# Patient Record
Sex: Female | Born: 1935 | Race: Black or African American | Hispanic: No | Smoking: Never smoker
Health system: Southern US, Community
[De-identification: ages and names within clinical notes are randomized; demographics above are authoritative.]

## PROBLEM LIST (undated history)

## (undated) DIAGNOSIS — J189 Pneumonia, unspecified organism: Secondary | ICD-10-CM

## (undated) DIAGNOSIS — E119 Type 2 diabetes mellitus without complications: Secondary | ICD-10-CM

## (undated) DIAGNOSIS — J9600 Acute respiratory failure, unspecified whether with hypoxia or hypercapnia: Secondary | ICD-10-CM

## (undated) DIAGNOSIS — T8092XA Unspecified transfusion reaction, initial encounter: Secondary | ICD-10-CM

## (undated) DIAGNOSIS — R519 Headache, unspecified: Secondary | ICD-10-CM

## (undated) DIAGNOSIS — R001 Bradycardia, unspecified: Secondary | ICD-10-CM

## (undated) DIAGNOSIS — I4892 Unspecified atrial flutter: Secondary | ICD-10-CM

## (undated) DIAGNOSIS — Z8719 Personal history of other diseases of the digestive system: Secondary | ICD-10-CM

## (undated) DIAGNOSIS — Z9981 Dependence on supplemental oxygen: Secondary | ICD-10-CM

## (undated) DIAGNOSIS — F329 Major depressive disorder, single episode, unspecified: Secondary | ICD-10-CM

## (undated) DIAGNOSIS — K56609 Unspecified intestinal obstruction, unspecified as to partial versus complete obstruction: Secondary | ICD-10-CM

## (undated) DIAGNOSIS — R0600 Dyspnea, unspecified: Secondary | ICD-10-CM

## (undated) DIAGNOSIS — N390 Urinary tract infection, site not specified: Secondary | ICD-10-CM

## (undated) DIAGNOSIS — J302 Other seasonal allergic rhinitis: Secondary | ICD-10-CM

## (undated) DIAGNOSIS — K922 Gastrointestinal hemorrhage, unspecified: Secondary | ICD-10-CM

## (undated) DIAGNOSIS — E222 Syndrome of inappropriate secretion of antidiuretic hormone: Secondary | ICD-10-CM

## (undated) DIAGNOSIS — K255 Chronic or unspecified gastric ulcer with perforation: Secondary | ICD-10-CM

## (undated) DIAGNOSIS — E039 Hypothyroidism, unspecified: Secondary | ICD-10-CM

## (undated) DIAGNOSIS — I82409 Acute embolism and thrombosis of unspecified deep veins of unspecified lower extremity: Secondary | ICD-10-CM

## (undated) DIAGNOSIS — N183 Chronic kidney disease, stage 3 unspecified: Secondary | ICD-10-CM

## (undated) DIAGNOSIS — K436 Other and unspecified ventral hernia with obstruction, without gangrene: Secondary | ICD-10-CM

## (undated) DIAGNOSIS — I480 Paroxysmal atrial fibrillation: Secondary | ICD-10-CM

## (undated) DIAGNOSIS — Z8616 Personal history of COVID-19: Secondary | ICD-10-CM

## (undated) DIAGNOSIS — M1990 Unspecified osteoarthritis, unspecified site: Secondary | ICD-10-CM

## (undated) DIAGNOSIS — J45909 Unspecified asthma, uncomplicated: Secondary | ICD-10-CM

## (undated) DIAGNOSIS — I739 Peripheral vascular disease, unspecified: Secondary | ICD-10-CM

## (undated) DIAGNOSIS — I2510 Atherosclerotic heart disease of native coronary artery without angina pectoris: Secondary | ICD-10-CM

## (undated) DIAGNOSIS — I5032 Chronic diastolic (congestive) heart failure: Secondary | ICD-10-CM

## (undated) DIAGNOSIS — I639 Cerebral infarction, unspecified: Secondary | ICD-10-CM

## (undated) DIAGNOSIS — D649 Anemia, unspecified: Secondary | ICD-10-CM

## (undated) DIAGNOSIS — F32A Depression, unspecified: Secondary | ICD-10-CM

## (undated) DIAGNOSIS — K449 Diaphragmatic hernia without obstruction or gangrene: Secondary | ICD-10-CM

## (undated) DIAGNOSIS — K3189 Other diseases of stomach and duodenum: Secondary | ICD-10-CM

## (undated) DIAGNOSIS — J449 Chronic obstructive pulmonary disease, unspecified: Secondary | ICD-10-CM

## (undated) DIAGNOSIS — K219 Gastro-esophageal reflux disease without esophagitis: Secondary | ICD-10-CM

## (undated) DIAGNOSIS — Z9289 Personal history of other medical treatment: Secondary | ICD-10-CM

## (undated) DIAGNOSIS — Z8711 Personal history of peptic ulcer disease: Secondary | ICD-10-CM

## (undated) DIAGNOSIS — E785 Hyperlipidemia, unspecified: Secondary | ICD-10-CM

## (undated) DIAGNOSIS — I10 Essential (primary) hypertension: Secondary | ICD-10-CM

## (undated) HISTORY — PX: COLECTOMY: SHX59

## (undated) HISTORY — DX: Paroxysmal atrial fibrillation: I48.0

## (undated) HISTORY — DX: Essential (primary) hypertension: I10

## (undated) HISTORY — DX: Cerebral infarction, unspecified: I63.9

## (undated) HISTORY — DX: Pneumonia, unspecified organism: J18.9

## (undated) HISTORY — DX: Hyperlipidemia, unspecified: E78.5

## (undated) HISTORY — DX: Chronic kidney disease, stage 3 unspecified: N18.30

## (undated) HISTORY — PX: FOOT SURGERY: SHX648

## (undated) HISTORY — DX: Diaphragmatic hernia without obstruction or gangrene: K44.9

## (undated) HISTORY — DX: Other diseases of stomach and duodenum: K31.89

## (undated) HISTORY — DX: Unspecified intestinal obstruction, unspecified as to partial versus complete obstruction: K56.609

## (undated) HISTORY — PX: CATARACT EXTRACTION W/ INTRAOCULAR LENS  IMPLANT, BILATERAL: SHX1307

## (undated) HISTORY — PX: GASTROJEJUNOSTOMY: SHX1697

## (undated) HISTORY — DX: Gastrointestinal hemorrhage, unspecified: K92.2

## (undated) HISTORY — DX: Acute respiratory failure, unspecified whether with hypoxia or hypercapnia: J96.00

## (undated) HISTORY — DX: Peripheral vascular disease, unspecified: I73.9

## (undated) HISTORY — PX: TONSILLECTOMY: SUR1361

## (undated) HISTORY — DX: Chronic kidney disease, stage 3 (moderate): N18.3

## (undated) HISTORY — PX: CHOLECYSTECTOMY OPEN: SUR202

## (undated) HISTORY — DX: Unspecified atrial flutter: I48.92

## (undated) HISTORY — DX: Atherosclerotic heart disease of native coronary artery without angina pectoris: I25.10

## (undated) HISTORY — DX: Urinary tract infection, site not specified: N39.0

## (undated) HISTORY — DX: Other and unspecified ventral hernia with obstruction, without gangrene: K43.6

## (undated) HISTORY — DX: Chronic diastolic (congestive) heart failure: I50.32

## (undated) HISTORY — PX: GLAUCOMA SURGERY: SHX656

## (undated) HISTORY — DX: Chronic or unspecified gastric ulcer with perforation: K25.5

## (undated) HISTORY — PX: TUBAL LIGATION: SHX77

## (undated) SURGERY — DEBRIDEMENT, WOUND
Anesthesia: Choice | Laterality: Bilateral

---

## 2008-04-03 LAB — HM COLONOSCOPY

## 2011-04-19 DIAGNOSIS — H40009 Preglaucoma, unspecified, unspecified eye: Secondary | ICD-10-CM | POA: Diagnosis not present

## 2011-04-19 DIAGNOSIS — H04129 Dry eye syndrome of unspecified lacrimal gland: Secondary | ICD-10-CM | POA: Diagnosis not present

## 2011-04-19 DIAGNOSIS — E119 Type 2 diabetes mellitus without complications: Secondary | ICD-10-CM | POA: Diagnosis not present

## 2011-04-19 DIAGNOSIS — H401390 Pigmentary glaucoma, unspecified eye, stage unspecified: Secondary | ICD-10-CM | POA: Diagnosis not present

## 2011-04-20 DIAGNOSIS — E1049 Type 1 diabetes mellitus with other diabetic neurological complication: Secondary | ICD-10-CM | POA: Diagnosis not present

## 2011-04-20 DIAGNOSIS — M25579 Pain in unspecified ankle and joints of unspecified foot: Secondary | ICD-10-CM | POA: Diagnosis not present

## 2011-04-20 DIAGNOSIS — M109 Gout, unspecified: Secondary | ICD-10-CM | POA: Diagnosis not present

## 2011-04-20 DIAGNOSIS — L97509 Non-pressure chronic ulcer of other part of unspecified foot with unspecified severity: Secondary | ICD-10-CM | POA: Diagnosis not present

## 2011-04-27 DIAGNOSIS — H401390 Pigmentary glaucoma, unspecified eye, stage unspecified: Secondary | ICD-10-CM | POA: Diagnosis not present

## 2011-05-10 DIAGNOSIS — I10 Essential (primary) hypertension: Secondary | ICD-10-CM | POA: Diagnosis not present

## 2011-05-10 DIAGNOSIS — E782 Mixed hyperlipidemia: Secondary | ICD-10-CM | POA: Diagnosis not present

## 2011-05-10 DIAGNOSIS — E119 Type 2 diabetes mellitus without complications: Secondary | ICD-10-CM | POA: Diagnosis not present

## 2011-05-10 DIAGNOSIS — I2510 Atherosclerotic heart disease of native coronary artery without angina pectoris: Secondary | ICD-10-CM | POA: Diagnosis not present

## 2011-05-11 DIAGNOSIS — Z79899 Other long term (current) drug therapy: Secondary | ICD-10-CM | POA: Diagnosis not present

## 2011-05-11 DIAGNOSIS — R5381 Other malaise: Secondary | ICD-10-CM | POA: Diagnosis not present

## 2011-05-11 DIAGNOSIS — E559 Vitamin D deficiency, unspecified: Secondary | ICD-10-CM | POA: Diagnosis not present

## 2011-05-11 DIAGNOSIS — D649 Anemia, unspecified: Secondary | ICD-10-CM | POA: Diagnosis not present

## 2011-05-11 DIAGNOSIS — E119 Type 2 diabetes mellitus without complications: Secondary | ICD-10-CM | POA: Diagnosis not present

## 2011-05-11 DIAGNOSIS — E782 Mixed hyperlipidemia: Secondary | ICD-10-CM | POA: Diagnosis not present

## 2011-05-11 DIAGNOSIS — I10 Essential (primary) hypertension: Secondary | ICD-10-CM | POA: Diagnosis not present

## 2011-06-19 DIAGNOSIS — N183 Chronic kidney disease, stage 3 unspecified: Secondary | ICD-10-CM | POA: Diagnosis not present

## 2011-06-19 DIAGNOSIS — I120 Hypertensive chronic kidney disease with stage 5 chronic kidney disease or end stage renal disease: Secondary | ICD-10-CM | POA: Diagnosis not present

## 2011-06-19 DIAGNOSIS — N250 Renal osteodystrophy: Secondary | ICD-10-CM | POA: Diagnosis not present

## 2011-06-19 DIAGNOSIS — E119 Type 2 diabetes mellitus without complications: Secondary | ICD-10-CM | POA: Diagnosis not present

## 2011-06-29 DIAGNOSIS — D531 Other megaloblastic anemias, not elsewhere classified: Secondary | ICD-10-CM | POA: Diagnosis not present

## 2011-07-25 DIAGNOSIS — I2510 Atherosclerotic heart disease of native coronary artery without angina pectoris: Secondary | ICD-10-CM | POA: Diagnosis not present

## 2011-07-27 DIAGNOSIS — E1149 Type 2 diabetes mellitus with other diabetic neurological complication: Secondary | ICD-10-CM | POA: Diagnosis not present

## 2011-07-27 DIAGNOSIS — I739 Peripheral vascular disease, unspecified: Secondary | ICD-10-CM | POA: Diagnosis not present

## 2011-07-27 DIAGNOSIS — R569 Unspecified convulsions: Secondary | ICD-10-CM | POA: Diagnosis not present

## 2011-07-27 DIAGNOSIS — G909 Disorder of the autonomic nervous system, unspecified: Secondary | ICD-10-CM | POA: Diagnosis not present

## 2011-08-03 DIAGNOSIS — R82998 Other abnormal findings in urine: Secondary | ICD-10-CM | POA: Diagnosis not present

## 2011-08-03 DIAGNOSIS — Z9189 Other specified personal risk factors, not elsewhere classified: Secondary | ICD-10-CM | POA: Diagnosis not present

## 2011-08-03 DIAGNOSIS — Z124 Encounter for screening for malignant neoplasm of cervix: Secondary | ICD-10-CM | POA: Diagnosis not present

## 2011-08-03 DIAGNOSIS — N951 Menopausal and female climacteric states: Secondary | ICD-10-CM | POA: Diagnosis not present

## 2011-08-03 DIAGNOSIS — Z1212 Encounter for screening for malignant neoplasm of rectum: Secondary | ICD-10-CM | POA: Diagnosis not present

## 2011-08-09 DIAGNOSIS — I2510 Atherosclerotic heart disease of native coronary artery without angina pectoris: Secondary | ICD-10-CM | POA: Diagnosis not present

## 2011-08-09 DIAGNOSIS — E119 Type 2 diabetes mellitus without complications: Secondary | ICD-10-CM | POA: Diagnosis not present

## 2011-08-09 DIAGNOSIS — E782 Mixed hyperlipidemia: Secondary | ICD-10-CM | POA: Diagnosis not present

## 2011-08-09 DIAGNOSIS — I10 Essential (primary) hypertension: Secondary | ICD-10-CM | POA: Diagnosis not present

## 2011-08-10 DIAGNOSIS — E559 Vitamin D deficiency, unspecified: Secondary | ICD-10-CM | POA: Diagnosis not present

## 2011-08-10 DIAGNOSIS — D649 Anemia, unspecified: Secondary | ICD-10-CM | POA: Diagnosis not present

## 2011-08-10 DIAGNOSIS — E119 Type 2 diabetes mellitus without complications: Secondary | ICD-10-CM | POA: Diagnosis not present

## 2011-08-10 DIAGNOSIS — I10 Essential (primary) hypertension: Secondary | ICD-10-CM | POA: Diagnosis not present

## 2011-08-10 DIAGNOSIS — E782 Mixed hyperlipidemia: Secondary | ICD-10-CM | POA: Diagnosis not present

## 2011-08-10 DIAGNOSIS — R569 Unspecified convulsions: Secondary | ICD-10-CM | POA: Diagnosis not present

## 2011-08-10 DIAGNOSIS — Z79899 Other long term (current) drug therapy: Secondary | ICD-10-CM | POA: Diagnosis not present

## 2011-08-29 DIAGNOSIS — Z1231 Encounter for screening mammogram for malignant neoplasm of breast: Secondary | ICD-10-CM | POA: Diagnosis not present

## 2011-08-31 DIAGNOSIS — N951 Menopausal and female climacteric states: Secondary | ICD-10-CM | POA: Diagnosis not present

## 2011-08-31 DIAGNOSIS — M949 Disorder of cartilage, unspecified: Secondary | ICD-10-CM | POA: Diagnosis not present

## 2011-08-31 DIAGNOSIS — M899 Disorder of bone, unspecified: Secondary | ICD-10-CM | POA: Diagnosis not present

## 2011-09-28 DIAGNOSIS — E1142 Type 2 diabetes mellitus with diabetic polyneuropathy: Secondary | ICD-10-CM | POA: Diagnosis not present

## 2011-09-28 DIAGNOSIS — E1049 Type 1 diabetes mellitus with other diabetic neurological complication: Secondary | ICD-10-CM | POA: Diagnosis not present

## 2011-09-28 DIAGNOSIS — M2040 Other hammer toe(s) (acquired), unspecified foot: Secondary | ICD-10-CM | POA: Diagnosis not present

## 2011-09-28 DIAGNOSIS — L738 Other specified follicular disorders: Secondary | ICD-10-CM | POA: Diagnosis not present

## 2011-10-24 DIAGNOSIS — L97509 Non-pressure chronic ulcer of other part of unspecified foot with unspecified severity: Secondary | ICD-10-CM | POA: Diagnosis not present

## 2011-10-24 DIAGNOSIS — E1049 Type 1 diabetes mellitus with other diabetic neurological complication: Secondary | ICD-10-CM | POA: Diagnosis not present

## 2011-10-24 DIAGNOSIS — M79609 Pain in unspecified limb: Secondary | ICD-10-CM | POA: Diagnosis not present

## 2011-10-24 DIAGNOSIS — M86179 Other acute osteomyelitis, unspecified ankle and foot: Secondary | ICD-10-CM | POA: Diagnosis not present

## 2011-10-26 DIAGNOSIS — H4010X0 Unspecified open-angle glaucoma, stage unspecified: Secondary | ICD-10-CM | POA: Diagnosis not present

## 2011-10-26 DIAGNOSIS — H4320 Crystalline deposits in vitreous body, unspecified eye: Secondary | ICD-10-CM | POA: Diagnosis not present

## 2011-10-26 DIAGNOSIS — H04129 Dry eye syndrome of unspecified lacrimal gland: Secondary | ICD-10-CM | POA: Diagnosis not present

## 2011-10-26 DIAGNOSIS — E119 Type 2 diabetes mellitus without complications: Secondary | ICD-10-CM | POA: Diagnosis not present

## 2011-10-26 LAB — HM DEXA SCAN

## 2011-10-27 DIAGNOSIS — M86179 Other acute osteomyelitis, unspecified ankle and foot: Secondary | ICD-10-CM | POA: Diagnosis not present

## 2011-10-27 DIAGNOSIS — E1049 Type 1 diabetes mellitus with other diabetic neurological complication: Secondary | ICD-10-CM | POA: Diagnosis not present

## 2011-10-27 DIAGNOSIS — L97509 Non-pressure chronic ulcer of other part of unspecified foot with unspecified severity: Secondary | ICD-10-CM | POA: Diagnosis not present

## 2011-10-31 DIAGNOSIS — E1049 Type 1 diabetes mellitus with other diabetic neurological complication: Secondary | ICD-10-CM | POA: Diagnosis not present

## 2011-10-31 DIAGNOSIS — L97509 Non-pressure chronic ulcer of other part of unspecified foot with unspecified severity: Secondary | ICD-10-CM | POA: Diagnosis not present

## 2011-10-31 DIAGNOSIS — I798 Other disorders of arteries, arterioles and capillaries in diseases classified elsewhere: Secondary | ICD-10-CM | POA: Diagnosis not present

## 2011-11-09 DIAGNOSIS — E782 Mixed hyperlipidemia: Secondary | ICD-10-CM | POA: Diagnosis not present

## 2011-11-09 DIAGNOSIS — I10 Essential (primary) hypertension: Secondary | ICD-10-CM | POA: Diagnosis not present

## 2011-11-09 DIAGNOSIS — I2510 Atherosclerotic heart disease of native coronary artery without angina pectoris: Secondary | ICD-10-CM | POA: Diagnosis not present

## 2011-11-09 DIAGNOSIS — E119 Type 2 diabetes mellitus without complications: Secondary | ICD-10-CM | POA: Diagnosis not present

## 2011-11-13 DIAGNOSIS — E039 Hypothyroidism, unspecified: Secondary | ICD-10-CM | POA: Diagnosis not present

## 2011-11-13 DIAGNOSIS — E559 Vitamin D deficiency, unspecified: Secondary | ICD-10-CM | POA: Diagnosis not present

## 2011-11-13 DIAGNOSIS — E119 Type 2 diabetes mellitus without complications: Secondary | ICD-10-CM | POA: Diagnosis not present

## 2011-11-13 DIAGNOSIS — E782 Mixed hyperlipidemia: Secondary | ICD-10-CM | POA: Diagnosis not present

## 2011-11-13 DIAGNOSIS — Z79899 Other long term (current) drug therapy: Secondary | ICD-10-CM | POA: Diagnosis not present

## 2011-11-13 DIAGNOSIS — I10 Essential (primary) hypertension: Secondary | ICD-10-CM | POA: Diagnosis not present

## 2011-11-13 DIAGNOSIS — D649 Anemia, unspecified: Secondary | ICD-10-CM | POA: Diagnosis not present

## 2011-11-21 DIAGNOSIS — M2040 Other hammer toe(s) (acquired), unspecified foot: Secondary | ICD-10-CM | POA: Diagnosis not present

## 2011-11-21 DIAGNOSIS — L97509 Non-pressure chronic ulcer of other part of unspecified foot with unspecified severity: Secondary | ICD-10-CM | POA: Diagnosis not present

## 2011-11-21 DIAGNOSIS — E1142 Type 2 diabetes mellitus with diabetic polyneuropathy: Secondary | ICD-10-CM | POA: Diagnosis not present

## 2011-11-21 DIAGNOSIS — E1049 Type 1 diabetes mellitus with other diabetic neurological complication: Secondary | ICD-10-CM | POA: Diagnosis not present

## 2011-12-07 DIAGNOSIS — E875 Hyperkalemia: Secondary | ICD-10-CM | POA: Diagnosis not present

## 2011-12-07 DIAGNOSIS — N183 Chronic kidney disease, stage 3 unspecified: Secondary | ICD-10-CM | POA: Diagnosis not present

## 2011-12-14 DIAGNOSIS — M25579 Pain in unspecified ankle and joints of unspecified foot: Secondary | ICD-10-CM | POA: Diagnosis not present

## 2011-12-14 DIAGNOSIS — M2040 Other hammer toe(s) (acquired), unspecified foot: Secondary | ICD-10-CM | POA: Diagnosis not present

## 2011-12-14 DIAGNOSIS — E1049 Type 1 diabetes mellitus with other diabetic neurological complication: Secondary | ICD-10-CM | POA: Diagnosis not present

## 2011-12-14 DIAGNOSIS — L988 Other specified disorders of the skin and subcutaneous tissue: Secondary | ICD-10-CM | POA: Diagnosis not present

## 2011-12-28 DIAGNOSIS — D531 Other megaloblastic anemias, not elsewhere classified: Secondary | ICD-10-CM | POA: Diagnosis not present

## 2012-01-16 DIAGNOSIS — H40019 Open angle with borderline findings, low risk, unspecified eye: Secondary | ICD-10-CM | POA: Diagnosis not present

## 2012-01-16 DIAGNOSIS — H04129 Dry eye syndrome of unspecified lacrimal gland: Secondary | ICD-10-CM | POA: Diagnosis not present

## 2012-01-16 DIAGNOSIS — H4010X0 Unspecified open-angle glaucoma, stage unspecified: Secondary | ICD-10-CM | POA: Diagnosis not present

## 2012-01-25 DIAGNOSIS — R569 Unspecified convulsions: Secondary | ICD-10-CM | POA: Diagnosis not present

## 2012-01-25 DIAGNOSIS — H40019 Open angle with borderline findings, low risk, unspecified eye: Secondary | ICD-10-CM | POA: Diagnosis not present

## 2012-01-25 DIAGNOSIS — G909 Disorder of the autonomic nervous system, unspecified: Secondary | ICD-10-CM | POA: Diagnosis not present

## 2012-01-25 DIAGNOSIS — E1149 Type 2 diabetes mellitus with other diabetic neurological complication: Secondary | ICD-10-CM | POA: Diagnosis not present

## 2012-01-25 DIAGNOSIS — H4010X0 Unspecified open-angle glaucoma, stage unspecified: Secondary | ICD-10-CM | POA: Diagnosis not present

## 2012-01-29 DIAGNOSIS — I2510 Atherosclerotic heart disease of native coronary artery without angina pectoris: Secondary | ICD-10-CM | POA: Diagnosis not present

## 2012-02-13 DIAGNOSIS — M779 Enthesopathy, unspecified: Secondary | ICD-10-CM | POA: Diagnosis not present

## 2012-02-13 DIAGNOSIS — E1049 Type 1 diabetes mellitus with other diabetic neurological complication: Secondary | ICD-10-CM | POA: Diagnosis not present

## 2012-02-13 DIAGNOSIS — M25579 Pain in unspecified ankle and joints of unspecified foot: Secondary | ICD-10-CM | POA: Diagnosis not present

## 2012-02-13 DIAGNOSIS — M109 Gout, unspecified: Secondary | ICD-10-CM | POA: Diagnosis not present

## 2012-02-20 DIAGNOSIS — Z23 Encounter for immunization: Secondary | ICD-10-CM | POA: Diagnosis not present

## 2012-02-20 DIAGNOSIS — Z1331 Encounter for screening for depression: Secondary | ICD-10-CM | POA: Diagnosis not present

## 2012-02-20 DIAGNOSIS — I10 Essential (primary) hypertension: Secondary | ICD-10-CM | POA: Diagnosis not present

## 2012-02-20 DIAGNOSIS — Z0000 Encounter for general adult medical examination without abnormal findings: Secondary | ICD-10-CM | POA: Diagnosis not present

## 2012-02-20 DIAGNOSIS — Z1339 Encounter for screening examination for other mental health and behavioral disorders: Secondary | ICD-10-CM | POA: Diagnosis not present

## 2012-02-27 DIAGNOSIS — I10 Essential (primary) hypertension: Secondary | ICD-10-CM | POA: Diagnosis not present

## 2012-02-27 DIAGNOSIS — Z79899 Other long term (current) drug therapy: Secondary | ICD-10-CM | POA: Diagnosis not present

## 2012-02-27 DIAGNOSIS — D649 Anemia, unspecified: Secondary | ICD-10-CM | POA: Diagnosis not present

## 2012-02-27 DIAGNOSIS — E119 Type 2 diabetes mellitus without complications: Secondary | ICD-10-CM | POA: Diagnosis not present

## 2012-02-27 DIAGNOSIS — E559 Vitamin D deficiency, unspecified: Secondary | ICD-10-CM | POA: Diagnosis not present

## 2012-02-27 DIAGNOSIS — E039 Hypothyroidism, unspecified: Secondary | ICD-10-CM | POA: Diagnosis not present

## 2012-02-27 DIAGNOSIS — E782 Mixed hyperlipidemia: Secondary | ICD-10-CM | POA: Diagnosis not present

## 2012-02-28 DIAGNOSIS — H04129 Dry eye syndrome of unspecified lacrimal gland: Secondary | ICD-10-CM | POA: Diagnosis not present

## 2012-02-28 DIAGNOSIS — H4010X0 Unspecified open-angle glaucoma, stage unspecified: Secondary | ICD-10-CM | POA: Diagnosis not present

## 2012-02-28 DIAGNOSIS — H02139 Senile ectropion of unspecified eye, unspecified eyelid: Secondary | ICD-10-CM | POA: Diagnosis not present

## 2012-02-28 DIAGNOSIS — H16149 Punctate keratitis, unspecified eye: Secondary | ICD-10-CM | POA: Diagnosis not present

## 2012-04-18 DIAGNOSIS — E1049 Type 1 diabetes mellitus with other diabetic neurological complication: Secondary | ICD-10-CM | POA: Diagnosis not present

## 2012-04-18 DIAGNOSIS — M779 Enthesopathy, unspecified: Secondary | ICD-10-CM | POA: Diagnosis not present

## 2012-04-18 DIAGNOSIS — M25579 Pain in unspecified ankle and joints of unspecified foot: Secondary | ICD-10-CM | POA: Diagnosis not present

## 2012-04-18 DIAGNOSIS — M2040 Other hammer toe(s) (acquired), unspecified foot: Secondary | ICD-10-CM | POA: Diagnosis not present

## 2012-04-24 DIAGNOSIS — H4010X0 Unspecified open-angle glaucoma, stage unspecified: Secondary | ICD-10-CM | POA: Diagnosis not present

## 2012-04-24 DIAGNOSIS — H04129 Dry eye syndrome of unspecified lacrimal gland: Secondary | ICD-10-CM | POA: Diagnosis not present

## 2012-05-29 DIAGNOSIS — H04129 Dry eye syndrome of unspecified lacrimal gland: Secondary | ICD-10-CM | POA: Diagnosis not present

## 2012-05-30 DIAGNOSIS — E119 Type 2 diabetes mellitus without complications: Secondary | ICD-10-CM | POA: Diagnosis not present

## 2012-05-30 DIAGNOSIS — I2510 Atherosclerotic heart disease of native coronary artery without angina pectoris: Secondary | ICD-10-CM | POA: Diagnosis not present

## 2012-05-30 DIAGNOSIS — I10 Essential (primary) hypertension: Secondary | ICD-10-CM | POA: Diagnosis not present

## 2012-05-30 DIAGNOSIS — E782 Mixed hyperlipidemia: Secondary | ICD-10-CM | POA: Diagnosis not present

## 2012-05-31 DIAGNOSIS — Z79899 Other long term (current) drug therapy: Secondary | ICD-10-CM | POA: Diagnosis not present

## 2012-05-31 DIAGNOSIS — D649 Anemia, unspecified: Secondary | ICD-10-CM | POA: Diagnosis not present

## 2012-05-31 DIAGNOSIS — E559 Vitamin D deficiency, unspecified: Secondary | ICD-10-CM | POA: Diagnosis not present

## 2012-05-31 DIAGNOSIS — E782 Mixed hyperlipidemia: Secondary | ICD-10-CM | POA: Diagnosis not present

## 2012-05-31 DIAGNOSIS — I10 Essential (primary) hypertension: Secondary | ICD-10-CM | POA: Diagnosis not present

## 2012-05-31 DIAGNOSIS — E119 Type 2 diabetes mellitus without complications: Secondary | ICD-10-CM | POA: Diagnosis not present

## 2012-06-13 DIAGNOSIS — I10 Essential (primary) hypertension: Secondary | ICD-10-CM | POA: Diagnosis not present

## 2012-06-13 DIAGNOSIS — I2510 Atherosclerotic heart disease of native coronary artery without angina pectoris: Secondary | ICD-10-CM | POA: Diagnosis not present

## 2012-06-13 DIAGNOSIS — E782 Mixed hyperlipidemia: Secondary | ICD-10-CM | POA: Diagnosis not present

## 2012-06-13 DIAGNOSIS — E119 Type 2 diabetes mellitus without complications: Secondary | ICD-10-CM | POA: Diagnosis not present

## 2012-06-14 DIAGNOSIS — N183 Chronic kidney disease, stage 3 unspecified: Secondary | ICD-10-CM | POA: Diagnosis not present

## 2012-06-14 DIAGNOSIS — N250 Renal osteodystrophy: Secondary | ICD-10-CM | POA: Diagnosis not present

## 2012-06-14 DIAGNOSIS — I120 Hypertensive chronic kidney disease with stage 5 chronic kidney disease or end stage renal disease: Secondary | ICD-10-CM | POA: Diagnosis not present

## 2012-06-14 DIAGNOSIS — E119 Type 2 diabetes mellitus without complications: Secondary | ICD-10-CM | POA: Diagnosis not present

## 2012-06-20 DIAGNOSIS — M25579 Pain in unspecified ankle and joints of unspecified foot: Secondary | ICD-10-CM | POA: Diagnosis not present

## 2012-06-20 DIAGNOSIS — E1049 Type 1 diabetes mellitus with other diabetic neurological complication: Secondary | ICD-10-CM | POA: Diagnosis not present

## 2012-06-20 DIAGNOSIS — E1142 Type 2 diabetes mellitus with diabetic polyneuropathy: Secondary | ICD-10-CM | POA: Diagnosis not present

## 2012-06-20 DIAGNOSIS — IMO0002 Reserved for concepts with insufficient information to code with codable children: Secondary | ICD-10-CM | POA: Diagnosis not present

## 2012-06-25 DIAGNOSIS — I499 Cardiac arrhythmia, unspecified: Secondary | ICD-10-CM | POA: Diagnosis not present

## 2012-06-25 DIAGNOSIS — R0609 Other forms of dyspnea: Secondary | ICD-10-CM | POA: Diagnosis not present

## 2012-06-25 DIAGNOSIS — I2510 Atherosclerotic heart disease of native coronary artery without angina pectoris: Secondary | ICD-10-CM | POA: Diagnosis not present

## 2012-06-26 DIAGNOSIS — H04129 Dry eye syndrome of unspecified lacrimal gland: Secondary | ICD-10-CM | POA: Diagnosis not present

## 2012-06-26 DIAGNOSIS — H4010X0 Unspecified open-angle glaucoma, stage unspecified: Secondary | ICD-10-CM | POA: Diagnosis not present

## 2012-06-27 DIAGNOSIS — D531 Other megaloblastic anemias, not elsewhere classified: Secondary | ICD-10-CM | POA: Diagnosis not present

## 2012-06-28 DIAGNOSIS — D531 Other megaloblastic anemias, not elsewhere classified: Secondary | ICD-10-CM | POA: Diagnosis not present

## 2012-07-04 DIAGNOSIS — M1990 Unspecified osteoarthritis, unspecified site: Secondary | ICD-10-CM | POA: Diagnosis not present

## 2012-07-04 DIAGNOSIS — G909 Disorder of the autonomic nervous system, unspecified: Secondary | ICD-10-CM | POA: Diagnosis not present

## 2012-07-04 DIAGNOSIS — R569 Unspecified convulsions: Secondary | ICD-10-CM | POA: Diagnosis not present

## 2012-07-04 DIAGNOSIS — E1149 Type 2 diabetes mellitus with other diabetic neurological complication: Secondary | ICD-10-CM | POA: Diagnosis not present

## 2012-08-07 DIAGNOSIS — M899 Disorder of bone, unspecified: Secondary | ICD-10-CM | POA: Diagnosis not present

## 2012-08-07 DIAGNOSIS — M949 Disorder of cartilage, unspecified: Secondary | ICD-10-CM | POA: Diagnosis not present

## 2012-08-07 DIAGNOSIS — R82998 Other abnormal findings in urine: Secondary | ICD-10-CM | POA: Diagnosis not present

## 2012-08-07 DIAGNOSIS — Z1212 Encounter for screening for malignant neoplasm of rectum: Secondary | ICD-10-CM | POA: Diagnosis not present

## 2012-08-07 DIAGNOSIS — N951 Menopausal and female climacteric states: Secondary | ICD-10-CM | POA: Diagnosis not present

## 2012-09-02 DIAGNOSIS — R072 Precordial pain: Secondary | ICD-10-CM | POA: Diagnosis not present

## 2012-09-02 DIAGNOSIS — I059 Rheumatic mitral valve disease, unspecified: Secondary | ICD-10-CM | POA: Diagnosis not present

## 2012-09-02 DIAGNOSIS — I2510 Atherosclerotic heart disease of native coronary artery without angina pectoris: Secondary | ICD-10-CM | POA: Diagnosis not present

## 2012-09-10 DIAGNOSIS — I2510 Atherosclerotic heart disease of native coronary artery without angina pectoris: Secondary | ICD-10-CM | POA: Diagnosis not present

## 2012-09-10 DIAGNOSIS — E119 Type 2 diabetes mellitus without complications: Secondary | ICD-10-CM | POA: Diagnosis not present

## 2012-09-10 DIAGNOSIS — E782 Mixed hyperlipidemia: Secondary | ICD-10-CM | POA: Diagnosis not present

## 2012-09-10 DIAGNOSIS — I10 Essential (primary) hypertension: Secondary | ICD-10-CM | POA: Diagnosis not present

## 2012-09-11 DIAGNOSIS — E782 Mixed hyperlipidemia: Secondary | ICD-10-CM | POA: Diagnosis not present

## 2012-09-11 DIAGNOSIS — D649 Anemia, unspecified: Secondary | ICD-10-CM | POA: Diagnosis not present

## 2012-09-11 DIAGNOSIS — E559 Vitamin D deficiency, unspecified: Secondary | ICD-10-CM | POA: Diagnosis not present

## 2012-09-11 DIAGNOSIS — E119 Type 2 diabetes mellitus without complications: Secondary | ICD-10-CM | POA: Diagnosis not present

## 2012-09-11 DIAGNOSIS — E039 Hypothyroidism, unspecified: Secondary | ICD-10-CM | POA: Diagnosis not present

## 2012-09-11 DIAGNOSIS — Z79899 Other long term (current) drug therapy: Secondary | ICD-10-CM | POA: Diagnosis not present

## 2012-09-11 DIAGNOSIS — I10 Essential (primary) hypertension: Secondary | ICD-10-CM | POA: Diagnosis not present

## 2012-09-12 DIAGNOSIS — M25579 Pain in unspecified ankle and joints of unspecified foot: Secondary | ICD-10-CM | POA: Diagnosis not present

## 2012-09-12 DIAGNOSIS — IMO0002 Reserved for concepts with insufficient information to code with codable children: Secondary | ICD-10-CM | POA: Diagnosis not present

## 2012-09-12 DIAGNOSIS — E1142 Type 2 diabetes mellitus with diabetic polyneuropathy: Secondary | ICD-10-CM | POA: Diagnosis not present

## 2012-09-12 DIAGNOSIS — E1049 Type 1 diabetes mellitus with other diabetic neurological complication: Secondary | ICD-10-CM | POA: Diagnosis not present

## 2012-09-21 DIAGNOSIS — Z1231 Encounter for screening mammogram for malignant neoplasm of breast: Secondary | ICD-10-CM | POA: Diagnosis not present

## 2012-10-03 DIAGNOSIS — I2510 Atherosclerotic heart disease of native coronary artery without angina pectoris: Secondary | ICD-10-CM | POA: Diagnosis not present

## 2012-10-03 DIAGNOSIS — R072 Precordial pain: Secondary | ICD-10-CM | POA: Diagnosis not present

## 2012-10-03 DIAGNOSIS — R011 Cardiac murmur, unspecified: Secondary | ICD-10-CM | POA: Diagnosis not present

## 2012-10-09 DIAGNOSIS — E782 Mixed hyperlipidemia: Secondary | ICD-10-CM | POA: Diagnosis not present

## 2012-10-09 DIAGNOSIS — K117 Disturbances of salivary secretion: Secondary | ICD-10-CM | POA: Diagnosis not present

## 2012-10-09 DIAGNOSIS — I10 Essential (primary) hypertension: Secondary | ICD-10-CM | POA: Diagnosis not present

## 2012-10-09 DIAGNOSIS — E119 Type 2 diabetes mellitus without complications: Secondary | ICD-10-CM | POA: Diagnosis not present

## 2012-10-15 DIAGNOSIS — I2510 Atherosclerotic heart disease of native coronary artery without angina pectoris: Secondary | ICD-10-CM | POA: Diagnosis not present

## 2012-10-24 DIAGNOSIS — H04129 Dry eye syndrome of unspecified lacrimal gland: Secondary | ICD-10-CM | POA: Diagnosis not present

## 2012-10-24 DIAGNOSIS — H401290 Low-tension glaucoma, unspecified eye, stage unspecified: Secondary | ICD-10-CM | POA: Diagnosis not present

## 2012-10-25 LAB — HM MAMMOGRAPHY

## 2012-11-07 DIAGNOSIS — H4010X0 Unspecified open-angle glaucoma, stage unspecified: Secondary | ICD-10-CM | POA: Diagnosis not present

## 2012-11-11 DIAGNOSIS — R072 Precordial pain: Secondary | ICD-10-CM | POA: Diagnosis not present

## 2012-11-11 DIAGNOSIS — I2510 Atherosclerotic heart disease of native coronary artery without angina pectoris: Secondary | ICD-10-CM | POA: Diagnosis not present

## 2012-11-11 DIAGNOSIS — I059 Rheumatic mitral valve disease, unspecified: Secondary | ICD-10-CM | POA: Diagnosis not present

## 2012-11-26 DIAGNOSIS — M25579 Pain in unspecified ankle and joints of unspecified foot: Secondary | ICD-10-CM | POA: Diagnosis not present

## 2012-11-26 DIAGNOSIS — M069 Rheumatoid arthritis, unspecified: Secondary | ICD-10-CM | POA: Diagnosis not present

## 2012-11-26 DIAGNOSIS — E1059 Type 1 diabetes mellitus with other circulatory complications: Secondary | ICD-10-CM | POA: Diagnosis not present

## 2012-12-12 DIAGNOSIS — E1149 Type 2 diabetes mellitus with other diabetic neurological complication: Secondary | ICD-10-CM | POA: Diagnosis not present

## 2012-12-12 DIAGNOSIS — M1990 Unspecified osteoarthritis, unspecified site: Secondary | ICD-10-CM | POA: Diagnosis not present

## 2012-12-12 DIAGNOSIS — R569 Unspecified convulsions: Secondary | ICD-10-CM | POA: Diagnosis not present

## 2012-12-12 DIAGNOSIS — G909 Disorder of the autonomic nervous system, unspecified: Secondary | ICD-10-CM | POA: Diagnosis not present

## 2012-12-14 DIAGNOSIS — K5730 Diverticulosis of large intestine without perforation or abscess without bleeding: Secondary | ICD-10-CM | POA: Diagnosis not present

## 2012-12-14 DIAGNOSIS — R109 Unspecified abdominal pain: Secondary | ICD-10-CM | POA: Diagnosis not present

## 2012-12-14 DIAGNOSIS — R110 Nausea: Secondary | ICD-10-CM | POA: Diagnosis not present

## 2012-12-14 DIAGNOSIS — R1013 Epigastric pain: Secondary | ICD-10-CM | POA: Diagnosis not present

## 2012-12-14 DIAGNOSIS — K439 Ventral hernia without obstruction or gangrene: Secondary | ICD-10-CM | POA: Diagnosis not present

## 2012-12-14 DIAGNOSIS — R079 Chest pain, unspecified: Secondary | ICD-10-CM | POA: Diagnosis not present

## 2012-12-18 DIAGNOSIS — H401290 Low-tension glaucoma, unspecified eye, stage unspecified: Secondary | ICD-10-CM | POA: Diagnosis not present

## 2012-12-18 DIAGNOSIS — H04129 Dry eye syndrome of unspecified lacrimal gland: Secondary | ICD-10-CM | POA: Diagnosis not present

## 2012-12-19 DIAGNOSIS — E119 Type 2 diabetes mellitus without complications: Secondary | ICD-10-CM | POA: Diagnosis not present

## 2012-12-19 DIAGNOSIS — I10 Essential (primary) hypertension: Secondary | ICD-10-CM | POA: Diagnosis not present

## 2012-12-19 DIAGNOSIS — K117 Disturbances of salivary secretion: Secondary | ICD-10-CM | POA: Diagnosis not present

## 2012-12-19 DIAGNOSIS — Z23 Encounter for immunization: Secondary | ICD-10-CM | POA: Diagnosis not present

## 2012-12-20 DIAGNOSIS — Z79899 Other long term (current) drug therapy: Secondary | ICD-10-CM | POA: Diagnosis not present

## 2012-12-20 DIAGNOSIS — E039 Hypothyroidism, unspecified: Secondary | ICD-10-CM | POA: Diagnosis not present

## 2012-12-20 DIAGNOSIS — R569 Unspecified convulsions: Secondary | ICD-10-CM | POA: Diagnosis not present

## 2012-12-20 DIAGNOSIS — D649 Anemia, unspecified: Secondary | ICD-10-CM | POA: Diagnosis not present

## 2012-12-20 DIAGNOSIS — E782 Mixed hyperlipidemia: Secondary | ICD-10-CM | POA: Diagnosis not present

## 2012-12-20 DIAGNOSIS — E559 Vitamin D deficiency, unspecified: Secondary | ICD-10-CM | POA: Diagnosis not present

## 2012-12-20 DIAGNOSIS — E119 Type 2 diabetes mellitus without complications: Secondary | ICD-10-CM | POA: Diagnosis not present

## 2013-01-21 DIAGNOSIS — M25579 Pain in unspecified ankle and joints of unspecified foot: Secondary | ICD-10-CM | POA: Diagnosis not present

## 2013-01-21 DIAGNOSIS — D539 Nutritional anemia, unspecified: Secondary | ICD-10-CM | POA: Diagnosis not present

## 2013-01-21 DIAGNOSIS — K922 Gastrointestinal hemorrhage, unspecified: Secondary | ICD-10-CM | POA: Diagnosis not present

## 2013-01-21 DIAGNOSIS — G40909 Epilepsy, unspecified, not intractable, without status epilepticus: Secondary | ICD-10-CM | POA: Diagnosis not present

## 2013-01-21 DIAGNOSIS — E1049 Type 1 diabetes mellitus with other diabetic neurological complication: Secondary | ICD-10-CM | POA: Diagnosis not present

## 2013-01-21 DIAGNOSIS — N189 Chronic kidney disease, unspecified: Secondary | ICD-10-CM | POA: Diagnosis not present

## 2013-01-21 DIAGNOSIS — E039 Hypothyroidism, unspecified: Secondary | ICD-10-CM | POA: Diagnosis not present

## 2013-01-21 DIAGNOSIS — Z9049 Acquired absence of other specified parts of digestive tract: Secondary | ICD-10-CM | POA: Diagnosis not present

## 2013-01-21 DIAGNOSIS — M2040 Other hammer toe(s) (acquired), unspecified foot: Secondary | ICD-10-CM | POA: Diagnosis not present

## 2013-01-21 DIAGNOSIS — D500 Iron deficiency anemia secondary to blood loss (chronic): Secondary | ICD-10-CM | POA: Diagnosis not present

## 2013-01-21 DIAGNOSIS — R269 Unspecified abnormalities of gait and mobility: Secondary | ICD-10-CM | POA: Diagnosis not present

## 2013-01-21 DIAGNOSIS — D531 Other megaloblastic anemias, not elsewhere classified: Secondary | ICD-10-CM | POA: Diagnosis not present

## 2013-01-21 DIAGNOSIS — Z9089 Acquired absence of other organs: Secondary | ICD-10-CM | POA: Diagnosis not present

## 2013-01-21 DIAGNOSIS — I129 Hypertensive chronic kidney disease with stage 1 through stage 4 chronic kidney disease, or unspecified chronic kidney disease: Secondary | ICD-10-CM | POA: Diagnosis not present

## 2013-01-21 DIAGNOSIS — Z8711 Personal history of peptic ulcer disease: Secondary | ICD-10-CM | POA: Diagnosis not present

## 2013-01-21 DIAGNOSIS — K219 Gastro-esophageal reflux disease without esophagitis: Secondary | ICD-10-CM | POA: Diagnosis not present

## 2013-01-21 DIAGNOSIS — M1990 Unspecified osteoarthritis, unspecified site: Secondary | ICD-10-CM | POA: Diagnosis not present

## 2013-01-21 DIAGNOSIS — I2510 Atherosclerotic heart disease of native coronary artery without angina pectoris: Secondary | ICD-10-CM | POA: Diagnosis not present

## 2013-01-21 DIAGNOSIS — E785 Hyperlipidemia, unspecified: Secondary | ICD-10-CM | POA: Diagnosis not present

## 2013-02-20 DIAGNOSIS — H4010X0 Unspecified open-angle glaucoma, stage unspecified: Secondary | ICD-10-CM | POA: Diagnosis not present

## 2013-02-26 DIAGNOSIS — I10 Essential (primary) hypertension: Secondary | ICD-10-CM | POA: Diagnosis not present

## 2013-02-26 DIAGNOSIS — Z1331 Encounter for screening for depression: Secondary | ICD-10-CM | POA: Diagnosis not present

## 2013-02-26 DIAGNOSIS — Z0000 Encounter for general adult medical examination without abnormal findings: Secondary | ICD-10-CM | POA: Diagnosis not present

## 2013-02-26 DIAGNOSIS — Z1339 Encounter for screening examination for other mental health and behavioral disorders: Secondary | ICD-10-CM | POA: Diagnosis not present

## 2013-03-04 DIAGNOSIS — M25579 Pain in unspecified ankle and joints of unspecified foot: Secondary | ICD-10-CM | POA: Diagnosis not present

## 2013-03-04 DIAGNOSIS — R269 Unspecified abnormalities of gait and mobility: Secondary | ICD-10-CM | POA: Diagnosis not present

## 2013-03-04 DIAGNOSIS — E1049 Type 1 diabetes mellitus with other diabetic neurological complication: Secondary | ICD-10-CM | POA: Diagnosis not present

## 2013-03-04 DIAGNOSIS — M2040 Other hammer toe(s) (acquired), unspecified foot: Secondary | ICD-10-CM | POA: Diagnosis not present

## 2013-03-06 DIAGNOSIS — Z79899 Other long term (current) drug therapy: Secondary | ICD-10-CM | POA: Diagnosis not present

## 2013-03-06 DIAGNOSIS — E119 Type 2 diabetes mellitus without complications: Secondary | ICD-10-CM | POA: Diagnosis not present

## 2013-03-06 DIAGNOSIS — I10 Essential (primary) hypertension: Secondary | ICD-10-CM | POA: Diagnosis not present

## 2013-03-06 DIAGNOSIS — D649 Anemia, unspecified: Secondary | ICD-10-CM | POA: Diagnosis not present

## 2013-03-06 DIAGNOSIS — E039 Hypothyroidism, unspecified: Secondary | ICD-10-CM | POA: Diagnosis not present

## 2013-03-06 DIAGNOSIS — E559 Vitamin D deficiency, unspecified: Secondary | ICD-10-CM | POA: Diagnosis not present

## 2013-03-06 DIAGNOSIS — E782 Mixed hyperlipidemia: Secondary | ICD-10-CM | POA: Diagnosis not present

## 2013-03-12 DIAGNOSIS — H04129 Dry eye syndrome of unspecified lacrimal gland: Secondary | ICD-10-CM | POA: Diagnosis not present

## 2013-03-12 DIAGNOSIS — H401290 Low-tension glaucoma, unspecified eye, stage unspecified: Secondary | ICD-10-CM | POA: Diagnosis not present

## 2013-04-03 HISTORY — PX: HERNIA REPAIR: SHX51

## 2013-04-03 HISTORY — PX: VENTRAL HERNIA REPAIR: SHX424

## 2013-04-09 DIAGNOSIS — E782 Mixed hyperlipidemia: Secondary | ICD-10-CM | POA: Diagnosis not present

## 2013-04-09 DIAGNOSIS — I10 Essential (primary) hypertension: Secondary | ICD-10-CM | POA: Diagnosis not present

## 2013-04-09 DIAGNOSIS — I2510 Atherosclerotic heart disease of native coronary artery without angina pectoris: Secondary | ICD-10-CM | POA: Diagnosis not present

## 2013-04-09 DIAGNOSIS — E119 Type 2 diabetes mellitus without complications: Secondary | ICD-10-CM | POA: Diagnosis not present

## 2013-05-06 DIAGNOSIS — E1049 Type 1 diabetes mellitus with other diabetic neurological complication: Secondary | ICD-10-CM | POA: Diagnosis not present

## 2013-05-06 DIAGNOSIS — M25579 Pain in unspecified ankle and joints of unspecified foot: Secondary | ICD-10-CM | POA: Diagnosis not present

## 2013-05-06 DIAGNOSIS — R269 Unspecified abnormalities of gait and mobility: Secondary | ICD-10-CM | POA: Diagnosis not present

## 2013-05-06 DIAGNOSIS — M2040 Other hammer toe(s) (acquired), unspecified foot: Secondary | ICD-10-CM | POA: Diagnosis not present

## 2013-05-12 DIAGNOSIS — H401290 Low-tension glaucoma, unspecified eye, stage unspecified: Secondary | ICD-10-CM | POA: Diagnosis not present

## 2013-05-12 DIAGNOSIS — E119 Type 2 diabetes mellitus without complications: Secondary | ICD-10-CM | POA: Diagnosis not present

## 2013-05-12 DIAGNOSIS — H04129 Dry eye syndrome of unspecified lacrimal gland: Secondary | ICD-10-CM | POA: Diagnosis not present

## 2013-05-13 DIAGNOSIS — R259 Unspecified abnormal involuntary movements: Secondary | ICD-10-CM | POA: Diagnosis not present

## 2013-05-13 DIAGNOSIS — E1149 Type 2 diabetes mellitus with other diabetic neurological complication: Secondary | ICD-10-CM | POA: Diagnosis not present

## 2013-05-13 DIAGNOSIS — G909 Disorder of the autonomic nervous system, unspecified: Secondary | ICD-10-CM | POA: Diagnosis not present

## 2013-05-13 DIAGNOSIS — R569 Unspecified convulsions: Secondary | ICD-10-CM | POA: Diagnosis not present

## 2013-05-15 DIAGNOSIS — I2510 Atherosclerotic heart disease of native coronary artery without angina pectoris: Secondary | ICD-10-CM | POA: Diagnosis not present

## 2013-06-02 DIAGNOSIS — R561 Post traumatic seizures: Secondary | ICD-10-CM | POA: Diagnosis not present

## 2013-06-03 DIAGNOSIS — E039 Hypothyroidism, unspecified: Secondary | ICD-10-CM | POA: Diagnosis not present

## 2013-06-03 DIAGNOSIS — I10 Essential (primary) hypertension: Secondary | ICD-10-CM | POA: Diagnosis not present

## 2013-06-03 DIAGNOSIS — G40909 Epilepsy, unspecified, not intractable, without status epilepticus: Secondary | ICD-10-CM | POA: Diagnosis not present

## 2013-06-03 DIAGNOSIS — K922 Gastrointestinal hemorrhage, unspecified: Secondary | ICD-10-CM | POA: Diagnosis not present

## 2013-06-03 DIAGNOSIS — Z79899 Other long term (current) drug therapy: Secondary | ICD-10-CM | POA: Diagnosis not present

## 2013-06-03 DIAGNOSIS — E119 Type 2 diabetes mellitus without complications: Secondary | ICD-10-CM | POA: Diagnosis not present

## 2013-06-03 DIAGNOSIS — Z8711 Personal history of peptic ulcer disease: Secondary | ICD-10-CM | POA: Diagnosis not present

## 2013-06-03 DIAGNOSIS — R634 Abnormal weight loss: Secondary | ICD-10-CM | POA: Diagnosis not present

## 2013-06-03 DIAGNOSIS — N189 Chronic kidney disease, unspecified: Secondary | ICD-10-CM | POA: Diagnosis not present

## 2013-06-03 DIAGNOSIS — D500 Iron deficiency anemia secondary to blood loss (chronic): Secondary | ICD-10-CM | POA: Diagnosis not present

## 2013-06-03 DIAGNOSIS — K219 Gastro-esophageal reflux disease without esophagitis: Secondary | ICD-10-CM | POA: Diagnosis not present

## 2013-06-03 DIAGNOSIS — Z9089 Acquired absence of other organs: Secondary | ICD-10-CM | POA: Diagnosis not present

## 2013-06-03 DIAGNOSIS — M1990 Unspecified osteoarthritis, unspecified site: Secondary | ICD-10-CM | POA: Diagnosis not present

## 2013-06-03 DIAGNOSIS — I2510 Atherosclerotic heart disease of native coronary artery without angina pectoris: Secondary | ICD-10-CM | POA: Diagnosis not present

## 2013-06-03 DIAGNOSIS — E785 Hyperlipidemia, unspecified: Secondary | ICD-10-CM | POA: Diagnosis not present

## 2013-06-03 DIAGNOSIS — I129 Hypertensive chronic kidney disease with stage 1 through stage 4 chronic kidney disease, or unspecified chronic kidney disease: Secondary | ICD-10-CM | POA: Diagnosis not present

## 2013-06-12 DIAGNOSIS — R569 Unspecified convulsions: Secondary | ICD-10-CM | POA: Diagnosis not present

## 2013-06-12 DIAGNOSIS — G909 Disorder of the autonomic nervous system, unspecified: Secondary | ICD-10-CM | POA: Diagnosis not present

## 2013-06-12 DIAGNOSIS — R259 Unspecified abnormal involuntary movements: Secondary | ICD-10-CM | POA: Diagnosis not present

## 2013-06-12 DIAGNOSIS — E1149 Type 2 diabetes mellitus with other diabetic neurological complication: Secondary | ICD-10-CM | POA: Diagnosis not present

## 2013-06-19 DIAGNOSIS — N250 Renal osteodystrophy: Secondary | ICD-10-CM | POA: Diagnosis not present

## 2013-06-19 DIAGNOSIS — N183 Chronic kidney disease, stage 3 unspecified: Secondary | ICD-10-CM | POA: Diagnosis not present

## 2013-06-19 DIAGNOSIS — E119 Type 2 diabetes mellitus without complications: Secondary | ICD-10-CM | POA: Diagnosis not present

## 2013-06-19 DIAGNOSIS — I120 Hypertensive chronic kidney disease with stage 5 chronic kidney disease or end stage renal disease: Secondary | ICD-10-CM | POA: Diagnosis not present

## 2013-06-26 DIAGNOSIS — H401290 Low-tension glaucoma, unspecified eye, stage unspecified: Secondary | ICD-10-CM | POA: Diagnosis not present

## 2013-07-03 DIAGNOSIS — M2040 Other hammer toe(s) (acquired), unspecified foot: Secondary | ICD-10-CM | POA: Diagnosis not present

## 2013-07-03 DIAGNOSIS — E1049 Type 1 diabetes mellitus with other diabetic neurological complication: Secondary | ICD-10-CM | POA: Diagnosis not present

## 2013-07-03 DIAGNOSIS — M25579 Pain in unspecified ankle and joints of unspecified foot: Secondary | ICD-10-CM | POA: Diagnosis not present

## 2013-07-03 DIAGNOSIS — R269 Unspecified abnormalities of gait and mobility: Secondary | ICD-10-CM | POA: Diagnosis not present

## 2013-07-09 DIAGNOSIS — E782 Mixed hyperlipidemia: Secondary | ICD-10-CM | POA: Diagnosis not present

## 2013-07-09 DIAGNOSIS — I10 Essential (primary) hypertension: Secondary | ICD-10-CM | POA: Diagnosis not present

## 2013-07-09 DIAGNOSIS — E119 Type 2 diabetes mellitus without complications: Secondary | ICD-10-CM | POA: Diagnosis not present

## 2013-07-09 DIAGNOSIS — I2510 Atherosclerotic heart disease of native coronary artery without angina pectoris: Secondary | ICD-10-CM | POA: Diagnosis not present

## 2013-07-31 DIAGNOSIS — N644 Mastodynia: Secondary | ICD-10-CM | POA: Diagnosis not present

## 2013-07-31 DIAGNOSIS — N952 Postmenopausal atrophic vaginitis: Secondary | ICD-10-CM | POA: Diagnosis not present

## 2013-08-05 DIAGNOSIS — H04129 Dry eye syndrome of unspecified lacrimal gland: Secondary | ICD-10-CM | POA: Diagnosis not present

## 2013-08-05 DIAGNOSIS — H401290 Low-tension glaucoma, unspecified eye, stage unspecified: Secondary | ICD-10-CM | POA: Diagnosis not present

## 2013-08-07 DIAGNOSIS — N644 Mastodynia: Secondary | ICD-10-CM | POA: Diagnosis not present

## 2013-08-15 DIAGNOSIS — Z1212 Encounter for screening for malignant neoplasm of rectum: Secondary | ICD-10-CM | POA: Diagnosis not present

## 2013-08-15 DIAGNOSIS — M899 Disorder of bone, unspecified: Secondary | ICD-10-CM | POA: Diagnosis not present

## 2013-08-15 DIAGNOSIS — N951 Menopausal and female climacteric states: Secondary | ICD-10-CM | POA: Diagnosis not present

## 2013-08-15 DIAGNOSIS — Z124 Encounter for screening for malignant neoplasm of cervix: Secondary | ICD-10-CM | POA: Diagnosis not present

## 2013-08-15 DIAGNOSIS — M949 Disorder of cartilage, unspecified: Secondary | ICD-10-CM | POA: Diagnosis not present

## 2013-08-25 LAB — HM PAP SMEAR

## 2013-09-04 DIAGNOSIS — M25579 Pain in unspecified ankle and joints of unspecified foot: Secondary | ICD-10-CM | POA: Diagnosis not present

## 2013-09-04 DIAGNOSIS — M19079 Primary osteoarthritis, unspecified ankle and foot: Secondary | ICD-10-CM | POA: Diagnosis not present

## 2013-09-04 DIAGNOSIS — E1049 Type 1 diabetes mellitus with other diabetic neurological complication: Secondary | ICD-10-CM | POA: Diagnosis not present

## 2013-09-04 DIAGNOSIS — R269 Unspecified abnormalities of gait and mobility: Secondary | ICD-10-CM | POA: Diagnosis not present

## 2013-09-05 DIAGNOSIS — J189 Pneumonia, unspecified organism: Secondary | ICD-10-CM | POA: Diagnosis not present

## 2013-09-05 DIAGNOSIS — K56609 Unspecified intestinal obstruction, unspecified as to partial versus complete obstruction: Secondary | ICD-10-CM | POA: Diagnosis not present

## 2013-09-05 DIAGNOSIS — R141 Gas pain: Secondary | ICD-10-CM | POA: Diagnosis not present

## 2013-09-05 DIAGNOSIS — R109 Unspecified abdominal pain: Secondary | ICD-10-CM | POA: Diagnosis not present

## 2013-09-05 DIAGNOSIS — R110 Nausea: Secondary | ICD-10-CM | POA: Diagnosis not present

## 2013-09-06 DIAGNOSIS — D649 Anemia, unspecified: Secondary | ICD-10-CM | POA: Diagnosis present

## 2013-09-06 DIAGNOSIS — K439 Ventral hernia without obstruction or gangrene: Secondary | ICD-10-CM | POA: Diagnosis not present

## 2013-09-06 DIAGNOSIS — I999 Unspecified disorder of circulatory system: Secondary | ICD-10-CM | POA: Diagnosis not present

## 2013-09-06 DIAGNOSIS — J9600 Acute respiratory failure, unspecified whether with hypoxia or hypercapnia: Secondary | ICD-10-CM | POA: Diagnosis not present

## 2013-09-06 DIAGNOSIS — I2510 Atherosclerotic heart disease of native coronary artery without angina pectoris: Secondary | ICD-10-CM | POA: Diagnosis present

## 2013-09-06 DIAGNOSIS — K650 Generalized (acute) peritonitis: Secondary | ICD-10-CM | POA: Diagnosis not present

## 2013-09-06 DIAGNOSIS — E039 Hypothyroidism, unspecified: Secondary | ICD-10-CM | POA: Diagnosis present

## 2013-09-06 DIAGNOSIS — E860 Dehydration: Secondary | ICD-10-CM | POA: Diagnosis present

## 2013-09-06 DIAGNOSIS — N183 Chronic kidney disease, stage 3 unspecified: Secondary | ICD-10-CM | POA: Diagnosis present

## 2013-09-06 DIAGNOSIS — R109 Unspecified abdominal pain: Secondary | ICD-10-CM | POA: Diagnosis not present

## 2013-09-06 DIAGNOSIS — J189 Pneumonia, unspecified organism: Secondary | ICD-10-CM | POA: Diagnosis not present

## 2013-09-06 DIAGNOSIS — R0602 Shortness of breath: Secondary | ICD-10-CM | POA: Diagnosis not present

## 2013-09-06 DIAGNOSIS — F05 Delirium due to known physiological condition: Secondary | ICD-10-CM | POA: Diagnosis not present

## 2013-09-06 DIAGNOSIS — E119 Type 2 diabetes mellitus without complications: Secondary | ICD-10-CM | POA: Diagnosis present

## 2013-09-06 DIAGNOSIS — G40909 Epilepsy, unspecified, not intractable, without status epilepticus: Secondary | ICD-10-CM | POA: Diagnosis present

## 2013-09-06 DIAGNOSIS — K285 Chronic or unspecified gastrojejunal ulcer with perforation: Secondary | ICD-10-CM | POA: Diagnosis not present

## 2013-09-06 DIAGNOSIS — K659 Peritonitis, unspecified: Secondary | ICD-10-CM | POA: Diagnosis not present

## 2013-09-06 DIAGNOSIS — K5669 Other intestinal obstruction: Secondary | ICD-10-CM | POA: Diagnosis not present

## 2013-09-06 DIAGNOSIS — E875 Hyperkalemia: Secondary | ICD-10-CM | POA: Diagnosis present

## 2013-09-06 DIAGNOSIS — Z4682 Encounter for fitting and adjustment of non-vascular catheter: Secondary | ICD-10-CM | POA: Diagnosis not present

## 2013-09-06 DIAGNOSIS — K283 Acute gastrojejunal ulcer without hemorrhage or perforation: Secondary | ICD-10-CM | POA: Diagnosis not present

## 2013-09-06 DIAGNOSIS — K432 Incisional hernia without obstruction or gangrene: Secondary | ICD-10-CM | POA: Diagnosis not present

## 2013-09-06 DIAGNOSIS — Z452 Encounter for adjustment and management of vascular access device: Secondary | ICD-10-CM | POA: Diagnosis not present

## 2013-09-06 DIAGNOSIS — Z8711 Personal history of peptic ulcer disease: Secondary | ICD-10-CM | POA: Diagnosis not present

## 2013-09-06 DIAGNOSIS — K56609 Unspecified intestinal obstruction, unspecified as to partial versus complete obstruction: Secondary | ICD-10-CM | POA: Diagnosis not present

## 2013-09-06 DIAGNOSIS — Z8673 Personal history of transient ischemic attack (TIA), and cerebral infarction without residual deficits: Secondary | ICD-10-CM | POA: Diagnosis not present

## 2013-09-06 DIAGNOSIS — J159 Unspecified bacterial pneumonia: Secondary | ICD-10-CM | POA: Diagnosis present

## 2013-09-06 DIAGNOSIS — I129 Hypertensive chronic kidney disease with stage 1 through stage 4 chronic kidney disease, or unspecified chronic kidney disease: Secondary | ICD-10-CM | POA: Diagnosis present

## 2013-09-06 DIAGNOSIS — Z01810 Encounter for preprocedural cardiovascular examination: Secondary | ICD-10-CM | POA: Diagnosis not present

## 2013-09-06 DIAGNOSIS — K430 Incisional hernia with obstruction, without gangrene: Secondary | ICD-10-CM | POA: Diagnosis not present

## 2013-09-06 DIAGNOSIS — N179 Acute kidney failure, unspecified: Secondary | ICD-10-CM | POA: Diagnosis present

## 2013-09-06 DIAGNOSIS — E785 Hyperlipidemia, unspecified: Secondary | ICD-10-CM | POA: Diagnosis present

## 2013-09-07 DIAGNOSIS — J189 Pneumonia, unspecified organism: Secondary | ICD-10-CM | POA: Diagnosis present

## 2013-09-07 DIAGNOSIS — I129 Hypertensive chronic kidney disease with stage 1 through stage 4 chronic kidney disease, or unspecified chronic kidney disease: Secondary | ICD-10-CM | POA: Diagnosis present

## 2013-09-07 DIAGNOSIS — R0902 Hypoxemia: Secondary | ICD-10-CM | POA: Diagnosis not present

## 2013-09-07 DIAGNOSIS — I2510 Atherosclerotic heart disease of native coronary artery without angina pectoris: Secondary | ICD-10-CM | POA: Diagnosis present

## 2013-09-07 DIAGNOSIS — I10 Essential (primary) hypertension: Secondary | ICD-10-CM | POA: Insufficient documentation

## 2013-09-07 DIAGNOSIS — J90 Pleural effusion, not elsewhere classified: Secondary | ICD-10-CM | POA: Diagnosis not present

## 2013-09-07 DIAGNOSIS — Z9889 Other specified postprocedural states: Secondary | ICD-10-CM | POA: Diagnosis not present

## 2013-09-07 DIAGNOSIS — IMO0002 Reserved for concepts with insufficient information to code with codable children: Secondary | ICD-10-CM | POA: Diagnosis not present

## 2013-09-07 DIAGNOSIS — R509 Fever, unspecified: Secondary | ICD-10-CM | POA: Diagnosis not present

## 2013-09-07 DIAGNOSIS — J9600 Acute respiratory failure, unspecified whether with hypoxia or hypercapnia: Secondary | ICD-10-CM | POA: Diagnosis not present

## 2013-09-07 DIAGNOSIS — K659 Peritonitis, unspecified: Secondary | ICD-10-CM | POA: Diagnosis present

## 2013-09-07 DIAGNOSIS — F05 Delirium due to known physiological condition: Secondary | ICD-10-CM | POA: Diagnosis present

## 2013-09-07 DIAGNOSIS — I69998 Other sequelae following unspecified cerebrovascular disease: Secondary | ICD-10-CM | POA: Diagnosis not present

## 2013-09-07 DIAGNOSIS — R918 Other nonspecific abnormal finding of lung field: Secondary | ICD-10-CM | POA: Diagnosis not present

## 2013-09-07 DIAGNOSIS — M129 Arthropathy, unspecified: Secondary | ICD-10-CM | POA: Diagnosis present

## 2013-09-07 DIAGNOSIS — I4891 Unspecified atrial fibrillation: Secondary | ICD-10-CM | POA: Diagnosis not present

## 2013-09-07 DIAGNOSIS — R569 Unspecified convulsions: Secondary | ICD-10-CM | POA: Diagnosis not present

## 2013-09-07 DIAGNOSIS — G589 Mononeuropathy, unspecified: Secondary | ICD-10-CM | POA: Diagnosis not present

## 2013-09-07 DIAGNOSIS — F3289 Other specified depressive episodes: Secondary | ICD-10-CM | POA: Diagnosis not present

## 2013-09-07 DIAGNOSIS — K219 Gastro-esophageal reflux disease without esophagitis: Secondary | ICD-10-CM | POA: Diagnosis present

## 2013-09-07 DIAGNOSIS — D638 Anemia in other chronic diseases classified elsewhere: Secondary | ICD-10-CM | POA: Diagnosis not present

## 2013-09-07 DIAGNOSIS — R9431 Abnormal electrocardiogram [ECG] [EKG]: Secondary | ICD-10-CM | POA: Diagnosis not present

## 2013-09-07 DIAGNOSIS — J984 Other disorders of lung: Secondary | ICD-10-CM | POA: Diagnosis not present

## 2013-09-07 DIAGNOSIS — E639 Nutritional deficiency, unspecified: Secondary | ICD-10-CM | POA: Diagnosis not present

## 2013-09-07 DIAGNOSIS — E1149 Type 2 diabetes mellitus with other diabetic neurological complication: Secondary | ICD-10-CM | POA: Diagnosis present

## 2013-09-07 DIAGNOSIS — E119 Type 2 diabetes mellitus without complications: Secondary | ICD-10-CM | POA: Insufficient documentation

## 2013-09-07 DIAGNOSIS — K283 Acute gastrojejunal ulcer without hemorrhage or perforation: Secondary | ICD-10-CM | POA: Diagnosis not present

## 2013-09-07 DIAGNOSIS — Z4682 Encounter for fitting and adjustment of non-vascular catheter: Secondary | ICD-10-CM | POA: Diagnosis not present

## 2013-09-07 DIAGNOSIS — F329 Major depressive disorder, single episode, unspecified: Secondary | ICD-10-CM | POA: Diagnosis not present

## 2013-09-07 DIAGNOSIS — G4700 Insomnia, unspecified: Secondary | ICD-10-CM | POA: Diagnosis not present

## 2013-09-07 DIAGNOSIS — J9819 Other pulmonary collapse: Secondary | ICD-10-CM | POA: Diagnosis not present

## 2013-09-07 DIAGNOSIS — Z09 Encounter for follow-up examination after completed treatment for conditions other than malignant neoplasm: Secondary | ICD-10-CM | POA: Diagnosis not present

## 2013-09-07 DIAGNOSIS — N183 Chronic kidney disease, stage 3 unspecified: Secondary | ICD-10-CM | POA: Diagnosis present

## 2013-09-07 DIAGNOSIS — I959 Hypotension, unspecified: Secondary | ICD-10-CM | POA: Diagnosis not present

## 2013-09-07 DIAGNOSIS — R768 Other specified abnormal immunological findings in serum: Secondary | ICD-10-CM | POA: Insufficient documentation

## 2013-09-07 DIAGNOSIS — R718 Other abnormality of red blood cells: Secondary | ICD-10-CM | POA: Diagnosis present

## 2013-09-07 DIAGNOSIS — R5381 Other malaise: Secondary | ICD-10-CM | POA: Diagnosis not present

## 2013-09-07 DIAGNOSIS — E785 Hyperlipidemia, unspecified: Secondary | ICD-10-CM | POA: Diagnosis not present

## 2013-09-07 DIAGNOSIS — Z48815 Encounter for surgical aftercare following surgery on the digestive system: Secondary | ICD-10-CM | POA: Diagnosis not present

## 2013-09-07 DIAGNOSIS — Z9049 Acquired absence of other specified parts of digestive tract: Secondary | ICD-10-CM | POA: Diagnosis not present

## 2013-09-07 DIAGNOSIS — K432 Incisional hernia without obstruction or gangrene: Secondary | ICD-10-CM | POA: Diagnosis present

## 2013-09-07 DIAGNOSIS — R269 Unspecified abnormalities of gait and mobility: Secondary | ICD-10-CM | POA: Diagnosis not present

## 2013-09-07 DIAGNOSIS — R899 Unspecified abnormal finding in specimens from other organs, systems and tissues: Secondary | ICD-10-CM | POA: Diagnosis present

## 2013-09-07 DIAGNOSIS — K831 Obstruction of bile duct: Secondary | ICD-10-CM | POA: Diagnosis not present

## 2013-09-07 DIAGNOSIS — H04129 Dry eye syndrome of unspecified lacrimal gland: Secondary | ICD-10-CM | POA: Diagnosis not present

## 2013-09-07 DIAGNOSIS — I871 Compression of vein: Secondary | ICD-10-CM | POA: Diagnosis not present

## 2013-09-07 DIAGNOSIS — Z452 Encounter for adjustment and management of vascular access device: Secondary | ICD-10-CM | POA: Diagnosis not present

## 2013-09-07 DIAGNOSIS — R109 Unspecified abdominal pain: Secondary | ICD-10-CM | POA: Diagnosis not present

## 2013-09-07 DIAGNOSIS — E1142 Type 2 diabetes mellitus with diabetic polyneuropathy: Secondary | ICD-10-CM | POA: Diagnosis present

## 2013-09-07 DIAGNOSIS — K285 Chronic or unspecified gastrojejunal ulcer with perforation: Secondary | ICD-10-CM | POA: Insufficient documentation

## 2013-09-07 DIAGNOSIS — E039 Hypothyroidism, unspecified: Secondary | ICD-10-CM | POA: Diagnosis present

## 2013-09-07 DIAGNOSIS — R5383 Other fatigue: Secondary | ICD-10-CM | POA: Diagnosis present

## 2013-09-07 DIAGNOSIS — R279 Unspecified lack of coordination: Secondary | ICD-10-CM | POA: Diagnosis not present

## 2013-09-07 DIAGNOSIS — D6862 Lupus anticoagulant syndrome: Secondary | ICD-10-CM | POA: Insufficient documentation

## 2013-09-07 DIAGNOSIS — R404 Transient alteration of awareness: Secondary | ICD-10-CM | POA: Diagnosis not present

## 2013-09-07 DIAGNOSIS — M6281 Muscle weakness (generalized): Secondary | ICD-10-CM | POA: Diagnosis not present

## 2013-09-07 DIAGNOSIS — R062 Wheezing: Secondary | ICD-10-CM | POA: Diagnosis not present

## 2013-09-07 DIAGNOSIS — G40909 Epilepsy, unspecified, not intractable, without status epilepticus: Secondary | ICD-10-CM | POA: Diagnosis present

## 2013-09-18 LAB — BASIC METABOLIC PANEL
BUN: 32 mg/dL — AB (ref 4–21)
Creatinine: 1 mg/dL (ref 0.5–1.1)
Glucose: 139 mg/dL
Potassium: 4.4 mmol/L (ref 3.4–5.3)
Sodium: 138 mmol/L (ref 137–147)

## 2013-09-18 LAB — CBC AND DIFFERENTIAL
HCT: 28 % — AB (ref 36–46)
Hemoglobin: 8.9 g/dL — AB (ref 12.0–16.0)
WBC: 11.7 10^3/mL

## 2013-09-19 DIAGNOSIS — Z48815 Encounter for surgical aftercare following surgery on the digestive system: Secondary | ICD-10-CM | POA: Diagnosis not present

## 2013-09-19 DIAGNOSIS — R609 Edema, unspecified: Secondary | ICD-10-CM | POA: Diagnosis not present

## 2013-09-19 DIAGNOSIS — K831 Obstruction of bile duct: Secondary | ICD-10-CM | POA: Diagnosis not present

## 2013-09-19 DIAGNOSIS — G4700 Insomnia, unspecified: Secondary | ICD-10-CM | POA: Diagnosis not present

## 2013-09-19 DIAGNOSIS — R5381 Other malaise: Secondary | ICD-10-CM | POA: Diagnosis not present

## 2013-09-19 DIAGNOSIS — F3289 Other specified depressive episodes: Secondary | ICD-10-CM | POA: Diagnosis not present

## 2013-09-19 DIAGNOSIS — G589 Mononeuropathy, unspecified: Secondary | ICD-10-CM | POA: Diagnosis not present

## 2013-09-19 DIAGNOSIS — R279 Unspecified lack of coordination: Secondary | ICD-10-CM | POA: Diagnosis not present

## 2013-09-19 DIAGNOSIS — E785 Hyperlipidemia, unspecified: Secondary | ICD-10-CM | POA: Diagnosis not present

## 2013-09-19 DIAGNOSIS — E1159 Type 2 diabetes mellitus with other circulatory complications: Secondary | ICD-10-CM | POA: Diagnosis not present

## 2013-09-19 DIAGNOSIS — I4891 Unspecified atrial fibrillation: Secondary | ICD-10-CM | POA: Diagnosis not present

## 2013-09-19 DIAGNOSIS — K219 Gastro-esophageal reflux disease without esophagitis: Secondary | ICD-10-CM | POA: Diagnosis not present

## 2013-09-19 DIAGNOSIS — R269 Unspecified abnormalities of gait and mobility: Secondary | ICD-10-CM | POA: Diagnosis not present

## 2013-09-19 DIAGNOSIS — H04129 Dry eye syndrome of unspecified lacrimal gland: Secondary | ICD-10-CM | POA: Diagnosis not present

## 2013-09-19 DIAGNOSIS — I10 Essential (primary) hypertension: Secondary | ICD-10-CM | POA: Diagnosis not present

## 2013-09-19 DIAGNOSIS — R5383 Other fatigue: Secondary | ICD-10-CM | POA: Diagnosis not present

## 2013-09-19 DIAGNOSIS — M6281 Muscle weakness (generalized): Secondary | ICD-10-CM | POA: Diagnosis not present

## 2013-09-19 DIAGNOSIS — K275 Chronic or unspecified peptic ulcer, site unspecified, with perforation: Secondary | ICD-10-CM | POA: Diagnosis not present

## 2013-09-19 DIAGNOSIS — E119 Type 2 diabetes mellitus without complications: Secondary | ICD-10-CM | POA: Diagnosis not present

## 2013-09-19 DIAGNOSIS — R569 Unspecified convulsions: Secondary | ICD-10-CM | POA: Diagnosis not present

## 2013-09-19 DIAGNOSIS — F329 Major depressive disorder, single episode, unspecified: Secondary | ICD-10-CM | POA: Diagnosis not present

## 2013-09-19 DIAGNOSIS — E039 Hypothyroidism, unspecified: Secondary | ICD-10-CM | POA: Diagnosis not present

## 2013-09-19 LAB — CBC AND DIFFERENTIAL
HCT: 28 % — AB (ref 36–46)
Hemoglobin: 9 g/dL — AB (ref 12.0–16.0)
Platelets: 381 10*3/uL (ref 150–399)
WBC: 5.2 10^3/mL

## 2013-09-22 ENCOUNTER — Encounter: Payer: Self-pay | Admitting: Adult Health

## 2013-09-22 ENCOUNTER — Non-Acute Institutional Stay (SKILLED_NURSING_FACILITY): Payer: Medicare Other | Admitting: Adult Health

## 2013-09-22 DIAGNOSIS — E0843 Diabetes mellitus due to underlying condition with diabetic autonomic (poly)neuropathy: Secondary | ICD-10-CM

## 2013-09-22 DIAGNOSIS — E785 Hyperlipidemia, unspecified: Secondary | ICD-10-CM | POA: Diagnosis not present

## 2013-09-22 DIAGNOSIS — E1140 Type 2 diabetes mellitus with diabetic neuropathy, unspecified: Secondary | ICD-10-CM | POA: Insufficient documentation

## 2013-09-22 DIAGNOSIS — E1159 Type 2 diabetes mellitus with other circulatory complications: Secondary | ICD-10-CM

## 2013-09-22 DIAGNOSIS — E119 Type 2 diabetes mellitus without complications: Secondary | ICD-10-CM

## 2013-09-22 DIAGNOSIS — E1349 Other specified diabetes mellitus with other diabetic neurological complication: Secondary | ICD-10-CM

## 2013-09-22 DIAGNOSIS — K219 Gastro-esophageal reflux disease without esophagitis: Secondary | ICD-10-CM | POA: Diagnosis not present

## 2013-09-22 DIAGNOSIS — R569 Unspecified convulsions: Secondary | ICD-10-CM

## 2013-09-22 DIAGNOSIS — E1122 Type 2 diabetes mellitus with diabetic chronic kidney disease: Secondary | ICD-10-CM | POA: Insufficient documentation

## 2013-09-22 DIAGNOSIS — E1169 Type 2 diabetes mellitus with other specified complication: Secondary | ICD-10-CM | POA: Insufficient documentation

## 2013-09-22 DIAGNOSIS — I4819 Other persistent atrial fibrillation: Secondary | ICD-10-CM

## 2013-09-22 DIAGNOSIS — I639 Cerebral infarction, unspecified: Secondary | ICD-10-CM

## 2013-09-22 DIAGNOSIS — I10 Essential (primary) hypertension: Secondary | ICD-10-CM | POA: Diagnosis not present

## 2013-09-22 DIAGNOSIS — I4891 Unspecified atrial fibrillation: Secondary | ICD-10-CM

## 2013-09-22 DIAGNOSIS — Z8673 Personal history of transient ischemic attack (TIA), and cerebral infarction without residual deficits: Secondary | ICD-10-CM | POA: Insufficient documentation

## 2013-09-22 DIAGNOSIS — G909 Disorder of the autonomic nervous system, unspecified: Secondary | ICD-10-CM

## 2013-09-22 DIAGNOSIS — E039 Hypothyroidism, unspecified: Secondary | ICD-10-CM | POA: Insufficient documentation

## 2013-09-22 DIAGNOSIS — I6350 Cerebral infarction due to unspecified occlusion or stenosis of unspecified cerebral artery: Secondary | ICD-10-CM

## 2013-09-22 NOTE — Progress Notes (Signed)
Patient ID: Shannon [name], female   DOB: 04/07/1923, 80 y.o.   MRN: [id number]     [facility]  No Known Allergies   Chief Complaint  Patient presents with  . Hospitalization Follow-up    HPI:  She has been hospitalized for a ventral hernia repair. she has an abdominal wound which has wound vac and will need to heal by secondary intention. She is here for wound management and short term therapy; her goal at this time is to return back home.    Past Medical History  Diagnosis Date  . Pneumonia   . Thyroid disease   . Seizures   . Stroke   . Hyperlipidemia   . Hypertension   . Diabetes mellitus without complication   . Small bowel obstruction   . Ventral hernia with bowel obstruction     Past Surgical History  Procedure Laterality Date  . Exploratory laparotomy  09-07-2013  . Cholecystectomy    . Tubal ligation    . Colectomy      VITAL SIGNS BP 130/70  Pulse 73  Ht 5\' 6"  (1.676 m)  Wt 151 lb 6.4 oz (68.675 kg)  BMI 24.45 kg/m2  SpO2 96%   Patient's Medications  New Prescriptions   No medications on file  Previous Medications   AMLODIPINE (NORVASC) 10 MG TABLET    Take 10 mg by mouth daily.   ATORVASTATIN (LIPITOR) 10 MG TABLET    Take 10 mg by mouth daily at 6 PM.   CYCLOSPORINE (RESTASIS) 0.05 % OPHTHALMIC EMULSION    Place 1 drop into both eyes 2 (two) times daily.   DOCUSATE SODIUM (COLACE) 100 MG CAPSULE    Take 100 mg by mouth 2 (two) times daily.   FLUCONAZOLE (DIFLUCAN) 2 MG/ML SOLN IVPB    Inject 400 mg into the vein daily.   FLUTICASONE (FLONASE) 50 MCG/ACT NASAL SPRAY    Place 2 sprays into both nostrils daily.   GABAPENTIN (NEURONTIN) 300 MG CAPSULE    Take 300 mg by mouth 3 (three) times daily.   HYDRALAZINE (APRESOLINE) 25 MG TABLET    Take 25 mg by mouth 3 (three) times daily.   ISOSORBIDE MONONITRATE (IMDUR) 30 MG 24 HR TABLET    Take 30 mg by mouth daily.   L-METHYLFOLATE-B6-B12 (METANX) 3-35-2 MG TABS    Take 1 tablet by mouth daily.   LEVETIRACETAM (KEPPRA) 250 MG TABLET    Take 250 mg by mouth 2 (two) times daily.   LEVOTHYROXINE (SYNTHROID, LEVOTHROID) 175 MCG TABLET    Take 175 mcg by mouth daily before breakfast.   LOSARTAN (COZAAR) 100 MG TABLET    Take 100 mg by mouth daily.   MELATONIN 3 MG TABS    Take 3 mg by mouth at bedtime.   METOPROLOL SUCCINATE (TOPROL-XL) 50 MG 24 HR TABLET    Take 50 mg by mouth daily. Take with or immediately following a meal.   OXYCODONE (ROXICODONE) 5 MG/5ML SOLUTION    Take 5 mg by mouth every 6 (six) hours as needed for severe pain.   PANTOPRAZOLE (PROTONIX) 40 MG TABLET    Take 40 mg by mouth daily.   SERTRALINE (ZOLOFT) 100 MG TABLET    Take 100 mg by mouth daily.   SUCRALFATE (CARAFATE) 1 G TABLET    Take 1 g by mouth 3 (three) times daily with meals.  Modified Medications   No medications on file  Discontinued Medications   No medications on file  SIGNIFICANT DIAGNOSTIC EXAMS    LABS REVIEWED:   09-17-13:wbc 10.2; hgb 8.8; hct 26.9; mcv 98; plt 319  glucose 179; bun 34; creat 0.95; k+4.1; na++141 mag 1.8; phos 3.8  09-18-13:wbc 11.7; hgb 8.9; hct 28.2; mcv 99; plt 351  glucose 139; bun 32; creat 1.04; k+4.4; na++138 mag 1.8; phos 3.8;     Review of Systems  Constitutional: Negative for malaise/fatigue.  Respiratory: Negative for cough and shortness of breath.   Cardiovascular: Negative for chest pain, palpitations and leg swelling.  Gastrointestinal: Negative for heartburn, abdominal pain, diarrhea and constipation.  Musculoskeletal: Negative for joint pain and myalgias.  Skin:       Has abdominal wound   Psychiatric/Behavioral: Negative for depression. The patient is not nervous/anxious.      Physical Exam  Constitutional: She is oriented to person, place, and time. She appears well-developed and well-nourished. No distress.  Neck: Neck supple. No JVD present. No thyromegaly present.  Cardiovascular: Normal rate, regular rhythm and intact distal pulses.     Respiratory: Effort normal and breath sounds normal.  GI: Soft. Bowel sounds are normal. She exhibits no distension. There is no tenderness.  Musculoskeletal: She exhibits no edema.  Is able to move all extremities   Neurological: She is alert and oriented to person, place, and time.  Skin: Skin is warm and dry. She is not diaphoretic.  Abdominal surgical incision:  17.4 x 5.5 x 2.7 cm has 100% granulating tissue present will be treated with a wound vac no signs of infection present   Psychiatric: She has a normal mood and affect.     ASSESSMENT/ PLAN:  1.abdominal surgical wound: will continue wound vac as directed; will have her complete her diflucan as ordered and will continue her oxycodone 5 mg every 6 hours as needed for pain and will monitor   2. Hypertension: is stable will continue norvasc 10 mg daily; toprol xl 50 mg daily and cozaar 100 mg daily; imdur 30 mg daily  and will monitor  3. Seizure: no reports of seizure activity present; will continue keppra 250 mg twice daily; and neurontin 300 mg three times daily   4. Hypothyroidism: will continue synthroid 175 mcg daily   5. Dyslipidemia: will continue lipitor 10 mg daily  6. GERD: will continue protonix 40 mg daily and carafate 1 gm three times daily  7. Allergic rhinitis: will continue flonase daily  8. Depression: will continue zoloft daily; is stable at this time  9. Constipation: will continue colace twice daily   Time spent with patient 50 minutes   Awaiting more complete medical records from [facility] NP [facility]  Contact [phone] Monday through Friday 8am- 5pm  After hours call [phone]

## 2013-09-29 ENCOUNTER — Non-Acute Institutional Stay (SKILLED_NURSING_FACILITY): Payer: Medicare Other | Admitting: Internal Medicine

## 2013-09-29 ENCOUNTER — Encounter: Payer: Self-pay | Admitting: Internal Medicine

## 2013-09-29 DIAGNOSIS — R5383 Other fatigue: Secondary | ICD-10-CM

## 2013-09-29 DIAGNOSIS — I4891 Unspecified atrial fibrillation: Secondary | ICD-10-CM

## 2013-09-29 DIAGNOSIS — E039 Hypothyroidism, unspecified: Secondary | ICD-10-CM

## 2013-09-29 DIAGNOSIS — R5381 Other malaise: Secondary | ICD-10-CM | POA: Diagnosis not present

## 2013-09-29 DIAGNOSIS — R531 Weakness: Secondary | ICD-10-CM

## 2013-09-29 DIAGNOSIS — J189 Pneumonia, unspecified organism: Secondary | ICD-10-CM

## 2013-09-29 DIAGNOSIS — R569 Unspecified convulsions: Secondary | ICD-10-CM

## 2013-09-29 DIAGNOSIS — F32A Depression, unspecified: Secondary | ICD-10-CM | POA: Insufficient documentation

## 2013-09-29 DIAGNOSIS — K275 Chronic or unspecified peptic ulcer, site unspecified, with perforation: Secondary | ICD-10-CM | POA: Insufficient documentation

## 2013-09-29 DIAGNOSIS — F329 Major depressive disorder, single episode, unspecified: Secondary | ICD-10-CM

## 2013-09-29 DIAGNOSIS — I480 Paroxysmal atrial fibrillation: Secondary | ICD-10-CM

## 2013-09-29 DIAGNOSIS — F3289 Other specified depressive episodes: Secondary | ICD-10-CM

## 2013-09-29 DIAGNOSIS — D62 Acute posthemorrhagic anemia: Secondary | ICD-10-CM | POA: Insufficient documentation

## 2013-09-29 NOTE — Progress Notes (Signed)
Patient ID: Shannon [name], female   DOB: 28-Aug-1923, 80 y.o.   MRN: [id number]     Facility: [facility]    PCP: No primary provider on file.  Code Status: full code  No Known Allergies  Chief Complaint: new admission  HPI:  80 y/o female patient is here for STR after hospital admission from 09/07/13- 09/19/13 with abdominal pain from small bowel obstruction and irreducible ventral hernia. She underwent ex-lap, resection of a GJ anastomosis with primary JJ anastomosis, repair of recurrent incisional hernia and EGD for perforated marginal ulcer at her GJ anastomosis. She had afib with RVR and respiratory distress requiring intubation. She was also treated for CAP. Her peritoneal fluid culture grew candida and she required fluconazole- has completed course of fluconazole and rocephin. She has history of HTN, DM, CVA, diabetic neuropathy among others. She is seen in her room today. She feels tired easily. She has worked with therapy team this am. Her pain is under control at present. She has been having some cough. Her appetite is fair. Strength has improved some as per patient  Review of Systems:  Constitutional: Negative for fever, chills, diaphoresis.  HENT: Negative for congestion, hearing loss and sore throat.   Eyes: Negative for eye pain, blurred vision, double vision and discharge.  Respiratory: positive for cough and shortness of breath with exertion. Negative for wheezing.   Cardiovascular: Negative for chest pain, palpitations, orthopnea and leg swelling.  Gastrointestinal: Negative for heartburn, nausea, vomiting, diarrhea and constipation.  Genitourinary: Negative for dysuria Musculoskeletal: Negative for back pain, falls Skin: Negative for itching and rash.  Neurological: Negative for dizziness, tingling, focal weakness and headaches.  Psychiatric/Behavioral: Negative for depression and memory loss. The patient is not nervous/anxious.     Past Medical  History  Diagnosis Date  . Pneumonia   . Thyroid disease   . Seizures   . Stroke   . Hyperlipidemia   . Hypertension   . Diabetes mellitus without complication   . Small bowel obstruction   . Ventral hernia with bowel obstruction    Past Surgical History  Procedure Laterality Date  . Exploratory laparotomy  09-07-2013  . Cholecystectomy    . Tubal ligation    . Colectomy     Social History:   reports that she has never smoked. She does not have any smokeless tobacco history on file. Her alcohol and drug histories are not on file.  History reviewed. No pertinent family history.  Medications: Patient's Medications  New Prescriptions   No medications on file  Previous Medications   AMLODIPINE (NORVASC) 10 MG TABLET    Take 10 mg by mouth daily.   ATORVASTATIN (LIPITOR) 10 MG TABLET    Take 10 mg by mouth daily at 6 PM.   CYCLOSPORINE (RESTASIS) 0.05 % OPHTHALMIC EMULSION    Place 1 drop into both eyes 2 (two) times daily.   DOCUSATE SODIUM (COLACE) 100 MG CAPSULE    Take 100 mg by mouth 2 (two) times daily.   FLUTICASONE (FLONASE) 50 MCG/ACT NASAL SPRAY    Place 2 sprays into both nostrils daily.   GABAPENTIN (NEURONTIN) 300 MG CAPSULE    Take 300 mg by mouth 3 (three) times daily.   HYDRALAZINE (APRESOLINE) 25 MG TABLET    Take 25 mg by mouth 3 (three) times daily.   ISOSORBIDE MONONITRATE (IMDUR) 30 MG 24 HR TABLET    Take 30 mg by mouth daily.   L-METHYLFOLATE-B6-B12 (METANX) 3-35-2 MG TABS  Take 1 tablet by mouth daily.   LEVETIRACETAM (KEPPRA) 250 MG TABLET    Take 250 mg by mouth 2 (two) times daily.   LEVOTHYROXINE (SYNTHROID, LEVOTHROID) 175 MCG TABLET    Take 175 mcg by mouth daily before breakfast.   LOSARTAN (COZAAR) 100 MG TABLET    Take 100 mg by mouth daily.   MELATONIN 3 MG TABS    Take 3 mg by mouth at bedtime.   METOPROLOL SUCCINATE (TOPROL-XL) 50 MG 24 HR TABLET    Take 50 mg by mouth daily. Take with or immediately following a meal.   OXYCODONE  (ROXICODONE) 5 MG/5ML SOLUTION    Take 5 mg by mouth every 6 (six) hours as needed for severe pain.   PANTOPRAZOLE (PROTONIX) 40 MG TABLET    Take 40 mg by mouth daily.   SERTRALINE (ZOLOFT) 100 MG TABLET    Take 100 mg by mouth daily.   SUCRALFATE (CARAFATE) 1 G TABLET    Take 1 g by mouth 3 (three) times daily with meals.  Modified Medications   No medications on file  Discontinued Medications   No medications on file     Physical Exam: Filed Vitals:   09/29/13 1231  BP: 128/76  Pulse: 80  Temp: 98 F (36.7 C)  Resp: 18  SpO2: 95%    General- elderly female in no acute distress Head- atraumatic, normocephalic Eyes- PERRLA, EOMI, no pallor, no icterus, no discharge Neck- no lymphadenopathy, no thyromegaly Cardiovascular- normal s1,s2, no murmurs/ rubs/ gallops Respiratory- bilateral decreased air entry, no wheeze or rhonchi or crackles, no use of accessory muscles but appears to be stopping in between sentences Abdomen- bowel sounds present, soft, incision site has margins well approximated with dressing in place and wound vac in place Musculoskeletal- able to move all 4 extremities, no leg edema, generalized weakness Neurological- no focal deficit Skin- warm and dry Psychiatry- alert and oriented to person, place, normal mood and affect   Labs reviewed: Basic Metabolic Panel:  Recent Labs  87/56/43  NA 138  K 4.4  BUN 32*  CREATININE 1.0   CBC:  Recent Labs  09/18/13  WBC 11.7  HGB 8.9*  HCT 28*   09/11/13 hb 11.2/ hct 34.4, na 129, k 4.4 Bun 13, cr 1.10  Assessment/Plan  Generalized weakness In setting of her recent infection, surgical complication and deconditioning. Will have her work with physical therapy and occupational therapy team to help with gait training and muscle strengthening exercises.fall precautions. Skin care. Encourage to be out of bed. Monitor oral intake. Check prealbumin level.   Anemia Likely post surgical with blood loss. Recheck  cbc  Perforated ulcer S/p repair with wound vac in place. Off antibiotics. Monitor clinically. Continue wound care. Continue oxycodone 5 mg every 6 hours as needed for pain and monitor for bowel movement. On colace. Also to continue protonix and sucralfate for her ulcer  Hypertension Well controlled bp readings. Continue amlodipine 10 mg daily, toprol xl 50 mg daily and cozaar 100 mg daily with imdur 30 mg daily. Was discharged from hospital on hydralazine with no specific dosing and frequency. Monitor bp readings and if has multiple high readings, consider re-introducing hydralazine at low dose  Hypothyroidism continue synthroid 175 mcg daily   Seizure Remains seizure free. continue keppra 250 mg twice daily and neurontin 300 mg three times daily   Pneumonia Has completed course of rocephin. Has some cough and dyspnea on exertion. Will have her use incentive spirometer and  duoneb prn. The deconditioning is most likely contributing to her dyspnea. Monitor clinically. If her cough worsens or breathing worsens, reassess for need of chest xray  Depression continue zoloft daily and melatonin to help with her sleep  afib Rate controlled. Continue b blocker for now  Family/ staff Communication: reviewed care plan with patient and nursing supervisor   Goals of care: short term rehabilitation   Labs/tests ordered: cbc with diff, cmp, prealbumin    Oneal Grout, MD  [facility] [phone] (Monday-Friday 8 am - 5 pm) [phone] (afterhours)

## 2013-09-30 LAB — HEPATIC FUNCTION PANEL
ALT: 22 U/L (ref 7–35)
AST: 20 U/L (ref 13–35)
Alkaline Phosphatase: 74 U/L (ref 25–125)
Bilirubin, Total: 0.3 mg/dL

## 2013-09-30 LAB — ALBUMIN: Albumin: 2.9

## 2013-09-30 LAB — PREALBUMIN: Prealbumin: 23.57 mg/dL

## 2013-09-30 LAB — BASIC METABOLIC PANEL
BUN: 19 mg/dL (ref 4–21)
Creatinine: 1 mg/dL (ref 0.5–1.1)
Glucose: 124 mg/dL
Potassium: 4.3 mmol/L (ref 3.4–5.3)
Sodium: 127 mmol/L — AB (ref 137–147)

## 2013-09-30 LAB — CBC AND DIFFERENTIAL
HCT: 27 % — AB (ref 36–46)
Hemoglobin: 8.9 g/dL — AB (ref 12.0–16.0)
Platelets: 397 10*3/uL (ref 150–399)
WBC: 10.3 10^3/mL

## 2013-09-30 LAB — MAGNESIUM: Magnesium: 1.5 mg/dL — AB (ref 1.6–2.4)

## 2013-10-01 ENCOUNTER — Non-Acute Institutional Stay (SKILLED_NURSING_FACILITY): Payer: Medicare Other | Admitting: Adult Health

## 2013-10-01 DIAGNOSIS — R609 Edema, unspecified: Secondary | ICD-10-CM | POA: Diagnosis not present

## 2013-10-06 DIAGNOSIS — R609 Edema, unspecified: Secondary | ICD-10-CM | POA: Insufficient documentation

## 2013-10-06 MED ORDER — MAGNESIUM OXIDE 400 MG PO TABS: 400.0000 mg | ORAL_TABLET | Freq: Two times a day (BID) | ORAL | Status: DC

## 2013-10-06 NOTE — Progress Notes (Signed)
Patient ID: Shannon [name], female   DOB: 09-26-1923, 80 y.o.   MRN: [id number]     [facility]  No Known Allergies   Chief Complaint  Patient presents with  . Acute Visit    follow up labs     HPI:  Her magnesium level is low at 1.5. She is also having increased lower extremity edema bilaterally. She states the edema is uncomfortable. She also tells me that she is trying to keep her legs elevated as much as possible.    Past Medical History  Diagnosis Date  . Pneumonia   . Thyroid disease   . Seizures   . Stroke   . Hyperlipidemia   . Hypertension   . Diabetes mellitus without complication   . Small bowel obstruction   . Ventral hernia with bowel obstruction     Past Surgical History  Procedure Laterality Date  . Exploratory laparotomy  09-07-2013  . Cholecystectomy    . Tubal ligation    . Colectomy      VITAL SIGNS BP 129/79  Pulse 76  Ht 5\' 6"  (1.676 m)  Wt 151 lb 6 oz (68.663 kg)  BMI 24.44 kg/m2   Patient's Medications  New Prescriptions   No medications on file  Previous Medications   AMLODIPINE (NORVASC) 10 MG TABLET    Take 10 mg by mouth daily.   ATORVASTATIN (LIPITOR) 10 MG TABLET    Take 10 mg by mouth daily at 6 PM.   CYCLOSPORINE (RESTASIS) 0.05 % OPHTHALMIC EMULSION    Place 1 drop into both eyes 2 (two) times daily.   DOCUSATE SODIUM (COLACE) 100 MG CAPSULE    Take 100 mg by mouth 2 (two) times daily.   FLUTICASONE (FLONASE) 50 MCG/ACT NASAL SPRAY    Place 2 sprays into both nostrils daily.   GABAPENTIN (NEURONTIN) 300 MG CAPSULE    Take 300 mg by mouth 3 (three) times daily.   HYDRALAZINE (APRESOLINE) 25 MG TABLET    Take 25 mg by mouth 3 (three) times daily.   ISOSORBIDE MONONITRATE (IMDUR) 30 MG 24 HR TABLET    Take 30 mg by mouth daily.   L-METHYLFOLATE-B6-B12 (METANX) 3-35-2 MG TABS    Take 1 tablet by mouth daily.   LEVETIRACETAM (KEPPRA) 250 MG TABLET    Take 250 mg by mouth 2 (two) times daily.   LEVOTHYROXINE (SYNTHROID, LEVOTHROID)  175 MCG TABLET    Take 175 mcg by mouth daily before breakfast.   LOSARTAN (COZAAR) 100 MG TABLET    Take 100 mg by mouth daily.   MELATONIN 3 MG TABS    Take 3 mg by mouth at bedtime.   METOPROLOL SUCCINATE (TOPROL-XL) 50 MG 24 HR TABLET    Take 50 mg by mouth daily. Take with or immediately following a meal.   OXYCODONE (ROXICODONE) 5 MG/5ML SOLUTION    Take 5 mg by mouth every 6 (six) hours as needed for severe pain.   PANTOPRAZOLE (PROTONIX) 40 MG TABLET    Take 40 mg by mouth daily.   SERTRALINE (ZOLOFT) 100 MG TABLET    Take 100 mg by mouth daily.   SUCRALFATE (CARAFATE) 1 G TABLET    Take 1 g by mouth 3 (three) times daily with meals.  Modified Medications   No medications on file  Discontinued Medications   No medications on file    SIGNIFICANT DIAGNOSTIC EXAMS  09-07-13: kub: enteric tub tip and sidehole overlie the gastric lumen   09-10-13: chest  x-ray: NG tube is now in the distal esophagus. Increased haziness in the right lung has progressed since 09-09-13. Question aspiration or asymmetric edema?  09-10-13: bilateral upper extremity doppler: negative for dvt  09-11-13: bilateral lower extremity doppler: negative for dvt  09-11-13: chest x-ray; 1. NG tube has been advanced with side port no longer visualized. Remainder support apparatus unchanged. 2. Extensive bilateral heterogeneous opacities, slightly more confluent within the right upper lung, are without definite change given differences in technique. 3. No pleural effusion or pneumothorax   09-11-13: upper GI: no evidence of leak or stricture. Limited study.      LABS REVIEWED:   09-17-13:wbc 10.2; hgb 8.8; hct 26.9; mcv 98; plt 319  glucose 179; bun 34; creat 0.95; k+4.1; na++141 mag 1.8; phos 3.8  09-18-13:wbc 11.7; hgb 8.9; hct 28.2; mcv 99; plt 351  glucose 139; bun 32; creat 1.04; k+4.4; na++138 mag 1.8; phos 3.8;  09-19-13: wbc 10.3; hgb 8.9; hct 27.4; mcv 96; plt 397 09-30-13: wbc 5.2; hgb 9.0; hct 27.6; mcv 95.8; plt  381; glucose 124; bun 19; creat 1.0; k+4.3; na++127; liver normal albumin 2.9; pre-albumin 23.57 mag 1.5     Review of Systems  Constitutional: Negative for malaise/fatigue.  Respiratory: Negative for cough and shortness of breath.   Cardiovascular: Negative for chest pain, palpitations and leg swelling.  Gastrointestinal: Negative for heartburn, abdominal pain, diarrhea and constipation.  Musculoskeletal: Negative for joint pain and myalgias.  Skin:       Has abdominal wound   Psychiatric/Behavioral: Negative for depression. The patient is not nervous/anxious.      Physical Exam  Constitutional: She is oriented to person, place, and time. She appears well-developed and well-nourished. No distress.  Neck: Neck supple. No JVD present. No thyromegaly present.  Cardiovascular: Normal rate, regular rhythm and intact distal pulses.   Respiratory: Effort normal and breath sounds normal.  GI: Soft. Bowel sounds are normal. She exhibits no distension. There is no tenderness.  Musculoskeletal: She exhibits 1+ lower extremity edema  Is able to move all extremities   Neurological: She is alert and oriented to person, place, and time.  Skin: Skin is warm and dry. She is not diaphoretic.  Abdominal surgical incision:  17.4 x 5.5 x 2.7 cm has 100% granulating tissue present will be treated with a wound vac no signs of infection present   Psychiatric: She has a normal mood and affect.      ASSESSMENT/ PLAN:  1. Hypomagnesemia: will begin mag ox 400 mg twice daily and will check in one week; will monitor  2. Edema: will begin knee high ted hose daily and will monitor           Synthia Innocent NP [facility]  Contact [phone] Monday through Friday 8am- 5pm  After hours call [phone]

## 2013-10-15 ENCOUNTER — Non-Acute Institutional Stay (SKILLED_NURSING_FACILITY): Payer: Medicare Other | Admitting: Adult Health

## 2013-10-15 DIAGNOSIS — E1159 Type 2 diabetes mellitus with other circulatory complications: Secondary | ICD-10-CM | POA: Diagnosis not present

## 2013-10-15 DIAGNOSIS — K275 Chronic or unspecified peptic ulcer, site unspecified, with perforation: Secondary | ICD-10-CM

## 2013-10-15 DIAGNOSIS — I10 Essential (primary) hypertension: Secondary | ICD-10-CM | POA: Diagnosis not present

## 2013-10-15 DIAGNOSIS — R569 Unspecified convulsions: Secondary | ICD-10-CM | POA: Diagnosis not present

## 2013-10-16 ENCOUNTER — Encounter: Payer: Self-pay | Admitting: Adult Health

## 2013-10-16 DIAGNOSIS — K259 Gastric ulcer, unspecified as acute or chronic, without hemorrhage or perforation: Secondary | ICD-10-CM | POA: Diagnosis not present

## 2013-10-16 DIAGNOSIS — E119 Type 2 diabetes mellitus without complications: Secondary | ICD-10-CM | POA: Diagnosis not present

## 2013-10-16 DIAGNOSIS — F3289 Other specified depressive episodes: Secondary | ICD-10-CM | POA: Diagnosis not present

## 2013-10-16 DIAGNOSIS — R32 Unspecified urinary incontinence: Secondary | ICD-10-CM | POA: Diagnosis not present

## 2013-10-16 DIAGNOSIS — I10 Essential (primary) hypertension: Secondary | ICD-10-CM | POA: Diagnosis not present

## 2013-10-16 DIAGNOSIS — Z8701 Personal history of pneumonia (recurrent): Secondary | ICD-10-CM | POA: Diagnosis not present

## 2013-10-16 DIAGNOSIS — R569 Unspecified convulsions: Secondary | ICD-10-CM | POA: Diagnosis not present

## 2013-10-16 DIAGNOSIS — Z9181 History of falling: Secondary | ICD-10-CM | POA: Diagnosis not present

## 2013-10-16 DIAGNOSIS — Z48814 Encounter for surgical aftercare following surgery on the teeth or oral cavity: Secondary | ICD-10-CM

## 2013-10-16 DIAGNOSIS — Z48815 Encounter for surgical aftercare following surgery on the digestive system: Secondary | ICD-10-CM | POA: Diagnosis not present

## 2013-10-16 DIAGNOSIS — F329 Major depressive disorder, single episode, unspecified: Secondary | ICD-10-CM | POA: Diagnosis not present

## 2013-10-16 DIAGNOSIS — Z4801 Encounter for change or removal of surgical wound dressing: Secondary | ICD-10-CM | POA: Diagnosis not present

## 2013-10-16 NOTE — Progress Notes (Signed)
Patient ID: Shannon [name], female   DOB: Aug 16, 1923, 80 y.o.   MRN: [id number]     Shannon [name]  No Known Allergies   Chief Complaint  Patient presents with  . Discharge Note    HPI:  She is being discharged to home with home health for pt/ot/rn-wound management/homehealth aid. She will not need any dme as she has all needed equipment. She will need prescriptions to be written for her. She has a follow up appointment scheduled with her pcp.  She had been hospitalized for a ventral hernia repair. She was admitted to this facility for short term rehab and with therapy and wound management. She originally had a wound vac placed which has been removed and is now her wound is being managed with hydrogel daily.    Past Medical History  Diagnosis Date  . Pneumonia   . Thyroid disease   . Seizures   . Stroke   . Hyperlipidemia   . Hypertension   . Diabetes mellitus without complication   . Small bowel obstruction   . Ventral hernia with bowel obstruction     Past Surgical History  Procedure Laterality Date  . Exploratory laparotomy  09-07-2013  . Cholecystectomy    . Tubal ligation    . Colectomy      VITAL SIGNS BP 145/68  Pulse 65  Ht 5\' 6"  (1.676 m)  Wt 152 lb 4.8 oz (69.083 kg)  BMI 24.59 kg/m2   Patient's Medications  New Prescriptions   No medications on file  Previous Medications   AMLODIPINE (NORVASC) 10 MG TABLET    Take 10 mg by mouth daily.   ATORVASTATIN (LIPITOR) 10 MG TABLET    Take 10 mg by mouth daily at 6 PM.   CYCLOSPORINE (RESTASIS) 0.05 % OPHTHALMIC EMULSION    [name] 1 drop into both eyes 2 (two) times daily.   DOCUSATE SODIUM (COLACE) 100 MG CAPSULE    Take 100 mg by mouth 2 (two) times daily.   FLUTICASONE (FLONASE) 50 MCG/ACT NASAL SPRAY    [name] 2 sprays into both nostrils daily.   GABAPENTIN (NEURONTIN) 300 MG CAPSULE    Take 300 mg by mouth 3 (three) times daily.   HYDRALAZINE (APRESOLINE) 25 MG TABLET    Take 25 mg by mouth 3 (three) times  daily.   ISOSORBIDE MONONITRATE (IMDUR) 30 MG 24 HR TABLET    Take 30 mg by mouth daily.   L-METHYLFOLATE-B6-B12 (METANX) 3-35-2 MG TABS    Take 1 tablet by mouth daily.   LEVETIRACETAM (KEPPRA) 250 MG TABLET    Take 250 mg by mouth 2 (two) times daily.   LEVOTHYROXINE (SYNTHROID, LEVOTHROID) 175 MCG TABLET    Take 175 mcg by mouth daily before breakfast.   LOSARTAN (COZAAR) 100 MG TABLET    Take 100 mg by mouth daily.   MAGNESIUM OXIDE (MAG-OX) 400 MG TABLET    Take 1 tablet (400 mg total) by mouth 2 (two) times daily.   MELATONIN 3 MG TABS    Take 3 mg by mouth at bedtime.   METOPROLOL SUCCINATE (TOPROL-XL) 50 MG 24 HR TABLET    Take 50 mg by mouth daily. Take with or immediately following a meal.   OXYCODONE (ROXICODONE) 5 MG/5ML SOLUTION    Take 5 mg by mouth every 6 (six) hours as needed for severe pain.   PANTOPRAZOLE (PROTONIX) 40 MG TABLET    Take 40 mg by mouth daily.   SENNOSIDES-DOCUSATE SODIUM (SENOKOT-S) 8.6-50 MG TABLET  Take 2 tablets by mouth 2 (two) times daily.   SERTRALINE (ZOLOFT) 100 MG TABLET    Take 100 mg by mouth daily.   SUCRALFATE (CARAFATE) 1 G TABLET    Take 1 g by mouth 3 (three) times daily with meals.  Modified Medications   No medications on file  Discontinued Medications   No medications on file    SIGNIFICANT DIAGNOSTIC EXAMS  09-07-13: kub: enteric tub tip and sidehole overlie the gastric lumen   09-10-13: chest x-ray: NG tube is now in the distal esophagus. Increased haziness in the right lung has progressed since 09-09-13. Question aspiration or asymmetric edema?  09-10-13: bilateral upper extremity doppler: negative for dvt  09-11-13: bilateral lower extremity doppler: negative for dvt  09-11-13: chest x-ray; 1. NG tube has been advanced with side port no longer visualized. Remainder support apparatus unchanged. 2. Extensive bilateral heterogeneous opacities, slightly more confluent within the right upper lung, are without definite change given  differences in technique. 3. No pleural effusion or pneumothorax   09-11-13: upper GI: no evidence of leak or stricture. Limited study.      LABS REVIEWED:   09-17-13:wbc 10.2; hgb 8.8; hct 26.9; mcv 98; plt 319  glucose 179; bun 34; creat 0.95; k+4.1; na++141 mag 1.8; phos 3.8  09-18-13:wbc 11.7; hgb 8.9; hct 28.2; mcv 99; plt 351  glucose 139; bun 32; creat 1.04; k+4.4; na++138 mag 1.8; phos 3.8;  09-19-13: wbc 10.3; hgb 8.9; hct 27.4; mcv 96; plt 397 09-30-13: wbc 5.2; hgb 9.0; hct 27.6; mcv 95.8; plt 381; glucose 124; bun 19; creat 1.0; k+4.3; na++127; liver normal albumin 2.9; pre-albumin 23.57 mag 1.5     Review of Systems  Constitutional: Negative for malaise/fatigue.  Respiratory: Negative for cough and shortness of breath.   Cardiovascular: Negative for chest pain, palpitations and leg swelling.  Gastrointestinal: Negative for heartburn, abdominal pain, diarrhea and constipation.  Musculoskeletal: Negative for joint pain and myalgias.  Skin:       Has abdominal wound   Psychiatric/Behavioral: Negative for depression. The patient is not nervous/anxious.      Physical Exam  Constitutional: She is oriented to person, [name], and time. She appears well-developed and well-nourished. No distress.  Neck: Neck supple. No JVD present. No thyromegaly present.  Cardiovascular: Normal rate, regular rhythm and intact distal pulses.   Respiratory: Effort normal and breath sounds normal.  GI: Soft. Bowel sounds are normal. She exhibits no distension. There is no tenderness.  Musculoskeletal: She exhibits 1+ lower extremity edema  Is able to move all extremities   Neurological: She is alert and oriented to person, [name], and time.  Skin: Skin is warm and dry. She is not diaphoretic.  Abdominal surgical incision: is off wound vac without signs of infection present; being treated with hydrogel daily.  Has nearly resolved.  Psychiatric: She has a normal mood and affect.       ASSESSMENT/  PLAN:  Will discharge her to home with home health for pt/ot/rn-wound management/homehealth aid. She will not need any dme. Her prescriptions have been written for her. She has a follow up appointment with her pcp: Dr. Sol [name] on 10-31-13 at 11 am.        Synthia Innocent NP [facility]  Contact [phone] Monday through Friday 8am- 5pm  After hours call [phone]

## 2013-10-20 DIAGNOSIS — F329 Major depressive disorder, single episode, unspecified: Secondary | ICD-10-CM | POA: Diagnosis not present

## 2013-10-20 DIAGNOSIS — F3289 Other specified depressive episodes: Secondary | ICD-10-CM | POA: Diagnosis not present

## 2013-10-20 DIAGNOSIS — Z48815 Encounter for surgical aftercare following surgery on the digestive system: Secondary | ICD-10-CM | POA: Diagnosis not present

## 2013-10-20 DIAGNOSIS — R569 Unspecified convulsions: Secondary | ICD-10-CM | POA: Diagnosis not present

## 2013-10-20 DIAGNOSIS — E119 Type 2 diabetes mellitus without complications: Secondary | ICD-10-CM | POA: Diagnosis not present

## 2013-10-20 DIAGNOSIS — K259 Gastric ulcer, unspecified as acute or chronic, without hemorrhage or perforation: Secondary | ICD-10-CM | POA: Diagnosis not present

## 2013-10-20 DIAGNOSIS — I10 Essential (primary) hypertension: Secondary | ICD-10-CM | POA: Diagnosis not present

## 2013-10-23 DIAGNOSIS — R569 Unspecified convulsions: Secondary | ICD-10-CM | POA: Diagnosis not present

## 2013-10-23 DIAGNOSIS — F3289 Other specified depressive episodes: Secondary | ICD-10-CM | POA: Diagnosis not present

## 2013-10-23 DIAGNOSIS — I10 Essential (primary) hypertension: Secondary | ICD-10-CM | POA: Diagnosis not present

## 2013-10-23 DIAGNOSIS — E119 Type 2 diabetes mellitus without complications: Secondary | ICD-10-CM | POA: Diagnosis not present

## 2013-10-23 DIAGNOSIS — Z48815 Encounter for surgical aftercare following surgery on the digestive system: Secondary | ICD-10-CM | POA: Diagnosis not present

## 2013-10-23 DIAGNOSIS — F329 Major depressive disorder, single episode, unspecified: Secondary | ICD-10-CM | POA: Diagnosis not present

## 2013-10-23 DIAGNOSIS — K259 Gastric ulcer, unspecified as acute or chronic, without hemorrhage or perforation: Secondary | ICD-10-CM | POA: Diagnosis not present

## 2013-10-27 DIAGNOSIS — R569 Unspecified convulsions: Secondary | ICD-10-CM | POA: Diagnosis not present

## 2013-10-27 DIAGNOSIS — F329 Major depressive disorder, single episode, unspecified: Secondary | ICD-10-CM | POA: Diagnosis not present

## 2013-10-27 DIAGNOSIS — F3289 Other specified depressive episodes: Secondary | ICD-10-CM | POA: Diagnosis not present

## 2013-10-27 DIAGNOSIS — K259 Gastric ulcer, unspecified as acute or chronic, without hemorrhage or perforation: Secondary | ICD-10-CM | POA: Diagnosis not present

## 2013-10-27 DIAGNOSIS — I10 Essential (primary) hypertension: Secondary | ICD-10-CM | POA: Diagnosis not present

## 2013-10-27 DIAGNOSIS — Z48815 Encounter for surgical aftercare following surgery on the digestive system: Secondary | ICD-10-CM | POA: Diagnosis not present

## 2013-10-27 DIAGNOSIS — E119 Type 2 diabetes mellitus without complications: Secondary | ICD-10-CM | POA: Diagnosis not present

## 2013-10-28 DIAGNOSIS — F3289 Other specified depressive episodes: Secondary | ICD-10-CM | POA: Diagnosis not present

## 2013-10-28 DIAGNOSIS — I10 Essential (primary) hypertension: Secondary | ICD-10-CM | POA: Diagnosis not present

## 2013-10-28 DIAGNOSIS — K259 Gastric ulcer, unspecified as acute or chronic, without hemorrhage or perforation: Secondary | ICD-10-CM | POA: Diagnosis not present

## 2013-10-28 DIAGNOSIS — R569 Unspecified convulsions: Secondary | ICD-10-CM | POA: Diagnosis not present

## 2013-10-28 DIAGNOSIS — F329 Major depressive disorder, single episode, unspecified: Secondary | ICD-10-CM | POA: Diagnosis not present

## 2013-10-28 DIAGNOSIS — E119 Type 2 diabetes mellitus without complications: Secondary | ICD-10-CM | POA: Diagnosis not present

## 2013-10-28 DIAGNOSIS — Z48815 Encounter for surgical aftercare following surgery on the digestive system: Secondary | ICD-10-CM | POA: Diagnosis not present

## 2013-10-31 DIAGNOSIS — I10 Essential (primary) hypertension: Secondary | ICD-10-CM | POA: Diagnosis not present

## 2013-10-31 DIAGNOSIS — R569 Unspecified convulsions: Secondary | ICD-10-CM | POA: Diagnosis not present

## 2013-10-31 DIAGNOSIS — F3289 Other specified depressive episodes: Secondary | ICD-10-CM | POA: Diagnosis not present

## 2013-10-31 DIAGNOSIS — E119 Type 2 diabetes mellitus without complications: Secondary | ICD-10-CM | POA: Diagnosis not present

## 2013-10-31 DIAGNOSIS — F329 Major depressive disorder, single episode, unspecified: Secondary | ICD-10-CM | POA: Diagnosis not present

## 2013-10-31 DIAGNOSIS — Z48815 Encounter for surgical aftercare following surgery on the digestive system: Secondary | ICD-10-CM | POA: Diagnosis not present

## 2013-10-31 DIAGNOSIS — K259 Gastric ulcer, unspecified as acute or chronic, without hemorrhage or perforation: Secondary | ICD-10-CM | POA: Diagnosis not present

## 2013-11-03 DIAGNOSIS — E119 Type 2 diabetes mellitus without complications: Secondary | ICD-10-CM | POA: Diagnosis not present

## 2013-11-03 DIAGNOSIS — E782 Mixed hyperlipidemia: Secondary | ICD-10-CM | POA: Diagnosis not present

## 2013-11-03 DIAGNOSIS — I2510 Atherosclerotic heart disease of native coronary artery without angina pectoris: Secondary | ICD-10-CM | POA: Diagnosis not present

## 2013-11-03 DIAGNOSIS — I10 Essential (primary) hypertension: Secondary | ICD-10-CM | POA: Diagnosis not present

## 2013-11-05 DIAGNOSIS — R569 Unspecified convulsions: Secondary | ICD-10-CM | POA: Diagnosis not present

## 2013-11-05 DIAGNOSIS — F329 Major depressive disorder, single episode, unspecified: Secondary | ICD-10-CM | POA: Diagnosis not present

## 2013-11-05 DIAGNOSIS — K259 Gastric ulcer, unspecified as acute or chronic, without hemorrhage or perforation: Secondary | ICD-10-CM | POA: Diagnosis not present

## 2013-11-05 DIAGNOSIS — E119 Type 2 diabetes mellitus without complications: Secondary | ICD-10-CM | POA: Diagnosis not present

## 2013-11-05 DIAGNOSIS — Z48815 Encounter for surgical aftercare following surgery on the digestive system: Secondary | ICD-10-CM | POA: Diagnosis not present

## 2013-11-05 DIAGNOSIS — F3289 Other specified depressive episodes: Secondary | ICD-10-CM | POA: Diagnosis not present

## 2013-11-05 DIAGNOSIS — I10 Essential (primary) hypertension: Secondary | ICD-10-CM | POA: Diagnosis not present

## 2013-11-11 ENCOUNTER — Other Ambulatory Visit: Payer: Self-pay | Admitting: Adult Health

## 2013-11-24 DIAGNOSIS — Z79899 Other long term (current) drug therapy: Secondary | ICD-10-CM | POA: Diagnosis not present

## 2013-11-24 DIAGNOSIS — D649 Anemia, unspecified: Secondary | ICD-10-CM | POA: Diagnosis not present

## 2013-11-24 DIAGNOSIS — I2510 Atherosclerotic heart disease of native coronary artery without angina pectoris: Secondary | ICD-10-CM | POA: Diagnosis not present

## 2013-11-24 DIAGNOSIS — N182 Chronic kidney disease, stage 2 (mild): Secondary | ICD-10-CM | POA: Diagnosis not present

## 2013-11-24 DIAGNOSIS — E039 Hypothyroidism, unspecified: Secondary | ICD-10-CM | POA: Diagnosis not present

## 2013-11-24 DIAGNOSIS — E782 Mixed hyperlipidemia: Secondary | ICD-10-CM | POA: Diagnosis not present

## 2013-11-24 DIAGNOSIS — E559 Vitamin D deficiency, unspecified: Secondary | ICD-10-CM | POA: Diagnosis not present

## 2013-11-25 ENCOUNTER — Ambulatory Visit (INDEPENDENT_AMBULATORY_CARE_PROVIDER_SITE_OTHER): Payer: Medicare Other | Admitting: Internal Medicine

## 2013-11-25 ENCOUNTER — Encounter: Payer: Self-pay | Admitting: Internal Medicine

## 2013-11-25 VITALS — BP 150/72 | HR 72 | Temp 97.9°F | Ht 65.25 in | Wt 151.0 lb

## 2013-11-25 DIAGNOSIS — I4891 Unspecified atrial fibrillation: Secondary | ICD-10-CM

## 2013-11-25 DIAGNOSIS — I482 Chronic atrial fibrillation, unspecified: Secondary | ICD-10-CM

## 2013-11-25 DIAGNOSIS — F32A Depression, unspecified: Secondary | ICD-10-CM

## 2013-11-25 DIAGNOSIS — E1349 Other specified diabetes mellitus with other diabetic neurological complication: Secondary | ICD-10-CM

## 2013-11-25 DIAGNOSIS — E0843 Diabetes mellitus due to underlying condition with diabetic autonomic (poly)neuropathy: Secondary | ICD-10-CM

## 2013-11-25 DIAGNOSIS — F3289 Other specified depressive episodes: Secondary | ICD-10-CM | POA: Diagnosis not present

## 2013-11-25 DIAGNOSIS — I6350 Cerebral infarction due to unspecified occlusion or stenosis of unspecified cerebral artery: Secondary | ICD-10-CM

## 2013-11-25 DIAGNOSIS — E039 Hypothyroidism, unspecified: Secondary | ICD-10-CM

## 2013-11-25 DIAGNOSIS — F329 Major depressive disorder, single episode, unspecified: Secondary | ICD-10-CM

## 2013-11-25 DIAGNOSIS — I10 Essential (primary) hypertension: Secondary | ICD-10-CM

## 2013-11-25 DIAGNOSIS — G909 Disorder of the autonomic nervous system, unspecified: Secondary | ICD-10-CM

## 2013-11-25 DIAGNOSIS — R569 Unspecified convulsions: Secondary | ICD-10-CM

## 2013-11-25 DIAGNOSIS — E785 Hyperlipidemia, unspecified: Secondary | ICD-10-CM

## 2013-11-25 DIAGNOSIS — K219 Gastro-esophageal reflux disease without esophagitis: Secondary | ICD-10-CM

## 2013-11-25 NOTE — Progress Notes (Signed)
Patient ID: Shannon [name], female   DOB: [birth year], 80 y.o.   MRN: [id number]    Chief Complaint  Patient presents with  . Establish Care    NP- Establish Care  . other    low back pain x 1 yr, Tylenol Arthritis PRN with no relief., having a cough, runny nose/eyes  taken Claritin wth no relief.  . Immunizations     not sure when last Pneumo vaccine done.  will get records.   No Known Allergies  HPI 80 y/o female patient is here to establish care. She is currently living with her daughter in [place]  She was seeing Dr Shannon [name] in [place] last on 11/03/13. She is moving to live with her daughter in [place]. She uses a walker  Has chronic lower back pain on and off for a year and she is taking tylenol prn for it without any relief. Cannot taking NSAIDs with hx of perforated ulcer  Denies smoking  She has history of old cva, afib, HTN, DM, perforated ulcer and gerd, depression No prior records for review  Review of Systems  Constitutional: Negative for fever, chills, weight loss, malaise/fatigue and diaphoresis.  HENT: Negative for hearing loss and sore throat.   Eyes: Negative for blurred vision, double vision and discharge. has glasses Respiratory: Negative for sputum production, shortness of breath and wheezing.   Cardiovascular: Negative for chest pain, palpitations, orthopnea. Has leg swelling.  Gastrointestinal: Negative for heartburn, nausea, vomiting, abdominal pain, diarrhea and constipation.  Genitourinary: Negative for dysuria, urgency, frequency and flank pain.  Musculoskeletal: Negative for falls,myalgias. uses a walker and has knee pain Skin: Negative for itching and rash. has dry skin Neurological: Negative for  tingling, focal weakness and headaches. feels dizzy and off balance when trying to change her position quickly Psychiatric/Behavioral: Negative for depression. The patient is not nervous/anxious.    Past Medical History  Diagnosis Date  .  Pneumonia   . Thyroid disease   . Seizures   . Stroke   . Hyperlipidemia   . Hypertension   . Diabetes mellitus without complication   . Small bowel obstruction   . Ventral hernia with bowel obstruction    Past Surgical History  Procedure Laterality Date  . Exploratory laparotomy  09-07-2013  . Cholecystectomy    . Tubal ligation    . Colectomy    . Hernia repair  2015   History reviewed. No pertinent family history.  History   Social History  . Marital Status: Married    Spouse Name: N/A    Number of Children: N/A  . Years of Education: N/A   Occupational History  . Not on file.   Social History Main Topics  . Smoking status: Never Smoker   . Smokeless tobacco: Not on file  . Alcohol Use: No  . Drug Use: No  . Sexual Activity: Not on file   Other Topics Concern  . Not on file   Social History Narrative   Married in [place], lives in one story house with 2 people and no pets   Occupation: ?   Has a living Will, [place], and doesn't have DNR   Current Outpatient Prescriptions on File Prior to Visit  Medication Sig Dispense Refill  . amLODipine (NORVASC) 10 MG tablet Take 10 mg by mouth daily.      [name] atorvastatin (LIPITOR) 10 MG tablet Take 10 mg by mouth daily at 6 PM.      . cycloSPORINE (RESTASIS) 0.05 % ophthalmic  emulsion Place 1 drop into both eyes 2 (two) times daily.      [name] docusate sodium (COLACE) 100 MG capsule Take 100 mg by mouth 2 (two) times daily.      . fluticasone (FLONASE) 50 MCG/ACT nasal spray Place 2 sprays into both nostrils daily.      [name] gabapentin (NEURONTIN) 300 MG capsule Take 300 mg by mouth 3 (three) times daily.      . hydrALAZINE (APRESOLINE) 25 MG tablet Take 25 mg by mouth 3 (three) times daily.      . isosorbide mononitrate (IMDUR) 30 MG 24 hr tablet Take 30 mg by mouth daily.      [name] l-methylfolate-B6-B12 (METANX) 3-35-2 MG TABS Take 1 tablet by mouth daily.      [name] levETIRAcetam (KEPPRA) 250 MG tablet Take 250 mg by mouth 2 (two) times  daily.      [name] levothyroxine (SYNTHROID, LEVOTHROID) 175 MCG tablet Take 175 mcg by mouth daily before breakfast.      . losartan (COZAAR) 100 MG tablet Take 100 mg by mouth daily.      . magnesium oxide (MAG-OX) 400 MG tablet Take 1 tablet (400 mg total) by mouth 2 (two) times daily.  120 tablet  11  . metoprolol succinate (TOPROL-XL) 50 MG 24 hr tablet Take 50 mg by mouth daily. Take with or immediately following a meal.      . pantoprazole (PROTONIX) 40 MG tablet Take 40 mg by mouth daily.      . sertraline (ZOLOFT) 100 MG tablet Take 100 mg by mouth daily.      . sucralfate (CARAFATE) 1 G tablet Take 1 g by mouth 3 (three) times daily with meals.      . sennosides-docusate sodium (SENOKOT-S) 8.6-50 MG tablet Take 2 tablets by mouth 2 (two) times daily.       No current facility-administered medications on file prior to visit.    Physical exam BP 150/72  Pulse 72  Temp(Src) 97.9 F (36.6 C) (Oral)  Ht 5' 5.25" (1.657 m)  Wt 151 lb (68.493 kg)  BMI 24.95 kg/m2  SpO2 97%  General- elderly female in no acute distress Head- atraumatic, normocephalic Eyes- PERRLA, EOMI, no pallor, no icterus, no discharge Neck- no lymphadenopathy, no thyromegaly Mouth- normal mucus membrane, no oral thrush, normal oropharynx Cardiovascular- normal s1,s2, no murmurs, trace edema Respiratory- bilateral clear to auscultation, no wheeze, no rhonchi, no crackles Abdomen- bowel sounds present, soft, non tender Musculoskeletal- able to move all 4 extremities, uses a walker, normal ROM, no spinal tenderness Neurological- no focal deficit Skin- warm and dry Psychiatry- alert and oriented, normal mood and affect  Labs- Lab Results  Component Value Date   WBC 10.3 09/30/2013   HGB 8.9* 09/30/2013   HCT 27* 09/30/2013   PLT 397 09/30/2013   CMP     Component Value Date/Time   NA 127* 09/30/2013   K 4.3 09/30/2013   BUN 19 09/30/2013   CREATININE 1.0 09/30/2013   AST 20 09/30/2013   ALT 22 09/30/2013    ALKPHOS 74 09/30/2013   No results found for this basename: HGBA1C   Assessment/plan  1. Chronic atrial fibrillation rate controlled. Continue b blocker for now  2. Essential hypertension, benign SBP elevated, advised to check bp readings at home. Warning signs with elevated bp readings explained. Continue amlodipine, hydralazine, imdur and toprol  3. Seizures Remains seziure free. On keppra for now, would like to come off it. Will need to  review prior records first  4. Depression continue zoloft daily for now  5. Dyslipidemia On lipitor, continue this for now  6. Unspecified hypothyroidism continue synthroid 175 mcg daily   7. Diabetic autonomic neuropathy associated with diabetes mellitus due to underlying condition On gabapentin- continue this, review prior records  8. Gastroesophageal reflux disease without esophagitis She is s/p perforated ulcer and repair. Continue protonix and sucralfate  9. Chronic back pain possible from OA changes. Will need to review prior records to decide on pain management options. No back pain at present and on exam no spinal or paraspinal tenderness. Can't be on NSAIDs with hx of gi ulcer. Advised to take tylenol extra strength prn for now. If no relief, will consider tylenol with codeine  Will need to get records from dr patel's office for review. Pt mentions having recent labs there on 11/03/13. Will review that and decide on further work up

## 2013-12-01 DIAGNOSIS — E1049 Type 1 diabetes mellitus with other diabetic neurological complication: Secondary | ICD-10-CM | POA: Diagnosis not present

## 2013-12-01 DIAGNOSIS — M19079 Primary osteoarthritis, unspecified ankle and foot: Secondary | ICD-10-CM | POA: Diagnosis not present

## 2013-12-01 DIAGNOSIS — M25579 Pain in unspecified ankle and joints of unspecified foot: Secondary | ICD-10-CM | POA: Diagnosis not present

## 2013-12-01 DIAGNOSIS — R269 Unspecified abnormalities of gait and mobility: Secondary | ICD-10-CM | POA: Diagnosis not present

## 2013-12-01 DIAGNOSIS — R569 Unspecified convulsions: Secondary | ICD-10-CM | POA: Diagnosis not present

## 2013-12-15 DIAGNOSIS — Z09 Encounter for follow-up examination after completed treatment for conditions other than malignant neoplasm: Secondary | ICD-10-CM | POA: Diagnosis not present

## 2013-12-15 DIAGNOSIS — Z5189 Encounter for other specified aftercare: Secondary | ICD-10-CM | POA: Diagnosis not present

## 2013-12-15 DIAGNOSIS — I10 Essential (primary) hypertension: Secondary | ICD-10-CM | POA: Diagnosis not present

## 2013-12-16 DIAGNOSIS — H43819 Vitreous degeneration, unspecified eye: Secondary | ICD-10-CM | POA: Diagnosis not present

## 2013-12-16 DIAGNOSIS — H401290 Low-tension glaucoma, unspecified eye, stage unspecified: Secondary | ICD-10-CM | POA: Diagnosis not present

## 2013-12-16 DIAGNOSIS — H04129 Dry eye syndrome of unspecified lacrimal gland: Secondary | ICD-10-CM | POA: Diagnosis not present

## 2013-12-16 DIAGNOSIS — E119 Type 2 diabetes mellitus without complications: Secondary | ICD-10-CM | POA: Diagnosis not present

## 2013-12-23 ENCOUNTER — Emergency Department (HOSPITAL_COMMUNITY): Payer: Medicare Other

## 2013-12-23 ENCOUNTER — Inpatient Hospital Stay (HOSPITAL_COMMUNITY)
Admission: EM | Admit: 2013-12-23 | Discharge: 2013-12-26 | DRG: 312 | Disposition: A | Discharge status: Home / Self Care | Payer: Medicare Other | Attending: Internal Medicine | Admitting: Internal Medicine

## 2013-12-23 ENCOUNTER — Encounter (HOSPITAL_COMMUNITY): Payer: Self-pay | Admitting: Emergency Medicine

## 2013-12-23 DIAGNOSIS — I482 Chronic atrial fibrillation, unspecified: Secondary | ICD-10-CM

## 2013-12-23 DIAGNOSIS — E1159 Type 2 diabetes mellitus with other circulatory complications: Secondary | ICD-10-CM | POA: Diagnosis not present

## 2013-12-23 DIAGNOSIS — IMO0002 Reserved for concepts with insufficient information to code with codable children: Secondary | ICD-10-CM | POA: Diagnosis not present

## 2013-12-23 DIAGNOSIS — Z823 Family history of stroke: Secondary | ICD-10-CM | POA: Diagnosis not present

## 2013-12-23 DIAGNOSIS — F329 Major depressive disorder, single episode, unspecified: Secondary | ICD-10-CM

## 2013-12-23 DIAGNOSIS — R262 Difficulty in walking, not elsewhere classified: Secondary | ICD-10-CM | POA: Diagnosis present

## 2013-12-23 DIAGNOSIS — R4781 Slurred speech: Secondary | ICD-10-CM

## 2013-12-23 DIAGNOSIS — R4789 Other speech disturbances: Secondary | ICD-10-CM | POA: Diagnosis present

## 2013-12-23 DIAGNOSIS — I509 Heart failure, unspecified: Secondary | ICD-10-CM | POA: Diagnosis present

## 2013-12-23 DIAGNOSIS — E1169 Type 2 diabetes mellitus with other specified complication: Secondary | ICD-10-CM | POA: Diagnosis present

## 2013-12-23 DIAGNOSIS — I4891 Unspecified atrial fibrillation: Secondary | ICD-10-CM | POA: Diagnosis not present

## 2013-12-23 DIAGNOSIS — R259 Unspecified abnormal involuntary movements: Secondary | ICD-10-CM | POA: Diagnosis present

## 2013-12-23 DIAGNOSIS — K219 Gastro-esophageal reflux disease without esophagitis: Secondary | ICD-10-CM | POA: Diagnosis present

## 2013-12-23 DIAGNOSIS — F3289 Other specified depressive episodes: Secondary | ICD-10-CM | POA: Diagnosis present

## 2013-12-23 DIAGNOSIS — E785 Hyperlipidemia, unspecified: Secondary | ICD-10-CM | POA: Diagnosis present

## 2013-12-23 DIAGNOSIS — H409 Unspecified glaucoma: Secondary | ICD-10-CM | POA: Diagnosis present

## 2013-12-23 DIAGNOSIS — E119 Type 2 diabetes mellitus without complications: Secondary | ICD-10-CM | POA: Diagnosis present

## 2013-12-23 DIAGNOSIS — R1310 Dysphagia, unspecified: Secondary | ICD-10-CM | POA: Diagnosis present

## 2013-12-23 DIAGNOSIS — F32A Depression, unspecified: Secondary | ICD-10-CM

## 2013-12-23 DIAGNOSIS — Z8673 Personal history of transient ischemic attack (TIA), and cerebral infarction without residual deficits: Secondary | ICD-10-CM | POA: Diagnosis not present

## 2013-12-23 DIAGNOSIS — R569 Unspecified convulsions: Secondary | ICD-10-CM

## 2013-12-23 DIAGNOSIS — M542 Cervicalgia: Secondary | ICD-10-CM | POA: Diagnosis not present

## 2013-12-23 DIAGNOSIS — R471 Dysarthria and anarthria: Secondary | ICD-10-CM | POA: Diagnosis present

## 2013-12-23 DIAGNOSIS — G609 Hereditary and idiopathic neuropathy, unspecified: Secondary | ICD-10-CM | POA: Diagnosis present

## 2013-12-23 DIAGNOSIS — R51 Headache: Secondary | ICD-10-CM | POA: Diagnosis not present

## 2013-12-23 DIAGNOSIS — H04129 Dry eye syndrome of unspecified lacrimal gland: Secondary | ICD-10-CM | POA: Diagnosis present

## 2013-12-23 DIAGNOSIS — I6350 Cerebral infarction due to unspecified occlusion or stenosis of unspecified cerebral artery: Secondary | ICD-10-CM | POA: Diagnosis not present

## 2013-12-23 DIAGNOSIS — I639 Cerebral infarction, unspecified: Secondary | ICD-10-CM

## 2013-12-23 DIAGNOSIS — R55 Syncope and collapse: Secondary | ICD-10-CM | POA: Diagnosis not present

## 2013-12-23 DIAGNOSIS — I6529 Occlusion and stenosis of unspecified carotid artery: Secondary | ICD-10-CM | POA: Diagnosis not present

## 2013-12-23 DIAGNOSIS — I4819 Other persistent atrial fibrillation: Secondary | ICD-10-CM | POA: Diagnosis present

## 2013-12-23 DIAGNOSIS — E039 Hypothyroidism, unspecified: Secondary | ICD-10-CM | POA: Diagnosis present

## 2013-12-23 DIAGNOSIS — I10 Essential (primary) hypertension: Secondary | ICD-10-CM | POA: Diagnosis not present

## 2013-12-23 DIAGNOSIS — R64 Cachexia: Secondary | ICD-10-CM | POA: Diagnosis present

## 2013-12-23 DIAGNOSIS — I480 Paroxysmal atrial fibrillation: Secondary | ICD-10-CM

## 2013-12-23 DIAGNOSIS — R6889 Other general symptoms and signs: Secondary | ICD-10-CM | POA: Diagnosis not present

## 2013-12-23 DIAGNOSIS — I672 Cerebral atherosclerosis: Secondary | ICD-10-CM | POA: Diagnosis not present

## 2013-12-23 LAB — CBC
HCT: 32.3 % — ABNORMAL LOW (ref 36.0–46.0)
Hemoglobin: 11 g/dL — ABNORMAL LOW (ref 12.0–15.0)
MCH: 30.7 pg (ref 26.0–34.0)
MCHC: 34.1 g/dL (ref 30.0–36.0)
MCV: 90.2 fL (ref 78.0–100.0)
Platelets: 199 10*3/uL (ref 150–400)
RBC: 3.58 MIL/uL — ABNORMAL LOW (ref 3.87–5.11)
RDW: 15.5 % (ref 11.5–15.5)
WBC: 5.8 10*3/uL (ref 4.0–10.5)

## 2013-12-23 LAB — I-STAT TROPONIN, ED: Troponin i, poc: 0.01 ng/mL (ref 0.00–0.08)

## 2013-12-23 LAB — COMPREHENSIVE METABOLIC PANEL
ALT: 16 U/L (ref 0–35)
AST: 22 U/L (ref 0–37)
Albumin: 4 g/dL (ref 3.5–5.2)
Alkaline Phosphatase: 79 U/L (ref 39–117)
Anion gap: 11 (ref 5–15)
BUN: 26 mg/dL — ABNORMAL HIGH (ref 6–23)
CO2: 26 mEq/L (ref 19–32)
Calcium: 10 mg/dL (ref 8.4–10.5)
Chloride: 96 mEq/L (ref 96–112)
Creatinine, Ser: 1.5 mg/dL — ABNORMAL HIGH (ref 0.50–1.10)
GFR calc Af Amer: 35 mL/min — ABNORMAL LOW (ref >90)
GFR calc non Af Amer: 30 mL/min — ABNORMAL LOW (ref >90)
Glucose, Bld: 144 mg/dL — ABNORMAL HIGH (ref 70–99)
Potassium: 4.6 mEq/L (ref 3.7–5.3)
Sodium: 133 mEq/L — ABNORMAL LOW (ref 137–147)
Total Bilirubin: 0.3 mg/dL (ref 0.3–1.2)
Total Protein: 7.6 g/dL (ref 6.0–8.3)

## 2013-12-23 LAB — CBG MONITORING, ED: Glucose-Capillary: 143 mg/dL — ABNORMAL HIGH (ref 70–99)

## 2013-12-23 LAB — APTT: aPTT: 29 seconds (ref 24–37)

## 2013-12-23 LAB — PROTIME-INR
INR: 1.06 (ref 0.00–1.49)
Prothrombin Time: 13.8 seconds (ref 11.6–15.2)

## 2013-12-23 MED ORDER — ACETAMINOPHEN 500 MG PO TABS: 500.0000 mg | ORAL_TABLET | Freq: Four times a day (QID) | ORAL | Status: DC | PRN

## 2013-12-23 MED ORDER — SERTRALINE HCL 100 MG PO TABS
100.0000 mg | ORAL_TABLET | Freq: Every day | ORAL | Status: DC
  Administered 2013-12-25 – 2013-12-26 (×2): 100 mg via ORAL
  Filled 2013-12-23 (×3): qty 1

## 2013-12-23 MED ORDER — ATORVASTATIN CALCIUM 10 MG PO TABS
10.0000 mg | ORAL_TABLET | Freq: Every day | ORAL | Status: DC
  Administered 2013-12-24 – 2013-12-25 (×2): 10 mg via ORAL
  Filled 2013-12-23 (×3): qty 1

## 2013-12-23 MED ORDER — SUCRALFATE 1 G PO TABS
1.0000 g | ORAL_TABLET | Freq: Three times a day (TID) | ORAL | Status: DC
  Administered 2013-12-24 – 2013-12-26 (×6): 1 g via ORAL
  Filled 2013-12-23 (×10): qty 1

## 2013-12-23 MED ORDER — CYCLOSPORINE 0.05 % OP EMUL
1.0000 [drp] | Freq: Two times a day (BID) | OPHTHALMIC | Status: DC
  Administered 2013-12-24 – 2013-12-26 (×5): 1 [drp] via OPHTHALMIC
  Filled 2013-12-23 (×7): qty 1

## 2013-12-23 MED ORDER — SODIUM CHLORIDE 0.9 % IJ SOLN
3.0000 mL | Freq: Two times a day (BID) | INTRAMUSCULAR | Status: DC
  Administered 2013-12-24 – 2013-12-26 (×5): 3 mL via INTRAVENOUS

## 2013-12-23 MED ORDER — LEVETIRACETAM 250 MG PO TABS
250.0000 mg | ORAL_TABLET | Freq: Two times a day (BID) | ORAL | Status: DC
  Administered 2013-12-24 – 2013-12-26 (×4): 250 mg via ORAL
  Filled 2013-12-23 (×7): qty 1

## 2013-12-23 MED ORDER — HYDRALAZINE HCL 25 MG PO TABS
25.0000 mg | ORAL_TABLET | Freq: Two times a day (BID) | ORAL | Status: DC
  Administered 2013-12-24 – 2013-12-26 (×4): 25 mg via ORAL
  Filled 2013-12-23 (×7): qty 1

## 2013-12-23 MED ORDER — FLUTICASONE PROPIONATE 50 MCG/ACT NA SUSP
2.0000 | Freq: Every day | NASAL | Status: DC
  Administered 2013-12-25 – 2013-12-26 (×2): 2 via NASAL
  Filled 2013-12-23: qty 16

## 2013-12-23 MED ORDER — AMLODIPINE BESYLATE 10 MG PO TABS
10.0000 mg | ORAL_TABLET | Freq: Every day | ORAL | Status: DC
Start: 2013-12-24 — End: 2013-12-26
  Administered 2013-12-25 – 2013-12-26 (×2): 10 mg via ORAL
  Filled 2013-12-23 (×3): qty 1

## 2013-12-23 MED ORDER — SODIUM CHLORIDE 0.9 % IV SOLN: 250.0000 mL | INTRAVENOUS | Status: DC | PRN

## 2013-12-23 MED ORDER — LEVOTHYROXINE SODIUM 175 MCG PO TABS
175.0000 ug | ORAL_TABLET | Freq: Every day | ORAL | Status: DC
  Administered 2013-12-25 – 2013-12-26 (×2): 175 ug via ORAL
  Filled 2013-12-23 (×4): qty 1

## 2013-12-23 MED ORDER — ASPIRIN 325 MG PO TABS
325.0000 mg | ORAL_TABLET | Freq: Every day | ORAL | Status: DC
  Administered 2013-12-25 – 2013-12-26 (×2): 325 mg via ORAL
  Filled 2013-12-23 (×3): qty 1

## 2013-12-23 MED ORDER — ISOSORBIDE MONONITRATE ER 30 MG PO TB24
30.0000 mg | ORAL_TABLET | Freq: Every day | ORAL | Status: DC
  Administered 2013-12-25 – 2013-12-26 (×2): 30 mg via ORAL
  Filled 2013-12-23 (×3): qty 1

## 2013-12-23 MED ORDER — L-METHYLFOLATE-B6-B12 3-35-2 MG PO TABS
1.0000 | ORAL_TABLET | Freq: Every day | ORAL | Status: DC
  Administered 2013-12-25 – 2013-12-26 (×2): 1 via ORAL
  Filled 2013-12-23 (×3): qty 1

## 2013-12-23 MED ORDER — STROKE: EARLY STAGES OF RECOVERY BOOK
Freq: Once | Status: DC
  Filled 2013-12-23: qty 1

## 2013-12-23 MED ORDER — SODIUM CHLORIDE 0.9 % IV SOLN: INTRAVENOUS | Status: DC

## 2013-12-23 MED ORDER — SODIUM CHLORIDE 0.9 % IJ SOLN: 3.0000 mL | INTRAMUSCULAR | Status: DC | PRN

## 2013-12-23 MED ORDER — INFLUENZA VAC SPLIT QUAD 0.5 ML IM SUSY
0.5000 mL | PREFILLED_SYRINGE | INTRAMUSCULAR | Status: AC
  Administered 2013-12-24: 0.5 mL via INTRAMUSCULAR
  Filled 2013-12-23: qty 0.5

## 2013-12-23 MED ORDER — PANTOPRAZOLE SODIUM 40 MG PO TBEC
40.0000 mg | DELAYED_RELEASE_TABLET | Freq: Every day | ORAL | Status: DC
  Administered 2013-12-25 – 2013-12-26 (×2): 40 mg via ORAL
  Filled 2013-12-23 (×2): qty 1

## 2013-12-23 MED ORDER — ASPIRIN 300 MG RE SUPP
300.0000 mg | Freq: Every day | RECTAL | Status: DC
  Filled 2013-12-23 (×3): qty 1

## 2013-12-23 MED ORDER — GABAPENTIN 300 MG PO CAPS
300.0000 mg | ORAL_CAPSULE | Freq: Three times a day (TID) | ORAL | Status: DC
  Administered 2013-12-24 – 2013-12-26 (×7): 300 mg via ORAL
  Filled 2013-12-23 (×10): qty 1

## 2013-12-23 MED ORDER — METOPROLOL SUCCINATE ER 50 MG PO TB24
50.0000 mg | ORAL_TABLET | Freq: Every day | ORAL | Status: DC
  Administered 2013-12-25 – 2013-12-26 (×2): 50 mg via ORAL
  Filled 2013-12-23 (×3): qty 1

## 2013-12-23 NOTE — ED Provider Notes (Signed)
CSN: [id number]     Arrival date & time 12/23/13  1937 History   First MD Initiated Contact with Patient 12/23/13 1938     Chief Complaint  Patient presents with  . Headache     (Consider location/radiation/quality/duration/timing/severity/associated sxs/prior Treatment) HPI The patient was living in [place], she was in rehabilitation from June 6 through July 15 after having a ulcer, hernia, and surgery for a twisted intestine. She states she was just released from her wound care doctor last week. The patient's husband also died on 12/21/22 and her daughter states she's been having a difficult time dealing with that and her own health issues. Patient was seen a neurologist in [place] who took her off Depakote because she was getting tremors and put her on Keppra. She is currently tapering off the Keppra however the tremor is not improving. She states today she felt weak and broke out into a sweat, she states "I didn't feel right in the head". She complains of pain in her left neck just behind her left ear that radiates into her left shoulder. She states she had a similar pain when she had a heart attack over 10 years ago while living in [place]. She describes having balloon angioplasty done at that time. She states she had felt fine all day until about 18:10. She also states she felt like she was having trouble breathing and felt like she was going to pass out. She denies any nausea or vomiting. She also was noted to start having trouble speaking with the same event. Patient complains of chronic blurred vision, she has glaucoma. She is supposed to have surgery on October 19 to have scar tissue removed from her eyes.    PCP Dr Bubba Camp  Past Medical History  Diagnosis Date  . Pneumonia   . Thyroid disease   . Seizures   . Stroke   . Hyperlipidemia   . Hypertension   . Diabetes mellitus without complication   . Small bowel obstruction   . Ventral hernia with bowel obstruction     Past Surgical History  Procedure Laterality Date  . Exploratory laparotomy  09-07-2013  . Cholecystectomy    . Tubal ligation    . Colectomy    . Hernia repair  2015   No family history on file. History  Substance Use Topics  . Smoking status: Never Smoker   . Smokeless tobacco: Not on file  . Alcohol Use: No   Lives at home Lives with daughter Uses a walker  OB History   Grav Para Term Preterm Abortions TAB SAB Ect Mult Living                 Review of Systems  All other systems reviewed and are negative.     Allergies  Review of patient's allergies indicates no known allergies.  Home Medications   Prior to Admission medications   Medication Sig Start Date End Date Taking? Authorizing Provider  acetaminophen (TYLENOL) 325 MG tablet Take 325 mg by mouth as needed.    Historical Provider, MD  amLODipine (NORVASC) 10 MG tablet Take 10 mg by mouth daily.    Historical Provider, MD  atorvastatin (LIPITOR) 10 MG tablet Take 10 mg by mouth daily at 6 PM.    Historical Provider, MD  cycloSPORINE (RESTASIS) 0.05 % ophthalmic emulsion Place 1 drop into both eyes 2 (two) times daily.    Historical Provider, MD  docusate sodium (COLACE) 100 MG capsule Take 100 mg by  mouth 2 (two) times daily.    Historical Provider, MD  fluticasone (FLONASE) 50 MCG/ACT nasal spray Place 2 sprays into both nostrils daily.    Historical Provider, MD  gabapentin (NEURONTIN) 300 MG capsule Take 300 mg by mouth 3 (three) times daily.    Historical Provider, MD  hydrALAZINE (APRESOLINE) 25 MG tablet Take 25 mg by mouth 3 (three) times daily.    Historical Provider, MD  isosorbide mononitrate (IMDUR) 30 MG 24 hr tablet Take 30 mg by mouth daily.    Historical Provider, MD  l-methylfolate-B6-B12 (METANX) 3-35-2 MG TABS Take 1 tablet by mouth daily.    Historical Provider, MD  levETIRAcetam (KEPPRA) 250 MG tablet Take 250 mg by mouth 2 (two) times daily.    Historical Provider, MD  levothyroxine  (SYNTHROID, LEVOTHROID) 175 MCG tablet Take 175 mcg by mouth daily before breakfast.    Historical Provider, MD  losartan (COZAAR) 100 MG tablet Take 100 mg by mouth daily.    Historical Provider, MD  magnesium oxide (MAG-OX) 400 MG tablet Take 1 tablet (400 mg total) by mouth 2 (two) times daily. 10/06/13   Gerlene Fee, NP  metoprolol succinate (TOPROL-XL) 50 MG 24 hr tablet Take 50 mg by mouth daily. Take with or immediately following a meal.    Historical Provider, MD  Multiple Vitamins-Minerals (MULTIVITAMIN WITH MINERALS) tablet Take 1 tablet by mouth daily.    Historical Provider, MD  pantoprazole (PROTONIX) 40 MG tablet Take 40 mg by mouth daily.    Historical Provider, MD  sennosides-docusate sodium (SENOKOT-S) 8.6-50 MG tablet Take 2 tablets by mouth 2 (two) times daily.    Historical Provider, MD  sertraline (ZOLOFT) 100 MG tablet Take 100 mg by mouth daily.    Historical Provider, MD  sucralfate (CARAFATE) 1 G tablet Take 1 g by mouth 3 (three) times daily with meals.    Historical Provider, MD  vitamin B-12 (CYANOCOBALAMIN) 1000 MCG tablet Take 1,000 mcg by mouth daily.    Historical Provider, MD   BP 153/54  Temp(Src) 98 F (36.7 C) (Oral)  Resp 16  Wt 151 lb (68.493 kg)  SpO2 99%  Vital signs normal   Physical Exam  Nursing note and vitals reviewed. Constitutional: She is oriented to person, place, and time. She appears well-developed and well-nourished.  Non-toxic appearance. She does not appear ill. No distress.  HENT:  Head: Normocephalic and atraumatic.  Right Ear: External ear normal.  Left Ear: External ear normal.  Nose: Nose normal. No mucosal edema or rhinorrhea.  Mouth/Throat: Oropharynx is clear and moist and mucous membranes are normal. No dental abscesses or uvula swelling.  Eyes: Conjunctivae and EOM are normal. Pupils are equal, round, and reactive to light.  Neck: Normal range of motion and full passive range of motion without pain. Neck supple.   Cardiovascular: Normal rate, regular rhythm and normal heart sounds.  Exam reveals no gallop and no friction rub.   No murmur heard. Pulmonary/Chest: Effort normal and breath sounds normal. No respiratory distress. She has no wheezes. She has no rhonchi. She has no rales. She exhibits no tenderness and no crepitus.  Abdominal: Soft. Normal appearance and bowel sounds are normal. She exhibits no distension. There is no tenderness. There is no rebound and no guarding.  Musculoskeletal: Normal range of motion. She exhibits no edema and no tenderness.  Moves all extremities well.   Neurological: She is alert and oriented to person, place, and time. She has normal strength. No cranial  nerve deficit.  I did not detect any facial asymmetry. Patient had no pronator drift although she did have tremor of both hands, patient had difficulty doing heel-to-shin with her left leg but did it well with her right leg, her grips seem to equal bilaterally. Patient noted to have slurred speech.  Skin: Skin is warm, dry and intact. No rash noted. No erythema. No pallor.  Psychiatric: She has a normal mood and affect. Her speech is normal and behavior is normal. Her mood appears not anxious.    ED Course  Procedures (including critical care time) Medications  0.9 %  sodium chloride infusion (not administered)     Code stroke called.  21:00 Dr Leonel Ramsay has evaluated patient, states her NIH Stroke Score is 5. He states the patient started feeling different about 5 pm but didn't talk to her family until about 6:10.  He feels she has had a stroke and should be admitted to the medicine service.   21:30 Dr Shanon Brow, admit to tele, team 10  Labs Review Results for orders placed during the hospital encounter of 12/23/13  CBC      Result Value Ref Range   WBC 5.8  4.0 - 10.5 K/uL   RBC 3.58 (*) 3.87 - 5.11 MIL/uL   Hemoglobin 11.0 (*) 12.0 - 15.0 g/dL   HCT 32.3 (*) 36.0 - 46.0 %   MCV 90.2  78.0 - 100.0 fL    MCH 30.7  26.0 - 34.0 pg   MCHC 34.1  30.0 - 36.0 g/dL   RDW 15.5  11.5 - 15.5 %   Platelets 199  150 - 400 K/uL  PROTIME-INR      Result Value Ref Range   Prothrombin Time 13.8  11.6 - 15.2 seconds   INR 1.06  0.00 - 1.49  APTT      Result Value Ref Range   aPTT 29  24 - 37 seconds  COMPREHENSIVE METABOLIC PANEL      Result Value Ref Range   Sodium 133 (*) 137 - 147 mEq/L   Potassium 4.6  3.7 - 5.3 mEq/L   Chloride 96  96 - 112 mEq/L   CO2 26  19 - 32 mEq/L   Glucose, Bld 144 (*) 70 - 99 mg/dL   BUN 26 (*) 6 - 23 mg/dL   Creatinine, Ser 1.50 (*) 0.50 - 1.10 mg/dL   Calcium 10.0  8.4 - 10.5 mg/dL   Total Protein 7.6  6.0 - 8.3 g/dL   Albumin 4.0  3.5 - 5.2 g/dL   AST 22  0 - 37 U/L   ALT 16  0 - 35 U/L   Alkaline Phosphatase 79  39 - 117 U/L   Total Bilirubin 0.3  0.3 - 1.2 mg/dL   GFR calc non Af Amer 30 (*) >90 mL/min   GFR calc Af Amer 35 (*) >90 mL/min   Anion gap 11  5 - 15  I-STAT TROPOININ, ED      Result Value Ref Range   Troponin i, poc 0.01  0.00 - 0.08 ng/mL   Comment 3           CBG MONITORING, ED      Result Value Ref Range   Glucose-Capillary 143 (*) 70 - 99 mg/dL    Laboratory interpretation all normal except renal insufficiency, mild anemia    Imaging Review Ct Head Wo Contrast  12/23/2013   CLINICAL DATA:  Slurred speech  EXAM: CT HEAD WITHOUT CONTRAST  TECHNIQUE: Contiguous axial images were obtained from the base of the skull through the vertex without intravenous contrast.  COMPARISON:  None.  FINDINGS: There is no evidence of mass effect, midline shift, or extra-axial fluid collections. There is no evidence of a space-occupying lesion or intracranial hemorrhage. There is no evidence of a cortical-based area of acute infarction. There is generalized cerebral atrophy. There is periventricular white matter low attenuation likely secondary to microangiopathy.  The ventricles and sulci are appropriate for the patient's age. The basal cisterns are patent.   Visualized portions of the orbits are unremarkable. The visualized portions of the paranasal sinuses and mastoid air cells are unremarkable. Cerebrovascular atherosclerotic calcifications are noted.  The osseous structures are unremarkable.  IMPRESSION: No acute intracranial pathology.  These results were called by telephone at the time of interpretation on 12/23/2013 at 8:50 pm to Dr. Rolland Porter , who verbally acknowledged these results.   Electronically Signed   By: Kathreen Devoid   On: 12/23/2013 20:50     EKG Interpretation   Date/Time:  Tuesday December 23 2013 20:41:19 EDT Ventricular Rate:  70 PR Interval:  191 QRS Duration: 71 QT Interval:  389 QTC Calculation: 420 R Axis:   86 Text Interpretation:  Age not entered, assumed to be  80 years old for  purpose of ECG interpretation Sinus rhythm Probable left atrial  enlargement Electrode noise No old tracing to compare Confirmed by [place]   MD-I, Arlyne Brandes ([id number]) on 12/23/2013 9:20:27 PM      MDM   Final diagnoses:  Slurred speech  Stroke    Plan admission   Rolland Porter, MD, Alanson Aly, MD 12/23/13 2138

## 2013-12-23 NOTE — ED Notes (Addendum)
Pt to ED via [facility] from home- family reports pt was sitting at dinner table when she had a syncopal episode and slumped over in chair- family assisted pt to floor- pt incontinent of urine. CBG 174.  When pt became alert she reported left sided headache and neck pain, reports pain in neck has been present for a few months.  Pt alert and oriented X 4 upon arrival to ED, family reports pt speech is baseline.  Neuro exam negative at present.

## 2013-12-23 NOTE — ED Notes (Signed)
Attempted report X1

## 2013-12-23 NOTE — ED Notes (Signed)
EDP at bedside- family reports that speech today is not pt normal.  This RN into verify that family now agrees with this statement.  LSN at 1810- code stroke activated.

## 2013-12-23 NOTE — H&P (Signed)
PCP:   Blanchie Serve, MD   Chief Complaint:  Slurred speech  HPI: 80 yo female h/o cva, ?seizure disorder, htn, hld brought in by her daughter for a episode of slurred speech earlier today and not acting right.  She was sitting at the table when she suddenly became diaphoretic, weak and slurred speech.  911 was called.  Pt was brought in and code stroke was started.  Pt did have recent surgery at [facility] to fix a hernia and an ulcer in her stomach.  No recent illnesses or fevers.  Pt denies any problem with her arms or legs, but is still having slurred speech.  Review of Systems:  Positive and negative as per HPI otherwise all other systems are negative  Past Medical History: Past Medical History  Diagnosis Date  . Pneumonia   . Thyroid disease   . Seizures   . Stroke   . Hyperlipidemia   . Hypertension   . Diabetes mellitus without complication   . Small bowel obstruction   . Ventral hernia with bowel obstruction    Past Surgical History  Procedure Laterality Date  . Exploratory laparotomy  09-07-2013  . Cholecystectomy    . Tubal ligation    . Colectomy    . Hernia repair  2015    Medications: Prior to Admission medications   Medication Sig Start Date End Date Taking? Authorizing Provider  acetaminophen (TYLENOL) 325 MG tablet Take 325 mg by mouth as needed.    Historical Provider, MD  amLODipine (NORVASC) 10 MG tablet Take 10 mg by mouth daily.    Historical Provider, MD  atorvastatin (LIPITOR) 10 MG tablet Take 10 mg by mouth daily at 6 PM.    Historical Provider, MD  cycloSPORINE (RESTASIS) 0.05 % ophthalmic emulsion Place 1 drop into both eyes 2 (two) times daily.    Historical Provider, MD  docusate sodium (COLACE) 100 MG capsule Take 100 mg by mouth 2 (two) times daily.    Historical Provider, MD  fluticasone (FLONASE) 50 MCG/ACT nasal spray Place 2 sprays into both nostrils daily.    Historical Provider, MD  gabapentin (NEURONTIN) 300 MG capsule Take 300 mg  by mouth 3 (three) times daily.    Historical Provider, MD  hydrALAZINE (APRESOLINE) 25 MG tablet Take 25 mg by mouth 3 (three) times daily.    Historical Provider, MD  isosorbide mononitrate (IMDUR) 30 MG 24 hr tablet Take 30 mg by mouth daily.    Historical Provider, MD  l-methylfolate-B6-B12 (METANX) 3-35-2 MG TABS Take 1 tablet by mouth daily.    Historical Provider, MD  levETIRAcetam (KEPPRA) 250 MG tablet Take 250 mg by mouth 2 (two) times daily.    Historical Provider, MD  levothyroxine (SYNTHROID, LEVOTHROID) 175 MCG tablet Take 175 mcg by mouth daily before breakfast.    Historical Provider, MD  losartan (COZAAR) 100 MG tablet Take 100 mg by mouth daily.    Historical Provider, MD  magnesium oxide (MAG-OX) 400 MG tablet Take 1 tablet (400 mg total) by mouth 2 (two) times daily. 10/06/13   Gerlene Fee, NP  metoprolol succinate (TOPROL-XL) 50 MG 24 hr tablet Take 50 mg by mouth daily. Take with or immediately following a meal.    Historical Provider, MD  Multiple Vitamins-Minerals (MULTIVITAMIN WITH MINERALS) tablet Take 1 tablet by mouth daily.    Historical Provider, MD  pantoprazole (PROTONIX) 40 MG tablet Take 40 mg by mouth daily.    Historical Provider, MD  sennosides-docusate sodium (  SENOKOT-S) 8.6-50 MG tablet Take 2 tablets by mouth 2 (two) times daily.    Historical Provider, MD  sertraline (ZOLOFT) 100 MG tablet Take 100 mg by mouth daily.    Historical Provider, MD  sucralfate (CARAFATE) 1 G tablet Take 1 g by mouth 3 (three) times daily with meals.    Historical Provider, MD  vitamin B-12 (CYANOCOBALAMIN) 1000 MCG tablet Take 1,000 mcg by mouth daily.    Historical Provider, MD    Allergies:  No Known Allergies  Social History:  reports that she has never smoked. She does not have any smokeless tobacco history on file. She reports that she does not drink alcohol or use illicit drugs.  Family History: none  Physical Exam: Filed Vitals:   12/23/13 1950 12/23/13 2124   BP: 153/54 124/85  Pulse:  62  Temp: 98 F (36.7 C)   TempSrc: Oral   Resp: 16 16  Weight: 68.493 kg (151 lb)   SpO2: 99% 100%   General appearance: alert, cooperative and no distress Head: Normocephalic, without obvious abnormality, atraumatic Eyes: negative Nose: Nares normal. Septum midline. Mucosa normal. No drainage or sinus tenderness. Neck: no JVD and supple, symmetrical, trachea midline Lungs: clear to auscultation bilaterally Heart: regular rate and rhythm, S1, S2 normal, no murmur, click, rub or gallop Abdomen: soft, non-tender; bowel sounds normal; no masses,  no organomegaly Extremities: extremities normal, atraumatic, no cyanosis or edema Pulses: 2+ and symmetric Skin: Skin color, texture, turgor normal. No rashes or lesions Neurologic: Grossly normal x dysarthria noted.  No facial drooping noted.   Labs on Admission:   Recent Labs  12/23/13 2019  NA 133*  K 4.6  CL 96  CO2 26  GLUCOSE 144*  BUN 26*  CREATININE 1.50*  CALCIUM 10.0    Recent Labs  12/23/13 2019  AST 22  ALT 16  ALKPHOS 79  BILITOT 0.3  PROT 7.6  ALBUMIN 4.0    Recent Labs  12/23/13 2019  WBC 5.8  HGB 11.0*  HCT 32.3*  MCV 90.2  PLT 199   Radiological Exams on Admission: Ct Head Wo Contrast  12/23/2013   CLINICAL DATA:  Slurred speech  EXAM: CT HEAD WITHOUT CONTRAST  TECHNIQUE: Contiguous axial images were obtained from the base of the skull through the vertex without intravenous contrast.  COMPARISON:  None.  FINDINGS: There is no evidence of mass effect, midline shift, or extra-axial fluid collections. There is no evidence of a space-occupying lesion or intracranial hemorrhage. There is no evidence of a cortical-based area of acute infarction. There is generalized cerebral atrophy. There is periventricular white matter low attenuation likely secondary to microangiopathy.  The ventricles and sulci are appropriate for the patient's age. The basal cisterns are patent.   Visualized portions of the orbits are unremarkable. The visualized portions of the paranasal sinuses and mastoid air cells are unremarkable. Cerebrovascular atherosclerotic calcifications are noted.  The osseous structures are unremarkable.  IMPRESSION: No acute intracranial pathology.  These results were called by telephone at the time of interpretation on 12/23/2013 at 8:50 pm to Dr. Rolland Porter , who verbally acknowledged these results.   Electronically Signed   By: Kathreen Devoid   On: 12/23/2013 20:50    Assessment/Plan  80 yo female with dysarthria/slurred speech possible cva  Principal Problem:   Slurred speech-  Full cva workup.  Aspirin.  Neuro following and recommend plavix, but due to her recent gi surgery will hold off on that for now until [facility] records fully evaluated.  Admit to tele bed.  Active Problems:  Stable unless o/w noted   Essential hypertension, benign   Seizures-  Sounds like she may not have an actual diagnosis of seizures but tremor.   Dyslipidemia   A-fib  Admit to tele.  Full code.  Wilma Wuthrich A 12/23/2013, 9:32 PM

## 2013-12-23 NOTE — ED Notes (Signed)
Admitting physician at bedside

## 2013-12-23 NOTE — Progress Notes (Signed)
80 y.o female CODE STROKE last seen well at 1700 while cooking dinner. Per pt she began feeling pain in left ear, down her neck and left shoulder and "feeling funny". Daughter at bedside noticed pt having weakness and slurred speech during dinnertime and called EMS. CT done emergently, negative. NIHSS done yielding 3 for mild dysarthria, ataxia left limb, and slight left facial droop. Pt history includes past stroke with no residuals, seizures, hypertension,diabetes, balloon angioplasty,GI ulcers and elevated lipids. CBG 143. Pt 80 years old with mild symptoms, now out of window for acute treatments. To be admitted per [facility] tonight.

## 2013-12-23 NOTE — Consult Note (Addendum)
Neurology Consultation Reason for Consult: Slurred speech Referring Physician: Tomi Bamberger, I  CC: Slurred speech  History is obtained from: Patient, daughter  HPI: Shannon [name] is a 80 y.o. female with a history of DM, stroke who presents with sudden onset slurred speech and difficulty walking this started around 5 PM. She states that she noticed that she was feeling funny around this time, then sat down to eat dinner. During dinner, she did not speak much because she was concentrating on chewing. She then had a presyncopal episode, btu did not pass out completely. After this,  it was noticed that she was slurring her speech. Her daughter therefore called 911. She also noted right neck pain of several weeks duration.    LKW: 5 PM tpa given?: no, outside of window(patient is 80 years old)    ROS: A 14 point ROS was performed and is negative except as noted in the HPI.   Past Medical History  Diagnosis Date  . Pneumonia   . Thyroid disease   . Seizures   . Stroke   . Hyperlipidemia   . Hypertension   . Diabetes mellitus without complication   . Small bowel obstruction   . Ventral hernia with bowel obstruction     Family History: Mother-stroke  Social History: Tob: Denies Lives with daughter  Exam: Current vital signs: BP 153/54  Temp(Src) 98 F (36.7 C) (Oral)  Resp 16  Wt 68.493 kg (151 lb)  SpO2 99% Vital signs in last 24 hours: Temp:  [98 F (36.7 C)] 98 F (36.7 C) (09/22 1950) Resp:  [16] 16 (09/22 1950) BP: (153)/(54) 153/54 mmHg (09/22 1950) SpO2:  [99 %] 99 % (09/22 1950) Weight:  [68.493 kg (151 lb)] 68.493 kg (151 lb) (09/22 1950)  General: In bed, NAD CV: Regular rate and rhythm Mental Status: Patient is awake, alert, oriented to person, place, month, year, and situation. Immediate and remote memory are intact. Patient is able to give a clear and coherent history. No signs of aphasia or neglect She hasn't mild dysarthria Cranial Nerves: II: Visual  Fields are full. Pupils are equal, round, and reactive to light.  Discs are difficult to visualize. III,IV, VI: EOMI without ptosis or diploplia.  V: Facial sensation is symmetric to temperature VII: Facial movement is notable for mild decreased nasal labial fold on the left VIII: hearing is intact to voice X: Uvula elevates symmetrically XI: Shoulder shrug is symmetric. XII: tongue is midline without atrophy or fasciculations.  Motor: Tone is normal. Bulk is normal. 5/5 strength was present in all four extremities.  Sensory: Sensation is symmetric to light touch and temperature in the arms and legs. Deep Tendon Reflexes: 2+ and symmetric in the biceps and patellae.  Plantars: Toes are downgoing bilaterally.  Cerebellar: She has a marked postural and intentional tremor bilaterally, but in addition she has passpointing and what appears to be ataxia on the left Gait: Not assessed due to acute nature of evaluation and multiple medical monitors in ED setting.   I have reviewed labs in epic and the results pertinent to this consultation are: CBC-unremarkable  I have reviewed the images obtained: CT head - no acute findings  Impression: 80 yo F with new onset dysarthria, left sided dyscoordination and difficulty walking. The presyncope could be related. I suspect tha tshe has had a small brainstem or cerebellar infarct and will need workup for such. Her neck pain could be related to a cervical vessel dissection and she will therefore need  cervical vascular imaging. She has a history of atrial fibrillation, but I am not clear to what extent her GI issues still present a bleeding risk.   Recommendations: 1. HgbA1c, fasting lipid panel 2. MRI, MRA  of the brain without contrast, MRA neck w/wo contrast 3. Frequent neuro checks 4. Echocardiogram 5. Prophylactic therapy- would discuss with GI given recent repeated surgery for ulcer, but given that she has been treated, would favor starting  plavix 75mg  daily.  6. Risk factor modification 7. Telemetry monitoring 8. PT consult, OT consult, Speech consult    Roland Rack, MD [facility] [phone]  If 7pm- 7am, please page neurology on call as listed in [place].

## 2013-12-24 ENCOUNTER — Encounter (HOSPITAL_COMMUNITY): Payer: Self-pay | Admitting: Nurse Practitioner

## 2013-12-24 ENCOUNTER — Inpatient Hospital Stay (HOSPITAL_COMMUNITY): Payer: Medicare Other

## 2013-12-24 DIAGNOSIS — E1159 Type 2 diabetes mellitus with other circulatory complications: Secondary | ICD-10-CM

## 2013-12-24 DIAGNOSIS — R55 Syncope and collapse: Principal | ICD-10-CM

## 2013-12-24 LAB — HEMOGLOBIN A1C
Hgb A1c MFr Bld: 6.9 % — ABNORMAL HIGH (ref <5.7)
Mean Plasma Glucose: 151 mg/dL — ABNORMAL HIGH (ref <117)

## 2013-12-24 LAB — LIPID PANEL
Cholesterol: 110 mg/dL (ref 0–200)
HDL: 58 mg/dL
LDL Cholesterol: 26 mg/dL (ref 0–99)
Total CHOL/HDL Ratio: 1.9 ratio
Triglycerides: 130 mg/dL
VLDL: 26 mg/dL (ref 0–40)

## 2013-12-24 MED ORDER — TRAMADOL HCL 50 MG PO TABS
50.0000 mg | ORAL_TABLET | Freq: Two times a day (BID) | ORAL | Status: DC | PRN
  Administered 2013-12-24 – 2013-12-26 (×3): 50 mg via ORAL
  Filled 2013-12-24 (×4): qty 1

## 2013-12-24 MED ORDER — HYDRALAZINE HCL 20 MG/ML IJ SOLN
5.0000 mg | Freq: Four times a day (QID) | INTRAMUSCULAR | Status: DC | PRN
  Administered 2013-12-24: 5 mg via INTRAVENOUS
  Filled 2013-12-24 (×2): qty 1

## 2013-12-24 MED ORDER — TRAMADOL HCL 50 MG PO TABS: 50.0000 mg | ORAL_TABLET | Freq: Four times a day (QID) | ORAL | Status: DC | PRN

## 2013-12-24 NOTE — Progress Notes (Signed)
SLP Cancellation Note  Patient Details Name: Blessin Geerts MRN: [id number] DOB: 07/09/1923   Cancelled treatment:        Attempted to see patient for swallow evaluation.  Currently of the floor for MRI.  Will return later today.   Quinn Axe T 12/24/2013, 9:58 AM

## 2013-12-24 NOTE — Progress Notes (Signed)
[facility] PROGRESS NOTE  Shannon [name] ZOX:[id number] DOB: July 01, 1923 DOA: 12/23/2013 PCP: Oneal Grout, MD  Assessment/Plan:  Presyncope -Likely vasovagal response from severe neck pain- did not recur. -MRI of brain- no acute abnormalities -Appreciate Neurology consult- Not on antiplatelet therapy- previously on Plavix but stopped due to GI bleed start ASA 325 mg PO -Continue Keppra 250 mg PO -Continue telemetry -Frequent neurological checks -PT- home health PT -Swallow Eval- patient failed swallow eval in ED and thus PO HTN medications stopped and placed on IV hydralazine -OT/speech- pending  Neck Pain -Presented with neck pain for the past few weeks. -MRA neck- suspicion for ICA stenosis and high grade stenoses of left ACA with poor flow in vessel- carotid doppler to confirm -Carotid Doppler pending  Atrial Fibrillation -rate controlled -Appreciate Neurology- patient is frail and weak and not anticoagulant candidate  History of CVA -Previously on Plavix but stopped due to recent GIB -Appreciate Neurology consult- placed on ASA 325 mg PO  Hypothyroidism -Continue Synthroid  Hyperlipidemia - Lipid panel with LDL 26, HDL 58 -Continue Lipitor  Essential Hypertension, Benign -BPs elevated -Failed swallowing evaluation- placed on IV hydralazine 5 mg  CHF -Continue Nitrate, Amlodipine, BB  Depression -Continue Zoloft  Type II diabetes -Hgb A1c 6.9 -Patient not known diabetic, and previous HgA1c to compare. Doesn't meet criteria for initiation of Metformin (GFR 30, creatinine 1.50) -Recommend outpatient follow up with PCP for further assessment and management of diabetes  Peripheral neuropathy -Continue Neurontin, and Methylfolate  History of perforated ulcer -recent GIB and perforated ulcer, Not on Plavix -Continue Sucralafate, and protonix  Dry eyes -Continue Restasis  Allergies -Continue Flonase   DVT Prophylaxis SCD Code Status: Full Family  Communication: No family at bedside Disposition Plan: Inpatient   Consultants:  Neurology  Procedures:  None  Antibiotics:  None  HPI/Subjective: Shannon [name] is a 80 y.o. female with a history previous stroke, Atrial fibrillation (no on anticoagulation due to recent GI bleed), HTN, and GI ulcer, admitted 12/23/13, who presented with sudden onset slurred speech and difficulty walking this started day of admission. She states that she noticed that she wasn't feeling well and when she was eating dinner, she felt she needed to concentrate to be able to chew the food.  She then had a presyncopal episode and had slurred speech, but did not lose consciousness. She noted sever right neck pain of several weeks duration that had also progressively been getting worse. Patient is admitted for further workup.  Complains of nasal allergies and runny eyes. No recurrent syncopal episode    Objective: Filed Vitals:   12/24/13 0806  BP: 159/57  Pulse: 60  Temp:   Resp:    No intake or output data in the 24 hours ending 12/24/13 1152 Filed Weights   12/23/13 1950  Weight: 68.493 kg (151 lb)    Exam:  Gen: Chronically sick appearing female in NAD. Appears cachectic  HEENT: Normocephalic, atraumatic.  Pupils symmertrical.  Dry mucosa.  Slow slurred speech Chest: clear to auscultate bilaterally, no ronchi or rales  Cardiac: Regular rate and rhythm, S1-S2, no rubs murmurs or gallops  Abdomen: soft, non tender, non distended, +bowel sounds. No guarding or rigidity  Extremities: Symmetrical in appearance without cyanosis or edema  Neurological: Alert awake oriented to time place and person.  Psychiatric: Appears normal.   Data Reviewed: Basic Metabolic Panel:  Recent Labs Lab 12/23/13 2019  NA 133*  K 4.6  CL 96  CO2 26  GLUCOSE 144*  BUN  26*  CREATININE 1.50*  CALCIUM 10.0   Liver Function Tests:  Recent Labs Lab 12/23/13 2019  AST 22  ALT 16  ALKPHOS 79  BILITOT 0.3   PROT 7.6  ALBUMIN 4.0   No results found for this basename: LIPASE, AMYLASE,  in the last 168 hours No results found for this basename: AMMONIA,  in the last 168 hours CBC:  Recent Labs Lab 12/23/13 2019  WBC 5.8  HGB 11.0*  HCT 32.3*  MCV 90.2  PLT 199   Cardiac Enzymes: No results found for this basename: CKTOTAL, CKMB, CKMBINDEX, TROPONINI,  in the last 168 hours BNP (last 3 results) No results found for this basename: PROBNP,  in the last 8760 hours CBG:  Recent Labs Lab 12/23/13 2040  GLUCAP 143*    No results found for this or any previous visit (from the past 240 hour(s)).   Studies: Ct Head Wo Contrast  12/23/2013   CLINICAL DATA:  Slurred speech  EXAM: CT HEAD WITHOUT CONTRAST  TECHNIQUE: Contiguous axial images were obtained from the base of the skull through the vertex without intravenous contrast.  COMPARISON:  None.  FINDINGS: There is no evidence of mass effect, midline shift, or extra-axial fluid collections. There is no evidence of a space-occupying lesion or intracranial hemorrhage. There is no evidence of a cortical-based area of acute infarction. There is generalized cerebral atrophy. There is periventricular white matter low attenuation likely secondary to microangiopathy.  The ventricles and sulci are appropriate for the patient's age. The basal cisterns are patent.  Visualized portions of the orbits are unremarkable. The visualized portions of the paranasal sinuses and mastoid air cells are unremarkable. Cerebrovascular atherosclerotic calcifications are noted.  The osseous structures are unremarkable.  IMPRESSION: No acute intracranial pathology.  These results were called by telephone at the time of interpretation on 12/23/2013 at 8:50 pm to Dr. Devoria Albe , who verbally acknowledged these results.   Electronically Signed   By: Elige Ko   On: 12/23/2013 20:50    Scheduled Meds: .  stroke: mapping our early stages of recovery book   Does not apply Once   . amLODipine  10 mg Oral Daily  . aspirin  300 mg Rectal Daily   Or  . aspirin  325 mg Oral Daily  . atorvastatin  10 mg Oral q1800  . cycloSPORINE  1 drop Both Eyes BID  . fluticasone  2 spray Each Nare Daily  . gabapentin  300 mg Oral TID  . hydrALAZINE  25 mg Oral BID  . Influenza vac split quadrivalent PF  0.5 mL Intramuscular Tomorrow-1000  . isosorbide mononitrate  30 mg Oral Daily  . l-methylfolate-B6-B12  1 tablet Oral Daily  . levETIRAcetam  250 mg Oral BID  . levothyroxine  175 mcg Oral QAC breakfast  . metoprolol succinate  50 mg Oral Daily  . pantoprazole  40 mg Oral Daily  . sertraline  100 mg Oral Daily  . sodium chloride  3 mL Intravenous Q12H  . sucralfate  1 g Oral TID WC   Continuous Infusions:   Principal Problem:   Slurred speech Active Problems:   Essential hypertension, benign   Seizures   Dyslipidemia   A-fib   Stroke    Time spent: 59    Illa Level [facility]  [facility] Pager [phone]. If 7PM-7AM, please contact night-coverage at [email or web address], password [facility] 12/24/2013, 11:52 AM  LOS: 1 day

## 2013-12-24 NOTE — Progress Notes (Signed)
PT Cancellation Note  Patient Details Name: Shannon [name] MRN: [id number] DOB: 05-Apr-1923   Cancelled Treatment:     PT order received. Pt is not in her room due to a medical procedure. Will attempt eval again later.   Lelon Mast 12/24/2013, 9:59 AM

## 2013-12-24 NOTE — Evaluation (Addendum)
Clinical/Bedside Swallow Evaluation Patient Details  Name: Breionna Chugh MRN: [id number] Date of Birth: 08-27-1923  Today's Date: 12/24/2013                     [id number]  Time: SLP Time Calculation (min): 9 min  Past Medical History:  Past Medical History  Diagnosis Date  . Pneumonia   . Thyroid disease   . Seizures   . Stroke   . Hyperlipidemia   . Hypertension   . Diabetes mellitus without complication   . Small bowel obstruction   . Ventral hernia with bowel obstruction    Past Surgical History:  Past Surgical History  Procedure Laterality Date  . Exploratory laparotomy  09-07-2013  . Cholecystectomy    . Tubal ligation    . Colectomy    . Hernia repair  2015   HPI:  80 yo female h/o cva, ?seizure disorder, HTN, pneumonia, CVA admitted for a episode of slurred speech earlier today and not acting right. MRI small chronic infarcts in both cerebellar hemispheres, an the bilateral thalami, no acute abnormality.  PA-C suspects Likely vasovagal response from severe neck pain.   Assessment / Plan / Recommendation Clinical Impression  Pt. demonstrated functional oral prep, mastication and transit with solid.  Pharyngeal phase within functional limits without s/s aspiration.  Pt. states she has difficulty masticating meats, therefore recommend Dys 3 diet texture and thin liquids, pills whole in applesauce.  No further ST needed.    Aspiration Risk  Mild    Diet Recommendation Dysphagia 3 (Mechanical Soft);Thin liquid   Liquid Administration via: Cup;Straw Medication Administration: Whole meds with liquid Supervision: Patient able to self feed Compensations: Slow rate;Small sips/bites Postural Changes and/or Swallow Maneuvers: Seated upright 90 degrees    Other  Recommendations Oral Care Recommendations: Oral care BID   Follow Up Recommendations  None    Frequency and Duration        Pertinent Vitals/Pain WDL         Swallow Study Prior Functional Status  Type  of Home: House Available Help at Discharge: Family       Oral/Motor/Sensory Function Overall Oral Motor/Sensory Function: Appears within functional limits for tasks assessed   Ice Chips Ice chips: Not tested   Thin Liquid Thin Liquid: Within functional limits Presentation: Cup;Straw    Nectar Thick Nectar Thick Liquid: Not tested   Honey Thick Honey Thick Liquid: Not tested   Puree Puree: Within functional limits   Solid   GO    Solid: Within functional limits       Houston Siren 12/24/2013,2:19 PM   Orbie Pyo [place].[employer] [phone]

## 2013-12-24 NOTE — Progress Notes (Signed)
-   I agree with data and plan as above. - MRI negative for stroke. most likely vasovagal syncope. - Carotid doppler pending. - Cont tylenol for neck pain.

## 2013-12-24 NOTE — Evaluation (Signed)
Physical Therapy Evaluation Patient Details Name: Shannon [name] MRN: [id number] DOB: 12-Jul-1923 Today's Date: 12/24/2013   History of Present Illness  80 yo female h/o cva, ?seizure disorder, htn, hld brought in by her daughter for a episode of slurred speech earlier today and not acting right.  She was sitting at the table when she suddenly became diaphoretic, weak and slurred speech.  911 was called.  Pt was brought in and code stroke was started.  MRI neg. for CVA  Clinical Impression  Pt presents with dependencies in mobility affecting her independence. Pt was unable to tolerate standing and gait due to immediately feeling diaphoretic and dizzy. Pt sat on EOB and BP checked 173/116 HR 129. MD entered the room and I communicated with him her vitals and inability to participate with gait at this time.  Pt returned to supine in bed. Recommend continued acute PT to focus on mobility and independence. Pt lives with her daughter who can provide 24 hour assistance as needed. Recommend home with HHPT.    Follow Up Recommendations Home health PT;Supervision/Assistance - 24 hour    Equipment Recommendations  None recommended by PT    Recommendations for Other Services OT consult     Precautions / Restrictions Precautions Precautions: Fall Precaution Comments: Pt very shakey Restrictions Weight Bearing Restrictions: No      Mobility  Bed Mobility Overal bed mobility: Needs Assistance Bed Mobility: Supine to Sit     Supine to sit: Supervision     General bed mobility comments: Pt very stiff and rigid, needed increased time and use of bed rails to sit up.  Transfers Overall transfer level: Needs assistance Equipment used: Rolling walker (2 wheeled) Transfers: Sit to/from Stand Sit to Stand: Min assist         General transfer comment: Pt verballized she was feeling dizzy, increaed sweating noted and general shakiness. Pt sat back down and BP checked 173/116  Ambulation/Gait                Stairs            Wheelchair Mobility    Modified Rankin (Stroke Patients Only)       Balance Overall balance assessment: Needs assistance Sitting-balance support: No upper extremity supported;Feet unsupported Sitting balance-Leahy Scale: Fair     Standing balance support: Bilateral upper extremity supported Standing balance-Leahy Scale: Fair                               Pertinent Vitals/Pain Pain Assessment: Faces Faces Pain Scale: Hurts little more Pain Location: bank, left shoulder Pain Descriptors / Indicators: Discomfort Pain Intervention(s): Limited activity within patient's tolerance    Home Living Family/patient expects to be discharged to:: Private residence Living Arrangements: Children Available Help at Discharge: Family Type of Home: House Home Access: Stairs to enter Entrance Stairs-Rails: None Entrance Stairs-Number of Steps: 1 Home Layout: One level Home Equipment: Environmental consultant - 2 wheels;Cane - single point      Prior Function Level of Independence: Independent with assistive device(s)               Hand Dominance        Extremity/Trunk Assessment   Upper Extremity Assessment: Defer to OT evaluation           Lower Extremity Assessment: Overall WFL for tasks assessed         Communication   Communication: Expressive difficulties  Cognition Arousal/Alertness: Awake/alert  Behavior During Therapy: WFL for tasks assessed/performed Overall Cognitive Status: Within Functional Limits for tasks assessed                      General Comments      Exercises        Assessment/Plan    PT Assessment Patient needs continued PT services  PT Diagnosis Difficulty walking;Generalized weakness   PT Problem List Decreased activity tolerance;Decreased balance;Decreased mobility;Decreased coordination;Decreased knowledge of use of DME  PT Treatment Interventions DME instruction;Gait training;Stair  training;Functional mobility training;Therapeutic activities;Therapeutic exercise;Balance training;Patient/family education   PT Goals (Current goals can be found in the Care Plan section) Acute Rehab PT Goals Patient Stated Goal: To get better. PT Goal Formulation: With patient Time For Goal Achievement: 01/07/14 Potential to Achieve Goals: Good    Frequency Min 3X/week   Barriers to discharge        Co-evaluation               End of Session Equipment Utilized During Treatment: Gait belt Activity Tolerance: Treatment limited secondary to medical complications (Comment) (increased BP and diaphoresis) Patient left: in bed;with call bell/phone within reach;with family/visitor present Nurse Communication: Mobility status         Time: 9147-8295 PT Time Calculation (min): 34 min   Charges:   PT Evaluation $Initial PT Evaluation Tier I: 1 Procedure PT Treatments $Therapeutic Activity: 8-22 mins   PT G Codes:          Greggory Stallion 12/24/2013, 12:19 PM

## 2013-12-24 NOTE — Care Management Note (Signed)
    Page 1 of 1   12/26/2013     3:45:05 PM CARE MANAGEMENT NOTE 12/26/2013  Patient:  Shannon [name], Shannon [name]   Account Number:  [id number]  Date Initiated:  12/24/2013  Documentation initiated by:  GRAVES-BIGELOW,Sharese Manrique  Subjective/Objective Assessment:   Pt admitted for slurred speech.     Action/Plan:   CM will offer choice for [facility] services once pt is medically stable for d/c.   Anticipated DC Date:  12/26/2013   Anticipated DC Plan:  [place]  CM consult      Choice offered to / List presented to:             Status of service:  Completed, signed off Medicare Important Message given?  YES (If response is "NO", the following Medicare IM given date fields will be blank) Date Medicare IM given:  12/24/2013 Medicare IM given by:  GRAVES-BIGELOW,Celester Morgan Date Additional Medicare IM given:   Additional Medicare IM given by:    Discharge Disposition:  HOME/SELF CARE  Per UR Regulation:  Reviewed for med. necessity/level of care/duration of stay  If discussed at [place] of Stay Meetings, dates discussed:    Comments:  12-26-13 [place], RN,BSN [phone] CM did speak with pt's daughter and she will not need any HH services at this time. Per daughter she is baselin for speech. No PT/OT needs.

## 2013-12-24 NOTE — Progress Notes (Signed)
STROKE TEAM PROGRESS NOTE   HISTORY Shannon [name] is a 80 y.o. female with a history of DM, stroke who presents with sudden onset slurred speech and difficulty walking this started around 5 PM 12/23/2013. She states that she noticed that she was feeling funny around this time, then sat down to eat dinner. During dinner, she did not speak much because she was concentrating on chewing. She then had a presyncopal episode, btu did not pass out completely. After this, it was noticed that she was slurring her speech. Her daughter therefore called 911. She also noted right neck pain of several weeks duration. Patient was not administered TPA secondary to delay in arrival. She was admitted for further evaluation and treatment.   SUBJECTIVE (INTERVAL HISTORY) Her daughter is at the bedside.  PT is in the room. Daughter reports she had just finished eating and she said she was not okay. She was diaphoretic and cold to touch. put her in the w/c. Became SOB and was sliding out of the chair - this was at 520. They got her to the floor where she stayed until EMS arrived. She had no weakness at that time. she had slurred speech following the episode. Slurred speech continues to day along with low volume. Overall she feels her condition is stable.    OBJECTIVE Temp:  [98 F (36.7 C)-98.2 F (36.8 C)] 98.2 F (36.8 C) (09/22 2330) Pulse Rate:  [54-62] 60 (09/23 0806) Cardiac Rhythm:  [-] Sinus bradycardia (09/23 0803) Resp:  [16-19] 18 (09/23 0628) BP: (124-175)/(54-85) 159/57 mmHg (09/23 0806) SpO2:  [96 %-100 %] 97 % (09/23 0628) Weight:  [68.493 kg (151 lb)] 68.493 kg (151 lb) (09/22 1950)   Recent Labs Lab 12/23/13 2040  GLUCAP 143*    Recent Labs Lab 12/23/13 2019  NA 133*  K 4.6  CL 96  CO2 26  GLUCOSE 144*  BUN 26*  CREATININE 1.50*  CALCIUM 10.0    Recent Labs Lab 12/23/13 2019  AST 22  ALT 16  ALKPHOS 79  BILITOT 0.3  PROT 7.6  ALBUMIN 4.0    Recent Labs Lab 12/23/13 2019   WBC 5.8  HGB 11.0*  HCT 32.3*  MCV 90.2  PLT 199   No results found for this basename: CKTOTAL, CKMB, CKMBINDEX, TROPONINI,  in the last 168 hours  Recent Labs  12/23/13 2019  LABPROT 13.8  INR 1.06   No results found for this basename: COLORURINE, APPERANCEUR, LABSPEC, [place], GLUCOSEU, HGBUR, BILIRUBINUR, KETONESUR, PROTEINUR, UROBILINOGEN, NITRITE, LEUKOCYTESUR,  in the last 72 hours     Component Value Date/Time   CHOL 110 12/24/2013 0456   TRIG 130 12/24/2013 0456   HDL 58 12/24/2013 0456   CHOLHDL 1.9 12/24/2013 0456   VLDL 26 12/24/2013 0456   LDLCALC 26 12/24/2013 0456   No results found for this basename: HGBA1C   No results found for this basename: labopia,  cocainscrnur,  labbenz,  amphetmu,  thcu,  labbarb    No results found for this basename: ETH,  in the last 168 hours  Ct Head Wo Contrast  12/23/2013   CLINICAL DATA:  Slurred speech  EXAM: CT HEAD WITHOUT CONTRAST  TECHNIQUE: Contiguous axial images were obtained from the base of the skull through the vertex without intravenous contrast.  COMPARISON:  None.  FINDINGS: There is no evidence of mass effect, midline shift, or extra-axial fluid collections. There is no evidence of a space-occupying lesion or intracranial hemorrhage. There is no evidence of a  cortical-based area of acute infarction. There is generalized cerebral atrophy. There is periventricular white matter low attenuation likely secondary to microangiopathy.  The ventricles and sulci are appropriate for the patient's age. The basal cisterns are patent.  Visualized portions of the orbits are unremarkable. The visualized portions of the paranasal sinuses and mastoid air cells are unremarkable. Cerebrovascular atherosclerotic calcifications are noted.  The osseous structures are unremarkable.  IMPRESSION: No acute intracranial pathology.  These results were called by telephone at the time of interpretation on 12/23/2013 at 8:50 pm to Dr. Rolland Porter , who verbally  acknowledged these results.   Electronically Signed   By: Kathreen Devoid   On: 12/23/2013 20:50    PHYSICAL EXAM : Frail elderly african american lady not in distress.Awake alert. Afebrile. Head is nontraumatic. Neck is supple without bruit. Hearing is diminished bilaterally. Cardiac exam no murmur or gallop. Lungs are clear to auscultation. Distal pulses are well felt. Neurological Exam : Awake alert oriented x2. Diminished attention, registration and recall. Follows simple and one and two-step commands. Mild dysarthria but can be understood with some difficulty. No aphasia, apraxia. Extraocular movements are full range without nystagmus. Blinks to threat bilaterally. Fundi were not visualized. Face is symmetric without weakness. Tongue is midline. Weak cough and gag. Motor system exam reveals no upper or lower extremity drift. Symmetric and equal antigravity strength in all 4 extremities. Not cooperative for detailed muscle testing. Touch and pinprick appear preserved bilaterally. Plantars are downgoing. Gait was not tested. ASSESSMENT/PLAN :  Shannon [name] is a 80 y.o. female with history of diabetes presenting with slurred speech and difficulty walking in setting of neck pain for several weeks) near syncopal event . I doubt this represents of stroke or TIA. She did not receive IV t-PA due to delay in arrival. Initial CT imaging negative.  TIA vs Syncope  Complete TIA workup in name of safety  no antithrombotics prior to admission, now on aspirin 325 mg orally /aspirin 300 mg suppository every day. Had been on plavix in the past. It was discontinued due to GIB in stomach d/t ulcer per patient. Would recommend aspirin 81 mg daily for secondary stroke prevention.  MRI  Negative for acute stroke  MRA head no large vessel stenosis  MRA neck without contrast no indication for dissection. No significant stenosis based on poor study with artifact  Carotid Doppler  pending   2D Echo  pending     SCDs for VTE prophylaxis  NPO failed RN swallow screen, for ST assessment  OOB with assistance  Resultant slurred & abnormal speech  Therapy recommendations:  pending   Ongoing aggressive risk factor management  Risk factor education  Patient counseled to be compliant with her antithrombotic medications  Disposition:  Anticipate home with family  Atrial Fibrillation  Not on anticoagulation given hx GIB/ulcer  Hypertension   Permissive hypertension <220/120 for 24-48 hours and then gradually normalize within 5-7 days  BP goal long term normotensive  Home meds:  Norvasc. Imdur, toprol, cozaar. Resumed in hospital  BP 124-172/57-85 past 24h (12/24/2013 @ 11:44 AM)  Stable  Hyperlipidemia  Home meds:  lipitor 10. Resumed in hospital  LDL 26   LDL goal < 100 (<70 for diabetics)  Diabetes  Home meds:  None listed  HgbA1c pending    Goal < 7.0  Other Stroke Risk Factors Advanced age   Hx stroke/TIA   Family hx stroke (mother)  Other Pertinent History  seizures on keppra  Hypothyroid on  synthroid  depression  Hospital day # 1  [facility] BIBY, MSN, RN, ANVP-BC, ANP-BC, [facility] Pager: [phone] 12/24/2013 11:54 AM   I have personally examined this patient, reviewed notes, independently viewed imaging studies, participated in medical decision making and plan of care. I have made any additions or clarifications directly to the above note. Agree with note above. I had a long discussion with the patient and daughter and counseled her to take aspirin for stroke prevention for atrial fibrillation. Discussed the risk benefit and risk for recurrent strokes and and answered their questions  Antony Contras, MD Medical Director [place] Pager: [phone] 12/24/2013 1:28 PM   To contact Stroke Continuity provider, please refer to [email or web address] After hours, contact General Neurology

## 2013-12-24 NOTE — Progress Notes (Signed)
SLP Cancellation Note  Patient Details Name: Shannon [name] MRN: [id number] DOB: 09-Sep-1923   Cancelled treatment:        Have received 2 calls from RN re: completion of swallow evaluation, and have made 3 attempts to see patient (in MRI, with PT for eval, now with MD).  SLP will reattempt later this afternoon.   Quinn Axe T 12/24/2013, 12:12 PM

## 2013-12-24 NOTE — Progress Notes (Signed)
OT Cancellation Note  Patient Details Name: Shannon [name] MRN: [id number] DOB: [birth year]   Cancelled Treatment:    Reason Eval/Treat Not Completed: Patient not medically ready (bedrest for 12 hours. Bedrest until 11am today)  [name]  Pager: [phone]    12/24/2013, 9:12 AM

## 2013-12-24 NOTE — Progress Notes (Signed)
  Echocardiogram 2D Echocardiogram has been performed.  Donata Clay 12/24/2013, 5:11 PM

## 2013-12-25 DIAGNOSIS — R55 Syncope and collapse: Secondary | ICD-10-CM | POA: Diagnosis present

## 2013-12-25 DIAGNOSIS — I6350 Cerebral infarction due to unspecified occlusion or stenosis of unspecified cerebral artery: Secondary | ICD-10-CM

## 2013-12-25 NOTE — Progress Notes (Signed)
Occupational Therapy Evaluation Patient Details Name: Shannon [name] MRN: [id number] DOB: 25-Jun-1923 Today's Date: 12/25/2013    History of Present Illness 81 yo female h/o cva, ?seizure disorder, htn, hld brought in by her daughter for a episode of slurred speech earlier today and not acting right.  She was sitting at the table when she suddenly became diaphoretic, weak and slurred speech.  911 was called.  Pt was brought in and code stroke was started.  MRI neg. for CVA   Clinical Impression   PTA, pt was mod I with ADL and mobility @ RW level and lived with daughter, who can provide 24/7 S. Pt close to baseline level with ADL. Pt is safe to D/C home with 24/7 S when medically stable. Discussed home safe and fall prevention with pt. Pt has nec DME.  OT signing off. Thank you.    Follow Up Recommendations  Supervision/Assistance - 24 hour (initially)    Equipment Recommendations  None recommended by OT    Recommendations for Other Services       Precautions / Restrictions Precautions Precautions: Fall      Mobility Bed Mobility Overal bed mobility: Modified Independent Bed Mobility: Supine to Sit              Transfers Overall transfer level: Needs assistance Equipment used: Rolling walker (2 wheeled) Transfers: Sit to/from [employer] Sit to Stand: Supervision Stand pivot transfers: Supervision            Balance Overall balance assessment: Needs assistance   Sitting balance-Leahy Scale: Good     Standing balance support: During functional activity;Bilateral upper extremity supported Standing balance-Leahy Scale: Fair                              ADL Overall ADL's : At baseline                                       General ADL Comments: Rec general S initially for ADL     Vision                     Perception     Praxis      Pertinent Vitals/Pain Pain Assessment: No/denies pain     Hand  Dominance Right   Extremity/Trunk Assessment Upper Extremity Assessment Upper Extremity Assessment: Overall WFL for tasks assessed   Lower Extremity Assessment Lower Extremity Assessment: Defer to PT evaluation   Cervical / Trunk Assessment Cervical / Trunk Assessment: Normal   Communication Communication Communication: No difficulties   Cognition Arousal/Alertness: Awake/alert Behavior During Therapy: WFL for tasks assessed/performed Overall Cognitive Status: Within Functional Limits for tasks assessed                     General Comments       Exercises       Shoulder Instructions      Home Living Family/patient expects to be discharged to:: Private residence Living Arrangements: Children Available Help at Discharge: Family Type of Home: House Home Access: Stairs to enter Secretary/administrator of Steps: 1 Entrance Stairs-Rails: None Home Layout: One level     Bathroom Shower/Tub: Chief Strategy Officer: Standard Bathroom Accessibility: Yes How Accessible: Accessible via walker Home Equipment: Walker - 2 wheels;Cane - single point;Shower seat;Bedside commode  Lives With: Daughter    Prior Functioning/Environment Level of Independence: Independent with assistive device(s)             OT Diagnosis:     OT Problem List:     OT Treatment/Interventions:      OT Goals(Current goals can be found in the care plan section) Acute Rehab OT Goals Patient Stated Goal: to go home  OT Frequency:     Barriers to D/C:            Co-evaluation              End of Session Equipment Utilized During Treatment: Gait belt;Rolling walker Nurse Communication: Mobility status;Other (comment) (BP)  Activity Tolerance: Patient tolerated treatment well Patient left: in chair;with call bell/phone within reach   Time: 1239-1300 OT Time Calculation (min): 21 min Charges:  OT General Charges $OT Visit: 1 Procedure OT  Evaluation $Initial OT Evaluation Tier I: 1 Procedure OT Treatments $Self Care/Home Management : 8-22 mins G-Codes:    Sweden Lesure,HILLARY 01/17/14, 1:07 PM [facility], OTR/L  [phone] 01/17/2014

## 2013-12-25 NOTE — Progress Notes (Signed)
[facility] PROGRESS NOTE  [name] JYN:[id number] DOB: 1923/07/30 DOA: 12/23/2013 PCP: Oneal Grout, MD  Assessment/Plan: #1 presyncope Questionable etiology. May be secondary to vasovagal versus cardiogenic versus neurological. MRI of the head negative for any acute abnormalities. Patient has been seen by neurology. 2-D echo with no source for patient's syncopal episode. Carotid Dopplers are pending. Continue aspirin. Continue Keppra. If carotid Dopplers are negative may need an event monitor and outpatient followup.  #2 neck pain Questionable etiology. MRA of the neck suspicious for ICA stenosis and high-grade stenosis of left ACA with poor flowing vessel. Carotid Doppler is pending.  #3 atrial fibrillation Currently rate controlled. Patient being on anticoagulation candidate. The patient on aspirin.  #4 history of CVA Patient was on Plavix which has subsequently been discontinued secondary to recent GI bleed. Continue aspirin for secondary stroke prevention. Neurology following.  #5 hypothyroidism Continue Synthroid.  #6 hyperlipidemia LDL of 26. Continue statin.  #7 hypertension Stable. Continue Norvasc, hydralazine, imdur, Toprol-XL.  #8 depression Stable. Continue Zoloft.  #9 type 2 diabetes Hemoglobin A1c 6.9. Outpatient followup.  #10 peripheral neuropathy Continue Neurontin and mental folate.  #11 history of perforated ulcer Patient recent GI bleed and perforated ulcer and Plavix was discontinued. Continue Sucrafate and PPI.  Code Status: Full Family Communication: Updated patient no family present. Disposition Plan: Home vs SNF when medically stable.   Consultants:  Neurology: Dr. Amada Jupiter 12/23/2013  Procedures:  2-D echo 12/24/2013  MRI MRA of the head 12/24/2013  Carotid Dopplers pending  CT head 12/23/2013  Antibiotics:  None  HPI/Subjective: Patient with no complaints. No further syncopal episodes.  Objective: Filed  Vitals:   12/25/13 0820  BP: 157/57  Pulse: 66  Temp: 99 F (37.2 C)  Resp: 17    Intake/Output Summary (Last 24 hours) at 12/25/13 1055 Last data filed at 12/25/13 0645  Gross per 24 hour  Intake    420 ml  Output      0 ml  Net    420 ml   Filed Weights   12/23/13 1950 12/25/13 0440  Weight: 68.493 kg (151 lb) 67.722 kg (149 lb 4.8 oz)    Exam:   General:  NAD  Cardiovascular: RRR  Respiratory: CTAB  Abdomen: Soft/NT/ND/+BS  Musculoskeletal: No c/c/e  Data Reviewed: Basic Metabolic Panel:  Recent Labs Lab 12/23/13 2019  NA 133*  K 4.6  CL 96  CO2 26  GLUCOSE 144*  BUN 26*  CREATININE 1.50*  CALCIUM 10.0   Liver Function Tests:  Recent Labs Lab 12/23/13 2019  AST 22  ALT 16  ALKPHOS 79  BILITOT 0.3  PROT 7.6  ALBUMIN 4.0   No results found for this basename: LIPASE, AMYLASE,  in the last 168 hours No results found for this basename: AMMONIA,  in the last 168 hours CBC:  Recent Labs Lab 12/23/13 2019  WBC 5.8  HGB 11.0*  HCT 32.3*  MCV 90.2  PLT 199   Cardiac Enzymes: No results found for this basename: CKTOTAL, CKMB, CKMBINDEX, TROPONINI,  in the last 168 hours BNP (last 3 results) No results found for this basename: PROBNP,  in the last 8760 hours CBG:  Recent Labs Lab 12/23/13 2040  GLUCAP 143*    No results found for this or any previous visit (from the past 240 hour(s)).   Studies: Ct Head Wo Contrast  12/23/2013   CLINICAL DATA:  Slurred speech  EXAM: CT HEAD WITHOUT CONTRAST  TECHNIQUE: Contiguous axial images were obtained from  the base of the skull through the vertex without intravenous contrast.  COMPARISON:  None.  FINDINGS: There is no evidence of mass effect, midline shift, or extra-axial fluid collections. There is no evidence of a space-occupying lesion or intracranial hemorrhage. There is no evidence of a cortical-based area of acute infarction. There is generalized cerebral atrophy. There is periventricular  white matter low attenuation likely secondary to microangiopathy.  The ventricles and sulci are appropriate for the patient's age. The basal cisterns are patent.  Visualized portions of the orbits are unremarkable. The visualized portions of the paranasal sinuses and mastoid air cells are unremarkable. Cerebrovascular atherosclerotic calcifications are noted.  The osseous structures are unremarkable.  IMPRESSION: No acute intracranial pathology.  These results were called by telephone at the time of interpretation on 12/23/2013 at 8:50 pm to Dr. Devoria Albe , who verbally acknowledged these results.   Electronically Signed   By: Elige Ko   On: 12/23/2013 20:50   [name] Head Wo Contrast  12/24/2013   CLINICAL DATA:  80 year old female with episode of slurred speech, weakness and diaphoresis. Code stroke. Query seizure. Initial encounter.  EXAM: MRI HEAD WITHOUT CONTRAST  MRA HEAD WITHOUT CONTRAST  MRA NECK WITHOUT CONTRAST  TECHNIQUE: Multiplanar, multiecho pulse sequences of the brain and surrounding structures were obtained without intravenous contrast. Angiographic images of the Circle of Willis were obtained using MRA technique without intravenous contrast. Angiographic images of the neck were obtained using MRA technique without intravenous contrast. Carotid stenosis measurements (when applicable) are obtained utilizing NASCET criteria, using the distal internal carotid diameter as the denominator.  COMPARISON:  Head CT without contrast 12/23/2013 at 2032 hrs.  FINDINGS: MRI HEAD FINDINGS  Study is intermittently degraded by motion artifact despite repeated imaging attempts.  No restricted diffusion to suggest acute infarction. No midline shift, mass effect, evidence of mass lesion, ventriculomegaly, extra-axial collection or acute intracranial hemorrhage. Cervicomedullary junction and pituitary are within normal limits. Major intracranial vascular flow voids are preserved.  Small chronic infarcts in both  cerebellar hemispheres, an the bilateral thalami. Patchy cerebral white matter T2 and FLAIR hyperintensity, with some white matter signal changes most resembling chronic lacunar infarcts. Occasional chronic micro hemorrhages in the brain.  Visible internal auditory structures appear normal. Visualized paranasal sinuses and mastoids are clear. Postoperative changes to the globes. Visualized scalp soft tissues are within normal limits. Normal bone marrow signal.  Heterogeneity of the C3-C4 endplates in the visible upper cervical spine is felt to be degenerative in nature.  MRA HEAD FINDINGS  Antegrade flow in the posterior circulation. Codominant distal vertebral arteries. Normal PICA and AICA origins. Patent vertebrobasilar junction. No basilar stenosis. SCA and PCA origins are within normal limits. Tortuous right posterior communicating artery, the left is diminutive or absent. Bilateral PCA branches are within normal limits.  Antegrade flow in both ICA siphons. Tortuous distal cervical right ICA. Siphon irregularity in keeping with atherosclerosis, but no siphon stenosis. Ophthalmic and right posterior communicating artery origins are within normal limits. Patent carotid termini. Normal MCA and ACA origins. Anterior communicating artery within normal limits.  High-grade tandem left ACA A2 segment and pericholecystic all region stenosis, with attenuated left ACA flow. See series 8, image 46. Visualized right ACA branches are within normal limits.  Bilateral MCA M1 segments are within normal limits. Both MCA bifurcations are patent. Visualized bilateral MCA branches are within normal limits.  MRA NECK FINDINGS  Time-of-flight imaging demonstrates antegrade flow in both carotid and vertebral arteries in the  neck. There is irregularity and loss of flow signal at both carotid bifurcations, greater on the right. The appearance of the right ICA origin is suspicious for hemodynamically significant stenosis (series 16,  image 35). Tortuous distal cervical right ICA.  IMPRESSION: 1. No acute intracranial abnormality. Moderately advanced chronic small vessel ischemia. 2. Carotid atherosclerosis and tortuosity in the neck, greater on the right. The appearance is suspicious for right ICA origin hemodynamically significant stenosis. Carotid doppler may be the simplest way to confirm. 3. 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 4. Otherwise negative for age intracranial MRA.   Electronically Signed   By: Augusto Gamble M.D.   On: 12/24/2013 11:50   Mr Angiogram Neck Wo Contrast  12/24/2013   CLINICAL DATA:  80 year old female with episode of slurred speech, weakness and diaphoresis. Code stroke. Query seizure. Initial encounter.  EXAM: MRI HEAD WITHOUT CONTRAST  MRA HEAD WITHOUT CONTRAST  MRA NECK WITHOUT CONTRAST  TECHNIQUE: Multiplanar, multiecho pulse sequences of the brain and surrounding structures were obtained without intravenous contrast. Angiographic images of the Circle of Willis were obtained using MRA technique without intravenous contrast. Angiographic images of the neck were obtained using MRA technique without intravenous contrast. Carotid stenosis measurements (when applicable) are obtained utilizing NASCET criteria, using the distal internal carotid diameter as the denominator.  COMPARISON:  Head CT without contrast 12/23/2013 at 2032 hrs.  FINDINGS: MRI HEAD FINDINGS  Study is intermittently degraded by motion artifact despite repeated imaging attempts.  No restricted diffusion to suggest acute infarction. No midline shift, mass effect, evidence of mass lesion, ventriculomegaly, extra-axial collection or acute intracranial hemorrhage. Cervicomedullary junction and pituitary are within normal limits. Major intracranial vascular flow voids are preserved.  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 Patchy cerebral white matter T2 and FLAIR  hyperintensity, with some white matter signal changes most resembling chronic lacunar infarcts. Occasional chronic micro hemorrhages in the brain.  Visible internal auditory structures appear normal. Visualized paranasal sinuses and mastoids are clear. Postoperative changes to the globes. Visualized scalp soft tissues are within normal limits. Normal bone marrow signal.  Heterogeneity of the C3-C4 endplates in the visible upper cervical spine is felt to be degenerative in nature.  MRA HEAD FINDINGS  Antegrade flow in the posterior circulation. Codominant distal vertebral arteries. Normal PICA and AICA origins. Patent vertebrobasilar junction. No basilar stenosis. SCA and PCA origins are within normal limits. Tortuous right posterior communicating artery, the left is diminutive or absent. Bilateral PCA branches are within normal limits.  Antegrade flow in both ICA siphons. Tortuous distal cervical right ICA. Siphon irregularity in keeping with atherosclerosis, but no siphon stenosis. Ophthalmic and right posterior communicating artery origins are within normal limits. Patent carotid termini. Normal MCA and ACA origins. Anterior communicating artery within normal limits.  High-grade tandem left ACA A2 segment and pericholecystic all region stenosis, with attenuated left ACA flow. See series 8, image 46. Visualized right ACA branches are within normal limits.  Bilateral MCA M1 segments are within normal limits. Both MCA bifurcations are patent. Visualized bilateral MCA branches are within normal limits.  MRA NECK FINDINGS  Time-of-flight imaging demonstrates antegrade flow in both carotid and vertebral arteries in the neck. There is irregularity and loss of flow signal at both carotid bifurcations, greater on the right. The appearance of the right ICA origin is suspicious for hemodynamically significant stenosis (series 16, image 35). Tortuous distal cervical right ICA.  IMPRESSION: 1.  No acute intracranial  abnormality. Moderately advanced chronic small vessel ischemia. 2. Carotid atherosclerosis and tortuosity in the neck, greater on the right. The appearance is suspicious for right ICA origin hemodynamically significant stenosis. Carotid doppler may be the simplest way to confirm. 3. 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 4. Otherwise negative for age intracranial MRA.   Electronically Signed   By: Augusto Gamble M.D.   On: 12/24/2013 11:50   Mr Brain Wo Contrast  12/24/2013   CLINICAL DATA:  80 year old female with episode of slurred speech, weakness and diaphoresis. Code stroke. Query seizure. Initial encounter.  EXAM: MRI HEAD WITHOUT CONTRAST  MRA HEAD WITHOUT CONTRAST  MRA NECK WITHOUT CONTRAST  TECHNIQUE: Multiplanar, multiecho pulse sequences of the brain and surrounding structures were obtained without intravenous contrast. Angiographic images of the Circle of Willis were obtained using MRA technique without intravenous contrast. Angiographic images of the neck were obtained using MRA technique without intravenous contrast. Carotid stenosis measurements (when applicable) are obtained utilizing NASCET criteria, using the distal internal carotid diameter as the denominator.  COMPARISON:  Head CT without contrast 12/23/2013 at 2032 hrs.  FINDINGS: MRI HEAD FINDINGS  Study is intermittently degraded by motion artifact despite repeated imaging attempts.  No restricted diffusion to suggest acute infarction. No midline shift, mass effect, evidence of mass lesion, ventriculomegaly, extra-axial collection or acute intracranial hemorrhage. Cervicomedullary junction and pituitary are within normal limits. Major intracranial vascular flow voids are preserved.  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 Patchy cerebral white matter T2 and FLAIR hyperintensity, with some white matter signal changes most resembling chronic lacunar infarcts.  Occasional chronic micro hemorrhages in the brain.  Visible internal auditory structures appear normal. Visualized paranasal sinuses and mastoids are clear. Postoperative changes to the globes. Visualized scalp soft tissues are within normal limits. Normal bone marrow signal.  Heterogeneity of the C3-C4 endplates in the visible upper cervical spine is felt to be degenerative in nature.  MRA HEAD FINDINGS  Antegrade flow in the posterior circulation. Codominant distal vertebral arteries. Normal PICA and AICA origins. Patent vertebrobasilar junction. No basilar stenosis. SCA and PCA origins are within normal limits. Tortuous right posterior communicating artery, the left is diminutive or absent. Bilateral PCA branches are within normal limits.  Antegrade flow in both ICA siphons. Tortuous distal cervical right ICA. Siphon irregularity in keeping with atherosclerosis, but no siphon stenosis. Ophthalmic and right posterior communicating artery origins are within normal limits. Patent carotid termini. Normal MCA and ACA origins. Anterior communicating artery within normal limits.  High-grade tandem left ACA A2 segment and pericholecystic all region stenosis, with attenuated left ACA flow. See series 8, image 46. Visualized right ACA branches are within normal limits.  Bilateral MCA M1 segments are within normal limits. Both MCA bifurcations are patent. Visualized bilateral MCA branches are within normal limits.  MRA NECK FINDINGS  Time-of-flight imaging demonstrates antegrade flow in both carotid and vertebral arteries in the neck. There is irregularity and loss of flow signal at both carotid bifurcations, greater on the right. The appearance of the right ICA origin is suspicious for hemodynamically significant stenosis (series 16, image 35). Tortuous distal cervical right ICA.  IMPRESSION: 1. No acute intracranial abnormality. Moderately advanced chronic small vessel ischemia. 2. Carotid atherosclerosis and tortuosity  in the neck, greater on the right. The appearance is suspicious for right ICA origin hemodynamically significant stenosis. Carotid doppler may be the simplest way to confirm.  3. 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 4. Otherwise negative for age intracranial MRA.   Electronically Signed   By: Augusto Gamble M.D.   On: 12/24/2013 11:50    Scheduled Meds: .  stroke: mapping our early stages of recovery book   Does not apply Once  . amLODipine  10 mg Oral Daily  . aspirin  300 mg Rectal Daily   Or  . aspirin  325 mg Oral Daily  . atorvastatin  10 mg Oral q1800  . cycloSPORINE  1 drop Both Eyes BID  . fluticasone  2 spray Each Nare Daily  . gabapentin  300 mg Oral TID  . hydrALAZINE  25 mg Oral BID  . isosorbide mononitrate  30 mg Oral Daily  . l-methylfolate-B6-B12  1 tablet Oral Daily  . levETIRAcetam  250 mg Oral BID  . levothyroxine  175 mcg Oral QAC breakfast  . metoprolol succinate  50 mg Oral Daily  . pantoprazole  40 mg Oral Daily  . sertraline  100 mg Oral Daily  . sodium chloride  3 mL Intravenous Q12H  . sucralfate  1 g Oral TID WC   Continuous Infusions:   Principal Problem:   Pre-syncope Active Problems:   Essential hypertension, benign   Seizures   GERD (gastroesophageal reflux disease)   Dyslipidemia   A-fib   Unspecified hypothyroidism   Slurred speech   Stroke    Time spent: 30 mins    [facility] MD [facility] Pager [phone]. If 7PM-7AM, please contact night-coverage at [email or web address], password [facility] 12/25/2013, 10:55 AM  LOS: 2 days

## 2013-12-25 NOTE — Progress Notes (Signed)
STROKE TEAM PROGRESS NOTE   HISTORY Shannon [name] is a 80 y.o. female with a history of DM, stroke who presents with sudden onset slurred speech and difficulty walking this started around 5 PM 12/23/2013. She states that she noticed that she was feeling funny around this time, then sat down to eat dinner. During dinner, she did not speak much because she was concentrating on chewing. She then had a presyncopal episode, btu did not pass out completely. After this, it was noticed that she was slurring her speech. Her daughter therefore called 911. She also noted right neck pain of several weeks duration. Patient was not administered TPA secondary to delay in arrival. She was admitted for further evaluation and treatment.   SUBJECTIVE (INTERVAL HISTORY) Her family is not at the bedside.    Overall she feels her condition is stable. Her speech is much clearer today and she has been able to ambulate   OBJECTIVE Temp:  [97.8 F (36.6 C)-99 F (37.2 C)] 97.8 F (36.6 C) (09/24 2218) Pulse Rate:  [62-71] 63 (09/24 2218) Cardiac Rhythm:  [-]  Resp:  [16-20] 20 (09/24 2218) BP: (133-157)/(57-70) 144/58 mmHg (09/24 2218) SpO2:  [96 %-100 %] 96 % (09/24 2218) Weight:  [149 lb 4.8 oz (67.722 kg)] 149 lb 4.8 oz (67.722 kg) (09/24 0440)   Recent Labs Lab 12/23/13 2040  GLUCAP 143*    Recent Labs Lab 12/23/13 2019  NA 133*  K 4.6  CL 96  CO2 26  GLUCOSE 144*  BUN 26*  CREATININE 1.50*  CALCIUM 10.0    Recent Labs Lab 12/23/13 2019  AST 22  ALT 16  ALKPHOS 79  BILITOT 0.3  PROT 7.6  ALBUMIN 4.0    Recent Labs Lab 12/23/13 2019  WBC 5.8  HGB 11.0*  HCT 32.3*  MCV 90.2  PLT 199   No results found for this basename: CKTOTAL, CKMB, CKMBINDEX, TROPONINI,  in the last 168 hours  Recent Labs  12/23/13 2019  LABPROT 13.8  INR 1.06   No results found for this basename: COLORURINE, APPERANCEUR, LABSPEC, PHURINE, GLUCOSEU, HGBUR, BILIRUBINUR, KETONESUR, PROTEINUR, UROBILINOGEN,  NITRITE, LEUKOCYTESUR,  in the last 72 hours     Component Value Date/Time   CHOL 110 12/24/2013 0456   TRIG 130 12/24/2013 0456   HDL 58 12/24/2013 0456   CHOLHDL 1.9 12/24/2013 0456   VLDL 26 12/24/2013 0456   LDLCALC 26 12/24/2013 0456   Lab Results  Component Value Date   HGBA1C 6.9* 12/24/2013   No results found for this basename: labopia,  cocainscrnur,  labbenz,  amphetmu,  thcu,  labbarb    No results found for this basename: ETH,  in the last 168 hours  Mr Shannon [name] Wo Contrast  12/24/2013   CLINICAL DATA:  80 year old female with episode of slurred speech, weakness and diaphoresis. Code stroke. Query seizure. Initial encounter.  EXAM: MRI HEAD WITHOUT CONTRAST  MRA HEAD WITHOUT CONTRAST  MRA NECK WITHOUT CONTRAST  TECHNIQUE: Multiplanar, multiecho pulse sequences of the brain and surrounding structures were obtained without intravenous contrast. Angiographic images of the Circle of Willis were obtained using MRA technique without intravenous contrast. Angiographic images of the neck were obtained using MRA technique without intravenous contrast. Carotid stenosis measurements (when applicable) are obtained utilizing NASCET criteria, using the distal internal carotid diameter as the denominator.  COMPARISON:  Head CT without contrast 12/23/2013 at 2032 hrs.  FINDINGS: MRI HEAD FINDINGS  Study is intermittently degraded by motion artifact despite repeated  imaging attempts.  No restricted diffusion to suggest acute infarction. No midline shift, mass effect, evidence of mass lesion, ventriculomegaly, extra-axial collection or acute intracranial hemorrhage. Cervicomedullary junction and pituitary are within normal limits. Major intracranial vascular flow voids are preserved.  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 Patchy cerebral white matter T2 and FLAIR hyperintensity, with some white matter signal changes most resembling chronic lacunar infarcts. Occasional  chronic micro hemorrhages in the brain.  Visible internal auditory structures appear normal. Visualized paranasal sinuses and mastoids are clear. Postoperative changes to the globes. Visualized scalp soft tissues are within normal limits. Normal bone marrow signal.  Heterogeneity of the C3-C4 endplates in the visible upper cervical spine is felt to be degenerative in nature.  MRA HEAD FINDINGS  Antegrade flow in the posterior circulation. Codominant distal vertebral arteries. Normal PICA and AICA origins. Patent vertebrobasilar junction. No basilar stenosis. SCA and PCA origins are within normal limits. Tortuous right posterior communicating artery, the left is diminutive or absent. Bilateral PCA branches are within normal limits.  Antegrade flow in both ICA siphons. Tortuous distal cervical right ICA. Siphon irregularity in keeping with atherosclerosis, but no siphon stenosis. Ophthalmic and right posterior communicating artery origins are within normal limits. Patent carotid termini. Normal MCA and ACA origins. Anterior communicating artery within normal limits.  High-grade tandem left ACA A2 segment and pericholecystic all region stenosis, with attenuated left ACA flow. See series 8, image 46. Visualized right ACA branches are within normal limits.  Bilateral MCA M1 segments are within normal limits. Both MCA bifurcations are patent. Visualized bilateral MCA branches are within normal limits.  MRA NECK FINDINGS  Time-of-flight imaging demonstrates antegrade flow in both carotid and vertebral arteries in the neck. There is irregularity and loss of flow signal at both carotid bifurcations, greater on the right. The appearance of the right ICA origin is suspicious for hemodynamically significant stenosis (series 16, image 35). Tortuous distal cervical right ICA.  IMPRESSION: 1. No acute intracranial abnormality. Moderately advanced chronic small vessel ischemia. 2. Carotid atherosclerosis and tortuosity in the neck,  greater on the right. The appearance is suspicious for right ICA origin hemodynamically significant stenosis. Carotid doppler may be the simplest way to confirm. 3. 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 4. Otherwise negative for age intracranial MRA.   Electronically Signed   By: Lars Pinks M.D.   On: 12/24/2013 11:50   Mr Angiogram Neck Wo Contrast  12/24/2013   CLINICAL DATA:  80 year old female with episode of slurred speech, weakness and diaphoresis. Code stroke. Query seizure. Initial encounter.  EXAM: MRI HEAD WITHOUT CONTRAST  MRA HEAD WITHOUT CONTRAST  MRA NECK WITHOUT CONTRAST  TECHNIQUE: Multiplanar, multiecho pulse sequences of the brain and surrounding structures were obtained without intravenous contrast. Angiographic images of the Circle of Willis were obtained using MRA technique without intravenous contrast. Angiographic images of the neck were obtained using MRA technique without intravenous contrast. Carotid stenosis measurements (when applicable) are obtained utilizing NASCET criteria, using the distal internal carotid diameter as the denominator.  COMPARISON:  Head CT without contrast 12/23/2013 at 2032 hrs.  FINDINGS: MRI HEAD FINDINGS  Study is intermittently degraded by motion artifact despite repeated imaging attempts.  No restricted diffusion to suggest acute infarction. No midline shift, mass effect, evidence of mass lesion, ventriculomegaly, extra-axial collection or acute intracranial hemorrhage. Cervicomedullary junction and pituitary are within normal limits. Major intracranial vascular flow voids are preserved.  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 Patchy cerebral white matter T2 and FLAIR hyperintensity, with some white matter signal changes most resembling chronic lacunar infarcts. Occasional chronic micro hemorrhages in the brain.  Visible internal auditory structures appear normal.  Visualized paranasal sinuses and mastoids are clear. Postoperative changes to the globes. Visualized scalp soft tissues are within normal limits. Normal bone marrow signal.  Heterogeneity of the C3-C4 endplates in the visible upper cervical spine is felt to be degenerative in nature.  MRA HEAD FINDINGS  Antegrade flow in the posterior circulation. Codominant distal vertebral arteries. Normal PICA and AICA origins. Patent vertebrobasilar junction. No basilar stenosis. SCA and PCA origins are within normal limits. Tortuous right posterior communicating artery, the left is diminutive or absent. Bilateral PCA branches are within normal limits.  Antegrade flow in both ICA siphons. Tortuous distal cervical right ICA. Siphon irregularity in keeping with atherosclerosis, but no siphon stenosis. Ophthalmic and right posterior communicating artery origins are within normal limits. Patent carotid termini. Normal MCA and ACA origins. Anterior communicating artery within normal limits.  High-grade tandem left ACA A2 segment and pericholecystic all region stenosis, with attenuated left ACA flow. See series 8, image 46. Visualized right ACA branches are within normal limits.  Bilateral MCA M1 segments are within normal limits. Both MCA bifurcations are patent. Visualized bilateral MCA branches are within normal limits.  MRA NECK FINDINGS  Time-of-flight imaging demonstrates antegrade flow in both carotid and vertebral arteries in the neck. There is irregularity and loss of flow signal at both carotid bifurcations, greater on the right. The appearance of the right ICA origin is suspicious for hemodynamically significant stenosis (series 16, image 35). Tortuous distal cervical right ICA.  IMPRESSION: 1. No acute intracranial abnormality. Moderately advanced chronic small vessel ischemia. 2. Carotid atherosclerosis and tortuosity in the neck, greater on the right. The appearance is suspicious for right ICA origin hemodynamically  significant stenosis. Carotid doppler may be the simplest way to confirm. 3. 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 4. Otherwise negative for age intracranial MRA.   Electronically Signed   By: Lars Pinks M.D.   On: 12/24/2013 11:50   Mr Brain Wo Contrast  12/24/2013   CLINICAL DATA:  80 year old female with episode of slurred speech, weakness and diaphoresis. Code stroke. Query seizure. Initial encounter.  EXAM: MRI HEAD WITHOUT CONTRAST  MRA HEAD WITHOUT CONTRAST  MRA NECK WITHOUT CONTRAST  TECHNIQUE: Multiplanar, multiecho pulse sequences of the brain and surrounding structures were obtained without intravenous contrast. Angiographic images of the Circle of Willis were obtained using MRA technique without intravenous contrast. Angiographic images of the neck were obtained using MRA technique without intravenous contrast. Carotid stenosis measurements (when applicable) are obtained utilizing NASCET criteria, using the distal internal carotid diameter as the denominator.  COMPARISON:  Head CT without contrast 12/23/2013 at 2032 hrs.  FINDINGS: MRI HEAD FINDINGS  Study is intermittently degraded by motion artifact despite repeated imaging attempts.  No restricted diffusion to suggest acute infarction. No midline shift, mass effect, evidence of mass lesion, ventriculomegaly, extra-axial collection or acute intracranial hemorrhage. Cervicomedullary junction and pituitary are within normal limits. Major intracranial vascular flow voids are preserved.  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 Patchy cerebral white matter T2 and FLAIR hyperintensity, with some white matter signal changes most resembling chronic lacunar infarcts. Occasional chronic micro hemorrhages in the brain.  Visible internal auditory structures appear  normal. Visualized paranasal sinuses and mastoids are clear. Postoperative changes to the globes. Visualized  scalp soft tissues are within normal limits. Normal bone marrow signal.  Heterogeneity of the C3-C4 endplates in the visible upper cervical spine is felt to be degenerative in nature.  MRA HEAD FINDINGS  Antegrade flow in the posterior circulation. Codominant distal vertebral arteries. Normal PICA and AICA origins. Patent vertebrobasilar junction. No basilar stenosis. SCA and PCA origins are within normal limits. Tortuous right posterior communicating artery, the left is diminutive or absent. Bilateral PCA branches are within normal limits.  Antegrade flow in both ICA siphons. Tortuous distal cervical right ICA. Siphon irregularity in keeping with atherosclerosis, but no siphon stenosis. Ophthalmic and right posterior communicating artery origins are within normal limits. Patent carotid termini. Normal MCA and ACA origins. Anterior communicating artery within normal limits.  High-grade tandem left ACA A2 segment and pericholecystic all region stenosis, with attenuated left ACA flow. See series 8, image 46. Visualized right ACA branches are within normal limits.  Bilateral MCA M1 segments are within normal limits. Both MCA bifurcations are patent. Visualized bilateral MCA branches are within normal limits.  MRA NECK FINDINGS  Time-of-flight imaging demonstrates antegrade flow in both carotid and vertebral arteries in the neck. There is irregularity and loss of flow signal at both carotid bifurcations, greater on the right. The appearance of the right ICA origin is suspicious for hemodynamically significant stenosis (series 16, image 35). Tortuous distal cervical right ICA.  IMPRESSION: 1. No acute intracranial abnormality. Moderately advanced chronic small vessel ischemia. 2. Carotid atherosclerosis and tortuosity in the neck, greater on the right. The appearance is suspicious for right ICA origin hemodynamically significant stenosis. Carotid doppler may be the simplest way to confirm. 3. Tandem high-grade stenoses  of the left ACA, with subsequent poor flow in that vessel. 4. Otherwise negative for age intracranial MRA.   Electronically Signed   By: Lars Pinks M.D.   On: 12/24/2013 11:50    PHYSICAL EXAM : Frail elderly african american lady not in distress.Awake alert. Afebrile. Head is nontraumatic. Neck is supple without bruit. Hearing is diminished bilaterally. Cardiac exam no murmur or gallop. Lungs are clear to auscultation. Distal pulses are well felt. Neurological Exam : Awake alert oriented x2. Diminished attention, registration and recall. Follows simple and one and two-step commands. Speech is fairly normal. No aphasia, apraxia. Extraocular movements are full range without nystagmus. Blinks to threat bilaterally. Fundi were not visualized. Face is symmetric without weakness. Tongue is midline. Weak cough and gag. Motor system exam reveals no upper or lower extremity drift. Symmetric and equal antigravity strength in all 4 extremities. Not cooperative for detailed muscle testing. Touch and pinprick appear preserved bilaterally. Plantars are downgoing. Gait was not tested. ASSESSMENT/PLAN :  Shannon [name] is a 80 y.o. female with history of diabetes presenting with slurred speech and difficulty walking in setting of neck pain for several weeks) near syncopal event . I doubt this represents of stroke or TIA. She did not receive IV t-PA due to delay in arrival. Initial CT imaging negative.  TIA vs Syncope  Complete TIA workup in name of safety  no antithrombotics prior to admission, now on aspirin 325 mg orally /aspirin 300 mg suppository every day. Had been on plavix in the past. It was discontinued due to GIB in stomach d/t ulcer per patient. Would recommend aspirin 81 mg daily for secondary stroke prevention.  MRI  Negative for acute stroke  MRA head no large  vessel stenosis  MRA neck without contrast no indication for dissection. No significant stenosis based on poor study with  artifact  Carotid Doppler  pending  2D Echo   Left ventricle: The cavity size was mildly dilated. Wall thickness was increased in a pattern of mild LVH. Systolic function was normal. The estimated ejection fraction was in the range of 55% to 60%.     SCDs for VTE prophylaxis  Dysphagia failed RN swallow screen, for ST assessment  OOB with assistance  Resultant slurred & abnormal speech  Therapy recommendations:  pending   Ongoing aggressive risk factor management  Risk factor education  Patient counseled to be compliant with her antithrombotic medications  Disposition:  Anticipate home with family  Atrial Fibrillation  Not on anticoagulation given hx GIB/ulcer  Hypertension   Permissive hypertension <220/120 for 24-48 hours and then gradually normalize within 5-7 days  BP goal long term normotensive  Home meds:  Norvasc. Imdur, toprol, cozaar. Resumed in hospital  BP 124-172/57-85 past 24h (12/25/2013 @ 10:50 PM)  Stable  Hyperlipidemia  Home meds:  lipitor 10. Resumed in hospital  LDL 26   LDL goal < 100 (<70 for diabetics)  Diabetes  Home meds:  None listed  HgbA1c pending    Goal < 7.0  Other Stroke Risk Factors Advanced age   Hx stroke/TIA   Family hx stroke (mother)  Other Pertinent History  seizures on keppra  Hypothyroid on synthroid  depression  Hospital day # 2  SHARON BIBY, MSN, RN, ANVP-BC, ANP-BC, [facility] Pager: [phone] 12/25/2013 10:50 PM   I have personally examined this patient, reviewed notes, independently viewed imaging studies, participated in medical decision making and plan of care. I have made any additions or clarifications directly to the above note.   Antony Contras, MD Medical Director [facility] Pager: [phone] 12/25/2013 10:50 PM   To contact Stroke Continuity provider, please refer to [email or web address] After hours, contact General Neurology

## 2013-12-25 NOTE — Evaluation (Signed)
Speech Language Pathology Evaluation Patient Details Name: Shannon [name] MRN: [id number] DOB: 1923-10-31 Today's Date: 12/25/2013 Time: 5284-1324 SLP Time Calculation (min): 20 min  Problem List:  Patient Active Problem List   Diagnosis Date Noted  . Pre-syncope 12/25/2013  . Slurred speech 12/23/2013  . Stroke 12/23/2013  . Hypomagnesemia 10/06/2013  . Edema 10/06/2013  . Depression 09/29/2013  . CAP (community acquired pneumonia) 09/29/2013  . Acute blood loss anemia 09/29/2013  . Generalized weakness 09/29/2013  . Perforated ulcer 09/29/2013  . Essential hypertension, benign 09/22/2013  . Seizures 09/22/2013  . GERD (gastroesophageal reflux disease) 09/22/2013  . Dyslipidemia 09/22/2013  . Type II or unspecified type diabetes mellitus with peripheral circulatory disorders, uncontrolled(250.72) 09/22/2013  . A-fib 09/22/2013  . CVA (cerebral vascular accident) 09/22/2013  . Diabetic neuropathy 09/22/2013  . Unspecified hypothyroidism 09/22/2013   Past Medical History:  Past Medical History  Diagnosis Date  . Pneumonia   . Thyroid disease   . Seizures   . Stroke   . Hyperlipidemia   . Hypertension   . Diabetes mellitus without complication   . Small bowel obstruction   . Ventral hernia with bowel obstruction    Past Surgical History:  Past Surgical History  Procedure Laterality Date  . Exploratory laparotomy  09-07-2013  . Cholecystectomy    . Tubal ligation    . Colectomy    . Hernia repair  2015   HPI:      Assessment / Plan / Recommendation Clinical Impression  Pt presents with baseline mild dysarthria which she states has been present for approximately one year - since she has taken Depakote.  Pt is intelligible however and communication is effective.  Pt reports Depakote was tapered and symptoms improved but did not resolve.   Pt stated she will have her daughter help to monitor medication administration as recent medications have changed.  SLP reinforced  the need for monitoring for safety reasons.  Pt educated to also call for assistance due to fall risk as PT note indicated pt was "shaky."  Pt benefited from reinforcement for maximal safety.  As all education is completed, pt has support needed at home and speech is at its baseline x1 year, no SLP warranted.     SLP Assessment  Patient does not need any further Speech Lanaguage Pathology Services    Follow Up Recommendations  None    Frequency and Duration        Pertinent Vitals/Pain Pain Assessment: No/denies pain   SLP Goals  Patient/Family Stated Goal: to go home  SLP Evaluation Prior Functioning  Type of Home: House  Lives With: Daughter Available Help at Discharge: Family Education: pt reports daughter is retired   IT consultant  Overall Cognitive Status: Within Functional Limits for tasks assessed Arousal/Alertness: Awake/alert Orientation Level: Oriented X4 Attention: Sustained Sustained Attention: Appears intact Memory: Appears intact (recalled medical events prior to admission) Awareness: Impaired (pt states she thinks she can get up on her own, suspect behavioral not really awareness) Problem Solving: Appears intact (need to use call bell independently stated after reviewed by slp)    Comprehension  Auditory Comprehension Overall Auditory Comprehension: Appears within functional limits for tasks assessed Yes/No Questions: Not tested Commands: Within Functional Limits Conversation: Complex Interfering Components: Hearing Visual Recognition/Discrimination Discrimination: Not tested Reading Comprehension Reading Status: Not tested    Expression Expression Primary Mode of Expression: Verbal Verbal Expression Overall Verbal Expression: Appears within functional limits for tasks assessed Initiation: No impairment Level of Generative/Spontaneous Verbalization: [employer]  Repetition: No impairment Naming: Not tested Confrontation: Not tested Pragmatics: No  impairment Written Expression Dominant Hand: Right Written Expression: Not tested   Oral / Motor Oral Motor/Sensory Function Overall Oral Motor/Sensory Function: Appears within functional limits for tasks assessed Motor Speech Overall Motor Speech: Impaired Respiration: Within functional limits Resonance: Within functional limits Articulation: Impaired Intelligibility: Intelligible Motor Speech Errors: Not applicable Effective Techniques: Slow rate   GO     Mickie Bail [place], [place] [facility] SLP [phone]

## 2013-12-26 DIAGNOSIS — R569 Unspecified convulsions: Secondary | ICD-10-CM

## 2013-12-26 DIAGNOSIS — M542 Cervicalgia: Secondary | ICD-10-CM

## 2013-12-26 LAB — BASIC METABOLIC PANEL
Anion gap: 10 (ref 5–15)
BUN: 28 mg/dL — ABNORMAL HIGH (ref 6–23)
CO2: 25 mEq/L (ref 19–32)
Calcium: 9.5 mg/dL (ref 8.4–10.5)
Chloride: 101 mEq/L (ref 96–112)
Creatinine, Ser: 1.27 mg/dL — ABNORMAL HIGH (ref 0.50–1.10)
GFR calc Af Amer: 42 mL/min — ABNORMAL LOW (ref >90)
GFR calc non Af Amer: 37 mL/min — ABNORMAL LOW (ref >90)
Glucose, Bld: 118 mg/dL — ABNORMAL HIGH (ref 70–99)
Potassium: 4.9 mEq/L (ref 3.7–5.3)
Sodium: 136 mEq/L — ABNORMAL LOW (ref 137–147)

## 2013-12-26 LAB — CBC
HCT: 32.9 % — ABNORMAL LOW (ref 36.0–46.0)
Hemoglobin: 11.3 g/dL — ABNORMAL LOW (ref 12.0–15.0)
MCH: 30.5 pg (ref 26.0–34.0)
MCHC: 34.3 g/dL (ref 30.0–36.0)
MCV: 88.7 fL (ref 78.0–100.0)
Platelets: 207 10*3/uL (ref 150–400)
RBC: 3.71 MIL/uL — ABNORMAL LOW (ref 3.87–5.11)
RDW: 15.8 % — ABNORMAL HIGH (ref 11.5–15.5)
WBC: 4.7 10*3/uL (ref 4.0–10.5)

## 2013-12-26 MED ORDER — TRAMADOL HCL 50 MG PO TABS: 50.0000 mg | ORAL_TABLET | Freq: Two times a day (BID) | ORAL | Status: DC | PRN

## 2013-12-26 MED ORDER — ASPIRIN EC 81 MG PO TBEC: 81.0000 mg | DELAYED_RELEASE_TABLET | Freq: Every day | ORAL | Status: DC

## 2013-12-26 NOTE — Progress Notes (Signed)
*  PRELIMINARY RESULTS* Vascular Ultrasound Carotid Duplex (Doppler) has been completed.  Preliminary findings: Bilateral: 1-39% ICA stenosis.    Sharion Dove, RVS Entered by Landry Mellow  12/26/2013, 10:53 AM

## 2013-12-26 NOTE — Discharge Instructions (Signed)
Aphasia Aphasia is a neurological disorder caused by damage to the parts of the brain that control language. CAUSES  Aphasia is not a disease, but a symptom of brain damage. Aphasia is commonly seen in adults who have suffered a stroke. Aphasia also can result from:  A brain tumor.  Infection.  Head injury.  A rare type of dementia called Primary Progressive Aphasia. Common types of dementia may be associated with aphasia but can also exist without language problems. SYMPTOMS  Primary signs of the disorder include:  Problems expressing oneself when speaking.  Trouble understanding speech.  Difficulty with reading and writing.  Speaking in short or incomplete sentences.  Speaking in sentences that don't make sense.  Speaking unrecognizable words.  Interpreting figurative language literally.  Writing sentences that don't make sense. The type and severity of language problems depend on the precise location and extent of the damaged brain tissue. Aphasia can be divided into four broad categories:  Expressive aphasia - difficulty in conveying thoughts through speech or writing. The patient knows what they want to say, but cannot find the words they need.  Receptive aphasia - difficulty understanding spoken or written language. The patient hears the voice or sees the print but cannot make sense of the words.  Anomic or amnesia aphasia - difficulty in using the correct names for particular objects, people, places, or events. This is the least severe form of aphasia.  Global aphasia results from severe and extensive damage to the language areas of the brain. Patients lose almost all language function, both comprehension (understanding) and expression. They cannot speak, understand speech, read, or write. TREATMENT  Sometimes an individual will completely recover from aphasia without treatment. In most cases, language therapy should begin as soon as possible. Language therapy should  be tailored to the individual needs of the patient. Therapy with a speech pathologist involves exercises in which patients:  Read.  Write.  Follow directions.  Repeat what they hear.  Computer-aided therapy may also be used. PROGNOSIS  The outcome of aphasia is difficult to predict. People who are younger or have less extensive brain damage do better. The location of the injury is also important. The location is a clue to prognosis. In general, patients tend to recover skills in language comprehension (understanding) more completely than those skills involving expression (speaking or writing). Document Released: 12/10/2001 Document Revised: 06/12/2011 Document Reviewed: 06/09/2013 [facility] Patient Information 2015 [place], [place]. This information is not intended to replace advice given to you by your health care provider. Make sure you discuss any questions you have with your health care provider.

## 2013-12-26 NOTE — Discharge Summary (Signed)
Physician Discharge Summary  Shannon [name] ZOX:[id number] DOB: 06-01-1923 DOA: 12/23/2013  PCP: Oneal Grout, MD  Admit date: 12/23/2013 Discharge date: 12/26/2013  Time spent: 70 minutes  Recommendations for Outpatient Follow-up:  1. Followup with Oneal Grout, MD in 1 week. On followup patient's neck pain may be reassessed at that time. Patient will be set up with an event monitor as outpatient on because by cardiology.  Discharge Diagnoses:  Principal Problem:   Pre-syncope Active Problems:   Essential hypertension, benign   Seizures   GERD (gastroesophageal reflux disease)   Dyslipidemia   A-fib   Unspecified hypothyroidism   Slurred speech   Stroke   Discharge Condition: Stable and improved  Diet recommendation: Heart healthy  Filed Weights   12/23/13 1950 12/25/13 0440  Weight: 68.493 kg (151 lb) 67.722 kg (149 lb 4.8 oz)    History of present illness:  80 yo female h/o cva, ?seizure disorder, htn, hld brought in by her daughter for a episode of slurred speech earlier on the day of admission and not acting right. She was sitting at the table when she suddenly became diaphoretic, weak and slurred speech. 911 was called. Pt was brought in and code stroke was started. Pt did have recent surgery at [facility] to fix a hernia and an ulcer in her stomach. No recent illnesses or fevers. Pt denied any problem with her arms or legs, but was still having slurred speech.      Hospital Course:  #1 presyncope  Questionable etiology. May be secondary to vasovagal versus cardiogenic versus neurological. MRI of the head negative for any acute abnormalities. Patient has been seen by neurology. 2-D echo with no source for patient's syncopal episode. Carotid Dopplers which were done had no significant ICA stenosis. Patient was maintained on aspirin for secondary stroke prevention. Patient was maintained on a home regimen of Keppra for seizures. Patient was seen by neurology a stroke  workup which was done was negative. Patient will be discharged home and will be set up for an event monitor. Outpatient follow up. #2 neck pain  Questionable etiology. MRA of the neck suspicious for ICA stenosis and high-grade stenosis of left ACA with poor flowing vessel. Carotid Doppler which was done had no significant ICA stenosis. Outpatient followup.  #3 atrial fibrillation  Patient remained rate controlled during the hospitalization. Patient deemed not an anticoagulation candidate. The patient was placed on aspirin.  #4 history of CVA  Patient was on Plavix which has subsequently been discontinued secondary to recent GI bleed. Continued on aspirin for secondary stroke prevention. Neurology following.  #5 hypothyroidism  Continued on home regimen of Synthroid.  #6 hyperlipidemia  LDL of 26. Continued on home regimen of statin.  #7 hypertension  Stable. Continued on home regimen of Norvasc, hydralazine, imdur, Toprol-XL.  #8 depression  Stable. Continued on home regimen of Zoloft.  #9 type 2 diabetes  Hemoglobin A1c 6.9. Outpatient followup.  #10 peripheral neuropathy  Continued on Neurontin and metanx. #11 history of perforated ulcer  Patient with recent GI bleed and perforated ulcer and Plavix was discontinued. Patient was maintained on her home regimen of Sucrafate and PPI   Procedures: 2-D echo 12/24/2013  MRI MRA of the head 12/24/2013  Carotid Dopplers 12/26/13 CT head 12/23/2013   Consultations: Neurology: Dr. Amada Jupiter 12/23/2013   Discharge Exam: Filed Vitals:   12/26/13 1603  BP: 121/54  Pulse: 55  Temp: 97.5 F (36.4 C)  Resp: 16    General: NAD Cardiovascular: RRR  Respiratory: CTAB  Discharge Instructions You were cared for by a hospitalist during your hospital stay. If you have any questions about your discharge medications or the care you received while you were in the hospital after you are discharged, you can call the unit and asked to speak with  the hospitalist on call if the hospitalist that took care of you is not available. Once you are discharged, your primary care physician will handle any further medical issues. Please note that NO REFILLS for any discharge medications will be authorized once you are discharged, as it is imperative that you return to your primary care physician (or establish a relationship with a primary care physician if you do not have one) for your aftercare needs so that they can reassess your need for medications and monitor your lab values.  Discharge Instructions   Diet - low sodium heart healthy    Complete by:  As directed   Mechanical soft     Discharge instructions    Complete by:  As directed   Follow up with Oneal Grout, MD in 1 week.     Increase activity slowly    Complete by:  As directed           Current Discharge Medication List    START taking these medications   Details  aspirin EC 81 MG tablet Take 1 tablet (81 mg total) by mouth daily.    traMADol (ULTRAM) 50 MG tablet Take 1 tablet (50 mg total) by mouth every 12 (twelve) hours as needed for moderate pain. Qty: 30 tablet, Refills: 0      CONTINUE these medications which have NOT CHANGED   Details  acetaminophen (TYLENOL EX ST ARTHRITIS PAIN) 500 MG tablet Take 500 mg by mouth every 6 (six) hours as needed (for arthritis pain).    amLODipine (NORVASC) 10 MG tablet Take 10 mg by mouth daily.    atorvastatin (LIPITOR) 10 MG tablet Take 10 mg by mouth daily at 6 PM.    cycloSPORINE (RESTASIS) 0.05 % ophthalmic emulsion Place 1 drop into both eyes 2 (two) times daily.    Dietary Management Product (VASCULERA) TABS Take 1 tablet by mouth daily.    fluticasone (FLONASE) 50 MCG/ACT nasal spray Place 2 sprays into both nostrils daily.    gabapentin (NEURONTIN) 300 MG capsule Take 300 mg by mouth 3 (three) times daily.    hydrALAZINE (APRESOLINE) 25 MG tablet Take 25 mg by mouth 2 (two) times daily.     isosorbide mononitrate  (IMDUR) 30 MG 24 hr tablet Take 30 mg by mouth daily.    l-methylfolate-B6-B12 (METANX) 3-35-2 MG TABS Take 1 tablet by mouth daily.    levETIRAcetam (KEPPRA) 250 MG tablet Take 250 mg by mouth 2 (two) times daily.    levothyroxine (SYNTHROID, LEVOTHROID) 175 MCG tablet Take 175 mcg by mouth daily before breakfast.    losartan (COZAAR) 100 MG tablet Take 100 mg by mouth daily.    magnesium oxide (MAG-OX) 400 MG tablet Take 1 tablet (400 mg total) by mouth 2 (two) times daily. Qty: 120 tablet, Refills: 11   Associated Diagnoses: Hypomagnesemia    metoprolol succinate (TOPROL-XL) 50 MG 24 hr tablet Take 50 mg by mouth daily. Take with or immediately following a meal.    Multiple Vitamins-Minerals (MULTIVITAMIN WITH MINERALS) tablet Take 1 tablet by mouth daily.    pantoprazole (PROTONIX) 40 MG tablet Take 40 mg by mouth daily.    sennosides-docusate sodium (SENOKOT-S) 8.6-50 MG tablet  Take 1 tablet by mouth 2 (two) times daily.     sertraline (ZOLOFT) 100 MG tablet Take 100 mg by mouth daily.    sucralfate (CARAFATE) 1 G tablet Take 1 g by mouth 3 (three) times daily with meals.       No Known Allergies Follow-up Information   Follow up with Oneal Grout, MD.   Specialty:  Internal Medicine   Contact information:   [address] [place] [address] [phone]       Please follow up. (PT WILL BE CALLED TO BE SET UP WITH EVENT MONITOR ON MONDAY)        The results of significant diagnostics from this hospitalization (including imaging, microbiology, ancillary and laboratory) are listed below for reference.    Significant Diagnostic Studies: Ct Head Wo Contrast  12/23/2013   CLINICAL DATA:  Slurred speech  EXAM: CT HEAD WITHOUT CONTRAST  TECHNIQUE: Contiguous axial images were obtained from the base of the skull through the vertex without intravenous contrast.  COMPARISON:  None.  FINDINGS: There is no evidence of mass effect, midline shift, or extra-axial fluid  collections. There is no evidence of a space-occupying lesion or intracranial hemorrhage. There is no evidence of a cortical-based area of acute infarction. There is generalized cerebral atrophy. There is periventricular white matter low attenuation likely secondary to microangiopathy.  The ventricles and sulci are appropriate for the patient's age. The basal cisterns are patent.  Visualized portions of the orbits are unremarkable. The visualized portions of the paranasal sinuses and mastoid air cells are unremarkable. Cerebrovascular atherosclerotic calcifications are noted.  The osseous structures are unremarkable.  IMPRESSION: No acute intracranial pathology.  These results were called by telephone at the time of interpretation on 12/23/2013 at 8:50 pm to Dr. Devoria Albe , who verbally acknowledged these results.   Electronically Signed   By: Elige Ko   On: 12/23/2013 20:50   [name] Head Wo Contrast  12/24/2013   CLINICAL DATA:  80 year old female with episode of slurred speech, weakness and diaphoresis. Code stroke. Query seizure. Initial encounter.  EXAM: MRI HEAD WITHOUT CONTRAST  MRA HEAD WITHOUT CONTRAST  MRA NECK WITHOUT CONTRAST  TECHNIQUE: Multiplanar, multiecho pulse sequences of the brain and surrounding structures were obtained without intravenous contrast. Angiographic images of the Circle of Willis were obtained using MRA technique without intravenous contrast. Angiographic images of the neck were obtained using MRA technique without intravenous contrast. Carotid stenosis measurements (when applicable) are obtained utilizing NASCET criteria, using the distal internal carotid diameter as the denominator.  COMPARISON:  Head CT without contrast 12/23/2013 at 2032 hrs.  FINDINGS: MRI HEAD FINDINGS  Study is intermittently degraded by motion artifact despite repeated imaging attempts.  No restricted diffusion to suggest acute infarction. No midline shift, mass effect, evidence of mass lesion,  ventriculomegaly, extra-axial collection or acute intracranial hemorrhage. Cervicomedullary junction and pituitary are within normal limits. Major intracranial vascular flow voids are preserved.  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 Patchy cerebral white matter T2 and FLAIR hyperintensity, with some white matter signal changes most resembling chronic lacunar infarcts. Occasional chronic micro hemorrhages in the brain.  Visible internal auditory structures appear normal. Visualized paranasal sinuses and mastoids are clear. Postoperative changes to the globes. Visualized scalp soft tissues are within normal limits. Normal bone marrow signal.  Heterogeneity of the C3-C4 endplates in the visible upper cervical spine is felt to be degenerative in nature.  MRA HEAD FINDINGS  Antegrade  flow in the posterior circulation. Codominant distal vertebral arteries. Normal PICA and AICA origins. Patent vertebrobasilar junction. No basilar stenosis. SCA and PCA origins are within normal limits. Tortuous right posterior communicating artery, the left is diminutive or absent. Bilateral PCA branches are within normal limits.  Antegrade flow in both ICA siphons. Tortuous distal cervical right ICA. Siphon irregularity in keeping with atherosclerosis, but no siphon stenosis. Ophthalmic and right posterior communicating artery origins are within normal limits. Patent carotid termini. Normal MCA and ACA origins. Anterior communicating artery within normal limits.  High-grade tandem left ACA A2 segment and pericholecystic all region stenosis, with attenuated left ACA flow. See series 8, image 46. Visualized right ACA branches are within normal limits.  Bilateral MCA M1 segments are within normal limits. Both MCA bifurcations are patent. Visualized bilateral MCA branches are within normal limits.  MRA NECK FINDINGS  Time-of-flight imaging demonstrates antegrade flow in both carotid and vertebral  arteries in the neck. There is irregularity and loss of flow signal at both carotid bifurcations, greater on the right. The appearance of the right ICA origin is suspicious for hemodynamically significant stenosis (series 16, image 35). Tortuous distal cervical right ICA.  IMPRESSION: 1. No acute intracranial abnormality. Moderately advanced chronic small vessel ischemia. 2. Carotid atherosclerosis and tortuosity in the neck, greater on the right. The appearance is suspicious for right ICA origin hemodynamically significant stenosis. Carotid doppler may be the simplest way to confirm. 3. 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 4. Otherwise negative for age intracranial MRA.   Electronically Signed   By: Augusto Gamble M.D.   On: 12/24/2013 11:50   Mr Angiogram Neck Wo Contrast  12/24/2013   CLINICAL DATA:  80 year old female with episode of slurred speech, weakness and diaphoresis. Code stroke. Query seizure. Initial encounter.  EXAM: MRI HEAD WITHOUT CONTRAST  MRA HEAD WITHOUT CONTRAST  MRA NECK WITHOUT CONTRAST  TECHNIQUE: Multiplanar, multiecho pulse sequences of the brain and surrounding structures were obtained without intravenous contrast. Angiographic images of the Circle of Willis were obtained using MRA technique without intravenous contrast. Angiographic images of the neck were obtained using MRA technique without intravenous contrast. Carotid stenosis measurements (when applicable) are obtained utilizing NASCET criteria, using the distal internal carotid diameter as the denominator.  COMPARISON:  Head CT without contrast 12/23/2013 at 2032 hrs.  FINDINGS: MRI HEAD FINDINGS  Study is intermittently degraded by motion artifact despite repeated imaging attempts.  No restricted diffusion to suggest acute infarction. No midline shift, mass effect, evidence of mass lesion, ventriculomegaly, extra-axial collection or acute intracranial hemorrhage. Cervicomedullary  junction and pituitary are within normal limits. Major intracranial vascular flow voids are preserved.  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 Patchy cerebral white matter T2 and FLAIR hyperintensity, with some white matter signal changes most resembling chronic lacunar infarcts. Occasional chronic micro hemorrhages in the brain.  Visible internal auditory structures appear normal. Visualized paranasal sinuses and mastoids are clear. Postoperative changes to the globes. Visualized scalp soft tissues are within normal limits. Normal bone marrow signal.  Heterogeneity of the C3-C4 endplates in the visible upper cervical spine is felt to be degenerative in nature.  MRA HEAD FINDINGS  Antegrade flow in the posterior circulation. Codominant distal vertebral arteries. Normal PICA and AICA origins. Patent vertebrobasilar junction. No basilar stenosis. SCA and PCA origins are within normal limits. Tortuous right posterior communicating artery, the left is diminutive or absent. Bilateral PCA branches  are within normal limits.  Antegrade flow in both ICA siphons. Tortuous distal cervical right ICA. Siphon irregularity in keeping with atherosclerosis, but no siphon stenosis. Ophthalmic and right posterior communicating artery origins are within normal limits. Patent carotid termini. Normal MCA and ACA origins. Anterior communicating artery within normal limits.  High-grade tandem left ACA A2 segment and pericholecystic all region stenosis, with attenuated left ACA flow. See series 8, image 46. Visualized right ACA branches are within normal limits.  Bilateral MCA M1 segments are within normal limits. Both MCA bifurcations are patent. Visualized bilateral MCA branches are within normal limits.  MRA NECK FINDINGS  Time-of-flight imaging demonstrates antegrade flow in both carotid and vertebral arteries in the neck. There is irregularity and loss of flow signal at both carotid  bifurcations, greater on the right. The appearance of the right ICA origin is suspicious for hemodynamically significant stenosis (series 16, image 35). Tortuous distal cervical right ICA.  IMPRESSION: 1. No acute intracranial abnormality. Moderately advanced chronic small vessel ischemia. 2. Carotid atherosclerosis and tortuosity in the neck, greater on the right. The appearance is suspicious for right ICA origin hemodynamically significant stenosis. Carotid doppler may be the simplest way to confirm. 3. 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 4. Otherwise negative for age intracranial MRA.   Electronically Signed   By: Augusto Gamble M.D.   On: 12/24/2013 11:50   Mr Brain Wo Contrast  12/24/2013   CLINICAL DATA:  80 year old female with episode of slurred speech, weakness and diaphoresis. Code stroke. Query seizure. Initial encounter.  EXAM: MRI HEAD WITHOUT CONTRAST  MRA HEAD WITHOUT CONTRAST  MRA NECK WITHOUT CONTRAST  TECHNIQUE: Multiplanar, multiecho pulse sequences of the brain and surrounding structures were obtained without intravenous contrast. Angiographic images of the Circle of Willis were obtained using MRA technique without intravenous contrast. Angiographic images of the neck were obtained using MRA technique without intravenous contrast. Carotid stenosis measurements (when applicable) are obtained utilizing NASCET criteria, using the distal internal carotid diameter as the denominator.  COMPARISON:  Head CT without contrast 12/23/2013 at 2032 hrs.  FINDINGS: MRI HEAD FINDINGS  Study is intermittently degraded by motion artifact despite repeated imaging attempts.  No restricted diffusion to suggest acute infarction. No midline shift, mass effect, evidence of mass lesion, ventriculomegaly, extra-axial collection or acute intracranial hemorrhage. Cervicomedullary junction and pituitary are within normal limits. Major intracranial vascular flow voids are  preserved.  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 Patchy cerebral white matter T2 and FLAIR hyperintensity, with some white matter signal changes most resembling chronic lacunar infarcts. Occasional chronic micro hemorrhages in the brain.  Visible internal auditory structures appear normal. Visualized paranasal sinuses and mastoids are clear. Postoperative changes to the globes. Visualized scalp soft tissues are within normal limits. Normal bone marrow signal.  Heterogeneity of the C3-C4 endplates in the visible upper cervical spine is felt to be degenerative in nature.  MRA HEAD FINDINGS  Antegrade flow in the posterior circulation. Codominant distal vertebral arteries. Normal PICA and AICA origins. Patent vertebrobasilar junction. No basilar stenosis. SCA and PCA origins are within normal limits. Tortuous right posterior communicating artery, the left is diminutive or absent. Bilateral PCA branches are within normal limits.  Antegrade flow in both ICA siphons. Tortuous distal cervical right ICA. Siphon irregularity in keeping with atherosclerosis, but no siphon stenosis. Ophthalmic and right posterior communicating artery origins are within normal limits. Patent carotid termini. Normal MCA and  ACA origins. Anterior communicating artery within normal limits.  High-grade tandem left ACA A2 segment and pericholecystic all region stenosis, with attenuated left ACA flow. See series 8, image 46. Visualized right ACA branches are within normal limits.  Bilateral MCA M1 segments are within normal limits. Both MCA bifurcations are patent. Visualized bilateral MCA branches are within normal limits.  MRA NECK FINDINGS  Time-of-flight imaging demonstrates antegrade flow in both carotid and vertebral arteries in the neck. There is irregularity and loss of flow signal at both carotid bifurcations, greater on the right. The appearance of the right ICA origin is suspicious for  hemodynamically significant stenosis (series 16, image 35). Tortuous distal cervical right ICA.  IMPRESSION: 1. No acute intracranial abnormality. Moderately advanced chronic small vessel ischemia. 2. Carotid atherosclerosis and tortuosity in the neck, greater on the right. The appearance is suspicious for right ICA origin hemodynamically significant stenosis. Carotid doppler may be the simplest way to confirm. 3. 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 4. Otherwise negative for age intracranial MRA.   Electronically Signed   By: Augusto Gamble M.D.   On: 12/24/2013 11:50    Microbiology: No results found for this or any previous visit (from the past 240 hour(s)).   Labs: Basic Metabolic Panel:  Recent Labs Lab 12/23/13 2019 12/26/13 0730  NA 133* 136*  K 4.6 4.9  CL 96 101  CO2 26 25  GLUCOSE 144* 118*  BUN 26* 28*  CREATININE 1.50* 1.27*  CALCIUM 10.0 9.5   Liver Function Tests:  Recent Labs Lab 12/23/13 2019  AST 22  ALT 16  ALKPHOS 79  BILITOT 0.3  PROT 7.6  ALBUMIN 4.0   No results found for this basename: LIPASE, AMYLASE,  in the last 168 hours No results found for this basename: AMMONIA,  in the last 168 hours CBC:  Recent Labs Lab 12/23/13 2019 12/26/13 0730  WBC 5.8 4.7  HGB 11.0* 11.3*  HCT 32.3* 32.9*  MCV 90.2 88.7  PLT 199 207   Cardiac Enzymes: No results found for this basename: CKTOTAL, CKMB, CKMBINDEX, TROPONINI,  in the last 168 hours BNP: BNP (last 3 results) No results found for this basename: PROBNP,  in the last 8760 hours CBG:  Recent Labs Lab 12/23/13 2040  GLUCAP 143*       Signed:  THOMPSON,DANIEL MD [facility] 12/26/2013, 6:05 PM

## 2013-12-26 NOTE — Progress Notes (Signed)
Physical Therapy Treatment/ Discharge Patient Details Name: Shannon [name] MRN: [id number] DOB: 12-19-1923 Today's Date: 12/26/2013    History of Present Illness 80 yo female h/o cva, ?seizure disorder, htn, hld brought in by her daughter for a episode of slurred speech earlier today and not acting right.  She was sitting at the table when she suddenly became diaphoretic, weak and slurred speech.  911 was called.  Pt was brought in and code stroke was started.  MRI neg. for CVA    PT Comments    Pt with excellent progression today mobilizing with cues for position in RW only. Pt encouraged to increase ambulation and activity at home. Dgtr goes to gym and wants to take pt which was encouraged as pt states she has been at home thinking a lot about spouse's passing. Pt very pleasant no dizziness throughout mobility and currently back to baseline. Pt encouraged to have supervision for bathing, use shower seat and progress activity, Pt and dgtr educated throughout with verbalized understanding. Pt at baseline with plan for continued activity in community at the gym without further need for acute P.T. Will sign off with family in agreement. Recommend daily ambulation with nursing.   Follow Up Recommendations  No PT follow up     Equipment Recommendations  None recommended by PT    Recommendations for Other Services       Precautions / Restrictions Precautions Precautions: Fall    Mobility  Bed Mobility Overal bed mobility: Modified Independent                Transfers Overall transfer level: Modified independent                  Ambulation/Gait Ambulation/Gait assistance: Supervision Ambulation Distance (Feet): 400 Feet Assistive device: Rolling walker (2 wheeled) Gait Pattern/deviations: Step-through pattern;Decreased stride length Gait velocity: 14/20=.7 ft/sec Gait velocity interpretation: <1.8 ft/sec, indicative of risk for recurrent falls General Gait Details: cues  for posture and position in RW   Stairs            Wheelchair Mobility    Modified Rankin (Stroke Patients Only)       Balance Overall balance assessment: Needs assistance   Sitting balance-Leahy Scale: Good       Standing balance-Leahy Scale: Fair                      Cognition Arousal/Alertness: Awake/alert Behavior During Therapy: WFL for tasks assessed/performed Overall Cognitive Status: Within Functional Limits for tasks assessed                      Exercises General Exercises - Lower Extremity Ankle Circles/Pumps: AROM;Seated;Both;20 reps Long Arc Quad: AROM;Seated;Both;20 reps Hip ABduction/ADduction: AROM;Seated;Both;20 reps Hip Flexion/Marching: AROM;Seated;Both;20 reps    General Comments        Pertinent Vitals/Pain Pain Assessment: No/denies pain    Home Living                      Prior Function            PT Goals (current goals can now be found in the care plan section) Progress towards PT goals: Goals met/education completed, patient discharged from PT    Frequency       PT Plan Discharge plan needs to be updated    Co-evaluation             End of Session   Activity Tolerance: Patient  tolerated treatment well Patient left: in chair;with call bell/phone within reach;with chair alarm set;with family/visitor present     Time: 0933-1000 PT Time Calculation (min): 27 min  Charges:  $Gait Training: 8-22 mins $Therapeutic Exercise: 8-22 mins                    G Codes:      Delorse Lek 09-Jan-2014, 10:40 AM Delaney Meigs, PT [phone]

## 2014-01-07 ENCOUNTER — Encounter: Payer: Self-pay | Admitting: Internal Medicine

## 2014-01-07 ENCOUNTER — Ambulatory Visit (INDEPENDENT_AMBULATORY_CARE_PROVIDER_SITE_OTHER): Payer: Medicare Other | Admitting: Internal Medicine

## 2014-01-07 VITALS — BP 148/66 | HR 50 | Temp 97.8°F | Ht 66.0 in | Wt 155.0 lb

## 2014-01-07 DIAGNOSIS — I639 Cerebral infarction, unspecified: Secondary | ICD-10-CM

## 2014-01-07 DIAGNOSIS — I482 Chronic atrial fibrillation, unspecified: Secondary | ICD-10-CM

## 2014-01-07 DIAGNOSIS — R55 Syncope and collapse: Secondary | ICD-10-CM

## 2014-01-07 DIAGNOSIS — I6529 Occlusion and stenosis of unspecified carotid artery: Secondary | ICD-10-CM | POA: Insufficient documentation

## 2014-01-07 DIAGNOSIS — I10 Essential (primary) hypertension: Secondary | ICD-10-CM

## 2014-01-07 DIAGNOSIS — I6523 Occlusion and stenosis of bilateral carotid arteries: Secondary | ICD-10-CM

## 2014-01-07 MED ORDER — HYDRALAZINE HCL 50 MG PO TABS: 50.0000 mg | ORAL_TABLET | Freq: Three times a day (TID) | ORAL | Status: DC

## 2014-01-07 NOTE — Progress Notes (Signed)
Patient ID: Shannon [name], female   DOB: [birth year], 80 y.o.   MRN: [id number]    Chief Complaint  Patient presents with  . Hospitalization Follow-up  . Hypertension    B/P concerns - elevated    No Known Allergies  HPI 80 y/o female patient is here for follow up post hospital admission with presyncope. Neurology was consulted. Ct head , mri brain unremarkable for acute stroke. echocardiogram was unremarkable. mra neck was s/o severe carotid stenosis and pt mentions carotid doppler was done. No carotid doppler report for review. Pt was discharged with follow up for event monitor with cardiology. No appointment on review. Pt denies any further such episodes since discharge. Feels good in terms of her energy., bp has been on higher side at home. Elevated bp reading in office. No toher cocnerns from pt. Energy level improving. Using walker. No falls.  Review of Systems  Constitutional: Negative for fever, chills, malaise/fatigue and diaphoresis.  HENT: Negative for congestion, hearing loss and sore throat.   Eyes: Negative for blurred vision, double vision and discharge.  Respiratory: Negative for cough, sputum production, shortness of breath and wheezing.   Cardiovascular: Negative for chest pain, palpitations, orthopnea. Has leg swelling.  Gastrointestinal: Negative for heartburn, nausea, vomiting, abdominal pain Genitourinary: Negative for dysuria.  Musculoskeletal: Negative for back pain, falls. Uses walker Skin: Negative for itching and rash.  Neurological: Negative for dizziness, tingling, focal weakness and headaches.  Psychiatric/Behavioral: Negative for depression. The patient is not nervous/anxious.   Past Medical History  Diagnosis Date  . Pneumonia   . Thyroid disease   . Seizures   . Stroke   . Hyperlipidemia   . Hypertension   . Diabetes mellitus without complication   . Small bowel obstruction   . Ventral hernia with bowel obstruction    Past Surgical History    Procedure Laterality Date  . Exploratory laparotomy  09-07-2013  . Cholecystectomy    . Tubal ligation    . Colectomy    . Hernia repair  2015   Current Outpatient Prescriptions on File Prior to Visit  Medication Sig Dispense Refill  . acetaminophen (TYLENOL EX ST ARTHRITIS PAIN) 500 MG tablet Take 500 mg by mouth every 6 (six) hours as needed (for arthritis pain).      [name] aspirin EC 81 MG tablet Take 1 tablet (81 mg total) by mouth daily.      [name] atorvastatin (LIPITOR) 10 MG tablet Take 10 mg by mouth daily at 6 PM.      . cycloSPORINE (RESTASIS) 0.05 % ophthalmic emulsion Place 1 drop into both eyes 2 (two) times daily.      . Dietary Management Product (VASCULERA) TABS Take 1 tablet by mouth daily.      . fluticasone (FLONASE) 50 MCG/ACT nasal spray Place 2 sprays into both nostrils daily.      [name] gabapentin (NEURONTIN) 300 MG capsule Take 300 mg by mouth 3 (three) times daily.      . isosorbide mononitrate (IMDUR) 30 MG 24 hr tablet Take 30 mg by mouth daily.      [name] l-methylfolate-B6-B12 (METANX) 3-35-2 MG TABS Take 2 tablets by mouth daily.       [name] levothyroxine (SYNTHROID, LEVOTHROID) 175 MCG tablet Take 175 mcg by mouth daily before breakfast.      . losartan (COZAAR) 100 MG tablet Take 100 mg by mouth daily.      . metoprolol succinate (TOPROL-XL) 50 MG 24 hr tablet Take 50 mg by  mouth daily. Take with or immediately following a meal.      . Multiple Vitamins-Minerals (MULTIVITAMIN WITH MINERALS) tablet Take 1 tablet by mouth daily.      . pantoprazole (PROTONIX) 40 MG tablet Take 40 mg by mouth daily.      . sennosides-docusate sodium (SENOKOT-S) 8.6-50 MG tablet Take 1 tablet by mouth 2 (two) times daily.       . sertraline (ZOLOFT) 100 MG tablet Take 100 mg by mouth daily.      . sucralfate (CARAFATE) 1 G tablet Take 1 g by mouth 3 (three) times daily with meals.      . traMADol (ULTRAM) 50 MG tablet Take 1 tablet (50 mg total) by mouth every 12 (twelve) hours as needed for  moderate pain.  30 tablet  0   No current facility-administered medications on file prior to visit.   Physical exam BP 148/66  Pulse 50  Temp(Src) 97.8 F (36.6 C) (Oral)  Ht 5\' 6"  (1.676 m)  Wt 155 lb (70.308 kg)  BMI 25.03 kg/m2  SpO2 96%   General- elderly female in no acute distress Head- atraumatic, normocephalic Eyes- PERRLA, EOMI, no pallor, no icterus, no discharge Neck- no lymphadenopathy, no thyromegaly Mouth- normal mucus membrane, no oral thrush, normal oropharynx Cardiovascular- normal s1,s2, no murmurs, trace edema Respiratory- bilateral clear to auscultation, no wheeze, no rhonchi, no crackles Abdomen- bowel sounds present, soft, non tender Musculoskeletal- able to move all 4 extremities, uses a walker, normal ROM, no spinal tenderness Neurological- no focal deficit Skin- warm and dry Psychiatry- alert and oriented, normal mood and affect  Labs- Lab Results  Component Value Date   WBC 4.7 12/26/2013   HGB 11.3* 12/26/2013   HCT 32.9* 12/26/2013   MCV 88.7 12/26/2013   PLT 207 12/26/2013   CMP     Component Value Date/Time   NA 136* 12/26/2013 0730   NA 127* 09/30/2013   K 4.9 12/26/2013 0730   CL 101 12/26/2013 0730   CO2 25 12/26/2013 0730   GLUCOSE 118* 12/26/2013 0730   BUN 28* 12/26/2013 0730   BUN 19 09/30/2013   CREATININE 1.27* 12/26/2013 0730   CREATININE 1.0 09/30/2013   CALCIUM 9.5 12/26/2013 0730   PROT 7.6 12/23/2013 2019   ALBUMIN 4.0 12/23/2013 2019   AST 22 12/23/2013 2019   ALT 16 12/23/2013 2019   ALKPHOS 79 12/23/2013 2019   BILITOT 0.3 12/23/2013 2019   GFRNONAA 37* 12/26/2013 0730   GFRAA 42* 12/26/2013 0730   Lab Results  Component Value Date   HGBA1C 6.9* 12/24/2013   Assessment/plan  1. Chronic atrial fibrillation Rate controlled. Continue toprol xl with lipitor and baby aspirin - Ambulatory referral to Cardiology  2. CVA (cerebral vascular accident) Stable, continue walker,continue bp meds with aspirin.bp med adjusted, see  below  3. Essential hypertension, benign Elevated bp, d/c amlodipine and change hydralazine to 50 mg tid for now. Continue toprol and cozaar current regimen  4. Pre-syncope Will need event monitor to assess for arrythmia, pending one post discharge from hospital, no appointment yet, refer to cardiology - Ambulatory referral to Cardiology  5. Carotid stenosis, bilateral Seen in MRA, pt mentions having doppler ultrasound of neck in hospital, unable to review one in epic. Thus will order a carotid doppler and if significant stenosis noted, will refer to vascular. Continue aspirin

## 2014-01-09 ENCOUNTER — Other Ambulatory Visit: Payer: Self-pay | Admitting: *Deleted

## 2014-01-09 MED ORDER — L-METHYLFOLATE-B6-B12 3-35-2 MG PO TABS: ORAL_TABLET | ORAL | Status: DC

## 2014-01-09 MED ORDER — MAGNESIUM OXIDE 400 MG PO TABS: ORAL_TABLET | ORAL | Status: DC

## 2014-01-09 NOTE — Telephone Encounter (Signed)
Shannon [name] patient caregiver called and stated that they needed a new Rx faxed to pharmacy stating the changes in medication that Dr. Bubba Camp changed at appointment. Faxed

## 2014-01-14 ENCOUNTER — Telehealth: Payer: Self-pay | Admitting: *Deleted

## 2014-01-14 NOTE — Telephone Encounter (Signed)
Change her hydralazine to 50 mg four times a day and monitor bp, make appointment to see meif no improvement after a week.

## 2014-01-14 NOTE — Telephone Encounter (Signed)
Shannon [name], patients daughter called and stated that patients medication was changed but her BP has been running high.  209/109 182/96 Please Advise.

## 2014-01-15 MED ORDER — HYDRALAZINE HCL 50 MG PO TABS: ORAL_TABLET | ORAL | Status: DC

## 2014-01-15 NOTE — Telephone Encounter (Signed)
[name] Notified and understood. Changed in computer.

## 2014-01-17 ENCOUNTER — Inpatient Hospital Stay (HOSPITAL_COMMUNITY)
Admission: EM | Admit: 2014-01-17 | Discharge: 2014-01-22 | DRG: 381 | Disposition: A | Discharge status: Home / Self Care | Payer: Medicare Other | Attending: Internal Medicine | Admitting: Internal Medicine

## 2014-01-17 ENCOUNTER — Encounter (HOSPITAL_COMMUNITY): Payer: Self-pay | Admitting: Emergency Medicine

## 2014-01-17 ENCOUNTER — Emergency Department (HOSPITAL_COMMUNITY): Payer: Medicare Other

## 2014-01-17 DIAGNOSIS — T182XXA Foreign body in stomach, initial encounter: Secondary | ICD-10-CM | POA: Diagnosis not present

## 2014-01-17 DIAGNOSIS — N183 Chronic kidney disease, stage 3 unspecified: Secondary | ICD-10-CM

## 2014-01-17 DIAGNOSIS — I10 Essential (primary) hypertension: Secondary | ICD-10-CM | POA: Diagnosis not present

## 2014-01-17 DIAGNOSIS — Z8711 Personal history of peptic ulcer disease: Secondary | ICD-10-CM

## 2014-01-17 DIAGNOSIS — Z823 Family history of stroke: Secondary | ICD-10-CM | POA: Diagnosis not present

## 2014-01-17 DIAGNOSIS — I973 Postprocedural hypertension: Secondary | ICD-10-CM | POA: Diagnosis present

## 2014-01-17 DIAGNOSIS — I248 Other forms of acute ischemic heart disease: Secondary | ICD-10-CM | POA: Diagnosis not present

## 2014-01-17 DIAGNOSIS — T182XXS Foreign body in stomach, sequela: Secondary | ICD-10-CM

## 2014-01-17 DIAGNOSIS — K219 Gastro-esophageal reflux disease without esophagitis: Secondary | ICD-10-CM

## 2014-01-17 DIAGNOSIS — T182XXD Foreign body in stomach, subsequent encounter: Secondary | ICD-10-CM | POA: Diagnosis not present

## 2014-01-17 DIAGNOSIS — D649 Anemia, unspecified: Secondary | ICD-10-CM | POA: Diagnosis not present

## 2014-01-17 DIAGNOSIS — E039 Hypothyroidism, unspecified: Secondary | ICD-10-CM | POA: Diagnosis not present

## 2014-01-17 DIAGNOSIS — R569 Unspecified convulsions: Secondary | ICD-10-CM

## 2014-01-17 DIAGNOSIS — Z8673 Personal history of transient ischemic attack (TIA), and cerebral infarction without residual deficits: Secondary | ICD-10-CM | POA: Diagnosis not present

## 2014-01-17 DIAGNOSIS — I129 Hypertensive chronic kidney disease with stage 1 through stage 4 chronic kidney disease, or unspecified chronic kidney disease: Secondary | ICD-10-CM | POA: Diagnosis present

## 2014-01-17 DIAGNOSIS — Z9049 Acquired absence of other specified parts of digestive tract: Secondary | ICD-10-CM | POA: Diagnosis present

## 2014-01-17 DIAGNOSIS — W44F1XS Bezoar entering into or through a natural orifice, sequela: Secondary | ICD-10-CM

## 2014-01-17 DIAGNOSIS — D638 Anemia in other chronic diseases classified elsewhere: Secondary | ICD-10-CM

## 2014-01-17 DIAGNOSIS — IMO0002 Reserved for concepts with insufficient information to code with codable children: Secondary | ICD-10-CM

## 2014-01-17 DIAGNOSIS — N179 Acute kidney failure, unspecified: Secondary | ICD-10-CM | POA: Diagnosis present

## 2014-01-17 DIAGNOSIS — Z90710 Acquired absence of both cervix and uterus: Secondary | ICD-10-CM | POA: Diagnosis not present

## 2014-01-17 DIAGNOSIS — F329 Major depressive disorder, single episode, unspecified: Secondary | ICD-10-CM | POA: Diagnosis present

## 2014-01-17 DIAGNOSIS — R0789 Other chest pain: Secondary | ICD-10-CM

## 2014-01-17 DIAGNOSIS — E785 Hyperlipidemia, unspecified: Secondary | ICD-10-CM

## 2014-01-17 DIAGNOSIS — I639 Cerebral infarction, unspecified: Secondary | ICD-10-CM | POA: Diagnosis not present

## 2014-01-17 DIAGNOSIS — E1151 Type 2 diabetes mellitus with diabetic peripheral angiopathy without gangrene: Secondary | ICD-10-CM

## 2014-01-17 DIAGNOSIS — K3184 Gastroparesis: Secondary | ICD-10-CM | POA: Diagnosis present

## 2014-01-17 DIAGNOSIS — E1140 Type 2 diabetes mellitus with diabetic neuropathy, unspecified: Secondary | ICD-10-CM | POA: Diagnosis present

## 2014-01-17 DIAGNOSIS — E1143 Type 2 diabetes mellitus with diabetic autonomic (poly)neuropathy: Secondary | ICD-10-CM

## 2014-01-17 DIAGNOSIS — K222 Esophageal obstruction: Secondary | ICD-10-CM | POA: Diagnosis present

## 2014-01-17 DIAGNOSIS — E038 Other specified hypothyroidism: Secondary | ICD-10-CM

## 2014-01-17 DIAGNOSIS — W44F1XD Bezoar entering into or through a natural orifice, subsequent encounter: Secondary | ICD-10-CM

## 2014-01-17 DIAGNOSIS — E871 Hypo-osmolality and hyponatremia: Secondary | ICD-10-CM | POA: Diagnosis present

## 2014-01-17 DIAGNOSIS — E860 Dehydration: Secondary | ICD-10-CM | POA: Diagnosis present

## 2014-01-17 DIAGNOSIS — E1159 Type 2 diabetes mellitus with other circulatory complications: Secondary | ICD-10-CM | POA: Diagnosis not present

## 2014-01-17 DIAGNOSIS — I2510 Atherosclerotic heart disease of native coronary artery without angina pectoris: Secondary | ICD-10-CM | POA: Diagnosis present

## 2014-01-17 DIAGNOSIS — K210 Gastro-esophageal reflux disease with esophagitis: Secondary | ICD-10-CM | POA: Diagnosis present

## 2014-01-17 DIAGNOSIS — K275 Chronic or unspecified peptic ulcer, site unspecified, with perforation: Secondary | ICD-10-CM

## 2014-01-17 DIAGNOSIS — Z9861 Coronary angioplasty status: Secondary | ICD-10-CM

## 2014-01-17 DIAGNOSIS — M810 Age-related osteoporosis without current pathological fracture: Secondary | ICD-10-CM | POA: Diagnosis present

## 2014-01-17 DIAGNOSIS — I2489 Other forms of acute ischemic heart disease: Secondary | ICD-10-CM

## 2014-01-17 DIAGNOSIS — E1165 Type 2 diabetes mellitus with hyperglycemia: Secondary | ICD-10-CM | POA: Diagnosis present

## 2014-01-17 DIAGNOSIS — E119 Type 2 diabetes mellitus without complications: Secondary | ICD-10-CM

## 2014-01-17 DIAGNOSIS — I482 Chronic atrial fibrillation, unspecified: Secondary | ICD-10-CM

## 2014-01-17 DIAGNOSIS — F32A Depression, unspecified: Secondary | ICD-10-CM

## 2014-01-17 DIAGNOSIS — R1110 Vomiting, unspecified: Secondary | ICD-10-CM | POA: Diagnosis not present

## 2014-01-17 DIAGNOSIS — E1169 Type 2 diabetes mellitus with other specified complication: Secondary | ICD-10-CM | POA: Diagnosis present

## 2014-01-17 DIAGNOSIS — K311 Adult hypertrophic pyloric stenosis: Principal | ICD-10-CM

## 2014-01-17 DIAGNOSIS — I739 Peripheral vascular disease, unspecified: Secondary | ICD-10-CM | POA: Diagnosis present

## 2014-01-17 DIAGNOSIS — R109 Unspecified abdominal pain: Secondary | ICD-10-CM | POA: Diagnosis not present

## 2014-01-17 DIAGNOSIS — E1122 Type 2 diabetes mellitus with diabetic chronic kidney disease: Secondary | ICD-10-CM | POA: Diagnosis present

## 2014-01-17 LAB — CBC WITH DIFFERENTIAL/PLATELET
Basophils Absolute: 0 10*3/uL (ref 0.0–0.1)
Basophils Relative: 0 % (ref 0–1)
Eosinophils Absolute: 0.9 10*3/uL — ABNORMAL HIGH (ref 0.0–0.7)
Eosinophils Relative: 13 % — ABNORMAL HIGH (ref 0–5)
HCT: 32.9 % — ABNORMAL LOW (ref 36.0–46.0)
Hemoglobin: 11.1 g/dL — ABNORMAL LOW (ref 12.0–15.0)
Lymphocytes Relative: 18 % (ref 12–46)
Lymphs Abs: 1.2 10*3/uL (ref 0.7–4.0)
MCH: 30.4 pg (ref 26.0–34.0)
MCHC: 33.7 g/dL (ref 30.0–36.0)
MCV: 90.1 fL (ref 78.0–100.0)
Monocytes Absolute: 1 10*3/uL (ref 0.1–1.0)
Monocytes Relative: 15 % — ABNORMAL HIGH (ref 3–12)
Neutro Abs: 3.8 10*3/uL (ref 1.7–7.7)
Neutrophils Relative %: 54 % (ref 43–77)
Platelets: 227 10*3/uL (ref 150–400)
RBC: 3.65 MIL/uL — ABNORMAL LOW (ref 3.87–5.11)
RDW: 15.8 % — ABNORMAL HIGH (ref 11.5–15.5)
WBC: 6.9 10*3/uL (ref 4.0–10.5)

## 2014-01-17 LAB — URINALYSIS, ROUTINE W REFLEX MICROSCOPIC
Bilirubin Urine: NEGATIVE
Glucose, UA: NEGATIVE mg/dL
Hgb urine dipstick: NEGATIVE
Ketones, ur: NEGATIVE mg/dL
Leukocytes, UA: NEGATIVE
Nitrite: NEGATIVE
Protein, ur: NEGATIVE mg/dL
Specific Gravity, Urine: 1.01 (ref 1.005–1.030)
Urobilinogen, UA: 0.2 mg/dL (ref 0.0–1.0)
pH: 8 (ref 5.0–8.0)

## 2014-01-17 LAB — LIPASE, BLOOD: Lipase: 91 U/L — ABNORMAL HIGH (ref 11–59)

## 2014-01-17 LAB — COMPREHENSIVE METABOLIC PANEL
ALT: 16 U/L (ref 0–35)
AST: 33 U/L (ref 0–37)
Albumin: 3.9 g/dL (ref 3.5–5.2)
Alkaline Phosphatase: 73 U/L (ref 39–117)
Anion gap: 12 (ref 5–15)
BUN: 26 mg/dL — ABNORMAL HIGH (ref 6–23)
CO2: 26 mEq/L (ref 19–32)
Calcium: 10.4 mg/dL (ref 8.4–10.5)
Chloride: 96 mEq/L (ref 96–112)
Creatinine, Ser: 1.37 mg/dL — ABNORMAL HIGH (ref 0.50–1.10)
GFR calc Af Amer: 39 mL/min — ABNORMAL LOW (ref >90)
GFR calc non Af Amer: 33 mL/min — ABNORMAL LOW (ref >90)
Glucose, Bld: 110 mg/dL — ABNORMAL HIGH (ref 70–99)
Potassium: 4.7 mEq/L (ref 3.7–5.3)
Sodium: 134 mEq/L — ABNORMAL LOW (ref 137–147)
Total Bilirubin: 0.4 mg/dL (ref 0.3–1.2)
Total Protein: 8 g/dL (ref 6.0–8.3)

## 2014-01-17 LAB — APTT: aPTT: 34 seconds (ref 24–37)

## 2014-01-17 LAB — PROTIME-INR
INR: 1.12 (ref 0.00–1.49)
Prothrombin Time: 14.6 seconds (ref 11.6–15.2)

## 2014-01-17 LAB — I-STAT TROPONIN, ED: Troponin i, poc: 0.02 ng/mL (ref 0.00–0.08)

## 2014-01-17 LAB — POC OCCULT BLOOD, ED: Fecal Occult Bld: NEGATIVE

## 2014-01-17 MED ORDER — IOHEXOL 300 MG/ML  SOLN
50.0000 mL | Freq: Once | INTRAMUSCULAR | Status: AC | PRN
  Administered 2014-01-17: 50 mL via ORAL

## 2014-01-17 MED ORDER — SODIUM CHLORIDE 0.9 % IV SOLN
INTRAVENOUS | Status: DC
  Administered 2014-01-17: 21:00:00 via INTRAVENOUS
  Administered 2014-01-18: 100 mL/h via INTRAVENOUS

## 2014-01-17 MED ORDER — IOHEXOL 300 MG/ML  SOLN
80.0000 mL | Freq: Once | INTRAMUSCULAR | Status: AC | PRN
  Administered 2014-01-17: 80 mL via INTRAVENOUS

## 2014-01-17 NOTE — ED Notes (Signed)
Pt arrived to the ED with a complaint of abdominal pain.  Pt states she feels as if she has gas pains.  Pt also states she has had episodes of emesis and diarrhea.  Pt states the symptoms have been present for three nights.  Pt states it started when she was constipated.  Pt took Milk of magnesia, miralax, and a suppository

## 2014-01-17 NOTE — ED Provider Notes (Signed)
CSN: [id number]     Arrival date & time 01/17/14  1945 History   First MD Initiated Contact with Patient 01/17/14 2001     Chief Complaint  Patient presents with  . Abdominal Pain  . Emesis  . Diarrhea     (Consider location/radiation/quality/duration/timing/severity/associated sxs/prior Treatment) HPI Three days of abdominal pain. Generalized, feels like gas. Comes and goes. Worse after eating. No particular foods. Last BM today, small ones on off through day. Feels constipated, tried MOM without relief. Vomited today twice shortly eating. Tried grits and sausage this morning. No fever. No dysuria or frequency.   Past Medical History  Diagnosis Date  . Pneumonia   . Thyroid disease   . Seizures   . Stroke   . Hyperlipidemia   . Hypertension   . Diabetes mellitus without complication   . Small bowel obstruction   . Ventral hernia with bowel obstruction    Past Surgical History  Procedure Laterality Date  . Exploratory laparotomy  09-07-2013  . Cholecystectomy    . Tubal ligation    . Colectomy    . Hernia repair  2015   Family History  Problem Relation Age of Onset  . Stroke Mother    History  Substance Use Topics  . Smoking status: Never Smoker   . Smokeless tobacco: Not on file  . Alcohol Use: No   OB History   Grav Para Term Preterm Abortions TAB SAB Ect Mult Living                 Review of Systems 10 Systems reviewed and are negative for acute change except as noted in the HPI.   Allergies  Review of patient's allergies indicates no known allergies.  Home Medications   Prior to Admission medications   Medication Sig Start Date End Date Taking? Authorizing Provider  acetaminophen (TYLENOL EX ST ARTHRITIS PAIN) 500 MG tablet Take 500 mg by mouth every 6 (six) hours as needed (for arthritis pain).   Yes Historical Provider, MD  aspirin EC 81 MG tablet Take 1 tablet (81 mg total) by mouth daily. 12/26/13  Yes Eugenie Filler, MD  atorvastatin  (LIPITOR) 10 MG tablet Take 10 mg by mouth daily at 6 PM.   Yes Historical Provider, MD  Calcium Carbonate (CALTRATE 600 PO) Take 2 capsules by mouth 2 (two) times daily.   Yes Historical Provider, MD  cycloSPORINE (RESTASIS) 0.05 % ophthalmic emulsion Place 1 drop into both eyes 2 (two) times daily.   Yes Historical Provider, MD  Dietary Management Product (VASCULERA) TABS Take 1 tablet by mouth daily.   Yes Historical Provider, MD  fluticasone (FLONASE) 50 MCG/ACT nasal spray Place 2 sprays into both nostrils daily.   Yes Historical Provider, MD  gabapentin (NEURONTIN) 300 MG capsule Take 300 mg by mouth 3 (three) times daily.   Yes Historical Provider, MD  hydrALAZINE (APRESOLINE) 50 MG tablet Take one tablet by mouth four times daily to control blood pressure 01/15/14  Yes Mahima Pandey, MD  isosorbide mononitrate (IMDUR) 30 MG 24 hr tablet Take 30 mg by mouth daily.   Yes Historical Provider, MD  l-methylfolate-B6-B12 Glade Stanford) 3-35-2 MG TABS Take one tablet by mouth once daily 01/09/14  Yes Mahima Pandey, MD  levothyroxine (SYNTHROID, LEVOTHROID) 175 MCG tablet Take 175 mcg by mouth daily before breakfast.   Yes Historical Provider, MD  losartan (COZAAR) 100 MG tablet Take 100 mg by mouth daily.   Yes Historical Provider, MD  magnesium oxide (  MAG-OX) 400 MG tablet Take one tablet by mouth twice daily 01/09/14  Yes Mahima Pandey, MD  metoprolol succinate (TOPROL-XL) 50 MG 24 hr tablet Take 50 mg by mouth daily. Take with or immediately following a meal.   Yes Historical Provider, MD  Multiple Vitamins-Minerals (MULTIVITAMIN & MINERAL PO) Take 1 tablet by mouth daily.   Yes Historical Provider, MD  Multiple Vitamins-Minerals (MULTIVITAMIN WITH MINERALS) tablet Take 1 tablet by mouth daily.   Yes Historical Provider, MD  pantoprazole (PROTONIX) 40 MG tablet Take 40 mg by mouth daily.   Yes Historical Provider, MD  sennosides-docusate sodium (SENOKOT-S) 8.6-50 MG tablet Take 1 tablet by mouth 2 (two)  times daily.    Yes Historical Provider, MD  sertraline (ZOLOFT) 100 MG tablet Take 100 mg by mouth daily.   Yes Historical Provider, MD  sucralfate (CARAFATE) 1 G tablet Take 1 g by mouth 3 (three) times daily with meals.   Yes Historical Provider, MD  traMADol (ULTRAM) 50 MG tablet Take 50 mg by mouth every 12 (twelve) hours as needed for severe pain (pain).   Yes Historical Provider, MD   BP 198/82  Pulse 63  Temp(Src) 98.2 F (36.8 C) (Oral)  Resp 16  SpO2 96% Physical Exam  Constitutional: She is oriented to person, place, and time. She appears well-developed and well-nourished.  HENT:  Head: Normocephalic and atraumatic.  Eyes: EOM are normal. Pupils are equal, round, and reactive to light.  Neck: Neck supple.  Cardiovascular: Normal rate, regular rhythm, normal heart sounds and intact distal pulses.   Pulmonary/Chest: Effort normal. No respiratory distress. She has wheezes (patient has bilateral fine expiratory wheeze with soft breath of the bases.).  Abdominal: She exhibits no distension. There is no tenderness.  Bowel sounds are hyperactive. Abdomen is distended. Patient has a well-healed central scar. Abdomen does feel moderately firm in the mid and upper abdomen. Patient expresses some moderate discomfort with palpation. No guarding.  Genitourinary:  Rectal examination the rectal vault is empty. There is only trace greenish stool present. No impaction no blood.  Musculoskeletal: Normal range of motion. She exhibits no edema.  Neurological: She is alert and oriented to person, place, and time. She has normal strength. Coordination normal. GCS eye subscore is 4. GCS verbal subscore is 5. GCS motor subscore is 6.  Skin: Skin is warm, dry and intact.  Psychiatric: She has a normal mood and affect.    ED Course  Procedures (including critical care time) Labs Review Labs Reviewed  COMPREHENSIVE METABOLIC PANEL - Abnormal; Notable for the following:    Sodium 134 (*)     Glucose, Bld 110 (*)    BUN 26 (*)    Creatinine, Ser 1.37 (*)    GFR calc non Af Amer 33 (*)    GFR calc Af Amer 39 (*)    All other components within normal limits  LIPASE, BLOOD - Abnormal; Notable for the following:    Lipase 91 (*)    All other components within normal limits  CBC WITH DIFFERENTIAL - Abnormal; Notable for the following:    RBC 3.65 (*)    Hemoglobin 11.1 (*)    HCT 32.9 (*)    RDW 15.8 (*)    Monocytes Relative 15 (*)    Eosinophils Relative 13 (*)    Eosinophils Absolute 0.9 (*)    All other components within normal limits  APTT  PROTIME-INR  URINALYSIS, ROUTINE W REFLEX MICROSCOPIC  PRO B NATRIURETIC PEPTIDE  I-STAT  [place], ED  POC OCCULT BLOOD, ED    Imaging Review Ct Abdomen Pelvis W Contrast  01/17/2014   CLINICAL DATA:  Vomiting. Abdominal pain. History of small bowel obstruction.  EXAM: CT ABDOMEN AND PELVIS WITH CONTRAST  TECHNIQUE: Multidetector CT imaging of the abdomen and pelvis was performed using the standard protocol following bolus administration of intravenous contrast.  CONTRAST:  67mL OMNIPAQUE IOHEXOL 300 MG/ML SOLN, 19mL OMNIPAQUE IOHEXOL 300 MG/ML SOLN  COMPARISON:  None.  FINDINGS: BODY WALL: Unremarkable.  LOWER CHEST: Cardiomegaly. Gastroesophageal reflux in the setting of gastric outlet obstruction.  ABDOMEN/PELVIS:  Liver: Numerous well circumscribed low-density lesions most consistent with cysts. No indication of malignancy history in the electronic medical record.  Biliary: Cholecystectomy.  Pancreas: Atrophy without acute finding.  Spleen: Incidental sub cm cyst or other lesion in the anterior subcapsular spleen.  Adrenals: Unremarkable.  Kidneys and ureters: Lobulation of both renal cortices consistent with scarring. Bilateral renal atrophy. No hydronephrosis.  Bladder: Unremarkable.  Reproductive: Unremarkable for age.  The pelvic floor appears low.  Bowel: The stomach is massively dilated to the level of the pylorus which is  circumferentially thickened (single wall thickness of 15 mm). The mucosa is distinct at the level of the pylorus, with submucosal low-density favoring inflammatory edema. No evidence for perforation or pneumatosis. There is been in enteroenterostomy which is patent. This may be related to electronic medical record history of small bowel obstruction. Sutures noted along the anterior gastric body, likely related to patient's history perforated gastric ulcer. Fluid levels present in the distal colon consistent with diarrhea. Colonic diverticulosis.  Retroperitoneum: No mass or adenopathy.  Peritoneum: No ascites or pneumoperitoneum.  Vascular: No acute abnormality.  OSSEOUS: No acute abnormalities. Remote appearing T11 compression fracture with approximate 50% height loss.  IMPRESSION: 1. High-grade gastric outlet obstruction with pylorus appearance favoring peptic ulcer disease over mass. Follow-up endoscopy or upper GI suggested. 2. Numerous non acute findings are noted above.   Electronically Signed   By: Jorje Guild M.D.   On: 01/17/2014 22:54     EKG Interpretation   Date/Time:  Saturday January 17 2014 21:13:34 EDT Ventricular Rate:  59 PR Interval:  180 QRS Duration: 76 QT Interval:  406 QTC Calculation: 402 R Axis:   76 Text Interpretation:  Sinus rhythm Probable left atrial enlargement  Borderline T abnormalities, diffuse leads agree. No STEMI Confirmed by  Johnney Killian, MD, Jeannie Done [phone]) on 01/17/2014 11:19:04 PM     Consult general surgery Dr. Excell Seltzer, at this time patient will be admitted to internal medicine with NG tube and plan for GI consult as well. MDM   Final diagnoses:  Gastric outlet obstruction   Patient presents as outlined above with symptoms very consistent with her findings of gastric outlet obstruction a CT. The patient is nontoxic and does not have any guarding and her abdominal examination. At this point she will be admitted as outlined above.    Charlesetta Shanks,  MD 01/18/14 [phone]

## 2014-01-18 ENCOUNTER — Encounter (HOSPITAL_COMMUNITY): Payer: Self-pay | Admitting: Physician Assistant

## 2014-01-18 DIAGNOSIS — K311 Adult hypertrophic pyloric stenosis: Secondary | ICD-10-CM | POA: Diagnosis not present

## 2014-01-18 DIAGNOSIS — Z9861 Coronary angioplasty status: Secondary | ICD-10-CM | POA: Diagnosis not present

## 2014-01-18 DIAGNOSIS — I248 Other forms of acute ischemic heart disease: Secondary | ICD-10-CM | POA: Diagnosis not present

## 2014-01-18 DIAGNOSIS — Z8673 Personal history of transient ischemic attack (TIA), and cerebral infarction without residual deficits: Secondary | ICD-10-CM | POA: Diagnosis not present

## 2014-01-18 DIAGNOSIS — I739 Peripheral vascular disease, unspecified: Secondary | ICD-10-CM | POA: Diagnosis present

## 2014-01-18 DIAGNOSIS — E860 Dehydration: Secondary | ICD-10-CM | POA: Diagnosis present

## 2014-01-18 DIAGNOSIS — E1165 Type 2 diabetes mellitus with hyperglycemia: Secondary | ICD-10-CM | POA: Diagnosis not present

## 2014-01-18 DIAGNOSIS — E871 Hypo-osmolality and hyponatremia: Secondary | ICD-10-CM | POA: Diagnosis present

## 2014-01-18 DIAGNOSIS — Z823 Family history of stroke: Secondary | ICD-10-CM | POA: Diagnosis not present

## 2014-01-18 DIAGNOSIS — T182XXD Foreign body in stomach, subsequent encounter: Secondary | ICD-10-CM | POA: Diagnosis not present

## 2014-01-18 DIAGNOSIS — K3184 Gastroparesis: Secondary | ICD-10-CM | POA: Diagnosis present

## 2014-01-18 DIAGNOSIS — Z90710 Acquired absence of both cervix and uterus: Secondary | ICD-10-CM | POA: Diagnosis not present

## 2014-01-18 DIAGNOSIS — E1143 Type 2 diabetes mellitus with diabetic autonomic (poly)neuropathy: Secondary | ICD-10-CM | POA: Diagnosis not present

## 2014-01-18 DIAGNOSIS — I973 Postprocedural hypertension: Secondary | ICD-10-CM | POA: Diagnosis present

## 2014-01-18 DIAGNOSIS — D649 Anemia, unspecified: Secondary | ICD-10-CM

## 2014-01-18 DIAGNOSIS — N179 Acute kidney failure, unspecified: Secondary | ICD-10-CM | POA: Diagnosis not present

## 2014-01-18 DIAGNOSIS — E785 Hyperlipidemia, unspecified: Secondary | ICD-10-CM | POA: Diagnosis present

## 2014-01-18 DIAGNOSIS — E1159 Type 2 diabetes mellitus with other circulatory complications: Secondary | ICD-10-CM | POA: Diagnosis not present

## 2014-01-18 DIAGNOSIS — R109 Unspecified abdominal pain: Secondary | ICD-10-CM | POA: Diagnosis not present

## 2014-01-18 DIAGNOSIS — K222 Esophageal obstruction: Secondary | ICD-10-CM | POA: Diagnosis present

## 2014-01-18 DIAGNOSIS — I639 Cerebral infarction, unspecified: Secondary | ICD-10-CM

## 2014-01-18 DIAGNOSIS — D638 Anemia in other chronic diseases classified elsewhere: Secondary | ICD-10-CM | POA: Diagnosis present

## 2014-01-18 DIAGNOSIS — N183 Chronic kidney disease, stage 3 unspecified: Secondary | ICD-10-CM | POA: Diagnosis present

## 2014-01-18 DIAGNOSIS — Z8711 Personal history of peptic ulcer disease: Secondary | ICD-10-CM | POA: Diagnosis not present

## 2014-01-18 DIAGNOSIS — R0789 Other chest pain: Secondary | ICD-10-CM | POA: Diagnosis not present

## 2014-01-18 DIAGNOSIS — T182XXA Foreign body in stomach, initial encounter: Secondary | ICD-10-CM | POA: Diagnosis not present

## 2014-01-18 DIAGNOSIS — I10 Essential (primary) hypertension: Secondary | ICD-10-CM | POA: Diagnosis not present

## 2014-01-18 DIAGNOSIS — F329 Major depressive disorder, single episode, unspecified: Secondary | ICD-10-CM | POA: Diagnosis present

## 2014-01-18 DIAGNOSIS — I129 Hypertensive chronic kidney disease with stage 1 through stage 4 chronic kidney disease, or unspecified chronic kidney disease: Secondary | ICD-10-CM | POA: Diagnosis present

## 2014-01-18 DIAGNOSIS — E039 Hypothyroidism, unspecified: Secondary | ICD-10-CM

## 2014-01-18 DIAGNOSIS — I2510 Atherosclerotic heart disease of native coronary artery without angina pectoris: Secondary | ICD-10-CM | POA: Diagnosis present

## 2014-01-18 DIAGNOSIS — Z9049 Acquired absence of other specified parts of digestive tract: Secondary | ICD-10-CM | POA: Diagnosis present

## 2014-01-18 DIAGNOSIS — M810 Age-related osteoporosis without current pathological fracture: Secondary | ICD-10-CM | POA: Diagnosis present

## 2014-01-18 DIAGNOSIS — K210 Gastro-esophageal reflux disease with esophagitis: Secondary | ICD-10-CM | POA: Diagnosis present

## 2014-01-18 DIAGNOSIS — E1140 Type 2 diabetes mellitus with diabetic neuropathy, unspecified: Secondary | ICD-10-CM | POA: Diagnosis present

## 2014-01-18 LAB — GLUCOSE, CAPILLARY
Glucose-Capillary: 141 mg/dL — ABNORMAL HIGH (ref 70–99)
Glucose-Capillary: 100 mg/dL — ABNORMAL HIGH (ref 70–99)
Glucose-Capillary: 104 mg/dL — ABNORMAL HIGH (ref 70–99)
Glucose-Capillary: 93 mg/dL (ref 70–99)
Glucose-Capillary: 78 mg/dL (ref 70–99)
Glucose-Capillary: 81 mg/dL (ref 70–99)

## 2014-01-18 LAB — PRO B NATRIURETIC PEPTIDE: Pro B Natriuretic peptide (BNP): 961.6 pg/mL — ABNORMAL HIGH (ref 0–450)

## 2014-01-18 MED ORDER — CLONIDINE HCL 0.1 MG PO TABS
0.1000 mg | ORAL_TABLET | Freq: Four times a day (QID) | ORAL | Status: DC | PRN
  Filled 2014-01-18: qty 1

## 2014-01-18 MED ORDER — ONDANSETRON HCL 4 MG/2ML IJ SOLN
4.0000 mg | Freq: Four times a day (QID) | INTRAMUSCULAR | Status: DC | PRN
  Administered 2014-01-18: 4 mg via INTRAVENOUS
  Filled 2014-01-18: qty 2

## 2014-01-18 MED ORDER — CHLORHEXIDINE GLUCONATE 0.12 % MT SOLN
15.0000 mL | Freq: Two times a day (BID) | OROMUCOSAL | Status: DC
  Administered 2014-01-18 – 2014-01-22 (×7): 15 mL via OROMUCOSAL
  Filled 2014-01-18 (×11): qty 15

## 2014-01-18 MED ORDER — SODIUM CHLORIDE 0.9 % IV SOLN
INTRAVENOUS | Status: DC
  Administered 2014-01-18: 23:00:00 via INTRAVENOUS
  Administered 2014-01-18: 75 mL/h via INTRAVENOUS
  Administered 2014-01-19: 75 mL via INTRAVENOUS
  Administered 2014-01-19 – 2014-01-21 (×3): via INTRAVENOUS

## 2014-01-18 MED ORDER — INSULIN ASPART 100 UNIT/ML ~~LOC~~ SOLN
0.0000 [IU] | SUBCUTANEOUS | Status: DC
  Administered 2014-01-18 – 2014-01-20 (×4): 1 [IU] via SUBCUTANEOUS
  Administered 2014-01-20: 2 [IU] via SUBCUTANEOUS
  Administered 2014-01-21: 1 [IU] via SUBCUTANEOUS

## 2014-01-18 MED ORDER — LORAZEPAM 2 MG/ML IJ SOLN: 0.5000 mg | Freq: Four times a day (QID) | INTRAMUSCULAR | Status: DC | PRN

## 2014-01-18 MED ORDER — CYCLOSPORINE 0.05 % OP EMUL
1.0000 [drp] | Freq: Two times a day (BID) | OPHTHALMIC | Status: DC
  Administered 2014-01-18 – 2014-01-22 (×8): 1 [drp] via OPHTHALMIC
  Filled 2014-01-18 (×12): qty 1

## 2014-01-18 MED ORDER — ENOXAPARIN SODIUM 40 MG/0.4ML ~~LOC~~ SOLN: 40.0000 mg | SUBCUTANEOUS | Status: DC

## 2014-01-18 MED ORDER — ACETAMINOPHEN 325 MG PO TABS
650.0000 mg | ORAL_TABLET | Freq: Four times a day (QID) | ORAL | Status: DC | PRN
Start: 2014-01-18 — End: 2014-01-22

## 2014-01-18 MED ORDER — CETYLPYRIDINIUM CHLORIDE 0.05 % MT LIQD
7.0000 mL | Freq: Two times a day (BID) | OROMUCOSAL | Status: DC
  Administered 2014-01-21 – 2014-01-22 (×3): 7 mL via OROMUCOSAL

## 2014-01-18 MED ORDER — ISOSORBIDE MONONITRATE ER 30 MG PO TB24
30.0000 mg | ORAL_TABLET | Freq: Every day | ORAL | Status: DC
  Filled 2014-01-18: qty 1

## 2014-01-18 MED ORDER — HYDRALAZINE HCL 20 MG/ML IJ SOLN
10.0000 mg | Freq: Four times a day (QID) | INTRAMUSCULAR | Status: DC
  Administered 2014-01-18 (×3): 10 mg via INTRAVENOUS
  Administered 2014-01-19: 5 mg via INTRAVENOUS
  Administered 2014-01-19 – 2014-01-20 (×5): 10 mg via INTRAVENOUS
  Administered 2014-01-20: 20 mg via INTRAVENOUS
  Filled 2014-01-18 (×2): qty 0.5
  Filled 2014-01-18 (×2): qty 1
  Filled 2014-01-18 (×3): qty 0.5
  Filled 2014-01-18 (×2): qty 1
  Filled 2014-01-18: qty 0.5
  Filled 2014-01-18: qty 1
  Filled 2014-01-18 (×2): qty 0.5

## 2014-01-18 MED ORDER — HYDROMORPHONE HCL 1 MG/ML IJ SOLN
0.5000 mg | INTRAMUSCULAR | Status: DC | PRN
  Administered 2014-01-21: 0.5 mg via INTRAVENOUS
  Filled 2014-01-18: qty 1

## 2014-01-18 MED ORDER — L-METHYLFOLATE-B6-B12 3-35-2 MG PO TABS
1.0000 | ORAL_TABLET | Freq: Every day | ORAL | Status: DC
  Filled 2014-01-18: qty 1

## 2014-01-18 MED ORDER — HYDRALAZINE HCL 20 MG/ML IJ SOLN
20.0000 mg | Freq: Once | INTRAMUSCULAR | Status: AC
  Administered 2014-01-18: 20 mg via INTRAVENOUS
  Filled 2014-01-18: qty 1

## 2014-01-18 MED ORDER — ONDANSETRON HCL 4 MG/2ML IJ SOLN
4.0000 mg | Freq: Four times a day (QID) | INTRAMUSCULAR | Status: DC | PRN
  Administered 2014-01-20: 4 mg via INTRAVENOUS
  Filled 2014-01-18: qty 2

## 2014-01-18 MED ORDER — LOSARTAN POTASSIUM 50 MG PO TABS
100.0000 mg | ORAL_TABLET | Freq: Every day | ORAL | Status: DC
  Filled 2014-01-18: qty 2

## 2014-01-18 MED ORDER — ASPIRIN EC 81 MG PO TBEC
81.0000 mg | DELAYED_RELEASE_TABLET | Freq: Every day | ORAL | Status: DC
  Administered 2014-01-18 – 2014-01-22 (×4): 81 mg via ORAL
  Filled 2014-01-18 (×5): qty 1

## 2014-01-18 MED ORDER — SERTRALINE HCL 100 MG PO TABS
100.0000 mg | ORAL_TABLET | Freq: Every day | ORAL | Status: DC
  Administered 2014-01-18: 100 mg via ORAL
  Filled 2014-01-18: qty 1

## 2014-01-18 MED ORDER — SUCRALFATE 1 G PO TABS
1.0000 g | ORAL_TABLET | Freq: Three times a day (TID) | ORAL | Status: DC
  Filled 2014-01-18 (×4): qty 1

## 2014-01-18 MED ORDER — PANTOPRAZOLE SODIUM 40 MG IV SOLR
40.0000 mg | Freq: Two times a day (BID) | INTRAVENOUS | Status: DC
  Administered 2014-01-21 – 2014-01-22 (×3): 40 mg via INTRAVENOUS
  Filled 2014-01-18 (×3): qty 40

## 2014-01-18 MED ORDER — ACETAMINOPHEN 650 MG RE SUPP: 650.0000 mg | Freq: Four times a day (QID) | RECTAL | Status: DC | PRN

## 2014-01-18 MED ORDER — METOPROLOL SUCCINATE ER 50 MG PO TB24
50.0000 mg | ORAL_TABLET | Freq: Every day | ORAL | Status: DC
  Filled 2014-01-18: qty 1

## 2014-01-18 MED ORDER — SODIUM CHLORIDE 0.9 % IV SOLN
8.0000 mg/h | INTRAVENOUS | Status: AC
  Administered 2014-01-18 – 2014-01-20 (×6): 8 mg/h via INTRAVENOUS
  Filled 2014-01-18 (×15): qty 80

## 2014-01-18 MED ORDER — ENOXAPARIN SODIUM 30 MG/0.3ML ~~LOC~~ SOLN
30.0000 mg | Freq: Every day | SUBCUTANEOUS | Status: DC
  Administered 2014-01-18: 30 mg via SUBCUTANEOUS
  Filled 2014-01-18 (×2): qty 0.3

## 2014-01-18 MED ORDER — SODIUM CHLORIDE 0.9 % IV SOLN: INTRAVENOUS | Status: DC

## 2014-01-18 MED ORDER — METOPROLOL TARTRATE 1 MG/ML IV SOLN
2.5000 mg | Freq: Three times a day (TID) | INTRAVENOUS | Status: DC
  Administered 2014-01-18 – 2014-01-20 (×6): 2.5 mg via INTRAVENOUS
  Filled 2014-01-18 (×9): qty 5

## 2014-01-18 MED ORDER — PANTOPRAZOLE SODIUM 40 MG IV SOLR
80.0000 mg | Freq: Once | INTRAVENOUS | Status: AC
  Administered 2014-01-18: 80 mg via INTRAVENOUS
  Filled 2014-01-18: qty 80

## 2014-01-18 MED ORDER — HYDRALAZINE HCL 50 MG PO TABS
50.0000 mg | ORAL_TABLET | Freq: Four times a day (QID) | ORAL | Status: DC
  Filled 2014-01-18 (×6): qty 1

## 2014-01-18 MED ORDER — LEVOTHYROXINE SODIUM 100 MCG IV SOLR
87.5000 ug | Freq: Every day | INTRAVENOUS | Status: DC
  Administered 2014-01-18 – 2014-01-19 (×2): 87.5 ug via INTRAVENOUS
  Administered 2014-01-20: 100 ug via INTRAVENOUS
  Filled 2014-01-18 (×3): qty 5

## 2014-01-18 MED ORDER — LEVOTHYROXINE SODIUM 175 MCG PO TABS
175.0000 ug | ORAL_TABLET | Freq: Every day | ORAL | Status: DC
  Filled 2014-01-18 (×2): qty 1

## 2014-01-18 MED ORDER — GABAPENTIN 300 MG PO CAPS
300.0000 mg | ORAL_CAPSULE | Freq: Three times a day (TID) | ORAL | Status: DC
Start: 2014-01-18 — End: 2014-01-18
  Filled 2014-01-18 (×3): qty 1

## 2014-01-18 MED ORDER — FLUTICASONE PROPIONATE 50 MCG/ACT NA SUSP
2.0000 | Freq: Every day | NASAL | Status: DC
  Administered 2014-01-18 – 2014-01-22 (×4): 2 via NASAL
  Filled 2014-01-18 (×4): qty 16

## 2014-01-18 MED ORDER — ATORVASTATIN CALCIUM 10 MG PO TABS
10.0000 mg | ORAL_TABLET | Freq: Every day | ORAL | Status: DC
  Filled 2014-01-18: qty 1

## 2014-01-18 MED ORDER — ONDANSETRON HCL 4 MG/2ML IJ SOLN: 4.0000 mg | Freq: Three times a day (TID) | INTRAMUSCULAR | Status: DC | PRN

## 2014-01-18 NOTE — Progress Notes (Signed)
Rx Brief note:  Lovenox adjustment Pt with CrCl~28.9 ml/min ordered Lovenox 40mg  daily for DVT prophylaxis Rx adjusted Lovenox to 30mg  daily in pt with CrCl<30 ml/min  Dorrene German 01/18/2014 1:58 AM

## 2014-01-18 NOTE — Consult Note (Signed)
Reason for Consult: Nausea and vomiting, gastric outlet obstruction  Referring Physician: EDP  Shannon [name] is an 80 y.o. female.   HPI: Asked to see this patient who presents to the emergency department with persistent nausea and vomiting. She has a significant history of laparotomy at [facility] in June of this year. History obtained from the patient and her daughter were not entirely clear on the procedure but it seems that she had an incarcerated ventral hernia and also was found to have an "ulcer" at the same time. She was discharged to a nursing facility and then has been back at home with her daughter. She has had gradually decreasing appetite for about a week and then 2 days of persistent vomiting after trying to eat. Her abdomen feels tight and swollen but no severe pain. Limited bowel movements.  Past Medical History  Diagnosis Date  . Pneumonia   . Thyroid disease   . Seizures   . Stroke   . Hyperlipidemia   . Hypertension   . Diabetes mellitus without complication   . Small bowel obstruction   . Ventral hernia with bowel obstruction     Past Surgical History  Procedure Laterality Date  . Exploratory laparotomy  09-07-2013  . Cholecystectomy    . Tubal ligation    . Colectomy    . Hernia repair  2015    Family History  Problem Relation Age of Onset  . Stroke Mother     Social History:  reports that she has never smoked. She does not have any smokeless tobacco history on file. She reports that she does not drink alcohol or use illicit drugs.  Allergies: No Known Allergies   Current Facility-Administered Medications  Medication Dose Route Frequency Provider Last Rate Last Dose  . 0.9 %  sodium chloride infusion   Intravenous Continuous Charlesetta Shanks, MD 100 mL/hr at 01/18/14 0111 100 mL/hr at 01/18/14 0111  . 0.9 %  sodium chloride infusion   Intravenous STAT Charlesetta Shanks, MD      . 0.9 %  sodium chloride infusion   Intravenous Continuous Jani Gravel, MD      .  acetaminophen (TYLENOL) tablet 650 mg  650 mg Oral Q6H PRN Jani Gravel, MD       Or  . acetaminophen (TYLENOL) suppository 650 mg  650 mg Rectal Q6H PRN Jani Gravel, MD      . antiseptic oral rinse (CPC / CETYLPYRIDINIUM CHLORIDE 0.05%) solution 7 mL  7 mL Mouth Rinse q12n4p Jani Gravel, MD      . aspirin EC tablet 81 mg  81 mg Oral Daily Jani Gravel, MD      . atorvastatin (LIPITOR) tablet 10 mg  10 mg Oral q1800 Jani Gravel, MD      . chlorhexidine (PERIDEX) 0.12 % solution 15 mL  15 mL Mouth Rinse BID Jani Gravel, MD      . cloNIDine (CATAPRES) tablet 0.1 mg  0.1 mg Oral Q6H PRN Jani Gravel, MD      . cycloSPORINE (RESTASIS) 0.05 % ophthalmic emulsion 1 drop  1 drop Both Eyes BID Jani Gravel, MD      . enoxaparin (LOVENOX) injection 30 mg  30 mg Subcutaneous Daily Jani Gravel, MD      . fluticasone (FLONASE) 50 MCG/ACT nasal spray 2 spray  2 spray Each Nare Daily Jani Gravel, MD      . gabapentin (NEURONTIN) capsule 300 mg  300 mg Oral TID Jani Gravel, MD      .  hydrALAZINE (APRESOLINE) tablet 50 mg  50 mg Oral 4 times per day Jani Gravel, MD      . HYDROmorphone (DILAUDID) injection 0.5 mg  0.5 mg Intravenous Q4H PRN Jani Gravel, MD      . isosorbide mononitrate (IMDUR) 24 hr tablet 30 mg  30 mg Oral Daily Jani Gravel, MD      . l-methylfolate-B6-B12 [facility]) 3-35-2 MG per tablet 1 tablet  1 tablet Oral Daily Jani Gravel, MD      . levothyroxine (SYNTHROID, LEVOTHROID) tablet 175 mcg  175 mcg Oral QAC breakfast Jani Gravel, MD      . losartan (COZAAR) tablet 100 mg  100 mg Oral Daily Jani Gravel, MD      . metoprolol succinate (TOPROL-XL) 24 hr tablet 50 mg  50 mg Oral Daily Jani Gravel, MD      . ondansetron [facility]) injection 4 mg  4 mg Intravenous Q8H PRN Charlesetta Shanks, MD      . ondansetron (ZOFRAN) injection 4 mg  4 mg Intravenous Q6H PRN Jani Gravel, MD      . pantoprazole (PROTONIX) 80 mg in sodium chloride 0.9 % 100 mL IVPB  80 mg Intravenous Once Jani Gravel, MD      . pantoprazole (PROTONIX) 80 mg in sodium chloride  0.9 % 250 mL (0.32 mg/mL) infusion  8 mg/hr Intravenous Continuous Jani Gravel, MD      . Derrill Memo ON 01/21/2014] pantoprazole (PROTONIX) injection 40 mg  40 mg Intravenous Q12H Jani Gravel, MD      . sertraline (ZOLOFT) tablet 100 mg  100 mg Oral Daily Jani Gravel, MD      . sucralfate (CARAFATE) tablet 1 g  1 g Oral TID WC Jani Gravel, MD        Results for orders placed during the hospital encounter of 01/17/14 (from the past 48 hour(s))  COMPREHENSIVE METABOLIC PANEL     Status: Abnormal   Collection Time    01/17/14  9:20 PM      Result Value Ref Range   Sodium 134 (*) 137 - 147 mEq/L   Potassium 4.7  3.7 - 5.3 mEq/L   Chloride 96  96 - 112 mEq/L   CO2 26  19 - 32 mEq/L   Glucose, Bld 110 (*) 70 - 99 mg/dL   BUN 26 (*) 6 - 23 mg/dL   Creatinine, Ser 1.37 (*) 0.50 - 1.10 mg/dL   Calcium 10.4  8.4 - 10.5 mg/dL   Total Protein 8.0  6.0 - 8.3 g/dL   Albumin 3.9  3.5 - 5.2 g/dL   AST 33  0 - 37 U/L   Comment: SLIGHT HEMOLYSIS     HEMOLYSIS AT THIS LEVEL MAY AFFECT RESULT   ALT 16  0 - 35 U/L   Alkaline Phosphatase 73  39 - 117 U/L   Total Bilirubin 0.4  0.3 - 1.2 mg/dL   GFR calc non Af Amer 33 (*) >90 mL/min   GFR calc Af Amer 39 (*) >90 mL/min   Comment: (NOTE)     The eGFR has been calculated using the CKD EPI equation.     This calculation has not been validated in all clinical situations.     eGFR's persistently <90 mL/min signify possible Chronic Kidney     Disease.   Anion gap 12  5 - 15  LIPASE, BLOOD     Status: Abnormal   Collection Time    01/17/14  9:20 PM  Result Value Ref Range   Lipase 91 (*) 11 - 59 U/L  CBC WITH DIFFERENTIAL     Status: Abnormal   Collection Time    01/17/14  9:20 PM      Result Value Ref Range   WBC 6.9  4.0 - 10.5 K/uL   RBC 3.65 (*) 3.87 - 5.11 MIL/uL   Hemoglobin 11.1 (*) 12.0 - 15.0 g/dL   HCT 32.9 (*) 36.0 - 46.0 %   MCV 90.1  78.0 - 100.0 fL   MCH 30.4  26.0 - 34.0 pg   MCHC 33.7  30.0 - 36.0 g/dL   RDW 15.8 (*) 11.5 - 15.5 %    Platelets 227  150 - 400 K/uL   Neutrophils Relative % 54  43 - 77 %   Neutro Abs 3.8  1.7 - 7.7 K/uL   Lymphocytes Relative 18  12 - 46 %   Lymphs Abs 1.2  0.7 - 4.0 K/uL   Monocytes Relative 15 (*) 3 - 12 %   Monocytes Absolute 1.0  0.1 - 1.0 K/uL   Eosinophils Relative 13 (*) 0 - 5 %   Eosinophils Absolute 0.9 (*) 0.0 - 0.7 K/uL   Basophils Relative 0  0 - 1 %   Basophils Absolute 0.0  0.0 - 0.1 K/uL  APTT     Status: None   Collection Time    01/17/14  9:20 PM      Result Value Ref Range   aPTT 34  24 - 37 seconds  PROTIME-INR     Status: None   Collection Time    01/17/14  9:20 PM      Result Value Ref Range   Prothrombin Time 14.6  11.6 - 15.2 seconds   INR 1.12  0.00 - 1.49  URINALYSIS, ROUTINE W REFLEX MICROSCOPIC     Status: None   Collection Time    01/17/14  9:30 PM      Result Value Ref Range   Color, Urine YELLOW  YELLOW   APPearance CLEAR  CLEAR   Specific Gravity, Urine 1.010  1.005 - 1.030   pH 8.0  5.0 - 8.0   Glucose, UA NEGATIVE  NEGATIVE mg/dL   Hgb urine dipstick NEGATIVE  NEGATIVE   Bilirubin Urine NEGATIVE  NEGATIVE   Ketones, ur NEGATIVE  NEGATIVE mg/dL   Protein, ur NEGATIVE  NEGATIVE mg/dL   Urobilinogen, UA 0.2  0.0 - 1.0 mg/dL   Nitrite NEGATIVE  NEGATIVE   Leukocytes, UA NEGATIVE  NEGATIVE   Comment: MICROSCOPIC NOT DONE ON URINES WITH NEGATIVE PROTEIN, BLOOD, LEUKOCYTES, NITRITE, OR GLUCOSE <1000 mg/dL.  Randolm Idol, ED     Status: None   Collection Time    01/17/14  9:31 PM      Result Value Ref Range   Troponin i, poc 0.02  0.00 - 0.08 ng/mL   Comment 3            Comment: Due to the release kinetics of cTnI,     a negative result within the first hours     of the onset of symptoms does not rule out     myocardial infarction with certainty.     If myocardial infarction is still suspected,     repeat the test at appropriate intervals.  POC OCCULT BLOOD, ED     Status: None   Collection Time    01/17/14  9:36 PM      Result  Value Ref Range  Fecal Occult Bld NEGATIVE  NEGATIVE    Ct Abdomen Pelvis W Contrast  01/17/2014   CLINICAL DATA:  Vomiting. Abdominal pain. History of small bowel obstruction.  EXAM: CT ABDOMEN AND PELVIS WITH CONTRAST  TECHNIQUE: Multidetector CT imaging of the abdomen and pelvis was performed using the standard protocol following bolus administration of intravenous contrast.  CONTRAST:  58m OMNIPAQUE IOHEXOL 300 MG/ML SOLN, 837mOMNIPAQUE IOHEXOL 300 MG/ML SOLN  COMPARISON:  None.  FINDINGS: BODY WALL: Unremarkable.  LOWER CHEST: Cardiomegaly. Gastroesophageal reflux in the setting of gastric outlet obstruction.  ABDOMEN/PELVIS:  Liver: Numerous well circumscribed low-density lesions most consistent with cysts. No indication of malignancy history in the electronic medical record.  Biliary: Cholecystectomy.  Pancreas: Atrophy without acute finding.  Spleen: Incidental sub cm cyst or other lesion in the anterior subcapsular spleen.  Adrenals: Unremarkable.  Kidneys and ureters: Lobulation of both renal cortices consistent with scarring. Bilateral renal atrophy. No hydronephrosis.  Bladder: Unremarkable.  Reproductive: Unremarkable for age.  The pelvic floor appears low.  Bowel: The stomach is massively dilated to the level of the pylorus which is circumferentially thickened (single wall thickness of 15 mm). The mucosa is distinct at the level of the pylorus, with submucosal low-density favoring inflammatory edema. No evidence for perforation or pneumatosis. There is been in enteroenterostomy which is patent. This may be related to electronic medical record history of small bowel obstruction. Sutures noted along the anterior gastric body, likely related to patient's history perforated gastric ulcer. Fluid levels present in the distal colon consistent with diarrhea. Colonic diverticulosis.  Retroperitoneum: No mass or adenopathy.  Peritoneum: No ascites or pneumoperitoneum.  Vascular: No acute abnormality.   OSSEOUS: No acute abnormalities. Remote appearing T11 compression fracture with approximate 50% height loss.  IMPRESSION: 1. High-grade gastric outlet obstruction with pylorus appearance favoring peptic ulcer disease over mass. Follow-up endoscopy or upper GI suggested. 2. Numerous non acute findings are noted above.   Electronically Signed   By: JoJorje Guild.D.   On: 01/17/2014 22:54    Review of Systems  Constitutional: Negative for fever and chills.  Respiratory: Negative.   Cardiovascular: Negative for chest pain and palpitations.  Gastrointestinal: Positive for nausea and vomiting. Negative for abdominal pain and blood in stool.  Genitourinary: Negative.    Blood pressure 150/59, pulse 72, temperature 98 F (36.7 C), temperature source Oral, resp. rate 18, height '5\' 6"'  (1.676 m), weight 158 lb 12.8 oz (72.031 kg), SpO2 96.00%. Physical Exam General: Alert, Elderly African American female, in no distress Skin: Warm and dry without rash or infection. HEENT: No palpable masses or thyromegaly. Sclera nonicteric. Pupils equal round and reactive. Oropharynx clear. Lymph nodes: No cervical, supraclavicular, or inguinal nodes palpable. Lungs: Breath sounds clear and equal without increased work of breathing Cardiovascular:Your regular rhythm without murmur. No JVD, 1+ edema lower extremities.  Abdomen: Distended. Mild diffuse tenderness. No guarding. Healed midline incision without apparent hernias.. Extremities: 1+ edema or joint swelling or deformity. No chronic venous stasis changes. Neurologic: Alert and fully oriented. No gross motor deficits  Assessment/Plan: Gastric outlet obstruction. Likely secondary to peptic ulcer disease with no mass seen on CT scan. Neoplasm possible. Initially would recommend NG decompression of the stomach and upper endoscopy to evaluate. [facility] and findings were there Protonix every 12  hours   Sayan Aldava T 01/18/2014, 2:08 AM

## 2014-01-18 NOTE — Consult Note (Signed)
[facility] Consult: 9:19 AM 01/18/2014  LOS: 1 day    Referring Provider: Dr Maudie Mercury   Primary Care Physician:  Blanchie Serve, MD of [facility] Primary Gastroenterologist:  unassigned    Reason for Consultation:  Gastric outlet obstruction   HPI: Shannon [name] is a 80 y.o. female.  xhx hypothyroidism, bilateral carpal tunnel syndrome, hypertension, seizure disorder, DJD, osteoporosis, hyperlipidemia, diabetic neuropathy, cerebrovascular disease, history of eosinophilic osteoarthritis.  S/p Hysterectomy and BSO, gastrojejunostomy r.  Normal colonoscopy in 2009.  6/7 - 09/19/2013 hospitalized at [facility].  On 09/07/2013 for exploratory laparotomy with EGD and resection of a gastrojejunal anastomosis with primary jejunojejunal anastomosis, repair of recurrent incisional hernia.  Peritoneal fluid grew candida and was treated with 2 weeks fluconoaxole.  Treated for CAP as well and required reintubation 6/10 - 6/12. . Afib with RVR resolved.  Chart mentions dysphagia mostly due to fluctuating mental status.  Discharge meds included Carafate and once daily Protonix which she continues to take along with 81 mg ASA.[name] Unable to locate H pylori testing or surgical pathology reading.   Admitted yesterday with gradual anorexia x one week, 2 days post prandial n/v, abdominal distention, constipation.  CT reveals GOO.  Labs show ARF.  Normal LFTs but Lipase 91 (atrophic pancreas, numerous liver cysts, GB absent and normal biliary tree on CT).  Since last night, stools became loose.  Normally has occasional constipation, was eating well, stable weight.  Started having less frequent stools over last several days.     Past Medical History  Diagnosis Date  . Pneumonia   . Thyroid disease   . Seizures   . Stroke   . Hyperlipidemia   .  Hypertension   . Diabetes mellitus without complication   . Small bowel obstruction   . Ventral hernia with bowel obstruction     Past Surgical History  Procedure Laterality Date  . Exploratory laparotomy  09-07-2013  . Cholecystectomy    . Tubal ligation    . Colectomy    . Hernia repair  2015    Prior to Admission medications   Medication Sig Start Date End Date Taking? Authorizing Provider  acetaminophen (TYLENOL EX ST ARTHRITIS PAIN) 500 MG tablet Take 500 mg by mouth every 6 (six) hours as needed (for arthritis pain).   Yes Historical Provider, MD  aspirin EC 81 MG tablet Take 1 tablet (81 mg total) by mouth daily. 12/26/13  Yes Eugenie Filler, MD  atorvastatin (LIPITOR) 10 MG tablet Take 10 mg by mouth daily at 6 PM.   Yes Historical Provider, MD  Calcium Carbonate (CALTRATE 600 PO) Take 2 capsules by mouth 2 (two) times daily.   Yes Historical Provider, MD  cycloSPORINE (RESTASIS) 0.05 % ophthalmic emulsion Place 1 drop into both eyes 2 (two) times daily.   Yes Historical Provider, MD  Dietary Management Product (VASCULERA) TABS Take 1 tablet by mouth daily.   Yes Historical Provider, MD  fluticasone (FLONASE) 50 MCG/ACT nasal spray Place 2 sprays into both nostrils daily.   Yes Historical Provider, MD  gabapentin (  NEURONTIN) 300 MG capsule Take 300 mg by mouth 3 (three) times daily.   Yes Historical Provider, MD  hydrALAZINE (APRESOLINE) 50 MG tablet Take one tablet by mouth four times daily to control blood pressure 01/15/14  Yes Mahima Pandey, MD  isosorbide mononitrate (IMDUR) 30 MG 24 hr tablet Take 30 mg by mouth daily.   Yes Historical Provider, MD  l-methylfolate-B6-B12 [facility]) 3-35-2 MG TABS Take one tablet by mouth once daily 01/09/14  Yes Mahima Pandey, MD  levothyroxine (SYNTHROID, LEVOTHROID) 175 MCG tablet Take 175 mcg by mouth daily before breakfast.   Yes Historical Provider, MD  losartan (COZAAR) 100 MG tablet Take 100 mg by mouth daily.   Yes Historical  Provider, MD  magnesium oxide (MAG-OX) 400 MG tablet Take one tablet by mouth twice daily 01/09/14  Yes Mahima Pandey, MD  metoprolol succinate (TOPROL-XL) 50 MG 24 hr tablet Take 50 mg by mouth daily. Take with or immediately following a meal.   Yes Historical Provider, MD  Multiple Vitamins-Minerals (MULTIVITAMIN & MINERAL PO) Take 1 tablet by mouth daily.   Yes Historical Provider, MD  Multiple Vitamins-Minerals (MULTIVITAMIN WITH MINERALS) tablet Take 1 tablet by mouth daily.   Yes Historical Provider, MD  pantoprazole (PROTONIX) 40 MG tablet Take 40 mg by mouth daily.   Yes Historical Provider, MD  sennosides-docusate sodium (SENOKOT-S) 8.6-50 MG tablet Take 1 tablet by mouth 2 (two) times daily.    Yes Historical Provider, MD  sertraline (ZOLOFT) 100 MG tablet Take 100 mg by mouth daily.   Yes Historical Provider, MD  sucralfate (CARAFATE) 1 G tablet Take 1 g by mouth 3 (three) times daily with meals.   Yes Historical Provider, MD  traMADol (ULTRAM) 50 MG tablet Take 50 mg by mouth every 12 (twelve) hours as needed for severe pain (pain).   Yes Historical Provider, MD    Scheduled Meds: . antiseptic oral rinse  7 mL Mouth Rinse q12n4p  . aspirin EC  81 mg Oral Daily  . chlorhexidine  15 mL Mouth Rinse BID  . cycloSPORINE  1 drop Both Eyes BID  . enoxaparin (LOVENOX) injection  30 mg Subcutaneous Daily  . fluticasone  2 spray Each Nare Daily  . hydrALAZINE  10 mg Intravenous Q6H  . insulin aspart  0-9 Units Subcutaneous 6 times per day  . levothyroxine  87.5 mcg Intravenous Daily  . [START ON 01/21/2014] pantoprazole (PROTONIX) IV  40 mg Intravenous Q12H  . sertraline  100 mg Oral Daily  . sucralfate  1 g Oral TID WC   Infusions: . sodium chloride    . pantoprozole (PROTONIX) infusion 8 mg/hr (01/18/14 0320)   PRN Meds: acetaminophen, acetaminophen, HYDROmorphone (DILAUDID) injection, ondansetron (ZOFRAN) IV   Allergies as of 01/17/2014  . (No Known Allergies)    Family  History  Problem Relation Age of Onset  . Stroke Mother     History   Social History  . Marital Status: Married    Spouse Name: N/A    Number of Children: N/A  . Years of Education: N/A   Occupational History  . Not on file.   Social History Main Topics  . Smoking status: Never Smoker   . Smokeless tobacco: Not on file  . Alcohol Use: No  . Drug Use: No  . Sexual Activity: Not on file   Other Topics Concern  . Not on file   Social History Narrative   Married in [place], lives in one story house with 2 people  and no pets   Occupation: ?   Has a living Will, POA, and doesn't have DNR   Was living with Husband (in frail health) in [place] until 09/2013.  After her [facility] admission they both came to live with daughter in [place]: Constitutional:  Weak, but able to perform her own ADLs ENT:  No nose bleeds Pulm:  No SOB or cough CV:  No palpitations, no LE edema.  GU:  No hematuria, no frequency GI:  No dysphagia.  Per HPI.  No blood per rectum or in emesis Heme:  No anemia   Transfusions:  none Neuro:  No headaches, no peripheral tingling or numbness Derm:  No itching, no rash or sores.  Endocrine:  No sweats or chills.  No polyuria or dysuria Immunization:  Not queried.  Travel:  None beyond local counties in last few months.    PHYSICAL EXAM: Vital signs in last 24 hours: Filed Vitals:   01/18/14 0531  BP: 150/63  Pulse: 67  Temp: 97.7 F (36.5 C)  Resp: 16   Wt Readings from Last 3 Encounters:  01/18/14 158 lb 12.8 oz (72.031 kg)  01/07/14 155 lb (70.308 kg)  12/25/13 149 lb 4.8 oz (67.722 kg)    General: somewhat frail but comfortable and alert elderly AAF Head:  No asymetry or swelling  Eyes:  No icterus or swelling Ears:  Not HOH  Nose:  No congestion or discharge Mouth:  Clear, moist Neck:  No JVD or mass Lungs:  Clear bil.  No cough or dyspnea Heart: RRR.  No MRG Abdomen:  Soft, NT, distended, BS absent.  Well healed  long midline scar.  No masses. Rectal: brown stool, no blood, not hemocculted   Musc/Skeltl: arthritic changes in knees, hands Extremities:  No pedal edema  Neurologic:  Oriented x 3.  Good historian.  Skin:  No rash or sores Tattoos:  none Nodes:  No cervical adenopathy.   Psych:  Pleasant, relaxed.  Appropriate.   Intake/Output from previous day: 10/17 0701 - 10/18 0700 In: 646.7 [I.V.:566.7; NG/GT:80] Out: 550 [Urine:400; Emesis/NG output:150] Intake/Output this shift:    LAB RESULTS:  Recent Labs  01/17/14 2120  WBC 6.9  HGB 11.1*  HCT 32.9*  PLT 227   BMET Lab Results  Component Value Date   NA 134* 01/17/2014   NA 136* 12/26/2013   NA 133* 12/23/2013   K 4.7 01/17/2014   K 4.9 12/26/2013   K 4.6 12/23/2013   CL 96 01/17/2014   CL 101 12/26/2013   CL 96 12/23/2013   CO2 26 01/17/2014   CO2 25 12/26/2013   CO2 26 12/23/2013   GLUCOSE 110* 01/17/2014   GLUCOSE 118* 12/26/2013   GLUCOSE 144* 12/23/2013   BUN 26* 01/17/2014   BUN 28* 12/26/2013   BUN 26* 12/23/2013   CREATININE 1.37* 01/17/2014   CREATININE 1.27* 12/26/2013   CREATININE 1.50* 12/23/2013   CALCIUM 10.4 01/17/2014   CALCIUM 9.5 12/26/2013   CALCIUM 10.0 12/23/2013   LFT  Recent Labs  01/17/14 2120  PROT 8.0  ALBUMIN 3.9  AST 33  ALT 16  ALKPHOS 73  BILITOT 0.4   PT/INR Lab Results  Component Value Date   INR 1.12 01/17/2014   INR 1.06 12/23/2013   Lipase     Component Value Date/Time   LIPASE 91* 01/17/2014 2120    Drugs of Abuse  No results found for this basename: labopia,  cocainscrnur,  labbenz,  amphetmu,  thcu,  labbarb     RADIOLOGY STUDIES: Ct Abdomen Pelvis W Contrast 01/17/2014  COMPARISON:  None.  FINDINGS: BODY WALL: Unremarkable.  LOWER CHEST: Cardiomegaly. Gastroesophageal reflux in the setting of gastric outlet obstruction.  ABDOMEN/PELVIS:  Liver: Numerous well circumscribed low-density lesions most consistent with cysts. No indication of malignancy history in the  electronic medical record.  Biliary: Cholecystectomy.  Pancreas: Atrophy without acute finding.  Spleen: Incidental sub cm cyst or other lesion in the anterior subcapsular spleen.  Adrenals: Unremarkable.  Kidneys and ureters: Lobulation of both renal cortices consistent with scarring. Bilateral renal atrophy. No hydronephrosis.  Bladder: Unremarkable.  Reproductive: Unremarkable for age.  The pelvic floor appears low.  Bowel: The stomach is massively dilated to the level of the pylorus which is circumferentially thickened (single wall thickness of 15 mm). The mucosa is distinct at the level of the pylorus, with submucosal low-density favoring inflammatory edema. No evidence for perforation or pneumatosis. There is been in enteroenterostomy which is patent. This may be related to electronic medical record history of small bowel obstruction. Sutures noted along the anterior gastric body, likely related to patient's history perforated gastric ulcer. Fluid levels present in the distal colon consistent with diarrhea. Colonic diverticulosis.  Retroperitoneum: No mass or adenopathy.  Peritoneum: No ascites or pneumoperitoneum.  Vascular: No acute abnormality.  OSSEOUS: No acute abnormalities. Remote appearing T11 compression fracture with approximate 50% height loss.  IMPRESSION: 1. High-grade gastric outlet obstruction with pylorus appearance favoring peptic ulcer disease over mass. Follow-up endoscopy or upper GI suggested. 2. Numerous non acute findings are noted above.   Electronically Signed   By: Jorje Guild M.D.   On: 01/17/2014 22:54    ENDOSCOPIC STUDIES: Per HPI  IMPRESSION:   *  Gastric outlet obstruction. S/p oversew repair of ulcer at [place] junction 09/2013.  On chronic Carafate and Protonix since then.  H Pylori status unknown.   *  S/p 09/2013 laparotomy and repair of incarcerated ventral hernia.   *  Mildly elevated Lipase, normal LFTs.  lowdensity liver lesions c/w cysts.  Prior cholecystectomy  and normal biliary tree.     PLAN:     *  EGD arranged for 10/19.  *  Continue NG suction and Protonix drip. Stopped the Carafate.    Shannon [name]  01/18/2014, 9:19 AM Pager: [phone]  GI Attending Note   Chart was reviewed and patient was examined. X-rays and lab were reviewed.    I agree with management and plans. It's not entirely clear what surgery was performed in June, 2015 and what her anatomy is.  Plan to proceed with EGD in am.  Sandy Salaam. Deatra Ina, M.D., [facility] Cell [phone]

## 2014-01-18 NOTE — Progress Notes (Signed)
Patient ID: Shannon [name], female   DOB: 06-17-1923, 80 y.o.   MRN: [id number]    Subjective: Feels better after NG tube and also vomited solid food last night. Denies abdominal pain.  Objective: Vital signs in last 24 hours: Temp:  [97.7 F (36.5 C)-98.2 F (36.8 C)] 97.7 F (36.5 C) (10/18 0531) Pulse Rate:  [58-85] 70 (10/18 0938) Resp:  [11-25] 16 (10/18 0531) BP: (150-228)/(59-101) 191/75 mmHg (10/18 0938) SpO2:  [96 %-100 %] 98 % (10/18 0531) Weight:  [158 lb 12.8 oz (72.031 kg)] 158 lb 12.8 oz (72.031 kg) (10/18 0131) Last BM Date: 01/17/14  Intake/Output from previous day: 10/17 0701 - 10/18 0700 In: 646.7 [I.V.:566.7; NG/GT:80] Out: 550 [Urine:400; Emesis/NG output:150] Intake/Output this shift:    General appearance: alert, cooperative and no distress GI: much less distended. Soft and nontender.  Lab Results:   Recent Labs  01/17/14 2120  WBC 6.9  HGB 11.1*  HCT 32.9*  PLT 227   BMET  Recent Labs  01/17/14 2120  NA 134*  K 4.7  CL 96  CO2 26  GLUCOSE 110*  BUN 26*  CREATININE 1.37*  CALCIUM 10.4     Studies/Results: Ct Abdomen Pelvis W Contrast  01/17/2014   CLINICAL DATA:  Vomiting. Abdominal pain. History of small bowel obstruction.  EXAM: CT ABDOMEN AND PELVIS WITH CONTRAST  TECHNIQUE: Multidetector CT imaging of the abdomen and pelvis was performed using the standard protocol following bolus administration of intravenous contrast.  CONTRAST:  50mL OMNIPAQUE IOHEXOL 300 MG/ML SOLN, 80mL OMNIPAQUE IOHEXOL 300 MG/ML SOLN  COMPARISON:  None.  FINDINGS: BODY WALL: Unremarkable.  LOWER CHEST: Cardiomegaly. Gastroesophageal reflux in the setting of gastric outlet obstruction.  ABDOMEN/PELVIS:  Liver: Numerous well circumscribed low-density lesions most consistent with cysts. No indication of malignancy history in the electronic medical record.  Biliary: Cholecystectomy.  Pancreas: Atrophy without acute finding.  Spleen: Incidental sub cm cyst or other  lesion in the anterior subcapsular spleen.  Adrenals: Unremarkable.  Kidneys and ureters: Lobulation of both renal cortices consistent with scarring. Bilateral renal atrophy. No hydronephrosis.  Bladder: Unremarkable.  Reproductive: Unremarkable for age.  The pelvic floor appears low.  Bowel: The stomach is massively dilated to the level of the pylorus which is circumferentially thickened (single wall thickness of 15 mm). The mucosa is distinct at the level of the pylorus, with submucosal low-density favoring inflammatory edema. No evidence for perforation or pneumatosis. There is been in enteroenterostomy which is patent. This may be related to electronic medical record history of small bowel obstruction. Sutures noted along the anterior gastric body, likely related to patient's history perforated gastric ulcer. Fluid levels present in the distal colon consistent with diarrhea. Colonic diverticulosis.  Retroperitoneum: No mass or adenopathy.  Peritoneum: No ascites or pneumoperitoneum.  Vascular: No acute abnormality.  OSSEOUS: No acute abnormalities. Remote appearing T11 compression fracture with approximate 50% height loss.  IMPRESSION: 1. High-grade gastric outlet obstruction with pylorus appearance favoring peptic ulcer disease over mass. Follow-up endoscopy or upper GI suggested. 2. Numerous non acute findings are noted above.   Electronically Signed   By: Tiburcio Pea M.D.   On: 01/17/2014 22:54    Anti-infectives: Anti-infectives   None      Assessment/Plan: Gastric outlet obstruction. Review of discharge summary from [facility] indicates In June she underwent resection of a previous gastrojejunostomy due to perforated ulcer with jejunojejunostomy. Also repair of incarcerated ventral hernia. We'll try to obtain operative note details. Plan for endoscopy tomorrow.  LOS: 1 day    [name] T 01/18/2014

## 2014-01-18 NOTE — Progress Notes (Addendum)
Patient ID: Shannon [name], female   DOB: 1924-02-11, 80 y.o.   MRN: [id number] [facility] PROGRESS NOTE  Shannon [name] [id number]:[id number] DOB: 09-22-1923 DOA: 01/17/2014 PCP: Oneal Grout, MD  Brief narrative: Addendum to admission note done 01/18/2014  80 year old female with past medical history of hypertension, dyslipidemia, diabetes, CKD stage 3, depression, hypothyroidism, history underwent exploratory laparotomy with EGD and resection of a gastrojejunal anastomosis with primary jejunojejunal anastomosis, repair of recurrent incisional hernia in 09/2013, atrial fibrillation, resolved. Patient presented to [facility] ED with complaints of sharp diffuse abdominal pain for past few days prior to this admission associated with nausea and vomiting. No reports of fevers or chills. No reports of blood in stool. On admission, patient was found to have gastric outlet obstruction as evidenced by CT abdomen. NG tube placed and GI and surgery seeing the patient in consultation.   Assessment/Plan:    Principal Problem:   Gastric outlet obstruction  Continue NG tube suction, keep NPO.  Continue IV fluids and antiemetics as needed.  Plan for EGD in am.  Appreciate GI and surgery following. Active Problems:   Essential hypertension, benign  Stop PO meds; started hydralazine and metoprolol IV   GERD (gastroesophageal reflux disease)  Continue protonix drip.   Dyslipidemia  Statin on hold while pt is NPO.   DM (diabetes mellitus) type II uncontrolled, periph vascular disorder  Continue SSI for now.   Diabetic neuropathy  Gabapentin on hold while pt is NPO   Hypothyroidism  Change levothyroxine to IV.   Depression  Ativan PRN. Zoloft on hold since pt is NPO   Anemia of chronic disease  Secondary to history of CKD.  Hemoglobin is stable.  No current indications for transfusion.    CKD (chronic kidney disease) stage 3, GFR 30-59 ml/min  Baseline creatinine 1.5 and on this admission  1.37.  Continue to monitor BMP.  DVT Prophylaxis   Lovenox subQ  Code Status: Full.  Family Communication:  plan of care discussed with the patient Disposition Plan: Home when stable.    IV Access:   Peripheral IV Procedures and diagnostic studies:    Ct Abdomen Pelvis W Contrast 01/17/2014    1. High-grade gastric outlet obstruction with pylorus appearance favoring peptic ulcer disease over mass. Follow-up endoscopy or upper GI suggested. 2. Numerous non acute findings are noted above.    Medical Consultants:   Surgery  Gastroenterology  Other Consultants:   None  Anti-Infectives:   None    Manson Passey, MD  [facility] Pager [phone]  If 7PM-7AM, please contact night-coverage [email or web address] Password [email or web address] 01/18/2014, 7:45 AM   LOS: 1 day    HPI/Subjective: No acute overnight events.  Objective: Filed Vitals:   01/18/14 0113 01/18/14 0114 01/18/14 0131 01/18/14 0531  BP: 185/63 170/62 150/59 150/63  Pulse: 65 64 72 67  Temp:   98 F (36.7 C) 97.7 F (36.5 C)  TempSrc:   Oral Oral  Resp: 13 15 18 16   Height:   5\' 6"  (1.676 m)   Weight:   72.031 kg (158 lb 12.8 oz)   SpO2: 97% 96% 96% 98%    Intake/Output Summary (Last 24 hours) at 01/18/14 0745 Last data filed at 01/18/14 0600  Gross per 24 hour  Intake 646.67 ml  Output    550 ml  Net  96.67 ml    Exam:   General:  Pt is not in acute distress  Cardiovascular: Regular rate and rhythm, S1/S2 appreciated   Respiratory: Clear  to auscultation bilaterally, no wheezing, no crackles, no rhonchi  Abdomen: distended, non tender, bowel sounds present; has NG tube  Extremities: trace pedal pitting edema pulses DP and PT palpable bilaterally  Neuro: Grossly nonfocal  Data Reviewed: Basic Metabolic Panel:  Recent Labs Lab 01/17/14 2120  NA 134*  K 4.7  CL 96  CO2 26  GLUCOSE 110*  BUN 26*  CREATININE 1.37*  CALCIUM 10.4   Liver Function Tests:  Recent Labs Lab 01/17/14 2120   AST 33  ALT 16  ALKPHOS 73  BILITOT 0.4  PROT 8.0  ALBUMIN 3.9    Recent Labs Lab 01/17/14 2120  LIPASE 91*   No results found for this basename: AMMONIA,  in the last 168 hours CBC:  Recent Labs Lab 01/17/14 2120  WBC 6.9  NEUTROABS 3.8  HGB 11.1*  HCT 32.9*  MCV 90.1  PLT 227   Cardiac Enzymes: No results found for this basename: CKTOTAL, CKMB, CKMBINDEX, TROPONINI,  in the last 168 hours BNP: No components found with this basename: POCBNP,  CBG:  Recent Labs Lab 01/18/14 0403 01/18/14 0732  GLUCAP 141* 100*    No results found for this or any previous visit (from the past 240 hour(s)).   Scheduled Meds: . aspirin EC  81 mg Oral Daily  . atorvastatin  10 mg Oral q1800  . enoxaparin (LOVENOX) i  30 mg Subcutaneous Daily  . fluticasone  2 spray Each Nare Daily  . gabapentin  300 mg Oral TID  . hydrALAZINE  50 mg Oral 4 times per day  . insulin aspart  0-9 Units Subcutaneous 6 times per day  . isosorbide mononitrate  30 mg Oral Daily  . l-methylfolate-B6-B12  1 tablet Oral Daily  . levothyroxine  175 mcg Oral QAC breakfast  . losartan  100 mg Oral Daily  . metoprolol succinate  50 mg Oral Daily  .  pantoprazole (PROTONIX) IV  40 mg Intravenous Q12H  . sertraline  100 mg Oral Daily  . sucralfate  1 g Oral TID WC   Continuous Infusions: . sodium chloride 100 mL/hr (01/18/14 0111)  . sodium chloride    . pantoprozole (PROTONIX) infusion 8 mg/hr (01/18/14 0320)

## 2014-01-18 NOTE — H&P (Signed)
Shannon [name] is an 80 y.o. female.    Pcp:  Dr. Guinevere Ferrari  [facility])  Chief Complaint: abdominal pain HPI: 80yo female with hx of sbo apparently c/o diffuse abdominal pain "sharp" as well as n/v.  No bloody emesis.  Pain has been going on for about 2-3 days but worse last nite.  Pt denies fever, chills, cp, palp, sob, diarrhea, constipation, brbpr, black stool.  Pt took milk of magnesia and took dulcolax suppository last nite, but no good bowel movement.  Ate breakfast and then her pain got worse earlier today.  Since not improving , pt came to ER for evaluation and found to have gastric outlet obstruction on Ct scan possibly due to pud.  ER consulted surgery who thought that medical management for now.  NGT to low intermittent suction began.  Pt wil be admitted for gastric outlet obstruction.    Past Medical History  Diagnosis Date  . Pneumonia   . Thyroid disease   . Seizures   . Stroke   . Hyperlipidemia   . Hypertension   . Diabetes mellitus without complication   . Small bowel obstruction   . Ventral hernia with bowel obstruction     Past Surgical History  Procedure Laterality Date  . Exploratory laparotomy  09-07-2013  . Cholecystectomy    . Tubal ligation    . Colectomy    . Hernia repair  2015    Family History  Problem Relation Age of Onset  . Stroke Mother    Social History:  reports that she has never smoked. She does not have any smokeless tobacco history on file. She reports that she does not drink alcohol or use illicit drugs.  Allergies: No Known Allergies   (Not in a hospital admission)  Results for orders placed during the hospital encounter of 01/17/14 (from the past 48 hour(s))  COMPREHENSIVE METABOLIC PANEL     Status: Abnormal   Collection Time    01/17/14  9:20 PM      Result Value Ref Range   Sodium 134 (*) 137 - 147 mEq/L   Potassium 4.7  3.7 - 5.3 mEq/L   Chloride 96  96 - 112 mEq/L   CO2 26  19 - 32 mEq/L   Glucose, Bld 110 (*) 70 -  99 mg/dL   BUN 26 (*) 6 - 23 mg/dL   Creatinine, Ser 1.37 (*) 0.50 - 1.10 mg/dL   Calcium 10.4  8.4 - 10.5 mg/dL   Total Protein 8.0  6.0 - 8.3 g/dL   Albumin 3.9  3.5 - 5.2 g/dL   AST 33  0 - 37 U/L   Comment: SLIGHT HEMOLYSIS     HEMOLYSIS AT THIS LEVEL MAY AFFECT RESULT   ALT 16  0 - 35 U/L   Alkaline Phosphatase 73  39 - 117 U/L   Total Bilirubin 0.4  0.3 - 1.2 mg/dL   GFR calc non Af Amer 33 (*) >90 mL/min   GFR calc Af Amer 39 (*) >90 mL/min   Comment: (NOTE)     The eGFR has been calculated using the CKD EPI equation.     This calculation has not been validated in all clinical situations.     eGFR's persistently <90 mL/min signify possible Chronic Kidney     Disease.   Anion gap 12  5 - 15  LIPASE, BLOOD     Status: Abnormal   Collection Time    01/17/14  9:20 PM  Result Value Ref Range   Lipase 91 (*) 11 - 59 U/L  CBC WITH DIFFERENTIAL     Status: Abnormal   Collection Time    01/17/14  9:20 PM      Result Value Ref Range   WBC 6.9  4.0 - 10.5 K/uL   RBC 3.65 (*) 3.87 - 5.11 MIL/uL   Hemoglobin 11.1 (*) 12.0 - 15.0 g/dL   HCT 32.9 (*) 36.0 - 46.0 %   MCV 90.1  78.0 - 100.0 fL   MCH 30.4  26.0 - 34.0 pg   MCHC 33.7  30.0 - 36.0 g/dL   RDW 15.8 (*) 11.5 - 15.5 %   Platelets 227  150 - 400 K/uL   Neutrophils Relative % 54  43 - 77 %   Neutro Abs 3.8  1.7 - 7.7 K/uL   Lymphocytes Relative 18  12 - 46 %   Lymphs Abs 1.2  0.7 - 4.0 K/uL   Monocytes Relative 15 (*) 3 - 12 %   Monocytes Absolute 1.0  0.1 - 1.0 K/uL   Eosinophils Relative 13 (*) 0 - 5 %   Eosinophils Absolute 0.9 (*) 0.0 - 0.7 K/uL   Basophils Relative 0  0 - 1 %   Basophils Absolute 0.0  0.0 - 0.1 K/uL  APTT     Status: None   Collection Time    01/17/14  9:20 PM      Result Value Ref Range   aPTT 34  24 - 37 seconds  PROTIME-INR     Status: None   Collection Time    01/17/14  9:20 PM      Result Value Ref Range   Prothrombin Time 14.6  11.6 - 15.2 seconds   INR 1.12  0.00 - 1.49   URINALYSIS, ROUTINE W REFLEX MICROSCOPIC     Status: None   Collection Time    01/17/14  9:30 PM      Result Value Ref Range   Color, Urine YELLOW  YELLOW   APPearance CLEAR  CLEAR   Specific Gravity, Urine 1.010  1.005 - 1.030   pH 8.0  5.0 - 8.0   Glucose, UA NEGATIVE  NEGATIVE mg/dL   Hgb urine dipstick NEGATIVE  NEGATIVE   Bilirubin Urine NEGATIVE  NEGATIVE   Ketones, ur NEGATIVE  NEGATIVE mg/dL   Protein, ur NEGATIVE  NEGATIVE mg/dL   Urobilinogen, UA 0.2  0.0 - 1.0 mg/dL   Nitrite NEGATIVE  NEGATIVE   Leukocytes, UA NEGATIVE  NEGATIVE   Comment: MICROSCOPIC NOT DONE ON URINES WITH NEGATIVE PROTEIN, BLOOD, LEUKOCYTES, NITRITE, OR GLUCOSE <1000 mg/dL.  Randolm Idol, ED     Status: None   Collection Time    01/17/14  9:31 PM      Result Value Ref Range   Troponin i, poc 0.02  0.00 - 0.08 ng/mL   Comment 3            Comment: Due to the release kinetics of cTnI,     a negative result within the first hours     of the onset of symptoms does not rule out     myocardial infarction with certainty.     If myocardial infarction is still suspected,     repeat the test at appropriate intervals.  POC OCCULT BLOOD, ED     Status: None   Collection Time    01/17/14  9:36 PM      Result Value Ref Range  Fecal Occult Bld NEGATIVE  NEGATIVE   Ct Abdomen Pelvis W Contrast  01/17/2014   CLINICAL DATA:  Vomiting. Abdominal pain. History of small bowel obstruction.  EXAM: CT ABDOMEN AND PELVIS WITH CONTRAST  TECHNIQUE: Multidetector CT imaging of the abdomen and pelvis was performed using the standard protocol following bolus administration of intravenous contrast.  CONTRAST:  70m OMNIPAQUE IOHEXOL 300 MG/ML SOLN, 877mOMNIPAQUE IOHEXOL 300 MG/ML SOLN  COMPARISON:  None.  FINDINGS: BODY WALL: Unremarkable.  LOWER CHEST: Cardiomegaly. Gastroesophageal reflux in the setting of gastric outlet obstruction.  ABDOMEN/PELVIS:  Liver: Numerous well circumscribed low-density lesions most  consistent with cysts. No indication of malignancy history in the electronic medical record.  Biliary: Cholecystectomy.  Pancreas: Atrophy without acute finding.  Spleen: Incidental sub cm cyst or other lesion in the anterior subcapsular spleen.  Adrenals: Unremarkable.  Kidneys and ureters: Lobulation of both renal cortices consistent with scarring. Bilateral renal atrophy. No hydronephrosis.  Bladder: Unremarkable.  Reproductive: Unremarkable for age.  The pelvic floor appears low.  Bowel: The stomach is massively dilated to the level of the pylorus which is circumferentially thickened (single wall thickness of 15 mm). The mucosa is distinct at the level of the pylorus, with submucosal low-density favoring inflammatory edema. No evidence for perforation or pneumatosis. There is been in enteroenterostomy which is patent. This may be related to electronic medical record history of small bowel obstruction. Sutures noted along the anterior gastric body, likely related to patient's history perforated gastric ulcer. Fluid levels present in the distal colon consistent with diarrhea. Colonic diverticulosis.  Retroperitoneum: No mass or adenopathy.  Peritoneum: No ascites or pneumoperitoneum.  Vascular: No acute abnormality.  OSSEOUS: No acute abnormalities. Remote appearing T11 compression fracture with approximate 50% height loss.  IMPRESSION: 1. High-grade gastric outlet obstruction with pylorus appearance favoring peptic ulcer disease over mass. Follow-up endoscopy or upper GI suggested. 2. Numerous non acute findings are noted above.   Electronically Signed   By: JoJorje Guild.D.   On: 01/17/2014 22:54    Review of Systems  Constitutional: Negative.   Eyes: Negative.   Respiratory: Negative.   Cardiovascular: Negative.   Gastrointestinal: Positive for nausea, vomiting and abdominal pain. Negative for heartburn, diarrhea, constipation, blood in stool and melena.  Genitourinary: Negative.    Musculoskeletal: Positive for joint pain.  Skin: Negative.   Neurological: Negative.   Endo/Heme/Allergies: Negative.   Psychiatric/Behavioral: Negative.     Blood pressure 228/101, pulse 85, temperature 98.2 F (36.8 C), temperature source Oral, resp. rate 25, SpO2 99.00%. Physical Exam  Constitutional: She is oriented to person, place, and time. She appears well-developed and well-nourished.  HENT:  Head: Normocephalic and atraumatic.  Eyes: Conjunctivae and EOM are normal. Pupils are equal, round, and reactive to light.  Neck: Normal range of motion. Neck supple.  Cardiovascular: Normal rate and regular rhythm.   Respiratory: Effort normal and breath sounds normal.  GI: Soft. Bowel sounds are normal.  Musculoskeletal: Normal range of motion. She exhibits no edema and no tenderness.  Neurological: She is alert and oriented to person, place, and time. She has normal reflexes. She displays normal reflexes. No cranial nerve deficit. She exhibits normal muscle tone. Coordination normal.  Skin: Skin is warm and dry. No rash noted. No erythema. No pallor.  Psychiatric: She has a normal mood and affect. Her behavior is normal. Judgment and thought content normal.     Assessment/Plan Abdominal pain: secondary to gastric outlet obstruction Dilaudid iv NPO except for medication  and sips NGT to low intermittent suction  Gastric outlet obstruction secondary to ? pud protonix 14m iv x1 then 8683mper hour NPO Please consult GI in am Appreciate surgery input  Elevation in lipase: NPO NS iv Check lipase in am  Hypertension uncontrolled Clonidine 0.83m70mo q6h prn sbp>160 Cont current bp medications  Gerd: cont carafate Start on protonix iv for pud  Anemia: check cbc in am  Hyponatremia: ns iv, check cmp in am  ARF: mild, likely  Secondary to dehydration, NS iv, check cmp in am Consider further w/up if not resolving, consider dc losartan if not improving  Hypothyroidism:   Cont synthroid  CVA: cont aspirin, lipitor  Riordan Walle 01/18/2014, 12:45 AM

## 2014-01-19 ENCOUNTER — Encounter (HOSPITAL_COMMUNITY): Payer: Self-pay | Admitting: *Deleted

## 2014-01-19 ENCOUNTER — Encounter (HOSPITAL_COMMUNITY)
Admission: EM | Disposition: A | Discharge status: Home / Self Care | Payer: Self-pay | Source: Home / Self Care | Attending: Internal Medicine

## 2014-01-19 DIAGNOSIS — E1159 Type 2 diabetes mellitus with other circulatory complications: Secondary | ICD-10-CM

## 2014-01-19 HISTORY — PX: ESOPHAGOGASTRODUODENOSCOPY: SHX5428

## 2014-01-19 LAB — GLUCOSE, CAPILLARY
Glucose-Capillary: 95 mg/dL (ref 70–99)
Glucose-Capillary: 74 mg/dL (ref 70–99)
Glucose-Capillary: 77 mg/dL (ref 70–99)
Glucose-Capillary: 71 mg/dL (ref 70–99)
Glucose-Capillary: 123 mg/dL — ABNORMAL HIGH (ref 70–99)
Glucose-Capillary: 115 mg/dL — ABNORMAL HIGH (ref 70–99)

## 2014-01-19 SURGERY — EGD (ESOPHAGOGASTRODUODENOSCOPY)
Anesthesia: Moderate Sedation

## 2014-01-19 MED ORDER — MIDAZOLAM HCL 10 MG/2ML IJ SOLN
INTRAMUSCULAR | Status: AC
  Filled 2014-01-19: qty 2

## 2014-01-19 MED ORDER — MIDAZOLAM HCL 10 MG/2ML IJ SOLN
INTRAMUSCULAR | Status: DC | PRN
  Administered 2014-01-19 (×3): 1 mg via INTRAVENOUS
  Administered 2014-01-19: 2 mg via INTRAVENOUS

## 2014-01-19 MED ORDER — BUTAMBEN-TETRACAINE-BENZOCAINE 2-2-14 % EX AERO
INHALATION_SPRAY | CUTANEOUS | Status: DC | PRN
  Administered 2014-01-19: 2 via TOPICAL

## 2014-01-19 MED ORDER — DIPHENHYDRAMINE HCL 50 MG/ML IJ SOLN
INTRAMUSCULAR | Status: AC
  Filled 2014-01-19: qty 1

## 2014-01-19 MED ORDER — FENTANYL CITRATE 0.05 MG/ML IJ SOLN
INTRAMUSCULAR | Status: AC
  Filled 2014-01-19: qty 2

## 2014-01-19 MED ORDER — FENTANYL CITRATE 0.05 MG/ML IJ SOLN
INTRAMUSCULAR | Status: DC | PRN
  Administered 2014-01-19 (×2): 12.5 ug via INTRAVENOUS

## 2014-01-19 NOTE — Op Note (Signed)
[place], [id number]   ENDOSCOPY PROCEDURE REPORT  PATIENT: Shannon [name], Shannon [name]  MR#: [id number] BIRTHDATE: October 28, 1923 , 89  yrs. old GENDER: female ENDOSCOPIST: Eustace Quail, MD REFERRED BY:  Excell Seltzer, M.D. PROCEDURE DATE:  01/19/2014 PROCEDURE:  EGD, diagnostic ASA CLASS:     Class III INDICATIONS:  vomiting and abnormal CT of the GI tract(gastric outlet obstruction). MEDICATIONS: Fentanyl 25 mcg IV and Versed 5 mg IV TOPICAL ANESTHETIC: Cetacaine Spray  DESCRIPTION OF PROCEDURE: After the risks benefits and alternatives of the procedure were thoroughly explained, informed consent was obtained.  The Pentax Gastroscope [id number] endoscope was introduced through the mouth and advanced to the second portion of the duodenum , Without limitations.  The instrument was slowly withdrawn as the mucosa was fully examined.  EXAM:The esophagus revealed erosive esophagitis involving the distal 4 cm.  The proximal stomach was replaced by a large food mass, or bezoar.  There was no obvious altered surgical anatomy.  The pylorus was easily traversed without ulcer or high-grade stricture. The duodenal bulb, ampulla, and post bulbar duodenum were normal. Retroflexed views revealed large bezoar.     The scope was then withdrawn from the patient and the procedure completed.  COMPLICATIONS: There were no immediate complications.  ENDOSCOPIC IMPRESSION: 1. Erosive esophagitis 2. 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 No endoscopic evidence for surgical altered anatomy, stomach ulcer, or high-grade stenosis 4. Question motility disorder. Question downstream process. Question what type of surgical procedure recently done and why  RECOMMENDATIONS: 1. Continue PPI 2. Clear liquids only 3. Possibly clinical benefit for surgical removal of bezoar if no improvement with time 4. Discuss with surgery. REPEAT  EXAM:  eSigned:  Eustace Quail, MD 01/19/2014 12:18 PM    RA:[id number] [place], MD and Erskine Emery, MD  PATIENT NAME:  Shannon [name], Shannon [name] MR#: [id number]

## 2014-01-19 NOTE — Progress Notes (Signed)
GI ATTENDING  Case discussed with general surgery and family. Plan upper endoscopy tomorrow in an attempt to disrupt/remove bezoar. We will keep NG tube out for now.   Docia Chuck. Geri Seminole., M.D. [facility] Division of Gastroenterology

## 2014-01-19 NOTE — Progress Notes (Signed)
[facility] Progress Note  Subjective:  Feels ok.  NGT is very uncomfortable, but overall feels better with it in.  Objective:  Vital signs in last 24 hours: Temp:  [99 F (37.2 C)-99.8 F (37.7 C)] 99.7 F (37.6 C) (10/19 0528) Pulse Rate:  [69-86] 86 (10/19 0528) Resp:  [16-20] 19 (10/19 0528) BP: (144-191)/(55-75) 161/59 mmHg (10/19 0528) SpO2:  [95 %-99 %] 95 % (10/19 0528) Weight:  [162 lb 9.6 oz (73.755 kg)] 162 lb 9.6 oz (73.755 kg) (10/19 0622) Last BM Date: 01/18/14 General:  Alert, Well-developed, in NAD Heart:  Regular rate and rhythm; no murmurs Pulm:  CTAB.  No W/R/R. Abdomen:  Soft, non-distended. Normal bowel sounds.  Non-tender.  Multiple scars (laparotomy scar noted) Extremities:  Without edema. Neurologic:  Alert and  oriented x4;  grossly normal neurologically. Psych:  Alert and cooperative. Normal mood and affect.  Intake/Output from previous day: 10/18 0701 - 10/19 0700 In: 1911.3 [I.V.:1911.3] Out: 1550 [Urine:1400; Emesis/NG output:150]  Lab Results:  Recent Labs  01/17/14 2120  WBC 6.9  HGB 11.1*  HCT 32.9*  PLT 227   BMET  Recent Labs  01/17/14 2120  NA 134*  K 4.7  CL 96  CO2 26  GLUCOSE 110*  BUN 26*  CREATININE 1.37*  CALCIUM 10.4   LFT  Recent Labs  01/17/14 2120  PROT 8.0  ALBUMIN 3.9  AST 33  ALT 16  ALKPHOS 73  BILITOT 0.4   PT/INR  Recent Labs  01/17/14 2120  LABPROT 14.6  INR 1.12   Ct Abdomen Pelvis W Contrast  01/17/2014   CLINICAL DATA:  Vomiting. Abdominal pain. History of small bowel obstruction.  EXAM: CT ABDOMEN AND PELVIS WITH CONTRAST  TECHNIQUE: Multidetector CT imaging of the abdomen and pelvis was performed using the standard protocol following bolus administration of intravenous contrast.  CONTRAST:  26mL OMNIPAQUE IOHEXOL 300 MG/ML SOLN, 69mL OMNIPAQUE IOHEXOL 300 MG/ML SOLN  COMPARISON:  None.  FINDINGS: BODY WALL: Unremarkable.  LOWER CHEST: Cardiomegaly. Gastroesophageal  reflux in the setting of gastric outlet obstruction.  ABDOMEN/PELVIS:  Liver: Numerous well circumscribed low-density lesions most consistent with cysts. No indication of malignancy history in the electronic medical record.  Biliary: Cholecystectomy.  Pancreas: Atrophy without acute finding.  Spleen: Incidental sub cm cyst or other lesion in the anterior subcapsular spleen.  Adrenals: Unremarkable.  Kidneys and ureters: Lobulation of both renal cortices consistent with scarring. Bilateral renal atrophy. No hydronephrosis.  Bladder: Unremarkable.  Reproductive: Unremarkable for age.  The pelvic floor appears low.  Bowel: The stomach is massively dilated to the level of the pylorus which is circumferentially thickened (single wall thickness of 15 mm). The mucosa is distinct at the level of the pylorus, with submucosal low-density favoring inflammatory edema. No evidence for perforation or pneumatosis. There is been in enteroenterostomy which is patent. This may be related to electronic medical record history of small bowel obstruction. Sutures noted along the anterior gastric body, likely related to patient's history perforated gastric ulcer. Fluid levels present in the distal colon consistent with diarrhea. Colonic diverticulosis.  Retroperitoneum: No mass or adenopathy.  Peritoneum: No ascites or pneumoperitoneum.  Vascular: No acute abnormality.  OSSEOUS: No acute abnormalities. Remote appearing T11 compression fracture with approximate 50% height loss.  IMPRESSION: 1. High-grade gastric outlet obstruction with pylorus appearance favoring peptic ulcer disease over mass. Follow-up endoscopy or upper GI suggested. 2. Numerous non acute findings are noted above.   Electronically Signed  By: Jorje Guild M.D.   On: 01/17/2014 22:54    Assessment / Plan: *Gastric outlet obstruction. S/p oversew repair of ulcer at [place] junction 09/2013. On chronic Carafate and Protonix since then.  H Pylori status unknown.  Rule out  anastomotic ulcer vs mass, etc.  *S/p 09/2013 laparotomy and repair of incarcerated ventral hernia.  *Mildly elevated Lipase, normal LFTs, low-density liver lesions c/w cysts. Prior cholecystectomy and normal biliary tree.    -EGD later today.  Lovenox held for this AM. -Continue NG suction and Protonix drip for now.  -Per surgery recs, she should return to [facility] if she should require any further surgery.   LOS: 2 days   ZEHR, JESSICA D.  01/19/2014, 8:24 AM  Pager number [phone]  GI ATTENDING  Agree. For EGD today, in effort to define the problem. Possible stricture dilation, if appropriate.The nature of the procedure, as well as the risks, benefits, and alternatives were carefully and thoroughly reviewed with the patient. Ample time for discussion and questions allowed. The patient understood, was satisfied, and agreed to proceed.  Docia Chuck. Geri Seminole., M.D. [facility]

## 2014-01-19 NOTE — Anesthesia Preprocedure Evaluation (Addendum)
Anesthesia Evaluation  Patient identified by MRN, date of birth, ID band Patient awake    Reviewed: Allergy & Precautions, H&P , NPO status , Patient's Chart, lab work & pertinent test results, reviewed documented beta blocker date and time   History of Anesthesia Complications Negative for: history of anesthetic complications  Airway Mallampati: III TM Distance: >3 FB Neck ROM: Full    Dental no notable dental hx. (+) Poor Dentition, Missing   Pulmonary neg pulmonary ROS,  breath sounds clear to auscultation  Pulmonary exam normal       Cardiovascular hypertension, Pt. on medications and Pt. on home beta blockers + CAD, + Cardiac Stents and + Peripheral Vascular Disease + Valvular Problems/Murmurs AI Rhythm:Regular Rate:Normal     Neuro/Psych PSYCHIATRIC DISORDERS Anxiety Depression CVA (L sided weakness), Residual Symptoms    GI/Hepatic Neg liver ROS, PUD, GERD-  Medicated and Controlled,  Endo/Other  negative endocrine ROSdiabetes, Type 2Hypothyroidism   Renal/GU CRFRenal disease  negative genitourinary   Musculoskeletal negative musculoskeletal ROS (+)   Abdominal   Peds negative pediatric ROS (+)  Hematology  (+) anemia ,   Anesthesia Other Findings   Reproductive/Obstetrics negative OB ROS                          Anesthesia Physical Anesthesia Plan  ASA: III  Anesthesia Plan: MAC   Post-op Pain Management:    Induction: Intravenous  Airway Management Planned:   Additional Equipment:   Intra-op Plan:   Post-operative Plan: Extubation in OR  Informed Consent: I have reviewed the patients History and Physical, chart, labs and discussed the procedure including the risks, benefits and alternatives for the proposed anesthesia with the patient or authorized representative who has indicated his/her understanding and acceptance.   Dental advisory given  Plan Discussed with:  CRNA  Anesthesia Plan Comments:         Anesthesia Quick Evaluation

## 2014-01-19 NOTE — Progress Notes (Signed)
[facility] Progress Note     Subjective: Pt's pain and nausea resolved.  NG bothering her.  Anxious to get procedure.  Says she'd be open to going to [facility], but would like to talk with her daughter.    Objective: Vital signs in last 24 hours: Temp:  [99 F (37.2 C)-99.8 F (37.7 C)] 99.7 F (37.6 C) (10/19 0528) Pulse Rate:  [69-86] 86 (10/19 0528) Resp:  [16-20] 19 (10/19 0528) BP: (144-191)/(55-75) 161/59 mmHg (10/19 0528) SpO2:  [95 %-99 %] 95 % (10/19 0528) Weight:  [162 lb 9.6 oz (73.755 kg)] 162 lb 9.6 oz (73.755 kg) (10/19 0622) Last BM Date: 01/18/14  Intake/Output from previous day: 10/18 0701 - 10/19 0700 In: 1911.3 [I.V.:1911.3] Out: 1550 [Urine:1400; Emesis/NG output:150] Intake/Output this shift:    PE: Gen:  Alert, NAD, pleasant Abd: Soft, mild distension, minimal tenderness, +BS, no HSM, well healed scars noted   Lab Results:   Recent Labs  01/17/14 2120  WBC 6.9  HGB 11.1*  HCT 32.9*  PLT 227   BMET  Recent Labs  01/17/14 2120  NA 134*  K 4.7  CL 96  CO2 26  GLUCOSE 110*  BUN 26*  CREATININE 1.37*  CALCIUM 10.4   PT/INR  Recent Labs  01/17/14 2120  LABPROT 14.6  INR 1.12   CMP     Component Value Date/Time   NA 134* 01/17/2014 2120   NA 127* 09/30/2013   K 4.7 01/17/2014 2120   CL 96 01/17/2014 2120   CO2 26 01/17/2014 2120   GLUCOSE 110* 01/17/2014 2120   BUN 26* 01/17/2014 2120   BUN 19 09/30/2013   CREATININE 1.37* 01/17/2014 2120   CREATININE 1.0 09/30/2013   CALCIUM 10.4 01/17/2014 2120   PROT 8.0 01/17/2014 2120   ALBUMIN 3.9 01/17/2014 2120   AST 33 01/17/2014 2120   ALT 16 01/17/2014 2120   ALKPHOS 73 01/17/2014 2120   BILITOT 0.4 01/17/2014 2120   GFRNONAA 33* 01/17/2014 2120   GFRAA 39* 01/17/2014 2120   Lipase     Component Value Date/Time   LIPASE 91* 01/17/2014 2120       Studies/Results: Ct Abdomen Pelvis W Contrast  01/17/2014   CLINICAL DATA:  Vomiting. Abdominal pain. History  of small bowel obstruction.  EXAM: CT ABDOMEN AND PELVIS WITH CONTRAST  TECHNIQUE: Multidetector CT imaging of the abdomen and pelvis was performed using the standard protocol following bolus administration of intravenous contrast.  CONTRAST:  37mL OMNIPAQUE IOHEXOL 300 MG/ML SOLN, 42mL OMNIPAQUE IOHEXOL 300 MG/ML SOLN  COMPARISON:  None.  FINDINGS: BODY WALL: Unremarkable.  LOWER CHEST: Cardiomegaly. Gastroesophageal reflux in the setting of gastric outlet obstruction.  ABDOMEN/PELVIS:  Liver: Numerous well circumscribed low-density lesions most consistent with cysts. No indication of malignancy history in the electronic medical record.  Biliary: Cholecystectomy.  Pancreas: Atrophy without acute finding.  Spleen: Incidental sub cm cyst or other lesion in the anterior subcapsular spleen.  Adrenals: Unremarkable.  Kidneys and ureters: Lobulation of both renal cortices consistent with scarring. Bilateral renal atrophy. No hydronephrosis.  Bladder: Unremarkable.  Reproductive: Unremarkable for age.  The pelvic floor appears low.  Bowel: The stomach is massively dilated to the level of the pylorus which is circumferentially thickened (single wall thickness of 15 mm). The mucosa is distinct at the level of the pylorus, with submucosal low-density favoring inflammatory edema. No evidence for perforation or pneumatosis. There is been in enteroenterostomy which is patent. This may be related to electronic medical  record history of small bowel obstruction. Sutures noted along the anterior gastric body, likely related to patient's history perforated gastric ulcer. Fluid levels present in the distal colon consistent with diarrhea. Colonic diverticulosis.  Retroperitoneum: No mass or adenopathy.  Peritoneum: No ascites or pneumoperitoneum.  Vascular: No acute abnormality.  OSSEOUS: No acute abnormalities. Remote appearing T11 compression fracture with approximate 50% height loss.  IMPRESSION: 1. High-grade gastric outlet  obstruction with pylorus appearance favoring peptic ulcer disease over mass. Follow-up endoscopy or upper GI suggested. 2. Numerous non acute findings are noted above.   Electronically Signed   By: Jorje Guild M.D.   On: 01/17/2014 22:54    Anti-infectives: Anti-infectives   None       Assessment/Plan GOO  S/P pervious gastrojejunostomy and now jejunojejunostomy due ot perforated ulcer, also had repair of incarcerated ventral hernia [facility] 09/2013) Elevated lipase  Plan: 1.  No operative reports available in care everywhere.  Only discharge/office notes available.  Based on available information would recommend that she be transferred back to [facility] for continuity of care with the surgeons there since this is a very complicated patient with complex surgical history.  She would be best served at a higher level of care and where she is already established. 2.  Pending EGD today 3.  Cont. Protonix/NG tube to LIWS - having trouble with it being clogged 4.  Ambulate and IS as able to aid in recovery 5.  On aspirin and lovenox    LOS: 2 days    Coralie Keens 01/19/2014, 7:51 AM Pager: [phone]

## 2014-01-19 NOTE — Progress Notes (Signed)
Patient interviewed and examined, agree with PA note above. Patient seen following EGD. This revealed a phytobezoar and no apparent significant gastric outlet obstruction with normal gastric anatomy. Patient now liquid diet. Some records indicate successful dissolution with [employer]. Would try this with liquid diet and followup endoscopy Edward Jolly MD, FACS  01/19/2014 4:21 PM

## 2014-01-19 NOTE — Progress Notes (Signed)
Patient ID: Shannon [name], female   DOB: [birth year], 80 y.o.   MRN: [id number] [facility] PROGRESS NOTE  Patches Montecino WUJ:[id number] DOB: 07/16/1923 DOA: 01/17/2014 PCP: Oneal Grout, MD  Brief narrative 80 year old female with past medical history of hypertension, dyslipidemia, diabetes, CKD stage 3, depression, hypothyroidism, history underwent exploratory laparotomy with EGD and resection of a gastrojejunal anastomosis with primary jejunojejunal anastomosis, repair of recurrent incisional hernia in 09/2013, atrial fibrillation, resolved. Patient presented to [facility] ED with complaints of sharp diffuse abdominal pain for past few days prior to this admission associated with nausea and vomiting. No reports of fevers or chills. No reports of blood in stool. On admission, patient was found to have gastric outlet obstruction as evidenced by CT abdomen. NG tube placed and GI and surgery seeing the patient in consultation. Plan for EGD this morning.  Assessment/Plan:   Principal Problem:  Gastric outlet obstruction  Continue NG tube suction, keep NPO. Plan for EGD today. May continue supportive care with IV fluids, antiemetics if needed. Appreciate GI and surgery following. With history of complicated surgeries per surgery recommendations better for pt to go to [facility] for continuity of care.  Active Problems:  Essential hypertension, benign  Continue hydralazine and metoprolol IV. BP this am 165/65 GERD (gastroesophageal reflux disease)  Continue protonix drip. Dyslipidemia  Statin on hold while pt is NPO. DM (diabetes mellitus) type II uncontrolled, periph vascular disorder  Continue SSI. Diabetic neuropathy  Gabapentin on hold while pt is NPO Hypothyroidism  Change levothyroxine to IV. Depression  Ativan PRN. Zoloft on hold since pt is NPO Anemia of chronic disease  Secondary to history of CKD.  Hemoglobin is stable.  No current indications for transfusion.  CKD (chronic kidney disease)  stage 3, GFR 30-59 ml/min  Baseline creatinine 1.5 and on this admission 1.37. Check BMP in am. DVT Prophylaxis  Lovenox subQ help prior to EGD   Code Status: Full.  Family Communication: plan of care discussed with the patient  Disposition Plan: Home when stable.    IV Access:   Peripheral IV Procedures and diagnostic studies:   Ct Abdomen Pelvis W Contrast 01/17/2014 1. High-grade gastric outlet obstruction with pylorus appearance favoring peptic ulcer disease over mass. Follow-up endoscopy or upper GI suggested. 2. Numerous non acute findings are noted above.  EGD planned for today 01/19/2014. Medical Consultants:   Surgery  Gastroenterology  Other Consultants:   None  Anti-Infectives:   None    Manson Passey, MD  [facility] Pager [phone]  If 7PM-7AM, please contact night-coverage [email or web address] Password [email or web address] 01/19/2014, 1:12 PM   LOS: 2 days    HPI/Subjective: No acute overnight events.  Objective: Filed Vitals:   01/19/14 1210 01/19/14 1215 01/19/14 1220 01/19/14 1253  BP: 149/53  156/56 132/55  Pulse: 80 77 78 69  Temp:    98 F (36.7 C)  TempSrc:      Resp: 21   20  Height:      Weight:      SpO2: 97%   100%    Intake/Output Summary (Last 24 hours) at 01/19/14 1312 Last data filed at 01/19/14 1000  Gross per 24 hour  Intake 2351.25 ml  Output   1300 ml  Net 1051.25 ml    Exam:   General:  Pt is sleeping this am, no distress  Cardiovascular: Regular rate and rhythm, S1/S2, no murmurs  Respiratory: no wheezing, no crackles, no rhonchi  Abdomen: distended, bowel sounds present; NG in place  Extremities: No edema, pulses DP and PT palpable bilaterally  Neuro: Grossly nonfocal  Data Reviewed: Basic Metabolic Panel:  Recent Labs Lab 01/17/14 2120  NA 134*  K 4.7  CL 96  CO2 26  GLUCOSE 110*  BUN 26*  CREATININE 1.37*  CALCIUM 10.4   Liver Function Tests:  Recent Labs Lab 01/17/14 2120  AST 33  ALT 16  ALKPHOS  73  BILITOT 0.4  PROT 8.0  ALBUMIN 3.9    Recent Labs Lab 01/17/14 2120  LIPASE 91*   No results found for this basename: AMMONIA,  in the last 168 hours CBC:  Recent Labs Lab 01/17/14 2120  WBC 6.9  NEUTROABS 3.8  HGB 11.1*  HCT 32.9*  MCV 90.1  PLT 227   Cardiac Enzymes: No results found for this basename: CKTOTAL, CKMB, CKMBINDEX, TROPONINI,  in the last 168 hours BNP: No components found with this basename: POCBNP,  CBG:  Recent Labs Lab 01/18/14 2317 01/19/14 0318 01/19/14 0711 01/19/14 1026 01/19/14 1255  GLUCAP 81 95 74 77 71    No results found for this or any previous visit (from the past 240 hour(s)).   Scheduled Meds: . aspirin EC  81 mg Oral Daily  . fluticasone  2 spray Each Nare Daily  . hydrALAZINE  10 mg Intravenous Q6H  . insulin aspart  0-9 Units Subcutaneous 6 times per day  . levothyroxine  87.5 mcg Intravenous Daily  . metoprolol  2.5 mg Intravenous 3 times per day  . [START ON 01/21/2014] pantoprazole (PROTONIX) IV  40 mg Intravenous Q12H   Continuous Infusions: . sodium chloride 75 mL/hr at 01/19/14 1303  . pantoprozole (PROTONIX) infusion 8 mg/hr (01/19/14 1303)

## 2014-01-20 ENCOUNTER — Inpatient Hospital Stay (HOSPITAL_COMMUNITY): Payer: Medicare Other | Admitting: Anesthesiology

## 2014-01-20 ENCOUNTER — Encounter (HOSPITAL_COMMUNITY)
Admission: EM | Disposition: A | Discharge status: Home / Self Care | Payer: Self-pay | Source: Home / Self Care | Attending: Internal Medicine

## 2014-01-20 ENCOUNTER — Encounter (HOSPITAL_COMMUNITY): Payer: Medicare Other | Admitting: Anesthesiology

## 2014-01-20 ENCOUNTER — Encounter (HOSPITAL_COMMUNITY): Payer: Self-pay | Admitting: Internal Medicine

## 2014-01-20 DIAGNOSIS — T182XXD Foreign body in stomach, subsequent encounter: Secondary | ICD-10-CM

## 2014-01-20 DIAGNOSIS — I10 Essential (primary) hypertension: Secondary | ICD-10-CM

## 2014-01-20 DIAGNOSIS — R0789 Other chest pain: Secondary | ICD-10-CM

## 2014-01-20 DIAGNOSIS — T182XXA Foreign body in stomach, initial encounter: Secondary | ICD-10-CM | POA: Diagnosis present

## 2014-01-20 HISTORY — PX: ESOPHAGOGASTRODUODENOSCOPY: SHX5428

## 2014-01-20 LAB — TROPONIN I
Troponin I: <0.3 ng/mL (ref <0.30)
Troponin I: 0.34 ng/mL (ref <0.30)

## 2014-01-20 LAB — CBC
HCT: 31.1 % — ABNORMAL LOW (ref 36.0–46.0)
Hemoglobin: 10.6 g/dL — ABNORMAL LOW (ref 12.0–15.0)
MCH: 30.7 pg (ref 26.0–34.0)
MCHC: 34.1 g/dL (ref 30.0–36.0)
MCV: 90.1 fL (ref 78.0–100.0)
Platelets: 235 10*3/uL (ref 150–400)
RBC: 3.45 MIL/uL — ABNORMAL LOW (ref 3.87–5.11)
RDW: 16.5 % — ABNORMAL HIGH (ref 11.5–15.5)
WBC: 7.6 10*3/uL (ref 4.0–10.5)

## 2014-01-20 LAB — MRSA PCR SCREENING: MRSA by PCR: NEGATIVE

## 2014-01-20 LAB — GLUCOSE, CAPILLARY
Glucose-Capillary: 104 mg/dL — ABNORMAL HIGH (ref 70–99)
Glucose-Capillary: 111 mg/dL — ABNORMAL HIGH (ref 70–99)
Glucose-Capillary: 112 mg/dL — ABNORMAL HIGH (ref 70–99)
Glucose-Capillary: 114 mg/dL — ABNORMAL HIGH (ref 70–99)
Glucose-Capillary: 128 mg/dL — ABNORMAL HIGH (ref 70–99)
Glucose-Capillary: 136 mg/dL — ABNORMAL HIGH (ref 70–99)
Glucose-Capillary: 173 mg/dL — ABNORMAL HIGH (ref 70–99)

## 2014-01-20 LAB — BASIC METABOLIC PANEL
Anion gap: 14 (ref 5–15)
BUN: 15 mg/dL (ref 6–23)
CO2: 19 mEq/L (ref 19–32)
Calcium: 9 mg/dL (ref 8.4–10.5)
Chloride: 105 mEq/L (ref 96–112)
Creatinine, Ser: 1.13 mg/dL — ABNORMAL HIGH (ref 0.50–1.10)
GFR calc Af Amer: 49 mL/min — ABNORMAL LOW (ref >90)
GFR calc non Af Amer: 42 mL/min — ABNORMAL LOW (ref >90)
Glucose, Bld: 121 mg/dL — ABNORMAL HIGH (ref 70–99)
Potassium: 4 mEq/L (ref 3.7–5.3)
Sodium: 138 mEq/L (ref 137–147)

## 2014-01-20 SURGERY — EGD (ESOPHAGOGASTRODUODENOSCOPY)
Anesthesia: Monitor Anesthesia Care

## 2014-01-20 MED ORDER — HYDRALAZINE HCL 50 MG PO TABS
50.0000 mg | ORAL_TABLET | Freq: Four times a day (QID) | ORAL | Status: DC
  Administered 2014-01-20 – 2014-01-22 (×8): 50 mg via ORAL
  Filled 2014-01-20 (×11): qty 1

## 2014-01-20 MED ORDER — PROPOFOL 10 MG/ML IV BOLUS
INTRAVENOUS | Status: AC
  Filled 2014-01-20: qty 20

## 2014-01-20 MED ORDER — METOPROLOL SUCCINATE ER 50 MG PO TB24: 50.0000 mg | ORAL_TABLET | Freq: Every day | ORAL | Status: DC

## 2014-01-20 MED ORDER — PROPOFOL 10 MG/ML IV BOLUS
INTRAVENOUS | Status: DC | PRN
  Administered 2014-01-20 (×2): 20 mg via INTRAVENOUS
  Administered 2014-01-20: 10 mg via INTRAVENOUS

## 2014-01-20 MED ORDER — LOSARTAN POTASSIUM 50 MG PO TABS
100.0000 mg | ORAL_TABLET | Freq: Every day | ORAL | Status: DC
  Administered 2014-01-20 – 2014-01-22 (×3): 100 mg via ORAL
  Filled 2014-01-20 (×3): qty 2

## 2014-01-20 MED ORDER — METOPROLOL TARTRATE 1 MG/ML IV SOLN
5.0000 mg | Freq: Once | INTRAVENOUS | Status: AC
  Administered 2014-01-20: 5 mg via INTRAVENOUS
  Filled 2014-01-20: qty 5

## 2014-01-20 MED ORDER — METOPROLOL SUCCINATE ER 25 MG PO TB24
50.0000 mg | ORAL_TABLET | Freq: Every day | ORAL | Status: DC
  Administered 2014-01-20 – 2014-01-22 (×3): 50 mg via ORAL
  Filled 2014-01-20 (×3): qty 2

## 2014-01-20 MED ORDER — BUTAMBEN-TETRACAINE-BENZOCAINE 2-2-14 % EX AERO
INHALATION_SPRAY | CUTANEOUS | Status: DC | PRN
  Administered 2014-01-20: 2 via TOPICAL

## 2014-01-20 MED ORDER — ISOSORBIDE MONONITRATE ER 30 MG PO TB24
30.0000 mg | ORAL_TABLET | Freq: Every day | ORAL | Status: DC
  Administered 2014-01-20 – 2014-01-21 (×2): 30 mg via ORAL
  Filled 2014-01-20 (×4): qty 1

## 2014-01-20 MED ORDER — CLONIDINE HCL 0.2 MG PO TABS
0.2000 mg | ORAL_TABLET | ORAL | Status: AC
  Administered 2014-01-20: 0.2 mg via ORAL
  Filled 2014-01-20: qty 1

## 2014-01-20 MED ORDER — LACTATED RINGERS IV SOLN
INTRAVENOUS | Status: DC
  Administered 2014-01-20: 1000 mL via INTRAVENOUS

## 2014-01-20 MED ORDER — CLONIDINE HCL ER 0.1 MG PO TB12
0.1000 mg | ORAL_TABLET | Freq: Once | ORAL | Status: AC
  Administered 2014-01-20: 0.1 mg via ORAL
  Filled 2014-01-20: qty 1

## 2014-01-20 MED ORDER — HYDRALAZINE HCL 20 MG/ML IJ SOLN: 10.0000 mg | Freq: Once | INTRAMUSCULAR | Status: DC

## 2014-01-20 MED ORDER — LACTATED RINGERS IV SOLN
INTRAVENOUS | Status: DC | PRN
  Administered 2014-01-20: 10:00:00 via INTRAVENOUS

## 2014-01-20 NOTE — Progress Notes (Signed)
Report give to [facility] in ICU Neta Mends RN 01-20-2014 17:09pm

## 2014-01-20 NOTE — Progress Notes (Signed)
Patient ID: Shannon [name], female   DOB: 1924-02-10, 80 y.o.   MRN: [id number]  [facility] PROGRESS NOTE  Shannon [name] HQI:[id number] DOB: 11/26/1923 DOA: 01/17/2014 PCP: Oneal Grout, MD  Brief narrative  80 year old female with past medical history of hypertension, dyslipidemia, diabetes, CKD stage 3, depression, hypothyroidism, history underwent exploratory laparotomy with EGD and resection of a gastrojejunal anastomosis with primary jejunojejunal anastomosis, repair of recurrent incisional hernia in 09/2013, atrial fibrillation, resolved. Patient presented to [facility] ED with complaints of sharp diffuse abdominal pain for past few days prior to this admission associated with nausea and vomiting. No reports of fevers or chills. No reports of blood in stool. On admission, patient was found to have gastric outlet obstruction as evidenced by CT abdomen. Pt underwent EGD 01/19/2014 with findings of erosive esophagitis and a large gastric bezoar replacing the proximal third of the stomach but no endoscopic evidence for surgical altered anatomy, stomach ulcer, or high-grade stenosis. Plan is now for endoscopy for today to try to remove or disrupt bezoar.  Assessment/Plan:   Principal Problem:  Gastric outlet obstruction   Patient required NG tube on admission She underwent EGD with  findings of erosive esophagitis and a large gastric bezoar replacing the proximal third of the stomach but no endoscopic evidence for surgical altered anatomy, stomach ulcer, or high-grade stenosis. Plan is now for endoscopy for today to try to remove or disrupt bezoar.  Appreciate GI following and their recommendations.   Hemoglobin is stable at 10.6.  Continue protonix drip and protonix 40 mg IV Q 12 hours.  NG tube is out.  Surgery also following.   Active Problems:  Essential hypertension, benign   Continue hydralazine and metoprolol IV.  GERD (gastroesophageal reflux disease)   Continue protonix drip and  protonix 40 mg IV Q 12 hours  Dyslipidemia   Statin on hold while pt is NPO. DM (diabetes mellitus) type II uncontrolled, periph vascular disorder   Continue SSI. Diabetic neuropathy   Gabapentin on hold while pt is NPO Hypothyroidism   Change levothyroxine to IV. Depression  Ativan PRN. Zoloft on hold since pt is NPO Anemia of chronic disease   Secondary to history of CKD.   Hemoglobin is stable.   No current indications for transfusion.  CKD (chronic kidney disease) stage 3, GFR 30-59 ml/min   Baseline creatinine 1.5 and on this admission 1.37. Creatinine is better this morning.  DVT Prophylaxis   Lovenox subQ help prior to EGD   Code Status: Full.  Family Communication: plan of care discussed with the patient  Disposition Plan: Home when stable.    IV Access:   Peripheral IV Procedures and diagnostic studies:   Ct Abdomen Pelvis W Contrast 01/17/2014 1. High-grade gastric outlet obstruction with pylorus appearance favoring peptic ulcer disease over mass. Follow-up endoscopy or upper GI suggested. 2. Numerous non acute findings are noted above.  EGD10/19/2015 1. Erosive esophagitis  2. Large gastric bezoar replacing the proximal third of the stomach  3. No endoscopic evidence for surgical altered anatomy, stomach ulcer, or high-grade stenosis  4. Question motility disorder. Question downstream process. Question what type of surgical procedure recently done and why Medical Consultants:   Surgery  Gastroenterology  Other Consultants:   None  Anti-Infectives:   None    Manson Passey, MD  [facility] Pager [phone]  If 7PM-7AM, please contact night-coverage [email or web address] Password [email or web address] 01/20/2014, 7:17 AM   LOS: 3 days    HPI/Subjective: No acute overnight events.  Objective: Filed Vitals:   01/19/14 2047 01/19/14 2200 01/20/14 0219 01/20/14 0522  BP: 177/79 163/75 148/52 174/71  Pulse: 79 83 84 79  Temp:  98.3 F (36.8 C)  98.3 F (36.8 C)   TempSrc:  Oral  Oral  Resp:  16  16  Height:      Weight:    70.6 kg (155 lb 10.3 oz)  SpO2:  97%  96%    Intake/Output Summary (Last 24 hours) at 01/20/14 0717 Last data filed at 01/20/14 0529  Gross per 24 hour  Intake 1901.25 ml  Output    800 ml  Net 1101.25 ml    Exam:   General:  Pt is alert, follows commands appropriately, not in acute distress  Cardiovascular: Regular rate and rhythm, S1/S2, no murmurs  Respiratory: no wheezing, no crackles, no rhonchi  Abdomen: Soft, non tender, non distended, bowel sounds present  Extremities: No edema, pulses DP and PT palpable bilaterally  Neuro: Grossly nonfocal  Data Reviewed: Basic Metabolic Panel:  Recent Labs Lab 01/17/14 2120 01/20/14 0510  NA 134* 138  K 4.7 4.0  CL 96 105  CO2 26 19  GLUCOSE 110* 121*  BUN 26* 15  CREATININE 1.37* 1.13*  CALCIUM 10.4 9.0   Liver Function Tests:  Recent Labs Lab 01/17/14 2120  AST 33  ALT 16  ALKPHOS 73  BILITOT 0.4  PROT 8.0  ALBUMIN 3.9    Recent Labs Lab 01/17/14 2120  LIPASE 91*   No results found for this basename: AMMONIA,  in the last 168 hours CBC:  Recent Labs Lab 01/17/14 2120 01/20/14 0510  WBC 6.9 7.6  NEUTROABS 3.8  --   HGB 11.1* 10.6*  HCT 32.9* 31.1*  MCV 90.1 90.1  PLT 227 235   Cardiac Enzymes: No results found for this basename: CKTOTAL, CKMB, CKMBINDEX, TROPONINI,  in the last 168 hours BNP: No components found with this basename: POCBNP,  CBG:  Recent Labs Lab 01/19/14 1255 01/19/14 1720 01/19/14 2023 01/20/14 0005 01/20/14 0414  GLUCAP 71 123* 115* 111* 114*    No results found for this or any previous visit (from the past 240 hour(s)).   Scheduled Meds: . antiseptic oral rinse  7 mL Mouth Rinse q12n4p  . aspirin EC  81 mg Oral Daily  . chlorhexidine  15 mL Mouth Rinse BID  . cycloSPORINE  1 drop Both Eyes BID  . fluticasone  2 spray Each Nare Daily  . hydrALAZINE  10 mg Intravenous Q6H  . insulin aspart   0-9 Units Subcutaneous 6 times per day  . levothyroxine  87.5 mcg Intravenous Daily  . metoprolol  2.5 mg Intravenous 3 times per day  . [START ON 01/21/2014] pantoprazole (PROTONIX) IV  40 mg Intravenous Q12H   Continuous Infusions: . sodium chloride 75 mL (01/19/14 2349)  . pantoprozole (PROTONIX) infusion 8 mg/hr (01/19/14 2348)

## 2014-01-20 NOTE — Progress Notes (Signed)
Notified MD Charlies Silvers regarding patient's blood pressure elevated order received for metoprolol and hydrlizine once, order entered Neta Mends RN 01-20-2014 12:06 PM

## 2014-01-20 NOTE — Transfer of Care (Signed)
Immediate Anesthesia Transfer of Care Note  Patient: Shannon [name]  Procedure(s) Performed: Procedure(s): ESOPHAGOGASTRODUODENOSCOPY (EGD) (N/A)  Patient Location: PACU and Endoscopy Unit  Anesthesia Type:MAC  Level of Consciousness: awake, oriented, lethargic and responds to stimulation  Airway & Oxygen Therapy: Patient Spontanous Breathing and Patient connected to nasal cannula oxygen  Post-op Assessment: Report given to PACU RN, Post -op Vital signs reviewed and stable and Patient moving all extremities  Post vital signs: Reviewed and stable  Complications: No apparent anesthesia complications

## 2014-01-20 NOTE — Progress Notes (Addendum)
Notified MD Charlies Silvers regarding patient's blood pressure elevated and patient with complaints of chest pain, MD gave order for ekg and troponin q6h, order entered Neta Mends RN 01-20-2014 15:22pm  Since BP still elevated at 190-200 range will place order for transfer to SDU. Reviewed EKG (sinus rhythm), given so far hydralazine 10 mg IV once, metoprolol 5 mg IV once, then clonidine total of 0.3 mg. Paged cardiology for input on management, awaiting response.  Leisa Lenz [facility] [phone]

## 2014-01-20 NOTE — Progress Notes (Signed)
Patient interviewed and examined, agree with PA note above. At successful breaking up of the bezoar and endoscopy. However as I am seeing her this afternoon she is having chest pain and significant hypertension and being transferred to the unit. Her abdomen is soft and nontender.    Edward Jolly MD, FACS  01/20/2014 5:44 PM

## 2014-01-20 NOTE — Progress Notes (Signed)
[facility] Progress Note  1 Day Post-Op  Subjective: Pt doing much better now that NG out of her nose.  Going down to EGD to see if they can break up her bezoar.  No N/V, abdominal pain improved.  Some flatus, small BM yesterday.  Objective: Vital signs in last 24 hours: Temp:  [98 F (36.7 C)-98.7 F (37.1 C)] 98.3 F (36.8 C) (10/20 0522) Pulse Rate:  [69-91] 79 (10/20 0522) Resp:  [8-21] 16 (10/20 0522) BP: (132-189)/(50-80) 174/71 mmHg (10/20 0522) SpO2:  [94 %-100 %] 96 % (10/20 0522) Weight:  [155 lb 10.3 oz (70.6 kg)] 155 lb 10.3 oz (70.6 kg) (10/20 0522) Last BM Date: 01/18/14  Intake/Output from previous day: 10/19 0701 - 10/20 0700 In: 1901.3 [I.V.:1761.3; NG/GT:40; IV Piggyback:100] Out: 800 [Urine:800] Intake/Output this shift:    PE: Gen:  Alert, NAD, pleasant Abd: Soft, distended, but not tender, +BS, no HSM   Lab Results:   Recent Labs  01/17/14 2120 01/20/14 0510  WBC 6.9 7.6  HGB 11.1* 10.6*  HCT 32.9* 31.1*  PLT 227 235   BMET  Recent Labs  01/17/14 2120 01/20/14 0510  NA 134* 138  K 4.7 4.0  CL 96 105  CO2 26 19  GLUCOSE 110* 121*  BUN 26* 15  CREATININE 1.37* 1.13*  CALCIUM 10.4 9.0   PT/INR  Recent Labs  01/17/14 2120  LABPROT 14.6  INR 1.12   CMP     Component Value Date/Time   NA 138 01/20/2014 0510   NA 127* 09/30/2013   K 4.0 01/20/2014 0510   CL 105 01/20/2014 0510   CO2 19 01/20/2014 0510   GLUCOSE 121* 01/20/2014 0510   BUN 15 01/20/2014 0510   BUN 19 09/30/2013   CREATININE 1.13* 01/20/2014 0510   CREATININE 1.0 09/30/2013   CALCIUM 9.0 01/20/2014 0510   PROT 8.0 01/17/2014 2120   ALBUMIN 3.9 01/17/2014 2120   AST 33 01/17/2014 2120   ALT 16 01/17/2014 2120   ALKPHOS 73 01/17/2014 2120   BILITOT 0.4 01/17/2014 2120   GFRNONAA 42* 01/20/2014 0510   GFRAA 49* 01/20/2014 0510   Lipase     Component Value Date/Time   LIPASE 91* 01/17/2014 2120       Studies/Results: No results  found.  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 No operative reports available in care everywhere. Only discharge/office notes available. Dr. Henrene Pastor attempting bezoar removal today via EGD 2. Cont. Protonix/NG tube to LIWS - having trouble with it being clogged  3. Ambulate and IS as able to aid in recovery  4.  On aspirin and lovenox 5.  No plans for surgically removing the bezoar at this time.  If surgery became necessary she should be transferred back to [facility]    LOS: 3 days    DORT, [facility] 01/20/2014, 7:36 AM Pager: [phone]

## 2014-01-20 NOTE — Anesthesia Postprocedure Evaluation (Signed)
  Anesthesia Post-op Note  Patient: Shannon [name]  Procedure(s) Performed: Procedure(s) (LRB): ESOPHAGOGASTRODUODENOSCOPY (EGD) (N/A)  Patient Location: PACU  Anesthesia Type: MAC  Level of Consciousness: awake and alert   Airway and Oxygen Therapy: Patient Spontanous Breathing  Post-op Pain: mild  Post-op Assessment: Post-op Vital signs reviewed, Patient's Cardiovascular Status Stable, Respiratory Function Stable, Patent Airway and No signs of Nausea or vomiting  Last Vitals:  Filed Vitals:   01/20/14 1206  BP: 212/83  Pulse: 78  Temp:   Resp: 20    Post-op Vital Signs: stable   Complications: No apparent anesthesia complications

## 2014-01-20 NOTE — Op Note (Signed)
[place], [id number]   ENDOSCOPY PROCEDURE REPORT  PATIENT: Shannon [name], Shannon [name]  MR#: [id number] BIRTHDATE: 1923-08-05 , 89  yrs. old GENDER: female ENDOSCOPIST: Eustace Quail, MD REFERRED BY:  Excell Seltzer, M.D. PROCEDURE DATE:  01/20/2014 PROCEDURE:  EGD w/ fb removal (gastric bezoar) ASA CLASS:     Class III INDICATIONS:  foreign body removal from stomach(gastric phytobezoar). MEDICATIONS: Monitored anesthesia care and Per Anesthesia TOPICAL ANESTHETIC: none  DESCRIPTION OF PROCEDURE: After the risks benefits and alternatives of the procedure were thoroughly explained, informed consent was obtained.  The Pentax Gastroscope [id number] endoscope was introduced through the mouth and advanced to the second portion of the duodenum , Without limitations.  The instrument was slowly withdrawn as the mucosa was fully examined.  EXAM:The esophagus revealed distal esophagitis and peptic stricture. Stomach revealed large phytobezoar in the proximal stomach.  The antrum, pylorus, and duodenum were normal.  Retroflexed views revealed bezoar. THERAPY: An endoscopic snare, high pressure water jet, and endoscope were used to break up the bezoar into smaller parts. Anticipate that these will pass through the pylorus and time without obstruction. The scope was then withdrawn from the patient and the procedure completed.  COMPLICATIONS: There were no immediate complications.  ENDOSCOPIC IMPRESSION: 1. Esophagitis with esophageal stricture 2. Gastric phytobezoar status pulverization with endoscopic techniques  RECOMMENDATIONS: 1. Liquid and pured diet only. Avoid fibrous vegetable matter 2. Continue daily PPI indefinitely 3. Anticipate that she can discharge the patient home tomorrow.  Discussed with patient and her daughter  REPEAT EXAM:  eSigned:  Eustace Quail, MD 01/20/2014 11:18 AM    CC:The Patient and Excell Seltzer,  MD

## 2014-01-20 NOTE — Consult Note (Signed)
Admit date: 01/17/2014 Referring Physician  Dr. Charlies Silvers Primary Physician Blanchie Serve, MD Primary Cardiologist  None Reason for Consultation  hypertension and chest pain  HPI: 80 year old female with diabetes, chronic kidney disease stage III with recent exploratory laparotomy with EGD and resection of gastro-jejunal anastomosis, with current difficult to control hypertension and chest pain. Troponin has been ordered. Blood pressure has been in the 123XX123 systolic range. She has been transferred to the ICU. Metoprolol 5 mg IV has been administered, clonidine 0.3 he has been administered. Hydralazine 10 mg IV has been administered. We were asked for suggestions on reducing blood pressure.  She underwent endoscopy at 10:54 AM.  Home medications include losartan 100, hydralazine 50, 4 times a day, isosorbide 30 mg a day, Toprol 50 once a day.    PMH:   Past Medical History  Diagnosis Date  . Pneumonia   . Thyroid disease   . Seizures   . Stroke   . Hyperlipidemia   . Hypertension   . Diabetes mellitus without complication   . Small bowel obstruction   . Ventral hernia with bowel obstruction     PSH:   Past Surgical History  Procedure Laterality Date  . Exploratory laparotomy  09-07-2013  . Cholecystectomy    . Tubal ligation    . Colectomy    . Hernia repair  2015  . Esophagogastroduodenoscopy N/A 01/19/2014    Procedure: ESOPHAGOGASTRODUODENOSCOPY (EGD);  Surgeon: Irene Shipper, MD;  Location: [facility];  Service: Endoscopy;  Laterality: N/A;   Allergies:  Review of patient's allergies indicates no known allergies. Prior to Admit Meds:   Prior to Admission medications   Medication Sig Start Date End Date Taking? Authorizing Provider  acetaminophen (TYLENOL EX ST ARTHRITIS PAIN) 500 MG tablet Take 500 mg by mouth every 6 (six) hours as needed (for arthritis pain).   Yes Historical Provider, MD  aspirin EC 81 MG tablet Take 1 tablet (81 mg total) by mouth daily.  12/26/13  Yes Eugenie Filler, MD  atorvastatin (LIPITOR) 10 MG tablet Take 10 mg by mouth daily at 6 PM.   Yes Historical Provider, MD  Calcium Carbonate (CALTRATE 600 PO) Take 2 capsules by mouth 2 (two) times daily.   Yes Historical Provider, MD  cycloSPORINE (RESTASIS) 0.05 % ophthalmic emulsion Place 1 drop into both eyes 2 (two) times daily.   Yes Historical Provider, MD  Dietary Management Product (VASCULERA) TABS Take 1 tablet by mouth daily.   Yes Historical Provider, MD  fluticasone (FLONASE) 50 MCG/ACT nasal spray Place 2 sprays into both nostrils daily.   Yes Historical Provider, MD  gabapentin (NEURONTIN) 300 MG capsule Take 300 mg by mouth 3 (three) times daily.   Yes Historical Provider, MD  hydrALAZINE (APRESOLINE) 50 MG tablet Take one tablet by mouth four times daily to control blood pressure 01/15/14  Yes Mahima Pandey, MD  isosorbide mononitrate (IMDUR) 30 MG 24 hr tablet Take 30 mg by mouth daily.   Yes Historical Provider, MD  l-methylfolate-B6-B12 Glade Stanford) 3-35-2 MG TABS Take one tablet by mouth once daily 01/09/14  Yes Mahima Pandey, MD  levothyroxine (SYNTHROID, LEVOTHROID) 175 MCG tablet Take 175 mcg by mouth daily before breakfast.   Yes Historical Provider, MD  losartan (COZAAR) 100 MG tablet Take 100 mg by mouth daily.   Yes Historical Provider, MD  magnesium oxide (MAG-OX) 400 MG tablet Take one tablet by mouth twice daily 01/09/14  Yes Mahima Pandey, MD  metoprolol succinate (TOPROL-XL) 50  MG 24 hr tablet Take 50 mg by mouth daily. Take with or immediately following a meal.   Yes Historical Provider, MD  Multiple Vitamins-Minerals (MULTIVITAMIN & MINERAL PO) Take 1 tablet by mouth daily.   Yes Historical Provider, MD  Multiple Vitamins-Minerals (MULTIVITAMIN WITH MINERALS) tablet Take 1 tablet by mouth daily.   Yes Historical Provider, MD  pantoprazole (PROTONIX) 40 MG tablet Take 40 mg by mouth daily.   Yes Historical Provider, MD  sennosides-docusate sodium  (SENOKOT-S) 8.6-50 MG tablet Take 1 tablet by mouth 2 (two) times daily.    Yes Historical Provider, MD  sertraline (ZOLOFT) 100 MG tablet Take 100 mg by mouth daily.   Yes Historical Provider, MD  sucralfate (CARAFATE) 1 G tablet Take 1 g by mouth 3 (three) times daily with meals.   Yes Historical Provider, MD  traMADol (ULTRAM) 50 MG tablet Take 50 mg by mouth every 12 (twelve) hours as needed for severe pain (pain).   Yes Historical Provider, MD   Fam HX:    Family History  Problem Relation Age of Onset  . Stroke Mother    Social HX:    History   Social History  . Marital Status: Married    Spouse Name: N/A    Number of Children: N/A  . Years of Education: N/A   Occupational History  . Not on file.   Social History Main Topics  . Smoking status: Never Smoker   . Smokeless tobacco: Not on file  . Alcohol Use: No  . Drug Use: No  . Sexual Activity: Not on file   Other Topics Concern  . Not on file   Social History Narrative   Married in [place], lives in one story house with 2 people and no pets   Occupation: ?   Has a living Will, [place], and doesn't have DNR   Was living with Husband (in frail health) in [place] until 09/2013.  After her [facility] admission they both came to live with daughter in [place]:  All 11 ROS were addressed and are negative except what is stated in the HPI   Physical Exam: Blood pressure 226/102, pulse 102, temperature 98.2 F (36.8 C), temperature source Oral, resp. rate 24, height 5\' 6"  (1.676 m), weight 155 lb 10.3 oz (70.6 kg), SpO2 95.00%.   General: Elderly in no acute distress Head: Eyes PERRLA, No xanthomas.   Normal cephalic and atramatic  Lungs:   Clear bilaterally to auscultation and percussion. Normal respiratory effort. No wheezes, no rales. Heart:   Mildly tachycardic S1 S2 Pulses are 2+ & equal. No murmur, rubs, gallops.  No carotid bruit. No JVD.  No abdominal bruits.  Abdomen: Bowel sounds are positive, abdomen soft  and non-tender without masses.  Msk:  Frail but normal tone for age. Extremities:  No clubbing, cyanosis or edema.  DP +1 Neuro: Alert and oriented X 3, non-focal, MAE x 4 GU: Deferred Rectal: Deferred Psych:  Good affect, responds appropriately      Labs: Lab Results  Component Value Date   WBC 7.6 01/20/2014   HGB 10.6* 01/20/2014   HCT 31.1* 01/20/2014   MCV 90.1 01/20/2014   PLT 235 01/20/2014     Recent Labs Lab 01/17/14 2120 01/20/14 0510  NA 134* 138  K 4.7 4.0  CL 96 105  CO2 26 19  BUN 26* 15  CREATININE 1.37* 1.13*  CALCIUM 10.4 9.0  PROT 8.0  --   BILITOT  0.4  --   ALKPHOS 73  --   ALT 16  --   AST 33  --   GLUCOSE 110* 121*    Recent Labs  01/20/14 1529  TROPONINI <0.30   Lab Results  Component Value Date   CHOL 110 12/24/2013   HDL 58 12/24/2013   LDLCALC 26 12/24/2013   TRIG 130 12/24/2013    EKG:  01/20/14-sinus rhythm, nonspecific ST-T wave changes, possible old septal infarct pattern Personally viewed.   Echocardiogram: 12/24/13-normal ejection fraction 55% with mild to moderate aortic regurgitation.  Troponin normal.  ASSESSMENT/PLAN:    80 year old female postop endoscopy today with difficult to control hypertension, chest pain.  1. Hypertension-when reviewing her MAR, she has not received her home medications losartan 100 mg, metoprolol 50 mg extended release,  isosorbide, and hydralazine.  My first suggestion is to go ahead and give her these medications and see how this affects her blood pressure over the next several hours. If necessary, it would be reasonable to give her an extra 50 mg of metoprolol extended release this evening as long as her heart rate is greater than 60. Another addition could be amlodipine 5 mg if necessary.  2. Chest pain-currently she is not complaining of any chest discomfort. This could be transient after endoscopy was performed. She does state that she is cold. EKG is unremarkable. We will continue to  monitor. Troponin thus far is normal.    Candee Furbish, MD  01/20/2014  5:26 PM

## 2014-01-20 NOTE — Progress Notes (Signed)
     [facility] Progress Note  Subjective:  Feels ok.  Did good with clear liquids and without NGT.  No nausea or vomiting.  Objective:  Vital signs in last 24 hours: Temp:  [98 F (36.7 C)-98.7 F (37.1 C)] 98.3 F (36.8 C) (10/20 0522) Pulse Rate:  [69-91] 76 (10/20 0814) Resp:  [8-21] 16 (10/20 0522) BP: (132-189)/(50-80) 188/73 mmHg (10/20 0814) SpO2:  [94 %-100 %] 96 % (10/20 0522) Weight:  [155 lb 10.3 oz (70.6 kg)] 155 lb 10.3 oz (70.6 kg) (10/20 0522) Last BM Date: 01/19/14 General:  Alert, Well-developed, in NAD Heart:  Regular rate and rhythm; no murmurs Pulm:  CTAB.  No W/R/R. Abdomen:  Soft, non-distended. Normal bowel sounds.  Non-tender.   Extremities:  Without edema. Neurologic:  Alert and  oriented x4;  grossly normal neurologically. Psych:  Alert and cooperative. Normal mood and affect.  Intake/Output from previous day: 10/19 0701 - 10/20 0700 In: 1901.3 [I.V.:1761.3; NG/GT:40; IV Piggyback:100] Out: 800 [Urine:800]  Lab Results:  Recent Labs  01/17/14 2120 01/20/14 0510  WBC 6.9 7.6  HGB 11.1* 10.6*  HCT 32.9* 31.1*  PLT 227 235   BMET  Recent Labs  01/17/14 2120 01/20/14 0510  NA 134* 138  K 4.7 4.0  CL 96 105  CO2 26 19  GLUCOSE 110* 121*  BUN 26* 15  CREATININE 1.37* 1.13*  CALCIUM 10.4 9.0   LFT  Recent Labs  01/17/14 2120  PROT 8.0  ALBUMIN 3.9  AST 33  ALT 16  ALKPHOS 73  BILITOT 0.4   PT/INR  Recent Labs  01/17/14 2120  LABPROT 14.6  INR 1.12   Assessment / Plan: *Gastric bezoar causing GOO type symptoms:  Seen on EGD 10/19.   *S/p oversew repair of ulcer at [place] junction 09/2013. On chronic Carafate and Protonix since then. *S/p 09/2013 laparotomy and repair of incarcerated ventral hernia.  *Mildly elevated Lipase, normal LFTs, low-density liver lesions c/w cysts. Prior cholecystectomy and normal biliary tree.    -Repeat EGD later today to try to disrupt/remove bezoar. -Per surgery recs, she  should return to [facility] if she should require any further surgery.    LOS: 3 days   ZEHR, JESSICA D.  01/20/2014, 8:46 AM  Pager number [phone]   GI ATTENDING   Plans for endoscopy with disruption/removal of bezoar.   Docia Chuck. Geri Seminole., M.D. [facility] Division of Gastroenterology

## 2014-01-20 NOTE — Significant Event (Signed)
Rec'd call from Lab at 2158 with Critical Triponin of 0.34ng/ml. Paged [place] on call K. Knorr with results at 2201. Rec'd returned call at 2204. Discussed. Requested Cardiology to be notified as well. Paged Cardiology at [phone]. Rec'd return call from Dr. Claiborne Billings at 2222. Discussed results and pt presentation. No new orders at this time. Will continue to monitor and notify if any further changes in pt status.  Thalia Party, RN

## 2014-01-20 NOTE — Progress Notes (Signed)
Order from MD Charlies Silvers for clonidine 0.1mg  once Neta Mends RN 01-20-2014 3:01 PM

## 2014-01-20 NOTE — Progress Notes (Signed)
Called to notified patient's daughter Mrs.Armstrong that patient would be transferred to [place] no answer,left voicemail for daughter to callback Neta Mends RN 01-20-2014 4:24 PM

## 2014-01-21 ENCOUNTER — Encounter (HOSPITAL_COMMUNITY): Payer: Self-pay | Admitting: Internal Medicine

## 2014-01-21 DIAGNOSIS — I248 Other forms of acute ischemic heart disease: Secondary | ICD-10-CM

## 2014-01-21 DIAGNOSIS — E1143 Type 2 diabetes mellitus with diabetic autonomic (poly)neuropathy: Secondary | ICD-10-CM

## 2014-01-21 LAB — GLUCOSE, CAPILLARY
Glucose-Capillary: 103 mg/dL — ABNORMAL HIGH (ref 70–99)
Glucose-Capillary: 108 mg/dL — ABNORMAL HIGH (ref 70–99)
Glucose-Capillary: 111 mg/dL — ABNORMAL HIGH (ref 70–99)
Glucose-Capillary: 126 mg/dL — ABNORMAL HIGH (ref 70–99)
Glucose-Capillary: 92 mg/dL (ref 70–99)

## 2014-01-21 LAB — TROPONIN I: Troponin I: <0.3 ng/mL (ref <0.30)

## 2014-01-21 MED ORDER — ATORVASTATIN CALCIUM 10 MG PO TABS
10.0000 mg | ORAL_TABLET | Freq: Every day | ORAL | Status: DC
  Administered 2014-01-21: 10 mg via ORAL
  Filled 2014-01-21 (×2): qty 1

## 2014-01-21 MED ORDER — LEVOTHYROXINE SODIUM 175 MCG PO TABS
175.0000 ug | ORAL_TABLET | Freq: Every day | ORAL | Status: DC
  Administered 2014-01-21 – 2014-01-22 (×2): 175 ug via ORAL
  Filled 2014-01-21 (×2): qty 1

## 2014-01-21 MED ORDER — SODIUM CHLORIDE 0.9 % IV SOLN: INTRAVENOUS | Status: DC

## 2014-01-21 MED ORDER — GABAPENTIN 300 MG PO CAPS
300.0000 mg | ORAL_CAPSULE | Freq: Three times a day (TID) | ORAL | Status: DC
  Administered 2014-01-21 – 2014-01-22 (×4): 300 mg via ORAL
  Filled 2014-01-21 (×5): qty 1

## 2014-01-21 MED ORDER — SERTRALINE HCL 100 MG PO TABS
100.0000 mg | ORAL_TABLET | Freq: Every day | ORAL | Status: DC
  Administered 2014-01-21 – 2014-01-22 (×2): 100 mg via ORAL
  Filled 2014-01-21 (×2): qty 1

## 2014-01-21 NOTE — Care Management Note (Addendum)
    Page 1 of 2   01/22/2014     12:12:39 PM CARE MANAGEMENT NOTE 01/22/2014  Patient:  Shannon [name], Shannon [name]   Account Number:  [id number]  Date Initiated:  01/21/2014  Documentation initiated by:  Dessa Phi  Subjective/Objective Assessment:   80 Y/O F ADMITTED W/GASTRIC OUTLET OBSTRUCTION.READMIT 9/22-9/25/15.     Action/Plan:   FROM HOME.   Anticipated DC Date:  01/22/2014   Anticipated DC Plan:  [place]  CM consult      Choice offered to / List presented to:  C-4 Adult Children        [place] arranged  HH-1 RN  [place]      [place] agency  [place]   Status of service:  Completed, signed off Medicare Important Message given?  YES (If response is "NO", the following Medicare IM given date fields will be blank) Date Medicare IM given:  01/22/2014 Medicare IM given by:  [facility] Date Additional Medicare IM given:   Additional Medicare IM given by:    Discharge Disposition:  [place]  Per UR Regulation:  Reviewed for med. necessity/level of care/duration of stay  If discussed at [place] of Stay Meetings, dates discussed:   01/22/2014    Comments:  01/22/14 Porschia Willbanks RN,BSN NCM [phone] SPOKE TO DTR [name] IN RM ABOUT [facility] CHOSEN.TC [facility] TEL#[phone] INFORMED [facility] OF REFERRAL & D/C TODAY-FAXED W/CONFIRMATION TO [phone]- H&P,FACE SHEET,D/C SUMMARY,HHC ORDERS, & FACE TO FACE,THEY ARE AWARE OF D/C TODAY.  01/21/14 Makayleigh Poliquin RN,BSN NCM [phone] TRANSFER FROM SDU.FULLS,IV PROTONIX,IV DILAUDID PRN.AWAIT PT RECOMMENDATIONS.

## 2014-01-21 NOTE — Progress Notes (Addendum)
Patient ID: Santiago Linen, female   DOB: 09-17-1923, 80 y.o.   MRN: [id number]  [facility] PROGRESS NOTE  Dynesha Zelmer GEX:[id number] DOB: May 13, 1923 DOA: 01/17/2014 PCP: Oneal Grout, MD  Brief narrative  80 year old female with past medical history of hypertension, dyslipidemia, diabetes, CKD stage 3, depression, hypothyroidism, history underwent exploratory laparotomy with EGD and resection of a gastrojejunal anastomosis with primary jejunojejunal anastomosis, repair of recurrent incisional hernia in 09/2013, atrial fibrillation, resolved. Patient presented to [facility] ED with complaints of sharp diffuse abdominal pain for past few days prior to this admission associated with nausea and vomiting. No reports of fevers or chills. No reports of blood in stool. On admission, patient was found to have gastric outlet obstruction as evidenced by CT abdomen. Pt underwent EGD 01/19/2014 with findings of erosive esophagitis and a large gastric bezoar replacing the proximal third of the stomach but no endoscopic evidence for surgical altered anatomy, stomach ulcer, or high-grade stenosis. Plan is now for endoscopy for today to try to remove or disrupt bezoar.   Assessment/Plan:   Principal Problem:  Gastric outlet obstruction  Patient required NG tube on admission She underwent EGD with findings of erosive esophagitis and a large gastric bezoar replacing the proximal third of the stomach but no endoscopic evidence for surgical altered anatomy, stomach ulcer, or high-grade stenosis.  EGD 10/20 --> Esophagitis with esophageal stricture, gastric phytobezoar status post pulverization  Appreciate GI assistance, recommending liquid diet only. Avoid fibrous vegetable matter, continue daily PPI indefinitely  No signs of active bleeding, check CBC in AM Active Problems:  Essential hypertension, benign  BP improved with restarting home medical regimen: Imdur, Hydralazine, Losartan, Metoprolol Continue same  regimen GERD (gastroesophageal reflux disease)  Continue PPI Dyslipidemia  Resume statin upon discharge  DM (diabetes mellitus) type II uncontrolled, peripheral vascular disorder  Continue SSI. Diabetic neuropathy  Continue neurontin as per home medical regimen  Hypothyroidism  Continue synthroid, change to PO  Depression  Ativan PRN. Resume zoloft  Anemia of chronic disease  Secondary to history of CKD.  No current indications for transfusion.  CBC in AM CKD (chronic kidney disease) stage 3, GFR 30-59 ml/min  Baseline creatinine 1.5 and on this admission 1.37. BMP in AM DVT Prophylaxis  Lovenox SQ  Code Status: Full.  Family Communication: plan of care discussed with the patient  Disposition Plan: transfer to telemetry bed, possible d/c in am  IV Access:   Peripheral IV Procedures and diagnostic studies:   Ct Abdomen Pelvis W Contrast 01/17/2014 1. High-grade gastric outlet obstruction with pylorus appearance favoring peptic ulcer disease over mass. Follow-up endoscopy or upper GI suggested. 2. Numerous non acute findings are noted above.  EGD10/19/2015 1. Erosive esophagitis 2. 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 No endoscopic evidence for surgical altered anatomy, stomach ulcer, or high-grade stenosis 4. Question motility disorder. Question downstream process. Question what type of surgical procedure recently done and why Medical Consultants:   Surgery  Gastroenterology  Cardiology  Other Consultants:   None  Anti-Infectives:   None    Manson Passey, MD  [facility] Pager [phone]  If 7PM-7AM, please contact night-coverage [email or web address] Password [email or web address] 01/21/2014, 8:29 AM   LOS: 4 days    HPI/Subjective: No acute overnight events.  Objective: Filed Vitals:   01/21/14 0400 01/21/14 0500 01/21/14 0600 01/21/14 0700  BP: 147/52 132/44 135/52 168/72  Pulse: 71 67 68 81  Temp: 98.5 F (36.9 C)     TempSrc:  Oral      Resp: 17 19 20 19   Height:      Weight: 72.4 kg (159 lb 9.8 oz)     SpO2: 98% 97% 97% 98%    Intake/Output Summary (Last 24 hours) at 01/21/14 0829 Last data filed at 01/21/14 1610  Gross per 24 hour  Intake 3778.75 ml  Output   1900 ml  Net 1878.75 ml    Exam:   General:  Pt is alert, follows commands appropriately, not in acute distress  Cardiovascular: Regular rate and rhythm, S1/S2, no murmurs  Respiratory: Clear to auscultation bilaterally, no wheezing, no crackles, no rhonchi  Abdomen: Soft, non tender, non distended, bowel sounds present  Extremities: No edema, pulses DP and PT palpable bilaterally  Neuro: Grossly nonfocal  Data Reviewed: Basic Metabolic Panel:  Recent Labs Lab 01/17/14 2120 01/20/14 0510  NA 134* 138  K 4.7 4.0  CL 96 105  CO2 26 19  GLUCOSE 110* 121*  BUN 26* 15  CREATININE 1.37* 1.13*  CALCIUM 10.4 9.0   Liver Function Tests:  Recent Labs Lab 01/17/14 2120  AST 33  ALT 16  ALKPHOS 73  BILITOT 0.4  PROT 8.0  ALBUMIN 3.9    Recent Labs Lab 01/17/14 2120  LIPASE 91*   CBC:  Recent Labs Lab 01/17/14 2120 01/20/14 0510  WBC 6.9 7.6  NEUTROABS 3.8  --   HGB 11.1* 10.6*  HCT 32.9* 31.1*  MCV 90.1 90.1  PLT 227 235   Cardiac Enzymes:  Recent Labs Lab 01/20/14 1529 01/20/14 2111 01/21/14 0311  TROPONINI <0.30 0.34* <0.30   BNP: No components found with this basename: POCBNP,  CBG:  Recent Labs Lab 01/20/14 1556 01/20/14 1950 01/20/14 2315 01/21/14 0313 01/21/14 0806  GLUCAP 128* 173* 136* 126* 111*    Recent Results (from the past 240 hour(s))  MRSA PCR SCREENING     Status: None   Collection Time    01/20/14  5:28 PM      Result Value Ref Range Status   MRSA by PCR NEGATIVE  NEGATIVE Final   Comment:            The GeneXpert MRSA Assay (FDA     approved for NASAL specimens     only), is one component of a     comprehensive MRSA colonization     surveillance program. It is not      intended to diagnose MRSA     infection nor to guide or     monitor treatment for     MRSA infections.     Performed at [facility]     Scheduled Meds: . antiseptic oral rinse  7 mL Mouth Rinse q12n4p  . aspirin EC  81 mg Oral Daily  . chlorhexidine  15 mL Mouth Rinse BID  . cycloSPORINE  1 drop Both Eyes BID  . fluticasone  2 spray Each Nare Daily  . hydrALAZINE  50 mg Oral 4 times per day  . insulin aspart  0-9 Units Subcutaneous 6 times per day  . isosorbide mononitrate  30 mg Oral Daily  . levothyroxine  87.5 mcg Intravenous Daily  . losartan  100 mg Oral Daily  . metoprolol succinate  50 mg Oral Daily  . pantoprazole (PROTONIX) IV  40 mg Intravenous Q12H   Continuous Infusions: . sodium chloride 75 mL/hr at 01/21/14 0604  . sodium chloride

## 2014-01-21 NOTE — Progress Notes (Signed)
     [facility] Progress Note  Subjective:  Transferred to SD unit last night due to very high BP and complaints of chest pain.  Seen by cards; troponins ok.  No more chest pain this AM.  No nausea/vomiting.  Having BM's and passing flatus.  No abdominal pain.  Objective:  Vital signs in last 24 hours: Temp:  [97.4 F (36.3 C)-99.5 F (37.5 C)] 98.6 F (37 C) (10/21 0800) Pulse Rate:  [65-109] 80 (10/21 0900) Resp:  [17-31] 18 (10/21 0900) BP: (132-238)/(44-166) 134/47 mmHg (10/21 0800) SpO2:  [92 %-100 %] 97 % (10/21 0900) Weight:  [158 lb 11.7 oz (72 kg)-159 lb 9.8 oz (72.4 kg)] 159 lb 9.8 oz (72.4 kg) (10/21 0400) Last BM Date: 01/20/14 General:  Alert, Well-developed, in NAD Heart:  Regular rate and rhythm; no murmurs Pulm:  CTAB.  No W/R/R. Abdomen:  Soft, non-distended. Normal bowel sounds.  Non-tender.  Extremities:  Without edema. Neurologic:  Alert and  oriented x4;  grossly normal neurologically. Psych:  Alert and cooperative. Normal mood and affect.  Intake/Output from previous day: 10/20 0701 - 10/21 0700 In: 3778.8 [P.O.:300; I.V.:3478.8] Out: 1900 [Urine:1900]  Lab Results:  Recent Labs  01/20/14 0510  WBC 7.6  HGB 10.6*  HCT 31.1*  PLT 235   BMET  Recent Labs  01/20/14 0510  NA 138  K 4.0  CL 105  CO2 19  GLUCOSE 121*  BUN 15  CREATININE 1.13*  CALCIUM 9.0   Assessment / Plan: *Gastric bezoar causing GOO type symptoms: S/p EGD 10/20 with bezoar pulverization. *Esophagitis and esophageal stricture seen on EGD 10/20 as well *S/p oversew repair of ulcer at [place] junction 09/2013. On chronic Carafate and Protonix since then.  *S/p 09/2013 laparotomy and repair of incarcerated ventral hernia.   **Liquid and pureed diet only; no vegetable material. **Continue daily PPI indefinitely. **Will sign off from GI standpoint.  Call with questions.   LOS: 4 days   ZEHR, JESSICA D.  01/21/2014, 9:16 AM  Pager number [phone]  GI  ATTENDING   Interval data and events reviewed. Patient seen and examined. Agree with progress note as described above. The primary problem is gastroparesis. Phytobezoar polarized. Should pass spontaneously with time. Her diet needs to consist of liquids and pured foods with avoidance of straining vegetables. Reviewed with daughter and patient. Will sign off.  Docia Chuck. Geri Seminole., M.D. [facility] Division of Gastroenterology

## 2014-01-21 NOTE — Progress Notes (Signed)
Patient ID: Shannon [name], female   DOB: December 16, 1923, 80 y.o.   MRN: [id number]     [facility]      [address]   [place], [place] [address]    Phone: [phone] FAX: [phone]     Subjective: Pt had CP last night, pain free now.  No n/v.  Had a BM, passing flatus.  Denies abdominal pain.    Objective:  Vital signs:  Filed Vitals:   01/21/14 0400 01/21/14 0500 01/21/14 0600 01/21/14 0700  BP: 147/52 132/44 135/52 168/72  Pulse: 71 67 68 81  Temp: 98.5 F (36.9 C)     TempSrc: Oral     Resp: 17 19 20 19   Height:      Weight: 159 lb 9.8 oz (72.4 kg)     SpO2: 98% 97% 97% 98%    Last BM Date: 01/19/14  Intake/Output   Yesterday:  10/20 0701 - 10/21 0700 In: 3778.8 [P.O.:300; I.V.:3478.8] Out: 1900 [Urine:1900] This shift:    I/O last 3 completed shifts: In: 4651.3 [P.O.:300; I.V.:4351.3] Out: 2400 [Urine:2400]    Physical Exam: General: Pt awake/alert/oriented x4 in no acute distress Abdomen: +BS.  Soft.  Nondistended.  Mild ttp epigastric region.  No evidence of peritonitis.  No incarcerated hernias.   Problem List:   Principal Problem:   Gastric outlet obstruction Active Problems:   Essential hypertension, benign   GERD (gastroesophageal reflux disease)   Dyslipidemia   DM (diabetes mellitus) type II uncontrolled, periph vascular disorder   Diabetic neuropathy   Hypothyroidism   Depression   Anemia of chronic disease   CKD (chronic kidney disease) stage 3, GFR 30-59 ml/min   Gastric bezoar    Results:   Labs: Results for orders placed during the hospital encounter of 01/17/14 (from the past 48 hour(s))  GLUCOSE, CAPILLARY     Status: None   Collection Time    01/19/14 10:26 AM      Result Value Ref Range   Glucose-Capillary 77  70 - 99 mg/dL  GLUCOSE, CAPILLARY     Status: None   Collection Time    01/19/14 12:55 PM      Result Value Ref Range   Glucose-Capillary 71  70 - 99 mg/dL  GLUCOSE,  CAPILLARY     Status: Abnormal   Collection Time    01/19/14  5:20 PM      Result Value Ref Range   Glucose-Capillary 123 (*) 70 - 99 mg/dL  GLUCOSE, CAPILLARY     Status: Abnormal   Collection Time    01/19/14  8:23 PM      Result Value Ref Range   Glucose-Capillary 115 (*) 70 - 99 mg/dL  GLUCOSE, CAPILLARY     Status: Abnormal   Collection Time    01/20/14 12:05 AM      Result Value Ref Range   Glucose-Capillary 111 (*) 70 - 99 mg/dL  GLUCOSE, CAPILLARY     Status: Abnormal   Collection Time    01/20/14  4:14 AM      Result Value Ref Range   Glucose-Capillary 114 (*) 70 - 99 mg/dL  BASIC METABOLIC PANEL     Status: Abnormal   Collection Time    01/20/14  5:10 AM      Result Value Ref Range   Sodium 138  137 - 147 mEq/L   Potassium 4.0  3.7 - 5.3 mEq/L   Chloride 105  96 - 112 mEq/L  CO2 19  19 - 32 mEq/L   Glucose, Bld 121 (*) 70 - 99 mg/dL   BUN 15  6 - 23 mg/dL   Creatinine, Ser 6.21 (*) 0.50 - 1.10 mg/dL   Calcium 9.0  8.4 - 30.8 mg/dL   GFR calc non Af Amer 42 (*) >90 mL/min   GFR calc Af Amer 49 (*) >90 mL/min   Comment: (NOTE)     The eGFR has been calculated using the CKD EPI equation.     This calculation has not been validated in all clinical situations.     eGFR's persistently <90 mL/min signify possible Chronic Kidney     Disease.   Anion gap 14  5 - 15  CBC     Status: Abnormal   Collection Time    01/20/14  5:10 AM      Result Value Ref Range   WBC 7.6  4.0 - 10.5 K/uL   RBC 3.45 (*) 3.87 - 5.11 MIL/uL   Hemoglobin 10.6 (*) 12.0 - 15.0 g/dL   HCT 65.7 (*) 84.6 - 96.2 %   MCV 90.1  78.0 - 100.0 fL   MCH 30.7  26.0 - 34.0 pg   MCHC 34.1  30.0 - 36.0 g/dL   RDW 95.2 (*) 84.1 - 32.4 %   Platelets 235  150 - 400 K/uL  GLUCOSE, CAPILLARY     Status: Abnormal   Collection Time    01/20/14  7:33 AM      Result Value Ref Range   Glucose-Capillary 112 (*) 70 - 99 mg/dL  GLUCOSE, CAPILLARY     Status: Abnormal   Collection Time    01/20/14 12:36 PM       Result Value Ref Range   Glucose-Capillary 104 (*) 70 - 99 mg/dL  TROPONIN I     Status: None   Collection Time    01/20/14  3:29 PM      Result Value Ref Range   Troponin I <0.30  <0.30 ng/mL   Comment:            Due to the release kinetics of cTnI,     a negative result within the first hours     of the onset of symptoms does not rule out     myocardial infarction with certainty.     If myocardial infarction is still suspected,     repeat the test at appropriate intervals.  GLUCOSE, CAPILLARY     Status: Abnormal   Collection Time    01/20/14  3:56 PM      Result Value Ref Range   Glucose-Capillary 128 (*) 70 - 99 mg/dL  MRSA PCR SCREENING     Status: None   Collection Time    01/20/14  5:28 PM      Result Value Ref Range   MRSA by PCR NEGATIVE  NEGATIVE   Comment:            The GeneXpert MRSA Assay (FDA     approved for NASAL specimens     only), is one component of a     comprehensive MRSA colonization     surveillance program. It is not     intended to diagnose MRSA     infection nor to guide or     monitor treatment for     MRSA infections.     Performed at [facility]  GLUCOSE, CAPILLARY     Status: Abnormal   Collection Time  01/20/14  7:50 PM      Result Value Ref Range   Glucose-Capillary 173 (*) 70 - 99 mg/dL   Comment 1 Documented in Chart     Comment 2 Notify RN    TROPONIN I     Status: Abnormal   Collection Time    01/20/14  9:11 PM      Result Value Ref Range   Troponin I 0.34 (*) <0.30 ng/mL   Comment:            Due to the release kinetics of cTnI,     a negative result within the first hours     of the onset of symptoms does not rule out     myocardial infarction with certainty.     If myocardial infarction is still suspected,     repeat the test at appropriate intervals.     CRITICAL RESULT CALLED TO, READ BACK BY AND VERIFIED WITH:     C MIRETT RN [id number] 01/20/14 A NAVARRO  GLUCOSE, CAPILLARY     Status: Abnormal   Collection  Time    01/20/14 11:15 PM      Result Value Ref Range   Glucose-Capillary 136 (*) 70 - 99 mg/dL   Comment 1 Documented in Chart     Comment 2 Notify RN    TROPONIN I     Status: None   Collection Time    01/21/14  3:11 AM      Result Value Ref Range   Troponin I <0.30  <0.30 ng/mL   Comment:            Due to the release kinetics of cTnI,     a negative result within the first hours     of the onset of symptoms does not rule out     myocardial infarction with certainty.     If myocardial infarction is still suspected,     repeat the test at appropriate intervals.  GLUCOSE, CAPILLARY     Status: Abnormal   Collection Time    01/21/14  3:13 AM      Result Value Ref Range   Glucose-Capillary 126 (*) 70 - 99 mg/dL   Comment 1 Documented in Chart     Comment 2 Notify RN    GLUCOSE, CAPILLARY     Status: Abnormal   Collection Time    01/21/14  8:06 AM      Result Value Ref Range   Glucose-Capillary 111 (*) 70 - 99 mg/dL    Imaging / Studies: No results found.  Medications / Allergies:  Scheduled Meds: . antiseptic oral rinse  7 mL Mouth Rinse q12n4p  . aspirin EC  81 mg Oral Daily  . chlorhexidine  15 mL Mouth Rinse BID  . cycloSPORINE  1 drop Both Eyes BID  . fluticasone  2 spray Each Nare Daily  . hydrALAZINE  50 mg Oral 4 times per day  . insulin aspart  0-9 Units Subcutaneous 6 times per day  . isosorbide mononitrate  30 mg Oral Daily  . levothyroxine  87.5 mcg Intravenous Daily  . losartan  100 mg Oral Daily  . metoprolol succinate  50 mg Oral Daily  . pantoprazole (PROTONIX) IV  40 mg Intravenous Q12H   Continuous Infusions: . sodium chloride 75 mL/hr at 01/21/14 0604  . sodium chloride     PRN Meds:.acetaminophen, acetaminophen, HYDROmorphone (DILAUDID) injection, LORazepam, ondansetron (ZOFRAN) IV  Antibiotics: Anti-infectives   None  Assessment/Plan GOO secondary to food bezoar  S/P pervious gastrojejunostomy and now jejunojejunostomy due ot  perforated ulcer, also had repair of incarcerated ventral hernia [employer] 09/2013)  Elevated lipase   S/p endoscopy 10/20 shows esophagitis with esophageal stricture, bezoar s/p pulverization  GI recommends clears, advance per their recommendations No surgical indications  Surgery is signing off, please do not hesitate to contact [facility] with  Questions or concerns.   Ashok Norris, [facility] Pager [phone](7A-4:[phone])   01/21/2014 8:57 AM

## 2014-01-21 NOTE — Progress Notes (Signed)
Patient c/o of heaviness under rib cage on right side. Pt also c/o mild SOB. EKG performed. BP 172/84, Oxygen sats 92% on RA. Patient placed on 2 L [place]. Reece Levy, MD notified. New order placed. Will continue to monitor closely.

## 2014-01-21 NOTE — Progress Notes (Signed)
Gave report to [employer] on 4E

## 2014-01-21 NOTE — Progress Notes (Addendum)
Subjective:  No CP currently. "stomach a little upset". No SOB   Objective:  Vital Signs in the last 24 hours: Temp:  [97.4 F (36.3 C)-99.5 F (37.5 C)] 98.6 F (37 C) (10/21 0800) Pulse Rate:  [65-109] 80 (10/21 0900) Resp:  [17-31] 18 (10/21 0900) BP: (132-238)/(44-166) 134/47 mmHg (10/21 0800) SpO2:  [92 %-100 %] 97 % (10/21 0900) Weight:  [158 lb 11.7 oz (72 kg)-159 lb 9.8 oz (72.4 kg)] 159 lb 9.8 oz (72.4 kg) (10/21 0400)  Intake/Output from previous day: 10/20 0701 - 10/21 0700 In: 3778.8 [P.O.:300; I.V.:3478.8] Out: 1900 [Urine:1900]   Physical Exam: General: Elderly, in no acute distress. Head:  Normocephalic and atraumatic. Lungs: Clear to auscultation and percussion. Heart: Normal S1 and S2.  Soft systolic/diastolic RUSB murmur, no rubs or gallops.  Abdomen: soft, non-tender, positive bowel sounds. Extremities: No clubbing or cyanosis. No edema. Neurologic: Alert.    Lab Results:  Recent Labs  01/20/14 0510  WBC 7.6  HGB 10.6*  PLT 235    Recent Labs  01/20/14 0510  NA 138  K 4.0  CL 105  CO2 19  GLUCOSE 121*  BUN 15  CREATININE 1.13*    Recent Labs  01/20/14 2111 01/21/14 0311  TROPONINI 0.34* <0.30    Telemetry: NSR Personally viewed.   EKG:  No ST changes  Cardiac Studies:  EF 55%  Scheduled Meds: . antiseptic oral rinse  7 mL Mouth Rinse q12n4p  . aspirin EC  81 mg Oral Daily  . chlorhexidine  15 mL Mouth Rinse BID  . cycloSPORINE  1 drop Both Eyes BID  . fluticasone  2 spray Each Nare Daily  . gabapentin  300 mg Oral TID  . hydrALAZINE  50 mg Oral 4 times per day  . insulin aspart  0-9 Units Subcutaneous 6 times per day  . isosorbide mononitrate  30 mg Oral Daily  . levothyroxine  175 mcg Oral QAC breakfast  . losartan  100 mg Oral Daily  . metoprolol succinate  50 mg Oral Daily  . pantoprazole (PROTONIX) IV  40 mg Intravenous Q12H  . sertraline  100 mg Oral Daily   Continuous Infusions:  PRN Meds:.acetaminophen,  acetaminophen, HYDROmorphone (DILAUDID) injection, LORazepam, ondansetron (ZOFRAN) IV  Assessment/Plan:  Principal Problem:   Gastric outlet obstruction Active Problems:   Essential hypertension, benign   GERD (gastroesophageal reflux disease)   Dyslipidemia   DM (diabetes mellitus) type II uncontrolled, periph vascular disorder   Diabetic neuropathy   Hypothyroidism   Depression   Anemia of chronic disease   CKD (chronic kidney disease) stage 3, GFR 30-59 ml/min   Gastric bezoar   80 year old with bezoar, post op hypertension, transient chest pain, elevated troponin/demand ischemia.   1) Demand ischemia - during her recent episode of hypertension, troponin isolated mildly elevated 0.34 with subsequent normal troponin. Supply demand mismatch. Not ACS, no MI. Continue with current treatment. Home meds which include beta blocker, ARB, Imdur, ASA. Will restart atorvastatin 10mg  as she was on at home.   2) Hypertension - now controlled on home meds. Her recent episode of difficult to control hypertension was in the setting of held home meds. Much improved on current regiment.   3) Chest pain - transient, when I saw her yesterday, no CP. ?GI related transient CP in the setting of recent procedures. ECG reassuring. Troponin minimally elevated due to demand ischemia, stress on myocardium with underlying illness. No MI/ ACS.   Will sign  off. Please call if any questions.   Adryanna Friedt, [place] 01/21/2014, 9:05 AM

## 2014-01-22 LAB — BASIC METABOLIC PANEL
Anion gap: 10 (ref 5–15)
BUN: 10 mg/dL (ref 6–23)
CO2: 21 mEq/L (ref 19–32)
Calcium: 8.9 mg/dL (ref 8.4–10.5)
Chloride: 100 mEq/L (ref 96–112)
Creatinine, Ser: 1.14 mg/dL — ABNORMAL HIGH (ref 0.50–1.10)
GFR calc Af Amer: 48 mL/min — ABNORMAL LOW (ref >90)
GFR calc non Af Amer: 42 mL/min — ABNORMAL LOW (ref >90)
Glucose, Bld: 100 mg/dL — ABNORMAL HIGH (ref 70–99)
Potassium: 3.8 mEq/L (ref 3.7–5.3)
Sodium: 131 mEq/L — ABNORMAL LOW (ref 137–147)

## 2014-01-22 LAB — CBC
HCT: 26.7 % — ABNORMAL LOW (ref 36.0–46.0)
Hemoglobin: 9.2 g/dL — ABNORMAL LOW (ref 12.0–15.0)
MCH: 30.6 pg (ref 26.0–34.0)
MCHC: 34.5 g/dL (ref 30.0–36.0)
MCV: 88.7 fL (ref 78.0–100.0)
Platelets: 206 10*3/uL (ref 150–400)
RBC: 3.01 MIL/uL — ABNORMAL LOW (ref 3.87–5.11)
RDW: 16.1 % — ABNORMAL HIGH (ref 11.5–15.5)
WBC: 9.9 10*3/uL (ref 4.0–10.5)

## 2014-01-22 LAB — TROPONIN I
Troponin I: 0.33 ng/mL (ref <0.30)
Troponin I: <0.3 ng/mL (ref <0.30)
Troponin I: <0.3 ng/mL (ref <0.30)

## 2014-01-22 LAB — GLUCOSE, CAPILLARY
Glucose-Capillary: 119 mg/dL — ABNORMAL HIGH (ref 70–99)
Glucose-Capillary: 89 mg/dL (ref 70–99)
Glucose-Capillary: 95 mg/dL (ref 70–99)
Glucose-Capillary: 117 mg/dL — ABNORMAL HIGH (ref 70–99)

## 2014-01-22 MED ORDER — NITROFURANTOIN MONOHYD MACRO 100 MG PO CAPS: 100.0000 mg | ORAL_CAPSULE | Freq: Two times a day (BID) | ORAL | Status: DC

## 2014-01-22 MED ORDER — CETYLPYRIDINIUM CHLORIDE 0.05 % MT LIQD
7.0000 mL | Freq: Two times a day (BID) | OROMUCOSAL | Status: DC
Start: 2014-01-22 — End: 2014-03-07

## 2014-01-22 MED ORDER — INSULIN ASPART 100 UNIT/ML ~~LOC~~ SOLN: 0.0000 [IU] | SUBCUTANEOUS | Status: DC

## 2014-01-22 MED ORDER — DSS 100 MG PO CAPS: 100.0000 mg | ORAL_CAPSULE | Freq: Two times a day (BID) | ORAL | Status: DC | PRN

## 2014-01-22 MED ORDER — ENOXAPARIN SODIUM 80 MG/0.8ML ~~LOC~~ SOLN
1.0000 mg/kg | Freq: Two times a day (BID) | SUBCUTANEOUS | Status: DC
  Administered 2014-01-22: 70 mg via SUBCUTANEOUS
  Filled 2014-01-22 (×2): qty 0.8

## 2014-01-22 MED ORDER — ISOSORBIDE MONONITRATE ER 60 MG PO TB24
60.0000 mg | ORAL_TABLET | Freq: Every day | ORAL | Status: DC
  Administered 2014-01-22: 60 mg via ORAL
  Filled 2014-01-22: qty 1

## 2014-01-22 MED ORDER — NITROGLYCERIN 0.4 MG SL SUBL
0.4000 mg | SUBLINGUAL_TABLET | Freq: Once | SUBLINGUAL | Status: AC
  Administered 2014-01-22: 0.4 mg via SUBLINGUAL
  Filled 2014-01-22: qty 1

## 2014-01-22 MED ORDER — TRAMADOL HCL 50 MG PO TABS: 50.0000 mg | ORAL_TABLET | Freq: Two times a day (BID) | ORAL | Status: DC | PRN

## 2014-01-22 MED ORDER — CHLORHEXIDINE GLUCONATE 0.12 % MT SOLN: 15.0000 mL | Freq: Two times a day (BID) | OROMUCOSAL | Status: DC

## 2014-01-22 MED ORDER — NITROGLYCERIN 0.2 MG/HR TD PT24
0.2000 mg | MEDICATED_PATCH | Freq: Every day | TRANSDERMAL | Status: DC
  Filled 2014-01-22: qty 1

## 2014-01-22 MED ORDER — DOCUSATE SODIUM 100 MG PO CAPS
100.0000 mg | ORAL_CAPSULE | Freq: Two times a day (BID) | ORAL | Status: DC
  Administered 2014-01-22: 100 mg via ORAL
  Filled 2014-01-22: qty 1

## 2014-01-22 NOTE — Progress Notes (Signed)
CRITICAL VALUE ALERT  Critical value received:  Troponin I   Date of notification:  01/22/2014   Time of notification:  0010  Critical value read back:Yes.    Nurse who received alert:  Chloe,RN  MD notified (1st page):  Reece Levy  Time of first page:  0012   MD notified (2nd page):  Time of second page:  Responding MD:  Reece Levy  Time MD responded:  [phone]

## 2014-01-22 NOTE — Progress Notes (Addendum)
Patient complained of mild left sided chest pain. Troponin became low positive again. Discussed with on call cardiology Dr. Clayborne Artist. Two EKGs done and reviewed, no changes or finds concerning for ischemia. Of note at 9 PM patient's BP was 198/69 which may have triggered her current episode. Patient already on aspirin. Started the patient on therapeutic lovenox for now. After discussion with cardiology apply nitro patch to help with chest pain and blood pressure. Cardiology will see the patient in the morning. Continue cycling troponin. Patient seen and examined. Chart and vitals reviewed.  Tekisha Darcey A, MD 01/22/2014, 12:44 AM  Addendum: Patient already on imdur. Nitro patch discontinued. Patient to be given one subligual nitro. Consider increasing imdur dose in the morning. Ibrahem Volkman A. MD 01/22/2014, 12:50 AM

## 2014-01-22 NOTE — Progress Notes (Signed)
Subjective:  No CP currently. Felt "gassy" discomfort through chest.   No SOB   Objective:  Vital Signs in the last 24 hours: Temp:  [98 F (36.7 C)-98.6 F (37 C)] 98.1 F (36.7 C) (10/22 0453) Pulse Rate:  [65-86] 67 (10/22 0453) Resp:  [16-67] 67 (10/22 0453) BP: (133-198)/(47-89) 133/60 mmHg (10/22 0453) SpO2:  [91 %-100 %] 100 % (10/22 0453) Weight:  [159 lb 9.8 oz (72.4 kg)] 159 lb 9.8 oz (72.4 kg) (10/21 1013)  Intake/Output from previous day: 10/21 0701 - 10/22 0700 In: 63 [P.O.:460] Out: -    Physical Exam: General: Elderly, in no acute distress. Head:  Normocephalic and atraumatic. Lungs: Clear to auscultation and percussion. Heart: Normal S1 and S2.  Soft systolic/diastolic RUSB murmur, no rubs or gallops.  Abdomen: soft, non-tender, positive bowel sounds. Extremities: No clubbing or cyanosis. No edema. Neurologic: Alert.    Lab Results:  Recent Labs  01/20/14 0510 01/22/14 0520  WBC 7.6 9.9  HGB 10.6* 9.2*  PLT 235 206    Recent Labs  01/20/14 0510 01/22/14 0520  NA 138 131*  K 4.0 3.8  CL 105 100  CO2 19 21  GLUCOSE 121* 100*  BUN 15 10  CREATININE 1.13* 1.14*    Recent Labs  01/21/14 2325 01/22/14 0520  TROPONINI 0.33* <0.30    Telemetry: NSR Personally viewed.   EKG:  No ST changes  Cardiac Studies:  EF 55%  Scheduled Meds: . antiseptic oral rinse  7 mL Mouth Rinse q12n4p  . aspirin EC  81 mg Oral Daily  . atorvastatin  10 mg Oral q1800  . chlorhexidine  15 mL Mouth Rinse BID  . cycloSPORINE  1 drop Both Eyes BID  . enoxaparin (LOVENOX) injection  1 mg/kg Subcutaneous Q12H  . fluticasone  2 spray Each Nare Daily  . gabapentin  300 mg Oral TID  . hydrALAZINE  50 mg Oral 4 times per day  . insulin aspart  0-9 Units Subcutaneous 6 times per day  . isosorbide mononitrate  30 mg Oral Daily  . levothyroxine  175 mcg Oral QAC breakfast  . losartan  100 mg Oral Daily  . metoprolol succinate  50 mg Oral Daily  .  pantoprazole (PROTONIX) IV  40 mg Intravenous Q12H  . sertraline  100 mg Oral Daily   Continuous Infusions:  PRN Meds:.acetaminophen, acetaminophen, HYDROmorphone (DILAUDID) injection, LORazepam, ondansetron (ZOFRAN) IV  Assessment/Plan:  Principal Problem:   Gastric outlet obstruction Active Problems:   Essential hypertension, benign   GERD (gastroesophageal reflux disease)   Dyslipidemia   DM (diabetes mellitus) type II uncontrolled, periph vascular disorder   Diabetic neuropathy   Hypothyroidism   Depression   Anemia of chronic disease   CKD (chronic kidney disease) stage 3, GFR 30-59 ml/min   Gastric bezoar   Demand ischemia   80 year old with bezoar, post op hypertension, transient chest pain, minimally elevated troponin/demand ischemia.   1) Demand ischemia - during her recent episode of hypertension, troponin isolated mildly elevated 0.34 with subsequent normal troponin. Supply demand mismatch. Not ACS, no MI. Continue with current treatment. Home meds which include beta blocker, ARB, Imdur, ASA. Will restart atorvastatin 10mg  as she was on at home.   2) Hypertension - now controlled on home meds. Her recent episode of difficult to control hypertension was in the setting of held home meds. Much improved on current regiment.   3) Chest pain - transient, when I saw her yesterday,  no CP. ?GI related transient CP in the setting of recent procedures. Could be cardiac but ECG reassuring. Troponin minimally elevated due to demand ischemia, stress on myocardium with underlying illness. No MI/ ACS. As was suggested by Dr. Reece Levy, I will increase Imdur to 60mg  to see if this helps. Medical mgt. No further cardiac testing. Certainly with her recent GI illness, she has reason for discomfort.   Lio Wehrly, [place] 01/22/2014, 7:30 AM

## 2014-01-22 NOTE — Discharge Summary (Signed)
Physician Discharge Summary  Shannon [name] NWG:[id number] DOB: 07/10/1923 DOA: 01/17/2014  PCP: Oneal Grout, MD  Admit date: 01/17/2014 Discharge date: 01/22/2014  Recommendations for Outpatient Follow-up:  1. Macrobid 100 mg PO BID for 3 days for possible UTI.  Discharge Diagnoses:  Principal Problem:   Gastric outlet obstruction Active Problems:   Essential hypertension, benign   GERD (gastroesophageal reflux disease)   Dyslipidemia   DM (diabetes mellitus) type II uncontrolled, periph vascular disorder   Diabetic neuropathy   Hypothyroidism   Depression   Anemia of chronic disease   CKD (chronic kidney disease) stage 3, GFR 30-59 ml/min   Gastric bezoar   Demand ischemia    Discharge Condition: stable   Diet recommendation: as tolerated   History of present illness:  80 year old female with past medical history of hypertension, dyslipidemia, diabetes, CKD stage 3, depression, hypothyroidism, history underwent exploratory laparotomy with EGD and resection of a gastrojejunal anastomosis with primary jejunojejunal anastomosis, repair of recurrent incisional hernia in 09/2013, atrial fibrillation, resolved. Patient presented to [facility] ED with complaints of sharp diffuse abdominal pain for past few days prior to this admission associated with nausea and vomiting. No reports of fevers or chills. No reports of blood in stool. On admission, patient was found to have gastric outlet obstruction as evidenced by CT abdomen. Pt underwent EGD 01/19/2014 with findings of erosive esophagitis and a large gastric bezoar replacing the proximal third of the stomach but no endoscopic evidence for surgical altered anatomy, stomach ulcer, or high-grade stenosis. EGD repeated and phytobezoar polarized. Should pass spontaneously with time.   Assessment/Plan:   Principal Problem:  Gastric outlet obstruction  Patient required NG tube on admission She underwent EGD 01/19/2014 with findings of erosive  esophagitis and a large gastric bezoar replacing the proximal third of the stomach but no endoscopic evidence for surgical altered anatomy, stomach ulcer, or high-grade stenosis.  EGD 10/20 --> Esophagitis with esophageal stricture, gastric phytobezoar status post pulverization  Avoid fibrous vegetable matter, continue daily PPI indefinitely  No signs of active bleeding. Active Problems:  Essential hypertension, benign  BP improved with restarting home medical regimen: Imdur, Hydralazine, Losartan, Metoprolol  Continue same regimen GERD (gastroesophageal reflux disease)  Continue PPI Dyslipidemia  Resume statin upon discharge  DM (diabetes mellitus) type II uncontrolled, peripheral vascular disorder  Home meds can be resumed on discharge.  Diabetic neuropathy  Continue neurontin as per home medical regimen  Hypothyroidism  Continue synthroid, change to PO  Chest pain  Has been seen by cardio, less likely ACS and most likely due to GI symptoms. Cleared for discharge from cardio standpoint.  Depression  Ativan PRN. Resume zoloft  Possible UTI  Family requested prophylactic abx for possible UTI. Pt had some burning sensation on urination on discharge. UA on admission, showed no evidence of infection. Gave Macrobid for 3 days on discharge.  Anemia of chronic disease  Secondary to history of CKD.  No current indications for transfusion.  CKD (chronic kidney disease) stage 3, GFR 30-59 ml/min  Baseline creatinine 1.5 and on this admission 1.37.  DVT Prophylaxis  Lovenox SQ  Code Status: Full.  Family Communication: plan of care discussed with the patient and her daughter. Agree with discharge plan.  IV Access:   Peripheral IV Procedures and diagnostic studies:   Ct Abdomen Pelvis W Contrast 01/17/2014 1. High-grade gastric outlet obstruction with pylorus appearance favoring peptic ulcer disease over mass. Follow-up endoscopy or upper GI suggested. 2. Numerous non acute findings are  noted above.  EGD10/19/2015 1. Erosive esophagitis 2. 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 No endoscopic evidence for surgical altered anatomy, stomach ulcer, or high-grade stenosis 4. Question motility disorder. Question downstream process. Question what type of surgical procedure recently done and why Medical Consultants:   Surgery  Gastroenterology  Cardiology  Other Consultants:   None  Anti-Infectives:   None    Signed:  Manson Passey, MD  [facility] 01/22/2014, 10:25 AM  Pager #: [phone]   Discharge Exam: Filed Vitals:   01/22/14 0453  BP: 133/60  Pulse: 67  Temp: 98.1 F (36.7 C)  Resp: 67   Filed Vitals:   01/21/14 2308 01/22/14 0020 01/22/14 0105 01/22/14 0453  BP: 168/78 152/49 134/60 133/60  Pulse: 86 69  67  Temp: 98.1 F (36.7 C) 98 F (36.7 C)  98.1 F (36.7 C)  TempSrc: Oral Oral  Oral  Resp: 18 18  67  Height:      Weight:      SpO2: 92% 100%  100%    General: Pt is alert, follows commands appropriately, not in acute distress Cardiovascular: Regular rate and rhythm, S1/S2 +, no murmurs Respiratory: Clear to auscultation bilaterally, no wheezing Abdominal: Soft, non tender, non distended, bowel sounds +, no guarding Extremities: no edema, no cyanosis, pulses palpable bilaterally DP and PT Neuro: Grossly nonfocal  Discharge Instructions  Discharge Instructions   Call MD for:  difficulty breathing, headache or visual disturbances    Complete by:  As directed      Call MD for:  persistant dizziness or light-headedness    Complete by:  As directed      Call MD for:  persistant nausea and vomiting    Complete by:  As directed      Call MD for:  severe uncontrolled pain    Complete by:  As directed      Diet - low sodium heart healthy    Complete by:  As directed      Increase activity slowly    Complete by:  As directed             Medication List         antiseptic oral rinse 0.05 % Liqd  solution  Commonly known as:  CPC / CETYLPYRIDINIUM CHLORIDE 0.05%  7 mLs by Mouth Rinse route 2 times daily at 12 noon and 4 pm.     aspirin EC 81 MG tablet  Take 1 tablet (81 mg total) by mouth daily.     atorvastatin 10 MG tablet  Commonly known as:  LIPITOR  Take 10 mg by mouth daily at 6 PM.     CALTRATE 600 PO  Take 2 capsules by mouth 2 (two) times daily.     chlorhexidine 0.12 % solution  Commonly known as:  PERIDEX  15 mLs by Mouth Rinse route 2 (two) times daily.     cycloSPORINE 0.05 % ophthalmic emulsion  Commonly known as:  RESTASIS  Place 1 drop into both eyes 2 (two) times daily.     DSS 100 MG Caps  Take 100 mg by mouth 2 (two) times daily as needed for mild constipation.     fluticasone 50 MCG/ACT nasal spray  Commonly known as:  FLONASE  Place 2 sprays into both nostrils daily.     gabapentin 300 MG capsule  Commonly known as:  NEURONTIN  Take 300 mg by mouth 3 (three) times daily.  hydrALAZINE 50 MG tablet  Commonly known as:  APRESOLINE  Take one tablet by mouth four times daily to control blood pressure        isosorbide mononitrate 30 MG 24 hr tablet  Commonly known as:  IMDUR  Take 30 mg by mouth daily.     l-methylfolate-B6-B12 3-35-2 MG Tabs  Commonly known as:  METANX  Take one tablet by mouth once daily     levothyroxine 175 MCG tablet  Commonly known as:  SYNTHROID, LEVOTHROID  Take 175 mcg by mouth daily before breakfast.     losartan 100 MG tablet  Commonly known as:  COZAAR  Take 100 mg by mouth daily.     magnesium oxide 400 MG tablet  Commonly known as:  MAG-OX  Take one tablet by mouth twice daily     metoprolol succinate 50 MG 24 hr tablet  Commonly known as:  TOPROL-XL  Take 50 mg by mouth daily. Take with or immediately following a meal.     Macrobid 100 mg PO bid  multivitamin with minerals tablet  Take 1 tablet by mouth daily.     MULTIVITAMIN & MINERAL PO  Take 1 tablet by mouth daily.     pantoprazole 40  MG tablet  Commonly known as:  PROTONIX  Take 40 mg by mouth daily.     sennosides-docusate sodium 8.6-50 MG tablet  Commonly known as:  SENOKOT-S  Take 1 tablet by mouth 2 (two) times daily.     sertraline 100 MG tablet  Commonly known as:  ZOLOFT  Take 100 mg by mouth daily.     sucralfate 1 G tablet  Commonly known as:  CARAFATE  Take 1 g by mouth 3 (three) times daily with meals.     traMADol 50 MG tablet  Commonly known as:  ULTRAM  Take 1 tablet (50 mg total) by mouth every 12 (twelve) hours as needed for severe pain (pain).     TYLENOL EX ST ARTHRITIS PAIN 500 MG tablet  Generic drug:  acetaminophen  Take 500 mg by mouth every 6 (six) hours as needed (for arthritis pain).     VASCULERA Tabs  Take 1 tablet by mouth daily.            Follow-up Information   Follow up with Oneal Grout, MD. Schedule an appointment as soon as possible for a visit in 1 week. (Follow up appt after recent hospitalization)    Specialty:  Internal Medicine   Contact information:   [address] [place] [address] [phone]        The results of significant diagnostics from this hospitalization (including imaging, microbiology, ancillary and laboratory) are listed below for reference.    Significant Diagnostic Studies: Ct Head Wo Contrast  12/23/2013   CLINICAL DATA:  Slurred speech  EXAM: CT HEAD WITHOUT CONTRAST  TECHNIQUE: Contiguous axial images were obtained from the base of the skull through the vertex without intravenous contrast.  COMPARISON:  None.  FINDINGS: There is no evidence of mass effect, midline shift, or extra-axial fluid collections. There is no evidence of a space-occupying lesion or intracranial hemorrhage. There is no evidence of a cortical-based area of acute infarction. There is generalized cerebral atrophy. There is periventricular white matter low attenuation likely secondary to microangiopathy.  The ventricles and sulci are appropriate for the  patient's age. The basal cisterns are patent.  Visualized portions of the orbits are unremarkable. The visualized portions of the paranasal sinuses and  mastoid air cells are unremarkable. Cerebrovascular atherosclerotic calcifications are noted.  The osseous structures are unremarkable.  IMPRESSION: No acute intracranial pathology.  These results were called by telephone at the time of interpretation on 12/23/2013 at 8:50 pm to Dr. Devoria Albe , who verbally acknowledged these results.   Electronically Signed   By: Elige Ko   On: 12/23/2013 20:50   [name] Head Wo Contrast  12/24/2013   CLINICAL DATA:  80 year old female with episode of slurred speech, weakness and diaphoresis. Code stroke. Query seizure. Initial encounter.  EXAM: MRI HEAD WITHOUT CONTRAST  MRA HEAD WITHOUT CONTRAST  MRA NECK WITHOUT CONTRAST  TECHNIQUE: Multiplanar, multiecho pulse sequences of the brain and surrounding structures were obtained without intravenous contrast. Angiographic images of the Circle of Willis were obtained using MRA technique without intravenous contrast. Angiographic images of the neck were obtained using MRA technique without intravenous contrast. Carotid stenosis measurements (when applicable) are obtained utilizing NASCET criteria, using the distal internal carotid diameter as the denominator.  COMPARISON:  Head CT without contrast 12/23/2013 at 2032 hrs.  FINDINGS: MRI HEAD FINDINGS  Study is intermittently degraded by motion artifact despite repeated imaging attempts.  No restricted diffusion to suggest acute infarction. No midline shift, mass effect, evidence of mass lesion, ventriculomegaly, extra-axial collection or acute intracranial hemorrhage. Cervicomedullary junction and pituitary are within normal limits. Major intracranial vascular flow voids are preserved.  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 Patchy cerebral white matter T2 and FLAIR hyperintensity, with some  white matter signal changes most resembling chronic lacunar infarcts. Occasional chronic micro hemorrhages in the brain.  Visible internal auditory structures appear normal. Visualized paranasal sinuses and mastoids are clear. Postoperative changes to the globes. Visualized scalp soft tissues are within normal limits. Normal bone marrow signal.  Heterogeneity of the C3-C4 endplates in the visible upper cervical spine is felt to be degenerative in nature.  MRA HEAD FINDINGS  Antegrade flow in the posterior circulation. Codominant distal vertebral arteries. Normal PICA and AICA origins. Patent vertebrobasilar junction. No basilar stenosis. SCA and PCA origins are within normal limits. Tortuous right posterior communicating artery, the left is diminutive or absent. Bilateral PCA branches are within normal limits.  Antegrade flow in both ICA siphons. Tortuous distal cervical right ICA. Siphon irregularity in keeping with atherosclerosis, but no siphon stenosis. Ophthalmic and right posterior communicating artery origins are within normal limits. Patent carotid termini. Normal MCA and ACA origins. Anterior communicating artery within normal limits.  High-grade tandem left ACA A2 segment and pericholecystic all region stenosis, with attenuated left ACA flow. See series 8, image 46. Visualized right ACA branches are within normal limits.  Bilateral MCA M1 segments are within normal limits. Both MCA bifurcations are patent. Visualized bilateral MCA branches are within normal limits.  MRA NECK FINDINGS  Time-of-flight imaging demonstrates antegrade flow in both carotid and vertebral arteries in the neck. There is irregularity and loss of flow signal at both carotid bifurcations, greater on the right. The appearance of the right ICA origin is suspicious for hemodynamically significant stenosis (series 16, image 35). Tortuous distal cervical right ICA.  IMPRESSION: 1. No acute intracranial abnormality. Moderately advanced  chronic small vessel ischemia. 2. Carotid atherosclerosis and tortuosity in the neck, greater on the right. The appearance is suspicious for right ICA origin hemodynamically significant stenosis. Carotid doppler may be the simplest way to confirm. 3. 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 4.  Otherwise negative for age intracranial MRA.   Electronically Signed   By: Augusto Gamble M.D.   On: 12/24/2013 11:50   Mr Angiogram Neck Wo Contrast  12/24/2013   CLINICAL DATA:  80 year old female with episode of slurred speech, weakness and diaphoresis. Code stroke. Query seizure. Initial encounter.  EXAM: MRI HEAD WITHOUT CONTRAST  MRA HEAD WITHOUT CONTRAST  MRA NECK WITHOUT CONTRAST  TECHNIQUE: Multiplanar, multiecho pulse sequences of the brain and surrounding structures were obtained without intravenous contrast. Angiographic images of the Circle of Willis were obtained using MRA technique without intravenous contrast. Angiographic images of the neck were obtained using MRA technique without intravenous contrast. Carotid stenosis measurements (when applicable) are obtained utilizing NASCET criteria, using the distal internal carotid diameter as the denominator.  COMPARISON:  Head CT without contrast 12/23/2013 at 2032 hrs.  FINDINGS: MRI HEAD FINDINGS  Study is intermittently degraded by motion artifact despite repeated imaging attempts.  No restricted diffusion to suggest acute infarction. No midline shift, mass effect, evidence of mass lesion, ventriculomegaly, extra-axial collection or acute intracranial hemorrhage. Cervicomedullary junction and pituitary are within normal limits. Major intracranial vascular flow voids are preserved.  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 Patchy cerebral white matter T2 and FLAIR hyperintensity, with some white matter signal changes most resembling chronic lacunar infarcts. Occasional chronic micro  hemorrhages in the brain.  Visible internal auditory structures appear normal. Visualized paranasal sinuses and mastoids are clear. Postoperative changes to the globes. Visualized scalp soft tissues are within normal limits. Normal bone marrow signal.  Heterogeneity of the C3-C4 endplates in the visible upper cervical spine is felt to be degenerative in nature.  MRA HEAD FINDINGS  Antegrade flow in the posterior circulation. Codominant distal vertebral arteries. Normal PICA and AICA origins. Patent vertebrobasilar junction. No basilar stenosis. SCA and PCA origins are within normal limits. Tortuous right posterior communicating artery, the left is diminutive or absent. Bilateral PCA branches are within normal limits.  Antegrade flow in both ICA siphons. Tortuous distal cervical right ICA. Siphon irregularity in keeping with atherosclerosis, but no siphon stenosis. Ophthalmic and right posterior communicating artery origins are within normal limits. Patent carotid termini. Normal MCA and ACA origins. Anterior communicating artery within normal limits.  High-grade tandem left ACA A2 segment and pericholecystic all region stenosis, with attenuated left ACA flow. See series 8, image 46. Visualized right ACA branches are within normal limits.  Bilateral MCA M1 segments are within normal limits. Both MCA bifurcations are patent. Visualized bilateral MCA branches are within normal limits.  MRA NECK FINDINGS  Time-of-flight imaging demonstrates antegrade flow in both carotid and vertebral arteries in the neck. There is irregularity and loss of flow signal at both carotid bifurcations, greater on the right. The appearance of the right ICA origin is suspicious for hemodynamically significant stenosis (series 16, image 35). Tortuous distal cervical right ICA.  IMPRESSION: 1. No acute intracranial abnormality. Moderately advanced chronic small vessel ischemia. 2. Carotid atherosclerosis and tortuosity in the neck, greater on  the right. The appearance is suspicious for right ICA origin hemodynamically significant stenosis. Carotid doppler may be the simplest way to confirm. 3. 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 4. Otherwise negative for age intracranial MRA.   Electronically Signed   By: Augusto Gamble M.D.   On: 12/24/2013 11:50   Mr Brain Wo Contrast  12/24/2013   CLINICAL DATA:  80 year old female with episode of slurred speech,  weakness and diaphoresis. Code stroke. Query seizure. Initial encounter.  EXAM: MRI HEAD WITHOUT CONTRAST  MRA HEAD WITHOUT CONTRAST  MRA NECK WITHOUT CONTRAST  TECHNIQUE: Multiplanar, multiecho pulse sequences of the brain and surrounding structures were obtained without intravenous contrast. Angiographic images of the Circle of Willis were obtained using MRA technique without intravenous contrast. Angiographic images of the neck were obtained using MRA technique without intravenous contrast. Carotid stenosis measurements (when applicable) are obtained utilizing NASCET criteria, using the distal internal carotid diameter as the denominator.  COMPARISON:  Head CT without contrast 12/23/2013 at 2032 hrs.  FINDINGS: MRI HEAD FINDINGS  Study is intermittently degraded by motion artifact despite repeated imaging attempts.  No restricted diffusion to suggest acute infarction. No midline shift, mass effect, evidence of mass lesion, ventriculomegaly, extra-axial collection or acute intracranial hemorrhage. Cervicomedullary junction and pituitary are within normal limits. Major intracranial vascular flow voids are preserved.  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 Patchy cerebral white matter T2 and FLAIR hyperintensity, with some white matter signal changes most resembling chronic lacunar infarcts. Occasional chronic micro hemorrhages in the brain.  Visible internal auditory structures appear normal. Visualized paranasal  sinuses and mastoids are clear. Postoperative changes to the globes. Visualized scalp soft tissues are within normal limits. Normal bone marrow signal.  Heterogeneity of the C3-C4 endplates in the visible upper cervical spine is felt to be degenerative in nature.  MRA HEAD FINDINGS  Antegrade flow in the posterior circulation. Codominant distal vertebral arteries. Normal PICA and AICA origins. Patent vertebrobasilar junction. No basilar stenosis. SCA and PCA origins are within normal limits. Tortuous right posterior communicating artery, the left is diminutive or absent. Bilateral PCA branches are within normal limits.  Antegrade flow in both ICA siphons. Tortuous distal cervical right ICA. Siphon irregularity in keeping with atherosclerosis, but no siphon stenosis. Ophthalmic and right posterior communicating artery origins are within normal limits. Patent carotid termini. Normal MCA and ACA origins. Anterior communicating artery within normal limits.  High-grade tandem left ACA A2 segment and pericholecystic all region stenosis, with attenuated left ACA flow. See series 8, image 46. Visualized right ACA branches are within normal limits.  Bilateral MCA M1 segments are within normal limits. Both MCA bifurcations are patent. Visualized bilateral MCA branches are within normal limits.  MRA NECK FINDINGS  Time-of-flight imaging demonstrates antegrade flow in both carotid and vertebral arteries in the neck. There is irregularity and loss of flow signal at both carotid bifurcations, greater on the right. The appearance of the right ICA origin is suspicious for hemodynamically significant stenosis (series 16, image 35). Tortuous distal cervical right ICA.  IMPRESSION: 1. No acute intracranial abnormality. Moderately advanced chronic small vessel ischemia. 2. Carotid atherosclerosis and tortuosity in the neck, greater on the right. The appearance is suspicious for right ICA origin hemodynamically significant stenosis.  Carotid doppler may be the simplest way to confirm. 3. 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 4. Otherwise negative for age intracranial MRA.   Electronically Signed   By: Augusto Gamble M.D.   On: 12/24/2013 11:50   Ct Abdomen Pelvis W Contrast  01/17/2014   CLINICAL DATA:  Vomiting. Abdominal pain. History of small bowel obstruction.  EXAM: CT ABDOMEN AND PELVIS WITH CONTRAST  TECHNIQUE: Multidetector CT imaging of the abdomen and pelvis was performed using the standard protocol following bolus administration of intravenous contrast.  CONTRAST:  50mL OMNIPAQUE IOHEXOL 300 MG/ML SOLN, 80mL OMNIPAQUE  IOHEXOL 300 MG/ML SOLN  COMPARISON:  None.  FINDINGS: BODY WALL: Unremarkable.  LOWER CHEST: Cardiomegaly. Gastroesophageal reflux in the setting of gastric outlet obstruction.  ABDOMEN/PELVIS:  Liver: Numerous well circumscribed low-density lesions most consistent with cysts. No indication of malignancy history in the electronic medical record.  Biliary: Cholecystectomy.  Pancreas: Atrophy without acute finding.  Spleen: Incidental sub cm cyst or other lesion in the anterior subcapsular spleen.  Adrenals: Unremarkable.  Kidneys and ureters: Lobulation of both renal cortices consistent with scarring. Bilateral renal atrophy. No hydronephrosis.  Bladder: Unremarkable.  Reproductive: Unremarkable for age.  The pelvic floor appears low.  Bowel: The stomach is massively dilated to the level of the pylorus which is circumferentially thickened (single wall thickness of 15 mm). The mucosa is distinct at the level of the pylorus, with submucosal low-density favoring inflammatory edema. No evidence for perforation or pneumatosis. There is been in enteroenterostomy which is patent. This may be related to electronic medical record history of small bowel obstruction. Sutures noted along the anterior gastric body, likely related to patient's history perforated gastric ulcer. Fluid levels  present in the distal colon consistent with diarrhea. Colonic diverticulosis.  Retroperitoneum: No mass or adenopathy.  Peritoneum: No ascites or pneumoperitoneum.  Vascular: No acute abnormality.  OSSEOUS: No acute abnormalities. Remote appearing T11 compression fracture with approximate 50% height loss.  IMPRESSION: 1. High-grade gastric outlet obstruction with pylorus appearance favoring peptic ulcer disease over mass. Follow-up endoscopy or upper GI suggested. 2. Numerous non acute findings are noted above.   Electronically Signed   By: Tiburcio Pea M.D.   On: 01/17/2014 22:54    Microbiology: MRSA PCR SCREENING     Status: None   Collection Time    01/20/14  5:28 PM      Result Value Ref Range Status   MRSA by PCR NEGATIVE  NEGATIVE Final     Labs: Basic Metabolic Panel:  Recent Labs Lab 01/17/14 2120 01/20/14 0510 01/22/14 0520  NA 134* 138 131*  K 4.7 4.0 3.8  CL 96 105 100  CO2 26 19 21   GLUCOSE 110* 121* 100*  BUN 26* 15 10  CREATININE 1.37* 1.13* 1.14*  CALCIUM 10.4 9.0 8.9   Liver Function Tests:  Recent Labs Lab 01/17/14 2120  AST 33  ALT 16  ALKPHOS 73  BILITOT 0.4  PROT 8.0  ALBUMIN 3.9    Recent Labs Lab 01/17/14 2120  LIPASE 91*   No results found for this basename: AMMONIA,  in the last 168 hours CBC:  Recent Labs Lab 01/17/14 2120 01/20/14 0510 01/22/14 0520  WBC 6.9 7.6 9.9  NEUTROABS 3.8  --   --   HGB 11.1* 10.6* 9.2*  HCT 32.9* 31.1* 26.7*  MCV 90.1 90.1 88.7  PLT 227 235 206   Cardiac Enzymes:  Recent Labs Lab 01/20/14 1529 01/20/14 2111 01/21/14 0311 01/21/14 2325 01/22/14 0520  TROPONINI <0.30 0.34* <0.30 0.33* <0.30   BNP: BNP (last 3 results)  Recent Labs  01/17/14 2116  PROBNP 961.6*   CBG:  Recent Labs Lab 01/21/14 1650 01/21/14 2015 01/21/14 2348 01/22/14 0451 01/22/14 0741  GLUCAP 108* 119* 92 89 95    Time coordinating discharge: Over 30 minutes

## 2014-01-22 NOTE — Evaluation (Signed)
Physical Therapy Evaluation-1x eval Patient Details Name: Shannon [name] MRN: [id number] DOB: [birth year] Today's Date: 01/22/2014   History of Present Illness  80 yo female admitted wtih gastric outlet obstruction, chest pain, HTN. Hx of sz, cva, dm, ventral hernia, htn, exp lap 09/2013  Clinical Impression  On eval, pt required Min assist to ambulate without device, Min guard with RW ~50 feet x 2. Demonstrates general weakness, decreased activity tolerance and impaired gait and balance. Recommend HHPT and RW use for safety. Pt plans to d/c later today. Made RN aware of recommendations. 1x eval.     Follow Up Recommendations Home health PT;Supervision/Assistance - 24 hour    Equipment Recommendations  None recommended by PT    Recommendations for Other Services       Precautions / Restrictions Precautions Precautions: Fall Restrictions Weight Bearing Restrictions: No      Mobility  Bed Mobility   Bed Mobility: Supine to Sit     Supine to sit: Min guard        Transfers Overall transfer level: Needs assistance Equipment used: Rolling walker (2 wheeled) Transfers: Sit to/from Stand Sit to Stand: Min assist         General transfer comment: assist to rise, stabilize, control descent. vcs safety, technique, hand placement  Ambulation/Gait Ambulation/Gait assistance: Min assist Ambulation Distance (Feet): 50 Feet (x2) Assistive device: Rolling walker (2 wheeled) Gait Pattern/deviations: Step-through pattern;Decreased stride length     General Gait Details: min assist for ambulation without assistive device. min guard assist with walker. Seated rest break needed. dyspnea 3/4 with ambulation. O2 sats 93% on RA.   Stairs            Wheelchair Mobility    Modified Rankin (Stroke Patients Only)       Balance                                             Pertinent Vitals/Pain Pain Assessment: No/denies pain    Home Living  Family/patient expects to be discharged to:: Private residence Living Arrangements: Children Available Help at Discharge: Family Type of Home: House Home Access: Stairs to enter Entrance Stairs-Rails: None Entrance Stairs-Number of Steps: 1 Home Layout: One level Home Equipment: Environmental consultant - 2 wheels;Cane - single point;Shower seat;Bedside commode      Prior Function Level of Independence: Independent with assistive device(s)               Hand Dominance   Dominant Hand: Right    Extremity/Trunk Assessment   Upper Extremity Assessment: Generalized weakness           Lower Extremity Assessment: Generalized weakness      Cervical / Trunk Assessment: Kyphotic  Communication   Communication: No difficulties  Cognition Arousal/Alertness: Awake/alert Behavior During Therapy: WFL for tasks assessed/performed Overall Cognitive Status: Within Functional Limits for tasks assessed                      General Comments      Exercises        Assessment/Plan    PT Assessment All further PT needs can be met in the next venue of care  PT Diagnosis Difficulty walking;Generalized weakness   PT Problem List Decreased strength;Decreased activity tolerance;Decreased balance;Decreased mobility;Decreased knowledge of use of DME  PT Treatment Interventions     PT Goals (Current goals can  be found in the Care Plan section) Acute Rehab PT Goals Patient Stated Goal: home later today PT Goal Formulation: All assessment and education complete, DC therapy    Frequency     Barriers to discharge        Co-evaluation               End of Session Equipment Utilized During Treatment: Gait belt Activity Tolerance: Patient tolerated treatment well Patient left: in chair;with call bell/phone within reach;with family/visitor present           Time: 1027-2536 PT Time Calculation (min): 15 min   Charges:   PT Evaluation $Initial PT Evaluation Tier I: 1  Procedure PT Treatments $Gait Training: 8-22 mins   PT G Codes:          Rebeca Alert, MPT Pager: [phone]

## 2014-01-22 NOTE — Progress Notes (Signed)
Pt Troponin I elevated at 0.33. Dr.Reddy aware. Pt states she has a little pain. Has decreased from before. She is alert and oriented. HR WNL.

## 2014-01-23 DIAGNOSIS — K311 Adult hypertrophic pyloric stenosis: Secondary | ICD-10-CM | POA: Diagnosis not present

## 2014-01-23 DIAGNOSIS — M6281 Muscle weakness (generalized): Secondary | ICD-10-CM | POA: Diagnosis not present

## 2014-01-23 DIAGNOSIS — I129 Hypertensive chronic kidney disease with stage 1 through stage 4 chronic kidney disease, or unspecified chronic kidney disease: Secondary | ICD-10-CM | POA: Diagnosis not present

## 2014-01-23 DIAGNOSIS — K219 Gastro-esophageal reflux disease without esophagitis: Secondary | ICD-10-CM | POA: Diagnosis not present

## 2014-01-26 DIAGNOSIS — K219 Gastro-esophageal reflux disease without esophagitis: Secondary | ICD-10-CM | POA: Diagnosis not present

## 2014-01-26 DIAGNOSIS — K311 Adult hypertrophic pyloric stenosis: Secondary | ICD-10-CM | POA: Diagnosis not present

## 2014-01-27 ENCOUNTER — Telehealth: Payer: Self-pay | Admitting: *Deleted

## 2014-01-27 DIAGNOSIS — K311 Adult hypertrophic pyloric stenosis: Secondary | ICD-10-CM | POA: Diagnosis not present

## 2014-01-27 DIAGNOSIS — K219 Gastro-esophageal reflux disease without esophagitis: Secondary | ICD-10-CM | POA: Diagnosis not present

## 2014-01-27 MED ORDER — HYDRALAZINE HCL 50 MG PO TABS: ORAL_TABLET | ORAL | Status: DC

## 2014-01-27 NOTE — Telephone Encounter (Signed)
Patient daughter Notified and left message on [facility] PT therapist cell phone of changes.

## 2014-01-27 NOTE — Telephone Encounter (Signed)
Don with [facility] called and stated that patients BP is running high at 200/88 and that is with her lying down. Patient was in hospital and released last Thursday and they increased her Hydralazine to four tablets a day.  Should she take an extra tablet? There are no available appointments until Thursday to be seen. Patient is scheduled to come in and see you on 11/3.  Please Advise.

## 2014-01-27 NOTE — Telephone Encounter (Signed)
Change her bp med hydralazine to 75 mg three times a day for now

## 2014-01-28 DIAGNOSIS — K311 Adult hypertrophic pyloric stenosis: Secondary | ICD-10-CM | POA: Diagnosis not present

## 2014-01-28 DIAGNOSIS — K219 Gastro-esophageal reflux disease without esophagitis: Secondary | ICD-10-CM | POA: Diagnosis not present

## 2014-01-30 DIAGNOSIS — K219 Gastro-esophageal reflux disease without esophagitis: Secondary | ICD-10-CM | POA: Diagnosis not present

## 2014-01-30 DIAGNOSIS — K311 Adult hypertrophic pyloric stenosis: Secondary | ICD-10-CM | POA: Diagnosis not present

## 2014-02-02 DIAGNOSIS — K311 Adult hypertrophic pyloric stenosis: Secondary | ICD-10-CM | POA: Diagnosis not present

## 2014-02-02 DIAGNOSIS — K219 Gastro-esophageal reflux disease without esophagitis: Secondary | ICD-10-CM | POA: Diagnosis not present

## 2014-02-03 ENCOUNTER — Ambulatory Visit (INDEPENDENT_AMBULATORY_CARE_PROVIDER_SITE_OTHER): Payer: Medicare Other | Admitting: Internal Medicine

## 2014-02-03 ENCOUNTER — Encounter: Payer: Self-pay | Admitting: Internal Medicine

## 2014-02-03 VITALS — BP 122/70 | HR 56 | Temp 97.5°F | Resp 10 | Ht 66.0 in | Wt 152.0 lb

## 2014-02-03 DIAGNOSIS — I6523 Occlusion and stenosis of bilateral carotid arteries: Secondary | ICD-10-CM

## 2014-02-03 DIAGNOSIS — I639 Cerebral infarction, unspecified: Secondary | ICD-10-CM

## 2014-02-03 DIAGNOSIS — K5901 Slow transit constipation: Secondary | ICD-10-CM | POA: Diagnosis not present

## 2014-02-03 DIAGNOSIS — K2100 Gastro-esophageal reflux disease with esophagitis, without bleeding: Secondary | ICD-10-CM

## 2014-02-03 DIAGNOSIS — D638 Anemia in other chronic diseases classified elsewhere: Secondary | ICD-10-CM

## 2014-02-03 DIAGNOSIS — T182XXS Foreign body in stomach, sequela: Secondary | ICD-10-CM | POA: Diagnosis not present

## 2014-02-03 DIAGNOSIS — Z23 Encounter for immunization: Secondary | ICD-10-CM

## 2014-02-03 DIAGNOSIS — K5900 Constipation, unspecified: Secondary | ICD-10-CM | POA: Insufficient documentation

## 2014-02-03 DIAGNOSIS — N183 Chronic kidney disease, stage 3 unspecified: Secondary | ICD-10-CM

## 2014-02-03 DIAGNOSIS — I6529 Occlusion and stenosis of unspecified carotid artery: Secondary | ICD-10-CM | POA: Insufficient documentation

## 2014-02-03 MED ORDER — POLYETHYLENE GLYCOL 3350 17 GM/SCOOP PO POWD: 17.0000 g | Freq: Two times a day (BID) | ORAL | Status: DC | PRN

## 2014-02-03 NOTE — Progress Notes (Signed)
Patient ID: Shannon [name], female   DOB: March 03, 1924, 80 y.o.   MRN: [id number]    Chief Complaint  Patient presents with  . Hospitalization Follow-up    Seen in the hospital on 01/17/14 DX with Gastric Obstruction   . Immunizations    Pneumonia vaccine update    No Known Allergies  HPI 80 y/o female patient is here for post hospital follow up. She was in the hospital from 01/17/14-01/22/14 with gastric outlet obstruction. She was found to have esophagitis and gastric bezoar on EGD. She was advised to take pureed diet and avoid vegetables that are difficult to digest She was on antibiotics for her uti and has completed the course. She has been tolerating pureed diet well, continues to have heartburn and belching. No other concerns. On review of chart, has lost 7 lbs since last visit  Wt Readings from Last 3 Encounters:  02/03/14 152 lb (68.947 kg)  01/21/14 159 lb 9.8 oz (72.4 kg)  01/07/14 155 lb (70.308 kg)   Review of Systems  Constitutional: Negative for fever, chills, diaphoresis.  HENT: Negative for congestion Respiratory: Negative for cough, sputum production, shortness of breath and wheezing.   Cardiovascular: Negative for chest pain, palpitations Gastrointestinal: Negative for nausea, vomiting, abdominal pain. Has constipation Genitourinary: Negative for dysuria.  Skin: Negative for itching and rash.  Neurological: Negative for dizziness, tingling, focal weakness and headaches.  Psychiatric/Behavioral: Negative for depression   Past Medical History  Diagnosis Date  . Pneumonia   . Thyroid disease   . Seizures   . Stroke   . Hyperlipidemia   . Hypertension   . Diabetes mellitus without complication   . Small bowel obstruction   . Ventral hernia with bowel obstruction    Current Outpatient Prescriptions on File Prior to Visit  Medication Sig Dispense Refill  . acetaminophen (TYLENOL EX ST ARTHRITIS PAIN) 500 MG tablet Take 500 mg by mouth every 6 (six) hours as needed  (for arthritis pain).    [name] antiseptic oral rinse (CPC / CETYLPYRIDINIUM CHLORIDE 0.05%) 0.05 % LIQD solution 7 mLs by Mouth Rinse route 2 times daily at 12 noon and 4 pm. 44 mL 0  . aspirin EC 81 MG tablet Take 1 tablet (81 mg total) by mouth daily.    [name] atorvastatin (LIPITOR) 10 MG tablet Take 10 mg by mouth daily at 6 PM.    . Calcium Carbonate (CALTRATE 600 PO) Take 2 capsules by mouth 2 (two) times daily.    . chlorhexidine (PERIDEX) 0.12 % solution 15 mLs by Mouth Rinse route 2 (two) times daily. 120 mL 0  . cycloSPORINE (RESTASIS) 0.05 % ophthalmic emulsion Place 1 drop into both eyes 2 (two) times daily.    . Dietary Management Product (VASCULERA) TABS Take 1 tablet by mouth daily.    . fluticasone (FLONASE) 50 MCG/ACT nasal spray Place 2 sprays into both nostrils daily.    [name] gabapentin (NEURONTIN) 300 MG capsule Take 300 mg by mouth 3 (three) times daily.    . hydrALAZINE (APRESOLINE) 50 MG tablet Take one and half tablet by mouth three times daily to control blood pressure 120 tablet 3  . isosorbide mononitrate (IMDUR) 30 MG 24 hr tablet Take 30 mg by mouth daily.    [name] l-methylfolate-B6-B12 (METANX) 3-35-2 MG TABS Take one tablet by mouth once daily 30 tablet 3  . levothyroxine (SYNTHROID, LEVOTHROID) 175 MCG tablet Take 175 mcg by mouth daily before breakfast.    . losartan (COZAAR) 100 MG  tablet Take 100 mg by mouth daily.    . magnesium oxide (MAG-OX) 400 MG tablet Take one tablet by mouth twice daily 60 tablet 0  . metoprolol succinate (TOPROL-XL) 50 MG 24 hr tablet Take 50 mg by mouth daily. Take with or immediately following a meal.    . Multiple Vitamins-Minerals (MULTIVITAMIN & MINERAL PO) Take 1 tablet by mouth daily.    . Multiple Vitamins-Minerals (MULTIVITAMIN WITH MINERALS) tablet Take 1 tablet by mouth daily.    . pantoprazole (PROTONIX) 40 MG tablet Take 40 mg by mouth 2 (two) times daily before a meal.    . sennosides-docusate sodium (SENOKOT-S) 8.6-50 MG tablet Take 1  tablet by mouth 2 (two) times daily.     . sertraline (ZOLOFT) 100 MG tablet Take 100 mg by mouth daily.    . sucralfate (CARAFATE) 1 G tablet Take 1 g by mouth 3 (three) times daily with meals.    . traMADol (ULTRAM) 50 MG tablet Take 1 tablet (50 mg total) by mouth every 12 (twelve) hours as needed for severe pain (pain). 30 tablet 0   No current facility-administered medications on file prior to visit.    Physical exam BP 122/70 mmHg  Pulse 56  Temp(Src) 97.5 F (36.4 C) (Oral)  Resp 10  Ht 5\' 6"  (1.676 m)  Wt 152 lb (68.947 kg)  BMI 24.55 kg/m2  SpO2 95%  General- elderly female in no acute distress Head- atraumatic, normocephalic Eyes- PERRLA, EOMI, no pallor, no icterus, no discharge Neck- no lymphadenopathy Mouth- normal mucus membrane Cardiovascular- normal s1,s2, no murmurs Respiratory- bilateral clear to auscultation, no wheeze, no rhonchi, no crackles Abdomen- bowel sounds present, soft, non tender, no guarding or rigidity, no CVA tenderness Musculoskeletal- able to move all 4 extremities Neurological- no focal deficit Skin- warm and dry Psychiatry- alert and oriented to person, place and time, normal mood and affect  Labs Lipid Panel     Component Value Date/Time   CHOL 110 12/24/2013 0456   TRIG 130 12/24/2013 0456   HDL 58 12/24/2013 0456   CHOLHDL 1.9 12/24/2013 0456   VLDL 26 12/24/2013 0456   LDLCALC 26 12/24/2013 0456   Hepatic Function Latest Ref Rng 01/17/2014 12/23/2013 09/30/2013  Total Protein 6.0 - 8.3 g/dL 8.0 7.6 -  Albumin 3.5 - 5.2 g/dL 3.9 4.0 -  AST 0 - 37 U/L 33 22 20  ALT 0 - 35 U/L 16 16 22   Alk Phosphatase 39 - 117 U/L 73 79 74  Total Bilirubin 0.3 - 1.2 mg/dL 0.4 0.3 -     Imaging  Ct Abdomen Pelvis W Contrast  01/17/2014   CLINICAL DATA:  Vomiting. Abdominal pain. History of small bowel obstruction.  EXAM: CT ABDOMEN AND PELVIS WITH CONTRAST  TECHNIQUE: Multidetector CT imaging of the abdomen and pelvis was performed using the  standard protocol following bolus administration of intravenous contrast.  CONTRAST:  50mL OMNIPAQUE IOHEXOL 300 MG/ML SOLN, 80mL OMNIPAQUE IOHEXOL 300 MG/ML SOLN  COMPARISON:  None.  FINDINGS: BODY WALL: Unremarkable.  LOWER CHEST: Cardiomegaly. Gastroesophageal reflux in the setting of gastric outlet obstruction.  ABDOMEN/PELVIS:  Liver: Numerous well circumscribed low-density lesions most consistent with cysts. No indication of malignancy history in the electronic medical record.  Biliary: Cholecystectomy.  Pancreas: Atrophy without acute finding.  Spleen: Incidental sub cm cyst or other lesion in the anterior subcapsular spleen.  Adrenals: Unremarkable.  Kidneys and ureters: Lobulation of both renal cortices consistent with scarring. Bilateral renal atrophy. No hydronephrosis.  Bladder: Unremarkable.  Reproductive: Unremarkable for age.  The pelvic floor appears low.  Bowel: The stomach is massively dilated to the level of the pylorus which is circumferentially thickened (single wall thickness of 15 mm). The mucosa is distinct at the level of the pylorus, with submucosal low-density favoring inflammatory edema. No evidence for perforation or pneumatosis. There is been in enteroenterostomy which is patent. This may be related to electronic medical record history of small bowel obstruction. Sutures noted along the anterior gastric body, likely related to patient's history perforated gastric ulcer. Fluid levels present in the distal colon consistent with diarrhea. Colonic diverticulosis.  Retroperitoneum: No mass or adenopathy.  Peritoneum: No ascites or pneumoperitoneum.  Vascular: No acute abnormality.  OSSEOUS: No acute abnormalities. Remote appearing T11 compression fracture with approximate 50% height loss.  IMPRESSION: 1. High-grade gastric outlet obstruction with pylorus appearance favoring peptic ulcer disease over mass. Follow-up endoscopy or upper GI suggested. 2. Numerous non acute findings are noted  above.   Electronically Signed   By: Tiburcio Pea M.D.   On: 01/17/2014 22:54   EGD10/19/2015 1. Erosive esophagitis 2. 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 No endoscopic evidence for surgical altered anatomy, stomach ulcer, or high-grade stenosis 4. Question motility disorder. Question downstream process. Question what type of surgical procedure recently done and why  Assessment/plan  1. Gastric bezoar, sequela Seen by GI, s/p pulverization.dietary restriction advised for now and resolution over time to be expected. Monitor po intake and weight. If continues to lose weight and symptoms persists, will consider referral to gi for further evaluation  2. CVA (cerebral vascular accident) Stable. Continue asa and statin with bp meds  3. Gastroesophageal reflux disease with esophagitis Continue carafate and increase protonix to 40 mg bid for now. Is s/p oversew repair of ulcer at GJ junction in 6/15  4. CKD (chronic kidney disease) stage 3, GFR 30-59 ml/min Monitor renal function - CMP  5. Anemia of chronic disease Monitor blood count. - CBC with Differential  6. Slow transit constipation Continue her MOM and senna s, add miralax, sample provided, monitor bowel movement  7. Carotid stenosis, bilateral No caroitd doppler in system yet for review, need to schedule one to complete workup  8. Need for pneumococcal vaccine - Pneumococcal conjugate vaccine 13-valent

## 2014-02-04 DIAGNOSIS — K219 Gastro-esophageal reflux disease without esophagitis: Secondary | ICD-10-CM | POA: Diagnosis not present

## 2014-02-04 DIAGNOSIS — K311 Adult hypertrophic pyloric stenosis: Secondary | ICD-10-CM | POA: Diagnosis not present

## 2014-02-04 LAB — COMPREHENSIVE METABOLIC PANEL
ALT: 17 IU/L (ref 0–32)
AST: 28 IU/L (ref 0–40)
Albumin/Globulin Ratio: 1.6 (ref 1.1–2.5)
Albumin: 4.1 g/dL (ref 3.5–4.7)
Alkaline Phosphatase: 64 IU/L (ref 39–117)
BUN/Creatinine Ratio: 16 (ref 11–26)
BUN: 19 mg/dL (ref 8–27)
CO2: 25 mmol/L (ref 18–29)
Calcium: 9.6 mg/dL (ref 8.7–10.3)
Chloride: 83 mmol/L — ABNORMAL LOW (ref 97–108)
Creatinine, Ser: 1.18 mg/dL — ABNORMAL HIGH (ref 0.57–1.00)
GFR calc Af Amer: 48 mL/min/{1.73_m2} — ABNORMAL LOW (ref >59)
GFR calc non Af Amer: 41 mL/min/{1.73_m2} — ABNORMAL LOW (ref >59)
Globulin, Total: 2.5 g/dL (ref 1.5–4.5)
Glucose: 99 mg/dL (ref 65–99)
Potassium: 4.7 mmol/L (ref 3.5–5.2)
Sodium: 120 mmol/L — ABNORMAL LOW (ref 134–144)
Total Bilirubin: 0.4 mg/dL (ref 0.0–1.2)
Total Protein: 6.6 g/dL (ref 6.0–8.5)

## 2014-02-04 LAB — CBC WITH DIFFERENTIAL/PLATELET
Basophils Absolute: 0.1 10*3/uL (ref 0.0–0.2)
Basos: 2 %
Eos: 11 %
Eosinophils Absolute: 0.4 10*3/uL (ref 0.0–0.4)
HCT: 28.2 % — ABNORMAL LOW (ref 34.0–46.6)
Hemoglobin: 10 g/dL — ABNORMAL LOW (ref 11.1–15.9)
Immature Grans (Abs): 0 10*3/uL (ref 0.0–0.1)
Immature Granulocytes: 0 %
Lymphocytes Absolute: 0.8 10*3/uL (ref 0.7–3.1)
Lymphs: 22 %
MCH: 30.9 pg (ref 26.6–33.0)
MCHC: 35.5 g/dL (ref 31.5–35.7)
MCV: 87 fL (ref 79–97)
Monocytes Absolute: 0.4 10*3/uL (ref 0.1–0.9)
Monocytes: 13 %
Neutrophils Absolute: 1.8 10*3/uL (ref 1.4–7.0)
Neutrophils Relative %: 52 %
RBC: 3.24 x10E6/uL — ABNORMAL LOW (ref 3.77–5.28)
RDW: 16 % — ABNORMAL HIGH (ref 12.3–15.4)
WBC: 3.5 10*3/uL (ref 3.4–10.8)

## 2014-02-05 ENCOUNTER — Telehealth: Payer: Self-pay

## 2014-02-05 ENCOUNTER — Encounter: Payer: Self-pay | Admitting: *Deleted

## 2014-02-05 DIAGNOSIS — E871 Hypo-osmolality and hyponatremia: Secondary | ICD-10-CM

## 2014-02-05 MED ORDER — SODIUM CHLORIDE 1 G PO TABS: 1.0000 g | ORAL_TABLET | Freq: Three times a day (TID) | ORAL | Status: DC

## 2014-02-05 NOTE — Telephone Encounter (Signed)
-----   Message from Blanchie Serve, MD sent at 02/04/2014  5:50 PM EST ----- Your blood /anemia count is improved. Your kidney function has worsened with your salt level being low. i want you to be started on salt tablet 1 g three times a day and recheck your bmp in 1 week. If you have any acute/new change of mental status or abnormal jerking body movement, to go to the ED immediately

## 2014-02-05 NOTE — Telephone Encounter (Signed)
Discussed with patient's daughter. Sent RX for salt tablet, copy of report mailed.

## 2014-02-06 ENCOUNTER — Other Ambulatory Visit: Payer: Self-pay | Admitting: *Deleted

## 2014-02-06 MED ORDER — HYDRALAZINE HCL 50 MG PO TABS: ORAL_TABLET | ORAL | Status: DC

## 2014-02-06 NOTE — Telephone Encounter (Signed)
Patient called and requested refill to be faxed to pharmacy. Faxed.

## 2014-02-09 ENCOUNTER — Other Ambulatory Visit: Payer: Self-pay | Admitting: Internal Medicine

## 2014-02-09 DIAGNOSIS — M2011 Hallux valgus (acquired), right foot: Secondary | ICD-10-CM | POA: Diagnosis not present

## 2014-02-09 DIAGNOSIS — L97511 Non-pressure chronic ulcer of other part of right foot limited to breakdown of skin: Secondary | ICD-10-CM | POA: Diagnosis not present

## 2014-02-09 DIAGNOSIS — M19072 Primary osteoarthritis, left ankle and foot: Secondary | ICD-10-CM | POA: Diagnosis not present

## 2014-02-09 DIAGNOSIS — L988 Other specified disorders of the skin and subcutaneous tissue: Secondary | ICD-10-CM | POA: Diagnosis not present

## 2014-02-09 DIAGNOSIS — E119 Type 2 diabetes mellitus without complications: Secondary | ICD-10-CM | POA: Diagnosis not present

## 2014-02-09 DIAGNOSIS — M2012 Hallux valgus (acquired), left foot: Secondary | ICD-10-CM | POA: Diagnosis not present

## 2014-02-09 DIAGNOSIS — E11621 Type 2 diabetes mellitus with foot ulcer: Secondary | ICD-10-CM | POA: Diagnosis not present

## 2014-02-09 DIAGNOSIS — M19071 Primary osteoarthritis, right ankle and foot: Secondary | ICD-10-CM | POA: Diagnosis not present

## 2014-02-10 ENCOUNTER — Other Ambulatory Visit (HOSPITAL_COMMUNITY): Payer: Self-pay | Admitting: Cardiology

## 2014-02-10 DIAGNOSIS — I6523 Occlusion and stenosis of bilateral carotid arteries: Secondary | ICD-10-CM

## 2014-02-11 ENCOUNTER — Encounter: Payer: Self-pay | Admitting: *Deleted

## 2014-02-13 ENCOUNTER — Institutional Professional Consult (permissible substitution): Payer: Medicare Other | Admitting: Cardiology

## 2014-02-16 DIAGNOSIS — K219 Gastro-esophageal reflux disease without esophagitis: Secondary | ICD-10-CM | POA: Diagnosis not present

## 2014-02-16 DIAGNOSIS — K311 Adult hypertrophic pyloric stenosis: Secondary | ICD-10-CM | POA: Diagnosis not present

## 2014-02-17 ENCOUNTER — Other Ambulatory Visit: Payer: Medicare Other

## 2014-02-17 DIAGNOSIS — E871 Hypo-osmolality and hyponatremia: Secondary | ICD-10-CM

## 2014-02-18 ENCOUNTER — Encounter (HOSPITAL_COMMUNITY): Payer: Medicare Other

## 2014-02-18 ENCOUNTER — Encounter: Payer: Self-pay | Admitting: Cardiology

## 2014-02-18 ENCOUNTER — Ambulatory Visit (INDEPENDENT_AMBULATORY_CARE_PROVIDER_SITE_OTHER): Payer: Medicare Other | Admitting: Cardiology

## 2014-02-18 VITALS — BP 160/80 | HR 50 | Ht 66.0 in | Wt 152.0 lb

## 2014-02-18 DIAGNOSIS — R7989 Other specified abnormal findings of blood chemistry: Secondary | ICD-10-CM

## 2014-02-18 DIAGNOSIS — R778 Other specified abnormalities of plasma proteins: Secondary | ICD-10-CM

## 2014-02-18 DIAGNOSIS — R9389 Abnormal findings on diagnostic imaging of other specified body structures: Secondary | ICD-10-CM

## 2014-02-18 DIAGNOSIS — R938 Abnormal findings on diagnostic imaging of other specified body structures: Secondary | ICD-10-CM | POA: Diagnosis not present

## 2014-02-18 DIAGNOSIS — K311 Adult hypertrophic pyloric stenosis: Secondary | ICD-10-CM | POA: Diagnosis not present

## 2014-02-18 DIAGNOSIS — I639 Cerebral infarction, unspecified: Secondary | ICD-10-CM | POA: Diagnosis not present

## 2014-02-18 DIAGNOSIS — K219 Gastro-esophageal reflux disease without esophagitis: Secondary | ICD-10-CM | POA: Diagnosis not present

## 2014-02-18 LAB — BASIC METABOLIC PANEL WITH GFR
BUN/Creatinine Ratio: 21 (ref 11–26)
BUN: 27 mg/dL (ref 8–27)
CO2: 22 mmol/L (ref 18–29)
Calcium: 9.8 mg/dL (ref 8.7–10.3)
Chloride: 89 mmol/L — ABNORMAL LOW (ref 97–108)
Creatinine, Ser: 1.29 mg/dL — ABNORMAL HIGH (ref 0.57–1.00)
GFR calc Af Amer: 43 mL/min/1.73 — ABNORMAL LOW
GFR calc non Af Amer: 37 mL/min/1.73 — ABNORMAL LOW
Glucose: 124 mg/dL — ABNORMAL HIGH (ref 65–99)
Potassium: 5 mmol/L (ref 3.5–5.2)
Sodium: 127 mmol/L — ABNORMAL LOW (ref 134–144)

## 2014-02-18 MED ORDER — ISOSORBIDE MONONITRATE ER 60 MG PO TB24: 60.0000 mg | ORAL_TABLET | Freq: Every day | ORAL | Status: DC

## 2014-02-18 NOTE — Progress Notes (Signed)
Patient ID: Shannon [name], female   DOB: Sep 17, 1923, 80 y.o.   MRN: [id number]    Patient Name: Shannon [name] Date of Encounter: 02/18/2014  Primary Care Provider:  Oneal Grout, MD Primary Cardiologist:  Lars Masson  Problem List   Past Medical History  Diagnosis Date  . Pneumonia   . Thyroid disease   . Seizures   . Stroke   . Hyperlipidemia   . Hypertension   . Diabetes mellitus without complication   . Small bowel obstruction   . Ventral hernia with bowel obstruction    Past Surgical History  Procedure Laterality Date  . Exploratory laparotomy  09-07-2013  . Cholecystectomy    . Tubal ligation    . Colectomy    . Hernia repair  2015  . Esophagogastroduodenoscopy N/A 01/19/2014    Procedure: ESOPHAGOGASTRODUODENOSCOPY (EGD);  Surgeon: Hilarie Fredrickson, MD;  Location: [facility];  Service: Endoscopy;  Laterality: N/A;  . Esophagogastroduodenoscopy N/A 01/20/2014    Procedure: ESOPHAGOGASTRODUODENOSCOPY (EGD);  Surgeon: Hilarie Fredrickson, MD;  Location: [facility];  Service: Endoscopy;  Laterality: N/A;   Allergies  No Known Allergies  HPI  80 year old female who is coming for post-hospitalization follow up, she has h/o diabetes, chronic kidney disease stage III with recent exploratory laparotomy with EGD and resection of gastro-jejunal anastomosis, with current difficult to control hypertension and chest pain. Troponin elevated in the hospital in the settings of BP in the 190-200 systolic range. She has been transferred to the ICU. Metoprolol 5 mg IV has been administered, clonidine 0.3 he has been administered. Hydralazine 10 mg IV has been administered. We were asked for suggestions on reducing blood pressure. Cardiology was consulted and it was believed that troponin elevation is sec to demand ischemia in the settings of hypertensive emergency. She also has h/o pre-syncope, MRI/MRA head neck showed chronic ischemic changes, no acute stroke and ? Left ICA stenosis, follow  up US showed onlt 1-39% stenosis B/L.  The patient is coming for a follow up, she complains of overall fatigue and DOE, no chest pain. BP at home in the 150-160 range.   Home Medications  Prior to Admission medications   Medication Sig Start Date End Date Taking? Authorizing Provider  acetaminophen (TYLENOL EX ST ARTHRITIS PAIN) 500 MG tablet Take 500 mg by mouth every 6 (six) hours as needed (for arthritis pain).   Yes Historical Provider, MD  antiseptic oral rinse (CPC / CETYLPYRIDINIUM CHLORIDE 0.05%) 0.05 % LIQD solution 7 mLs by Mouth Rinse route 2 times daily at 12 noon and 4 pm. 01/22/14  Yes Alison Murray, MD  aspirin EC 81 MG tablet Take 1 tablet (81 mg total) by mouth daily. 12/26/13  Yes Rodolph Bong, MD  atorvastatin (LIPITOR) 10 MG tablet Take 10 mg by mouth daily at 6 PM.   Yes Historical Provider, MD  Calcium Carbonate (CALTRATE 600 PO) Take 2 capsules by mouth 2 (two) times daily.   Yes Historical Provider, MD  chlorhexidine (PERIDEX) 0.12 % solution 15 mLs by Mouth Rinse route 2 (two) times daily. 01/22/14  Yes Alison Murray, MD  cycloSPORINE (RESTASIS) 0.05 % ophthalmic emulsion Place 1 drop into both eyes 2 (two) times daily.   Yes Historical Provider, MD  Dietary Management Product (VASCULERA) TABS Take 1 tablet by mouth daily.   Yes Historical Provider, MD  fluticasone (FLONASE) 50 MCG/ACT nasal spray Place 2 sprays into both nostrils daily.   Yes Historical Provider, MD  gabapentin (NEURONTIN) 300  MG capsule Take 300 mg by mouth 3 (three) times daily.   Yes Historical Provider, MD  hydrALAZINE (APRESOLINE) 50 MG tablet Take one and half tablet by mouth three times daily to control blood pressure 02/06/14  Yes Tiffany L Reed, DO  isosorbide mononitrate (IMDUR) 30 MG 24 hr tablet Take 30 mg by mouth daily.   Yes Historical Provider, MD  l-methylfolate-B6-B12 Latrelle Dodrill) 3-35-2 MG TABS Take one tablet by mouth once daily 01/09/14  Yes Mahima Pandey, MD  levothyroxine  (SYNTHROID, LEVOTHROID) 175 MCG tablet Take 175 mcg by mouth daily before breakfast.   Yes Historical Provider, MD  losartan (COZAAR) 100 MG tablet Take 100 mg by mouth daily.   Yes Historical Provider, MD  magnesium oxide (MAG-OX) 400 (241.3 MG) MG tablet TAKE ONE TABLET BY MOUTH TWICE DAILY 02/10/14  Yes Mahima Pandey, MD  metoprolol succinate (TOPROL-XL) 50 MG 24 hr tablet Take 50 mg by mouth daily. Take with or immediately following a meal.   Yes Historical Provider, MD  Multiple Vitamins-Minerals (MULTIVITAMIN WITH MINERALS) tablet Take 1 tablet by mouth daily.   Yes Historical Provider, MD  pantoprazole (PROTONIX) 40 MG tablet Take 40 mg by mouth 2 (two) times daily before a meal. 02/03/14  Yes Historical Provider, MD  polyethylene glycol powder (GLYCOLAX/MIRALAX) powder Take 17 g by mouth 2 (two) times daily as needed. 02/03/14  Yes Mahima Glade Lloyd, MD  sennosides-docusate sodium (SENOKOT-S) 8.6-50 MG tablet Take 1 tablet by mouth 2 (two) times daily.    Yes Historical Provider, MD  sertraline (ZOLOFT) 100 MG tablet Take 100 mg by mouth daily.   Yes Historical Provider, MD  sodium chloride 1 G tablet Take 1 g by mouth 2 (two) times daily with a meal.  02/05/14  Yes Historical Provider, MD  sucralfate (CARAFATE) 1 G tablet Take 1 g by mouth 3 (three) times daily with meals.   Yes Historical Provider, MD  traMADol (ULTRAM) 50 MG tablet Take 1 tablet (50 mg total) by mouth every 12 (twelve) hours as needed for severe pain (pain). 01/22/14  Yes Alison Murray, MD   Family History  Family History  Problem Relation Age of Onset  . Stroke Mother   . Heart attack Neg Hx   . Hypertension Mother   . Diabetes Brother     Social History  History   Social History  . Marital Status: Married    Spouse Name: N/A    Number of Children: N/A  . Years of Education: N/A   Occupational History  . Not on file.   Social History Main Topics  . Smoking status: Never Smoker   . Smokeless tobacco: Not on  file  . Alcohol Use: No  . Drug Use: No  . Sexual Activity: Not on file   Other Topics Concern  . Not on file   Social History Narrative   Married in [place], lives in one story house with 2 people and no pets   Occupation: ?   Has a living Will, [place], and doesn't have DNR   Was living with Husband (in frail health) in [place] until 09/2013.  After her [place] [facility] admission they both came to live with daughter in [place]     Review of Systems, as per HPI, otherwise negative General:  No chills, fever, night sweats or weight changes.  Cardiovascular:  No chest pain, dyspnea on exertion, edema, orthopnea, palpitations, paroxysmal nocturnal dyspnea. Dermatological: No rash, lesions/masses Respiratory: No cough, dyspnea Urologic: No hematuria, dysuria  Abdominal:   No nausea, vomiting, diarrhea, bright red blood per rectum, melena, or hematemesis Neurologic:  No visual changes, wkns, changes in mental status. All other systems reviewed and are otherwise negative except as noted above.  Physical Exam  Blood pressure 160/80, pulse 50, height 5\' 6"  (1.676 m), weight 152 lb (68.947 kg), SpO2 98 %.  General: Pleasant, NAD Psych: Normal affect. Neuro: Alert and oriented X 3. Moves all extremities spontaneously. HEENT: Normal  Neck: Supple without bruits or JVD. Lungs:  Resp regular and unlabored, CTA. Heart: RRR no s3, s4, or murmurs. Abdomen: Soft, non-tender, non-distended, BS + x 4.  Extremities: No clubbing, cyanosis or edema. DP/PT/Radials 2+ and equal bilaterally.  Labs:  No results for input(s): CKTOTAL, CKMB, TROPONINI in the last 72 hours. Lab Results  Component Value Date   WBC 3.5 02/03/2014   HGB 10.0* 02/03/2014   HCT 28.2* 02/03/2014   MCV 87 02/03/2014   PLT 206 01/22/2014    No results found for: DDIMER Invalid input(s): POCBNP    Component Value Date/Time   NA 127* 02/17/2014 1018   NA 131* 01/22/2014 0520   K 5.0 02/17/2014 1018   CL 89* 02/17/2014 1018    CO2 22 02/17/2014 1018   GLUCOSE 124* 02/17/2014 1018   GLUCOSE 100* 01/22/2014 0520   BUN 27 02/17/2014 1018   BUN 10 01/22/2014 0520   CREATININE 1.29* 02/17/2014 1018   CREATININE 1.0 09/30/2013   CALCIUM 9.8 02/17/2014 1018   PROT 6.6 02/03/2014 1113   PROT 8.0 01/17/2014 2120   ALBUMIN 3.9 01/17/2014 2120   AST 28 02/03/2014 1113   ALT 17 02/03/2014 1113   ALKPHOS 64 02/03/2014 1113   BILITOT 0.4 02/03/2014 1113   GFRNONAA 37* 02/17/2014 1018   GFRAA 43* 02/17/2014 1018   Lab Results  Component Value Date   CHOL 110 12/24/2013   HDL 58 12/24/2013   LDLCALC 26 12/24/2013   TRIG 130 12/24/2013    Accessory Clinical Findings  Echocardiogram -12/24/2013 Left ventricle: The cavity size was mildly dilated. Wall thickness was increased in a pattern of mild LVH. Systolic function was normal. The estimated ejection fraction was in the range of 55% to 60%. - Aortic valve: There was mild to moderate regurgitation. - Mitral valve: There was mild regurgitation. - Left atrium: The atrium was moderately dilated. - Right atrium: The atrium was mildly dilated. - Atrial septum: Mobile and redundant with no obvious PFO.  ECG - NSR, normal ECG  Sono carotids: 12/25/2013 Summary: Bilateral: mild mixed plaque CCA. Moderate calcific plaque origin and proximal ICA and ECA. 1-39% ICA stenosis. Vertebral artery flow is antegrade. ICA/CCA ratio: R-1.2 L-1.21.  Other specific details can be found in the table(s) above. Prepared and Electronically Authenticated by  Leonides Sake, MD 2015-09-24T16:13:21       Assessment & Plan  80 year old with bezoar, post op hypertension, transient chest pain, elevated troponin/demand ischemia in the setting of hypertensive emergency.   Principal Problem:  Gastric outlet obstruction Active Problems:  Essential hypertension, benign  GERD (gastroesophageal reflux disease)  Dyslipidemia  DM (diabetes mellitus) type II uncontrolled,  periph vascular disorder  Diabetic neuropathy  Hypothyroidism  Depression  Anemia of chronic disease  CKD (chronic kidney disease) stage 3, GFR 30-59 ml/min  Gastric bezoar  1) Demand ischemia - during her recent episode of hypertension, troponin isolated mildly elevated 0.34 with subsequent normal troponin. However  The patient reports fatigue and DOE. We will order a Lexiscan nuclear  stress test to rule out ischemia.   2) Hypertension - uncontrolled, I will increase Imdur from 30 to 60 mg po daily.   3) Abnormal MRI, the patient and her daughter have a lot of questions regarding discrepancy between MRI and carotid US that didn't show any significant stenosis. She wants to follow with a neurologist for chronic ischemic changes.   Follow up in 1 month.    Lars Masson, MD, [facility] 02/18/2014, 11:51 AM

## 2014-02-18 NOTE — Patient Instructions (Signed)
Your physician has recommended you make the following change in your medication:   [place] physician has requested that you have a lexiscan myoview. For further information please visit [email or web address]. Please follow instruction sheet, as given.    You have been referred to [facility]   Your physician recommends that you schedule a follow-up appointment in: WITH DR Meda Coffee AFTER YOUR TEST IS COMPLETE

## 2014-02-20 ENCOUNTER — Encounter: Payer: Self-pay | Admitting: *Deleted

## 2014-02-23 ENCOUNTER — Other Ambulatory Visit: Payer: Self-pay | Admitting: *Deleted

## 2014-02-23 ENCOUNTER — Telehealth: Payer: Self-pay | Admitting: Internal Medicine

## 2014-02-23 MED ORDER — SODIUM CHLORIDE 1 G PO TABS: 1.0000 g | ORAL_TABLET | Freq: Two times a day (BID) | ORAL | Status: DC

## 2014-02-23 NOTE — Telephone Encounter (Signed)
Pt's caregiver called - need

## 2014-02-24 ENCOUNTER — Ambulatory Visit (HOSPITAL_COMMUNITY): Payer: Medicare Other | Attending: Cardiology | Admitting: Radiology

## 2014-02-24 VITALS — BP 231/95 | HR 54 | Ht 66.0 in | Wt 150.0 lb

## 2014-02-24 DIAGNOSIS — R0609 Other forms of dyspnea: Secondary | ICD-10-CM | POA: Diagnosis not present

## 2014-02-24 DIAGNOSIS — I10 Essential (primary) hypertension: Secondary | ICD-10-CM | POA: Diagnosis not present

## 2014-02-24 DIAGNOSIS — I4891 Unspecified atrial fibrillation: Secondary | ICD-10-CM | POA: Insufficient documentation

## 2014-02-24 DIAGNOSIS — R079 Chest pain, unspecified: Secondary | ICD-10-CM | POA: Insufficient documentation

## 2014-02-24 DIAGNOSIS — I2510 Atherosclerotic heart disease of native coronary artery without angina pectoris: Secondary | ICD-10-CM | POA: Diagnosis not present

## 2014-02-24 DIAGNOSIS — R7989 Other specified abnormal findings of blood chemistry: Secondary | ICD-10-CM

## 2014-02-24 DIAGNOSIS — R0602 Shortness of breath: Secondary | ICD-10-CM | POA: Insufficient documentation

## 2014-02-24 DIAGNOSIS — R778 Other specified abnormalities of plasma proteins: Secondary | ICD-10-CM

## 2014-02-24 DIAGNOSIS — R0789 Other chest pain: Secondary | ICD-10-CM

## 2014-02-24 DIAGNOSIS — E785 Hyperlipidemia, unspecified: Secondary | ICD-10-CM | POA: Insufficient documentation

## 2014-02-24 MED ORDER — AMINOPHYLLINE 25 MG/ML IV SOLN
75.0000 mg | Freq: Once | INTRAVENOUS | Status: AC
  Administered 2014-02-24: 75 mg via INTRAVENOUS

## 2014-02-24 MED ORDER — REGADENOSON 0.4 MG/5ML IV SOLN
0.4000 mg | Freq: Once | INTRAVENOUS | Status: AC
  Administered 2014-02-24: 0.4 mg via INTRAVENOUS

## 2014-02-24 MED ORDER — TECHNETIUM TC 99M SESTAMIBI GENERIC - CARDIOLITE
33.0000 | Freq: Once | INTRAVENOUS | Status: AC | PRN
Start: 2014-02-24 — End: 2014-02-24
  Administered 2014-02-24: 33 via INTRAVENOUS

## 2014-02-24 MED ORDER — TECHNETIUM TC 99M SESTAMIBI GENERIC - CARDIOLITE
11.0000 | Freq: Once | INTRAVENOUS | Status: AC | PRN
  Administered 2014-02-24: 11 via INTRAVENOUS

## 2014-02-24 NOTE — Progress Notes (Signed)
[facility] [address] [place], [place] [address] [phone]    Cardiology Nuclear Med Study  Shannon [name] is a 80 y.o. female     MRN : [id number]     DOB: Jan 24, 1924  Procedure Date: 02/24/2014  Nuclear Med Background Indication for Stress Test:  Evaluation for Ischemia History:  CAD, Hx. seizures Cardiac Risk Factors: Carotid Disease, Hypertension, Lipids and Atrial Fib  Symptoms:  Chest Pain and SOB   Nuclear Pre-Procedure Caffeine/Decaff Intake:  None NPO After: 7:00pm   Lungs:  clear O2 Sat: 98% on room air. IV 0.9% NS with Angio Cath:  22g  IV Site: R Hand  IV Started by:  Matilde Haymaker, RN  Chest Size (in):  40 Cup Size: C  Height: 5\' 6"  (1.676 m)  Weight:  150 lb (68.04 kg)  BMI:  Body mass index is 24.22 kg/(m^2). Tech Comments:  Metoprolol taken at 1000 today    Nuclear Med Study 1 or 2 day study: 1 day  Stress Test Type:  Lexiscan  Reading MD: n/a  Order Authorizing Provider:  Filiberto Pinks   Resting Radionuclide: Technetium 79m Sestamibi  Resting Radionuclide Dose: 10.6 mCi   Stress Radionuclide:  Technetium 62m Sestamibi  Stress Radionuclide Dose: 33.0 mCi           Stress Protocol Rest HR: 54 Stress HR: 75  Rest BP: 231/95 Stress BP: 162/57  Exercise Time (min): n/a METS: n/a   Predicted Max HR: 132 bpm % Max HR: 56.82 bpm Rate Pressure Product: 15600   Dose of Adenosine (mg):  n/a Dose of Lexiscan: 0.4 mg  Dose of Atropine (mg): n/a Dose of Dobutamine: n/a mcg/kg/min (at max HR)  Stress Test Technologist: Glade Lloyd, BS-ES  Nuclear Technologist:  Vedia Pereyra, CNMT     Rest Procedure:  Myocardial perfusion imaging was performed at rest 45 minutes following the intravenous administration of Technetium 28m Sestamibi. Rest ECG: SB 54bpm otherwise normal.   Stress Procedure:  The patient received IV Lexiscan 0.4 mg over 15-seconds.  Technetium 27m Sestamibi injected at 30-seconds.  Quantitative spect  images were obtained after a 45 minute delay.  During the infusion of Lexiscan the patient complained of fatigue, headache and chest tightness.  The chest tightness persisted so 75 mg aminophylline was given.  The patients symptoms began to ease after a few minutes.  Stress ECG: No significant change from baseline ECG  QPS Raw Data Images:  Normal; no motion artifact; normal heart/lung ratio. Stress Images:  There is mild apical thinning with normal uptake in other regions. Rest Images:  There is mild apical thinning with normal uptake in other regions. Subtraction (SDS):  No evidence of ischemia. Transient Ischemic Dilatation (Normal <1.22):  1.08 Lung/Heart Ratio (Normal <0.45):  0.38  Quantitative Gated Spect Images QGS EDV:  78 ml QGS ESV:  28 ml  Impression Exercise Capacity:  Lexiscan with no exercise. BP Response:  Normal blood pressure response. Clinical Symptoms:  Typical symptoms with Lexiscan.  ECG Impression:  No significant ST segment change suggestive of ischemia. Comparison with Prior Nuclear Study: No previous nuclear study performed  Overall Impression:  Low risk stress nuclear study with no ischemia. .  LV Ejection Fraction: 64%.  LV Wall Motion:  NL LV Function; NL Wall Motion  Candee Furbish, MD

## 2014-03-02 DIAGNOSIS — H26491 Other secondary cataract, right eye: Secondary | ICD-10-CM | POA: Diagnosis not present

## 2014-03-02 DIAGNOSIS — H4011X2 Primary open-angle glaucoma, moderate stage: Secondary | ICD-10-CM | POA: Diagnosis not present

## 2014-03-02 DIAGNOSIS — H4011X1 Primary open-angle glaucoma, mild stage: Secondary | ICD-10-CM | POA: Diagnosis not present

## 2014-03-02 DIAGNOSIS — E119 Type 2 diabetes mellitus without complications: Secondary | ICD-10-CM | POA: Diagnosis not present

## 2014-03-02 DIAGNOSIS — H26493 Other secondary cataract, bilateral: Secondary | ICD-10-CM | POA: Diagnosis not present

## 2014-03-02 DIAGNOSIS — H26492 Other secondary cataract, left eye: Secondary | ICD-10-CM | POA: Diagnosis not present

## 2014-03-04 ENCOUNTER — Encounter: Payer: Medicare Other | Admitting: Internal Medicine

## 2014-03-05 ENCOUNTER — Ambulatory Visit (INDEPENDENT_AMBULATORY_CARE_PROVIDER_SITE_OTHER): Payer: Medicare Other | Admitting: Nurse Practitioner

## 2014-03-05 ENCOUNTER — Encounter: Payer: Self-pay | Admitting: Nurse Practitioner

## 2014-03-05 VITALS — BP 220/100 | HR 65 | Temp 97.6°F | Resp 18

## 2014-03-05 DIAGNOSIS — I10 Essential (primary) hypertension: Secondary | ICD-10-CM | POA: Diagnosis not present

## 2014-03-05 DIAGNOSIS — I639 Cerebral infarction, unspecified: Secondary | ICD-10-CM | POA: Diagnosis not present

## 2014-03-05 NOTE — Progress Notes (Signed)
Patient ID: Shannon [name], female   DOB: 10-Mar-1924, 79 y.o.   MRN: [id number]    PCP: Oneal Grout, MD  No Known Allergies  Chief Complaint  Patient presents with  . Acute Visit    High BP     HPI: Patient is a 80 y.o. female seen in the office today due to elevated blood pressure. Reports blood pressure was running over 200 at home. Reports she has not been feeling good. Been feeling woozy. Has been feeling dizzy. No headache. No worsening vision.  Reports she has taken all medication today. Has taken hydralazine, cozaar, Toprol, imdur. Currently taking sodium tablets for low sodium  No chest pains, no current shortness of breath but will fatigue and get short of breath easily with activity.  Review of Systems:  Review of Systems  Constitutional: Negative for activity change, appetite change, fatigue and unexpected weight change.  HENT: Negative for congestion and hearing loss.   Eyes: Negative.   Respiratory: Negative for cough and shortness of breath.   Cardiovascular: Positive for leg swelling (some). Negative for chest pain and palpitations.  Gastrointestinal: Positive for constipation. Negative for abdominal pain and diarrhea.  Genitourinary: Negative for dysuria and difficulty urinating.  Musculoskeletal: Negative for myalgias and arthralgias.  Skin: Negative for color change and wound.  Neurological: Positive for dizziness and weakness.  Psychiatric/Behavioral: Positive for confusion (memory loss). Negative for behavioral problems and agitation.    Past Medical History  Diagnosis Date  . Pneumonia   . Thyroid disease   . Seizures   . Stroke   . Hyperlipidemia   . Hypertension   . Diabetes mellitus without complication   . Small bowel obstruction   . Ventral hernia with bowel obstruction    Past Surgical History  Procedure Laterality Date  . Exploratory laparotomy  09-07-2013  . Cholecystectomy    . Tubal ligation    . Colectomy    . Hernia repair  2015  .  Esophagogastroduodenoscopy N/A 01/19/2014    Procedure: ESOPHAGOGASTRODUODENOSCOPY (EGD);  Surgeon: Hilarie Fredrickson, MD;  Location: [facility];  Service: Endoscopy;  Laterality: N/A;  . Esophagogastroduodenoscopy N/A 01/20/2014    Procedure: ESOPHAGOGASTRODUODENOSCOPY (EGD);  Surgeon: Hilarie Fredrickson, MD;  Location: [facility];  Service: Endoscopy;  Laterality: N/A;   Social History:   reports that she has never smoked. She does not have any smokeless tobacco history on file. She reports that she does not drink alcohol or use illicit drugs.  Family History  Problem Relation Age of Onset  . Stroke Mother   . Heart attack Neg Hx   . Hypertension Mother   . Diabetes Brother     Medications: Patient's Medications  New Prescriptions   No medications on file  Previous Medications   ACETAMINOPHEN (TYLENOL EX ST ARTHRITIS PAIN) 500 MG TABLET    Take 500 mg by mouth every 6 (six) hours as needed (for arthritis pain).   ANTISEPTIC ORAL RINSE (CPC / CETYLPYRIDINIUM CHLORIDE 0.05%) 0.05 % LIQD SOLUTION    7 mLs by Mouth Rinse route 2 times daily at 12 noon and 4 pm.   ASPIRIN EC 81 MG TABLET    Take 1 tablet (81 mg total) by mouth daily.   ATORVASTATIN (LIPITOR) 10 MG TABLET    Take 10 mg by mouth daily at 6 PM.   CALCIUM CARBONATE (CALTRATE 600 PO)    Take 2 capsules by mouth 2 (two) times daily.   CHLORHEXIDINE (PERIDEX) 0.12 % SOLUTION  15 mLs by Mouth Rinse route 2 (two) times daily.   CYCLOSPORINE (RESTASIS) 0.05 % OPHTHALMIC EMULSION    Place 1 drop into both eyes 2 (two) times daily.   DIETARY MANAGEMENT PRODUCT (VASCULERA) TABS    Take 1 tablet by mouth daily.   FLUTICASONE (FLONASE) 50 MCG/ACT NASAL SPRAY    Place 2 sprays into both nostrils daily.   GABAPENTIN (NEURONTIN) 300 MG CAPSULE    Take 300 mg by mouth 3 (three) times daily.   HYDRALAZINE (APRESOLINE) 50 MG TABLET    Take one and half tablet by mouth three times daily to control blood pressure   ISOSORBIDE MONONITRATE (IMDUR) 60  MG 24 HR TABLET    Take 1 tablet (60 mg total) by mouth daily.   L-METHYLFOLATE-B6-B12 (METANX) 3-35-2 MG TABS    Take one tablet by mouth once daily   LEVOTHYROXINE (SYNTHROID, LEVOTHROID) 175 MCG TABLET    Take 175 mcg by mouth daily before breakfast.   LOSARTAN (COZAAR) 100 MG TABLET    Take 100 mg by mouth daily.   MAGNESIUM OXIDE (MAG-OX) 400 (241.3 MG) MG TABLET    TAKE ONE TABLET BY MOUTH TWICE DAILY   METOPROLOL SUCCINATE (TOPROL-XL) 50 MG 24 HR TABLET    Take 50 mg by mouth daily. Take with or immediately following a meal.   MULTIPLE VITAMINS-MINERALS (MULTIVITAMIN WITH MINERALS) TABLET    Take 1 tablet by mouth daily.   PANTOPRAZOLE (PROTONIX) 40 MG TABLET    Take 40 mg by mouth 2 (two) times daily before a meal.   POLYETHYLENE GLYCOL POWDER (GLYCOLAX/MIRALAX) POWDER    Take 17 g by mouth 2 (two) times daily as needed.   SENNOSIDES-DOCUSATE SODIUM (SENOKOT-S) 8.6-50 MG TABLET    Take 1 tablet by mouth 2 (two) times daily.    SERTRALINE (ZOLOFT) 100 MG TABLET    Take 100 mg by mouth daily.   SODIUM CHLORIDE 1 G TABLET    Take 1 tablet (1 g total) by mouth 2 (two) times daily with a meal.   SUCRALFATE (CARAFATE) 1 G TABLET    Take 1 g by mouth 3 (three) times daily with meals.   TRAMADOL (ULTRAM) 50 MG TABLET    Take 1 tablet (50 mg total) by mouth every 12 (twelve) hours as needed for severe pain (pain).  Modified Medications   No medications on file  Discontinued Medications   No medications on file     Physical Exam:  Filed Vitals:   03/05/14 1534  BP: 220/100  Pulse: 65  Temp: 97.6 F (36.4 C)  TempSrc: Oral  Resp: 18  SpO2: 87%    Physical Exam  Constitutional: She appears well-developed and well-nourished. No distress.  HENT:  Head: Normocephalic and atraumatic.  Mouth/Throat: Oropharynx is clear and moist. No oropharyngeal exudate.  Eyes: Conjunctivae are normal. Pupils are equal, round, and reactive to light.  Neck: Normal range of motion. Neck supple.    Cardiovascular: Normal rate, regular rhythm and normal heart sounds.   Pulmonary/Chest: Effort normal and breath sounds normal.  Abdominal: Soft. Bowel sounds are normal.  Musculoskeletal: She exhibits edema (trace bilaterally). She exhibits no tenderness.  Neurological: She is alert.  Skin: Skin is warm and dry. She is not diaphoretic.    Labs reviewed: Basic Metabolic Panel:  Recent Labs  16/10/96  01/22/14 0520 02/03/14 1113 02/17/14 1018  NA 127*  < > 131* 120* 127*  K 4.3  < > 3.8 4.7 5.0  CL  --   < >  100 83* 89*  CO2  --   < > 21 25 22   GLUCOSE  --   < > 100* 99 124*  BUN 19  < > 10 19 27   CREATININE 1.0  < > 1.14* 1.18* 1.29*  CALCIUM  --   < > 8.9 9.6 9.8  MG 1.5*  --   --   --   --   < > = values in this interval not displayed. Liver Function Tests:  Recent Labs  12/23/13 2019 01/17/14 2120 02/03/14 1113  AST 22 33 28  ALT 16 16 17   ALKPHOS 79 73 64  BILITOT 0.3 0.4 0.4  PROT 7.6 8.0 6.6  ALBUMIN 4.0 3.9  --     Recent Labs  01/17/14 2120  LIPASE 91*   No results for input(s): AMMONIA in the last 8760 hours. CBC:  Recent Labs  01/17/14 2120 01/20/14 0510 01/22/14 0520 02/03/14 1113  WBC 6.9 7.6 9.9 3.5  NEUTROABS 3.8  --   --  1.8  HGB 11.1* 10.6* 9.2* 10.0*  HCT 32.9* 31.1* 26.7* 28.2*  MCV 90.1 90.1 88.7 87  PLT 227 235 206  --    Lipid Panel:  Recent Labs  12/24/13 0456  CHOL 110  HDL 58  LDLCALC 26  TRIG 130  CHOLHDL 1.9   TSH: No results for input(s): TSH in the last 8760 hours. A1C: Lab Results  Component Value Date   HGBA1C 6.9* 12/24/2013     Assessment/Plan  1. Accelerated hypertension -Clonidine 0.1 given in office, blood pressure improved to 190/90, discussed with Dr Renato Gails in office. Will stop Cozaar (which can cause decrease in sodium)  and sodium (which can elevate blood pressure. Will increase hydralazine to 100 mg TID at this time, will follow up blood work today, will have pts daughter take and record bp.  Return precautions discussed and when to seek emergency attention. Pt and daughter agree and understand. Will follow up in 1 week  - Basic metabolic panel

## 2014-03-05 NOTE — Patient Instructions (Signed)
To stop Cozaar and sodium tablets  To increase hydralazine to 100 mg three times daily  Will get blood work today

## 2014-03-06 LAB — BASIC METABOLIC PANEL
BUN/Creatinine Ratio: 19 (ref 11–26)
BUN: 22 mg/dL (ref 8–27)
CO2: 24 mmol/L (ref 18–29)
Calcium: 10 mg/dL (ref 8.7–10.3)
Chloride: 95 mmol/L — ABNORMAL LOW (ref 97–108)
Creatinine, Ser: 1.13 mg/dL — ABNORMAL HIGH (ref 0.57–1.00)
GFR calc Af Amer: 50 mL/min/{1.73_m2} — ABNORMAL LOW (ref >59)
GFR calc non Af Amer: 43 mL/min/{1.73_m2} — ABNORMAL LOW (ref >59)
Glucose: 103 mg/dL — ABNORMAL HIGH (ref 65–99)
Potassium: 4.2 mmol/L (ref 3.5–5.2)
Sodium: 132 mmol/L — ABNORMAL LOW (ref 134–144)

## 2014-03-07 ENCOUNTER — Emergency Department (HOSPITAL_COMMUNITY): Payer: Medicare Other

## 2014-03-07 ENCOUNTER — Emergency Department (HOSPITAL_COMMUNITY)
Admission: EM | Admit: 2014-03-07 | Discharge: 2014-03-07 | Disposition: A | Discharge status: Home / Self Care | Payer: Medicare Other | Attending: Emergency Medicine | Admitting: Emergency Medicine

## 2014-03-07 ENCOUNTER — Other Ambulatory Visit: Payer: Self-pay

## 2014-03-07 ENCOUNTER — Encounter (HOSPITAL_COMMUNITY): Payer: Self-pay | Admitting: Emergency Medicine

## 2014-03-07 DIAGNOSIS — E119 Type 2 diabetes mellitus without complications: Secondary | ICD-10-CM | POA: Insufficient documentation

## 2014-03-07 DIAGNOSIS — Z7982 Long term (current) use of aspirin: Secondary | ICD-10-CM | POA: Insufficient documentation

## 2014-03-07 DIAGNOSIS — K6289 Other specified diseases of anus and rectum: Secondary | ICD-10-CM | POA: Insufficient documentation

## 2014-03-07 DIAGNOSIS — R109 Unspecified abdominal pain: Secondary | ICD-10-CM

## 2014-03-07 DIAGNOSIS — I10 Essential (primary) hypertension: Secondary | ICD-10-CM | POA: Insufficient documentation

## 2014-03-07 DIAGNOSIS — Z79899 Other long term (current) drug therapy: Secondary | ICD-10-CM | POA: Diagnosis not present

## 2014-03-07 DIAGNOSIS — G40909 Epilepsy, unspecified, not intractable, without status epilepticus: Secondary | ICD-10-CM | POA: Insufficient documentation

## 2014-03-07 DIAGNOSIS — Z9851 Tubal ligation status: Secondary | ICD-10-CM | POA: Diagnosis not present

## 2014-03-07 DIAGNOSIS — E079 Disorder of thyroid, unspecified: Secondary | ICD-10-CM | POA: Insufficient documentation

## 2014-03-07 DIAGNOSIS — Z8701 Personal history of pneumonia (recurrent): Secondary | ICD-10-CM | POA: Insufficient documentation

## 2014-03-07 DIAGNOSIS — Z8673 Personal history of transient ischemic attack (TIA), and cerebral infarction without residual deficits: Secondary | ICD-10-CM | POA: Diagnosis not present

## 2014-03-07 DIAGNOSIS — E785 Hyperlipidemia, unspecified: Secondary | ICD-10-CM | POA: Insufficient documentation

## 2014-03-07 DIAGNOSIS — Z7951 Long term (current) use of inhaled steroids: Secondary | ICD-10-CM | POA: Diagnosis not present

## 2014-03-07 DIAGNOSIS — Z9049 Acquired absence of other specified parts of digestive tract: Secondary | ICD-10-CM | POA: Diagnosis not present

## 2014-03-07 DIAGNOSIS — K310 Acute dilatation of stomach: Secondary | ICD-10-CM | POA: Diagnosis not present

## 2014-03-07 LAB — URINALYSIS, ROUTINE W REFLEX MICROSCOPIC
Bilirubin Urine: NEGATIVE
Glucose, UA: NEGATIVE mg/dL
Ketones, ur: NEGATIVE mg/dL
Leukocytes, UA: NEGATIVE
Nitrite: NEGATIVE
Protein, ur: 100 mg/dL — AB
Specific Gravity, Urine: 1.013 (ref 1.005–1.030)
Urobilinogen, UA: 0.2 mg/dL (ref 0.0–1.0)
pH: 8 (ref 5.0–8.0)

## 2014-03-07 LAB — CBC WITH DIFFERENTIAL/PLATELET
Basophils Absolute: 0 10*3/uL (ref 0.0–0.1)
Basophils Relative: 1 % (ref 0–1)
Eosinophils Absolute: 0.2 10*3/uL (ref 0.0–0.7)
Eosinophils Relative: 4 % (ref 0–5)
HCT: 30.3 % — ABNORMAL LOW (ref 36.0–46.0)
Hemoglobin: 10.3 g/dL — ABNORMAL LOW (ref 12.0–15.0)
Lymphocytes Relative: 16 % (ref 12–46)
Lymphs Abs: 0.8 10*3/uL (ref 0.7–4.0)
MCH: 30.2 pg (ref 26.0–34.0)
MCHC: 34 g/dL (ref 30.0–36.0)
MCV: 88.9 fL (ref 78.0–100.0)
Monocytes Absolute: 0.7 10*3/uL (ref 0.1–1.0)
Monocytes Relative: 13 % — ABNORMAL HIGH (ref 3–12)
Neutro Abs: 3.3 10*3/uL (ref 1.7–7.7)
Neutrophils Relative %: 66 % (ref 43–77)
Platelets: 276 10*3/uL (ref 150–400)
RBC: 3.41 MIL/uL — ABNORMAL LOW (ref 3.87–5.11)
RDW: 15.8 % — ABNORMAL HIGH (ref 11.5–15.5)
WBC: 5 10*3/uL (ref 4.0–10.5)

## 2014-03-07 LAB — COMPREHENSIVE METABOLIC PANEL
ALT: 20 U/L (ref 0–35)
AST: 28 U/L (ref 0–37)
Albumin: 4 g/dL (ref 3.5–5.2)
Alkaline Phosphatase: 69 U/L (ref 39–117)
Anion gap: 15 (ref 5–15)
BUN: 22 mg/dL (ref 6–23)
CO2: 22 mEq/L (ref 19–32)
Calcium: 10.5 mg/dL (ref 8.4–10.5)
Chloride: 92 mEq/L — ABNORMAL LOW (ref 96–112)
Creatinine, Ser: 1.12 mg/dL — ABNORMAL HIGH (ref 0.50–1.10)
GFR calc Af Amer: 49 mL/min — ABNORMAL LOW (ref >90)
GFR calc non Af Amer: 43 mL/min — ABNORMAL LOW (ref >90)
Glucose, Bld: 128 mg/dL — ABNORMAL HIGH (ref 70–99)
Potassium: 4.6 mEq/L (ref 3.7–5.3)
Sodium: 129 mEq/L — ABNORMAL LOW (ref 137–147)
Total Bilirubin: 0.6 mg/dL (ref 0.3–1.2)
Total Protein: 7.8 g/dL (ref 6.0–8.3)

## 2014-03-07 LAB — LIPASE, BLOOD: Lipase: 41 U/L (ref 11–59)

## 2014-03-07 LAB — URINE MICROSCOPIC-ADD ON

## 2014-03-07 LAB — POC OCCULT BLOOD, ED: Fecal Occult Bld: NEGATIVE

## 2014-03-07 LAB — TROPONIN I: Troponin I: <0.3 ng/mL (ref <0.30)

## 2014-03-07 LAB — I-STAT CG4 LACTIC ACID, ED: Lactic Acid, Venous: 0.92 mmol/L (ref 0.5–2.2)

## 2014-03-07 MED ORDER — IOHEXOL 300 MG/ML  SOLN
50.0000 mL | Freq: Once | INTRAMUSCULAR | Status: AC | PRN
  Administered 2014-03-07: 50 mL via ORAL

## 2014-03-07 MED ORDER — IOHEXOL 300 MG/ML  SOLN
80.0000 mL | Freq: Once | INTRAMUSCULAR | Status: AC | PRN
  Administered 2014-03-07: 80 mL via INTRAVENOUS

## 2014-03-07 NOTE — Discharge Instructions (Signed)

## 2014-03-07 NOTE — ED Provider Notes (Signed)
CSN: [id number]     Arrival date & time 03/07/14  1031 History   First MD Initiated Contact with Patient 03/07/14 1107     Chief Complaint  Patient presents with  . Rectal Pain     (Consider location/radiation/quality/duration/timing/severity/associated sxs/prior Treatment) HPI Comments: Patient presents to the ER for evaluation of abdominal pain. Patient reports that she has been having pain and pressure in the low abdomen. She has noticed a pain in her rectum and vagina area as well. She reports a burning when she urinates, but then after she urinates the symptoms improved. She has had mild nausea but there has not been any vomiting. Patient denies diarrhea and rectal bleeding. She has not been having normal bowel movements, feels like she is going less frequently. Patient reports that she was admitted last month with a blockage.   Past Medical History  Diagnosis Date  . Pneumonia   . Thyroid disease   . Seizures   . Stroke   . Hyperlipidemia   . Hypertension   . Diabetes mellitus without complication   . Small bowel obstruction   . Ventral hernia with bowel obstruction    Past Surgical History  Procedure Laterality Date  . Exploratory laparotomy  09-07-2013  . Cholecystectomy    . Tubal ligation    . Colectomy    . Hernia repair  2015  . Esophagogastroduodenoscopy N/A 01/19/2014    Procedure: ESOPHAGOGASTRODUODENOSCOPY (EGD);  Surgeon: Irene Shipper, MD;  Location: [facility];  Service: Endoscopy;  Laterality: N/A;  . Esophagogastroduodenoscopy N/A 01/20/2014    Procedure: ESOPHAGOGASTRODUODENOSCOPY (EGD);  Surgeon: Irene Shipper, MD;  Location: [facility];  Service: Endoscopy;  Laterality: N/A;   Family History  Problem Relation Age of Onset  . Stroke Mother   . Heart attack Neg Hx   . Hypertension Mother   . Diabetes Brother    History  Substance Use Topics  . Smoking status: Never Smoker   . Smokeless tobacco: Not on file  . Alcohol Use: No   OB History     No data available     Review of Systems  Gastrointestinal: Positive for abdominal pain and rectal pain.  Genitourinary: Positive for pelvic pain.  All other systems reviewed and are negative.     Allergies  Review of patient's allergies indicates no known allergies.  Home Medications   Prior to Admission medications   Medication Sig Start Date End Date Taking? Authorizing Provider  acetaminophen (TYLENOL EX ST ARTHRITIS PAIN) 500 MG tablet Take 500 mg by mouth every 6 (six) hours as needed (for arthritis pain).   Yes Historical Provider, MD  aspirin EC 81 MG tablet Take 81 mg by mouth every morning.   Yes Historical Provider, MD  atorvastatin (LIPITOR) 10 MG tablet Take 10 mg by mouth daily at 6 PM.   Yes Historical Provider, MD  B Complex-C (B-COMPLEX WITH VITAMIN C) tablet Take 1 tablet by mouth 2 (two) times daily.   Yes Historical Provider, MD  Calcium Carbonate (CALTRATE 600 PO) Take 1 tablet by mouth 2 (two) times daily.   Yes Historical Provider, MD  calcium-vitamin D (OSCAL WITH D) 500-200 MG-UNIT per tablet Take 1 tablet by mouth 2 (two) times daily.   Yes Historical Provider, MD  cycloSPORINE (RESTASIS) 0.05 % ophthalmic emulsion Place 1 drop into both eyes 2 (two) times daily.   Yes Historical Provider, MD  Dietary Management Product (VASCULERA) TABS Take 1 tablet by mouth daily.   Yes Historical  Provider, MD  fluticasone (FLONASE) 50 MCG/ACT nasal spray Place 2 sprays into both nostrils daily.   Yes Historical Provider, MD  gabapentin (NEURONTIN) 300 MG capsule Take 300 mg by mouth 3 (three) times daily.   Yes Historical Provider, MD  hydrALAZINE (APRESOLINE) 100 MG tablet Take 100 mg by mouth 3 (three) times daily.   Yes Historical Provider, MD  isosorbide mononitrate (IMDUR) 60 MG 24 hr tablet Take 60 mg by mouth every morning.   Yes Historical Provider, MD  l-methylfolate-B6-B12 (METANX) 3-35-2 MG TABS Take 1 tablet by mouth every morning.   Yes Historical Provider, MD   levothyroxine (SYNTHROID, LEVOTHROID) 175 MCG tablet Take 175 mcg by mouth daily before breakfast.   Yes Historical Provider, MD  metoprolol succinate (TOPROL-XL) 50 MG 24 hr tablet Take 50 mg by mouth every morning. Take with or immediately following a meal.   Yes Historical Provider, MD  Multiple Vitamins-Minerals (MULTIVITAMIN WITH MINERALS) tablet Take 1 tablet by mouth every morning.    Yes Historical Provider, MD  pantoprazole (PROTONIX) 40 MG tablet Take 40 mg by mouth 2 (two) times daily before a meal. 02/03/14  Yes Historical Provider, MD  polyethylene glycol (MIRALAX / GLYCOLAX) packet Take 17 g by mouth daily as needed for mild constipation.   Yes Historical Provider, MD  sennosides-docusate sodium (SENOKOT-S) 8.6-50 MG tablet Take 1 tablet by mouth 2 (two) times daily.    Yes Historical Provider, MD  sertraline (ZOLOFT) 100 MG tablet Take 100 mg by mouth every morning.    Yes Historical Provider, MD  sucralfate (CARAFATE) 1 G tablet Take 1 g by mouth 3 (three) times daily with meals.   Yes Historical Provider, MD  traMADol (ULTRAM) 50 MG tablet Take 50 mg by mouth 2 (two) times daily as needed.   Yes Historical Provider, MD   BP 217/84 mmHg  Pulse 66  Temp(Src) 98.6 F (37 C) (Oral)  Resp 16  SpO2 97% Physical Exam  Constitutional: She is oriented to person, place, and time. She appears well-developed and well-nourished. No distress.  HENT:  Head: Normocephalic and atraumatic.  Right Ear: Hearing normal.  Left Ear: Hearing normal.  Nose: Nose normal.  Mouth/Throat: Oropharynx is clear and moist and mucous membranes are normal.  Eyes: Conjunctivae and EOM are normal. Pupils are equal, round, and reactive to light.  Neck: Normal range of motion. Neck supple.  Cardiovascular: Regular rhythm, S1 normal and S2 normal.  Exam reveals no gallop and no friction rub.   No murmur heard. Pulmonary/Chest: Effort normal and breath sounds normal. No respiratory distress. She exhibits no  tenderness.  Abdominal: Soft. Normal appearance and bowel sounds are normal. There is no hepatosplenomegaly. There is no tenderness. There is no rebound, no guarding, no tenderness at McBurney's point and negative Murphy's sign. No hernia.  Genitourinary: Rectal exam shows no external hemorrhoid, no mass, no tenderness and anal tone normal.  Musculoskeletal: Normal range of motion.  Neurological: She is alert and oriented to person, place, and time. She has normal strength. No cranial nerve deficit or sensory deficit. Coordination normal. GCS eye subscore is 4. GCS verbal subscore is 5. GCS motor subscore is 6.  Skin: Skin is warm, dry and intact. No rash noted. No cyanosis.  Psychiatric: She has a normal mood and affect. Her speech is normal and behavior is normal. Thought content normal.  Nursing note and vitals reviewed.   ED Course  Procedures (including critical care time) Labs Review Labs Reviewed  CBC  WITH DIFFERENTIAL - Abnormal; Notable for the following:    RBC 3.41 (*)    Hemoglobin 10.3 (*)    HCT 30.3 (*)    RDW 15.8 (*)    Monocytes Relative 13 (*)    All other components within normal limits  COMPREHENSIVE METABOLIC PANEL - Abnormal; Notable for the following:    Sodium 129 (*)    Chloride 92 (*)    Glucose, Bld 128 (*)    Creatinine, Ser 1.12 (*)    GFR calc non Af Amer 43 (*)    GFR calc Af Amer 49 (*)    All other components within normal limits  URINALYSIS, ROUTINE W REFLEX MICROSCOPIC - Abnormal; Notable for the following:    Hgb urine dipstick TRACE (*)    Protein, ur 100 (*)    All other components within normal limits  URINE MICROSCOPIC-ADD ON - Abnormal; Notable for the following:    Bacteria, UA FEW (*)    All other components within normal limits  URINE CULTURE  LIPASE, BLOOD  TROPONIN I  I-STAT CG4 LACTIC ACID, ED  POC OCCULT BLOOD, ED    Imaging Review Ct Abdomen Pelvis W Contrast  03/07/2014   CLINICAL DATA:  80 year old female presenting with  rectal pressure gradually worsening over the prior month.  EXAM: CT ABDOMEN AND PELVIS WITH CONTRAST  TECHNIQUE: Multidetector CT imaging of the abdomen and pelvis was performed using the standard protocol following bolus administration of intravenous contrast.  CONTRAST:  108mL OMNIPAQUE IOHEXOL 300 MG/ML SOLN, 62mL OMNIPAQUE IOHEXOL 300 MG/ML SOLN  COMPARISON:  CT of the abdomen and pelvis 01/17/2014.  FINDINGS: Lower chest: Cardiomegaly with biatrial dilatation. Calcifications of the aortic valve. Atherosclerotic calcifications in the left main and left circumflex coronary arteries. Small hiatal hernia.  Hepatobiliary: Multifocal low-attenuation lesions appear unchanged in size, number and distribution scattered throughout the liver. Many these are too small to definitively characterize, while the larger lesions are intermediate attenuation, favored to represent proteinaceous cysts. The largest of these lesions measures only 12 mm in segment 2 (image 21 of series 2). No new hepatic lesions are noted. No intra or extrahepatic biliary ductal dilatation. Status post cholecystectomy.  Pancreas: Unremarkable.  Spleen: Unremarkable.  Adrenals/Urinary Tract: Again noted is a 1.2 cm intermediate attenuation (23 HU) lesion in the lower pole of the left kidney, with some surrounding architectural distortion, favored to represent a previously ruptured proteinaceous cyst. No suspicious renal lesions are noted. Multifocal cortical thinning in the kidneys bilaterally, presumably mild scarring. No hydroureteronephrosis. Urinary bladder is normal in appearance. Bilateral adrenal glands are normal in appearance.  Stomach/Bowel: As with the prior examination there is extensive thickening of the gastric wall in the antral pre-pyloric region of the stomach. The pylorus is also markedly thickened (up to 15 mm in thickness). The stomach is moderately distended. No pathologic dilatation of small bowel or colon. Postoperative changes of  partial colectomy near the rectosigmoid junction are noted. A few scattered colonic diverticulae are noted, without surrounding inflammatory changes to suggest an acute diverticulitis at this time. The distal rectum and anus are grossly unremarkable in appearance.  Vascular/Lymphatic: Extensive atherosclerosis throughout the abdominal and pelvic vasculature, without evidence of aneurysm or dissection. No lymphadenopathy noted in the abdomen or pelvis.  Reproductive: Retroverted uterus.  Ovaries are atrophic.  Other: No significant volume of ascites.  No pneumoperitoneum.  Musculoskeletal: Tiny ventral hernia, through which there is incomplete herniation of the mid transverse colon; no findings to suggest associated bowel  incarceration or obstruction at this time. Old compression fracture of superior endplate of 624THL with approximately 20% loss of anterior vertebral body height (unchanged). There are no aggressive appearing lytic or blastic lesions noted in the visualized portions of the skeleton.  IMPRESSION: 1. No findings to account for the patient's worsening rectal pressure. 2. There is persistent thickening of the pylorus and antral pre-pyloric region of the stomach, which could be inflammatory in the setting of ulcer disease, or could be neoplastic. There is persistent moderate dilatation of the stomach, suggesting some degree of gastric outlet obstruction. Correlation with results of recent upper endoscopy is suggested. 3. Additional incidental findings, as above, similar to prior examination.   Electronically Signed   By: Vinnie Langton M.D.   On: 03/07/2014 16:28     EKG Interpretation None       Date: 03/07/2014  Rate: 56  Rhythm: sinus bradycardia  QRS Axis: normal  Intervals: normal  ST/T Wave abnormalities: normal  Conduction Disutrbances:none  Narrative Interpretation:   Old EKG Reviewed: unchanged    MDM   Final diagnoses:  Abdominal pain  Rectal pain   Patient presented to  the ER for evaluation of abdominal discomfort. She also reports that she has had a rectal pain and fullness and feels like she is experiencing urinary dysuria. Patient was recently hospitalized for small bowel obstruction last month. She did not have any abdominal distention and she did have bowel sounds. There is mild diffuse tenderness without any guarding or rebound. Workup has been unremarkable. I did perform a rectal exam, no impaction. Heme negative. CT scan was performed to further evaluate. No acute pathology seen. Patient had several large bowel movements here in the ER, reports having taken MiraLAX last night. Symptoms are likely secondary to the constipation which appears to now be treated. She'll be discharged, follow-up with her doctor and GI doctor.    Orpah Greek, MD 03/08/14 0730

## 2014-03-07 NOTE — ED Notes (Signed)
[name]/daughter contact number [phone].

## 2014-03-07 NOTE — ED Notes (Signed)
Pt had large soft bowel movement.

## 2014-03-07 NOTE — ED Notes (Signed)
Per reports worsening rectal pressure over past month. Pt reports small BM last night otherwise normal BM.

## 2014-03-08 LAB — URINE CULTURE: Colony Count: 75000

## 2014-03-10 ENCOUNTER — Ambulatory Visit: Payer: Self-pay | Admitting: Internal Medicine

## 2014-03-11 ENCOUNTER — Ambulatory Visit: Payer: Self-pay | Admitting: Internal Medicine

## 2014-03-11 ENCOUNTER — Ambulatory Visit: Payer: Medicare Other | Admitting: Internal Medicine

## 2014-03-12 ENCOUNTER — Emergency Department (HOSPITAL_COMMUNITY): Payer: Medicare Other

## 2014-03-12 ENCOUNTER — Other Ambulatory Visit: Payer: Self-pay

## 2014-03-12 ENCOUNTER — Encounter (HOSPITAL_COMMUNITY): Payer: Self-pay | Admitting: Emergency Medicine

## 2014-03-12 ENCOUNTER — Ambulatory Visit (INDEPENDENT_AMBULATORY_CARE_PROVIDER_SITE_OTHER): Payer: Medicare Other | Admitting: Nurse Practitioner

## 2014-03-12 ENCOUNTER — Encounter: Payer: Self-pay | Admitting: Nurse Practitioner

## 2014-03-12 ENCOUNTER — Inpatient Hospital Stay (HOSPITAL_COMMUNITY)
Admission: EM | Admit: 2014-03-12 | Discharge: 2014-03-26 | DRG: 380 | Payer: Medicare Other | Attending: Internal Medicine | Admitting: Internal Medicine

## 2014-03-12 VITALS — BP 150/88 | HR 79 | Temp 99.9°F | Resp 12 | Wt 132.0 lb

## 2014-03-12 DIAGNOSIS — K311 Adult hypertrophic pyloric stenosis: Principal | ICD-10-CM | POA: Diagnosis present

## 2014-03-12 DIAGNOSIS — R112 Nausea with vomiting, unspecified: Secondary | ICD-10-CM | POA: Diagnosis not present

## 2014-03-12 DIAGNOSIS — Z8673 Personal history of transient ischemic attack (TIA), and cerebral infarction without residual deficits: Secondary | ICD-10-CM

## 2014-03-12 DIAGNOSIS — K210 Gastro-esophageal reflux disease with esophagitis: Secondary | ICD-10-CM | POA: Diagnosis not present

## 2014-03-12 DIAGNOSIS — M19019 Primary osteoarthritis, unspecified shoulder: Secondary | ICD-10-CM | POA: Diagnosis present

## 2014-03-12 DIAGNOSIS — E119 Type 2 diabetes mellitus without complications: Secondary | ICD-10-CM | POA: Diagnosis present

## 2014-03-12 DIAGNOSIS — Z8249 Family history of ischemic heart disease and other diseases of the circulatory system: Secondary | ICD-10-CM | POA: Diagnosis not present

## 2014-03-12 DIAGNOSIS — E785 Hyperlipidemia, unspecified: Secondary | ICD-10-CM | POA: Diagnosis present

## 2014-03-12 DIAGNOSIS — R1031 Right lower quadrant pain: Secondary | ICD-10-CM | POA: Diagnosis not present

## 2014-03-12 DIAGNOSIS — M6281 Muscle weakness (generalized): Secondary | ICD-10-CM | POA: Diagnosis not present

## 2014-03-12 DIAGNOSIS — Z9049 Acquired absence of other specified parts of digestive tract: Secondary | ICD-10-CM | POA: Diagnosis present

## 2014-03-12 DIAGNOSIS — F329 Major depressive disorder, single episode, unspecified: Secondary | ICD-10-CM | POA: Diagnosis present

## 2014-03-12 DIAGNOSIS — N183 Chronic kidney disease, stage 3 unspecified: Secondary | ICD-10-CM | POA: Diagnosis present

## 2014-03-12 DIAGNOSIS — Z8711 Personal history of peptic ulcer disease: Secondary | ICD-10-CM | POA: Diagnosis not present

## 2014-03-12 DIAGNOSIS — I739 Peripheral vascular disease, unspecified: Secondary | ICD-10-CM | POA: Diagnosis present

## 2014-03-12 DIAGNOSIS — R109 Unspecified abdominal pain: Secondary | ICD-10-CM | POA: Insufficient documentation

## 2014-03-12 DIAGNOSIS — K659 Peritonitis, unspecified: Secondary | ICD-10-CM | POA: Diagnosis present

## 2014-03-12 DIAGNOSIS — G9340 Encephalopathy, unspecified: Secondary | ICD-10-CM | POA: Diagnosis not present

## 2014-03-12 DIAGNOSIS — K223 Perforation of esophagus: Secondary | ICD-10-CM

## 2014-03-12 DIAGNOSIS — Z833 Family history of diabetes mellitus: Secondary | ICD-10-CM

## 2014-03-12 DIAGNOSIS — N390 Urinary tract infection, site not specified: Secondary | ICD-10-CM | POA: Diagnosis not present

## 2014-03-12 DIAGNOSIS — Z823 Family history of stroke: Secondary | ICD-10-CM

## 2014-03-12 DIAGNOSIS — Z794 Long term (current) use of insulin: Secondary | ICD-10-CM | POA: Diagnosis not present

## 2014-03-12 DIAGNOSIS — E871 Hypo-osmolality and hyponatremia: Secondary | ICD-10-CM

## 2014-03-12 DIAGNOSIS — I10 Essential (primary) hypertension: Secondary | ICD-10-CM | POA: Diagnosis present

## 2014-03-12 DIAGNOSIS — I6782 Cerebral ischemia: Secondary | ICD-10-CM | POA: Diagnosis not present

## 2014-03-12 DIAGNOSIS — R1013 Epigastric pain: Secondary | ICD-10-CM | POA: Diagnosis not present

## 2014-03-12 DIAGNOSIS — Z0389 Encounter for observation for other suspected diseases and conditions ruled out: Secondary | ICD-10-CM | POA: Diagnosis not present

## 2014-03-12 DIAGNOSIS — Z4682 Encounter for fitting and adjustment of non-vascular catheter: Secondary | ICD-10-CM | POA: Diagnosis not present

## 2014-03-12 DIAGNOSIS — I639 Cerebral infarction, unspecified: Secondary | ICD-10-CM | POA: Diagnosis not present

## 2014-03-12 DIAGNOSIS — K432 Incisional hernia without obstruction or gangrene: Secondary | ICD-10-CM | POA: Diagnosis present

## 2014-03-12 DIAGNOSIS — E860 Dehydration: Secondary | ICD-10-CM | POA: Diagnosis present

## 2014-03-12 DIAGNOSIS — K3189 Other diseases of stomach and duodenum: Secondary | ICD-10-CM | POA: Diagnosis not present

## 2014-03-12 DIAGNOSIS — K3184 Gastroparesis: Secondary | ICD-10-CM | POA: Insufficient documentation

## 2014-03-12 DIAGNOSIS — G629 Polyneuropathy, unspecified: Secondary | ICD-10-CM | POA: Diagnosis present

## 2014-03-12 DIAGNOSIS — I4891 Unspecified atrial fibrillation: Secondary | ICD-10-CM | POA: Diagnosis present

## 2014-03-12 DIAGNOSIS — N189 Chronic kidney disease, unspecified: Secondary | ICD-10-CM | POA: Diagnosis not present

## 2014-03-12 DIAGNOSIS — I129 Hypertensive chronic kidney disease with stage 1 through stage 4 chronic kidney disease, or unspecified chronic kidney disease: Secondary | ICD-10-CM | POA: Diagnosis present

## 2014-03-12 DIAGNOSIS — Q400 Congenital hypertrophic pyloric stenosis: Secondary | ICD-10-CM | POA: Diagnosis not present

## 2014-03-12 DIAGNOSIS — K219 Gastro-esophageal reflux disease without esophagitis: Secondary | ICD-10-CM | POA: Diagnosis present

## 2014-03-12 DIAGNOSIS — K449 Diaphragmatic hernia without obstruction or gangrene: Secondary | ICD-10-CM | POA: Diagnosis present

## 2014-03-12 DIAGNOSIS — N854 Malposition of uterus: Secondary | ICD-10-CM | POA: Diagnosis present

## 2014-03-12 DIAGNOSIS — I700 Atherosclerosis of aorta: Secondary | ICD-10-CM | POA: Diagnosis present

## 2014-03-12 DIAGNOSIS — R4781 Slurred speech: Secondary | ICD-10-CM | POA: Diagnosis not present

## 2014-03-12 DIAGNOSIS — E876 Hypokalemia: Secondary | ICD-10-CM | POA: Diagnosis present

## 2014-03-12 DIAGNOSIS — Z7982 Long term (current) use of aspirin: Secondary | ICD-10-CM | POA: Diagnosis not present

## 2014-03-12 DIAGNOSIS — K5900 Constipation, unspecified: Secondary | ICD-10-CM | POA: Diagnosis present

## 2014-03-12 DIAGNOSIS — E039 Hypothyroidism, unspecified: Secondary | ICD-10-CM | POA: Diagnosis not present

## 2014-03-12 DIAGNOSIS — I517 Cardiomegaly: Secondary | ICD-10-CM | POA: Diagnosis not present

## 2014-03-12 DIAGNOSIS — R0789 Other chest pain: Secondary | ICD-10-CM | POA: Diagnosis not present

## 2014-03-12 DIAGNOSIS — Z0189 Encounter for other specified special examinations: Secondary | ICD-10-CM

## 2014-03-12 DIAGNOSIS — I2510 Atherosclerotic heart disease of native coronary artery without angina pectoris: Secondary | ICD-10-CM | POA: Diagnosis present

## 2014-03-12 DIAGNOSIS — E038 Other specified hypothyroidism: Secondary | ICD-10-CM | POA: Diagnosis not present

## 2014-03-12 DIAGNOSIS — R2681 Unsteadiness on feet: Secondary | ICD-10-CM | POA: Diagnosis not present

## 2014-03-12 DIAGNOSIS — R140 Abdominal distension (gaseous): Secondary | ICD-10-CM

## 2014-03-12 DIAGNOSIS — T182XXA Foreign body in stomach, initial encounter: Secondary | ICD-10-CM | POA: Diagnosis present

## 2014-03-12 DIAGNOSIS — H409 Unspecified glaucoma: Secondary | ICD-10-CM | POA: Diagnosis not present

## 2014-03-12 DIAGNOSIS — R079 Chest pain, unspecified: Secondary | ICD-10-CM | POA: Diagnosis not present

## 2014-03-12 DIAGNOSIS — K5660 Unspecified intestinal obstruction: Secondary | ICD-10-CM | POA: Diagnosis not present

## 2014-03-12 DIAGNOSIS — R1110 Vomiting, unspecified: Secondary | ICD-10-CM | POA: Diagnosis not present

## 2014-03-12 DIAGNOSIS — K224 Dyskinesia of esophagus: Secondary | ICD-10-CM | POA: Diagnosis not present

## 2014-03-12 DIAGNOSIS — H04123 Dry eye syndrome of bilateral lacrimal glands: Secondary | ICD-10-CM | POA: Diagnosis not present

## 2014-03-12 DIAGNOSIS — R072 Precordial pain: Secondary | ICD-10-CM | POA: Diagnosis not present

## 2014-03-12 DIAGNOSIS — R1084 Generalized abdominal pain: Secondary | ICD-10-CM | POA: Diagnosis not present

## 2014-03-12 DIAGNOSIS — K5790 Diverticulosis of intestine, part unspecified, without perforation or abscess without bleeding: Secondary | ICD-10-CM | POA: Diagnosis not present

## 2014-03-12 DIAGNOSIS — T182XXD Foreign body in stomach, subsequent encounter: Secondary | ICD-10-CM | POA: Diagnosis not present

## 2014-03-12 DIAGNOSIS — K5730 Diverticulosis of large intestine without perforation or abscess without bleeding: Secondary | ICD-10-CM | POA: Diagnosis present

## 2014-03-12 DIAGNOSIS — G319 Degenerative disease of nervous system, unspecified: Secondary | ICD-10-CM | POA: Diagnosis present

## 2014-03-12 DIAGNOSIS — R278 Other lack of coordination: Secondary | ICD-10-CM | POA: Diagnosis not present

## 2014-03-12 LAB — CBC WITH DIFFERENTIAL/PLATELET
Basophils Absolute: 0.1 10*3/uL (ref 0.0–0.1)
Basophils Relative: 1 % (ref 0–1)
Eosinophils Absolute: 0.1 10*3/uL (ref 0.0–0.7)
Eosinophils Relative: 2 % (ref 0–5)
HCT: 34.7 % — ABNORMAL LOW (ref 36.0–46.0)
Hemoglobin: 12 g/dL (ref 12.0–15.0)
Lymphocytes Relative: 16 % (ref 12–46)
Lymphs Abs: 0.9 10*3/uL (ref 0.7–4.0)
MCH: 30.8 pg (ref 26.0–34.0)
MCHC: 34.6 g/dL (ref 30.0–36.0)
MCV: 89 fL (ref 78.0–100.0)
Monocytes Absolute: 0.8 10*3/uL (ref 0.1–1.0)
Monocytes Relative: 15 % — ABNORMAL HIGH (ref 3–12)
Neutro Abs: 3.7 10*3/uL (ref 1.7–7.7)
Neutrophils Relative %: 66 % (ref 43–77)
Platelets: 289 10*3/uL (ref 150–400)
RBC: 3.9 MIL/uL (ref 3.87–5.11)
RDW: 15.5 % (ref 11.5–15.5)
WBC: 5.5 10*3/uL (ref 4.0–10.5)

## 2014-03-12 LAB — COMPREHENSIVE METABOLIC PANEL
ALT: 16 U/L (ref 0–35)
AST: 26 U/L (ref 0–37)
Albumin: 4.4 g/dL (ref 3.5–5.2)
Alkaline Phosphatase: 77 U/L (ref 39–117)
Anion gap: 18 — ABNORMAL HIGH (ref 5–15)
BUN: 18 mg/dL (ref 6–23)
CO2: 24 mEq/L (ref 19–32)
Calcium: 11.1 mg/dL — ABNORMAL HIGH (ref 8.4–10.5)
Chloride: 93 mEq/L — ABNORMAL LOW (ref 96–112)
Creatinine, Ser: 1.26 mg/dL — ABNORMAL HIGH (ref 0.50–1.10)
GFR calc Af Amer: 43 mL/min — ABNORMAL LOW (ref >90)
GFR calc non Af Amer: 37 mL/min — ABNORMAL LOW (ref >90)
Glucose, Bld: 101 mg/dL — ABNORMAL HIGH (ref 70–99)
Potassium: 4.3 mEq/L (ref 3.7–5.3)
Sodium: 135 mEq/L — ABNORMAL LOW (ref 137–147)
Total Bilirubin: 0.7 mg/dL (ref 0.3–1.2)
Total Protein: 8.3 g/dL (ref 6.0–8.3)

## 2014-03-12 LAB — TROPONIN I: Troponin I: <0.3 ng/mL (ref <0.30)

## 2014-03-12 LAB — URINALYSIS, ROUTINE W REFLEX MICROSCOPIC
Bilirubin Urine: NEGATIVE
Glucose, UA: NEGATIVE mg/dL
Hgb urine dipstick: NEGATIVE
Ketones, ur: NEGATIVE mg/dL
Leukocytes, UA: NEGATIVE
Nitrite: NEGATIVE
Protein, ur: >300 mg/dL — AB
Specific Gravity, Urine: 1.017 (ref 1.005–1.030)
Urobilinogen, UA: 0.2 mg/dL (ref 0.0–1.0)
pH: 8.5 — ABNORMAL HIGH (ref 5.0–8.0)

## 2014-03-12 LAB — URINE MICROSCOPIC-ADD ON

## 2014-03-12 LAB — I-STAT CG4 LACTIC ACID, ED: Lactic Acid, Venous: 1.49 mmol/L (ref 0.5–2.2)

## 2014-03-12 LAB — LIPASE, BLOOD: Lipase: 49 U/L (ref 11–59)

## 2014-03-12 MED ORDER — PANTOPRAZOLE SODIUM 40 MG IV SOLR
40.0000 mg | INTRAVENOUS | Status: AC
  Administered 2014-03-13: 40 mg via INTRAVENOUS
  Filled 2014-03-12: qty 40

## 2014-03-12 MED ORDER — IOHEXOL 300 MG/ML  SOLN: 100.0000 mL | Freq: Once | INTRAMUSCULAR | Status: AC | PRN

## 2014-03-12 MED ORDER — SODIUM CHLORIDE 0.9 % IV BOLUS (SEPSIS)
1000.0000 mL | Freq: Once | INTRAVENOUS | Status: AC
  Administered 2014-03-12: 1000 mL via INTRAVENOUS

## 2014-03-12 MED ORDER — ONDANSETRON HCL 4 MG/2ML IJ SOLN
4.0000 mg | Freq: Once | INTRAMUSCULAR | Status: AC
  Administered 2014-03-12: 4 mg via INTRAVENOUS
  Filled 2014-03-12: qty 2

## 2014-03-12 MED ORDER — MORPHINE SULFATE 4 MG/ML IJ SOLN
4.0000 mg | Freq: Once | INTRAMUSCULAR | Status: DC
Start: 2014-03-12 — End: 2014-03-13
  Filled 2014-03-12: qty 1

## 2014-03-12 MED ORDER — SODIUM CHLORIDE 0.9 % IV SOLN
INTRAVENOUS | Status: AC
  Administered 2014-03-13: via INTRAVENOUS

## 2014-03-12 MED ORDER — HYDRALAZINE HCL 20 MG/ML IJ SOLN
10.0000 mg | INTRAMUSCULAR | Status: AC
  Administered 2014-03-13: 10 mg via INTRAVENOUS
  Filled 2014-03-12: qty 1

## 2014-03-12 NOTE — Progress Notes (Signed)
Patient ID: Shannon [name], female   DOB: [birth year], 80 y.o.   MRN: [id number]    PCP: Oneal Grout, MD  No Known Allergies  Chief Complaint  Patient presents with  . Acute Visit    B/P elevated (150/88 Left, 170/102 Right) and throwing up blood x1, 4 other vomitting episodes (no blood)      HPI: Patient is a 80 y.o. female seen in the office today to follow up blood pressure. Cozaar and sodium tablets stopped last week.  Has had weight loss.  Went to the ED 6 days ago due to increased pressure in her rectum, had BM in the hospital and then pressure improved. Has not had BM since.  Having vomiting today - this morning daughter noticed small amout of blood in her emesis. Has had several other episodes of emesis today. Was able to hold down some liquids today.  Pt reports she feels bad, very dizzy and weak.  Review of Systems:  Review of Systems  Constitutional: Positive for fever, fatigue and unexpected weight change. Negative for activity change and appetite change.  HENT: Negative for congestion and hearing loss.   Eyes: Negative.   Respiratory: Negative for cough and shortness of breath.   Cardiovascular: Negative for chest pain, palpitations and leg swelling.  Gastrointestinal: Positive for abdominal pain, constipation and abdominal distention. Negative for diarrhea.  Genitourinary: Negative for dysuria and difficulty urinating.  Musculoskeletal: Negative for myalgias and arthralgias.  Skin: Negative for color change and wound.  Neurological: Negative for dizziness and weakness.  Psychiatric/Behavioral: Positive for confusion. Negative for behavioral problems and agitation.    Past Medical History  Diagnosis Date  . Pneumonia   . Thyroid disease   . Seizures   . Stroke   . Hyperlipidemia   . Hypertension   . Diabetes mellitus without complication   . Small bowel obstruction   . Ventral hernia with bowel obstruction    Past Surgical History  Procedure Laterality Date    . Exploratory laparotomy  09-07-2013  . Cholecystectomy    . Tubal ligation    . Colectomy    . Hernia repair  2015  . Esophagogastroduodenoscopy N/A 01/19/2014    Procedure: ESOPHAGOGASTRODUODENOSCOPY (EGD);  Surgeon: Hilarie Fredrickson, MD;  Location: [facility];  Service: Endoscopy;  Laterality: N/A;  . Esophagogastroduodenoscopy N/A 01/20/2014    Procedure: ESOPHAGOGASTRODUODENOSCOPY (EGD);  Surgeon: Hilarie Fredrickson, MD;  Location: [facility];  Service: Endoscopy;  Laterality: N/A;   Social History:   reports that she has never smoked. She does not have any smokeless tobacco history on file. She reports that she does not drink alcohol or use illicit drugs.  Family History  Problem Relation Age of Onset  . Stroke Mother   . Heart attack Neg Hx   . Hypertension Mother   . Diabetes Brother     Medications: Patient's Medications  New Prescriptions   No medications on file  Previous Medications   ACETAMINOPHEN (TYLENOL EX ST ARTHRITIS PAIN) 500 MG TABLET    Take 500 mg by mouth every 6 (six) hours as needed (for arthritis pain).   ASPIRIN EC 81 MG TABLET    Take 81 mg by mouth every morning.   ATORVASTATIN (LIPITOR) 10 MG TABLET    Take 10 mg by mouth daily at 6 PM.   B COMPLEX-C (B-COMPLEX WITH VITAMIN C) TABLET    Take 1 tablet by mouth 2 (two) times daily.   CALCIUM CARBONATE (CALTRATE 600 PO)  Take 1 tablet by mouth 2 (two) times daily.   CALCIUM-VITAMIN D (OSCAL WITH D) 500-200 MG-UNIT PER TABLET    Take 1 tablet by mouth 2 (two) times daily.   CYCLOSPORINE (RESTASIS) 0.05 % OPHTHALMIC EMULSION    Place 1 drop into both eyes 2 (two) times daily.   DIETARY MANAGEMENT PRODUCT (VASCULERA) TABS    Take 1 tablet by mouth daily.   FLUTICASONE (FLONASE) 50 MCG/ACT NASAL SPRAY    Place 2 sprays into both nostrils daily.   GABAPENTIN (NEURONTIN) 300 MG CAPSULE    Take 300 mg by mouth 3 (three) times daily.   HYDRALAZINE (APRESOLINE) 100 MG TABLET    Take 100 mg by mouth 3 (three) times  daily.   ISOSORBIDE MONONITRATE (IMDUR) 60 MG 24 HR TABLET    Take 60 mg by mouth every morning.   L-METHYLFOLATE-B6-B12 (METANX) 3-35-2 MG TABS    Take 1 tablet by mouth every morning.   LEVOTHYROXINE (SYNTHROID, LEVOTHROID) 175 MCG TABLET    Take 175 mcg by mouth daily before breakfast.   METOPROLOL SUCCINATE (TOPROL-XL) 50 MG 24 HR TABLET    Take 50 mg by mouth every morning. Take with or immediately following a meal.   MULTIPLE VITAMINS-MINERALS (MULTIVITAMIN WITH MINERALS) TABLET    Take 1 tablet by mouth every morning.    PANTOPRAZOLE (PROTONIX) 40 MG TABLET    Take 40 mg by mouth 2 (two) times daily before a meal.   POLYETHYLENE GLYCOL (MIRALAX / GLYCOLAX) PACKET    Take 17 g by mouth daily as needed for mild constipation.   SENNOSIDES-DOCUSATE SODIUM (SENOKOT-S) 8.6-50 MG TABLET    Take 1 tablet by mouth 2 (two) times daily.    SERTRALINE (ZOLOFT) 100 MG TABLET    Take 100 mg by mouth every morning.    SUCRALFATE (CARAFATE) 1 G TABLET    Take 1 g by mouth 3 (three) times daily with meals.   TRAMADOL (ULTRAM) 50 MG TABLET    Take 50 mg by mouth 2 (two) times daily as needed.  Modified Medications   No medications on file  Discontinued Medications   No medications on file     Physical Exam:  Filed Vitals:   03/12/14 1612  BP: 150/88  Pulse: 79  Temp: 99.9 F (37.7 C)  TempSrc: Oral  Resp: 12  Weight: 132 lb (59.875 kg)  SpO2: 98%    Physical Exam  Constitutional: She appears ill. No distress.  Frail female    HENT:  Head: Normocephalic and atraumatic.  Mouth/Throat: Oropharynx is clear and moist. No oropharyngeal exudate.  Eyes: Conjunctivae are normal. Pupils are equal, round, and reactive to light.  Neck: Normal range of motion. Neck supple.  Cardiovascular: Normal rate, regular rhythm and normal heart sounds.   Pulmonary/Chest: Effort normal and breath sounds normal.  Abdominal: She exhibits distension. Bowel sounds are decreased. There is generalized tenderness.    Musculoskeletal: She exhibits no edema or tenderness.  Neurological: She is alert.  Skin: Skin is warm and dry. She is not diaphoretic.  Psychiatric: She has a normal mood and affect.    Labs reviewed: Basic Metabolic Panel:  Recent Labs  52/84/13  02/17/14 1018 03/05/14 1654 03/07/14 1138  NA 127*  < > 127* 132* 129*  K 4.3  < > 5.0 4.2 4.6  CL  --   < > 89* 95* 92*  CO2  --   < > 22 24 22   GLUCOSE  --   < >  124* 103* 128*  BUN 19  < > 27 22 22   CREATININE 1.0  < > 1.29* 1.13* 1.12*  CALCIUM  --   < > 9.8 10.0 10.5  MG 1.5*  --   --   --   --   < > = values in this interval not displayed. Liver Function Tests:  Recent Labs  12/23/13 2019 01/17/14 2120 02/03/14 1113 03/07/14 1138  AST 22 33 28 28  ALT 16 16 17 20   ALKPHOS 79 73 64 69  BILITOT 0.3 0.4 0.4 0.6  PROT 7.6 8.0 6.6 7.8  ALBUMIN 4.0 3.9  --  4.0    Recent Labs  01/17/14 2120 03/07/14 1138  LIPASE 91* 41   No results for input(s): AMMONIA in the last 8760 hours. CBC:  Recent Labs  01/17/14 2120 01/20/14 0510 01/22/14 0520 02/03/14 1113 03/07/14 1138  WBC 6.9 7.6 9.9 3.5 5.0  NEUTROABS 3.8  --   --  1.8 3.3  HGB 11.1* 10.6* 9.2* 10.0* 10.3*  HCT 32.9* 31.1* 26.7* 28.2* 30.3*  MCV 90.1 90.1 88.7 87 88.9  PLT 227 235 206  --  276   Lipid Panel:  Recent Labs  12/24/13 0456  CHOL 110  HDL 58  LDLCALC 26  TRIG 130  CHOLHDL 1.9   TSH: No results for input(s): TSH in the last 8760 hours. A1C: Lab Results  Component Value Date   HGBA1C 6.9* 12/24/2013     Assessment/Plan  1. Abdominal distension Pt without BM for several days, now with nausea and vomiting. Dark colored emesis in office that smelled like stool.  Pt with low grade temp of 99.9 (pts baseline ~97) and abdominal pain with distension leads to concerns of obstruction. Will send to the ED for further evaluation.    2. Hypertension Improved today, Sodium will need to be rechecked (which will be done in ED) since she  has been taken off her sodium tablets and cozaar.    3. Low sodium level Will need follow up in ED

## 2014-03-12 NOTE — ED Notes (Signed)
Per pt's family, pt has had emesis constant emesis today, has been constipated since Saturday when she was brought to ED for constipation, c/o RLQ abdominal pain and rectal pain.  Since then pt has only had small, insignificant BMs. Pt's family administered Miralax today.

## 2014-03-12 NOTE — ED Provider Notes (Signed)
CSN: [id number]     Arrival date & time 03/12/14  1718 History   First MD Initiated Contact with Patient 03/12/14 1921     No chief complaint on file.    (Consider location/radiation/quality/duration/timing/severity/associated sxs/prior Treatment) HPI Comments: Patient presents to the ER for evaluation of continued abdominal pain since 12/6 when she was seen in the ER. Patient reports that she has been having pain and pressure in the low abdomen. She has had nausea, and multiple episodes of non-bloody non-bilious emesis. She has not been having normal bowel movements, feels like she is going less frequently, last BM was yesterday, but small and hard.   Patient's Gastroenterologist is Dr. Henrene Pastor.    Past Medical History  Diagnosis Date  . Pneumonia   . Thyroid disease   . Seizures   . Stroke   . Hyperlipidemia   . Hypertension   . Diabetes mellitus without complication   . Small bowel obstruction   . Ventral hernia with bowel obstruction    Past Surgical History  Procedure Laterality Date  . Exploratory laparotomy  09-07-2013  . Cholecystectomy    . Tubal ligation    . Colectomy    . Hernia repair  2015  . Esophagogastroduodenoscopy N/A 01/19/2014    Procedure: ESOPHAGOGASTRODUODENOSCOPY (EGD);  Surgeon: Irene Shipper, MD;  Location: [facility];  Service: Endoscopy;  Laterality: N/A;  . Esophagogastroduodenoscopy N/A 01/20/2014    Procedure: ESOPHAGOGASTRODUODENOSCOPY (EGD);  Surgeon: Irene Shipper, MD;  Location: [facility];  Service: Endoscopy;  Laterality: N/A;   Family History  Problem Relation Age of Onset  . Stroke Mother   . Heart attack Neg Hx   . Hypertension Mother   . Diabetes Brother    History  Substance Use Topics  . Smoking status: Never Smoker   . Smokeless tobacco: Not on file  . Alcohol Use: No   OB History    No data available     Review of Systems  Gastrointestinal: Positive for nausea, vomiting, abdominal pain and constipation.  All other  systems reviewed and are negative.     Allergies  Review of patient's allergies indicates no known allergies.  Home Medications   Prior to Admission medications   Medication Sig Start Date End Date Taking? Authorizing Provider  acetaminophen (TYLENOL EX ST ARTHRITIS PAIN) 500 MG tablet Take 500 mg by mouth every 6 (six) hours as needed (for arthritis pain).   Yes Historical Provider, MD  aspirin EC 81 MG tablet Take 81 mg by mouth every morning.   Yes Historical Provider, MD  atorvastatin (LIPITOR) 10 MG tablet Take 10 mg by mouth daily at 6 PM.   Yes Historical Provider, MD  B Complex-C (B-COMPLEX WITH VITAMIN C) tablet Take 1 tablet by mouth 2 (two) times daily.   Yes Historical Provider, MD  calcium-vitamin D (OSCAL WITH D) 500-200 MG-UNIT per tablet Take 1 tablet by mouth 2 (two) times daily.   Yes Historical Provider, MD  cycloSPORINE (RESTASIS) 0.05 % ophthalmic emulsion Place 1 drop into both eyes 2 (two) times daily.   Yes Historical Provider, MD  Dietary Management Product (VASCULERA) TABS Take 1 tablet by mouth daily.   Yes Historical Provider, MD  fluticasone (FLONASE) 50 MCG/ACT nasal spray Place 2 sprays into both nostrils daily.   Yes Historical Provider, MD  gabapentin (NEURONTIN) 300 MG capsule Take 300 mg by mouth 3 (three) times daily.   Yes Historical Provider, MD  hydrALAZINE (APRESOLINE) 100 MG tablet Take 100 mg  by mouth 3 (three) times daily.   Yes Historical Provider, MD  isosorbide mononitrate (IMDUR) 60 MG 24 hr tablet Take 60 mg by mouth every morning.   Yes Historical Provider, MD  l-methylfolate-B6-B12 (METANX) 3-35-2 MG TABS Take 1 tablet by mouth every morning.   Yes Historical Provider, MD  levothyroxine (SYNTHROID, LEVOTHROID) 175 MCG tablet Take 175 mcg by mouth daily before breakfast.   Yes Historical Provider, MD  metoprolol succinate (TOPROL-XL) 50 MG 24 hr tablet Take 50 mg by mouth every morning. Take with or immediately following a meal.   Yes  Historical Provider, MD  Multiple Vitamins-Minerals (MULTIVITAMIN WITH MINERALS) tablet Take 1 tablet by mouth every morning.    Yes Historical Provider, MD  pantoprazole (PROTONIX) 40 MG tablet Take 40 mg by mouth 2 (two) times daily before a meal. 02/03/14  Yes Historical Provider, MD  polyethylene glycol (MIRALAX / GLYCOLAX) packet Take 17 g by mouth daily as needed for mild constipation.   Yes Historical Provider, MD  sennosides-docusate sodium (SENOKOT-S) 8.6-50 MG tablet Take 1 tablet by mouth 2 (two) times daily.    Yes Historical Provider, MD  sertraline (ZOLOFT) 100 MG tablet Take 100 mg by mouth every morning.    Yes Historical Provider, MD  sucralfate (CARAFATE) 1 G tablet Take 1 g by mouth 3 (three) times daily with meals.   Yes Historical Provider, MD  Calcium Carbonate (CALTRATE 600 PO) Take 600 mg by mouth 2 (two) times daily.     Historical Provider, MD  traMADol (ULTRAM) 50 MG tablet Take 50 mg by mouth 2 (two) times daily as needed for moderate pain.     Historical Provider, MD   BP 206/81 mmHg  Pulse 66  Temp(Src) 98.6 F (37 C) (Oral)  Resp 18  SpO2 97% Physical Exam  Constitutional: She is oriented to person, place, and time. She appears well-developed and well-nourished. No distress.  HENT:  Head: Normocephalic and atraumatic.  Right Ear: External ear normal.  Left Ear: External ear normal.  Nose: Nose normal.  Mouth/Throat: Oropharynx is clear and moist. No oropharyngeal exudate.  Eyes: Conjunctivae are normal.  Neck: Normal range of motion. Neck supple.  Cardiovascular: Normal rate, regular rhythm and normal heart sounds.   Pulmonary/Chest: Effort normal and breath sounds normal.  Abdominal: Soft. Bowel sounds are normal. She exhibits no distension. There is no tenderness. There is no rebound and no guarding.  Musculoskeletal: Normal range of motion.  Lymphadenopathy:    She has no cervical adenopathy.  Neurological: She is alert and oriented to person, place,  and time.  Skin: Skin is warm and dry. She is not diaphoretic.  Psychiatric: She has a normal mood and affect.  Nursing note and vitals reviewed.   ED Course  Procedures (including critical care time) Medications  morphine 4 MG/ML injection 4 mg (4 mg Intravenous Not Given 03/12/14 2105)  iohexol (OMNIPAQUE) 300 MG/ML solution 100 mL (100 mLs Intravenous Canceled Entry 03/12/14 2210)  hydrALAZINE (APRESOLINE) injection 10 mg (not administered)  pantoprazole (PROTONIX) injection 40 mg (not administered)  0.9 %  sodium chloride infusion (not administered)  sodium chloride 0.9 % bolus 1,000 mL (1,000 mLs Intravenous New Bag/Given 03/12/14 2015)  ondansetron (ZOFRAN) injection 4 mg (4 mg Intravenous Given 03/12/14 2015)    Labs Review Labs Reviewed  CBC WITH DIFFERENTIAL - Abnormal; Notable for the following:    HCT 34.7 (*)    Monocytes Relative 15 (*)    All other components within normal  limits  COMPREHENSIVE METABOLIC PANEL - Abnormal; Notable for the following:    Sodium 135 (*)    Chloride 93 (*)    Glucose, Bld 101 (*)    Creatinine, Ser 1.26 (*)    Calcium 11.1 (*)    GFR calc non Af Amer 37 (*)    GFR calc Af Amer 43 (*)    Anion gap 18 (*)    All other components within normal limits  URINALYSIS, ROUTINE W REFLEX MICROSCOPIC - Abnormal; Notable for the following:    pH 8.5 (*)    Protein, ur >300 (*)    All other components within normal limits  URINE MICROSCOPIC-ADD ON - Abnormal; Notable for the following:    Casts HYALINE CASTS (*)    All other components within normal limits  URINE CULTURE  LIPASE, BLOOD  TROPONIN I  I-STAT CG4 LACTIC ACID, ED    Imaging Review Ct Abdomen Pelvis Wo Contrast  03/12/2014   CLINICAL DATA:  Constant vomiting today, constipated since Saturday, RIGHT lower quadrant rectal pain, history diabetes, hypertension, prior gastric bezoar  EXAM: CT ABDOMEN AND PELVIS WITHOUT CONTRAST  TECHNIQUE: Multidetector CT imaging of the abdomen and  pelvis was performed following the standard protocol without IV contrast. Sagittal and coronal MPR images reconstructed from axial data set.  COMPARISON:  03/07/2014  FINDINGS: Minimal bibasilar atelectasis.  Low-attenuation foci within liver, question cysts, unchanged.  Metallic foreign body at LEFT ventricle again identified, uncertain etiology.  Gallbladder surgically absent.  Probable tiny LEFT renal cyst 9 mm diameter.  Within limits of a nonenhanced exam, remainder of liver, spleen, pancreas, kidneys and adrenal glands normal appearance.  Persistent marked thickening of the pyloric channel and pre pyloric antrum gastric distention question partial gastric outlet obstruction.  Small amount of ingested contrast is present within proximal small bowel loops.  Large filling defect within the stomach, 12.2 x 10.0 x 15.2 cm, containing bubbly gas, consistent with recurrent bezoar.  Scattered colonic diverticula.  No evidence of bowel obstruction or dilatation.  Extensive atherosclerotic calcifications aorta and minimally in coronary arteries.  High attenuation nodule 13 mm diameter again identified adjacent to the thickened gastroesophageal junction, unchanged but since 03/07/2014 but new since 01/17/2014, question contrast within epiphrenic diverticulum or small focal contained perforation, unlikely calcified lymph node since this is new in past 2 months.  Unremarkable bladder, ureters, uterus and adnexae.  Appendix not visualized.  No definite rectal wall thickening.  No additional mass, adenopathy, free fluid, free air or abscess.  Bones demineralized with scattered degenerative changes.  Surgical scar at ventral abdomen with a small incisional hernia containing a portion of transverse colon.  IMPRESSION: Thickened pylorus and pre-pyloric antrum likely causing partial outlet obstruction.  Large filling defect within stomach consistent with recurrent bezoar 12.2 x 10.0 x 15.2 cm.  Colonic diverticulosis.  Suspected  hepatic cysts unchanged.  13 mm high attenuation nodule adjacent to thickened gastroesophageal junction, question contained perforation versus epiphrenic diverticulum containing contrast, new since 01/17/2014 but unchanged since 03/07/2014.  Small incisional hernia containing a portion of the transverse colon wall without obstruction.   Electronically Signed   By: Lavonia Dana M.D.   On: 03/12/2014 23:12     EKG Interpretation None         MDM   Final diagnoses:  Abdominal pain  Partial gastric outlet obstruction    Filed Vitals:   03/12/14 2202  BP: 206/81  Pulse: 66  Temp:   Resp: 18   Afebrile,  NAD, non-toxic appearing, AAOx4.   I have reviewed nursing notes, vital signs, and all appropriate lab and imaging results for this patient.  Patient with partial gastric outlet obstruction secondary to bezoar. Will place NG tube. Will admit patient to medicine with likely need for gastroenterology follow-up in the morning. Will keep patient NPO, continue to hydrate with IVF and give IV Hydralazine for hypertension secondary to intolerance of PO medications from N/V. Patient d/w with Dr. Colin Rhein, agrees with plan.      Harlow Mares, PA-C 03/13/14 0013  Debby Freiberg, MD 03/16/14 [id number]  Debby Freiberg, MD 03/16/14 [phone]

## 2014-03-13 ENCOUNTER — Encounter (HOSPITAL_COMMUNITY): Payer: Self-pay | Admitting: Internal Medicine

## 2014-03-13 ENCOUNTER — Inpatient Hospital Stay (HOSPITAL_COMMUNITY): Payer: Medicare Other

## 2014-03-13 DIAGNOSIS — K3184 Gastroparesis: Secondary | ICD-10-CM

## 2014-03-13 DIAGNOSIS — E039 Hypothyroidism, unspecified: Secondary | ICD-10-CM

## 2014-03-13 DIAGNOSIS — T182XXD Foreign body in stomach, subsequent encounter: Secondary | ICD-10-CM

## 2014-03-13 DIAGNOSIS — R109 Unspecified abdominal pain: Secondary | ICD-10-CM | POA: Insufficient documentation

## 2014-03-13 DIAGNOSIS — K311 Adult hypertrophic pyloric stenosis: Principal | ICD-10-CM

## 2014-03-13 DIAGNOSIS — I10 Essential (primary) hypertension: Secondary | ICD-10-CM

## 2014-03-13 DIAGNOSIS — R1084 Generalized abdominal pain: Secondary | ICD-10-CM

## 2014-03-13 DIAGNOSIS — N183 Chronic kidney disease, stage 3 (moderate): Secondary | ICD-10-CM

## 2014-03-13 LAB — GLUCOSE, CAPILLARY
Glucose-Capillary: 94 mg/dL (ref 70–99)
Glucose-Capillary: 94 mg/dL (ref 70–99)
Glucose-Capillary: 114 mg/dL — ABNORMAL HIGH (ref 70–99)
Glucose-Capillary: 105 mg/dL — ABNORMAL HIGH (ref 70–99)
Glucose-Capillary: 85 mg/dL (ref 70–99)

## 2014-03-13 LAB — CBC WITH DIFFERENTIAL/PLATELET
Basophils Absolute: 0.1 10*3/uL (ref 0.0–0.1)
Basophils Relative: 1 % (ref 0–1)
Eosinophils Absolute: 0.2 10*3/uL (ref 0.0–0.7)
Eosinophils Relative: 4 % (ref 0–5)
HCT: 28.8 % — ABNORMAL LOW (ref 36.0–46.0)
Hemoglobin: 10.1 g/dL — ABNORMAL LOW (ref 12.0–15.0)
Lymphocytes Relative: 12 % (ref 12–46)
Lymphs Abs: 0.7 10*3/uL (ref 0.7–4.0)
MCH: 30.7 pg (ref 26.0–34.0)
MCHC: 35.1 g/dL (ref 30.0–36.0)
MCV: 87.5 fL (ref 78.0–100.0)
Monocytes Absolute: 1.1 10*3/uL — ABNORMAL HIGH (ref 0.1–1.0)
Monocytes Relative: 18 % — ABNORMAL HIGH (ref 3–12)
Neutro Abs: 4.1 10*3/uL (ref 1.7–7.7)
Neutrophils Relative %: 65 % (ref 43–77)
Platelets: 240 10*3/uL (ref 150–400)
RBC: 3.29 MIL/uL — ABNORMAL LOW (ref 3.87–5.11)
RDW: 15.6 % — ABNORMAL HIGH (ref 11.5–15.5)
WBC: 6.2 10*3/uL (ref 4.0–10.5)

## 2014-03-13 LAB — COMPREHENSIVE METABOLIC PANEL
ALT: 13 U/L (ref 0–35)
AST: 21 U/L (ref 0–37)
Albumin: 3.4 g/dL — ABNORMAL LOW (ref 3.5–5.2)
Alkaline Phosphatase: 60 U/L (ref 39–117)
Anion gap: 12 (ref 5–15)
BUN: 16 mg/dL (ref 6–23)
CO2: 22 mEq/L (ref 19–32)
Calcium: 9.4 mg/dL (ref 8.4–10.5)
Chloride: 98 mEq/L (ref 96–112)
Creatinine, Ser: 1.21 mg/dL — ABNORMAL HIGH (ref 0.50–1.10)
GFR calc Af Amer: 45 mL/min — ABNORMAL LOW (ref >90)
GFR calc non Af Amer: 39 mL/min — ABNORMAL LOW (ref >90)
Glucose, Bld: 105 mg/dL — ABNORMAL HIGH (ref 70–99)
Potassium: 4.3 mEq/L (ref 3.7–5.3)
Sodium: 132 mEq/L — ABNORMAL LOW (ref 137–147)
Total Bilirubin: 0.6 mg/dL (ref 0.3–1.2)
Total Protein: 6.7 g/dL (ref 6.0–8.3)

## 2014-03-13 LAB — URINE CULTURE: Colony Count: 50000

## 2014-03-13 MED ORDER — ACETAMINOPHEN 650 MG RE SUPP: 650.0000 mg | Freq: Four times a day (QID) | RECTAL | Status: DC | PRN

## 2014-03-13 MED ORDER — MORPHINE SULFATE 2 MG/ML IJ SOLN
1.0000 mg | INTRAMUSCULAR | Status: DC | PRN
  Administered 2014-03-13 – 2014-03-14 (×2): 1 mg via INTRAVENOUS
  Filled 2014-03-13 (×2): qty 1

## 2014-03-13 MED ORDER — LEVOTHYROXINE SODIUM 100 MCG IV SOLR
87.5000 ug | Freq: Every day | INTRAVENOUS | Status: DC
  Administered 2014-03-13 – 2014-03-22 (×10): 87.5 ug via INTRAVENOUS
  Filled 2014-03-13 (×10): qty 5

## 2014-03-13 MED ORDER — ACETAMINOPHEN 325 MG PO TABS: 650.0000 mg | ORAL_TABLET | Freq: Four times a day (QID) | ORAL | Status: DC | PRN

## 2014-03-13 MED ORDER — ENOXAPARIN SODIUM 30 MG/0.3ML ~~LOC~~ SOLN
30.0000 mg | SUBCUTANEOUS | Status: DC
  Administered 2014-03-13: 30 mg via SUBCUTANEOUS
  Filled 2014-03-13: qty 0.3

## 2014-03-13 MED ORDER — SODIUM CHLORIDE 0.9 % IV SOLN
INTRAVENOUS | Status: AC
  Administered 2014-03-13 (×2): via INTRAVENOUS

## 2014-03-13 MED ORDER — ONDANSETRON HCL 4 MG/2ML IJ SOLN: 4.0000 mg | Freq: Four times a day (QID) | INTRAMUSCULAR | Status: DC | PRN

## 2014-03-13 MED ORDER — ONDANSETRON HCL 4 MG PO TABS: 4.0000 mg | ORAL_TABLET | Freq: Four times a day (QID) | ORAL | Status: DC | PRN

## 2014-03-13 MED ORDER — PANTOPRAZOLE SODIUM 40 MG IV SOLR
40.0000 mg | Freq: Two times a day (BID) | INTRAVENOUS | Status: DC
  Administered 2014-03-13 – 2014-03-26 (×26): 40 mg via INTRAVENOUS
  Filled 2014-03-13 (×30): qty 40

## 2014-03-13 MED ORDER — HYDRALAZINE HCL 20 MG/ML IJ SOLN
10.0000 mg | INTRAMUSCULAR | Status: DC | PRN
  Administered 2014-03-13 – 2014-03-22 (×15): 10 mg via INTRAVENOUS
  Administered 2014-03-22: 08:00:00 via INTRAVENOUS
  Filled 2014-03-13 (×15): qty 1

## 2014-03-13 MED ORDER — IOHEXOL 300 MG/ML  SOLN
50.0000 mL | Freq: Once | INTRAMUSCULAR | Status: AC | PRN
  Administered 2014-03-13: 50 mL via ORAL

## 2014-03-13 MED ORDER — CETYLPYRIDINIUM CHLORIDE 0.05 % MT LIQD
7.0000 mL | Freq: Two times a day (BID) | OROMUCOSAL | Status: DC
  Administered 2014-03-13 – 2014-03-14 (×2): 7 mL via OROMUCOSAL

## 2014-03-13 NOTE — Progress Notes (Signed)
Patient ID: Shannon [name], female   DOB: Oct 25, 1923, 80 y.o.   MRN: [id number]    Subjective: No complaints except fullness.  No pain  Objective: Vital signs in last 24 hours: Temp:  [98.3 F (36.8 C)-99.9 F (37.7 C)] 98.3 F (36.8 C) (12/11 0604) Pulse Rate:  [53-84] 62 (12/11 0604) Resp:  [12-20] 16 (12/11 0604) BP: (150-206)/(56-97) 154/56 mmHg (12/11 0604) SpO2:  [97 %-100 %] 100 % (12/11 0604) Weight:  [132 lb (59.875 kg)-143 lb 4.8 oz (65 kg)] 143 lb 4.8 oz (65 kg) (12/11 0125) Last BM Date: 03/13/14  Intake/Output from previous day: 12/10 0701 - 12/11 0700 In: 583.3 [I.V.:583.3] Out: 425 [Urine:200; Stool:225] Intake/Output this shift: Total I/O In: -  Out: 125 [Urine:125]  PE: Abd: soft, some distention, +BS, NT  Lab Results:   Recent Labs  03/12/14 1808 03/13/14 0555  WBC 5.5 6.2  HGB 12.0 10.1*  HCT 34.7* 28.8*  PLT 289 240   BMET  Recent Labs  03/12/14 1808 03/13/14 0555  NA 135* 132*  K 4.3 4.3  CL 93* 98  CO2 24 22  GLUCOSE 101* 105*  BUN 18 16  CREATININE 1.26* 1.21*  CALCIUM 11.1* 9.4   PT/INR No results for input(s): LABPROT, INR in the last 72 hours. CMP     Component Value Date/Time   NA 132* 03/13/2014 0555   NA 132* 03/05/2014 1654   K 4.3 03/13/2014 0555   CL 98 03/13/2014 0555   CO2 22 03/13/2014 0555   GLUCOSE 105* 03/13/2014 0555   GLUCOSE 103* 03/05/2014 1654   BUN 16 03/13/2014 0555   BUN 22 03/05/2014 1654   CREATININE 1.21* 03/13/2014 0555   CREATININE 1.0 09/30/2013   CALCIUM 9.4 03/13/2014 0555   PROT 6.7 03/13/2014 0555   PROT 6.6 02/03/2014 1113   ALBUMIN 3.4* 03/13/2014 0555   AST 21 03/13/2014 0555   ALT 13 03/13/2014 0555   ALKPHOS 60 03/13/2014 0555   BILITOT 0.6 03/13/2014 0555   GFRNONAA 39* 03/13/2014 0555   GFRAA 45* 03/13/2014 0555   Lipase     Component Value Date/Time   LIPASE 49 03/12/2014 1808       Studies/Results: Ct Abdomen Pelvis Wo Contrast  03/12/2014   CLINICAL DATA:   Constant vomiting today, constipated since Saturday, RIGHT lower quadrant rectal pain, history diabetes, hypertension, prior gastric bezoar  EXAM: CT ABDOMEN AND PELVIS WITHOUT CONTRAST  TECHNIQUE: Multidetector CT imaging of the abdomen and pelvis was performed following the standard protocol without IV contrast. Sagittal and coronal MPR images reconstructed from axial data set.  COMPARISON:  03/07/2014  FINDINGS: Minimal bibasilar atelectasis.  Low-attenuation foci within liver, question cysts, unchanged.  Metallic foreign body at LEFT ventricle again identified, uncertain etiology.  Gallbladder surgically absent.  Probable tiny LEFT renal cyst 9 mm diameter.  Within limits of a nonenhanced exam, remainder of liver, spleen, pancreas, kidneys and adrenal glands normal appearance.  Persistent marked thickening of the pyloric channel and pre pyloric antrum gastric distention question partial gastric outlet obstruction.  Small amount of ingested contrast is present within proximal small bowel loops.  Large filling defect within the stomach, 12.2 x 10.0 x 15.2 cm, containing bubbly gas, consistent with recurrent bezoar.  Scattered colonic diverticula.  No evidence of bowel obstruction or dilatation.  Extensive atherosclerotic calcifications aorta and minimally in coronary arteries.  High attenuation nodule 13 mm diameter again identified adjacent to the thickened gastroesophageal junction, unchanged but since 03/07/2014 but new since 01/17/2014,  question contrast within epiphrenic diverticulum or small focal contained perforation, unlikely calcified lymph node since this is new in past 2 months.  Unremarkable bladder, ureters, uterus and adnexae.  Appendix not visualized.  No definite rectal wall thickening.  No additional mass, adenopathy, free fluid, free air or abscess.  Bones demineralized with scattered degenerative changes.  Surgical scar at ventral abdomen with a small incisional hernia containing a portion of  transverse colon.  IMPRESSION: Thickened pylorus and pre-pyloric antrum likely causing partial outlet obstruction.  Large filling defect within stomach consistent with recurrent bezoar 12.2 x 10.0 x 15.2 cm.  Colonic diverticulosis.  Suspected hepatic cysts unchanged.  13 mm high attenuation nodule adjacent to thickened gastroesophageal junction, question contained perforation versus epiphrenic diverticulum containing contrast, new since 01/17/2014 but unchanged since 03/07/2014.  Small incisional hernia containing a portion of the transverse colon wall without obstruction.   Electronically Signed   By: Ulyses Southward M.D.   On: 03/12/2014 23:12   Dg Esophagus W/water Sol Cm  03/13/2014   CLINICAL DATA:  Abnormal CT, evaluate for esophageal perforation  EXAM: ESOPHOGRAM/BARIUM SWALLOW  TECHNIQUE: Single contrast examination was performed using water-soluble contrast.  FLUOROSCOPY TIME:  1 min 7 seconds  COMPARISON:  CT abdomen pelvis dated 03/12/2014 and 03/07/2014  FINDINGS: Esophageal dysmotility.  No fixed esophageal narrowing/stricture.  Suspected small paraesophageal hernia (less likely hiatal hernia or esophageal diverticulum) at the GE junction. The appearance does not suggest a contained esophageal perforation.  Presence of gastroesophageal reflux could not be assessed due to the patient's inability to clear her esophagus.  Moderate food/debris in the stomach.  IMPRESSION: Esophageal dysmotility.  No fixed esophageal narrowing/stricture.  Suspected small paraesophageal hernia at the GE junction. No evidence of contained esophageal perforation.  Moderate food/debris in the stomach.   Electronically Signed   By: Charline Bills M.D.   On: 03/13/2014 09:44    Anti-infectives: Anti-infectives    None       Assessment/Plan  1. GOO secondary to large food bezoar in the stomach  Plan: 1. No evidence of GE junction perforation on UGI.   2. Await GI evaluation to make recommendations on food bezoar  and further treatment. 3. No surgical intervention needed at this time.   LOS: 1 day    Nox Talent E 03/13/2014, 10:55 AM Pager: [phone]

## 2014-03-13 NOTE — Progress Notes (Signed)
INITIAL NUTRITION ASSESSMENT  DOCUMENTATION CODES Per approved criteria  -Not Applicable   INTERVENTION: - Once diet upgraded, recommend Boost Plus BID>  - RD will continue to follow.   NUTRITION DIAGNOSIS: Inadequate oral intake related to inability to eat as evidenced by NPO.   Goal: Pt to meet >/= 90% of their estimated nutrition needs   Monitor:  Weight trend, NPO, labs  Reason for Assessment: MST  80 y.o. female  Admitting Dx: <principal problem not specified>  ASSESSMENT: 80 y.o. 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 Patient states following which patient's symptoms largely improved. But over the last few weeks patient's abdominal pain started to recur and worsen. CT abdomen and pelvis done shows partial gastric outlet obstruction with recurrence of the Bezoar. At this time patient has been placed on NG tube suction and admitted for further workup.  - Pt reports a 7 lb wt loss in the past month. She says that her usual body weight is 150 lbs. Pt follows a pureed diet at home and drinks Boost. Today she has tolerated broth, jello, juice, and ginger ale. Current diet is sips of clears.  - Pt with moderate wasting at the temples.   Height: Ht Readings from Last 1 Encounters:  03/13/14 5\' 6"  (1.676 m)    Weight: Wt Readings from Last 1 Encounters:  03/13/14 143 lb 4.8 oz (65 kg)    Ideal Body Weight: 59.3 kg  % Ideal Body Weight: 110%  Wt Readings from Last 10 Encounters:  03/13/14 143 lb 4.8 oz (65 kg)  03/12/14 132 lb (59.875 kg)  02/24/14 150 lb (68.04 kg)  02/18/14 152 lb (68.947 kg)  02/03/14 152 lb (68.947 kg)  01/21/14 159 lb 9.8 oz (72.4 kg)  01/07/14 155 lb (70.308 kg)  12/25/13 149 lb 4.8 oz (67.722 kg)  11/25/13 151 lb (68.493 kg)  10/15/13 152 lb 4.8 oz (69.083 kg)    Usual Body Weight: 150 lbs  % Usual Body Weight:  95.3%  BMI:  Body mass index is 23.14 kg/(m^2).  Estimated Nutritional Needs: Kcal: 1625-1950 Protein: 85-95 g Fluid: 2.0 L/day  Skin: intact  Diet Order: Diet NPO time specified  EDUCATION NEEDS: -Education needs addressed   Intake/Output Summary (Last 24 hours) at 03/13/14 1523 Last data filed at 03/13/14 1415  Gross per 24 hour  Intake 1343.33 ml  Output    900 ml  Net 443.33 ml    Last BM: 12/11   Labs:   Recent Labs Lab 03/07/14 1138 03/12/14 1808 03/13/14 0555  NA 129* 135* 132*  K 4.6 4.3 4.3  CL 92* 93* 98  CO2 22 24 22   BUN 22 18 16   CREATININE 1.12* 1.26* 1.21*  CALCIUM 10.5 11.1* 9.4  GLUCOSE 128* 101* 105*    CBG (last 3)   Recent Labs  03/13/14 1021 03/13/14 1259  GLUCAP 94 94    Scheduled Meds: . antiseptic oral rinse  7 mL Mouth Rinse BID  . levothyroxine  87.5 mcg Intravenous Daily  . pantoprazole (PROTONIX) IV  40 mg Intravenous Q12H    Continuous Infusions: . sodium chloride 100 mL/hr at 03/13/14 1000    Past Medical History  Diagnosis Date  . Pneumonia   . Thyroid disease   . Seizures   . Stroke   . Hyperlipidemia   . Hypertension   . Diabetes mellitus without complication   .  Small bowel obstruction   . Ventral hernia with bowel obstruction     Past Surgical History  Procedure Laterality Date  . Exploratory laparotomy  09-07-2013  . Cholecystectomy    . Tubal ligation    . Colectomy    . Hernia repair  2015  . Esophagogastroduodenoscopy N/A 01/19/2014    Procedure: ESOPHAGOGASTRODUODENOSCOPY (EGD);  Surgeon: Irene Shipper, MD;  Location: [facility];  Service: Endoscopy;  Laterality: N/A;  . Esophagogastroduodenoscopy N/A 01/20/2014    Procedure: ESOPHAGOGASTRODUODENOSCOPY (EGD);  Surgeon: Irene Shipper, MD;  Location: [facility];  Service: Endoscopy;  Laterality: N/A;    Laurette Schimke MS, RD, LDN

## 2014-03-13 NOTE — H&P (Signed)
[facility] History and Physical  Shannon [name] WUJ:[id number] DOB: [birth year] DOA: 03/12/2014  Referring physician: ER physician. PCP: Oneal Grout, MD   Chief Complaint: Abdominal pain.  HPI: Shannon [name] is a 80 y.o. 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 Patient states following which patient's symptoms largely improved. But over the last few weeks patient's abdominal pain started to recur and worsen. CT abdomen and pelvis done shows partial gastric outlet obstruction with recurrence of the Bezoar. At this time patient has been placed on NG tube suction and admitted for further workup. Patient denies any chest pain or shortness of breath. Patient had a large bowel movement in the ER. After which patient's symptoms have improved but still has abdominal pain. Abdomen is soft on exam and does have bowel movement.   Review of Systems: As presented in the history of presenting illness, rest negative.  Past Medical History  Diagnosis Date  . Pneumonia   . Thyroid disease   . Seizures   . Stroke   . Hyperlipidemia   . Hypertension   . Diabetes mellitus without complication   . Small bowel obstruction   . Ventral hernia with bowel obstruction    Past Surgical History  Procedure Laterality Date  . Exploratory laparotomy  09-07-2013  . Cholecystectomy    . Tubal ligation    . Colectomy    . Hernia repair  2015  . Esophagogastroduodenoscopy N/A 01/19/2014    Procedure: ESOPHAGOGASTRODUODENOSCOPY (EGD);  Surgeon: Hilarie Fredrickson, MD;  Location: [facility];  Service: Endoscopy;  Laterality: N/A;  . Esophagogastroduodenoscopy N/A 01/20/2014    Procedure: ESOPHAGOGASTRODUODENOSCOPY (EGD);  Surgeon: Hilarie Fredrickson, MD;  Location: [facility];  Service: Endoscopy;  Laterality: N/A;   Social History:  reports that she has never smoked. She does not have any smokeless tobacco  history on file. She reports that she does not drink alcohol or use illicit drugs. Where does patient live home. Can patient participate in ADLs? Yes.  No Known Allergies  Family History:  Family History  Problem Relation Age of Onset  . Stroke Mother   . Heart attack Neg Hx   . Hypertension Mother   . Diabetes Brother       Prior to Admission medications   Medication Sig Start Date End Date Taking? Authorizing Provider  acetaminophen (TYLENOL EX ST ARTHRITIS PAIN) 500 MG tablet Take 500 mg by mouth every 6 (six) hours as needed (for arthritis pain).   Yes Historical Provider, MD  aspirin EC 81 MG tablet Take 81 mg by mouth every morning.   Yes Historical Provider, MD  atorvastatin (LIPITOR) 10 MG tablet Take 10 mg by mouth daily at 6 PM.   Yes Historical Provider, MD  B Complex-C (B-COMPLEX WITH VITAMIN C) tablet Take 1 tablet by mouth 2 (two) times daily.   Yes Historical Provider, MD  calcium-vitamin D (OSCAL WITH D) 500-200 MG-UNIT per tablet Take 1 tablet by mouth 2 (two) times daily.   Yes Historical Provider, MD  cycloSPORINE (RESTASIS) 0.05 % ophthalmic emulsion Place 1 drop into both eyes 2 (two) times daily.   Yes Historical Provider, MD  Dietary Management Product (VASCULERA) TABS Take 1 tablet by mouth daily.   Yes Historical Provider, MD  fluticasone (FLONASE) 50 MCG/ACT nasal spray Place 2 sprays into both nostrils daily.   Yes Historical Provider, MD  gabapentin (NEURONTIN) 300 MG capsule Take  300 mg by mouth 3 (three) times daily.   Yes Historical Provider, MD  hydrALAZINE (APRESOLINE) 100 MG tablet Take 100 mg by mouth 3 (three) times daily.   Yes Historical Provider, MD  isosorbide mononitrate (IMDUR) 60 MG 24 hr tablet Take 60 mg by mouth every morning.   Yes Historical Provider, MD  l-methylfolate-B6-B12 (METANX) 3-35-2 MG TABS Take 1 tablet by mouth every morning.   Yes Historical Provider, MD  levothyroxine (SYNTHROID, LEVOTHROID) 175 MCG tablet Take 175 mcg by  mouth daily before breakfast.   Yes Historical Provider, MD  metoprolol succinate (TOPROL-XL) 50 MG 24 hr tablet Take 50 mg by mouth every morning. Take with or immediately following a meal.   Yes Historical Provider, MD  Multiple Vitamins-Minerals (MULTIVITAMIN WITH MINERALS) tablet Take 1 tablet by mouth every morning.    Yes Historical Provider, MD  pantoprazole (PROTONIX) 40 MG tablet Take 40 mg by mouth 2 (two) times daily before a meal. 02/03/14  Yes Historical Provider, MD  polyethylene glycol (MIRALAX / GLYCOLAX) packet Take 17 g by mouth daily as needed for mild constipation.   Yes Historical Provider, MD  sennosides-docusate sodium (SENOKOT-S) 8.6-50 MG tablet Take 1 tablet by mouth 2 (two) times daily.    Yes Historical Provider, MD  sertraline (ZOLOFT) 100 MG tablet Take 100 mg by mouth every morning.    Yes Historical Provider, MD  sucralfate (CARAFATE) 1 G tablet Take 1 g by mouth 3 (three) times daily with meals.   Yes Historical Provider, MD  Calcium Carbonate (CALTRATE 600 PO) Take 600 mg by mouth 2 (two) times daily.     Historical Provider, MD  traMADol (ULTRAM) 50 MG tablet Take 50 mg by mouth 2 (two) times daily as needed for moderate pain.     Historical Provider, MD    Physical Exam: Filed Vitals:   03/13/14 0015 03/13/14 0020 03/13/14 0025 03/13/14 0030  BP: 187/69 177/63 177/65 169/66  Pulse: 53 58 59 63  Temp:      TempSrc:      Resp: 15 13 13 14   SpO2: 100% 100% 100% 100%     General:  Moderately built and nourished.  Eyes: Anicteric no pallor.  ENT: No discharge from ears eyes nose mouth.  Neck: No mass felt.  Cardiovascular: S1-S2 heard.  Respiratory: No rhonchi or crepitations.  Abdomen: Soft nontender bowel sounds present. No guarding or rigidity.  Skin: No rash.  Musculoskeletal: No edema.  Psychiatric: Appears normal.  Neurologic: Alert awake oriented to time place and person. Moves all extremities.  Labs on Admission:  Basic Metabolic  Panel:  Recent Labs Lab 03/07/14 1138 03/12/14 1808  NA 129* 135*  K 4.6 4.3  CL 92* 93*  CO2 22 24  GLUCOSE 128* 101*  BUN 22 18  CREATININE 1.12* 1.26*  CALCIUM 10.5 11.1*   Liver Function Tests:  Recent Labs Lab 03/07/14 1138 03/12/14 1808  AST 28 26  ALT 20 16  ALKPHOS 69 77  BILITOT 0.6 0.7  PROT 7.8 8.3  ALBUMIN 4.0 4.4    Recent Labs Lab 03/07/14 1138 03/12/14 1808  LIPASE 41 49   No results for input(s): AMMONIA in the last 168 hours. CBC:  Recent Labs Lab 03/07/14 1138 03/12/14 1808  WBC 5.0 5.5  NEUTROABS 3.3 3.7  HGB 10.3* 12.0  HCT 30.3* 34.7*  MCV 88.9 89.0  PLT 276 289   Cardiac Enzymes:  Recent Labs Lab 03/07/14 1138 03/12/14 2023  TROPONINI <0.30 <0.30  BNP (last 3 results)  Recent Labs  01/17/14 2116  PROBNP 961.6*   CBG: No results for input(s): GLUCAP in the last 168 hours.  Radiological Exams on Admission: Ct Abdomen Pelvis Wo Contrast  03/12/2014   CLINICAL DATA:  Constant vomiting today, constipated since Saturday, RIGHT lower quadrant rectal pain, history diabetes, hypertension, prior gastric bezoar  EXAM: CT ABDOMEN AND PELVIS WITHOUT CONTRAST  TECHNIQUE: Multidetector CT imaging of the abdomen and pelvis was performed following the standard protocol without IV contrast. Sagittal and coronal MPR images reconstructed from axial data set.  COMPARISON:  03/07/2014  FINDINGS: Minimal bibasilar atelectasis.  Low-attenuation foci within liver, question cysts, unchanged.  Metallic foreign body at LEFT ventricle again identified, uncertain etiology.  Gallbladder surgically absent.  Probable tiny LEFT renal cyst 9 mm diameter.  Within limits of a nonenhanced exam, remainder of liver, spleen, pancreas, kidneys and adrenal glands normal appearance.  Persistent marked thickening of the pyloric channel and pre pyloric antrum gastric distention question partial gastric outlet obstruction.  Small amount of ingested contrast is present  within proximal small bowel loops.  Large filling defect within the stomach, 12.2 x 10.0 x 15.2 cm, containing bubbly gas, consistent with recurrent bezoar.  Scattered colonic diverticula.  No evidence of bowel obstruction or dilatation.  Extensive atherosclerotic calcifications aorta and minimally in coronary arteries.  High attenuation nodule 13 mm diameter again identified adjacent to the thickened gastroesophageal junction, unchanged but since 03/07/2014 but new since 01/17/2014, question contrast within epiphrenic diverticulum or small focal contained perforation, unlikely calcified lymph node since this is new in past 2 months.  Unremarkable bladder, ureters, uterus and adnexae.  Appendix not visualized.  No definite rectal wall thickening.  No additional mass, adenopathy, free fluid, free air or abscess.  Bones demineralized with scattered degenerative changes.  Surgical scar at ventral abdomen with a small incisional hernia containing a portion of transverse colon.  IMPRESSION: Thickened pylorus and pre-pyloric antrum likely causing partial outlet obstruction.  Large filling defect within stomach consistent with recurrent bezoar 12.2 x 10.0 x 15.2 cm.  Colonic diverticulosis.  Suspected hepatic cysts unchanged.  13 mm high attenuation nodule adjacent to thickened gastroesophageal junction, question contained perforation versus epiphrenic diverticulum containing contrast, new since 01/17/2014 but unchanged since 03/07/2014.  Small incisional hernia containing a portion of the transverse colon wall without obstruction.   Electronically Signed   By: Ulyses Southward M.D.   On: 03/12/2014 23:12     Assessment/Plan Active Problems:   Hypothyroidism   Gastric outlet obstruction   CKD (chronic kidney disease) stage 3, GFR 30-59 ml/min   Accelerated hypertension   Partial gastric outlet obstruction   1. Partial gastric outlet obstruction with Bezoar - at this time patient has been placed on NG tube suction.  Patient will be kept nothing by mouth. Protonix IV consult GI in a.m. Gentle hydration. 2. Possible contained perforation at the GE junction for the CAT scan - I have discussed with on-call surgeon Dr. Maisie Fus. Dr. Maisie Fus will be reviewing the CAT scan and discussing further. Patient presently nothing by mouth. 3. Accelerated hypertension - patient is on when necessary IV hydralazine for now closely observe blood pressure trends. 4. Hypothyroidism - IV Synthroid for now until patient can take orally. 5. Hypercalcemia - probably from dehydration. Hydrate and recheck calcium levels. 6. Chronic kidney disease stage III - closely follow metabolic panel. 7. Hyperlipidemia - continue statins on patient intake orally. 8. History of diabetes mellitus previously present on no  medications. Patient's family states patient was on antidiabetic medication when patient used to be on prednisone.    Code Status: Full code.  Family Communication: Patient's daughter at the bedside.  Disposition Plan: Admit to inpatient.    Latrelle Bazar N. [facility] Pager [phone].  If 7PM-7AM, please contact night-coverage [email or web address] Password [facility] 03/13/2014, 12:37 AM

## 2014-03-13 NOTE — Consult Note (Signed)
Referring Provider: [facility] Primary Care Physician:  Blanchie Serve, MD Primary Gastroenterologist:  none  Reason for Consultation:  Gastric outlet obstruction  HPI: Shannon [name] is a 80 y.o. female who was admitted late last evening through the emergency room with complaints of abdominal pain and regurgitation. Patient has somewhat complicated past medical history with remote gastro-jejunostomy which likely was done for peptic ulcer disease. She unfortunately had a perforation of an anastomotic ulcer at the gastro-jejunal anastomosis in June 2015 and underwent surgery at [facility]. She is cared for by a Dr. Ellie Lunch. She underwent resection of the anastomosis and revision with a jejunal jejunal anastomosis. She also had repair of an incisional hernia done at that same time. Patient was hospitalized here in October 2015 with abdominal pain nausea and vomiting and had endoscopy done twice during that admission with finding of a gastric bezoar. The initial endoscopy done on 1019 with no evidence for anatomic outlet obstruction and a normal-appearing pylorus and duodenum. Repeat EGD was done 24 hours later with attempt to break up the bezoar which was soft. It was felt that this would be able to pass through her stomach. She was discharged home on a pured diet and PPI therapy. Patient says that she did well for a week or so then began having recurrent abdominal pain and now has had progressive difficulty with abdominal discomfort and early satiety frequent sour belching and regurgitation. She says she hasn't been vomiting much. She has lost about 20 pounds. She also relates constipation. Evaluation in the ER last evening with CT scan showing persistent marked thickening of the pyloric channel and prepyloric antrum and a large filling defect in the stomach measuring 12 x 10 x 15 cm consistent with a bezoar. She had barium swallow done today which  shows a probable small  paraesophageal hernia, the barium did trickle into the duodenum and there is no evidence of extravasation.    Past Medical History  Diagnosis Date  . Pneumonia   . Thyroid disease   . Seizures   . Stroke   . Hyperlipidemia   . Hypertension   . Diabetes mellitus without complication   . Small bowel obstruction   . Ventral hernia with bowel obstruction     Past Surgical History  Procedure Laterality Date  . Exploratory laparotomy  09-07-2013  . Cholecystectomy    . Tubal ligation    . Colectomy    . Hernia repair  2015  . Esophagogastroduodenoscopy N/A 01/19/2014    Procedure: ESOPHAGOGASTRODUODENOSCOPY (EGD);  Surgeon: Irene Shipper, MD;  Location: [facility];  Service: Endoscopy;  Laterality: N/A;  . Esophagogastroduodenoscopy N/A 01/20/2014    Procedure: ESOPHAGOGASTRODUODENOSCOPY (EGD);  Surgeon: Irene Shipper, MD;  Location: [facility];  Service: Endoscopy;  Laterality: N/A;    Prior to Admission medications   Medication Sig Start Date End Date Taking? Authorizing Provider  acetaminophen (TYLENOL EX ST ARTHRITIS PAIN) 500 MG tablet Take 500 mg by mouth every 6 (six) hours as needed (for arthritis pain).   Yes Historical Provider, MD  aspirin EC 81 MG tablet Take 81 mg by mouth every morning.   Yes Historical Provider, MD  atorvastatin (LIPITOR) 10 MG tablet Take 10 mg by mouth daily at 6 PM.   Yes Historical Provider, MD  B Complex-C (B-COMPLEX WITH VITAMIN C) tablet Take 1 tablet by mouth 2 (two) times daily.   Yes Historical Provider, MD  calcium-vitamin D (OSCAL WITH D) 500-200 MG-UNIT per tablet  Take 1 tablet by mouth 2 (two) times daily.   Yes Historical Provider, MD  cycloSPORINE (RESTASIS) 0.05 % ophthalmic emulsion Place 1 drop into both eyes 2 (two) times daily.   Yes Historical Provider, MD  Dietary Management Product (VASCULERA) TABS Take 1 tablet by mouth daily.   Yes Historical Provider, MD  fluticasone (FLONASE) 50 MCG/ACT nasal spray Place 2 sprays into both  nostrils daily.   Yes Historical Provider, MD  gabapentin (NEURONTIN) 300 MG capsule Take 300 mg by mouth 3 (three) times daily.   Yes Historical Provider, MD  hydrALAZINE (APRESOLINE) 100 MG tablet Take 100 mg by mouth 3 (three) times daily.   Yes Historical Provider, MD  isosorbide mononitrate (IMDUR) 60 MG 24 hr tablet Take 60 mg by mouth every morning.   Yes Historical Provider, MD  l-methylfolate-B6-B12 (METANX) 3-35-2 MG TABS Take 1 tablet by mouth every morning.   Yes Historical Provider, MD  levothyroxine (SYNTHROID, LEVOTHROID) 175 MCG tablet Take 175 mcg by mouth daily before breakfast.   Yes Historical Provider, MD  metoprolol succinate (TOPROL-XL) 50 MG 24 hr tablet Take 50 mg by mouth every morning. Take with or immediately following a meal.   Yes Historical Provider, MD  Multiple Vitamins-Minerals (MULTIVITAMIN WITH MINERALS) tablet Take 1 tablet by mouth every morning.    Yes Historical Provider, MD  pantoprazole (PROTONIX) 40 MG tablet Take 40 mg by mouth 2 (two) times daily before a meal. 02/03/14  Yes Historical Provider, MD  polyethylene glycol (MIRALAX / GLYCOLAX) packet Take 17 g by mouth daily as needed for mild constipation.   Yes Historical Provider, MD  sennosides-docusate sodium (SENOKOT-S) 8.6-50 MG tablet Take 1 tablet by mouth 2 (two) times daily.    Yes Historical Provider, MD  sertraline (ZOLOFT) 100 MG tablet Take 100 mg by mouth every morning.    Yes Historical Provider, MD  sucralfate (CARAFATE) 1 G tablet Take 1 g by mouth 3 (three) times daily with meals.   Yes Historical Provider, MD  Calcium Carbonate (CALTRATE 600 PO) Take 600 mg by mouth 2 (two) times daily.     Historical Provider, MD  traMADol (ULTRAM) 50 MG tablet Take 50 mg by mouth 2 (two) times daily as needed for moderate pain.     Historical Provider, MD    Current Facility-Administered Medications  Medication Dose Route Frequency Provider Last Rate Last Dose  . 0.9 %  sodium chloride infusion    Intravenous STAT Jennifer L Piepenbrink, PA-C 100 mL/hr at 03/13/14 0010    . 0.9 %  sodium chloride infusion   Intravenous Continuous Rise Patience, MD      . acetaminophen (TYLENOL) tablet 650 mg  650 mg Oral Q6H PRN Rise Patience, MD       Or  . acetaminophen (TYLENOL) suppository 650 mg  650 mg Rectal Q6H PRN Rise Patience, MD      . antiseptic oral rinse (CPC / CETYLPYRIDINIUM CHLORIDE 0.05%) solution 7 mL  7 mL Mouth Rinse BID Rise Patience, MD   7 mL at 03/13/14 1003  . enoxaparin (LOVENOX) injection 30 mg  30 mg Subcutaneous Q24H Rise Patience, MD   30 mg at 03/13/14 1003  . hydrALAZINE (APRESOLINE) injection 10 mg  10 mg Intravenous Q4H PRN Rise Patience, MD      . levothyroxine (SYNTHROID, LEVOTHROID) injection 87.5 mcg  87.5 mcg Intravenous Daily Rise Patience, MD   87.5 mcg at 03/13/14 1002  . morphine  2 MG/ML injection 1 mg  1 mg Intravenous Q4H PRN Rise Patience, MD      . ondansetron [facility]) tablet 4 mg  4 mg Oral Q6H PRN Rise Patience, MD       Or  . ondansetron [facility]) injection 4 mg  4 mg Intravenous Q6H PRN Rise Patience, MD      . pantoprazole (PROTONIX) injection 40 mg  40 mg Intravenous Q12H Rise Patience, MD   40 mg at 03/13/14 0555    Allergies as of 03/12/2014  . (No Known Allergies)    Family History  Problem Relation Age of Onset  . Stroke Mother   . Heart attack Neg Hx   . Hypertension Mother   . Diabetes Brother     History   Social History  . Marital Status: Married    Spouse Name: N/A    Number of Children: N/A  . Years of Education: N/A   Occupational History  . Not on file.   Social History Main Topics  . Smoking status: Never Smoker   . Smokeless tobacco: Not on file  . Alcohol Use: No  . Drug Use: No  . Sexual Activity: Not on file   Other Topics Concern  . Not on file   Social History Narrative   Married in [place], lives in one story house with 2 people and no pets     Occupation: ?   Has a living Will, [place], and doesn't have DNR   Was living with Husband (in frail health) in [place] until 09/2013.  After her [facility] admission they both came to live with daughter in [place]: Pertinent positive and negative review of systems were noted in the above HPI section.  All other review of systems was otherwise negative.[name]  Physical Exam: Vital signs in last 24 hours: Temp:  [98.3 F (36.8 C)-99.9 F (37.7 C)] 98.3 F (36.8 C) (12/11 0604) Pulse Rate:  [53-84] 62 (12/11 0604) Resp:  [12-20] 16 (12/11 0604) BP: (150-206)/(56-97) 154/56 mmHg (12/11 0604) SpO2:  [97 %-100 %] 100 % (12/11 0604) Weight:  [132 lb (59.875 kg)-143 lb 4.8 oz (65 kg)] 143 lb 4.8 oz (65 kg) (12/11 0125) Last BM Date: 03/13/14 General:   Alert,  Well-developed,elderly AA female  well-nourished, pleasant and cooperative in NAD Head:  Normocephalic and atraumatic. Eyes:  Sclera clear, no icterus.   Conjunctiva pink. Ears:  Normal auditory acuity. Nose:  No deformity, discharge,  or lesions. Mouth:  No deformity or lesions.   Neck:  Supple; no masses or thyromegaly. Lungs:  Clear throughout to auscultation.   No wheezes, crackles, or rhonchi. Heart:  Regular rate and rhythm; no murmurs, clicks, rubs,  or gallops. Abdomen:  Soft,,protuberant , midline incisional scars, no definite succussion splash. Mild tenderness, no palp mass or HSM   Rectal:  Deferred  Msk:  Symmetrical without gross deformities. . Pulses:  Normal pulses noted. Extremities:  Without clubbing or edema. Neurologic:  Alert and  oriented x4;  grossly normal neurologically. Skin:  Intact without significant lesions or rashes.. Psych:  Alert and cooperative. Normal mood and affect.  Intake/Output from previous day: 12/10 0701 - 12/11 0700 In: 583.3 [I.V.:583.3] Out: 425 [Urine:200; Stool:225] Intake/Output this shift: Total I/O In: -  Out: 125 [Urine:125]  Lab Results:  Recent Labs   03/12/14 1808 03/13/14 0555  WBC 5.5 6.2  HGB 12.0 10.1*  HCT 34.7* 28.8*  PLT 289 240  BMET  Recent Labs  03/12/14 1808 03/13/14 0555  NA 135* 132*  K 4.3 4.3  CL 93* 98  CO2 24 22  GLUCOSE 101* 105*  BUN 18 16  CREATININE 1.26* 1.21*  CALCIUM 11.1* 9.4   LFT  Recent Labs  03/13/14 0555  PROT 6.7  ALBUMIN 3.4*  AST 21  ALT 13  ALKPHOS 60  BILITOT 0.6   IMPRESSION:  #58  80 year old African-American female with recurrent/persistent abdominal pain, early satiety and frequent regurgitation. Patient has a persistent large bezoar on CT scan and by CT concerns for marked thickening of the pyloric channel. 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 She had on since undergone resection and creation of a jejunal jejunal anastomosis at [facility]. Question whether current symptoms are secondary to dysmotility versus stricture versus edema. #2 diabetes mellitus #3 peripheral neuropathy Number for chronic kidney disease #5 hypothyroidism #6 hypertension #7 history of atrial fibrillation #8 prior CVA  Plan; Sips of clears for now Will discuss management further with Dr. Henrene Pastor regarding repeat EGD with attempt to break up the bezoar. She may also need upper GI to further delineate her anatomy and assess for stricture  etc. Twice a day PPI Stop Lovenox for now, will order SCD's   Amy Esterwood  03/13/2014, 10:58 AM  GI ATTENDING  History, laboratories, x-rays reviewed. Prior endoscopy report reviewed. Patient personally seen and examined. Agree with H&P as outlined above. The patient is known to me from previous hospitalization. She had a gastric bezoar secondary to gastroparesis which was pulverized. Apparently she has been on a modified diet. Now presents with nausea, vomiting, and weight loss. There was a question of esophageal perforation (contained) but esophagram negative. Plan upper  endoscopy tomorrow to reevaluate the status of bezoar (apparently has large amounts of food in stomach). It is important that her diabetes be under good control, but she is upright as much as possible, and continues with modified diet consisting of liquids and highly pured foods.  Docia Chuck. Geri Seminole., M.D. [facility] Division of Gastroenterology

## 2014-03-13 NOTE — Progress Notes (Signed)
Patient seen and examined, admitted this morning by Dr. Hal Hope.  Briefly 80 year old female with abdominal pain, nausea, vomiting, found to have gastric outlet obstruction. General surgery consulted. Agree with assessment and plan. CT abdomen and pelvis reviewed, I called gastroenterology consult. Appreciate GI recommendations, plan for endoscopy in a.m. Continue gentle hydration Will follow   Baby Stairs M.D. [facility] 03/13/2014, 2:26 PM  Pager: [phone]

## 2014-03-13 NOTE — Consult Note (Signed)
Reason for Consult: Gastric outlet obstruction, possible contained esophageal perforation  Referring Physician: Dr Burnard Bunting is an 80 y.o. female.  HPI: Pt with know GOO.  Has been treated before with similar symptoms in Oct and underwent EGD and pulverization.  States she began vomiting more over the last few days and came in the ED.  Denies any chest pain.  She is having BM's.  Was in ED 5 days ago and CT showed possible contained perforation at GE junction.  Her CT tonight shows a stable fluid collection.  NG ordered but pt's daughter refused.    Past Medical History  Diagnosis Date  . Pneumonia   . Thyroid disease   . Seizures   . Stroke   . Hyperlipidemia   . Hypertension   . Diabetes mellitus without complication   . Small bowel obstruction   . Ventral hernia with bowel obstruction     Past Surgical History  Procedure Laterality Date  . Exploratory laparotomy  09-07-2013  . Cholecystectomy    . Tubal ligation    . Colectomy    . Hernia repair  2015  . Esophagogastroduodenoscopy N/A 01/19/2014    Procedure: ESOPHAGOGASTRODUODENOSCOPY (EGD);  Surgeon: Irene Shipper, MD;  Location: [facility];  Service: Endoscopy;  Laterality: N/A;  . Esophagogastroduodenoscopy N/A 01/20/2014    Procedure: ESOPHAGOGASTRODUODENOSCOPY (EGD);  Surgeon: Irene Shipper, MD;  Location: [facility];  Service: Endoscopy;  Laterality: N/A;    Family History  Problem Relation Age of Onset  . Stroke Mother   . Heart attack Neg Hx   . Hypertension Mother   . Diabetes Brother     Social History:  reports that she has never smoked. She does not have any smokeless tobacco history on file. She reports that she does not drink alcohol or use illicit drugs.  Allergies: No Known Allergies  Medications: I have reviewed the patient's current medications.  Results for orders placed or performed during the hospital encounter of 03/12/14 (from the past 48 hour(s))  CBC with Differential      Status: Abnormal   Collection Time: 03/12/14  6:08 PM  Result Value Ref Range   WBC 5.5 4.0 - 10.5 K/uL   RBC 3.90 3.87 - 5.11 MIL/uL   Hemoglobin 12.0 12.0 - 15.0 g/dL   HCT 34.7 (L) 36.0 - 46.0 %   MCV 89.0 78.0 - 100.0 fL   MCH 30.8 26.0 - 34.0 pg   MCHC 34.6 30.0 - 36.0 g/dL   RDW 15.5 11.5 - 15.5 %   Platelets 289 150 - 400 K/uL   Neutrophils Relative % 66 43 - 77 %   Neutro Abs 3.7 1.7 - 7.7 K/uL   Lymphocytes Relative 16 12 - 46 %   Lymphs Abs 0.9 0.7 - 4.0 K/uL   Monocytes Relative 15 (H) 3 - 12 %   Monocytes Absolute 0.8 0.1 - 1.0 K/uL   Eosinophils Relative 2 0 - 5 %   Eosinophils Absolute 0.1 0.0 - 0.7 K/uL   Basophils Relative 1 0 - 1 %   Basophils Absolute 0.1 0.0 - 0.1 K/uL  Comprehensive metabolic panel     Status: Abnormal   Collection Time: 03/12/14  6:08 PM  Result Value Ref Range   Sodium 135 (L) 137 - 147 mEq/L   Potassium 4.3 3.7 - 5.3 mEq/L   Chloride 93 (L) 96 - 112 mEq/L   CO2 24 19 - 32 mEq/L   Glucose, Bld 101 (H)  70 - 99 mg/dL   BUN 18 6 - 23 mg/dL   Creatinine, Ser 1.26 (H) 0.50 - 1.10 mg/dL   Calcium 11.1 (H) 8.4 - 10.5 mg/dL   Total Protein 8.3 6.0 - 8.3 g/dL   Albumin 4.4 3.5 - 5.2 g/dL   AST 26 0 - 37 U/L   ALT 16 0 - 35 U/L   Alkaline Phosphatase 77 39 - 117 U/L   Total Bilirubin 0.7 0.3 - 1.2 mg/dL   GFR calc non Af Amer 37 (L) >90 mL/min   GFR calc Af Amer 43 (L) >90 mL/min    Comment: (NOTE) The eGFR has been calculated using the CKD EPI equation. This calculation has not been validated in all clinical situations. eGFR's persistently <90 mL/min signify possible Chronic Kidney Disease.    Anion gap 18 (H) 5 - 15  Lipase, blood     Status: None   Collection Time: 03/12/14  6:08 PM  Result Value Ref Range   Lipase 49 11 - 59 U/L  Urinalysis, Routine w reflex microscopic     Status: Abnormal   Collection Time: 03/12/14  7:47 PM  Result Value Ref Range   Color, Urine YELLOW YELLOW   APPearance CLEAR CLEAR   Specific Gravity,  Urine 1.017 1.005 - 1.030   pH 8.5 (H) 5.0 - 8.0   Glucose, UA NEGATIVE NEGATIVE mg/dL   Hgb urine dipstick NEGATIVE NEGATIVE   Bilirubin Urine NEGATIVE NEGATIVE   Ketones, ur NEGATIVE NEGATIVE mg/dL   Protein, ur >300 (A) NEGATIVE mg/dL   Urobilinogen, UA 0.2 0.0 - 1.0 mg/dL   Nitrite NEGATIVE NEGATIVE   Leukocytes, UA NEGATIVE NEGATIVE  Urine microscopic-add on     Status: Abnormal   Collection Time: 03/12/14  7:47 PM  Result Value Ref Range   Squamous Epithelial / LPF RARE RARE   WBC, UA 3-6 <3 WBC/hpf   RBC / HPF 3-6 <3 RBC/hpf   Bacteria, UA RARE RARE   Casts HYALINE CASTS (A) NEGATIVE  Troponin I     Status: None   Collection Time: 03/12/14  8:23 PM  Result Value Ref Range   Troponin I <0.30 <0.30 ng/mL    Comment:        Due to the release kinetics of cTnI, a negative result within the first hours of the onset of symptoms does not rule out myocardial infarction with certainty. If myocardial infarction is still suspected, repeat the test at appropriate intervals.   I-Stat CG4 Lactic Acid, ED     Status: None   Collection Time: 03/12/14  8:34 PM  Result Value Ref Range   Lactic Acid, Venous 1.49 0.5 - 2.2 mmol/L    Ct Abdomen Pelvis Wo Contrast  03/12/2014   CLINICAL DATA:  Constant vomiting today, constipated since Saturday, RIGHT lower quadrant rectal pain, history diabetes, hypertension, prior gastric bezoar  EXAM: CT ABDOMEN AND PELVIS WITHOUT CONTRAST  TECHNIQUE: Multidetector CT imaging of the abdomen and pelvis was performed following the standard protocol without IV contrast. Sagittal and coronal MPR images reconstructed from axial data set.  COMPARISON:  03/07/2014  FINDINGS: Minimal bibasilar atelectasis.  Low-attenuation foci within liver, question cysts, unchanged.  Metallic foreign body at LEFT ventricle again identified, uncertain etiology.  Gallbladder surgically absent.  Probable tiny LEFT renal cyst 9 mm diameter.  Within limits of a nonenhanced exam,  remainder of liver, spleen, pancreas, kidneys and adrenal glands normal appearance.  Persistent marked thickening of the pyloric channel and pre pyloric  antrum gastric distention question partial gastric outlet obstruction.  Small amount of ingested contrast is present within proximal small bowel loops.  Large filling defect within the stomach, 12.2 x 10.0 x 15.2 cm, containing bubbly gas, consistent with recurrent bezoar.  Scattered colonic diverticula.  No evidence of bowel obstruction or dilatation.  Extensive atherosclerotic calcifications aorta and minimally in coronary arteries.  High attenuation nodule 13 mm diameter again identified adjacent to the thickened gastroesophageal junction, unchanged but since 03/07/2014 but new since 01/17/2014, question contrast within epiphrenic diverticulum or small focal contained perforation, unlikely calcified lymph node since this is new in past 2 months.  Unremarkable bladder, ureters, uterus and adnexae.  Appendix not visualized.  No definite rectal wall thickening.  No additional mass, adenopathy, free fluid, free air or abscess.  Bones demineralized with scattered degenerative changes.  Surgical scar at ventral abdomen with a small incisional hernia containing a portion of transverse colon.  IMPRESSION: Thickened pylorus and pre-pyloric antrum likely causing partial outlet obstruction.  Large filling defect within stomach consistent with recurrent bezoar 12.2 x 10.0 x 15.2 cm.  Colonic diverticulosis.  Suspected hepatic cysts unchanged.  13 mm high attenuation nodule adjacent to thickened gastroesophageal junction, question contained perforation versus epiphrenic diverticulum containing contrast, new since 01/17/2014 but unchanged since 03/07/2014.  Small incisional hernia containing a portion of the transverse colon wall without obstruction.   Electronically Signed   By: Lavonia Dana M.D.   On: 03/12/2014 23:12    Review of Systems  Constitutional: Negative for  fever and chills.  Eyes: Negative for blurred vision.  Respiratory: Negative for cough and shortness of breath.   Cardiovascular: Negative for chest pain.  Gastrointestinal: Positive for nausea and vomiting.  Genitourinary: Negative for dysuria, urgency and frequency.  Neurological: Negative for dizziness and headaches.   Blood pressure 188/71, pulse 70, temperature 98.4 F (36.9 C), temperature source Oral, resp. rate 16, height '5\' 6"'  (1.676 m), weight 143 lb 4.8 oz (65 kg), SpO2 100 %. Physical Exam  Constitutional: She appears well-developed and well-nourished. No distress.  HENT:  Head: Normocephalic and atraumatic.  Eyes: Conjunctivae are normal. Pupils are equal, round, and reactive to light.  Neck: Normal range of motion. Neck supple.  No crepitus  Cardiovascular: Normal rate and regular rhythm.   Respiratory: Effort normal and breath sounds normal.  GI: Soft. She exhibits no distension. There is no tenderness. There is no rebound and no guarding.  Musculoskeletal: Normal range of motion.  Neurological: She is alert.  Skin: Skin is warm and dry. She is not diaphoretic.    Assessment/Plan: Strict NPO.  Will get GG swallow eval to determine GE junction findings on CT.  Will await GI recs as well.  If this is a GE junction perforation, it does not appear to symptomatic and is not acute, as it was seen on CT 5 days ago.  Usiel Astarita C. [id number], 5:03 AM

## 2014-03-14 ENCOUNTER — Inpatient Hospital Stay (HOSPITAL_COMMUNITY): Payer: Medicare Other

## 2014-03-14 ENCOUNTER — Encounter (HOSPITAL_COMMUNITY)
Admission: EM | Disposition: A | Discharge status: Other Acute Inpatient Hospital | Payer: Self-pay | Source: Home / Self Care | Attending: Internal Medicine

## 2014-03-14 DIAGNOSIS — E038 Other specified hypothyroidism: Secondary | ICD-10-CM

## 2014-03-14 DIAGNOSIS — R1013 Epigastric pain: Secondary | ICD-10-CM

## 2014-03-14 DIAGNOSIS — K210 Gastro-esophageal reflux disease with esophagitis: Secondary | ICD-10-CM

## 2014-03-14 LAB — GLUCOSE, CAPILLARY
Glucose-Capillary: 103 mg/dL — ABNORMAL HIGH (ref 70–99)
Glucose-Capillary: 79 mg/dL (ref 70–99)
Glucose-Capillary: 83 mg/dL (ref 70–99)
Glucose-Capillary: 83 mg/dL (ref 70–99)
Glucose-Capillary: 83 mg/dL (ref 70–99)
Glucose-Capillary: 92 mg/dL (ref 70–99)

## 2014-03-14 LAB — TROPONIN I
Troponin I: <0.3 ng/mL (ref <0.30)
Troponin I: <0.3 ng/mL (ref <0.30)
Troponin I: <0.3 ng/mL (ref <0.30)

## 2014-03-14 SURGERY — EGD (ESOPHAGOGASTRODUODENOSCOPY)
Anesthesia: Moderate Sedation

## 2014-03-14 MED ORDER — GI COCKTAIL ~~LOC~~
30.0000 mL | Freq: Three times a day (TID) | ORAL | Status: DC | PRN
  Administered 2014-03-14: 30 mL via ORAL
  Filled 2014-03-14 (×3): qty 30

## 2014-03-14 MED ORDER — NITROGLYCERIN 0.4 MG SL SUBL
SUBLINGUAL_TABLET | SUBLINGUAL | Status: AC
  Filled 2014-03-14: qty 1

## 2014-03-14 MED ORDER — METOPROLOL TARTRATE 1 MG/ML IV SOLN
5.0000 mg | Freq: Three times a day (TID) | INTRAVENOUS | Status: DC
  Administered 2014-03-14: 5 mg via INTRAVENOUS
  Filled 2014-03-14 (×4): qty 5

## 2014-03-14 MED ORDER — MORPHINE SULFATE 2 MG/ML IJ SOLN: 2.0000 mg | INTRAMUSCULAR | Status: DC | PRN

## 2014-03-14 MED ORDER — METOPROLOL TARTRATE 1 MG/ML IV SOLN
5.0000 mg | Freq: Four times a day (QID) | INTRAVENOUS | Status: DC
  Administered 2014-03-14: 5 mg via INTRAVENOUS
  Filled 2014-03-14 (×3): qty 5

## 2014-03-14 MED ORDER — MORPHINE SULFATE 2 MG/ML IJ SOLN
1.0000 mg | INTRAMUSCULAR | Status: DC | PRN
  Administered 2014-03-14 – 2014-03-18 (×9): 2 mg via INTRAVENOUS
  Filled 2014-03-14 (×10): qty 1

## 2014-03-14 MED ORDER — NITROGLYCERIN 0.4 MG SL SUBL
0.4000 mg | SUBLINGUAL_TABLET | SUBLINGUAL | Status: DC | PRN
  Administered 2014-03-14 (×2): 0.4 mg via SUBLINGUAL

## 2014-03-14 MED ORDER — METOPROLOL TARTRATE 1 MG/ML IV SOLN
7.5000 mg | Freq: Four times a day (QID) | INTRAVENOUS | Status: DC
  Administered 2014-03-15 – 2014-03-18 (×13): 7.5 mg via INTRAVENOUS
  Filled 2014-03-14 (×14): qty 10

## 2014-03-14 MED ORDER — NITROGLYCERIN 0.3 MG SL SUBL: 0.3000 mg | SUBLINGUAL_TABLET | SUBLINGUAL | Status: DC | PRN

## 2014-03-14 NOTE — Progress Notes (Signed)
Patient ID: Shannon [name], female   DOB: November 05, 1923, 80 y.o.   MRN: [id number]  [facility] Progress Note  Subjective: Pt had chest pain, and SOB about 6 am today- required NTG and morphine- troponins pending. Has been hypertensive Chest pressure is back now- feels like something heavy on chest   Objective:  Vital signs in last 24 hours: Temp:  [97.9 F (36.6 C)-98.7 F (37.1 C)] 98.7 F (37.1 C) (12/12 0530) Pulse Rate:  [59-76] 61 (12/12 0648) Resp:  [16-24] 22 (12/12 0648) BP: (142-217)/(51-84) 178/62 mmHg (12/12 0648) SpO2:  [97 %-100 %] 100 % (12/12 0648) Weight:  [152 lb 1.9 oz (69 kg)] 152 lb 1.9 oz (69 kg) (12/12 0430) Last BM Date: 03/13/14 General:   Alert,  Well-developed, AA female   in NAD Heart:  Regular rate and rhythm; no murmurs Pulm; decreased BS  Abdomen:  Soft, minimally tender and nondistended. Normal bowel sounds, without guarding, and without rebound.   Extremities:  Without edema. Neurologic:  Alert and  oriented x4;  grossly normal neurologically. Psych:  Alert and cooperative. Normal mood and affect.  Intake/Output from previous day: 12/11 0701 - 12/12 0700 In: 1160 [P.O.:360; I.V.:800] Out: 1575 [Urine:1575] Intake/Output this shift:    Lab Results:  Recent Labs  03/12/14 1808 03/13/14 0555  WBC 5.5 6.2  HGB 12.0 10.1*  HCT 34.7* 28.8*  PLT 289 240   BMET  Recent Labs  03/12/14 1808 03/13/14 0555  NA 135* 132*  K 4.3 4.3  CL 93* 98  CO2 24 22  GLUCOSE 101* 105*  BUN 18 16  CREATININE 1.26* 1.21*  CALCIUM 11.1* 9.4   LFT  Recent Labs  03/13/14 0555  PROT 6.7  ALBUMIN 3.4*  AST 21  ALT 13  ALKPHOS 60  BILITOT 0.6    Assessment / Plan: #1 80 yo female with gastric bezoar with abdominal pain and nausea- was to have EGD this am - last EGD  01/20/2014, with no evidence  of obstruction,stricture- felt primarily to be dysmotility/gastroparesis  #2 Chest pain with SOB- apparently had a stress test a few weeks  ago that was negative- she responded to NTG and morphine earlier- stiill c/o chest pressure now- needs to be ruled out from cardiopulmonary standpoint,will cancel EGD for today Am not convinced this is GI , but certainly appropriate  To place NG , and give GI cocktail as well #3 HTN #4 DM #5 CKD  Active Problems:   Hypothyroidism   Gastric outlet obstruction   CKD (chronic kidney disease) stage 3, GFR 30-59 ml/min   Accelerated hypertension   Partial gastric outlet obstruction   Abdominal pain      LOS: 2 days   Amy Esterwood  03/14/2014, 9:02 AM   GI ATTENDING  AGREE WITH ABOVE. COMPLAINTS ARE NOT OBVIOUSLY GI AND NEED EVALUATED AS YOU ARE. I WOULD NOT PLACE NG TUBE UNLESS SHE IS VOMITING. NO PLANS FOR ENDOSCOPY THIS WEEKEND. WILL FOLLOW  Docia Chuck. Geri Seminole., M.D. [facility] Division of Gastroenterology

## 2014-03-14 NOTE — Progress Notes (Signed)
NG tube, 57F placed in right nare.  No resistance, gastric bubble auscultated by [employer] RN, gastric contents aspirated and tube placed to intermittent suction.  Patient tolerated well, will continue to monitor.

## 2014-03-14 NOTE — Significant Event (Signed)
Rapid Response Event Note  Overview: Time Called: [id number] Arrival Time: 0600 Event Type: Cardiac  Initial Focused Assessment:PT C/O OF ACUTE LEFT CHEST PAIN. RESPIRATIONS LABORED AND PT ANXIOUS. ASSESSMENT UNREMARKABLE. MORPHINE WAS GIVEN UPON ARRIVAL WITH SLIGHT DECREASED PAIN.    Interventions:EKG, NTG 0.4MG  X 2 SL--MD NOTIFIED AND LABS ORDERED TO FOLLOW UP. PT PAIN FREE AFTER MORPHINE AND NTG X2   Event Summary: Name of Physician Notified: DR. Cline Cools at [phone]    at    Outcome: Stayed in room and stabalized  Event End Time: 0650  Pricilla Riffle

## 2014-03-14 NOTE — Progress Notes (Signed)
Pt complaining of chest pain. Pt started on oxygen. And pt medicated with morphine. Rapid response called.

## 2014-03-14 NOTE — Progress Notes (Signed)
PATIENT DETAILS Name: Shannon [name] Age: 80 y.o. Sex: female Date of Birth: 1923/08/17 Admit Date: 03/12/2014 Admitting Physician Eduard Clos, MD BJY:NWGNFA, [facility], MD  Brief Summary: [name] is a 80 y.o. female with remote gastro-jejunostomy for peptic ulcer disease. She unfortunately had a perforation of an anastomotic ulcer at the gastro-jejunal anastomosis in June 2015 and underwent surgery at [facility].Patient was hospitalized here in October 2015 with abdominal pain nausea and vomiting and had endoscopy done twice during that admission with finding of a gastric bezoar.She was discharged home on a pured diet and PPI therapy.She unfortunately present on 12/10 with abd pain/vomiting, CT scan of abdomen showed persistent marked thickening of the pyloric channel and prepyloric antrum and a large filling defect in the stomach measuring 12 x 10 x 15 cm consistent with a bezoar.She was admitted for further evaluation and treatment. GI and Gen Surgery have been consulted  Subjective: Chest pain this am-relieved with GI cocktail and IV Morphine.Claims feels "pressure" in her upper abdomen area.Belching throughout the night.  Assessment/Plan: Active Problems:   Gastric Outlet Obstruction:secondary to Bezoar. Initially some question of perforation at GE junction on CT Scan, but this was ruled out on Barium Esophagogram. Since continues to feel floated/belching-having chest pain-suspect from upward pressure from outlet obstruction-will place NGT for decompression. GI following, EGD at some point.Keep NPO.Supportive care.    Chest Pain:suspect secondary to above and reflux. EKG, enzymes negative. Recent outpatient Nuclear stress test on 02/25/14 negative as well. Place NGT for decompression, continue PPI, GI cocktail prn. Monitor in telemetry. Consult cards if enzymes positive or recurrent chest pain.   Uncontrolled OZH:YQMVHQ IV Lopressor ot every q 6 hours,continue prn IV  Hydralazine. Will follow and adjust medications accordingly.    Hypothyroidism:continue with IV Levothyroxine.    CKD (chronic kidney disease) stage 3, GFR 30-59 ml/min:creatinine close to usual baseline. Monitor lytes periodically  Mild Hypercalcemia:secondary to dehydration, resolved with IVF. Monitor lytes periodically   Hx of Depression: since NPO-all meds on hold-resume when able   Hx of CVA:non focal exam-resume ASA/Statins when able-currently NPO   Remote hx of Gastrojejunostomy with perforation of anastomotic ulcer in June 2015-s/p revision/laparotomy  Disposition: Remain inpatient  Antibiotics:  None   Anti-infectives    None      DVT Prophylaxis: SCD's  Code Status: Full code   Family Communication None at bedside  Procedures:  None  CONSULTS:  GI and general surgery  Time spent 40 minutes-which includes 50% of the time with face-to-face with patient/ family and coordinating care related to the above assessment and plan.  MEDICATIONS: Scheduled Meds: . antiseptic oral rinse  7 mL Mouth Rinse BID  . levothyroxine  87.5 mcg Intravenous Daily  . metoprolol  5 mg Intravenous 3 times per day  . pantoprazole (PROTONIX) IV  40 mg Intravenous Q12H   Continuous Infusions:  PRN Meds:.acetaminophen **OR** acetaminophen, gi cocktail, hydrALAZINE, morphine injection, nitroGLYCERIN, ondansetron **OR** ondansetron (ZOFRAN) IV    PHYSICAL EXAM: Vital signs in last 24 hours: Filed Vitals:   03/14/14 0648 03/14/14 0952 03/14/14 1222 03/14/14 1336  BP: 178/62 193/62 202/70 175/61  Pulse: 61 57  56  Temp:  98.4 F (36.9 C)  98.2 F (36.8 C)  TempSrc:  Oral  Oral  Resp: 22 20  20   Height:      Weight:      SpO2: 100% 100%  99%    Weight change: 4 kg (8 lb  13.1 oz) Filed Weights   03/13/14 0125 03/14/14 0430  Weight: 65 kg (143 lb 4.8 oz) 69 kg (152 lb 1.9 oz)   Body mass index is 24.56 kg/(m^2).   Gen Exam: Awake and alert with clear speech.     Neck: Supple, No JVD.   Chest: B/L Clear.   CVS: S1 S2 Regular, no murmurs.  Abdomen: soft, BS +, non tender, mildly distended in upper abdomen.  Extremities: no edema, lower extremities warm to touch. Neurologic:Moves all 4 extremities Skin: No Rash.   Wounds: N/A.    Intake/Output from previous day:  Intake/Output Summary (Last 24 hours) at 03/14/14 1410 Last data filed at 03/14/14 1336  Gross per 24 hour  Intake    400 ml  Output   2150 ml  Net  -1750 ml     LAB RESULTS: CBC  Recent Labs Lab 03/12/14 1808 03/13/14 0555  WBC 5.5 6.2  HGB 12.0 10.1*  HCT 34.7* 28.8*  PLT 289 240  MCV 89.0 87.5  MCH 30.8 30.7  MCHC 34.6 35.1  RDW 15.5 15.6*  LYMPHSABS 0.9 0.7  MONOABS 0.8 1.1*  EOSABS 0.1 0.2  BASOSABS 0.1 0.1    Chemistries   Recent Labs Lab 03/12/14 1808 03/13/14 0555  NA 135* 132*  K 4.3 4.3  CL 93* 98  CO2 24 22  GLUCOSE 101* 105*  BUN 18 16  CREATININE 1.26* 1.21*  CALCIUM 11.1* 9.4    CBG:  Recent Labs Lab 03/13/14 1956 03/13/14 2326 03/14/14 0422 03/14/14 0756 03/14/14 1212  GLUCAP 105* 85 92 83 103*    GFR Estimated Creatinine Clearance: 30.1 mL/min (by C-G formula based on Cr of 1.21).  Coagulation profile No results for input(s): INR, PROTIME in the last 168 hours.  Cardiac Enzymes  Recent Labs Lab 03/12/14 2023 03/14/14 0737  TROPONINI <0.30 <0.30    Invalid input(s): POCBNP No results for input(s): DDIMER in the last 72 hours. No results for input(s): HGBA1C in the last 72 hours. No results for input(s): CHOL, HDL, LDLCALC, TRIG, CHOLHDL, LDLDIRECT in the last 72 hours. No results for input(s): TSH, T4TOTAL, T3FREE, THYROIDAB in the last 72 hours.  Invalid input(s): FREET3 No results for input(s): VITAMINB12, FOLATE, FERRITIN, TIBC, IRON, RETICCTPCT in the last 72 hours.  Recent Labs  03/12/14 1808  LIPASE 49    Urine Studies No results for input(s): UHGB, CRYS in the last 72 hours.  Invalid input(s):  UACOL, UAPR, USPG, UPH, UTP, UGL, UKET, UBIL, UNIT, UROB, ULEU, UEPI, UWBC, URBC, UBAC, CAST, UCOM, BILUA  MICROBIOLOGY: Recent Results (from the past 240 hour(s))  Urine culture     Status: None   Collection Time: 03/07/14 11:41 AM  Result Value Ref Range Status   Specimen Description URINE, CLEAN CATCH  Final   Special Requests NONE  Final   Culture  Setup Time   Final    03/07/2014 20:02 Performed at [facility] Count   Final    75,000 COLONIES/ML Performed at [facility]    Culture   Final    Multiple bacterial morphotypes present, none predominant. Suggest appropriate recollection if clinically indicated. Performed at [facility]    Report Status 03/08/2014 FINAL  Final  Urine culture     Status: None   Collection Time: 03/12/14  7:47 PM  Result Value Ref Range Status   Specimen Description URINE, RANDOM  Final   Special Requests NONE  Final   Culture  Setup Time   Final    03/13/2014 01:31 Performed at [facility] Count   Final    50,000 COLONIES/ML Performed at [facility]    Culture   Final    Multiple bacterial morphotypes present, none predominant. Suggest appropriate recollection if clinically indicated. Performed at [facility]    Report Status 03/13/2014 FINAL  Final    RADIOLOGY STUDIES/RESULTS: Ct Abdomen Pelvis Wo Contrast  03/12/2014   CLINICAL DATA:  Constant vomiting today, constipated since Saturday, RIGHT lower quadrant rectal pain, history diabetes, hypertension, prior gastric bezoar  EXAM: CT ABDOMEN AND PELVIS WITHOUT CONTRAST  TECHNIQUE: Multidetector CT imaging of the abdomen and pelvis was performed following the standard protocol without IV contrast. Sagittal and coronal MPR images reconstructed from axial data set.  COMPARISON:  03/07/2014  FINDINGS: Minimal bibasilar atelectasis.  Low-attenuation foci within liver, question cysts, unchanged.  Metallic foreign body at  LEFT ventricle again identified, uncertain etiology.  Gallbladder surgically absent.  Probable tiny LEFT renal cyst 9 mm diameter.  Within limits of a nonenhanced exam, remainder of liver, spleen, pancreas, kidneys and adrenal glands normal appearance.  Persistent marked thickening of the pyloric channel and pre pyloric antrum gastric distention question partial gastric outlet obstruction.  Small amount of ingested contrast is present within proximal small bowel loops.  Large filling defect within the stomach, 12.2 x 10.0 x 15.2 cm, containing bubbly gas, consistent with recurrent bezoar.  Scattered colonic diverticula.  No evidence of bowel obstruction or dilatation.  Extensive atherosclerotic calcifications aorta and minimally in coronary arteries.  High attenuation nodule 13 mm diameter again identified adjacent to the thickened gastroesophageal junction, unchanged but since 03/07/2014 but new since 01/17/2014, question contrast within epiphrenic diverticulum or small focal contained perforation, unlikely calcified lymph node since this is new in past 2 months.  Unremarkable bladder, ureters, uterus and adnexae.  Appendix not visualized.  No definite rectal wall thickening.  No additional mass, adenopathy, free fluid, free air or abscess.  Bones demineralized with scattered degenerative changes.  Surgical scar at ventral abdomen with a small incisional hernia containing a portion of transverse colon.  IMPRESSION: Thickened pylorus and pre-pyloric antrum likely causing partial outlet obstruction.  Large filling defect within stomach consistent with recurrent bezoar 12.2 x 10.0 x 15.2 cm.  Colonic diverticulosis.  Suspected hepatic cysts unchanged.  13 mm high attenuation nodule adjacent to thickened gastroesophageal junction, question contained perforation versus epiphrenic diverticulum containing contrast, new since 01/17/2014 but unchanged since 03/07/2014.  Small incisional hernia containing a portion of the  transverse colon wall without obstruction.   Electronically Signed   By: Ulyses Southward M.D.   On: 03/12/2014 23:12   Ct Abdomen Pelvis W Contrast  03/07/2014   CLINICAL DATA:  80 year old female presenting with rectal pressure gradually worsening over the prior month.  EXAM: CT ABDOMEN AND PELVIS WITH CONTRAST  TECHNIQUE: Multidetector CT imaging of the abdomen and pelvis was performed using the standard protocol following bolus administration of intravenous contrast.  CONTRAST:  50mL OMNIPAQUE IOHEXOL 300 MG/ML SOLN, 80mL OMNIPAQUE IOHEXOL 300 MG/ML SOLN  COMPARISON:  CT of the abdomen and pelvis 01/17/2014.  FINDINGS: Lower chest: Cardiomegaly with biatrial dilatation. Calcifications of the aortic valve. Atherosclerotic calcifications in the left main and left circumflex coronary arteries. Small hiatal hernia.  Hepatobiliary: Multifocal low-attenuation lesions appear unchanged in size, number and distribution scattered throughout the liver. Many these are too small to definitively characterize, while the larger lesions  are intermediate attenuation, favored to represent proteinaceous cysts. The largest of these lesions measures only 12 mm in segment 2 (image 21 of series 2). No new hepatic lesions are noted. No intra or extrahepatic biliary ductal dilatation. Status post cholecystectomy.  Pancreas: Unremarkable.  Spleen: Unremarkable.  Adrenals/Urinary Tract: Again noted is a 1.2 cm intermediate attenuation (23 HU) lesion in the lower pole of the left kidney, with some surrounding architectural distortion, favored to represent a previously ruptured proteinaceous cyst. No suspicious renal lesions are noted. Multifocal cortical thinning in the kidneys bilaterally, presumably mild scarring. No hydroureteronephrosis. Urinary bladder is normal in appearance. Bilateral adrenal glands are normal in appearance.  Stomach/Bowel: As with the prior examination there is extensive thickening of the gastric wall in the antral  pre-pyloric region of the stomach. The pylorus is also markedly thickened (up to 15 mm in thickness). The stomach is moderately distended. No pathologic dilatation of small bowel or colon. Postoperative changes of partial colectomy near the rectosigmoid junction are noted. A few scattered colonic diverticulae are noted, without surrounding inflammatory changes to suggest an acute diverticulitis at this time. The distal rectum and anus are grossly unremarkable in appearance.  Vascular/Lymphatic: Extensive atherosclerosis throughout the abdominal and pelvic vasculature, without evidence of aneurysm or dissection. No lymphadenopathy noted in the abdomen or pelvis.  Reproductive: Retroverted uterus.  Ovaries are atrophic.  Other: No significant volume of ascites.  No pneumoperitoneum.  Musculoskeletal: Tiny ventral hernia, through which there is incomplete herniation of the mid transverse colon; no findings to suggest associated bowel incarceration or obstruction at this time. Old compression fracture of superior endplate of T11 with approximately 20% loss of anterior vertebral body height (unchanged). There are no aggressive appearing lytic or blastic lesions noted in the visualized portions of the skeleton.  IMPRESSION: 1. No findings to account for the patient's worsening rectal pressure. 2. There is persistent thickening of the pylorus and antral pre-pyloric region of the stomach, which could be inflammatory in the setting of ulcer disease, or could be neoplastic. There is persistent moderate dilatation of the stomach, suggesting some degree of gastric outlet obstruction. Correlation with results of recent upper endoscopy is suggested. 3. Additional incidental findings, as above, similar to prior examination.   Electronically Signed   By: Trudie Reed M.D.   On: 03/07/2014 16:28   Dg Chest Port 1 View  03/14/2014   CLINICAL DATA:  80 year old female with new onset chest pain beginning today  EXAM: PORTABLE  CHEST - 1 VIEW  COMPARISON:  None.  FINDINGS: Cardiomegaly. Atherosclerotic calcification present in the transverse aorta. No focal airspace consolidation, pulmonary edema, pneumothorax. a query trace left pleural effusion. No acute osseous abnormality. The left glenohumeral joint osteoarthritis. Mild coarsening of the interstitial markings.  IMPRESSION: 1. Query trace left pleural effusion. 2. Cardiomegaly. 3. Aortic atherosclerosis.   Electronically Signed   By: Malachy Moan M.D.   On: 03/14/2014 10:12   Dg Abd Portable 2v  03/14/2014   CLINICAL DATA:  80 year old female with persistent abdominal pain and fullness.  EXAM: PORTABLE ABDOMEN - 2 VIEW  COMPARISON:  Esophagram performed yesterday. CT abdomen/ pelvis 03/12/2014  FINDINGS: No evidence of free air on the lateral decubitus view. Contrast material is noted within the stomach and scattered throughout the large bowel. There is no evidence of obstruction. The volume of contrast material remaining within the stomach is greater than expected. The bones are diffusely osteopenic. Surgical clips in the right upper quadrant suggest prior cholecystectomy.  IMPRESSION:  1. No evidence of obstruction or free air. 2. Greater than expected volume of retained oral contrast within the stomach. Query clinical history of delayed gastric emptying or gastroparesis? 3. The bones appear osteopenic.   Electronically Signed   By: Malachy Moan M.D.   On: 03/14/2014 10:04   Dg Esophagus W/water Sol Cm  03/13/2014   CLINICAL DATA:  Abnormal CT, evaluate for esophageal perforation  EXAM: ESOPHOGRAM/BARIUM SWALLOW  TECHNIQUE: Single contrast examination was performed using water-soluble contrast.  FLUOROSCOPY TIME:  1 min 7 seconds  COMPARISON:  CT abdomen pelvis dated 03/12/2014 and 03/07/2014  FINDINGS: Esophageal dysmotility.  No fixed esophageal narrowing/stricture.  Suspected small paraesophageal hernia (less likely hiatal hernia or esophageal diverticulum) at the  GE junction. The appearance does not suggest a contained esophageal perforation.  Presence of gastroesophageal reflux could not be assessed due to the patient's inability to clear her esophagus.  Moderate food/debris in the stomach.  IMPRESSION: Esophageal dysmotility.  No fixed esophageal narrowing/stricture.  Suspected small paraesophageal hernia at the GE junction. No evidence of contained esophageal perforation.  Moderate food/debris in the stomach.   Electronically Signed   By: Charline Bills M.D.   On: 03/13/2014 09:44    Jeoffrey Massed, MD  [facility] Pager:[phone]  If 7PM-7AM, please contact night-coverage [email or web address] Password [email or web address] 03/14/2014, 2:10 PM   LOS: 2 days

## 2014-03-14 NOTE — Progress Notes (Signed)
Pt transferred to Telemetry bed #1419 per MD order for cardiac monitoring. Reported at bedside to [place]. Nurse aware of pt status, events of day and recent vital signs. BP elevated and receiving RN aware PRN Hydralazine due within the hour. Also made Levada Dy aware pt recently received pain med for lt upper abd pain which has since decreased. Daughter at bedside.

## 2014-03-15 ENCOUNTER — Inpatient Hospital Stay (HOSPITAL_COMMUNITY): Payer: Medicare Other

## 2014-03-15 DIAGNOSIS — K3184 Gastroparesis: Secondary | ICD-10-CM | POA: Insufficient documentation

## 2014-03-15 LAB — GLUCOSE, CAPILLARY
Glucose-Capillary: 102 mg/dL — ABNORMAL HIGH (ref 70–99)
Glucose-Capillary: 127 mg/dL — ABNORMAL HIGH (ref 70–99)
Glucose-Capillary: 48 mg/dL — ABNORMAL LOW (ref 70–99)
Glucose-Capillary: 71 mg/dL (ref 70–99)
Glucose-Capillary: 71 mg/dL (ref 70–99)
Glucose-Capillary: 77 mg/dL (ref 70–99)

## 2014-03-15 MED ORDER — HYDRALAZINE HCL 20 MG/ML IJ SOLN
10.0000 mg | Freq: Once | INTRAMUSCULAR | Status: AC
  Administered 2014-03-15: 10 mg via INTRAVENOUS
  Filled 2014-03-15: qty 1

## 2014-03-15 MED ORDER — DEXTROSE 10 % IV SOLN
INTRAVENOUS | Status: DC
  Administered 2014-03-15 – 2014-03-16 (×2): via INTRAVENOUS
  Filled 2014-03-15 (×5): qty 1000

## 2014-03-15 MED ORDER — DEXTROSE 50 % IV SOLN
25.0000 mL | Freq: Once | INTRAVENOUS | Status: AC
  Administered 2014-03-15: 25 mL via INTRAVENOUS

## 2014-03-15 MED ORDER — DEXTROSE 50 % IV SOLN
INTRAVENOUS | Status: AC
  Filled 2014-03-15: qty 50

## 2014-03-15 NOTE — Progress Notes (Signed)
Patient ID: Shannon [name], female   DOB: 03/03/1924, 80 y.o.   MRN: [id number]  [facility] Progress Note  Subjective: Feels better, slept well, tolerating NG which is not draining much. Says had a little chest pain last night but now saying mostly up under left ribs and feels like gas pressure CXR- cardiomegaly Troponins neg  Objective:  Vital signs in last 24 hours: Temp:  [97.6 F (36.4 C)-98.5 F (36.9 C)] 97.7 F (36.5 C) (12/13 0448) Pulse Rate:  [56-72] 66 (12/13 0448) Resp:  [18-20] 18 (12/13 0448) BP: (163-228)/(56-78) 163/56 mmHg (12/13 0448) SpO2:  [98 %-100 %] 98 % (12/13 0448) Last BM Date: 03/13/14 General:   Alert,  Well-developed, elderly  AA female    in NAD Heart:  Regular rate and rhythm; no murmurs Pulm;clear Abdomen:  Soft, min tender epigastrium,and nondistended. Normal bowel sounds, without guarding, and without rebound.   Extremities:  Without edema. Neurologic:  Alert and  oriented x4;  grossly normal neurologically. Psych:  Alert and cooperative. Normal mood and affect.  Intake/Output from previous day: 12/12 0701 - 12/13 0700 In: 215 [NG/GT:215] Out: 1125 [Urine:700; Emesis/NG output:425] Intake/Output this shift: Total I/O In: -  Out: 150 [Urine:150]  Lab Results:  Recent Labs  03/12/14 1808 03/13/14 0555  WBC 5.5 6.2  HGB 12.0 10.1*  HCT 34.7* 28.8*  PLT 289 240   BMET  Recent Labs  03/12/14 1808 03/13/14 0555  NA 135* 132*  K 4.3 4.3  CL 93* 98  CO2 24 22  GLUCOSE 101* 105*  BUN 18 16  CREATININE 1.26* 1.21*  CALCIUM 11.1* 9.4   LFT  Recent Labs  03/13/14 0555  PROT 6.7  ALBUMIN 3.4*  AST 21  ALT 13  ALKPHOS 60  BILITOT 0.6    Assessment / Plan: #1 80 yo female with gastric bezoar with recurrent epigastric pain, nausea and now intermittent chest pain Etiology is felt to be poor emptying s/p previous gastro jejunostomy which was resected  And converted to jejuno-jejunostomy summer 2015  Secondary to  perforation from anastomotic ulcer. Will reschedule for EGD with DR. Ardis Hughs in am - re-assess and break up debri #2 other problems as outlined in previous notes  Active Problems:   Hypothyroidism   Gastric outlet obstruction   CKD (chronic kidney disease) stage 3, GFR 30-59 ml/min   Accelerated hypertension   Partial gastric outlet obstruction   Abdominal pain     LOS: 3 days   Amy Esterwood  03/15/2014, 9:48 AM   GI ATTENDING  Interval history data reviewed. Agree with interval progress note as above. Patient has NG tube in. Repeat x-ray in progress. Plan for relook endoscopy tomorrow.  Docia Chuck. Geri Seminole., M.D. [facility] Division of Gastroenterology

## 2014-03-15 NOTE — Progress Notes (Signed)
PROGRESS NOTE  Shannon [name] WUJ:[id number] DOB: [birth year] DOA: 03/12/2014 PCP: Oneal Grout, MD  HPI: Glueckert is a 80 y.o. female with remote gastro-jejunostomy for peptic ulcer disease. She unfortunately had a perforation of an anastomotic ulcer at the gastro-jejunal anastomosis in June 2015 and underwent surgery at [facility].Patient was hospitalized here in October 2015 with abdominal pain nausea and vomiting and had endoscopy done twice during that admission with finding of a gastric bezoar.She was discharged home on a pured diet and PPI therapy.She unfortunately present on 12/10 with abd pain/vomiting, CT scan of abdomen showed persistent marked thickening of the pyloric channel and prepyloric antrum and a large filling defect in the stomach measuring 12 x 10 x 15 cm consistent with a bezoar.She was admitted for further evaluation and treatment. GI and Gen Surgery have been consulted  Subjective / 24 H Interval events Feeling better since NG tube in, chest pain yesterday but more epigastric than chest  Assessment/Plan: Active Problems:   Hypothyroidism   Gastric outlet obstruction   CKD (chronic kidney disease) stage 3, GFR 30-59 ml/min   Accelerated hypertension   Partial gastric outlet obstruction   Abdominal pain   Gastric Outlet Obstruction:secondary to Bezoar. Initially some question of perforation at GE junction on CT Scan, but this was ruled out on Barium Esophagogram.  - feeling better after decompression with NG. Not much output.  Chest Pain: suspect secondary to above and reflux. EKG, enzymes negative. Recent outpatient Nuclear stress test on 02/25/14 negative as well.  - pain more epigastric than chest  - improved after NG tube  Uncontrolled HTN: change IV Lopressor ot every q 6 hours,continue prn IV Hydralazine. Will follow and adjust medications accordingly.   Hypothyroidism:continue with IV Levothyroxine.   CKD (chronic kidney disease) stage 3, GFR 30-59  ml/min:creatinine close to usual baseline. Monitor lytes periodically  Mild Hypercalcemia:secondary to dehydration, resolved with IVF. Monitor lytes periodically  Hx of Depression: since NPO-all meds on hold-resume when able  Hx of CVA:non focal exam-resume ASA/Statins when able-currently NPO  Remote hx of Gastrojejunostomy with perforation of anastomotic ulcer in June 2015-s/p revision/laparotomy   Diet: Diet NPO time specified Except for: Sips with Meds Fluids: none  DVT Prophylaxis: SCD  Code Status: Full Code Family Communication: none  Disposition Plan: remain inpatient  Consultants:  GI  Surgery   Procedures:  None    Antibiotics None    Studies  Dg Chest Port 1 View 03/14/2014  1. Query trace left pleural effusion. 2. Cardiomegaly. 3. Aortic atherosclerosis.    Dg Abd Portable 2v 03/14/2014 1. No evidence of obstruction or free air. 2. Greater than expected volume of retained oral contrast within the stomach. Query clinical history of delayed gastric emptying or gastroparesis? 3. The bones appear osteopenic.    Objective  Filed Vitals:   03/15/14 0010 03/15/14 0117 03/15/14 0206 03/15/14 0448  BP: 206/71 186/65 180/64 163/56  Pulse: 72 59 62 66  Temp:    97.7 F (36.5 C)  TempSrc:    Oral  Resp:    18  Height:      Weight:      SpO2:    98%    Intake/Output Summary (Last 24 hours) at 03/15/14 1106 Last data filed at 03/15/14 0800  Gross per 24 hour  Intake    215 ml  Output    925 ml  Net   -710 ml   Filed Weights   03/13/14 0125 03/14/14 0430  Weight: 65  kg (143 lb 4.8 oz) 69 kg (152 lb 1.9 oz)   Exam:  General:  NAD  Cardiovascular: RRR  Respiratory: CTA biL  Abdomen: soft, non tender  MSK: no edema  Neuro: non focal  Data Reviewed: Basic Metabolic Panel:  Recent Labs Lab 03/12/14 1808 03/13/14 0555  NA 135* 132*  K 4.3 4.3  CL 93* 98  CO2 24 22  GLUCOSE 101* 105*  BUN 18 16  CREATININE 1.26* 1.21*  CALCIUM  11.1* 9.4   Liver Function Tests:  Recent Labs Lab 03/12/14 1808 03/13/14 0555  AST 26 21  ALT 16 13  ALKPHOS 77 60  BILITOT 0.7 0.6  PROT 8.3 6.7  ALBUMIN 4.4 3.4*    Recent Labs Lab 03/12/14 1808  LIPASE 49   CBC:  Recent Labs Lab 03/12/14 1808 03/13/14 0555  WBC 5.5 6.2  NEUTROABS 3.7 4.1  HGB 12.0 10.1*  HCT 34.7* 28.8*  MCV 89.0 87.5  PLT 289 240   Cardiac Enzymes:  Recent Labs Lab 03/12/14 2023 03/14/14 0737 03/14/14 1337 03/14/14 1808  TROPONINI <0.30 <0.30 <0.30 <0.30   BNP (last 3 results)  Recent Labs  01/17/14 2116  PROBNP 961.6*   CBG:  Recent Labs Lab 03/14/14 1644 03/14/14 2026 03/14/14 2359 03/15/14 0445 03/15/14 0850  GLUCAP 83 83 79 77 71    Recent Results (from the past 240 hour(s))  Urine culture     Status: None   Collection Time: 03/07/14 11:41 AM  Result Value Ref Range Status   Specimen Description URINE, CLEAN CATCH  Final   Special Requests NONE  Final   Culture  Setup Time   Final    03/07/2014 20:02 Performed at [facility] Count   Final    75,000 COLONIES/ML Performed at [facility]    Culture   Final    Multiple bacterial morphotypes present, none predominant. Suggest appropriate recollection if clinically indicated. Performed at [facility]    Report Status 03/08/2014 FINAL  Final  Urine culture     Status: None   Collection Time: 03/12/14  7:47 PM  Result Value Ref Range Status   Specimen Description URINE, RANDOM  Final   Special Requests NONE  Final   Culture  Setup Time   Final    03/13/2014 01:31 Performed at [facility] Count   Final    50,000 COLONIES/ML Performed at [facility]    Culture   Final    Multiple bacterial morphotypes present, none predominant. Suggest appropriate recollection if clinically indicated. Performed at [facility]    Report Status 03/13/2014 FINAL  Final     Scheduled Meds: .  levothyroxine  87.5 mcg Intravenous Daily  . metoprolol  7.5 mg Intravenous 4 times per day  . pantoprazole (PROTONIX) IV  40 mg Intravenous Q12H   Continuous Infusions:   Time spent: 25 minutes  Pamella Pert, MD [facility] Pager [phone]. If 7 PM - 7 AM, please contact night-coverage at [email or web address], password [facility] 03/15/2014, 11:06 AM  LOS: 3 days

## 2014-03-15 NOTE — Progress Notes (Signed)
Hypoglycemic Event  CBG: 48  Treatment: D50 IV 25 mL  Symptoms: Sweaty and Shaky  Follow-up CBG: Time:1911 CBG Result:127   Possible Reasons for Event: NPO  Comments/MD notified: Gherghe.     Shannon [name], Shannon [name]  Remember to initiate Hypoglycemia Order Set & complete

## 2014-03-15 NOTE — Progress Notes (Signed)
Patient complaining of left upper abdomen tightness and pain. NG tube assessed for placement and advanced due to not in position and no gastric contents in suction tubing. EKG done; NSR. NG tube hooked back up to intermittent wall suction with gastric contents noted in tubing. Patient reports some relief at this time. Dr. Grandville Silos notified of NG tube advanced for placement.

## 2014-03-15 NOTE — Progress Notes (Signed)
Pt blood pressure 187/65.  Made MD aware and gave scheduled lopressor. Checked 30 minutes later bp still 179/60. MD gave order to give 1 time dose of hydralizine.  Will continue to monitor closely.

## 2014-03-16 ENCOUNTER — Ambulatory Visit: Payer: Medicare Other | Admitting: Nurse Practitioner

## 2014-03-16 ENCOUNTER — Encounter (HOSPITAL_COMMUNITY)
Admission: EM | Disposition: A | Discharge status: Other Acute Inpatient Hospital | Payer: Self-pay | Source: Home / Self Care | Attending: Internal Medicine

## 2014-03-16 DIAGNOSIS — R140 Abdominal distension (gaseous): Secondary | ICD-10-CM

## 2014-03-16 DIAGNOSIS — R072 Precordial pain: Secondary | ICD-10-CM

## 2014-03-16 LAB — COMPREHENSIVE METABOLIC PANEL
ALT: 12 U/L (ref 0–35)
AST: 19 U/L (ref 0–37)
Albumin: 3.6 g/dL (ref 3.5–5.2)
Alkaline Phosphatase: 74 U/L (ref 39–117)
Anion gap: 16 — ABNORMAL HIGH (ref 5–15)
BUN: 12 mg/dL (ref 6–23)
CO2: 19 mEq/L (ref 19–32)
Calcium: 10 mg/dL (ref 8.4–10.5)
Chloride: 95 mEq/L — ABNORMAL LOW (ref 96–112)
Creatinine, Ser: 1.04 mg/dL (ref 0.50–1.10)
GFR calc Af Amer: 54 mL/min — ABNORMAL LOW (ref >90)
GFR calc non Af Amer: 47 mL/min — ABNORMAL LOW (ref >90)
Glucose, Bld: 134 mg/dL — ABNORMAL HIGH (ref 70–99)
Potassium: 3.8 mEq/L (ref 3.7–5.3)
Sodium: 130 mEq/L — ABNORMAL LOW (ref 137–147)
Total Bilirubin: 0.6 mg/dL (ref 0.3–1.2)
Total Protein: 7.6 g/dL (ref 6.0–8.3)

## 2014-03-16 LAB — CBC
HCT: 36.8 % (ref 36.0–46.0)
Hemoglobin: 12.7 g/dL (ref 12.0–15.0)
MCH: 30.8 pg (ref 26.0–34.0)
MCHC: 34.5 g/dL (ref 30.0–36.0)
MCV: 89.1 fL (ref 78.0–100.0)
Platelets: 307 10*3/uL (ref 150–400)
RBC: 4.13 MIL/uL (ref 3.87–5.11)
RDW: 15.6 % — ABNORMAL HIGH (ref 11.5–15.5)
WBC: 6.5 10*3/uL (ref 4.0–10.5)

## 2014-03-16 LAB — GLUCOSE, CAPILLARY
Glucose-Capillary: 125 mg/dL — ABNORMAL HIGH (ref 70–99)
Glucose-Capillary: 149 mg/dL — ABNORMAL HIGH (ref 70–99)
Glucose-Capillary: 183 mg/dL — ABNORMAL HIGH (ref 70–99)

## 2014-03-16 LAB — MRSA PCR SCREENING: MRSA by PCR: NEGATIVE

## 2014-03-16 LAB — TROPONIN I
Troponin I: <0.3 ng/mL (ref <0.30)
Troponin I: <0.3 ng/mL (ref <0.30)
Troponin I: <0.3 ng/mL (ref <0.30)

## 2014-03-16 SURGERY — EGD (ESOPHAGOGASTRODUODENOSCOPY)
Anesthesia: Moderate Sedation

## 2014-03-16 MED ORDER — HYDRALAZINE HCL 20 MG/ML IJ SOLN
5.0000 mg | Freq: Once | INTRAMUSCULAR | Status: AC
  Administered 2014-03-16: 5 mg via INTRAVENOUS
  Filled 2014-03-16: qty 1

## 2014-03-16 MED ORDER — CHLORHEXIDINE GLUCONATE 0.12 % MT SOLN
15.0000 mL | Freq: Two times a day (BID) | OROMUCOSAL | Status: DC
  Administered 2014-03-16 – 2014-03-25 (×19): 15 mL via OROMUCOSAL
  Filled 2014-03-16 (×20): qty 15

## 2014-03-16 MED ORDER — LABETALOL HCL 5 MG/ML IV SOLN
5.0000 mg | Freq: Once | INTRAVENOUS | Status: AC
  Administered 2014-03-16: 5 mg via INTRAVENOUS
  Filled 2014-03-16: qty 4

## 2014-03-16 MED ORDER — NITROGLYCERIN IN D5W 200-5 MCG/ML-% IV SOLN
0.0000 ug/min | INTRAVENOUS | Status: DC
  Administered 2014-03-16: 5 ug/min via INTRAVENOUS
  Administered 2014-03-17: 45 ug/min via INTRAVENOUS
  Administered 2014-03-17: 100 ug/min via INTRAVENOUS
  Administered 2014-03-17: 116.667 ug/min via INTRAVENOUS
  Administered 2014-03-18: 45 ug/min via INTRAVENOUS
  Administered 2014-03-19: 50 ug/min via INTRAVENOUS
  Administered 2014-03-19: 30 ug/min via INTRAVENOUS
  Administered 2014-03-20: 50 ug/min via INTRAVENOUS
  Administered 2014-03-21: 35 ug/min via INTRAVENOUS
  Administered 2014-03-22: 30 ug/min via INTRAVENOUS
  Filled 2014-03-16 (×9): qty 250

## 2014-03-16 MED ORDER — VITAMINS A & D EX OINT
TOPICAL_OINTMENT | CUTANEOUS | Status: AC
  Administered 2014-03-17: 19:00:00
  Filled 2014-03-16: qty 5

## 2014-03-16 MED ORDER — CETYLPYRIDINIUM CHLORIDE 0.05 % MT LIQD
7.0000 mL | Freq: Two times a day (BID) | OROMUCOSAL | Status: DC
  Administered 2014-03-16 – 2014-03-25 (×18): 7 mL via OROMUCOSAL

## 2014-03-16 MED ORDER — HYDRALAZINE HCL 20 MG/ML IJ SOLN
5.0000 mg | Freq: Once | INTRAMUSCULAR | Status: AC
  Administered 2014-03-16: 5 mg via INTRAVENOUS

## 2014-03-16 MED ORDER — SODIUM CHLORIDE 0.9 % IV SOLN
INTRAVENOUS | Status: DC | PRN
  Administered 2014-03-19: 10:00:00 via INTRA_ARTERIAL

## 2014-03-16 NOTE — Progress Notes (Signed)
Progress Note   Subjective  No specific complaints, just feels poorly   Objective   Vital signs in last 24 hours: Temp:  [97.7 F (36.5 C)-98.1 F (36.7 C)] 97.7 F (36.5 C) (12/14 0426) Pulse Rate:  [57-78] 65 (12/14 0838) Resp:  [18] 18 (12/14 0426) BP: (170-222)/(54-86) 193/68 mmHg (12/14 0857) SpO2:  [96 %-100 %] 100 % (12/14 0426) Weight:  [138 lb 7.2 oz (62.8 kg)] 138 lb 7.2 oz (62.8 kg) (12/14 0426) Last BM Date: 03/13/14 General:    Pleasant black female in NAD Abdomen:  Soft, nontender and nondistended. NGT to LIS. Neurologic:  Alert and oriented,  grossly normal neurologically. Psych:  Cooperative. Normal mood and affect.  Intake/Output from previous day: 12/13 0701 - 12/14 0700 In: 604.2 [I.V.:574.2; NG/GT:30] Out: 1100 [Urine:950; Emesis/NG output:150] Intake/Output this shift:    Lab Results:  Recent Labs  03/16/14 0432  WBC 6.5  HGB 12.7  HCT 36.8  PLT 307   BMET  Recent Labs  03/16/14 0432  NA 130*  K 3.8  CL 95*  CO2 19  GLUCOSE 134*  BUN 12  CREATININE 1.04  CALCIUM 10.0   LFT  Recent Labs  03/16/14 0432  PROT 7.6  ALBUMIN 3.6  AST 19  ALT 12  ALKPHOS 74  BILITOT 0.6    Studies/Results: Dg Chest Port 1 View  03/14/2014   CLINICAL DATA:  80 year old female with new onset chest pain beginning today  EXAM: PORTABLE CHEST - 1 VIEW  COMPARISON:  None.  FINDINGS: Cardiomegaly. Atherosclerotic calcification present in the transverse aorta. No focal airspace consolidation, pulmonary edema, pneumothorax. a query trace left pleural effusion. No acute osseous abnormality. The left glenohumeral joint osteoarthritis. Mild coarsening of the interstitial markings.  IMPRESSION: 1. Query trace left pleural effusion. 2. Cardiomegaly. 3. Aortic atherosclerosis.   Electronically Signed   By: Jacqulynn Cadet M.D.   On: 03/14/2014 10:12   Dg Abd Portable 1v  03/15/2014   CLINICAL DATA:  Nasogastric tube placement.  EXAM: PORTABLE  ABDOMEN - 1 VIEW  COMPARISON:  03/14/2014  FINDINGS: The nasogastric tube is in place, tip coiled within the stomach and tip overlying the region of the fundus. Surgical clips are noted in the right upper quadrant. There is residual contrast in the colon and distal small bowel following esophagram on 03/13/2014. Bowel gas pattern is nonobstructive.  IMPRESSION: Nasogastric tube coiled within the stomach.   Electronically Signed   By: Shon Hale M.D.   On: 03/15/2014 10:49   Dg Abd Portable 2v  03/14/2014   CLINICAL DATA:  80 year old female with persistent abdominal pain and fullness.  EXAM: PORTABLE ABDOMEN - 2 VIEW  COMPARISON:  Esophagram performed yesterday. CT abdomen/ pelvis 03/12/2014  FINDINGS: No evidence of free air on the lateral decubitus view. Contrast material is noted within the stomach and scattered throughout the large bowel. There is no evidence of obstruction. The volume of contrast material remaining within the stomach is greater than expected. The bones are diffusely osteopenic. Surgical clips in the right upper quadrant suggest prior cholecystectomy.  IMPRESSION: 1. No evidence of obstruction or free air. 2. Greater than expected volume of retained oral contrast within the stomach. Query clinical history of delayed gastric emptying or gastroparesis? 3. The bones appear osteopenic.   Electronically Signed   By: Jacqulynn Cadet M.D.   On: 03/14/2014 10:04     Assessment / Plan:     39.  80 year old  female with complicated surgical GI history. She is s/p remote gastro-jejunostomy (for PUD?). She had an anastomotic ulcer which led to perforation requiring resection (jejunojejun bypass) in June of this year at [facility]. Admitted now with weight loss, upper abdominal pain, early satiety. CTscan reveals probable bezoar, marked thickening of pyloric channel and antrum. Plan was for EGD today but now postponed secondary to #2. NGT in place (bilious). Only 150cc/hr since placement yesterday around  noon.   2. Multiple medical problems. Patient being transferred to SDU for hypertensive urgency.     LOS: 4 days   Tye Savoy  03/16/2014, 9:20 AM    ________________________________________________________________________  Velora Heckler GI MD note:  I personally examined the patient, reviewed the data and agree with the assessment and plan described above.  We will see her in AM to assess for EGD again (if BP issues more stablized).  Thanks    Owens Loffler, MD [facility] Pager [phone]

## 2014-03-16 NOTE — Progress Notes (Signed)
Paged MD and made him aware that pt BP had been elevated throughout the night, and had received various IV pushes, none of which controled the BP.  Pt also started having chest pain, and stated "I just don't feel good".  MD put in order for Nitro drip which was started with charge nurse- Jefferey Pica.  MD put in order to transfer to step down unit.  Pt blood pressure remained elevated with Nitro drip but unable to titrate until pt on step down floor.  Pt transferred to step down unit as soon as possible.  Report given to Cristie Hem, RN.

## 2014-03-16 NOTE — Procedures (Signed)
Arterial Catheter Insertion Procedure Note Shannon [name] [id number] 12/31/1923  Procedure: Insertion of Arterial Catheter  Indications: Blood pressure monitoring  Procedure Details Consent: Risks of procedure as well as the alternatives and risks of each were explained to the (patient/caregiver).  Consent for procedure obtained. Time Out: Verified patient identification, verified procedure, site/side was marked, verified correct patient position, special equipment/implants available, medications/allergies/relevent history reviewed, required imaging and test results available.  Performed  Maximum sterile technique was used including antiseptics, cap, gloves, gown, hand hygiene, mask and sheet. Skin prep: Chlorhexidine; local anesthetic administered 20 gauge catheter was inserted into left radial artery using the Seldinger technique.  Evaluation Blood flow good; BP tracing good. Complications: No apparent complications     Shannon [name] 03/16/2014

## 2014-03-16 NOTE — Progress Notes (Signed)
Pt with progressively elevating BP throughout the night. Spoke with PA on call multiple times and gave various IV pushes per her orders in attempts to control BP. Pt stated throughout the night that she "just didn't feel good" but could not give more descriptive symptoms. This information was also given to the PA on call when calling to inform of BP values. Rounding MD is now at bedside and dayshift RN will take over pt care at this time.

## 2014-03-16 NOTE — Consult Note (Signed)
Patient ID: Shannon [name] MRN: [id number] DOB/AGE: January 26, 1924 80 y.o.  Admit date: 03/12/2014 Referring Physician: Elvera Lennox Primary Cardiologist: Delton See Reason for Consultation: Chest pain, HTN urgency  HPI: 80 yo female with history of HTN, HLD, DM, stage 3 CKD, gastric outlet obstruction admitted with abdominal pain in the epigastrium, nausea, vomiting. She was admitted in October 2015 for gastric outlet obstruction and at that time patient had EGD which showed bezoar which was pulverized. She has a chronic anastomotic ulcer at a Gastro jejunal anastomosis with perforation in June 2015. She has undergone resection and creation of a jejunal jejunal anastomosis at [facility]. She was seen by cardiology during the admission in October 2015 secondary to elevated BP, chest pain and mildly elevated troponin felt to be due to demand ischemia in setting of elevated BP. She was seen in our office 02/18/14 by Dr. Delton See and her chest pain had resolved. Stress myoview 02/24/14 with no evidence of ischemia. LV function was normal. Readmitted 03/13/14 with recurrent abdominal pain/N/V. CT abdomen and pelvis shows partial gastric outlet obstruction with recurrence of the Bezoar. She was seen by General surgery but no surgical intervention was indicated. She has been followed by GI and plans are in place for upper endoscopy this am. During this admission, episodes of epigastric pain. Troponins have been negative. Recurrent chest pain this am with systolic BP over 956. EKG this am with no ischemic changes.   Currently c/o continuous epigastric pain with radiation under the left breast. No SOB. States that she does not feel well. No nausea this am. No dizziness. The pain seems to worsen with deep inspiration. She is now on a NTG drip and is being transferred to the step down ICU for titration of meds.   Review of systems complete and found to be negative unless listed above   Past Medical History    Diagnosis Date  . Pneumonia   . Thyroid disease   . Seizures   . Stroke   . Hyperlipidemia   . Hypertension   . Diabetes mellitus without complication   . Small bowel obstruction   . Ventral hernia with bowel obstruction     Family History  Problem Relation Age of Onset  . Stroke Mother   . Heart attack Neg Hx   . Hypertension Mother   . Diabetes Brother     History   Social History  . Marital Status: Married    Spouse Name: N/A    Number of Children: N/A  . Years of Education: N/A   Occupational History  . Not on file.   Social History Main Topics  . Smoking status: Never Smoker   . Smokeless tobacco: Not on file  . Alcohol Use: No  . Drug Use: No  . Sexual Activity: Not on file   Other Topics Concern  . Not on file   Social History Narrative   Married in [place], lives in one story house with 2 people and no pets   Occupation: ?   Has a living Will, [place], and doesn't have DNR   Was living with Husband (in frail health) in [place] until 09/2013.  After her [facility] admission they both came to live with daughter in [place]    Past Surgical History  Procedure Laterality Date  . Exploratory laparotomy  09-07-2013  . Cholecystectomy    . Tubal ligation    . Colectomy    . Hernia repair  2015  . Esophagogastroduodenoscopy N/A  01/19/2014    Procedure: ESOPHAGOGASTRODUODENOSCOPY (EGD);  Surgeon: Hilarie Fredrickson, MD;  Location: [facility];  Service: Endoscopy;  Laterality: N/A;  . Esophagogastroduodenoscopy N/A 01/20/2014    Procedure: ESOPHAGOGASTRODUODENOSCOPY (EGD);  Surgeon: Hilarie Fredrickson, MD;  Location: [facility];  Service: Endoscopy;  Laterality: N/A;    No Known Allergies   Current Meds:  . levothyroxine  87.5 mcg Intravenous Daily  . metoprolol  7.5 mg Intravenous 4 times per day  . pantoprazole (PROTONIX) IV  40 mg Intravenous Q12H     Physical Exam: Blood pressure 194/72, pulse 65, temperature 97.7 F (36.5 C), temperature source Oral, resp.  rate 18, height 5\' 6"  (1.676 m), weight 138 lb 7.2 oz (62.8 kg), SpO2 100 %.    General: Well developed, well nourished, NAD  HEENT: OP clear, mucus membranes moist  SKIN: warm, dry. No rashes.  Neuro: No focal deficits  Musculoskeletal: Muscle strength 5/5 all ext  Psychiatric: Mood and affect normal  Neck: No JVD, no carotid bruits, no thyromegaly, no lymphadenopathy.  Lungs:Clear bilaterally, no wheezes, rhonci, crackles  Cardiovascular: Regular rate and rhythm. No murmurs, gallops or rubs.  Abdomen:Soft. Bowel sounds present. Non-tender.  Extremities: No lower extremity edema. Pulses are 2 + in the bilateral DP/PT.  Labs:   Lab Results  Component Value Date   WBC 6.5 03/16/2014   HGB 12.7 03/16/2014   HCT 36.8 03/16/2014   MCV 89.1 03/16/2014   PLT 307 03/16/2014    Recent Labs Lab 03/16/14 0432  NA 130*  K 3.8  CL 95*  CO2 19  BUN 12  CREATININE 1.04  CALCIUM 10.0  PROT 7.6  BILITOT 0.6  ALKPHOS 74  ALT 12  AST 19  GLUCOSE 134*   Lab Results  Component Value Date   TROPONINI <0.30 03/16/2014    Stress myoview 02/24/14:  QPS Raw Data Images: Normal; no motion artifact; normal heart/lung ratio. Stress Images: There is mild apical thinning with normal uptake in other regions. Rest Images: There is mild apical thinning with normal uptake in other regions. Subtraction (SDS): No evidence of ischemia. Transient Ischemic Dilatation (Normal <1.22): 1.08 Lung/Heart Ratio (Normal <0.45): 0.38 Quantitative Gated Spect Images QGS EDV: 78 ml QGS ESV: 28 ml Impression Exercise Capacity: Lexiscan with no exercise. BP Response: Normal blood pressure response. Clinical Symptoms: Typical symptoms with Lexiscan.  ECG Impression: No significant ST segment change suggestive of ischemia. Comparison with Prior Nuclear Study: No previous nuclear study performed Overall Impression: Low risk stress nuclear study with no ischemia. . LV Ejection Fraction: 64%. LV  Wall Motion: NL LV Function; NL Wall Motion   Chest x-ray: 03/14/14: 1. Query trace left pleural effusion. 2. Cardiomegaly. 3. Aortic atherosclerosis.  EKG: NSR, no ischemic changes  ASSESSMENT AND PLAN:   1. Chest pain: Her chest pain is atypical for angina although there could be cardiac chest pain related to her severely elevated BP. No objective evidence of ischemia. Troponin negative this am. EKG without ischemic changes. Agree with transfer to stepdown unit for titration of medications in attempt to better control BP. She is currently on a NTG drip. This will be titrated once she is in the stepdown unit. Continue beta blocker.   2. Hypertensive Urgency: As above, being moved to SDU by primary team for management of elevated BP with IV medications.   Signed: Verne Carrow, MD 03/16/2014, 8:44 AM

## 2014-03-16 NOTE — Progress Notes (Signed)
PROGRESS NOTE  Shannon [name] ZOX:[id number] DOB: 1923/04/08 DOA: 03/12/2014 PCP: Oneal Grout, MD  HPI: Farrior is a 80 y.o. female with remote gastro-jejunostomy for peptic ulcer disease. She unfortunately had a perforation of an anastomotic ulcer at the gastro-jejunal anastomosis in June 2015 and underwent surgery at [facility].Patient was hospitalized here in October 2015 with abdominal pain nausea and vomiting and had endoscopy done twice during that admission with finding of a gastric bezoar.She was discharged home on a pured diet and PPI therapy.She unfortunately present on 12/10 with abd pain/vomiting, CT scan of abdomen showed persistent marked thickening of the pyloric channel and prepyloric antrum and a large filling defect in the stomach measuring 12 x 10 x 15 cm consistent with a bezoar.She was admitted for further evaluation and treatment. GI and Gen Surgery have been consulted  Subjective / 24 H Interval events Persistently hypertensive overnight > 200 systolic, with chest pain this morning  Assessment/Plan: Active Problems:   Hypothyroidism   Gastric outlet obstruction   CKD (chronic kidney disease) stage 3, GFR 30-59 ml/min   Accelerated hypertension   Partial gastric outlet obstruction   Abdominal pain   Gastroparesis   Hypertensive emergency - transfer to SDU this morning - obtain EKG, cycle cardiac enzymes, nitro gtt with goal blood pressure 160 systolic. - cardiology consult.  - patient NPO due to GOO as below  Gastric Outlet Obstruction:secondary to Bezoar. Initially some question of perforation at GE junction on CT Scan, but this was ruled out on Barium Esophagogram.  - GI Plans endoscopy today, recommend to wait until blood pressure better and chest pain improving  Chest Pain: one episode 2 days ago, suspected secondary to above and reflux. EKG, enzymes negative. Recent outpatient Nuclear stress test on 02/25/14 negative as well.  - pain more epigastric  than chest at that time improved with NG tube - recurrent chest pain this morning, different, pressure-like, obtain EKG, cycle CEs  Uncontrolled HTN: change IV Lopressor ot every q 6 hours,continue prn IV Hydralazine. Will follow and adjust medications accordingly.  - needing gtt this morning, SDU transfer   Hypothyroidism:continue with IV Levothyroxine.   CKD (chronic kidney disease) stage 3, GFR 30-59 ml/min:creatinine close to usual baseline. Monitor lytes periodically  Mild Hypercalcemia:secondary to dehydration, resolved with IVF. Monitor lytes periodically  Hx of Depression: since NPO-all meds on hold-resume when able  Hx of CVA:non focal exam-resume ASA/Statins when able-currently NPO  Remote hx of Gastrojejunostomy with perforation of anastomotic ulcer in June 2015-s/p revision/laparotomy   Diet: Diet NPO time specified Except for: Sips with Meds Fluids: none  DVT Prophylaxis: SCD  Code Status: Full Code Family Communication: daughter over the phone Disposition Plan: remain inpatient  Consultants:  GI   Surgery   Cardiology   Procedures:  None    Antibiotics None    Studies  Dg Chest Port 1 View 03/14/2014  1. Query trace left pleural effusion. 2. Cardiomegaly. 3. Aortic atherosclerosis.    Dg Abd Portable 2v 03/14/2014 1. No evidence of obstruction or free air. 2. Greater than expected volume of retained oral contrast within the stomach. Query clinical history of delayed gastric emptying or gastroparesis? 3. The bones appear osteopenic.    Objective  Filed Vitals:   03/16/14 0140 03/16/14 0426 03/16/14 0602 03/16/14 0740  BP: 209/86 214/77 222/78 215/77  Pulse: 74 75 74   Temp:  97.7 F (36.5 C)    TempSrc:  Oral    Resp:  18  Height:      Weight:  62.8 kg (138 lb 7.2 oz)    SpO2:  100%      Intake/Output Summary (Last 24 hours) at 03/16/14 0750 Last data filed at 03/16/14 1610  Gross per 24 hour  Intake 604.16 ml  Output   1100 ml    Net -495.84 ml   Filed Weights   03/13/14 0125 03/14/14 0430 03/16/14 0426  Weight: 65 kg (143 lb 4.8 oz) 69 kg (152 lb 1.9 oz) 62.8 kg (138 lb 7.2 oz)   Exam:  General:  NAD  Cardiovascular: RRR  Respiratory: CTA biL  Abdomen: soft, non tender  MSK: no edema  Neuro: non focal  Data Reviewed: Basic Metabolic Panel:  Recent Labs Lab 03/12/14 1808 03/13/14 0555 03/16/14 0432  NA 135* 132* 130*  K 4.3 4.3 3.8  CL 93* 98 95*  CO2 24 22 19   GLUCOSE 101* 105* 134*  BUN 18 16 12   CREATININE 1.26* 1.21* 1.04  CALCIUM 11.1* 9.4 10.0   Liver Function Tests:  Recent Labs Lab 03/12/14 1808 03/13/14 0555 03/16/14 0432  AST 26 21 19   ALT 16 13 12   ALKPHOS 77 60 74  BILITOT 0.7 0.6 0.6  PROT 8.3 6.7 7.6  ALBUMIN 4.4 3.4* 3.6    Recent Labs Lab 03/12/14 1808  LIPASE 49   CBC:  Recent Labs Lab 03/12/14 1808 03/13/14 0555 03/16/14 0432  WBC 5.5 6.2 6.5  NEUTROABS 3.7 4.1  --   HGB 12.0 10.1* 12.7  HCT 34.7* 28.8* 36.8  MCV 89.0 87.5 89.1  PLT 289 240 307   Cardiac Enzymes:  Recent Labs Lab 03/12/14 2023 03/14/14 0737 03/14/14 1337 03/14/14 1808  TROPONINI <0.30 <0.30 <0.30 <0.30   BNP (last 3 results)  Recent Labs  01/17/14 2116  PROBNP 961.6*   CBG:  Recent Labs Lab 03/15/14 1231 03/15/14 1828 03/15/14 1911 03/15/14 2342 03/16/14 0422  GLUCAP 71 48* 127* 102* 125*    Recent Results (from the past 240 hour(s))  Urine culture     Status: None   Collection Time: 03/07/14 11:41 AM  Result Value Ref Range Status   Specimen Description URINE, CLEAN CATCH  Final   Special Requests NONE  Final   Culture  Setup Time   Final    03/07/2014 20:02 Performed at [facility] Count   Final    75,000 COLONIES/ML Performed at [facility]    Culture   Final    Multiple bacterial morphotypes present, none predominant. Suggest appropriate recollection if clinically indicated. Performed at [facility]     Report Status 03/08/2014 FINAL  Final  Urine culture     Status: None   Collection Time: 03/12/14  7:47 PM  Result Value Ref Range Status   Specimen Description URINE, RANDOM  Final   Special Requests NONE  Final   Culture  Setup Time   Final    03/13/2014 01:31 Performed at [facility] Count   Final    50,000 COLONIES/ML Performed at [facility]    Culture   Final    Multiple bacterial morphotypes present, none predominant. Suggest appropriate recollection if clinically indicated. Performed at [facility]    Report Status 03/13/2014 FINAL  Final     Scheduled Meds: . levothyroxine  87.5 mcg Intravenous Daily  . metoprolol  7.5 mg Intravenous 4 times per day  . pantoprazole (PROTONIX) IV  40 mg Intravenous Q12H   Continuous Infusions: . dextrose 10 % 1,000 mL infusion 50 mL/hr at 03/15/14 1923  . nitroGLYCERIN      Time spent: 35 minutes  Pamella Pert, MD [facility] Pager [phone]. If 7 PM - 7 AM, please contact night-coverage at [email or web address], password [email or web address] 03/16/2014, 7:50 AM  LOS: 4 days

## 2014-03-17 ENCOUNTER — Encounter (HOSPITAL_COMMUNITY): Payer: Self-pay | Admitting: Anesthesiology

## 2014-03-17 LAB — GLUCOSE, CAPILLARY
Glucose-Capillary: 164 mg/dL — ABNORMAL HIGH (ref 70–99)
Glucose-Capillary: 163 mg/dL — ABNORMAL HIGH (ref 70–99)
Glucose-Capillary: 151 mg/dL — ABNORMAL HIGH (ref 70–99)
Glucose-Capillary: 157 mg/dL — ABNORMAL HIGH (ref 70–99)
Glucose-Capillary: 169 mg/dL — ABNORMAL HIGH (ref 70–99)
Glucose-Capillary: 121 mg/dL — ABNORMAL HIGH (ref 70–99)
Glucose-Capillary: 120 mg/dL — ABNORMAL HIGH (ref 70–99)

## 2014-03-17 LAB — BASIC METABOLIC PANEL
Anion gap: 13 (ref 5–15)
BUN: 9 mg/dL (ref 6–23)
CO2: 20 mEq/L (ref 19–32)
Calcium: 9.3 mg/dL (ref 8.4–10.5)
Chloride: 92 mEq/L — ABNORMAL LOW (ref 96–112)
Creatinine, Ser: 0.97 mg/dL (ref 0.50–1.10)
GFR calc Af Amer: 59 mL/min — ABNORMAL LOW (ref >90)
GFR calc non Af Amer: 51 mL/min — ABNORMAL LOW (ref >90)
Glucose, Bld: 179 mg/dL — ABNORMAL HIGH (ref 70–99)
Potassium: 3.5 mEq/L — ABNORMAL LOW (ref 3.7–5.3)
Sodium: 125 mEq/L — ABNORMAL LOW (ref 137–147)

## 2014-03-17 LAB — CBC
HCT: 28.7 % — ABNORMAL LOW (ref 36.0–46.0)
Hemoglobin: 10.1 g/dL — ABNORMAL LOW (ref 12.0–15.0)
MCH: 30.6 pg (ref 26.0–34.0)
MCHC: 35.2 g/dL (ref 30.0–36.0)
MCV: 87 fL (ref 78.0–100.0)
Platelets: 298 10*3/uL (ref 150–400)
RBC: 3.3 MIL/uL — ABNORMAL LOW (ref 3.87–5.11)
RDW: 15.4 % (ref 11.5–15.5)
WBC: 6.1 10*3/uL (ref 4.0–10.5)

## 2014-03-17 MED ORDER — LIDOCAINE VISCOUS 2 % MT SOLN
15.0000 mL | Freq: Four times a day (QID) | OROMUCOSAL | Status: DC | PRN
  Administered 2014-03-17: 15 mL via OROMUCOSAL
  Filled 2014-03-17 (×2): qty 15

## 2014-03-17 MED ORDER — SODIUM CHLORIDE 0.9 % IV SOLN
INTRAVENOUS | Status: DC
  Administered 2014-03-17 – 2014-03-20 (×6): via INTRAVENOUS
  Filled 2014-03-17 (×7): qty 1000

## 2014-03-17 MED ORDER — HYDRALAZINE HCL 20 MG/ML IJ SOLN
5.0000 mg | Freq: Four times a day (QID) | INTRAMUSCULAR | Status: DC
  Administered 2014-03-17 – 2014-03-18 (×4): 5 mg via INTRAVENOUS
  Filled 2014-03-17 (×4): qty 1

## 2014-03-17 NOTE — Progress Notes (Signed)
    Subjective:  Complaining of left upper quadrant/epigastric discomfort this morning. No chest pain. No shortness of breath.  Objective:  Vital Signs in the last 24 hours: Temp:  [98 F (36.7 C)-98.8 F (37.1 C)] 98.8 F (37.1 C) (12/15 0000) Pulse Rate:  [58-81] 71 (12/15 0700) Resp:  [12-25] 15 (12/15 0700) BP: (97-231)/(45-158) 147/56 mmHg (12/15 0700) SpO2:  [94 %-100 %] 97 % (12/15 0700)  Intake/Output from previous day: 12/14 0701 - 12/15 0700 In: 538.1 [I.V.:538.1] Out: 450 [Urine:350; Emesis/NG output:100]  Physical Exam: Pt is alert and oriented, pleasant elderly woman in NAD HEENT: normal Neck: JVP - normal Lungs: CTA bilaterally CV: RRR without murmur or gallop Abd: soft, positive bowel sounds Ext: no C/C/E Skin: warm/dry no rash   Lab Results:  Recent Labs  03/16/14 0432 03/17/14 0418  WBC 6.5 6.1  HGB 12.7 10.1*  PLT 307 298    Recent Labs  03/16/14 0432 03/17/14 0418  NA 130* 125*  K 3.8 3.5*  CL 95* 92*  CO2 19 20  GLUCOSE 134* 179*  BUN 12 9  CREATININE 1.04 0.97    Recent Labs  03/16/14 1405 03/16/14 2100  TROPONINI <0.30 <0.30    Cardiac Studies: 2-D echocardiogram 12/24/2013: Study Conclusions  - Left ventricle: The cavity size was mildly dilated. Wall thickness was increased in a pattern of mild LVH. Systolic function was normal. The estimated ejection fraction was in the range of 55% to 60%. - Aortic valve: There was mild to moderate regurgitation. - Mitral valve: There was mild regurgitation. - Left atrium: The atrium was moderately dilated. - Right atrium: The atrium was mildly dilated. - Atrial septum: Mobile and redundant with no obvious PFO.  Myoview scan 02/24/2014: Impression Exercise Capacity: Lexiscan with no exercise. BP Response: Normal blood pressure response. Clinical Symptoms: Typical symptoms with Lexiscan.  ECG Impression: No significant ST segment change suggestive of  ischemia. Comparison with Prior Nuclear Study: No previous nuclear study performed  Overall Impression: Low risk stress nuclear study with no ischemia. .  LV Ejection Fraction: 64%. LV Wall Motion: NL LV Function; NL Wall Motion  Tele: Personally reviewed, normal sinus rhythm.  Assessment/Plan:  1. Hypertensive Emergency: Blood pressure has stabilized on a combination of IV metoprolol and an IV nitroglycerin drip. She remains nothing by mouth. I would continue her current treatment until she is able to take oral medication, at which time she can be transitioned back to her by mouth medication.  2. Chest pain: secondary to #1. Recent Stress Nuclear Scan negative for ischemia. Continue with treatment of malignant HTN. Troponin negative.   Appears stable from a cardiac perspective. No further workup indicated at this time. Please feel free to call back if any help needed with her antihypertensive therapy. She was on oral hydralazine as an outpatient, and if she develops refractory malignant hypertension despite current IV treatments, scheduled IV hydralazine could be used.  Sherren Mocha, M.D. 03/17/2014, 7:07 AM

## 2014-03-17 NOTE — Progress Notes (Signed)
Paula from GI called and said Anesthesia was not comfortable sedating patient due to lab results and age. Advised to D/C NG tube and watch over night for any nausea or vomitting. Also stated can have sips after 4 hrs of NG tube being removed. Will follow-up with patient tomorrow.

## 2014-03-17 NOTE — Progress Notes (Signed)
PROGRESS NOTE  Shannon [name] MVH:[id number] DOB: October 11, 1923 DOA: 03/12/2014 PCP: Oneal Grout, MD   Brief history 80 y.o. female with remote gastro-jejunostomy for peptic ulcer disease. She unfortunately had a perforation of an anastomotic ulcer at the gastro-jejunal anastomosis in June 2015 and underwent surgery at [facility].Patient was hospitalized here in October 2015 with abdominal pain nausea and vomiting and had endoscopy done twice during that admission with finding of a gastric bezoar.She was discharged home on a pured diet and PPI therapy.She unfortunately present on 12/10 with abd pain/vomiting, CT scan of abdomen showed persistent marked thickening of the pyloric channel and prepyloric antrum and a large filling defect in the stomach measuring 12 x 10 x 15 cm consistent with a bezoar.She was admitted for further evaluation and treatment. GI and Gen Surgery have been consulted.  Surgery did not feel that there was any indication for surgical intervention at this time. GI plans EGD when the patient is stable medically, hopefully 03/18/2014 Assessment/Plan: Gastric Outlet Obstruction: -secondary to Bezoar.  -Initially some question of perforation at GE junction on CT Scan, but this was ruled out on Barium Esophagogram.  -continues to feel floated/belching-having chest pain-suspect from upward pressure from outlet obstruction- -NGT for decompression--removed 03/17/14-->clears -GI following, EGD hopefully on 03/18/14 HTN Urgency -Patient was transferred to SDU on 03/16/14 and started on NTG drip -Restart hydralazine--patient was taking this at home-- will keep it IV for now as the patient has had problems with by mouth intake -Wean nitroglycerin drip to off for SBP <180, DBP 110 -Continue IV metoprolol for now until the patient is reliably able to take po ATypical Chest pain  -Troponins negative 3  -EKG with nonspecific changes  Diabetes mellitus type 2 -12/24/2013  hemoglobin A1c 6.9 -Given the patient's age and current recommendations, will allow for lobar SEMEN control -Discontinue NovoLog sliding scale -Discontinue CBG checks--she has not had any hypoglycemia during the entire hospitalization  hyponatremia  -Secondary to hypotonic fluid  -Discontinue D10  -Start normal saline  CKD stage 3 -baseline creatinine 0.9-1.3 hypokalemia -Replete  -Check magnesium Hypothyroidism: -continue with IV Levothyroxine.  Hx CVA ASA Suppositories until able to take po Depression:  -since NPO-all meds on hold-resume when able Remote hx of Gastrojejunostomy with perforation of anastomotic ulcer in June 2015-s/p revision/laparotomy   Family Communication:   Pt at beside Disposition Plan:   Home when medically stable      Procedures/Studies: Ct Abdomen Pelvis Wo Contrast  03/12/2014   CLINICAL DATA:  Constant vomiting today, constipated since Saturday, RIGHT lower quadrant rectal pain, history diabetes, hypertension, prior gastric bezoar  EXAM: CT ABDOMEN AND PELVIS WITHOUT CONTRAST  TECHNIQUE: Multidetector CT imaging of the abdomen and pelvis was performed following the standard protocol without IV contrast. Sagittal and coronal MPR images reconstructed from axial data set.  COMPARISON:  03/07/2014  FINDINGS: Minimal bibasilar atelectasis.  Low-attenuation foci within liver, question cysts, unchanged.  Metallic foreign body at LEFT ventricle again identified, uncertain etiology.  Gallbladder surgically absent.  Probable tiny LEFT renal cyst 9 mm diameter.  Within limits of a nonenhanced exam, remainder of liver, spleen, pancreas, kidneys and adrenal glands normal appearance.  Persistent marked thickening of the pyloric channel and pre pyloric antrum gastric distention question partial gastric outlet obstruction.  Small amount of ingested contrast is present within proximal small bowel loops.  Large filling defect within the stomach, 12.2 x 10.0 x 15.2 cm,  containing bubbly gas,  consistent with recurrent bezoar.  Scattered colonic diverticula.  No evidence of bowel obstruction or dilatation.  Extensive atherosclerotic calcifications aorta and minimally in coronary arteries.  High attenuation nodule 13 mm diameter again identified adjacent to the thickened gastroesophageal junction, unchanged but since 03/07/2014 but new since 01/17/2014, question contrast within epiphrenic diverticulum or small focal contained perforation, unlikely calcified lymph node since this is new in past 2 months.  Unremarkable bladder, ureters, uterus and adnexae.  Appendix not visualized.  No definite rectal wall thickening.  No additional mass, adenopathy, free fluid, free air or abscess.  Bones demineralized with scattered degenerative changes.  Surgical scar at ventral abdomen with a small incisional hernia containing a portion of transverse colon.  IMPRESSION: Thickened pylorus and pre-pyloric antrum likely causing partial outlet obstruction.  Large filling defect within stomach consistent with recurrent bezoar 12.2 x 10.0 x 15.2 cm.  Colonic diverticulosis.  Suspected hepatic cysts unchanged.  13 mm high attenuation nodule adjacent to thickened gastroesophageal junction, question contained perforation versus epiphrenic diverticulum containing contrast, new since 01/17/2014 but unchanged since 03/07/2014.  Small incisional hernia containing a portion of the transverse colon wall without obstruction.   Electronically Signed   By: Ulyses Southward M.D.   On: 03/12/2014 23:12   Ct Abdomen Pelvis W Contrast  03/07/2014   CLINICAL DATA:  80 year old female presenting with rectal pressure gradually worsening over the prior month.  EXAM: CT ABDOMEN AND PELVIS WITH CONTRAST  TECHNIQUE: Multidetector CT imaging of the abdomen and pelvis was performed using the standard protocol following bolus administration of intravenous contrast.  CONTRAST:  50mL OMNIPAQUE IOHEXOL 300 MG/ML SOLN, 80mL OMNIPAQUE  IOHEXOL 300 MG/ML SOLN  COMPARISON:  CT of the abdomen and pelvis 01/17/2014.  FINDINGS: Lower chest: Cardiomegaly with biatrial dilatation. Calcifications of the aortic valve. Atherosclerotic calcifications in the left main and left circumflex coronary arteries. Small hiatal hernia.  Hepatobiliary: Multifocal low-attenuation lesions appear unchanged in size, number and distribution scattered throughout the liver. Many these are too small to definitively characterize, while the larger lesions are intermediate attenuation, favored to represent proteinaceous cysts. The largest of these lesions measures only 12 mm in segment 2 (image 21 of series 2). No new hepatic lesions are noted. No intra or extrahepatic biliary ductal dilatation. Status post cholecystectomy.  Pancreas: Unremarkable.  Spleen: Unremarkable.  Adrenals/Urinary Tract: Again noted is a 1.2 cm intermediate attenuation (23 HU) lesion in the lower pole of the left kidney, with some surrounding architectural distortion, favored to represent a previously ruptured proteinaceous cyst. No suspicious renal lesions are noted. Multifocal cortical thinning in the kidneys bilaterally, presumably mild scarring. No hydroureteronephrosis. Urinary bladder is normal in appearance. Bilateral adrenal glands are normal in appearance.  Stomach/Bowel: As with the prior examination there is extensive thickening of the gastric wall in the antral pre-pyloric region of the stomach. The pylorus is also markedly thickened (up to 15 mm in thickness). The stomach is moderately distended. No pathologic dilatation of small bowel or colon. Postoperative changes of partial colectomy near the rectosigmoid junction are noted. A few scattered colonic diverticulae are noted, without surrounding inflammatory changes to suggest an acute diverticulitis at this time. The distal rectum and anus are grossly unremarkable in appearance.  Vascular/Lymphatic: Extensive atherosclerosis throughout the  abdominal and pelvic vasculature, without evidence of aneurysm or dissection. No lymphadenopathy noted in the abdomen or pelvis.  Reproductive: Retroverted uterus.  Ovaries are atrophic.  Other: No significant volume of ascites.  No pneumoperitoneum.  Musculoskeletal: Tiny ventral  hernia, through which there is incomplete herniation of the mid transverse colon; no findings to suggest associated bowel incarceration or obstruction at this time. Old compression fracture of superior endplate of T11 with approximately 20% loss of anterior vertebral body height (unchanged). There are no aggressive appearing lytic or blastic lesions noted in the visualized portions of the skeleton.  IMPRESSION: 1. No findings to account for the patient's worsening rectal pressure. 2. There is persistent thickening of the pylorus and antral pre-pyloric region of the stomach, which could be inflammatory in the setting of ulcer disease, or could be neoplastic. There is persistent moderate dilatation of the stomach, suggesting some degree of gastric outlet obstruction. Correlation with results of recent upper endoscopy is suggested. 3. Additional incidental findings, as above, similar to prior examination.   Electronically Signed   By: Trudie Reed M.D.   On: 03/07/2014 16:28   Dg Chest Port 1 View  03/14/2014   CLINICAL DATA:  80 year old female with new onset chest pain beginning today  EXAM: PORTABLE CHEST - 1 VIEW  COMPARISON:  None.  FINDINGS: Cardiomegaly. Atherosclerotic calcification present in the transverse aorta. No focal airspace consolidation, pulmonary edema, pneumothorax. a query trace left pleural effusion. No acute osseous abnormality. The left glenohumeral joint osteoarthritis. Mild coarsening of the interstitial markings.  IMPRESSION: 1. Query trace left pleural effusion. 2. Cardiomegaly. 3. Aortic atherosclerosis.   Electronically Signed   By: Malachy Moan M.D.   On: 03/14/2014 10:12   Dg Abd Portable  1v  03/15/2014   CLINICAL DATA:  Nasogastric tube placement.  EXAM: PORTABLE ABDOMEN - 1 VIEW  COMPARISON:  03/14/2014  FINDINGS: The nasogastric tube is in place, tip coiled within the stomach and tip overlying the region of the fundus. Surgical clips are noted in the right upper quadrant. There is residual contrast in the colon and distal small bowel following esophagram on 03/13/2014. Bowel gas pattern is nonobstructive.  IMPRESSION: Nasogastric tube coiled within the stomach.   Electronically Signed   By: Rosalie Gums M.D.   On: 03/15/2014 10:49   Dg Abd Portable 2v  03/14/2014   CLINICAL DATA:  80 year old female with persistent abdominal pain and fullness.  EXAM: PORTABLE ABDOMEN - 2 VIEW  COMPARISON:  Esophagram performed yesterday. CT abdomen/ pelvis 03/12/2014  FINDINGS: No evidence of free air on the lateral decubitus view. Contrast material is noted within the stomach and scattered throughout the large bowel. There is no evidence of obstruction. The volume of contrast material remaining within the stomach is greater than expected. The bones are diffusely osteopenic. Surgical clips in the right upper quadrant suggest prior cholecystectomy.  IMPRESSION: 1. No evidence of obstruction or free air. 2. Greater than expected volume of retained oral contrast within the stomach. Query clinical history of delayed gastric emptying or gastroparesis? 3. The bones appear osteopenic.   Electronically Signed   By: Malachy Moan M.D.   On: 03/14/2014 10:04   Dg Esophagus W/water Sol Cm  03/13/2014   CLINICAL DATA:  Abnormal CT, evaluate for esophageal perforation  EXAM: ESOPHOGRAM/BARIUM SWALLOW  TECHNIQUE: Single contrast examination was performed using water-soluble contrast.  FLUOROSCOPY TIME:  1 min 7 seconds  COMPARISON:  CT abdomen pelvis dated 03/12/2014 and 03/07/2014  FINDINGS: Esophageal dysmotility.  No fixed esophageal narrowing/stricture.  Suspected small paraesophageal hernia (less likely hiatal  hernia or esophageal diverticulum) at the GE junction. The appearance does not suggest a contained esophageal perforation.  Presence of gastroesophageal reflux could not be assessed due  to the patient's inability to clear her esophagus.  Moderate food/debris in the stomach.  IMPRESSION: Esophageal dysmotility.  No fixed esophageal narrowing/stricture.  Suspected small paraesophageal hernia at the GE junction. No evidence of contained esophageal perforation.  Moderate food/debris in the stomach.   Electronically Signed   By: Charline Bills M.D.   On: 03/13/2014 09:44         Subjective: Patient denies fevers, chills, headache, chest pain, dyspnea, nausea, vomiting, diarrhea, dysuria, hematuria. She complains of some abdominal pain in the epigastric region and left upper quadrant without any vomiting today. No dysuria or hematuria. She is passing flatus.   Objective: Filed Vitals:   03/17/14 0900 03/17/14 0930 03/17/14 1000 03/17/14 1200  BP: 150/86 162/61 161/66 148/61  Pulse: 79 72 73 64  Temp:      TempSrc:      Resp: 23 17 15 22   Height:      Weight:      SpO2: 99% 97% 96% 100%    Intake/Output Summary (Last 24 hours) at 03/17/14 1211 Last data filed at 03/17/14 1200  Gross per 24 hour  Intake 1011.32 ml  Output    450 ml  Net 561.32 ml   Weight change:  Exam:   General:  Pt is alert, follows commands appropriately, not in acute distress  HEENT: No icterus, No thrush,[place]/AT  Cardiovascular: RRR, S1/S2, no rubs, no gallops  Respiratory: CTA bilaterally, no wheezing, no crackles, no rhonchi  Abdomen: Soft/+BS, non tender, non distended, no guarding  Extremities: No edema, No lymphangitis, No petechiae, No rashes, no synovitis  Data Reviewed: Basic Metabolic Panel:  Recent Labs Lab 03/12/14 1808 03/13/14 0555 03/16/14 0432 03/17/14 0418  NA 135* 132* 130* 125*  K 4.3 4.3 3.8 3.5*  CL 93* 98 95* 92*  CO2 24 22 19 20   GLUCOSE 101* 105* 134* 179*  BUN 18 16  12 9   CREATININE 1.26* 1.21* 1.04 0.97  CALCIUM 11.1* 9.4 10.0 9.3   Liver Function Tests:  Recent Labs Lab 03/12/14 1808 03/13/14 0555 03/16/14 0432  AST 26 21 19   ALT 16 13 12   ALKPHOS 77 60 74  BILITOT 0.7 0.6 0.6  PROT 8.3 6.7 7.6  ALBUMIN 4.4 3.4* 3.6    Recent Labs Lab 03/12/14 1808  LIPASE 49   No results for input(s): AMMONIA in the last 168 hours. CBC:  Recent Labs Lab 03/12/14 1808 03/13/14 0555 03/16/14 0432 03/17/14 0418  WBC 5.5 6.2 6.5 6.1  NEUTROABS 3.7 4.1  --   --   HGB 12.0 10.1* 12.7 10.1*  HCT 34.7* 28.8* 36.8 28.7*  MCV 89.0 87.5 89.1 87.0  PLT 289 240 307 298   Cardiac Enzymes:  Recent Labs Lab 03/14/14 1337 03/14/14 1808 03/16/14 0805 03/16/14 1405 03/16/14 2100  TROPONINI <0.30 <0.30 <0.30 <0.30 <0.30   BNP: Invalid input(s): POCBNP CBG:  Recent Labs Lab 03/16/14 1606 03/16/14 2009 03/17/14 0032 03/17/14 0453 03/17/14 0743  GLUCAP 163* 183* 164* 157* 169*    Recent Results (from the past 240 hour(s))  Urine culture     Status: None   Collection Time: 03/12/14  7:47 PM  Result Value Ref Range Status   Specimen Description URINE, RANDOM  Final   Special Requests NONE  Final   Culture  Setup Time   Final    03/13/2014 01:31 Performed at [facility] Count   Final    50,000 COLONIES/ML Performed at [facility]  Culture   Final    Multiple bacterial morphotypes present, none predominant. Suggest appropriate recollection if clinically indicated. Performed at [facility]    Report Status 03/13/2014 FINAL  Final  MRSA PCR Screening     Status: None   Collection Time: 03/16/14 12:36 PM  Result Value Ref Range Status   MRSA by PCR NEGATIVE NEGATIVE Final    Comment:        The GeneXpert MRSA Assay (FDA approved for NASAL specimens only), is one component of a comprehensive MRSA colonization surveillance program. It is not intended to diagnose MRSA infection nor to guide  or monitor treatment for MRSA infections.      Scheduled Meds: . antiseptic oral rinse  7 mL Mouth Rinse q12n4p  . chlorhexidine  15 mL Mouth Rinse BID  . levothyroxine  87.5 mcg Intravenous Daily  . metoprolol  7.5 mg Intravenous 4 times per day  . pantoprazole (PROTONIX) IV  40 mg Intravenous Q12H   Continuous Infusions: . nitroGLYCERIN 100 mcg/min (03/17/14 1200)  . sodium chloride 0.9 % 1,000 mL with potassium chloride 20 mEq infusion 75 mL/hr at 03/17/14 1200     Jermiah Howton, DO  [facility] Pager [phone]  If 7PM-7AM, please contact night-coverage [email or web address] Password [email or web address] 03/17/2014, 12:11 PM   LOS: 5 days

## 2014-03-17 NOTE — Progress Notes (Signed)
    Progress Note   Subjective  Feels terrible. She has a headache and complains of pain in throat.    Objective   Vital signs in last 24 hours: Temp:  [97.9 F (36.6 C)-98.8 F (37.1 C)] 97.9 F (36.6 C) (12/15 0800) Pulse Rate:  [58-81] 71 (12/15 0700) Resp:  [12-25] 15 (12/15 0700) BP: (97-231)/(45-158) 147/56 mmHg (12/15 0700) SpO2:  [94 %-100 %] 97 % (12/15 0700) Last BM Date: 03/13/14 General:    black female uncomfortable with significant throat pain.  Heart:  Regular rate and rhythm Abdomen:  Soft, nontender and nondistended. Normal bowel sounds. Neurologic:  Alert and oriented,  grossly normal neurologically. Psych:  Cooperative. Normal mood and affect.  Intake/Output from previous day: 12/14 0701 - 12/15 0700 In: 538.1 [I.V.:538.1] Out: 600 [Urine:500; Emesis/NG output:100] Intake/Output this shift:    Lab Results:  Recent Labs  03/16/14 0432 03/17/14 0418  WBC 6.5 6.1  HGB 12.7 10.1*  HCT 36.8 28.7*  PLT 307 298   BMET  Recent Labs  03/16/14 0432 03/17/14 0418  NA 130* 125*  K 3.8 3.5*  CL 95* 92*  CO2 19 20  GLUCOSE 134* 179*  BUN 12 9  CREATININE 1.04 0.97  CALCIUM 10.0 9.3      Assessment / Plan:    21. 80 year old female with complicated surgical GI history as described in previous progress note.  Admitted now with weight loss, upper abdominal pain, early satiety. CTscan reveals probable bezoar, marked thickening of pyloric channel and antrum. Minimal NGT output.  EGD postponed yesterday secondary to #2. BP stable, will proceed with EGD today.  2. Multiple medical problems. Patient transferred to [facility] yesterday for hypertensive urgency. BP stable. Can't transition off IV drips to PO anti-hypertensive because of partial GOO. Given little NGT output we can hopefully d/c it soon.    3. Throat pain, ? Secondary to NGT. No obvious oral candida. Trial of lidocaine viscous.  Addendum: Anesthesiology doesn't feel patient quite stable enough for  sedation. Will postpone EGD again today. Given scant NGT output will d/c NGT tube. If no vomiting in 4 hours can try sips. Will reevaluate in am for possible EGD. Check am BMET     LOS: 5 days   Shannon [name]  03/17/2014, 9:01 AM   ________________________________________________________________________  Velora Heckler GI MD note:  I personally examined the patient, reviewed the data and agree with the assessment and plan described above.  She feels overall much better since NG tube pulled earlier. No nausea or vomiting.  Was planning on EGD today but there was concern about sedating and so that was cancelled.  I will order clears and in AM we will assess her again.   Owens Loffler, MD [facility] Pager [phone]

## 2014-03-18 DIAGNOSIS — R079 Chest pain, unspecified: Secondary | ICD-10-CM | POA: Insufficient documentation

## 2014-03-18 DIAGNOSIS — R0789 Other chest pain: Secondary | ICD-10-CM

## 2014-03-18 LAB — BASIC METABOLIC PANEL
Anion gap: 13 (ref 5–15)
BUN: 7 mg/dL (ref 6–23)
CO2: 20 mEq/L (ref 19–32)
Calcium: 9.4 mg/dL (ref 8.4–10.5)
Chloride: 93 mEq/L — ABNORMAL LOW (ref 96–112)
Creatinine, Ser: 0.96 mg/dL (ref 0.50–1.10)
GFR calc Af Amer: 59 mL/min — ABNORMAL LOW (ref >90)
GFR calc non Af Amer: 51 mL/min — ABNORMAL LOW (ref >90)
Glucose, Bld: 116 mg/dL — ABNORMAL HIGH (ref 70–99)
Potassium: 4.3 mEq/L (ref 3.7–5.3)
Sodium: 126 mEq/L — ABNORMAL LOW (ref 137–147)

## 2014-03-18 LAB — CBC
HCT: 29 % — ABNORMAL LOW (ref 36.0–46.0)
Hemoglobin: 10.1 g/dL — ABNORMAL LOW (ref 12.0–15.0)
MCH: 30.7 pg (ref 26.0–34.0)
MCHC: 34.8 g/dL (ref 30.0–36.0)
MCV: 88.1 fL (ref 78.0–100.0)
Platelets: 268 10*3/uL (ref 150–400)
RBC: 3.29 MIL/uL — ABNORMAL LOW (ref 3.87–5.11)
RDW: 15.5 % (ref 11.5–15.5)
WBC: 5.5 10*3/uL (ref 4.0–10.5)

## 2014-03-18 LAB — MAGNESIUM: Magnesium: 1.5 mg/dL (ref 1.5–2.5)

## 2014-03-18 MED ORDER — LABETALOL HCL 5 MG/ML IV SOLN
5.0000 mg | Freq: Three times a day (TID) | INTRAVENOUS | Status: DC
  Administered 2014-03-18 – 2014-03-19 (×4): 5 mg via INTRAVENOUS
  Filled 2014-03-18 (×4): qty 4

## 2014-03-18 MED ORDER — HYDRALAZINE HCL 20 MG/ML IJ SOLN
10.0000 mg | Freq: Four times a day (QID) | INTRAMUSCULAR | Status: DC
  Administered 2014-03-18 – 2014-03-26 (×31): 10 mg via INTRAVENOUS
  Filled 2014-03-18: qty 0.5
  Filled 2014-03-18 (×2): qty 1
  Filled 2014-03-18: qty 0.5
  Filled 2014-03-18 (×8): qty 1
  Filled 2014-03-18: qty 0.5
  Filled 2014-03-18 (×12): qty 1
  Filled 2014-03-18: qty 0.5
  Filled 2014-03-18 (×2): qty 1
  Filled 2014-03-18: qty 0.5
  Filled 2014-03-18 (×3): qty 1
  Filled 2014-03-18 (×2): qty 0.5
  Filled 2014-03-18 (×2): qty 1
  Filled 2014-03-18: qty 0.5

## 2014-03-18 MED ORDER — LABETALOL HCL 5 MG/ML IV SOLN
20.0000 mg | Freq: Once | INTRAVENOUS | Status: AC
Start: 2014-03-18 — End: 2014-03-18
  Administered 2014-03-18: 20 mg via INTRAVENOUS
  Filled 2014-03-18: qty 4

## 2014-03-18 NOTE — Progress Notes (Signed)
Text-paged MD Dyann Kief to make aware having to go up on Nitro; currently at 45 mcg.

## 2014-03-18 NOTE — Progress Notes (Signed)
Text-paged MD Dyann Kief about patient's complaints of heavy feeling in chest. HR 76 BP 202/78 RR 13; went up on nitro. Awaiting response.

## 2014-03-18 NOTE — Progress Notes (Signed)
PROGRESS NOTE  Shannon [name] XBM:[id number] DOB: 09-28-1923 DOA: 03/12/2014 PCP: Oneal Grout, MD   Brief history 80 y.o. female with remote gastro-jejunostomy for peptic ulcer disease. She unfortunately had a perforation of an anastomotic ulcer at the gastro-jejunal anastomosis in June 2015 and underwent surgery at [facility].Patient was hospitalized here in October 2015 with abdominal pain nausea and vomiting and had endoscopy done twice during that admission with finding of a gastric bezoar.She was discharged home on a pured diet and PPI therapy.She unfortunately present on 12/10 with abd pain/vomiting, CT scan of abdomen showed persistent marked thickening of the pyloric channel and prepyloric antrum and a large filling defect in the stomach measuring 12 x 10 x 15 cm consistent with a bezoar.She was admitted for further evaluation and treatment. GI and Gen Surgery have been consulted.  Surgery did not feel that there was any indication for surgical intervention at this time. GI plans EGD when the patient is stable medically, hopefully 03/18/2014  Assessment/Plan: Gastric Outlet Obstruction: -secondary to Bezoar most likely   -Initially some question of perforation at GE junction on CT Scan, but this was ruled out on Barium Esophagogram.  -continues to feel floated/belching-having chest pain-suspect from upward pressure from outlet obstruction- -NGT for decompression--removed 03/17/14-->CLD allowed and now waiting for EGD by GI -will follow rec's  HTN Urgency -Patient was transferred to SDU on 03/16/14 and started on NTG drip -BP still elevated -will continue hydralazine, dose adjusted for better control -patient also started on labetalol -will titrate off NTG drip   Atypical Chest pain  -Troponins negative 3  -EKG with nonspecific changes -most likely secondary to GERD and hypertensive urgency -continue PPI and BP control   Diabetes mellitus type 2 -12/24/2013  hemoglobin A1c 6.9 -Given the patient's age and current recommendations, will allow for lobar SEMEN control -will monitor -low carb diet when able to tolerate PO's  hyponatremia  -Secondary to hypotonic fluid given on admission  -continue normal saline   CKD stage 3 -baseline creatinine 0.9-1.3 -appears to be stable/at baseline currently -will monitor  hypokalemia -repletion/maintenance on IVF  -MG level WNL  Hypothyroidism: -continue with IV Levothyroxine.   Hx CVA ASA Suppositories until able to take po  Depression:  -since NPO-all meds on hold-resume when able to tolerate by mouth meds  Remote hx of Gastrojejunostomy with perforation of anastomotic ulcer in June 2015-s/p revision/laparotomy   Family Communication:   Pt at beside Disposition Plan:   Home when medically stable      Procedures/Studies: Ct Abdomen Pelvis Wo Contrast  03/12/2014   CLINICAL DATA:  Constant vomiting today, constipated since Saturday, RIGHT lower quadrant rectal pain, history diabetes, hypertension, prior gastric bezoar  EXAM: CT ABDOMEN AND PELVIS WITHOUT CONTRAST  TECHNIQUE: Multidetector CT imaging of the abdomen and pelvis was performed following the standard protocol without IV contrast. Sagittal and coronal MPR images reconstructed from axial data set.  COMPARISON:  03/07/2014  FINDINGS: Minimal bibasilar atelectasis.  Low-attenuation foci within liver, question cysts, unchanged.  Metallic foreign body at LEFT ventricle again identified, uncertain etiology.  Gallbladder surgically absent.  Probable tiny LEFT renal cyst 9 mm diameter.  Within limits of a nonenhanced exam, remainder of liver, spleen, pancreas, kidneys and adrenal glands normal appearance.  Persistent marked thickening of the pyloric channel and pre pyloric antrum gastric distention question partial gastric outlet obstruction.  Small amount of ingested contrast is present within proximal small bowel loops.  Large filling defect  within the stomach, 12.2 x 10.0 x 15.2 cm, containing bubbly gas, consistent with recurrent bezoar.  Scattered colonic diverticula.  No evidence of bowel obstruction or dilatation.  Extensive atherosclerotic calcifications aorta and minimally in coronary arteries.  High attenuation nodule 13 mm diameter again identified adjacent to the thickened gastroesophageal junction, unchanged but since 03/07/2014 but new since 01/17/2014, question contrast within epiphrenic diverticulum or small focal contained perforation, unlikely calcified lymph node since this is new in past 2 months.  Unremarkable bladder, ureters, uterus and adnexae.  Appendix not visualized.  No definite rectal wall thickening.  No additional mass, adenopathy, free fluid, free air or abscess.  Bones demineralized with scattered degenerative changes.  Surgical scar at ventral abdomen with a small incisional hernia containing a portion of transverse colon.  IMPRESSION: Thickened pylorus and pre-pyloric antrum likely causing partial outlet obstruction.  Large filling defect within stomach consistent with recurrent bezoar 12.2 x 10.0 x 15.2 cm.  Colonic diverticulosis.  Suspected hepatic cysts unchanged.  13 mm high attenuation nodule adjacent to thickened gastroesophageal junction, question contained perforation versus epiphrenic diverticulum containing contrast, new since 01/17/2014 but unchanged since 03/07/2014.  Small incisional hernia containing a portion of the transverse colon wall without obstruction.   Electronically Signed   By: Ulyses Southward M.D.   On: 03/12/2014 23:12   Ct Abdomen Pelvis W Contrast  03/07/2014   CLINICAL DATA:  80 year old female presenting with rectal pressure gradually worsening over the prior month.  EXAM: CT ABDOMEN AND PELVIS WITH CONTRAST  TECHNIQUE: Multidetector CT imaging of the abdomen and pelvis was performed using the standard protocol following bolus administration of intravenous contrast.  CONTRAST:  50mL  OMNIPAQUE IOHEXOL 300 MG/ML SOLN, 80mL OMNIPAQUE IOHEXOL 300 MG/ML SOLN  COMPARISON:  CT of the abdomen and pelvis 01/17/2014.  FINDINGS: Lower chest: Cardiomegaly with biatrial dilatation. Calcifications of the aortic valve. Atherosclerotic calcifications in the left main and left circumflex coronary arteries. Small hiatal hernia.  Hepatobiliary: Multifocal low-attenuation lesions appear unchanged in size, number and distribution scattered throughout the liver. Many these are too small to definitively characterize, while the larger lesions are intermediate attenuation, favored to represent proteinaceous cysts. The largest of these lesions measures only 12 mm in segment 2 (image 21 of series 2). No new hepatic lesions are noted. No intra or extrahepatic biliary ductal dilatation. Status post cholecystectomy.  Pancreas: Unremarkable.  Spleen: Unremarkable.  Adrenals/Urinary Tract: Again noted is a 1.2 cm intermediate attenuation (23 HU) lesion in the lower pole of the left kidney, with some surrounding architectural distortion, favored to represent a previously ruptured proteinaceous cyst. No suspicious renal lesions are noted. Multifocal cortical thinning in the kidneys bilaterally, presumably mild scarring. No hydroureteronephrosis. Urinary bladder is normal in appearance. Bilateral adrenal glands are normal in appearance.  Stomach/Bowel: As with the prior examination there is extensive thickening of the gastric wall in the antral pre-pyloric region of the stomach. The pylorus is also markedly thickened (up to 15 mm in thickness). The stomach is moderately distended. No pathologic dilatation of small bowel or colon. Postoperative changes of partial colectomy near the rectosigmoid junction are noted. A few scattered colonic diverticulae are noted, without surrounding inflammatory changes to suggest an acute diverticulitis at this time. The distal rectum and anus are grossly unremarkable in appearance.   Vascular/Lymphatic: Extensive atherosclerosis throughout the abdominal and pelvic vasculature, without evidence of aneurysm or dissection. No lymphadenopathy noted in the abdomen or pelvis.  Reproductive: Retroverted uterus.  Ovaries  are atrophic.  Other: No significant volume of ascites.  No pneumoperitoneum.  Musculoskeletal: Tiny ventral hernia, through which there is incomplete herniation of the mid transverse colon; no findings to suggest associated bowel incarceration or obstruction at this time. Old compression fracture of superior endplate of T11 with approximately 20% loss of anterior vertebral body height (unchanged). There are no aggressive appearing lytic or blastic lesions noted in the visualized portions of the skeleton.  IMPRESSION: 1. No findings to account for the patient's worsening rectal pressure. 2. There is persistent thickening of the pylorus and antral pre-pyloric region of the stomach, which could be inflammatory in the setting of ulcer disease, or could be neoplastic. There is persistent moderate dilatation of the stomach, suggesting some degree of gastric outlet obstruction. Correlation with results of recent upper endoscopy is suggested. 3. Additional incidental findings, as above, similar to prior examination.   Electronically Signed   By: Trudie Reed M.D.   On: 03/07/2014 16:28   Dg Chest Port 1 View  03/14/2014   CLINICAL DATA:  80 year old female with new onset chest pain beginning today  EXAM: PORTABLE CHEST - 1 VIEW  COMPARISON:  None.  FINDINGS: Cardiomegaly. Atherosclerotic calcification present in the transverse aorta. No focal airspace consolidation, pulmonary edema, pneumothorax. a query trace left pleural effusion. No acute osseous abnormality. The left glenohumeral joint osteoarthritis. Mild coarsening of the interstitial markings.  IMPRESSION: 1. Query trace left pleural effusion. 2. Cardiomegaly. 3. Aortic atherosclerosis.   Electronically Signed   By: Malachy Moan M.D.   On: 03/14/2014 10:12   Dg Abd Portable 1v  03/15/2014   CLINICAL DATA:  Nasogastric tube placement.  EXAM: PORTABLE ABDOMEN - 1 VIEW  COMPARISON:  03/14/2014  FINDINGS: The nasogastric tube is in place, tip coiled within the stomach and tip overlying the region of the fundus. Surgical clips are noted in the right upper quadrant. There is residual contrast in the colon and distal small bowel following esophagram on 03/13/2014. Bowel gas pattern is nonobstructive.  IMPRESSION: Nasogastric tube coiled within the stomach.   Electronically Signed   By: Rosalie Gums M.D.   On: 03/15/2014 10:49   Dg Abd Portable 2v  03/14/2014   CLINICAL DATA:  80 year old female with persistent abdominal pain and fullness.  EXAM: PORTABLE ABDOMEN - 2 VIEW  COMPARISON:  Esophagram performed yesterday. CT abdomen/ pelvis 03/12/2014  FINDINGS: No evidence of free air on the lateral decubitus view. Contrast material is noted within the stomach and scattered throughout the large bowel. There is no evidence of obstruction. The volume of contrast material remaining within the stomach is greater than expected. The bones are diffusely osteopenic. Surgical clips in the right upper quadrant suggest prior cholecystectomy.  IMPRESSION: 1. No evidence of obstruction or free air. 2. Greater than expected volume of retained oral contrast within the stomach. Query clinical history of delayed gastric emptying or gastroparesis? 3. The bones appear osteopenic.   Electronically Signed   By: Malachy Moan M.D.   On: 03/14/2014 10:04   Dg Esophagus W/water Sol Cm  03/13/2014   CLINICAL DATA:  Abnormal CT, evaluate for esophageal perforation  EXAM: ESOPHOGRAM/BARIUM SWALLOW  TECHNIQUE: Single contrast examination was performed using water-soluble contrast.  FLUOROSCOPY TIME:  1 min 7 seconds  COMPARISON:  CT abdomen pelvis dated 03/12/2014 and 03/07/2014  FINDINGS: Esophageal dysmotility.  No fixed esophageal narrowing/stricture.   Suspected small paraesophageal hernia (less likely hiatal hernia or esophageal diverticulum) at the GE junction. The appearance does  not suggest a contained esophageal perforation.  Presence of gastroesophageal reflux could not be assessed due to the patient's inability to clear her esophagus.  Moderate food/debris in the stomach.  IMPRESSION: Esophageal dysmotility.  No fixed esophageal narrowing/stricture.  Suspected small paraesophageal hernia at the GE junction. No evidence of contained esophageal perforation.  Moderate food/debris in the stomach.   Electronically Signed   By: Charline Bills M.D.   On: 03/13/2014 09:44        Subjective: Patient denies CP, SOB or any further nausea or vomiting. Sodium remains low (with marginal improvement); BP still uncontrolled.   Objective: Filed Vitals:   03/18/14 0415 03/18/14 0500 03/18/14 0600 03/18/14 0800  BP:    174/65  Pulse: 61 65 82   Temp:    98.3 F (36.8 C)  TempSrc:    Oral  Resp: 15 17 14    Height:      Weight:      SpO2: 98% 97% 99%     Intake/Output Summary (Last 24 hours) at 03/18/14 1150 Last data filed at 03/18/14 1109  Gross per 24 hour  Intake 2171.35 ml  Output   2125 ml  Net  46.35 ml   Weight change:  Exam:   General:  Pt is alert, follows commands appropriately, not in acute distress. Denies CP or SOB  HEENT: No icterus, No thrush,[place]/AT  Cardiovascular: RRR, S1/S2, no rubs, no gallops  Respiratory: CTA bilaterally, no wheezing, no crackles, no rhonchi  Abdomen: Soft/+BS, non tender, non distended, no guarding  Extremities: No edema, No lymphangitis, No petechiae, No rashes, no synovitis  Data Reviewed: Basic Metabolic Panel:  Recent Labs Lab 03/12/14 1808 03/13/14 0555 03/16/14 0432 03/17/14 0418 03/18/14 0615  NA 135* 132* 130* 125* 126*  K 4.3 4.3 3.8 3.5* 4.3  CL 93* 98 95* 92* 93*  CO2 24 22 19 20 20   GLUCOSE 101* 105* 134* 179* 116*  BUN 18 16 12 9 7   CREATININE 1.26* 1.21* 1.04  0.97 0.96  CALCIUM 11.1* 9.4 10.0 9.3 9.4  MG  --   --   --   --  1.5   Liver Function Tests:  Recent Labs Lab 03/12/14 1808 03/13/14 0555 03/16/14 0432  AST 26 21 19   ALT 16 13 12   ALKPHOS 77 60 74  BILITOT 0.7 0.6 0.6  PROT 8.3 6.7 7.6  ALBUMIN 4.4 3.4* 3.6    Recent Labs Lab 03/12/14 1808  LIPASE 49   CBC:  Recent Labs Lab 03/12/14 1808 03/13/14 0555 03/16/14 0432 03/17/14 0418 03/18/14 0615  WBC 5.5 6.2 6.5 6.1 5.5  NEUTROABS 3.7 4.1  --   --   --   HGB 12.0 10.1* 12.7 10.1* 10.1*  HCT 34.7* 28.8* 36.8 28.7* 29.0*  MCV 89.0 87.5 89.1 87.0 88.1  PLT 289 240 307 298 268   Cardiac Enzymes:  Recent Labs Lab 03/14/14 1337 03/14/14 1808 03/16/14 0805 03/16/14 1405 03/16/14 2100  TROPONINI <0.30 <0.30 <0.30 <0.30 <0.30   BNP: Invalid input(s): POCBNP CBG:  Recent Labs Lab 03/17/14 0032 03/17/14 0453 03/17/14 0743 03/17/14 1325 03/17/14 1612  GLUCAP 164* 157* 169* 121* 120*    Recent Results (from the past 240 hour(s))  Urine culture     Status: None   Collection Time: 03/12/14  7:47 PM  Result Value Ref Range Status   Specimen Description URINE, RANDOM  Final   Special Requests NONE  Final   Culture  Setup Time   Final  03/13/2014 01:31 Performed at [facility] Count   Final    50,000 COLONIES/ML Performed at [facility]   Final    Multiple bacterial morphotypes present, none predominant. Suggest appropriate recollection if clinically indicated. Performed at [facility]    Report Status 03/13/2014 FINAL  Final  MRSA PCR Screening     Status: None   Collection Time: 03/16/14 12:36 PM  Result Value Ref Range Status   MRSA by PCR NEGATIVE NEGATIVE Final    Comment:        The GeneXpert MRSA Assay (FDA approved for NASAL specimens only), is one component of a comprehensive MRSA colonization surveillance program. It is not intended to diagnose MRSA infection nor to guide  or monitor treatment for MRSA infections.      Scheduled Meds: . antiseptic oral rinse  7 mL Mouth Rinse q12n4p  . chlorhexidine  15 mL Mouth Rinse BID  . hydrALAZINE  10 mg Intravenous 4 times per day  . labetalol  5 mg Intravenous 3 times per day  . levothyroxine  87.5 mcg Intravenous Daily  . pantoprazole (PROTONIX) IV  40 mg Intravenous Q12H   Continuous Infusions: . nitroGLYCERIN 45 mcg/min (03/18/14 1109)  . sodium chloride 0.9 % 1,000 mL with potassium chloride 20 mEq infusion 75 mL/hr at 03/17/14 2356     Time: 30 minutes  Vassie Loll, MD [facility] Pager [phone]  If 7PM-7AM, please contact night-coverage [email or web address] Password [email or web address] 03/18/2014, 11:50 AM   LOS: 6 days

## 2014-03-18 NOTE — Progress Notes (Signed)
MD Ardis Hughs stated plan is to do EGD tomorrow at 09:30.

## 2014-03-18 NOTE — Progress Notes (Signed)
    Progress Note   Subjective  feels much better with NGT out   Objective   Vital signs in last 24 hours: Temp:  [98.1 F (36.7 C)-99.1 F (37.3 C)] 99.1 F (37.3 C) (12/16 0400) Pulse Rate:  [61-85] 82 (12/16 0600) Resp:  [13-31] 14 (12/16 0600) BP: (146-182)/(53-103) 174/65 mmHg (12/16 0800) SpO2:  [96 %-100 %] 99 % (12/16 0600) Weight:  [144 lb 6.4 oz (65.5 kg)] 144 lb 6.4 oz (65.5 kg) (12/16 0400) Last BM Date: 03/13/14 General:    Pleasant black female in NAD Heart:  Regular rate and rhythm Abdomen:  Soft, nontender and nondistended. Normal bowel sounds. Extremities:  Without edema. Neurologic:  Alert and oriented,  grossly normal neurologically. Psych:  Cooperative. Normal mood and affect.   Lab Results:  Recent Labs  03/16/14 0432 03/17/14 0418 03/18/14 0615  WBC 6.5 6.1 5.5  HGB 12.7 10.1* 10.1*  HCT 36.8 28.7* 29.0*  PLT 307 298 268   BMET  Recent Labs  03/16/14 0432 03/17/14 0418 03/18/14 0615  NA 130* 125* 126*  K 3.8 3.5* 4.3  CL 95* 92* 93*  CO2 19 20 20   GLUCOSE 134* 179* 116*  BUN 12 9 7   CREATININE 1.04 0.97 0.96  CALCIUM 10.0 9.3 9.4   LFT  Recent Labs  03/16/14 0432  PROT 7.6  ALBUMIN 3.6  AST 19  ALT 12  ALKPHOS 74  BILITOT 0.6      Assessment / Plan:    60. 80 year old female with complicated surgical GI history as described in previous progress note. Admitted now with weight loss, upper abdominal pain, early satiety. CTscan reveals probable bezoar, marked thickening of pyloric channel and antrum.  EGD postponed due to medical instability. Minimal NGT output so NGT pulled yesterday. No nausea or vomiting, she feels okay. Will try her on sips of clears today.   2. Throat pain, ? Secondary to NGT. Improved. Will stop Lidocaine viscous.   3. Constipation. Will give dulcolax supp. Hopefully bowels will move once eating. If not, can try miralax.    LOS: 6 days   Tye Savoy  03/18/2014, 9:46  AM   ________________________________________________________________________  Velora Heckler GI MD note:  I personally examined the patient, reviewed the data and agree with the assessment and plan described above.  Will plan on EGD tomorrow.     Owens Loffler, MD [facility] Pager [phone]

## 2014-03-19 ENCOUNTER — Inpatient Hospital Stay (HOSPITAL_COMMUNITY): Payer: Medicare Other | Admitting: Anesthesiology

## 2014-03-19 ENCOUNTER — Encounter (HOSPITAL_COMMUNITY)
Admission: EM | Disposition: A | Discharge status: Other Acute Inpatient Hospital | Payer: Self-pay | Source: Home / Self Care | Attending: Internal Medicine

## 2014-03-19 ENCOUNTER — Ambulatory Visit: Payer: Medicare Other | Admitting: Cardiology

## 2014-03-19 ENCOUNTER — Encounter (HOSPITAL_COMMUNITY): Payer: Self-pay

## 2014-03-19 HISTORY — PX: ESOPHAGOGASTRODUODENOSCOPY: SHX5428

## 2014-03-19 LAB — BASIC METABOLIC PANEL WITH GFR
Anion gap: 13 (ref 5–15)
BUN: 6 mg/dL (ref 6–23)
CO2: 19 meq/L (ref 19–32)
Calcium: 9.3 mg/dL (ref 8.4–10.5)
Chloride: 100 meq/L (ref 96–112)
Creatinine, Ser: 0.94 mg/dL (ref 0.50–1.10)
GFR calc Af Amer: 61 mL/min — ABNORMAL LOW
GFR calc non Af Amer: 53 mL/min — ABNORMAL LOW
Glucose, Bld: 120 mg/dL — ABNORMAL HIGH (ref 70–99)
Potassium: 4 meq/L (ref 3.7–5.3)
Sodium: 132 meq/L — ABNORMAL LOW (ref 137–147)

## 2014-03-19 LAB — CBC
HCT: 27.6 % — ABNORMAL LOW (ref 36.0–46.0)
Hemoglobin: 9.7 g/dL — ABNORMAL LOW (ref 12.0–15.0)
MCH: 31.1 pg (ref 26.0–34.0)
MCHC: 35.1 g/dL (ref 30.0–36.0)
MCV: 88.5 fL (ref 78.0–100.0)
Platelets: 309 K/uL (ref 150–400)
RBC: 3.12 MIL/uL — ABNORMAL LOW (ref 3.87–5.11)
RDW: 15.7 % — ABNORMAL HIGH (ref 11.5–15.5)
WBC: 5.2 K/uL (ref 4.0–10.5)

## 2014-03-19 SURGERY — EGD (ESOPHAGOGASTRODUODENOSCOPY)
Anesthesia: Monitor Anesthesia Care

## 2014-03-19 MED ORDER — METOCLOPRAMIDE HCL 5 MG PO TABS: 5.0000 mg | ORAL_TABLET | Freq: Four times a day (QID) | ORAL | Status: DC | PRN

## 2014-03-19 MED ORDER — PROPOFOL INFUSION 10 MG/ML OPTIME
INTRAVENOUS | Status: DC | PRN
  Administered 2014-03-19: 100 ug/kg/min via INTRAVENOUS

## 2014-03-19 MED ORDER — LIDOCAINE HCL (CARDIAC) 20 MG/ML IV SOLN
INTRAVENOUS | Status: AC
  Filled 2014-03-19: qty 5

## 2014-03-19 MED ORDER — LABETALOL HCL 5 MG/ML IV SOLN
20.0000 mg | Freq: Four times a day (QID) | INTRAVENOUS | Status: DC
  Administered 2014-03-19 – 2014-03-21 (×9): 20 mg via INTRAVENOUS
  Filled 2014-03-19 (×11): qty 4

## 2014-03-19 MED ORDER — METOCLOPRAMIDE HCL 5 MG/ML IJ SOLN
10.0000 mg | Freq: Three times a day (TID) | INTRAMUSCULAR | Status: DC
  Administered 2014-03-19 – 2014-03-26 (×20): 10 mg via INTRAVENOUS
  Filled 2014-03-19 (×25): qty 2

## 2014-03-19 MED ORDER — PROPOFOL 10 MG/ML IV BOLUS
INTRAVENOUS | Status: AC
  Filled 2014-03-19: qty 20

## 2014-03-19 MED ORDER — METOPROLOL TARTRATE 1 MG/ML IV SOLN
5.0000 mg | INTRAVENOUS | Status: DC
Start: 2014-03-19 — End: 2014-03-19

## 2014-03-19 MED ORDER — POLYVINYL ALCOHOL 1.4 % OP SOLN
1.0000 [drp] | OPHTHALMIC | Status: DC | PRN
  Administered 2014-03-19 – 2014-03-24 (×5): 1 [drp] via OPHTHALMIC
  Filled 2014-03-19: qty 15

## 2014-03-19 MED ORDER — LATANOPROST 0.005 % OP SOLN
1.0000 [drp] | Freq: Every day | OPHTHALMIC | Status: DC
  Administered 2014-03-19 – 2014-03-25 (×7): 1 [drp] via OPHTHALMIC
  Filled 2014-03-19 (×2): qty 2.5

## 2014-03-19 MED ORDER — CYCLOSPORINE 0.05 % OP EMUL
1.0000 [drp] | Freq: Two times a day (BID) | OPHTHALMIC | Status: DC
  Administered 2014-03-19 – 2014-03-26 (×14): 1 [drp] via OPHTHALMIC
  Filled 2014-03-19 (×18): qty 1

## 2014-03-19 NOTE — Anesthesia Preprocedure Evaluation (Addendum)
Anesthesia Evaluation  Patient identified by MRN, date of birth, ID band Patient awake    Reviewed: Allergy & Precautions, H&P , NPO status , Patient's Chart, lab work & pertinent test results, reviewed documented beta blocker date and time   History of Anesthesia Complications Negative for: history of anesthetic complications  Airway Mallampati: III  TM Distance: >3 FB Neck ROM: Full    Dental no notable dental hx. (+) Poor Dentition, Missing   Pulmonary neg pulmonary ROS, pneumonia -,  breath sounds clear to auscultation  Pulmonary exam normal       Cardiovascular hypertension, Pt. on medications and Pt. on home beta blockers + CAD, + Cardiac Stents and + Peripheral Vascular Disease + Valvular Problems/Murmurs AI Rhythm:Regular Rate:Normal     Neuro/Psych PSYCHIATRIC DISORDERS Anxiety Depression CVA (L sided weakness), Residual Symptoms    GI/Hepatic Neg liver ROS, PUD, GERD-  Medicated and Controlled,  Endo/Other  negative endocrine ROSdiabetes, Type 2Hypothyroidism   Renal/GU CRFRenal disease  negative genitourinary   Musculoskeletal negative musculoskeletal ROS (+)   Abdominal   Peds negative pediatric ROS (+)  Hematology  (+) anemia ,   Anesthesia Other Findings   Reproductive/Obstetrics negative OB ROS                             Anesthesia Physical  Anesthesia Plan  ASA: IV  Anesthesia Plan: MAC   Post-op Pain Management:    Induction: Intravenous  Airway Management Planned:   Additional Equipment:   Intra-op Plan:   Post-operative Plan:   Informed Consent: I have reviewed the patients History and Physical, chart, labs and discussed the procedure including the risks, benefits and alternatives for the proposed anesthesia with the patient or authorized representative who has indicated his/her understanding and acceptance.   Dental advisory given  Plan Discussed with:  CRNA  Anesthesia Plan Comments:        Anesthesia Quick Evaluation

## 2014-03-19 NOTE — Progress Notes (Signed)
Day of Surgery  Subjective: Ask to see again for gastric outlet obstruction. She was admitted and seen by Dr. Marcello Moores on 03/13/14.  She presented with nausea and vomiting.  And possible contained esophageal perforation.  She was seen in October for gastric bezoar, with EGD and pulverization.  On admit here there was a question of possible contained GE junction perforation at this admission.   CT on admit showed a stable fluid collection.  NG was ordered but refused.  Gastro-graffin swallow showed esophageal dysmotility, no fixed narrowing or stricture, small para esophageal hernia, no evidence of contained perforation.  CT showed a 12 x 19 x 15 cm bezoar in the stomach.  She was going to have EGD and treatment by GI on 03/14/14,unfortunately she started having chest pain/labored respirations.   Plans for EGD was postponed at this point; she also had issues with poorly controlled hypertension and chest pain.  She was transferred to ICU, with chest pain and poorly controlled hypertension.  Aline was placed, NG placed, and BP better controlled.   She stabilized and EGD was performed on 03/19/14.  500 ml fluid in the stomach at exam. The distal gastric anatomy (prepylorus, pylorus) was very tortuous, narrowed without clear strictures or ulcers. He was able to advance the adult gastroscope through this region with only minimal pressure. The mucosa is normal through the tortuous region. The duodenal bulb and more distal duodenum appeared normal.  It was his opinion the distorted distal gastric anatomy was causing this partial obstruction.  We are ask to see in consult for possible revision.  1.  Prior history of gastrojejunostomy for PUD, date is uncertain.  2.   Perforated marginal ulcer at her gastrojejunal anastomosis,09/07/13 at [facility]; she was seen and underwent:  EXPLORATORY LAPAROTOMY, EXPLORATORY CELIOTOMY WITH OR WITHOUT BIOPSY, REPAIR RECURRENT INCISIONAL OR VENTRAL HERNIA; REDUCIBLE GASTRORRHAPHY  SUTURE-PERFORATED ULCER/WOUND 09/07/13 Dell Ponto, MD.  We are ask to see again and evaluate for possible revision.    i have copied the note from [place] to another progress note to make it more accessible.     Objective: Vital signs in last 24 hours: Temp:  [97.5 F (36.4 C)-98.4 F (36.9 C)] 98 F (36.7 C) (12/17 0937) Pulse Rate:  [69-92] 70 (12/17 1220) Resp:  [12-25] 16 (12/17 1220) BP: (129-212)/(43-90) 199/84 mmHg (12/17 1416) SpO2:  [96 %-100 %] 97 % (12/17 1220) Weight:  [65.318 kg (144 lb)] 65.318 kg (144 lb) (12/17 0937) Last BM Date: 03/13/14 + BM Diet:  Clears She is on a nitroglycerine drip for BP control and her diastolic BP is around [id number] currently at rest, in no discomfort. Intake/Output from previous day: 12/16 0701 - 12/17 0700 In: 1998.5 [I.V.:1998.5] Out: 1875 [Urine:1875] Intake/Output this shift: Total I/O In: 470.4 [I.V.:470.4] Out: 225 [Urine:225]  General appearance: alert, cooperative and no distress Resp: clear to auscultation bilaterally Cardio: regular rate and rhythm, S1, S2 normal, no murmur, click, rub or gallop GI: soft, non-tender; bowel sounds normal; no masses,  no organomegaly and well healed midline incision. Extremities: extremities normal, atraumatic, no cyanosis or edema  Lab Results:   Recent Labs  03/18/14 0615 03/19/14 0520  WBC 5.5 5.2  HGB 10.1* 9.7*  HCT 29.0* 27.6*  PLT 268 309    BMET  Recent Labs  03/18/14 0615 03/19/14 0520  NA 126* 132*  K 4.3 4.0  CL 93* 100  CO2 20 19  GLUCOSE 116* 120*  BUN 7 6  CREATININE 0.96 0.94  CALCIUM 9.4 9.3   PT/INR No results for input(s): LABPROT, INR in the last 72 hours.   Recent Labs Lab 03/12/14 1808 03/13/14 0555 03/16/14 0432  AST 26 21 19   ALT 16 13 12   ALKPHOS 77 60 74  BILITOT 0.7 0.6 0.6  PROT 8.3 6.7 7.6  ALBUMIN 4.4 3.4* 3.6     Lipase     Component Value Date/Time   LIPASE 49 03/12/2014 1808     Studies/Results: No  results found.  Medications: . antiseptic oral rinse  7 mL Mouth Rinse q12n4p  . chlorhexidine  15 mL Mouth Rinse BID  . cycloSPORINE  1 drop Both Eyes BID  . hydrALAZINE  10 mg Intravenous 4 times per day  . labetalol  20 mg Intravenous Q6H  . levothyroxine  87.5 mcg Intravenous Daily  . metoCLOPramide (REGLAN) injection  10 mg Intravenous 3 times per day  . pantoprazole (PROTONIX) IV  40 mg Intravenous Q12H   . nitroGLYCERIN 45 mcg/min (03/19/14 1500)  . sodium chloride 0.9 % 1,000 mL with potassium chloride 20 mEq infusion 50 mL/hr at 03/19/14 1049    Assessment/Plan 1.  Gastric outlet obstruction 2.  12 x 10 x 15 cm gastric bezoar, found in Oct. 2015/EGD/Pulverization, 01/19/14. 3.  Chest pain  4.  Uncontrolled hypertension 5.  Prior gastrojejunostomy for PUD, date uncertain.  6.  Anastomotic ulcer of GJ with  EXPLORATORY LAPAROTOMY, EXPLORATORY  CELIOTOMY WITH OR WITHOUT BIOPSY, REPAIR RECURRENT  INCISIONAL OR VENTRAL HERNIA; REDUCIBLE  GASTRORRHAPHY SUTURE-PERFORATED ULCER/WOUND  09/07/13 Dell Ponto, MD. At [facility]. 7.  Paraesophageal hernia 8.  Hypothyroid 9.  Hx of stroke 10.Hx of seizures 11.  AODM 12.  Possible distal esophageal perforation ruled out on UGI   Plan:  Dr. Zella Richer will have to review all of this but she may be best served by returning to [facility] where they did her last surgery in June, if this cannot be managed medically here.      LOS: 7 days    Mylo Driskill 03/19/2014

## 2014-03-19 NOTE — Anesthesia Postprocedure Evaluation (Signed)
Anesthesia Post Note  Patient: Shannon [name]  Procedure(s) Performed: Procedure(s) (LRB): ESOPHAGOGASTRODUODENOSCOPY (EGD) (N/A)  Anesthesia type: MAC  Patient location: PACU  Post pain: Pain level controlled  Post assessment: Post-op Vital signs reviewed  Last Vitals: BP 192/77 mmHg  Pulse 70  Temp(Src) 36.7 C (Oral)  Resp 16  Ht 5\' 6"  (1.676 m)  Wt 144 lb (65.318 kg)  BMI 23.25 kg/m2  SpO2 97%  Post vital signs: Reviewed  Level of consciousness: awake  Complications: No apparent anesthesia complications

## 2014-03-19 NOTE — Transfer of Care (Signed)
Immediate Anesthesia Transfer of Care Note  Patient: Shannon [name]  Procedure(s) Performed: Procedure(s): ESOPHAGOGASTRODUODENOSCOPY (EGD) (N/A)  Patient Location: PACU and Endoscopy Unit  Anesthesia Type:MAC  Level of Consciousness: awake, alert , oriented and patient cooperative  Airway & Oxygen Therapy: Patient Spontanous Breathing and Patient connected to nasal cannula oxygen  Post-op Assessment: Report given to PACU RN and Post -op Vital signs reviewed and stable  Post vital signs: Reviewed and stable  Complications: No apparent anesthesia complications

## 2014-03-19 NOTE — Op Note (Addendum)
[place], [id number]   ENDOSCOPY PROCEDURE REPORT  PATIENT: Shannon, [name]  MR#: [id number] BIRTHDATE: 1924-03-22 , 89  yrs. old GENDER: female ENDOSCOPIST: Milus Banister, MD PROCEDURE DATE:  03/19/2014 PROCEDURE:  EGD, diagnostic ASA CLASS:     Class IV INDICATIONS:  clinical gastric outlet obstruction. MEDICATIONS: Monitored anesthesia care TOPICAL ANESTHETIC: none  DESCRIPTION OF PROCEDURE: After the risks benefits and alternatives of the procedure were thoroughly explained, informed consent was obtained.  The Pentax Gastroscope [id number] endoscope was introduced through the mouth and advanced to the second portion of the duodenum , Without limitations.  The instrument was slowly withdrawn as the mucosa was fully examined.  There was a large amount of thin fluid in her stomach, approximately 500cc.  This was suctioned out with endoscope.  The distal gastric anatomy (prepylorus, pylorus) was very tortuous, narrowed without clear strictures or ulcers.  I was able to advance the adult gastroscope through this region with only minimal pressure. The mucosa is normal through the tortuous region.  The duodenal bulb and more distal duodenum appeared normal.  Retroflexed views revealed no abnormalities.     The scope was then withdrawn from the patient and the procedure completed.  COMPLICATIONS: There were no immediate complications.  ENDOSCOPIC IMPRESSION: There was a large amount of thin fluid in her stomach, approximately 500cc.  This was suctioned out with endoscope.  The distal gastric anatomy (prepylorus, pylorus) was very tortuous, narrowed without clear strictures or ulcers.  I was able to advance the adult gastroscope through this region with only minimal pressure.  The mucosa is normal through the tortuous region. The duodenal bulb and more distal duodenum appeared normal  RECOMMENDATIONS: Will discuss these findings with Dr.   Henrene Pastor and will have surgery team see her again.  I feel that her distorted distal gastric anatomy is the causing at least partial gastric outlet obstruction. Treatment options include surgical revision, endoscopic stenting (not usually done for benign disease however).  For now, will restart clear liquids and add 5mg  reglan every four hours.   eSigned:  Milus Banister, MD 03/19/2014 12:14 PM Revised: 03/19/2014 12:14 PM   CC: Scarlette Shorts, MD

## 2014-03-19 NOTE — Progress Notes (Signed)
PROGRESS NOTE  Shannon [name] ZOX:[id number] DOB: [birth year] DOA: 03/12/2014 PCP: Oneal Grout, MD   Brief history 80 y.o. female with remote gastro-jejunostomy for peptic ulcer disease. She unfortunately had a perforation of an anastomotic ulcer at the gastro-jejunal anastomosis in June 2015 and underwent surgery at [facility].Patient was hospitalized here in October 2015 with abdominal pain nausea and vomiting and had endoscopy done twice during that admission with finding of a gastric bezoar.She was discharged home on a pured diet and PPI therapy.She unfortunately present on 12/10 with abd pain/vomiting, CT scan of abdomen showed persistent marked thickening of the pyloric channel and prepyloric antrum and a large filling defect in the stomach measuring 12 x 10 x 15 cm consistent with a bezoar.She was admitted for further evaluation and treatment. GI and Gen Surgery have been consulted.  Surgery did not feel that there was any indication for surgical intervention at this time. GI plans EGD when the patient is stable medically, hopefully 03/18/2014  Assessment/Plan: Gastric Outlet Obstruction: -secondary to Bezoar most likely   -Initially some question of perforation at GE junction on CT Scan, but this was ruled out on Barium Esophagogram.  -continues to feel floated/belching-having chest pain-suspect from upward pressure from outlet obstruction- -NGT for decompression--removed 03/17/14-->CLD allowed X1 and now NPO again waiting for EGD by GI -will follow rec's -EGD is planned for today 12/17  HTN Urgency -Patient was transferred to SDU on 03/16/14 and started on NTG drip -BP still elevated -will continue hydralazine -patient labetalol titrated up -will titrate off NTG drip as tolerated -appreciated cardiology rec's -BP will be easier to control once able to take PO's  Atypical Chest pain  -Troponins negative 3  -EKG with nonspecific changes -most likely secondary to  GERD and hypertensive urgency -continue PPI and BP control   Diabetes mellitus type 2 -12/24/2013 hemoglobin A1c 6.9 -Given the patient's age and current recommendations, will allow for lobar SEMEN control -will monitor for now -low carb diet when able to tolerate PO's  hyponatremia  -Secondary to hypotonic fluid given on admission  -continue normal saline   CKD stage 3 -baseline creatinine 0.9-1.3 -appears to remained stable/at baseline currently -will monitor  hypokalemia -repletion/maintenance on IVF  -MG level WNL  Hypothyroidism: -continue with IV Levothyroxine.   Hx CVA ASA Suppositories until able to take po No new deficits appreciated   Depression:  -since NPO-all meds on hold-resume when able to tolerate by mouth meds  Remote hx of Gastrojejunostomy with perforation of anastomotic ulcer in June 2015-s/p revision/laparotomy   Family Communication:   Pt at beside Disposition Plan:   Home when medically stable      Procedures/Studies: Ct Abdomen Pelvis Wo Contrast  03/12/2014   CLINICAL DATA:  Constant vomiting today, constipated since Saturday, RIGHT lower quadrant rectal pain, history diabetes, hypertension, prior gastric bezoar  EXAM: CT ABDOMEN AND PELVIS WITHOUT CONTRAST  TECHNIQUE: Multidetector CT imaging of the abdomen and pelvis was performed following the standard protocol without IV contrast. Sagittal and coronal MPR images reconstructed from axial data set.  COMPARISON:  03/07/2014  FINDINGS: Minimal bibasilar atelectasis.  Low-attenuation foci within liver, question cysts, unchanged.  Metallic foreign body at LEFT ventricle again identified, uncertain etiology.  Gallbladder surgically absent.  Probable tiny LEFT renal cyst 9 mm diameter.  Within limits of a nonenhanced exam, remainder of liver, spleen, pancreas, kidneys and adrenal glands normal appearance.  Persistent marked thickening of the pyloric channel  and pre pyloric antrum gastric distention  question partial gastric outlet obstruction.  Small amount of ingested contrast is present within proximal small bowel loops.  Large filling defect within the stomach, 12.2 x 10.0 x 15.2 cm, containing bubbly gas, consistent with recurrent bezoar.  Scattered colonic diverticula.  No evidence of bowel obstruction or dilatation.  Extensive atherosclerotic calcifications aorta and minimally in coronary arteries.  High attenuation nodule 13 mm diameter again identified adjacent to the thickened gastroesophageal junction, unchanged but since 03/07/2014 but new since 01/17/2014, question contrast within epiphrenic diverticulum or small focal contained perforation, unlikely calcified lymph node since this is new in past 2 months.  Unremarkable bladder, ureters, uterus and adnexae.  Appendix not visualized.  No definite rectal wall thickening.  No additional mass, adenopathy, free fluid, free air or abscess.  Bones demineralized with scattered degenerative changes.  Surgical scar at ventral abdomen with a small incisional hernia containing a portion of transverse colon.  IMPRESSION: Thickened pylorus and pre-pyloric antrum likely causing partial outlet obstruction.  Large filling defect within stomach consistent with recurrent bezoar 12.2 x 10.0 x 15.2 cm.  Colonic diverticulosis.  Suspected hepatic cysts unchanged.  13 mm high attenuation nodule adjacent to thickened gastroesophageal junction, question contained perforation versus epiphrenic diverticulum containing contrast, new since 01/17/2014 but unchanged since 03/07/2014.  Small incisional hernia containing a portion of the transverse colon wall without obstruction.   Electronically Signed   By: Ulyses Southward M.D.   On: 03/12/2014 23:12   Ct Abdomen Pelvis W Contrast  03/07/2014   CLINICAL DATA:  80 year old female presenting with rectal pressure gradually worsening over the prior month.  EXAM: CT ABDOMEN AND PELVIS WITH CONTRAST  TECHNIQUE: Multidetector CT imaging  of the abdomen and pelvis was performed using the standard protocol following bolus administration of intravenous contrast.  CONTRAST:  50mL OMNIPAQUE IOHEXOL 300 MG/ML SOLN, 80mL OMNIPAQUE IOHEXOL 300 MG/ML SOLN  COMPARISON:  CT of the abdomen and pelvis 01/17/2014.  FINDINGS: Lower chest: Cardiomegaly with biatrial dilatation. Calcifications of the aortic valve. Atherosclerotic calcifications in the left main and left circumflex coronary arteries. Small hiatal hernia.  Hepatobiliary: Multifocal low-attenuation lesions appear unchanged in size, number and distribution scattered throughout the liver. Many these are too small to definitively characterize, while the larger lesions are intermediate attenuation, favored to represent proteinaceous cysts. The largest of these lesions measures only 12 mm in segment 2 (image 21 of series 2). No new hepatic lesions are noted. No intra or extrahepatic biliary ductal dilatation. Status post cholecystectomy.  Pancreas: Unremarkable.  Spleen: Unremarkable.  Adrenals/Urinary Tract: Again noted is a 1.2 cm intermediate attenuation (23 HU) lesion in the lower pole of the left kidney, with some surrounding architectural distortion, favored to represent a previously ruptured proteinaceous cyst. No suspicious renal lesions are noted. Multifocal cortical thinning in the kidneys bilaterally, presumably mild scarring. No hydroureteronephrosis. Urinary bladder is normal in appearance. Bilateral adrenal glands are normal in appearance.  Stomach/Bowel: As with the prior examination there is extensive thickening of the gastric wall in the antral pre-pyloric region of the stomach. The pylorus is also markedly thickened (up to 15 mm in thickness). The stomach is moderately distended. No pathologic dilatation of small bowel or colon. Postoperative changes of partial colectomy near the rectosigmoid junction are noted. A few scattered colonic diverticulae are noted, without surrounding  inflammatory changes to suggest an acute diverticulitis at this time. The distal rectum and anus are grossly unremarkable in appearance.  Vascular/Lymphatic: Extensive atherosclerosis throughout  the abdominal and pelvic vasculature, without evidence of aneurysm or dissection. No lymphadenopathy noted in the abdomen or pelvis.  Reproductive: Retroverted uterus.  Ovaries are atrophic.  Other: No significant volume of ascites.  No pneumoperitoneum.  Musculoskeletal: Tiny ventral hernia, through which there is incomplete herniation of the mid transverse colon; no findings to suggest associated bowel incarceration or obstruction at this time. Old compression fracture of superior endplate of T11 with approximately 20% loss of anterior vertebral body height (unchanged). There are no aggressive appearing lytic or blastic lesions noted in the visualized portions of the skeleton.  IMPRESSION: 1. No findings to account for the patient's worsening rectal pressure. 2. There is persistent thickening of the pylorus and antral pre-pyloric region of the stomach, which could be inflammatory in the setting of ulcer disease, or could be neoplastic. There is persistent moderate dilatation of the stomach, suggesting some degree of gastric outlet obstruction. Correlation with results of recent upper endoscopy is suggested. 3. Additional incidental findings, as above, similar to prior examination.   Electronically Signed   By: Trudie Reed M.D.   On: 03/07/2014 16:28   Dg Chest Port 1 View  03/14/2014   CLINICAL DATA:  80 year old female with new onset chest pain beginning today  EXAM: PORTABLE CHEST - 1 VIEW  COMPARISON:  None.  FINDINGS: Cardiomegaly. Atherosclerotic calcification present in the transverse aorta. No focal airspace consolidation, pulmonary edema, pneumothorax. a query trace left pleural effusion. No acute osseous abnormality. The left glenohumeral joint osteoarthritis. Mild coarsening of the interstitial markings.   IMPRESSION: 1. Query trace left pleural effusion. 2. Cardiomegaly. 3. Aortic atherosclerosis.   Electronically Signed   By: Malachy Moan M.D.   On: 03/14/2014 10:12   Dg Abd Portable 1v  03/15/2014   CLINICAL DATA:  Nasogastric tube placement.  EXAM: PORTABLE ABDOMEN - 1 VIEW  COMPARISON:  03/14/2014  FINDINGS: The nasogastric tube is in place, tip coiled within the stomach and tip overlying the region of the fundus. Surgical clips are noted in the right upper quadrant. There is residual contrast in the colon and distal small bowel following esophagram on 03/13/2014. Bowel gas pattern is nonobstructive.  IMPRESSION: Nasogastric tube coiled within the stomach.   Electronically Signed   By: Rosalie Gums M.D.   On: 03/15/2014 10:49   Dg Abd Portable 2v  03/14/2014   CLINICAL DATA:  80 year old female with persistent abdominal pain and fullness.  EXAM: PORTABLE ABDOMEN - 2 VIEW  COMPARISON:  Esophagram performed yesterday. CT abdomen/ pelvis 03/12/2014  FINDINGS: No evidence of free air on the lateral decubitus view. Contrast material is noted within the stomach and scattered throughout the large bowel. There is no evidence of obstruction. The volume of contrast material remaining within the stomach is greater than expected. The bones are diffusely osteopenic. Surgical clips in the right upper quadrant suggest prior cholecystectomy.  IMPRESSION: 1. No evidence of obstruction or free air. 2. Greater than expected volume of retained oral contrast within the stomach. Query clinical history of delayed gastric emptying or gastroparesis? 3. The bones appear osteopenic.   Electronically Signed   By: Malachy Moan M.D.   On: 03/14/2014 10:04   Dg Esophagus W/water Sol Cm  03/13/2014   CLINICAL DATA:  Abnormal CT, evaluate for esophageal perforation  EXAM: ESOPHOGRAM/BARIUM SWALLOW  TECHNIQUE: Single contrast examination was performed using water-soluble contrast.  FLUOROSCOPY TIME:  1 min 7 seconds   COMPARISON:  CT abdomen pelvis dated 03/12/2014 and 03/07/2014  FINDINGS: Esophageal  dysmotility.  No fixed esophageal narrowing/stricture.  Suspected small paraesophageal hernia (less likely hiatal hernia or esophageal diverticulum) at the GE junction. The appearance does not suggest a contained esophageal perforation.  Presence of gastroesophageal reflux could not be assessed due to the patient's inability to clear her esophagus.  Moderate food/debris in the stomach.  IMPRESSION: Esophageal dysmotility.  No fixed esophageal narrowing/stricture.  Suspected small paraesophageal hernia at the GE junction. No evidence of contained esophageal perforation.  Moderate food/debris in the stomach.   Electronically Signed   By: Charline Bills M.D.   On: 03/13/2014 09:44     Subjective: Patient with some chest discomfort overnight and continue to experience difficulties controlling BP. No CP or SOB at this moment. Sodium 132.   Objective: Filed Vitals:   03/19/14 0830 03/19/14 0840 03/19/14 0845 03/19/14 0937  BP:    204/73  Pulse: 87 82 87 76  Temp:    98 F (36.7 C)  TempSrc:    Oral  Resp: 16 18 17 16   Height:    5\' 6"  (1.676 m)  Weight:    65.318 kg (144 lb)  SpO2: 99% 99% 100% 100%    Intake/Output Summary (Last 24 hours) at 03/19/14 1045 Last data filed at 03/19/14 0600  Gross per 24 hour  Intake 1735.31 ml  Output   1875 ml  Net -139.69 ml   Weight change:  Exam:   General:  Pt is alert, follows commands appropriately, not in acute distress. Denies CP currently; but expressed having CP and mild SOB overnight  HEENT: No icterus, No thrush,[place]/AT  Cardiovascular: RRR, S1/S2, no rubs, no gallops  Respiratory: CTA bilaterally, no wheezing, no crackles, no rhonchi  Abdomen: Soft/+BS, non tender, non distended, no guarding  Extremities: No edema, No lymphangitis, No petechiae, No rashes, no synovitis  Data Reviewed: Basic Metabolic Panel:  Recent Labs Lab 03/13/14 0555  03/16/14 0432 03/17/14 0418 03/18/14 0615 03/19/14 0520  NA 132* 130* 125* 126* 132*  K 4.3 3.8 3.5* 4.3 4.0  CL 98 95* 92* 93* 100  CO2 22 19 20 20 19   GLUCOSE 105* 134* 179* 116* 120*  BUN 16 12 9 7 6   CREATININE 1.21* 1.04 0.97 0.96 0.94  CALCIUM 9.4 10.0 9.3 9.4 9.3  MG  --   --   --  1.5  --    Liver Function Tests:  Recent Labs Lab 03/12/14 1808 03/13/14 0555 03/16/14 0432  AST 26 21 19   ALT 16 13 12   ALKPHOS 77 60 74  BILITOT 0.7 0.6 0.6  PROT 8.3 6.7 7.6  ALBUMIN 4.4 3.4* 3.6    Recent Labs Lab 03/12/14 1808  LIPASE 49   CBC:  Recent Labs Lab 03/12/14 1808 03/13/14 0555 03/16/14 0432 03/17/14 0418 03/18/14 0615 03/19/14 0520  WBC 5.5 6.2 6.5 6.1 5.5 5.2  NEUTROABS 3.7 4.1  --   --   --   --   HGB 12.0 10.1* 12.7 10.1* 10.1* 9.7*  HCT 34.7* 28.8* 36.8 28.7* 29.0* 27.6*  MCV 89.0 87.5 89.1 87.0 88.1 88.5  PLT 289 240 307 298 268 309   Cardiac Enzymes:  Recent Labs Lab 03/14/14 1337 03/14/14 1808 03/16/14 0805 03/16/14 1405 03/16/14 2100  TROPONINI <0.30 <0.30 <0.30 <0.30 <0.30   BNP: Invalid input(s): POCBNP CBG:  Recent Labs Lab 03/17/14 0032 03/17/14 0453 03/17/14 0743 03/17/14 1325 03/17/14 1612  GLUCAP 164* 157* 169* 121* 120*    Recent Results (from the past 240 hour(s))  Urine  culture     Status: None   Collection Time: 03/12/14  7:47 PM  Result Value Ref Range Status   Specimen Description URINE, RANDOM  Final   Special Requests NONE  Final   Culture  Setup Time   Final    03/13/2014 01:31 Performed at [facility] Count   Final    50,000 COLONIES/ML Performed at [facility]    Culture   Final    Multiple bacterial morphotypes present, none predominant. Suggest appropriate recollection if clinically indicated. Performed at [facility]    Report Status 03/13/2014 FINAL  Final  MRSA PCR Screening     Status: None   Collection Time: 03/16/14 12:36 PM  Result Value Ref Range  Status   MRSA by PCR NEGATIVE NEGATIVE Final    Comment:        The GeneXpert MRSA Assay (FDA approved for NASAL specimens only), is one component of a comprehensive MRSA colonization surveillance program. It is not intended to diagnose MRSA infection nor to guide or monitor treatment for MRSA infections.      Scheduled Meds: . antiseptic oral rinse  7 mL Mouth Rinse q12n4p  . chlorhexidine  15 mL Mouth Rinse BID  . hydrALAZINE  10 mg Intravenous 4 times per day  . labetalol  20 mg Intravenous Q6H  . levothyroxine  87.5 mcg Intravenous Daily  . pantoprazole (PROTONIX) IV  40 mg Intravenous Q12H   Continuous Infusions: . nitroGLYCERIN 40 mcg/min (03/19/14 0600)  . sodium chloride 0.9 % 1,000 mL with potassium chloride 20 mEq infusion 75 mL/hr at 03/19/14 0600     Time: 30 minutes  Vassie Loll, MD [facility] Pager [phone]  If 7PM-7AM, please contact night-coverage [email or web address] Password [email or web address] 03/19/2014, 10:45 AM   LOS: 7 days

## 2014-03-19 NOTE — Progress Notes (Signed)
Per Dr. Judson Roch the nitroglycerin was increased to 45 mcg.

## 2014-03-19 NOTE — Progress Notes (Signed)
EXPLORATORY LAPAROTOMY, EXPLORATORY CELIOTOMY WITH OR WITHOUT BIOPSY(S) REPAIR RECURRENT INCISIONAL OR VENTRAL HERNIA; REDUCIBLE GASTRORRHAPHY SUTURE-PERFORATED ULCER/WOUND 09/07/13 Dell Ponto, MD Op Note - Dell Ponto, MD - 09/07/2013 12:00 AM EDT Preoperative Diagnosis:  Peritonitis.  Postoperative Diagnosis:  Perforated marginal ulcer at her gastrojejunal anastomosis.  Procedure(s) Performed:  1. Exploratory laparotomy. 2. Resection of a gastrojejunal anastomosis with primary jejunojejunal anastomosis (repair of gastric ulcer). 3. Repair of recurrent incisional hernia. 4. Esophagogastroduodenoscopy.   Teaching Surgeon: Rivka Barbara, MD  Resident Surgeon: Vivianne Spence, MD  Anesthesia: General.  Specimens: Gastrojejunal anastomosis.   Complications: None.   Indications for Surgery: This is an 80 year old female who was transferred to [place] for evaluation of an incarcerated recurrent incisional hernia. On our exam, the hernia was very reducible, but she had significant abdominal pain and tenderness.  On exam, she had peritonitis. On review of her prior imaging, there was no clear cause for her peritonitis. We had a long discussion with the patient, but given that she had a leucocytosis and peritonitis on exam, that we recommended exploratory laparotomy to identify the causes. We did not suspect gastrojejunal ulceration, but had discussed with her the possibilities of bowel obstruction with closed loop and mesenteric ischemia. We recommended to her that she undergo exploratory laparotomy to identify the cause of her peritonitis and alleviate it. We had a long discussion about end of life activities given that she is 31. She is fairly functional at home and requests lifesaving interventions.  Procedure: The patient was brought to the operating room and placed in supine position. General anesthesia was induced. The abdomen was prepped and draped in a sterile  fashion. Her previous midline laparotomy was opened. She had a 6 cm recurrent incisional hernia that we identified. We used this to enter the peritoneal cavity. We extended it cephalad and caudad until her midline was opened. Adhesiolysis was performed until everything was mobilized. The transverse colon was adherent to the hernia sac. We dissected the transverse colon out. We then examined the small bowel from the ligament of Treitz all the way to the ileocecal junction. She had a gastrojejunal anastomosis about 25-30 cm distal to ligament of Treitz. There was fibrinous exudate here and this was perforated. This was an anterior perforation of the gastrojejunal anastomosis. We thoroughly examined the remainder of her small bowel and found no other abnormalities. Her right colon, transverse colon, descending colon, sigmoid colon and rectum were all fine. There was some dense scar tissue down by the sigmoid. Actually during the mobilization of her small bowel, we had to dissect these adhesions free to allow [place] to mobilize small bowel. It was an antecolic gastrojejunal anastomosis. We divided the jejunum, a few centimeters proximal and a few cm distal to this and divided the mesentery with ties. We then used a TA 60 to divide the stomach a cm or so proximal to the perforated ulcer on healthy stomach. While mobilizing the stomach to find a good place to revise her gastrojejunal anastomosis, we noted that the duodenum appeared to be in continuity with the stomach. This is somewhat unusual given that she had a gastrojejunostomy. We attempted an EGD to see if this was patent. We traversed the pylorus into the duodenum ensuring that it was patent. There was some debate as to whether or not we had confirmed this with our EGD, so we made a small gastrotomy and did confirm that the pylorus was patent. We then closed the pylorus with a 2 layered  closure. A side-to-side jejunojejunal anastomosis was  then performed about 25 cm distal to ligament of Treitz restoring intestinal continuity.  A thorough irrigation of the abdomen and pelvis were then performed. So at the end of the case, her stomach was in direct continuity with her duodenum and jejunum, jejunojejunal anastomosis had been performed. The mesentery of this was closed.  Also, of note, when we had made a small gastrotomy, we passed the NG tube through the stomach down to our jejunojejunal anastomosis to ensure that it truly was patent. We then removed this prior our closing the gastrostomy. We resected the hernia sac. Of note, a wound protector had been used prior to this point. We then performed a primary closure of the fascia and the hernia. Due to the contaminated case with perforated gastrojejunal anastomosis, we left the skin open and placed a vacuum-assisted closure device.   In summary, the patient was explored for peritonitis and was found to have a perforated gastrojejunal ulcer. The gastrojejunal anastomosis was resected and a jejuno-jenunostomy was created.

## 2014-03-19 NOTE — Progress Notes (Addendum)
    Subjective:  Had CP last night when SBP was > 200 mmHg. C/o cough this am but no other complaints. No chest pain or pressure today. No shortness of breath.  Objective:  Vital Signs in the last 24 hours: Temp:  [97.8 F (36.6 C)-98.4 F (36.9 C)] 97.9 F (36.6 C) (12/17 0345) Pulse Rate:  [69-83] 75 (12/17 0700) Resp:  [12-25] 12 (12/17 0700) BP: (129-212)/(49-90) 212/83 mmHg (12/17 0345) SpO2:  [96 %-100 %] 99 % (12/17 0700)  Intake/Output from previous day: 12/16 0701 - 12/17 0700 In: 1998.5 [I.V.:1998.5] Out: 1875 [Urine:1875]  Physical Exam: Pt is alert and oriented, elderly woman in NAD HEENT: normal Neck: JVP - normal Lungs: CTA bilaterally CV: RRR without murmur or gallop Abd: soft, + BS Ext: no C/C/E Skin: warm/dry no rash   Lab Results:  Recent Labs  03/18/14 0615 03/19/14 0520  WBC 5.5 5.2  HGB 10.1* 9.7*  PLT 268 309    Recent Labs  03/18/14 0615 03/19/14 0520  NA 126* 132*  K 4.3 4.0  CL 93* 100  CO2 20 19  GLUCOSE 116* 120*  BUN 7 6  CREATININE 0.96 0.94    Recent Labs  03/16/14 1405 03/16/14 2100  TROPONINI <0.30 <0.30    Tele: Personally reviewed. Sinus rhythm  Assessment/Plan:  Hypertensive Emergency: BP has been hard to control while she is NPO. Home regimen is Imdur/Hydralazine/Toprol XL. Will continue IV NTG, increase labetolol, and continue scheduled IV hydralazine. For EGD today. Will sign off please call if we can be of any further help.  Sherren Mocha, M.D. 03/19/2014, 8:02 AM

## 2014-03-20 ENCOUNTER — Encounter (HOSPITAL_COMMUNITY): Payer: Self-pay | Admitting: Gastroenterology

## 2014-03-20 DIAGNOSIS — R109 Unspecified abdominal pain: Secondary | ICD-10-CM

## 2014-03-20 MED ORDER — CLONIDINE ORAL SUSPENSION 10 MCG/ML
0.2000 mg | Freq: Two times a day (BID) | ORAL | Status: DC
  Filled 2014-03-20 (×2): qty 20

## 2014-03-20 MED ORDER — ACETAMINOPHEN 650 MG RE SUPP: 650.0000 mg | Freq: Four times a day (QID) | RECTAL | Status: DC | PRN

## 2014-03-20 MED ORDER — ACETAMINOPHEN 160 MG/5ML PO SOLN
650.0000 mg | Freq: Four times a day (QID) | ORAL | Status: DC | PRN
  Administered 2014-03-25: 650 mg via ORAL
  Filled 2014-03-20: qty 20.3

## 2014-03-20 MED ORDER — CLONIDINE ORAL SUSPENSION 10 MCG/ML
0.2000 mg | Freq: Three times a day (TID) | ORAL | Status: DC
  Administered 2014-03-20 – 2014-03-26 (×18): 0.2 mg via ORAL
  Filled 2014-03-20 (×23): qty 20

## 2014-03-20 MED ORDER — ASPIRIN 300 MG RE SUPP
300.0000 mg | Freq: Every day | RECTAL | Status: DC
  Administered 2014-03-20 – 2014-03-26 (×6): 300 mg via RECTAL
  Filled 2014-03-20 (×7): qty 1

## 2014-03-20 NOTE — Progress Notes (Signed)
PROGRESS NOTE  Shannon [name] ZOX:[id number] DOB: 1923-07-04 DOA: 03/12/2014 PCP: Oneal Grout, MD   Brief history 80 y.o. female with remote gastro-jejunostomy for peptic ulcer disease. She unfortunately had a perforation of an anastomotic ulcer at the gastro-jejunal anastomosis in June 2015 and underwent surgery at [facility].Patient was hospitalized here in October 2015 with abdominal pain nausea and vomiting and had endoscopy done twice during that admission with finding of a gastric bezoar.She was discharged home on a pured diet and PPI therapy.She unfortunately present on 12/10 with abd pain/vomiting, CT scan of abdomen showed persistent marked thickening of the pyloric channel and prepyloric antrum and a large filling defect in the stomach measuring 12 x 10 x 15 cm consistent with a bezoar.She was admitted for further evaluation and treatment. GI and Gen Surgery have been consulted; will follow rec's. Course complicated with hypertensive urgency and patient is requiring NTG drip. BP even better, still elevated.  Assessment/Plan: Gastric Outlet Obstruction: -secondary to Bezoar most likely   -Initially some question of perforation at GE junction on CT Scan, but this was ruled out on Barium Esophagogram.  -continues to feel bloated/belching-having chest pain-suspect from upward pressure from outlet obstruction- troponin neg X5 -NGT for decompression--removed 03/17/14-- no nausea or vomiting. (But reports some discomfort in her epigastric/LUQ area) -EGD demonstrated incomplete gastric outlet obstruction from distal tortuous/anatomically abnormal distal stomach from surgery (gastrojejunostomy). Plan is to try full liquid diet with reglan -surgery consulted and will follow rec's (Family do not want any more surgeries)   HTN Urgency -Patient was transferred to SDU on 03/16/14 and started on NTG drip -BP still elevated and requiring NTG drip -will continue schedule hydralazine,  continue labetalol 20mg  Q6H and will also add clonidine (solution TID; as patient is able to tolerate clear liquid) -will titrate off NTG drip as tolerated -appreciated cardiology rec's -BP will be easier to control once able to take PO meds.  Atypical Chest pain  -Troponins negative 3  -EKG with nonspecific changes -most likely secondary to GERD and hypertensive urgency -continue PPI and BP control   Diabetes mellitus type 2 -12/24/2013 hemoglobin A1c 6.9 -Given the patient's age and current recommendations, will allow for lobar SEMEN control -will monitor for now -low carb diet when able to tolerate PO's  hyponatremia  -Secondary to hypotonic fluid given on admission  -improved/resolved with IVF's -will change IVF's to [facility] now  CKD stage 3 -baseline creatinine 0.9-1.3 -appears to remained stable/at baseline currently -will monitor  hypokalemia -repletion/maintenance on IVF  -MG level WNL  Hypothyroidism: -continue with IV Levothyroxine for now.   Hx CVA ASA Suppositories until able to fully take po No new deficits appreciated   Depression:  -since NPO-all meds on hold-resume when able to fully tolerate by mouth meds  Remote hx of Gastrojejunostomy with perforation of anastomotic ulcer in June 2015-s/p revision/laparotomy -general surgery consulted -plan is for small bowel follow through x-ray -continue liquid diet for now and use of reglan    Family Communication:   Pt at beside Disposition Plan:   Home when medically stable   Procedures/Studies: Ct Abdomen Pelvis Wo Contrast  03/12/2014   CLINICAL DATA:  Constant vomiting today, constipated since Saturday, RIGHT lower quadrant rectal pain, history diabetes, hypertension, prior gastric bezoar  EXAM: CT ABDOMEN AND PELVIS WITHOUT CONTRAST  TECHNIQUE: Multidetector CT imaging of the abdomen and pelvis was performed following the standard protocol without IV contrast. Sagittal and coronal MPR  images reconstructed  from axial data set.  COMPARISON:  03/07/2014  FINDINGS: Minimal bibasilar atelectasis.  Low-attenuation foci within liver, question cysts, unchanged.  Metallic foreign body at LEFT ventricle again identified, uncertain etiology.  Gallbladder surgically absent.  Probable tiny LEFT renal cyst 9 mm diameter.  Within limits of a nonenhanced exam, remainder of liver, spleen, pancreas, kidneys and adrenal glands normal appearance.  Persistent marked thickening of the pyloric channel and pre pyloric antrum gastric distention question partial gastric outlet obstruction.  Small amount of ingested contrast is present within proximal small bowel loops.  Large filling defect within the stomach, 12.2 x 10.0 x 15.2 cm, containing bubbly gas, consistent with recurrent bezoar.  Scattered colonic diverticula.  No evidence of bowel obstruction or dilatation.  Extensive atherosclerotic calcifications aorta and minimally in coronary arteries.  High attenuation nodule 13 mm diameter again identified adjacent to the thickened gastroesophageal junction, unchanged but since 03/07/2014 but new since 01/17/2014, question contrast within epiphrenic diverticulum or small focal contained perforation, unlikely calcified lymph node since this is new in past 2 months.  Unremarkable bladder, ureters, uterus and adnexae.  Appendix not visualized.  No definite rectal wall thickening.  No additional mass, adenopathy, free fluid, free air or abscess.  Bones demineralized with scattered degenerative changes.  Surgical scar at ventral abdomen with a small incisional hernia containing a portion of transverse colon.  IMPRESSION: Thickened pylorus and pre-pyloric antrum likely causing partial outlet obstruction.  Large filling defect within stomach consistent with recurrent bezoar 12.2 x 10.0 x 15.2 cm.  Colonic diverticulosis.  Suspected hepatic cysts unchanged.  13 mm high attenuation nodule adjacent to thickened gastroesophageal junction, question  contained perforation versus epiphrenic diverticulum containing contrast, new since 01/17/2014 but unchanged since 03/07/2014.  Small incisional hernia containing a portion of the transverse colon wall without obstruction.   Electronically Signed   By: Ulyses Southward M.D.   On: 03/12/2014 23:12   Ct Abdomen Pelvis W Contrast  03/07/2014   CLINICAL DATA:  80 year old female presenting with rectal pressure gradually worsening over the prior month.  EXAM: CT ABDOMEN AND PELVIS WITH CONTRAST  TECHNIQUE: Multidetector CT imaging of the abdomen and pelvis was performed using the standard protocol following bolus administration of intravenous contrast.  CONTRAST:  50mL OMNIPAQUE IOHEXOL 300 MG/ML SOLN, 80mL OMNIPAQUE IOHEXOL 300 MG/ML SOLN  COMPARISON:  CT of the abdomen and pelvis 01/17/2014.  FINDINGS: Lower chest: Cardiomegaly with biatrial dilatation. Calcifications of the aortic valve. Atherosclerotic calcifications in the left main and left circumflex coronary arteries. Small hiatal hernia.  Hepatobiliary: Multifocal low-attenuation lesions appear unchanged in size, number and distribution scattered throughout the liver. Many these are too small to definitively characterize, while the larger lesions are intermediate attenuation, favored to represent proteinaceous cysts. The largest of these lesions measures only 12 mm in segment 2 (image 21 of series 2). No new hepatic lesions are noted. No intra or extrahepatic biliary ductal dilatation. Status post cholecystectomy.  Pancreas: Unremarkable.  Spleen: Unremarkable.  Adrenals/Urinary Tract: Again noted is a 1.2 cm intermediate attenuation (23 HU) lesion in the lower pole of the left kidney, with some surrounding architectural distortion, favored to represent a previously ruptured proteinaceous cyst. No suspicious renal lesions are noted. Multifocal cortical thinning in the kidneys bilaterally, presumably mild scarring. No hydroureteronephrosis. Urinary bladder is  normal in appearance. Bilateral adrenal glands are normal in appearance.  Stomach/Bowel: As with the prior examination there is extensive thickening of the gastric wall in the antral pre-pyloric region  of the stomach. The pylorus is also markedly thickened (up to 15 mm in thickness). The stomach is moderately distended. No pathologic dilatation of small bowel or colon. Postoperative changes of partial colectomy near the rectosigmoid junction are noted. A few scattered colonic diverticulae are noted, without surrounding inflammatory changes to suggest an acute diverticulitis at this time. The distal rectum and anus are grossly unremarkable in appearance.  Vascular/Lymphatic: Extensive atherosclerosis throughout the abdominal and pelvic vasculature, without evidence of aneurysm or dissection. No lymphadenopathy noted in the abdomen or pelvis.  Reproductive: Retroverted uterus.  Ovaries are atrophic.  Other: No significant volume of ascites.  No pneumoperitoneum.  Musculoskeletal: Tiny ventral hernia, through which there is incomplete herniation of the mid transverse colon; no findings to suggest associated bowel incarceration or obstruction at this time. Old compression fracture of superior endplate of T11 with approximately 20% loss of anterior vertebral body height (unchanged). There are no aggressive appearing lytic or blastic lesions noted in the visualized portions of the skeleton.  IMPRESSION: 1. No findings to account for the patient's worsening rectal pressure. 2. There is persistent thickening of the pylorus and antral pre-pyloric region of the stomach, which could be inflammatory in the setting of ulcer disease, or could be neoplastic. There is persistent moderate dilatation of the stomach, suggesting some degree of gastric outlet obstruction. Correlation with results of recent upper endoscopy is suggested. 3. Additional incidental findings, as above, similar to prior examination.   Electronically Signed    By: Trudie Reed M.D.   On: 03/07/2014 16:28   Dg Chest Port 1 View  03/14/2014   CLINICAL DATA:  80 year old female with new onset chest pain beginning today  EXAM: PORTABLE CHEST - 1 VIEW  COMPARISON:  None.  FINDINGS: Cardiomegaly. Atherosclerotic calcification present in the transverse aorta. No focal airspace consolidation, pulmonary edema, pneumothorax. a query trace left pleural effusion. No acute osseous abnormality. The left glenohumeral joint osteoarthritis. Mild coarsening of the interstitial markings.  IMPRESSION: 1. Query trace left pleural effusion. 2. Cardiomegaly. 3. Aortic atherosclerosis.   Electronically Signed   By: Malachy Moan M.D.   On: 03/14/2014 10:12   Dg Abd Portable 1v  03/15/2014   CLINICAL DATA:  Nasogastric tube placement.  EXAM: PORTABLE ABDOMEN - 1 VIEW  COMPARISON:  03/14/2014  FINDINGS: The nasogastric tube is in place, tip coiled within the stomach and tip overlying the region of the fundus. Surgical clips are noted in the right upper quadrant. There is residual contrast in the colon and distal small bowel following esophagram on 03/13/2014. Bowel gas pattern is nonobstructive.  IMPRESSION: Nasogastric tube coiled within the stomach.   Electronically Signed   By: Rosalie Gums M.D.   On: 03/15/2014 10:49   Dg Abd Portable 2v  03/14/2014   CLINICAL DATA:  80 year old female with persistent abdominal pain and fullness.  EXAM: PORTABLE ABDOMEN - 2 VIEW  COMPARISON:  Esophagram performed yesterday. CT abdomen/ pelvis 03/12/2014  FINDINGS: No evidence of free air on the lateral decubitus view. Contrast material is noted within the stomach and scattered throughout the large bowel. There is no evidence of obstruction. The volume of contrast material remaining within the stomach is greater than expected. The bones are diffusely osteopenic. Surgical clips in the right upper quadrant suggest prior cholecystectomy.  IMPRESSION: 1. No evidence of obstruction or free air. 2.  Greater than expected volume of retained oral contrast within the stomach. Query clinical history of delayed gastric emptying or gastroparesis? 3. The bones  appear osteopenic.   Electronically Signed   By: Malachy Moan M.D.   On: 03/14/2014 10:04   Dg Esophagus W/water Sol Cm  03/13/2014   CLINICAL DATA:  Abnormal CT, evaluate for esophageal perforation  EXAM: ESOPHOGRAM/BARIUM SWALLOW  TECHNIQUE: Single contrast examination was performed using water-soluble contrast.  FLUOROSCOPY TIME:  1 min 7 seconds  COMPARISON:  CT abdomen pelvis dated 03/12/2014 and 03/07/2014  FINDINGS: Esophageal dysmotility.  No fixed esophageal narrowing/stricture.  Suspected small paraesophageal hernia (less likely hiatal hernia or esophageal diverticulum) at the GE junction. The appearance does not suggest a contained esophageal perforation.  Presence of gastroesophageal reflux could not be assessed due to the patient's inability to clear her esophagus.  Moderate food/debris in the stomach.  IMPRESSION: Esophageal dysmotility.  No fixed esophageal narrowing/stricture.  Suspected small paraesophageal hernia at the GE junction. No evidence of contained esophageal perforation.  Moderate food/debris in the stomach.   Electronically Signed   By: Charline Bills M.D.   On: 03/13/2014 09:44     Subjective: Patient is stable and tolerating liquid diet. No CP or SOB. BP still elevated    Objective: Filed Vitals:   03/20/14 0600 03/20/14 0602 03/20/14 0648 03/20/14 0800  BP: 198/90 198/90 162/76 226/93  Pulse: 63  84 86  Temp:    98.2 F (36.8 C)  TempSrc:    Oral  Resp: 16  21 18   Height:      Weight:      SpO2: 98%  100% 100%    Intake/Output Summary (Last 24 hours) at 03/20/14 1019 Last data filed at 03/20/14 0700  Gross per 24 hour  Intake 1712.86 ml  Output   1476 ml  Net 236.86 ml   Weight change:  Exam:   General:  Pt is alert, follows commands appropriately, Denies CP currently and SOB. BP  continue to be difficult to control  HEENT: No icterus, No thrush,[place]/AT  Cardiovascular: RRR, S1/S2, no rubs, no gallops  Respiratory: CTA bilaterally, no wheezing, no crackles, no rhonchi  Abdomen: Soft/+BS, non tender, non distended, no guarding  Extremities: No edema, No lymphangitis, No petechiae, No rashes, no synovitis  Data Reviewed: Basic Metabolic Panel:  Recent Labs Lab 03/16/14 0432 03/17/14 0418 03/18/14 0615 03/19/14 0520  NA 130* 125* 126* 132*  K 3.8 3.5* 4.3 4.0  CL 95* 92* 93* 100  CO2 19 20 20 19   GLUCOSE 134* 179* 116* 120*  BUN 12 9 7 6   CREATININE 1.04 0.97 0.96 0.94  CALCIUM 10.0 9.3 9.4 9.3  MG  --   --  1.5  --    Liver Function Tests:  Recent Labs Lab 03/16/14 0432  AST 19  ALT 12  ALKPHOS 74  BILITOT 0.6  PROT 7.6  ALBUMIN 3.6   CBC:  Recent Labs Lab 03/16/14 0432 03/17/14 0418 03/18/14 0615 03/19/14 0520  WBC 6.5 6.1 5.5 5.2  HGB 12.7 10.1* 10.1* 9.7*  HCT 36.8 28.7* 29.0* 27.6*  MCV 89.1 87.0 88.1 88.5  PLT 307 298 268 309   Cardiac Enzymes:  Recent Labs Lab 03/14/14 1337 03/14/14 1808 03/16/14 0805 03/16/14 1405 03/16/14 2100  TROPONINI <0.30 <0.30 <0.30 <0.30 <0.30   BNP: Invalid input(s): POCBNP CBG:  Recent Labs Lab 03/17/14 0032 03/17/14 0453 03/17/14 0743 03/17/14 1325 03/17/14 1612  GLUCAP 164* 157* 169* 121* 120*    Recent Results (from the past 240 hour(s))  Urine culture     Status: None   Collection Time: 03/12/14  7:47 PM  Result Value Ref Range Status   Specimen Description URINE, RANDOM  Final   Special Requests NONE  Final   Culture  Setup Time   Final    03/13/2014 01:31 Performed at [facility] Count   Final    50,000 COLONIES/ML Performed at [facility]    Culture   Final    Multiple bacterial morphotypes present, none predominant. Suggest appropriate recollection if clinically indicated. Performed at [facility]    Report Status  03/13/2014 FINAL  Final  MRSA PCR Screening     Status: None   Collection Time: 03/16/14 12:36 PM  Result Value Ref Range Status   MRSA by PCR NEGATIVE NEGATIVE Final    Comment:        The GeneXpert MRSA Assay (FDA approved for NASAL specimens only), is one component of a comprehensive MRSA colonization surveillance program. It is not intended to diagnose MRSA infection nor to guide or monitor treatment for MRSA infections.      Scheduled Meds: . antiseptic oral rinse  7 mL Mouth Rinse q12n4p  . chlorhexidine  15 mL Mouth Rinse BID  . cloNIDine  0.2 mg Oral BID  . cycloSPORINE  1 drop Both Eyes BID  . hydrALAZINE  10 mg Intravenous 4 times per day  . labetalol  20 mg Intravenous Q6H  . latanoprost  1 drop Right Eye QHS  . levothyroxine  87.5 mcg Intravenous Daily  . metoCLOPramide (REGLAN) injection  10 mg Intravenous 3 times per day  . pantoprazole (PROTONIX) IV  40 mg Intravenous Q12H   Continuous Infusions: . nitroGLYCERIN 50 mcg/min (03/20/14 0645)  . sodium chloride 0.9 % 1,000 mL with potassium chloride 20 mEq infusion 50 mL/hr at 03/19/14 1704     Time: 30 minutes  Vassie Loll, MD [facility] Pager [phone]  If 7PM-7AM, please contact night-coverage [email or web address] Password [email or web address] 03/20/2014, 10:19 AM   LOS: 8 days

## 2014-03-20 NOTE — Progress Notes (Signed)
NUTRITION FOLLOW UP  Intervention:   -Diet advancement per MD; recommend addition of reglan -Recommend Boost Plus BID daily with advancement -Assisted with meal ordering -RD to continue to monitor  Nutrition Dx:   Inadequate oral intake related to inability to eat as evidenced by NPO; ongoing now r/t to clear liquid diet  Goal:   Pt to meet >/= 90% of their estimated nutrition needs   Monitor:   Diet order, total protein/energy intake, labs, weights, GI profile  Assessment:   12/11:  - Pt reports a 7 lb wt loss in the past month. She says that her usual body weight is 150 lbs. Pt follows a pureed diet at home and drinks Boost. Today she has tolerated broth, jello, juice, and ginger ale. Current diet is sips of clears.  - Pt with moderate wasting at the temples.   12/18: -NG placed for decompression; was removed on 12/15 -Diet advanced to clear on 12/15 -Pt tolerating w/out nausea or vomiting. Consuming > 75% of meals Assisted with meal ordering -MD noted plan to advanced to full liquids w/addition of reglan. Pt eager for diet advancement -Will continue to recommend Boost Plus BID w/advancement -Wt +3 lbs since admit  Height: Ht Readings from Last 1 Encounters:  03/19/14 5\' 6"  (1.676 m)    Weight Status:   Wt Readings from Last 1 Encounters:  03/20/14 146 lb 9.7 oz (66.5 kg)    Re-estimated needs:  Kcal: 1625-1950 Protein: 85-95 g Fluid: 2.0 L/day  Skin: WDL  Diet Order: Diet clear liquid   Intake/Output Summary (Last 24 hours) at 03/20/14 1629 Last data filed at 03/20/14 1200  Gross per 24 hour  Intake 1452.44 ml  Output   1775 ml  Net -322.56 ml    Last BM: 12/17   Labs:   Recent Labs Lab 03/17/14 0418 03/18/14 0615 03/19/14 0520  NA 125* 126* 132*  K 3.5* 4.3 4.0  CL 92* 93* 100  CO2 20 20 19   BUN 9 7 6   CREATININE 0.97 0.96 0.94  CALCIUM 9.3 9.4 9.3  MG  --  1.5  --   GLUCOSE 179* 116* 120*    CBG (last 3)  No results for  input(s): GLUCAP in the last 72 hours.  Scheduled Meds: . antiseptic oral rinse  7 mL Mouth Rinse q12n4p  . aspirin  300 mg Rectal Daily  . chlorhexidine  15 mL Mouth Rinse BID  . cloNIDine  0.2 mg Oral TID  . cycloSPORINE  1 drop Both Eyes BID  . hydrALAZINE  10 mg Intravenous 4 times per day  . labetalol  20 mg Intravenous Q6H  . latanoprost  1 drop Right Eye QHS  . levothyroxine  87.5 mcg Intravenous Daily  . metoCLOPramide (REGLAN) injection  10 mg Intravenous 3 times per day  . pantoprazole (PROTONIX) IV  40 mg Intravenous Q12H    Continuous Infusions: . nitroGLYCERIN 50 mcg/min (03/20/14 0645)  . sodium chloride 0.9 % 1,000 mL with potassium chloride 20 mEq infusion 10 mL/hr at 03/20/14 [place] LDN Clinical Dietitian [id number]

## 2014-03-20 NOTE — Progress Notes (Signed)
1 Day Post-Op  Subjective: Tolerated liquids well.  Some fullness in LUQ  Objective: Vital signs in last 24 hours: Temp:  [98 F (36.7 C)-99.3 F (37.4 C)] 98.5 F (36.9 C) (12/18 0400) Pulse Rate:  [63-88] 84 (12/18 0648) Resp:  [0-26] 21 (12/18 0648) BP: (112-245)/(43-102) 162/76 mmHg (12/18 0648) SpO2:  [96 %-100 %] 100 % (12/18 0648) Weight:  [144 lb (65.318 kg)-146 lb 9.7 oz (66.5 kg)] 146 lb 9.7 oz (66.5 kg) (12/18 0400) Last BM Date: 03/19/14  Intake/Output from previous day: 12/17 0701 - 12/18 0700 In: 1712.9 [I.V.:1712.9] Out: 1701 [Urine:1700; Stool:1] Intake/Output this shift:    PE: General- In NAD Abdomen-soft, non tender, midline scar  Lab Results:   Recent Labs  03/18/14 0615 03/19/14 0520  WBC 5.5 5.2  HGB 10.1* 9.7*  HCT 29.0* 27.6*  PLT 268 309   BMET  Recent Labs  03/18/14 0615 03/19/14 0520  NA 126* 132*  K 4.3 4.0  CL 93* 100  CO2 20 19  GLUCOSE 116* 120*  BUN 7 6  CREATININE 0.96 0.94  CALCIUM 9.4 9.3   PT/INR No results for input(s): LABPROT, INR in the last 72 hours. Comprehensive Metabolic Panel:    Component Value Date/Time   NA 132* 03/19/2014 0520   NA 126* 03/18/2014 0615   NA 132* 03/05/2014 1654   NA 127* 02/17/2014 1018   K 4.0 03/19/2014 0520   K 4.3 03/18/2014 0615   CL 100 03/19/2014 0520   CL 93* 03/18/2014 0615   CO2 19 03/19/2014 0520   CO2 20 03/18/2014 0615   BUN 6 03/19/2014 0520   BUN 7 03/18/2014 0615   BUN 22 03/05/2014 1654   BUN 27 02/17/2014 1018   CREATININE 0.94 03/19/2014 0520   CREATININE 0.96 03/18/2014 0615   CREATININE 1.0 09/30/2013   CREATININE 1.0 09/18/2013   GLUCOSE 120* 03/19/2014 0520   GLUCOSE 116* 03/18/2014 0615   GLUCOSE 103* 03/05/2014 1654   GLUCOSE 124* 02/17/2014 1018   CALCIUM 9.3 03/19/2014 0520   CALCIUM 9.4 03/18/2014 0615   AST 19 03/16/2014 0432   AST 21 03/13/2014 0555   ALT 12 03/16/2014 0432   ALT 13 03/13/2014 0555   ALKPHOS 74 03/16/2014 0432   ALKPHOS 60 03/13/2014 0555   BILITOT 0.6 03/16/2014 0432   BILITOT 0.6 03/13/2014 0555   PROT 7.6 03/16/2014 0432   PROT 6.7 03/13/2014 0555   PROT 6.6 02/03/2014 1113   ALBUMIN 3.6 03/16/2014 0432   ALBUMIN 3.4* 03/13/2014 0555     Studies/Results: No results found.  Anti-infectives: Anti-infectives    None      Assessment   Chronic partial gastric outlet obstruction- I suspect this is why she underwent and gastrojejunostomy without antrectomy many years ago.  The gastrojejunostomy was taken down in June of this year and, best I can tell, she is back to her original anatomy which is leading to the tortuosity of the antral and pyloric areas. She may have an element of gastroparesis as well.  She tells me that she and her family are not interested in having any more surgery.     LOS: 8 days   Plan: Check UGI with SBFT once her HTN is under better control.  It is likely that she will need to be on a liquid and slurry type diet with nutrition supplements for the long term.   Tearah Saulsbury J 03/20/2014

## 2014-03-20 NOTE — Progress Notes (Signed)
[facility] Progress Note    Since last GI note: EGD yesterday, see full note in chart.  Tortuous, incompletely narrowed distal stomach.  Surgical evaluation noted, appreciated.  Objective: Vital signs in last 24 hours: Temp:  [98 F (36.7 C)-99.3 F (37.4 C)] 98.5 F (36.9 C) (12/18 0400) Pulse Rate:  [63-88] 84 (12/18 0648) Resp:  [0-26] 21 (12/18 0648) BP: (112-245)/(43-102) 162/76 mmHg (12/18 0648) SpO2:  [96 %-100 %] 100 % (12/18 0648) Weight:  [144 lb (65.318 kg)-146 lb 9.7 oz (66.5 kg)] 146 lb 9.7 oz (66.5 kg) (12/18 0400) Last BM Date: 03/19/14 General: alert and oriented times 3 Heart: regular rate and rythm Abdomen: soft, non-tender, non-distended, normal bowel sounds   Lab Results:  Recent Labs  03/18/14 0615 03/19/14 0520  WBC 5.5 5.2  HGB 10.1* 9.7*  PLT 268 309  MCV 88.1 88.5    Recent Labs  03/18/14 0615 03/19/14 0520  NA 126* 132*  K 4.3 4.0  CL 93* 100  CO2 20 19  GLUCOSE 116* 120*  BUN 7 6  CREATININE 0.96 0.94  CALCIUM 9.4 9.3   Medications: Scheduled Meds: . antiseptic oral rinse  7 mL Mouth Rinse q12n4p  . chlorhexidine  15 mL Mouth Rinse BID  . cycloSPORINE  1 drop Both Eyes BID  . hydrALAZINE  10 mg Intravenous 4 times per day  . labetalol  20 mg Intravenous Q6H  . latanoprost  1 drop Right Eye QHS  . levothyroxine  87.5 mcg Intravenous Daily  . metoCLOPramide (REGLAN) injection  10 mg Intravenous 3 times per day  . pantoprazole (PROTONIX) IV  40 mg Intravenous Q12H   Continuous Infusions: . nitroGLYCERIN 50 mcg/min (03/20/14 0645)  . sodium chloride 0.9 % 1,000 mL with potassium chloride 20 mEq infusion 50 mL/hr at 03/19/14 1704   PRN Meds:.Place/Maintain arterial line **AND** sodium chloride, acetaminophen **OR** acetaminophen, hydrALAZINE, morphine injection, nitroGLYCERIN, ondansetron **OR** ondansetron (ZOFRAN) IV, polyvinyl alcohol    Assessment/Plan: 80 y.o. female incomplete, partial gastric outlet  obstruction; malignant hypertension  She has incomplete gastric outlet obstruction from distally tortuous, anatomically abnormal distal stomach. This was the same problem she had many years ago that led to her original gastro-jejunostomy.  Recent bezoar, currently difficultly with gastric secretions however UGI showed contrast does trickle through into duodenum. Difficult situation.  Pt and family do not want any further surgeries.  Agree with repeat UGI, SBFT to define anatomy best.  Malignant hypertension limits testing, treatment obviously.  She was started on reglan yesterday, perhaps this will relieve some of the problem.  Would not plan to advance her diet past full liquids.  If she proves she cannot tolerate full liquids with reglan, then would consider venting PEG, perhaps PEG-J to allow nutritional support.   Milus Banister, MD  03/20/2014, 8:07 AM [facility] Pager [phone]

## 2014-03-21 LAB — CBC
HCT: 25 % — ABNORMAL LOW (ref 36.0–46.0)
Hemoglobin: 8.8 g/dL — ABNORMAL LOW (ref 12.0–15.0)
MCH: 31.1 pg (ref 26.0–34.0)
MCHC: 35.2 g/dL (ref 30.0–36.0)
MCV: 88.3 fL (ref 78.0–100.0)
Platelets: 303 10*3/uL (ref 150–400)
RBC: 2.83 MIL/uL — ABNORMAL LOW (ref 3.87–5.11)
RDW: 15.9 % — ABNORMAL HIGH (ref 11.5–15.5)
WBC: 4.4 10*3/uL (ref 4.0–10.5)

## 2014-03-21 MED ORDER — VITAMINS A & D EX OINT
TOPICAL_OINTMENT | CUTANEOUS | Status: AC
  Administered 2014-03-21: 09:00:00
  Filled 2014-03-21: qty 5

## 2014-03-21 MED ORDER — AMLODIPINE 1 MG/ML ORAL SUSPENSION
10.0000 mg | Freq: Every day | ORAL | Status: DC
  Administered 2014-03-21 – 2014-03-26 (×5): 10 mg via ORAL
  Filled 2014-03-21 (×8): qty 10

## 2014-03-21 MED ORDER — LABETALOL HCL 5 MG/ML IV SOLN
20.0000 mg | Freq: Four times a day (QID) | INTRAVENOUS | Status: DC
  Administered 2014-03-21 – 2014-03-26 (×18): 20 mg via INTRAVENOUS
  Filled 2014-03-21 (×22): qty 4

## 2014-03-21 NOTE — Progress Notes (Signed)
PROGRESS NOTE  Shannon [name] GEX:[id number] DOB: 05-18-1923 DOA: 03/12/2014 PCP: Oneal Grout, MD   Brief history 80 y.o. female with remote gastro-jejunostomy for peptic ulcer disease. She unfortunately had a perforation of an anastomotic ulcer at the gastro-jejunal anastomosis in June 2015 and underwent surgery at [facility].Patient was hospitalized here in October 2015 with abdominal pain nausea and vomiting and had endoscopy done twice during that admission with finding of a gastric bezoar.She was discharged home on a pured diet and PPI therapy.She unfortunately present on 12/10 with abd pain/vomiting, CT scan of abdomen showed persistent marked thickening of the pyloric channel and prepyloric antrum and a large filling defect in the stomach measuring 12 x 10 x 15 cm consistent with a bezoar.She was admitted for further evaluation and treatment. GI and Gen Surgery have been consulted; will follow rec's. Course complicated with hypertensive urgency and patient is requiring NTG drip. BP even better, still elevated.  Assessment/Plan: Gastric Outlet Obstruction: -Initially some question of perforation at GE junction on CT Scan, but this was ruled out on Barium Esophagogram.  -continues to feel bloated/belching-having chest pain-suspect from upward pressure from outlet obstruction- troponin neg X5 -NGT for decompression--removed 03/17/14-- no nausea or vomiting. (But reports some discomfort in her epigastric/LUQ area) -03/19/14--EGD demonstrated incomplete gastric outlet obstruction from distal tortuous/anatomically abnormal distal stomach from surgery (gastrojejunostomy).  -surgery consulted and will follow rec's (Family do not want any more surgeries) -plans for SBFT noted   HTN Urgency  -Patient was transferred to SDU on 03/16/14 and started on NTG drip -BP still elevated and requiring NTG drip -continue schedule hydralazine, continue labetalol 20mg  Q6H and will also add  clonidine (solution TID; as patient is able to tolerate clear liquid) -add amlodipine suspension today -will titrate off NTG drip as tolerated--titratefor SBP <170 and DBP<100 -appreciated cardiology rec's -BP will be easier to control once able to take PO meds.  Remote hx of Gastrojejunostomy with perforation of anastomotic ulcer in June 2015-s/p revision/laparotomy -general surgery consulted--plans noted for SBFT -continue liquid diet for now and use of reglan  -Patient is passing flatus and having small bowel movements  Atypical Chest pain  -Troponins negative 3  -EKG with nonspecific changes -most likely secondary to GERD and hypertensive urgency -continue PPI and BP control   Diabetes mellitus type 2 -12/24/2013 hemoglobin A1c 6.9 -Given the patient's age and current recommendations, will allow for  Liberal gylcemic -will monitor for now -low carb diet when able to tolerate PO's  hyponatremia  -Secondary to hypotonic fluid given on admission  -improved/resolved with IVF's -will change IVF's to [facility] now  CKD stage 3 -baseline creatinine 0.9-1.3 -appears to remained stable/at baseline currently -will monitor  hypokalemia -repletion/maintenance on IVF  -MG level WNL  Hypothyroidism: -continue with IV Levothyroxine for now.   Hx CVA ASA Suppositories until able to fully take po No new deficits appreciated   Depression:  -since NPO-all meds on hold-resume when able to fully tolerate by mouth meds    Family Communication: Pt at beside Disposition Plan: Home when medically stable   Procedures/Studies: Ct Abdomen Pelvis Wo Contrast  03/12/2014   CLINICAL DATA:  Constant vomiting today, constipated since Saturday, RIGHT lower quadrant rectal pain, history diabetes, hypertension, prior gastric bezoar  EXAM: CT ABDOMEN AND PELVIS WITHOUT CONTRAST  TECHNIQUE: Multidetector CT imaging of the abdomen and pelvis was performed following the standard protocol  without IV contrast. Sagittal and coronal MPR images  reconstructed from axial data set.  COMPARISON:  03/07/2014  FINDINGS: Minimal bibasilar atelectasis.  Low-attenuation foci within liver, question cysts, unchanged.  Metallic foreign body at LEFT ventricle again identified, uncertain etiology.  Gallbladder surgically absent.  Probable tiny LEFT renal cyst 9 mm diameter.  Within limits of a nonenhanced exam, remainder of liver, spleen, pancreas, kidneys and adrenal glands normal appearance.  Persistent marked thickening of the pyloric channel and pre pyloric antrum gastric distention question partial gastric outlet obstruction.  Small amount of ingested contrast is present within proximal small bowel loops.  Large filling defect within the stomach, 12.2 x 10.0 x 15.2 cm, containing bubbly gas, consistent with recurrent bezoar.  Scattered colonic diverticula.  No evidence of bowel obstruction or dilatation.  Extensive atherosclerotic calcifications aorta and minimally in coronary arteries.  High attenuation nodule 13 mm diameter again identified adjacent to the thickened gastroesophageal junction, unchanged but since 03/07/2014 but new since 01/17/2014, question contrast within epiphrenic diverticulum or small focal contained perforation, unlikely calcified lymph node since this is new in past 2 months.  Unremarkable bladder, ureters, uterus and adnexae.  Appendix not visualized.  No definite rectal wall thickening.  No additional mass, adenopathy, free fluid, free air or abscess.  Bones demineralized with scattered degenerative changes.  Surgical scar at ventral abdomen with a small incisional hernia containing a portion of transverse colon.  IMPRESSION: Thickened pylorus and pre-pyloric antrum likely causing partial outlet obstruction.  Large filling defect within stomach consistent with recurrent bezoar 12.2 x 10.0 x 15.2 cm.  Colonic diverticulosis.  Suspected hepatic cysts unchanged.  13 mm high attenuation  nodule adjacent to thickened gastroesophageal junction, question contained perforation versus epiphrenic diverticulum containing contrast, new since 01/17/2014 but unchanged since 03/07/2014.  Small incisional hernia containing a portion of the transverse colon wall without obstruction.   Electronically Signed   By: Ulyses Southward M.D.   On: 03/12/2014 23:12   Ct Abdomen Pelvis W Contrast  03/07/2014   CLINICAL DATA:  80 year old female presenting with rectal pressure gradually worsening over the prior month.  EXAM: CT ABDOMEN AND PELVIS WITH CONTRAST  TECHNIQUE: Multidetector CT imaging of the abdomen and pelvis was performed using the standard protocol following bolus administration of intravenous contrast.  CONTRAST:  50mL OMNIPAQUE IOHEXOL 300 MG/ML SOLN, 80mL OMNIPAQUE IOHEXOL 300 MG/ML SOLN  COMPARISON:  CT of the abdomen and pelvis 01/17/2014.  FINDINGS: Lower chest: Cardiomegaly with biatrial dilatation. Calcifications of the aortic valve. Atherosclerotic calcifications in the left main and left circumflex coronary arteries. Small hiatal hernia.  Hepatobiliary: Multifocal low-attenuation lesions appear unchanged in size, number and distribution scattered throughout the liver. Many these are too small to definitively characterize, while the larger lesions are intermediate attenuation, favored to represent proteinaceous cysts. The largest of these lesions measures only 12 mm in segment 2 (image 21 of series 2). No new hepatic lesions are noted. No intra or extrahepatic biliary ductal dilatation. Status post cholecystectomy.  Pancreas: Unremarkable.  Spleen: Unremarkable.  Adrenals/Urinary Tract: Again noted is a 1.2 cm intermediate attenuation (23 HU) lesion in the lower pole of the left kidney, with some surrounding architectural distortion, favored to represent a previously ruptured proteinaceous cyst. No suspicious renal lesions are noted. Multifocal cortical thinning in the kidneys bilaterally, presumably  mild scarring. No hydroureteronephrosis. Urinary bladder is normal in appearance. Bilateral adrenal glands are normal in appearance.  Stomach/Bowel: As with the prior examination there is extensive thickening of the gastric wall in the antral pre-pyloric region of the  stomach. The pylorus is also markedly thickened (up to 15 mm in thickness). The stomach is moderately distended. No pathologic dilatation of small bowel or colon. Postoperative changes of partial colectomy near the rectosigmoid junction are noted. A few scattered colonic diverticulae are noted, without surrounding inflammatory changes to suggest an acute diverticulitis at this time. The distal rectum and anus are grossly unremarkable in appearance.  Vascular/Lymphatic: Extensive atherosclerosis throughout the abdominal and pelvic vasculature, without evidence of aneurysm or dissection. No lymphadenopathy noted in the abdomen or pelvis.  Reproductive: Retroverted uterus.  Ovaries are atrophic.  Other: No significant volume of ascites.  No pneumoperitoneum.  Musculoskeletal: Tiny ventral hernia, through which there is incomplete herniation of the mid transverse colon; no findings to suggest associated bowel incarceration or obstruction at this time. Old compression fracture of superior endplate of T11 with approximately 20% loss of anterior vertebral body height (unchanged). There are no aggressive appearing lytic or blastic lesions noted in the visualized portions of the skeleton.  IMPRESSION: 1. No findings to account for the patient's worsening rectal pressure. 2. There is persistent thickening of the pylorus and antral pre-pyloric region of the stomach, which could be inflammatory in the setting of ulcer disease, or could be neoplastic. There is persistent moderate dilatation of the stomach, suggesting some degree of gastric outlet obstruction. Correlation with results of recent upper endoscopy is suggested. 3. Additional incidental findings, as  above, similar to prior examination.   Electronically Signed   By: Trudie Reed M.D.   On: 03/07/2014 16:28   Dg Chest Port 1 View  03/14/2014   CLINICAL DATA:  80 year old female with new onset chest pain beginning today  EXAM: PORTABLE CHEST - 1 VIEW  COMPARISON:  None.  FINDINGS: Cardiomegaly. Atherosclerotic calcification present in the transverse aorta. No focal airspace consolidation, pulmonary edema, pneumothorax. a query trace left pleural effusion. No acute osseous abnormality. The left glenohumeral joint osteoarthritis. Mild coarsening of the interstitial markings.  IMPRESSION: 1. Query trace left pleural effusion. 2. Cardiomegaly. 3. Aortic atherosclerosis.   Electronically Signed   By: Malachy Moan M.D.   On: 03/14/2014 10:12   Dg Abd Portable 1v  03/15/2014   CLINICAL DATA:  Nasogastric tube placement.  EXAM: PORTABLE ABDOMEN - 1 VIEW  COMPARISON:  03/14/2014  FINDINGS: The nasogastric tube is in place, tip coiled within the stomach and tip overlying the region of the fundus. Surgical clips are noted in the right upper quadrant. There is residual contrast in the colon and distal small bowel following esophagram on 03/13/2014. Bowel gas pattern is nonobstructive.  IMPRESSION: Nasogastric tube coiled within the stomach.   Electronically Signed   By: Rosalie Gums M.D.   On: 03/15/2014 10:49   Dg Abd Portable 2v  03/14/2014   CLINICAL DATA:  80 year old female with persistent abdominal pain and fullness.  EXAM: PORTABLE ABDOMEN - 2 VIEW  COMPARISON:  Esophagram performed yesterday. CT abdomen/ pelvis 03/12/2014  FINDINGS: No evidence of free air on the lateral decubitus view. Contrast material is noted within the stomach and scattered throughout the large bowel. There is no evidence of obstruction. The volume of contrast material remaining within the stomach is greater than expected. The bones are diffusely osteopenic. Surgical clips in the right upper quadrant suggest prior  cholecystectomy.  IMPRESSION: 1. No evidence of obstruction or free air. 2. Greater than expected volume of retained oral contrast within the stomach. Query clinical history of delayed gastric emptying or gastroparesis? 3. The bones appear osteopenic.  Electronically Signed   By: Malachy Moan M.D.   On: 03/14/2014 10:04   Dg Esophagus W/water Sol Cm  03/13/2014   CLINICAL DATA:  Abnormal CT, evaluate for esophageal perforation  EXAM: ESOPHOGRAM/BARIUM SWALLOW  TECHNIQUE: Single contrast examination was performed using water-soluble contrast.  FLUOROSCOPY TIME:  1 min 7 seconds  COMPARISON:  CT abdomen pelvis dated 03/12/2014 and 03/07/2014  FINDINGS: Esophageal dysmotility.  No fixed esophageal narrowing/stricture.  Suspected small paraesophageal hernia (less likely hiatal hernia or esophageal diverticulum) at the GE junction. The appearance does not suggest a contained esophageal perforation.  Presence of gastroesophageal reflux could not be assessed due to the patient's inability to clear her esophagus.  Moderate food/debris in the stomach.  IMPRESSION: Esophageal dysmotility.  No fixed esophageal narrowing/stricture.  Suspected small paraesophageal hernia at the GE junction. No evidence of contained esophageal perforation.  Moderate food/debris in the stomach.   Electronically Signed   By: Charline Bills M.D.   On: 03/13/2014 09:44         Subjective:  patient denies fevers, chills, chest pain, shortness breath, nausea, vomiting, diarrhea, dysuria, hematuria. She still has some mild left upper quadrant discomfort.   Objective: Filed Vitals:   03/21/14 0400 03/21/14 0500 03/21/14 0600 03/21/14 0640  BP: 180/91  186/69   Pulse: 63  65 66  Temp: 97.9 F (36.6 C)     TempSrc:      Resp: 20  17 15   Height:      Weight:  66 kg (145 lb 8.1 oz)    SpO2: 96%  97% 99%    Intake/Output Summary (Last 24 hours) at 03/21/14 0754 Last data filed at 03/21/14 0600  Gross per 24 hour    Intake   1635 ml  Output   2150 ml  Net   -515 ml   Weight change: 0.682 kg (1 lb 8.1 oz) Exam:   General:  Pt is alert, follows commands appropriately, not in acute distress  HEENT: No icterus, No thrush,  [place]/AT  Cardiovascular: RRR, S1/S2, no rubs, no gallops  Respiratory: CTA bilaterally, no wheezing, no crackles, no rhonchi  Abdomen: Soft/+BS, epigastric/LUQ pain without rebound, non distended, no guarding  Extremities: No edema, No lymphangitis, No petechiae, No rashes, no synovitis  Data Reviewed: Basic Metabolic Panel:  Recent Labs Lab 03/16/14 0432 03/17/14 0418 03/18/14 0615 03/19/14 0520  NA 130* 125* 126* 132*  K 3.8 3.5* 4.3 4.0  CL 95* 92* 93* 100  CO2 19 20 20 19   GLUCOSE 134* 179* 116* 120*  BUN 12 9 7 6   CREATININE 1.04 0.97 0.96 0.94  CALCIUM 10.0 9.3 9.4 9.3  MG  --   --  1.5  --    Liver Function Tests:  Recent Labs Lab 03/16/14 0432  AST 19  ALT 12  ALKPHOS 74  BILITOT 0.6  PROT 7.6  ALBUMIN 3.6   No results for input(s): LIPASE, AMYLASE in the last 168 hours. No results for input(s): AMMONIA in the last 168 hours. CBC:  Recent Labs Lab 03/16/14 0432 03/17/14 0418 03/18/14 0615 03/19/14 0520 03/21/14 0528  WBC 6.5 6.1 5.5 5.2 4.4  HGB 12.7 10.1* 10.1* 9.7* 8.8*  HCT 36.8 28.7* 29.0* 27.6* 25.0*  MCV 89.1 87.0 88.1 88.5 88.3  PLT 307 298 268 309 303   Cardiac Enzymes:  Recent Labs Lab 03/14/14 1337 03/14/14 1808 03/16/14 0805 03/16/14 1405 03/16/14 2100  TROPONINI <0.30 <0.30 <0.30 <0.30 <0.30   BNP: Invalid input(s): POCBNP  CBG:  Recent Labs Lab 03/17/14 0032 03/17/14 0453 03/17/14 0743 03/17/14 1325 03/17/14 1612  GLUCAP 164* 157* 169* 121* 120*    Recent Results (from the past 240 hour(s))  Urine culture     Status: None   Collection Time: 03/12/14  7:47 PM  Result Value Ref Range Status   Specimen Description URINE, RANDOM  Final   Special Requests NONE  Final   Culture  Setup Time   Final     03/13/2014 01:31 Performed at [facility] Count   Final    50,000 COLONIES/ML Performed at [facility]    Culture   Final    Multiple bacterial morphotypes present, none predominant. Suggest appropriate recollection if clinically indicated. Performed at [facility]    Report Status 03/13/2014 FINAL  Final  MRSA PCR Screening     Status: None   Collection Time: 03/16/14 12:36 PM  Result Value Ref Range Status   MRSA by PCR NEGATIVE NEGATIVE Final    Comment:        The GeneXpert MRSA Assay (FDA approved for NASAL specimens only), is one component of a comprehensive MRSA colonization surveillance program. It is not intended to diagnose MRSA infection nor to guide or monitor treatment for MRSA infections.      Scheduled Meds: . amLODipine  10 mg Oral Daily  . antiseptic oral rinse  7 mL Mouth Rinse q12n4p  . aspirin  300 mg Rectal Daily  . chlorhexidine  15 mL Mouth Rinse BID  . cloNIDine  0.2 mg Oral TID  . cycloSPORINE  1 drop Both Eyes BID  . hydrALAZINE  10 mg Intravenous 4 times per day  . labetalol  20 mg Intravenous Q6H  . latanoprost  1 drop Right Eye QHS  . levothyroxine  87.5 mcg Intravenous Daily  . metoCLOPramide (REGLAN) injection  10 mg Intravenous 3 times per day  . pantoprazole (PROTONIX) IV  40 mg Intravenous Q12H   Continuous Infusions: . nitroGLYCERIN 40 mcg/min (03/21/14 0730)  . sodium chloride 0.9 % 1,000 mL with potassium chloride 20 mEq infusion 10 mL/hr at 03/20/14 2207     Louise Rawson, DO  [facility] Pager [phone]  If 7PM-7AM, please contact night-coverage [email or web address] Password [email or web address] 03/21/2014, 7:54 AM   LOS: 9 days

## 2014-03-21 NOTE — Progress Notes (Signed)
2 Days Post-Op  Subjective: Cold this morning.  Objective: Vital signs in last 24 hours: Temp:  [97.2 F (36.2 C)-98.1 F (36.7 C)] 97.9 F (36.6 C) (12/19 0400) Pulse Rate:  [63-96] 66 (12/19 0640) Resp:  [14-30] 15 (12/19 0640) BP: (168-228)/(58-103) 186/69 mmHg (12/19 0600) SpO2:  [96 %-100 %] 99 % (12/19 0640) Weight:  [145 lb 8.1 oz (66 kg)] 145 lb 8.1 oz (66 kg) (12/19 0500) Last BM Date: 03/19/14  Intake/Output from previous day: 12/18 0701 - 12/19 0700 In: 1635 [P.O.:920; I.V.:715] Out: 2150 [Urine:2150] Intake/Output this shift:    PE: General- In NAD Abdomen-soft, not tender  Lab Results:   Recent Labs  03/19/14 0520 03/21/14 0528  WBC 5.2 4.4  HGB 9.7* 8.8*  HCT 27.6* 25.0*  PLT 309 303   BMET  Recent Labs  03/19/14 0520  NA 132*  K 4.0  CL 100  CO2 19  GLUCOSE 120*  BUN 6  CREATININE 0.94  CALCIUM 9.3   PT/INR No results for input(s): LABPROT, INR in the last 72 hours. Comprehensive Metabolic Panel:    Component Value Date/Time   NA 132* 03/19/2014 0520   NA 126* 03/18/2014 0615   NA 132* 03/05/2014 1654   NA 127* 02/17/2014 1018   K 4.0 03/19/2014 0520   K 4.3 03/18/2014 0615   CL 100 03/19/2014 0520   CL 93* 03/18/2014 0615   CO2 19 03/19/2014 0520   CO2 20 03/18/2014 0615   BUN 6 03/19/2014 0520   BUN 7 03/18/2014 0615   BUN 22 03/05/2014 1654   BUN 27 02/17/2014 1018   CREATININE 0.94 03/19/2014 0520   CREATININE 0.96 03/18/2014 0615   CREATININE 1.0 09/30/2013   CREATININE 1.0 09/18/2013   GLUCOSE 120* 03/19/2014 0520   GLUCOSE 116* 03/18/2014 0615   GLUCOSE 103* 03/05/2014 1654   GLUCOSE 124* 02/17/2014 1018   CALCIUM 9.3 03/19/2014 0520   CALCIUM 9.4 03/18/2014 0615   AST 19 03/16/2014 0432   AST 21 03/13/2014 0555   ALT 12 03/16/2014 0432   ALT 13 03/13/2014 0555   ALKPHOS 74 03/16/2014 0432   ALKPHOS 60 03/13/2014 0555   BILITOT 0.6 03/16/2014 0432   BILITOT 0.6 03/13/2014 0555   PROT 7.6 03/16/2014 0432   PROT 6.7 03/13/2014 0555   PROT 6.6 02/03/2014 1113   ALBUMIN 3.6 03/16/2014 0432   ALBUMIN 3.4* 03/13/2014 0555     Studies/Results: No results found.  Anti-infectives: Anti-infectives    None      Assessment Partial GOO-UGI not able to be done because of malignant HTN; family does not want her to have any other operations.   LOS: 9 days   Plan: Full liquids.  UGI when HTN controlled.  Will see her after UGI done.   Shyan Scalisi J 03/21/2014

## 2014-03-22 ENCOUNTER — Inpatient Hospital Stay (HOSPITAL_COMMUNITY): Payer: Medicare Other

## 2014-03-22 DIAGNOSIS — G9340 Encephalopathy, unspecified: Secondary | ICD-10-CM

## 2014-03-22 DIAGNOSIS — R4781 Slurred speech: Secondary | ICD-10-CM | POA: Insufficient documentation

## 2014-03-22 LAB — BASIC METABOLIC PANEL
Anion gap: 14 (ref 5–15)
BUN: 4 mg/dL — ABNORMAL LOW (ref 6–23)
CO2: 21 mEq/L (ref 19–32)
Calcium: 9.7 mg/dL (ref 8.4–10.5)
Chloride: 92 mEq/L — ABNORMAL LOW (ref 96–112)
Creatinine, Ser: 0.95 mg/dL (ref 0.50–1.10)
GFR calc Af Amer: 60 mL/min — ABNORMAL LOW (ref >90)
GFR calc non Af Amer: 52 mL/min — ABNORMAL LOW (ref >90)
Glucose, Bld: 128 mg/dL — ABNORMAL HIGH (ref 70–99)
Potassium: 4.4 mEq/L (ref 3.7–5.3)
Sodium: 127 mEq/L — ABNORMAL LOW (ref 137–147)

## 2014-03-22 LAB — VITAMIN B12: Vitamin B-12: >2000 pg/mL — ABNORMAL HIGH (ref 211–911)

## 2014-03-22 LAB — URINALYSIS, ROUTINE W REFLEX MICROSCOPIC
Bilirubin Urine: NEGATIVE
Glucose, UA: NEGATIVE mg/dL
Hgb urine dipstick: NEGATIVE
Ketones, ur: NEGATIVE mg/dL
Leukocytes, UA: NEGATIVE
Nitrite: NEGATIVE
Protein, ur: NEGATIVE mg/dL
Specific Gravity, Urine: 1.009 (ref 1.005–1.030)
Urobilinogen, UA: 0.2 mg/dL (ref 0.0–1.0)
pH: 7 (ref 5.0–8.0)

## 2014-03-22 LAB — CBC
HCT: 28.9 % — ABNORMAL LOW (ref 36.0–46.0)
Hemoglobin: 9.9 g/dL — ABNORMAL LOW (ref 12.0–15.0)
MCH: 30 pg (ref 26.0–34.0)
MCHC: 34.3 g/dL (ref 30.0–36.0)
MCV: 87.6 fL (ref 78.0–100.0)
Platelets: 341 10*3/uL (ref 150–400)
RBC: 3.3 MIL/uL — ABNORMAL LOW (ref 3.87–5.11)
RDW: 15.8 % — ABNORMAL HIGH (ref 11.5–15.5)
WBC: 4.7 10*3/uL (ref 4.0–10.5)

## 2014-03-22 LAB — OSMOLALITY, URINE: Osmolality, Ur: 409 mOsm/kg (ref 390–1090)

## 2014-03-22 LAB — TSH: TSH: 27.66 u[IU]/mL — ABNORMAL HIGH (ref 0.350–4.500)

## 2014-03-22 LAB — FOLATE RBC: RBC Folate: >1000 ng/mL — ABNORMAL HIGH (ref >280)

## 2014-03-22 LAB — CREATININE, URINE, RANDOM: Creatinine, Urine: 43.2 mg/dL

## 2014-03-22 LAB — SODIUM, URINE, RANDOM: Sodium, Ur: 167 mEq/L

## 2014-03-22 LAB — OSMOLALITY: Osmolality: 261 mOsm/kg — ABNORMAL LOW (ref 275–300)

## 2014-03-22 MED ORDER — CLONIDINE HCL 0.3 MG/24HR TD PTWK
0.3000 mg | MEDICATED_PATCH | TRANSDERMAL | Status: DC
  Administered 2014-03-22: 0.3 mg via TRANSDERMAL
  Filled 2014-03-22 (×2): qty 1

## 2014-03-22 MED ORDER — HYDRALAZINE HCL 20 MG/ML IJ SOLN
10.0000 mg | INTRAMUSCULAR | Status: DC | PRN
  Administered 2014-03-23 – 2014-03-24 (×4): 10 mg via INTRAVENOUS
  Filled 2014-03-22 (×5): qty 1

## 2014-03-22 MED ORDER — SODIUM CHLORIDE 0.9 % IV SOLN
INTRAVENOUS | Status: DC
  Administered 2014-03-22 – 2014-03-25 (×6): via INTRAVENOUS
  Filled 2014-03-22 (×12): qty 1000

## 2014-03-22 MED ORDER — NITROGLYCERIN 0.2 MG/HR TD PT24
0.2000 mg | MEDICATED_PATCH | Freq: Every day | TRANSDERMAL | Status: DC
  Administered 2014-03-22 – 2014-03-24 (×3): 0.2 mg via TRANSDERMAL
  Filled 2014-03-22 (×5): qty 1

## 2014-03-22 NOTE — Progress Notes (Addendum)
Called by RN to update family at 65. Came to bedside to update family at [phone] Daughter, grandson, sister present for discussion Family updated on pt condition and plan  Pt more alert than earlier today--now awake and able to follow multiple step commands and recognizes and able to identify family members present   UA unremarkable, MRI brain previously ordered Neurology was consulted prior to family meeting Total time for meeting 62min.  DTat

## 2014-03-22 NOTE — Progress Notes (Signed)
UGI still not done.  Will check back after it has been done.

## 2014-03-22 NOTE — Progress Notes (Signed)
Around 0145 this morning, patients blood pressure significantly increased from SBP 130s to SBP in the 200s. Pt had been sitting below systolic of [id number] for the past 2.5 hours. RN went in to check on patient. Pt was harder to arouse than earlier in the night. The patients speech also was more slurred than earlier in the night. Pupils check and were equal and reactive. Facial symmetry was symmetrical and grips were equal bilaterally. PRN hydralazine given and nitro drip increased. Kathline Magic called to update. Order given for head CT. Current BP is 140/53 through Arterial line. Will continue to monitor.

## 2014-03-22 NOTE — Progress Notes (Signed)
PROGRESS NOTE  Shannon [name] GUY:[id number] DOB: 11-Oct-1923 DOA: 03/12/2014 PCP: Oneal Grout, MD  Brief history 80 y.o. female with remote gastro-jejunostomy for peptic ulcer disease. She unfortunately had a perforation of an anastomotic ulcer at the gastro-jejunal anastomosis in June 2015 and underwent surgery at [facility].Patient was hospitalized here in October 2015 with abdominal pain nausea and vomiting and had endoscopy done twice during that admission with finding of a gastric bezoar.She was discharged home on a pured diet and PPI therapy.She unfortunately present on 12/10 with abd pain/vomiting, CT scan of abdomen showed persistent marked thickening of the pyloric channel and prepyloric antrum and a large filling defect in the stomach measuring 12 x 10 x 15 cm consistent with a bezoar.She was admitted for further evaluation and treatment. GI and Gen Surgery have been consulted; will follow rec's. Course complicated with hypertensive urgency and patient is requiring NTG drip. BP even better, still elevated.  Assessment/Plan: Gastric Outlet Obstruction: -Initially some question of perforation at GE junction on CT Scan, but this was ruled out on Barium Esophagogram.  -continues to feel bloated/belching-having chest pain-suspect from upward pressure from outlet obstruction- troponin neg X5 -NGT for decompression--removed 03/17/14-- no nausea or vomiting. (But reports some discomfort in her epigastric/LUQ area) -03/19/14--EGD demonstrated incomplete gastric outlet obstruction from distal tortuous/anatomically abnormal distal stomach from surgery (gastrojejunostomy).  -surgery consulted and will follow rec's (Family do not want any more surgeries) -plans for SBFT noted   HTN Urgency  -Patient was transferred to SDU on 03/16/14 and started on NTG drip -BP still elevated and requiring NTG drip -continue schedule hydralazine, continue labetalol 20mg  Q6H and will also add  clonidine (solution TID; as patient is able to tolerate clear liquid) -03/21/14 add amlodipine suspension  -03/22/14 start clonidine TTS-3 patch-->wean oral clonidine over next 2-3 days as it will take 48-72hrs for patch to take full effect -03/22/14--start nitro patch -will titrate off NTG drip as tolerated--titratefor SBP <170 and DBP<100 -BP will be easier to control once able to take PO meds. -check renin/aldosterone ratio  Acute encephalopathy -03/22/2014 patient is more somnolent -Likely multifactorial which may include hyponatremia, hypertensive encephalopathy, PRES, UTI, or new cerebral ischemia -urine Na, urine creatinine, urine osmolarity, serum osmolarity -EKG -MRI brain without contrast -check TSH, serum B12, RBC folate -UA and urine Culture -continue to titrate nitro drip for SBP<170 -start NS at 75cc/hr -d/c all opioids and hypnotics  Remote hx of Gastrojejunostomy with perforation of anastomotic ulcer in June 2015-s/p revision/laparotomy -general surgery consulted--plans noted for SBFT -continue liquid diet for now and use of reglan  -Patient is passing flatus and having small bowel movements  Atypical Chest pain  -Troponins negative 3  -EKG with nonspecific changes -most likely secondary to GERD and hypertensive urgency -continue PPI and BP control   Diabetes mellitus type 2 -12/24/2013 hemoglobin A1c 6.9 -Given the patient's age and current recommendations, will allow for Liberal gylcemic -will monitor for now -low carb diet when able to tolerate PO's  hyponatremia  -Secondary to hypotonic fluid given on admission previously -check TSH, urine and serum osm -restart NS  CKD stage 3 -baseline creatinine 0.9-1.3 -appears to remained stable/at baseline currently -will monitor  hypokalemia -repletion/maintenance on IVF  -MG level WNL  Hypothyroidism: -continue with IV Levothyroxine for now.   Hx CVA ASA Suppositories until able to fully take  po No new deficits appreciated   Depression:  -since NPO-all meds on hold-resume when able  to fully tolerate by mouth meds    Family Communication: Pt at beside Disposition Plan: Home when medically stable    Procedures/Studies: Ct Abdomen Pelvis Wo Contrast  03/12/2014   CLINICAL DATA:  Constant vomiting today, constipated since Saturday, RIGHT lower quadrant rectal pain, history diabetes, hypertension, prior gastric bezoar  EXAM: CT ABDOMEN AND PELVIS WITHOUT CONTRAST  TECHNIQUE: Multidetector CT imaging of the abdomen and pelvis was performed following the standard protocol without IV contrast. Sagittal and coronal MPR images reconstructed from axial data set.  COMPARISON:  03/07/2014  FINDINGS: Minimal bibasilar atelectasis.  Low-attenuation foci within liver, question cysts, unchanged.  Metallic foreign body at LEFT ventricle again identified, uncertain etiology.  Gallbladder surgically absent.  Probable tiny LEFT renal cyst 9 mm diameter.  Within limits of a nonenhanced exam, remainder of liver, spleen, pancreas, kidneys and adrenal glands normal appearance.  Persistent marked thickening of the pyloric channel and pre pyloric antrum gastric distention question partial gastric outlet obstruction.  Small amount of ingested contrast is present within proximal small bowel loops.  Large filling defect within the stomach, 12.2 x 10.0 x 15.2 cm, containing bubbly gas, consistent with recurrent bezoar.  Scattered colonic diverticula.  No evidence of bowel obstruction or dilatation.  Extensive atherosclerotic calcifications aorta and minimally in coronary arteries.  High attenuation nodule 13 mm diameter again identified adjacent to the thickened gastroesophageal junction, unchanged but since 03/07/2014 but new since 01/17/2014, question contrast within epiphrenic diverticulum or small focal contained perforation, unlikely calcified lymph node since this is new in past 2 months.  Unremarkable  bladder, ureters, uterus and adnexae.  Appendix not visualized.  No definite rectal wall thickening.  No additional mass, adenopathy, free fluid, free air or abscess.  Bones demineralized with scattered degenerative changes.  Surgical scar at ventral abdomen with a small incisional hernia containing a portion of transverse colon.  IMPRESSION: Thickened pylorus and pre-pyloric antrum likely causing partial outlet obstruction.  Large filling defect within stomach consistent with recurrent bezoar 12.2 x 10.0 x 15.2 cm.  Colonic diverticulosis.  Suspected hepatic cysts unchanged.  13 mm high attenuation nodule adjacent to thickened gastroesophageal junction, question contained perforation versus epiphrenic diverticulum containing contrast, new since 01/17/2014 but unchanged since 03/07/2014.  Small incisional hernia containing a portion of the transverse colon wall without obstruction.   Electronically Signed   By: Ulyses Southward M.D.   On: 03/12/2014 23:12   Ct Head Wo Contrast  03/22/2014   CLINICAL DATA:  Acute onset of slurred speech.  Initial encounter.  EXAM: CT HEAD WITHOUT CONTRAST  TECHNIQUE: Contiguous axial images were obtained from the base of the skull through the vertex without intravenous contrast.  COMPARISON:  CT of the head performed 12/23/2013, and MRI of the brain performed 12/24/2013  FINDINGS: There is no evidence of acute infarction, mass lesion, or intra- or extra-axial hemorrhage on CT.  Prominence of the ventricles and sulci reflects mild to moderate cortical volume loss. Cerebellar atrophy is noted. Scattered periventricular and subcortical white matter change likely reflects small vessel ischemic microangiopathy. A chronic infarct is noted at the left cerebellar hemisphere.  The brainstem and fourth ventricle are within normal limits. The basal ganglia are unremarkable in appearance. The cerebral hemispheres demonstrate grossly normal gray-white differentiation. No mass effect or midline  shift is seen.  There is no evidence of fracture; visualized osseous structures are unremarkable in appearance. The visualized portions of the orbits are within normal limits. The paranasal sinuses and mastoid air cells are  well-aerated. No significant soft tissue abnormalities are seen.  IMPRESSION: 1. No acute intracranial pathology seen on CT. 2. Mild to moderate cortical volume loss and scattered small vessel ischemic microangiopathy. 3. 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   Electronically Signed   By: Roanna Raider M.D.   On: 03/22/2014 03:30   Ct Abdomen Pelvis W Contrast  03/07/2014   CLINICAL DATA:  80 year old female presenting with rectal pressure gradually worsening over the prior month.  EXAM: CT ABDOMEN AND PELVIS WITH CONTRAST  TECHNIQUE: Multidetector CT imaging of the abdomen and pelvis was performed using the standard protocol following bolus administration of intravenous contrast.  CONTRAST:  50mL OMNIPAQUE IOHEXOL 300 MG/ML SOLN, 80mL OMNIPAQUE IOHEXOL 300 MG/ML SOLN  COMPARISON:  CT of the abdomen and pelvis 01/17/2014.  FINDINGS: Lower chest: Cardiomegaly with biatrial dilatation. Calcifications of the aortic valve. Atherosclerotic calcifications in the left main and left circumflex coronary arteries. Small hiatal hernia.  Hepatobiliary: Multifocal low-attenuation lesions appear unchanged in size, number and distribution scattered throughout the liver. Many these are too small to definitively characterize, while the larger lesions are intermediate attenuation, favored to represent proteinaceous cysts. The largest of these lesions measures only 12 mm in segment 2 (image 21 of series 2). No new hepatic lesions are noted. No intra or extrahepatic biliary ductal dilatation. Status post cholecystectomy.  Pancreas: Unremarkable.  Spleen: Unremarkable.  Adrenals/Urinary Tract: Again noted is a 1.2 cm intermediate attenuation (23 HU) lesion in the lower pole of the left kidney, with  some surrounding architectural distortion, favored to represent a previously ruptured proteinaceous cyst. No suspicious renal lesions are noted. Multifocal cortical thinning in the kidneys bilaterally, presumably mild scarring. No hydroureteronephrosis. Urinary bladder is normal in appearance. Bilateral adrenal glands are normal in appearance.  Stomach/Bowel: As with the prior examination there is extensive thickening of the gastric wall in the antral pre-pyloric region of the stomach. The pylorus is also markedly thickened (up to 15 mm in thickness). The stomach is moderately distended. No pathologic dilatation of small bowel or colon. Postoperative changes of partial colectomy near the rectosigmoid junction are noted. A few scattered colonic diverticulae are noted, without surrounding inflammatory changes to suggest an acute diverticulitis at this time. The distal rectum and anus are grossly unremarkable in appearance.  Vascular/Lymphatic: Extensive atherosclerosis throughout the abdominal and pelvic vasculature, without evidence of aneurysm or dissection. No lymphadenopathy noted in the abdomen or pelvis.  Reproductive: Retroverted uterus.  Ovaries are atrophic.  Other: No significant volume of ascites.  No pneumoperitoneum.  Musculoskeletal: Tiny ventral hernia, through which there is incomplete herniation of the mid transverse colon; no findings to suggest associated bowel incarceration or obstruction at this time. Old compression fracture of superior endplate of T11 with approximately 20% loss of anterior vertebral body height (unchanged). There are no aggressive appearing lytic or blastic lesions noted in the visualized portions of the skeleton.  IMPRESSION: 1. No findings to account for the patient's worsening rectal pressure. 2. There is persistent thickening of the pylorus and antral pre-pyloric region of the stomach, which could be inflammatory in the setting of ulcer disease, or could be neoplastic.  There is persistent moderate dilatation of the stomach, suggesting some degree of gastric outlet obstruction. Correlation with results of recent upper endoscopy is suggested. 3. Additional incidental findings, as above, similar to prior examination.   Electronically Signed   By: Trudie Reed M.D.   On: 03/07/2014 16:28   Dg Chest Port 1 View  03/14/2014  CLINICAL DATA:  80 year old female with new onset chest pain beginning today  EXAM: PORTABLE CHEST - 1 VIEW  COMPARISON:  None.  FINDINGS: Cardiomegaly. Atherosclerotic calcification present in the transverse aorta. No focal airspace consolidation, pulmonary edema, pneumothorax. a query trace left pleural effusion. No acute osseous abnormality. The left glenohumeral joint osteoarthritis. Mild coarsening of the interstitial markings.  IMPRESSION: 1. Query trace left pleural effusion. 2. Cardiomegaly. 3. Aortic atherosclerosis.   Electronically Signed   By: Malachy Moan M.D.   On: 03/14/2014 10:12   Dg Abd Portable 1v  03/15/2014   CLINICAL DATA:  Nasogastric tube placement.  EXAM: PORTABLE ABDOMEN - 1 VIEW  COMPARISON:  03/14/2014  FINDINGS: The nasogastric tube is in place, tip coiled within the stomach and tip overlying the region of the fundus. Surgical clips are noted in the right upper quadrant. There is residual contrast in the colon and distal small bowel following esophagram on 03/13/2014. Bowel gas pattern is nonobstructive.  IMPRESSION: Nasogastric tube coiled within the stomach.   Electronically Signed   By: Rosalie Gums M.D.   On: 03/15/2014 10:49   Dg Abd Portable 2v  03/14/2014   CLINICAL DATA:  80 year old female with persistent abdominal pain and fullness.  EXAM: PORTABLE ABDOMEN - 2 VIEW  COMPARISON:  Esophagram performed yesterday. CT abdomen/ pelvis 03/12/2014  FINDINGS: No evidence of free air on the lateral decubitus view. Contrast material is noted within the stomach and scattered throughout the large bowel. There is no  evidence of obstruction. The volume of contrast material remaining within the stomach is greater than expected. The bones are diffusely osteopenic. Surgical clips in the right upper quadrant suggest prior cholecystectomy.  IMPRESSION: 1. No evidence of obstruction or free air. 2. Greater than expected volume of retained oral contrast within the stomach. Query clinical history of delayed gastric emptying or gastroparesis? 3. The bones appear osteopenic.   Electronically Signed   By: Malachy Moan M.D.   On: 03/14/2014 10:04   Dg Esophagus W/water Sol Cm  03/13/2014   CLINICAL DATA:  Abnormal CT, evaluate for esophageal perforation  EXAM: ESOPHOGRAM/BARIUM SWALLOW  TECHNIQUE: Single contrast examination was performed using water-soluble contrast.  FLUOROSCOPY TIME:  1 min 7 seconds  COMPARISON:  CT abdomen pelvis dated 03/12/2014 and 03/07/2014  FINDINGS: Esophageal dysmotility.  No fixed esophageal narrowing/stricture.  Suspected small paraesophageal hernia (less likely hiatal hernia or esophageal diverticulum) at the GE junction. The appearance does not suggest a contained esophageal perforation.  Presence of gastroesophageal reflux could not be assessed due to the patient's inability to clear her esophagus.  Moderate food/debris in the stomach.  IMPRESSION: Esophageal dysmotility.  No fixed esophageal narrowing/stricture.  Suspected small paraesophageal hernia at the GE junction. No evidence of contained esophageal perforation.  Moderate food/debris in the stomach.   Electronically Signed   By: Charline Bills M.D.   On: 03/13/2014 09:44         Subjective: Patient had an episode of slurred speech last night. She is encephalopathic this morning and more somnolent. She is arousable and occasionally follows one-step commands. Denies any chest pain or shortness breath. There is no vomiting, diarrhea or respiratory distress.  Objective: Filed Vitals:   03/22/14 0700 03/22/14 0730 03/22/14 0800  03/22/14 0830  BP:   164/57   Pulse: 57 74 61 62  Temp:   98.4 F (36.9 C)   TempSrc:   Oral   Resp: 13 19 17 13   Height:  Weight:      SpO2: 100% 99% 98% 99%    Intake/Output Summary (Last 24 hours) at 03/22/14 4034 Last data filed at 03/22/14 0800  Gross per 24 hour  Intake 293.14 ml  Output    851 ml  Net -557.86 ml   Weight change: -2 kg (-4 lb 6.6 oz) Exam:   General:  Pt is somnolent but arouses. Occasionally follows one-step commands.not in acute distress  HEENT: No icterus, No thrush, [place]/AT  Cardiovascular: RRR, S1/S2, no rubs, no gallops  Respiratory: CTA bilaterally, no wheezing, no crackles, no rhonchi  Abdomen: Soft/+BS, non tender, non distended, no guarding  Extremities: No edema, No lymphangitis, No petechiae, No rashes, no synovitis  Data Reviewed: Basic Metabolic Panel:  Recent Labs Lab 03/16/14 0432 03/17/14 0418 03/18/14 0615 03/19/14 0520 03/22/14 0339  NA 130* 125* 126* 132* 127*  K 3.8 3.5* 4.3 4.0 4.4  CL 95* 92* 93* 100 92*  CO2 19 20 20 19 21   GLUCOSE 134* 179* 116* 120* 128*  BUN 12 9 7 6  4*  CREATININE 1.04 0.97 0.96 0.94 0.95  CALCIUM 10.0 9.3 9.4 9.3 9.7  MG  --   --  1.5  --   --    Liver Function Tests:  Recent Labs Lab 03/16/14 0432  AST 19  ALT 12  ALKPHOS 74  BILITOT 0.6  PROT 7.6  ALBUMIN 3.6   No results for input(s): LIPASE, AMYLASE in the last 168 hours. No results for input(s): AMMONIA in the last 168 hours. CBC:  Recent Labs Lab 03/17/14 0418 03/18/14 0615 03/19/14 0520 03/21/14 0528 03/22/14 0339  WBC 6.1 5.5 5.2 4.4 4.7  HGB 10.1* 10.1* 9.7* 8.8* 9.9*  HCT 28.7* 29.0* 27.6* 25.0* 28.9*  MCV 87.0 88.1 88.5 88.3 87.6  PLT 298 268 309 303 341   Cardiac Enzymes:  Recent Labs Lab 03/16/14 0805 03/16/14 1405 03/16/14 2100  TROPONINI <0.30 <0.30 <0.30   BNP: Invalid input(s): POCBNP CBG:  Recent Labs Lab 03/17/14 0032 03/17/14 0453 03/17/14 0743 03/17/14 1325 03/17/14 1612    GLUCAP 164* 157* 169* 121* 120*    Recent Results (from the past 240 hour(s))  Urine culture     Status: None   Collection Time: 03/12/14  7:47 PM  Result Value Ref Range Status   Specimen Description URINE, RANDOM  Final   Special Requests NONE  Final   Culture  Setup Time   Final    03/13/2014 01:31 Performed at [facility] Count   Final    50,000 COLONIES/ML Performed at [facility]    Culture   Final    Multiple bacterial morphotypes present, none predominant. Suggest appropriate recollection if clinically indicated. Performed at [facility]    Report Status 03/13/2014 FINAL  Final  MRSA PCR Screening     Status: None   Collection Time: 03/16/14 12:36 PM  Result Value Ref Range Status   MRSA by PCR NEGATIVE NEGATIVE Final    Comment:        The GeneXpert MRSA Assay (FDA approved for NASAL specimens only), is one component of a comprehensive MRSA colonization surveillance program. It is not intended to diagnose MRSA infection nor to guide or monitor treatment for MRSA infections.      Scheduled Meds: . amLODipine  10 mg Oral Daily  . antiseptic oral rinse  7 mL Mouth Rinse q12n4p  . aspirin  300 mg Rectal Daily  . chlorhexidine  15 mL Mouth Rinse BID  . cloNIDine  0.3 mg Transdermal Weekly  . cloNIDine  0.2 mg Oral TID  . cycloSPORINE  1 drop Both Eyes BID  . hydrALAZINE  10 mg Intravenous 4 times per day  . labetalol  20 mg Intravenous Q6H  . latanoprost  1 drop Right Eye QHS  . levothyroxine  87.5 mcg Intravenous Daily  . metoCLOPramide (REGLAN) injection  10 mg Intravenous 3 times per day  . nitroGLYCERIN  0.2 mg Transdermal Daily  . pantoprazole (PROTONIX) IV  40 mg Intravenous Q12H   Continuous Infusions: . nitroGLYCERIN 10 mcg/min (03/22/14 0800)  . sodium chloride 0.9 % 1,000 mL with potassium chloride 20 mEq infusion       Dacia Capers, DO  [facility] Pager [phone]  If 7PM-7AM, please contact  night-coverage [email or web address] Password [email or web address] 03/22/2014, 9:17 AM   LOS: 10 days

## 2014-03-22 NOTE — Progress Notes (Signed)
Sodium of 127 this morning. Shannon [name] with [facility] called to notify. No new orders given. Pt is stable. Will continue to monitor.

## 2014-03-22 NOTE — Consult Note (Signed)
Reason for Consult:Altered mental status Referring Physician: Tat  CC: Altered mental status  HPI: Shannon [name] is an 80 y.o. female admitted on 03/13/14 with complaints of abdominal pain.  Course has been complicated by hypertensive urgency.  Patient developed lethargy and this morning awakened with slurred speech.  Patient was sent for CT and on return speech had cleared.  BP was controlled at that time and documented at 140/53. Patient has had no further complaints of slurred speech but reports being tired.   Hospitalization has also been complicated by hyponatremia and UTI.    Past Medical History  Diagnosis Date  . Pneumonia   . Thyroid disease   . Seizures   . Stroke   . Hyperlipidemia   . Hypertension   . Diabetes mellitus without complication   . Small bowel obstruction   . Ventral hernia with bowel obstruction     Past Surgical History  Procedure Laterality Date  . Exploratory laparotomy  09-07-2013  . Cholecystectomy    . Tubal ligation    . Colectomy    . Hernia repair  2015  . Esophagogastroduodenoscopy N/A 01/19/2014    Procedure: ESOPHAGOGASTRODUODENOSCOPY (EGD);  Surgeon: Irene Shipper, MD;  Location: [facility];  Service: Endoscopy;  Laterality: N/A;  . Esophagogastroduodenoscopy N/A 01/20/2014    Procedure: ESOPHAGOGASTRODUODENOSCOPY (EGD);  Surgeon: Irene Shipper, MD;  Location: [facility];  Service: Endoscopy;  Laterality: N/A;  . Esophagogastroduodenoscopy N/A 03/19/2014    Procedure: ESOPHAGOGASTRODUODENOSCOPY (EGD);  Surgeon: Milus Banister, MD;  Location: [facility];  Service: Endoscopy;  Laterality: N/A;    Family History  Problem Relation Age of Onset  . Stroke Mother   . Heart attack Neg Hx   . Hypertension Mother   . Diabetes Brother     Social History:  reports that she has never smoked. She does not have any smokeless tobacco history on file. She reports that she does not drink alcohol or use illicit drugs.  No Known  Allergies  Medications:  I have reviewed the patient's current medications. Prior to Admission:  Prescriptions prior to admission  Medication Sig Dispense Refill Last Dose  . acetaminophen (TYLENOL EX ST ARTHRITIS PAIN) 500 MG tablet Take 500 mg by mouth every 6 (six) hours as needed (for arthritis pain).   Past Month at Unknown time  . aspirin EC 81 MG tablet Take 81 mg by mouth every morning.   03/11/2014 at Unknown time  . atorvastatin (LIPITOR) 10 MG tablet Take 10 mg by mouth daily at 6 PM.   03/12/2014 at Unknown time  . B Complex-C (B-COMPLEX WITH VITAMIN C) tablet Take 1 tablet by mouth 2 (two) times daily.   03/12/2014 at Unknown time  . calcium-vitamin D (OSCAL WITH D) 500-200 MG-UNIT per tablet Take 1 tablet by mouth 2 (two) times daily.   03/11/2014 at Unknown time  . cycloSPORINE (RESTASIS) 0.05 % ophthalmic emulsion Place 1 drop into both eyes 2 (two) times daily.   03/12/2014 at Unknown time  . Dietary Management Product (VASCULERA) TABS Take 1 tablet by mouth daily.   03/12/2014 at Unknown time  . fluticasone (FLONASE) 50 MCG/ACT nasal spray Place 2 sprays into both nostrils daily.   03/11/2014 at Unknown time  . gabapentin (NEURONTIN) 300 MG capsule Take 300 mg by mouth 3 (three) times daily.   Past Week at Unknown time  . hydrALAZINE (APRESOLINE) 100 MG tablet Take 100 mg by mouth 3 (three) times daily.   03/12/2014 at Unknown time  .  isosorbide mononitrate (IMDUR) 60 MG 24 hr tablet Take 60 mg by mouth every morning.   03/12/2014 at Unknown time  . l-methylfolate-B6-B12 (METANX) 3-35-2 MG TABS Take 1 tablet by mouth every morning.   03/12/2014 at Unknown time  . levothyroxine (SYNTHROID, LEVOTHROID) 175 MCG tablet Take 175 mcg by mouth daily before breakfast.   03/12/2014 at Unknown time  . metoprolol succinate (TOPROL-XL) 50 MG 24 hr tablet Take 50 mg by mouth every morning. Take with or immediately following a meal.   03/12/2014 at 9am  . Multiple Vitamins-Minerals  (MULTIVITAMIN WITH MINERALS) tablet Take 1 tablet by mouth every morning.    03/11/2014 at Unknown time  . pantoprazole (PROTONIX) 40 MG tablet Take 40 mg by mouth 2 (two) times daily before a meal.   03/12/2014 at Unknown time  . polyethylene glycol (MIRALAX / GLYCOLAX) packet Take 17 g by mouth daily as needed for mild constipation.   03/12/2014 at Unknown time  . sennosides-docusate sodium (SENOKOT-S) 8.6-50 MG tablet Take 1 tablet by mouth 2 (two) times daily.    03/12/2014 at Unknown time  . sertraline (ZOLOFT) 100 MG tablet Take 100 mg by mouth every morning.    03/12/2014 at Unknown time  . sucralfate (CARAFATE) 1 G tablet Take 1 g by mouth 3 (three) times daily with meals.   03/11/2014 at Unknown time  . Calcium Carbonate (CALTRATE 600 PO) Take 600 mg by mouth 2 (two) times daily.    Taking  . traMADol (ULTRAM) 50 MG tablet Take 50 mg by mouth 2 (two) times daily as needed for moderate pain.    Taking   Scheduled: . amLODipine  10 mg Oral Daily  . antiseptic oral rinse  7 mL Mouth Rinse q12n4p  . aspirin  300 mg Rectal Daily  . chlorhexidine  15 mL Mouth Rinse BID  . cloNIDine  0.3 mg Transdermal Weekly  . cloNIDine  0.2 mg Oral TID  . cycloSPORINE  1 drop Both Eyes BID  . hydrALAZINE  10 mg Intravenous 4 times per day  . labetalol  20 mg Intravenous Q6H  . latanoprost  1 drop Right Eye QHS  . levothyroxine  87.5 mcg Intravenous Daily  . metoCLOPramide (REGLAN) injection  10 mg Intravenous 3 times per day  . nitroGLYCERIN  0.2 mg Transdermal Daily  . pantoprazole (PROTONIX) IV  40 mg Intravenous Q12H    ROS: History obtained from the patient  General ROS: fatigue Psychological ROS: negative for - behavioral disorder, hallucinations, memory difficulties, mood swings or suicidal ideation Ophthalmic ROS: negative for - blurry vision, double vision, eye pain or loss of vision ENT ROS: negative for - epistaxis, nasal discharge, oral lesions, sore throat, tinnitus or vertigo Allergy  and Immunology ROS: negative for - hives or itchy/watery eyes Hematological and Lymphatic ROS: negative for - bleeding problems, bruising or swollen lymph nodes Endocrine ROS: negative for - galactorrhea, hair pattern changes, polydipsia/polyuria or temperature intolerance Respiratory ROS: negative for - cough, hemoptysis, shortness of breath or wheezing Cardiovascular ROS: negative for - chest pain, dyspnea on exertion, edema or irregular heartbeat Gastrointestinal ROS: abdominal pain, nausea/vomiting  Genito-Urinary ROS: negative for - dysuria, hematuria, incontinence or urinary frequency/urgency Musculoskeletal ROS: negative for - joint swelling or muscular weakness Neurological ROS: as noted in HPI Dermatological ROS: negative for rash and skin lesion changes  Physical Examination: Blood pressure 195/74, pulse 65, temperature 98.4 F (36.9 C), temperature source Oral, resp. rate 16, height 5\' 6"  (1.676 m), weight 64  kg (141 lb 1.5 oz), SpO2 100 %.  HEENT-  Normocephalic, no lesions, without obvious abnormality.  Normal external eye and conjunctiva.  Normal TM's bilaterally.  Normal auditory canals and external ears. Normal external nose, mucus membranes and septum.  Normal pharynx. Cardiovascular- S1, S2 normal, pulses palpable throughout   Lungs- chest clear, no wheezing, rales, normal symmetric air entry Abdomen- soft, non-tender; bowel sounds normal; no masses,  no organomegaly Extremities- no edema Lymph-no adenopathy palpable Musculoskeletal-no joint tenderness, deformity or swelling Skin-discoloration of the hands and feet  Neurological Examination Mental Status: Lethargic, oriented, thought content appropriate.  Speech fluent without evidence of aphasia.  Able to follow 3 step commands without difficulty. Cranial Nerves: II: Discs flat bilaterally; Visual fields grossly normal, pupils equal, round, reactive to light and accommodation III,IV, VI: ptosis not present,  extra-ocular motions intact bilaterally V,VII: smile symmetric, facial light touch sensation normal bilaterally VIII: hearing normal bilaterally IX,X: gag reflex present XI: bilateral shoulder shrug XII: midline tongue extension Motor: Generalized weakness with patient able to lift her extremities off the bed without focality noted Sensory: Pinprick and light touch intact throughout, bilaterally Deep Tendon Reflexes: 2+ and symmetric throughout with absent AJ's bilaterally Plantars: Right: mute   Left: mute Cerebellar: normal finger-to-nose and normal heel-to-shin testing bilaterally    Laboratory Studies:   Basic Metabolic Panel:  Recent Labs Lab 03/16/14 0432 03/17/14 0418 03/18/14 0615 03/19/14 0520 03/22/14 0339  NA 130* 125* 126* 132* 127*  K 3.8 3.5* 4.3 4.0 4.4  CL 95* 92* 93* 100 92*  CO2 19 20 20 19 21   GLUCOSE 134* 179* 116* 120* 128*  BUN 12 9 7 6  4*  CREATININE 1.04 0.97 0.96 0.94 0.95  CALCIUM 10.0 9.3 9.4 9.3 9.7  MG  --   --  1.5  --   --     Liver Function Tests:  Recent Labs Lab 03/16/14 0432  AST 19  ALT 12  ALKPHOS 74  BILITOT 0.6  PROT 7.6  ALBUMIN 3.6   No results for input(s): LIPASE, AMYLASE in the last 168 hours. No results for input(s): AMMONIA in the last 168 hours.  CBC:  Recent Labs Lab 03/17/14 0418 03/18/14 0615 03/19/14 0520 03/21/14 0528 03/22/14 0339  WBC 6.1 5.5 5.2 4.4 4.7  HGB 10.1* 10.1* 9.7* 8.8* 9.9*  HCT 28.7* 29.0* 27.6* 25.0* 28.9*  MCV 87.0 88.1 88.5 88.3 87.6  PLT 298 268 309 303 341    Cardiac Enzymes:  Recent Labs Lab 03/16/14 0805 03/16/14 1405 03/16/14 2100  TROPONINI <0.30 <0.30 <0.30    BNP: Invalid input(s): POCBNP  CBG:  Recent Labs Lab 03/17/14 0032 03/17/14 0453 03/17/14 0743 03/17/14 1325 03/17/14 1612  GLUCAP 164* 157* 169* 121* 120*    Microbiology: Results for orders placed or performed during the hospital encounter of 03/12/14  Urine culture     Status: None    Collection Time: 03/12/14  7:47 PM  Result Value Ref Range Status   Specimen Description URINE, RANDOM  Final   Special Requests NONE  Final   Culture  Setup Time   Final    03/13/2014 01:31 Performed at [place]   Final    50,000 COLONIES/ML Performed at [facility]    Culture   Final    Multiple bacterial morphotypes present, none predominant. Suggest appropriate recollection if clinically indicated. Performed at [facility]    Report Status 03/13/2014 FINAL  Final  MRSA  PCR Screening     Status: None   Collection Time: 03/16/14 12:36 PM  Result Value Ref Range Status   MRSA by PCR NEGATIVE NEGATIVE Final    Comment:        The GeneXpert MRSA Assay (FDA approved for NASAL specimens only), is one component of a comprehensive MRSA colonization surveillance program. It is not intended to diagnose MRSA infection nor to guide or monitor treatment for MRSA infections.     Coagulation Studies: No results for input(s): LABPROT, INR in the last 72 hours.  Urinalysis:  Recent Labs Lab 03/22/14 1000  COLORURINE YELLOW  LABSPEC 1.009  PHURINE 7.0  GLUCOSEU NEGATIVE  HGBUR NEGATIVE  BILIRUBINUR NEGATIVE  KETONESUR NEGATIVE  PROTEINUR NEGATIVE  UROBILINOGEN 0.2  NITRITE NEGATIVE  LEUKOCYTESUR NEGATIVE    Lipid Panel:     Component Value Date/Time   CHOL 110 12/24/2013 0456   TRIG 130 12/24/2013 0456   HDL 58 12/24/2013 0456   CHOLHDL 1.9 12/24/2013 0456   VLDL 26 12/24/2013 0456   LDLCALC 26 12/24/2013 0456    HgbA1C:  Lab Results  Component Value Date   HGBA1C 6.9* 12/24/2013    Urine Drug Screen:  No results found for: LABOPIA, COCAINSCRNUR, LABBENZ, AMPHETMU, THCU, LABBARB  Alcohol Level: No results for input(s): ETH in the last 168 hours.  Other results: EKG: normal sinus rhythm at 70 bpm.  Imaging: Ct Head Wo Contrast  03/22/2014   CLINICAL DATA:  Acute onset of slurred speech.  Initial encounter.   EXAM: CT HEAD WITHOUT CONTRAST  TECHNIQUE: Contiguous axial images were obtained from the base of the skull through the vertex without intravenous contrast.  COMPARISON:  CT of the head performed 12/23/2013, and MRI of the brain performed 12/24/2013  FINDINGS: There is no evidence of acute infarction, mass lesion, or intra- or extra-axial hemorrhage on CT.  Prominence of the ventricles and sulci reflects mild to moderate cortical volume loss. Cerebellar atrophy is noted. Scattered periventricular and subcortical white matter change likely reflects small vessel ischemic microangiopathy. A chronic infarct is noted at the left cerebellar hemisphere.  The brainstem and fourth ventricle are within normal limits. The basal ganglia are unremarkable in appearance. The cerebral hemispheres demonstrate grossly normal gray-white differentiation. No mass effect or midline shift is seen.  There is no evidence of fracture; visualized osseous structures are unremarkable in appearance. The visualized portions of the orbits are within normal limits. The paranasal sinuses and mastoid air cells are well-aerated. No significant soft tissue abnormalities are seen.  IMPRESSION: 1. No acute intracranial pathology seen on CT. 2. Mild to moderate cortical volume loss and scattered small vessel ischemic microangiopathy. 3. 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   Electronically Signed   By: Garald Balding M.D.   On: 03/22/2014 03:30     Assessment/Plan: 80 year old female with altered mental status.  Neurological examination shows no evidence of focal abnormalities.  Head CT personally reviewed and shows no acute changes.  Patient on ASA daily.  Suspect altered mental status likely multifactorial and related to metabolic abnormalities, infection, thyroid abnormalities and hypertension.    Recommendations: 1.  Continue ASA daily 2.  MRI of the brain without contrast.  Unless acute abnormalities noted no further  neurological work up recommended.    Alexis Goodell, MD [facility] [phone] 03/22/2014, 4:18 PM

## 2014-03-22 NOTE — Progress Notes (Signed)
[facility] hospitalist progress note. Chief complaint. Slurred speech. History of present illness. This 80 year old female in hospital with gastric outlet obstruction, hypertensive urgency, atypical chest pain, etc. Patient does carry a past history of stroke. I was notified by nursing that when they awoken patient this morning they found her speech to be slurred. I immediately sent the patient for a CT scan of the head without contrast. Patient is now back from CT scan and the result indicated no acute intracranial pathology. Mild/moderate cortical volume loss with scattered small vessel ischemic micral angiopathy. 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 I came to see the patient at bedside and found her alert and speech clear. Vital signs. Temperature 97.8, pulse 75, respiration 18, blood pressure currently 140/53. General appearance. Frail elderly female who is alert and in no distress. Cardiac. Rate and rhythm regular. Lungs. Breath sounds are clear but reduced in the bases with poor inspiratory effort. Abdomen. Soft with bowel sounds. Neurologic. Cranial nerves II through XII grossly intact. Speech sounds clear. No unilateral or focal defects. Impression/plan. Problem #1. Slurred speech. Unclear if this actually represents TIA with the patient was simply sleepy after being awakened. The CT scan of the brain unremarkable. No current neurologic changes identified per bedside exam. I will discuss the case with neurology on-call.

## 2014-03-22 NOTE — Progress Notes (Signed)
Shannon [name] with [facility] called regarding swelling in patients left hand. Pts left hand is noticeably more swollen than right hand. Their is an Arterial line in the left radial artery. The artery has a good palpable pulse and the extremity is warm and has capillary refill less than 3 seconds. The waveform for the A-line is appropriate and blood return is noted when pulled back. [name] told RN to continue to monitor for now and call with any changes. Pt is stable. Will continue to monitor.

## 2014-03-23 ENCOUNTER — Inpatient Hospital Stay (HOSPITAL_COMMUNITY): Payer: Medicare Other

## 2014-03-23 DIAGNOSIS — E871 Hypo-osmolality and hyponatremia: Secondary | ICD-10-CM

## 2014-03-23 LAB — URINE CULTURE
Colony Count: NO GROWTH
Culture: NO GROWTH

## 2014-03-23 LAB — CBC
HCT: 29 % — ABNORMAL LOW (ref 36.0–46.0)
Hemoglobin: 10.2 g/dL — ABNORMAL LOW (ref 12.0–15.0)
MCH: 30.7 pg (ref 26.0–34.0)
MCHC: 35.2 g/dL (ref 30.0–36.0)
MCV: 87.3 fL (ref 78.0–100.0)
Platelets: 373 10*3/uL (ref 150–400)
RBC: 3.32 MIL/uL — ABNORMAL LOW (ref 3.87–5.11)
RDW: 15.7 % — ABNORMAL HIGH (ref 11.5–15.5)
WBC: 4 10*3/uL (ref 4.0–10.5)

## 2014-03-23 LAB — BASIC METABOLIC PANEL
Anion gap: 12 (ref 5–15)
BUN: 5 mg/dL — ABNORMAL LOW (ref 6–23)
CO2: 21 mEq/L (ref 19–32)
Calcium: 9.4 mg/dL (ref 8.4–10.5)
Chloride: 92 mEq/L — ABNORMAL LOW (ref 96–112)
Creatinine, Ser: 0.94 mg/dL (ref 0.50–1.10)
GFR calc Af Amer: 61 mL/min — ABNORMAL LOW (ref >90)
GFR calc non Af Amer: 53 mL/min — ABNORMAL LOW (ref >90)
Glucose, Bld: 130 mg/dL — ABNORMAL HIGH (ref 70–99)
Potassium: 4.6 mEq/L (ref 3.7–5.3)
Sodium: 125 mEq/L — ABNORMAL LOW (ref 137–147)

## 2014-03-23 MED ORDER — LEVOTHYROXINE SODIUM 100 MCG IV SOLR
100.0000 ug | Freq: Every day | INTRAVENOUS | Status: DC
  Administered 2014-03-23 – 2014-03-26 (×3): 100 ug via INTRAVENOUS
  Filled 2014-03-23 (×4): qty 5

## 2014-03-23 MED ORDER — BOOST PLUS PO LIQD
237.0000 mL | Freq: Two times a day (BID) | ORAL | Status: DC
  Administered 2014-03-23 – 2014-03-26 (×6): 237 mL via ORAL
  Filled 2014-03-23 (×9): qty 237

## 2014-03-23 NOTE — Progress Notes (Signed)
PROGRESS NOTE  Shannon [name] WUJ:[id number] DOB: 01/23/1924 DOA: 03/12/2014 PCP: Oneal Grout, MD  Brief history 80 y.o. female with remote gastro-jejunostomy for peptic ulcer disease. She unfortunately had a perforation of an anastomotic ulcer at the gastro-jejunal anastomosis in June 2015 and underwent surgery at [facility].Patient was hospitalized here in October 2015 with abdominal pain nausea and vomiting and had endoscopy done twice during that admission with finding of a gastric bezoar.She was discharged home on a pured diet and PPI therapy.She unfortunately present on 12/10 with abd pain/vomiting, CT scan of abdomen showed persistent marked thickening of the pyloric channel and prepyloric antrum and a large filling defect in the stomach measuring 12 x 10 x 15 cm consistent with a bezoar.She was admitted for further evaluation and treatment. GI and Gen Surgery have been consulted.  Course complicated with hypertensive urgency and patient is requiring NTG drip. On 03/22/2014, the patient became encephalopathic and had a transient episode of slurred speech. She had no other focal deficits. Neurology was consulted and recommended MRI of the brain. As the patient's family does not want any further surgical intervention, general surgery has signed off.  Assessment/Plan: Gastric Outlet Obstruction: -Initially some question of perforation at GE junction on CT Scan, but this was ruled out on Barium Esophagogram.  -continues to feel bloated/belching-having chest pain-suspect from upward pressure from outlet obstruction- troponin neg X5 -NGT for decompression--removed 03/17/14-- no nausea or vomiting. (But reports some discomfort in her epigastric/LUQ area) -03/19/14--EGD demonstrated incomplete gastric outlet obstruction from distal tortuous/anatomically abnormal distal stomach from surgery (gastrojejunostomy).  -surgery consulted and will follow rec's (Family do not want any more  surgeries) -General surgery has signed off as the patient's family does not want any further surgery -plans for SBFT noted  -Appreciate GI follow-up -May need to discuss GJ tube pending SBFT HTN Urgency  -Patient was transferred to SDU on 03/16/14 and started on NTG drip -BP still elevated and requiring NTG drip -continue schedule hydralazine, continue labetalol 20mg  Q6H and will also add clonidine (solution TID; as patient is able to tolerate clear liquid) -03/21/14 add amlodipine suspension  -03/22/14 start clonidine TTS-3 patch-->wean oral clonidine over next 2-3 days as it will take 48-72hrs for patch to take full effect -03/22/14--start nitro patch -will titrate off NTG drip as tolerated--titratefor SBP <170 and DBP<100 -BP will be easier to control once able to take PO meds. -check renin/aldosterone ratio--pending  Acute encephalopathy -03/22/2014 patient is more somnolent -03/23/14--back to baseline -Likely multifactorial which may include hyponatremia, hypertensive encephalopathy, PRES, UTI, or new cerebral ischemia -EKG -MRI brain without contrast--no acute changes -check TSH--27.660-->increase synthroid dose to IV daily -serum B12-->2000 -RBC folate-->1000 -UA --no pyuria -continue to titrate nitro drip for SBP<170 -start NS at 75cc/hr -d/c all opioids and hypnotics  Remote hx of Gastrojejunostomy with perforation of anastomotic ulcer in June 2015-s/p revision/laparotomy -general surgery consulted--plans noted for SBFT -continue liquid diet for now and use of reglan  -Patient is passing flatus and having small bowel movements  Atypical Chest pain  -Troponins negative 3  -resolved -EKG with nonspecific changes -most likely secondary to GERD and hypertensive urgency -continue PPI and BP control   Diabetes mellitus type 2 -12/24/2013 hemoglobin A1c 6.9 -Given the patient's age and current recommendations, will allow for Liberal gylcemic -will monitor  for now -low carb diet when able to tolerate PO's  hyponatremia  -Secondary to hypotonic fluid given on admission previously -urine  and serum osm--suggest a degree of SIADH -restart NS--increase rate to 100cc/hr  CKD stage 3 -baseline creatinine 0.9-1.3 -appears to remained stable/at baseline currently -will monitor  hypokalemia -repletion/maintenance on IVF  -MG level WNL  Hypothyroidism: -continue with IV Levothyroxine for now--increase dose to IV daily -TSH 27.660  Hx CVA ASA Suppositories until able to fully take po No new deficits appreciated   Depression:  -since NPO-all meds on hold-resume when able to fully tolerate by mouth meds    Family Communication: family updated Disposition Plan: Home when medically stable    Procedures/Studies: Ct Abdomen Pelvis Wo Contrast  03/12/2014   CLINICAL DATA:  Constant vomiting today, constipated since Saturday, RIGHT lower quadrant rectal pain, history diabetes, hypertension, prior gastric bezoar  EXAM: CT ABDOMEN AND PELVIS WITHOUT CONTRAST  TECHNIQUE: Multidetector CT imaging of the abdomen and pelvis was performed following the standard protocol without IV contrast. Sagittal and coronal MPR images reconstructed from axial data set.  COMPARISON:  03/07/2014  FINDINGS: Minimal bibasilar atelectasis.  Low-attenuation foci within liver, question cysts, unchanged.  Metallic foreign body at LEFT ventricle again identified, uncertain etiology.  Gallbladder surgically absent.  Probable tiny LEFT renal cyst 9 mm diameter.  Within limits of a nonenhanced exam, remainder of liver, spleen, pancreas, kidneys and adrenal glands normal appearance.  Persistent marked thickening of the pyloric channel and pre pyloric antrum gastric distention question partial gastric outlet obstruction.  Small amount of ingested contrast is present within proximal small bowel loops.  Large filling defect within the stomach, 12.2 x 10.0 x 15.2 cm,  containing bubbly gas, consistent with recurrent bezoar.  Scattered colonic diverticula.  No evidence of bowel obstruction or dilatation.  Extensive atherosclerotic calcifications aorta and minimally in coronary arteries.  High attenuation nodule 13 mm diameter again identified adjacent to the thickened gastroesophageal junction, unchanged but since 03/07/2014 but new since 01/17/2014, question contrast within epiphrenic diverticulum or small focal contained perforation, unlikely calcified lymph node since this is new in past 2 months.  Unremarkable bladder, ureters, uterus and adnexae.  Appendix not visualized.  No definite rectal wall thickening.  No additional mass, adenopathy, free fluid, free air or abscess.  Bones demineralized with scattered degenerative changes.  Surgical scar at ventral abdomen with a small incisional hernia containing a portion of transverse colon.  IMPRESSION: Thickened pylorus and pre-pyloric antrum likely causing partial outlet obstruction.  Large filling defect within stomach consistent with recurrent bezoar 12.2 x 10.0 x 15.2 cm.  Colonic diverticulosis.  Suspected hepatic cysts unchanged.  13 mm high attenuation nodule adjacent to thickened gastroesophageal junction, question contained perforation versus epiphrenic diverticulum containing contrast, new since 01/17/2014 but unchanged since 03/07/2014.  Small incisional hernia containing a portion of the transverse colon wall without obstruction.   Electronically Signed   By: Ulyses Southward M.D.   On: 03/12/2014 23:12   Ct Head Wo Contrast  03/22/2014   CLINICAL DATA:  Acute onset of slurred speech.  Initial encounter.  EXAM: CT HEAD WITHOUT CONTRAST  TECHNIQUE: Contiguous axial images were obtained from the base of the skull through the vertex without intravenous contrast.  COMPARISON:  CT of the head performed 12/23/2013, and MRI of the brain performed 12/24/2013  FINDINGS: There is no evidence of acute infarction, mass lesion, or  intra- or extra-axial hemorrhage on CT.  Prominence of the ventricles and sulci reflects mild to moderate cortical volume loss. Cerebellar atrophy is noted. Scattered periventricular and subcortical white matter change likely reflects small vessel ischemic  microangiopathy. A chronic infarct is noted at the left cerebellar hemisphere.  The brainstem and fourth ventricle are within normal limits. The basal ganglia are unremarkable in appearance. The cerebral hemispheres demonstrate grossly normal gray-white differentiation. No mass effect or midline shift is seen.  There is no evidence of fracture; visualized osseous structures are unremarkable in appearance. The visualized portions of the orbits are within normal limits. The paranasal sinuses and mastoid air cells are well-aerated. No significant soft tissue abnormalities are seen.  IMPRESSION: 1. No acute intracranial pathology seen on CT. 2. Mild to moderate cortical volume loss and scattered small vessel ischemic microangiopathy. 3. 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   Electronically Signed   By: Roanna Raider M.D.   On: 03/22/2014 03:30   Mr Brain Wo Contrast  03/23/2014   CLINICAL DATA:  Acute onset of slurred speech. Previous history of CVA.  EXAM: MRI HEAD WITHOUT CONTRAST  TECHNIQUE: Multiplanar, multiecho pulse sequences of the brain and surrounding structures were obtained without intravenous contrast.  COMPARISON:  MR brain 12/24/2013.  CT head 03/22/2014.  FINDINGS: No evidence for acute infarction, hemorrhage, mass lesion, hydrocephalus, or extra-axial fluid. Generalized cerebral and cerebellar atrophy. Extensive T2 and FLAIR hyperintensities throughout the periventricular and subcortical white matter, consistent with small vessel disease. Flow voids are maintained throughout the carotid, basilar, and vertebral arteries. There are no areas of chronic hemorrhage. Pituitary, pineal, and cerebellar tonsils unremarkable. No upper  cervical lesions. Mild pannus, with mild cervical spondylosis. Chronic LEFT cerebellar infarct. Chronic BILATERAL thalamic infarcts. Tiny chronic microhemorrhages in the brain, better appreciated on the previous 3T scan at [facility]. No acute sinus or mastoid disease. Negative orbits. Scalp soft tissues unremarkable.  IMPRESSION: Chronic changes as described. No acute intracranial abnormality. Similar appearance to prior MR.   Electronically Signed   By: Davonna Belling M.D.   On: 03/23/2014 16:22   Ct Abdomen Pelvis W Contrast  03/07/2014   CLINICAL DATA:  80 year old female presenting with rectal pressure gradually worsening over the prior month.  EXAM: CT ABDOMEN AND PELVIS WITH CONTRAST  TECHNIQUE: Multidetector CT imaging of the abdomen and pelvis was performed using the standard protocol following bolus administration of intravenous contrast.  CONTRAST:  50mL OMNIPAQUE IOHEXOL 300 MG/ML SOLN, 80mL OMNIPAQUE IOHEXOL 300 MG/ML SOLN  COMPARISON:  CT of the abdomen and pelvis 01/17/2014.  FINDINGS: Lower chest: Cardiomegaly with biatrial dilatation. Calcifications of the aortic valve. Atherosclerotic calcifications in the left main and left circumflex coronary arteries. Small hiatal hernia.  Hepatobiliary: Multifocal low-attenuation lesions appear unchanged in size, number and distribution scattered throughout the liver. Many these are too small to definitively characterize, while the larger lesions are intermediate attenuation, favored to represent proteinaceous cysts. The largest of these lesions measures only 12 mm in segment 2 (image 21 of series 2). No new hepatic lesions are noted. No intra or extrahepatic biliary ductal dilatation. Status post cholecystectomy.  Pancreas: Unremarkable.  Spleen: Unremarkable.  Adrenals/Urinary Tract: Again noted is a 1.2 cm intermediate attenuation (23 HU) lesion in the lower pole of the left kidney, with some surrounding architectural distortion, favored to represent a  previously ruptured proteinaceous cyst. No suspicious renal lesions are noted. Multifocal cortical thinning in the kidneys bilaterally, presumably mild scarring. No hydroureteronephrosis. Urinary bladder is normal in appearance. Bilateral adrenal glands are normal in appearance.  Stomach/Bowel: As with the prior examination there is extensive thickening of the gastric wall in the antral pre-pyloric region of the stomach. The pylorus is  also markedly thickened (up to 15 mm in thickness). The stomach is moderately distended. No pathologic dilatation of small bowel or colon. Postoperative changes of partial colectomy near the rectosigmoid junction are noted. A few scattered colonic diverticulae are noted, without surrounding inflammatory changes to suggest an acute diverticulitis at this time. The distal rectum and anus are grossly unremarkable in appearance.  Vascular/Lymphatic: Extensive atherosclerosis throughout the abdominal and pelvic vasculature, without evidence of aneurysm or dissection. No lymphadenopathy noted in the abdomen or pelvis.  Reproductive: Retroverted uterus.  Ovaries are atrophic.  Other: No significant volume of ascites.  No pneumoperitoneum.  Musculoskeletal: Tiny ventral hernia, through which there is incomplete herniation of the mid transverse colon; no findings to suggest associated bowel incarceration or obstruction at this time. Old compression fracture of superior endplate of T11 with approximately 20% loss of anterior vertebral body height (unchanged). There are no aggressive appearing lytic or blastic lesions noted in the visualized portions of the skeleton.  IMPRESSION: 1. No findings to account for the patient's worsening rectal pressure. 2. There is persistent thickening of the pylorus and antral pre-pyloric region of the stomach, which could be inflammatory in the setting of ulcer disease, or could be neoplastic. There is persistent moderate dilatation of the stomach, suggesting  some degree of gastric outlet obstruction. Correlation with results of recent upper endoscopy is suggested. 3. Additional incidental findings, as above, similar to prior examination.   Electronically Signed   By: Trudie Reed M.D.   On: 03/07/2014 16:28   Dg Chest Port 1 View  03/14/2014   CLINICAL DATA:  80 year old female with new onset chest pain beginning today  EXAM: PORTABLE CHEST - 1 VIEW  COMPARISON:  None.  FINDINGS: Cardiomegaly. Atherosclerotic calcification present in the transverse aorta. No focal airspace consolidation, pulmonary edema, pneumothorax. a query trace left pleural effusion. No acute osseous abnormality. The left glenohumeral joint osteoarthritis. Mild coarsening of the interstitial markings.  IMPRESSION: 1. Query trace left pleural effusion. 2. Cardiomegaly. 3. Aortic atherosclerosis.   Electronically Signed   By: Malachy Moan M.D.   On: 03/14/2014 10:12   Dg Abd Portable 1v  03/15/2014   CLINICAL DATA:  Nasogastric tube placement.  EXAM: PORTABLE ABDOMEN - 1 VIEW  COMPARISON:  03/14/2014  FINDINGS: The nasogastric tube is in place, tip coiled within the stomach and tip overlying the region of the fundus. Surgical clips are noted in the right upper quadrant. There is residual contrast in the colon and distal small bowel following esophagram on 03/13/2014. Bowel gas pattern is nonobstructive.  IMPRESSION: Nasogastric tube coiled within the stomach.   Electronically Signed   By: Rosalie Gums M.D.   On: 03/15/2014 10:49   Dg Abd Portable 2v  03/14/2014   CLINICAL DATA:  80 year old female with persistent abdominal pain and fullness.  EXAM: PORTABLE ABDOMEN - 2 VIEW  COMPARISON:  Esophagram performed yesterday. CT abdomen/ pelvis 03/12/2014  FINDINGS: No evidence of free air on the lateral decubitus view. Contrast material is noted within the stomach and scattered throughout the large bowel. There is no evidence of obstruction. The volume of contrast material remaining  within the stomach is greater than expected. The bones are diffusely osteopenic. Surgical clips in the right upper quadrant suggest prior cholecystectomy.  IMPRESSION: 1. No evidence of obstruction or free air. 2. Greater than expected volume of retained oral contrast within the stomach. Query clinical history of delayed gastric emptying or gastroparesis? 3. The bones appear osteopenic.   Electronically Signed  By: Malachy Moan M.D.   On: 03/14/2014 10:04   Dg Esophagus W/water Sol Cm  03/13/2014   CLINICAL DATA:  Abnormal CT, evaluate for esophageal perforation  EXAM: ESOPHOGRAM/BARIUM SWALLOW  TECHNIQUE: Single contrast examination was performed using water-soluble contrast.  FLUOROSCOPY TIME:  1 min 7 seconds  COMPARISON:  CT abdomen pelvis dated 03/12/2014 and 03/07/2014  FINDINGS: Esophageal dysmotility.  No fixed esophageal narrowing/stricture.  Suspected small paraesophageal hernia (less likely hiatal hernia or esophageal diverticulum) at the GE junction. The appearance does not suggest a contained esophageal perforation.  Presence of gastroesophageal reflux could not be assessed due to the patient's inability to clear her esophagus.  Moderate food/debris in the stomach.  IMPRESSION: Esophageal dysmotility.  No fixed esophageal narrowing/stricture.  Suspected small paraesophageal hernia at the GE junction. No evidence of contained esophageal perforation.  Moderate food/debris in the stomach.   Electronically Signed   By: Charline Bills M.D.   On: 03/13/2014 09:44         Subjective: Patient denies fevers, chills, headache, chest pain, dyspnea, nausea, vomiting, diarrhea, abdominal pain, dysuria, hematuria   Objective: Filed Vitals:   03/23/14 1208 03/23/14 1300 03/23/14 1400 03/23/14 1500  BP: 176/60 156/80 142/47 151/44  Pulse:  69 69 72  Temp:      TempSrc:      Resp:  20 19 21   Height:      Weight:      SpO2:  99% 100% 100%    Intake/Output Summary (Last 24 hours) at  03/23/14 1632 Last data filed at 03/23/14 1600  Gross per 24 hour  Intake   1870 ml  Output   2075 ml  Net   -205 ml   Weight change: 0.4 kg (14.1 oz) Exam:   General:  Pt is alert, follows commands appropriately, not in acute distress  HEENT: No icterus, No thrush, [place]/AT  Cardiovascular: RRR, S1/S2, no rubs, no gallops  Respiratory: CTA bilaterally, no wheezing, no crackles, no rhonchi  Abdomen: Soft/+BS, non tender, non distended, no guarding  Extremities: No edema, No lymphangitis, No petechiae, No rashes, no synovitis  Data Reviewed: Basic Metabolic Panel:  Recent Labs Lab 03/17/14 0418 03/18/14 0615 03/19/14 0520 03/22/14 0339 03/23/14 0425  NA 125* 126* 132* 127* 125*  K 3.5* 4.3 4.0 4.4 4.6  CL 92* 93* 100 92* 92*  CO2 20 20 19 21 21   GLUCOSE 179* 116* 120* 128* 130*  BUN 9 7 6  4* 5*  CREATININE 0.97 0.96 0.94 0.95 0.94  CALCIUM 9.3 9.4 9.3 9.7 9.4  MG  --  1.5  --   --   --    Liver Function Tests: No results for input(s): AST, ALT, ALKPHOS, BILITOT, PROT, ALBUMIN in the last 168 hours. No results for input(s): LIPASE, AMYLASE in the last 168 hours. No results for input(s): AMMONIA in the last 168 hours. CBC:  Recent Labs Lab 03/18/14 0615 03/19/14 0520 03/21/14 0528 03/22/14 0339 03/23/14 0425  WBC 5.5 5.2 4.4 4.7 4.0  HGB 10.1* 9.7* 8.8* 9.9* 10.2*  HCT 29.0* 27.6* 25.0* 28.9* 29.0*  MCV 88.1 88.5 88.3 87.6 87.3  PLT 268 309 303 341 373   Cardiac Enzymes:  Recent Labs Lab 03/16/14 2100  TROPONINI <0.30   BNP: Invalid input(s): POCBNP CBG:  Recent Labs Lab 03/17/14 0032 03/17/14 0453 03/17/14 0743 03/17/14 1325 03/17/14 1612  GLUCAP 164* 157* 169* 121* 120*    Recent Results (from the past 240 hour(s))  MRSA PCR Screening  Status: None   Collection Time: 03/16/14 12:36 PM  Result Value Ref Range Status   MRSA by PCR NEGATIVE NEGATIVE Final    Comment:        The GeneXpert MRSA Assay (FDA approved for NASAL  specimens only), is one component of a comprehensive MRSA colonization surveillance program. It is not intended to diagnose MRSA infection nor to guide or monitor treatment for MRSA infections.   Urine culture     Status: None   Collection Time: 03/22/14 10:00 AM  Result Value Ref Range Status   Specimen Description URINE, CATHETERIZED  Final   Special Requests NONE  Final   Culture  Setup Time   Final    03/22/2014 16:51 Performed at [facility]    Colony Count NO GROWTH Performed at [facility]   Final   Culture NO GROWTH Performed at [facility]   Final   Report Status 03/23/2014 FINAL  Final     Scheduled Meds: . amLODipine  10 mg Oral Daily  . antiseptic oral rinse  7 mL Mouth Rinse q12n4p  . aspirin  300 mg Rectal Daily  . chlorhexidine  15 mL Mouth Rinse BID  . cloNIDine  0.3 mg Transdermal Weekly  . cloNIDine  0.2 mg Oral TID  . cycloSPORINE  1 drop Both Eyes BID  . hydrALAZINE  10 mg Intravenous 4 times per day  . labetalol  20 mg Intravenous Q6H  . lactose free nutrition  237 mL Oral BID BM  . latanoprost  1 drop Right Eye QHS  . levothyroxine  100 mcg Intravenous Daily  . metoCLOPramide (REGLAN) injection  10 mg Intravenous 3 times per day  . nitroGLYCERIN  0.2 mg Transdermal Daily  . pantoprazole (PROTONIX) IV  40 mg Intravenous Q12H   Continuous Infusions: . nitroGLYCERIN Stopped (03/22/14 1600)  . sodium chloride 0.9 % 1,000 mL with potassium chloride 20 mEq infusion 100 mL/hr at 03/23/14 0942     Daisy Lites, DO  [facility] Pager [phone]  If 7PM-7AM, please contact night-coverage [email or web address] Password [email or web address] 03/23/2014, 4:32 PM   LOS: 11 days

## 2014-03-23 NOTE — Progress Notes (Signed)
4 Days Post-Op  Subjective: Remains stable. Tolerating liquid diet without nausea or vomiting or pain. Upper GI still not done  Objective: Vital signs in last 24 hours: Temp:  [97.6 F (36.4 C)-99 F (37.2 C)] 98.4 F (36.9 C) (12/21 0800) Pulse Rate:  [55-73] 60 (12/21 1100) Resp:  [5-20] 16 (12/21 1100) BP: (130-198)/(37-91) 139/37 mmHg (12/21 1100) SpO2:  [99 %-100 %] 99 % (12/21 1100) Weight:  [141 lb 15.6 oz (64.4 kg)] 141 lb 15.6 oz (64.4 kg) (12/21 0433) Last BM Date: 03/21/14  Intake/Output from previous day: 12/20 0701 - 12/21 0700 In: 1763 [P.O.:360; I.V.:1403] Out: 1529 [Urine:1529] Intake/Output this shift: Total I/O In: 395 [P.O.:120; I.V.:275] Out: 350 [Urine:350]  General appearance: Alert. Deconditioned. Somewhat feeble. No distress. Aware that she is at [facility], states that she just finished her breakfast, and understands that she has stomach problems. GI: Abdomen soft. Nontender.  Lab Results:   Recent Labs  03/22/14 0339 03/23/14 0425  WBC 4.7 4.0  HGB 9.9* 10.2*  HCT 28.9* 29.0*  PLT 341 373   BMET  Recent Labs  03/22/14 0339 03/23/14 0425  NA 127* 125*  K 4.4 4.6  CL 92* 92*  CO2 21 21  GLUCOSE 128* 130*  BUN 4* 5*  CREATININE 0.95 0.94  CALCIUM 9.7 9.4   PT/INR No results for input(s): LABPROT, INR in the last 72 hours. ABG No results for input(s): PHART, HCO3 in the last 72 hours.  Invalid input(s): PCO2, PO2  Studies/Results: Ct Head Wo Contrast  03/22/2014   CLINICAL DATA:  Acute onset of slurred speech.  Initial encounter.  EXAM: CT HEAD WITHOUT CONTRAST  TECHNIQUE: Contiguous axial images were obtained from the base of the skull through the vertex without intravenous contrast.  COMPARISON:  CT of the head performed 12/23/2013, and MRI of the brain performed 12/24/2013  FINDINGS: There is no evidence of acute infarction, mass lesion, or intra- or extra-axial hemorrhage on CT.  Prominence of the ventricles and sulci  reflects mild to moderate cortical volume loss. Cerebellar atrophy is noted. Scattered periventricular and subcortical white matter change likely reflects small vessel ischemic microangiopathy. A chronic infarct is noted at the left cerebellar hemisphere.  The brainstem and fourth ventricle are within normal limits. The basal ganglia are unremarkable in appearance. The cerebral hemispheres demonstrate grossly normal gray-white differentiation. No mass effect or midline shift is seen.  There is no evidence of fracture; visualized osseous structures are unremarkable in appearance. The visualized portions of the orbits are within normal limits. The paranasal sinuses and mastoid air cells are well-aerated. No significant soft tissue abnormalities are seen.  IMPRESSION: 1. No acute intracranial pathology seen on CT. 2. Mild to moderate cortical volume loss and scattered small vessel ischemic microangiopathy. 3. 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   Electronically Signed   By: Garald Balding M.D.   On: 03/22/2014 03:30    Anti-infectives: Anti-infectives    None      Assessment/Plan: s/p Procedure(s): ESOPHAGOGASTRODUODENOSCOPY (EGD)   Incomplete partial gastric outlet obstruction with tortuosity of distal stomach seen on EGD 12/17. Recent bezoar Tolerating liquid diet  Past history gastrojejunostomy  Patient family do not want any further surgery. As such we will be available if needed. We will review upper GI, once it is done. Please contact us if surgical problems arise.   LOS: 11 days    Lamontae Ricardo M 03/23/2014

## 2014-03-23 NOTE — Progress Notes (Signed)
[facility] Progress Note  Subjective:  Tolerating full liquids so far.  No nausea, vomiting, abdominal pain at this point.  UGI/SBFT ordered but still pending.  Objective:  Vital signs in last 24 hours: Temp:  [97.6 F (36.4 C)-99 F (37.2 C)] 98.4 F (36.9 C) (12/21 0800) Pulse Rate:  [55-75] 66 (12/21 0800) Resp:  [5-20] 20 (12/21 0800) BP: (130-198)/(39-91) 186/63 mmHg (12/21 0922) SpO2:  [99 %-100 %] 100 % (12/21 0800) Weight:  [141 lb 15.6 oz (64.4 kg)] 141 lb 15.6 oz (64.4 kg) (12/21 0433) Last BM Date: 03/21/14 General:  Alert, Well-developed, in NAD Heart:  Regular rate and rhythm; no murmurs Pulm:  CTAB.  No W/R/R. Abdomen:  Soft, non-distended. Normal bowel sounds.  Non-tender. Extremities:  Without edema. Neurologic:  Alert and  oriented x4;  grossly normal neurologically. Psych:  Alert and cooperative. Normal mood and affect.  Intake/Output from previous day: 12/20 0701 - 12/21 0700 In: 1763 [P.O.:360; I.V.:1403] Out: 1529 [Urine:1529] Intake/Output this shift: Total I/O In: 195 [P.O.:120; I.V.:75] Out: 350 [Urine:350]  Lab Results:  Recent Labs  03/21/14 0528 03/22/14 0339 03/23/14 0425  WBC 4.4 4.7 4.0  HGB 8.8* 9.9* 10.2*  HCT 25.0* 28.9* 29.0*  PLT 303 341 373   BMET  Recent Labs  03/22/14 0339 03/23/14 0425  NA 127* 125*  K 4.4 4.6  CL 92* 92*  CO2 21 21  GLUCOSE 128* 130*  BUN 4* 5*  CREATININE 0.95 0.94  CALCIUM 9.7 9.4   Ct Head Wo Contrast  03/22/2014   CLINICAL DATA:  Acute onset of slurred speech.  Initial encounter.  EXAM: CT HEAD WITHOUT CONTRAST  TECHNIQUE: Contiguous axial images were obtained from the base of the skull through the vertex without intravenous contrast.  COMPARISON:  CT of the head performed 12/23/2013, and MRI of the brain performed 12/24/2013  FINDINGS: There is no evidence of acute infarction, mass lesion, or intra- or extra-axial hemorrhage on CT.  Prominence of the ventricles and sulci  reflects mild to moderate cortical volume loss. Cerebellar atrophy is noted. Scattered periventricular and subcortical white matter change likely reflects small vessel ischemic microangiopathy. A chronic infarct is noted at the left cerebellar hemisphere.  The brainstem and fourth ventricle are within normal limits. The basal ganglia are unremarkable in appearance. The cerebral hemispheres demonstrate grossly normal gray-white differentiation. No mass effect or midline shift is seen.  There is no evidence of fracture; visualized osseous structures are unremarkable in appearance. The visualized portions of the orbits are within normal limits. The paranasal sinuses and mastoid air cells are well-aerated. No significant soft tissue abnormalities are seen.  IMPRESSION: 1. No acute intracranial pathology seen on CT. 2. Mild to moderate cortical volume loss and scattered small vessel ischemic microangiopathy. 3. 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   Electronically Signed   By: Garald Balding M.D.   On: 03/22/2014 03:30    Assessment / Plan: -80 y.o. female with incomplete, partial gastric outlet obstruction with tortuous distal stomach seen on EGD 12/17.  She has incomplete gastric outlet obstruction from distally tortuous, anatomically abnormal distal stomach. This was the same problem she had many years ago that led to her original gastro-jejunostomy. Recent bezoar, difficultly with gastric secretions, however, UGI showed contrast does trickle through into duodenum. Difficult situation. Pt and family do not want any further surgeries. Agree with repeat UGI, SBFT to define anatomy best (has been ordered but not yet performed).  She was  started on reglan 10 mg IV TID, perhaps this will relieve some of the problem. Would not plan to advance her diet past full liquids. If she proves she cannot tolerate full liquids with reglan, then would consider venting PEG, perhaps PEG-J to allow nutritional  support.  -Malignant HTN/hypertensive urgency -Hyponatremia -Recent slurred speech:  Seen by neuro and CT ok.  MRI head ordered.  So far no sign of CVA or other acute issues.    LOS: 11 days   ZEHR, JESSICA D.  03/23/2014, 9:26 AM  Pager number [phone]  GI Attending Note  I have personally taken an interval history, reviewed the chart, and examined the patient.  He claims that she is having less nausea.  The immediate goal would be to maintain her nutrition, even with oral supplements only.  Failing that, may consider a percutaneous gastrojejunostomy tube or perhaps even a duodenal stent. Await upper GI series.  Sandy Salaam. Deatra Ina, MD, [facility] [phone]

## 2014-03-24 ENCOUNTER — Inpatient Hospital Stay (HOSPITAL_COMMUNITY): Payer: Medicare Other

## 2014-03-24 LAB — CBC
HCT: 29.4 % — ABNORMAL LOW (ref 36.0–46.0)
Hemoglobin: 10.2 g/dL — ABNORMAL LOW (ref 12.0–15.0)
MCH: 30.3 pg (ref 26.0–34.0)
MCHC: 34.7 g/dL (ref 30.0–36.0)
MCV: 87.2 fL (ref 78.0–100.0)
Platelets: 321 10*3/uL (ref 150–400)
RBC: 3.37 MIL/uL — ABNORMAL LOW (ref 3.87–5.11)
RDW: 15.6 % — ABNORMAL HIGH (ref 11.5–15.5)
WBC: 5.9 10*3/uL (ref 4.0–10.5)

## 2014-03-24 LAB — BASIC METABOLIC PANEL
Anion gap: 8 (ref 5–15)
BUN: 6 mg/dL (ref 6–23)
CO2: 23 mmol/L (ref 19–32)
Calcium: 9.1 mg/dL (ref 8.4–10.5)
Chloride: 95 mEq/L — ABNORMAL LOW (ref 96–112)
Creatinine, Ser: 0.85 mg/dL (ref 0.50–1.10)
GFR calc Af Amer: 69 mL/min — ABNORMAL LOW (ref >90)
GFR calc non Af Amer: 59 mL/min — ABNORMAL LOW (ref >90)
Glucose, Bld: 135 mg/dL — ABNORMAL HIGH (ref 70–99)
Potassium: 4.4 mmol/L (ref 3.5–5.1)
Sodium: 126 mmol/L — ABNORMAL LOW (ref 135–145)

## 2014-03-24 MED ORDER — POLYETHYLENE GLYCOL 3350 17 G PO PACK
17.0000 g | PACK | Freq: Every day | ORAL | Status: DC | PRN
  Administered 2014-03-26: 17 g via ORAL
  Filled 2014-03-24 (×2): qty 1

## 2014-03-24 NOTE — Progress Notes (Signed)
PROGRESS NOTE  Shannon [name] VWU:[id number] DOB: [birth year] DOA: 03/12/2014 PCP: Oneal Grout, MD  Brief history 80 y.o. female with remote gastro-jejunostomy for peptic ulcer disease. She unfortunately had a perforation of an anastomotic ulcer at the gastro-jejunal anastomosis in June 2015 and underwent surgery at [facility].Patient was hospitalized here in October 2015 with abdominal pain nausea and vomiting and had endoscopy done twice during that admission with finding of a gastric bezoar.She was discharged home on a pured diet and PPI therapy.She unfortunately present on 12/10 with abd pain/vomiting, CT scan of abdomen showed persistent marked thickening of the pyloric channel and prepyloric antrum and a large filling defect in the stomach measuring 12 x 10 x 15 cm consistent with a bezoar.She was admitted for further evaluation and treatment. GI and Gen Surgery have been consulted. Course complicated with hypertensive urgency and patient is requiring NTG drip. On 03/22/2014, the patient became encephalopathic and had a transient episode of slurred speech. She had no other focal deficits. Neurology was consulted and recommended MRI of the brain.  MRI brain negative for acute stroke.   As the patient's family does not want any further surgical intervention, general surgery has signed off.  The pt's anti-HTN regimen was adjusted and she was weaned off of the NTG drip x 24 hrs and transferred to the medical floor on 03/24/14.  Assessment/Plan: Gastric Outlet Obstruction: -Initially some question of perforation at GE junction on CT Scan, but this was ruled out on Barium Esophagogram.  -continues to feel bloated/belching-having chest pain-suspect from upward pressure from outlet obstruction- troponin neg X5 -NGT for decompression--removed 03/17/14-- no nausea or vomiting. (But reports some discomfort in her epigastric/LUQ area) -03/19/14--EGD demonstrated incomplete gastric outlet  obstruction from distal tortuous/anatomically abnormal distal stomach from surgery (gastrojejunostomy).  -surgery consulted and will follow rec's (Family do not want any more surgeries) -General surgery has signed off as the patient's family does not want any further surgery -plans for SBFT noted  -Appreciate GI follow-up -May need to discuss GJ tube or stents pending SBFT on 03/25/14 HTN Urgency  -Patient was transferred to SDU on 03/16/14 and started on NTG drip -BP still elevated and requiring NTG drip -continue schedule hydralazine, continue labetalol 20mg  Q6H and will also add clonidine (solution TID; as patient is able to tolerate clear liquid) -03/21/14 add amlodipine suspension  -03/22/14 start clonidine TTS-3 patch-->wean oral clonidine over next 2-3 days as it will take 48-72hrs for patch to take full effect -03/22/14--start nitro patch -will titrate off NTG drip as tolerated--titratefor SBP <170 and DBP<100 -BP will be easier to control once able to take PO meds. -check renin/aldosterone ratio--pending -03/24/14--off NTG drip x 36 hours-->transfer to floor -convert to oral ant-HTN when pt is reliably able to take po--for now, keep most meds IV or TD Acute encephalopathy -03/22/2014 patient is more somnolent -03/23/14--back to baseline -Likely multifactorial which may include hyponatremia, hypertensive encephalopathy, PRES, UTI, or new cerebral ischemia -EKG -MRI brain without contrast--no acute changes -check TSH--27.660-->increase synthroid dose to IV daily -serum B12-->2000 -RBC folate-->1000 -UA --no pyuria -start NS at 75cc/hr -d/c all opioids and hypnotics  Remote hx of Gastrojejunostomy with perforation of anastomotic ulcer in June 2015-s/p revision/laparotomy -general surgery consulted--plans noted for SBFT -continue full liquid diet for now and use of reglan  -Patient is passing flatus and having small bowel movements Hyponatremia -multifactorial  including poor solute intake, volume depletion and a component of SIADH according  to serum and urine osm -pt has had neg fluid balance past 48 hours-->continue NS Atypical Chest pain  -Troponins negative 3  -resolved -EKG with nonspecific changes -most likely secondary to GERD and hypertensive urgency -continue PPI and BP control   Diabetes mellitus type 2 -12/24/2013 hemoglobin A1c 6.9 -Given the patient's age and current recommendations, will allow for Liberal gylcemic -will monitor for now -low carb diet when able to tolerate PO's  hyponatremia  -Secondary to hypotonic fluid given on admission previously -urine and serum osm--suggest a degree of SIADH -restart NS--increase rate to 100cc/hr  CKD stage 3 -baseline creatinine 0.9-1.3 -appears to remained stable/at baseline currently -will monitor  hypokalemia -repletion/maintenance on IVF  -MG level WNL  Hypothyroidism: -continue with IV Levothyroxine for now--increase dose to IV daily -TSH 27.660  Hx CVA ASA Suppositories until able to fully take po No new deficits appreciated   Depression:  -since NPO-all meds on hold-resume when able to fully tolerate by mouth meds    Family Communication: daughter updated at bedside, total time 40 min Disposition Plan: Home when medically stable      Procedures/Studies: Ct Abdomen Pelvis Wo Contrast  03/12/2014   CLINICAL DATA:  Constant vomiting today, constipated since Saturday, RIGHT lower quadrant rectal pain, history diabetes, hypertension, prior gastric bezoar  EXAM: CT ABDOMEN AND PELVIS WITHOUT CONTRAST  TECHNIQUE: Multidetector CT imaging of the abdomen and pelvis was performed following the standard protocol without IV contrast. Sagittal and coronal MPR images reconstructed from axial data set.  COMPARISON:  03/07/2014  FINDINGS: Minimal bibasilar atelectasis.  Low-attenuation foci within liver, question cysts, unchanged.  Metallic foreign body at  LEFT ventricle again identified, uncertain etiology.  Gallbladder surgically absent.  Probable tiny LEFT renal cyst 9 mm diameter.  Within limits of a nonenhanced exam, remainder of liver, spleen, pancreas, kidneys and adrenal glands normal appearance.  Persistent marked thickening of the pyloric channel and pre pyloric antrum gastric distention question partial gastric outlet obstruction.  Small amount of ingested contrast is present within proximal small bowel loops.  Large filling defect within the stomach, 12.2 x 10.0 x 15.2 cm, containing bubbly gas, consistent with recurrent bezoar.  Scattered colonic diverticula.  No evidence of bowel obstruction or dilatation.  Extensive atherosclerotic calcifications aorta and minimally in coronary arteries.  High attenuation nodule 13 mm diameter again identified adjacent to the thickened gastroesophageal junction, unchanged but since 03/07/2014 but new since 01/17/2014, question contrast within epiphrenic diverticulum or small focal contained perforation, unlikely calcified lymph node since this is new in past 2 months.  Unremarkable bladder, ureters, uterus and adnexae.  Appendix not visualized.  No definite rectal wall thickening.  No additional mass, adenopathy, free fluid, free air or abscess.  Bones demineralized with scattered degenerative changes.  Surgical scar at ventral abdomen with a small incisional hernia containing a portion of transverse colon.  IMPRESSION: Thickened pylorus and pre-pyloric antrum likely causing partial outlet obstruction.  Large filling defect within stomach consistent with recurrent bezoar 12.2 x 10.0 x 15.2 cm.  Colonic diverticulosis.  Suspected hepatic cysts unchanged.  13 mm high attenuation nodule adjacent to thickened gastroesophageal junction, question contained perforation versus epiphrenic diverticulum containing contrast, new since 01/17/2014 but unchanged since 03/07/2014.  Small incisional hernia containing a portion of the  transverse colon wall without obstruction.   Electronically Signed   By: Ulyses Southward M.D.   On: 03/12/2014 23:12   Ct Head Wo Contrast  03/22/2014   CLINICAL DATA:  Acute onset  of slurred speech.  Initial encounter.  EXAM: CT HEAD WITHOUT CONTRAST  TECHNIQUE: Contiguous axial images were obtained from the base of the skull through the vertex without intravenous contrast.  COMPARISON:  CT of the head performed 12/23/2013, and MRI of the brain performed 12/24/2013  FINDINGS: There is no evidence of acute infarction, mass lesion, or intra- or extra-axial hemorrhage on CT.  Prominence of the ventricles and sulci reflects mild to moderate cortical volume loss. Cerebellar atrophy is noted. Scattered periventricular and subcortical white matter change likely reflects small vessel ischemic microangiopathy. A chronic infarct is noted at the left cerebellar hemisphere.  The brainstem and fourth ventricle are within normal limits. The basal ganglia are unremarkable in appearance. The cerebral hemispheres demonstrate grossly normal gray-white differentiation. No mass effect or midline shift is seen.  There is no evidence of fracture; visualized osseous structures are unremarkable in appearance. The visualized portions of the orbits are within normal limits. The paranasal sinuses and mastoid air cells are well-aerated. No significant soft tissue abnormalities are seen.  IMPRESSION: 1. No acute intracranial pathology seen on CT. 2. Mild to moderate cortical volume loss and scattered small vessel ischemic microangiopathy. 3. 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   Electronically Signed   By: Roanna Raider M.D.   On: 03/22/2014 03:30   Mr Brain Wo Contrast  03/23/2014   CLINICAL DATA:  Acute onset of slurred speech. Previous history of CVA.  EXAM: MRI HEAD WITHOUT CONTRAST  TECHNIQUE: Multiplanar, multiecho pulse sequences of the brain and surrounding structures were obtained without intravenous contrast.   COMPARISON:  MR brain 12/24/2013.  CT head 03/22/2014.  FINDINGS: No evidence for acute infarction, hemorrhage, mass lesion, hydrocephalus, or extra-axial fluid. Generalized cerebral and cerebellar atrophy. Extensive T2 and FLAIR hyperintensities throughout the periventricular and subcortical white matter, consistent with small vessel disease. Flow voids are maintained throughout the carotid, basilar, and vertebral arteries. There are no areas of chronic hemorrhage. Pituitary, pineal, and cerebellar tonsils unremarkable. No upper cervical lesions. Mild pannus, with mild cervical spondylosis. Chronic LEFT cerebellar infarct. Chronic BILATERAL thalamic infarcts. Tiny chronic microhemorrhages in the brain, better appreciated on the previous 3T scan at [facility]. No acute sinus or mastoid disease. Negative orbits. Scalp soft tissues unremarkable.  IMPRESSION: Chronic changes as described. No acute intracranial abnormality. Similar appearance to prior MR.   Electronically Signed   By: Davonna Belling M.D.   On: 03/23/2014 16:22   Ct Abdomen Pelvis W Contrast  03/07/2014   CLINICAL DATA:  80 year old female presenting with rectal pressure gradually worsening over the prior month.  EXAM: CT ABDOMEN AND PELVIS WITH CONTRAST  TECHNIQUE: Multidetector CT imaging of the abdomen and pelvis was performed using the standard protocol following bolus administration of intravenous contrast.  CONTRAST:  50mL OMNIPAQUE IOHEXOL 300 MG/ML SOLN, 80mL OMNIPAQUE IOHEXOL 300 MG/ML SOLN  COMPARISON:  CT of the abdomen and pelvis 01/17/2014.  FINDINGS: Lower chest: Cardiomegaly with biatrial dilatation. Calcifications of the aortic valve. Atherosclerotic calcifications in the left main and left circumflex coronary arteries. Small hiatal hernia.  Hepatobiliary: Multifocal low-attenuation lesions appear unchanged in size, number and distribution scattered throughout the liver. Many these are too small to definitively characterize, while the  larger lesions are intermediate attenuation, favored to represent proteinaceous cysts. The largest of these lesions measures only 12 mm in segment 2 (image 21 of series 2). No new hepatic lesions are noted. No intra or extrahepatic biliary ductal dilatation. Status post cholecystectomy.  Pancreas: Unremarkable.  Spleen: Unremarkable.  Adrenals/Urinary Tract: Again noted is a 1.2 cm intermediate attenuation (23 HU) lesion in the lower pole of the left kidney, with some surrounding architectural distortion, favored to represent a previously ruptured proteinaceous cyst. No suspicious renal lesions are noted. Multifocal cortical thinning in the kidneys bilaterally, presumably mild scarring. No hydroureteronephrosis. Urinary bladder is normal in appearance. Bilateral adrenal glands are normal in appearance.  Stomach/Bowel: As with the prior examination there is extensive thickening of the gastric wall in the antral pre-pyloric region of the stomach. The pylorus is also markedly thickened (up to 15 mm in thickness). The stomach is moderately distended. No pathologic dilatation of small bowel or colon. Postoperative changes of partial colectomy near the rectosigmoid junction are noted. A few scattered colonic diverticulae are noted, without surrounding inflammatory changes to suggest an acute diverticulitis at this time. The distal rectum and anus are grossly unremarkable in appearance.  Vascular/Lymphatic: Extensive atherosclerosis throughout the abdominal and pelvic vasculature, without evidence of aneurysm or dissection. No lymphadenopathy noted in the abdomen or pelvis.  Reproductive: Retroverted uterus.  Ovaries are atrophic.  Other: No significant volume of ascites.  No pneumoperitoneum.  Musculoskeletal: Tiny ventral hernia, through which there is incomplete herniation of the mid transverse colon; no findings to suggest associated bowel incarceration or obstruction at this time. Old compression fracture of superior  endplate of T11 with approximately 20% loss of anterior vertebral body height (unchanged). There are no aggressive appearing lytic or blastic lesions noted in the visualized portions of the skeleton.  IMPRESSION: 1. No findings to account for the patient's worsening rectal pressure. 2. There is persistent thickening of the pylorus and antral pre-pyloric region of the stomach, which could be inflammatory in the setting of ulcer disease, or could be neoplastic. There is persistent moderate dilatation of the stomach, suggesting some degree of gastric outlet obstruction. Correlation with results of recent upper endoscopy is suggested. 3. Additional incidental findings, as above, similar to prior examination.   Electronically Signed   By: Trudie Reed M.D.   On: 03/07/2014 16:28   Dg Chest Port 1 View  03/14/2014   CLINICAL DATA:  80 year old female with new onset chest pain beginning today  EXAM: PORTABLE CHEST - 1 VIEW  COMPARISON:  None.  FINDINGS: Cardiomegaly. Atherosclerotic calcification present in the transverse aorta. No focal airspace consolidation, pulmonary edema, pneumothorax. a query trace left pleural effusion. No acute osseous abnormality. The left glenohumeral joint osteoarthritis. Mild coarsening of the interstitial markings.  IMPRESSION: 1. Query trace left pleural effusion. 2. Cardiomegaly. 3. Aortic atherosclerosis.   Electronically Signed   By: Malachy Moan M.D.   On: 03/14/2014 10:12   Dg Abd Portable 1v  03/15/2014   CLINICAL DATA:  Nasogastric tube placement.  EXAM: PORTABLE ABDOMEN - 1 VIEW  COMPARISON:  03/14/2014  FINDINGS: The nasogastric tube is in place, tip coiled within the stomach and tip overlying the region of the fundus. Surgical clips are noted in the right upper quadrant. There is residual contrast in the colon and distal small bowel following esophagram on 03/13/2014. Bowel gas pattern is nonobstructive.  IMPRESSION: Nasogastric tube coiled within the stomach.    Electronically Signed   By: Rosalie Gums M.D.   On: 03/15/2014 10:49   Dg Abd Portable 2v  03/14/2014   CLINICAL DATA:  80 year old female with persistent abdominal pain and fullness.  EXAM: PORTABLE ABDOMEN - 2 VIEW  COMPARISON:  Esophagram performed yesterday. CT abdomen/ pelvis 03/12/2014  FINDINGS: No evidence of  free air on the lateral decubitus view. Contrast material is noted within the stomach and scattered throughout the large bowel. There is no evidence of obstruction. The volume of contrast material remaining within the stomach is greater than expected. The bones are diffusely osteopenic. Surgical clips in the right upper quadrant suggest prior cholecystectomy.  IMPRESSION: 1. No evidence of obstruction or free air. 2. Greater than expected volume of retained oral contrast within the stomach. Query clinical history of delayed gastric emptying or gastroparesis? 3. The bones appear osteopenic.   Electronically Signed   By: Malachy Moan M.D.   On: 03/14/2014 10:04   Dg Esophagus W/water Sol Cm  03/13/2014   CLINICAL DATA:  Abnormal CT, evaluate for esophageal perforation  EXAM: ESOPHOGRAM/BARIUM SWALLOW  TECHNIQUE: Single contrast examination was performed using water-soluble contrast.  FLUOROSCOPY TIME:  1 min 7 seconds  COMPARISON:  CT abdomen pelvis dated 03/12/2014 and 03/07/2014  FINDINGS: Esophageal dysmotility.  No fixed esophageal narrowing/stricture.  Suspected small paraesophageal hernia (less likely hiatal hernia or esophageal diverticulum) at the GE junction. The appearance does not suggest a contained esophageal perforation.  Presence of gastroesophageal reflux could not be assessed due to the patient's inability to clear her esophagus.  Moderate food/debris in the stomach.  IMPRESSION: Esophageal dysmotility.  No fixed esophageal narrowing/stricture.  Suspected small paraesophageal hernia at the GE junction. No evidence of contained esophageal perforation.  Moderate food/debris in  the stomach.   Electronically Signed   By: Charline Bills M.D.   On: 03/13/2014 09:44         Subjective: Patient denies fevers, chills, headache, chest pain, dyspnea, nausea, vomiting, diarrhea, abdominal pain, dysuria, hematuria   Objective: Filed Vitals:   03/24/14 1400 03/24/14 1420 03/24/14 1500 03/24/14 1700  BP: 201/81 201/81 199/75 158/59  Pulse: 70  72 65  Temp:      TempSrc:      Resp: 23  21 16   Height:      Weight:      SpO2: 99%  99% 99%    Intake/Output Summary (Last 24 hours) at 03/24/14 1714 Last data filed at 03/24/14 1700  Gross per 24 hour  Intake   2400 ml  Output   4025 ml  Net  -1625 ml   Weight change:  Exam:   General:  Pt is alert, follows commands appropriately, not in acute distress  HEENT: No icterus, No thrush, /AT  Cardiovascular: RRR, S1/S2, no rubs, no gallops  Respiratory: CTA bilaterally, no wheezing, no crackles, no rhonchi  Abdomen: Soft/+BS, non tender, non distended, no guarding  Extremities: No edema, No lymphangitis, No petechiae, No rashes, no synovitis  Data Reviewed: Basic Metabolic Panel:  Recent Labs Lab 03/18/14 0615 03/19/14 0520 03/22/14 0339 03/23/14 0425 03/24/14 0650  NA 126* 132* 127* 125* 126*  K 4.3 4.0 4.4 4.6 4.4  CL 93* 100 92* 92* 95*  CO2 20 19 21 21 23   GLUCOSE 116* 120* 128* 130* 135*  BUN 7 6 4* 5* 6  CREATININE 0.96 0.94 0.95 0.94 0.85  CALCIUM 9.4 9.3 9.7 9.4 9.1  MG 1.5  --   --   --   --    Liver Function Tests: No results for input(s): AST, ALT, ALKPHOS, BILITOT, PROT, ALBUMIN in the last 168 hours. No results for input(s): LIPASE, AMYLASE in the last 168 hours. No results for input(s): AMMONIA in the last 168 hours. CBC:  Recent Labs Lab 03/19/14 0520 03/21/14 0528 03/22/14 0339 03/23/14 0425  03/24/14 0650  WBC 5.2 4.4 4.7 4.0 5.9  HGB 9.7* 8.8* 9.9* 10.2* 10.2*  HCT 27.6* 25.0* 28.9* 29.0* 29.4*  MCV 88.5 88.3 87.6 87.3 87.2  PLT 309 303 341 373 321    Cardiac Enzymes: No results for input(s): CKTOTAL, CKMB, CKMBINDEX, TROPONINI in the last 168 hours. BNP: Invalid input(s): POCBNP CBG: No results for input(s): GLUCAP in the last 168 hours.  Recent Results (from the past 240 hour(s))  MRSA PCR Screening     Status: None   Collection Time: 03/16/14 12:36 PM  Result Value Ref Range Status   MRSA by PCR NEGATIVE NEGATIVE Final    Comment:        The GeneXpert MRSA Assay (FDA approved for NASAL specimens only), is one component of a comprehensive MRSA colonization surveillance program. It is not intended to diagnose MRSA infection nor to guide or monitor treatment for MRSA infections.   Urine culture     Status: None   Collection Time: 03/22/14 10:00 AM  Result Value Ref Range Status   Specimen Description URINE, CATHETERIZED  Final   Special Requests NONE  Final   Culture  Setup Time   Final    03/22/2014 16:51 Performed at [facility]    Colony Count NO GROWTH Performed at [facility]   Final   Culture NO GROWTH Performed at [facility]   Final   Report Status 03/23/2014 FINAL  Final     Scheduled Meds: . amLODipine  10 mg Oral Daily  . antiseptic oral rinse  7 mL Mouth Rinse q12n4p  . aspirin  300 mg Rectal Daily  . chlorhexidine  15 mL Mouth Rinse BID  . cloNIDine  0.3 mg Transdermal Weekly  . cloNIDine  0.2 mg Oral TID  . cycloSPORINE  1 drop Both Eyes BID  . hydrALAZINE  10 mg Intravenous 4 times per day  . labetalol  20 mg Intravenous Q6H  . lactose free nutrition  237 mL Oral BID BM  . latanoprost  1 drop Right Eye QHS  . levothyroxine  100 mcg Intravenous Daily  . metoCLOPramide (REGLAN) injection  10 mg Intravenous 3 times per day  . nitroGLYCERIN  0.2 mg Transdermal Daily  . pantoprazole (PROTONIX) IV  40 mg Intravenous Q12H   Continuous Infusions: . nitroGLYCERIN Stopped (03/22/14 1600)  . sodium chloride 0.9 % 1,000 mL with potassium chloride 20 mEq infusion 100  mL/hr at 03/24/14 0600     Katora Fini, DO  [facility] Pager [phone]  If 7PM-7AM, please contact night-coverage [email or web address] Password [email or web address] 03/24/2014, 5:14 PM   LOS: 12 days

## 2014-03-24 NOTE — Progress Notes (Signed)
OT Cancellation Note  Patient Details Name: Shannon [name] MRN: [id number] DOB: 19-Dec-1923   Cancelled Treatment:    Reason Eval/Treat Not Completed: Patient at procedure or test/ unavailable  Kodie Pick 03/24/2014, 3:40 PM  Lesle Chris, OTR/L [id number] 03/24/2014

## 2014-03-24 NOTE — Care Management Note (Addendum)
    Page 1 of 2   03/25/2014     8:07:54 AM CARE MANAGEMENT NOTE 03/25/2014  Patient:  Shannon [name], Shannon [name]   Account Number:  [id number]  Date Initiated:  03/13/2014  Documentation initiated by:  Sunday Spillers  Subjective/Objective Assessment:   80 yo female admitted with GOO. PTA lived at home with daughter.     Action/Plan:   Home when stable   Anticipated DC Date:  03/25/2014   Anticipated DC Plan:  [place]  CM consult      Choice offered to / List presented to:             [place]   Status of service:  In process, will continue to follow Medicare Important Message given?  YES (If response is "NO", the following Medicare IM given date fields will be blank) Date Medicare IM given:  03/16/2014 Medicare IM given by:  [facility] Date Additional Medicare IM given:   Additional Medicare IM given by:    Discharge Disposition:    Per UR Regulation:  Reviewed for med. necessity/level of care/duration of stay  If discussed at [place] of Stay Meetings, dates discussed:   03/20/2014  03/24/2014    Comments:  03/24/14 13:50 Cm met wiht pt, daughter, and grandaughter in [place] to discuss disposition.  LTAC was discussed and family adamantly opposed to [facility] but CM gave pt's daughter, a Restaurant manager, fast food and explained [facility] has an open [employer] for tours.  Family undecided as to disposition and express they will wait for MDs prompt.  CM notified MD of pt's eligibility for LTAC.  Will continue to monitor.  Mariane Masters, BSN, Cm [phone].  03/23/14 Dessa Phi RN BSN NCM [phone] htn-nitro gtt titrate, for mri head,GOO, full liq diet,upper gi. Recommend PT cons when appropriate.  03/16/14 Dessa Phi RN BSN NCM [phone] transferred to SDU-htn urgency.Nitro gtt-titration, ngt-bilious.Recommend PT cons when appropriate.

## 2014-03-24 NOTE — Progress Notes (Signed)
[facility] Progress Note  Subjective:  No nausea, vomiting, or abdominal pain so far on full liquid diet.  Still has not undergone UGI/SBFT so her nurse is going to check on it and see if they plan to do it later today.  No BM since 12/19 so we will start Miralax daily prn.  Objective:  Vital signs in last 24 hours: Temp:  [98 F (36.7 C)-98.8 F (37.1 C)] 98.2 F (36.8 C) (12/22 0335) Pulse Rate:  [58-73] 69 (12/22 0800) Resp:  [14-21] 17 (12/22 0800) BP: (139-196)/(37-80) 175/70 mmHg (12/22 0800) SpO2:  [97 %-100 %] 100 % (12/22 0800) Last BM Date: 03/21/14 General:  Alert, Well-developed, in NAD Heart:  Regular rate and rhythm; no murmurs Pulm:  CTAB.  No W/R/R. Abdomen:  Soft, non-distended.  BS present.  Non-tender. Neurologic:  Alert and  oriented x4;  grossly normal neurologically. Psych:  Alert and cooperative. Normal mood and affect.  Intake/Output from previous day: 12/21 0701 - 12/22 0700 In: 2295 [P.O.:120; I.V.:2175] Out: [id number] [Urine:3975]   Lab Results:  Recent Labs  03/22/14 0339 03/23/14 0425 03/24/14 0650  WBC 4.7 4.0 5.9  HGB 9.9* 10.2* 10.2*  HCT 28.9* 29.0* 29.4*  PLT 341 373 321   BMET  Recent Labs  03/22/14 0339 03/23/14 0425  NA 127* 125*  K 4.4 4.6  CL 92* 92*  CO2 21 21  GLUCOSE 128* 130*  BUN 4* 5*  CREATININE 0.95 0.94  CALCIUM 9.7 9.4   Mr Brain Wo Contrast  03/23/2014   CLINICAL DATA:  Acute onset of slurred speech. Previous history of CVA.  EXAM: MRI HEAD WITHOUT CONTRAST  TECHNIQUE: Multiplanar, multiecho pulse sequences of the brain and surrounding structures were obtained without intravenous contrast.  COMPARISON:  MR brain 12/24/2013.  CT head 03/22/2014.  FINDINGS: No evidence for acute infarction, hemorrhage, mass lesion, hydrocephalus, or extra-axial fluid. Generalized cerebral and cerebellar atrophy. Extensive T2 and FLAIR hyperintensities throughout the periventricular and subcortical white matter,  consistent with small vessel disease. Flow voids are maintained throughout the carotid, basilar, and vertebral arteries. There are no areas of chronic hemorrhage. Pituitary, pineal, and cerebellar tonsils unremarkable. No upper cervical lesions. Mild pannus, with mild cervical spondylosis. Chronic LEFT cerebellar infarct. Chronic BILATERAL thalamic infarcts. Tiny chronic microhemorrhages in the brain, better appreciated on the previous 3T scan at [facility]. No acute sinus or mastoid disease. Negative orbits. Scalp soft tissues unremarkable.  IMPRESSION: Chronic changes as described. No acute intracranial abnormality. Similar appearance to prior MR.   Electronically Signed   By: Rolla Flatten M.D.   On: 03/23/2014 16:22    Assessment / Plan: -80 y.o. female with incomplete, partial gastric outlet obstruction with tortuous distal stomach seen on EGD 12/17.  This was the same problem she had many years ago that led to her original gastro-jejunostomy. Recent bezoar, difficultly with gastric secretions, however, UGI showed contrast does trickle through into duodenum. Difficult situation. Pt and family do not want any further surgeries. Agree with repeat UGI, SBFT to define anatomy best (has been ordered but not yet performed). She was started on reglan 10 mg IV TID, perhaps this will relieve some of the problem. Would not plan to advance her diet past full liquids on which she can maintain her nutrition. If she proves she cannot tolerate full liquids with reglan, then would consider venting PEG, perhaps PEG-J to allow nutritional support.  Could consider duodenal stent, although that would be controversial in benign  disease. -Malignant HTN/hypertensive urgency -Hyponatremia -Recent slurred speech: Seen by neuro and CT ok. MRI head ok.  No sign of CVA or other acute issues.    LOS: 12 days   ZEHR, JESSICA D.  03/24/2014, 8:55 AM  Pager number [phone]  Still awaiting UGI series.   Pt denies  nausea though po intake has been minimal.  Depending upon results of x-ray would have nutrition evaluate patient.  At a minimum she should receive liquid nutritional supplementation if she can tolerate it.

## 2014-03-25 ENCOUNTER — Inpatient Hospital Stay (HOSPITAL_COMMUNITY): Payer: Medicare Other

## 2014-03-25 NOTE — Progress Notes (Signed)
PT Cancellation Note  Patient Details Name: Shannon [name] MRN: [id number] DOB: 09-Oct-1923   Cancelled Treatment:    Reason Eval/Treat Not Completed: Patient at procedure or test/unavailable (pt about to go for test/procedure, transporter outside room)   York Ram E 03/25/2014, 2:29 PM Carmelia Bake, PT, DPT 03/25/2014 Pager: [phone]

## 2014-03-25 NOTE — Progress Notes (Signed)
[facility]                                                                              Patient Demographics  Shannon [name], is a 80 y.o. female, DOB - 1923-05-17, WUJ:[id number]  Admit date - 03/12/2014   Admitting Physician Eduard Clos, MD  Outpatient Primary MD for the patient is Oneal Grout, MD  LOS - 13   No chief complaint on file.     Brief history 80 y.o. female with remote gastro-jejunostomy for peptic ulcer disease. She unfortunately had a perforation of an anastomotic ulcer at the gastro-jejunal anastomosis in June 2015 and underwent surgery at [facility].Patient was hospitalized here in October 2015 with abdominal pain nausea and vomiting and had endoscopy done twice during that admission with finding of a gastric bezoar.She was discharged home on a pured diet and PPI therapy.She unfortunately present on 12/10 with abd pain/vomiting, CT scan of abdomen showed persistent marked thickening of the pyloric channel and prepyloric antrum and a large filling defect in the stomach measuring 12 x 10 x 15 cm consistent with a bezoar.She was admitted for further evaluation and treatment. GI and Gen Surgery have been consulted. Course complicated with hypertensive urgency and patient is requiring NTG drip. On 03/22/2014, the patient became encephalopathic and had a transient episode of slurred speech. She had no other focal deficits. Neurology was consulted and recommended MRI of the brain. MRI brain negative for acute stroke. As the patient's family does not want any further surgical intervention, general surgery has signed off. The pt's anti-HTN regimen was adjusted and she was weaned off of the NTG drip x 24 hrs and transferred to the medical floor on 03/24/14.  Assessment & Plan   Gastric outlet obstruction -Initially there was question of perforation at the GE junction on CT scan however barium esophagram will this out. -Patient did have NG tube in place but  was removed on 03/17/2014 -EGD conducted on 03/19/2014 showed incomplete gastric outlet obstruction from distal tortuous/anatomically abnormal distal stomach from surgery (gastrojejunostomy) -Gastroenterology, general surgery consult is appreciated -Gen. surgery signed off as patient's family did not want further surgery -Patient is scheduled for small bowel follow-through today -Pending the results of the scan, will discuss PEG tube versus stenting with family  Hypertensive urgency -Patient did require nitroglycerin drip was placed in stepdown however was able to be weaned off -Patient was placed on scheduled hydralazine and labetalol, clonidine, amlodipine suspension and nitroglycerin patch -Suspect once patient is able to tolerate by mouth medications, her blood pressure will improve  Acute encephalopathy -Patient appears to be back at baseline -Thought to be multifactorial including hyponatremia hypertensive encephalopathy, progressive, UTI, cerebral ischemia -Neurology consulted and appreciated, recommended MRI, if no acute abnormalities, no further workup -MRI of the brain without contrast shows no acute changes -TSH 27.66, Synthroid dose was increased to 100 g IV daily -B12: >2000, Folate >1000 -Urinalysis no infection -Opioids and hypnotics were discontinued  Hyponatremia -Possibly secondary to poor oral intake, volume depletion, component of SIADH according to serum and urine osmolarity -Continue to monitor BMP -Continue IVF  Remote history of gastrojejunostomy  -with perforation of anastomotic ulcer June 2015  status post revision and laparotomy -General surgery was consulted however per family no further surgeries wanted -Small bowel follow through pending -Patient does feel hungry, denies nausea -Flatus and having small bowel movement  Atypical chest pain -Resolved -EKG showed nonspecific changes -Possibly secondary to hypertensive urgency versus GERD -Troponins were  negative 3  Diabetes mellitus, type II -12/24/2013, hemoglobin A1c 6.9 -Continue to monitor CBGs  Chronic kidney disease, stage III -Based and creatinine appears to be 0.9-1.3 -Continue to monitor BMP  Hypokalemia -Continue to replete as needed -Magnesium within normal limits  Hypothyroidism -Synthroid increased to 100 g IV daily -TSH 27.660  History CVA -Continue aspirin suppositories, no new deficits noted -Neurology consulted and appreciated  Depression -Home medications currently held  Code Status: Full  Family Communication: none at bedside, daughter via phone  Disposition Plan: Admitted, will speak to CM regarding LTAC placement  Time Spent in minutes   30 minutes  Procedures  EGD  Consults   General surgery Gastroenterology Neurology  DVT Prophylaxis  SCDs  Lab Results  Component Value Date   PLT 321 03/24/2014    Medications  Scheduled Meds: . amLODipine  10 mg Oral Daily  . antiseptic oral rinse  7 mL Mouth Rinse q12n4p  . aspirin  300 mg Rectal Daily  . chlorhexidine  15 mL Mouth Rinse BID  . cloNIDine  0.3 mg Transdermal Weekly  . cloNIDine  0.2 mg Oral TID  . cycloSPORINE  1 drop Both Eyes BID  . hydrALAZINE  10 mg Intravenous 4 times per day  . labetalol  20 mg Intravenous Q6H  . lactose free nutrition  237 mL Oral BID BM  . latanoprost  1 drop Right Eye QHS  . levothyroxine  100 mcg Intravenous Daily  . metoCLOPramide (REGLAN) injection  10 mg Intravenous 3 times per day  . nitroGLYCERIN  0.2 mg Transdermal Daily  . pantoprazole (PROTONIX) IV  40 mg Intravenous Q12H   Continuous Infusions: . nitroGLYCERIN Stopped (03/22/14 1600)  . sodium chloride 0.9 % 1,000 mL with potassium chloride 20 mEq infusion 100 mL/hr at 03/24/14 0600   PRN Meds:.Place/Maintain arterial line **AND** sodium chloride, acetaminophen (TYLENOL) oral liquid 160 mg/5 mL **OR** acetaminophen, hydrALAZINE, nitroGLYCERIN, ondansetron **OR** ondansetron (ZOFRAN)  IV, polyethylene glycol, polyvinyl alcohol  Antibiotics    Anti-infectives    None      Subjective:   Shannon [name] seen and examined today.   Patient states she is not feeling well this morning however cannot elaborate. She denies any nausea or vomiting or abdominal pain. She does state she is very hungry. She denies any chest pain or shortness of breath at this time.   Objective:   Filed Vitals:   03/24/14 2345 03/25/14 0349 03/25/14 0500 03/25/14 0518  BP: 172/63 164/61  158/69  Pulse: 67   66  Temp:    98.2 F (36.8 C)  TempSrc:    Oral  Resp:    16  Height:      Weight:   60.7 kg (133 lb 13.1 oz)   SpO2:    100%    Wt Readings from Last 3 Encounters:  03/25/14 60.7 kg (133 lb 13.1 oz)  03/12/14 59.875 kg (132 lb)  02/24/14 68.04 kg (150 lb)     Intake/Output Summary (Last 24 hours) at 03/25/14 1126 Last data filed at 03/25/14 0915  Gross per 24 hour  Intake   2500 ml  Output   1250 ml  Net   1250 ml  Exam  General: Well developed, well nourished, NAD, appears stated age  HEENT: NCAT,  mucous membranes moist.   Cardiovascular: S1 S2 auscultated, no rubs, murmurs or gallops. Regular rate and rhythm.  Respiratory: Clear to auscultation bilaterally with equal chest rise  Abdomen: Soft, nontender, nondistended, + bowel sounds  Extremities: warm dry without cyanosis clubbing or edema  Neuro: AAOx3, no focal deficits  Psych: Depressed  Data Review   Micro Results Recent Results (from the past 240 hour(s))  MRSA PCR Screening     Status: None   Collection Time: 03/16/14 12:36 PM  Result Value Ref Range Status   MRSA by PCR NEGATIVE NEGATIVE Final    Comment:        The GeneXpert MRSA Assay (FDA approved for NASAL specimens only), is one component of a comprehensive MRSA colonization surveillance program. It is not intended to diagnose MRSA infection nor to guide or monitor treatment for MRSA infections.   Urine culture     Status: None    Collection Time: 03/22/14 10:00 AM  Result Value Ref Range Status   Specimen Description URINE, CATHETERIZED  Final   Special Requests NONE  Final   Culture  Setup Time   Final    03/22/2014 16:51 Performed at [facility]    Colony Count NO GROWTH Performed at [facility]   Final   Culture NO GROWTH Performed at [facility]   Final   Report Status 03/23/2014 FINAL  Final    Radiology Reports Ct Abdomen Pelvis Wo Contrast  03/12/2014   CLINICAL DATA:  Constant vomiting today, constipated since Saturday, RIGHT lower quadrant rectal pain, history diabetes, hypertension, prior gastric bezoar  EXAM: CT ABDOMEN AND PELVIS WITHOUT CONTRAST  TECHNIQUE: Multidetector CT imaging of the abdomen and pelvis was performed following the standard protocol without IV contrast. Sagittal and coronal MPR images reconstructed from axial data set.  COMPARISON:  03/07/2014  FINDINGS: Minimal bibasilar atelectasis.  Low-attenuation foci within liver, question cysts, unchanged.  Metallic foreign body at LEFT ventricle again identified, uncertain etiology.  Gallbladder surgically absent.  Probable tiny LEFT renal cyst 9 mm diameter.  Within limits of a nonenhanced exam, remainder of liver, spleen, pancreas, kidneys and adrenal glands normal appearance.  Persistent marked thickening of the pyloric channel and pre pyloric antrum gastric distention question partial gastric outlet obstruction.  Small amount of ingested contrast is present within proximal small bowel loops.  Large filling defect within the stomach, 12.2 x 10.0 x 15.2 cm, containing bubbly gas, consistent with recurrent bezoar.  Scattered colonic diverticula.  No evidence of bowel obstruction or dilatation.  Extensive atherosclerotic calcifications aorta and minimally in coronary arteries.  High attenuation nodule 13 mm diameter again identified adjacent to the thickened gastroesophageal junction, unchanged but since 03/07/2014 but  new since 01/17/2014, question contrast within epiphrenic diverticulum or small focal contained perforation, unlikely calcified lymph node since this is new in past 2 months.  Unremarkable bladder, ureters, uterus and adnexae.  Appendix not visualized.  No definite rectal wall thickening.  No additional mass, adenopathy, free fluid, free air or abscess.  Bones demineralized with scattered degenerative changes.  Surgical scar at ventral abdomen with a small incisional hernia containing a portion of transverse colon.  IMPRESSION: Thickened pylorus and pre-pyloric antrum likely causing partial outlet obstruction.  Large filling defect within stomach consistent with recurrent bezoar 12.2 x 10.0 x 15.2 cm.  Colonic diverticulosis.  Suspected hepatic cysts unchanged.  13 mm high attenuation nodule  adjacent to thickened gastroesophageal junction, question contained perforation versus epiphrenic diverticulum containing contrast, new since 01/17/2014 but unchanged since 03/07/2014.  Small incisional hernia containing a portion of the transverse colon wall without obstruction.   Electronically Signed   By: Ulyses Southward M.D.   On: 03/12/2014 23:12   Ct Head Wo Contrast  03/22/2014   CLINICAL DATA:  Acute onset of slurred speech.  Initial encounter.  EXAM: CT HEAD WITHOUT CONTRAST  TECHNIQUE: Contiguous axial images were obtained from the base of the skull through the vertex without intravenous contrast.  COMPARISON:  CT of the head performed 12/23/2013, and MRI of the brain performed 12/24/2013  FINDINGS: There is no evidence of acute infarction, mass lesion, or intra- or extra-axial hemorrhage on CT.  Prominence of the ventricles and sulci reflects mild to moderate cortical volume loss. Cerebellar atrophy is noted. Scattered periventricular and subcortical white matter change likely reflects small vessel ischemic microangiopathy. A chronic infarct is noted at the left cerebellar hemisphere.  The brainstem and fourth  ventricle are within normal limits. The basal ganglia are unremarkable in appearance. The cerebral hemispheres demonstrate grossly normal gray-white differentiation. No mass effect or midline shift is seen.  There is no evidence of fracture; visualized osseous structures are unremarkable in appearance. The visualized portions of the orbits are within normal limits. The paranasal sinuses and mastoid air cells are well-aerated. No significant soft tissue abnormalities are seen.  IMPRESSION: 1. No acute intracranial pathology seen on CT. 2. Mild to moderate cortical volume loss and scattered small vessel ischemic microangiopathy. 3. 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   Electronically Signed   By: Roanna Raider M.D.   On: 03/22/2014 03:30   Mr Brain Wo Contrast  03/23/2014   CLINICAL DATA:  Acute onset of slurred speech. Previous history of CVA.  EXAM: MRI HEAD WITHOUT CONTRAST  TECHNIQUE: Multiplanar, multiecho pulse sequences of the brain and surrounding structures were obtained without intravenous contrast.  COMPARISON:  MR brain 12/24/2013.  CT head 03/22/2014.  FINDINGS: No evidence for acute infarction, hemorrhage, mass lesion, hydrocephalus, or extra-axial fluid. Generalized cerebral and cerebellar atrophy. Extensive T2 and FLAIR hyperintensities throughout the periventricular and subcortical white matter, consistent with small vessel disease. Flow voids are maintained throughout the carotid, basilar, and vertebral arteries. There are no areas of chronic hemorrhage. Pituitary, pineal, and cerebellar tonsils unremarkable. No upper cervical lesions. Mild pannus, with mild cervical spondylosis. Chronic LEFT cerebellar infarct. Chronic BILATERAL thalamic infarcts. Tiny chronic microhemorrhages in the brain, better appreciated on the previous 3T scan at [facility]. No acute sinus or mastoid disease. Negative orbits. Scalp soft tissues unremarkable.  IMPRESSION: Chronic changes as described. No  acute intracranial abnormality. Similar appearance to prior MR.   Electronically Signed   By: Davonna Belling M.D.   On: 03/23/2014 16:22   Ct Abdomen Pelvis W Contrast  03/07/2014   CLINICAL DATA:  80 year old female presenting with rectal pressure gradually worsening over the prior month.  EXAM: CT ABDOMEN AND PELVIS WITH CONTRAST  TECHNIQUE: Multidetector CT imaging of the abdomen and pelvis was performed using the standard protocol following bolus administration of intravenous contrast.  CONTRAST:  50mL OMNIPAQUE IOHEXOL 300 MG/ML SOLN, 80mL OMNIPAQUE IOHEXOL 300 MG/ML SOLN  COMPARISON:  CT of the abdomen and pelvis 01/17/2014.  FINDINGS: Lower chest: Cardiomegaly with biatrial dilatation. Calcifications of the aortic valve. Atherosclerotic calcifications in the left main and left circumflex coronary arteries. Small hiatal hernia.  Hepatobiliary: Multifocal low-attenuation lesions appear unchanged  in size, number and distribution scattered throughout the liver. Many these are too small to definitively characterize, while the larger lesions are intermediate attenuation, favored to represent proteinaceous cysts. The largest of these lesions measures only 12 mm in segment 2 (image 21 of series 2). No new hepatic lesions are noted. No intra or extrahepatic biliary ductal dilatation. Status post cholecystectomy.  Pancreas: Unremarkable.  Spleen: Unremarkable.  Adrenals/Urinary Tract: Again noted is a 1.2 cm intermediate attenuation (23 HU) lesion in the lower pole of the left kidney, with some surrounding architectural distortion, favored to represent a previously ruptured proteinaceous cyst. No suspicious renal lesions are noted. Multifocal cortical thinning in the kidneys bilaterally, presumably mild scarring. No hydroureteronephrosis. Urinary bladder is normal in appearance. Bilateral adrenal glands are normal in appearance.  Stomach/Bowel: As with the prior examination there is extensive thickening of the gastric  wall in the antral pre-pyloric region of the stomach. The pylorus is also markedly thickened (up to 15 mm in thickness). The stomach is moderately distended. No pathologic dilatation of small bowel or colon. Postoperative changes of partial colectomy near the rectosigmoid junction are noted. A few scattered colonic diverticulae are noted, without surrounding inflammatory changes to suggest an acute diverticulitis at this time. The distal rectum and anus are grossly unremarkable in appearance.  Vascular/Lymphatic: Extensive atherosclerosis throughout the abdominal and pelvic vasculature, without evidence of aneurysm or dissection. No lymphadenopathy noted in the abdomen or pelvis.  Reproductive: Retroverted uterus.  Ovaries are atrophic.  Other: No significant volume of ascites.  No pneumoperitoneum.  Musculoskeletal: Tiny ventral hernia, through which there is incomplete herniation of the mid transverse colon; no findings to suggest associated bowel incarceration or obstruction at this time. Old compression fracture of superior endplate of T11 with approximately 20% loss of anterior vertebral body height (unchanged). There are no aggressive appearing lytic or blastic lesions noted in the visualized portions of the skeleton.  IMPRESSION: 1. No findings to account for the patient's worsening rectal pressure. 2. There is persistent thickening of the pylorus and antral pre-pyloric region of the stomach, which could be inflammatory in the setting of ulcer disease, or could be neoplastic. There is persistent moderate dilatation of the stomach, suggesting some degree of gastric outlet obstruction. Correlation with results of recent upper endoscopy is suggested. 3. Additional incidental findings, as above, similar to prior examination.   Electronically Signed   By: Trudie Reed M.D.   On: 03/07/2014 16:28   Dg Chest Port 1 View  03/14/2014   CLINICAL DATA:  80 year old female with new onset chest pain beginning  today  EXAM: PORTABLE CHEST - 1 VIEW  COMPARISON:  None.  FINDINGS: Cardiomegaly. Atherosclerotic calcification present in the transverse aorta. No focal airspace consolidation, pulmonary edema, pneumothorax. a query trace left pleural effusion. No acute osseous abnormality. The left glenohumeral joint osteoarthritis. Mild coarsening of the interstitial markings.  IMPRESSION: 1. Query trace left pleural effusion. 2. Cardiomegaly. 3. Aortic atherosclerosis.   Electronically Signed   By: Malachy Moan M.D.   On: 03/14/2014 10:12   Dg Abd Portable 1v  03/15/2014   CLINICAL DATA:  Nasogastric tube placement.  EXAM: PORTABLE ABDOMEN - 1 VIEW  COMPARISON:  03/14/2014  FINDINGS: The nasogastric tube is in place, tip coiled within the stomach and tip overlying the region of the fundus. Surgical clips are noted in the right upper quadrant. There is residual contrast in the colon and distal small bowel following esophagram on 03/13/2014. Bowel gas pattern is nonobstructive.  IMPRESSION: Nasogastric tube coiled within the stomach.   Electronically Signed   By: Rosalie Gums M.D.   On: 03/15/2014 10:49   Dg Abd Portable 2v  03/14/2014   CLINICAL DATA:  80 year old female with persistent abdominal pain and fullness.  EXAM: PORTABLE ABDOMEN - 2 VIEW  COMPARISON:  Esophagram performed yesterday. CT abdomen/ pelvis 03/12/2014  FINDINGS: No evidence of free air on the lateral decubitus view. Contrast material is noted within the stomach and scattered throughout the large bowel. There is no evidence of obstruction. The volume of contrast material remaining within the stomach is greater than expected. The bones are diffusely osteopenic. Surgical clips in the right upper quadrant suggest prior cholecystectomy.  IMPRESSION: 1. No evidence of obstruction or free air. 2. Greater than expected volume of retained oral contrast within the stomach. Query clinical history of delayed gastric emptying or gastroparesis? 3. The bones  appear osteopenic.   Electronically Signed   By: Malachy Moan M.D.   On: 03/14/2014 10:04   Dg Esophagus W/water Sol Cm  03/13/2014   CLINICAL DATA:  Abnormal CT, evaluate for esophageal perforation  EXAM: ESOPHOGRAM/BARIUM SWALLOW  TECHNIQUE: Single contrast examination was performed using water-soluble contrast.  FLUOROSCOPY TIME:  1 min 7 seconds  COMPARISON:  CT abdomen pelvis dated 03/12/2014 and 03/07/2014  FINDINGS: Esophageal dysmotility.  No fixed esophageal narrowing/stricture.  Suspected small paraesophageal hernia (less likely hiatal hernia or esophageal diverticulum) at the GE junction. The appearance does not suggest a contained esophageal perforation.  Presence of gastroesophageal reflux could not be assessed due to the patient's inability to clear her esophagus.  Moderate food/debris in the stomach.  IMPRESSION: Esophageal dysmotility.  No fixed esophageal narrowing/stricture.  Suspected small paraesophageal hernia at the GE junction. No evidence of contained esophageal perforation.  Moderate food/debris in the stomach.   Electronically Signed   By: Charline Bills M.D.   On: 03/13/2014 09:44    CBC  Recent Labs Lab 03/19/14 0520 03/21/14 0528 03/22/14 0339 03/23/14 0425 03/24/14 0650  WBC 5.2 4.4 4.7 4.0 5.9  HGB 9.7* 8.8* 9.9* 10.2* 10.2*  HCT 27.6* 25.0* 28.9* 29.0* 29.4*  PLT 309 303 341 373 321  MCV 88.5 88.3 87.6 87.3 87.2  MCH 31.1 31.1 30.0 30.7 30.3  MCHC 35.1 35.2 34.3 35.2 34.7  RDW 15.7* 15.9* 15.8* 15.7* 15.6*    Chemistries   Recent Labs Lab 03/19/14 0520 03/22/14 0339 03/23/14 0425 03/24/14 0650  NA 132* 127* 125* 126*  K 4.0 4.4 4.6 4.4  CL 100 92* 92* 95*  CO2 19 21 21 23   GLUCOSE 120* 128* 130* 135*  BUN 6 4* 5* 6  CREATININE 0.94 0.95 0.94 0.85  CALCIUM 9.3 9.7 9.4 9.1   ------------------------------------------------------------------------------------------------------------------ estimated creatinine clearance is 42.8 mL/min  (by C-G formula based on Cr of 0.85). ------------------------------------------------------------------------------------------------------------------ No results for input(s): HGBA1C in the last 72 hours. ------------------------------------------------------------------------------------------------------------------ No results for input(s): CHOL, HDL, LDLCALC, TRIG, CHOLHDL, LDLDIRECT in the last 72 hours. ------------------------------------------------------------------------------------------------------------------ No results for input(s): TSH, T4TOTAL, T3FREE, THYROIDAB in the last 72 hours.  Invalid input(s): FREET3 ------------------------------------------------------------------------------------------------------------------ No results for input(s): VITAMINB12, FOLATE, FERRITIN, TIBC, IRON, RETICCTPCT in the last 72 hours.  Coagulation profile No results for input(s): INR, PROTIME in the last 168 hours.  No results for input(s): DDIMER in the last 72 hours.  Cardiac Enzymes No results for input(s): CKMB, TROPONINI, MYOGLOBIN in the last 168 hours.  Invalid input(s): CK ------------------------------------------------------------------------------------------------------------------ Invalid input(s): POCBNP    Khilynn Borntreger  D.O. on 03/25/2014 at 11:26 AM  Between 7am to 7pm - Pager - [phone]  After 7pm go to [email or web address] - password [email or web address]  And look for the night coverage person covering for me after hours  [facility] Office  [phone]

## 2014-03-25 NOTE — Progress Notes (Signed)
[facility] Progress Note  Subjective:  Feels about the same.  No new complaints.  Objective:  Vital signs in last 24 hours: Temp:  [97.7 F (36.5 C)-98.9 F (37.2 C)] 98.2 F (36.8 C) (12/23 0518) Pulse Rate:  [32-72] 66 (12/23 0518) Resp:  [15-23] 16 (12/23 0518) BP: (151-201)/(53-81) 158/69 mmHg (12/23 0518) SpO2:  [92 %-100 %] 100 % (12/23 0518) Weight:  [133 lb 13.1 oz (60.7 kg)-148 lb 5.9 oz (67.3 kg)] 133 lb 13.1 oz (60.7 kg) (12/23 0500) Last BM Date: 03/21/14 General:  Alert, Well-developed, in NAD Heart:  Regular rate and rhythm; no murmurs Pulm:  CTAB.  No W/R/R. Abdomen:  Soft, non-distended. Normal bowel sounds.  Non-tender.   Extremities:  Without edema. Neurologic:  Alert and  oriented x4;  grossly normal neurologically. Psych:  Alert and cooperative. Normal mood and affect.  Intake/Output from previous day: 12/22 0701 - 12/23 0700 In: 2900 [P.O.:600; I.V.:2300] Out: 1450 [Urine:1450] Intake/Output this shift: Total I/O In: -  Out: 200 [Urine:200]  Lab Results:  Recent Labs  03/23/14 0425 03/24/14 0650  WBC 4.0 5.9  HGB 10.2* 10.2*  HCT 29.0* 29.4*  PLT 373 321   BMET  Recent Labs  03/23/14 0425 03/24/14 0650  NA 125* 126*  K 4.6 4.4  CL 92* 95*  CO2 21 23  GLUCOSE 130* 135*  BUN 5* 6  CREATININE 0.94 0.85  CALCIUM 9.4 9.1   Mr Brain Wo Contrast  03/23/2014   CLINICAL DATA:  Acute onset of slurred speech. Previous history of CVA.  EXAM: MRI HEAD WITHOUT CONTRAST  TECHNIQUE: Multiplanar, multiecho pulse sequences of the brain and surrounding structures were obtained without intravenous contrast.  COMPARISON:  MR brain 12/24/2013.  CT head 03/22/2014.  FINDINGS: No evidence for acute infarction, hemorrhage, mass lesion, hydrocephalus, or extra-axial fluid. Generalized cerebral and cerebellar atrophy. Extensive T2 and FLAIR hyperintensities throughout the periventricular and subcortical white matter, consistent with small  vessel disease. Flow voids are maintained throughout the carotid, basilar, and vertebral arteries. There are no areas of chronic hemorrhage. Pituitary, pineal, and cerebellar tonsils unremarkable. No upper cervical lesions. Mild pannus, with mild cervical spondylosis. Chronic LEFT cerebellar infarct. Chronic BILATERAL thalamic infarcts. Tiny chronic microhemorrhages in the brain, better appreciated on the previous 3T scan at [facility]. No acute sinus or mastoid disease. Negative orbits. Scalp soft tissues unremarkable.  IMPRESSION: Chronic changes as described. No acute intracranial abnormality. Similar appearance to prior MR.   Electronically Signed   By: Rolla Flatten M.D.   On: 03/23/2014 16:22    Assessment / Plan: -80 y.o. female with incomplete, partial gastric outlet obstruction with tortuous distal stomach seen on EGD 12/17. This was the same problem she had many years ago that led to her original gastro-jejunostomy. Recent bezoar, difficultly with gastric secretions, however, UGI showed contrast does trickle through into duodenum. Difficult situation. Pt and family do not want any further surgeries. Agree with repeat UGI, SBFT to define anatomy best (has been ordered but not yet performed). She was started on reglan 10 mg IV TID, perhaps this will relieve some of the problem. Would not plan to advance her diet past full liquids on which she can maintain her nutrition. If she proves she cannot tolerate full liquids with reglan, then would consider venting PEG, perhaps PEG-J to allow nutritional support. Could consider duodenal stent, although that would be controversial in benign disease.  Pending UGI/SBFT results, will likely need follow-up with nutritional services (  they saw her last week). -Malignant HTN/hypertensive urgency -Hyponatremia -Recent slurred speech: Seen by neuro and CT ok. MRI head ok. No sign of CVA or other acute issues.   LOS: 13 days   ZEHR, JESSICA D.   03/25/2014, 9:26 AM   GI Attending Note  I have personally taken an interval history, reviewed the chart, and examined the patient.  I agree with the extender's note, impression and recommendations.  Still awaiting UGI  Sandy Salaam. Deatra Ina, MD, [facility] [phone]   Pager number [phone]

## 2014-03-25 NOTE — Progress Notes (Signed)
Assumed care of patient from [facility]. Cont with plan of care.

## 2014-03-26 DIAGNOSIS — E1165 Type 2 diabetes mellitus with hyperglycemia: Secondary | ICD-10-CM | POA: Diagnosis present

## 2014-03-26 DIAGNOSIS — Z66 Do not resuscitate: Secondary | ICD-10-CM | POA: Diagnosis present

## 2014-03-26 DIAGNOSIS — Z7982 Long term (current) use of aspirin: Secondary | ICD-10-CM | POA: Diagnosis not present

## 2014-03-26 DIAGNOSIS — R079 Chest pain, unspecified: Secondary | ICD-10-CM | POA: Diagnosis not present

## 2014-03-26 DIAGNOSIS — G629 Polyneuropathy, unspecified: Secondary | ICD-10-CM | POA: Diagnosis not present

## 2014-03-26 DIAGNOSIS — M19019 Primary osteoarthritis, unspecified shoulder: Secondary | ICD-10-CM | POA: Diagnosis present

## 2014-03-26 DIAGNOSIS — R278 Other lack of coordination: Secondary | ICD-10-CM | POA: Diagnosis not present

## 2014-03-26 DIAGNOSIS — Z9049 Acquired absence of other specified parts of digestive tract: Secondary | ICD-10-CM | POA: Diagnosis present

## 2014-03-26 DIAGNOSIS — I517 Cardiomegaly: Secondary | ICD-10-CM | POA: Diagnosis not present

## 2014-03-26 DIAGNOSIS — K432 Incisional hernia without obstruction or gangrene: Secondary | ICD-10-CM | POA: Diagnosis present

## 2014-03-26 DIAGNOSIS — M6281 Muscle weakness (generalized): Secondary | ICD-10-CM | POA: Diagnosis not present

## 2014-03-26 DIAGNOSIS — H409 Unspecified glaucoma: Secondary | ICD-10-CM | POA: Diagnosis not present

## 2014-03-26 DIAGNOSIS — N183 Chronic kidney disease, stage 3 (moderate): Secondary | ICD-10-CM | POA: Diagnosis not present

## 2014-03-26 DIAGNOSIS — R2681 Unsteadiness on feet: Secondary | ICD-10-CM | POA: Diagnosis not present

## 2014-03-26 DIAGNOSIS — K5730 Diverticulosis of large intestine without perforation or abscess without bleeding: Secondary | ICD-10-CM | POA: Diagnosis present

## 2014-03-26 DIAGNOSIS — K224 Dyskinesia of esophagus: Secondary | ICD-10-CM | POA: Diagnosis present

## 2014-03-26 DIAGNOSIS — E871 Hypo-osmolality and hyponatremia: Secondary | ICD-10-CM | POA: Diagnosis not present

## 2014-03-26 DIAGNOSIS — K3184 Gastroparesis: Secondary | ICD-10-CM | POA: Diagnosis not present

## 2014-03-26 DIAGNOSIS — H04123 Dry eye syndrome of bilateral lacrimal glands: Secondary | ICD-10-CM | POA: Diagnosis not present

## 2014-03-26 DIAGNOSIS — K449 Diaphragmatic hernia without obstruction or gangrene: Secondary | ICD-10-CM | POA: Diagnosis present

## 2014-03-26 DIAGNOSIS — I10 Essential (primary) hypertension: Secondary | ICD-10-CM | POA: Diagnosis not present

## 2014-03-26 DIAGNOSIS — E039 Hypothyroidism, unspecified: Secondary | ICD-10-CM | POA: Diagnosis present

## 2014-03-26 DIAGNOSIS — G9340 Encephalopathy, unspecified: Secondary | ICD-10-CM | POA: Diagnosis not present

## 2014-03-26 DIAGNOSIS — N189 Chronic kidney disease, unspecified: Secondary | ICD-10-CM | POA: Diagnosis not present

## 2014-03-26 DIAGNOSIS — K311 Adult hypertrophic pyloric stenosis: Secondary | ICD-10-CM | POA: Diagnosis not present

## 2014-03-26 DIAGNOSIS — I700 Atherosclerosis of aorta: Secondary | ICD-10-CM | POA: Diagnosis present

## 2014-03-26 DIAGNOSIS — E222 Syndrome of inappropriate secretion of antidiuretic hormone: Secondary | ICD-10-CM | POA: Diagnosis present

## 2014-03-26 DIAGNOSIS — Z8673 Personal history of transient ischemic attack (TIA), and cerebral infarction without residual deficits: Secondary | ICD-10-CM | POA: Diagnosis not present

## 2014-03-26 DIAGNOSIS — K219 Gastro-esophageal reflux disease without esophagitis: Secondary | ICD-10-CM | POA: Diagnosis present

## 2014-03-26 DIAGNOSIS — E785 Hyperlipidemia, unspecified: Secondary | ICD-10-CM | POA: Diagnosis not present

## 2014-03-26 DIAGNOSIS — M47812 Spondylosis without myelopathy or radiculopathy, cervical region: Secondary | ICD-10-CM | POA: Diagnosis present

## 2014-03-26 DIAGNOSIS — R1084 Generalized abdominal pain: Secondary | ICD-10-CM | POA: Diagnosis not present

## 2014-03-26 DIAGNOSIS — K5660 Unspecified intestinal obstruction: Secondary | ICD-10-CM | POA: Diagnosis not present

## 2014-03-26 DIAGNOSIS — G319 Degenerative disease of nervous system, unspecified: Secondary | ICD-10-CM | POA: Diagnosis present

## 2014-03-26 DIAGNOSIS — R0789 Other chest pain: Secondary | ICD-10-CM | POA: Diagnosis not present

## 2014-03-26 LAB — BASIC METABOLIC PANEL
Anion gap: 5 (ref 5–15)
BUN: 8 mg/dL (ref 6–23)
CO2: 23 mmol/L (ref 19–32)
Calcium: 9.1 mg/dL (ref 8.4–10.5)
Chloride: 97 mEq/L (ref 96–112)
Creatinine, Ser: 0.8 mg/dL (ref 0.50–1.10)
GFR calc Af Amer: 74 mL/min — ABNORMAL LOW (ref >90)
GFR calc non Af Amer: 64 mL/min — ABNORMAL LOW (ref >90)
Glucose, Bld: 142 mg/dL — ABNORMAL HIGH (ref 70–99)
Potassium: 4.3 mmol/L (ref 3.5–5.1)
Sodium: 125 mmol/L — ABNORMAL LOW (ref 135–145)

## 2014-03-26 LAB — CBC
HCT: 29.4 % — ABNORMAL LOW (ref 36.0–46.0)
Hemoglobin: 10.3 g/dL — ABNORMAL LOW (ref 12.0–15.0)
MCH: 30.4 pg (ref 26.0–34.0)
MCHC: 35 g/dL (ref 30.0–36.0)
MCV: 86.7 fL (ref 78.0–100.0)
Platelets: 317 10*3/uL (ref 150–400)
RBC: 3.39 MIL/uL — ABNORMAL LOW (ref 3.87–5.11)
RDW: 15.5 % (ref 11.5–15.5)
WBC: 4.7 10*3/uL (ref 4.0–10.5)

## 2014-03-26 LAB — ALDOSTERONE + RENIN ACTIVITY W/ RATIO
Aldosterone: <1 ng/dL
PRA LC/MS/MS: 0.15 ng/mL/h — ABNORMAL LOW (ref 0.25–5.82)

## 2014-03-26 MED ORDER — CHLORHEXIDINE GLUCONATE 0.12 % MT SOLN: 15.0000 mL | Freq: Two times a day (BID) | OROMUCOSAL | Status: DC

## 2014-03-26 MED ORDER — CLONIDINE HCL 0.3 MG/24HR TD PTWK: 0.3000 mg | MEDICATED_PATCH | TRANSDERMAL | Status: DC

## 2014-03-26 MED ORDER — LABETALOL HCL 5 MG/ML IV SOLN: 20.0000 mg | Freq: Four times a day (QID) | INTRAVENOUS | Status: DC

## 2014-03-26 MED ORDER — HYDRALAZINE HCL 20 MG/ML IJ SOLN: 10.0000 mg | Freq: Four times a day (QID) | INTRAMUSCULAR | Status: DC

## 2014-03-26 MED ORDER — POLYVINYL ALCOHOL 1.4 % OP SOLN: 1.0000 [drp] | OPHTHALMIC | Status: DC | PRN

## 2014-03-26 MED ORDER — BOOST PLUS PO LIQD: 237.0000 mL | Freq: Two times a day (BID) | ORAL | Status: DC

## 2014-03-26 MED ORDER — METOCLOPRAMIDE HCL 5 MG/ML IJ SOLN: 10.0000 mg | Freq: Three times a day (TID) | INTRAMUSCULAR | Status: DC

## 2014-03-26 MED ORDER — PANTOPRAZOLE SODIUM 40 MG PO TBEC
40.0000 mg | DELAYED_RELEASE_TABLET | Freq: Two times a day (BID) | ORAL | Status: DC
Start: 2014-03-26 — End: 2014-06-05

## 2014-03-26 MED ORDER — LATANOPROST 0.005 % OP SOLN: 1.0000 [drp] | Freq: Every day | OPHTHALMIC | Status: DC

## 2014-03-26 MED ORDER — ONDANSETRON HCL 4 MG PO TABS: 4.0000 mg | ORAL_TABLET | Freq: Four times a day (QID) | ORAL | Status: DC | PRN

## 2014-03-26 MED ORDER — METOPROLOL SUCCINATE ER 50 MG PO TB24: 50.0000 mg | ORAL_TABLET | Freq: Every day | ORAL | Status: DC

## 2014-03-26 MED ORDER — LEVOTHYROXINE SODIUM 100 MCG IV SOLR: 100.0000 ug | Freq: Every day | INTRAVENOUS | Status: DC

## 2014-03-26 MED ORDER — PANTOPRAZOLE SODIUM 40 MG IV SOLR: 40.0000 mg | Freq: Two times a day (BID) | INTRAVENOUS | Status: DC

## 2014-03-26 MED ORDER — CETYLPYRIDINIUM CHLORIDE 0.05 % MT LIQD: 7.0000 mL | Freq: Two times a day (BID) | OROMUCOSAL | Status: DC

## 2014-03-26 MED ORDER — AMLODIPINE 1 MG/ML ORAL SUSPENSION: 10.0000 mg | Freq: Every day | ORAL | Status: DC

## 2014-03-26 MED ORDER — LEVOTHYROXINE SODIUM 200 MCG PO TABS: 200.0000 ug | ORAL_TABLET | Freq: Every day | ORAL | Status: DC

## 2014-03-26 MED ORDER — CLONIDINE ORAL SUSPENSION 10 MCG/ML: 0.2000 mg | Freq: Three times a day (TID) | ORAL | Status: DC

## 2014-03-26 MED ORDER — ACETAMINOPHEN 160 MG/5ML PO SOLN: 650.0000 mg | Freq: Four times a day (QID) | ORAL | Status: DC | PRN

## 2014-03-26 MED ORDER — POLYETHYLENE GLYCOL 3350 17 G PO PACK: 17.0000 g | PACK | Freq: Every day | ORAL | Status: DC | PRN

## 2014-03-26 MED ORDER — NITROGLYCERIN 0.2 MG/HR TD PT24: 0.2000 mg | MEDICATED_PATCH | Freq: Every day | TRANSDERMAL | Status: DC

## 2014-03-26 NOTE — Progress Notes (Signed)
[facility] Progress Note  Subjective:  Still having high BP's.  UGI/SBFT normal.  No nausea, vomiting, or abdominal pain on full liquids and Reglan.  Objective:  Vital signs in last 24 hours: Temp:  [98.1 F (36.7 C)-98.7 F (37.1 C)] 98.7 F (37.1 C) (12/24 0514) Pulse Rate:  [62-74] 62 (12/24 0514) Resp:  [16-20] 20 (12/24 0514) BP: (143-200)/(57-82) 178/82 mmHg (12/24 0537) SpO2:  [98 %-100 %] 98 % (12/24 0514) Weight:  [136 lb 14.5 oz (62.1 kg)] 136 lb 14.5 oz (62.1 kg) (12/24 0514) Last BM Date: 03/22/14 General:  Alert, Well-developed, in NAD Heart:  Regular rate and rhythm; no murmurs Pulm:  CTAB.  No W/R/R. Abdomen:  Soft, non-distended.  BS present.  Non-tender. Extremities:  Without edema. Neurologic:  Alert and  oriented x4;  grossly normal neurologically. Psych:  Alert and cooperative. Normal mood and affect.  Intake/Output from previous day: 12/23 0701 - 12/24 0700 In: 2858.3 [P.O.:360; I.V.:2498.3] Out: 200 [Urine:200]  Lab Results:  Recent Labs  03/24/14 0650 03/26/14 0409  WBC 5.9 4.7  HGB 10.2* 10.3*  HCT 29.4* 29.4*  PLT 321 317   BMET  Recent Labs  03/24/14 0650 03/26/14 0409  NA 126* 125*  K 4.4 4.3  CL 95* 97  CO2 23 23  GLUCOSE 135* 142*  BUN 6 8  CREATININE 0.85 0.80  CALCIUM 9.1 9.1   Dg Ugi W/small Bowel  03/25/2014   CLINICAL DATA:  Nausea and vomiting; history of bezoar.  EXAM: UPPER GI SERIES WITH SMALL BOWEL FOLLOW-THROUGH using thin barium.  FLUOROSCOPY TIME:  1 min, 17 seconds  TECHNIQUE: Combined double contrast and single contrast upper GI series using effervescent crystals, thick barium, and thin barium. Subsequently, serial images of the small bowel were obtained including spot views of the terminal ileum.  COMPARISON:  Abdominal film of March 15, 2014.  FINDINGS: [facility] films from 22 December and today were reviewed. A moderate stool burden in the right colon was noted. Scattered diverticula  containing radiodense contrast were noted in the left colon.  The patient ingested the barium without difficulty. The thoracic esophagus demonstrated mild changes of presbyesophagus but no evidence of a stricture. There was small non reducible hiatal hernia. The stomach distended reasonably well. The rugal folds were mildly prominent. No ulcer niche was demonstrated. No bezoar was demonstrated. Gastric emptying was prompt. The duodenal bulb and C sweep were normally positioned. Barium moved into the proximal jejunum within 10 min of ingestion of barium preparations.  Over the course of 3 hr and 35 min the barium traversed the small bowel. The jejunum demonstrated a normal feathery mucosal pattern. The ileum demonstrated a normal pattern as well. The terminal ileum was observed fluoroscopically and was normal. The patient voiced no focal tenderness to palpation over the abdomen.  IMPRESSION: 1. No residual bezoar is demonstrated. There is a small hiatal hernia and the rugal folds of the stomach are mildly prominent. Gastric emptying is normal. 2. The duodenum, jejunum, and ileum demonstrate no acute abnormality. There is no evidence of obstruction.   Electronically Signed   By: David  Martinique   On: 03/25/2014 15:47   Assessment / Plan: -80 y.o. female with incomplete, partial gastric outlet obstruction with tortuous distal stomach seen on EGD 12/17. This was the same problem she had many years ago that led to her original gastro-jejunostomy. Recent bezoar, difficultly with gastric secretions, however, UGI/SBFT is normal.  Difficult situation. Pt and family do not  want any further surgeries.  She has been receiving Reglan 10 mg IV TID, perhaps this will relieve some of the problem; would continue this. Would not plan to advance her diet past full liquids on which she can maintain her nutrition. If she proves she cannot tolerate full liquids with reglan, then would consider venting PEG, perhaps PEG-J to allow  nutritional support. Could consider duodenal stent, although that would be controversial in benign disease.  Should have follow-up with nutritional services. -Malignant HTN/hypertensive urgency -Hyponatremia -Recent slurred speech: Seen by neuro and CT ok. MRI head ok. No sign of CVA or other acute issues.  *Will sign off from GI standpoint.  Call prn with questions.    LOS: 14 days    No obstruction by upper GI series.  I would switch to by mouth Reglan and try a soft diet again.  Failing that, she can  be maintained on a full liquid diet with supplements. ZEHR, JESSICA D.  03/26/2014, 9:12 AM  Pager number [phone]

## 2014-03-26 NOTE — Progress Notes (Signed)
Report called to Timmothy Sours at [facility]. Hospital course and discharge reviewed. All questions answered. Callie Fielding RN

## 2014-03-26 NOTE — Progress Notes (Signed)
Informed by RN at [facility] that IV meds can not be dispensed IV so we switched all to PO meds which is {P metoprolol, PO synthroid and PO hydralazine. New AVS and D/C summary to be faxed there.  Fax# [phone]  Leisa Lenz [phone]

## 2014-03-26 NOTE — Progress Notes (Signed)
Patient is set to discharge to [facility] today. Patient & daughter, [name] aware. Discharge packet given to RN, Robert Bellow. [facility] called for transport to pickup at 12:30.   Clinical Social Work Department CLINICAL SOCIAL WORK PLACEMENT NOTE 03/26/2014  Patient:  Shannon [name], Shannon [name]  Account Number:  [id number] Admit date:  03/12/2014  Clinical Social Worker:  Renold Genta  Date/time:  03/26/2014 11:12 AM  Clinical Social Work is seeking post-discharge placement for this patient at the following level of care:   SKILLED NURSING   (*CSW will update this form in Epic as items are completed)   03/26/2014  Patient/family provided with [place] Department of Clinical Social Work's list of facilities offering this level of care within the geographic area requested by the patient (or if unable, by the patient's family).  03/26/2014  Patient/family informed of their freedom to choose among providers that offer the needed level of care, that participate in Medicare, Medicaid or managed care program needed by the patient, have an available bed and are willing to accept the patient.  03/26/2014  Patient/family informed of [facility]' ownership interest in [facility], as well as of the fact that they are under no obligation to receive care at this facility.  PASARR submitted to EDS on 03/26/2014 PASARR number received on 03/26/2014  FL2 transmitted to all facilities in geographic area requested by pt/family on  03/26/2014 FL2 transmitted to all facilities within larger geographic area on 03/26/2014  Patient informed that his/her managed care company has contracts with or will negotiate with  certain facilities, including the following:     Patient/family informed of bed offers received:  03/26/2014 Patient chooses bed at [facility]ician recommends and patient chooses bed at    Patient to be transferred to [place] on  03/26/2014 Patient to be transferred to  facility by [facility] Patient and family notified of transfer on 03/26/2014 Name of family member notified:  patient's daughter, [name] via phone  The following physician request were entered in Epic:   Additional Comments:    Raynaldo Opitz, [place] Worker cell #: [phone]

## 2014-03-26 NOTE — Evaluation (Signed)
Physical Therapy Evaluation Patient Details Name: Shannon [name] MRN: [id number] DOB: 1923-07-31 Today's Date: 03/26/2014   History of Present Illness   Shannon [name] is a 80 y.o. 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 Patient states following which patient's symptoms largely improved. But over the last few weeks patient's abdominal pain started to recur and worsen. CT abdomen and pelvis done shows partial gastric outlet obstruction with recurrence of the Bezoar. At this time patient has been placed on NG tube suction and admitted for further workup.   Clinical Impression  **Pt ambulated 44' with RW with supervision, no LOB. Pt has general deconditioning and would benefit from RW and PT at SNF to progress to independence with mobility. *    Follow Up Recommendations SNF;Supervision for mobility/OOB    Equipment Recommendations  Rolling walker with 5" wheels    Recommendations for Other Services       Precautions / Restrictions Precautions Precautions: Fall Restrictions Weight Bearing Restrictions: No      Mobility  Bed Mobility Overal bed mobility: Needs Assistance Bed Mobility: Supine to Sit     Supine to sit: HOB elevated;Supervision     General bed mobility comments: cues for technique  Transfers Overall transfer level: Needs assistance Equipment used: Rolling walker (2 wheeled) Transfers: Sit to/from [employer] Sit to Stand: Min assist Stand pivot transfers: Min assist       General transfer comment: assist to rise and to steady  Ambulation/Gait Ambulation/Gait assistance: Min guard Ambulation Distance (Feet): 70 Feet Assistive device: Rolling walker (2 wheeled) Gait Pattern/deviations: Decreased stride length   Gait velocity interpretation: at or above normal speed for age/gender General Gait Details: steady with RW, no LOB, cues  to lift head due to forward head posture  Stairs            Wheelchair Mobility    Modified Rankin (Stroke Patients Only)       Balance Overall balance assessment: Needs assistance   Sitting balance-Leahy Scale: Good       Standing balance-Leahy Scale: Fair                               Pertinent Vitals/Pain Pain Assessment: No/denies pain    Home Living Family/patient expects to be discharged to:: Private residence Living Arrangements: Children Available Help at Discharge: Family;Available 24 hours/day Type of Home: House Home Access: Ramped entrance     Home Layout: One level Home Equipment: Walker - 2 wheels;Cane - single point;Shower seat Additional Comments: tub shower    Prior Function Level of Independence: Independent with assistive device(s)               Hand Dominance   Dominant Hand: Right    Extremity/Trunk Assessment   Upper Extremity Assessment: Defer to OT evaluation           Lower Extremity Assessment: Overall WFL for tasks assessed;RLE deficits/detail;LLE deficits/detail      Cervical / Trunk Assessment: Kyphotic  Communication   Communication: No difficulties  Cognition Arousal/Alertness: Awake/alert Behavior During Therapy: WFL for tasks assessed/performed Overall Cognitive Status: Within Functional Limits for tasks assessed                      General Comments      Exercises        Assessment/Plan  PT Assessment All further PT needs can be met in the next venue of care  PT Diagnosis Generalized weakness   PT Problem List Decreased activity tolerance  PT Treatment Interventions     PT Goals (Current goals can be found in the Care Plan section)      Frequency     Barriers to discharge        Co-evaluation PT/OT/SLP Co-Evaluation/Treatment: Yes Reason for Co-Treatment: For patient/therapist safety PT goals addressed during session: Mobility/safety with mobility          End of Session Equipment Utilized During Treatment: Gait belt Activity Tolerance: Patient tolerated treatment well Patient left: in chair;with call bell/phone within reach;with chair alarm set Nurse Communication: Mobility status         Time: 1914-7829 PT Time Calculation (min) (ACUTE ONLY): 34 min   Charges:   PT Evaluation $Initial PT Evaluation Tier I: 1 Procedure PT Treatments $Gait Training: 8-22 mins   PT G Codes:        Tamala Ser 03/26/2014, 12:04 PM [phone]

## 2014-03-26 NOTE — Discharge Instructions (Signed)

## 2014-03-26 NOTE — Progress Notes (Signed)
NUTRITION FOLLOW UP  Intervention:   -Diet advancement per MD; Continue Reglan -Continue Boost Plus BID daily, each provides 360 kcal and 14g of protein. -If nutrition support warranted: Recommend Initiate Vital 1.5 @ 10 ml/hr and increase by 10 ml every 4 hours to goal rate of 50 ml/hr.  -Tube feeding regimen provides 1800kcal (100% of needs), 81 grams of protein, and 917 ml of H2O.  -RD to continue to monitor  Nutrition Dx:   Inadequate oral intake related to inability to eat as evidenced by NPO; ongoing now r/t to full liquid diet  Goal:   Pt to meet >/= 90% of their estimated nutrition needs, progressing   Monitor:   Total protein/energy intake, labs, weights, GI profile  Assessment:   12/11: - Pt reports a 7 lb wt loss in the past month. She says that her usual body weight is 150 lbs. Pt follows a pureed diet at home and drinks Boost. Today she has tolerated broth, jello, juice, and ginger ale. Current diet is sips of clears.  - Pt with moderate wasting at the temples.   12/18: -NG placed for decompression; was removed on 12/15 -Diet advanced to clear on 12/15 -Pt tolerating w/out nausea or vomiting. Consuming > 75% of meals Assisted with meal ordering -MD noted plan to advanced to full liquids w/addition of reglan. Pt eager for diet advancement -Will continue to recommend Boost Plus BID w/advancement -Wt +3 lbs since admit  12/24 -RD consulted for nutritional assessment. -Per GI note, pt's diet is not advised to be advanced past full liquids. May need possible PEG placement, possible J-tube? RD to provide recommendations for nutrition support. -PO intake: 100% of broth and juice -Pt's weight is -10 lb since 12/18 -Pt is being discharged today.  Height: Ht Readings from Last 1 Encounters:  03/24/14 5\' 6"  (1.676 m)    Weight Status:   Wt Readings from Last 1 Encounters:  03/26/14 136 lb 14.5 oz (62.1 kg)  12/18  146 lb 12/11  143 lb  Re-estimated needs:  Kcal:  1625-1950 Protein: 85-95 g Fluid: 2.0 L/day  Skin: WDL  Diet Order: Diet clear liquid   Intake/Output Summary (Last 24 hours) at 03/26/14 0838 Last data filed at 03/26/14 0659  Gross per 24 hour  Intake 2858.33 ml  Output      0 ml  Net 2858.33 ml    Last BM: 12/20   Labs:   Recent Labs Lab 03/23/14 0425 03/24/14 0650 03/26/14 0409  NA 125* 126* 125*  K 4.6 4.4 4.3  CL 92* 95* 97  CO2 21 23 23   BUN 5* 6 8  CREATININE 0.94 0.85 0.80  CALCIUM 9.4 9.1 9.1  GLUCOSE 130* 135* 142*    CBG (last 3)  No results for input(s): GLUCAP in the last 72 hours.  Scheduled Meds: . amLODipine  10 mg Oral Daily  . antiseptic oral rinse  7 mL Mouth Rinse q12n4p  . aspirin  300 mg Rectal Daily  . chlorhexidine  15 mL Mouth Rinse BID  . cloNIDine  0.3 mg Transdermal Weekly  . cloNIDine  0.2 mg Oral TID  . cycloSPORINE  1 drop Both Eyes BID  . hydrALAZINE  10 mg Intravenous 4 times per day  . labetalol  20 mg Intravenous Q6H  . lactose free nutrition  237 mL Oral BID BM  . latanoprost  1 drop Right Eye QHS  . levothyroxine  100 mcg Intravenous Daily  . metoCLOPramide (REGLAN) injection  10 mg Intravenous 3 times per day  . nitroGLYCERIN  0.2 mg Transdermal Daily  . pantoprazole (PROTONIX) IV  40 mg Intravenous Q12H    Continuous Infusions: . nitroGLYCERIN Stopped (03/22/14 1600)  . sodium chloride 0.9 % 1,000 mL with potassium chloride 20 mEq infusion 100 mL/hr at 03/25/14 2359    Clayton Bibles, MS, RD, LDN Pager: [phone] After Hours Pager: [phone]

## 2014-03-26 NOTE — Discharge Summary (Addendum)
Physician Discharge Summary  Farhana Koglin WUJ:[id number] DOB: [birth year] DOA: 03/12/2014  PCP: Oneal Grout, MD  Admit date: 03/12/2014 Discharge date: 03/26/2014  Recommendations for Outpatient Follow-up:  1. Check CBC and BMP in LTAC per LTAC protocol. 2. Please note hemoglobin is stable at 10.3 prior to discharge. His sodium is 125, stable. 3. Please note extensive regimen for blood pressure control. Patient has had a difficult time controlling blood pressure and it is fluctuating throughout the day. Please use when necessary medications such as hydralazine to try to control blood pressure.  Discharge Diagnoses:  Active Problems:   Gastric outlet obstruction   Hypothyroidism   CKD (chronic kidney disease) stage 3, GFR 30-59 ml/min   Accelerated hypertension   Partial gastric outlet obstruction   Abdominal pain   Gastroparesis   Abdominal distention   Chest pain   Acute encephalopathy   Slurred speech   Hyponatremia    Discharge Condition: stable   Diet recommendation: as tolerated   History of present illness:  80 y.o. female with remote gastro-jejunostomy for peptic ulcer disease. She unfortunately had a perforation of an anastomotic ulcer at the gastro-jejunal anastomosis in June 2015 and underwent surgery at [facility].Patient was hospitalized here in October 2015 with abdominal pain nausea and vomiting and had endoscopy done twice during that admission with finding of a gastric bezoar.She was discharged home on a pured diet and PPI therapy.She unfortunately present on 12/10 with abd pain/vomiting, CT scan of abdomen showed persistent marked thickening of the pyloric channel and prepyloric antrum and a large filling defect in the stomach measuring 12 x 10 x 15 cm consistent with a bezoar.She was admitted for further evaluation and treatment. GI and Gen Surgery have been consulted. Course complicated with hypertensive urgency and patient has required NTG drip. On  03/22/2014, the patient became encephalopathic and had a transient episode of slurred speech. She had no other focal deficits. Neurology was consulted and recommended MRI of the brain. MRI brain negative for acute stroke. As the patient's family does not want any further surgical intervention, general surgery has signed off. The pt's anti-HTN regimen was adjusted and she was weaned off of the NTG drip x 24 hrs and transferred to the medical floor on 03/24/14.  Assessment & Plan   Gastric outlet obstruction -Initially there was question of perforation at the GE junction on CT scan however barium esophagram ruled this out. -Patient did have NG tube in place but was removed on 03/17/2014 -EGD conducted on 03/19/2014 showed incomplete gastric outlet obstruction from distal tortuous/anatomically abnormal distal stomach from surgery (gastrojejunostomy) -Gastroenterology, general surgery consulted -Gen. surgery signed off as patient's family did not want further surgery  Hypertensive urgency -Patient did require nitroglycerin drip and was placed in stepdown however was able to be weaned off -Patient was placed on scheduled metoprolol, hydralazine, clonidine, amlodipine suspension and nitroglycerin patch.  Acute encephalopathy -Patient appears to be back at baseline -Thought to be multifactorial including hyponatremia and hypertensive encephalopathy, progressive UTI, cerebral ischemia -Neurology consulted and appreciated, recommended MRI, if no acute abnormalities, no further workup -MRI of the brain without contrast showed no acute changes -TSH 27.66, Synthroid dose was increased to 100 g IV daily -B12: >2000, Folate >1000 -Urinalysis no infection -Opioids and hypnotics were discontinued  Hyponatremia -Possibly secondary to poor oral intake, volume depletion, component of SIADH according to serum and urine osmolarity -Continue to monitor BMP  Remote history of gastrojejunostomy  -with  perforation of anastomotic ulcer June 2015  status post revision and laparotomy -General surgery was consulted however per family no further surgeries wanted -Small bowel follow through - no residual bezoar noted, no evidence of obstruction  -Clear liquid diet  -Flatus and having small bowel movement  Atypical chest pain -Resolved -EKG showed nonspecific changes -Possibly secondary to hypertensive urgency versus GERD -Troponins were negative 3  Diabetes mellitus, type II -12/24/2013, hemoglobin A1c 6.9 -Continue to monitor CBGs  Chronic kidney disease, stage III -Based and creatinine appears to be 0.9-1.3 -Continue to monitor BMP  Hypokalemia -Continue to replete as needed -Magnesium within normal limits  Hypothyroidism -Synthroid increased to 20 g daily -TSH 27.660  History CVA -Continue aspirin suppositories, no new deficits noted -Neurology consulted and appreciated  Depression -May resume sertraline   DVT Prophylaxis  - SCDs  Code Status: Full Family Communication: none at bedside, daughter via phone    Procedures  EGD  Consults  General surgery Gastroenterology Neurology   Signed:  Manson Passey, MD  [facility] 03/26/2014, 10:48 AM  Pager #: [phone]   Discharge Exam: Filed Vitals:   03/26/14 0933  BP: 213/71  Pulse: 66  Temp:   Resp:    Filed Vitals:   03/26/14 0300 03/26/14 0514 03/26/14 0537 03/26/14 0933  BP: 200/82 182/68 178/82 213/71  Pulse: 74 62  66  Temp: 98.1 F (36.7 C) 98.7 F (37.1 C)    TempSrc: Oral Oral    Resp: 20 20    Height:      Weight:  62.1 kg (136 lb 14.5 oz)    SpO2: 99% 98%  100%    General: Pt is alert, not in acute distress Cardiovascular: Regular rate and rhythm, S1/S2 appreciated Respiratory: no wheezing, no crackles, no rhonchi Abdominal: Soft, non tender, non distended, bowel sounds +, no guarding Extremities: no cyanosis, pulses palpable bilaterally DP and PT Neuro: Grossly  nonfocal  Discharge Instructions  Discharge Instructions    Call MD for:  difficulty breathing, headache or visual disturbances    Complete by:  As directed      Call MD for:  persistant dizziness or light-headedness    Complete by:  As directed      Call MD for:  persistant nausea and vomiting    Complete by:  As directed      Call MD for:  redness, tenderness, or signs of infection (pain, swelling, redness, odor or green/yellow discharge around incision site)    Complete by:  As directed      Call MD for:  severe uncontrolled pain    Complete by:  As directed      Diet - low sodium heart healthy    Complete by:  As directed      Increase activity slowly    Complete by:  As directed             Medication List    STOP taking these medications        CALTRATE 600 PO      TAKE these medications        amLODipine 1 mg/mL Susp oral suspension  Commonly known as:  NORVASC  Take 10 mLs (10 mg total) by mouth daily.     antiseptic oral rinse 0.05 % Liqd solution  Commonly known as:  CPC / CETYLPYRIDINIUM CHLORIDE 0.05%  7 mLs by Mouth Rinse route 2 times daily at 12 noon and 4 pm.     aspirin EC 81 MG tablet  Take 81 mg by mouth  every morning.     atorvastatin 10 MG tablet  Commonly known as:  LIPITOR  Take 10 mg by mouth daily at 6 PM.     B-complex with vitamin C tablet  Take 1 tablet by mouth 2 (two) times daily.     calcium-vitamin D 500-200 MG-UNIT per tablet  Commonly known as:  OSCAL WITH D  Take 1 tablet by mouth 2 (two) times daily.     chlorhexidine 0.12 % solution  Commonly known as:  PERIDEX  15 mLs by Mouth Rinse route 2 (two) times daily.     cloNIDine 0.3 mg/24hr patch  Commonly known as:  CATAPRES - Dosed in mg/24 hr  Place 1 patch (0.3 mg total) onto the skin once a week.     cloNIDine 10 mcg/mL Susp  Commonly known as:  CATAPRES  Take 20 mLs (0.2 mg total) by mouth 3 (three) times daily.     cycloSPORINE 0.05 % ophthalmic emulsion   Commonly known as:  RESTASIS  Place 1 drop into both eyes 2 (two) times daily.     fluticasone 50 MCG/ACT nasal spray  Commonly known as:  FLONASE  Place 2 sprays into both nostrils daily.     gabapentin 300 MG capsule  Commonly known as:  NEURONTIN  Take 300 mg by mouth 3 (three) times daily.     hydrALAZINE 100 MG tablet  Commonly known as:  APRESOLINE  Take 100 mg by mouth 3 (three) times daily.     isosorbide mononitrate 60 MG 24 hr tablet  Commonly known as:  IMDUR  Take 60 mg by mouth every morning.     l-methylfolate-B6-B12 3-35-2 MG Tabs  Commonly known as:  METANX  Take 1 tablet by mouth every morning.     lactose free nutrition Liqd  Take 237 mLs by mouth 2 (two) times daily between meals.     latanoprost 0.005 % ophthalmic solution  Commonly known as:  XALATAN  Place 1 drop into the right eye at bedtime.     levothyroxine 200 MCG tablet  Commonly known as:  SYNTHROID  Take 1 tablet (200 mcg total) by mouth daily before breakfast.     metoprolol succinate 50 MG 24 hr tablet  Commonly known as:  TOPROL XL  Take 1 tablet (50 mg total) by mouth daily. Take with or immediately following a meal.     multivitamin with minerals tablet  Take 1 tablet by mouth every morning.     nitroGLYCERIN 0.2 mg/hr patch  Commonly known as:  NITRODUR - Dosed in mg/24 hr  Place 1 patch (0.2 mg total) onto the skin daily.     ondansetron 4 MG tablet  Commonly known as:  ZOFRAN  Take 1 tablet (4 mg total) by mouth every 6 (six) hours as needed for nausea.     pantoprazole 40 MG tablet  Commonly known as:  PROTONIX  Take 1 tablet (40 mg total) by mouth 2 (two) times daily.     polyethylene glycol packet  Commonly known as:  MIRALAX / GLYCOLAX  Take 17 g by mouth daily as needed for mild constipation.     polyethylene glycol packet  Commonly known as:  MIRALAX / GLYCOLAX  Take 17 g by mouth daily as needed for mild constipation.     polyvinyl alcohol 1.4 % ophthalmic  solution  Commonly known as:  LIQUIFILM TEARS  Place 1 drop into both eyes as needed for dry eyes.  sennosides-docusate sodium 8.6-50 MG tablet  Commonly known as:  SENOKOT-S  Take 1 tablet by mouth 2 (two) times daily.     sertraline 100 MG tablet  Commonly known as:  ZOLOFT  Take 100 mg by mouth every morning.     sucralfate 1 G tablet  Commonly known as:  CARAFATE  Take 1 g by mouth 3 (three) times daily with meals.     traMADol 50 MG tablet  Commonly known as:  ULTRAM  Take 50 mg by mouth 2 (two) times daily as needed for moderate pain.     TYLENOL EX ST ARTHRITIS PAIN 500 MG tablet  Generic drug:  acetaminophen  Take 500 mg by mouth every 6 (six) hours as needed (for arthritis pain).     acetaminophen 160 MG/5ML solution  Commonly known as:  TYLENOL  Take 20.3 mLs (650 mg total) by mouth every 6 (six) hours as needed for mild pain or fever (>=101).     VASCULERA Tabs  Take 1 tablet by mouth daily.            Follow-up Information    Follow up with Oneal Grout, MD. Schedule an appointment as soon as possible for a visit in 2 weeks.   Specialty:  Internal Medicine   Why:  Follow up appt after recent hospitalization   Contact information:   [address] [place] [address] [phone]        The results of significant diagnostics from this hospitalization (including imaging, microbiology, ancillary and laboratory) are listed below for reference.    Significant Diagnostic Studies: Ct Abdomen Pelvis Wo Contrast  03/12/2014   CLINICAL DATA:  Constant vomiting today, constipated since Saturday, RIGHT lower quadrant rectal pain, history diabetes, hypertension, prior gastric bezoar  EXAM: CT ABDOMEN AND PELVIS WITHOUT CONTRAST  TECHNIQUE: Multidetector CT imaging of the abdomen and pelvis was performed following the standard protocol without IV contrast. Sagittal and coronal MPR images reconstructed from axial data set.  COMPARISON:  03/07/2014  FINDINGS:  Minimal bibasilar atelectasis.  Low-attenuation foci within liver, question cysts, unchanged.  Metallic foreign body at LEFT ventricle again identified, uncertain etiology.  Gallbladder surgically absent.  Probable tiny LEFT renal cyst 9 mm diameter.  Within limits of a nonenhanced exam, remainder of liver, spleen, pancreas, kidneys and adrenal glands normal appearance.  Persistent marked thickening of the pyloric channel and pre pyloric antrum gastric distention question partial gastric outlet obstruction.  Small amount of ingested contrast is present within proximal small bowel loops.  Large filling defect within the stomach, 12.2 x 10.0 x 15.2 cm, containing bubbly gas, consistent with recurrent bezoar.  Scattered colonic diverticula.  No evidence of bowel obstruction or dilatation.  Extensive atherosclerotic calcifications aorta and minimally in coronary arteries.  High attenuation nodule 13 mm diameter again identified adjacent to the thickened gastroesophageal junction, unchanged but since 03/07/2014 but new since 01/17/2014, question contrast within epiphrenic diverticulum or small focal contained perforation, unlikely calcified lymph node since this is new in past 2 months.  Unremarkable bladder, ureters, uterus and adnexae.  Appendix not visualized.  No definite rectal wall thickening.  No additional mass, adenopathy, free fluid, free air or abscess.  Bones demineralized with scattered degenerative changes.  Surgical scar at ventral abdomen with a small incisional hernia containing a portion of transverse colon.  IMPRESSION: Thickened pylorus and pre-pyloric antrum likely causing partial outlet obstruction.  Large filling defect within stomach consistent with recurrent bezoar 12.2 x 10.0 x 15.2 cm.  Colonic diverticulosis.  Suspected hepatic cysts unchanged.  13 mm high attenuation nodule adjacent to thickened gastroesophageal junction, question contained perforation versus epiphrenic diverticulum  containing contrast, new since 01/17/2014 but unchanged since 03/07/2014.  Small incisional hernia containing a portion of the transverse colon wall without obstruction.   Electronically Signed   By: Ulyses Southward M.D.   On: 03/12/2014 23:12   Ct Head Wo Contrast  03/22/2014   CLINICAL DATA:  Acute onset of slurred speech.  Initial encounter.  EXAM: CT HEAD WITHOUT CONTRAST  TECHNIQUE: Contiguous axial images were obtained from the base of the skull through the vertex without intravenous contrast.  COMPARISON:  CT of the head performed 12/23/2013, and MRI of the brain performed 12/24/2013  FINDINGS: There is no evidence of acute infarction, mass lesion, or intra- or extra-axial hemorrhage on CT.  Prominence of the ventricles and sulci reflects mild to moderate cortical volume loss. Cerebellar atrophy is noted. Scattered periventricular and subcortical white matter change likely reflects small vessel ischemic microangiopathy. A chronic infarct is noted at the left cerebellar hemisphere.  The brainstem and fourth ventricle are within normal limits. The basal ganglia are unremarkable in appearance. The cerebral hemispheres demonstrate grossly normal gray-white differentiation. No mass effect or midline shift is seen.  There is no evidence of fracture; visualized osseous structures are unremarkable in appearance. The visualized portions of the orbits are within normal limits. The paranasal sinuses and mastoid air cells are well-aerated. No significant soft tissue abnormalities are seen.  IMPRESSION: 1. No acute intracranial pathology seen on CT. 2. Mild to moderate cortical volume loss and scattered small vessel ischemic microangiopathy. 3. 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   Electronically Signed   By: Roanna Raider M.D.   On: 03/22/2014 03:30   Mr Brain Wo Contrast  03/23/2014   CLINICAL DATA:  Acute onset of slurred speech. Previous history of CVA.  EXAM: MRI HEAD WITHOUT CONTRAST  TECHNIQUE:  Multiplanar, multiecho pulse sequences of the brain and surrounding structures were obtained without intravenous contrast.  COMPARISON:  MR brain 12/24/2013.  CT head 03/22/2014.  FINDINGS: No evidence for acute infarction, hemorrhage, mass lesion, hydrocephalus, or extra-axial fluid. Generalized cerebral and cerebellar atrophy. Extensive T2 and FLAIR hyperintensities throughout the periventricular and subcortical white matter, consistent with small vessel disease. Flow voids are maintained throughout the carotid, basilar, and vertebral arteries. There are no areas of chronic hemorrhage. Pituitary, pineal, and cerebellar tonsils unremarkable. No upper cervical lesions. Mild pannus, with mild cervical spondylosis. Chronic LEFT cerebellar infarct. Chronic BILATERAL thalamic infarcts. Tiny chronic microhemorrhages in the brain, better appreciated on the previous 3T scan at [facility]. No acute sinus or mastoid disease. Negative orbits. Scalp soft tissues unremarkable.  IMPRESSION: Chronic changes as described. No acute intracranial abnormality. Similar appearance to prior MR.   Electronically Signed   By: Davonna Belling M.D.   On: 03/23/2014 16:22   Ct Abdomen Pelvis W Contrast  03/07/2014   CLINICAL DATA:  80 year old female presenting with rectal pressure gradually worsening over the prior month.  EXAM: CT ABDOMEN AND PELVIS WITH CONTRAST  TECHNIQUE: Multidetector CT imaging of the abdomen and pelvis was performed using the standard protocol following bolus administration of intravenous contrast.  CONTRAST:  50mL OMNIPAQUE IOHEXOL 300 MG/ML SOLN, 80mL OMNIPAQUE IOHEXOL 300 MG/ML SOLN  COMPARISON:  CT of the abdomen and pelvis 01/17/2014.  FINDINGS: Lower chest: Cardiomegaly with biatrial dilatation. Calcifications of the aortic valve. Atherosclerotic calcifications in the left main and left  circumflex coronary arteries. Small hiatal hernia.  Hepatobiliary: Multifocal low-attenuation lesions appear unchanged in  size, number and distribution scattered throughout the liver. Many these are too small to definitively characterize, while the larger lesions are intermediate attenuation, favored to represent proteinaceous cysts. The largest of these lesions measures only 12 mm in segment 2 (image 21 of series 2). No new hepatic lesions are noted. No intra or extrahepatic biliary ductal dilatation. Status post cholecystectomy.  Pancreas: Unremarkable.  Spleen: Unremarkable.  Adrenals/Urinary Tract: Again noted is a 1.2 cm intermediate attenuation (23 HU) lesion in the lower pole of the left kidney, with some surrounding architectural distortion, favored to represent a previously ruptured proteinaceous cyst. No suspicious renal lesions are noted. Multifocal cortical thinning in the kidneys bilaterally, presumably mild scarring. No hydroureteronephrosis. Urinary bladder is normal in appearance. Bilateral adrenal glands are normal in appearance.  Stomach/Bowel: As with the prior examination there is extensive thickening of the gastric wall in the antral pre-pyloric region of the stomach. The pylorus is also markedly thickened (up to 15 mm in thickness). The stomach is moderately distended. No pathologic dilatation of small bowel or colon. Postoperative changes of partial colectomy near the rectosigmoid junction are noted. A few scattered colonic diverticulae are noted, without surrounding inflammatory changes to suggest an acute diverticulitis at this time. The distal rectum and anus are grossly unremarkable in appearance.  Vascular/Lymphatic: Extensive atherosclerosis throughout the abdominal and pelvic vasculature, without evidence of aneurysm or dissection. No lymphadenopathy noted in the abdomen or pelvis.  Reproductive: Retroverted uterus.  Ovaries are atrophic.  Other: No significant volume of ascites.  No pneumoperitoneum.  Musculoskeletal: Tiny ventral hernia, through which there is incomplete herniation of the mid transverse  colon; no findings to suggest associated bowel incarceration or obstruction at this time. Old compression fracture of superior endplate of T11 with approximately 20% loss of anterior vertebral body height (unchanged). There are no aggressive appearing lytic or blastic lesions noted in the visualized portions of the skeleton.  IMPRESSION: 1. No findings to account for the patient's worsening rectal pressure. 2. There is persistent thickening of the pylorus and antral pre-pyloric region of the stomach, which could be inflammatory in the setting of ulcer disease, or could be neoplastic. There is persistent moderate dilatation of the stomach, suggesting some degree of gastric outlet obstruction. Correlation with results of recent upper endoscopy is suggested. 3. Additional incidental findings, as above, similar to prior examination.   Electronically Signed   By: Trudie Reed M.D.   On: 03/07/2014 16:28   Dg Chest Port 1 View  03/14/2014   CLINICAL DATA:  80 year old female with new onset chest pain beginning today  EXAM: PORTABLE CHEST - 1 VIEW  COMPARISON:  None.  FINDINGS: Cardiomegaly. Atherosclerotic calcification present in the transverse aorta. No focal airspace consolidation, pulmonary edema, pneumothorax. a query trace left pleural effusion. No acute osseous abnormality. The left glenohumeral joint osteoarthritis. Mild coarsening of the interstitial markings.  IMPRESSION: 1. Query trace left pleural effusion. 2. Cardiomegaly. 3. Aortic atherosclerosis.   Electronically Signed   By: Malachy Moan M.D.   On: 03/14/2014 10:12   Dg Abd Portable 1v  03/15/2014   CLINICAL DATA:  Nasogastric tube placement.  EXAM: PORTABLE ABDOMEN - 1 VIEW  COMPARISON:  03/14/2014  FINDINGS: The nasogastric tube is in place, tip coiled within the stomach and tip overlying the region of the fundus. Surgical clips are noted in the right upper quadrant. There is residual contrast in the colon and  distal small bowel  following esophagram on 03/13/2014. Bowel gas pattern is nonobstructive.  IMPRESSION: Nasogastric tube coiled within the stomach.   Electronically Signed   By: Rosalie Gums M.D.   On: 03/15/2014 10:49   Dg Abd Portable 2v  03/14/2014   CLINICAL DATA:  80 year old female with persistent abdominal pain and fullness.  EXAM: PORTABLE ABDOMEN - 2 VIEW  COMPARISON:  Esophagram performed yesterday. CT abdomen/ pelvis 03/12/2014  FINDINGS: No evidence of free air on the lateral decubitus view. Contrast material is noted within the stomach and scattered throughout the large bowel. There is no evidence of obstruction. The volume of contrast material remaining within the stomach is greater than expected. The bones are diffusely osteopenic. Surgical clips in the right upper quadrant suggest prior cholecystectomy.  IMPRESSION: 1. No evidence of obstruction or free air. 2. Greater than expected volume of retained oral contrast within the stomach. Query clinical history of delayed gastric emptying or gastroparesis? 3. The bones appear osteopenic.   Electronically Signed   By: Malachy Moan M.D.   On: 03/14/2014 10:04   Dg Ugi W/small Bowel  03/25/2014   CLINICAL DATA:  Nausea and vomiting; history of bezoar.  EXAM: UPPER GI SERIES WITH SMALL BOWEL FOLLOW-THROUGH using thin barium.  FLUOROSCOPY TIME:  1 min, 17 seconds  TECHNIQUE: Combined double contrast and single contrast upper GI series using effervescent crystals, thick barium, and thin barium. Subsequently, serial images of the small bowel were obtained including spot views of the terminal ileum.  COMPARISON:  Abdominal film of March 15, 2014.  FINDINGS: Scout films from 22 December and today were reviewed. A moderate stool burden in the right colon was noted. Scattered diverticula containing radiodense contrast were noted in the left colon.  The patient ingested the barium without difficulty. The thoracic esophagus demonstrated mild changes of presbyesophagus  but no evidence of a stricture. There was small non reducible hiatal hernia. The stomach distended reasonably well. The rugal folds were mildly prominent. No ulcer niche was demonstrated. No bezoar was demonstrated. Gastric emptying was prompt. The duodenal bulb and C sweep were normally positioned. Barium moved into the proximal jejunum within 10 min of ingestion of barium preparations.  Over the course of 3 hr and 35 min the barium traversed the small bowel. The jejunum demonstrated a normal feathery mucosal pattern. The ileum demonstrated a normal pattern as well. The terminal ileum was observed fluoroscopically and was normal. The patient voiced no focal tenderness to palpation over the abdomen.  IMPRESSION: 1. No residual bezoar is demonstrated. There is a small hiatal hernia and the rugal folds of the stomach are mildly prominent. Gastric emptying is normal. 2. The duodenum, jejunum, and ileum demonstrate no acute abnormality. There is no evidence of obstruction.   Electronically Signed   By: David  [place]   On: 03/25/2014 15:47   Dg Esophagus W/water Sol Cm  03/13/2014   CLINICAL DATA:  Abnormal CT, evaluate for esophageal perforation  EXAM: ESOPHOGRAM/BARIUM SWALLOW  TECHNIQUE: Single contrast examination was performed using water-soluble contrast.  FLUOROSCOPY TIME:  1 min 7 seconds  COMPARISON:  CT abdomen pelvis dated 03/12/2014 and 03/07/2014  FINDINGS: Esophageal dysmotility.  No fixed esophageal narrowing/stricture.  Suspected small paraesophageal hernia (less likely hiatal hernia or esophageal diverticulum) at the GE junction. The appearance does not suggest a contained esophageal perforation.  Presence of gastroesophageal reflux could not be assessed due to the patient's inability to clear her esophagus.  Moderate food/debris in the stomach.  IMPRESSION: Esophageal dysmotility.  No fixed esophageal narrowing/stricture.  Suspected small paraesophageal hernia at the GE junction. No evidence of  contained esophageal perforation.  Moderate food/debris in the stomach.   Electronically Signed   By: Charline Bills M.D.   On: 03/13/2014 09:44    Microbiology: Recent Results (from the past 240 hour(s))  MRSA PCR Screening     Status: None   Collection Time: 03/16/14 12:36 PM  Result Value Ref Range Status   MRSA by PCR NEGATIVE NEGATIVE Final  Urine culture     Status: None   Collection Time: 03/22/14 10:00 AM  Result Value Ref Range Status   Specimen Description URINE, CATHETERIZED  Final   Special Requests NONE  Final   Culture  Setup Time   Final    03/22/2014 16:51 Performed at [facility]    Colony Count NO GROWTH Performed at [facility]   Final   Culture NO GROWTH Performed at [facility]   Final   Report Status 03/23/2014 FINAL  Final     Labs: Basic Metabolic Panel:  Recent Labs Lab 03/22/14 0339 03/23/14 0425 03/24/14 0650 03/26/14 0409  NA 127* 125* 126* 125*  K 4.4 4.6 4.4 4.3  CL 92* 92* 95* 97  CO2 21 21 23 23   GLUCOSE 128* 130* 135* 142*  BUN 4* 5* 6 8  CREATININE 0.95 0.94 0.85 0.80  CALCIUM 9.7 9.4 9.1 9.1   Liver Function Tests: No results for input(s): AST, ALT, ALKPHOS, BILITOT, PROT, ALBUMIN in the last 168 hours. No results for input(s): LIPASE, AMYLASE in the last 168 hours. No results for input(s): AMMONIA in the last 168 hours. CBC:  Recent Labs Lab 03/21/14 0528 03/22/14 0339 03/23/14 0425 03/24/14 0650 03/26/14 0409  WBC 4.4 4.7 4.0 5.9 4.7  HGB 8.8* 9.9* 10.2* 10.2* 10.3*  HCT 25.0* 28.9* 29.0* 29.4* 29.4*  MCV 88.3 87.6 87.3 87.2 86.7  PLT 303 341 373 321 317   Cardiac Enzymes: No results for input(s): CKTOTAL, CKMB, CKMBINDEX, TROPONINI in the last 168 hours. BNP: BNP (last 3 results)  Recent Labs  01/17/14 2116  PROBNP 961.6*   CBG: No results for input(s): GLUCAP in the last 168 hours.  Time coordinating discharge: Over 30 minutes

## 2014-03-26 NOTE — Evaluation (Signed)
Occupational Therapy Evaluation Patient Details Name: Shannon [name] MRN: [id number] DOB: 07-12-1923 Today's Date: 03/26/2014    History of Present Illness  Shannon [name] is a 80 y.o. 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 Patient states following which patient's symptoms largely improved. But over the last few weeks patient's abdominal pain started to recur and worsen. CT abdomen and pelvis done shows partial gastric outlet obstruction with recurrence of the Bezoar. At this time patient has been placed on NG tube suction and admitted for further workup.    Clinical Impression   Pt overall able to transfer to [facility] and perform dressing task/hygiene with min assist. Plan is for SNF. Will follow to progress ADL independence and safety for next venue.    Follow Up Recommendations  SNF;Supervision/Assistance - 24 hour    Equipment Recommendations  3 in 1 bedside comode    Recommendations for Other Services       Precautions / Restrictions Precautions Precautions: Fall Restrictions Weight Bearing Restrictions: No      Mobility Bed Mobility Overal bed mobility: Needs Assistance Bed Mobility: Supine to Sit     Supine to sit: HOB elevated;Supervision     General bed mobility comments: cues for technique  Transfers Overall transfer level: Needs assistance Equipment used: Rolling walker (2 wheeled) Transfers: Sit to/from [employer] Sit to Stand: Min assist Stand pivot transfers: Min assist       General transfer comment: assist to rise and to steady    Balance Overall balance assessment: Needs assistance   Sitting balance-Leahy Scale: Good       Standing balance-Leahy Scale: Fair Standing balance comment: min assist for balance to pull up underwear                            ADL Overall ADL's : Needs  assistance/impaired Eating/Feeding: Independent;Sitting Eating/Feeding Details (indicate cue type and reason): thin liquids. Grooming: Wash/dry hands;Set up;Sitting   Upper Body Bathing: Set up;Sitting   Lower Body Bathing: Minimal assistance;Sit to/from stand   Upper Body Dressing : Set up;Sitting   Lower Body Dressing: Minimal assistance;Sit to/from stand   Toilet Transfer: Minimal assistance;Stand-pivot;BSC   Toileting- Clothing Manipulation and Hygiene: Minimal assistance;Sit to/from stand         General ADL Comments: Pt able to don own bra, shirt, underwear and pull her skirt over her head with min assist only for standing balance and min cues for orienting clothing the correct way for front and back. Pt supposed to d/c today. She tends to moblize with forward flexed posture.      Vision                     Perception     Praxis      Pertinent Vitals/Pain Pain Assessment: No/denies pain     Hand Dominance Right   Extremity/Trunk Assessment Upper Extremity Assessment Upper Extremity Assessment: Generalized weakness     Cervical / Trunk Assessment Cervical / Trunk Assessment: Kyphotic   Communication Communication Communication: No difficulties   Cognition Arousal/Alertness: Awake/alert Behavior During Therapy: WFL for tasks assessed/performed Overall Cognitive Status: Within Functional Limits for tasks assessed                     General Comments       Exercises  Shoulder Instructions      Home Living Family/patient expects to be discharged to:: Private residence Living Arrangements: Children Available Help at Discharge: Family;Available 24 hours/day Type of Home: House Home Access: Ramped entrance     Home Layout: One level     Bathroom Shower/Tub: Chief Strategy Officer: Standard     Home Equipment: Environmental consultant - 2 wheels;Cane - single point;Shower seat   Additional Comments: tub shower      Prior  Functioning/Environment Level of Independence: Independent with assistive device(s)        Comments: pt states she does her own bathing/dressing at daughters house. She states she has a 3in1 but it is at her home and she has been living with daughter    OT Diagnosis: Generalized weakness   OT Problem List: Decreased strength;Decreased knowledge of use of DME or AE   OT Treatment/Interventions: Self-care/ADL training;Patient/family education;Therapeutic activities;DME and/or AE instruction    OT Goals(Current goals can be found in the care plan section) Acute Rehab OT Goals Patient Stated Goal: wants to return to independence OT Goal Formulation: With patient Time For Goal Achievement: 04/09/14 Potential to Achieve Goals: Good  OT Frequency: Min 2X/week   Barriers to D/C:            Co-evaluation PT/OT/SLP Co-Evaluation/Treatment: Yes Reason for Co-Treatment: For patient/therapist safety PT goals addressed during session: Mobility/safety with mobility OT goals addressed during session: ADL's and self-care;Proper use of Adaptive equipment and DME      End of Session Equipment Utilized During Treatment: Rolling walker  Activity Tolerance: Patient tolerated treatment well Patient left: in chair;with call bell/phone within reach;with chair alarm set   Time: 5956-3875 OT Time Calculation (min): 48 min Charges:  OT General Charges $OT Visit: 1 Procedure OT Evaluation $Initial OT Evaluation Tier I: 1 Procedure OT Treatments $Self Care/Home Management : 8-22 mins $Therapeutic Activity: 8-22 mins G-Codes:    Lennox Laity  [phone] 03/26/2014, 1:02 PM

## 2014-03-26 NOTE — Progress Notes (Signed)
PT Cancellation Note  Patient Details Name: Shannon [name] MRN: [id number] DOB: [birth year]   Cancelled Treatment:    Reason Eval/Treat Not Completed: Medical issues which prohibited therapy (BP 213/70, RN aware, will re-attempt after pt receives BP meds at 10:00)   [name] 03/26/2014, 9:52 AM  [phone]

## 2014-03-27 ENCOUNTER — Emergency Department (HOSPITAL_COMMUNITY): Payer: Medicare Other

## 2014-03-27 ENCOUNTER — Inpatient Hospital Stay (HOSPITAL_COMMUNITY)
Admission: EM | Admit: 2014-03-27 | Discharge: 2014-03-30 | DRG: 313 | Disposition: A | Discharge status: Skilled Nursing Facility | Payer: Medicare Other | Attending: Family Medicine | Admitting: Family Medicine

## 2014-03-27 ENCOUNTER — Encounter (HOSPITAL_COMMUNITY): Payer: Self-pay | Admitting: Vascular Surgery

## 2014-03-27 DIAGNOSIS — R079 Chest pain, unspecified: Principal | ICD-10-CM | POA: Diagnosis present

## 2014-03-27 DIAGNOSIS — G629 Polyneuropathy, unspecified: Secondary | ICD-10-CM | POA: Diagnosis not present

## 2014-03-27 DIAGNOSIS — M47812 Spondylosis without myelopathy or radiculopathy, cervical region: Secondary | ICD-10-CM | POA: Diagnosis present

## 2014-03-27 DIAGNOSIS — I10 Essential (primary) hypertension: Secondary | ICD-10-CM | POA: Diagnosis present

## 2014-03-27 DIAGNOSIS — K449 Diaphragmatic hernia without obstruction or gangrene: Secondary | ICD-10-CM | POA: Diagnosis present

## 2014-03-27 DIAGNOSIS — H409 Unspecified glaucoma: Secondary | ICD-10-CM | POA: Diagnosis not present

## 2014-03-27 DIAGNOSIS — I639 Cerebral infarction, unspecified: Secondary | ICD-10-CM | POA: Diagnosis not present

## 2014-03-27 DIAGNOSIS — K5730 Diverticulosis of large intestine without perforation or abscess without bleeding: Secondary | ICD-10-CM | POA: Diagnosis present

## 2014-03-27 DIAGNOSIS — Z9049 Acquired absence of other specified parts of digestive tract: Secondary | ICD-10-CM | POA: Diagnosis present

## 2014-03-27 DIAGNOSIS — K3184 Gastroparesis: Secondary | ICD-10-CM | POA: Diagnosis not present

## 2014-03-27 DIAGNOSIS — K311 Adult hypertrophic pyloric stenosis: Secondary | ICD-10-CM | POA: Diagnosis present

## 2014-03-27 DIAGNOSIS — E039 Hypothyroidism, unspecified: Secondary | ICD-10-CM | POA: Diagnosis present

## 2014-03-27 DIAGNOSIS — I517 Cardiomegaly: Secondary | ICD-10-CM | POA: Diagnosis not present

## 2014-03-27 DIAGNOSIS — E871 Hypo-osmolality and hyponatremia: Secondary | ICD-10-CM

## 2014-03-27 DIAGNOSIS — M19019 Primary osteoarthritis, unspecified shoulder: Secondary | ICD-10-CM | POA: Diagnosis present

## 2014-03-27 DIAGNOSIS — Z8673 Personal history of transient ischemic attack (TIA), and cerebral infarction without residual deficits: Secondary | ICD-10-CM | POA: Diagnosis not present

## 2014-03-27 DIAGNOSIS — M6281 Muscle weakness (generalized): Secondary | ICD-10-CM | POA: Diagnosis not present

## 2014-03-27 DIAGNOSIS — R0789 Other chest pain: Secondary | ICD-10-CM | POA: Diagnosis not present

## 2014-03-27 DIAGNOSIS — Z66 Do not resuscitate: Secondary | ICD-10-CM | POA: Diagnosis present

## 2014-03-27 DIAGNOSIS — G319 Degenerative disease of nervous system, unspecified: Secondary | ICD-10-CM | POA: Diagnosis present

## 2014-03-27 DIAGNOSIS — K5660 Unspecified intestinal obstruction: Secondary | ICD-10-CM | POA: Diagnosis not present

## 2014-03-27 DIAGNOSIS — E222 Syndrome of inappropriate secretion of antidiuretic hormone: Secondary | ICD-10-CM | POA: Diagnosis present

## 2014-03-27 DIAGNOSIS — K432 Incisional hernia without obstruction or gangrene: Secondary | ICD-10-CM | POA: Diagnosis present

## 2014-03-27 DIAGNOSIS — E1165 Type 2 diabetes mellitus with hyperglycemia: Secondary | ICD-10-CM | POA: Diagnosis present

## 2014-03-27 DIAGNOSIS — E785 Hyperlipidemia, unspecified: Secondary | ICD-10-CM | POA: Diagnosis present

## 2014-03-27 DIAGNOSIS — R2681 Unsteadiness on feet: Secondary | ICD-10-CM | POA: Diagnosis not present

## 2014-03-27 DIAGNOSIS — I700 Atherosclerosis of aorta: Secondary | ICD-10-CM | POA: Diagnosis present

## 2014-03-27 DIAGNOSIS — K224 Dyskinesia of esophagus: Secondary | ICD-10-CM | POA: Diagnosis present

## 2014-03-27 DIAGNOSIS — K219 Gastro-esophageal reflux disease without esophagitis: Secondary | ICD-10-CM | POA: Diagnosis present

## 2014-03-27 DIAGNOSIS — E1169 Type 2 diabetes mellitus with other specified complication: Secondary | ICD-10-CM | POA: Diagnosis present

## 2014-03-27 DIAGNOSIS — H04123 Dry eye syndrome of bilateral lacrimal glands: Secondary | ICD-10-CM | POA: Diagnosis not present

## 2014-03-27 DIAGNOSIS — I6930 Unspecified sequelae of cerebral infarction: Secondary | ICD-10-CM | POA: Diagnosis not present

## 2014-03-27 DIAGNOSIS — R278 Other lack of coordination: Secondary | ICD-10-CM | POA: Diagnosis not present

## 2014-03-27 DIAGNOSIS — Z7982 Long term (current) use of aspirin: Secondary | ICD-10-CM | POA: Diagnosis not present

## 2014-03-27 LAB — BASIC METABOLIC PANEL
Anion gap: 9 (ref 5–15)
BUN: 9 mg/dL (ref 6–23)
CO2: 23 mmol/L (ref 19–32)
Calcium: 9.3 mg/dL (ref 8.4–10.5)
Chloride: 87 mEq/L — ABNORMAL LOW (ref 96–112)
Creatinine, Ser: 0.87 mg/dL (ref 0.50–1.10)
GFR calc Af Amer: 67 mL/min — ABNORMAL LOW (ref >90)
GFR calc non Af Amer: 58 mL/min — ABNORMAL LOW (ref >90)
Glucose, Bld: 147 mg/dL — ABNORMAL HIGH (ref 70–99)
Potassium: 4.2 mmol/L (ref 3.5–5.1)
Sodium: 119 mmol/L — CL (ref 135–145)

## 2014-03-27 LAB — CBC
HCT: 29.4 % — ABNORMAL LOW (ref 36.0–46.0)
HCT: 28.4 % — ABNORMAL LOW (ref 36.0–46.0)
Hemoglobin: 10.4 g/dL — ABNORMAL LOW (ref 12.0–15.0)
Hemoglobin: 10.2 g/dL — ABNORMAL LOW (ref 12.0–15.0)
MCH: 29.6 pg (ref 26.0–34.0)
MCH: 30.2 pg (ref 26.0–34.0)
MCHC: 35.4 g/dL (ref 30.0–36.0)
MCHC: 35.9 g/dL (ref 30.0–36.0)
MCV: 83.8 fL (ref 78.0–100.0)
MCV: 84 fL (ref 78.0–100.0)
Platelets: 316 10*3/uL (ref 150–400)
Platelets: 308 10*3/uL (ref 150–400)
RBC: 3.51 MIL/uL — ABNORMAL LOW (ref 3.87–5.11)
RBC: 3.38 MIL/uL — ABNORMAL LOW (ref 3.87–5.11)
RDW: 14.8 % (ref 11.5–15.5)
RDW: 15 % (ref 11.5–15.5)
WBC: 4.1 10*3/uL (ref 4.0–10.5)
WBC: 4.5 10*3/uL (ref 4.0–10.5)

## 2014-03-27 LAB — I-STAT TROPONIN, ED: Troponin i, poc: 0 ng/mL (ref 0.00–0.08)

## 2014-03-27 LAB — TROPONIN I
Troponin I: <0.03 ng/mL (ref <0.031)
Troponin I: <0.03 ng/mL (ref <0.031)

## 2014-03-27 LAB — CREATININE, SERUM
Creatinine, Ser: 0.98 mg/dL (ref 0.50–1.10)
GFR calc Af Amer: 58 mL/min — ABNORMAL LOW (ref >90)
GFR calc non Af Amer: 50 mL/min — ABNORMAL LOW (ref >90)

## 2014-03-27 MED ORDER — FUROSEMIDE 40 MG PO TABS
40.0000 mg | ORAL_TABLET | Freq: Once | ORAL | Status: AC
  Administered 2014-03-27: 40 mg via ORAL
  Filled 2014-03-27: qty 1

## 2014-03-27 MED ORDER — CLONIDINE HCL 0.3 MG PO TABS
0.3000 mg | ORAL_TABLET | Freq: Three times a day (TID) | ORAL | Status: DC
  Administered 2014-03-27 – 2014-03-30 (×5): 0.3 mg via ORAL
  Filled 2014-03-27 (×10): qty 1

## 2014-03-27 MED ORDER — TRAMADOL HCL 50 MG PO TABS: 50.0000 mg | ORAL_TABLET | Freq: Two times a day (BID) | ORAL | Status: DC | PRN

## 2014-03-27 MED ORDER — L-METHYLFOLATE-B6-B12 3-35-2 MG PO TABS
1.0000 | ORAL_TABLET | Freq: Every morning | ORAL | Status: DC
  Administered 2014-03-28 – 2014-03-30 (×3): 1 via ORAL
  Filled 2014-03-27 (×3): qty 1

## 2014-03-27 MED ORDER — ASPIRIN EC 81 MG PO TBEC
81.0000 mg | DELAYED_RELEASE_TABLET | Freq: Every morning | ORAL | Status: DC
  Administered 2014-03-28 – 2014-03-30 (×3): 81 mg via ORAL
  Filled 2014-03-27 (×3): qty 1

## 2014-03-27 MED ORDER — ONDANSETRON HCL 4 MG/2ML IJ SOLN: 4.0000 mg | Freq: Four times a day (QID) | INTRAMUSCULAR | Status: DC | PRN

## 2014-03-27 MED ORDER — HYDRALAZINE HCL 50 MG PO TABS
100.0000 mg | ORAL_TABLET | Freq: Three times a day (TID) | ORAL | Status: DC
  Administered 2014-03-27 – 2014-03-30 (×6): 100 mg via ORAL
  Filled 2014-03-27 (×10): qty 2

## 2014-03-27 MED ORDER — CETYLPYRIDINIUM CHLORIDE 0.05 % MT LIQD
7.0000 mL | Freq: Two times a day (BID) | OROMUCOSAL | Status: DC
  Administered 2014-03-27 – 2014-03-30 (×6): 7 mL via OROMUCOSAL

## 2014-03-27 MED ORDER — CLONIDINE HCL 0.3 MG/24HR TD PTWK: 0.3000 mg | MEDICATED_PATCH | TRANSDERMAL | Status: DC

## 2014-03-27 MED ORDER — SUCRALFATE 1 G PO TABS
1.0000 g | ORAL_TABLET | Freq: Three times a day (TID) | ORAL | Status: DC
  Administered 2014-03-28 – 2014-03-30 (×7): 1 g via ORAL
  Filled 2014-03-27 (×10): qty 1

## 2014-03-27 MED ORDER — CLONIDINE ORAL SUSPENSION 10 MCG/ML: 0.2000 mg | Freq: Three times a day (TID) | ORAL | Status: DC

## 2014-03-27 MED ORDER — POLYETHYLENE GLYCOL 3350 17 G PO PACK: 17.0000 g | PACK | Freq: Every day | ORAL | Status: DC | PRN

## 2014-03-27 MED ORDER — ACETAMINOPHEN 325 MG PO TABS: 650.0000 mg | ORAL_TABLET | ORAL | Status: DC | PRN

## 2014-03-27 MED ORDER — AMLODIPINE BESYLATE 10 MG PO TABS
10.0000 mg | ORAL_TABLET | Freq: Every day | ORAL | Status: DC
  Administered 2014-03-28: 10 mg via ORAL
  Filled 2014-03-27 (×2): qty 1

## 2014-03-27 MED ORDER — SODIUM CHLORIDE 0.9 % IV SOLN
INTRAVENOUS | Status: DC
  Administered 2014-03-27: 17:00:00 via INTRAVENOUS

## 2014-03-27 MED ORDER — SODIUM CHLORIDE 1 G PO TABS
1.0000 g | ORAL_TABLET | Freq: Two times a day (BID) | ORAL | Status: DC
  Administered 2014-03-27 – 2014-03-28 (×3): 1 g via ORAL
  Filled 2014-03-27 (×6): qty 1

## 2014-03-27 MED ORDER — LEVOTHYROXINE SODIUM 200 MCG PO TABS
200.0000 ug | ORAL_TABLET | Freq: Every day | ORAL | Status: DC
  Administered 2014-03-28 – 2014-03-30 (×3): 200 ug via ORAL
  Filled 2014-03-27 (×4): qty 1

## 2014-03-27 MED ORDER — CYCLOSPORINE 0.05 % OP EMUL
1.0000 [drp] | Freq: Two times a day (BID) | OPHTHALMIC | Status: DC
  Administered 2014-03-27 – 2014-03-30 (×6): 1 [drp] via OPHTHALMIC
  Filled 2014-03-27 (×7): qty 1

## 2014-03-27 MED ORDER — ACETAMINOPHEN 160 MG/5ML PO SOLN
650.0000 mg | Freq: Four times a day (QID) | ORAL | Status: DC | PRN
  Filled 2014-03-27: qty 20.3

## 2014-03-27 MED ORDER — POLYVINYL ALCOHOL 1.4 % OP SOLN
1.0000 [drp] | OPHTHALMIC | Status: DC | PRN
  Filled 2014-03-27: qty 15

## 2014-03-27 MED ORDER — ATORVASTATIN CALCIUM 10 MG PO TABS
10.0000 mg | ORAL_TABLET | Freq: Every day | ORAL | Status: DC
  Administered 2014-03-27 – 2014-03-29 (×3): 10 mg via ORAL
  Filled 2014-03-27 (×4): qty 1

## 2014-03-27 MED ORDER — GABAPENTIN 300 MG PO CAPS
300.0000 mg | ORAL_CAPSULE | Freq: Three times a day (TID) | ORAL | Status: DC
  Administered 2014-03-27 – 2014-03-30 (×8): 300 mg via ORAL
  Filled 2014-03-27 (×10): qty 1

## 2014-03-27 MED ORDER — NITROGLYCERIN 0.2 MG/HR TD PT24
0.2000 mg | MEDICATED_PATCH | Freq: Every day | TRANSDERMAL | Status: DC
  Administered 2014-03-28: 0.2 mg via TRANSDERMAL
  Filled 2014-03-27 (×2): qty 1

## 2014-03-27 MED ORDER — SODIUM CHLORIDE 0.9 % IV SOLN
INTRAVENOUS | Status: AC
  Administered 2014-03-27: 19:00:00 via INTRAVENOUS

## 2014-03-27 MED ORDER — ISOSORBIDE MONONITRATE ER 60 MG PO TB24
60.0000 mg | ORAL_TABLET | Freq: Every morning | ORAL | Status: DC
  Administered 2014-03-28 – 2014-03-30 (×3): 60 mg via ORAL
  Filled 2014-03-27 (×3): qty 1

## 2014-03-27 MED ORDER — POLYETHYLENE GLYCOL 3350 17 G PO PACK
17.0000 g | PACK | Freq: Every day | ORAL | Status: DC | PRN
  Administered 2014-03-29: 17 g via ORAL
  Filled 2014-03-27 (×2): qty 1

## 2014-03-27 MED ORDER — ONDANSETRON HCL 4 MG PO TABS: 4.0000 mg | ORAL_TABLET | Freq: Four times a day (QID) | ORAL | Status: DC | PRN

## 2014-03-27 MED ORDER — PANTOPRAZOLE SODIUM 40 MG PO TBEC
40.0000 mg | DELAYED_RELEASE_TABLET | Freq: Two times a day (BID) | ORAL | Status: DC
  Administered 2014-03-27 – 2014-03-30 (×6): 40 mg via ORAL
  Filled 2014-03-27 (×6): qty 1

## 2014-03-27 MED ORDER — CHLORHEXIDINE GLUCONATE 0.12 % MT SOLN
15.0000 mL | Freq: Two times a day (BID) | OROMUCOSAL | Status: DC
  Administered 2014-03-27 – 2014-03-30 (×5): 15 mL via OROMUCOSAL
  Filled 2014-03-27 (×8): qty 15

## 2014-03-27 MED ORDER — LATANOPROST 0.005 % OP SOLN
1.0000 [drp] | Freq: Every day | OPHTHALMIC | Status: DC
  Administered 2014-03-27 – 2014-03-30 (×3): 1 [drp] via OPHTHALMIC
  Filled 2014-03-27: qty 2.5

## 2014-03-27 MED ORDER — FLUTICASONE PROPIONATE 50 MCG/ACT NA SUSP
2.0000 | Freq: Every day | NASAL | Status: DC
  Administered 2014-03-28 – 2014-03-30 (×3): 2 via NASAL
  Filled 2014-03-27: qty 16

## 2014-03-27 MED ORDER — SENNA-DOCUSATE SODIUM 8.6-50 MG PO TABS
1.0000 | ORAL_TABLET | Freq: Two times a day (BID) | ORAL | Status: DC
  Administered 2014-03-27 – 2014-03-30 (×6): 1 via ORAL
  Filled 2014-03-27 (×7): qty 1

## 2014-03-27 MED ORDER — HEPARIN SODIUM (PORCINE) 5000 UNIT/ML IJ SOLN
5000.0000 [IU] | Freq: Three times a day (TID) | INTRAMUSCULAR | Status: DC
  Administered 2014-03-27 – 2014-03-30 (×8): 5000 [IU] via SUBCUTANEOUS
  Filled 2014-03-27 (×11): qty 1

## 2014-03-27 MED ORDER — METOPROLOL SUCCINATE ER 50 MG PO TB24
50.0000 mg | ORAL_TABLET | Freq: Every day | ORAL | Status: DC
  Administered 2014-03-28: 50 mg via ORAL
  Filled 2014-03-27 (×2): qty 1

## 2014-03-27 MED ORDER — B COMPLEX-C PO TABS
1.0000 | ORAL_TABLET | Freq: Two times a day (BID) | ORAL | Status: DC
  Administered 2014-03-27 – 2014-03-30 (×6): 1 via ORAL
  Filled 2014-03-27 (×7): qty 1

## 2014-03-27 MED ORDER — CALCIUM CARBONATE-VITAMIN D 500-200 MG-UNIT PO TABS
1.0000 | ORAL_TABLET | Freq: Two times a day (BID) | ORAL | Status: DC
  Administered 2014-03-27 – 2014-03-30 (×6): 1 via ORAL
  Filled 2014-03-27 (×7): qty 1

## 2014-03-27 MED ORDER — SERTRALINE HCL 100 MG PO TABS
100.0000 mg | ORAL_TABLET | Freq: Every morning | ORAL | Status: DC
  Administered 2014-03-28 – 2014-03-30 (×3): 100 mg via ORAL
  Filled 2014-03-27 (×3): qty 1

## 2014-03-27 MED ORDER — BOOST / RESOURCE BREEZE PO LIQD
1.0000 | Freq: Three times a day (TID) | ORAL | Status: DC
  Administered 2014-03-27 – 2014-03-30 (×8): 1 via ORAL

## 2014-03-27 MED ORDER — ADULT MULTIVITAMIN W/MINERALS CH
1.0000 | ORAL_TABLET | Freq: Every morning | ORAL | Status: DC
  Administered 2014-03-28 – 2014-03-30 (×3): 1 via ORAL
  Filled 2014-03-27 (×3): qty 1

## 2014-03-27 MED ORDER — BOOST PLUS PO LIQD
237.0000 mL | Freq: Two times a day (BID) | ORAL | Status: DC
  Administered 2014-03-29 – 2014-03-30 (×4): 237 mL via ORAL
  Filled 2014-03-27 (×8): qty 237

## 2014-03-27 NOTE — H&P (Signed)
Patient Demographics  Shannon [name], is a 80 y.o. female  MRN: [id number]   DOB - [birth year]  Admit Date - 03/27/2014  Outpatient Primary MD for the patient is Oneal Grout, MD   With History of -  Past Medical History  Diagnosis Date  . Pneumonia   . Thyroid disease   . Seizures   . Stroke   . Hyperlipidemia   . Hypertension   . Diabetes mellitus without complication   . Small bowel obstruction   . Ventral hernia with bowel obstruction       Past Surgical History  Procedure Laterality Date  . Exploratory laparotomy  09-07-2013  . Cholecystectomy    . Tubal ligation    . Colectomy    . Hernia repair  2015  . Esophagogastroduodenoscopy N/A 01/19/2014    Procedure: ESOPHAGOGASTRODUODENOSCOPY (EGD);  Surgeon: Hilarie Fredrickson, MD;  Location: [facility];  Service: Endoscopy;  Laterality: N/A;  . Esophagogastroduodenoscopy N/A 01/20/2014    Procedure: ESOPHAGOGASTRODUODENOSCOPY (EGD);  Surgeon: Hilarie Fredrickson, MD;  Location: [facility];  Service: Endoscopy;  Laterality: N/A;  . Esophagogastroduodenoscopy N/A 03/19/2014    Procedure: ESOPHAGOGASTRODUODENOSCOPY (EGD);  Surgeon: Rachael Fee, MD;  Location: [facility];  Service: Endoscopy;  Laterality: N/A;    in for   Chief Complaint  Patient presents with  . Chest Pain     HPI  Shannon [name]  is a 80 y.o. 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    Review of Systems    In addition to the HPI above, No Fever-chills, No Headache, No changes with Vision or hearing, No problems swallowing food or Liquids, And planes of Chest pain, but no Cough or Shortness of Breath, No Abdominal pain, No Nausea or Vommitting, Bowel movements are regular, No Blood in stool or Urine, No dysuria, No new skin rashes or bruises, No new joints pains-aches,  No new weakness, tingling, numbness in any extremity, No recent weight gain or loss, No polyuria, polydypsia or polyphagia, No significant Mental Stressors.  A full 10 point Review of Systems was done, except as stated above, all other Review of Systems were negative.   Social History History  Substance Use Topics  . Smoking status: Never Smoker   . Smokeless tobacco: Not on file  . Alcohol Use: No     Family History Family History  Problem Relation Age of Onset  . Stroke Mother   . Heart attack Neg Hx   . Hypertension Mother   . Diabetes Brother      Prior to Admission medications   Medication Sig Start Date End Date Taking? Authorizing Provider  acetaminophen (TYLENOL) 160 MG/5ML solution Take 20.3 mLs (650 mg total) by mouth every 6 (six) hours as needed for mild pain or fever (>=101). 03/26/14  Yes Alison Murray, MD  amLODipine [facility])  1 mg/mL SUSP oral suspension Take 10 mLs (10 mg total) by mouth daily. 03/26/14  Yes Alison Murray, MD  antiseptic oral rinse (CPC / CETYLPYRIDINIUM CHLORIDE 0.05%) 0.05 % LIQD solution 7 mLs by Mouth Rinse route 2 times daily at 12 noon and 4 pm. 03/26/14  Yes Alison Murray, MD  aspirin EC 81 MG tablet Take 81 mg by mouth every morning.   Yes Historical Provider, MD  atorvastatin (LIPITOR) 10 MG tablet Take 10 mg by mouth daily at 6 PM.   Yes Historical Provider, MD  B Complex-C  (B-COMPLEX WITH VITAMIN C) tablet Take 1 tablet by mouth 2 (two) times daily.   Yes Historical Provider, MD  calcium-vitamin D (OSCAL WITH D) 500-200 MG-UNIT per tablet Take 1 tablet by mouth 2 (two) times daily.   Yes Historical Provider, MD  chlorhexidine (PERIDEX) 0.12 % solution 15 mLs by Mouth Rinse route 2 (two) times daily. 03/26/14  Yes Alison Murray, MD  cloNIDine (CATAPRES - DOSED IN MG/24 HR) 0.3 mg/24hr patch Place 1 patch (0.3 mg total) onto the skin once a week. 03/26/14  Yes Alison Murray, MD  cloNIDine (CATAPRES) 0.3 MG tablet Take 0.3 mg by mouth 3 (three) times daily.   Yes Historical Provider, MD  cycloSPORINE (RESTASIS) 0.05 % ophthalmic emulsion Place 1 drop into both eyes 2 (two) times daily.   Yes Historical Provider, MD  fluticasone (FLONASE) 50 MCG/ACT nasal spray Place 2 sprays into both nostrils daily.   Yes Historical Provider, MD  gabapentin (NEURONTIN) 300 MG capsule Take 300 mg by mouth 3 (three) times daily.   Yes Historical Provider, MD  hydrALAZINE (APRESOLINE) 100 MG tablet Take 100 mg by mouth 3 (three) times daily.   Yes Historical Provider, MD  isosorbide mononitrate (IMDUR) 60 MG 24 hr tablet Take 60 mg by mouth every morning.   Yes Historical Provider, MD  l-methylfolate-B6-B12 (METANX) 3-35-2 MG TABS Take 1 tablet by mouth every morning.   Yes Historical Provider, MD  lactose free nutrition (BOOST PLUS) LIQD Take 237 mLs by mouth 2 (two) times daily between meals. 03/26/14  Yes Alison Murray, MD  latanoprost (XALATAN) 0.005 % ophthalmic solution Place 1 drop into the right eye at bedtime. 03/26/14  Yes Alison Murray, MD  levothyroxine (SYNTHROID) 200 MCG tablet Take 1 tablet (200 mcg total) by mouth daily before breakfast. 03/26/14  Yes Alison Murray, MD  metoprolol succinate (TOPROL XL) 50 MG 24 hr tablet Take 1 tablet (50 mg total) by mouth daily. Take with or immediately following a meal. 03/26/14  Yes Alison Murray, MD  Multiple Vitamins-Minerals  (MULTIVITAMIN WITH MINERALS) tablet Take 1 tablet by mouth every morning.    Yes Historical Provider, MD  nitroGLYCERIN (NITRODUR - DOSED IN MG/24 HR) 0.2 mg/hr patch Place 1 patch (0.2 mg total) onto the skin daily. 03/26/14  Yes Alison Murray, MD  ondansetron (ZOFRAN) 4 MG tablet Take 1 tablet (4 mg total) by mouth every 6 (six) hours as needed for nausea. 03/26/14  Yes Alison Murray, MD  pantoprazole (PROTONIX) 40 MG tablet Take 1 tablet (40 mg total) by mouth 2 (two) times daily. 03/26/14  Yes Alison Murray, MD  polyethylene glycol [facility]) packet Take 17 g by mouth daily as needed for mild constipation.   Yes Historical Provider, MD  polyvinyl alcohol (LIQUIFILM TEARS) 1.4 % ophthalmic solution Place 1 drop into both eyes as needed for dry eyes. 03/26/14  Yes Alison Murray, MD  sennosides-docusate sodium (SENOKOT-S) 8.6-50 MG tablet Take 1 tablet by mouth 2 (two) times daily.    Yes Historical Provider, MD  sertraline (ZOLOFT) 100 MG tablet Take 100 mg by mouth every morning.    Yes Historical Provider, MD  sucralfate (CARAFATE) 1 G tablet Take 1 g by mouth 3 (three) times daily with meals.   Yes Historical Provider, MD  traMADol (ULTRAM) 50 MG tablet Take 50 mg by mouth 2 (two) times daily as needed for moderate pain.    Yes Historical Provider, MD    No Known Allergies  Physical Exam  Vitals  Blood pressure 152/59, pulse 62, temperature 98.4 F (36.9 C), temperature source Oral, resp. rate 20, SpO2 100 %.   1. General frail elderly female lying in bed in NAD,    2. Normal affect and insight, Not Suicidal or Homicidal, Awake Alert, Oriented X 3.  3. No F.N deficits, ALL C.Nerves Intact, Strength 5/5 all 4 extremities, Sensation intact all 4 extremities, Plantars down going.  4. Ears and Eyes appear Normal, Conjunctivae clear, PERRLA. Moist Oral Mucosa.  5. Supple Neck, No JVD, No cervical lymphadenopathy appriciated, No Carotid Bruits.  6. Symmetrical Chest wall  movement, Good air movement bilaterally, CTAB.  7. RRR, No Gallops, Rubs or Murmurs, No Parasternal Heave.  8. Positive Bowel Sounds, Abdomen Soft, No tenderness, No organomegaly appriciated,No rebound -guarding or rigidity.  9.  No Cyanosis, Normal Skin Turgor, No Skin Rash or Bruise.  10. Good muscle tone,  joints appear normal , no effusions, Normal ROM.  11. No Palpable Lymph Nodes in Neck or Axillae    Data Review  CBC  Recent Labs Lab 03/22/14 0339 03/23/14 0425 03/24/14 0650 03/26/14 0409 03/27/14 1445  WBC 4.7 4.0 5.9 4.7 4.1  HGB 9.9* 10.2* 10.2* 10.3* 10.4*  HCT 28.9* 29.0* 29.4* 29.4* 29.4*  PLT 341 373 321 317 316  MCV 87.6 87.3 87.2 86.7 83.8  MCH 30.0 30.7 30.3 30.4 29.6  MCHC 34.3 35.2 34.7 35.0 35.4  RDW 15.8* 15.7* 15.6* 15.5 14.8   ------------------------------------------------------------------------------------------------------------------  Chemistries   Recent Labs Lab 03/22/14 0339 03/23/14 0425 03/24/14 0650 03/26/14 0409 03/27/14 1445  NA 127* 125* 126* 125* 119*  K 4.4 4.6 4.4 4.3 4.2  CL 92* 92* 95* 97 87*  CO2 21 21 23 23 23   GLUCOSE 128* 130* 135* 142* 147*  BUN 4* 5* 6 8 9   CREATININE 0.95 0.94 0.85 0.80 0.87  CALCIUM 9.7 9.4 9.1 9.1 9.3   ------------------------------------------------------------------------------------------------------------------ estimated creatinine clearance is 41.8 mL/min (by C-G formula based on Cr of 0.87). ------------------------------------------------------------------------------------------------------------------ No results for input(s): TSH, T4TOTAL, T3FREE, THYROIDAB in the last 72 hours.  Invalid input(s): FREET3   Coagulation profile No results for input(s): INR, PROTIME in the last 168 hours. ------------------------------------------------------------------------------------------------------------------- No results for input(s): DDIMER in the last 72  hours. -------------------------------------------------------------------------------------------------------------------  Cardiac Enzymes No results for input(s): CKMB, TROPONINI, MYOGLOBIN in the last 168 hours.  Invalid input(s): CK ------------------------------------------------------------------------------------------------------------------ Invalid input(s): POCBNP   ---------------------------------------------------------------------------------------------------------------  Urinalysis    Component Value Date/Time   COLORURINE YELLOW 03/22/2014 1000   APPEARANCEUR CLEAR 03/22/2014 1000   LABSPEC 1.009 03/22/2014 1000   PHURINE 7.0 03/22/2014 1000   GLUCOSEU NEGATIVE 03/22/2014 1000   HGBUR NEGATIVE 03/22/2014 1000   BILIRUBINUR NEGATIVE 03/22/2014 1000   KETONESUR NEGATIVE 03/22/2014 1000   PROTEINUR NEGATIVE 03/22/2014 1000   UROBILINOGEN 0.2 03/22/2014 1000   NITRITE NEGATIVE 03/22/2014 1000   LEUKOCYTESUR  NEGATIVE 03/22/2014 1000    ----------------------------------------------------------------------------------------------------------------  Imaging results:   Dg Chest 2 View  03/27/2014   CLINICAL DATA:  Acute chest discomfort  EXAM: CHEST - 2 VIEW  COMPARISON:  03/14/2014  FINDINGS: Chronic elevation of the right hemidiaphragm. Small left effusion noted with left basilar compressive atelectasis suspected. Mild cardiomegaly without superimposed edema or CHF. No pneumothorax. Upper lobes remain clear. Trachea is midline. Atherosclerosis noted of the aorta. Degenerative changes of the spine and shoulders.  IMPRESSION: Cardiomegaly without CHF  Small left effusion with left base compressive atelectasis  Chronic right hemidiaphragm elevation   Electronically Signed   By: Ruel Favors M.D.   On: 03/27/2014 15:43    My personal review of EKG: Rhythm NSR, Rate  70 /min, QTc 434 , no Acute ST changes    Assessment & Plan  Active Problems:   Essential  hypertension, benign   GERD (gastroesophageal reflux disease)   Dyslipidemia   CVA (cerebral vascular accident)   Hypothyroidism   Gastric outlet obstruction   Chest pain   Hyponatremia    Chest pain - Presents with chest pain radiating to the left shoulder, her EKG does not show any significant abnormalities, had negative troponins, patient had recent stress test done in November, which was low risk stress test. -Admit patient to telemetry -Continue to cycle cardiac enzymes and follow the trend -Received 325 mg of aspirin in ED, continue with baby aspirin daily, Imdur, hydralazine, beta blockers.  Hyponatremia -Complement of SIADH, DC IV normal saline, will give one dose Lasix, baseline is 125, will start salt tablets(she was on them as an outpatient but discontinued given hypertension) -As well probably hypothyroidism complement as most recent TSH is 27 during recent hospitalization.  Gastric outlet obstruction -Had extensive workup during recent hospitalization, will continue with clear liquid diet as per discharge recommendation  Dyslipidemia -Continue with statin  Hypothyroidism -Continue with Synthroid, was recently increased from 175 mcg to 200 mcg, check TSH in 4 weeks.   DVT Prophylaxis Heparin -   AM Labs Ordered, also please review Full Orders  Family Communication: Admission, patients condition and plan of care including tests being ordered have been discussed with the patient and daughter who indicate understanding and agree with the plan and Code Status.  Code Status DO NOT RESUSCITATE  Likely DC to  SNF when stable  Condition GUARDED    Time spent in minutes : 60 minutes    Tyrell Brereton M.D on 03/27/2014 at 6:33 PM  Between 7am to 7pm - Pager - [phone]  After 7pm go to [email or web address] - password [email or web address]  And look for the night coverage person covering me after hours  [facility] Office  [phone]   **Disclaimer: This note  may have been dictated with voice recognition software. Similar sounding words can inadvertently be transcribed and this note may contain transcription errors which may not have been corrected upon publication of note.**

## 2014-03-27 NOTE — ED Notes (Signed)
Pt reports to the ED for eval of left sided chest pain that radiates down her left arm. She arrives via [place] from [facility]. Denies any N/V or SOB. She had a nitro patch placed at the facility and she received 324 of ASA PTA. Pt was seen yesterday at [facility] for eval of bowel obstruction. She describes the chest pain as a pressure and is reporting central abd pain as well. Pt NSR en route. She is calm and cooperative. Pt alert and oriented at baseline. Resp e/u and skin warm and dry.

## 2014-03-27 NOTE — ED Notes (Signed)
Lab called with critical value sodium 119 provider notified.

## 2014-03-27 NOTE — ED Notes (Signed)
Pt very shaky so EKG >10 minutes because had to try several times to obtain one with no artifact.

## 2014-03-27 NOTE — ED Provider Notes (Signed)
CSN: [id number]     Arrival date & time 03/27/14  1420 History   First MD Initiated Contact with Patient 03/27/14 1441     Chief Complaint  Patient presents with  . Chest Pain     (Consider location/radiation/quality/duration/timing/severity/associated sxs/prior Treatment) HPI Complains of left-sided anterior chest pain onset a.m. today 2 episodes lasting 10 minutes each. Pain radiates to left arm. She is presently asymptomatic. Brought by EMS. No other associated symptoms. EMS treated patient with peripheral IV of normal saline and aspirin 3 24 mg prior to coming here. Patient was discharged from [facility] yesterday where she underwent evaluation for abdominal pain vomiting developed hypertensive urgency while hospitalized. Was treated with nitroglycerin drip and transferred to skilled nursing facility for rehabilitation. Past Medical History  Diagnosis Date  . Pneumonia   . Thyroid disease   . Seizures   . Stroke   . Hyperlipidemia   . Hypertension   . Diabetes mellitus without complication   . Small bowel obstruction   . Ventral hernia with bowel obstruction    Past Surgical History  Procedure Laterality Date  . Exploratory laparotomy  09-07-2013  . Cholecystectomy    . Tubal ligation    . Colectomy    . Hernia repair  2015  . Esophagogastroduodenoscopy N/A 01/19/2014    Procedure: ESOPHAGOGASTRODUODENOSCOPY (EGD);  Surgeon: Irene Shipper, MD;  Location: [facility];  Service: Endoscopy;  Laterality: N/A;  . Esophagogastroduodenoscopy N/A 01/20/2014    Procedure: ESOPHAGOGASTRODUODENOSCOPY (EGD);  Surgeon: Irene Shipper, MD;  Location: [facility];  Service: Endoscopy;  Laterality: N/A;  . Esophagogastroduodenoscopy N/A 03/19/2014    Procedure: ESOPHAGOGASTRODUODENOSCOPY (EGD);  Surgeon: Milus Banister, MD;  Location: [facility];  Service: Endoscopy;  Laterality: N/A;   Family History  Problem Relation Age of Onset  . Stroke Mother   . Heart attack Neg Hx    . Hypertension Mother   . Diabetes Brother    History  Substance Use Topics  . Smoking status: Never Smoker   . Smokeless tobacco: Not on file  . Alcohol Use: No   OB History    No data available     Review of Systems  Constitutional: Negative.   HENT: Negative.   Respiratory: Negative.   Cardiovascular: Positive for chest pain.  Gastrointestinal: Negative.   Genitourinary:       Chronic urinary incontinence  Musculoskeletal: Negative.   Skin: Negative.   Neurological: Negative.   Psychiatric/Behavioral: Negative.   All other systems reviewed and are negative.     Allergies  Review of patient's allergies indicates no known allergies.  Home Medications   Prior to Admission medications   Medication Sig Start Date End Date Taking? Authorizing Provider  acetaminophen (TYLENOL EX ST ARTHRITIS PAIN) 500 MG tablet Take 500 mg by mouth every 6 (six) hours as needed (for arthritis pain).    Historical Provider, MD  acetaminophen (TYLENOL) 160 MG/5ML solution Take 20.3 mLs (650 mg total) by mouth every 6 (six) hours as needed for mild pain or fever (>=101). 03/26/14   Robbie Lis, MD  amLODipine (NORVASC) 1 mg/mL SUSP oral suspension Take 10 mLs (10 mg total) by mouth daily. 03/26/14   Robbie Lis, MD  antiseptic oral rinse (CPC / CETYLPYRIDINIUM CHLORIDE 0.05%) 0.05 % LIQD solution 7 mLs by Mouth Rinse route 2 times daily at 12 noon and 4 pm. 03/26/14   Robbie Lis, MD  aspirin EC 81 MG tablet Take 81 mg by mouth  every morning.    Historical Provider, MD  atorvastatin (LIPITOR) 10 MG tablet Take 10 mg by mouth daily at 6 PM.    Historical Provider, MD  B Complex-C (B-COMPLEX WITH VITAMIN C) tablet Take 1 tablet by mouth 2 (two) times daily.    Historical Provider, MD  calcium-vitamin D (OSCAL WITH D) 500-200 MG-UNIT per tablet Take 1 tablet by mouth 2 (two) times daily.    Historical Provider, MD  chlorhexidine (PERIDEX) 0.12 % solution 15 mLs by Mouth Rinse route 2 (two)  times daily. 03/26/14   Robbie Lis, MD  cloNIDine (CATAPRES - DOSED IN MG/24 HR) 0.3 mg/24hr patch Place 1 patch (0.3 mg total) onto the skin once a week. 03/26/14   Robbie Lis, MD  cloNIDine (CATAPRES) 10 mcg/mL SUSP Take 20 mLs (0.2 mg total) by mouth 3 (three) times daily. 03/26/14   Robbie Lis, MD  cycloSPORINE (RESTASIS) 0.05 % ophthalmic emulsion Place 1 drop into both eyes 2 (two) times daily.    Historical Provider, MD  Dietary Management Product (VASCULERA) TABS Take 1 tablet by mouth daily.    Historical Provider, MD  fluticasone (FLONASE) 50 MCG/ACT nasal spray Place 2 sprays into both nostrils daily.    Historical Provider, MD  gabapentin (NEURONTIN) 300 MG capsule Take 300 mg by mouth 3 (three) times daily.    Historical Provider, MD  hydrALAZINE (APRESOLINE) 100 MG tablet Take 100 mg by mouth 3 (three) times daily.    Historical Provider, MD  isosorbide mononitrate (IMDUR) 60 MG 24 hr tablet Take 60 mg by mouth every morning.    Historical Provider, MD  l-methylfolate-B6-B12 (METANX) 3-35-2 MG TABS Take 1 tablet by mouth every morning.    Historical Provider, MD  lactose free nutrition (BOOST PLUS) LIQD Take 237 mLs by mouth 2 (two) times daily between meals. 03/26/14   Robbie Lis, MD  latanoprost (XALATAN) 0.005 % ophthalmic solution Place 1 drop into the right eye at bedtime. 03/26/14   Robbie Lis, MD  levothyroxine (SYNTHROID) 200 MCG tablet Take 1 tablet (200 mcg total) by mouth daily before breakfast. 03/26/14   Robbie Lis, MD  metoprolol succinate (TOPROL XL) 50 MG 24 hr tablet Take 1 tablet (50 mg total) by mouth daily. Take with or immediately following a meal. 03/26/14   Robbie Lis, MD  Multiple Vitamins-Minerals (MULTIVITAMIN WITH MINERALS) tablet Take 1 tablet by mouth every morning.     Historical Provider, MD  nitroGLYCERIN (NITRODUR - DOSED IN MG/24 HR) 0.2 mg/hr patch Place 1 patch (0.2 mg total) onto the skin daily. 03/26/14   Robbie Lis, MD   ondansetron (ZOFRAN) 4 MG tablet Take 1 tablet (4 mg total) by mouth every 6 (six) hours as needed for nausea. 03/26/14   Robbie Lis, MD  pantoprazole (PROTONIX) 40 MG tablet Take 1 tablet (40 mg total) by mouth 2 (two) times daily. 03/26/14   Robbie Lis, MD  polyethylene glycol [facility] Floria Raveling) packet Take 17 g by mouth daily as needed for mild constipation.    Historical Provider, MD  polyethylene glycol (MIRALAX / GLYCOLAX) packet Take 17 g by mouth daily as needed for mild constipation. 03/26/14   Robbie Lis, MD  polyvinyl alcohol (LIQUIFILM TEARS) 1.4 % ophthalmic solution Place 1 drop into both eyes as needed for dry eyes. 03/26/14   Robbie Lis, MD  sennosides-docusate sodium (SENOKOT-S) 8.6-50 MG tablet Take 1 tablet by mouth 2 (two) times  daily.     Historical Provider, MD  sertraline (ZOLOFT) 100 MG tablet Take 100 mg by mouth every morning.     Historical Provider, MD  sucralfate (CARAFATE) 1 G tablet Take 1 g by mouth 3 (three) times daily with meals.    Historical Provider, MD  traMADol (ULTRAM) 50 MG tablet Take 50 mg by mouth 2 (two) times daily as needed for moderate pain.     Historical Provider, MD   BP 189/67 mmHg  Pulse 62  Temp(Src) 98.3 F (36.8 C) (Oral)  Resp 20  SpO2 99% Physical Exam  Constitutional: No distress.  Chronically ill-appearing  HENT:  Head: Normocephalic and atraumatic.  Eyes: Conjunctivae are normal. Pupils are equal, round, and reactive to light.  Neck: Neck supple. No tracheal deviation present. No thyromegaly present.  Cardiovascular: Normal rate and regular rhythm.   No murmur heard. Pulmonary/Chest: Effort normal and breath sounds normal.  Abdominal: Soft. Bowel sounds are normal. She exhibits no distension. There is no tenderness.  Musculoskeletal: Normal range of motion. She exhibits no edema or tenderness.  Neurological: She is alert. Coordination normal.  Skin: Skin is warm and dry. No rash noted.  Psychiatric: She has a  normal mood and affect.  Nursing note and vitals reviewed.   ED Course  Procedures (including critical care time) Labs Review Labs Reviewed - No data to display  Imaging Review Dg Ugi W/small Bowel  03/25/2014   CLINICAL DATA:  Nausea and vomiting; history of bezoar.  EXAM: UPPER GI SERIES WITH SMALL BOWEL FOLLOW-THROUGH using thin barium.  FLUOROSCOPY TIME:  1 min, 17 seconds  TECHNIQUE: Combined double contrast and single contrast upper GI series using effervescent crystals, thick barium, and thin barium. Subsequently, serial images of the small bowel were obtained including spot views of the terminal ileum.  COMPARISON:  Abdominal film of March 15, 2014.  FINDINGS: Scout films from 22 December and today were reviewed. A moderate stool burden in the right colon was noted. Scattered diverticula containing radiodense contrast were noted in the left colon.  The patient ingested the barium without difficulty. The thoracic esophagus demonstrated mild changes of presbyesophagus but no evidence of a stricture. There was small non reducible hiatal hernia. The stomach distended reasonably well. The rugal folds were mildly prominent. No ulcer niche was demonstrated. No bezoar was demonstrated. Gastric emptying was prompt. The duodenal bulb and C sweep were normally positioned. Barium moved into the proximal jejunum within 10 min of ingestion of barium preparations.  Over the course of 3 hr and 35 min the barium traversed the small bowel. The jejunum demonstrated a normal feathery mucosal pattern. The ileum demonstrated a normal pattern as well. The terminal ileum was observed fluoroscopically and was normal. The patient voiced no focal tenderness to palpation over the abdomen.  IMPRESSION: 1. No residual bezoar is demonstrated. There is a small hiatal hernia and the rugal folds of the stomach are mildly prominent. Gastric emptying is normal. 2. The duodenum, jejunum, and ileum demonstrate no acute  abnormality. There is no evidence of obstruction.   Electronically Signed   By: David  Martinique   On: 03/25/2014 15:47     EKG Interpretation None      Date: 03/27/2014  Rate: 70  Rhythm: normal sinus rhythm  QRS Axis: normal  Intervals: normal  ST/T Wave abnormalities: normal  Conduction Disutrbances:none  Narrative Interpretation:   Old EKG Reviewed: Unchanged from 03/22/2014 interpreted by me  4:30 PM patient resting comfortably. Chest x-ray  viewed by me Results for orders placed or performed during the hospital encounter of 03/27/14  CBC  Result Value Ref Range   WBC 4.1 4.0 - 10.5 K/uL   RBC 3.51 (L) 3.87 - 5.11 MIL/uL   Hemoglobin 10.4 (L) 12.0 - 15.0 g/dL   HCT 29.4 (L) 36.0 - 46.0 %   MCV 83.8 78.0 - 100.0 fL   MCH 29.6 26.0 - 34.0 pg   MCHC 35.4 30.0 - 36.0 g/dL   RDW 14.8 11.5 - 15.5 %   Platelets 316 150 - 400 K/uL  Basic metabolic panel  Result Value Ref Range   Sodium 119 (LL) 135 - 145 mmol/L   Potassium 4.2 3.5 - 5.1 mmol/L   Chloride 87 (L) 96 - 112 mEq/L   CO2 23 19 - 32 mmol/L   Glucose, Bld 147 (H) 70 - 99 mg/dL   BUN 9 6 - 23 mg/dL   Creatinine, Ser 0.87 0.50 - 1.10 mg/dL   Calcium 9.3 8.4 - 10.5 mg/dL   GFR calc non Af Amer 58 (L) >90 mL/min   GFR calc Af Amer 67 (L) >90 mL/min   Anion gap 9 5 - 15  I-stat troponin, ED (not at [facility])  Result Value Ref Range   Troponin i, poc 0.00 0.00 - 0.08 ng/mL   Comment 3           Ct Abdomen Pelvis Wo Contrast  03/12/2014   CLINICAL DATA:  Constant vomiting today, constipated since Saturday, RIGHT lower quadrant rectal pain, history diabetes, hypertension, prior gastric bezoar  EXAM: CT ABDOMEN AND PELVIS WITHOUT CONTRAST  TECHNIQUE: Multidetector CT imaging of the abdomen and pelvis was performed following the standard protocol without IV contrast. Sagittal and coronal MPR images reconstructed from axial data set.  COMPARISON:  03/07/2014  FINDINGS: Minimal bibasilar atelectasis.  Low-attenuation foci within  liver, question cysts, unchanged.  Metallic foreign body at LEFT ventricle again identified, uncertain etiology.  Gallbladder surgically absent.  Probable tiny LEFT renal cyst 9 mm diameter.  Within limits of a nonenhanced exam, remainder of liver, spleen, pancreas, kidneys and adrenal glands normal appearance.  Persistent marked thickening of the pyloric channel and pre pyloric antrum gastric distention question partial gastric outlet obstruction.  Small amount of ingested contrast is present within proximal small bowel loops.  Large filling defect within the stomach, 12.2 x 10.0 x 15.2 cm, containing bubbly gas, consistent with recurrent bezoar.  Scattered colonic diverticula.  No evidence of bowel obstruction or dilatation.  Extensive atherosclerotic calcifications aorta and minimally in coronary arteries.  High attenuation nodule 13 mm diameter again identified adjacent to the thickened gastroesophageal junction, unchanged but since 03/07/2014 but new since 01/17/2014, question contrast within epiphrenic diverticulum or small focal contained perforation, unlikely calcified lymph node since this is new in past 2 months.  Unremarkable bladder, ureters, uterus and adnexae.  Appendix not visualized.  No definite rectal wall thickening.  No additional mass, adenopathy, free fluid, free air or abscess.  Bones demineralized with scattered degenerative changes.  Surgical scar at ventral abdomen with a small incisional hernia containing a portion of transverse colon.  IMPRESSION: Thickened pylorus and pre-pyloric antrum likely causing partial outlet obstruction.  Large filling defect within stomach consistent with recurrent bezoar 12.2 x 10.0 x 15.2 cm.  Colonic diverticulosis.  Suspected hepatic cysts unchanged.  13 mm high attenuation nodule adjacent to thickened gastroesophageal junction, question contained perforation versus epiphrenic diverticulum containing contrast, new since 01/17/2014 but unchanged since  03/07/2014.  Small incisional hernia containing a portion of the transverse colon wall without obstruction.   Electronically Signed   By: Lavonia Dana M.D.   On: 03/12/2014 23:12   Dg Chest 2 View  03/27/2014   CLINICAL DATA:  Acute chest discomfort  EXAM: CHEST - 2 VIEW  COMPARISON:  03/14/2014  FINDINGS: Chronic elevation of the right hemidiaphragm. Small left effusion noted with left basilar compressive atelectasis suspected. Mild cardiomegaly without superimposed edema or CHF. No pneumothorax. Upper lobes remain clear. Trachea is midline. Atherosclerosis noted of the aorta. Degenerative changes of the spine and shoulders.  IMPRESSION: Cardiomegaly without CHF  Small left effusion with left base compressive atelectasis  Chronic right hemidiaphragm elevation   Electronically Signed   By: Daryll Brod M.D.   On: 03/27/2014 15:43   Ct Head Wo Contrast  03/22/2014   CLINICAL DATA:  Acute onset of slurred speech.  Initial encounter.  EXAM: CT HEAD WITHOUT CONTRAST  TECHNIQUE: Contiguous axial images were obtained from the base of the skull through the vertex without intravenous contrast.  COMPARISON:  CT of the head performed 12/23/2013, and MRI of the brain performed 12/24/2013  FINDINGS: There is no evidence of acute infarction, mass lesion, or intra- or extra-axial hemorrhage on CT.  Prominence of the ventricles and sulci reflects mild to moderate cortical volume loss. Cerebellar atrophy is noted. Scattered periventricular and subcortical white matter change likely reflects small vessel ischemic microangiopathy. A chronic infarct is noted at the left cerebellar hemisphere.  The brainstem and fourth ventricle are within normal limits. The basal ganglia are unremarkable in appearance. The cerebral hemispheres demonstrate grossly normal gray-white differentiation. No mass effect or midline shift is seen.  There is no evidence of fracture; visualized osseous structures are unremarkable in appearance. The  visualized portions of the orbits are within normal limits. The paranasal sinuses and mastoid air cells are well-aerated. No significant soft tissue abnormalities are seen.  IMPRESSION: 1. No acute intracranial pathology seen on CT. 2. Mild to moderate cortical volume loss and scattered small vessel ischemic microangiopathy. 3. 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   Electronically Signed   By: Garald Balding M.D.   On: 03/22/2014 03:30   Mr Brain Wo Contrast  03/23/2014   CLINICAL DATA:  Acute onset of slurred speech. Previous history of CVA.  EXAM: MRI HEAD WITHOUT CONTRAST  TECHNIQUE: Multiplanar, multiecho pulse sequences of the brain and surrounding structures were obtained without intravenous contrast.  COMPARISON:  MR brain 12/24/2013.  CT head 03/22/2014.  FINDINGS: No evidence for acute infarction, hemorrhage, mass lesion, hydrocephalus, or extra-axial fluid. Generalized cerebral and cerebellar atrophy. Extensive T2 and FLAIR hyperintensities throughout the periventricular and subcortical white matter, consistent with small vessel disease. Flow voids are maintained throughout the carotid, basilar, and vertebral arteries. There are no areas of chronic hemorrhage. Pituitary, pineal, and cerebellar tonsils unremarkable. No upper cervical lesions. Mild pannus, with mild cervical spondylosis. Chronic LEFT cerebellar infarct. Chronic BILATERAL thalamic infarcts. Tiny chronic microhemorrhages in the brain, better appreciated on the previous 3T scan at [facility]. No acute sinus or mastoid disease. Negative orbits. Scalp soft tissues unremarkable.  IMPRESSION: Chronic changes as described. No acute intracranial abnormality. Similar appearance to prior MR.   Electronically Signed   By: Rolla Flatten M.D.   On: 03/23/2014 16:22   Ct Abdomen Pelvis W Contrast  03/07/2014   CLINICAL DATA:  80 year old female presenting with rectal pressure gradually worsening over the prior month.  EXAM: CT  ABDOMEN AND PELVIS WITH CONTRAST  TECHNIQUE: Multidetector CT imaging of the abdomen and pelvis was performed using the standard protocol following bolus administration of intravenous contrast.  CONTRAST:  63mL OMNIPAQUE IOHEXOL 300 MG/ML SOLN, 33mL OMNIPAQUE IOHEXOL 300 MG/ML SOLN  COMPARISON:  CT of the abdomen and pelvis 01/17/2014.  FINDINGS: Lower chest: Cardiomegaly with biatrial dilatation. Calcifications of the aortic valve. Atherosclerotic calcifications in the left main and left circumflex coronary arteries. Small hiatal hernia.  Hepatobiliary: Multifocal low-attenuation lesions appear unchanged in size, number and distribution scattered throughout the liver. Many these are too small to definitively characterize, while the larger lesions are intermediate attenuation, favored to represent proteinaceous cysts. The largest of these lesions measures only 12 mm in segment 2 (image 21 of series 2). No new hepatic lesions are noted. No intra or extrahepatic biliary ductal dilatation. Status post cholecystectomy.  Pancreas: Unremarkable.  Spleen: Unremarkable.  Adrenals/Urinary Tract: Again noted is a 1.2 cm intermediate attenuation (23 HU) lesion in the lower pole of the left kidney, with some surrounding architectural distortion, favored to represent a previously ruptured proteinaceous cyst. No suspicious renal lesions are noted. Multifocal cortical thinning in the kidneys bilaterally, presumably mild scarring. No hydroureteronephrosis. Urinary bladder is normal in appearance. Bilateral adrenal glands are normal in appearance.  Stomach/Bowel: As with the prior examination there is extensive thickening of the gastric wall in the antral pre-pyloric region of the stomach. The pylorus is also markedly thickened (up to 15 mm in thickness). The stomach is moderately distended. No pathologic dilatation of small bowel or colon. Postoperative changes of partial colectomy near the rectosigmoid junction are noted. A few  scattered colonic diverticulae are noted, without surrounding inflammatory changes to suggest an acute diverticulitis at this time. The distal rectum and anus are grossly unremarkable in appearance.  Vascular/Lymphatic: Extensive atherosclerosis throughout the abdominal and pelvic vasculature, without evidence of aneurysm or dissection. No lymphadenopathy noted in the abdomen or pelvis.  Reproductive: Retroverted uterus.  Ovaries are atrophic.  Other: No significant volume of ascites.  No pneumoperitoneum.  Musculoskeletal: Tiny ventral hernia, through which there is incomplete herniation of the mid transverse colon; no findings to suggest associated bowel incarceration or obstruction at this time. Old compression fracture of superior endplate of 624THL with approximately 20% loss of anterior vertebral body height (unchanged). There are no aggressive appearing lytic or blastic lesions noted in the visualized portions of the skeleton.  IMPRESSION: 1. No findings to account for the patient's worsening rectal pressure. 2. There is persistent thickening of the pylorus and antral pre-pyloric region of the stomach, which could be inflammatory in the setting of ulcer disease, or could be neoplastic. There is persistent moderate dilatation of the stomach, suggesting some degree of gastric outlet obstruction. Correlation with results of recent upper endoscopy is suggested. 3. Additional incidental findings, as above, similar to prior examination.   Electronically Signed   By: Vinnie Langton M.D.   On: 03/07/2014 16:28   Dg Chest Port 1 View  03/14/2014   CLINICAL DATA:  80 year old female with new onset chest pain beginning today  EXAM: PORTABLE CHEST - 1 VIEW  COMPARISON:  None.  FINDINGS: Cardiomegaly. Atherosclerotic calcification present in the transverse aorta. No focal airspace consolidation, pulmonary edema, pneumothorax. a query trace left pleural effusion. No acute osseous abnormality. The left glenohumeral joint  osteoarthritis. Mild coarsening of the interstitial markings.  IMPRESSION: 1. Query trace left pleural effusion. 2. Cardiomegaly. 3. Aortic atherosclerosis.   Electronically Signed   By: Myrle Sheng  Laurence Ferrari M.D.   On: 03/14/2014 10:12   Dg Abd Portable 1v  03/15/2014   CLINICAL DATA:  Nasogastric tube placement.  EXAM: PORTABLE ABDOMEN - 1 VIEW  COMPARISON:  03/14/2014  FINDINGS: The nasogastric tube is in place, tip coiled within the stomach and tip overlying the region of the fundus. Surgical clips are noted in the right upper quadrant. There is residual contrast in the colon and distal small bowel following esophagram on 03/13/2014. Bowel gas pattern is nonobstructive.  IMPRESSION: Nasogastric tube coiled within the stomach.   Electronically Signed   By: Shon Hale M.D.   On: 03/15/2014 10:49   Dg Abd Portable 2v  03/14/2014   CLINICAL DATA:  80 year old female with persistent abdominal pain and fullness.  EXAM: PORTABLE ABDOMEN - 2 VIEW  COMPARISON:  Esophagram performed yesterday. CT abdomen/ pelvis 03/12/2014  FINDINGS: No evidence of free air on the lateral decubitus view. Contrast material is noted within the stomach and scattered throughout the large bowel. There is no evidence of obstruction. The volume of contrast material remaining within the stomach is greater than expected. The bones are diffusely osteopenic. Surgical clips in the right upper quadrant suggest prior cholecystectomy.  IMPRESSION: 1. No evidence of obstruction or free air. 2. Greater than expected volume of retained oral contrast within the stomach. Query clinical history of delayed gastric emptying or gastroparesis? 3. The bones appear osteopenic.   Electronically Signed   By: Jacqulynn Cadet M.D.   On: 03/14/2014 10:04   Dg Ugi W/small Bowel  03/25/2014   CLINICAL DATA:  Nausea and vomiting; history of bezoar.  EXAM: UPPER GI SERIES WITH SMALL BOWEL FOLLOW-THROUGH using thin barium.  FLUOROSCOPY TIME:  1 min, 17 seconds   TECHNIQUE: Combined double contrast and single contrast upper GI series using effervescent crystals, thick barium, and thin barium. Subsequently, serial images of the small bowel were obtained including spot views of the terminal ileum.  COMPARISON:  Abdominal film of March 15, 2014.  FINDINGS: Scout films from 22 December and today were reviewed. A moderate stool burden in the right colon was noted. Scattered diverticula containing radiodense contrast were noted in the left colon.  The patient ingested the barium without difficulty. The thoracic esophagus demonstrated mild changes of presbyesophagus but no evidence of a stricture. There was small non reducible hiatal hernia. The stomach distended reasonably well. The rugal folds were mildly prominent. No ulcer niche was demonstrated. No bezoar was demonstrated. Gastric emptying was prompt. The duodenal bulb and C sweep were normally positioned. Barium moved into the proximal jejunum within 10 min of ingestion of barium preparations.  Over the course of 3 hr and 35 min the barium traversed the small bowel. The jejunum demonstrated a normal feathery mucosal pattern. The ileum demonstrated a normal pattern as well. The terminal ileum was observed fluoroscopically and was normal. The patient voiced no focal tenderness to palpation over the abdomen.  IMPRESSION: 1. No residual bezoar is demonstrated. There is a small hiatal hernia and the rugal folds of the stomach are mildly prominent. Gastric emptying is normal. 2. The duodenum, jejunum, and ileum demonstrate no acute abnormality. There is no evidence of obstruction.   Electronically Signed   By: David  Martinique   On: 03/25/2014 15:47   Dg Esophagus W/water Sol Cm  03/13/2014   CLINICAL DATA:  Abnormal CT, evaluate for esophageal perforation  EXAM: ESOPHOGRAM/BARIUM SWALLOW  TECHNIQUE: Single contrast examination was performed using water-soluble contrast.  FLUOROSCOPY TIME:  1 min  7 seconds  COMPARISON:  CT  abdomen pelvis dated 03/12/2014 and 03/07/2014  FINDINGS: Esophageal dysmotility.  No fixed esophageal narrowing/stricture.  Suspected small paraesophageal hernia (less likely hiatal hernia or esophageal diverticulum) at the GE junction. The appearance does not suggest a contained esophageal perforation.  Presence of gastroesophageal reflux could not be assessed due to the patient's inability to clear her esophagus.  Moderate food/debris in the stomach.  IMPRESSION: Esophageal dysmotility.  No fixed esophageal narrowing/stricture.  Suspected small paraesophageal hernia at the GE junction. No evidence of contained esophageal perforation.  Moderate food/debris in the stomach.   Electronically Signed   By: Julian Hy M.D.   On: 03/13/2014 09:44    MDM  In light of hyponatremia patient at risk for seizure.anemia is chronic. Plan intravenous hydration with normal saline. Per patient's daughter she requires clear liquid diet of present. Spoke with Dr.Elgergawy plan 23 hour observation, telemetry Final diagnoses:  None   Dx#1 chest pain #2 hyponatremia #3anemia #4 hyperglycemia #5 pleural effusion    Orlie Dakin, MD 03/27/14 1946

## 2014-03-28 DIAGNOSIS — I6930 Unspecified sequelae of cerebral infarction: Secondary | ICD-10-CM | POA: Diagnosis not present

## 2014-03-28 DIAGNOSIS — K5660 Unspecified intestinal obstruction: Secondary | ICD-10-CM | POA: Diagnosis not present

## 2014-03-28 DIAGNOSIS — I10 Essential (primary) hypertension: Secondary | ICD-10-CM

## 2014-03-28 DIAGNOSIS — K5730 Diverticulosis of large intestine without perforation or abscess without bleeding: Secondary | ICD-10-CM | POA: Diagnosis present

## 2014-03-28 DIAGNOSIS — K311 Adult hypertrophic pyloric stenosis: Secondary | ICD-10-CM | POA: Diagnosis not present

## 2014-03-28 DIAGNOSIS — H409 Unspecified glaucoma: Secondary | ICD-10-CM | POA: Diagnosis not present

## 2014-03-28 DIAGNOSIS — Z7982 Long term (current) use of aspirin: Secondary | ICD-10-CM | POA: Diagnosis not present

## 2014-03-28 DIAGNOSIS — K449 Diaphragmatic hernia without obstruction or gangrene: Secondary | ICD-10-CM | POA: Diagnosis present

## 2014-03-28 DIAGNOSIS — Z8673 Personal history of transient ischemic attack (TIA), and cerebral infarction without residual deficits: Secondary | ICD-10-CM | POA: Diagnosis not present

## 2014-03-28 DIAGNOSIS — R079 Chest pain, unspecified: Secondary | ICD-10-CM | POA: Diagnosis not present

## 2014-03-28 DIAGNOSIS — R2681 Unsteadiness on feet: Secondary | ICD-10-CM | POA: Diagnosis not present

## 2014-03-28 DIAGNOSIS — G319 Degenerative disease of nervous system, unspecified: Secondary | ICD-10-CM | POA: Diagnosis present

## 2014-03-28 DIAGNOSIS — E039 Hypothyroidism, unspecified: Secondary | ICD-10-CM | POA: Diagnosis present

## 2014-03-28 DIAGNOSIS — E1165 Type 2 diabetes mellitus with hyperglycemia: Secondary | ICD-10-CM | POA: Diagnosis present

## 2014-03-28 DIAGNOSIS — R278 Other lack of coordination: Secondary | ICD-10-CM | POA: Diagnosis not present

## 2014-03-28 DIAGNOSIS — I700 Atherosclerosis of aorta: Secondary | ICD-10-CM | POA: Diagnosis present

## 2014-03-28 DIAGNOSIS — K432 Incisional hernia without obstruction or gangrene: Secondary | ICD-10-CM | POA: Diagnosis present

## 2014-03-28 DIAGNOSIS — I639 Cerebral infarction, unspecified: Secondary | ICD-10-CM | POA: Diagnosis not present

## 2014-03-28 DIAGNOSIS — M19019 Primary osteoarthritis, unspecified shoulder: Secondary | ICD-10-CM | POA: Diagnosis present

## 2014-03-28 DIAGNOSIS — E222 Syndrome of inappropriate secretion of antidiuretic hormone: Secondary | ICD-10-CM | POA: Diagnosis not present

## 2014-03-28 DIAGNOSIS — K3184 Gastroparesis: Secondary | ICD-10-CM | POA: Diagnosis not present

## 2014-03-28 DIAGNOSIS — K224 Dyskinesia of esophagus: Secondary | ICD-10-CM | POA: Diagnosis present

## 2014-03-28 DIAGNOSIS — Z9049 Acquired absence of other specified parts of digestive tract: Secondary | ICD-10-CM | POA: Diagnosis present

## 2014-03-28 DIAGNOSIS — G629 Polyneuropathy, unspecified: Secondary | ICD-10-CM | POA: Diagnosis not present

## 2014-03-28 DIAGNOSIS — H04123 Dry eye syndrome of bilateral lacrimal glands: Secondary | ICD-10-CM | POA: Diagnosis not present

## 2014-03-28 DIAGNOSIS — E785 Hyperlipidemia, unspecified: Secondary | ICD-10-CM | POA: Diagnosis present

## 2014-03-28 DIAGNOSIS — M6281 Muscle weakness (generalized): Secondary | ICD-10-CM | POA: Diagnosis not present

## 2014-03-28 DIAGNOSIS — Z66 Do not resuscitate: Secondary | ICD-10-CM | POA: Diagnosis present

## 2014-03-28 DIAGNOSIS — E871 Hypo-osmolality and hyponatremia: Secondary | ICD-10-CM | POA: Diagnosis not present

## 2014-03-28 DIAGNOSIS — M47812 Spondylosis without myelopathy or radiculopathy, cervical region: Secondary | ICD-10-CM | POA: Diagnosis present

## 2014-03-28 DIAGNOSIS — K219 Gastro-esophageal reflux disease without esophagitis: Secondary | ICD-10-CM | POA: Diagnosis present

## 2014-03-28 LAB — BASIC METABOLIC PANEL
Anion gap: 11 (ref 5–15)
BUN: 12 mg/dL (ref 6–23)
CO2: 24 mmol/L (ref 19–32)
Calcium: 9.4 mg/dL (ref 8.4–10.5)
Chloride: 86 mEq/L — ABNORMAL LOW (ref 96–112)
Creatinine, Ser: 1.32 mg/dL — ABNORMAL HIGH (ref 0.50–1.10)
GFR calc Af Amer: 40 mL/min — ABNORMAL LOW (ref >90)
GFR calc non Af Amer: 35 mL/min — ABNORMAL LOW (ref >90)
Glucose, Bld: 178 mg/dL — ABNORMAL HIGH (ref 70–99)
Potassium: 4.2 mmol/L (ref 3.5–5.1)
Sodium: 121 mmol/L — ABNORMAL LOW (ref 135–145)

## 2014-03-28 LAB — TROPONIN I: Troponin I: <0.03 ng/mL (ref <0.031)

## 2014-03-28 MED ORDER — METOCLOPRAMIDE HCL 10 MG PO TABS
10.0000 mg | ORAL_TABLET | Freq: Three times a day (TID) | ORAL | Status: DC
  Administered 2014-03-28 – 2014-03-30 (×5): 10 mg via ORAL
  Filled 2014-03-28 (×7): qty 1

## 2014-03-28 NOTE — Progress Notes (Signed)
INITIAL NUTRITION ASSESSMENT  DOCUMENTATION CODES Per approved criteria  -Not Applicable   INTERVENTION: 1.  General healthful diet; encourage intake of clear liquid beverages as able.  RD to follow and assess for nutritional adequacy.  2. Modify diet; advance PO diet to full liquids once medically appropriate per MD discretion. 3.  Supplements; Resource Breeze po TID, each supplement provides 250 kcal and 9 grams of protein.  Will transition to Boost once diet order allows.  - RD will continue to follow.   NUTRITION DIAGNOSIS: Inadequate oral intake related to inability to eat as evidenced by NPO.   Goal: Pt to meet >/= 90% of their estimated nutrition needs   Monitor:  Weight trend, NPO, labs  Reason for Assessment: MST  80 y.o. female  Admitting Dx: chest pain  ASSESSMENT: Patient recently discharged from [facility] with gastric outlet obstruction, presents to the ED with chest pain.  Patient found to have hyponatremia.  RD reviewed recent nutrition assessment and well as plan progression which included d/c with a full liquid diet and consideration of nutrition support.   Weight remains consistent with her usual weight- reported as 150 lbs, currently 147 lbs.   RD met with patient and granddaughter at bedside who state patient has two concerns during admission- chest pain and ongoing management for GI outlet obstruction.  They are questioning whether the two are related and feel that when management for one problem occurs, the other problem gets dropped/ put aside.  Granddaughter states that patient was on reglan during last admission, however this was stopped and not continued.  At d/c to [facility], patient was placed on a Regular diet, but only consumed full liquids.  They state patient was told she could have a puree diet if she wanted.  RD reviewed most recent admission and feels that patient and family would benefit from review of nutrition-related care plan once known.   RD  to follow.  Recommend diet advancement to fulls once medically appropriate.  If care team does feel pureed diet is appropriate, recommend advancing diet for trial prior to d/c.    Patient takes Tums prn at home for GERD.  Started protonix and carafate today.   Height: Ht Readings from Last 1 Encounters:  03/27/14 '5\' 6"'  (1.676 m)    Weight: Wt Readings from Last 1 Encounters:  03/27/14 147 lb 11.3 oz (67 kg)    Ideal Body Weight: 59.3 kg  % Ideal Body Weight: 110%  Wt Readings from Last 10 Encounters:  03/27/14 147 lb 11.3 oz (67 kg)  03/26/14 136 lb 14.5 oz (62.1 kg)  03/12/14 132 lb (59.875 kg)  02/24/14 150 lb (68.04 kg)  02/18/14 152 lb (68.947 kg)  02/03/14 152 lb (68.947 kg)  01/21/14 159 lb 9.8 oz (72.4 kg)  01/07/14 155 lb (70.308 kg)  12/25/13 149 lb 4.8 oz (67.722 kg)  11/25/13 151 lb (68.493 kg)    Usual Body Weight: 150 lbs  % Usual Body Weight: 95.3%  BMI:  Body mass index is 23.85 kg/(m^2).  Estimated Nutritional Needs: Kcal: 1625-1950 Protein: 85-95 g Fluid: 2.0 L/day  Skin: intact  Diet Order:  clear liquids  EDUCATION NEEDS: -Education needs addressed   Intake/Output Summary (Last 24 hours) at 03/28/14 1229 Last data filed at 03/28/14 1226  Gross per 24 hour  Intake    480 ml  Output   1251 ml  Net   -771 ml    Last BM: 12/20  Labs:   Recent Labs  Lab 03/24/14 0650 03/26/14 0409 03/27/14 1445 03/27/14 1831  NA 126* 125* 119*  --   K 4.4 4.3 4.2  --   CL 95* 97 87*  --   CO2 '23 23 23  ' --   BUN '6 8 9  ' --   CREATININE 0.85 0.80 0.87 0.98  CALCIUM 9.1 9.1 9.3  --   GLUCOSE 135* 142* 147*  --     CBG (last 3)  No results for input(s): GLUCAP in the last 72 hours.  Scheduled Meds: . amLODipine  10 mg Oral Daily  . antiseptic oral rinse  7 mL Mouth Rinse q12n4p  . aspirin EC  81 mg Oral q morning - 10a  . atorvastatin  10 mg Oral q1800  . B-complex with vitamin C  1 tablet Oral BID  . calcium-vitamin D  1 tablet Oral BID   . chlorhexidine  15 mL Mouth Rinse BID  . [START ON 04/02/2014] cloNIDine  0.3 mg Transdermal Weekly  . cloNIDine  0.3 mg Oral TID  . cycloSPORINE  1 drop Both Eyes BID  . feeding supplement (RESOURCE BREEZE)  1 Container Oral TID BM  . fluticasone  2 spray Each Nare Daily  . gabapentin  300 mg Oral TID  . heparin  5,000 Units Subcutaneous 3 times per day  . hydrALAZINE  100 mg Oral TID  . isosorbide mononitrate  60 mg Oral q morning - 10a  . l-methylfolate-B6-B12  1 tablet Oral q morning - 10a  . lactose free nutrition  237 mL Oral BID BM  . latanoprost  1 drop Right Eye QHS  . levothyroxine  200 mcg Oral QAC breakfast  . metoprolol succinate  50 mg Oral Daily  . multivitamin with minerals  1 tablet Oral q morning - 10a  . nitroGLYCERIN  0.2 mg Transdermal Daily  . pantoprazole  40 mg Oral BID  . sennosides-docusate sodium  1 tablet Oral BID  . sertraline  100 mg Oral q morning - 10a  . sodium chloride  1 g Oral BID WC  . sucralfate  1 g Oral TID WC    Continuous Infusions:    Past Medical History  Diagnosis Date  . Pneumonia   . Thyroid disease   . Seizures   . Stroke   . Hyperlipidemia   . Hypertension   . Diabetes mellitus without complication   . Small bowel obstruction   . Ventral hernia with bowel obstruction     Past Surgical History  Procedure Laterality Date  . Exploratory laparotomy  09-07-2013  . Cholecystectomy    . Tubal ligation    . Colectomy    . Hernia repair  2015  . Esophagogastroduodenoscopy N/A 01/19/2014    Procedure: ESOPHAGOGASTRODUODENOSCOPY (EGD);  Surgeon: Irene Shipper, MD;  Location: [facility];  Service: Endoscopy;  Laterality: N/A;  . Esophagogastroduodenoscopy N/A 01/20/2014    Procedure: ESOPHAGOGASTRODUODENOSCOPY (EGD);  Surgeon: Irene Shipper, MD;  Location: [facility];  Service: Endoscopy;  Laterality: N/A;  . Esophagogastroduodenoscopy N/A 03/19/2014    Procedure: ESOPHAGOGASTRODUODENOSCOPY (EGD);  Surgeon: Milus Banister,  MD;  Location: [facility];  Service: Endoscopy;  Laterality: N/A;    Brynda Greathouse, MS RD LDN Clinical Inpatient Dietitian Weekend/After hours pager: [phone]

## 2014-03-28 NOTE — Progress Notes (Signed)
UR completed 

## 2014-03-28 NOTE — Progress Notes (Signed)
[facility] PROGRESS NOTE  Calise Bute ZOX:[id number] DOB: 1923-10-11 DOA: 03/27/2014 PCP: Oneal Grout, MD  Assessment/Plan: Active Problems:   Essential hypertension, benign -Stable on amlodipine, clonidine, hydralazine, isosorbide    GERD (gastroesophageal reflux disease) -Stable on Protonix    Dyslipidemia -Stable we'll continue statin and aspirin    CVA (cerebral vascular accident) - No new focal neurological deficits reported - Continue aspirin and appropriate blood pressure control    Hypothyroidism - Synthroid medication recently increased. Will require TSH evaluation in 4-6 weeks    Gastric outlet obstruction - A showing partial gastric outlet obstruction. Please refer to Dr. Excell Seltzer plans note on last evaluation. He recommended continuing Reglan and full liquid diet.    Chest pain - Troponins 3 negative, atypical most likely related to GI problems    Hyponatremia -Will reassess sodium levels  Code Status: DNR Family Communication: Discussed with family at bedside Disposition Plan: When sodium levels back or near baseline we'll plan on transitioning back to SNF   Consultants:  None  Procedures:  None  Antibiotics:  None  HPI/Subjective: Pt has no new complaints. No acute issues reported overnight.  Objective: Filed Vitals:   03/28/14 0956  BP: 126/54  Pulse: 48  Temp: 98.7 F (37.1 C)  Resp: 16    Intake/Output Summary (Last 24 hours) at 03/28/14 1536 Last data filed at 03/28/14 1226  Gross per 24 hour  Intake    480 ml  Output   1251 ml  Net   -771 ml   Filed Weights   03/27/14 2221  Weight: 67 kg (147 lb 11.3 oz)    Exam:   General:  Pt in nad, alert and wake  Cardiovascular: rrr, no mrg  Respiratory: CTA BL, no wheezes  Abdomen: soft, ND  Musculoskeletal: no cyanosis or clubbing   Data Reviewed: Basic Metabolic Panel:  Recent Labs Lab 03/22/14 0339 03/23/14 0425 03/24/14 0650 03/26/14 0409  03/27/14 1445 03/27/14 1831  NA 127* 125* 126* 125* 119*  --   K 4.4 4.6 4.4 4.3 4.2  --   CL 92* 92* 95* 97 87*  --   CO2 21 21 23 23 23   --   GLUCOSE 128* 130* 135* 142* 147*  --   BUN 4* 5* 6 8 9   --   CREATININE 0.95 0.94 0.85 0.80 0.87 0.98  CALCIUM 9.7 9.4 9.1 9.1 9.3  --    Liver Function Tests: No results for input(s): AST, ALT, ALKPHOS, BILITOT, PROT, ALBUMIN in the last 168 hours. No results for input(s): LIPASE, AMYLASE in the last 168 hours. No results for input(s): AMMONIA in the last 168 hours. CBC:  Recent Labs Lab 03/23/14 0425 03/24/14 0650 03/26/14 0409 03/27/14 1445 03/27/14 1831  WBC 4.0 5.9 4.7 4.1 4.5  HGB 10.2* 10.2* 10.3* 10.4* 10.2*  HCT 29.0* 29.4* 29.4* 29.4* 28.4*  MCV 87.3 87.2 86.7 83.8 84.0  PLT 373 321 317 316 308   Cardiac Enzymes:  Recent Labs Lab 03/27/14 1831 03/27/14 2007 03/27/14 2345  TROPONINI <0.03 <0.03 <0.03   BNP (last 3 results)  Recent Labs  01/17/14 2116  PROBNP 961.6*   CBG: No results for input(s): GLUCAP in the last 168 hours.  Recent Results (from the past 240 hour(s))  Urine culture     Status: None   Collection Time: 03/22/14 10:00 AM  Result Value Ref Range Status   Specimen Description URINE, CATHETERIZED  Final   Special Requests NONE  Final   Culture  Setup Time   Final    03/22/2014 16:51 Performed at [facility]    Colony Count NO GROWTH Performed at [facility]   Final   Culture NO GROWTH Performed at [facility]   Final   Report Status 03/23/2014 FINAL  Final     Studies: Dg Chest 2 View  03/27/2014   CLINICAL DATA:  Acute chest discomfort  EXAM: CHEST - 2 VIEW  COMPARISON:  03/14/2014  FINDINGS: Chronic elevation of the right hemidiaphragm. Small left effusion noted with left basilar compressive atelectasis suspected. Mild cardiomegaly without superimposed edema or CHF. No pneumothorax. Upper lobes remain clear. Trachea is midline. Atherosclerosis noted of  the aorta. Degenerative changes of the spine and shoulders.  IMPRESSION: Cardiomegaly without CHF  Small left effusion with left base compressive atelectasis  Chronic right hemidiaphragm elevation   Electronically Signed   By: Ruel Favors M.D.   On: 03/27/2014 15:43    Scheduled Meds: . amLODipine  10 mg Oral Daily  . antiseptic oral rinse  7 mL Mouth Rinse q12n4p  . aspirin EC  81 mg Oral q morning - 10a  . atorvastatin  10 mg Oral q1800  . B-complex with vitamin C  1 tablet Oral BID  . calcium-vitamin D  1 tablet Oral BID  . chlorhexidine  15 mL Mouth Rinse BID  . [START ON 04/02/2014] cloNIDine  0.3 mg Transdermal Weekly  . cloNIDine  0.3 mg Oral TID  . cycloSPORINE  1 drop Both Eyes BID  . feeding supplement (RESOURCE BREEZE)  1 Container Oral TID BM  . fluticasone  2 spray Each Nare Daily  . gabapentin  300 mg Oral TID  . heparin  5,000 Units Subcutaneous 3 times per day  . hydrALAZINE  100 mg Oral TID  . isosorbide mononitrate  60 mg Oral q morning - 10a  . l-methylfolate-B6-B12  1 tablet Oral q morning - 10a  . lactose free nutrition  237 mL Oral BID BM  . latanoprost  1 drop Right Eye QHS  . levothyroxine  200 mcg Oral QAC breakfast  . metoprolol succinate  50 mg Oral Daily  . multivitamin with minerals  1 tablet Oral q morning - 10a  . nitroGLYCERIN  0.2 mg Transdermal Daily  . pantoprazole  40 mg Oral BID  . sennosides-docusate sodium  1 tablet Oral BID  . sertraline  100 mg Oral q morning - 10a  . sodium chloride  1 g Oral BID WC  . sucralfate  1 g Oral TID WC   Continuous Infusions:    Time spent: > 35 minutes    Penny Pia  [facility] Pager [phone] If 7PM-7AM, please contact night-coverage at [email or web address], password [facility] 03/28/2014, 3:36 PM  LOS: 1 day

## 2014-03-29 LAB — BASIC METABOLIC PANEL
Anion gap: 7 (ref 5–15)
Anion gap: 8 (ref 5–15)
Anion gap: 6 (ref 5–15)
BUN: 14 mg/dL (ref 6–23)
BUN: 16 mg/dL (ref 6–23)
BUN: 16 mg/dL (ref 6–23)
CO2: 27 mmol/L (ref 19–32)
CO2: 25 mmol/L (ref 19–32)
CO2: 26 mmol/L (ref 19–32)
Calcium: 9.5 mg/dL (ref 8.4–10.5)
Calcium: 9.5 mg/dL (ref 8.4–10.5)
Calcium: 9.4 mg/dL (ref 8.4–10.5)
Chloride: 86 mEq/L — ABNORMAL LOW (ref 96–112)
Chloride: 86 mEq/L — ABNORMAL LOW (ref 96–112)
Chloride: 91 mEq/L — ABNORMAL LOW (ref 96–112)
Creatinine, Ser: 1.11 mg/dL — ABNORMAL HIGH (ref 0.50–1.10)
Creatinine, Ser: 1.06 mg/dL (ref 0.50–1.10)
Creatinine, Ser: 0.97 mg/dL (ref 0.50–1.10)
GFR calc Af Amer: 50 mL/min — ABNORMAL LOW (ref >90)
GFR calc Af Amer: 53 mL/min — ABNORMAL LOW (ref >90)
GFR calc Af Amer: 59 mL/min — ABNORMAL LOW (ref >90)
GFR calc non Af Amer: 43 mL/min — ABNORMAL LOW (ref >90)
GFR calc non Af Amer: 45 mL/min — ABNORMAL LOW (ref >90)
GFR calc non Af Amer: 51 mL/min — ABNORMAL LOW (ref >90)
Glucose, Bld: 160 mg/dL — ABNORMAL HIGH (ref 70–99)
Glucose, Bld: 125 mg/dL — ABNORMAL HIGH (ref 70–99)
Glucose, Bld: 142 mg/dL — ABNORMAL HIGH (ref 70–99)
Potassium: 4 mmol/L (ref 3.5–5.1)
Potassium: 4 mmol/L (ref 3.5–5.1)
Potassium: 4.1 mmol/L (ref 3.5–5.1)
Sodium: 120 mmol/L — ABNORMAL LOW (ref 135–145)
Sodium: 119 mmol/L — CL (ref 135–145)
Sodium: 123 mmol/L — ABNORMAL LOW (ref 135–145)

## 2014-03-29 MED ORDER — SODIUM CHLORIDE 1 G PO TABS
2.0000 g | ORAL_TABLET | Freq: Three times a day (TID) | ORAL | Status: DC
  Administered 2014-03-29 – 2014-03-30 (×5): 2 g via ORAL
  Filled 2014-03-29 (×7): qty 2

## 2014-03-29 MED ORDER — METOPROLOL SUCCINATE 12.5 MG HALF TABLET
12.5000 mg | ORAL_TABLET | Freq: Every day | ORAL | Status: DC
  Administered 2014-03-30: 12.5 mg via ORAL
  Filled 2014-03-29: qty 1

## 2014-03-29 NOTE — Progress Notes (Signed)
[facility] PROGRESS NOTE  Oscar Casaday HKV:[id number] DOB: 03/17/1924 DOA: 03/27/2014 PCP: Oneal Grout, MD  Assessment/Plan: Active Problems:   Essential hypertension, benign -Stable on amlodipine, clonidine, hydralazine, isosorbide    GERD (gastroesophageal reflux disease) -Stable on Protonix    Dyslipidemia -Stable we'll continue statin and aspirin    CVA (cerebral vascular accident) - No new focal neurological deficits reported - Continue aspirin and appropriate blood pressure control    Hypothyroidism - Synthroid medication recently increased. Will require TSH evaluation in 4-6 weeks    Gastric outlet obstruction - A showing partial gastric outlet obstruction. Please refer to Dr. Excell Seltzer plans note on last evaluation. He recommended continuing Reglan and full liquid diet.    Chest pain - Troponins 3 negative, atypical most likely related to GI problems    Hyponatremia - Still low. Will increase sodium replacement - reassess q 8 hours  Code Status: DNR Family Communication: Discussed with family at bedside Disposition Plan: When sodium levels back or near baseline we'll plan on transitioning back to SNF   Consultants:  None  Procedures:  None  Antibiotics:  None  HPI/Subjective: Pt has no new complaints. No acute issues reported overnight.  Objective: Filed Vitals:   03/29/14 1012  BP: 127/65  Pulse: 50  Temp:   Resp:     Intake/Output Summary (Last 24 hours) at 03/29/14 1405 Last data filed at 03/29/14 1142  Gross per 24 hour  Intake    600 ml  Output    500 ml  Net    100 ml   Filed Weights   03/27/14 2221  Weight: 67 kg (147 lb 11.3 oz)    Exam:   General:  Pt in nad, alert and wake  Cardiovascular: rrr, no mrg  Respiratory: CTA BL, no wheezes  Abdomen: soft, ND  Musculoskeletal: no cyanosis or clubbing   Data Reviewed: Basic Metabolic Panel:  Recent Labs Lab 03/26/14 0409 03/27/14 1445 03/27/14 1831  03/28/14 1542 03/29/14 0514 03/29/14 1224  NA 125* 119*  --  121* 120* 119*  K 4.3 4.2  --  4.2 4.0 4.0  CL 97 87*  --  86* 86* 86*  CO2 23 23  --  24 27 25   GLUCOSE 142* 147*  --  178* 160* 125*  BUN 8 9  --  12 14 16   CREATININE 0.80 0.87 0.98 1.32* 1.11* 1.06  CALCIUM 9.1 9.3  --  9.4 9.5 9.5   Liver Function Tests: No results for input(s): AST, ALT, ALKPHOS, BILITOT, PROT, ALBUMIN in the last 168 hours. No results for input(s): LIPASE, AMYLASE in the last 168 hours. No results for input(s): AMMONIA in the last 168 hours. CBC:  Recent Labs Lab 03/23/14 0425 03/24/14 0650 03/26/14 0409 03/27/14 1445 03/27/14 1831  WBC 4.0 5.9 4.7 4.1 4.5  HGB 10.2* 10.2* 10.3* 10.4* 10.2*  HCT 29.0* 29.4* 29.4* 29.4* 28.4*  MCV 87.3 87.2 86.7 83.8 84.0  PLT 373 321 317 316 308   Cardiac Enzymes:  Recent Labs Lab 03/27/14 1831 03/27/14 2007 03/27/14 2345  TROPONINI <0.03 <0.03 <0.03   BNP (last 3 results)  Recent Labs  01/17/14 2116  PROBNP 961.6*   CBG: No results for input(s): GLUCAP in the last 168 hours.  Recent Results (from the past 240 hour(s))  Urine culture     Status: None   Collection Time: 03/22/14 10:00 AM  Result Value Ref Range Status   Specimen Description URINE, CATHETERIZED  Final   Special Requests  NONE  Final   Culture  Setup Time   Final    03/22/2014 16:51 Performed at [facility]    Colony Count NO GROWTH Performed at [facility]   Final   Culture NO GROWTH Performed at [facility]   Final   Report Status 03/23/2014 FINAL  Final     Studies: Dg Chest 2 View  03/27/2014   CLINICAL DATA:  Acute chest discomfort  EXAM: CHEST - 2 VIEW  COMPARISON:  03/14/2014  FINDINGS: Chronic elevation of the right hemidiaphragm. Small left effusion noted with left basilar compressive atelectasis suspected. Mild cardiomegaly without superimposed edema or CHF. No pneumothorax. Upper lobes remain clear. Trachea is midline.  Atherosclerosis noted of the aorta. Degenerative changes of the spine and shoulders.  IMPRESSION: Cardiomegaly without CHF  Small left effusion with left base compressive atelectasis  Chronic right hemidiaphragm elevation   Electronically Signed   By: Ruel Favors M.D.   On: 03/27/2014 15:43    Scheduled Meds: . antiseptic oral rinse  7 mL Mouth Rinse q12n4p  . aspirin EC  81 mg Oral q morning - 10a  . atorvastatin  10 mg Oral q1800  . B-complex with vitamin C  1 tablet Oral BID  . calcium-vitamin D  1 tablet Oral BID  . chlorhexidine  15 mL Mouth Rinse BID  . cloNIDine  0.3 mg Oral TID  . cycloSPORINE  1 drop Both Eyes BID  . feeding supplement (RESOURCE BREEZE)  1 Container Oral TID BM  . fluticasone  2 spray Each Nare Daily  . gabapentin  300 mg Oral TID  . heparin  5,000 Units Subcutaneous 3 times per day  . hydrALAZINE  100 mg Oral TID  . isosorbide mononitrate  60 mg Oral q morning - 10a  . l-methylfolate-B6-B12  1 tablet Oral q morning - 10a  . lactose free nutrition  237 mL Oral BID BM  . latanoprost  1 drop Right Eye QHS  . levothyroxine  200 mcg Oral QAC breakfast  . metoCLOPramide  10 mg Oral TID AC  . [START ON 03/30/2014] metoprolol succinate  12.5 mg Oral Daily  . multivitamin with minerals  1 tablet Oral q morning - 10a  . pantoprazole  40 mg Oral BID  . sennosides-docusate sodium  1 tablet Oral BID  . sertraline  100 mg Oral q morning - 10a  . sodium chloride  2 g Oral TID WC  . sucralfate  1 g Oral TID WC   Continuous Infusions:    Time spent: > 35 minutes    Penny Pia  [facility] Pager [phone] If 7PM-7AM, please contact night-coverage at [email or web address], password [facility] 03/29/2014, 2:05 PM  LOS: 2 days

## 2014-03-29 NOTE — Evaluation (Signed)
Physical Therapy Evaluation Patient Details Name: Shannon [name] MRN: [id number] DOB: Mar 15, 1924 Today's Date: 03/29/2014   History of Present Illness  Shannon [name]  is a 79 y.o. 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  Clinical Impression  Pt admitted with the above complications. Pt currently with functional limitations due to the deficits listed below (see PT Problem List). Requires min assist for transfers, min guard for ambulating up to 110 feet with mild balance loss, even with the use of a rolling walker. Pt will benefit from skilled PT to increase their independence and safety with mobility to allow discharge to the venue listed below.       Follow Up Recommendations SNF;Supervision for mobility/OOB    Equipment Recommendations  Rolling walker with 5" wheels    Recommendations for Other Services       Precautions / Restrictions Precautions Precautions: Fall Restrictions Weight Bearing Restrictions: No      Mobility  Bed Mobility Overal bed mobility: Needs Assistance Bed Mobility: Supine to Sit     Supine to sit: HOB elevated;Supervision     General bed mobility comments: Supervision for safety. Moderate use of rail. requires extra time.  Transfers Overall transfer level: Needs assistance Equipment used: Rolling walker (2 wheeled) Transfers: Sit to/from Stand Sit to Stand: Min  assist         General transfer comment: Min assist for boost to stand from lowest bed setting. VC for hand placement on stable surface prior to attempting stand. Good descent into chair.  Ambulation/Gait Ambulation/Gait assistance: Min guard Ambulation Distance (Feet): 110 Feet Assistive device: Rolling walker (2 wheeled) Gait Pattern/deviations: Step-through pattern;Decreased stride length;Leaning posteriorly Gait velocity: decreased   General Gait Details: One instance noted with pt having loss of balance to posterior but was able to self correct with use of rolling walker for support. Close guard for safety. SpO2 95% and greater on room air. HR to 65. Cues for forward gaze intermittently. Pt reports feeling off balance but improves with RW for stability.  Stairs            Wheelchair Mobility    Modified Rankin (Stroke Patients Only)       Balance Overall balance assessment: Needs assistance Sitting-balance support: No upper extremity supported;Feet supported Sitting balance-Leahy Scale: Good     Standing balance support: No upper extremity supported Standing balance-Leahy Scale: Fair                               Pertinent Vitals/Pain Pain Assessment: No/denies pain    Home Living Family/patient expects to be discharged to:: Skilled nursing facility Living Arrangements: Children Available Help at Discharge: Family;Available 24 hours/day Type of Home: House Home Access: Ramped entrance     Home Layout: One level Home Equipment: Walker - 2 wheels;Cane - single point;Shower seat Additional Comments: Hx recently taken at [facility] long. Confirmed by pt today.    Prior Function Level of Independence: Independent with  assistive device(s)         Comments: pt states she does her own bathing/dressing at daughters house. She states she has a 3in1 but it is at her home and she has been living with daughter     Hand Dominance   Dominant Hand:  Right    Extremity/Trunk Assessment   Upper Extremity Assessment: Defer to OT evaluation           Lower Extremity Assessment: RLE deficits/detail;LLE deficits/detail         Communication   Communication: No difficulties  Cognition Arousal/Alertness: Awake/alert Behavior During Therapy: WFL for tasks assessed/performed Overall Cognitive Status: Within Functional Limits for tasks assessed                      General Comments      Exercises General Exercises - Lower Extremity Ankle Circles/Pumps: AROM;Both;10 reps;Seated Short Arc Quad: Strengthening;Both;5 reps;Seated      Assessment/Plan    PT Assessment All further PT needs can be met in the next venue of care  PT Diagnosis Abnormality of gait;Generalized weakness   PT Problem List Decreased strength;Decreased activity tolerance;Decreased balance;Decreased mobility;Decreased coordination;Decreased knowledge of use of DME;Impaired sensation  PT Treatment Interventions     PT Goals (Current goals can be found in the Care Plan section) Acute Rehab PT Goals Patient Stated Goal: Be on my own again PT Goal Formulation: With patient Time For Goal Achievement: 04/12/14 Potential to Achieve Goals: Good    Frequency     Barriers to discharge        Co-evaluation               End of Session Equipment Utilized During Treatment: Gait belt Activity Tolerance: Patient tolerated treatment well Patient left: in chair;with call bell/phone within reach;with chair alarm set Nurse Communication: Mobility status         Time: 4782-9562 PT Time Calculation (min) (ACUTE ONLY): 24 min   Charges:   PT Evaluation $Initial PT Evaluation Tier I: 1 Procedure PT Treatments $Gait Training: 8-22 mins   PT G CodesBerton Mount 03/29/2014, 9:10 AM Charlsie Merles, PT [phone]

## 2014-03-29 NOTE — Progress Notes (Signed)
CRITICAL VALUE ALERT  Critical value received:  Sodium = 119  Date of notification:  03-29-2014  Time of notification:  13:18  Critical value read back:Yes.    Nurse who received alert:  Genelle Bal, RN  MD notified (1st page):  [name] Beavers  Time of first page:  13:20  MD notified (2nd page):  Time of second page:  Responding MD:  [name] Beavers   Time MD responded:  14:23

## 2014-03-30 DIAGNOSIS — R001 Bradycardia, unspecified: Secondary | ICD-10-CM | POA: Diagnosis not present

## 2014-03-30 DIAGNOSIS — H4011X0 Primary open-angle glaucoma, stage unspecified: Secondary | ICD-10-CM | POA: Diagnosis not present

## 2014-03-30 DIAGNOSIS — K219 Gastro-esophageal reflux disease without esophagitis: Secondary | ICD-10-CM | POA: Diagnosis not present

## 2014-03-30 DIAGNOSIS — M6281 Muscle weakness (generalized): Secondary | ICD-10-CM | POA: Diagnosis not present

## 2014-03-30 DIAGNOSIS — I679 Cerebrovascular disease, unspecified: Secondary | ICD-10-CM | POA: Diagnosis not present

## 2014-03-30 DIAGNOSIS — E871 Hypo-osmolality and hyponatremia: Secondary | ICD-10-CM

## 2014-03-30 DIAGNOSIS — I482 Chronic atrial fibrillation: Secondary | ICD-10-CM | POA: Diagnosis not present

## 2014-03-30 DIAGNOSIS — E0829 Diabetes mellitus due to underlying condition with other diabetic kidney complication: Secondary | ICD-10-CM | POA: Diagnosis not present

## 2014-03-30 DIAGNOSIS — G629 Polyneuropathy, unspecified: Secondary | ICD-10-CM | POA: Diagnosis not present

## 2014-03-30 DIAGNOSIS — H409 Unspecified glaucoma: Secondary | ICD-10-CM | POA: Diagnosis not present

## 2014-03-30 DIAGNOSIS — K210 Gastro-esophageal reflux disease with esophagitis: Secondary | ICD-10-CM | POA: Diagnosis not present

## 2014-03-30 DIAGNOSIS — E222 Syndrome of inappropriate secretion of antidiuretic hormone: Secondary | ICD-10-CM | POA: Diagnosis not present

## 2014-03-30 DIAGNOSIS — E039 Hypothyroidism, unspecified: Secondary | ICD-10-CM | POA: Diagnosis not present

## 2014-03-30 DIAGNOSIS — K3184 Gastroparesis: Secondary | ICD-10-CM | POA: Diagnosis not present

## 2014-03-30 DIAGNOSIS — Z8669 Personal history of other diseases of the nervous system and sense organs: Secondary | ICD-10-CM | POA: Diagnosis not present

## 2014-03-30 DIAGNOSIS — I10 Essential (primary) hypertension: Secondary | ICD-10-CM | POA: Diagnosis not present

## 2014-03-30 DIAGNOSIS — E1143 Type 2 diabetes mellitus with diabetic autonomic (poly)neuropathy: Secondary | ICD-10-CM | POA: Diagnosis not present

## 2014-03-30 DIAGNOSIS — R079 Chest pain, unspecified: Secondary | ICD-10-CM | POA: Diagnosis not present

## 2014-03-30 DIAGNOSIS — R300 Dysuria: Secondary | ICD-10-CM | POA: Diagnosis not present

## 2014-03-30 DIAGNOSIS — E785 Hyperlipidemia, unspecified: Secondary | ICD-10-CM | POA: Diagnosis not present

## 2014-03-30 DIAGNOSIS — R278 Other lack of coordination: Secondary | ICD-10-CM | POA: Diagnosis not present

## 2014-03-30 DIAGNOSIS — N390 Urinary tract infection, site not specified: Secondary | ICD-10-CM | POA: Diagnosis not present

## 2014-03-30 DIAGNOSIS — I248 Other forms of acute ischemic heart disease: Secondary | ICD-10-CM | POA: Diagnosis not present

## 2014-03-30 DIAGNOSIS — K311 Adult hypertrophic pyloric stenosis: Secondary | ICD-10-CM | POA: Diagnosis not present

## 2014-03-30 DIAGNOSIS — H04123 Dry eye syndrome of bilateral lacrimal glands: Secondary | ICD-10-CM | POA: Diagnosis not present

## 2014-03-30 DIAGNOSIS — K5901 Slow transit constipation: Secondary | ICD-10-CM | POA: Diagnosis not present

## 2014-03-30 DIAGNOSIS — R2681 Unsteadiness on feet: Secondary | ICD-10-CM | POA: Diagnosis not present

## 2014-03-30 DIAGNOSIS — Z961 Presence of intraocular lens: Secondary | ICD-10-CM | POA: Diagnosis not present

## 2014-03-30 DIAGNOSIS — E038 Other specified hypothyroidism: Secondary | ICD-10-CM | POA: Diagnosis not present

## 2014-03-30 DIAGNOSIS — I6930 Unspecified sequelae of cerebral infarction: Secondary | ICD-10-CM | POA: Diagnosis not present

## 2014-03-30 DIAGNOSIS — E1142 Type 2 diabetes mellitus with diabetic polyneuropathy: Secondary | ICD-10-CM | POA: Diagnosis not present

## 2014-03-30 DIAGNOSIS — F329 Major depressive disorder, single episode, unspecified: Secondary | ICD-10-CM | POA: Diagnosis not present

## 2014-03-30 DIAGNOSIS — R531 Weakness: Secondary | ICD-10-CM | POA: Diagnosis not present

## 2014-03-30 LAB — BASIC METABOLIC PANEL
Anion gap: 9 (ref 5–15)
Anion gap: 8 (ref 5–15)
BUN: 15 mg/dL (ref 6–23)
BUN: 14 mg/dL (ref 6–23)
CO2: 24 mmol/L (ref 19–32)
CO2: 25 mmol/L (ref 19–32)
Calcium: 9.2 mg/dL (ref 8.4–10.5)
Calcium: 9.4 mg/dL (ref 8.4–10.5)
Chloride: 91 mEq/L — ABNORMAL LOW (ref 96–112)
Chloride: 93 mEq/L — ABNORMAL LOW (ref 96–112)
Creatinine, Ser: 0.97 mg/dL (ref 0.50–1.10)
Creatinine, Ser: 0.93 mg/dL (ref 0.50–1.10)
GFR calc Af Amer: 59 mL/min — ABNORMAL LOW (ref >90)
GFR calc Af Amer: 62 mL/min — ABNORMAL LOW (ref >90)
GFR calc non Af Amer: 51 mL/min — ABNORMAL LOW (ref >90)
GFR calc non Af Amer: 53 mL/min — ABNORMAL LOW (ref >90)
Glucose, Bld: 187 mg/dL — ABNORMAL HIGH (ref 70–99)
Glucose, Bld: 177 mg/dL — ABNORMAL HIGH (ref 70–99)
Potassium: 3.8 mmol/L (ref 3.5–5.1)
Potassium: 3.7 mmol/L (ref 3.5–5.1)
Sodium: 124 mmol/L — ABNORMAL LOW (ref 135–145)
Sodium: 126 mmol/L — ABNORMAL LOW (ref 135–145)

## 2014-03-30 MED ORDER — BOOST / RESOURCE BREEZE PO LIQD: 1.0000 | Freq: Three times a day (TID) | ORAL | Status: DC

## 2014-03-30 MED ORDER — SODIUM CHLORIDE 1 G PO TABS: 1.0000 g | ORAL_TABLET | Freq: Three times a day (TID) | ORAL | Status: DC

## 2014-03-30 MED ORDER — METOPROLOL SUCCINATE 12.5 MG HALF TABLET: 12.5000 mg | ORAL_TABLET | Freq: Every day | ORAL | Status: DC

## 2014-03-30 NOTE — Clinical Social Work Note (Signed)
Patient will discharge to [facility] Anticipated discharge date:03/30/14 Family notified: family at bedside Transportation by [place] signing off.  Domenica Reamer, [place] Social Worker [phone]

## 2014-03-30 NOTE — Discharge Summary (Addendum)
Physician Discharge Summary  Shannon [name] MVH:[id number] DOB: October 22, 1923 DOA: 03/27/2014  PCP: Oneal Grout, MD  Admit date: 03/27/2014 Discharge date: 03/30/2014  Time spent: > 35 minutes  Recommendations for Outpatient Follow-up:  1. Please monitor Sodium levels  Discharge Diagnoses:  Active Problems:   Essential hypertension, benign   GERD (gastroesophageal reflux disease)   Dyslipidemia   CVA (cerebral vascular accident)   Hypothyroidism   Gastric outlet obstruction   Chest pain   Hyponatremia   Discharge Condition: stable  Diet recommendation: heart healthy diet  Filed Weights   03/27/14 2221  Weight: 67 kg (147 lb 11.3 oz)    History of present illness:  From original HPI: 80 y.o. 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 Almost at 8 meq sodium correction in a 24 hour period. - decrease sodium tablet dose to 1 gram tid with meals starting tomorrow  Procedures:  None  Consultations:  none  Discharge Exam: Filed Vitals:   03/30/14 1015  BP: 150/59  Pulse: 53  Temp: 97.5 F (36.4 C)  Resp: 16    General: Pt in nad, alert and awake Cardiovascular: rrr, no mrg Respiratory: cta bl, no wheezes  Discharge  Instructions   Discharge Instructions    Call MD for:  redness, tenderness, or signs of infection (pain, swelling, redness, odor or green/yellow discharge around incision site)    Complete by:  As directed      Call MD for:  temperature >100.4    Complete by:  As directed      Diet - low sodium heart healthy    Complete by:  As directed      Discharge instructions    Complete by:  As directed   Please reassess sodium levels in the next 3-4 days     Increase activity slowly    Complete by:  As directed           Current Discharge Medication List    START taking these medications   Details  !! feeding supplement, RESOURCE BREEZE, (RESOURCE BREEZE) LIQD Take 1 Container by mouth 3 (three) times daily between meals. Refills: 0    sodium chloride 1 G tablet Take 1 tablet (1 g total) by mouth 3 (three) times daily with meals.     !! - Potential duplicate medications found. Please discuss with provider.    CONTINUE these medications which have NOT CHANGED   Details  acetaminophen (TYLENOL) 160 MG/5ML solution Take 20.3 mLs (650 mg total) by mouth every 6 (six) hours as needed for mild pain or fever (>=101). Qty: 120 mL, Refills: 0    amLODipine (NORVASC) 1 mg/mL SUSP oral suspension Take 10 mLs (10 mg total) by mouth daily. Qty: 100 mL, Refills: 0    antiseptic oral rinse (CPC / CETYLPYRIDINIUM CHLORIDE 0.05%) 0.05 % LIQD solution 7 mLs by Mouth  Rinse route 2 times daily at 12 noon and 4 pm. Qty: 44 mL, Refills: 0    aspirin EC 81 MG tablet Take 81 mg by mouth every morning.    atorvastatin (LIPITOR) 10 MG tablet Take 10 mg by mouth daily at 6 PM.    B Complex-C (B-COMPLEX WITH VITAMIN C) tablet Take 1 tablet by mouth 2 (two) times daily.    calcium-vitamin D (OSCAL WITH D) 500-200 MG-UNIT per tablet Take 1 tablet by mouth 2 (two) times daily.    chlorhexidine (PERIDEX) 0.12 % solution 15 mLs by Mouth Rinse route 2 (two) times daily. Qty: 120 mL, Refills: 0    cloNIDine  (CATAPRES - DOSED IN MG/24 HR) 0.3 mg/24hr patch Place 1 patch (0.3 mg total) onto the skin once a week. Qty: 4 patch, Refills: 12    cycloSPORINE (RESTASIS) 0.05 % ophthalmic emulsion Place 1 drop into both eyes 2 (two) times daily.    fluticasone (FLONASE) 50 MCG/ACT nasal spray Place 2 sprays into both nostrils daily.    gabapentin (NEURONTIN) 300 MG capsule Take 300 mg by mouth 3 (three) times daily.    hydrALAZINE (APRESOLINE) 100 MG tablet Take 100 mg by mouth 3 (three) times daily.    isosorbide mononitrate (IMDUR) 60 MG 24 hr tablet Take 60 mg by mouth every morning.    l-methylfolate-B6-B12 (METANX) 3-35-2 MG TABS Take 1 tablet by mouth every morning.    !! lactose free nutrition (BOOST PLUS) LIQD Take 237 mLs by mouth 2 (two) times daily between meals. Qty: 237 mL, Refills: 0    latanoprost (XALATAN) 0.005 % ophthalmic solution Place 1 drop into the right eye at bedtime. Qty: 2.5 mL, Refills: 12    levothyroxine (SYNTHROID) 200 MCG tablet Take 1 tablet (200 mcg total) by mouth daily before breakfast. Qty: 30 tablet, Refills: 0    metoprolol succinate (TOPROL XL) 50 MG 24 hr tablet Take 1 tablet (50 mg total) by mouth daily. Take with or immediately following a meal. Qty: 30 tablet, Refills: 0    Multiple Vitamins-Minerals (MULTIVITAMIN WITH MINERALS) tablet Take 1 tablet by mouth every morning.     nitroGLYCERIN (NITRODUR - DOSED IN MG/24 HR) 0.2 mg/hr patch Place 1 patch (0.2 mg total) onto the skin daily. Qty: 30 patch, Refills: 12    ondansetron (ZOFRAN) 4 MG tablet Take 1 tablet (4 mg total) by mouth every 6 (six) hours as needed for nausea. Qty: 20 tablet, Refills: 0    pantoprazole (PROTONIX) 40 MG tablet Take 1 tablet (40 mg total) by mouth 2 (two) times daily. Qty: 60 tablet, Refills: 0    polyethylene glycol (MIRALAX / GLYCOLAX) packet Take 17 g by mouth daily as needed for mild constipation.    polyvinyl alcohol (LIQUIFILM TEARS) 1.4 % ophthalmic solution  Place 1 drop into both eyes as needed for dry eyes. Qty: 15 mL, Refills: 0    sennosides-docusate sodium (SENOKOT-S) 8.6-50 MG tablet Take 1 tablet by mouth 2 (two) times daily.     sertraline (ZOLOFT) 100 MG tablet Take 100 mg by mouth every morning.     sucralfate (CARAFATE) 1 G tablet Take 1 g by mouth 3 (three) times daily with meals.    traMADol (ULTRAM) 50 MG tablet Take 50 mg by mouth 2 (two) times daily as needed for moderate pain.      !! - Potential duplicate medications found. Please discuss with provider.    STOP taking these medications     cloNIDine (  CATAPRES) 0.3 MG tablet        No Known Allergies    The results of significant diagnostics from this hospitalization (including imaging, microbiology, ancillary and laboratory) are listed below for reference.    Significant Diagnostic Studies: Ct Abdomen Pelvis Wo Contrast  03/12/2014   CLINICAL DATA:  Constant vomiting today, constipated since Saturday, RIGHT lower quadrant rectal pain, history diabetes, hypertension, prior gastric bezoar  EXAM: CT ABDOMEN AND PELVIS WITHOUT CONTRAST  TECHNIQUE: Multidetector CT imaging of the abdomen and pelvis was performed following the standard protocol without IV contrast. Sagittal and coronal MPR images reconstructed from axial data set.  COMPARISON:  03/07/2014  FINDINGS: Minimal bibasilar atelectasis.  Low-attenuation foci within liver, question cysts, unchanged.  Metallic foreign body at LEFT ventricle again identified, uncertain etiology.  Gallbladder surgically absent.  Probable tiny LEFT renal cyst 9 mm diameter.  Within limits of a nonenhanced exam, remainder of liver, spleen, pancreas, kidneys and adrenal glands normal appearance.  Persistent marked thickening of the pyloric channel and pre pyloric antrum gastric distention question partial gastric outlet obstruction.  Small amount of ingested contrast is present within proximal small bowel loops.  Large filling defect within the  stomach, 12.2 x 10.0 x 15.2 cm, containing bubbly gas, consistent with recurrent bezoar.  Scattered colonic diverticula.  No evidence of bowel obstruction or dilatation.  Extensive atherosclerotic calcifications aorta and minimally in coronary arteries.  High attenuation nodule 13 mm diameter again identified adjacent to the thickened gastroesophageal junction, unchanged but since 03/07/2014 but new since 01/17/2014, question contrast within epiphrenic diverticulum or small focal contained perforation, unlikely calcified lymph node since this is new in past 2 months.  Unremarkable bladder, ureters, uterus and adnexae.  Appendix not visualized.  No definite rectal wall thickening.  No additional mass, adenopathy, free fluid, free air or abscess.  Bones demineralized with scattered degenerative changes.  Surgical scar at ventral abdomen with a small incisional hernia containing a portion of transverse colon.  IMPRESSION: Thickened pylorus and pre-pyloric antrum likely causing partial outlet obstruction.  Large filling defect within stomach consistent with recurrent bezoar 12.2 x 10.0 x 15.2 cm.  Colonic diverticulosis.  Suspected hepatic cysts unchanged.  13 mm high attenuation nodule adjacent to thickened gastroesophageal junction, question contained perforation versus epiphrenic diverticulum containing contrast, new since 01/17/2014 but unchanged since 03/07/2014.  Small incisional hernia containing a portion of the transverse colon wall without obstruction.   Electronically Signed   By: Ulyses Southward M.D.   On: 03/12/2014 23:12   Dg Chest 2 View  03/27/2014   CLINICAL DATA:  Acute chest discomfort  EXAM: CHEST - 2 VIEW  COMPARISON:  03/14/2014  FINDINGS: Chronic elevation of the right hemidiaphragm. Small left effusion noted with left basilar compressive atelectasis suspected. Mild cardiomegaly without superimposed edema or CHF. No pneumothorax. Upper lobes remain clear. Trachea is midline. Atherosclerosis noted  of the aorta. Degenerative changes of the spine and shoulders.  IMPRESSION: Cardiomegaly without CHF  Small left effusion with left base compressive atelectasis  Chronic right hemidiaphragm elevation   Electronically Signed   By: Ruel Favors M.D.   On: 03/27/2014 15:43   Ct Head Wo Contrast  03/22/2014   CLINICAL DATA:  Acute onset of slurred speech.  Initial encounter.  EXAM: CT HEAD WITHOUT CONTRAST  TECHNIQUE: Contiguous axial images were obtained from the base of the skull through the vertex without intravenous contrast.  COMPARISON:  CT of the head performed 12/23/2013, and MRI of the brain performed  12/24/2013  FINDINGS: There is no evidence of acute infarction, mass lesion, or intra- or extra-axial hemorrhage on CT.  Prominence of the ventricles and sulci reflects mild to moderate cortical volume loss. Cerebellar atrophy is noted. Scattered periventricular and subcortical white matter change likely reflects small vessel ischemic microangiopathy. A chronic infarct is noted at the left cerebellar hemisphere.  The brainstem and fourth ventricle are within normal limits. The basal ganglia are unremarkable in appearance. The cerebral hemispheres demonstrate grossly normal gray-white differentiation. No mass effect or midline shift is seen.  There is no evidence of fracture; visualized osseous structures are unremarkable in appearance. The visualized portions of the orbits are within normal limits. The paranasal sinuses and mastoid air cells are well-aerated. No significant soft tissue abnormalities are seen.  IMPRESSION: 1. No acute intracranial pathology seen on CT. 2. Mild to moderate cortical volume loss and scattered small vessel ischemic microangiopathy. 3. 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   Electronically Signed   By: Roanna Raider M.D.   On: 03/22/2014 03:30   Mr Brain Wo Contrast  03/23/2014   CLINICAL DATA:  Acute onset of slurred speech. Previous history of CVA.  EXAM: MRI  HEAD WITHOUT CONTRAST  TECHNIQUE: Multiplanar, multiecho pulse sequences of the brain and surrounding structures were obtained without intravenous contrast.  COMPARISON:  MR brain 12/24/2013.  CT head 03/22/2014.  FINDINGS: No evidence for acute infarction, hemorrhage, mass lesion, hydrocephalus, or extra-axial fluid. Generalized cerebral and cerebellar atrophy. Extensive T2 and FLAIR hyperintensities throughout the periventricular and subcortical white matter, consistent with small vessel disease. Flow voids are maintained throughout the carotid, basilar, and vertebral arteries. There are no areas of chronic hemorrhage. Pituitary, pineal, and cerebellar tonsils unremarkable. No upper cervical lesions. Mild pannus, with mild cervical spondylosis. Chronic LEFT cerebellar infarct. Chronic BILATERAL thalamic infarcts. Tiny chronic microhemorrhages in the brain, better appreciated on the previous 3T scan at [facility]. No acute sinus or mastoid disease. Negative orbits. Scalp soft tissues unremarkable.  IMPRESSION: Chronic changes as described. No acute intracranial abnormality. Similar appearance to prior MR.   Electronically Signed   By: Davonna Belling M.D.   On: 03/23/2014 16:22   Ct Abdomen Pelvis W Contrast  03/07/2014   CLINICAL DATA:  80 year old female presenting with rectal pressure gradually worsening over the prior month.  EXAM: CT ABDOMEN AND PELVIS WITH CONTRAST  TECHNIQUE: Multidetector CT imaging of the abdomen and pelvis was performed using the standard protocol following bolus administration of intravenous contrast.  CONTRAST:  50mL OMNIPAQUE IOHEXOL 300 MG/ML SOLN, 80mL OMNIPAQUE IOHEXOL 300 MG/ML SOLN  COMPARISON:  CT of the abdomen and pelvis 01/17/2014.  FINDINGS: Lower chest: Cardiomegaly with biatrial dilatation. Calcifications of the aortic valve. Atherosclerotic calcifications in the left main and left circumflex coronary arteries. Small hiatal hernia.  Hepatobiliary: Multifocal  low-attenuation lesions appear unchanged in size, number and distribution scattered throughout the liver. Many these are too small to definitively characterize, while the larger lesions are intermediate attenuation, favored to represent proteinaceous cysts. The largest of these lesions measures only 12 mm in segment 2 (image 21 of series 2). No new hepatic lesions are noted. No intra or extrahepatic biliary ductal dilatation. Status post cholecystectomy.  Pancreas: Unremarkable.  Spleen: Unremarkable.  Adrenals/Urinary Tract: Again noted is a 1.2 cm intermediate attenuation (23 HU) lesion in the lower pole of the left kidney, with some surrounding architectural distortion, favored to represent a previously ruptured proteinaceous cyst. No suspicious renal lesions are noted. Multifocal cortical  thinning in the kidneys bilaterally, presumably mild scarring. No hydroureteronephrosis. Urinary bladder is normal in appearance. Bilateral adrenal glands are normal in appearance.  Stomach/Bowel: As with the prior examination there is extensive thickening of the gastric wall in the antral pre-pyloric region of the stomach. The pylorus is also markedly thickened (up to 15 mm in thickness). The stomach is moderately distended. No pathologic dilatation of small bowel or colon. Postoperative changes of partial colectomy near the rectosigmoid junction are noted. A few scattered colonic diverticulae are noted, without surrounding inflammatory changes to suggest an acute diverticulitis at this time. The distal rectum and anus are grossly unremarkable in appearance.  Vascular/Lymphatic: Extensive atherosclerosis throughout the abdominal and pelvic vasculature, without evidence of aneurysm or dissection. No lymphadenopathy noted in the abdomen or pelvis.  Reproductive: Retroverted uterus.  Ovaries are atrophic.  Other: No significant volume of ascites.  No pneumoperitoneum.  Musculoskeletal: Tiny ventral hernia, through which there is  incomplete herniation of the mid transverse colon; no findings to suggest associated bowel incarceration or obstruction at this time. Old compression fracture of superior endplate of T11 with approximately 20% loss of anterior vertebral body height (unchanged). There are no aggressive appearing lytic or blastic lesions noted in the visualized portions of the skeleton.  IMPRESSION: 1. No findings to account for the patient's worsening rectal pressure. 2. There is persistent thickening of the pylorus and antral pre-pyloric region of the stomach, which could be inflammatory in the setting of ulcer disease, or could be neoplastic. There is persistent moderate dilatation of the stomach, suggesting some degree of gastric outlet obstruction. Correlation with results of recent upper endoscopy is suggested. 3. Additional incidental findings, as above, similar to prior examination.   Electronically Signed   By: Trudie Reed M.D.   On: 03/07/2014 16:28   Dg Chest Port 1 View  03/14/2014   CLINICAL DATA:  80 year old female with new onset chest pain beginning today  EXAM: PORTABLE CHEST - 1 VIEW  COMPARISON:  None.  FINDINGS: Cardiomegaly. Atherosclerotic calcification present in the transverse aorta. No focal airspace consolidation, pulmonary edema, pneumothorax. a query trace left pleural effusion. No acute osseous abnormality. The left glenohumeral joint osteoarthritis. Mild coarsening of the interstitial markings.  IMPRESSION: 1. Query trace left pleural effusion. 2. Cardiomegaly. 3. Aortic atherosclerosis.   Electronically Signed   By: Malachy Moan M.D.   On: 03/14/2014 10:12   Dg Abd Portable 1v  03/15/2014   CLINICAL DATA:  Nasogastric tube placement.  EXAM: PORTABLE ABDOMEN - 1 VIEW  COMPARISON:  03/14/2014  FINDINGS: The nasogastric tube is in place, tip coiled within the stomach and tip overlying the region of the fundus. Surgical clips are noted in the right upper quadrant. There is residual contrast  in the colon and distal small bowel following esophagram on 03/13/2014. Bowel gas pattern is nonobstructive.  IMPRESSION: Nasogastric tube coiled within the stomach.   Electronically Signed   By: Rosalie Gums M.D.   On: 03/15/2014 10:49   Dg Abd Portable 2v  03/14/2014   CLINICAL DATA:  80 year old female with persistent abdominal pain and fullness.  EXAM: PORTABLE ABDOMEN - 2 VIEW  COMPARISON:  Esophagram performed yesterday. CT abdomen/ pelvis 03/12/2014  FINDINGS: No evidence of free air on the lateral decubitus view. Contrast material is noted within the stomach and scattered throughout the large bowel. There is no evidence of obstruction. The volume of contrast material remaining within the stomach is greater than expected. The bones are diffusely osteopenic. Surgical  clips in the right upper quadrant suggest prior cholecystectomy.  IMPRESSION: 1. No evidence of obstruction or free air. 2. Greater than expected volume of retained oral contrast within the stomach. Query clinical history of delayed gastric emptying or gastroparesis? 3. The bones appear osteopenic.   Electronically Signed   By: Malachy Moan M.D.   On: 03/14/2014 10:04   Dg Ugi W/small Bowel  03/25/2014   CLINICAL DATA:  Nausea and vomiting; history of bezoar.  EXAM: UPPER GI SERIES WITH SMALL BOWEL FOLLOW-THROUGH using thin barium.  FLUOROSCOPY TIME:  1 min, 17 seconds  TECHNIQUE: Combined double contrast and single contrast upper GI series using effervescent crystals, thick barium, and thin barium. Subsequently, serial images of the small bowel were obtained including spot views of the terminal ileum.  COMPARISON:  Abdominal film of March 15, 2014.  FINDINGS: Scout films from 22 December and today were reviewed. A moderate stool burden in the right colon was noted. Scattered diverticula containing radiodense contrast were noted in the left colon.  The patient ingested the barium without difficulty. The thoracic esophagus  demonstrated mild changes of presbyesophagus but no evidence of a stricture. There was small non reducible hiatal hernia. The stomach distended reasonably well. The rugal folds were mildly prominent. No ulcer niche was demonstrated. No bezoar was demonstrated. Gastric emptying was prompt. The duodenal bulb and C sweep were normally positioned. Barium moved into the proximal jejunum within 10 min of ingestion of barium preparations.  Over the course of 3 hr and 35 min the barium traversed the small bowel. The jejunum demonstrated a normal feathery mucosal pattern. The ileum demonstrated a normal pattern as well. The terminal ileum was observed fluoroscopically and was normal. The patient voiced no focal tenderness to palpation over the abdomen.  IMPRESSION: 1. No residual bezoar is demonstrated. There is a small hiatal hernia and the rugal folds of the stomach are mildly prominent. Gastric emptying is normal. 2. The duodenum, jejunum, and ileum demonstrate no acute abnormality. There is no evidence of obstruction.   Electronically Signed   By: David  [place]   On: 03/25/2014 15:47   Dg Esophagus W/water Sol Cm  03/13/2014   CLINICAL DATA:  Abnormal CT, evaluate for esophageal perforation  EXAM: ESOPHOGRAM/BARIUM SWALLOW  TECHNIQUE: Single contrast examination was performed using water-soluble contrast.  FLUOROSCOPY TIME:  1 min 7 seconds  COMPARISON:  CT abdomen pelvis dated 03/12/2014 and 03/07/2014  FINDINGS: Esophageal dysmotility.  No fixed esophageal narrowing/stricture.  Suspected small paraesophageal hernia (less likely hiatal hernia or esophageal diverticulum) at the GE junction. The appearance does not suggest a contained esophageal perforation.  Presence of gastroesophageal reflux could not be assessed due to the patient's inability to clear her esophagus.  Moderate food/debris in the stomach.  IMPRESSION: Esophageal dysmotility.  No fixed esophageal narrowing/stricture.  Suspected small paraesophageal  hernia at the GE junction. No evidence of contained esophageal perforation.  Moderate food/debris in the stomach.   Electronically Signed   By: Charline Bills M.D.   On: 03/13/2014 09:44    Microbiology: Recent Results (from the past 240 hour(s))  Urine culture     Status: None   Collection Time: 03/22/14 10:00 AM  Result Value Ref Range Status   Specimen Description URINE, CATHETERIZED  Final   Special Requests NONE  Final   Culture  Setup Time   Final    03/22/2014 16:51 Performed at [facility]    Colony Count NO GROWTH Performed at [facility]  Final   Culture NO GROWTH Performed at [facility]   Final   Report Status 03/23/2014 FINAL  Final     Labs: Basic Metabolic Panel:  Recent Labs Lab 03/29/14 0514 03/29/14 1224 03/29/14 1941 03/30/14 0300 03/30/14 1146  NA 120* 119* 123* 124* 126*  K 4.0 4.0 4.1 3.8 3.7  CL 86* 86* 91* 91* 93*  CO2 27 25 26 24 25   GLUCOSE 160* 125* 142* 187* 177*  BUN 14 16 16 15 14   CREATININE 1.11* 1.06 0.97 0.97 0.93  CALCIUM 9.5 9.5 9.4 9.2 9.4   Liver Function Tests: No results for input(s): AST, ALT, ALKPHOS, BILITOT, PROT, ALBUMIN in the last 168 hours. No results for input(s): LIPASE, AMYLASE in the last 168 hours. No results for input(s): AMMONIA in the last 168 hours. CBC:  Recent Labs Lab 03/24/14 0650 03/26/14 0409 03/27/14 1445 03/27/14 1831  WBC 5.9 4.7 4.1 4.5  HGB 10.2* 10.3* 10.4* 10.2*  HCT 29.4* 29.4* 29.4* 28.4*  MCV 87.2 86.7 83.8 84.0  PLT 321 317 316 308   Cardiac Enzymes:  Recent Labs Lab 03/27/14 1831 03/27/14 2007 03/27/14 2345  TROPONINI <0.03 <0.03 <0.03   BNP: BNP (last 3 results)  Recent Labs  01/17/14 2116  PROBNP 961.6*   CBG: No results for input(s): GLUCAP in the last 168 hours.     Signed:  Penny Pia  [facility] 03/30/2014, 1:29 PM  Addendum: Due to bradycardia will decrease B blocker dose and discontinue clonidine.

## 2014-03-30 NOTE — Evaluation (Signed)
Occupational Therapy Evaluation Patient Details Name: Shannon [name] MRN: [id number] DOB: 1923/04/16 Today's Date: 03/30/2014    History of Present Illness Shannon [name]  is a 80 y.o. female, with past medical history of hypothyroidism, seizure, CVA, hyperlipidemia, hypertension, diabetes, gastric outlet obstruction, who was discharged yesterday from [facility] secondary to diagnosis of gastric outlet obstruction, which she has been seen by GI and surgery with recommendation of clear liquid diet, had hypertensive urgency then as well, with hyponatremia workup significant for SIADH, patient presents to ED with complaints of chest pain radiating to left arm, had 2 episodes each lasting 10 minutes, denies any shortness of breath, nausea, vomiting, diaphoresis with that episode, patient had negative troponins.   Clinical Impression   Pt admitted with CP. Pt currently with functional limitations due to the deficits listed below (see OT Problem List).  Pt will benefit from skilled OT to increase their safety and independence with ADL and functional mobility for ADL to facilitate discharge to venue listed below.      Follow Up Recommendations  SNF;Supervision/Assistance - 24 hour    Equipment Recommendations  None recommended by OT       Precautions / Restrictions Precautions Precautions: Fall      Mobility Bed Mobility Overal bed mobility: Needs Assistance Bed Mobility: Supine to Sit     Supine to sit: HOB elevated;Supervision     General bed mobility comments: Supervision for safety. Moderate use of rail. requires extra time.  Transfers Overall transfer level: Needs assistance Equipment used: 1 person hand held assist Transfers: Sit to/from Stand Sit to Stand: Min assist Stand pivot transfers: Mod assist            Balance     Sitting balance-Leahy Scale: Good       Standing balance-Leahy Scale: Fair                              ADL Overall ADL's  : Needs assistance/impaired Eating/Feeding: Independent;Sitting   Grooming: Wash/dry hands;Set up;Sitting   Upper Body Bathing: Set up;Sitting   Lower Body Bathing: Minimal assistance;Sit to/from stand   Upper Body Dressing : Set up;Sitting   Lower Body Dressing: Minimal assistance;Sit to/from stand   Toilet Transfer: Minimal assistance;Stand-pivot;BSC   Toileting- Architect and Hygiene: Sit to/from stand;Moderate assistance                         Pertinent Vitals/Pain Pain Assessment: No/denies pain     Hand Dominance Right   Extremity/Trunk Assessment Upper Extremity Assessment Upper Extremity Assessment: Generalized weakness           Communication Communication Communication: No difficulties   Cognition Arousal/Alertness: Awake/alert Behavior During Therapy: WFL for tasks assessed/performed Overall Cognitive Status: Within Functional Limits for tasks assessed                     General Comments       Exercises       Shoulder Instructions      Home Living Family/patient expects to be discharged to:: Skilled nursing facility Living Arrangements: Children Available Help at Discharge: Family;Available 24 hours/day Type of Home: House Home Access: Ramped entrance     Home Layout: One level               Home Equipment: Walker - 2 wheels;Cane - single point;Shower seat   Additional Comments: Hx recently taken  at [name] long. Confirmed by pt today.      Prior Functioning/Environment Level of Independence: Independent with assistive device(s)        Comments: pt states she does her own bathing/dressing at daughters house. She states she has a 3in1 but it is at her home and she has been living with daughter    OT Diagnosis: Generalized weakness   OT Problem List: Decreased strength;Decreased knowledge of use of DME or AE   OT Treatment/Interventions: Self-care/ADL training;Patient/family education;Therapeutic  activities;DME and/or AE instruction    OT Goals(Current goals can be found in the care plan section) Acute Rehab OT Goals Patient Stated Goal: Be on my own again OT Goal Formulation: With patient Time For Goal Achievement: 04/06/14  OT Frequency: Min 2X/week              End of Session Equipment Utilized During Treatment: Gait belt Nurse Communication: Mobility status  Activity Tolerance: Patient tolerated treatment well Patient left: in chair;with call bell/phone within reach;with chair alarm set   Time: 0950-1020 OT Time Calculation (min): 30 min Charges:  OT General Charges $OT Visit: 1 Procedure OT Evaluation $Initial OT Evaluation Tier I: 1 Procedure OT Treatments $Self Care/Home Management : 8-22 mins G-Codes:    Einar Crow D 04-20-2014, 10:27 AM

## 2014-03-30 NOTE — Progress Notes (Signed)
PTAR to transfer pt back to [facility]. Pt left floor via stretcher by ambulance.

## 2014-03-31 ENCOUNTER — Non-Acute Institutional Stay (SKILLED_NURSING_FACILITY): Payer: Medicare Other | Admitting: Registered Nurse

## 2014-03-31 ENCOUNTER — Encounter: Payer: Self-pay | Admitting: Registered Nurse

## 2014-03-31 DIAGNOSIS — K5901 Slow transit constipation: Secondary | ICD-10-CM

## 2014-03-31 DIAGNOSIS — E871 Hypo-osmolality and hyponatremia: Secondary | ICD-10-CM | POA: Diagnosis not present

## 2014-03-31 DIAGNOSIS — M6281 Muscle weakness (generalized): Secondary | ICD-10-CM | POA: Diagnosis not present

## 2014-03-31 DIAGNOSIS — F329 Major depressive disorder, single episode, unspecified: Secondary | ICD-10-CM

## 2014-03-31 DIAGNOSIS — K311 Adult hypertrophic pyloric stenosis: Secondary | ICD-10-CM | POA: Diagnosis not present

## 2014-03-31 DIAGNOSIS — E785 Hyperlipidemia, unspecified: Secondary | ICD-10-CM

## 2014-03-31 DIAGNOSIS — K2100 Gastro-esophageal reflux disease with esophagitis, without bleeding: Secondary | ICD-10-CM

## 2014-03-31 DIAGNOSIS — F32A Depression, unspecified: Secondary | ICD-10-CM

## 2014-03-31 DIAGNOSIS — E1142 Type 2 diabetes mellitus with diabetic polyneuropathy: Secondary | ICD-10-CM

## 2014-03-31 DIAGNOSIS — R300 Dysuria: Secondary | ICD-10-CM

## 2014-03-31 DIAGNOSIS — I10 Essential (primary) hypertension: Secondary | ICD-10-CM | POA: Diagnosis not present

## 2014-03-31 DIAGNOSIS — E039 Hypothyroidism, unspecified: Secondary | ICD-10-CM | POA: Diagnosis not present

## 2014-03-31 DIAGNOSIS — G629 Polyneuropathy, unspecified: Secondary | ICD-10-CM

## 2014-03-31 LAB — CBC AND DIFFERENTIAL
HCT: 29 % — AB (ref 36–46)
Hemoglobin: 10.2 g/dL — AB (ref 12.0–16.0)
Platelets: 289 10*3/uL (ref 150–399)
WBC: 5.5 10^3/mL

## 2014-03-31 LAB — BASIC METABOLIC PANEL
BUN: 14 mg/dL (ref 4–21)
Creatinine: 0.9 mg/dL (ref 0.5–1.1)
Glucose: 128 mg/dL
Potassium: 4.4 mmol/L (ref 3.4–5.3)
Sodium: 127 mmol/L — AB (ref 137–147)

## 2014-03-31 NOTE — Progress Notes (Signed)
Patient ID: Shannon [name], female   DOB: May 02, 1923, 80 y.o.   MRN: [id number]    Place of Service: [facility]  No Known Allergies  Code Status: DNR  Goals of Care: Comfort and Quality of Life/STR  Chief Complaint  Patient presents with  . Hospitalization Follow-up    HPI 80 y.o. female with PMH of hypothyroidism, seizure, CVA, HLD, HTN, DM2 among others is being seen for a follow-up visit post hospital admission from 03/27/14 to 03/30/14 for chest pain thought to be GI in origin as troponins were neagative. Prior to this, patient was here for STR post hospital admission from 03/12/14 to 03/26/14 for gastric outlet obstruction, accelerated HTN, and hyponatremia significant for [facility]. Seen in room today. Daughter at bedside. Reported dysuria with urinary urgency and frequency x 1 day. No additional complaints verbalized. BP readings elevated: 160s-240s/80s-100s. Most readings are 180s-190s/80s. Per daughter, her BP is normally around 150-160s/80s.  Review of Systems Constitutional: Negative for fever and chills. HENT: Negative for ear pain, congestion, and sore throat Eyes: Negative for eye pain, eye discharge, and visual disturbance  Cardiovascular: Negative for chest pain, palpitations, and leg swelling Respiratory: Negative cough, shortness of breath, and wheezing.  Gastrointestinal: Negative for nausea and vomiting. Negative for abdominal pain, diarrhea and constipation.  Genitourinary: Positive for  dysuria, frequency, urgency. Negative hematuria Endocrine: Negative for polydipsia, polyphagia, and polyuria Musculoskeletal: Negative for back pain, joint pain, and joint swelling  Neurological: Negative for dizziness, headache, and tremors. Positive for generalized muscle weakness and neuropathic pain of BLE.  Skin: Negative for rash and wound.   Psychiatric: Negative for depression  Past Medical History  Diagnosis Date  . Pneumonia   . Thyroid disease   . Seizures   .  Stroke   . Hyperlipidemia   . Hypertension   . Diabetes mellitus without complication   . Small bowel obstruction   . Ventral hernia with bowel obstruction     Past Surgical History  Procedure Laterality Date  . Exploratory laparotomy  09-07-2013  . Cholecystectomy    . Tubal ligation    . Colectomy    . Hernia repair  2015  . Esophagogastroduodenoscopy N/A 01/19/2014    Procedure: ESOPHAGOGASTRODUODENOSCOPY (EGD);  Surgeon: Hilarie Fredrickson, MD;  Location: [facility];  Service: Endoscopy;  Laterality: N/A;  . Esophagogastroduodenoscopy N/A 01/20/2014    Procedure: ESOPHAGOGASTRODUODENOSCOPY (EGD);  Surgeon: Hilarie Fredrickson, MD;  Location: [facility];  Service: Endoscopy;  Laterality: N/A;  . Esophagogastroduodenoscopy N/A 03/19/2014    Procedure: ESOPHAGOGASTRODUODENOSCOPY (EGD);  Surgeon: Rachael Fee, MD;  Location: [facility];  Service: Endoscopy;  Laterality: N/A;    History  Substance Use Topics  . Smoking status: Never Smoker   . Smokeless tobacco: Not on file  . Alcohol Use: No    Family History  Problem Relation Age of Onset  . Stroke Mother   . Heart attack Neg Hx   . Hypertension Mother   . Diabetes Brother       Medication List       This list is accurate as of: 03/31/14 11:59 PM.  Always use your most recent med list.               acetaminophen 160 MG/5ML solution  Commonly known as:  TYLENOL  Take 20.3 mLs (650 mg total) by mouth every 6 (six) hours as needed for mild pain or fever (>=101).     amLODipine 1 mg/mL Susp oral suspension  Commonly  known as:  NORVASC  Take 10 mLs (10 mg total) by mouth daily.     antiseptic oral rinse 0.05 % Liqd solution  Commonly known as:  CPC / CETYLPYRIDINIUM CHLORIDE 0.05%  7 mLs by Mouth Rinse route 2 times daily at 12 noon and 4 pm.     aspirin EC 81 MG tablet  Take 81 mg by mouth every morning.     atorvastatin 10 MG tablet  Commonly known as:  LIPITOR  Take 10 mg by mouth daily at 6 PM.      B-complex with vitamin C tablet  Take 1 tablet by mouth 2 (two) times daily.     calcium-vitamin D 500-200 MG-UNIT per tablet  Commonly known as:  OSCAL WITH D  Take 1 tablet by mouth 2 (two) times daily.     chlorhexidine 0.12 % solution  Commonly known as:  PERIDEX  15 mLs by Mouth Rinse route 2 (two) times daily.     cloNIDine 0.3 MG tablet  Commonly known as:  CATAPRES  Take 0.3 mg by mouth 3 (three) times daily.     cycloSPORINE 0.05 % ophthalmic emulsion  Commonly known as:  RESTASIS  Place 1 drop into both eyes 2 (two) times daily.     fluticasone 50 MCG/ACT nasal spray  Commonly known as:  FLONASE  Place 2 sprays into both nostrils daily.     gabapentin 300 MG capsule  Commonly known as:  NEURONTIN  Take 300 mg by mouth 3 (three) times daily.     hydrALAZINE 100 MG tablet  Commonly known as:  APRESOLINE  Take 100 mg by mouth 3 (three) times daily.     isosorbide mononitrate 60 MG 24 hr tablet  Commonly known as:  IMDUR  Take 60 mg by mouth every morning.     l-methylfolate-B6-B12 3-35-2 MG Tabs  Commonly known as:  METANX  Take 1 tablet by mouth every morning.     lactose free nutrition Liqd  Take 237 mLs by mouth 2 (two) times daily between meals.     feeding supplement (RESOURCE BREEZE) Liqd  Take 1 Container by mouth 3 (three) times daily between meals.     latanoprost 0.005 % ophthalmic solution  Commonly known as:  XALATAN  Place 1 drop into the right eye at bedtime.     levothyroxine 200 MCG tablet  Commonly known as:  SYNTHROID  Take 1 tablet (200 mcg total) by mouth daily before breakfast.     metoprolol succinate 50 MG 24 hr tablet  Commonly known as:  TOPROL-XL  Take 50 mg by mouth daily. Take with or immediately following a meal.     multivitamin with minerals tablet  Take 1 tablet by mouth every morning.     nitroGLYCERIN 0.2 mg/hr patch  Commonly known as:  NITRODUR - Dosed in mg/24 hr  Place 1 patch (0.2 mg total) onto the skin  daily.     ondansetron 4 MG tablet  Commonly known as:  ZOFRAN  Take 1 tablet (4 mg total) by mouth every 6 (six) hours as needed for nausea.     pantoprazole 40 MG tablet  Commonly known as:  PROTONIX  Take 1 tablet (40 mg total) by mouth 2 (two) times daily.     polyethylene glycol packet  Commonly known as:  MIRALAX / GLYCOLAX  Take 17 g by mouth daily as needed for mild constipation.     polyvinyl alcohol 1.4 % ophthalmic solution  Commonly  known as:  LIQUIFILM TEARS  Place 1 drop into both eyes as needed for dry eyes.     sennosides-docusate sodium 8.6-50 MG tablet  Commonly known as:  SENOKOT-S  Take 1 tablet by mouth 2 (two) times daily.     sertraline 100 MG tablet  Commonly known as:  ZOLOFT  Take 100 mg by mouth every morning.     sodium chloride 1 G tablet  Take 1 tablet (1 g total) by mouth 3 (three) times daily with meals.     sucralfate 1 G tablet  Commonly known as:  CARAFATE  Take 1 g by mouth 3 (three) times daily with meals.        Physical Exam  BP 180/70 mmHg  Pulse 67  Temp(Src) 97.6 F (36.4 C)  Resp 20  Ht 5\' 6"  (1.676 m)  Wt 137 lb 12.8 oz (62.506 kg)  BMI 22.25 kg/m2  SpO2 94%  Constitutional: thin elderly female in no acute distress. Conversant and pleasant HEENT: Normocephalic and atraumatic. PERRL. EOM intact. No icterus. Eyeglasses in place. No nasal discharge or sinus tenderness. Oral mucosa moist. Posterior pharynx clear of any exudate or lesions. Poor dentition Neck: Supple and nontender. No lymphadenopathy, masses, or thyromegaly. No JVD or carotid bruits. Cardiac: Normal S1, S2. RRR without appreciable murmurs, rubs, or gallops. Distal pulses intact. No dependent edema.  Lungs: No respiratory distress. Breath sounds clear bilaterally without rales, rhonchi, or wheezes. Abdomen: Audible bowel sounds in all quadrants. Soft, nontender, nondistended. No palpable mass.  Musculoskeletal: able to move all extremities with generalized  muscle weakness present. No joint erythema or tenderness. Skin: Warm and dry. No rash noted. No erythema.  Neurological: Alert and oriented to person, place, and time. No focal deficits.  Psychiatric:  Appropriate mood and affect.   Labs Reviewed  CBC Latest Ref Rng 03/31/2014 03/27/2014 03/27/2014  WBC - 5.5 4.5 4.1  Hemoglobin 12.0 - 16.0 g/dL 10.2(A) 10.2(L) 10.4(L)  Hematocrit 36 - 46 % 29(A) 28.4(L) 29.4(L)  Platelets 150 - 399 K/L 289 308 316    CMP Latest Ref Rng 03/31/2014 03/30/2014 03/30/2014  Glucose 70 - 99 mg/dL - 829(F) 621(H)  BUN 4 - 21 mg/dL 14 14 15   Creatinine 0.5 - 1.1 mg/dL 0.9 0.86 5.78  Sodium 469 - 147 mmol/L 127(A) 126(L) 124(L)  Potassium 3.4 - 5.3 mmol/L 4.4 3.7 3.8  Chloride 96 - 112 mEq/L - 93(L) 91(L)  CO2 19 - 32 mmol/L - 25 24  Calcium 8.4 - 10.5 mg/dL - 9.4 9.2  Total Protein 6.0 - 8.3 g/dL - - -  Albumin 3.5 - 4.7 g/dL - - -  Total Bilirubin 0.3 - 1.2 mg/dL - - -  Alkaline Phos 39 - 117 U/L - - -  AST 0 - 37 U/L - - -  ALT 0 - 35 U/L - - -    Lab Results  Component Value Date   HGBA1C 6.9* 12/24/2013    Lab Results  Component Value Date   TSH 27.660* 03/22/2014    Lipid Panel     Component Value Date/Time   CHOL 110 12/24/2013 0456   TRIG 130 12/24/2013 0456   HDL 58 12/24/2013 0456   CHOLHDL 1.9 12/24/2013 0456   VLDL 26 12/24/2013 0456   LDLCALC 26 12/24/2013 0456   Lab Results  Component Value Date   TROPONINI <0.03 03/27/2014    Diagnostic Studies Reviewed 03/12/14: CT abd: thickened pylorus and pre-pyloric antrum likely causing partial outlet obstruction.  Large filling defect w/in stomach consistent with recurrent bezoar 12.2x10x15.2cm. Colonic diverticulosis. Suspected hepatic cyst unchanged. Small incisional hernia containing portion of tranverse colon wall w/o obstruction. 13mm high attenuation nodule adjacent to thickened gastroesophageal junction, question contained perforation vs. Epiphrenic diverticulum containing  contrast.   03/22/14: Head CT: No acute intracranial pathology seen on CT. Mild to moderate cortical volume loss and scattered small vessel ischemic microangiopathy. Chronic infarct at left cerebellar hemisphere.   03/23/14: MRI head: No acute intracranial pathology.   Assessment & Plan 1. Hypertension, uncontrolled Uncontrolled. BP remains elevated with most readings in 180s/80s with range from 160s/80s to 240s/90s-100s. Continue amlodipine 10mg  daily, clonidine 0.3mg /24hr patch once a week, hydralazine 100mg  three times daily, toprol 50mg  daily, imdur 60mg  daily, and nitro 0.2mg /hr patch daily. Will restart clonidine 0.3mg  three time daily. Monitor VS every four hours. Continue to monitor her status  2. Gastroesophageal reflux disease with esophagitis Continue protonix 40mg  daily with carafate 1g three times daily with meals.   3. Gastric outlet obstruction No n/v. Tolerated her diet today, but would like to have pureed foods. Passing gas. Has BM yesterday. Will change her diet to Pureed consistency. Continue tylenol 650mg  every six hours as needed for mild pain and tramadol 50mg  two times daily as needed for moderate and severe pain. Continue to monitor  4. Hypothyroidism, unspecified hypothyroidism type Continue levothyroxine daily and monitor  5. Hyponatremia Continue Nacl 1g three times daily with meals. Recheck bmp in 1 week.   6. Dyslipidemia Continue lipitor 10mg  daily and monitor.   7. Dysuria Will order UA with C&S. Encourage hydration. Continue to monitor   8. Muscle weakness (generalized) Continue to work with PT/OT for gait/strength/balance training to restore and/or maximize functional capacity. Fall risk precautions. Continue to monitor her status  9. Diabetic peripheral neuropathy associated with type 2 diabetes mellitus Last A1C 6.9. Not on meds for diabetes. Continue gabapentin 300mg  three times daily and monitor  10. Depression Continue zoloft 100mg  daily  and monitor for change in mood.   11. Constipation Continue senna s twice daily with miralax daily as needed.   Time spent: 50 minutes on care coordination  Labs: BMP in 1 week  Family/Staff Communication Plan of care discussed with patient and nursing staff. Patient and nursing staff verbalized understanding and agree with plan of care. No additional questions or concerns reported.    Loura Back, MSN, AGNP-C [facility] [address] [place], [place] [address] [phone] [8am-5pm] After hours: [phone]

## 2014-04-02 ENCOUNTER — Non-Acute Institutional Stay (SKILLED_NURSING_FACILITY): Payer: Medicare Other | Admitting: Internal Medicine

## 2014-04-02 DIAGNOSIS — K311 Adult hypertrophic pyloric stenosis: Secondary | ICD-10-CM

## 2014-04-02 DIAGNOSIS — F329 Major depressive disorder, single episode, unspecified: Secondary | ICD-10-CM

## 2014-04-02 DIAGNOSIS — K2100 Gastro-esophageal reflux disease with esophagitis, without bleeding: Secondary | ICD-10-CM

## 2014-04-02 DIAGNOSIS — R001 Bradycardia, unspecified: Secondary | ICD-10-CM

## 2014-04-02 DIAGNOSIS — F32A Depression, unspecified: Secondary | ICD-10-CM

## 2014-04-02 DIAGNOSIS — I10 Essential (primary) hypertension: Secondary | ICD-10-CM

## 2014-04-02 DIAGNOSIS — E1143 Type 2 diabetes mellitus with diabetic autonomic (poly)neuropathy: Secondary | ICD-10-CM

## 2014-04-02 DIAGNOSIS — E785 Hyperlipidemia, unspecified: Secondary | ICD-10-CM | POA: Diagnosis not present

## 2014-04-02 DIAGNOSIS — E222 Syndrome of inappropriate secretion of antidiuretic hormone: Secondary | ICD-10-CM

## 2014-04-02 DIAGNOSIS — R531 Weakness: Secondary | ICD-10-CM | POA: Diagnosis not present

## 2014-04-02 DIAGNOSIS — E039 Hypothyroidism, unspecified: Secondary | ICD-10-CM

## 2014-04-02 NOTE — Progress Notes (Signed)
Patient ID: Shannon [name], female   DOB: 1923/11/16, 80 y.o.   MRN: [id number]     Facility: [facility]    PCP: Oneal Grout, MD  Code Status: DNR  No Known Allergies  Chief Complaint  Patient presents with  . New Admit To SNF     HPI:  80 y.o. female patient is here for STR post hospital admission from 03/27/14-03/30/14 with chest pain and hyponatremia. Cardiac cause were ruled out and pain was thought to be GI in origin. She was placed on salt tablets. Prior to this she was in the hospital from 03/12/14-03/26/14 with gastric outlet obstruction and hypertensive urgency. She is seen in her room today. She is in no distress and denies any concerns. Her bp has been elevated in the facility on review. Her heart rate has been low as well. She mentions feeling tired. She is working with therapy team. No other concerns She has PMH of hypothyroidism, seizure, CVA, HLD, HTN, DM2.  Review of Systems:  Constitutional: Negative for fever, chills,diaphoresis.  HENT: Negative for congestion  Eyes: Negative for eye pain, blurred vision, double vision and discharge.  Respiratory: Negative for cough, shortness of breath and wheezing.   Cardiovascular: Negative for chest pain, palpitations, leg swelling.  Gastrointestinal: Negative for heartburn, nausea, vomiting, abdominal pain. Genitourinary: Negative for dysuria, flank pain.  Musculoskeletal: Negative for back pain, falls Skin: Negative for itching, rash.  Neurological: Negative for dizziness, tingling, focal weakness and headaches.  Psychiatric/Behavioral: Negative for depression  Past Medical History  Diagnosis Date  . Pneumonia   . Thyroid disease   . Seizures   . Stroke   . Hyperlipidemia   . Hypertension   . Diabetes mellitus without complication   . Small bowel obstruction   . Ventral hernia with bowel obstruction    Past Surgical History  Procedure Laterality Date  . Exploratory laparotomy   09-07-2013  . Cholecystectomy    . Tubal ligation    . Colectomy    . Hernia repair  2015  . Esophagogastroduodenoscopy N/A 01/19/2014    Procedure: ESOPHAGOGASTRODUODENOSCOPY (EGD);  Surgeon: Hilarie Fredrickson, MD;  Location: [facility];  Service: Endoscopy;  Laterality: N/A;  . Esophagogastroduodenoscopy N/A 01/20/2014    Procedure: ESOPHAGOGASTRODUODENOSCOPY (EGD);  Surgeon: Hilarie Fredrickson, MD;  Location: [facility];  Service: Endoscopy;  Laterality: N/A;  . Esophagogastroduodenoscopy N/A 03/19/2014    Procedure: ESOPHAGOGASTRODUODENOSCOPY (EGD);  Surgeon: Rachael Fee, MD;  Location: [facility];  Service: Endoscopy;  Laterality: N/A;   Social History:   reports that she has never smoked. She does not have any smokeless tobacco history on file. She reports that she does not drink alcohol or use illicit drugs.  Family History  Problem Relation Age of Onset  . Stroke Mother   . Heart attack Neg Hx   . Hypertension Mother   . Diabetes Brother     Medications: Patient's Medications  New Prescriptions   No medications on file  Previous Medications   ACETAMINOPHEN (TYLENOL) 160 MG/5ML SOLUTION    Take 20.3 mLs (650 mg total) by mouth every 6 (six) hours as needed for mild pain or fever (>=101).   AMLODIPINE (NORVASC) 1 MG/ML SUSP ORAL SUSPENSION    Take 10 mLs (10 mg total) by mouth daily.   ANTISEPTIC ORAL RINSE (CPC / CETYLPYRIDINIUM CHLORIDE 0.05%) 0.05 % LIQD SOLUTION    7 mLs by Mouth Rinse route 2 times daily at 12 noon and 4 pm.  ASPIRIN EC 81 MG TABLET    Take 81 mg by mouth every morning.   ATORVASTATIN (LIPITOR) 10 MG TABLET    Take 10 mg by mouth daily at 6 PM.   B COMPLEX-C (B-COMPLEX WITH VITAMIN C) TABLET    Take 1 tablet by mouth 2 (two) times daily.   CALCIUM-VITAMIN D (OSCAL WITH D) 500-200 MG-UNIT PER TABLET    Take 1 tablet by mouth 2 (two) times daily.   CHLORHEXIDINE (PERIDEX) 0.12 % SOLUTION    15 mLs by Mouth Rinse route 2 (two) times daily.   CLONIDINE  (CATAPRES) 0.3 MG TABLET    Take 0.3 mg by mouth 3 (three) times daily.   CYCLOSPORINE (RESTASIS) 0.05 % OPHTHALMIC EMULSION    Place 1 drop into both eyes 2 (two) times daily.   FEEDING SUPPLEMENT, RESOURCE BREEZE, (RESOURCE BREEZE) LIQD    Take 1 Container by mouth 3 (three) times daily between meals.   FLUTICASONE (FLONASE) 50 MCG/ACT NASAL SPRAY    Place 2 sprays into both nostrils daily.   GABAPENTIN (NEURONTIN) 300 MG CAPSULE    Take 300 mg by mouth 3 (three) times daily.   HYDRALAZINE (APRESOLINE) 100 MG TABLET    Take 100 mg by mouth 3 (three) times daily.   ISOSORBIDE MONONITRATE (IMDUR) 60 MG 24 HR TABLET    Take 60 mg by mouth every morning.   L-METHYLFOLATE-B6-B12 (METANX) 3-35-2 MG TABS    Take 1 tablet by mouth every morning.   LACTOSE FREE NUTRITION (BOOST PLUS) LIQD    Take 237 mLs by mouth 2 (two) times daily between meals.   LATANOPROST (XALATAN) 0.005 % OPHTHALMIC SOLUTION    Place 1 drop into the right eye at bedtime.   LEVOTHYROXINE (SYNTHROID) 200 MCG TABLET    Take 1 tablet (200 mcg total) by mouth daily before breakfast.   METOPROLOL SUCCINATE (TOPROL-XL) 50 MG 24 HR TABLET    Take 50 mg by mouth daily. Take with or immediately following a meal.   MULTIPLE VITAMINS-MINERALS (MULTIVITAMIN WITH MINERALS) TABLET    Take 1 tablet by mouth every morning.    NITROGLYCERIN (NITRODUR - DOSED IN MG/24 HR) 0.2 MG/HR PATCH    Place 1 patch (0.2 mg total) onto the skin daily.   ONDANSETRON (ZOFRAN) 4 MG TABLET    Take 1 tablet (4 mg total) by mouth every 6 (six) hours as needed for nausea.   PANTOPRAZOLE (PROTONIX) 40 MG TABLET    Take 1 tablet (40 mg total) by mouth 2 (two) times daily.   POLYETHYLENE GLYCOL (MIRALAX / GLYCOLAX) PACKET    Take 17 g by mouth daily as needed for mild constipation.   POLYVINYL ALCOHOL (LIQUIFILM TEARS) 1.4 % OPHTHALMIC SOLUTION    Place 1 drop into both eyes as needed for dry eyes.   SENNOSIDES-DOCUSATE SODIUM (SENOKOT-S) 8.6-50 MG TABLET    Take 1 tablet  by mouth 2 (two) times daily.    SERTRALINE (ZOLOFT) 100 MG TABLET    Take 100 mg by mouth every morning.    SODIUM CHLORIDE 1 G TABLET    Take 1 tablet (1 g total) by mouth 3 (three) times daily with meals.   SUCRALFATE (CARAFATE) 1 G TABLET    Take 1 g by mouth 3 (three) times daily with meals.  Modified Medications   No medications on file  Discontinued Medications   No medications on file     Physical Exam: Filed Vitals:   04/02/14 1246  BP: 159/63  Pulse: 48  Temp: 97.2 F (36.2 C)  Resp: 18  SpO2: 95%    General- elderly female in no acute distress Head- atraumatic, normocephalic Eyes- PERRLA, EOMI, no pallor, no icterus, no discharge Neck- no cervical lymphadenopathy Throat- moist mucus membrane Cardiovascular- normal s1,s2, no murmurs, palpable dorsalis pedis Respiratory- bilateral clear to auscultation, no wheeze, no rhonchi, no crackles, no use of accessory muscles Abdomen- bowel sounds present, soft, non tender Musculoskeletal- able to move all 4 extremities, no leg edema, generalized weakness Neurological- no focal deficit Skin- warm and dry Psychiatry- alert and oriented, normal mood and affect   Labs reviewed: Basic Metabolic Panel:  Recent Labs  16/10/96  03/18/14 0615  03/29/14 1941 03/30/14 0300 03/30/14 1146 03/31/14  NA 127*  < > 126*  < > 123* 124* 126* 127*  K 4.3  < > 4.3  < > 4.1 3.8 3.7 4.4  CL  --   < > 93*  < > 91* 91* 93*  --   CO2  --   < > 20  < > 26 24 25   --   GLUCOSE  --   < > 116*  < > 142* 187* 177*  --   BUN 19  < > 7  < > 16 15 14 14   CREATININE 1.0  < > 0.96  < > 0.97 0.97 0.93 0.9  CALCIUM  --   < > 9.4  < > 9.4 9.2 9.4  --   MG 1.5*  --  1.5  --   --   --   --   --   < > = values in this interval not displayed. Liver Function Tests:  Recent Labs  03/12/14 1808 03/13/14 0555 03/16/14 0432  AST 26 21 19   ALT 16 13 12   ALKPHOS 77 60 74  BILITOT 0.7 0.6 0.6  PROT 8.3 6.7 7.6  ALBUMIN 4.4 3.4* 3.6    Recent Labs   01/17/14 2120 03/07/14 1138 03/12/14 1808  LIPASE 91* 41 49   No results for input(s): AMMONIA in the last 8760 hours. CBC:  Recent Labs  03/07/14 1138 03/12/14 1808 03/13/14 0555  03/26/14 0409 03/27/14 1445 03/27/14 1831 03/31/14  WBC 5.0 5.5 6.2  < > 4.7 4.1 4.5 5.5  NEUTROABS 3.3 3.7 4.1  --   --   --   --   --   HGB 10.3* 12.0 10.1*  < > 10.3* 10.4* 10.2* 10.2*  HCT 30.3* 34.7* 28.8*  < > 29.4* 29.4* 28.4* 29*  MCV 88.9 89.0 87.5  < > 86.7 83.8 84.0  --   PLT 276 289 240  < > 317 316 308 289  < > = values in this interval not displayed. Cardiac Enzymes:  Recent Labs  03/27/14 1831 03/27/14 2007 03/27/14 2345  TROPONINI <0.03 <0.03 <0.03   BNP: Invalid input(s): POCBNP CBG:  Recent Labs  03/17/14 0743 03/17/14 1325 03/17/14 1612  GLUCAP 169* 121* 120*    Radiological Exams: 03/12/14: CT abd: thickened pylorus and pre-pyloric antrum likely causing partial outlet obstruction. Large filling defect w/in stomach consistent with recurrent bezoar 12.2x10x15.2cm. Colonic diverticulosis. Suspected hepatic cyst unchanged. Small incisional hernia containing portion of tranverse colon wall w/o obstruction. 13mm high attenuation nodule adjacent to thickened gastroesophageal junction, question contained perforation vs. Epiphrenic diverticulum containing contrast.   03/22/14: Head CT: No acute intracranial pathology seen on CT. Mild to moderate cortical volume loss and scattered small vessel ischemic microangiopathy. Chronic infarct at left cerebellar  hemisphere.    03/23/14: MRI head: No acute intracranial pathology.    Assessment/Plan  Generalized weakness From deconditioning. Will have patient work with PT/OT as tolerated to regain strength and restore function.  Fall precautions are in place.  Hypertension Elevated bp in facility, currently on amlodipine 10mg  daily, clonidine 0.3mg /24hr patch once a week, hydralazine 100mg  three times daily, toprol 50mg  daily,  imdur 60mg  daily, and nitro 0.2mg /hr patch daily. Was started on clonidine 0.3mg  three time daily yesterday. With her slow heart rate, decrease toprol to 25 mg daily. D/c po clonidine and start lisinopril 10 mg daily for now. Monitor bp reading and increase lisinopril dosing if needed  Bradycardia Likely iatrogenic, decrease toprol to 25 mg daily and reassess  Gastric outlet obstruction Currently asymptomatic and has regular bowel movement. Continue pureed diet. Monitor clinically. Continue current bowel regimen  SIADH On salt tablets 1g tid, monitor bmp  gerd Continue protonix 40mg  daily with carafate 1g three times daily with meals.   Hypothyroidism Continue levothyroxine daily and monitor  Dyslipidemia Continue lipitor 10mg  daily   Type 2 dm with neuropathy Last A1C 6.9. Off meds, monitor cbg, continue gabapentin for neuropathic pain  Depression Continue zoloft 100mg  daily    Family/ staff Communication: reviewed care plan with patient and nursing supervisor   Goals of care: short term rehabilitation   Labs/tests ordered: cbc, cmp    Oneal Grout, MD  [facility] [phone] (Monday-Friday 8 am - 5 pm) [phone] (afterhours)

## 2014-04-06 ENCOUNTER — Encounter: Payer: Self-pay | Admitting: Nurse Practitioner

## 2014-04-08 ENCOUNTER — Encounter: Payer: Self-pay | Admitting: Cardiology

## 2014-04-08 ENCOUNTER — Ambulatory Visit (INDEPENDENT_AMBULATORY_CARE_PROVIDER_SITE_OTHER): Payer: Medicare Other | Admitting: Cardiology

## 2014-04-08 VITALS — BP 114/54 | HR 53 | Ht 66.0 in | Wt 138.8 lb

## 2014-04-08 DIAGNOSIS — I248 Other forms of acute ischemic heart disease: Secondary | ICD-10-CM

## 2014-04-08 DIAGNOSIS — I482 Chronic atrial fibrillation, unspecified: Secondary | ICD-10-CM

## 2014-04-08 DIAGNOSIS — I10 Essential (primary) hypertension: Secondary | ICD-10-CM

## 2014-04-08 MED ORDER — ISOSORBIDE MONONITRATE ER 30 MG PO TB24: 30.0000 mg | ORAL_TABLET | Freq: Every day | ORAL | Status: DC

## 2014-04-08 MED ORDER — ALBUTEROL SULFATE HFA 108 (90 BASE) MCG/ACT IN AERS
2.0000 | INHALATION_SPRAY | Freq: Four times a day (QID) | RESPIRATORY_TRACT | Status: DC | PRN
Start: 2014-04-08 — End: 2015-06-04

## 2014-04-08 MED ORDER — FLUTICASONE-SALMETEROL 250-50 MCG/DOSE IN AEPB: 1.0000 | INHALATION_SPRAY | Freq: Two times a day (BID) | RESPIRATORY_TRACT | Status: DC

## 2014-04-08 NOTE — Patient Instructions (Signed)
Your physician has recommended you make the following change in your medication:    DECREASE YOUR IMDUR TO 30 MG ONCE DAILY  START USING ADVAIR 250/50 INHALER---1 PUFF 2 TIMES DAILY  DR NELSON HAS PRESCRIBED YOU ALBUTEROL AS NEEDED FOR SOB, WHEEZING, AND COUGHING      Your physician recommends that you schedule a follow-up appointment in: [place]

## 2014-04-08 NOTE — Progress Notes (Signed)
Patient ID: Shannon [name], female   DOB: 1923/09/06, 81 y.o.   MRN: [id number]    Patient Name: Shannon [name] Date of Encounter: 04/08/2014  Primary Care Provider:  Oneal Grout, MD Primary Cardiologist:  Lars Masson  Problem List   Past Medical History  Diagnosis Date  . Pneumonia   . Thyroid disease   . Seizures   . Stroke   . Hyperlipidemia   . Hypertension   . Diabetes mellitus without complication   . Small bowel obstruction   . Ventral hernia with bowel obstruction    Past Surgical History  Procedure Laterality Date  . Exploratory laparotomy  09-07-2013  . Cholecystectomy    . Tubal ligation    . Colectomy    . Hernia repair  2015  . Esophagogastroduodenoscopy N/A 01/19/2014    Procedure: ESOPHAGOGASTRODUODENOSCOPY (EGD);  Surgeon: Hilarie Fredrickson, MD;  Location: [facility];  Service: Endoscopy;  Laterality: N/A;  . Esophagogastroduodenoscopy N/A 01/20/2014    Procedure: ESOPHAGOGASTRODUODENOSCOPY (EGD);  Surgeon: Hilarie Fredrickson, MD;  Location: [facility];  Service: Endoscopy;  Laterality: N/A;  . Esophagogastroduodenoscopy N/A 03/19/2014    Procedure: ESOPHAGOGASTRODUODENOSCOPY (EGD);  Surgeon: Rachael Fee, MD;  Location: [facility];  Service: Endoscopy;  Laterality: N/A;   Allergies  No Known Allergies  HPI  81 year old female who is coming for post-hospitalization follow up, she has h/o diabetes, chronic kidney disease stage III with recent exploratory laparotomy with EGD and resection of gastro-jejunal anastomosis, with current difficult to control hypertension and chest pain. Troponin elevated in the hospital in the settings of BP in the 190-200 systolic range. She has been transferred to the ICU. Metoprolol 5 mg IV has been administered, clonidine 0.3 he has been administered. Hydralazine 10 mg IV has been administered. We were asked for suggestions on reducing blood pressure. Cardiology was consulted and it was believed that troponin elevation is sec to  demand ischemia in the settings of hypertensive emergency. She also has h/o pre-syncope, MRI/MRA head neck showed chronic ischemic changes, no acute stroke and ? Left ICA stenosis, follow up US showed onlt 1-39% stenosis B/L.  02/18/2014 - The patient is coming for a follow up, she complains of overall fatigue and DOE, no chest pain. BP at home in the 150-160 range.   04/08/2013 - the patient was hospitalized at the end on December for another episode of SBO and hyponatremia. She is now in rehab. She had a negative stress test at the end of November with normal heart function and no prior scar or ischemia. She feels ok, complains of cough when she lays down at night. No SOB or chest pain.   Home Medications  Prior to Admission medications   Medication Sig Start Date End Date Taking? Authorizing Provider  acetaminophen (TYLENOL EX ST ARTHRITIS PAIN) 500 MG tablet Take 500 mg by mouth every 6 (six) hours as needed (for arthritis pain).   Yes Historical Provider, MD  antiseptic oral rinse (CPC / CETYLPYRIDINIUM CHLORIDE 0.05%) 0.05 % LIQD solution 7 mLs by Mouth Rinse route 2 times daily at 12 noon and 4 pm. 01/22/14  Yes Alison Murray, MD  aspirin EC 81 MG tablet Take 1 tablet (81 mg total) by mouth daily. 12/26/13  Yes Rodolph Bong, MD  atorvastatin (LIPITOR) 10 MG tablet Take 10 mg by mouth daily at 6 PM.   Yes Historical Provider, MD  Calcium Carbonate (CALTRATE 600 PO) Take 2 capsules by mouth 2 (two) times daily.  Yes Historical Provider, MD  chlorhexidine (PERIDEX) 0.12 % solution 15 mLs by Mouth Rinse route 2 (two) times daily. 01/22/14  Yes Alison Murray, MD  cycloSPORINE (RESTASIS) 0.05 % ophthalmic emulsion Place 1 drop into both eyes 2 (two) times daily.   Yes Historical Provider, MD  Dietary Management Product (VASCULERA) TABS Take 1 tablet by mouth daily.   Yes Historical Provider, MD  fluticasone (FLONASE) 50 MCG/ACT nasal spray Place 2 sprays into both nostrils daily.   Yes  Historical Provider, MD  gabapentin (NEURONTIN) 300 MG capsule Take 300 mg by mouth 3 (three) times daily.   Yes Historical Provider, MD  hydrALAZINE (APRESOLINE) 50 MG tablet Take one and half tablet by mouth three times daily to control blood pressure 02/06/14  Yes Tiffany L Reed, DO  isosorbide mononitrate (IMDUR) 30 MG 24 hr tablet Take 30 mg by mouth daily.   Yes Historical Provider, MD  l-methylfolate-B6-B12 Latrelle Dodrill) 3-35-2 MG TABS Take one tablet by mouth once daily 01/09/14  Yes Mahima Pandey, MD  levothyroxine (SYNTHROID, LEVOTHROID) 175 MCG tablet Take 175 mcg by mouth daily before breakfast.   Yes Historical Provider, MD  losartan (COZAAR) 100 MG tablet Take 100 mg by mouth daily.   Yes Historical Provider, MD  magnesium oxide (MAG-OX) 400 (241.3 MG) MG tablet TAKE ONE TABLET BY MOUTH TWICE DAILY 02/10/14  Yes Mahima Pandey, MD  metoprolol succinate (TOPROL-XL) 50 MG 24 hr tablet Take 50 mg by mouth daily. Take with or immediately following a meal.   Yes Historical Provider, MD  Multiple Vitamins-Minerals (MULTIVITAMIN WITH MINERALS) tablet Take 1 tablet by mouth daily.   Yes Historical Provider, MD  pantoprazole (PROTONIX) 40 MG tablet Take 40 mg by mouth 2 (two) times daily before a meal. 02/03/14  Yes Historical Provider, MD  polyethylene glycol powder (GLYCOLAX/MIRALAX) powder Take 17 g by mouth 2 (two) times daily as needed. 02/03/14  Yes Mahima Glade Lloyd, MD  sennosides-docusate sodium (SENOKOT-S) 8.6-50 MG tablet Take 1 tablet by mouth 2 (two) times daily.    Yes Historical Provider, MD  sertraline (ZOLOFT) 100 MG tablet Take 100 mg by mouth daily.   Yes Historical Provider, MD  sodium chloride 1 G tablet Take 1 g by mouth 2 (two) times daily with a meal.  02/05/14  Yes Historical Provider, MD  sucralfate (CARAFATE) 1 G tablet Take 1 g by mouth 3 (three) times daily with meals.   Yes Historical Provider, MD  traMADol (ULTRAM) 50 MG tablet Take 1 tablet (50 mg total) by mouth every 12  (twelve) hours as needed for severe pain (pain). 01/22/14  Yes Alison Murray, MD   Family History  Family History  Problem Relation Age of Onset  . Stroke Mother   . Heart attack Neg Hx   . Hypertension Mother   . Diabetes Brother     Social History  History   Social History  . Marital Status: Married    Spouse Name: N/A    Number of Children: N/A  . Years of Education: N/A   Occupational History  . Not on file.   Social History Main Topics  . Smoking status: Never Smoker   . Smokeless tobacco: Not on file  . Alcohol Use: No  . Drug Use: No  . Sexual Activity: Not on file   Other Topics Concern  . Not on file   Social History Narrative   Married in [place], lives in one story house with 2 people and no pets  Occupation: ?   Has a living Will, [place], and doesn't have DNR   Was living with Husband (in frail health) in [place] until 09/2013.  After her [facility] admission they both came to live with daughter in [place]     Review of Systems, as per HPI, otherwise negative General:  No chills, fever, night sweats or weight changes.  Cardiovascular:  No chest pain, dyspnea on exertion, edema, orthopnea, palpitations, paroxysmal nocturnal dyspnea. Dermatological: No rash, lesions/masses Respiratory: No cough, dyspnea Urologic: No hematuria, dysuria Abdominal:   No nausea, vomiting, diarrhea, bright red blood per rectum, melena, or hematemesis Neurologic:  No visual changes, wkns, changes in mental status. All other systems reviewed and are otherwise negative except as noted above.  Physical Exam  There were no vitals taken for this visit.  General: Pleasant, NAD Psych: Normal affect. Neuro: Alert and oriented X 3. Moves all extremities spontaneously. HEENT: Normal  Neck: Supple without bruits or JVD. Lungs:  Resp regular and unlabored, CTA. Heart: RRR no s3, s4, or murmurs. Abdomen: Soft, non-tender, non-distended, BS + x 4.  Extremities: No clubbing, cyanosis,  mild B/L LE edema. DP/PT/Radials 2+ and equal bilaterally.  Labs:  No results for input(s): CKTOTAL, CKMB, TROPONINI in the last 72 hours. Lab Results  Component Value Date   WBC 5.5 03/31/2014   HGB 10.2* 03/31/2014   HCT 29* 03/31/2014   MCV 84.0 03/27/2014   PLT 289 03/31/2014    No results found for: DDIMER Invalid input(s): POCBNP    Component Value Date/Time   NA 127* 03/31/2014   NA 126* 03/30/2014 1146   K 4.4 03/31/2014   CL 93* 03/30/2014 1146   CO2 25 03/30/2014 1146   GLUCOSE 177* 03/30/2014 1146   GLUCOSE 103* 03/05/2014 1654   BUN 14 03/31/2014   BUN 14 03/30/2014 1146   CREATININE 0.9 03/31/2014   CREATININE 0.93 03/30/2014 1146   CALCIUM 9.4 03/30/2014 1146   PROT 7.6 03/16/2014 0432   PROT 6.6 02/03/2014 1113   ALBUMIN 3.6 03/16/2014 0432   AST 19 03/16/2014 0432   ALT 12 03/16/2014 0432   ALKPHOS 74 03/16/2014 0432   BILITOT 0.6 03/16/2014 0432   GFRNONAA 53* 03/30/2014 1146   GFRAA 62* 03/30/2014 1146   Lab Results  Component Value Date   CHOL 110 12/24/2013   HDL 58 12/24/2013   LDLCALC 26 12/24/2013   TRIG 130 12/24/2013    Accessory Clinical Findings  Echocardiogram -12/24/2013 Left ventricle: The cavity size was mildly dilated. Wall thickness was increased in a pattern of mild LVH. Systolic function was normal. The estimated ejection fraction was in the range of 55% to 60%. - Aortic valve: There was mild to moderate regurgitation. - Mitral valve: There was mild regurgitation. - Left atrium: The atrium was moderately dilated. - Right atrium: The atrium was mildly dilated. - Atrial septum: Mobile and redundant with no obvious PFO.  ECG - NSR, normal ECG  Sono carotids: 12/25/2013 Summary: Bilateral: mild mixed plaque CCA. Moderate calcific plaque origin and proximal ICA and ECA. 1-39% ICA stenosis. Vertebral artery flow is antegrade. ICA/CCA ratio: R-1.2 L-1.21.  Other specific details can be found in the table(s)  above. Prepared and Electronically Authenticated by  Leonides Sake, MD 2015-09-24T16:13:21       Assessment & Plan  82 year old with bezoar, post op hypertension, transient chest pain, elevated troponin/demand ischemia in the setting of hypertensive emergency.   Principal Problem:  Gastric outlet obstruction Active Problems:  Essential hypertension, benign  GERD (gastroesophageal reflux disease)  Dyslipidemia  DM (diabetes mellitus) type II uncontrolled, periph vascular disorder  Diabetic neuropathy  Hypothyroidism  Depression  Anemia of chronic disease  CKD (chronic kidney disease) stage 3, GFR 30-59 ml/min  Gastric bezoar  1) Demand ischemia - during her recent episode of hypertension, troponin isolated mildly elevated 0.34 with subsequent normal troponin. However  The patient reports fatigue and DOE. Lexiscan nuclear stress test to ruled out ischemia, prior MI, normal LVEF.   2) Hypertension - too low now, decrease Imdur 60--> 30 mg PO daily., I will increase Imdur from 30 to 60 mg po daily.   3) Abnormal MRI, the patient and her daughter have a lot of questions regarding discrepancy between MRI and carotid US that didn't show any significant stenosis. She wants to follow with a neurologist for chronic ischemic changes.   4. LE edema - mild, low BP and h/o recent hyponatremia, no diuretics.  Follow up in 3 month.    Lars Masson, MD, [facility] 04/08/2014, 9:42 AM

## 2014-04-10 ENCOUNTER — Encounter: Payer: Self-pay | Admitting: Neurology

## 2014-04-10 ENCOUNTER — Ambulatory Visit (INDEPENDENT_AMBULATORY_CARE_PROVIDER_SITE_OTHER): Payer: Medicare Other | Admitting: Neurology

## 2014-04-10 VITALS — BP 98/58 | HR 70 | Resp 16 | Ht 66.0 in | Wt 138.0 lb

## 2014-04-10 DIAGNOSIS — E1142 Type 2 diabetes mellitus with diabetic polyneuropathy: Secondary | ICD-10-CM

## 2014-04-10 DIAGNOSIS — I248 Other forms of acute ischemic heart disease: Secondary | ICD-10-CM

## 2014-04-10 DIAGNOSIS — I10 Essential (primary) hypertension: Secondary | ICD-10-CM | POA: Diagnosis not present

## 2014-04-10 DIAGNOSIS — I679 Cerebrovascular disease, unspecified: Secondary | ICD-10-CM

## 2014-04-10 DIAGNOSIS — Z87898 Personal history of other specified conditions: Secondary | ICD-10-CM

## 2014-04-10 DIAGNOSIS — Z8669 Personal history of other diseases of the nervous system and sense organs: Secondary | ICD-10-CM

## 2014-04-10 NOTE — Patient Instructions (Addendum)
1.  Continue aspirin 81mg  daily 2.  Continue gabapentin 300mg  three times daily 3.  Try capsaicin cream and apply to the feet to treat neuropathic pain.  You can pick it up over the counter at the pharmacy. 4.  Blood pressure control and diabetes control 5.  Do not use tramadol if possible (use with caution in elderly and with history of seizures) 6.  Follow up in 3 months.

## 2014-04-10 NOTE — Progress Notes (Signed)
NEUROLOGY CONSULTATION NOTE  Shannon [name] MRN: [id number] DOB: [birth year]  Referring provider: Dr. Delton See Primary care provider: Dr. Glade Lloyd  Reason for consult:  History of stroke, seizures and diabetic neuropathy  HISTORY OF PRESENT ILLNESS: Shannon [name] is an 81 year old right-handed woman with hypertension, thyroid disease, hyperlipidemia, type II diabetes mellitus, chronic kidney disease stage III, atrial fibrillation and history of stroke, seizure, and resection of gastro-jejunal anastomosis who presents for history of stroke, seizures and diabetic neuropathy.  Records, labs and MRI reviewed.  She is accompanied by her daughter who provides most of the history.  She was admitted to [facility] in September with episode of slurred speech, generalized weakness, diaphoresis and lightheadedness.  Specific etiology was unclear.  MRI of the brain showed chronic small vessel ischemic changes with remote lacunar infarcts in the cerebellar hemispheres, both thalami and white matter, but no acute infarct seen.  MRA of the head showed no significant intracranial stenosis.  MRA of the neck showed possible significant stenosis at the origin of the right ICA.  However, carotid doppler revealed moderate calcific plaque at the origin and proximal ICA with 1-39% stenosis bilaterally.  2D echo showed LVEF 55-60%.  LDL was 26.  Hgb A1c was 6.9.  Following an EGD with resection of gastro-jejunal anastomosis, she developed hypertensive emergency.  She has had hypertension which has been difficult to control.  She was admitted again in December for encephalopathy.  Systolic blood pressure was in the 200s.  Na was 119.  She was found to have another SBO.  MRI of brain performed on 03/23/14 was unchanged compared to prior scan in September.  She was treated accordingly.    She has prior history of stroke many years ago, with subsequent seizures.  She was initially on Depakote and was later changed to another agent that  her daughter does not remember.  Since she had been seizure-free for many years, it was discontinued some time last year and has been doing well.  She has painful diabetic neuropathy.  She takes gabapentin 300mg  three times daily, which helps but still with discomfort.    PAST MEDICAL HISTORY: Past Medical History  Diagnosis Date  . Pneumonia   . Thyroid disease   . Seizures   . Stroke   . Hyperlipidemia   . Hypertension   . Diabetes mellitus without complication   . Small bowel obstruction   . Ventral hernia with bowel obstruction     PAST SURGICAL HISTORY: Past Surgical History  Procedure Laterality Date  . Exploratory laparotomy  09-07-2013  . Cholecystectomy    . Tubal ligation    . Colectomy    . Hernia repair  2015  . Esophagogastroduodenoscopy N/A 01/19/2014    Procedure: ESOPHAGOGASTRODUODENOSCOPY (EGD);  Surgeon: Hilarie Fredrickson, MD;  Location: [facility];  Service: Endoscopy;  Laterality: N/A;  . Esophagogastroduodenoscopy N/A 01/20/2014    Procedure: ESOPHAGOGASTRODUODENOSCOPY (EGD);  Surgeon: Hilarie Fredrickson, MD;  Location: [facility];  Service: Endoscopy;  Laterality: N/A;  . Esophagogastroduodenoscopy N/A 03/19/2014    Procedure: ESOPHAGOGASTRODUODENOSCOPY (EGD);  Surgeon: Rachael Fee, MD;  Location: [facility];  Service: Endoscopy;  Laterality: N/A;    MEDICATIONS: Current Outpatient Prescriptions on File Prior to Visit  Medication Sig Dispense Refill  . acetaminophen (TYLENOL) 160 MG/5ML solution Take 20.3 mLs (650 mg total) by mouth every 6 (six) hours as needed for mild pain or fever (>=101). 120 mL 0  . albuterol (PROVENTIL HFA;VENTOLIN HFA) 108 (90 BASE) MCG/ACT  inhaler Inhale 2 puffs into the lungs every 6 (six) hours as needed for wheezing or shortness of breath. 1 Inhaler 2  . amLODipine (NORVASC) 1 mg/mL SUSP oral suspension Take 10 mLs (10 mg total) by mouth daily. 100 mL 0  . antiseptic oral rinse (CPC / CETYLPYRIDINIUM CHLORIDE 0.05%) 0.05 % LIQD  solution 7 mLs by Mouth Rinse route 2 times daily at 12 noon and 4 pm. 44 mL 0  . aspirin EC 81 MG tablet Take 81 mg by mouth every morning.    [name] atorvastatin (LIPITOR) 10 MG tablet Take 10 mg by mouth daily at 6 PM.    . calcium-vitamin D (OSCAL WITH D) 500-200 MG-UNIT per tablet Take 1 tablet by mouth.    . chlorhexidine (PERIDEX) 0.12 % solution 15 mLs by Mouth Rinse route 2 (two) times daily. 120 mL 0  . ciprofloxacin (CIPRO) 500 MG tablet Take 500 mg by mouth 2 (two) times daily.    . cycloSPORINE (RESTASIS) 0.05 % ophthalmic emulsion Place 1 drop into both eyes 2 (two) times daily.    . fluticasone (FLONASE) 50 MCG/ACT nasal spray Place 2 sprays into both nostrils daily.    [name] gabapentin (NEURONTIN) 300 MG capsule Take 300 mg by mouth 3 (three) times daily.    . hydrALAZINE (APRESOLINE) 100 MG tablet Take 100 mg by mouth 3 (three) times daily.    . isosorbide mononitrate (IMDUR) 30 MG 24 hr tablet Take 1 tablet (30 mg total) by mouth daily. 90 tablet 3  . l-methylfolate-B6-B12 (METANX) 3-35-2 MG TABS Take 1 tablet by mouth every morning.    . latanoprost (XALATAN) 0.005 % ophthalmic solution Place 1 drop into the right eye at bedtime. 2.5 mL 12  . levothyroxine (SYNTHROID) 200 MCG tablet Take 1 tablet (200 mcg total) by mouth daily before breakfast. 30 tablet 0  . lisinopril (PRINIVIL,ZESTRIL) 10 MG tablet Take 10 mg by mouth daily.    . Multiple Vitamins-Minerals (MULTIVITAMIN WITH MINERALS) tablet Take 1 tablet by mouth every morning.     . nitroGLYCERIN (NITRODUR - DOSED IN MG/24 HR) 0.2 mg/hr patch Place 1 patch (0.2 mg total) onto the skin daily. 30 patch 12  . OVER THE COUNTER MEDICATION Clearlax    . pantoprazole (PROTONIX) 40 MG tablet Take 1 tablet (40 mg total) by mouth 2 (two) times daily. 60 tablet 0  . polyvinyl alcohol (LIQUIFILM TEARS) 1.4 % ophthalmic solution Place 1 drop into both eyes as needed for dry eyes. 15 mL 0  . promethazine (PHENERGAN) 12.5 MG tablet Take 12.5 mg  by mouth every 6 (six) hours as needed for nausea or vomiting.    . sennosides-docusate sodium (SENOKOT-S) 8.6-50 MG tablet Take 1 tablet by mouth 2 (two) times daily.     . sertraline (ZOLOFT) 100 MG tablet Take 100 mg by mouth every morning.     . sodium chloride 1 G tablet Take 1 tablet (1 g total) by mouth 3 (three) times daily with meals.    . sucralfate (CARAFATE) 1 G tablet Take 1 g by mouth 3 (three) times daily with meals.    . traMADol (ULTRAM) 50 MG tablet Take by mouth 2 (two) times daily as needed for moderate pain.     No current facility-administered medications on file prior to visit.    ALLERGIES: No Known Allergies  FAMILY HISTORY: Family History  Problem Relation Age of Onset  . Stroke Mother   . Heart attack Neg Hx   .  Hypertension Mother   . Diabetes Brother     SOCIAL HISTORY: History   Social History  . Marital Status: Married    Spouse Name: N/A    Number of Children: N/A  . Years of Education: N/A   Occupational History  . Not on file.   Social History Main Topics  . Smoking status: Never Smoker   . Smokeless tobacco: Not on file  . Alcohol Use: No  . Drug Use: No  . Sexual Activity: Not on file   Other Topics Concern  . Not on file   Social History Narrative   Married in [place], lives in one story house with 2 people and no pets   Occupation: ?   Has a living Will, [place], and doesn't have DNR   Was living with Husband (in frail health) in [place] until 09/2013.  After her [facility] admission they both came to live with daughter in [place]    REVIEW OF SYSTEMS: Constitutional: No fevers, chills, or sweats, no generalized fatigue, change in appetite Eyes: No visual changes, double vision, eye pain Ear, nose and throat: No hearing loss, ear pain, nasal congestion, sore throat Cardiovascular: No chest pain, palpitations Respiratory:  No shortness of breath at rest or with exertion, wheezes GastrointestinaI: No nausea, vomiting, diarrhea,  abdominal pain, fecal incontinence Genitourinary:  No dysuria, urinary retention or frequency Musculoskeletal:  No neck pain, back pain Integumentary: No rash, pruritus, skin lesions Neurological: as above Psychiatric: No depression, insomnia, anxiety Endocrine: No palpitations, fatigue, diaphoresis, mood swings, change in appetite, change in weight, increased thirst Hematologic/Lymphatic:  No anemia, purpura, petechiae. Allergic/Immunologic: no itchy/runny eyes, nasal congestion, recent allergic reactions, rashes  PHYSICAL EXAM: Filed Vitals:   04/10/14 0925  BP: 98/58  Pulse: 70  Resp: 16   General: No acute distress Head:  Normocephalic/atraumatic Eyes:  fundi not visualized on inspection Neck: supple, no paraspinal tenderness, full range of motion Back: No paraspinal tenderness Heart: regular rate and rhythm Lungs: Clear to auscultation bilaterally. Vascular: No carotid bruits. Neurological Exam: Mental status: alert and oriented to person, place, and time, recent and remote memory intact, fund of knowledge intact, attention and concentration intact, speech fluent and not dysarthric, language intact. Cranial nerves: CN I: not tested CN II: pupils equal, round and reactive to light, visual fields intact, fundi not visualized CN III, IV, VI:  full range of motion, no nystagmus, no ptosis CN V: facial sensation intact CN VII: upper and lower face symmetric CN VIII: hearing intact CN IX, X: gag intact, uvula midline CN XI: sternocleidomastoid and trapezius muscles intact CN XII: tongue midline Bulk & Tone: normal, no fasciculations. Motor:  5/5 throughout Sensation:  Reduced pinprick sensation and vibration sensation up to knees. Deep Tendon Reflexes:  1+ throughout except absent in ankles.  Toes downgoing. Finger to nose testing:  No dysmetria Gait:  In wheelchair.  Cannot ambulate without walker which is currently not available.  IMPRESSION: Cerebrovascular  disease Painful diabetic polyneuropathy History of post-stroke seizures, stable  PLAN: Continue ASA 81mg   Continue statin (LDL at goal of less than 70) Blood pressure and diabetes control Continue gabapentin 300mg  three times daily. Recommended trying capsaicin cream on feet for nerve pain Follow up in 3 months.  Thank you for allowing me to take part in the care of this patient.  Shon Millet, DO  CC:  Tobias Alexander, MD  Oneal Grout, MD

## 2014-04-13 LAB — BASIC METABOLIC PANEL
BUN: 25 mg/dL — AB (ref 4–21)
Creatinine: 1.2 mg/dL — AB (ref 0.5–1.1)
Glucose: 125 mg/dL
Potassium: 4.7 mmol/L (ref 3.4–5.3)
Sodium: 123 mmol/L — AB (ref 137–147)

## 2014-04-15 ENCOUNTER — Encounter: Payer: Medicare Other | Admitting: Internal Medicine

## 2014-04-16 ENCOUNTER — Encounter: Payer: Self-pay | Admitting: Registered Nurse

## 2014-04-16 ENCOUNTER — Non-Acute Institutional Stay (SKILLED_NURSING_FACILITY): Payer: Medicare Other | Admitting: Registered Nurse

## 2014-04-16 DIAGNOSIS — E785 Hyperlipidemia, unspecified: Secondary | ICD-10-CM

## 2014-04-16 DIAGNOSIS — E039 Hypothyroidism, unspecified: Secondary | ICD-10-CM

## 2014-04-16 DIAGNOSIS — K2100 Gastro-esophageal reflux disease with esophagitis, without bleeding: Secondary | ICD-10-CM

## 2014-04-16 DIAGNOSIS — R531 Weakness: Secondary | ICD-10-CM | POA: Diagnosis not present

## 2014-04-16 DIAGNOSIS — E1142 Type 2 diabetes mellitus with diabetic polyneuropathy: Secondary | ICD-10-CM | POA: Diagnosis not present

## 2014-04-16 DIAGNOSIS — F32A Depression, unspecified: Secondary | ICD-10-CM

## 2014-04-16 DIAGNOSIS — G629 Polyneuropathy, unspecified: Secondary | ICD-10-CM

## 2014-04-16 DIAGNOSIS — I10 Essential (primary) hypertension: Secondary | ICD-10-CM | POA: Diagnosis not present

## 2014-04-16 DIAGNOSIS — E222 Syndrome of inappropriate secretion of antidiuretic hormone: Secondary | ICD-10-CM

## 2014-04-16 DIAGNOSIS — K5901 Slow transit constipation: Secondary | ICD-10-CM | POA: Diagnosis not present

## 2014-04-16 DIAGNOSIS — F329 Major depressive disorder, single episode, unspecified: Secondary | ICD-10-CM

## 2014-04-16 NOTE — Progress Notes (Signed)
Patient ID: Shannon [name], female   DOB: 07/31/1923, 81 y.o.   MRN: [id number]    Place of Service: [facility]  No Known Allergies  Code Status: DNR  Goals of Care: Comfort and Quality of Life/STR  Chief Complaint  Patient presents with  . Discharge Note    HPI 81 y.o. female with PMH of hypothyroidism, seizure, CVA, HLD, HTN, DM2 among others is being seen for a discharge visit. She was here for short-term rehabilitation post hospital admission from 03/27/14 to 03/30/14 for chest pain thought to be GI in origin as troponins were neagative. Prior to this, patient was here for STR post hospital admission from 03/12/14 to 03/26/14 for gastric outlet obstruction, accelerated HTN, and SIADH. She has worked well with therapy team is ready to be discharged hoe with HH PT/OT/RN and DME (3-1). Seen in rom today. No complaints verbalized by patient.   Review of Systems Constitutional: Negative for fever and chills. HENT: Negative for ear pain, congestion, and sore throat Eyes: Negative for eye pain, eye discharge, and visual disturbance  Cardiovascular: Negative for chest pain, palpitations, and leg swelling Respiratory: Negative cough, shortness of breath, and wheezing.  Gastrointestinal: Negative for nausea and vomiting. Negative for abdominal pain, diarrhea and constipation.  Genitourinary: Negative for dysuria hematuria Endocrine: Negative for polydipsia, polyphagia, and polyuria Musculoskeletal: Negative for back pain, joint pain, and joint swelling  Neurological: Negative for dizziness, headache, and tremors. Positive for neuropathy Skin: Negative for rash and wound.   Psychiatric: Negative for depression  Past Medical History  Diagnosis Date  . Pneumonia   . Thyroid disease   . Seizures   . Stroke   . Hyperlipidemia   . Hypertension   . Diabetes mellitus without complication   . Small bowel obstruction   . Ventral hernia with bowel obstruction     Past Surgical  History  Procedure Laterality Date  . Exploratory laparotomy  09-07-2013  . Cholecystectomy    . Tubal ligation    . Colectomy    . Hernia repair  2015  . Esophagogastroduodenoscopy N/A 01/19/2014    Procedure: ESOPHAGOGASTRODUODENOSCOPY (EGD);  Surgeon: Hilarie Fredrickson, MD;  Location: [facility];  Service: Endoscopy;  Laterality: N/A;  . Esophagogastroduodenoscopy N/A 01/20/2014    Procedure: ESOPHAGOGASTRODUODENOSCOPY (EGD);  Surgeon: Hilarie Fredrickson, MD;  Location: [facility];  Service: Endoscopy;  Laterality: N/A;  . Esophagogastroduodenoscopy N/A 03/19/2014    Procedure: ESOPHAGOGASTRODUODENOSCOPY (EGD);  Surgeon: Rachael Fee, MD;  Location: [facility];  Service: Endoscopy;  Laterality: N/A;    History  Substance Use Topics  . Smoking status: Never Smoker   . Smokeless tobacco: Not on file  . Alcohol Use: No    Family History  Problem Relation Age of Onset  . Stroke Mother   . Heart attack Neg Hx   . Hypertension Mother   . Diabetes Brother       Medication List       This list is accurate as of: 04/16/14  8:35 PM.  Always use your most recent med list.               acetaminophen 325 MG tablet  Commonly known as:  TYLENOL  Take 650 mg by mouth every 6 (six) hours as needed for mild pain or fever.     albuterol 108 (90 BASE) MCG/ACT inhaler  Commonly known as:  PROVENTIL HFA;VENTOLIN HFA  Inhale 2 puffs into the lungs every 6 (six) hours as needed for  wheezing or shortness of breath.     amLODipine 10 MG tablet  Commonly known as:  NORVASC  Take 10 mg by mouth daily.     aspirin EC 81 MG tablet  Take 81 mg by mouth every morning.     atorvastatin 10 MG tablet  Commonly known as:  LIPITOR  Take 10 mg by mouth daily at 6 PM.     budesonide-formoterol 160-4.5 MCG/ACT inhaler  Commonly known as:  SYMBICORT  Inhale 2 puffs into the lungs 2 (two) times daily.     calcium-vitamin D 500-200 MG-UNIT per tablet  Commonly known as:  OSCAL WITH D  Take 1  tablet by mouth.     chlorhexidine 0.12 % solution  Commonly known as:  PERIDEX  15 mLs by Mouth Rinse route 2 (two) times daily.     cloNIDine 0.3 mg/24hr patch  Commonly known as:  CATAPRES - Dosed in mg/24 hr  Place 0.3 mg onto the skin once a week.     cycloSPORINE 0.05 % ophthalmic emulsion  Commonly known as:  RESTASIS  Place 1 drop into both eyes 2 (two) times daily.     fluticasone 50 MCG/ACT nasal spray  Commonly known as:  FLONASE  Place 2 sprays into both nostrils daily.     gabapentin 300 MG capsule  Commonly known as:  NEURONTIN  Take 300 mg by mouth 3 (three) times daily.     hydrALAZINE 100 MG tablet  Commonly known as:  APRESOLINE  Take 100 mg by mouth 3 (three) times daily.     isosorbide mononitrate 30 MG 24 hr tablet  Commonly known as:  IMDUR  Take 1 tablet (30 mg total) by mouth daily.     l-methylfolate-B6-B12 3-35-2 MG Tabs  Commonly known as:  METANX  Take 1 tablet by mouth every morning.     latanoprost 0.005 % ophthalmic solution  Commonly known as:  XALATAN  Place 1 drop into the right eye at bedtime.     levothyroxine 200 MCG tablet  Commonly known as:  SYNTHROID  Take 1 tablet (200 mcg total) by mouth daily before breakfast.     lisinopril 10 MG tablet  Commonly known as:  PRINIVIL,ZESTRIL  Take 10 mg by mouth daily.     metoprolol succinate 25 MG 24 hr tablet  Commonly known as:  TOPROL-XL  Take 25 mg by mouth daily.     multivitamin with minerals tablet  Take 1 tablet by mouth every morning.     nitroGLYCERIN 0.2 mg/hr patch  Commonly known as:  NITRODUR - Dosed in mg/24 hr  Place 1 patch (0.2 mg total) onto the skin daily.     OVER THE COUNTER MEDICATION  Clearlax     pantoprazole 40 MG tablet  Commonly known as:  PROTONIX  Take 1 tablet (40 mg total) by mouth 2 (two) times daily.     polyvinyl alcohol 1.4 % ophthalmic solution  Commonly known as:  LIQUIFILM TEARS  Place 1 drop into both eyes as needed for dry eyes.       sennosides-docusate sodium 8.6-50 MG tablet  Commonly known as:  SENOKOT-S  Take 1 tablet by mouth 2 (two) times daily.     sertraline 100 MG tablet  Commonly known as:  ZOLOFT  Take 100 mg by mouth every morning.     sodium chloride 1 G tablet  Take 1 tablet (1 g total) by mouth 3 (three) times daily with meals.  sucralfate 1 G tablet  Commonly known as:  CARAFATE  Take 1 g by mouth 3 (three) times daily with meals.     traMADol 50 MG tablet  Commonly known as:  ULTRAM  Take by mouth 2 (two) times daily as needed for moderate pain.        Physical Exam  BP 132/70 mmHg  Pulse 69  Temp(Src) 97.3 F (36.3 C)  Resp 18  Ht 5\' 6"  (1.676 m)  Wt 138 lb 6.4 oz (62.778 kg)  BMI 22.35 kg/m2  Constitutional: thin elderly female in no acute distress. Conversant and pleasant HEENT: Normocephalic and atraumatic. PERRL. EOM intact. No icterus. Eyeglasses in place. No nasal discharge or sinus tenderness. Oral mucosa moist. Posterior pharynx clear of any exudate or lesions. Poor dentition Neck: Supple and nontender. No lymphadenopathy, masses, or thyromegaly. No JVD or carotid bruits. Cardiac: Normal S1, S2. RRR without appreciable murmurs, rubs, or gallops. Distal pulses intact. Trace pitting edema of BLE Lungs: No respiratory distress. Breath sounds clear bilaterally without rales, rhonchi, or wheezes. Abdomen: Audible bowel sounds in all quadrants. Soft, nontender, nondistended. No palpable mass.  Musculoskeletal: able to move all extremities with generalized muscle weakness present. No joint erythema or tenderness. Skin: Warm and dry. No rash noted. No erythema.  Neurological: Alert and oriented to person, place, and time. No focal deficits.  Psychiatric:  Appropriate mood and affect.   Labs Reviewed  CBC Latest Ref Rng 03/31/2014 03/27/2014 03/27/2014  WBC - 5.5 4.5 4.1  Hemoglobin 12.0 - 16.0 g/dL 10.2(A) 10.2(L) 10.4(L)  Hematocrit 36 - 46 % 29(A) 28.4(L) 29.4(L)   Platelets 150 - 399 K/L 289 308 316    CMP Latest Ref Rng 04/13/2014 03/31/2014 03/30/2014  Glucose 70 - 99 mg/dL - - 308(M)  BUN 4 - 21 mg/dL 57(Q) 14 14  Creatinine 0.5 - 1.1 mg/dL 1.2(A) 0.9 0.93  Sodium 137 - 147 mmol/L 123(A) 127(A) 126(L)  Potassium 3.4 - 5.3 mmol/L 4.7 4.4 3.7  Chloride 96 - 112 mEq/L - - 93(L)  CO2 19 - 32 mmol/L - - 25  Calcium 8.4 - 10.5 mg/dL - - 9.4  Total Protein 6.0 - 8.3 g/dL - - -  Albumin 3.5 - 4.7 g/dL - - -  Total Bilirubin 0.3 - 1.2 mg/dL - - -  Alkaline Phos 39 - 117 U/L - - -  AST 0 - 37 U/L - - -  ALT 0 - 35 U/L - - -    Lab Results  Component Value Date   HGBA1C 6.9* 12/24/2013    Lab Results  Component Value Date   TSH 27.660* 03/22/2014    Lipid Panel     Component Value Date/Time   CHOL 110 12/24/2013 0456   TRIG 130 12/24/2013 0456   HDL 58 12/24/2013 0456   CHOLHDL 1.9 12/24/2013 0456   VLDL 26 12/24/2013 0456   LDLCALC 26 12/24/2013 0456   Lab Results  Component Value Date   TROPONINI <0.03 03/27/2014    Diagnostic Studies Reviewed 03/12/14: CT abd: thickened pylorus and pre-pyloric antrum likely causing partial outlet obstruction. Large filling defect w/in stomach consistent with recurrent bezoar 12.2x10x15.2cm. Colonic diverticulosis. Suspected hepatic cyst unchanged. Small incisional hernia containing portion of tranverse colon wall w/o obstruction. 13mm high attenuation nodule adjacent to thickened gastroesophageal junction, question contained perforation vs. Epiphrenic diverticulum containing contrast.   03/22/14: Head CT: No acute intracranial pathology seen on CT. Mild to moderate cortical volume loss and scattered small vessel ischemic  microangiopathy. Chronic infarct at left cerebellar hemisphere.   03/23/14: MRI head: No acute intracranial pathology.   Assessment & Plan 1. Hypertension, uncontrolled  Improving. BP range from 120/60 to 183/74 with average readings in 150s/70s. Continue amlodipine 10mg   daily, clonidine 0.3mg /24hr patch once a week, hydralazine 100mg  three times daily, toprol 25mg  daily (hold if HR <60), imdur 30mg  daily, lisinopril 10mg  daily, and nitro 0.2mg /hr patch daily. F/u with PCP  2. Gastroesophageal reflux disease with esophagitis Stable. Continue protonix 40mg  two times daily and carafate 1g three times daily with meals.   3. Hypothyroidism, unspecified hypothyroidism type Continue levothyroxine daily and monitor. PCP to recheck TSH and adjust dosage as indicated  4. SIADH Recent Na level 123 on 04/13/14. Continue Sodium chloride 1g three times daily with meals. F/u with PCP   5. Dyslipidemia Last LDL 26. LDL goal <70. Continue lipitor 10mg  daily.   6. Muscle weakness (generalized) Improving. Continue to work with [facility] PT/OT for gait/strength/balance training to restore and/or maximize functional capacity. Fall risk precautions.   7. Diabetic peripheral neuropathy associated with type 2 diabetes mellitus Last A1C 6.9. Not on meds for diabetes. Continue gabapentin 300mg  three times daily.   8. Depression Mood stable. Continue zoloft 100mg  daily  9. Constipation Continue senna s twice daily with miralax daily as needed.   Home health services:PT/OT/RN DME required: 3-1 PCP follow-up: Dr. Kirt Boys on 04/29/14 at 10:30am 30-day supply of prescription medications provided. (#30 tramadol 50mg )  Time spent: 45 minutes on care coordination   Family/Staff Communication Plan of care discussed with patient and nursing staff. Patient and nursing staff verbalized understanding and agree with plan of care. No additional questions or concerns reported.    Loura Back, MSN, AGNP-C [facility] [address] [place], [place] [address] [phone] [8am-5pm] After hours: [phone]

## 2014-04-17 DIAGNOSIS — Z961 Presence of intraocular lens: Secondary | ICD-10-CM | POA: Diagnosis not present

## 2014-04-17 DIAGNOSIS — H4011X0 Primary open-angle glaucoma, stage unspecified: Secondary | ICD-10-CM | POA: Diagnosis not present

## 2014-04-17 DIAGNOSIS — H04123 Dry eye syndrome of bilateral lacrimal glands: Secondary | ICD-10-CM | POA: Diagnosis not present

## 2014-04-17 DIAGNOSIS — I10 Essential (primary) hypertension: Secondary | ICD-10-CM | POA: Diagnosis not present

## 2014-04-19 ENCOUNTER — Other Ambulatory Visit: Payer: Self-pay | Admitting: Internal Medicine

## 2014-04-19 DIAGNOSIS — R278 Other lack of coordination: Secondary | ICD-10-CM | POA: Diagnosis not present

## 2014-04-19 DIAGNOSIS — E1143 Type 2 diabetes mellitus with diabetic autonomic (poly)neuropathy: Secondary | ICD-10-CM

## 2014-04-19 DIAGNOSIS — K311 Adult hypertrophic pyloric stenosis: Secondary | ICD-10-CM | POA: Diagnosis not present

## 2014-04-19 DIAGNOSIS — I10 Essential (primary) hypertension: Secondary | ICD-10-CM

## 2014-04-19 DIAGNOSIS — F339 Major depressive disorder, recurrent, unspecified: Secondary | ICD-10-CM | POA: Diagnosis not present

## 2014-04-19 DIAGNOSIS — E222 Syndrome of inappropriate secretion of antidiuretic hormone: Secondary | ICD-10-CM | POA: Diagnosis not present

## 2014-04-19 DIAGNOSIS — Z8673 Personal history of transient ischemic attack (TIA), and cerebral infarction without residual deficits: Secondary | ICD-10-CM | POA: Diagnosis not present

## 2014-04-19 DIAGNOSIS — E785 Hyperlipidemia, unspecified: Secondary | ICD-10-CM | POA: Diagnosis not present

## 2014-04-19 DIAGNOSIS — R2681 Unsteadiness on feet: Secondary | ICD-10-CM | POA: Diagnosis not present

## 2014-04-21 DIAGNOSIS — F339 Major depressive disorder, recurrent, unspecified: Secondary | ICD-10-CM | POA: Diagnosis not present

## 2014-04-21 DIAGNOSIS — I10 Essential (primary) hypertension: Secondary | ICD-10-CM | POA: Diagnosis not present

## 2014-04-21 DIAGNOSIS — E1143 Type 2 diabetes mellitus with diabetic autonomic (poly)neuropathy: Secondary | ICD-10-CM | POA: Diagnosis not present

## 2014-04-21 DIAGNOSIS — E785 Hyperlipidemia, unspecified: Secondary | ICD-10-CM | POA: Diagnosis not present

## 2014-04-21 DIAGNOSIS — K311 Adult hypertrophic pyloric stenosis: Secondary | ICD-10-CM | POA: Diagnosis not present

## 2014-04-21 DIAGNOSIS — E222 Syndrome of inappropriate secretion of antidiuretic hormone: Secondary | ICD-10-CM | POA: Diagnosis not present

## 2014-04-22 DIAGNOSIS — K311 Adult hypertrophic pyloric stenosis: Secondary | ICD-10-CM | POA: Diagnosis not present

## 2014-04-22 DIAGNOSIS — E222 Syndrome of inappropriate secretion of antidiuretic hormone: Secondary | ICD-10-CM | POA: Diagnosis not present

## 2014-04-22 DIAGNOSIS — E1143 Type 2 diabetes mellitus with diabetic autonomic (poly)neuropathy: Secondary | ICD-10-CM | POA: Diagnosis not present

## 2014-04-22 DIAGNOSIS — F339 Major depressive disorder, recurrent, unspecified: Secondary | ICD-10-CM | POA: Diagnosis not present

## 2014-04-22 DIAGNOSIS — I10 Essential (primary) hypertension: Secondary | ICD-10-CM | POA: Diagnosis not present

## 2014-04-22 DIAGNOSIS — E785 Hyperlipidemia, unspecified: Secondary | ICD-10-CM | POA: Diagnosis not present

## 2014-04-27 DIAGNOSIS — I10 Essential (primary) hypertension: Secondary | ICD-10-CM | POA: Diagnosis not present

## 2014-04-27 DIAGNOSIS — E1143 Type 2 diabetes mellitus with diabetic autonomic (poly)neuropathy: Secondary | ICD-10-CM | POA: Diagnosis not present

## 2014-04-27 DIAGNOSIS — E222 Syndrome of inappropriate secretion of antidiuretic hormone: Secondary | ICD-10-CM | POA: Diagnosis not present

## 2014-04-27 DIAGNOSIS — K311 Adult hypertrophic pyloric stenosis: Secondary | ICD-10-CM | POA: Diagnosis not present

## 2014-04-27 DIAGNOSIS — F339 Major depressive disorder, recurrent, unspecified: Secondary | ICD-10-CM | POA: Diagnosis not present

## 2014-04-27 DIAGNOSIS — E785 Hyperlipidemia, unspecified: Secondary | ICD-10-CM | POA: Diagnosis not present

## 2014-04-28 DIAGNOSIS — K311 Adult hypertrophic pyloric stenosis: Secondary | ICD-10-CM | POA: Diagnosis not present

## 2014-04-28 DIAGNOSIS — E1143 Type 2 diabetes mellitus with diabetic autonomic (poly)neuropathy: Secondary | ICD-10-CM | POA: Diagnosis not present

## 2014-04-28 DIAGNOSIS — E222 Syndrome of inappropriate secretion of antidiuretic hormone: Secondary | ICD-10-CM | POA: Diagnosis not present

## 2014-04-28 DIAGNOSIS — I10 Essential (primary) hypertension: Secondary | ICD-10-CM | POA: Diagnosis not present

## 2014-04-28 DIAGNOSIS — F339 Major depressive disorder, recurrent, unspecified: Secondary | ICD-10-CM | POA: Diagnosis not present

## 2014-04-28 DIAGNOSIS — E785 Hyperlipidemia, unspecified: Secondary | ICD-10-CM | POA: Diagnosis not present

## 2014-04-29 ENCOUNTER — Ambulatory Visit (INDEPENDENT_AMBULATORY_CARE_PROVIDER_SITE_OTHER): Payer: Medicare Other | Admitting: Internal Medicine

## 2014-04-29 ENCOUNTER — Encounter: Payer: Self-pay | Admitting: Internal Medicine

## 2014-04-29 VITALS — BP 122/82 | HR 59 | Temp 97.6°F | Resp 18 | Ht 66.0 in | Wt 142.0 lb

## 2014-04-29 DIAGNOSIS — E222 Syndrome of inappropriate secretion of antidiuretic hormone: Secondary | ICD-10-CM | POA: Diagnosis not present

## 2014-04-29 DIAGNOSIS — K311 Adult hypertrophic pyloric stenosis: Secondary | ICD-10-CM

## 2014-04-29 DIAGNOSIS — I10 Essential (primary) hypertension: Secondary | ICD-10-CM

## 2014-04-29 DIAGNOSIS — I248 Other forms of acute ischemic heart disease: Secondary | ICD-10-CM | POA: Diagnosis not present

## 2014-04-29 DIAGNOSIS — R531 Weakness: Secondary | ICD-10-CM | POA: Diagnosis not present

## 2014-04-29 DIAGNOSIS — E871 Hypo-osmolality and hyponatremia: Secondary | ICD-10-CM | POA: Diagnosis not present

## 2014-04-29 DIAGNOSIS — R2681 Unsteadiness on feet: Secondary | ICD-10-CM | POA: Diagnosis not present

## 2014-04-29 NOTE — Progress Notes (Signed)
Patient ID: Shannon [name], female   DOB: 01-11-1924, 81 y.o.   MRN: [id number]    Facility  [facility]     Place of Service:   OFFICE   No Known Allergies  Chief Complaint  Patient presents with  . Hospitalization Follow-up    recently d/c'd from SNF    HPI:  80 yo female seen today for rehab f/u. She has been d/c'd from hospital x 2 in last few mos. She spent several weeks in rehab. She is now on a pureed diet due to inability tochew. She needs several teeth pulled but does not want to go to the dentist until March due to the cold weather. She needs dentures also. She requests DME equipment to improve her QOL. [facility] is providing HH. Needs rolling walker with seat since her balance is improving. PT is following her at home. She also needs a shower bench. [facility] Nurse also comes out. She is taking all meds as Rx including Na tabs TID and decreased water intake. She uses dry mouth lozenges. Her Na level was 123 about 2 weeks ago.   Medications: Patient's Medications  New Prescriptions   No medications on file  Previous Medications   ACETAMINOPHEN (TYLENOL) 325 MG TABLET    Take 650 mg by mouth every 6 (six) hours as needed for mild pain or fever.   ALBUTEROL (PROVENTIL HFA;VENTOLIN HFA) 108 (90 BASE) MCG/ACT INHALER    Inhale 2 puffs into the lungs every 6 (six) hours as needed for wheezing or shortness of breath.   AMLODIPINE (NORVASC) 10 MG TABLET    Take 10 mg by mouth daily. For HTN   ASPIRIN EC 81 MG TABLET    Take 81 mg by mouth every morning.   ATORVASTATIN (LIPITOR) 10 MG TABLET    Take 10 mg by mouth daily at 6 PM. For hyperliperdemia   BUDESONIDE-FORMOTEROL (SYMBICORT) 160-4.5 MCG/ACT INHALER    Inhale 2 puffs into the lungs 2 (two) times daily.   CALCIUM-VITAMIN D (OSCAL WITH D) 500-200 MG-UNIT PER TABLET    Take 1 tablet by mouth.   CHLORHEXIDINE (PERIDEX) 0.12 % SOLUTION    15 mLs by Mouth Rinse route 2 (two) times daily.   CLONIDINE (CATAPRES - DOSED IN MG/24 HR) 0.3  MG/24HR PATCH    Place 0.3 mg onto the skin once a week.   CYCLOSPORINE (RESTASIS) 0.05 % OPHTHALMIC EMULSION    Place 1 drop into both eyes 2 (two) times daily.   FLUTICASONE (FLONASE) 50 MCG/ACT NASAL SPRAY    Place 2 sprays into both nostrils daily.   GABAPENTIN (NEURONTIN) 300 MG CAPSULE    Take 300 mg by mouth 3 (three) times daily.   HYDRALAZINE (APRESOLINE) 100 MG TABLET    Take 100 mg by mouth 3 (three) times daily. For HTN   ISOSORBIDE MONONITRATE (IMDUR) 30 MG 24 HR TABLET    Take 1 tablet (30 mg total) by mouth daily.   L-METHYLFOLATE-B6-B12 (METANX) 3-35-2 MG TABS    Take 1 tablet by mouth every morning.   LATANOPROST (XALATAN) 0.005 % OPHTHALMIC SOLUTION    Place 1 drop into the right eye at bedtime.   LEVOTHYROXINE (SYNTHROID) 200 MCG TABLET    Take 1 tablet (200 mcg total) by mouth daily before breakfast.   LISINOPRIL (PRINIVIL,ZESTRIL) 10 MG TABLET    Take 10 mg by mouth daily.   METOPROLOL SUCCINATE (TOPROL-XL) 25 MG 24 HR TABLET    Take 25 mg by mouth daily.  MULTIPLE VITAMINS-MINERALS (MULTIVITAMIN WITH MINERALS) TABLET    Take 1 tablet by mouth every morning.    NITROGLYCERIN (NITRODUR - DOSED IN MG/24 HR) 0.2 MG/HR PATCH    Place 1 patch (0.2 mg total) onto the skin daily.   OVER THE COUNTER MEDICATION    Clearlax   PANTOPRAZOLE (PROTONIX) 40 MG TABLET    Take 1 tablet (40 mg total) by mouth 2 (two) times daily.   POLYVINYL ALCOHOL (LIQUIFILM TEARS) 1.4 % OPHTHALMIC SOLUTION    Place 1 drop into both eyes as needed for dry eyes.   SENNOSIDES-DOCUSATE SODIUM (SENOKOT-S) 8.6-50 MG TABLET    Take 1 tablet by mouth 2 (two) times daily.    SERTRALINE (ZOLOFT) 100 MG TABLET    Take 100 mg by mouth every morning. For depression   SODIUM CHLORIDE 1 G TABLET    Take 1 tablet (1 g total) by mouth 3 (three) times daily with meals.   SUCRALFATE (CARAFATE) 1 G TABLET    Take 1 g by mouth 3 (three) times daily with meals. For GERD   TRAMADOL (ULTRAM) 50 MG TABLET    Take by mouth 2 (two)  times daily as needed for moderate pain.  Modified Medications   No medications on file  Discontinued Medications   SODIUM CHLORIDE 1 G TABLET    TAKE 1 TABLET (1 G TOTAL) BY MOUTH 2 (TWO) TIMES DAILY WITH A MEAL.     Review of Systems As above. All other systems reviewed are negative  Filed Vitals:   04/29/14 1051  BP: 122/82  Pulse: 59  Temp: 97.6 F (36.4 C)  TempSrc: Oral  Resp: 18  Height: 5\' 6"  (1.676 m)  Weight: 142 lb (64.411 kg)  SpO2: 97%   Body mass index is 22.93 kg/(m^2).  Physical Exam CONSTITUTIONAL: Looks frail in NAD. Awake, alert and oriented x 3. Daughter and great-grandson present HEENT: PERRLA. Oropharynx clear and without exudate. MMdry NECK: Supple. Nontender. No palpable cervical or supraclavicular lymph nodes. No carotid bruit b/l.  CVS: Regular rate without murmur, gallop or rub. LUNGS: CTA b/l no wheezing, rales or rhonchi. ABDOMEN: Bowel sounds present x 4. Soft, nontender, nondistended. No palpable mass or bruit EXTREMITIES: +1 pitting LE edema b/l. Distal pulses palpable. No calf tenderness PSYCH: Affect, behavior and mood normal MUSC: slow unstable gait using traditional walker.  Labs reviewed: Nursing Home on 04/16/2014  Component Date Value Ref Range Status  . Glucose 04/13/2014 125   Final  . BUN 04/13/2014 25* 4 - 21 mg/dL Final  . Creatinine 45/40/9811 1.2* 0.5 - 1.1 mg/dL Final  . Potassium 91/47/8295 4.7  3.4 - 5.3 mmol/L Final  . Sodium 04/13/2014 123* 137 - 147 mmol/L Final  Nursing Home on 03/31/2014  Component Date Value Ref Range Status  . Hemoglobin 03/31/2014 10.2* 12.0 - 16.0 g/dL Final  . HCT 62/13/0865 29* 36 - 46 % Final  . Platelets 03/31/2014 289  150 - 399 K/L Final  . WBC 03/31/2014 5.5   Final  . Glucose 03/31/2014 128   Final  . BUN 03/31/2014 14  4 - 21 mg/dL Final  . Creatinine 78/46/9629 0.9  0.5 - 1.1 mg/dL Final  . Potassium 52/84/1324 4.4  3.4 - 5.3 mmol/L Final  . Sodium 03/31/2014 127* 137 - 147  mmol/L Final  Admission on 03/27/2014, Discharged on 03/30/2014  Component Date Value Ref Range Status  . WBC 03/27/2014 4.1  4.0 - 10.5 K/uL Final  . RBC 03/27/2014 3.51*  3.87 - 5.11 MIL/uL Final  . Hemoglobin 03/27/2014 10.4* 12.0 - 15.0 g/dL Final  . HCT 69/62/9528 29.4* 36.0 - 46.0 % Final  . MCV 03/27/2014 83.8  78.0 - 100.0 fL Final  . MCH 03/27/2014 29.6  26.0 - 34.0 pg Final  . MCHC 03/27/2014 35.4  30.0 - 36.0 g/dL Final  . RDW 41/32/4401 14.8  11.5 - 15.5 % Final  . Platelets 03/27/2014 316  150 - 400 K/uL Final  . Sodium 03/27/2014 119* 135 - 145 mmol/L Final   Comment: Please note change in reference range. REPEATED TO VERIFY CRITICAL RESULT CALLED TO, READ BACK BY AND VERIFIED WITH: C..NIKOLICH,RN [id number] 03/27/14 M.CAMPBELL   . Potassium 03/27/2014 4.2  3.5 - 5.1 mmol/L Final   Please note change in reference range.  . Chloride 03/27/2014 87* 96 - 112 mEq/L Final  . CO2 03/27/2014 23  19 - 32 mmol/L Final  . Glucose, Bld 03/27/2014 147* 70 - 99 mg/dL Final  . BUN 02/72/5366 9  6 - 23 mg/dL Final  . Creatinine, Ser 03/27/2014 0.87  0.50 - 1.10 mg/dL Final  . Calcium 44/06/4740 9.3  8.4 - 10.5 mg/dL Final  . GFR calc non Af Amer 03/27/2014 58* >90 mL/min Final  . GFR calc Af Amer 03/27/2014 67* >90 mL/min Final   Comment: (NOTE) The eGFR has been calculated using the CKD EPI equation. This calculation has not been validated in all clinical situations. eGFR's persistently <90 mL/min signify possible Chronic Kidney Disease.   . Anion gap 03/27/2014 9  5 - 15 Final  . Troponin i, poc 03/27/2014 0.00  0.00 - 0.08 ng/mL Final  . Comment 3 03/27/2014          Final   Comment: Due to the release kinetics of cTnI, a negative result within the first hours of the onset of symptoms does not rule out myocardial infarction with certainty. If myocardial infarction is still suspected, repeat the test at appropriate intervals.   . Troponin I 03/27/2014 <0.03  <0.031 ng/mL Final     Comment:        NO INDICATION OF MYOCARDIAL INJURY. Please note change in reference range.   . Troponin I 03/27/2014 <0.03  <0.031 ng/mL Final   Comment:        NO INDICATION OF MYOCARDIAL INJURY. Please note change in reference range.   . Troponin I 03/27/2014 <0.03  <0.031 ng/mL Final   Comment:        NO INDICATION OF MYOCARDIAL INJURY. Please note change in reference range.   . WBC 03/27/2014 4.5  4.0 - 10.5 K/uL Final  . RBC 03/27/2014 3.38* 3.87 - 5.11 MIL/uL Final  . Hemoglobin 03/27/2014 10.2* 12.0 - 15.0 g/dL Final  . HCT 59/56/3875 28.4* 36.0 - 46.0 % Final  . MCV 03/27/2014 84.0  78.0 - 100.0 fL Final  . MCH 03/27/2014 30.2  26.0 - 34.0 pg Final  . MCHC 03/27/2014 35.9  30.0 - 36.0 g/dL Final  . RDW 64/33/2951 15.0  11.5 - 15.5 % Final  . Platelets 03/27/2014 308  150 - 400 K/uL Final  . Creatinine, Ser 03/27/2014 0.98  0.50 - 1.10 mg/dL Final  . GFR calc non Af Amer 03/27/2014 50* >90 mL/min Final  . GFR calc Af Amer 03/27/2014 58* >90 mL/min Final   Comment: (NOTE) The eGFR has been calculated using the CKD EPI equation. This calculation has not been validated in all clinical situations. eGFR's persistently <90 mL/min signify possible  Chronic Kidney Disease.   . Sodium 03/28/2014 121* 135 - 145 mmol/L Final   Please note change in reference range.  . Potassium 03/28/2014 4.2  3.5 - 5.1 mmol/L Final   Please note change in reference range.  . Chloride 03/28/2014 86* 96 - 112 mEq/L Final  . CO2 03/28/2014 24  19 - 32 mmol/L Final  . Glucose, Bld 03/28/2014 178* 70 - 99 mg/dL Final  . BUN 08/65/7846 12  6 - 23 mg/dL Final  . Creatinine, Ser 03/28/2014 1.32* 0.50 - 1.10 mg/dL Final  . Calcium 96/29/5284 9.4  8.4 - 10.5 mg/dL Final  . GFR calc non Af Amer 03/28/2014 35* >90 mL/min Final  . GFR calc Af Amer 03/28/2014 40* >90 mL/min Final   Comment: (NOTE) The eGFR has been calculated using the CKD EPI equation. This calculation has not been validated in all  clinical situations. eGFR's persistently <90 mL/min signify possible Chronic Kidney Disease.   . Anion gap 03/28/2014 11  5 - 15 Final  . Sodium 03/29/2014 120* 135 - 145 mmol/L Final   Please note change in reference range.  . Potassium 03/29/2014 4.0  3.5 - 5.1 mmol/L Final   Please note change in reference range.  . Chloride 03/29/2014 86* 96 - 112 mEq/L Final  . CO2 03/29/2014 27  19 - 32 mmol/L Final  . Glucose, Bld 03/29/2014 160* 70 - 99 mg/dL Final  . BUN 13/24/4010 14  6 - 23 mg/dL Final  . Creatinine, Ser 03/29/2014 1.11* 0.50 - 1.10 mg/dL Final  . Calcium 27/25/3664 9.5  8.4 - 10.5 mg/dL Final  . GFR calc non Af Amer 03/29/2014 43* >90 mL/min Final  . GFR calc Af Amer 03/29/2014 50* >90 mL/min Final   Comment: (NOTE) The eGFR has been calculated using the CKD EPI equation. This calculation has not been validated in all clinical situations. eGFR's persistently <90 mL/min signify possible Chronic Kidney Disease.   . Anion gap 03/29/2014 7  5 - 15 Final  . Sodium 03/29/2014 119* 135 - 145 mmol/L Final   Comment: Please note change in reference range. REPEATED TO VERIFY CRITICAL RESULT CALLED TO, READ BACK BY AND VERIFIED WITH: A.BRAKE,RN 1317 03/29/14 M.CAMPBELL   . Potassium 03/29/2014 4.0  3.5 - 5.1 mmol/L Final   Please note change in reference range.  . Chloride 03/29/2014 86* 96 - 112 mEq/L Final  . CO2 03/29/2014 25  19 - 32 mmol/L Final  . Glucose, Bld 03/29/2014 125* 70 - 99 mg/dL Final  . BUN 40/34/7425 16  6 - 23 mg/dL Final  . Creatinine, Ser 03/29/2014 1.06  0.50 - 1.10 mg/dL Final  . Calcium 95/63/8756 9.5  8.4 - 10.5 mg/dL Final  . GFR calc non Af Amer 03/29/2014 45* >90 mL/min Final  . GFR calc Af Amer 03/29/2014 53* >90 mL/min Final   Comment: (NOTE) The eGFR has been calculated using the CKD EPI equation. This calculation has not been validated in all clinical situations. eGFR's persistently <90 mL/min signify possible Chronic Kidney Disease.   .  Anion gap 03/29/2014 8  5 - 15 Final  . Sodium 03/29/2014 123* 135 - 145 mmol/L Final   Please note change in reference range.  . Potassium 03/29/2014 4.1  3.5 - 5.1 mmol/L Final   Please note change in reference range.  . Chloride 03/29/2014 91* 96 - 112 mEq/L Final  . CO2 03/29/2014 26  19 - 32 mmol/L Final  . Glucose, Bld 03/29/2014 142*  70 - 99 mg/dL Final  . BUN 72/53/6644 16  6 - 23 mg/dL Final  . Creatinine, Ser 03/29/2014 0.97  0.50 - 1.10 mg/dL Final  . Calcium 03/47/4259 9.4  8.4 - 10.5 mg/dL Final  . GFR calc non Af Amer 03/29/2014 51* >90 mL/min Final  . GFR calc Af Amer 03/29/2014 59* >90 mL/min Final   Comment: (NOTE) The eGFR has been calculated using the CKD EPI equation. This calculation has not been validated in all clinical situations. eGFR's persistently <90 mL/min signify possible Chronic Kidney Disease.   . Anion gap 03/29/2014 6  5 - 15 Final  . Sodium 03/30/2014 124* 135 - 145 mmol/L Final   Please note change in reference range.  . Potassium 03/30/2014 3.8  3.5 - 5.1 mmol/L Final   Please note change in reference range.  . Chloride 03/30/2014 91* 96 - 112 mEq/L Final  . CO2 03/30/2014 24  19 - 32 mmol/L Final  . Glucose, Bld 03/30/2014 187* 70 - 99 mg/dL Final  . BUN 56/38/7564 15  6 - 23 mg/dL Final  . Creatinine, Ser 03/30/2014 0.97  0.50 - 1.10 mg/dL Final  . Calcium 33/29/5188 9.2  8.4 - 10.5 mg/dL Final  . GFR calc non Af Amer 03/30/2014 51* >90 mL/min Final  . GFR calc Af Amer 03/30/2014 59* >90 mL/min Final   Comment: (NOTE) The eGFR has been calculated using the CKD EPI equation. This calculation has not been validated in all clinical situations. eGFR's persistently <90 mL/min signify possible Chronic Kidney Disease.   . Anion gap 03/30/2014 9  5 - 15 Final  . Sodium 03/30/2014 126* 135 - 145 mmol/L Final   Please note change in reference range.  . Potassium 03/30/2014 3.7  3.5 - 5.1 mmol/L Final   Please note change in reference range.  .  Chloride 03/30/2014 93* 96 - 112 mEq/L Final  . CO2 03/30/2014 25  19 - 32 mmol/L Final  . Glucose, Bld 03/30/2014 177* 70 - 99 mg/dL Final  . BUN 41/66/0630 14  6 - 23 mg/dL Final  . Creatinine, Ser 03/30/2014 0.93  0.50 - 1.10 mg/dL Final  . Calcium 16/04/930 9.4  8.4 - 10.5 mg/dL Final  . GFR calc non Af Amer 03/30/2014 53* >90 mL/min Final  . GFR calc Af Amer 03/30/2014 62* >90 mL/min Final   Comment: (NOTE) The eGFR has been calculated using the CKD EPI equation. This calculation has not been validated in all clinical situations. eGFR's persistently <90 mL/min signify possible Chronic Kidney Disease.   . Anion gap 03/30/2014 8  5 - 15 Final  Admission on 03/12/2014, Discharged on 03/26/2014  No results displayed because visit has over 200 results.    Admission on 03/07/2014, Discharged on 03/07/2014  Component Date Value Ref Range Status  . WBC 03/07/2014 5.0  4.0 - 10.5 K/uL Final  . RBC 03/07/2014 3.41* 3.87 - 5.11 MIL/uL Final  . Hemoglobin 03/07/2014 10.3* 12.0 - 15.0 g/dL Final  . HCT 35/57/3220 30.3* 36.0 - 46.0 % Final  . MCV 03/07/2014 88.9  78.0 - 100.0 fL Final  . MCH 03/07/2014 30.2  26.0 - 34.0 pg Final  . MCHC 03/07/2014 34.0  30.0 - 36.0 g/dL Final  . RDW 25/42/7062 15.8* 11.5 - 15.5 % Final  . Platelets 03/07/2014 276  150 - 400 K/uL Final  . Neutrophils Relative % 03/07/2014 66  43 - 77 % Final  . Neutro Abs 03/07/2014 3.3  1.7 -  7.7 K/uL Final  . Lymphocytes Relative 03/07/2014 16  12 - 46 % Final  . Lymphs Abs 03/07/2014 0.8  0.7 - 4.0 K/uL Final  . Monocytes Relative 03/07/2014 13* 3 - 12 % Final  . Monocytes Absolute 03/07/2014 0.7  0.1 - 1.0 K/uL Final  . Eosinophils Relative 03/07/2014 4  0 - 5 % Final  . Eosinophils Absolute 03/07/2014 0.2  0.0 - 0.7 K/uL Final  . Basophils Relative 03/07/2014 1  0 - 1 % Final  . Basophils Absolute 03/07/2014 0.0  0.0 - 0.1 K/uL Final  . Sodium 03/07/2014 129* 137 - 147 mEq/L Final  . Potassium 03/07/2014 4.6  3.7  - 5.3 mEq/L Final  . Chloride 03/07/2014 92* 96 - 112 mEq/L Final  . CO2 03/07/2014 22  19 - 32 mEq/L Final  . Glucose, Bld 03/07/2014 128* 70 - 99 mg/dL Final  . BUN 16/01/9603 22  6 - 23 mg/dL Final  . Creatinine, Ser 03/07/2014 1.12* 0.50 - 1.10 mg/dL Final  . Calcium 54/12/8117 10.5  8.4 - 10.5 mg/dL Final  . Total Protein 03/07/2014 7.8  6.0 - 8.3 g/dL Final  . Albumin 14/78/2956 4.0  3.5 - 5.2 g/dL Final  . AST 21/30/8657 28  0 - 37 U/L Final   Comment: SLIGHT HEMOLYSIS HEMOLYSIS AT THIS LEVEL MAY AFFECT RESULT   . ALT 03/07/2014 20  0 - 35 U/L Final  . Alkaline Phosphatase 03/07/2014 69  39 - 117 U/L Final  . Total Bilirubin 03/07/2014 0.6  0.3 - 1.2 mg/dL Final  . GFR calc non Af Amer 03/07/2014 43* >90 mL/min Final  . GFR calc Af Amer 03/07/2014 49* >90 mL/min Final   Comment: (NOTE) The eGFR has been calculated using the CKD EPI equation. This calculation has not been validated in all clinical situations. eGFR's persistently <90 mL/min signify possible Chronic Kidney Disease.   . Anion gap 03/07/2014 15  5 - 15 Final  . Lactic Acid, Venous 03/07/2014 0.92  0.5 - 2.2 mmol/L Final  . Lipase 03/07/2014 41  11 - 59 U/L Final  . Color, Urine 03/07/2014 YELLOW  YELLOW Final  . APPearance 03/07/2014 CLEAR  CLEAR Final  . Specific Gravity, Urine 03/07/2014 1.013  1.005 - 1.030 Final  . pH 03/07/2014 8.0  5.0 - 8.0 Final  . Glucose, UA 03/07/2014 NEGATIVE  NEGATIVE mg/dL Final  . Hgb urine dipstick 03/07/2014 TRACE* NEGATIVE Final  . Bilirubin Urine 03/07/2014 NEGATIVE  NEGATIVE Final  . Ketones, ur 03/07/2014 NEGATIVE  NEGATIVE mg/dL Final  . Protein, ur 84/69/6295 100* NEGATIVE mg/dL Final  . Urobilinogen, UA 03/07/2014 0.2  0.0 - 1.0 mg/dL Final  . Nitrite 28/41/3244 NEGATIVE  NEGATIVE Final  . Leukocytes, UA 03/07/2014 NEGATIVE  NEGATIVE Final  . Specimen Description 03/07/2014 URINE, CLEAN CATCH   Final  . Special Requests 03/07/2014 NONE   Final  . Culture  Setup Time  03/07/2014    Final                   Value:03/07/2014 20:02 Performed at [facility]   . Colony Count 03/07/2014    Final                   Value:75,000 COLONIES/ML Performed at [facility]   . Culture 03/07/2014    Final                   Value:Multiple bacterial morphotypes present, none predominant. Suggest  appropriate recollection if clinically indicated. Performed at [facility]   . Report Status 03/07/2014 03/08/2014 FINAL   Final  . Troponin I 03/07/2014 <0.30  <0.30 ng/mL Final   Comment:        Due to the release kinetics of cTnI, a negative result within the first hours of the onset of symptoms does not rule out myocardial infarction with certainty. If myocardial infarction is still suspected, repeat the test at appropriate intervals.   . Fecal Occult Bld 03/07/2014 NEGATIVE  NEGATIVE Final  . RBC / HPF 03/07/2014 0-2  <3 RBC/hpf Final  . Bacteria, UA 03/07/2014 FEW* RARE Final  Office Visit on 03/05/2014  Component Date Value Ref Range Status  . Glucose 03/05/2014 103* 65 - 99 mg/dL Final  . BUN 16/01/9603 22  8 - 27 mg/dL Final  . Creatinine, Ser 03/05/2014 1.13* 0.57 - 1.00 mg/dL Final  . GFR calc non Af Amer 03/05/2014 43* >59 mL/min/1.73 Final  . GFR calc Af Amer 03/05/2014 50* >59 mL/min/1.73 Final  . BUN/Creatinine Ratio 03/05/2014 19  11 - 26 Final  . Sodium 03/05/2014 132* 134 - 144 mmol/L Final  . Potassium 03/05/2014 4.2  3.5 - 5.2 mmol/L Final  . Chloride 03/05/2014 95* 97 - 108 mmol/L Final  . CO2 03/05/2014 24  18 - 29 mmol/L Final  . Calcium 03/05/2014 10.0  8.7 - 10.3 mg/dL Final  Appointment on 54/12/8117  Component Date Value Ref Range Status  . Glucose 02/17/2014 124* 65 - 99 mg/dL Final  . BUN 14/78/2956 27  8 - 27 mg/dL Final  . Creatinine, Ser 02/17/2014 1.29* 0.57 - 1.00 mg/dL Final  . GFR calc non Af Amer 02/17/2014 37* >59 mL/min/1.73 Final  . GFR calc Af Amer 02/17/2014 43* >59 mL/min/1.73 Final  .  BUN/Creatinine Ratio 02/17/2014 21  11 - 26 Final  . Sodium 02/17/2014 127* 134 - 144 mmol/L Final  . Potassium 02/17/2014 5.0  3.5 - 5.2 mmol/L Final  . Chloride 02/17/2014 89* 97 - 108 mmol/L Final  . CO2 02/17/2014 22  18 - 29 mmol/L Final  . Calcium 02/17/2014 9.8  8.7 - 10.3 mg/dL Final  Office Visit on 02/03/2014  Component Date Value Ref Range Status  . WBC 02/03/2014 3.5  3.4 - 10.8 x10E3/uL Final  . RBC 02/03/2014 3.24* 3.77 - 5.28 x10E6/uL Final  . Hemoglobin 02/03/2014 10.0* 11.1 - 15.9 g/dL Final  . HCT 21/30/8657 28.2* 34.0 - 46.6 % Final  . MCV 02/03/2014 87  79 - 97 fL Final  . MCH 02/03/2014 30.9  26.6 - 33.0 pg Final  . MCHC 02/03/2014 35.5  31.5 - 35.7 g/dL Final  . RDW 84/69/6295 16.0* 12.3 - 15.4 % Final  . Neutrophils Relative % 02/03/2014 52   Final  . Lymphs 02/03/2014 22   Final  . Monocytes 02/03/2014 13   Final  . Eos 02/03/2014 11   Final  . Basos 02/03/2014 2   Final  . Neutrophils Absolute 02/03/2014 1.8  1.4 - 7.0 x10E3/uL Final  . Lymphocytes Absolute 02/03/2014 0.8  0.7 - 3.1 x10E3/uL Final  . Monocytes Absolute 02/03/2014 0.4  0.1 - 0.9 x10E3/uL Final  . Eosinophils Absolute 02/03/2014 0.4  0.0 - 0.4 x10E3/uL Final  . Basophils Absolute 02/03/2014 0.1  0.0 - 0.2 x10E3/uL Final  . Immature Granulocytes 02/03/2014 0   Final  . Immature Grans (Abs) 02/03/2014 0.0  0.0 - 0.1 x10E3/uL Final  . Glucose 02/03/2014 99  65 -  99 mg/dL Final  . BUN 09/81/1914 19  8 - 27 mg/dL Final  . Creatinine, Ser 02/03/2014 1.18* 0.57 - 1.00 mg/dL Final  . GFR calc non Af Amer 02/03/2014 41* >59 mL/min/1.73 Final  . GFR calc Af Amer 02/03/2014 48* >59 mL/min/1.73 Final  . BUN/Creatinine Ratio 02/03/2014 16  11 - 26 Final  . Sodium 02/03/2014 120* 134 - 144 mmol/L Final  . Potassium 02/03/2014 4.7  3.5 - 5.2 mmol/L Final  . Chloride 02/03/2014 83* 97 - 108 mmol/L Final  . CO2 02/03/2014 25  18 - 29 mmol/L Final  . Calcium 02/03/2014 9.6  8.7 - 10.3 mg/dL Final  . Total  Protein 02/03/2014 6.6  6.0 - 8.5 g/dL Final  . Albumin 78/29/5621 4.1  3.5 - 4.7 g/dL Final  . Globulin, Total 02/03/2014 2.5  1.5 - 4.5 g/dL Final  . Albumin/Globulin Ratio 02/03/2014 1.6  1.1 - 2.5 Final  . Total Bilirubin 02/03/2014 0.4  0.0 - 1.2 mg/dL Final  . Alkaline Phosphatase 02/03/2014 64  39 - 117 IU/L Final  . AST 02/03/2014 28  0 - 40 IU/L Final  . ALT 02/03/2014 17  0 - 32 IU/L Final     Assessment/Plan    ICD-9-CM ICD-10-CM   1. Hyponatremia 276.1 E87.1 Basic Metabolic Panel  2. 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 Gastric outlet obstruction 537.0 K31.1   4. Essential hypertension 401.9 I10   5. Generalized weakness 780.79 R53.1   6. Unstable gait 781.2 R26.81    --may need HH to draw BMP in 2 weeks.  --continue water restriction. Drink no more than 3 8oz bottles per day including with medications  --continue HH nurse and PT  --Rx written for rolling walker with seat and shower bench for unstable gait, generalized weakness and CVA  --continue all other medications as rx  --f/u with Dr Glade Lloyd as scheduled  Bertis Ruddy. Ancil Linsey  [facility] [address] [place], [place] [address] [phone] Office (Wednesdays and Fridays 8 AM - 5 PM) [phone] Cell (Monday-Friday 8 AM - 5 PM)

## 2014-04-29 NOTE — Patient Instructions (Signed)
Water restriction of no more than 3 8 oz bottles of water daily  Continue sodium tabs  May use dry mouth lozenges as needed to keep mouth moist  F/u with specialists as scheduled  Rx for rolling walker with seat and shower bench written  Keep appt with Dr Bubba Camp as scheduled

## 2014-04-30 DIAGNOSIS — I10 Essential (primary) hypertension: Secondary | ICD-10-CM | POA: Diagnosis not present

## 2014-04-30 DIAGNOSIS — E222 Syndrome of inappropriate secretion of antidiuretic hormone: Secondary | ICD-10-CM | POA: Diagnosis not present

## 2014-04-30 DIAGNOSIS — E1143 Type 2 diabetes mellitus with diabetic autonomic (poly)neuropathy: Secondary | ICD-10-CM | POA: Diagnosis not present

## 2014-04-30 DIAGNOSIS — F339 Major depressive disorder, recurrent, unspecified: Secondary | ICD-10-CM | POA: Diagnosis not present

## 2014-04-30 DIAGNOSIS — E785 Hyperlipidemia, unspecified: Secondary | ICD-10-CM | POA: Diagnosis not present

## 2014-04-30 DIAGNOSIS — K311 Adult hypertrophic pyloric stenosis: Secondary | ICD-10-CM | POA: Diagnosis not present

## 2014-04-30 LAB — BASIC METABOLIC PANEL
BUN/Creatinine Ratio: 20 (ref 11–26)
BUN: 27 mg/dL (ref 8–27)
CO2: 22 mmol/L (ref 18–29)
Calcium: 10.3 mg/dL (ref 8.7–10.3)
Chloride: 98 mmol/L (ref 97–108)
Creatinine, Ser: 1.34 mg/dL — ABNORMAL HIGH (ref 0.57–1.00)
GFR calc Af Amer: 41 mL/min/{1.73_m2} — ABNORMAL LOW (ref >59)
GFR calc non Af Amer: 35 mL/min/{1.73_m2} — ABNORMAL LOW (ref >59)
Glucose: 146 mg/dL — ABNORMAL HIGH (ref 65–99)
Potassium: 4.8 mmol/L (ref 3.5–5.2)
Sodium: 135 mmol/L (ref 134–144)

## 2014-05-01 DIAGNOSIS — E222 Syndrome of inappropriate secretion of antidiuretic hormone: Secondary | ICD-10-CM | POA: Diagnosis not present

## 2014-05-01 DIAGNOSIS — F339 Major depressive disorder, recurrent, unspecified: Secondary | ICD-10-CM | POA: Diagnosis not present

## 2014-05-01 DIAGNOSIS — I10 Essential (primary) hypertension: Secondary | ICD-10-CM | POA: Diagnosis not present

## 2014-05-01 DIAGNOSIS — E1143 Type 2 diabetes mellitus with diabetic autonomic (poly)neuropathy: Secondary | ICD-10-CM | POA: Diagnosis not present

## 2014-05-01 DIAGNOSIS — E785 Hyperlipidemia, unspecified: Secondary | ICD-10-CM | POA: Diagnosis not present

## 2014-05-01 DIAGNOSIS — K311 Adult hypertrophic pyloric stenosis: Secondary | ICD-10-CM | POA: Diagnosis not present

## 2014-05-04 ENCOUNTER — Telehealth: Payer: Self-pay | Admitting: *Deleted

## 2014-05-04 DIAGNOSIS — E1143 Type 2 diabetes mellitus with diabetic autonomic (poly)neuropathy: Secondary | ICD-10-CM | POA: Diagnosis not present

## 2014-05-04 DIAGNOSIS — E785 Hyperlipidemia, unspecified: Secondary | ICD-10-CM | POA: Diagnosis not present

## 2014-05-04 DIAGNOSIS — E222 Syndrome of inappropriate secretion of antidiuretic hormone: Secondary | ICD-10-CM | POA: Diagnosis not present

## 2014-05-04 DIAGNOSIS — I10 Essential (primary) hypertension: Secondary | ICD-10-CM | POA: Diagnosis not present

## 2014-05-04 DIAGNOSIS — F339 Major depressive disorder, recurrent, unspecified: Secondary | ICD-10-CM | POA: Diagnosis not present

## 2014-05-04 DIAGNOSIS — K311 Adult hypertrophic pyloric stenosis: Secondary | ICD-10-CM | POA: Diagnosis not present

## 2014-05-04 NOTE — Telephone Encounter (Signed)
Daughter [name]) called with concerns regarding her sodium level last time due to her BP was a little high this morning (171/60). She also wanted to confirm that the home health agency was going to come out to get her mother's BMP this week. She stated that we would continue to keep a eye on her BP with the agency and she would give the office a call if there are any changes.

## 2014-05-05 DIAGNOSIS — E785 Hyperlipidemia, unspecified: Secondary | ICD-10-CM | POA: Diagnosis not present

## 2014-05-05 DIAGNOSIS — I10 Essential (primary) hypertension: Secondary | ICD-10-CM | POA: Diagnosis not present

## 2014-05-05 DIAGNOSIS — E222 Syndrome of inappropriate secretion of antidiuretic hormone: Secondary | ICD-10-CM | POA: Diagnosis not present

## 2014-05-05 DIAGNOSIS — E1143 Type 2 diabetes mellitus with diabetic autonomic (poly)neuropathy: Secondary | ICD-10-CM | POA: Diagnosis not present

## 2014-05-05 DIAGNOSIS — F339 Major depressive disorder, recurrent, unspecified: Secondary | ICD-10-CM | POA: Diagnosis not present

## 2014-05-05 DIAGNOSIS — K311 Adult hypertrophic pyloric stenosis: Secondary | ICD-10-CM | POA: Diagnosis not present

## 2014-05-06 DIAGNOSIS — E222 Syndrome of inappropriate secretion of antidiuretic hormone: Secondary | ICD-10-CM | POA: Diagnosis not present

## 2014-05-06 DIAGNOSIS — K311 Adult hypertrophic pyloric stenosis: Secondary | ICD-10-CM | POA: Diagnosis not present

## 2014-05-06 DIAGNOSIS — I10 Essential (primary) hypertension: Secondary | ICD-10-CM | POA: Diagnosis not present

## 2014-05-06 DIAGNOSIS — F339 Major depressive disorder, recurrent, unspecified: Secondary | ICD-10-CM | POA: Diagnosis not present

## 2014-05-06 DIAGNOSIS — E785 Hyperlipidemia, unspecified: Secondary | ICD-10-CM | POA: Diagnosis not present

## 2014-05-06 DIAGNOSIS — E1143 Type 2 diabetes mellitus with diabetic autonomic (poly)neuropathy: Secondary | ICD-10-CM | POA: Diagnosis not present

## 2014-05-06 LAB — BASIC METABOLIC PANEL
BUN: 30 mg/dL — AB (ref 4–21)
Creatinine: 1.3 mg/dL — AB (ref 0.5–1.1)
Glucose: 126 mg/dL
Potassium: 4.7 mmol/L (ref 3.4–5.3)
Sodium: 133 mmol/L — AB (ref 137–147)

## 2014-05-07 ENCOUNTER — Encounter: Payer: Self-pay | Admitting: *Deleted

## 2014-05-07 DIAGNOSIS — F339 Major depressive disorder, recurrent, unspecified: Secondary | ICD-10-CM | POA: Diagnosis not present

## 2014-05-07 DIAGNOSIS — E1143 Type 2 diabetes mellitus with diabetic autonomic (poly)neuropathy: Secondary | ICD-10-CM | POA: Diagnosis not present

## 2014-05-07 DIAGNOSIS — E222 Syndrome of inappropriate secretion of antidiuretic hormone: Secondary | ICD-10-CM | POA: Diagnosis not present

## 2014-05-07 DIAGNOSIS — K311 Adult hypertrophic pyloric stenosis: Secondary | ICD-10-CM | POA: Diagnosis not present

## 2014-05-07 DIAGNOSIS — I10 Essential (primary) hypertension: Secondary | ICD-10-CM | POA: Diagnosis not present

## 2014-05-07 DIAGNOSIS — E785 Hyperlipidemia, unspecified: Secondary | ICD-10-CM | POA: Diagnosis not present

## 2014-05-12 ENCOUNTER — Other Ambulatory Visit: Payer: Self-pay | Admitting: Internal Medicine

## 2014-05-13 DIAGNOSIS — F339 Major depressive disorder, recurrent, unspecified: Secondary | ICD-10-CM | POA: Diagnosis not present

## 2014-05-13 DIAGNOSIS — K311 Adult hypertrophic pyloric stenosis: Secondary | ICD-10-CM | POA: Diagnosis not present

## 2014-05-13 DIAGNOSIS — E1143 Type 2 diabetes mellitus with diabetic autonomic (poly)neuropathy: Secondary | ICD-10-CM | POA: Diagnosis not present

## 2014-05-13 DIAGNOSIS — E785 Hyperlipidemia, unspecified: Secondary | ICD-10-CM | POA: Diagnosis not present

## 2014-05-13 DIAGNOSIS — E222 Syndrome of inappropriate secretion of antidiuretic hormone: Secondary | ICD-10-CM | POA: Diagnosis not present

## 2014-05-13 DIAGNOSIS — I10 Essential (primary) hypertension: Secondary | ICD-10-CM | POA: Diagnosis not present

## 2014-05-15 DIAGNOSIS — F339 Major depressive disorder, recurrent, unspecified: Secondary | ICD-10-CM | POA: Diagnosis not present

## 2014-05-15 DIAGNOSIS — I10 Essential (primary) hypertension: Secondary | ICD-10-CM | POA: Diagnosis not present

## 2014-05-15 DIAGNOSIS — K311 Adult hypertrophic pyloric stenosis: Secondary | ICD-10-CM | POA: Diagnosis not present

## 2014-05-15 DIAGNOSIS — E1143 Type 2 diabetes mellitus with diabetic autonomic (poly)neuropathy: Secondary | ICD-10-CM | POA: Diagnosis not present

## 2014-05-15 DIAGNOSIS — E222 Syndrome of inappropriate secretion of antidiuretic hormone: Secondary | ICD-10-CM | POA: Diagnosis not present

## 2014-05-15 DIAGNOSIS — E785 Hyperlipidemia, unspecified: Secondary | ICD-10-CM | POA: Diagnosis not present

## 2014-05-16 ENCOUNTER — Other Ambulatory Visit: Payer: Self-pay | Admitting: Internal Medicine

## 2014-05-21 ENCOUNTER — Telehealth: Payer: Self-pay | Admitting: *Deleted

## 2014-05-21 ENCOUNTER — Inpatient Hospital Stay (HOSPITAL_COMMUNITY)
Admission: EM | Admit: 2014-05-21 | Discharge: 2014-05-30 | DRG: 380 | Disposition: A | Discharge status: Home / Self Care | Payer: Medicare Other | Attending: Internal Medicine | Admitting: Internal Medicine

## 2014-05-21 ENCOUNTER — Emergency Department (HOSPITAL_COMMUNITY): Payer: Medicare Other

## 2014-05-21 ENCOUNTER — Encounter (HOSPITAL_COMMUNITY): Payer: Self-pay | Admitting: *Deleted

## 2014-05-21 DIAGNOSIS — E1159 Type 2 diabetes mellitus with other circulatory complications: Secondary | ICD-10-CM | POA: Diagnosis not present

## 2014-05-21 DIAGNOSIS — E1165 Type 2 diabetes mellitus with hyperglycemia: Secondary | ICD-10-CM | POA: Diagnosis present

## 2014-05-21 DIAGNOSIS — K311 Adult hypertrophic pyloric stenosis: Principal | ICD-10-CM | POA: Diagnosis present

## 2014-05-21 DIAGNOSIS — E871 Hypo-osmolality and hyponatremia: Secondary | ICD-10-CM | POA: Diagnosis present

## 2014-05-21 DIAGNOSIS — Z8673 Personal history of transient ischemic attack (TIA), and cerebral infarction without residual deficits: Secondary | ICD-10-CM | POA: Diagnosis not present

## 2014-05-21 DIAGNOSIS — Z823 Family history of stroke: Secondary | ICD-10-CM

## 2014-05-21 DIAGNOSIS — G629 Polyneuropathy, unspecified: Secondary | ICD-10-CM | POA: Diagnosis present

## 2014-05-21 DIAGNOSIS — D649 Anemia, unspecified: Secondary | ICD-10-CM | POA: Diagnosis present

## 2014-05-21 DIAGNOSIS — Z6821 Body mass index (BMI) 21.0-21.9, adult: Secondary | ICD-10-CM

## 2014-05-21 DIAGNOSIS — Z79899 Other long term (current) drug therapy: Secondary | ICD-10-CM | POA: Diagnosis not present

## 2014-05-21 DIAGNOSIS — Z7982 Long term (current) use of aspirin: Secondary | ICD-10-CM

## 2014-05-21 DIAGNOSIS — N179 Acute kidney failure, unspecified: Secondary | ICD-10-CM | POA: Diagnosis present

## 2014-05-21 DIAGNOSIS — I10 Essential (primary) hypertension: Secondary | ICD-10-CM | POA: Diagnosis present

## 2014-05-21 DIAGNOSIS — E1122 Type 2 diabetes mellitus with diabetic chronic kidney disease: Secondary | ICD-10-CM | POA: Diagnosis present

## 2014-05-21 DIAGNOSIS — Z7951 Long term (current) use of inhaled steroids: Secondary | ICD-10-CM | POA: Diagnosis not present

## 2014-05-21 DIAGNOSIS — Z4682 Encounter for fitting and adjustment of non-vascular catheter: Secondary | ICD-10-CM | POA: Diagnosis not present

## 2014-05-21 DIAGNOSIS — K7689 Other specified diseases of liver: Secondary | ICD-10-CM | POA: Diagnosis not present

## 2014-05-21 DIAGNOSIS — K5660 Unspecified intestinal obstruction: Secondary | ICD-10-CM | POA: Diagnosis not present

## 2014-05-21 DIAGNOSIS — E119 Type 2 diabetes mellitus without complications: Secondary | ICD-10-CM

## 2014-05-21 DIAGNOSIS — E039 Hypothyroidism, unspecified: Secondary | ICD-10-CM | POA: Diagnosis present

## 2014-05-21 DIAGNOSIS — E038 Other specified hypothyroidism: Secondary | ICD-10-CM | POA: Diagnosis not present

## 2014-05-21 DIAGNOSIS — E43 Unspecified severe protein-calorie malnutrition: Secondary | ICD-10-CM | POA: Diagnosis present

## 2014-05-21 DIAGNOSIS — Z66 Do not resuscitate: Secondary | ICD-10-CM | POA: Diagnosis present

## 2014-05-21 DIAGNOSIS — E785 Hyperlipidemia, unspecified: Secondary | ICD-10-CM | POA: Diagnosis present

## 2014-05-21 DIAGNOSIS — K3189 Other diseases of stomach and duodenum: Secondary | ICD-10-CM | POA: Diagnosis not present

## 2014-05-21 LAB — CBC WITH DIFFERENTIAL/PLATELET
Basophils Absolute: 0 10*3/uL (ref 0.0–0.1)
Basophils Relative: 0 % (ref 0–1)
Eosinophils Absolute: 0 10*3/uL (ref 0.0–0.7)
Eosinophils Relative: 1 % (ref 0–5)
HCT: 31 % — ABNORMAL LOW (ref 36.0–46.0)
Hemoglobin: 10.8 g/dL — ABNORMAL LOW (ref 12.0–15.0)
Lymphocytes Relative: 14 % (ref 12–46)
Lymphs Abs: 0.8 10*3/uL (ref 0.7–4.0)
MCH: 30 pg (ref 26.0–34.0)
MCHC: 34.8 g/dL (ref 30.0–36.0)
MCV: 86.1 fL (ref 78.0–100.0)
Monocytes Absolute: 0.6 10*3/uL (ref 0.1–1.0)
Monocytes Relative: 10 % (ref 3–12)
Neutro Abs: 4.4 10*3/uL (ref 1.7–7.7)
Neutrophils Relative %: 75 % (ref 43–77)
Platelets: 287 10*3/uL (ref 150–400)
RBC: 3.6 MIL/uL — ABNORMAL LOW (ref 3.87–5.11)
RDW: 16.4 % — ABNORMAL HIGH (ref 11.5–15.5)
WBC: 5.8 10*3/uL (ref 4.0–10.5)

## 2014-05-21 LAB — URINALYSIS, ROUTINE W REFLEX MICROSCOPIC
Bilirubin Urine: NEGATIVE
Glucose, UA: NEGATIVE mg/dL
Hgb urine dipstick: NEGATIVE
Ketones, ur: 15 mg/dL — AB
Nitrite: NEGATIVE
Protein, ur: 100 mg/dL — AB
Specific Gravity, Urine: 1.019 (ref 1.005–1.030)
Urobilinogen, UA: 0.2 mg/dL (ref 0.0–1.0)
pH: 7.5 (ref 5.0–8.0)

## 2014-05-21 LAB — LIPASE, BLOOD: Lipase: 38 U/L (ref 11–59)

## 2014-05-21 LAB — COMPREHENSIVE METABOLIC PANEL
ALT: 24 U/L (ref 0–35)
AST: 39 U/L — ABNORMAL HIGH (ref 0–37)
Albumin: 4.2 g/dL (ref 3.5–5.2)
Alkaline Phosphatase: 73 U/L (ref 39–117)
Anion gap: 9 (ref 5–15)
BUN: 21 mg/dL (ref 6–23)
CO2: 26 mmol/L (ref 19–32)
Calcium: 11 mg/dL — ABNORMAL HIGH (ref 8.4–10.5)
Chloride: 95 mmol/L — ABNORMAL LOW (ref 96–112)
Creatinine, Ser: 1.62 mg/dL — ABNORMAL HIGH (ref 0.50–1.10)
GFR calc Af Amer: 32 mL/min — ABNORMAL LOW (ref >90)
GFR calc non Af Amer: 27 mL/min — ABNORMAL LOW (ref >90)
Glucose, Bld: 110 mg/dL — ABNORMAL HIGH (ref 70–99)
Potassium: 3.9 mmol/L (ref 3.5–5.1)
Sodium: 130 mmol/L — ABNORMAL LOW (ref 135–145)
Total Bilirubin: 0.9 mg/dL (ref 0.3–1.2)
Total Protein: 7.7 g/dL (ref 6.0–8.3)

## 2014-05-21 LAB — URINE MICROSCOPIC-ADD ON

## 2014-05-21 MED ORDER — SERTRALINE HCL 100 MG PO TABS
100.0000 mg | ORAL_TABLET | Freq: Every morning | ORAL | Status: DC
  Administered 2014-05-22 – 2014-05-23 (×2): 100 mg via ORAL
  Filled 2014-05-21 (×2): qty 1

## 2014-05-21 MED ORDER — GABAPENTIN 300 MG PO CAPS
300.0000 mg | ORAL_CAPSULE | Freq: Three times a day (TID) | ORAL | Status: DC
  Administered 2014-05-22: 300 mg via ORAL
  Filled 2014-05-21: qty 1

## 2014-05-21 MED ORDER — SODIUM CHLORIDE 0.9 % IV BOLUS (SEPSIS)
500.0000 mL | Freq: Once | INTRAVENOUS | Status: AC
  Administered 2014-05-21: 500 mL via INTRAVENOUS

## 2014-05-21 MED ORDER — ONDANSETRON HCL 4 MG/2ML IJ SOLN
4.0000 mg | Freq: Once | INTRAMUSCULAR | Status: AC
  Administered 2014-05-21: 4 mg via INTRAVENOUS
  Filled 2014-05-21: qty 2

## 2014-05-21 MED ORDER — CLONIDINE HCL 0.3 MG/24HR TD PTWK
0.3000 mg | MEDICATED_PATCH | TRANSDERMAL | Status: DC
  Administered 2014-05-22 – 2014-05-29 (×2): 0.3 mg via TRANSDERMAL
  Filled 2014-05-21 (×2): qty 1

## 2014-05-21 MED ORDER — SODIUM CHLORIDE 0.9 % IV SOLN
INTRAVENOUS | Status: DC
  Administered 2014-05-21: 125 mL/h via INTRAVENOUS

## 2014-05-21 MED ORDER — LISINOPRIL 10 MG PO TABS: 10.0000 mg | ORAL_TABLET | Freq: Every day | ORAL | Status: DC

## 2014-05-21 MED ORDER — METOPROLOL SUCCINATE ER 25 MG PO TB24: 25.0000 mg | ORAL_TABLET | Freq: Every day | ORAL | Status: DC

## 2014-05-21 MED ORDER — LATANOPROST 0.005 % OP SOLN
1.0000 [drp] | Freq: Every day | OPHTHALMIC | Status: DC
  Administered 2014-05-22 – 2014-05-29 (×8): 1 [drp] via OPHTHALMIC
  Filled 2014-05-21: qty 2.5

## 2014-05-21 MED ORDER — PANTOPRAZOLE SODIUM 40 MG IV SOLR
40.0000 mg | Freq: Two times a day (BID) | INTRAVENOUS | Status: DC
  Administered 2014-05-22 – 2014-05-26 (×9): 40 mg via INTRAVENOUS
  Filled 2014-05-21 (×7): qty 40

## 2014-05-21 MED ORDER — AMLODIPINE BESYLATE 10 MG PO TABS: 10.0000 mg | ORAL_TABLET | Freq: Every day | ORAL | Status: DC

## 2014-05-21 MED ORDER — LEVOTHYROXINE SODIUM 100 MCG IV SOLR
100.0000 ug | Freq: Every day | INTRAVENOUS | Status: DC
  Administered 2014-05-22 – 2014-05-26 (×5): 100 ug via INTRAVENOUS
  Filled 2014-05-21 (×5): qty 5

## 2014-05-21 MED ORDER — ATORVASTATIN CALCIUM 10 MG PO TABS: 10.0000 mg | ORAL_TABLET | Freq: Every day | ORAL | Status: DC

## 2014-05-21 MED ORDER — CHLORHEXIDINE GLUCONATE 0.12 % MT SOLN
15.0000 mL | Freq: Two times a day (BID) | OROMUCOSAL | Status: DC
  Administered 2014-05-22: 15 mL via OROMUCOSAL
  Filled 2014-05-21: qty 15

## 2014-05-21 MED ORDER — NITROGLYCERIN 0.2 MG/HR TD PT24
0.2000 mg | MEDICATED_PATCH | Freq: Every day | TRANSDERMAL | Status: DC
  Administered 2014-05-22 – 2014-05-30 (×9): 0.2 mg via TRANSDERMAL
  Filled 2014-05-21 (×9): qty 1

## 2014-05-21 MED ORDER — ISOSORBIDE MONONITRATE ER 30 MG PO TB24: 30.0000 mg | ORAL_TABLET | Freq: Every day | ORAL | Status: DC

## 2014-05-21 MED ORDER — HYDRALAZINE HCL 100 MG PO TABS: 100.0000 mg | ORAL_TABLET | Freq: Three times a day (TID) | ORAL | Status: DC

## 2014-05-21 MED ORDER — ALBUTEROL SULFATE HFA 108 (90 BASE) MCG/ACT IN AERS: 2.0000 | INHALATION_SPRAY | Freq: Four times a day (QID) | RESPIRATORY_TRACT | Status: DC | PRN

## 2014-05-21 MED ORDER — ASPIRIN EC 81 MG PO TBEC: 81.0000 mg | DELAYED_RELEASE_TABLET | Freq: Every morning | ORAL | Status: DC

## 2014-05-21 MED ORDER — ACETAMINOPHEN 325 MG PO TABS: 650.0000 mg | ORAL_TABLET | Freq: Four times a day (QID) | ORAL | Status: DC | PRN

## 2014-05-21 MED ORDER — LEVOTHYROXINE SODIUM 200 MCG PO TABS: 200.0000 ug | ORAL_TABLET | Freq: Every day | ORAL | Status: DC

## 2014-05-21 MED ORDER — BUDESONIDE-FORMOTEROL FUMARATE 160-4.5 MCG/ACT IN AERO: 2.0000 | INHALATION_SPRAY | Freq: Two times a day (BID) | RESPIRATORY_TRACT | Status: DC

## 2014-05-21 MED ORDER — SODIUM CHLORIDE 1 G PO TABS
1.0000 g | ORAL_TABLET | Freq: Three times a day (TID) | ORAL | Status: DC
  Filled 2014-05-21 (×4): qty 1

## 2014-05-21 MED ORDER — HEPARIN SODIUM (PORCINE) 5000 UNIT/ML IJ SOLN
5000.0000 [IU] | Freq: Three times a day (TID) | INTRAMUSCULAR | Status: DC
  Administered 2014-05-22 – 2014-05-30 (×26): 5000 [IU] via SUBCUTANEOUS
  Filled 2014-05-21 (×23): qty 1

## 2014-05-21 MED ORDER — PANTOPRAZOLE SODIUM 40 MG IV SOLR
40.0000 mg | Freq: Once | INTRAVENOUS | Status: AC
  Administered 2014-05-22: 40 mg via INTRAVENOUS
  Filled 2014-05-21: qty 40

## 2014-05-21 MED ORDER — CYCLOSPORINE 0.05 % OP EMUL
1.0000 [drp] | Freq: Two times a day (BID) | OPHTHALMIC | Status: DC
  Administered 2014-05-22 – 2014-05-30 (×17): 1 [drp] via OPHTHALMIC
  Filled 2014-05-21 (×18): qty 1

## 2014-05-21 MED ORDER — SODIUM CHLORIDE 0.9 % IV SOLN
INTRAVENOUS | Status: DC
  Administered 2014-05-22 (×2): via INTRAVENOUS

## 2014-05-21 MED ORDER — POLYVINYL ALCOHOL 1.4 % OP SOLN
1.0000 [drp] | OPHTHALMIC | Status: DC | PRN
  Administered 2014-05-24 – 2014-05-29 (×3): 1 [drp] via OPHTHALMIC
  Filled 2014-05-21: qty 15

## 2014-05-21 MED ORDER — PANTOPRAZOLE SODIUM 40 MG PO TBEC: 40.0000 mg | DELAYED_RELEASE_TABLET | Freq: Two times a day (BID) | ORAL | Status: DC

## 2014-05-21 NOTE — ED Notes (Signed)
Pt has returned from [place].  

## 2014-05-21 NOTE — Telephone Encounter (Signed)
[name], Caregiver called and stated that patient is having severe stomach pains with vomiting and spitting up. Having trouble also going to bathroom for BM. No appointments available to day with our providers. Instructed caregiver to take patient to urgent Care to be evaluated. She agreed.

## 2014-05-21 NOTE — ED Notes (Signed)
Attempted Gastric Tube x3. Unable to access.

## 2014-05-21 NOTE — ED Notes (Signed)
Admitting MD at the bedside.  

## 2014-05-21 NOTE — ED Notes (Signed)
Pt reports abdominal pain with n/v, and a cough for a week. Pt denies diarrhea, fever, chills at home.

## 2014-05-21 NOTE — ED Notes (Signed)
Pt monitored by pulse ox, bp cuff, and 5-lead. 

## 2014-05-21 NOTE — Telephone Encounter (Signed)
Noted thanks °

## 2014-05-21 NOTE — ED Notes (Signed)
MD Pfeiffer at the bedside 

## 2014-05-21 NOTE — H&P (Signed)
[facility] History and Physical  Shannon [name] MWN:[id number] DOB: 1923/12/10 DOA: 05/21/2014  Referring physician: EDP PCP: Oneal Grout, MD   Chief Complaint: Abd pain, N/V   HPI: Shannon [name] is a 81 y.o. female with history of known gastric outlet obstruction probably related to a perforated pyloric / duodenal ulcer she had in the summer last year.  Was most recently seen in December of last year for this problem and underwent upper EGD at that time, tortous outlet but mucosa normal, see Dr. Larae Grooms EGD note.  Patient is on a liquid diet only since that time; however, at times patient does want to eat some more normal food.  On Monday she had some sausage in small pieces and on the next day had some pastry.  Since then has had increasing abdominal pain and distention until finally she presents to the ED today with N/V.  Review of Systems: Systems reviewed.  As above, otherwise negative  Past Medical History  Diagnosis Date  . Pneumonia   . Thyroid disease   . Seizures   . Stroke   . Hyperlipidemia   . Hypertension   . Diabetes mellitus without complication   . Small bowel obstruction   . Ventral hernia with bowel obstruction    Past Surgical History  Procedure Laterality Date  . Exploratory laparotomy  09-07-2013  . Cholecystectomy    . Tubal ligation    . Colectomy    . Hernia repair  2015  . Esophagogastroduodenoscopy N/A 01/19/2014    Procedure: ESOPHAGOGASTRODUODENOSCOPY (EGD);  Surgeon: Hilarie Fredrickson, MD;  Location: [facility];  Service: Endoscopy;  Laterality: N/A;  . Esophagogastroduodenoscopy N/A 01/20/2014    Procedure: ESOPHAGOGASTRODUODENOSCOPY (EGD);  Surgeon: Hilarie Fredrickson, MD;  Location: [facility];  Service: Endoscopy;  Laterality: N/A;  . Esophagogastroduodenoscopy N/A 03/19/2014    Procedure: ESOPHAGOGASTRODUODENOSCOPY (EGD);  Surgeon: Rachael Fee, MD;  Location: [facility];  Service: Endoscopy;  Laterality: N/A;   Social History:  reports  that she has never smoked. She does not have any smokeless tobacco history on file. She reports that she does not drink alcohol or use illicit drugs.  No Known Allergies  Family History  Problem Relation Age of Onset  . Stroke Mother   . Heart attack Neg Hx   . Hypertension Mother   . Diabetes Brother      Prior to Admission medications   Medication Sig Start Date End Date Taking? Authorizing Provider  acetaminophen (TYLENOL) 325 MG tablet Take 650 mg by mouth every 6 (six) hours as needed for mild pain or fever.   Yes Historical Provider, MD  albuterol (PROVENTIL HFA;VENTOLIN HFA) 108 (90 BASE) MCG/ACT inhaler Inhale 2 puffs into the lungs every 6 (six) hours as needed for wheezing or shortness of breath. 04/08/14  Yes Lars Masson, MD  amLODipine (NORVASC) 10 MG tablet Take 10 mg by mouth daily. For HTN   Yes Historical Provider, MD  aspirin EC 81 MG tablet Take 81 mg by mouth every morning.   Yes Historical Provider, MD  atorvastatin (LIPITOR) 10 MG tablet TAKE 1 TABLET BY MOUTH EVERY DAY 05/18/14  Yes Mahima Glade Lloyd, MD  calcium-vitamin D (OSCAL WITH D) 500-200 MG-UNIT per tablet Take 1 tablet by mouth daily with breakfast.    Yes Historical Provider, MD  chlorhexidine (PERIDEX) 0.12 % solution 15 mLs by Mouth Rinse route 2 (two) times daily. 03/26/14  Yes Alison Murray, MD  cloNIDine (CATAPRES - DOSED IN MG/24 HR) 0.3  mg/24hr patch Place 0.3 mg onto the skin once a week.   Yes Historical Provider, MD  cycloSPORINE (RESTASIS) 0.05 % ophthalmic emulsion Place 1 drop into both eyes 2 (two) times daily.   Yes Historical Provider, MD  fluticasone (FLONASE) 50 MCG/ACT nasal spray Place 2 sprays into both nostrils daily.   Yes Historical Provider, MD  gabapentin (NEURONTIN) 300 MG capsule TAKE ONE CAPSULE BY MOUTH 3 TIMES A DAY 05/13/14  Yes Mahima Pandey, MD  hydrALAZINE (APRESOLINE) 100 MG tablet TAKE 1 TABLET BY MOUTH 3 TIMES A DAY 05/18/14  Yes Mahima Pandey, MD  isosorbide mononitrate  (IMDUR) 30 MG 24 hr tablet Take 1 tablet (30 mg total) by mouth daily. 04/08/14  Yes Lars Masson, MD  l-methylfolate-B6-B12 (METANX) 3-35-2 MG TABS Take 1 tablet by mouth every morning.   Yes Historical Provider, MD  latanoprost (XALATAN) 0.005 % ophthalmic solution Place 1 drop into the right eye at bedtime. Patient taking differently: Place 1 drop into both eyes at bedtime.  03/26/14  Yes Alison Murray, MD  levothyroxine (SYNTHROID, LEVOTHROID) 200 MCG tablet TAKE 1 TABLET BY MOUTH EVERY DAY 05/13/14  Yes Mahima Pandey, MD  lisinopril (PRINIVIL,ZESTRIL) 10 MG tablet TAKE 1 TABLET BY MOUTH EVERY DAY 05/13/14  Yes Oneal Grout, MD  metoprolol succinate (TOPROL-XL) 25 MG 24 hr tablet Take 25 mg by mouth daily.   Yes Historical Provider, MD  Multiple Vitamins-Minerals (MULTIVITAMIN WITH MINERALS) tablet Take 1 tablet by mouth every morning.    Yes Historical Provider, MD  nitroGLYCERIN (NITRODUR - DOSED IN MG/24 HR) 0.2 mg/hr patch APPLY ONE PATCH EVERY AM AND REMOVE AT BEDTIME 05/18/14  Yes Mahima Pandey, MD  pantoprazole (PROTONIX) 40 MG tablet Take 1 tablet (40 mg total) by mouth 2 (two) times daily. 03/26/14  Yes Alison Murray, MD  polyethylene glycol [facility] / Ethelene Hal) packet Take 17 g by mouth daily.   Yes Historical Provider, MD  polyvinyl alcohol (LIQUIFILM TEARS) 1.4 % ophthalmic solution Place 1 drop into both eyes as needed for dry eyes. 03/26/14  Yes Alison Murray, MD  sennosides-docusate sodium (SENOKOT-S) 8.6-50 MG tablet Take 1 tablet by mouth 2 (two) times daily.    Yes Historical Provider, MD  sertraline (ZOLOFT) 100 MG tablet Take 100 mg by mouth every morning. For depression   Yes Historical Provider, MD  sodium chloride 1 G tablet Take 1 tablet (1 g total) by mouth 3 (three) times daily with meals. 03/31/14  Yes Penny Pia, MD  sucralfate (CARAFATE) 1 G tablet TAKE 1 TABLET BY MOUTH 3 TIMES A DAY WITH MEALS 05/18/14  Yes Oneal Grout, MD  SYMBICORT 160-4.5 MCG/ACT inhaler  INHALE 2 PUFFS TWICE A DAY 05/18/14  Yes Mahima Pandey, MD  traMADol (ULTRAM) 50 MG tablet Take by mouth 2 (two) times daily as needed for moderate pain.   Yes Historical Provider, MD   Physical Exam: Filed Vitals:   05/21/14 2315  BP: 178/96  Pulse: 82  Temp:   Resp: 18    BP 178/96 mmHg  Pulse 82  Temp(Src) 98.9 F (37.2 C) (Oral)  Resp 18  SpO2 98%  General Appearance:    Alert, oriented, no distress, appears stated age  Head:    Normocephalic, atraumatic  Eyes:    PERRL, EOMI, sclera non-icteric        Nose:   Nares without drainage or epistaxis. Mucosa, turbinates normal  Throat:   Moist mucous membranes. Oropharynx without erythema or exudate.  Neck:  Supple. No carotid bruits.  No thyromegaly.  No lymphadenopathy.   Back:     No CVA tenderness, no spinal tenderness  Lungs:     Clear to auscultation bilaterally, without wheezes, rhonchi or rales  Chest wall:    No tenderness to palpitation  Heart:    Regular rate and rhythm without murmurs, gallops, rubs  Abdomen:     Soft, non-tender, nondistended, normal bowel sounds, no organomegaly  Genitalia:    deferred  Rectal:    deferred  Extremities:   No clubbing, cyanosis or edema.  Pulses:   2+ and symmetric all extremities  Skin:   Skin color, texture, turgor normal, no rashes or lesions  Lymph nodes:   Cervical, supraclavicular, and axillary nodes normal  Neurologic:   CNII-XII intact. Normal strength, sensation and reflexes      throughout    Labs on Admission:  Basic Metabolic Panel:  Recent Labs Lab 05/21/14 1636  NA 130*  K 3.9  CL 95*  CO2 26  GLUCOSE 110*  BUN 21  CREATININE 1.62*  CALCIUM 11.0*   Liver Function Tests:  Recent Labs Lab 05/21/14 1636  AST 39*  ALT 24  ALKPHOS 73  BILITOT 0.9  PROT 7.7  ALBUMIN 4.2    Recent Labs Lab 05/21/14 1636  LIPASE 38   No results for input(s): AMMONIA in the last 168 hours. CBC:  Recent Labs Lab 05/21/14 1636  WBC 5.8  NEUTROABS 4.4   HGB 10.8*  HCT 31.0*  MCV 86.1  PLT 287   Cardiac Enzymes: No results for input(s): CKTOTAL, CKMB, CKMBINDEX, TROPONINI in the last 168 hours.  BNP (last 3 results)  Recent Labs  01/17/14 2116  PROBNP 961.6*   CBG: No results for input(s): GLUCAP in the last 168 hours.  Radiological Exams on Admission: Ct Abdomen Pelvis Wo Contrast  05/21/2014   CLINICAL DATA:  Nausea and vomiting for 3 days, abdominal pain and distension, history of multiple hernias, volvulus, cholecystectomy, diverticulitis, stroke, seizure, hyperlipidemia, small bowel obstruction, ventral hernia, chronic kidney disease  EXAM: CT ABDOMEN AND PELVIS WITHOUT CONTRAST  TECHNIQUE: Multidetector CT imaging of the abdomen and pelvis was performed following the standard protocol without IV contrast. Sagittal and coronal MPR images reconstructed from axial data set.  COMPARISON:  03/12/2014  FINDINGS: Minimal atelectasis at lung bases.  Small pericardial effusion.  Gallbladder surgically absent.  Multiple low-attenuation foci within liver similar to prior exams, some which are present small cyst, additional of which are indeterminate.  No new abnormalities of the liver, spleen, atrophic pancreas, kidneys, or adrenal glands.  Marked gastric distention containing fluid and food debris.  Significant pyloric thickening again identified consistent with gastric outlet obstruction.  This could be due to peptic ulcer disease with associated mural edema or mass/tumor.  RIGHT parasagittal ventral hernia containing a nonobstructed bowel loop.  Large and small bowel loops otherwise unremarkable.  Extensive atherosclerotic disease.  No definite evidence of bowel obstruction or perforation.  Unremarkable bladder, uterus and adnexae.  Bones demineralized with chronic superior endplate compression deformity of T11 unchanged.  IMPRESSION: Marked gastric distention with stomach filled with fluid and food debris and associated with thickening of the  pyloric wall consistent with gastric outlet obstruction, question due to peptic ulcer disease with edema or mass/tumor.  Stable low to intermediate attenuation lesions within the liver.  Extensive atherosclerotic disease.   Electronically Signed   By: Ulyses Southward M.D.   On: 05/21/2014 20:43   Dg Abd Portable  1v  05/21/2014   CLINICAL DATA:  NG tube placement.  EXAM: PORTABLE ABDOMEN - 1 VIEW  COMPARISON:  None.  FINDINGS: NG tube is in place the side port above the gastroesophageal junction. Recommend advancement 7 cm.  IMPRESSION: As above.   Electronically Signed   By: Drusilla Kanner M.D.   On: 05/21/2014 22:30    EKG: Independently reviewed.  Assessment/Plan Principal Problem:   Gastric outlet obstruction Active Problems:   DM (diabetes mellitus) type II uncontrolled, periph vascular disorder   Hypothyroidism   Accelerated hypertension   1. Gastric outlet obstruction - Initially very distended abdomen in the ED, CT abd/pelvis confirms massive amount of retained stomach contents.  NGT is placed and over 900 cc and counting have been suctioned from the patients stomach in under the first hour so far.  Patients distention is improved and her stomach pain is also improved she states. 1. NPO 2. Pharm consult to convert PO meds to IV as able 3. GI consulted by EDP and coming to see patient and discuss options tomorrow.  Suspect EGD with perhaps some sort of stenting procedure? 2. Hypothyroidism - change synthroid to IV 3. DM2 - does not appear to be on any home meds for this at baseline 4. Accelerated HTN - pharm consult placed to convert the PO meds to IV as able.  Continue the NTG and clonadine patches.    Code Status: DNR - confirmed with patient Family Communication: No family in room, GI: call daughter before procedure to discuss. Disposition Plan: Admit to inpatient   Time spent: 70 min  GARDNER, JARED M. [facility] Pager [phone]  If 7AM-7PM, please contact the day  team taking care of the patient [email or web address] Password [facility] 05/21/2014, 11:24 PM

## 2014-05-21 NOTE — ED Provider Notes (Signed)
CSN: [id number]     Arrival date & time 05/21/14  1613 History   First MD Initiated Contact with Patient 05/21/14 1859     Chief Complaint  Patient presents with  . Abdominal Pain  . Nausea  . Emesis     (Consider location/radiation/quality/duration/timing/severity/associated sxs/prior Treatment) HPI The patient has a known chronic gastric outlet obstructive problem. She is supposed to be on a liquid diet only. Her daughter reports that sometimes however she does want to eat more regular food. She reports Monday she had cream of wheat but she also had some sausage in very small pieces and on subsequent day she had some pastry. The patient has gotten increasing abdominal pain and distention. She gags and brings up thin material and food. She reports is made worse by lying flat. Her daughter notes that her abdomen has become very distended. Past Medical History  Diagnosis Date  . Pneumonia   . Thyroid disease   . Seizures   . Stroke   . Hyperlipidemia   . Hypertension   . Diabetes mellitus without complication   . Small bowel obstruction   . Ventral hernia with bowel obstruction    Past Surgical History  Procedure Laterality Date  . Exploratory laparotomy  09-07-2013  . Cholecystectomy    . Tubal ligation    . Colectomy    . Hernia repair  2015  . Esophagogastroduodenoscopy N/A 01/19/2014    Procedure: ESOPHAGOGASTRODUODENOSCOPY (EGD);  Surgeon: Irene Shipper, MD;  Location: [facility];  Service: Endoscopy;  Laterality: N/A;  . Esophagogastroduodenoscopy N/A 01/20/2014    Procedure: ESOPHAGOGASTRODUODENOSCOPY (EGD);  Surgeon: Irene Shipper, MD;  Location: [facility];  Service: Endoscopy;  Laterality: N/A;  . Esophagogastroduodenoscopy N/A 03/19/2014    Procedure: ESOPHAGOGASTRODUODENOSCOPY (EGD);  Surgeon: Milus Banister, MD;  Location: [facility];  Service: Endoscopy;  Laterality: N/A;   Family History  Problem Relation Age of Onset  . Stroke Mother   . Heart attack Neg  Hx   . Hypertension Mother   . Diabetes Brother    History  Substance Use Topics  . Smoking status: Never Smoker   . Smokeless tobacco: Not on file  . Alcohol Use: No   OB History    No data available     Review of Systems  10 Systems reviewed and are negative for acute change except as noted in the HPI.   Allergies  Review of patient's allergies indicates no known allergies.  Home Medications   Prior to Admission medications   Medication Sig Start Date End Date Taking? Authorizing Provider  acetaminophen (TYLENOL) 325 MG tablet Take 650 mg by mouth every 6 (six) hours as needed for mild pain or fever.   Yes Historical Provider, MD  albuterol (PROVENTIL HFA;VENTOLIN HFA) 108 (90 BASE) MCG/ACT inhaler Inhale 2 puffs into the lungs every 6 (six) hours as needed for wheezing or shortness of breath. 04/08/14  Yes Dorothy Spark, MD  amLODipine (NORVASC) 10 MG tablet Take 10 mg by mouth daily. For HTN   Yes Historical Provider, MD  aspirin EC 81 MG tablet Take 81 mg by mouth every morning.   Yes Historical Provider, MD  atorvastatin (LIPITOR) 10 MG tablet TAKE 1 TABLET BY MOUTH EVERY DAY 05/18/14  Yes Mahima Pandey, MD  calcium-vitamin D (OSCAL WITH D) 500-200 MG-UNIT per tablet Take 1 tablet by mouth daily with breakfast.    Yes Historical Provider, MD  chlorhexidine (PERIDEX) 0.12 % solution 15 mLs by Mouth Rinse  route 2 (two) times daily. 03/26/14  Yes Robbie Lis, MD  cloNIDine (CATAPRES - DOSED IN MG/24 HR) 0.3 mg/24hr patch Place 0.3 mg onto the skin once a week.   Yes Historical Provider, MD  cycloSPORINE (RESTASIS) 0.05 % ophthalmic emulsion Place 1 drop into both eyes 2 (two) times daily.   Yes Historical Provider, MD  fluticasone (FLONASE) 50 MCG/ACT nasal spray Place 2 sprays into both nostrils daily.   Yes Historical Provider, MD  gabapentin (NEURONTIN) 300 MG capsule TAKE ONE CAPSULE BY MOUTH 3 TIMES A DAY 05/13/14  Yes Mahima Pandey, MD  hydrALAZINE (APRESOLINE) 100 MG  tablet TAKE 1 TABLET BY MOUTH 3 TIMES A DAY 05/18/14  Yes Mahima Pandey, MD  isosorbide mononitrate (IMDUR) 30 MG 24 hr tablet Take 1 tablet (30 mg total) by mouth daily. 04/08/14  Yes Dorothy Spark, MD  l-methylfolate-B6-B12 (METANX) 3-35-2 MG TABS Take 1 tablet by mouth every morning.   Yes Historical Provider, MD  latanoprost (XALATAN) 0.005 % ophthalmic solution Place 1 drop into the right eye at bedtime. Patient taking differently: Place 1 drop into both eyes at bedtime.  03/26/14  Yes Robbie Lis, MD  levothyroxine (SYNTHROID, LEVOTHROID) 200 MCG tablet TAKE 1 TABLET BY MOUTH EVERY DAY 05/13/14  Yes Mahima Pandey, MD  lisinopril (PRINIVIL,ZESTRIL) 10 MG tablet TAKE 1 TABLET BY MOUTH EVERY DAY 05/13/14  Yes Blanchie Serve, MD  metoprolol succinate (TOPROL-XL) 25 MG 24 hr tablet Take 25 mg by mouth daily.   Yes Historical Provider, MD  Multiple Vitamins-Minerals (MULTIVITAMIN WITH MINERALS) tablet Take 1 tablet by mouth every morning.    Yes Historical Provider, MD  nitroGLYCERIN (NITRODUR - DOSED IN MG/24 HR) 0.2 mg/hr patch APPLY ONE PATCH EVERY AM AND REMOVE AT BEDTIME 05/18/14  Yes Mahima Pandey, MD  pantoprazole (PROTONIX) 40 MG tablet Take 1 tablet (40 mg total) by mouth 2 (two) times daily. 03/26/14  Yes Robbie Lis, MD  polyethylene glycol [facility] / Floria Raveling) packet Take 17 g by mouth daily.   Yes Historical Provider, MD  polyvinyl alcohol (LIQUIFILM TEARS) 1.4 % ophthalmic solution Place 1 drop into both eyes as needed for dry eyes. 03/26/14  Yes Robbie Lis, MD  sennosides-docusate sodium (SENOKOT-S) 8.6-50 MG tablet Take 1 tablet by mouth 2 (two) times daily.    Yes Historical Provider, MD  sertraline (ZOLOFT) 100 MG tablet Take 100 mg by mouth every morning. For depression   Yes Historical Provider, MD  sodium chloride 1 G tablet Take 1 tablet (1 g total) by mouth 3 (three) times daily with meals. 03/31/14  Yes Velvet Bathe, MD  sucralfate (CARAFATE) 1 G tablet TAKE 1 TABLET BY  MOUTH 3 TIMES A DAY WITH MEALS 05/18/14  Yes Blanchie Serve, MD  SYMBICORT 160-4.5 MCG/ACT inhaler INHALE 2 PUFFS TWICE A DAY 05/18/14  Yes Mahima Pandey, MD  traMADol (ULTRAM) 50 MG tablet Take by mouth 2 (two) times daily as needed for moderate pain.   Yes Historical Provider, MD   BP 178/96 mmHg  Pulse 82  Temp(Src) 98.9 F (37.2 C) (Oral)  Resp 18  SpO2 98% Physical Exam  Constitutional:  The patient is an elderly female who is alert and nontoxic. She is intermittently gagging. He has no respiratory distress. Her voice is clear.  HENT:  Head: Normocephalic and atraumatic.  Mouth/Throat: Oropharynx is clear and moist. No oropharyngeal exudate.  Eyes: EOM are normal.  Neck: Neck supple.  Cardiovascular: Normal rate, regular rhythm, normal heart  sounds and intact distal pulses.   Pulmonary/Chest: Effort normal and breath sounds normal.  Abdominal: She exhibits distension.  The patient's abdomen is massively distended and hard. She is tender to palpation.  Musculoskeletal: Normal range of motion. She exhibits edema. She exhibits no tenderness.  Neurological: She is alert. Coordination normal.  Skin: Skin is warm and dry.  Psychiatric: She has a normal mood and affect.    ED Course  NG placement Date/Time: 05/21/2014 10:34 PM Performed by: Charlesetta Shanks Authorized by: Charlesetta Shanks Local anesthesia used: no Patient sedated: no Patient tolerance: Patient tolerated the procedure well with no immediate complications Comments: Nursing staff had attempted passage of an NG tube 3 times without success. Surgilube was applied to a 54 [place] NG tube. I was able to pass into the right nare without difficulty. The patient was in an upright seated position. She was unable to talk her chin and drink water so that the tube passed easily into her stomach at that point. A copious amount of tan thin liquid was returned from the NG tube.   (including critical care time) Labs Review Labs Reviewed   CBC WITH DIFFERENTIAL/PLATELET - Abnormal; Notable for the following:    RBC 3.60 (*)    Hemoglobin 10.8 (*)    HCT 31.0 (*)    RDW 16.4 (*)    All other components within normal limits  COMPREHENSIVE METABOLIC PANEL - Abnormal; Notable for the following:    Sodium 130 (*)    Chloride 95 (*)    Glucose, Bld 110 (*)    Creatinine, Ser 1.62 (*)    Calcium 11.0 (*)    AST 39 (*)    GFR calc non Af Amer 27 (*)    GFR calc Af Amer 32 (*)    All other components within normal limits  LIPASE, BLOOD  URINALYSIS, ROUTINE W REFLEX MICROSCOPIC  BASIC METABOLIC PANEL  CBC    Imaging Review Ct Abdomen Pelvis Wo Contrast  05/21/2014   CLINICAL DATA:  Nausea and vomiting for 3 days, abdominal pain and distension, history of multiple hernias, volvulus, cholecystectomy, diverticulitis, stroke, seizure, hyperlipidemia, small bowel obstruction, ventral hernia, chronic kidney disease  EXAM: CT ABDOMEN AND PELVIS WITHOUT CONTRAST  TECHNIQUE: Multidetector CT imaging of the abdomen and pelvis was performed following the standard protocol without IV contrast. Sagittal and coronal MPR images reconstructed from axial data set.  COMPARISON:  03/12/2014  FINDINGS: Minimal atelectasis at lung bases.  Small pericardial effusion.  Gallbladder surgically absent.  Multiple low-attenuation foci within liver similar to prior exams, some which are present small cyst, additional of which are indeterminate.  No new abnormalities of the liver, spleen, atrophic pancreas, kidneys, or adrenal glands.  Marked gastric distention containing fluid and food debris.  Significant pyloric thickening again identified consistent with gastric outlet obstruction.  This could be due to peptic ulcer disease with associated mural edema or mass/tumor.  RIGHT parasagittal ventral hernia containing a nonobstructed bowel loop.  Large and small bowel loops otherwise unremarkable.  Extensive atherosclerotic disease.  No definite evidence of bowel  obstruction or perforation.  Unremarkable bladder, uterus and adnexae.  Bones demineralized with chronic superior endplate compression deformity of T11 unchanged.  IMPRESSION: Marked gastric distention with stomach filled with fluid and food debris and associated with thickening of the pyloric wall consistent with gastric outlet obstruction, question due to peptic ulcer disease with edema or mass/tumor.  Stable low to intermediate attenuation lesions within the liver.  Extensive atherosclerotic disease.  Electronically Signed   By: Lavonia Dana M.D.   On: 05/21/2014 20:43   Dg Abd Portable 1v  05/21/2014   CLINICAL DATA:  NG tube placement.  EXAM: PORTABLE ABDOMEN - 1 VIEW  COMPARISON:  None.  FINDINGS: NG tube is in place the side port above the gastroesophageal junction. Recommend advancement 7 cm.  IMPRESSION: As above.   Electronically Signed   By: Inge Rise M.D.   On: 05/21/2014 22:30     EKG Interpretation None     Consult: [place] [facility]. Will evaluate the patient tomorrow for definitive management. MDM   Final diagnoses:  Gastric outlet obstruction   The patient is admitted to medical service for medical management. The patient's case has been reviewed with [facility] gastroenterology and they will further evaluate the patient as well to determine other treatment modalities.    Charlesetta Shanks, MD 05/21/14 2329

## 2014-05-22 DIAGNOSIS — E038 Other specified hypothyroidism: Secondary | ICD-10-CM

## 2014-05-22 DIAGNOSIS — K3189 Other diseases of stomach and duodenum: Secondary | ICD-10-CM

## 2014-05-22 LAB — BASIC METABOLIC PANEL
Anion gap: 4 — ABNORMAL LOW (ref 5–15)
BUN: 19 mg/dL (ref 6–23)
CO2: 29 mmol/L (ref 19–32)
Calcium: 9.9 mg/dL (ref 8.4–10.5)
Chloride: 98 mmol/L (ref 96–112)
Creatinine, Ser: 1.37 mg/dL — ABNORMAL HIGH (ref 0.50–1.10)
GFR calc Af Amer: 39 mL/min — ABNORMAL LOW (ref >90)
GFR calc non Af Amer: 33 mL/min — ABNORMAL LOW (ref >90)
Glucose, Bld: 112 mg/dL — ABNORMAL HIGH (ref 70–99)
Potassium: 3.9 mmol/L (ref 3.5–5.1)
Sodium: 131 mmol/L — ABNORMAL LOW (ref 135–145)

## 2014-05-22 LAB — CBC
HCT: 29.3 % — ABNORMAL LOW (ref 36.0–46.0)
Hemoglobin: 10.3 g/dL — ABNORMAL LOW (ref 12.0–15.0)
MCH: 30.4 pg (ref 26.0–34.0)
MCHC: 35.2 g/dL (ref 30.0–36.0)
MCV: 86.4 fL (ref 78.0–100.0)
Platelets: 269 10*3/uL (ref 150–400)
RBC: 3.39 MIL/uL — ABNORMAL LOW (ref 3.87–5.11)
RDW: 16.5 % — ABNORMAL HIGH (ref 11.5–15.5)
WBC: 6.2 10*3/uL (ref 4.0–10.5)

## 2014-05-22 MED ORDER — CHLORHEXIDINE GLUCONATE 0.12 % MT SOLN
15.0000 mL | Freq: Two times a day (BID) | OROMUCOSAL | Status: DC
  Administered 2014-05-22 – 2014-05-27 (×11): 15 mL via OROMUCOSAL
  Filled 2014-05-22 (×11): qty 15

## 2014-05-22 MED ORDER — ALBUTEROL SULFATE (2.5 MG/3ML) 0.083% IN NEBU: 2.5000 mg | INHALATION_SOLUTION | Freq: Four times a day (QID) | RESPIRATORY_TRACT | Status: DC | PRN

## 2014-05-22 MED ORDER — METOPROLOL TARTRATE 1 MG/ML IV SOLN
5.0000 mg | Freq: Four times a day (QID) | INTRAVENOUS | Status: DC
  Administered 2014-05-22 – 2014-05-26 (×13): 5 mg via INTRAVENOUS
  Filled 2014-05-22 (×23): qty 5

## 2014-05-22 MED ORDER — METOCLOPRAMIDE HCL 5 MG/ML IJ SOLN
10.0000 mg | Freq: Four times a day (QID) | INTRAMUSCULAR | Status: DC
  Administered 2014-05-23 – 2014-05-26 (×14): 10 mg via INTRAVENOUS
  Filled 2014-05-22 (×14): qty 2

## 2014-05-22 MED ORDER — CETYLPYRIDINIUM CHLORIDE 0.05 % MT LIQD
7.0000 mL | Freq: Two times a day (BID) | OROMUCOSAL | Status: DC
  Administered 2014-05-22 – 2014-05-27 (×11): 7 mL via OROMUCOSAL

## 2014-05-22 MED ORDER — KCL IN DEXTROSE-NACL 20-5-0.9 MEQ/L-%-% IV SOLN
INTRAVENOUS | Status: DC
  Administered 2014-05-22 – 2014-05-25 (×4): via INTRAVENOUS
  Filled 2014-05-22 (×8): qty 1000

## 2014-05-22 MED ORDER — GABAPENTIN 300 MG PO CAPS
300.0000 mg | ORAL_CAPSULE | Freq: Three times a day (TID) | ORAL | Status: DC
  Administered 2014-05-22 – 2014-05-30 (×24): 300 mg via ORAL
  Filled 2014-05-22 (×25): qty 1

## 2014-05-22 MED ORDER — HYDRALAZINE HCL 20 MG/ML IJ SOLN
10.0000 mg | Freq: Four times a day (QID) | INTRAMUSCULAR | Status: DC
  Administered 2014-05-22 – 2014-05-26 (×17): 10 mg via INTRAVENOUS
  Filled 2014-05-22 (×18): qty 1

## 2014-05-22 MED ORDER — BUDESONIDE-FORMOTEROL FUMARATE 160-4.5 MCG/ACT IN AERO
2.0000 | INHALATION_SPRAY | Freq: Two times a day (BID) | RESPIRATORY_TRACT | Status: DC
  Administered 2014-05-23 – 2014-05-30 (×15): 2 via RESPIRATORY_TRACT
  Filled 2014-05-22: qty 6

## 2014-05-22 MED ORDER — ACETAMINOPHEN 650 MG RE SUPP: 650.0000 mg | Freq: Four times a day (QID) | RECTAL | Status: DC | PRN

## 2014-05-22 NOTE — Progress Notes (Signed)
Received patient from ED.  Alert and oriented.  VS stable except elevated BP.

## 2014-05-22 NOTE — Progress Notes (Signed)
[facility] PROGRESS NOTE  Shannon [name] ZHY:[id number] DOB: 11-Jul-1923 DOA: 05/21/2014 PCP: Oneal Grout, MD  Assessment/Plan: 1-Gastric Outlet Obstruction:  S/P NG tube placement yielding 900cc of fluid.  NPO.  GI consulted.  IV fluids. IV protonix.   2-Hypothyroidism: Synthroid IV.   3-DM; does not appear to be on any home meds for this at baseline  4-HTN; clonidine patch. IV metoprolol. Hold lisinopril, Imdur, Norvasc while NPO. IV hydralazine.   5-DVT prophylaxis: heparin.  6-Hyponatremia; continue with IV fluids. Hold salt tablet.  7-History of seizure; not on medications. Patient relates she was taking off medications for seizure while back. She is on gabapentin for neuropathy.  Code Status: DNR Family Communication: Disposition Plan: Remain inpatient,.    Consultants:  GI.   Procedures:  none  Antibiotics:  none  HPI/Subjective: Patient relates mild improvement of abdominal pain, rate pain 8/10, better than yesterday  Objective: Filed Vitals:   05/22/14 0546  BP: 145/69  Pulse: 75  Temp: 98.1 F (36.7 C)  Resp: 16    Intake/Output Summary (Last 24 hours) at 05/22/14 0810 Last data filed at 05/22/14 0601  Gross per 24 hour  Intake 343.75 ml  Output   1450 ml  Net -1106.25 ml   Filed Weights   05/22/14 0104  Weight: 60.646 kg (133 lb 11.2 oz)    Exam:   General:  Alert in no distress, NGT in place.   Cardiovascular: S 1, S 2 RRR  Respiratory: CTA  Abdomen: BS distended, tenderness, no rigidity  Musculoskeletal: no edema.   Data Reviewed: Basic Metabolic Panel:  Recent Labs Lab 05/21/14 1636 05/22/14 0550  NA 130* 131*  K 3.9 3.9  CL 95* 98  CO2 26 29  GLUCOSE 110* 112*  BUN 21 19  CREATININE 1.62* 1.37*  CALCIUM 11.0* 9.9   Liver Function Tests:  Recent Labs Lab 05/21/14 1636  AST 39*  ALT 24  ALKPHOS 73  BILITOT 0.9  PROT 7.7  ALBUMIN 4.2    Recent Labs Lab 05/21/14 1636  LIPASE 38   No results  for input(s): AMMONIA in the last 168 hours. CBC:  Recent Labs Lab 05/21/14 1636 05/22/14 0550  WBC 5.8 6.2  NEUTROABS 4.4  --   HGB 10.8* 10.3*  HCT 31.0* 29.3*  MCV 86.1 86.4  PLT 287 269   Cardiac Enzymes: No results for input(s): CKTOTAL, CKMB, CKMBINDEX, TROPONINI in the last 168 hours. BNP (last 3 results) No results for input(s): BNP in the last 8760 hours.  ProBNP (last 3 results)  Recent Labs  01/17/14 2116  PROBNP 961.6*    CBG: No results for input(s): GLUCAP in the last 168 hours.  No results found for this or any previous visit (from the past 240 hour(s)).   Studies: Ct Abdomen Pelvis Wo Contrast  05/21/2014   CLINICAL DATA:  Nausea and vomiting for 3 days, abdominal pain and distension, history of multiple hernias, volvulus, cholecystectomy, diverticulitis, stroke, seizure, hyperlipidemia, small bowel obstruction, ventral hernia, chronic kidney disease  EXAM: CT ABDOMEN AND PELVIS WITHOUT CONTRAST  TECHNIQUE: Multidetector CT imaging of the abdomen and pelvis was performed following the standard protocol without IV contrast. Sagittal and coronal MPR images reconstructed from axial data set.  COMPARISON:  03/12/2014  FINDINGS: Minimal atelectasis at lung bases.  Small pericardial effusion.  Gallbladder surgically absent.  Multiple low-attenuation foci within liver similar to prior exams, some which are present small cyst, additional of which are indeterminate.  No new abnormalities of the  liver, spleen, atrophic pancreas, kidneys, or adrenal glands.  Marked gastric distention containing fluid and food debris.  Significant pyloric thickening again identified consistent with gastric outlet obstruction.  This could be due to peptic ulcer disease with associated mural edema or mass/tumor.  RIGHT parasagittal ventral hernia containing a nonobstructed bowel loop.  Large and small bowel loops otherwise unremarkable.  Extensive atherosclerotic disease.  No definite evidence of  bowel obstruction or perforation.  Unremarkable bladder, uterus and adnexae.  Bones demineralized with chronic superior endplate compression deformity of T11 unchanged.  IMPRESSION: Marked gastric distention with stomach filled with fluid and food debris and associated with thickening of the pyloric wall consistent with gastric outlet obstruction, question due to peptic ulcer disease with edema or mass/tumor.  Stable low to intermediate attenuation lesions within the liver.  Extensive atherosclerotic disease.   Electronically Signed   By: Ulyses Southward M.D.   On: 05/21/2014 20:43   Dg Abd Portable 1v  05/21/2014   CLINICAL DATA:  NG tube placement.  EXAM: PORTABLE ABDOMEN - 1 VIEW  COMPARISON:  None.  FINDINGS: NG tube is in place the side port above the gastroesophageal junction. Recommend advancement 7 cm.  IMPRESSION: As above.   Electronically Signed   By: Drusilla Kanner M.D.   On: 05/21/2014 22:30    Scheduled Meds: . amLODipine  10 mg Oral Daily  . antiseptic oral rinse  7 mL Mouth Rinse q12n4p  . aspirin EC  81 mg Oral q morning - 10a  . atorvastatin  10 mg Oral Daily  . budesonide-formoterol  2 puff Inhalation BID  . chlorhexidine  15 mL Mouth Rinse BID  . cloNIDine  0.3 mg Transdermal Weekly  . cycloSPORINE  1 drop Both Eyes BID  . gabapentin  300 mg Oral TID  . heparin  5,000 Units Subcutaneous 3 times per day  . hydrALAZINE  10 mg Intravenous 4 times per day  . isosorbide mononitrate  30 mg Oral Daily  . latanoprost  1 drop Both Eyes QHS  . levothyroxine  100 mcg Intravenous Daily  . lisinopril  10 mg Oral Daily  . metoprolol  5 mg Intravenous 4 times per day  . nitroGLYCERIN  0.2 mg Transdermal Daily  . pantoprazole (PROTONIX) IV  40 mg Intravenous Q12H  . sertraline  100 mg Oral q morning - 10a  . sodium chloride  1 g Oral TID WC   Continuous Infusions: . sodium chloride 75 mL/hr at 05/22/14 0112    Principal Problem:   Gastric outlet obstruction Active Problems:   DM  (diabetes mellitus) type II uncontrolled, periph vascular disorder   Hypothyroidism   Accelerated hypertension    Time spent: 35 minutes.     [name]  [facility] Pager [phone]. If 7PM-7AM, please contact night-coverage at [email or web address], password [facility] 05/22/2014, 8:10 AM  LOS: 1 day

## 2014-05-22 NOTE — Consult Note (Addendum)
Referring Provider: No ref. provider found Primary Care Physician:  Blanchie Serve, MD Primary Gastroenterologist:  Dr. Henrene Pastor  Reason for Consultation:  Recurrent GOO  HPI: Shannon [name] is a 81 y.o. female with history of GOO who presented to [facility] ED last night with complaints of abdominal pain, distention, and nausea/vomiting. NGT was placed and over 900 cc were suctioned from the patients stomach in under the first hour.  CT scan of the abdomen and pelvis revealed the following:  "IMPRESSION: Marked gastric distention with stomach filled with fluid and food debris and associated with thickening of the pyloric wall consistentwith gastric outlet obstruction, question due to peptic ulcer disease with edema or mass/tumor.  Stable low to intermediate attenuation lesions within the liver.  Extensive atherosclerotic disease."  Patient has somewhat complicated past medical history with remote gastro-jejunostomy which likely was done for peptic ulcer disease. She unfortunately had a perforation of an anastomotic ulcer at the gastro-jejunal anastomosis in June 2015 and underwent surgery at [facility]. She was cared for by a Dr. Ellie Lunch. She underwent resection of the anastomosis and revision with a jejunal-jejunal anastomosis. She also had repair of an incisional hernia done at that same time.  Patient was hospitalized here in October 2015 with abdominal pain, nausea, and vomiting and had endoscopy done twice during that admission with finding of a gastric bezoar. The initial endoscopy done on 10/19 with no evidence for anatomic outlet obstruction and a normal-appearing pylorus and duodenum. Repeat EGD was done 24 hours later with attempt to break up the bezoar which was soft. It was felt that this would be able to pass through her stomach. She was discharged home on a pured diet and PPI therapy.  She did well for a week or so then began having recurrent abdominal pain, early satiety,  frequent sour belching, and regurgitation.  She was seen in consult by our practice again in 03/2014 and CT scan showed persistent marked thickening of the pyloric channel and prepyloric antrum and a large filling defect in the stomach measuring 12 x 10 x 15 cm consistent with a bezoar. She had barium swallow today whichshowed a probable small paraesophageal hernia, the barium did trickle into the duodenum and there was no evidence of extravasation.  Last EGD by Dr. Ardis Hughs on 03/19/2014 revealed the following "There was a large amount of thin fluid in her stomach, approximately 500cc. This was suctioned out with endoscope. The distal gastric anatomy (prepylorus, pylorus) was very tortuous, narrowed without clear strictures or ulcers. I was able to advance the adult gastroscope through this region with only minimal pressure. The mucosa is normal through the tortuous region. The duodenal bulb and more distal duodenum appeared normal."  Recommendations were that her distorted distal gastric anatomy was causing at least partial gastric outlet obstruction. Treatment options include surgical revision, endoscopic stenting (not usually done for benign disease, however).  She was placed on Reglan and was told to remain on a full liquid diet only.  Was doing well with that when she was last seen by [place] in December before that hospital discharge.  She also had an UGI/SBFT on 12/23 that was normal.  Once again, she is back now with the same issues.  She says that she has been on a pureed diet mostly, but has eaten some regular food on occasion as well.  I do not see Reglan on her medication list, and actually do not see that she was even discharged home with it  back in December when we made the recommendations for her to continue it.  Feels much better since NGT was placed, although NGT is uncomfortable.     Past Medical History  Diagnosis Date  . Pneumonia   . Thyroid disease   . Seizures   . Stroke     . Hyperlipidemia   . Hypertension   . Diabetes mellitus without complication   . Small bowel obstruction   . Ventral hernia with bowel obstruction     Past Surgical History  Procedure Laterality Date  . Exploratory laparotomy  09-07-2013  . Cholecystectomy    . Tubal ligation    . Colectomy    . Hernia repair  2015  . Esophagogastroduodenoscopy N/A 01/19/2014    Procedure: ESOPHAGOGASTRODUODENOSCOPY (EGD);  Surgeon: Irene Shipper, MD;  Location: [facility];  Service: Endoscopy;  Laterality: N/A;  . Esophagogastroduodenoscopy N/A 01/20/2014    Procedure: ESOPHAGOGASTRODUODENOSCOPY (EGD);  Surgeon: Irene Shipper, MD;  Location: [facility];  Service: Endoscopy;  Laterality: N/A;  . Esophagogastroduodenoscopy N/A 03/19/2014    Procedure: ESOPHAGOGASTRODUODENOSCOPY (EGD);  Surgeon: Milus Banister, MD;  Location: [facility];  Service: Endoscopy;  Laterality: N/A;    Prior to Admission medications   Medication Sig Start Date End Date Taking? Authorizing Provider  acetaminophen (TYLENOL) 325 MG tablet Take 650 mg by mouth every 6 (six) hours as needed for mild pain or fever.   Yes Historical Provider, MD  albuterol (PROVENTIL HFA;VENTOLIN HFA) 108 (90 BASE) MCG/ACT inhaler Inhale 2 puffs into the lungs every 6 (six) hours as needed for wheezing or shortness of breath. 04/08/14  Yes Dorothy Spark, MD  amLODipine (NORVASC) 10 MG tablet Take 10 mg by mouth daily. For HTN   Yes Historical Provider, MD  aspirin EC 81 MG tablet Take 81 mg by mouth every morning.   Yes Historical Provider, MD  atorvastatin (LIPITOR) 10 MG tablet TAKE 1 TABLET BY MOUTH EVERY DAY 05/18/14  Yes Mahima Bubba Camp, MD  calcium-vitamin D (OSCAL WITH D) 500-200 MG-UNIT per tablet Take 1 tablet by mouth daily with breakfast.    Yes Historical Provider, MD  chlorhexidine (PERIDEX) 0.12 % solution 15 mLs by Mouth Rinse route 2 (two) times daily. 03/26/14  Yes Robbie Lis, MD  cloNIDine (CATAPRES - DOSED IN MG/24 HR) 0.3  mg/24hr patch Place 0.3 mg onto the skin once a week.   Yes Historical Provider, MD  cycloSPORINE (RESTASIS) 0.05 % ophthalmic emulsion Place 1 drop into both eyes 2 (two) times daily.   Yes Historical Provider, MD  fluticasone (FLONASE) 50 MCG/ACT nasal spray Place 2 sprays into both nostrils daily.   Yes Historical Provider, MD  gabapentin (NEURONTIN) 300 MG capsule TAKE ONE CAPSULE BY MOUTH 3 TIMES A DAY 05/13/14  Yes Mahima Pandey, MD  hydrALAZINE (APRESOLINE) 100 MG tablet TAKE 1 TABLET BY MOUTH 3 TIMES A DAY 05/18/14  Yes Mahima Pandey, MD  isosorbide mononitrate (IMDUR) 30 MG 24 hr tablet Take 1 tablet (30 mg total) by mouth daily. 04/08/14  Yes Dorothy Spark, MD  l-methylfolate-B6-B12 (METANX) 3-35-2 MG TABS Take 1 tablet by mouth every morning.   Yes Historical Provider, MD  latanoprost (XALATAN) 0.005 % ophthalmic solution Place 1 drop into the right eye at bedtime. Patient taking differently: Place 1 drop into both eyes at bedtime.  03/26/14  Yes Robbie Lis, MD  levothyroxine (SYNTHROID, LEVOTHROID) 200 MCG tablet TAKE 1 TABLET BY MOUTH EVERY DAY 05/13/14  Yes Blanchie Serve, MD  lisinopril (PRINIVIL,ZESTRIL) 10 MG tablet TAKE 1 TABLET BY MOUTH EVERY DAY 05/13/14  Yes Mahima Pandey, MD  metoprolol succinate (TOPROL-XL) 25 MG 24 hr tablet Take 25 mg by mouth daily.   Yes Historical Provider, MD  Multiple Vitamins-Minerals (MULTIVITAMIN WITH MINERALS) tablet Take 1 tablet by mouth every morning.    Yes Historical Provider, MD  nitroGLYCERIN (NITRODUR - DOSED IN MG/24 HR) 0.2 mg/hr patch APPLY ONE PATCH EVERY AM AND REMOVE AT BEDTIME 05/18/14  Yes Mahima Pandey, MD  pantoprazole (PROTONIX) 40 MG tablet Take 1 tablet (40 mg total) by mouth 2 (two) times daily. 03/26/14  Yes Robbie Lis, MD  polyethylene glycol [facility] / Floria Raveling) packet Take 17 g by mouth daily.   Yes Historical Provider, MD  polyvinyl alcohol (LIQUIFILM TEARS) 1.4 % ophthalmic solution Place 1 drop into both eyes as needed  for dry eyes. 03/26/14  Yes Robbie Lis, MD  sennosides-docusate sodium (SENOKOT-S) 8.6-50 MG tablet Take 1 tablet by mouth 2 (two) times daily.    Yes Historical Provider, MD  sertraline (ZOLOFT) 100 MG tablet Take 100 mg by mouth every morning. For depression   Yes Historical Provider, MD  sodium chloride 1 G tablet Take 1 tablet (1 g total) by mouth 3 (three) times daily with meals. 03/31/14  Yes Velvet Bathe, MD  sucralfate (CARAFATE) 1 G tablet TAKE 1 TABLET BY MOUTH 3 TIMES A DAY WITH MEALS 05/18/14  Yes Blanchie Serve, MD  SYMBICORT 160-4.5 MCG/ACT inhaler INHALE 2 PUFFS TWICE A DAY 05/18/14  Yes Mahima Pandey, MD  traMADol (ULTRAM) 50 MG tablet Take by mouth 2 (two) times daily as needed for moderate pain.   Yes Historical Provider, MD    Current Facility-Administered Medications  Medication Dose Route Frequency Provider Last Rate Last Dose  . 0.9 %  sodium chloride infusion   Intravenous Continuous Etta Quill, DO 75 mL/hr at 05/22/14 0112    . acetaminophen (TYLENOL) suppository 650 mg  650 mg Rectal Q6H PRN [name], RPH      . albuterol (PROVENTIL) (2.5 MG/3ML) 0.083% nebulizer solution 2.5 mg  2.5 mg Nebulization Q6H PRN Etta Quill, DO      . antiseptic oral rinse (CPC / CETYLPYRIDINIUM CHLORIDE 0.05%) solution 7 mL  7 mL Mouth Rinse q12n4p Etta Quill, DO      . budesonide-formoterol (SYMBICORT) 160-4.5 MCG/ACT inhaler 2 puff  2 puff Inhalation BID Etta Quill, DO      . chlorhexidine (PERIDEX) 0.12 % solution 15 mL  15 mL Mouth Rinse BID Etta Quill, DO   15 mL at 05/22/14 0831  . cloNIDine (CATAPRES - Dosed in mg/24 hr) patch 0.3 mg  0.3 mg Transdermal Weekly Etta Quill, DO      . cycloSPORINE (RESTASIS) 0.05 % ophthalmic emulsion 1 drop  1 drop Both Eyes BID Etta Quill, DO      . gabapentin (NEURONTIN) capsule 300 mg  300 mg Oral TID Belkys A Regalado, MD      . heparin injection 5,000 Units  5,000 Units Subcutaneous 3 times per day Etta Quill, DO   5,000 Units at 05/22/14 0549  . hydrALAZINE (APRESOLINE) injection 10 mg  10 mg Intravenous 4 times per day Elmarie Shiley, MD   10 mg at 05/22/14 0553  . latanoprost (XALATAN) 0.005 % ophthalmic solution 1 drop  1 drop Both Eyes QHS Etta Quill, DO      . levothyroxine (SYNTHROID,  LEVOTHROID) injection 100 mcg  100 mcg Intravenous Daily Etta Quill, DO      . metoprolol (LOPRESSOR) injection 5 mg  5 mg Intravenous 4 times per day [name], RPH   5 mg at 05/22/14 0545  . nitroGLYCERIN (NITRODUR - Dosed in mg/24 hr) patch 0.2 mg  0.2 mg Transdermal Daily Etta Quill, DO      . pantoprazole (PROTONIX) injection 40 mg  40 mg Intravenous Q12H Etta Quill, DO      . polyvinyl alcohol (LIQUIFILM TEARS) 1.4 % ophthalmic solution 1 drop  1 drop Both Eyes PRN Etta Quill, DO      . sertraline (ZOLOFT) tablet 100 mg  100 mg Oral q morning - 10a Etta Quill, DO        Allergies as of 05/21/2014  . (No Known Allergies)    Family History  Problem Relation Age of Onset  . Stroke Mother   . Heart attack Neg Hx   . Hypertension Mother   . Diabetes Brother     History   Social History  . Marital Status: Married    Spouse Name: N/A  . Number of Children: N/A  . Years of Education: N/A   Occupational History  . Not on file.   Social History Main Topics  . Smoking status: Never Smoker   . Smokeless tobacco: Not on file  . Alcohol Use: No  . Drug Use: No  . Sexual Activity: Not on file   Other Topics Concern  . Not on file   Social History Narrative   Married in [place], lives in one story house with 2 people and no pets   Occupation: ?   Has a living Will, [place], and doesn't have DNR   Was living with Husband (in frail health) in [place] until 09/2013.  After her [facility] admission they both came to live with daughter in [place]: Ten point ROS is O/W negative except as mentioned in HPI.  Physical Exam: Vital signs in  last 24 hours: Temp:  [98.1 F (36.7 C)-98.9 F (37.2 C)] 98.1 F (36.7 C) (02/19 0546) Pulse Rate:  [58-95] 75 (02/19 0546) Resp:  [11-23] 16 (02/19 0546) BP: (132-185)/(64-96) 145/69 mmHg (02/19 0546) SpO2:  [94 %-100 %] 96 % (02/19 0546) Weight:  [133 lb 11.2 oz (60.646 kg)] 133 lb 11.2 oz (60.646 kg) (02/19 0104) Last BM Date: 05/19/14 General:  Alert, elderly and frail, pleasant and cooperative in NAD Head:  Normocephalic and atraumatic. Eyes:  Sclera clear, no icterus.  Conjunctiva pink. Ears:  Normal auditory acuity. Nose:  NGT in place with thick tan colored material in tube and canister. Mouth:  No deformity or lesions.  Poor dentition.   Lungs:  Clear throughout to auscultation.  No wheezes, crackles, or rhonchi.  Heart:  Regular rate and rhythm.   Abdomen:  Soft, non-distended.  BS present, but quiet.  Large laparotomy scar noted on abdomen.  Some left sided TTP.  Rectal:  Deferred  Msk:  Symmetrical without gross deformities. Pulses:  Normal pulses noted. Extremities:  Without clubbing or edema. Neurologic:  Alert and  oriented x4;  grossly normal neurologically. Skin:  Intact without significant lesions or rashes. Psych:  Alert and cooperative. Normal mood and affect.  Intake/Output from previous day: 02/18 0701 - 02/19 0700 In: 343.8 [I.V.:343.8] Out: 1450 [Emesis/NG output:200]  Lab Results:  Recent Labs  05/21/14 1636 05/22/14 0550  WBC 5.8  6.2  HGB 10.8* 10.3*  HCT 31.0* 29.3*  PLT 287 269   BMET  Recent Labs  05/21/14 1636 05/22/14 0550  NA 130* 131*  K 3.9 3.9  CL 95* 98  CO2 26 29  GLUCOSE 110* 112*  BUN 21 19  CREATININE 1.62* 1.37*  CALCIUM 11.0* 9.9   LFT  Recent Labs  05/21/14 1636  PROT 7.7  ALBUMIN 4.2  AST 39*  ALT 24  ALKPHOS 73  BILITOT 0.9   Studies/Results: Ct Abdomen Pelvis Wo Contrast  05/21/2014   CLINICAL DATA:  Nausea and vomiting for 3 days, abdominal pain and distension, history of multiple hernias,  volvulus, cholecystectomy, diverticulitis, stroke, seizure, hyperlipidemia, small bowel obstruction, ventral hernia, chronic kidney disease  EXAM: CT ABDOMEN AND PELVIS WITHOUT CONTRAST  TECHNIQUE: Multidetector CT imaging of the abdomen and pelvis was performed following the standard protocol without IV contrast. Sagittal and coronal MPR images reconstructed from axial data set.  COMPARISON:  03/12/2014  FINDINGS: Minimal atelectasis at lung bases.  Small pericardial effusion.  Gallbladder surgically absent.  Multiple low-attenuation foci within liver similar to prior exams, some which are present small cyst, additional of which are indeterminate.  No new abnormalities of the liver, spleen, atrophic pancreas, kidneys, or adrenal glands.  Marked gastric distention containing fluid and food debris.  Significant pyloric thickening again identified consistent with gastric outlet obstruction.  This could be due to peptic ulcer disease with associated mural edema or mass/tumor.  RIGHT parasagittal ventral hernia containing a nonobstructed bowel loop.  Large and small bowel loops otherwise unremarkable.  Extensive atherosclerotic disease.  No definite evidence of bowel obstruction or perforation.  Unremarkable bladder, uterus and adnexae.  Bones demineralized with chronic superior endplate compression deformity of T11 unchanged.  IMPRESSION: Marked gastric distention with stomach filled with fluid and food debris and associated with thickening of the pyloric wall consistent with gastric outlet obstruction, question due to peptic ulcer disease with edema or mass/tumor.  Stable low to intermediate attenuation lesions within the liver.  Extensive atherosclerotic disease.   Electronically Signed   By: Lavonia Dana M.D.   On: 05/21/2014 20:43   Dg Abd Portable 1v  05/21/2014   CLINICAL DATA:  NG tube placement.  EXAM: PORTABLE ABDOMEN - 1 VIEW  COMPARISON:  None.  FINDINGS: NG tube is in place the side port above the  gastroesophageal junction. Recommend advancement 7 cm.  IMPRESSION: As above.   Electronically Signed   By: Inge Rise M.D.   On: 05/21/2014 22:30    IMPRESSION:  -81 y.o. female with incomplete, partial gastric outlet obstruction with tortuous distal stomach seen on EGD 12/17. This was the same problem she had many years ago that led to her original gastro-jejunostomy. Had a bezoar with pulverization in 01/2104.  UGI/SBFT was normal in 03/2104. Difficult situation. Pt and family do not want any further surgeries, which was discussed at hospitalization in December.  She had been receiving Reglan 10 mg TID via IV during hospitalization in December and we recommended that she go home on this PO as well, but I do not see that this was done despite our recommendations. We also recommended to not plan to advance her diet past full liquids, however, she has been on a pureed diet at home.  Other options that were discussed at her last hospitalization were consider venting PEG, perhaps PEG-J to allow nutritional support. Could consider duodenal stent, although that would be controversial in benign disease.  PLAN: -Continue  NGT for now. -Would consider restarting Reglan 10 mg IV TID. -Will discuss with Dr. Carlean Purl regarding any other long-term options for her.  ZEHR, JESSICA D.  05/22/2014, 9:25 AM  Pager number [phone]  [facility] Attending  I have also seen and assessed the patient and agree with the above note. Her stomach has blown up again but no bezoar apparent this time. Undoubtetly has had a vagotomy which contributes to her problems. This is a functional problem and will probably keep recurring I suspect. Will add Reglan. Irrigate NG tube since output is thick. KUB f/u Change IVF to D5NS w/ K - needs glucose not just NS I am not inclined to stent this based upon what I know at this point.   Will follow  Gatha Mayer, MD, [facility] [phone]  (pager) 05/22/2014 6:14 PM

## 2014-05-22 NOTE — Progress Notes (Signed)
INITIAL NUTRITION ASSESSMENT  DOCUMENTATION CODES Per approved criteria  -Severe malnutrition in the context of acute illness or injury   Pt meets criteria for severe MALNUTRITION in the context of acute illness as evidenced by <50% estimated energy intake x 5 days, 11.3% wt loss x 3 months.  INTERVENTION: -RD to follow for diet advancement -Supplement diet as appropriate  NUTRITION DIAGNOSIS: Inadequate oral intake related to altered GI function as evidenced by NPO.   Goal: Pt will meet >90% of estimated nutritional needs  Monitor:  Diet advancement, PO intake, labs, weight changes, I/O's  Reason for Assessment: MST=3  81 y.o. female  Admitting Dx: Gastric outlet obstruction  Shannon [name] is a 80 y.o. female with history of known gastric outlet obstruction probably related to a perforated pyloric / duodenal ulcer she had in the summer last year. Was most recently seen in December of last year for this problem and underwent upper EGD at that time, tortous outlet but mucosa normal, see Dr. Eugenia Pancoast EGD note. Patient is on a liquid diet only since that time; however, at times patient does want to eat some more normal food. On Monday she had some sausage in small pieces and on the next day had some pastry. Since then has had increasing abdominal pain and distention until finally she presents to the ED today with N/V.  ASSESSMENT: Pt admitted with gastric outlet obstruction.  Pt in with multiple providers at time of visit. Nutrition-focused physical exam deferred at this time.  Pt with NGT placed for decompression. Noted 200 ml output in the past 24 hours.  Chart review reveals that pt has been mainly on a liquid diet PTA since 03/2014, after having an EGD. Per H&P, pt experiences abdominal pain when consuming solid foods. Wt hx reveals wt fluctuations, but noted a 11.3% wt loss x 3 months, which is clinically significant.  Labs reviewed. Na: 131, Creat: 1.37, Glucose: 112.    Height: Ht Readings from Last 1 Encounters:  04/29/14 5\' 6"  (1.676 m)    Weight: Wt Readings from Last 1 Encounters:  05/22/14 133 lb 11.2 oz (60.646 kg)    Ideal Body Weight: 130#  % Ideal Body Weight: 102%  Wt Readings from Last 50 Encounters:  05/22/14 133 lb 11.2 oz (60.646 kg)  04/29/14 142 lb (64.411 kg)  04/16/14 138 lb 6.4 oz (62.778 kg)  04/10/14 138 lb (62.596 kg)  04/08/14 138 lb 12.8 oz (62.959 kg)  03/31/14 137 lb 12.8 oz (62.506 kg)  03/27/14 147 lb 11.3 oz (67 kg)  03/26/14 136 lb 14.5 oz (62.1 kg)  03/12/14 132 lb (59.875 kg)  02/24/14 150 lb (68.04 kg)  02/18/14 152 lb (68.947 kg)  02/03/14 152 lb (68.947 kg)  01/21/14 159 lb 9.8 oz (72.4 kg)  01/07/14 155 lb (70.308 kg)  12/25/13 149 lb 4.8 oz (67.722 kg)  11/25/13 151 lb (68.493 kg)  10/15/13 152 lb 4.8 oz (69.083 kg)  10/01/13 151 lb 6 oz (68.663 kg)  09/22/13 151 lb 6.4 oz (68.675 kg)   Usual Body Weight: 150#  % Usual Body Weight: 89%  BMI:  Body mass index is 21.59 kg/(m^2). Normal weight range  Estimated Nutritional Needs: Kcal: 1600-1800 Protein: 66-76 grams Fluid: 1.6-18 L  Skin: WDL  Diet Order: Diet NPO time specified  EDUCATION NEEDS: -Education not appropriate at this time   Intake/Output Summary (Last 24 hours) at 05/22/14 1133 Last data filed at 05/22/14 1007  Gross per 24 hour  Intake 343.75 ml  Output   1450 ml  Net -1106.25 ml    Last BM: 05/19/14  Labs:   Recent Labs Lab 05/21/14 1636 05/22/14 0550  NA 130* 131*  K 3.9 3.9  CL 95* 98  CO2 26 29  BUN 21 19  CREATININE 1.62* 1.37*  CALCIUM 11.0* 9.9  GLUCOSE 110* 112*    CBG (last 3)  No results for input(s): GLUCAP in the last 72 hours.  Scheduled Meds: . antiseptic oral rinse  7 mL Mouth Rinse q12n4p  . budesonide-formoterol  2 puff Inhalation BID  . chlorhexidine  15 mL Mouth Rinse BID  . cloNIDine  0.3 mg Transdermal Weekly  . cycloSPORINE  1 drop Both Eyes BID  . gabapentin  300 mg Oral  TID  . heparin  5,000 Units Subcutaneous 3 times per day  . hydrALAZINE  10 mg Intravenous 4 times per day  . latanoprost  1 drop Both Eyes QHS  . levothyroxine  100 mcg Intravenous Daily  . metoprolol  5 mg Intravenous 4 times per day  . nitroGLYCERIN  0.2 mg Transdermal Daily  . pantoprazole (PROTONIX) IV  40 mg Intravenous Q12H  . sertraline  100 mg Oral q morning - 10a    Continuous Infusions: . sodium chloride 75 mL/hr at 05/22/14 0112    Past Medical History  Diagnosis Date  . Pneumonia   . Thyroid disease   . Seizures   . Stroke   . Hyperlipidemia   . Hypertension   . Diabetes mellitus without complication   . Small bowel obstruction   . Ventral hernia with bowel obstruction     Past Surgical History  Procedure Laterality Date  . Exploratory laparotomy  09-07-2013  . Cholecystectomy    . Tubal ligation    . Colectomy    . Hernia repair  2015  . Esophagogastroduodenoscopy N/A 01/19/2014    Procedure: ESOPHAGOGASTRODUODENOSCOPY (EGD);  Surgeon: Irene Shipper, MD;  Location: [facility];  Service: Endoscopy;  Laterality: N/A;  . Esophagogastroduodenoscopy N/A 01/20/2014    Procedure: ESOPHAGOGASTRODUODENOSCOPY (EGD);  Surgeon: Irene Shipper, MD;  Location: [facility];  Service: Endoscopy;  Laterality: N/A;  . Esophagogastroduodenoscopy N/A 03/19/2014    Procedure: ESOPHAGOGASTRODUODENOSCOPY (EGD);  Surgeon: Milus Banister, MD;  Location: [facility];  Service: Endoscopy;  Laterality: N/A;   Shannon [name] A. Jimmye Norman, RD, LDN, CDE Pager: [phone] After hours Pager: [phone]

## 2014-05-22 NOTE — ED Notes (Signed)
Attempted report 

## 2014-05-22 NOTE — Plan of Care (Signed)
Problem: Phase I Progression Outcomes Goal: Pain controlled with appropriate interventions Outcome: Progressing No c/o pain and abdomen softer

## 2014-05-23 ENCOUNTER — Inpatient Hospital Stay (HOSPITAL_COMMUNITY): Payer: Medicare Other

## 2014-05-23 LAB — CBC
HCT: 26.7 % — ABNORMAL LOW (ref 36.0–46.0)
Hemoglobin: 9.4 g/dL — ABNORMAL LOW (ref 12.0–15.0)
MCH: 30.6 pg (ref 26.0–34.0)
MCHC: 35.2 g/dL (ref 30.0–36.0)
MCV: 87 fL (ref 78.0–100.0)
Platelets: 237 10*3/uL (ref 150–400)
RBC: 3.07 MIL/uL — ABNORMAL LOW (ref 3.87–5.11)
RDW: 16.8 % — ABNORMAL HIGH (ref 11.5–15.5)
WBC: 5 10*3/uL (ref 4.0–10.5)

## 2014-05-23 LAB — BASIC METABOLIC PANEL
Anion gap: 6 (ref 5–15)
BUN: 16 mg/dL (ref 6–23)
CO2: 22 mmol/L (ref 19–32)
Calcium: 9.3 mg/dL (ref 8.4–10.5)
Chloride: 105 mmol/L (ref 96–112)
Creatinine, Ser: 1.14 mg/dL — ABNORMAL HIGH (ref 0.50–1.10)
GFR calc Af Amer: 48 mL/min — ABNORMAL LOW (ref >90)
GFR calc non Af Amer: 42 mL/min — ABNORMAL LOW (ref >90)
Glucose, Bld: 157 mg/dL — ABNORMAL HIGH (ref 70–99)
Potassium: 4.2 mmol/L (ref 3.5–5.1)
Sodium: 133 mmol/L — ABNORMAL LOW (ref 135–145)

## 2014-05-23 MED ORDER — WHITE PETROLATUM GEL
Status: AC
  Filled 2014-05-23: qty 1

## 2014-05-23 NOTE — Progress Notes (Addendum)
   Patient Name: Shannon [name] Date of Encounter: 05/23/2014, 8:56 AM    Subjective  " My stomach is less swollen but it still hurts when you press on it"   Objective  BP 125/53 mmHg  Pulse 60  Temp(Src) 98.6 F (37 C) (Oral)  Resp 16  Wt 133 lb 11.2 oz (60.646 kg)  SpO2 95% Awake and alert NGT in - draining thick secretions abd is soft and minimally tender and less distended Eyes are anicteric  KUB - NG tube advanced, less material in stomach (images viewed)  I/O last 3 completed shifts: In: 2233.8 [I.V.:2173.8; NG/GT:60] Out: 2650 [Urine:750; Emesis/NG output:650; Other:1250]       Assessment and Plan  1) Gastric dysmotility 2) Gastric outlet obstruction - functional and not mechanical we think 3) Prior gastric surgeries - suspect prior vagotomy   Will continue NG suction I added back Reglan yesterday Note on chart "watch for serotonin syndrome" - I see that - feel we need to use the metaclopramide  I spoke to daughter and POA Soledad Gerlach today  1) Reglan was not Rxed due to possible side effects 2) Says they were told she could eat anything so they went oureed - knew that could not have regular food 3) Patient against a PEG 4) "we were for the stents"  I explained this is not an easy problem to solve Plan for continued Reglan in hospital and I am in favor of trying that as outpt (would use liquid) as side effects are possible and not guaranteed and not usu life threatening Stenting this area is of uncertain value but is a consideration - will discuss with partners including Dr. Ardis Hughs who scoped her last and suggested this. I. did explain stents could migrate and cause worsening problems that could be life threatening w/o surgery.  We may need to discontinue sertraline as opposed to not using Reglan  Also I think goals of care - palliative care consult could be useful  Gatha Mayer, MD, [facility] [phone] (pager) 05/23/2014  8:56 AM

## 2014-05-23 NOTE — Progress Notes (Signed)
[facility] PROGRESS NOTE  Shannon [name] [id number]:[id number] DOB: [birth year] DOA: 05/21/2014 PCP: Oneal Grout, MD  Assessment/Plan: 1-Gastric Outlet Obstruction:  NG tube in place.  NPO.  GI following.  IV fluids. IV protonix. IV Reglan.  KUB pending.   2-Hypothyroidism: Synthroid IV.   3-DM; does not appear to be on any home meds for this at baseline  4-HTN; clonidine patch. IV metoprolol. Hold lisinopril, Imdur, Norvasc while NPO. IV hydralazine. Holder parameter for BP.   5-DVT prophylaxis: heparin.   6-Hyponatremia; continue with IV fluids. Hold salt tablet. Stable.   7-History of seizure; not on medications. Patient relates she was taking off medications for seizure while back. She is on gabapentin for neuropathy.  8-Anemia; follow trend.  9-AKI; improving on IV fluids. Cr peak to 1.6. Cr has decrease to 1.1.  Code Status: DNR Family Communication: Care discussed with patient.  Disposition Plan: Remain inpatient,.    Consultants:  GI.   Procedures:  none  Antibiotics:  none  HPI/Subjective: Feels ok, denies abdominal pain, only when we press her abdomen.  No passing gas, had small BM yesterday.   Objective: Filed Vitals:   05/23/14 0557  BP: 125/53  Pulse: 60  Temp: 98.6 F (37 C)  Resp:     Intake/Output Summary (Last 24 hours) at 05/23/14 0805 Last data filed at 05/23/14 8413  Gross per 24 hour  Intake   1890 ml  Output   1200 ml  Net    690 ml   Filed Weights   05/22/14 0104  Weight: 60.646 kg (133 lb 11.2 oz)    Exam:   General:  Alert in no distress, NGT in place.   Cardiovascular: S 1, S 2 RRR  Respiratory: CTA  Abdomen: BS, abdomen less  distended, tenderness, no rigidity  Musculoskeletal: no edema.   Data Reviewed: Basic Metabolic Panel:  Recent Labs Lab 05/21/14 1636 05/22/14 0550 05/23/14 0440  NA 130* 131* 133*  K 3.9 3.9 4.2  CL 95* 98 105  CO2 26 29 22   GLUCOSE 110* 112* 157*  BUN 21 19 16   CREATININE  1.62* 1.37* 1.14*  CALCIUM 11.0* 9.9 9.3   Liver Function Tests:  Recent Labs Lab 05/21/14 1636  AST 39*  ALT 24  ALKPHOS 73  BILITOT 0.9  PROT 7.7  ALBUMIN 4.2    Recent Labs Lab 05/21/14 1636  LIPASE 38   No results for input(s): AMMONIA in the last 168 hours. CBC:  Recent Labs Lab 05/21/14 1636 05/22/14 0550 05/23/14 0440  WBC 5.8 6.2 5.0  NEUTROABS 4.4  --   --   HGB 10.8* 10.3* 9.4*  HCT 31.0* 29.3* 26.7*  MCV 86.1 86.4 87.0  PLT 287 269 237   Cardiac Enzymes: No results for input(s): CKTOTAL, CKMB, CKMBINDEX, TROPONINI in the last 168 hours. BNP (last 3 results) No results for input(s): BNP in the last 8760 hours.  ProBNP (last 3 results)  Recent Labs  01/17/14 2116  PROBNP 961.6*    CBG: No results for input(s): GLUCAP in the last 168 hours.  No results found for this or any previous visit (from the past 240 hour(s)).   Studies: Ct Abdomen Pelvis Wo Contrast  05/21/2014   CLINICAL DATA:  Nausea and vomiting for 3 days, abdominal pain and distension, history of multiple hernias, volvulus, cholecystectomy, diverticulitis, stroke, seizure, hyperlipidemia, small bowel obstruction, ventral hernia, chronic kidney disease  EXAM: CT ABDOMEN AND PELVIS WITHOUT CONTRAST  TECHNIQUE: Multidetector CT imaging of the abdomen  and pelvis was performed following the standard protocol without IV contrast. Sagittal and coronal MPR images reconstructed from axial data set.  COMPARISON:  03/12/2014  FINDINGS: Minimal atelectasis at lung bases.  Small pericardial effusion.  Gallbladder surgically absent.  Multiple low-attenuation foci within liver similar to prior exams, some which are present small cyst, additional of which are indeterminate.  No new abnormalities of the liver, spleen, atrophic pancreas, kidneys, or adrenal glands.  Marked gastric distention containing fluid and food debris.  Significant pyloric thickening again identified consistent with gastric outlet  obstruction.  This could be due to peptic ulcer disease with associated mural edema or mass/tumor.  RIGHT parasagittal ventral hernia containing a nonobstructed bowel loop.  Large and small bowel loops otherwise unremarkable.  Extensive atherosclerotic disease.  No definite evidence of bowel obstruction or perforation.  Unremarkable bladder, uterus and adnexae.  Bones demineralized with chronic superior endplate compression deformity of T11 unchanged.  IMPRESSION: Marked gastric distention with stomach filled with fluid and food debris and associated with thickening of the pyloric wall consistent with gastric outlet obstruction, question due to peptic ulcer disease with edema or mass/tumor.  Stable low to intermediate attenuation lesions within the liver.  Extensive atherosclerotic disease.   Electronically Signed   By: Ulyses Southward M.D.   On: 05/21/2014 20:43   Dg Abd 1 View  05/23/2014   CLINICAL DATA:  81 year old female with a history of small bowel obstruction. Not currently having pain.  EXAM: ABDOMEN - 1 VIEW  COMPARISON:  Plain film 05/21/2014, CT 05/21/2014  FINDINGS: Interval advancement of the gastric tube, which terminates in the left abdomen.  No abnormally distended small bowel or colon. Gas extends to the rectum/sigmoid colon. No unexpected calcifications or radiopaque foreign body.  Vascular calcifications.  Surgical staple line within the left abdomen.  Surgical clips at the inferior liver margin.  IMPRESSION: Interval advancement of the gastric tube, which now terminates in the left abdomen.  Decreased volume of the soft tissue in the mid abdomen, likely after decompression of the stomach which was distended on the most recent CT.  Gas extends through the colon to the rectum/sigmoid colon with no evidence of small bowel or colonic obstruction. Note that the recent CT demonstrated a more proximal gastric outlet obstruction.  Surgical clips at the inferior liver margin and staple line in the left  abdomen.  Signed,  [name]. [name], DO  [facility]  [facility]   Electronically Signed   By: [name]   On: 05/23/2014 07:57   Dg Abd Portable 1v  05/21/2014   CLINICAL DATA:  NG tube placement.  EXAM: PORTABLE ABDOMEN - 1 VIEW  COMPARISON:  None.  FINDINGS: NG tube is in place the side port above the gastroesophageal junction. Recommend advancement 7 cm.  IMPRESSION: As above.   Electronically Signed   By: Drusilla Kanner M.D.   On: 05/21/2014 22:30    Scheduled Meds: . antiseptic oral rinse  7 mL Mouth Rinse q12n4p  . budesonide-formoterol  2 puff Inhalation BID  . chlorhexidine  15 mL Mouth Rinse BID  . cloNIDine  0.3 mg Transdermal Weekly  . cycloSPORINE  1 drop Both Eyes BID  . gabapentin  300 mg Oral TID  . heparin  5,000 Units Subcutaneous 3 times per day  . hydrALAZINE  10 mg Intravenous 4 times per day  . latanoprost  1 drop Both Eyes QHS  . levothyroxine  100 mcg Intravenous Daily  .  metoCLOPramide (REGLAN) injection  10 mg Intravenous 4 times per day  . metoprolol  5 mg Intravenous 4 times per day  . nitroGLYCERIN  0.2 mg Transdermal Daily  . pantoprazole (PROTONIX) IV  40 mg Intravenous Q12H  . sertraline  100 mg Oral q morning - 10a   Continuous Infusions: . dextrose 5 % and 0.9 % NaCl with KCl 20 mEq/L 75 mL/hr at 05/22/14 1953    Principal Problem:   Gastric outlet obstruction Active Problems:   DM (diabetes mellitus) type II uncontrolled, periph vascular disorder   Hypothyroidism   Accelerated hypertension    Time spent: 35 minutes.     [name]  [facility] Pager [phone]. If 7PM-7AM, please contact night-coverage at [email or web address], password [facility] 05/23/2014, 8:05 AM  LOS: 2 days

## 2014-05-23 NOTE — Progress Notes (Signed)
PATIENT RESTING IN BED. DENIES PAIN OR DISCOMFORT, BUT C/O BEING COLD. ROOM TEMPERATURE INCREASED AND EXTRA BLANKETS PROVIDED AS REQUESTED. PATIENT INSTRUCTED TO CALL FOR ASSISTANCE WHEN NEEDED.

## 2014-05-23 NOTE — Plan of Care (Signed)
Problem: Phase I Progression Outcomes Goal: Pain controlled with appropriate interventions Outcome: Completed/Met Date Met:  05/23/14 No c/o pain

## 2014-05-23 NOTE — Plan of Care (Signed)
Problem: Phase I Progression Outcomes Goal: OOB as tolerated unless otherwise ordered Outcome: Progressing Up in chair most of day

## 2014-05-24 LAB — BASIC METABOLIC PANEL
Anion gap: 4 — ABNORMAL LOW (ref 5–15)
BUN: 9 mg/dL (ref 6–23)
CO2: 24 mmol/L (ref 19–32)
Calcium: 9.9 mg/dL (ref 8.4–10.5)
Chloride: 105 mmol/L (ref 96–112)
Creatinine, Ser: 1.07 mg/dL (ref 0.50–1.10)
GFR calc Af Amer: 52 mL/min — ABNORMAL LOW (ref >90)
GFR calc non Af Amer: 45 mL/min — ABNORMAL LOW (ref >90)
Glucose, Bld: 173 mg/dL — ABNORMAL HIGH (ref 70–99)
Potassium: 4.6 mmol/L (ref 3.5–5.1)
Sodium: 133 mmol/L — ABNORMAL LOW (ref 135–145)

## 2014-05-24 NOTE — Plan of Care (Signed)
Problem: Phase I Progression Outcomes Goal: Hemodynamically stable Outcome: Not Progressing Blood pressure went to 206/74 this evening- required Metoprolol

## 2014-05-24 NOTE — Progress Notes (Signed)
         Patient Name: Shannon [name] Date of Encounter: 05/24/2014, 10:32 AM    Subjective  Had a BM since yesterday Slight epigastric pain Feels ok otherwise   Objective  BP 180/64 mmHg  Pulse 75  Temp(Src) 99.3 F (37.4 C) (Oral)  Resp 15  Wt 133 lb 11.2 oz (60.646 kg)  SpO2 98% NAD NG tube in place thick secretions abd is soft, BS + and slightly tender below xiphoid She is alert and oriented   I/O last 3 completed shifts: In: 2220 [I.V.:2130; NG/GT:90] Out: 10 [Urine:850; Emesis/NG output:200]   Lab Results  Component Value Date   CREATININE 1.14* 05/23/2014   BUN 16 05/23/2014   NA 133* 05/23/2014   K 4.2 05/23/2014   CL 105 05/23/2014   CO2 22 05/23/2014     Assessment and Plan  1) Gastric dysmotility 2) Gastric outlet obstruction - functional and not mechanical we think 3) Prior gastric surgeries - suspect prior vagotomy   Looks improved Will clamp NG tube and try liquids Discussed palliative care consult with patient and daughter - will hold off Daughter getting impression palliative care means we will not treat despite my explanation to contrary I did explain that her mom is elderly and has a problem that may not be fixable and palliative care input can help maximize quality of life in such a situation Daughter still interested in stenting - I explained we should try Reglan and liquid diet correctly in my opinion but that we will discuss as a GI group. Daughter easy to talk to but I get the sense she may not realize stenting was discussed as a possibility and not at all certain it will work or make things worse possibly.  Gatha Mayer, MD, [facility] [phone] (pager) 05/24/2014 10:39 AM

## 2014-05-24 NOTE — Progress Notes (Addendum)
[facility] PROGRESS NOTE  Shannon [name] WUJ:[id number] DOB: Mar 21, 1924 DOA: 05/21/2014 PCP: Oneal Grout, MD  Assessment/Plan: 1-Gastric Outlet Obstruction:  NG tube in place.  Clear diet. Nutritionist consulted.  GI following.  IV fluids. IV protonix. IV Reglan.  KUB no bowel obstruction.   2-Hypothyroidism: Synthroid IV. Change to oral when NGT remove and patient tolerating diet.   3-DM; does not appear to be on any home meds for this at baseline  4-HTN; clonidine patch. IV metoprolol. Hold lisinopril, Imdur, Norvasc while NPO. IV hydralazine. Holder parameter for BP.   5-DVT prophylaxis: heparin.   6-Hyponatremia; continue with IV fluids. Hold salt tablet. Stable.   7-History of seizure; not on medications. Patient relates she was taking off medications for seizure while back. She is on gabapentin for neuropathy.   8-Anemia; follow trend. Hb stable  9-AKI; improving on IV fluids. Cr peak to 1.6. Cr has decrease to 1.1.  10-Severe Malnutrition: acute on chronic illness. Nutrition consulted,.   Code Status: DNR Family Communication: Care discussed with patient.  Disposition Plan: Remain inpatient,.    Consultants:  GI.   Procedures:  none  Antibiotics:  none  HPI/Subjective: Feeling ok, tolerating clears. Denies abdominal pain.   Objective: Filed Vitals:   05/24/14 0609  BP: 180/64  Pulse: 75  Temp: 99.3 F (37.4 C)  Resp:     Intake/Output Summary (Last 24 hours) at 05/24/14 0816 Last data filed at 05/24/14 9562  Gross per 24 hour  Intake    330 ml  Output    550 ml  Net   -220 ml   Filed Weights   05/22/14 0104  Weight: 60.646 kg (133 lb 11.2 oz)    Exam:   General:  Alert in no distress, NGT in place.   Cardiovascular: S 1, S 2 RRR  Respiratory: CTA  Abdomen: BS, abdomen less  distended, tenderness, no rigidity  Musculoskeletal: no edema.   Data Reviewed: Basic Metabolic Panel:  Recent Labs Lab 05/21/14 1636  05/22/14 0550 05/23/14 0440  NA 130* 131* 133*  K 3.9 3.9 4.2  CL 95* 98 105  CO2 26 29 22   GLUCOSE 110* 112* 157*  BUN 21 19 16   CREATININE 1.62* 1.37* 1.14*  CALCIUM 11.0* 9.9 9.3   Liver Function Tests:  Recent Labs Lab 05/21/14 1636  AST 39*  ALT 24  ALKPHOS 73  BILITOT 0.9  PROT 7.7  ALBUMIN 4.2    Recent Labs Lab 05/21/14 1636  LIPASE 38   No results for input(s): AMMONIA in the last 168 hours. CBC:  Recent Labs Lab 05/21/14 1636 05/22/14 0550 05/23/14 0440  WBC 5.8 6.2 5.0  NEUTROABS 4.4  --   --   HGB 10.8* 10.3* 9.4*  HCT 31.0* 29.3* 26.7*  MCV 86.1 86.4 87.0  PLT 287 269 237   Cardiac Enzymes: No results for input(s): CKTOTAL, CKMB, CKMBINDEX, TROPONINI in the last 168 hours. BNP (last 3 results) No results for input(s): BNP in the last 8760 hours.  ProBNP (last 3 results)  Recent Labs  01/17/14 2116  PROBNP 961.6*    CBG: No results for input(s): GLUCAP in the last 168 hours.  No results found for this or any previous visit (from the past 240 hour(s)).   Studies: Dg Abd 1 View  05/23/2014   CLINICAL DATA:  81 year old female with a history of small bowel obstruction. Not currently having pain.  EXAM: ABDOMEN - 1 VIEW  COMPARISON:  Plain film 05/21/2014, CT 05/21/2014  FINDINGS: Interval advancement of the gastric tube, which terminates in the left abdomen.  No abnormally distended small bowel or colon. Gas extends to the rectum/sigmoid colon. No unexpected calcifications or radiopaque foreign body.  Vascular calcifications.  Surgical staple line within the left abdomen.  Surgical clips at the inferior liver margin.  IMPRESSION: Interval advancement of the gastric tube, which now terminates in the left abdomen.  Decreased volume of the soft tissue in the mid abdomen, likely after decompression of the stomach which was distended on the most recent CT.  Gas extends through the colon to the rectum/sigmoid colon with no evidence of small bowel or  colonic obstruction. Note that the recent CT demonstrated a more proximal gastric outlet obstruction.  Surgical clips at the inferior liver margin and staple line in the left abdomen.  Signed,  Shannon [name]. Shannon Ave, DO  [facility]  [facility]   Electronically Signed   By: [name]   On: 05/23/2014 07:57    Scheduled Meds: . antiseptic oral rinse  7 mL Mouth Rinse q12n4p  . budesonide-formoterol  2 puff Inhalation BID  . chlorhexidine  15 mL Mouth Rinse BID  . cloNIDine  0.3 mg Transdermal Weekly  . cycloSPORINE  1 drop Both Eyes BID  . gabapentin  300 mg Oral TID  . heparin  5,000 Units Subcutaneous 3 times per day  . hydrALAZINE  10 mg Intravenous 4 times per day  . latanoprost  1 drop Both Eyes QHS  . levothyroxine  100 mcg Intravenous Daily  . metoCLOPramide (REGLAN) injection  10 mg Intravenous 4 times per day  . metoprolol  5 mg Intravenous 4 times per day  . nitroGLYCERIN  0.2 mg Transdermal Daily  . pantoprazole (PROTONIX) IV  40 mg Intravenous Q12H   Continuous Infusions: . dextrose 5 % and 0.9 % NaCl with KCl 20 mEq/L 75 mL/hr at 05/23/14 2300    Principal Problem:   Gastric outlet obstruction Active Problems:   DM (diabetes mellitus) type II uncontrolled, periph vascular disorder   Hypothyroidism   Accelerated hypertension    Time spent: 25 minutes.     Shannon [name] A  [facility] Pager [phone]. If 7PM-7AM, please contact night-coverage at [email or web address], password [facility] 05/24/2014, 8:16 AM  LOS: 3 days

## 2014-05-25 DIAGNOSIS — K3189 Other diseases of stomach and duodenum: Secondary | ICD-10-CM | POA: Diagnosis present

## 2014-05-25 LAB — CBC
HCT: 27.3 % — ABNORMAL LOW (ref 36.0–46.0)
Hemoglobin: 9.6 g/dL — ABNORMAL LOW (ref 12.0–15.0)
MCH: 30 pg (ref 26.0–34.0)
MCHC: 35.2 g/dL (ref 30.0–36.0)
MCV: 85.3 fL (ref 78.0–100.0)
Platelets: 249 10*3/uL (ref 150–400)
RBC: 3.2 MIL/uL — ABNORMAL LOW (ref 3.87–5.11)
RDW: 16.9 % — ABNORMAL HIGH (ref 11.5–15.5)
WBC: 4.6 10*3/uL (ref 4.0–10.5)

## 2014-05-25 LAB — BASIC METABOLIC PANEL
Anion gap: 6 (ref 5–15)
BUN: <5 mg/dL — ABNORMAL LOW (ref 6–23)
CO2: 24 mmol/L (ref 19–32)
Calcium: 9.8 mg/dL (ref 8.4–10.5)
Chloride: 103 mmol/L (ref 96–112)
Creatinine, Ser: 0.95 mg/dL (ref 0.50–1.10)
GFR calc Af Amer: 60 mL/min — ABNORMAL LOW (ref >90)
GFR calc non Af Amer: 52 mL/min — ABNORMAL LOW (ref >90)
Glucose, Bld: 143 mg/dL — ABNORMAL HIGH (ref 70–99)
Potassium: 4 mmol/L (ref 3.5–5.1)
Sodium: 133 mmol/L — ABNORMAL LOW (ref 135–145)

## 2014-05-25 MED ORDER — ISOSORBIDE MONONITRATE ER 30 MG PO TB24
30.0000 mg | ORAL_TABLET | Freq: Every day | ORAL | Status: DC
  Administered 2014-05-25 – 2014-05-30 (×6): 30 mg via ORAL
  Filled 2014-05-25 (×6): qty 1

## 2014-05-25 MED ORDER — AMLODIPINE BESYLATE 10 MG PO TABS
10.0000 mg | ORAL_TABLET | Freq: Every day | ORAL | Status: DC
  Administered 2014-05-25 – 2014-05-30 (×6): 10 mg via ORAL
  Filled 2014-05-25 (×6): qty 1

## 2014-05-25 MED ORDER — BOOST / RESOURCE BREEZE PO LIQD
1.0000 | Freq: Three times a day (TID) | ORAL | Status: DC
  Administered 2014-05-25 – 2014-05-26 (×5): 1 via ORAL

## 2014-05-25 NOTE — Progress Notes (Signed)
NUTRITION FOLLOW UP  Pt meets criteria for severe MALNUTRITION in the context of acute illness as evidenced by <50% estimated energy intake x 5 days, 11.3% wt loss x 3 months.  Intervention:   Resource Breeze po TID, each supplement provides 250 kcal and 9 grams of protein  Nutrition Dx:   Inadequate oral intake related to altered GI function as evidenced by NGT clamped, bowel obstruction; ongoing  Goal:   Pt will meet >90% of estimated nutritional needs; progressing  Monitor:   Diet advancement, PO intake, labs, weight changes, I/O's  Assessment:   Shannon [name] is a 81 y.o. female with history of known gastric outlet obstruction probably related to a perforated pyloric / duodenal ulcer she had in the summer last year. Was most recently seen in December of last year for this problem and underwent upper EGD at that time, tortous outlet but mucosa normal, see Dr. Eugenia [name] EGD note. Patient is on a liquid diet only since that time; however, at times patient does want to eat some more normal food. On Monday she had some sausage in small pieces and on the next day had some pastry. Since then has had increasing abdominal pain and distention until finally she presents to the ED today with N/V.  NGT has been clamped, but remains in place. aPt advanced to a liquid diet. Pt was on clear liquids this AM and tolerated well. Noted 100% of meal completion. RD just noted order to advance diet to full liquids. MD requests supplement for pt and recommends for pt to be on a clear liquid type supplement. RD will add [employer].  Pt family does not want to pursue palliative care consult at this time.  Labs reviewed. Na: 133, BUN <5, Glucose: 143.   Height: Ht Readings from Last 1 Encounters:  04/29/14 5\' 6"  (1.676 m)    Weight Status:   Wt Readings from Last 1 Encounters:  05/22/14 133 lb 11.2 oz (60.646 kg)    Re-estimated needs:  Kcal: 1600-1800 Protein: 66-76 grams Fluid: 1.6-18 L  Skin:  WDL  Diet Order: Diet full liquid   Intake/Output Summary (Last 24 hours) at 05/25/14 1215 Last data filed at 05/25/14 0700  Gross per 24 hour  Intake   1660 ml  Output   1475 ml  Net    185 ml    Last BM: 05/23/14   Labs:   Recent Labs Lab 05/23/14 0440 05/24/14 1002 05/25/14 0509  NA 133* 133* 133*  K 4.2 4.6 4.0  CL 105 105 103  CO2 22 24 24   BUN 16 9 <5*  CREATININE 1.14* 1.07 0.95  CALCIUM 9.3 9.9 9.8  GLUCOSE 157* 173* 143*    CBG (last 3)  No results for input(s): GLUCAP in the last 72 hours.  Scheduled Meds: . antiseptic oral rinse  7 mL Mouth Rinse q12n4p  . budesonide-formoterol  2 puff Inhalation BID  . chlorhexidine  15 mL Mouth Rinse BID  . cloNIDine  0.3 mg Transdermal Weekly  . cycloSPORINE  1 drop Both Eyes BID  . gabapentin  300 mg Oral TID  . heparin  5,000 Units Subcutaneous 3 times per day  . hydrALAZINE  10 mg Intravenous 4 times per day  . latanoprost  1 drop Both Eyes QHS  . levothyroxine  100 mcg Intravenous Daily  . metoCLOPramide (REGLAN) injection  10 mg Intravenous 4 times per day  . metoprolol  5 mg Intravenous 4 times per day  . nitroGLYCERIN  0.2 mg Transdermal Daily  . pantoprazole (PROTONIX) IV  40 mg Intravenous Q12H    Continuous Infusions: . dextrose 5 % and 0.9 % NaCl with KCl 20 mEq/L 75 mL/hr at 05/24/14 2252    Shannon [name] A. Jimmye Norman, RD, LDN, CDE Pager: [phone] After hours Pager: [phone]

## 2014-05-25 NOTE — Progress Notes (Signed)
Patient had several c/o NG tube hurting her throat and making it hard for her to swallow. Paged MD several times but received no response. Patient has been tolerating liquid diet well without any nausea or vomiting.  Removed patient NG tube followed by MD progress note.

## 2014-05-25 NOTE — Evaluation (Signed)
Physical Therapy Evaluation Patient Details Name: Shannon [name] MRN: [id number] DOB: 29-Sep-1923 Today's Date: 05/25/2014   History of Present Illness  Shannon [name] is a 81 y.o. female with history of known gastric outlet obstruction probably related to a perforated pyloric / duodenal ulcer she had in the summer last year. Patient is on a liquid diet only since that time; however, at times patient does want to eat some more normal food. On 2/15 had small pieces of food since then has had increasing abdominal pain and distention until finally she presented to the ED 2/18 with N/V.  Clinical Impression  Pt sweet and soft spoken who has been living with dgtr since her spouse passed and caring for herself with transfers, gait and ADLs. Pt eager to return to caring for herself and encouraged her to be OOB daily and to walk with staff throughout the day. Pt with decreased activity tolerance and strength from baseline who will benefit from acute therapy to maximize strength and function to increase independence prior to D/C.     Follow Up Recommendations No PT follow up    Equipment Recommendations  None recommended by PT    Recommendations for Other Services       Precautions / Restrictions Precautions Precaution Comments: NGT      Mobility  Bed Mobility               General bed mobility comments: in chair on arrival  Transfers Overall transfer level: Needs assistance   Transfers: Sit to/from Stand Sit to Stand: Supervision         General transfer comment: cues for hand placement x2 with sit<>stand at chair, rollator and [facility]  Ambulation/Gait Ambulation/Gait assistance: Supervision Ambulation Distance (Feet): 1000 Feet Assistive device: 4-wheeled walker Gait Pattern/deviations: Step-through pattern;Decreased stride length;Trunk flexed   Gait velocity interpretation: Below normal speed for age/gender General Gait Details: cues for posture and to step closer to  rollator  Stairs            Wheelchair Mobility    Modified Rankin (Stroke Patients Only)       Balance Overall balance assessment: Needs assistance   Sitting balance-Leahy Scale: Good       Standing balance-Leahy Scale: Fair                               Pertinent Vitals/Pain Pain Assessment: No/denies pain    Home Living Family/patient expects to be discharged to:: Private residence Living Arrangements: Children Available Help at Discharge: Family;Available 24 hours/day Type of Home: House Home Access: Ramped entrance     Home Layout: One level Home Equipment: Walker - 2 wheels;Walker - 4 wheels;Cane - single point;Shower seat      Prior Function Level of Independence: Independent with assistive device(s)         Comments: just started using rollator a few weeks ago. Dgtr does the housework. Pt does her own ADLs     Hand Dominance   Dominant Hand: Right    Extremity/Trunk Assessment   Upper Extremity Assessment: Generalized weakness           Lower Extremity Assessment: Generalized weakness      Cervical / Trunk Assessment: Normal  Communication   Communication: No difficulties  Cognition Arousal/Alertness: Awake/alert Behavior During Therapy: WFL for tasks assessed/performed Overall Cognitive Status: Within Functional Limits for tasks assessed  General Comments      Exercises        Assessment/Plan    PT Assessment Patient needs continued PT services  PT Diagnosis Generalized weakness   PT Problem List Decreased strength;Decreased activity tolerance;Decreased knowledge of use of DME;Decreased balance  PT Treatment Interventions Gait training;Functional mobility training;DME instruction;Therapeutic activities;Therapeutic exercise;Patient/family education   PT Goals (Current goals can be found in the Care Plan section) Acute Rehab PT Goals Patient Stated Goal: be stronger to go  home PT Goal Formulation: With patient Time For Goal Achievement: 06/01/14 Potential to Achieve Goals: Good    Frequency Min 3X/week   Barriers to discharge        Co-evaluation               End of Session   Activity Tolerance: Patient tolerated treatment well Patient left: in chair;with call bell/phone within reach Nurse Communication: Mobility status         Time: 1914-7829 PT Time Calculation (min) (ACUTE ONLY): 25 min   Charges:   PT Evaluation $Initial PT Evaluation Tier I: 1 Procedure PT Treatments $Gait Training: 8-22 mins   PT G CodesDelorse Lek 05/25/2014, 2:03 PM Delaney Meigs, PT [phone]

## 2014-05-25 NOTE — Progress Notes (Signed)
Daily Rounding Note  05/25/2014, 10:58 AM  LOS: 4 days   SUBJECTIVE:       Denies abdominal pain or nausea/vomiting. Passing some flatus. Last BM was 05/23/14. NG tube was clamped yesterday but remains in place. Tolerating clear liquids since yesterday at lunch.  OBJECTIVE:         Vital signs in last 24 hours:    Temp:  [98 F (36.7 C)-98.7 F (37.1 C)] 98.7 F (37.1 C) (02/22 0555) Pulse Rate:  [68-78] 78 (02/22 0555) Resp:  [16-18] 17 (02/22 0555) BP: (169-182)/(59-80) 169/71 mmHg (02/22 0555) SpO2:  [95 %-100 %] 95 % (02/22 0845) Last BM Date: 05/23/14 Filed Weights   05/22/14 0104  Weight: 133 lb 11.2 oz (60.646 kg)   General: Pleasant, alert, cachectic appearing.  NG tube in place but clamped. Heart: RRR. No MRG. S1/S2 audible Chest: CTA bil.  No cough or dyspnea Abdomen: Soft, NT, ND. Normal active bowel sounds. No masses. No succession splash. Extremities: No CCE. Neuro/Psych:  Appropriate, calm.  Intake/Output from previous day: 02/21 0701 - 02/22 0700 In: 1660 [P.O.:1060; I.V.:600] Out: 2075 [Urine:2075]  Intake/Output this shift:    Lab Results:  Recent Labs  05/23/14 0440 05/25/14 0509  WBC 5.0 4.6  HGB 9.4* 9.6*  HCT 26.7* 27.3*  PLT 237 249   BMET  Recent Labs  05/23/14 0440 05/24/14 1002 05/25/14 0509  NA 133* 133* 133*  K 4.2 4.6 4.0  CL 105 105 103  CO2 22 24 24   GLUCOSE 157* 173* 143*  BUN 16 9 <5*  CREATININE 1.14* 1.07 0.95  CALCIUM 9.3 9.9 9.8   Studies/Results: No results found.   Scheduled Meds: . antiseptic oral rinse  7 mL Mouth Rinse q12n4p  . budesonide-formoterol  2 puff Inhalation BID  . chlorhexidine  15 mL Mouth Rinse BID  . cloNIDine  0.3 mg Transdermal Weekly  . cycloSPORINE  1 drop Both Eyes BID  . gabapentin  300 mg Oral TID  . heparin  5,000 Units Subcutaneous 3 times per day  . hydrALAZINE  10 mg Intravenous 4 times per day  . latanoprost  1  drop Both Eyes QHS  . levothyroxine  100 mcg Intravenous Daily  . metoCLOPramide (REGLAN) injection  10 mg Intravenous 4 times per day  . metoprolol  5 mg Intravenous 4 times per day  . nitroGLYCERIN  0.2 mg Transdermal Daily  . pantoprazole (PROTONIX) IV  40 mg Intravenous Q12H   Continuous Infusions: . dextrose 5 % and 0.9 % NaCl with KCl 20 mEq/L 75 mL/hr at 05/24/14 2252   PRN Meds:.acetaminophen, albuterol, polyvinyl alcohol ASSESMENT:   *  Functional GOO.   On IV Protonix, IV Reglan, NG tube clamped for ~ 24 hours.  Tolerating clears..  Status post remote gastro-jejunostomy.   S/p 09/2013 G-J anastomosis resection and G-J revision after perforation of anastomotic ulcer.  GOO 01/2014 with EGD x 2: #1 with no anatomic outlet obstruction, normal looking pylorus/duodenum.  #2: to break up Bezoar.  CT 03/12/2014: recurrent Bezoar, thickened Pyloric channel. Suspected the distorted anatomy was causing at the least a partial outlet obstruction.  EGD 03/19/14: retained fluid, tortuous/narrowed anatomy at prepylorus/pylorus but no stricture or ulcers.  UGI/SBFT 03/25/14: normal. 05/21/14 CT scan:   Marked gastric distention, stomach filled with food/fluid. Thickened pyloric wall. Stable liver lesions.  Barium swallow 05/22/14: small paraesophageal hernia, barium trickled into duodenum.  No contrast extravasation.   *  Hyponatremia, improved.   *  Normocytic anemia. Chronic.  FOBT negative.    PLAN   *  Remove NGT?  For now will advanced to full liquids. If these tolerated, pull NGT.  *  Leave on IV BID Protonix for now. Continue scheduled IV Reglan. Trial full liquids before making decision about removing NG tube.     Azucena Freed  05/25/2014, 10:58 AM Pager: [phone]     Attending physician's note   I have taken an interval history, reviewed the chart and examined the patient. I agree with the Advanced Practitioner's note, impression and recommendations. Advance to full liquids  but no further. Continue Reglan for now. Long discussion with the patient and her daughter regarding mgmt options. Essentially covered same topics as Dr. Carlean Purl had covered yesterday. Over 20 minutes counseling patient and her daughter. Not sure a stent would help here but will discuss with Dr. Ardis Hughs who suggested possibility of a stent.   Pricilla Riffle. Fuller Plan, MD [facility]

## 2014-05-25 NOTE — Progress Notes (Signed)
[facility] PROGRESS NOTE  Shannon [name] VWU:[id number] DOB: 1923-05-17 DOA: 05/21/2014 PCP: Oneal Grout, MD  Assessment/Plan: 1-Gastric Outlet Obstruction:  NG tube in place.  Clear diet. Nutritionist consulted.  GI following.  IV fluids. IV protonix. IV Reglan.  KUB no bowel obstruction.   2-Hypothyroidism: Synthroid IV. Change to oral when NGT remove and patient tolerating diet.   3-DM; does not appear to be on any home meds for this at baseline  4-HTN; clonidine patch. IV metoprolol. Hold lisinopril, Imdur, Norvasc while NPO. IV hydralazine. Holder parameter for BP.   5-DVT prophylaxis: heparin.   6-Hyponatremia; continue with IV fluids. Hold salt tablet. Stable.   7-History of seizure; not on medications. Patient relates she was taking off medications for seizure while back. She is on gabapentin for neuropathy.   8-Anemia; follow trend. Hb stable  9-AKI; improving on IV fluids. Cr peak to 1.6. Cr has decrease to 1.1.  10-Severe Malnutrition: acute on chronic illness. Nutrition consulted,.   Code Status: DNR Family Communication: Care discussed with patient.  Disposition Plan: Remain inpatient,.    Consultants:  GI.   Procedures:  none  Antibiotics:  none  HPI/Subjective: Feeling ok, tolerating clears. Denies abdominal pain.   Objective: Filed Vitals:   05/25/14 0555  BP: 169/71  Pulse: 78  Temp: 98.7 F (37.1 C)  Resp: 17    Intake/Output Summary (Last 24 hours) at 05/25/14 0842 Last data filed at 05/25/14 0600  Gross per 24 hour  Intake   1585 ml  Output   1775 ml  Net   -190 ml   Filed Weights   05/22/14 0104  Weight: 60.646 kg (133 lb 11.2 oz)    Exam:   General:  Alert in no distress, NGT in place.   Cardiovascular: S 1, S 2 RRR  Respiratory: CTA  Abdomen: BS, abdomen less  distended, tenderness, no rigidity  Musculoskeletal: no edema.   Data Reviewed: Basic Metabolic Panel:  Recent Labs Lab 05/21/14 1636  05/22/14 0550 05/23/14 0440 05/24/14 1002 05/25/14 0509  NA 130* 131* 133* 133* 133*  K 3.9 3.9 4.2 4.6 4.0  CL 95* 98 105 105 103  CO2 26 29 22 24 24   GLUCOSE 110* 112* 157* 173* 143*  BUN 21 19 16 9  <5*  CREATININE 1.62* 1.37* 1.14* 1.07 0.95  CALCIUM 11.0* 9.9 9.3 9.9 9.8   Liver Function Tests:  Recent Labs Lab 05/21/14 1636  AST 39*  ALT 24  ALKPHOS 73  BILITOT 0.9  PROT 7.7  ALBUMIN 4.2    Recent Labs Lab 05/21/14 1636  LIPASE 38   No results for input(s): AMMONIA in the last 168 hours. CBC:  Recent Labs Lab 05/21/14 1636 05/22/14 0550 05/23/14 0440 05/25/14 0509  WBC 5.8 6.2 5.0 4.6  NEUTROABS 4.4  --   --   --   HGB 10.8* 10.3* 9.4* 9.6*  HCT 31.0* 29.3* 26.7* 27.3*  MCV 86.1 86.4 87.0 85.3  PLT 287 269 237 249   Cardiac Enzymes: No results for input(s): CKTOTAL, CKMB, CKMBINDEX, TROPONINI in the last 168 hours. BNP (last 3 results) No results for input(s): BNP in the last 8760 hours.  ProBNP (last 3 results)  Recent Labs  01/17/14 2116  PROBNP 961.6*    CBG: No results for input(s): GLUCAP in the last 168 hours.  No results found for this or any previous visit (from the past 240 hour(s)).   Studies: No results found.  Scheduled Meds: . antiseptic oral rinse  7 mL Mouth Rinse q12n4p  . budesonide-formoterol  2 puff Inhalation BID  . chlorhexidine  15 mL Mouth Rinse BID  . cloNIDine  0.3 mg Transdermal Weekly  . cycloSPORINE  1 drop Both Eyes BID  . gabapentin  300 mg Oral TID  . heparin  5,000 Units Subcutaneous 3 times per day  . hydrALAZINE  10 mg Intravenous 4 times per day  . latanoprost  1 drop Both Eyes QHS  . levothyroxine  100 mcg Intravenous Daily  . metoCLOPramide (REGLAN) injection  10 mg Intravenous 4 times per day  . metoprolol  5 mg Intravenous 4 times per day  . nitroGLYCERIN  0.2 mg Transdermal Daily  . pantoprazole (PROTONIX) IV  40 mg Intravenous Q12H   Continuous Infusions: . dextrose 5 % and 0.9 % NaCl  with KCl 20 mEq/L 75 mL/hr at 05/24/14 2252    Principal Problem:   Gastric outlet obstruction Active Problems:   DM (diabetes mellitus) type II uncontrolled, periph vascular disorder   Hypothyroidism   Accelerated hypertension    Time spent: 25 minutes.     [name]  [facility] Pager [phone]. If 7PM-7AM, please contact night-coverage at [email or web address], password [facility] 05/25/2014, 8:42 AM  LOS: 4 days

## 2014-05-25 NOTE — Clinical Documentation Improvement (Signed)
Please specify diagnosis related to below supporting information if appropriate.   Possible Clinical Conditions? Severe Malnutrition   Protein Calorie Malnutrition Severe Protein Calorie Malnutrition Other Condition Cannot clinically determine  Supporting Information:  PROGRESS 05/22/2014    -Severe malnutrition in the context of acute illness or injury  Pt meets criteria for severe MALNUTRITION in the context of acute illness as evidenced by <50% estimated energy intake x 5 days, 11.3% wt loss x 3 months.  INTERVENTION:   -RD to follow for diet advancement  -Supplement diet as appropriate    Thank You, Serena Colonel ,RN Clinical Documentation Specialist:  [place] [employer]

## 2014-05-26 ENCOUNTER — Encounter: Payer: Medicare Other | Admitting: Internal Medicine

## 2014-05-26 ENCOUNTER — Encounter: Payer: Self-pay | Admitting: Internal Medicine

## 2014-05-26 LAB — BASIC METABOLIC PANEL
Anion gap: 5 (ref 5–15)
BUN: 5 mg/dL — ABNORMAL LOW (ref 6–23)
CO2: 26 mmol/L (ref 19–32)
Calcium: 9.7 mg/dL (ref 8.4–10.5)
Chloride: 99 mmol/L (ref 96–112)
Creatinine, Ser: 0.96 mg/dL (ref 0.50–1.10)
GFR calc Af Amer: 59 mL/min — ABNORMAL LOW (ref >90)
GFR calc non Af Amer: 51 mL/min — ABNORMAL LOW (ref >90)
Glucose, Bld: 175 mg/dL — ABNORMAL HIGH (ref 70–99)
Potassium: 3.9 mmol/L (ref 3.5–5.1)
Sodium: 130 mmol/L — ABNORMAL LOW (ref 135–145)

## 2014-05-26 LAB — GLUCOSE, CAPILLARY: Glucose-Capillary: 144 mg/dL — ABNORMAL HIGH (ref 70–99)

## 2014-05-26 MED ORDER — HYDRALAZINE HCL 50 MG PO TABS
100.0000 mg | ORAL_TABLET | Freq: Three times a day (TID) | ORAL | Status: DC
  Administered 2014-05-26 – 2014-05-30 (×13): 100 mg via ORAL
  Filled 2014-05-26 (×6): qty 2
  Filled 2014-05-26: qty 4
  Filled 2014-05-26 (×4): qty 2

## 2014-05-26 MED ORDER — PANTOPRAZOLE SODIUM 40 MG PO TBEC
40.0000 mg | DELAYED_RELEASE_TABLET | Freq: Every day | ORAL | Status: DC
  Administered 2014-05-27 – 2014-05-30 (×4): 40 mg via ORAL
  Filled 2014-05-26 (×3): qty 1

## 2014-05-26 MED ORDER — METOPROLOL SUCCINATE ER 25 MG PO TB24
25.0000 mg | ORAL_TABLET | Freq: Every day | ORAL | Status: DC
  Administered 2014-05-26 – 2014-05-29 (×4): 25 mg via ORAL
  Filled 2014-05-26 (×4): qty 1

## 2014-05-26 MED ORDER — SODIUM CHLORIDE 0.9 % IV SOLN
INTRAVENOUS | Status: DC
  Administered 2014-05-26 (×2): via INTRAVENOUS

## 2014-05-26 MED ORDER — LEVOTHYROXINE SODIUM 100 MCG PO TABS
200.0000 ug | ORAL_TABLET | Freq: Every day | ORAL | Status: DC
  Administered 2014-05-27 – 2014-05-28 (×2): 200 ug via ORAL
  Filled 2014-05-26: qty 4
  Filled 2014-05-26: qty 2

## 2014-05-26 MED ORDER — HYDRALAZINE HCL 100 MG PO TABS
100.0000 mg | ORAL_TABLET | Freq: Three times a day (TID) | ORAL | Status: DC
  Filled 2014-05-26 (×3): qty 1

## 2014-05-26 MED ORDER — METOCLOPRAMIDE HCL 10 MG PO TABS
10.0000 mg | ORAL_TABLET | Freq: Three times a day (TID) | ORAL | Status: DC
  Administered 2014-05-26 – 2014-05-30 (×17): 10 mg via ORAL
  Filled 2014-05-26 (×17): qty 1

## 2014-05-26 NOTE — Progress Notes (Addendum)
Daily Rounding Note  05/26/2014, 11:35 AM  LOS: 5 days   SUBJECTIVE:       Tolerated full liquids this AM, consumed at least 50% of the tray. NGT removed last night.  Good sized, formed stool last night.  Some flatus  OBJECTIVE:         Vital signs in last 24 hours:    Temp:  [98.5 F (36.9 C)-98.7 F (37.1 C)] 98.5 F (36.9 C) (02/23 0503) Pulse Rate:  [74-79] 79 (02/23 0503) Resp:  [17-19] 18 (02/23 0503) BP: (164-187)/(68-82) 183/82 mmHg (02/23 0503) SpO2:  [98 %-99 %] 98 % (02/23 0503) Last BM Date: 05/23/14 Filed Weights   05/22/14 0104  Weight: 133 lb 11.2 oz (60.646 kg)   General: thin, comfortable, frail but not acutely ill looking   Heart: RRR Chest: clear.  No cough or labored breathing Abdomen: soft, NT, ND.  BS hypoactive  Extremities: no CCE Neuro/Psych:  Fine resting tremor in hands (pt says it is long standing and no worse than before), no asterixis, no rigidity or cogwheeling.  No lip smacking.   Intake/Output from previous day: 02/22 0701 - 02/23 0700 In: -  Out: 350 [Urine:350]  Intake/Output this shift: Total I/O In: 240 [P.O.:240] Out: 350 [Urine:350]  Lab Results:  Recent Labs  05/25/14 0509  WBC 4.6  HGB 9.6*  HCT 27.3*  PLT 249   BMET  Recent Labs  05/24/14 1002 05/25/14 0509 05/26/14 0535  NA 133* 133* 130*  K 4.6 4.0 3.9  CL 105 103 99  CO2 24 24 26   GLUCOSE 173* 143* 175*  BUN 9 <5* 5*  CREATININE 1.07 0.95 0.96  CALCIUM 9.9 9.8 9.7   Scheduled Meds: . amLODipine  10 mg Oral Daily  . antiseptic oral rinse  7 mL Mouth Rinse q12n4p  . budesonide-formoterol  2 puff Inhalation BID  . chlorhexidine  15 mL Mouth Rinse BID  . cloNIDine  0.3 mg Transdermal Weekly  . cycloSPORINE  1 drop Both Eyes BID  . feeding supplement (RESOURCE BREEZE)  1 Container Oral TID BM  . gabapentin  300 mg Oral TID  . heparin  5,000 Units Subcutaneous 3 times per day  . hydrALAZINE   100 mg Oral TID  . isosorbide mononitrate  30 mg Oral Daily  . latanoprost  1 drop Both Eyes QHS  . levothyroxine  100 mcg Intravenous Daily  . metoCLOPramide (REGLAN) injection  10 mg Intravenous 4 times per day  . metoprolol  5 mg Intravenous 4 times per day  . nitroGLYCERIN  0.2 mg Transdermal Daily  . pantoprazole (PROTONIX) IV  40 mg Intravenous Q12H   Continuous Infusions: . sodium chloride 100 mL/hr at 05/26/14 1017   PRN Meds:.acetaminophen, albuterol, polyvinyl alcohol   ASSESMENT:   * Functional GOO. On IV Protonix, IV Reglan, NG tube clamped for ~ 24 hours. Tolerating clears.. Status post remote gastro-jejunostomy and suspected vagotomy.   S/p 09/2013 G-J anastomosis resection and G-J revision after perforation of anastomotic ulcer.  GOO 01/2014 with EGD x 2: #1 with no anatomic outlet obstruction, normal looking pylorus/duodenum. #2: to break up Bezoar.  CT 03/12/2014: recurrent Bezoar, thickened Pyloric channel. Suspected the distorted anatomy was causing at the least a partial outlet obstruction.  EGD 03/19/14: retained fluid, tortuous/narrowed anatomy at prepylorus/pylorus but no stricture or ulcers.  UGI/SBFT 03/25/14: normal. 05/21/14 CT scan: Marked gastric distention, stomach filled with food/fluid. Thickened pyloric wall. Stable  liver lesions.  Barium swallow 05/22/14: small paraesophageal hernia, barium trickled into duodenum. No contrast extravasation.   * Hyponatremia.   * Normocytic anemia. Chronic. FOBT negative.    PLAN   *  Continue full liquid diet for now will not restrict to carb mod parameters.   *  Observe for extra-pyramidal neuro changes on Reglan.   *  Switch to daily po PPI, oral Reglan.    Shannon [name]  05/26/2014, 11:35 AM Pager: [phone]     Attending physician's note   I have taken an interval history, reviewed the chart and examined the patient. I agree with the Advanced Practitioner's note, impression and  recommendations. NG tube removed yesterday. I have reviewed her management options with Dr. Ardis Hughs and Dr. Carlean Purl who have both recently been involved in her care. Enteral stents are not generally indicated for benign disease so we have no plans for enteral stent placement. She is tolerating a full liquid diet quite well today. She previously failed a pureed diet so will leave on full liquids and I recommend that she remain on a full liquid diet as a long term plan. She will need follow up with Dr. Henrene Pastor after discharge. Changing Protonix and Reglan to PO today. Optimal mgmt of her DM is very important for optimal GI motility. Close follow up with her PCP is essential. If she is doing well tomorrow would consider discharge.    Pricilla Riffle. Fuller Plan, MD [facility]

## 2014-05-26 NOTE — Care Management (Signed)
05-26-14 Important message from Medicare given to patient . Breyona Swander RN BSN         

## 2014-05-26 NOTE — Progress Notes (Addendum)
[facility] PROGRESS NOTE  Ashantae Breck JYN:[id number] DOB: 19-Feb-1924 DOA: 05/21/2014 PCP: Oneal Grout, MD  Assessment/Plan: 81 year old with PMH significant for  History of gastric outlet obstruction, h/o  perforated pyloric / duodenal ulcer she had in the summer last year who present with abdominal pain, distension, nausea and vomiting. She had NG tube place in the Ed yielding 500 cc thick fluids. GI has been helping with patient care. Patient has improved on IV Reglan.   1-Gastric Outlet Obstruction:  NG tube removed 2-22.  Diet advance today.  GI following.  IV fluids. protonix.  Reglan change to oral.  KUB no bowel obstruction.   2-Hypothyroidism: change synthroid to oral.   3-DM; does not appear to be on any home meds for this at baseline  4-HTN; clonidine patch. Change IV metoprolol to oral. Resume  Imdur, Norvasc,  hydralazine.   5-DVT prophylaxis: heparin.   6-Hyponatremia; continue with IV fluids. Hold salt tablet. Stable.   7-History of seizure; not on medications. Patient relates she was taking off medications for seizure while back. She is on gabapentin for neuropathy.   8-Anemia; follow trend. Hb stable  9-AKI; improving on IV fluids. Cr peak to 1.6. Cr has decrease to 1.1.  10-Severe Malnutrition: acute on chronic illness. Nutrition consulted,.   Code Status: DNR Family Communication: Care discussed with patient.  Disposition Plan: Remain inpatient,.    Consultants:  GI.   Procedures:  none  Antibiotics:  none  HPI/Subjective: Feeling ok, tolerating full liquid diet. Denies abdominal pain.  NG tube removed last night.   Objective: Filed Vitals:   05/26/14 0503  BP: 183/82  Pulse: 79  Temp: 98.5 F (36.9 C)  Resp: 18    Intake/Output Summary (Last 24 hours) at 05/26/14 0941 Last data filed at 05/26/14 0831  Gross per 24 hour  Intake      0 ml  Output    700 ml  Net   -700 ml   Filed Weights   05/22/14 0104  Weight: 60.646  kg (133 lb 11.2 oz)    Exam:   General:  Alert in no distress,  Cardiovascular: S 1, S 2 RRR  Respiratory: CTA  Abdomen: BS, abdomen less  distended, tenderness, no rigidity  Musculoskeletal: no edema.   Data Reviewed: Basic Metabolic Panel:  Recent Labs Lab 05/22/14 0550 05/23/14 0440 05/24/14 1002 05/25/14 0509 05/26/14 0535  NA 131* 133* 133* 133* 130*  K 3.9 4.2 4.6 4.0 3.9  CL 98 105 105 103 99  CO2 29 22 24 24 26   GLUCOSE 112* 157* 173* 143* 175*  BUN 19 16 9  <5* 5*  CREATININE 1.37* 1.14* 1.07 0.95 0.96  CALCIUM 9.9 9.3 9.9 9.8 9.7   Liver Function Tests:  Recent Labs Lab 05/21/14 1636  AST 39*  ALT 24  ALKPHOS 73  BILITOT 0.9  PROT 7.7  ALBUMIN 4.2    Recent Labs Lab 05/21/14 1636  LIPASE 38   No results for input(s): AMMONIA in the last 168 hours. CBC:  Recent Labs Lab 05/21/14 1636 05/22/14 0550 05/23/14 0440 05/25/14 0509  WBC 5.8 6.2 5.0 4.6  NEUTROABS 4.4  --   --   --   HGB 10.8* 10.3* 9.4* 9.6*  HCT 31.0* 29.3* 26.7* 27.3*  MCV 86.1 86.4 87.0 85.3  PLT 287 269 237 249   Cardiac Enzymes: No results for input(s): CKTOTAL, CKMB, CKMBINDEX, TROPONINI in the last 168 hours. BNP (last 3 results) No results for input(s): BNP  in the last 8760 hours.  ProBNP (last 3 results)  Recent Labs  01/17/14 2116  PROBNP 961.6*    CBG: No results for input(s): GLUCAP in the last 168 hours.  No results found for this or any previous visit (from the past 240 hour(s)).   Studies: No results found.  Scheduled Meds: . amLODipine  10 mg Oral Daily  . antiseptic oral rinse  7 mL Mouth Rinse q12n4p  . budesonide-formoterol  2 puff Inhalation BID  . chlorhexidine  15 mL Mouth Rinse BID  . cloNIDine  0.3 mg Transdermal Weekly  . cycloSPORINE  1 drop Both Eyes BID  . feeding supplement (RESOURCE BREEZE)  1 Container Oral TID BM  . gabapentin  300 mg Oral TID  . heparin  5,000 Units Subcutaneous 3 times per day  . hydrALAZINE  100 mg  Oral TID  . isosorbide mononitrate  30 mg Oral Daily  . latanoprost  1 drop Both Eyes QHS  . levothyroxine  100 mcg Intravenous Daily  . metoCLOPramide (REGLAN) injection  10 mg Intravenous 4 times per day  . metoprolol  5 mg Intravenous 4 times per day  . nitroGLYCERIN  0.2 mg Transdermal Daily  . pantoprazole (PROTONIX) IV  40 mg Intravenous Q12H   Continuous Infusions: . sodium chloride      Principal Problem:   Gastric outlet obstruction Active Problems:   DM (diabetes mellitus) type II uncontrolled, periph vascular disorder   Hypothyroidism   Accelerated hypertension   Gastric distention    Time spent: 25 minutes.     [name]  [facility] Pager [phone]. If 7PM-7AM, please contact night-coverage at [email or web address], password [facility] 05/26/2014, 9:41 AM  LOS: 5 days

## 2014-05-27 LAB — CBC
HCT: 29.3 % — ABNORMAL LOW (ref 36.0–46.0)
Hemoglobin: 10.4 g/dL — ABNORMAL LOW (ref 12.0–15.0)
MCH: 29.9 pg (ref 26.0–34.0)
MCHC: 35.5 g/dL (ref 30.0–36.0)
MCV: 84.2 fL (ref 78.0–100.0)
Platelets: 315 10*3/uL (ref 150–400)
RBC: 3.48 MIL/uL — ABNORMAL LOW (ref 3.87–5.11)
RDW: 16.3 % — ABNORMAL HIGH (ref 11.5–15.5)
WBC: 5.5 10*3/uL (ref 4.0–10.5)

## 2014-05-27 LAB — URINALYSIS, ROUTINE W REFLEX MICROSCOPIC
Bilirubin Urine: NEGATIVE
Glucose, UA: NEGATIVE mg/dL
Hgb urine dipstick: NEGATIVE
Ketones, ur: NEGATIVE mg/dL
Leukocytes, UA: NEGATIVE
Nitrite: NEGATIVE
Protein, ur: 100 mg/dL — AB
Specific Gravity, Urine: 1.01 (ref 1.005–1.030)
Urobilinogen, UA: 0.2 mg/dL (ref 0.0–1.0)
pH: 7.5 (ref 5.0–8.0)

## 2014-05-27 LAB — BASIC METABOLIC PANEL
Anion gap: 8 (ref 5–15)
BUN: 5 mg/dL — ABNORMAL LOW (ref 6–23)
CO2: 24 mmol/L (ref 19–32)
Calcium: 9.5 mg/dL (ref 8.4–10.5)
Chloride: 92 mmol/L — ABNORMAL LOW (ref 96–112)
Creatinine, Ser: 0.88 mg/dL (ref 0.50–1.10)
GFR calc Af Amer: 66 mL/min — ABNORMAL LOW (ref >90)
GFR calc non Af Amer: 57 mL/min — ABNORMAL LOW (ref >90)
Glucose, Bld: 126 mg/dL — ABNORMAL HIGH (ref 70–99)
Potassium: 3.9 mmol/L (ref 3.5–5.1)
Sodium: 124 mmol/L — ABNORMAL LOW (ref 135–145)

## 2014-05-27 LAB — URIC ACID: Uric Acid, Serum: 1.7 mg/dL — ABNORMAL LOW (ref 2.4–7.0)

## 2014-05-27 LAB — URINE MICROSCOPIC-ADD ON

## 2014-05-27 LAB — BRAIN NATRIURETIC PEPTIDE: B Natriuretic Peptide: 195.7 pg/mL — ABNORMAL HIGH (ref 0.0–100.0)

## 2014-05-27 MED ORDER — GLUCERNA SHAKE PO LIQD
237.0000 mL | Freq: Two times a day (BID) | ORAL | Status: DC
  Administered 2014-05-27 – 2014-05-28 (×3): 237 mL via ORAL

## 2014-05-27 MED ORDER — SODIUM CHLORIDE 1 G PO TABS
1.0000 g | ORAL_TABLET | Freq: Two times a day (BID) | ORAL | Status: DC
  Administered 2014-05-27 – 2014-05-28 (×2): 1 g via ORAL
  Filled 2014-05-27 (×4): qty 1

## 2014-05-27 NOTE — Progress Notes (Signed)
Daily Rounding Note  05/27/2014, 9:31 AM  LOS: 6 days   SUBJECTIVE:       Tolerating full liquids, not very hungry.  No nausea, no abdominal pain.  Passing flatus and had BM last night.   OBJECTIVE:         Vital signs in last 24 hours:    Temp:  [97.6 F (36.4 C)-98.4 F (36.9 C)] 98.4 F (36.9 C) (02/24 0511) Pulse Rate:  [73-114] 73 (02/24 0511) Resp:  [18-20] 18 (02/24 0511) BP: (162-186)/(75-84) 162/80 mmHg (02/24 0552) SpO2:  [98 %-100 %] 99 % (02/24 0511) Weight:  [131 lb 9.8 oz (59.7 kg)] 131 lb 9.8 oz (59.7 kg) (02/24 0552) Last BM Date: 05/25/14 Filed Weights   05/22/14 0104 05/27/14 0552  Weight: 133 lb 11.2 oz (60.646 kg) 131 lb 9.8 oz (59.7 kg)   General: thin, frail but alert and comfortable  Heart: RRR.  No MRG Chest: clear bil.  No cough or labored breathing.  Abdomen: soft, hypoactive BS.  No succussion splash.   Extremities: no CCE Neuro/Psych:  Pleasant, appropriate.  Calm.   Intake/Output from previous day: 02/23 0701 - 02/24 0700 In: 3051.7 [P.O.:940; I.V.:2111.7] Out: 1950 [Urine:1950]  Intake/Output this shift:    Lab Results:  Recent Labs  05/25/14 0509 05/27/14 0507  WBC 4.6 5.5  HGB 9.6* 10.4*  HCT 27.3* 29.3*  PLT 249 315   BMET  Recent Labs  05/25/14 0509 05/26/14 0535 05/27/14 0507  NA 133* 130* 124*  K 4.0 3.9 3.9  CL 103 99 92*  CO2 24 26 24   GLUCOSE 143* 175* 126*  BUN <5* 5* 5*  CREATININE 0.95 0.96 0.88  CALCIUM 9.8 9.7 9.5   Scheduled Meds: . amLODipine  10 mg Oral Daily  . antiseptic oral rinse  7 mL Mouth Rinse q12n4p  . budesonide-formoterol  2 puff Inhalation BID  . chlorhexidine  15 mL Mouth Rinse BID  . cloNIDine  0.3 mg Transdermal Weekly  . cycloSPORINE  1 drop Both Eyes BID  . feeding supplement (RESOURCE BREEZE)  1 Container Oral TID BM  . gabapentin  300 mg Oral TID  . heparin  5,000 Units Subcutaneous 3 times per day  . hydrALAZINE   100 mg Oral TID  . isosorbide mononitrate  30 mg Oral Daily  . latanoprost  1 drop Both Eyes QHS  . levothyroxine  200 mcg Oral QAC breakfast  . metoCLOPramide  10 mg Oral TID AC & HS  . metoprolol succinate  25 mg Oral Daily  . nitroGLYCERIN  0.2 mg Transdermal Daily  . pantoprazole  40 mg Oral Q0600   Continuous Infusions: . sodium chloride 100 mL/hr at 05/26/14 2028   PRN Meds:.acetaminophen, albuterol, polyvinyl alcohol  ASSESMENT:   * Functional GOO. On po Protonix, po Reglan, NG tube removed 2/22.  Tolerating clears 2/23, tolerating full liquids since 2/23 PM. Status post remote gastro-jejunostomy and suspected vagotomy.   S/p 09/2013 G-J anastomosis resection and G-J revision after perforation of anastomotic ulcer.  GOO 01/2014 with EGD x 2: #1 with no anatomic outlet obstruction, normal looking pylorus/duodenum. #2: to break up Bezoar.  CT 03/12/2014: recurrent Bezoar, thickened Pyloric channel. Suspected the distorted anatomy was causing at the least a partial outlet obstruction.  EGD 03/19/14: retained fluid, tortuous/narrowed anatomy at prepylorus/pylorus but no stricture or ulcers.  UGI/SBFT 03/25/14: normal. 05/21/14 CT scan: Marked gastric distention, stomach filled with food/fluid. Thickened pyloric wall.  Stable liver lesions.  Barium swallow 05/22/14: small paraesophageal hernia, barium trickled into duodenum. No contrast extravasation.   * Hyponatremia. Still on NS at 100/hour.   * Normocytic anemia. Chronic. FOBT negative.    *  DM. No meds PTA nor currently.  Well controlled.  CBGs 112 - 175.    PLAN   *  Full liquid diet. Could have smoothies, thin gruel consistency foods. If continues to do well and Na level improved, could discharge tomorrow. Per PT note of 2/22 she does not need ongoing PT.  *  Continue Reglan 10 mg AC/HS and daily PPI.  Would not restart Carafate.  *  Watch for s/e of tardive dyskinesia from Reglan.   *  Replaced resource  with BID Glucerna.  *  Check TSH in AM, have no Epic monitoring of this and pt on synthroid.  *  Will sign off. Call if questions. No plans for placing enteral stent. Will get follow up with Dr Henrene Pastor after discharge.     Azucena Freed  05/27/2014, 9:31 AM Pager: [phone]     Attending physician's note   I have taken an interval history, reviewed the chart and examined the patient. I agree with the Advanced Practitioner's note, impression and recommendations. Continue diet recommendations as above. Continue Reglan 10 ac and hs and PPI daily. GI signing off.   Pricilla Riffle. Fuller Plan, MD [facility]

## 2014-05-27 NOTE — Progress Notes (Signed)
Physical Therapy Treatment Patient Details Name: Shannon [name] MRN: [id number] DOB: [birth year] Today's Date: 05/27/2014    History of Present Illness Shannon [name] is a 81 y.o. female with history of known gastric outlet obstruction probably related to a perforated pyloric / duodenal ulcer she had in the summer last year. Patient is on a liquid diet only since that time; however, at times patient does want to eat some more normal food. On 2/15 had small pieces of food since then has had increasing abdominal pain and distention until finally she presented to the ED 2/18 with N/V.    PT Comments    Patient continues to progress well. Initially unsteady but progressing to supervision. Recommend daily ambulation with nursing. Review HEP next session.   Follow Up Recommendations  No PT follow up     Equipment Recommendations  None recommended by PT    Recommendations for Other Services       Precautions / Restrictions      Mobility  Bed Mobility Overal bed mobility: Modified Independent                Transfers Overall transfer level: Modified independent                  Ambulation/Gait Ambulation/Gait assistance: Supervision Ambulation Distance (Feet): 1000 Feet Assistive device: 4-wheeled walker Gait Pattern/deviations: Step-through pattern;Decreased stride length;Trunk flexed   Gait velocity interpretation: Below normal speed for age/gender General Gait Details: cues for posture and to step closer to rollator   Stairs            Wheelchair Mobility    Modified Rankin (Stroke Patients Only)       Balance                                    Cognition Arousal/Alertness: Awake/alert Behavior During Therapy: WFL for tasks assessed/performed Overall Cognitive Status: Within Functional Limits for tasks assessed                      Exercises      General Comments        Pertinent Vitals/Pain Pain Assessment: No/denies  pain    Home Living                      Prior Function            PT Goals (current goals can now be found in the care plan section) Progress towards PT goals: Progressing toward goals    Frequency  Min 3X/week    PT Plan Current plan remains appropriate    Co-evaluation             End of Session   Activity Tolerance: Patient tolerated treatment well Patient left: in chair;with call bell/phone within reach     Time: 1311-1335 PT Time Calculation (min) (ACUTE ONLY): 24 min  Charges:  $Gait Training: 23-37 mins                    G Codes:      Jacqualyn Posey 05/27/2014, 1:52 PM 05/27/2014 Jacqualyn Posey PTA [phone] pager [phone] office

## 2014-05-27 NOTE — Progress Notes (Signed)
[facility] PROGRESS NOTE  Shannon [name] ZOX:[id number] DOB: Jan 30, 1924 DOA: 05/21/2014 PCP: Shannon Grout, MD  Summary I have seen and examined Shannon [name] at bedside and reviewed her chart. I also discussed with her daughter Shannon [name] over the [phone]). Appreciate GI. Shannon [name] is a pleasant 81 year old femalewith PMH significant forDM2/Hypothyroidism/Essential Htn/Chronic Hyponatremia/ History of gastric outlet obstruction, h/o perforated pyloric / duodenal ulcer she had in the summer last year who presented with abdominal pain, distension, nausea and vomiting and was found to have gastric outlet obstruction. CT abdomen and pelvis showed "Marked gastric distention with stomach filled with fluid and food debris and associated with thickening of the pyloric wall consistent with gastric outlet obstruction, question due to peptic ulcer disease with edema or mass/tumor. Stable low to intermediate attenuation lesions within the liver". She had NG tube place in the Ed yielding 500 cc thick fluids. GI has been helping with patient care. Patient has improved on IV Reglan and there are plans for discharge home on pured diet to follow with GI. She has hyponatremia which is apparently chronic and is managed by salt tablets at home. We'll therefore discontinue IV fluids and starts sodium tablet twice a day. Will also check cortisol level/BNP/TSH/uric acid/serum osmolality, among other tests. If she continues to do well, we'll likely DC home in the next 24-48 hours. Plan Gastric outlet obstruction  On PPI and Reglan  Diet per GI  Follow GI recommendations Hyponatremia  Serum osmolality/urine osmolality/BNP/cortisol level/uric acid level  Sodium tablets  Discontinue normal saline DM (diabetes mellitus) type II uncontrolled, periph vascular disorder/Hypothyroidism/Accelerated hypertension  No acute changes  Continue current medications Code Status: DNR. Family Communication: Daughter  Shannon [name] over the [phone] . Disposition Plan: Hopefully home in the next 24-48 hours   Consultants:  GI  Procedures:  None  Antibiotics:  None  HPI/Subjective: Feels okay. No complaints.  Objective: Filed Vitals:   05/27/14 1409  BP: 174/106  Pulse: 89  Temp: 98.6 F (37 C)  Resp: 18    Intake/Output Summary (Last 24 hours) at 05/27/14 1431 Last data filed at 05/27/14 1148  Gross per 24 hour  Intake 3051.67 ml  Output   2150 ml  Net 901.67 ml   Filed Weights   05/22/14 0104 05/27/14 0552  Weight: 60.646 kg (133 lb 11.2 oz) 59.7 kg (131 lb 9.8 oz)    Exam:   General:  Comfortable at rest.  Cardiovascular: S1-S2 normal. No murmurs. Pulse regular.  Respiratory: Good air entry bilaterally. No rhonchi or rales.  Abdomen: Soft and nontender. Normal bowel sounds. No organomegaly.  Musculoskeletal: No pedal edema   Neurological: Intact  Data Reviewed: Basic Metabolic Panel:  Recent Labs Lab 05/23/14 0440 05/24/14 1002 05/25/14 0509 05/26/14 0535 05/27/14 0507  NA 133* 133* 133* 130* 124*  K 4.2 4.6 4.0 3.9 3.9  CL 105 105 103 99 92*  CO2 22 24 24 26 24   GLUCOSE 157* 173* 143* 175* 126*  BUN 16 9 <5* 5* 5*  CREATININE 1.14* 1.07 0.95 0.96 0.88  CALCIUM 9.3 9.9 9.8 9.7 9.5   Liver Function Tests:  Recent Labs Lab 05/21/14 1636  AST 39*  ALT 24  ALKPHOS 73  BILITOT 0.9  PROT 7.7  ALBUMIN 4.2    Recent Labs Lab 05/21/14 1636  LIPASE 38   No results for input(s): AMMONIA in the last 168 hours. CBC:  Recent Labs Lab 05/21/14 1636 05/22/14 0550 05/23/14 0440 05/25/14 0509 05/27/14 0507  WBC 5.8 6.2  5.0 4.6 5.5  NEUTROABS 4.4  --   --   --   --   HGB 10.8* 10.3* 9.4* 9.6* 10.4*  HCT 31.0* 29.3* 26.7* 27.3* 29.3*  MCV 86.1 86.4 87.0 85.3 84.2  PLT 287 269 237 249 315   Cardiac Enzymes: No results for input(s): CKTOTAL, CKMB, CKMBINDEX, TROPONINI in the last 168 hours. BNP (last 3 results) No results for input(s):  BNP in the last 8760 hours.  ProBNP (last 3 results)  Recent Labs  01/17/14 2116  PROBNP 961.6*    CBG:  Recent Labs Lab 05/26/14 1724  GLUCAP 144*    No results found for this or any previous visit (from the past 240 hour(s)).   Studies: No results found.  Scheduled Meds: . amLODipine  10 mg Oral Daily  . antiseptic oral rinse  7 mL Mouth Rinse q12n4p  . budesonide-formoterol  2 puff Inhalation BID  . chlorhexidine  15 mL Mouth Rinse BID  . cloNIDine  0.3 mg Transdermal Weekly  . cycloSPORINE  1 drop Both Eyes BID  . feeding supplement (GLUCERNA SHAKE)  237 mL Oral BID BM  . gabapentin  300 mg Oral TID  . heparin  5,000 Units Subcutaneous 3 times per day  . hydrALAZINE  100 mg Oral TID  . isosorbide mononitrate  30 mg Oral Daily  . latanoprost  1 drop Both Eyes QHS  . levothyroxine  200 mcg Oral QAC breakfast  . metoCLOPramide  10 mg Oral TID AC & HS  . metoprolol succinate  25 mg Oral Daily  . nitroGLYCERIN  0.2 mg Transdermal Daily  . pantoprazole  40 mg Oral Q0600  . sodium chloride  1 g Oral BID WC   Continuous Infusions:    Time spent: 25 minutes    Shannon [name]  [facility] Pager [phone]. If 7PM-7AM, please contact night-coverage at [email or web address], password [facility] 05/27/2014, 2:31 PM  LOS: 6 days

## 2014-05-28 LAB — COMPREHENSIVE METABOLIC PANEL
ALT: 18 U/L (ref 0–35)
AST: 24 U/L (ref 0–37)
Albumin: 3.1 g/dL — ABNORMAL LOW (ref 3.5–5.2)
Alkaline Phosphatase: 58 U/L (ref 39–117)
Anion gap: 11 (ref 5–15)
BUN: 10 mg/dL (ref 6–23)
CO2: 24 mmol/L (ref 19–32)
Calcium: 9.3 mg/dL (ref 8.4–10.5)
Chloride: 86 mmol/L — ABNORMAL LOW (ref 96–112)
Creatinine, Ser: 1.01 mg/dL (ref 0.50–1.10)
GFR calc Af Amer: 56 mL/min — ABNORMAL LOW (ref >90)
GFR calc non Af Amer: 48 mL/min — ABNORMAL LOW (ref >90)
Glucose, Bld: 125 mg/dL — ABNORMAL HIGH (ref 70–99)
Potassium: 4.1 mmol/L (ref 3.5–5.1)
Sodium: 121 mmol/L — ABNORMAL LOW (ref 135–145)
Total Bilirubin: 0.6 mg/dL (ref 0.3–1.2)
Total Protein: 6 g/dL (ref 6.0–8.3)

## 2014-05-28 LAB — CORTISOL: Cortisol, Plasma: 17.8 ug/dL

## 2014-05-28 LAB — CBC
HCT: 27.2 % — ABNORMAL LOW (ref 36.0–46.0)
Hemoglobin: 9.8 g/dL — ABNORMAL LOW (ref 12.0–15.0)
MCH: 29.9 pg (ref 26.0–34.0)
MCHC: 36 g/dL (ref 30.0–36.0)
MCV: 82.9 fL (ref 78.0–100.0)
Platelets: 310 10*3/uL (ref 150–400)
RBC: 3.28 MIL/uL — ABNORMAL LOW (ref 3.87–5.11)
RDW: 15.8 % — ABNORMAL HIGH (ref 11.5–15.5)
WBC: 5.1 10*3/uL (ref 4.0–10.5)

## 2014-05-28 LAB — TSH: TSH: 16.195 u[IU]/mL — ABNORMAL HIGH (ref 0.350–4.500)

## 2014-05-28 LAB — OSMOLALITY: Osmolality: 250 mosm/kg — ABNORMAL LOW (ref 275–300)

## 2014-05-28 LAB — OSMOLALITY, URINE: Osmolality, Ur: 391 mosm/kg (ref 390–1090)

## 2014-05-28 MED ORDER — LEVOTHYROXINE SODIUM 50 MCG PO TABS
250.0000 ug | ORAL_TABLET | Freq: Every day | ORAL | Status: DC
  Administered 2014-05-29 – 2014-05-30 (×2): 250 ug via ORAL
  Filled 2014-05-28: qty 2
  Filled 2014-05-28 (×2): qty 1

## 2014-05-28 MED ORDER — SODIUM CHLORIDE 1 G PO TABS
1.0000 g | ORAL_TABLET | Freq: Three times a day (TID) | ORAL | Status: DC
  Administered 2014-05-28 – 2014-05-29 (×4): 1 g via ORAL
  Filled 2014-05-28 (×6): qty 1

## 2014-05-28 MED ORDER — LEVOTHYROXINE SODIUM 50 MCG PO TABS
50.0000 ug | ORAL_TABLET | Freq: Once | ORAL | Status: DC
  Filled 2014-05-28: qty 1

## 2014-05-28 NOTE — Progress Notes (Signed)
[facility] PROGRESS NOTE  Shannon [name] QIO:[id number] DOB: 1923/12/20 DOA: 05/21/2014 PCP: Oneal Grout, MD  Summary Appreciate GI. Shannon [name] is a pleasant 81 year old femalewith PMH significant forDM2/Hypothyroidism/Essential Htn/Chronic Hyponatremia/ History of gastric outlet obstruction, h/o perforated pyloric / duodenal ulcer she had in the summer last year who presented with abdominal pain, distension, nausea and vomiting and was found to have gastric outlet obstruction. CT abdomen and pelvis showed "Marked gastric distention with stomach filled with fluid and food debris and associated with thickening of the pyloric wall consistent with gastric outlet obstruction, question due to peptic ulcer disease with edema or mass/tumor. Stable low to intermediate attenuation lesions within the liver". She had NG tube place in the ED, yielding 500 cc thick fluids. GI has been helping with patient care. Patient has improved on Reglan and there are plans for discharge home on pured diet to follow with GI. She has hyponatremia which is apparently chronic and is managed by salt tablets at home and the salt tablets were resumed on 05/27/14 but sodium is 121 today. TSH is 16, an improvement from 27 in December 2015. This may be responsible for the hyponatremia. We'll therefore increase Synthroid to 250 g daily. We will give a total of 250 g today. Patient reports that she is tolerating feeding. Will increase the frequency of sodium tablets to 3 times a day. Plan Gastric outlet obstruction  On PPI and Reglan  Diet per GI  Follow GI recommendations Hyponatremia  Serum osmolality/urine osmolality/BNP/cortisol level/uric acid level results reviewed  Increase Sodium tablets frequency to 3 times a day  Discontinue normal saline DM (diabetes mellitus) type II uncontrolled, periph vascular disorder/Hypothyroidism/Accelerated hypertension  Increase Synthroid to 250 g daily  Continue other  medications Code Status: DNR. Family Communication: Daughter [name] over the phone at [phone] . Disposition Plan: Hopefully home in the next 24-48 hours   Consultants:  GI  Procedures:  None  Antibiotics:  None  HPI/Subjective: Has no complaints. No nausea or vomiting.  Objective: Filed Vitals:   05/28/14 0531  BP: 162/70  Pulse: 70  Temp: 98.4 F (36.9 C)  Resp: 18    Intake/Output Summary (Last 24 hours) at 05/28/14 1244 Last data filed at 05/28/14 5366  Gross per 24 hour  Intake    720 ml  Output   1200 ml  Net   -480 ml   Filed Weights   05/22/14 0104 05/27/14 0552  Weight: 60.646 kg (133 lb 11.2 oz) 59.7 kg (131 lb 9.8 oz)    Exam:   General:  Comfortable at rest.  Cardiovascular: S1-S2 normal. No murmurs. Pulse regular.  Respiratory: Good air entry bilaterally. No rhonchi or rales.  Abdomen: Soft and nontender. Normal bowel sounds. No organomegaly.  Musculoskeletal: No pedal edema   Neurological: Intact  Data Reviewed: Basic Metabolic Panel:  Recent Labs Lab 05/24/14 1002 05/25/14 0509 05/26/14 0535 05/27/14 0507 05/28/14 0451  NA 133* 133* 130* 124* 121*  K 4.6 4.0 3.9 3.9 4.1  CL 105 103 99 92* 86*  CO2 24 24 26 24 24   GLUCOSE 173* 143* 175* 126* 125*  BUN 9 <5* 5* 5* 10  CREATININE 1.07 0.95 0.96 0.88 1.01  CALCIUM 9.9 9.8 9.7 9.5 9.3   Liver Function Tests:  Recent Labs Lab 05/21/14 1636 05/28/14 0451  AST 39* 24  ALT 24 18  ALKPHOS 73 58  BILITOT 0.9 0.6  PROT 7.7 6.0  ALBUMIN 4.2 3.1*    Recent Labs Lab 05/21/14  1636  LIPASE 38   No results for input(s): AMMONIA in the last 168 hours. CBC:  Recent Labs Lab 05/21/14 1636 05/22/14 0550 05/23/14 0440 05/25/14 0509 05/27/14 0507 05/28/14 0451  WBC 5.8 6.2 5.0 4.6 5.5 5.1  NEUTROABS 4.4  --   --   --   --   --   HGB 10.8* 10.3* 9.4* 9.6* 10.4* 9.8*  HCT 31.0* 29.3* 26.7* 27.3* 29.3* 27.2*  MCV 86.1 86.4 87.0 85.3 84.2 82.9  PLT 287 269 237 249  315 310   Cardiac Enzymes: No results for input(s): CKTOTAL, CKMB, CKMBINDEX, TROPONINI in the last 168 hours. BNP (last 3 results)  Recent Labs  05/27/14 1630  BNP 195.7*    ProBNP (last 3 results)  Recent Labs  01/17/14 2116  PROBNP 961.6*    CBG:  Recent Labs Lab 05/26/14 1724  GLUCAP 144*    No results found for this or any previous visit (from the past 240 hour(s)).   Studies: No results found.  Scheduled Meds: . amLODipine  10 mg Oral Daily  . budesonide-formoterol  2 puff Inhalation BID  . cloNIDine  0.3 mg Transdermal Weekly  . cycloSPORINE  1 drop Both Eyes BID  . feeding supplement (GLUCERNA SHAKE)  237 mL Oral BID BM  . gabapentin  300 mg Oral TID  . heparin  5,000 Units Subcutaneous 3 times per day  . hydrALAZINE  100 mg Oral TID  . isosorbide mononitrate  30 mg Oral Daily  . latanoprost  1 drop Both Eyes QHS  . [START ON 05/29/2014] levothyroxine  250 mcg Oral QAC breakfast  . levothyroxine  50 mcg Oral Once  . metoCLOPramide  10 mg Oral TID AC & HS  . metoprolol succinate  25 mg Oral Daily  . nitroGLYCERIN  0.2 mg Transdermal Daily  . pantoprazole  40 mg Oral Q0600  . sodium chloride  1 g Oral TID WC   Continuous Infusions:    Time spent: 20 minutes    Shannon [name]  [facility] Pager [phone]. If 7PM-7AM, please contact night-coverage at [email or web address], password [facility] 05/28/2014, 12:44 PM  LOS: 7 days

## 2014-05-29 LAB — BASIC METABOLIC PANEL
Anion gap: 8 (ref 5–15)
Anion gap: 7 (ref 5–15)
Anion gap: 10 (ref 5–15)
BUN: 14 mg/dL (ref 6–23)
BUN: 13 mg/dL (ref 6–23)
BUN: 13 mg/dL (ref 6–23)
CO2: 22 mmol/L (ref 19–32)
CO2: 26 mmol/L (ref 19–32)
CO2: 22 mmol/L (ref 19–32)
Calcium: 8.9 mg/dL (ref 8.4–10.5)
Calcium: 9.2 mg/dL (ref 8.4–10.5)
Calcium: 9 mg/dL (ref 8.4–10.5)
Chloride: 87 mmol/L — ABNORMAL LOW (ref 96–112)
Chloride: 87 mmol/L — ABNORMAL LOW (ref 96–112)
Chloride: 88 mmol/L — ABNORMAL LOW (ref 96–112)
Creatinine, Ser: 0.95 mg/dL (ref 0.50–1.10)
Creatinine, Ser: 0.84 mg/dL (ref 0.50–1.10)
Creatinine, Ser: 0.85 mg/dL (ref 0.50–1.10)
GFR calc Af Amer: 60 mL/min — ABNORMAL LOW (ref >90)
GFR calc Af Amer: 70 mL/min — ABNORMAL LOW (ref >90)
GFR calc Af Amer: 69 mL/min — ABNORMAL LOW (ref >90)
GFR calc non Af Amer: 52 mL/min — ABNORMAL LOW (ref >90)
GFR calc non Af Amer: 60 mL/min — ABNORMAL LOW (ref >90)
GFR calc non Af Amer: 59 mL/min — ABNORMAL LOW (ref >90)
Glucose, Bld: 117 mg/dL — ABNORMAL HIGH (ref 70–99)
Glucose, Bld: 176 mg/dL — ABNORMAL HIGH (ref 70–99)
Glucose, Bld: 154 mg/dL — ABNORMAL HIGH (ref 70–99)
Potassium: 4.2 mmol/L (ref 3.5–5.1)
Potassium: 4.3 mmol/L (ref 3.5–5.1)
Potassium: 4.2 mmol/L (ref 3.5–5.1)
Sodium: 117 mmol/L — CL (ref 135–145)
Sodium: 120 mmol/L — ABNORMAL LOW (ref 135–145)
Sodium: 120 mmol/L — ABNORMAL LOW (ref 135–145)

## 2014-05-29 LAB — CBC
HCT: 28.7 % — ABNORMAL LOW (ref 36.0–46.0)
Hemoglobin: 10.3 g/dL — ABNORMAL LOW (ref 12.0–15.0)
MCH: 29.6 pg (ref 26.0–34.0)
MCHC: 35.9 g/dL (ref 30.0–36.0)
MCV: 82.5 fL (ref 78.0–100.0)
Platelets: 312 10*3/uL (ref 150–400)
RBC: 3.48 MIL/uL — ABNORMAL LOW (ref 3.87–5.11)
RDW: 15.8 % — ABNORMAL HIGH (ref 11.5–15.5)
WBC: 5.2 10*3/uL (ref 4.0–10.5)

## 2014-05-29 MED ORDER — GLUCERNA SHAKE PO LIQD
237.0000 mL | Freq: Three times a day (TID) | ORAL | Status: DC
  Administered 2014-05-29 – 2014-05-30 (×4): 237 mL via ORAL

## 2014-05-29 MED ORDER — METOPROLOL SUCCINATE ER 50 MG PO TB24
50.0000 mg | ORAL_TABLET | Freq: Every day | ORAL | Status: DC
  Administered 2014-05-30: 50 mg via ORAL
  Filled 2014-05-29: qty 1

## 2014-05-29 MED ORDER — SODIUM CHLORIDE 1 G PO TABS
2.0000 g | ORAL_TABLET | Freq: Three times a day (TID) | ORAL | Status: DC
  Administered 2014-05-29 – 2014-05-30 (×3): 2 g via ORAL
  Filled 2014-05-29 (×5): qty 2

## 2014-05-29 MED ORDER — SODIUM CHLORIDE 0.9 % IV SOLN
INTRAVENOUS | Status: DC
  Administered 2014-05-29 – 2014-05-30 (×2): via INTRAVENOUS

## 2014-05-29 NOTE — Progress Notes (Addendum)
NUTRITION FOLLOW UP  Pt meets criteria for severe MALNUTRITION in the context of acute illness as evidenced by <50% estimated energy intake x 5 days, 11.3% wt loss x 3 months.  Intervention:   -Increase Glucerna Shake po to TID, each supplement provides 220 kcal and 10 grams of protein  Nutrition Dx:   Inadequate oral intake related to altered GI function as evidenced by full liquid diet, PO: 50-75%; ongoing  Goal:   Pt will meet >90% of estimated nutritional needs; progressing  Monitor:   Diet advancement, PO intake, labs, weight changes, I/O's  Assessment:   Shannon [name] is a 81 y.o. female with history of known gastric outlet obstruction probably related to a perforated pyloric / duodenal ulcer she had in the summer last year. Was most recently seen in December of last year for this problem and underwent upper EGD at that time, tortous outlet but mucosa normal, see Dr. Eugenia Pancoast EGD note. Patient is on a liquid diet only since that time; however, at times patient does want to eat some more normal food. On Monday she had some sausage in small pieces and on the next day had some pastry. Since then has had increasing abdominal pain and distention until finally she presents to the ED today with N/V.  NGT removed on 05/25/14.  Spoke with pt at bedside who reports appetite is is improving. She remains on a full liquid diet and is tolerating well. Noted 50-75% meal completion. Noted pt ate all of her breakfast except her ice cream and orange juice this morning. Pt is now receiving Glucerna Shake BID ([employer] d/c). Pt reports that she did not like the [employer] supplement, but really enjoys Glucerna. She reports to drinking two yesterday, but is concerned about getting enough calories and protein due to limitations of the full liquid diet. She is agreeable to increase Glucerna to TID. Discussed foods allowed on full liquid diet and rationale for diet order. Also discussed importance of  good PO intake to promote healing. Labs reviewed. Na: 117, Cl: 87, Glucose: 117.   Nutrition Focused Physical Exam:  Subcutaneous Fat:  Orbital Region: moderate depletion Upper Arm Region: moderate depletion Thoracic and Lumbar Region: mild depletion  Muscle:  Temple Region: moderate depletion Clavicle Bone Region: moderate depletion Clavicle and Acromion Bone Region: mild depletion Scapular Bone Region: mild depletion Dorsal Hand: moderate depletion Patellar Region: moderate depletion Anterior Thigh Region: moderate depletion Posterior Calf Region: mild depletion  Edema: none present   Height: Ht Readings from Last 1 Encounters:  05/27/14 5' 5.5" (1.664 m)    Weight Status:   Wt Readings from Last 1 Encounters:  05/27/14 131 lb 9.8 oz (59.7 kg)   05/22/14 133 lb 11.2 oz (60.646 kg)       Re-estimated needs:  Kcal: 1600-1800 Protein: 66-76 grams Fluid: 1.6-18 L  Skin: WDL  Diet Order: Diet full liquid   Intake/Output Summary (Last 24 hours) at 05/29/14 0952 Last data filed at 05/29/14 0800  Gross per 24 hour  Intake      0 ml  Output   1125 ml  Net  -1125 ml    Last BM: 05/27/14   Labs:   Recent Labs Lab 05/27/14 0507 05/28/14 0451 05/29/14 0500  NA 124* 121* 117*  K 3.9 4.1 4.2  CL 92* 86* 87*  CO2 24 24 22   BUN 5* 10 14  CREATININE 0.88 1.01 0.95  CALCIUM 9.5 9.3 8.9  GLUCOSE 126* 125* 117*  CBG (last 3)   Recent Labs  05/26/14 1724  GLUCAP 144*    Scheduled Meds: . amLODipine  10 mg Oral Daily  . budesonide-formoterol  2 puff Inhalation BID  . cloNIDine  0.3 mg Transdermal Weekly  . cycloSPORINE  1 drop Both Eyes BID  . feeding supplement (GLUCERNA SHAKE)  237 mL Oral BID BM  . gabapentin  300 mg Oral TID  . heparin  5,000 Units Subcutaneous 3 times per day  . hydrALAZINE  100 mg Oral TID  . isosorbide mononitrate  30 mg Oral Daily  . latanoprost  1 drop Both Eyes QHS  . levothyroxine  250 mcg Oral QAC breakfast  .  levothyroxine  50 mcg Oral Once  . metoCLOPramide  10 mg Oral TID AC & HS  . metoprolol succinate  25 mg Oral Daily  . nitroGLYCERIN  0.2 mg Transdermal Daily  . pantoprazole  40 mg Oral Q0600  . sodium chloride  1 g Oral TID WC    Continuous Infusions: . sodium chloride      Shatoya Roets A. Jimmye Norman, RD, LDN, CDE Pager: [phone] After hours Pager: [phone]

## 2014-05-29 NOTE — Progress Notes (Signed)
CRITICAL VALUE ALERT  Critical value received: Na+=117  Date of notification:05/29/14  Time of notification: 0609  Critical value read back:yes  Nurse who received alert:Eleno Weimar  MD notified (1st page): Alysia Penna  Time of first page:  0610  MD notified (2nd page):  Time of second page:  Responding MD:  Clance Boll, NP  Time MD responded: [phone]

## 2014-05-29 NOTE — Progress Notes (Signed)
Physical Therapy Treatment Patient Details Name: Shannon [name] MRN: [id number] DOB: [birth year] Today's Date: 05/29/2014    History of Present Illness Harmonei Calzado is a 81 y.o. female with history of known gastric outlet obstruction probably related to a perforated pyloric / duodenal ulcer she had in the summer last year. Patient is on a liquid diet only since that time; however, at times patient does want to eat some more normal food. On 2/15 had small pieces of food since then has had increasing abdominal pain and distention until finally she presented to the ED 2/18 with N/V.    PT Comments    Patient with increased assist needed for transfers this session.  Also not correcting posture, mobility or demonstrating safe transfer techniques even with cues.  Feel safety/fall risk may be increased over last sessions and feel HHPT indicated to ensure safety with mobility at home.  Also recommend OT consult to assess ADL's as this 81 y.o. Female has been independent in these at home.  Will follow acutely.  Follow Up Recommendations  Supervision/Assistance - 24 hour;Home health PT     Equipment Recommendations  None recommended by PT    Recommendations for Other Services  OT consult     Precautions / Restrictions Precautions Precautions: Fall    Mobility  Bed Mobility               General bed mobility comments: in chair on arrival  Transfers Overall transfer level: Needs assistance Equipment used: 4-wheeled walker Transfers: Sit to/from Stand Sit to Stand: Min assist         General transfer comment: initiated transfer from deep in chair despite cues to scoot to edge, then fell back on chair, encouraged to scoot forward, but still stood with assist from deep in chair.  Held onto walker when returning to sit.  Ambulation/Gait Ambulation/Gait assistance: Supervision Ambulation Distance (Feet): 1000 Feet Assistive device: 4-wheeled walker Gait Pattern/deviations:  Step-through pattern;Trunk flexed;Shuffle     General Gait Details: Encouraged foot clearance and posture but pt did not correct despite cues.   Stairs            Wheelchair Mobility    Modified Rankin (Stroke Patients Only)       Balance Overall balance assessment: Needs assistance           Standing balance-Leahy Scale: Fair Standing balance comment: stood at sink to wash hands with supervision no UE support                    Cognition Arousal/Alertness: Awake/alert Behavior During Therapy: WFL for tasks assessed/performed Overall Cognitive Status: Within Functional Limits for tasks assessed                      Exercises      General Comments        Pertinent Vitals/Pain Pain Assessment: No/denies pain    Home Living                      Prior Function            PT Goals (current goals can now be found in the care plan section) Progress towards PT goals: Not progressing toward goals - comment (increased assist needed for transfers)    Frequency  Min 3X/week    PT Plan Discharge plan needs to be updated    Co-evaluation  End of Session   Activity Tolerance: Patient tolerated treatment well Patient left: in chair;with call bell/phone within reach     Time: 1122-1151 PT Time Calculation (min) (ACUTE ONLY): 29 min  Charges:  $Gait Training: 8-22 mins $Self Care/Home Management: 8-22                    G Codes:      WYNN,CYNDI 06/21/14, 1:29 PM  [name], PT [phone] Jun 21, 2014

## 2014-05-29 NOTE — Progress Notes (Addendum)
[facility] PROGRESS NOTE  Crystalle Leiss WUJ:[id number] DOB: [birth year] DOA: 05/21/2014 PCP: Oneal Grout, MD  Summary Appreciate GI. Shannon [name] is a pleasant 81 year old female with PMH significant forDM2/Hypothyroidism/Essential Htn/Chronic Hyponatremia/ History of gastric outlet obstruction, h/o perforated pyloric / duodenal ulcer she had in the summer last year who presented with abdominal pain, distension, nausea and vomiting and was found to have gastric outlet obstruction. CT abdomen and pelvis showed "Marked gastric distention with stomach filled with fluid and food debris and associated with thickening of the pyloric wall consistent with gastric outlet obstruction, question due to peptic ulcer disease with edema or mass/tumor. Stable low to intermediate attenuation lesions within the liver". She had NG tube place in the ED, yielding 500 cc thick fluids and she is now on full liquids per GI recommendations. Patient has improved on Reglan and there are plans for discharge home on pured diet to follow with GI. She has hyponatremia which is apparently chronic and is managed by salt tablets at home and the salt tablets were resumed on 05/27/14 but sodium decreased to 117 today. I wonder if there is an element of poor oral intake with the full liquids. Repeat sodium this afternoon was 120. I have increased the dose of sodium tablets to 2 g 3 times a day. Will have to see how she does with IV fluids on board. TSH is 16, an improvement from 27 in December 2015. This may be contributing to their hyponatremia. Therefore increased Synthroid to 250 g daily. Will check sodium level this evening and tomorrow morning. Plan Gastric outlet obstruction  On PPI and Reglan  Diet per GI  Follow GI recommendations Hyponatremia  Serum osmolality/urine osmolality/BNP/cortisol level/uric acid level results reviewed  Increase Sodium tablets frequency to 2 tablets 3 times a day  Continue normal  saline DM (diabetes mellitus) type II uncontrolled, periph vascular disorder/Hypothyroidism/Accelerated hypertension  Continue Synthroid at 250 g daily  Continue other medications-increase Toprol-XL to 50 mg daily as blood pressure is higher, likely with an element of increased sodium intake. Code Status: DNR. Family Communication: Daughter [name] over the phone at [phone] . Disposition Plan: Hopefully home soon   Consultants:  GI  Procedures:  None  Antibiotics:  None  HPI/Subjective: Has no complaints. No nausea or vomiting.  Objective: Filed Vitals:   05/29/14 1537  BP: 159/76  Pulse:   Temp:   Resp:     Intake/Output Summary (Last 24 hours) at 05/29/14 1750 Last data filed at 05/29/14 1552  Gross per 24 hour  Intake    810 ml  Output   1825 ml  Net  -1015 ml   Filed Weights   05/22/14 0104 05/27/14 0552  Weight: 60.646 kg (133 lb 11.2 oz) 59.7 kg (131 lb 9.8 oz)    Exam:   General:  Comfortable at rest.  Cardiovascular: S1-S2 normal. No murmurs. Pulse regular.  Respiratory: Good air entry bilaterally. No rhonchi or rales.  Abdomen: Soft and nontender. Normal bowel sounds. No organomegaly.  Musculoskeletal: No pedal edema   Neurological: Intact  Data Reviewed: Basic Metabolic Panel:  Recent Labs Lab 05/26/14 0535 05/27/14 0507 05/28/14 0451 05/29/14 0500 05/29/14 1327  NA 130* 124* 121* 117* 120*  K 3.9 3.9 4.1 4.2 4.3  CL 99 92* 86* 87* 87*  CO2 26 24 24 22 26   GLUCOSE 175* 126* 125* 117* 176*  BUN 5* 5* 10 14 13   CREATININE 0.96 0.88 1.01 0.95 0.84  CALCIUM 9.7 9.5 9.3 8.9  9.2   Liver Function Tests:  Recent Labs Lab 05/28/14 0451  AST 24  ALT 18  ALKPHOS 58  BILITOT 0.6  PROT 6.0  ALBUMIN 3.1*   No results for input(s): LIPASE, AMYLASE in the last 168 hours. No results for input(s): AMMONIA in the last 168 hours. CBC:  Recent Labs Lab 05/23/14 0440 05/25/14 0509 05/27/14 0507 05/28/14 0451 05/29/14 0500   WBC 5.0 4.6 5.5 5.1 5.2  HGB 9.4* 9.6* 10.4* 9.8* 10.3*  HCT 26.7* 27.3* 29.3* 27.2* 28.7*  MCV 87.0 85.3 84.2 82.9 82.5  PLT 237 249 315 310 312   Cardiac Enzymes: No results for input(s): CKTOTAL, CKMB, CKMBINDEX, TROPONINI in the last 168 hours. BNP (last 3 results)  Recent Labs  05/27/14 1630  BNP 195.7*    ProBNP (last 3 results)  Recent Labs  01/17/14 2116  PROBNP 961.6*    CBG:  Recent Labs Lab 05/26/14 1724  GLUCAP 144*    No results found for this or any previous visit (from the past 240 hour(s)).   Studies: No results found.  Scheduled Meds: . amLODipine  10 mg Oral Daily  . budesonide-formoterol  2 puff Inhalation BID  . cloNIDine  0.3 mg Transdermal Weekly  . cycloSPORINE  1 drop Both Eyes BID  . feeding supplement (GLUCERNA SHAKE)  237 mL Oral TID BM  . gabapentin  300 mg Oral TID  . heparin  5,000 Units Subcutaneous 3 times per day  . hydrALAZINE  100 mg Oral TID  . isosorbide mononitrate  30 mg Oral Daily  . latanoprost  1 drop Both Eyes QHS  . levothyroxine  250 mcg Oral QAC breakfast  . levothyroxine  50 mcg Oral Once  . metoCLOPramide  10 mg Oral TID AC & HS  . [START ON 05/30/2014] metoprolol succinate  50 mg Oral Daily  . nitroGLYCERIN  0.2 mg Transdermal Daily  . pantoprazole  40 mg Oral Q0600  . sodium chloride  2 g Oral TID WC   Continuous Infusions: . sodium chloride 75 mL/hr at 05/29/14 7829     Time spent: 25 minutes    Nigil Braman  [facility] Pager [phone]. If 7PM-7AM, please contact night-coverage at [email or web address], password [facility] 05/29/2014, 5:50 PM  LOS: 8 days

## 2014-05-29 NOTE — Care Management Note (Signed)
  Page 1 of 1   05/29/2014     2:02:03 PM CARE MANAGEMENT NOTE 05/29/2014  Patient:  Shannon [name], Shannon [name]   Account Number:  [id number]  Date Initiated:  05/26/2014  Documentation initiated by:  Magdalen Spatz  Subjective/Objective Assessment:     Action/Plan:   Anticipated DC Date:     Anticipated DC Plan:           Choice offered to / List presented to:             Status of service:   Medicare Important Message given?  YES (If response is "NO", the following Medicare IM given date fields will be blank) Date Medicare IM given:  05/26/2014 Medicare IM given by:  Magdalen Spatz Date Additional Medicare IM given:  05/29/2014 Additional Medicare IM given by:  Magdalen Spatz  Discharge Disposition:    Per UR Regulation:  Reviewed for med. necessity/level of care/duration of stay  If discussed at [place] of Stay Meetings, dates discussed:   05/26/2014  05/28/2014    Comments:

## 2014-05-30 LAB — BASIC METABOLIC PANEL
Anion gap: 7 (ref 5–15)
BUN: 11 mg/dL (ref 6–23)
CO2: 26 mmol/L (ref 19–32)
Calcium: 9 mg/dL (ref 8.4–10.5)
Chloride: 89 mmol/L — ABNORMAL LOW (ref 96–112)
Creatinine, Ser: 0.86 mg/dL (ref 0.50–1.10)
GFR calc Af Amer: 68 mL/min — ABNORMAL LOW (ref >90)
GFR calc non Af Amer: 59 mL/min — ABNORMAL LOW (ref >90)
Glucose, Bld: 124 mg/dL — ABNORMAL HIGH (ref 70–99)
Potassium: 4.1 mmol/L (ref 3.5–5.1)
Sodium: 122 mmol/L — ABNORMAL LOW (ref 135–145)

## 2014-05-30 LAB — CBC
HCT: 26.8 % — ABNORMAL LOW (ref 36.0–46.0)
Hemoglobin: 9.7 g/dL — ABNORMAL LOW (ref 12.0–15.0)
MCH: 30.1 pg (ref 26.0–34.0)
MCHC: 36.2 g/dL — ABNORMAL HIGH (ref 30.0–36.0)
MCV: 83.2 fL (ref 78.0–100.0)
Platelets: 284 10*3/uL (ref 150–400)
RBC: 3.22 MIL/uL — ABNORMAL LOW (ref 3.87–5.11)
RDW: 15.9 % — ABNORMAL HIGH (ref 11.5–15.5)
WBC: 9.9 10*3/uL (ref 4.0–10.5)

## 2014-05-30 MED ORDER — SODIUM CHLORIDE 1 G PO TABS: 2.0000 g | ORAL_TABLET | Freq: Three times a day (TID) | ORAL | Status: DC

## 2014-05-30 MED ORDER — LEVOTHYROXINE SODIUM 125 MCG PO TABS: 250.0000 ug | ORAL_TABLET | Freq: Every day | ORAL | Status: DC

## 2014-05-30 MED ORDER — GLUCERNA SHAKE PO LIQD: 237.0000 mL | Freq: Three times a day (TID) | ORAL | Status: DC

## 2014-05-30 NOTE — Discharge Summary (Signed)
Shannon [name], is a 81 y.o. female  DOB 11/06/1923  MRN [id number].  Admission date:  05/21/2014  Admitting Physician  Hillary Bow, DO  Discharge Date:  05/30/2014   Primary MD  Oneal Grout, MD  Recommendations for primary care physician for things to follow:  Please follow sodium level in the next week and TSH in 6 weeks to adjust Synthroid accordingly   Admission Diagnosis   Gastric outlet obstruction [K31.1]   Discharge Diagnosis  Gastric outlet obstruction [K31.1]   Principal Problem:   Gastric outlet obstruction Active Problems:   Hyponatremia   Gastric distention   DM (diabetes mellitus) type II uncontrolled, periph vascular disorder   Hypothyroidism   Accelerated hypertension      Hospital Course  Shannon [name] is a pleasant 81 year old female with PMH significant forDM2/Hypothyroidism/Essential Htn/Chronic Hyponatremia/ History of gastric outlet obstruction, h/o perforated pyloric / duodenal ulcer she had in the summer last year who presented with abdominal pain, distension, nausea and vomiting and was found to have gastric outlet obstruction. CT abdomen and pelvis showed "Marked gastric distention with stomach filled with fluid and food debris and associated with thickening of the pyloric wall consistent with gastric outlet obstruction, question due to peptic ulcer disease with edema or mass/tumor. Stable low to intermediate attenuation lesions within the liver". She had NG tube place in the ED, yielding 500 cc thick fluids and she is now on full liquids per GI recommendations. Patient has improved on Reglan and there are plans for discharge home on pured diet to follow with GI. She has hyponatremia which is apparently chronic and is managed by salt tablets at home and the salt tablets were resumed on  05/27/14 but sodium decreased to a low of 117 before going back up to 122 prior to discharge. I wonder if there is an element of poor oral intake with the full liquids. The dose of sodium tablets was increased to 2 g 3 times a day. A persistently low thyroid function may be contributing to hyponatremia. Her TSH is 16, an improvement from 45 in December 2015 but there is still room for optimization. Therefore increased Synthroid to 250 g daily. Poor absorption related to the gastric obstruction may also be contributing to persistently low sodium/thyroid function. Apparently, patient's blood pressure is difficult to control and this will need further follow-up by her primary care. Since his sodium is chronically low, will not seek to aggressively correct it inhouse but will recommend for follow-up in the next week. Meanwhile, would continue sodium tablets at 2 g with meals. I have discussed the care plan with patient and her daughter Shannon [name]. Patient should follow with gastroenterology as scheduled.   Discharge Condition Relatively Stable.  Consults obtained  Gastroenterology  Follow UP Follow-up Information    Follow up with ZEHR, JESSICA D., PA-C On 06/10/2014.   Specialty:  Gastroenterology   Why:  10:30 AM with Dr Rosealee Albee physician assistant.  to follow up stomach issues.    Contact information:  [address] [place] [address] [phone]         Discharge Instructions  and  Discharge Medications  Discharge Instructions    Diet general    Complete by:  As directed   Full liquids/soft diet.     Increase activity slowly    Complete by:  As directed             Medication List    STOP taking these medications        sucralfate 1 G tablet  Commonly known as:  CARAFATE      TAKE these medications        acetaminophen 325 MG tablet  Commonly known as:  TYLENOL  Take 650 mg by mouth every 6 (six) hours as needed for mild pain or fever.     albuterol 108 (90 BASE)  MCG/ACT inhaler  Commonly known as:  PROVENTIL HFA;VENTOLIN HFA  Inhale 2 puffs into the lungs every 6 (six) hours as needed for wheezing or shortness of breath.     amLODipine 10 MG tablet  Commonly known as:  NORVASC  Take 10 mg by mouth daily. For HTN     aspirin EC 81 MG tablet  Take 81 mg by mouth every morning.     atorvastatin 10 MG tablet  Commonly known as:  LIPITOR  TAKE 1 TABLET BY MOUTH EVERY DAY     calcium-vitamin D 500-200 MG-UNIT per tablet  Commonly known as:  OSCAL WITH D  Take 1 tablet by mouth daily with breakfast.     chlorhexidine 0.12 % solution  Commonly known as:  PERIDEX  15 mLs by Mouth Rinse route 2 (two) times daily.     cloNIDine 0.3 mg/24hr patch  Commonly known as:  CATAPRES - Dosed in mg/24 hr  Place 0.3 mg onto the skin once a week.     cycloSPORINE 0.05 % ophthalmic emulsion  Commonly known as:  RESTASIS  Place 1 drop into both eyes 2 (two) times daily.     feeding supplement (GLUCERNA SHAKE) Liqd  Take 237 mLs by mouth 3 (three) times daily between meals.     fluticasone 50 MCG/ACT nasal spray  Commonly known as:  FLONASE  Place 2 sprays into both nostrils daily.     gabapentin 300 MG capsule  Commonly known as:  NEURONTIN  TAKE ONE CAPSULE BY MOUTH 3 TIMES A DAY     hydrALAZINE 100 MG tablet  Commonly known as:  APRESOLINE  TAKE 1 TABLET BY MOUTH 3 TIMES A DAY     isosorbide mononitrate 30 MG 24 hr tablet  Commonly known as:  IMDUR  Take 1 tablet (30 mg total) by mouth daily.     l-methylfolate-B6-B12 3-35-2 MG Tabs  Commonly known as:  METANX  Take 1 tablet by mouth every morning.     latanoprost 0.005 % ophthalmic solution  Commonly known as:  XALATAN  Place 1 drop into the right eye at bedtime.     levothyroxine 125 MCG tablet  Commonly known as:  SYNTHROID, LEVOTHROID  Take 2 tablets (250 mcg total) by mouth daily before breakfast.     lisinopril 10 MG tablet  Commonly known as:  PRINIVIL,ZESTRIL  TAKE 1 TABLET  BY MOUTH EVERY DAY     metoprolol succinate 25 MG 24 hr tablet  Commonly known as:  TOPROL-XL  Take 25 mg by mouth daily.     multivitamin with minerals tablet  Take 1 tablet by mouth  every morning.     nitroGLYCERIN 0.2 mg/hr patch  Commonly known as:  NITRODUR - Dosed in mg/24 hr  APPLY ONE PATCH EVERY AM AND REMOVE AT BEDTIME     pantoprazole 40 MG tablet  Commonly known as:  PROTONIX  Take 1 tablet (40 mg total) by mouth 2 (two) times daily.     polyethylene glycol packet  Commonly known as:  MIRALAX / GLYCOLAX  Take 17 g by mouth daily.     polyvinyl alcohol 1.4 % ophthalmic solution  Commonly known as:  LIQUIFILM TEARS  Place 1 drop into both eyes as needed for dry eyes.     sennosides-docusate sodium 8.6-50 MG tablet  Commonly known as:  SENOKOT-S  Take 1 tablet by mouth 2 (two) times daily.     sertraline 100 MG tablet  Commonly known as:  ZOLOFT  Take 100 mg by mouth every morning. For depression     sodium chloride 1 G tablet  Take 2 tablets (2 g total) by mouth 3 (three) times daily with meals.     SYMBICORT 160-4.5 MCG/ACT inhaler  Generic drug:  budesonide-formoterol  INHALE 2 PUFFS TWICE A DAY     traMADol 50 MG tablet  Commonly known as:  ULTRAM  Take by mouth 2 (two) times daily as needed for moderate pain.        Diet and Activity recommendation: See Discharge Instructions above  Major procedures and Radiology Reports - PLEASE review detailed and final reports for all details, in brief -    Ct Abdomen Pelvis Wo Contrast  05/21/2014   CLINICAL DATA:  Nausea and vomiting for 3 days, abdominal pain and distension, history of multiple hernias, volvulus, cholecystectomy, diverticulitis, stroke, seizure, hyperlipidemia, small bowel obstruction, ventral hernia, chronic kidney disease  EXAM: CT ABDOMEN AND PELVIS WITHOUT CONTRAST  TECHNIQUE: Multidetector CT imaging of the abdomen and pelvis was performed following the standard protocol without IV  contrast. Sagittal and coronal MPR images reconstructed from axial data set.  COMPARISON:  03/12/2014  FINDINGS: Minimal atelectasis at lung bases.  Small pericardial effusion.  Gallbladder surgically absent.  Multiple low-attenuation foci within liver similar to prior exams, some which are present small cyst, additional of which are indeterminate.  No new abnormalities of the liver, spleen, atrophic pancreas, kidneys, or adrenal glands.  Marked gastric distention containing fluid and food debris.  Significant pyloric thickening again identified consistent with gastric outlet obstruction.  This could be due to peptic ulcer disease with associated mural edema or mass/tumor.  RIGHT parasagittal ventral hernia containing a nonobstructed bowel loop.  Large and small bowel loops otherwise unremarkable.  Extensive atherosclerotic disease.  No definite evidence of bowel obstruction or perforation.  Unremarkable bladder, uterus and adnexae.  Bones demineralized with chronic superior endplate compression deformity of T11 unchanged.  IMPRESSION: Marked gastric distention with stomach filled with fluid and food debris and associated with thickening of the pyloric wall consistent with gastric outlet obstruction, question due to peptic ulcer disease with edema or mass/tumor.  Stable low to intermediate attenuation lesions within the liver.  Extensive atherosclerotic disease.   Electronically Signed   By: Ulyses Southward M.D.   On: 05/21/2014 20:43   Dg Abd 1 View  05/23/2014   CLINICAL DATA:  81 year old female with a history of small bowel obstruction. Not currently having pain.  EXAM: ABDOMEN - 1 VIEW  COMPARISON:  Plain film 05/21/2014, CT 05/21/2014  FINDINGS: Interval advancement of the gastric tube, which terminates in the  left abdomen.  No abnormally distended small bowel or colon. Gas extends to the rectum/sigmoid colon. No unexpected calcifications or radiopaque foreign body.  Vascular calcifications.  Surgical staple  line within the left abdomen.  Surgical clips at the inferior liver margin.  IMPRESSION: Interval advancement of the gastric tube, which now terminates in the left abdomen.  Decreased volume of the soft tissue in the mid abdomen, likely after decompression of the stomach which was distended on the most recent CT.  Gas extends through the colon to the rectum/sigmoid colon with no evidence of small bowel or colonic obstruction. Note that the recent CT demonstrated a more proximal gastric outlet obstruction.  Surgical clips at the inferior liver margin and staple line in the left abdomen.  Signed,  [name]. [name], DO  [facility]  [facility]   Electronically Signed   By: [name]   On: 05/23/2014 07:57   Dg Abd Portable 1v  05/21/2014   CLINICAL DATA:  NG tube placement.  EXAM: PORTABLE ABDOMEN - 1 VIEW  COMPARISON:  None.  FINDINGS: NG tube is in place the side port above the gastroesophageal junction. Recommend advancement 7 cm.  IMPRESSION: As above.   Electronically Signed   By: Drusilla Kanner M.D.   On: 05/21/2014 22:30    Micro Results   No results found for this or any previous visit (from the past 240 hour(s)).     Today   Subjective:   Shannon [name] today has no headache,no chest abdominal pain,no new weakness tingling or numbness, feels much better wants to go home today.   Objective:   Blood pressure 180/69, pulse 77, temperature 99 F (37.2 C), temperature source Oral, resp. rate 17, height 5' 5.5" (1.664 m), weight 59.7 kg (131 lb 9.8 oz), SpO2 99 %.   Intake/Output Summary (Last 24 hours) at 05/30/14 1129 Last data filed at 05/30/14 1013  Gross per 24 hour  Intake 3371.41 ml  Output   1800 ml  Net 1571.41 ml    Exam Awake Alert, Oriented x 3, No new F.N deficits, Normal affect [place].AT,PERRAL Supple Neck,No JVD, No cervical lymphadenopathy appriciated.  Symmetrical Chest wall movement, Good air movement  bilaterally, CTAB RRR,No Gallops,Rubs or new Murmurs, No Parasternal Heave +ve B.Sounds, Abd Soft, Non tender, No organomegaly appriciated, No rebound -guarding or rigidity. No Cyanosis, Clubbing or edema, No new Rash or bruise  Data Review   CBC w Diff: Lab Results  Component Value Date   WBC 9.9 05/30/2014   WBC 5.5 03/31/2014   HGB 9.7* 05/30/2014   HCT 26.8* 05/30/2014   PLT 284 05/30/2014   LYMPHOPCT 14 05/21/2014   MONOPCT 10 05/21/2014   EOSPCT 1 05/21/2014   BASOPCT 0 05/21/2014    CMP: Lab Results  Component Value Date   NA 122* 05/30/2014   NA 133* 05/06/2014   K 4.1 05/30/2014   CL 89* 05/30/2014   CO2 26 05/30/2014   BUN 11 05/30/2014   BUN 30* 05/06/2014   CREATININE 0.86 05/30/2014   CREATININE 1.3* 05/06/2014   GLU 126 05/06/2014   PROT 6.0 05/28/2014   PROT 6.6 02/03/2014   ALBUMIN 3.1* 05/28/2014   BILITOT 0.6 05/28/2014   ALKPHOS 58 05/28/2014   AST 24 05/28/2014   ALT 18 05/28/2014  .   Total Time in preparing paper work, data evaluation and todays exam - 35 minutes  Shannon [name] M.D on 05/30/2014 at 11:29 AM  [facility]  Office  [phone]

## 2014-05-30 NOTE — Progress Notes (Signed)
Discharge instructions gone over with patient and daughter. Home medications gone over. Follow up appointment is made. Diet and activity discussed. Bowel regimen discussed. There were not questions at this time. Patient was discharged.

## 2014-06-02 ENCOUNTER — Encounter: Payer: Self-pay | Admitting: Internal Medicine

## 2014-06-02 ENCOUNTER — Ambulatory Visit (INDEPENDENT_AMBULATORY_CARE_PROVIDER_SITE_OTHER): Payer: Medicare Other | Admitting: Internal Medicine

## 2014-06-02 VITALS — BP 140/70 | HR 64 | Temp 97.9°F | Ht 65.5 in | Wt 135.0 lb

## 2014-06-02 DIAGNOSIS — I10 Essential (primary) hypertension: Secondary | ICD-10-CM

## 2014-06-02 DIAGNOSIS — K311 Adult hypertrophic pyloric stenosis: Secondary | ICD-10-CM | POA: Diagnosis not present

## 2014-06-02 DIAGNOSIS — I248 Other forms of acute ischemic heart disease: Secondary | ICD-10-CM | POA: Diagnosis not present

## 2014-06-02 DIAGNOSIS — E039 Hypothyroidism, unspecified: Secondary | ICD-10-CM | POA: Diagnosis not present

## 2014-06-02 DIAGNOSIS — D638 Anemia in other chronic diseases classified elsewhere: Secondary | ICD-10-CM

## 2014-06-02 DIAGNOSIS — K219 Gastro-esophageal reflux disease without esophagitis: Secondary | ICD-10-CM

## 2014-06-02 DIAGNOSIS — E871 Hypo-osmolality and hyponatremia: Secondary | ICD-10-CM | POA: Diagnosis not present

## 2014-06-02 MED ORDER — POLYVINYL ALCOHOL 1.4 % OP SOLN: 1.0000 [drp] | Freq: Four times a day (QID) | OPHTHALMIC | Status: DC

## 2014-06-02 MED ORDER — LATANOPROST 0.005 % OP SOLN: 1.0000 [drp] | Freq: Every day | OPHTHALMIC | Status: DC

## 2014-06-02 MED ORDER — LACTULOSE 10 GM/15ML PO SOLN: 10.0000 g | Freq: Every day | ORAL | Status: DC | PRN

## 2014-06-02 NOTE — Progress Notes (Signed)
Patient ID: Shannon [name], female   DOB: 01-Feb-1924, 81 y.o.   MRN: [id number]    Chief Complaint  Patient presents with  . Hospitalization Follow-up    Patient was seen in hospital on 05/21/14 for gastric outlet obstruction   . Medication Management    Discuss sodium chloride tablet and thyroid medication (recently increased)   No Known Allergies   HPI 81 y/o female pt is here for post hospital follow up with her daughter. She was in the hospital from 05/21/14-05/30/14 with gastric outlet obstruction and hyponatremia. She required ng tube and was downgraded to pureed diet until outpatient gi follow up. Her salt tablet was increased and thyroid medication dose adjusted.  She has medical history of DM2, HTN, hypothyroidism, chronic hyponatremia among others.  She denies any concerns today. Has been tolerating pureed diet well. Denies nausea or vomiting  ROS Constitutional: Negative for fever, chills,diaphoresis.  HENT: Negative for congestion  Eyes: Negative for eye pain, blurred vision, double vision and discharge.  Respiratory: Negative for cough, shortness of breath and wheezing.   Cardiovascular: Negative for chest pain, palpitations, leg swelling.  Gastrointestinal: Negative for heartburn, nausea, vomiting, abdominal pain. Genitourinary: Negative for dysuria, flank pain.  Musculoskeletal: Negative for back pain, falls Skin: Negative for itching, rash.  Neurological: Negative for dizziness, tingling, focal weakness and headaches.  Psychiatric/Behavioral: Negative for depression   Past Medical History  Diagnosis Date  . Pneumonia   . Thyroid disease   . Seizures   . Stroke   . Hyperlipidemia   . Hypertension   . Diabetes mellitus without complication   . Small bowel obstruction   . Ventral hernia with bowel obstruction    Medication reviewed. See [facility]  Physical exam BP 140/70 mmHg  Pulse 64  Temp(Src) 97.9 F (36.6 C) (Oral)  Ht 5' 5.5" (1.664 m)  Wt 135 lb (61.236 kg)   BMI 22.12 kg/m2  SpO2 96%  General- elderly female in no acute distress Head- atraumatic, normocephalic Eyes- PERRLA, EOMI, no pallor, no icterus, no discharge Neck- no cervical lymphadenopathy Throat- moist mucus membrane Cardiovascular- normal s1,s2, no murmurs, palpable dorsalis pedis Respiratory- bilateral clear to auscultation, no wheeze, no rhonchi, no crackles, no use of accessory muscles Abdomen- bowel sounds present, soft, non tender Musculoskeletal- able to move all 4 extremities, no leg edema, generalized weakness Neurological- no focal deficit Skin- warm and dry Psychiatry- alert and oriented, normal mood and affect  Wt Readings from Last 3 Encounters:  06/02/14 135 lb (61.236 kg)  05/27/14 131 lb 9.8 oz (59.7 kg)  04/29/14 142 lb (64.411 kg)   Lab Results  Component Value Date   TSH 16.195* 05/28/2014   CMP Latest Ref Rng 05/30/2014 05/29/2014 05/29/2014  Glucose 70 - 99 mg/dL 347(Q) 259(D) 638(V)  BUN 6 - 23 mg/dL 11 13 13   Creatinine 0.50 - 1.10 mg/dL 5.64 3.32 9.51  Sodium 135 - 145 mmol/L 122(L) 120(L) 120(L)  Potassium 3.5 - 5.1 mmol/L 4.1 4.2 4.3  Chloride 96 - 112 mmol/L 89(L) 88(L) 87(L)  CO2 19 - 32 mmol/L 26 22 26   Calcium 8.4 - 10.5 mg/dL 9.0 9.0 9.2  Total Protein 6.0 - 8.3 g/dL - - -  Albumin 3.5 - 4.7 g/dL - - -  Total Bilirubin 0.3 - 1.2 mg/dL - - -  Alkaline Phos 39 - 117 U/L - - -  AST 0 - 37 U/L - - -  ALT 0 - 35 U/L - - -     Assessment/plan  1. Hyponatremia Continue sodium tablet 2 g tid with meals, check bmp. Alert and oriented - Basic Metabolic Panel  2. Anemia of chronic disease Monitor cbc - CBC with Differential  3. Hypothyroidism, unspecified hypothyroidism type Continue levothyroxine 250 mcg daily for now and recheck tsh in few weeks. Lab ordered - T4, Free; Future  4. Gastric outlet obstruction S/p NG tube insertion and decompression. Continue pureed diet  5. gerd Controlled symptom, continue pantoprazole 40 mg  bid  6. HTN bp stable. Continue lisinopril 10 mg daily, toprol xl 25 mg daily, norvasc 10 mg daily, imdur 30 mg daily, hydralazine 100 mg tid and clonidine 0.3 mg weekly patch.

## 2014-06-03 ENCOUNTER — Telehealth: Payer: Self-pay

## 2014-06-03 DIAGNOSIS — N289 Disorder of kidney and ureter, unspecified: Secondary | ICD-10-CM

## 2014-06-03 DIAGNOSIS — E875 Hyperkalemia: Secondary | ICD-10-CM

## 2014-06-03 LAB — CBC WITH DIFFERENTIAL/PLATELET
Basophils Absolute: 0.1 10*3/uL (ref 0.0–0.2)
Basos: 1 %
Eos: 3 %
Eosinophils Absolute: 0.2 10*3/uL (ref 0.0–0.4)
HCT: 29.2 % — ABNORMAL LOW (ref 34.0–46.6)
Hemoglobin: 9.4 g/dL — ABNORMAL LOW (ref 11.1–15.9)
Immature Grans (Abs): 0 10*3/uL (ref 0.0–0.1)
Immature Granulocytes: 0 %
Lymphocytes Absolute: 1.2 10*3/uL (ref 0.7–3.1)
Lymphs: 23 %
MCH: 29.9 pg (ref 26.6–33.0)
MCHC: 32.2 g/dL (ref 31.5–35.7)
MCV: 93 fL (ref 79–97)
Monocytes Absolute: 0.7 10*3/uL (ref 0.1–0.9)
Monocytes: 13 %
Neutrophils Absolute: 3.2 10*3/uL (ref 1.4–7.0)
Neutrophils Relative %: 60 %
Platelets: 397 10*3/uL — ABNORMAL HIGH (ref 150–379)
RBC: 3.14 x10E6/uL — ABNORMAL LOW (ref 3.77–5.28)
RDW: 16.7 % — ABNORMAL HIGH (ref 12.3–15.4)
WBC: 5.3 10*3/uL (ref 3.4–10.8)

## 2014-06-03 LAB — BASIC METABOLIC PANEL
BUN/Creatinine Ratio: 14 (ref 11–26)
BUN: 17 mg/dL (ref 8–27)
CO2: 22 mmol/L (ref 18–29)
Calcium: 10.1 mg/dL (ref 8.7–10.3)
Chloride: 90 mmol/L — ABNORMAL LOW (ref 97–108)
Creatinine, Ser: 1.24 mg/dL — ABNORMAL HIGH (ref 0.57–1.00)
GFR calc Af Amer: 45 mL/min/{1.73_m2} — ABNORMAL LOW (ref >59)
GFR calc non Af Amer: 39 mL/min/{1.73_m2} — ABNORMAL LOW (ref >59)
Glucose: 127 mg/dL — ABNORMAL HIGH (ref 65–99)
Potassium: 5.5 mmol/L — ABNORMAL HIGH (ref 3.5–5.2)
Sodium: 125 mmol/L — ABNORMAL LOW (ref 134–144)

## 2014-06-03 NOTE — Telephone Encounter (Signed)
Message left on triage voicemail: Patient's daughter called requesting lab results.   I called [name] and informed her, I will send request to Dr.Pandey and as soon as she response me or one of the other medical assistants will call with instructions/results

## 2014-06-03 NOTE — Telephone Encounter (Signed)
Sodium level has improved - continue salt tablet.   High potassium level and impaired kidney function. Stop lisinopril for now. Also to give kaexylate 15 gm/60 ml  Daily x 2 days, check bmp 06/08/14.  If patient has any chest pain, racing of the heart to call office right away.  Low hemoglobin but stable- not much changed from the hospital lab- will monitor for now

## 2014-06-04 MED ORDER — SODIUM POLYSTYRENE SULFONATE 15 GM/60ML PO SUSP: 15.0000 g | Freq: Once | ORAL | Status: DC

## 2014-06-04 NOTE — Telephone Encounter (Signed)
Her recent low sodium, hospital admission, thryoid problem and other medical problems along with being on several medication could be contributing to this. Is she getting any physical therapy. i would advise, taking current medication and have her work with therapy team for now. Have home health referral for PT please. thanks

## 2014-06-04 NOTE — Telephone Encounter (Signed)
Spoke with patients daughter (discussed labs), Shannon [name] verbalized understanding of labs. RX sent to pharmacy on file. Scheduled lab appointment for 06/09/14 (placed future order)  Patients daughter states patient is very fatigued and her energy level is low, please give advice or recommendations  Pending appointment 08/05/2014 with Dr.Carter

## 2014-06-05 ENCOUNTER — Other Ambulatory Visit: Payer: Self-pay | Admitting: Internal Medicine

## 2014-06-05 MED ORDER — SODIUM CHLORIDE 1 G PO TABS: 2.0000 g | ORAL_TABLET | Freq: Three times a day (TID) | ORAL | Status: DC

## 2014-06-05 MED ORDER — SODIUM POLYSTYRENE SULFONATE 15 GM/60ML PO SUSP: 15.0000 g | Freq: Once | ORAL | Status: DC

## 2014-06-05 NOTE — Telephone Encounter (Signed)
Spoke with Shannon [name] will discuss Dr.Pandeys recommendation of Home Health with her mother and call back is she wishes to pursue

## 2014-06-05 NOTE — Telephone Encounter (Signed)
Patient only received Potassium medication enough for only 1 day. Faxed the 2nd day.

## 2014-06-09 ENCOUNTER — Other Ambulatory Visit: Payer: Medicare Other

## 2014-06-09 ENCOUNTER — Other Ambulatory Visit: Payer: Self-pay | Admitting: Internal Medicine

## 2014-06-09 DIAGNOSIS — N289 Disorder of kidney and ureter, unspecified: Secondary | ICD-10-CM

## 2014-06-09 DIAGNOSIS — E875 Hyperkalemia: Secondary | ICD-10-CM | POA: Diagnosis not present

## 2014-06-10 ENCOUNTER — Encounter: Payer: Self-pay | Admitting: Gastroenterology

## 2014-06-10 ENCOUNTER — Ambulatory Visit (INDEPENDENT_AMBULATORY_CARE_PROVIDER_SITE_OTHER): Payer: Medicare Other | Admitting: Gastroenterology

## 2014-06-10 VITALS — BP 122/58 | HR 60 | Ht 66.0 in | Wt 137.4 lb

## 2014-06-10 DIAGNOSIS — I248 Other forms of acute ischemic heart disease: Secondary | ICD-10-CM

## 2014-06-10 DIAGNOSIS — K311 Adult hypertrophic pyloric stenosis: Secondary | ICD-10-CM | POA: Diagnosis not present

## 2014-06-10 LAB — BASIC METABOLIC PANEL
BUN/Creatinine Ratio: 20 (ref 11–26)
BUN: 24 mg/dL (ref 8–27)
CO2: 26 mmol/L (ref 18–29)
Calcium: 9.9 mg/dL (ref 8.7–10.3)
Chloride: 97 mmol/L (ref 97–108)
Creatinine, Ser: 1.21 mg/dL — ABNORMAL HIGH (ref 0.57–1.00)
GFR calc Af Amer: 46 mL/min/{1.73_m2} — ABNORMAL LOW (ref >59)
GFR calc non Af Amer: 40 mL/min/{1.73_m2} — ABNORMAL LOW (ref >59)
Glucose: 108 mg/dL — ABNORMAL HIGH (ref 65–99)
Potassium: 5.1 mmol/L (ref 3.5–5.2)
Sodium: 134 mmol/L (ref 134–144)

## 2014-06-10 NOTE — Progress Notes (Signed)
06/10/2014 Shannon [name] [id number] 1923-12-07   History of Present Illness:  This is a pleasant 81 year old female who is here today with her daughter for hospital follow-up of GOO.  Was discharged from the hospital last on 05/30/2014.  GI history as follows:  Status post remote gastro-jejunostomy and suspected vagotomy.  S/p 09/2013 G-J anastomosis resection and G-J revision after perforation of anastomotic ulcer.  GOO 01/2014 with EGD x 2: #1 with no anatomic outlet obstruction, normal looking pylorus/duodenum.  #2: to break up Bezoar.  CT 03/12/2014: recurrent Bezoar, thickened Pyloric channel. Suspected the distorted anatomy was causing at the least a partial outlet obstruction.  EGD 03/19/14: retained fluid, tortuous/narrowed anatomy at prepylorus/pylorus but no stricture or ulcers.  UGI/SBFT 03/25/14: normal.  05/21/14 CT scan:   Marked gastric distention, stomach filled with food/fluid. Thickened pyloric wall. Stable liver lesions.  Barium swallow 05/22/14: small paraesophageal hernia, barium trickled into duodenum.  No contrast extravasation.   She was discharged with recommendations to follow a full liquid diet with some thin consistency foods.  She was also discharged on Reglan 10 mg ACHS and pantoprazole 40 mg BID.  She tells me that she is now on a pureed diet.  Her daughter says that she has been very cautious as to what she eats exactly and makes sure that it is pureed well.  Also drinking Boost drinks.  She has discontinued the Reglan due to fear of side effects.  She is using Miralax BID and Senokot-S BID to make sure that she is moving her bowels well.  She says that she feels good.  No abdominal pain, abdominal distention, nausea, or vomiting.   Current Medications, Allergies, Past Medical History, Past Surgical History, Family History and Social History were reviewed in [employer] record.   Physical Exam: BP 122/58 mmHg  Pulse 60  Ht 5\' 6"  (1.676 m)  Wt  137 lb 6.4 oz (62.324 kg)  BMI 22.19 kg/m2 General: Well developed black female in no acute distress Head: Normocephalic and atraumatic Eyes:  Sclerae anicteric, conjunctiva pink  Ears: Normal auditory acuity Lungs: Some coarse BS noted B/L. Heart: Regular rate and rhythm Abdomen: Soft, non-distended.  Normal bowel sounds.  Non-tender. Musculoskeletal: Symmetrical with no gross deformities  Extremities: No edema  Neurological: Alert oriented x 4, grossly non-focal Psychological:  Alert and cooperative. Normal mood and affect  Assessment and Recommendations: *Functional GOO:  She is currently doing well on pureed diet.  They have discontinued the Reglan due to the risk of side effects.  Is using Miralax BID and Senokot-S twice daily to make sure she is moving her bowel well.  Feels well at this point.  Will continue with the same.  Will not add the Reglan back for now, but if she becomes sick again then she may need to be encouraged to continue it even if it is at a lower dose of 5 mg ACHS instead of 10 mg.  Continue pantoprazole 40 mg BID.

## 2014-06-10 NOTE — Patient Instructions (Addendum)
Please follow up as need and continue pureed diet

## 2014-06-10 NOTE — Progress Notes (Signed)
Agree with assessment and plans 

## 2014-06-12 ENCOUNTER — Other Ambulatory Visit: Payer: Self-pay | Admitting: *Deleted

## 2014-06-12 MED ORDER — SODIUM CHLORIDE 1 G PO TABS
2.0000 g | ORAL_TABLET | Freq: Three times a day (TID) | ORAL | Status: DC
Start: 2014-06-12 — End: 2014-11-06

## 2014-06-12 MED ORDER — LEVOTHYROXINE SODIUM 125 MCG PO TABS: 250.0000 ug | ORAL_TABLET | Freq: Every day | ORAL | Status: DC

## 2014-06-12 NOTE — Telephone Encounter (Signed)
Shannon [name] Requested to be faxed to pharmacy.

## 2014-06-15 DIAGNOSIS — E1142 Type 2 diabetes mellitus with diabetic polyneuropathy: Secondary | ICD-10-CM | POA: Diagnosis not present

## 2014-06-15 DIAGNOSIS — I8312 Varicose veins of left lower extremity with inflammation: Secondary | ICD-10-CM | POA: Diagnosis not present

## 2014-06-15 DIAGNOSIS — M2041 Other hammer toe(s) (acquired), right foot: Secondary | ICD-10-CM | POA: Diagnosis not present

## 2014-06-15 DIAGNOSIS — I8311 Varicose veins of right lower extremity with inflammation: Secondary | ICD-10-CM | POA: Diagnosis not present

## 2014-06-15 DIAGNOSIS — M79674 Pain in right toe(s): Secondary | ICD-10-CM | POA: Diagnosis not present

## 2014-06-15 DIAGNOSIS — R269 Unspecified abnormalities of gait and mobility: Secondary | ICD-10-CM | POA: Diagnosis not present

## 2014-06-15 DIAGNOSIS — R609 Edema, unspecified: Secondary | ICD-10-CM | POA: Diagnosis not present

## 2014-06-22 ENCOUNTER — Other Ambulatory Visit: Payer: Self-pay | Admitting: Internal Medicine

## 2014-06-24 ENCOUNTER — Telehealth: Payer: Self-pay | Admitting: Cardiology

## 2014-06-24 ENCOUNTER — Telehealth: Payer: Self-pay | Admitting: *Deleted

## 2014-06-24 NOTE — Telephone Encounter (Signed)
Patient's daughter wants too know if they can use the Advair, now that they have ran out of Symbicort and when the Advair is done can they change back to the Symbicort. She also states that her mom has been coughing a lot at night , so they have been using mucinex dm and zyrtec. Please advise!

## 2014-06-24 NOTE — Telephone Encounter (Signed)
Ok to use advair for now. Ok to use mucinex dm and zyrtec for now, but if no improvement by end of the week, will need to come to office for evaluation

## 2014-06-24 NOTE — Telephone Encounter (Signed)
Pts daughter calling to ask if the pt can switch back to taking Advair inhaler, instead of the Symbicort inhaler due to cost effective reasons.  Per the pts daughter, at last OV with Dr Meda Coffee, she ordered for the pt to use Advair.  Per the daughter the pts PCP switched her to Symbicort.  Daughter asked if Dr Meda Coffee could advise on the switch back to Advair and d/c the Symbicort due to the cost of the inhaler will run the pt $80 per month.  Advised the daughter that she should contact the pts PCP to further advise on this, being he d/c the Advair and started her on th Symbicort.  Informed her that the ordering MD usually is the one who advises on a discontinuation of a med they ordered.  Daughter verbalized understanding and agrees with this plan.

## 2014-06-24 NOTE — Telephone Encounter (Signed)
New message      Pt c/o medication issue:  1. Name of Medication: symbicort and advair????  2. How are you currently taking this medication (dosage and times per day)  3. Are you having a reaction (difficulty breathing--STAT)?no 4. What is your medication issue?  Can she take both medications?

## 2014-06-25 NOTE — Telephone Encounter (Signed)
[name] Notified and agreed.  

## 2014-07-08 ENCOUNTER — Ambulatory Visit: Payer: Medicare Other | Admitting: Cardiology

## 2014-07-08 ENCOUNTER — Telehealth: Payer: Self-pay | Admitting: *Deleted

## 2014-07-08 NOTE — Telephone Encounter (Signed)
Daughter called regarding her mother having an allergic reaction to the clonidine (Catapres) patch, she stated that when they removed the patch it left a dark area and caused itching. After speaking with Dr. Eulas Post, she wanted the patient to clean the area well and apply hydrocortisone for itching. The daughter stated that she would do this and will give the office a call if their is anymore issues.

## 2014-07-13 ENCOUNTER — Encounter: Payer: Self-pay | Admitting: Cardiology

## 2014-07-13 ENCOUNTER — Ambulatory Visit (INDEPENDENT_AMBULATORY_CARE_PROVIDER_SITE_OTHER): Payer: Medicare Other | Admitting: Cardiology

## 2014-07-13 VITALS — BP 128/58 | HR 55 | Ht 66.0 in | Wt 134.6 lb

## 2014-07-13 DIAGNOSIS — I248 Other forms of acute ischemic heart disease: Secondary | ICD-10-CM | POA: Diagnosis not present

## 2014-07-13 DIAGNOSIS — I10 Essential (primary) hypertension: Secondary | ICD-10-CM | POA: Diagnosis not present

## 2014-07-13 MED ORDER — CLONIDINE HCL 0.1 MG PO TABS: 0.1000 mg | ORAL_TABLET | Freq: Two times a day (BID) | ORAL | Status: DC

## 2014-07-13 NOTE — Patient Instructions (Addendum)
Your physician has recommended you make the following change in your medication:  1) STOP Clonidine Patch 2) START Clonidine 0.1mg  tablet twice a day - can start the day after you take the patch off   Your physician wants you to follow-up in: 6 months with Dr. Meda Coffee. You will receive a reminder letter in the mail two months in advance. If you don't receive a letter, please call our office to schedule the follow-up appointment.

## 2014-07-13 NOTE — Progress Notes (Signed)
Patient ID: Merav Burchill, female   DOB: 1923-10-29, 81 y.o.   MRN: [id number]    Patient Name: Shannon [name] Date of Encounter: 07/13/2014  Primary Care Provider:  Kirt Boys, DO Primary Cardiologist:  Lars Masson  Problem List   Past Medical History  Diagnosis Date  . Pneumonia   . Thyroid disease   . Seizures   . Stroke   . Hyperlipidemia   . Hypertension   . Diabetes mellitus without complication   . Small bowel obstruction   . Ventral hernia with bowel obstruction    Past Surgical History  Procedure Laterality Date  . Exploratory laparotomy  09-07-2013  . Cholecystectomy    . Tubal ligation    . Colectomy    . Hernia repair  2015  . Esophagogastroduodenoscopy N/A 01/19/2014    Procedure: ESOPHAGOGASTRODUODENOSCOPY (EGD);  Surgeon: Hilarie Fredrickson, MD;  Location: [facility];  Service: Endoscopy;  Laterality: N/A;  . Esophagogastroduodenoscopy N/A 01/20/2014    Procedure: ESOPHAGOGASTRODUODENOSCOPY (EGD);  Surgeon: Hilarie Fredrickson, MD;  Location: [facility];  Service: Endoscopy;  Laterality: N/A;  . Esophagogastroduodenoscopy N/A 03/19/2014    Procedure: ESOPHAGOGASTRODUODENOSCOPY (EGD);  Surgeon: Rachael Fee, MD;  Location: [facility];  Service: Endoscopy;  Laterality: N/A;   Allergies  No Known Allergies  Chief complain: Hypertension  HPI  81 year old female who is coming for post-hospitalization follow up, she has h/o diabetes, chronic kidney disease stage III with recent exploratory laparotomy with EGD and resection of gastro-jejunal anastomosis, with current difficult to control hypertension and chest pain. Troponin elevated in the hospital in the settings of BP in the 190-200 systolic range. She has been transferred to the ICU. Metoprolol 5 mg IV has been administered, clonidine 0.3 he has been administered. Hydralazine 10 mg IV has been administered. We were asked for suggestions on reducing blood pressure. Cardiology was consulted and it was believed  that troponin elevation is sec to demand ischemia in the settings of hypertensive emergency. She also has h/o pre-syncope, MRI/MRA head neck showed chronic ischemic changes, no acute stroke and ? Left ICA stenosis, follow up US showed onlt 1-39% stenosis B/L.  02/18/2014 - The patient is coming for a follow up, she complains of overall fatigue and DOE, no chest pain. BP at home in the 150-160 range.   04/08/2013 - the patient was hospitalized at the end on December for another episode of SBO and hyponatremia. She is now in rehab. She had a negative stress test at the end of November with normal heart function and no prior scar or ischemia. She feels ok, complains of cough when she lays down at night. No SOB or chest pain.  07/13/2014 - the patient is doing great and feeling better since the last visit, decreasing Imdur helped, also her daughter monitors her BP closely and if too low in the evening she holds her meds. Her complain today is significant itching and burning with clonidine patches with hyperpigmentation.    Home Medications  Prior to Admission medications   Medication Sig Start Date End Date Taking? Authorizing Provider  acetaminophen (TYLENOL EX ST ARTHRITIS PAIN) 500 MG tablet Take 500 mg by mouth every 6 (six) hours as needed (for arthritis pain).   Yes Historical Provider, MD  antiseptic oral rinse (CPC / CETYLPYRIDINIUM CHLORIDE 0.05%) 0.05 % LIQD solution 7 mLs by Mouth Rinse route 2 times daily at 12 noon and 4 pm. 01/22/14  Yes Alison Murray, MD  aspirin EC 81 MG tablet  Take 1 tablet (81 mg total) by mouth daily. 12/26/13  Yes Rodolph Bong, MD  atorvastatin (LIPITOR) 10 MG tablet Take 10 mg by mouth daily at 6 PM.   Yes Historical Provider, MD  Calcium Carbonate (CALTRATE 600 PO) Take 2 capsules by mouth 2 (two) times daily.   Yes Historical Provider, MD  chlorhexidine (PERIDEX) 0.12 % solution 15 mLs by Mouth Rinse route 2 (two) times daily. 01/22/14  Yes Alison Murray, MD    cycloSPORINE (RESTASIS) 0.05 % ophthalmic emulsion Place 1 drop into both eyes 2 (two) times daily.   Yes Historical Provider, MD  Dietary Management Product (VASCULERA) TABS Take 1 tablet by mouth daily.   Yes Historical Provider, MD  fluticasone (FLONASE) 50 MCG/ACT nasal spray Place 2 sprays into both nostrils daily.   Yes Historical Provider, MD  gabapentin (NEURONTIN) 300 MG capsule Take 300 mg by mouth 3 (three) times daily.   Yes Historical Provider, MD  hydrALAZINE (APRESOLINE) 50 MG tablet Take one and half tablet by mouth three times daily to control blood pressure 02/06/14  Yes Tiffany L Reed, DO  isosorbide mononitrate (IMDUR) 30 MG 24 hr tablet Take 30 mg by mouth daily.   Yes Historical Provider, MD  l-methylfolate-B6-B12 Latrelle Dodrill) 3-35-2 MG TABS Take one tablet by mouth once daily 01/09/14  Yes Mahima Pandey, MD  levothyroxine (SYNTHROID, LEVOTHROID) 175 MCG tablet Take 175 mcg by mouth daily before breakfast.   Yes Historical Provider, MD  losartan (COZAAR) 100 MG tablet Take 100 mg by mouth daily.   Yes Historical Provider, MD  magnesium oxide (MAG-OX) 400 (241.3 MG) MG tablet TAKE ONE TABLET BY MOUTH TWICE DAILY 02/10/14  Yes Mahima Pandey, MD  metoprolol succinate (TOPROL-XL) 50 MG 24 hr tablet Take 50 mg by mouth daily. Take with or immediately following a meal.   Yes Historical Provider, MD  Multiple Vitamins-Minerals (MULTIVITAMIN WITH MINERALS) tablet Take 1 tablet by mouth daily.   Yes Historical Provider, MD  pantoprazole (PROTONIX) 40 MG tablet Take 40 mg by mouth 2 (two) times daily before a meal. 02/03/14  Yes Historical Provider, MD  polyethylene glycol powder (GLYCOLAX/MIRALAX) powder Take 17 g by mouth 2 (two) times daily as needed. 02/03/14  Yes Mahima Glade Lloyd, MD  sennosides-docusate sodium (SENOKOT-S) 8.6-50 MG tablet Take 1 tablet by mouth 2 (two) times daily.    Yes Historical Provider, MD  sertraline (ZOLOFT) 100 MG tablet Take 100 mg by mouth daily.   Yes Historical  Provider, MD  sodium chloride 1 G tablet Take 1 g by mouth 2 (two) times daily with a meal.  02/05/14  Yes Historical Provider, MD  sucralfate (CARAFATE) 1 G tablet Take 1 g by mouth 3 (three) times daily with meals.   Yes Historical Provider, MD  traMADol (ULTRAM) 50 MG tablet Take 1 tablet (50 mg total) by mouth every 12 (twelve) hours as needed for severe pain (pain). 01/22/14  Yes Alison Murray, MD   Family History  Family History  Problem Relation Age of Onset  . Stroke Mother   . Heart attack Neg Hx   . Hypertension Mother   . Diabetes Brother     Social History  History   Social History  . Marital Status: Married    Spouse Name: N/A  . Number of Children: N/A  . Years of Education: N/A   Occupational History  . Not on file.   Social History Main Topics  . Smoking status: Never Smoker   .  Smokeless tobacco: Not on file  . Alcohol Use: No  . Drug Use: No  . Sexual Activity: Not on file   Other Topics Concern  . Not on file   Social History Narrative   Married in [place], lives in one story house with 2 people and no pets   Occupation: ?   Has a living Will, [place], and doesn't have DNR   Was living with Husband (in frail health) in [place] until 09/2013.  After her [facility] admission they both came to live with daughter in [place]     Review of Systems, as per HPI, otherwise negative General:  No chills, fever, night sweats or weight changes.  Cardiovascular:  No chest pain, dyspnea on exertion, edema, orthopnea, palpitations, paroxysmal nocturnal dyspnea. Dermatological: No rash, lesions/masses Respiratory: No cough, dyspnea Urologic: No hematuria, dysuria Abdominal:   No nausea, vomiting, diarrhea, bright red blood per rectum, melena, or hematemesis Neurologic:  No visual changes, wkns, changes in mental status. All other systems reviewed and are otherwise negative except as noted above.  Physical Exam  Blood pressure 128/58, pulse 55, height 5\' 6"  (1.676  m), weight 134 lb 9.6 oz (61.054 kg), SpO2 99 %.  General: Pleasant, NAD Psych: Normal affect. Neuro: Alert and oriented X 3. Moves all extremities spontaneously. HEENT: Normal  Thorax: square hyperpigmentations post clonidine patches Neck: Supple without bruits or JVD. Lungs:  Resp regular and unlabored, CTA. Heart: RRR no s3, s4, or murmurs. Abdomen: Soft, non-tender, non-distended, BS + x 4.  Extremities: No clubbing, cyanosis, mild B/L LE edema. DP/PT/Radials 2+ and equal bilaterally.  Labs:  No results for input(s): CKTOTAL, CKMB, TROPONINI in the last 72 hours. Lab Results  Component Value Date   WBC 5.3 06/02/2014   HGB 9.4* 06/02/2014   HCT 29.2* 06/02/2014   MCV 93 06/02/2014   PLT 397* 06/02/2014    No results found for: DDIMER Invalid input(s): POCBNP    Component Value Date/Time   NA 134 06/09/2014 1000   NA 122* 05/30/2014 0500   K 5.1 06/09/2014 1000   CL 97 06/09/2014 1000   CO2 26 06/09/2014 1000   GLUCOSE 108* 06/09/2014 1000   GLUCOSE 124* 05/30/2014 0500   BUN 24 06/09/2014 1000   BUN 11 05/30/2014 0500   CREATININE 1.21* 06/09/2014 1000   CREATININE 1.3* 05/06/2014   CALCIUM 9.9 06/09/2014 1000   PROT 6.0 05/28/2014 0451   PROT 6.6 02/03/2014 1113   ALBUMIN 3.1* 05/28/2014 0451   AST 24 05/28/2014 0451   ALT 18 05/28/2014 0451   ALKPHOS 58 05/28/2014 0451   BILITOT 0.6 05/28/2014 0451   GFRNONAA 40* 06/09/2014 1000   GFRAA 46* 06/09/2014 1000   Lab Results  Component Value Date   CHOL 110 12/24/2013   HDL 58 12/24/2013   LDLCALC 26 12/24/2013   TRIG 130 12/24/2013    Accessory Clinical Findings  Echocardiogram -12/24/2013 Left ventricle: The cavity size was mildly dilated. Wall thickness was increased in a pattern of mild LVH. Systolic function was normal. The estimated ejection fraction was in the range of 55% to 60%. - Aortic valve: There was mild to moderate regurgitation. - Mitral valve: There was mild regurgitation. -  Left atrium: The atrium was moderately dilated. - Right atrium: The atrium was mildly dilated. - Atrial septum: Mobile and redundant with no obvious PFO.  ECG - NSR, normal ECG  Sono carotids: 12/25/2013 Summary: Bilateral: mild mixed plaque CCA. Moderate calcific plaque origin and proximal ICA  and ECA. 1-39% ICA stenosis. Vertebral artery flow is antegrade. ICA/CCA ratio: R-1.2 L-1.21.  Other specific details can be found in the table(s) above. Prepared and Electronically Authenticated by  Leonides Sake, MD 2015-09-24T16:13:21        Assessment & Plan  81 year old with bezoar, post op hypertension, transient chest pain, elevated troponin/demand ischemia in the setting of hypertensive emergency.   Principal Problem:  Gastric outlet obstruction Active Problems:  Essential hypertension, benign  GERD (gastroesophageal reflux disease)  Dyslipidemia  DM (diabetes mellitus) type II uncontrolled, periph vascular disorder  Diabetic neuropathy  Hypothyroidism  Depression  Anemia of chronic disease  CKD (chronic kidney disease) stage 3, GFR 30-59 ml/min  Gastric bezoar  1) Demand ischemia - during her recent episode of hypertension, troponin isolated mildly elevated 0.34 with subsequent normal troponin. However  The patient reports fatigue and DOE. Lexiscan nuclear stress test to ruled out ischemia, prior MI, normal LVEF.   2) Hypertension - improved hypotension after decreasing imdur from 60 to 30 mg po daily. I will switch clonidine patch 0.3 mg transdetmal weekly to 0.1 mg PO BID and adjust as needed.   3) Abnormal MRI, the patient and her daughter have a lot of questions regarding discrepancy between MRI and carotid US that didn't show any significant stenosis. She wants to follow with a neurologist for chronic ischemic changes.   4. LE edema - mild, low BP and h/o recent hyponatremia, no diuretics.  Follow up in 6 month.  Lars Masson, MD, [facility] 07/13/2014,  10:47 AM

## 2014-07-14 ENCOUNTER — Encounter: Payer: Self-pay | Admitting: Neurology

## 2014-07-14 ENCOUNTER — Ambulatory Visit (INDEPENDENT_AMBULATORY_CARE_PROVIDER_SITE_OTHER): Payer: Medicare Other | Admitting: Neurology

## 2014-07-14 ENCOUNTER — Other Ambulatory Visit: Payer: Self-pay | Admitting: *Deleted

## 2014-07-14 VITALS — BP 104/60 | HR 70 | Resp 18 | Ht 66.0 in | Wt 132.0 lb

## 2014-07-14 DIAGNOSIS — I248 Other forms of acute ischemic heart disease: Secondary | ICD-10-CM | POA: Diagnosis not present

## 2014-07-14 DIAGNOSIS — E785 Hyperlipidemia, unspecified: Secondary | ICD-10-CM | POA: Diagnosis not present

## 2014-07-14 DIAGNOSIS — E1142 Type 2 diabetes mellitus with diabetic polyneuropathy: Secondary | ICD-10-CM | POA: Diagnosis not present

## 2014-07-14 DIAGNOSIS — I10 Essential (primary) hypertension: Secondary | ICD-10-CM

## 2014-07-14 DIAGNOSIS — Z8673 Personal history of transient ischemic attack (TIA), and cerebral infarction without residual deficits: Secondary | ICD-10-CM | POA: Diagnosis not present

## 2014-07-14 DIAGNOSIS — I679 Cerebrovascular disease, unspecified: Secondary | ICD-10-CM | POA: Diagnosis not present

## 2014-07-14 MED ORDER — SERTRALINE HCL 100 MG PO TABS: ORAL_TABLET | ORAL | Status: DC

## 2014-07-14 NOTE — Progress Notes (Signed)
NEUROLOGY FOLLOW UP OFFICE NOTE  Yelena Penfold UB:[id number]  HISTORY OF PRESENT ILLNESS: Shannon [name] is an 81 year old right-handed woman with hypertension, thyroid disease, hyperlipidemia, type II diabetes mellitus, chronic kidney disease stage III, atrial fibrillation and history of stroke, seizure, and resection of gastro-jejunal anastomosis who follows up for painful diabetic neuropathy, cerebrovascular disease and history of stroke with post-stroke symptomatic seizures.  Records and labs reviewed.  She is accompanied by her daughter who provides some history.  UPDATE: For secondary stroke prevention, she takes ASA 81mg  daily.  She takes Lipitor 10mg  daily.  Blood pressure has been controlled. For neuropathic pain, she takes gabapentin 300mg  three times daily.  Pain is improved.  At night, it seems more painful.  Of note, she was admitted to the hospital in February for gastric outlet obstruction.  HISTORY: She was admitted to [facility] in September with episode of slurred speech, generalized weakness, diaphoresis and lightheadedness.  Specific etiology was unclear. MRI of the brain showed chronic small vessel ischemic changes with remote lacunar infarcts in the cerebellar hemispheres, both thalami and white matter, but no acute infarct seen.  MRA of the head showed no significant intracranial stenosis.  MRA of the neck showed possible significant stenosis at the origin of the right ICA.  However, carotid doppler revealed moderate calcific plaque at the origin and proximal ICA with 1-39% stenosis bilaterally.  2D echo showed LVEF 55-60%.  LDL was 26.  Hgb A1c was 6.9.  Following an EGD with resection of gastro-jejunal anastomosis, she developed hypertensive emergency.  She has had hypertension which has been difficult to control.  She was admitted again in December for encephalopathy.  Systolic blood pressure was in the 200s.  Na was 119.  She was found to have another SBO.  MRI of brain performed  on 03/23/14 was unchanged compared to prior scan in September.  She was treated accordingly.    She has prior history of stroke many years ago, with subsequent seizures.  She was initially on Depakote and was later changed to another agent that her daughter does not remember.  Since she had been seizure-free for many years, it was discontinued some time last year and has been doing well.  She has painful diabetic neuropathy.    PAST MEDICAL HISTORY: Past Medical History  Diagnosis Date  . Pneumonia   . Thyroid disease   . Seizures   . Stroke   . Hyperlipidemia   . Hypertension   . Diabetes mellitus without complication   . Small bowel obstruction   . Ventral hernia with bowel obstruction     MEDICATIONS: Current Outpatient Prescriptions on File Prior to Visit  Medication Sig Dispense Refill  . acetaminophen (TYLENOL) 325 MG tablet Take 650 mg by mouth every 6 (six) hours as needed for mild pain or fever.    [name] albuterol (PROVENTIL HFA;VENTOLIN HFA) 108 (90 BASE) MCG/ACT inhaler Inhale 2 puffs into the lungs every 6 (six) hours as needed for wheezing or shortness of breath. 1 Inhaler 2  . amLODipine (NORVASC) 10 MG tablet Take 10 mg by mouth daily. For HTN    . aspirin EC 81 MG tablet Take 81 mg by mouth every morning.    [name] atorvastatin (LIPITOR) 10 MG tablet TAKE 1 TABLET BY MOUTH EVERY DAY 30 tablet 0  . calcium-vitamin D (OSCAL WITH D) 500-200 MG-UNIT per tablet Take 1 tablet by mouth daily with breakfast.     . chlorhexidine (PERIDEX) 0.12 % solution  15 mLs by Mouth Rinse route 2 (two) times daily. 120 mL 0  . cloNIDine (CATAPRES) 0.1 MG tablet Take 1 tablet (0.1 mg total) by mouth 2 (two) times daily. 60 tablet 11  . cycloSPORINE (RESTASIS) 0.05 % ophthalmic emulsion Place 1 drop into both eyes 2 (two) times daily.    . feeding supplement, GLUCERNA SHAKE, (GLUCERNA SHAKE) LIQD Take 237 mLs by mouth 3 (three) times daily between meals. 90 Can 1  . fluticasone (FLONASE) 50 MCG/ACT  nasal spray Place 2 sprays into both nostrils daily.    . Fluticasone-Salmeterol (ADVAIR DISKUS IN) Inhale into the lungs as needed.    . gabapentin (NEURONTIN) 300 MG capsule TAKE ONE CAPSULE BY MOUTH 3 TIMES A DAY 90 capsule 0  . hydrALAZINE (APRESOLINE) 100 MG tablet TAKE 1 TABLET BY MOUTH 3 TIMES A DAY 90 tablet 5  . isosorbide mononitrate (IMDUR) 30 MG 24 hr tablet Take 1 tablet (30 mg total) by mouth daily. 90 tablet 3  . l-methylfolate-B6-B12 (METANX) 3-35-2 MG TABS Take 1 tablet by mouth every morning.    . lactulose (CHRONULAC) 10 GM/15ML solution Take 15 mLs (10 g total) by mouth daily as needed for mild constipation. 240 mL 0  . latanoprost (XALATAN) 0.005 % ophthalmic solution Place 1 drop into both eyes at bedtime. 2.5 mL 12  . levothyroxine (SYNTHROID, LEVOTHROID) 125 MCG tablet Take 2 tablets (250 mcg total) by mouth daily before breakfast. 60 tablet 1  . metoprolol succinate (TOPROL-XL) 25 MG 24 hr tablet Take 25 mg by mouth daily.    . Multiple Vitamins-Minerals (MULTIVITAMIN WITH MINERALS) tablet Take 1 tablet by mouth every morning.     . nitroGLYCERIN (NITRODUR - DOSED IN MG/24 HR) 0.2 mg/hr patch APPLY ONE PATCH EVERY AM AND REMOVE AT BEDTIME 30 patch 1  . pantoprazole (PROTONIX) 40 MG tablet TAKE 1 TABLET BY MOUTH TWICE A DAY 60 tablet 0  . Polyethyl Glycol-Propyl Glycol 0.4-0.3 % SOLN Apply to eye.    . polyethylene glycol (MIRALAX / GLYCOLAX) packet Take 17 g by mouth 2 (two) times daily.    . sennosides-docusate sodium (SENOKOT-S) 8.6-50 MG tablet Take 1 tablet by mouth 2 (two) times daily.     . sertraline (ZOLOFT) 100 MG tablet Take 100 mg by mouth every morning. For depression    . sodium chloride 1 G tablet Take 2 tablets (2 g total) by mouth 3 (three) times daily with meals. 180 tablet 1  . SYMBICORT 160-4.5 MCG/ACT inhaler INHALE 2 PUFFS TWICE A DAY 10.2 Inhaler 0  . traMADol (ULTRAM) 50 MG tablet Take by mouth 2 (two) times daily as needed for moderate pain.      No current facility-administered medications on file prior to visit.    ALLERGIES: No Known Allergies  FAMILY HISTORY: Family History  Problem Relation Age of Onset  . Stroke Mother   . Heart attack Neg Hx   . Hypertension Mother   . Diabetes Brother     SOCIAL HISTORY: History   Social History  . Marital Status: Married    Spouse Name: N/A  . Number of Children: N/A  . Years of Education: N/A   Occupational History  . Not on file.   Social History Main Topics  . Smoking status: Never Smoker   . Smokeless tobacco: Not on file  . Alcohol Use: No  . Drug Use: No  . Sexual Activity: No   Other Topics Concern  . Not on file  Social History Narrative   Married in [place], lives in one story house with 2 people and no pets   Occupation: ?   Has a living Will, [place], and doesn't have DNR   Was living with Husband (in frail health) in [place] until 09/2013.  After her [facility] admission they both came to live with daughter in [place]: Constitutional: No fevers, chills, or sweats, no generalized fatigue, change in appetite Eyes: No visual changes, double vision, eye pain Ear, nose and throat: No hearing loss, ear pain, nasal congestion, sore throat Cardiovascular: No chest pain, palpitations Respiratory:  No shortness of breath at rest or with exertion, wheezes GastrointestinaI: No nausea, vomiting, diarrhea, abdominal pain, fecal incontinence Genitourinary:  No dysuria, urinary retention or frequency Musculoskeletal:  No neck pain, back pain Integumentary: No rash, pruritus, skin lesions Neurological: as above Psychiatric: No depression, insomnia, anxiety Endocrine: No palpitations, fatigue, diaphoresis, mood swings, change in appetite, change in weight, increased thirst Hematologic/Lymphatic:  No anemia, purpura, petechiae. Allergic/Immunologic: no itchy/runny eyes, nasal congestion, recent allergic reactions, rashes  PHYSICAL EXAM: Filed  Vitals:   07/14/14 1129  BP: 104/60  Pulse: 70  Resp: 18   General: No acute distress Head:  Normocephalic/atraumatic Eyes:  Fundoscopic exam unremarkable without vessel changes, exudates, hemorrhages or papilledema. Neck: supple, no paraspinal tenderness, full range of motion Heart:  Regular rate and rhythm Lungs:  Clear to auscultation bilaterally Back: No paraspinal tenderness Neurological Exam: alert and oriented to person, place, and time, recent and remote memory intact, fund of knowledge intact, attention and concentration intact, speech fluent and not dysarthric, language intact.  CN II-XII intact.  Bulk and tone normal.  Muscle strength 5/5 throughout.  Sensation to light touch intact.  Deep tendon reflexes 1+ throughout except absent in ankles.  .  Finger to nose intact.  Cannot ambulate without walker.  IMPRESSION: Cerebrovascular disease with history of strokes secondary to small vessel disease Painful diabetic neuropathy Type 2 diabetes mellitus Hyperlipidemia Hypertension  PLAN: ASA 81mg  daily for secondary stroke prevention Lipitor 10mg  daily (LDL at goal of less than 70) Continue gabapentin 300mg  three times daily Advised to use capsaicin cream at night.  If ineffective, we can try Voltaren gel. Follow up in 6 months.  Metta Clines, DO  CC:  Gildardo Cranker

## 2014-07-14 NOTE — Patient Instructions (Addendum)
1.  Continue aspirin 81mg  daily 2.  Continue Lipitor daily 3.  Continue gabapentin 300mg  three times daily 4.  At night apply capsaicin cream.  If ineffective, I can prescribe you Voltaren gel 5. Follow up in 6 months.

## 2014-07-14 NOTE — Telephone Encounter (Signed)
Patient daughter, [name] Requested to be faxed to pharmacy. Faxed.

## 2014-07-17 DIAGNOSIS — H04123 Dry eye syndrome of bilateral lacrimal glands: Secondary | ICD-10-CM | POA: Diagnosis not present

## 2014-07-17 DIAGNOSIS — Z961 Presence of intraocular lens: Secondary | ICD-10-CM | POA: Diagnosis not present

## 2014-07-17 DIAGNOSIS — I10 Essential (primary) hypertension: Secondary | ICD-10-CM | POA: Diagnosis not present

## 2014-07-17 DIAGNOSIS — H4011X0 Primary open-angle glaucoma, stage unspecified: Secondary | ICD-10-CM | POA: Diagnosis not present

## 2014-07-17 LAB — HM DIABETES EYE EXAM

## 2014-07-20 ENCOUNTER — Other Ambulatory Visit: Payer: Self-pay | Admitting: Internal Medicine

## 2014-07-21 ENCOUNTER — Telehealth: Payer: Self-pay | Admitting: *Deleted

## 2014-07-21 DIAGNOSIS — I10 Essential (primary) hypertension: Secondary | ICD-10-CM

## 2014-07-21 DIAGNOSIS — I482 Chronic atrial fibrillation, unspecified: Secondary | ICD-10-CM

## 2014-07-21 NOTE — Telephone Encounter (Signed)
D/c patch and monitor for CP. May need to increase imdur to 2 tabs daily

## 2014-07-21 NOTE — Telephone Encounter (Signed)
Patient caregiver, [name] called and stated that the pharmacist told them to talk with you regarding patient taking Nitro Patch and Imdur together. Stated that there was a drug interaction with taking both of them. Please Advise.

## 2014-07-22 MED ORDER — ISOSORBIDE MONONITRATE ER 30 MG PO TB24: ORAL_TABLET | ORAL | Status: DC

## 2014-07-22 NOTE — Telephone Encounter (Signed)
LMOM to return call.

## 2014-07-22 NOTE — Telephone Encounter (Signed)
[name] Notified and agreed.  

## 2014-07-23 ENCOUNTER — Other Ambulatory Visit: Payer: Self-pay | Admitting: *Deleted

## 2014-07-23 MED ORDER — GABAPENTIN 300 MG PO CAPS: 300.0000 mg | ORAL_CAPSULE | Freq: Three times a day (TID) | ORAL | Status: DC

## 2014-07-23 NOTE — Telephone Encounter (Signed)
Patient daughter, [name] called and requested to be faxed to pharmacy.

## 2014-08-04 ENCOUNTER — Other Ambulatory Visit: Payer: Self-pay | Admitting: Internal Medicine

## 2014-08-05 ENCOUNTER — Ambulatory Visit (INDEPENDENT_AMBULATORY_CARE_PROVIDER_SITE_OTHER): Payer: Medicare Other | Admitting: Internal Medicine

## 2014-08-05 ENCOUNTER — Encounter: Payer: Self-pay | Admitting: Internal Medicine

## 2014-08-05 VITALS — BP 132/80 | HR 77 | Temp 97.6°F | Ht 66.0 in | Wt 132.0 lb

## 2014-08-05 DIAGNOSIS — E1142 Type 2 diabetes mellitus with diabetic polyneuropathy: Secondary | ICD-10-CM

## 2014-08-05 DIAGNOSIS — E871 Hypo-osmolality and hyponatremia: Secondary | ICD-10-CM

## 2014-08-05 DIAGNOSIS — D638 Anemia in other chronic diseases classified elsewhere: Secondary | ICD-10-CM | POA: Diagnosis not present

## 2014-08-05 DIAGNOSIS — M2012 Hallux valgus (acquired), left foot: Secondary | ICD-10-CM

## 2014-08-05 DIAGNOSIS — I482 Chronic atrial fibrillation, unspecified: Secondary | ICD-10-CM

## 2014-08-05 DIAGNOSIS — M21611 Bunion of right foot: Secondary | ICD-10-CM

## 2014-08-05 DIAGNOSIS — E039 Hypothyroidism, unspecified: Secondary | ICD-10-CM

## 2014-08-05 DIAGNOSIS — I10 Essential (primary) hypertension: Secondary | ICD-10-CM | POA: Diagnosis not present

## 2014-08-05 DIAGNOSIS — Z8673 Personal history of transient ischemic attack (TIA), and cerebral infarction without residual deficits: Secondary | ICD-10-CM | POA: Diagnosis not present

## 2014-08-05 DIAGNOSIS — I248 Other forms of acute ischemic heart disease: Secondary | ICD-10-CM | POA: Diagnosis not present

## 2014-08-05 DIAGNOSIS — K311 Adult hypertrophic pyloric stenosis: Secondary | ICD-10-CM

## 2014-08-05 DIAGNOSIS — M2011 Hallux valgus (acquired), right foot: Secondary | ICD-10-CM

## 2014-08-05 DIAGNOSIS — K219 Gastro-esophageal reflux disease without esophagitis: Secondary | ICD-10-CM | POA: Diagnosis not present

## 2014-08-05 DIAGNOSIS — M21612 Bunion of left foot: Secondary | ICD-10-CM

## 2014-08-05 NOTE — Patient Instructions (Addendum)
Follow up in 3 mos for CPE/Medicare wellness  Reduce salt tablets to 3 daily. Continue all other medications as ordered  Return to office for fasting labs

## 2014-08-05 NOTE — Progress Notes (Signed)
Patient ID: Shannon [name], female   DOB: January 18, 1924, 81 y.o.   MRN: [id number]    Location:    [facility]   Place of Service:   OFFICE    Chief Complaint  Patient presents with  . Medical Management of Chronic Issues    2 month follow-up on sodium level. ? when to schedule annual wellness exam    HPI:  81 yo female seen today for f/u. She has seen GI, neurology and cardio since her last OV. No changes made in her medical regimen. She does not take any medications for diabetes. She has numbness or tingling in feet. She would like to see podiatry due to foot deformities. She has been going to [facility] for foot care.  No seizures since last OV  BP controlled on meds  GI told her to f/u prn. She is still doing pureed diet. No dysphagia or dyspesia. Takes PPI BID.  Past Medical History  Diagnosis Date  . Pneumonia   . Thyroid disease   . Seizures   . Stroke   . Hyperlipidemia   . Hypertension   . Diabetes mellitus without complication   . Small bowel obstruction   . Ventral hernia with bowel obstruction     Past Surgical History  Procedure Laterality Date  . Exploratory laparotomy  09-07-2013  . Cholecystectomy    . Tubal ligation    . Colectomy    . Hernia repair  2015  . Esophagogastroduodenoscopy N/A 01/19/2014    Procedure: ESOPHAGOGASTRODUODENOSCOPY (EGD);  Surgeon: Hilarie Fredrickson, MD;  Location: [facility];  Service: Endoscopy;  Laterality: N/A;  . Esophagogastroduodenoscopy N/A 01/20/2014    Procedure: ESOPHAGOGASTRODUODENOSCOPY (EGD);  Surgeon: Hilarie Fredrickson, MD;  Location: [facility];  Service: Endoscopy;  Laterality: N/A;  . Esophagogastroduodenoscopy N/A 03/19/2014    Procedure: ESOPHAGOGASTRODUODENOSCOPY (EGD);  Surgeon: Rachael Fee, MD;  Location: [facility];  Service: Endoscopy;  Laterality: N/A;    Patient Care Team: Kirt Boys, DO as PCP - General (Internal Medicine) Jason Coop, MD as Referring Physician (Cardiology) Carole Binning, MD as Referring Physician (Ophthalmology)  History   Social History  . Marital Status: Married    Spouse Name: N/A  . Number of Children: N/A  . Years of Education: N/A   Occupational History  . Not on file.   Social History Main Topics  . Smoking status: Never Smoker   . Smokeless tobacco: Not on file  . Alcohol Use: No  . Drug Use: No  . Sexual Activity: No   Other Topics Concern  . Not on file   Social History Narrative   Married in [place], lives in one story house with 2 people and no pets   Occupation: ?   Has a living Will, [place], and doesn't have DNR   Was living with Husband (in frail health) in [place] until 09/2013.  After her [facility] admission they both came to live with daughter in [place]     reports that she has never smoked. She does not have any smokeless tobacco history on file. She reports that she does not drink alcohol or use illicit drugs.  No Known Allergies  Medications: Patient's Medications  New Prescriptions   No medications on file  Previous Medications   ACETAMINOPHEN (TYLENOL) 325 MG TABLET    Take 650 mg by mouth every 6 (six) hours as needed for mild pain or fever.   ALBUTEROL (PROVENTIL HFA;VENTOLIN HFA) 108 (90 BASE) MCG/ACT INHALER  Inhale 2 puffs into the lungs every 6 (six) hours as needed for wheezing or shortness of breath.   AMLODIPINE (NORVASC) 10 MG TABLET    Take 10 mg by mouth daily. For HTN   ASPIRIN EC 81 MG TABLET    Take 81 mg by mouth every morning.   ATORVASTATIN (LIPITOR) 10 MG TABLET    TAKE 1 TABLET BY MOUTH EVERY DAY   CALCIUM-VITAMIN D (OSCAL WITH D) 500-200 MG-UNIT PER TABLET    Take 1 tablet by mouth daily with breakfast.    CHLORHEXIDINE (PERIDEX) 0.12 % SOLUTION    15 mLs by Mouth Rinse route 2 (two) times daily.   CLONIDINE (CATAPRES) 0.1 MG TABLET    Take 1 tablet (0.1 mg total) by mouth 2 (two) times daily.   CYCLOSPORINE (RESTASIS) 0.05 % OPHTHALMIC EMULSION    Place 1 drop into both eyes 2 (two)  times daily.   FEEDING SUPPLEMENT, GLUCERNA SHAKE, (GLUCERNA SHAKE) LIQD    Take 237 mLs by mouth 3 (three) times daily between meals.   FLUTICASONE (FLONASE) 50 MCG/ACT NASAL SPRAY    Place 2 sprays into both nostrils daily.   FLUTICASONE-SALMETEROL (ADVAIR DISKUS IN)    Inhale into the lungs as needed.   GABAPENTIN (NEURONTIN) 300 MG CAPSULE    Take 1 capsule (300 mg total) by mouth 3 (three) times daily.   HYDRALAZINE (APRESOLINE) 100 MG TABLET    TAKE 1 TABLET BY MOUTH 3 TIMES A DAY   ISOSORBIDE MONONITRATE (IMDUR) 30 MG 24 HR TABLET    Take one tablet by mouth once daily. If Chest Pains occur increase to two tablets daily.   L-METHYLFOLATE-B6-B12 (METANX) 3-35-2 MG TABS    Take 1 tablet by mouth every morning.   LACTULOSE (CHRONULAC) 10 GM/15ML SOLUTION    Take 15 mLs (10 g total) by mouth daily as needed for mild constipation.   LATANOPROST (XALATAN) 0.005 % OPHTHALMIC SOLUTION    Place 1 drop into both eyes at bedtime.   LEVOTHYROXINE (SYNTHROID, LEVOTHROID) 125 MCG TABLET    TAKE 2 TABLETS (250 MCG TOTAL) BY MOUTH DAILY BEFORE BREAKFAST.   METOPROLOL SUCCINATE (TOPROL-XL) 25 MG 24 HR TABLET    Take 25 mg by mouth daily.   MULTIPLE VITAMINS-MINERALS (MULTIVITAMIN WITH MINERALS) TABLET    Take 1 tablet by mouth every morning.    PANTOPRAZOLE (PROTONIX) 40 MG TABLET    TAKE 1 TABLET BY MOUTH TWICE A DAY   POLYETHYL GLYCOL-PROPYL GLYCOL 0.4-0.3 % SOLN    Apply to eye.   POLYETHYLENE GLYCOL (MIRALAX / GLYCOLAX) PACKET    Take 17 g by mouth 2 (two) times daily.   SENNOSIDES-DOCUSATE SODIUM (SENOKOT-S) 8.6-50 MG TABLET    Take 1 tablet by mouth 2 (two) times daily.    SERTRALINE (ZOLOFT) 100 MG TABLET    Take one tablet by mouth once daily for depression   SODIUM CHLORIDE 1 G TABLET    Take 2 tablets (2 g total) by mouth 3 (three) times daily with meals.   SYMBICORT 160-4.5 MCG/ACT INHALER    INHALE 2 PUFFS TWICE A DAY   TRAMADOL (ULTRAM) 50 MG TABLET    Take by mouth 2 (two) times daily as  needed for moderate pain.  Modified Medications   No medications on file  Discontinued Medications   CLONIDINE (CATAPRES - DOSED IN MG/24 HR) 0.3 MG/24HR PATCH       KIONEX 15 GM/60ML SUSPENSION       L-METHYLFOLATE-ALGAE-B12-B6 (METANX) 3-90.314-2-35  MG CAPS    Take 1 tablet by mouth daily.    Review of Systems  Constitutional: Negative for fever, chills, diaphoresis, activity change, appetite change and fatigue.  HENT: Negative for ear pain and sore throat.   Eyes: Positive for visual disturbance (wears glasses).  Respiratory: Negative for cough, chest tightness and shortness of breath.   Cardiovascular: Negative for chest pain, palpitations and leg swelling.  Gastrointestinal: Negative for nausea, vomiting, abdominal pain, diarrhea, constipation and blood in stool.  Genitourinary: Negative for dysuria.  Musculoskeletal: Negative for arthralgias.  Neurological: Positive for numbness. Negative for dizziness, tremors and headaches.  Psychiatric/Behavioral: Negative for sleep disturbance. The patient is not nervous/anxious.     Filed Vitals:   08/05/14 1030  BP: 132/80  Pulse: 77  Temp: 97.6 F (36.4 C)  TempSrc: Oral  Height: 5\' 6"  (1.676 m)  Weight: 132 lb (59.875 kg)  SpO2: 96%   Body mass index is 21.32 kg/(m^2).  Physical Exam  Constitutional: She is oriented to person, place, and time. She appears well-developed and well-nourished.  HENT:  Mouth/Throat: Oropharynx is clear and moist. No oropharyngeal exudate.  Eyes: Pupils are equal, round, and reactive to light. No scleral icterus.  Neck: Neck supple. No tracheal deviation present. No thyromegaly present.  Cardiovascular: Normal rate, regular rhythm, normal heart sounds and intact distal pulses.  Exam reveals no gallop and no friction rub.   No murmur heard. No LE edema b/l. no calf TTP. No carotid bruit b/l  Pulmonary/Chest: Effort normal and breath sounds normal. No stridor. No respiratory distress. She has no  wheezes. She has no rales.  Abdominal: Soft. Bowel sounds are normal. She exhibits no distension and no mass. There is no tenderness. There is no rebound and no guarding.  Musculoskeletal: She exhibits edema.  (+) hammertoes and claw toes. No ulcerations or callus formation  Lymphadenopathy:    She has no cervical adenopathy.  Neurological: She is alert and oriented to person, place, and time. She has normal reflexes.  Monofilament testing reduced L>R foot  Skin: Skin is warm and dry. No rash noted.  Psychiatric: She has a normal mood and affect. Her behavior is normal. Thought content normal.     Labs reviewed: Appointment on 06/09/2014  Component Date Value Ref Range Status  . Glucose 06/09/2014 108* 65 - 99 mg/dL Final  . BUN 28/31/5176 24  8 - 27 mg/dL Final  . Creatinine, Ser 06/09/2014 1.21* 0.57 - 1.00 mg/dL Final  . GFR calc non Af Amer 06/09/2014 40* >59 mL/min/1.73 Final  . GFR calc Af Amer 06/09/2014 46* >59 mL/min/1.73 Final  . BUN/Creatinine Ratio 06/09/2014 20  11 - 26 Final  . Sodium 06/09/2014 134  134 - 144 mmol/L Final  . Potassium 06/09/2014 5.1  3.5 - 5.2 mmol/L Final  . Chloride 06/09/2014 97  97 - 108 mmol/L Final  . CO2 06/09/2014 26  18 - 29 mmol/L Final  . Calcium 06/09/2014 9.9  8.7 - 10.3 mg/dL Final  Office Visit on 06/02/2014  Component Date Value Ref Range Status  . Glucose 06/02/2014 127* 65 - 99 mg/dL Final  . BUN 16/10/3708 17  8 - 27 mg/dL Final  . Creatinine, Ser 06/02/2014 1.24* 0.57 - 1.00 mg/dL Final  . GFR calc non Af Amer 06/02/2014 39* >59 mL/min/1.73 Final  . GFR calc Af Amer 06/02/2014 45* >59 mL/min/1.73 Final  . BUN/Creatinine Ratio 06/02/2014 14  11 - 26 Final  . Sodium 06/02/2014 125* 134 - 144  mmol/L Final  . Potassium 06/02/2014 5.5* 3.5 - 5.2 mmol/L Final  . Chloride 06/02/2014 90* 97 - 108 mmol/L Final  . CO2 06/02/2014 22  18 - 29 mmol/L Final  . Calcium 06/02/2014 10.1  8.7 - 10.3 mg/dL Final  . WBC 91/47/8295 5.3  3.4 -  10.8 x10E3/uL Final  . RBC 06/02/2014 3.14* 3.77 - 5.28 x10E6/uL Final  . Hemoglobin 06/02/2014 9.4* 11.1 - 15.9 g/dL Final  . HCT 62/13/0865 29.2* 34.0 - 46.6 % Final  . MCV 06/02/2014 93  79 - 97 fL Final  . MCH 06/02/2014 29.9  26.6 - 33.0 pg Final  . MCHC 06/02/2014 32.2  31.5 - 35.7 g/dL Final  . RDW 78/46/9629 16.7* 12.3 - 15.4 % Final  . Platelets 06/02/2014 397* 150 - 379 x10E3/uL Final  . Neutrophils Relative % 06/02/2014 60   Final  . Lymphs 06/02/2014 23   Final  . Monocytes 06/02/2014 13   Final  . Eos 06/02/2014 3   Final  . Basos 06/02/2014 1   Final  . Neutrophils Absolute 06/02/2014 3.2  1.4 - 7.0 x10E3/uL Final  . Lymphocytes Absolute 06/02/2014 1.2  0.7 - 3.1 x10E3/uL Final  . Monocytes Absolute 06/02/2014 0.7  0.1 - 0.9 x10E3/uL Final  . Eosinophils Absolute 06/02/2014 0.2  0.0 - 0.4 x10E3/uL Final  . Basophils Absolute 06/02/2014 0.1  0.0 - 0.2 x10E3/uL Final  . Immature Granulocytes 06/02/2014 0   Final  . Immature Grans (Abs) 06/02/2014 0.0  0.0 - 0.1 x10E3/uL Final  Admission on 05/21/2014, Discharged on 05/30/2014  No results displayed because visit has over 200 results.      No results found.   Assessment/Plan   ICD-9-CM ICD-10-CM   1. Hyponatremia 276.1 E87.1 CMP   resolving  2. Essential hypertension - stable; cont meds 401.9 I10   3. Hypothyroidism, unspecified hypothyroidism type - stable on med 244.9 E03.9 TSH     T4, Free  4. Chronic atrial fibrillation - rate controlled on meds 427.31 I48.2   5. Gastric outlet obstruction - stable; cont PPI BID 537.0 K31.1   6. History of stroke - cont ASA V12.54 Z86.73   7. Anemia of chronic disease - stable Hgb; will follow 285.29 D63.8   8. Gastroesophageal reflux disease without esophagitis - stable on PPI 530.81 K21.9   9. Diabetic polyneuropathy associated with type 2 diabetes mellitus - stable with diet 250.60 E11.42 CMP   357.2  Hemoglobin A1c     Lipid Panel     Ambulatory referral to Podiatry  10.  Bilateral bunions  727.1 M20.11 Ambulatory referral to Podiatry    M20.12     --Follow up in 3 mos for CPE/Medicare wellness  --Reduce salt tablets to 3 daily. Continue all other medications as ordered  --Return to office for fasting labs  --will call with referral info  Angela Platner S. Ancil Linsey  [facility] [address] [place], [place] [address] [phone] Cell (Monday-Friday 8 AM - 5 PM) [phone] After 5 PM and follow prompts

## 2014-08-06 ENCOUNTER — Other Ambulatory Visit: Payer: Self-pay | Admitting: Internal Medicine

## 2014-08-07 ENCOUNTER — Other Ambulatory Visit: Payer: Self-pay | Admitting: *Deleted

## 2014-08-07 MED ORDER — FLUTICASONE PROPIONATE 50 MCG/ACT NA SUSP: 2.0000 | Freq: Every day | NASAL | Status: DC

## 2014-08-07 NOTE — Telephone Encounter (Signed)
[name] called and requested refill. Faxed.

## 2014-08-26 ENCOUNTER — Other Ambulatory Visit: Payer: Self-pay | Admitting: *Deleted

## 2014-08-26 ENCOUNTER — Ambulatory Visit (INDEPENDENT_AMBULATORY_CARE_PROVIDER_SITE_OTHER): Payer: Medicare Other | Admitting: Podiatry

## 2014-08-26 ENCOUNTER — Ambulatory Visit (INDEPENDENT_AMBULATORY_CARE_PROVIDER_SITE_OTHER): Payer: Medicare Other

## 2014-08-26 VITALS — BP 153/69 | HR 62 | Temp 97.0°F | Resp 16

## 2014-08-26 DIAGNOSIS — R52 Pain, unspecified: Secondary | ICD-10-CM

## 2014-08-26 DIAGNOSIS — B351 Tinea unguium: Secondary | ICD-10-CM | POA: Diagnosis not present

## 2014-08-26 DIAGNOSIS — E1140 Type 2 diabetes mellitus with diabetic neuropathy, unspecified: Secondary | ICD-10-CM | POA: Diagnosis not present

## 2014-08-26 DIAGNOSIS — I248 Other forms of acute ischemic heart disease: Secondary | ICD-10-CM | POA: Diagnosis not present

## 2014-08-26 DIAGNOSIS — M79676 Pain in unspecified toe(s): Secondary | ICD-10-CM | POA: Diagnosis not present

## 2014-08-26 DIAGNOSIS — E1149 Type 2 diabetes mellitus with other diabetic neurological complication: Secondary | ICD-10-CM

## 2014-08-26 MED ORDER — GABAPENTIN 300 MG PO CAPS: 300.0000 mg | ORAL_CAPSULE | Freq: Three times a day (TID) | ORAL | Status: DC

## 2014-08-26 NOTE — Telephone Encounter (Signed)
Patient daughter requested.  

## 2014-08-26 NOTE — Progress Notes (Signed)
Subjective:    Patient ID: Shannon [name], female    DOB: 13-Jul-1923, 81 y.o.   MRN: [id number]  HPI  81 year old female presents the office today with complaints of thick, elongated toenails. She states that she is unable to trim them herself. Last time she tried trimming them she file and down to close and she had bleeding. She is diabetic with neuropathy. She is currently taking gabapentin. Denies any history of ulceration when claudication symptoms. No other complaints at this time.    Review of Systems  Eyes: Positive for itching.  Musculoskeletal: Positive for back pain and gait problem.       Muscle pain, difficult walking  Hematological:       Bruise easy, slow to heel       Objective:   Physical Exam AAO x3, NAD DP/PT pulses palpable but decreased bilaterally, CRT less than 3 seconds Protective sensation decreased with Simms Weinstein monofilament, vibratory sensation decreased, Achilles tendon reflex intact Nails are hypertrophic, dystrophic, discolored, elongated, brittle. There is no nail on the right third toe. There is tenderness to palpation overlying nails 9. There is no swelling erythema or drainage. Hammertoe contractures with HAV present. No areas of tenderness to bilateral lower extremities. MMT 5/5, ROM WNL.  No open lesions or pre-ulcerative lesions at this time.  No overlying edema, erythema, increase in warmth to bilateral lower extremities.  No pain with calf compression, swelling, warmth, erythema bilaterally.     Assessment & Plan:  81 year old female with symptomatic onychomycosis, neuropathy -Treatment options discussed including alternatives, risks, complications. -Nail sharply debrided 9 without complication/bleeding. -Discussed importance daily foot inspection. -Follow-up in 3 months or sooner if any problems are to arise. In the meantime, encouraged to call the office with any questions, concerns, change in symptoms. Follow-up with PCP for other  issues mentioned in the ROS.

## 2014-09-03 ENCOUNTER — Other Ambulatory Visit: Payer: Self-pay | Admitting: Internal Medicine

## 2014-09-10 ENCOUNTER — Other Ambulatory Visit: Payer: Self-pay | Admitting: Internal Medicine

## 2014-09-23 ENCOUNTER — Other Ambulatory Visit: Payer: Self-pay | Admitting: *Deleted

## 2014-09-23 MED ORDER — GABAPENTIN 300 MG PO CAPS: 300.0000 mg | ORAL_CAPSULE | Freq: Three times a day (TID) | ORAL | Status: DC

## 2014-09-23 MED ORDER — METOPROLOL SUCCINATE ER 25 MG PO TB24: 25.0000 mg | ORAL_TABLET | Freq: Every day | ORAL | Status: DC

## 2014-09-23 NOTE — Telephone Encounter (Signed)
[name] caregiver requested refill to be faxed to pharmacy.

## 2014-09-29 ENCOUNTER — Encounter: Payer: Medicare Other | Admitting: Internal Medicine

## 2014-10-07 ENCOUNTER — Telehealth: Payer: Self-pay

## 2014-10-07 NOTE — Telephone Encounter (Signed)
Patient's daughter called indicating the pharmacy is not able to fill Protonix for #60, patient was only given 37. We need to call [phone].  I called above number, I initiated a prior authorization. Medication approved for # 60 starting 08/08/14-10/07/15  I called the pharmacy on file with status update.  Patient's daughter aware

## 2014-10-12 ENCOUNTER — Telehealth: Payer: Self-pay | Admitting: *Deleted

## 2014-10-12 NOTE — Telephone Encounter (Signed)
Received form for Therapeutic Footwear from [facility]. Form given to Dr. Eulas Post to review and sign. OV notes from 08/05/2014 printed and attached. To be faxed to #[phone] option 2

## 2014-10-19 ENCOUNTER — Other Ambulatory Visit: Payer: Self-pay | Admitting: Adult Health

## 2014-10-23 DIAGNOSIS — Z1231 Encounter for screening mammogram for malignant neoplasm of breast: Secondary | ICD-10-CM | POA: Diagnosis not present

## 2014-10-23 LAB — HM MAMMOGRAPHY: HM Mammogram: NEGATIVE

## 2014-11-02 ENCOUNTER — Other Ambulatory Visit: Payer: Medicare Other

## 2014-11-02 DIAGNOSIS — E119 Type 2 diabetes mellitus without complications: Secondary | ICD-10-CM

## 2014-11-02 DIAGNOSIS — E785 Hyperlipidemia, unspecified: Secondary | ICD-10-CM

## 2014-11-02 DIAGNOSIS — E038 Other specified hypothyroidism: Secondary | ICD-10-CM

## 2014-11-02 DIAGNOSIS — I10 Essential (primary) hypertension: Secondary | ICD-10-CM

## 2014-11-03 ENCOUNTER — Other Ambulatory Visit: Payer: Self-pay | Admitting: Internal Medicine

## 2014-11-03 LAB — COMPREHENSIVE METABOLIC PANEL
ALT: 13 IU/L (ref 0–32)
AST: 18 IU/L (ref 0–40)
Albumin/Globulin Ratio: 1.5 (ref 1.1–2.5)
Albumin: 4.1 g/dL (ref 3.5–4.7)
Alkaline Phosphatase: 71 IU/L (ref 39–117)
BUN/Creatinine Ratio: 24 (ref 11–26)
BUN: 31 mg/dL — ABNORMAL HIGH (ref 8–27)
Bilirubin Total: 0.3 mg/dL (ref 0.0–1.2)
CO2: 21 mmol/L (ref 18–29)
Calcium: 9.8 mg/dL (ref 8.7–10.3)
Chloride: 100 mmol/L (ref 97–108)
Creatinine, Ser: 1.31 mg/dL — ABNORMAL HIGH (ref 0.57–1.00)
GFR calc Af Amer: 42 mL/min/{1.73_m2} — ABNORMAL LOW (ref >59)
GFR calc non Af Amer: 36 mL/min/{1.73_m2} — ABNORMAL LOW (ref >59)
Globulin, Total: 2.7 g/dL (ref 1.5–4.5)
Glucose: 94 mg/dL (ref 65–99)
Potassium: 6 mmol/L — ABNORMAL HIGH (ref 3.5–5.2)
Sodium: 137 mmol/L (ref 134–144)
Total Protein: 6.8 g/dL (ref 6.0–8.5)

## 2014-11-03 LAB — LIPID PANEL
Chol/HDL Ratio: 1.7 ratio units (ref 0.0–4.4)
Cholesterol, Total: 124 mg/dL (ref 100–199)
HDL: 75 mg/dL (ref >39)
LDL Calculated: 33 mg/dL (ref 0–99)
Triglycerides: 82 mg/dL (ref 0–149)
VLDL Cholesterol Cal: 16 mg/dL (ref 5–40)

## 2014-11-03 LAB — T4, FREE: Free T4: 2.15 ng/dL — ABNORMAL HIGH (ref 0.82–1.77)

## 2014-11-03 LAB — HEMOGLOBIN A1C
Est. average glucose Bld gHb Est-mCnc: 137 mg/dL
Hgb A1c MFr Bld: 6.4 % — ABNORMAL HIGH (ref 4.8–5.6)

## 2014-11-03 LAB — TSH: TSH: 0.022 u[IU]/mL — ABNORMAL LOW (ref 0.450–4.500)

## 2014-11-04 ENCOUNTER — Encounter: Payer: Self-pay | Admitting: *Deleted

## 2014-11-04 ENCOUNTER — Other Ambulatory Visit: Payer: Self-pay | Admitting: *Deleted

## 2014-11-04 MED ORDER — AMLODIPINE BESYLATE 10 MG PO TABS: 10.0000 mg | ORAL_TABLET | Freq: Every day | ORAL | Status: DC

## 2014-11-06 ENCOUNTER — Encounter: Payer: Self-pay | Admitting: Internal Medicine

## 2014-11-06 ENCOUNTER — Ambulatory Visit (INDEPENDENT_AMBULATORY_CARE_PROVIDER_SITE_OTHER): Payer: Medicare Other | Admitting: Internal Medicine

## 2014-11-06 VITALS — BP 136/78 | HR 64 | Resp 16 | Ht 66.0 in | Wt 129.0 lb

## 2014-11-06 DIAGNOSIS — R2681 Unsteadiness on feet: Secondary | ICD-10-CM | POA: Diagnosis not present

## 2014-11-06 DIAGNOSIS — I482 Chronic atrial fibrillation, unspecified: Secondary | ICD-10-CM

## 2014-11-06 DIAGNOSIS — Z8673 Personal history of transient ischemic attack (TIA), and cerebral infarction without residual deficits: Secondary | ICD-10-CM | POA: Diagnosis not present

## 2014-11-06 DIAGNOSIS — E1142 Type 2 diabetes mellitus with diabetic polyneuropathy: Secondary | ICD-10-CM | POA: Diagnosis not present

## 2014-11-06 DIAGNOSIS — Z780 Asymptomatic menopausal state: Secondary | ICD-10-CM | POA: Diagnosis not present

## 2014-11-06 DIAGNOSIS — R634 Abnormal weight loss: Secondary | ICD-10-CM

## 2014-11-06 DIAGNOSIS — I10 Essential (primary) hypertension: Secondary | ICD-10-CM | POA: Diagnosis not present

## 2014-11-06 DIAGNOSIS — E785 Hyperlipidemia, unspecified: Secondary | ICD-10-CM

## 2014-11-06 DIAGNOSIS — E039 Hypothyroidism, unspecified: Secondary | ICD-10-CM | POA: Diagnosis not present

## 2014-11-06 DIAGNOSIS — Z0000 Encounter for general adult medical examination without abnormal findings: Secondary | ICD-10-CM | POA: Diagnosis not present

## 2014-11-06 MED ORDER — LEVOTHYROXINE SODIUM 125 MCG PO TABS: 125.0000 ug | ORAL_TABLET | Freq: Every day | ORAL | Status: DC

## 2014-11-06 MED ORDER — LEVOTHYROXINE SODIUM 100 MCG PO TABS: 100.0000 ug | ORAL_TABLET | Freq: Every day | ORAL | Status: DC

## 2014-11-06 NOTE — Progress Notes (Signed)
Patient ID: Shannon [name], female   DOB: 1924-01-30, 81 y.o.   MRN: [id number] Subjective:    Shannon [name] is a 81 y.o. female who presents for Medicare Initial preventive examination. Her last eye exam was in April 2016 by Dr Alvino Chapel. Her daughter is present  Preventive Screening-Counseling & Management  Tobacco History  Smoking status  . Never Smoker   Smokeless tobacco  . Not on file     Problems Prior to Visit 1. Weight loss  Current Problems (verified) Patient Active Problem List   Diagnosis Date Noted  . Cerebrovascular disease 07/14/2014  . Diabetic polyneuropathy associated with type 2 diabetes mellitus 07/14/2014  . Hyperlipidemia 07/14/2014  . Gastric distention   . Gastric outlet obstruction 05/21/2014  . Unstable gait 04/29/2014  . Hyponatremia 03/23/2014  . Acute encephalopathy 03/22/2014  . Slurred speech   . Chest pain   . Gastroparesis   . Slow transit constipation 02/03/2014  . Carotid stenosis 02/03/2014  . Demand ischemia 01/21/2014  . Gastric bezoar 01/20/2014  . Anemia of chronic disease 01/18/2014  . CKD (chronic kidney disease) stage 3, GFR 30-59 ml/min 01/18/2014  . Depression 09/29/2013  . Perforated ulcer 09/29/2013  . Essential hypertension, benign 09/22/2013  . GERD (gastroesophageal reflux disease) 09/22/2013  . Dyslipidemia 09/22/2013  . DM (diabetes mellitus) type II uncontrolled, periph vascular disorder 09/22/2013  . A-fib 09/22/2013  . CVA (cerebral vascular accident) 09/22/2013  . Diabetic neuropathy 09/22/2013  . Hypothyroidism 09/22/2013  . Chronic gastrojejunal ulcer with perforation 09/07/2013  . Diabetes 09/07/2013  . Accelerated hypertension 09/07/2013  . Systemic lupus erythematosus (SLE) inhibitor 09/07/2013   Past Surgical History  Procedure Laterality Date  . Exploratory laparotomy  09-07-2013  . Cholecystectomy    . Tubal ligation    . Colectomy    . Hernia repair  2015  . Esophagogastroduodenoscopy N/A 01/19/2014     Procedure: ESOPHAGOGASTRODUODENOSCOPY (EGD);  Surgeon: Hilarie Fredrickson, MD;  Location: [facility];  Service: Endoscopy;  Laterality: N/A;  . Esophagogastroduodenoscopy N/A 01/20/2014    Procedure: ESOPHAGOGASTRODUODENOSCOPY (EGD);  Surgeon: Hilarie Fredrickson, MD;  Location: [facility];  Service: Endoscopy;  Laterality: N/A;  . Esophagogastroduodenoscopy N/A 03/19/2014    Procedure: ESOPHAGOGASTRODUODENOSCOPY (EGD);  Surgeon: Rachael Fee, MD;  Location: [facility];  Service: Endoscopy;  Laterality: N/A;   Family History  Problem Relation Age of Onset  . Stroke Mother   . Heart attack Neg Hx   . Hypertension Mother   . Diabetes Brother    History   Social History  . Marital Status: Married    Spouse Name: N/A  . Number of Children: N/A  . Years of Education: N/A   Social History Main Topics  . Smoking status: Never Smoker   . Smokeless tobacco: Not on file  . Alcohol Use: No  . Drug Use: No  . Sexual Activity: No   Other Topics Concern  . None   Social History Narrative   Married in [place], lives in one story house with 2 people and no pets   Occupation: ?   Has a living Will, [place], and doesn't have DNR   Was living with Husband (in frail health) in [place] until 09/2013.  After her [facility] admission they both came to live with daughter in [place]     Medications Prior to Visit Current Outpatient Prescriptions on File Prior to Visit  Medication Sig Dispense Refill  . acetaminophen (TYLENOL) 325 MG tablet Take 650 mg by mouth every  6 (six) hours as needed for mild pain or fever.    [name] albuterol (PROVENTIL HFA;VENTOLIN HFA) 108 (90 BASE) MCG/ACT inhaler Inhale 2 puffs into the lungs every 6 (six) hours as needed for wheezing or shortness of breath. 1 Inhaler 2  . amLODipine (NORVASC) 10 MG tablet Take 1 tablet (10 mg total) by mouth daily. For HTN 30 tablet 3  . aspirin EC 81 MG tablet Take 81 mg by mouth every morning.    [name] atorvastatin (LIPITOR) 10 MG tablet TAKE 1  TABLET BY MOUTH EVERY DAY 30 tablet 5  . calcium-vitamin D (OSCAL WITH D) 500-200 MG-UNIT per tablet Take 1 tablet by mouth daily with breakfast.     . chlorhexidine (PERIDEX) 0.12 % solution 15 mLs by Mouth Rinse route 2 (two) times daily. 120 mL 0  . cloNIDine (CATAPRES) 0.1 MG tablet Take 1 tablet (0.1 mg total) by mouth 2 (two) times daily. 60 tablet 11  . feeding supplement, GLUCERNA SHAKE, (GLUCERNA SHAKE) LIQD Take 237 mLs by mouth 3 (three) times daily between meals. 90 Can 1  . fluticasone (FLONASE) 50 MCG/ACT nasal spray Place 2 sprays into both nostrils daily. 16 g 3  . Fluticasone-Salmeterol (ADVAIR DISKUS IN) Inhale into the lungs as needed.    . gabapentin (NEURONTIN) 300 MG capsule Take 1 capsule (300 mg total) by mouth 3 (three) times daily. 90 capsule 3  . hydrALAZINE (APRESOLINE) 100 MG tablet TAKE 1 TABLET BY MOUTH 3 TIMES A DAY 90 tablet 5  . isosorbide mononitrate (IMDUR) 30 MG 24 hr tablet Take one tablet by mouth once daily. If Chest Pains occur increase to two tablets daily. 90 tablet 3  . L-Methylfolate-Algae-B12-B6 (METANX) 3-90.314-2-35 MG CAPS TAKE ONE TABLET BY MOUTH ONCE DAILY 30 capsule 3  . lactulose (CHRONULAC) 10 GM/15ML solution Take 15 mLs (10 g total) by mouth daily as needed for mild constipation. 240 mL 0  . latanoprost (XALATAN) 0.005 % ophthalmic solution Place 1 drop into both eyes at bedtime. 2.5 mL 12  . levothyroxine (SYNTHROID, LEVOTHROID) 125 MCG tablet TAKE 2 TABLETS (250 MCG TOTAL) BY MOUTH DAILY BEFORE BREAKFAST. 60 tablet 5  . metoprolol succinate (TOPROL-XL) 25 MG 24 hr tablet Take 1 tablet (25 mg total) by mouth daily. 30 tablet 3  . Multiple Vitamins-Minerals (MULTIVITAMIN WITH MINERALS) tablet Take 1 tablet by mouth every morning.     . pantoprazole (PROTONIX) 40 MG tablet TAKE 1 TABLET BY MOUTH TWICE A DAY 60 tablet 11  . Polyethyl Glycol-Propyl Glycol 0.4-0.3 % SOLN Apply to eye.    . polyethylene glycol (MIRALAX / GLYCOLAX) packet Take 17 g  by mouth 2 (two) times daily.    . RESTASIS 0.05 % ophthalmic emulsion USE 1 DROP INTO BOTH EYES TWICE DAILY 60 mL 1  . sennosides-docusate sodium (SENOKOT-S) 8.6-50 MG tablet Take 1 tablet by mouth 2 (two) times daily.     . sertraline (ZOLOFT) 100 MG tablet Take one tablet by mouth once daily for depression 30 tablet 3  . sodium chloride 1 G tablet TAKE 1 TABLET BY MOUTH THREE TIMES DAILY 90 tablet 2  . traMADol (ULTRAM) 50 MG tablet Take by mouth 2 (two) times daily as needed for moderate pain.     No current facility-administered medications on file prior to visit.    Current Medications (verified) Current Outpatient Prescriptions  Medication Sig Dispense Refill  . acetaminophen (TYLENOL) 325 MG tablet Take 650 mg by mouth every 6 (six) hours as needed  for mild pain or fever.    [name] albuterol (PROVENTIL HFA;VENTOLIN HFA) 108 (90 BASE) MCG/ACT inhaler Inhale 2 puffs into the lungs every 6 (six) hours as needed for wheezing or shortness of breath. 1 Inhaler 2  . amLODipine (NORVASC) 10 MG tablet Take 1 tablet (10 mg total) by mouth daily. For HTN 30 tablet 3  . aspirin EC 81 MG tablet Take 81 mg by mouth every morning.    [name] atorvastatin (LIPITOR) 10 MG tablet TAKE 1 TABLET BY MOUTH EVERY DAY 30 tablet 5  . calcium-vitamin D (OSCAL WITH D) 500-200 MG-UNIT per tablet Take 1 tablet by mouth daily with breakfast.     . chlorhexidine (PERIDEX) 0.12 % solution 15 mLs by Mouth Rinse route 2 (two) times daily. 120 mL 0  . cloNIDine (CATAPRES) 0.1 MG tablet Take 1 tablet (0.1 mg total) by mouth 2 (two) times daily. 60 tablet 11  . feeding supplement, GLUCERNA SHAKE, (GLUCERNA SHAKE) LIQD Take 237 mLs by mouth 3 (three) times daily between meals. 90 Can 1  . fluticasone (FLONASE) 50 MCG/ACT nasal spray Place 2 sprays into both nostrils daily. 16 g 3  . Fluticasone-Salmeterol (ADVAIR DISKUS IN) Inhale into the lungs as needed.    . gabapentin (NEURONTIN) 300 MG capsule Take 1 capsule (300 mg total) by  mouth 3 (three) times daily. 90 capsule 3  . hydrALAZINE (APRESOLINE) 100 MG tablet TAKE 1 TABLET BY MOUTH 3 TIMES A DAY 90 tablet 5  . isosorbide mononitrate (IMDUR) 30 MG 24 hr tablet Take one tablet by mouth once daily. If Chest Pains occur increase to two tablets daily. 90 tablet 3  . L-Methylfolate-Algae-B12-B6 (METANX) 3-90.314-2-35 MG CAPS TAKE ONE TABLET BY MOUTH ONCE DAILY 30 capsule 3  . lactulose (CHRONULAC) 10 GM/15ML solution Take 15 mLs (10 g total) by mouth daily as needed for mild constipation. 240 mL 0  . latanoprost (XALATAN) 0.005 % ophthalmic solution Place 1 drop into both eyes at bedtime. 2.5 mL 12  . levothyroxine (SYNTHROID, LEVOTHROID) 125 MCG tablet TAKE 2 TABLETS (250 MCG TOTAL) BY MOUTH DAILY BEFORE BREAKFAST. 60 tablet 5  . metoprolol succinate (TOPROL-XL) 25 MG 24 hr tablet Take 1 tablet (25 mg total) by mouth daily. 30 tablet 3  . Multiple Vitamins-Minerals (MULTIVITAMIN WITH MINERALS) tablet Take 1 tablet by mouth every morning.     . pantoprazole (PROTONIX) 40 MG tablet TAKE 1 TABLET BY MOUTH TWICE A DAY 60 tablet 11  . Polyethyl Glycol-Propyl Glycol 0.4-0.3 % SOLN Apply to eye.    . polyethylene glycol (MIRALAX / GLYCOLAX) packet Take 17 g by mouth 2 (two) times daily.    . RESTASIS 0.05 % ophthalmic emulsion USE 1 DROP INTO BOTH EYES TWICE DAILY 60 mL 1  . sennosides-docusate sodium (SENOKOT-S) 8.6-50 MG tablet Take 1 tablet by mouth 2 (two) times daily.     . sertraline (ZOLOFT) 100 MG tablet Take one tablet by mouth once daily for depression 30 tablet 3  . sodium chloride 1 G tablet TAKE 1 TABLET BY MOUTH THREE TIMES DAILY 90 tablet 2  . sodium chloride 1 G tablet Take 1 tablet (1 g total) by mouth 3 (three) times daily with meals. 180 tablet 1  . traMADol (ULTRAM) 50 MG tablet Take by mouth 2 (two) times daily as needed for moderate pain.     No current facility-administered medications for this visit.     Allergies (verified) Review of patient's allergies  indicates no known allergies.  PAST HISTORY  Family History Family History  Problem Relation Age of Onset  . Stroke Mother   . Heart attack Neg Hx   . Hypertension Mother   . Diabetes Brother     Social History History  Substance Use Topics  . Smoking status: Never Smoker   . Smokeless tobacco: Not on file  . Alcohol Use: No     Are there smokers in your home (other than you)? nonsmoker Risk Factors Current exercise habits: The patient does not participate in regular exercise at present.  Dietary issues discussed: healthy eating habits   Cardiac risk factors: advanced age (older than 39 for men, 37 for women), diabetes mellitus, dyslipidemia and hypertension.  Depression Screen (Note: if answer to either of the following is "Yes", a more complete depression screening is indicated)   Over the past 2 weeks, have you felt down, depressed or hopeless? no  Over the past 2 weeks, have you felt little interest or pleasure in doing things? no  Have you lost interest or pleasure in daily life? no  Do you often feel hopeless? no  Do you cry easily over simple problems? no  Activities of Daily Living In your present state of health, do you have any difficulty performing the following activities?:  Driving? yes Managing money?  no Feeding yourself? no Getting from bed to chair? no Climbing a flight of stairs? yes Preparing food and eating?: no Bathing or showering? no Getting dressed: no Getting to the toilet? no Using the toilet:no Moving around from place to place: no In the past year have you fallen or had a near fall?:no   Are you sexually active?  no  Do you have more than one partner?  NA  Hearing Difficulties: yes Do you often ask people to speak up or repeat themselves? yes Do you experience ringing or noises in your ears? yes Do you have difficulty understanding soft or whispered voices? yes   Do you feel that you have a problem with memory? yes  Do you often  misplace items? yes  Do you feel safe at home?  yes  Cognitive Testing   Advanced Directives have been discussed with the patient? Yes - HCPOA, living will  List the Names of Other Physician/Practitioners you currently use: 1.  Cardio (Dr Delton See) 2.  Neuro (Dr Madelaine Bhat) 3. Podiatry 4.  Dentist 5. GI (Dr Marina Goodell)  Indicate any recent Medical Services you may have received from other than [facility] providers in the past year (date may be approximate). -  Dr Alvino Chapel (eye specialist)  Immunization History  Administered Date(s) Administered  . Influenza,inj,Quad PF,36+ Mos 12/24/2013  . Influenza-Unspecified 12/02/2012  . Pneumococcal Conjugate-13 02/03/2014  . Zoster 04/12/2011    Screening Tests Health Maintenance  Topic Date Due  . OPHTHALMOLOGY EXAM  06/15/1933  . URINE MICROALBUMIN  06[birth year]15/1933  . INFLUENZA VACCINE  11/02/2014  . TETANUS/TDAP  04/03/2016 (Originally 06/14/1942)  . PNA vac Low Risk Adult (2 of 2 - PPSV23) 02/04/2015  . HEMOGLOBIN A1C  05/05/2015  . FOOT EXAM  08/05/2015  . COLONOSCOPY  04/03/2018  . DEXA SCAN  Completed  . ZOSTAVAX  Completed    All answers were reviewed with the patient and necessary referrals were made:  Kirt Boys, DO   11/06/2014   History reviewed: allergies, current medications, past family history, past medical history, past social history, past surgical history and problem list  Review of Systems   Review of Systems  Constitutional: Positive for  weight loss and malaise/fatigue. Negative for fever and chills.  HENT: Negative for sore throat and tinnitus.   Eyes: Negative for blurred vision and double vision.  Respiratory: Negative for cough, shortness of breath and wheezing.   Cardiovascular: Negative for chest pain, palpitations, orthopnea and leg swelling.  Gastrointestinal: Negative for heartburn, nausea, vomiting, abdominal pain, diarrhea, constipation and blood in stool.  Genitourinary: Negative for dysuria, urgency, frequency and  hematuria.  Musculoskeletal: Positive for joint pain. Negative for myalgias and falls.  Skin: Negative for rash.  Neurological: Negative for dizziness, tingling, tremors, sensory change, focal weakness, seizures, loss of consciousness, weakness and headaches.  Endo/Heme/Allergies: Negative for environmental allergies. Does not bruise/bleed easily.  Psychiatric/Behavioral: Negative for depression and memory loss. The patient is not nervous/anxious and does not have insomnia.       Objective:     Body mass index is 20.83 kg/(m^2). BP 136/78 mmHg  Pulse 64  Resp 16  Ht 5\' 6"  (1.676 m)  Wt 129 lb (58.514 kg)  BMI 20.83 kg/m2  SpO2 92%  Physical Exam  Constitutional: She is oriented to person, place, and time and well-developed, well-nourished, and in no distress.  HENT:  Head: Normocephalic and atraumatic.  Right Ear: External ear normal.  Left Ear: External ear normal.  Mouth/Throat: Oropharynx is clear and moist. No oropharyngeal exudate.  Eyes: Conjunctivae and EOM are normal. Pupils are equal, round, and reactive to light. No scleral icterus.  Neck: Normal range of motion. Neck supple. Carotid bruit is not present. No tracheal deviation present. No thyromegaly present.  Cardiovascular: Regular rhythm, normal heart sounds and intact distal pulses.  Bradycardia present.  Exam reveals no gallop and no friction rub.   No murmur heard. +1 pitting LE edema b/l with palpable LE varicose veins, soft, NT. No calf TTP. TED stockings intact.  Pulmonary/Chest: Effort normal and breath sounds normal. She has no wheezes. She has no rhonchi. She has no rales. She exhibits no tenderness. Right breast exhibits no inverted nipple, no mass, no nipple discharge, no skin change and no tenderness. Left breast exhibits no inverted nipple, no mass, no nipple discharge, no skin change and no tenderness. Breasts are symmetrical.  Abdominal: Soft. Bowel sounds are normal. She exhibits no distension and no  mass. There is no hepatosplenomegaly. There is no tenderness. There is no rebound and no guarding.  Musculoskeletal: She exhibits edema and tenderness.  Lymphadenopathy:    She has no cervical adenopathy.  Neurological: She is alert and oriented to person, place, and time. She has normal reflexes. Gait abnormal.  Skin: Skin is warm and dry. No rash noted.  Psychiatric: Mood, memory, affect and judgment normal.      MMSE - Mini Mental State Exam 11/06/2014  Orientation to time 5  Orientation to Place 5  Registration 3  Attention/ Calculation 3  Recall 2  Language- name 2 objects 2  Language- repeat 1  Language- follow 3 step command 3  Language- read & follow direction 1  Write a sentence 0  Copy design 1  Total score 26   Recent Results (from the past 2160 hour(s))  [facility] MAMMOGRAPHY     Status: None   Collection Time: 10/23/14 12:00 AM  Result Value Ref Range   HM Mammogram Normal / Negative   TSH     Status: Abnormal   Collection Time: 11/02/14 10:29 AM  Result Value Ref Range   TSH 0.022 (L) 0.450 - 4.500 uIU/mL  CMP  Status: Abnormal   Collection Time: 11/02/14 10:29 AM  Result Value Ref Range   Glucose 94 65 - 99 mg/dL   BUN 31 (H) 8 - 27 mg/dL   Creatinine, Ser 2.44 (H) 0.57 - 1.00 mg/dL   GFR calc non Af Amer 36 (L) >59 mL/min/1.73   GFR calc Af Amer 42 (L) >59 mL/min/1.73   BUN/Creatinine Ratio 24 11 - 26   Sodium 137 134 - 144 mmol/L   Potassium 6.0 (H) 3.5 - 5.2 mmol/L   Chloride 100 97 - 108 mmol/L   CO2 21 18 - 29 mmol/L   Calcium 9.8 8.7 - 10.3 mg/dL   Total Protein 6.8 6.0 - 8.5 g/dL   Albumin 4.1 3.5 - 4.7 g/dL   Globulin, Total 2.7 1.5 - 4.5 g/dL   Albumin/Globulin Ratio 1.5 1.1 - 2.5   Bilirubin Total 0.3 0.0 - 1.2 mg/dL   Alkaline Phosphatase 71 39 - 117 IU/L   AST 18 0 - 40 IU/L   ALT 13 0 - 32 IU/L  T4, Free     Status: Abnormal   Collection Time: 11/02/14 10:29 AM  Result Value Ref Range   Free T4 2.15 (H) 0.82 - 1.77 ng/dL  Hemoglobin W1U      Status: Abnormal   Collection Time: 11/02/14 10:29 AM  Result Value Ref Range   Hgb A1c MFr Bld 6.4 (H) 4.8 - 5.6 %    Comment:          Pre-diabetes: 5.7 - 6.4          Diabetes: >6.4          Glycemic control for adults with diabetes: <7.0    Est. average glucose Bld gHb Est-mCnc 137 mg/dL  Lipid panel     Status: None   Collection Time: 11/02/14 10:29 AM  Result Value Ref Range   Cholesterol, Total 124 100 - 199 mg/dL   Triglycerides 82 0 - 149 mg/dL   HDL 75 >27 mg/dL    Comment: According to ATP-III Guidelines, HDL-C >59 mg/dL is considered a negative risk factor for CHD.    VLDL Cholesterol Cal 16 5 - 40 mg/dL   LDL Calculated 33 0 - 99 mg/dL   Chol/HDL Ratio 1.7 0.0 - 4.4 ratio units    Comment:                                   T. Chol/HDL Ratio                                             Men  Women                               1/2 Avg.Risk  3.4    3.3                                   Avg.Risk  5.0    4.4                                2X Avg.Risk  9.6    7.1  3X Avg.Risk 23.4   11.0         Assessment:        ICD-9-CM ICD-10-CM   1. Well adult exam V70.0 Z00.00   2. Hypothyroidism, unspecified hypothyroidism type - labs s/o overactive 244.9 E03.9 levothyroxine (SYNTHROID, LEVOTHROID) 100 MCG tablet     levothyroxine (SYNTHROID, LEVOTHROID) 125 MCG tablet     TSH     T4, Free     CANCELED: TSH     CANCELED: T4, Free  3. Loss of weight probably due to #2 783.21 R63.4   4. Essential hypertension - stable 401.9 I10   5. Chronic atrial fibrillation - rate controlled 427.31 I48.2   6. Dyslipidemia - stable 272.4 E78.5   7. Diabetic polyneuropathy associated with type 2 diabetes mellitus - stable 250.60 E11.42    357.2    8. Unstable gait - due to #9 781.2 R26.81   9. History of stroke - stable V12.54 Z86.73   10. Postmenopausal estrogen deficiency V49.81 Z78.0 DG Bone Density         Plan:     During the course of the  visit the patient was educated and counseled about appropriate screening and preventive services including:    Influenza vaccine - start in late Sept/early Oct  Tdap vaccine - she will think about it  Bone densitometry screening  Advanced directives: power of attorney for healthcare on file  Diet review for nutrition referral? Yes ____  Not Indicated __x__  Change levothyroxine to take 225 mcg daily.  Repeat thyroid levels in 6 weeks  Follow up with specialists as scheduled  Will call with bone density appt  Follow up in 3 mos for routine visit  Patient Instructions (the written plan) was given to the patient.  Medicare Attestation I have personally reviewed: The patient's medical and social history Their use of alcohol, tobacco or illicit drugs Their current medications and supplements The patient's functional ability including ADLs,fall risks, home safety risks, cognitive, and hearing and visual impairment Diet and physical activities Evidence for depression or mood disorders  The patient's weight, height, and BMI have been recorded in the chart.  I have made referrals, counseling, and provided education to the patient based on review of the above and I have provided the patient with a written personalized care plan for preventive services.      Kirt Boys, DO   11/06/2014       Bertis Ruddy. Ancil Linsey  [facility] [address] [place], [place] [address] [phone] Cell (Monday-Friday 8 AM - 5 PM) [phone] After 5 PM and follow prompts

## 2014-11-06 NOTE — Progress Notes (Signed)
Failed clock drawing  

## 2014-11-06 NOTE — Patient Instructions (Addendum)
Encouraged her to exercise 30-45 minutes 4-5 times per week. Eat a well balanced diet. Avoid smoking. Limit alcohol intake. Wear seatbelt when riding in the car. Wear sun block (SPF >50) when spending extended times outside.  Change levothyroxine to take 225 mcg daily.  Repeat thyroid level in 6 weeks  Follow up with specialists as scheduled  Will call with bone density appt  Follow up in 3 mos for routine visit

## 2014-11-16 ENCOUNTER — Other Ambulatory Visit: Payer: Self-pay | Admitting: *Deleted

## 2014-11-16 MED ORDER — SERTRALINE HCL 100 MG PO TABS: ORAL_TABLET | ORAL | Status: DC

## 2014-11-16 NOTE — Telephone Encounter (Signed)
Patient daughter, vivian requested. Going out of town.

## 2014-11-27 ENCOUNTER — Ambulatory Visit (INDEPENDENT_AMBULATORY_CARE_PROVIDER_SITE_OTHER): Payer: Medicare Other | Admitting: Podiatry

## 2014-11-27 ENCOUNTER — Encounter: Payer: Self-pay | Admitting: Podiatry

## 2014-11-27 ENCOUNTER — Telehealth: Payer: Self-pay | Admitting: *Deleted

## 2014-11-27 VITALS — BP 132/55 | HR 52 | Resp 16

## 2014-11-27 DIAGNOSIS — I248 Other forms of acute ischemic heart disease: Secondary | ICD-10-CM

## 2014-11-27 DIAGNOSIS — B351 Tinea unguium: Secondary | ICD-10-CM

## 2014-11-27 DIAGNOSIS — E1140 Type 2 diabetes mellitus with diabetic neuropathy, unspecified: Secondary | ICD-10-CM | POA: Diagnosis not present

## 2014-11-27 DIAGNOSIS — R0989 Other specified symptoms and signs involving the circulatory and respiratory systems: Secondary | ICD-10-CM | POA: Diagnosis not present

## 2014-11-27 DIAGNOSIS — M2040 Other hammer toe(s) (acquired), unspecified foot: Secondary | ICD-10-CM

## 2014-11-27 DIAGNOSIS — M79676 Pain in unspecified toe(s): Secondary | ICD-10-CM

## 2014-11-27 DIAGNOSIS — E1149 Type 2 diabetes mellitus with other diabetic neurological complication: Secondary | ICD-10-CM

## 2014-11-27 NOTE — Telephone Encounter (Signed)
Ordered ABI for diminished pulses and discolored toes.

## 2014-12-03 ENCOUNTER — Other Ambulatory Visit: Payer: Self-pay | Admitting: Podiatry

## 2014-12-03 DIAGNOSIS — R0989 Other specified symptoms and signs involving the circulatory and respiratory systems: Secondary | ICD-10-CM

## 2014-12-04 ENCOUNTER — Telehealth: Payer: Self-pay | Admitting: *Deleted

## 2014-12-04 NOTE — Progress Notes (Signed)
Patient ID: Shannon [name], female   DOB: 02-28-1924, 81 y.o.   MRN: [id number]  Subjective: 81 year old female presents the office today with complaints of thick, painful, elongated toenails for which she is unable to trim herself. She denies any redness or drainage from the nail sites. She also states that she has hammertoes and second toes crossing over the third digit. She states that her toes for pressure in her shoes which get irritated. She also presents today for measurement of diabetic shoes. No other complaints this time in no acute changes.  Objective: AAO 3, NAD DP/PT pulses decreased bilaterally Protective sensation decreased with Simms Weinstein monofilament Nails are hypertrophic, dystrophic, brittle, discolored, elongated. There is tenderness upon palpation to nails 9. There is no nail on the right third digit. There is no swelling erythema or drainage in the nail sites. The right third digit does appear to be hyperpigmented compared to the remaining digits. There is no pain to the toe and there is no open lesions or drainage. No other open lesions or pre-ulcer lesions identified bilaterally. No open edema, erythema, increase in warmth bilaterally. Hammertoe contractures are present with the second digit overlapping the third toes bilaterally. No pain with calf compression, swelling, warmth, erythema.  Assessment: 81 year old female presents for symptomatic onychomycosis, hammertoes, measurement diabetic shoes with hyperpigmented third toe decreased pulses  Plan: -Treatment options discussed including all alternatives, risks, and complications -Nail sharply debrided 9 without complications/bleeding -Given the hyperpigmentation of the toe and decreased pulses will obtain vascular studies. This is ordered today. -Patient was measured for diabetic shoes today. She also protective style shoe. Discussed with her didn't tell toe box to help take pressure off the hammertoes.   -Follow-up after vascular studies or sooner if any problems are to arise. In the meantime encouraged to call the office with any questions, concerns, change in symptoms.   Shannon [name], DPM

## 2014-12-04 NOTE — Telephone Encounter (Signed)
I spoke with pt's dtr, [name] states pt is scheduled for circulation studies 12/11/2014.

## 2014-12-11 ENCOUNTER — Inpatient Hospital Stay (HOSPITAL_COMMUNITY): Admission: RE | Admit: 2014-12-11 | Payer: Medicare Other | Source: Ambulatory Visit

## 2014-12-14 ENCOUNTER — Ambulatory Visit (HOSPITAL_COMMUNITY)
Admission: RE | Admit: 2014-12-14 | Discharge: 2014-12-14 | Disposition: A | Discharge status: Home / Self Care | Payer: Medicare Other | Source: Ambulatory Visit | Attending: Podiatry | Admitting: Podiatry

## 2014-12-14 DIAGNOSIS — R0989 Other specified symptoms and signs involving the circulatory and respiratory systems: Secondary | ICD-10-CM | POA: Diagnosis not present

## 2014-12-16 ENCOUNTER — Telehealth: Payer: Self-pay | Admitting: *Deleted

## 2014-12-16 NOTE — Telephone Encounter (Signed)
Dr. Jacqualyn Posey states pt's circulation is basically normal, slight abnormality in the big toes, can refer to Dr. Gwenlyn Found or can watch for change to color, temperature or pain sensation, if changes occur can refer to Dr. Gwenlyn Found at pt's request.  I informed pt's dtr, [name] and she spoke to the pt and the pt states she would prefer to wait and observe.

## 2014-12-31 ENCOUNTER — Other Ambulatory Visit: Payer: Self-pay | Admitting: Internal Medicine

## 2015-01-10 ENCOUNTER — Encounter (HOSPITAL_COMMUNITY): Payer: Self-pay | Admitting: Emergency Medicine

## 2015-01-10 ENCOUNTER — Inpatient Hospital Stay (HOSPITAL_COMMUNITY): Payer: Medicare Other

## 2015-01-10 ENCOUNTER — Emergency Department (HOSPITAL_COMMUNITY): Payer: Medicare Other

## 2015-01-10 ENCOUNTER — Inpatient Hospital Stay (HOSPITAL_COMMUNITY)
Admission: EM | Admit: 2015-01-10 | Discharge: 2015-01-27 | DRG: 335 | Disposition: A | Discharge status: Home Health | Payer: Medicare Other | Attending: Internal Medicine | Admitting: Internal Medicine

## 2015-01-10 DIAGNOSIS — R1030 Lower abdominal pain, unspecified: Secondary | ICD-10-CM | POA: Diagnosis not present

## 2015-01-10 DIAGNOSIS — N289 Disorder of kidney and ureter, unspecified: Secondary | ICD-10-CM | POA: Diagnosis not present

## 2015-01-10 DIAGNOSIS — E860 Dehydration: Secondary | ICD-10-CM | POA: Diagnosis not present

## 2015-01-10 DIAGNOSIS — E1140 Type 2 diabetes mellitus with diabetic neuropathy, unspecified: Secondary | ICD-10-CM | POA: Diagnosis not present

## 2015-01-10 DIAGNOSIS — K5669 Other intestinal obstruction: Secondary | ICD-10-CM | POA: Diagnosis not present

## 2015-01-10 DIAGNOSIS — I952 Hypotension due to drugs: Secondary | ICD-10-CM | POA: Diagnosis not present

## 2015-01-10 DIAGNOSIS — Z515 Encounter for palliative care: Secondary | ICD-10-CM | POA: Diagnosis not present

## 2015-01-10 DIAGNOSIS — R1084 Generalized abdominal pain: Secondary | ICD-10-CM

## 2015-01-10 DIAGNOSIS — R55 Syncope and collapse: Secondary | ICD-10-CM | POA: Diagnosis not present

## 2015-01-10 DIAGNOSIS — I129 Hypertensive chronic kidney disease with stage 1 through stage 4 chronic kidney disease, or unspecified chronic kidney disease: Secondary | ICD-10-CM | POA: Diagnosis present

## 2015-01-10 DIAGNOSIS — N183 Chronic kidney disease, stage 3 (moderate): Secondary | ICD-10-CM | POA: Diagnosis present

## 2015-01-10 DIAGNOSIS — E039 Hypothyroidism, unspecified: Secondary | ICD-10-CM | POA: Diagnosis not present

## 2015-01-10 DIAGNOSIS — R109 Unspecified abdominal pain: Secondary | ICD-10-CM | POA: Diagnosis not present

## 2015-01-10 DIAGNOSIS — E119 Type 2 diabetes mellitus without complications: Secondary | ICD-10-CM

## 2015-01-10 DIAGNOSIS — Z0189 Encounter for other specified special examinations: Secondary | ICD-10-CM

## 2015-01-10 DIAGNOSIS — J189 Pneumonia, unspecified organism: Secondary | ICD-10-CM | POA: Diagnosis not present

## 2015-01-10 DIAGNOSIS — I248 Other forms of acute ischemic heart disease: Secondary | ICD-10-CM | POA: Diagnosis present

## 2015-01-10 DIAGNOSIS — G8918 Other acute postprocedural pain: Secondary | ICD-10-CM | POA: Diagnosis not present

## 2015-01-10 DIAGNOSIS — Z6820 Body mass index (BMI) 20.0-20.9, adult: Secondary | ICD-10-CM | POA: Diagnosis not present

## 2015-01-10 DIAGNOSIS — Z8673 Personal history of transient ischemic attack (TIA), and cerebral infarction without residual deficits: Secondary | ICD-10-CM

## 2015-01-10 DIAGNOSIS — R05 Cough: Secondary | ICD-10-CM | POA: Diagnosis not present

## 2015-01-10 DIAGNOSIS — E1151 Type 2 diabetes mellitus with diabetic peripheral angiopathy without gangrene: Secondary | ICD-10-CM | POA: Diagnosis not present

## 2015-01-10 DIAGNOSIS — I10 Essential (primary) hypertension: Secondary | ICD-10-CM | POA: Diagnosis not present

## 2015-01-10 DIAGNOSIS — E876 Hypokalemia: Secondary | ICD-10-CM | POA: Diagnosis not present

## 2015-01-10 DIAGNOSIS — J45909 Unspecified asthma, uncomplicated: Secondary | ICD-10-CM | POA: Diagnosis present

## 2015-01-10 DIAGNOSIS — K565 Intestinal adhesions [bands] with obstruction (postprocedural) (postinfection): Secondary | ICD-10-CM | POA: Diagnosis present

## 2015-01-10 DIAGNOSIS — Z7189 Other specified counseling: Secondary | ICD-10-CM | POA: Diagnosis present

## 2015-01-10 DIAGNOSIS — R7989 Other specified abnormal findings of blood chemistry: Secondary | ICD-10-CM | POA: Diagnosis not present

## 2015-01-10 DIAGNOSIS — I4892 Unspecified atrial flutter: Secondary | ICD-10-CM | POA: Diagnosis present

## 2015-01-10 DIAGNOSIS — Z66 Do not resuscitate: Secondary | ICD-10-CM | POA: Diagnosis present

## 2015-01-10 DIAGNOSIS — R42 Dizziness and giddiness: Secondary | ICD-10-CM | POA: Diagnosis not present

## 2015-01-10 DIAGNOSIS — R778 Other specified abnormalities of plasma proteins: Secondary | ICD-10-CM | POA: Diagnosis not present

## 2015-01-10 DIAGNOSIS — R5381 Other malaise: Secondary | ICD-10-CM | POA: Diagnosis not present

## 2015-01-10 DIAGNOSIS — K219 Gastro-esophageal reflux disease without esophagitis: Secondary | ICD-10-CM | POA: Diagnosis present

## 2015-01-10 DIAGNOSIS — I959 Hypotension, unspecified: Secondary | ICD-10-CM | POA: Diagnosis not present

## 2015-01-10 DIAGNOSIS — Z23 Encounter for immunization: Secondary | ICD-10-CM | POA: Diagnosis not present

## 2015-01-10 DIAGNOSIS — R001 Bradycardia, unspecified: Secondary | ICD-10-CM | POA: Diagnosis not present

## 2015-01-10 DIAGNOSIS — E43 Unspecified severe protein-calorie malnutrition: Secondary | ICD-10-CM | POA: Diagnosis present

## 2015-01-10 DIAGNOSIS — R509 Fever, unspecified: Secondary | ICD-10-CM | POA: Diagnosis not present

## 2015-01-10 DIAGNOSIS — R404 Transient alteration of awareness: Secondary | ICD-10-CM | POA: Diagnosis not present

## 2015-01-10 DIAGNOSIS — E222 Syndrome of inappropriate secretion of antidiuretic hormone: Secondary | ICD-10-CM | POA: Diagnosis not present

## 2015-01-10 DIAGNOSIS — I160 Hypertensive urgency: Secondary | ICD-10-CM | POA: Diagnosis not present

## 2015-01-10 DIAGNOSIS — Z7982 Long term (current) use of aspirin: Secondary | ICD-10-CM

## 2015-01-10 DIAGNOSIS — E1122 Type 2 diabetes mellitus with diabetic chronic kidney disease: Secondary | ICD-10-CM | POA: Diagnosis present

## 2015-01-10 DIAGNOSIS — N179 Acute kidney failure, unspecified: Secondary | ICD-10-CM | POA: Diagnosis not present

## 2015-01-10 DIAGNOSIS — E1159 Type 2 diabetes mellitus with other circulatory complications: Secondary | ICD-10-CM | POA: Diagnosis present

## 2015-01-10 DIAGNOSIS — Y95 Nosocomial condition: Secondary | ICD-10-CM | POA: Diagnosis not present

## 2015-01-10 DIAGNOSIS — R0789 Other chest pain: Secondary | ICD-10-CM | POA: Diagnosis not present

## 2015-01-10 DIAGNOSIS — J449 Chronic obstructive pulmonary disease, unspecified: Secondary | ICD-10-CM | POA: Diagnosis present

## 2015-01-10 DIAGNOSIS — E46 Unspecified protein-calorie malnutrition: Secondary | ICD-10-CM | POA: Diagnosis not present

## 2015-01-10 DIAGNOSIS — K560 Paralytic ileus: Secondary | ICD-10-CM | POA: Diagnosis not present

## 2015-01-10 DIAGNOSIS — F329 Major depressive disorder, single episode, unspecified: Secondary | ICD-10-CM | POA: Diagnosis present

## 2015-01-10 DIAGNOSIS — E1142 Type 2 diabetes mellitus with diabetic polyneuropathy: Secondary | ICD-10-CM | POA: Diagnosis not present

## 2015-01-10 DIAGNOSIS — R059 Cough, unspecified: Secondary | ICD-10-CM

## 2015-01-10 DIAGNOSIS — R140 Abdominal distension (gaseous): Secondary | ICD-10-CM | POA: Diagnosis not present

## 2015-01-10 DIAGNOSIS — D649 Anemia, unspecified: Secondary | ICD-10-CM | POA: Diagnosis present

## 2015-01-10 DIAGNOSIS — E871 Hypo-osmolality and hyponatremia: Secondary | ICD-10-CM | POA: Diagnosis not present

## 2015-01-10 DIAGNOSIS — J690 Pneumonitis due to inhalation of food and vomit: Secondary | ICD-10-CM | POA: Diagnosis not present

## 2015-01-10 DIAGNOSIS — I152 Hypertension secondary to endocrine disorders: Secondary | ICD-10-CM | POA: Diagnosis present

## 2015-01-10 DIAGNOSIS — Z4682 Encounter for fitting and adjustment of non-vascular catheter: Secondary | ICD-10-CM | POA: Diagnosis not present

## 2015-01-10 DIAGNOSIS — K5660 Unspecified intestinal obstruction: Secondary | ICD-10-CM | POA: Diagnosis not present

## 2015-01-10 DIAGNOSIS — E785 Hyperlipidemia, unspecified: Secondary | ICD-10-CM | POA: Diagnosis present

## 2015-01-10 DIAGNOSIS — I2510 Atherosclerotic heart disease of native coronary artery without angina pectoris: Secondary | ICD-10-CM | POA: Diagnosis not present

## 2015-01-10 DIAGNOSIS — K56609 Unspecified intestinal obstruction, unspecified as to partial versus complete obstruction: Secondary | ICD-10-CM | POA: Diagnosis present

## 2015-01-10 HISTORY — DX: Personal history of other diseases of the digestive system: Z87.19

## 2015-01-10 HISTORY — DX: Unspecified asthma, uncomplicated: J45.909

## 2015-01-10 HISTORY — DX: Syndrome of inappropriate secretion of antidiuretic hormone: E22.2

## 2015-01-10 HISTORY — DX: Hypothyroidism, unspecified: E03.9

## 2015-01-10 HISTORY — DX: Personal history of other medical treatment: Z92.89

## 2015-01-10 HISTORY — DX: Major depressive disorder, single episode, unspecified: F32.9

## 2015-01-10 HISTORY — DX: Type 2 diabetes mellitus without complications: E11.9

## 2015-01-10 HISTORY — DX: Chronic obstructive pulmonary disease, unspecified: J44.9

## 2015-01-10 HISTORY — DX: Personal history of peptic ulcer disease: Z87.11

## 2015-01-10 HISTORY — DX: Gastro-esophageal reflux disease without esophagitis: K21.9

## 2015-01-10 HISTORY — DX: Depression, unspecified: F32.A

## 2015-01-10 HISTORY — DX: Anemia, unspecified: D64.9

## 2015-01-10 HISTORY — DX: Unspecified osteoarthritis, unspecified site: M19.90

## 2015-01-10 LAB — COMPREHENSIVE METABOLIC PANEL
ALT: 19 U/L (ref 14–54)
AST: 29 U/L (ref 15–41)
Albumin: 4 g/dL (ref 3.5–5.0)
Alkaline Phosphatase: 75 U/L (ref 38–126)
Anion gap: 13 (ref 5–15)
BUN: 27 mg/dL — ABNORMAL HIGH (ref 6–20)
CO2: 21 mmol/L — ABNORMAL LOW (ref 22–32)
Calcium: 10.3 mg/dL (ref 8.9–10.3)
Chloride: 99 mmol/L — ABNORMAL LOW (ref 101–111)
Creatinine, Ser: 1.12 mg/dL — ABNORMAL HIGH (ref 0.44–1.00)
GFR calc Af Amer: 49 mL/min — ABNORMAL LOW (ref >60)
GFR calc non Af Amer: 42 mL/min — ABNORMAL LOW (ref >60)
Glucose, Bld: 136 mg/dL — ABNORMAL HIGH (ref 65–99)
Potassium: 4 mmol/L (ref 3.5–5.1)
Sodium: 133 mmol/L — ABNORMAL LOW (ref 135–145)
Total Bilirubin: 1 mg/dL (ref 0.3–1.2)
Total Protein: 7.6 g/dL (ref 6.5–8.1)

## 2015-01-10 LAB — CBC
HCT: 34.8 % — ABNORMAL LOW (ref 36.0–46.0)
Hemoglobin: 12.3 g/dL (ref 12.0–15.0)
MCH: 32.2 pg (ref 26.0–34.0)
MCHC: 35.3 g/dL (ref 30.0–36.0)
MCV: 91.1 fL (ref 78.0–100.0)
Platelets: 220 10*3/uL (ref 150–400)
RBC: 3.82 MIL/uL — ABNORMAL LOW (ref 3.87–5.11)
RDW: 14.4 % (ref 11.5–15.5)
WBC: 8.3 10*3/uL (ref 4.0–10.5)

## 2015-01-10 LAB — URINALYSIS, ROUTINE W REFLEX MICROSCOPIC
Bilirubin Urine: NEGATIVE
Glucose, UA: NEGATIVE mg/dL
Hgb urine dipstick: NEGATIVE
Ketones, ur: NEGATIVE mg/dL
Leukocytes, UA: NEGATIVE
Nitrite: NEGATIVE
Protein, ur: 100 mg/dL — AB
Specific Gravity, Urine: 1.017 (ref 1.005–1.030)
Urobilinogen, UA: 0.2 mg/dL (ref 0.0–1.0)
pH: 7.5 (ref 5.0–8.0)

## 2015-01-10 LAB — URINE MICROSCOPIC-ADD ON

## 2015-01-10 LAB — DIFFERENTIAL
Basophils Absolute: 0 10*3/uL (ref 0.0–0.1)
Basophils Relative: 0 %
Eosinophils Absolute: 0 10*3/uL (ref 0.0–0.7)
Eosinophils Relative: 0 %
Lymphocytes Relative: 6 %
Lymphs Abs: 0.5 10*3/uL — ABNORMAL LOW (ref 0.7–4.0)
Monocytes Absolute: 0.5 10*3/uL (ref 0.1–1.0)
Monocytes Relative: 6 %
Neutro Abs: 7.3 10*3/uL (ref 1.7–7.7)
Neutrophils Relative %: 88 %

## 2015-01-10 LAB — GLUCOSE, CAPILLARY
Glucose-Capillary: 141 mg/dL — ABNORMAL HIGH (ref 65–99)
Glucose-Capillary: 134 mg/dL — ABNORMAL HIGH (ref 65–99)
Glucose-Capillary: 129 mg/dL — ABNORMAL HIGH (ref 65–99)
Glucose-Capillary: 116 mg/dL — ABNORMAL HIGH (ref 65–99)

## 2015-01-10 LAB — LIPASE, BLOOD: Lipase: 21 U/L — ABNORMAL LOW (ref 22–51)

## 2015-01-10 LAB — I-STAT CG4 LACTIC ACID, ED: Lactic Acid, Venous: 1.37 mmol/L (ref 0.5–2.0)

## 2015-01-10 MED ORDER — GABAPENTIN 300 MG PO CAPS: 300.0000 mg | ORAL_CAPSULE | Freq: Three times a day (TID) | ORAL | Status: DC

## 2015-01-10 MED ORDER — AMLODIPINE BESYLATE 10 MG PO TABS
10.0000 mg | ORAL_TABLET | Freq: Every day | ORAL | Status: DC
  Administered 2015-01-11 – 2015-01-21 (×11): 10 mg
  Filled 2015-01-10 (×4): qty 1
  Filled 2015-01-10: qty 2
  Filled 2015-01-10 (×8): qty 1

## 2015-01-10 MED ORDER — ALBUTEROL SULFATE (2.5 MG/3ML) 0.083% IN NEBU: 2.5000 mg | INHALATION_SOLUTION | Freq: Four times a day (QID) | RESPIRATORY_TRACT | Status: DC | PRN

## 2015-01-10 MED ORDER — CLONIDINE HCL 0.1 MG PO TABS
0.1000 mg | ORAL_TABLET | Freq: Two times a day (BID) | ORAL | Status: DC
  Administered 2015-01-10 – 2015-01-13 (×6): 0.1 mg
  Filled 2015-01-10 (×7): qty 1

## 2015-01-10 MED ORDER — LIDOCAINE VISCOUS 2 % MT SOLN
OROMUCOSAL | Status: AC
  Filled 2015-01-10: qty 30

## 2015-01-10 MED ORDER — ALBUTEROL SULFATE HFA 108 (90 BASE) MCG/ACT IN AERS: 2.0000 | INHALATION_SPRAY | Freq: Four times a day (QID) | RESPIRATORY_TRACT | Status: DC | PRN

## 2015-01-10 MED ORDER — FLUTICASONE PROPIONATE 50 MCG/ACT NA SUSP
2.0000 | Freq: Every day | NASAL | Status: DC
  Administered 2015-01-10 – 2015-01-27 (×17): 2 via NASAL
  Filled 2015-01-10 (×3): qty 16

## 2015-01-10 MED ORDER — MORPHINE SULFATE (PF) 4 MG/ML IV SOLN: 4.0000 mg | INTRAVENOUS | Status: DC | PRN

## 2015-01-10 MED ORDER — HYDRALAZINE HCL 20 MG/ML IJ SOLN
10.0000 mg | Freq: Three times a day (TID) | INTRAMUSCULAR | Status: DC | PRN
  Administered 2015-01-11: 5 mg via INTRAVENOUS
  Administered 2015-01-11 – 2015-01-24 (×9): 10 mg via INTRAVENOUS
  Filled 2015-01-10 (×9): qty 1

## 2015-01-10 MED ORDER — GABAPENTIN 300 MG PO CAPS
300.0000 mg | ORAL_CAPSULE | Freq: Three times a day (TID) | ORAL | Status: DC
  Administered 2015-01-10 – 2015-01-13 (×8): 300 mg
  Filled 2015-01-10 (×9): qty 1

## 2015-01-10 MED ORDER — ISOSORBIDE MONONITRATE 20 MG PO TABS
20.0000 mg | ORAL_TABLET | Freq: Every day | ORAL | Status: DC
  Administered 2015-01-10 – 2015-01-12 (×3): 20 mg
  Filled 2015-01-10 (×3): qty 1

## 2015-01-10 MED ORDER — SERTRALINE HCL 100 MG PO TABS: 100.0000 mg | ORAL_TABLET | Freq: Every day | ORAL | Status: DC

## 2015-01-10 MED ORDER — SODIUM CHLORIDE 0.9 % IV SOLN: INTRAVENOUS | Status: DC

## 2015-01-10 MED ORDER — SALINE SPRAY 0.65 % NA SOLN
1.0000 | NASAL | Status: DC | PRN
  Administered 2015-01-10 – 2015-01-20 (×2): 1 via NASAL
  Filled 2015-01-10 (×2): qty 44

## 2015-01-10 MED ORDER — AMOXICILLIN 250 MG/5ML PO SUSR
250.0000 mg | Freq: Two times a day (BID) | ORAL | Status: DC
  Filled 2015-01-10: qty 5

## 2015-01-10 MED ORDER — METOPROLOL TARTRATE 1 MG/ML IV SOLN
2.5000 mg | Freq: Four times a day (QID) | INTRAVENOUS | Status: DC
  Administered 2015-01-10 – 2015-01-12 (×9): 2.5 mg via INTRAVENOUS
  Filled 2015-01-10 (×9): qty 5

## 2015-01-10 MED ORDER — ASPIRIN 81 MG PO CHEW
81.0000 mg | CHEWABLE_TABLET | Freq: Every day | ORAL | Status: DC
  Administered 2015-01-10 – 2015-01-21 (×12): 81 mg
  Filled 2015-01-10 (×13): qty 1

## 2015-01-10 MED ORDER — ACETAMINOPHEN 160 MG/5ML PO SOLN
650.0000 mg | Freq: Four times a day (QID) | ORAL | Status: DC | PRN
  Administered 2015-01-11 – 2015-01-12 (×2): 650 mg
  Filled 2015-01-10 (×2): qty 20.3

## 2015-01-10 MED ORDER — SERTRALINE HCL 100 MG PO TABS
100.0000 mg | ORAL_TABLET | Freq: Every day | ORAL | Status: DC
  Administered 2015-01-10 – 2015-01-21 (×12): 100 mg
  Filled 2015-01-10 (×13): qty 1

## 2015-01-10 MED ORDER — ONDANSETRON HCL 4 MG/2ML IJ SOLN: 4.0000 mg | Freq: Three times a day (TID) | INTRAMUSCULAR | Status: AC | PRN

## 2015-01-10 MED ORDER — ISOSORBIDE MONONITRATE 10 MG PO TABS
15.0000 mg | ORAL_TABLET | Freq: Two times a day (BID) | ORAL | Status: DC
  Filled 2015-01-10: qty 1.5

## 2015-01-10 MED ORDER — ASPIRIN EC 81 MG PO TBEC: 81.0000 mg | DELAYED_RELEASE_TABLET | Freq: Every morning | ORAL | Status: DC

## 2015-01-10 MED ORDER — SODIUM CHLORIDE 1 G PO TABS
1.0000 g | ORAL_TABLET | Freq: Three times a day (TID) | ORAL | Status: DC
  Filled 2015-01-10 (×3): qty 1

## 2015-01-10 MED ORDER — ONDANSETRON HCL 4 MG/2ML IJ SOLN: 4.0000 mg | Freq: Once | INTRAMUSCULAR | Status: DC

## 2015-01-10 MED ORDER — AMOXICILLIN 250 MG/5ML PO SUSR
500.0000 mg | Freq: Three times a day (TID) | ORAL | Status: DC
  Administered 2015-01-10 – 2015-01-13 (×9): 500 mg
  Filled 2015-01-10 (×12): qty 10

## 2015-01-10 MED ORDER — LEVOTHYROXINE SODIUM 100 MCG PO TABS: 100.0000 ug | ORAL_TABLET | Freq: Every day | ORAL | Status: DC

## 2015-01-10 MED ORDER — METOPROLOL SUCCINATE ER 25 MG PO TB24: 25.0000 mg | ORAL_TABLET | Freq: Every day | ORAL | Status: DC

## 2015-01-10 MED ORDER — CLONIDINE HCL 0.1 MG PO TABS: 0.1000 mg | ORAL_TABLET | Freq: Two times a day (BID) | ORAL | Status: DC

## 2015-01-10 MED ORDER — AMLODIPINE BESYLATE 10 MG PO TABS: 10.0000 mg | ORAL_TABLET | Freq: Every day | ORAL | Status: DC

## 2015-01-10 MED ORDER — AMOXICILLIN 250 MG/5ML PO SUSR
250.0000 mg | Freq: Two times a day (BID) | ORAL | Status: DC
Start: 2015-01-10 — End: 2015-01-10
  Filled 2015-01-10: qty 5

## 2015-01-10 MED ORDER — IOHEXOL 300 MG/ML  SOLN
25.0000 mL | Freq: Once | INTRAMUSCULAR | Status: DC | PRN
  Administered 2015-01-10: 25 mL via ORAL
  Filled 2015-01-10: qty 30

## 2015-01-10 MED ORDER — HYDRALAZINE HCL 100 MG PO TABS: 100.0000 mg | ORAL_TABLET | Freq: Three times a day (TID) | ORAL | Status: DC

## 2015-01-10 MED ORDER — LATANOPROST 0.005 % OP SOLN
1.0000 [drp] | Freq: Every day | OPHTHALMIC | Status: DC
  Administered 2015-01-11 – 2015-01-26 (×16): 1 [drp] via OPHTHALMIC
  Filled 2015-01-10 (×4): qty 2.5

## 2015-01-10 MED ORDER — LEVOTHYROXINE SODIUM 100 MCG IV SOLR
112.5000 ug | Freq: Every day | INTRAVENOUS | Status: DC
  Administered 2015-01-10 – 2015-01-24 (×15): 112.5 ug via INTRAVENOUS
  Filled 2015-01-10 (×17): qty 10

## 2015-01-10 MED ORDER — PANTOPRAZOLE SODIUM 40 MG PO TBEC: 40.0000 mg | DELAYED_RELEASE_TABLET | Freq: Two times a day (BID) | ORAL | Status: DC

## 2015-01-10 MED ORDER — HEPARIN SODIUM (PORCINE) 5000 UNIT/ML IJ SOLN
5000.0000 [IU] | Freq: Three times a day (TID) | INTRAMUSCULAR | Status: DC
  Administered 2015-01-10 – 2015-01-19 (×27): 5000 [IU] via SUBCUTANEOUS
  Filled 2015-01-10 (×26): qty 1

## 2015-01-10 MED ORDER — SODIUM CHLORIDE 0.9 % IV SOLN: 1000.0000 mL | INTRAVENOUS | Status: DC

## 2015-01-10 MED ORDER — MOMETASONE FURO-FORMOTEROL FUM 100-5 MCG/ACT IN AERO
2.0000 | INHALATION_SPRAY | Freq: Two times a day (BID) | RESPIRATORY_TRACT | Status: DC
  Administered 2015-01-10 – 2015-01-27 (×31): 2 via RESPIRATORY_TRACT
  Filled 2015-01-10 (×3): qty 8.8

## 2015-01-10 MED ORDER — ISOSORBIDE MONONITRATE ER 30 MG PO TB24: 30.0000 mg | ORAL_TABLET | Freq: Every day | ORAL | Status: DC

## 2015-01-10 MED ORDER — LEVOTHYROXINE SODIUM 125 MCG PO TABS: 125.0000 ug | ORAL_TABLET | Freq: Every day | ORAL | Status: DC

## 2015-01-10 MED ORDER — MORPHINE SULFATE (PF) 4 MG/ML IV SOLN
4.0000 mg | Freq: Once | INTRAVENOUS | Status: AC
  Administered 2015-01-10: 4 mg via INTRAVENOUS
  Filled 2015-01-10: qty 1

## 2015-01-10 MED ORDER — ISOSORBIDE MONONITRATE 20 MG PO TABS
10.0000 mg | ORAL_TABLET | Freq: Every day | ORAL | Status: DC
  Administered 2015-01-11 – 2015-01-13 (×3): 10 mg
  Filled 2015-01-10 (×4): qty 1

## 2015-01-10 MED ORDER — POLYVINYL ALCOHOL 1.4 % OP SOLN
1.0000 [drp] | Freq: Four times a day (QID) | OPHTHALMIC | Status: DC
  Administered 2015-01-10 – 2015-01-27 (×63): 1 [drp] via OPHTHALMIC
  Filled 2015-01-10 (×5): qty 15

## 2015-01-10 MED ORDER — MORPHINE SULFATE (PF) 2 MG/ML IV SOLN
2.0000 mg | INTRAVENOUS | Status: DC | PRN
  Administered 2015-01-10 – 2015-01-12 (×7): 2 mg via INTRAVENOUS
  Filled 2015-01-10 (×7): qty 1

## 2015-01-10 MED ORDER — HYDRALAZINE HCL 50 MG PO TABS
100.0000 mg | ORAL_TABLET | Freq: Three times a day (TID) | ORAL | Status: DC
  Administered 2015-01-10 – 2015-01-13 (×8): 100 mg
  Filled 2015-01-10 (×18): qty 2

## 2015-01-10 MED ORDER — IOHEXOL 300 MG/ML  SOLN
75.0000 mL | Freq: Once | INTRAMUSCULAR | Status: AC | PRN
  Administered 2015-01-10: 75 mL via INTRAVENOUS

## 2015-01-10 MED ORDER — ATORVASTATIN CALCIUM 10 MG PO TABS: 10.0000 mg | ORAL_TABLET | Freq: Every day | ORAL | Status: DC

## 2015-01-10 MED ORDER — HYDRALAZINE HCL 20 MG/ML IJ SOLN
10.0000 mg | Freq: Three times a day (TID) | INTRAMUSCULAR | Status: DC
Start: 2015-01-10 — End: 2015-01-10
  Administered 2015-01-10: 10 mg via INTRAVENOUS
  Filled 2015-01-10: qty 1

## 2015-01-10 MED ORDER — ACETAMINOPHEN 325 MG PO TABS: 650.0000 mg | ORAL_TABLET | Freq: Four times a day (QID) | ORAL | Status: DC | PRN

## 2015-01-10 MED ORDER — SODIUM CHLORIDE 0.9 % IV SOLN
INTRAVENOUS | Status: DC
  Administered 2015-01-10 (×2): via INTRAVENOUS

## 2015-01-10 MED ORDER — METOCLOPRAMIDE HCL 5 MG/ML IJ SOLN
10.0000 mg | Freq: Once | INTRAMUSCULAR | Status: AC
  Administered 2015-01-10: 10 mg via INTRAVENOUS
  Filled 2015-01-10: qty 2

## 2015-01-10 MED ORDER — POLYETHYL GLYCOL-PROPYL GLYCOL 0.4-0.3 % OP SOLN: 1.0000 [drp] | Freq: Four times a day (QID) | OPHTHALMIC | Status: DC

## 2015-01-10 MED ORDER — HYDRALAZINE HCL 50 MG PO TABS: 100.0000 mg | ORAL_TABLET | Freq: Three times a day (TID) | ORAL | Status: DC

## 2015-01-10 MED ORDER — SODIUM CHLORIDE 0.9 % IV SOLN
1000.0000 mL | Freq: Once | INTRAVENOUS | Status: AC
  Administered 2015-01-10: 1000 mL via INTRAVENOUS

## 2015-01-10 MED ORDER — CYCLOSPORINE 0.05 % OP EMUL
1.0000 [drp] | Freq: Two times a day (BID) | OPHTHALMIC | Status: DC
  Administered 2015-01-10 – 2015-01-13 (×7): 1 [drp] via OPHTHALMIC
  Administered 2015-01-14: 11:00:00 via OPHTHALMIC
  Administered 2015-01-14 – 2015-01-27 (×25): 1 [drp] via OPHTHALMIC
  Filled 2015-01-10 (×44): qty 1

## 2015-01-10 MED ORDER — SODIUM CHLORIDE 1 G PO TABS
1.0000 g | ORAL_TABLET | Freq: Three times a day (TID) | ORAL | Status: DC
  Administered 2015-01-10 – 2015-01-13 (×8): 1 g
  Filled 2015-01-10 (×10): qty 1

## 2015-01-10 MED ORDER — PANTOPRAZOLE SODIUM 40 MG IV SOLR
40.0000 mg | Freq: Two times a day (BID) | INTRAVENOUS | Status: DC
  Administered 2015-01-10 – 2015-01-24 (×29): 40 mg via INTRAVENOUS
  Filled 2015-01-10 (×32): qty 40

## 2015-01-10 MED ORDER — DIPHENHYDRAMINE HCL 50 MG/ML IJ SOLN
25.0000 mg | Freq: Once | INTRAMUSCULAR | Status: AC
  Administered 2015-01-10: 25 mg via INTRAVENOUS
  Filled 2015-01-10: qty 1

## 2015-01-10 MED ORDER — PANTOPRAZOLE SODIUM 40 MG IV SOLR: 40.0000 mg | Freq: Every day | INTRAVENOUS | Status: DC

## 2015-01-10 NOTE — Progress Notes (Addendum)
PROGRESS NOTE    Shannon [name] WUJ:[id number] DOB: 09-01-1923 DOA: 01/10/2015 PCP: Kirt Boys, DO  HPI/Brief narrative 81 y.o. female with history of remote gastrojejunostomy & suspected vagotomy, status post GJ anastomosis resection and GJ revision after perforation of anastomotic ulcer 09/2013, G00 01/2014 with EGD 2: #1 with no anatomic outlet obstruction and normal looking pylorus/duodenum, #2: To breakup bezoar, CT 03/12/14: Recurrent bezoar, thickened pyloric channel, EGD 03/19/14: Tortuous/narrowed anatomy at prepylorus/pylorus but no stricture or ulcer, UGI/SBFT 03/25/14: Normal, CT scan 05/21/14: Gastric distention, stomach filled with fluid/food and thickened pyloric wall, barium swallow 05/22/14: Small esophageal hernia, barium trickled into duodenum, eventually assessed to have functional gastric outlet obstruction (follows with [facility]) presented with 4 day history of progressively worsening abdominal pain. Last BM-small one on 10/8, no flatus since 4 days. In ED, CT abdomen suggestive of PSBO versus ileus. Multiple (3) attempts to place NG tube on floor unsuccessful. IR consulted for fluoroscopic guided NG tube placement. CCS consulted. She had teeth pulled on Wed this week, pain control was with Tylenol 3 but she stopped this after Wed.   Assessment/Plan:  PSBO versus ileus - Treat supportively with bowel rest (NPO, NG tube by IR), IV fluids, increased activity and minimize narcotics - Serial clinical exam and abdominal x-rays - Gen. surgery consulted. - IV PPI.  Essential hypertension  - Mildly uncontrolled  - Probably difficult to control given polypharmacy that she is on at home. NG tube inserted by IR. Continue amlodipine, clonidine, hydralazine, nitrates via NG tube with temporary clamping and Toprol-XL changed to scheduled IV. PRN IV hydralazine. Monitor closely.  Type II DM - Not on meds at home. SSI.  Hypothyroid - IV Synthroid  Dehydration with  hyponatremia - Secondary to poor oral intake and GI losses - Brief IV normal saline hydration - ? History of SIADH  Stage III chronic kidney disease - Creatinine is at baseline  History of CVA - Continue aspirin  Seizure history - Does not seem to be on medications at home for this.     DVT prophylaxis: Subcutaneous heparin  Code Status: Full Family Communication: None at bedside Disposition Plan: DC home when medically stable    Consultants:  Gen. Surgery   Procedures:  NG tube   Antibiotics:  None    Subjective: Abdominal pain is better and rates it at 3/10 generalized. Last BM small one on 10/8. No flatus for 4 days. As per nursing, 3 attempts to pass NG tube unsuccessful. Total of 3.2 L vomited since arrival.  Objective: Filed Vitals:   01/10/15 0206 01/10/15 0215 01/10/15 0230 01/10/15 0500  BP: 155/71 149/55 174/59 176/78  Pulse: 73 68 74 80  Temp:      TempSrc:      Resp: 16   16  SpO2: 98% 94% 95% 98%    Intake/Output Summary (Last 24 hours) at 01/10/15 0936 Last data filed at 01/10/15 9562  Gross per 24 hour  Intake      0 ml  Output    275 ml  Net   -275 ml   There were no vitals filed for this visit.   Exam:  General exam: Pleasant elderly female lying comfortably supine in bed. Oral mucosa dry.  Respiratory system: Clear. No increased work of breathing. Cardiovascular system: S1 & S2 heard, RRR. No JVD, murmurs, gallops, clicks or pedal edema. Gastrointestinal system: abdomen minimally distended, midline laparotomy scar, generalized mild tenderness without peritoneal signs and diminished bowel sounds. Central nervous system:  Alert and oriented. No focal neurological deficits. Extremities: Symmetric 5 x 5 power.   Data Reviewed: Basic Metabolic Panel:  Recent Labs Lab 01/10/15 0131  NA 133*  K 4.0  CL 99*  CO2 21*  GLUCOSE 136*  BUN 27*  CREATININE 1.12*  CALCIUM 10.3   Liver Function Tests:  Recent Labs Lab  01/10/15 0131  AST 29  ALT 19  ALKPHOS 75  BILITOT 1.0  PROT 7.6  ALBUMIN 4.0    Recent Labs Lab 01/10/15 0131  LIPASE 21*   No results for input(s): AMMONIA in the last 168 hours. CBC:  Recent Labs Lab 01/10/15 0131  WBC 8.3  NEUTROABS 7.3  HGB 12.3  HCT 34.8*  MCV 91.1  PLT 220   Cardiac Enzymes: No results for input(s): CKTOTAL, CKMB, CKMBINDEX, TROPONINI in the last 168 hours. BNP (last 3 results)  Recent Labs  01/17/14 2116  PROBNP 961.6*   CBG:  Recent Labs Lab 01/10/15 0754  GLUCAP 141*    No results found for this or any previous visit (from the past 240 hour(s)).       Studies: Ct Abdomen Pelvis W Contrast  01/10/2015   CLINICAL DATA:  Acute onset of nausea, vomiting and generalized abdominal pain. Initial encounter.  EXAM: CT ABDOMEN AND PELVIS WITH CONTRAST  TECHNIQUE: Multidetector CT imaging of the abdomen and pelvis was performed using the standard protocol following bolus administration of intravenous contrast.  CONTRAST:  75mL OMNIPAQUE IOHEXOL 300 MG/ML  SOLN  COMPARISON:  CT of the abdomen and pelvis performed 05/21/2014  FINDINGS: Minimal right basilar atelectasis is noted. Calcification is noted at the aortic valve.  There is distention of multiple small bowel loops to 3.4 cm in maximal diameter. Trace associated free fluid is seen. Multiple foci of decompression are seen, with decompression of the distal ileum. This likely reflects small bowel dysmotility, though partial small-bowel obstruction cannot be excluded. A small bowel anastomosis at the left mid abdomen is grossly unremarkable.  There is slight extension of a wall of the transverse colon into a tiny right periumbilical hernia, without evidence for incarceration. A tiny left periumbilical hernia is also seen, containing only fat.  Scattered small hypodensities within the liver are nonspecific but may reflect small cysts. The spleen is unremarkable in appearance. The patient is status  post cholecystectomy, with clips noted at the gallbladder fossa. The pancreas and adrenal glands are unremarkable.  Tiny bilateral renal cysts are seen. The kidneys are otherwise unremarkable. There is no evidence of hydronephrosis. No renal or ureteral stones are seen. No perinephric stranding is appreciated.  The stomach is within normal limits. No acute vascular abnormalities are seen. Diffuse calcification is noted along the abdominal aorta and its branches.  The appendix is not definitely seen; there is no evidence for appendicitis. Scattered diverticulosis is noted along the entirety of the colon, without evidence of diverticulitis.  The bladder is mildly distended and grossly unremarkable. The uterus is unremarkable in appearance. The right ovary is unremarkable in appearance. No suspicious adnexal masses are seen. No inguinal lymphadenopathy is seen.  No acute osseous abnormalities are identified. There is mild chronic compression deformity of vertebral body T11.  IMPRESSION: 1. Distention of multiple small bowel loops to 3.4 cm in maximal diameter, with trace associated free fluid. Multiple foci of decompression noted along the small bowel, with decompression of the distal ileum. This likely reflects some degree of small bowel dysmotility, though partial small bowel obstruction cannot be excluded. 2. Slight  extension of the wall of the transverse colon into a tiny right periumbilical hernia, without evidence for incarceration. Tiny left periumbilical hernia also seen, containing only fat. 3. Diffuse calcification along the abdominal aorta and its branches. 4. Scattered diverticulosis along the entirety of the colon, without evidence of diverticulitis. 5. Calcification at the aortic valve.   Electronically Signed   By: Roanna Raider M.D.   On: 01/10/2015 03:58        Scheduled Meds: . amLODipine  10 mg Oral Daily  . aspirin EC  81 mg Oral q morning - 10a  . cloNIDine  0.1 mg Oral BID  .  cycloSPORINE  1 drop Both Eyes BID  . gabapentin  300 mg Oral TID  . heparin  5,000 Units Subcutaneous 3 times per day  . hydrALAZINE  10 mg Intravenous 3 times per day  . isosorbide mononitrate  30 mg Oral Daily  . latanoprost  1 drop Both Eyes QHS  . levothyroxine  112.5 mcg Intravenous QAC breakfast  . metoprolol  2.5 mg Intravenous 4 times per day  . mometasone-formoterol  2 puff Inhalation BID  . pantoprazole (PROTONIX) IV  40 mg Intravenous QHS  . polyvinyl alcohol  1 drop Both Eyes QID  . sertraline  100 mg Oral Daily  . sodium chloride  1 g Oral TID   Continuous Infusions: . sodium chloride 100 mL/hr at 01/10/15 0932    Principal Problem:   SBO (small bowel obstruction) (HCC) Active Problems:   AP (abdominal pain)   SIADH (syndrome of inappropriate ADH production) (HCC)    Time spent: 40 minutes.    Marcellus Scott, MD, FACP, FHM. [facility] Pager [phone]  If 7PM-7AM, please contact night-coverage [email or web address] Password [email or web address] 01/10/2015, 9:36 AM    LOS: 0 days

## 2015-01-10 NOTE — H&P (Addendum)
[facility] History and Physical  Shannon [name] UXL:[id number] DOB: Oct 13, 1923 DOA: 01/10/2015  Referring physician: EDP PCP: Kirt Boys, DO   Chief Complaint: N/V   HPI: Harlin Vanderkooi is a 81 y.o. female with h/o gastric outlet obstruction, SBO, sequela of a perforated pyloric / duodenal ulcer that she had during summer 2015.  Patient presents to the ED with c/o gradually worsening, generalized abdominal pain.  Symptoms onset 1 day ago.  No BM since Thursday at home and unable to pass gas either.  She had teeth pulled on Wed this week, pain control was with Tylenol 3 but she stopped this after Wed.  Review of Systems: Systems reviewed.  As above, otherwise negative  Past Medical History  Diagnosis Date  . Pneumonia   . Thyroid disease   . Seizures (HCC)   . Stroke (HCC)   . Hyperlipidemia   . Hypertension   . Diabetes mellitus without complication (HCC)   . Small bowel obstruction (HCC)   . Ventral hernia with bowel obstruction   . Asthma   . Renal insufficiency    Past Surgical History  Procedure Laterality Date  . Exploratory laparotomy  09-07-2013  . Cholecystectomy    . Tubal ligation    . Colectomy    . Hernia repair  2015  . Esophagogastroduodenoscopy N/A 01/19/2014    Procedure: ESOPHAGOGASTRODUODENOSCOPY (EGD);  Surgeon: Hilarie Fredrickson, MD;  Location: [facility];  Service: Endoscopy;  Laterality: N/A;  . Esophagogastroduodenoscopy N/A 01/20/2014    Procedure: ESOPHAGOGASTRODUODENOSCOPY (EGD);  Surgeon: Hilarie Fredrickson, MD;  Location: [facility];  Service: Endoscopy;  Laterality: N/A;  . Esophagogastroduodenoscopy N/A 03/19/2014    Procedure: ESOPHAGOGASTRODUODENOSCOPY (EGD);  Surgeon: Rachael Fee, MD;  Location: [facility];  Service: Endoscopy;  Laterality: N/A;   Social History:  reports that she has never smoked. She does not have any smokeless tobacco history on file. She reports that she does not drink alcohol or use illicit drugs.  No Known  Allergies  Family History  Problem Relation Age of Onset  . Stroke Mother   . Heart attack Neg Hx   . Hypertension Mother   . Diabetes Brother      Prior to Admission medications   Medication Sig Start Date End Date Taking? Authorizing Provider  acetaminophen (TYLENOL) 325 MG tablet Take 650 mg by mouth every 6 (six) hours as needed for mild pain or fever.   Yes Historical Provider, MD  ADVAIR DISKUS 250-50 MCG/DOSE AEPB INHALE 1 PUFF INTO THE LUNGS 2 (TWO) TIMES DAILY AS NEEDED FOR SHORTNESS OF BREATH 12/01/14  Yes Historical Provider, MD  albuterol (PROVENTIL HFA;VENTOLIN HFA) 108 (90 BASE) MCG/ACT inhaler Inhale 2 puffs into the lungs every 6 (six) hours as needed for wheezing or shortness of breath. 04/08/14  Yes Lars Masson, MD  amLODipine (NORVASC) 10 MG tablet Take 1 tablet (10 mg total) by mouth daily. For HTN 11/04/14  Yes Kirt Boys, DO  aspirin EC 81 MG tablet Take 81 mg by mouth every morning.   Yes Historical Provider, MD  atorvastatin (LIPITOR) 10 MG tablet TAKE 1 TABLET BY MOUTH EVERY DAY 09/11/14  Yes Kirt Boys, DO  b complex vitamins tablet Take 1 tablet by mouth 2 (two) times daily.   Yes Historical Provider, MD  calcium-vitamin D (OSCAL WITH D) 500-200 MG-UNIT per tablet Take 1 tablet by mouth daily with breakfast.    Yes Historical Provider, MD  chlorhexidine (PERIDEX) 0.12 % solution 15 mLs by Mouth Rinse  route 2 (two) times daily. 03/26/14  Yes Alison Murray, MD  cloNIDine (CATAPRES) 0.1 MG tablet Take 1 tablet (0.1 mg total) by mouth 2 (two) times daily. 07/13/14  Yes Lars Masson, MD  feeding supplement, GLUCERNA SHAKE, (GLUCERNA SHAKE) LIQD Take 237 mLs by mouth 3 (three) times daily between meals. 05/30/14  Yes Simbiso Ranga, MD  fluticasone (FLONASE) 50 MCG/ACT nasal spray Place 2 sprays into both nostrils daily. 08/07/14  Yes Kirt Boys, DO  gabapentin (NEURONTIN) 300 MG capsule Take 1 capsule (300 mg total) by mouth 3 (three) times daily. 09/23/14  Yes  Kirt Boys, DO  hydrALAZINE (APRESOLINE) 100 MG tablet TAKE 1 TABLET BY MOUTH 3 TIMES A DAY 06/22/14  Yes Kirt Boys, DO  isosorbide mononitrate (IMDUR) 30 MG 24 hr tablet Take one tablet by mouth once daily. If Chest Pains occur increase to two tablets daily. 07/22/14  Yes Kirt Boys, DO  L-Methylfolate-Algae-B12-B6 [facility]) 3-90.314-2-35 MG CAPS TAKE 1 CAPSULE BY MOUTH ONCE DAILY 01/01/15  Yes Kirt Boys, DO  lactulose (CHRONULAC) 10 GM/15ML solution Take 15 mLs (10 g total) by mouth daily as needed for mild constipation. 06/02/14  Yes Mahima Pandey, MD  latanoprost (XALATAN) 0.005 % ophthalmic solution Place 1 drop into both eyes at bedtime. 06/02/14  Yes Mahima Pandey, MD  levothyroxine (SYNTHROID, LEVOTHROID) 100 MCG tablet Take 1 tablet (100 mcg total) by mouth daily. Take with dose (total ) 11/06/14  Yes Kirt Boys, DO  levothyroxine (SYNTHROID, LEVOTHROID) 125 MCG tablet Take 1 tablet (125 mcg total) by mouth daily before breakfast. Take with dose (total ) 11/06/14  Yes Kirt Boys, DO  metoprolol succinate (TOPROL-XL) 25 MG 24 hr tablet Take 1 tablet (25 mg total) by mouth daily. 09/23/14  Yes Kirt Boys, DO  Multiple Vitamins-Minerals (MULTIVITAMIN WITH MINERALS) tablet Take 1 tablet by mouth every morning.    Yes Historical Provider, MD  pantoprazole (PROTONIX) 40 MG tablet TAKE 1 TABLET BY MOUTH TWICE A DAY 08/07/14  Yes Kirt Boys, DO  Polyethyl Glycol-Propyl Glycol 0.4-0.3 % SOLN Place 1 drop into both eyes 4 (four) times daily.    Yes Historical Provider, MD  polyethylene glycol (MIRALAX / GLYCOLAX) packet Take 17 g by mouth 2 (two) times daily.   Yes Historical Provider, MD  RESTASIS 0.05 % ophthalmic emulsion USE 1 DROP INTO BOTH EYES TWICE DAILY 10/19/14  Yes Kirt Boys, DO  sennosides-docusate sodium (SENOKOT-S) 8.6-50 MG tablet Take 1 tablet by mouth 2 (two) times daily.    Yes Historical Provider, MD  sertraline (ZOLOFT) 100 MG tablet Take one  tablet by mouth once daily for depression 11/16/14  Yes Kirt Boys, DO  sodium chloride 1 G tablet TAKE 1 TABLET BY MOUTH THREE TIMES DAILY 11/04/14  Yes Kirt Boys, DO  traMADol (ULTRAM) 50 MG tablet Take by mouth 2 (two) times daily as needed for moderate pain.   Yes Historical Provider, MD   Physical Exam: Filed Vitals:   01/10/15 0230  BP: 174/59  Pulse: 74  Temp:   Resp:     BP 174/59 mmHg  Pulse 74  Temp(Src) 98.7 F (37.1 C) (Oral)  Resp 16  SpO2 95%  General Appearance:    Alert, oriented, no distress, appears stated age  Head:    Normocephalic, atraumatic  Eyes:    PERRL, EOMI, sclera non-icteric        Nose:   Nares without drainage or epistaxis. Mucosa, turbinates normal  Throat:   Moist mucous  membranes. Oropharynx without erythema or exudate.  Neck:   Supple. No carotid bruits.  No thyromegaly.  No lymphadenopathy.   Back:     No CVA tenderness, no spinal tenderness  Lungs:     Clear to auscultation bilaterally, without wheezes, rhonchi or rales  Chest wall:    No tenderness to palpitation  Heart:    Regular rate and rhythm without murmurs, gallops, rubs  Abdomen:     Soft, non-tender, nondistended, normal bowel sounds, no organomegaly  Genitalia:    deferred  Rectal:    deferred  Extremities:   No clubbing, cyanosis or edema.  Pulses:   2+ and symmetric all extremities  Skin:   Skin color, texture, turgor normal, no rashes or lesions  Lymph nodes:   Cervical, supraclavicular, and axillary nodes normal  Neurologic:   CNII-XII intact. Normal strength, sensation and reflexes      throughout    Labs on Admission:  Basic Metabolic Panel:  Recent Labs Lab 01/10/15 0131  NA 133*  K 4.0  CL 99*  CO2 21*  GLUCOSE 136*  BUN 27*  CREATININE 1.12*  CALCIUM 10.3   Liver Function Tests:  Recent Labs Lab 01/10/15 0131  AST 29  ALT 19  ALKPHOS 75  BILITOT 1.0  PROT 7.6  ALBUMIN 4.0    Recent Labs Lab 01/10/15 0131  LIPASE 21*   No results  for input(s): AMMONIA in the last 168 hours. CBC:  Recent Labs Lab 01/10/15 0131  WBC 8.3  NEUTROABS 7.3  HGB 12.3  HCT 34.8*  MCV 91.1  PLT 220   Cardiac Enzymes: No results for input(s): CKTOTAL, CKMB, CKMBINDEX, TROPONINI in the last 168 hours.  BNP (last 3 results)  Recent Labs  01/17/14 2116  PROBNP 961.6*   CBG: No results for input(s): GLUCAP in the last 168 hours.  Radiological Exams on Admission: Ct Abdomen Pelvis W Contrast  01/10/2015   CLINICAL DATA:  Acute onset of nausea, vomiting and generalized abdominal pain. Initial encounter.  EXAM: CT ABDOMEN AND PELVIS WITH CONTRAST  TECHNIQUE: Multidetector CT imaging of the abdomen and pelvis was performed using the standard protocol following bolus administration of intravenous contrast.  CONTRAST:  75mL OMNIPAQUE IOHEXOL 300 MG/ML  SOLN  COMPARISON:  CT of the abdomen and pelvis performed 05/21/2014  FINDINGS: Minimal right basilar atelectasis is noted. Calcification is noted at the aortic valve.  There is distention of multiple small bowel loops to 3.4 cm in maximal diameter. Trace associated free fluid is seen. Multiple foci of decompression are seen, with decompression of the distal ileum. This likely reflects small bowel dysmotility, though partial small-bowel obstruction cannot be excluded. A small bowel anastomosis at the left mid abdomen is grossly unremarkable.  There is slight extension of a wall of the transverse colon into a tiny right periumbilical hernia, without evidence for incarceration. A tiny left periumbilical hernia is also seen, containing only fat.  Scattered small hypodensities within the liver are nonspecific but may reflect small cysts. The spleen is unremarkable in appearance. The patient is status post cholecystectomy, with clips noted at the gallbladder fossa. The pancreas and adrenal glands are unremarkable.  Tiny bilateral renal cysts are seen. The kidneys are otherwise unremarkable. There is no  evidence of hydronephrosis. No renal or ureteral stones are seen. No perinephric stranding is appreciated.  The stomach is within normal limits. No acute vascular abnormalities are seen. Diffuse calcification is noted along the abdominal aorta and its branches.  The  appendix is not definitely seen; there is no evidence for appendicitis. Scattered diverticulosis is noted along the entirety of the colon, without evidence of diverticulitis.  The bladder is mildly distended and grossly unremarkable. The uterus is unremarkable in appearance. The right ovary is unremarkable in appearance. No suspicious adnexal masses are seen. No inguinal lymphadenopathy is seen.  No acute osseous abnormalities are identified. There is mild chronic compression deformity of vertebral body T11.  IMPRESSION: 1. Distention of multiple small bowel loops to 3.4 cm in maximal diameter, with trace associated free fluid. Multiple foci of decompression noted along the small bowel, with decompression of the distal ileum. This likely reflects some degree of small bowel dysmotility, though partial small bowel obstruction cannot be excluded. 2. Slight extension of the wall of the transverse colon into a tiny right periumbilical hernia, without evidence for incarceration. Tiny left periumbilical hernia also seen, containing only fat. 3. Diffuse calcification along the abdominal aorta and its branches. 4. Scattered diverticulosis along the entirety of the colon, without evidence of diverticulitis. 5. Calcification at the aortic valve.   Electronically Signed   By: Roanna Raider M.D.   On: 01/10/2015 03:58    EKG: Independently reviewed.  Assessment/Plan Principal Problem:   SBO (small bowel obstruction) (HCC) Active Problems:   AP (abdominal pain)   SIADH (syndrome of inappropriate ADH production) (HCC)   1. SBO - vs ileus 1. NGT ordered 2. Morphine ordered for pain but try to minimize use as this will further reduce motility, hopefully  pain will be relieved with NGT and morphine will not be needed. 3. NPO 1. Pharm consult to convert PO meds to IV as able 4. IVF 5. Appears to have dilated loops of small bowel this time, so does NOT appear to be gastric outlet obstruction this time unlike prior admits last December and February this year. 2. SIADH - 1. Continue salt tabs with the IVF 2. Check BMP tomorrow AM to monitor sodium levels 3. DM2 - not on home meds at baseline, CBG checks AC/HS 4. HTN - continue home meds, pharm consult placed to convert PO BP meds to IV as able 5. Hypothyroidism - change synthroid to IV  Code Status: Full  Family Communication: Family at bedside Disposition Plan: Admit to inpatient   Time spent: 70 min  Paymon Rosensteel M. [facility] Pager [phone]  If 7AM-7PM, please contact the day team taking care of the patient [email or web address] Password [email or web address] 01/10/2015, 5:18 AM

## 2015-01-10 NOTE — Progress Notes (Signed)
2 failed attempts @ NGT placement,wiil report to next shift RN

## 2015-01-10 NOTE — ED Provider Notes (Signed)
CSN: [id number]     Arrival date & time 01/10/15  0036 History  By signing my name below, I, Irene Pap, attest that this documentation has been prepared under the direction and in the presence of Delora Fuel, MD. Electronically Signed: Irene Pap, ED Scribe. 01/10/2015. 4:10 AM.  Chief Complaint  Patient presents with  . Abdominal Pain   The history is provided by the patient. No language interpreter was used.  HPI Comments: Shannon [name] is a 81 y.o. Female with hx of stroke, seizures, HTN, SBO, ventral hernia, and DM who presents to the Emergency Department complaining of gradually worsening, generalized abdominal pain onset one day ago. Pt is brought in from home via [facility] and states that her pain started at 7:00 AM yesterday morning. She currently rates her pain 10/10. She reports associated nausea and vomiting. She states that she has not had a BM since Thursday and has been unable to pass gas. She reports being hospitalized last year for similar symptoms and states that her current pain feels like her past occurrence. She denies diarrhea. Per triage note, pt has a hx of esophageal stretch and recently had oral surgery on Wednesday.    Past Medical History  Diagnosis Date  . Pneumonia   . Thyroid disease   . Seizures ([place])   . Stroke ([place])   . Hyperlipidemia   . Hypertension   . Diabetes mellitus without complication ([place])   . Small bowel obstruction ([place])   . Ventral hernia with bowel obstruction    Past Surgical History  Procedure Laterality Date  . Exploratory laparotomy  09-07-2013  . Cholecystectomy    . Tubal ligation    . Colectomy    . Hernia repair  2015  . Esophagogastroduodenoscopy N/A 01/19/2014    Procedure: ESOPHAGOGASTRODUODENOSCOPY (EGD);  Surgeon: Irene Shipper, MD;  Location: [facility];  Service: Endoscopy;  Laterality: N/A;  . Esophagogastroduodenoscopy N/A 01/20/2014    Procedure: ESOPHAGOGASTRODUODENOSCOPY (EGD);  Surgeon: Irene Shipper, MD;   Location: [facility];  Service: Endoscopy;  Laterality: N/A;  . Esophagogastroduodenoscopy N/A 03/19/2014    Procedure: ESOPHAGOGASTRODUODENOSCOPY (EGD);  Surgeon: Milus Banister, MD;  Location: [facility];  Service: Endoscopy;  Laterality: N/A;   Family History  Problem Relation Age of Onset  . Stroke Mother   . Heart attack Neg Hx   . Hypertension Mother   . Diabetes Brother    Social History  Substance Use Topics  . Smoking status: Never Smoker   . Smokeless tobacco: None  . Alcohol Use: No   OB History    No data available     Review of Systems  Gastrointestinal: Positive for nausea, vomiting, abdominal pain and constipation. Negative for diarrhea.  All other systems reviewed and are negative.  Allergies  Review of patient's allergies indicates no known allergies.  Home Medications   Prior to Admission medications   Medication Sig Start Date End Date Taking? Authorizing Provider  acetaminophen (TYLENOL) 325 MG tablet Take 650 mg by mouth every 6 (six) hours as needed for mild pain or fever.    Historical Provider, MD  albuterol (PROVENTIL HFA;VENTOLIN HFA) 108 (90 BASE) MCG/ACT inhaler Inhale 2 puffs into the lungs every 6 (six) hours as needed for wheezing or shortness of breath. 04/08/14   Dorothy Spark, MD  amLODipine (NORVASC) 10 MG tablet Take 1 tablet (10 mg total) by mouth daily. For HTN 11/04/14   Gildardo Cranker, DO  aspirin EC 81 MG tablet Take 81  mg by mouth every morning.    Historical Provider, MD  atorvastatin (LIPITOR) 10 MG tablet TAKE 1 TABLET BY MOUTH EVERY DAY 09/11/14   Gildardo Cranker, DO  calcium-vitamin D (OSCAL WITH D) 500-200 MG-UNIT per tablet Take 1 tablet by mouth daily with breakfast.     Historical Provider, MD  chlorhexidine (PERIDEX) 0.12 % solution 15 mLs by Mouth Rinse route 2 (two) times daily. 03/26/14   Robbie Lis, MD  cloNIDine (CATAPRES) 0.1 MG tablet Take 1 tablet (0.1 mg total) by mouth 2 (two) times daily. 07/13/14   Dorothy Spark, MD  feeding supplement, GLUCERNA SHAKE, (GLUCERNA SHAKE) LIQD Take 237 mLs by mouth 3 (three) times daily between meals. 05/30/14   Simbiso Ranga, MD  fluticasone (FLONASE) 50 MCG/ACT nasal spray Place 2 sprays into both nostrils daily. 08/07/14   Gildardo Cranker, DO  Fluticasone-Salmeterol (ADVAIR DISKUS IN) Inhale into the lungs as needed.    Historical Provider, MD  gabapentin (NEURONTIN) 300 MG capsule Take 1 capsule (300 mg total) by mouth 3 (three) times daily. 09/23/14   Gildardo Cranker, DO  hydrALAZINE (APRESOLINE) 100 MG tablet TAKE 1 TABLET BY MOUTH 3 TIMES A DAY 06/22/14   Gildardo Cranker, DO  isosorbide mononitrate (IMDUR) 30 MG 24 hr tablet Take one tablet by mouth once daily. If Chest Pains occur increase to two tablets daily. 07/22/14   Gildardo Cranker, DO  L-Methylfolate-Algae-B12-B6 [facility]) 3-90.314-2-35 MG CAPS TAKE 1 CAPSULE BY MOUTH ONCE DAILY 01/01/15   Gildardo Cranker, DO  lactulose (CHRONULAC) 10 GM/15ML solution Take 15 mLs (10 g total) by mouth daily as needed for mild constipation. 06/02/14   Mahima Bubba Camp, MD  latanoprost (XALATAN) 0.005 % ophthalmic solution Place 1 drop into both eyes at bedtime. 06/02/14   Blanchie Serve, MD  levothyroxine (SYNTHROID, LEVOTHROID) 100 MCG tablet Take 1 tablet (100 mcg total) by mouth daily. Take with 19mcg dose (total 270mcg) 11/06/14   Gildardo Cranker, DO  levothyroxine (SYNTHROID, LEVOTHROID) 125 MCG tablet Take 1 tablet (125 mcg total) by mouth daily before breakfast. Take with 191mcg dose (total 237mcg) 11/06/14   Gildardo Cranker, DO  metoprolol succinate (TOPROL-XL) 25 MG 24 hr tablet Take 1 tablet (25 mg total) by mouth daily. 09/23/14   Gildardo Cranker, DO  Multiple Vitamins-Minerals (MULTIVITAMIN WITH MINERALS) tablet Take 1 tablet by mouth every morning.     Historical Provider, MD  pantoprazole (PROTONIX) 40 MG tablet TAKE 1 TABLET BY MOUTH TWICE A DAY 08/07/14   Gildardo Cranker, DO  Polyethyl Glycol-Propyl Glycol 0.4-0.3 % SOLN Apply to eye.    Historical  Provider, MD  polyethylene glycol (MIRALAX / GLYCOLAX) packet Take 17 g by mouth 2 (two) times daily.    Historical Provider, MD  RESTASIS 0.05 % ophthalmic emulsion USE 1 DROP INTO BOTH EYES TWICE DAILY 10/19/14   Gildardo Cranker, DO  sennosides-docusate sodium (SENOKOT-S) 8.6-50 MG tablet Take 1 tablet by mouth 2 (two) times daily.     Historical Provider, MD  sertraline (ZOLOFT) 100 MG tablet Take one tablet by mouth once daily for depression 11/16/14   Gildardo Cranker, DO  sodium chloride 1 G tablet TAKE 1 TABLET BY MOUTH THREE TIMES DAILY 11/04/14   Gildardo Cranker, DO  sodium chloride 1 G tablet Take 1 tablet (1 g total) by mouth 3 (three) times daily with meals. 11/06/14   Gildardo Cranker, DO  traMADol (ULTRAM) 50 MG tablet Take by mouth 2 (two) times daily as needed for moderate pain.  Historical Provider, MD   BP 186/80 mmHg  Pulse 97  Temp(Src) 98.7 F (37.1 C) (Oral)  Resp 18  SpO2 100% Physical Exam  Constitutional: She is oriented to person, place, and time. She appears well-developed and well-nourished.  Appears uncomfortable  HENT:  Head: Normocephalic and atraumatic.  Eyes: EOM are normal. Pupils are equal, round, and reactive to light.  Neck: Normal range of motion. Neck supple. No JVD present.  Cardiovascular: Normal rate, regular rhythm and normal heart sounds.   No murmur heard. Pulmonary/Chest: Effort normal and breath sounds normal. She has no wheezes. She has no rales. She exhibits no tenderness.  Abdominal: Soft. She exhibits no mass. There is no rebound and no guarding.  Mildly distended abdomen; Mild diffuse tenderness; ventral hernia noted is easily reducible. Bowel sounds are decreased.  Musculoskeletal: Normal range of motion. She exhibits no edema.  Lymphadenopathy:    She has no cervical adenopathy.  Neurological: She is alert and oriented to person, place, and time. No cranial nerve deficit. She exhibits normal muscle tone. Coordination normal.  Skin: Skin is warm  and dry. No rash noted.  Psychiatric: She has a normal mood and affect. Her behavior is normal.  Nursing note and vitals reviewed.   ED Course  Procedures (including critical care time) DIAGNOSTIC STUDIES: Oxygen Saturation is 100% on RA, normal by my interpretation.    COORDINATION OF CARE: 12:48 AM-Discussed treatment plan which includes IV fluids, labs, and CT scan with pt at bedside and pt agreed to plan.   Labs Review Results for orders placed or performed during the hospital encounter of 01/10/15  Lipase, blood  Result Value Ref Range   Lipase 21 (L) 22 - 51 U/L  Comprehensive metabolic panel  Result Value Ref Range   Sodium 133 (L) 135 - 145 mmol/L   Potassium 4.0 3.5 - 5.1 mmol/L   Chloride 99 (L) 101 - 111 mmol/L   CO2 21 (L) 22 - 32 mmol/L   Glucose, Bld 136 (H) 65 - 99 mg/dL   BUN 27 (H) 6 - 20 mg/dL   Creatinine, Ser 1.12 (H) 0.44 - 1.00 mg/dL   Calcium 10.3 8.9 - 10.3 mg/dL   Total Protein 7.6 6.5 - 8.1 g/dL   Albumin 4.0 3.5 - 5.0 g/dL   AST 29 15 - 41 U/L   ALT 19 14 - 54 U/L   Alkaline Phosphatase 75 38 - 126 U/L   Total Bilirubin 1.0 0.3 - 1.2 mg/dL   GFR calc non Af Amer 42 (L) >60 mL/min   GFR calc Af Amer 49 (L) >60 mL/min   Anion gap 13 5 - 15  CBC  Result Value Ref Range   WBC 8.3 4.0 - 10.5 K/uL   RBC 3.82 (L) 3.87 - 5.11 MIL/uL   Hemoglobin 12.3 12.0 - 15.0 g/dL   HCT 34.8 (L) 36.0 - 46.0 %   MCV 91.1 78.0 - 100.0 fL   MCH 32.2 26.0 - 34.0 pg   MCHC 35.3 30.0 - 36.0 g/dL   RDW 14.4 11.5 - 15.5 %   Platelets 220 150 - 400 K/uL  Urinalysis, Routine w reflex microscopic (not at [facility])  Result Value Ref Range   Color, Urine YELLOW YELLOW   APPearance CLEAR CLEAR   Specific Gravity, Urine 1.017 1.005 - 1.030   pH 7.5 5.0 - 8.0   Glucose, UA NEGATIVE NEGATIVE mg/dL   Hgb urine dipstick NEGATIVE NEGATIVE   Bilirubin Urine NEGATIVE NEGATIVE  Ketones, ur NEGATIVE NEGATIVE mg/dL   Protein, ur 100 (A) NEGATIVE mg/dL   Urobilinogen, UA 0.2 0.0 -  1.0 mg/dL   Nitrite NEGATIVE NEGATIVE   Leukocytes, UA NEGATIVE NEGATIVE  Differential  Result Value Ref Range   Neutrophils Relative % 88 %   Neutro Abs 7.3 1.7 - 7.7 K/uL   Lymphocytes Relative 6 %   Lymphs Abs 0.5 (L) 0.7 - 4.0 K/uL   Monocytes Relative 6 %   Monocytes Absolute 0.5 0.1 - 1.0 K/uL   Eosinophils Relative 0 %   Eosinophils Absolute 0.0 0.0 - 0.7 K/uL   Basophils Relative 0 %   Basophils Absolute 0.0 0.0 - 0.1 K/uL  Urine microscopic-add on  Result Value Ref Range   Squamous Epithelial / LPF RARE RARE   RBC / HPF 3-6 <3 RBC/hpf  I-Stat CG4 Lactic Acid, ED  Result Value Ref Range   Lactic Acid, Venous 1.37 0.5 - 2.0 mmol/L   Imaging Review Ct Abdomen Pelvis W Contrast  01/10/2015   CLINICAL DATA:  Acute onset of nausea, vomiting and generalized abdominal pain. Initial encounter.  EXAM: CT ABDOMEN AND PELVIS WITH CONTRAST  TECHNIQUE: Multidetector CT imaging of the abdomen and pelvis was performed using the standard protocol following bolus administration of intravenous contrast.  CONTRAST:  15mL OMNIPAQUE IOHEXOL 300 MG/ML  SOLN  COMPARISON:  CT of the abdomen and pelvis performed 05/21/2014  FINDINGS: Minimal right basilar atelectasis is noted. Calcification is noted at the aortic valve.  There is distention of multiple small bowel loops to 3.4 cm in maximal diameter. Trace associated free fluid is seen. Multiple foci of decompression are seen, with decompression of the distal ileum. This likely reflects small bowel dysmotility, though partial small-bowel obstruction cannot be excluded. A small bowel anastomosis at the left mid abdomen is grossly unremarkable.  There is slight extension of a wall of the transverse colon into a tiny right periumbilical hernia, without evidence for incarceration. A tiny left periumbilical hernia is also seen, containing only fat.  Scattered small hypodensities within the liver are nonspecific but may reflect small cysts. The spleen is  unremarkable in appearance. The patient is status post cholecystectomy, with clips noted at the gallbladder fossa. The pancreas and adrenal glands are unremarkable.  Tiny bilateral renal cysts are seen. The kidneys are otherwise unremarkable. There is no evidence of hydronephrosis. No renal or ureteral stones are seen. No perinephric stranding is appreciated.  The stomach is within normal limits. No acute vascular abnormalities are seen. Diffuse calcification is noted along the abdominal aorta and its branches.  The appendix is not definitely seen; there is no evidence for appendicitis. Scattered diverticulosis is noted along the entirety of the colon, without evidence of diverticulitis.  The bladder is mildly distended and grossly unremarkable. The uterus is unremarkable in appearance. The right ovary is unremarkable in appearance. No suspicious adnexal masses are seen. No inguinal lymphadenopathy is seen.  No acute osseous abnormalities are identified. There is mild chronic compression deformity of vertebral body T11.  IMPRESSION: 1. Distention of multiple small bowel loops to 3.4 cm in maximal diameter, with trace associated free fluid. Multiple foci of decompression noted along the small bowel, with decompression of the distal ileum. This likely reflects some degree of small bowel dysmotility, though partial small bowel obstruction cannot be excluded. 2. Slight extension of the wall of the transverse colon into a tiny right periumbilical hernia, without evidence for incarceration. Tiny left periumbilical hernia also seen, containing only  fat. 3. Diffuse calcification along the abdominal aorta and its branches. 4. Scattered diverticulosis along the entirety of the colon, without evidence of diverticulitis. 5. Calcification at the aortic valve.   Electronically Signed   By: Garald Balding M.D.   On: 01/10/2015 03:58   I have personally reviewed and evaluated these images and lab results as part of my medical  decision-making.  MDM   Final diagnoses:  Abdominal pain, unspecified abdominal location  Renal insufficiency    Abdominal pain with vomiting. Old records are reviewed and she was hospitalized with similar symptoms in February. Review of gastroenterology consult shows that she has had problems with gastric outlet obstruction as well as problems with bezoar. She'll be on IV fluids and morphine for pain and metoclopramide for nausea. She'll be sent for CT scan of abdomen and pelvis.  CT is read by radiologist as showing dysmotility of bowel with some dilated small bowel loops. I reviewed the images and then concerned about early small bowel obstruction. WBC is normal as is lactate. Renal insufficiency is unchanged from baseline. Case is discussed with Dr. Alcario Drought of [facility] who agrees to admit the patient.  I, Dashiel Bergquist, personally performed the services described in this documentation. All medical record entries made by the scribe were at my direction and in my presence.  I have reviewed the chart and discharge instructions and agree that the record reflects my personal performance and is accurate and complete. Manasseh Pittsley.  01/10/2015. 4:57 AM.       Delora Fuel, MD [id number]

## 2015-01-10 NOTE — ED Notes (Signed)
Dr Gardner in room 

## 2015-01-10 NOTE — Progress Notes (Signed)
[place] for PO to IV conversion Indication: SBO   Medications:  Prescriptions prior to admission  Medication Sig Dispense Refill Last Dose  . acetaminophen (TYLENOL) 325 MG tablet Take 650 mg by mouth every 6 (six) hours as needed for mild pain or fever.   01/09/2015 at Unknown time  . ADVAIR DISKUS 250-50 MCG/DOSE AEPB INHALE 1 PUFF INTO THE LUNGS 2 (TWO) TIMES DAILY AS NEEDED FOR SHORTNESS OF BREATH  3 Past Month at Unknown time  . albuterol (PROVENTIL HFA;VENTOLIN HFA) 108 (90 BASE) MCG/ACT inhaler Inhale 2 puffs into the lungs every 6 (six) hours as needed for wheezing or shortness of breath. 1 Inhaler 2 Past Month at Unknown time  . amLODipine (NORVASC) 10 MG tablet Take 1 tablet (10 mg total) by mouth daily. For HTN 30 tablet 3 01/09/2015 at Unknown time  . aspirin EC 81 MG tablet Take 81 mg by mouth every morning.   01/10/2015 at Unknown time  . atorvastatin (LIPITOR) 10 MG tablet TAKE 1 TABLET BY MOUTH EVERY DAY 30 tablet 5 01/09/2015 at Unknown time  . b complex vitamins tablet Take 1 tablet by mouth 2 (two) times daily.   01/09/2015 at Unknown time  . calcium-vitamin D (OSCAL WITH D) 500-200 MG-UNIT per tablet Take 1 tablet by mouth daily with breakfast.    01/09/2015 at Unknown time  . chlorhexidine (PERIDEX) 0.12 % solution 15 mLs by Mouth Rinse route 2 (two) times daily. 120 mL 0 01/09/2015 at Unknown time  . cloNIDine (CATAPRES) 0.1 MG tablet Take 1 tablet (0.1 mg total) by mouth 2 (two) times daily. 60 tablet 11 01/09/2015 at Unknown time  . feeding supplement, GLUCERNA SHAKE, (GLUCERNA SHAKE) LIQD Take 237 mLs by mouth 3 (three) times daily between meals. 90 Can 1 01/09/2015 at Unknown time  . fluticasone (FLONASE) 50 MCG/ACT nasal spray Place 2 sprays into both nostrils daily. 16 g 3 01/09/2015 at Unknown time  . gabapentin (NEURONTIN) 300 MG capsule Take 1 capsule (300 mg total) by mouth 3 (three) times daily. 90 capsule 3 01/09/2015 at Unknown time   . hydrALAZINE (APRESOLINE) 100 MG tablet TAKE 1 TABLET BY MOUTH 3 TIMES A DAY 90 tablet 5 01/09/2015 at Unknown time  . isosorbide mononitrate (IMDUR) 30 MG 24 hr tablet Take one tablet by mouth once daily. If Chest Pains occur increase to two tablets daily. 90 tablet 3 01/09/2015 at Unknown time  . L-Methylfolate-Algae-B12-B6 (METANX) 3-90.314-2-35 MG CAPS TAKE 1 CAPSULE BY MOUTH ONCE DAILY 30 capsule 3 01/09/2015 at Unknown time  . lactulose (CHRONULAC) 10 GM/15ML solution Take 15 mLs (10 g total) by mouth daily as needed for mild constipation. 240 mL 0 Past Month at Unknown time  . latanoprost (XALATAN) 0.005 % ophthalmic solution Place 1 drop into both eyes at bedtime. 2.5 mL 12 01/09/2015 at Unknown time  . levothyroxine (SYNTHROID, LEVOTHROID) 100 MCG tablet Take 1 tablet (100 mcg total) by mouth daily. Take with 176mcg dose (total 213mcg) 30 tablet 3 01/09/2015 at Unknown time  . levothyroxine (SYNTHROID, LEVOTHROID) 125 MCG tablet Take 1 tablet (125 mcg total) by mouth daily before breakfast. Take with 139mcg dose (total 227mcg) 30 tablet 3 01/09/2015 at Unknown time  . metoprolol succinate (TOPROL-XL) 25 MG 24 hr tablet Take 1 tablet (25 mg total) by mouth daily. 30 tablet 3 01/09/2015 at 0700  . Multiple Vitamins-Minerals (MULTIVITAMIN WITH MINERALS) tablet Take 1 tablet by mouth every morning.    01/09/2015 at  Unknown time  . pantoprazole (PROTONIX) 40 MG tablet TAKE 1 TABLET BY MOUTH TWICE A DAY 60 tablet 11 01/09/2015 at Unknown time  . Polyethyl Glycol-Propyl Glycol 0.4-0.3 % SOLN Place 1 drop into both eyes 4 (four) times daily.    01/09/2015 at Unknown time  . polyethylene glycol (MIRALAX / GLYCOLAX) packet Take 17 g by mouth 2 (two) times daily.   01/09/2015 at Unknown time  . RESTASIS 0.05 % ophthalmic emulsion USE 1 DROP INTO BOTH EYES TWICE DAILY 60 mL 1 01/09/2015 at Unknown time  . sennosides-docusate sodium (SENOKOT-S) 8.6-50 MG tablet Take 1 tablet by mouth 2 (two) times daily.     01/09/2015 at Unknown time  . sertraline (ZOLOFT) 100 MG tablet Take one tablet by mouth once daily for depression 30 tablet 3 01/09/2015 at Unknown time  . sodium chloride 1 G tablet TAKE 1 TABLET BY MOUTH THREE TIMES DAILY 90 tablet 2 01/09/2015 at Unknown time  . traMADol (ULTRAM) 50 MG tablet Take by mouth 2 (two) times daily as needed for moderate pain.   Past Week at Unknown time     Pharmacy asked to convert as many PO meds as possible to IV 2/2 SBO vs ileus --> pt NPO.    We can convert: - hydralazine 100mg  PO TID to hydralazine 10mg  IV Q8H (of note, pt may require more frequent dosing up to Q4H but initially dosing cautiously to prevent hypotension) - Synthroid 253mcg PO daily to 112.70mcg IV daily - Toprol 25mg  daily to Lopressor 2.5mg  IV Q6H - Protonix 40mg  PO BID to Protonix 40mg  IV daily  There is no IV equivalent for: - Norvasc 10mg  daily - Lipitor 10mg  daily - clonidine 0.1mg  BID - Neurontin 300mg  TID - Imdur 30-60mg  daily - ASAEC 81mg  daily - Senna-S BID - multivitamin and minerals (multiple varieties daily)  Considerations/recommendations: - If further BP control needed consider adding enalapril IV. - If NPO will continue for several days consider clonidine patch (though this takes a few days for full effect). - If CP occurs, consider NTG. - If ASA coverage is imperative consider rectal administration. - If NPO will continue for several days consider TPN for nutrition/vitamins.  Thank you for involving pharmacy in this pt's care.   Wynona Neat, PharmD, BCPS  01/10/2015,6:00 AM

## 2015-01-10 NOTE — ED Notes (Signed)
Pt in from home via [facility] reports abd pain that started yesterday approx 0700 also with nausea. Hx esophageal stretch. Recently had oral surgery on Wednesday.

## 2015-01-10 NOTE — Consult Note (Signed)
Reason for Consult:  Acute abdominal pain with SBO on CT Referring Physician:  Dr. Tera Partridge  Shannon [name] is an 81 y.o. female.  HPI: Pt first seen by our service on 01/18/14, she was in with gastric outlet obstruction and prior gastrojejunostomy and now jejunojejunostomy due ot perforated ulcer, also had repair of incarcerated ventral hernia [facility] 09/2013).  EGD at this time revealed erosive esophagitis with stricture.  A large gastric bezoar replacing the proximal third of the stomach but no endoscopic evidence for surgical altered anatomy, stomach ulcer, or high-grade stenosis. she went home 01/22/14.  Readmitted on 03/28/15, with gastric outlet obstruction and possible perforation at the GE junction.  UGI did not show a perforation.  EGD on 03/19/14 showed: The distal gastric anatomy (prepylorus, pylorus) was very tortuous, narrowed without clear strictures or ulcers. He was able to advance the adult gastroscope through this region with only minimal pressure. The mucosa is normal through the tortuous region. The duodenal bulb and more distal duodenum appeared normal. It was his opinion the distorted distal gastric anatomy was causing this partial obstruction.Family not interested in any further surgery, her multiple medical issues improved and she was sent home on 03/27/15. Readmitted 05/2014 again with GOO and GI again recommended going no further than full liquids, but sent home on Pureed diet.  Ok on follow up visit 06/10/14. Now readmitted last PM with abdominal pain, and no BM since 01/07/15..  She did have some teeth pulled 01/06/15, the day before symptoms started.  Work up in the ED shows she is afebrile, VSS.  Creatinine is 1.12.  CT scan shows: Distention of multiple small bowel loops to 3.4 cm in maximal diameter, with trace associated free fluid. Multiple foci of decompression noted along the small bowel, with decompression of the distal ileum. This likely reflects some degree of small bowel  dysmotility, though partial small bowel obstruction cannot be Excluded.  Slight extension of the wall of the transverse colon into a tiny right periumbilical hernia, without evidence for incarceration. Tiny left periumbilical hernia also seen, containing only fat.   Attempts to pass an NG have failed, awaiting IR.    Past Medical History  Diagnosis Date  . Pneumonia   . Thyroid disease   . Seizures ([place])   . Stroke ([address])   . Hyperlipidemia   . Hypertension   . Diabetes mellitus without complication ([place])   . Small bowel obstruction ([place])   . Ventral hernia with bowel obstruction   . Asthma   . Renal insufficiency     Past Surgical History  Procedure Laterality Date  . Exploratory laparotomy  09-07-2013  . Cholecystectomy    . Tubal ligation    . Colectomy    . Hernia repair  2015  . Esophagogastroduodenoscopy N/A 01/19/2014    Procedure: ESOPHAGOGASTRODUODENOSCOPY (EGD);  Surgeon: Irene Shipper, MD;  Location: [facility];  Service: Endoscopy;  Laterality: N/A;  . Esophagogastroduodenoscopy N/A 01/20/2014    Procedure: ESOPHAGOGASTRODUODENOSCOPY (EGD);  Surgeon: Irene Shipper, MD;  Location: [facility];  Service: Endoscopy;  Laterality: N/A;  . Esophagogastroduodenoscopy N/A 03/19/2014    Procedure: ESOPHAGOGASTRODUODENOSCOPY (EGD);  Surgeon: Milus Banister, MD;  Location: [facility];  Service: Endoscopy;  Laterality: N/A;    Family History  Problem Relation Age of Onset  . Stroke Mother   . Heart attack Neg Hx   . Hypertension Mother   . Diabetes Brother     Social History:  reports that she has never smoked.  She does not have any smokeless tobacco history on file. She reports that she does not drink alcohol or use illicit drugs.  Allergies: No Known Allergies  Medications:  Prior to Admission:  Prescriptions prior to admission  Medication Sig Dispense Refill Last Dose  . acetaminophen (TYLENOL) 325 MG tablet Take 650 mg by mouth every 6 (six) hours as needed for  mild pain or fever.   01/09/2015 at Unknown time  . ADVAIR DISKUS 250-50 MCG/DOSE AEPB INHALE 1 PUFF INTO THE LUNGS 2 (TWO) TIMES DAILY AS NEEDED FOR SHORTNESS OF BREATH  3 Past Month at Unknown time  . albuterol (PROVENTIL HFA;VENTOLIN HFA) 108 (90 BASE) MCG/ACT inhaler Inhale 2 puffs into the lungs every 6 (six) hours as needed for wheezing or shortness of breath. 1 Inhaler 2 Past Month at Unknown time  . amLODipine (NORVASC) 10 MG tablet Take 1 tablet (10 mg total) by mouth daily. For HTN 30 tablet 3 01/09/2015 at Unknown time  . aspirin EC 81 MG tablet Take 81 mg by mouth every morning.   01/10/2015 at Unknown time  . atorvastatin (LIPITOR) 10 MG tablet TAKE 1 TABLET BY MOUTH EVERY DAY 30 tablet 5 01/09/2015 at Unknown time  . b complex vitamins tablet Take 1 tablet by mouth 2 (two) times daily.   01/09/2015 at Unknown time  . calcium-vitamin D (OSCAL WITH D) 500-200 MG-UNIT per tablet Take 1 tablet by mouth daily with breakfast.    01/09/2015 at Unknown time  . chlorhexidine (PERIDEX) 0.12 % solution 15 mLs by Mouth Rinse route 2 (two) times daily. 120 mL 0 01/09/2015 at Unknown time  . cloNIDine (CATAPRES) 0.1 MG tablet Take 1 tablet (0.1 mg total) by mouth 2 (two) times daily. 60 tablet 11 01/09/2015 at Unknown time  . feeding supplement, GLUCERNA SHAKE, (GLUCERNA SHAKE) LIQD Take 237 mLs by mouth 3 (three) times daily between meals. 90 Can 1 01/09/2015 at Unknown time  . fluticasone (FLONASE) 50 MCG/ACT nasal spray Place 2 sprays into both nostrils daily. 16 g 3 01/09/2015 at Unknown time  . gabapentin (NEURONTIN) 300 MG capsule Take 1 capsule (300 mg total) by mouth 3 (three) times daily. 90 capsule 3 01/09/2015 at Unknown time  . hydrALAZINE (APRESOLINE) 100 MG tablet TAKE 1 TABLET BY MOUTH 3 TIMES A DAY 90 tablet 5 01/09/2015 at Unknown time  . isosorbide mononitrate (IMDUR) 30 MG 24 hr tablet Take one tablet by mouth once daily. If Chest Pains occur increase to two tablets daily. 90 tablet 3 01/09/2015  at Unknown time  . L-Methylfolate-Algae-B12-B6 (METANX) 3-90.314-2-35 MG CAPS TAKE 1 CAPSULE BY MOUTH ONCE DAILY 30 capsule 3 01/09/2015 at Unknown time  . lactulose (CHRONULAC) 10 GM/15ML solution Take 15 mLs (10 g total) by mouth daily as needed for mild constipation. 240 mL 0 Past Month at Unknown time  . latanoprost (XALATAN) 0.005 % ophthalmic solution Place 1 drop into both eyes at bedtime. 2.5 mL 12 01/09/2015 at Unknown time  . levothyroxine (SYNTHROID, LEVOTHROID) 100 MCG tablet Take 1 tablet (100 mcg total) by mouth daily. Take with 136mg dose (total 224m) 30 tablet 3 01/09/2015 at Unknown time  . levothyroxine (SYNTHROID, LEVOTHROID) 125 MCG tablet Take 1 tablet (125 mcg total) by mouth daily before breakfast. Take with 10069mdose (total 225m13m30 tablet 3 01/09/2015 at Unknown time  . metoprolol succinate (TOPROL-XL) 25 MG 24 hr tablet Take 1 tablet (25 mg total) by mouth daily. 30 tablet 3 01/09/2015 at 0700  . Multiple  Vitamins-Minerals (MULTIVITAMIN WITH MINERALS) tablet Take 1 tablet by mouth every morning.    01/09/2015 at Unknown time  . pantoprazole (PROTONIX) 40 MG tablet TAKE 1 TABLET BY MOUTH TWICE A DAY 60 tablet 11 01/09/2015 at Unknown time  . Polyethyl Glycol-Propyl Glycol 0.4-0.3 % SOLN Place 1 drop into both eyes 4 (four) times daily.    01/09/2015 at Unknown time  . polyethylene glycol (MIRALAX / GLYCOLAX) packet Take 17 g by mouth 2 (two) times daily.   01/09/2015 at Unknown time  . RESTASIS 0.05 % ophthalmic emulsion USE 1 DROP INTO BOTH EYES TWICE DAILY 60 mL 1 01/09/2015 at Unknown time  . sennosides-docusate sodium (SENOKOT-S) 8.6-50 MG tablet Take 1 tablet by mouth 2 (two) times daily.    01/09/2015 at Unknown time  . sertraline (ZOLOFT) 100 MG tablet Take one tablet by mouth once daily for depression 30 tablet 3 01/09/2015 at Unknown time  . sodium chloride 1 G tablet TAKE 1 TABLET BY MOUTH THREE TIMES DAILY 90 tablet 2 01/09/2015 at Unknown time  . traMADol (ULTRAM) 50 MG  tablet Take by mouth 2 (two) times daily as needed for moderate pain.   Past Week at Unknown time   Scheduled: . amLODipine  10 mg Oral Daily  . aspirin EC  81 mg Oral q morning - 10a  . cloNIDine  0.1 mg Oral BID  . cycloSPORINE  1 drop Both Eyes BID  . gabapentin  300 mg Oral TID  . heparin  5,000 Units Subcutaneous 3 times per day  . hydrALAZINE  10 mg Intravenous 3 times per day  . isosorbide mononitrate  30 mg Oral Daily  . latanoprost  1 drop Both Eyes QHS  . levothyroxine  112.5 mcg Intravenous QAC breakfast  . metoprolol  2.5 mg Intravenous 4 times per day  . mometasone-formoterol  2 puff Inhalation BID  . pantoprazole (PROTONIX) IV  40 mg Intravenous QHS  . polyvinyl alcohol  1 drop Both Eyes QID  . sertraline  100 mg Oral Daily  . sodium chloride  1 g Oral TID   Continuous: . sodium chloride 100 mL/hr at 01/10/15 0932   NOI:[id number], albuterol, iohexol, morphine injection, ondansetron (ZOFRAN) IV Anti-infectives    None      Results for orders placed or performed during the hospital encounter of 01/10/15 (from the past 48 hour(s))  Lipase, blood     Status: Abnormal   Collection Time: 01/10/15  1:31 AM  Result Value Ref Range   Lipase 21 (L) 22 - 51 U/L  Comprehensive metabolic panel     Status: Abnormal   Collection Time: 01/10/15  1:31 AM  Result Value Ref Range   Sodium 133 (L) 135 - 145 mmol/L   Potassium 4.0 3.5 - 5.1 mmol/L   Chloride 99 (L) 101 - 111 mmol/L   CO2 21 (L) 22 - 32 mmol/L   Glucose, Bld 136 (H) 65 - 99 mg/dL   BUN 27 (H) 6 - 20 mg/dL   Creatinine, Ser 1.12 (H) 0.44 - 1.00 mg/dL   Calcium 10.3 8.9 - 10.3 mg/dL   Total Protein 7.6 6.5 - 8.1 g/dL   Albumin 4.0 3.5 - 5.0 g/dL   AST 29 15 - 41 U/L   ALT 19 14 - 54 U/L   Alkaline Phosphatase 75 38 - 126 U/L   Total Bilirubin 1.0 0.3 - 1.2 mg/dL   GFR calc non Af Amer 42 (L) >60 mL/min   GFR calc Af  Amer 49 (L) >60 mL/min    Comment: (NOTE) The eGFR has been calculated using the CKD  EPI equation. This calculation has not been validated in all clinical situations. eGFR's persistently <60 mL/min signify possible Chronic Kidney Disease.    Anion gap 13 5 - 15  CBC     Status: Abnormal   Collection Time: 01/10/15  1:31 AM  Result Value Ref Range   WBC 8.3 4.0 - 10.5 K/uL   RBC 3.82 (L) 3.87 - 5.11 MIL/uL   Hemoglobin 12.3 12.0 - 15.0 g/dL   HCT 34.8 (L) 36.0 - 46.0 %   MCV 91.1 78.0 - 100.0 fL   MCH 32.2 26.0 - 34.0 pg   MCHC 35.3 30.0 - 36.0 g/dL   RDW 14.4 11.5 - 15.5 %   Platelets 220 150 - 400 K/uL  Differential     Status: Abnormal   Collection Time: 01/10/15  1:31 AM  Result Value Ref Range   Neutrophils Relative % 88 %   Neutro Abs 7.3 1.7 - 7.7 K/uL   Lymphocytes Relative 6 %   Lymphs Abs 0.5 (L) 0.7 - 4.0 K/uL   Monocytes Relative 6 %   Monocytes Absolute 0.5 0.1 - 1.0 K/uL   Eosinophils Relative 0 %   Eosinophils Absolute 0.0 0.0 - 0.7 K/uL   Basophils Relative 0 %   Basophils Absolute 0.0 0.0 - 0.1 K/uL  I-Stat CG4 Lactic Acid, ED     Status: None   Collection Time: 01/10/15  1:37 AM  Result Value Ref Range   Lactic Acid, Venous 1.37 0.5 - 2.0 mmol/L  Urinalysis, Routine w reflex microscopic (not at [facility])     Status: Abnormal   Collection Time: 01/10/15  3:10 AM  Result Value Ref Range   Color, Urine YELLOW YELLOW   APPearance CLEAR CLEAR   Specific Gravity, Urine 1.017 1.005 - 1.030   pH 7.5 5.0 - 8.0   Glucose, UA NEGATIVE NEGATIVE mg/dL   Hgb urine dipstick NEGATIVE NEGATIVE   Bilirubin Urine NEGATIVE NEGATIVE   Ketones, ur NEGATIVE NEGATIVE mg/dL   Protein, ur 100 (A) NEGATIVE mg/dL   Urobilinogen, UA 0.2 0.0 - 1.0 mg/dL   Nitrite NEGATIVE NEGATIVE   Leukocytes, UA NEGATIVE NEGATIVE  Urine microscopic-add on     Status: None   Collection Time: 01/10/15  3:10 AM  Result Value Ref Range   Squamous Epithelial / LPF RARE RARE   RBC / HPF 3-6 <3 RBC/hpf  Glucose, capillary     Status: Abnormal   Collection Time: 01/10/15  7:54 AM   Result Value Ref Range   Glucose-Capillary 141 (H) 65 - 99 mg/dL   Comment 1 Notify RN     Ct Abdomen Pelvis W Contrast  01/10/2015   CLINICAL DATA:  Acute onset of nausea, vomiting and generalized abdominal pain. Initial encounter.  EXAM: CT ABDOMEN AND PELVIS WITH CONTRAST  TECHNIQUE: Multidetector CT imaging of the abdomen and pelvis was performed using the standard protocol following bolus administration of intravenous contrast.  CONTRAST:  71m OMNIPAQUE IOHEXOL 300 MG/ML  SOLN  COMPARISON:  CT of the abdomen and pelvis performed 05/21/2014  FINDINGS: Minimal right basilar atelectasis is noted. Calcification is noted at the aortic valve.  There is distention of multiple small bowel loops to 3.4 cm in maximal diameter. Trace associated free fluid is seen. Multiple foci of decompression are seen, with decompression of the distal ileum. This likely reflects small bowel dysmotility, though partial small-bowel obstruction  cannot be excluded. A small bowel anastomosis at the left mid abdomen is grossly unremarkable.  There is slight extension of a wall of the transverse colon into a tiny right periumbilical hernia, without evidence for incarceration. A tiny left periumbilical hernia is also seen, containing only fat.  Scattered small hypodensities within the liver are nonspecific but may reflect small cysts. The spleen is unremarkable in appearance. The patient is status post cholecystectomy, with clips noted at the gallbladder fossa. The pancreas and adrenal glands are unremarkable.  Tiny bilateral renal cysts are seen. The kidneys are otherwise unremarkable. There is no evidence of hydronephrosis. No renal or ureteral stones are seen. No perinephric stranding is appreciated.  The stomach is within normal limits. No acute vascular abnormalities are seen. Diffuse calcification is noted along the abdominal aorta and its branches.  The appendix is not definitely seen; there is no evidence for appendicitis.  Scattered diverticulosis is noted along the entirety of the colon, without evidence of diverticulitis.  The bladder is mildly distended and grossly unremarkable. The uterus is unremarkable in appearance. The right ovary is unremarkable in appearance. No suspicious adnexal masses are seen. No inguinal lymphadenopathy is seen.  No acute osseous abnormalities are identified. There is mild chronic compression deformity of vertebral body T11.  IMPRESSION: 1. Distention of multiple small bowel loops to 3.4 cm in maximal diameter, with trace associated free fluid. Multiple foci of decompression noted along the small bowel, with decompression of the distal ileum. This likely reflects some degree of small bowel dysmotility, though partial small bowel obstruction cannot be excluded. 2. Slight extension of the wall of the transverse colon into a tiny right periumbilical hernia, without evidence for incarceration. Tiny left periumbilical hernia also seen, containing only fat. 3. Diffuse calcification along the abdominal aorta and its branches. 4. Scattered diverticulosis along the entirety of the colon, without evidence of diverticulitis. 5. Calcification at the aortic valve.   Electronically Signed   By: Garald Balding M.D.   On: 01/10/2015 03:58    Review of Systems  Constitutional: Negative for fever, chills, weight loss, malaise/fatigue and diaphoresis.  HENT: Negative.   Eyes: Negative.   Respiratory: Negative.   Cardiovascular: Negative.   Gastrointestinal: Positive for nausea and abdominal pain. Vomiting: with attempts to place NG.  Genitourinary:       She is incontinent currently in bed.  Musculoskeletal: Negative.   Skin: Negative.   Neurological: Negative.   Psychiatric/Behavioral: Negative.    Blood pressure 176/78, pulse 80, temperature 98.7 F (37.1 C), temperature source Oral, resp. rate 16, SpO2 98 %. Physical Exam  Constitutional: She is oriented to person, place, and time. She appears  distressed.  Elderly female, having allot of abdominal pain and incontinent.  HENT:  Head: Normocephalic and atraumatic.  Eyes: Conjunctivae and EOM are normal. Right eye exhibits no discharge. Left eye exhibits no discharge. No scleral icterus.  Neck: Normal range of motion. Neck supple. No JVD present. No tracheal deviation present. No thyromegaly present.  Cardiovascular: Normal heart sounds.   No murmur heard. Tachycardic and irregular  Respiratory: Effort normal and breath sounds normal. No respiratory distress. She has no wheezes. She has no rales. She exhibits no tenderness.  GI: She exhibits distension. There is tenderness (tender all over.). There is no rebound and no guarding.  Large midline incision, with small reducible ventral hernia.  Musculoskeletal: She exhibits no edema or tenderness.  Lymphadenopathy:    She has no cervical adenopathy.  Neurological: She is  alert and oriented to person, place, and time. No cranial nerve deficit.  Skin: Skin is warm and dry. No rash noted. She is not diaphoretic. No erythema. No pallor.  Psychiatric: She has a normal mood and affect. Her behavior is normal. Judgment and thought content normal.    Assessment/Plan: SBO Hx of gastric outlet obstruction/chronic Reglan use Hx of prior gastrojejunostomy and now jejunojejunostomy due ot perforated ulcer, also had repair of incarcerated ventral hernia [facility] 09/2013 Hx of large gastric Bezoar,  On Pureed diet  [facility] follows her Hx of hypertension Hx of Hyponatremia AODM Hx of CVA Hx of seizures  Plan:  NG decompression, bowel rest and IV hydration.  Will recheck film in AM.  Family is not in the room but her daughter has not wanted surgery in the past to address GOO.  We will follow with you and recommend conservative medical management at this time.  Neill Jurewicz 01/10/2015, 10:56 AM

## 2015-01-10 NOTE — Progress Notes (Signed)
Utilization Review Completed.Shannon [name] T10/12/2014  

## 2015-01-11 ENCOUNTER — Inpatient Hospital Stay (HOSPITAL_COMMUNITY): Payer: Medicare Other

## 2015-01-11 ENCOUNTER — Encounter (HOSPITAL_COMMUNITY): Payer: Self-pay | Admitting: General Practice

## 2015-01-11 DIAGNOSIS — K560 Paralytic ileus: Secondary | ICD-10-CM

## 2015-01-11 DIAGNOSIS — E876 Hypokalemia: Secondary | ICD-10-CM

## 2015-01-11 LAB — CBC
HCT: 33.1 % — ABNORMAL LOW (ref 36.0–46.0)
Hemoglobin: 11.5 g/dL — ABNORMAL LOW (ref 12.0–15.0)
MCH: 32.1 pg (ref 26.0–34.0)
MCHC: 34.7 g/dL (ref 30.0–36.0)
MCV: 92.5 fL (ref 78.0–100.0)
Platelets: 212 10*3/uL (ref 150–400)
RBC: 3.58 MIL/uL — ABNORMAL LOW (ref 3.87–5.11)
RDW: 14.6 % (ref 11.5–15.5)
WBC: 5.9 10*3/uL (ref 4.0–10.5)

## 2015-01-11 LAB — GLUCOSE, CAPILLARY
Glucose-Capillary: 101 mg/dL — ABNORMAL HIGH (ref 65–99)
Glucose-Capillary: 111 mg/dL — ABNORMAL HIGH (ref 65–99)
Glucose-Capillary: 81 mg/dL (ref 65–99)
Glucose-Capillary: 89 mg/dL (ref 65–99)
Glucose-Capillary: 96 mg/dL (ref 65–99)

## 2015-01-11 LAB — BASIC METABOLIC PANEL
Anion gap: 12 (ref 5–15)
BUN: 13 mg/dL (ref 6–20)
CO2: 22 mmol/L (ref 22–32)
Calcium: 9.5 mg/dL (ref 8.9–10.3)
Chloride: 102 mmol/L (ref 101–111)
Creatinine, Ser: 0.96 mg/dL (ref 0.44–1.00)
GFR calc Af Amer: 59 mL/min — ABNORMAL LOW (ref >60)
GFR calc non Af Amer: 51 mL/min — ABNORMAL LOW (ref >60)
Glucose, Bld: 113 mg/dL — ABNORMAL HIGH (ref 65–99)
Potassium: 3.3 mmol/L — ABNORMAL LOW (ref 3.5–5.1)
Sodium: 136 mmol/L (ref 135–145)

## 2015-01-11 MED ORDER — POTASSIUM CHLORIDE IN NACL 20-0.9 MEQ/L-% IV SOLN
INTRAVENOUS | Status: AC
Start: 2015-01-11 — End: 2015-01-12
  Administered 2015-01-11 – 2015-01-12 (×3): via INTRAVENOUS
  Filled 2015-01-11 (×3): qty 1000

## 2015-01-11 MED ORDER — DEXTROSE 50 % IV SOLN
1.0000 | INTRAVENOUS | Status: DC | PRN
  Administered 2015-01-12: 50 mL via INTRAVENOUS
  Filled 2015-01-11: qty 50

## 2015-01-11 MED ORDER — INFLUENZA VAC SPLIT QUAD 0.5 ML IM SUSY
0.5000 mL | PREFILLED_SYRINGE | INTRAMUSCULAR | Status: AC
  Administered 2015-01-27: 0.5 mL via INTRAMUSCULAR
  Filled 2015-01-11 (×2): qty 0.5

## 2015-01-11 MED ORDER — DIATRIZOATE MEGLUMINE & SODIUM 66-10 % PO SOLN
90.0000 mL | Freq: Once | ORAL | Status: AC
  Administered 2015-01-11: 90 mL via NASOGASTRIC

## 2015-01-11 MED ORDER — POTASSIUM CHLORIDE 10 MEQ/100ML IV SOLN
10.0000 meq | INTRAVENOUS | Status: AC
  Administered 2015-01-11 (×4): 10 meq via INTRAVENOUS
  Filled 2015-01-11 (×3): qty 100

## 2015-01-11 MED ORDER — DIATRIZOATE MEGLUMINE & SODIUM 66-10 % PO SOLN
ORAL | Status: AC
Start: 2015-01-11 — End: 2015-01-11
  Filled 2015-01-11: qty 90

## 2015-01-11 NOTE — Progress Notes (Signed)
Patient ID: Shannon [name], female   DOB: 1923/11/19, 81 y.o.   MRN: [id number]     [facility]      [address] [place]   [place], [place] [address]    Phone: [phone] FAX: [phone]     Subjective: Felt better after NGT placed.  Had several episodes of emesis, >3L. out since NGT placed this AM.   Objective:  Vital signs:  Filed Vitals:   01/10/15 1600 01/10/15 2100 01/11/15 0500 01/11/15 0759  BP: 186/76 175/78 188/95   Pulse: 98 101 100   Temp:  99.1 F (37.3 C) 99.1 F (37.3 C)   TempSrc:  Oral Oral   Resp:  20 20   SpO2: 98% 98% 100% 97%    Last BM Date: 01/07/15  Intake/Output   Yesterday:  10/09 0701 - 10/10 0700 In: 0  Out: 900 [Urine:900] This shift:  Total I/O In: 0  Out: 1000 [Urine:350; Emesis/NG output:650]   Physical Exam: General: Pt awake/alert/oriented x4 in mild acute distress  Abdomen: Soft.  Nondistended. General tenderness.  No evidence of peritonitis.  No incarcerated hernias.    Problem List:   Principal Problem:   SBO (small bowel obstruction) (HCC) Active Problems:   AP (abdominal pain)   SIADH (syndrome of inappropriate ADH production) (HCC)    Results:   Labs: Results for orders placed or performed during the hospital encounter of 01/10/15 (from the past 48 hour(s))  Lipase, blood     Status: Abnormal   Collection Time: 01/10/15  1:31 AM  Result Value Ref Range   Lipase 21 (L) 22 - 51 U/L  Comprehensive metabolic panel     Status: Abnormal   Collection Time: 01/10/15  1:31 AM  Result Value Ref Range   Sodium 133 (L) 135 - 145 mmol/L   Potassium 4.0 3.5 - 5.1 mmol/L   Chloride 99 (L) 101 - 111 mmol/L   CO2 21 (L) 22 - 32 mmol/L   Glucose, Bld 136 (H) 65 - 99 mg/dL   BUN 27 (H) 6 - 20 mg/dL   Creatinine, Ser 7.56 (H) 0.44 - 1.00 mg/dL   Calcium 43.3 8.9 - 29.5 mg/dL   Total Protein 7.6 6.5 - 8.1 g/dL   Albumin 4.0 3.5 - 5.0 g/dL   AST 29 15 - 41 U/L   ALT 19 14 -  54 U/L   Alkaline Phosphatase 75 38 - 126 U/L   Total Bilirubin 1.0 0.3 - 1.2 mg/dL   GFR calc non Af Amer 42 (L) >60 mL/min   GFR calc Af Amer 49 (L) >60 mL/min    Comment: (NOTE) The eGFR has been calculated using the CKD EPI equation. This calculation has not been validated in all clinical situations. eGFR's persistently <60 mL/min signify possible Chronic Kidney Disease.    Anion gap 13 5 - 15  CBC     Status: Abnormal   Collection Time: 01/10/15  1:31 AM  Result Value Ref Range   WBC 8.3 4.0 - 10.5 K/uL   RBC 3.82 (L) 3.87 - 5.11 MIL/uL   Hemoglobin 12.3 12.0 - 15.0 g/dL   HCT 18.8 (L) 41.6 - 60.6 %   MCV 91.1 78.0 - 100.0 fL   MCH 32.2 26.0 - 34.0 pg   MCHC 35.3 30.0 - 36.0 g/dL   RDW 30.1 60.1 - 09.3 %   Platelets 220 150 - 400 K/uL  Differential     Status: Abnormal   Collection  Time: 01/10/15  1:31 AM  Result Value Ref Range   Neutrophils Relative % 88 %   Neutro Abs 7.3 1.7 - 7.7 K/uL   Lymphocytes Relative 6 %   Lymphs Abs 0.5 (L) 0.7 - 4.0 K/uL   Monocytes Relative 6 %   Monocytes Absolute 0.5 0.1 - 1.0 K/uL   Eosinophils Relative 0 %   Eosinophils Absolute 0.0 0.0 - 0.7 K/uL   Basophils Relative 0 %   Basophils Absolute 0.0 0.0 - 0.1 K/uL  I-Stat CG4 Lactic Acid, ED     Status: None   Collection Time: 01/10/15  1:37 AM  Result Value Ref Range   Lactic Acid, Venous 1.37 0.5 - 2.0 mmol/L  Urinalysis, Routine w reflex microscopic (not at [facility])     Status: Abnormal   Collection Time: 01/10/15  3:10 AM  Result Value Ref Range   Color, Urine YELLOW YELLOW   APPearance CLEAR CLEAR   Specific Gravity, Urine 1.017 1.005 - 1.030   pH 7.5 5.0 - 8.0   Glucose, UA NEGATIVE NEGATIVE mg/dL   Hgb urine dipstick NEGATIVE NEGATIVE   Bilirubin Urine NEGATIVE NEGATIVE   Ketones, ur NEGATIVE NEGATIVE mg/dL   Protein, ur 147 (A) NEGATIVE mg/dL   Urobilinogen, UA 0.2 0.0 - 1.0 mg/dL   Nitrite NEGATIVE NEGATIVE   Leukocytes, UA NEGATIVE NEGATIVE  Urine microscopic-add on      Status: None   Collection Time: 01/10/15  3:10 AM  Result Value Ref Range   Squamous Epithelial / LPF RARE RARE   RBC / HPF 3-6 <3 RBC/hpf  Glucose, capillary     Status: Abnormal   Collection Time: 01/10/15  7:54 AM  Result Value Ref Range   Glucose-Capillary 141 (H) 65 - 99 mg/dL   Comment 1 Notify RN   Glucose, capillary     Status: Abnormal   Collection Time: 01/10/15 11:19 AM  Result Value Ref Range   Glucose-Capillary 134 (H) 65 - 99 mg/dL   Comment 1 Notify RN   Glucose, capillary     Status: Abnormal   Collection Time: 01/10/15  5:12 PM  Result Value Ref Range   Glucose-Capillary 129 (H) 65 - 99 mg/dL  Glucose, capillary     Status: Abnormal   Collection Time: 01/10/15  9:01 PM  Result Value Ref Range   Glucose-Capillary 116 (H) 65 - 99 mg/dL  Basic metabolic panel     Status: Abnormal   Collection Time: 01/11/15  4:00 AM  Result Value Ref Range   Sodium 136 135 - 145 mmol/L   Potassium 3.3 (L) 3.5 - 5.1 mmol/L    Comment: DELTA CHECK NOTED   Chloride 102 101 - 111 mmol/L   CO2 22 22 - 32 mmol/L   Glucose, Bld 113 (H) 65 - 99 mg/dL   BUN 13 6 - 20 mg/dL   Creatinine, Ser 8.29 0.44 - 1.00 mg/dL   Calcium 9.5 8.9 - 56.2 mg/dL   GFR calc non Af Amer 51 (L) >60 mL/min   GFR calc Af Amer 59 (L) >60 mL/min    Comment: (NOTE) The eGFR has been calculated using the CKD EPI equation. This calculation has not been validated in all clinical situations. eGFR's persistently <60 mL/min signify possible Chronic Kidney Disease.    Anion gap 12 5 - 15  CBC     Status: Abnormal   Collection Time: 01/11/15  4:00 AM  Result Value Ref Range   WBC 5.9 4.0 - 10.5 K/uL  RBC 3.58 (L) 3.87 - 5.11 MIL/uL   Hemoglobin 11.5 (L) 12.0 - 15.0 g/dL   HCT 27.0 (L) 35.0 - 09.3 %   MCV 92.5 78.0 - 100.0 fL   MCH 32.1 26.0 - 34.0 pg   MCHC 34.7 30.0 - 36.0 g/dL   RDW 81.8 29.9 - 37.1 %   Platelets 212 150 - 400 K/uL  Glucose, capillary     Status: Abnormal   Collection Time: 01/11/15   7:47 AM  Result Value Ref Range   Glucose-Capillary 111 (H) 65 - 99 mg/dL   Comment 1 Notify RN     Imaging / Studies: Dg Abd 1 View  01/11/2015   CLINICAL DATA:  Small bowel obstruction  EXAM: ABDOMEN - 1 VIEW  COMPARISON:  CT 01/10/2015  FINDINGS: Prior cholecystectomy. NG tube is in the stomach. No gas-filled dilated small bowel loops noted. No free air organomegaly. No acute bony abnormality.  IMPRESSION: No gas-filled dilated small bowel loops noted. NG tube remains present in the stomach.   Electronically Signed   By: Charlett Nose M.D.   On: 01/11/2015 08:52   Dg Abd 1 View  01/10/2015   CLINICAL DATA:  Assess nasogastric placement.  EXAM: ABDOMEN - 1 VIEW  COMPARISON:  None.  FINDINGS: Nasogastric tube enters the stomach, passes to the fundus and along the greater curvature with its tip in the antrum.  IMPRESSION: Nasogastric tube tip at the gastric antrum.   Electronically Signed   By: Paulina Fusi M.D.   On: 01/10/2015 14:15   Ct Abdomen Pelvis W Contrast  01/10/2015   CLINICAL DATA:  Acute onset of nausea, vomiting and generalized abdominal pain. Initial encounter.  EXAM: CT ABDOMEN AND PELVIS WITH CONTRAST  TECHNIQUE: Multidetector CT imaging of the abdomen and pelvis was performed using the standard protocol following bolus administration of intravenous contrast.  CONTRAST:  75mL OMNIPAQUE IOHEXOL 300 MG/ML  SOLN  COMPARISON:  CT of the abdomen and pelvis performed 05/21/2014  FINDINGS: Minimal right basilar atelectasis is noted. Calcification is noted at the aortic valve.  There is distention of multiple small bowel loops to 3.4 cm in maximal diameter. Trace associated free fluid is seen. Multiple foci of decompression are seen, with decompression of the distal ileum. This likely reflects small bowel dysmotility, though partial small-bowel obstruction cannot be excluded. A small bowel anastomosis at the left mid abdomen is grossly unremarkable.  There is slight extension of a wall of the  transverse colon into a tiny right periumbilical hernia, without evidence for incarceration. A tiny left periumbilical hernia is also seen, containing only fat.  Scattered small hypodensities within the liver are nonspecific but may reflect small cysts. The spleen is unremarkable in appearance. The patient is status post cholecystectomy, with clips noted at the gallbladder fossa. The pancreas and adrenal glands are unremarkable.  Tiny bilateral renal cysts are seen. The kidneys are otherwise unremarkable. There is no evidence of hydronephrosis. No renal or ureteral stones are seen. No perinephric stranding is appreciated.  The stomach is within normal limits. No acute vascular abnormalities are seen. Diffuse calcification is noted along the abdominal aorta and its branches.  The appendix is not definitely seen; there is no evidence for appendicitis. Scattered diverticulosis is noted along the entirety of the colon, without evidence of diverticulitis.  The bladder is mildly distended and grossly unremarkable. The uterus is unremarkable in appearance. The right ovary is unremarkable in appearance. No suspicious adnexal masses are seen. No inguinal lymphadenopathy  is seen.  No acute osseous abnormalities are identified. There is mild chronic compression deformity of vertebral body T11.  IMPRESSION: 1. Distention of multiple small bowel loops to 3.4 cm in maximal diameter, with trace associated free fluid. Multiple foci of decompression noted along the small bowel, with decompression of the distal ileum. This likely reflects some degree of small bowel dysmotility, though partial small bowel obstruction cannot be excluded. 2. Slight extension of the wall of the transverse colon into a tiny right periumbilical hernia, without evidence for incarceration. Tiny left periumbilical hernia also seen, containing only fat. 3. Diffuse calcification along the abdominal aorta and its branches. 4. Scattered diverticulosis along the  entirety of the colon, without evidence of diverticulitis. 5. Calcification at the aortic valve.   Electronically Signed   By: Roanna Raider M.D.   On: 01/10/2015 03:58   Dg Vangie Bicker G Tube Plc W/fl-no Rad  01/10/2015   CLINICAL DATA:    NASO G TUBE PLACEMENT WITH FLUORO  Fluoroscopy was utilized by the requesting physician.  No radiographic  interpretation.     Medications / Allergies:  Scheduled Meds: . amLODipine  10 mg Per Tube Daily  . amoxicillin  500 mg Per Tube 3 times per day  . aspirin  81 mg Per Tube Daily  . cloNIDine  0.1 mg Per Tube BID  . cycloSPORINE  1 drop Both Eyes BID  . fluticasone  2 spray Each Nare Daily  . gabapentin  300 mg Per Tube TID  . heparin  5,000 Units Subcutaneous 3 times per day  . hydrALAZINE  100 mg Per Tube TID  . isosorbide mononitrate  10 mg Per Tube Daily  . isosorbide mononitrate  20 mg Per Tube QHS  . latanoprost  1 drop Both Eyes QHS  . levothyroxine  112.5 mcg Intravenous QAC breakfast  . metoprolol  2.5 mg Intravenous 4 times per day  . mometasone-formoterol  2 puff Inhalation BID  . pantoprazole (PROTONIX) IV  40 mg Intravenous Q12H  . polyvinyl alcohol  1 drop Both Eyes QID  . potassium chloride  10 mEq Intravenous Q1 Hr x 4  . sertraline  100 mg Per Tube Daily  . sodium chloride  1 g Per Tube TID   Continuous Infusions: . 0.9 % NaCl with KCl 20 mEq / L     PRN Meds:.acetaminophen (TYLENOL) oral liquid 160 mg/5 mL, albuterol, hydrALAZINE, iohexol, morphine injection, sodium chloride  Antibiotics: Anti-infectives    Start     Dose/Rate Route Frequency Ordered Stop   01/10/15 2200  amoxicillin (AMOXIL) 250 MG/5ML suspension 250 mg  Status:  Discontinued     250 mg Oral Every 12 hours 01/10/15 1703 01/10/15 1704   01/10/15 2200  amoxicillin (AMOXIL) 250 MG/5ML suspension 250 mg  Status:  Discontinued     250 mg Per Tube Every 12 hours 01/10/15 1704 01/10/15 1725   01/10/15 1730  amoxicillin (AMOXIL) 250 MG/5ML suspension 500 mg      500 mg Per Tube 3 times per day 01/10/15 1725          Assessment/Plan SBO-will start SBO protocol.  Given history of gastrojejunostomy and jejunojejunostomy, ventral hernia repair, we hope to avoid surgery.  Also, the patient states that she does not want surgery should she fail conservative management.   VTE prophylaxis-SCD/heparin  FEN-NPO, IVF, K supplemented    Ashok Norris, ANP-BC [facility] Pager [phone]) For consults and floor pages call [phone]([id number])  01/11/2015 9:49 AM

## 2015-01-11 NOTE — Progress Notes (Addendum)
PROGRESS NOTE    Shannon [name] NUU:[id number] DOB: 1923/10/01 DOA: 01/10/2015 PCP: Kirt Boys, DO  HPI/Brief narrative 81 y.o. female with history of remote gastrojejunostomy & suspected vagotomy, status post GJ anastomosis resection and GJ revision after perforation of anastomotic ulcer 09/2013, G00 01/2014 with EGD 2: #1 with no anatomic outlet obstruction and normal looking pylorus/duodenum, #2: To breakup bezoar, CT 03/12/14: Recurrent bezoar, thickened pyloric channel, EGD 03/19/14: Tortuous/narrowed anatomy at prepylorus/pylorus but no stricture or ulcer, UGI/SBFT 03/25/14: Normal, CT scan 05/21/14: Gastric distention, stomach filled with fluid/food and thickened pyloric wall, barium swallow 05/22/14: Small esophageal hernia, barium trickled into duodenum, eventually assessed to have functional gastric outlet obstruction (follows with [facility]) presented with 4 day history of progressively worsening abdominal pain. Last BM-small one on 10/8, no flatus since 4 days. In ED, CT abdomen suggestive of PSBO versus ileus. Multiple (3) attempts to place NG tube on floor unsuccessful. IR consulted for fluoroscopic guided NG tube placement. CCS consulted. She had teeth pulled on Wed this week, pain control was with Tylenol 3 but she stopped this after Wed.   Assessment/Plan:  PSBO versus ileus - Treating supportively with bowel rest (NPO, NG tube by IR), IV fluids, increased activity and minimize narcotics - Serial clinical exam and abdominal x-rays - Gen. surgery consulted. - IV PPI. - Continues to complain of abdominal pain. No BM or flatus since admission. X-ray abdomen shows resolution of SBO. However paucity of bowel sounds. Continue conservative management at this time. Replace potassium. Surgical follow-up appreciated. 1 L MG output since placed. Prior to that had 3 L emesis.  Essential hypertension  - uncontrolled and fluctuating - Probably difficult to control given polypharmacy that  she is on at home. NG tube inserted by IR. Continue amlodipine, clonidine, hydralazine, nitrates via NG tube with temporary clamping and Toprol-XL changed to scheduled IV. PRN IV hydralazine. Monitor closely.  Type II DM - Not on meds at home. SSI. Reasonable inpatient control.  Hypothyroid - IV Synthroid  Dehydration with hyponatremia - Secondary to poor oral intake and GI losses - Brief IV normal saline hydration - ? History of SIADH. Sodium has normalized. Improved hydration. Continue IV fluids while nothing by mouth and ongoing GI losses.  Stage III chronic kidney disease - Creatinine is at baseline  History of CVA - Continue aspirin  Seizure history - Does not seem to be on medications at home for this.  Hypokalemia - Replace and follow.  Anemia - May be dilutional. Follow CBC in a.m.   DVT prophylaxis: Subcutaneous heparin  Code Status: Full Family Communication: Discussed with daughter [name] 01/11/2015. Disposition Plan: DC home when medically stable    Consultants:  Gen. Surgery   Procedures:  NG tube   Antibiotics:  None    Subjective: States that abdominal pain is better but still has. No BM or flatus since admission. Denies chest pain or dyspnea.  Objective: Filed Vitals:   01/10/15 2100 01/11/15 0500 01/11/15 0759 01/11/15 1030  BP: 175/78 188/95  139/71  Pulse: 101 100  95  Temp: 99.1 F (37.3 C) 99.1 F (37.3 C)  98.2 F (36.8 C)  TempSrc: Oral Oral  Oral  Resp: 20 20  21   SpO2: 98% 100% 97% 100%    Intake/Output Summary (Last 24 hours) at 01/11/15 1100 Last data filed at 01/11/15 1036  Gross per 24 hour  Intake    180 ml  Output   1900 ml  Net  -1720 ml  There were no vitals filed for this visit.   Exam:  General exam: Pleasant elderly female lying comfortably supine in bed. Oral mucosa dry.  Respiratory system: Clear. No increased work of breathing. Cardiovascular system: S1 & S2 heard, RRR. No JVD, murmurs,  gallops, clicks or pedal edema. Gastrointestinal system: abdomen is not distended, midline laparotomy scar, generalized mild tenderness without peritoneal signs and paucity of bowel sounds. Central nervous system: Alert and oriented. No focal neurological deficits. Extremities: Symmetric 5 x 5 power.   Data Reviewed: Basic Metabolic Panel:  Recent Labs Lab 01/10/15 0131 01/11/15 0400  NA 133* 136  K 4.0 3.3*  CL 99* 102  CO2 21* 22  GLUCOSE 136* 113*  BUN 27* 13  CREATININE 1.12* 0.96  CALCIUM 10.3 9.5   Liver Function Tests:  Recent Labs Lab 01/10/15 0131  AST 29  ALT 19  ALKPHOS 75  BILITOT 1.0  PROT 7.6  ALBUMIN 4.0    Recent Labs Lab 01/10/15 0131  LIPASE 21*   No results for input(s): AMMONIA in the last 168 hours. CBC:  Recent Labs Lab 01/10/15 0131 01/11/15 0400  WBC 8.3 5.9  NEUTROABS 7.3  --   HGB 12.3 11.5*  HCT 34.8* 33.1*  MCV 91.1 92.5  PLT 220 212   Cardiac Enzymes: No results for input(s): CKTOTAL, CKMB, CKMBINDEX, TROPONINI in the last 168 hours. BNP (last 3 results)  Recent Labs  01/17/14 2116  PROBNP 961.6*   CBG:  Recent Labs Lab 01/10/15 0754 01/10/15 1119 01/10/15 1712 01/10/15 2101 01/11/15 0747  GLUCAP 141* 134* 129* 116* 111*    No results found for this or any previous visit (from the past 240 hour(s)).       Studies: Dg Abd 1 View  01/11/2015   CLINICAL DATA:  Small bowel obstruction  EXAM: ABDOMEN - 1 VIEW  COMPARISON:  CT 01/10/2015  FINDINGS: Prior cholecystectomy. NG tube is in the stomach. No gas-filled dilated small bowel loops noted. No free air organomegaly. No acute bony abnormality.  IMPRESSION: No gas-filled dilated small bowel loops noted. NG tube remains present in the stomach.   Electronically Signed   By: Charlett Nose M.D.   On: 01/11/2015 08:52   Dg Abd 1 View  01/10/2015   CLINICAL DATA:  Assess nasogastric placement.  EXAM: ABDOMEN - 1 VIEW  COMPARISON:  None.  FINDINGS: Nasogastric tube  enters the stomach, passes to the fundus and along the greater curvature with its tip in the antrum.  IMPRESSION: Nasogastric tube tip at the gastric antrum.   Electronically Signed   By: Paulina Fusi M.D.   On: 01/10/2015 14:15   Ct Abdomen Pelvis W Contrast  01/10/2015   CLINICAL DATA:  Acute onset of nausea, vomiting and generalized abdominal pain. Initial encounter.  EXAM: CT ABDOMEN AND PELVIS WITH CONTRAST  TECHNIQUE: Multidetector CT imaging of the abdomen and pelvis was performed using the standard protocol following bolus administration of intravenous contrast.  CONTRAST:  75mL OMNIPAQUE IOHEXOL 300 MG/ML  SOLN  COMPARISON:  CT of the abdomen and pelvis performed 05/21/2014  FINDINGS: Minimal right basilar atelectasis is noted. Calcification is noted at the aortic valve.  There is distention of multiple small bowel loops to 3.4 cm in maximal diameter. Trace associated free fluid is seen. Multiple foci of decompression are seen, with decompression of the distal ileum. This likely reflects small bowel dysmotility, though partial small-bowel obstruction cannot be excluded. A small bowel anastomosis at the left mid abdomen  is grossly unremarkable.  There is slight extension of a wall of the transverse colon into a tiny right periumbilical hernia, without evidence for incarceration. A tiny left periumbilical hernia is also seen, containing only fat.  Scattered small hypodensities within the liver are nonspecific but may reflect small cysts. The spleen is unremarkable in appearance. The patient is status post cholecystectomy, with clips noted at the gallbladder fossa. The pancreas and adrenal glands are unremarkable.  Tiny bilateral renal cysts are seen. The kidneys are otherwise unremarkable. There is no evidence of hydronephrosis. No renal or ureteral stones are seen. No perinephric stranding is appreciated.  The stomach is within normal limits. No acute vascular abnormalities are seen. Diffuse calcification  is noted along the abdominal aorta and its branches.  The appendix is not definitely seen; there is no evidence for appendicitis. Scattered diverticulosis is noted along the entirety of the colon, without evidence of diverticulitis.  The bladder is mildly distended and grossly unremarkable. The uterus is unremarkable in appearance. The right ovary is unremarkable in appearance. No suspicious adnexal masses are seen. No inguinal lymphadenopathy is seen.  No acute osseous abnormalities are identified. There is mild chronic compression deformity of vertebral body T11.  IMPRESSION: 1. Distention of multiple small bowel loops to 3.4 cm in maximal diameter, with trace associated free fluid. Multiple foci of decompression noted along the small bowel, with decompression of the distal ileum. This likely reflects some degree of small bowel dysmotility, though partial small bowel obstruction cannot be excluded. 2. Slight extension of the wall of the transverse colon into a tiny right periumbilical hernia, without evidence for incarceration. Tiny left periumbilical hernia also seen, containing only fat. 3. Diffuse calcification along the abdominal aorta and its branches. 4. Scattered diverticulosis along the entirety of the colon, without evidence of diverticulitis. 5. Calcification at the aortic valve.   Electronically Signed   By: Roanna Raider M.D.   On: 01/10/2015 03:58   Dg Vangie Bicker G Tube Plc W/fl-no Rad  01/10/2015   CLINICAL DATA:    NASO G TUBE PLACEMENT WITH FLUORO  Fluoroscopy was utilized by the requesting physician.  No radiographic  interpretation.         Scheduled Meds: . amLODipine  10 mg Per Tube Daily  . amoxicillin  500 mg Per Tube 3 times per day  . aspirin  81 mg Per Tube Daily  . cloNIDine  0.1 mg Per Tube BID  . cycloSPORINE  1 drop Both Eyes BID  . diatrizoate meglumine-sodium  90 mL Per NG tube Once  . diatrizoate meglumine-sodium      . fluticasone  2 spray Each Nare Daily  .  gabapentin  300 mg Per Tube TID  . heparin  5,000 Units Subcutaneous 3 times per day  . hydrALAZINE  100 mg Per Tube TID  . isosorbide mononitrate  10 mg Per Tube Daily  . isosorbide mononitrate  20 mg Per Tube QHS  . latanoprost  1 drop Both Eyes QHS  . levothyroxine  112.5 mcg Intravenous QAC breakfast  . metoprolol  2.5 mg Intravenous 4 times per day  . mometasone-formoterol  2 puff Inhalation BID  . pantoprazole (PROTONIX) IV  40 mg Intravenous Q12H  . polyvinyl alcohol  1 drop Both Eyes QID  . potassium chloride  10 mEq Intravenous Q1 Hr x 4  . sertraline  100 mg Per Tube Daily  . sodium chloride  1 g Per Tube TID   Continuous Infusions: . 0.9 %  NaCl with KCl 20 mEq / L 100 mL/hr at 01/11/15 1006    Principal Problem:   SBO (small bowel obstruction) (HCC) Active Problems:   AP (abdominal pain)   SIADH (syndrome of inappropriate ADH production) (HCC)    Time spent: 40 minutes.    Marcellus Scott, MD, FACP, FHM. [facility] Pager [phone]  If 7PM-7AM, please contact night-coverage [email or web address] Password [email or web address] 01/11/2015, 11:00 AM    LOS: 1 day

## 2015-01-12 ENCOUNTER — Encounter (HOSPITAL_COMMUNITY): Payer: Self-pay | Admitting: Cardiology

## 2015-01-12 DIAGNOSIS — R778 Other specified abnormalities of plasma proteins: Secondary | ICD-10-CM | POA: Diagnosis not present

## 2015-01-12 DIAGNOSIS — I160 Hypertensive urgency: Secondary | ICD-10-CM

## 2015-01-12 DIAGNOSIS — I152 Hypertension secondary to endocrine disorders: Secondary | ICD-10-CM | POA: Diagnosis present

## 2015-01-12 DIAGNOSIS — R0789 Other chest pain: Secondary | ICD-10-CM

## 2015-01-12 DIAGNOSIS — K5669 Other intestinal obstruction: Secondary | ICD-10-CM

## 2015-01-12 DIAGNOSIS — E871 Hypo-osmolality and hyponatremia: Secondary | ICD-10-CM

## 2015-01-12 DIAGNOSIS — R7989 Other specified abnormal findings of blood chemistry: Secondary | ICD-10-CM

## 2015-01-12 DIAGNOSIS — E43 Unspecified severe protein-calorie malnutrition: Secondary | ICD-10-CM | POA: Diagnosis present

## 2015-01-12 DIAGNOSIS — E1159 Type 2 diabetes mellitus with other circulatory complications: Secondary | ICD-10-CM | POA: Diagnosis present

## 2015-01-12 DIAGNOSIS — I10 Essential (primary) hypertension: Secondary | ICD-10-CM | POA: Diagnosis present

## 2015-01-12 LAB — GLUCOSE, CAPILLARY
Glucose-Capillary: 126 mg/dL — ABNORMAL HIGH (ref 65–99)
Glucose-Capillary: 75 mg/dL (ref 65–99)
Glucose-Capillary: 79 mg/dL (ref 65–99)
Glucose-Capillary: 79 mg/dL (ref 65–99)
Glucose-Capillary: 82 mg/dL (ref 65–99)
Glucose-Capillary: 83 mg/dL (ref 65–99)
Glucose-Capillary: 88 mg/dL (ref 65–99)
Glucose-Capillary: 95 mg/dL (ref 65–99)

## 2015-01-12 LAB — BASIC METABOLIC PANEL
Anion gap: 12 (ref 5–15)
BUN: 15 mg/dL (ref 6–20)
CO2: 20 mmol/L — ABNORMAL LOW (ref 22–32)
Calcium: 10 mg/dL (ref 8.9–10.3)
Chloride: 106 mmol/L (ref 101–111)
Creatinine, Ser: 0.86 mg/dL (ref 0.44–1.00)
GFR calc Af Amer: >60 mL/min (ref >60)
GFR calc non Af Amer: 58 mL/min — ABNORMAL LOW (ref >60)
Glucose, Bld: 88 mg/dL (ref 65–99)
Potassium: 3.8 mmol/L (ref 3.5–5.1)
Sodium: 138 mmol/L (ref 135–145)

## 2015-01-12 LAB — TROPONIN I
Troponin I: 0.42 ng/mL — ABNORMAL HIGH (ref <0.031)
Troponin I: 0.35 ng/mL — ABNORMAL HIGH (ref <0.031)

## 2015-01-12 LAB — CBC
HCT: 34.4 % — ABNORMAL LOW (ref 36.0–46.0)
Hemoglobin: 12 g/dL (ref 12.0–15.0)
MCH: 32.2 pg (ref 26.0–34.0)
MCHC: 34.9 g/dL (ref 30.0–36.0)
MCV: 92.2 fL (ref 78.0–100.0)
Platelets: 230 10*3/uL (ref 150–400)
RBC: 3.73 MIL/uL — ABNORMAL LOW (ref 3.87–5.11)
RDW: 14.5 % (ref 11.5–15.5)
WBC: 4.7 10*3/uL (ref 4.0–10.5)

## 2015-01-12 MED ORDER — POTASSIUM CHLORIDE IN NACL 20-0.9 MEQ/L-% IV SOLN
INTRAVENOUS | Status: DC
  Administered 2015-01-12: 200 mL via INTRAVENOUS
  Administered 2015-01-12: 1000 mL via INTRAVENOUS
  Administered 2015-01-13: 13:00:00 via INTRAVENOUS
  Filled 2015-01-12 (×3): qty 1000

## 2015-01-12 MED ORDER — NITROGLYCERIN 0.4 MG SL SUBL: 0.4000 mg | SUBLINGUAL_TABLET | SUBLINGUAL | Status: DC | PRN

## 2015-01-12 MED ORDER — METOPROLOL TARTRATE 1 MG/ML IV SOLN
5.0000 mg | Freq: Four times a day (QID) | INTRAVENOUS | Status: DC
  Administered 2015-01-12 – 2015-01-21 (×32): 5 mg via INTRAVENOUS
  Filled 2015-01-12 (×37): qty 5

## 2015-01-12 NOTE — Progress Notes (Signed)
   01/12/15 1600  Clinical Encounter Type  Visited With Patient  Visit Type Initial  Referral From Nurse  Chaplain visited with Pt; Maribel stay a while with Pt and offered prayer; Chaplain plans to return to Pt

## 2015-01-12 NOTE — Progress Notes (Signed)
Subjective: Feels better, no flatus yet  Objective: Vital signs in last 24 hours: Temp:  [98.2 F (36.8 C)-98.7 F (37.1 C)] 98.7 F (37.1 C) (10/11 0530) Pulse Rate:  [77-107] 77 (10/11 0530) Resp:  [16-21] 18 (10/11 0530) BP: (139-206)/(60-90) 200/85 mmHg (10/11 0530) SpO2:  [97 %-100 %] 100 % (10/11 0530) Last BM Date: 01/07/15  Intake/Output from previous day: 10/10 0701 - 10/11 0700 In: 1138.3 [I.V.:868.3; NG/GT:270] Out: 2900 [Urine:1100; Emesis/NG output:1800] Intake/Output this shift:    GI: softer reducible hernia unchanged few bs nontender  Lab Results:   Recent Labs  01/11/15 0400 01/12/15 0425  WBC 5.9 4.7  HGB 11.5* 12.0  HCT 33.1* 34.4*  PLT 212 230   BMET  Recent Labs  01/11/15 0400 01/12/15 0425  NA 136 138  K 3.3* 3.8  CL 102 106  CO2 22 20*  GLUCOSE 113* 88  BUN 13 15  CREATININE 0.96 0.86  CALCIUM 9.5 10.0   PT/INR No results for input(s): LABPROT, INR in the last 72 hours. ABG No results for input(s): PHART, HCO3 in the last 72 hours.  Invalid input(s): PCO2, PO2  Studies/Results: Dg Abd 1 View  01/11/2015   CLINICAL DATA:  Small bowel obstruction  EXAM: ABDOMEN - 1 VIEW  COMPARISON:  CT 01/10/2015  FINDINGS: Prior cholecystectomy. NG tube is in the stomach. No gas-filled dilated small bowel loops noted. No free air organomegaly. No acute bony abnormality.  IMPRESSION: No gas-filled dilated small bowel loops noted. NG tube remains present in the stomach.   Electronically Signed   By: Rolm Baptise M.D.   On: 01/11/2015 08:52   Dg Abd 1 View  01/10/2015   CLINICAL DATA:  Assess nasogastric placement.  EXAM: ABDOMEN - 1 VIEW  COMPARISON:  None.  FINDINGS: Nasogastric tube enters the stomach, passes to the fundus and along the greater curvature with its tip in the antrum.  IMPRESSION: Nasogastric tube tip at the gastric antrum.   Electronically Signed   By: Nelson Chimes M.D.   On: 01/10/2015 14:15   Dg Abd Portable 1v-small Bowel  Obstruction Protocol-initial, 8 Hr Delay  01/11/2015   CLINICAL DATA:  81 year old female with small bowel obstruction. Initial encounter.  EXAM: PORTABLE ABDOMEN - 1 VIEW  COMPARISON:  0831 hr today and earlier.  FINDINGS: Portable AP supine view at 2228 hrs. Stable in NG tube looped in the stomach, side hole the level of the gastric body. Stable cholecystectomy clips. Staple lines re- identified in the left upper abdomen. Continued paucity of bowel gas. Still, mildly dilated up to 3-3.5 cm bowel small bowel loops are evident in the left abdomen. Paucity of colonic gas as before. Stable visualized osseous structures.  IMPRESSION: 1. Stable NG tube. 2. Continued paucity of bowel gas, including colon gas. Residual mildly dilated (approximately 3 cm) small bowel loops evident in the left abdomen.   Electronically Signed   By: Genevie Ann M.D.   On: 01/11/2015 23:06   Dg Loyce Dys Tube Plc W/fl-no Rad  01/10/2015   CLINICAL DATA:    NASO G TUBE PLACEMENT WITH FLUORO  Fluoroscopy was utilized by the requesting physician.  No radiographic  interpretation.     Anti-infectives: Anti-infectives    Start     Dose/Rate Route Frequency Ordered Stop   01/10/15 2200  amoxicillin (AMOXIL) 250 MG/5ML suspension 250 mg  Status:  Discontinued     250 mg Oral Every 12 hours 01/10/15 1703 01/10/15 1704   01/10/15 2200  amoxicillin (AMOXIL) 250 MG/5ML suspension 250 mg  Status:  Discontinued     250 mg Per Tube Every 12 hours 01/10/15 1704 01/10/15 1725   01/10/15 1730  amoxicillin (AMOXIL) 250 MG/5ML suspension 500 mg     500 mg Per Tube 3 times per day 01/10/15 1725        Assessment/Plan: SBO-sbo protocol started but I see no contrast on films. Given history of gastrojejunostomy and jejunojejunostomy, ventral hernia repair, we hope to avoid surgery. Also, the patient states that she does not want surgery should she fail conservative management.  continue ng drainage VTE prophylaxis-SCD/heparin  FEN-NPO,  IVF  [facility] 01/12/2015

## 2015-01-12 NOTE — Progress Notes (Signed)
Addendum  EKG: Sinus tachycardia at 116 bpm with occasional PVCs, normal axis and no acute changes. QTC 428 ms.  Vernell Leep, MD, FACP, FHM. [facility] Pager [phone]  If 7PM-7AM, please contact night-coverage [email or web address] Password [email or web address] 01/12/2015, 2:50 PM

## 2015-01-12 NOTE — Progress Notes (Signed)
Initial Nutrition Assessment  DOCUMENTATION CODES:   Severe malnutrition in context of chronic illness  INTERVENTION:   -RD will follow for diet advancement and supplement diet as appropriate  NUTRITION DIAGNOSIS:   Inadequate oral intake related to altered GI function as evidenced by NPO status.  GOAL:   Patient will meet greater than or equal to 90% of their needs  MONITOR:   PO intake, Supplement acceptance, Diet advancement, Labs, Weight trends, Skin, I & O's  REASON FOR ASSESSMENT:   Malnutrition Screening Tool    ASSESSMENT:   Shannon [name] is a 81 y.o. female with h/o gastric outlet obstruction, SBO, sequela of a perforated pyloric / duodenal ulcer that she had during summer 2015. Patient presents to the ED with c/o gradually worsening, generalized abdominal pain. Symptoms onset 1 day ago. No BM since Thursday at home and unable to pass gas either. She had teeth pulled on Wed this week, pain control was with Tylenol 3 but she stopped this after Wed.  Pt admitted with SBO. NGT connected to low, intermittent suction. Per MD notes, pt and family refuse surgery if pt fails conserative management.   Hx obtained from pt at bedside. She reports she resides at home with her daughter. PTA, she was consuming a pureed diet secondary to receiving tooth extractions last week. She reports she was consume 3 meals daily and consuming 2-3 bottles of Boost daily to supplement her intake.   Pt confirms weight loss due to a general decline in health over the past year. She reports UBW is 150-160#. Documented wt hx reveals a 28# (18.1%) wt loss over the past year, which is consistent with pt report. Current wt of 127.8# verified on bedscale by RD.   Nutrition-Focused physical exam completed. Findings are moderate to severe fat depletion, moderate to severe muscle depletion, and no edema.   Pt understand rationale of NPO order, but is amenable to supplements once diet is advance to optimize  nutritional status.   Labs reviewed.  Diet Order:  Diet NPO time specified Except for: Sips with Meds  Skin:  Reviewed, no issues  Last BM:  01/07/15  Height:   Ht Readings from Last 1 Encounters:  01/12/15 5\' 6"  (1.676 m)    Weight:   Wt Readings from Last 1 Encounters:  01/12/15 127 lb 12.8 oz (57.97 kg)    Ideal Body Weight:  59.1 kg  BMI:  Body mass index is 20.64 kg/(m^2).  Estimated Nutritional Needs:   Kcal:  1400-1600  Protein:  70-85 grams  Fluid:  1.4-1.6 L  EDUCATION NEEDS:   Education needs addressed  Myshawn Chiriboga A. Jimmye Norman, RD, LDN, CDE Pager: [phone] After hours Pager: [phone]

## 2015-01-12 NOTE — Progress Notes (Signed)
Addendum  Troponin positive at 0.42. Cardiology consulted for evaluation.  Vernell Leep, MD, FACP, FHM. [facility] Pager [phone]  If 7PM-7AM, please contact night-coverage [email or web address] Password [email or web address] 01/12/2015, 5:51 PM

## 2015-01-12 NOTE — Consult Note (Signed)
Reason for Consult: abnormal troponin   Referring Physician: Dr. Algis Liming   PCP:  Gildardo Cranker, DO  Primary Cardiologist: Dr. Rubbie Battiest Shannon [name] is an 81 y.o. female.    Chief Complaint: admitted 01/10/15 with increasing abd pain.  HPI: 81 year old female with h/o diabetes, chronic kidney disease stage III with recent exploratory laparotomy with EGD and resection of gastro-jejunal anastomosis, with current difficult to control hypertension and chest pain. Troponin elevated in the hospital 06/2014 at 0.34 in the settings of BP in the 086-761 systolic range.  She had a negative stress test at the end of November 2015 with normal heart function and no prior scar or ischemia.  Echo 12/25/14 with mild LVH, EF 55-60% mild to mod AR, mild MR.  LA mod. Dilated.  Pt admitted on the 9th with increasing abd pain and Dx of SBO vs. Ileus.  Surgery following and with NG pt is improving.  She does not want surgery should she fail conservative management.    Her BP has been difficult to control.  BPs 210/101 to 166/109-- Meds changed to per NG or with metoprolol to IV.  She did have some lt sided chest pain going to the abd.today.  No associated symptoms.  Troponin checked and was 0.42.   EKG S tach with non specific T wave abnormality.  Her pain today was lt sided pressure, could have been gas.  None prior to admit.   Past Medical History  Diagnosis Date  . Pneumonia   . Hyperlipidemia   . Hypertension   . Small bowel obstruction ([place])     "I don't know how many" (01/11/2015)  . Ventral hernia with bowel obstruction   . Asthma   . Hyperlipidemia   . Stroke ([place])     "light one"  . COPD (chronic obstructive pulmonary disease) ([place])   . Hypothyroidism   . Type II diabetes mellitus ([place])   . Anemia   . History of blood transfusion   . GERD (gastroesophageal reflux disease)   . History of stomach ulcers   . Arthritis     "all over"  . Renal insufficiency   . Depression     "light  case"  . SIADH (syndrome of inappropriate ADH production) ([place])     Shannon [name] 01/10/2015    Past Surgical History  Procedure Laterality Date  . Tubal ligation    . Colectomy    . Hernia repair  2015  . Esophagogastroduodenoscopy N/A 01/19/2014    Procedure: ESOPHAGOGASTRODUODENOSCOPY (EGD);  Surgeon: Irene Shipper, MD;  Location: [facility];  Service: Endoscopy;  Laterality: N/A;  . Esophagogastroduodenoscopy N/A 01/20/2014    Procedure: ESOPHAGOGASTRODUODENOSCOPY (EGD);  Surgeon: Irene Shipper, MD;  Location: [facility];  Service: Endoscopy;  Laterality: N/A;  . Esophagogastroduodenoscopy N/A 03/19/2014    Procedure: ESOPHAGOGASTRODUODENOSCOPY (EGD);  Surgeon: Milus Banister, MD;  Location: [facility];  Service: Endoscopy;  Laterality: N/A;  . Cholecystectomy open    . Ventral hernia repair  2015    incarcerated ventral hernia ([facility] 09/2013)/notes 01/10/2015  . Cataract extraction w/ intraocular lens  implant, bilateral    . Gastrojejunostomy      hx/notes 01/10/2015    Family History  Problem Relation Age of Onset  . Stroke Mother   . Heart attack Neg Hx   . Hypertension Mother   . Diabetes Brother    Social History:  reports that she has never smoked. She has quit  using smokeless tobacco. Her smokeless tobacco use included Snuff. She reports that she does not drink alcohol or use illicit drugs.  Allergies: No Known Allergies  OUTPATIENT MEDICATIONS: No current facility-administered medications on file prior to encounter.   Current Outpatient Prescriptions on File Prior to Encounter  Medication Sig Dispense Refill  . acetaminophen (TYLENOL) 325 MG tablet Take 650 mg by mouth every 6 (six) hours as needed for mild pain or fever.    [name] albuterol (PROVENTIL HFA;VENTOLIN HFA) 108 (90 BASE) MCG/ACT inhaler Inhale 2 puffs into the lungs every 6 (six) hours as needed for wheezing or shortness of breath. 1 Inhaler 2  . amLODipine (NORVASC) 10 MG tablet Take 1 tablet (10 mg total) by  mouth daily. For HTN 30 tablet 3  . aspirin EC 81 MG tablet Take 81 mg by mouth every morning.    [name] atorvastatin (LIPITOR) 10 MG tablet TAKE 1 TABLET BY MOUTH EVERY DAY 30 tablet 5  . calcium-vitamin D (OSCAL WITH D) 500-200 MG-UNIT per tablet Take 1 tablet by mouth daily with breakfast.     . chlorhexidine (PERIDEX) 0.12 % solution 15 mLs by Mouth Rinse route 2 (two) times daily. 120 mL 0  . cloNIDine (CATAPRES) 0.1 MG tablet Take 1 tablet (0.1 mg total) by mouth 2 (two) times daily. 60 tablet 11  . feeding supplement, GLUCERNA SHAKE, (GLUCERNA SHAKE) LIQD Take 237 mLs by mouth 3 (three) times daily between meals. 90 Can 1  . fluticasone (FLONASE) 50 MCG/ACT nasal spray Place 2 sprays into both nostrils daily. 16 g 3  . gabapentin (NEURONTIN) 300 MG capsule Take 1 capsule (300 mg total) by mouth 3 (three) times daily. 90 capsule 3  . hydrALAZINE (APRESOLINE) 100 MG tablet TAKE 1 TABLET BY MOUTH 3 TIMES A DAY 90 tablet 5  . isosorbide mononitrate (IMDUR) 30 MG 24 hr tablet Take one tablet by mouth once daily. If Chest Pains occur increase to two tablets daily. 90 tablet 3  . L-Methylfolate-Algae-B12-B6 (METANX) 3-90.314-2-35 MG CAPS TAKE 1 CAPSULE BY MOUTH ONCE DAILY 30 capsule 3  . lactulose (CHRONULAC) 10 GM/15ML solution Take 15 mLs (10 g total) by mouth daily as needed for mild constipation. 240 mL 0  . latanoprost (XALATAN) 0.005 % ophthalmic solution Place 1 drop into both eyes at bedtime. 2.5 mL 12  . levothyroxine (SYNTHROID, LEVOTHROID) 100 MCG tablet Take 1 tablet (100 mcg total) by mouth daily. Take with 151mg dose (total 2234m) 30 tablet 3  . levothyroxine (SYNTHROID, LEVOTHROID) 125 MCG tablet Take 1 tablet (125 mcg total) by mouth daily before breakfast. Take with 10068mdose (total 225m71m30 tablet 3  . metoprolol succinate (TOPROL-XL) 25 MG 24 hr tablet Take 1 tablet (25 mg total) by mouth daily. 30 tablet 3  . Multiple Vitamins-Minerals (MULTIVITAMIN WITH MINERALS) tablet Take 1  tablet by mouth every morning.     . pantoprazole (PROTONIX) 40 MG tablet TAKE 1 TABLET BY MOUTH TWICE A DAY 60 tablet 11  . Polyethyl Glycol-Propyl Glycol 0.4-0.3 % SOLN Place 1 drop into both eyes 4 (four) times daily.     . polyethylene glycol (MIRALAX / GLYCOLAX) packet Take 17 g by mouth 2 (two) times daily.    . RESTASIS 0.05 % ophthalmic emulsion USE 1 DROP INTO BOTH EYES TWICE DAILY 60 mL 1  . sennosides-docusate sodium (SENOKOT-S) 8.6-50 MG tablet Take 1 tablet by mouth 2 (two) times daily.     . sertraline (ZOLOFT) 100 MG tablet Take one  tablet by mouth once daily for depression 30 tablet 3  . sodium chloride 1 G tablet TAKE 1 TABLET BY MOUTH THREE TIMES DAILY 90 tablet 2  . traMADol (ULTRAM) 50 MG tablet Take by mouth 2 (two) times daily as needed for moderate pain.      CURRENT MEDICATIONS: Scheduled Meds: . amLODipine  10 mg Per Tube Daily  . amoxicillin  500 mg Per Tube 3 times per day  . aspirin  81 mg Per Tube Daily  . cloNIDine  0.1 mg Per Tube BID  . cycloSPORINE  1 drop Both Eyes BID  . fluticasone  2 spray Each Nare Daily  . gabapentin  300 mg Per Tube TID  . heparin  5,000 Units Subcutaneous 3 times per day  . hydrALAZINE  100 mg Per Tube TID  . Influenza vac split quadrivalent PF  0.5 mL Intramuscular Tomorrow-1000  . isosorbide mononitrate  10 mg Per Tube Daily  . isosorbide mononitrate  20 mg Per Tube QHS  . latanoprost  1 drop Both Eyes QHS  . levothyroxine  112.5 mcg Intravenous QAC breakfast  . metoprolol  2.5 mg Intravenous 4 times per day  . mometasone-formoterol  2 puff Inhalation BID  . pantoprazole (PROTONIX) IV  40 mg Intravenous Q12H  . polyvinyl alcohol  1 drop Both Eyes QID  . sertraline  100 mg Per Tube Daily  . sodium chloride  1 g Per Tube TID   Continuous Infusions: . 0.9 % NaCl with KCl 20 mEq / L 1,000 mL (01/12/15 1616)   PRN Meds:.acetaminophen (TYLENOL) oral liquid 160 mg/5 mL, albuterol, dextrose, hydrALAZINE, iohexol, morphine  injection, nitroGLYCERIN, sodium chloride    Results for orders placed or performed during the hospital encounter of 01/10/15 (from the past 48 hour(s))  Glucose, capillary     Status: Abnormal   Collection Time: 01/10/15  9:01 PM  Result Value Ref Range   Glucose-Capillary 116 (H) 65 - 99 mg/dL  Basic metabolic panel     Status: Abnormal   Collection Time: 01/11/15  4:00 AM  Result Value Ref Range   Sodium 136 135 - 145 mmol/L   Potassium 3.3 (L) 3.5 - 5.1 mmol/L    Comment: DELTA CHECK NOTED   Chloride 102 101 - 111 mmol/L   CO2 22 22 - 32 mmol/L   Glucose, Bld 113 (H) 65 - 99 mg/dL   BUN 13 6 - 20 mg/dL   Creatinine, Ser 0.96 0.44 - 1.00 mg/dL   Calcium 9.5 8.9 - 10.3 mg/dL   GFR calc non Af Amer 51 (L) >60 mL/min   GFR calc Af Amer 59 (L) >60 mL/min    Comment: (NOTE) The eGFR has been calculated using the CKD EPI equation. This calculation has not been validated in all clinical situations. eGFR's persistently <60 mL/min signify possible Chronic Kidney Disease.    Anion gap 12 5 - 15  CBC     Status: Abnormal   Collection Time: 01/11/15  4:00 AM  Result Value Ref Range   WBC 5.9 4.0 - 10.5 K/uL   RBC 3.58 (L) 3.87 - 5.11 MIL/uL   Hemoglobin 11.5 (L) 12.0 - 15.0 g/dL   HCT 33.1 (L) 36.0 - 46.0 %   MCV 92.5 78.0 - 100.0 fL   MCH 32.1 26.0 - 34.0 pg   MCHC 34.7 30.0 - 36.0 g/dL   RDW 14.6 11.5 - 15.5 %   Platelets 212 150 - 400 K/uL  Glucose, capillary  Status: Abnormal   Collection Time: 01/11/15  7:47 AM  Result Value Ref Range   Glucose-Capillary 111 (H) 65 - 99 mg/dL   Comment 1 Notify RN   Glucose, capillary     Status: None   Collection Time: 01/11/15 11:25 AM  Result Value Ref Range   Glucose-Capillary 96 65 - 99 mg/dL   Comment 1 Notify RN   Glucose, capillary     Status: None   Collection Time: 01/11/15  4:55 PM  Result Value Ref Range   Glucose-Capillary 89 65 - 99 mg/dL   Comment 1 Notify RN   Glucose, capillary     Status: None   Collection  Time: 01/11/15  9:20 PM  Result Value Ref Range   Glucose-Capillary 81 65 - 99 mg/dL  Glucose, capillary     Status: Abnormal   Collection Time: 01/11/15 11:54 PM  Result Value Ref Range   Glucose-Capillary 101 (H) 65 - 99 mg/dL  Glucose, capillary     Status: None   Collection Time: 01/12/15  3:51 AM  Result Value Ref Range   Glucose-Capillary 79 65 - 99 mg/dL  Basic metabolic panel     Status: Abnormal   Collection Time: 01/12/15  4:25 AM  Result Value Ref Range   Sodium 138 135 - 145 mmol/L   Potassium 3.8 3.5 - 5.1 mmol/L   Chloride 106 101 - 111 mmol/L   CO2 20 (L) 22 - 32 mmol/L   Glucose, Bld 88 65 - 99 mg/dL   BUN 15 6 - 20 mg/dL   Creatinine, Ser 0.86 0.44 - 1.00 mg/dL   Calcium 10.0 8.9 - 10.3 mg/dL   GFR calc non Af Amer 58 (L) >60 mL/min   GFR calc Af Amer >60 >60 mL/min    Comment: (NOTE) The eGFR has been calculated using the CKD EPI equation. This calculation has not been validated in all clinical situations. eGFR's persistently <60 mL/min signify possible Chronic Kidney Disease.    Anion gap 12 5 - 15  CBC     Status: Abnormal   Collection Time: 01/12/15  4:25 AM  Result Value Ref Range   WBC 4.7 4.0 - 10.5 K/uL   RBC 3.73 (L) 3.87 - 5.11 MIL/uL   Hemoglobin 12.0 12.0 - 15.0 g/dL   HCT 34.4 (L) 36.0 - 46.0 %   MCV 92.2 78.0 - 100.0 fL   MCH 32.2 26.0 - 34.0 pg   MCHC 34.9 30.0 - 36.0 g/dL   RDW 14.5 11.5 - 15.5 %   Platelets 230 150 - 400 K/uL  Glucose, capillary     Status: None   Collection Time: 01/12/15  5:40 AM  Result Value Ref Range   Glucose-Capillary 95 65 - 99 mg/dL   Comment 1 Notify RN   Glucose, capillary     Status: None   Collection Time: 01/12/15  7:58 AM  Result Value Ref Range   Glucose-Capillary 79 65 - 99 mg/dL   Comment 1 Notify RN   Glucose, capillary     Status: None   Collection Time: 01/12/15 11:44 AM  Result Value Ref Range   Glucose-Capillary 75 65 - 99 mg/dL   Comment 1 Notify RN   Glucose, capillary     Status:  Abnormal   Collection Time: 01/12/15 12:39 PM  Result Value Ref Range   Glucose-Capillary 126 (H) 65 - 99 mg/dL   Comment 1 Notify RN   Glucose, capillary     Status: None  Collection Time: 01/12/15  3:55 PM  Result Value Ref Range   Glucose-Capillary 88 65 - 99 mg/dL   Comment 1 Notify RN   Troponin I (q 6hr x 3)     Status: Abnormal   Collection Time: 01/12/15  3:58 PM  Result Value Ref Range   Troponin I 0.42 (H) <0.031 ng/mL    Comment:        PERSISTENTLY INCREASED TROPONIN VALUES IN THE RANGE OF 0.04-0.49 ng/mL CAN BE SEEN IN:       -UNSTABLE ANGINA       -CONGESTIVE HEART FAILURE       -MYOCARDITIS       -CHEST TRAUMA       -ARRYHTHMIAS       -LATE PRESENTING MYOCARDIAL INFARCTION       -COPD   CLINICAL FOLLOW-UP RECOMMENDED.    Dg Abd 1 View  01/11/2015   CLINICAL DATA:  Small bowel obstruction  EXAM: ABDOMEN - 1 VIEW  COMPARISON:  CT 01/10/2015  FINDINGS: Prior cholecystectomy. NG tube is in the stomach. No gas-filled dilated small bowel loops noted. No free air organomegaly. No acute bony abnormality.  IMPRESSION: No gas-filled dilated small bowel loops noted. NG tube remains present in the stomach.   Electronically Signed   By: Rolm Baptise M.D.   On: 01/11/2015 08:52   Dg Abd Portable 1v-small Bowel Obstruction Protocol-initial, 8 Hr Delay  01/11/2015   CLINICAL DATA:  81 year old female with small bowel obstruction. Initial encounter.  EXAM: PORTABLE ABDOMEN - 1 VIEW  COMPARISON:  0831 hr today and earlier.  FINDINGS: Portable AP supine view at 2228 hrs. Stable in NG tube looped in the stomach, side hole the level of the gastric body. Stable cholecystectomy clips. Staple lines re- identified in the left upper abdomen. Continued paucity of bowel gas. Still, mildly dilated up to 3-3.5 cm bowel small bowel loops are evident in the left abdomen. Paucity of colonic gas as before. Stable visualized osseous structures.  IMPRESSION: 1. Stable NG tube. 2. Continued paucity of  bowel gas, including colon gas. Residual mildly dilated (approximately 3 cm) small bowel loops evident in the left abdomen.   Electronically Signed   By: Genevie Ann M.D.   On: 01/11/2015 23:06    ROS: General:no colds or fevers, + weight loss Skin:no rashes or ulcers HEENT:no blurred vision, no congestion CV:see HPI PUL:see HPI GI:no diarrhea constipation or melena, no indigestion- + abd pain GU:no hematuria, no dysuria MS:no joint pain, no claudication Neuro:no syncope, no lightheadedness [name]:+ diabetes, no thyroid disease, SIADH   Blood pressure 180/91, pulse 117, temperature 98.1 F (36.7 C), temperature source Oral, resp. rate 20, height '5\' 6"'  (1.676 m), weight 127 lb 12.8 oz (57.97 kg), SpO2 100 %.  Wt Readings from Last 3 Encounters:  01/12/15 127 lb 12.8 oz (57.97 kg)  11/06/14 129 lb (58.514 kg)  08/05/14 132 lb (59.875 kg)    PE: General:Pleasant affect, NAD, still with some abd pain Skin:Warm and dry, brisk capillary refill HEENT:normocephalic, sclera clear, mucus membranes moist, NG with yellow brown drainage. Neck:supple, no JVD, no bruits  Heart:S1S2 RRR -rapid without murmur, gallup, rub or click Lungs:clear, ant with upper airway exp wheeze with forced exhale.  without rales, rhonchi, or wheezes PIR:JJOA, + tenderness,  Hypoactive BS, do not palpate liver spleen or masses Ext:no lower ext edema, 2+ pedal pulses, 2+ radial pulses Neuro:alert and oriented X 3, MAE, follows commands, + facial symmetry Tele:  stach.  Assessment/Plan Principal Problem:   SBO (small bowel obstruction) ([facility]) Active Problems:   AP (abdominal pain)   SIADH (syndrome of inappropriate ADH production) ([facility])   Protein-calorie malnutrition, severe   Elevated troponin- may be demand ischemia- follow serial troponin- last admit secondary to HTN, negative nuc study 02/2014.   HTN (hypertension), uncontrolled- needs improved control.    MD to see.  [facility] Practitioner  Certified [place] Pager [phone] or after 5pm or weekends call [phone] 01/12/2015, 7:08 PM   Attending note:  Patient seen and examined. Reviewed records and discussed the case with Ms. Ingold NP. Shannon [name] is currently admitted to the hospital with a small bowel obstruction, NG tube and place for decompression and surgical service following the patient, but no indication for surgical intervention at this time. Blood pressure has been significantly elevated in this setting. She is receiving antihypertensives through the NG tube. Today she developed left-sided chest discomfort, was not sure whether it was anginal or indigestion. ECG reviewed without acute findings, troponin I level however was mildly abnormal at 0.14.  History includes nonobstructive CAD, 40% LAD stenosis documented at cardiac catheterization at outside facility in 2012, and no active ischemia by Myoview from November 2015. She has also had mild troponin I elevations in similar range with prior hypertensive urgency and hospital evaluation.  At the present time she is comfortable, no active chest pain. Still has some abdominal discomfort, no emesis. On examination NG tube is in place, lungs are clear with decreased breath sounds, cardiac exam reveals RRR with ectopic beats and soft systolic murmur, no gallop. Abdomen reveals decreased bowel sounds.  I reviewed her last few office notes from Dr. Meda Coffee. At this time would continue current medical regimen, although increase IV Lopressor 5 mg every 6 hours, next step would be increasing standing clonidine dose. If concern is that she is not getting adequate absorption of her medications with SBO, may need to make more conversions to IV for adequate blood pressure control. Follow-up echocardiogram will be obtained to reassess LVEF in comparison to last year. Would otherwise anticipate conservative medical therapy for now.  Satira Sark, M.D., F.A.C.C.

## 2015-01-12 NOTE — Progress Notes (Addendum)
PROGRESS NOTE    Shannon [name] ION:[id number] DOB: 05-21-1923 DOA: 01/10/2015 PCP: Kirt Boys, DO  HPI/Brief narrative 81 y.o. female with history of remote gastrojejunostomy & suspected vagotomy, status post GJ anastomosis resection and GJ revision after perforation of anastomotic ulcer 09/2013, G00 01/2014 with EGD 2: #1 with no anatomic outlet obstruction and normal looking pylorus/duodenum, #2: To breakup bezoar, CT 03/12/14: Recurrent bezoar, thickened pyloric channel, EGD 03/19/14: Tortuous/narrowed anatomy at prepylorus/pylorus but no stricture or ulcer, UGI/SBFT 03/25/14: Normal, CT scan 05/21/14: Gastric distention, stomach filled with fluid/food and thickened pyloric wall, barium swallow 05/22/14: Small esophageal hernia, barium trickled into duodenum, eventually assessed to have functional gastric outlet obstruction (follows with [facility]) presented with 4 day history of progressively worsening abdominal pain. Last BM-small one on 10/8, no flatus since 4 days. In ED, CT abdomen suggestive of PSBO versus ileus. Multiple (3) attempts to place NG tube on floor unsuccessful. IR consulted for fluoroscopic guided NG tube placement. CCS consulted. She had teeth pulled on Wed this week, pain control was with Tylenol 3 but she stopped this after Wed. Being treated conservatively for partial small bowel obstruction versus ileus. Surgeons following.   Assessment/Plan:  PSBO versus ileus - Treating supportively with bowel rest (NPO, NG tube by IR), IV fluids, increased activity and minimize narcotics - Serial clinical exam and abdominal x-rays - Gen. surgery follow-up appreciated. - IV PPI. - No BM or flatus since admission. X-ray abdomen shows resolution of SBO. Continue conservative management at this time.  - Clinically better with improved pain and improved bowel sounds on exam.  Essential hypertension  - uncontrolled and fluctuating - Probably difficult to control given polypharmacy  that she is on at home. NG tube inserted by IR. Continue amlodipine, clonidine, hydralazine, nitrates via NG tube with temporary clamping and Toprol-XL changed to scheduled IV. PRN IV hydralazine. Monitor closely.  Type II DM - Not on meds at home. SSI. Reasonable inpatient control.  Hypothyroid - IV Synthroid  Dehydration with hyponatremia - Secondary to poor oral intake and GI losses - Brief IV normal saline hydration - ? History of SIADH. Sodium has normalized. Improved hydration. Continue IV fluids while nothing by mouth and ongoing GI losses.  Stage III chronic kidney disease - Creatinine is at baseline  History of CVA - Continue aspirin  Seizure history - Does not seem to be on medications at home for this.  Hypokalemia - Replace and follow as needed.  Anemia - stable  Atypical chest pain - Patient complained of pressure-like left-sided chest pain radiating to abdomen this afternoon. No dyspnea. Will get EKG, troponin and try sublingual NTG. Likely GI in origin. Monitor.  Severe malnutrition in context of chronic illness -Management per dietitian. Hopefully may be able to start oral intake soon.  DVT prophylaxis: Subcutaneous heparin  Code Status: Full Family Communication: Discussed with daughter [name] 10/110/2016. Disposition Plan: DC home when medically stable    Consultants:  Gen. Surgery   Procedures:  NG tube   Antibiotics:  None    Subjective: Stated this morning that abdominal pain was better. Still no flatus or BM. Denied any other complaints.  Objective: Filed Vitals:   01/12/15 1100 01/12/15 1123 01/12/15 1255 01/12/15 1330  BP: 184/72  210/101 180/75  Pulse: 76     Temp:      TempSrc:      Resp: 18     Height:  5\' 6"  (1.676 m)    Weight:  57.97 kg (  127 lb 12.8 oz)    SpO2: 100%       Intake/Output Summary (Last 24 hours) at 01/12/15 1402 Last data filed at 01/12/15 1106  Gross per 24 hour  Intake 868.33 ml    Output   1650 ml  Net -781.67 ml   Filed Weights   01/12/15 1123  Weight: 57.97 kg (127 lb 12.8 oz)     Exam:  General exam: Pleasant elderly female lying comfortably supine in bed. Oral mucosa borderline hydration. Respiratory system: Clear. No increased work of breathing. Cardiovascular system: S1 & S2 heard, RRR. No JVD, murmurs, gallops, clicks or pedal edema. Gastrointestinal system: abdomen is not distended, midline laparotomy scar, nontender and improved bowel sounds. Central nervous system: Alert and oriented. No focal neurological deficits. Extremities: Symmetric 5 x 5 power.   Data Reviewed: Basic Metabolic Panel:  Recent Labs Lab 01/10/15 0131 01/11/15 0400 01/12/15 0425  NA 133* 136 138  K 4.0 3.3* 3.8  CL 99* 102 106  CO2 21* 22 20*  GLUCOSE 136* 113* 88  BUN 27* 13 15  CREATININE 1.12* 0.96 0.86  CALCIUM 10.3 9.5 10.0   Liver Function Tests:  Recent Labs Lab 01/10/15 0131  AST 29  ALT 19  ALKPHOS 75  BILITOT 1.0  PROT 7.6  ALBUMIN 4.0    Recent Labs Lab 01/10/15 0131  LIPASE 21*   No results for input(s): AMMONIA in the last 168 hours. CBC:  Recent Labs Lab 01/10/15 0131 01/11/15 0400 01/12/15 0425  WBC 8.3 5.9 4.7  NEUTROABS 7.3  --   --   HGB 12.3 11.5* 12.0  HCT 34.8* 33.1* 34.4*  MCV 91.1 92.5 92.2  PLT 220 212 230   Cardiac Enzymes: No results for input(s): CKTOTAL, CKMB, CKMBINDEX, TROPONINI in the last 168 hours. BNP (last 3 results)  Recent Labs  01/17/14 2116  PROBNP 961.6*   CBG:  Recent Labs Lab 01/12/15 0351 01/12/15 0540 01/12/15 0758 01/12/15 1144 01/12/15 1239  GLUCAP 79 95 79 75 126*    No results found for this or any previous visit (from the past 240 hour(s)).       Studies: Dg Abd 1 View  01/11/2015   CLINICAL DATA:  Small bowel obstruction  EXAM: ABDOMEN - 1 VIEW  COMPARISON:  CT 01/10/2015  FINDINGS: Prior cholecystectomy. NG tube is in the stomach. No gas-filled dilated small bowel  loops noted. No free air organomegaly. No acute bony abnormality.  IMPRESSION: No gas-filled dilated small bowel loops noted. NG tube remains present in the stomach.   Electronically Signed   By: Charlett Nose M.D.   On: 01/11/2015 08:52   Dg Abd 1 View  01/10/2015   CLINICAL DATA:  Assess nasogastric placement.  EXAM: ABDOMEN - 1 VIEW  COMPARISON:  None.  FINDINGS: Nasogastric tube enters the stomach, passes to the fundus and along the greater curvature with its tip in the antrum.  IMPRESSION: Nasogastric tube tip at the gastric antrum.   Electronically Signed   By: Paulina Fusi M.D.   On: 01/10/2015 14:15   Dg Abd Portable 1v-small Bowel Obstruction Protocol-initial, 8 Hr Delay  01/11/2015   CLINICAL DATA:  81 year old female with small bowel obstruction. Initial encounter.  EXAM: PORTABLE ABDOMEN - 1 VIEW  COMPARISON:  0831 hr today and earlier.  FINDINGS: Portable AP supine view at 2228 hrs. Stable in NG tube looped in the stomach, side hole the level of the gastric body. Stable cholecystectomy clips. Staple lines re-  identified in the left upper abdomen. Continued paucity of bowel gas. Still, mildly dilated up to 3-3.5 cm bowel small bowel loops are evident in the left abdomen. Paucity of colonic gas as before. Stable visualized osseous structures.  IMPRESSION: 1. Stable NG tube. 2. Continued paucity of bowel gas, including colon gas. Residual mildly dilated (approximately 3 cm) small bowel loops evident in the left abdomen.   Electronically Signed   By: Odessa Fleming M.D.   On: 01/11/2015 23:06   Dg Basil Dess Tube Plc W/fl-no Rad  01/10/2015   CLINICAL DATA:    NASO G TUBE PLACEMENT WITH FLUORO  Fluoroscopy was utilized by the requesting physician.  No radiographic  interpretation.         Scheduled Meds: . amLODipine  10 mg Per Tube Daily  . amoxicillin  500 mg Per Tube 3 times per day  . aspirin  81 mg Per Tube Daily  . cloNIDine  0.1 mg Per Tube BID  . cycloSPORINE  1 drop Both Eyes BID  .  fluticasone  2 spray Each Nare Daily  . gabapentin  300 mg Per Tube TID  . heparin  5,000 Units Subcutaneous 3 times per day  . hydrALAZINE  100 mg Per Tube TID  . Influenza vac split quadrivalent PF  0.5 mL Intramuscular Tomorrow-1000  . isosorbide mononitrate  10 mg Per Tube Daily  . isosorbide mononitrate  20 mg Per Tube QHS  . latanoprost  1 drop Both Eyes QHS  . levothyroxine  112.5 mcg Intravenous QAC breakfast  . metoprolol  2.5 mg Intravenous 4 times per day  . mometasone-formoterol  2 puff Inhalation BID  . pantoprazole (PROTONIX) IV  40 mg Intravenous Q12H  . polyvinyl alcohol  1 drop Both Eyes QID  . sertraline  100 mg Per Tube Daily  . sodium chloride  1 g Per Tube TID   Continuous Infusions:    Principal Problem:   SBO (small bowel obstruction) (HCC) Active Problems:   AP (abdominal pain)   SIADH (syndrome of inappropriate ADH production) (HCC)    Time spent: 30 minutes.    Marcellus Scott, MD, FACP, FHM. [facility] Pager [phone]  If 7PM-7AM, please contact night-coverage [email or web address] Password [email or web address] 01/12/2015, 2:02 PM    LOS: 2 days

## 2015-01-13 ENCOUNTER — Ambulatory Visit: Payer: Medicare Other | Admitting: Neurology

## 2015-01-13 ENCOUNTER — Inpatient Hospital Stay (HOSPITAL_COMMUNITY): Payer: Medicare Other

## 2015-01-13 DIAGNOSIS — I10 Essential (primary) hypertension: Secondary | ICD-10-CM

## 2015-01-13 LAB — TROPONIN I: Troponin I: 0.24 ng/mL — ABNORMAL HIGH (ref <0.031)

## 2015-01-13 LAB — GLUCOSE, CAPILLARY
Glucose-Capillary: 93 mg/dL (ref 65–99)
Glucose-Capillary: 82 mg/dL (ref 65–99)
Glucose-Capillary: 97 mg/dL (ref 65–99)
Glucose-Capillary: 109 mg/dL — ABNORMAL HIGH (ref 65–99)

## 2015-01-13 LAB — BASIC METABOLIC PANEL
Anion gap: 13 (ref 5–15)
BUN: 16 mg/dL (ref 6–20)
CO2: 19 mmol/L — ABNORMAL LOW (ref 22–32)
Calcium: 10 mg/dL (ref 8.9–10.3)
Chloride: 107 mmol/L (ref 101–111)
Creatinine, Ser: 0.89 mg/dL (ref 0.44–1.00)
GFR calc Af Amer: >60 mL/min (ref >60)
GFR calc non Af Amer: 56 mL/min — ABNORMAL LOW (ref >60)
Glucose, Bld: 91 mg/dL (ref 65–99)
Potassium: 4.1 mmol/L (ref 3.5–5.1)
Sodium: 139 mmol/L (ref 135–145)

## 2015-01-13 MED ORDER — HYDRALAZINE HCL 20 MG/ML IJ SOLN
10.0000 mg | Freq: Four times a day (QID) | INTRAMUSCULAR | Status: DC
  Administered 2015-01-13 – 2015-01-21 (×30): 10 mg via INTRAVENOUS
  Filled 2015-01-13 (×2): qty 1
  Filled 2015-01-13 (×2): qty 0.5
  Filled 2015-01-13 (×12): qty 1
  Filled 2015-01-13: qty 0.5
  Filled 2015-01-13 (×17): qty 1
  Filled 2015-01-13: qty 0.5

## 2015-01-13 MED ORDER — NITROGLYCERIN 0.4 MG/HR TD PT24
0.4000 mg | MEDICATED_PATCH | Freq: Every day | TRANSDERMAL | Status: DC
  Administered 2015-01-13 – 2015-01-14 (×2): 0.4 mg via TRANSDERMAL
  Filled 2015-01-13 (×2): qty 1

## 2015-01-13 MED ORDER — KCL IN DEXTROSE-NACL 20-5-0.9 MEQ/L-%-% IV SOLN
INTRAVENOUS | Status: AC
  Administered 2015-01-13 – 2015-01-19 (×7): via INTRAVENOUS
  Filled 2015-01-13 (×11): qty 1000

## 2015-01-13 MED ORDER — CLONIDINE HCL 0.2 MG/24HR TD PTWK
0.2000 mg | MEDICATED_PATCH | TRANSDERMAL | Status: DC
  Filled 2015-01-13 (×2): qty 1

## 2015-01-13 MED ORDER — ENALAPRILAT 1.25 MG/ML IV SOLN
0.6250 mg | Freq: Four times a day (QID) | INTRAVENOUS | Status: DC
  Administered 2015-01-13 – 2015-01-14 (×5): 0.625 mg via INTRAVENOUS
  Filled 2015-01-13 (×8): qty 0.5

## 2015-01-13 NOTE — Care Management Important Message (Signed)
Important Message  Patient Details  Name: Shannon [name] MRN: [id number] Date of Birth: 16-Mar-1924   Medicare Important Message Given:  Yes-second notification given    Delorse Lek 01/13/2015, 10:33 AM

## 2015-01-13 NOTE — Progress Notes (Signed)
Patient ID: Shannon [name], female   DOB: Mar 22, 1924, 81 y.o.   MRN: [id number]    Subjective: Pt says she feels better, but still no flatus or BM.  Objective: Vital signs in last 24 hours: Temp:  [97.8 F (36.6 C)-98.3 F (36.8 C)] 98.3 F (36.8 C) (10/12 0523) Pulse Rate:  [73-124] 73 (10/12 0523) Resp:  [15-20] 15 (10/12 0523) BP: (166-210)/(72-109) 203/83 mmHg (10/12 0745) SpO2:  [96 %-100 %] 99 % (10/12 0523) Weight:  [57.97 kg (127 lb 12.8 oz)] 57.97 kg (127 lb 12.8 oz) (10/11 1123) Last BM Date: 01/07/15  Intake/Output from previous day: 10/11 0701 - 10/12 0700 In: 2012.7 [P.O.:200; I.V.:1812.7] Out: 1000 [Urine:200; Emesis/NG output:800] Intake/Output this shift:    PE: Abd: soft, still tender diffuse, but no guarding or rebounding, hypoactive BS, ND, NGT with feculent appearing output Heart: irregular, tele appears to say sinus, but she goes from 80s to 130s and back down.  She appears to have a saw tooth pattern like she is going in and out of a flutter when her HR spikes.   Lungs: CTAB  Lab Results:   Recent Labs  01/11/15 0400 01/12/15 0425  WBC 5.9 4.7  HGB 11.5* 12.0  HCT 33.1* 34.4*  PLT 212 230   BMET  Recent Labs  01/12/15 0425 01/13/15 0232  NA 138 139  K 3.8 4.1  CL 106 107  CO2 20* 19*  GLUCOSE 88 91  BUN 15 16  CREATININE 0.86 0.89  CALCIUM 10.0 10.0   PT/INR No results for input(s): LABPROT, INR in the last 72 hours. CMP     Component Value Date/Time   NA 139 01/13/2015 0232   NA 137 11/02/2014 1029   K 4.1 01/13/2015 0232   CL 107 01/13/2015 0232   CO2 19* 01/13/2015 0232   GLUCOSE 91 01/13/2015 0232   GLUCOSE 94 11/02/2014 1029   BUN 16 01/13/2015 0232   BUN 31* 11/02/2014 1029   CREATININE 0.89 01/13/2015 0232   CREATININE 1.3* 05/06/2014   CALCIUM 10.0 01/13/2015 0232   PROT 7.6 01/10/2015 0131   PROT 6.8 11/02/2014 1029   ALBUMIN 4.0 01/10/2015 0131   ALBUMIN 4.1 11/02/2014 1029   AST 29 01/10/2015 0131   ALT 19  01/10/2015 0131   ALKPHOS 75 01/10/2015 0131   BILITOT 1.0 01/10/2015 0131   BILITOT 0.3 11/02/2014 1029   GFRNONAA 56* 01/13/2015 0232   GFRAA >60 01/13/2015 0232   Lipase     Component Value Date/Time   LIPASE 21* 01/10/2015 0131       Studies/Results: Dg Abd 1 View  01/11/2015  CLINICAL DATA:  Small bowel obstruction EXAM: ABDOMEN - 1 VIEW COMPARISON:  CT 01/10/2015 FINDINGS: Prior cholecystectomy. NG tube is in the stomach. No gas-filled dilated small bowel loops noted. No free air organomegaly. No acute bony abnormality. IMPRESSION: No gas-filled dilated small bowel loops noted. NG tube remains present in the stomach. Electronically Signed   By: Charlett Nose M.D.   On: 01/11/2015 08:52   Dg Abd Portable 1v-small Bowel Obstruction Protocol-initial, 8 Hr Delay  01/11/2015  CLINICAL DATA:  81 year old female with small bowel obstruction. Initial encounter. EXAM: PORTABLE ABDOMEN - 1 VIEW COMPARISON:  0831 hr today and earlier. FINDINGS: Portable AP supine view at 2228 hrs. Stable in NG tube looped in the stomach, side hole the level of the gastric body. Stable cholecystectomy clips. Staple lines re- identified in the left upper abdomen. Continued paucity of bowel gas. Still, mildly  dilated up to 3-3.5 cm bowel small bowel loops are evident in the left abdomen. Paucity of colonic gas as before. Stable visualized osseous structures. IMPRESSION: 1. Stable NG tube. 2. Continued paucity of bowel gas, including colon gas. Residual mildly dilated (approximately 3 cm) small bowel loops evident in the left abdomen. Electronically Signed   By: Odessa Fleming M.D.   On: 01/11/2015 23:06    Anti-infectives: Anti-infectives    Start     Dose/Rate Route Frequency Ordered Stop   01/10/15 2200  amoxicillin (AMOXIL) 250 MG/5ML suspension 250 mg  Status:  Discontinued     250 mg Oral Every 12 hours 01/10/15 1703 01/10/15 1704   01/10/15 2200  amoxicillin (AMOXIL) 250 MG/5ML suspension 250 mg  Status:   Discontinued     250 mg Per Tube Every 12 hours 01/10/15 1704 01/10/15 1725   01/10/15 1730  amoxicillin (AMOXIL) 250 MG/5ML suspension 500 mg     500 mg Per Tube 3 times per day 01/10/15 1725         Assessment/Plan   SBO -repeat films today.  Cont NGT as obstruction as not resolved -mobilize as able, likely needs some PT -patient still says she doesn't want an operation if her obstruction were to fail to resolve conservatively.  She would be an ideal candidate for palliative care to see to determine goals of care in case she does not improve. Irregular heart rate -EKG pending -will alert medicine, but likely watch unless new findings VTE prophylaxis-SCD/heparin  FEN-NPO, IVF  LOS: 3 days    Hemi Chacko E 01/13/2015, 8:29 AM Pager: [phone]

## 2015-01-13 NOTE — Progress Notes (Signed)
Patient Name: Shannon [name] Date of Encounter: 01/13/2015  Principal Problem:   SBO (small bowel obstruction) (HCC) Active Problems:   AP (abdominal pain)   SIADH (syndrome of inappropriate ADH production) (HCC)   Protein-calorie malnutrition, severe   Elevated troponin   HTN (hypertension), uncontrolled   Primary Cardiologist: Dr. Meda Coffee Patient Profile: 81 yo female w/ PMH of Type 2 DM, CKD Stage 3, nonobstructive CAD and HTN admitted on 01/10/2015 for SBO (NG tube in place, no surgical intervention thus far). BP's elevated to 210's/100's. Troponin peak at .42.  SUBJECTIVE: Feeling better, but fatigued. No chest pain, shortness of breath, or palpitations.  OBJECTIVE Filed Vitals:   01/12/15 2248 01/13/15 0520 01/13/15 0523 01/13/15 0745  BP: 204/76 208/94 208/94 203/83  Pulse: 112  73   Temp: 97.8 F (36.6 C)  98.3 F (36.8 C)   TempSrc: Oral     Resp: 17  15   Height:      Weight:      SpO2: 96%  99%     Intake/Output Summary (Last 24 hours) at 01/13/15 [id number] Last data filed at 01/13/15 0500  Gross per 24 hour  Intake 2012.67 ml  Output   1000 ml  Net 1012.67 ml   Filed Weights   01/12/15 1123  Weight: 127 lb 12.8 oz (57.97 kg)    PHYSICAL EXAM General: Well developed, well nourished, female in no acute distress. Head: Normocephalic, atraumatic. NG tube in place.  Neck: Supple without bruits, JVD not elevated. Lungs:  Resp regular and unlabored, CTA without wheezing or rales. Heart: RRR, S1, S2, no S3, S4, or murmur; no rub. Abdomen: Soft, non-tender, non-distended with normoactive bowel sounds. No hepatomegaly. No rebound/guarding. No obvious abdominal masses. Extremities: No clubbing, cyanosis, or edema. Distal pedal pulses are 2+ bilaterally. Neuro: Alert and oriented X 3. Moves all extremities spontaneously. Psych: Normal affect.   LABS: CBC: Recent Labs  01/11/15 0400 01/12/15 0425  WBC 5.9 4.7  HGB 11.5* 12.0  HCT 33.1* 34.4*  MCV 92.5  92.2  PLT 212 [id number]   Basic Metabolic Panel: Recent Labs  01/12/15 0425 01/13/15 0232  NA 138 139  K 3.8 4.1  CL 106 107  CO2 20* 19*  GLUCOSE 88 91  BUN 15 16  CREATININE 0.86 0.89  CALCIUM 10.0 10.0   Cardiac Enzymes: Recent Labs  01/12/15 1558 01/12/15 2102 01/13/15 0232  TROPONINI 0.42* 0.35* 0.24*   No results for input(s): TROPIPOC in the last 72 hours. BNP:  B NATRIURETIC PEPTIDE  Date/Time Value Ref Range Status  05/27/2014 04:30 PM 195.7* 0.0 - 100.0 pg/mL Final   TELE:    Sinus tachycardia with rates into the 120's. Frequent PVC's. No evidence of atrial fibrillation.    ECG: Sinus rhythm with anterior T-wave inversion in V1 and V2.  Radiology/Studies: Dg Abd Portable 1v-small Bowel Obstruction Protocol-initial, 8 Hr Delay: 01/11/2015  CLINICAL DATA:  81 year old female with small bowel obstruction. Initial encounter. EXAM: PORTABLE ABDOMEN - 1 VIEW COMPARISON:  0831 hr today and earlier. FINDINGS: Portable AP supine view at 2228 hrs. Stable in NG tube looped in the stomach, side hole the level of the gastric body. Stable cholecystectomy clips. Staple lines re- identified in the left upper abdomen. Continued paucity of bowel gas. Still, mildly dilated up to 3-3.5 cm bowel small bowel loops are evident in the left abdomen. Paucity of colonic gas as before. Stable visualized osseous structures. IMPRESSION: 1. Stable NG tube. 2. Continued paucity of  bowel gas, including colon gas. Residual mildly dilated (approximately 3 cm) small bowel loops evident in the left abdomen. Electronically Signed   By: Genevie Ann M.D.   On: 01/11/2015 23:06    Current Medications:  . amLODipine  10 mg Per Tube Daily  . amoxicillin  500 mg Per Tube 3 times per day  . aspirin  81 mg Per Tube Daily  . cloNIDine  0.1 mg Per Tube BID  . cycloSPORINE  1 drop Both Eyes BID  . fluticasone  2 spray Each Nare Daily  . gabapentin  300 mg Per Tube TID  . heparin  5,000 Units Subcutaneous 3 times per  day  . hydrALAZINE  100 mg Per Tube TID  . Influenza vac split quadrivalent PF  0.5 mL Intramuscular Tomorrow-1000  . isosorbide mononitrate  10 mg Per Tube Daily  . isosorbide mononitrate  20 mg Per Tube QHS  . latanoprost  1 drop Both Eyes QHS  . levothyroxine  112.5 mcg Intravenous QAC breakfast  . metoprolol  5 mg Intravenous 4 times per day  . mometasone-formoterol  2 puff Inhalation BID  . pantoprazole (PROTONIX) IV  40 mg Intravenous Q12H  . polyvinyl alcohol  1 drop Both Eyes QID  . sertraline  100 mg Per Tube Daily  . sodium chloride  1 g Per Tube TID   . 0.9 % NaCl with KCl 20 mEq / L 1,000 mL (01/12/15 1616)    ASSESSMENT AND PLAN:  1. Uncontrolled HTN - BP has been 166/72 - 210/109 in the past 24 hours. - IV Lopressor recently increased to 5mg  Q6H.  - Has been receiving Clonidine 0.1mg  BID per tube, Hydralazine 100mg  TID per tube, and Amlodipine 10mg  daily per tube. Will switch Clonidine to 0.2mg  patch and Hydralazine to 10mg  Q6H IV in an effort to allow for better absorption.  2. Elevated Troponin - peaked at 0.42. EKG shows new anterior T-wave inversion in anterior leads.  - cardiac catheterization in 2012 showed nonobstructive CAD, 40% LAD stenosis. Myoview in 02/2014 showed no active ischemia. - could consider follow-up echocardiogram. Last reading in 12/2013 showed EF of 55-60% with mild LVH, moderate aortic regurgitation, mild mitral regurgitation, moderately dilated LA, and mildly dilated RA.   3. SBO (small bowel obstruction) ([place]) - per surgical service   Signed, Erma Heritage , PA-C 9:03 AM 01/13/2015 Pager: [phone] Agree with note by Guinevere Scarlet  Pt with H/O HTN , non obstructive CAD by cath, Nl LV Fxn with Mod AI admitted with SBO. Now NPO with NGT. BP elevated. Trop mildly elevated with new Ant TWI. Will change clonidine to TTS, Hydralazine to IV until SBO resolves. Can re check 2D for RWMA. Will follow.  Lorretta Harp, M.D.,  [place], [facility], [place] [address] [place], [place]  [address]  [phone] 01/13/2015 10:00 AM

## 2015-01-13 NOTE — Progress Notes (Signed)
PROGRESS NOTE    Shannon [name] GHW:[id number] DOB: 1923-10-30 DOA: 01/10/2015 PCP: Kirt Boys, DO  HPI/Brief narrative 81 y.o. female with history of remote gastrojejunostomy & suspected vagotomy, status post GJ anastomosis resection and GJ revision after perforation of anastomotic ulcer 09/2013, G00 01/2014 with EGD 2: #1 with no anatomic outlet obstruction and normal looking pylorus/duodenum, #2: To breakup bezoar, CT 03/12/14: Recurrent bezoar, thickened pyloric channel, EGD 03/19/14: Tortuous/narrowed anatomy at prepylorus/pylorus but no stricture or ulcer, UGI/SBFT 03/25/14: Normal, CT scan 05/21/14: Gastric distention, stomach filled with fluid/food and thickened pyloric wall, barium swallow 05/22/14: Small esophageal hernia, barium trickled into duodenum, eventually assessed to have functional gastric outlet obstruction (follows with [facility]) presented with 4 day history of progressively worsening abdominal pain. Last BM-small one on 10/8, no flatus since 4 days. In ED, CT abdomen suggestive of PSBO versus ileus. Multiple (3) attempts to place NG tube on floor unsuccessful. IR consulted for fluoroscopic guided NG tube placement. CCS consulted. She had teeth pulled on Wed this week, pain control was with Tylenol 3 but she stopped this after Wed..   Assessment/Plan:  PSBO  Not improving with conservative management. Does not want surgery. General surgery recommending palliative consult. Agree. Prognosis appears poor. Would be hospice appropriate.  Essential hypertension  Difficult to control. On multiple pgt meds. Doubt GI absorption is reliable. Change nitro to patch. Add vasotec IV. Cardiology has ordered clonidine TTS. Daughter reports it "burned her skin" and refuses placement.  Type II DM CBG low this afternoon. Add dextrose to IVF  Hypothyroid - IV Synthroid  Dehydration with hyponatremia resolved  Stage III chronic kidney disease - Creatinine is at baseline  History  of CVA - Continue aspirin  Hypokalemia corrected  Anemia - stable  Atypical chest pain troponins abnormal. Cardiology following  Tachycardia: no definite fib/flutter. Continue metoprolol  Severe malnutrition in context of chronic illness NPO for almost a week  DVT prophylaxis: Subcutaneous heparin  Code Status: Full Family Communication: Discussed with daughter [name] 10/110/2016. Disposition Plan:    Consultants:  Gen. Surgery   palliative  Procedures:    Antibiotics:  None    Subjective: No CP. No dyspnea. No bowel movment. Occasional nausea.  Objective: Filed Vitals:   01/13/15 0942 01/13/15 1140 01/13/15 1415 01/13/15 1417  BP: 234/91 187/68 180/69 194/79  Pulse:  74 79 84  Temp:   98.2 F (36.8 C)   TempSrc:   Oral   Resp:   16   Height:      Weight:      SpO2:   99%     Intake/Output Summary (Last 24 hours) at 01/13/15 1608 Last data filed at 01/13/15 0940  Gross per 24 hour  Intake 1502.67 ml  Output    800 ml  Net 702.67 ml   Filed Weights   01/12/15 1123  Weight: 57.97 kg (127 lb 12.8 oz)     Exam:  General exam: frail. Cooperative. Oriented. Wet vocal quality. Respiratory system: CTA without WRR Cardiovascular system: RRR without MGR. Gastrointestinal system: abd. Soft, nontender. Diminished BS. Extremities: no CCE   Data Reviewed: Basic Metabolic Panel:  Recent Labs Lab 01/10/15 0131 01/11/15 0400 01/12/15 0425 01/13/15 0232  NA 133* 136 138 139  K 4.0 3.3* 3.8 4.1  CL 99* 102 106 107  CO2 21* 22 20* 19*  GLUCOSE 136* 113* 88 91  BUN 27* 13 15 16   CREATININE 1.12* 0.96 0.86 0.89  CALCIUM 10.3 9.5 10.0 10.0  Liver Function Tests:  Recent Labs Lab 01/10/15 0131  AST 29  ALT 19  ALKPHOS 75  BILITOT 1.0  PROT 7.6  ALBUMIN 4.0    Recent Labs Lab 01/10/15 0131  LIPASE 21*   No results for input(s): AMMONIA in the last 168 hours. CBC:  Recent Labs Lab 01/10/15 0131 01/11/15 0400  01/12/15 0425  WBC 8.3 5.9 4.7  NEUTROABS 7.3  --   --   HGB 12.3 11.5* 12.0  HCT 34.8* 33.1* 34.4*  MCV 91.1 92.5 92.2  PLT 220 212 230   Cardiac Enzymes:  Recent Labs Lab 01/12/15 1558 01/12/15 2102 01/13/15 0232  TROPONINI 0.42* 0.35* 0.24*   BNP (last 3 results)  Recent Labs  01/17/14 2116  PROBNP 961.6*   CBG:  Recent Labs Lab 01/12/15 1555 01/12/15 2028 01/12/15 2351 01/13/15 0424 01/13/15 0724  GLUCAP 88 83 82 93 82    No results found for this or any previous visit (from the past 240 hour(s)).       Studies: Dg Abd 2 Views  01/13/2015  CLINICAL DATA:  Follow-up small bowel obstruction EXAM: ABDOMEN - 2 VIEW COMPARISON:  01/11/2015 FINDINGS: There are distended small bowel loops in right abdomen and left upper abdomen highly suspicious for bowel obstruction. Paucity of bowel gas in left lower abdomen and pelvis. IMPRESSION: Gaseous distended small bowel loops in right abdomen and upper abdomen highly suspicious for bowel obstruction. Paucity of bowel gas in left abdomen and pelvis Electronically Signed   By: Natasha Mead M.D.   On: 01/13/2015 12:39   Dg Abd Portable 1v-small Bowel Obstruction Protocol-initial, 8 Hr Delay  01/11/2015  CLINICAL DATA:  81 year old female with small bowel obstruction. Initial encounter. EXAM: PORTABLE ABDOMEN - 1 VIEW COMPARISON:  0831 hr today and earlier. FINDINGS: Portable AP supine view at 2228 hrs. Stable in NG tube looped in the stomach, side hole the level of the gastric body. Stable cholecystectomy clips. Staple lines re- identified in the left upper abdomen. Continued paucity of bowel gas. Still, mildly dilated up to 3-3.5 cm bowel small bowel loops are evident in the left abdomen. Paucity of colonic gas as before. Stable visualized osseous structures. IMPRESSION: 1. Stable NG tube. 2. Continued paucity of bowel gas, including colon gas. Residual mildly dilated (approximately 3 cm) small bowel loops evident in the left  abdomen. Electronically Signed   By: Odessa Fleming M.D.   On: 01/11/2015 23:06        Scheduled Meds: . amLODipine  10 mg Per Tube Daily  . aspirin  81 mg Per Tube Daily  . cloNIDine  0.2 mg Transdermal Weekly  . cycloSPORINE  1 drop Both Eyes BID  . enalaprilat  0.625 mg Intravenous 4 times per day  . fluticasone  2 spray Each Nare Daily  . heparin  5,000 Units Subcutaneous 3 times per day  . hydrALAZINE  10 mg Intravenous Q6H  . Influenza vac split quadrivalent PF  0.5 mL Intramuscular Tomorrow-1000  . latanoprost  1 drop Both Eyes QHS  . levothyroxine  112.5 mcg Intravenous QAC breakfast  . metoprolol  5 mg Intravenous 4 times per day  . mometasone-formoterol  2 puff Inhalation BID  . nitroGLYCERIN  0.4 mg Transdermal Daily  . pantoprazole (PROTONIX) IV  40 mg Intravenous Q12H  . polyvinyl alcohol  1 drop Both Eyes QID  . sertraline  100 mg Per Tube Daily   Continuous Infusions: . dextrose 5 % and 0.9 % NaCl with  KCl 20 mEq/L 50 mL/hr at 01/13/15 1407   Time spent: 25 min minutes.    Christiane Ha, MD [facility]  [email or web address] Password [email or web address] 01/13/2015, 4:08 PM    LOS: 3 days

## 2015-01-14 ENCOUNTER — Inpatient Hospital Stay (HOSPITAL_COMMUNITY): Payer: Medicare Other

## 2015-01-14 DIAGNOSIS — Z515 Encounter for palliative care: Secondary | ICD-10-CM

## 2015-01-14 DIAGNOSIS — E43 Unspecified severe protein-calorie malnutrition: Secondary | ICD-10-CM

## 2015-01-14 DIAGNOSIS — I2510 Atherosclerotic heart disease of native coronary artery without angina pectoris: Secondary | ICD-10-CM

## 2015-01-14 DIAGNOSIS — Z7189 Other specified counseling: Secondary | ICD-10-CM

## 2015-01-14 LAB — GLUCOSE, CAPILLARY
Glucose-Capillary: 121 mg/dL — ABNORMAL HIGH (ref 65–99)
Glucose-Capillary: 122 mg/dL — ABNORMAL HIGH (ref 65–99)
Glucose-Capillary: 128 mg/dL — ABNORMAL HIGH (ref 65–99)
Glucose-Capillary: 130 mg/dL — ABNORMAL HIGH (ref 65–99)
Glucose-Capillary: 133 mg/dL — ABNORMAL HIGH (ref 65–99)
Glucose-Capillary: 153 mg/dL — ABNORMAL HIGH (ref 65–99)
Glucose-Capillary: 73 mg/dL (ref 65–99)

## 2015-01-14 MED ORDER — ENALAPRILAT 1.25 MG/ML IV SOLN
1.2500 mg | Freq: Four times a day (QID) | INTRAVENOUS | Status: DC
  Administered 2015-01-14 – 2015-01-21 (×25): 1.25 mg via INTRAVENOUS
  Filled 2015-01-14 (×32): qty 1

## 2015-01-14 MED ORDER — NITROGLYCERIN 0.6 MG/HR TD PT24
0.6000 mg | MEDICATED_PATCH | Freq: Every day | TRANSDERMAL | Status: DC
  Administered 2015-01-15 – 2015-01-21 (×6): 0.6 mg via TRANSDERMAL
  Filled 2015-01-14 (×7): qty 1

## 2015-01-14 NOTE — Evaluation (Signed)
Physical Therapy Evaluation Patient Details Name: Shannon [name] MRN: [id number] DOB: 1923-05-22 Today's Date: 01/14/2015   History of Present Illness  82 y.o. female admitted with SBO.  Pt with h/o gastric outlet obstruction, SBO, sequela of a perforated pyloric / duodenal ulcer and SIADH (syndrome of inappropriate ADH production)   Clinical Impression  Patient did well with mobility today.  Limited in session due to pain in bilateral feet - patient did not want to attempt gait.   Patient min-guard for sit to/from stand and no loss of balance during standing activities.  Patient will benefit from continued PT (depending on her goals of care; currently meeting with palliative care) to continue to progress mobility and increase independence for return home.    Follow Up Recommendations Home health PT    Equipment Recommendations  None recommended by PT    Recommendations for Other Services       Precautions / Restrictions Precautions Precautions: Fall      Mobility  Bed Mobility               General bed mobility comments: in recliner upon arrival  Transfers Overall transfer level: Needs assistance Equipment used: Rolling walker (2 wheeled) Transfers: Sit to/from Stand Sit to Stand: Min guard         General transfer comment: verbal cues for hand placement and sequence  Ambulation/Gait Ambulation/Gait assistance:  (did not attempt)              Stairs            Wheelchair Mobility    Modified Rankin (Stroke Patients Only)       Balance Overall balance assessment: Needs assistance         Standing balance support: Bilateral upper extremity supported Standing balance-Leahy Scale: Poor Standing balance comment: requires RW for balance                             Pertinent Vitals/Pain Pain Assessment: 0-10 Pain Score: 3  Pain Location: bilateral feet Pain Descriptors / Indicators: Aching;Burning;Constant Pain Intervention(s):  Limited activity within patient's tolerance;Monitored during session    Home Living Family/patient expects to be discharged to:: Private residence Living Arrangements: Children Available Help at Discharge: Family;Available 24 hours/day Type of Home: House Home Access: Ramped entrance     Home Layout: One level Home Equipment: Walker - 2 wheels;Walker - 4 wheels;Cane - single point;Shower seat      Prior Function Level of Independence: Independent with assistive device(s)         Comments: uses RW     Hand Dominance        Extremity/Trunk Assessment   Upper Extremity Assessment: Generalized weakness           Lower Extremity Assessment: Generalized weakness      Cervical / Trunk Assessment: Normal  Communication   Communication: No difficulties  Cognition Arousal/Alertness: Awake/alert Behavior During Therapy: WFL for tasks assessed/performed Overall Cognitive Status: Within Functional Limits for tasks assessed                      General Comments      Exercises General Exercises - Lower Extremity Ankle Circles/Pumps: AROM;Both;10 reps;Seated Long Arc Quad: AROM;Both;10 reps;Seated Hip Flexion/Marching: AROM;Both;10 reps;Standing      Assessment/Plan    PT Assessment Patient needs continued PT services  PT Diagnosis Generalized weakness   PT Problem List Decreased strength;Decreased activity tolerance;Decreased balance;Decreased  mobility  PT Treatment Interventions DME instruction;Gait training;Functional mobility training;Therapeutic activities;Therapeutic exercise;Patient/family education;Balance training   PT Goals (Current goals can be found in the Care Plan section) Acute Rehab PT Goals Patient Stated Goal: feel better PT Goal Formulation: With patient Time For Goal Achievement: 01/28/15 Potential to Achieve Goals: Fair    Frequency Min 3X/week   Barriers to discharge        Co-evaluation               End of Session    Activity Tolerance: Patient tolerated treatment well Patient left: in chair;with call bell/phone within reach (palliative care in room)           Time: 6295-2841 PT Time Calculation (min) (ACUTE ONLY): 17 min   Charges:         PT G CodesOlivia [name] 01/14/2015, 11:12 AM  01/14/2015 Shannon [name], PT [phone]

## 2015-01-14 NOTE — Progress Notes (Signed)
Subjective:  No CP/SOB/Abd pain  Objective:  Temp:  [98.1 F (36.7 C)-98.2 F (36.8 C)] 98.2 F (36.8 C) (10/13 0620) Pulse Rate:  [74-88] 76 (10/13 0620) Resp:  [15-16] 15 (10/13 0620) BP: (161-234)/(68-94) 184/70 mmHg (10/13 0620) SpO2:  [96 %-99 %] 99 % (10/13 0746) Weight change:   Intake/Output from previous day: 10/12 0701 - 10/13 0700 In: 1083.2 [P.O.:290; I.V.:793.2] Out: 500 [Emesis/NG output:500]  Intake/Output from this shift:    Physical Exam: General appearance: alert and no distress Neck: no adenopathy, no carotid bruit, no JVD, supple, symmetrical, trachea midline and thyroid not enlarged, symmetric, no tenderness/mass/nodules Lungs: clear to auscultation bilaterally Heart: regular rate and rhythm, S1, S2 normal, no murmur, click, rub or gallop Abdomen: Non tender, scattered BS Extremities: extremities normal, atraumatic, no cyanosis or edema  Lab Results: Results for orders placed or performed during the hospital encounter of 01/10/15 (from the past 48 hour(s))  Glucose, capillary     Status: None   Collection Time: 01/12/15 11:44 AM  Result Value Ref Range   Glucose-Capillary 75 65 - 99 mg/dL   Comment 1 Notify RN   Glucose, capillary     Status: Abnormal   Collection Time: 01/12/15 12:39 PM  Result Value Ref Range   Glucose-Capillary 126 (H) 65 - 99 mg/dL   Comment 1 Notify RN   Glucose, capillary     Status: None   Collection Time: 01/12/15  3:55 PM  Result Value Ref Range   Glucose-Capillary 88 65 - 99 mg/dL   Comment 1 Notify RN   Troponin I (q 6hr x 3)     Status: Abnormal   Collection Time: 01/12/15  3:58 PM  Result Value Ref Range   Troponin I 0.42 (H) <0.031 ng/mL    Comment:        PERSISTENTLY INCREASED TROPONIN VALUES IN THE RANGE OF 0.04-0.49 ng/mL CAN BE SEEN IN:       -UNSTABLE ANGINA       -CONGESTIVE HEART FAILURE       -MYOCARDITIS       -CHEST TRAUMA       -ARRYHTHMIAS       -LATE PRESENTING MYOCARDIAL  INFARCTION       -COPD   CLINICAL FOLLOW-UP RECOMMENDED.   Glucose, capillary     Status: None   Collection Time: 01/12/15  8:28 PM  Result Value Ref Range   Glucose-Capillary 83 65 - 99 mg/dL   Comment 1 Notify RN    Comment 2 Document in Chart   Troponin I (q 6hr x 3)     Status: Abnormal   Collection Time: 01/12/15  9:02 PM  Result Value Ref Range   Troponin I 0.35 (H) <0.031 ng/mL    Comment:        PERSISTENTLY INCREASED TROPONIN VALUES IN THE RANGE OF 0.04-0.49 ng/mL CAN BE SEEN IN:       -UNSTABLE ANGINA       -CONGESTIVE HEART FAILURE       -MYOCARDITIS       -CHEST TRAUMA       -ARRYHTHMIAS       -LATE PRESENTING MYOCARDIAL INFARCTION       -COPD   CLINICAL FOLLOW-UP RECOMMENDED.   Glucose, capillary     Status: None   Collection Time: 01/12/15 11:51 PM  Result Value Ref Range   Glucose-Capillary 82 65 - 99 mg/dL   Comment 1 Notify RN    Comment 2  Document in Chart   Troponin I (q 6hr x 3)     Status: Abnormal   Collection Time: 01/13/15  2:32 AM  Result Value Ref Range   Troponin I 0.24 (H) <0.031 ng/mL    Comment:        PERSISTENTLY INCREASED TROPONIN VALUES IN THE RANGE OF 0.04-0.49 ng/mL CAN BE SEEN IN:       -UNSTABLE ANGINA       -CONGESTIVE HEART FAILURE       -MYOCARDITIS       -CHEST TRAUMA       -ARRYHTHMIAS       -LATE PRESENTING MYOCARDIAL INFARCTION       -COPD   CLINICAL FOLLOW-UP RECOMMENDED.   Basic metabolic panel     Status: Abnormal   Collection Time: 01/13/15  2:32 AM  Result Value Ref Range   Sodium 139 135 - 145 mmol/L   Potassium 4.1 3.5 - 5.1 mmol/L   Chloride 107 101 - 111 mmol/L   CO2 19 (L) 22 - 32 mmol/L   Glucose, Bld 91 65 - 99 mg/dL   BUN 16 6 - 20 mg/dL   Creatinine, Ser 0.89 0.44 - 1.00 mg/dL   Calcium 10.0 8.9 - 10.3 mg/dL   GFR calc non Af Amer 56 (L) >60 mL/min   GFR calc Af Amer >60 >60 mL/min    Comment: (NOTE) The eGFR has been calculated using the CKD EPI equation. This calculation has not been  validated in all clinical situations. eGFR's persistently <60 mL/min signify possible Chronic Kidney Disease.    Anion gap 13 5 - 15  Glucose, capillary     Status: None   Collection Time: 01/13/15  4:24 AM  Result Value Ref Range   Glucose-Capillary 93 65 - 99 mg/dL   Comment 1 Notify RN    Comment 2 Document in Chart   Glucose, capillary     Status: None   Collection Time: 01/13/15  7:24 AM  Result Value Ref Range   Glucose-Capillary 82 65 - 99 mg/dL  Glucose, capillary     Status: None   Collection Time: 01/13/15  4:38 PM  Result Value Ref Range   Glucose-Capillary 97 65 - 99 mg/dL  Glucose, capillary     Status: Abnormal   Collection Time: 01/13/15  8:38 PM  Result Value Ref Range   Glucose-Capillary 109 (H) 65 - 99 mg/dL   Comment 1 Notify RN    Comment 2 Document in Chart   Glucose, capillary     Status: Abnormal   Collection Time: 01/14/15 12:01 AM  Result Value Ref Range   Glucose-Capillary 153 (H) 65 - 99 mg/dL   Comment 1 Notify RN    Comment 2 Document in Chart   Glucose, capillary     Status: Abnormal   Collection Time: 01/14/15  4:05 AM  Result Value Ref Range   Glucose-Capillary 128 (H) 65 - 99 mg/dL   Comment 1 Notify RN    Comment 2 Document in Chart   Glucose, capillary     Status: Abnormal   Collection Time: 01/14/15  7:36 AM  Result Value Ref Range   Glucose-Capillary 122 (H) 65 - 99 mg/dL   Comment 1 Notify RN     Imaging: Imaging results have been reviewed  Tele- NSR  Assessment/Plan:   1. Principal Problem: 2.   SBO (small bowel obstruction) ([place]) 3. Active Problems: 4.   AP (abdominal pain) 5.   SIADH (syndrome  of inappropriate ADH production) ([place]) 6.   Protein-calorie malnutrition, severe 7.   Elevated troponin 8.   HTN (hypertension), uncontrolled 9.   Time Spent Directly with Patient:  20 minutes  Length of Stay:  LOS: 4 days   SBO, NGT in place. Has been NPO for 4 days. BP still high. Will continue IV BP meds since doubt  adequate GI absorption (Hydralazine, Metop and vasotec). No further w/u of mildly elevated trop. No CP/SOB. Agree with Dr. Conley Canal regarding Palliative Care consult if SBO doesn't resolve. Pt does not wish to have surgery. Otherwise need to address nutrition. No further recs. Will S/O. Call if we can be of further assistance.   Quay Burow 01/14/2015, 8:17 AM

## 2015-01-14 NOTE — Progress Notes (Signed)
Patient ID: Shannon [name], female   DOB: [birth year], 81 y.o.   MRN: [id number]    Subjective: Patient feels better again today.  No flatus or BM yet though.  Objective: Vital signs in last 24 hours: Temp:  [98.1 F (36.7 C)-98.2 F (36.8 C)] 98.2 F (36.8 C) (10/13 0620) Pulse Rate:  [74-88] 76 (10/13 0620) Resp:  [15-16] 15 (10/13 0620) BP: (161-234)/(68-94) 184/70 mmHg (10/13 0620) SpO2:  [96 %-99 %] 99 % (10/13 0746) Last BM Date: 01/07/15  Intake/Output from previous day: 10/12 0701 - 10/13 0700 In: 1083.2 [P.O.:290; I.V.:793.2] Out: 500 [Emesis/NG output:500] Intake/Output this shift:    PE: Abd: soft, NT today, ND, still very hypoactive to absent BS, NGT output is watered down quite a bit, so suspect output is decreased Heart: regular Lungs: CTAB  Lab Results:   Recent Labs  01/12/15 0425  WBC 4.7  HGB 12.0  HCT 34.4*  PLT 230   BMET  Recent Labs  01/12/15 0425 01/13/15 0232  NA 138 139  K 3.8 4.1  CL 106 107  CO2 20* 19*  GLUCOSE 88 91  BUN 15 16  CREATININE 0.86 0.89  CALCIUM 10.0 10.0   PT/INR No results for input(s): LABPROT, INR in the last 72 hours. CMP     Component Value Date/Time   NA 139 01/13/2015 0232   NA 137 11/02/2014 1029   K 4.1 01/13/2015 0232   CL 107 01/13/2015 0232   CO2 19* 01/13/2015 0232   GLUCOSE 91 01/13/2015 0232   GLUCOSE 94 11/02/2014 1029   BUN 16 01/13/2015 0232   BUN 31* 11/02/2014 1029   CREATININE 0.89 01/13/2015 0232   CREATININE 1.3* 05/06/2014   CALCIUM 10.0 01/13/2015 0232   PROT 7.6 01/10/2015 0131   PROT 6.8 11/02/2014 1029   ALBUMIN 4.0 01/10/2015 0131   ALBUMIN 4.1 11/02/2014 1029   AST 29 01/10/2015 0131   ALT 19 01/10/2015 0131   ALKPHOS 75 01/10/2015 0131   BILITOT 1.0 01/10/2015 0131   BILITOT 0.3 11/02/2014 1029   GFRNONAA 56* 01/13/2015 0232   GFRAA >60 01/13/2015 0232   Lipase     Component Value Date/Time   LIPASE 21* 01/10/2015 0131       Studies/Results: Dg Abd 2  Views  01/13/2015  CLINICAL DATA:  Follow-up small bowel obstruction EXAM: ABDOMEN - 2 VIEW COMPARISON:  01/11/2015 FINDINGS: There are distended small bowel loops in right abdomen and left upper abdomen highly suspicious for bowel obstruction. Paucity of bowel gas in left lower abdomen and pelvis. IMPRESSION: Gaseous distended small bowel loops in right abdomen and upper abdomen highly suspicious for bowel obstruction. Paucity of bowel gas in left abdomen and pelvis Electronically Signed   By: Natasha Mead M.D.   On: 01/13/2015 12:39    Anti-infectives: Anti-infectives    Start     Dose/Rate Route Frequency Ordered Stop   01/10/15 2200  amoxicillin (AMOXIL) 250 MG/5ML suspension 250 mg  Status:  Discontinued     250 mg Oral Every 12 hours 01/10/15 1703 01/10/15 1704   01/10/15 2200  amoxicillin (AMOXIL) 250 MG/5ML suspension 250 mg  Status:  Discontinued     250 mg Per Tube Every 12 hours 01/10/15 1704 01/10/15 1725   01/10/15 1730  amoxicillin (AMOXIL) 250 MG/5ML suspension 500 mg  Status:  Discontinued     500 mg Per Tube 3 times per day 01/10/15 1725 01/13/15 1333       Assessment/Plan  SBO -patient looks  better overall today, but still with no bowel function.  NGT output seems less, in that it is watered down and not frankly bilious.  Will repeat films tomorrow am, but cont current management today -mobilize as able,  PT eval pending -patient still says she doesn't want an operation if her obstruction were to fail to resolve conservatively. Palliative care consult pending in case her obstruction doesn't resolve VTE prophylaxis-SCD/heparin  FEN-NPO, IVF   LOS: 4 days    Shannon [name] 01/14/2015, 8:45 AM Pager: [phone]

## 2015-01-14 NOTE — Progress Notes (Signed)
Rechecked BP 190/74 Hydralize givne

## 2015-01-14 NOTE — Consult Note (Signed)
Consultation Note Date: 01/14/2015   Patient Name: Shannon [name]  DOB: 04-02-1924  MRN: [id number]  Age / Sex: 81 y.o., female   PCP: Shannon Boys, DO Referring Physician: Christiane Ha, MD  Reason for Consultation: Establishing goals of care and Psychosocial/spiritual support  Palliative Care Assessment and Plan Summary of Established Goals of Care and Medical Treatment Preferences   Clinical Assessment/Narrative: Shannon [name] is finishing with PT as I enter.  She is alert and pleasant.  We talk about her understanding of her health problems.  She tells me about having teeth pulled and not taking her miralax, and subsequently no BM since Thursday ( 1 week).  We talk about her surgical consults.  Shannon [name] tells me that she does not want to have surgery again, even if this means she will pass.  She tells me that her daughter with whom she lives is in agreement with her. She tells me that her goal is go to Shannon [name]'s home.  She tells me that she does not have any worries, "In God's hands" and she takes one day at a time.  Also what is most important to her is "just being".  Shannon [name] talks about the death of her husband with cancer. He died under Shannon [name] care in Shannon [name]'s home in Aug. 2015.  She tells me how they called Shannon [name] d/t breathing concerns, but they didn't arrive until after he had passed.   I share my concern about her symptom management at home.  We talk about Shannon [name] vs Shannon [name] services and the importance of talking about this with her daughter.  Daughter Shannon [name] enters as I am completing charting, and we talk about her mothers illness with her mothers permission.  Shannon [name] tells me that she will follow her mothers wishes and accepts that her mother does not want to have surgery and is too frail.  We talk about Mrs. Smiths stated desire to not return to the [name], but Shannon [name] states if her mother can be helped in the [name]-then that is what she will do (come to the [name]).  We talk  about Shannon [name]'s goals/worries and what is most important to her at this time, and how Shannon [name] has preference to die at home. I share my concern that coming back to the [name], she may not be able to leave.  We discuss the MOST form and the choices they have for care.  Shannon [name] does share that she feels her father was pain free with Shannon [name] at his death.   I share my worry that the NG tube is not improving her obstruction, and outside surgery, there are no other interventions.   Shannon [name] tells me that she has a family member in medicine and wants to know why [name] is not on Reglan and that she wants to talk to a Doctor. Shannon [name] also states that she doesn't want palliative or Shannon [name] coming to their home. She goes on to tell me that she and her mother will make choices together, but during this time [name] is looking down at her cup of ice chips, occasionally cutting her eyes to Shannon [name], but not responding unless directly asked by me.  .    Contacts/Participants in Discussion: Primary Decision Maker: Shannon [name] is able to make her own decisions, but leans on her daughter Shannon [name].  HCPOA: yes  States all children, Shannon [name], Shannon [name], and Shannon [name].   Code Status/Advance Care Planning:  DNR today  MOST form - Do Not  Re hospitalize  Unsure about IV fluids and antibiotics.   Symptom Management:   Morphine 2-4 mg IV Q 4 hours PRN  Palliative Prophylaxis: NG to intermittent sx.   Psycho-social/Spiritual:   Support System: Lives with daughter Shannon [name] who has a disabled child.  Husband died in Dec 05, 2013.  Two sons, one in [place].   Desire for further Chaplaincy support:Not discussed today.   Prognosis: < 6 months, 2 weeks likely without interventions  Discharge Planning:  Home, unsure of services at this time. HH vs Shannon [name].        Chief Complaint:   N/V History of Present Illness: Shannon [name] is a 81 y.o. female with h/o gastric outlet obstruction, SBO, sequela of a  perforated pyloric / duodenal ulcer that she had during summer 2015. Patient presents to the ED with c/o gradually worsening, generalized abdominal pain. Symptoms onset 1 day ago. No BM since Thursday at home and unable to pass gas either. She had teeth pulled on Wed this week, pain control was with Tylenol 3 but she stopped this after Wed.  She has NG to intermittent sx. But this has not been effective for resolving her issues.  She has had a surgical consult, but declines to have surgery.   Primary Diagnoses  Present on Admission:  . AP (abdominal pain) . SBO (small bowel obstruction) (HCC) . SIADH (syndrome of inappropriate ADH production) (HCC) . HTN (hypertension), uncontrolled  Palliative Review of Systems: [name] denies uncontrolled pain, nausea, dyspnea, or anxiety.  I have reviewed the medical record, interviewed the patient and family, and examined the patient. The following aspects are pertinent.  Past Medical History  Diagnosis Date  . Pneumonia   . Hyperlipidemia   . Hypertension   . Small bowel obstruction (HCC)     "I don't know how many" (01/11/2015)  . Ventral hernia with bowel obstruction   . Asthma   . Hyperlipidemia   . Stroke (HCC)     "light one"  . COPD (chronic obstructive pulmonary disease) (HCC)   . Hypothyroidism   . Type II diabetes mellitus (HCC)   . Anemia   . History of blood transfusion   . GERD (gastroesophageal reflux disease)   . History of stomach ulcers   . Arthritis     "all over"  . Renal insufficiency   . Depression     "light case"  . SIADH (syndrome of inappropriate ADH production) (HCC)     [name] 01/10/2015   Social History   Social History  . Marital Status: Widowed    Spouse Name: N/A  . Number of Children: N/A  . Years of Education: N/A   Social History Main Topics  . Smoking status: Never Smoker   . Smokeless tobacco: Former Neurosurgeon    Types: Snuff  . Alcohol Use: No  . Drug Use: No  . Sexual Activity: No    Other Topics Concern  . None   Social History Narrative   Married in [place], lives in one story house with 2 people and no pets   Occupation: ?   Has a living Will, [place], and doesn't have DNR   Was living with Husband (in frail health) in [place] until 09/2013.  After her [name] admission they both came to live with daughter in [place]   Family History  Problem Relation Age of Onset  . Stroke Mother   . Heart attack Neg Hx   . Hypertension Mother   . Diabetes Brother  Scheduled Meds: . amLODipine  10 mg Per Tube Daily  . aspirin  81 mg Per Tube Daily  . cloNIDine  0.2 mg Transdermal Weekly  . cycloSPORINE  1 drop Both Eyes BID  . enalaprilat  0.625 mg Intravenous 4 times per day  . fluticasone  2 spray Each Nare Daily  . heparin  5,000 Units Subcutaneous 3 times per day  . hydrALAZINE  10 mg Intravenous Q6H  . Influenza vac split quadrivalent PF  0.5 mL Intramuscular Tomorrow-1000  . latanoprost  1 drop Both Eyes QHS  . levothyroxine  112.5 mcg Intravenous QAC breakfast  . metoprolol  5 mg Intravenous 4 times per day  . mometasone-formoterol  2 puff Inhalation BID  . nitroGLYCERIN  0.4 mg Transdermal Daily  . pantoprazole (PROTONIX) IV  40 mg Intravenous Q12H  . polyvinyl alcohol  1 drop Both Eyes QID  . sertraline  100 mg Per Tube Daily   Continuous Infusions: . dextrose 5 % and 0.9 % NaCl with KCl 20 mEq/L 50 mL/hr at 01/14/15 0847   PRN Meds:.acetaminophen (TYLENOL) oral liquid 160 mg/5 mL, albuterol, dextrose, hydrALAZINE, iohexol, morphine injection, nitroGLYCERIN, sodium chloride Medications Prior to Admission:  Prior to Admission medications   Medication Sig Start Date End Date Taking? Authorizing Provider  acetaminophen (TYLENOL) 325 MG tablet Take 650 mg by mouth every 6 (six) hours as needed for mild pain or fever.   Yes Historical Provider, MD  ADVAIR DISKUS 250-50 MCG/DOSE AEPB INHALE 1 PUFF INTO THE LUNGS 2 (TWO) TIMES DAILY AS NEEDED FOR SHORTNESS OF  BREATH 12/01/14  Yes Historical Provider, MD  albuterol (PROVENTIL HFA;VENTOLIN HFA) 108 (90 BASE) MCG/ACT inhaler Inhale 2 puffs into the lungs every 6 (six) hours as needed for wheezing or shortness of breath. 04/08/14  Yes Lars Masson, MD  amLODipine (NORVASC) 10 MG tablet Take 1 tablet (10 mg total) by mouth daily. For HTN 11/04/14  Yes Shannon Boys, DO  aspirin EC 81 MG tablet Take 81 mg by mouth every morning.   Yes Historical Provider, MD  atorvastatin (LIPITOR) 10 MG tablet TAKE 1 TABLET BY MOUTH EVERY DAY 09/11/14  Yes Shannon Boys, DO  b complex vitamins tablet Take 1 tablet by mouth 2 (two) times daily.   Yes Historical Provider, MD  calcium-vitamin D (OSCAL WITH D) 500-200 MG-UNIT per tablet Take 1 tablet by mouth daily with breakfast.    Yes Historical Provider, MD  chlorhexidine (PERIDEX) 0.12 % solution 15 mLs by Mouth Rinse route 2 (two) times daily. 03/26/14  Yes Alison Murray, MD  cloNIDine (CATAPRES) 0.1 MG tablet Take 1 tablet (0.1 mg total) by mouth 2 (two) times daily. 07/13/14  Yes Lars Masson, MD  feeding supplement, GLUCERNA SHAKE, (GLUCERNA SHAKE) LIQD Take 237 mLs by mouth 3 (three) times daily between meals. 05/30/14  Yes Simbiso Ranga, MD  fluticasone (FLONASE) 50 MCG/ACT nasal spray Place 2 sprays into both nostrils daily. 08/07/14  Yes Shannon Boys, DO  gabapentin (NEURONTIN) 300 MG capsule Take 1 capsule (300 mg total) by mouth 3 (three) times daily. 09/23/14  Yes Shannon Boys, DO  hydrALAZINE (APRESOLINE) 100 MG tablet TAKE 1 TABLET BY MOUTH 3 TIMES A DAY 06/22/14  Yes Shannon Boys, DO  isosorbide mononitrate (IMDUR) 30 MG 24 hr tablet Take one tablet by mouth once daily. If Chest Pains occur increase to two tablets daily. 07/22/14  Yes Monica Carter, DO  L-Methylfolate-Algae-B12-B6 (METANX) 3-90.314-2-35 MG CAPS TAKE 1 CAPSULE BY MOUTH ONCE  DAILY 01/01/15  Yes Shannon Boys, DO  lactulose (CHRONULAC) 10 GM/15ML solution Take 15 mLs (10 g total) by mouth daily as  needed for mild constipation. 06/02/14  Yes Mahima Pandey, MD  latanoprost (XALATAN) 0.005 % ophthalmic solution Place 1 drop into both eyes at bedtime. 06/02/14  Yes Mahima Pandey, MD  levothyroxine (SYNTHROID, LEVOTHROID) 100 MCG tablet Take 1 tablet (100 mcg total) by mouth daily. Take with dose (total ) 11/06/14  Yes Shannon Boys, DO  levothyroxine (SYNTHROID, LEVOTHROID) 125 MCG tablet Take 1 tablet (125 mcg total) by mouth daily before breakfast. Take with dose (total ) 11/06/14  Yes Shannon Boys, DO  metoprolol succinate (TOPROL-XL) 25 MG 24 hr tablet Take 1 tablet (25 mg total) by mouth daily. 09/23/14  Yes Shannon Boys, DO  Multiple Vitamins-Minerals (MULTIVITAMIN WITH MINERALS) tablet Take 1 tablet by mouth every morning.    Yes Historical Provider, MD  pantoprazole (PROTONIX) 40 MG tablet TAKE 1 TABLET BY MOUTH TWICE A DAY 08/07/14  Yes Shannon Boys, DO  Polyethyl Glycol-Propyl Glycol 0.4-0.3 % SOLN Place 1 drop into both eyes 4 (four) times daily.    Yes Historical Provider, MD  polyethylene glycol (MIRALAX / GLYCOLAX) packet Take 17 g by mouth 2 (two) times daily.   Yes Historical Provider, MD  RESTASIS 0.05 % ophthalmic emulsion USE 1 DROP INTO BOTH EYES TWICE DAILY 10/19/14  Yes Shannon Boys, DO  sennosides-docusate sodium (SENOKOT-S) 8.6-50 MG tablet Take 1 tablet by mouth 2 (two) times daily.    Yes Historical Provider, MD  sertraline (ZOLOFT) 100 MG tablet Take one tablet by mouth once daily for depression 11/16/14  Yes Shannon Boys, DO  sodium chloride 1 G tablet TAKE 1 TABLET BY MOUTH THREE TIMES DAILY 11/04/14  Yes Shannon Boys, DO  traMADol (ULTRAM) 50 MG tablet Take by mouth 2 (two) times daily as needed for moderate pain.   Yes Historical Provider, MD   No Known Allergies CBC:    Component Value Date/Time   WBC 4.7 01/12/2015 0425   WBC 5.3 06/02/2014 1629   HGB 12.0 01/12/2015 0425   HCT 34.4* 01/12/2015 0425   PLT 230 01/12/2015 0425   MCV 92.2  01/12/2015 0425   NEUTROABS 7.3 01/10/2015 0131   NEUTROABS 3.2 06/02/2014 1629   LYMPHSABS 0.5* 01/10/2015 0131   LYMPHSABS 1.2 06/02/2014 1629   MONOABS 0.5 01/10/2015 0131   EOSABS 0.0 01/10/2015 0131   EOSABS 0.2 06/02/2014 1629   BASOSABS 0.0 01/10/2015 0131   BASOSABS 0.1 06/02/2014 1629   Comprehensive Metabolic Panel:    Component Value Date/Time   NA 139 01/13/2015 0232   NA 137 11/02/2014 1029   K 4.1 01/13/2015 0232   CL 107 01/13/2015 0232   CO2 19* 01/13/2015 0232   BUN 16 01/13/2015 0232   BUN 31* 11/02/2014 1029   CREATININE 0.89 01/13/2015 0232   CREATININE 1.3* 05/06/2014   GLUCOSE 91 01/13/2015 0232   GLUCOSE 94 11/02/2014 1029   CALCIUM 10.0 01/13/2015 0232   AST 29 01/10/2015 0131   ALT 19 01/10/2015 0131   ALKPHOS 75 01/10/2015 0131   BILITOT 1.0 01/10/2015 0131   BILITOT 0.3 11/02/2014 1029   PROT 7.6 01/10/2015 0131   PROT 6.8 11/02/2014 1029   ALBUMIN 4.0 01/10/2015 0131   ALBUMIN 4.1 11/02/2014 1029    Physical Exam: Vital Signs: BP 190/74 mmHg  Pulse 76  Temp(Src) 98.2 F (36.8 C) (Oral)  Resp 15  Ht 5\' 6"  (1.676 m)  Wt 57.97 kg (127 lb 12.8 oz)  BMI 20.64 kg/m2  SpO2 99% SpO2: SpO2: 99 % O2 Device: O2 Device: Not Delivered O2 Flow Rate:   Intake/output summary:  Intake/Output Summary (Last 24 hours) at 01/14/15 1154 Last data filed at 01/14/15 1026  Gross per 24 hour  Intake 983.17 ml  Output    700 ml  Net 283.17 ml   LBM: Last BM Date: 01/07/15 Baseline Weight: Weight: 57.97 kg (127 lb 12.8 oz) Most recent weight: Weight: 57.97 kg (127 lb 12.8 oz)  Exam Findings:  Constitutional:  Elderly frail, sitting in recliner. Makes and keeps eye contact.  Resp:  Even and non labored Cardio:  No swelling, edema Abd:  Soft rounded, non tender, no masses          Palliative Performance Scale: 40% EXCEPT NPO for 4 days.               Additional Data Reviewed: Recent Labs     01/12/15  0425  01/13/15  0232  WBC  4.7   --   HGB   12.0   --   PLT  230   --   NA  138  139  BUN  15  16  CREATININE  0.86  0.89     Time In: 1045 Time Out: 1215 Time Total:  90 mintues Greater than 50%  of this time was spent counseling and coordinating care related to the above assessment and plan.  Signed by: Katheran Awe, NP  Katheran Awe, NP  01/14/2015, 11:54 AM  Please contact Palliative Medicine Team phone at [phone] for questions and concerns.

## 2015-01-14 NOTE — Progress Notes (Signed)
  Echocardiogram 2D Echocardiogram has been performed.  Donata Clay 01/14/2015, 3:21 PM

## 2015-01-14 NOTE — Progress Notes (Signed)
PROGRESS NOTE    Shannon [name] ZOX:[id number] DOB: 02/06/1924 DOA: 01/10/2015 PCP: Kirt Boys, DO  HPI/Brief narrative 81 y.o. female with history of remote gastrojejunostomy & suspected vagotomy, status post GJ anastomosis resection and GJ revision after perforation of anastomotic ulcer 09/2013, G00 01/2014 with EGD 2: #1 with no anatomic outlet obstruction and normal looking pylorus/duodenum, #2: To breakup bezoar, CT 03/12/14: Recurrent bezoar, thickened pyloric channel, EGD 03/19/14: Tortuous/narrowed anatomy at prepylorus/pylorus but no stricture or ulcer, UGI/SBFT 03/25/14: Normal, CT scan 05/21/14: Gastric distention, stomach filled with fluid/food and thickened pyloric wall, barium swallow 05/22/14: Small esophageal hernia, barium trickled into duodenum, eventually assessed to have functional gastric outlet obstruction (follows with [facility]) presented with 4 day history of progressively worsening abdominal pain. Last BM-small one on 10/8, no flatus since 4 days. In ED, CT abdomen suggestive of PSBO versus ileus. Multiple (3) attempts to place NG tube on floor unsuccessful. IR consulted for fluoroscopic guided NG tube placement. CCS consulted. She had teeth pulled on Wed this week, pain control was with Tylenol 3 but she stopped this after Wed..   Assessment/Plan:  SBO  No flatus, but less NGT output. Abdominal films tomorrow. Does not want surgery. Palliative consulted and patient made DNR. If SBP does not resolve, will have to revisit hospice with daughter. Patient reportedly open to the idea, but daughter was not, and patient deferred (per PMT)  Essential hypertension  Difficult to control. On multiple pgt meds. Doubt GI absorption is reliable. Increase nitrodur. Increase IV vasotec. Cardiology has ordered clonidine TTS. Daughter reports it "burned her skin" and refuses placement.  Type II DM No further lows  Hypothyroid - IV Synthroid  Dehydration with  hyponatremia resolved  Stage III chronic kidney disease - Creatinine is at baseline  History of CVA - Continue aspirin  Hypokalemia corrected  Anemia - stable  Elevated troponin Echo without WMA. EF normal. Cardiology signed off. On ASA, beta blocker  Tachycardia: no definite fib/flutter. Continue metoprolol  Severe malnutrition in context of chronic illness NPO for almost a week  DVT prophylaxis: Subcutaneous heparin  Code Status: Full Family Communication: daughter at bedside Disposition Plan:    Consultants:  Gen. Surgery   palliative  Procedures:    Antibiotics:  None    Subjective: Feeling better. No flatus. No CP. No n/v  Objective: Filed Vitals:   01/14/15 1203 01/14/15 1420 01/14/15 2200 01/14/15 2312  BP: 211/93 200/101 169/83 157/90  Pulse:  129 86 117  Temp:  97.8 F (36.6 C) 98.1 F (36.7 C)   TempSrc:  Oral Oral   Resp:  20 18   Height:      Weight:      SpO2:  100% 99%     Intake/Output Summary (Last 24 hours) at 01/14/15 2316 Last data filed at 01/14/15 1604  Gross per 24 hour  Intake    770 ml  Output    900 ml  Net   -130 ml   Filed Weights   01/12/15 1123  Weight: 57.97 kg (127 lb 12.8 oz)     Exam:  General exam: frail. Cooperative. Oriented. In chair HEENT: NGT draining yellow Respiratory system: CTA without WRR Cardiovascular system: RRR without MGR. Gastrointestinal system: abd. Soft, nontender. Diminished BS. Extremities: no CCE   Data Reviewed: Basic Metabolic Panel:  Recent Labs Lab 01/10/15 0131 01/11/15 0400 01/12/15 0425 01/13/15 0232  NA 133* 136 138 139  K 4.0 3.3* 3.8 4.1  CL 99* 102 106 107  CO2 21* 22 20* 19*  GLUCOSE 136* 113* 88 91  BUN 27* 13 15 16   CREATININE 1.12* 0.96 0.86 0.89  CALCIUM 10.3 9.5 10.0 10.0   Liver Function Tests:  Recent Labs Lab 01/10/15 0131  AST 29  ALT 19  ALKPHOS 75  BILITOT 1.0  PROT 7.6  ALBUMIN 4.0    Recent Labs Lab 01/10/15 0131  LIPASE  21*   No results for input(s): AMMONIA in the last 168 hours. CBC:  Recent Labs Lab 01/10/15 0131 01/11/15 0400 01/12/15 0425  WBC 8.3 5.9 4.7  NEUTROABS 7.3  --   --   HGB 12.3 11.5* 12.0  HCT 34.8* 33.1* 34.4*  MCV 91.1 92.5 92.2  PLT 220 212 230   Cardiac Enzymes:  Recent Labs Lab 01/12/15 1558 01/12/15 2102 01/13/15 0232  TROPONINI 0.42* 0.35* 0.24*   BNP (last 3 results)  Recent Labs  01/17/14 2116  PROBNP 961.6*   CBG:  Recent Labs Lab 01/14/15 0405 01/14/15 0736 01/14/15 1149 01/14/15 1703 01/14/15 2005  GLUCAP 128* 122* 133* 130* 121*    No results found for this or any previous visit (from the past 240 hour(s)).       Studies: Dg Abd 2 Views  01/13/2015  CLINICAL DATA:  Follow-up small bowel obstruction EXAM: ABDOMEN - 2 VIEW COMPARISON:  01/11/2015 FINDINGS: There are distended small bowel loops in right abdomen and left upper abdomen highly suspicious for bowel obstruction. Paucity of bowel gas in left lower abdomen and pelvis. IMPRESSION: Gaseous distended small bowel loops in right abdomen and upper abdomen highly suspicious for bowel obstruction. Paucity of bowel gas in left abdomen and pelvis Electronically Signed   By: Natasha Mead M.D.   On: 01/13/2015 12:39   Echo Left ventricle: The cavity size was normal. Systolic function was normal. The estimated ejection fraction was in the range of 55% to 60%. Wall motion was normal; there were no regional wall motion abnormalities. - Aortic valve: There was mild to moderate regurgitation. Regurgitation pressure half-time: 365 ms. - Left atrium: The atrium was severely dilated. - Right ventricle: The cavity size was normal. Wall thickness was normal. Systolic function was normal. - Tricuspid valve: There was mild regurgitation. - Pulmonary arteries: PA peak pressure: 42 mm Hg (S). - Inferior vena cava: The vessel was normal in size. The respirophasic diameter changes were in the  normal range (>= 50%), consistent with normal central venous pressure.     Scheduled Meds: . amLODipine  10 mg Per Tube Daily  . aspirin  81 mg Per Tube Daily  . cloNIDine  0.2 mg Transdermal Weekly  . cycloSPORINE  1 drop Both Eyes BID  . enalaprilat  1.25 mg Intravenous 4 times per day  . fluticasone  2 spray Each Nare Daily  . heparin  5,000 Units Subcutaneous 3 times per day  . hydrALAZINE  10 mg Intravenous Q6H  . Influenza vac split quadrivalent PF  0.5 mL Intramuscular Tomorrow-1000  . latanoprost  1 drop Both Eyes QHS  . levothyroxine  112.5 mcg Intravenous QAC breakfast  . metoprolol  5 mg Intravenous 4 times per day  . mometasone-formoterol  2 puff Inhalation BID  . [START ON 01/15/2015] nitroGLYCERIN  0.6 mg Transdermal Daily  . pantoprazole (PROTONIX) IV  40 mg Intravenous Q12H  . polyvinyl alcohol  1 drop Both Eyes QID  . sertraline  100 mg Per Tube Daily   Continuous Infusions: . dextrose 5 % and 0.9 % NaCl  with KCl 20 mEq/L 50 mL/hr at 01/14/15 0847   Time spent: 25 min minutes.    Christiane Ha, MD [facility]  [email or web address] Password [email or web address] 01/14/2015, 11:16 PM    LOS: 4 days

## 2015-01-15 ENCOUNTER — Inpatient Hospital Stay (HOSPITAL_COMMUNITY): Payer: Medicare Other

## 2015-01-15 LAB — GLUCOSE, CAPILLARY
Glucose-Capillary: 119 mg/dL — ABNORMAL HIGH (ref 65–99)
Glucose-Capillary: 120 mg/dL — ABNORMAL HIGH (ref 65–99)
Glucose-Capillary: 124 mg/dL — ABNORMAL HIGH (ref 65–99)
Glucose-Capillary: 139 mg/dL — ABNORMAL HIGH (ref 65–99)
Glucose-Capillary: 147 mg/dL — ABNORMAL HIGH (ref 65–99)
Glucose-Capillary: 152 mg/dL — ABNORMAL HIGH (ref 65–99)

## 2015-01-15 LAB — CBC
HCT: 34.1 % — ABNORMAL LOW (ref 36.0–46.0)
Hemoglobin: 12 g/dL (ref 12.0–15.0)
MCH: 33 pg (ref 26.0–34.0)
MCHC: 35.2 g/dL (ref 30.0–36.0)
MCV: 93.7 fL (ref 78.0–100.0)
Platelets: 280 10*3/uL (ref 150–400)
RBC: 3.64 MIL/uL — ABNORMAL LOW (ref 3.87–5.11)
RDW: 14.6 % (ref 11.5–15.5)
WBC: 6.3 10*3/uL (ref 4.0–10.5)

## 2015-01-15 LAB — BASIC METABOLIC PANEL
Anion gap: 10 (ref 5–15)
BUN: 19 mg/dL (ref 6–20)
CO2: 21 mmol/L — ABNORMAL LOW (ref 22–32)
Calcium: 9.9 mg/dL (ref 8.9–10.3)
Chloride: 114 mmol/L — ABNORMAL HIGH (ref 101–111)
Creatinine, Ser: 0.91 mg/dL (ref 0.44–1.00)
GFR calc Af Amer: >60 mL/min (ref >60)
GFR calc non Af Amer: 54 mL/min — ABNORMAL LOW (ref >60)
Glucose, Bld: 145 mg/dL — ABNORMAL HIGH (ref 65–99)
Potassium: 4.3 mmol/L (ref 3.5–5.1)
Sodium: 145 mmol/L (ref 135–145)

## 2015-01-15 NOTE — Progress Notes (Addendum)
PROGRESS NOTE    Kaleese Bruno AOZ:[id number] DOB: 07-Mar-1924 DOA: 01/10/2015 PCP: Kirt Boys, DO  HPI/Brief narrative 81 y.o. female with history of remote gastrojejunostomy & suspected vagotomy, status post GJ anastomosis resection and GJ revision after perforation of anastomotic ulcer 09/2013, G00 01/2014 with EGD 2: #1 with no anatomic outlet obstruction and normal looking pylorus/duodenum, #2: To breakup bezoar, CT 03/12/14: Recurrent bezoar, thickened pyloric channel, EGD 03/19/14: Tortuous/narrowed anatomy at prepylorus/pylorus but no stricture or ulcer, UGI/SBFT 03/25/14: Normal, CT scan 05/21/14: Gastric distention, stomach filled with fluid/food and thickened pyloric wall, barium swallow 05/22/14: Small esophageal hernia, barium trickled into duodenum, eventually assessed to have functional gastric outlet obstruction (follows with [facility]) presented with 4 day history of progressively worsening abdominal pain. Last BM-small one on 10/8, no flatus since 4 days. In ED, CT abdomen suggestive of PSBO versus ileus. Multiple (3) attempts to place NG tube on floor unsuccessful. IR consulted for fluoroscopic guided NG tube placement. CCS consulted. She had teeth pulled on Wed this week, pain control was with Tylenol 3 but she stopped this after Wed. patient was treated conservatively with bowel rest, NG tube and IV fluids despite which her SBO has not resolved. Patient has repeatedly declined surgery. Palliative care was consulted but family have asked them not to see patient and they signed off 10/14. As per surgery, if SBO not resolving at some point soon, there needs to be decision about feeding etc.   Assessment/Plan:  SBO  - Patient has been treated conservatively since admission including bowel rest, NG tube, IV fluids, serial clinical and x-ray exams. Despite these measures, SBO has not resolved. Patient has repeatedly declined surgery in the past. - Palliative care was consulted: made  DNR. If SBO does not resolve, will have to revisit hospice with daughter. Patient reportedly open to the idea, but daughter was not, and patient deferred (per PMT). However on 10/14, daughter was upset with the palliative care team and have asked them not to see patient again as per patient's wishes apparently - X-rays 10/14 show worsening SBO.  Essential hypertension  Difficult to control. On multiple pgt meds. Doubt GI absorption is reliable. Increased nitrodur. Increased IV vasotec. Cardiology has ordered clonidine TTS. Daughter reports it "burned her skin" and refuses placement. - Blood pressure control continues to be difficult and a reticulocyte.  Type II DM No further lows. Reasonable inpatient control.  Hypothyroid - IV Synthroid  Dehydration with hyponatremia resolved  Stage III chronic kidney disease - Creatinine is at baseline  History of CVA - Continue aspirin  Hypokalemia corrected  Anemia - stable  Elevated troponin Echo without WMA. EF normal. Cardiology signed off. On ASA, beta blocker  Tachycardia: no definite fib/flutter. Continue metoprolol  Severe malnutrition in context of chronic illness NPO for almost a week  DVT prophylaxis: Subcutaneous heparin  Code Status: DO NOT RESUSCITATE. Family Communication: discussed with daughter [name] 01/15/2015. Disposition Plan: Not medically stable for discharge   Consultants:  Gen. Surgery   Palliative-signed off 10/14  Cardiology-signed off  Procedures:  NG tube  2D Echo: Echo Left ventricle: The cavity size was normal. Systolic function was normal. The estimated ejection fraction was in the range of 55% to 60%. Wall motion was normal; there were no regional wall motion abnormalities. - Aortic valve: There was mild to moderate regurgitation. Regurgitation pressure half-time: 365 ms. - Left atrium: The atrium was severely dilated. - Right ventricle: The cavity size was  normal. Wall thickness  was normal. Systolic function was normal. - Tricuspid valve: There was mild regurgitation. - Pulmonary arteries: PA peak pressure: 42 mm Hg (S). - Inferior vena cava: The vessel was normal in size. The respirophasic diameter changes were in the normal range (>= 50%), consistent with normal central venous pressure.   Antibiotics:  None    Subjective: Denies abdominal pain. No flatus or BM since admission. No chest pain or dyspnea reported.  Objective: Filed Vitals:   01/15/15 0520 01/15/15 0549 01/15/15 0909 01/15/15 1339  BP: 203/105 153/66  204/108  Pulse: 89 61    Temp:  98 F (36.7 C)    TempSrc:  Axillary    Resp:  18    Height:      Weight:      SpO2:  100% 97%     Intake/Output Summary (Last 24 hours) at 01/15/15 1445 Last data filed at 01/15/15 1350  Gross per 24 hour  Intake    470 ml  Output   1200 ml  Net   -730 ml   Filed Weights   01/12/15 1123  Weight: 57.97 kg (127 lb 12.8 oz)     Exam:  General exam: Pleasant elderly frail female lying comfortably supine in bed Respiratory system: Clear to auscultation. No increased work of breathing. Cardiovascular system: S1 and S2 heard, RRR. No JVD or, murmurs or pedal edema Gastrointestinal system: Abdomen is nondistended, soft and nontender. Few bowel sounds heard. Extremities: Symmetric 5 x 5 power. CNS: Alert and oriented. No focal deficits.   Data Reviewed: Basic Metabolic Panel:  Recent Labs Lab 01/10/15 0131 01/11/15 0400 01/12/15 0425 01/13/15 0232 01/15/15 0400  NA 133* 136 138 139 145  K 4.0 3.3* 3.8 4.1 4.3  CL 99* 102 106 107 114*  CO2 21* 22 20* 19* 21*  GLUCOSE 136* 113* 88 91 145*  BUN 27* 13 15 16 19   CREATININE 1.12* 0.96 0.86 0.89 0.91  CALCIUM 10.3 9.5 10.0 10.0 9.9   Liver Function Tests:  Recent Labs Lab 01/10/15 0131  AST 29  ALT 19  ALKPHOS 75  BILITOT 1.0  PROT 7.6  ALBUMIN 4.0    Recent Labs Lab 01/10/15 0131  LIPASE 21*    No results for input(s): AMMONIA in the last 168 hours. CBC:  Recent Labs Lab 01/10/15 0131 01/11/15 0400 01/12/15 0425 01/15/15 0400  WBC 8.3 5.9 4.7 6.3  NEUTROABS 7.3  --   --   --   HGB 12.3 11.5* 12.0 12.0  HCT 34.8* 33.1* 34.4* 34.1*  MCV 91.1 92.5 92.2 93.7  PLT 220 212 230 280   Cardiac Enzymes:  Recent Labs Lab 01/12/15 1558 01/12/15 2102 01/13/15 0232  TROPONINI 0.42* 0.35* 0.24*   BNP (last 3 results)  Recent Labs  01/17/14 2116  PROBNP 961.6*   CBG:  Recent Labs Lab 01/14/15 2005 01/15/15 0015 01/15/15 0434 01/15/15 0719 01/15/15 1154  GLUCAP 121* 152* 139* 119* 120*    No results found for this or any previous visit (from the past 240 hour(s)).       Studies: Dg Abd 2 Views  01/15/2015  CLINICAL DATA:  81 year old female with abdominal pain and distention for 6 days. EXAM: ABDOMEN - 2 VIEW COMPARISON:  01/13/2015 FINDINGS: Increasing small bowel distention noted compatible with small bowel obstruction. There is no evidence of pneumoperitoneum. An NG tube with tip overlying the mid stomach is noted. Cholecystectomy clips are present. No acute bony abnormalities are identified. IMPRESSION: Increasing small bowel distention  compatible small bowel obstruction. No evidence of pneumoperitoneum. NG tube with tip overlying the mid stomach. Electronically Signed   By: Harmon Pier M.D.   On: 01/15/2015 13:04       Scheduled Meds: . amLODipine  10 mg Per Tube Daily  . aspirin  81 mg Per Tube Daily  . cloNIDine  0.2 mg Transdermal Weekly  . cycloSPORINE  1 drop Both Eyes BID  . enalaprilat  1.25 mg Intravenous 4 times per day  . fluticasone  2 spray Each Nare Daily  . heparin  5,000 Units Subcutaneous 3 times per day  . hydrALAZINE  10 mg Intravenous Q6H  . Influenza vac split quadrivalent PF  0.5 mL Intramuscular Tomorrow-1000  . latanoprost  1 drop Both Eyes QHS  . levothyroxine  112.5 mcg Intravenous QAC breakfast  . metoprolol  5 mg  Intravenous 4 times per day  . mometasone-formoterol  2 puff Inhalation BID  . nitroGLYCERIN  0.6 mg Transdermal Daily  . pantoprazole (PROTONIX) IV  40 mg Intravenous Q12H  . polyvinyl alcohol  1 drop Both Eyes QID  . sertraline  100 mg Per Tube Daily   Continuous Infusions: . dextrose 5 % and 0.9 % NaCl with KCl 20 mEq/L 50 mL/hr at 01/15/15 0400   Time spent: 25 min minutes.    Marcellus Scott, MD, FACP, FHM. [facility] Pager [phone]  If 7PM-7AM, please contact night-coverage [email or web address] Password [email or web address] 01/15/2015, 2:45 PM    LOS: 5 days

## 2015-01-15 NOTE — Progress Notes (Signed)
  Subjective: No flatus or bm no abdominal pain  Objective: Vital signs in last 24 hours: Temp:  [97.8 F (36.6 C)-98.1 F (36.7 C)] 98 F (36.7 C) (10/14 0549) Pulse Rate:  [61-129] 61 (10/14 0549) Resp:  [18-20] 18 (10/14 0549) BP: (153-211)/(66-105) 153/66 mmHg (10/14 0549) SpO2:  [99 %-100 %] 100 % (10/14 0549) Last BM Date: 01/07/15  Intake/Output from previous day: 10/13 0701 - 10/14 0700 In: 421 [I.V.:421] Out: 1200 [Urine:400; Emesis/NG output:800] Intake/Output this shift:    GI: healed scars no bs soft nontender  Lab Results:   Recent Labs  01/15/15 0400  WBC 6.3  HGB 12.0  HCT 34.1*  PLT 280   BMET  Recent Labs  02/04/15 0232 01/15/15 0400  NA 139 145  K 4.1 4.3  CL 107 114*  CO2 19* 21*  GLUCOSE 91 145*  BUN 16 19  CREATININE 0.89 0.91  CALCIUM 10.0 9.9   PT/INR No results for input(s): LABPROT, INR in the last 72 hours. ABG No results for input(s): PHART, HCO3 in the last 72 hours.  Invalid input(s): PCO2, PO2  Studies/Results: Dg Abd 2 Views  February 04, 2015  CLINICAL DATA:  Follow-up small bowel obstruction EXAM: ABDOMEN - 2 VIEW COMPARISON:  01/11/2015 FINDINGS: There are distended small bowel loops in right abdomen and left upper abdomen highly suspicious for bowel obstruction. Paucity of bowel gas in left lower abdomen and pelvis. IMPRESSION: Gaseous distended small bowel loops in right abdomen and upper abdomen highly suspicious for bowel obstruction. Paucity of bowel gas in left abdomen and pelvis Electronically Signed   By: Lahoma Crocker M.D.   On: 02/04/15 12:39    Anti-infectives: Anti-infectives    Start     Dose/Rate Route Frequency Ordered Stop   01/10/15 2200  amoxicillin (AMOXIL) 250 MG/5ML suspension 250 mg  Status:  Discontinued     250 mg Oral Every 12 hours 01/10/15 1703 01/10/15 1704   01/10/15 2200  amoxicillin (AMOXIL) 250 MG/5ML suspension 250 mg  Status:  Discontinued     250 mg Per Tube Every 12 hours 01/10/15 1704  01/10/15 1725   01/10/15 1730  amoxicillin (AMOXIL) 250 MG/5ML suspension 500 mg  Status:  Discontinued     500 mg Per Tube 3 times per day 01/10/15 1725 2015/02/04 1333      Assessment/Plan: SBO She is now 6 days in to an sbo. The chance of conservative resolution at this point is pretty minimal and this certainly would be a conversation about surgery now. She does not want to undergo any surgery and has stated this repeatedly.  We will check films today but if not resolving at some point soon there either needs to be decision about feeding etc   [facility] 01/15/2015

## 2015-01-15 NOTE — Progress Notes (Signed)
I received a call from the patient's daughter, [name], regarding visit by palliative medicine team.  She reports being upset that her mother was asked if she has a HCPOA by our team.    I told her that the purpose of asking if people have a HCPOA and asking if it is reflective of their current wishes is to ensure that her mother's autonomy is respected and preserved.  In exploring this with her, she reports that her mother is capable of making her own medical decisions.  I told her that her mother has the right to change her HCPOA as long as she can make her own decisions, and the reason that we ask is to ensure that her HCPOA is up-to-date with her current wishes on who speaks for her if she looses the ability to speak for herself.  I tried to reassure her that the intent was not to change her mother's HCPOA, but to make sure that it was reflective of her mother's wishes.  She told me that she understood this, but she also disagrees that this was appropriate to discuss with her mother.  She asked that no one from our team visit her mother.  She reports that this is per her mother's request.  I ensured that she had our contact information in case we can be of further assistance in the care of her mother in the future.  We will stop rounding on patient per patient and family request.  Micheline Rough, MD [facility] [phone]

## 2015-01-15 NOTE — Progress Notes (Signed)
Daughter is requesting no palliative doctors come in the room

## 2015-01-16 LAB — GLUCOSE, CAPILLARY
Glucose-Capillary: 112 mg/dL — ABNORMAL HIGH (ref 65–99)
Glucose-Capillary: 114 mg/dL — ABNORMAL HIGH (ref 65–99)
Glucose-Capillary: 115 mg/dL — ABNORMAL HIGH (ref 65–99)
Glucose-Capillary: 124 mg/dL — ABNORMAL HIGH (ref 65–99)
Glucose-Capillary: 124 mg/dL — ABNORMAL HIGH (ref 65–99)
Glucose-Capillary: 131 mg/dL — ABNORMAL HIGH (ref 65–99)
Glucose-Capillary: 135 mg/dL — ABNORMAL HIGH (ref 65–99)

## 2015-01-16 NOTE — Progress Notes (Signed)
PROGRESS NOTE    Shannon [name] VHQ:[id number] DOB: 1923/05/21 DOA: 01/10/2015 PCP: Kirt Boys, DO  HPI/Brief narrative 81 y.o. female with history of remote gastrojejunostomy & suspected vagotomy, status post GJ anastomosis resection and GJ revision after perforation of anastomotic ulcer 09/2013, G00 01/2014 with EGD 2: #1 with no anatomic outlet obstruction and normal looking pylorus/duodenum, #2: To breakup bezoar, CT 03/12/14: Recurrent bezoar, thickened pyloric channel, EGD 03/19/14: Tortuous/narrowed anatomy at prepylorus/pylorus but no stricture or ulcer, UGI/SBFT 03/25/14: Normal, CT scan 05/21/14: Gastric distention, stomach filled with fluid/food and thickened pyloric wall, barium swallow 05/22/14: Small esophageal hernia, barium trickled into duodenum, eventually assessed to have functional gastric outlet obstruction (follows with [facility]) presented with 4 day history of progressively worsening abdominal pain. Last BM-small one on 10/8, no flatus since 4 days. In ED, CT abdomen suggestive of PSBO versus ileus. Multiple (3) attempts to place NG tube on floor unsuccessful. IR consulted for fluoroscopic guided NG tube placement. CCS consulted. She had teeth pulled on Wed this week, pain control was with Tylenol 3 but she stopped this after Wed. patient was treated conservatively with bowel rest, NG tube and IV fluids despite which her SBO has not resolved. Patient has repeatedly declined surgery. Palliative care was consulted but family have asked them not to see patient and they signed off 10/14. As per surgery, if SBO not resolving at some point soon, there needs to be decision about feeding etc.   Assessment/Plan:  SBO  - Patient has been treated conservatively since admission including bowel rest, NG tube, IV fluids, serial clinical and x-ray exams. Despite these measures, SBO has not resolved. Patient has repeatedly declined surgery in the past. - Palliative care was consulted: made  DNR. If SBO does not resolve, will have to revisit hospice with daughter. Patient reportedly open to the idea, but daughter was not, and patient deferred (per PMT). However on 10/14, daughter was upset with the palliative care team and have asked them not to see patient again as per patient's wishes apparently - X-rays 10/14 show worsening SBO. - Surgeons have d/w patient and family on 10/15 re surgical risk.  Essential hypertension  Difficult to control. On multiple pgt meds. Doubt GI absorption is reliable. Increased nitrodur. Increased IV vasotec. Cardiology has ordered clonidine TTS. Daughter reports it "burned her skin" and refuses placement. - Blood pressure control continues to be difficult Better today  Type II DM No further lows. Reasonable inpatient control.  Hypothyroid - IV Synthroid  Dehydration with hyponatremia resolved  Stage III chronic kidney disease - Creatinine is at baseline  History of CVA - Continue aspirin  Hypokalemia corrected  Anemia - stable  Elevated troponin Echo without WMA. EF normal. Cardiology signed off. On ASA, beta blocker  Tachycardia: no definite fib/flutter. Continue metoprolol  Severe malnutrition in context of chronic illness NPO a week  DVT prophylaxis: Subcutaneous heparin  Code Status: DO NOT RESUSCITATE. Family Communication: discussed with daughter [name] 01/15/2015. None at bedside today Disposition Plan: Not medically stable for discharge   Consultants:  Gen. Surgery   Palliative-signed off 10/14  Cardiology-signed off  Procedures:  NG tube  2D Echo: Echo Left ventricle: The cavity size was normal. Systolic function was normal. The estimated ejection fraction was in the range of 55% to 60%. Wall motion was normal; there were no regional wall motion abnormalities. - Aortic valve: There was mild to moderate regurgitation. Regurgitation pressure half-time: 365 ms. - Left atrium: The  atrium  was severely dilated. - Right ventricle: The cavity size was normal. Wall thickness was normal. Systolic function was normal. - Tricuspid valve: There was mild regurgitation. - Pulmonary arteries: PA peak pressure: 42 mm Hg (S). - Inferior vena cava: The vessel was normal in size. The respirophasic diameter changes were in the normal range (>= 50%), consistent with normal central venous pressure.   Antibiotics:  None    Subjective: Denies abdominal pain. No flatus or BM since admission. No chest pain or dyspnea reported. No new complaints.  Objective: Filed Vitals:   01/15/15 2228 01/16/15 0013 01/16/15 0426 01/16/15 0859  BP: 168/82 164/87 142/75   Pulse: 81  87   Temp: 98 F (36.7 C)  98.2 F (36.8 C)   TempSrc: Oral  Oral   Resp: 17  18   Height:      Weight:      SpO2: 98%  98% 97%    Intake/Output Summary (Last 24 hours) at 01/16/15 1334 Last data filed at 01/16/15 0600  Gross per 24 hour  Intake   1606 ml  Output    950 ml  Net    656 ml   Filed Weights   01/12/15 1123  Weight: 57.97 kg (127 lb 12.8 oz)     Exam:  General exam: Pleasant elderly frail female lying comfortably supine in bed Respiratory system: Clear to auscultation. No increased work of breathing. Cardiovascular system: S1 and S2 heard, RRR. No JVD or, murmurs or pedal edema Gastrointestinal system: Abdomen is nondistended, soft and nontender. Few bowel sounds heard. NGT+ Extremities: Symmetric 5 x 5 power. CNS: Alert and oriented. No focal deficits.   Data Reviewed: Basic Metabolic Panel:  Recent Labs Lab 01/10/15 0131 01/11/15 0400 01/12/15 0425 01/13/15 0232 01/15/15 0400  NA 133* 136 138 139 145  K 4.0 3.3* 3.8 4.1 4.3  CL 99* 102 106 107 114*  CO2 21* 22 20* 19* 21*  GLUCOSE 136* 113* 88 91 145*  BUN 27* 13 15 16 19   CREATININE 1.12* 0.96 0.86 0.89 0.91  CALCIUM 10.3 9.5 10.0 10.0 9.9   Liver Function Tests:  Recent Labs Lab 01/10/15 0131  AST 29    ALT 19  ALKPHOS 75  BILITOT 1.0  PROT 7.6  ALBUMIN 4.0    Recent Labs Lab 01/10/15 0131  LIPASE 21*   No results for input(s): AMMONIA in the last 168 hours. CBC:  Recent Labs Lab 01/10/15 0131 01/11/15 0400 01/12/15 0425 01/15/15 0400  WBC 8.3 5.9 4.7 6.3  NEUTROABS 7.3  --   --   --   HGB 12.3 11.5* 12.0 12.0  HCT 34.8* 33.1* 34.4* 34.1*  MCV 91.1 92.5 92.2 93.7  PLT 220 212 230 280   Cardiac Enzymes:  Recent Labs Lab 01/12/15 1558 01/12/15 2102 01/13/15 0232  TROPONINI 0.42* 0.35* 0.24*   BNP (last 3 results)  Recent Labs  01/17/14 2116  PROBNP 961.6*   CBG:  Recent Labs Lab 01/15/15 1941 01/15/15 2342 01/16/15 0330 01/16/15 0719 01/16/15 1204  GLUCAP 124* 147* 131* 114* 115*    No results found for this or any previous visit (from the past 240 hour(s)).       Studies: Dg Abd 2 Views  01/15/2015  CLINICAL DATA:  81 year old female with abdominal pain and distention for 6 days. EXAM: ABDOMEN - 2 VIEW COMPARISON:  01/13/2015 FINDINGS: Increasing small bowel distention noted compatible with small bowel obstruction. There is no evidence of pneumoperitoneum. An NG tube with  tip overlying the mid stomach is noted. Cholecystectomy clips are present. No acute bony abnormalities are identified. IMPRESSION: Increasing small bowel distention compatible small bowel obstruction. No evidence of pneumoperitoneum. NG tube with tip overlying the mid stomach. Electronically Signed   By: Harmon Pier M.D.   On: 01/15/2015 13:04       Scheduled Meds: . amLODipine  10 mg Per Tube Daily  . aspirin  81 mg Per Tube Daily  . cloNIDine  0.2 mg Transdermal Weekly  . cycloSPORINE  1 drop Both Eyes BID  . enalaprilat  1.25 mg Intravenous 4 times per day  . fluticasone  2 spray Each Nare Daily  . heparin  5,000 Units Subcutaneous 3 times per day  . hydrALAZINE  10 mg Intravenous Q6H  . Influenza vac split quadrivalent PF  0.5 mL Intramuscular Tomorrow-1000  .  latanoprost  1 drop Both Eyes QHS  . levothyroxine  112.5 mcg Intravenous QAC breakfast  . metoprolol  5 mg Intravenous 4 times per day  . mometasone-formoterol  2 puff Inhalation BID  . nitroGLYCERIN  0.6 mg Transdermal Daily  . pantoprazole (PROTONIX) IV  40 mg Intravenous Q12H  . polyvinyl alcohol  1 drop Both Eyes QID  . sertraline  100 mg Per Tube Daily   Continuous Infusions: . dextrose 5 % and 0.9 % NaCl with KCl 20 mEq/L 50 mL/hr at 01/16/15 0011   Time spent: 15 min minutes.    Marcellus Scott, MD, FACP, FHM. [facility] Pager [phone]  If 7PM-7AM, please contact night-coverage [email or web address] Password [email or web address] 01/16/2015, 1:34 PM    LOS: 6 days

## 2015-01-16 NOTE — Progress Notes (Signed)
Patient ID: Shannon [name], female   DOB: 01-29-1924, 81 y.o.   MRN: [id number]    Subjective: Pt with no flatus or BM.    Objective: Vital signs in last 24 hours: Temp:  [97.8 F (36.6 C)-98.2 F (36.8 C)] 98.2 F (36.8 C) (10/15 0426) Pulse Rate:  [81-87] 87 (10/15 0426) Resp:  [16-18] 18 (10/15 0426) BP: (142-204)/(75-108) 142/75 mmHg (10/15 0426) SpO2:  [97 %-100 %] 97 % (10/15 0859) Last BM Date: 01/07/15  Intake/Output from previous day: 01-23-2023 0701 - 10/15 0700 In: 1676 [P.O.:230; I.V.:1206; NG/GT:240] Out: 1400 [Urine:700; Emesis/NG output:700] Intake/Output this shift:    PE: Abd: soft, mildly tender on left side, hypoactive BS, NGT with frankly bilious output filling the cannister  Lab Results:   Recent Labs  2015-01-23 0400  WBC 6.3  HGB 12.0  HCT 34.1*  PLT 280   BMET  Recent Labs  January 23, 2015 0400  NA 145  K 4.3  CL 114*  CO2 21*  GLUCOSE 145*  BUN 19  CREATININE 0.91  CALCIUM 9.9   PT/INR No results for input(s): LABPROT, INR in the last 72 hours. CMP     Component Value Date/Time   NA 145 01-23-15 0400   NA 137 11/02/2014 1029   K 4.3 2015-01-23 0400   CL 114* 2015/01/23 0400   CO2 21* 01/23/15 0400   GLUCOSE 145* 01/23/2015 0400   GLUCOSE 94 11/02/2014 1029   BUN 19 January 23, 2015 0400   BUN 31* 11/02/2014 1029   CREATININE 0.91 01-23-2015 0400   CREATININE 1.3* 05/06/2014   CALCIUM 9.9 Jan 23, 2015 0400   PROT 7.6 01/10/2015 0131   PROT 6.8 11/02/2014 1029   ALBUMIN 4.0 01/10/2015 0131   ALBUMIN 4.1 11/02/2014 1029   AST 29 01/10/2015 0131   ALT 19 01/10/2015 0131   ALKPHOS 75 01/10/2015 0131   BILITOT 1.0 01/10/2015 0131   BILITOT 0.3 11/02/2014 1029   GFRNONAA 54* 2015/01/23 0400   GFRAA >60 01-23-15 0400   Lipase     Component Value Date/Time   LIPASE 21* 01/10/2015 0131       Studies/Results: Dg Abd 2 Views  01/23/2015  CLINICAL DATA:  81 year old female with abdominal pain and distention for 6 days. EXAM: ABDOMEN -  2 VIEW COMPARISON:  01/13/2015 FINDINGS: Increasing small bowel distention noted compatible with small bowel obstruction. There is no evidence of pneumoperitoneum. An NG tube with tip overlying the mid stomach is noted. Cholecystectomy clips are present. No acute bony abnormalities are identified. IMPRESSION: Increasing small bowel distention compatible small bowel obstruction. No evidence of pneumoperitoneum. NG tube with tip overlying the mid stomach. Electronically Signed   By: Harmon Pier M.D.   On: 01-23-15 13:04    Anti-infectives: Anti-infectives    Start     Dose/Rate Route Frequency Ordered Stop   01/10/15 2200  amoxicillin (AMOXIL) 250 MG/5ML suspension 250 mg  Status:  Discontinued     250 mg Oral Every 12 hours 01/10/15 1703 01/10/15 1704   01/10/15 2200  amoxicillin (AMOXIL) 250 MG/5ML suspension 250 mg  Status:  Discontinued     250 mg Per Tube Every 12 hours 01/10/15 1704 01/10/15 1725   01/10/15 1730  amoxicillin (AMOXIL) 250 MG/5ML suspension 500 mg  Status:  Discontinued     500 mg Per Tube 3 times per day 01/10/15 1725 01/13/15 1333       Assessment/Plan  1. SBO -patient has been adamant her entire stay about not wanting an operation even if  that were to mean she would pass from this.  Daughter apparently became upset yesterday when she found out palliative care met with her mother and has fired them.  Today when discussing this with the patient she now seems to be Shannon [name] about what she truly wants.  I discussed her options really were to have an operation or to not have an operation, which would result in hospice care.  She seems surprised by this this morning, which is overall surprising as our previous discussions with her about this, she has seemed just fine with that.  Ultimately, she would like to wait until her daughter is here to have a discussion about what to do. -cont NGT for now as her obstruction is no better.   LOS: 6 days    Shannon [name] 01/16/2015,  11:07 AM Pager: [phone]

## 2015-01-17 ENCOUNTER — Inpatient Hospital Stay (HOSPITAL_COMMUNITY): Payer: Medicare Other

## 2015-01-17 LAB — GLUCOSE, CAPILLARY
Glucose-Capillary: 140 mg/dL — ABNORMAL HIGH (ref 65–99)
Glucose-Capillary: 107 mg/dL — ABNORMAL HIGH (ref 65–99)
Glucose-Capillary: 102 mg/dL — ABNORMAL HIGH (ref 65–99)
Glucose-Capillary: 116 mg/dL — ABNORMAL HIGH (ref 65–99)
Glucose-Capillary: 94 mg/dL (ref 65–99)

## 2015-01-17 MED ORDER — ALUM & MAG HYDROXIDE-SIMETH 200-200-20 MG/5ML PO SUSP
30.0000 mL | Freq: Four times a day (QID) | ORAL | Status: DC | PRN
  Administered 2015-01-24 – 2015-01-25 (×2): 30 mL via ORAL
  Filled 2015-01-17 (×3): qty 30

## 2015-01-17 MED ORDER — BLISTEX MEDICATED EX OINT
TOPICAL_OINTMENT | Freq: Two times a day (BID) | CUTANEOUS | Status: DC
  Administered 2015-01-17 – 2015-01-23 (×12): via TOPICAL
  Filled 2015-01-17 (×3): qty 10

## 2015-01-17 MED ORDER — PROMETHAZINE HCL 25 MG/ML IJ SOLN
6.2500 mg | INTRAMUSCULAR | Status: DC | PRN
  Administered 2015-01-24: 6.25 mg via INTRAVENOUS
  Filled 2015-01-17 (×2): qty 1

## 2015-01-17 MED ORDER — LACTATED RINGERS IV BOLUS (SEPSIS)
1000.0000 mL | Freq: Once | INTRAVENOUS | Status: AC
  Administered 2015-01-17: 1000 mL via INTRAVENOUS

## 2015-01-17 MED ORDER — LACTATED RINGERS IV BOLUS (SEPSIS): 1000.0000 mL | Freq: Three times a day (TID) | INTRAVENOUS | Status: AC | PRN

## 2015-01-17 MED ORDER — MAGIC MOUTHWASH
15.0000 mL | Freq: Four times a day (QID) | ORAL | Status: DC | PRN
  Filled 2015-01-17: qty 15

## 2015-01-17 MED ORDER — MENTHOL 3 MG MT LOZG: 1.0000 | LOZENGE | OROMUCOSAL | Status: DC | PRN

## 2015-01-17 MED ORDER — DIPHENHYDRAMINE HCL 50 MG/ML IJ SOLN
12.5000 mg | Freq: Four times a day (QID) | INTRAMUSCULAR | Status: DC | PRN
  Filled 2015-01-17: qty 0.5

## 2015-01-17 MED ORDER — ACETAMINOPHEN 650 MG RE SUPP
650.0000 mg | Freq: Four times a day (QID) | RECTAL | Status: DC | PRN
  Administered 2015-01-21: 650 mg via RECTAL
  Filled 2015-01-17: qty 1

## 2015-01-17 MED ORDER — PHENOL 1.4 % MT LIQD
2.0000 | OROMUCOSAL | Status: DC | PRN
  Administered 2015-01-22: 2 via OROMUCOSAL
  Filled 2015-01-17: qty 177

## 2015-01-17 MED ORDER — LIP MEDEX EX OINT: 1.0000 "application " | TOPICAL_OINTMENT | Freq: Two times a day (BID) | CUTANEOUS | Status: DC

## 2015-01-17 MED ORDER — MORPHINE SULFATE (PF) 2 MG/ML IV SOLN
2.0000 mg | INTRAVENOUS | Status: DC | PRN
Start: 2015-01-17 — End: 2015-01-20

## 2015-01-17 MED ORDER — BISACODYL 10 MG RE SUPP
10.0000 mg | Freq: Every day | RECTAL | Status: DC
  Administered 2015-01-17 – 2015-01-18 (×2): 10 mg via RECTAL
  Filled 2015-01-17 (×2): qty 1

## 2015-01-17 MED ORDER — METOPROLOL TARTRATE 1 MG/ML IV SOLN
5.0000 mg | Freq: Four times a day (QID) | INTRAVENOUS | Status: DC | PRN
  Filled 2015-01-17: qty 5

## 2015-01-17 NOTE — Progress Notes (Signed)
PROGRESS NOTE    Shannon [name] WUJ:[id number] DOB: [birth year] DOA: 01/10/2015 PCP: Kirt Boys, DO  HPI/Brief narrative 81 y.o. female with history of remote gastrojejunostomy & suspected vagotomy, status post GJ anastomosis resection and GJ revision after perforation of anastomotic ulcer 09/2013, G00 01/2014 with EGD 2: #1 with no anatomic outlet obstruction and normal looking pylorus/duodenum, #2: To breakup bezoar, CT 03/12/14: Recurrent bezoar, thickened pyloric channel, EGD 03/19/14: Tortuous/narrowed anatomy at prepylorus/pylorus but no stricture or ulcer, UGI/SBFT 03/25/14: Normal, CT scan 05/21/14: Gastric distention, stomach filled with fluid/food and thickened pyloric wall, barium swallow 05/22/14: Small esophageal hernia, barium trickled into duodenum, eventually assessed to have functional gastric outlet obstruction (follows with [facility]) presented with 4 day history of progressively worsening abdominal pain. Last BM-small one on 10/8, no flatus since 4 days. In ED, CT abdomen suggestive of PSBO versus ileus. Multiple (3) attempts to place NG tube on floor unsuccessful. IR consulted for fluoroscopic guided NG tube placement. CCS consulted. She had teeth pulled on Wed this week, pain control was with Tylenol 3 but she stopped this after Wed. patient was treated conservatively with bowel rest, NG tube and IV fluids despite which her SBO has not resolved. Patient has repeatedly declined surgery. Palliative care was consulted but family have asked them not to see patient and they signed off 10/14. As per surgery, if SBO not resolving at some point soon, there needs to be decision about feeding etc.   Assessment/Plan:  SBO  - Patient has been treated conservatively since admission including bowel rest, NG tube, IV fluids, serial clinical and x-ray exams. Despite these measures, SBO has not resolved. Patient has repeatedly declined surgery in the past. - Palliative care was consulted: made  DNR. If SBO does not resolve, will have to revisit hospice with daughter. Patient reportedly open to the idea, but daughter was not, and patient deferred (per PMT). However on 10/14, daughter was upset with the palliative care team and have asked them not to see patient again as per patient's wishes apparently - X-rays 10/14 show worsening SBO. 10/16: Shows persisting SBO - Surgeons have d/w patient and family on 10/15 re surgical risk. - As per discussion with patient on 10/16, daughter seems to be pushing her to have surgery but patient seems to be on the fence. Advised her that she will have to make a final decision. She verbalizes understanding. - If surgery is opted, she will be very high risk and will need to get CCM involved perioperatively & postop will need ICU care.  Essential hypertension  Difficult to control. On multiple pgt meds. Doubt GI absorption is reliable. Increased nitrodur. Increased IV vasotec. Cardiology has ordered clonidine TTS. Daughter reports it "burned her skin" and refuses placement. - Blood pressure control seems to have improved.  Type II DM No further lows. Reasonable inpatient control.  Hypothyroid - IV Synthroid  Dehydration with hyponatremia resolved  Stage III chronic kidney disease - Creatinine is at baseline  History of CVA - Continue aspirin  Hypokalemia corrected  Anemia - stable  Elevated troponin Echo without WMA. EF normal. Cardiology signed off. On ASA, beta blocker  Tachycardia: no definite fib/flutter. Continue metoprolol  Severe malnutrition in context of chronic illness NPO since admission 01/10/15   DVT prophylaxis: Subcutaneous heparin  Code Status: DO NOT RESUSCITATE. Family Communication: discussed with daughter [name] 01/15/2015. None at bedside today Disposition Plan: Not medically stable for discharge   Consultants:  Gen. Surgery   Palliative-signed  off 10/14  Cardiology-signed  off  Procedures:  NG tube  2D Echo: Echo Left ventricle: The cavity size was normal. Systolic function was normal. The estimated ejection fraction was in the range of 55% to 60%. Wall motion was normal; there were no regional wall motion abnormalities. - Aortic valve: There was mild to moderate regurgitation. Regurgitation pressure half-time: 365 ms. - Left atrium: The atrium was severely dilated. - Right ventricle: The cavity size was normal. Wall thickness was normal. Systolic function was normal. - Tricuspid valve: There was mild regurgitation. - Pulmonary arteries: PA peak pressure: 42 mm Hg (S). - Inferior vena cava: The vessel was normal in size. The respirophasic diameter changes were in the normal range (>= 50%), consistent with normal central venous pressure.   Antibiotics:  None    Subjective: Past small amount of flatus this morning. No BM yet. Denies abdominal pain.  Objective: Filed Vitals:   01/16/15 1947 01/16/15 2140 01/17/15 0040 01/17/15 0512  BP: 159/76  161/74 147/70  Pulse: 68  78 121  Temp: 98.6 F (37 C)  98.4 F (36.9 C) 98.1 F (36.7 C)  TempSrc: Oral  Oral Oral  Resp: 18  18 18   Height:      Weight:      SpO2: 99% 98% 99% 99%    Intake/Output Summary (Last 24 hours) at 01/17/15 1240 Last data filed at 01/17/15 0600  Gross per 24 hour  Intake   1500 ml  Output   1300 ml  Net    200 ml   Filed Weights   01/12/15 1123  Weight: 57.97 kg (127 lb 12.8 oz)     Exam:  General exam: Pleasant elderly frail female lying comfortably supine in bed Respiratory system: Clear to auscultation. No increased work of breathing. Cardiovascular system: S1 and S2 heard, RRR. No JVD or, murmurs or pedal edema Gastrointestinal system: Abdomen is nondistended, soft and nontender. Few bowel sounds heard. NGT+ Extremities: Symmetric 5 x 5 power. CNS: Alert and oriented. No focal deficits.   Data Reviewed: Basic Metabolic  Panel:  Recent Labs Lab 01/11/15 0400 01/12/15 0425 01/13/15 0232 01/15/15 0400  NA 136 138 139 145  K 3.3* 3.8 4.1 4.3  CL 102 106 107 114*  CO2 22 20* 19* 21*  GLUCOSE 113* 88 91 145*  BUN 13 15 16 19   CREATININE 0.96 0.86 0.89 0.91  CALCIUM 9.5 10.0 10.0 9.9   Liver Function Tests: No results for input(s): AST, ALT, ALKPHOS, BILITOT, PROT, ALBUMIN in the last 168 hours. No results for input(s): LIPASE, AMYLASE in the last 168 hours. No results for input(s): AMMONIA in the last 168 hours. CBC:  Recent Labs Lab 01/11/15 0400 01/12/15 0425 01/15/15 0400  WBC 5.9 4.7 6.3  HGB 11.5* 12.0 12.0  HCT 33.1* 34.4* 34.1*  MCV 92.5 92.2 93.7  PLT 212 230 280   Cardiac Enzymes:  Recent Labs Lab 01/12/15 1558 01/12/15 2102 01/13/15 0232  TROPONINI 0.42* 0.35* 0.24*   BNP (last 3 results)  Recent Labs  01/17/14 2116  PROBNP 961.6*   CBG:  Recent Labs Lab 01/16/15 1944 01/16/15 2351 01/17/15 0358 01/17/15 0747 01/17/15 1233  GLUCAP 124* 135* 140* 107* 102*    No results found for this or any previous visit (from the past 240 hour(s)).       Studies: Dg Abd 2 Views  01/17/2015  CLINICAL DATA:  SBO; pt states she is feeling better today due to flatulence this morning EXAM: ABDOMEN -  2 VIEW COMPARISON:  01/15/2015 FINDINGS: Upright and supine views. The upright view demonstrates a nasogastric tube looped within the body of the stomach. Multiple leads and wires projecting over the abdomen. Multiple small bowel air-fluid levels. No free intraperitoneal air. The supine image demonstrates small bowel distension, example loop 3.8 cm. Compare 4.0 cm at the same level on the prior. small volume colonic gas. No pneumatosis. IMPRESSION: Similar partial small bowel obstruction. No free intraperitoneal air or other acute complication. Electronically Signed   By: Jeronimo Greaves M.D.   On: 01/17/2015 09:08   Dg Abd 2 Views  01/15/2015  CLINICAL DATA:  81 year old female  with abdominal pain and distention for 6 days. EXAM: ABDOMEN - 2 VIEW COMPARISON:  01/13/2015 FINDINGS: Increasing small bowel distention noted compatible with small bowel obstruction. There is no evidence of pneumoperitoneum. An NG tube with tip overlying the mid stomach is noted. Cholecystectomy clips are present. No acute bony abnormalities are identified. IMPRESSION: Increasing small bowel distention compatible small bowel obstruction. No evidence of pneumoperitoneum. NG tube with tip overlying the mid stomach. Electronically Signed   By: Harmon Pier M.D.   On: 01/15/2015 13:04       Scheduled Meds: . amLODipine  10 mg Per Tube Daily  . aspirin  81 mg Per Tube Daily  . cloNIDine  0.2 mg Transdermal Weekly  . cycloSPORINE  1 drop Both Eyes BID  . enalaprilat  1.25 mg Intravenous 4 times per day  . fluticasone  2 spray Each Nare Daily  . heparin  5,000 Units Subcutaneous 3 times per day  . hydrALAZINE  10 mg Intravenous Q6H  . Influenza vac split quadrivalent PF  0.5 mL Intramuscular Tomorrow-1000  . latanoprost  1 drop Both Eyes QHS  . levothyroxine  112.5 mcg Intravenous QAC breakfast  . metoprolol  5 mg Intravenous 4 times per day  . mometasone-formoterol  2 puff Inhalation BID  . nitroGLYCERIN  0.6 mg Transdermal Daily  . pantoprazole (PROTONIX) IV  40 mg Intravenous Q12H  . polyvinyl alcohol  1 drop Both Eyes QID  . sertraline  100 mg Per Tube Daily   Continuous Infusions: . dextrose 5 % and 0.9 % NaCl with KCl 20 mEq/L 50 mL/hr at 01/16/15 2046   Time spent: 15 min minutes.    Marcellus Scott, MD, FACP, FHM. [facility] Pager [phone]  If 7PM-7AM, please contact night-coverage [email or web address] Password [email or web address] 01/17/2015, 12:40 PM    LOS: 7 days

## 2015-01-17 NOTE — Progress Notes (Signed)
Pt was without pain entire shift, NGT clamped 3:30 for 4 hours, no c/o drank sips of chicken broth & ginger ale.  At 7:30 she became very uncomfortable in abd & c/o constipation, the rectal supp result was very small.  Pt back to suction @ 7:35pm.

## 2015-01-17 NOTE — Progress Notes (Signed)
Patient ID: Shannon [name], female   DOB: 07/09/1923, 81 y.o.   MRN: [id number]    Subjective: Pt still undecided with her daughter what she would like to do.  Daughter seems to still really be pushing for her mother to have surgery per report.  Daughter not in the room while I'm in there today. She did pass a little flatus this morning  Objective: Vital signs in last 24 hours: Temp:  [98 F (36.7 C)-98.6 F (37 C)] 98.1 F (36.7 C) 2023/02/03 0512) Pulse Rate:  [68-121] 121 02-03-2023 0512) Resp:  [16-18] 18 02-03-2023 0512) BP: (147-161)/(70-85) 147/70 mmHg Feb 03, 2023 0512) SpO2:  [98 %-99 %] 99 % Feb 03, 2023 0512) Last BM Date: 01/07/15  Intake/Output from previous day: 10/15 0701 - 02/03/2023 0700 In: 1670 [P.O.:470; I.V.:1200] Out: 1950 [Urine:1250; Emesis/NG output:700] Intake/Output this shift:    PE: Abd: soft, NT, NGT output is actually all water today.  There is no bilious tinge today.  Few BS  Lab Results:   Recent Labs  01/15/15 0400  WBC 6.3  HGB 12.0  HCT 34.1*  PLT 280   BMET  Recent Labs  01/15/15 0400  NA 145  K 4.3  CL 114*  CO2 21*  GLUCOSE 145*  BUN 19  CREATININE 0.91  CALCIUM 9.9   PT/INR No results for input(s): LABPROT, INR in the last 72 hours. CMP     Component Value Date/Time   NA 145 01/15/2015 0400   NA 137 11/02/2014 1029   K 4.3 01/15/2015 0400   CL 114* 01/15/2015 0400   CO2 21* 01/15/2015 0400   GLUCOSE 145* 01/15/2015 0400   GLUCOSE 94 11/02/2014 1029   BUN 19 01/15/2015 0400   BUN 31* 11/02/2014 1029   CREATININE 0.91 01/15/2015 0400   CREATININE 1.3* 05/06/2014   CALCIUM 9.9 01/15/2015 0400   PROT 7.6 01/10/2015 0131   PROT 6.8 11/02/2014 1029   ALBUMIN 4.0 01/10/2015 0131   ALBUMIN 4.1 11/02/2014 1029   AST 29 01/10/2015 0131   ALT 19 01/10/2015 0131   ALKPHOS 75 01/10/2015 0131   BILITOT 1.0 01/10/2015 0131   BILITOT 0.3 11/02/2014 1029   GFRNONAA 54* 01/15/2015 0400   GFRAA >60 01/15/2015 0400   Lipase     Component Value  Date/Time   LIPASE 21* 01/10/2015 0131       Studies/Results: Dg Abd 2 Views  03-Feb-2015  CLINICAL DATA:  SBO; pt states she is feeling better today due to flatulence this morning EXAM: ABDOMEN - 2 VIEW COMPARISON:  01/15/2015 FINDINGS: Upright and supine views. The upright view demonstrates a nasogastric tube looped within the body of the stomach. Multiple leads and wires projecting over the abdomen. Multiple small bowel air-fluid levels. No free intraperitoneal air. The supine image demonstrates small bowel distension, example loop 3.8 cm. Compare 4.0 cm at the same level on the prior. small volume colonic gas. No pneumatosis. IMPRESSION: Similar partial small bowel obstruction. No free intraperitoneal air or other acute complication. Electronically Signed   By: Jeronimo Greaves M.D.   On: 02-03-2015 09:08   Dg Abd 2 Views  01/15/2015  CLINICAL DATA:  81 year old female with abdominal pain and distention for 6 days. EXAM: ABDOMEN - 2 VIEW COMPARISON:  01/13/2015 FINDINGS: Increasing small bowel distention noted compatible with small bowel obstruction. There is no evidence of pneumoperitoneum. An NG tube with tip overlying the mid stomach is noted. Cholecystectomy clips are present. No acute bony abnormalities are identified. IMPRESSION: Increasing small bowel distention  compatible small bowel obstruction. No evidence of pneumoperitoneum. NG tube with tip overlying the mid stomach. Electronically Signed   By: Harmon Pier M.D.   On: 01/15/2015 13:04    Anti-infectives: Anti-infectives    Start     Dose/Rate Route Frequency Ordered Stop   01/10/15 2200  amoxicillin (AMOXIL) 250 MG/5ML suspension 250 mg  Status:  Discontinued     250 mg Oral Every 12 hours 01/10/15 1703 01/10/15 1704   01/10/15 2200  amoxicillin (AMOXIL) 250 MG/5ML suspension 250 mg  Status:  Discontinued     250 mg Per Tube Every 12 hours 01/10/15 1704 01/10/15 1725   01/10/15 1730  amoxicillin (AMOXIL) 250 MG/5ML suspension 500  mg  Status:  Discontinued     500 mg Per Tube 3 times per day 01/10/15 1725 01/13/15 1333       Assessment/Plan  1. SBO -patient's films today do show a little contrast, air, and stool in her colon, but still show a fairly high grade at least psbo. However, he NGT output has now changed to water/ice chip output and not really bilious.  She may be improving conservatively some, but I would still be very hesitant to think she is going to truly improve to where she could eat without needing some type of operative intervention.  Obviously, the patient has been very against this her entire hospital stay.  I told her today that her and her daughter need to really discuss this and start making some decisions on how they want to proceed.   LOS: 7 days    Sheranda Seabrooks E 01/17/2015, 10:48 AM Pager: [phone]

## 2015-01-18 ENCOUNTER — Inpatient Hospital Stay (HOSPITAL_COMMUNITY): Payer: Medicare Other

## 2015-01-18 LAB — GLUCOSE, CAPILLARY
Glucose-Capillary: 105 mg/dL — ABNORMAL HIGH (ref 65–99)
Glucose-Capillary: 111 mg/dL — ABNORMAL HIGH (ref 65–99)
Glucose-Capillary: 135 mg/dL — ABNORMAL HIGH (ref 65–99)
Glucose-Capillary: 136 mg/dL — ABNORMAL HIGH (ref 65–99)
Glucose-Capillary: 138 mg/dL — ABNORMAL HIGH (ref 65–99)
Glucose-Capillary: 142 mg/dL — ABNORMAL HIGH (ref 65–99)

## 2015-01-18 LAB — CBC
HCT: 31.4 % — ABNORMAL LOW (ref 36.0–46.0)
Hemoglobin: 10.7 g/dL — ABNORMAL LOW (ref 12.0–15.0)
MCH: 31.9 pg (ref 26.0–34.0)
MCHC: 34.1 g/dL (ref 30.0–36.0)
MCV: 93.7 fL (ref 78.0–100.0)
Platelets: 258 10*3/uL (ref 150–400)
RBC: 3.35 MIL/uL — ABNORMAL LOW (ref 3.87–5.11)
RDW: 14.7 % (ref 11.5–15.5)
WBC: 6.7 10*3/uL (ref 4.0–10.5)

## 2015-01-18 LAB — BASIC METABOLIC PANEL
Anion gap: 9 (ref 5–15)
BUN: 13 mg/dL (ref 6–20)
CO2: 21 mmol/L — ABNORMAL LOW (ref 22–32)
Calcium: 8.9 mg/dL (ref 8.9–10.3)
Chloride: 108 mmol/L (ref 101–111)
Creatinine, Ser: 1.03 mg/dL — ABNORMAL HIGH (ref 0.44–1.00)
GFR calc Af Amer: 54 mL/min — ABNORMAL LOW (ref >60)
GFR calc non Af Amer: 47 mL/min — ABNORMAL LOW (ref >60)
Glucose, Bld: 144 mg/dL — ABNORMAL HIGH (ref 65–99)
Potassium: 3.6 mmol/L (ref 3.5–5.1)
Sodium: 138 mmol/L (ref 135–145)

## 2015-01-18 MED ORDER — BOOST / RESOURCE BREEZE PO LIQD: 1.0000 | Freq: Three times a day (TID) | ORAL | Status: DC

## 2015-01-18 NOTE — Progress Notes (Signed)
Nutrition Follow-up  DOCUMENTATION CODES:   Severe malnutrition in context of chronic illness  INTERVENTION:   -Boost Breeze po TID, each supplement provides 250 kcal and 9 grams of protein -If prolonged NPO/clear liquid status is anticipated, consider initiation of nutrition support  NUTRITION DIAGNOSIS:   Inadequate oral intake related to altered GI function as evidenced by  (NPO/clear liquids x 8 days).  Ongoing  GOAL:   Patient will meet greater than or equal to 90% of their needs  Unmet  MONITOR:   PO intake, Supplement acceptance, Diet advancement, Labs, Weight trends, Skin, I & O's  REASON FOR ASSESSMENT:   NPO/Clear Liquid Diet    ASSESSMENT:   Shannon [name] is a 81 y.o. female with h/o gastric outlet obstruction, SBO, sequela of a perforated pyloric / duodenal ulcer that she had during summer 2015. Patient presents to the ED with c/o gradually worsening, generalized abdominal pain. Symptoms onset 1 day ago. No BM since Thursday at home and unable to pass gas either. She had teeth pulled on Wed this week, pain control was with Tylenol 3 but she stopped this after Wed.  Pt remains with NGT for low, intermittent suction. Noted 1150 ml output within the past 24 hours per doc flowsheets.   Pt has been NPO/clear liquids since admission (8 days). Clear liquid diet was initiated on 01/17/15. RD will add Boost Breeze supplement.   Surgical team working with pt and family to discuss plan; pt reluctant to purse surgical interventions. Per chart review, pt remains with high grader PSBO. Palliative care was following, but family has refused further visits.   Labs reviewed.  Diet Order:  Diet clear liquid Room service appropriate?: Yes; Fluid consistency:: Thin; Fluid restriction:: 2000 mL Fluid  Skin:  Reviewed, no issues  Last BM:  01/07/15  Height:   Ht Readings from Last 1 Encounters:  01/12/15 5\' 6"  (1.676 m)    Weight:   Wt Readings from Last 1 Encounters:   01/12/15 127 lb 12.8 oz (57.97 kg)    Ideal Body Weight:  59.1 kg  BMI:  Body mass index is 20.64 kg/(m^2).  Estimated Nutritional Needs:   Kcal:  1400-1600  Protein:  70-85 grams  Fluid:  1.4-1.6 L  EDUCATION NEEDS:   Education needs addressed  Shannon [name] A. Jimmye Norman, RD, LDN, CDE Pager: [phone] After hours Pager: [phone]

## 2015-01-18 NOTE — Plan of Care (Signed)
Family refusing palliative consultation. I have reviewed the chart. We verified HCPOA and Advance Care planning which upset family. Please re-consult if appropriate.  Lane Hacker, DO Palliative Medicine

## 2015-01-18 NOTE — Care Management Important Message (Signed)
Important Message  Patient Details  Name: Shannon [name] MRN: [id number] Date of Birth: [birth year]   Medicare Important Message Given:  Yes-third notification given    Delorse Lek 01/18/2015, 2:01 PM

## 2015-01-18 NOTE — Progress Notes (Signed)
PROGRESS NOTE    Shannon [name] XBM:[id number] DOB: Oct 11, 1923 DOA: 01/10/2015 PCP: Kirt Boys, DO  HPI/Brief narrative 81 y.o. female with history of remote gastrojejunostomy & suspected vagotomy, status post GJ anastomosis resection and GJ revision after perforation of anastomotic ulcer 09/2013, G00 01/2014 with EGD 2: #1 with no anatomic outlet obstruction and normal looking pylorus/duodenum, #2: To breakup bezoar, CT 03/12/14: Recurrent bezoar, thickened pyloric channel, EGD 03/19/14: Tortuous/narrowed anatomy at prepylorus/pylorus but no stricture or ulcer, UGI/SBFT 03/25/14: Normal, CT scan 05/21/14: Gastric distention, stomach filled with fluid/food and thickened pyloric wall, barium swallow 05/22/14: Small esophageal hernia, barium trickled into duodenum, eventually assessed to have functional gastric outlet obstruction (follows with [facility]) presented with 4 day history of progressively worsening abdominal pain. Last BM-small one on 10/8, no flatus since 4 days. In ED, CT abdomen suggestive of PSBO versus ileus. Multiple (3) attempts to place NG tube on floor unsuccessful. IR consulted for fluoroscopic guided NG tube placement. CCS consulted. She had teeth pulled on Wed this week, pain control was with Tylenol 3 but she stopped this after Wed. patient was treated conservatively with bowel rest, NG tube and IV fluids despite which her SBO has not resolved. Patient has repeatedly declined surgery. Palliative care was consulted but family have asked them not to see patient and they signed off 10/14. As per surgery, if SBO not resolving at some point soon, there needs to be decision about feeding etc.   Assessment/Plan:  SBO  - Patient has been treated conservatively since admission including bowel rest, NG tube, IV fluids, serial clinical and x-ray exams. Despite these measures, SBO has not resolved. Patient has repeatedly declined surgery in the past. - Palliative care was consulted: made  DNR. If SBO does not resolve, will have to revisit hospice with daughter. Patient reportedly open to the idea, but daughter was not, and patient deferred (per PMT). However on 10/14, daughter was upset with the palliative care team and have asked them not to see patient again as per patient's wishes apparently - X-rays 10/14 show worsening SBO. 10/16: Shows persisting SBO - Surgeons have d/w patient and family on 10/15 re surgical risk. - As per discussion with patient on 10/16, daughter seems to be pushing her to have surgery but patient seems to be on the fence. Advised her that she will have to make a final decision. She verbalizes understanding. - If surgery is opted, she will be very high risk and will need to get CCM involved perioperatively & postop will need ICU care.  Essential hypertension  Difficult to control. On multiple pgt meds. Doubt GI absorption is reliable. Increased nitrodur. Increased IV vasotec. Cardiology has ordered clonidine TTS. Daughter reports it "burned her skin" and refuses placement. - Blood pressure control seems to have improved.  Type II DM No further lows. Reasonable inpatient control.  Hypothyroid - IV Synthroid  Dehydration with hyponatremia resolved  Stage III chronic kidney disease - Creatinine is at baseline  History of CVA - Continue aspirin  Hypokalemia corrected  Anemia - stable  Elevated troponin Echo without WMA. EF normal. Cardiology signed off. On ASA, beta blocker  Tachycardia: no definite fib/flutter. Continue metoprolol  Severe malnutrition in context of chronic illness NPO since admission 01/10/15   DVT prophylaxis: Subcutaneous heparin  Code Status: DO NOT RESUSCITATE. Family Communication: discussed with daughter [name]  Disposition Plan: Not medically stable for discharge   Consultants:  Gen. Surgery   Palliative-signed off 10/14  Cardiology-signed  off  Procedures:  NG tube  2D  Echo: Echo Left ventricle: The cavity size was normal. Systolic function was normal. The estimated ejection fraction was in the range of 55% to 60%. Wall motion was normal; there were no regional wall motion abnormalities. - Aortic valve: There was mild to moderate regurgitation. Regurgitation pressure half-time: 365 ms. - Left atrium: The atrium was severely dilated. - Right ventricle: The cavity size was normal. Wall thickness was normal. Systolic function was normal. - Tricuspid valve: There was mild regurgitation. - Pulmonary arteries: PA peak pressure: 42 mm Hg (S). - Inferior vena cava: The vessel was normal in size. The respirophasic diameter changes were in the normal range (>= 50%), consistent with normal central venous pressure.   Antibiotics:  None    Subjective: HR rate to 150s with PT Patient states she is uncomfortable  Objective: Filed Vitals:   01/17/15 1743 01/17/15 2137 01/18/15 0430 01/18/15 0851  BP: 187/73 178/87 154/56   Pulse:  77 85   Temp:  98 F (36.7 C) 98.2 F (36.8 C)   TempSrc:  Oral Oral   Resp: 18 18 18    Height:      Weight:      SpO2: 100% 98% 98% 99%    Intake/Output Summary (Last 24 hours) at 01/18/15 1424 Last data filed at 01/18/15 1011  Gross per 24 hour  Intake 2234.17 ml  Output   1600 ml  Net 634.17 ml   Filed Weights   01/12/15 1123  Weight: 57.97 kg (127 lb 12.8 oz)     Exam:  General exam: uncomfortable appearing; NG tube in place Respiratory system: Clear to auscultation. No increased work of breathing. Cardiovascular system: S1 and S2 heard, RRR. No JVD or, murmurs or pedal edema Gastrointestinal system: Abdomen is nondistended, soft and nontender. Few bowel sounds heard. NGT+ Extremities: Symmetric 5 x 5 power. CNS: Alert and oriented. No focal deficits.   Data Reviewed: Basic Metabolic Panel:  Recent Labs Lab 01/12/15 0425 01/13/15 0232 01/15/15 0400 01/18/15 0323  NA 138 139 145  138  K 3.8 4.1 4.3 3.6  CL 106 107 114* 108  CO2 20* 19* 21* 21*  GLUCOSE 88 91 145* 144*  BUN 15 16 19 13   CREATININE 0.86 0.89 0.91 1.03*  CALCIUM 10.0 10.0 9.9 8.9   Liver Function Tests: No results for input(s): AST, ALT, ALKPHOS, BILITOT, PROT, ALBUMIN in the last 168 hours. No results for input(s): LIPASE, AMYLASE in the last 168 hours. No results for input(s): AMMONIA in the last 168 hours. CBC:  Recent Labs Lab 01/12/15 0425 01/15/15 0400 01/18/15 0323  WBC 4.7 6.3 6.7  HGB 12.0 12.0 10.7*  HCT 34.4* 34.1* 31.4*  MCV 92.2 93.7 93.7  PLT 230 280 258   Cardiac Enzymes:  Recent Labs Lab 01/12/15 1558 01/12/15 2102 01/13/15 0232  TROPONINI 0.42* 0.35* 0.24*   BNP (last 3 results) No results for input(s): PROBNP in the last 8760 hours. CBG:  Recent Labs Lab 01/17/15 1949 01/17/15 2359 01/18/15 0428 01/18/15 0808 01/18/15 1154  GLUCAP 94 111* 136* 105* 142*    No results found for this or any previous visit (from the past 240 hour(s)).       Studies: Dg Abd 2 Views  01/18/2015  CLINICAL DATA:  Small bowel obstruction.  Enteric tube. EXAM: ABDOMEN - 2 VIEW COMPARISON:  Abdominal radiograph from 1 day prior. FINDINGS: Enteric tube loops in the proximal stomach and terminates in the body of  the stomach. Surgical sutures are again seen in the left upper quadrant. Mildly to moderately dilated small bowel loops in the central and right abdomen measuring up to 4.4 cm diameter, not appreciably changed. Mild stool throughout the colon. No evidence of pneumatosis or pneumoperitoneum. IMPRESSION: 1. Enteric tube loops in the proximal stomach and terminates in the body of the stomach. 2. No appreciable change in mild-to-moderate small bowel dilatation, in keeping with persistent distal small bowel obstruction. Electronically Signed   By: Delbert Phenix M.D.   On: 01/18/2015 07:58   Dg Abd 2 Views  01/17/2015  CLINICAL DATA:  SBO; pt states she is feeling better today  due to flatulence this morning EXAM: ABDOMEN - 2 VIEW COMPARISON:  01/15/2015 FINDINGS: Upright and supine views. The upright view demonstrates a nasogastric tube looped within the body of the stomach. Multiple leads and wires projecting over the abdomen. Multiple small bowel air-fluid levels. No free intraperitoneal air. The supine image demonstrates small bowel distension, example loop 3.8 cm. Compare 4.0 cm at the same level on the prior. small volume colonic gas. No pneumatosis. IMPRESSION: Similar partial small bowel obstruction. No free intraperitoneal air or other acute complication. Electronically Signed   By: Jeronimo Greaves M.D.   On: 01/17/2015 09:08       Scheduled Meds: . amLODipine  10 mg Per Tube Daily  . aspirin  81 mg Per Tube Daily  . cloNIDine  0.2 mg Transdermal Weekly  . cycloSPORINE  1 drop Both Eyes BID  . enalaprilat  1.25 mg Intravenous 4 times per day  . fluticasone  2 spray Each Nare Daily  . heparin  5,000 Units Subcutaneous 3 times per day  . hydrALAZINE  10 mg Intravenous Q6H  . Influenza vac split quadrivalent PF  0.5 mL Intramuscular Tomorrow-1000  . latanoprost  1 drop Both Eyes QHS  . levothyroxine  112.5 mcg Intravenous QAC breakfast  . lip balm   Topical BID  . metoprolol  5 mg Intravenous 4 times per day  . mometasone-formoterol  2 puff Inhalation BID  . nitroGLYCERIN  0.6 mg Transdermal Daily  . pantoprazole (PROTONIX) IV  40 mg Intravenous Q12H  . polyvinyl alcohol  1 drop Both Eyes QID  . sertraline  100 mg Per Tube Daily   Continuous Infusions: . dextrose 5 % and 0.9 % NaCl with KCl 20 mEq/L 75 mL/hr (01/17/15 1353)   Time spent: 25 min minutes.    Marlin Canary, DO [facility] Pager [phone]  If 7PM-7AM, please contact night-coverage [email or web address] Password [email or web address] 01/18/2015, 2:24 PM    LOS: 8 days

## 2015-01-18 NOTE — Progress Notes (Signed)
Physical Therapy Treatment Patient Details Name: Shannon [name] MRN: [id number] DOB: 12/08/1923 Today's Date: 01/18/2015    History of Present Illness 81 y.o. female admitted with SBO.  Pt with h/o gastric outlet obstruction, SBO, sequela of a perforated pyloric / duodenal ulcer and SIADH (syndrome of inappropriate ADH production)     PT Comments    Pt is limited today by high HR during gait 88-147 during short distance gait with RW.  O2 sats stable despite DOE 2/4- in the mid 90s on RA during gait.  Very little physical assistance needed to mobilize, but poor activity tolerance.  PT will continue to follow acutely to help progress mobility until d/c home.   Follow Up Recommendations  Home health PT;Supervision for mobility/OOB     Equipment Recommendations  None recommended by PT    Recommendations for Other Services   NA     Precautions / Restrictions Precautions Precautions: Fall    Mobility  Bed Mobility               General bed mobility comments: Pt in chair upon arrival  Transfers Overall transfer level: Needs assistance Equipment used: Rolling walker (2 wheeled) Transfers: Sit to/from Stand Sit to Stand: Min guard         General transfer comment: Min guard assist for safety  Ambulation/Gait Ambulation/Gait assistance: Min guard Ambulation Distance (Feet): 65 Feet Assistive device: Rolling walker (2 wheeled) Gait Pattern/deviations: Step-through pattern;Shuffle;Trunk flexed     General Gait Details: Gait was most limited by high HR during mobility. She started in the upper 80s, low 90s and max observed was 147 during short distance gait.  Pt with increased DOE during gait, however, O2 sats on RA were >90%          Balance Overall balance assessment: Needs assistance Sitting-balance support: Feet supported;No upper extremity supported Sitting balance-Leahy Scale: Good     Standing balance support: Bilateral upper extremity supported;No upper  extremity supported Standing balance-Leahy Scale: Poor Standing balance comment: needs external support, uses RW at baseline                    Cognition Arousal/Alertness: Awake/alert Behavior During Therapy: WFL for tasks assessed/performed Overall Cognitive Status: Within Functional Limits for tasks assessed                         General Comments General comments (skin integrity, edema, etc.): Pt's daughter present during session, wondering why we did not walk further, reported to pt's daughter that pt's HR was very high with just short distance gait and we did not want to over tax her heart.        Pertinent Vitals/Pain Pain Assessment: No/denies pain           PT Goals (current goals can now be found in the care plan section) Acute Rehab PT Goals Patient Stated Goal: none stated Progress towards PT goals: Progressing toward goals    Frequency  Min 3X/week    PT Plan Current plan remains appropriate       End of Session   Activity Tolerance: Treatment limited secondary to medical complications (Comment) (high HR during gait) Patient left: in chair;with call bell/phone within reach;with family/visitor present     Time: 0865-7846 PT Time Calculation (min) (ACUTE ONLY): 17 min  Charges:  $Gait Training: 8-22 mins  Rollene Rotunda Chea Malan, PT, DPT [phone]   01/18/2015, 5:11 PM

## 2015-01-18 NOTE — Progress Notes (Signed)
Patient ID: Shannon [name], female   DOB: May 04, 1923, 81 y.o.   MRN: [id number]    Subjective: Pt feels ok today.  Had increase in crampy abdominal pain after her suppository yesterday and got returned to NGT suction. She had a fair amount of bilious thick green drainage present.  She has not had any results with her suppositories.    Objective: Vital signs in last 24 hours: Temp:  [97.5 F (36.4 C)-98.2 F (36.8 C)] 98.2 F (36.8 C) 01/25/2023 0430) Pulse Rate:  [77-106] 85 2023-01-25 0430) Resp:  [18] 18 2023/01/25 0430) BP: (145-187)/(56-87) 154/56 mmHg 01/25/23 0430) SpO2:  [98 %-100 %] 99 % 01-25-23 0851) Last BM Date: 01/07/15  Intake/Output from previous day: 10/16 0701 2023-01-25 0700 In: 1634.2 [I.V.:1634.2] Out: 1150 [Emesis/NG output:1150] Intake/Output this shift: Total I/O In: 600 [P.O.:600] Out: 450 [Urine:450]  PE: Abd: soft, bilious output from NGT, hypoactive BS, NG  Lab Results:   Recent Labs  25-Jan-2015 0323  WBC 6.7  HGB 10.7*  HCT 31.4*  PLT 258   BMET  Recent Labs  01/25/15 0323  NA 138  K 3.6  CL 108  CO2 21*  GLUCOSE 144*  BUN 13  CREATININE 1.03*  CALCIUM 8.9   PT/INR No results for input(s): LABPROT, INR in the last 72 hours. CMP     Component Value Date/Time   NA 138 01-25-15 0323   NA 137 11/02/2014 1029   K 3.6 January 25, 2015 0323   CL 108 01-25-2015 0323   CO2 21* 2015/01/25 0323   GLUCOSE 144* 01/25/2015 0323   GLUCOSE 94 11/02/2014 1029   BUN 13 2015/01/25 0323   BUN 31* 11/02/2014 1029   CREATININE 1.03* 2015/01/25 0323   CREATININE 1.3* 05/06/2014   CALCIUM 8.9 2015/01/25 0323   PROT 7.6 01/10/2015 0131   PROT 6.8 11/02/2014 1029   ALBUMIN 4.0 01/10/2015 0131   ALBUMIN 4.1 11/02/2014 1029   AST 29 01/10/2015 0131   ALT 19 01/10/2015 0131   ALKPHOS 75 01/10/2015 0131   BILITOT 1.0 01/10/2015 0131   BILITOT 0.3 11/02/2014 1029   GFRNONAA 47* 01-25-15 0323   GFRAA 54* 25-Jan-2015 0323   Lipase     Component Value Date/Time   LIPASE 21* 01/10/2015 0131       Studies/Results: Dg Abd 2 Views  01/25/15  CLINICAL DATA:  Small bowel obstruction.  Enteric tube. EXAM: ABDOMEN - 2 VIEW COMPARISON:  Abdominal radiograph from 1 day prior. FINDINGS: Enteric tube loops in the proximal stomach and terminates in the body of the stomach. Surgical sutures are again seen in the left upper quadrant. Mildly to moderately dilated small bowel loops in the central and right abdomen measuring up to 4.4 cm diameter, not appreciably changed. Mild stool throughout the colon. No evidence of pneumatosis or pneumoperitoneum. IMPRESSION: 1. Enteric tube loops in the proximal stomach and terminates in the body of the stomach. 2. No appreciable change in mild-to-moderate small bowel dilatation, in keeping with persistent distal small bowel obstruction. Electronically Signed   By: Delbert Phenix M.D.   On: 2015/01/25 07:58   Dg Abd 2 Views  01/17/2015  CLINICAL DATA:  SBO; pt states she is feeling better today due to flatulence this morning EXAM: ABDOMEN - 2 VIEW COMPARISON:  01/15/2015 FINDINGS: Upright and supine views. The upright view demonstrates a nasogastric tube looped within the body of the stomach. Multiple leads and wires projecting over the abdomen. Multiple small bowel air-fluid levels. No free intraperitoneal air. The  supine image demonstrates small bowel distension, example loop 3.8 cm. Compare 4.0 cm at the same level on the prior. small volume colonic gas. No pneumatosis. IMPRESSION: Similar partial small bowel obstruction. No free intraperitoneal air or other acute complication. Electronically Signed   By: Jeronimo Greaves M.D.   On: 01/17/2015 09:08    Anti-infectives: Anti-infectives    Start     Dose/Rate Route Frequency Ordered Stop   01/10/15 2200  amoxicillin (AMOXIL) 250 MG/5ML suspension 250 mg  Status:  Discontinued     250 mg Oral Every 12 hours 01/10/15 1703 01/10/15 1704   01/10/15 2200  amoxicillin (AMOXIL) 250 MG/5ML  suspension 250 mg  Status:  Discontinued     250 mg Per Tube Every 12 hours 01/10/15 1704 01/10/15 1725   01/10/15 1730  amoxicillin (AMOXIL) 250 MG/5ML suspension 500 mg  Status:  Discontinued     500 mg Per Tube 3 times per day 01/10/15 1725 01/13/15 1333       Assessment/Plan   1. SBO -another long d/w patient and her daughter was present this time.  I discussed the type of surgery she would require if they wanted to pursue this route.  I also discussed the possible complications, such as wound infection, post op infection, SBR, anastomotic leak, EC fistula, PNA, VDRF, FTT, poor wound healing due to lack and poor nutrition, etc.  The patient would like to talk to her sons as well, but she seems to now be leaning toward wanting to have an operation vs palliative care.  If this is the case, i d/w them that she is going to nutritional be behind as we are now 9 days into this with no nutrition on board.  It is not indicated to start TNA at this point, truly, unless she were to choose and operation, as TNA is not indicated in her if she choose palliative care. -we will follow up with her later and see if she has made up her mind.  We likely could not operate on her until Wednesday at the earliest if they choose to go this route.  LOS: 8 days    Roselina Burgueno E 01/18/2015, 12:47 PM Pager: [phone]

## 2015-01-18 NOTE — Evaluation (Signed)
Occupational Therapy Evaluation Patient Details Name: Shannon [name] MRN: [id number] DOB: 03/29/1924 Today's Date: 01/18/2015    History of Present Illness 81 y.o. female admitted with SBO.  Pt with h/o gastric outlet obstruction, SBO, sequela of a perforated pyloric / duodenal ulcer and SIADH (syndrome of inappropriate ADH production)    Clinical Impression   Pt admitted to hospital due to reason stated above Pt currently with functional limitiations due to the deficits listed below (see OT problem list). Prior to admission pt was independent with ADLs. Pt currently requires set up to min assist for safety with ADLs. Pt's daughter reports she will be able to provide 24 hour assistance to pt upon discharge, however pt's daughter is requesting HHOT services to assist with more complex ADLs in the home environment. Pt will benefit from skilled OT to increase her independence and safety with ADLs and balance to allow safe discharge home.    Follow Up Recommendations  Home health OT    Equipment Recommendations  3 in 1 bedside comode    Recommendations for Other Services       Precautions / Restrictions Precautions Precautions: Fall Restrictions Weight Bearing Restrictions: No      Mobility Bed Mobility               General bed mobility comments: Pt in chair upon arrival  Transfers                      Balance Overall balance assessment: Needs assistance Sitting-balance support: No upper extremity supported;Feet supported Sitting balance-Leahy Scale: Good                                      ADL Overall ADL's : Needs assistance/impaired Eating/Feeding: Independent;Sitting   Grooming: Set up;Sitting   Upper Body Bathing: Minimal assitance;Sitting   Lower Body Bathing: Minimal assistance;Sitting/lateral leans;Sit to/from stand   Upper Body Dressing : Minimal assistance;Sitting   Lower Body Dressing: Minimal assistance;Sitting/lateral  leans;Sit to/from stand   Toilet Transfer: Min guard;Ambulation;RW;BSC             General ADL Comments: Pt demonstrated UE AROM for precursor to ADL participation.     Vision     Perception     Praxis      Pertinent Vitals/Pain Pain Assessment: No/denies pain     Hand Dominance Right   Extremity/Trunk Assessment Upper Extremity Assessment Upper Extremity Assessment: Overall WFL for tasks assessed   Lower Extremity Assessment Lower Extremity Assessment: Defer to PT evaluation   Cervical / Trunk Assessment Cervical / Trunk Assessment: Normal   Communication Communication Communication: No difficulties   Cognition Arousal/Alertness: Awake/alert Behavior During Therapy: WFL for tasks assessed/performed Overall Cognitive Status: Within Functional Limits for tasks assessed                     General Comments    OT evaluation limited due to pt's HR being 140bpm from previous PT session as well as pt's daughter reporting same regarding pt's HR. Pt demonstrated ADL activities in seated position to decrease risk of increasing pt's HR.    Exercises       Shoulder Instructions      Home Living Family/patient expects to be discharged to:: Private residence Living Arrangements: Children Available Help at Discharge: Family;Available 24 hours/day Type of Home: House Home Access: Ramped entrance     Home Layout:  One level     Bathroom Shower/Tub: Tub/shower unit Shower/tub characteristics: Engineer, building services: Standard Bathroom Accessibility: Yes How Accessible: Accessible via walker Home Equipment: Walker - 2 wheels;Walker - 4 wheels;Cane - single point;Shower seat;Hand held shower head;Adaptive equipment Adaptive Equipment: Reacher;Long-handled shoe horn        Prior Functioning/Environment Level of Independence: Independent        Comments: Per pt's daughter used rolling walker occassionally    OT Diagnosis: Generalized weakness;Acute  pain   OT Problem List: Decreased strength;Decreased activity tolerance;Impaired balance (sitting and/or standing);Decreased cognition;Decreased knowledge of use of DME or AE   OT Treatment/Interventions: Self-care/ADL training;Therapeutic exercise;DME and/or AE instruction;Therapeutic activities;Cognitive remediation/compensation;Patient/family education    OT Goals(Current goals can be found in the care plan section) Acute Rehab OT Goals Patient Stated Goal: none stated OT Goal Formulation: With patient Time For Goal Achievement: 02/01/15 Potential to Achieve Goals: Fair ADL Goals Pt Will Perform Grooming: with modified independence;standing Pt Will Perform Lower Body Bathing: with supervision;sitting/lateral leans;sit to/from stand Pt Will Perform Lower Body Dressing: with supervision;sitting/lateral leans;sit to/from stand Pt Will Transfer to Toilet: with supervision;ambulating;bedside commode Pt Will Perform Toileting - Clothing Manipulation and hygiene: with supervision;sitting/lateral leans;sit to/from stand Pt Will Perform Tub/Shower Transfer: with min guard assist;ambulating;rolling walker;shower seat Pt/caregiver will Perform Home Exercise Program: Increased strength;Both right and left upper extremity;With theraband;With Supervision  OT Frequency: Min 2X/week   Barriers to D/C:            Co-evaluation              End of Session    Activity Tolerance: Patient tolerated treatment well Patient left: in chair;with call bell/phone within reach;with family/visitor present   Time: 9147-8295 OT Time Calculation (min): 30 min Charges:  OT General Charges $OT Visit: 1 Procedure OT Evaluation $Initial OT Evaluation Tier I: 1 Procedure OT Treatments $Self Care/Home Management : 8-22 mins G-Codes:    Smiley Houseman 02-01-15, 2:04 PM

## 2015-01-19 ENCOUNTER — Inpatient Hospital Stay (HOSPITAL_COMMUNITY): Payer: Medicare Other

## 2015-01-19 LAB — GLUCOSE, CAPILLARY
Glucose-Capillary: 117 mg/dL — ABNORMAL HIGH (ref 65–99)
Glucose-Capillary: 130 mg/dL — ABNORMAL HIGH (ref 65–99)
Glucose-Capillary: 143 mg/dL — ABNORMAL HIGH (ref 65–99)
Glucose-Capillary: 146 mg/dL — ABNORMAL HIGH (ref 65–99)
Glucose-Capillary: 93 mg/dL (ref 65–99)
Glucose-Capillary: 99 mg/dL (ref 65–99)

## 2015-01-19 LAB — MAGNESIUM
Magnesium: 1.2 mg/dL — ABNORMAL LOW (ref 1.7–2.4)
Magnesium: 1.2 mg/dL — ABNORMAL LOW (ref 1.7–2.4)

## 2015-01-19 LAB — PHOSPHORUS: Phosphorus: 1.8 mg/dL — ABNORMAL LOW (ref 2.5–4.6)

## 2015-01-19 MED ORDER — INSULIN ASPART 100 UNIT/ML ~~LOC~~ SOLN
0.0000 [IU] | SUBCUTANEOUS | Status: DC
  Administered 2015-01-20 (×3): 2 [IU] via SUBCUTANEOUS
  Administered 2015-01-20 – 2015-01-21 (×2): 1 [IU] via SUBCUTANEOUS
  Administered 2015-01-21: 2 [IU] via SUBCUTANEOUS
  Administered 2015-01-21 (×3): 1 [IU] via SUBCUTANEOUS
  Administered 2015-01-22 (×2): 2 [IU] via SUBCUTANEOUS
  Administered 2015-01-22 (×3): 1 [IU] via SUBCUTANEOUS
  Administered 2015-01-22 – 2015-01-23 (×2): 2 [IU] via SUBCUTANEOUS
  Administered 2015-01-23 – 2015-01-24 (×4): 1 [IU] via SUBCUTANEOUS

## 2015-01-19 MED ORDER — KCL IN DEXTROSE-NACL 20-5-0.9 MEQ/L-%-% IV SOLN
INTRAVENOUS | Status: AC
  Filled 2015-01-19 (×2): qty 1000

## 2015-01-19 MED ORDER — FAT EMULSION 20 % IV EMUL
240.0000 mL | INTRAVENOUS | Status: AC
  Administered 2015-01-19: 240 mL via INTRAVENOUS
  Filled 2015-01-19: qty 250

## 2015-01-19 MED ORDER — TRACE MINERALS CR-CU-MN-SE-ZN 10-1000-500-60 MCG/ML IV SOLN
INTRAVENOUS | Status: AC
  Administered 2015-01-19: 18:00:00 via INTRAVENOUS
  Filled 2015-01-19: qty 720

## 2015-01-19 MED ORDER — POTASSIUM PHOSPHATES 15 MMOLE/5ML IV SOLN
30.0000 mmol | Freq: Once | INTRAVENOUS | Status: AC
  Administered 2015-01-19: 30 mmol via INTRAVENOUS
  Filled 2015-01-19: qty 10

## 2015-01-19 MED ORDER — HEPARIN SODIUM (PORCINE) 5000 UNIT/ML IJ SOLN
5000.0000 [IU] | Freq: Three times a day (TID) | INTRAMUSCULAR | Status: DC
  Administered 2015-01-19 – 2015-01-21 (×5): 5000 [IU] via SUBCUTANEOUS
  Filled 2015-01-19 (×6): qty 1

## 2015-01-19 MED ORDER — INSULIN ASPART 100 UNIT/ML ~~LOC~~ SOLN: 0.0000 [IU] | Freq: Four times a day (QID) | SUBCUTANEOUS | Status: DC

## 2015-01-19 MED ORDER — POTASSIUM CHLORIDE 10 MEQ/100ML IV SOLN
10.0000 meq | Freq: Once | INTRAVENOUS | Status: AC
  Administered 2015-01-19: 10 meq via INTRAVENOUS
  Filled 2015-01-19: qty 100

## 2015-01-19 MED ORDER — FAT EMULSION 20 % IV EMUL: 240.0000 mL | INTRAVENOUS | Status: DC

## 2015-01-19 MED ORDER — CLINIMIX E/DEXTROSE (5/15) 5 % IV SOLN: INTRAVENOUS | Status: DC

## 2015-01-19 MED ORDER — MAGNESIUM SULFATE 4 GM/100ML IV SOLN
4.0000 g | Freq: Once | INTRAVENOUS | Status: AC
  Administered 2015-01-19: 4 g via INTRAVENOUS
  Filled 2015-01-19: qty 100

## 2015-01-19 MED ORDER — CEFAZOLIN SODIUM-DEXTROSE 2-3 GM-% IV SOLR
2.0000 g | INTRAVENOUS | Status: AC
  Administered 2015-01-20: 2 g via INTRAVENOUS
  Filled 2015-01-19 (×2): qty 50

## 2015-01-19 MED ORDER — POTASSIUM CHLORIDE 10 MEQ/100ML IV SOLN
10.0000 meq | INTRAVENOUS | Status: AC
  Administered 2015-01-19 (×2): 10 meq via INTRAVENOUS
  Filled 2015-01-19 (×2): qty 100

## 2015-01-19 MED ORDER — SODIUM CHLORIDE 0.9 % IJ SOLN
10.0000 mL | INTRAMUSCULAR | Status: DC | PRN
  Administered 2015-01-20: 10 mL
  Administered 2015-01-25: 20 mL
  Administered 2015-01-26: 10 mL
  Administered 2015-01-26 (×2): 20 mL
  Filled 2015-01-19 (×5): qty 40

## 2015-01-19 NOTE — Progress Notes (Signed)
Utilization review completed.  

## 2015-01-19 NOTE — Progress Notes (Addendum)
PARENTERAL NUTRITION CONSULT NOTE - INITIAL  Pharmacy Consult for TPN Indication: SBO  No Known Allergies  Patient Measurements: Height: '5\' 6"'  (167.6 cm) (taken from 11/06/14) Weight: 127 lb 12.8 oz (57.97 kg) IBW/kg (Calculated) : 59.3  Vital Signs: Temp: 99.3 F (37.4 C) (10/18 0425) Temp Source: Oral (10/18 0425) BP: 155/80 mmHg (10/18 0538) Pulse Rate: 100 (10/18 0425) Intake/Output from previous day: 10/17 0701 - 10/18 0700 In: 2354 [P.O.:1180; I.V.:1174] Out: 2400 [Urine:1250; Emesis/NG output:1150] Intake/Output from this shift: Total I/O In: 0  Out: 200 [Urine:200]  Labs:  Recent Labs  01/18/15 0323  WBC 6.7  HGB 10.7*  HCT 31.4*  PLT 258     Recent Labs  01/18/15 0323  NA 138  K 3.6  CL 108  CO2 21*  GLUCOSE 144*  BUN 13  CREATININE 1.03*  CALCIUM 8.9   Estimated Creatinine Clearance: 33.9 mL/min (by C-G formula based on Cr of 1.03).    Recent Labs  01/19/15 0425 01/19/15 0741 01/19/15 1140  GLUCAP 130* 143* 99    Medical History: Past Medical History  Diagnosis Date  . Pneumonia   . Hyperlipidemia   . Hypertension   . Small bowel obstruction ([place])     "I don't know how many" (01/11/2015)  . Ventral hernia with bowel obstruction   . Asthma   . Hyperlipidemia   . Stroke ([place])     "light one"  . COPD (chronic obstructive pulmonary disease) ([place])   . Hypothyroidism   . Type II diabetes mellitus ([place])   . Anemia   . History of blood transfusion   . GERD (gastroesophageal reflux disease)   . History of stomach ulcers   . Arthritis     "all over"  . Renal insufficiency   . Depression     "light case"  . SIADH (syndrome of inappropriate ADH production) ([place])     [name] 01/10/2015    Insulin Requirements in the past 24 hours:  None  Assessment: 42 yof presenting with 4 day history of abdominal pain, progressively worsening, found to have functional gastric outlet obstruction. LBM on 10/8 pta, no flatus x 4 days pta on admit.  CT suggests SBO vs. Ileus. Pt repeatedly declining surgery and palliative consulted but family declined. NPO since 01/10/15. History of remote gastrojejunostomy & suspected vagotomy, s/p GJ anastomosis resection and GJ revision after perforation of anastomotic ulcer in 09/2013. Pharmacy consulted to start TPN for SBO. Would be Wednesday before surgery can be done if pt opts for that route  Surgeries/Procedures: 10/19: ex-lap scheduled  GI: high-grade SBO not resolving. Repeatedly declining surgery, palliative declined. Will be very high risk if decide on surgery - Plan ex-lap tomorrow per surgery note. NGT output 1350cc/24h. LBM 10/16. PPI  Endo: T2DM. Hypothyroid - synthroid. CBGs 99-143. No meds, no insulin pta  Lytes: K 4.3>>3.6 - K runs ordered x3 per MD, Phos low 1.8, Mg low 1.2  Renal: CKD3. SCr 0.91>>1.03, CrCl~33, UOP 0.63m/kg/h. D5NSw/20K'@75'   Pulm: Dulera  Cards: HTN. Amlodipine, asa81, vastotec, hydralazine, lopressor, clonidine  Hepatobil: LFTs/tbili/alk phos wnl on 10/9  Neuro: Hx CVA. sertraline  ID: Afeb, wbc wnl  Best Practices: hep [place]  TPN Access: To place PICC 10/18 TPN start date: 01/19/15>>  Current Nutrition:  None - NPO  Nutritional Goals:  1450 - 1740 kCal, 70 grams of protein per day  Plan:  - Initiate Clinimix E 5/15 @ 30 ml/h + 20% IVFE '@10'  ml/h - will start low and advance slowly  due to risk of refeeding - TPN will provide 991kcal + 36g protein daily - Initiate SSI q4h and monitor CBGs - KPhos 30 mmol x 1 - K runs x3 per MD - Mag sulfate 4g x 1 - TPN labs, dietician consult - Reduce IVF rate to 58m/h at 1800 when TPN bag starts - Advance TPN to goal rate as tolerated  HElicia Lamp PharmD Clinical Pharmacist Pager [phone]/18/2016 1:31 PM

## 2015-01-19 NOTE — Progress Notes (Signed)
PROGRESS NOTE    Shannon [name] ZOX:[id number] DOB: [birth year] DOA: 01/10/2015 PCP: Kirt Boys, DO  HPI/Brief narrative 81 y.o. female with history of remote gastrojejunostomy & suspected vagotomy, status post GJ anastomosis resection and GJ revision after perforation of anastomotic ulcer 09/2013, G00 01/2014 with EGD 2: #1 with no anatomic outlet obstruction and normal looking pylorus/duodenum, #2: To breakup bezoar, CT 03/12/14: Recurrent bezoar, thickened pyloric channel, EGD 03/19/14: Tortuous/narrowed anatomy at prepylorus/pylorus but no stricture or ulcer, UGI/SBFT 03/25/14: Normal, CT scan 05/21/14: Gastric distention, stomach filled with fluid/food and thickened pyloric wall, barium swallow 05/22/14: Small esophageal hernia, barium trickled into duodenum, eventually assessed to have functional gastric outlet obstruction (follows with [facility]) presented with 4 day history of progressively worsening abdominal pain. Last BM-small one on 10/8, no flatus since 4 days. In ED, CT abdomen suggestive of PSBO versus ileus. Multiple (3) attempts to place NG tube on floor unsuccessful. IR consulted for fluoroscopic guided NG tube placement. CCS consulted. She had teeth pulled on Wed this week, pain control was with Tylenol 3 but she stopped this after Wed. patient was treated conservatively with bowel rest, NG tube and IV fluids despite which her SBO has not resolved. Patient has repeatedly declined surgery. Palliative care was consulted but family have asked them not to see patient and they signed off 10/14. As per surgery, if SBO not resolving at some point soon, there needs to be decision about feeding etc.   Assessment/Plan:  SBO  - Patient has been treated conservatively since admission including bowel rest, NG tube, IV fluids, serial clinical and x-ray exams. Despite these measures, SBO has not resolved. Patient has repeatedly declined surgery in the past. - Palliative care was consulted: made  DNR. If SBO does not resolve, will have to revisit hospice with daughter. Patient reportedly open to the idea, but daughter was not, and patient deferred (per PMT). However on 10/14, daughter was upset with the palliative care team and have asked them not to see patient again as per patient's wishes apparently - If surgery is opted, she will be very high risk and will need to get CCM involved perioperatively & postop will need ICU care.  Essential hypertension  Difficult to control. On multiple pgt meds. Doubt GI absorption is reliable. Increased nitrodur.  IV vasotec. Cardiology has ordered clonidine TTS. Daughter reports it "burned her skin" and refuses placement. - Blood pressure control seems to have improved.  Type II DM No further lows. Reasonable inpatient control.  Hypothyroid - IV Synthroid  Dehydration with hyponatremia resolved  Stage III chronic kidney disease - Creatinine is at baseline  History of CVA - Continue aspirin  Hypokalemia corrected  Anemia - stable  Elevated troponin Echo without WMA. EF normal. Cardiology signed off. On ASA, beta blocker  Tachycardia: no definite fib/flutter. Continue metoprolol  Severe malnutrition in context of chronic illness NPO since admission 01/10/15   DVT prophylaxis: Subcutaneous heparin  Code Status: DO NOT RESUSCITATE. Family Communication: discussed with daughter [name]  10/17 Disposition Plan: Not medically stable for discharge   Consultants:  Gen. Surgery   Palliative-signed off 10/14  Cardiology-signed off  Procedures:  NG tube  2D Echo: Echo Left ventricle: The cavity size was normal. Systolic function was normal. The estimated ejection fraction was in the range of 55% to 60%. Wall motion was normal; there were no regional wall motion abnormalities. - Aortic valve: There was mild to moderate regurgitation. Regurgitation pressure half-time: 365 ms. - Left  atrium: The atrium  was severely dilated. - Right ventricle: The cavity size was normal. Wall thickness was normal. Systolic function was normal. - Tricuspid valve: There was mild regurgitation. - Pulmonary arteries: PA peak pressure: 42 mm Hg (S). - Inferior vena cava: The vessel was normal in size. The respirophasic diameter changes were in the normal range (>= 50%), consistent with normal central venous pressure.   Antibiotics:  None    Subjective: Feels better than yesterday +flatus +soft BMs  Objective: Filed Vitals:   01/18/15 2350 01/19/15 0425 01/19/15 0538 01/19/15 0903  BP: 144/69 155/80 155/80   Pulse:  100    Temp:  99.3 F (37.4 C)    TempSrc:  Oral    Resp:  19    Height:      Weight:      SpO2:  99%  99%    Intake/Output Summary (Last 24 hours) at 01/19/15 1232 Last data filed at 01/19/15 1027  Gross per 24 hour  Intake   1754 ml  Output   2150 ml  Net   -396 ml   Filed Weights   01/12/15 1123  Weight: 57.97 kg (127 lb 12.8 oz)     Exam:  General exam: frail appearing; NG tube in place Respiratory system: Clear to auscultation. No increased work of breathing. Cardiovascular system: S1 and S2 heard, RRR. No JVD or, murmurs or pedal edema Gastrointestinal system: Abdomen is nondistended, soft and nontender. Few bowel sounds heard. NGT+ Extremities: Symmetric 5 x 5 power. CNS: Alert and oriented. No focal deficits.   Data Reviewed: Basic Metabolic Panel:  Recent Labs Lab 01/13/15 0232 01/15/15 0400 01/18/15 0323  NA 139 145 138  K 4.1 4.3 3.6  CL 107 114* 108  CO2 19* 21* 21*  GLUCOSE 91 145* 144*  BUN 16 19 13   CREATININE 0.89 0.91 1.03*  CALCIUM 10.0 9.9 8.9   Liver Function Tests: No results for input(s): AST, ALT, ALKPHOS, BILITOT, PROT, ALBUMIN in the last 168 hours. No results for input(s): LIPASE, AMYLASE in the last 168 hours. No results for input(s): AMMONIA in the last 168 hours. CBC:  Recent Labs Lab 01/15/15 0400  01/18/15 0323  WBC 6.3 6.7  HGB 12.0 10.7*  HCT 34.1* 31.4*  MCV 93.7 93.7  PLT 280 258   Cardiac Enzymes:  Recent Labs Lab 01/12/15 1558 01/12/15 2102 01/13/15 0232  TROPONINI 0.42* 0.35* 0.24*   BNP (last 3 results) No results for input(s): PROBNP in the last 8760 hours. CBG:  Recent Labs Lab 01/18/15 1948 01/18/15 2359 01/19/15 0425 01/19/15 0741 01/19/15 1140  GLUCAP 138* 146* 130* 143* 99    No results found for this or any previous visit (from the past 240 hour(s)).       Studies: Dg Abd 2 Views  01/18/2015  CLINICAL DATA:  Small bowel obstruction.  Enteric tube. EXAM: ABDOMEN - 2 VIEW COMPARISON:  Abdominal radiograph from 1 day prior. FINDINGS: Enteric tube loops in the proximal stomach and terminates in the body of the stomach. Surgical sutures are again seen in the left upper quadrant. Mildly to moderately dilated small bowel loops in the central and right abdomen measuring up to 4.4 cm diameter, not appreciably changed. Mild stool throughout the colon. No evidence of pneumatosis or pneumoperitoneum. IMPRESSION: 1. Enteric tube loops in the proximal stomach and terminates in the body of the stomach. 2. No appreciable change in mild-to-moderate small bowel dilatation, in keeping with persistent distal small bowel obstruction. Electronically  Signed   By: Delbert Phenix M.D.   On: 01/18/2015 07:58   Dg Abd Portable 1v  01/19/2015  CLINICAL DATA:  Small bowel obstruction EXAM: PORTABLE ABDOMEN - 1 VIEW COMPARISON:  01/18/2015 FINDINGS: Disproportionally dilated loops of bowel are stable. There is some colonic gas. Stable NG tube in the body of the stomach. IMPRESSION: Stable partial small bowel obstruction pattern. Electronically Signed   By: Jolaine Click M.D.   On: 01/19/2015 09:32       Scheduled Meds: . amLODipine  10 mg Per Tube Daily  . aspirin  81 mg Per Tube Daily  . cloNIDine  0.2 mg Transdermal Weekly  . cycloSPORINE  1 drop Both Eyes BID  .  enalaprilat  1.25 mg Intravenous 4 times per day  . fluticasone  2 spray Each Nare Daily  . heparin  5,000 Units Subcutaneous 3 times per day  . hydrALAZINE  10 mg Intravenous Q6H  . Influenza vac split quadrivalent PF  0.5 mL Intramuscular Tomorrow-1000  . latanoprost  1 drop Both Eyes QHS  . levothyroxine  112.5 mcg Intravenous QAC breakfast  . lip balm   Topical BID  . metoprolol  5 mg Intravenous 4 times per day  . mometasone-formoterol  2 puff Inhalation BID  . nitroGLYCERIN  0.6 mg Transdermal Daily  . pantoprazole (PROTONIX) IV  40 mg Intravenous Q12H  . polyvinyl alcohol  1 drop Both Eyes QID  . potassium chloride  10 mEq Intravenous Q1 Hr x 3  . sertraline  100 mg Per Tube Daily   Continuous Infusions: . dextrose 5 % and 0.9 % NaCl with KCl 20 mEq/L 75 mL/hr at 01/18/15 2350   Time spent: 25 min minutes.    Marlin Canary, DO [facility] Pager [phone]  If 7PM-7AM, please contact night-coverage [email or web address] Password [email or web address] 01/19/2015, 12:32 PM    LOS: 9 days

## 2015-01-19 NOTE — Progress Notes (Signed)
Nutrition Follow-up  DOCUMENTATION CODES:   Severe malnutrition in context of chronic illness  INTERVENTION:   -TPN management per pharmacy -Recommend check Mg, Phos, and K daily x 3 days, due to high risk for developing refeeding syndrome  NUTRITION DIAGNOSIS:   Inadequate oral intake related to altered GI function as evidenced by NPO status.  Ongoing  GOAL:   Patient will meet greater than or equal to 90% of their needs  Unmet  MONITOR:   Labs, Weight trends, Skin, I & O's, Diet advancement  REASON FOR ASSESSMENT:   Consult New TPN/TNA  ASSESSMENT:   Shannon [name] is a 81 y.o. female with h/o gastric outlet obstruction, SBO, sequela of a perforated pyloric / duodenal ulcer that she had during summer 2015. Patient presents to the ED with c/o gradually worsening, generalized abdominal pain. Symptoms onset 1 day ago. No BM since Thursday at home and unable to pass gas either. She had teeth pulled on Wed this week, pain control was with Tylenol 3 but she stopped this after Wed.  Pt remains with NGT for low, intermittent suction. Noted 750 ml output with the past 24 hours per doc flowsheets.   Pt remains NPO. Reviewed surgical notes from today. Pt and family have agreed to pursue ex-lap, scheduled for tomorrow. Films continue to show persistent SBO.   Plan is to place PICC today and start TPN, due to inadequate nutrition since admission. Reviewed pharmacy note; plan to initiate Clinimix E 5/15 @ 30 ml/h + 20% IVFE @10  ml/h, which provides 991 kcals and 36 grams protein (66% of estimated kcal needs and 48% of estimated protein needs). Plan to increase low and slow due to high risk of refeeding syndrome.   Labs reviewed: Phos: 1.8, Mg: 1.2.   Diet Order:  TPN (CLINIMIX-E) Adult  Skin:  Reviewed, no issues  Last BM:  01/19/15  Height:   Ht Readings from Last 1 Encounters:  01/12/15 5\' 6"  (1.676 m)    Weight:   Wt Readings from Last 1 Encounters:  01/12/15 127 lb  12.8 oz (57.97 kg)    Ideal Body Weight:  59.1 kg  BMI:  Body mass index is 20.64 kg/(m^2).  Estimated Nutritional Needs:   Kcal:  1500-1700  Protein:  75-90 grams  Fluid:  1.5-1.7 L  EDUCATION NEEDS:   No education needs identified at this time  Field Staniszewski A. Jimmye Norman, RD, LDN, CDE Pager: [phone] After hours Pager: [phone]

## 2015-01-19 NOTE — Progress Notes (Signed)
Peripherally Inserted Central Catheter/Midline Placement  The IV Nurse has discussed with the patient and/or persons authorized to consent for the patient, the purpose of this procedure and the potential benefits and risks involved with this procedure.  The benefits include less needle sticks, lab draws from the catheter and patient may be discharged home with the catheter.  Risks include, but not limited to, infection, bleeding, blood clot (thrombus formation), and puncture of an artery; nerve damage and irregular heat beat.  Alternatives to this procedure were also discussed.  PICC/Midline Placement Documentation        Shannon [name] 01/19/2015, 5:53 PM

## 2015-01-19 NOTE — Progress Notes (Signed)
Patient ID: Shannon [name], female   DOB: [birth year], 81 y.o.   MRN: [id number]    Subjective: Pt feels about the same today.  Had 2 other softer BMs this morning.  Objective: Vital signs in last 24 hours: Temp:  [97.4 F (36.3 C)-99.3 F (37.4 C)] 98.5 F (36.9 C) (10/18 1410) Pulse Rate:  [70-119] 70 (10/18 1410) Resp:  [16-19] 19 (10/18 1410) BP: (141-164)/(68-93) 146/93 mmHg (10/18 1410) SpO2:  [99 %] 99 % (10/18 1410) Last BM Date: 01/17/15  Intake/Output from previous day: 02/14/2023 0701 - 10/18 0700 In: 2354 [P.O.:1180; I.V.:1174] Out: 2400 [Urine:1250; Emesis/NG output:1150] Intake/Output this shift: Total I/O In: 0  Out: 200 [Urine:200]  PE: Abd: soft, NT, ND, NGT still with some bilious output  Lab Results:   Recent Labs  02-14-15 0323  WBC 6.7  HGB 10.7*  HCT 31.4*  PLT 258   BMET  Recent Labs  2015-02-14 0323  NA 138  K 3.6  CL 108  CO2 21*  GLUCOSE 144*  BUN 13  CREATININE 1.03*  CALCIUM 8.9   PT/INR No results for input(s): LABPROT, INR in the last 72 hours. CMP     Component Value Date/Time   NA 138 02/14/2015 0323   NA 137 11/02/2014 1029   K 3.6 02-14-2015 0323   CL 108 02/14/2015 0323   CO2 21* Feb 14, 2015 0323   GLUCOSE 144* 02/14/15 0323   GLUCOSE 94 11/02/2014 1029   BUN 13 February 14, 2015 0323   BUN 31* 11/02/2014 1029   CREATININE 1.03* 2015-02-14 0323   CREATININE 1.3* 05/06/2014   CALCIUM 8.9 02-14-15 0323   PROT 7.6 01/10/2015 0131   PROT 6.8 11/02/2014 1029   ALBUMIN 4.0 01/10/2015 0131   ALBUMIN 4.1 11/02/2014 1029   AST 29 01/10/2015 0131   ALT 19 01/10/2015 0131   ALKPHOS 75 01/10/2015 0131   BILITOT 1.0 01/10/2015 0131   BILITOT 0.3 11/02/2014 1029   GFRNONAA 47* February 14, 2015 0323   GFRAA 54* 14-Feb-2015 0323   Lipase     Component Value Date/Time   LIPASE 21* 01/10/2015 0131       Studies/Results: Dg Abd 2 Views  02/14/15  CLINICAL DATA:  Small bowel obstruction.  Enteric tube. EXAM: ABDOMEN - 2 VIEW  COMPARISON:  Abdominal radiograph from 1 day prior. FINDINGS: Enteric tube loops in the proximal stomach and terminates in the body of the stomach. Surgical sutures are again seen in the left upper quadrant. Mildly to moderately dilated small bowel loops in the central and right abdomen measuring up to 4.4 cm diameter, not appreciably changed. Mild stool throughout the colon. No evidence of pneumatosis or pneumoperitoneum. IMPRESSION: 1. Enteric tube loops in the proximal stomach and terminates in the body of the stomach. 2. No appreciable change in mild-to-moderate small bowel dilatation, in keeping with persistent distal small bowel obstruction. Electronically Signed   By: Delbert Phenix M.D.   On: 02-14-15 07:58   Dg Abd Portable 1v  01/19/2015  CLINICAL DATA:  Small bowel obstruction EXAM: PORTABLE ABDOMEN - 1 VIEW COMPARISON:  02-14-15 FINDINGS: Disproportionally dilated loops of bowel are stable. There is some colonic gas. Stable NG tube in the body of the stomach. IMPRESSION: Stable partial small bowel obstruction pattern. Electronically Signed   By: Jolaine Click M.D.   On: 01/19/2015 09:32    Anti-infectives: Anti-infectives    Start     Dose/Rate Route Frequency Ordered Stop   01/20/15 0600  ceFAZolin (ANCEF) IVPB 2 g/50 mL premix  2 g 100 mL/hr over 30 Minutes Intravenous On call to O.R. 01/19/15 1251 01/21/15 0559   01/10/15 2200  amoxicillin (AMOXIL) 250 MG/5ML suspension 250 mg  Status:  Discontinued     250 mg Oral Every 12 hours 01/10/15 1703 01/10/15 1704   01/10/15 2200  amoxicillin (AMOXIL) 250 MG/5ML suspension 250 mg  Status:  Discontinued     250 mg Per Tube Every 12 hours 01/10/15 1704 01/10/15 1725   01/10/15 1730  amoxicillin (AMOXIL) 250 MG/5ML suspension 500 mg  Status:  Discontinued     500 mg Per Tube 3 times per day 01/10/15 1725 01/13/15 1333       Assessment/Plan  1. SBO -despite BMs, which I think are residual from her colon, her films this morning still  show a persistent SBO type pattern.  I think she likely has a high grade psbo.  After lengthy discussions the patient and family have chosen to proceed with surgical intervention.  We will place a PICC and start her on TNA.  Will plan for OR tomorrow for surgical intervention. 2. PCM -will start TNA today  LOS: 9 days    Krue Peterka E 01/19/2015, 2:22 PM Pager: [phone]

## 2015-01-20 ENCOUNTER — Inpatient Hospital Stay (HOSPITAL_COMMUNITY): Payer: Medicare Other | Admitting: Certified Registered"

## 2015-01-20 ENCOUNTER — Encounter (HOSPITAL_COMMUNITY)
Admission: EM | Disposition: A | Discharge status: Home Health | Payer: Self-pay | Source: Home / Self Care | Attending: Internal Medicine

## 2015-01-20 HISTORY — PX: LYSIS OF ADHESION: SHX5961

## 2015-01-20 HISTORY — PX: LAPAROTOMY: SHX154

## 2015-01-20 LAB — COMPREHENSIVE METABOLIC PANEL
ALT: 25 U/L (ref 14–54)
AST: 29 U/L (ref 15–41)
Albumin: 2.7 g/dL — ABNORMAL LOW (ref 3.5–5.0)
Alkaline Phosphatase: 53 U/L (ref 38–126)
Anion gap: 9 (ref 5–15)
BUN: 8 mg/dL (ref 6–20)
CO2: 21 mmol/L — ABNORMAL LOW (ref 22–32)
Calcium: 8.6 mg/dL — ABNORMAL LOW (ref 8.9–10.3)
Chloride: 102 mmol/L (ref 101–111)
Creatinine, Ser: 0.92 mg/dL (ref 0.44–1.00)
GFR calc Af Amer: >60 mL/min (ref >60)
GFR calc non Af Amer: 54 mL/min — ABNORMAL LOW (ref >60)
Glucose, Bld: 165 mg/dL — ABNORMAL HIGH (ref 65–99)
Potassium: 3.9 mmol/L (ref 3.5–5.1)
Sodium: 132 mmol/L — ABNORMAL LOW (ref 135–145)
Total Bilirubin: 0.4 mg/dL (ref 0.3–1.2)
Total Protein: 5.6 g/dL — ABNORMAL LOW (ref 6.5–8.1)

## 2015-01-20 LAB — PHOSPHORUS: Phosphorus: 5.8 mg/dL — ABNORMAL HIGH (ref 2.5–4.6)

## 2015-01-20 LAB — DIFFERENTIAL
Basophils Absolute: 0 10*3/uL (ref 0.0–0.1)
Basophils Relative: 0 %
Eosinophils Absolute: 0.5 10*3/uL (ref 0.0–0.7)
Eosinophils Relative: 7 %
Lymphocytes Relative: 12 %
Lymphs Abs: 0.8 10*3/uL (ref 0.7–4.0)
Monocytes Absolute: 0.9 10*3/uL (ref 0.1–1.0)
Monocytes Relative: 14 %
Neutro Abs: 4.4 10*3/uL (ref 1.7–7.7)
Neutrophils Relative %: 67 %

## 2015-01-20 LAB — PROTIME-INR
INR: 1.11 (ref 0.00–1.49)
Prothrombin Time: 14.5 seconds (ref 11.6–15.2)

## 2015-01-20 LAB — CBC
HCT: 30.6 % — ABNORMAL LOW (ref 36.0–46.0)
Hemoglobin: 10.5 g/dL — ABNORMAL LOW (ref 12.0–15.0)
MCH: 31.8 pg (ref 26.0–34.0)
MCHC: 34.3 g/dL (ref 30.0–36.0)
MCV: 92.7 fL (ref 78.0–100.0)
Platelets: 256 10*3/uL (ref 150–400)
RBC: 3.3 MIL/uL — ABNORMAL LOW (ref 3.87–5.11)
RDW: 14.5 % (ref 11.5–15.5)
WBC: 6.7 10*3/uL (ref 4.0–10.5)

## 2015-01-20 LAB — TRIGLYCERIDES: Triglycerides: 91 mg/dL (ref <150)

## 2015-01-20 LAB — GLUCOSE, CAPILLARY
Glucose-Capillary: 129 mg/dL — ABNORMAL HIGH (ref 65–99)
Glucose-Capillary: 139 mg/dL — ABNORMAL HIGH (ref 65–99)
Glucose-Capillary: 161 mg/dL — ABNORMAL HIGH (ref 65–99)
Glucose-Capillary: 164 mg/dL — ABNORMAL HIGH (ref 65–99)
Glucose-Capillary: 167 mg/dL — ABNORMAL HIGH (ref 65–99)

## 2015-01-20 LAB — PREALBUMIN: Prealbumin: 12.4 mg/dL — ABNORMAL LOW (ref 18–38)

## 2015-01-20 LAB — MAGNESIUM: Magnesium: 2 mg/dL (ref 1.7–2.4)

## 2015-01-20 SURGERY — LAPAROTOMY, EXPLORATORY
Anesthesia: Epidural | Site: Abdomen

## 2015-01-20 MED ORDER — BUPIVACAINE HCL (PF) 0.25 % IJ SOLN
INTRAMUSCULAR | Status: DC | PRN
  Administered 2015-01-20 (×2): 3 mL via EPIDURAL

## 2015-01-20 MED ORDER — PHENYLEPHRINE HCL 10 MG/ML IJ SOLN
INTRAMUSCULAR | Status: DC | PRN
  Administered 2015-01-20 (×3): 40 ug via INTRAVENOUS

## 2015-01-20 MED ORDER — DEXTROSE 5 % IV SOLN
0.0000 ug/min | INTRAVENOUS | Status: DC
  Filled 2015-01-20: qty 1

## 2015-01-20 MED ORDER — LACTATED RINGERS IV SOLN
INTRAVENOUS | Status: DC | PRN
  Administered 2015-01-20: 11:00:00 via INTRAVENOUS

## 2015-01-20 MED ORDER — ROPIVACAINE HCL 2 MG/ML IJ SOLN
10.0000 mL/h | INTRAMUSCULAR | Status: DC
  Administered 2015-01-20: 5 mL/h via EPIDURAL
  Administered 2015-01-21: 8 mL/h via EPIDURAL
  Administered 2015-01-22: 10 mL/h via EPIDURAL
  Administered 2015-01-23: 8 mL/h via EPIDURAL
  Filled 2015-01-20 (×4): qty 200

## 2015-01-20 MED ORDER — PROMETHAZINE HCL 25 MG/ML IJ SOLN: 6.2500 mg | INTRAMUSCULAR | Status: DC | PRN

## 2015-01-20 MED ORDER — KCL IN DEXTROSE-NACL 20-5-0.9 MEQ/L-%-% IV SOLN
INTRAVENOUS | Status: DC
  Administered 2015-01-21: via INTRAVENOUS
  Filled 2015-01-20: qty 1000

## 2015-01-20 MED ORDER — LIDOCAINE-EPINEPHRINE (PF) 1.5 %-1:200000 IJ SOLN
INTRAMUSCULAR | Status: DC | PRN
  Administered 2015-01-20 (×3): 3 mL via PERINEURAL
  Administered 2015-01-20: 5 mL via PERINEURAL

## 2015-01-20 MED ORDER — LIDOCAINE HCL (CARDIAC) 20 MG/ML IV SOLN
INTRAVENOUS | Status: DC | PRN
  Administered 2015-01-20: 50 mg via INTRAVENOUS

## 2015-01-20 MED ORDER — SUCCINYLCHOLINE CHLORIDE 20 MG/ML IJ SOLN
INTRAMUSCULAR | Status: DC | PRN
  Administered 2015-01-20: 100 mg via INTRAVENOUS

## 2015-01-20 MED ORDER — FENTANYL CITRATE (PF) 100 MCG/2ML IJ SOLN
INTRAMUSCULAR | Status: AC
  Filled 2015-01-20: qty 2

## 2015-01-20 MED ORDER — HYDROMORPHONE HCL 1 MG/ML IJ SOLN
INTRAMUSCULAR | Status: AC
  Filled 2015-01-20: qty 1

## 2015-01-20 MED ORDER — TRACE MINERALS CR-CU-MN-SE-ZN 10-1000-500-60 MCG/ML IV SOLN
INTRAVENOUS | Status: AC
  Administered 2015-01-20: 18:00:00 via INTRAVENOUS
  Filled 2015-01-20: qty 1200

## 2015-01-20 MED ORDER — FENTANYL CITRATE (PF) 250 MCG/5ML IJ SOLN
INTRAMUSCULAR | Status: AC
  Filled 2015-01-20: qty 5

## 2015-01-20 MED ORDER — MORPHINE SULFATE (PF) 2 MG/ML IV SOLN
2.0000 mg | INTRAVENOUS | Status: DC | PRN
  Administered 2015-01-21 (×3): 2 mg via INTRAVENOUS
  Filled 2015-01-20 (×3): qty 1

## 2015-01-20 MED ORDER — ALBUMIN HUMAN 5 % IV SOLN
25.0000 g | Freq: Once | INTRAVENOUS | Status: AC
  Administered 2015-01-20: 25 g via INTRAVENOUS

## 2015-01-20 MED ORDER — EPHEDRINE SULFATE 50 MG/ML IJ SOLN
INTRAMUSCULAR | Status: DC | PRN
  Administered 2015-01-20 (×4): 5 mg via INTRAVENOUS

## 2015-01-20 MED ORDER — PROPOFOL 10 MG/ML IV BOLUS
INTRAVENOUS | Status: AC
  Filled 2015-01-20: qty 20

## 2015-01-20 MED ORDER — ROCURONIUM BROMIDE 100 MG/10ML IV SOLN
INTRAVENOUS | Status: DC | PRN
  Administered 2015-01-20: 10 mg via INTRAVENOUS

## 2015-01-20 MED ORDER — LACTATED RINGERS IV SOLN: INTRAVENOUS | Status: DC

## 2015-01-20 MED ORDER — SODIUM CHLORIDE 0.9 % IR SOLN
Status: DC | PRN
  Administered 2015-01-20 (×2): 1000 mL

## 2015-01-20 MED ORDER — HYDROMORPHONE HCL 1 MG/ML IJ SOLN
0.2500 mg | INTRAMUSCULAR | Status: DC | PRN
  Administered 2015-01-20 (×3): 0.5 mg via INTRAVENOUS

## 2015-01-20 MED ORDER — MEPERIDINE HCL 25 MG/ML IJ SOLN: 6.2500 mg | INTRAMUSCULAR | Status: DC | PRN

## 2015-01-20 MED ORDER — GLYCOPYRROLATE 0.2 MG/ML IJ SOLN
INTRAMUSCULAR | Status: DC | PRN
  Administered 2015-01-20: 0.4 mg via INTRAVENOUS

## 2015-01-20 MED ORDER — LACTATED RINGERS IV SOLN
INTRAVENOUS | Status: DC
  Administered 2015-01-20: 11:00:00 via INTRAVENOUS

## 2015-01-20 MED ORDER — PROPOFOL 10 MG/ML IV BOLUS
INTRAVENOUS | Status: DC | PRN
  Administered 2015-01-20: 100 mg via INTRAVENOUS

## 2015-01-20 MED ORDER — FAT EMULSION 20 % IV EMUL
240.0000 mL | INTRAVENOUS | Status: AC
  Administered 2015-01-20: 240 mL via INTRAVENOUS
  Filled 2015-01-20: qty 250

## 2015-01-20 MED ORDER — ALBUMIN HUMAN 5 % IV SOLN
INTRAVENOUS | Status: AC
  Filled 2015-01-20: qty 250

## 2015-01-20 MED ORDER — NEOSTIGMINE METHYLSULFATE 10 MG/10ML IV SOLN
INTRAVENOUS | Status: DC | PRN
  Administered 2015-01-20: 3 mg via INTRAVENOUS

## 2015-01-20 MED ORDER — ARTIFICIAL TEARS OP OINT
TOPICAL_OINTMENT | OPHTHALMIC | Status: DC | PRN
  Administered 2015-01-20: 1 via OPHTHALMIC

## 2015-01-20 MED ORDER — ONDANSETRON HCL 4 MG/2ML IJ SOLN
INTRAMUSCULAR | Status: DC | PRN
  Administered 2015-01-20: 4 mg via INTRAVENOUS

## 2015-01-20 MED ORDER — DEXTROSE 5 % IV SOLN
10.0000 mg | INTRAVENOUS | Status: DC | PRN
  Administered 2015-01-20: 50 ug/min via INTRAVENOUS

## 2015-01-20 SURGICAL SUPPLY — 58 items
APPLIER CLIP 5 13 M/L LIGAMAX5 (MISCELLANEOUS)
BLADE SURG ROTATE 9660 (MISCELLANEOUS) ×3 IMPLANT
CANISTER SUCTION 2500CC (MISCELLANEOUS) ×3 IMPLANT
CHLORAPREP W/TINT 26ML (MISCELLANEOUS) ×3 IMPLANT
CLIP APPLIE 5 13 M/L LIGAMAX5 (MISCELLANEOUS) IMPLANT
COVER MAYO STAND STRL (DRAPES) IMPLANT
COVER SURGICAL LIGHT HANDLE (MISCELLANEOUS) ×3 IMPLANT
DECANTER SPIKE VIAL GLASS SM (MISCELLANEOUS) IMPLANT
DRAPE C-ARM 42X72 X-RAY (DRAPES) IMPLANT
DRAPE LAPAROSCOPIC ABDOMINAL (DRAPES) ×3 IMPLANT
DRAPE PROXIMA HALF (DRAPES) IMPLANT
DRAPE UTILITY XL STRL (DRAPES) ×6 IMPLANT
DRAPE WARM FLUID 44X44 (DRAPE) ×3 IMPLANT
DRSG OPSITE POSTOP 4X10 (GAUZE/BANDAGES/DRESSINGS) IMPLANT
DRSG OPSITE POSTOP 4X8 (GAUZE/BANDAGES/DRESSINGS) IMPLANT
DRSG PAD ABDOMINAL 8X10 ST (GAUZE/BANDAGES/DRESSINGS) ×3 IMPLANT
ELECT BLADE 6.5 EXT (BLADE) IMPLANT
ELECT CAUTERY BLADE 6.4 (BLADE) ×3 IMPLANT
ELECT REM PT RETURN 9FT ADLT (ELECTROSURGICAL) ×6
ELECTRODE REM PT RTRN 9FT ADLT (ELECTROSURGICAL) ×4 IMPLANT
GAUZE SPONGE 4X4 12PLY STRL (GAUZE/BANDAGES/DRESSINGS) ×3 IMPLANT
GLOVE SURG SIGNA 7.5 PF LTX (GLOVE) ×6 IMPLANT
GOWN STRL REUS W/ TWL LRG LVL3 (GOWN DISPOSABLE) ×4 IMPLANT
GOWN STRL REUS W/ TWL XL LVL3 (GOWN DISPOSABLE) ×2 IMPLANT
GOWN STRL REUS W/TWL LRG LVL3 (GOWN DISPOSABLE) ×2
GOWN STRL REUS W/TWL XL LVL3 (GOWN DISPOSABLE) ×1
KIT BASIN OR (CUSTOM PROCEDURE TRAY) ×6 IMPLANT
KIT ROOM TURNOVER OR (KITS) ×6 IMPLANT
LIGASURE IMPACT 36 18CM CVD LR (INSTRUMENTS) IMPLANT
LIQUID BAND (GAUZE/BANDAGES/DRESSINGS) IMPLANT
NS IRRIG 1000ML POUR BTL (IV SOLUTION) ×6 IMPLANT
PACK GENERAL/GYN (CUSTOM PROCEDURE TRAY) ×3 IMPLANT
PAD ARMBOARD 7.5X6 YLW CONV (MISCELLANEOUS) ×3 IMPLANT
PENCIL BUTTON HOLSTER BLD 10FT (ELECTRODE) IMPLANT
SCISSORS LAP 5X35 DISP (ENDOMECHANICALS) IMPLANT
SET IRRIG TUBING LAPAROSCOPIC (IRRIGATION / IRRIGATOR) IMPLANT
SLEEVE ENDOPATH XCEL 5M (ENDOMECHANICALS) IMPLANT
SPECIMEN JAR LARGE (MISCELLANEOUS) IMPLANT
SPONGE LAP 18X18 X RAY DECT (DISPOSABLE) IMPLANT
STAPLER VISISTAT 35W (STAPLE) ×3 IMPLANT
SUCTION POOLE TIP (SUCTIONS) ×3 IMPLANT
SUT MON AB 4-0 PC3 18 (SUTURE) IMPLANT
SUT PDS AB 1 TP1 96 (SUTURE) ×6 IMPLANT
SUT SILK 2 0 SH CR/8 (SUTURE) ×3 IMPLANT
SUT SILK 2 0 TIES 10X30 (SUTURE) ×3 IMPLANT
SUT SILK 3 0 SH CR/8 (SUTURE) ×3 IMPLANT
SUT SILK 3 0 TIES 10X30 (SUTURE) ×3 IMPLANT
SUT VIC AB 3-0 SH 18 (SUTURE) IMPLANT
TAPE CLOTH SURG 6X10 WHT LF (GAUZE/BANDAGES/DRESSINGS) ×3 IMPLANT
TOWEL OR 17X24 6PK STRL BLUE (TOWEL DISPOSABLE) ×3 IMPLANT
TOWEL OR 17X26 10 PK STRL BLUE (TOWEL DISPOSABLE) ×3 IMPLANT
TRAY FOLEY CATH 16FRSI W/METER (SET/KITS/TRAYS/PACK) ×3 IMPLANT
TRAY LAPAROSCOPIC MC (CUSTOM PROCEDURE TRAY) IMPLANT
TROCAR XCEL NON-BLD 11X100MML (ENDOMECHANICALS) IMPLANT
TROCAR XCEL NON-BLD 5MMX100MML (ENDOMECHANICALS) IMPLANT
TUBE CONNECTING 12X1/4 (SUCTIONS) IMPLANT
TUBING INSUFFLATION (TUBING) IMPLANT
YANKAUER SUCT BULB TIP NO VENT (SUCTIONS) ×3 IMPLANT

## 2015-01-20 NOTE — Anesthesia Procedure Notes (Addendum)
Procedure Name: Intubation Date/Time: 01/20/2015 11:42 AM Performed by: Gaylene Brooks Pre-anesthesia Checklist: Patient identified, Timeout performed, Emergency Drugs available, Suction available and Patient being monitored Patient Re-evaluated:Patient Re-evaluated prior to inductionOxygen Delivery Method: Circle system utilized Preoxygenation: Pre-oxygenation with 100% oxygen Intubation Type: IV induction, Rapid sequence and Cricoid Pressure applied Laryngoscope Size: Miller and 2 Grade View: Grade I Tube type: Oral Tube size: 7.0 mm Number of attempts: 1 Airway Equipment and Method: Stylet Placement Confirmation: ETT inserted through vocal cords under direct vision,  breath sounds checked- equal and bilateral,  positive ETCO2 and CO2 detector Secured at: 21 cm Tube secured with: Tape Dental Injury: Teeth and Oropharynx as per pre-operative assessment    Epidural   Staffing Anesthesiologist: Nolon Nations Performed by: anesthesiologist   Preanesthetic Checklist Completed: patient identified, surgical consent, pre-op evaluation, timeout performed, IV checked, risks and benefits discussed and monitors and equipment checked  Epidural Prep: ChloraPrep and site prepped and draped Patient monitoring: heart rate, cardiac monitor, continuous pulse ox and blood pressure Approach: right paramedian Location: thoracic (1-12) Injection technique: LOR air and LOR saline  Needle:  Needle type: Tuohy  Needle gauge: 17 G Needle length: 9 cm Needle insertion depth: 5 cm Catheter type: closed end flexible Catheter size: 19 Gauge Catheter at skin depth: 12 cm Test dose: negative and 1.5% lidocaine with Epi 1:200 K  Assessment Events: blood not aspirated, injection not painful, no injection resistance, negative IV test and no paresthesia

## 2015-01-20 NOTE — Progress Notes (Signed)
PARENTERAL NUTRITION CONSULT NOTE - INITIAL  Pharmacy Consult for TPN Indication: SBO  No Known Allergies  Patient Measurements: Height: '5\' 6"'  (167.6 cm) (taken from 11/06/14) Weight: 127 lb 12.8 oz (57.97 kg) IBW/kg (Calculated) : 59.3  Vital Signs: Temp: 97.7 F (36.5 C) (10/19 0532) Temp Source: Oral (10/19 0532) BP: 180/74 mmHg (10/19 0532) Pulse Rate: 111 (10/19 0532) Intake/Output from previous day: 10/18 0701 - 10/19 0700 In: 1082.8 [I.V.:574.8; TPN:508] Out: 850 [Urine:200; Emesis/NG output:650] Intake/Output from this shift: Total I/O In: 145 [I.V.:45; NG/GT:60; TPN:40] Out: 150 [Urine:150]  Labs:  Recent Labs  01/18/15 0323 01/20/15 0600  WBC 6.7 6.7  HGB 10.7* 10.5*  HCT 31.4* 30.6*  PLT 258 256     Recent Labs  01/18/15 0323 01/19/15 1418 01/19/15 1458 01/20/15 0445 01/20/15 0600  NA 138  --   --   --  132*  K 3.6  --   --   --  3.9  CL 108  --   --   --  102  CO2 21*  --   --   --  21*  GLUCOSE 144*  --   --   --  165*  BUN 13  --   --   --  8  CREATININE 1.03*  --   --   --  0.92  CALCIUM 8.9  --   --   --  8.6*  MG  --  1.2* 1.2*  --  2.0  PHOS  --  1.8*  --   --  5.8*  PROT  --   --   --   --  5.6*  ALBUMIN  --   --   --   --  2.7*  AST  --   --   --   --  29  ALT  --   --   --   --  25  ALKPHOS  --   --   --   --  53  BILITOT  --   --   --   --  0.4  PREALBUMIN  --   --   --  12.4*  --    Estimated Creatinine Clearance: 38 mL/min (by C-G formula based on Cr of 0.92).    Recent Labs  01/20/15 0036 01/20/15 0342 01/20/15 0735  GLUCAP 161* 167* 129*    Medical History: Past Medical History  Diagnosis Date  . Pneumonia   . Hyperlipidemia   . Hypertension   . Small bowel obstruction ([place])     "I don't know how many" (01/11/2015)  . Ventral hernia with bowel obstruction   . Asthma   . Hyperlipidemia   . Stroke ([place])     "light one"  . COPD (chronic obstructive pulmonary disease) ([place])   . Hypothyroidism   . Type II  diabetes mellitus ([place])   . Anemia   . History of blood transfusion   . GERD (gastroesophageal reflux disease)   . History of stomach ulcers   . Arthritis     "all over"  . Renal insufficiency   . Depression     "light case"  . SIADH (syndrome of inappropriate ADH production) ([place])     [name] 01/10/2015    Insulin Requirements in the past 24 hours:  5u on SSI q4h  Assessment: 68 yof with hx of gastric outlet obstruction, SBO, sequela of perforated pyloric/duodenal ulcer in 2015. Presenting with 4 day history of abdominal pain, progressively worsening,LBM on 10/8 pta,  no flatus x 4 days pta on admit. CT suggests SBO vs. Ileus. Pt repeatedly declining surgery and palliative consulted but family declined. NPO since 01/10/15. Pharmacy consulted to start TPN for pSBO. Scheduled for surgery on 10/19. Severe malnutrition in setting of chronic illness noted in RD assessment. Pt at high risk of refeeding syndrome.  Surgeries/Procedures: 10/19: ex-lap scheduled  GI: Prealbumin 12.4. high-grade SBO not resolving. Repeatedly declining surgery, palliative declined. Will be very high risk if decide on surgery - Plan ex-lap today. NGT output improved 550cc/24h. LBM 10/18. PPI  Endo: T2DM. Hypothyroid - synthroid. CBGs 93-165 on SSI. No meds, no insulin pta  Lytes: K 3.6>>3.9 - s/p 2 k runs (3 ordered), Phos w/ large increase 1.8>>5.8 (s/p KPhos 57mol x1), Mg up 1.2>>2 s/p Mag Sulf 4g x1, CoCa 9.4; Ca x Phos = 54  Renal: CKD3. SCr 1.03>>0.92, CrCl~38, UOP 0.157mkg/h per documentation. D5NSw/20K'@45'   Pulm: Dulera  Cards: HTN. Amlodipine, asa81, vastotec, hydralazine, lopressor, clonidine  Hepatobil: LFTs/tbili/alk phos wnl, alb 2.7  Neuro: Hx CVA. sertraline  ID: Afeb, wbc wnl  Best Practices: hep [place]  TPN Access: PICC 10/18>> TPN start date: 01/19/15>>  Current Nutrition:  Clinimix E 5/15 '@30'  ml/h + 20% IVFE '@10'  ml/h - provides 991kcal + 36g protein daily  Nutritional Goals: per RD  assessment 10/18 1500-1700 kCal, 75-90 grams of protein per day  Plan:  - Increase Clinimix E 5/15 @ 50 ml/h + 20% IVFE '@10'  ml/h - will start low and advance slowly due to risk of refeeding - Add multivitamin + trace elements to TPN bag - TPN will provide 1332kcal + 60g protein daily - SSI q4h and monitor CBGs - TPN labs in AM - Re-draw Phos today - Reduce IVF rate to 2535m at 1800 when TPN bag starts - Advance TPN to goal rate as tolerated - F/u pt's clinical picture post-surgery today  HalElicia LampharmD Clinical Pharmacist Pager [phone]/19/2016 9:15 AM

## 2015-01-20 NOTE — Op Note (Signed)
NAMEGREYSEN, Shannon [name]  MEDICAL RECORD NO.:  [id number]  LOCATION:  3S15C                        FACILITY:  [facility]  PHYSICIAN:  Coralie Keens, M.D. DATE OF BIRTH:  1923/12/14  DATE OF PROCEDURE:  01/20/2015 DATE OF DISCHARGE:                              OPERATIVE REPORT   PREOPERATIVE DIAGNOSIS:  SMALL BOWEL OBSTRUCTION.  POSTOPERATIVE DIAGNOSIS:  SMALL BOWEL OBSTRUCTION.  PROCEDURE: 1. EXPLORATORY LAPAROTOMY. 2. LYSIS OF ADHESIONS.  SURGEON:  Coralie Keens, M.D.  ASSISTANTJudyann Munson, RNFA.  ANESTHESIA:  GENERAL ENDOTRACHEAL ANESTHESIA.  ESTIMATED BLOOD LOSS:  Minimal.  INDICATIONS:  This is an 81 year old female, who has had multiple abdominal procedures.  She now presents with a persistent high-grade partial small bowel obstruction.  Decision was made to proceed to the operating room for exploratory laparotomy, after she failed conservative management.  FINDINGS:  The patient was found to have adhesions throughout the small bowel.  She had 1 area of dense adhesions creating an almost near complete small bowel obstruction.  There was mild stricturing of the bowel, which improved significantly after lysis of the adhesion.  PROCEDURE IN DETAIL:  The patient was brought to the operating room, identified as Shannon [name].  She was placed supine on the operating table and general anesthesia was induced.  Her abdomen was then prepped and draped in usual sterile fashion.  I created a midline incision through previous midline scar with a scalpel and took this down through the fascia with electrocautery.  I then opened the fascia, the entire length of the incision.  The patient had adhesions of omentum and the colon to the abdominal wall.  I did make a slight serosal tear in the transverse colon.  It was controlled with a silk suture.  The patient had multiple adhesions throughout the entire length of the small bowel.  I had  lysed adhesions throughout the small bowel with the Metzenbaum scissors.  I identified an area of stricturing, creating a near complete small bowel obstruction.  Once I lysed these adhesion, the stricture appeared to free up and I was able to easily milk the contents of the dilated bowel through this and through the collapsed distal small bowel.  I was unable to free up all the rest of the adhesions.  She almost had the appearance of an internal hernia as well from the adhesions.  I then ran the small bowel from ligament of Treitz to the terminal ileum and completely removed all visible adhesions.  At this point, this completely relieved the bowel obstruction.  I felt no other intraabdominal abnormalities. The abdomen was then irrigated with normal saline.  I then closed the patient's midline fascia with a running #1 looped PDS suture.  The skin was then closed with skin staples.  The patient tolerated the procedure well.  All the counts were correct at the end of procedure.  The patient was then extubated in the operating room and taken in stable condition to recovery room.     Coralie Keens, M.D.     DB/MEDQ  D:  01/20/2015  T:  01/20/2015  Job:  [id number]

## 2015-01-20 NOTE — Progress Notes (Signed)
PROGRESS NOTE    Shannon [name] [id number] DOB: 1923-10-08 DOA: 01/10/2015 PCP: Kirt Boys, DO  HPI/Brief narrative 81 y.o. female with history of remote gastrojejunostomy & suspected vagotomy, status post GJ anastomosis resection and GJ revision after perforation of anastomotic ulcer 09/2013, G00 01/2014 with EGD 2: #1 with no anatomic outlet obstruction and normal looking pylorus/duodenum, #2: To breakup bezoar, CT 03/12/14: Recurrent bezoar, thickened pyloric channel, EGD 03/19/14: Tortuous/narrowed anatomy at prepylorus/pylorus but no stricture or ulcer, UGI/SBFT 03/25/14: Normal, CT scan 05/21/14: Gastric distention, stomach filled with fluid/food and thickened pyloric wall, barium swallow 05/22/14: Small esophageal hernia, barium trickled into duodenum, eventually assessed to have functional gastric outlet obstruction (follows with [facility]) presented with 4 day history of progressively worsening abdominal pain. Last BM-small one on 10/8, no flatus since 4 days. In ED, CT abdomen suggestive of PSBO versus ileus. Multiple (3) attempts to place NG tube on floor unsuccessful. IR consulted for fluoroscopic guided NG tube placement. CCS consulted. She had teeth pulled on Wed this week, pain control was with Tylenol 3 but she stopped this after Wed. patient was treated conservatively with bowel rest, NG tube and IV fluids despite which her SBO has not resolved. Patient has repeatedly declined surgery. Palliative care was consulted but family have asked them not to see patient and they signed off 10/14. Patient going for surgery 10/19  Assessment/Plan:  SBO  - Patient has been treated conservatively since admission including bowel rest, NG tube, IV fluids, serial clinical and x-ray exams. Despite these measures, SBO has not resolved. Patient has repeatedly declined surgery in the past. - Palliative care was consulted: made DNR. If SBO does not resolve, will have to revisit hospice with daughter.  Patient reportedly open to the idea, but daughter was not, and patient deferred (per PMT). However on 10/14, daughter was upset with the palliative care team and have asked them not to see patient again as per patient's wishes apparently -patient opted for surgery 10/19  Essential hypertension  Difficult to control. On multiple pgt meds. Doubt GI absorption is reliable. Increased nitrodur.  IV vasotec. Cardiology has ordered clonidine TTS. Daughter reports it "burned her skin" and refuses placement. - Blood pressure control seems to have improved.  Type II DM No further lows. Reasonable inpatient control.  Hypothyroid - IV Synthroid  Dehydration with hyponatremia resolved  Stage III chronic kidney disease - Creatinine is at baseline  History of CVA - Continue aspirin  Hypokalemia corrected  Anemia - stable  Elevated troponin Echo without WMA. EF normal. Cardiology signed off. On ASA, beta blocker  Tachycardia: no definite fib/flutter. Continue metoprolol  Severe malnutrition in context of chronic illness NPO since admission 01/10/15   DVT prophylaxis: Subcutaneous heparin  Code Status: DO NOT RESUSCITATE. Family Communication: discussed with daughter [name]  10/19 Disposition Plan:    Consultants:  Gen. Surgery   Palliative-signed off 10/14  Cardiology-signed off  Procedures:  NG tube  2D Echo: Echo Left ventricle: The cavity size was normal. Systolic function was normal. The estimated ejection fraction was in the range of 55% to 60%. Wall motion was normal; there were no regional wall motion abnormalities. - Aortic valve: There was mild to moderate regurgitation. Regurgitation pressure half-time: 365 ms. - Left atrium: The atrium was severely dilated. - Right ventricle: The cavity size was normal. Wall thickness was normal. Systolic function was normal. - Tricuspid valve: There was mild regurgitation. - Pulmonary arteries: PA  peak pressure: 42 mm Hg (  S). - Inferior vena cava: The vessel was normal in size. The respirophasic diameter changes were in the normal range (>= 50%), consistent with normal central venous pressure.   Antibiotics:  None    Subjective: Anxious about surgery Nose and throat hurt  Objective: Filed Vitals:   01/19/15 1959 01/19/15 2226 01/19/15 2346 01/20/15 0532  BP:  153/59 157/68 180/74  Pulse:  73  111  Temp:  97.7 F (36.5 C)  97.7 F (36.5 C)  TempSrc:  Oral  Oral  Resp:  18  18  Height:      Weight:      SpO2: 98% 100%  100%    Intake/Output Summary (Last 24 hours) at 01/20/15 1046 Last data filed at 01/20/15 0800  Gross per 24 hour  Intake 1227.75 ml  Output    800 ml  Net 427.75 ml   Filed Weights   01/12/15 1123  Weight: 57.97 kg (127 lb 12.8 oz)     Exam:  General exam: frail appearing; NG tube in place Respiratory system: Clear to auscultation. No increased work of breathing. Cardiovascular system: S1 and S2 heard, RRR. No JVD or, murmurs or pedal edema Gastrointestinal system: Abdomen is nondistended, soft and nontender. Few bowel sounds heard. NGT+ Extremities: Symmetric 5 x 5 power. CNS: Alert and oriented. No focal deficits.   Data Reviewed: Basic Metabolic Panel:  Recent Labs Lab 01/15/15 0400 01/18/15 0323 01/19/15 1418 01/19/15 1458 01/20/15 0600  NA 145 138  --   --  132*  K 4.3 3.6  --   --  3.9  CL 114* 108  --   --  102  CO2 21* 21*  --   --  21*  GLUCOSE 145* 144*  --   --  165*  BUN 19 13  --   --  8  CREATININE 0.91 1.03*  --   --  0.92  CALCIUM 9.9 8.9  --   --  8.6*  MG  --   --  1.2* 1.2* 2.0  PHOS  --   --  1.8*  --  5.8*   Liver Function Tests:  Recent Labs Lab 01/20/15 0600  AST 29  ALT 25  ALKPHOS 53  BILITOT 0.4  PROT 5.6*  ALBUMIN 2.7*   No results for input(s): LIPASE, AMYLASE in the last 168 hours. No results for input(s): AMMONIA in the last 168 hours. CBC:  Recent Labs Lab 01/15/15 0400  01/18/15 0323 01/20/15 0600  WBC 6.3 6.7 6.7  NEUTROABS  --   --  4.4  HGB 12.0 10.7* 10.5*  HCT 34.1* 31.4* 30.6*  MCV 93.7 93.7 92.7  PLT 280 258 256   Cardiac Enzymes: No results for input(s): CKTOTAL, CKMB, CKMBINDEX, TROPONINI in the last 168 hours. BNP (last 3 results) No results for input(s): PROBNP in the last 8760 hours. CBG:  Recent Labs Lab 01/19/15 1553 01/19/15 2024 01/20/15 0036 01/20/15 0342 01/20/15 0735  GLUCAP 93 117* 161* 167* 129*    No results found for this or any previous visit (from the past 240 hour(s)).       Studies: Dg Abd Portable 1v  01/19/2015  CLINICAL DATA:  Small bowel obstruction EXAM: PORTABLE ABDOMEN - 1 VIEW COMPARISON:  01/18/2015 FINDINGS: Disproportionally dilated loops of bowel are stable. There is some colonic gas. Stable NG tube in the body of the stomach. IMPRESSION: Stable partial small bowel obstruction pattern. Electronically Signed   By: Jolaine Click M.D.   On:  01/19/2015 09:32       Scheduled Meds: . [MAR Hold] amLODipine  10 mg Per Tube Daily  . [MAR Hold] aspirin  81 mg Per Tube Daily  .  ceFAZolin (ANCEF) IV  2 g Intravenous On Call to OR  . [MAR Hold] cloNIDine  0.2 mg Transdermal Weekly  . [MAR Hold] cycloSPORINE  1 drop Both Eyes BID  . [MAR Hold] enalaprilat  1.25 mg Intravenous 4 times per day  . [MAR Hold] fluticasone  2 spray Each Nare Daily  . [MAR Hold] heparin  5,000 Units Subcutaneous 3 times per day  . [MAR Hold] hydrALAZINE  10 mg Intravenous Q6H  . [MAR Hold] Influenza vac split quadrivalent PF  0.5 mL Intramuscular Tomorrow-1000  . [MAR Hold] insulin aspart  0-9 Units Subcutaneous 6 times per day  . [MAR Hold] latanoprost  1 drop Both Eyes QHS  . [MAR Hold] levothyroxine  112.5 mcg Intravenous QAC breakfast  . [MAR Hold] lip balm   Topical BID  . [MAR Hold] metoprolol  5 mg Intravenous 4 times per day  . [MAR Hold] mometasone-formoterol  2 puff Inhalation BID  . [MAR Hold] nitroGLYCERIN  0.6  mg Transdermal Daily  . [MAR Hold] pantoprazole (PROTONIX) IV  40 mg Intravenous Q12H  . [MAR Hold] polyvinyl alcohol  1 drop Both Eyes QID  . [MAR Hold] sertraline  100 mg Per Tube Daily   Continuous Infusions: . dextrose 5 % and 0.9 % NaCl with KCl 20 mEq/L 45 mL/hr at 01/19/15 1827  . dextrose 5 % and 0.9 % NaCl with KCl 20 mEq/L    . Marland KitchenTPN (CLINIMIX-E) Adult 30 mL/hr at 01/19/15 1818   And  . fat emulsion 240 mL (01/19/15 1818)  . Marland KitchenTPN (CLINIMIX-E) Adult     And  . fat emulsion    . lactated ringers    . lactated ringers 50 mL/hr at 01/20/15 1037   Time spent: 25 min minutes.    Marlin Canary, DO [facility] Pager [phone]  If 7PM-7AM, please contact night-coverage [email or web address] Password [email or web address] 01/20/2015, 10:46 AM    LOS: 10 days

## 2015-01-20 NOTE — Transfer of Care (Signed)
Immediate Anesthesia Transfer of Care Note  Patient: Shannon [name]  Procedure(s) Performed: Procedure(s): EXPLORATORY LAPAROTOMY (N/A) LYSIS OF ADHESIONS < 1 HOUR (N/A)  Patient Location: PACU  Anesthesia Type:General and Epidural  Level of Consciousness: awake, alert  and oriented  Airway & Oxygen Therapy: Patient Spontanous Breathing and Patient connected to nasal cannula oxygen  Post-op Assessment: Report given to RN, Post -op Vital signs reviewed and stable and Patient moving all extremities X 4  Post vital signs: Reviewed and stable  Last Vitals:  Filed Vitals:   01/20/15 0532  BP: 180/74  Pulse: 111  Temp: 36.5 C  Resp: 18    Complications: No apparent anesthesia complications

## 2015-01-20 NOTE — Progress Notes (Signed)
PT Cancellation Note  Patient Details Name: Shannon [name] MRN: [id number] DOB: March 24, 1924   Cancelled Treatment:    Reason Eval/Treat Not Completed: Patient at procedure or test/unavailable (Pt in [place])   [place] 01/20/2015, 10:17 AM Suanne Marker PT [phone]

## 2015-01-20 NOTE — Anesthesia Preprocedure Evaluation (Addendum)
Anesthesia Evaluation  Patient identified by MRN, date of birth, ID band Patient awake    Reviewed: Allergy & Precautions, H&P , NPO status , Patient's Chart, lab work & pertinent test results, reviewed documented beta blocker date and time   History of Anesthesia Complications Negative for: history of anesthetic complications  Airway Mallampati: III  TM Distance: >3 FB Neck ROM: Full    Dental no notable dental hx. (+) Poor Dentition, Missing   Pulmonary asthma , pneumonia, COPD,    Pulmonary exam normal breath sounds clear to auscultation       Cardiovascular hypertension, Pt. on medications and Pt. on home beta blockers + CAD, + Cardiac Stents and + Peripheral Vascular Disease  Normal cardiovascular exam+ Valvular Problems/Murmurs AI  Rhythm:Regular Rate:Normal  Echo 01/2015 - Left ventricle: The cavity size was normal. Systolic function wasnormal. The estimated ejection fraction was in the range of 55%to 60%. Wall motion was normal; there were no regional wallmotion abnormalities. - Aortic valve: There was mild to moderate regurgitation. Regurgitation pressure half-time: 365 ms. - Left atrium: The atrium was severely dilated. - Right ventricle: The cavity size was normal. Wall thickness wasnormal. Systolic function was normal. - Tricuspid valve: There was mild regurgitation. - Pulmonary arteries: PA peak pressure: 42 mm Hg (S). - Inferior vena cava: The vessel was normal in size. The respirophasic diameter changes were in the normal range (>= 50%),consistent with normal central venous pressure.   Neuro/Psych PSYCHIATRIC DISORDERS Anxiety Depression CVA (L sided weakness), Residual Symptoms    GI/Hepatic Neg liver ROS, PUD, GERD  Medicated and Controlled,  Endo/Other  negative endocrine ROSdiabetes, Type 2Hypothyroidism   Renal/GU CRFRenal disease     Musculoskeletal negative musculoskeletal ROS (+) Arthritis  ,   Abdominal   Peds  Hematology  (+) anemia ,   Anesthesia Other Findings   Reproductive/Obstetrics negative OB ROS                            Anesthesia Physical  Anesthesia Plan  ASA: IV and emergent  Anesthesia Plan: General and Epidural   Post-op Pain Management:    Induction: Intravenous  Airway Management Planned: Oral ETT  Additional Equipment:   Intra-op Plan:   Post-operative Plan: Extubation in OR  Informed Consent: I have reviewed the patients History and Physical, chart, labs and discussed the procedure including the risks, benefits and alternatives for the proposed anesthesia with the patient or authorized representative who has indicated his/her understanding and acceptance.   Dental advisory given  Plan Discussed with: CRNA  Anesthesia Plan Comments:        Anesthesia Quick Evaluation

## 2015-01-20 NOTE — Op Note (Signed)
EXPLORATORY LAPAROTOMY, LYSIS OF ADHESIONS < 1 HOUR  Procedure Note  Miasia Headman 01/10/2015 - 01/20/2015   Pre-op Diagnosis: SMALL BOWEL OBSTRUCTION     Post-op Diagnosis: same  Procedure(s): EXPLORATORY LAPAROTOMY LYSIS OF ADHESIONS  Surgeon(s): Coralie Keens, MD  Anesthesia: General  Staff:  Circulator: Harrel Lemon, RN; Cyd Silence, RN Scrub Person: Jenelle Mages Panchit Circulator Assistant: Cyd Silence, RN RN First Assistant: Cyd Silence, RN  Estimated Blood Loss: Minimal               Findings:  SBO from adhesions          Kayln Garceau A   Date: 01/20/2015  Time: 12:38 PM

## 2015-01-20 NOTE — Progress Notes (Signed)
OT Cancellation Note  Patient Details Name: Shannon [name] MRN: [id number] DOB: 02-Dec-1923   Cancelled Treatment:    Reason Eval/Treat Not Completed: Patient at procedure or test/ unavailable   [name] 01/20/2015, 10:32 AM  Pager: [phone]

## 2015-01-21 ENCOUNTER — Encounter (HOSPITAL_COMMUNITY): Payer: Self-pay | Admitting: Surgery

## 2015-01-21 DIAGNOSIS — R001 Bradycardia, unspecified: Secondary | ICD-10-CM | POA: Diagnosis not present

## 2015-01-21 DIAGNOSIS — I952 Hypotension due to drugs: Secondary | ICD-10-CM | POA: Diagnosis not present

## 2015-01-21 LAB — COMPREHENSIVE METABOLIC PANEL
ALT: 78 U/L — ABNORMAL HIGH (ref 14–54)
AST: 125 U/L — ABNORMAL HIGH (ref 15–41)
Albumin: 2.6 g/dL — ABNORMAL LOW (ref 3.5–5.0)
Alkaline Phosphatase: 108 U/L (ref 38–126)
Anion gap: 7 (ref 5–15)
BUN: 19 mg/dL (ref 6–20)
CO2: 21 mmol/L — ABNORMAL LOW (ref 22–32)
Calcium: 8.8 mg/dL — ABNORMAL LOW (ref 8.9–10.3)
Chloride: 105 mmol/L (ref 101–111)
Creatinine, Ser: 1.17 mg/dL — ABNORMAL HIGH (ref 0.44–1.00)
GFR calc Af Amer: 46 mL/min — ABNORMAL LOW (ref >60)
GFR calc non Af Amer: 40 mL/min — ABNORMAL LOW (ref >60)
Glucose, Bld: 157 mg/dL — ABNORMAL HIGH (ref 65–99)
Potassium: 4 mmol/L (ref 3.5–5.1)
Sodium: 133 mmol/L — ABNORMAL LOW (ref 135–145)
Total Bilirubin: 0.7 mg/dL (ref 0.3–1.2)
Total Protein: 4.9 g/dL — ABNORMAL LOW (ref 6.5–8.1)

## 2015-01-21 LAB — GLUCOSE, CAPILLARY
Glucose-Capillary: 189 mg/dL — ABNORMAL HIGH (ref 65–99)
Glucose-Capillary: 173 mg/dL — ABNORMAL HIGH (ref 65–99)
Glucose-Capillary: 146 mg/dL — ABNORMAL HIGH (ref 65–99)
Glucose-Capillary: 135 mg/dL — ABNORMAL HIGH (ref 65–99)
Glucose-Capillary: 139 mg/dL — ABNORMAL HIGH (ref 65–99)
Glucose-Capillary: 143 mg/dL — ABNORMAL HIGH (ref 65–99)

## 2015-01-21 LAB — CBC
HCT: 25.3 % — ABNORMAL LOW (ref 36.0–46.0)
Hemoglobin: 8.8 g/dL — ABNORMAL LOW (ref 12.0–15.0)
MCH: 32.5 pg (ref 26.0–34.0)
MCHC: 34.8 g/dL (ref 30.0–36.0)
MCV: 93.4 fL (ref 78.0–100.0)
Platelets: 182 10*3/uL (ref 150–400)
RBC: 2.71 MIL/uL — ABNORMAL LOW (ref 3.87–5.11)
RDW: 14.8 % (ref 11.5–15.5)
WBC: 13 10*3/uL — ABNORMAL HIGH (ref 4.0–10.5)

## 2015-01-21 LAB — PHOSPHORUS: Phosphorus: 3.5 mg/dL (ref 2.5–4.6)

## 2015-01-21 LAB — MAGNESIUM: Magnesium: 1.6 mg/dL — ABNORMAL LOW (ref 1.7–2.4)

## 2015-01-21 MED ORDER — TRACE MINERALS CR-CU-MN-SE-ZN 10-1000-500-60 MCG/ML IV SOLN
INTRAVENOUS | Status: AC
  Administered 2015-01-21: 18:00:00 via INTRAVENOUS
  Filled 2015-01-21: qty 1680

## 2015-01-21 MED ORDER — MAGNESIUM SULFATE 2 GM/50ML IV SOLN
2.0000 g | Freq: Once | INTRAVENOUS | Status: AC
  Administered 2015-01-21: 2 g via INTRAVENOUS
  Filled 2015-01-21: qty 50

## 2015-01-21 MED ORDER — MORPHINE SULFATE (PF) 2 MG/ML IV SOLN
1.0000 mg | INTRAVENOUS | Status: DC | PRN
  Administered 2015-01-21 – 2015-01-22 (×3): 1 mg via INTRAVENOUS
  Administered 2015-01-22: 2 mg via INTRAVENOUS
  Administered 2015-01-22: 1 mg via INTRAVENOUS
  Administered 2015-01-23 – 2015-01-25 (×5): 2 mg via INTRAVENOUS
  Filled 2015-01-21 (×10): qty 1

## 2015-01-21 MED ORDER — SODIUM CHLORIDE 0.9 % IV BOLUS (SEPSIS)
500.0000 mL | Freq: Once | INTRAVENOUS | Status: AC
  Administered 2015-01-21: 500 mL via INTRAVENOUS

## 2015-01-21 MED ORDER — FAT EMULSION 20 % IV EMUL
240.0000 mL | INTRAVENOUS | Status: AC
  Administered 2015-01-21: 240 mL via INTRAVENOUS
  Filled 2015-01-21: qty 250

## 2015-01-21 MED ORDER — KCL IN DEXTROSE-NACL 20-5-0.9 MEQ/L-%-% IV SOLN
INTRAVENOUS | Status: DC
  Administered 2015-01-24: 06:00:00 via INTRAVENOUS
  Filled 2015-01-21 (×6): qty 1000

## 2015-01-21 NOTE — Progress Notes (Signed)
NGT restarted on LIWS.

## 2015-01-21 NOTE — Care Management Important Message (Signed)
Important Message  Patient Details  Name: Shannon [name] MRN: [id number] Date of Birth: May 07, 1923   Medicare Important Message Given:  Yes-fourth notification given    [name] May 01/21/2015, 1:38 PM

## 2015-01-21 NOTE — Progress Notes (Signed)
PT Cancellation Note  Patient Details Name: Shannon [name] MRN: [id number] DOB: 1923-12-14   Cancelled Treatment:    Reason Eval/Treat Not Completed: Medical issues which prohibited therapy (PT brady w/ HR down to 35bpm and BP 63/33).  PT will attempt session again tomorrow as appropriate.    Joslyn Hy PT, DPT [phone] Pager: [phone] 01/21/2015, 11:05 AM

## 2015-01-21 NOTE — Progress Notes (Signed)
PARENTERAL NUTRITION CONSULT NOTE - INITIAL  Pharmacy Consult for TPN Indication: SBO  No Known Allergies  Patient Measurements: Height: '5\' 6"'  (167.6 cm) (taken from 11/06/14) Weight: 127 lb 12.8 oz (57.97 kg) IBW/kg (Calculated) : 59.3  Vital Signs: Temp: 98.9 F (37.2 C) (10/20 0728) Temp Source: Oral (10/20 0728) BP: 117/36 mmHg (10/20 0728) Pulse Rate: 72 (10/20 0728) Intake/Output from previous day: 10/19 0701 - 10/20 0700 In: 1550 [I.V.:495; NG/GT:235; TPN:760] Out: 800 [Urine:700; Emesis/NG output:100] Intake/Output from this shift: Total I/O In: 90 [I.V.:25; Other:5; TPN:60] Out: -   Labs:  Recent Labs  01/20/15 0600 01/20/15 1000  WBC 6.7  --   HGB 10.5*  --   HCT 30.6*  --   PLT 256  --   INR  --  1.11     Recent Labs  01/19/15 1418 01/19/15 1458 01/20/15 0445 01/20/15 0600 01/21/15 0415  NA  --   --   --  132* 133*  K  --   --   --  3.9 4.0  CL  --   --   --  102 105  CO2  --   --   --  21* 21*  GLUCOSE  --   --   --  165* 157*  BUN  --   --   --  8 19  CREATININE  --   --   --  0.92 1.17*  CALCIUM  --   --   --  8.6* 8.8*  MG 1.2* 1.2*  --  2.0 1.6*  PHOS 1.8*  --   --  5.8* 3.5  PROT  --   --   --  5.6* 4.9*  ALBUMIN  --   --   --  2.7* 2.6*  AST  --   --   --  29 125*  ALT  --   --   --  25 78*  ALKPHOS  --   --   --  53 108  BILITOT  --   --   --  0.4 0.7  PREALBUMIN  --   --  12.4*  --   --   TRIG  --   --   --  91  --    Estimated Creatinine Clearance: 29.8 mL/min (by C-G formula based on Cr of 1.17).    Recent Labs  01/20/15 2324 01/21/15 0315 01/21/15 0732  GLUCAP 189* 173* 146*    Medical History: Past Medical History  Diagnosis Date  . Pneumonia   . Hyperlipidemia   . Hypertension   . Small bowel obstruction ([place])     "I don't know how many" (01/11/2015)  . Ventral hernia with bowel obstruction   . Asthma   . Hyperlipidemia   . Stroke ([place])     "light one"  . COPD (chronic obstructive pulmonary disease)  ([place])   . Hypothyroidism   . Type II diabetes mellitus ([place])   . Anemia   . History of blood transfusion   . GERD (gastroesophageal reflux disease)   . History of stomach ulcers   . Arthritis     "all over"  . Renal insufficiency   . Depression     "light case"  . SIADH (syndrome of inappropriate ADH production) ([place])     [name] 01/10/2015    Insulin Requirements in the past 24 hours:  5u on SSI q4h  Assessment: 71 yof with hx of gastric outlet obstruction, SBO, sequela of perforated pyloric/duodenal ulcer in  2015. Presenting with 4 day history of abdominal pain, progressively worsening,LBM on 10/8 pta, no flatus x 4 days pta on admit. CT suggests SBO vs. Ileus. Pt repeatedly declining surgery and palliative consulted but family declined. NPO since 01/10/15. Pharmacy consulted to start TPN for pSBO. Scheduled for surgery on 10/19. Severe malnutrition in setting of chronic illness noted in RD assessment. Pt at high risk of refeeding syndrome.  Surgeries/Procedures: 10/19: ex-lap w/ LOA  GI: Prealbumin 12.4. high-grade SBO not resolving. Palliative declined, opted for surgery. S/p surgery on 10/19 after failing conservative management. NGT output 250cc/24h (bloody). LBM 10/18. No flatus POD1. PPI  Endo: T2DM. Hypothyroid - synthroid. CBGs 129-189 on SSI q4h. No meds, no insulin pta  Lytes: K 3.9>4, Phos 5.8 (lab error?)>3.5, Mg 2>1.6, CoCa 9.7; Ca x Phos = 34  Renal: CKD3. SCr 0.92>>1.17, CrCl~30, UOP 0.57m/kg/h. D5NSw/20K'@25' , LR'@50'  ordered - not started  Pulm: Dulera  Cards: HTN. Amlodipine, asa81, vastotec, hydralazine, lopressor  Hepatobil: tbili/alk phos wnl but increased, TG 91, alb 2.6, monitor LFT increase AST 29 > 125 ALT 25 > 108  Neuro: Hx CVA. Sertraline. Hydromorphone + morphine post-surgery  ID: Afeb, wbc wnl  Best Practices: hep Eva  TPN Access: PICC 10/18>> TPN start date: 01/19/15>>  Current Nutrition:  Clinimix E 5/15 '@50'  ml/h + 20% IVFE '@10'  ml/h -  provides 1332kcal + 60g protein daily  Nutritional Goals: per RD assessment 10/18 1500-1700 kCal, 75-90 grams of protein per day  Plan:  - Increase Clinimix E 5/15 @ 70 ml/h + 20% IVFE '@10'  ml/h - has been advanced slowly due to refeeding risk - Add multivitamin + trace elements to TPN bag - TPN will provide 1672kcal + 84g protein daily (meeting 100% of kcal/protein needs) - SSI q4h and monitor CBGs - may need to change to resistant SSI - Mag sulfate 2g x 1 - F/u AM labs - Reduce D5NSw/20K to 152mh at 1800 when TPN bag starts - F/u plans to wean TPN as appropriate  HaElicia LampPharmD Clinical Pharmacist Pager [phone]/20/2016 8:49 AM

## 2015-01-21 NOTE — Progress Notes (Signed)
Per Dr. Conley Canal, I removed the Nitroglycerin patch and started a 500 ml 0.9% NS fluid bolus.  Pt's daughter, Shannon [name] in room and aware of BP, bolus, removal of patch, and morphine patient received this morning for pain control.

## 2015-01-21 NOTE — Progress Notes (Addendum)
PROGRESS NOTE    Shannon [name] ZOX:[id number] DOB: [birth year] DOA: 01/10/2015 PCP: Shannon Boys, DO  HPI/Brief narrative 81 y.o. female with history of remote gastrojejunostomy & suspected vagotomy, status post GJ anastomosis resection and GJ revision after perforation of anastomotic ulcer 09/2013, G00 01/2014 with EGD 2: #1 with no anatomic outlet obstruction and normal looking pylorus/duodenum, #2: To breakup bezoar, CT 03/12/14: Recurrent bezoar, thickened pyloric channel, EGD 03/19/14: Tortuous/narrowed anatomy at prepylorus/pylorus but no stricture or ulcer, UGI/SBFT 03/25/14: Normal, CT scan 05/21/14: Gastric distention, stomach filled with fluid/food and thickened pyloric wall, barium swallow 05/22/14: Small esophageal hernia, barium trickled into duodenum, eventually assessed to have functional gastric outlet obstruction (follows with Shannon [name]) presented with 4 day history of progressively worsening abdominal pain. Last BM-small one on 10/8, no flatus since 4 days. In ED, CT abdomen suggestive of PSBO versus ileus. Multiple (3) attempts to place NG tube on floor unsuccessful. IR consulted for fluoroscopic guided NG tube placement. CCS consulted. She had teeth pulled on Wed this week, pain control was with Tylenol 3 but she stopped this after Wed. patient was treated conservatively with bowel rest, NG tube and IV fluids despite which her SBO has not resolved. Patient has repeatedly declined surgery. Palliative care was consulted but family have asked them not to see patient and they signed off 10/14. Eventually agreed to surgery and Had ex lap, LOA 10/19  Assessment/Plan:  SBO  S/p ex lap LOA. Stable postop. Still with NGT. Getting TPN via PICC. Continue SDU  Essential hypertension  Better controlled. Having some bradycardia. Will hold metoprolol  Type II DM controlled  Hypothyroid - IV Synthroid  LFTs up a bit. Will trend  Stage III chronic kidney disease - Creatinine is at  baseline  History of CVA  Anemia - stable  Elevated troponin Echo without WMA. EF normal. Cardiology signed off. On ASA, beta blocker  Tachycardia: no definite fib/flutter. Continue metoprolol  Severe malnutrition in context of chronic illness On TPN   DVT prophylaxis: Subcutaneous heparin  Code Status: DO NOT RESUSCITATE. Family Communication: discussed with daughter [name]  10/19 Disposition Plan:    Consultants:  Gen. Surgery   Palliative-signed off 10/14  Cardiology-signed off  Procedures:  NG tube  2D Echo: Echo Left ventricle: The cavity size was normal. Systolic function was normal. The estimated ejection fraction was in the range of 55% to 60%. Wall motion was normal; there were no regional wall motion abnormalities. - Aortic valve: There was mild to moderate regurgitation. Regurgitation pressure half-time: 365 ms. - Left atrium: The atrium was severely dilated. - Right ventricle: The cavity size was normal. Wall thickness was normal. Systolic function was normal. - Tricuspid valve: There was mild regurgitation. - Pulmonary arteries: PA peak pressure: 42 mm Hg (S). - Inferior vena cava: The vessel was normal in size. The respirophasic diameter changes were in the normal range (>= 50%), consistent with normal central venous pressure.   Antibiotics:  None    Subjective: sleepy  Objective: Filed Vitals:   01/21/15 0311 01/21/15 0320 01/21/15 0728 01/21/15 0840  BP:  157/50 117/36   Pulse:  86 72   Temp: 98.5 F (36.9 C) 98.5 F (36.9 C) 98.9 F (37.2 C)   TempSrc: Oral Oral Oral   Resp:  20 20   Height:      Weight:      SpO2:  97% 99% 100%    Intake/Output Summary (Last 24 hours) at 01/21/15 1035 Last data  filed at 01/21/15 0728  Gross per 24 hour  Intake   1495 ml  Output    650 ml  Net    845 ml   Filed Weights   01/12/15 1123  Weight: 57.97 kg (127 lb 12.8 oz)     Exam:  General exam:  groggy. Arousable, falls back asleep Respiratory system: Clear to auscultation. No increased work of breathing. Cardiovascular system: S1 and S2 heard, RRR. No JVD or, murmurs or pedal edema Gastrointestinal system: quiet. Soft. Dressing in place. Appropriately tender Extremities: Symmetric 5 x 5 power.   Data Reviewed: Basic Metabolic Panel:  Recent Labs Lab 01/15/15 0400 01/18/15 0323 01/19/15 1418 01/19/15 1458 01/20/15 0600 01/21/15 0415  NA 145 138  --   --  132* 133*  K 4.3 3.6  --   --  3.9 4.0  CL 114* 108  --   --  102 105  CO2 21* 21*  --   --  21* 21*  GLUCOSE 145* 144*  --   --  165* 157*  BUN 19 13  --   --  8 19  CREATININE 0.91 1.03*  --   --  0.92 1.17*  CALCIUM 9.9 8.9  --   --  8.6* 8.8*  MG  --   --  1.2* 1.2* 2.0 1.6*  PHOS  --   --  1.8*  --  5.8* 3.5   Liver Function Tests:  Recent Labs Lab 01/20/15 0600 01/21/15 0415  AST 29 125*  ALT 25 78*  ALKPHOS 53 108  BILITOT 0.4 0.7  PROT 5.6* 4.9*  ALBUMIN 2.7* 2.6*   No results for input(s): LIPASE, AMYLASE in the last 168 hours. No results for input(s): AMMONIA in the last 168 hours. CBC:  Recent Labs Lab 01/15/15 0400 01/18/15 0323 01/20/15 0600  WBC 6.3 6.7 6.7  NEUTROABS  --   --  4.4  HGB 12.0 10.7* 10.5*  HCT 34.1* 31.4* 30.6*  MCV 93.7 93.7 92.7  PLT 280 258 256   Cardiac Enzymes: No results for input(s): CKTOTAL, CKMB, CKMBINDEX, TROPONINI in the last 168 hours. BNP (last 3 results) No results for input(s): PROBNP in the last 8760 hours. CBG:  Recent Labs Lab 01/20/15 1254 01/20/15 2118 01/20/15 2324 01/21/15 0315 01/21/15 0732  GLUCAP 139* 164* 189* 173* 146*    No results found for this or any previous visit (from the past 240 hour(s)).       Studies: No results found.     Scheduled Meds: . cloNIDine  0.2 mg Transdermal Weekly  . cycloSPORINE  1 drop Both Eyes BID  . enalaprilat  1.25 mg Intravenous 4 times per day  . fluticasone  2 spray Each Nare  Daily  . heparin  5,000 Units Subcutaneous 3 times per day  . hydrALAZINE  10 mg Intravenous Q6H  . Influenza vac split quadrivalent PF  0.5 mL Intramuscular Tomorrow-1000  . insulin aspart  0-9 Units Subcutaneous 6 times per day  . latanoprost  1 drop Both Eyes QHS  . levothyroxine  112.5 mcg Intravenous QAC breakfast  . lip balm   Topical BID  . magnesium sulfate 1 - 4 g bolus IVPB  2 g Intravenous Once  . metoprolol  5 mg Intravenous 4 times per day  . mometasone-formoterol  2 puff Inhalation BID  . nitroGLYCERIN  0.6 mg Transdermal Daily  . pantoprazole (PROTONIX) IV  40 mg Intravenous Q12H  . polyvinyl alcohol  1 drop Both  Eyes QID  . sertraline  100 mg Per Tube Daily   Continuous Infusions: . dextrose 5 % and 0.9 % NaCl with KCl 20 mEq/L 25 mL/hr at 01/21/15 0026  . dextrose 5 % and 0.9 % NaCl with KCl 20 mEq/L    . Marland KitchenTPN (CLINIMIX-E) Adult 50 mL/hr at 01/20/15 1749   And  . fat emulsion 240 mL (01/20/15 1748)  . Marland KitchenTPN (CLINIMIX-E) Adult     And  . fat emulsion    . lactated ringers    . ropivacaine (PF) 2 mg/ml (0.2%) 5 mL/hr (01/20/15 1520)   Time spent: 25 min minutes.    Christiane Ha, MD [facility]  [email or web address] Password [email or web address] 01/21/2015, 10:35 AM    LOS: 11 days

## 2015-01-21 NOTE — Progress Notes (Signed)
Crushed pills mixed with sterile water and inserted into NGT.  NGT clamped and removed from LIWS for one hour, then LIWS will be resumed.

## 2015-01-21 NOTE — Progress Notes (Signed)
Dr. Conley Canal notified of patient current BP of 91/26 and an immediate prior BP of 86/34.  Awaiting any new orders.

## 2015-01-21 NOTE — Progress Notes (Signed)
Patient ID: Shannon [name], female   DOB: [birth year], 81 y.o.   MRN: [id number] 1 Day Post-Op  Subjective: When asked how she felt this morning the patient stated, "Oh boy!"  No flatus  Objective: Vital signs in last 24 hours: Temp:  [97.7 F (36.5 C)-99.5 F (37.5 C)] 98.9 F (37.2 C) (10/20 0728) Pulse Rate:  [52-96] 86 (10/20 0320) Resp:  [16-27] 20 (10/20 0320) BP: (79-157)/(30-109) 157/50 mmHg (10/20 0320) SpO2:  [95 %-100 %] 97 % (10/20 0320) Last BM Date: 01/19/15  Intake/Output from previous day: 10/19 0701 - 10/20 0700 In: 1550 [I.V.:495; NG/GT:235; TPN:760] Out: 800 [Urine:700; Emesis/NG output:100] Intake/Output this shift:    PE: Abd: soft, hypoactive BS, NGT output with some blood present along with bile, midline incision is c/d/i with staples Heart: regular  Lab Results:   Recent Labs  01/20/15 0600  WBC 6.7  HGB 10.5*  HCT 30.6*  PLT 256   BMET  Recent Labs  01/20/15 0600 01/21/15 0415  NA 132* 133*  K 3.9 4.0  CL 102 105  CO2 21* 21*  GLUCOSE 165* 157*  BUN 8 19  CREATININE 0.92 1.17*  CALCIUM 8.6* 8.8*   PT/INR  Recent Labs  01/20/15 1000  LABPROT 14.5  INR 1.11   CMP     Component Value Date/Time   NA 133* 01/21/2015 0415   NA 137 11/02/2014 1029   K 4.0 01/21/2015 0415   CL 105 01/21/2015 0415   CO2 21* 01/21/2015 0415   GLUCOSE 157* 01/21/2015 0415   GLUCOSE 94 11/02/2014 1029   BUN 19 01/21/2015 0415   BUN 31* 11/02/2014 1029   CREATININE 1.17* 01/21/2015 0415   CREATININE 1.3* 05/06/2014   CALCIUM 8.8* 01/21/2015 0415   PROT 4.9* 01/21/2015 0415   PROT 6.8 11/02/2014 1029   ALBUMIN 2.6* 01/21/2015 0415   ALBUMIN 4.1 11/02/2014 1029   AST 125* 01/21/2015 0415   ALT 78* 01/21/2015 0415   ALKPHOS 108 01/21/2015 0415   BILITOT 0.7 01/21/2015 0415   BILITOT 0.3 11/02/2014 1029   GFRNONAA 40* 01/21/2015 0415   GFRAA 46* 01/21/2015 0415   Lipase     Component Value Date/Time   LIPASE 21* 01/10/2015 0131        Studies/Results: Dg Abd Portable 1v  01/19/2015  CLINICAL DATA:  Small bowel obstruction EXAM: PORTABLE ABDOMEN - 1 VIEW COMPARISON:  01/18/2015 FINDINGS: Disproportionally dilated loops of bowel are stable. There is some colonic gas. Stable NG tube in the body of the stomach. IMPRESSION: Stable partial small bowel obstruction pattern. Electronically Signed   By: Jolaine Click M.D.   On: 01/19/2015 09:32    Anti-infectives: Anti-infectives    Start     Dose/Rate Route Frequency Ordered Stop   01/20/15 0600  ceFAZolin (ANCEF) IVPB 2 g/50 mL premix     2 g 100 mL/hr over 30 Minutes Intravenous On call to O.R. 01/19/15 1251 01/20/15 1151   01/10/15 2200  amoxicillin (AMOXIL) 250 MG/5ML suspension 250 mg  Status:  Discontinued     250 mg Oral Every 12 hours 01/10/15 1703 01/10/15 1704   01/10/15 2200  amoxicillin (AMOXIL) 250 MG/5ML suspension 250 mg  Status:  Discontinued     250 mg Per Tube Every 12 hours 01/10/15 1704 01/10/15 1725   01/10/15 1730  amoxicillin (AMOXIL) 250 MG/5ML suspension 500 mg  Status:  Discontinued     500 mg Per Tube 3 times per day 01/10/15 1725 01/13/15 1333  Assessment/Plan  POD 1, s/p ex lap with LOA -patient has epidural to help with pain control, this will be controlled per anesthesia  -cont NGT and await bowel function.  Will add/increase protonix to help with bloody NGT drainage -mobilize and pulm toilet -dry dressing to midline wound PCM/TNA (prealbumin 12.4) -cont TNA for nutritional support until she can have orals  DVT proph -SCDs/heparin  LOS: 11 days    Cayce Paschal E 01/21/2015, 7:42 AM Pager: [phone]

## 2015-01-21 NOTE — Progress Notes (Signed)
Called about BP 91/26. Will check CBC stat. Hold antihypertensives, bolus 500 cc saline.  Doree Barthel, MD [facility]

## 2015-01-21 NOTE — Progress Notes (Signed)
UR COMPLETED  

## 2015-01-21 NOTE — Progress Notes (Signed)
Dr. Conley Canal in room.  Dr.notified patient's heart rate has started dropping down into the 30's and 40's.  Current BP 92/31 with a HR of 53, sinus brady with occasional PACs and PVCs.

## 2015-01-21 NOTE — Progress Notes (Signed)
Ambulation attempted.  Pt medicated prior to walk, but still very weak and in pain from surgery. Pt declined further attempts to get out of bed.  Will report to oncoming RN.

## 2015-01-21 NOTE — Progress Notes (Signed)
Epidural Progress Note  81 yo F POD #1 s/p x lap for sbo with epidural for post operative pain control. Patient with intermittent hypotension and poor pain control overnight with need for prn narcotics. Epidural infusion running a 21ml/hr  Blood pressure 105/39, pulse 66, temperature 36.8 C, temperature source Oral, resp. rate 20, height 5\' 6"  (1.676 m), weight 127 lb 12.8 oz (57.97 kg), SpO2 97 %.  Patient arousable and alert RRR CTAB Epidural site clean, dry and intact Complains of incisional pain inferior to umbilicus   A/P 81 yo F s/p x lap with epidural for pain control.  - bolus epidural and increase rate to 8 ml/hr to cover full extent of incsion - consider decreasing or holding patient's baseline BP meds in setting of current clinical condition and hypotension. Epidural will induce a degree of vasodilation possibly off-setting need for chronic anti-hypertensives - will continue to follow and wean epidural to off once patient has return of bowel function.  Oleta Mouse, MD 3:53 PM

## 2015-01-21 NOTE — Progress Notes (Signed)
Dr. Ermalene Postin in to see patient.  Dr. Ermalene Postin provided an epidural bolus and increased the infusion rate to 68ml/hr.

## 2015-01-21 NOTE — Progress Notes (Signed)
Dr. Conley Canal notified of patient UO and that the NGT drainage has just now started to be slightly red in nature.

## 2015-01-22 ENCOUNTER — Inpatient Hospital Stay (HOSPITAL_COMMUNITY): Payer: Medicare Other

## 2015-01-22 DIAGNOSIS — R001 Bradycardia, unspecified: Secondary | ICD-10-CM

## 2015-01-22 DIAGNOSIS — R509 Fever, unspecified: Secondary | ICD-10-CM

## 2015-01-22 LAB — COMPREHENSIVE METABOLIC PANEL
ALT: 38 U/L (ref 14–54)
ALT: 38 U/L (ref 14–54)
AST: 32 U/L (ref 15–41)
AST: 27 U/L (ref 15–41)
Albumin: 2.1 g/dL — ABNORMAL LOW (ref 3.5–5.0)
Albumin: 2.3 g/dL — ABNORMAL LOW (ref 3.5–5.0)
Alkaline Phosphatase: 83 U/L (ref 38–126)
Alkaline Phosphatase: 93 U/L (ref 38–126)
Anion gap: 6 (ref 5–15)
Anion gap: 6 (ref 5–15)
BUN: 22 mg/dL — ABNORMAL HIGH (ref 6–20)
BUN: 23 mg/dL — ABNORMAL HIGH (ref 6–20)
CO2: 20 mmol/L — ABNORMAL LOW (ref 22–32)
CO2: 21 mmol/L — ABNORMAL LOW (ref 22–32)
Calcium: 8.4 mg/dL — ABNORMAL LOW (ref 8.9–10.3)
Calcium: 8.7 mg/dL — ABNORMAL LOW (ref 8.9–10.3)
Chloride: 98 mmol/L — ABNORMAL LOW (ref 101–111)
Chloride: 106 mmol/L (ref 101–111)
Creatinine, Ser: 1.12 mg/dL — ABNORMAL HIGH (ref 0.44–1.00)
Creatinine, Ser: 1.05 mg/dL — ABNORMAL HIGH (ref 0.44–1.00)
GFR calc Af Amer: 49 mL/min — ABNORMAL LOW (ref >60)
GFR calc Af Amer: 53 mL/min — ABNORMAL LOW (ref >60)
GFR calc non Af Amer: 42 mL/min — ABNORMAL LOW (ref >60)
GFR calc non Af Amer: 46 mL/min — ABNORMAL LOW (ref >60)
Glucose, Bld: 782 mg/dL (ref 65–99)
Glucose, Bld: 158 mg/dL — ABNORMAL HIGH (ref 65–99)
Potassium: 5.4 mmol/L — ABNORMAL HIGH (ref 3.5–5.1)
Potassium: 4.1 mmol/L (ref 3.5–5.1)
Sodium: 124 mmol/L — ABNORMAL LOW (ref 135–145)
Sodium: 133 mmol/L — ABNORMAL LOW (ref 135–145)
Total Bilirubin: 0.4 mg/dL (ref 0.3–1.2)
Total Bilirubin: 0.3 mg/dL (ref 0.3–1.2)
Total Protein: 4.5 g/dL — ABNORMAL LOW (ref 6.5–8.1)
Total Protein: 5 g/dL — ABNORMAL LOW (ref 6.5–8.1)

## 2015-01-22 LAB — CBC
HCT: 25.4 % — ABNORMAL LOW (ref 36.0–46.0)
Hemoglobin: 8.5 g/dL — ABNORMAL LOW (ref 12.0–15.0)
MCH: 32.2 pg (ref 26.0–34.0)
MCHC: 33.5 g/dL (ref 30.0–36.0)
MCV: 96.2 fL (ref 78.0–100.0)
Platelets: 170 10*3/uL (ref 150–400)
RBC: 2.64 MIL/uL — ABNORMAL LOW (ref 3.87–5.11)
RDW: 15.4 % (ref 11.5–15.5)
WBC: 10.9 10*3/uL — ABNORMAL HIGH (ref 4.0–10.5)

## 2015-01-22 LAB — GLUCOSE, CAPILLARY
Glucose-Capillary: 170 mg/dL — ABNORMAL HIGH (ref 65–99)
Glucose-Capillary: 151 mg/dL — ABNORMAL HIGH (ref 65–99)
Glucose-Capillary: 156 mg/dL — ABNORMAL HIGH (ref 65–99)
Glucose-Capillary: 140 mg/dL — ABNORMAL HIGH (ref 65–99)
Glucose-Capillary: 131 mg/dL — ABNORMAL HIGH (ref 65–99)
Glucose-Capillary: 142 mg/dL — ABNORMAL HIGH (ref 65–99)

## 2015-01-22 LAB — PHOSPHORUS
Phosphorus: 5.8 mg/dL — ABNORMAL HIGH (ref 2.5–4.6)
Phosphorus: 3.1 mg/dL (ref 2.5–4.6)

## 2015-01-22 LAB — URINALYSIS, ROUTINE W REFLEX MICROSCOPIC
Bilirubin Urine: NEGATIVE
Glucose, UA: NEGATIVE mg/dL
Ketones, ur: NEGATIVE mg/dL
Leukocytes, UA: NEGATIVE
Nitrite: NEGATIVE
Protein, ur: 30 mg/dL — AB
Specific Gravity, Urine: 1.014 (ref 1.005–1.030)
Urobilinogen, UA: 1 mg/dL (ref 0.0–1.0)
pH: 6 (ref 5.0–8.0)

## 2015-01-22 LAB — URINE MICROSCOPIC-ADD ON

## 2015-01-22 LAB — MAGNESIUM
Magnesium: 2.3 mg/dL (ref 1.7–2.4)
Magnesium: 1.9 mg/dL (ref 1.7–2.4)

## 2015-01-22 LAB — MRSA PCR SCREENING: MRSA by PCR: POSITIVE — AB

## 2015-01-22 MED ORDER — TRACE MINERALS CR-CU-MN-SE-ZN 10-1000-500-60 MCG/ML IV SOLN
INTRAVENOUS | Status: AC
  Administered 2015-01-22: 18:00:00 via INTRAVENOUS
  Filled 2015-01-22: qty 1680

## 2015-01-22 MED ORDER — FAT EMULSION 20 % IV EMUL
240.0000 mL | INTRAVENOUS | Status: AC
  Administered 2015-01-22: 240 mL via INTRAVENOUS
  Filled 2015-01-22: qty 250

## 2015-01-22 MED ORDER — ENALAPRILAT 1.25 MG/ML IV SOLN
0.6250 mg | Freq: Four times a day (QID) | INTRAVENOUS | Status: DC
  Administered 2015-01-22 – 2015-01-23 (×3): 0.625 mg via INTRAVENOUS
  Filled 2015-01-22 (×9): qty 0.5

## 2015-01-22 MED ORDER — MUPIROCIN CALCIUM 2 % EX CREA
TOPICAL_CREAM | Freq: Two times a day (BID) | CUTANEOUS | Status: DC
  Administered 2015-01-22 – 2015-01-23 (×3): via TOPICAL
  Administered 2015-01-24: 1 via TOPICAL
  Administered 2015-01-24 – 2015-01-25 (×2): via TOPICAL
  Administered 2015-01-25: 1 via TOPICAL
  Administered 2015-01-26 – 2015-01-27 (×3): via TOPICAL
  Filled 2015-01-22 (×2): qty 15

## 2015-01-22 MED ORDER — DEXTROSE 5 % IV SOLN
1.0000 g | Freq: Two times a day (BID) | INTRAVENOUS | Status: DC
  Administered 2015-01-22 – 2015-01-26 (×8): 1 g via INTRAVENOUS
  Filled 2015-01-22 (×12): qty 1

## 2015-01-22 MED ORDER — CHLORHEXIDINE GLUCONATE CLOTH 2 % EX PADS
6.0000 | MEDICATED_PAD | Freq: Every day | CUTANEOUS | Status: DC
  Administered 2015-01-23 – 2015-01-27 (×5): 6 via TOPICAL

## 2015-01-22 MED ORDER — VANCOMYCIN HCL IN DEXTROSE 750-5 MG/150ML-% IV SOLN
750.0000 mg | INTRAVENOUS | Status: DC
  Administered 2015-01-22 – 2015-01-24 (×3): 750 mg via INTRAVENOUS
  Filled 2015-01-22 (×4): qty 150

## 2015-01-22 NOTE — Progress Notes (Signed)
Anesthesia epidural note Pt resting comfortably and denies pain currently. Some tenderness in lower abdomen with palpation. 5/5 strength in lower extremities. Epidural site CDI. BP 180s/80s. Increased infusion to 44ml/hr   Plan: Catheter day 3. Continue epidural. Pt should be fine to ambulate with assistance. Please call anesthesia if pain not controlled with epidural.

## 2015-01-22 NOTE — Progress Notes (Signed)
Anesthesiologist at bedside along with Dr. Conley Canal.  Anesthesiologist increased epidural rate to 10 ml/hr.

## 2015-01-22 NOTE — Progress Notes (Signed)
Patient ID: Shannon [name], female   DOB: [birth year], 81 y.o.   MRN: [id number]     [facility]      [address] [place]   [place], [place] [address]    Phone: [phone] FAX: [phone]     Subjective: Alert, sore.  Not sure if she's passing flatus.  NGT.  Febrile 101.3.  BP stable now.  uop ok.   WBC trending down.    Objective:  Vital signs:  Filed Vitals:   01/22/15 0200 01/22/15 0400 01/22/15 0500 01/22/15 0600  BP: 163/51 165/56 155/56 154/52  Pulse: 82 82 82 74  Temp:   98 F (36.7 C)   TempSrc:   Oral   Resp: 16 19 28 19   Height:      Weight:      SpO2: 99% 100% 100% 100%    Last BM Date: 01/19/15  Intake/Output   Yesterday:  10/20 0701 - 10/21 0700 In: 3073.4 [I.V.:515.5; NG/GT:80; IV Piggyback:500; TPN:1657.2] Out: 1475 [Urine:1050; Emesis/NG output:425] This shift:    I/O last 3 completed shifts: In: 4338.4 [I.V.:890.5; Other:375.7; NG/GT:255; IV Piggyback:500] Out: 1825 [Urine:1400; Emesis/NG output:425]    Physical Exam: General: Pt awake/alert/oriented x4 in no acute distress  Abdomen: Soft.  Nondistended.  Mildly tender at incisions only.  Incision is c/d/i, staples in place, no erythema.  No evidence of peritonitis.  No incarcerated hernias.   Problem List:   Principal Problem:   SBO (small bowel obstruction) (HCC) Active Problems:   AP (abdominal pain)   SIADH (syndrome of inappropriate ADH production) (HCC)   Protein-calorie malnutrition, severe   Elevated troponin   HTN (hypertension), uncontrolled   Palliative care encounter   DNR (do not resuscitate) discussion   Bradycardia   Hypotension    Results:   Labs: Results for orders placed or performed during the hospital encounter of 01/10/15 (from the past 48 hour(s))  Protime-INR     Status: None   Collection Time: 01/20/15 10:00 AM  Result Value Ref Range   Prothrombin Time 14.5 11.6 - 15.2 seconds   INR 1.11 0.00 - 1.49   Glucose, capillary     Status: Abnormal   Collection Time: 01/20/15 12:54 PM  Result Value Ref Range   Glucose-Capillary 139 (H) 65 - 99 mg/dL   Comment 1 Notify RN    Comment 2 Document in Chart   Glucose, capillary     Status: Abnormal   Collection Time: 01/20/15  9:18 PM  Result Value Ref Range   Glucose-Capillary 164 (H) 65 - 99 mg/dL  Glucose, capillary     Status: Abnormal   Collection Time: 01/20/15 11:24 PM  Result Value Ref Range   Glucose-Capillary 189 (H) 65 - 99 mg/dL  Glucose, capillary     Status: Abnormal   Collection Time: 01/21/15  3:15 AM  Result Value Ref Range   Glucose-Capillary 173 (H) 65 - 99 mg/dL  Comprehensive metabolic panel     Status: Abnormal   Collection Time: 01/21/15  4:15 AM  Result Value Ref Range   Sodium 133 (L) 135 - 145 mmol/L   Potassium 4.0 3.5 - 5.1 mmol/L   Chloride 105 101 - 111 mmol/L   CO2 21 (L) 22 - 32 mmol/L   Glucose, Bld 157 (H) 65 - 99 mg/dL   BUN 19 6 - 20 mg/dL   Creatinine, Ser 4.74 (H) 0.44 - 1.00 mg/dL   Calcium 8.8 (L) 8.9 - 10.3 mg/dL  Total Protein 4.9 (L) 6.5 - 8.1 g/dL   Albumin 2.6 (L) 3.5 - 5.0 g/dL   AST 161 (H) 15 - 41 U/L   ALT 78 (H) 14 - 54 U/L   Alkaline Phosphatase 108 38 - 126 U/L   Total Bilirubin 0.7 0.3 - 1.2 mg/dL   GFR calc non Af Amer 40 (L) >60 mL/min   GFR calc Af Amer 46 (L) >60 mL/min    Comment: (NOTE) The eGFR has been calculated using the CKD EPI equation. This calculation has not been validated in all clinical situations. eGFR's persistently <60 mL/min signify possible Chronic Kidney Disease.    Anion gap 7 5 - 15  Magnesium     Status: Abnormal   Collection Time: 01/21/15  4:15 AM  Result Value Ref Range   Magnesium 1.6 (L) 1.7 - 2.4 mg/dL  Phosphorus     Status: None   Collection Time: 01/21/15  4:15 AM  Result Value Ref Range   Phosphorus 3.5 2.5 - 4.6 mg/dL  Glucose, capillary     Status: Abnormal   Collection Time: 01/21/15  7:32 AM  Result Value Ref Range    Glucose-Capillary 146 (H) 65 - 99 mg/dL   Comment 1 Notify RN    Comment 2 Document in Chart   Glucose, capillary     Status: Abnormal   Collection Time: 01/21/15 11:58 AM  Result Value Ref Range   Glucose-Capillary 135 (H) 65 - 99 mg/dL   Comment 1 Notify RN    Comment 2 Document in Chart   CBC     Status: Abnormal   Collection Time: 01/21/15 12:32 PM  Result Value Ref Range   WBC 13.0 (H) 4.0 - 10.5 K/uL   RBC 2.71 (L) 3.87 - 5.11 MIL/uL   Hemoglobin 8.8 (L) 12.0 - 15.0 g/dL   HCT 09.6 (L) 04.5 - 40.9 %   MCV 93.4 78.0 - 100.0 fL   MCH 32.5 26.0 - 34.0 pg   MCHC 34.8 30.0 - 36.0 g/dL   RDW 81.1 91.4 - 78.2 %   Platelets 182 150 - 400 K/uL  Type and screen [facility]     Status: None (Preliminary result)   Collection Time: 01/21/15 12:32 PM  Result Value Ref Range   ABO/RH(D) AB POS    Antibody Screen POS    Sample Expiration 01/24/2015    Antibody Identification ANTI E    PT AG Type NEGATIVE FOR E ANTIGEN NEGATIVE FOR KELL ANTIGEN    DAT, IgG NEG    Unit Number [id number]    Blood Component Type RED CELLS,LR    Unit division 00    Status of Unit ALLOCATED    Transfusion Status OK TO TRANSFUSE    Crossmatch Result COMPATIBLE    Unit Number [id number]    Blood Component Type RED CELLS,LR    Unit division 00    Status of Unit ALLOCATED    Transfusion Status OK TO TRANSFUSE    Crossmatch Result COMPATIBLE   Glucose, capillary     Status: Abnormal   Collection Time: 01/21/15  3:37 PM  Result Value Ref Range   Glucose-Capillary 139 (H) 65 - 99 mg/dL  Glucose, capillary     Status: Abnormal   Collection Time: 01/21/15  8:34 PM  Result Value Ref Range   Glucose-Capillary 143 (H) 65 - 99 mg/dL  Glucose, capillary     Status: Abnormal   Collection Time: 01/22/15  1:38 AM  Result  Value Ref Range   Glucose-Capillary 170 (H) 65 - 99 mg/dL  Magnesium     Status: None   Collection Time: 01/22/15  4:45 AM  Result Value Ref Range   Magnesium 2.3 1.7 -  2.4 mg/dL  Phosphorus     Status: Abnormal   Collection Time: 01/22/15  4:45 AM  Result Value Ref Range   Phosphorus 5.8 (H) 2.5 - 4.6 mg/dL  CBC     Status: Abnormal   Collection Time: 01/22/15  4:45 AM  Result Value Ref Range   WBC 10.9 (H) 4.0 - 10.5 K/uL   RBC 2.64 (L) 3.87 - 5.11 MIL/uL   Hemoglobin 8.5 (L) 12.0 - 15.0 g/dL   HCT 08.6 (L) 57.8 - 46.9 %   MCV 96.2 78.0 - 100.0 fL   MCH 32.2 26.0 - 34.0 pg   MCHC 33.5 30.0 - 36.0 g/dL   RDW 62.9 52.8 - 41.3 %   Platelets 170 150 - 400 K/uL  Comprehensive metabolic panel     Status: Abnormal   Collection Time: 01/22/15  4:45 AM  Result Value Ref Range   Sodium 124 (L) 135 - 145 mmol/L    Comment: DELTA CHECK NOTED   Potassium 5.4 (H) 3.5 - 5.1 mmol/L    Comment: DELTA CHECK NOTED   Chloride 98 (L) 101 - 111 mmol/L   CO2 20 (L) 22 - 32 mmol/L   Glucose, Bld 782 (HH) 65 - 99 mg/dL    Comment: CRITICAL RESULT CALLED TO, READ BACK BY AND VERIFIED WITH: USSERY,K RN 01/22/2015 0612 JORDANS    BUN 22 (H) 6 - 20 mg/dL   Creatinine, Ser 2.44 (H) 0.44 - 1.00 mg/dL   Calcium 8.4 (L) 8.9 - 10.3 mg/dL   Total Protein 4.5 (L) 6.5 - 8.1 g/dL   Albumin 2.1 (L) 3.5 - 5.0 g/dL   AST 32 15 - 41 U/L   ALT 38 14 - 54 U/L   Alkaline Phosphatase 83 38 - 126 U/L   Total Bilirubin 0.4 0.3 - 1.2 mg/dL   GFR calc non Af Amer 42 (L) >60 mL/min   GFR calc Af Amer 49 (L) >60 mL/min    Comment: (NOTE) The eGFR has been calculated using the CKD EPI equation. This calculation has not been validated in all clinical situations. eGFR's persistently <60 mL/min signify possible Chronic Kidney Disease.    Anion gap 6 5 - 15  Glucose, capillary     Status: Abnormal   Collection Time: 01/22/15  5:10 AM  Result Value Ref Range   Glucose-Capillary 151 (H) 65 - 99 mg/dL    Imaging / Studies: No results found.  Medications / Allergies:  Scheduled Meds: . cycloSPORINE  1 drop Both Eyes BID  . fluticasone  2 spray Each Nare Daily  . Influenza vac  split quadrivalent PF  0.5 mL Intramuscular Tomorrow-1000  . insulin aspart  0-9 Units Subcutaneous 6 times per day  . latanoprost  1 drop Both Eyes QHS  . levothyroxine  112.5 mcg Intravenous QAC breakfast  . lip balm   Topical BID  . mometasone-formoterol  2 puff Inhalation BID  . pantoprazole (PROTONIX) IV  40 mg Intravenous Q12H  . polyvinyl alcohol  1 drop Both Eyes QID   Continuous Infusions: . dextrose 5 % and 0.9 % NaCl with KCl 20 mEq/L 30 mL/hr at 01/21/15 2111  . Marland KitchenTPN (CLINIMIX-E) Adult 70 mL/hr at 01/21/15 1745   And  . fat emulsion 240 mL (  01/21/15 1744)  . ropivacaine (PF) 2 mg/ml (0.2%) 8 mL/hr (01/21/15 2143)   PRN Meds:.acetaminophen, albuterol, alum & mag hydroxide-simeth, dextrose, diphenhydrAMINE, hydrALAZINE, iohexol, magic mouthwash, menthol-cetylpyridinium, metoprolol, morphine injection, nitroGLYCERIN, phenol, promethazine, sodium chloride, sodium chloride  Antibiotics: Anti-infectives    Start     Dose/Rate Route Frequency Ordered Stop   01/20/15 0600  ceFAZolin (ANCEF) IVPB 2 g/50 mL premix     2 g 100 mL/hr over 30 Minutes Intravenous On call to O.R. 01/19/15 1251 01/20/15 1151   01/10/15 2200  amoxicillin (AMOXIL) 250 MG/5ML suspension 250 mg  Status:  Discontinued     250 mg Oral Every 12 hours 01/10/15 1703 01/10/15 1704   01/10/15 2200  amoxicillin (AMOXIL) 250 MG/5ML suspension 250 mg  Status:  Discontinued     250 mg Per Tube Every 12 hours 01/10/15 1704 01/10/15 1725   01/10/15 1730  amoxicillin (AMOXIL) 250 MG/5ML suspension 500 mg  Status:  Discontinued     500 mg Per Tube 3 times per day 01/10/15 1725 01/13/15 1333        Assessment/Plan SBO POD#2 exploratory laparotomy, LOA---Dr. Magnus Ivan -clamp NGT and allow for sips of clears from the floor -epidural per anesthesia -leave midline incision OTA -mobilize and pulmonary toilet(pulling IS) FEN-sips, add PO pain meds once tolerating liquids and NGT out.  Hyponatremia and hyperkalemia,  per medicine  PCM-TPN VTE prophylaxis-SCD, heparin held for NGT bleeding, looks bilious now.  ID-fevers, WBC down to 10.9.  Too early for intra-abdominal abscess. Continue to monitor.  Dispo-SDU  Ashok Norris, [facility] Pager [phone]) For consults and floor pages call [phone])  01/22/2015 7:46 AM

## 2015-01-22 NOTE — Progress Notes (Signed)
PARENTERAL NUTRITION CONSULT NOTE - INITIAL  Pharmacy Consult for TPN Indication: SBO  No Known Allergies  Patient Measurements: Height: _0  (167.6 cm) (taken from 11/06/14) Weight: 127 lb 12.8 oz (57.97 kg) IBW/kg (Calculated) : 59.3  Vital Signs: Temp: 98.7 F (37.1 C) (10/21 1115) Temp Source: Oral (10/21 1115) BP: 184/63 mmHg (10/21 1115) Pulse Rate: 86 (10/21 1115) Intake/Output from previous day: 10/20 0701 - 10/21 0700 In: 3073.4 [I.V.:515.5; NG/GT:80; IV Piggyback:500; TPN:1657.2] Out: 1475 [Urine:1050; Emesis/NG output:425] Intake/Output from this shift: Total I/O In: 15 [NG/GT:15] Out: 150 [Urine:150]  Labs:  Recent Labs  01/20/15 0600 01/20/15 1000 01/21/15 1232 01/22/15 0445  WBC 6.7  --  13.0* 10.9*  HGB 10.5*  --  8.8* 8.5*  HCT 30.6*  --  25.3* 25.4*  PLT 256  --  182 170  INR  --  1.11  --   --      Recent Labs  01/20/15 0445  01/20/15 0600 01/21/15 0415 01/22/15 0445 01/22/15 0831  NA  --   < > 132* 133* 124* 133*  K  --   < > 3.9 4.0 5.4* 4.1  CL  --   < > 102 105 98* 106  CO2  --   < > 21* 21* 20* 21*  GLUCOSE  --   < > 165* 157* 782* 158*  BUN  --   < > 8 19 22* 23*  CREATININE  --   < > 0.92 1.17* 1.12* 1.05*  CALCIUM  --   < > 8.6* 8.8* 8.4* 8.7*  MG  --   --  2.0 1.6* 2.3 1.9  PHOS  --   --  5.8* 3.5 5.8* 3.1  PROT  --   < > 5.6* 4.9* 4.5* 5.0*  ALBUMIN  --   < > 2.7* 2.6* 2.1* 2.3*  AST  --   < > 29 125* 32 27  ALT  --   < > 25 78* 38 38  ALKPHOS  --   < > 53 108 83 93  BILITOT  --   < > 0.4 0.7 0.4 0.3  PREALBUMIN 12.4*  --   --   --   --   --   TRIG  --   --  91  --   --   --   < > = values in this interval not displayed. Estimated Creatinine Clearance: 33.3 mL/min (by C-G formula based on Cr of 1.05).    Recent Labs  01/22/15 0138 01/22/15 0510 01/22/15 0820  GLUCAP 170* 151* 156*    Insulin Requirements in the past 24 hours:  6u on SSI q4h  Assessment: 42 yof with hx of gastric outlet obstruction, SBO,  sequela of perforated pyloric/duodenal ulcer in 2015. Presenting with 4 day history of abdominal pain, progressively worsening,LBM on 10/8 pta, no flatus x 4 days pta on admit. CT suggests SBO vs. Ileus. Pt repeatedly declining surgery and palliative consulted but family declined. NPO since 01/10/15. Pharmacy consulted to start TPN for pSBO. Scheduled for surgery on 10/19. Severe malnutrition in setting of chronic illness noted in RD assessment. Pt at high risk of refeeding syndrome.  Surgeries/Procedures: 10/19: ex-lap w/ LOA  GI: Prealbumin 12.4. high-grade SBO not resolving. Palliative declined, opted for surgery. S/p surgery on 10/19 after failing conservative management. NGT clamped. LBM 10/18. PPI  Endo: T2DM. Hypothyroid - synthroid. CBGs 143-170 on SSI q4h. No meds, no insulin pta  Lytes: K 4.1, Phos 3.1, Mg 1.9,  CoCa 9.7  Renal: CKD3. SCr 1.05, CrCl~30, UOP 0.27m/kg/h. D5NSw/20K_0 ,   Pulm: Dulera - 100% on RA  Cards: HTN. Amlodipine, asa81, vastotec, hydralazine, lopressor  Hepatobil: tbili/alk phos wnl but increased, TG 91, alb 2.3, monitor LFT increase AST 27 ALT38 Tbibl 0.3  Neuro: Hx CVA. Sertraline. Hydromorphone + morphine post-surgery  ID: Tmax 101.3, wbc10.9 - on no antibiotics  Best Practices: hep [place]  TPN Access: PICC 10/18>> TPN start date: 01/19/15>>  Current Nutrition:  Clinimix E 5/15 _1  ml/h + 20% IVFE _2  ml/h - provides 1332kcal + 60g protein daily  Nutritional Goals: per RD assessment 10/18 1500-1700 kCal, 75-90 grams of protein per day  Plan:  - Continue Clinimix E 5/15 @ 70 ml/h + 20% IVFE _3  ml/h (has been advanced slowly due to refeeding risk) - TPN will provide 1672kcal + 84g protein daily (meeting 100% of kcal/protein needs) - SSI q4h and monitor CBGs - may need to change to resistant SSI - F/u AM labs - F/u plans to wean TPN as appropriate  JHeide Guile PharmD, BCPS-AQ ID Clinical Pharmacist Pager [phone]  01/22/2015 12:30 PM

## 2015-01-22 NOTE — Care Management Note (Signed)
Case Management Note  Patient Details  Name: Shannon [name] MRN: [id number] Date of Birth: December 01, 1923  Subjective/Objective:                 Admitted with SBO, s/p EXPLORATORY LAPAROTOMY,  LYSIS OF ADHESIONS. Independent with ADL's . Uses no DME.  Action/Plan:  Discharge planning.CM to f/u with dispostition needs.  Expected Discharge Date:         unknown         Expected Discharge Plan:  [place] (Daughter expressing SNF/REHAB )  In-House Referral:  Clinical Social Work  Discharge planning Services  CM Consult  Post Acute Care Choice:    Choice offered to:  Patient, Sibling Manufacturing engineer)  DME Arranged:    DME Agency:     HH Arranged:    [place] Agency:     Status of Service:  In process, will continue to follow  Medicare Important Message Given:   Date Medicare IM Given:    Medicare IM give by:    Date Additional Medicare IM Given:    Additional Medicare Important Message give by:     If discussed at [place] of Stay Meetings, dates discussed:    Additional Comments: CM spoke with pt /mom regarding d/c planning. Pt  Alert and orient x 4 stated daughter(Shannon [name]) is her spokes person and it's ok for CM to talk with daughter. Daughter expressed their plan @ d/c is for pt to go to SNF/REHAB ([facility]). CM made referral to [place].CM to f/u with disposition needs.  Shannon [name] (Daughter)  [phone]    Whitman Hero [place], [place] [phone] 01/22/2015, 2:15 PM

## 2015-01-22 NOTE — Progress Notes (Signed)
Physical Therapy Treatment Patient Details Name: Shannon [name] MRN: [id number] DOB: 1923-12-09 Today's Date: 01/22/2015    History of Present Illness 81 y.o. female admitted with SBO, now s/p Exploratory Laparotomy.  Pt with h/o gastric outlet obstruction, SBO, sequela of a perforated pyloric / duodenal ulcer and SIADH (syndrome of inappropriate ADH production)     PT Comments    Pt's BP initially 200/62.  RN gave IV BP meds with systolic then decreasing to 170's.  Upon sitting to the EOB BP taken again and systolic had decreased to 120's with pt c/o dizziness.  Limited activity to sitting EOB for ~41mins with continued "mild" feelings of dizziness.  Pt returned to bed and placed in semi chair position to promote upright positioning.  Feel pt would benefit from continued therapies prior to returning to home.  Updated D/C plan to CIR.    Follow Up Recommendations  CIR     Equipment Recommendations  None recommended by PT    Recommendations for Other Services       Precautions / Restrictions Precautions Precautions: Fall Precaution Comments: NG tube Restrictions Weight Bearing Restrictions: No    Mobility  Bed Mobility Overal bed mobility: Needs Assistance Bed Mobility: Supine to Sit;Sit to Supine     Supine to sit: Mod assist;HOB elevated Sit to supine: Mod assist   General bed mobility comments: pt needing increased A with bed mobility due to overall weakness and fatigue.    Transfers                    Ambulation/Gait                 Stairs            Wheelchair Mobility    Modified Rankin (Stroke Patients Only)       Balance Overall balance assessment: Needs assistance Sitting-balance support: Bilateral upper extremity supported;Feet supported Sitting balance-Leahy Scale: Fair                              Cognition Arousal/Alertness: Awake/alert Behavior During Therapy: WFL for tasks assessed/performed Overall Cognitive  Status: Within Functional Limits for tasks assessed                      Exercises      General Comments        Pertinent Vitals/Pain Pain Assessment: 0-10 Pain Score: 2  Pain Location: Nose and Throat from NG Pain Descriptors / Indicators: Sore Pain Intervention(s): Monitored during session;Premedicated before session;Repositioned    Home Living                      Prior Function            PT Goals (current goals can now be found in the care plan section) Acute Rehab PT Goals Patient Stated Goal: none stated PT Goal Formulation: With patient Time For Goal Achievement: 01/28/15 Potential to Achieve Goals: Fair Progress towards PT goals: Not progressing toward goals - comment (weakness and fatigue)    Frequency  Min 3X/week    PT Plan Discharge plan needs to be updated    Co-evaluation             End of Session   Activity Tolerance: Patient limited by fatigue Patient left: in bed;with call bell/phone within reach (Chair position)     Time: 3220-2542 PT Time Calculation (min) (ACUTE  ONLY): 29 min  Charges:  $Therapeutic Activity: 23-37 mins                    G CodesSunny [name], Shannon [name] [phone] 01/22/2015, 3:13 PM

## 2015-01-22 NOTE — Progress Notes (Signed)
ANTIBIOTIC CONSULT NOTE - INITIAL  Pharmacy Consult for ceftazidime and vancomycin Indication: HCAP  No Known Allergies  Patient Measurements: Height: 5\' 6"  (167.6 cm) (taken from 11/06/14) Weight: 127 lb 12.8 oz (57.97 kg) IBW/kg (Calculated) : 59.3   Vital Signs: Temp: 98.1 F (36.7 C) (10/21 1540) Temp Source: Oral (10/21 1540) BP: 151/51 mmHg (10/21 1540) Pulse Rate: 79 (10/21 1540) Intake/Output from previous day: 10/20 0701 - 10/21 0700 In: 3073.4 [I.V.:515.5; NG/GT:80; IV Piggyback:500; TPN:1657.2] Out: 1475 [Urine:1050; Emesis/NG output:425] Intake/Output from this shift: Total I/O In: 30 [Other:15; NG/GT:15] Out: 825 [Urine:825]  Labs:  Recent Labs  01/20/15 0600 01/21/15 0415 01/21/15 1232 01/22/15 0445 01/22/15 0831  WBC 6.7  --  13.0* 10.9*  --   HGB 10.5*  --  8.8* 8.5*  --   PLT 256  --  182 170  --   CREATININE 0.92 1.17*  --  1.12* 1.05*   Estimated Creatinine Clearance: 33.3 mL/min (by C-G formula based on Cr of 1.05). No results for input(s): VANCOTROUGH, VANCOPEAK, VANCORANDOM, GENTTROUGH, GENTPEAK, GENTRANDOM, TOBRATROUGH, TOBRAPEAK, TOBRARND, AMIKACINPEAK, AMIKACINTROU, AMIKACIN in the last 72 hours.   Microbiology: No results found for this or any previous visit (from the past 720 hour(s)).  Medical History: Past Medical History  Diagnosis Date  . Pneumonia   . Hyperlipidemia   . Hypertension   . Small bowel obstruction ([place])     "I don't know how many" (01/11/2015)  . Ventral hernia with bowel obstruction   . Asthma   . Hyperlipidemia   . Stroke ([place])     "light one"  . COPD (chronic obstructive pulmonary disease) ([place])   . Hypothyroidism   . Type II diabetes mellitus ([place])   . Anemia   . History of blood transfusion   . GERD (gastroesophageal reflux disease)   . History of stomach ulcers   . Arthritis     "all over"  . Renal insufficiency   . Depression     "light case"  . SIADH (syndrome of inappropriate ADH production)  ([place])     [name] 01/10/2015    Assessment: 81 yo F to start ceftazidime and vancomycin for HCAP.  Creat 1.05, T max 101.3 last night.  WBC 10.9. 10/21: CXR:  New bibasilar infiltrates vs atelectasis  ceftaz 10/21>> vanc 10/21>>  10/21 BCx2>> 10/21 Ucx>>   Goal of Therapy:  Vancomycin trough level 15-20 mcg/ml  Plan:  -ceftazidime 1 gm IV q12h -vancomycin 750 mg IV q24 -f/u renal fxn, wbc, temp, culture data -vanc levels as needed  Eudelia Bunch, Pharm.D. [id number] 01/22/2015 5:09 PM

## 2015-01-22 NOTE — Progress Notes (Signed)
Rehab Admissions Coordinator Note:  Patient was screened by Retta Diones for appropriateness for an Inpatient Acute Rehab Consult.  Noted PT recommending inpatient rehab consult.  Noted daughter also interested in [facility]. Would like to see how she does over weekend and then can decide if she needs an inpatient rehab consult.  Retta Diones 01/22/2015, 3:55 PM  I can be reached at [phone].

## 2015-01-22 NOTE — Progress Notes (Signed)
PROGRESS NOTE    Shannon [name] ONG:[id number] DOB: 1923/09/17 DOA: 01/10/2015 PCP: Kirt Boys, DO  HPI/Brief narrative 81 y.o. female with history of remote gastrojejunostomy & suspected vagotomy, status post GJ anastomosis resection and GJ revision after perforation of anastomotic ulcer 09/2013, G00 01/2014 with EGD 2: #1 with no anatomic outlet obstruction and normal looking pylorus/duodenum, #2: To breakup bezoar, CT 03/12/14: Recurrent bezoar, thickened pyloric channel, EGD 03/19/14: Tortuous/narrowed anatomy at prepylorus/pylorus but no stricture or ulcer, UGI/SBFT 03/25/14: Normal, CT scan 05/21/14: Gastric distention, stomach filled with fluid/food and thickened pyloric wall, barium swallow 05/22/14: Small esophageal hernia, barium trickled into duodenum, eventually assessed to have functional gastric outlet obstruction (follows with [facility]) presented with 4 day history of progressively worsening abdominal pain. Last BM-small one on 10/8, no flatus since 4 days. In ED, CT abdomen suggestive of PSBO versus ileus. Multiple (3) attempts to place NG tube on floor unsuccessful. IR consulted for fluoroscopic guided NG tube placement. CCS consulted. She had teeth pulled on Wed this week, pain control was with Tylenol 3 but she stopped this after Wed. patient was treated conservatively with bowel rest, NG tube and IV fluids despite which her SBO has not resolved. Patient has repeatedly declined surgery. Palliative care was consulted but family have asked them not to see patient and they signed off 10/14. Eventually agreed to surgery and Had ex lap, LOA 10/19  Assessment/Plan:  SBO  S/p ex lap LOA. Stable postop. NG clamped. Getting TPN via PICC. Continue SDU  Essential hypertension  Had hypotension yesterday requiring fluid bolus and d/c meds, now high again. Will resume vasotec IV lower dose. Continue hydralazine prn  Fever: has cough with grey sputum. Will check CXR, UA cx and blood  cultures.  Type II DM controlled  Hypothyroid - IV Synthroid  Abnormal labs: likely erroneous. Awaiting repeat  Stage III chronic kidney disease - Creatinine is at baseline  History of CVA  Anemia - stable  Elevated troponin Echo without WMA. EF normal. Cardiology signed off. On ASA, beta blocker  Tachycardia: metoprolol held for bradycardia 10/20  Severe malnutrition in context of chronic illness On TPN  Anemia and blood tinged NG aspirate: heparin held. On SCDs.   DVT prophylaxis: Subcutaneous heparin  Code Status: DO NOT RESUSCITATE. Family Communication: discussed with daughter [name]  10/19 Disposition Plan:    Consultants:  Gen. Surgery   Palliative-signed off 10/14  Cardiology-signed off  Procedures:  NG tube  2D Echo: Echo Left ventricle: The cavity size was normal. Systolic function was normal. The estimated ejection fraction was in the range of 55% to 60%. Wall motion was normal; there were no regional wall motion abnormalities. - Aortic valve: There was mild to moderate regurgitation. Regurgitation pressure half-time: 365 ms. - Left atrium: The atrium was severely dilated. - Right ventricle: The cavity size was normal. Wall thickness was normal. Systolic function was normal. - Tricuspid valve: There was mild regurgitation. - Pulmonary arteries: PA peak pressure: 42 mm Hg (S). - Inferior vena cava: The vessel was normal in size. The respirophasic diameter changes were in the normal range (>= 50%), consistent with normal central venous pressure.   Antibiotics:  None    Subjective: No complaints. Per RN, cough -->gray sputum  Objective: Filed Vitals:   01/22/15 0500 01/22/15 0600 01/22/15 0800 01/22/15 0915  BP: 155/56 154/52 178/57   Pulse: 82 74 78   Temp: 98 F (36.7 C)  99.6 F (37.6 C)   TempSrc:  Oral  Oral   Resp: 28 19 19    Height:      Weight:      SpO2: 100% 100% 100% 100%     Intake/Output Summary (Last 24 hours) at 01/22/15 1042 Last data filed at 01/22/15 0800  Gross per 24 hour  Intake 2968.36 ml  Output   1625 ml  Net 1343.36 ml   Filed Weights   01/12/15 1123  Weight: 57.97 kg (127 lb 12.8 oz)     Exam:  General exam: alert. cooperative Respiratory system: Clear to auscultation. No increased work of breathing. Cardiovascular system: S1 and S2 heard, RRR. No JVD or, murmurs or pedal edema Gastrointestinal system: quiet. Soft. Dressing in place. Appropriately tender Extremities: scds   Data Reviewed: Basic Metabolic Panel:  Recent Labs Lab 01/18/15 0323  01/19/15 1418 01/19/15 1458 01/20/15 0600 01/21/15 0415 01/22/15 0445 01/22/15 0831  NA 138  --   --   --  132* 133* 124*  --   K 3.6  --   --   --  3.9 4.0 5.4*  --   CL 108  --   --   --  102 105 98*  --   CO2 21*  --   --   --  21* 21* 20*  --   GLUCOSE 144*  --   --   --  165* 157* 782*  --   BUN 13  --   --   --  8 19 22*  --   CREATININE 1.03*  --   --   --  0.92 1.17* 1.12*  --   CALCIUM 8.9  --   --   --  8.6* 8.8* 8.4*  --   MG  --   < > 1.2* 1.2* 2.0 1.6* 2.3 1.9  PHOS  --   --  1.8*  --  5.8* 3.5 5.8* 3.1  < > = values in this interval not displayed. Liver Function Tests:  Recent Labs Lab 01/20/15 0600 01/21/15 0415 01/22/15 0445  AST 29 125* 32  ALT 25 78* 38  ALKPHOS 53 108 83  BILITOT 0.4 0.7 0.4  PROT 5.6* 4.9* 4.5*  ALBUMIN 2.7* 2.6* 2.1*   No results for input(s): LIPASE, AMYLASE in the last 168 hours. No results for input(s): AMMONIA in the last 168 hours. CBC:  Recent Labs Lab 01/18/15 0323 01/20/15 0600 01/21/15 1232 01/22/15 0445  WBC 6.7 6.7 13.0* 10.9*  NEUTROABS  --  4.4  --   --   HGB 10.7* 10.5* 8.8* 8.5*  HCT 31.4* 30.6* 25.3* 25.4*  MCV 93.7 92.7 93.4 96.2  PLT 258 256 182 170   Cardiac Enzymes: No results for input(s): CKTOTAL, CKMB, CKMBINDEX, TROPONINI in the last 168 hours. BNP (last 3 results) No results for input(s):  PROBNP in the last 8760 hours. CBG:  Recent Labs Lab 01/21/15 1537 01/21/15 2034 01/22/15 0138 01/22/15 0510 01/22/15 0820  GLUCAP 139* 143* 170* 151* 156*    No results found for this or any previous visit (from the past 240 hour(s)).       Studies: No results found.     Scheduled Meds: . cycloSPORINE  1 drop Both Eyes BID  . fluticasone  2 spray Each Nare Daily  . Influenza vac split quadrivalent PF  0.5 mL Intramuscular Tomorrow-1000  . insulin aspart  0-9 Units Subcutaneous 6 times per day  . latanoprost  1 drop Both Eyes QHS  . levothyroxine  112.5 mcg  Intravenous QAC breakfast  . lip balm   Topical BID  . mometasone-formoterol  2 puff Inhalation BID  . pantoprazole (PROTONIX) IV  40 mg Intravenous Q12H  . polyvinyl alcohol  1 drop Both Eyes QID   Continuous Infusions: . dextrose 5 % and 0.9 % NaCl with KCl 20 mEq/L 30 mL/hr at 01/21/15 2111  . Marland KitchenTPN (CLINIMIX-E) Adult 70 mL/hr at 01/21/15 1745   And  . fat emulsion 240 mL (01/21/15 1744)  . ropivacaine (PF) 2 mg/ml (0.2%) 8 mL/hr (01/21/15 2143)   Time spent: 25 min minutes.    Christiane Ha, MD [facility]  [email or web address] Password [email or web address] 01/22/2015, 10:42 AM    LOS: 12 days

## 2015-01-22 NOTE — Progress Notes (Deleted)
Pt transferred to 6 N room 28 via wheelchair.

## 2015-01-23 DIAGNOSIS — J690 Pneumonitis due to inhalation of food and vomit: Secondary | ICD-10-CM | POA: Diagnosis not present

## 2015-01-23 LAB — CBC WITH DIFFERENTIAL/PLATELET
Basophils Absolute: 0 10*3/uL (ref 0.0–0.1)
Basophils Relative: 0 %
Eosinophils Absolute: 0.7 10*3/uL (ref 0.0–0.7)
Eosinophils Relative: 7 %
HCT: 24.8 % — ABNORMAL LOW (ref 36.0–46.0)
Hemoglobin: 9 g/dL — ABNORMAL LOW (ref 12.0–15.0)
Lymphocytes Relative: 7 %
Lymphs Abs: 0.7 10*3/uL (ref 0.7–4.0)
MCH: 33.2 pg (ref 26.0–34.0)
MCHC: 36.3 g/dL — ABNORMAL HIGH (ref 30.0–36.0)
MCV: 91.5 fL (ref 78.0–100.0)
Monocytes Absolute: 1.2 10*3/uL — ABNORMAL HIGH (ref 0.1–1.0)
Monocytes Relative: 12 %
Neutro Abs: 7 10*3/uL (ref 1.7–7.7)
Neutrophils Relative %: 74 %
Platelets: 199 10*3/uL (ref 150–400)
RBC: 2.71 MIL/uL — ABNORMAL LOW (ref 3.87–5.11)
RDW: 14.6 % (ref 11.5–15.5)
WBC: 9.5 10*3/uL (ref 4.0–10.5)

## 2015-01-23 LAB — BASIC METABOLIC PANEL
Anion gap: 9 (ref 5–15)
BUN: 19 mg/dL (ref 6–20)
CO2: 20 mmol/L — ABNORMAL LOW (ref 22–32)
Calcium: 9 mg/dL (ref 8.9–10.3)
Chloride: 102 mmol/L (ref 101–111)
Creatinine, Ser: 0.83 mg/dL (ref 0.44–1.00)
GFR calc Af Amer: >60 mL/min (ref >60)
GFR calc non Af Amer: >60 mL/min (ref >60)
Glucose, Bld: 155 mg/dL — ABNORMAL HIGH (ref 65–99)
Potassium: 3.9 mmol/L (ref 3.5–5.1)
Sodium: 131 mmol/L — ABNORMAL LOW (ref 135–145)

## 2015-01-23 LAB — GLUCOSE, CAPILLARY
Glucose-Capillary: 126 mg/dL — ABNORMAL HIGH (ref 65–99)
Glucose-Capillary: 153 mg/dL — ABNORMAL HIGH (ref 65–99)
Glucose-Capillary: 109 mg/dL — ABNORMAL HIGH (ref 65–99)
Glucose-Capillary: 145 mg/dL — ABNORMAL HIGH (ref 65–99)
Glucose-Capillary: 167 mg/dL — ABNORMAL HIGH (ref 65–99)
Glucose-Capillary: 147 mg/dL — ABNORMAL HIGH (ref 65–99)

## 2015-01-23 LAB — URINE CULTURE: Culture: NO GROWTH

## 2015-01-23 MED ORDER — ENALAPRILAT 1.25 MG/ML IV SOLN
1.2500 mg | Freq: Four times a day (QID) | INTRAVENOUS | Status: DC
  Administered 2015-01-23 – 2015-01-24 (×3): 1.25 mg via INTRAVENOUS
  Filled 2015-01-23 (×7): qty 1

## 2015-01-23 MED ORDER — FAT EMULSION 20 % IV EMUL
240.0000 mL | INTRAVENOUS | Status: AC
  Administered 2015-01-23: 240 mL via INTRAVENOUS
  Filled 2015-01-23: qty 250

## 2015-01-23 MED ORDER — TRACE MINERALS CR-CU-MN-SE-ZN 10-1000-500-60 MCG/ML IV SOLN
INTRAVENOUS | Status: AC
  Administered 2015-01-23: 18:00:00 via INTRAVENOUS
  Filled 2015-01-23: qty 1680

## 2015-01-23 NOTE — Progress Notes (Signed)
Anesthesiology Note:  81 year old female 3 days S/P exploratory lap and lysis of adhesions for SBO with epidural ropivacaine 0.2% at 10 cc/hr. Having breakthrough pain, requiring 1-2 mg morphine every 6-8 hours.  VS: T-37.4 BP- 177/66 RR-21 HR- 72 O2 Sat 100% on RA.  Will continue epidural infusion today, wean as tolerated and d/c next 1-2 days.  Roberts Gaudy

## 2015-01-23 NOTE — Progress Notes (Signed)
PROGRESS NOTE    Shannon [name]:[id number] DOB: 06-22-1923 DOA: 01/10/2015 PCP: Kirt Boys, DO  HPI/Brief narrative 81 y.o. female with history of remote gastrojejunostomy & suspected vagotomy, status post GJ anastomosis resection and GJ revision after perforation of anastomotic ulcer 09/2013, G00 01/2014 with EGD 2: #1 with no anatomic outlet obstruction and normal looking pylorus/duodenum, #2: To breakup bezoar, CT 03/12/14: Recurrent bezoar, thickened pyloric channel, EGD 03/19/14: Tortuous/narrowed anatomy at prepylorus/pylorus but no stricture or ulcer, UGI/SBFT 03/25/14: Normal, CT scan 05/21/14: Gastric distention, stomach filled with fluid/food and thickened pyloric wall, barium swallow 05/22/14: Small esophageal hernia, barium trickled into duodenum, eventually assessed to have functional gastric outlet obstruction (follows with [facility]) presented with 4 day history of progressively worsening abdominal pain. Last BM-small one on 10/8, no flatus since 4 days. In ED, CT abdomen suggestive of PSBO versus ileus. Multiple (3) attempts to place NG tube on floor unsuccessful. IR consulted for fluoroscopic guided NG tube placement. CCS consulted. She had teeth pulled on Wed this week, pain control was with Tylenol 3 but she stopped this after Wed. patient was treated conservatively with bowel rest, NG tube and IV fluids despite which her SBO has not resolved. Patient has repeatedly declined surgery. Palliative care was consulted but family have asked them not to see patient and they signed off 10/14. Eventually agreed to surgery and Had ex lap, LOA 10/19  Assessment/Plan:  SBO  S/p ex lap LOA. NG out. Started on clears. Getting TPN via PICC. Continue SDU for another 24 hours.  Essential hypertension  Creeping up. Increase vasotec.  Start po meds when tolerating good PO  Fever: CXR shows possible pneumonia. UA ok. BC pending. Started on vancomycin and fortaz.  Type II  DM controlled  Hypothyroid - IV Synthroid  Abnormal labs: likely erroneous. Awaiting repeat  Stage III chronic kidney disease - Creatinine is at baseline  History of CVA  Anemia - stable  Elevated troponin Echo without WMA. EF normal. Cardiology signed off. On ASA, beta blocker  Tachycardia: metoprolol held for bradycardia 10/20  Severe malnutrition in context of chronic illness On TPN  Anemia and blood tinged NG aspirate: stables  DVT prophylaxis: Subcutaneous heparin  Code Status: DO NOT RESUSCITATE. Family Communication: discussed with daughter Shannon [name]  10/19 Disposition Plan:    Consultants:  Gen. Surgery   Palliative-signed off 10/14  Cardiology-signed off  Procedures:  NG tube  2D Echo: Echo Left ventricle: The cavity size was normal. Systolic function was normal. The estimated ejection fraction was in the range of 55% to 60%. Wall motion was normal; there were no regional wall motion abnormalities. - Aortic valve: There was mild to moderate regurgitation. Regurgitation pressure half-time: 365 ms. - Left atrium: The atrium was severely dilated. - Right ventricle: The cavity size was normal. Wall thickness was normal. Systolic function was normal. - Tricuspid valve: There was mild regurgitation. - Pulmonary arteries: PA peak pressure: 42 mm Hg (S). - Inferior vena cava: The vessel was normal in size. The respirophasic diameter changes were in the normal range (>= 50%), consistent with normal central venous pressure.   Antibiotics:  None    Subjective: No complaints. Per RN, cough -->gray sputum  Objective: Filed Vitals:   01/23/15 0700 01/23/15 0748 01/23/15 0900 01/23/15 1149  BP:   174/63   Pulse:   74   Temp: 99.4 F (37.4 C)   99.3 F (37.4 C)  TempSrc: Oral   Oral  Resp:  22   Height:      Weight:      SpO2:  100% 100%     Intake/Output Summary (Last 24 hours) at 01/23/15 1204 Last data filed at  01/23/15 1149  Gross per 24 hour  Intake 3207.17 ml  Output   2900 ml  Net 307.17 ml   Filed Weights   01/12/15 1123  Weight: 57.97 kg (127 lb 12.8 oz)     Exam:  General exam: alert. cooperative Respiratory system: Clear to auscultation. No increased work of breathing. Cardiovascular system: S1 and S2 heard, RRR. No JVD or, murmurs or pedal edema Gastrointestinal system: quiet. Soft. Dressing in place. Appropriately tender Extremities: scds   Data Reviewed: Basic Metabolic Panel:  Recent Labs Lab 01/19/15 1418 01/19/15 1458 01/20/15 0600 01/21/15 0415 01/22/15 0445 01/22/15 0831 01/23/15 0522  NA  --   --  132* 133* 124* 133* 131*  K  --   --  3.9 4.0 5.4* 4.1 3.9  CL  --   --  102 105 98* 106 102  CO2  --   --  21* 21* 20* 21* 20*  GLUCOSE  --   --  165* 157* 782* 158* 155*  BUN  --   --  8 19 22* 23* 19  CREATININE  --   --  0.92 1.17* 1.12* 1.05* 0.83  CALCIUM  --   --  8.6* 8.8* 8.4* 8.7* 9.0  MG 1.2* 1.2* 2.0 1.6* 2.3 1.9  --   PHOS 1.8*  --  5.8* 3.5 5.8* 3.1  --    Liver Function Tests:  Recent Labs Lab 01/20/15 0600 01/21/15 0415 01/22/15 0445 01/22/15 0831  AST 29 125* 32 27  ALT 25 78* 38 38  ALKPHOS 53 108 83 93  BILITOT 0.4 0.7 0.4 0.3  PROT 5.6* 4.9* 4.5* 5.0*  ALBUMIN 2.7* 2.6* 2.1* 2.3*   No results for input(s): LIPASE, AMYLASE in the last 168 hours. No results for input(s): AMMONIA in the last 168 hours. CBC:  Recent Labs Lab 01/18/15 0323 01/20/15 0600 01/21/15 1232 01/22/15 0445 01/23/15 0522  WBC 6.7 6.7 13.0* 10.9* 9.5  NEUTROABS  --  4.4  --   --  7.0  HGB 10.7* 10.5* 8.8* 8.5* 9.0*  HCT 31.4* 30.6* 25.3* 25.4* 24.8*  MCV 93.7 92.7 93.4 96.2 91.5  PLT 258 256 182 170 199   Cardiac Enzymes: No results for input(s): CKTOTAL, CKMB, CKMBINDEX, TROPONINI in the last 168 hours. BNP (last 3 results) No results for input(s): PROBNP in the last 8760 hours. CBG:  Recent Labs Lab 01/22/15 1544 01/22/15 1926  01/22/15 2307 01/23/15 0312 01/23/15 0727  GLUCAP 131* 142* 126* 153* 109*    Recent Results (from the past 240 hour(s))  Culture, blood (routine x 2)     Status: None (Preliminary result)   Collection Time: 01/22/15 12:14 PM  Result Value Ref Range Status   Specimen Description BLOOD RIGHT ANTECUBITAL  Final   Special Requests BOTTLES DRAWN AEROBIC AND ANAEROBIC 10CC  Final   Culture NO GROWTH < 24 HOURS  Final   Report Status PENDING  Incomplete  Culture, blood (routine x 2)     Status: None (Preliminary result)   Collection Time: 01/22/15 12:28 PM  Result Value Ref Range Status   Specimen Description BLOOD BLOOD RIGHT HAND  Final   Special Requests   Final    BOTTLES DRAWN AEROBIC AND ANAEROBIC 10CC BLUE, 5CC RED   Culture NO GROWTH <  24 HOURS  Final   Report Status PENDING  Incomplete  MRSA PCR Screening     Status: Abnormal   Collection Time: 01/22/15  4:12 PM  Result Value Ref Range Status   MRSA by PCR POSITIVE (A) NEGATIVE Final    Comment:        The GeneXpert MRSA Assay (FDA approved for NASAL specimens only), is one component of a comprehensive MRSA colonization surveillance program. It is not intended to diagnose MRSA infection nor to guide or monitor treatment for MRSA infections. RESULT CALLED TO, READ BACK BY AND VERIFIED WITH: S.DOTY,RN AT 1832 BY L.PITT 01/22/15          Studies: Dg Chest Port 1 View  01/22/2015  CLINICAL DATA:  Cough and fever.  Small bowel obstruction. EXAM: PORTABLE CHEST 1 VIEW COMPARISON:  03/27/2014 FINDINGS: A left subclavian central venous catheter is seen with tip overlying the mid to distal SVC. No evidence of pneumothorax. Nasogastric tube seen entering the stomach. New bibasilar pulmonary opacity may be due to atelectasis or infiltrates. Mild left pleural thickening versus small pleural effusion remains stable. Cardiomegaly is also stable. IMPRESSION: Support apparatus in appropriate position. No evidence of pneumothorax.  New bibasilar infiltrates versus atelectasis. Stable mild left pleural thickening versus effusion. Stable cardiomegaly. Electronically Signed   By: Myles Rosenthal M.D.   On: 01/22/2015 13:06       Scheduled Meds: . cefTAZidime (FORTAZ)  IV  1 g Intravenous Q12H  . Chlorhexidine Gluconate Cloth  6 each Topical Q0600  . cycloSPORINE  1 drop Both Eyes BID  . enalaprilat  0.625 mg Intravenous 4 times per day  . fluticasone  2 spray Each Nare Daily  . Influenza vac split quadrivalent PF  0.5 mL Intramuscular Tomorrow-1000  . insulin aspart  0-9 Units Subcutaneous 6 times per day  . latanoprost  1 drop Both Eyes QHS  . levothyroxine  112.5 mcg Intravenous QAC breakfast  . lip balm   Topical BID  . mometasone-formoterol  2 puff Inhalation BID  . mupirocin cream   Topical BID  . pantoprazole (PROTONIX) IV  40 mg Intravenous Q12H  . polyvinyl alcohol  1 drop Both Eyes QID  . vancomycin  750 mg Intravenous Q24H   Continuous Infusions: . dextrose 5 % and 0.9 % NaCl with KCl 20 mEq/L 30 mL/hr at 01/21/15 2111  . Marland KitchenTPN (CLINIMIX-E) Adult 70 mL/hr at 01/22/15 1751   And  . fat emulsion 240 mL (01/22/15 1751)  . Marland KitchenTPN (CLINIMIX-E) Adult     And  . fat emulsion    . ropivacaine (PF) 2 mg/ml (0.2%) 10 mL/hr (01/22/15 2248)   Time spent: 25 min minutes.    Christiane Ha, MD [facility]  [email or web address] Password [email or web address] 01/23/2015, 12:04 PM    LOS: 13 days

## 2015-01-23 NOTE — Progress Notes (Signed)
RN called Dr. Linna Caprice regarding clarification of note to wean ropivacaine. Dr. Linna Caprice stated to wean to 8 ml/ hr and if she tolerates, wean down to 6 ml/hr.

## 2015-01-23 NOTE — Progress Notes (Signed)
Patient ID: Shannon [name], female   DOB: [birth year], 81 y.o.   MRN: [id number]     [facility]      [address]   [place], [place] [address]    Phone: [phone] FAX: [phone]     Subjective: Passing flatus.  NGT is bothersome.    Objective:  Vital signs:  Filed Vitals:   01/23/15 0606 01/23/15 0700 01/23/15 0748 01/23/15 0900  BP: 178/57   174/63  Pulse:    74  Temp:  99.4 F (37.4 C)    TempSrc:  Oral    Resp:    22  Height:      Weight:      SpO2:   100% 100%    Last BM Date: 01/19/15  Intake/Output   Yesterday:  10/21 0701 - 10/22 0700 In: 3237.2 [I.V.:720; NG/GT:105; IV Piggyback:250; TPN:1920.2] Out: 2725 [Urine:2725] This shift:     Physical Exam: General: Pt awake/alert/oriented x4 in no acute distress  Abdomen: Soft. Nondistended. Mildly tender at incisions only. Incision is c/d/i, staples in place, no erythema. No evidence of peritonitis. No incarcerated hernias.  Problem List:   Principal Problem:   SBO (small bowel obstruction) (HCC) Active Problems:   AP (abdominal pain)   SIADH (syndrome of inappropriate ADH production) (HCC)   Protein-calorie malnutrition, severe   Elevated troponin   HTN (hypertension), uncontrolled   Palliative care encounter   DNR (do not resuscitate) discussion   Bradycardia   Hypotension   Fever, unspecified    Results:   Labs: Results for orders placed or performed during the hospital encounter of 01/10/15 (from the past 48 hour(s))  Glucose, capillary     Status: Abnormal   Collection Time: 01/21/15 11:58 AM  Result Value Ref Range   Glucose-Capillary 135 (H) 65 - 99 mg/dL   Comment 1 Notify RN    Comment 2 Document in Chart   CBC     Status: Abnormal   Collection Time: 01/21/15 12:32 PM  Result Value Ref Range   WBC 13.0 (H) 4.0 - 10.5 K/uL   RBC 2.71 (L) 3.87 - 5.11 MIL/uL   Hemoglobin 8.8 (L) 12.0 - 15.0 g/dL   HCT 56.3 (L) 87.5 - 64.3 %   MCV  93.4 78.0 - 100.0 fL   MCH 32.5 26.0 - 34.0 pg   MCHC 34.8 30.0 - 36.0 g/dL   RDW 32.9 51.8 - 84.1 %   Platelets 182 150 - 400 K/uL  Type and screen [facility]     Status: None (Preliminary result)   Collection Time: 01/21/15 12:32 PM  Result Value Ref Range   ABO/RH(D) AB POS    Antibody Screen POS    Sample Expiration 01/24/2015    Antibody Identification ANTI E    PT AG Type NEGATIVE FOR E ANTIGEN NEGATIVE FOR KELL ANTIGEN    DAT, IgG NEG    Unit Number [id number]    Blood Component Type RED CELLS,LR    Unit division 00    Status of Unit ALLOCATED    Transfusion Status OK TO TRANSFUSE    Crossmatch Result COMPATIBLE    Unit Number [id number]    Blood Component Type RED CELLS,LR    Unit division 00    Status of Unit ALLOCATED    Transfusion Status OK TO TRANSFUSE    Crossmatch Result COMPATIBLE   Glucose, capillary     Status: Abnormal   Collection Time: 01/21/15  3:37 PM  Result Value Ref Range   Glucose-Capillary 139 (H) 65 - 99 mg/dL  Glucose, capillary     Status: Abnormal   Collection Time: 01/21/15  8:34 PM  Result Value Ref Range   Glucose-Capillary 143 (H) 65 - 99 mg/dL  Glucose, capillary     Status: Abnormal   Collection Time: 01/22/15  1:38 AM  Result Value Ref Range   Glucose-Capillary 170 (H) 65 - 99 mg/dL  Magnesium     Status: None   Collection Time: 01/22/15  4:45 AM  Result Value Ref Range   Magnesium 2.3 1.7 - 2.4 mg/dL  Phosphorus     Status: Abnormal   Collection Time: 01/22/15  4:45 AM  Result Value Ref Range   Phosphorus 5.8 (H) 2.5 - 4.6 mg/dL  CBC     Status: Abnormal   Collection Time: 01/22/15  4:45 AM  Result Value Ref Range   WBC 10.9 (H) 4.0 - 10.5 K/uL   RBC 2.64 (L) 3.87 - 5.11 MIL/uL   Hemoglobin 8.5 (L) 12.0 - 15.0 g/dL   HCT 16.1 (L) 09.6 - 04.5 %   MCV 96.2 78.0 - 100.0 fL   MCH 32.2 26.0 - 34.0 pg   MCHC 33.5 30.0 - 36.0 g/dL   RDW 40.9 81.1 - 91.4 %   Platelets 170 150 - 400 K/uL  Comprehensive  metabolic panel     Status: Abnormal   Collection Time: 01/22/15  4:45 AM  Result Value Ref Range   Sodium 124 (L) 135 - 145 mmol/L    Comment: DELTA CHECK NOTED   Potassium 5.4 (H) 3.5 - 5.1 mmol/L    Comment: DELTA CHECK NOTED   Chloride 98 (L) 101 - 111 mmol/L   CO2 20 (L) 22 - 32 mmol/L   Glucose, Bld 782 (HH) 65 - 99 mg/dL    Comment: CRITICAL RESULT CALLED TO, READ BACK BY AND VERIFIED WITH: USSERY,K RN 01/22/2015 0612 JORDANS    BUN 22 (H) 6 - 20 mg/dL   Creatinine, Ser 7.82 (H) 0.44 - 1.00 mg/dL   Calcium 8.4 (L) 8.9 - 10.3 mg/dL   Total Protein 4.5 (L) 6.5 - 8.1 g/dL   Albumin 2.1 (L) 3.5 - 5.0 g/dL   AST 32 15 - 41 U/L   ALT 38 14 - 54 U/L   Alkaline Phosphatase 83 38 - 126 U/L   Total Bilirubin 0.4 0.3 - 1.2 mg/dL   GFR calc non Af Amer 42 (L) >60 mL/min   GFR calc Af Amer 49 (L) >60 mL/min    Comment: (NOTE) The eGFR has been calculated using the CKD EPI equation. This calculation has not been validated in all clinical situations. eGFR's persistently <60 mL/min signify possible Chronic Kidney Disease.    Anion gap 6 5 - 15  Glucose, capillary     Status: Abnormal   Collection Time: 01/22/15  5:10 AM  Result Value Ref Range   Glucose-Capillary 151 (H) 65 - 99 mg/dL  Glucose, capillary     Status: Abnormal   Collection Time: 01/22/15  8:20 AM  Result Value Ref Range   Glucose-Capillary 156 (H) 65 - 99 mg/dL   Comment 1 Notify RN    Comment 2 Document in Chart   Comprehensive metabolic panel     Status: Abnormal   Collection Time: 01/22/15  8:31 AM  Result Value Ref Range   Sodium 133 (L) 135 - 145 mmol/L   Potassium 4.1 3.5 - 5.1 mmol/L   Chloride  106 101 - 111 mmol/L   CO2 21 (L) 22 - 32 mmol/L   Glucose, Bld 158 (H) 65 - 99 mg/dL   BUN 23 (H) 6 - 20 mg/dL   Creatinine, Ser 2.84 (H) 0.44 - 1.00 mg/dL   Calcium 8.7 (L) 8.9 - 10.3 mg/dL   Total Protein 5.0 (L) 6.5 - 8.1 g/dL   Albumin 2.3 (L) 3.5 - 5.0 g/dL   AST 27 15 - 41 U/L   ALT 38 14 - 54 U/L    Alkaline Phosphatase 93 38 - 126 U/L   Total Bilirubin 0.3 0.3 - 1.2 mg/dL   GFR calc non Af Amer 46 (L) >60 mL/min   GFR calc Af Amer 53 (L) >60 mL/min    Comment: (NOTE) The eGFR has been calculated using the CKD EPI equation. This calculation has not been validated in all clinical situations. eGFR's persistently <60 mL/min signify possible Chronic Kidney Disease.    Anion gap 6 5 - 15  Magnesium     Status: None   Collection Time: 01/22/15  8:31 AM  Result Value Ref Range   Magnesium 1.9 1.7 - 2.4 mg/dL  Phosphorus     Status: None   Collection Time: 01/22/15  8:31 AM  Result Value Ref Range   Phosphorus 3.1 2.5 - 4.6 mg/dL  Urinalysis, Routine w reflex microscopic (not at [facility])     Status: Abnormal   Collection Time: 01/22/15 10:35 AM  Result Value Ref Range   Color, Urine YELLOW YELLOW   APPearance CLEAR CLEAR   Specific Gravity, Urine 1.014 1.005 - 1.030   pH 6.0 5.0 - 8.0   Glucose, UA NEGATIVE NEGATIVE mg/dL   Hgb urine dipstick TRACE (A) NEGATIVE   Bilirubin Urine NEGATIVE NEGATIVE   Ketones, ur NEGATIVE NEGATIVE mg/dL   Protein, ur 30 (A) NEGATIVE mg/dL   Urobilinogen, UA 1.0 0.0 - 1.0 mg/dL   Nitrite NEGATIVE NEGATIVE   Leukocytes, UA NEGATIVE NEGATIVE  Urine microscopic-add on     Status: None   Collection Time: 01/22/15 10:35 AM  Result Value Ref Range   RBC / HPF 0-2 <3 RBC/hpf   Urine-Other LESS THAN 10 mL OF URINE SUBMITTED   Glucose, capillary     Status: Abnormal   Collection Time: 01/22/15 12:09 PM  Result Value Ref Range   Glucose-Capillary 140 (H) 65 - 99 mg/dL   Comment 1 Notify RN    Comment 2 Document in Chart   Culture, blood (routine x 2)     Status: None (Preliminary result)   Collection Time: 01/22/15 12:14 PM  Result Value Ref Range   Specimen Description BLOOD RIGHT ANTECUBITAL    Special Requests BOTTLES DRAWN AEROBIC AND ANAEROBIC 10CC    Culture NO GROWTH < 24 HOURS    Report Status PENDING   Culture, blood (routine x 2)     Status:  None (Preliminary result)   Collection Time: 01/22/15 12:28 PM  Result Value Ref Range   Specimen Description BLOOD BLOOD RIGHT HAND    Special Requests      BOTTLES DRAWN AEROBIC AND ANAEROBIC 10CC BLUE, 5CC RED   Culture NO GROWTH < 24 HOURS    Report Status PENDING   Glucose, capillary     Status: Abnormal   Collection Time: 01/22/15  3:44 PM  Result Value Ref Range   Glucose-Capillary 131 (H) 65 - 99 mg/dL  MRSA PCR Screening     Status: Abnormal   Collection Time: 01/22/15  4:12 PM  Result Value Ref Range   MRSA by PCR POSITIVE (A) NEGATIVE    Comment:        The GeneXpert MRSA Assay (FDA approved for NASAL specimens only), is one component of a comprehensive MRSA colonization surveillance program. It is not intended to diagnose MRSA infection nor to guide or monitor treatment for MRSA infections. RESULT CALLED TO, READ BACK BY AND VERIFIED WITH: S.DOTY,RN AT 1832 BY L.PITT 01/22/15   Glucose, capillary     Status: Abnormal   Collection Time: 01/22/15  7:26 PM  Result Value Ref Range   Glucose-Capillary 142 (H) 65 - 99 mg/dL   Comment 1 Notify RN   Glucose, capillary     Status: Abnormal   Collection Time: 01/22/15 11:07 PM  Result Value Ref Range   Glucose-Capillary 126 (H) 65 - 99 mg/dL   Comment 1 Notify RN   Glucose, capillary     Status: Abnormal   Collection Time: 01/23/15  3:12 AM  Result Value Ref Range   Glucose-Capillary 153 (H) 65 - 99 mg/dL   Comment 1 Notify RN   Basic metabolic panel     Status: Abnormal   Collection Time: 01/23/15  5:22 AM  Result Value Ref Range   Sodium 131 (L) 135 - 145 mmol/L   Potassium 3.9 3.5 - 5.1 mmol/L   Chloride 102 101 - 111 mmol/L   CO2 20 (L) 22 - 32 mmol/L   Glucose, Bld 155 (H) 65 - 99 mg/dL   BUN 19 6 - 20 mg/dL   Creatinine, Ser 1.61 0.44 - 1.00 mg/dL   Calcium 9.0 8.9 - 09.6 mg/dL   GFR calc non Af Amer >60 >60 mL/min   GFR calc Af Amer >60 >60 mL/min    Comment: (NOTE) The eGFR has been calculated using  the CKD EPI equation. This calculation has not been validated in all clinical situations. eGFR's persistently <60 mL/min signify possible Chronic Kidney Disease.    Anion gap 9 5 - 15  CBC with Differential/Platelet     Status: Abnormal   Collection Time: 01/23/15  5:22 AM  Result Value Ref Range   WBC 9.5 4.0 - 10.5 K/uL   RBC 2.71 (L) 3.87 - 5.11 MIL/uL   Hemoglobin 9.0 (L) 12.0 - 15.0 g/dL   HCT 04.5 (L) 40.9 - 81.1 %   MCV 91.5 78.0 - 100.0 fL   MCH 33.2 26.0 - 34.0 pg   MCHC 36.3 (H) 30.0 - 36.0 g/dL   RDW 91.4 78.2 - 95.6 %   Platelets 199 150 - 400 K/uL   Neutrophils Relative % 74 %   Neutro Abs 7.0 1.7 - 7.7 K/uL   Lymphocytes Relative 7 %   Lymphs Abs 0.7 0.7 - 4.0 K/uL   Monocytes Relative 12 %   Monocytes Absolute 1.2 (H) 0.1 - 1.0 K/uL   Eosinophils Relative 7 %   Eosinophils Absolute 0.7 0.0 - 0.7 K/uL   Basophils Relative 0 %   Basophils Absolute 0.0 0.0 - 0.1 K/uL  Glucose, capillary     Status: Abnormal   Collection Time: 01/23/15  7:27 AM  Result Value Ref Range   Glucose-Capillary 109 (H) 65 - 99 mg/dL    Imaging / Studies: Dg Chest Port 1 View  01/22/2015  CLINICAL DATA:  Cough and fever.  Small bowel obstruction. EXAM: PORTABLE CHEST 1 VIEW COMPARISON:  03/27/2014 FINDINGS: A left subclavian central venous catheter is seen with tip overlying the mid  to distal SVC. No evidence of pneumothorax. Nasogastric tube seen entering the stomach. New bibasilar pulmonary opacity may be due to atelectasis or infiltrates. Mild left pleural thickening versus small pleural effusion remains stable. Cardiomegaly is also stable. IMPRESSION: Support apparatus in appropriate position. No evidence of pneumothorax. New bibasilar infiltrates versus atelectasis. Stable mild left pleural thickening versus effusion. Stable cardiomegaly. Electronically Signed   By: Myles Rosenthal M.D.   On: 01/22/2015 13:06    Medications / Allergies:  Scheduled Meds: . cefTAZidime (FORTAZ)  IV  1 g  Intravenous Q12H  . Chlorhexidine Gluconate Cloth  6 each Topical Q0600  . cycloSPORINE  1 drop Both Eyes BID  . enalaprilat  0.625 mg Intravenous 4 times per day  . fluticasone  2 spray Each Nare Daily  . Influenza vac split quadrivalent PF  0.5 mL Intramuscular Tomorrow-1000  . insulin aspart  0-9 Units Subcutaneous 6 times per day  . latanoprost  1 drop Both Eyes QHS  . levothyroxine  112.5 mcg Intravenous QAC breakfast  . lip balm   Topical BID  . mometasone-formoterol  2 puff Inhalation BID  . mupirocin cream   Topical BID  . pantoprazole (PROTONIX) IV  40 mg Intravenous Q12H  . polyvinyl alcohol  1 drop Both Eyes QID  . vancomycin  750 mg Intravenous Q24H   Continuous Infusions: . dextrose 5 % and 0.9 % NaCl with KCl 20 mEq/L 30 mL/hr at 01/21/15 2111  . Marland KitchenTPN (CLINIMIX-E) Adult 70 mL/hr at 01/22/15 1751   And  . fat emulsion 240 mL (01/22/15 1751)  . Marland KitchenTPN (CLINIMIX-E) Adult     And  . fat emulsion    . ropivacaine (PF) 2 mg/ml (0.2%) 10 mL/hr (01/22/15 2248)   PRN Meds:.acetaminophen, albuterol, alum & mag hydroxide-simeth, dextrose, diphenhydrAMINE, hydrALAZINE, iohexol, magic mouthwash, menthol-cetylpyridinium, metoprolol, morphine injection, nitroGLYCERIN, phenol, promethazine, sodium chloride, sodium chloride  Antibiotics: Anti-infectives    Start     Dose/Rate Route Frequency Ordered Stop   01/22/15 2000  vancomycin (VANCOCIN) IVPB 750 mg/150 ml premix     750 mg 150 mL/hr over 60 Minutes Intravenous Every 24 hours 01/22/15 1707     01/22/15 1800  cefTAZidime (FORTAZ) 1 g in dextrose 5 % 50 mL IVPB     1 g 100 mL/hr over 30 Minutes Intravenous Every 12 hours 01/22/15 1707     01/20/15 0600  ceFAZolin (ANCEF) IVPB 2 g/50 mL premix     2 g 100 mL/hr over 30 Minutes Intravenous On call to O.R. 01/19/15 1251 01/20/15 1151   01/10/15 2200  amoxicillin (AMOXIL) 250 MG/5ML suspension 250 mg  Status:  Discontinued     250 mg Oral Every 12 hours 01/10/15 1703 01/10/15 1704    01/10/15 2200  amoxicillin (AMOXIL) 250 MG/5ML suspension 250 mg  Status:  Discontinued     250 mg Per Tube Every 12 hours 01/10/15 1704 01/10/15 1725   01/10/15 1730  amoxicillin (AMOXIL) 250 MG/5ML suspension 500 mg  Status:  Discontinued     500 mg Per Tube 3 times per day 01/10/15 1725 01/13/15 1333       Assessment/Plan SBO POD#3 exploratory laparotomy, LOA---Dr. Magnus Ivan -DC NGT and continue with clears -epidural per anesthesia -leave midline incision OTA -mobilize and pulmonary toilet(pulling IS) FEN-slowly advance diet PCM-TPN VTE prophylaxis-SCD, may resume heparin from a surgical standpoint.  ID-fortaz/vanc for suspected PNA Dispo-SDU   Ashok Norris, ANP-BC [facility]   01/23/2015 10:38 AM

## 2015-01-23 NOTE — Progress Notes (Signed)
PARENTERAL NUTRITION CONSULT NOTE  Pharmacy Consult for TPN Indication: SBO  No Known Allergies  Patient Measurements: Height: _0  (167.6 cm) (taken from 11/06/14) Weight: 127 lb 12.8 oz (57.97 kg) IBW/kg (Calculated) : 59.3  Vital Signs: Temp: 98.2 F (36.8 C) (10/22 0314) Temp Source: Oral (10/22 0314) BP: 178/57 mmHg (10/22 0606) Pulse Rate: 90 (10/22 0314) Intake/Output from previous day: 10/21 0701 - 10/22 0700 In: 3237.2 [I.V.:720; NG/GT:105; IV Piggyback:250; TPN:1920.2] Out: 2025 [Urine:2025] Intake/Output from this shift:    Labs:  Recent Labs  01/20/15 1000 01/21/15 1232 01/22/15 0445 01/23/15 0522  WBC  --  13.0* 10.9* 9.5  HGB  --  8.8* 8.5* 9.0*  HCT  --  25.3* 25.4* 24.8*  PLT  --  182 170 199  INR 1.11  --   --   --      Recent Labs  01/21/15 0415 01/22/15 0445 01/22/15 0831 01/23/15 0522  NA 133* 124* 133* 131*  K 4.0 5.4* 4.1 3.9  CL 105 98* 106 102  CO2 21* 20* 21* 20*  GLUCOSE 157* 782* 158* 155*  BUN 19 22* 23* 19  CREATININE 1.17* 1.12* 1.05* 0.83  CALCIUM 8.8* 8.4* 8.7* 9.0  MG 1.6* 2.3 1.9  --   PHOS 3.5 5.8* 3.1  --   PROT 4.9* 4.5* 5.0*  --   ALBUMIN 2.6* 2.1* 2.3*  --   AST 125* 32 27  --   ALT 78* 38 38  --   ALKPHOS 108 83 93  --   BILITOT 0.7 0.4 0.3  --    Estimated Creatinine Clearance: 42.1 mL/min (by C-G formula based on Cr of 0.83).    Recent Labs  01/22/15 1926 01/22/15 2307 01/23/15 0312  GLUCAP 142* 126* 153*    Insulin Requirements in the past 24 hours:  3units on SSI q4h  Assessment: 73 yof with hx of gastric outlet obstruction, SBO, sequela of perforated pyloric/duodenal ulcer in 2015. Presenting with 4 day history of abdominal pain, progressively worsening,LBM on 10/8 pta, no flatus x 4 days pta on admit. CT suggests SBO vs. Ileus. Pt repeatedly declining surgery and palliative consulted but family declined. NPO since 01/10/15. Pharmacy consulted to start TPN for pSBO. Scheduled for surgery on  10/19. Severe malnutrition in setting of chronic illness noted in RD assessment. Pt at high risk of refeeding syndrome.  Surgeries/Procedures: 10/19: ex-lap w/ LOA  GI: Prealbumin 12.4. high-grade SBO not resolving. Palliative declined, opted for surgery. S/p surgery on 10/19 after failing conservative management. NGT clamped. LBM 10/18. PPI  Endo: T2DM. Hypothyroid - synthroid. CBGs 126-153 on SSI q4h. No meds, no insulin pta  Lytes: K 3.9, Phos 3.1, Mg 1.9, CoCa 10.4  Renal: CKD3. SCr 1.05, CrCl~30, UOP 1.1m/kg/h. D5NSw/20K_1 ,   Pulm: Dulera - 100% on RA  Cards: HTN. Amlodipine, asa81, vastotec, hydralazine, lopressor  Hepatobil: tbili/alk phos wnl but increased, TG 91, alb 2.3, monitor LFT increase AST 27 ALT38 Tbili 0.3  Neuro: Hx CVA. Sertraline. Hydromorphone + morphine post-surgery  ID: Tmax 99.6, wbc 9.5 - antibiotics started 10/21 due to fever.   10/21 vancomycin>> 10/21 ceftazidime>>  10/21 Blood x 2>> 10/21 MRSA pcr Positive 10/21 Urine   Best Practices: hep [place]  TPN Access: PICC 10/18>> TPN start date: 01/19/15>>  Current Nutrition:  Clinimix E 5/15 _2  ml/h + 20% IVFE _3  ml/h - provides 1672kcal + 84g protein daily  Nutritional Goals: per RD assessment 10/18 1500-1700 kCal, 75-90 grams of protein per day  Plan:  - Continue Clinimix E 5/15 @ 70 ml/h + 20% IVFE _0  ml/h (has been advanced slowly due to refeeding risk) - TPN will provide 1672kcal + 84g protein daily (meeting 100% of kcal/protein needs) - SSI q4h and monitor CBGs - F/u AM labs - F/u plans to wean TPN as appropriate  Heide Guile, PharmD, BCPS-AQ ID Clinical Pharmacist Pager [phone]   01/23/2015 7:13 AM

## 2015-01-24 LAB — GLUCOSE, CAPILLARY
Glucose-Capillary: 134 mg/dL — ABNORMAL HIGH (ref 65–99)
Glucose-Capillary: 138 mg/dL — ABNORMAL HIGH (ref 65–99)
Glucose-Capillary: 146 mg/dL — ABNORMAL HIGH (ref 65–99)
Glucose-Capillary: 155 mg/dL — ABNORMAL HIGH (ref 65–99)
Glucose-Capillary: 160 mg/dL — ABNORMAL HIGH (ref 65–99)
Glucose-Capillary: 185 mg/dL — ABNORMAL HIGH (ref 65–99)

## 2015-01-24 MED ORDER — METOPROLOL SUCCINATE ER 25 MG PO TB24
25.0000 mg | ORAL_TABLET | Freq: Every day | ORAL | Status: DC
  Administered 2015-01-24 – 2015-01-27 (×4): 25 mg via ORAL
  Filled 2015-01-24 (×4): qty 1

## 2015-01-24 MED ORDER — CLONIDINE HCL 0.1 MG PO TABS
0.1000 mg | ORAL_TABLET | Freq: Two times a day (BID) | ORAL | Status: DC
  Administered 2015-01-24 – 2015-01-27 (×7): 0.1 mg via ORAL
  Filled 2015-01-24 (×8): qty 1

## 2015-01-24 MED ORDER — FAT EMULSION 20 % IV EMUL
240.0000 mL | INTRAVENOUS | Status: DC
  Administered 2015-01-24: 240 mL via INTRAVENOUS
  Filled 2015-01-24: qty 250

## 2015-01-24 MED ORDER — LEVOTHYROXINE SODIUM 100 MCG PO TABS
100.0000 ug | ORAL_TABLET | Freq: Every day | ORAL | Status: AC
  Filled 2015-01-24: qty 1

## 2015-01-24 MED ORDER — RESOURCE THICKENUP CLEAR PO POWD
ORAL | Status: DC | PRN
  Filled 2015-01-24: qty 125

## 2015-01-24 MED ORDER — INSULIN ASPART 100 UNIT/ML ~~LOC~~ SOLN
0.0000 [IU] | Freq: Three times a day (TID) | SUBCUTANEOUS | Status: DC
  Administered 2015-01-24 – 2015-01-25 (×3): 2 [IU] via SUBCUTANEOUS

## 2015-01-24 MED ORDER — LEVOTHYROXINE SODIUM 25 MCG PO TABS
125.0000 ug | ORAL_TABLET | Freq: Every day | ORAL | Status: DC
  Administered 2015-01-25 – 2015-01-26 (×2): 125 ug via ORAL
  Filled 2015-01-24 (×4): qty 1

## 2015-01-24 MED ORDER — TRACE MINERALS CR-CU-MN-SE-ZN 10-1000-500-60 MCG/ML IV SOLN
INTRAVENOUS | Status: DC
  Administered 2015-01-24: 18:00:00 via INTRAVENOUS
  Filled 2015-01-24: qty 1680

## 2015-01-24 NOTE — Progress Notes (Signed)
PROGRESS NOTE    Shannon [name] AYT:[id number] DOB: January 31, 1924 DOA: 01/10/2015 PCP: Shannon Boys, DO  HPI/Brief narrative 81 y.o. female with history of remote gastrojejunostomy & suspected vagotomy, status post GJ anastomosis resection and GJ revision after perforation of anastomotic ulcer 09/2013, G00 01/2014 with EGD 2: #1 with no anatomic outlet obstruction and normal looking pylorus/duodenum, #2: To breakup bezoar, CT 03/12/14: Recurrent bezoar, thickened pyloric channel, EGD 03/19/14: Tortuous/narrowed anatomy at prepylorus/pylorus but no stricture or ulcer, UGI/SBFT 03/25/14: Normal, CT scan 05/21/14: Gastric distention, stomach filled with fluid/food and thickened pyloric wall, barium swallow 05/22/14: Small esophageal hernia, barium trickled into duodenum, eventually assessed to have functional gastric outlet obstruction (follows with [facility]) presented with 4 day history of progressively worsening abdominal pain. Last BM-small one on 10/8, no flatus since 4 days. In ED, CT abdomen suggestive of PSBO versus ileus. Multiple (3) attempts to place NG tube on floor unsuccessful. IR consulted for fluoroscopic guided NG tube placement. CCS consulted. She had teeth pulled on Wed this week, pain control was with Tylenol 3 but she stopped this after Wed. patient was treated conservatively with bowel rest, NG tube and IV fluids despite which her SBO has not resolved. Patient has repeatedly declined surgery. Palliative care was consulted but family have asked them not to see patient and they signed off 10/14. Eventually agreed to surgery and Had ex lap, LOA 10/19. Had postop hypotension which has resolved. Spiked fever and CXR showed possible pneumonia. Started on HCAP rx.  Assessment/Plan:  SBO  S/p ex lap LOA. Advancing to fulls. Getting TPN via PICC. Transfer out of SDU once epidural out.  Essential hypertension  Uncontrolled. Resume PO clonidine and toprol.  HCAP on vancomycin and fortaz. Much  cough, wet vocal quality. Will consult ST for swallow eval  Type II DM controlled  Hypothyroid On synthroid  Stage III chronic kidney disease - Creatinine is at baseline  History of CVA  Anemia - stable  Elevated troponin Echo without WMA. EF normal. Cardiology signed off. On ASA, beta blocker  Severe malnutrition in context of chronic illness On TPN  DVT prophylaxis: Subcutaneous heparin  Code Status: DO NOT RESUSCITATE. Family Communication: discussed with daughter [name]  10/19 Disposition Plan: eventual SNF most likely   Consultants:  Gen. Surgery   Palliative-signed off 10/14  Cardiology-signed off  Procedures:  NG tube  2D Echo: Echo Left ventricle: The cavity size was normal. Systolic function was normal. The estimated ejection fraction was in the range of 55% to 60%. Wall motion was normal; there were no regional wall motion abnormalities. - Aortic valve: There was mild to moderate regurgitation. Regurgitation pressure half-time: 365 ms. - Left atrium: The atrium was severely dilated. - Right ventricle: The cavity size was normal. Wall thickness was normal. Systolic function was normal. - Tricuspid valve: There was mild regurgitation. - Pulmonary arteries: PA peak pressure: 42 mm Hg (S). - Inferior vena cava: The vessel was normal in size. The respirophasic diameter changes were in the normal range (>= 50%), consistent with normal central venous pressure.   Antibiotics:  None    Subjective: Coughing/spitting into cup. Per RN, tolerating clears without signs of aspiration  Objective: Filed Vitals:   01/24/15 0646 01/24/15 0740 01/24/15 0746 01/24/15 0823  BP: 192/63 171/56    Pulse: 87 74    Temp:   97.5 F (36.4 C)   TempSrc:   Oral   Resp:  26    Height:  Weight:      SpO2:  100%  100%    Intake/Output Summary (Last 24 hours) at 01/24/15 1118 Last data filed at 01/24/15 0800  Gross per 24 hour    Intake 3280.66 ml  Output   2450 ml  Net 830.66 ml   Filed Weights   01/12/15 1123 01/24/15 0306  Weight: 57.97 kg (127 lb 12.8 oz) 61.9 kg (136 lb 7.4 oz)     Exam:  General exam: in chair. Spitting/coughing into cup. Wet vocal quality Respiratory system: Clear to auscultation without WRR Cardiovascular system: S1 and S2 heard, RRR. No JVD or, murmurs or pedal edema Gastrointestinal system: quiet. Soft. Dressing in place. Appropriately tender Extremities: scds   Data Reviewed: Basic Metabolic Panel:  Recent Labs Lab 01/19/15 1418 01/19/15 1458 01/20/15 0600 01/21/15 0415 01/22/15 0445 01/22/15 0831 01/23/15 0522  NA  --   --  132* 133* 124* 133* 131*  K  --   --  3.9 4.0 5.4* 4.1 3.9  CL  --   --  102 105 98* 106 102  CO2  --   --  21* 21* 20* 21* 20*  GLUCOSE  --   --  165* 157* 782* 158* 155*  BUN  --   --  8 19 22* 23* 19  CREATININE  --   --  0.92 1.17* 1.12* 1.05* 0.83  CALCIUM  --   --  8.6* 8.8* 8.4* 8.7* 9.0  MG 1.2* 1.2* 2.0 1.6* 2.3 1.9  --   PHOS 1.8*  --  5.8* 3.5 5.8* 3.1  --    Liver Function Tests:  Recent Labs Lab 01/20/15 0600 01/21/15 0415 01/22/15 0445 01/22/15 0831  AST 29 125* 32 27  ALT 25 78* 38 38  ALKPHOS 53 108 83 93  BILITOT 0.4 0.7 0.4 0.3  PROT 5.6* 4.9* 4.5* 5.0*  ALBUMIN 2.7* 2.6* 2.1* 2.3*   No results for input(s): LIPASE, AMYLASE in the last 168 hours. No results for input(s): AMMONIA in the last 168 hours. CBC:  Recent Labs Lab 01/18/15 0323 01/20/15 0600 01/21/15 1232 01/22/15 0445 01/23/15 0522  WBC 6.7 6.7 13.0* 10.9* 9.5  NEUTROABS  --  4.4  --   --  7.0  HGB 10.7* 10.5* 8.8* 8.5* 9.0*  HCT 31.4* 30.6* 25.3* 25.4* 24.8*  MCV 93.7 92.7 93.4 96.2 91.5  PLT 258 256 182 170 199   Cardiac Enzymes: No results for input(s): CKTOTAL, CKMB, CKMBINDEX, TROPONINI in the last 168 hours. BNP (last 3 results) No results for input(s): PROBNP in the last 8760 hours. CBG:  Recent Labs Lab 01/23/15 1521  01/23/15 1910 01/24/15 0006 01/24/15 0303 01/24/15 0738  GLUCAP 167* 147* 138* 155* 185*    Recent Results (from the past 240 hour(s))  Culture, Urine     Status: None   Collection Time: 01/22/15 11:03 AM  Result Value Ref Range Status   Specimen Description URINE, CATHETERIZED  Final   Special Requests NONE  Final   Culture NO GROWTH 1 DAY  Final   Report Status 01/23/2015 FINAL  Final  Culture, blood (routine x 2)     Status: None (Preliminary result)   Collection Time: 01/22/15 12:14 PM  Result Value Ref Range Status   Specimen Description BLOOD RIGHT ANTECUBITAL  Final   Special Requests BOTTLES DRAWN AEROBIC AND ANAEROBIC 10CC  Final   Culture NO GROWTH 2 DAYS  Final   Report Status PENDING  Incomplete  Culture, blood (routine x  2)     Status: None (Preliminary result)   Collection Time: 01/22/15 12:28 PM  Result Value Ref Range Status   Specimen Description BLOOD BLOOD RIGHT HAND  Final   Special Requests   Final    BOTTLES DRAWN AEROBIC AND ANAEROBIC 10CC BLUE, 5CC RED   Culture NO GROWTH 2 DAYS  Final   Report Status PENDING  Incomplete  MRSA PCR Screening     Status: Abnormal   Collection Time: 01/22/15  4:12 PM  Result Value Ref Range Status   MRSA by PCR POSITIVE (A) NEGATIVE Final    Comment:        The GeneXpert MRSA Assay (FDA approved for NASAL specimens only), is one component of a comprehensive MRSA colonization surveillance program. It is not intended to diagnose MRSA infection nor to guide or monitor treatment for MRSA infections. RESULT CALLED TO, READ BACK BY AND VERIFIED WITH: S.DOTY,RN AT 1832 BY L.PITT 01/22/15          Studies: Dg Chest Port 1 View  01/22/2015  CLINICAL DATA:  Cough and fever.  Small bowel obstruction. EXAM: PORTABLE CHEST 1 VIEW COMPARISON:  03/27/2014 FINDINGS: A left subclavian central venous catheter is seen with tip overlying the mid to distal SVC. No evidence of pneumothorax. Nasogastric tube seen entering the  stomach. New bibasilar pulmonary opacity may be due to atelectasis or infiltrates. Mild left pleural thickening versus small pleural effusion remains stable. Cardiomegaly is also stable. IMPRESSION: Support apparatus in appropriate position. No evidence of pneumothorax. New bibasilar infiltrates versus atelectasis. Stable mild left pleural thickening versus effusion. Stable cardiomegaly. Electronically Signed   By: Myles Rosenthal M.D.   On: 01/22/2015 13:06       Scheduled Meds: . cefTAZidime (FORTAZ)  IV  1 g Intravenous Q12H  . Chlorhexidine Gluconate Cloth  6 each Topical Q0600  . cycloSPORINE  1 drop Both Eyes BID  . enalaprilat  1.25 mg Intravenous 4 times per day  . fluticasone  2 spray Each Nare Daily  . Influenza vac split quadrivalent PF  0.5 mL Intramuscular Tomorrow-1000  . insulin aspart  0-9 Units Subcutaneous 3 times per day  . latanoprost  1 drop Both Eyes QHS  . levothyroxine  112.5 mcg Intravenous QAC breakfast  . mometasone-formoterol  2 puff Inhalation BID  . mupirocin cream   Topical BID  . pantoprazole (PROTONIX) IV  40 mg Intravenous Q12H  . polyvinyl alcohol  1 drop Both Eyes QID  . vancomycin  750 mg Intravenous Q24H   Continuous Infusions: . dextrose 5 % and 0.9 % NaCl with KCl 20 mEq/L 30 mL/hr at 01/24/15 0548  . Marland KitchenTPN (CLINIMIX-E) Adult 70 mL/hr at 01/23/15 1800   And  . fat emulsion 240 mL (01/23/15 1800)  . Marland KitchenTPN (CLINIMIX-E) Adult     And  . fat emulsion    . ropivacaine (PF) 2 mg/ml (0.2%) 4 mL/hr (01/24/15 0956)   Time spent: 25 min minutes.    Christiane Ha, MD [facility]  [email or web address] Password [email or web address] 01/24/2015, 11:18 AM    LOS: 14 days

## 2015-01-24 NOTE — Progress Notes (Signed)
4 Days Post-Op  Subjective: PT tol CLD well ? Passing flatus  Objective: Vital signs in last 24 hours: Temp:  [97.5 F (36.4 C)-99.9 F (37.7 C)] 97.5 F (36.4 C) (10/23 0746) Pulse Rate:  [69-87] 74 (10/23 0740) Resp:  [16-26] 26 (10/23 0740) BP: (155-196)/(47-67) 171/56 mmHg (10/23 0740) SpO2:  [99 %-100 %] 100 % (10/23 0823) Weight:  [61.9 kg (136 lb 7.4 oz)] 61.9 kg (136 lb 7.4 oz) (10/23 0306) Last BM Date: 01/19/15  Intake/Output from previous day: 10/22 0701 - 10/23 0700 In: 3170.7 [P.O.:60; I.V.:720; IV Piggyback:250; TPN:1918.7] Out: 2025 [Urine:2025] Intake/Output this shift: Total I/O In: -  Out: 425 [Urine:425]  General appearance: alert and cooperative GI: soft, non-tender; bowel sounds normal; no masses,  no organomegaly  Lab Results:   Recent Labs  01/22/15 0445 01/23/15 0522  WBC 10.9* 9.5  HGB 8.5* 9.0*  HCT 25.4* 24.8*  PLT 170 199   BMET  Recent Labs  01/22/15 0831 01/23/15 0522  NA 133* 131*  K 4.1 3.9  CL 106 102  CO2 21* 20*  GLUCOSE 158* 155*  BUN 23* 19  CREATININE 1.05* 0.83  CALCIUM 8.7* 9.0   PT/INR No results for input(s): LABPROT, INR in the last 72 hours. ABG No results for input(s): PHART, HCO3 in the last 72 hours.  Invalid input(s): PCO2, PO2  Studies/Results: Dg Chest Port 1 View  01/22/2015  CLINICAL DATA:  Cough and fever.  Small bowel obstruction. EXAM: PORTABLE CHEST 1 VIEW COMPARISON:  03/27/2014 FINDINGS: A left subclavian central venous catheter is seen with tip overlying the mid to distal SVC. No evidence of pneumothorax. Nasogastric tube seen entering the stomach. New bibasilar pulmonary opacity may be due to atelectasis or infiltrates. Mild left pleural thickening versus small pleural effusion remains stable. Cardiomegaly is also stable. IMPRESSION: Support apparatus in appropriate position. No evidence of pneumothorax. New bibasilar infiltrates versus atelectasis. Stable mild left pleural thickening versus  effusion. Stable cardiomegaly. Electronically Signed   By: Earle Gell M.D.   On: 01/22/2015 13:06    Anti-infectives: Anti-infectives    Start     Dose/Rate Route Frequency Ordered Stop   01/22/15 2000  vancomycin (VANCOCIN) IVPB 750 mg/150 ml premix     750 mg 150 mL/hr over 60 Minutes Intravenous Every 24 hours 01/22/15 1707     01/22/15 1800  cefTAZidime (FORTAZ) 1 g in dextrose 5 % 50 mL IVPB     1 g 100 mL/hr over 30 Minutes Intravenous Every 12 hours 01/22/15 1707     01/20/15 0600  ceFAZolin (ANCEF) IVPB 2 g/50 mL premix     2 g 100 mL/hr over 30 Minutes Intravenous On call to O.R. 01/19/15 1251 01/20/15 1151   01/10/15 2200  amoxicillin (AMOXIL) 250 MG/5ML suspension 250 mg  Status:  Discontinued     250 mg Oral Every 12 hours 01/10/15 1703 01/10/15 1704   01/10/15 2200  amoxicillin (AMOXIL) 250 MG/5ML suspension 250 mg  Status:  Discontinued     250 mg Per Tube Every 12 hours 01/10/15 1704 01/10/15 1725   01/10/15 1730  amoxicillin (AMOXIL) 250 MG/5ML suspension 500 mg  Status:  Discontinued     500 mg Per Tube 3 times per day 01/10/15 1725 01/13/15 1333      Assessment/Plan: s/p Procedure(s): EXPLORATORY LAPAROTOMY (N/A) LYSIS OF ADHESIONS < 1 HOUR (N/A) Adv to FLD mobilize   LOS: 14 days    Rosario Jacks., Anne Hahn 01/24/2015

## 2015-01-24 NOTE — Progress Notes (Signed)
Pt continues to remain hypertensive. Requiring frequent use of prn med.

## 2015-01-24 NOTE — Progress Notes (Signed)
Anesthesiology note:  81 yo female POD #4 Ex-lap with lysis of adhesions for SBO. Epidural weaned overnight and today. Pain well controlled with IV and PO pain meds. Pt tolerating PO and having bowel movements. Epidural removed with tip intact. Site clean, dry, and without tenderness.  Please call with any questions or concerns.  Deatra Canter, MD

## 2015-01-24 NOTE — Evaluation (Signed)
Clinical/Bedside Swallow Evaluation Patient Details  Name: Shannon [name] MRN: [id number] Date of Birth: June 20, 1923  Today's Date: 01/24/2015 Time: SLP Start Time (ACUTE ONLY): 1600 SLP Stop Time (ACUTE ONLY): 1630 SLP Time Calculation (min) (ACUTE ONLY): 30 min  Past Medical History:  Past Medical History  Diagnosis Date  . Pneumonia   . Hyperlipidemia   . Hypertension   . Small bowel obstruction (HCC)     "I don't know how many" (01/11/2015)  . Ventral hernia with bowel obstruction   . Asthma   . Hyperlipidemia   . Stroke (HCC)     "light one"  . COPD (chronic obstructive pulmonary disease) (HCC)   . Hypothyroidism   . Type II diabetes mellitus (HCC)   . Anemia   . History of blood transfusion   . GERD (gastroesophageal reflux disease)   . History of stomach ulcers   . Arthritis     "all over"  . Renal insufficiency   . Depression     "light case"  . SIADH (syndrome of inappropriate ADH production) (HCC)     [name] 01/10/2015   Past Surgical History:  Past Surgical History  Procedure Laterality Date  . Tubal ligation    . Colectomy    . Hernia repair  2015  . Esophagogastroduodenoscopy N/A 01/19/2014    Procedure: ESOPHAGOGASTRODUODENOSCOPY (EGD);  Surgeon: Hilarie Fredrickson, MD;  Location: [facility];  Service: Endoscopy;  Laterality: N/A;  . Esophagogastroduodenoscopy N/A 01/20/2014    Procedure: ESOPHAGOGASTRODUODENOSCOPY (EGD);  Surgeon: Hilarie Fredrickson, MD;  Location: [facility];  Service: Endoscopy;  Laterality: N/A;  . Esophagogastroduodenoscopy N/A 03/19/2014    Procedure: ESOPHAGOGASTRODUODENOSCOPY (EGD);  Surgeon: Rachael Fee, MD;  Location: [facility];  Service: Endoscopy;  Laterality: N/A;  . Cholecystectomy open    . Ventral hernia repair  2015    incarcerated ventral hernia ([facility] 09/2013)/notes 01/10/2015  . Cataract extraction w/ intraocular lens  implant, bilateral    . Gastrojejunostomy      hx/notes 01/10/2015  . Laparotomy N/A 01/20/2015     Procedure: EXPLORATORY LAPAROTOMY;  Surgeon: Abigail Miyamoto, MD;  Location: [facility] OR;  Service: General;  Laterality: N/A;  . Lysis of adhesion N/A 01/20/2015    Procedure: LYSIS OF ADHESIONS < 1 HOUR;  Surgeon: Abigail Miyamoto, MD;  Location: [facility] OR;  Service: General;  Laterality: N/A;   HPI:  81 yo female adm to [facility] with female with history of remote gastrojejunostomy & suspected vagotomy, status post GJ anastomosis resection and GJ revision after perforation of anastomotic ulcer 09/2013, G00 01/2014 with EGD 2: #1 with no anatomic outlet obstruction and normal looking pylorus/duodenum, #2: To breakup bezoar, CT 03/12/14: Recurrent bezoar, thickened pyloric channel, EGD 03/19/14: Tortuous/narrowed anatomy at prepylorus/pylorus but no stricture or ulcer, UGI/SBFT 03/25/14: Normal, CT scan 05/21/14: Gastric distention, stomach filled with fluid/food and thickened pyloric wall, barium swallow 05/22/14: Small esophageal hernia, barium trickled into duodenum, eventually assessed to have functional gastric outlet obstruction (follows with [facility]) presented with 4 day history of progressively worsening abdominal pain. Last BM-small one on 10/8, no flatus since 4 days. In ED, CT abdomen suggestive of PSBO versus ileus. Multiple (3) attempts to place NG tube on floor unsuccessful. IR consulted for fluoroscopic guided NG tube placement. CCS consulted.  Pt initally declined to have surgery, but agreed and is s/p laproscopic surgery 10/19.  Post surgery complicated by hypotension, fever spike and pt was diagnosed with pna.  Swallow evaluation ordered.  Pt does have h/o a  CVA but denies speech/swallow changes with cva.  She admits to slightly worsening swallow ability currently which she attributes to recent NG tube. Premorbid choking on drinks reported by pt.      Assessment / Plan / Recommendation Clinical Impression  Pt presents with subtle indications of pharyngeal and suspected cervical esophageal dysphagia.   CN exam was unremarkable but pt is generally weak.  Pt does admit to premorbid occasional "choking" on liquids at home.  She did demonstrate weak cough s/p swallow of water via straw and throat clearing consistently post swallow via cup - that may be indicative of laryngeal penetration or aspiration.  Productive cough x1 noted during minimal intake.  In addition, pt did become fatigued and minimally short of breath during small amount of intake.  Also question excessive oro-lingual movement? Pt tolerated nectar tea, Ensure and tsps of thin well without indications of airway compromise.  Question if recent NG placement and deconditioning may be source of mild exacerbation of pt's baseline mild dysphagia.  Recommend modify to nectar fulls  - allowing tsps of thin - with aspiration precautions.  Hopeful pt diet may be advanced at bedside as she medically improves.  Educated pt to plan and modificiations - and she was agreeable.  SLP to follow up for pt/family education and readiness for dietary advancement.  Thanks for this consult.     Aspiration Risk  Moderate    Diet Recommendation Thin;Nectar   Medication Administration: Whole meds with puree Compensations: Slow rate;Small sips/bites    Other  Recommendations Oral Care Recommendations: Oral care BID Other Recommendations: Order thickener from pharmacy   Follow Up Recommendations       Frequency and Duration min 2x/week  2 weeks   Pertinent Vitals/Pain Low grade fever      Swallow Study Prior Functional Status   see hhx    General Date of Onset: 01/24/15 Other Pertinent Information: 81 yo female adm to [facility] with female with history of remote gastrojejunostomy & suspected vagotomy, status post GJ anastomosis resection and GJ revision after perforation of anastomotic ulcer 09/2013, G00 01/2014 with EGD 2: #1 with no anatomic outlet obstruction and normal looking pylorus/duodenum, #2: To breakup bezoar, CT 03/12/14: Recurrent bezoar,  thickened pyloric channel, EGD 03/19/14: Tortuous/narrowed anatomy at prepylorus/pylorus but no stricture or ulcer, UGI/SBFT 03/25/14: Normal, CT scan 05/21/14: Gastric distention, stomach filled with fluid/food and thickened pyloric wall, barium swallow 05/22/14: Small esophageal hernia, barium trickled into duodenum, eventually assessed to have functional gastric outlet obstruction (follows with [facility]) presented with 4 day history of progressively worsening abdominal pain. Last BM-small one on 10/8, no flatus since 4 days. In ED, CT abdomen suggestive of PSBO versus ileus. Multiple (3) attempts to place NG tube on floor unsuccessful. IR consulted for fluoroscopic guided NG tube placement. CCS consulted.  Pt initally declined to have surgery, but agreed and is s/p laproscopic surgery 10/19.  Post surgery complicated by hypotension, fever spike and pt was diagnosed with pna.  Swallow evaluation ordered.  Pt does have h/o a CVA but denies speech/swallow changes with cva.  She admits to slightly worsening swallow ability currently which she attributes to recent NG tube. Premorbid choking on drinks reported by pt.    Type of Study: Bedside swallow evaluation Diet Prior to this Study: Thin liquids Temperature Spikes Noted: Yes Respiratory Status: Supplemental O2 delivered via (comment) History of Recent Intubation: No Behavior/Cognition: Alert;Cooperative;Pleasant mood Oral Cavity - Dentition: Adequate natural dentition/normal for age (no lower dention and she  does not have a denture - per chart recent extraction) Self-Feeding Abilities: Able to feed self Patient Positioning: Upright in bed Baseline Vocal Quality: Low vocal intensity Volitional Cough: Weak Volitional Swallow: Unable to elicit    Oral/Motor/Sensory Function Overall Oral Motor/Sensory Function: Other (comment) (generalized weakness, no focal cn deficits)   Ice Chips Ice chips: Not tested   Thin Liquid Thin Liquid:  Impaired Presentation: Cup;Straw;Spoon;Self Fed Pharyngeal  Phase Impairments: Cough - Immediate;Throat Clearing - Immediate    Nectar Thick Nectar Thick Liquid: Within functional limits Presentation: Self Fed;Cup;Straw;Spoon   Honey Thick Honey Thick Liquid: Not tested   Puree Puree: Within functional limits Presentation: Spoon   Solid   GO    Solid: Not tested (pt on liquid only diet)       Mickie Bail [place], MS [facility] SLP [phone]

## 2015-01-24 NOTE — Progress Notes (Signed)
PARENTERAL NUTRITION CONSULT NOTE  Pharmacy Consult for TPN Indication: SBO  No Known Allergies  Patient Measurements: Height: _0  (167.6 cm) (taken from 11/06/14) Weight: 136 lb 7.4 oz (61.9 kg) IBW/kg (Calculated) : 59.3  Vital Signs: Temp: 97.9 F (36.6 C) (10/23 0306) Temp Source: Oral (10/23 0306) BP: 192/63 mmHg (10/23 0646) Pulse Rate: 87 (10/23 0646) Intake/Output from previous day: 10/22 0701 - 10/23 0700 In: 3170.7 [P.O.:60; I.V.:720; IV Piggyback:250; TPN:1918.7] Out: 2025 [Urine:2025] Intake/Output from this shift:    Labs:  Recent Labs  01/21/15 1232 01/22/15 0445 01/23/15 0522  WBC 13.0* 10.9* 9.5  HGB 8.8* 8.5* 9.0*  HCT 25.3* 25.4* 24.8*  PLT 182 170 199     Recent Labs  01/22/15 0445 01/22/15 0831 01/23/15 0522  NA 124* 133* 131*  K 5.4* 4.1 3.9  CL 98* 106 102  CO2 20* 21* 20*  GLUCOSE 782* 158* 155*  BUN 22* 23* 19  CREATININE 1.12* 1.05* 0.83  CALCIUM 8.4* 8.7* 9.0  MG 2.3 1.9  --   PHOS 5.8* 3.1  --   PROT 4.5* 5.0*  --   ALBUMIN 2.1* 2.3*  --   AST 32 27  --   ALT 38 38  --   ALKPHOS 83 93  --   BILITOT 0.4 0.3  --    Estimated Creatinine Clearance: 43 mL/min (by C-G formula based on Cr of 0.83).    Recent Labs  01/23/15 1910 01/24/15 0006 01/24/15 0303  GLUCAP 147* 138* 155*    Insulin Requirements in the past 24 hours:  6units on SSI q4h  Assessment: 33 yof with hx of gastric outlet obstruction, SBO, sequela of perforated pyloric/duodenal ulcer in 2015. Presenting with 4 day history of abdominal pain, progressively worsening,LBM on 10/8 pta, no flatus x 4 days pta on admit. CT suggests SBO vs. Ileus. Pt repeatedly declining surgery and palliative consulted but family declined. NPO since 01/10/15. Pharmacy consulted to start TPN for pSBO. Scheduled for surgery on 10/19. Severe malnutrition in setting of chronic illness noted in RD assessment. Pt at high risk of refeeding syndrome.  Surgeries/Procedures: 10/19: ex-lap  w/ LOA  GI: Prealbumin 12.4. high-grade SBO not resolving. Palliative declined, opted for surgery. S/p surgery on 10/19 after failing conservative management.  Started clear liquid diet yesterday.  Endo: T2DM. Hypothyroid - synthroid. CBGs 138-167 on SSI q4h. No meds, no insulin pta  Lytes: K 3.9, Phos 3.1, Mg 1.9, CoCa 10.4.  No labs on 10/23  Renal: CKD3. SCr 1.05, CrCl~30, UOP 1.34m/kg/h. D5NSw/20K_1 ,   Pulm: Dulera - 100% on RA  Cards: HTN. Amlodipine, asa81, vastotec, hydralazine, lopressor  Hepatobil: tbili/alk phos wnl but increased, TG 91, alb 2.3, monitor LFT increase AST 27 ALT38 Tbili 0.3  Neuro: Hx CVA. Sertraline. Hydromorphone + morphine post-surgery  ID: Tmax 99.9, wbc 9.5 - antibiotics started 10/21 due to fever and possible pneumonia.   10/21 vancomycin>> 10/21 ceftazidime>>  10/21 Blood x 2>> 10/21 MRSA pcr Positive 10/21 Urine   Best Practices: SCD on  TPN Access: PICC 10/18>> TPN start date: 01/19/15>>  Current Nutrition:  Clinimix E 5/15 _2  ml/h + 20% IVFE _3  ml/h - provides 1672kcal + 84g protein daily  Nutritional Goals: per RD assessment 10/18 1500-1700 kCal, 75-90 grams of protein per day  Plan:  - Continue Clinimix E 5/15 @ 70 ml/h + 20% IVFE _4  ml/h (has been advanced slowly due to refeeding risk) - TPN will provide 1672kcal + 84g protein daily (meeting 100% of  kcal/protein needs) - SSI q8h and monitor CBGs - F/u AM labs - F/u plans to wean TPN as appropriate - Continue vancomycin and ceftazidime at current doses.    Heide Guile, PharmD, BCPS-AQ ID Clinical Pharmacist Pager [phone]   01/24/2015 7:16 AM

## 2015-01-24 NOTE — Progress Notes (Signed)
Pt hypertensive, scheduled vasotec given, will continue to monitor.

## 2015-01-25 DIAGNOSIS — I10 Essential (primary) hypertension: Secondary | ICD-10-CM

## 2015-01-25 DIAGNOSIS — M1990 Unspecified osteoarthritis, unspecified site: Secondary | ICD-10-CM

## 2015-01-25 DIAGNOSIS — E1140 Type 2 diabetes mellitus with diabetic neuropathy, unspecified: Secondary | ICD-10-CM

## 2015-01-25 DIAGNOSIS — J441 Chronic obstructive pulmonary disease with (acute) exacerbation: Secondary | ICD-10-CM

## 2015-01-25 DIAGNOSIS — E222 Syndrome of inappropriate secretion of antidiuretic hormone: Secondary | ICD-10-CM

## 2015-01-25 DIAGNOSIS — R5381 Other malaise: Secondary | ICD-10-CM

## 2015-01-25 DIAGNOSIS — G8918 Other acute postprocedural pain: Secondary | ICD-10-CM

## 2015-01-25 DIAGNOSIS — N183 Chronic kidney disease, stage 3 (moderate): Secondary | ICD-10-CM

## 2015-01-25 DIAGNOSIS — E039 Hypothyroidism, unspecified: Secondary | ICD-10-CM

## 2015-01-25 LAB — TYPE AND SCREEN
ABO/RH(D): AB POS
Antibody Screen: POSITIVE
DAT, IgG: NEGATIVE
PT AG Type: NEGATIVE
Unit division: 0
Unit division: 0

## 2015-01-25 LAB — GLUCOSE, CAPILLARY
Glucose-Capillary: 156 mg/dL — ABNORMAL HIGH (ref 65–99)
Glucose-Capillary: 124 mg/dL — ABNORMAL HIGH (ref 65–99)

## 2015-01-25 MED ORDER — HEPARIN SODIUM (PORCINE) 5000 UNIT/ML IJ SOLN
5000.0000 [IU] | Freq: Three times a day (TID) | INTRAMUSCULAR | Status: DC
  Administered 2015-01-25 – 2015-01-27 (×8): 5000 [IU] via SUBCUTANEOUS
  Filled 2015-01-25 (×6): qty 1

## 2015-01-25 MED ORDER — PANTOPRAZOLE SODIUM 40 MG PO TBEC
40.0000 mg | DELAYED_RELEASE_TABLET | Freq: Two times a day (BID) | ORAL | Status: DC
  Administered 2015-01-25 – 2015-01-27 (×5): 40 mg via ORAL
  Filled 2015-01-25 (×6): qty 1

## 2015-01-25 MED ORDER — HEPARIN SODIUM (PORCINE) 5000 UNIT/ML IJ SOLN
INTRAMUSCULAR | Status: AC
  Administered 2015-01-25: 5000 [IU]
  Filled 2015-01-25: qty 1

## 2015-01-25 MED ORDER — ACETAMINOPHEN 325 MG PO TABS: 650.0000 mg | ORAL_TABLET | Freq: Four times a day (QID) | ORAL | Status: DC | PRN

## 2015-01-25 NOTE — Consult Note (Signed)
Physical Medicine and Rehabilitation Consult Reason for Consult: Debility Referring Physician:  Dr. Algis Liming.    HPI: Shannon [name] is a 81 y.o. female with history of HTN, COPD, OA, CKD stage III, prior gastrojejunostomy, jeunojejunostomy due to perforated ulcer, repair of incarcerated ventral hernia [facility] 09/2013), large gastric bezoar replacing the proximal third of the stomach but no endoscopic evidence for surgical altered anatomy, stomach ulcer, or high-grade stenosis as well as multiple prior admission partial obstructions treated conservatively as family has refused surgery. She was readmitted on 01/12/15 with abdominal pain and CT abdomen showing partial SBO v/s ileus. She was treated conservatively with NGT and bowel rest without improvement. She was agreeable to surgery on 10/19 and underwent exploratory lap with lysis of adhesions by Dr. Ninfa Linden. Post op with hypotension requiring fluid bolus. She has been weaned off epidural and fevers due to PNA treated with fortaz. Swallow evaluation done and patient started nectar liquids and advanced to dysphagia 1, thin today.  PT ongoing and CIR recommended due to deconditioned state. Pt lives with daughter and son-in-law, who are able to provide 24/7 support on discharge.     Review of Systems  HENT: Negative for hearing loss.   Eyes: Negative for blurred vision and double vision.  Respiratory: Positive for shortness of breath. Negative for cough.   Cardiovascular: Negative for chest pain, palpitations and leg swelling.  Gastrointestinal: Positive for abdominal pain. Negative for heartburn and nausea.  Musculoskeletal: Positive for joint pain. Negative for myalgias and back pain.  Skin: Negative for itching.  Neurological: Positive for tingling, sensory change and weakness. Negative for dizziness, seizures and headaches.  Psychiatric/Behavioral: The patient is not nervous/anxious.   All other systems reviewed and are negative.      Past Medical History  Diagnosis Date  . Pneumonia   . Hyperlipidemia   . Hypertension   . Small bowel obstruction ([place])     "I don't know how many" (01/11/2015)  . Ventral hernia with bowel obstruction   . Asthma   . Hyperlipidemia   . Stroke ([place])     "light one"  . COPD (chronic obstructive pulmonary disease) ([place])   . Hypothyroidism   . Type II diabetes mellitus ([place])   . Anemia   . History of blood transfusion   . GERD (gastroesophageal reflux disease)   . History of stomach ulcers   . Arthritis     "all over"  . Renal insufficiency   . Depression     "light case"  . SIADH (syndrome of inappropriate ADH production) ([place])     [name] 01/10/2015    Past Surgical History  Procedure Laterality Date  . Tubal ligation    . Colectomy    . Hernia repair  2015  . Esophagogastroduodenoscopy N/A 01/19/2014    Procedure: ESOPHAGOGASTRODUODENOSCOPY (EGD);  Surgeon: Irene Shipper, MD;  Location: [facility];  Service: Endoscopy;  Laterality: N/A;  . Esophagogastroduodenoscopy N/A 01/20/2014    Procedure: ESOPHAGOGASTRODUODENOSCOPY (EGD);  Surgeon: Irene Shipper, MD;  Location: [facility];  Service: Endoscopy;  Laterality: N/A;  . Esophagogastroduodenoscopy N/A 03/19/2014    Procedure: ESOPHAGOGASTRODUODENOSCOPY (EGD);  Surgeon: Milus Banister, MD;  Location: [facility];  Service: Endoscopy;  Laterality: N/A;  . Cholecystectomy open    . Ventral hernia repair  2015    incarcerated ventral hernia ([facility] 09/2013)/notes 01/10/2015  . Cataract extraction w/ intraocular lens  implant, bilateral    . Gastrojejunostomy      hx/notes 01/10/2015  .  Laparotomy N/A 01/20/2015    Procedure: EXPLORATORY LAPAROTOMY;  Surgeon: Coralie Keens, MD;  Location: [facility];  Service: General;  Laterality: N/A;  . Lysis of adhesion N/A 01/20/2015    Procedure: LYSIS OF ADHESIONS < 1 HOUR;  Surgeon: Coralie Keens, MD;  Location: [facility];  Service: General;  Laterality: N/A;    Family History   Problem Relation Age of Onset  . Stroke Mother   . Heart attack Neg Hx   . Hypertension Mother   . Diabetes Brother     Social History:  Lives with daughter.  Widowed. Independent with rollator.  She reports that she has never smoked. She has quit using smokeless tobacco. Her smokeless tobacco use included Snuff. She reports that she does not drink alcohol or use illicit drugs.    Allergies: No Known Allergies    Medications Prior to Admission  Medication Sig Dispense Refill  . acetaminophen (TYLENOL) 325 MG tablet Take 650 mg by mouth every 6 (six) hours as needed for mild pain or fever.    [name] ADVAIR DISKUS 250-50 MCG/DOSE AEPB INHALE 1 PUFF INTO THE LUNGS 2 (TWO) TIMES DAILY AS NEEDED FOR SHORTNESS OF BREATH  3  . albuterol (PROVENTIL HFA;VENTOLIN HFA) 108 (90 BASE) MCG/ACT inhaler Inhale 2 puffs into the lungs every 6 (six) hours as needed for wheezing or shortness of breath. 1 Inhaler 2  . amLODipine (NORVASC) 10 MG tablet Take 1 tablet (10 mg total) by mouth daily. For HTN 30 tablet 3  . aspirin EC 81 MG tablet Take 81 mg by mouth every morning.    [name] atorvastatin (LIPITOR) 10 MG tablet TAKE 1 TABLET BY MOUTH EVERY DAY 30 tablet 5  . b complex vitamins tablet Take 1 tablet by mouth 2 (two) times daily.    . calcium-vitamin D (OSCAL WITH D) 500-200 MG-UNIT per tablet Take 1 tablet by mouth daily with breakfast.     . chlorhexidine (PERIDEX) 0.12 % solution 15 mLs by Mouth Rinse route 2 (two) times daily. 120 mL 0  . cloNIDine (CATAPRES) 0.1 MG tablet Take 1 tablet (0.1 mg total) by mouth 2 (two) times daily. 60 tablet 11  . feeding supplement, GLUCERNA SHAKE, (GLUCERNA SHAKE) LIQD Take 237 mLs by mouth 3 (three) times daily between meals. 90 Can 1  . fluticasone (FLONASE) 50 MCG/ACT nasal spray Place 2 sprays into both nostrils daily. 16 g 3  . gabapentin (NEURONTIN) 300 MG capsule Take 1 capsule (300 mg total) by mouth 3 (three) times daily. 90 capsule 3  . hydrALAZINE (APRESOLINE)  100 MG tablet TAKE 1 TABLET BY MOUTH 3 TIMES A DAY 90 tablet 5  . isosorbide mononitrate (IMDUR) 30 MG 24 hr tablet Take one tablet by mouth once daily. If Chest Pains occur increase to two tablets daily. 90 tablet 3  . L-Methylfolate-Algae-B12-B6 (METANX) 3-90.314-2-35 MG CAPS TAKE 1 CAPSULE BY MOUTH ONCE DAILY 30 capsule 3  . lactulose (CHRONULAC) 10 GM/15ML solution Take 15 mLs (10 g total) by mouth daily as needed for mild constipation. 240 mL 0  . latanoprost (XALATAN) 0.005 % ophthalmic solution Place 1 drop into both eyes at bedtime. 2.5 mL 12  . levothyroxine (SYNTHROID, LEVOTHROID) 100 MCG tablet Take 1 tablet (100 mcg total) by mouth daily. Take with 158mcg dose (total 280mcg) 30 tablet 3  . levothyroxine (SYNTHROID, LEVOTHROID) 125 MCG tablet Take 1 tablet (125 mcg total) by mouth daily before breakfast. Take with 1107mcg dose (total 261mcg) 30 tablet 3  .  metoprolol succinate (TOPROL-XL) 25 MG 24 hr tablet Take 1 tablet (25 mg total) by mouth daily. 30 tablet 3  . Multiple Vitamins-Minerals (MULTIVITAMIN WITH MINERALS) tablet Take 1 tablet by mouth every morning.     . pantoprazole (PROTONIX) 40 MG tablet TAKE 1 TABLET BY MOUTH TWICE A DAY 60 tablet 11  . Polyethyl Glycol-Propyl Glycol 0.4-0.3 % SOLN Place 1 drop into both eyes 4 (four) times daily.     . polyethylene glycol (MIRALAX / GLYCOLAX) packet Take 17 g by mouth 2 (two) times daily.    . RESTASIS 0.05 % ophthalmic emulsion USE 1 DROP INTO BOTH EYES TWICE DAILY 60 mL 1  . sennosides-docusate sodium (SENOKOT-S) 8.6-50 MG tablet Take 1 tablet by mouth 2 (two) times daily.     . sertraline (ZOLOFT) 100 MG tablet Take one tablet by mouth once daily for depression 30 tablet 3  . sodium chloride 1 G tablet TAKE 1 TABLET BY MOUTH THREE TIMES DAILY 90 tablet 2  . traMADol (ULTRAM) 50 MG tablet Take by mouth 2 (two) times daily as needed for moderate pain.      Home: Home Living Family/patient expects to be discharged to:: Private  residence Living Arrangements: Children Available Help at Discharge: Family, Available 24 hours/day Type of Home: House Home Access: [place]: One level Bathroom Shower/Tub: Chiropodist: Standard Bathroom Accessibility: Yes [place]: Environmental consultant - 2 wheels, Walker - 4 wheels, Cane - single point, Shower seat, Hand held shower head, Financial controller: Reacher, Long-handled shoe horn  Functional History: Prior Function Level of Independence: Independent Comments: Per pt's daughter used rolling walker occassionally Functional Status:  Mobility: Bed Mobility Overal bed mobility: Needs Assistance Bed Mobility: Supine to Sit, Sit to Supine Supine to sit: Mod assist, HOB elevated Sit to supine: Mod assist General bed mobility comments: pt up in chair Transfers Overall transfer level: Needs assistance Equipment used: Rolling walker (2 wheeled) Transfers: Sit to/from Stand Sit to Stand: Min guard General transfer comment: increased time, v/c's to push up from chair, Ambulation/Gait Ambulation/Gait assistance: Min guard Ambulation Distance (Feet): 100 Feet Assistive device: Rolling walker (2 wheeled) Gait Pattern/deviations: Step-through pattern, Decreased stride length General Gait Details: pt with increasd trunk flexion due to gas pain, pt able to achieve back extension however unable to maintain. no episodes of LOB Gait velocity: slow    ADL: ADL Overall ADL's : Needs assistance/impaired Eating/Feeding: Independent, Sitting Grooming: Set up, Sitting Upper Body Bathing: Minimal assitance, Sitting Lower Body Bathing: Minimal assistance, Sitting/lateral leans, Sit to/from stand Upper Body Dressing : Minimal assistance, Sitting Lower Body Dressing: Minimal assistance, Sitting/lateral leans, Sit to/from stand Toilet Transfer: Min guard, Ambulation, RW, BSC General ADL Comments: Pt demonstrated UE AROM for precursor to ADL  participation.  Cognition: Cognition Overall Cognitive Status: Within Functional Limits for tasks assessed Orientation Level: Oriented X4 Cognition Arousal/Alertness: Awake/alert Behavior During Therapy: WFL for tasks assessed/performed Overall Cognitive Status: Within Functional Limits for tasks assessed   Blood pressure 170/50, pulse 70, temperature 98.2 F (36.8 C), temperature source Oral, resp. rate 26, height 5\' 6"  (1.676 m), weight 61.9 kg (136 lb 7.4 oz), SpO2 97 %. Physical Exam  Nursing note and vitals reviewed. Constitutional: She is oriented to person, place, and time. She appears well-developed.  Frail  HENT:  Head: Normocephalic and atraumatic.  Eyes: Conjunctivae and EOM are normal.  Neck: Normal range of motion. Neck supple.  Cardiovascular: Normal rate and regular rhythm.  Exam reveals  no friction rub.   Respiratory: Breath sounds normal. She has no wheezes.  SOB with vocalization  GI: Soft. She exhibits no distension. Bowel sounds are decreased. There is tenderness.  Musculoskeletal: She exhibits no edema or tenderness.  Strength b/l UE 4-/5 grossly B/l LE 4/5 grossly  Neurological: She is alert and oriented to person, place, and time.  She is able to follow commands without difficulty.   Skin: Skin is warm and dry. No rash noted.  Psychiatric: She has a normal mood and affect. Her behavior is normal.    Results for orders placed or performed during the hospital encounter of 01/10/15 (from the past 24 hour(s))  Glucose, capillary     Status: Abnormal   Collection Time: 01/24/15  7:20 PM  Result Value Ref Range   Glucose-Capillary 146 (H) 65 - 99 mg/dL  Glucose, capillary     Status: Abnormal   Collection Time: 01/24/15 11:23 PM  Result Value Ref Range   Glucose-Capillary 134 (H) 65 - 99 mg/dL  Glucose, capillary     Status: Abnormal   Collection Time: 01/25/15  3:15 AM  Result Value Ref Range   Glucose-Capillary 156 (H) 65 - 99 mg/dL   No results  found.  Assessment/Plan: Diagnosis: Debility Labs and images independently reviewed.  Records reviewed and summated above.  1. Does the need for close, 24 hr/day medical supervision in concert with the patient's rehab needs make it unreasonable for this patient to be served in a less intensive setting? Yes 2. Co-Morbidities requiring supervision/potential complications: HTN (monitor and provide prns in accordance with increased physical exertion and pain), COPD (monitor O2 sats with increased activity), OA (ensure pain does not limit aerobic exercise), CKD stage III (Cont to monitor, avoid nephrotoxic meds), prior gastric procedures, tachypnea, post-operative pain (transition to oral pain meds, but while balancing respiratory side effects), hypothyroidism (ensure appropriate mood and energy for therapies), SAIDH (monitor Na+), (transfuse if necessary to ensure appropriate perfusion for increased activity tolerance), DM with neuropathy (monitor in accordance with exercise and adjust meds as necessary) 3. Due to safety, disease management, medication administration, pain management and patient education, does the patient require 24 hr/day rehab nursing? Potentially 4. Does the patient require coordinated care of a physician, rehab nurse, PT (1.5-2 hrs/day, 5 days/week) and OT (1.5-2 hrs/day, 5 days/week) to address physical and functional deficits in the context of the above medical diagnosis(es)? Potentially Addressing deficits in the following areas: endurance, locomotion, strength, transferring, bathing, dressing, grooming, toileting and psychosocial support 5. Can the patient actively participate in an intensive therapy program of at least 3 hrs of therapy per day at least 5 days per week? Potentially 6. The potential for patient to make measurable gains while on inpatient rehab is good 7. Anticipated functional outcomes upon discharge from inpatient rehab are modified independent, supervision and min  assist  with PT, modified independent and supervision with OT, n/a with SLP. 8. Estimated rehab length of stay to reach the above functional goals is: 7-10 days. 9. Does the patient have adequate social supports and living environment to accommodate these discharge functional goals? Yes 10. Anticipated D/C setting: Home 11. Anticipated post D/C treatments: HH therapy and Home excercise program 12. Overall Rehab/Functional Prognosis: excellent and good  RECOMMENDATIONS: This patient's condition is appropriate for continued rehabilitative care in the following setting: CIR, HH Therapy and Home Excercise Program Patient has agreed to participate in recommended program. Yes Note that insurance prior authorization may be required for reimbursement for  recommended care.  Comment: Rehab Admissions Coordinator to follow up.  Delice Lesch, MD 01/25/2015

## 2015-01-25 NOTE — Progress Notes (Signed)
Pt ambulated with PT in unit earlier today.  Report given to [facility] for transfer to room 10

## 2015-01-25 NOTE — Progress Notes (Signed)
Physical Therapy Treatment Patient Details Name: Shannon [name] MRN: [id number] DOB: May 30, 1923 Today's Date: 01/25/2015    History of Present Illness 81 y.o. female admitted with SBO, now s/p Exploratory Laparotomy.  Pt with h/o gastric outlet obstruction, SBO, sequela of a perforated pyloric / duodenal ulcer and SIADH (syndrome of inappropriate ADH production)     PT Comments    Pt with improved mobility this date despite 7/10 abdominal gas pains. Pt motivated with good home set up and support. Pt progressing towards all goals. Pt to benefit from ST- rehab to improve strength and endurance for safe transition home with daughter.   Follow Up Recommendations  CIR     Equipment Recommendations  None recommended by PT    Recommendations for Other Services       Precautions / Restrictions Precautions Precautions: Fall Restrictions Weight Bearing Restrictions: No    Mobility  Bed Mobility               General bed mobility comments: pt up in chair  Transfers Overall transfer level: Needs assistance Equipment used: Rolling walker (2 wheeled) Transfers: Sit to/from Stand Sit to Stand: Min guard         General transfer comment: increased time, v/c's to push up from chair,  Ambulation/Gait Ambulation/Gait assistance: Min guard Ambulation Distance (Feet): 100 Feet Assistive device: Rolling walker (2 wheeled) Gait Pattern/deviations: Step-through pattern;Decreased stride length Gait velocity: slow   General Gait Details: pt with increasd trunk flexion due to gas pain, pt able to achieve back extension however unable to maintain. no episodes of LOB   Stairs            Wheelchair Mobility    Modified Rankin (Stroke Patients Only)       Balance                                    Cognition Arousal/Alertness: Awake/alert Behavior During Therapy: WFL for tasks assessed/performed Overall Cognitive Status: Within Functional Limits for tasks  assessed                      Exercises General Exercises - Lower Extremity Ankle Circles/Pumps: AROM;Both;10 reps;Seated Long Arc Quad: AROM;Both;10 reps;Seated Hip Flexion/Marching: AROM;Both;10 reps;Standing    General Comments        Pertinent Vitals/Pain Pain Assessment: 0-10 Pain Score: 7  Pain Location: abdomen, gas pains Pain Intervention(s): Monitored during session    Home Living                      Prior Function            PT Goals (current goals can now be found in the care plan section) Acute Rehab PT Goals Patient Stated Goal: none stated Progress towards PT goals: Progressing toward goals    Frequency  Min 3X/week    PT Plan Current plan remains appropriate    Co-evaluation             End of Session Equipment Utilized During Treatment: Gait belt Activity Tolerance: Patient tolerated treatment well Patient left: in chair;with call bell/phone within reach     Time: 6644-0347 PT Time Calculation (min) (ACUTE ONLY): 27 min  Charges:  $Gait Training: 8-22 mins $Therapeutic Exercise: 8-22 mins                    G Codes:  Marcene Brawn 01/25/2015, 11:35 AM   Lewis Shock, PT, DPT Pager #: [phone] Office #: [phone]

## 2015-01-25 NOTE — Anesthesia Postprocedure Evaluation (Signed)
Anesthesia Post Note  Patient: Shannon [name]  Procedure(s) Performed: Procedure(s) (LRB): EXPLORATORY LAPAROTOMY (N/A) LYSIS OF ADHESIONS < 1 HOUR (N/A)  Anesthesia type: General  Patient location: PACU  Post pain: Pain level controlled  Post assessment: Post-op Vital signs reviewed  Last Vitals: BP 144/62 mmHg  Pulse 68  Temp(Src) 36.8 C (Oral)  Resp 18  Ht 5\' 6"  (1.676 m)  Wt 136 lb 7.4 oz (61.9 kg)  BMI 22.04 kg/m2  SpO2 99%  Post vital signs: Reviewed  Level of consciousness: sedated  PACU course: Some hypotension, likely due to hypovolemia in setting of dosing her epidural, responsive to volume.   Complications: No apparent anesthesia complications

## 2015-01-25 NOTE — Progress Notes (Signed)
Patient arrived to room from 3S, alert at this time. Safety precautions and orders reviewed with patient. Patient appears in no distress. Will continue to monitor.  Ave Filter, RN

## 2015-01-25 NOTE — Progress Notes (Signed)
Speech Language Pathology Treatment: Dysphagia  Patient Details Name: Jamacia Goetzman MRN: [id number] DOB: [birth year] Today's Date: 01/25/2015 Time: [id number] SLP Time Calculation (min) (ACUTE ONLY): 15 min  Assessment / Plan / Recommendation Clinical Impression  During po upgrade, pt tolerated thin liquids with no overt s/s of aspiration. Pt clears throat intermittently at baseline, reported it has been worse since NG tube removal. SLP educated pt that NG tube may have caused some irritation, it will likely heal with time. Pt independently follows recommendation for small sips/bites, needs assist sitting up. SLP recommends dysphagia 1 (puree), thin liquids, medication whole with puree. Pt reported she eats puree food at baseline due to missing dentition. Will f/u for check of diet tolerance.    HPI Other Pertinent Information: 81 yo female adm to [facility] with female with history of remote gastrojejunostomy & suspected vagotomy, status post GJ anastomosis resection and GJ revision after perforation of anastomotic ulcer 09/2013, G00 01/2014 with EGD 2: #1 with no anatomic outlet obstruction and normal looking pylorus/duodenum, #2: To breakup bezoar, CT 03/12/14: Recurrent bezoar, thickened pyloric channel, EGD 03/19/14: Tortuous/narrowed anatomy at prepylorus/pylorus but no stricture or ulcer, UGI/SBFT 03/25/14: Normal, CT scan 05/21/14: Gastric distention, stomach filled with fluid/food and thickened pyloric wall, barium swallow 05/22/14: Small esophageal hernia, barium trickled into duodenum, eventually assessed to have functional gastric outlet obstruction (follows with [facility]) presented with 4 day history of progressively worsening abdominal pain. Last BM-small one on 10/8, no flatus since 4 days. In ED, CT abdomen suggestive of PSBO versus ileus. Multiple (3) attempts to place NG tube on floor unsuccessful. IR consulted for fluoroscopic guided NG tube placement. CCS consulted.  Pt initally declined to have  surgery, but agreed and is s/p laproscopic surgery 10/19.  Post surgery complicated by hypotension, fever spike and pt was diagnosed with pna.  Swallow evaluation ordered.  Pt does have h/o a CVA but denies speech/swallow changes with cva.  She admits to slightly worsening swallow ability currently which she attributes to recent NG tube. Premorbid choking on drinks reported by pt.      Pertinent Vitals Pain Assessment: Faces Faces Pain Scale: No hurt  SLP Plan  Continue with current plan of care    Recommendations Diet recommendations: Dysphagia 1 (puree);Thin liquid Liquids provided via: Cup Medication Administration: Whole meds with puree Supervision: Patient able to self feed;Intermittent supervision to cue for compensatory strategies Compensations: Slow rate;Small sips/bites Postural Changes and/or Swallow Maneuvers: Seated upright 90 degrees;Upright 30-60 min after meal              Oral Care Recommendations: Oral care BID Plan: Continue with current plan of care    [place], Student-SLP  Lanier Ensign 01/25/2015, 11:05 AM

## 2015-01-25 NOTE — Care Management Important Message (Signed)
Important Message  Patient Details  Name: Shannon [name] MRN: [id number] Date of Birth: 1923-11-06   Medicare Important Message Given:  Yes-second notification given    [name] May 01/25/2015, 10:41 AM

## 2015-01-25 NOTE — Progress Notes (Signed)
Pharmacy: TPN TPN dc'd by CCS this mornning. Diet advanced to soft.  Plan: continue TPN until this bag completed, then stop around 1800 -dc TPN labs - change IV PPI to PO - DC SSI and CBGs  Thanks Eudelia Bunch, Pharm.D. [id number] 01/25/2015 8:39 AM

## 2015-01-25 NOTE — Progress Notes (Addendum)
PROGRESS NOTE    Shannon [name] [id number] DOB: 12-09-1923 DOA: 01/10/2015 PCP: Kirt Boys, DO  HPI/Brief narrative 81 y.o. female with history of remote gastrojejunostomy & suspected vagotomy, status post GJ anastomosis resection and GJ revision after perforation of anastomotic ulcer 09/2013, G00 01/2014 with EGD 2: #1 with no anatomic outlet obstruction and normal looking pylorus/duodenum, #2: To breakup bezoar, CT 03/12/14: Recurrent bezoar, thickened pyloric channel, EGD 03/19/14: Tortuous/narrowed anatomy at prepylorus/pylorus but no stricture or ulcer, UGI/SBFT 03/25/14: Normal, CT scan 05/21/14: Gastric distention, stomach filled with fluid/food and thickened pyloric wall, barium swallow 05/22/14: Small esophageal hernia, barium trickled into duodenum, eventually assessed to have functional gastric outlet obstruction (follows with [facility]) presented with 4 day history of progressively worsening abdominal pain. Last BM-small one on 10/8, no flatus since 4 days. In ED, CT abdomen suggestive of PSBO versus ileus. Multiple (3) attempts to place NG tube on floor unsuccessful. IR consulted for fluoroscopic guided NG tube placement. CCS consulted. She had teeth pulled on Wed this week, pain control was with Tylenol 3 but she stopped this after Wed. Patient was treated conservatively with bowel rest, NG tube and IV fluids despite which her SBO did not resolve. Patient had repeatedly declined surgery. Palliative care was consulted but family have asked them not to see patient and they signed off 10/14. Eventually agreed to surgery and Had ex lap, LOA 10/19. Had postop hypotension which has resolved. Spiked fever and CXR showed possible pneumonia. Started on HCAP rx.  Assessment/Plan:  SBO  S/p ex lap LOA. CCS advancing diet to soft. Getting TPN via PICC-wean off his diet improves. Transfer out of SDU once epidural out.  Essential hypertension  Uncontrolled. Resume PO clonidine and toprol.  Better controlled.  HCAP  - on vancomycin and fortaz. Much cough, wet vocal quality. As per speech therapy, dysphagia 1 diet and thin liquids. - Urine culture 1 negative. Blood cultures 2: Negative to date. DC vancomycin. Consider de-escalating Elita Quick to oral antibiotics on 10/25.  Type II DM controlled  Hypothyroid On synthroid  Stage III chronic kidney disease - Creatinine is at baseline  History of CVA  Anemia - stable  Elevated troponin Echo without WMA. EF normal. Cardiology signed off. On ASA, beta blocker  Severe malnutrition in context of chronic illness On TPN  Hyponatremia - Stable   DVT prophylaxis: Subcutaneous heparin  Code Status: DO NOT RESUSCITATE. Family Communication: None at bedside Disposition Plan: PT recommending CIR-we'll consult 10/24. Transfer to floor 10/24.   Consultants:  Gen. Surgery   Palliative-signed off 10/14  Cardiology-signed off  Rehabilitation M.D.  Procedures:  NG tube  2D Echo: Echo Left ventricle: The cavity size was normal. Systolic function was normal. The estimated ejection fraction was in the range of 55% to 60%. Wall motion was normal; there were no regional wall motion abnormalities. - Aortic valve: There was mild to moderate regurgitation. Regurgitation pressure half-time: 365 ms. - Left atrium: The atrium was severely dilated. - Right ventricle: The cavity size was normal. Wall thickness was normal. Systolic function was normal. - Tricuspid valve: There was mild regurgitation. - Pulmonary arteries: PA peak pressure: 42 mm Hg (S). - Inferior vena cava: The vessel was normal in size. The respirophasic diameter changes were in the normal range (>= 50%), consistent with normal central venous pressure.   Antibiotics:  IV Fortaz 10/21 >  IV vancomycin 10/21 >  Subjective: Tolerating liquid diet. Had BM 10/23. Flatus +. Denies complaints. Per nursing, no acute  events.  Objective: Filed Vitals:   01/25/15 0321 01/25/15 0700 01/25/15 0840 01/25/15 1137  BP: 167/61 144/62  170/50  Pulse:  68  70  Temp: 98.2 F (36.8 C) 98.3 F (36.8 C)  98.2 F (36.8 C)  TempSrc: Oral Oral  Oral  Resp:  18  26  Height:      Weight:      SpO2:  99% 99% 97%    Intake/Output Summary (Last 24 hours) at 01/25/15 1250 Last data filed at 01/25/15 1138  Gross per 24 hour  Intake 2347.7 ml  Output   2975 ml  Net -627.3 ml   Filed Weights   01/12/15 1123 01/24/15 0306  Weight: 57.97 kg (127 lb 12.8 oz) 61.9 kg (136 lb 7.4 oz)     Exam:  General exam: Pleasant elderly frail female sitting up comfortably in chair in no obvious distress. Respiratory system: Clear to auscultation. No increased work of breathing. Cardiovascular system: S1 and S2 heard, RRR. No JVD or, murmurs or pedal edema. Telemetry: Sinus rhythm with occasional PACs. Gastrointestinal system: Abdomen is nondistended, soft and nontender. Surgical dressing clean and dry. Normal bowel sounds heard. CNS: Alert and oriented. No focal deficits. Extremities: scds. Moving all extremities well.   Data Reviewed: Basic Metabolic Panel:  Recent Labs Lab 01/19/15 1418 01/19/15 1458 01/20/15 0600 01/21/15 0415 01/22/15 0445 01/22/15 0831 01/23/15 0522  NA  --   --  132* 133* 124* 133* 131*  K  --   --  3.9 4.0 5.4* 4.1 3.9  CL  --   --  102 105 98* 106 102  CO2  --   --  21* 21* 20* 21* 20*  GLUCOSE  --   --  165* 157* 782* 158* 155*  BUN  --   --  8 19 22* 23* 19  CREATININE  --   --  0.92 1.17* 1.12* 1.05* 0.83  CALCIUM  --   --  8.6* 8.8* 8.4* 8.7* 9.0  MG 1.2* 1.2* 2.0 1.6* 2.3 1.9  --   PHOS 1.8*  --  5.8* 3.5 5.8* 3.1  --    Liver Function Tests:  Recent Labs Lab 01/20/15 0600 01/21/15 0415 01/22/15 0445 01/22/15 0831  AST 29 125* 32 27  ALT 25 78* 38 38  ALKPHOS 53 108 83 93  BILITOT 0.4 0.7 0.4 0.3  PROT 5.6* 4.9* 4.5* 5.0*  ALBUMIN 2.7* 2.6* 2.1* 2.3*   No results  for input(s): LIPASE, AMYLASE in the last 168 hours. No results for input(s): AMMONIA in the last 168 hours. CBC:  Recent Labs Lab 01/20/15 0600 01/21/15 1232 01/22/15 0445 01/23/15 0522  WBC 6.7 13.0* 10.9* 9.5  NEUTROABS 4.4  --   --  7.0  HGB 10.5* 8.8* 8.5* 9.0*  HCT 30.6* 25.3* 25.4* 24.8*  MCV 92.7 93.4 96.2 91.5  PLT 256 182 170 199   Cardiac Enzymes: No results for input(s): CKTOTAL, CKMB, CKMBINDEX, TROPONINI in the last 168 hours. BNP (last 3 results) No results for input(s): PROBNP in the last 8760 hours. CBG:  Recent Labs Lab 01/24/15 0738 01/24/15 1215 01/24/15 1920 01/24/15 2323 01/25/15 0315  GLUCAP 185* 160* 146* 134* 156*    Recent Results (from the past 240 hour(s))  Culture, Urine     Status: None   Collection Time: 01/22/15 11:03 AM  Result Value Ref Range Status   Specimen Description URINE, CATHETERIZED  Final   Special Requests NONE  Final   Culture NO  GROWTH 1 DAY  Final   Report Status 01/23/2015 FINAL  Final  Culture, blood (routine x 2)     Status: None (Preliminary result)   Collection Time: 01/22/15 12:14 PM  Result Value Ref Range Status   Specimen Description BLOOD RIGHT ANTECUBITAL  Final   Special Requests BOTTLES DRAWN AEROBIC AND ANAEROBIC 10CC  Final   Culture NO GROWTH 2 DAYS  Final   Report Status PENDING  Incomplete  Culture, blood (routine x 2)     Status: None (Preliminary result)   Collection Time: 01/22/15 12:28 PM  Result Value Ref Range Status   Specimen Description BLOOD BLOOD RIGHT HAND  Final   Special Requests   Final    BOTTLES DRAWN AEROBIC AND ANAEROBIC 10CC BLUE, 5CC RED   Culture NO GROWTH 2 DAYS  Final   Report Status PENDING  Incomplete  MRSA PCR Screening     Status: Abnormal   Collection Time: 01/22/15  4:12 PM  Result Value Ref Range Status   MRSA by PCR POSITIVE (A) NEGATIVE Final    Comment:        The GeneXpert MRSA Assay (FDA approved for NASAL specimens only), is one component of  a comprehensive MRSA colonization surveillance program. It is not intended to diagnose MRSA infection nor to guide or monitor treatment for MRSA infections. RESULT CALLED TO, READ BACK BY AND VERIFIED WITH: S.DOTY,RN AT 1832 BY L.PITT 01/22/15          Studies: No results found.     Scheduled Meds: . cefTAZidime (FORTAZ)  IV  1 g Intravenous Q12H  . Chlorhexidine Gluconate Cloth  6 each Topical Q0600  . cloNIDine  0.1 mg Oral BID  . cycloSPORINE  1 drop Both Eyes BID  . fluticasone  2 spray Each Nare Daily  . heparin subcutaneous  5,000 Units Subcutaneous 3 times per day  . Influenza vac split quadrivalent PF  0.5 mL Intramuscular Tomorrow-1000  . latanoprost  1 drop Both Eyes QHS  . levothyroxine  125 mcg Oral QAC breakfast  . metoprolol succinate  25 mg Oral Daily  . mometasone-formoterol  2 puff Inhalation BID  . mupirocin cream   Topical BID  . pantoprazole  40 mg Oral BID  . polyvinyl alcohol  1 drop Both Eyes QID  . vancomycin  750 mg Intravenous Q24H   Continuous Infusions:   Time spent: 25 min minutes.    Marcellus Scott, MD, FACP, FHM. [facility] Pager [phone]  If 7PM-7AM, please contact night-coverage [email or web address] Password [email or web address] 01/25/2015, 12:57 PM    LOS: 15 days

## 2015-01-25 NOTE — Progress Notes (Signed)
OT Cancellation Note  Patient Details Name: Shannon [name] MRN: [id number] DOB: 06-07-1923   Cancelled Treatment:    Reason Eval/Treat Not Completed: Pt initially on bedpan, then transferring to another unit.  Will reattempt.    Darlina Rumpf Hall, OTR/L [id number]  01/25/2015, 5:12 PM

## 2015-01-25 NOTE — Progress Notes (Signed)
Patient ID: Shannon [name], female   DOB: [birth year], 81 y.o.   MRN: [id number]     [facility]      [address]   [place], [place] [address]    Phone: [phone] FAX: [phone]     Subjective: BM yesterday.  Appetite okay.  Sitting up in a chair, looks great.afebrile.   C/o gas pains.    Objective:  Vital signs:  Filed Vitals:   01/24/15 2105 01/24/15 2320 01/25/15 0321 01/25/15 0700  BP:  162/44 167/61   Pulse: 85     Temp:  98.5 F (36.9 C) 98.2 F (36.8 C) 98.3 F (36.8 C)  TempSrc:  Oral Oral Oral  Resp: 26     Height:      Weight:      SpO2:        Last BM Date: 01/19/15  Intake/Output   Yesterday:  10/23 0701 - 10/24 0700 In: 2297.7 [P.O.:120; I.V.:30; IV Piggyback:250; TPN:1839.8] Out: 3675 [Urine:3675] This shift:    I/O last 3 completed shifts: In: 4478.4 [P.O.:180; I.V.:480; Other:279.9; IV Piggyback:500] Out: 5375 [Urine:5375]    Physical Exam: General: Pt awake/alert/oriented x4 in no acute distress Chest: cta.  No chest wall pain w good excursion CV:  Pulses intact.  Regular rhythm MS: Normal AROM mjr joints.  No obvious deformity Abdomen: Soft.  Nondistended.  Midline incision-no erythema, edges are approximated,staples in place. Mildly tender at incisions only.  No evidence of peritonitis.  No incarcerated hernias. Ext:  SCDs BLE.  No mjr edema.  No cyanosis Skin: No petechiae / purpura   Problem List:   Principal Problem:   SBO (small bowel obstruction) (HCC) Active Problems:   AP (abdominal pain)   SIADH (syndrome of inappropriate ADH production) (HCC)   Protein-calorie malnutrition, severe   Elevated troponin   HTN (hypertension), uncontrolled   Palliative care encounter   DNR (do not resuscitate) discussion   Bradycardia   Hypotension   Fever, unspecified   HCAP (healthcare-associated pneumonia)    Results:   Labs: Results for orders placed or performed during the hospital  encounter of 01/10/15 (from the past 48 hour(s))  Glucose, capillary     Status: Abnormal   Collection Time: 01/23/15 11:45 AM  Result Value Ref Range   Glucose-Capillary 145 (H) 65 - 99 mg/dL   Comment 1 Notify RN    Comment 2 Document in Chart   Glucose, capillary     Status: Abnormal   Collection Time: 01/23/15  3:21 PM  Result Value Ref Range   Glucose-Capillary 167 (H) 65 - 99 mg/dL  Glucose, capillary     Status: Abnormal   Collection Time: 01/23/15  7:10 PM  Result Value Ref Range   Glucose-Capillary 147 (H) 65 - 99 mg/dL  Glucose, capillary     Status: Abnormal   Collection Time: 01/24/15 12:06 AM  Result Value Ref Range   Glucose-Capillary 138 (H) 65 - 99 mg/dL   Comment 1 Notify RN   Glucose, capillary     Status: Abnormal   Collection Time: 01/24/15  3:03 AM  Result Value Ref Range   Glucose-Capillary 155 (H) 65 - 99 mg/dL   Comment 1 Notify RN   Glucose, capillary     Status: Abnormal   Collection Time: 01/24/15  7:38 AM  Result Value Ref Range   Glucose-Capillary 185 (H) 65 - 99 mg/dL   Comment 1 Notify RN    Comment 2 Document in Chart  Glucose, capillary     Status: Abnormal   Collection Time: 01/24/15 12:15 PM  Result Value Ref Range   Glucose-Capillary 160 (H) 65 - 99 mg/dL  Glucose, capillary     Status: Abnormal   Collection Time: 01/24/15  7:20 PM  Result Value Ref Range   Glucose-Capillary 146 (H) 65 - 99 mg/dL  Glucose, capillary     Status: Abnormal   Collection Time: 01/24/15 11:23 PM  Result Value Ref Range   Glucose-Capillary 134 (H) 65 - 99 mg/dL    Imaging / Studies: No results found.  Medications / Allergies:  Scheduled Meds: . cefTAZidime (FORTAZ)  IV  1 g Intravenous Q12H  . Chlorhexidine Gluconate Cloth  6 each Topical Q0600  . cloNIDine  0.1 mg Oral BID  . cycloSPORINE  1 drop Both Eyes BID  . fluticasone  2 spray Each Nare Daily  . Influenza vac split quadrivalent PF  0.5 mL Intramuscular Tomorrow-1000  . insulin aspart  0-9  Units Subcutaneous 3 times per day  . latanoprost  1 drop Both Eyes QHS  . levothyroxine  100 mcg Oral Daily  . levothyroxine  125 mcg Oral QAC breakfast  . metoprolol succinate  25 mg Oral Daily  . mometasone-formoterol  2 puff Inhalation BID  . mupirocin cream   Topical BID  . pantoprazole (PROTONIX) IV  40 mg Intravenous Q12H  . polyvinyl alcohol  1 drop Both Eyes QID  . vancomycin  750 mg Intravenous Q24H   Continuous Infusions: . Marland KitchenTPN (CLINIMIX-E) Adult 70 mL/hr at 01/24/15 1748   And  . fat emulsion 240 mL (01/24/15 1749)   PRN Meds:.acetaminophen, albuterol, alum & mag hydroxide-simeth, dextrose, diphenhydrAMINE, hydrALAZINE, iohexol, menthol-cetylpyridinium, metoprolol, morphine injection, nitroGLYCERIN, phenol, promethazine, RESOURCE THICKENUP CLEAR, sodium chloride, sodium chloride  Antibiotics: Anti-infectives    Start     Dose/Rate Route Frequency Ordered Stop   01/22/15 2000  vancomycin (VANCOCIN) IVPB 750 mg/150 ml premix     750 mg 150 mL/hr over 60 Minutes Intravenous Every 24 hours 01/22/15 1707     01/22/15 1800  cefTAZidime (FORTAZ) 1 g in dextrose 5 % 50 mL IVPB     1 g 100 mL/hr over 30 Minutes Intravenous Every 12 hours 01/22/15 1707     01/20/15 0600  ceFAZolin (ANCEF) IVPB 2 g/50 mL premix     2 g 100 mL/hr over 30 Minutes Intravenous On call to O.R. 01/19/15 1251 01/20/15 1151   01/10/15 2200  amoxicillin (AMOXIL) 250 MG/5ML suspension 250 mg  Status:  Discontinued     250 mg Oral Every 12 hours 01/10/15 1703 01/10/15 1704   01/10/15 2200  amoxicillin (AMOXIL) 250 MG/5ML suspension 250 mg  Status:  Discontinued     250 mg Per Tube Every 12 hours 01/10/15 1704 01/10/15 1725   01/10/15 1730  amoxicillin (AMOXIL) 250 MG/5ML suspension 500 mg  Status:  Discontinued     500 mg Per Tube 3 times per day 01/10/15 1725 01/13/15 1333        Assessment/Plan SBO POD#6 exploratory laparotomy, LOA---Dr. Magnus Ivan -advance to soft diet -leave midline incision  OTA -mobilize and pulmonary toilet(pulling IS) FEN-advance to solids  PCM-wean off TPN  VTE prophylaxis-SCD, I resumed her heparin ID-fortaz/vanc for suspected PNA Dispo-may transfer to floor from a surgical standpoint    Ashok Norris, [facility] Pager [phone](7A-4:30P) For consults and floor pages call [phone](7A-4:30P)  01/25/2015 8:11 AM

## 2015-01-26 LAB — CBC
HCT: 24.5 % — ABNORMAL LOW (ref 36.0–46.0)
Hemoglobin: 8.6 g/dL — ABNORMAL LOW (ref 12.0–15.0)
MCH: 31.7 pg (ref 26.0–34.0)
MCHC: 35.1 g/dL (ref 30.0–36.0)
MCV: 90.4 fL (ref 78.0–100.0)
Platelets: 265 10*3/uL (ref 150–400)
RBC: 2.71 MIL/uL — ABNORMAL LOW (ref 3.87–5.11)
RDW: 14.3 % (ref 11.5–15.5)
WBC: 5.5 10*3/uL (ref 4.0–10.5)

## 2015-01-26 LAB — BASIC METABOLIC PANEL
Anion gap: 9 (ref 5–15)
BUN: 26 mg/dL — ABNORMAL HIGH (ref 6–20)
CO2: 24 mmol/L (ref 22–32)
Calcium: 9.3 mg/dL (ref 8.9–10.3)
Chloride: 97 mmol/L — ABNORMAL LOW (ref 101–111)
Creatinine, Ser: 0.83 mg/dL (ref 0.44–1.00)
GFR calc Af Amer: >60 mL/min (ref >60)
GFR calc non Af Amer: >60 mL/min (ref >60)
Glucose, Bld: 94 mg/dL (ref 65–99)
Potassium: 4.6 mmol/L (ref 3.5–5.1)
Sodium: 130 mmol/L — ABNORMAL LOW (ref 135–145)

## 2015-01-26 MED ORDER — ASPIRIN EC 81 MG PO TBEC
81.0000 mg | DELAYED_RELEASE_TABLET | Freq: Every morning | ORAL | Status: DC
  Administered 2015-01-27: 81 mg via ORAL
  Filled 2015-01-26: qty 1

## 2015-01-26 MED ORDER — BOOST / RESOURCE BREEZE PO LIQD
1.0000 | Freq: Three times a day (TID) | ORAL | Status: DC
  Administered 2015-01-26 (×3): 1 via ORAL
  Administered 2015-01-27: 11:00:00 via ORAL
  Administered 2015-01-27: 1 via ORAL
  Filled 2015-01-26 (×8): qty 1

## 2015-01-26 MED ORDER — SIMETHICONE 80 MG PO CHEW
80.0000 mg | CHEWABLE_TABLET | Freq: Four times a day (QID) | ORAL | Status: DC
  Administered 2015-01-26 – 2015-01-27 (×6): 80 mg via ORAL
  Filled 2015-01-26 (×6): qty 1

## 2015-01-26 MED ORDER — SERTRALINE HCL 100 MG PO TABS
100.0000 mg | ORAL_TABLET | Freq: Every day | ORAL | Status: DC
  Administered 2015-01-26 – 2015-01-27 (×2): 100 mg via ORAL
  Filled 2015-01-26 (×2): qty 1

## 2015-01-26 MED ORDER — GABAPENTIN 300 MG PO CAPS
300.0000 mg | ORAL_CAPSULE | Freq: Three times a day (TID) | ORAL | Status: DC
  Administered 2015-01-26 – 2015-01-27 (×4): 300 mg via ORAL
  Filled 2015-01-26 (×4): qty 1

## 2015-01-26 MED ORDER — AMLODIPINE BESYLATE 10 MG PO TABS
10.0000 mg | ORAL_TABLET | Freq: Every day | ORAL | Status: DC
  Administered 2015-01-26 – 2015-01-27 (×2): 10 mg via ORAL
  Filled 2015-01-26 (×2): qty 1

## 2015-01-26 MED ORDER — ADULT MULTIVITAMIN LIQUID CH
5.0000 mL | Freq: Every day | ORAL | Status: DC
  Administered 2015-01-26 – 2015-01-27 (×2): 5 mL via ORAL
  Filled 2015-01-26 (×2): qty 5

## 2015-01-26 MED ORDER — ISOSORBIDE MONONITRATE ER 30 MG PO TB24
30.0000 mg | ORAL_TABLET | Freq: Every day | ORAL | Status: DC
  Administered 2015-01-26 – 2015-01-27 (×2): 30 mg via ORAL
  Filled 2015-01-26 (×2): qty 1

## 2015-01-26 MED ORDER — ATORVASTATIN CALCIUM 10 MG PO TABS
10.0000 mg | ORAL_TABLET | Freq: Every day | ORAL | Status: DC
  Administered 2015-01-26: 10 mg via ORAL
  Filled 2015-01-26: qty 1

## 2015-01-26 MED ORDER — PRO-STAT SUGAR FREE PO LIQD
30.0000 mL | Freq: Three times a day (TID) | ORAL | Status: DC
  Administered 2015-01-26 – 2015-01-27 (×5): 30 mL via ORAL
  Filled 2015-01-26 (×5): qty 30

## 2015-01-26 MED ORDER — LEVOTHYROXINE SODIUM 25 MCG PO TABS
225.0000 ug | ORAL_TABLET | Freq: Every day | ORAL | Status: DC
  Administered 2015-01-27: 225 ug via ORAL
  Filled 2015-01-26: qty 2
  Filled 2015-01-26: qty 1

## 2015-01-26 MED ORDER — OXYCODONE HCL 5 MG PO TABS: 5.0000 mg | ORAL_TABLET | ORAL | Status: DC | PRN

## 2015-01-26 MED ORDER — LEVOFLOXACIN 500 MG PO TABS
500.0000 mg | ORAL_TABLET | ORAL | Status: DC
  Administered 2015-01-26: 500 mg via ORAL
  Filled 2015-01-26: qty 1

## 2015-01-26 MED ORDER — OXYCODONE HCL 5 MG PO TABS
5.0000 mg | ORAL_TABLET | Freq: Four times a day (QID) | ORAL | Status: DC | PRN
Start: 2015-01-26 — End: 2015-01-27

## 2015-01-26 NOTE — Care Management Note (Signed)
Case Management Note  Patient Details  Name: Shannon [name] MRN: [id number] Date of Birth: 1923-12-09  Subjective/Objective:                    Action/Plan: Per notes patient is progressing well and will not qualify for CIR. Plan will be for rehab at SNF. CM will continue to follow for discharge needs.   Expected Discharge Date:                  Expected Discharge Plan:  [place] (Daughter expressing SNF/REHAB )  In-House Referral:  Clinical Social Work  Discharge planning Services  CM Consult  Post Acute Care Choice:    Choice offered to:  Patient, Sibling Manufacturing engineer)  DME Arranged:    DME Agency:     HH Arranged:    [place] Agency:     Status of Service:  In process, will continue to follow  Medicare Important Message Given:  Yes-second notification given Date Medicare IM Given:    Medicare IM give by:    Date Additional Medicare IM Given:    Additional Medicare Important Message give by:     If discussed at [place] of Stay Meetings, dates discussed:    Additional Comments:  Pollie Friar, RN 01/26/2015, 10:32 AM

## 2015-01-26 NOTE — Progress Notes (Addendum)
ANTIBIOTIC CONSULT NOTE - FOLLOW UP  Pharmacy Consult for ceftazidime Indication: HCAP  No Known Allergies  Patient Measurements: Height: 5\' 6"  (167.6 cm) (taken from 11/06/14) Weight: 136 lb 7.4 oz (61.9 kg) IBW/kg (Calculated) : 59.3 Adjusted Body Weight:   Vital Signs: Temp: 98.7 F (37.1 C) (10/25 0930) Temp Source: Oral (10/25 0930) BP: 175/66 mmHg (10/25 0930) Pulse Rate: 62 (10/25 0930) Intake/Output from previous day: 10/24 0701 - 10/25 0700 In: 864 [I.V.:10; IV Piggyback:50; TPN:804] Out: 1725 [Urine:1725] Intake/Output from this shift:    Labs:  Recent Labs  01/26/15 0625  WBC 5.5  HGB 8.6*  PLT 265  CREATININE 0.83   Estimated Creatinine Clearance: 43 mL/min (by C-G formula based on Cr of 0.83). No results for input(s): VANCOTROUGH, VANCOPEAK, VANCORANDOM, GENTTROUGH, GENTPEAK, GENTRANDOM, TOBRATROUGH, TOBRAPEAK, TOBRARND, AMIKACINPEAK, AMIKACINTROU, AMIKACIN in the last 72 hours.   Microbiology: Recent Results (from the past 720 hour(s))  Culture, Urine     Status: None   Collection Time: 01/22/15 11:03 AM  Result Value Ref Range Status   Specimen Description URINE, CATHETERIZED  Final   Special Requests NONE  Final   Culture NO GROWTH 1 DAY  Final   Report Status 01/23/2015 FINAL  Final  Culture, blood (routine x 2)     Status: None (Preliminary result)   Collection Time: 01/22/15 12:14 PM  Result Value Ref Range Status   Specimen Description BLOOD RIGHT ANTECUBITAL  Final   Special Requests BOTTLES DRAWN AEROBIC AND ANAEROBIC 10CC  Final   Culture NO GROWTH 3 DAYS  Final   Report Status PENDING  Incomplete  Culture, blood (routine x 2)     Status: None (Preliminary result)   Collection Time: 01/22/15 12:28 PM  Result Value Ref Range Status   Specimen Description BLOOD BLOOD RIGHT HAND  Final   Special Requests   Final    BOTTLES DRAWN AEROBIC AND ANAEROBIC 10CC BLUE, 5CC RED   Culture NO GROWTH 3 DAYS  Final   Report Status PENDING  Incomplete   MRSA PCR Screening     Status: Abnormal   Collection Time: 01/22/15  4:12 PM  Result Value Ref Range Status   MRSA by PCR POSITIVE (A) NEGATIVE Final    Comment:        The GeneXpert MRSA Assay (FDA approved for NASAL specimens only), is one component of a comprehensive MRSA colonization surveillance program. It is not intended to diagnose MRSA infection nor to guide or monitor treatment for MRSA infections. RESULT CALLED TO, READ BACK BY AND VERIFIED WITH: S.DOTY,RN AT 1832 BY L.PITT 01/22/15     Anti-infectives    Start     Dose/Rate Route Frequency Ordered Stop   01/22/15 2000  vancomycin (VANCOCIN) IVPB 750 mg/150 ml premix  Status:  Discontinued     750 mg 150 mL/hr over 60 Minutes Intravenous Every 24 hours 01/22/15 1707 01/25/15 1301   01/22/15 1800  cefTAZidime (FORTAZ) 1 g in dextrose 5 % 50 mL IVPB     1 g 100 mL/hr over 30 Minutes Intravenous Every 12 hours 01/22/15 1707     01/20/15 0600  ceFAZolin (ANCEF) IVPB 2 g/50 mL premix     2 g 100 mL/hr over 30 Minutes Intravenous On call to O.R. 01/19/15 1251 01/20/15 1151   01/10/15 2200  amoxicillin (AMOXIL) 250 MG/5ML suspension 250 mg  Status:  Discontinued     250 mg Oral Every 12 hours 01/10/15 1703 01/10/15 1704   01/10/15 2200  amoxicillin (AMOXIL) 250 MG/5ML suspension 250 mg  Status:  Discontinued     250 mg Per Tube Every 12 hours 01/10/15 1704 01/10/15 1725   01/10/15 1730  amoxicillin (AMOXIL) 250 MG/5ML suspension 500 mg  Status:  Discontinued     500 mg Per Tube 3 times per day 01/10/15 1725 01/13/15 1333      Assessment: 81 y/o female with SBO s/p ex lap LOA. She continues on day 5 ceftazidime for HCAP. She is afebrile, WBC are normal, renal function is stable. Cultures are ngtd.  Goal of Therapy:  Eradication of infection  Plan:  - Continue ceftazidime 1 g IV q12h - Consider narrow to Augmentin 500 mg PO q12h  [facility], [place].D., BCPS Clinical Pharmacist Pager: [phone] 01/26/2015  1:16 PM   ADDENDUM:  MD would like to transition to Levaquin PO for remaining 3 days.  Plan: Stop ceftazidime Start levofloxacin 500mg  PO Q48 today for 2 doses Monitor clinical picture, renal function

## 2015-01-26 NOTE — Progress Notes (Addendum)
, M.D. or  PROGRESS NOTE    Shannon [name] [id number] DOB: 1923-04-06 DOA: 01/10/2015 PCP: Kirt Boys, DO  HPI/Brief narrative 81 y.o. female with history of remote gastrojejunostomy & suspected vagotomy, status post GJ anastomosis resection and GJ revision after perforation of anastomotic ulcer 09/2013, G00 01/2014 with EGD 2: #1 with no anatomic outlet obstruction and normal looking pylorus/duodenum, #2: To breakup bezoar, CT 03/12/14: Recurrent bezoar, thickened pyloric channel, EGD 03/19/14: Tortuous/narrowed anatomy at prepylorus/pylorus but no stricture or ulcer, UGI/SBFT 03/25/14: Normal, CT scan 05/21/14: Gastric distention, stomach filled with fluid/food and thickened pyloric wall, barium swallow 05/22/14: Small esophageal hernia, barium trickled into duodenum, eventually assessed to have functional gastric outlet obstruction (follows with [facility]) presented with 4 day history of progressively worsening abdominal pain. Last BM-small one on 10/8, no flatus since 4 days. In ED, CT abdomen suggestive of PSBO versus ileus. Multiple (3) attempts to place NG tube on floor unsuccessful. IR consulted for fluoroscopic guided NG tube placement. CCS consulted. She had teeth pulled on Wed this week, pain control was with Tylenol 3 but she stopped this after Wed. Patient was treated conservatively with bowel rest, NG tube and IV fluids despite which her SBO did not resolve. Patient had repeatedly declined surgery. Palliative care was consulted but family have asked them not to see patient and they signed off 10/14. Eventually agreed to surgery and Had ex lap, LOA 10/19. Had postop hypotension which has resolved. Spiked fever and CXR showed possible pneumonia. Started on HCAP rx.  Assessment/Plan:  SBO  S/p ex lap LOA. Tolerating dysphagia 1 diet. Having BMs & passing flatus. TPN weaned off.   Essential hypertension  Uncontrolled. Resumed PO clonidine and toprol. Resume home dose of amlodipine &  Imdur  HCAP  - on vancomycin and fortaz. Much cough, wet vocal quality. As per speech therapy, dysphagia 1 diet and thin liquids. - Urine culture 1 negative. Blood cultures 2: Negative to date. DC vancomycin. Consider de-escalating Elita Quick to oral antibiotics on 10/25.  Type II DM controlled  Hypothyroid On synthroid  Stage III chronic kidney disease - Creatinine is at baseline  History of CVA  Anemia - stable  Elevated troponin Echo without WMA. EF normal. Cardiology signed off. On ASA, beta blocker, statins  Severe malnutrition in context of chronic illness On TPN  Hyponatremia - Stable  Severe malnutrition in context of chronic illness -Management per dietitian input.   Depression - resume Zoloft.  DVT prophylaxis: Subcutaneous heparin  Code Status: DO NOT RESUSCITATE. Family Communication: None at bedside Disposition Plan: PT recommending CIR-patient was screened by rehabilitation admissions coordinator who at this time are recommending SNF versus home health with family support. As per discussion with case management, family prefers to take patient home. Possible DC home in the next 1-2 days.   Consultants:  Gen. Surgery   Palliative-signed off 10/14  Cardiology-signed off  Rehabilitation M.D.  Procedures:  NG tube-DC'd  Foley catheter-DC'd 10/25  LUE PICC line-to be discontinued prior to discharge  2D Echo: Echo Left ventricle: The cavity size was normal. Systolic function was normal. The estimated ejection fraction was in the range of 55% to 60%. Wall motion was normal; there were no regional wall motion abnormalities. - Aortic valve: There was mild to moderate regurgitation. Regurgitation pressure half-time: 365 ms. - Left atrium: The atrium was severely dilated. - Right ventricle: The cavity size was normal. Wall thickness was normal. Systolic function was normal. - Tricuspid valve: There was mild regurgitation. -  Pulmonary  arteries: PA peak pressure: 42 mm Hg (S). - Inferior vena cava: The vessel was normal in size. The respirophasic diameter changes were in the normal range (>= 50%), consistent with normal central venous pressure.   Antibiotics:  IV Fortaz 10/21 >  IV vancomycin 10/21 >  Subjective: Tolerated dysphagia 1 diet. Lots of flatus. Had BM 10/24. Denies any other complaints.  Objective: Filed Vitals:   01/26/15 0146 01/26/15 0635 01/26/15 0930 01/26/15 1431  BP: 183/67 187/60 175/66 184/58  Pulse: 59 58 62 64  Temp: 98.2 F (36.8 C) 97 F (36.1 C) 98.7 F (37.1 C) 98.6 F (37 C)  TempSrc: Oral Oral Oral Oral  Resp: 18 18 20 16   Height:      Weight:      SpO2: 99% 100% 95% 96%    Intake/Output Summary (Last 24 hours) at 01/26/15 1642 Last data filed at 01/26/15 1432  Gross per 24 hour  Intake     60 ml  Output   3000 ml  Net  -2940 ml   Filed Weights   01/12/15 1123 01/24/15 0306  Weight: 57.97 kg (127 lb 12.8 oz) 61.9 kg (136 lb 7.4 oz)     Exam:  General exam: Pleasant elderly frail female lying comfortably in bed. Respiratory system: Clear to auscultation. No increased work of breathing. Cardiovascular system: S1 and S2 heard, RRR. No JVD or, murmurs or pedal edema.  Gastrointestinal system: Abdomen is nondistended, soft and nontender. Surgical site staples intact. Normal bowel sounds heard. CNS: Alert and oriented. No focal deficits. Extremities: scds. Moving all extremities well.   Data Reviewed: Basic Metabolic Panel:  Recent Labs Lab 01/20/15 0600 01/21/15 0415 01/22/15 0445 01/22/15 0831 01/23/15 0522 01/26/15 0625  NA 132* 133* 124* 133* 131* 130*  K 3.9 4.0 5.4* 4.1 3.9 4.6  CL 102 105 98* 106 102 97*  CO2 21* 21* 20* 21* 20* 24  GLUCOSE 165* 157* 782* 158* 155* 94  BUN 8 19 22* 23* 19 26*  CREATININE 0.92 1.17* 1.12* 1.05* 0.83 0.83  CALCIUM 8.6* 8.8* 8.4* 8.7* 9.0 9.3  MG 2.0 1.6* 2.3 1.9  --   --   PHOS 5.8* 3.5 5.8* 3.1  --   --     Liver Function Tests:  Recent Labs Lab 01/20/15 0600 01/21/15 0415 01/22/15 0445 01/22/15 0831  AST 29 125* 32 27  ALT 25 78* 38 38  ALKPHOS 53 108 83 93  BILITOT 0.4 0.7 0.4 0.3  PROT 5.6* 4.9* 4.5* 5.0*  ALBUMIN 2.7* 2.6* 2.1* 2.3*   No results for input(s): LIPASE, AMYLASE in the last 168 hours. No results for input(s): AMMONIA in the last 168 hours. CBC:  Recent Labs Lab 01/20/15 0600 01/21/15 1232 01/22/15 0445 01/23/15 0522 01/26/15 0625  WBC 6.7 13.0* 10.9* 9.5 5.5  NEUTROABS 4.4  --   --  7.0  --   HGB 10.5* 8.8* 8.5* 9.0* 8.6*  HCT 30.6* 25.3* 25.4* 24.8* 24.5*  MCV 92.7 93.4 96.2 91.5 90.4  PLT 256 182 170 199 265   Cardiac Enzymes: No results for input(s): CKTOTAL, CKMB, CKMBINDEX, TROPONINI in the last 168 hours. BNP (last 3 results) No results for input(s): PROBNP in the last 8760 hours. CBG:  Recent Labs Lab 01/24/15 1215 01/24/15 1920 01/24/15 2323 01/25/15 0315 01/25/15 1540  GLUCAP 160* 146* 134* 156* 124*    Recent Results (from the past 240 hour(s))  Culture, Urine     Status: None  Collection Time: 01/22/15 11:03 AM  Result Value Ref Range Status   Specimen Description URINE, CATHETERIZED  Final   Special Requests NONE  Final   Culture NO GROWTH 1 DAY  Final   Report Status 01/23/2015 FINAL  Final  Culture, blood (routine x 2)     Status: None (Preliminary result)   Collection Time: 01/22/15 12:14 PM  Result Value Ref Range Status   Specimen Description BLOOD RIGHT ANTECUBITAL  Final   Special Requests BOTTLES DRAWN AEROBIC AND ANAEROBIC 10CC  Final   Culture NO GROWTH 4 DAYS  Final   Report Status PENDING  Incomplete  Culture, blood (routine x 2)     Status: None (Preliminary result)   Collection Time: 01/22/15 12:28 PM  Result Value Ref Range Status   Specimen Description BLOOD BLOOD RIGHT HAND  Final   Special Requests   Final    BOTTLES DRAWN AEROBIC AND ANAEROBIC 10CC BLUE, 5CC RED   Culture NO GROWTH 4 DAYS  Final    Report Status PENDING  Incomplete  MRSA PCR Screening     Status: Abnormal   Collection Time: 01/22/15  4:12 PM  Result Value Ref Range Status   MRSA by PCR POSITIVE (A) NEGATIVE Final    Comment:        The GeneXpert MRSA Assay (FDA approved for NASAL specimens only), is one component of a comprehensive MRSA colonization surveillance program. It is not intended to diagnose MRSA infection nor to guide or monitor treatment for MRSA infections. RESULT CALLED TO, READ BACK BY AND VERIFIED WITH: S.DOTY,RN AT 1832 BY L.PITT 01/22/15          Studies: No results found.     Scheduled Meds: . amLODipine  10 mg Oral Daily  . [START ON 01/27/2015] aspirin EC  81 mg Oral q morning - 10a  . atorvastatin  10 mg Oral Daily  . Chlorhexidine Gluconate Cloth  6 each Topical Q0600  . cloNIDine  0.1 mg Oral BID  . cycloSPORINE  1 drop Both Eyes BID  . feeding supplement  1 Container Oral TID BM  . feeding supplement (PRO-STAT SUGAR FREE 64)  30 mL Oral TID  . fluticasone  2 spray Each Nare Daily  . gabapentin  300 mg Oral TID  . heparin subcutaneous  5,000 Units Subcutaneous 3 times per day  . Influenza vac split quadrivalent PF  0.5 mL Intramuscular Tomorrow-1000  . isosorbide mononitrate  30 mg Oral Daily  . latanoprost  1 drop Both Eyes QHS  . levofloxacin  500 mg Oral Q48H  . [START ON 01/27/2015] levothyroxine  225 mcg Oral QAC breakfast  . metoprolol succinate  25 mg Oral Daily  . mometasone-formoterol  2 puff Inhalation BID  . multivitamin  5 mL Oral Daily  . mupirocin cream   Topical BID  . pantoprazole  40 mg Oral BID  . polyvinyl alcohol  1 drop Both Eyes QID  . sertraline  100 mg Oral Daily  . simethicone  80 mg Oral QID   Continuous Infusions:   Time spent: 25 min minutes.    Marcellus Scott, MD, FACP, FHM. [facility] Pager [phone]  If 7PM-7AM, please contact night-coverage [email or web address] Password [email or web address] 01/26/2015, 4:42 PM    LOS: 16 days

## 2015-01-26 NOTE — Care Management Note (Signed)
Case Management Note  Patient Details  Name: Shannon [name] MRN: [id number] Date of Birth: 1923-09-06  Subjective/Objective:                    Action/Plan: Patient does not qualify for CIR. Patient and daughter want patient to go home with home health services. CM spoke with the daughter and they want [place] at discharge. CM informed Dr Algis Liming of patient and daughters wishes. CM will continue to follow for discharge needs.   Expected Discharge Date:                  Expected Discharge Plan:  [place] (Daughter expressing SNF/REHAB )  In-House Referral:  Clinical Social Work  Discharge planning Services  CM Consult  Post Acute Care Choice:    Choice offered to:  Patient, Sibling Manufacturing engineer)  DME Arranged:    DME Agency:     HH Arranged:    [place] Agency:  [place]  Status of Service:  In process, will continue to follow  Medicare Important Message Given:  Yes-second notification given Date Medicare IM Given:    Medicare IM give by:    Date Additional Medicare IM Given:    Additional Medicare Important Message give by:     If discussed at [place] of Stay Meetings, dates discussed:    Additional Comments:  Pollie Friar, RN 01/26/2015, 2:42 PM

## 2015-01-26 NOTE — Progress Notes (Signed)
Nutrition Follow-up  DOCUMENTATION CODES:   Severe malnutrition in context of chronic illness  INTERVENTION:  - Order 30 mL Pro-Stat TID, Each supplement contains (100 kcal and 15 gm protein).  - Continue Boost Breeze TID, Each supplement contains (250 kcal and 9 gm protein).  - Order Multi-Vitamin.  - Recommend new weight measure be taken for patient.  - RD team will continue to monitor patient for needs.     NUTRITION DIAGNOSIS:   Inadequate oral intake related to dysphagia as evidenced by meal completion < 50%.  Ongoing.   GOAL:   Patient will meet greater than or equal to 90% of their needs  Not Met.   MONITOR:   PO intake, Supplement acceptance, Diet advancement, Labs, Weight trends, Skin, I & O's  REASON FOR ASSESSMENT:   Consult New TPN/TNA  ASSESSMENT:   Shannon [name] is a 81 y.o. female with h/o gastric outlet obstruction, SBO, sequela of a perforated pyloric / duodenal ulcer that she had during summer 2015. Patient presents to the ED with c/o gradually worsening, generalized abdominal pain. Symptoms onset 1 day ago. No BM since Thursday at home and unable to pass gas either. She had teeth pulled on Wed this week, pain control was with Tylenol 3 but she stopped this after Wed.  Patient was alert and communicative during follow-up visit. Patient was recently advanced from TPN to Dysphagia 1 diet with thin liquids. Patient described having a good appetite, but has been having a lot of flatus which is reducing her intake. Patient described eating about 25% of meals, and was concerned that milk was making the flatus worse. She also reports that she has been experiencing abdominal pain.   Patient is s/p exploratory laparotomy lysis of adhesions.   Patient has tried several nutrition supplements on previous hospital admissions. She is amendable to protein-supplements, and likes Boost. She has previously taken Glucerna, however she is concerned about milk-based  products making her gas symptoms worse.   Patient is severely malnourished due to chronic illness. RD student encouraged patient on the importance of eating to improve health. Will order supplements to offset poor PO intake.   Patient described cooking most of her own meals prior to admission and currently follows a puree diet at home for her dysphagia. She usually cooks and purees vegetables such as turnip roots or carrots; and also enjoys cooked greens.   Patient was being followed for potential Refeeding Risk - and labs indicate problem has been resolved.   Labs: low Heme (8.6), low Na (130), low Cl (97), high BUN (26), CBG (124-185)  Medications reviewed.  Diet Order:  DIET - DYS 1 Room service appropriate?: Yes; Fluid consistency:: Thin  Skin:  Wound (see comment) (Incision, abdomen)  Last BM:  01/25/2015  Height:   Ht Readings from Last 1 Encounters:  01/12/15 '5\' 6"'  (1.676 m)    Weight:   Wt Readings from Last 1 Encounters:  01/24/15 136 lb 7.4 oz (61.9 kg)    Ideal Body Weight:  59.1 kg  BMI:  Body mass index is 22.04 kg/(m^2).  Estimated Nutritional Needs:   Kcal:  1500-1700  Protein:  75-90 grams  Fluid:  1.5-1.7 L  EDUCATION NEEDS:   No education needs identified at this time  [name], Dietetic Intern 01/26/2015 10:52 AM

## 2015-01-26 NOTE — Progress Notes (Signed)
Patient ID: Shannon [name], female   DOB: [birth year], 81 y.o.   MRN: [id number] 6 Days Post-Op  Subjective: Pt feels ok this morning.  She is actually doing better with her dysphagia I diet over her liquids she said.  C/o gas pains.  Not being mobilized much, except for with PT.  Wants her foley catheter out.  Objective: Vital signs in last 24 hours: Temp:  [97 F (36.1 C)-99.7 F (37.6 C)] 98.7 F (37.1 C) (10/25 0930) Pulse Rate:  [58-71] 62 (10/25 0930) Resp:  [16-27] 20 (10/25 0930) BP: (156-187)/(50-67) 175/66 mmHg (10/25 0930) SpO2:  [93 %-100 %] 95 % (10/25 0930) Last BM Date: 01/25/15  Intake/Output from previous day: 10/24 0701 - 10/25 0700 In: 864 [I.V.:10; IV Piggyback:50; TPN:804] Out: 1725 [Urine:1725] Intake/Output this shift:    PE: Abd: soft, appropriately tender, +BS, ND, incision c/d/i with staples present  Lab Results:   Recent Labs  01/26/15 0625  WBC 5.5  HGB 8.6*  HCT 24.5*  PLT 265   BMET  Recent Labs  01/26/15 0625  NA 130*  K 4.6  CL 97*  CO2 24  GLUCOSE 94  BUN 26*  CREATININE 0.83  CALCIUM 9.3   PT/INR No results for input(s): LABPROT, INR in the last 72 hours. CMP     Component Value Date/Time   NA 130* 01/26/2015 0625   NA 137 11/02/2014 1029   K 4.6 01/26/2015 0625   CL 97* 01/26/2015 0625   CO2 24 01/26/2015 0625   GLUCOSE 94 01/26/2015 0625   GLUCOSE 94 11/02/2014 1029   BUN 26* 01/26/2015 0625   BUN 31* 11/02/2014 1029   CREATININE 0.83 01/26/2015 0625   CREATININE 1.3* 05/06/2014   CALCIUM 9.3 01/26/2015 0625   PROT 5.0* 01/22/2015 0831   PROT 6.8 11/02/2014 1029   ALBUMIN 2.3* 01/22/2015 0831   ALBUMIN 4.1 11/02/2014 1029   AST 27 01/22/2015 0831   ALT 38 01/22/2015 0831   ALKPHOS 93 01/22/2015 0831   BILITOT 0.3 01/22/2015 0831   BILITOT 0.3 11/02/2014 1029   GFRNONAA >60 01/26/2015 0625   GFRAA >60 01/26/2015 0625   Lipase     Component Value Date/Time   LIPASE 21* 01/10/2015 0131        Studies/Results: No results found.  Anti-infectives: Anti-infectives    Start     Dose/Rate Route Frequency Ordered Stop   01/22/15 2000  vancomycin (VANCOCIN) IVPB 750 mg/150 ml premix  Status:  Discontinued     750 mg 150 mL/hr over 60 Minutes Intravenous Every 24 hours 01/22/15 1707 01/25/15 1301   01/22/15 1800  cefTAZidime (FORTAZ) 1 g in dextrose 5 % 50 mL IVPB     1 g 100 mL/hr over 30 Minutes Intravenous Every 12 hours 01/22/15 1707     01/20/15 0600  ceFAZolin (ANCEF) IVPB 2 g/50 mL premix     2 g 100 mL/hr over 30 Minutes Intravenous On call to O.R. 01/19/15 1251 01/20/15 1151   01/10/15 2200  amoxicillin (AMOXIL) 250 MG/5ML suspension 250 mg  Status:  Discontinued     250 mg Oral Every 12 hours 01/10/15 1703 01/10/15 1704   01/10/15 2200  amoxicillin (AMOXIL) 250 MG/5ML suspension 250 mg  Status:  Discontinued     250 mg Per Tube Every 12 hours 01/10/15 1704 01/10/15 1725   01/10/15 1730  amoxicillin (AMOXIL) 250 MG/5ML suspension 500 mg  Status:  Discontinued     500 mg Per Tube 3 times per day  01/10/15 1725 01/13/15 1333       Assessment/Plan  SBO POD#7 exploratory laparotomy, LOA---Dr. Magnus Ivan -tolerating dysphagia I diet well -mobilize and pulmonary toilet(pulling IS) -pt needs to ambulate in hall 3-4 times a day with staff and not just PT -dc foley today, since epidural out.  Bedside commode as patient doesn't feel like she can get up and walk to the bathroom in order to void. -oral pain meds ordered FEN-cont dysphagia I PCM-off tna, cont TID Resource breeze for continued nutritional support VTE prophylaxis-SCD/heparin ID-fortaz/vanc for suspected PNA, per primary service Dispo-CIR felt not appropriate by their evaluator, recommended SNF vs home with [facility] and significant support.  Suspect the patient will really need SNF, but defer to primary service   LOS: 16 days    Alvira Hecht E 01/26/2015, 10:07 AM Pager: [phone]

## 2015-01-26 NOTE — Clinical Social Work Note (Signed)
Clinical Social Work Assessment  Patient Details  Name: Shannon [name] MRN: [id number] Date of Birth: Aug 11, 1923  Date of referral:  01/26/15               Reason for consult:  Facility Placement                Permission sought to share information with:  Facility Sport and exercise psychologist, Family Supports Permission granted to share information::  No (patient is alert though not oriented- daughter Warehouse manager)  Name::        Agency::   [facility] SNFs- patient has hx of STR at [facility]- not interested in going back)  Relationship::     Contact Information:     Housing/Transportation Living arrangements for the past 2 months:  [place] of Information:  Patient, Adult Children (daughter Shannon [name]) Patient Interpreter Needed:  None Criminal Activity/Legal Involvement Pertinent to Current Situation/Hospitalization:    Significant Relationships:  Adult Children Lives with:  Adult Children Do you feel safe going back to the place where you live?  No Need for family participation in patient care:  Yes (Comment) (patient alert though not oriented)  Care giving concerns:  Patient daughter currently main caregiver for patient.  Patient daughter is requesting patient have STR at SNF at time of discharge to regain strength.   Social Worker assessment / plan:  CSW was consulted for possible disposition assistance.  PT is recommending CIR which patient is now doing "too well" for- ambulating min guard 200 feet.  Daughter is requesting patient receive STR prior to returning home.  Patient has been to [facility] in the past and does not wish to return to that specific facility.  PT to continue to work with patient as patient may be appropriate for home health services at time of discharge.  CSW left additional message with daughter to determine feasibility of home with home health at time of discharge.    Employment status:  Retired Forensic scientist:   Medicare PT Recommendations:  Inpatient [place] (CIR states patient is doing "too well" for CIR. Patient is ambulating 200 feet min guard) Information / Referral to community resources:  Acute Rehab, [place]  Patient/Family's Response to care:  Family is agreeable to SNF; however ambulations 200+ feet min guard may be appropriate for home at this time.  Patient/Family's Understanding of and Emotional Response to Diagnosis, Current Treatment, and Prognosis:  Patient and family are realistic regarding patient's needed therapies at time of discharge.  Emotional Assessment Appearance:  Appears stated age Attitude/Demeanor/Rapport:   (appropriate) Affect (typically observed):  Accepting Orientation:  Oriented to Self, Fluctuating Orientation (Suspected and/or reported Sundowners) Alcohol / Substance use:  Not Applicable Psych involvement (Current and /or in the community):  No (Comment)  Discharge Needs  Concerns to be addressed:  No discharge needs identified Readmission within the last 30 days:  No Current discharge risk:  None Barriers to Discharge:  No Barriers Identified   Dulcy Fanny, LCSW 01/26/2015, 10:24 AM

## 2015-01-26 NOTE — Progress Notes (Signed)
Rehab Admissions Coordinator Note:  Patient was screened by Retta Diones for appropriateness for an Inpatient Acute Rehab Consult.  At this time, we are recommending [place] or [facility] with family support.  Has progressed to min guard assist ambulating 100 ft.  Is doing too well for acute inpatient rehab admission.  Jodell Cipro M 01/26/2015, 10:00 AM  I can be reached at [phone].

## 2015-01-26 NOTE — Progress Notes (Addendum)
Speech Language Pathology Treatment: Dysphagia  Patient Details Name: Jazyiah Yiu MRN: [id number] DOB: January 04, 1924 Today's Date: 01/26/2015 Time: 8338-2505 SLP Time Calculation (min) (ACUTE ONLY): 13 min  Assessment / Plan / Recommendation Clinical Impression  Skilled treatment session focused on addressing dysphagia goals.  SLP facilitated session with skilled observation of patient consuming Dys.1 textures and thin liquids via straw after set-up from nursing.  Patient independently consumed PO in small portions at a slow pace with delayed throat clears that are present during PO intake and at baseline.  Patient reports being at baseline level of functioning; SLP answered questions regarding different types of pureed food patient could try to expand her diet.  As result, no further skilled SLP services are warranted at this time; SLP signing off.    HPI Other Pertinent Information: 81 yo female adm to [facility] with female with history of remote gastrojejunostomy & suspected vagotomy, status post GJ anastomosis resection and GJ revision after perforation of anastomotic ulcer 09/2013, G00 01/2014 with EGD 2: #1 with no anatomic outlet obstruction and normal looking pylorus/duodenum, #2: To breakup bezoar, CT 03/12/14: Recurrent bezoar, thickened pyloric channel, EGD 03/19/14: Tortuous/narrowed anatomy at prepylorus/pylorus but no stricture or ulcer, UGI/SBFT 03/25/14: Normal, CT scan 05/21/14: Gastric distention, stomach filled with fluid/food and thickened pyloric wall, barium swallow 05/22/14: Small esophageal hernia, barium trickled into duodenum, eventually assessed to have functional gastric outlet obstruction (follows with [facility]) presented with 4 day history of progressively worsening abdominal pain. Last BM-small one on 10/8, no flatus since 4 days. In ED, CT abdomen suggestive of PSBO versus ileus. Multiple (3) attempts to place NG tube on floor unsuccessful. IR consulted for fluoroscopic guided NG  tube placement. CCS consulted.  Pt initally declined to have surgery, but agreed and is s/p laproscopic surgery 10/19.  Post surgery complicated by hypotension, fever spike and pt was diagnosed with pna.  Swallow evaluation ordered.  Pt does have h/o a CVA but denies speech/swallow changes with cva.  She admits to slightly worsening swallow ability currently which she attributes to recent NG tube. Premorbid choking on drinks reported by pt.      Pertinent Vitals Pain Assessment: No/denies pain  SLP Plan  All goals met    Recommendations Diet recommendations: Dysphagia 1 (puree);Thin liquid Liquids provided via: Cup;Straw Medication Administration: Whole meds with puree Supervision: Patient able to self feed;Intermittent supervision to cue for compensatory strategies Compensations: Slow rate;Small sips/bites Postural Changes and/or Swallow Maneuvers: Seated upright 90 degrees;Upright 30-60 min after meal              Oral Care Recommendations: Oral care BID Follow up Recommendations: None Plan: All goals met    GO    Carmelia Roller., CCC-SLP [phone]  Mutasim Tuckey 01/26/2015, 11:14 AM

## 2015-01-27 ENCOUNTER — Encounter (HOSPITAL_COMMUNITY): Payer: Self-pay

## 2015-01-27 ENCOUNTER — Emergency Department (HOSPITAL_COMMUNITY): Payer: Medicare Other

## 2015-01-27 ENCOUNTER — Inpatient Hospital Stay (HOSPITAL_COMMUNITY)
Admission: EM | Admit: 2015-01-27 | Discharge: 2015-01-29 | DRG: 644 | Disposition: A | Discharge status: Home / Self Care | Payer: Medicare Other | Attending: Internal Medicine | Admitting: Internal Medicine

## 2015-01-27 DIAGNOSIS — E1140 Type 2 diabetes mellitus with diabetic neuropathy, unspecified: Secondary | ICD-10-CM | POA: Diagnosis not present

## 2015-01-27 DIAGNOSIS — J189 Pneumonia, unspecified organism: Secondary | ICD-10-CM

## 2015-01-27 DIAGNOSIS — E1151 Type 2 diabetes mellitus with diabetic peripheral angiopathy without gangrene: Secondary | ICD-10-CM | POA: Diagnosis present

## 2015-01-27 DIAGNOSIS — R1030 Lower abdominal pain, unspecified: Secondary | ICD-10-CM | POA: Diagnosis not present

## 2015-01-27 DIAGNOSIS — E871 Hypo-osmolality and hyponatremia: Secondary | ICD-10-CM | POA: Diagnosis present

## 2015-01-27 DIAGNOSIS — F329 Major depressive disorder, single episode, unspecified: Secondary | ICD-10-CM | POA: Diagnosis present

## 2015-01-27 DIAGNOSIS — Z6820 Body mass index (BMI) 20.0-20.9, adult: Secondary | ICD-10-CM

## 2015-01-27 DIAGNOSIS — Z66 Do not resuscitate: Secondary | ICD-10-CM | POA: Diagnosis present

## 2015-01-27 DIAGNOSIS — E222 Syndrome of inappropriate secretion of antidiuretic hormone: Secondary | ICD-10-CM | POA: Diagnosis not present

## 2015-01-27 DIAGNOSIS — Z9049 Acquired absence of other specified parts of digestive tract: Secondary | ICD-10-CM

## 2015-01-27 DIAGNOSIS — E860 Dehydration: Secondary | ICD-10-CM | POA: Diagnosis present

## 2015-01-27 DIAGNOSIS — E119 Type 2 diabetes mellitus without complications: Secondary | ICD-10-CM

## 2015-01-27 DIAGNOSIS — Z8673 Personal history of transient ischemic attack (TIA), and cerebral infarction without residual deficits: Secondary | ICD-10-CM | POA: Diagnosis present

## 2015-01-27 DIAGNOSIS — I10 Essential (primary) hypertension: Secondary | ICD-10-CM | POA: Diagnosis present

## 2015-01-27 DIAGNOSIS — K565 Intestinal adhesions [bands] with obstruction (postprocedural) (postinfection): Secondary | ICD-10-CM | POA: Diagnosis not present

## 2015-01-27 DIAGNOSIS — E1122 Type 2 diabetes mellitus with diabetic chronic kidney disease: Secondary | ICD-10-CM | POA: Diagnosis present

## 2015-01-27 DIAGNOSIS — K5901 Slow transit constipation: Secondary | ICD-10-CM

## 2015-01-27 DIAGNOSIS — E785 Hyperlipidemia, unspecified: Secondary | ICD-10-CM | POA: Diagnosis present

## 2015-01-27 DIAGNOSIS — J449 Chronic obstructive pulmonary disease, unspecified: Secondary | ICD-10-CM | POA: Diagnosis not present

## 2015-01-27 DIAGNOSIS — E440 Moderate protein-calorie malnutrition: Secondary | ICD-10-CM | POA: Diagnosis present

## 2015-01-27 DIAGNOSIS — E039 Hypothyroidism, unspecified: Secondary | ICD-10-CM | POA: Diagnosis present

## 2015-01-27 DIAGNOSIS — D649 Anemia, unspecified: Secondary | ICD-10-CM | POA: Diagnosis present

## 2015-01-27 DIAGNOSIS — Z9889 Other specified postprocedural states: Secondary | ICD-10-CM

## 2015-01-27 DIAGNOSIS — K219 Gastro-esophageal reflux disease without esophagitis: Secondary | ICD-10-CM | POA: Diagnosis present

## 2015-01-27 DIAGNOSIS — Z87891 Personal history of nicotine dependence: Secondary | ICD-10-CM

## 2015-01-27 DIAGNOSIS — R55 Syncope and collapse: Secondary | ICD-10-CM | POA: Diagnosis not present

## 2015-01-27 DIAGNOSIS — Z7982 Long term (current) use of aspirin: Secondary | ICD-10-CM

## 2015-01-27 DIAGNOSIS — J45909 Unspecified asthma, uncomplicated: Secondary | ICD-10-CM | POA: Diagnosis present

## 2015-01-27 DIAGNOSIS — N179 Acute kidney failure, unspecified: Secondary | ICD-10-CM | POA: Diagnosis not present

## 2015-01-27 DIAGNOSIS — K5900 Constipation, unspecified: Secondary | ICD-10-CM | POA: Diagnosis present

## 2015-01-27 LAB — HEPATIC FUNCTION PANEL
ALT: 52 U/L (ref 14–54)
AST: 46 U/L — ABNORMAL HIGH (ref 15–41)
Albumin: 2.6 g/dL — ABNORMAL LOW (ref 3.5–5.0)
Alkaline Phosphatase: 68 U/L (ref 38–126)
Bilirubin, Direct: 0.2 mg/dL (ref 0.1–0.5)
Indirect Bilirubin: 0 mg/dL — ABNORMAL LOW (ref 0.3–0.9)
Total Bilirubin: 0.2 mg/dL — ABNORMAL LOW (ref 0.3–1.2)
Total Protein: 6.3 g/dL — ABNORMAL LOW (ref 6.5–8.1)

## 2015-01-27 LAB — URINALYSIS, ROUTINE W REFLEX MICROSCOPIC
Bilirubin Urine: NEGATIVE
Glucose, UA: NEGATIVE mg/dL
Hgb urine dipstick: NEGATIVE
Ketones, ur: NEGATIVE mg/dL
Leukocytes, UA: NEGATIVE
Nitrite: NEGATIVE
Protein, ur: NEGATIVE mg/dL
Specific Gravity, Urine: 1.02 (ref 1.005–1.030)
Urobilinogen, UA: 0.2 mg/dL (ref 0.0–1.0)
pH: 5 (ref 5.0–8.0)

## 2015-01-27 LAB — CULTURE, BLOOD (ROUTINE X 2)
Culture: NO GROWTH
Culture: NO GROWTH

## 2015-01-27 LAB — BASIC METABOLIC PANEL
Anion gap: 11 (ref 5–15)
Anion gap: 10 (ref 5–15)
BUN: 32 mg/dL — ABNORMAL HIGH (ref 6–20)
BUN: 49 mg/dL — ABNORMAL HIGH (ref 6–20)
CO2: 22 mmol/L (ref 22–32)
CO2: 20 mmol/L — ABNORMAL LOW (ref 22–32)
Calcium: 8.9 mg/dL (ref 8.9–10.3)
Calcium: 8.5 mg/dL — ABNORMAL LOW (ref 8.9–10.3)
Chloride: 96 mmol/L — ABNORMAL LOW (ref 101–111)
Chloride: 96 mmol/L — ABNORMAL LOW (ref 101–111)
Creatinine, Ser: 0.88 mg/dL (ref 0.44–1.00)
Creatinine, Ser: 1.16 mg/dL — ABNORMAL HIGH (ref 0.44–1.00)
GFR calc Af Amer: >60 mL/min (ref >60)
GFR calc Af Amer: 47 mL/min — ABNORMAL LOW (ref >60)
GFR calc non Af Amer: 57 mL/min — ABNORMAL LOW (ref >60)
GFR calc non Af Amer: 40 mL/min — ABNORMAL LOW (ref >60)
Glucose, Bld: 116 mg/dL — ABNORMAL HIGH (ref 65–99)
Glucose, Bld: 119 mg/dL — ABNORMAL HIGH (ref 65–99)
Potassium: 4 mmol/L (ref 3.5–5.1)
Potassium: 4.1 mmol/L (ref 3.5–5.1)
Sodium: 129 mmol/L — ABNORMAL LOW (ref 135–145)
Sodium: 126 mmol/L — ABNORMAL LOW (ref 135–145)

## 2015-01-27 LAB — CBC
HCT: 23.7 % — ABNORMAL LOW (ref 36.0–46.0)
HCT: 25.8 % — ABNORMAL LOW (ref 36.0–46.0)
Hemoglobin: 8.2 g/dL — ABNORMAL LOW (ref 12.0–15.0)
Hemoglobin: 8.8 g/dL — ABNORMAL LOW (ref 12.0–15.0)
MCH: 31.8 pg (ref 26.0–34.0)
MCH: 31.3 pg (ref 26.0–34.0)
MCHC: 34.6 g/dL (ref 30.0–36.0)
MCHC: 34.1 g/dL (ref 30.0–36.0)
MCV: 91.9 fL (ref 78.0–100.0)
MCV: 91.8 fL (ref 78.0–100.0)
Platelets: 288 10*3/uL (ref 150–400)
Platelets: 298 10*3/uL (ref 150–400)
RBC: 2.58 MIL/uL — ABNORMAL LOW (ref 3.87–5.11)
RBC: 2.81 MIL/uL — ABNORMAL LOW (ref 3.87–5.11)
RDW: 14.2 % (ref 11.5–15.5)
RDW: 14.2 % (ref 11.5–15.5)
WBC: 5.1 10*3/uL (ref 4.0–10.5)
WBC: 7 10*3/uL (ref 4.0–10.5)

## 2015-01-27 LAB — CBG MONITORING, ED: Glucose-Capillary: 112 mg/dL — ABNORMAL HIGH (ref 65–99)

## 2015-01-27 MED ORDER — BOOST / RESOURCE BREEZE PO LIQD: 1.0000 | Freq: Three times a day (TID) | ORAL | Status: DC

## 2015-01-27 MED ORDER — POLYETHYLENE GLYCOL 3350 17 G PO PACK
17.0000 g | PACK | Freq: Two times a day (BID) | ORAL | Status: DC
  Administered 2015-01-28 (×2): 17 g via ORAL
  Filled 2015-01-27 (×2): qty 1

## 2015-01-27 MED ORDER — PRO-STAT SUGAR FREE PO LIQD: 30.0000 mL | Freq: Three times a day (TID) | ORAL | Status: DC

## 2015-01-27 MED ORDER — SODIUM CHLORIDE 0.9 % IV BOLUS (SEPSIS)
1000.0000 mL | Freq: Once | INTRAVENOUS | Status: AC
  Administered 2015-01-27: 1000 mL via INTRAVENOUS

## 2015-01-27 MED ORDER — LEVOFLOXACIN 500 MG PO TABS: 500.0000 mg | ORAL_TABLET | ORAL | Status: DC

## 2015-01-27 MED ORDER — SIMETHICONE 80 MG PO CHEW: 80.0000 mg | CHEWABLE_TABLET | Freq: Four times a day (QID) | ORAL | Status: DC | PRN

## 2015-01-27 NOTE — Progress Notes (Signed)
Patient ID: Shannon [name], female   DOB: July 18, 1923, 81 y.o.   MRN: [id number] 7 Days Post-Op  Subjective: Pt feels well this morning.  Tolerating solid diet.  Pain well controlled  Objective: Vital signs in last 24 hours: Temp:  [98 F (36.7 C)-98.8 F (37.1 C)] 98.2 F (36.8 C) (10/26 0430) Pulse Rate:  [55-76] 55 (10/26 0430) Resp:  [16-20] 18 (10/26 0430) BP: (113-184)/(48-66) 115/52 mmHg (10/26 0430) SpO2:  [93 %-100 %] 100 % (10/26 0430) Last BM Date: 01/25/15  Intake/Output from previous day: 10/25 0701 - 10/26 0700 In: 360 [P.O.:360] Out: 1700 [Urine:1700] Intake/Output this shift:    PE: Abd: soft, appropriately tender, +BS, ND, incision c/d/i with staples  Lab Results:   Recent Labs  01/26/15 0625 01/27/15 0410  WBC 5.5 5.1  HGB 8.6* 8.2*  HCT 24.5* 23.7*  PLT 265 288   BMET  Recent Labs  01/26/15 0625 01/27/15 0410  NA 130* 129*  K 4.6 4.0  CL 97* 96*  CO2 24 22  GLUCOSE 94 116*  BUN 26* 32*  CREATININE 0.83 0.88  CALCIUM 9.3 8.9   PT/INR No results for input(s): LABPROT, INR in the last 72 hours. CMP     Component Value Date/Time   NA 129* 01/27/2015 0410   NA 137 11/02/2014 1029   K 4.0 01/27/2015 0410   CL 96* 01/27/2015 0410   CO2 22 01/27/2015 0410   GLUCOSE 116* 01/27/2015 0410   GLUCOSE 94 11/02/2014 1029   BUN 32* 01/27/2015 0410   BUN 31* 11/02/2014 1029   CREATININE 0.88 01/27/2015 0410   CREATININE 1.3* 05/06/2014   CALCIUM 8.9 01/27/2015 0410   PROT 5.0* 01/22/2015 0831   PROT 6.8 11/02/2014 1029   ALBUMIN 2.3* 01/22/2015 0831   ALBUMIN 4.1 11/02/2014 1029   AST 27 01/22/2015 0831   ALT 38 01/22/2015 0831   ALKPHOS 93 01/22/2015 0831   BILITOT 0.3 01/22/2015 0831   BILITOT 0.3 11/02/2014 1029   GFRNONAA 57* 01/27/2015 0410   GFRAA >60 01/27/2015 0410   Lipase     Component Value Date/Time   LIPASE 21* 01/10/2015 0131       Studies/Results: No results found.  Anti-infectives: Anti-infectives    Start      Dose/Rate Route Frequency Ordered Stop   01/26/15 1800  levofloxacin (LEVAQUIN) tablet 500 mg     500 mg Oral Every 48 hours 01/26/15 1640 01/30/15 1759   01/22/15 2000  vancomycin (VANCOCIN) IVPB 750 mg/150 ml premix  Status:  Discontinued     750 mg 150 mL/hr over 60 Minutes Intravenous Every 24 hours 01/22/15 1707 01/25/15 1301   01/22/15 1800  cefTAZidime (FORTAZ) 1 g in dextrose 5 % 50 mL IVPB  Status:  Discontinued     1 g 100 mL/hr over 30 Minutes Intravenous Every 12 hours 01/22/15 1707 01/26/15 1636   01/20/15 0600  ceFAZolin (ANCEF) IVPB 2 g/50 mL premix     2 g 100 mL/hr over 30 Minutes Intravenous On call to O.R. 01/19/15 1251 01/20/15 1151   01/10/15 2200  amoxicillin (AMOXIL) 250 MG/5ML suspension 250 mg  Status:  Discontinued     250 mg Oral Every 12 hours 01/10/15 1703 01/10/15 1704   01/10/15 2200  amoxicillin (AMOXIL) 250 MG/5ML suspension 250 mg  Status:  Discontinued     250 mg Per Tube Every 12 hours 01/10/15 1704 01/10/15 1725   01/10/15 1730  amoxicillin (AMOXIL) 250 MG/5ML suspension 500 mg  Status:  Discontinued     500 mg Per Tube 3 times per day 01/10/15 1725 01/13/15 1333       Assessment/Plan   SBO POD#8 exploratory laparotomy, LOA---Dr. Magnus Ivan -tolerating dysphagia I diet well -mobilize and pulmonary toilet(pulling IS) -pt surgically doing well.  Stable from our standpoint for DC when medically stable FEN-cont dysphagia I PCM-off tna, cont TID Resource breeze for continued nutritional support VTE prophylaxis-SCD/heparin Dispo- ok for DC home from our standpoint.  Follow up being arranged by our office  LOS: 17 days    Shannon [name] E 01/27/2015, 9:16 AM Pager: [phone]

## 2015-01-27 NOTE — Care Management Note (Signed)
Case Management Note  Patient Details  Name: Altheria Vong MRN: [id number] Date of Birth: 07-Aug-1923  Subjective/Objective:                    Action/Plan:  Pt's daughter was a little confused this am about whether her mother needed rehab at SNF or was able to go home with home health.  PT reassessed patient today and recommends home with home health services. CM spoke with patient's daughter and updated her on the plan. Patients daughter had requested [facility] when CM spoke with her yesterday. CM spoke with [facility] and faxed them the required information. Bedside RN updated.    Expected Discharge Date:                  Expected Discharge Plan:  [place] (Daughter expressing SNF/REHAB )  In-House Referral:  Clinical Social Work  Discharge planning Services  CM Consult  Post Acute Care Choice:    Choice offered to:  Patient, Sibling Manufacturing engineer)  DME Arranged:    DME Agency:     HH Arranged:  PT, OT, RN, Nurse's Aide, Speech Therapy, Social Work [employer] Agency:  [place]  Status of Service:  Completed, signed off  Medicare Important Message Given:  Yes-second notification given Date Medicare IM Given:    Medicare IM give by:    Date Additional Medicare IM Given:    Additional Medicare Important Message give by:     If discussed at [place] of Stay Meetings, dates discussed:    Additional Comments:  Pollie Friar, RN 01/27/2015, 1:54 PM

## 2015-01-27 NOTE — Discharge Summary (Signed)
PATIENT DETAILS Name: Shannon [name] Age: 81 y.o. Sex: female Date of Birth: May 20, 1923 MRN: [id number]. Admitting Physician: Hillary Bow, DO YQM:VHQION, Reserve, DO  Admit Date: 01/10/2015 Discharge date: 01/27/2015  Recommendations for Outpatient Follow-up:  1. Please repeat chest x-ray in 6-8 weeks to ensure resolution of pneumonia  2. Please repeat CBC/BMET at next visit 3. Please follow blood/urine cultures till final 4. Enusre follow up with physical therapy services and speech therapy services upon discharge  PRIMARY DISCHARGE DIAGNOSIS:  Principal Problem:   SBO (small bowel obstruction) (HCC) Active Problems:   AP (abdominal pain)   SIADH (syndrome of inappropriate ADH production) (HCC)   Protein-calorie malnutrition, severe   Elevated troponin   HTN (hypertension), uncontrolled   Palliative care encounter   DNR (do not resuscitate) discussion   Bradycardia   Hypotension   Fever, unspecified   HCAP (healthcare-associated pneumonia)      PAST MEDICAL HISTORY: Past Medical History  Diagnosis Date  . Pneumonia   . Hyperlipidemia   . Hypertension   . Small bowel obstruction (HCC)     "I don't know how many" (01/11/2015)  . Ventral hernia with bowel obstruction   . Asthma   . Hyperlipidemia   . Stroke (HCC)     "light one"  . COPD (chronic obstructive pulmonary disease) (HCC)   . Hypothyroidism   . Type II diabetes mellitus (HCC)   . Anemia   . History of blood transfusion   . GERD (gastroesophageal reflux disease)   . History of stomach ulcers   . Arthritis     "all over"  . Renal insufficiency   . Depression     "light case"  . SIADH (syndrome of inappropriate ADH production) (HCC)     Shannon [name] 01/10/2015    DISCHARGE MEDICATIONS: Current Discharge Medication List    START taking these medications   Details  Amino Acids-Protein Hydrolys (FEEDING SUPPLEMENT, PRO-STAT SUGAR FREE 64,) LIQD Take 30 mLs by mouth 3 (three) times daily. Qty: 900 mL,  Refills: 0    levofloxacin (LEVAQUIN) 500 MG tablet Take 1 tablet (500 mg total) by mouth every other day. Qty: 1 tablet, Refills: 0    simethicone (MYLICON) 80 MG chewable tablet Chew 1 tablet (80 mg total) by mouth 4 (four) times daily as needed for flatulence. Qty: 30 tablet, Refills: 0      CONTINUE these medications which have CHANGED   Details  feeding supplement (BOOST / RESOURCE BREEZE) LIQD Take 1 Container by mouth 3 (three) times daily between meals. Qty: 90 Container, Refills: 0      CONTINUE these medications which have NOT CHANGED   Details  acetaminophen (TYLENOL) 325 MG tablet Take 650 mg by mouth every 6 (six) hours as needed for mild pain or fever.    ADVAIR DISKUS 250-50 MCG/DOSE AEPB INHALE 1 PUFF INTO THE LUNGS 2 (TWO) TIMES DAILY AS NEEDED FOR SHORTNESS OF BREATH Refills: 3    albuterol (PROVENTIL HFA;VENTOLIN HFA) 108 (90 BASE) MCG/ACT inhaler Inhale 2 puffs into the lungs every 6 (six) hours as needed for wheezing or shortness of breath. Qty: 1 Inhaler, Refills: 2   Associated Diagnoses: Essential hypertension, benign; Chronic atrial fibrillation (HCC)    amLODipine (NORVASC) 10 MG tablet Take 1 tablet (10 mg total) by mouth daily. For HTN Qty: 30 tablet, Refills: 3    aspirin EC 81 MG tablet Take 81 mg by mouth every morning.    atorvastatin (LIPITOR) 10 MG tablet TAKE 1 TABLET  BY MOUTH EVERY DAY Qty: 30 tablet, Refills: 5    b complex vitamins tablet Take 1 tablet by mouth 2 (two) times daily.    calcium-vitamin D (OSCAL WITH D) 500-200 MG-UNIT per tablet Take 1 tablet by mouth daily with breakfast.     chlorhexidine (PERIDEX) 0.12 % solution 15 mLs by Mouth Rinse route 2 (two) times daily. Qty: 120 mL, Refills: 0    cloNIDine (CATAPRES) 0.1 MG tablet Take 1 tablet (0.1 mg total) by mouth 2 (two) times daily. Qty: 60 tablet, Refills: 11    fluticasone (FLONASE) 50 MCG/ACT nasal spray Place 2 sprays into both nostrils daily. Qty: 16 g, Refills: 3      gabapentin (NEURONTIN) 300 MG capsule Take 1 capsule (300 mg total) by mouth 3 (three) times daily. Qty: 90 capsule, Refills: 3    isosorbide mononitrate (IMDUR) 30 MG 24 hr tablet Take one tablet by mouth once daily. If Chest Pains occur increase to two tablets daily. Qty: 90 tablet, Refills: 3   Associated Diagnoses: Essential hypertension, benign; Chronic atrial fibrillation (HCC)    lactulose (CHRONULAC) 10 GM/15ML solution Take 15 mLs (10 g total) by mouth daily as needed for mild constipation. Qty: 240 mL, Refills: 0    latanoprost (XALATAN) 0.005 % ophthalmic solution Place 1 drop into both eyes at bedtime. Qty: 2.5 mL, Refills: 12    !! levothyroxine (SYNTHROID, LEVOTHROID) 100 MCG tablet Take 1 tablet (100 mcg total) by mouth daily. Take with dose (total ) Qty: 30 tablet, Refills: 3   Associated Diagnoses: Hypothyroidism, unspecified hypothyroidism type    !! levothyroxine (SYNTHROID, LEVOTHROID) 125 MCG tablet Take 1 tablet (125 mcg total) by mouth daily before breakfast. Take with dose (total ) Qty: 30 tablet, Refills: 3   Associated Diagnoses: Hypothyroidism, unspecified hypothyroidism type    metoprolol succinate (TOPROL-XL) 25 MG 24 hr tablet Take 1 tablet (25 mg total) by mouth daily. Qty: 30 tablet, Refills: 3    Multiple Vitamins-Minerals (MULTIVITAMIN WITH MINERALS) tablet Take 1 tablet by mouth every morning.     pantoprazole (PROTONIX) 40 MG tablet TAKE 1 TABLET BY MOUTH TWICE A DAY Qty: 60 tablet, Refills: 11    Polyethyl Glycol-Propyl Glycol 0.4-0.3 % SOLN Place 1 drop into both eyes 4 (four) times daily.     polyethylene glycol (MIRALAX / GLYCOLAX) packet Take 17 g by mouth 2 (two) times daily.    RESTASIS 0.05 % ophthalmic emulsion USE 1 DROP INTO BOTH EYES TWICE DAILY Qty: 60 mL, Refills: 1    sennosides-docusate sodium (SENOKOT-S) 8.6-50 MG tablet Take 1 tablet by mouth 2 (two) times daily.     sertraline (ZOLOFT) 100 MG  tablet Take one tablet by mouth once daily for depression Qty: 30 tablet, Refills: 3    sodium chloride 1 G tablet TAKE 1 TABLET BY MOUTH THREE TIMES DAILY Qty: 90 tablet, Refills: 2    traMADol (ULTRAM) 50 MG tablet Take by mouth 2 (two) times daily as needed for moderate pain.     !! - Potential duplicate medications found. Please discuss with provider.    STOP taking these medications     hydrALAZINE (APRESOLINE) 100 MG tablet      L-Methylfolate-Algae-B12-B6 (METANX) 3-90.314-2-35 MG CAPS         ALLERGIES:  No Known Allergies  BRIEF HPI:  See H&P, Labs, Consult and Test reports for all details in brief, patient was admitted for  small bowel obstruction, initially patient declined surgery, however since  small bowel obstruction did not resolve with conservative measures-she underwent exploratory laparotomy on 10/19.   CONSULTATIONS:  Consultants:  Gen. Surgery   Palliative-signed off 10/14  Cardiology-signed off  Rehabilitation M.D.  PERTINENT RADIOLOGIC STUDIES: Dg Abd 1 View  01/11/2015  CLINICAL DATA:  Small bowel obstruction EXAM: ABDOMEN - 1 VIEW COMPARISON:  CT 01/10/2015 FINDINGS: Prior cholecystectomy. NG tube is in the stomach. No gas-filled dilated small bowel loops noted. No free air organomegaly. No acute bony abnormality. IMPRESSION: No gas-filled dilated small bowel loops noted. NG tube remains present in the stomach. Electronically Signed   By: Charlett Nose M.D.   On: 01/11/2015 08:52   Dg Abd 1 View  01/10/2015  CLINICAL DATA:  Assess nasogastric placement. EXAM: ABDOMEN - 1 VIEW COMPARISON:  None. FINDINGS: Nasogastric tube enters the stomach, passes to the fundus and along the greater curvature with its tip in the antrum. IMPRESSION: Nasogastric tube tip at the gastric antrum. Electronically Signed   By: Paulina Fusi M.D.   On: 01/10/2015 14:15   Ct Abdomen Pelvis W Contrast  01/10/2015  CLINICAL DATA:  Acute onset of nausea, vomiting and generalized  abdominal pain. Initial encounter. EXAM: CT ABDOMEN AND PELVIS WITH CONTRAST TECHNIQUE: Multidetector CT imaging of the abdomen and pelvis was performed using the standard protocol following bolus administration of intravenous contrast. CONTRAST:  75mL OMNIPAQUE IOHEXOL 300 MG/ML  SOLN COMPARISON:  CT of the abdomen and pelvis performed 05/21/2014 FINDINGS: Minimal right basilar atelectasis is noted. Calcification is noted at the aortic valve. There is distention of multiple small bowel loops to 3.4 cm in maximal diameter. Trace associated free fluid is seen. Multiple foci of decompression are seen, with decompression of the distal ileum. This likely reflects small bowel dysmotility, though partial small-bowel obstruction cannot be excluded. A small bowel anastomosis at the left mid abdomen is grossly unremarkable. There is slight extension of a wall of the transverse colon into a tiny right periumbilical hernia, without evidence for incarceration. A tiny left periumbilical hernia is also seen, containing only fat. Scattered small hypodensities within the liver are nonspecific but may reflect small cysts. The spleen is unremarkable in appearance. The patient is status post cholecystectomy, with clips noted at the gallbladder fossa. The pancreas and adrenal glands are unremarkable. Tiny bilateral renal cysts are seen. The kidneys are otherwise unremarkable. There is no evidence of hydronephrosis. No renal or ureteral stones are seen. No perinephric stranding is appreciated. The stomach is within normal limits. No acute vascular abnormalities are seen. Diffuse calcification is noted along the abdominal aorta and its branches. The appendix is not definitely seen; there is no evidence for appendicitis. Scattered diverticulosis is noted along the entirety of the colon, without evidence of diverticulitis. The bladder is mildly distended and grossly unremarkable. The uterus is unremarkable in appearance. The right ovary is  unremarkable in appearance. No suspicious adnexal masses are seen. No inguinal lymphadenopathy is seen. No acute osseous abnormalities are identified. There is mild chronic compression deformity of vertebral body T11. IMPRESSION: 1. Distention of multiple small bowel loops to 3.4 cm in maximal diameter, with trace associated free fluid. Multiple foci of decompression noted along the small bowel, with decompression of the distal ileum. This likely reflects some degree of small bowel dysmotility, though partial small bowel obstruction cannot be excluded. 2. Slight extension of the wall of the transverse colon into a tiny right periumbilical hernia, without evidence for incarceration. Tiny left periumbilical hernia also seen, containing only fat. 3.  Diffuse calcification along the abdominal aorta and its branches. 4. Scattered diverticulosis along the entirety of the colon, without evidence of diverticulitis. 5. Calcification at the aortic valve. Electronically Signed   By: Roanna Raider M.D.   On: 01/10/2015 03:58   Dg Chest Port 1 View  01/22/2015  CLINICAL DATA:  Cough and fever.  Small bowel obstruction. EXAM: PORTABLE CHEST 1 VIEW COMPARISON:  03/27/2014 FINDINGS: A left subclavian central venous catheter is seen with tip overlying the mid to distal SVC. No evidence of pneumothorax. Nasogastric tube seen entering the stomach. New bibasilar pulmonary opacity may be due to atelectasis or infiltrates. Mild left pleural thickening versus small pleural effusion remains stable. Cardiomegaly is also stable. IMPRESSION: Support apparatus in appropriate position. No evidence of pneumothorax. New bibasilar infiltrates versus atelectasis. Stable mild left pleural thickening versus effusion. Stable cardiomegaly. Electronically Signed   By: Myles Rosenthal M.D.   On: 01/22/2015 13:06   Dg Abd 2 Views  01/18/2015  CLINICAL DATA:  Small bowel obstruction.  Enteric tube. EXAM: ABDOMEN - 2 VIEW COMPARISON:  Abdominal  radiograph from 1 day prior. FINDINGS: Enteric tube loops in the proximal stomach and terminates in the body of the stomach. Surgical sutures are again seen in the left upper quadrant. Mildly to moderately dilated small bowel loops in the central and right abdomen measuring up to 4.4 cm diameter, not appreciably changed. Mild stool throughout the colon. No evidence of pneumatosis or pneumoperitoneum. IMPRESSION: 1. Enteric tube loops in the proximal stomach and terminates in the body of the stomach. 2. No appreciable change in mild-to-moderate small bowel dilatation, in keeping with persistent distal small bowel obstruction. Electronically Signed   By: Delbert Phenix M.D.   On: 01/18/2015 07:58   Dg Abd 2 Views  01/17/2015  CLINICAL DATA:  SBO; pt states she is feeling better today due to flatulence this morning EXAM: ABDOMEN - 2 VIEW COMPARISON:  01/15/2015 FINDINGS: Upright and supine views. The upright view demonstrates a nasogastric tube looped within the body of the stomach. Multiple leads and wires projecting over the abdomen. Multiple small bowel air-fluid levels. No free intraperitoneal air. The supine image demonstrates small bowel distension, example loop 3.8 cm. Compare 4.0 cm at the same level on the prior. small volume colonic gas. No pneumatosis. IMPRESSION: Similar partial small bowel obstruction. No free intraperitoneal air or other acute complication. Electronically Signed   By: Jeronimo Greaves M.D.   On: 01/17/2015 09:08   Dg Abd 2 Views  01/15/2015  CLINICAL DATA:  81 year old female with abdominal pain and distention for 6 days. EXAM: ABDOMEN - 2 VIEW COMPARISON:  01/13/2015 FINDINGS: Increasing small bowel distention noted compatible with small bowel obstruction. There is no evidence of pneumoperitoneum. An NG tube with tip overlying the mid stomach is noted. Cholecystectomy clips are present. No acute bony abnormalities are identified. IMPRESSION: Increasing small bowel distention compatible  small bowel obstruction. No evidence of pneumoperitoneum. NG tube with tip overlying the mid stomach. Electronically Signed   By: Harmon Pier M.D.   On: 01/15/2015 13:04   Dg Abd 2 Views  01/13/2015  CLINICAL DATA:  Follow-up small bowel obstruction EXAM: ABDOMEN - 2 VIEW COMPARISON:  01/11/2015 FINDINGS: There are distended small bowel loops in right abdomen and left upper abdomen highly suspicious for bowel obstruction. Paucity of bowel gas in left lower abdomen and pelvis. IMPRESSION: Gaseous distended small bowel loops in right abdomen and upper abdomen highly suspicious for bowel obstruction. Paucity of bowel  gas in left abdomen and pelvis Electronically Signed   By: Natasha Mead M.D.   On: 01/13/2015 12:39   Dg Abd Portable 1v  01/19/2015  CLINICAL DATA:  Small bowel obstruction EXAM: PORTABLE ABDOMEN - 1 VIEW COMPARISON:  01/18/2015 FINDINGS: Disproportionally dilated loops of bowel are stable. There is some colonic gas. Stable NG tube in the body of the stomach. IMPRESSION: Stable partial small bowel obstruction pattern. Electronically Signed   By: Jolaine Click M.D.   On: 01/19/2015 09:32   Dg Abd Portable 1v-small Bowel Obstruction Protocol-initial, 8 Hr Delay  01/11/2015  CLINICAL DATA:  81 year old female with small bowel obstruction. Initial encounter. EXAM: PORTABLE ABDOMEN - 1 VIEW COMPARISON:  0831 hr today and earlier. FINDINGS: Portable AP supine view at 2228 hrs. Stable in NG tube looped in the stomach, side hole the level of the gastric body. Stable cholecystectomy clips. Staple lines re- identified in the left upper abdomen. Continued paucity of bowel gas. Still, mildly dilated up to 3-3.5 cm bowel small bowel loops are evident in the left abdomen. Paucity of colonic gas as before. Stable visualized osseous structures. IMPRESSION: 1. Stable NG tube. 2. Continued paucity of bowel gas, including colon gas. Residual mildly dilated (approximately 3 cm) small bowel loops evident in the left  abdomen. Electronically Signed   By: Odessa Fleming M.D.   On: 01/11/2015 23:06   Dg Basil Dess Tube Plc W/fl-no Rad  01/10/2015  CLINICAL DATA:  NASO G TUBE PLACEMENT WITH FLUORO Fluoroscopy was utilized by the requesting physician.  No radiographic interpretation.     PERTINENT LAB RESULTS: CBC:  Recent Labs  01/26/15 0625 01/27/15 0410  WBC 5.5 5.1  HGB 8.6* 8.2*  HCT 24.5* 23.7*  PLT 265 288   CMET CMP     Component Value Date/Time   NA 129* 01/27/2015 0410   NA 137 11/02/2014 1029   K 4.0 01/27/2015 0410   CL 96* 01/27/2015 0410   CO2 22 01/27/2015 0410   GLUCOSE 116* 01/27/2015 0410   GLUCOSE 94 11/02/2014 1029   BUN 32* 01/27/2015 0410   BUN 31* 11/02/2014 1029   CREATININE 0.88 01/27/2015 0410   CREATININE 1.3* 05/06/2014   CALCIUM 8.9 01/27/2015 0410   PROT 5.0* 01/22/2015 0831   PROT 6.8 11/02/2014 1029   ALBUMIN 2.3* 01/22/2015 0831   ALBUMIN 4.1 11/02/2014 1029   AST 27 01/22/2015 0831   ALT 38 01/22/2015 0831   ALKPHOS 93 01/22/2015 0831   BILITOT 0.3 01/22/2015 0831   BILITOT 0.3 11/02/2014 1029   GFRNONAA 57* 01/27/2015 0410   GFRAA >60 01/27/2015 0410    GFR Estimated Creatinine Clearance: 40.6 mL/min (by C-G formula based on Cr of 0.88). No results for input(s): LIPASE, AMYLASE in the last 72 hours. No results for input(s): CKTOTAL, CKMB, CKMBINDEX, TROPONINI in the last 72 hours. Invalid input(s): POCBNP No results for input(s): DDIMER in the last 72 hours. No results for input(s): HGBA1C in the last 72 hours. No results for input(s): CHOL, HDL, LDLCALC, TRIG, CHOLHDL, LDLDIRECT in the last 72 hours. No results for input(s): TSH, T4TOTAL, T3FREE, THYROIDAB in the last 72 hours.  Invalid input(s): FREET3 No results for input(s): VITAMINB12, FOLATE, FERRITIN, TIBC, IRON, RETICCTPCT in the last 72 hours. Coags: No results for input(s): INR in the last 72 hours.  Invalid input(s): PT Microbiology: Recent Results (from the past 240 hour(s))    Culture, Urine     Status: None   Collection Time: 01/22/15 11:03  AM  Result Value Ref Range Status   Specimen Description URINE, CATHETERIZED  Final   Special Requests NONE  Final   Culture NO GROWTH 1 DAY  Final   Report Status 01/23/2015 FINAL  Final  Culture, blood (routine x 2)     Status: None (Preliminary result)   Collection Time: 01/22/15 12:14 PM  Result Value Ref Range Status   Specimen Description BLOOD RIGHT ANTECUBITAL  Final   Special Requests BOTTLES DRAWN AEROBIC AND ANAEROBIC 10CC  Final   Culture NO GROWTH 4 DAYS  Final   Report Status PENDING  Incomplete  Culture, blood (routine x 2)     Status: None (Preliminary result)   Collection Time: 01/22/15 12:28 PM  Result Value Ref Range Status   Specimen Description BLOOD BLOOD RIGHT HAND  Final   Special Requests   Final    BOTTLES DRAWN AEROBIC AND ANAEROBIC 10CC BLUE, 5CC RED   Culture NO GROWTH 4 DAYS  Final   Report Status PENDING  Incomplete  MRSA PCR Screening     Status: Abnormal   Collection Time: 01/22/15  4:12 PM  Result Value Ref Range Status   MRSA by PCR POSITIVE (A) NEGATIVE Final    Comment:        The GeneXpert MRSA Assay (FDA approved for NASAL specimens only), is one component of a comprehensive MRSA colonization surveillance program. It is not intended to diagnose MRSA infection nor to guide or monitor treatment for MRSA infections. RESULT CALLED TO, READ BACK BY AND VERIFIED WITH: S.DOTY,RN AT 1832 BY L.PITT 01/22/15      BRIEF HOSPITAL COURSE:  Brief narrative: Patient is a 81 year old female with a history of remote gastrojejunostomy and suspected vagotomy-status post GJ anastomosis resection and GJ revision with perforation of an stomach ulcer in 2015, history of gastric outlet obstruction-admitted with abdominal pain, nausea and vomiting. Found to have small bowel obstruction that did not resolve with conservative means. Initially patient declined surgery, however eventually agreed  to surgery and had a exploratory laparotomy on 10/19.  Hospital course by problem list:  SBO :S/p ex lap LOA. Tolerating dysphagia 1 diet. Having BMs & passing flatus. TPN weaned off. No further recommendations from general surgery-okay to discharge.  Hypertension: Initially uncontrolled-after oral medications resumed-much better controlled. Continue to optimize in the outpatient setting   Healthcare associated pneumonia: Hospital course complicated by development of healthcare associated pneumonia postoperatively. Managed successfully with vancomycin and Fortaz, much improved, afebrile. Blood cultures negative to date.Antibiotics now tapered down to Levaquin-last dose on 10/28.  Elevated troponin:Echo without WMA. EF normal. Cardiology signed off-no further recommendations.. On ASA, beta blocker  Hypothyroidism: Continue Synthroid   Stage III chronic kidney disease: Creatinine close to baseline. Follow.  History of CVA: Focal exam-continue aspirin, statins.  Severe malnutrition: In the context of chronic illness, continue supplements on discharge.  Hyponatremia: Appears to be chronic-suspected SIADH at baseline. Supportive care, monitor electrolytes in the outpatient setting. May need to limit fluids to 1500 mL a day. Continue to monitor I's closely.  Depression: Continue Zoloft  Dysphagia: On dysphagia 1 diet prior to this admission due to GI issues. Given deconditioning and generalized weakness-remains at risk of aspiration. Spoke with daughter Maryjean Ka over the phone on 10/26-she is aware of aspiration risks-and is agreeable to continue with dysphagia 1 diet.  TODAY-DAY OF DISCHARGE:  Subjective:   Shannon [name] today has no headache,no chest abdominal pain,no new weakness tingling or numbness, feels much better wants to go  home today.   Objective:   Blood pressure 115/52, pulse 55, temperature 98.2 F (36.8 C), temperature source Oral, resp. rate 18, height 5\' 6"  (1.676  m), weight 61.9 kg (136 lb 7.4 oz), SpO2 100 %.  Intake/Output Summary (Last 24 hours) at 01/27/15 0955 Last data filed at 01/26/15 1705  Gross per 24 hour  Intake    240 ml  Output   1700 ml  Net  -1460 ml   Filed Weights   01/12/15 1123 01/24/15 0306  Weight: 57.97 kg (127 lb 12.8 oz) 61.9 kg (136 lb 7.4 oz)    Exam Awake Alert, Oriented *3, No new F.N deficits, Normal affect [place].AT,PERRAL Supple Neck,No JVD, No cervical lymphadenopathy appriciated.  Symmetrical Chest wall movement, Good air movement bilaterally, CTAB RRR,No Gallops,Rubs or new Murmurs, No Parasternal Heave +ve B.Sounds, Abd Soft, Non tender, No organomegaly appriciated, No rebound -guarding or rigidity. No Cyanosis, Clubbing or edema, No new Rash or bruise  DISCHARGE CONDITION: Stable  DISPOSITION: Home with home health services  DISCHARGE INSTRUCTIONS:    Activity:  As tolerated with Full fall precautions use walker/cane & assistance as needed  Get Medicines reviewed and adjusted: Please take all your medications with you for your next visit with your Primary MD  Please request your Primary MD to go over all hospital tests and procedure/radiological results at the follow up, please ask your Primary MD to get all Hospital records sent to his/her office.  If you experience worsening of your admission symptoms, develop shortness of breath, life threatening emergency, suicidal or homicidal thoughts you must seek medical attention immediately by calling 911 or calling your MD immediately  if symptoms less severe.  You must read complete instructions/literature along with all the possible adverse reactions/side effects for all the Medicines you take and that have been prescribed to you. Take any new Medicines after you have completely understood and accpet all the possible adverse reactions/side effects.   Do not drive when taking Pain medications.   Do not take more than prescribed Pain, Sleep and Anxiety  Medications  Special Instructions: If you have smoked or chewed Tobacco  in the last 2 yrs please stop smoking, stop any regular Alcohol  and or any Recreational drug use.  Wear Seat belts while driving.  Please note  You were cared for by a hospitalist during your hospital stay. Once you are discharged, your primary care physician will handle any further medical issues. Please note that NO REFILLS for any discharge medications will be authorized once you are discharged, as it is imperative that you return to your primary care physician (or establish a relationship with a primary care physician if you do not have one) for your aftercare needs so that they can reassess your need for medications and monitor your lab values.  Diet recommendation: Diet recommendations: Dysphagia 1 (puree);Thin liquid Liquids provided via: Cup Medication Administration: Whole meds with puree Supervision: Patient able to self feed;Intermittent supervision to cue for compensatory strategies Compensations: Slow rate;Small sips/bites Postural Changes and/or Swallow Maneuvers: Seated upright 90 degrees;Upright 30-60 min after meal Fluid restriction 1.5 lit/day   Discharge Instructions    Diet - low sodium heart healthy    Complete by:  As directed   Diet recommendations: Dysphagia 1 (puree);Thin liquid Liquids provided via: Cup;Straw Medication Administration: Whole meds with puree Supervision: Patient able to self feed;Intermittent supervision to cue for compensatory strategies Compensations: Slow rate;Small sips/bites Postural Changes and/or Swallow Maneuvers: Seated upright 90 degrees;Upright 30-60 min  after meal     Increase activity slowly    Complete by:  As directed            Follow-up Information    Follow up with Shelly Rubenstein, MD.   Specialty:  General Surgery   Why:  our office is arranging this   Contact information:   [address] [place] [address] [phone]        Follow up with Kirt Boys, DO. Schedule an appointment as soon as possible for a visit in 2 weeks.   Specialty:  Internal Medicine   Contact information:   [address] [place] [address] [phone]       Total Time spent on discharge equals  45 minutes.  SignedJeoffrey Massed 01/27/2015 9:55 AM

## 2015-01-27 NOTE — Progress Notes (Signed)
Occupational Therapy Treatment Patient Details Name: Shannon [name] MRN: [id number] DOB: January 29, 1924 Today's Date: 01/27/2015    History of present illness 81 y.o. female admitted with SBO, now s/p Exploratory Laparotomy.  Pt with h/o gastric outlet obstruction, SBO, sequela of a perforated pyloric / duodenal ulcer and SIADH (syndrome of inappropriate ADH production)    OT comments  Pt progressing towards acute OT goals. Focus of session was toilet/shower transfers, UE strengthening, and energy conservation and fall prevention education. Session details below. D/c plan remains appropriate.   Follow Up Recommendations  Home health OT;Supervision/Assistance - 24 hour    Equipment Recommendations  3 in 1 bedside comode    Recommendations for Other Services      Precautions / Restrictions Precautions Precautions: Fall Restrictions Weight Bearing Restrictions: No       Mobility Bed Mobility               General bed mobility comments: pt up in chair  Transfers Overall transfer level: Needs assistance Equipment used: Rolling walker (2 wheeled) Transfers: Sit to/from Stand Sit to Stand: Min guard         General transfer comment: close min guard but no physical assit. cues for tehcnique with rw.    Balance Overall balance assessment: Needs assistance Sitting-balance support: No upper extremity supported;Feet supported Sitting balance-Leahy Scale: Fair     Standing balance support: Bilateral upper extremity supported;During functional activity Standing balance-Leahy Scale: Poor Standing balance comment: assist for pericare in sit<>stand. could stand to wash hands at sink. Pt adjusted robe in standing breifly letting go of rw with no LOB. Still overall needs external support for balance.                    ADL Overall ADL's : Needs assistance/impaired     Grooming: Wash/dry hands;Min Production designer, theatre/television/film: Min  guard;Ambulation;RW;BSC Toilet Transfer Details (indicate cue type and reason): close min guard for safety, cues for technique with rw Toileting- Clothing Manipulation and Hygiene: Moderate assistance;Sit to/from stand Toileting - Clothing Manipulation Details (indicate cue type and reason): Pt stood with BUE on rw while therapist provided pericare/clothing management. Discussed having daughter assist with toileting at home. Educated on 3n1 over toilet at home.    Tub/Shower Transfer Details (indicate cue type and reason): Educated on tub transfer technique with daughter assisting. Pt reports she has a shower seat. Functional mobility during ADLs: Min guard;Rolling walker General ADL Comments: Pt very motivated to participate in therapy and asking to walk in the hallway. Educated on fall prevention, home setup, toilet/tub shower transfer technique, and energy conservation. Pt reports theraband is at home. Reviewed UE strengthening program.       Vision                     Perception     Praxis      Cognition   Behavior During Therapy: [facility] for tasks assessed/performed Overall Cognitive Status: Within Functional Limits for tasks assessed                       Extremity/Trunk Assessment               Exercises Other Exercises Other Exercises: Reviewed UE exercises. Pt reports theraband is at home.    Shoulder Instructions       General Comments  Pertinent Vitals/ Pain       Pain Assessment: 0-10 Pain Score: 3  Pain Location: abdomen "gas pain" Pain Descriptors / Indicators:  ("gas pain") Pain Intervention(s): Monitored during session;Repositioned  Home Living                                          Prior Functioning/Environment              Frequency Min 2X/week     Progress Toward Goals  OT Goals(current goals can now be found in the care plan section)  Progress towards OT goals: Progressing toward goals  Acute  Rehab OT Goals Patient Stated Goal: none stated OT Goal Formulation: With patient Time For Goal Achievement: 02/03/15 Potential to Achieve Goals: Fair ADL Goals Pt Will Perform Grooming: with modified independence;standing Pt Will Perform Lower Body Bathing: with supervision;sitting/lateral leans;sit to/from stand Pt Will Perform Lower Body Dressing: with supervision;sitting/lateral leans;sit to/from stand Pt Will Transfer to Toilet: with supervision;ambulating;bedside commode Pt Will Perform Toileting - Clothing Manipulation and hygiene: with supervision;sitting/lateral leans;sit to/from stand Pt Will Perform Tub/Shower Transfer: with min guard assist;ambulating;rolling walker;shower seat Pt/caregiver will Perform Home Exercise Program: Increased strength;Both right and left upper extremity;With theraband;With Supervision  Plan Discharge plan remains appropriate    Co-evaluation                 End of Session Equipment Utilized During Treatment: Rolling walker   Activity Tolerance Patient tolerated treatment well   Patient Left in chair;with call bell/phone within reach;with chair alarm set   Nurse Communication          Time: 6440-3474 OT Time Calculation (min): 27 min  Charges: OT General Charges $OT Visit: 1 Procedure OT Treatments $Self Care/Home Management : 23-37 mins  Pilar Grammes 01/27/2015, 12:08 PM

## 2015-01-27 NOTE — Progress Notes (Signed)
Physical Therapy Treatment Patient Details Name: Shannon [name] MRN: [id number] DOB: [birth year] Today's Date: 01/27/2015    History of Present Illness 81 y.o. female admitted with SBO, now s/p Exploratory Laparotomy.  Pt with h/o gastric outlet obstruction, SBO, sequela of a perforated pyloric / duodenal ulcer and SIADH (syndrome of inappropriate ADH production)     PT Comments    Pt with much improved mobility, ambulation, and activity tolerance. Pt functioning at min guard/supervision level. Pt's daughter able to provide 24/7 assist. Pt safe to d/c home with con't use of RW and 24/7 assist once medically stable.   Follow Up Recommendations  Home health PT;Supervision/Assistance - 24 hour     Equipment Recommendations  None recommended by PT    Recommendations for Other Services       Precautions / Restrictions Precautions Precautions: Fall Restrictions Weight Bearing Restrictions: No    Mobility  Bed Mobility               General bed mobility comments: pt up in chair  Transfers Overall transfer level: Needs assistance Equipment used: Rolling walker (2 wheeled) Transfers: Sit to/from Stand Sit to Stand: Min guard         General transfer comment: pt with good technique and hand placement  Ambulation/Gait Ambulation/Gait assistance: Min guard Ambulation Distance (Feet): 165 Feet Assistive device: Rolling walker (2 wheeled) Gait Pattern/deviations: Step-through pattern Gait velocity: slow Gait velocity interpretation: at or above normal speed for age/gender General Gait Details: pt with increased trunk flexion but suspect this is baseline. no over episodes of LOB, pt with good stability during turning as well.   Stairs            Wheelchair Mobility    Modified Rankin (Stroke Patients Only)       Balance Overall balance assessment: Needs assistance Sitting-balance support: No upper extremity supported;Feet supported Sitting balance-Leahy  Scale: Fair     Standing balance support: Bilateral upper extremity supported Standing balance-Leahy Scale: Poor Standing balance comment: needs RW for amb                    Cognition Arousal/Alertness: Awake/alert Behavior During Therapy: WFL for tasks assessed/performed Overall Cognitive Status: Within Functional Limits for tasks assessed                      Exercises Other Exercises Other Exercises: Reviewed UE exercises. Pt reports theraband is at home.     General Comments        Pertinent Vitals/Pain Pain Assessment: 0-10 Pain Score: 3  Pain Location: gas pains Pain Descriptors / Indicators: Moaning Pain Intervention(s): Monitored during session;Repositioned    Home Living                      Prior Function            PT Goals (current goals can now be found in the care plan section) Acute Rehab PT Goals Patient Stated Goal: none stated Progress towards PT goals: Progressing toward goals    Frequency  Min 3X/week    PT Plan Current plan remains appropriate;Discharge plan needs to be updated    Co-evaluation             End of Session Equipment Utilized During Treatment: Gait belt Activity Tolerance: Patient tolerated treatment well Patient left: in chair;with call bell/phone within reach     Time: 1257-1312 PT Time Calculation (min) (ACUTE ONLY): 15 min  Charges:  $Gait Training: 8-22 mins                    G Codes:      Marcene Brawn 01/27/2015, 2:47 PM   Lewis Shock, PT, DPT Pager #: [phone] Office #: [phone]

## 2015-01-27 NOTE — Progress Notes (Signed)
Discharge orders received, Pt for discharge home today with home health care. IV d/c'd. D/c instructions and RX given with verbalized understanding. Family at bedside to assist patient with discharge. Staff bought pt downstairs via wheelchair. 01/27/15 1828

## 2015-01-27 NOTE — Discharge Instructions (Signed)
[facility]      [facility] [phone]  OPEN ABDOMINAL SURGERY: POST OP INSTRUCTIONS  Always review your discharge instruction sheet given to you by the facility where your surgery was performed.  IF YOU HAVE DISABILITY OR FAMILY LEAVE FORMS, YOU MUST BRING THEM TO THE OFFICE FOR PROCESSING.  PLEASE DO NOT GIVE THEM TO YOUR DOCTOR.  1. A prescription for pain medication may be given to you upon discharge.  Take your pain medication as prescribed, if needed.  If narcotic pain medicine is not needed, then you may take acetaminophen (Tylenol) or ibuprofen (Advil) as needed. 2. Take your usually prescribed medications unless otherwise directed. 3. If you need a refill on your pain medication, please contact your pharmacy. They will contact our office to request authorization.  Prescriptions will not be filled after 5pm or on week-ends. 4. You should follow a light diet the first few days after arrival home, such as soup and crackers, pudding, etc.unless your doctor has advised otherwise. A high-fiber, low fat diet can be resumed as tolerated.   Be sure to include lots of fluids daily. Most patients will experience some swelling and bruising on the chest and neck area.  Ice packs will help.  Swelling and bruising can take several days to resolve 5. Most patients will experience some swelling and bruising in the area of the incision. Ice pack will help. Swelling and bruising can take several days to resolve..  6. It is common to experience some constipation if taking pain medication after surgery.  Increasing fluid intake and taking a stool softener will usually help or prevent this problem from occurring.  A mild laxative (Milk of Magnesia or Miralax) should be taken according to package directions if there are no bowel movements after 48 hours. 7.  You may have steri-strips (small skin tapes) in place directly over the incision.  These strips should be left on the skin for 7-10 days.  If your  surgeon used skin glue on the incision, you may shower in 24 hours.  The glue will flake off over the next 2-3 weeks.  Any sutures or staples will be removed at the office during your follow-up visit. You may find that a light gauze bandage over your incision may keep your staples from being rubbed or pulled. You may shower and replace the bandage daily. 8. ACTIVITIES:  You may resume regular (light) daily activities beginning the next day--such as daily self-care, walking, climbing stairs--gradually increasing activities as tolerated.  You may have sexual intercourse when it is comfortable.  Refrain from any heavy lifting or straining until approved by your doctor. a. You may drive when you no longer are taking prescription pain medication, you can comfortably wear a seatbelt, and you can safely maneuver your car and apply brakes b. Return to Work: ___________________________________ 9. You should see your doctor in the office for a follow-up appointment approximately two weeks after your surgery.  Make sure that you call for this appointment within a day or two after you arrive home to insure a convenient appointment time. OTHER INSTRUCTIONS:  _____________________________________________________________ _____________________________________________________________  WHEN TO CALL YOUR DOCTOR: 1. Fever over 101.0 2. Inability to urinate 3. Nausea and/or vomiting 4. Extreme swelling or bruising 5. Continued bleeding from incision. 6. Increased pain, redness, or drainage from the incision. 7. Difficulty swallowing or breathing 8. Muscle cramping or spasms. 9. Numbness or tingling in hands or feet or around lips.  The clinic staff is available to   answer your questions during regular business hours.  Please don't hesitate to call and ask to speak to one of the nurses if you have concerns.  For further questions, please visit [email or web address]   

## 2015-01-27 NOTE — ED Notes (Signed)
Per [facility], pt was discharged from here this morning for abd surgery and went home. Had tried to have a BM and had a syncopal episode with brief moment of unresponsiveness afterwards. Lives with daughter. Upon EMS arrival pt was alert and oriented, tachy and tachypneic then became brady. Daughter reported that she had episodes like that in the hospital where her heart rate was high and then would go low. 18g to RAC.

## 2015-01-27 NOTE — ED Notes (Signed)
CBG 112.  

## 2015-01-27 NOTE — ED Provider Notes (Signed)
CSN: [id number]     Arrival date & time 01/27/15  2039 History   First MD Initiated Contact with Patient 01/27/15 2046     Chief Complaint  Patient presents with  . Loss of Consciousness     (Consider location/radiation/quality/duration/timing/severity/associated sxs/prior Treatment) HPI Comments: 81yo F w/ PMH including HTN, HLD, CVA, seizure d/o, SBO s/p recent surgery who p/w syncope. Patient was discharged from the hospitalist morning after a prolonged hospitalization for small bowel obstruction surgery. This evening, the patient was sitting in her wheelchair at home and began feeling lightheaded. Her daughter reports that she had a several second episode of loss of consciousness. No seizure-like activity. After she woke up she felt like she needed to have a bowel movement and daughter took her to the bathroom, where she fell. Patient states that she did not lose consciousness this time but family member called EMS. On arrival, EMS noted that the patient was awake and oriented and mildly tachycardic. Patient states that she has been having bowel movements but has not had a bowel movement today. She denies any worsening abdominal pain. No vomiting or urinary symptoms. No chest pain or shortness of breath.  Patient is a 81 y.o. female presenting with syncope. The history is provided by the patient.  Loss of Consciousness   Past Medical History  Diagnosis Date  . Pneumonia   . Hyperlipidemia   . Hypertension   . Small bowel obstruction ([place])     "I don't know how many" (01/11/2015)  . Ventral hernia with bowel obstruction   . Asthma   . Hyperlipidemia   . Stroke ([place])     "light one"  . COPD (chronic obstructive pulmonary disease) ([place])   . Hypothyroidism   . Type II diabetes mellitus ([place])   . Anemia   . History of blood transfusion   . GERD (gastroesophageal reflux disease)   . History of stomach ulcers   . Arthritis     "all over"  . Renal insufficiency   . Depression      "light case"  . SIADH (syndrome of inappropriate ADH production) ([place])     [name] 01/10/2015   Past Surgical History  Procedure Laterality Date  . Tubal ligation    . Colectomy    . Hernia repair  2015  . Esophagogastroduodenoscopy N/A 01/19/2014    Procedure: ESOPHAGOGASTRODUODENOSCOPY (EGD);  Surgeon: Irene Shipper, MD;  Location: [facility];  Service: Endoscopy;  Laterality: N/A;  . Esophagogastroduodenoscopy N/A 01/20/2014    Procedure: ESOPHAGOGASTRODUODENOSCOPY (EGD);  Surgeon: Irene Shipper, MD;  Location: [facility];  Service: Endoscopy;  Laterality: N/A;  . Esophagogastroduodenoscopy N/A 03/19/2014    Procedure: ESOPHAGOGASTRODUODENOSCOPY (EGD);  Surgeon: Milus Banister, MD;  Location: [facility];  Service: Endoscopy;  Laterality: N/A;  . Cholecystectomy open    . Ventral hernia repair  2015    incarcerated ventral hernia ([facility] 09/2013)/notes 01/10/2015  . Cataract extraction w/ intraocular lens  implant, bilateral    . Gastrojejunostomy      hx/notes 01/10/2015  . Laparotomy N/A 01/20/2015    Procedure: EXPLORATORY LAPAROTOMY;  Surgeon: Coralie Keens, MD;  Location: [facility];  Service: General;  Laterality: N/A;  . Lysis of adhesion N/A 01/20/2015    Procedure: LYSIS OF ADHESIONS < 1 HOUR;  Surgeon: Coralie Keens, MD;  Location: [facility];  Service: General;  Laterality: N/A;   Family History  Problem Relation Age of Onset  . Stroke Mother   . Heart attack Neg Hx   .  Hypertension Mother   . Diabetes Brother    Social History  Substance Use Topics  . Smoking status: Never Smoker   . Smokeless tobacco: Former Systems developer    Types: Snuff  . Alcohol Use: No   OB History    No data available     Review of Systems  Cardiovascular: Positive for syncope.    10 Systems reviewed and are negative for acute change except as noted in the HPI.   Allergies  Review of patient's allergies indicates no known allergies.  Home Medications   Prior to Admission medications    Medication Sig Start Date End Date Taking? Authorizing Provider  acetaminophen (TYLENOL) 325 MG tablet Take 650 mg by mouth every 6 (six) hours as needed for mild pain or fever.   Yes Historical Provider, MD  ADVAIR DISKUS 250-50 MCG/DOSE AEPB INHALE 1 PUFF INTO THE LUNGS 2 (TWO) TIMES DAILY AS NEEDED FOR SHORTNESS OF BREATH 12/01/14  Yes Historical Provider, MD  albuterol (PROVENTIL HFA;VENTOLIN HFA) 108 (90 BASE) MCG/ACT inhaler Inhale 2 puffs into the lungs every 6 (six) hours as needed for wheezing or shortness of breath. 04/08/14  Yes Dorothy Spark, MD  Amino Acids-Protein Hydrolys (FEEDING SUPPLEMENT, PRO-STAT SUGAR FREE 64,) LIQD Take 30 mLs by mouth 3 (three) times daily. 01/27/15  Yes Shanker Kristeen Mans, MD  amLODipine (NORVASC) 10 MG tablet Take 1 tablet (10 mg total) by mouth daily. For HTN 11/04/14  Yes Gildardo Cranker, DO  aspirin EC 81 MG tablet Take 81 mg by mouth every morning.   Yes Historical Provider, MD  atorvastatin (LIPITOR) 10 MG tablet TAKE 1 TABLET BY MOUTH EVERY DAY 09/11/14  Yes Gildardo Cranker, DO  b complex vitamins tablet Take 1 tablet by mouth 2 (two) times daily.   Yes Historical Provider, MD  calcium-vitamin D (OSCAL WITH D) 500-200 MG-UNIT per tablet Take 1 tablet by mouth daily with breakfast.    Yes Historical Provider, MD  chlorhexidine (PERIDEX) 0.12 % solution 15 mLs by Mouth Rinse route 2 (two) times daily. 03/26/14  Yes Robbie Lis, MD  cloNIDine (CATAPRES) 0.1 MG tablet Take 1 tablet (0.1 mg total) by mouth 2 (two) times daily. 07/13/14  Yes Dorothy Spark, MD  feeding supplement (BOOST / RESOURCE BREEZE) LIQD Take 1 Container by mouth 3 (three) times daily between meals. 01/27/15  Yes Shanker Kristeen Mans, MD  fluticasone (FLONASE) 50 MCG/ACT nasal spray Place 2 sprays into both nostrils daily. 08/07/14  Yes Gildardo Cranker, DO  gabapentin (NEURONTIN) 300 MG capsule Take 1 capsule (300 mg total) by mouth 3 (three) times daily. 09/23/14  Yes Gildardo Cranker, DO   isosorbide mononitrate (IMDUR) 30 MG 24 hr tablet Take one tablet by mouth once daily. If Chest Pains occur increase to two tablets daily. Patient taking differently: Take 30-60 mg by mouth daily. Take one tablet by mouth once daily. If Chest Pains occur increase to two tablets daily. 07/22/14  Yes Monica Carter, DO  lactulose (CHRONULAC) 10 GM/15ML solution Take 15 mLs (10 g total) by mouth daily as needed for mild constipation. 06/02/14  Yes Mahima Pandey, MD  latanoprost (XALATAN) 0.005 % ophthalmic solution Place 1 drop into both eyes at bedtime. 06/02/14  Yes Mahima Bubba Camp, MD  levofloxacin (LEVAQUIN) 500 MG tablet Take 1 tablet (500 mg total) by mouth every other day. 01/28/15 01/29/15 Yes Shanker Kristeen Mans, MD  levothyroxine (SYNTHROID, LEVOTHROID) 100 MCG tablet Take 1 tablet (100 mcg total) by mouth daily. Take with  128mcg dose (total 257mcg) 11/06/14  Yes Gildardo Cranker, DO  levothyroxine (SYNTHROID, LEVOTHROID) 125 MCG tablet Take 1 tablet (125 mcg total) by mouth daily before breakfast. Take with 132mcg dose (total 275mcg) 11/06/14  Yes Gildardo Cranker, DO  metoprolol succinate (TOPROL-XL) 25 MG 24 hr tablet Take 1 tablet (25 mg total) by mouth daily. 09/23/14  Yes Gildardo Cranker, DO  Multiple Vitamins-Minerals (MULTIVITAMIN WITH MINERALS) tablet Take 1 tablet by mouth every morning.    Yes Historical Provider, MD  pantoprazole (PROTONIX) 40 MG tablet TAKE 1 TABLET BY MOUTH TWICE A DAY 08/07/14  Yes Gildardo Cranker, DO  Polyethyl Glycol-Propyl Glycol 0.4-0.3 % SOLN Place 1 drop into both eyes 4 (four) times daily.    Yes Historical Provider, MD  polyethylene glycol (MIRALAX / GLYCOLAX) packet Take 17 g by mouth 2 (two) times daily.   Yes Historical Provider, MD  RESTASIS 0.05 % ophthalmic emulsion USE 1 DROP INTO BOTH EYES TWICE DAILY 10/19/14  Yes Gildardo Cranker, DO  sennosides-docusate sodium (SENOKOT-S) 8.6-50 MG tablet Take 1 tablet by mouth 2 (two) times daily.    Yes Historical Provider, MD  sertraline  (ZOLOFT) 100 MG tablet Take one tablet by mouth once daily for depression 11/16/14  Yes Gildardo Cranker, DO  simethicone (MYLICON) 80 MG chewable tablet Chew 1 tablet (80 mg total) by mouth 4 (four) times daily as needed for flatulence. 01/27/15  Yes Shanker Kristeen Mans, MD  sodium chloride 1 G tablet TAKE 1 TABLET BY MOUTH THREE TIMES DAILY 11/04/14  Yes Gildardo Cranker, DO  traMADol (ULTRAM) 50 MG tablet Take by mouth 2 (two) times daily as needed for moderate pain.   Yes Historical Provider, MD   BP 144/48 mmHg  Pulse 114  Temp(Src) 97.4 F (36.3 C) (Oral)  Resp 22  SpO2 99% Physical Exam  Constitutional: She is oriented to person, place, and time. No distress.  Frail, elderly woman awake, alert  HENT:  Head: Normocephalic and atraumatic.  dry mucous membranes  Eyes: Conjunctivae are normal. Pupils are equal, round, and reactive to light.  Neck: Neck supple.  Cardiovascular: Normal rate, regular rhythm and normal heart sounds.   No murmur heard. Pulmonary/Chest: Effort normal and breath sounds normal.  Abdominal: Soft. Bowel sounds are normal. She exhibits no distension.  Midline staples in place clean/dry/intact, mild tenderness along surgical incision only  Musculoskeletal: She exhibits no edema.  Neurological: She is alert and oriented to person, place, and time.  Fluent speech  Skin: Skin is warm and dry.  Psychiatric: She has a normal mood and affect. Judgment normal.  Nursing note and vitals reviewed.   ED Course  Procedures (including critical care time) Labs Review Labs Reviewed  BASIC METABOLIC PANEL - Abnormal; Notable for the following:    Sodium 126 (*)    Chloride 96 (*)    CO2 20 (*)    Glucose, Bld 119 (*)    BUN 49 (*)    Creatinine, Ser 1.16 (*)    Calcium 8.5 (*)    GFR calc non Af Amer 40 (*)    GFR calc Af Amer 47 (*)    All other components within normal limits  CBC - Abnormal; Notable for the following:    RBC 2.81 (*)    Hemoglobin 8.8 (*)    HCT  25.8 (*)    All other components within normal limits  HEPATIC FUNCTION PANEL - Abnormal; Notable for the following:    Total Protein 6.3 (*)  Albumin 2.6 (*)    AST 46 (*)    Total Bilirubin 0.2 (*)    Indirect Bilirubin 0.0 (*)    All other components within normal limits  CBG MONITORING, ED - Abnormal; Notable for the following:    Glucose-Capillary 112 (*)    All other components within normal limits  URINALYSIS, ROUTINE W REFLEX MICROSCOPIC (NOT AT [facility])    Imaging Review Dg Chest 2 View  01/27/2015  CLINICAL DATA:  Syncope EXAM: CHEST  2 VIEW COMPARISON:  January 22, 2015 FINDINGS: There is elevation of the right hemidiaphragm, stable. There is no edema or consolidation. The heart size and pulmonary vascularity are normal. There is no appreciable adenopathy. There is atherosclerotic change in the aorta. There is degenerative change in both shoulders. Bones are osteoporotic with multiple compression fractures in the thoracic region. IMPRESSION: No edema or consolidation. Electronically Signed   By: Lowella Grip III M.D.   On: 01/27/2015 21:59   Dg Abd 1 View  01/27/2015  CLINICAL DATA:  Acute onset of lower abdominal pain, status post surgery for small bowel obstruction. Syncope. Initial encounter. EXAM: ABDOMEN - 1 VIEW COMPARISON:  Abdominal radiograph performed 01/19/2015 FINDINGS: Skin staples are noted along the midline abdomen and pelvis. Clips are noted within the right upper quadrant, reflecting prior cholecystectomy. The visualized bowel gas pattern is unremarkable. No small or large bowel dilatation is seen to suggest obstruction. A moderate amount of stool is noted in the colon. No free intra-abdominal air is identified, though evaluation for free air is limited on a single supine view. No acute osseous abnormalities are seen. Scattered vascular calcifications are noted. IMPRESSION: Unremarkable bowel gas pattern; no free intra-abdominal air seen. Moderate amount of stool  noted in the colon. Electronically Signed   By: Garald Balding M.D.   On: 01/27/2015 22:04   I have personally reviewed and evaluated these lab results as part of my medical decision-making.   EKG Interpretation   Date/Time:  Wednesday January 27 2015 20:50:05 EDT Ventricular Rate:  61 PR Interval:  153 QRS Duration: 74 QT Interval:  410 QTC Calculation: 413 R Axis:   72 Text Interpretation:  Sinus rhythm Multiple premature complexes, vent &  supraven Probable left atrial enlargement rate slower than previous,  otherwise no significant changes Confirmed by LITTLE MD, RACHEL [phone]) on  01/27/2015 9:54:33 PM     Medications  polyethylene glycol (MIRALAX / GLYCOLAX) packet 17 g (not administered)  sodium chloride 0.9 % bolus 1,000 mL (1,000 mLs Intravenous New Bag/Given 01/27/15 2231)    MDM   Final diagnoses:  Syncope and collapse  Dehydration  acute kidney injury Hyponatremia  81 year old female who presents with syncopal episode that occurred at home after she was discharged from the hospital today. On arrival by EMS, the patient was awake, alert, oriented and in no acute distress. Vital signs unremarkable. EKG without any ischemic changes or significant change from previous. No abdominal distention or significant abdominal pain to suggest postoperative complication. Patient did appear dry on exam. Obtained above labs as well as chest x-ray and KUB given recent hospitalization. Gave pt 1L IVF bolus.  KUB with moderate amount of stool but nonobstructive. Chest x-ray unremarkable. Labs suggestive of dehydration with sodium 126, chloride 96, react pain mildly elevated at 1.16. Stable postoperative anemia at hemoglobin 8.8. Given the patient's syncopal episode while seated as well as her evidence of dehydration, admitted patient to general medicine for further care.   Sharlett Iles, MD  01/28/15 0005 

## 2015-01-27 NOTE — Clinical Social Work Note (Signed)
CSW received referral for SNF.  Case discussed with case manager, and plan is to discharge home.  CSW to sign off please re-consult if social work needs arise.  Allye Hoyos R. Ermon Sagan, MSW, LCSWA [phone]  

## 2015-01-28 ENCOUNTER — Encounter (HOSPITAL_COMMUNITY): Payer: Self-pay

## 2015-01-28 DIAGNOSIS — D649 Anemia, unspecified: Secondary | ICD-10-CM | POA: Diagnosis present

## 2015-01-28 DIAGNOSIS — K219 Gastro-esophageal reflux disease without esophagitis: Secondary | ICD-10-CM | POA: Diagnosis present

## 2015-01-28 DIAGNOSIS — J449 Chronic obstructive pulmonary disease, unspecified: Secondary | ICD-10-CM | POA: Diagnosis present

## 2015-01-28 DIAGNOSIS — Z9889 Other specified postprocedural states: Secondary | ICD-10-CM

## 2015-01-28 DIAGNOSIS — Z8673 Personal history of transient ischemic attack (TIA), and cerebral infarction without residual deficits: Secondary | ICD-10-CM | POA: Diagnosis not present

## 2015-01-28 DIAGNOSIS — E440 Moderate protein-calorie malnutrition: Secondary | ICD-10-CM | POA: Diagnosis present

## 2015-01-28 DIAGNOSIS — Z66 Do not resuscitate: Secondary | ICD-10-CM | POA: Diagnosis present

## 2015-01-28 DIAGNOSIS — Z6820 Body mass index (BMI) 20.0-20.9, adult: Secondary | ICD-10-CM | POA: Diagnosis not present

## 2015-01-28 DIAGNOSIS — E1151 Type 2 diabetes mellitus with diabetic peripheral angiopathy without gangrene: Secondary | ICD-10-CM | POA: Diagnosis not present

## 2015-01-28 DIAGNOSIS — E871 Hypo-osmolality and hyponatremia: Secondary | ICD-10-CM

## 2015-01-28 DIAGNOSIS — Z7982 Long term (current) use of aspirin: Secondary | ICD-10-CM | POA: Diagnosis not present

## 2015-01-28 DIAGNOSIS — F329 Major depressive disorder, single episode, unspecified: Secondary | ICD-10-CM | POA: Diagnosis present

## 2015-01-28 DIAGNOSIS — J45909 Unspecified asthma, uncomplicated: Secondary | ICD-10-CM | POA: Diagnosis present

## 2015-01-28 DIAGNOSIS — E222 Syndrome of inappropriate secretion of antidiuretic hormone: Secondary | ICD-10-CM | POA: Diagnosis present

## 2015-01-28 DIAGNOSIS — Z87891 Personal history of nicotine dependence: Secondary | ICD-10-CM | POA: Diagnosis not present

## 2015-01-28 DIAGNOSIS — I10 Essential (primary) hypertension: Secondary | ICD-10-CM | POA: Diagnosis not present

## 2015-01-28 DIAGNOSIS — E860 Dehydration: Secondary | ICD-10-CM | POA: Diagnosis present

## 2015-01-28 DIAGNOSIS — E1140 Type 2 diabetes mellitus with diabetic neuropathy, unspecified: Secondary | ICD-10-CM | POA: Diagnosis present

## 2015-01-28 DIAGNOSIS — Z9049 Acquired absence of other specified parts of digestive tract: Secondary | ICD-10-CM | POA: Diagnosis not present

## 2015-01-28 DIAGNOSIS — E785 Hyperlipidemia, unspecified: Secondary | ICD-10-CM | POA: Diagnosis present

## 2015-01-28 DIAGNOSIS — E039 Hypothyroidism, unspecified: Secondary | ICD-10-CM | POA: Diagnosis present

## 2015-01-28 DIAGNOSIS — E1142 Type 2 diabetes mellitus with diabetic polyneuropathy: Secondary | ICD-10-CM | POA: Diagnosis not present

## 2015-01-28 DIAGNOSIS — R55 Syncope and collapse: Secondary | ICD-10-CM | POA: Diagnosis not present

## 2015-01-28 LAB — BASIC METABOLIC PANEL
Anion gap: 8 (ref 5–15)
Anion gap: 7 (ref 5–15)
Anion gap: 8 (ref 5–15)
Anion gap: 10 (ref 5–15)
BUN: 49 mg/dL — ABNORMAL HIGH (ref 6–20)
BUN: 46 mg/dL — ABNORMAL HIGH (ref 6–20)
BUN: 43 mg/dL — ABNORMAL HIGH (ref 6–20)
BUN: 40 mg/dL — ABNORMAL HIGH (ref 6–20)
CO2: 21 mmol/L — ABNORMAL LOW (ref 22–32)
CO2: 21 mmol/L — ABNORMAL LOW (ref 22–32)
CO2: 21 mmol/L — ABNORMAL LOW (ref 22–32)
CO2: 20 mmol/L — ABNORMAL LOW (ref 22–32)
Calcium: 8.4 mg/dL — ABNORMAL LOW (ref 8.9–10.3)
Calcium: 8.3 mg/dL — ABNORMAL LOW (ref 8.9–10.3)
Calcium: 8.5 mg/dL — ABNORMAL LOW (ref 8.9–10.3)
Calcium: 9 mg/dL (ref 8.9–10.3)
Chloride: 101 mmol/L (ref 101–111)
Chloride: 102 mmol/L (ref 101–111)
Chloride: 105 mmol/L (ref 101–111)
Chloride: 103 mmol/L (ref 101–111)
Creatinine, Ser: 1.05 mg/dL — ABNORMAL HIGH (ref 0.44–1.00)
Creatinine, Ser: 1.02 mg/dL — ABNORMAL HIGH (ref 0.44–1.00)
Creatinine, Ser: 0.97 mg/dL (ref 0.44–1.00)
Creatinine, Ser: 0.91 mg/dL (ref 0.44–1.00)
GFR calc Af Amer: 53 mL/min — ABNORMAL LOW (ref >60)
GFR calc Af Amer: 55 mL/min — ABNORMAL LOW (ref >60)
GFR calc Af Amer: 58 mL/min — ABNORMAL LOW (ref >60)
GFR calc Af Amer: >60 mL/min (ref >60)
GFR calc non Af Amer: 46 mL/min — ABNORMAL LOW (ref >60)
GFR calc non Af Amer: 47 mL/min — ABNORMAL LOW (ref >60)
GFR calc non Af Amer: 50 mL/min — ABNORMAL LOW (ref >60)
GFR calc non Af Amer: 54 mL/min — ABNORMAL LOW (ref >60)
Glucose, Bld: 110 mg/dL — ABNORMAL HIGH (ref 65–99)
Glucose, Bld: 154 mg/dL — ABNORMAL HIGH (ref 65–99)
Glucose, Bld: 124 mg/dL — ABNORMAL HIGH (ref 65–99)
Glucose, Bld: 112 mg/dL — ABNORMAL HIGH (ref 65–99)
Potassium: 5 mmol/L (ref 3.5–5.1)
Potassium: 3.8 mmol/L (ref 3.5–5.1)
Potassium: 3.7 mmol/L (ref 3.5–5.1)
Potassium: 4.1 mmol/L (ref 3.5–5.1)
Sodium: 130 mmol/L — ABNORMAL LOW (ref 135–145)
Sodium: 130 mmol/L — ABNORMAL LOW (ref 135–145)
Sodium: 134 mmol/L — ABNORMAL LOW (ref 135–145)
Sodium: 133 mmol/L — ABNORMAL LOW (ref 135–145)

## 2015-01-28 LAB — CBC WITH DIFFERENTIAL/PLATELET
Basophils Absolute: 0 10*3/uL (ref 0.0–0.1)
Basophils Relative: 1 %
Eosinophils Absolute: 0.6 10*3/uL (ref 0.0–0.7)
Eosinophils Relative: 10 %
HCT: 22.6 % — ABNORMAL LOW (ref 36.0–46.0)
Hemoglobin: 7.8 g/dL — ABNORMAL LOW (ref 12.0–15.0)
Lymphocytes Relative: 13 %
Lymphs Abs: 0.8 10*3/uL (ref 0.7–4.0)
MCH: 32 pg (ref 26.0–34.0)
MCHC: 34.5 g/dL (ref 30.0–36.0)
MCV: 92.6 fL (ref 78.0–100.0)
Monocytes Absolute: 1 10*3/uL (ref 0.1–1.0)
Monocytes Relative: 16 %
Neutro Abs: 4 10*3/uL (ref 1.7–7.7)
Neutrophils Relative %: 60 %
Platelets: 286 10*3/uL (ref 150–400)
RBC: 2.44 MIL/uL — ABNORMAL LOW (ref 3.87–5.11)
RDW: 14.3 % (ref 11.5–15.5)
WBC: 6.5 10*3/uL (ref 4.0–10.5)

## 2015-01-28 LAB — GLUCOSE, CAPILLARY: Glucose-Capillary: 131 mg/dL — ABNORMAL HIGH (ref 65–99)

## 2015-01-28 LAB — PREPARE RBC (CROSSMATCH)

## 2015-01-28 LAB — MRSA PCR SCREENING: MRSA by PCR: POSITIVE — AB

## 2015-01-28 MED ORDER — SENNOSIDES-DOCUSATE SODIUM 8.6-50 MG PO TABS
1.0000 | ORAL_TABLET | Freq: Two times a day (BID) | ORAL | Status: DC
  Administered 2015-01-28: 1 via ORAL
  Filled 2015-01-28 (×3): qty 1

## 2015-01-28 MED ORDER — LEVOTHYROXINE SODIUM 50 MCG PO TABS
225.0000 ug | ORAL_TABLET | Freq: Every day | ORAL | Status: DC
  Administered 2015-01-28 – 2015-01-29 (×2): 225 ug via ORAL
  Filled 2015-01-28 (×4): qty 1

## 2015-01-28 MED ORDER — BOOST / RESOURCE BREEZE PO LIQD
1.0000 | Freq: Three times a day (TID) | ORAL | Status: DC
  Administered 2015-01-28 – 2015-01-29 (×4): 1 via ORAL

## 2015-01-28 MED ORDER — CHLORHEXIDINE GLUCONATE CLOTH 2 % EX PADS
6.0000 | MEDICATED_PAD | Freq: Every day | CUTANEOUS | Status: DC
  Administered 2015-01-29: 6 via TOPICAL

## 2015-01-28 MED ORDER — CLONIDINE HCL 0.1 MG PO TABS
0.1000 mg | ORAL_TABLET | Freq: Two times a day (BID) | ORAL | Status: DC
  Administered 2015-01-28 – 2015-01-29 (×3): 0.1 mg via ORAL
  Filled 2015-01-28 (×3): qty 1

## 2015-01-28 MED ORDER — ENOXAPARIN SODIUM 30 MG/0.3ML ~~LOC~~ SOLN
30.0000 mg | SUBCUTANEOUS | Status: DC
  Administered 2015-01-28: 30 mg via SUBCUTANEOUS
  Filled 2015-01-28: qty 0.3

## 2015-01-28 MED ORDER — ATORVASTATIN CALCIUM 10 MG PO TABS
10.0000 mg | ORAL_TABLET | Freq: Every day | ORAL | Status: DC
Start: 2015-01-28 — End: 2015-01-29
  Administered 2015-01-28 – 2015-01-29 (×2): 10 mg via ORAL
  Filled 2015-01-28 (×2): qty 1

## 2015-01-28 MED ORDER — MAGNESIUM CITRATE PO SOLN
1.0000 | Freq: Once | ORAL | Status: AC
  Administered 2015-01-28: 1 via ORAL
  Filled 2015-01-28: qty 296

## 2015-01-28 MED ORDER — LEVOFLOXACIN 500 MG PO TABS
500.0000 mg | ORAL_TABLET | ORAL | Status: DC
  Administered 2015-01-28: 500 mg via ORAL
  Filled 2015-01-28: qty 1

## 2015-01-28 MED ORDER — SIMETHICONE 80 MG PO CHEW
80.0000 mg | CHEWABLE_TABLET | Freq: Four times a day (QID) | ORAL | Status: DC | PRN
  Administered 2015-01-28: 80 mg via ORAL
  Filled 2015-01-28: qty 1

## 2015-01-28 MED ORDER — MUPIROCIN 2 % EX OINT
1.0000 "application " | TOPICAL_OINTMENT | Freq: Two times a day (BID) | CUTANEOUS | Status: DC
  Administered 2015-01-28 – 2015-01-29 (×3): 1 via NASAL
  Filled 2015-01-28: qty 22

## 2015-01-28 MED ORDER — ASPIRIN EC 81 MG PO TBEC
81.0000 mg | DELAYED_RELEASE_TABLET | Freq: Every morning | ORAL | Status: DC
  Administered 2015-01-28 – 2015-01-29 (×2): 81 mg via ORAL
  Filled 2015-01-28 (×2): qty 1

## 2015-01-28 MED ORDER — PRO-STAT SUGAR FREE PO LIQD
30.0000 mL | Freq: Three times a day (TID) | ORAL | Status: DC
  Administered 2015-01-28 – 2015-01-29 (×3): 30 mL via ORAL
  Filled 2015-01-28 (×3): qty 30

## 2015-01-28 MED ORDER — SODIUM CHLORIDE 0.9 % IV SOLN
INTRAVENOUS | Status: DC
Start: 2015-01-28 — End: 2015-01-29
  Administered 2015-01-28 (×2): via INTRAVENOUS

## 2015-01-28 MED ORDER — ONDANSETRON HCL 4 MG PO TABS: 4.0000 mg | ORAL_TABLET | Freq: Four times a day (QID) | ORAL | Status: DC | PRN

## 2015-01-28 MED ORDER — TRAMADOL HCL 50 MG PO TABS: 50.0000 mg | ORAL_TABLET | Freq: Two times a day (BID) | ORAL | Status: DC | PRN

## 2015-01-28 MED ORDER — BISACODYL 10 MG RE SUPP
10.0000 mg | Freq: Once | RECTAL | Status: DC
  Filled 2015-01-28: qty 1

## 2015-01-28 MED ORDER — CYCLOSPORINE 0.05 % OP EMUL
1.0000 [drp] | Freq: Two times a day (BID) | OPHTHALMIC | Status: DC
  Administered 2015-01-28 – 2015-01-29 (×3): 1 [drp] via OPHTHALMIC
  Filled 2015-01-28 (×4): qty 1

## 2015-01-28 MED ORDER — ENOXAPARIN SODIUM 40 MG/0.4ML ~~LOC~~ SOLN
40.0000 mg | SUBCUTANEOUS | Status: DC
  Administered 2015-01-29: 40 mg via SUBCUTANEOUS
  Filled 2015-01-28: qty 0.4

## 2015-01-28 MED ORDER — POTASSIUM CHLORIDE CRYS ER 20 MEQ PO TBCR
40.0000 meq | EXTENDED_RELEASE_TABLET | Freq: Once | ORAL | Status: AC
  Administered 2015-01-28: 40 meq via ORAL
  Filled 2015-01-28: qty 2

## 2015-01-28 MED ORDER — ONDANSETRON HCL 4 MG/2ML IJ SOLN: 4.0000 mg | Freq: Four times a day (QID) | INTRAMUSCULAR | Status: DC | PRN

## 2015-01-28 MED ORDER — LACTULOSE 10 GM/15ML PO SOLN
10.0000 g | Freq: Every day | ORAL | Status: DC | PRN
  Administered 2015-01-28: 10 g via ORAL
  Filled 2015-01-28: qty 15

## 2015-01-28 MED ORDER — PANTOPRAZOLE SODIUM 40 MG PO TBEC
40.0000 mg | DELAYED_RELEASE_TABLET | Freq: Two times a day (BID) | ORAL | Status: DC
  Administered 2015-01-28 – 2015-01-29 (×3): 40 mg via ORAL
  Filled 2015-01-28 (×3): qty 1

## 2015-01-28 MED ORDER — LEVOTHYROXINE SODIUM 125 MCG PO TABS: 125.0000 ug | ORAL_TABLET | Freq: Every day | ORAL | Status: DC

## 2015-01-28 MED ORDER — FLUTICASONE PROPIONATE 50 MCG/ACT NA SUSP
2.0000 | Freq: Every day | NASAL | Status: DC
  Administered 2015-01-28 – 2015-01-29 (×2): 2 via NASAL
  Filled 2015-01-28: qty 16

## 2015-01-28 MED ORDER — GABAPENTIN 300 MG PO CAPS
300.0000 mg | ORAL_CAPSULE | Freq: Three times a day (TID) | ORAL | Status: DC
  Administered 2015-01-28 – 2015-01-29 (×4): 300 mg via ORAL
  Filled 2015-01-28 (×4): qty 1

## 2015-01-28 MED ORDER — SODIUM CHLORIDE 0.9 % IJ SOLN
3.0000 mL | Freq: Two times a day (BID) | INTRAMUSCULAR | Status: DC
  Administered 2015-01-28 (×2): 3 mL via INTRAVENOUS

## 2015-01-28 MED ORDER — SODIUM CHLORIDE 1 G PO TABS
1.0000 g | ORAL_TABLET | Freq: Three times a day (TID) | ORAL | Status: DC
  Administered 2015-01-28 – 2015-01-29 (×4): 1 g via ORAL
  Filled 2015-01-28 (×6): qty 1

## 2015-01-28 MED ORDER — SERTRALINE HCL 100 MG PO TABS
100.0000 mg | ORAL_TABLET | Freq: Every day | ORAL | Status: DC
  Administered 2015-01-28 – 2015-01-29 (×2): 100 mg via ORAL
  Filled 2015-01-28 (×2): qty 1

## 2015-01-28 MED ORDER — ACETAMINOPHEN 325 MG PO TABS
650.0000 mg | ORAL_TABLET | Freq: Four times a day (QID) | ORAL | Status: DC | PRN
  Administered 2015-01-28: 650 mg via ORAL
  Filled 2015-01-28: qty 2

## 2015-01-28 MED ORDER — MOMETASONE FURO-FORMOTEROL FUM 100-5 MCG/ACT IN AERO
2.0000 | INHALATION_SPRAY | Freq: Two times a day (BID) | RESPIRATORY_TRACT | Status: DC
  Administered 2015-01-28 – 2015-01-29 (×3): 2 via RESPIRATORY_TRACT
  Filled 2015-01-28: qty 8.8

## 2015-01-28 MED ORDER — AMLODIPINE BESYLATE 5 MG PO TABS
5.0000 mg | ORAL_TABLET | Freq: Every day | ORAL | Status: DC
  Administered 2015-01-28 – 2015-01-29 (×2): 5 mg via ORAL
  Filled 2015-01-28: qty 2
  Filled 2015-01-28: qty 1

## 2015-01-28 MED ORDER — ADULT MULTIVITAMIN W/MINERALS CH
1.0000 | ORAL_TABLET | Freq: Every day | ORAL | Status: DC
  Administered 2015-01-28 – 2015-01-29 (×2): 1 via ORAL
  Filled 2015-01-28 (×2): qty 1

## 2015-01-28 MED ORDER — LEVOTHYROXINE SODIUM 100 MCG PO TABS: 100.0000 ug | ORAL_TABLET | Freq: Every day | ORAL | Status: DC

## 2015-01-28 MED ORDER — ACETAMINOPHEN 650 MG RE SUPP: 650.0000 mg | Freq: Four times a day (QID) | RECTAL | Status: DC | PRN

## 2015-01-28 MED ORDER — LATANOPROST 0.005 % OP SOLN
1.0000 [drp] | Freq: Every day | OPHTHALMIC | Status: DC
  Administered 2015-01-28 (×2): 1 [drp] via OPHTHALMIC
  Filled 2015-01-28: qty 2.5

## 2015-01-28 NOTE — Progress Notes (Signed)
Pt and daughter agree to sign blood consent.  Consent placed in chart and pt receiving blood.  Will continue to monitor.

## 2015-01-28 NOTE — Progress Notes (Signed)
Initial Nutrition Assessment  DOCUMENTATION CODES:   Non-severe (moderate) malnutrition in context of chronic illness  INTERVENTION:  Provide Boost Breeze po TID, each supplement provides 250 kcal and 9 grams of protein Provide Pro-stat TID, each dose provides 100 kcal and 15 grams of protein   NUTRITION DIAGNOSIS:   Inadequate oral intake related to altered GI function as evidenced by per patient/family report.   GOAL:   Patient will meet greater than or equal to 90% of their needs   MONITOR:   PO intake, Supplement acceptance, Labs, Weight trends, Skin  REASON FOR ASSESSMENT:   Malnutrition Screening Tool    ASSESSMENT:   81 y.o. female with Past medical history of dyslipidemia, hypertension, small bowel obstruction status post exploratory laparotomy, hypothyroidism, type 2 diabetes mellitus with neuropathy, gastroparesis, chronic constipation, SIADH.The patient is presenting with complaints of a passing out episode.The patient was recently hospitalized and discharged earlier in the morning of 01/27/2015. After going home the patient was in the shower and suddenly become dizzy and lightheaded and passed out.  During previous admission patient required TPN for nutrition support and was advanced to a Dysphagia one diet on 10/24. Pt was only eating about 25% of meals; she reports eating poorly due to excessive gas and constipation. Patient received Boost Breeze nutritional supplements and Pro-stat protein supplements during previous admission and tolerated well. Patient continues to eat poorly. She has moderate muscle wasting and fat wasting per physical exam. Discussed diet tips to help manage constipation. Discussed with patient and daughter ways to increase calorie and protein intake of pureed diet.  Labs: low sodium, low hemoglobin  Diet Order:  DIET - DYS 1 Room service appropriate?: Yes; Fluid consistency:: Thin  Skin:  Wound (see comment) (closed abdominal  incision)  Last BM:  10/25  Height:   Ht Readings from Last 1 Encounters:  01/28/15 5\' 6"  (1.676 m)    Weight:   Wt Readings from Last 1 Encounters:  01/28/15 128 lb (58.06 kg)    Ideal Body Weight:  59.1 kg  BMI:  Body mass index is 20.67 kg/(m^2).  Estimated Nutritional Needs:   Kcal:  1500-1700  Protein:  80-90 grams  Fluid:  1.5-1.7 L/day  EDUCATION NEEDS:   No education needs identified at this time  [place], LDN Inpatient Clinical Dietitian Pager: [phone] After Hours Pager: [phone]

## 2015-01-28 NOTE — Progress Notes (Signed)
Pt daughter called upset that MD has not contacted her and is concerned about her mother receiving blood.  MD text/paged with daughters phone number.

## 2015-01-28 NOTE — Evaluation (Signed)
Physical Therapy Evaluation Patient Details Name: Shannon [name] MRN: [id number] DOB: [birth year] Today's Date: 01/28/2015   History of Present Illness  81 y.o. female re-admitted to hospital after having a syncopal episode the same day she had been discharged after having SBO,  s/p Exploratory Laparotomy.  Pt with h/o gastric outlet obstruction, SBO, sequela of a perforated pyloric / duodenal ulcer and SIADH (syndrome of inappropriate ADH production)   Clinical Impression  Pt admitted with above diagnosis. Pt currently with functional limitations due to the deficits listed below (see PT Problem List).  Pt will benefit from skilled PT to increase their independence and safety with mobility to allow discharge to the venue listed below.  Pt o2 sat 98-100% on room air with gait.  Recommend HHPT.     Follow Up Recommendations Home health PT;Supervision/Assistance - 24 hour    Equipment Recommendations  None recommended by PT    Recommendations for Other Services       Precautions / Restrictions Precautions Precautions: Fall      Mobility  Bed Mobility   Bed Mobility: Supine to Sit     Supine to sit: Min guard     General bed mobility comments: use of bed rail  Transfers Overall transfer level: Needs assistance Equipment used: Rolling walker (2 wheeled) Transfers: Sit to/from [employer] Sit to Stand: Min guard Stand pivot transfers: Min guard       General transfer comment: good use of UE  Ambulation/Gait Ambulation/Gait assistance: Min guard Ambulation Distance (Feet): 150 Feet Assistive device: Rolling walker (2 wheeled) Gait Pattern/deviations: Step-through pattern Gait velocity: decreased   General Gait Details: cues for posture  Stairs            Wheelchair Mobility    Modified Rankin (Stroke Patients Only)       Balance     Sitting balance-Leahy Scale: Fair     Standing balance support: During functional activity Standing  balance-Leahy Scale: Fair Standing balance comment: Able to clean self after using BSC                             Pertinent Vitals/Pain Pain Assessment: Faces Faces Pain Scale: Hurts a little bit Pain Location: "constipated" and some L chest discomfort at end of session Pain Descriptors / Indicators: Aching Pain Intervention(s): Monitored during session;Other (comment) (nurse informed)    Home Living Family/patient expects to be discharged to:: Private residence Living Arrangements: Children Available Help at Discharge: Family;Available 24 hours/day Type of Home: House Home Access: Ramped entrance     Home Layout: One level Home Equipment: Walker - 2 wheels;Walker - 4 wheels;Cane - single point;Shower seat;Hand held shower head;Adaptive equipment;Wheelchair - manual      Prior Function Level of Independence: Independent;Independent with assistive device(s)         Comments: AMb with RW     Hand Dominance   Dominant Hand: Right    Extremity/Trunk Assessment   Upper Extremity Assessment: Overall WFL for tasks assessed           Lower Extremity Assessment: Overall WFL for tasks assessed;Generalized weakness      Cervical / Trunk Assessment: Normal  Communication   Communication: No difficulties  Cognition Arousal/Alertness: Awake/alert Behavior During Therapy: WFL for tasks assessed/performed Overall Cognitive Status: Within Functional Limits for tasks assessed                      General  Comments General comments (skin integrity, edema, etc.): No family present.  O2 98% on room air with gait    Exercises        Assessment/Plan    PT Assessment Patient needs continued PT services  PT Diagnosis Generalized weakness   PT Problem List Decreased strength;Decreased activity tolerance;Decreased mobility;Decreased balance  PT Treatment Interventions Gait training;DME instruction;Functional mobility training;Therapeutic  activities;Therapeutic exercise;Patient/family education;Balance training   PT Goals (Current goals can be found in the Care Plan section) Acute Rehab PT Goals Patient Stated Goal: go home PT Goal Formulation: With patient Time For Goal Achievement: 02/04/15 Potential to Achieve Goals: Good    Frequency Min 3X/week   Barriers to discharge        Co-evaluation               End of Session Equipment Utilized During Treatment: Gait belt Activity Tolerance: Patient tolerated treatment well Patient left: in chair;with call bell/phone within reach Nurse Communication: Mobility status;Other (comment) (pt c/o some L chest discomfort at end of session)    Functional Assessment Tool Used: clinical judgement and objective findings Functional Limitation: Mobility: Walking and moving around Mobility: Walking and Moving Around Current Status ([id number]): At least 1 percent but less than 20 percent impaired, limited or restricted Mobility: Walking and Moving Around Goal Status [phone]): At least 1 percent but less than 20 percent impaired, limited or restricted    Time: 2130-8657 PT Time Calculation (min) (ACUTE ONLY): 29 min   Charges:   PT Evaluation $Initial PT Evaluation Tier I: 1 Procedure PT Treatments $Gait Training: 8-22 mins   PT G Codes:   PT G-Codes **NOT FOR INPATIENT CLASS** Functional Assessment Tool Used: clinical judgement and objective findings Functional Limitation: Mobility: Walking and moving around Mobility: Walking and Moving Around Current Status (Q4696): At least 1 percent but less than 20 percent impaired, limited or restricted Mobility: Walking and Moving Around Goal Status [phone]): At least 1 percent but less than 20 percent impaired, limited or restricted    [facility] LUBECK 01/28/2015, 12:54 PM

## 2015-01-28 NOTE — Progress Notes (Signed)
[facility] PROGRESS NOTE   Shannon [name] BJY:[id number] DOB: Mar 23, 1924 DOA: 01/27/2015 PCP: Kirt Boys, DO  HPI/Subjective: Feels okay, denies syncope episodes, patient never loss of consciousness. Complains about severe constipation.  Assessment/Plan: Principal Problem:   Hyponatremia Active Problems:   Essential hypertension, benign   GERD (gastroesophageal reflux disease)   DM (diabetes mellitus) type II uncontrolled, periph vascular disorder (HCC)   CVA (cerebral vascular accident) (HCC)   Diabetic neuropathy (HCC)   Hypothyroidism   Slow transit constipation   Syncope   S/P exploratory laparotomy 01/20/15   This is a no charge note, patient seen earlier today by my colleague Dr. Allena Katz. Patient seen and examined, database reviewed. Discharge yesterday after prolonged and complex hospital stay for SBO and exploratory laparotomy done on 01/20/15. Had episode of presyncope (never lost her consciousness) and diaphoresis brought back to the hospital. She is probably dehydrated, hemoglobin is down to 7.8, I will transfuse 1 unit of packed RBCs.  Code Status: DNR Family Communication: Plan discussed with the patient. Disposition Plan: Remains inpatient Diet: DIET - DYS 1 Room service appropriate?: Yes; Fluid consistency:: Thin  Consultants:  None  Procedures:  None  Antibiotics:  None   Objective: Filed Vitals:   01/28/15 0850  BP: 159/50  Pulse: 57  Temp: 97.4 F (36.3 C)  Resp: 22    Intake/Output Summary (Last 24 hours) at 01/28/15 1300 Last data filed at 01/28/15 0853  Gross per 24 hour  Intake   1148 ml  Output   1285 ml  Net   -137 ml   Filed Weights   01/28/15 0042  Weight: 58.06 kg (128 lb)    Exam: General: Alert and awake, oriented x3, not in any acute distress. HEENT: anicteric sclera, pupils reactive to light and accommodation, EOMI CVS: S1-S2 clear, no murmur rubs or gallops Chest: clear to auscultation bilaterally, no  wheezing, rales or rhonchi Abdomen: soft nontender, nondistended, normal bowel sounds, no organomegaly Extremities: no cyanosis, clubbing or edema noted bilaterally Neuro: Cranial nerves II-XII intact, no focal neurological deficits  Data Reviewed: Basic Metabolic Panel:  Recent Labs Lab 01/22/15 0445 01/22/15 0831  01/27/15 0410 01/27/15 2100 01/28/15 0132 01/28/15 0436 01/28/15 0747  NA 124* 133*  < > 129* 126* 130* 130* 134*  K 5.4* 4.1  < > 4.0 4.1 5.0 3.8 3.7  CL 98* 106  < > 96* 96* 101 102 105  CO2 20* 21*  < > 22 20* 21* 21* 21*  GLUCOSE 782* 158*  < > 116* 119* 110* 154* 124*  BUN 22* 23*  < > 32* 49* 49* 46* 43*  CREATININE 1.12* 1.05*  < > 0.88 1.16* 1.05* 1.02* 0.97  CALCIUM 8.4* 8.7*  < > 8.9 8.5* 8.4* 8.3* 8.5*  MG 2.3 1.9  --   --   --   --   --   --   PHOS 5.8* 3.1  --   --   --   --   --   --   < > = values in this interval not displayed. Liver Function Tests:  Recent Labs Lab 01/22/15 0445 01/22/15 0831 01/27/15 2100  AST 32 27 46*  ALT 38 38 52  ALKPHOS 83 93 68  BILITOT 0.4 0.3 0.2*  PROT 4.5* 5.0* 6.3*  ALBUMIN 2.1* 2.3* 2.6*   No results for input(s): LIPASE, AMYLASE in the last 168 hours. No results for input(s): AMMONIA in the last 168 hours. CBC:  Recent Labs Lab 01/23/15 0522  01/26/15 0625 01/27/15 0410 01/27/15 2100 01/28/15 0747  WBC 9.5 5.5 5.1 7.0 6.5  NEUTROABS 7.0  --   --   --  4.0  HGB 9.0* 8.6* 8.2* 8.8* 7.8*  HCT 24.8* 24.5* 23.7* 25.8* 22.6*  MCV 91.5 90.4 91.9 91.8 92.6  PLT 199 265 288 298 286   Cardiac Enzymes: No results for input(s): CKTOTAL, CKMB, CKMBINDEX, TROPONINI in the last 168 hours. BNP (last 3 results)  Recent Labs  05/27/14 1630  BNP 195.7*    ProBNP (last 3 results) No results for input(s): PROBNP in the last 8760 hours.  CBG:  Recent Labs Lab 01/24/15 2323 01/25/15 0315 01/25/15 1540 01/27/15 2102 01/28/15 0627  GLUCAP 134* 156* 124* 112* 131*    Micro Recent Results (from the  past 240 hour(s))  Culture, Urine     Status: None   Collection Time: 01/22/15 11:03 AM  Result Value Ref Range Status   Specimen Description URINE, CATHETERIZED  Final   Special Requests NONE  Final   Culture NO GROWTH 1 DAY  Final   Report Status 01/23/2015 FINAL  Final  Culture, blood (routine x 2)     Status: None   Collection Time: 01/22/15 12:14 PM  Result Value Ref Range Status   Specimen Description BLOOD RIGHT ANTECUBITAL  Final   Special Requests BOTTLES DRAWN AEROBIC AND ANAEROBIC 10CC  Final   Culture NO GROWTH 5 DAYS  Final   Report Status 01/27/2015 FINAL  Final  Culture, blood (routine x 2)     Status: None   Collection Time: 01/22/15 12:28 PM  Result Value Ref Range Status   Specimen Description BLOOD BLOOD RIGHT HAND  Final   Special Requests   Final    BOTTLES DRAWN AEROBIC AND ANAEROBIC 10CC BLUE, 5CC RED   Culture NO GROWTH 5 DAYS  Final   Report Status 01/27/2015 FINAL  Final  MRSA PCR Screening     Status: Abnormal   Collection Time: 01/22/15  4:12 PM  Result Value Ref Range Status   MRSA by PCR POSITIVE (A) NEGATIVE Final    Comment:        The GeneXpert MRSA Assay (FDA approved for NASAL specimens only), is one component of a comprehensive MRSA colonization surveillance program. It is not intended to diagnose MRSA infection nor to guide or monitor treatment for MRSA infections. RESULT CALLED TO, READ BACK BY AND VERIFIED WITH: S.DOTY,RN AT 1832 BY L.PITT 01/22/15   MRSA PCR Screening     Status: Abnormal   Collection Time: 01/28/15  2:55 AM  Result Value Ref Range Status   MRSA by PCR POSITIVE (A) NEGATIVE Final    Comment:        The GeneXpert MRSA Assay (FDA approved for NASAL specimens only), is one component of a comprehensive MRSA colonization surveillance program. It is not intended to diagnose MRSA infection nor to guide or monitor treatment for MRSA infections. RESULT CALLED TO, READ BACK BY AND VERIFIED WITH: LASHEETA @0449   01/28/15 MKELLY      Studies: Dg Chest 2 View  01/27/2015  CLINICAL DATA:  Syncope EXAM: CHEST  2 VIEW COMPARISON:  January 22, 2015 FINDINGS: There is elevation of the right hemidiaphragm, stable. There is no edema or consolidation. The heart size and pulmonary vascularity are normal. There is no appreciable adenopathy. There is atherosclerotic change in the aorta. There is degenerative change in both shoulders. Bones are osteoporotic with multiple compression fractures in the thoracic region. IMPRESSION: No  edema or consolidation. Electronically Signed   By: Bretta Bang III M.D.   On: 01/27/2015 21:59   Dg Abd 1 View  01/27/2015  CLINICAL DATA:  Acute onset of lower abdominal pain, status post surgery for small bowel obstruction. Syncope. Initial encounter. EXAM: ABDOMEN - 1 VIEW COMPARISON:  Abdominal radiograph performed 01/19/2015 FINDINGS: Skin staples are noted along the midline abdomen and pelvis. Clips are noted within the right upper quadrant, reflecting prior cholecystectomy. The visualized bowel gas pattern is unremarkable. No small or large bowel dilatation is seen to suggest obstruction. A moderate amount of stool is noted in the colon. No free intra-abdominal air is identified, though evaluation for free air is limited on a single supine view. No acute osseous abnormalities are seen. Scattered vascular calcifications are noted. IMPRESSION: Unremarkable bowel gas pattern; no free intra-abdominal air seen. Moderate amount of stool noted in the colon. Electronically Signed   By: Roanna Raider M.D.   On: 01/27/2015 22:04    Scheduled Meds: . amLODipine  5 mg Oral Daily  . aspirin EC  81 mg Oral q morning - 10a  . atorvastatin  10 mg Oral Daily  . [START ON 01/29/2015] Chlorhexidine Gluconate Cloth  6 each Topical Q0600  . cloNIDine  0.1 mg Oral BID  . cycloSPORINE  1 drop Both Eyes BID  . enoxaparin (LOVENOX) injection  30 mg Subcutaneous Q24H  . feeding supplement  1  Container Oral TID BM  . fluticasone  2 spray Each Nare Daily  . gabapentin  300 mg Oral TID  . latanoprost  1 drop Both Eyes QHS  . levofloxacin  500 mg Oral Q48H  . levothyroxine  225 mcg Oral QAC breakfast  . mometasone-formoterol  2 puff Inhalation BID  . mupirocin ointment  1 application Nasal BID  . pantoprazole  40 mg Oral BID  . polyethylene glycol  17 g Oral BID  . senna-docusate  1 tablet Oral BID  . sertraline  100 mg Oral Daily  . sodium chloride  3 mL Intravenous Q12H  . sodium chloride  1 g Oral TID   Continuous Infusions: . sodium chloride 75 mL/hr at 01/28/15 0140       Time spent: 35 minutes    [facility]  [facility] Pager [phone] If 7PM-7AM, please contact night-coverage at [email or web address], password [facility] 01/28/2015, 1:00 PM

## 2015-01-28 NOTE — H&P (Signed)
[facility] History and Physical  Patient: Shannon [name]  MRN: [id number]  DOB: [birth year]  DOS: the patient was seen and examined on 01/28/2015 PCP: Kirt Boys, DO  Referring physician: Dr. Clarene Duke Chief Complaint: Passing out episode  HPI: Shannon [name] is a 81 y.o. female with Past medical history of dyslipidemia, hypertension, small bowel obstruction status post exploratory laparotomy, hypothyroidism, type 2 diabetes mellitus with neuropathy, gastroparesis, chronic constipation, SIADH. The patient is presenting with complaints of a passing out episode. The patient was recently hospitalized and discharged earlier in the morning of 01/27/2015. After going home the patient was in the shower and suddenly become dizzy and lightheaded and passed out. She did not have any head injury or neck injury. The patient was feeling significantly weak and lethargic. She denies any nausea or vomiting or chest pain abdominal pain no focal deficit. We'll also wonder about a bladder hours also reported. No changes in her medications reported after going home. Patient denies any focal deficit or burning urination.  The patient is coming from home  At her baseline ambulates with walker And is independent for most of her ADL; manages her medication on her own.  Review of Systems: as mentioned in the history of present illness.  A comprehensive review of the other systems is negative.  Past Medical History  Diagnosis Date  . Pneumonia   . Hyperlipidemia   . Hypertension   . Small bowel obstruction (HCC)     "I don't know how many" (01/11/2015)  . Ventral hernia with bowel obstruction   . Asthma   . Hyperlipidemia   . Stroke (HCC)     "light one"  . COPD (chronic obstructive pulmonary disease) (HCC)   . Hypothyroidism   . Type II diabetes mellitus (HCC)   . Anemia   . History of blood transfusion   . GERD (gastroesophageal reflux disease)   . History of stomach ulcers   . Arthritis      "all over"  . Renal insufficiency   . Depression     "light case"  . SIADH (syndrome of inappropriate ADH production) (HCC)     Shannon [name] 01/10/2015   Past Surgical History  Procedure Laterality Date  . Tubal ligation    . Colectomy    . Hernia repair  2015  . Esophagogastroduodenoscopy N/A 01/19/2014    Procedure: ESOPHAGOGASTRODUODENOSCOPY (EGD);  Surgeon: Hilarie Fredrickson, MD;  Location: [facility];  Service: Endoscopy;  Laterality: N/A;  . Esophagogastroduodenoscopy N/A 01/20/2014    Procedure: ESOPHAGOGASTRODUODENOSCOPY (EGD);  Surgeon: Hilarie Fredrickson, MD;  Location: [facility];  Service: Endoscopy;  Laterality: N/A;  . Esophagogastroduodenoscopy N/A 03/19/2014    Procedure: ESOPHAGOGASTRODUODENOSCOPY (EGD);  Surgeon: Rachael Fee, MD;  Location: [facility];  Service: Endoscopy;  Laterality: N/A;  . Cholecystectomy open    . Ventral hernia repair  2015    incarcerated ventral hernia ([facility] 09/2013)/notes 01/10/2015  . Cataract extraction w/ intraocular lens  implant, bilateral    . Gastrojejunostomy      hx/notes 01/10/2015  . Laparotomy N/A 01/20/2015    Procedure: EXPLORATORY LAPAROTOMY;  Surgeon: Abigail Miyamoto, MD;  Location: [facility] OR;  Service: General;  Laterality: N/A;  . Lysis of adhesion N/A 01/20/2015    Procedure: LYSIS OF ADHESIONS < 1 HOUR;  Surgeon: Abigail Miyamoto, MD;  Location: [facility] OR;  Service: General;  Laterality: N/A;   Social History:  reports that she has never smoked. She has quit using smokeless tobacco. Her smokeless tobacco use included  Snuff. She reports that she does not drink alcohol or use illicit drugs.  No Known Allergies  Family History  Problem Relation Age of Onset  . Stroke Mother   . Heart attack Neg Hx   . Hypertension Mother   . Diabetes Brother     Prior to Admission medications   Medication Sig Start Date End Date Taking? Authorizing Provider  acetaminophen (TYLENOL) 325 MG tablet Take 650 mg by mouth every 6 (six) hours as needed for  mild pain or fever.   Yes Historical Provider, MD  ADVAIR DISKUS 250-50 MCG/DOSE AEPB INHALE 1 PUFF INTO THE LUNGS 2 (TWO) TIMES DAILY AS NEEDED FOR SHORTNESS OF BREATH 12/01/14  Yes Historical Provider, MD  albuterol (PROVENTIL HFA;VENTOLIN HFA) 108 (90 BASE) MCG/ACT inhaler Inhale 2 puffs into the lungs every 6 (six) hours as needed for wheezing or shortness of breath. 04/08/14  Yes Lars Masson, MD  Amino Acids-Protein Hydrolys (FEEDING SUPPLEMENT, PRO-STAT SUGAR FREE 64,) LIQD Take 30 mLs by mouth 3 (three) times daily. 01/27/15  Yes Shanker Levora Dredge, MD  amLODipine (NORVASC) 10 MG tablet Take 1 tablet (10 mg total) by mouth daily. For HTN 11/04/14  Yes Kirt Boys, DO  aspirin EC 81 MG tablet Take 81 mg by mouth every morning.   Yes Historical Provider, MD  atorvastatin (LIPITOR) 10 MG tablet TAKE 1 TABLET BY MOUTH EVERY DAY 09/11/14  Yes Kirt Boys, DO  b complex vitamins tablet Take 1 tablet by mouth 2 (two) times daily.   Yes Historical Provider, MD  calcium-vitamin D (OSCAL WITH D) 500-200 MG-UNIT per tablet Take 1 tablet by mouth daily with breakfast.    Yes Historical Provider, MD  chlorhexidine (PERIDEX) 0.12 % solution 15 mLs by Mouth Rinse route 2 (two) times daily. 03/26/14  Yes Alison Murray, MD  cloNIDine (CATAPRES) 0.1 MG tablet Take 1 tablet (0.1 mg total) by mouth 2 (two) times daily. 07/13/14  Yes Lars Masson, MD  feeding supplement (BOOST / RESOURCE BREEZE) LIQD Take 1 Container by mouth 3 (three) times daily between meals. 01/27/15  Yes Shanker Levora Dredge, MD  fluticasone (FLONASE) 50 MCG/ACT nasal spray Place 2 sprays into both nostrils daily. 08/07/14  Yes Kirt Boys, DO  gabapentin (NEURONTIN) 300 MG capsule Take 1 capsule (300 mg total) by mouth 3 (three) times daily. 09/23/14  Yes Kirt Boys, DO  isosorbide mononitrate (IMDUR) 30 MG 24 hr tablet Take one tablet by mouth once daily. If Chest Pains occur increase to two tablets daily. Patient taking differently:  Take 30-60 mg by mouth daily. Take one tablet by mouth once daily. If Chest Pains occur increase to two tablets daily. 07/22/14  Yes Monica Carter, DO  lactulose (CHRONULAC) 10 GM/15ML solution Take 15 mLs (10 g total) by mouth daily as needed for mild constipation. 06/02/14  Yes Mahima Pandey, MD  latanoprost (XALATAN) 0.005 % ophthalmic solution Place 1 drop into both eyes at bedtime. 06/02/14  Yes Mahima Glade Lloyd, MD  levofloxacin (LEVAQUIN) 500 MG tablet Take 1 tablet (500 mg total) by mouth every other day. 01/28/15 01/29/15 Yes Shanker Levora Dredge, MD  levothyroxine (SYNTHROID, LEVOTHROID) 100 MCG tablet Take 1 tablet (100 mcg total) by mouth daily. Take with dose (total ) 11/06/14  Yes Kirt Boys, DO  levothyroxine (SYNTHROID, LEVOTHROID) 125 MCG tablet Take 1 tablet (125 mcg total) by mouth daily before breakfast. Take with dose (total ) 11/06/14  Yes Kirt Boys, DO  metoprolol succinate (TOPROL-XL) 25  MG 24 hr tablet Take 1 tablet (25 mg total) by mouth daily. 09/23/14  Yes Kirt Boys, DO  Multiple Vitamins-Minerals (MULTIVITAMIN WITH MINERALS) tablet Take 1 tablet by mouth every morning.    Yes Historical Provider, MD  pantoprazole (PROTONIX) 40 MG tablet TAKE 1 TABLET BY MOUTH TWICE A DAY 08/07/14  Yes Kirt Boys, DO  Polyethyl Glycol-Propyl Glycol 0.4-0.3 % SOLN Place 1 drop into both eyes 4 (four) times daily.    Yes Historical Provider, MD  polyethylene glycol (MIRALAX / GLYCOLAX) packet Take 17 g by mouth 2 (two) times daily.   Yes Historical Provider, MD  RESTASIS 0.05 % ophthalmic emulsion USE 1 DROP INTO BOTH EYES TWICE DAILY 10/19/14  Yes Kirt Boys, DO  sennosides-docusate sodium (SENOKOT-S) 8.6-50 MG tablet Take 1 tablet by mouth 2 (two) times daily.    Yes Historical Provider, MD  sertraline (ZOLOFT) 100 MG tablet Take one tablet by mouth once daily for depression 11/16/14  Yes Kirt Boys, DO  simethicone (MYLICON) 80 MG chewable tablet Chew 1 tablet (80  mg total) by mouth 4 (four) times daily as needed for flatulence. 01/27/15  Yes Shanker Levora Dredge, MD  sodium chloride 1 G tablet TAKE 1 TABLET BY MOUTH THREE TIMES DAILY 11/04/14  Yes Kirt Boys, DO  traMADol (ULTRAM) 50 MG tablet Take by mouth 2 (two) times daily as needed for moderate pain.   Yes Historical Provider, MD    Physical Exam: Filed Vitals:   01/27/15 2300 01/27/15 2330 01/28/15 0000 01/28/15 0042  BP: 144/48 153/60 140/64 167/74  Pulse: 114 96 115   Temp:    98.4 F (36.9 C)  TempSrc:    Oral  Resp: 22 18 23 22   Height:    5\' 6"  (1.676 m)  Weight:    58.06 kg (128 lb)  SpO2: 99% 99% 100% 100%    General: Alert, Awake and Oriented to Time, Place and Person. Appear in mild distress Eyes: PERRL ENT: Oral Mucosa clear moist. Neck: no JVD Cardiovascular: S1 and S2 Present, no Murmur, Peripheral Pulses Present Respiratory: Bilateral Air entry equal and Decreased,  Clear to Auscultation, no Crackles, no wheezes Abdomen: Bowel Sound present, Soft and no tenderness Skin: no Rash Extremities: n Pedal edema, ono calf tenderness Neurologic: Grossly no focal neuro deficit.  Labs on Admission:  CBC:  Recent Labs Lab 01/22/15 0445 01/23/15 0522 01/26/15 0625 01/27/15 0410 01/27/15 2100  WBC 10.9* 9.5 5.5 5.1 7.0  NEUTROABS  --  7.0  --   --   --   HGB 8.5* 9.0* 8.6* 8.2* 8.8*  HCT 25.4* 24.8* 24.5* 23.7* 25.8*  MCV 96.2 91.5 90.4 91.9 91.8  PLT 170 199 265 288 298    CMP     Component Value Date/Time   NA 130* 01/28/2015 0436   NA 137 11/02/2014 1029   K 3.8 01/28/2015 0436   CL 102 01/28/2015 0436   CO2 21* 01/28/2015 0436   GLUCOSE 154* 01/28/2015 0436   GLUCOSE 94 11/02/2014 1029   BUN 46* 01/28/2015 0436   BUN 31* 11/02/2014 1029   CREATININE 1.02* 01/28/2015 0436   CREATININE 1.3* 05/06/2014   CALCIUM 8.3* 01/28/2015 0436   PROT 6.3* 01/27/2015 2100   PROT 6.8 11/02/2014 1029   ALBUMIN 2.6* 01/27/2015 2100   ALBUMIN 4.1 11/02/2014 1029   AST  46* 01/27/2015 2100   ALT 52 01/27/2015 2100   ALKPHOS 68 01/27/2015 2100   BILITOT 0.2* 01/27/2015 2100   BILITOT 0.3  11/02/2014 1029   GFRNONAA 47* 01/28/2015 0436   GFRAA 55* 01/28/2015 0436    No results for input(s): CKTOTAL, CKMB, CKMBINDEX, TROPONINI in the last 168 hours. BNP (last 3 results)  Recent Labs  05/27/14 1630  BNP 195.7*    ProBNP (last 3 results) No results for input(s): PROBNP in the last 8760 hours.   Radiological Exams on Admission: Dg Chest 2 View  01/27/2015  CLINICAL DATA:  Syncope EXAM: CHEST  2 VIEW COMPARISON:  January 22, 2015 FINDINGS: There is elevation of the right hemidiaphragm, stable. There is no edema or consolidation. The heart size and pulmonary vascularity are normal. There is no appreciable adenopathy. There is atherosclerotic change in the aorta. There is degenerative change in both shoulders. Bones are osteoporotic with multiple compression fractures in the thoracic region. IMPRESSION: No edema or consolidation. Electronically Signed   By: Bretta Bang III M.D.   On: 01/27/2015 21:59   Dg Abd 1 View  01/27/2015  CLINICAL DATA:  Acute onset of lower abdominal pain, status post surgery for small bowel obstruction. Syncope. Initial encounter. EXAM: ABDOMEN - 1 VIEW COMPARISON:  Abdominal radiograph performed 01/19/2015 FINDINGS: Skin staples are noted along the midline abdomen and pelvis. Clips are noted within the right upper quadrant, reflecting prior cholecystectomy. The visualized bowel gas pattern is unremarkable. No small or large bowel dilatation is seen to suggest obstruction. A moderate amount of stool is noted in the colon. No free intra-abdominal air is identified, though evaluation for free air is limited on a single supine view. No acute osseous abnormalities are seen. Scattered vascular calcifications are noted. IMPRESSION: Unremarkable bowel gas pattern; no free intra-abdominal air seen. Moderate amount of stool noted in the  colon. Electronically Signed   By: Roanna Raider M.D.   On: 01/27/2015 22:04   EKG: Independently reviewed. normal sinus rhythm, nonspecific ST and T waves changes.  Assessment/Plan 1. Hyponatremia Patient presents with complaints of syncope. Workup ER shows hyponatremia. Patient has been treated for hyponatremia due to SIADH but currently appears to be dehydrated. We will give her IV hydration and continue with sodium supplementation. Monitor BMP every 4 hours. Patient may need a reevaluation with physical therapy after the hospitalization.  2. Constipation. Patient will be resumed on her home regimen.  3  Essential hypertension, benign Blood pressure is stable  Reducing patient's amlodipine from 10 mg to 5 mg daily. Also the timing patient's clonidine starting tomorrow. Holding in the ER. Also holding metoprolol.  4  GERD (gastroesophageal reflux disease) Continuing PPI. Continuing simethicone.  5  DM (diabetes mellitus) type II uncontrolled, periph vascular disorder (HCC) Currently not on medication. We'll continue close monitoring.  6  CVA (cerebral vascular accident) (HCC)   Diabetic neuropathy (HCC) Continue gabapentin.  7  Hypothyroidism Continue Synthroid.  8  S/P exploratory laparotomy 01/20/15 Incision healing well.  Nutrition: Advance diet as tolerated regular diet DVT Prophylaxis: subcutaneous Heparin  Advance goals of care discussion: DNR/DNI  Disposition: Admitted as observation, telemetry unit.  Author: Lynden Oxford, MD [facility] Pager: [phone] 01/28/2015  If 7PM-7AM, please contact night-coverage [email or web address] Password [email or web address]

## 2015-01-28 NOTE — Progress Notes (Signed)
Pt states a history of receiving the wrong type of blood.  Pts daughter is to bring a card that staff has to see before she will agree to receive a blood transfusion.  Daughter due to be back this afternoon.

## 2015-01-28 NOTE — Progress Notes (Signed)
PT Cancellation Note  Patient Details Name: Shannon [name] MRN: [id number] DOB: 04-Jan-1924   Cancelled Treatment:    Reason Eval/Treat Not Completed: Patient at procedure or test/unavailable. Pt with MD then eating breakfast with nurse in room giving meds.  Will check back as schedule permits.   Moffet,Noel Henandez LUBECK 01/28/2015, 10:19 AM

## 2015-01-28 NOTE — Evaluation (Signed)
Occupational Therapy Evaluation Patient Details Name: Shannon [name] MRN: [id number] DOB: May 30, 1923 Today's Date: 01/28/2015    History of Present Illness 81 y.o. female re-admitted to hospital after having a syncopal episode the same day she had been discharged after having SBO,  s/p Exploratory Laparotomy.  Pt with h/o gastric outlet obstruction, SBO, sequela of a perforated pyloric / duodenal ulcer and SIADH (syndrome of inappropriate ADH production)    Clinical Impression   This 81 yo female admitted with above presents to acute OT with decreased balance, decreased mobility affecting her safety and independence with basic ADLs. She will benefit from acute OT with follow up HHOT and 24 hour S/prn A recommended.    Follow Up Recommendations  Home health OT;Supervision/Assistance - 24 hour    Equipment Recommendations  3 in 1 bedside comode       Precautions / Restrictions Precautions Precautions: Fall Restrictions Weight Bearing Restrictions: No      Mobility Bed Mobility       General bed mobility comments: Pt in recliiner  upon arrival  Transfers Overall transfer level: Needs assistance Equipment used: Rolling walker (2 wheeled) Transfers: Stand Pivot Transfers Sit to Stand: Min guard Stand pivot transfers: Min guard          Balance     Sitting balance-Leahy Scale: Fair     Standing balance support: During functional activity Standing balance-Leahy Scale: Fair Standing balance comment: Able to clean self after using BSC                            ADL Overall ADL's : Needs assistance/impaired Eating/Feeding: Independent;Sitting   Grooming: Wash/dry hands;Min guard;Standing   Upper Body Bathing: Set up;Supervision/ safety;Sitting   Lower Body Bathing: Minimal assistance (with min guard A sit<>stand)   Upper Body Dressing : Supervision/safety;Set up;Sitting   Lower Body Dressing: Minimal assistance (with min guard a sit<>stand)   Toilet  Transfer: Min guard;BSC;Stand-pivot   Toileting- Clothing Manipulation and Hygiene: Minimal assistance;Sit to/from stand                         Pertinent Vitals/Pain Pain Assessment: No/denies pain      Hand Dominance Right   Extremity/Trunk Assessment Upper Extremity Assessment Upper Extremity Assessment: Overall WFL for tasks assessed (slight tremors)     Communication Communication Communication: No difficulties   Cognition Arousal/Alertness: Awake/alert Behavior During Therapy: WFL for tasks assessed/performed Overall Cognitive Status: Within Functional Limits for tasks assessed                                Home Living Family/patient expects to be discharged to:: Private residence Living Arrangements: Children Available Help at Discharge: Family;Available 24 hours/day Type of Home: House Home Access: Ramped entrance     Home Layout: One level     Bathroom Shower/Tub: Tub/shower unit;Curtain Shower/tub characteristics: Engineer, building services: Standard Bathroom Accessibility: Yes   Home Equipment: Environmental consultant - 2 wheels;Walker - 4 wheels;Cane - single point;Shower seat;Hand held shower head;Adaptive equipment;Wheelchair - Recruitment consultant Equipment: Reacher;Long-handled shoe horn        Prior Functioning/Environment Level of Independence: Independent;Independent with assistive device(s)        Comments: AMb with RW    OT Diagnosis: Generalized weakness   OT Problem List: Decreased strength;Impaired balance (sitting and/or standing)   OT Treatment/Interventions: Self-care/ADL training;DME and/or AE instruction;Therapeutic activities;Patient/family  education    OT Goals(Current goals can be found in the care plan section) Acute Rehab OT Goals Patient Stated Goal: go home OT Goal Formulation: With patient Time For Goal Achievement: 02/11/15 Potential to Achieve Goals: Good  OT Frequency: Min 2X/week              End of Session     Activity Tolerance: Patient tolerated treatment well Patient left: in chair;with call bell/phone within reach;with chair alarm set   Time: 7829-5621 OT Time Calculation (min): 17 min Charges:  OT General Charges $OT Visit: 1 Procedure OT Evaluation $Initial OT Evaluation Tier I: 1 Procedure G-Codes: OT G-codes **NOT FOR INPATIENT CLASS** Functional Assessment Tool Used: Clinical observation Functional Limitation: Self care Self Care Current Status (H0865): At least 1 percent but less than 20 percent impaired, limited or restricted Self Care Goal Status (H8469): At least 1 percent but less than 20 percent impaired, limited or restricted  Evette Georges [phone] 01/28/2015, 1:57 PM

## 2015-01-28 NOTE — Progress Notes (Signed)
Pts daughter wants surgeon to see pt.  MD notified and told RN that surgeon is not needed at this time and we can reassess in the AM.  Will continue to monitor.

## 2015-01-28 NOTE — Progress Notes (Signed)
Patient transferred from the ED, via stretcher, to room 3E26. VSS with blood pressure slightly elevated. Oriented and educated patient to the room, room equipment and to the heart failure floor. CMT notified and confirmed placement of telemetry leads and box number. Patient currently states she has no pain except a little of discomfort from surgical site where staples are noted, whenever she coughs. Will review admission orders to see if anything can be given to help with discomfort. If patient wants it. Will continue to monitor patient to end of shift.

## 2015-01-28 NOTE — Progress Notes (Signed)
Pt's daughter requesting to speak with MD.  MD paged and informed daughter.

## 2015-01-29 DIAGNOSIS — E038 Other specified hypothyroidism: Secondary | ICD-10-CM

## 2015-01-29 DIAGNOSIS — E1151 Type 2 diabetes mellitus with diabetic peripheral angiopathy without gangrene: Secondary | ICD-10-CM

## 2015-01-29 DIAGNOSIS — E1142 Type 2 diabetes mellitus with diabetic polyneuropathy: Secondary | ICD-10-CM

## 2015-01-29 DIAGNOSIS — R55 Syncope and collapse: Secondary | ICD-10-CM

## 2015-01-29 DIAGNOSIS — Z9889 Other specified postprocedural states: Secondary | ICD-10-CM

## 2015-01-29 DIAGNOSIS — K5901 Slow transit constipation: Secondary | ICD-10-CM

## 2015-01-29 DIAGNOSIS — I10 Essential (primary) hypertension: Secondary | ICD-10-CM

## 2015-01-29 DIAGNOSIS — E1165 Type 2 diabetes mellitus with hyperglycemia: Secondary | ICD-10-CM

## 2015-01-29 LAB — BASIC METABOLIC PANEL
Anion gap: 7 (ref 5–15)
BUN: 31 mg/dL — ABNORMAL HIGH (ref 6–20)
CO2: 22 mmol/L (ref 22–32)
Calcium: 8.7 mg/dL — ABNORMAL LOW (ref 8.9–10.3)
Chloride: 107 mmol/L (ref 101–111)
Creatinine, Ser: 0.86 mg/dL (ref 0.44–1.00)
GFR calc Af Amer: >60 mL/min (ref >60)
GFR calc non Af Amer: 58 mL/min — ABNORMAL LOW (ref >60)
Glucose, Bld: 85 mg/dL (ref 65–99)
Potassium: 4.3 mmol/L (ref 3.5–5.1)
Sodium: 136 mmol/L (ref 135–145)

## 2015-01-29 LAB — TYPE AND SCREEN
ABO/RH(D): AB POS
Antibody Screen: NEGATIVE
Donor AG Type: NEGATIVE
Unit division: 0

## 2015-01-29 LAB — PROTIME-INR
INR: 1.2 (ref 0.00–1.49)
Prothrombin Time: 15.3 seconds — ABNORMAL HIGH (ref 11.6–15.2)

## 2015-01-29 LAB — CBC
HCT: 27.1 % — ABNORMAL LOW (ref 36.0–46.0)
Hemoglobin: 9.3 g/dL — ABNORMAL LOW (ref 12.0–15.0)
MCH: 30.5 pg (ref 26.0–34.0)
MCHC: 34.3 g/dL (ref 30.0–36.0)
MCV: 88.9 fL (ref 78.0–100.0)
Platelets: 307 10*3/uL (ref 150–400)
RBC: 3.05 MIL/uL — ABNORMAL LOW (ref 3.87–5.11)
RDW: 16.4 % — ABNORMAL HIGH (ref 11.5–15.5)
WBC: 7.2 10*3/uL (ref 4.0–10.5)

## 2015-01-29 LAB — GLUCOSE, CAPILLARY: Glucose-Capillary: 80 mg/dL (ref 65–99)

## 2015-01-29 NOTE — Progress Notes (Signed)
Occupational Therapy Treatment and Discharge Patient Details Name: Krislynn Esquivel MRN: [id number] DOB: 1923/10/28 Today's Date: 01/29/2015    History of present illness 81 y.o. female re-admitted to hospital after having a syncopal episode the same day she had been discharged after having SBO,  s/p Exploratory Laparotomy.  Pt with h/o gastric outlet obstruction, SBO, sequela of a perforated pyloric / duodenal ulcer and SIADH (syndrome of inappropriate ADH production)    OT comments  This 81 yo female admitted with above presents to acute OT with making progress with basic ADLs and mobility with family to be with her 24/7 at least initially. She will continue to benefit from [facility] , acute OT will sign off.  Follow Up Recommendations  Home health OT;Supervision/Assistance - 24 hour    Equipment Recommendations  3 in 1 bedside comode       Precautions / Restrictions Precautions Precautions: Fall Restrictions Weight Bearing Restrictions: No       Mobility Bed Mobility               General bed mobility comments: Pt in recliiner  upon arrival  Transfers Overall transfer level: Needs assistance Equipment used: Rolling walker (2 wheeled) Transfers: Sit to/from Stand Sit to Stand: Supervision              Balance Overall balance assessment: Needs assistance Sitting-balance support: No upper extremity supported;Feet supported Sitting balance-Leahy Scale: Fair     Standing balance support: During functional activity Standing balance-Leahy Scale: Fair Standing balance comment: supports self with sink while washing hands                   ADL Overall ADL's : Needs assistance/impaired     Grooming: Wash/dry hands;Supervision/safety;Standing                   Toilet Transfer: Supervision/safety;Ambulation;Comfort height toilet;Grab bars;RW   Toileting- Clothing Manipulation and Hygiene: Supervision/safety;Sit to/from stand                           Cognition   Behavior During Therapy: [facility] for tasks assessed/performed Overall Cognitive Status: Within Functional Limits for tasks assessed                                    Pertinent Vitals/ Pain       Pain Assessment: No/denies pain         Frequency Min 2X/week     Progress Toward Goals  OT Goals(current goals can now be found in the care plan section)  Progress towards OT goals: Progressing toward goals     Plan Discharge plan remains appropriate       End of Session Equipment Utilized During Treatment: Rolling walker   Activity Tolerance Patient tolerated treatment well   Patient Left in chair;with call bell/phone within reach;with chair alarm set;with family/visitor present   Nurse Communication  (CM: pt waiting on [facility] services to be set up and 3n1)    Functional Assessment Tool Used: Clinical observation Functional Limitation: Self care Self Care Current Status (Z6010): At least 1 percent but less than 20 percent impaired, limited or restricted Self Care Discharge Status [phone]): At least 1 percent but less than 20 percent impaired, limited or restricted   Time: 1300-1312 OT Time Calculation (min): 12 min  Charges: OT G-codes **NOT FOR INPATIENT CLASS** Functional Assessment Tool Used: Clinical  observation Functional Limitation: Self care Self Care Current Status [phone]): At least 1 percent but less than 20 percent impaired, limited or restricted Self Care Discharge Status [phone]): At least 1 percent but less than 20 percent impaired, limited or restricted OT General Charges $OT Visit: 1 Procedure OT Treatments $Self Care/Home Management : 8-22 mins  Evette Georges [phone] 01/29/2015, 2:28 PM

## 2015-01-29 NOTE — Discharge Summary (Addendum)
Physician Discharge Summary  Shannon [name] HYQ:[id number] DOB: April 29, 1923 DOA: 01/27/2015  PCP: Kirt Boys, DO  Admit date: 01/27/2015 Discharge date: 01/29/2015  Time spent: 40 minutes  Recommendations for Outpatient Follow-up:  1. Patient follow-up with primary care physician within one week. 2. Follow-up with general surgery, office will call you. 3. Needs hemoglobin A1c and TSH to be checked within the next office visit.  Discharge Diagnoses:  Principal Problem:   Hyponatremia Active Problems:   Essential hypertension, benign   GERD (gastroesophageal reflux disease)   DM (diabetes mellitus) type II uncontrolled, periph vascular disorder (HCC)   CVA (cerebral vascular accident) (HCC)   Diabetic neuropathy (HCC)   Hypothyroidism   Slow transit constipation   Syncope   S/P exploratory laparotomy 01/20/15   Malnutrition of moderate degree   Discharge Condition: Stable  Diet recommendation: Dysphagia 1 diet with thin liquids  Filed Weights   01/28/15 0042 01/29/15 0520  Weight: 58.06 kg (128 lb) 58.623 kg (129 lb 3.8 oz)    History of present illness:  [name] is a 81 y.o. female with Past medical history of dyslipidemia, hypertension, small bowel obstruction status post exploratory laparotomy, hypothyroidism, type 2 diabetes mellitus with neuropathy, gastroparesis, chronic constipation, SIADH. The patient is presenting with complaints of a passing out episode. The patient was recently hospitalized and discharged earlier in the morning of 01/27/2015. After going home the patient was in the shower and suddenly become dizzy and lightheaded and passed out. She did not have any head injury or neck injury. The patient was feeling significantly weak and lethargic. She denies any nausea or vomiting or chest pain abdominal pain no focal deficit. We'll also wonder about a bladder hours also reported. No changes in her medications reported after going home. Patient denies  any focal deficit or burning urination.  The patient is coming from home  At her baseline ambulates with walker And is independent for most of her ADL; manages her medication on her own.  Hospital Course:   Hyponatremia Patient presents with complaints of syncope. Workup ER shows hyponatremia. Patient has been treated for hyponatremia due to SIADH but currently appears to be dehydrated. We will give her IV hydration and continue with sodium supplementation. Patient given IV fluids for hydration, sodium is 136 prior to discharge.  Presyncope Patient did have sweating and feeling that she going to collapse but she never lost her consciousness. This is appears to be vasovagal, this is comes after straining for bowel movement. Patient also has pain secondary to her abdominal laparotomy incision which also might be contributing. This is resolved, patient hydrated with IV fluids, no symptoms while she is in the hospital. No further workup.  Constipation. Complaining about severe constipation, there is also moderate stool burden on the abdominal x-ray. Abdominal x-ray did not show any signs or suggestion of SBO. Magnesium citrate given, patient had more than 5 bowel movements prior to discharge.  Essential hypertension, benign Home medications restarted.  GERD (gastroesophageal reflux disease) Continuing PPI. Continuing simethicone.  DM (diabetes mellitus) type II uncontrolled, periph vascular disorder (HCC) Currently not on medication. We'll continue close monitoring.  CVA (cerebral vascular accident) (HCC)  Diabetic neuropathy (HCC) Continue gabapentin.  Hypothyroidism Continue Synthroid.  S/P exploratory laparotomy 01/20/15 Incision healing well, follow-up with general surgery as outpatient.  Anemia Probably acute anemia of acute illness, hemoglobin dropped to 7.8 after admission probably part of it as hemodilution. Had 1 unit of blood transfused yesterday, patient  feeling much better.  Procedures:  None  Consultations:  None  Discharge Exam: Filed Vitals:   01/29/15 1100  BP: 147/57  Pulse: 77  Temp: 98.6 F (37 C)  Resp: 20   General: Alert and awake, oriented x3, not in any acute distress. HEENT: anicteric sclera, pupils reactive to light and accommodation, EOMI CVS: S1-S2 clear, no murmur rubs or gallops Chest: clear to auscultation bilaterally, no wheezing, rales or rhonchi Abdomen: soft nontender, nondistended, normal bowel sounds, no organomegaly Extremities: no cyanosis, clubbing or edema noted bilaterally Neuro: Cranial nerves II-XII intact, no focal neurological deficits  Discharge Instructions   Discharge Instructions    Diet - low sodium heart healthy    Complete by:  As directed      Increase activity slowly    Complete by:  As directed           Current Discharge Medication List    CONTINUE these medications which have NOT CHANGED   Details  acetaminophen (TYLENOL) 325 MG tablet Take 650 mg by mouth every 6 (six) hours as needed for mild pain or fever.    ADVAIR DISKUS 250-50 MCG/DOSE AEPB INHALE 1 PUFF INTO THE LUNGS 2 (TWO) TIMES DAILY AS NEEDED FOR SHORTNESS OF BREATH Refills: 3    albuterol (PROVENTIL HFA;VENTOLIN HFA) 108 (90 BASE) MCG/ACT inhaler Inhale 2 puffs into the lungs every 6 (six) hours as needed for wheezing or shortness of breath. Qty: 1 Inhaler, Refills: 2   Associated Diagnoses: Essential hypertension, benign; Chronic atrial fibrillation (HCC)    amLODipine (NORVASC) 10 MG tablet Take 1 tablet (10 mg total) by mouth daily. For HTN Qty: 30 tablet, Refills: 3    aspirin EC 81 MG tablet Take 81 mg by mouth every morning.    atorvastatin (LIPITOR) 10 MG tablet TAKE 1 TABLET BY MOUTH EVERY DAY Qty: 30 tablet, Refills: 5    b complex vitamins tablet Take 1 tablet by mouth 2 (two) times daily.    calcium-vitamin D (OSCAL WITH D) 500-200 MG-UNIT per tablet Take 1 tablet by mouth daily with  breakfast.     chlorhexidine (PERIDEX) 0.12 % solution 15 mLs by Mouth Rinse route 2 (two) times daily. Qty: 120 mL, Refills: 0    cloNIDine (CATAPRES) 0.1 MG tablet Take 1 tablet (0.1 mg total) by mouth 2 (two) times daily. Qty: 60 tablet, Refills: 11    feeding supplement (BOOST / RESOURCE BREEZE) LIQD Take 1 Container by mouth 3 (three) times daily between meals. Qty: 90 Container, Refills: 0    fluticasone (FLONASE) 50 MCG/ACT nasal spray Place 2 sprays into both nostrils daily. Qty: 16 g, Refills: 3    gabapentin (NEURONTIN) 300 MG capsule Take 1 capsule (300 mg total) by mouth 3 (three) times daily. Qty: 90 capsule, Refills: 3    isosorbide mononitrate (IMDUR) 30 MG 24 hr tablet Take one tablet by mouth once daily. If Chest Pains occur increase to two tablets daily. Qty: 90 tablet, Refills: 3   Associated Diagnoses: Essential hypertension, benign; Chronic atrial fibrillation (HCC)    lactulose (CHRONULAC) 10 GM/15ML solution Take 15 mLs (10 g total) by mouth daily as needed for mild constipation. Qty: 240 mL, Refills: 0    latanoprost (XALATAN) 0.005 % ophthalmic solution Place 1 drop into both eyes at bedtime. Qty: 2.5 mL, Refills: 12    !! levothyroxine (SYNTHROID, LEVOTHROID) 100 MCG tablet Take 1 tablet (100 mcg total) by mouth daily. Take with dose (total ) Qty: 30 tablet, Refills: 3  Associated Diagnoses: Hypothyroidism, unspecified hypothyroidism type    !! levothyroxine (SYNTHROID, LEVOTHROID) 125 MCG tablet Take 1 tablet (125 mcg total) by mouth daily before breakfast. Take with dose (total ) Qty: 30 tablet, Refills: 3   Associated Diagnoses: Hypothyroidism, unspecified hypothyroidism type    metoprolol succinate (TOPROL-XL) 25 MG 24 hr tablet Take 1 tablet (25 mg total) by mouth daily. Qty: 30 tablet, Refills: 3    Multiple Vitamins-Minerals (MULTIVITAMIN WITH MINERALS) tablet Take 1 tablet by mouth every morning.     pantoprazole  (PROTONIX) 40 MG tablet TAKE 1 TABLET BY MOUTH TWICE A DAY Qty: 60 tablet, Refills: 11    Polyethyl Glycol-Propyl Glycol 0.4-0.3 % SOLN Place 1 drop into both eyes 4 (four) times daily.     polyethylene glycol (MIRALAX / GLYCOLAX) packet Take 17 g by mouth 2 (two) times daily.    RESTASIS 0.05 % ophthalmic emulsion USE 1 DROP INTO BOTH EYES TWICE DAILY Qty: 60 mL, Refills: 1    sennosides-docusate sodium (SENOKOT-S) 8.6-50 MG tablet Take 1 tablet by mouth 2 (two) times daily.     sertraline (ZOLOFT) 100 MG tablet Take one tablet by mouth once daily for depression Qty: 30 tablet, Refills: 3    simethicone (MYLICON) 80 MG chewable tablet Chew 1 tablet (80 mg total) by mouth 4 (four) times daily as needed for flatulence. Qty: 30 tablet, Refills: 0    sodium chloride 1 G tablet TAKE 1 TABLET BY MOUTH THREE TIMES DAILY Qty: 90 tablet, Refills: 2    traMADol (ULTRAM) 50 MG tablet Take by mouth 2 (two) times daily as needed for moderate pain.     !! - Potential duplicate medications found. Please discuss with provider.    STOP taking these medications     Amino Acids-Protein Hydrolys (FEEDING SUPPLEMENT, PRO-STAT SUGAR FREE 64,) LIQD      levofloxacin (LEVAQUIN) 500 MG tablet        No Known Allergies Follow-up Information    Follow up with Kirt Boys, DO On 02/02/2015.   Specialty:  Internal Medicine   Why:  post hospital appt. @ 2:45pm .. confirmed w/ Leotis Pain information:   [address] [place] [address] [phone]        The results of significant diagnostics from this hospitalization (including imaging, microbiology, ancillary and laboratory) are listed below for reference.    Significant Diagnostic Studies: Dg Chest 2 View  01/27/2015  CLINICAL DATA:  Syncope EXAM: CHEST  2 VIEW COMPARISON:  January 22, 2015 FINDINGS: There is elevation of the right hemidiaphragm, stable. There is no edema or consolidation. The heart size and pulmonary  vascularity are normal. There is no appreciable adenopathy. There is atherosclerotic change in the aorta. There is degenerative change in both shoulders. Bones are osteoporotic with multiple compression fractures in the thoracic region. IMPRESSION: No edema or consolidation. Electronically Signed   By: Bretta Bang III M.D.   On: 01/27/2015 21:59   Dg Abd 1 View  01/27/2015  CLINICAL DATA:  Acute onset of lower abdominal pain, status post surgery for small bowel obstruction. Syncope. Initial encounter. EXAM: ABDOMEN - 1 VIEW COMPARISON:  Abdominal radiograph performed 01/19/2015 FINDINGS: Skin staples are noted along the midline abdomen and pelvis. Clips are noted within the right upper quadrant, reflecting prior cholecystectomy. The visualized bowel gas pattern is unremarkable. No small or large bowel dilatation is seen to suggest obstruction. A moderate amount of stool is noted in the colon. No free  intra-abdominal air is identified, though evaluation for free air is limited on a single supine view. No acute osseous abnormalities are seen. Scattered vascular calcifications are noted. IMPRESSION: Unremarkable bowel gas pattern; no free intra-abdominal air seen. Moderate amount of stool noted in the colon. Electronically Signed   By: Roanna Raider M.D.   On: 01/27/2015 22:04   Dg Abd 1 View  01/11/2015  CLINICAL DATA:  Small bowel obstruction EXAM: ABDOMEN - 1 VIEW COMPARISON:  CT 01/10/2015 FINDINGS: Prior cholecystectomy. NG tube is in the stomach. No gas-filled dilated small bowel loops noted. No free air organomegaly. No acute bony abnormality. IMPRESSION: No gas-filled dilated small bowel loops noted. NG tube remains present in the stomach. Electronically Signed   By: Charlett Nose M.D.   On: 01/11/2015 08:52   Dg Abd 1 View  01/10/2015  CLINICAL DATA:  Assess nasogastric placement. EXAM: ABDOMEN - 1 VIEW COMPARISON:  None. FINDINGS: Nasogastric tube enters the stomach, passes to the fundus and  along the greater curvature with its tip in the antrum. IMPRESSION: Nasogastric tube tip at the gastric antrum. Electronically Signed   By: Paulina Fusi M.D.   On: 01/10/2015 14:15   Ct Abdomen Pelvis W Contrast  01/10/2015  CLINICAL DATA:  Acute onset of nausea, vomiting and generalized abdominal pain. Initial encounter. EXAM: CT ABDOMEN AND PELVIS WITH CONTRAST TECHNIQUE: Multidetector CT imaging of the abdomen and pelvis was performed using the standard protocol following bolus administration of intravenous contrast. CONTRAST:  75mL OMNIPAQUE IOHEXOL 300 MG/ML  SOLN COMPARISON:  CT of the abdomen and pelvis performed 05/21/2014 FINDINGS: Minimal right basilar atelectasis is noted. Calcification is noted at the aortic valve. There is distention of multiple small bowel loops to 3.4 cm in maximal diameter. Trace associated free fluid is seen. Multiple foci of decompression are seen, with decompression of the distal ileum. This likely reflects small bowel dysmotility, though partial small-bowel obstruction cannot be excluded. A small bowel anastomosis at the left mid abdomen is grossly unremarkable. There is slight extension of a wall of the transverse colon into a tiny right periumbilical hernia, without evidence for incarceration. A tiny left periumbilical hernia is also seen, containing only fat. Scattered small hypodensities within the liver are nonspecific but may reflect small cysts. The spleen is unremarkable in appearance. The patient is status post cholecystectomy, with clips noted at the gallbladder fossa. The pancreas and adrenal glands are unremarkable. Tiny bilateral renal cysts are seen. The kidneys are otherwise unremarkable. There is no evidence of hydronephrosis. No renal or ureteral stones are seen. No perinephric stranding is appreciated. The stomach is within normal limits. No acute vascular abnormalities are seen. Diffuse calcification is noted along the abdominal aorta and its branches. The  appendix is not definitely seen; there is no evidence for appendicitis. Scattered diverticulosis is noted along the entirety of the colon, without evidence of diverticulitis. The bladder is mildly distended and grossly unremarkable. The uterus is unremarkable in appearance. The right ovary is unremarkable in appearance. No suspicious adnexal masses are seen. No inguinal lymphadenopathy is seen. No acute osseous abnormalities are identified. There is mild chronic compression deformity of vertebral body T11. IMPRESSION: 1. Distention of multiple small bowel loops to 3.4 cm in maximal diameter, with trace associated free fluid. Multiple foci of decompression noted along the small bowel, with decompression of the distal ileum. This likely reflects some degree of small bowel dysmotility, though partial small bowel obstruction cannot be excluded. 2. Slight extension of the wall  of the transverse colon into a tiny right periumbilical hernia, without evidence for incarceration. Tiny left periumbilical hernia also seen, containing only fat. 3. Diffuse calcification along the abdominal aorta and its branches. 4. Scattered diverticulosis along the entirety of the colon, without evidence of diverticulitis. 5. Calcification at the aortic valve. Electronically Signed   By: Roanna Raider M.D.   On: 01/10/2015 03:58   Dg Chest Port 1 View  01/22/2015  CLINICAL DATA:  Cough and fever.  Small bowel obstruction. EXAM: PORTABLE CHEST 1 VIEW COMPARISON:  03/27/2014 FINDINGS: A left subclavian central venous catheter is seen with tip overlying the mid to distal SVC. No evidence of pneumothorax. Nasogastric tube seen entering the stomach. New bibasilar pulmonary opacity may be due to atelectasis or infiltrates. Mild left pleural thickening versus small pleural effusion remains stable. Cardiomegaly is also stable. IMPRESSION: Support apparatus in appropriate position. No evidence of pneumothorax. New bibasilar infiltrates versus  atelectasis. Stable mild left pleural thickening versus effusion. Stable cardiomegaly. Electronically Signed   By: Myles Rosenthal M.D.   On: 01/22/2015 13:06   Dg Abd 2 Views  01/18/2015  CLINICAL DATA:  Small bowel obstruction.  Enteric tube. EXAM: ABDOMEN - 2 VIEW COMPARISON:  Abdominal radiograph from 1 day prior. FINDINGS: Enteric tube loops in the proximal stomach and terminates in the body of the stomach. Surgical sutures are again seen in the left upper quadrant. Mildly to moderately dilated small bowel loops in the central and right abdomen measuring up to 4.4 cm diameter, not appreciably changed. Mild stool throughout the colon. No evidence of pneumatosis or pneumoperitoneum. IMPRESSION: 1. Enteric tube loops in the proximal stomach and terminates in the body of the stomach. 2. No appreciable change in mild-to-moderate small bowel dilatation, in keeping with persistent distal small bowel obstruction. Electronically Signed   By: Delbert Phenix M.D.   On: 01/18/2015 07:58   Dg Abd 2 Views  01/17/2015  CLINICAL DATA:  SBO; pt states she is feeling better today due to flatulence this morning EXAM: ABDOMEN - 2 VIEW COMPARISON:  01/15/2015 FINDINGS: Upright and supine views. The upright view demonstrates a nasogastric tube looped within the body of the stomach. Multiple leads and wires projecting over the abdomen. Multiple small bowel air-fluid levels. No free intraperitoneal air. The supine image demonstrates small bowel distension, example loop 3.8 cm. Compare 4.0 cm at the same level on the prior. small volume colonic gas. No pneumatosis. IMPRESSION: Similar partial small bowel obstruction. No free intraperitoneal air or other acute complication. Electronically Signed   By: Jeronimo Greaves M.D.   On: 01/17/2015 09:08   Dg Abd 2 Views  01/15/2015  CLINICAL DATA:  81 year old female with abdominal pain and distention for 6 days. EXAM: ABDOMEN - 2 VIEW COMPARISON:  01/13/2015 FINDINGS: Increasing small bowel  distention noted compatible with small bowel obstruction. There is no evidence of pneumoperitoneum. An NG tube with tip overlying the mid stomach is noted. Cholecystectomy clips are present. No acute bony abnormalities are identified. IMPRESSION: Increasing small bowel distention compatible small bowel obstruction. No evidence of pneumoperitoneum. NG tube with tip overlying the mid stomach. Electronically Signed   By: Harmon Pier M.D.   On: 01/15/2015 13:04   Dg Abd 2 Views  01/13/2015  CLINICAL DATA:  Follow-up small bowel obstruction EXAM: ABDOMEN - 2 VIEW COMPARISON:  01/11/2015 FINDINGS: There are distended small bowel loops in right abdomen and left upper abdomen highly suspicious for bowel obstruction. Paucity of bowel gas in left  lower abdomen and pelvis. IMPRESSION: Gaseous distended small bowel loops in right abdomen and upper abdomen highly suspicious for bowel obstruction. Paucity of bowel gas in left abdomen and pelvis Electronically Signed   By: Natasha Mead M.D.   On: 01/13/2015 12:39   Dg Abd Portable 1v  01/19/2015  CLINICAL DATA:  Small bowel obstruction EXAM: PORTABLE ABDOMEN - 1 VIEW COMPARISON:  01/18/2015 FINDINGS: Disproportionally dilated loops of bowel are stable. There is some colonic gas. Stable NG tube in the body of the stomach. IMPRESSION: Stable partial small bowel obstruction pattern. Electronically Signed   By: Jolaine Click M.D.   On: 01/19/2015 09:32   Dg Abd Portable 1v-small Bowel Obstruction Protocol-initial, 8 Hr Delay  01/11/2015  CLINICAL DATA:  81 year old female with small bowel obstruction. Initial encounter. EXAM: PORTABLE ABDOMEN - 1 VIEW COMPARISON:  0831 hr today and earlier. FINDINGS: Portable AP supine view at 2228 hrs. Stable in NG tube looped in the stomach, side hole the level of the gastric body. Stable cholecystectomy clips. Staple lines re- identified in the left upper abdomen. Continued paucity of bowel gas. Still, mildly dilated up to 3-3.5 cm bowel  small bowel loops are evident in the left abdomen. Paucity of colonic gas as before. Stable visualized osseous structures. IMPRESSION: 1. Stable NG tube. 2. Continued paucity of bowel gas, including colon gas. Residual mildly dilated (approximately 3 cm) small bowel loops evident in the left abdomen. Electronically Signed   By: Odessa Fleming M.D.   On: 01/11/2015 23:06   Dg Basil Dess Tube Plc W/fl-no Rad  01/10/2015  CLINICAL DATA:  NASO G TUBE PLACEMENT WITH FLUORO Fluoroscopy was utilized by the requesting physician.  No radiographic interpretation.    Microbiology: Recent Results (from the past 240 hour(s))  Culture, Urine     Status: None   Collection Time: 01/22/15 11:03 AM  Result Value Ref Range Status   Specimen Description URINE, CATHETERIZED  Final   Special Requests NONE  Final   Culture NO GROWTH 1 DAY  Final   Report Status 01/23/2015 FINAL  Final  Culture, blood (routine x 2)     Status: None   Collection Time: 01/22/15 12:14 PM  Result Value Ref Range Status   Specimen Description BLOOD RIGHT ANTECUBITAL  Final   Special Requests BOTTLES DRAWN AEROBIC AND ANAEROBIC 10CC  Final   Culture NO GROWTH 5 DAYS  Final   Report Status 01/27/2015 FINAL  Final  Culture, blood (routine x 2)     Status: None   Collection Time: 01/22/15 12:28 PM  Result Value Ref Range Status   Specimen Description BLOOD BLOOD RIGHT HAND  Final   Special Requests   Final    BOTTLES DRAWN AEROBIC AND ANAEROBIC 10CC BLUE, 5CC RED   Culture NO GROWTH 5 DAYS  Final   Report Status 01/27/2015 FINAL  Final  MRSA PCR Screening     Status: Abnormal   Collection Time: 01/22/15  4:12 PM  Result Value Ref Range Status   MRSA by PCR POSITIVE (A) NEGATIVE Final    Comment:        The GeneXpert MRSA Assay (FDA approved for NASAL specimens only), is one component of a comprehensive MRSA colonization surveillance program. It is not intended to diagnose MRSA infection nor to guide or monitor treatment for MRSA  infections. RESULT CALLED TO, READ BACK BY AND VERIFIED WITH: S.DOTY,RN AT 1832 BY L.PITT 01/22/15   MRSA PCR Screening     Status: Abnormal  Collection Time: 01/28/15  2:55 AM  Result Value Ref Range Status   MRSA by PCR POSITIVE (A) NEGATIVE Final    Comment:        The GeneXpert MRSA Assay (FDA approved for NASAL specimens only), is one component of a comprehensive MRSA colonization surveillance program. It is not intended to diagnose MRSA infection nor to guide or monitor treatment for MRSA infections. RESULT CALLED TO, READ BACK BY AND VERIFIED WITH: LASHEETA @0449  01/28/15 MKELLY      Labs: Basic Metabolic Panel:  Recent Labs Lab 01/28/15 0132 01/28/15 0436 01/28/15 0747 01/28/15 1213 01/29/15 0258  NA 130* 130* 134* 133* 136  K 5.0 3.8 3.7 4.1 4.3  CL 101 102 105 103 107  CO2 21* 21* 21* 20* 22  GLUCOSE 110* 154* 124* 112* 85  BUN 49* 46* 43* 40* 31*  CREATININE 1.05* 1.02* 0.97 0.91 0.86  CALCIUM 8.4* 8.3* 8.5* 9.0 8.7*   Liver Function Tests:  Recent Labs Lab 01/27/15 2100  AST 46*  ALT 52  ALKPHOS 68  BILITOT 0.2*  PROT 6.3*  ALBUMIN 2.6*   No results for input(s): LIPASE, AMYLASE in the last 168 hours. No results for input(s): AMMONIA in the last 168 hours. CBC:  Recent Labs Lab 01/23/15 0522 01/26/15 0625 01/27/15 0410 01/27/15 2100 01/28/15 0747 01/29/15 0258  WBC 9.5 5.5 5.1 7.0 6.5 7.2  NEUTROABS 7.0  --   --   --  4.0  --   HGB 9.0* 8.6* 8.2* 8.8* 7.8* 9.3*  HCT 24.8* 24.5* 23.7* 25.8* 22.6* 27.1*  MCV 91.5 90.4 91.9 91.8 92.6 88.9  PLT 199 265 288 298 286 307   Cardiac Enzymes: No results for input(s): CKTOTAL, CKMB, CKMBINDEX, TROPONINI in the last 168 hours. BNP: BNP (last 3 results)  Recent Labs  05/27/14 1630  BNP 195.7*    ProBNP (last 3 results) No results for input(s): PROBNP in the last 8760 hours.  CBG:  Recent Labs Lab 01/25/15 0315 01/25/15 1540 01/27/15 2102 01/28/15 0627 01/29/15 0518   GLUCAP 156* 124* 112* 131* 80       Signed:  Ambra Haverstick A  [facility] 01/29/2015, 1:19 PM

## 2015-01-29 NOTE — Progress Notes (Signed)
Discharge instructions reviewed with patient and family. All questions answered at this time. Transport home per family.   Ave Filter, RN

## 2015-01-29 NOTE — Progress Notes (Signed)
Spoke with pt and her daughter at the bedside. They were both concerned that the 3 in 1 [facility] that has been ordered may be too large for the house. I suggested they look at it when it is delivered, as it is smaller than those used in the hospital. They can choose not to take it if dissatisfied. They are aware That [facility] will be providing HHPT/HHOT and have already talked with Rep who is to schedule first visit. No other CM needs at present. Will be available for any additional Disposition/Discharge needs. Delrae Sawyers, [place]

## 2015-02-01 ENCOUNTER — Other Ambulatory Visit: Payer: Medicare Other

## 2015-02-02 ENCOUNTER — Encounter: Payer: Self-pay | Admitting: Nurse Practitioner

## 2015-02-02 ENCOUNTER — Ambulatory Visit (INDEPENDENT_AMBULATORY_CARE_PROVIDER_SITE_OTHER): Payer: Medicare Other | Admitting: Nurse Practitioner

## 2015-02-02 VITALS — BP 130/70 | HR 52 | Temp 97.7°F | Resp 20 | Ht 66.0 in | Wt 133.0 lb

## 2015-02-02 DIAGNOSIS — Z9889 Other specified postprocedural states: Secondary | ICD-10-CM | POA: Diagnosis not present

## 2015-02-02 DIAGNOSIS — K5901 Slow transit constipation: Secondary | ICD-10-CM | POA: Diagnosis not present

## 2015-02-02 DIAGNOSIS — D638 Anemia in other chronic diseases classified elsewhere: Secondary | ICD-10-CM | POA: Diagnosis not present

## 2015-02-02 DIAGNOSIS — E039 Hypothyroidism, unspecified: Secondary | ICD-10-CM | POA: Diagnosis not present

## 2015-02-02 DIAGNOSIS — R55 Syncope and collapse: Secondary | ICD-10-CM

## 2015-02-02 DIAGNOSIS — E871 Hypo-osmolality and hyponatremia: Secondary | ICD-10-CM | POA: Diagnosis not present

## 2015-02-02 DIAGNOSIS — I10 Essential (primary) hypertension: Secondary | ICD-10-CM | POA: Diagnosis not present

## 2015-02-02 MED ORDER — HYDRALAZINE HCL 100 MG PO TABS: 100.0000 mg | ORAL_TABLET | Freq: Three times a day (TID) | ORAL | Status: DC

## 2015-02-02 NOTE — Progress Notes (Signed)
Patient ID: Shannon [name], female   DOB: 10/06/1923, 81 y.o.   MRN: [id number]    PCP: Kirt Boys, DO  Advanced Directive information    No Known Allergies  Chief Complaint  Patient presents with  . Hospitalization Follow-up    Patient was seen in the hospital on 01/10/15 and 01/27/15   . Medication Management    Discuss Hydralazine 100 mg TID, patient was told to D/C and she is still taking. Patient would like to discuss Metnax, patient would like to restart.     HPI: Patient is a 81 y.o. female seen in the office today for hospital follow up. Pt with medical history of dyslipidemia, hypertension, small bowel obstruction status post exploratory laparotomy, hypothyroidism, type 2 diabetes mellitus with neuropathy, gastroparesis, chronic constipation, SIADH. Pt went to the ED after  a passing out episode. The patient was recently hospitalized and discharged earlier in the morning of 01/27/2015 after going home was in the shower and suddenly become dizzy and lightheaded and passed out. Pt was also found to have increased weakness. Workup ER showed hyponatremia and appeared to be dehydrated, was hydrated with IV fluids. Pt was also found to have severe constipation which she was given medication in hospital for and had 5 BM prior to discharge. Patient did have sweating and feeling that she going to collapse but she never lost her consciousness.Appeared to be vasovagal, after straining for bowel movement. Pt was discharged on 10/26 after S/P exploratory laparotomy 01/20/15. Incision healing well, follow-up with general surgery as outpatient. Pt was also found to be anemic and give 1 unit PRBC in the hospital. During initial hospitalization was found to have pneumonia. Breathing has improved. Shortness of breath with increase activity. Some cough and congestion but overall improved. No fever.  Pt is currently living her daughter.  Reports she is getting stronger every day. Doing well at home.    Moving bowels good at home.  No abdominal pain No pre-syncopal episodes  Was taken off hydralazine in hospital (during first hospitalization) but conts to take this, blood pressure has been well controlled on this medications.   Review of Systems: Review of Systems  Constitutional: Negative for fever and chills.  HENT: Negative for sore throat and tinnitus.   Respiratory: Positive for shortness of breath (with increase activity). Negative for cough and wheezing.   Cardiovascular: Positive for leg swelling. Negative for chest pain and palpitations.  Gastrointestinal: Negative for nausea, vomiting, abdominal pain, diarrhea, constipation and blood in stool.  Genitourinary: Negative for dysuria, urgency, frequency and hematuria.  Musculoskeletal: Negative for myalgias.  Skin: Negative for rash.  Allergic/Immunologic: Negative for environmental allergies.  Neurological: Negative for dizziness, tremors, seizures, weakness and headaches.  Psychiatric/Behavioral: The patient is not nervous/anxious.     Past Medical History  Diagnosis Date  . Pneumonia   . Hyperlipidemia   . Hypertension   . Small bowel obstruction (HCC)     "I don't know how many" (01/11/2015)  . Ventral hernia with bowel obstruction   . Asthma   . Hyperlipidemia   . Stroke (HCC)     "light one"  . COPD (chronic obstructive pulmonary disease) (HCC)   . Hypothyroidism   . Type II diabetes mellitus (HCC)   . Anemia   . History of blood transfusion   . GERD (gastroesophageal reflux disease)   . History of stomach ulcers   . Arthritis     "all over"  . Renal insufficiency   . Depression     "  light case"  . SIADH (syndrome of inappropriate ADH production) (HCC)     [name] 01/10/2015   Past Surgical History  Procedure Laterality Date  . Tubal ligation    . Colectomy    . Hernia repair  2015  . Esophagogastroduodenoscopy N/A 01/19/2014    Procedure: ESOPHAGOGASTRODUODENOSCOPY (EGD);  Surgeon: Hilarie Fredrickson, MD;   Location: [facility];  Service: Endoscopy;  Laterality: N/A;  . Esophagogastroduodenoscopy N/A 01/20/2014    Procedure: ESOPHAGOGASTRODUODENOSCOPY (EGD);  Surgeon: Hilarie Fredrickson, MD;  Location: [facility];  Service: Endoscopy;  Laterality: N/A;  . Esophagogastroduodenoscopy N/A 03/19/2014    Procedure: ESOPHAGOGASTRODUODENOSCOPY (EGD);  Surgeon: Rachael Fee, MD;  Location: [facility];  Service: Endoscopy;  Laterality: N/A;  . Cholecystectomy open    . Ventral hernia repair  2015    incarcerated ventral hernia ([facility] 09/2013)/notes 01/10/2015  . Cataract extraction w/ intraocular lens  implant, bilateral    . Gastrojejunostomy      hx/notes 01/10/2015  . Laparotomy N/A 01/20/2015    Procedure: EXPLORATORY LAPAROTOMY;  Surgeon: Abigail Miyamoto, MD;  Location: [facility] OR;  Service: General;  Laterality: N/A;  . Lysis of adhesion N/A 01/20/2015    Procedure: LYSIS OF ADHESIONS < 1 HOUR;  Surgeon: Abigail Miyamoto, MD;  Location: [facility] OR;  Service: General;  Laterality: N/A;   Social History:   reports that she has never smoked. She has quit using smokeless tobacco. Her smokeless tobacco use included Snuff. She reports that she does not drink alcohol or use illicit drugs.  Family History  Problem Relation Age of Onset  . Stroke Mother   . Heart attack Neg Hx   . Hypertension Mother   . Diabetes Brother     Medications: Patient's Medications  New Prescriptions   No medications on file  Previous Medications   ACETAMINOPHEN (TYLENOL) 325 MG TABLET    Take 650 mg by mouth every 6 (six) hours as needed for mild pain or fever.   ADVAIR DISKUS 250-50 MCG/DOSE AEPB    INHALE 1 PUFF INTO THE LUNGS 2 (TWO) TIMES DAILY AS NEEDED FOR SHORTNESS OF BREATH   ALBUTEROL (PROVENTIL HFA;VENTOLIN HFA) 108 (90 BASE) MCG/ACT INHALER    Inhale 2 puffs into the lungs every 6 (six) hours as needed for wheezing or shortness of breath.   AMLODIPINE (NORVASC) 10 MG TABLET    Take 1 tablet (10 mg total) by mouth  daily. For HTN   ASPIRIN EC 81 MG TABLET    Take 81 mg by mouth every morning.   ATORVASTATIN (LIPITOR) 10 MG TABLET    TAKE 1 TABLET BY MOUTH EVERY DAY   B COMPLEX VITAMINS TABLET    Take 1 tablet by mouth 2 (two) times daily.   CALCIUM-VITAMIN D (OSCAL WITH D) 500-200 MG-UNIT PER TABLET    Take 1 tablet by mouth daily with breakfast.    CHLORHEXIDINE (PERIDEX) 0.12 % SOLUTION    15 mLs by Mouth Rinse route 2 (two) times daily.   CLONIDINE (CATAPRES) 0.1 MG TABLET    Take 1 tablet (0.1 mg total) by mouth 2 (two) times daily.   FEEDING SUPPLEMENT (BOOST / RESOURCE BREEZE) LIQD    Take 1 Container by mouth 3 (three) times daily between meals.   FLUTICASONE (FLONASE) 50 MCG/ACT NASAL SPRAY    Place 2 sprays into both nostrils daily.   GABAPENTIN (NEURONTIN) 300 MG CAPSULE    Take 1 capsule (300 mg total) by mouth 3 (three) times daily.  ISOSORBIDE MONONITRATE (IMDUR) 30 MG 24 HR TABLET    Take one tablet by mouth once daily. If Chest Pains occur increase to two tablets daily.   LACTULOSE (CHRONULAC) 10 GM/15ML SOLUTION    Take 15 mLs (10 g total) by mouth daily as needed for mild constipation.   LATANOPROST (XALATAN) 0.005 % OPHTHALMIC SOLUTION    Place 1 drop into both eyes at bedtime.   LEVOTHYROXINE (SYNTHROID, LEVOTHROID) 100 MCG TABLET    Take 1 tablet (100 mcg total) by mouth daily. Take with dose (total )   LEVOTHYROXINE (SYNTHROID, LEVOTHROID) 125 MCG TABLET    Take 1 tablet (125 mcg total) by mouth daily before breakfast. Take with dose (total )   METOPROLOL SUCCINATE (TOPROL-XL) 25 MG 24 HR TABLET    Take 1 tablet (25 mg total) by mouth daily.   MULTIPLE VITAMINS-MINERALS (MULTIVITAMIN WITH MINERALS) TABLET    Take 1 tablet by mouth every morning.    PANTOPRAZOLE (PROTONIX) 40 MG TABLET    TAKE 1 TABLET BY MOUTH TWICE A DAY   POLYETHYL GLYCOL-PROPYL GLYCOL 0.4-0.3 % SOLN    Place 1 drop into both eyes 4 (four) times daily.    POLYETHYLENE GLYCOL (MIRALAX /  GLYCOLAX) PACKET    Take 17 g by mouth 2 (two) times daily.   RESTASIS 0.05 % OPHTHALMIC EMULSION    USE 1 DROP INTO BOTH EYES TWICE DAILY   SENNOSIDES-DOCUSATE SODIUM (SENOKOT-S) 8.6-50 MG TABLET    Take 1 tablet by mouth 2 (two) times daily.    SERTRALINE (ZOLOFT) 100 MG TABLET    Take one tablet by mouth once daily for depression   SIMETHICONE (MYLICON) 80 MG CHEWABLE TABLET    Chew 1 tablet (80 mg total) by mouth 4 (four) times daily as needed for flatulence.   SODIUM CHLORIDE 1 G TABLET    TAKE 1 TABLET BY MOUTH THREE TIMES DAILY   TRAMADOL (ULTRAM) 50 MG TABLET    Take by mouth 2 (two) times daily as needed for moderate pain.  Modified Medications   No medications on file  Discontinued Medications   No medications on file     Physical Exam:  Filed Vitals:   02/02/15 1453  Pulse: 52  Temp: 97.7 F (36.5 C)  TempSrc: Oral  Height: 5\' 6"  (1.676 m)  Weight: 133 lb (60.328 kg)   Body mass index is 21.48 kg/(m^2).  Physical Exam  Constitutional: She is oriented to person, place, and time. She appears well-developed and well-nourished. No distress.  HENT:  Head: Normocephalic and atraumatic.  Mouth/Throat: Oropharynx is clear and moist. No oropharyngeal exudate.  Eyes: Conjunctivae are normal. Pupils are equal, round, and reactive to light.  Neck: Normal range of motion. Neck supple.  Cardiovascular: Normal rate, regular rhythm and normal heart sounds.   Pulmonary/Chest: Effort normal and breath sounds normal.  Abdominal: Soft. Bowel sounds are normal.  Musculoskeletal: She exhibits edema (1+ bilaterally, wearing TED hose). She exhibits no tenderness.  Neurological: She is alert and oriented to person, place, and time.  Skin: Skin is warm and dry. She is not diaphoretic.  Psychiatric: She has a normal mood and affect.    Labs reviewed: Basic Metabolic Panel:  Recent Labs  16/10/96 1000  05/28/14 0451  11/02/14 1029  01/21/15 0415 01/22/15 0445 01/22/15 0831   01/28/15 0747 01/28/15 1213 01/29/15 0258  NA  --   < > 121*  < > 137  < > 133* 124* 133*  < >  134* 133* 136  K  --   < > 4.1  < > 6.0*  < > 4.0 5.4* 4.1  < > 3.7 4.1 4.3  CL  --   < > 86*  < > 100  < > 105 98* 106  < > 105 103 107  CO2  --   < > 24  < > 21  < > 21* 20* 21*  < > 21* 20* 22  GLUCOSE  --   < > 125*  < > 94  < > 157* 782* 158*  < > 124* 112* 85  BUN  --   < > 10  < > 31*  < > 19 22* 23*  < > 43* 40* 31*  CREATININE  --   < > 1.01  < > 1.31*  < > 1.17* 1.12* 1.05*  < > 0.97 0.91 0.86  CALCIUM  --   < > 9.3  < > 9.8  < > 8.8* 8.4* 8.7*  < > 8.5* 9.0 8.7*  MG  --   --   --   --   --   < > 1.6* 2.3 1.9  --   --   --   --   PHOS  --   --   --   --   --   < > 3.5 5.8* 3.1  --   --   --   --   TSH 27.660*  --  16.195*  --  0.022*  --   --   --   --   --   --   --   --   < > = values in this interval not displayed. Liver Function Tests:  Recent Labs  01/22/15 0445 01/22/15 0831 01/27/15 2100  AST 32 27 46*  ALT 38 38 52  ALKPHOS 83 93 68  BILITOT 0.4 0.3 0.2*  PROT 4.5* 5.0* 6.3*  ALBUMIN 2.1* 2.3* 2.6*    Recent Labs  03/12/14 1808 05/21/14 1636 01/10/15 0131  LIPASE 49 38 21*   No results for input(s): AMMONIA in the last 8760 hours. CBC:  Recent Labs  01/20/15 0600  01/23/15 0522  01/27/15 2100 01/28/15 0747 01/29/15 0258  WBC 6.7  < > 9.5  < > 7.0 6.5 7.2  NEUTROABS 4.4  --  7.0  --   --  4.0  --   HGB 10.5*  < > 9.0*  < > 8.8* 7.8* 9.3*  HCT 30.6*  < > 24.8*  < > 25.8* 22.6* 27.1*  MCV 92.7  < > 91.5  < > 91.8 92.6 88.9  PLT 256  < > 199  < > 298 286 307  < > = values in this interval not displayed. Lipid Panel:  Recent Labs  11/02/14 1029 01/20/15 0600  CHOL 124  --   HDL 75  --   LDLCALC 33  --   TRIG 82 91  CHOLHDL 1.7  --    TSH:  Recent Labs  03/22/14 1000 05/28/14 0451 11/02/14 1029  TSH 27.660* 16.195* 0.022*   A1C: Lab Results  Component Value Date   HGBA1C 6.4* 11/02/2014     Assessment/Plan 1. Syncope,  unspecified syncope type -resolved, no recurrent episode.  2. Slow transit constipation Bowels moving well, no constipation at this time -to cont current regimen  3. Hyponatremia -cont on sodium chloride 1 gm daily  - follow up Basic metabolic panel  4. S/P exploratory laparotomy 01/20/15 -stable,  without abdominal pain. followed by with surgery today, steristrips applied and she will follow up again in 2 weeks.   5. Anemia of chronic disease -1 unit PRBC given during hospitalization, will follow up - CBC with Differential  6. Hypothyroidism, unspecified hypothyroidism type -synthroid adjusted at last OV, will follow up TSH at this time  7. Essential hypertension Blood pressure stable, pt conts to take hydralazine 100 mg TID, will cont at this time. To cont norvasc, catapres, imdur and toprol  - hydrALAZINE (APRESOLINE) 100 MG tablet; Take 1 tablet (100 mg total) by mouth 3 (three) times daily.  Dispense: 90 tablet; Refill: 3  To follow up in 2 months with Dr Montez Morita, sooner if needed   [place] K. Biagio Borg  [facility] [phone] 8 am - 5 pm) [phone] (after hours)

## 2015-02-02 NOTE — Patient Instructions (Signed)
Cony hydralazine 100 mg three times daily Will follow up lab work  Follow up with Dr Eulas Post in 2 months

## 2015-02-03 ENCOUNTER — Ambulatory Visit: Payer: Medicare Other | Admitting: Internal Medicine

## 2015-02-03 LAB — BASIC METABOLIC PANEL
BUN/Creatinine Ratio: 34 — ABNORMAL HIGH (ref 11–26)
BUN: 40 mg/dL — ABNORMAL HIGH (ref 8–27)
CO2: 20 mmol/L (ref 18–29)
Calcium: 9.2 mg/dL (ref 8.7–10.3)
Chloride: 100 mmol/L (ref 97–106)
Creatinine, Ser: 1.18 mg/dL — ABNORMAL HIGH (ref 0.57–1.00)
GFR calc Af Amer: 47 mL/min/{1.73_m2} — ABNORMAL LOW (ref >59)
GFR calc non Af Amer: 41 mL/min/{1.73_m2} — ABNORMAL LOW (ref >59)
Glucose: 113 mg/dL — ABNORMAL HIGH (ref 65–99)
Potassium: 5.1 mmol/L (ref 3.5–5.2)
Sodium: 136 mmol/L (ref 136–144)

## 2015-02-03 LAB — CBC WITH DIFFERENTIAL/PLATELET
Basophils Absolute: 0.1 10*3/uL (ref 0.0–0.2)
Basos: 1 %
EOS (ABSOLUTE): 0.3 10*3/uL (ref 0.0–0.4)
Eos: 5 %
Hematocrit: 28.5 % — ABNORMAL LOW (ref 34.0–46.6)
Hemoglobin: 9.3 g/dL — ABNORMAL LOW (ref 11.1–15.9)
Immature Grans (Abs): 0 10*3/uL (ref 0.0–0.1)
Immature Granulocytes: 0 %
Lymphocytes Absolute: 1.1 10*3/uL (ref 0.7–3.1)
Lymphs: 15 %
MCH: 31.4 pg (ref 26.6–33.0)
MCHC: 32.6 g/dL (ref 31.5–35.7)
MCV: 96 fL (ref 79–97)
Monocytes Absolute: 0.5 10*3/uL (ref 0.1–0.9)
Monocytes: 8 %
Neutrophils Absolute: 5.1 10*3/uL (ref 1.4–7.0)
Neutrophils: 71 %
Platelets: 404 10*3/uL — ABNORMAL HIGH (ref 150–379)
RBC: 2.96 x10E6/uL — ABNORMAL LOW (ref 3.77–5.28)
RDW: 16.7 % — ABNORMAL HIGH (ref 12.3–15.4)
WBC: 7.1 10*3/uL (ref 3.4–10.8)

## 2015-02-03 LAB — TSH: TSH: 0.619 u[IU]/mL (ref 0.450–4.500)

## 2015-02-05 ENCOUNTER — Ambulatory Visit (INDEPENDENT_AMBULATORY_CARE_PROVIDER_SITE_OTHER): Payer: Medicare Other | Admitting: Podiatry

## 2015-02-05 DIAGNOSIS — M2042 Other hammer toe(s) (acquired), left foot: Secondary | ICD-10-CM

## 2015-02-05 DIAGNOSIS — M2041 Other hammer toe(s) (acquired), right foot: Secondary | ICD-10-CM

## 2015-02-05 DIAGNOSIS — M2141 Flat foot [pes planus] (acquired), right foot: Secondary | ICD-10-CM | POA: Diagnosis not present

## 2015-02-05 DIAGNOSIS — M2142 Flat foot [pes planus] (acquired), left foot: Secondary | ICD-10-CM

## 2015-02-05 DIAGNOSIS — E1149 Type 2 diabetes mellitus with other diabetic neurological complication: Secondary | ICD-10-CM

## 2015-02-05 DIAGNOSIS — L84 Corns and callosities: Secondary | ICD-10-CM

## 2015-02-05 DIAGNOSIS — M2012 Hallux valgus (acquired), left foot: Secondary | ICD-10-CM

## 2015-02-05 DIAGNOSIS — M2011 Hallux valgus (acquired), right foot: Secondary | ICD-10-CM

## 2015-02-05 NOTE — Progress Notes (Signed)
Patient ID: Shannon [name], female   DOB: [birth year], 81 y.o.   MRN: [id number] Patient presents for diabetic shoe pick up, shoes are tried on for good fit.  Patient received 1 Pair Apex [id number] Emmy Tan in women's size 10 wide  and 3 pairs custom molded diabetic inserts.  Verbal and written break in and wear instructions given.  Patient will follow up for scheduled routine care. No new complaints today.

## 2015-02-05 NOTE — Patient Instructions (Signed)
Diabetic Shoe and Insoles Instructions   Congratulations on receiving your new shoes and insoles. In accordance with Medicare regulations, they have been selected from our own inventory or have been special ordered to provide you with the optimum comfort and protection. In order to receive the greatest benefit from this footwear, please follow these suggested guidelines.   Initial use of your diabetic shoes It may take a couple of days for you to feel fully comfortable wearing your new diabetic shoes and insoles. Some people are immediately comfortable wearing them, while others need more time to adjust. Generally, the body does not adapt to change rapidly and you may experience mild aches or fatigue when you first begin to wear your shoes.  Wear these shoes as long as they are comfortable. Try to only wear them indoors until you feel that they are comfortable for outdoor use.  If they bother your feet, TAKE THEM OFF and try them again a few hours later or the next day. Check for any soreness or swelling and notify your doctor immediately if you notice any of these signs. Otherwise, increase wear time as they become more comfortable. If you feel like your shoes are bothersome still after trying these tips, please call our office. Keep in mind, that once the outer sole of the shoe becomes dirty, we can no longer send them back.  The insoles should be changed every 4 months and replaced with a new pair.   Maintenance of your new shoes Your new diabetic shoes can be washed with mild soap and water and air dried. Try to keep away from high heat sources (heaters, dryers, fireplaces, etc.) as this may damage or distort the plastazote lining and insert in the shoe. Do NOT machine wash or use harsh chemicals such as bleach on your shoes. These shoes, if properly taken care of, should last one year.   Continuing Care Diabetic patients have fewer foot complications if they have been properly fitted in the  correct type of footwear and accommodating insoles. The desired outcome is to fit you with the most appropriate, best fitting shoe possible in order to reduce the risk of and prevent foot complications, which could ultimately lead to ulceration, infection and amputation. Medicare will pay for one pair of extra depth shoes and 3 pairs of insoles per calendar year.   REMEMBER TO SEE YOUR PODIATRIST FOR REGULAR FOOT EXAMS AT LEAST ONCE PER YEAR.    

## 2015-02-11 ENCOUNTER — Other Ambulatory Visit: Payer: Self-pay | Admitting: *Deleted

## 2015-02-11 MED ORDER — POLYETHYLENE GLYCOL 3350 17 G PO PACK: PACK | ORAL | Status: DC

## 2015-02-11 NOTE — Telephone Encounter (Signed)
[facility] 

## 2015-02-13 ENCOUNTER — Other Ambulatory Visit: Payer: Self-pay | Admitting: Internal Medicine

## 2015-02-23 ENCOUNTER — Encounter: Payer: Self-pay | Admitting: Podiatry

## 2015-02-23 ENCOUNTER — Ambulatory Visit (INDEPENDENT_AMBULATORY_CARE_PROVIDER_SITE_OTHER): Payer: Medicare Other | Admitting: Podiatry

## 2015-02-23 DIAGNOSIS — E1149 Type 2 diabetes mellitus with other diabetic neurological complication: Secondary | ICD-10-CM

## 2015-02-23 DIAGNOSIS — M79676 Pain in unspecified toe(s): Secondary | ICD-10-CM

## 2015-02-23 DIAGNOSIS — B351 Tinea unguium: Secondary | ICD-10-CM | POA: Diagnosis not present

## 2015-02-23 MED ORDER — DICLOFENAC SODIUM 1 % TD GEL: TRANSDERMAL | Status: DC

## 2015-03-01 NOTE — Progress Notes (Signed)
Patient ID: Shannon [name], female   DOB: 1923-09-27, 81 y.o.   MRN: [id number]  Subjective: 81 y.o. returns the office today for painful, elongated, thickened toenails which she cannot trim herself. Denies any redness or drainage around the nails. Denies any acute changes since last appointment and no new complaints today. Denies any systemic complaints such as fevers, chills, nausea, vomiting.   Objective: AAO 3, NAD DP/PT pulses decreased, CRT less than 3 seconds Protective sensation decreased with Simms Weinstein monofilament Nails hypertrophic, dystrophic, elongated, brittle, discolored 10. There is tenderness overlying the nails 1-5 bilaterally. There is no surrounding erythema or drainage along the nail sites. No open lesions or pre-ulcerative lesions are identified. The right third toe discontinue be somewhat hyperpigmented community remaining digits although this is been unchanged. There is no pain to the toe. No other areas of tenderness bilateral lower extremities. No overlying edema, erythema, increased warmth. No pain with calf compression, swelling, warmth, erythema.  Assessment: Patient presents with symptomatic onychomycosis  Plan: -Treatment options including alternatives, risks, complications were discussed -Nails sharply debrided 10 without complication/bleeding. -Previous vascular studies were reviewed with the patient. She states that they did discuss possible surgical intervention today with her blood flow that she does not wish to proceed with any surgery at this time for this issue. -Discussed daily foot inspection. If there are any changes, to call the office immediately.  -Follow-up in 3 months or sooner if any problems are to arise. In the meantime, encouraged to call the office with any questions, concerns, changes symptoms.  Arterial studies: ABI on the L 0.91 with TBI 0.6 and on the right ABI 0.99 and TBI 0.62 CW waveforms suggest mild bilateral run-off  disease. Right ABI is in the normal range, and left is in the mild range. All ten toe PPG waveforms are normal. Great toe pressures are abnormal.  Celesta Gentile, DPM

## 2015-03-12 ENCOUNTER — Other Ambulatory Visit: Payer: Self-pay | Admitting: Internal Medicine

## 2015-03-12 ENCOUNTER — Other Ambulatory Visit: Payer: Self-pay | Admitting: *Deleted

## 2015-03-12 DIAGNOSIS — Z961 Presence of intraocular lens: Secondary | ICD-10-CM | POA: Diagnosis not present

## 2015-03-12 DIAGNOSIS — H527 Unspecified disorder of refraction: Secondary | ICD-10-CM | POA: Diagnosis not present

## 2015-03-12 DIAGNOSIS — H401130 Primary open-angle glaucoma, bilateral, stage unspecified: Secondary | ICD-10-CM | POA: Diagnosis not present

## 2015-03-12 DIAGNOSIS — H04123 Dry eye syndrome of bilateral lacrimal glands: Secondary | ICD-10-CM | POA: Diagnosis not present

## 2015-03-12 MED ORDER — POLYETHYLENE GLYCOL 3350 17 GM/SCOOP PO POWD: 17.0000 g | Freq: Two times a day (BID) | ORAL | Status: DC | PRN

## 2015-03-12 NOTE — Telephone Encounter (Signed)
Patient daughter [name] called and stated that patient has been getting the bottle of Miralax and this last time she received packets and she doesn't want them. Wants the bottle instead. Refaxed to pharmacy.

## 2015-03-16 ENCOUNTER — Other Ambulatory Visit: Payer: Self-pay | Admitting: Internal Medicine

## 2015-03-18 ENCOUNTER — Encounter: Payer: Self-pay | Admitting: Cardiology

## 2015-03-18 ENCOUNTER — Ambulatory Visit (INDEPENDENT_AMBULATORY_CARE_PROVIDER_SITE_OTHER): Payer: Medicare Other | Admitting: Cardiology

## 2015-03-18 VITALS — BP 148/76 | HR 72 | Ht 66.0 in | Wt 137.0 lb

## 2015-03-18 DIAGNOSIS — R600 Localized edema: Secondary | ICD-10-CM

## 2015-03-18 DIAGNOSIS — I248 Other forms of acute ischemic heart disease: Secondary | ICD-10-CM

## 2015-03-18 DIAGNOSIS — I119 Hypertensive heart disease without heart failure: Secondary | ICD-10-CM | POA: Diagnosis not present

## 2015-03-18 DIAGNOSIS — I2510 Atherosclerotic heart disease of native coronary artery without angina pectoris: Secondary | ICD-10-CM

## 2015-03-18 DIAGNOSIS — I2583 Coronary atherosclerosis due to lipid rich plaque: Secondary | ICD-10-CM

## 2015-03-18 NOTE — Patient Instructions (Signed)
Your physician recommends that you continue on your current medications as directed. Please refer to the Current Medication list given to you today.     Your physician wants you to follow-up in: 6 MONTHS WITH DR NELSON You will receive a reminder letter in the mail two months in advance. If you don't receive a letter, please call our office to schedule the follow-up appointment.  

## 2015-03-18 NOTE — Progress Notes (Signed)
Patient ID: Shannon [name], female   DOB: October 30, 1923, 81 y.o.   MRN: [id number] Patient ID: Shannon [name], female   DOB: Sep 11, 1923, 81 y.o.   MRN: [id number]    Patient Name: Shannon [name] Date of Encounter: 03/18/2015  Primary Care Provider:  Kirt Boys, DO Primary Cardiologist:  Lars Masson  Problem List   Past Medical History  Diagnosis Date  . Pneumonia   . Hyperlipidemia   . Hypertension   . Small bowel obstruction (HCC)     "I don't know how many" (01/11/2015)  . Ventral hernia with bowel obstruction   . Asthma   . Hyperlipidemia   . Stroke (HCC)     "light one"  . COPD (chronic obstructive pulmonary disease) (HCC)   . Hypothyroidism   . Type II diabetes mellitus (HCC)   . Anemia   . History of blood transfusion   . GERD (gastroesophageal reflux disease)   . History of stomach ulcers   . Arthritis     "all over"  . Renal insufficiency   . Depression     "light case"  . SIADH (syndrome of inappropriate ADH production) (HCC)     [name] 01/10/2015   Past Surgical History  Procedure Laterality Date  . Tubal ligation    . Colectomy    . Hernia repair  2015  . Esophagogastroduodenoscopy N/A 01/19/2014    Procedure: ESOPHAGOGASTRODUODENOSCOPY (EGD);  Surgeon: Hilarie Fredrickson, MD;  Location: [facility];  Service: Endoscopy;  Laterality: N/A;  . Esophagogastroduodenoscopy N/A 01/20/2014    Procedure: ESOPHAGOGASTRODUODENOSCOPY (EGD);  Surgeon: Hilarie Fredrickson, MD;  Location: [facility];  Service: Endoscopy;  Laterality: N/A;  . Esophagogastroduodenoscopy N/A 03/19/2014    Procedure: ESOPHAGOGASTRODUODENOSCOPY (EGD);  Surgeon: Rachael Fee, MD;  Location: [facility];  Service: Endoscopy;  Laterality: N/A;  . Cholecystectomy open    . Ventral hernia repair  2015    incarcerated ventral hernia ([facility] 09/2013)/notes 01/10/2015  . Cataract extraction w/ intraocular lens  implant, bilateral    . Gastrojejunostomy      hx/notes 01/10/2015  . Laparotomy N/A 01/20/2015   Procedure: EXPLORATORY LAPAROTOMY;  Surgeon: Abigail Miyamoto, MD;  Location: [facility] OR;  Service: General;  Laterality: N/A;  . Lysis of adhesion N/A 01/20/2015    Procedure: LYSIS OF ADHESIONS < 1 HOUR;  Surgeon: Abigail Miyamoto, MD;  Location: [facility] OR;  Service: General;  Laterality: N/A;   Allergies  No Known Allergies  Chief complain: Hypertension  HPI  81 year old female who is coming for post-hospitalization follow up, she has h/o diabetes, chronic kidney disease stage III with recent exploratory laparotomy with EGD and resection of gastro-jejunal anastomosis, with current difficult to control hypertension and chest pain. Troponin elevated in the hospital in the settings of BP in the 190-200 systolic range. She has been transferred to the ICU. Metoprolol 5 mg IV has been administered, clonidine 0.3 he has been administered. Hydralazine 10 mg IV has been administered. We were asked for suggestions on reducing blood pressure. Cardiology was consulted and it was believed that troponin elevation is sec to demand ischemia in the settings of hypertensive emergency. She also has h/o pre-syncope, MRI/MRA head neck showed chronic ischemic changes, no acute stroke and ? Left ICA stenosis, follow up US showed onlt 1-39% stenosis B/L.  02/18/2014 - The patient is coming for a follow up, she complains of overall fatigue and DOE, no chest pain. BP at home in the 150-160 range.   04/08/2013 - the patient was hospitalized  at the end on December for another episode of SBO and hyponatremia. She is now in rehab. She had a negative stress test at the end of November with normal heart function and no prior scar or ischemia. She feels ok, complains of cough when she lays down at night. No SOB or chest pain.  02/16/2015 - the patient is doing great and feeling better since the last visit, her BP usually runs in 130-140 range but occasionally gets to 160-170. She denies CP, she has stable DOE, no falls. NO orthopnea or  PND, no LE edema.   Home Medications  Prior to Admission medications   Medication Sig Start Date End Date Taking? Authorizing Provider  acetaminophen (TYLENOL EX ST ARTHRITIS PAIN) 500 MG tablet Take 500 mg by mouth every 6 (six) hours as needed (for arthritis pain).   Yes Historical Provider, MD  antiseptic oral rinse (CPC / CETYLPYRIDINIUM CHLORIDE 0.05%) 0.05 % LIQD solution 7 mLs by Mouth Rinse route 2 times daily at 12 noon and 4 pm. 01/22/14  Yes Alison Murray, MD  aspirin EC 81 MG tablet Take 1 tablet (81 mg total) by mouth daily. 12/26/13  Yes Rodolph Bong, MD  atorvastatin (LIPITOR) 10 MG tablet Take 10 mg by mouth daily at 6 PM.   Yes Historical Provider, MD  Calcium Carbonate (CALTRATE 600 PO) Take 2 capsules by mouth 2 (two) times daily.   Yes Historical Provider, MD  chlorhexidine (PERIDEX) 0.12 % solution 15 mLs by Mouth Rinse route 2 (two) times daily. 01/22/14  Yes Alison Murray, MD  cycloSPORINE (RESTASIS) 0.05 % ophthalmic emulsion Place 1 drop into both eyes 2 (two) times daily.   Yes Historical Provider, MD  Dietary Management Product (VASCULERA) TABS Take 1 tablet by mouth daily.   Yes Historical Provider, MD  fluticasone (FLONASE) 50 MCG/ACT nasal spray Place 2 sprays into both nostrils daily.   Yes Historical Provider, MD  gabapentin (NEURONTIN) 300 MG capsule Take 300 mg by mouth 3 (three) times daily.   Yes Historical Provider, MD  hydrALAZINE (APRESOLINE) 50 MG tablet Take one and half tablet by mouth three times daily to control blood pressure 02/06/14  Yes Tiffany L Reed, DO  isosorbide mononitrate (IMDUR) 30 MG 24 hr tablet Take 30 mg by mouth daily.   Yes Historical Provider, MD  l-methylfolate-B6-B12 Latrelle Dodrill) 3-35-2 MG TABS Take one tablet by mouth once daily 01/09/14  Yes Mahima Pandey, MD  levothyroxine (SYNTHROID, LEVOTHROID) 175 MCG tablet Take 175 mcg by mouth daily before breakfast.   Yes Historical Provider, MD  losartan (COZAAR) 100 MG tablet Take 100 mg  by mouth daily.   Yes Historical Provider, MD  magnesium oxide (MAG-OX) 400 (241.3 MG) MG tablet TAKE ONE TABLET BY MOUTH TWICE DAILY 02/10/14  Yes Mahima Pandey, MD  metoprolol succinate (TOPROL-XL) 50 MG 24 hr tablet Take 50 mg by mouth daily. Take with or immediately following a meal.   Yes Historical Provider, MD  Multiple Vitamins-Minerals (MULTIVITAMIN WITH MINERALS) tablet Take 1 tablet by mouth daily.   Yes Historical Provider, MD  pantoprazole (PROTONIX) 40 MG tablet Take 40 mg by mouth 2 (two) times daily before a meal. 02/03/14  Yes Historical Provider, MD  polyethylene glycol powder (GLYCOLAX/MIRALAX) powder Take 17 g by mouth 2 (two) times daily as needed. 02/03/14  Yes Mahima Glade Lloyd, MD  sennosides-docusate sodium (SENOKOT-S) 8.6-50 MG tablet Take 1 tablet by mouth 2 (two) times daily.    Yes Historical Provider, MD  sertraline (ZOLOFT) 100 MG tablet Take 100 mg by mouth daily.   Yes Historical Provider, MD  sodium chloride 1 G tablet Take 1 g by mouth 2 (two) times daily with a meal.  02/05/14  Yes Historical Provider, MD  sucralfate (CARAFATE) 1 G tablet Take 1 g by mouth 3 (three) times daily with meals.   Yes Historical Provider, MD  traMADol (ULTRAM) 50 MG tablet Take 1 tablet (50 mg total) by mouth every 12 (twelve) hours as needed for severe pain (pain). 01/22/14  Yes Alison Murray, MD   Family History  Family History  Problem Relation Age of Onset  . Stroke Mother   . Heart attack Neg Hx   . Hypertension Mother   . Diabetes Brother     Social History  Social History   Social History  . Marital Status: Widowed    Spouse Name: N/A  . Number of Children: N/A  . Years of Education: N/A   Occupational History  . Not on file.   Social History Main Topics  . Smoking status: Never Smoker   . Smokeless tobacco: Former Neurosurgeon    Types: Snuff  . Alcohol Use: No  . Drug Use: No  . Sexual Activity: No   Other Topics Concern  . Not on file   Social History Narrative     Married in [place], lives in one story house with 2 people and no pets   Occupation: ?   Has a living Will, [place], and doesn't have DNR   Was living with Husband (in frail health) in [place] until 09/2013.  After her [facility] admission they both came to live with daughter in [place]     Review of Systems, as per HPI, otherwise negative General:  No chills, fever, night sweats or weight changes.  Cardiovascular:  No chest pain, dyspnea on exertion, edema, orthopnea, palpitations, paroxysmal nocturnal dyspnea. Dermatological: No rash, lesions/masses Respiratory: No cough, dyspnea Urologic: No hematuria, dysuria Abdominal:   No nausea, vomiting, diarrhea, bright red blood per rectum, melena, or hematemesis Neurologic:  No visual changes, wkns, changes in mental status. All other systems reviewed and are otherwise negative except as noted above.  Physical Exam  Blood pressure 148/76, pulse 72, height 5\' 6"  (1.676 m), weight 137 lb (62.143 kg).  General: Pleasant, NAD Psych: Normal affect. Neuro: Alert and oriented X 3. Moves all extremities spontaneously. HEENT: Normal  Thorax: square hyperpigmentations post clonidine patches Neck: Supple without bruits or JVD. Lungs:  Resp regular and unlabored, CTA. Heart: RRR no s3, s4, or murmurs. Abdomen: Soft, non-tender, non-distended, BS + x 4.  Extremities: No clubbing, cyanosis, mild B/L LE edema. DP/PT/Radials 2+ and equal bilaterally.  Labs:  No results for input(s): CKTOTAL, CKMB, TROPONINI in the last 72 hours. Lab Results  Component Value Date   WBC 7.1 02/02/2015   HGB 9.3* 01/29/2015   HCT 28.5* 02/02/2015   MCV 88.9 01/29/2015   PLT 307 01/29/2015    No results found for: DDIMER Invalid input(s): POCBNP    Component Value Date/Time   NA 136 02/02/2015 1534   NA 136 01/29/2015 0258   K 5.1 02/02/2015 1534   CL 100 02/02/2015 1534   CO2 20 02/02/2015 1534   GLUCOSE 113* 02/02/2015 1534   GLUCOSE 85 01/29/2015 0258    BUN 40* 02/02/2015 1534   BUN 31* 01/29/2015 0258   CREATININE 1.18* 02/02/2015 1534   CREATININE 1.3* 05/06/2014   CALCIUM 9.2 02/02/2015 1534  PROT 6.3* 01/27/2015 2100   PROT 6.8 11/02/2014 1029   ALBUMIN 2.6* 01/27/2015 2100   ALBUMIN 4.1 11/02/2014 1029   AST 46* 01/27/2015 2100   ALT 52 01/27/2015 2100   ALKPHOS 68 01/27/2015 2100   BILITOT 0.2* 01/27/2015 2100   BILITOT 0.3 11/02/2014 1029   GFRNONAA 41* 02/02/2015 1534   GFRAA 47* 02/02/2015 1534   Lab Results  Component Value Date   CHOL 124 11/02/2014   HDL 75 11/02/2014   LDLCALC 33 11/02/2014   TRIG 91 01/20/2015    Accessory Clinical Findings  Echocardiogram -12/24/2013 Left ventricle: The cavity size was mildly dilated. Wall thickness was increased in a pattern of mild LVH. Systolic function was normal. The estimated ejection fraction was in the range of 55% to 60%. - Aortic valve: There was mild to moderate regurgitation. - Mitral valve: There was mild regurgitation. - Left atrium: The atrium was moderately dilated. - Right atrium: The atrium was mildly dilated. - Atrial septum: Mobile and redundant with no obvious PFO.  ECG - NSR, normal ECG  Sono carotids: 12/25/2013 Summary: Bilateral: mild mixed plaque CCA. Moderate calcific plaque origin and proximal ICA and ECA. 1-39% ICA stenosis. Vertebral artery flow is antegrade. ICA/CCA ratio: R-1.2 L-1.21.  Other specific details can be found in the table(s) above. Prepared and Electronically Authenticated by  Leonides Sake, MD 2015-09-24T16:13:21        Assessment & Plan  81 year old with bezoar, post op hypertension, transient chest pain, elevated troponin/demand ischemia in the setting of hypertensive emergency.   Principal Problem:  Gastric outlet obstruction Active Problems:  Essential hypertension, benign  GERD (gastroesophageal reflux disease)  Dyslipidemia  DM (diabetes mellitus) type II uncontrolled, periph vascular  disorder  Diabetic neuropathy  Hypothyroidism  Depression  Anemia of chronic disease  CKD (chronic kidney disease) stage 3, GFR 30-59 ml/min  Gastric bezoar  1) Demand ischemia - during her recent episode of hypertension, troponin isolated mildly elevated 0.34 with subsequent normal troponin. However  The patient reports fatigue and DOE. Lexiscan nuclear stress test to ruled out ischemia, prior MI, normal LVEF.   2) Hypertensive heart disease - improved hypotension after decreasing imdur from 60 to 30 mg po daily, now occasionally hypertensive, goal is 130-140 at her age, daughter advised to add extra imdur of 30 mg po daily on days when BP > 160 mmHg.   3) Abnormal MRI, the patient and her daughter have a lot of questions regarding discrepancy between MRI and carotid US that didn't show any significant stenosis. She wants to follow with a neurologist for chronic ischemic changes.   4. LE edema - mild, low BP and h/o recent hyponatremia, no diuretics.  Follow up in 6 month.  Lars Masson, MD, [facility] 03/18/2015, 10:25 AM

## 2015-03-29 ENCOUNTER — Other Ambulatory Visit: Payer: Self-pay | Admitting: Internal Medicine

## 2015-04-07 ENCOUNTER — Encounter: Payer: Self-pay | Admitting: Internal Medicine

## 2015-04-07 ENCOUNTER — Ambulatory Visit (INDEPENDENT_AMBULATORY_CARE_PROVIDER_SITE_OTHER): Payer: Medicare Other | Admitting: Internal Medicine

## 2015-04-07 VITALS — BP 130/66 | HR 76 | Temp 97.7°F | Resp 20 | Ht 66.0 in | Wt 134.4 lb

## 2015-04-07 DIAGNOSIS — E039 Hypothyroidism, unspecified: Secondary | ICD-10-CM | POA: Diagnosis not present

## 2015-04-07 DIAGNOSIS — I10 Essential (primary) hypertension: Secondary | ICD-10-CM | POA: Diagnosis not present

## 2015-04-07 DIAGNOSIS — E2839 Other primary ovarian failure: Secondary | ICD-10-CM

## 2015-04-07 DIAGNOSIS — Z780 Asymptomatic menopausal state: Secondary | ICD-10-CM | POA: Diagnosis not present

## 2015-04-07 DIAGNOSIS — E1142 Type 2 diabetes mellitus with diabetic polyneuropathy: Secondary | ICD-10-CM

## 2015-04-07 DIAGNOSIS — R634 Abnormal weight loss: Secondary | ICD-10-CM

## 2015-04-07 DIAGNOSIS — K5901 Slow transit constipation: Secondary | ICD-10-CM | POA: Diagnosis not present

## 2015-04-07 MED ORDER — SIMETHICONE 80 MG PO CHEW: 80.0000 mg | CHEWABLE_TABLET | Freq: Four times a day (QID) | ORAL | Status: DC | PRN

## 2015-04-07 NOTE — Patient Instructions (Signed)
Encourage to eat 3 meals per day with healthy snack in between  May drink ensure or boost at least 2 daily  Continue current medications as ordered  Will call with DXA appt  Will call with nutrition referral  Follow up in 2 mos for routine visit. Will call with lab results

## 2015-04-07 NOTE — Progress Notes (Signed)
Patient ID: Shannon [name], female   DOB: December 04, 1923, 82 y.o.   MRN: [id number]    Location:    [facility]   Place of Service:  OFFICE   Chief Complaint  Patient presents with  . Medical Management of Chronic Issues    2 month follow-up for medication management  . OTHER    Daughter in room with patient    HPI:  82 yo female seen today for f/u. She c/o neck pain and stiffness x several weeks. Takes Tylenol ES which helps a little. She has not tried tramadol yet  She has seasonal allergy sx's and takes zyrtec daily and has flonase. She takes advair for asthma  She does not take any medications for diabetes. She has numbness or tingling in feet. She would like to see podiatry due to foot deformities. She has been going to [facility] for foot care.  No seizures since last OV. Takes gabapentin  BP controlled on meds. Cholesterol stable on lipitor  Thyroid stable on levothyroxine  GI told her to f/u prn. She is still doing pureed diet. No dysphagia or dyspesia. Takes PPI BID. Constipation stable on lactulose  Past Medical History  Diagnosis Date  . Pneumonia   . Hyperlipidemia   . Hypertension   . Small bowel obstruction (HCC)     "I don't know how many" (01/11/2015)  . Ventral hernia with bowel obstruction   . Asthma   . Hyperlipidemia   . Stroke (HCC)     "light one"  . COPD (chronic obstructive pulmonary disease) (HCC)   . Hypothyroidism   . Type II diabetes mellitus (HCC)   . Anemia   . History of blood transfusion   . GERD (gastroesophageal reflux disease)   . History of stomach ulcers   . Arthritis     "all over"  . Renal insufficiency   . Depression     "light case"  . SIADH (syndrome of inappropriate ADH production) (HCC)     [name] 01/10/2015    Past Surgical History  Procedure Laterality Date  . Tubal ligation    . Colectomy    . Hernia repair  2015  . Esophagogastroduodenoscopy N/A 01/19/2014    Procedure: ESOPHAGOGASTRODUODENOSCOPY (EGD);  Surgeon: Hilarie Fredrickson, MD;  Location: [facility];  Service: Endoscopy;  Laterality: N/A;  . Esophagogastroduodenoscopy N/A 01/20/2014    Procedure: ESOPHAGOGASTRODUODENOSCOPY (EGD);  Surgeon: Hilarie Fredrickson, MD;  Location: [facility];  Service: Endoscopy;  Laterality: N/A;  . Esophagogastroduodenoscopy N/A 03/19/2014    Procedure: ESOPHAGOGASTRODUODENOSCOPY (EGD);  Surgeon: Rachael Fee, MD;  Location: [facility];  Service: Endoscopy;  Laterality: N/A;  . Cholecystectomy open    . Ventral hernia repair  2015    incarcerated ventral hernia ([facility] 09/2013)/notes 01/10/2015  . Cataract extraction w/ intraocular lens  implant, bilateral    . Gastrojejunostomy      hx/notes 01/10/2015  . Laparotomy N/A 01/20/2015    Procedure: EXPLORATORY LAPAROTOMY;  Surgeon: Abigail Miyamoto, MD;  Location: [facility] OR;  Service: General;  Laterality: N/A;  . Lysis of adhesion N/A 01/20/2015    Procedure: LYSIS OF ADHESIONS < 1 HOUR;  Surgeon: Abigail Miyamoto, MD;  Location: [facility] OR;  Service: General;  Laterality: N/A;    Patient Care Team: Kirt Boys, DO as PCP - General (Internal Medicine) Jason Coop, MD as Referring Physician (Cardiology) Carole Binning, MD as Referring Physician (Ophthalmology)  Social History   Social History  . Marital Status: Widowed  Spouse Name: N/A  . Number of Children: N/A  . Years of Education: N/A   Occupational History  . Not on file.   Social History Main Topics  . Smoking status: Never Smoker   . Smokeless tobacco: Former Neurosurgeon    Types: Snuff  . Alcohol Use: No  . Drug Use: No  . Sexual Activity: No   Other Topics Concern  . Not on file   Social History Narrative   Married in [place], lives in one story house with 2 people and no pets   Occupation: ?   Has a living Will, [place], and doesn't have DNR   Was living with Husband (in frail health) in [place] until 09/2013.  After her [facility] admission they both came to live with daughter in [place]      reports that she has never smoked. She has quit using smokeless tobacco. Her smokeless tobacco use included Snuff. She reports that she does not drink alcohol or use illicit drugs.  No Known Allergies  Medications: Patient's Medications  New Prescriptions   No medications on file  Previous Medications   ACETAMINOPHEN (TYLENOL) 325 MG TABLET    Take 650 mg by mouth every 6 (six) hours as needed for mild pain or fever.   ADVAIR DISKUS 250-50 MCG/DOSE AEPB    INHALE 1 PUFF INTO THE LUNGS 2 (TWO) TIMES DAILY AS NEEDED FOR SHORTNESS OF BREATH   ALBUTEROL (PROVENTIL HFA;VENTOLIN HFA) 108 (90 BASE) MCG/ACT INHALER    Inhale 2 puffs into the lungs every 6 (six) hours as needed for wheezing or shortness of breath.   AMLODIPINE (NORVASC) 10 MG TABLET    TAKE 1 TABLET BY MOUTH DAILY FOR HYPERTENSION   ASPIRIN EC 81 MG TABLET    Take 81 mg by mouth every morning.   ATORVASTATIN (LIPITOR) 10 MG TABLET    TAKE 1 TABLET BY MOUTH EVERY DAY   B COMPLEX VITAMINS TABLET    Take 1 tablet by mouth 2 (two) times daily.   CALCIUM-VITAMIN D (OSCAL WITH D) 500-200 MG-UNIT PER TABLET    Take 1 tablet by mouth daily with breakfast.    CHLORHEXIDINE (PERIDEX) 0.12 % SOLUTION    15 mLs by Mouth Rinse route 2 (two) times daily.   CLONIDINE (CATAPRES) 0.1 MG TABLET    Take 1 tablet (0.1 mg total) by mouth 2 (two) times daily.   DICLOFENAC SODIUM (VOLTAREN) 1 % GEL    Apply 2 g topically daily. 2 times daily on the area.   FEEDING SUPPLEMENT (BOOST / RESOURCE BREEZE) LIQD    Take 1 Container by mouth 3 (three) times daily between meals.   FLUTICASONE (FLONASE) 50 MCG/ACT NASAL SPRAY    Place 2 sprays into both nostrils daily.   GABAPENTIN (NEURONTIN) 300 MG CAPSULE    TAKE 1 CAPSULE (300 MG TOTAL) BY MOUTH 3 (THREE) TIMES DAILY.   HYDRALAZINE (APRESOLINE) 100 MG TABLET    Take 1 tablet (100 mg total) by mouth 3 (three) times daily.   ISOSORBIDE MONONITRATE (IMDUR) 30 MG 24 HR TABLET    Take one tablet by mouth once daily.  If Chest Pains occur increase to two tablets daily.   LACTULOSE (CHRONULAC) 10 GM/15ML SOLUTION    Take 15 mLs (10 g total) by mouth daily as needed for mild constipation.   LATANOPROST (XALATAN) 0.005 % OPHTHALMIC SOLUTION    Place 1 drop into both eyes at bedtime.   LEVOTHYROXINE (SYNTHROID, LEVOTHROID) 100 MCG TABLET  TAKE 1 TABLET (100 MCG TOTAL) BY MOUTH DAILY. TAKE WITH DOSE (TOTAL )   LEVOTHYROXINE (SYNTHROID, LEVOTHROID) 125 MCG TABLET    Take 1 tablet (125 mcg total) by mouth daily before breakfast. Take with dose (total )   METOPROLOL SUCCINATE (TOPROL-XL) 25 MG 24 HR TABLET    TAKE 1 TABLET (25 MG TOTAL) BY MOUTH DAILY.   MULTIPLE VITAMINS-MINERALS (MULTIVITAMIN WITH MINERALS) TABLET    Take 1 tablet by mouth every morning.    PANTOPRAZOLE (PROTONIX) 40 MG TABLET    TAKE 1 TABLET BY MOUTH TWICE A DAY   POLYETHYL GLYCOL-PROPYL GLYCOL 0.4-0.3 % SOLN    Place 1 drop into both eyes 4 (four) times daily.    POLYETHYLENE GLYCOL (MIRALAX / GLYCOLAX) PACKET    TAKE 17GM BY MOUTH TWICE DAILY FOR CONSTIPATION   POLYETHYLENE GLYCOL POWDER (GLYCOLAX/MIRALAX) POWDER    Take 17 g by mouth 2 (two) times daily as needed.   RESTASIS 0.05 % OPHTHALMIC EMULSION    USE 1 DROP INTO BOTH EYES TWICE DAILY   SENNOSIDES-DOCUSATE SODIUM (SENOKOT-S) 8.6-50 MG TABLET    Take 1 tablet by mouth 2 (two) times daily.    SERTRALINE (ZOLOFT) 100 MG TABLET    TAKE ONE TABLET BY MOUTH ONCE DAILY FOR DEPRESSION   SIMETHICONE (MYLICON) 80 MG CHEWABLE TABLET    Chew 1 tablet (80 mg total) by mouth 4 (four) times daily as needed for flatulence.   SODIUM CHLORIDE 1 G TABLET    TAKE 1 TABLET BY MOUTH THREE TIMES DAILY   TRAMADOL (ULTRAM) 50 MG TABLET    Take by mouth 2 (two) times daily as needed for moderate pain.  Modified Medications   No medications on file  Discontinued Medications   No medications on file    Review of Systems  Filed Vitals:   04/07/15 0913  BP: 130/66  Pulse: 76    Temp: 97.7 F (36.5 C)  TempSrc: Oral  Resp: 20  Height: 5\' 6"  (1.676 m)  Weight: 134 lb 6.4 oz (60.963 kg)  SpO2: 98%   Body mass index is 21.7 kg/(m^2).  Physical Exam  Constitutional: She is oriented to person, place, and time. She appears well-developed. No distress.  Frail appearing in NAD. Uses rolling walker  HENT:  Mouth/Throat: Oropharynx is clear and moist. No oropharyngeal exudate.  Eyes: Pupils are equal, round, and reactive to light. No scleral icterus.  Neck: Neck supple. Carotid bruit is not present. No tracheal deviation present. No thyromegaly present.  Cardiovascular: Normal rate, regular rhythm and intact distal pulses.  Exam reveals no gallop and no friction rub.   Murmur (1/6 SEM) heard. +1 pitting LE edema b/l. No calf TTP  Pulmonary/Chest: Effort normal and breath sounds normal. No stridor. No respiratory distress. She has no wheezes. She has no rales.  Abdominal: Soft. Bowel sounds are normal. She exhibits no distension and no mass. There is no hepatomegaly. There is no tenderness. There is no rebound and no guarding.  Musculoskeletal: She exhibits edema and tenderness.  Gait antalgic.   Lymphadenopathy:    She has no cervical adenopathy.  Neurological: She is alert and oriented to person, place, and time.  Skin: Skin is warm and dry. No rash noted.  Psychiatric: She has a normal mood and affect. Her behavior is normal. Thought content normal.     Labs reviewed: Office Visit on 02/02/2015  Component Date Value Ref Range Status  . TSH 02/02/2015 0.619  0.450 - 4.500  uIU/mL Final  . WBC 02/02/2015 7.1  3.4 - 10.8 x10E3/uL Final  . RBC 02/02/2015 2.96* 3.77 - 5.28 x10E6/uL Final  . Hemoglobin 02/02/2015 9.3* 11.1 - 15.9 g/dL Final  . Hematocrit 11/91/4782 28.5* 34.0 - 46.6 % Final  . MCV 02/02/2015 96  79 - 97 fL Final  . MCH 02/02/2015 31.4  26.6 - 33.0 pg Final  . MCHC 02/02/2015 32.6  31.5 - 35.7 g/dL Final  . RDW 95/62/1308 16.7* 12.3 - 15.4 %  Final  . Platelets 02/02/2015 404* 150 - 379 x10E3/uL Final  . Neutrophils 02/02/2015 71   Final  . Lymphs 02/02/2015 15   Final  . Monocytes 02/02/2015 8   Final  . Eos 02/02/2015 5   Final  . Basos 02/02/2015 1   Final  . Neutrophils Absolute 02/02/2015 5.1  1.4 - 7.0 x10E3/uL Final  . Lymphocytes Absolute 02/02/2015 1.1  0.7 - 3.1 x10E3/uL Final  . Monocytes Absolute 02/02/2015 0.5  0.1 - 0.9 x10E3/uL Final  . EOS (ABSOLUTE) 02/02/2015 0.3  0.0 - 0.4 x10E3/uL Final  . Basophils Absolute 02/02/2015 0.1  0.0 - 0.2 x10E3/uL Final  . Immature Granulocytes 02/02/2015 0   Final  . Immature Grans (Abs) 02/02/2015 0.0  0.0 - 0.1 x10E3/uL Final  . Glucose 02/02/2015 113* 65 - 99 mg/dL Final  . BUN 65/78/4696 40* 8 - 27 mg/dL Final  . Creatinine, Ser 02/02/2015 1.18* 0.57 - 1.00 mg/dL Final  . GFR calc non Af Amer 02/02/2015 41* >59 mL/min/1.73 Final  . GFR calc Af Amer 02/02/2015 47* >59 mL/min/1.73 Final  . BUN/Creatinine Ratio 02/02/2015 34* 11 - 26 Final  . Sodium 02/02/2015 136  136 - 144 mmol/L Final  . Potassium 02/02/2015 5.1  3.5 - 5.2 mmol/L Final  . Chloride 02/02/2015 100  97 - 106 mmol/L Final  . CO2 02/02/2015 20  18 - 29 mmol/L Final  . Calcium 02/02/2015 9.2  8.7 - 10.3 mg/dL Final  Admission on 29/52/8413, Discharged on 01/29/2015  Component Date Value Ref Range Status  . Sodium 01/27/2015 126* 135 - 145 mmol/L Final  . Potassium 01/27/2015 4.1  3.5 - 5.1 mmol/L Final  . Chloride 01/27/2015 96* 101 - 111 mmol/L Final  . CO2 01/27/2015 20* 22 - 32 mmol/L Final  . Glucose, Bld 01/27/2015 119* 65 - 99 mg/dL Final  . BUN 24/40/1027 49* 6 - 20 mg/dL Final  . Creatinine, Ser 01/27/2015 1.16* 0.44 - 1.00 mg/dL Final  . Calcium 25/36/6440 8.5* 8.9 - 10.3 mg/dL Final  . GFR calc non Af Amer 01/27/2015 40* >60 mL/min Final  . GFR calc Af Amer 01/27/2015 47* >60 mL/min Final   Comment: (NOTE) The eGFR has been calculated using the CKD EPI equation. This calculation has not been  validated in all clinical situations. eGFR's persistently <60 mL/min signify possible Chronic Kidney Disease.   . Anion gap 01/27/2015 10  5 - 15 Final  . WBC 01/27/2015 7.0  4.0 - 10.5 K/uL Final  . RBC 01/27/2015 2.81* 3.87 - 5.11 MIL/uL Final  . Hemoglobin 01/27/2015 8.8* 12.0 - 15.0 g/dL Final  . HCT 34/74/2595 25.8* 36.0 - 46.0 % Final  . MCV 01/27/2015 91.8  78.0 - 100.0 fL Final  . MCH 01/27/2015 31.3  26.0 - 34.0 pg Final  . MCHC 01/27/2015 34.1  30.0 - 36.0 g/dL Final  . RDW 63/87/5643 14.2  11.5 - 15.5 % Final  . Platelets 01/27/2015 298  150 - 400 K/uL Final  .  Color, Urine 01/27/2015 YELLOW  YELLOW Final  . APPearance 01/27/2015 CLEAR  CLEAR Final  . Specific Gravity, Urine 01/27/2015 1.020  1.005 - 1.030 Final  . pH 01/27/2015 5.0  5.0 - 8.0 Final  . Glucose, UA 01/27/2015 NEGATIVE  NEGATIVE mg/dL Final  . Hgb urine dipstick 01/27/2015 NEGATIVE  NEGATIVE Final  . Bilirubin Urine 01/27/2015 NEGATIVE  NEGATIVE Final  . Ketones, ur 01/27/2015 NEGATIVE  NEGATIVE mg/dL Final  . Protein, ur 78/29/5621 NEGATIVE  NEGATIVE mg/dL Final  . Urobilinogen, UA 01/27/2015 0.2  0.0 - 1.0 mg/dL Final  . Nitrite 30/86/5784 NEGATIVE  NEGATIVE Final  . Leukocytes, UA 01/27/2015 NEGATIVE  NEGATIVE Final   MICROSCOPIC NOT DONE ON URINES WITH NEGATIVE PROTEIN, BLOOD, LEUKOCYTES, NITRITE, OR GLUCOSE <1000 mg/dL.  [name] Glucose-Capillary 01/27/2015 112* 65 - 99 mg/dL Final  . Total Protein 01/27/2015 6.3* 6.5 - 8.1 g/dL Final  . Albumin 69/62/9528 2.6* 3.5 - 5.0 g/dL Final  . AST 41/32/4401 46* 15 - 41 U/L Final  . ALT 01/27/2015 52  14 - 54 U/L Final  . Alkaline Phosphatase 01/27/2015 68  38 - 126 U/L Final  . Total Bilirubin 01/27/2015 0.2* 0.3 - 1.2 mg/dL Final  . Bilirubin, Direct 01/27/2015 0.2  0.1 - 0.5 mg/dL Final  . Indirect Bilirubin 01/27/2015 0.0* 0.3 - 0.9 mg/dL Final  . Sodium 02/72/5366 130* 135 - 145 mmol/L Final  . Potassium 01/28/2015 5.0  3.5 - 5.1 mmol/L Final   DELTA CHECK  NOTED  . Chloride 01/28/2015 101  101 - 111 mmol/L Final  . CO2 01/28/2015 21* 22 - 32 mmol/L Final  . Glucose, Bld 01/28/2015 110* 65 - 99 mg/dL Final  . BUN 44/06/4740 49* 6 - 20 mg/dL Final  . Creatinine, Ser 01/28/2015 1.05* 0.44 - 1.00 mg/dL Final  . Calcium 59/56/3875 8.4* 8.9 - 10.3 mg/dL Final  . GFR calc non Af Amer 01/28/2015 46* >60 mL/min Final  . GFR calc Af Amer 01/28/2015 53* >60 mL/min Final   Comment: (NOTE) The eGFR has been calculated using the CKD EPI equation. This calculation has not been validated in all clinical situations. eGFR's persistently <60 mL/min signify possible Chronic Kidney Disease.   . Anion gap 01/28/2015 8  5 - 15 Final  . Sodium 01/28/2015 130* 135 - 145 mmol/L Final  . Potassium 01/28/2015 3.8  3.5 - 5.1 mmol/L Final   DELTA CHECK NOTED  . Chloride 01/28/2015 102  101 - 111 mmol/L Final  . CO2 01/28/2015 21* 22 - 32 mmol/L Final  . Glucose, Bld 01/28/2015 154* 65 - 99 mg/dL Final  . BUN 64/33/2951 46* 6 - 20 mg/dL Final  . Creatinine, Ser 01/28/2015 1.02* 0.44 - 1.00 mg/dL Final  . Calcium 88/41/6606 8.3* 8.9 - 10.3 mg/dL Final  . GFR calc non Af Amer 01/28/2015 47* >60 mL/min Final  . GFR calc Af Amer 01/28/2015 55* >60 mL/min Final   Comment: (NOTE) The eGFR has been calculated using the CKD EPI equation. This calculation has not been validated in all clinical situations. eGFR's persistently <60 mL/min signify possible Chronic Kidney Disease.   . Anion gap 01/28/2015 7  5 - 15 Final  . Sodium 01/28/2015 134* 135 - 145 mmol/L Final  . Potassium 01/28/2015 3.7  3.5 - 5.1 mmol/L Final  . Chloride 01/28/2015 105  101 - 111 mmol/L Final  . CO2 01/28/2015 21* 22 - 32 mmol/L Final  . Glucose, Bld 01/28/2015 124* 65 - 99 mg/dL Final  .  BUN 01/28/2015 43* 6 - 20 mg/dL Final  . Creatinine, Ser 01/28/2015 0.97  0.44 - 1.00 mg/dL Final  . Calcium 16/01/9603 8.5* 8.9 - 10.3 mg/dL Final  . GFR calc non Af Amer 01/28/2015 50* >60 mL/min Final  .  GFR calc Af Amer 01/28/2015 58* >60 mL/min Final   Comment: (NOTE) The eGFR has been calculated using the CKD EPI equation. This calculation has not been validated in all clinical situations. eGFR's persistently <60 mL/min signify possible Chronic Kidney Disease.   . Anion gap 01/28/2015 8  5 - 15 Final  . Sodium 01/28/2015 133* 135 - 145 mmol/L Final  . Potassium 01/28/2015 4.1  3.5 - 5.1 mmol/L Final  . Chloride 01/28/2015 103  101 - 111 mmol/L Final  . CO2 01/28/2015 20* 22 - 32 mmol/L Final  . Glucose, Bld 01/28/2015 112* 65 - 99 mg/dL Final  . BUN 54/12/8117 40* 6 - 20 mg/dL Final  . Creatinine, Ser 01/28/2015 0.91  0.44 - 1.00 mg/dL Final  . Calcium 14/78/2956 9.0  8.9 - 10.3 mg/dL Final  . GFR calc non Af Amer 01/28/2015 54* >60 mL/min Final  . GFR calc Af Amer 01/28/2015 >60  >60 mL/min Final   Comment: (NOTE) The eGFR has been calculated using the CKD EPI equation. This calculation has not been validated in all clinical situations. eGFR's persistently <60 mL/min signify possible Chronic Kidney Disease.   . Anion gap 01/28/2015 10  5 - 15 Final  . MRSA by PCR 01/28/2015 POSITIVE* NEGATIVE Final   Comment:        The GeneXpert MRSA Assay (FDA approved for NASAL specimens only), is one component of a comprehensive MRSA colonization surveillance program. It is not intended to diagnose MRSA infection nor to guide or monitor treatment for MRSA infections. RESULT CALLED TO, READ BACK BY AND VERIFIED WITH: LASHEETA @0449  01/28/15 MKELLY   . WBC 01/28/2015 6.5  4.0 - 10.5 K/uL Final  . RBC 01/28/2015 2.44* 3.87 - 5.11 MIL/uL Final  . Hemoglobin 01/28/2015 7.8* 12.0 - 15.0 g/dL Final  . HCT 21/30/8657 22.6* 36.0 - 46.0 % Final  . MCV 01/28/2015 92.6  78.0 - 100.0 fL Final  . MCH 01/28/2015 32.0  26.0 - 34.0 pg Final  . MCHC 01/28/2015 34.5  30.0 - 36.0 g/dL Final  . RDW 84/69/6295 14.3  11.5 - 15.5 % Final  . Platelets 01/28/2015 286  150 - 400 K/uL Final  . Neutrophils  Relative % 01/28/2015 60   Final  . Neutro Abs 01/28/2015 4.0  1.7 - 7.7 K/uL Final  . Lymphocytes Relative 01/28/2015 13   Final  . Lymphs Abs 01/28/2015 0.8  0.7 - 4.0 K/uL Final  . Monocytes Relative 01/28/2015 16   Final  . Monocytes Absolute 01/28/2015 1.0  0.1 - 1.0 K/uL Final  . Eosinophils Relative 01/28/2015 10   Final  . Eosinophils Absolute 01/28/2015 0.6  0.0 - 0.7 K/uL Final  . Basophils Relative 01/28/2015 1   Final  . Basophils Absolute 01/28/2015 0.0  0.0 - 0.1 K/uL Final  . Glucose-Capillary 01/28/2015 131* 65 - 99 mg/dL Final  . Comment 1 28/41/3244 Notify RN   Final  . Comment 2 01/28/2015 Document in Chart   Final  . ABO/RH(D) 01/28/2015 AB POS   Final  . Antibody Screen 01/28/2015 NEG   Final  . Sample Expiration 01/28/2015 01/31/2015   Final  . Unit Number 01/28/2015 [id number]   Final  . Blood Component Type 01/28/2015  RED CELLS,LR   Final  . Unit division 01/28/2015 00   Final  . Status of Unit 01/28/2015 ISSUED,FINAL   Final  . Donor AG Type 01/28/2015 NEGATIVE FOR E ANTIGEN NEGATIVE FOR KELL ANTIGEN   Final  . Transfusion Status 01/28/2015 OK TO TRANSFUSE   Final  . Crossmatch Result 01/28/2015 COMPATIBLE   Final  . Order Confirmation 01/28/2015 ORDER PROCESSED BY BLOOD BANK   Final  . Sodium 01/29/2015 136  135 - 145 mmol/L Final  . Potassium 01/29/2015 4.3  3.5 - 5.1 mmol/L Final  . Chloride 01/29/2015 107  101 - 111 mmol/L Final  . CO2 01/29/2015 22  22 - 32 mmol/L Final  . Glucose, Bld 01/29/2015 85  65 - 99 mg/dL Final  . BUN 16/01/9603 31* 6 - 20 mg/dL Final  . Creatinine, Ser 01/29/2015 0.86  0.44 - 1.00 mg/dL Final  . Calcium 54/12/8117 8.7* 8.9 - 10.3 mg/dL Final  . GFR calc non Af Amer 01/29/2015 58* >60 mL/min Final  . GFR calc Af Amer 01/29/2015 >60  >60 mL/min Final   Comment: (NOTE) The eGFR has been calculated using the CKD EPI equation. This calculation has not been validated in all clinical situations. eGFR's persistently <60 mL/min  signify possible Chronic Kidney Disease.   . Anion gap 01/29/2015 7  5 - 15 Final  . WBC 01/29/2015 7.2  4.0 - 10.5 K/uL Final  . RBC 01/29/2015 3.05* 3.87 - 5.11 MIL/uL Final  . Hemoglobin 01/29/2015 9.3* 12.0 - 15.0 g/dL Final  . HCT 14/78/2956 27.1* 36.0 - 46.0 % Final  . MCV 01/29/2015 88.9  78.0 - 100.0 fL Final  . MCH 01/29/2015 30.5  26.0 - 34.0 pg Final  . MCHC 01/29/2015 34.3  30.0 - 36.0 g/dL Final  . RDW 21/30/8657 16.4* 11.5 - 15.5 % Final  . Platelets 01/29/2015 307  150 - 400 K/uL Final  . Prothrombin Time 01/29/2015 15.3* 11.6 - 15.2 seconds Final  . INR 01/29/2015 1.20  0.00 - 1.49 Final  . Glucose-Capillary 01/29/2015 80  65 - 99 mg/dL Final  . Comment 1 84/69/6295 Notify RN   Final  . Comment 2 01/29/2015 Document in Chart   Final  Admission on 01/10/2015, Discharged on 01/27/2015  No results displayed because visit has over 200 results.      No results found.   Assessment/Plan   ICD-9-CM ICD-10-CM   1. Loss of weight - unchanged 783.21 R63.4 Ambulatory referral to Nutrition and Diabetic Education  2. Postmenopausal estrogen deficiency V49.81 Z78.0 DG Bone Density  3. Slow transit constipation - stable 564.01 K59.01   4. Hypothyroidism, unspecified hypothyroidism type - stable 244.9 E03.9   5. Essential hypertension - stable 401.9 I10 Lipid Panel  6. Diabetic polyneuropathy associated with type 2 diabetes mellitus (HCC) - stable 250.60 E11.42 CMP   357.2  Lipid Panel     Hemoglobin A1c     Ambulatory referral to Nutrition and Diabetic Education   Encourage to eat 3 meals per day with healthy snack in between. Nutrition eval to discuss food choices and meal planning. She has a hx dysphagia and purees her food  May drink ensure or boost at least 2 daily  Continue current medications as ordered  Will call with DXA appt  Follow up in 2 mos for routine visit. Will call with lab results   [facility] [address]  [address] [place], [place] [address] [phone] Cell (Monday-Friday 8 AM - 5 PM) [phone] After 5 PM and follow prompts

## 2015-04-08 ENCOUNTER — Other Ambulatory Visit: Payer: Self-pay | Admitting: Internal Medicine

## 2015-04-08 LAB — COMPREHENSIVE METABOLIC PANEL
ALT: 15 IU/L (ref 0–32)
AST: 20 IU/L (ref 0–40)
Albumin/Globulin Ratio: 1.6 (ref 1.1–2.5)
Albumin: 4.2 g/dL (ref 3.5–4.7)
Alkaline Phosphatase: 83 IU/L (ref 39–117)
BUN/Creatinine Ratio: 30 — ABNORMAL HIGH (ref 11–26)
BUN: 37 mg/dL — ABNORMAL HIGH (ref 8–27)
Bilirubin Total: 0.3 mg/dL (ref 0.0–1.2)
CO2: 22 mmol/L (ref 18–29)
Calcium: 10.5 mg/dL — ABNORMAL HIGH (ref 8.7–10.3)
Chloride: 100 mmol/L (ref 96–106)
Creatinine, Ser: 1.25 mg/dL — ABNORMAL HIGH (ref 0.57–1.00)
GFR calc Af Amer: 44 mL/min/{1.73_m2} — ABNORMAL LOW (ref >59)
GFR calc non Af Amer: 38 mL/min/{1.73_m2} — ABNORMAL LOW (ref >59)
Globulin, Total: 2.7 g/dL (ref 1.5–4.5)
Glucose: 94 mg/dL (ref 65–99)
Potassium: 4.4 mmol/L (ref 3.5–5.2)
Sodium: 136 mmol/L (ref 134–144)
Total Protein: 6.9 g/dL (ref 6.0–8.5)

## 2015-04-08 LAB — LIPID PANEL
Chol/HDL Ratio: 1.8 ratio units (ref 0.0–4.4)
Cholesterol, Total: 126 mg/dL (ref 100–199)
HDL: 69 mg/dL (ref >39)
LDL Calculated: 36 mg/dL (ref 0–99)
Triglycerides: 107 mg/dL (ref 0–149)
VLDL Cholesterol Cal: 21 mg/dL (ref 5–40)

## 2015-04-08 LAB — HEMOGLOBIN A1C
Est. average glucose Bld gHb Est-mCnc: 137 mg/dL
Hgb A1c MFr Bld: 6.4 % — ABNORMAL HIGH (ref 4.8–5.6)

## 2015-04-28 DIAGNOSIS — Z780 Asymptomatic menopausal state: Secondary | ICD-10-CM | POA: Diagnosis not present

## 2015-04-28 DIAGNOSIS — M8589 Other specified disorders of bone density and structure, multiple sites: Secondary | ICD-10-CM | POA: Diagnosis not present

## 2015-04-28 LAB — HM DEXA SCAN

## 2015-04-30 ENCOUNTER — Encounter: Payer: Self-pay | Admitting: *Deleted

## 2015-05-04 ENCOUNTER — Telehealth: Payer: Self-pay

## 2015-05-04 NOTE — Telephone Encounter (Signed)
I spoke with Shannon [name] and informed her that she is at an increased risk for fragility fracture. I also informed her that she is to continue taking calcium and vitamin d supplements as instructed and to repeat bone density scan in 2 years. Shannon [name] stated that she understood these instructions and she repeated them back to me.

## 2015-05-13 ENCOUNTER — Encounter: Payer: Medicare Other | Attending: Internal Medicine | Admitting: *Deleted

## 2015-05-13 DIAGNOSIS — E119 Type 2 diabetes mellitus without complications: Secondary | ICD-10-CM

## 2015-05-13 DIAGNOSIS — R634 Abnormal weight loss: Secondary | ICD-10-CM | POA: Insufficient documentation

## 2015-05-13 DIAGNOSIS — E1142 Type 2 diabetes mellitus with diabetic polyneuropathy: Secondary | ICD-10-CM | POA: Diagnosis not present

## 2015-05-13 NOTE — Patient Instructions (Addendum)
Boost Glucose Control or Glucerna instead of traditional Boost Follow up with PCP about swallowing challenges Increase vegetables, as tolerated There's no indicated reason to limit potassium Increase whole grains

## 2015-05-13 NOTE — Progress Notes (Signed)
  Medical Nutrition Therapy:  Appt start time: 1030 end time:  1130.   Assessment:  Primary concerns today: Shannon [name] is here with her daughter for nutrition counseling.  The referral from her provider indicates type 2 diabetes, but daughter states she doesn't have diabetes.  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 A1C is 6.4%.  She is not currently taking any antihyperglycemic agents, but does check her glucose in the mornings daily.  Daughter reports that the main nutrition concerns are not glycemic control, but rather what can Shannon [name] eat due to her "stomach issues" and can she eat high potassium foods? Has stomach issue that doesn't allow her to eat anything with too much fiber (no leafy vegetables). Also is edentulous and foods are pureed. "had surgery last year on her stomach and had to switch some things around." had blockage recently and removed scar tissue so food can move more freely throughout GI tract She is also concerned about her potassium and limits her potassium foods   Preferred Learning Style:  No preference indicated   Learning Readiness:   Ready  MEDICATIONS: see list   DIETARY INTAKE:  Usual eating pattern includes 3 meals and 0 snacks per day.  Avoided foods include leafy vegetables, corn, broccoli, carrots, bananas, cantaloupe.    24 hour recall B: cream of wheat and 2 hotdog  L: soup, Boost, peanut butter crackers D: green beans, chicken, parsnips, mac-n-cheese, diet ginger ale  Usual physical activity: none  Estimated energy needs: 1800 calories    Nutritional Diagnosis:  NB-1.1 Food and nutrition-related knowledge deficit As related to how to meet her nutrition needs.  As evidenced by self-report.    Intervention:  Nutrition counseling provided.  After reviewing her labs, recent office visit note, and recent hospitalization, it appears there is no reason for Shannon [name] to limit her  potassium intake.  Her GFR is low, but her potassium levels are WNL.  Suggested limiting high potassium foods to no more than 1/day.  As all her foods are pureed, it seems there is no reason to limit fibrous foods like leafy vegetables.  Suggested following up with PCP re her swallowing difficulties and in the meantime eat slowly, take small bites and drink fluids in between.  Advised against sugary beverages like juice and Boost.  Suggested lower sugar nutrition supplement.  Advised MyPlate for diabetes recommendations and to increase fat for weight gain, not sugars  Teaching Method Utilized:  Visual Auditory   Handouts given during visit include:  MyPlate for diabetes  Barriers to learning/adherence to lifestyle change: none  Demonstrated degree of understanding via:  Teach Back   Monitoring/Evaluation:  Dietary intake, labs, and body weight prn.

## 2015-05-19 ENCOUNTER — Other Ambulatory Visit: Payer: Self-pay | Admitting: Internal Medicine

## 2015-05-24 ENCOUNTER — Telehealth: Payer: Self-pay | Admitting: *Deleted

## 2015-05-24 DIAGNOSIS — I10 Essential (primary) hypertension: Secondary | ICD-10-CM

## 2015-05-24 NOTE — Telephone Encounter (Signed)
Is that the only pill that they see? If so, stop med and see if BMs nml. Check BMP in 1 week. F/u as scheduled

## 2015-05-24 NOTE — Telephone Encounter (Signed)
[name], daughter called and Left Message. I could not understand message. Called [name] back and [facility] to return call.

## 2015-05-24 NOTE — Telephone Encounter (Signed)
Patient daughter called and stated that patient is passing her pills in her stool. She is seeing whole pills in her stool and wants to know what she should do. She doesn't think the pills are helping her if they are passing whole through her. It is the 3rd time it has happened. They scooped the pill out and it looks like her salt pill.  Please Advise.

## 2015-05-24 NOTE — Telephone Encounter (Signed)
Patient daughter notified and agreed can only come in on Wednesday for lab. Appointment scheduled and order placed.

## 2015-05-26 ENCOUNTER — Other Ambulatory Visit: Payer: Self-pay

## 2015-05-26 ENCOUNTER — Other Ambulatory Visit: Payer: Medicare Other

## 2015-05-26 DIAGNOSIS — I10 Essential (primary) hypertension: Secondary | ICD-10-CM

## 2015-05-27 LAB — BASIC METABOLIC PANEL
BUN/Creatinine Ratio: 24 (ref 11–26)
BUN: 31 mg/dL — ABNORMAL HIGH (ref 8–27)
CO2: 24 mmol/L (ref 18–29)
Calcium: 9.8 mg/dL (ref 8.7–10.3)
Chloride: 98 mmol/L (ref 96–106)
Creatinine, Ser: 1.3 mg/dL — ABNORMAL HIGH (ref 0.57–1.00)
GFR calc Af Amer: 42 mL/min/{1.73_m2} — ABNORMAL LOW (ref >59)
GFR calc non Af Amer: 36 mL/min/{1.73_m2} — ABNORMAL LOW (ref >59)
Glucose: 90 mg/dL (ref 65–99)
Potassium: 4.7 mmol/L (ref 3.5–5.2)
Sodium: 139 mmol/L (ref 134–144)

## 2015-05-28 ENCOUNTER — Ambulatory Visit (INDEPENDENT_AMBULATORY_CARE_PROVIDER_SITE_OTHER): Payer: Medicare Other | Admitting: Podiatry

## 2015-05-28 ENCOUNTER — Encounter: Payer: Self-pay | Admitting: Podiatry

## 2015-05-28 DIAGNOSIS — M79676 Pain in unspecified toe(s): Secondary | ICD-10-CM | POA: Diagnosis not present

## 2015-05-28 DIAGNOSIS — B351 Tinea unguium: Secondary | ICD-10-CM

## 2015-05-28 DIAGNOSIS — E1149 Type 2 diabetes mellitus with other diabetic neurological complication: Secondary | ICD-10-CM | POA: Diagnosis not present

## 2015-05-28 NOTE — Progress Notes (Signed)
Patient ID: Shannon [name], female   DOB: 01-May-1923, 82 y.o.   MRN: [id number] HPI  Complaint:  Visit Type: Patient returns to my office for continued preventative foot care services. Complaint: Patient states" my nails have grown long and thick and become painful to walk and wear shoes" Patient has been diagnosed with DM . This patient  presents for preventative foot care services. No changes to ROS  Podiatric Exam: Vascular: dorsalis pedis and posterior tibial pulses are non-palpable. Capillary return is diminished. Cold feet noted.. Skin turgor WNL, bilateral swelling  Sensorium: Diminished  Semmes Weinstein monofilament test. Normal tactile sensation bilaterally.  Nail Exam: Pt has thick disfigured discolored nails with subungual debris noted bilateral entire nail hallux through fifth toenails Ulcer Exam: There is no evidence of ulcer or pre-ulcerative changes or infection. Orthopedic Exam: Muscle tone and strength are WNL. No limitations in general ROM. No crepitus or effusions noted. Foot type and digits show no abnormalities. Bony prominences are unremarkable. Skin: No Porokeratosis. No infection or ulcers  Diagnosis:  Onychomycosis, Pain in right toe, pain in left toes  Treatment & Plan Procedures and Treatment: Consent by patient was obtained for treatment procedures. The patient understood the discussion of treatment and procedures well. All questions were answered thoroughly reviewed. Debridement of mycotic and hypertrophic toenails, 1 through 5 bilateral and clearing of subungual debris. No ulceration, no infection noted.  Return Visit-Office Procedure: Patient instructed to return to the office for a follow up visit 3 months for continued evaluation and treatment.    Gardiner Barefoot DPM

## 2015-05-31 ENCOUNTER — Other Ambulatory Visit: Payer: Self-pay | Admitting: Internal Medicine

## 2015-05-31 ENCOUNTER — Other Ambulatory Visit: Payer: Self-pay | Admitting: Nurse Practitioner

## 2015-06-04 ENCOUNTER — Encounter: Payer: Self-pay | Admitting: Internal Medicine

## 2015-06-04 ENCOUNTER — Ambulatory Visit (INDEPENDENT_AMBULATORY_CARE_PROVIDER_SITE_OTHER): Payer: Medicare Other | Admitting: Internal Medicine

## 2015-06-04 VITALS — BP 130/62 | HR 52 | Temp 97.7°F | Ht 66.0 in | Wt 140.0 lb

## 2015-06-04 DIAGNOSIS — E1142 Type 2 diabetes mellitus with diabetic polyneuropathy: Secondary | ICD-10-CM

## 2015-06-04 DIAGNOSIS — J301 Allergic rhinitis due to pollen: Secondary | ICD-10-CM | POA: Diagnosis not present

## 2015-06-04 DIAGNOSIS — M542 Cervicalgia: Secondary | ICD-10-CM | POA: Diagnosis not present

## 2015-06-04 DIAGNOSIS — Z8673 Personal history of transient ischemic attack (TIA), and cerebral infarction without residual deficits: Secondary | ICD-10-CM

## 2015-06-04 DIAGNOSIS — E039 Hypothyroidism, unspecified: Secondary | ICD-10-CM

## 2015-06-04 DIAGNOSIS — E871 Hypo-osmolality and hyponatremia: Secondary | ICD-10-CM

## 2015-06-04 DIAGNOSIS — I482 Chronic atrial fibrillation, unspecified: Secondary | ICD-10-CM

## 2015-06-04 DIAGNOSIS — I10 Essential (primary) hypertension: Secondary | ICD-10-CM | POA: Diagnosis not present

## 2015-06-04 DIAGNOSIS — J45909 Unspecified asthma, uncomplicated: Secondary | ICD-10-CM | POA: Diagnosis not present

## 2015-06-04 MED ORDER — ALBUTEROL SULFATE HFA 108 (90 BASE) MCG/ACT IN AERS: 2.0000 | INHALATION_SPRAY | Freq: Four times a day (QID) | RESPIRATORY_TRACT | Status: DC | PRN

## 2015-06-04 NOTE — Patient Instructions (Signed)
Continue current medications as ordered  Take tramadol as needed for pain  Use inhaler as needed for chest tightness, wheezing or shortness of breath  Follow up in 2 mos for routine visit.   Schedule lab appt (nonfasting) - needs repeat lab in 2 weeks

## 2015-06-04 NOTE — Progress Notes (Signed)
Patient ID: Shannon [name], female   DOB: [birth year], 82 y.o.   MRN: [id number]    Location:       Place of Service:     Chief Complaint  Patient presents with  . Medical Management of Chronic Issues    medication management, blood pressure, blood sugar, thyroid, weight loss. Here with daughter Shannon [name]    HPI:  82 yo female seen today for f/u. She c/o neck pain and is causing her to not sleep well. She stopped tramadol as she was c/a "getting hooked on it". No numbness or tingling in arms but has paresthesias/numbness in feet--> legs. Takes tylenol ES  Weight loss - she has gained 6 lbs since last month. She did see the nutritionist.  She has seasonal allergy sx's and takes zyrtec daily and has flonase. She takes advair for asthma  She does not take any medications for diabetes. She has numbness or tingling in feet. She would like to see podiatry due to foot deformities. She has been going to [place] for foot care.  No seizures since last OV. Takes gabapentin  HTN/afib/hyperlipidemia - BP controlled on meds. Cholesterol stable on lipitor. Rate controlled on metoprolol succ  Thyroid stable on levothyroxine  GI told her to f/u prn. She is still doing pureed diet. No dysphagia or dyspesia. Takes PPI BID. Constipation stable on lactulose  Hyponatremia - she was taking salt tabs but over the last several weeks has noticed the entire tab in her stool intermittently. Na 139 about 2 weeks ago  DM - controlled. A1c 6.4%. Takes gabapentin for neuropathy  Hx CVA - stable. Takes ASA daily  Depression - mood stable on sertraline  Past Medical History  Diagnosis Date  . Pneumonia   . Hyperlipidemia   . Hypertension   . Small bowel obstruction (HCC)     "I don't know how many" (01/11/2015)  . Ventral hernia with bowel obstruction   . Asthma   . Hyperlipidemia   . Stroke (HCC)     "light one"  . COPD (chronic obstructive pulmonary disease) (HCC)   . Hypothyroidism   . Type II  diabetes mellitus (HCC)   . Anemia   . History of blood transfusion   . GERD (gastroesophageal reflux disease)   . History of stomach ulcers   . Arthritis     "all over"  . Renal insufficiency   . Depression     "light case"  . SIADH (syndrome of inappropriate ADH production) (HCC)     Shannon [name] 01/10/2015    Past Surgical History  Procedure Laterality Date  . Tubal ligation    . Colectomy    . Hernia repair  2015  . Esophagogastroduodenoscopy N/A 01/19/2014    Procedure: ESOPHAGOGASTRODUODENOSCOPY (EGD);  Surgeon: Hilarie Fredrickson, MD;  Location: [facility];  Service: Endoscopy;  Laterality: N/A;  . Esophagogastroduodenoscopy N/A 01/20/2014    Procedure: ESOPHAGOGASTRODUODENOSCOPY (EGD);  Surgeon: Hilarie Fredrickson, MD;  Location: [facility];  Service: Endoscopy;  Laterality: N/A;  . Esophagogastroduodenoscopy N/A 03/19/2014    Procedure: ESOPHAGOGASTRODUODENOSCOPY (EGD);  Surgeon: Rachael Fee, MD;  Location: [facility];  Service: Endoscopy;  Laterality: N/A;  . Cholecystectomy open    . Ventral hernia repair  2015    incarcerated ventral hernia ([facility] 09/2013)/notes 01/10/2015  . Cataract extraction w/ intraocular lens  implant, bilateral    . Gastrojejunostomy      hx/notes 01/10/2015  . Laparotomy N/A 01/20/2015    Procedure: EXPLORATORY LAPAROTOMY;  Surgeon: Riley Lam  Magnus Ivan, MD;  Location: [facility] OR;  Service: General;  Laterality: N/A;  . Lysis of adhesion N/A 01/20/2015    Procedure: LYSIS OF ADHESIONS < 1 HOUR;  Surgeon: Abigail Miyamoto, MD;  Location: [facility] OR;  Service: General;  Laterality: N/A;    Patient Care Team: Kirt Boys, DO as PCP - General (Internal Medicine) Jason Coop, MD as Referring Physician (Cardiology) Carole Binning, MD as Referring Physician (Ophthalmology)  Social History   Social History  . Marital Status: Widowed    Spouse Name: N/A  . Number of Children: N/A  . Years of Education: N/A   Occupational History  . Not on file.     Social History Main Topics  . Smoking status: Never Smoker   . Smokeless tobacco: Former Neurosurgeon    Types: Snuff  . Alcohol Use: No  . Drug Use: No  . Sexual Activity: No   Other Topics Concern  . Not on file   Social History Narrative   Married in [place], lives in one story house with 2 people and no pets   Occupation: ?   Has a living Will, [place], and doesn't have DNR   Was living with Husband (in frail health) in [place] until 09/2013.  After her [facility] admission they both came to live with daughter in [place]     reports that she has never smoked. She has quit using smokeless tobacco. Her smokeless tobacco use included Snuff. She reports that she does not drink alcohol or use illicit drugs.  No Known Allergies  Medications: Patient's Medications  New Prescriptions   No medications on file  Previous Medications   ACETAMINOPHEN (TYLENOL) 325 MG TABLET    Take 650 mg by mouth every 6 (six) hours as needed for mild pain or fever.   ADVAIR DISKUS 250-50 MCG/DOSE AEPB    INHALE 1 PUFF INTO THE LUNGS 2 (TWO) TIMES DAILY AS NEEDED FOR SHORTNESS OF BREATH   ALBUTEROL (PROVENTIL HFA;VENTOLIN HFA) 108 (90 BASE) MCG/ACT INHALER    Inhale 2 puffs into the lungs every 6 (six) hours as needed for wheezing or shortness of breath.   AMLODIPINE (NORVASC) 10 MG TABLET    TAKE 1 TABLET BY MOUTH DAILY FOR HYPERTENSION   ASPIRIN EC 81 MG TABLET    Take 81 mg by mouth every morning.   ATORVASTATIN (LIPITOR) 10 MG TABLET    TAKE 1 TABLET BY MOUTH EVERY DAY   B COMPLEX VITAMINS TABLET    Take 1 tablet by mouth 2 (two) times daily.   CALCIUM-VITAMIN D (OSCAL WITH D) 500-200 MG-UNIT PER TABLET    Take 1 tablet by mouth daily with breakfast.    CHLORHEXIDINE (PERIDEX) 0.12 % SOLUTION    15 mLs by Mouth Rinse route 2 (two) times daily.   CLONIDINE (CATAPRES) 0.1 MG TABLET    Take 1 tablet (0.1 mg total) by mouth 2 (two) times daily.   DICLOFENAC SODIUM (VOLTAREN) 1 % GEL    Apply 2 g topically daily. 2  times daily on the area.   FEEDING SUPPLEMENT (BOOST / RESOURCE BREEZE) LIQD    Take 1 Container by mouth 3 (three) times daily between meals.   FLUTICASONE (FLONASE) 50 MCG/ACT NASAL SPRAY    Place 2 sprays into both nostrils daily.   GABAPENTIN (NEURONTIN) 300 MG CAPSULE    TAKE 1 CAPSULE (300 MG TOTAL) BY MOUTH 3 (THREE) TIMES DAILY.   HYDRALAZINE (APRESOLINE) 100 MG TABLET    TAKE 1  TABLET (100 MG TOTAL) BY MOUTH 3 (THREE) TIMES DAILY.   ISOSORBIDE MONONITRATE (IMDUR) 30 MG 24 HR TABLET    Take one tablet by mouth once daily. If Chest Pains occur increase to two tablets daily.   LACTULOSE (CHRONULAC) 10 GM/15ML SOLUTION    Take 15 mLs (10 g total) by mouth daily as needed for mild constipation.   LATANOPROST (XALATAN) 0.005 % OPHTHALMIC SOLUTION    Place 1 drop into both eyes at bedtime.   LEVOTHYROXINE (SYNTHROID, LEVOTHROID) 100 MCG TABLET    TAKE 1 TABLET (100 MCG TOTAL) BY MOUTH DAILY. TAKE WITH DOSE (TOTAL )   LEVOTHYROXINE (SYNTHROID, LEVOTHROID) 125 MCG TABLET    Take 1 tablet (125 mcg total) by mouth daily before breakfast. Take with dose (total )   METOPROLOL SUCCINATE (TOPROL-XL) 25 MG 24 HR TABLET    TAKE 1 TABLET (25 MG TOTAL) BY MOUTH DAILY.   MULTIPLE VITAMINS-MINERALS (MULTIVITAMIN WITH MINERALS) TABLET    Take 1 tablet by mouth every morning.    PANTOPRAZOLE (PROTONIX) 40 MG TABLET    TAKE 1 TABLET BY MOUTH TWICE A DAY   POLYETHYL GLYCOL-PROPYL GLYCOL 0.4-0.3 % SOLN    Place 1 drop into both eyes 4 (four) times daily.    POLYETHYLENE GLYCOL POWDER (GLYCOLAX/MIRALAX) POWDER    Take 17 g by mouth 2 (two) times daily as needed.   RESTASIS 0.05 % OPHTHALMIC EMULSION    USE 1 DROP INTO BOTH EYES TWICE DAILY   SENNOSIDES-DOCUSATE SODIUM (SENOKOT-S) 8.6-50 MG TABLET    Take 1 tablet by mouth 2 (two) times daily.    SERTRALINE (ZOLOFT) 100 MG TABLET    TAKE ONE TABLET BY MOUTH ONCE DAILY FOR DEPRESSION   SIMETHICONE (MYLICON) 80 MG CHEWABLE TABLET    Chew 1  tablet (80 mg total) by mouth 4 (four) times daily as needed for flatulence.   SODIUM CHLORIDE 1 G TABLET    TAKE 1 TABLET BY MOUTH THREE TIMES DAILY   TRAMADOL (ULTRAM) 50 MG TABLET    Take by mouth 2 (two) times daily as needed for moderate pain.  Modified Medications   No medications on file  Discontinued Medications   POLYETHYLENE GLYCOL (MIRALAX / GLYCOLAX) PACKET    TAKE 17GM BY MOUTH TWICE DAILY FOR CONSTIPATION    Review of Systems  Constitutional: Positive for fatigue.  Respiratory: Positive for chest tightness and shortness of breath.   Musculoskeletal: Positive for joint swelling, arthralgias, gait problem and neck pain. Negative for neck stiffness.  Neurological: Negative for seizures and weakness.  Psychiatric/Behavioral: Positive for sleep disturbance and dysphoric mood.  All other systems reviewed and are negative.   Filed Vitals:   06/04/15 1150  BP: 130/62  Pulse: 52  Temp: 97.7 F (36.5 C)  TempSrc: Oral  Height: 5\' 6"  (1.676 m)  Weight: 140 lb (63.504 kg)  SpO2: 97%   Body mass index is 22.61 kg/(m^2).  Physical Exam  Constitutional: She is oriented to person, place, and time. She appears well-developed and well-nourished.  HENT:  Mouth/Throat: Oropharynx is clear and moist. No oropharyngeal exudate.  Eyes: Pupils are equal, round, and reactive to light. No scleral icterus.  Neck: Neck supple. Muscular tenderness present. No spinous process tenderness present. Carotid bruit is not present. No rigidity. Decreased range of motion present. No tracheal deviation, no edema and no erythema present. No thyromegaly present.    Reduced neck ROM  Cardiovascular: Normal rate, regular rhythm and intact distal pulses.  Exam  reveals no gallop and no friction rub.   Murmur (1/6 SEM) heard. No LE edema b/l. no calf TTP.   Pulmonary/Chest: Effort normal. No stridor. No respiratory distress. She has wheezes (end expiratory with prolonged expiration). She has no rales.    Abdominal: Soft. Bowel sounds are normal. She exhibits no distension and no mass. There is no hepatomegaly. There is no tenderness. There is no rebound and no guarding.  Musculoskeletal: She exhibits edema and tenderness.  Lymphadenopathy:    She has no cervical adenopathy.  Neurological: She is alert and oriented to person, place, and time.  Skin: Skin is warm and dry. No rash noted.  Psychiatric: She has a normal mood and affect. Her behavior is normal. Judgment and thought content normal.     Labs reviewed: Appointment on 05/26/2015  Component Date Value Ref Range Status  . Glucose 05/26/2015 90  65 - 99 mg/dL Final  . BUN 95/63/8756 31* 8 - 27 mg/dL Final  . Creatinine, Ser 05/26/2015 1.30* 0.57 - 1.00 mg/dL Final  . GFR calc non Af Amer 05/26/2015 36* >59 mL/min/1.73 Final  . GFR calc Af Amer 05/26/2015 42* >59 mL/min/1.73 Final  . BUN/Creatinine Ratio 05/26/2015 24  11 - 26 Final  . Sodium 05/26/2015 139  134 - 144 mmol/L Final  . Potassium 05/26/2015 4.7  3.5 - 5.2 mmol/L Final  . Chloride 05/26/2015 98  96 - 106 mmol/L Final  . CO2 05/26/2015 24  18 - 29 mmol/L Final  . Calcium 05/26/2015 9.8  8.7 - 10.3 mg/dL Final  Abstract on 43/32/9518  Component Date Value Ref Range Status  . HM Dexa Scan 04/28/2015 [facility]: Osteopenia   Final   [facility] [phone]  Office Visit on 04/07/2015  Component Date Value Ref Range Status  . Glucose 04/07/2015 94  65 - 99 mg/dL Final  . BUN 04/11/3233 37* 8 - 27 mg/dL Final  . Creatinine, Ser 04/07/2015 1.25* 0.57 - 1.00 mg/dL Final  . GFR calc non Af Amer 04/07/2015 38* >59 mL/min/1.73 Final  . GFR calc Af Amer 04/07/2015 44* >59 mL/min/1.73 Final  . BUN/Creatinine Ratio 04/07/2015 30* 11 - 26 Final  . Sodium 04/07/2015 136  134 - 144 mmol/L Final  . Potassium 04/07/2015 4.4  3.5 - 5.2 mmol/L Final  . Chloride 04/07/2015 100  96 - 106 mmol/L Final  . CO2 04/07/2015 22  18 - 29 mmol/L Final  . Calcium 04/07/2015 10.5* 8.7 - 10.3 mg/dL  Final  . Total Protein 04/07/2015 6.9  6.0 - 8.5 g/dL Final  . Albumin 57/32/2025 4.2  3.5 - 4.7 g/dL Final  . Globulin, Total 04/07/2015 2.7  1.5 - 4.5 g/dL Final  . Albumin/Globulin Ratio 04/07/2015 1.6  1.1 - 2.5 Final  . Bilirubin Total 04/07/2015 0.3  0.0 - 1.2 mg/dL Final  . Alkaline Phosphatase 04/07/2015 83  39 - 117 IU/L Final  . AST 04/07/2015 20  0 - 40 IU/L Final  . ALT 04/07/2015 15  0 - 32 IU/L Final  . Cholesterol, Total 04/07/2015 126  100 - 199 mg/dL Final  . Triglycerides 04/07/2015 107  0 - 149 mg/dL Final  . HDL 42/70/6237 69  >39 mg/dL Final  . VLDL Cholesterol Cal 04/07/2015 21  5 - 40 mg/dL Final  . LDL Calculated 04/07/2015 36  0 - 99 mg/dL Final  . Chol/HDL Ratio 04/07/2015 1.8  0.0 - 4.4 ratio units Final   Comment:  T. Chol/HDL Ratio                                             Men  Women                               1/2 Avg.Risk  3.4    3.3                                   Avg.Risk  5.0    4.4                                2X Avg.Risk  9.6    7.1                                3X Avg.Risk 23.4   11.0   . Hgb A1c MFr Bld 04/07/2015 6.4* 4.8 - 5.6 % Final   Comment:          Pre-diabetes: 5.7 - 6.4          Diabetes: >6.4          Glycemic control for adults with diabetes: <7.0   . Est. average glucose Bld gHb Est-m* 04/07/2015 137   Final    No results found.   Assessment/Plan   ICD-9-CM ICD-10-CM   1. Neck pain, bilateral 723.1 M54.2 DG Cervical Spine 2 or 3 views  2. Essential hypertension, benign 401.1 I10 albuterol (PROVENTIL HFA;VENTOLIN HFA) 108 (90 Base) MCG/ACT inhaler  3. Chronic atrial fibrillation (HCC) 427.31 I48.2 albuterol (PROVENTIL HFA;VENTOLIN HFA) 108 (90 Base) MCG/ACT inhaler  4. Hyponatremia 276.1 E87.1 Basic Metabolic Panel  5. Hypothyroidism, unspecified hypothyroidism type 244.9 E03.9 TSH  6. Diabetic polyneuropathy associated with type 2 diabetes mellitus (HCC) 250.60 E11.42    357.2    7.  History of stroke V12.54 Z86.73   8. Allergic rhinitis due to pollen 477.0 J30.1   9. Asthma with allergic rhinitis, unspecified asthma severity, uncomplicated 493.00 J45.909     Albuterol neb x 1 given  Continue current medications as ordered  Take tramadol as needed for pain  Use inhaler as needed for chest tightness, wheezing or shortness of breath  Follow up in 2 mos for routine visit. Will need to repeat A1c  Schedule lab appt (nonfasting) - needs repeat lab in 2 weeks  Pearlie Nies S. Ancil Linsey  [facility] [address] [place], [place] [address] [phone] Cell (Monday-Friday 8 AM - 5 PM) [phone] After 5 PM and follow prompts

## 2015-06-16 ENCOUNTER — Telehealth: Payer: Self-pay

## 2015-06-16 ENCOUNTER — Other Ambulatory Visit: Payer: Medicare Other

## 2015-06-16 DIAGNOSIS — E039 Hypothyroidism, unspecified: Secondary | ICD-10-CM

## 2015-06-16 DIAGNOSIS — E871 Hypo-osmolality and hyponatremia: Secondary | ICD-10-CM | POA: Diagnosis not present

## 2015-06-16 NOTE — Telephone Encounter (Signed)
Patient was in office today for labs. Pateint's daughter states patient will cold symptoms: Cough (productive at times-yellow), HA and sinus pressure. Patient is taking zyrtec and using adviar. Patient's daughter would like to know what other OTC medications patient can take that will not interfere with her current medications. Please advise

## 2015-06-16 NOTE — Telephone Encounter (Signed)
She may take Tussin OTC for cough

## 2015-06-17 ENCOUNTER — Other Ambulatory Visit: Payer: Self-pay | Admitting: Internal Medicine

## 2015-06-17 LAB — BASIC METABOLIC PANEL
BUN/Creatinine Ratio: 23 (ref 11–26)
BUN: 33 mg/dL (ref 10–36)
CO2: 20 mmol/L (ref 18–29)
Calcium: 9.4 mg/dL (ref 8.7–10.3)
Chloride: 92 mmol/L — ABNORMAL LOW (ref 96–106)
Creatinine, Ser: 1.42 mg/dL — ABNORMAL HIGH (ref 0.57–1.00)
GFR calc Af Amer: 38 mL/min/{1.73_m2} — ABNORMAL LOW (ref >59)
GFR calc non Af Amer: 33 mL/min/{1.73_m2} — ABNORMAL LOW (ref >59)
Glucose: 156 mg/dL — ABNORMAL HIGH (ref 65–99)
Potassium: 4.1 mmol/L (ref 3.5–5.2)
Sodium: 130 mmol/L — ABNORMAL LOW (ref 134–144)

## 2015-06-17 LAB — TSH: TSH: 0.009 u[IU]/mL — ABNORMAL LOW (ref 0.450–4.500)

## 2015-06-17 NOTE — Telephone Encounter (Signed)
Discussed with Shannon [name] verbalized understanding

## 2015-06-20 ENCOUNTER — Emergency Department (INDEPENDENT_AMBULATORY_CARE_PROVIDER_SITE_OTHER): Payer: Medicare Other

## 2015-06-20 ENCOUNTER — Other Ambulatory Visit: Payer: Self-pay | Admitting: Cardiology

## 2015-06-20 ENCOUNTER — Encounter (HOSPITAL_COMMUNITY): Payer: Self-pay | Admitting: Adult Health

## 2015-06-20 ENCOUNTER — Emergency Department (HOSPITAL_COMMUNITY): Payer: Medicare Other

## 2015-06-20 ENCOUNTER — Encounter (HOSPITAL_COMMUNITY): Payer: Self-pay | Admitting: Emergency Medicine

## 2015-06-20 ENCOUNTER — Emergency Department (HOSPITAL_COMMUNITY)
Admission: EM | Admit: 2015-06-20 | Discharge: 2015-06-20 | Disposition: A | Discharge status: Home / Self Care | Payer: Medicare Other | Attending: Emergency Medicine | Admitting: Emergency Medicine

## 2015-06-20 ENCOUNTER — Emergency Department (INDEPENDENT_AMBULATORY_CARE_PROVIDER_SITE_OTHER)
Admission: EM | Admit: 2015-06-20 | Discharge: 2015-06-20 | Disposition: A | Discharge status: Nursing Home (Includes ICF) | Payer: Medicare Other | Source: Home / Self Care | Attending: Emergency Medicine | Admitting: Emergency Medicine

## 2015-06-20 DIAGNOSIS — F329 Major depressive disorder, single episode, unspecified: Secondary | ICD-10-CM | POA: Insufficient documentation

## 2015-06-20 DIAGNOSIS — R05 Cough: Secondary | ICD-10-CM | POA: Diagnosis not present

## 2015-06-20 DIAGNOSIS — R0602 Shortness of breath: Secondary | ICD-10-CM | POA: Diagnosis not present

## 2015-06-20 DIAGNOSIS — R062 Wheezing: Secondary | ICD-10-CM

## 2015-06-20 DIAGNOSIS — J441 Chronic obstructive pulmonary disease with (acute) exacerbation: Secondary | ICD-10-CM | POA: Insufficient documentation

## 2015-06-20 DIAGNOSIS — Z8701 Personal history of pneumonia (recurrent): Secondary | ICD-10-CM | POA: Diagnosis not present

## 2015-06-20 DIAGNOSIS — Z79899 Other long term (current) drug therapy: Secondary | ICD-10-CM | POA: Insufficient documentation

## 2015-06-20 DIAGNOSIS — E119 Type 2 diabetes mellitus without complications: Secondary | ICD-10-CM | POA: Diagnosis not present

## 2015-06-20 DIAGNOSIS — Z7982 Long term (current) use of aspirin: Secondary | ICD-10-CM | POA: Insufficient documentation

## 2015-06-20 DIAGNOSIS — J40 Bronchitis, not specified as acute or chronic: Secondary | ICD-10-CM

## 2015-06-20 DIAGNOSIS — I10 Essential (primary) hypertension: Secondary | ICD-10-CM | POA: Diagnosis not present

## 2015-06-20 DIAGNOSIS — E039 Hypothyroidism, unspecified: Secondary | ICD-10-CM | POA: Diagnosis not present

## 2015-06-20 DIAGNOSIS — K219 Gastro-esophageal reflux disease without esophagitis: Secondary | ICD-10-CM | POA: Insufficient documentation

## 2015-06-20 DIAGNOSIS — J069 Acute upper respiratory infection, unspecified: Secondary | ICD-10-CM | POA: Insufficient documentation

## 2015-06-20 DIAGNOSIS — Z862 Personal history of diseases of the blood and blood-forming organs and certain disorders involving the immune mechanism: Secondary | ICD-10-CM | POA: Insufficient documentation

## 2015-06-20 DIAGNOSIS — E785 Hyperlipidemia, unspecified: Secondary | ICD-10-CM | POA: Insufficient documentation

## 2015-06-20 DIAGNOSIS — Z7951 Long term (current) use of inhaled steroids: Secondary | ICD-10-CM | POA: Insufficient documentation

## 2015-06-20 LAB — CBC WITH DIFFERENTIAL/PLATELET
Basophils Absolute: 0.1 10*3/uL (ref 0.0–0.1)
Basophils Relative: 1 %
Eosinophils Absolute: 0.5 10*3/uL (ref 0.0–0.7)
Eosinophils Relative: 10 %
HCT: 30.8 % — ABNORMAL LOW (ref 36.0–46.0)
Hemoglobin: 10.5 g/dL — ABNORMAL LOW (ref 12.0–15.0)
Lymphocytes Relative: 25 %
Lymphs Abs: 1.2 10*3/uL (ref 0.7–4.0)
MCH: 30.2 pg (ref 26.0–34.0)
MCHC: 34.1 g/dL (ref 30.0–36.0)
MCV: 88.5 fL (ref 78.0–100.0)
Monocytes Absolute: 0.8 10*3/uL (ref 0.1–1.0)
Monocytes Relative: 17 %
Neutro Abs: 2.3 10*3/uL (ref 1.7–7.7)
Neutrophils Relative %: 47 %
Platelets: 178 10*3/uL (ref 150–400)
RBC: 3.48 MIL/uL — ABNORMAL LOW (ref 3.87–5.11)
RDW: 14.6 % (ref 11.5–15.5)
WBC: 4.9 10*3/uL (ref 4.0–10.5)

## 2015-06-20 LAB — COMPREHENSIVE METABOLIC PANEL
ALT: 43 U/L (ref 14–54)
AST: 47 U/L — ABNORMAL HIGH (ref 15–41)
Albumin: 3.5 g/dL (ref 3.5–5.0)
Alkaline Phosphatase: 60 U/L (ref 38–126)
Anion gap: 11 (ref 5–15)
BUN: 36 mg/dL — ABNORMAL HIGH (ref 6–20)
CO2: 22 mmol/L (ref 22–32)
Calcium: 9.7 mg/dL (ref 8.9–10.3)
Chloride: 103 mmol/L (ref 101–111)
Creatinine, Ser: 1.42 mg/dL — ABNORMAL HIGH (ref 0.44–1.00)
GFR calc Af Amer: 36 mL/min — ABNORMAL LOW (ref >60)
GFR calc non Af Amer: 31 mL/min — ABNORMAL LOW (ref >60)
Glucose, Bld: 136 mg/dL — ABNORMAL HIGH (ref 65–99)
Potassium: 4.1 mmol/L (ref 3.5–5.1)
Sodium: 136 mmol/L (ref 135–145)
Total Bilirubin: 0.3 mg/dL (ref 0.3–1.2)
Total Protein: 7.2 g/dL (ref 6.5–8.1)

## 2015-06-20 LAB — I-STAT CG4 LACTIC ACID, ED
Lactic Acid, Venous: 2.03 mmol/L (ref 0.5–2.0)
Lactic Acid, Venous: 1.71 mmol/L (ref 0.5–2.0)

## 2015-06-20 LAB — INFLUENZA PANEL BY PCR (TYPE A & B)
H1N1 flu by pcr: NOT DETECTED
Influenza A By PCR: POSITIVE — AB
Influenza B By PCR: NEGATIVE

## 2015-06-20 LAB — I-STAT TROPONIN, ED: Troponin i, poc: 0.03 ng/mL (ref 0.00–0.08)

## 2015-06-20 LAB — BRAIN NATRIURETIC PEPTIDE: B Natriuretic Peptide: 238.3 pg/mL — ABNORMAL HIGH (ref 0.0–100.0)

## 2015-06-20 MED ORDER — ALBUTEROL SULFATE (2.5 MG/3ML) 0.083% IN NEBU
5.0000 mg | INHALATION_SOLUTION | RESPIRATORY_TRACT | Status: DC
  Filled 2015-06-20: qty 6

## 2015-06-20 MED ORDER — IPRATROPIUM-ALBUTEROL 0.5-2.5 (3) MG/3ML IN SOLN
RESPIRATORY_TRACT | Status: AC
  Filled 2015-06-20: qty 3

## 2015-06-20 MED ORDER — METHYLPREDNISOLONE SODIUM SUCC 125 MG IJ SOLR
125.0000 mg | Freq: Once | INTRAMUSCULAR | Status: AC
  Administered 2015-06-20: 125 mg via INTRAVENOUS
  Filled 2015-06-20: qty 2

## 2015-06-20 MED ORDER — IPRATROPIUM-ALBUTEROL 0.5-2.5 (3) MG/3ML IN SOLN
3.0000 mL | Freq: Once | RESPIRATORY_TRACT | Status: AC
  Administered 2015-06-20: 3 mL via RESPIRATORY_TRACT

## 2015-06-20 MED ORDER — GUAIFENESIN ER 600 MG PO TB12: 600.0000 mg | ORAL_TABLET | Freq: Two times a day (BID) | ORAL | Status: DC

## 2015-06-20 MED ORDER — ALBUTEROL SULFATE (2.5 MG/3ML) 0.083% IN NEBU
5.0000 mg | INHALATION_SOLUTION | RESPIRATORY_TRACT | Status: DC | PRN
  Administered 2015-06-20: 5 mg via RESPIRATORY_TRACT

## 2015-06-20 MED ORDER — IPRATROPIUM BROMIDE 0.02 % IN SOLN
0.5000 mg | Freq: Once | RESPIRATORY_TRACT | Status: AC
  Administered 2015-06-20: 0.5 mg via RESPIRATORY_TRACT
  Filled 2015-06-20: qty 2.5

## 2015-06-20 MED ORDER — PREDNISONE 10 MG (21) PO TBPK: ORAL_TABLET | ORAL | Status: DC

## 2015-06-20 NOTE — ED Notes (Addendum)
Presents with SOB, productive cough dark yellow in color, increased work of breathing, denies fever, endorses head, neck, eye and chest pain. Began Friday. Sats 100%, right breath sounds diminished. Pain in chest radiates into arm ands shoulder-pt also states she has pain all over.  PT has taken 2 nebulizers with no relief, one at [facility] and one at her Doctors office

## 2015-06-20 NOTE — ED Notes (Signed)
Pt ambulated in hallway with RN using home front wheel walker with no acute distress; pt gait steady; Md observed pt walking in hallway

## 2015-06-20 NOTE — Discharge Instructions (Signed)

## 2015-06-20 NOTE — Discharge Instructions (Signed)
Please take her directly to the emergency room. We will call and notify the triage nurse so they will be expecting you.

## 2015-06-20 NOTE — ED Notes (Signed)
C/o cold sx onset x6 days associated w/HA, neck pain, cough and wheezing Denies fevers... Daughter is also being seen for similar sx A&O x4... No acute distress.

## 2015-06-20 NOTE — ED Provider Notes (Signed)
CSN: [id number]     Arrival date & time 06/20/15  1741 History   First MD Initiated Contact with Patient 06/20/15 1841     Chief Complaint  Patient presents with  . Shortness of Breath    HPI Pt was seen at the urgent care this evening and send down to the ED.  Per Dr Veverly Fells note "She is a 82 year old woman here for evaluation of cough. Her daughter is being seen in another room for similar symptoms. She states for the last week she has had sinus congestion, sore throat, cough, and wheezing. She does report feeling short of breath. She also describes intermittent tightness in her chest. She states her appetite has been fine. She does report feeling weak and achy, so her daughter has been pureing her food. She did get a flu shot this year"  These symptoms started this week.  No fevers. At the urgent care she was noted to be dyspneic and wheezing so she was sdent to the ED. Past Medical History  Diagnosis Date  . Pneumonia   . Hyperlipidemia   . Hypertension   . Small bowel obstruction ([place])     "I don't know how many" (01/11/2015)  . Ventral hernia with bowel obstruction   . Asthma   . Hyperlipidemia   . Stroke ([place])     "light one"  . COPD (chronic obstructive pulmonary disease) ([place])   . Hypothyroidism   . Type II diabetes mellitus ([place])   . Anemia   . History of blood transfusion   . GERD (gastroesophageal reflux disease)   . History of stomach ulcers   . Arthritis     "all over"  . Renal insufficiency   . Depression     "light case"  . SIADH (syndrome of inappropriate ADH production) ([place])     [name] 01/10/2015   Past Surgical History  Procedure Laterality Date  . Tubal ligation    . Colectomy    . Hernia repair  2015  . Esophagogastroduodenoscopy N/A 01/19/2014    Procedure: ESOPHAGOGASTRODUODENOSCOPY (EGD);  Surgeon: Irene Shipper, MD;  Location: [facility];  Service: Endoscopy;  Laterality: N/A;  . Esophagogastroduodenoscopy N/A 01/20/2014    Procedure:  ESOPHAGOGASTRODUODENOSCOPY (EGD);  Surgeon: Irene Shipper, MD;  Location: [facility];  Service: Endoscopy;  Laterality: N/A;  . Esophagogastroduodenoscopy N/A 03/19/2014    Procedure: ESOPHAGOGASTRODUODENOSCOPY (EGD);  Surgeon: Milus Banister, MD;  Location: [facility];  Service: Endoscopy;  Laterality: N/A;  . Cholecystectomy open    . Ventral hernia repair  2015    incarcerated ventral hernia ([facility] 09/2013)/notes 01/10/2015  . Cataract extraction w/ intraocular lens  implant, bilateral    . Gastrojejunostomy      hx/notes 01/10/2015  . Laparotomy N/A 01/20/2015    Procedure: EXPLORATORY LAPAROTOMY;  Surgeon: Coralie Keens, MD;  Location: [facility];  Service: General;  Laterality: N/A;  . Lysis of adhesion N/A 01/20/2015    Procedure: LYSIS OF ADHESIONS < 1 HOUR;  Surgeon: Coralie Keens, MD;  Location: [facility];  Service: General;  Laterality: N/A;   Family History  Problem Relation Age of Onset  . Stroke Mother   . Heart attack Neg Hx   . Hypertension Mother   . Diabetes Brother    Social History  Substance Use Topics  . Smoking status: Never Smoker   . Smokeless tobacco: Former Systems developer    Types: Snuff  . Alcohol Use: No   OB History    No data available  Review of Systems    Allergies  Review of patient's allergies indicates no known allergies.  Home Medications   Prior to Admission medications   Medication Sig Start Date End Date Taking? Authorizing Provider  acetaminophen (TYLENOL) 325 MG tablet Take 650 mg by mouth every 6 (six) hours as needed for mild pain or fever.   Yes Historical Provider, MD  ADVAIR DISKUS 250-50 MCG/DOSE AEPB INHALE 1 PUFF INTO THE LUNGS 2 (TWO) TIMES DAILY AS NEEDED FOR SHORTNESS OF BREATH 12/01/14  Yes Historical Provider, MD  albuterol (PROVENTIL HFA;VENTOLIN HFA) 108 (90 Base) MCG/ACT inhaler Inhale 2 puffs into the lungs every 6 (six) hours as needed for wheezing or shortness of breath. 06/04/15  Yes Monica Carter, DO  amLODipine (NORVASC)  10 MG tablet TAKE 1 TABLET BY MOUTH DAILY FOR HYPERTENSION 03/15/15  Yes Gildardo Cranker, DO  aspirin EC 81 MG tablet Take 81 mg by mouth every morning.   Yes Historical Provider, MD  atorvastatin (LIPITOR) 10 MG tablet TAKE 1 TABLET BY MOUTH EVERY DAY Patient taking differently: TAKE 1 TABLET BY MOUTH EVERY DAY at 2pm 04/08/15  Yes Gildardo Cranker, DO  b complex vitamins tablet Take 1 tablet by mouth daily.    Yes Historical Provider, MD  calcium-vitamin D (OSCAL WITH D) 500-200 MG-UNIT per tablet Take 1 tablet by mouth daily with breakfast.    Yes Historical Provider, MD  chlorhexidine (PERIDEX) 0.12 % solution 15 mLs by Mouth Rinse route 2 (two) times daily. Patient taking differently: 15 mLs by Mouth Rinse route every evening.  03/26/14  Yes Robbie Lis, MD  cloNIDine (CATAPRES) 0.1 MG tablet Take 1 tablet (0.1 mg total) by mouth 2 (two) times daily. 07/13/14  Yes Dorothy Spark, MD  diclofenac sodium (VOLTAREN) 1 % GEL Apply 2 g topically daily as needed (for pain).    Yes Historical Provider, MD  feeding supplement (BOOST / RESOURCE BREEZE) LIQD Take 1 Container by mouth 3 (three) times daily between meals. 01/27/15  Yes Shanker Kristeen Mans, MD  fluticasone (FLONASE) 50 MCG/ACT nasal spray Place 2 sprays into both nostrils daily. 08/07/14  Yes Gildardo Cranker, DO  gabapentin (NEURONTIN) 300 MG capsule TAKE 1 CAPSULE (300 MG TOTAL) BY MOUTH 3 (THREE) TIMES DAILY. 05/31/15  Yes Gildardo Cranker, DO  hydrALAZINE (APRESOLINE) 100 MG tablet TAKE 1 TABLET (100 MG TOTAL) BY MOUTH 3 (THREE) TIMES DAILY. 05/31/15  Yes Gildardo Cranker, DO  isosorbide mononitrate (IMDUR) 30 MG 24 hr tablet Take one tablet by mouth once daily. If Chest Pains occur increase to two tablets daily. Patient taking differently: Take 30-60 mg by mouth See admin instructions. Take one tablet by mouth once daily. If Chest Pains occur increase to two tablets daily. 07/22/14  Yes Monica Carter, DO  lactulose (CHRONULAC) 10 GM/15ML solution Take 15  mLs (10 g total) by mouth daily as needed for mild constipation. 06/02/14  Yes Mahima Pandey, MD  latanoprost (XALATAN) 0.005 % ophthalmic solution Place 1 drop into both eyes at bedtime. 06/02/14  Yes Mahima Pandey, MD  levothyroxine (SYNTHROID, LEVOTHROID) 100 MCG tablet TAKE 1 TABLET (100 MCG TOTAL) BY MOUTH DAILY. TAKE WITH 125MCG DOSE (TOTAL 225MCG) Patient taking differently: TAKE 2 TABLET (200 MCG TOTAL) BY MOUTH DAILY. 03/15/15  Yes Gildardo Cranker, DO  metoprolol succinate (TOPROL-XL) 25 MG 24 hr tablet TAKE 1 TABLET (25 MG TOTAL) BY MOUTH DAILY. 06/17/15  Yes [name], NP  Multiple Vitamins-Minerals (MULTIVITAMIN WITH MINERALS) tablet Take 1 tablet by mouth  every morning.    Yes Historical Provider, MD  pantoprazole (PROTONIX) 40 MG tablet TAKE 1 TABLET BY MOUTH TWICE A DAY 08/07/14  Yes Gildardo Cranker, DO  Polyethyl Glycol-Propyl Glycol 0.4-0.3 % SOLN Place 1 drop into both eyes 4 (four) times daily.    Yes Historical Provider, MD  polyethylene glycol powder (GLYCOLAX/MIRALAX) powder Take 17 g by mouth 2 (two) times daily as needed. Patient taking differently: Take 17 g by mouth daily.  03/12/15  Yes Monica Carter, DO  RESTASIS 0.05 % ophthalmic emulsion USE 1 DROP INTO BOTH EYES TWICE DAILY 10/19/14  Yes Gildardo Cranker, DO  sennosides-docusate sodium (SENOKOT-S) 8.6-50 MG tablet Take 1 tablet by mouth 2 (two) times daily.    Yes Historical Provider, MD  sertraline (ZOLOFT) 100 MG tablet TAKE ONE TABLET BY MOUTH ONCE DAILY FOR DEPRESSION Patient taking differently: TAKE ONE TABLET BY MOUTH ONCE DAILY IN MORNING FOR DEPRESSION 03/16/15  Yes Estill Dooms, MD  simethicone (MYLICON) 80 MG chewable tablet Chew 1 tablet (80 mg total) by mouth 4 (four) times daily as needed for flatulence. 04/07/15  Yes Gildardo Cranker, DO  sodium chloride 1 g tablet TAKE 1 TABLET BY MOUTH THREE TIMES DAILY 05/19/15  Yes Gildardo Cranker, DO  traMADol (ULTRAM) 50 MG tablet Take 50 mg by mouth 2 (two) times daily as needed  for moderate pain.    Yes Historical Provider, MD  guaiFENesin (MUCINEX) 600 MG 12 hr tablet Take 1 tablet (600 mg total) by mouth 2 (two) times daily. 06/20/15   Dorie Rank, MD  levothyroxine (SYNTHROID, LEVOTHROID) 125 MCG tablet Take 1 tablet (125 mcg total) by mouth daily before breakfast. Take with 130mcg dose (total 269mcg) 11/06/14   Gildardo Cranker, DO  predniSONE (STERAPRED UNI-PAK 21 TAB) 10 MG (21) TBPK tablet Take 6 tabs by mouth daily  for 2 days, then 5 tabs for 2 days, then 4 tabs for 2 days, then 3 tabs for 2 days, 2 tabs for 2 days, then 1 tab by mouth daily for 2 days 06/20/15   Dorie Rank, MD   BP 187/62 mmHg  Pulse 81  Resp 20  Wt 63.504 kg  SpO2 96% Physical Exam  Constitutional: No distress.  Elderly, frail  HENT:  Head: Normocephalic and atraumatic.  Right Ear: External ear normal.  Left Ear: External ear normal.  Eyes: Conjunctivae are normal. Right eye exhibits no discharge. Left eye exhibits no discharge. No scleral icterus.  Neck: Neck supple. No tracheal deviation present.  Cardiovascular: Normal rate, regular rhythm and intact distal pulses.   Pulmonary/Chest: Accessory muscle usage present. No stridor. Tachypnea noted. No respiratory distress. She has wheezes. She has no rales.  Abdominal: Soft. Bowel sounds are normal. She exhibits no distension. There is no tenderness. There is no rebound and no guarding.  Musculoskeletal: She exhibits no edema or tenderness.  Neurological: She is alert. She has normal strength. No cranial nerve deficit (no facial droop, extraocular movements intact, no slurred speech) or sensory deficit. She exhibits normal muscle tone. She displays no seizure activity. Coordination normal.  Skin: Skin is warm and dry. No rash noted.  Psychiatric: She has a normal mood and affect.  Nursing note and vitals reviewed.   ED Course  Procedures (including critical care time) Labs Review Labs Reviewed  CBC WITH DIFFERENTIAL/PLATELET - Abnormal;  Notable for the following:    RBC 3.48 (*)    Hemoglobin 10.5 (*)    HCT 30.8 (*)    All other  components within normal limits  COMPREHENSIVE METABOLIC PANEL - Abnormal; Notable for the following:    Glucose, Bld 136 (*)    BUN 36 (*)    Creatinine, Ser 1.42 (*)    AST 47 (*)    GFR calc non Af Amer 31 (*)    GFR calc Af Amer 36 (*)    All other components within normal limits  BRAIN NATRIURETIC PEPTIDE - Abnormal; Notable for the following:    B Natriuretic Peptide 238.3 (*)    All other components within normal limits  I-STAT CG4 LACTIC ACID, ED - Abnormal; Notable for the following:    Lactic Acid, Venous 2.03 (*)    All other components within normal limits  INFLUENZA PANEL BY PCR (TYPE A & B, H1N1)  I-STAT TROPOININ, ED  I-STAT CG4 LACTIC ACID, ED    Imaging Review Dg Chest 2 View  06/20/2015  CLINICAL DATA:  Shortness of breath and cough EXAM: CHEST  2 VIEW COMPARISON:  06/20/2015, 1625 hours FINDINGS: Cardiac shadow is again mildly enlarged. Lungs are well aerated bilaterally. No acute bony abnormality is seen. Stable compression deformity is noted in the lower thoracic spine. IMPRESSION: No acute abnormality noted.  No change from 2 hours previous Electronically Signed   By: Inez Catalina M.D.   On: 06/20/2015 18:42   Dg Chest 2 View  06/20/2015  CLINICAL DATA:  Pt here with cough that started last week, hx of bronchitis last year EXAM: CHEST  2 VIEW COMPARISON:  01/27/2015 FINDINGS: The heart is enlarged. No focal consolidations or pleural effusions. Stable elevation of right hemidiaphragm. Surgical clips are noted in the upper abdomen. IMPRESSION: Stable cardiomegaly. Electronically Signed   By: Nolon Nations M.D.   On: 06/20/2015 17:44   I have personally reviewed and evaluated these images and lab results as part of my medical decision-making.   EKG Interpretation   Date/Time:  Sunday June 20 2015 17:58:21 EDT Ventricular Rate:  72 PR Interval:  144 QRS Duration:  70 QT Interval:  392 QTC Calculation: 429 R Axis:   77 Text Interpretation:  Normal sinus rhythm Possible Anterior infarct , age  undetermined Abnormal ECG No significant change since last tracing  Confirmed by Venora Kautzman  MD-J, Tarini Carrier [phone]) on 06/20/2015 10:59:32 PM     Medications  albuterol (PROVENTIL) (2.5 MG/3ML) 0.083% nebulizer solution 5 mg (5 mg Nebulization Given 06/20/15 1905)  ipratropium (ATROVENT) nebulizer solution 0.5 mg (0.5 mg Nebulization Given 06/20/15 1905)  methylPREDNISolone sodium succinate (SOLU-MEDROL) 125 mg/2 mL injection 125 mg (125 mg Intravenous Given 06/20/15 2005)    MDM   Final diagnoses:  Chronic obstructive pulmonary disease with acute exacerbation (HCC)  URI (upper respiratory infection)   Pt presented to the ED with cough, congestion and wheezing.  Her symptoms improved with breathing treatments and steroids.   Lab tests are reassuring.  CXR without pna.    Pt was able to walk around the ED without dyspnea.   Will dc home on oral steroid taper and mucinex.  Follow up with pcp this week   Dorie Rank, MD 06/20/15 [phone]

## 2015-06-20 NOTE — ED Notes (Signed)
Declined Shuttle service.

## 2015-06-20 NOTE — ED Provider Notes (Signed)
CSN: [id number]     Arrival date & time 06/20/15  1439 History   First MD Initiated Contact with Patient 06/20/15 1557     Chief Complaint  Patient presents with  . URI   (Consider location/radiation/quality/duration/timing/severity/associated sxs/prior Treatment) HPI  She is a 82 year old woman here for evaluation of cough. Her daughter is being seen in another room for similar symptoms. She states for the last week she has had sinus congestion, sore throat, cough, and wheezing. She does report feeling short of breath. She also describes intermittent tightness in her chest. She states her appetite has been fine. She does report feeling weak and achy, so her daughter has been pureing her food. She did get a flu shot this year.  Past Medical History  Diagnosis Date  . Pneumonia   . Hyperlipidemia   . Hypertension   . Small bowel obstruction ([place])     "I don't know how many" (01/11/2015)  . Ventral hernia with bowel obstruction   . Asthma   . Hyperlipidemia   . Stroke ([place])     "light one"  . COPD (chronic obstructive pulmonary disease) ([place])   . Hypothyroidism   . Type II diabetes mellitus ([place])   . Anemia   . History of blood transfusion   . GERD (gastroesophageal reflux disease)   . History of stomach ulcers   . Arthritis     "all over"  . Renal insufficiency   . Depression     "light case"  . SIADH (syndrome of inappropriate ADH production) ([place])     [name] 01/10/2015   Past Surgical History  Procedure Laterality Date  . Tubal ligation    . Colectomy    . Hernia repair  2015  . Esophagogastroduodenoscopy N/A 01/19/2014    Procedure: ESOPHAGOGASTRODUODENOSCOPY (EGD);  Surgeon: Irene Shipper, MD;  Location: [facility];  Service: Endoscopy;  Laterality: N/A;  . Esophagogastroduodenoscopy N/A 01/20/2014    Procedure: ESOPHAGOGASTRODUODENOSCOPY (EGD);  Surgeon: Irene Shipper, MD;  Location: [facility];  Service: Endoscopy;  Laterality: N/A;  . Esophagogastroduodenoscopy  N/A 03/19/2014    Procedure: ESOPHAGOGASTRODUODENOSCOPY (EGD);  Surgeon: Milus Banister, MD;  Location: [facility];  Service: Endoscopy;  Laterality: N/A;  . Cholecystectomy open    . Ventral hernia repair  2015    incarcerated ventral hernia ([facility] 09/2013)/notes 01/10/2015  . Cataract extraction w/ intraocular lens  implant, bilateral    . Gastrojejunostomy      hx/notes 01/10/2015  . Laparotomy N/A 01/20/2015    Procedure: EXPLORATORY LAPAROTOMY;  Surgeon: Coralie Keens, MD;  Location: [facility];  Service: General;  Laterality: N/A;  . Lysis of adhesion N/A 01/20/2015    Procedure: LYSIS OF ADHESIONS < 1 HOUR;  Surgeon: Coralie Keens, MD;  Location: [facility];  Service: General;  Laterality: N/A;   Family History  Problem Relation Age of Onset  . Stroke Mother   . Heart attack Neg Hx   . Hypertension Mother   . Diabetes Brother    Social History  Substance Use Topics  . Smoking status: Never Smoker   . Smokeless tobacco: Former Systems developer    Types: Snuff  . Alcohol Use: No   OB History    No data available     Review of Systems As in history of present illness Allergies  Review of patient's allergies indicates no known allergies.  Home Medications   Prior to Admission medications   Medication Sig Start Date End Date Taking? Authorizing Provider  ADVAIR  DISKUS 250-50 MCG/DOSE AEPB INHALE 1 PUFF INTO THE LUNGS 2 (TWO) TIMES DAILY AS NEEDED FOR SHORTNESS OF BREATH 12/01/14  Yes Historical Provider, MD  amLODipine (NORVASC) 10 MG tablet TAKE 1 TABLET BY MOUTH DAILY FOR HYPERTENSION 03/15/15  Yes Gildardo Cranker, DO  aspirin EC 81 MG tablet Take 81 mg by mouth every morning.   Yes Historical Provider, MD  atorvastatin (LIPITOR) 10 MG tablet TAKE 1 TABLET BY MOUTH EVERY DAY 04/08/15  Yes Gildardo Cranker, DO  b complex vitamins tablet Take 1 tablet by mouth 2 (two) times daily.   Yes Historical Provider, MD  calcium-vitamin D (OSCAL WITH D) 500-200 MG-UNIT per tablet Take 1 tablet by mouth  daily with breakfast.    Yes Historical Provider, MD  chlorhexidine (PERIDEX) 0.12 % solution 15 mLs by Mouth Rinse route 2 (two) times daily. 03/26/14  Yes Robbie Lis, MD  cloNIDine (CATAPRES) 0.1 MG tablet Take 1 tablet (0.1 mg total) by mouth 2 (two) times daily. 07/13/14  Yes Dorothy Spark, MD  feeding supplement (BOOST / RESOURCE BREEZE) LIQD Take 1 Container by mouth 3 (three) times daily between meals. 01/27/15  Yes Shanker Kristeen Mans, MD  fluticasone (FLONASE) 50 MCG/ACT nasal spray Place 2 sprays into both nostrils daily. 08/07/14  Yes Gildardo Cranker, DO  gabapentin (NEURONTIN) 300 MG capsule TAKE 1 CAPSULE (300 MG TOTAL) BY MOUTH 3 (THREE) TIMES DAILY. 05/31/15  Yes Gildardo Cranker, DO  hydrALAZINE (APRESOLINE) 100 MG tablet TAKE 1 TABLET (100 MG TOTAL) BY MOUTH 3 (THREE) TIMES DAILY. 05/31/15  Yes Gildardo Cranker, DO  isosorbide mononitrate (IMDUR) 30 MG 24 hr tablet Take one tablet by mouth once daily. If Chest Pains occur increase to two tablets daily. Patient taking differently: Take 30-60 mg by mouth daily. Take one tablet by mouth once daily. If Chest Pains occur increase to two tablets daily. 07/22/14  Yes Monica Carter, DO  latanoprost (XALATAN) 0.005 % ophthalmic solution Place 1 drop into both eyes at bedtime. 06/02/14  Yes Mahima Pandey, MD  levothyroxine (SYNTHROID, LEVOTHROID) 100 MCG tablet TAKE 1 TABLET (100 MCG TOTAL) BY MOUTH DAILY. TAKE WITH 125MCG DOSE (TOTAL 225MCG) 03/15/15  Yes Gildardo Cranker, DO  metoprolol succinate (TOPROL-XL) 25 MG 24 hr tablet TAKE 1 TABLET (25 MG TOTAL) BY MOUTH DAILY. 06/17/15  Yes [name], NP  Multiple Vitamins-Minerals (MULTIVITAMIN WITH MINERALS) tablet Take 1 tablet by mouth every morning.    Yes Historical Provider, MD  pantoprazole (PROTONIX) 40 MG tablet TAKE 1 TABLET BY MOUTH TWICE A DAY 08/07/14  Yes Gildardo Cranker, DO  Polyethyl Glycol-Propyl Glycol 0.4-0.3 % SOLN Place 1 drop into both eyes 4 (four) times daily.    Yes Historical  Provider, MD  polyethylene glycol powder (GLYCOLAX/MIRALAX) powder Take 17 g by mouth 2 (two) times daily as needed. 03/12/15  Yes Monica Carter, DO  RESTASIS 0.05 % ophthalmic emulsion USE 1 DROP INTO BOTH EYES TWICE DAILY 10/19/14  Yes Gildardo Cranker, DO  sennosides-docusate sodium (SENOKOT-S) 8.6-50 MG tablet Take 1 tablet by mouth 2 (two) times daily.    Yes Historical Provider, MD  sertraline (ZOLOFT) 100 MG tablet TAKE ONE TABLET BY MOUTH ONCE DAILY FOR DEPRESSION 03/16/15  Yes Estill Dooms, MD  acetaminophen (TYLENOL) 325 MG tablet Take 650 mg by mouth every 6 (six) hours as needed for mild pain or fever.    Historical Provider, MD  albuterol (PROVENTIL HFA;VENTOLIN HFA) 108 (90 Base) MCG/ACT inhaler Inhale 2 puffs into the lungs  every 6 (six) hours as needed for wheezing or shortness of breath. 06/04/15   Gildardo Cranker, DO  diclofenac sodium (VOLTAREN) 1 % GEL Apply 2 g topically daily. 2 times daily on the area.    Historical Provider, MD  lactulose (CHRONULAC) 10 GM/15ML solution Take 15 mLs (10 g total) by mouth daily as needed for mild constipation. 06/02/14   Blanchie Serve, MD  levothyroxine (SYNTHROID, LEVOTHROID) 125 MCG tablet Take 1 tablet (125 mcg total) by mouth daily before breakfast. Take with 115mcg dose (total 27mcg) 11/06/14   Gildardo Cranker, DO  simethicone (MYLICON) 80 MG chewable tablet Chew 1 tablet (80 mg total) by mouth 4 (four) times daily as needed for flatulence. 04/07/15   Gildardo Cranker, DO  sodium chloride 1 g tablet TAKE 1 TABLET BY MOUTH THREE TIMES DAILY Patient not taking: Reported on 06/04/2015 05/19/15   Gildardo Cranker, DO  traMADol (ULTRAM) 50 MG tablet Take by mouth 2 (two) times daily as needed for moderate pain.    Historical Provider, MD   Meds Ordered and Administered this Visit   Medications  ipratropium-albuterol (DUONEB) 0.5-2.5 (3) MG/3ML nebulizer solution 3 mL (3 mLs Nebulization Given 06/20/15 1659)    BP 123/49 mmHg  Pulse 67  Temp(Src) 98.7 F (37.1  C) (Oral)  Resp 20  SpO2 96% No data found.   Physical Exam  Constitutional:  Elderly woman, frail appearing.  HENT:  Mouth/Throat: Oropharynx is clear and moist. No oropharyngeal exudate.  TMs normal bilaterally. Nasal mucosa slightly inflamed.  Neck: Neck supple.  Cardiovascular: Normal rate, regular rhythm and normal heart sounds.   No murmur heard. Pulmonary/Chest: She has wheezes. She has no rales.  Appears somewhat short of breath. Speaking in short sentences. No increased work of breathing.  Lymphadenopathy:    She has no cervical adenopathy.    ED Course  Procedures (including critical care time)  Labs Review Labs Reviewed - No data to display  Imaging Review No results found.  No obvious pneumonia on x-ray.  MDM   1. Shortness of breath   2. Wheezing   3. Bronchitis    She continues to feel short of breath and have a lot of coarseness after the breathing treatment. The wheezing is somewhat improved.  Given her age and shortness of breath, will transfer to [facility] ED via private vehicle for additional workup and evaluation. Her daughter is present with her and will take her to the emergency room.    Melony Overly, MD 06/20/15 1719

## 2015-06-22 NOTE — Telephone Encounter (Signed)
received request for isosorbide and it looks like Dr. Meda Coffee prescribed this, please advise if ok to fill.Shannon [name]

## 2015-06-28 ENCOUNTER — Other Ambulatory Visit: Payer: Self-pay | Admitting: Internal Medicine

## 2015-07-02 ENCOUNTER — Other Ambulatory Visit: Payer: Self-pay | Admitting: Cardiology

## 2015-07-04 ENCOUNTER — Other Ambulatory Visit: Payer: Self-pay | Admitting: Internal Medicine

## 2015-07-05 ENCOUNTER — Inpatient Hospital Stay (HOSPITAL_COMMUNITY): Payer: Medicare Other

## 2015-07-05 ENCOUNTER — Inpatient Hospital Stay (HOSPITAL_COMMUNITY)
Admission: EM | Admit: 2015-07-05 | Discharge: 2015-07-11 | DRG: 368 | Disposition: A | Discharge status: Home / Self Care | Payer: Medicare Other | Attending: Internal Medicine | Admitting: Internal Medicine

## 2015-07-05 ENCOUNTER — Other Ambulatory Visit: Payer: Self-pay | Admitting: *Deleted

## 2015-07-05 ENCOUNTER — Encounter (HOSPITAL_COMMUNITY): Payer: Self-pay | Admitting: *Deleted

## 2015-07-05 ENCOUNTER — Emergency Department (HOSPITAL_COMMUNITY): Payer: Medicare Other

## 2015-07-05 DIAGNOSIS — Z9119 Patient's noncompliance with other medical treatment and regimen: Secondary | ICD-10-CM

## 2015-07-05 DIAGNOSIS — Z7951 Long term (current) use of inhaled steroids: Secondary | ICD-10-CM | POA: Diagnosis not present

## 2015-07-05 DIAGNOSIS — Z833 Family history of diabetes mellitus: Secondary | ICD-10-CM | POA: Diagnosis not present

## 2015-07-05 DIAGNOSIS — Z79899 Other long term (current) drug therapy: Secondary | ICD-10-CM | POA: Diagnosis not present

## 2015-07-05 DIAGNOSIS — B3781 Candidal esophagitis: Secondary | ICD-10-CM | POA: Diagnosis present

## 2015-07-05 DIAGNOSIS — K210 Gastro-esophageal reflux disease with esophagitis: Secondary | ICD-10-CM | POA: Diagnosis present

## 2015-07-05 DIAGNOSIS — J449 Chronic obstructive pulmonary disease, unspecified: Secondary | ICD-10-CM | POA: Diagnosis present

## 2015-07-05 DIAGNOSIS — I129 Hypertensive chronic kidney disease with stage 1 through stage 4 chronic kidney disease, or unspecified chronic kidney disease: Secondary | ICD-10-CM | POA: Diagnosis present

## 2015-07-05 DIAGNOSIS — I214 Non-ST elevation (NSTEMI) myocardial infarction: Secondary | ICD-10-CM | POA: Diagnosis not present

## 2015-07-05 DIAGNOSIS — K311 Adult hypertrophic pyloric stenosis: Secondary | ICD-10-CM | POA: Diagnosis present

## 2015-07-05 DIAGNOSIS — R0600 Dyspnea, unspecified: Secondary | ICD-10-CM | POA: Insufficient documentation

## 2015-07-05 DIAGNOSIS — Z515 Encounter for palliative care: Secondary | ICD-10-CM | POA: Diagnosis not present

## 2015-07-05 DIAGNOSIS — F419 Anxiety disorder, unspecified: Secondary | ICD-10-CM | POA: Diagnosis present

## 2015-07-05 DIAGNOSIS — E0590 Thyrotoxicosis, unspecified without thyrotoxic crisis or storm: Secondary | ICD-10-CM | POA: Diagnosis not present

## 2015-07-05 DIAGNOSIS — I517 Cardiomegaly: Secondary | ICD-10-CM | POA: Diagnosis not present

## 2015-07-05 DIAGNOSIS — I10 Essential (primary) hypertension: Secondary | ICD-10-CM | POA: Diagnosis not present

## 2015-07-05 DIAGNOSIS — I4891 Unspecified atrial fibrillation: Secondary | ICD-10-CM | POA: Diagnosis not present

## 2015-07-05 DIAGNOSIS — E874 Mixed disorder of acid-base balance: Secondary | ICD-10-CM | POA: Diagnosis present

## 2015-07-05 DIAGNOSIS — E860 Dehydration: Secondary | ICD-10-CM | POA: Diagnosis present

## 2015-07-05 DIAGNOSIS — E8770 Fluid overload, unspecified: Secondary | ICD-10-CM | POA: Diagnosis not present

## 2015-07-05 DIAGNOSIS — I2510 Atherosclerotic heart disease of native coronary artery without angina pectoris: Secondary | ICD-10-CM | POA: Diagnosis present

## 2015-07-05 DIAGNOSIS — R0789 Other chest pain: Secondary | ICD-10-CM | POA: Diagnosis not present

## 2015-07-05 DIAGNOSIS — Z7982 Long term (current) use of aspirin: Secondary | ICD-10-CM

## 2015-07-05 DIAGNOSIS — N183 Chronic kidney disease, stage 3 unspecified: Secondary | ICD-10-CM | POA: Diagnosis present

## 2015-07-05 DIAGNOSIS — E1151 Type 2 diabetes mellitus with diabetic peripheral angiopathy without gangrene: Secondary | ICD-10-CM | POA: Diagnosis present

## 2015-07-05 DIAGNOSIS — Z823 Family history of stroke: Secondary | ICD-10-CM

## 2015-07-05 DIAGNOSIS — F329 Major depressive disorder, single episode, unspecified: Secondary | ICD-10-CM | POA: Diagnosis present

## 2015-07-05 DIAGNOSIS — E785 Hyperlipidemia, unspecified: Secondary | ICD-10-CM | POA: Diagnosis present

## 2015-07-05 DIAGNOSIS — R7989 Other specified abnormal findings of blood chemistry: Secondary | ICD-10-CM | POA: Diagnosis not present

## 2015-07-05 DIAGNOSIS — Z7189 Other specified counseling: Secondary | ICD-10-CM | POA: Diagnosis not present

## 2015-07-05 DIAGNOSIS — E1122 Type 2 diabetes mellitus with diabetic chronic kidney disease: Secondary | ICD-10-CM | POA: Diagnosis present

## 2015-07-05 DIAGNOSIS — N179 Acute kidney failure, unspecified: Secondary | ICD-10-CM | POA: Insufficient documentation

## 2015-07-05 DIAGNOSIS — R079 Chest pain, unspecified: Secondary | ICD-10-CM | POA: Diagnosis present

## 2015-07-05 DIAGNOSIS — T381X5A Adverse effect of thyroid hormones and substitutes, initial encounter: Secondary | ICD-10-CM | POA: Diagnosis present

## 2015-07-05 DIAGNOSIS — K449 Diaphragmatic hernia without obstruction or gangrene: Secondary | ICD-10-CM | POA: Diagnosis present

## 2015-07-05 DIAGNOSIS — Z8673 Personal history of transient ischemic attack (TIA), and cerebral infarction without residual deficits: Secondary | ICD-10-CM | POA: Diagnosis present

## 2015-07-05 DIAGNOSIS — Z9049 Acquired absence of other specified parts of digestive tract: Secondary | ICD-10-CM

## 2015-07-05 DIAGNOSIS — R1310 Dysphagia, unspecified: Secondary | ICD-10-CM | POA: Diagnosis not present

## 2015-07-05 DIAGNOSIS — N184 Chronic kidney disease, stage 4 (severe): Secondary | ICD-10-CM | POA: Insufficient documentation

## 2015-07-05 DIAGNOSIS — E0580 Other thyrotoxicosis without thyrotoxic crisis or storm: Secondary | ICD-10-CM | POA: Diagnosis present

## 2015-07-05 DIAGNOSIS — R109 Unspecified abdominal pain: Secondary | ICD-10-CM

## 2015-07-05 DIAGNOSIS — I509 Heart failure, unspecified: Secondary | ICD-10-CM | POA: Diagnosis not present

## 2015-07-05 DIAGNOSIS — R0603 Acute respiratory distress: Secondary | ICD-10-CM | POA: Diagnosis present

## 2015-07-05 DIAGNOSIS — J9601 Acute respiratory failure with hypoxia: Secondary | ICD-10-CM | POA: Diagnosis not present

## 2015-07-05 DIAGNOSIS — Z8249 Family history of ischemic heart disease and other diseases of the circulatory system: Secondary | ICD-10-CM

## 2015-07-05 DIAGNOSIS — Z8711 Personal history of peptic ulcer disease: Secondary | ICD-10-CM

## 2015-07-05 DIAGNOSIS — E039 Hypothyroidism, unspecified: Secondary | ICD-10-CM | POA: Diagnosis present

## 2015-07-05 DIAGNOSIS — R0602 Shortness of breath: Secondary | ICD-10-CM | POA: Insufficient documentation

## 2015-07-05 DIAGNOSIS — I4892 Unspecified atrial flutter: Secondary | ICD-10-CM | POA: Diagnosis not present

## 2015-07-05 DIAGNOSIS — D649 Anemia, unspecified: Secondary | ICD-10-CM | POA: Diagnosis present

## 2015-07-05 DIAGNOSIS — K5669 Other intestinal obstruction: Secondary | ICD-10-CM | POA: Diagnosis not present

## 2015-07-05 LAB — URINALYSIS, ROUTINE W REFLEX MICROSCOPIC
Bilirubin Urine: NEGATIVE
Glucose, UA: NEGATIVE mg/dL
Hgb urine dipstick: NEGATIVE
Ketones, ur: NEGATIVE mg/dL
Leukocytes, UA: NEGATIVE
Nitrite: NEGATIVE
Protein, ur: NEGATIVE mg/dL
Specific Gravity, Urine: 1.008 (ref 1.005–1.030)
pH: 8 (ref 5.0–8.0)

## 2015-07-05 LAB — CBC WITH DIFFERENTIAL/PLATELET
Basophils Absolute: 0 10*3/uL (ref 0.0–0.1)
Basophils Relative: 0 %
Eosinophils Absolute: 0.2 10*3/uL (ref 0.0–0.7)
Eosinophils Relative: 3 %
HCT: 35.6 % — ABNORMAL LOW (ref 36.0–46.0)
Hemoglobin: 12.5 g/dL (ref 12.0–15.0)
Lymphocytes Relative: 20 %
Lymphs Abs: 1.4 10*3/uL (ref 0.7–4.0)
MCH: 31.4 pg (ref 26.0–34.0)
MCHC: 35.1 g/dL (ref 30.0–36.0)
MCV: 89.4 fL (ref 78.0–100.0)
Monocytes Absolute: 0.7 10*3/uL (ref 0.1–1.0)
Monocytes Relative: 11 %
Neutro Abs: 4.7 10*3/uL (ref 1.7–7.7)
Neutrophils Relative %: 66 %
Platelets: 268 10*3/uL (ref 150–400)
RBC: 3.98 MIL/uL (ref 3.87–5.11)
RDW: 16 % — ABNORMAL HIGH (ref 11.5–15.5)
WBC: 7.1 10*3/uL (ref 4.0–10.5)

## 2015-07-05 LAB — BASIC METABOLIC PANEL
Anion gap: 13 (ref 5–15)
BUN: 37 mg/dL — ABNORMAL HIGH (ref 6–20)
CO2: 20 mmol/L — ABNORMAL LOW (ref 22–32)
Calcium: 10.1 mg/dL (ref 8.9–10.3)
Chloride: 105 mmol/L (ref 101–111)
Creatinine, Ser: 1.62 mg/dL — ABNORMAL HIGH (ref 0.44–1.00)
GFR calc Af Amer: 31 mL/min — ABNORMAL LOW (ref >60)
GFR calc non Af Amer: 27 mL/min — ABNORMAL LOW (ref >60)
Glucose, Bld: 101 mg/dL — ABNORMAL HIGH (ref 65–99)
Potassium: 3.9 mmol/L (ref 3.5–5.1)
Sodium: 138 mmol/L (ref 135–145)

## 2015-07-05 LAB — HEPATIC FUNCTION PANEL
ALT: 24 U/L (ref 14–54)
AST: 24 U/L (ref 15–41)
Albumin: 3.8 g/dL (ref 3.5–5.0)
Alkaline Phosphatase: 63 U/L (ref 38–126)
Bilirubin, Direct: 0.2 mg/dL (ref 0.1–0.5)
Indirect Bilirubin: 0.7 mg/dL (ref 0.3–0.9)
Total Bilirubin: 0.9 mg/dL (ref 0.3–1.2)
Total Protein: 7.1 g/dL (ref 6.5–8.1)

## 2015-07-05 LAB — TROPONIN I: Troponin I: <0.03 ng/mL (ref <0.031)

## 2015-07-05 MED ORDER — SODIUM CHLORIDE 0.9 % IV BOLUS (SEPSIS)
500.0000 mL | Freq: Once | INTRAVENOUS | Status: AC
  Administered 2015-07-05: 500 mL via INTRAVENOUS

## 2015-07-05 MED ORDER — FLUTICASONE PROPIONATE 50 MCG/ACT NA SUSP
2.0000 | Freq: Every day | NASAL | Status: DC
  Administered 2015-07-08 – 2015-07-11 (×4): 2 via NASAL
  Filled 2015-07-05: qty 16

## 2015-07-05 MED ORDER — MOMETASONE FURO-FORMOTEROL FUM 200-5 MCG/ACT IN AERO
2.0000 | INHALATION_SPRAY | Freq: Two times a day (BID) | RESPIRATORY_TRACT | Status: DC
  Administered 2015-07-07 – 2015-07-11 (×7): 2 via RESPIRATORY_TRACT
  Filled 2015-07-05 (×3): qty 8.8

## 2015-07-05 MED ORDER — HYDRALAZINE HCL 20 MG/ML IJ SOLN
10.0000 mg | INTRAMUSCULAR | Status: DC | PRN
  Administered 2015-07-05: 10 mg via INTRAVENOUS
  Filled 2015-07-05: qty 1

## 2015-07-05 MED ORDER — METOPROLOL SUCCINATE ER 25 MG PO TB24: 25.0000 mg | ORAL_TABLET | Freq: Every day | ORAL | Status: DC

## 2015-07-05 MED ORDER — SODIUM CHLORIDE 0.9 % IV SOLN
INTRAVENOUS | Status: DC
  Administered 2015-07-05: 21:00:00 via INTRAVENOUS

## 2015-07-05 MED ORDER — SODIUM CHLORIDE 0.9 % IV SOLN: 80.0000 mg | Freq: Once | INTRAVENOUS | Status: DC

## 2015-07-05 MED ORDER — LEVOTHYROXINE SODIUM 200 MCG PO TABS: ORAL_TABLET | ORAL | Status: DC

## 2015-07-05 MED ORDER — HEPARIN SODIUM (PORCINE) 5000 UNIT/ML IJ SOLN
5000.0000 [IU] | Freq: Three times a day (TID) | INTRAMUSCULAR | Status: DC
  Administered 2015-07-06 (×2): 5000 [IU] via SUBCUTANEOUS
  Filled 2015-07-05 (×2): qty 1

## 2015-07-05 MED ORDER — CLONIDINE HCL 0.1 MG PO TABS: 0.1000 mg | ORAL_TABLET | Freq: Two times a day (BID) | ORAL | Status: DC

## 2015-07-05 MED ORDER — PANTOPRAZOLE SODIUM 40 MG PO TBEC: 40.0000 mg | DELAYED_RELEASE_TABLET | Freq: Two times a day (BID) | ORAL | Status: DC

## 2015-07-05 MED ORDER — AMLODIPINE BESYLATE 5 MG PO TABS: 10.0000 mg | ORAL_TABLET | Freq: Every day | ORAL | Status: DC

## 2015-07-05 MED ORDER — ATORVASTATIN CALCIUM 10 MG PO TABS: 10.0000 mg | ORAL_TABLET | Freq: Every day | ORAL | Status: DC

## 2015-07-05 MED ORDER — POLYVINYL ALCOHOL 1.4 % OP SOLN
1.0000 [drp] | Freq: Four times a day (QID) | OPHTHALMIC | Status: DC
  Administered 2015-07-06 – 2015-07-11 (×21): 1 [drp] via OPHTHALMIC
  Filled 2015-07-05: qty 15

## 2015-07-05 MED ORDER — GABAPENTIN 300 MG PO CAPS: 300.0000 mg | ORAL_CAPSULE | Freq: Three times a day (TID) | ORAL | Status: DC

## 2015-07-05 MED ORDER — IOHEXOL 300 MG/ML  SOLN: 25.0000 mL | Freq: Once | INTRAMUSCULAR | Status: DC | PRN

## 2015-07-05 MED ORDER — SERTRALINE HCL 50 MG PO TABS: 100.0000 mg | ORAL_TABLET | Freq: Every day | ORAL | Status: DC

## 2015-07-05 MED ORDER — LEVOTHYROXINE SODIUM 200 MCG PO TABS: 200.0000 ug | ORAL_TABLET | Freq: Every day | ORAL | Status: DC

## 2015-07-05 MED ORDER — SODIUM CHLORIDE 1 G PO TABS: 1.0000 g | ORAL_TABLET | Freq: Three times a day (TID) | ORAL | Status: DC

## 2015-07-05 MED ORDER — ISOSORBIDE MONONITRATE ER 30 MG PO TB24: 30.0000 mg | ORAL_TABLET | Freq: Every day | ORAL | Status: DC

## 2015-07-05 MED ORDER — ACETAMINOPHEN 325 MG PO TABS
325.0000 mg | ORAL_TABLET | Freq: Four times a day (QID) | ORAL | Status: DC | PRN
Start: 2015-07-05 — End: 2015-07-05

## 2015-07-05 MED ORDER — HYDROMORPHONE HCL 1 MG/ML IJ SOLN
0.5000 mg | INTRAMUSCULAR | Status: DC | PRN
  Administered 2015-07-05: 0.5 mg via INTRAVENOUS
  Filled 2015-07-05: qty 1

## 2015-07-05 MED ORDER — CYCLOSPORINE 0.05 % OP EMUL
1.0000 [drp] | Freq: Two times a day (BID) | OPHTHALMIC | Status: DC
  Administered 2015-07-06 – 2015-07-11 (×10): 1 [drp] via OPHTHALMIC
  Filled 2015-07-05 (×11): qty 1

## 2015-07-05 MED ORDER — ALBUTEROL SULFATE HFA 108 (90 BASE) MCG/ACT IN AERS: 2.0000 | INHALATION_SPRAY | Freq: Four times a day (QID) | RESPIRATORY_TRACT | Status: DC | PRN

## 2015-07-05 MED ORDER — LATANOPROST 0.005 % OP SOLN
1.0000 [drp] | Freq: Every day | OPHTHALMIC | Status: DC
  Administered 2015-07-06 – 2015-07-10 (×5): 1 [drp] via OPHTHALMIC
  Filled 2015-07-05: qty 2.5

## 2015-07-05 MED ORDER — HYDRALAZINE HCL 100 MG PO TABS: 100.0000 mg | ORAL_TABLET | Freq: Three times a day (TID) | ORAL | Status: DC

## 2015-07-05 NOTE — ED Notes (Signed)
Pt and family member reports pt has hx of dysphagia, is already eating pureed diet. Pt reports increase in difficulty swallowing over past two days, feels like it is choking her when she eats and abd is swelling. Airway intact at triage.

## 2015-07-05 NOTE — ED Notes (Signed)
DG esophagus in AM

## 2015-07-05 NOTE — Telephone Encounter (Signed)
Patient daughter, [name] requested to be faxed to pharmacy.

## 2015-07-05 NOTE — ED Provider Notes (Signed)
CSN: [id number]     Arrival date & time 07/05/15  1219 History   First MD Initiated Contact with Patient 07/05/15 1621     Chief Complaint  Patient presents with  . Dysphagia     (Consider location/radiation/quality/duration/timing/severity/associated sxs/prior Treatment) HPI   Blood pressure 199/62, pulse 58, temperature 97.6 F (36.4 C), temperature source Oral, resp. rate 16, SpO2 100 %.  Shannon [name] is a 82 y.o. female complaining of choking sensation worsening over the course of 2 days. Patient is cared for at home by her daughter, she has a pure diet. She feels like she can get down little sips of water but otherwise feels like she has to go to the bathroom involvement however she has not vomited this far, there is associated chest pain with the ingestion of food, she has no active chest pain at this time. Patient with history of small bowel obstruction requiring surgery. She states she is passing flatus normally and has had to normally formed bowel movements today. She denies abdominal pain but states that she feels like her stomach is very pleased does not feel like her prior SBO. Denies cough, shortness of breath, fever, chills.   GI: Henrene Pastor  Past Medical History  Diagnosis Date  . Pneumonia   . Hyperlipidemia   . Hypertension   . Small bowel obstruction ([place])     "I don't know how many" (01/11/2015)  . Ventral hernia with bowel obstruction   . Asthma   . Hyperlipidemia   . Stroke ([place])     "light one"  . COPD (chronic obstructive pulmonary disease) ([place])   . Hypothyroidism   . Type II diabetes mellitus ([place])   . Anemia   . History of blood transfusion   . GERD (gastroesophageal reflux disease)   . History of stomach ulcers   . Arthritis     "all over"  . Renal insufficiency   . Depression     "light case"  . SIADH (syndrome of inappropriate ADH production) ([place])     [name] 01/10/2015   Past Surgical History  Procedure Laterality Date  . Tubal ligation    .  Colectomy    . Hernia repair  2015  . Esophagogastroduodenoscopy N/A 01/19/2014    Procedure: ESOPHAGOGASTRODUODENOSCOPY (EGD);  Surgeon: Irene Shipper, MD;  Location: [facility];  Service: Endoscopy;  Laterality: N/A;  . Esophagogastroduodenoscopy N/A 01/20/2014    Procedure: ESOPHAGOGASTRODUODENOSCOPY (EGD);  Surgeon: Irene Shipper, MD;  Location: [facility];  Service: Endoscopy;  Laterality: N/A;  . Esophagogastroduodenoscopy N/A 03/19/2014    Procedure: ESOPHAGOGASTRODUODENOSCOPY (EGD);  Surgeon: Milus Banister, MD;  Location: [facility];  Service: Endoscopy;  Laterality: N/A;  . Cholecystectomy open    . Ventral hernia repair  2015    incarcerated ventral hernia ([facility] 09/2013)/notes 01/10/2015  . Cataract extraction w/ intraocular lens  implant, bilateral    . Gastrojejunostomy      hx/notes 01/10/2015  . Laparotomy N/A 01/20/2015    Procedure: EXPLORATORY LAPAROTOMY;  Surgeon: Coralie Keens, MD;  Location: [place];  Service: General;  Laterality: N/A;  . Lysis of adhesion N/A 01/20/2015    Procedure: LYSIS OF ADHESIONS < 1 HOUR;  Surgeon: Coralie Keens, MD;  Location: [place];  Service: General;  Laterality: N/A;   Family History  Problem Relation Age of Onset  . Stroke Mother   . Heart attack Neg Hx   . Hypertension Mother   . Diabetes Brother    Social History  Substance  Use Topics  . Smoking status: Never Smoker   . Smokeless tobacco: Former Systems developer    Types: Snuff  . Alcohol Use: No   OB History    No data available     Review of Systems  10 systems reviewed and found to be negative, except as noted in the HPI.  Allergies  Review of patient's allergies indicates no known allergies.  Home Medications   Prior to Admission medications   Medication Sig Start Date End Date Taking? Authorizing Provider  acetaminophen (TYLENOL) 325 MG tablet Take 325 mg by mouth every 6 (six) hours as needed for mild pain or fever.    Yes Historical Provider, MD  ADVAIR DISKUS  250-50 MCG/DOSE AEPB INHALE 1 PUFF INTO THE LUNGS 2 (TWO) TIMES DAILY AS NEEDED FOR SHORTNESS OF BREATH 12/01/14  Yes Historical Provider, MD  albuterol (PROVENTIL HFA;VENTOLIN HFA) 108 (90 Base) MCG/ACT inhaler Inhale 2 puffs into the lungs every 6 (six) hours as needed for wheezing or shortness of breath. 06/04/15  Yes Monica Carter, DO  amLODipine (NORVASC) 10 MG tablet TAKE 1 TABLET BY MOUTH DAILY FOR HYPERTENSION 03/15/15  Yes Gildardo Cranker, DO  aspirin EC 81 MG tablet Take 81 mg by mouth every morning.   Yes Historical Provider, MD  atorvastatin (LIPITOR) 10 MG tablet TAKE 1 TABLET BY MOUTH EVERY DAY Patient taking differently: TAKE 1 TABLET BY MOUTH EVERY DAY IN MORNING 04/08/15  Yes Gildardo Cranker, DO  b complex vitamins tablet Take 1 tablet by mouth daily.    Yes Historical Provider, MD  calcium-vitamin D (OSCAL WITH D) 500-200 MG-UNIT per tablet Take 1 tablet by mouth daily with breakfast.    Yes Historical Provider, MD  chlorhexidine (PERIDEX) 0.12 % solution 15 mLs by Mouth Rinse route 2 (two) times daily. Patient taking differently: 15 mLs by Mouth Rinse route every evening.  03/26/14  Yes Robbie Lis, MD  cloNIDine (CATAPRES) 0.1 MG tablet Take 1 tablet (0.1 mg total) by mouth 2 (two) times daily. 07/13/14  Yes Dorothy Spark, MD  [employer] GAS RELIEF 80 MG chewable tablet CHEW 1 TABLET BY MOUTH 4 TIMES A DAY AS NEEDED FOR FLATULENCE 06/28/15  Yes Gildardo Cranker, DO  diclofenac sodium (VOLTAREN) 1 % GEL Apply 2 g topically daily as needed (for pain).    Yes Historical Provider, MD  feeding supplement (BOOST / RESOURCE BREEZE) LIQD Take 1 Container by mouth 3 (three) times daily between meals. 01/27/15  Yes Shanker Kristeen Mans, MD  fluticasone (FLONASE) 50 MCG/ACT nasal spray Place 2 sprays into both nostrils daily. 08/07/14  Yes Gildardo Cranker, DO  gabapentin (NEURONTIN) 300 MG capsule TAKE 1 CAPSULE (300 MG TOTAL) BY MOUTH 3 (THREE) TIMES DAILY. 05/31/15  Yes Gildardo Cranker, DO  guaiFENesin (MUCINEX)  600 MG 12 hr tablet Take 1 tablet (600 mg total) by mouth 2 (two) times daily. Patient taking differently: Take 600 mg by mouth 2 (two) times daily as needed for cough or to loosen phlegm.  06/20/15  Yes Dorie Rank, MD  hydrALAZINE (APRESOLINE) 100 MG tablet TAKE 1 TABLET (100 MG TOTAL) BY MOUTH 3 (THREE) TIMES DAILY. 05/31/15  Yes Gildardo Cranker, DO  isosorbide mononitrate (IMDUR) 30 MG 24 hr tablet Take one tablet by mouth once daily. If Chest Pains occur increase to two tablets daily. Patient taking differently: Take 30 mg by mouth daily.  07/22/14  Yes Monica Carter, DO  lactulose (CHRONULAC) 10 GM/15ML solution Take 15 mLs (10 g total) by mouth  daily as needed for mild constipation. 06/02/14  Yes Mahima Pandey, MD  latanoprost (XALATAN) 0.005 % ophthalmic solution Place 1 drop into both eyes at bedtime. 06/02/14  Yes Mahima Bubba Camp, MD  levothyroxine (SYNTHROID, LEVOTHROID) 200 MCG tablet Take one tablet by mouth once daily 30 minutes before breakfast for thyroid Patient taking differently: Take 200 mcg by mouth daily before breakfast. Take one tablet by mouth once daily 30 minutes before breakfast for thyroid 07/05/15  Yes Gildardo Cranker, DO  metoprolol succinate (TOPROL-XL) 25 MG 24 hr tablet TAKE 1 TABLET (25 MG TOTAL) BY MOUTH DAILY. 06/17/15  Yes [name], NP  Multiple Vitamins-Minerals (MULTIVITAMIN WITH MINERALS) tablet Take 1 tablet by mouth every morning.    Yes Historical Provider, MD  pantoprazole (PROTONIX) 40 MG tablet TAKE 1 TABLET BY MOUTH TWICE A DAY 08/07/14  Yes Gildardo Cranker, DO  Polyethyl Glycol-Propyl Glycol 0.4-0.3 % SOLN Place 1 drop into both eyes 4 (four) times daily.    Yes Historical Provider, MD  polyethylene glycol powder (GLYCOLAX/MIRALAX) powder Take 17 g by mouth 2 (two) times daily as needed. Patient taking differently: Take 17 g by mouth 2 (two) times daily.  03/12/15  Yes Monica Carter, DO  RESTASIS 0.05 % ophthalmic emulsion USE 1 DROP INTO BOTH EYES TWICE DAILY  10/19/14  Yes Gildardo Cranker, DO  sennosides-docusate sodium (SENOKOT-S) 8.6-50 MG tablet Take 1 tablet by mouth 2 (two) times daily.    Yes Historical Provider, MD  sertraline (ZOLOFT) 100 MG tablet TAKE 1 TABLET BY MOUTH ONCE DAILY FOR DEPRESSION 07/05/15  Yes Gildardo Cranker, DO  sodium chloride 1 g tablet TAKE 1 TABLET BY MOUTH THREE TIMES DAILY 05/19/15  Yes Gildardo Cranker, DO  traMADol (ULTRAM) 50 MG tablet Take 50 mg by mouth 2 (two) times daily as needed for moderate pain.    Yes Historical Provider, MD   BP 192/66 mmHg  Pulse 55  Temp(Src) 97.6 F (36.4 C) (Oral)  Resp 19  SpO2 100% Physical Exam  Constitutional: She is oriented to person, place, and time. She appears well-developed and well-nourished. No distress.  HENT:  Head: Normocephalic and atraumatic.  Mouth/Throat: Oropharynx is clear and moist.  Tolerating her secretions without issue  Eyes: Conjunctivae and EOM are normal. Pupils are equal, round, and reactive to light.  Neck: Normal range of motion.  Cardiovascular: Normal rate, regular rhythm and intact distal pulses.   Pulmonary/Chest: Effort normal and breath sounds normal.  Abdominal: Soft. There is no tenderness.  Musculoskeletal: Normal range of motion.  Neurological: She is alert and oriented to person, place, and time.  Skin: She is not diaphoretic.  Psychiatric: She has a normal mood and affect.  Nursing note and vitals reviewed.   ED Course  Procedures (including critical care time) Labs Review Labs Reviewed  CBC WITH DIFFERENTIAL/PLATELET - Abnormal; Notable for the following:    HCT 35.6 (*)    RDW 16.0 (*)    All other components within normal limits  BASIC METABOLIC PANEL - Abnormal; Notable for the following:    CO2 20 (*)    Glucose, Bld 101 (*)    BUN 37 (*)    Creatinine, Ser 1.62 (*)    GFR calc non Af Amer 27 (*)    GFR calc Af Amer 31 (*)    All other components within normal limits  HEPATIC FUNCTION PANEL  URINALYSIS, ROUTINE W REFLEX  MICROSCOPIC (NOT AT [facility])  TROPONIN I  BASIC METABOLIC PANEL  Imaging Review Ct Abdomen Pelvis Wo Contrast  07/05/2015  CLINICAL DATA:  Bloating, difficulty swallowing, history small-bowel obstruction, ventral hernia, GERD, stomach ulcers, hypertension, type II diabetes mellitus, COPD, asthma EXAM: CT ABDOMEN AND PELVIS WITHOUT CONTRAST TECHNIQUE: Multidetector CT imaging of the abdomen and pelvis was performed following the standard protocol without IV contrast. Sagittal and coronal MPR images reconstructed from axial data set. Patient drank dilute oral contrast for exam. COMPARISON:  01/10/2015 FINDINGS: Lung bases clear. Multiple low-attenuation hepatic foci likely cysts, unchanged. Gallbladder surgically absent. Remainder of liver, spleen, pancreas, kidneys, and adrenal glands normal. Extensive atherosclerotic calcifications. Questionable mild wall thickening of distal esophagus. Increased thickening of the gastric wall versus the previous exam, nonspecific. Small ventral hernia containing nonobstructed bowel loop. Scattered colonic diverticulosis without evidence of diverticulitis or bowel obstruction. Bladder, ureters, uterus and adnexa normal appearance. Appendix not visualized. No mass, adenopathy, free air or free fluid. Bones demineralized. IMPRESSION: No definite acute intra abdominal abnormalities. Questionable mild wall thickening of the gastric wall versus artifact from underdistention ; consider followup endoscopy or upper GI assessment if patient has persistent symptoms. Electronically Signed   By: Lavonia Dana M.D.   On: 07/05/2015 19:53   I have personally reviewed and evaluated these images and lab results as part of my medical decision-making.   EKG Interpretation   Date/Time:  Monday July 05 2015 17:04:56 EDT Ventricular Rate:  48 PR Interval:  149 QRS Duration: 86 QT Interval:  449 QTC Calculation: 401 R Axis:   70 Text Interpretation:  Sinus bradycardia Confirmed by  Ashok Cordia  MD, Lennette Bihari  ([id number]) on 07/05/2015 6:28:09 PM      MDM   Final diagnoses:  Dysphagia    Filed Vitals:   07/05/15 1248 07/05/15 1608 07/05/15 2000  BP: 125/58 199/62 192/66  Pulse: 58  55  Temp: 98 F (36.7 C) 97.6 F (36.4 C)   TempSrc: Oral Oral   Resp: 18 16 19   SpO2: 100% 100% 100%    Medications  iohexol (OMNIPAQUE) 300 MG/ML solution 25 mL (not administered)  0.9 %  sodium chloride infusion ( Intravenous New Bag/Given 07/05/15 2106)  albuterol (PROVENTIL HFA;VENTOLIN HFA) 108 (90 Base) MCG/ACT inhaler 2 puff (not administered)  mometasone-formoterol (DULERA) 200-5 MCG/ACT inhaler 2 puff (not administered)  fluticasone (FLONASE) 50 MCG/ACT nasal spray 2 spray (not administered)  Polyethyl Glycol-Propyl Glycol 0.4-0.3 % SOLN 1 drop (not administered)  latanoprost (XALATAN) 0.005 % ophthalmic solution 1 drop (not administered)  cycloSPORINE (RESTASIS) 0.05 % ophthalmic emulsion 1 drop (not administered)  hydrALAZINE (APRESOLINE) injection 10 mg (10 mg Intravenous Given 07/05/15 2106)  HYDROmorphone (DILAUDID) injection 0.5-1 mg (0.5 mg Intravenous Given 07/05/15 2106)  heparin injection 5,000 Units (not administered)  pantoprazole (PROTONIX) 80 mg in sodium chloride 0.9 % 100 mL IVPB (not administered)  sodium chloride 0.9 % bolus 500 mL (0 mLs Intravenous Stopped 07/05/15 1742)    Shannon [name] is 82 y.o. female presenting with dysphasia and sensation of nausea with no vomiting associated chest pain when she eats. Patient starting on pured diet, she appears to only have comfort tolerating water. Abdominal exam is benign, vital signs stable. Patient with complicated GI history with remote JG anastomosis, bezoar, tortuous pylorus. Asian with no active chest pain, tolerating her secretions.  GFR is 31, cutoff or IV contrast is 30, will do oral only contrast to preserve renal function.  Blood work with no significant abnormality, mildly elevated BUN at 37. UA without signs of  infection. Cardiac workup including  EKG and troponin are negative. CT abdomen pelvis with no significant abnormality, is no longer tolerating any fluid greater than the consistency of water, will need admission for dysphagia workup.  Dr. Alcario Drought except admission. Case discussed with [facility] gastroenterologist Dr. Havery Moros who has reviewed this patient's complicated gastroenterology history, states that she is to remain nothing by mouth, elevate the head of the bed, try to get a barium swallow first thing in the a.m. for planned EGD in the OR.  This is a shared visit with the attending physician who personally evaluated the patient and agrees with the care plan.       Monico Blitz, PA-C 07/05/15 2140  Lajean Saver, MD 07/08/15 1520

## 2015-07-05 NOTE — H&P (Signed)
[facility] History and Physical  Shannon [name] ZOX:[id number] DOB: 11-23-1923 DOA: 07/05/2015  Referring physician: EDP PCP: Kirt Boys, DO   Chief Complaint: Dysphagia   HPI: Shannon [name] is a 82 y.o. female who presents to the ED with c/o feeling of food getting stuck when swallowing.  Patient has been on pureed diet ever since her last operation in October.  Patient has history of perforated gastric ulcer in 2015, lysis of adhesions due to SBO in October 2016.  EDP spoke with Dr. Adela Lank with GI, EGD planned for tomorrow.  Review of Systems: Systems reviewed.  As above, otherwise negative  Past Medical History  Diagnosis Date  . Pneumonia   . Hyperlipidemia   . Hypertension   . Small bowel obstruction (HCC)     "I don't know how many" (01/11/2015)  . Ventral hernia with bowel obstruction   . Asthma   . Hyperlipidemia   . Stroke (HCC)     "light one"  . COPD (chronic obstructive pulmonary disease) (HCC)   . Hypothyroidism   . Type II diabetes mellitus (HCC)   . Anemia   . History of blood transfusion   . GERD (gastroesophageal reflux disease)   . History of stomach ulcers   . Arthritis     "all over"  . Renal insufficiency   . Depression     "light case"  . SIADH (syndrome of inappropriate ADH production) (HCC)     Shannon [name] 01/10/2015   Past Surgical History  Procedure Laterality Date  . Tubal ligation    . Colectomy    . Hernia repair  2015  . Esophagogastroduodenoscopy N/A 01/19/2014    Procedure: ESOPHAGOGASTRODUODENOSCOPY (EGD);  Surgeon: Hilarie Fredrickson, MD;  Location: [facility];  Service: Endoscopy;  Laterality: N/A;  . Esophagogastroduodenoscopy N/A 01/20/2014    Procedure: ESOPHAGOGASTRODUODENOSCOPY (EGD);  Surgeon: Hilarie Fredrickson, MD;  Location: [facility];  Service: Endoscopy;  Laterality: N/A;  . Esophagogastroduodenoscopy N/A 03/19/2014    Procedure: ESOPHAGOGASTRODUODENOSCOPY (EGD);  Surgeon: Rachael Fee, MD;  Location: [facility];   Service: Endoscopy;  Laterality: N/A;  . Cholecystectomy open    . Ventral hernia repair  2015    incarcerated ventral hernia ([facility] 09/2013)/notes 01/10/2015  . Cataract extraction w/ intraocular lens  implant, bilateral    . Gastrojejunostomy      hx/notes 01/10/2015  . Laparotomy N/A 01/20/2015    Procedure: EXPLORATORY LAPAROTOMY;  Surgeon: Abigail Miyamoto, MD;  Location: [facility] OR;  Service: General;  Laterality: N/A;  . Lysis of adhesion N/A 01/20/2015    Procedure: LYSIS OF ADHESIONS < 1 HOUR;  Surgeon: Abigail Miyamoto, MD;  Location: [facility] OR;  Service: General;  Laterality: N/A;   Social History:  reports that she has never smoked. She has quit using smokeless tobacco. Her smokeless tobacco use included Snuff. She reports that she does not drink alcohol or use illicit drugs.  No Known Allergies  Family History  Problem Relation Age of Onset  . Stroke Mother   . Heart attack Neg Hx   . Hypertension Mother   . Diabetes Brother      Prior to Admission medications   Medication Sig Start Date End Date Taking? Authorizing Provider  acetaminophen (TYLENOL) 325 MG tablet Take 325 mg by mouth every 6 (six) hours as needed for mild pain or fever.    Yes Historical Provider, MD  ADVAIR DISKUS 250-50 MCG/DOSE AEPB INHALE 1 PUFF INTO THE LUNGS 2 (TWO) TIMES DAILY AS NEEDED FOR SHORTNESS OF  BREATH 12/01/14  Yes Historical Provider, MD  albuterol (PROVENTIL HFA;VENTOLIN HFA) 108 (90 Base) MCG/ACT inhaler Inhale 2 puffs into the lungs every 6 (six) hours as needed for wheezing or shortness of breath. 06/04/15  Yes Monica Carter, DO  amLODipine (NORVASC) 10 MG tablet TAKE 1 TABLET BY MOUTH DAILY FOR HYPERTENSION 03/15/15  Yes Kirt Boys, DO  aspirin EC 81 MG tablet Take 81 mg by mouth every morning.   Yes Historical Provider, MD  atorvastatin (LIPITOR) 10 MG tablet TAKE 1 TABLET BY MOUTH EVERY DAY Patient taking differently: TAKE 1 TABLET BY MOUTH EVERY DAY IN MORNING 04/08/15  Yes Kirt Boys, DO  b  complex vitamins tablet Take 1 tablet by mouth daily.    Yes Historical Provider, MD  calcium-vitamin D (OSCAL WITH D) 500-200 MG-UNIT per tablet Take 1 tablet by mouth daily with breakfast.    Yes Historical Provider, MD  chlorhexidine (PERIDEX) 0.12 % solution 15 mLs by Mouth Rinse route 2 (two) times daily. Patient taking differently: 15 mLs by Mouth Rinse route every evening.  03/26/14  Yes Alison Murray, MD  cloNIDine (CATAPRES) 0.1 MG tablet Take 1 tablet (0.1 mg total) by mouth 2 (two) times daily. 07/13/14  Yes Lars Masson, MD  [employer] GAS RELIEF 80 MG chewable tablet CHEW 1 TABLET BY MOUTH 4 TIMES A DAY AS NEEDED FOR FLATULENCE 06/28/15  Yes Kirt Boys, DO  diclofenac sodium (VOLTAREN) 1 % GEL Apply 2 g topically daily as needed (for pain).    Yes Historical Provider, MD  feeding supplement (BOOST / RESOURCE BREEZE) LIQD Take 1 Container by mouth 3 (three) times daily between meals. 01/27/15  Yes Shanker Levora Dredge, MD  fluticasone (FLONASE) 50 MCG/ACT nasal spray Place 2 sprays into both nostrils daily. 08/07/14  Yes Kirt Boys, DO  gabapentin (NEURONTIN) 300 MG capsule TAKE 1 CAPSULE (300 MG TOTAL) BY MOUTH 3 (THREE) TIMES DAILY. 05/31/15  Yes Kirt Boys, DO  guaiFENesin (MUCINEX) 600 MG 12 hr tablet Take 1 tablet (600 mg total) by mouth 2 (two) times daily. Patient taking differently: Take 600 mg by mouth 2 (two) times daily as needed for cough or to loosen phlegm.  06/20/15  Yes Linwood Dibbles, MD  hydrALAZINE (APRESOLINE) 100 MG tablet TAKE 1 TABLET (100 MG TOTAL) BY MOUTH 3 (THREE) TIMES DAILY. 05/31/15  Yes Kirt Boys, DO  isosorbide mononitrate (IMDUR) 30 MG 24 hr tablet Take one tablet by mouth once daily. If Chest Pains occur increase to two tablets daily. Patient taking differently: Take 30 mg by mouth daily.  07/22/14  Yes Monica Carter, DO  lactulose (CHRONULAC) 10 GM/15ML solution Take 15 mLs (10 g total) by mouth daily as needed for mild constipation. 06/02/14  Yes Mahima Pandey,  MD  latanoprost (XALATAN) 0.005 % ophthalmic solution Place 1 drop into both eyes at bedtime. 06/02/14  Yes Mahima Glade Lloyd, MD  levothyroxine (SYNTHROID, LEVOTHROID) 200 MCG tablet Take one tablet by mouth once daily 30 minutes before breakfast for thyroid Patient taking differently: Take 200 mcg by mouth daily before breakfast. Take one tablet by mouth once daily 30 minutes before breakfast for thyroid 07/05/15  Yes Kirt Boys, DO  metoprolol succinate (TOPROL-XL) 25 MG 24 hr tablet TAKE 1 TABLET (25 MG TOTAL) BY MOUTH DAILY. 06/17/15  Yes Sharon Seller, NP  Multiple Vitamins-Minerals (MULTIVITAMIN WITH MINERALS) tablet Take 1 tablet by mouth every morning.    Yes Historical Provider, MD  pantoprazole (PROTONIX) 40 MG tablet TAKE 1 TABLET  BY MOUTH TWICE A DAY 08/07/14  Yes Kirt Boys, DO  Polyethyl Glycol-Propyl Glycol 0.4-0.3 % SOLN Place 1 drop into both eyes 4 (four) times daily.    Yes Historical Provider, MD  polyethylene glycol powder (GLYCOLAX/MIRALAX) powder Take 17 g by mouth 2 (two) times daily as needed. Patient taking differently: Take 17 g by mouth 2 (two) times daily.  03/12/15  Yes Monica Carter, DO  RESTASIS 0.05 % ophthalmic emulsion USE 1 DROP INTO BOTH EYES TWICE DAILY 10/19/14  Yes Kirt Boys, DO  sennosides-docusate sodium (SENOKOT-S) 8.6-50 MG tablet Take 1 tablet by mouth 2 (two) times daily.    Yes Historical Provider, MD  sertraline (ZOLOFT) 100 MG tablet TAKE 1 TABLET BY MOUTH ONCE DAILY FOR DEPRESSION 07/05/15  Yes Kirt Boys, DO  sodium chloride 1 g tablet TAKE 1 TABLET BY MOUTH THREE TIMES DAILY 05/19/15  Yes Kirt Boys, DO  traMADol (ULTRAM) 50 MG tablet Take 50 mg by mouth 2 (two) times daily as needed for moderate pain.    Yes Historical Provider, MD   Physical Exam: Filed Vitals:   07/05/15 1608 07/05/15 2000  BP: 199/62 192/66  Pulse:  55  Temp: 97.6 F (36.4 C)   Resp: 16 19    BP 192/66 mmHg  Pulse 55  Temp(Src) 97.6 F (36.4 C) (Oral)  Resp 19   SpO2 100%  General Appearance:    Alert, oriented, no distress, appears stated age  Head:    Normocephalic, atraumatic  Eyes:    PERRL, EOMI, sclera non-icteric        Nose:   Nares without drainage or epistaxis. Mucosa, turbinates normal  Throat:   Moist mucous membranes. Oropharynx without erythema or exudate.  Neck:   Supple. No carotid bruits.  No thyromegaly.  No lymphadenopathy.   Back:     No CVA tenderness, no spinal tenderness  Lungs:     Clear to auscultation bilaterally, without wheezes, rhonchi or rales  Chest wall:    No tenderness to palpitation  Heart:    Regular rate and rhythm without murmurs, gallops, rubs  Abdomen:     Soft, non-tender, nondistended, normal bowel sounds, no organomegaly  Genitalia:    deferred  Rectal:    deferred  Extremities:   No clubbing, cyanosis or edema.  Pulses:   2+ and symmetric all extremities  Skin:   Skin color, texture, turgor normal, no rashes or lesions  Lymph nodes:   Cervical, supraclavicular, and axillary nodes normal  Neurologic:   CNII-XII intact. Normal strength, sensation and reflexes      throughout    Labs on Admission:  Basic Metabolic Panel:  Recent Labs Lab 07/05/15 1709  NA 138  K 3.9  CL 105  CO2 20*  GLUCOSE 101*  BUN 37*  CREATININE 1.62*  CALCIUM 10.1   Liver Function Tests:  Recent Labs Lab 07/05/15 1709  AST 24  ALT 24  ALKPHOS 63  BILITOT 0.9  PROT 7.1  ALBUMIN 3.8   No results for input(s): LIPASE, AMYLASE in the last 168 hours. No results for input(s): AMMONIA in the last 168 hours. CBC:  Recent Labs Lab 07/05/15 1709  WBC 7.1  NEUTROABS 4.7  HGB 12.5  HCT 35.6*  MCV 89.4  PLT 268   Cardiac Enzymes:  Recent Labs Lab 07/05/15 1709  TROPONINI <0.03    BNP (last 3 results) No results for input(s): PROBNP in the last 8760 hours. CBG: No results for input(s): GLUCAP in the  last 168 hours.  Radiological Exams on Admission: Ct Abdomen Pelvis Wo Contrast  07/05/2015   CLINICAL DATA:  Bloating, difficulty swallowing, history small-bowel obstruction, ventral hernia, GERD, stomach ulcers, hypertension, type II diabetes mellitus, COPD, asthma EXAM: CT ABDOMEN AND PELVIS WITHOUT CONTRAST TECHNIQUE: Multidetector CT imaging of the abdomen and pelvis was performed following the standard protocol without IV contrast. Sagittal and coronal MPR images reconstructed from axial data set. Patient drank dilute oral contrast for exam. COMPARISON:  01/10/2015 FINDINGS: Lung bases clear. Multiple low-attenuation hepatic foci likely cysts, unchanged. Gallbladder surgically absent. Remainder of liver, spleen, pancreas, kidneys, and adrenal glands normal. Extensive atherosclerotic calcifications. Questionable mild wall thickening of distal esophagus. Increased thickening of the gastric wall versus the previous exam, nonspecific. Small ventral hernia containing nonobstructed bowel loop. Scattered colonic diverticulosis without evidence of diverticulitis or bowel obstruction. Bladder, ureters, uterus and adnexa normal appearance. Appendix not visualized. No mass, adenopathy, free air or free fluid. Bones demineralized. IMPRESSION: No definite acute intra abdominal abnormalities. Questionable mild wall thickening of the gastric wall versus artifact from underdistention ; consider followup endoscopy or upper GI assessment if patient has persistent symptoms. Electronically Signed   By: Ulyses Southward M.D.   On: 07/05/2015 19:53    EKG: Independently reviewed.  Assessment/Plan Active Problems:   Dysphagia   1. Dysphagia - 1. EDP spoke with Dr. Adela Lank with GI 2. NPO 3. Plan for EGD in AM 4. If we can get it, attempt barium swallow in AM for a road map (ordered) 5. Pain meds PRN 6. IVF 2. HTN - holding PO meds 1. Hydralazine IV PRN 2. Tele monitor for hypertension    Code Status: Full per daughter Family Communication: Daughter at bedside Disposition Plan: Admit to inpatient    Time spent: 70 min  GARDNER, JARED M. [facility] Pager [phone]  If 7AM-7PM, please contact the day team taking care of the patient [email or web address] Password [email or web address] 07/05/2015, 9:15 PM

## 2015-07-06 ENCOUNTER — Inpatient Hospital Stay (HOSPITAL_COMMUNITY): Payer: Medicare Other

## 2015-07-06 ENCOUNTER — Ambulatory Visit: Payer: Medicare Other | Admitting: Internal Medicine

## 2015-07-06 ENCOUNTER — Encounter (HOSPITAL_COMMUNITY): Payer: Self-pay | Admitting: Physician Assistant

## 2015-07-06 DIAGNOSIS — R0789 Other chest pain: Secondary | ICD-10-CM

## 2015-07-06 DIAGNOSIS — R1310 Dysphagia, unspecified: Secondary | ICD-10-CM

## 2015-07-06 DIAGNOSIS — R0600 Dyspnea, unspecified: Secondary | ICD-10-CM

## 2015-07-06 DIAGNOSIS — I4892 Unspecified atrial flutter: Secondary | ICD-10-CM | POA: Diagnosis not present

## 2015-07-06 DIAGNOSIS — R0603 Acute respiratory distress: Secondary | ICD-10-CM | POA: Diagnosis present

## 2015-07-06 DIAGNOSIS — E0590 Thyrotoxicosis, unspecified without thyrotoxic crisis or storm: Secondary | ICD-10-CM

## 2015-07-06 LAB — TROPONIN I
Troponin I: 0.04 ng/mL — ABNORMAL HIGH (ref <0.031)
Troponin I: 0.28 ng/mL — ABNORMAL HIGH (ref <0.031)
Troponin I: 0.77 ng/mL (ref <0.031)

## 2015-07-06 LAB — BASIC METABOLIC PANEL
Anion gap: 11 (ref 5–15)
BUN: 31 mg/dL — ABNORMAL HIGH (ref 6–20)
CO2: 21 mmol/L — ABNORMAL LOW (ref 22–32)
Calcium: 9.1 mg/dL (ref 8.9–10.3)
Chloride: 107 mmol/L (ref 101–111)
Creatinine, Ser: 1.49 mg/dL — ABNORMAL HIGH (ref 0.44–1.00)
GFR calc Af Amer: 34 mL/min — ABNORMAL LOW (ref >60)
GFR calc non Af Amer: 30 mL/min — ABNORMAL LOW (ref >60)
Glucose, Bld: 100 mg/dL — ABNORMAL HIGH (ref 65–99)
Potassium: 4 mmol/L (ref 3.5–5.1)
Sodium: 139 mmol/L (ref 135–145)

## 2015-07-06 LAB — BLOOD GAS, ARTERIAL
Acid-base deficit: 6.9 mmol/L — ABNORMAL HIGH (ref 0.0–2.0)
Bicarbonate: 14 mEq/L — ABNORMAL LOW (ref 20.0–24.0)
Drawn by: 331001
O2 Content: 2 L/min
O2 Saturation: 98.6 %
Patient temperature: 98.6
TCO2: 14.4 mmol/L (ref 0–100)
pCO2 arterial: 11.9 mmHg — CL (ref 35.0–45.0)
pH, Arterial: 7.672 (ref 7.350–7.450)
pO2, Arterial: 113 mmHg — ABNORMAL HIGH (ref 80.0–100.0)

## 2015-07-06 LAB — TSH: TSH: 0.029 u[IU]/mL — ABNORMAL LOW (ref 0.350–4.500)

## 2015-07-06 LAB — MRSA PCR SCREENING: MRSA by PCR: NEGATIVE

## 2015-07-06 LAB — GLUCOSE, CAPILLARY
Glucose-Capillary: 101 mg/dL — ABNORMAL HIGH (ref 65–99)
Glucose-Capillary: 162 mg/dL — ABNORMAL HIGH (ref 65–99)
Glucose-Capillary: 188 mg/dL — ABNORMAL HIGH (ref 65–99)

## 2015-07-06 MED ORDER — METOPROLOL TARTRATE 1 MG/ML IV SOLN
2.5000 mg | Freq: Once | INTRAVENOUS | Status: AC
  Administered 2015-07-06: 2.5 mg via INTRAVENOUS

## 2015-07-06 MED ORDER — DILTIAZEM HCL 100 MG IV SOLR
5.0000 mg/h | INTRAVENOUS | Status: DC
  Administered 2015-07-06: 10 mg/h via INTRAVENOUS
  Administered 2015-07-06: 5 mg/h via INTRAVENOUS
  Filled 2015-07-06: qty 100

## 2015-07-06 MED ORDER — NITROGLYCERIN 0.4 MG SL SUBL
SUBLINGUAL_TABLET | SUBLINGUAL | Status: AC
  Filled 2015-07-06: qty 3

## 2015-07-06 MED ORDER — FUROSEMIDE 10 MG/ML IJ SOLN
40.0000 mg | Freq: Once | INTRAMUSCULAR | Status: AC
  Administered 2015-07-06: 40 mg via INTRAVENOUS
  Filled 2015-07-06: qty 4

## 2015-07-06 MED ORDER — METHYLPREDNISOLONE SODIUM SUCC 40 MG IJ SOLR
40.0000 mg | Freq: Four times a day (QID) | INTRAMUSCULAR | Status: DC
  Administered 2015-07-06 – 2015-07-07 (×5): 40 mg via INTRAVENOUS
  Filled 2015-07-06 (×4): qty 1

## 2015-07-06 MED ORDER — DILTIAZEM LOAD VIA INFUSION
10.0000 mg | Freq: Once | INTRAVENOUS | Status: AC
  Administered 2015-07-06: 10 mg via INTRAVENOUS
  Filled 2015-07-06: qty 10

## 2015-07-06 MED ORDER — AMIODARONE HCL IN DEXTROSE 360-4.14 MG/200ML-% IV SOLN
60.0000 mg/h | INTRAVENOUS | Status: AC
  Administered 2015-07-06: 60 mg/h via INTRAVENOUS
  Filled 2015-07-06: qty 200

## 2015-07-06 MED ORDER — AMIODARONE HCL IN DEXTROSE 360-4.14 MG/200ML-% IV SOLN
30.0000 mg/h | INTRAVENOUS | Status: DC
  Administered 2015-07-06 – 2015-07-08 (×3): 30 mg/h via INTRAVENOUS
  Filled 2015-07-06 (×4): qty 200

## 2015-07-06 MED ORDER — METOPROLOL TARTRATE 1 MG/ML IV SOLN: 5.0000 mg | Freq: Two times a day (BID) | INTRAVENOUS | Status: DC

## 2015-07-06 MED ORDER — ALBUTEROL SULFATE (2.5 MG/3ML) 0.083% IN NEBU
2.5000 mg | INHALATION_SOLUTION | Freq: Four times a day (QID) | RESPIRATORY_TRACT | Status: DC | PRN
  Filled 2015-07-06 (×2): qty 3

## 2015-07-06 MED ORDER — IPRATROPIUM-ALBUTEROL 0.5-2.5 (3) MG/3ML IN SOLN
3.0000 mL | Freq: Three times a day (TID) | RESPIRATORY_TRACT | Status: DC
  Administered 2015-07-07 (×3): 3 mL via RESPIRATORY_TRACT
  Filled 2015-07-06 (×3): qty 3

## 2015-07-06 MED ORDER — HYDRALAZINE HCL 20 MG/ML IJ SOLN
10.0000 mg | INTRAMUSCULAR | Status: DC | PRN
  Administered 2015-07-08 (×2): 10 mg via INTRAVENOUS
  Filled 2015-07-06 (×2): qty 1

## 2015-07-06 MED ORDER — METOPROLOL TARTRATE 1 MG/ML IV SOLN: 5.0000 mg | Freq: Three times a day (TID) | INTRAVENOUS | Status: DC

## 2015-07-06 MED ORDER — DILTIAZEM HCL 25 MG/5ML IV SOLN
10.0000 mg | Freq: Once | INTRAVENOUS | Status: AC
  Administered 2015-07-06: 10 mg via INTRAVENOUS
  Filled 2015-07-06: qty 5

## 2015-07-06 MED ORDER — IPRATROPIUM-ALBUTEROL 0.5-2.5 (3) MG/3ML IN SOLN
3.0000 mL | Freq: Four times a day (QID) | RESPIRATORY_TRACT | Status: DC
  Administered 2015-07-06 (×3): 3 mL via RESPIRATORY_TRACT
  Filled 2015-07-06 (×4): qty 3

## 2015-07-06 MED ORDER — AMIODARONE LOAD VIA INFUSION
150.0000 mg | Freq: Once | INTRAVENOUS | Status: AC
  Administered 2015-07-06: 150 mg via INTRAVENOUS
  Filled 2015-07-06: qty 83.34

## 2015-07-06 MED ORDER — MORPHINE SULFATE (PF) 2 MG/ML IV SOLN
1.0000 mg | Freq: Once | INTRAVENOUS | Status: AC
  Administered 2015-07-06: 1 mg via INTRAVENOUS
  Filled 2015-07-06: qty 1

## 2015-07-06 MED ORDER — ALBUTEROL SULFATE (2.5 MG/3ML) 0.083% IN NEBU: 2.5000 mg | INHALATION_SOLUTION | RESPIRATORY_TRACT | Status: DC | PRN

## 2015-07-06 MED ORDER — METOPROLOL TARTRATE 1 MG/ML IV SOLN
2.5000 mg | Freq: Two times a day (BID) | INTRAVENOUS | Status: DC
  Administered 2015-07-06 – 2015-07-08 (×5): 2.5 mg via INTRAVENOUS
  Filled 2015-07-06 (×6): qty 5

## 2015-07-06 MED ORDER — NITROGLYCERIN 0.4 MG SL SUBL
0.4000 mg | SUBLINGUAL_TABLET | SUBLINGUAL | Status: DC | PRN
  Administered 2015-07-06 (×2): 0.4 mg via SUBLINGUAL

## 2015-07-06 MED ORDER — PANTOPRAZOLE SODIUM 40 MG IV SOLR
40.0000 mg | Freq: Two times a day (BID) | INTRAVENOUS | Status: DC
  Administered 2015-07-06 (×2): 40 mg via INTRAVENOUS
  Filled 2015-07-06 (×2): qty 40

## 2015-07-06 MED ORDER — SODIUM CHLORIDE 0.9 % IV SOLN: INTRAVENOUS | Status: DC

## 2015-07-06 NOTE — Progress Notes (Addendum)
[facility] PROGRESS NOTE  Rennae Waldner ION:[id number] DOB: 12-05-1923 DOA: 07/05/2015 PCP: Kirt Boys, DO  Assessment/Plan: 1-Dyspnea;   Patient complaining of dyspnea, she is Ussing accessory muscle to breath.  -Transfer to step down unit.  -Nebulizer Tx, IV solumedrol.  -ABG, Chest x ray stat. If pulmonary edema, will give lasix.  -BIPAP PRN for increase work of breathing.  -Check EKG and cycle troponin.   Subsequently patient reported chest pain, pressure. She was still having difficulty breathing.  HR increase to 150. Nitroglycerin, IV metoprolol given.  IV morphine. Patient was started on BIPAP for increase work of breathing.  Cardiology consulted.. Started Cardizem Gtt.  CCM consulted, discussion with Daughter, patient is DNI.  Transfer t telemetry.  Continue to cycle enzymes.   2-HTN; hydralazine PRN.   3-Dysphagia; barium swallow and endoscopy on hold for now.   4-COPD, Asthma; nebulizer and IV solumedrol.    Code Status: Full Code Family Communication: none at bedside.  Disposition Plan: transfer to step down unit.    Consultants:  GI  Procedures:  none  Antibiotics:  none  HPI/Subjective: Came to see patient, she is short of breath, feels she is breathing worse today. She appears in mild distress, Ussing accessory muscle to breath. She report been short of breath since the 19, she went to urgent care and was give prednisone.    Objective: Filed Vitals:   07/06/15 0610 07/06/15 0614  BP: 138/103 150/93  Pulse: 63 117  Temp: 97.5 F (36.4 C)   Resp: 16     Intake/Output Summary (Last 24 hours) at 07/06/15 0902 Last data filed at 07/05/15 1742  Gross per 24 hour  Intake    500 ml  Output      0 ml  Net    500 ml   Filed Weights   07/06/15 0610  Weight: 60.4 kg (133 lb 2.5 oz)    Exam:   General:  Mild distress, difficulty breathing, twitching of eyes per patient this is old.   Cardiovascular: S 1, S 2 RRR  Respiratory:  bilateral fine crackles.   Abdomen: Bs present soft, nt  Musculoskeletal: no edema  Data Reviewed: Basic Metabolic Panel:  Recent Labs Lab 07/05/15 1709 07/06/15 0411  NA 138 139  K 3.9 4.0  CL 105 107  CO2 20* 21*  GLUCOSE 101* 100*  BUN 37* 31*  CREATININE 1.62* 1.49*  CALCIUM 10.1 9.1   Liver Function Tests:  Recent Labs Lab 07/05/15 1709  AST 24  ALT 24  ALKPHOS 63  BILITOT 0.9  PROT 7.1  ALBUMIN 3.8   No results for input(s): LIPASE, AMYLASE in the last 168 hours. No results for input(s): AMMONIA in the last 168 hours. CBC:  Recent Labs Lab 07/05/15 1709  WBC 7.1  NEUTROABS 4.7  HGB 12.5  HCT 35.6*  MCV 89.4  PLT 268   Cardiac Enzymes:  Recent Labs Lab 07/05/15 1709  TROPONINI <0.03   BNP (last 3 results)  Recent Labs  06/20/15 1807  BNP 238.3*    ProBNP (last 3 results) No results for input(s): PROBNP in the last 8760 hours.  CBG: No results for input(s): GLUCAP in the last 168 hours.  No results found for this or any previous visit (from the past 240 hour(s)).   Studies: Ct Abdomen Pelvis Wo Contrast  07/05/2015  CLINICAL DATA:  Bloating, difficulty swallowing, history small-bowel obstruction, ventral hernia, GERD, stomach ulcers, hypertension, type II diabetes mellitus, COPD, asthma EXAM: CT ABDOMEN AND  PELVIS WITHOUT CONTRAST TECHNIQUE: Multidetector CT imaging of the abdomen and pelvis was performed following the standard protocol without IV contrast. Sagittal and coronal MPR images reconstructed from axial data set. Patient drank dilute oral contrast for exam. COMPARISON:  01/10/2015 FINDINGS: Lung bases clear. Multiple low-attenuation hepatic foci likely cysts, unchanged. Gallbladder surgically absent. Remainder of liver, spleen, pancreas, kidneys, and adrenal glands normal. Extensive atherosclerotic calcifications. Questionable mild wall thickening of distal esophagus. Increased thickening of the gastric wall versus the previous  exam, nonspecific. Small ventral hernia containing nonobstructed bowel loop. Scattered colonic diverticulosis without evidence of diverticulitis or bowel obstruction. Bladder, ureters, uterus and adnexa normal appearance. Appendix not visualized. No mass, adenopathy, free air or free fluid. Bones demineralized. IMPRESSION: No definite acute intra abdominal abnormalities. Questionable mild wall thickening of the gastric wall versus artifact from underdistention ; consider followup endoscopy or upper GI assessment if patient has persistent symptoms. Electronically Signed   By: Ulyses Southward M.D.   On: 07/05/2015 19:53   Dg Chest 1 View  07/05/2015  CLINICAL DATA:  Dysphagia EXAM: CHEST 1 VIEW COMPARISON:  06/20/2015 FINDINGS: There is unchanged mild cardiomegaly. The lungs are clear. No large effusions. Normal pulmonary vasculature. Unremarkable hilar and mediastinal contours, unchanged. IMPRESSION: Cardiomegaly, unchanged.  No acute cardiopulmonary findings. Electronically Signed   By: Ellery Plunk M.D.   On: 07/05/2015 22:29    Scheduled Meds: . cycloSPORINE  1 drop Both Eyes BID  . fluticasone  2 spray Each Nare Daily  . heparin  5,000 Units Subcutaneous 3 times per day  . latanoprost  1 drop Both Eyes QHS  . mometasone-formoterol  2 puff Inhalation BID  . pantoprazole (PROTONIX) IV  80 mg Intravenous Once  . polyvinyl alcohol  1 drop Both Eyes QID   Continuous Infusions: . sodium chloride 75 mL/hr at 07/05/15 2106  . sodium chloride      Active Problems:   Dysphagia    Time spent: 35 minutes.     [name]  [facility] Pager [phone]. If 7PM-7AM, please contact night-coverage at [email or web address], password [facility] 07/06/2015, 9:02 AM  LOS: 1 day

## 2015-07-06 NOTE — ED Notes (Signed)
1st attempt to call report 

## 2015-07-06 NOTE — Progress Notes (Signed)
NURSING PROGRESS NOTE  Shannon [name] UB:[id number] Admission Data: 07/06/2015 6:57 AM Attending Provider: Etta Quill, DO [id number], Monica, DO Code Status: FULL  Allergies:  Review of patient's allergies indicates no known allergies. Past Medical History:   has a past medical history of Pneumonia; Hyperlipidemia; Hypertension; Small bowel obstruction ([place]); Ventral hernia with bowel obstruction; Asthma; Hyperlipidemia; Stroke [facility]); COPD (chronic obstructive pulmonary disease) ([place]); Hypothyroidism; Type II diabetes mellitus ([place]); Anemia; History of blood transfusion; GERD (gastroesophageal reflux disease); History of stomach ulcers; Arthritis; Renal insufficiency; Depression; and SIADH (syndrome of inappropriate ADH production) ([place]). Past Surgical History:   has past surgical history that includes Tubal ligation; Colectomy; Hernia repair (2015); Esophagogastroduodenoscopy (N/A, 01/19/2014); Esophagogastroduodenoscopy (N/A, 01/20/2014); Esophagogastroduodenoscopy (N/A, 03/19/2014); Cholecystectomy open; Ventral hernia repair (2015); Cataract extraction w/ intraocular lens  implant, bilateral; Gastrojejunostomy; laparotomy (N/A, 01/20/2015); and Lysis of adhesion (N/A, 01/20/2015). Social History:   reports that she has never smoked. She has quit using smokeless tobacco. Her smokeless tobacco use included Snuff. She reports that she does not drink alcohol or use illicit drugs.  Shannon [name] is a 82 y.o. female patient admitted from ED:   Last Documented Vital Signs: Blood pressure 150/93, pulse 117, temperature 97.5 F (36.4 C), temperature source Oral, resp. rate 16, height 5\' 6"  (1.676 m), weight 60.4 kg (133 lb 2.5 oz), SpO2 100 %.  Cardiac Monitoring: Box # 2 in place. Cardiac monitor yields:normal sinus rhythm.  IV Fluids:  IV in place, occlusive dsg intact without redness, IV cath antecubital left, condition patent and no redness none.   Skin: dry and intact. Appropriate for  ethnicity.  Patientorientated to room. Information packet given to patient. Admission inpatient armband information verified with patient to include name and date of birth and placed on patient arm. Side rails up x 2, fall assessment and education completed with patient. Patient able to verbalize understanding of risk associated with falls and verbalized understanding to call for assistance before getting out of bed. Call light within reach. Patient able to voice and demonstrate understanding of unit orientation instructions.    Will continue to evaluate and treat per MD orders.  Clydell Hakim RN, BSN

## 2015-07-06 NOTE — Progress Notes (Signed)
Called to bedside for neb treatment. Upon arrival, Rapid response at bedside with neb running. Pt increased WOB. ABG and BIPAP we already ordered. Attempted BIPAP briefly due to WOB. Pt still no change on BIPAP and seemed to make WOB worse. RT took BIPAP off and placed pt back on 2L [place]. Attending MD, Richardson Landry, NP at bedside. RT awaiting further orders.

## 2015-07-06 NOTE — Consult Note (Signed)
CARDIOLOGY CONSULT NOTE   Patient ID: Shannon [name] MRN: [id number] DOB/AGE: 07/25/1923 82 y.o.  Admit date: 07/05/2015  Primary Physician   Kirt Boys, DO Primary Cardiologist: Dr. Delton See Reason for Consultation   Rapid atrial flutter  Patient Profile: 82 yo female w/ PMH of Type 2 DM, CKD Stage 3, nonobstructive CAD and HTN admitted 04/03 w/ dysphagia.   Today went into rapid atrial flutter with chest pain and SOB. Cards asked to see.  Shannon [name] states that she has not had any chest pain recently. Her daughter concurs with this. Shannon [name] states that she has never had rapid palpitations such as she had today. Her daughter agrees that she has never complained of anything like this.  Today, Shannon [name] began getting more short of breath when she came back from an aborted GI procedure. She was also then complaining of chest pain. The pain was not severe. An ECG was performed which showed sinus rhythm with no acute ischemic changes.   Her chest pain increased and her respiratory rate increased. She was placed on BiPAP but did not tolerate it. A blood gas was performed which showed an elevated pH, low CO2 and decreased bicarbonate, but she had an elevated oxygen level so was not hypoxic. To help her respiratory status, she was getting a breathing treatment, which she states helped a little bit.   By this time, the chest pain had increased to a 10/10. She had also gone into rapid atrial flutter, confirmed by ECG. She was given sublingual nitroglycerin for the chest pain which helped a bit. She states the pain had been a 10/10 and is now down to an 8/10. She notes that it initially was hurting around left side of her chest and was worse with deep inspiration. Shannon [name] was also given morphine 2 mg to help the pain.  Once she was a little calm her and was able to slow her respiratory rate down, she seemed to be doing a bit better. She also got IV metoprolol totaling 5 mg, which helped her  heart rate little bit. She was started on IV Cardizem with 10 mg bolus and a drip that was increased from 5 up to 10 mg/h. On this, her heart rate was much better controlled. Her chest pain decreased greatly with improvement in her heart rate control.  Currently her heart rate is slightly greater than 100, her systolic blood pressures well controlled, and her respiratory rate as well as her work of breathing or much closer to normal.   Past Medical History  Diagnosis Date  . Pneumonia   . Hyperlipidemia   . Hypertension   . Small bowel obstruction (HCC)     "I don't know how many" (01/11/2015)  . Ventral hernia with bowel obstruction   . Asthma   . Hyperlipidemia   . Stroke (HCC)     "light one"  . COPD (chronic obstructive pulmonary disease) (HCC)   . Hypothyroidism   . Type II diabetes mellitus (HCC)   . Anemia     previous blood transfusions  . GERD (gastroesophageal reflux disease)   . History of stomach ulcers   . Arthritis     "all over"  . Renal insufficiency   . Depression     "light case"  . SIADH (syndrome of inappropriate ADH production) (HCC)     [name] 01/10/2015     Past Surgical History  Procedure Laterality Date  . Tubal ligation    .  Colectomy    . Hernia repair  2015  . Esophagogastroduodenoscopy N/A 01/19/2014    Procedure: ESOPHAGOGASTRODUODENOSCOPY (EGD);  Surgeon: Hilarie Fredrickson, MD;  Location: [facility];  Service: Endoscopy;  Laterality: N/A;  . Esophagogastroduodenoscopy N/A 01/20/2014    Procedure: ESOPHAGOGASTRODUODENOSCOPY (EGD);  Surgeon: Hilarie Fredrickson, MD;  Location: [facility];  Service: Endoscopy;  Laterality: N/A;  . Esophagogastroduodenoscopy N/A 03/19/2014    Procedure: ESOPHAGOGASTRODUODENOSCOPY (EGD);  Surgeon: Rachael Fee, MD;  Location: [facility];  Service: Endoscopy;  Laterality: N/A;  . Cholecystectomy open    . Ventral hernia repair  2015    incarcerated ventral hernia ([facility] 09/2013)/notes 01/10/2015  . Cataract extraction  w/ intraocular lens  implant, bilateral    . Gastrojejunostomy      hx/notes 01/10/2015  . Laparotomy N/A 01/20/2015    Procedure: EXPLORATORY LAPAROTOMY;  Surgeon: Abigail Miyamoto, MD;  Location: [facility] OR;  Service: General;  Laterality: N/A;  . Lysis of adhesion N/A 01/20/2015    Procedure: LYSIS OF ADHESIONS < 1 HOUR;  Surgeon: Abigail Miyamoto, MD;  Location: [facility] OR;  Service: General;  Laterality: N/A;    No Known Allergies  I have reviewed the patient's current medications . cycloSPORINE  1 drop Both Eyes BID  . fluticasone  2 spray Each Nare Daily  . ipratropium-albuterol  3 mL Nebulization Q6H  . latanoprost  1 drop Both Eyes QHS  . methylPREDNISolone (SOLU-MEDROL) injection  40 mg Intravenous Q6H  . metoprolol  2.5 mg Intravenous Q12H  . mometasone-formoterol  2 puff Inhalation BID  . nitroGLYCERIN      . pantoprazole (PROTONIX) IV  80 mg Intravenous Once  . pantoprazole (PROTONIX) IV  40 mg Intravenous Q12H  . polyvinyl alcohol  1 drop Both Eyes QID   . sodium chloride    . diltiazem (CARDIZEM) infusion 10 mg/hr (07/06/15 1135)   albuterol, hydrALAZINE, HYDROmorphone (DILAUDID) injection, iohexol, nitroGLYCERIN  Medication Sig  acetaminophen (TYLENOL) 325 MG tablet Take 325 mg by mouth every 6 (six) hours as needed for mild pain or fever.   ADVAIR DISKUS 250-50 MCG/DOSE AEPB INHALE 1 PUFF INTO THE LUNGS 2 (TWO) TIMES DAILY AS NEEDED FOR SHORTNESS OF BREATH  albuterol (PROVENTIL HFA;VENTOLIN HFA) 108 (90 Base) MCG/ACT inhaler Inhale 2 puffs into the lungs every 6 (six) hours as needed for wheezing or shortness of breath.  amLODipine (NORVASC) 10 MG tablet TAKE 1 TABLET BY MOUTH DAILY FOR HYPERTENSION  aspirin EC 81 MG tablet Take 81 mg by mouth every morning.  atorvastatin (LIPITOR) 10 MG tablet TAKE 1 TABLET BY MOUTH EVERY DAY Patient taking differently: TAKE 1 TABLET BY MOUTH EVERY DAY IN MORNING  b complex vitamins tablet Take 1 tablet by mouth daily.   calcium-vitamin D  (OSCAL WITH D) 500-200 MG-UNIT per tablet Take 1 tablet by mouth daily with breakfast.   chlorhexidine (PERIDEX) 0.12 % solution 15 mLs by Mouth Rinse route 2 (two) times daily. Patient taking differently: 15 mLs by Mouth Rinse route every evening.   cloNIDine (CATAPRES) 0.1 MG tablet Take 1 tablet (0.1 mg total) by mouth 2 (two) times daily.  [employer] GAS RELIEF 80 MG chewable tablet CHEW 1 TABLET BY MOUTH 4 TIMES A DAY AS NEEDED FOR FLATULENCE  diclofenac sodium (VOLTAREN) 1 % GEL Apply 2 g topically daily as needed (for pain).   feeding supplement (BOOST / RESOURCE BREEZE) LIQD Take 1 Container by mouth 3 (three) times daily between meals.  fluticasone (FLONASE) 50 MCG/ACT nasal spray Place  2 sprays into both nostrils daily.  gabapentin (NEURONTIN) 300 MG capsule TAKE 1 CAPSULE (300 MG TOTAL) BY MOUTH 3 (THREE) TIMES DAILY.  guaiFENesin (MUCINEX) 600 MG 12 hr tablet Take 1 tablet (600 mg total) by mouth 2 (two) times daily. Patient taking differently: Take 600 mg by mouth 2 (two) times daily as needed for cough or to loosen phlegm.   hydrALAZINE (APRESOLINE) 100 MG tablet TAKE 1 TABLET (100 MG TOTAL) BY MOUTH 3 (THREE) TIMES DAILY.  isosorbide mononitrate (IMDUR) 30 MG 24 hr tablet Take one tablet by mouth once daily. If Chest Pains occur increase to two tablets daily. Patient taking differently: Take 30 mg by mouth daily.   lactulose (CHRONULAC) 10 GM/15ML solution Take 15 mLs (10 g total) by mouth daily as needed for mild constipation.  latanoprost (XALATAN) 0.005 % ophthalmic solution Place 1 drop into both eyes at bedtime.  levothyroxine (SYNTHROID, LEVOTHROID) 200 MCG tablet Take one tablet by mouth once daily 30 minutes before breakfast for thyroid Patient taking differently: Take 200 mcg by mouth daily before breakfast. Take one tablet by mouth once daily 30 minutes before breakfast for thyroid  metoprolol succinate (TOPROL-XL) 25 MG 24 hr tablet TAKE 1 TABLET (25 MG TOTAL) BY MOUTH DAILY.    Multiple Vitamins-Minerals (MULTIVITAMIN WITH MINERALS) tablet Take 1 tablet by mouth every morning.   pantoprazole (PROTONIX) 40 MG tablet TAKE 1 TABLET BY MOUTH TWICE A DAY  Polyethyl Glycol-Propyl Glycol 0.4-0.3 % SOLN Place 1 drop into both eyes 4 (four) times daily.   polyethylene glycol powder (GLYCOLAX/MIRALAX) powder Take 17 g by mouth 2 (two) times daily as needed. Patient taking differently: Take 17 g by mouth 2 (two) times daily.   RESTASIS 0.05 % ophthalmic emulsion USE 1 DROP INTO BOTH EYES TWICE DAILY  sennosides-docusate sodium (SENOKOT-S) 8.6-50 MG tablet Take 1 tablet by mouth 2 (two) times daily.   sertraline (ZOLOFT) 100 MG tablet TAKE 1 TABLET BY MOUTH ONCE DAILY FOR DEPRESSION  sodium chloride 1 g tablet TAKE 1 TABLET BY MOUTH THREE TIMES DAILY  traMADol (ULTRAM) 50 MG tablet Take 50 mg by mouth 2 (two) times daily as needed for moderate pain.      Social History   Social History  . Marital Status: Widowed    Spouse Name: N/A  . Number of Children: N/A  . Years of Education: N/A   Occupational History  . Not on file.   Social History Main Topics  . Smoking status: Never Smoker   . Smokeless tobacco: Former Neurosurgeon    Types: Snuff  . Alcohol Use: No  . Drug Use: No  . Sexual Activity: No   Other Topics Concern  . Not on file   Social History Narrative   Married in [place], lives in one story house with 2 people and no pets   Occupation: ?   Has a living Will, [place], and doesn't have DNR   Was living with Husband (in frail health) in [place] until 09/2013.  After her [facility] admission they both came to live with daughter in [place]    Family Status  Relation Status Death Age  . Mother Deceased   . Father Deceased   . Maternal Grandmother Deceased   . Maternal Grandfather Deceased   . Paternal Grandmother Deceased   . Paternal Grandfather Deceased    Family History  Problem Relation Age of Onset  . Stroke Mother   . Heart attack Neg Hx   .  Hypertension  Mother   . Diabetes Brother      ROS:  Full 14 point review of systems complete and found to be negative unless listed above.  Physical Exam: Blood pressure 126/78, pulse 115, temperature 97.5 F (36.4 C), temperature source Oral, resp. rate 24, height 5\' 6"  (1.676 m), weight 133 lb 2.5 oz (60.4 kg), SpO2 100 %.  General: Frail, elderly, female in moderate respiratory distress Head: Eyes PERRLA, No xanthomas.   Normocephalic and atraumatic, oropharynx without edema or exudate. Dentition: poor Lungs: Rales bases Heart: Heart rapid and irregular rate and rhythm with S1, S2; no murmur. pulses are 2+ all 4 extrem.   Neck: No carotid bruits. No lymphadenopathy.  JVD elevated to 9 cm. Abdomen: Bowel sounds present, abdomen soft and non-tender without masses or hernias noted. Msk:  No spine or cva tenderness. No weakness, no joint deformities or effusions. Extremities: No clubbing or cyanosis. No edema.  Neuro: Alert and oriented X 3. No focal deficits noted. Psych:  Good affect, responds appropriately Skin: No rashes or lesions noted.  Labs:   ABG    Component Value Date/Time   PHART 7.672* 07/06/2015 1008   PCO2ART 11.9* 07/06/2015 1008   PO2ART 113.0* 07/06/2015 1008   HCO3 14.0* 07/06/2015 1008   TCO2 14.4 07/06/2015 1008   ACIDBASEDEF 6.9* 07/06/2015 1008   O2SAT 98.6 07/06/2015 1008   Lab Results  Component Value Date   WBC 7.1 07/05/2015   HGB 12.5 07/05/2015   HCT 35.6* 07/05/2015   MCV 89.4 07/05/2015   PLT 268 07/05/2015     Recent Labs Lab 07/05/15 1709 07/06/15 0411  NA 138 139  K 3.9 4.0  CL 105 107  CO2 20* 21*  BUN 37* 31*  CREATININE 1.62* 1.49*  CALCIUM 10.1 9.1  PROT 7.1  --   BILITOT 0.9  --   ALKPHOS 63  --   ALT 24  --   AST 24  --   GLUCOSE 101* 100*  ALBUMIN 3.8  --     Recent Labs  07/05/15 1709 07/06/15 0945  TROPONINI <0.03 0.04*   TSH  Date/Time Value Ref Range Status  06/16/2015 11:21 AM 0.009* 0.450 - 4.500  uIU/mL Final    Echo: 01/14/2015 - Left ventricle: The cavity size was normal. Systolic function was  normal. The estimated ejection fraction was in the range of 55%  to 60%. Wall motion was normal; there were no regional wall  motion abnormalities. - Aortic valve: There was mild to moderate regurgitation.  Regurgitation pressure half-time: 365 ms. - Left atrium: The atrium was severely dilated. - Right ventricle: The cavity size was normal. Wall thickness was  normal. Systolic function was normal. - Tricuspid valve: There was mild regurgitation. - Pulmonary arteries: PA peak pressure: 42 mm Hg (S). - Inferior vena cava: The vessel was normal in size. The  respirophasic diameter changes were in the normal range (>= 50%),  consistent with normal central venous pressure.  ECG:  1. 07/06/2015, 10:14 AM; sinus tachycardia, heart rate 108 2. 07/06/2015, 10:45 AM; atrial flutter with rapid ventricular response, dated did not transmit correctly, see paper chart 3. 07/06/2015, 11:41 AM; atrial flutter with rapid ventricular response, but slower than previous ECG, heart rate 118  Radiology:  Ct Abdomen Pelvis Wo Contrast 07/05/2015  CLINICAL DATA:  Bloating, difficulty swallowing, history small-bowel obstruction, ventral hernia, GERD, stomach ulcers, hypertension, type II diabetes mellitus, COPD, asthma EXAM: CT ABDOMEN AND PELVIS WITHOUT CONTRAST TECHNIQUE: Multidetector CT imaging of  the abdomen and pelvis was performed following the standard protocol without IV contrast. Sagittal and coronal MPR images reconstructed from axial data set. Patient drank dilute oral contrast for exam. COMPARISON:  01/10/2015 FINDINGS: Lung bases clear. Multiple low-attenuation hepatic foci likely cysts, unchanged. Gallbladder surgically absent. Remainder of liver, spleen, pancreas, kidneys, and adrenal glands normal. Extensive atherosclerotic calcifications. Questionable mild wall thickening of distal esophagus.  Increased thickening of the gastric wall versus the previous exam, nonspecific. Small ventral hernia containing nonobstructed bowel loop. Scattered colonic diverticulosis without evidence of diverticulitis or bowel obstruction. Bladder, ureters, uterus and adnexa normal appearance. Appendix not visualized. No mass, adenopathy, free air or free fluid. Bones demineralized. IMPRESSION: No definite acute intra abdominal abnormalities. Questionable mild wall thickening of the gastric wall versus artifact from underdistention ; consider followup endoscopy or upper GI assessment if patient has persistent symptoms. Electronically Signed   By: Ulyses Southward M.D.   On: 07/05/2015 19:53   Dg Chest 1 View 07/05/2015  CLINICAL DATA:  Dysphagia EXAM: CHEST 1 VIEW COMPARISON:  06/20/2015 FINDINGS: There is unchanged mild cardiomegaly. The lungs are clear. No large effusions. Normal pulmonary vasculature. Unremarkable hilar and mediastinal contours, unchanged. IMPRESSION: Cardiomegaly, unchanged.  No acute cardiopulmonary findings. Electronically Signed   By: Ellery Plunk M.D.   On: 07/05/2015 22:29   Dg Chest Port 1 View 07/06/2015  CLINICAL DATA:  Dyspnea EXAM: PORTABLE CHEST 1 VIEW COMPARISON:  07/05/2015 FINDINGS: Cardiomediastinal silhouette is stable. No segmental infiltrate or pleural effusion. Central mild vascular congestion without convincing pulmonary edema. Elevation of the right hemidiaphragm again noted. IMPRESSION: No segmental infiltrate or pleural effusion. Central mild vascular congestion without convincing pulmonary edema. Elevation of the right hemidiaphragm again noted. Electronically Signed   By: Natasha Mead M.D.   On: 07/06/2015 09:58    ASSESSMENT AND PLAN:   The patient was seen today by Dr. Delton See, the patient evaluated and the data reviewed.   1.   Atrial flutter with rapid ventricular response (HCC) - sudden onset in the setting of respiratory distress. - It is likely she went into atrial  flutter after she got a nebulizer, however she has had nebulizers before without atrial arrhythmias of which we are aware - CHADS2VASC=7 - If she does not spontaneously convert to sinus rhythm, a decision will have to be made regarding anticoagulation - For now, continue Cardizem  2.  Chest pain - The chest pain started before the arrhythmia started, but became much worse after she went into rapid atrial flutter - Agree with cycling enzymes, but she has had mildly elevated cardiac enzymes in the setting of other acute illnesses. - Her EF was normal on echocardiogram 6 months ago - If her chest pain resolves with control of her other medical conditions including heart rate, and her cardiac enzymes are not significantly elevated, consider medical therapy  3.  Respiratory distress - CCM/IM are managing - Vascular congestion was noted on chest x-ray and she had Lasix 40 mg IV  Otherwise, per IM/CCM Principal Problem:   Dysphagia Active Problems:   CVA (cerebral vascular accident) (HCC)   CKD (chronic kidney disease) stage 3, GFR 30-59 ml/min   Signed: Leanna Battles 07/06/2015 12:46 PM Beeper [phone]  Co-Sign MD  The patient was seen, examined and discussed with Theodore Demark, PA-C and I agree with the above.   82 year old female admitted who was admitted with dysphagia, she developed an acute atrial flutter with RVR with respiratory distress.  She appears  fluid overloaded on physical exam with mild respiratory distress. Considering her age and comorbidities and an acute a nd new diagnosis of atrial flutter I would start her on amiodarone drip with hope on cardioversion before 48 hours so we don't have to anticoagulate long term. She has normal LVEF. If she is still in a-flutter with RVR by tomorrow we would start anticoagulation. Her recent TSH in 06/2015 was 0.009, we will repeat. She is on Synthroid, potentially on a high dose.   Lars Masson 07/06/2015\

## 2015-07-06 NOTE — Progress Notes (Addendum)
Pt transported from radiology around 9 am. MD at bedside, concerned for increased work of breathing. HR 74 on teley. MD placed orders.  At [phone] Rapid Response RN contacted per MD order, Pt still labored breathing. Respiratory therapy and Rapid response stated they would come to bedside.  09:46 AM Stat Chest X Ray resulted.  At 0955 Rapid response arrived to bedside and administered breathing trx. MD at bedside.  10:24 AM SL nitro given. Patient complaining of chest pain. Last BP 167/68 HR 115  10:25 AM IV lasix given   10:26 AM 2.5 mg IV metoprolol given   10:33 AM Patient still complaining of chest pain. SL nitro given. MD at bedside. Last BP 132/72 HR 125  10:38 AM MD verbal order Morphine 1 mg given  10:40 AM MD verbal order for 12 lead STAT and 2.5 mg IV metoprolol now   12 Lead resulted afib/aflutter. HR sustaining 150's  11:00 AM Pulmonary Critical Care and Cardiology at bedside, orders placed for cardizem drip. Orders given for Pt to transfer to stepdown. Family updated and present at bedside.  12:04 PM Report given to [place] on [address]

## 2015-07-06 NOTE — Progress Notes (Signed)
Full consult to follow.  Chart reviewed. Patient was sent down to radiology for barium swallow. If she has that done, she cannot have an EGD the same day b/c barium coating the esophageal mucosa. I have contacted rads and canceled the study - they had not yet given her any barium.  We will get her down to the endoscopy lab later this AM for an EGD.

## 2015-07-06 NOTE — Progress Notes (Signed)
Initial Nutrition Assessment  DOCUMENTATION CODES:   Not applicable  INTERVENTION:   -RD will follow for diet advancement and supplement as appropriate  NUTRITION DIAGNOSIS:   Inadequate oral intake related to inability to eat as evidenced by NPO status.  GOAL:   Patient will meet greater than or equal to 90% of their needs  MONITOR:   Diet advancement, Labs, Weight trends, Skin, I & O's, Other (Comment)  REASON FOR ASSESSMENT:   Malnutrition Screening Tool    ASSESSMENT:   Shannon [name] is a 82 y.o. female who presents to the ED with c/o feeling of food getting stuck when swallowing. Patient has been on pureed diet ever since her last operation in October. Patient has history of perforated gastric ulcer in 2015, lysis of adhesions due to SBO in October 2016.  Pt admitted with dysphagia.   Per H&P, pt was complaining of food getting stuck in her throat when swallowing. Pt has been following a dysphagia 1 diet since 01/2015, since undergoing lysis of adhesions for SBO.   Attempted to evaluate pt, however, rapid response was called due to respiratory distress at time of RD visit and multiple healthcare providers and physicians were in room at time of visit. Pt experiencing respiratory distress. Plan is for pt to transfer to SDU.   GI following; plan to undergo EGD with dilation potentially on 07/07/15.    Unable to complete Nutrition-Focused physical exam at this time.   Wt hx reviewed, which revealed that pt was ranged from 130-140# over the past year.   Labs reviewed: CBGS: 101.   Diet Order:  Diet NPO time specified  Skin:  Reviewed, no issues  Last BM:  07/06/15  Height:   Ht Readings from Last 1 Encounters:  07/06/15 5\' 6"  (1.676 m)    Weight:   Wt Readings from Last 1 Encounters:  07/06/15 133 lb 2.5 oz (60.4 kg)    Ideal Body Weight:  59.1 kg  BMI:  Body mass index is 21.5 kg/(m^2).  Estimated Nutritional Needs:   Kcal:  1350-1550  Protein:   55-65 grams  Fluid:  1.3-1.5 L  EDUCATION NEEDS:   No education needs identified at this time  Jacoby Zanni A. Jimmye Norman, RD, LDN, CDE Pager: [phone] After hours Pager: [phone]

## 2015-07-06 NOTE — Progress Notes (Signed)
Previously called by 3W staff RN to inform PCCM that the daughter had questions regarding code status.  Attempted to call daughter Soledad Gerlach on cell [phone]) to clarify code status.  She currently is a LCB - no CPR, no defib and no intubation.  Await return call from family.     Noe Gens, NP-C [facility] Pgr: [phone] or if no answer [phone] 07/06/2015, 4:14 PM

## 2015-07-06 NOTE — Consult Note (Signed)
[facility] Consult: 11:15 AM 07/06/2015  LOS: 1 day    Referring Provider: Dr Tyrell Antonio.  Primary Care Physician:  Gildardo Cranker, DO Primary Gastroenterologist:  Dr. Scarlette Shorts    Reason for Consultation:  Dysphagia.    HPI: Shannon [name] is a 81 y.o. female.  Type 2 DM.  Chronic normocytic anemia. "light stroke", not on anticoagulation.  Hypothyroidism.    S/p remote gastrojejunostomy likely for ulcer.  09/2013 surgery for ulcer associated perforation at [place] anastomosis involving resection and revision of GJ anastomosis along with incisional herniorrhaphy.  01/2014 admission with GOO and EGDs x 2 showing gastric bezoar.   Recurrent bezoar diagnosed on CT 03/2014.  03/19/2014 EGD: > 500 cc retained fluid; tortuous, narrowed distal stomach but no clear stricture or ulcers and mucosa normal at stomach and duodenum; able to pass scope beyond stomach with minimal pressure.   It was felt her distorted distal gastric anatomy was causing at least partial gastric outlet obstruction.  03/25/16 UGI/SBFT: normal.  Recurrent n/v, functional GOO 05/2014 , CT did not show bezoar.  05/22/14  esophagram: small paraesophageal hernia, barium trickled into duodenum. No contrast extravasation.  Reglan was added, NGT placed;  Dr Carlean Purl felt this is/was functional issue related to prior vagotomy and was likely to recurr.  At GI ROV 06/10/14 pt doing well on purrees, Boost, careful eating rituals, Protonix 40 BID, Reglan was discontinued  01/2015 had 17 day admission with SBO. S/p 10/19/16ex lap with LOA  She remains on BID Protonix and no Reglan and adherent to pureed diet.    Pt presented to ED yesterday afternoon c/o purees getting stuck in esophagus, happening for last 2 days.  Current weight is 7 # lower than 3 weeks ago.  This AM,  while awaiting EGD, developed resp distress, chest pain and found to be in rapid afib/flutter.  Rapid response team activated and plan to transfer her to ICU and start Cardizem.  EGD cancelled.  Transferred to 3 [place], tele bed.  Partial code status established.  Breathing improved. In addition to 2 to 3 days of choking on puree, she regurgitates mucous.  Also having sense of gastric fullness.  Daily BMs.      Past Medical History  Diagnosis Date  . Pneumonia   . Hyperlipidemia   . Hypertension   . Small bowel obstruction ([place])     "I don't know how many" (01/11/2015)  . Ventral hernia with bowel obstruction   . Asthma   . Hyperlipidemia   . Stroke ([place])     "light one"  . COPD (chronic obstructive pulmonary disease) ([place])   . Hypothyroidism   . Type II diabetes mellitus ([place])   . Anemia     previous blood transfusions  . GERD (gastroesophageal reflux disease)   . History of stomach ulcers   . Arthritis     "all over"  . Renal insufficiency   . Depression     "light case"  . SIADH (syndrome of inappropriate ADH production) ([place])     [name] 01/10/2015  Past Surgical History  Procedure Laterality Date  . Tubal ligation    . Colectomy    . Hernia repair  2015  . Esophagogastroduodenoscopy N/A 01/19/2014    Procedure: ESOPHAGOGASTRODUODENOSCOPY (EGD);  Surgeon: Irene Shipper, MD;  Location: [facility];  Service: Endoscopy;  Laterality: N/A;  . Esophagogastroduodenoscopy N/A 01/20/2014    Procedure: ESOPHAGOGASTRODUODENOSCOPY (EGD);  Surgeon: Irene Shipper, MD;  Location: [facility];  Service: Endoscopy;  Laterality: N/A;  . Esophagogastroduodenoscopy N/A 03/19/2014    Procedure: ESOPHAGOGASTRODUODENOSCOPY (EGD);  Surgeon: Milus Banister, MD;  Location: [facility];  Service: Endoscopy;  Laterality: N/A;  . Cholecystectomy open    . Ventral hernia repair  2015    incarcerated ventral hernia ([facility] 09/2013)/notes 01/10/2015  . Cataract extraction w/ intraocular lens   implant, bilateral    . Gastrojejunostomy      hx/notes 01/10/2015  . Laparotomy N/A 01/20/2015    Procedure: EXPLORATORY LAPAROTOMY;  Surgeon: Coralie Keens, MD;  Location: [facility];  Service: General;  Laterality: N/A;  . Lysis of adhesion N/A 01/20/2015    Procedure: LYSIS OF ADHESIONS < 1 HOUR;  Surgeon: Coralie Keens, MD;  Location: [facility];  Service: General;  Laterality: N/A;    Prior to Admission medications   Medication Sig Start Date End Date Taking? Authorizing Provider  acetaminophen (TYLENOL) 325 MG tablet Take 325 mg by mouth every 6 (six) hours as needed for mild pain or fever.    Yes Historical Provider, MD  ADVAIR DISKUS 250-50 MCG/DOSE AEPB INHALE 1 PUFF INTO THE LUNGS 2 (TWO) TIMES DAILY AS NEEDED FOR SHORTNESS OF BREATH 12/01/14  Yes Historical Provider, MD  albuterol (PROVENTIL HFA;VENTOLIN HFA) 108 (90 Base) MCG/ACT inhaler Inhale 2 puffs into the lungs every 6 (six) hours as needed for wheezing or shortness of breath. 06/04/15  Yes Monica Carter, DO  amLODipine (NORVASC) 10 MG tablet TAKE 1 TABLET BY MOUTH DAILY FOR HYPERTENSION 03/15/15  Yes Gildardo Cranker, DO  aspirin EC 81 MG tablet Take 81 mg by mouth every morning.   Yes Historical Provider, MD  atorvastatin (LIPITOR) 10 MG tablet TAKE 1 TABLET BY MOUTH EVERY DAY Patient taking differently: TAKE 1 TABLET BY MOUTH EVERY DAY IN MORNING 04/08/15  Yes Gildardo Cranker, DO  b complex vitamins tablet Take 1 tablet by mouth daily.    Yes Historical Provider, MD  calcium-vitamin D (OSCAL WITH D) 500-200 MG-UNIT per tablet Take 1 tablet by mouth daily with breakfast.    Yes Historical Provider, MD  chlorhexidine (PERIDEX) 0.12 % solution 15 mLs by Mouth Rinse route 2 (two) times daily. Patient taking differently: 15 mLs by Mouth Rinse route every evening.  03/26/14  Yes Robbie Lis, MD  cloNIDine (CATAPRES) 0.1 MG tablet Take 1 tablet (0.1 mg total) by mouth 2 (two) times daily. 07/13/14  Yes Dorothy Spark, MD  [employer] GAS RELIEF 80  MG chewable tablet CHEW 1 TABLET BY MOUTH 4 TIMES A DAY AS NEEDED FOR FLATULENCE 06/28/15  Yes Gildardo Cranker, DO  diclofenac sodium (VOLTAREN) 1 % GEL Apply 2 g topically daily as needed (for pain).    Yes Historical Provider, MD  feeding supplement (BOOST / RESOURCE BREEZE) LIQD Take 1 Container by mouth 3 (three) times daily between meals. 01/27/15  Yes Shanker Kristeen Mans, MD  fluticasone (FLONASE) 50 MCG/ACT nasal spray Place 2 sprays into both nostrils daily. 08/07/14  Yes Gildardo Cranker, DO  gabapentin (NEURONTIN) 300 MG capsule TAKE 1 CAPSULE (300 MG TOTAL) BY  MOUTH 3 (THREE) TIMES DAILY. 05/31/15  Yes Gildardo Cranker, DO  guaiFENesin (MUCINEX) 600 MG 12 hr tablet Take 1 tablet (600 mg total) by mouth 2 (two) times daily. Patient taking differently: Take 600 mg by mouth 2 (two) times daily as needed for cough or to loosen phlegm.  06/20/15  Yes Dorie Rank, MD  hydrALAZINE (APRESOLINE) 100 MG tablet TAKE 1 TABLET (100 MG TOTAL) BY MOUTH 3 (THREE) TIMES DAILY. 05/31/15  Yes Gildardo Cranker, DO  isosorbide mononitrate (IMDUR) 30 MG 24 hr tablet Take one tablet by mouth once daily. If Chest Pains occur increase to two tablets daily. Patient taking differently: Take 30 mg by mouth daily.  07/22/14  Yes Monica Carter, DO  lactulose (CHRONULAC) 10 GM/15ML solution Take 15 mLs (10 g total) by mouth daily as needed for mild constipation. 06/02/14  Yes Mahima Pandey, MD  latanoprost (XALATAN) 0.005 % ophthalmic solution Place 1 drop into both eyes at bedtime. 06/02/14  Yes Mahima Bubba Camp, MD  levothyroxine (SYNTHROID, LEVOTHROID) 200 MCG tablet Take one tablet by mouth once daily 30 minutes before breakfast for thyroid Patient taking differently: Take 200 mcg by mouth daily before breakfast. Take one tablet by mouth once daily 30 minutes before breakfast for thyroid 07/05/15  Yes Gildardo Cranker, DO  metoprolol succinate (TOPROL-XL) 25 MG 24 hr tablet TAKE 1 TABLET (25 MG TOTAL) BY MOUTH DAILY. 06/17/15  Yes [name], NP   Multiple Vitamins-Minerals (MULTIVITAMIN WITH MINERALS) tablet Take 1 tablet by mouth every morning.    Yes Historical Provider, MD  pantoprazole (PROTONIX) 40 MG tablet TAKE 1 TABLET BY MOUTH TWICE A DAY 08/07/14  Yes Gildardo Cranker, DO  Polyethyl Glycol-Propyl Glycol 0.4-0.3 % SOLN Place 1 drop into both eyes 4 (four) times daily.    Yes Historical Provider, MD  polyethylene glycol powder (GLYCOLAX/MIRALAX) powder Take 17 g by mouth 2 (two) times daily as needed. Patient taking differently: Take 17 g by mouth 2 (two) times daily.  03/12/15  Yes Monica Carter, DO  RESTASIS 0.05 % ophthalmic emulsion USE 1 DROP INTO BOTH EYES TWICE DAILY 10/19/14  Yes Gildardo Cranker, DO  sennosides-docusate sodium (SENOKOT-S) 8.6-50 MG tablet Take 1 tablet by mouth 2 (two) times daily.    Yes Historical Provider, MD  sertraline (ZOLOFT) 100 MG tablet TAKE 1 TABLET BY MOUTH ONCE DAILY FOR DEPRESSION 07/05/15  Yes Gildardo Cranker, DO  sodium chloride 1 g tablet TAKE 1 TABLET BY MOUTH THREE TIMES DAILY 05/19/15  Yes Gildardo Cranker, DO  traMADol (ULTRAM) 50 MG tablet Take 50 mg by mouth 2 (two) times daily as needed for moderate pain.    Yes Historical Provider, MD    Scheduled Meds: . cycloSPORINE  1 drop Both Eyes BID  . diltiazem  10 mg Intravenous Once  . fluticasone  2 spray Each Nare Daily  . ipratropium-albuterol  3 mL Nebulization Q6H  . latanoprost  1 drop Both Eyes QHS  . methylPREDNISolone (SOLU-MEDROL) injection  40 mg Intravenous Q6H  . metoprolol  2.5 mg Intravenous Q12H  . mometasone-formoterol  2 puff Inhalation BID  . nitroGLYCERIN      . pantoprazole (PROTONIX) IV  80 mg Intravenous Once  . pantoprazole (PROTONIX) IV  40 mg Intravenous Q12H  . polyvinyl alcohol  1 drop Both Eyes QID   Infusions: . sodium chloride    . diltiazem (CARDIZEM) infusion     PRN Meds: albuterol, hydrALAZINE, HYDROmorphone (DILAUDID) injection, iohexol, nitroGLYCERIN   Allergies as of 07/05/2015  . (  No Known Allergies)     Family History  Problem Relation Age of Onset  . Stroke Mother   . Heart attack Neg Hx   . Hypertension Mother   . Diabetes Brother     Social History   Social History  . Marital Status: Widowed    Spouse Name: N/A  . Number of Children: N/A  . Years of Education: N/A   Occupational History  . Not on file.   Social History Main Topics  . Smoking status: Never Smoker   . Smokeless tobacco: Former Systems developer    Types: Snuff  . Alcohol Use: No  . Drug Use: No  . Sexual Activity: No   Other Topics Concern  . Not on file   Social History Narrative   Married in [place], lives in one story house with 2 people and no pets   Occupation: ?   Has a living Will, [place], and doesn't have DNR   Was living with Husband (in frail health) in [place] until 09/2013.  After her [facility] admission they both came to live with daughter in [place]: Constitutional:  Weight loss.  Stable energy level, no profound weaknss ENT:  No nose bleeds Pulm:  Breathing is better, some clear productive cough.  CV:  No palpitations, no LE edema.  GU:  No hematuria, no frequency GI:  Per HPI Heme:  No unusual bleeding or excessive bruising.     Transfusions:  None ever.   Neuro:  No headaches, no peripheral tingling or numbness Derm:  No itching, no rash or sores.  Endocrine:  No sweats or chills.  No polyuria or dysuria Immunization:  Not queried.  Travel:  None beyond local counties in last few months.    PHYSICAL EXAM: Vital signs in last 24 hours: Filed Vitals:   07/06/15 1052 07/06/15 1113  BP: 133/76 137/102  Pulse: 129 149  Temp:    Resp: 24    Wt Readings from Last 3 Encounters:  07/06/15 60.4 kg (133 lb 2.5 oz)  06/20/15 63.504 kg (140 lb)  06/04/15 63.504 kg (140 lb)   General: pleasant, comfortable, frail.   Head:  No swelling or asymmetry.  Involuntary Head movement.  Eyes:  No icterus, no pallor.   Ears:  Not HOH  Nose:  No congestion or discharge Mouth:   Clear, somewhat dry.  Lip smacking Neck:  No mass Lungs:  Clear bil.  No cough.  Voice raspy and soft Heart: Irreg, irreg, rate not accelerated Abdomen:  Soft, NT, ND.  Active BS.  No succussion splash.   Rectal: deferred   Musc/Skeltl:  Arthritic changes in hands and fingers, spinal kyphosis.   Extremities:  No CCE  Neurologic:  Oriented x 3.  Pill rolling tremor in hands.  Moves all 4 limbs, strength not tested.  Alert.  Skin:  No sores, no rashes.    Psych:  Pleasant, calm.   Intake/Output from previous day: 04/03 0701 - 04/04 0700 In: 500 [I.V.:500] Out: -  Intake/Output this shift:    LAB RESULTS:  Recent Labs  07/05/15 1709  WBC 7.1  HGB 12.5  HCT 35.6*  PLT 268   BMET Lab Results  Component Value Date   NA 139 07/06/2015   NA 138 07/05/2015   NA 136 06/20/2015   K 4.0 07/06/2015   K 3.9 07/05/2015   K 4.1 06/20/2015   CL 107 07/06/2015   CL 105 07/05/2015   CL  103 06/20/2015   CO2 21* 07/06/2015   CO2 20* 07/05/2015   CO2 22 06/20/2015   GLUCOSE 100* 07/06/2015   GLUCOSE 101* 07/05/2015   GLUCOSE 136* 06/20/2015   BUN 31* 07/06/2015   BUN 37* 07/05/2015   BUN 36* 06/20/2015   CREATININE 1.49* 07/06/2015   CREATININE 1.62* 07/05/2015   CREATININE 1.42* 06/20/2015   CALCIUM 9.1 07/06/2015   CALCIUM 10.1 07/05/2015   CALCIUM 9.7 06/20/2015   LFT  Recent Labs  07/05/15 1709  PROT 7.1  ALBUMIN 3.8  AST 24  ALT 24  ALKPHOS 63  BILITOT 0.9  BILIDIR 0.2  IBILI 0.7   PT/INR Lab Results  Component Value Date   INR 1.20 01/29/2015   INR 1.11 01/20/2015   INR 1.12 01/17/2014   Hepatitis Panel No results for input(s): HEPBSAG, HCVAB, HEPAIGM, HEPBIGM in the last 72 hours. C-Diff No components found for: CDIFF Lipase     Component Value Date/Time   LIPASE 21* 01/10/2015 0131    Drugs of Abuse  No results found for: LABOPIA, COCAINSCRNUR, LABBENZ, AMPHETMU, THCU, LABBARB   RADIOLOGY STUDIES: Ct Abdomen Pelvis Wo Contrast  07/05/2015   CLINICAL DATA:  Bloating, difficulty swallowing, history small-bowel obstruction, ventral hernia, GERD, stomach ulcers, hypertension, type II diabetes mellitus, COPD, asthma EXAM: CT ABDOMEN AND PELVIS WITHOUT CONTRAST TECHNIQUE: Multidetector CT imaging of the abdomen and pelvis was performed following the standard protocol without IV contrast. Sagittal and coronal MPR images reconstructed from axial data set. Patient drank dilute oral contrast for exam. COMPARISON:  01/10/2015 FINDINGS: Lung bases clear. Multiple low-attenuation hepatic foci likely cysts, unchanged. Gallbladder surgically absent. Remainder of liver, spleen, pancreas, kidneys, and adrenal glands normal. Extensive atherosclerotic calcifications. Questionable mild wall thickening of distal esophagus. Increased thickening of the gastric wall versus the previous exam, nonspecific. Small ventral hernia containing nonobstructed bowel loop. Scattered colonic diverticulosis without evidence of diverticulitis or bowel obstruction. Bladder, ureters, uterus and adnexa normal appearance. Appendix not visualized. No mass, adenopathy, free air or free fluid. Bones demineralized. IMPRESSION: No definite acute intra abdominal abnormalities. Questionable mild wall thickening of the gastric wall versus artifact from underdistention ; consider followup endoscopy or upper GI assessment if patient has persistent symptoms. Electronically Signed   By: Lavonia Dana M.D.   On: 07/05/2015 19:53   Dg Chest 1 View  07/05/2015  CLINICAL DATA:  Dysphagia EXAM: CHEST 1 VIEW COMPARISON:  06/20/2015 FINDINGS: There is unchanged mild cardiomegaly. The lungs are clear. No large effusions. Normal pulmonary vasculature. Unremarkable hilar and mediastinal contours, unchanged. IMPRESSION: Cardiomegaly, unchanged.  No acute cardiopulmonary findings. Electronically Signed   By: Andreas Newport M.D.   On: 07/05/2015 22:29   Dg Chest Port 1 View  07/06/2015  CLINICAL DATA:  Dyspnea  EXAM: PORTABLE CHEST 1 VIEW COMPARISON:  07/05/2015 FINDINGS: Cardiomediastinal silhouette is stable. No segmental infiltrate or pleural effusion. Central mild vascular congestion without convincing pulmonary edema. Elevation of the right hemidiaphragm again noted. IMPRESSION: No segmental infiltrate or pleural effusion. Central mild vascular congestion without convincing pulmonary edema. Elevation of the right hemidiaphragm again noted. Electronically Signed   By: Lahoma Crocker M.D.   On: 07/06/2015 09:58    ENDOSCOPIC STUDIES: Per HPI  IMPRESSION:   *  Dysphagia in pt with hx of gastrojejunostomy, PUD and anastomotic stricture, GOO, gastric bezoars.  Known small paraesophageal hernia.  Suspect esophageal dysmotility.  No hx of esophageal strictures.    *  New onset a fib/flutter, Cardizem drip to start.  She has not been started on heparin, Lovenox...  *  Respiratory distress.     PLAN:     *  EGD, possible esophageal dilatation, when stable.  Set tentatively for tomorrow AM.  Discussed with pt and her dtr.   *  Keep NPO for time being. On IV PPI.     Azucena Freed  07/06/2015, 11:15 AM Pager: [phone]

## 2015-07-06 NOTE — Consult Note (Signed)
Name: Shannon [name] MRN: [id number] DOB: July 28, 1923    ADMISSION DATE:  07/05/2015 CONSULTATION DATE:  4/4  REFERRING MD : [facility]  CHIEF COMPLAINT:  Chest pain  BRIEF PATIENT DESCRIPTION:  82 yo aaf in distress  SIGNIFICANT EVENTS  4/4 developed chest pain  STUDIES:     HISTORY OF PRESENT ILLNESS:   82 yo AAF with a history of asthma, dysphagia, chest pain since 3/19, former DNR per records, who was admitted 4/3 and was taken to radiology 4/4 for barium swallow which was canceled per GI and was for EGD later on 4/4. She then developed increased wob, chest pain , a fib with rvr 160s. Placed on bipap but this drove  her PH 7.61 and PCO2 to 11. Pccm consulted and LCB status established. Cardiology was also consulted for chest pain and RVR to her A fib. She will be moved to SDU per [facility] service and PCCM will consult. Again she is a limited code blue with no intubation, cpr, shock, NIMVS is allowed but she is refractory to NIMVS at this time.  PAST MEDICAL HISTORY :   has a past medical history of Pneumonia; Hyperlipidemia; Hypertension; Small bowel obstruction (HCC); Ventral hernia with bowel obstruction; Asthma; Hyperlipidemia; Stroke [facility]); COPD (chronic obstructive pulmonary disease) (HCC); Hypothyroidism; Type II diabetes mellitus (HCC); Anemia; History of blood transfusion; GERD (gastroesophageal reflux disease); History of stomach ulcers; Arthritis; Renal insufficiency; Depression; and SIADH (syndrome of inappropriate ADH production) (HCC).  has past surgical history that includes Tubal ligation; Colectomy; Hernia repair (2015); Esophagogastroduodenoscopy (N/A, 01/19/2014); Esophagogastroduodenoscopy (N/A, 01/20/2014); Esophagogastroduodenoscopy (N/A, 03/19/2014); Cholecystectomy open; Ventral hernia repair (2015); Cataract extraction w/ intraocular lens  implant, bilateral; Gastrojejunostomy; laparotomy (N/A, 01/20/2015); and Lysis of adhesion (N/A, 01/20/2015). Prior to Admission  medications   Medication Sig Start Date End Date Taking? Authorizing Provider  acetaminophen (TYLENOL) 325 MG tablet Take 325 mg by mouth every 6 (six) hours as needed for mild pain or fever.    Yes Historical Provider, MD  ADVAIR DISKUS 250-50 MCG/DOSE AEPB INHALE 1 PUFF INTO THE LUNGS 2 (TWO) TIMES DAILY AS NEEDED FOR SHORTNESS OF BREATH 12/01/14  Yes Historical Provider, MD  albuterol (PROVENTIL HFA;VENTOLIN HFA) 108 (90 Base) MCG/ACT inhaler Inhale 2 puffs into the lungs every 6 (six) hours as needed for wheezing or shortness of breath. 06/04/15  Yes Monica Carter, DO  amLODipine (NORVASC) 10 MG tablet TAKE 1 TABLET BY MOUTH DAILY FOR HYPERTENSION 03/15/15  Yes Kirt Boys, DO  aspirin EC 81 MG tablet Take 81 mg by mouth every morning.   Yes Historical Provider, MD  atorvastatin (LIPITOR) 10 MG tablet TAKE 1 TABLET BY MOUTH EVERY DAY Patient taking differently: TAKE 1 TABLET BY MOUTH EVERY DAY IN MORNING 04/08/15  Yes Kirt Boys, DO  b complex vitamins tablet Take 1 tablet by mouth daily.    Yes Historical Provider, MD  calcium-vitamin D (OSCAL WITH D) 500-200 MG-UNIT per tablet Take 1 tablet by mouth daily with breakfast.    Yes Historical Provider, MD  chlorhexidine (PERIDEX) 0.12 % solution 15 mLs by Mouth Rinse route 2 (two) times daily. Patient taking differently: 15 mLs by Mouth Rinse route every evening.  03/26/14  Yes Alison Murray, MD  cloNIDine (CATAPRES) 0.1 MG tablet Take 1 tablet (0.1 mg total) by mouth 2 (two) times daily. 07/13/14  Yes Lars Masson, MD  [employer] GAS RELIEF 80 MG chewable tablet CHEW 1 TABLET BY MOUTH 4 TIMES A DAY AS NEEDED FOR FLATULENCE 06/28/15  Yes Kirt Boys, DO  diclofenac sodium (VOLTAREN) 1 % GEL Apply 2 g topically daily as needed (for pain).    Yes Historical Provider, MD  feeding supplement (BOOST / RESOURCE BREEZE) LIQD Take 1 Container by mouth 3 (three) times daily between meals. 01/27/15  Yes Shanker Levora Dredge, MD  fluticasone (FLONASE) 50 MCG/ACT  nasal spray Place 2 sprays into both nostrils daily. 08/07/14  Yes Kirt Boys, DO  gabapentin (NEURONTIN) 300 MG capsule TAKE 1 CAPSULE (300 MG TOTAL) BY MOUTH 3 (THREE) TIMES DAILY. 05/31/15  Yes Kirt Boys, DO  guaiFENesin (MUCINEX) 600 MG 12 hr tablet Take 1 tablet (600 mg total) by mouth 2 (two) times daily. Patient taking differently: Take 600 mg by mouth 2 (two) times daily as needed for cough or to loosen phlegm.  06/20/15  Yes Linwood Dibbles, MD  hydrALAZINE (APRESOLINE) 100 MG tablet TAKE 1 TABLET (100 MG TOTAL) BY MOUTH 3 (THREE) TIMES DAILY. 05/31/15  Yes Kirt Boys, DO  isosorbide mononitrate (IMDUR) 30 MG 24 hr tablet Take one tablet by mouth once daily. If Chest Pains occur increase to two tablets daily. Patient taking differently: Take 30 mg by mouth daily.  07/22/14  Yes Monica Carter, DO  lactulose (CHRONULAC) 10 GM/15ML solution Take 15 mLs (10 g total) by mouth daily as needed for mild constipation. 06/02/14  Yes Mahima Pandey, MD  latanoprost (XALATAN) 0.005 % ophthalmic solution Place 1 drop into both eyes at bedtime. 06/02/14  Yes Mahima Glade Lloyd, MD  levothyroxine (SYNTHROID, LEVOTHROID) 200 MCG tablet Take one tablet by mouth once daily 30 minutes before breakfast for thyroid Patient taking differently: Take 200 mcg by mouth daily before breakfast. Take one tablet by mouth once daily 30 minutes before breakfast for thyroid 07/05/15  Yes Kirt Boys, DO  metoprolol succinate (TOPROL-XL) 25 MG 24 hr tablet TAKE 1 TABLET (25 MG TOTAL) BY MOUTH DAILY. 06/17/15  Yes Sharon Seller, NP  Multiple Vitamins-Minerals (MULTIVITAMIN WITH MINERALS) tablet Take 1 tablet by mouth every morning.    Yes Historical Provider, MD  pantoprazole (PROTONIX) 40 MG tablet TAKE 1 TABLET BY MOUTH TWICE A DAY 08/07/14  Yes Kirt Boys, DO  Polyethyl Glycol-Propyl Glycol 0.4-0.3 % SOLN Place 1 drop into both eyes 4 (four) times daily.    Yes Historical Provider, MD  polyethylene glycol powder (GLYCOLAX/MIRALAX)  powder Take 17 g by mouth 2 (two) times daily as needed. Patient taking differently: Take 17 g by mouth 2 (two) times daily.  03/12/15  Yes Monica Carter, DO  RESTASIS 0.05 % ophthalmic emulsion USE 1 DROP INTO BOTH EYES TWICE DAILY 10/19/14  Yes Kirt Boys, DO  sennosides-docusate sodium (SENOKOT-S) 8.6-50 MG tablet Take 1 tablet by mouth 2 (two) times daily.    Yes Historical Provider, MD  sertraline (ZOLOFT) 100 MG tablet TAKE 1 TABLET BY MOUTH ONCE DAILY FOR DEPRESSION 07/05/15  Yes Kirt Boys, DO  sodium chloride 1 g tablet TAKE 1 TABLET BY MOUTH THREE TIMES DAILY 05/19/15  Yes Kirt Boys, DO  traMADol (ULTRAM) 50 MG tablet Take 50 mg by mouth 2 (two) times daily as needed for moderate pain.    Yes Historical Provider, MD   No Known Allergies  FAMILY HISTORY:  family history includes Diabetes in her brother; Hypertension in her mother; Stroke in her mother. There is no history of Heart attack. SOCIAL HISTORY:  reports that she has never smoked. She has quit using smokeless tobacco. Her smokeless tobacco use included Snuff. She reports that  she does not drink alcohol or use illicit drugs.  REVIEW OF SYSTEMS:   10 point review of system taken, please see HPI for positives and negatives.   SUBJECTIVE:  Increase resp rate  VITAL SIGNS: Temp:  [97.5 F (36.4 C)-98 F (36.7 C)] 97.5 F (36.4 C) (04/04 0610) Pulse Rate:  [49-147] 129 (04/04 1052) Resp:  [12-39] 24 (04/04 1052) BP: (116-199)/(48-103) 133/76 mmHg (04/04 1052) SpO2:  [94 %-100 %] 100 % (04/04 1052) Weight:  [133 lb 2.5 oz (60.4 kg)] 133 lb 2.5 oz (60.4 kg) (04/04 0610)  PHYSICAL EXAMINATION: General:  [id number] Neuro: Alert and follows commands HEENT: PERL 4mm, no JVD/LAN Cardiovascular:  HSIR IR 124 Lungs:  Exp wheeze, fine basilar crackles Abdomen:  Soft + bs Musculoskeletal: intact Skin:  Warm and moist   Recent Labs Lab 07/05/15 1709 07/06/15 0411  NA 138 139  K 3.9 4.0  CL 105 107  CO2 20* 21*    BUN 37* 31*  CREATININE 1.62* 1.49*  GLUCOSE 101* 100*    Recent Labs Lab 07/05/15 1709  HGB 12.5  HCT 35.6*  WBC 7.1  PLT 268   Ct Abdomen Pelvis Wo Contrast  07/05/2015  CLINICAL DATA:  Bloating, difficulty swallowing, history small-bowel obstruction, ventral hernia, GERD, stomach ulcers, hypertension, type II diabetes mellitus, COPD, asthma EXAM: CT ABDOMEN AND PELVIS WITHOUT CONTRAST TECHNIQUE: Multidetector CT imaging of the abdomen and pelvis was performed following the standard protocol without IV contrast. Sagittal and coronal MPR images reconstructed from axial data set. Patient drank dilute oral contrast for exam. COMPARISON:  01/10/2015 FINDINGS: Lung bases clear. Multiple low-attenuation hepatic foci likely cysts, unchanged. Gallbladder surgically absent. Remainder of liver, spleen, pancreas, kidneys, and adrenal glands normal. Extensive atherosclerotic calcifications. Questionable mild wall thickening of distal esophagus. Increased thickening of the gastric wall versus the previous exam, nonspecific. Small ventral hernia containing nonobstructed bowel loop. Scattered colonic diverticulosis without evidence of diverticulitis or bowel obstruction. Bladder, ureters, uterus and adnexa normal appearance. Appendix not visualized. No mass, adenopathy, free air or free fluid. Bones demineralized. IMPRESSION: No definite acute intra abdominal abnormalities. Questionable mild wall thickening of the gastric wall versus artifact from underdistention ; consider followup endoscopy or upper GI assessment if patient has persistent symptoms. Electronically Signed   By: Ulyses Southward M.D.   On: 07/05/2015 19:53   Dg Chest 1 View  07/05/2015  CLINICAL DATA:  Dysphagia EXAM: CHEST 1 VIEW COMPARISON:  06/20/2015 FINDINGS: There is unchanged mild cardiomegaly. The lungs are clear. No large effusions. Normal pulmonary vasculature. Unremarkable hilar and mediastinal contours, unchanged. IMPRESSION: Cardiomegaly,  unchanged.  No acute cardiopulmonary findings. Electronically Signed   By: Ellery Plunk M.D.   On: 07/05/2015 22:29   Dg Chest Port 1 View  07/06/2015  CLINICAL DATA:  Dyspnea EXAM: PORTABLE CHEST 1 VIEW COMPARISON:  07/05/2015 FINDINGS: Cardiomediastinal silhouette is stable. No segmental infiltrate or pleural effusion. Central mild vascular congestion without convincing pulmonary edema. Elevation of the right hemidiaphragm again noted. IMPRESSION: No segmental infiltrate or pleural effusion. Central mild vascular congestion without convincing pulmonary edema. Elevation of the right hemidiaphragm again noted. Electronically Signed   By: Natasha Mead M.D.   On: 07/06/2015 09:58    ASSESSMENT       Chest pain, ? Related to GI process vs angina    Respiratory distress   A-fib (HCC), new   CVA (cerebral vascular accident) (HCC)   CKD (chronic kidney disease) stage 3, GFR 30-59 ml/min   Dysphagia, chronic  Discussion: 82 yo AAF with a history of asthma, dysphagia, chest pain since 3/19, former DNR per records, who was admitted 4/3 and was taken to radiology 4/4 for barium swallow which was canceled per GI and was for EGD later on 4/4. She then developed increased wob, chest pain , a fib with rvr 160s. Placed on bipap but this drove  her PH 7.61 and PCO2 to 11. Pccm consulted and LCB status established. Cardiology was also consulted for chest pain and RVR to her A fib. She will be moved to SDU per [facility] service and PCCM will consult. Again she is a limited code blue with no intubation, cpr, shock, NIMVS is allowed but she is refractory to NIMVS at this time.   PLAN: Move to SDU O2 as needed BD's as needed IV steroids, wean to off quickly in absence active wheeze Consider BiPAP, but made her WOB and HR worse during 4/4 episode  Dilt drip per cards Continue BB  GI to revaluate for EGD  LCB confirmed  Brett Canales Minor ACNP Adolph Pollack PCCM Pager [phone] till 3 pm If no answer page  [phone] 07/06/2015, 11:34 AM   Attending Note:  I have examined patient, reviewed labs, studies and notes. I have discussed the case with S Minor, and I agree with the data and plans as amended above. 89 yof with CAD, asthma, dysphagia, hx SBO. Admitted with acute dysphagia / choking episode. Developed acute dyspnea, chest discomfort 4/4, was placed on BiPAp briefly but did not tolerate and became alkalotic. She went into rapid A fib while on BiPAP, currently being evaluated by Cardiology. On my eval she is dyspneic, is using accessory muscles to breathe, has no wheeze. She has some mid sternal pain. Her CXR is clear. This does not appear to be an exacerbation asthma. I suspect dyspnea due to her abd process, restriction compounded by subsequent A Fib + RVR. I spoke with the patient's daughter at bedside. Discussed possible interventions including MV, CPR should her mother decompensate. I have recommended against these maneuvers because I do not believe the patient would survive them. Her daughter agrees. She would like full medical care but no CPR, defib, MV. Pt will be moved to SDU, started on dilt gtt. GI eval can unfold once she is stabilized from this episode. Independent critical care time is 45 minutes.   Levy Pupa, MD, PhD 07/06/2015, 12:52 PM [facility] [phone] or if no answer [phone]

## 2015-07-06 NOTE — Significant Event (Signed)
Rapid Response Event Note  Overview:  Called to assist with patient with resp distress Time Called: 0936 Arrival Time: [id number] Event Type: Respiratory  Initial Focused Assessment:  On arrival Dr. Kathe Mariner and RN Sarah at bedside.  Patient alert - can speak broken sentences - skin warm and dry - using accessory muscles to breathe - denies CP - states "i just can't breathe."  Bil BS present - not moving much air - RR 38 O2 sats 100% on 2 liter nasal cannula - per report she has hx of COPD and some CHF - HR 81 and regular on monitor - no JVD noted - no overt rales - RN Judson Roch reports patient just came back from aborted barium swallow in radiology due to need for EGD today and barium contraindicated on same day.  Abd round soft - tender - some slight pedal edema.  PCXR just done. Monitor shows SR.  BP 178/87 HR 87 RR 36-38 O2 sats 100%.  Patient states she has this issue at home and the breathing medicine unusually makes her better.     Interventions:  Stat Duoneb given per order.  Stat solumedrol IV given per RN Sarah per order.  Patient denies much relief from neb.  PCXR report states mild vascular congestion without overt edema.  Patient coughing up some frothy white sputum. Continues with rapid resp rate - stat ABG drawn.  Now beginning to c/o some chest pressure - increasing anxiety - and some left chest pain - lateral to left breast.  Stat 12 lead EKG done. 1024: BP 168/68 HR 105 - One SL NTG tablet given per CP protocol.  12 lead without acute changes - ST.  Dr. Frederic Jericho to room - states NTG helped slightly - 1025:  40 mg Lasix IV per MD order.  1026:  2.5 mg Lopressor IV given per MD order.  1031:  132/72 125 - continue to c/o CP 8/10 denies nausea - NTG one SL given.  1038:  Morphine 1 mg IV given. Attempted Bipap per MD order - patient did not tolerate - back to nasal cannula.   Diaphoresis - 10/10 CP with heart rate now 150 - stat 12 lead shows aflutter/fib.  1042:  126/93 147 RR 28 O2 sats 100% on 2  liter n/c.  2.5 mg Lopressor IV given. Dr. Tyrell Antonio remains at bedside - cardiology and PCCM consults called. 2nd IV #22 angio to right forearm site unremarkable - to NSL.  Richardson Landry Minor NP and Dr. Lamonte Sakai to bedside.  Rosaria Ferries NP to bedside.  HR fluctuates 130 - 160.  1052:  133/76 HR 151.  1109:  Cardizem 10 mg IV bolus given.  1113:  137/102 HR 149.  1115:  Cardizem 1:1 drip started at 5 mg/hr.  1122:  126/65 HR 103.   Patient now with some pain relief - states pain is now 5/5 - resps rate 22 with significant decrease in WOB.  1132:  Cardizem increased to 10mg /hr per Rosaria Ferries NP.  [id number]:  BP 128/62 HR 100 RR 20 O2 sats 100% on 2 liter nasal cannula.  Patient relaxed and speaking with daughter.  CP almost gone per patient.  Handoff to Sarah RN and Deloria Lair RN - patient for transfer to 3W SDU - to call as needed.     Event Summary: Name of Physician Notified: Dr. Tyrell Antonio at  (pta RRT)  Name of Consulting Physician Notified: Dr. Meda Coffee - Cardiology       Dr. Lamonte Sakai - PCCM  at  (per Dr. Kathe Mariner)  Outcome: Transferred (Comment)  Event End Time: [id number] (3W SDU)  Quin Hoop

## 2015-07-06 NOTE — Progress Notes (Addendum)
CRITICAL VALUE ALERT  Critical value received:  Troponin 0.77  Date of notification:  07/06/2015  Time of notification:  2215  Critical value read back: yes  Nurse who received alert:  Kerrie Buffalo  MD notified (1st page):  Tylene Fantasia  Time of first page:  2245  MD notified (2nd page):   Time of second page:  Responding MD:  Tylene Fantasia  Time MD responded:  2330; orders received to obtain EKG.

## 2015-07-07 ENCOUNTER — Encounter (HOSPITAL_COMMUNITY)
Admission: EM | Disposition: A | Discharge status: Home / Self Care | Payer: Self-pay | Source: Home / Self Care | Attending: Internal Medicine

## 2015-07-07 ENCOUNTER — Inpatient Hospital Stay (HOSPITAL_COMMUNITY): Payer: Medicare Other | Admitting: Anesthesiology

## 2015-07-07 ENCOUNTER — Inpatient Hospital Stay (HOSPITAL_COMMUNITY): Payer: Medicare Other

## 2015-07-07 DIAGNOSIS — N184 Chronic kidney disease, stage 4 (severe): Secondary | ICD-10-CM | POA: Insufficient documentation

## 2015-07-07 DIAGNOSIS — R7989 Other specified abnormal findings of blood chemistry: Secondary | ICD-10-CM

## 2015-07-07 DIAGNOSIS — Z79899 Other long term (current) drug therapy: Secondary | ICD-10-CM

## 2015-07-07 DIAGNOSIS — R0602 Shortness of breath: Secondary | ICD-10-CM

## 2015-07-07 DIAGNOSIS — N179 Acute kidney failure, unspecified: Secondary | ICD-10-CM

## 2015-07-07 DIAGNOSIS — N183 Chronic kidney disease, stage 3 (moderate): Secondary | ICD-10-CM

## 2015-07-07 LAB — BASIC METABOLIC PANEL
Anion gap: 18 — ABNORMAL HIGH (ref 5–15)
BUN: 38 mg/dL — ABNORMAL HIGH (ref 6–20)
CO2: 16 mmol/L — ABNORMAL LOW (ref 22–32)
Calcium: 9.1 mg/dL (ref 8.9–10.3)
Chloride: 106 mmol/L (ref 101–111)
Creatinine, Ser: 1.86 mg/dL — ABNORMAL HIGH (ref 0.44–1.00)
GFR calc Af Amer: 26 mL/min — ABNORMAL LOW (ref >60)
GFR calc non Af Amer: 23 mL/min — ABNORMAL LOW (ref >60)
Glucose, Bld: 227 mg/dL — ABNORMAL HIGH (ref 65–99)
Potassium: 4.2 mmol/L (ref 3.5–5.1)
Sodium: 140 mmol/L (ref 135–145)

## 2015-07-07 LAB — GLUCOSE, CAPILLARY
Glucose-Capillary: 212 mg/dL — ABNORMAL HIGH (ref 65–99)
Glucose-Capillary: 199 mg/dL — ABNORMAL HIGH (ref 65–99)
Glucose-Capillary: 271 mg/dL — ABNORMAL HIGH (ref 65–99)
Glucose-Capillary: 336 mg/dL — ABNORMAL HIGH (ref 65–99)

## 2015-07-07 LAB — HEPARIN LEVEL (UNFRACTIONATED): Heparin Unfractionated: 0.3 IU/mL (ref 0.30–0.70)

## 2015-07-07 LAB — CBC
HCT: 35.4 % — ABNORMAL LOW (ref 36.0–46.0)
Hemoglobin: 12.5 g/dL (ref 12.0–15.0)
MCH: 32 pg (ref 26.0–34.0)
MCHC: 35.3 g/dL (ref 30.0–36.0)
MCV: 90.5 fL (ref 78.0–100.0)
Platelets: 248 10*3/uL (ref 150–400)
RBC: 3.91 MIL/uL (ref 3.87–5.11)
RDW: 17 % — ABNORMAL HIGH (ref 11.5–15.5)
WBC: 7 10*3/uL (ref 4.0–10.5)

## 2015-07-07 LAB — T4, FREE: Free T4: 2 ng/dL — ABNORMAL HIGH (ref 0.61–1.12)

## 2015-07-07 LAB — TROPONIN I
Troponin I: 0.4 ng/mL — ABNORMAL HIGH (ref <0.031)
Troponin I: 0.41 ng/mL — ABNORMAL HIGH (ref <0.031)
Troponin I: 0.31 ng/mL — ABNORMAL HIGH (ref <0.031)

## 2015-07-07 SURGERY — EGD (ESOPHAGOGASTRODUODENOSCOPY)
Anesthesia: Monitor Anesthesia Care

## 2015-07-07 MED ORDER — HEPARIN (PORCINE) IN NACL 100-0.45 UNIT/ML-% IJ SOLN
700.0000 [IU]/h | INTRAMUSCULAR | Status: DC
  Administered 2015-07-07: 650 [IU]/h via INTRAVENOUS
  Filled 2015-07-07: qty 250

## 2015-07-07 MED ORDER — INSULIN ASPART 100 UNIT/ML ~~LOC~~ SOLN
0.0000 [IU] | Freq: Every day | SUBCUTANEOUS | Status: DC
  Administered 2015-07-07: 4 [IU] via SUBCUTANEOUS
  Administered 2015-07-08: 3 [IU] via SUBCUTANEOUS

## 2015-07-07 MED ORDER — METHYLPREDNISOLONE SODIUM SUCC 125 MG IJ SOLR
80.0000 mg | Freq: Four times a day (QID) | INTRAMUSCULAR | Status: DC
  Administered 2015-07-07 – 2015-07-08 (×4): 80 mg via INTRAVENOUS
  Filled 2015-07-07 (×4): qty 2

## 2015-07-07 MED ORDER — HEPARIN BOLUS VIA INFUSION
3000.0000 [IU] | Freq: Once | INTRAVENOUS | Status: AC
  Administered 2015-07-07: 3000 [IU] via INTRAVENOUS
  Filled 2015-07-07: qty 3000

## 2015-07-07 MED ORDER — IPRATROPIUM-ALBUTEROL 0.5-2.5 (3) MG/3ML IN SOLN
3.0000 mL | Freq: Two times a day (BID) | RESPIRATORY_TRACT | Status: DC
  Administered 2015-07-08 – 2015-07-11 (×6): 3 mL via RESPIRATORY_TRACT
  Filled 2015-07-07 (×7): qty 3

## 2015-07-07 MED ORDER — TECHNETIUM TC 99M DIETHYLENETRIAME-PENTAACETIC ACID: 29.5000 | Freq: Once | INTRAVENOUS | Status: DC | PRN

## 2015-07-07 MED ORDER — PANTOPRAZOLE SODIUM 40 MG PO TBEC
40.0000 mg | DELAYED_RELEASE_TABLET | Freq: Two times a day (BID) | ORAL | Status: DC
  Administered 2015-07-07 – 2015-07-08 (×3): 40 mg via ORAL
  Filled 2015-07-07 (×3): qty 1

## 2015-07-07 MED ORDER — INSULIN ASPART 100 UNIT/ML ~~LOC~~ SOLN
0.0000 [IU] | Freq: Three times a day (TID) | SUBCUTANEOUS | Status: DC
  Administered 2015-07-08: 3 [IU] via SUBCUTANEOUS
  Administered 2015-07-08: 2 [IU] via SUBCUTANEOUS
  Administered 2015-07-08: 5 [IU] via SUBCUTANEOUS
  Administered 2015-07-09: 2 [IU] via SUBCUTANEOUS
  Administered 2015-07-10 – 2015-07-11 (×4): 1 [IU] via SUBCUTANEOUS

## 2015-07-07 MED ORDER — LORAZEPAM 2 MG/ML IJ SOLN
0.5000 mg | Freq: Once | INTRAMUSCULAR | Status: AC
  Administered 2015-07-07: 0.5 mg via INTRAVENOUS
  Filled 2015-07-07: qty 1

## 2015-07-07 MED ORDER — TECHNETIUM TO 99M ALBUMIN AGGREGATED
4.2000 | Freq: Once | INTRAVENOUS | Status: AC | PRN
  Administered 2015-07-07: 4 via INTRAVENOUS

## 2015-07-07 MED ORDER — AMIODARONE HCL 200 MG PO TABS
400.0000 mg | ORAL_TABLET | Freq: Every day | ORAL | Status: DC
  Administered 2015-07-08: 400 mg via ORAL
  Filled 2015-07-07: qty 2

## 2015-07-07 NOTE — Progress Notes (Signed)
RN paged troponin of .77 which is bumped. Cardiology has already seen pt and are following for troponin and Afib. Per cards, elevated troponin likely secondary to demand ischemia from arrhythmia. Pt on Amiodorone gtt at present. Per RN, VSS, HR in the 80s. No obvious acute changes on 12 lead EKG.  Spoke to cardio fellow, Dr. Posey Pronto, about above. Agrees this is likely type II stemi and given pt's age, for medical treatment only.  KJKG, NP [facility]

## 2015-07-07 NOTE — Anesthesia Preprocedure Evaluation (Deleted)
Anesthesia Evaluation  Patient identified by MRN, date of birth, ID band Patient awake    Reviewed: Allergy & Precautions, NPO status , Patient's Chart, lab work & pertinent test results  Airway Mallampati: II  TM Distance: >3 FB Neck ROM: Full    Dental no notable dental hx.    Pulmonary asthma , COPD,    Pulmonary exam normal breath sounds clear to auscultation       Cardiovascular hypertension, Pt. on medications + Cardiac Stents and + Peripheral Vascular Disease  Normal cardiovascular exam Rhythm:Regular Rate:Normal     Neuro/Psych CVA, Residual Symptoms negative psych ROS   GI/Hepatic Neg liver ROS, PUD, GERD  ,  Endo/Other  diabetesHypothyroidism   Renal/GU Renal InsufficiencyRenal disease  negative genitourinary   Musculoskeletal negative musculoskeletal ROS (+)   Abdominal   Peds negative pediatric ROS (+)  Hematology negative hematology ROS (+)   Anesthesia Other Findings   Reproductive/Obstetrics negative OB ROS                            Anesthesia Physical Anesthesia Plan  ASA: IV  Anesthesia Plan: MAC   Post-op Pain Management:    Induction: Intravenous  Airway Management Planned: Nasal Cannula  Additional Equipment:   Intra-op Plan:   Post-operative Plan: Extubation in OR  Informed Consent: I have reviewed the patients History and Physical, chart, labs and discussed the procedure including the risks, benefits and alternatives for the proposed anesthesia with the patient or authorized representative who has indicated his/her understanding and acceptance.   Dental advisory given  Plan Discussed with: CRNA and Surgeon  Anesthesia Plan Comments:         Anesthesia Quick Evaluation

## 2015-07-07 NOTE — Progress Notes (Signed)
Patient Name: Shannon [name] Date of Encounter: 07/07/2015  Primary Cardiologist: Dr. Meda Coffee   82 yo female w/ PMH of Type 2 DM, CKD Stage 3, nonobstructive CAD and HTN admitted 04/03 w/ dysphagia and went into rapid aflutter.    Principal Problem:   Dysphagia Active Problems:   CVA (cerebral vascular accident) ([place])   CKD (chronic kidney disease) stage 3, GFR 30-59 ml/min   Chest pain   Respiratory distress   Atrial flutter with rapid ventricular response (HCC)    SUBJECTIVE  Still SOB, but mildly improved. Still has abdominal discomfort and L sided chest pain  CURRENT MEDS . cycloSPORINE  1 drop Both Eyes BID  . fluticasone  2 spray Each Nare Daily  . ipratropium-albuterol  3 mL Nebulization TID  . latanoprost  1 drop Both Eyes QHS  . methylPREDNISolone (SOLU-MEDROL) injection  40 mg Intravenous Q6H  . metoprolol  2.5 mg Intravenous Q12H  . mometasone-formoterol  2 puff Inhalation BID  . pantoprazole  40 mg Oral BID  . polyvinyl alcohol  1 drop Both Eyes QID   . amiodarone 30 mg/hr (07/07/15 0614)   OBJECTIVE  Filed Vitals:   07/07/15 0343 07/07/15 0400 07/07/15 0757 07/07/15 0817  BP:  167/58  160/70  Pulse:  66  70  Temp: 98.7 F (37.1 C)   98.3 F (36.8 C)  TempSrc: Oral   Oral  Resp:  16  18  Height:      Weight: 123 lb 8 oz (56.019 kg)     SpO2:  100% 100% 100%    Intake/Output Summary (Last 24 hours) at 07/07/15 0943 Last data filed at 07/07/15 0700  Gross per 24 hour  Intake      0 ml  Output    750 ml  Net   -750 ml   Filed Weights   07/06/15 0610 07/07/15 0343  Weight: 133 lb 2.5 oz (60.4 kg) 123 lb 8 oz (56.019 kg)    PHYSICAL EXAM  General: Pleasant, NAD. Neuro: Alert and oriented X 3. Moves all extremities spontaneously. Psych: Normal affect. HEENT:  Normal  Neck: Supple without bruits or JVD. Lungs:  Resp regular and unlabored, CTA. Heart: RRR no s3, s4, or murmurs. Abdomen: Soft, non-tender, non-distended, BS + x 4.  Extremities:  No clubbing, cyanosis or edema. DP/PT/Radials 2+ and equal bilaterally.  Accessory Clinical Findings  CBC  Recent Labs  07/05/15 1709 07/07/15 0540  WBC 7.1 7.0  NEUTROABS 4.7  --   HGB 12.5 12.5  HCT 35.6* 35.4*  MCV 89.4 90.5  PLT 268 [id number]   Basic Metabolic Panel  Recent Labs  07/06/15 0411 07/07/15 0540  NA 139 140  K 4.0 4.2  CL 107 106  CO2 21* 16*  GLUCOSE 100* 227*  BUN 31* 38*  CREATININE 1.49* 1.86*  CALCIUM 9.1 9.1   Liver Function Tests  Recent Labs  07/05/15 1709  AST 24  ALT 24  ALKPHOS 63  BILITOT 0.9  PROT 7.1  ALBUMIN 3.8   Cardiac Enzymes  Recent Labs  07/06/15 0945 07/06/15 1501 07/06/15 2123  TROPONINI 0.04* 0.28* 0.77*   Thyroid Function Tests  Recent Labs  07/06/15 1501  TSH 0.029*    TELE Converted from aflutter to NSR around 3PM yesterday, NSR overnight, some tachycardia this morning.     ECG  No new EKG, EKG last night no acute changes  Echocardiogram 01/14/2015  LV EF: 55% - 60%  ------------------------------------------------------------------- Indications: CAD of native vessels  414.01.  ------------------------------------------------------------------- History: PMH: Chest pain. Atrial fibrillation. Stroke. Risk factors: Hypertension. Dyslipidemia.  ------------------------------------------------------------------- Study Conclusions  - Left ventricle: The cavity size was normal. Systolic function was  normal. The estimated ejection fraction was in the range of 55%  to 60%. Wall motion was normal; there were no regional wall  motion abnormalities. - Aortic valve: There was mild to moderate regurgitation.  Regurgitation pressure half-time: 365 ms. - Left atrium: The atrium was severely dilated. - Right ventricle: The cavity size was normal. Wall thickness was  normal. Systolic function was normal. - Tricuspid valve: There was mild regurgitation. - Pulmonary arteries: PA peak  pressure: 42 mm Hg (S). - Inferior vena cava: The vessel was normal in size. The  respirophasic diameter changes were in the normal range (>= 50%),  consistent with normal central venous pressure.    Radiology/Studies  Ct Abdomen Pelvis Wo Contrast  07/05/2015  CLINICAL DATA:  Bloating, difficulty swallowing, history small-bowel obstruction, ventral hernia, GERD, stomach ulcers, hypertension, type II diabetes mellitus, COPD, asthma EXAM: CT ABDOMEN AND PELVIS WITHOUT CONTRAST TECHNIQUE: Multidetector CT imaging of the abdomen and pelvis was performed following the standard protocol without IV contrast. Sagittal and coronal MPR images reconstructed from axial data set. Patient drank dilute oral contrast for exam. COMPARISON:  01/10/2015 FINDINGS: Lung bases clear. Multiple low-attenuation hepatic foci likely cysts, unchanged. Gallbladder surgically absent. Remainder of liver, spleen, pancreas, kidneys, and adrenal glands normal. Extensive atherosclerotic calcifications. Questionable mild wall thickening of distal esophagus. Increased thickening of the gastric wall versus the previous exam, nonspecific. Small ventral hernia containing nonobstructed bowel loop. Scattered colonic diverticulosis without evidence of diverticulitis or bowel obstruction. Bladder, ureters, uterus and adnexa normal appearance. Appendix not visualized. No mass, adenopathy, free air or free fluid. Bones demineralized. IMPRESSION: No definite acute intra abdominal abnormalities. Questionable mild wall thickening of the gastric wall versus artifact from underdistention ; consider followup endoscopy or upper GI assessment if patient has persistent symptoms. Electronically Signed   By: Lavonia Dana M.D.   On: 07/05/2015 19:53   Dg Chest 1 View  07/05/2015  CLINICAL DATA:  Dysphagia EXAM: CHEST 1 VIEW COMPARISON:  06/20/2015 FINDINGS: There is unchanged mild cardiomegaly. The lungs are clear. No large effusions. Normal pulmonary  vasculature. Unremarkable hilar and mediastinal contours, unchanged. IMPRESSION: Cardiomegaly, unchanged.  No acute cardiopulmonary findings. Electronically Signed   By: Andreas Newport M.D.   On: 07/05/2015 22:29   Dg Chest 2 View  06/20/2015  CLINICAL DATA:  Shortness of breath and cough EXAM: CHEST  2 VIEW COMPARISON:  06/20/2015, 1625 hours FINDINGS: Cardiac shadow is again mildly enlarged. Lungs are well aerated bilaterally. No acute bony abnormality is seen. Stable compression deformity is noted in the lower thoracic spine. IMPRESSION: No acute abnormality noted.  No change from 2 hours previous Electronically Signed   By: Inez Catalina M.D.   On: 06/20/2015 18:42   Dg Chest 2 View  06/20/2015  CLINICAL DATA:  Pt here with cough that started last week, hx of bronchitis last year EXAM: CHEST  2 VIEW COMPARISON:  01/27/2015 FINDINGS: The heart is enlarged. No focal consolidations or pleural effusions. Stable elevation of right hemidiaphragm. Surgical clips are noted in the upper abdomen. IMPRESSION: Stable cardiomegaly. Electronically Signed   By: Nolon Nations M.D.   On: 06/20/2015 17:44   Dg Chest Port 1 View  07/06/2015  CLINICAL DATA:  Dyspnea EXAM: PORTABLE CHEST 1 VIEW COMPARISON:  07/05/2015 FINDINGS: Cardiomediastinal silhouette is stable. No segmental  infiltrate or pleural effusion. Central mild vascular congestion without convincing pulmonary edema. Elevation of the right hemidiaphragm again noted. IMPRESSION: No segmental infiltrate or pleural effusion. Central mild vascular congestion without convincing pulmonary edema. Elevation of the right hemidiaphragm again noted. Electronically Signed   By: Lahoma Crocker M.D.   On: 07/06/2015 09:58    ASSESSMENT AND PLAN  1. Paroxysmal atrial flutter with RVR  - TSH low, check free T4. Converted yesterday.   2. Chest pain with elevated trop  - continue to have L sided chest pain, plan to r/o PE first, given elevated Cr, would avoid CTA of  chest, may consider V/Q scan.   - although last echo was 01/2015, given acute change in clinical appearance, will obtain repeat echo  - if no PE and EF decreased or CP persist, will need ischemia workup.  3. Dysphagia: EGD cancelled.   4. Mild fluid overload in the setting aflutter with RVR, given IV lasix.   - Cr worsened, will hold off on further diuresis  5. Dyspnea: unclear cause, r/o PE  Signed, Woodward Ku Pager: [phone]  The patient was seen, examined and discussed with Almyra Deforest, PA-C and I agree with the above.   82 year old female admitted who was admitted with dysphagia, she developed an acute atrial flutter with RVR with respiratory distress.  She appears fluid overloaded on physical exam with mild respiratory distress. Considering her age and comorbidities and an acute a nd new diagnosis of atrial flutter, we started her on amiodarone drip, and in fact she cardioverted back to SR yesterday, She still appears tachypnic, hyperventilating and undergoing workup for possible PE. Her troponin is mildly elevated and continues to rise. This is with acute on chronic kidney failure, a-flutter with RVR, I would start heparin drip especially since PE is also on differential. Finish amiodarone drip, then switch to amiodarone 400 mg po daily. She has normal LVEF in 01/2015, we will repeat. Her recent TSH was 0.029 with high fT4, her Synthroid dose should be decreased.  Dorothy Spark 07/07/2015

## 2015-07-07 NOTE — Progress Notes (Signed)
Daily Rounding Note  07/07/2015, 8:32 AM  LOS: 2 days   SUBJECTIVE:       C/o pressure in her chest, not SOB.  Hungry.  Feels anxious and worried.  OBJECTIVE:         Vital signs in last 24 hours:    Temp:  [98.2 F (36.8 C)-98.7 F (37.1 C)] 98.3 F (36.8 C) (04/05 0817) Pulse Rate:  [48-151] 70 (04/05 0817) Resp:  [15-24] 18 (04/05 0817) BP: (109-180)/(50-102) 160/70 mmHg (04/05 0817) SpO2:  [85 %-100 %] 100 % (04/05 0817) FiO2 (%):  [28 %] 28 % (04/04 2018) Weight:  [56.019 kg (123 lb 8 oz)] 56.019 kg (123 lb 8 oz) (04/05 0343) Last BM Date: 07/06/15 Filed Weights   07/06/15 0610 07/07/15 0343  Weight: 60.4 kg (133 lb 2.5 oz) 56.019 kg (123 lb 8 oz)   General: thin, a bit anxious but comfortable   Heart: irreg, irreg with rates into low 100s to 1teens.  Chest: clear bil.  No labored breathing or cough Abdomen: soft, NT, active BS  Extremities: no CCE Neuro/Psych:  Anxious, moves all 4s, some tremor in hands  Intake/Output from previous day: 04/04 0701 - 04/05 0700 In: 0  Out: 750 [Urine:750]  Intake/Output this shift:    Lab Results:  Recent Labs  07/05/15 1709 07/07/15 0540  WBC 7.1 7.0  HGB 12.5 12.5  HCT 35.6* 35.4*  PLT 268 248   BMET  Recent Labs  07/05/15 1709 07/06/15 0411 07/07/15 0540  NA 138 139 140  K 3.9 4.0 4.2  CL 105 107 106  CO2 20* 21* 16*  GLUCOSE 101* 100* 227*  BUN 37* 31* 38*  CREATININE 1.62* 1.49* 1.86*  CALCIUM 10.1 9.1 9.1   LFT  Recent Labs  07/05/15 1709  PROT 7.1  ALBUMIN 3.8  AST 24  ALT 24  ALKPHOS 63  BILITOT 0.9  BILIDIR 0.2  IBILI 0.7   PT/INR No results for input(s): LABPROT, INR in the last 72 hours. Hepatitis Panel No results for input(s): HEPBSAG, HCVAB, HEPAIGM, HEPBIGM in the last 72 hours.  Studies/Results: Ct Abdomen Pelvis Wo Contrast  07/05/2015  CLINICAL DATA:  Bloating, difficulty swallowing, history small-bowel  obstruction, ventral hernia, GERD, stomach ulcers, hypertension, type II diabetes mellitus, COPD, asthma EXAM: CT ABDOMEN AND PELVIS WITHOUT CONTRAST TECHNIQUE: Multidetector CT imaging of the abdomen and pelvis was performed following the standard protocol without IV contrast. Sagittal and coronal MPR images reconstructed from axial data set. Patient drank dilute oral contrast for exam. COMPARISON:  01/10/2015 FINDINGS: Lung bases clear. Multiple low-attenuation hepatic foci likely cysts, unchanged. Gallbladder surgically absent. Remainder of liver, spleen, pancreas, kidneys, and adrenal glands normal. Extensive atherosclerotic calcifications. Questionable mild wall thickening of distal esophagus. Increased thickening of the gastric wall versus the previous exam, nonspecific. Small ventral hernia containing nonobstructed bowel loop. Scattered colonic diverticulosis without evidence of diverticulitis or bowel obstruction. Bladder, ureters, uterus and adnexa normal appearance. Appendix not visualized. No mass, adenopathy, free air or free fluid. Bones demineralized. IMPRESSION: No definite acute intra abdominal abnormalities. Questionable mild wall thickening of the gastric wall versus artifact from underdistention ; consider followup endoscopy or upper GI assessment if patient has persistent symptoms. Electronically Signed   By: Lavonia Dana M.D.   On: 07/05/2015 19:53   Dg Chest 1 View  07/05/2015  CLINICAL DATA:  Dysphagia EXAM: CHEST 1 VIEW COMPARISON:  06/20/2015 FINDINGS: There is unchanged mild cardiomegaly.  The lungs are clear. No large effusions. Normal pulmonary vasculature. Unremarkable hilar and mediastinal contours, unchanged. IMPRESSION: Cardiomegaly, unchanged.  No acute cardiopulmonary findings. Electronically Signed   By: Andreas Newport M.D.   On: 07/05/2015 22:29   Dg Chest Port 1 View  07/06/2015  CLINICAL DATA:  Dyspnea EXAM: PORTABLE CHEST 1 VIEW COMPARISON:  07/05/2015 FINDINGS:  Cardiomediastinal silhouette is stable. No segmental infiltrate or pleural effusion. Central mild vascular congestion without convincing pulmonary edema. Elevation of the right hemidiaphragm again noted. IMPRESSION: No segmental infiltrate or pleural effusion. Central mild vascular congestion without convincing pulmonary edema. Elevation of the right hemidiaphragm again noted. Electronically Signed   By: Lahoma Crocker M.D.   On: 07/06/2015 09:58    ASSESMENT:   * Dysphagia in pt with hx of gastrojejunostomy, PUD and anastomotic stricture, GOO, gastric bezoars. Known small paraesophageal hernia. Suspect esophageal dysmotility. No hx of esophageal strictures. NPO  * New onset a fib/flutter, Cardizem drip to start. Rates still into 1teens. She has not been started on heparin, Lovenox... Elevated Troponin and chest pressure, felt to be demand ischemia, type 2 STEMI.     *  Hyperthyroidism.  Note TSH low at 0.029, may be contributing to her tachycardia.  Levothyroxine discontinued yesterday.   * Respiratory distress. Improved.  O2 sats good    PLAN   *  Cancelling EGD for today.  Ok to start D1 diet.     Azucena Freed  07/07/2015, 8:32 AM Pager: [phone]

## 2015-07-07 NOTE — Clinical Documentation Improvement (Signed)
Internal Medicine  Possible Conditions:       Respiratory Failure   Document Acuity - Acute, Chronic, Acute on Chronic  Document Inclusion Of - Hypoxia, Hypercapnia, Combination of Both  Other  Clinically Undetermined  Document any associated diagnoses/conditions. Please update your documentation within the medical record to reflect your response to this query. Thank you.  Supporting Information:(As per notes) "She still appears tachypnic, hyperventilating and undergoing workup for possible PE. " "Patient was started on BIPAP for increase work of breathing."  Please exercise your independent, professional judgment when responding. A specific answer is not anticipated or expected.  Thank You, Alessandra Grout, RN, BSN, CCDS,Clinical Documentation Specialist:  [phone]  [phone]=[employer]- Health Information Management

## 2015-07-07 NOTE — Progress Notes (Signed)
ANTICOAGULATION CONSULT NOTE - Initial Consult  Pharmacy Consult for heparin Indication: chest pain/ACS  No Known Allergies  Patient Measurements: Height: 5\' 6"  (167.6 cm) Weight: 123 lb 8 oz (56.019 kg) IBW/kg (Calculated) : 59.3 Heparin Dosing Weight: 56 kg  Vital Signs: Temp: 98.3 F (36.8 C) (04/05 0817) Temp Source: Oral (04/05 0817) BP: 160/70 mmHg (04/05 0817) Pulse Rate: 70 (04/05 0817)  Labs:  Recent Labs  07/05/15 1709 07/06/15 0411 07/06/15 0945 07/06/15 1501 07/06/15 2123 07/07/15 0540  HGB 12.5  --   --   --   --  12.5  HCT 35.6*  --   --   --   --  35.4*  PLT 268  --   --   --   --  248  CREATININE 1.62* 1.49*  --   --   --  1.86*  TROPONINI <0.03  --  0.04* 0.28* 0.77*  --     Estimated Creatinine Clearance: 17.8 mL/min (by C-G formula based on Cr of 1.86).   Medical History: Past Medical History  Diagnosis Date  . Pneumonia   . Hyperlipidemia   . Hypertension   . Small bowel obstruction ([place])     "I don't know how many" (01/11/2015)  . Ventral hernia with bowel obstruction   . Asthma   . Hyperlipidemia   . Stroke ([place])     "light one"  . COPD (chronic obstructive pulmonary disease) ([place])   . Hypothyroidism   . Type II diabetes mellitus ([place])   . Anemia     previous blood transfusions  . GERD (gastroesophageal reflux disease)   . History of stomach ulcers   . Arthritis     "all over"  . Renal insufficiency   . Depression     "light case"  . SIADH (syndrome of inappropriate ADH production) ([place])     [name] 01/10/2015    Medications:  Prescriptions prior to admission  Medication Sig Dispense Refill Last Dose  . acetaminophen (TYLENOL) 325 MG tablet Take 325 mg by mouth every 6 (six) hours as needed for mild pain or fever.    07/04/2015 at Unknown time  . ADVAIR DISKUS 250-50 MCG/DOSE AEPB INHALE 1 PUFF INTO THE LUNGS 2 (TWO) TIMES DAILY AS NEEDED FOR SHORTNESS OF BREATH  3 07/04/2015 at Unknown time  . albuterol (PROVENTIL HFA;VENTOLIN  HFA) 108 (90 Base) MCG/ACT inhaler Inhale 2 puffs into the lungs every 6 (six) hours as needed for wheezing or shortness of breath. 1 Inhaler 2 not used  . amLODipine (NORVASC) 10 MG tablet TAKE 1 TABLET BY MOUTH DAILY FOR HYPERTENSION 30 tablet 3 07/04/2015 at Unknown time  . aspirin EC 81 MG tablet Take 81 mg by mouth every morning.   07/05/2015 at Unknown time  . atorvastatin (LIPITOR) 10 MG tablet TAKE 1 TABLET BY MOUTH EVERY DAY (Patient taking differently: TAKE 1 TABLET BY MOUTH EVERY DAY IN MORNING) 30 tablet 5 07/04/2015 at Unknown time  . b complex vitamins tablet Take 1 tablet by mouth daily.    07/04/2015 at Unknown time  . calcium-vitamin D (OSCAL WITH D) 500-200 MG-UNIT per tablet Take 1 tablet by mouth daily with breakfast.    07/04/2015 at Unknown time  . chlorhexidine (PERIDEX) 0.12 % solution 15 mLs by Mouth Rinse route 2 (two) times daily. (Patient taking differently: 15 mLs by Mouth Rinse route every evening. ) 120 mL 0 07/04/2015 at Unknown time  . cloNIDine (CATAPRES) 0.1 MG tablet Take 1 tablet (0.1 mg  total) by mouth 2 (two) times daily. 60 tablet 11 07/05/2015 at am  . [employer] GAS RELIEF 80 MG chewable tablet CHEW 1 TABLET BY MOUTH 4 TIMES A DAY AS NEEDED FOR FLATULENCE 30 tablet 0 07/04/2015 at Unknown time  . diclofenac sodium (VOLTAREN) 1 % GEL Apply 2 g topically daily as needed (for pain).    07/05/2015 at Unknown time  . feeding supplement (BOOST / RESOURCE BREEZE) LIQD Take 1 Container by mouth 3 (three) times daily between meals. 90 Container 0 07/05/2015 at Unknown time  . fluticasone (FLONASE) 50 MCG/ACT nasal spray Place 2 sprays into both nostrils daily. 16 g 3 07/05/2015 at Unknown time  . gabapentin (NEURONTIN) 300 MG capsule TAKE 1 CAPSULE (300 MG TOTAL) BY MOUTH 3 (THREE) TIMES DAILY. 90 capsule 3 07/05/2015 at 1 dose  . guaiFENesin (MUCINEX) 600 MG 12 hr tablet Take 1 tablet (600 mg total) by mouth 2 (two) times daily. (Patient taking differently: Take 600 mg by mouth 2 (two) times daily as  needed for cough or to loosen phlegm. ) 14 tablet 0 07/04/2015 at Unknown time  . hydrALAZINE (APRESOLINE) 100 MG tablet TAKE 1 TABLET (100 MG TOTAL) BY MOUTH 3 (THREE) TIMES DAILY. 90 tablet 3 07/05/2015 at 1 dose  . isosorbide mononitrate (IMDUR) 30 MG 24 hr tablet Take one tablet by mouth once daily. If Chest Pains occur increase to two tablets daily. (Patient taking differently: Take 30 mg by mouth daily. ) 90 tablet 3 07/05/2015 at Unknown time  . lactulose (CHRONULAC) 10 GM/15ML solution Take 15 mLs (10 g total) by mouth daily as needed for mild constipation. 240 mL 0 Past Month at Unknown time  . latanoprost (XALATAN) 0.005 % ophthalmic solution Place 1 drop into both eyes at bedtime. 2.5 mL 12 07/04/2015 at Unknown time  . levothyroxine (SYNTHROID, LEVOTHROID) 200 MCG tablet Take one tablet by mouth once daily 30 minutes before breakfast for thyroid (Patient taking differently: Take 200 mcg by mouth daily before breakfast. Take one tablet by mouth once daily 30 minutes before breakfast for thyroid) 30 tablet 3 07/05/2015 at Unknown time  . metoprolol succinate (TOPROL-XL) 25 MG 24 hr tablet TAKE 1 TABLET (25 MG TOTAL) BY MOUTH DAILY. 30 tablet 3 07/05/2015 at 0900  . Multiple Vitamins-Minerals (MULTIVITAMIN WITH MINERALS) tablet Take 1 tablet by mouth every morning.    07/04/2015 at Unknown time  . pantoprazole (PROTONIX) 40 MG tablet TAKE 1 TABLET BY MOUTH TWICE A DAY 60 tablet 11 07/05/2015 at am  . Polyethyl Glycol-Propyl Glycol 0.4-0.3 % SOLN Place 1 drop into both eyes 4 (four) times daily.    07/05/2015 at Unknown time  . polyethylene glycol powder (GLYCOLAX/MIRALAX) powder Take 17 g by mouth 2 (two) times daily as needed. (Patient taking differently: Take 17 g by mouth 2 (two) times daily. ) 3350 g 1 07/05/2015 at Unknown time  . RESTASIS 0.05 % ophthalmic emulsion USE 1 DROP INTO BOTH EYES TWICE DAILY 60 mL 1 07/05/2015 at am  . sennosides-docusate sodium (SENOKOT-S) 8.6-50 MG tablet Take 1 tablet by mouth 2  (two) times daily.    07/04/2015 at am  . sertraline (ZOLOFT) 100 MG tablet TAKE 1 TABLET BY MOUTH ONCE DAILY FOR DEPRESSION 30 tablet 5 07/05/2015 at Unknown time  . sodium chloride 1 g tablet TAKE 1 TABLET BY MOUTH THREE TIMES DAILY 90 tablet 2 07/05/2015 at 1 dose  . traMADol (ULTRAM) 50 MG tablet Take 50 mg by mouth 2 (two) times  daily as needed for moderate pain.    Past Month at Unknown time   Scheduled:  . [START ON 07/08/2015] amiodarone  400 mg Oral Daily  . cycloSPORINE  1 drop Both Eyes BID  . fluticasone  2 spray Each Nare Daily  . heparin  3,000 Units Intravenous Once  . ipratropium-albuterol  3 mL Nebulization TID  . latanoprost  1 drop Both Eyes QHS  . methylPREDNISolone (SOLU-MEDROL) injection  40 mg Intravenous Q6H  . metoprolol  2.5 mg Intravenous Q12H  . mometasone-formoterol  2 puff Inhalation BID  . pantoprazole  40 mg Oral BID  . polyvinyl alcohol  1 drop Both Eyes QID    Assessment: 89 yoF, pharmacy consulted to dose heparin for ACS. Not on anticoagulation PTA.   Goal of Therapy:  Heparin level 0.3-0.7 units/ml Monitor platelets by anticoagulation protocol: Yes   Plan:  Give 3000 units bolus x 1 Start heparin infusion at 650 units/hr Check anti-Xa level in 8 hours and daily while on heparin Continue to monitor H&H and platelets   Thank you for allowing [place] to participate in this patients care. Jens Som, PharmD Pager: [phone] 07/07/2015,12:28 PM

## 2015-07-07 NOTE — Progress Notes (Signed)
VASCULAR LAB PRELIMINARY  PRELIMINARY  PRELIMINARY  PRELIMINARY  Bilateral lower extremity venous duplex completed.    Preliminary report:  Bilateral:  No evidence of DVT, superficial thrombosis, or Baker's Cyst.    Ishaaq Penna, RVS 07/07/2015, 2:39 PM

## 2015-07-07 NOTE — Progress Notes (Signed)
TRH Progress Note                                                                                                                                                                                                                      Patient Demographics:    Shannon [name], is a 82 y.o. female, DOB - 1924-03-12, AOZ:[id number]  Admit date - 07/05/2015   Admitting Physician Shannon Bow, DO  Outpatient Primary MD for the patient is Shannon Boys, DO  LOS - 2  Outpatient Specialists:  Chief Complaint  Patient presents with  . Dysphagia        Subjective:    Shannon [name] today has, No headache, No chest pain, No abdominal pain - No Nausea, No new weakness tingling or numbness, No Cough - ++ SOB.    Assessment  & Plan :     1. Acute hypoxic respiratory failure. Etiology not entirely clear at all - question CAD related, VQ scan unremarkable, noncontrast CT chest unremarkable, no evidence of frank fluid overload or infiltrate. Seen by pulmonary. Cardiology following. For now continue supportive care with oxygen and nebulizer treatments, trial of low-dose steroids as she had an episode of bronchitis with similar presentation in the past improved with steroids. Discussed with daughter bedside.  2. A. fib RVR. Paroxysmal. [place] vasc 2 score of greater than 3. Cardiology following, currently in sinus, on amiodarone drip and heparin drip. Echo pending. TSH was low. Hold Synthroid. Further management per cardiology.  3. NSTEMI. Likely due to demand ischemia from #1 and 2 above, on heparin drip, cardiology on board, defer further management to cardiology. Chest pain-free at this time.   4. Iatrogenic hyperthyroidism. We will hold Synthroid, will resume Synthroid in 3-4 days at a much lower dose.  5. Dysphagia with GERD. On PPI. GI following, holding off on EGD due to shortness of  breath.  6. Hypertension. As needed hydralazine.  7. History of asthma/COPD. No wheezing, trial of steroids as in #1 above.   Code Status : No CPR & DNI  Family Communication  : daughter  Disposition Plan  : Stay on tele  Barriers For Discharge : SOB  Consults  : Cards, Pulm, GI  Procedures  :   VQ scan stable  CT chest Non Contrast - non acute  TTE  DVT Prophylaxis  :   Heparin gtt  Lab Results  Component Value Date   PLT 248 07/07/2015    Antibiotics  :  Anti-infectives    None        Objective:   Filed Vitals:   07/07/15 0400 07/07/15 0757 07/07/15 0817 07/07/15 1232  BP: 167/58  160/70   Pulse: 66  70   Temp:   98.3 F (36.8 C)   TempSrc:   Oral   Resp: 16  18   Height:      Weight:      SpO2: 100% 100% 100% 100%    Wt Readings from Last 3 Encounters:  07/07/15 56.019 kg (123 lb 8 oz)  06/20/15 63.504 kg (140 lb)  06/04/15 63.504 kg (140 lb)     Intake/Output Summary (Last 24 hours) at 07/07/15 1402 Last data filed at 07/07/15 0700  Gross per 24 hour  Intake      0 ml  Output    550 ml  Net   -550 ml     Physical Exam  Awake Alert, Oriented X 3, No new F.N deficits, Normal affect [place].AT,PERRAL Supple Neck,No JVD, No cervical lymphadenopathy appriciated.  Symmetrical Chest wall movement, Good air movement bilaterally, CTAB RRR,No Gallops,Rubs or new Murmurs, No Parasternal Heave +ve B.Sounds, Abd Soft, No tenderness, No organomegaly appriciated, No rebound - guarding or rigidity. No Cyanosis, Clubbing or edema, No new Rash or bruise      Data Review:    CBC  Recent Labs Lab 07/05/15 1709 07/07/15 0540  WBC 7.1 7.0  HGB 12.5 12.5  HCT 35.6* 35.4*  PLT 268 248  MCV 89.4 90.5  MCH 31.4 32.0  MCHC 35.1 35.3  RDW 16.0* 17.0*  LYMPHSABS 1.4  --   MONOABS 0.7  --   EOSABS 0.2  --   BASOSABS 0.0  --     Chemistries   Recent Labs Lab 07/05/15 1709 07/06/15 0411 07/07/15 0540  NA 138 139 140  K 3.9 4.0 4.2  CL  105 107 106  CO2 20* 21* 16*  GLUCOSE 101* 100* 227*  BUN 37* 31* 38*  CREATININE 1.62* 1.49* 1.86*  CALCIUM 10.1 9.1 9.1  AST 24  --   --   ALT 24  --   --   ALKPHOS 63  --   --   BILITOT 0.9  --   --    ------------------------------------------------------------------------------------------------------------------ No results for input(s): CHOL, HDL, LDLCALC, TRIG, CHOLHDL, LDLDIRECT in the last 72 hours.  Lab Results  Component Value Date   HGBA1C 6.4* 04/07/2015   ------------------------------------------------------------------------------------------------------------------  Recent Labs  07/06/15 1501  TSH 0.029*   ------------------------------------------------------------------------------------------------------------------ No results for input(s): VITAMINB12, FOLATE, FERRITIN, TIBC, IRON, RETICCTPCT in the last 72 hours.  Coagulation profile No results for input(s): INR, PROTIME in the last 168 hours.  No results for input(s): DDIMER in the last 72 hours.  Cardiac Enzymes  Recent Labs Lab 07/06/15 1501 07/06/15 2123 07/07/15 1308  TROPONINI 0.28* 0.77* 0.40*   ------------------------------------------------------------------------------------------------------------------    Component Value Date/Time   BNP 238.3* 06/20/2015 1807    Inpatient Medications  Scheduled Meds: . [START ON 07/08/2015] amiodarone  400 mg Oral Daily  . cycloSPORINE  1 drop Both Eyes BID  . fluticasone  2 spray Each Nare Daily  . heparin  3,000 Units Intravenous Once  . ipratropium-albuterol  3 mL Nebulization TID  . latanoprost  1 drop Both Eyes QHS  . methylPREDNISolone (SOLU-MEDROL) injection  80 mg Intravenous Q6H  . metoprolol  2.5 mg Intravenous Q12H  . mometasone-formoterol  2 puff Inhalation BID  . pantoprazole  40 mg Oral  BID  . polyvinyl alcohol  1 drop Both Eyes QID   Continuous Infusions: . amiodarone 30 mg/hr (07/07/15 4098)  . heparin     PRN  Meds:.albuterol, hydrALAZINE, HYDROmorphone (DILAUDID) injection, nitroGLYCERIN, technetium TC 65M diethylenetriame-pentaacetic acid  Micro Results Recent Results (from the past 240 hour(s))  MRSA PCR Screening     Status: None   Collection Time: 07/06/15  6:24 AM  Result Value Ref Range Status   MRSA by PCR NEGATIVE NEGATIVE Final    Comment:        The GeneXpert MRSA Assay (FDA approved for NASAL specimens only), is one component of a comprehensive MRSA colonization surveillance program. It is not intended to diagnose MRSA infection nor to guide or monitor treatment for MRSA infections.     Radiology Reports Ct Abdomen Pelvis Wo Contrast  07/05/2015  CLINICAL DATA:  Bloating, difficulty swallowing, history small-bowel obstruction, ventral hernia, GERD, stomach ulcers, hypertension, type II diabetes mellitus, COPD, asthma EXAM: CT ABDOMEN AND PELVIS WITHOUT CONTRAST TECHNIQUE: Multidetector CT imaging of the abdomen and pelvis was performed following the standard protocol without IV contrast. Sagittal and coronal MPR images reconstructed from axial data set. Patient drank dilute oral contrast for exam. COMPARISON:  01/10/2015 FINDINGS: Lung bases clear. Multiple low-attenuation hepatic foci likely cysts, unchanged. Gallbladder surgically absent. Remainder of liver, spleen, pancreas, kidneys, and adrenal glands normal. Extensive atherosclerotic calcifications. Questionable mild wall thickening of distal esophagus. Increased thickening of the gastric wall versus the previous exam, nonspecific. Small ventral hernia containing nonobstructed bowel loop. Scattered colonic diverticulosis without evidence of diverticulitis or bowel obstruction. Bladder, ureters, uterus and adnexa normal appearance. Appendix not visualized. No mass, adenopathy, free air or free fluid. Bones demineralized. IMPRESSION: No definite acute intra abdominal abnormalities. Questionable mild wall thickening of the gastric wall  versus artifact from underdistention ; consider followup endoscopy or upper GI assessment if patient has persistent symptoms. Electronically Signed   By: Ulyses Southward M.D.   On: 07/05/2015 19:53   Dg Chest 1 View  07/05/2015  CLINICAL DATA:  Dysphagia EXAM: CHEST 1 VIEW COMPARISON:  06/20/2015 FINDINGS: There is unchanged mild cardiomegaly. The lungs are clear. No large effusions. Normal pulmonary vasculature. Unremarkable hilar and mediastinal contours, unchanged. IMPRESSION: Cardiomegaly, unchanged.  No acute cardiopulmonary findings. Electronically Signed   By: Ellery Plunk M.D.   On: 07/05/2015 22:29   Dg Chest 2 View  06/20/2015  CLINICAL DATA:  Shortness of breath and cough EXAM: CHEST  2 VIEW COMPARISON:  06/20/2015, 1625 hours FINDINGS: Cardiac shadow is again mildly enlarged. Lungs are well aerated bilaterally. No acute bony abnormality is seen. Stable compression deformity is noted in the lower thoracic spine. IMPRESSION: No acute abnormality noted.  No change from 2 hours previous Electronically Signed   By: Alcide Clever M.D.   On: 06/20/2015 18:42   Dg Chest 2 View  06/20/2015  CLINICAL DATA:  Pt here with cough that started last week, hx of bronchitis last year EXAM: CHEST  2 VIEW COMPARISON:  01/27/2015 FINDINGS: The heart is enlarged. No focal consolidations or pleural effusions. Stable elevation of right hemidiaphragm. Surgical clips are noted in the upper abdomen. IMPRESSION: Stable cardiomegaly. Electronically Signed   By: Norva Pavlov M.D.   On: 06/20/2015 17:44   Ct Chest Wo Contrast  07/07/2015  CLINICAL DATA:  Shortness of breath the last few days. Unable to have contrast due to renal function Hx: HTN, Stroke, COPD, Diabetes EXAM: CT CHEST WITHOUT CONTRAST TECHNIQUE: Multidetector CT imaging  of the chest was performed following the standard protocol without IV contrast. COMPARISON:  CT abdomen 01/10/2015 and earlier studies FINDINGS: Mediastinum/Lymph Nodes: Coarse coronary  and aortic calcifications. No hilar or mediastinal adenopathy, sensitivity decreased without IV contrast. No pericardial effusion. Lungs/Pleura: Lungs clear.  No pleural effusion.  No pneumothorax. Upper abdomen: Cholecystectomy clips. Stable low-attenuation liver lesions since previous CT abdomen 05/21/2014 probably cysts. Musculoskeletal: T11 compression fracture deformity with at least 50% loss of height anteriorly, stable 05/21/2014. No retropulsion. No posterior element involvement. No new fracture. Sternum intact. IMPRESSION: 1. No acute abnormality. 2. Atherosclerosis, including aortic and coronary artery disease. Please note that although the presence of coronary artery calcium documents the presence of coronary artery disease, the severity of this disease and any potential stenosis cannot be assessed on this non-gated CT examination. Assessment for potential risk factor modification, dietary therapy or pharmacologic therapy may be warranted, if clinically indicated. 3. Old L2-T11 compression deformity. Electronically Signed   By: Corlis Leak M.D.   On: 07/07/2015 13:07   Nm Pulmonary Perf And Vent  07/07/2015  CLINICAL DATA:  Short of breath EXAM: NUCLEAR MEDICINE VENTILATION - PERFUSION LUNG SCAN TECHNIQUE: Ventilation images were obtained in multiple projections using inhaled aerosol Tc-39m DTPA. Perfusion images were obtained in multiple projections after intravenous injection of Tc-62m MAA. RADIOPHARMACEUTICALS:  29.5 mCi Technetium-56m DTPA aerosol inhalation and 4.2 mCi Technetium-56m MAA IV COMPARISON:  Chest x-ray 07/06/2015 FINDINGS: Ventilation: No focal ventilation defect. Perfusion: No wedge shaped peripheral perfusion defects to suggest acute pulmonary embolism. IMPRESSION: Normal ventilation perfusion scan. Very low probability for pulmonary embolism. Electronically Signed   By: Marlan Palau M.D.   On: 07/07/2015 12:57   Dg Chest Port 1 View  07/06/2015  CLINICAL DATA:  Dyspnea EXAM:  PORTABLE CHEST 1 VIEW COMPARISON:  07/05/2015 FINDINGS: Cardiomediastinal silhouette is stable. No segmental infiltrate or pleural effusion. Central mild vascular congestion without convincing pulmonary edema. Elevation of the right hemidiaphragm again noted. IMPRESSION: No segmental infiltrate or pleural effusion. Central mild vascular congestion without convincing pulmonary edema. Elevation of the right hemidiaphragm again noted. Electronically Signed   By: Natasha Mead M.D.   On: 07/06/2015 09:58    Time Spent in minutes  35   Yalena Colon K M.D on 07/07/2015 at 2:02 PM  Between 7am to 7pm - Pager - [phone]  After 7pm go to [email or web address] - password [email or web address]  [facility] -  Office  [phone]

## 2015-07-07 NOTE — Progress Notes (Signed)
ANTICOAGULATION CONSULT NOTE - Follow up [facility] for heparin Indication: chest pain/ACS  No Known Allergies  Patient Measurements: Height: 5\' 6"  (167.6 cm) Weight: 123 lb 8 oz (56.019 kg) IBW/kg (Calculated) : 59.3 Heparin Dosing Weight: 56 kg  Vital Signs: Temp: 98.6 F (37 C) (04/05 2032) Temp Source: Oral (04/05 2032) BP: 188/66 mmHg (04/05 2000) Pulse Rate: 97 (04/05 2000)  Labs:  Recent Labs  07/05/15 1709 07/06/15 0411  07/06/15 2123 07/07/15 0540 07/07/15 1308 07/07/15 1831 07/07/15 2049  HGB 12.5  --   --   --  12.5  --   --   --   HCT 35.6*  --   --   --  35.4*  --   --   --   PLT 268  --   --   --  248  --   --   --   HEPARINUNFRC  --   --   --   --   --   --   --  0.30  CREATININE 1.62* 1.49*  --   --  1.86*  --   --   --   TROPONINI <0.03  --   < > 0.77*  --  0.40* 0.41*  --   < > = values in this interval not displayed.  Estimated Creatinine Clearance: 17.8 mL/min (by C-G formula based on Cr of 1.86).   Medical History: Past Medical History  Diagnosis Date  . Pneumonia   . Hyperlipidemia   . Hypertension   . Small bowel obstruction ([place])     "I don't know how many" (01/11/2015)  . Ventral hernia with bowel obstruction   . Asthma   . Hyperlipidemia   . Stroke ([place])     "light one"  . COPD (chronic obstructive pulmonary disease) ([place])   . Hypothyroidism   . Type II diabetes mellitus ([place])   . Anemia     previous blood transfusions  . GERD (gastroesophageal reflux disease)   . History of stomach ulcers   . Arthritis     "all over"  . Renal insufficiency   . Depression     "light case"  . SIADH (syndrome of inappropriate ADH production) ([place])     [name] 01/10/2015    Medications:  Prescriptions prior to admission  Medication Sig Dispense Refill Last Dose  . acetaminophen (TYLENOL) 325 MG tablet Take 325 mg by mouth every 6 (six) hours as needed for mild pain or fever.    07/04/2015 at Unknown time  . ADVAIR DISKUS  250-50 MCG/DOSE AEPB INHALE 1 PUFF INTO THE LUNGS 2 (TWO) TIMES DAILY AS NEEDED FOR SHORTNESS OF BREATH  3 07/04/2015 at Unknown time  . albuterol (PROVENTIL HFA;VENTOLIN HFA) 108 (90 Base) MCG/ACT inhaler Inhale 2 puffs into the lungs every 6 (six) hours as needed for wheezing or shortness of breath. 1 Inhaler 2 not used  . amLODipine (NORVASC) 10 MG tablet TAKE 1 TABLET BY MOUTH DAILY FOR HYPERTENSION 30 tablet 3 07/04/2015 at Unknown time  . aspirin EC 81 MG tablet Take 81 mg by mouth every morning.   07/05/2015 at Unknown time  . atorvastatin (LIPITOR) 10 MG tablet TAKE 1 TABLET BY MOUTH EVERY DAY (Patient taking differently: TAKE 1 TABLET BY MOUTH EVERY DAY IN MORNING) 30 tablet 5 07/04/2015 at Unknown time  . b complex vitamins tablet Take 1 tablet by mouth daily.    07/04/2015 at Unknown time  . calcium-vitamin D (OSCAL WITH D) 500-200  MG-UNIT per tablet Take 1 tablet by mouth daily with breakfast.    07/04/2015 at Unknown time  . chlorhexidine (PERIDEX) 0.12 % solution 15 mLs by Mouth Rinse route 2 (two) times daily. (Patient taking differently: 15 mLs by Mouth Rinse route every evening. ) 120 mL 0 07/04/2015 at Unknown time  . cloNIDine (CATAPRES) 0.1 MG tablet Take 1 tablet (0.1 mg total) by mouth 2 (two) times daily. 60 tablet 11 07/05/2015 at am  . [employer] GAS RELIEF 80 MG chewable tablet CHEW 1 TABLET BY MOUTH 4 TIMES A DAY AS NEEDED FOR FLATULENCE 30 tablet 0 07/04/2015 at Unknown time  . diclofenac sodium (VOLTAREN) 1 % GEL Apply 2 g topically daily as needed (for pain).    07/05/2015 at Unknown time  . feeding supplement (BOOST / RESOURCE BREEZE) LIQD Take 1 Container by mouth 3 (three) times daily between meals. 90 Container 0 07/05/2015 at Unknown time  . fluticasone (FLONASE) 50 MCG/ACT nasal spray Place 2 sprays into both nostrils daily. 16 g 3 07/05/2015 at Unknown time  . gabapentin (NEURONTIN) 300 MG capsule TAKE 1 CAPSULE (300 MG TOTAL) BY MOUTH 3 (THREE) TIMES DAILY. 90 capsule 3 07/05/2015 at 1 dose  .  guaiFENesin (MUCINEX) 600 MG 12 hr tablet Take 1 tablet (600 mg total) by mouth 2 (two) times daily. (Patient taking differently: Take 600 mg by mouth 2 (two) times daily as needed for cough or to loosen phlegm. ) 14 tablet 0 07/04/2015 at Unknown time  . hydrALAZINE (APRESOLINE) 100 MG tablet TAKE 1 TABLET (100 MG TOTAL) BY MOUTH 3 (THREE) TIMES DAILY. 90 tablet 3 07/05/2015 at 1 dose  . isosorbide mononitrate (IMDUR) 30 MG 24 hr tablet Take one tablet by mouth once daily. If Chest Pains occur increase to two tablets daily. (Patient taking differently: Take 30 mg by mouth daily. ) 90 tablet 3 07/05/2015 at Unknown time  . lactulose (CHRONULAC) 10 GM/15ML solution Take 15 mLs (10 g total) by mouth daily as needed for mild constipation. 240 mL 0 Past Month at Unknown time  . latanoprost (XALATAN) 0.005 % ophthalmic solution Place 1 drop into both eyes at bedtime. 2.5 mL 12 07/04/2015 at Unknown time  . levothyroxine (SYNTHROID, LEVOTHROID) 200 MCG tablet Take one tablet by mouth once daily 30 minutes before breakfast for thyroid (Patient taking differently: Take 200 mcg by mouth daily before breakfast. Take one tablet by mouth once daily 30 minutes before breakfast for thyroid) 30 tablet 3 07/05/2015 at Unknown time  . metoprolol succinate (TOPROL-XL) 25 MG 24 hr tablet TAKE 1 TABLET (25 MG TOTAL) BY MOUTH DAILY. 30 tablet 3 07/05/2015 at 0900  . Multiple Vitamins-Minerals (MULTIVITAMIN WITH MINERALS) tablet Take 1 tablet by mouth every morning.    07/04/2015 at Unknown time  . pantoprazole (PROTONIX) 40 MG tablet TAKE 1 TABLET BY MOUTH TWICE A DAY 60 tablet 11 07/05/2015 at am  . Polyethyl Glycol-Propyl Glycol 0.4-0.3 % SOLN Place 1 drop into both eyes 4 (four) times daily.    07/05/2015 at Unknown time  . polyethylene glycol powder (GLYCOLAX/MIRALAX) powder Take 17 g by mouth 2 (two) times daily as needed. (Patient taking differently: Take 17 g by mouth 2 (two) times daily. ) 3350 g 1 07/05/2015 at Unknown time  . RESTASIS  0.05 % ophthalmic emulsion USE 1 DROP INTO BOTH EYES TWICE DAILY 60 mL 1 07/05/2015 at am  . sennosides-docusate sodium (SENOKOT-S) 8.6-50 MG tablet Take 1 tablet by mouth 2 (two) times daily.  07/04/2015 at am  . sertraline (ZOLOFT) 100 MG tablet TAKE 1 TABLET BY MOUTH ONCE DAILY FOR DEPRESSION 30 tablet 5 07/05/2015 at Unknown time  . sodium chloride 1 g tablet TAKE 1 TABLET BY MOUTH THREE TIMES DAILY 90 tablet 2 07/05/2015 at 1 dose  . traMADol (ULTRAM) 50 MG tablet Take 50 mg by mouth 2 (two) times daily as needed for moderate pain.    Past Month at Unknown time   Scheduled:  . [START ON 07/08/2015] amiodarone  400 mg Oral Daily  . cycloSPORINE  1 drop Both Eyes BID  . fluticasone  2 spray Each Nare Daily  . insulin aspart  0-5 Units Subcutaneous QHS  . [START ON 07/08/2015] insulin aspart  0-9 Units Subcutaneous TID WC  . [START ON 07/08/2015] ipratropium-albuterol  3 mL Nebulization BID  . latanoprost  1 drop Both Eyes QHS  . methylPREDNISolone (SOLU-MEDROL) injection  80 mg Intravenous Q6H  . metoprolol  2.5 mg Intravenous Q12H  . mometasone-formoterol  2 puff Inhalation BID  . pantoprazole  40 mg Oral BID  . polyvinyl alcohol  1 drop Both Eyes QID    Assessment: 4 yoF, pharmacy consulted to dose heparin for ACS. Not on anticoagulation PTA. First HL is borderline therapeutic at 0.3.    Goal of Therapy:  Heparin level 0.3-0.7 units/ml Monitor platelets by anticoagulation protocol: Yes   Plan:  Increase heparin gtt slightly to 700 units/hr Check confirmatory HL with AM labs Monitor daily HL, CBC, s/s of bleed  [name], PharmD, [facility] Clinical Pharmacist Pager [phone] 07/07/2015 9:33 PM

## 2015-07-08 ENCOUNTER — Encounter: Payer: Self-pay | Admitting: Internal Medicine

## 2015-07-08 ENCOUNTER — Encounter (HOSPITAL_COMMUNITY): Payer: Self-pay | Admitting: *Deleted

## 2015-07-08 ENCOUNTER — Encounter (HOSPITAL_COMMUNITY)
Admission: EM | Disposition: A | Discharge status: Home / Self Care | Payer: Self-pay | Source: Home / Self Care | Attending: Internal Medicine

## 2015-07-08 ENCOUNTER — Inpatient Hospital Stay (HOSPITAL_COMMUNITY): Payer: Medicare Other

## 2015-07-08 DIAGNOSIS — R0600 Dyspnea, unspecified: Secondary | ICD-10-CM | POA: Insufficient documentation

## 2015-07-08 DIAGNOSIS — R0602 Shortness of breath: Secondary | ICD-10-CM

## 2015-07-08 DIAGNOSIS — I509 Heart failure, unspecified: Secondary | ICD-10-CM

## 2015-07-08 HISTORY — PX: ESOPHAGOGASTRODUODENOSCOPY: SHX5428

## 2015-07-08 LAB — URINALYSIS, ROUTINE W REFLEX MICROSCOPIC
Bilirubin Urine: NEGATIVE
Glucose, UA: 250 mg/dL — AB
Hgb urine dipstick: NEGATIVE
Ketones, ur: NEGATIVE mg/dL
Leukocytes, UA: NEGATIVE
Nitrite: NEGATIVE
Protein, ur: 100 mg/dL — AB
Specific Gravity, Urine: 1.016 (ref 1.005–1.030)
pH: 6 (ref 5.0–8.0)

## 2015-07-08 LAB — BLOOD GAS, ARTERIAL
Acid-base deficit: 4 mmol/L — ABNORMAL HIGH (ref 0.0–2.0)
Bicarbonate: 18.4 mEq/L — ABNORMAL LOW (ref 20.0–24.0)
Drawn by: 312971
O2 Content: 3 L/min
O2 Saturation: 98.8 %
Patient temperature: 98.6
TCO2: 19.1 mmol/L (ref 0–100)
pCO2 arterial: 22.5 mmHg — ABNORMAL LOW (ref 35.0–45.0)
pH, Arterial: 7.523 — ABNORMAL HIGH (ref 7.350–7.450)
pO2, Arterial: 124 mmHg — ABNORMAL HIGH (ref 80.0–100.0)

## 2015-07-08 LAB — BASIC METABOLIC PANEL
Anion gap: 16 — ABNORMAL HIGH (ref 5–15)
BUN: 33 mg/dL — ABNORMAL HIGH (ref 6–20)
CO2: 17 mmol/L — ABNORMAL LOW (ref 22–32)
Calcium: 9.6 mg/dL (ref 8.9–10.3)
Chloride: 106 mmol/L (ref 101–111)
Creatinine, Ser: 1.34 mg/dL — ABNORMAL HIGH (ref 0.44–1.00)
GFR calc Af Amer: 39 mL/min — ABNORMAL LOW (ref >60)
GFR calc non Af Amer: 34 mL/min — ABNORMAL LOW (ref >60)
Glucose, Bld: 249 mg/dL — ABNORMAL HIGH (ref 65–99)
Potassium: 3.7 mmol/L (ref 3.5–5.1)
Sodium: 139 mmol/L (ref 135–145)

## 2015-07-08 LAB — CBC
HCT: 35.9 % — ABNORMAL LOW (ref 36.0–46.0)
Hemoglobin: 12 g/dL (ref 12.0–15.0)
MCH: 30.5 pg (ref 26.0–34.0)
MCHC: 33.4 g/dL (ref 30.0–36.0)
MCV: 91.1 fL (ref 78.0–100.0)
Platelets: 208 10*3/uL (ref 150–400)
RBC: 3.94 MIL/uL (ref 3.87–5.11)
RDW: 17.3 % — ABNORMAL HIGH (ref 11.5–15.5)
WBC: 17 10*3/uL — ABNORMAL HIGH (ref 4.0–10.5)

## 2015-07-08 LAB — LACTIC ACID, PLASMA
Lactic Acid, Venous: 3.5 mmol/L (ref 0.5–2.0)
Lactic Acid, Venous: 4.3 mmol/L (ref 0.5–2.0)

## 2015-07-08 LAB — HEPARIN LEVEL (UNFRACTIONATED): Heparin Unfractionated: 0.54 IU/mL (ref 0.30–0.70)

## 2015-07-08 LAB — ECHOCARDIOGRAM COMPLETE
Height: 66 in
Weight: 1998.4 oz

## 2015-07-08 LAB — URINE MICROSCOPIC-ADD ON

## 2015-07-08 LAB — GLUCOSE, CAPILLARY
Glucose-Capillary: 235 mg/dL — ABNORMAL HIGH (ref 65–99)
Glucose-Capillary: 265 mg/dL — ABNORMAL HIGH (ref 65–99)
Glucose-Capillary: 190 mg/dL — ABNORMAL HIGH (ref 65–99)
Glucose-Capillary: 261 mg/dL — ABNORMAL HIGH (ref 65–99)

## 2015-07-08 SURGERY — EGD (ESOPHAGOGASTRODUODENOSCOPY)
Anesthesia: Monitor Anesthesia Care

## 2015-07-08 MED ORDER — DEXTROSE-NACL 5-0.45 % IV SOLN
INTRAVENOUS | Status: DC
  Administered 2015-07-08: 11:00:00 via INTRAVENOUS

## 2015-07-08 MED ORDER — BUTAMBEN-TETRACAINE-BENZOCAINE 2-2-14 % EX AERO
INHALATION_SPRAY | CUTANEOUS | Status: DC | PRN
  Administered 2015-07-08: 2 via TOPICAL

## 2015-07-08 MED ORDER — METOPROLOL TARTRATE 1 MG/ML IV SOLN
5.0000 mg | Freq: Two times a day (BID) | INTRAVENOUS | Status: DC
  Administered 2015-07-08 – 2015-07-10 (×4): 5 mg via INTRAVENOUS
  Filled 2015-07-08 (×4): qty 5

## 2015-07-08 MED ORDER — NITROGLYCERIN 2 % TD OINT
0.5000 [in_us] | TOPICAL_OINTMENT | Freq: Four times a day (QID) | TRANSDERMAL | Status: DC
  Administered 2015-07-08 – 2015-07-10 (×8): 0.5 [in_us] via TOPICAL
  Filled 2015-07-08: qty 30
  Filled 2015-07-08 (×2): qty 0.5

## 2015-07-08 MED ORDER — SODIUM CHLORIDE 0.9 % IV BOLUS (SEPSIS)
1000.0000 mL | Freq: Once | INTRAVENOUS | Status: AC
  Administered 2015-07-09: 1000 mL via INTRAVENOUS

## 2015-07-08 MED ORDER — FLUCONAZOLE 40 MG/ML PO SUSR
100.0000 mg | Freq: Every day | ORAL | Status: DC
Start: 2015-07-08 — End: 2015-07-11
  Administered 2015-07-08 – 2015-07-11 (×4): 100 mg via ORAL
  Filled 2015-07-08 (×4): qty 2.5

## 2015-07-08 MED ORDER — AMIODARONE HCL 200 MG PO TABS
200.0000 mg | ORAL_TABLET | Freq: Every day | ORAL | Status: DC
  Administered 2015-07-09 – 2015-07-11 (×3): 200 mg via ORAL
  Filled 2015-07-08 (×3): qty 1

## 2015-07-08 MED ORDER — SODIUM CHLORIDE 0.9 % IV SOLN
INTRAVENOUS | Status: DC
  Administered 2015-07-08: 500 mL via INTRAVENOUS

## 2015-07-08 MED ORDER — NYSTATIN 100000 UNIT/ML MT SUSP
5.0000 mL | Freq: Four times a day (QID) | OROMUCOSAL | Status: DC
  Administered 2015-07-08 – 2015-07-11 (×10): 500000 [IU] via OROMUCOSAL
  Filled 2015-07-08 (×10): qty 5

## 2015-07-08 MED ORDER — METHYLPREDNISOLONE SODIUM SUCC 40 MG IJ SOLR: 40.0000 mg | Freq: Every day | INTRAMUSCULAR | Status: DC

## 2015-07-08 MED ORDER — STERILE WATER FOR INJECTION IV SOLN
INTRAVENOUS | Status: DC
  Administered 2015-07-08 – 2015-07-09 (×2): via INTRAVENOUS
  Filled 2015-07-08 (×3): qty 850

## 2015-07-08 MED ORDER — MIDAZOLAM HCL 5 MG/ML IJ SOLN
INTRAMUSCULAR | Status: AC
  Filled 2015-07-08: qty 2

## 2015-07-08 MED ORDER — MIDAZOLAM HCL 10 MG/2ML IJ SOLN
INTRAMUSCULAR | Status: DC | PRN
  Administered 2015-07-08 (×2): .5 mg via INTRAVENOUS

## 2015-07-08 MED ORDER — DILTIAZEM HCL ER COATED BEADS 120 MG PO CP24: 120.0000 mg | ORAL_CAPSULE | Freq: Every day | ORAL | Status: DC

## 2015-07-08 MED ORDER — DILTIAZEM HCL ER COATED BEADS 120 MG PO CP24
120.0000 mg | ORAL_CAPSULE | Freq: Every day | ORAL | Status: DC
  Administered 2015-07-08 – 2015-07-10 (×3): 120 mg via ORAL
  Filled 2015-07-08 (×3): qty 1

## 2015-07-08 MED ORDER — RISPERIDONE 0.5 MG PO TABS
0.5000 mg | ORAL_TABLET | Freq: Every day | ORAL | Status: DC
  Administered 2015-07-08 – 2015-07-09 (×2): 0.5 mg via ORAL
  Filled 2015-07-08 (×2): qty 1

## 2015-07-08 MED ORDER — FENTANYL CITRATE (PF) 100 MCG/2ML IJ SOLN
INTRAMUSCULAR | Status: AC
  Filled 2015-07-08: qty 2

## 2015-07-08 NOTE — Progress Notes (Addendum)
Patient Name: Shannon [name] Date of Encounter: 07/08/2015  Primary Cardiologist: Dr. Meda Coffee   82 yo female w/ PMH of Type 2 DM, CKD Stage 3, nonobstructive CAD and HTN admitted 04/03 w/ dysphagia and went into rapid aflutter.    Principal Problem:   Dysphagia Active Problems:   CVA (cerebral vascular accident) ([place])   CKD (chronic kidney disease) stage 3, GFR 30-59 ml/min   Chest pain   Respiratory distress   Atrial flutter with rapid ventricular response ([facility])   Acute renal failure superimposed on stage 3 chronic kidney disease ([place])   Encounter for long-term (current) use of other high-risk medications   Dyspnea    SUBJECTIVE  She feels much better today, improved dyspnea, awaiting EGD this PM.   CURRENT MEDS . amiodarone  400 mg Oral Daily  . cycloSPORINE  1 drop Both Eyes BID  . fluticasone  2 spray Each Nare Daily  . insulin aspart  0-5 Units Subcutaneous QHS  . insulin aspart  0-9 Units Subcutaneous TID WC  . ipratropium-albuterol  3 mL Nebulization BID  . latanoprost  1 drop Both Eyes QHS  . [START ON 07/09/2015] methylPREDNISolone (SOLU-MEDROL) injection  40 mg Intravenous Daily  . metoprolol  5 mg Intravenous Q12H  . mometasone-formoterol  2 puff Inhalation BID  . nitroGLYCERIN  0.5 inch Topical 4 times per day  . pantoprazole  40 mg Oral BID  . polyvinyl alcohol  1 drop Both Eyes QID  . risperiDONE  0.5 mg Oral Daily   . amiodarone Stopped (07/08/15 1052)  . dextrose 5 % and 0.45% NaCl 25 mL/hr at 07/08/15 1404  .  sodium bicarbonate 150 mEq in sterile water 1000 mL infusion 75 mL/hr at 07/08/15 1328   OBJECTIVE  Filed Vitals:   07/08/15 0400 07/08/15 0650 07/08/15 0839 07/08/15 1345  BP: 184/75 170/54  180/82  Pulse: 74 74    Temp: 98.4 F (36.9 C)     TempSrc: Oral     Resp: 16 17    Height:      Weight: 124 lb 14.4 oz (56.654 kg)     SpO2: 99% 98% 100%     Intake/Output Summary (Last 24 hours) at 07/08/15 1404 Last data filed at 07/08/15 1338  Gross per 24 hour  Intake 2529.34 ml  Output   1775 ml  Net 754.34 ml   Filed Weights   07/06/15 0610 07/07/15 0343 07/08/15 0400  Weight: 133 lb 2.5 oz (60.4 kg) 123 lb 8 oz (56.019 kg) 124 lb 14.4 oz (56.654 kg)   PHYSICAL EXAM  General: Pleasant, NAD. Neuro: Alert and oriented X 3. Moves all extremities spontaneously. Psych: Normal affect. HEENT:  Normal  Neck: Supple without bruits or JVD. Lungs:  Resp regular and unlabored, CTA. Heart: RRR no s3, s4, or murmurs. Abdomen: Soft, non-tender, non-distended, BS + x 4.  Extremities: No clubbing, cyanosis or edema. DP/PT/Radials 2+ and equal bilaterally.  Accessory Clinical Findings  CBC  Recent Labs  07/05/15 1709 07/07/15 0540 07/08/15 0353  WBC 7.1 7.0 17.0*  NEUTROABS 4.7  --   --   HGB 12.5 12.5 12.0  HCT 35.6* 35.4* 35.9*  MCV 89.4 90.5 91.1  PLT 268 248 [id number]   Basic Metabolic Panel  Recent Labs  07/07/15 0540 07/08/15 0851  NA 140 139  K 4.2 3.7  CL 106 106  CO2 16* 17*  GLUCOSE 227* 249*  BUN 38* 33*  CREATININE 1.86* 1.34*  CALCIUM 9.1 9.6  Liver Function Tests  Recent Labs  07/05/15 1709  AST 24  ALT 24  ALKPHOS 63  BILITOT 0.9  PROT 7.1  ALBUMIN 3.8   Cardiac Enzymes  Recent Labs  07/07/15 1308 07/07/15 1831 07/07/15 2244  TROPONINI 0.40* 0.41* 0.31*   Thyroid Function Tests  Recent Labs  07/06/15 1501  TSH 0.029*    TELE Converted from aflutter to NSR around 3PM yesterday, NSR overnight, some tachycardia this morning.     ECG  No new EKG, EKG last night no acute changes  Echocardiogram 01/14/2015  LV EF: 55% - 60%  ------------------------------------------------------------------- Indications: CAD of native vessels 414.01.  ------------------------------------------------------------------- History: PMH: Chest pain. Atrial fibrillation. Stroke. Risk factors: Hypertension.  Dyslipidemia.  ------------------------------------------------------------------- Study Conclusions  - Left ventricle: The cavity size was normal. Systolic function was  normal. The estimated ejection fraction was in the range of 55%  to 60%. Wall motion was normal; there were no regional wall  motion abnormalities. - Aortic valve: There was mild to moderate regurgitation.  Regurgitation pressure half-time: 365 ms. - Left atrium: The atrium was severely dilated. - Right ventricle: The cavity size was normal. Wall thickness was  normal. Systolic function was normal. - Tricuspid valve: There was mild regurgitation. - Pulmonary arteries: PA peak pressure: 42 mm Hg (S). - Inferior vena cava: The vessel was normal in size. The  respirophasic diameter changes were in the normal range (>= 50%),  consistent with normal central venous pressure.    Radiology/Studies  Ct Abdomen Pelvis Wo Contrast  07/05/2015  CLINICAL DATA:  Bloating, difficulty swallowing, history small-bowel obstruction, ventral hernia, GERD, stomach ulcers, hypertension, type II diabetes mellitus, COPD, asthma EXAM: CT ABDOMEN AND PELVIS WITHOUT CONTRAST TECHNIQUE: Multidetector CT imaging of the abdomen and pelvis was performed following the standard protocol without IV contrast. Sagittal and coronal MPR images reconstructed from axial data set. Patient drank dilute oral contrast for exam. COMPARISON:  01/10/2015 FINDINGS: Lung bases clear. Multiple low-attenuation hepatic foci likely cysts, unchanged. Gallbladder surgically absent. Remainder of liver, spleen, pancreas, kidneys, and adrenal glands normal. Extensive atherosclerotic calcifications. Questionable mild wall thickening of distal esophagus. Increased thickening of the gastric wall versus the previous exam, nonspecific. Small ventral hernia containing nonobstructed bowel loop. Scattered colonic diverticulosis without evidence of diverticulitis or bowel  obstruction. Bladder, ureters, uterus and adnexa normal appearance. Appendix not visualized. No mass, adenopathy, free air or free fluid. Bones demineralized. IMPRESSION: No definite acute intra abdominal abnormalities. Questionable mild wall thickening of the gastric wall versus artifact from underdistention ; consider followup endoscopy or upper GI assessment if patient has persistent symptoms. Electronically Signed   By: Lavonia Dana M.D.   On: 07/05/2015 19:53   Dg Chest 1 View  07/05/2015  CLINICAL DATA:  Dysphagia EXAM: CHEST 1 VIEW COMPARISON:  06/20/2015 FINDINGS: There is unchanged mild cardiomegaly. The lungs are clear. No large effusions. Normal pulmonary vasculature. Unremarkable hilar and mediastinal contours, unchanged. IMPRESSION: Cardiomegaly, unchanged.  No acute cardiopulmonary findings. Electronically Signed   By: Andreas Newport M.D.   On: 07/05/2015 22:29   Dg Chest 2 View  06/20/2015  CLINICAL DATA:  Shortness of breath and cough EXAM: CHEST  2 VIEW COMPARISON:  06/20/2015, 1625 hours FINDINGS: Cardiac shadow is again mildly enlarged. Lungs are well aerated bilaterally. No acute bony abnormality is seen. Stable compression deformity is noted in the lower thoracic spine. IMPRESSION: No acute abnormality noted.  No change from 2 hours previous Electronically Signed   By: Inez Catalina  M.D.   On: 06/20/2015 18:42   Dg Chest 2 View  06/20/2015  CLINICAL DATA:  Pt here with cough that started last week, hx of bronchitis last year EXAM: CHEST  2 VIEW COMPARISON:  01/27/2015 FINDINGS: The heart is enlarged. No focal consolidations or pleural effusions. Stable elevation of right hemidiaphragm. Surgical clips are noted in the upper abdomen. IMPRESSION: Stable cardiomegaly. Electronically Signed   By: Nolon Nations M.D.   On: 06/20/2015 17:44   Ct Chest Wo Contrast  07/07/2015  CLINICAL DATA:  Shortness of breath the last few days. Unable to have contrast due to renal function Hx: HTN,  Stroke, COPD, Diabetes EXAM: CT CHEST WITHOUT CONTRAST TECHNIQUE: Multidetector CT imaging of the chest was performed following the standard protocol without IV contrast. COMPARISON:  CT abdomen 01/10/2015 and earlier studies FINDINGS: Mediastinum/Lymph Nodes: Coarse coronary and aortic calcifications. No hilar or mediastinal adenopathy, sensitivity decreased without IV contrast. No pericardial effusion. Lungs/Pleura: Lungs clear.  No pleural effusion.  No pneumothorax. Upper abdomen: Cholecystectomy clips. Stable low-attenuation liver lesions since previous CT abdomen 05/21/2014 probably cysts. Musculoskeletal: T11 compression fracture deformity with at least 50% loss of height anteriorly, stable 05/21/2014. No retropulsion. No posterior element involvement. No new fracture. Sternum intact. IMPRESSION: 1. No acute abnormality. 2. Atherosclerosis, including aortic and coronary artery disease. Please note that although the presence of coronary artery calcium documents the presence of coronary artery disease, the severity of this disease and any potential stenosis cannot be assessed on this non-gated CT examination. Assessment for potential risk factor modification, dietary therapy or pharmacologic therapy may be warranted, if clinically indicated. 3. Old L2-T11 compression deformity. Electronically Signed   By: Lucrezia Europe M.D.   On: 07/07/2015 13:07   Nm Pulmonary Perf And Vent  07/07/2015  CLINICAL DATA:  Short of breath EXAM: NUCLEAR MEDICINE VENTILATION - PERFUSION LUNG SCAN TECHNIQUE: Ventilation images were obtained in multiple projections using inhaled aerosol Tc-23m DTPA. Perfusion images were obtained in multiple projections after intravenous injection of Tc-80m MAA. RADIOPHARMACEUTICALS:  29.5 mCi Technetium-37m DTPA aerosol inhalation and 4.2 mCi Technetium-73m MAA IV COMPARISON:  Chest x-ray 07/06/2015 FINDINGS: Ventilation: No focal ventilation defect. Perfusion: No wedge shaped peripheral perfusion  defects to suggest acute pulmonary embolism. IMPRESSION: Normal ventilation perfusion scan. Very low probability for pulmonary embolism. Electronically Signed   By: Franchot Gallo M.D.   On: 07/07/2015 12:57   Dg Chest Port 1 View  07/06/2015  CLINICAL DATA:  Dyspnea EXAM: PORTABLE CHEST 1 VIEW COMPARISON:  07/05/2015 FINDINGS: Cardiomediastinal silhouette is stable. No segmental infiltrate or pleural effusion. Central mild vascular congestion without convincing pulmonary edema. Elevation of the right hemidiaphragm again noted. IMPRESSION: No segmental infiltrate or pleural effusion. Central mild vascular congestion without convincing pulmonary edema. Elevation of the right hemidiaphragm again noted. Electronically Signed   By: Lahoma Crocker M.D.   On: 07/06/2015 09:58    ASSESSMENT AND PLAN  1. Paroxysmal atrial flutter with RVR 2. Chest pain with elevated trop 3. Dysphagia: EGD cancelled.  4. Mild fluid overload in the setting aflutter with RVR, given IV lasix.  5. Dyspnea: restrictive lung disease  82 year old female admitted who was admitted with dysphagia, she developed an acute atrial flutter with RVR with respiratory distress and mild fluid overload upon presentation.  She was started on amiodarone drip, and cardioverted back to SR within 24 hours. She was still tachypnic and evaluated by pulmonary who stated that she has restrictive lung disease. Her troponin was mildly  elevated in the settings of a-flutter with RVR and acute on chronic kidney failure.  She now feels better, awaiting EGD this afternoon.  Continue po amiodarone 400 mg po daily. She has normal LVEF in 01/2015, repeat echo is pending. Her recent TSH was 0.029 with high fT4, her Synthroid dose should be decreased. Crea is improving 1.8--> 1.3. BP is elevated, once post EGD start cardizem CD 120 mg po daily. She is now euvolemic. Considering h/o CVA, her CHADS-VASc is 6, she will need to be on long term anticoagulation,  Eliquis 2.5 mg po BID considering her age and impaired kidney function.   Dorothy Spark 07/08/2015

## 2015-07-08 NOTE — Progress Notes (Signed)
CRITICAL VALUE ALERT  Critical value received:  Lactic Acid 4.3  Date of notification:  07/08/15  Time of notification:  1603  Critical value read back:Yes.    Nurse who received alert:  Hubert Azure  MD notified (1st page):  Dr. Candiss Norse  Time of first page:  1605  MD notified (2nd page):  Time of second page:  Responding MD:  Dr. Candiss Norse  Time MD responded:  Sweetser, Harlow Mares

## 2015-07-08 NOTE — Progress Notes (Signed)
Name: Shannon [name] MRN: [id number] DOB: January 13, 1924    ADMISSION DATE:  07/05/2015 CONSULTATION DATE:  4/4  REFERRING MD : [facility]  CHIEF COMPLAINT:  Chest pain  HISTORY OF PRESENT ILLNESS:   82 yo AAF with a history of asthma, dysphagia, chest pain since 3/19, former DNR per records, who was admitted 4/3 and was taken to radiology 4/4 for barium swallow which was canceled per GI and was for EGD later on 4/4. She then developed increased wob, chest pain , a fib with rvr 160s. Placed on bipap but this drove  her PH 7.61 and PCO2 to 11. Pccm consulted and LCB status established. Cardiology was also consulted for chest pain and RVR to her A fib. She will be moved to SDU per [facility] service and PCCM will consult. Again she is a limited code blue with no intubation, cpr, shock, NIMVS is allowed but she is refractory to NIMVS at this time.  PAST MEDICAL HISTORY :   has a past medical history of Pneumonia; Hyperlipidemia; Hypertension; Small bowel obstruction (HCC); Ventral hernia with bowel obstruction; Asthma; Hyperlipidemia; Stroke [facility]); COPD (chronic obstructive pulmonary disease) (HCC); Hypothyroidism; Type II diabetes mellitus (HCC); Anemia; GERD (gastroesophageal reflux disease); History of stomach ulcers; Arthritis; Renal insufficiency; Depression; and SIADH (syndrome of inappropriate ADH production) (HCC).  has past surgical history that includes Tubal ligation; Colectomy; Hernia repair (2015); Esophagogastroduodenoscopy (N/A, 01/19/2014); Esophagogastroduodenoscopy (N/A, 01/20/2014); Esophagogastroduodenoscopy (N/A, 03/19/2014); Cholecystectomy open; Ventral hernia repair (2015); Cataract extraction w/ intraocular lens  implant, bilateral; Gastrojejunostomy; laparotomy (N/A, 01/20/2015); and Lysis of adhesion (N/A, 01/20/2015). Prior to Admission medications   Medication Sig Start Date End Date Taking? Authorizing Provider  acetaminophen (TYLENOL) 325 MG tablet Take 325 mg by mouth every 6 (six)  hours as needed for mild pain or fever.    Yes Historical Provider, MD  ADVAIR DISKUS 250-50 MCG/DOSE AEPB INHALE 1 PUFF INTO THE LUNGS 2 (TWO) TIMES DAILY AS NEEDED FOR SHORTNESS OF BREATH 12/01/14  Yes Historical Provider, MD  albuterol (PROVENTIL HFA;VENTOLIN HFA) 108 (90 Base) MCG/ACT inhaler Inhale 2 puffs into the lungs every 6 (six) hours as needed for wheezing or shortness of breath. 06/04/15  Yes Monica Carter, DO  amLODipine (NORVASC) 10 MG tablet TAKE 1 TABLET BY MOUTH DAILY FOR HYPERTENSION 03/15/15  Yes Kirt Boys, DO  aspirin EC 81 MG tablet Take 81 mg by mouth every morning.   Yes Historical Provider, MD  atorvastatin (LIPITOR) 10 MG tablet TAKE 1 TABLET BY MOUTH EVERY DAY Patient taking differently: TAKE 1 TABLET BY MOUTH EVERY DAY IN MORNING 04/08/15  Yes Kirt Boys, DO  b complex vitamins tablet Take 1 tablet by mouth daily.    Yes Historical Provider, MD  calcium-vitamin D (OSCAL WITH D) 500-200 MG-UNIT per tablet Take 1 tablet by mouth daily with breakfast.    Yes Historical Provider, MD  chlorhexidine (PERIDEX) 0.12 % solution 15 mLs by Mouth Rinse route 2 (two) times daily. Patient taking differently: 15 mLs by Mouth Rinse route every evening.  03/26/14  Yes Alison Murray, MD  cloNIDine (CATAPRES) 0.1 MG tablet Take 1 tablet (0.1 mg total) by mouth 2 (two) times daily. 07/13/14  Yes Lars Masson, MD  [employer] GAS RELIEF 80 MG chewable tablet CHEW 1 TABLET BY MOUTH 4 TIMES A DAY AS NEEDED FOR FLATULENCE 06/28/15  Yes Kirt Boys, DO  diclofenac sodium (VOLTAREN) 1 % GEL Apply 2 g topically daily as needed (for pain).    Yes Historical Provider, MD  feeding supplement (BOOST / RESOURCE BREEZE) LIQD Take 1 Container by mouth 3 (three) times daily between meals. 01/27/15  Yes Shanker Levora Dredge, MD  fluticasone (FLONASE) 50 MCG/ACT nasal spray Place 2 sprays into both nostrils daily. 08/07/14  Yes Kirt Boys, DO  gabapentin (NEURONTIN) 300 MG capsule TAKE 1 CAPSULE (300 MG TOTAL)  BY MOUTH 3 (THREE) TIMES DAILY. 05/31/15  Yes Kirt Boys, DO  guaiFENesin (MUCINEX) 600 MG 12 hr tablet Take 1 tablet (600 mg total) by mouth 2 (two) times daily. Patient taking differently: Take 600 mg by mouth 2 (two) times daily as needed for cough or to loosen phlegm.  06/20/15  Yes Linwood Dibbles, MD  hydrALAZINE (APRESOLINE) 100 MG tablet TAKE 1 TABLET (100 MG TOTAL) BY MOUTH 3 (THREE) TIMES DAILY. 05/31/15  Yes Kirt Boys, DO  isosorbide mononitrate (IMDUR) 30 MG 24 hr tablet Take one tablet by mouth once daily. If Chest Pains occur increase to two tablets daily. Patient taking differently: Take 30 mg by mouth daily.  07/22/14  Yes Monica Carter, DO  lactulose (CHRONULAC) 10 GM/15ML solution Take 15 mLs (10 g total) by mouth daily as needed for mild constipation. 06/02/14  Yes Mahima Pandey, MD  latanoprost (XALATAN) 0.005 % ophthalmic solution Place 1 drop into both eyes at bedtime. 06/02/14  Yes Mahima Glade Lloyd, MD  levothyroxine (SYNTHROID, LEVOTHROID) 200 MCG tablet Take one tablet by mouth once daily 30 minutes before breakfast for thyroid Patient taking differently: Take 200 mcg by mouth daily before breakfast. Take one tablet by mouth once daily 30 minutes before breakfast for thyroid 07/05/15  Yes Kirt Boys, DO  metoprolol succinate (TOPROL-XL) 25 MG 24 hr tablet TAKE 1 TABLET (25 MG TOTAL) BY MOUTH DAILY. 06/17/15  Yes Sharon Seller, NP  Multiple Vitamins-Minerals (MULTIVITAMIN WITH MINERALS) tablet Take 1 tablet by mouth every morning.    Yes Historical Provider, MD  pantoprazole (PROTONIX) 40 MG tablet TAKE 1 TABLET BY MOUTH TWICE A DAY 08/07/14  Yes Kirt Boys, DO  Polyethyl Glycol-Propyl Glycol 0.4-0.3 % SOLN Place 1 drop into both eyes 4 (four) times daily.    Yes Historical Provider, MD  polyethylene glycol powder (GLYCOLAX/MIRALAX) powder Take 17 g by mouth 2 (two) times daily as needed. Patient taking differently: Take 17 g by mouth 2 (two) times daily.  03/12/15  Yes Monica  Carter, DO  RESTASIS 0.05 % ophthalmic emulsion USE 1 DROP INTO BOTH EYES TWICE DAILY 10/19/14  Yes Kirt Boys, DO  sennosides-docusate sodium (SENOKOT-S) 8.6-50 MG tablet Take 1 tablet by mouth 2 (two) times daily.    Yes Historical Provider, MD  sertraline (ZOLOFT) 100 MG tablet TAKE 1 TABLET BY MOUTH ONCE DAILY FOR DEPRESSION 07/05/15  Yes Kirt Boys, DO  sodium chloride 1 g tablet TAKE 1 TABLET BY MOUTH THREE TIMES DAILY 05/19/15  Yes Kirt Boys, DO  traMADol (ULTRAM) 50 MG tablet Take 50 mg by mouth 2 (two) times daily as needed for moderate pain.    Yes Historical Provider, MD   No Known Allergies  FAMILY HISTORY:  family history includes Diabetes in her brother; Hypertension in her mother; Stroke in her mother. There is no history of Heart attack. SOCIAL HISTORY:  reports that she has never smoked. She has quit using smokeless tobacco. Her smokeless tobacco use included Snuff. She reports that she does not drink alcohol or use illicit drugs.  REVIEW OF SYSTEMS:   10 point review of system taken, please see HPI for positives and negatives.  SUBJECTIVE:  Increase resp rate  VITAL SIGNS: Temp:  [98.4 F (36.9 C)-98.6 F (37 C)] 98.4 F (36.9 C) (04/06 0400) Pulse Rate:  [70-111] 74 (04/06 0650) Resp:  [12-37] 17 (04/06 0650) BP: (163-188)/(54-76) 170/54 mmHg (04/06 0650) SpO2:  [98 %-100 %] 100 % (04/06 0839) Weight:  [56.654 kg (124 lb 14.4 oz)] 56.654 kg (124 lb 14.4 oz) (04/06 0400)  PHYSICAL EXAMINATION: General:  [id number] Neuro: Alert and follows commands HEENT: PERL 4mm, no JVD/LAN Cardiovascular:  HSIR IR 124 Lungs:  Exp wheeze, fine basilar crackles Abdomen:  Soft + bs Musculoskeletal: intact Skin:  Warm and moist   Recent Labs Lab 07/06/15 0411 07/07/15 0540 07/08/15 0851  NA 139 140 139  K 4.0 4.2 3.7  CL 107 106 106  CO2 21* 16* 17*  BUN 31* 38* 33*  CREATININE 1.49* 1.86* 1.34*  GLUCOSE 100* 227* 249*    Recent Labs Lab 07/05/15 1709  07/07/15 0540 07/08/15 0353  HGB 12.5 12.5 12.0  HCT 35.6* 35.4* 35.9*  WBC 7.1 7.0 17.0*  PLT 268 248 208   Ct Chest Wo Contrast  07/07/2015  CLINICAL DATA:  Shortness of breath the last few days. Unable to have contrast due to renal function Hx: HTN, Stroke, COPD, Diabetes EXAM: CT CHEST WITHOUT CONTRAST TECHNIQUE: Multidetector CT imaging of the chest was performed following the standard protocol without IV contrast. COMPARISON:  CT abdomen 01/10/2015 and earlier studies FINDINGS: Mediastinum/Lymph Nodes: Coarse coronary and aortic calcifications. No hilar or mediastinal adenopathy, sensitivity decreased without IV contrast. No pericardial effusion. Lungs/Pleura: Lungs clear.  No pleural effusion.  No pneumothorax. Upper abdomen: Cholecystectomy clips. Stable low-attenuation liver lesions since previous CT abdomen 05/21/2014 probably cysts. Musculoskeletal: T11 compression fracture deformity with at least 50% loss of height anteriorly, stable 05/21/2014. No retropulsion. No posterior element involvement. No new fracture. Sternum intact. IMPRESSION: 1. No acute abnormality. 2. Atherosclerosis, including aortic and coronary artery disease. Please note that although the presence of coronary artery calcium documents the presence of coronary artery disease, the severity of this disease and any potential stenosis cannot be assessed on this non-gated CT examination. Assessment for potential risk factor modification, dietary therapy or pharmacologic therapy may be warranted, if clinically indicated. 3. Old L2-T11 compression deformity. Electronically Signed   By: Corlis Leak M.D.   On: 07/07/2015 13:07   Nm Pulmonary Perf And Vent  07/07/2015  CLINICAL DATA:  Short of breath EXAM: NUCLEAR MEDICINE VENTILATION - PERFUSION LUNG SCAN TECHNIQUE: Ventilation images were obtained in multiple projections using inhaled aerosol Tc-59m DTPA. Perfusion images were obtained in multiple projections after intravenous  injection of Tc-79m MAA. RADIOPHARMACEUTICALS:  29.5 mCi Technetium-70m DTPA aerosol inhalation and 4.2 mCi Technetium-60m MAA IV COMPARISON:  Chest x-ray 07/06/2015 FINDINGS: Ventilation: No focal ventilation defect. Perfusion: No wedge shaped peripheral perfusion defects to suggest acute pulmonary embolism. IMPRESSION: Normal ventilation perfusion scan. Very low probability for pulmonary embolism. Electronically Signed   By: Marlan Palau M.D.   On: 07/07/2015 12:57    ASSESSMENT     Chest pain, appears to be related to her   Tachypnea that evolves periodically into resp distres  A-fib (HCC), new  Hx CVA (cerebral vascular accident) (HCC)  CKD (chronic kidney disease) stage 3, GFR 30-59 ml/min  Dysphagia, chronic; ? Worse at this time. Hx paraesophageal hernia and prior gastric outlet obstruction   Discussion: 82 yo AAF with a history of asthma, dysphagia and a complicated GI history as above, chest pain since 3/19,  former DNR per records, who was admitted 4/3 after episode dysphagia / choking. She was to undergo barium swallow and then EGD, but developed acute respiratory distress so delayed. We evaluated her at that time, principally to determine whether she could benefit from ventilation - note our discussions, pt is DNI, NCB which I believe is most appropriate. She developed A Fib + RVR as well (new) at that time. She has not had her EGD yet, still feels a chest fullness and states she is having difficulty taking deep breaths. On exam she is not wheezing, has small breaths consistent with a restrictive process. She has no ILD on imaging. ABG has revealed metabolic acidosis and a respiratory alkalosis. The respiratory alk is a marker of her tachypnea, need to sort out a cau  I suspect that her dyspnea and tachypnea are multifactorial > 1. Restrictive lung disease, largest contributor abdominal discomfort and non compliance. Consider also some tracheal or UA fixed obstruction (that might be  contributing to her dysphagia as well) but no stridor or other evidence to support this. 2. Metabolic acidosis with elevated AG, initially without AG on presentation. ? Due to renal failure. 3. Some degree of agitation / anxiety, although this appears to be a minor player.   RECS:  - consider PFT's but I am confident that they will be restricted, will not identify a cause of that restriction. Would be difficult for her to perform so I will defer - start bicarb gtt for 24h - workup cause for her metabolic acidosis - EGD to clarify cause and contributors to her dysphagia, esophageal thickening and abdominal restriction  - ? Address component anxiety > agree w risperdol - ? Evaluate for respiratory muscle weakness or diaphragmatic paralysis, consider sniff test - no wheeze present, would taper her steroids quickly to off > could be contributing to some of the above   Levy Pupa, MD, PhD 07/08/2015, 11:48 AM [facility] [phone] or if no answer [phone]

## 2015-07-08 NOTE — Progress Notes (Signed)
Spoke to [employer] at Infection Prevention [phone]).  Okay to d/c droplet precautions.  Pt tested positive for Flu on 06/20/15 and does not need to be on droplet this admission.

## 2015-07-08 NOTE — Progress Notes (Addendum)
Patient Name: Lamyiah Omoto Date of Encounter: 07/08/2015  Primary Cardiologist: Dr. Meda Coffee   82 yo female w/ PMH of Type 2 DM, CKD Stage 3, nonobstructive CAD and HTN admitted 04/03 w/ dysphagia and went into rapid aflutter during admission.    Principal Problem:   Dysphagia Active Problems:   CVA (cerebral vascular accident) ([place])   CKD (chronic kidney disease) stage 3, GFR 30-59 ml/min   Chest pain   Respiratory distress   Atrial flutter with rapid ventricular response (HCC)   Acute renal failure superimposed on stage 3 chronic kidney disease ([place])   Encounter for long-term (current) use of other high-risk medications    SUBJECTIVE  Pt is feeling much better this morning, sitting up in the bed and interacting with family at this bedside. No more SOB throughout the night into the morning. Did get up to walk to the bathroom this am and reports a mild chest pressure lasting only a couple of minutes while walking, but no SOB. At this time is chest pain free.   CURRENT MEDS . amiodarone  400 mg Oral Daily  . cycloSPORINE  1 drop Both Eyes BID  . fluticasone  2 spray Each Nare Daily  . insulin aspart  0-5 Units Subcutaneous QHS  . insulin aspart  0-9 Units Subcutaneous TID WC  . ipratropium-albuterol  3 mL Nebulization BID  . latanoprost  1 drop Both Eyes QHS  . methylPREDNISolone (SOLU-MEDROL) injection  80 mg Intravenous Q6H  . metoprolol  2.5 mg Intravenous Q12H  . mometasone-formoterol  2 puff Inhalation BID  . nitroGLYCERIN  0.5 inch Topical 4 times per day  . pantoprazole  40 mg Oral BID  . polyvinyl alcohol  1 drop Both Eyes QID   . amiodarone 30 mg/hr (07/08/15 0804)  . dextrose 5 % and 0.45% NaCl     OBJECTIVE  Filed Vitals:   07/08/15 0000 07/08/15 0400 07/08/15 0650 07/08/15 0839  BP: 177/69 184/75 170/54   Pulse: 82 74 74   Temp:  98.4 F (36.9 C)    TempSrc:  Oral    Resp: 17 16 17    Height:      Weight:  124 lb 14.4 oz (56.654 kg)    SpO2: 100% 99% 98%  100%    Intake/Output Summary (Last 24 hours) at 07/08/15 1036 Last data filed at 07/08/15 1028  Gross per 24 hour  Intake 2529.34 ml  Output   1375 ml  Net 1154.34 ml   Filed Weights   07/06/15 0610 07/07/15 0343 07/08/15 0400  Weight: 133 lb 2.5 oz (60.4 kg) 123 lb 8 oz (56.019 kg) 124 lb 14.4 oz (56.654 kg)    PHYSICAL EXAM  General: Pleasant, NAD. Neuro: Alert and oriented X 3. Moves all extremities spontaneously. Psych: Normal affect. HEENT:  Normal  Neck: Supple without bruits or JVD. Lungs:  Resp regular and unlabored, Lungs- slightly diminished in the lower lobes. Heart: RRR no s3, s4, or murmurs. Abdomen: Soft, non-tender, non-distended, BS + x 4.  Extremities: No clubbing, cyanosis or edema. DP/PT/Radials 2+ and equal bilaterally. Psych: Normal   Accessory Clinical Findings  CBC  Recent Labs  07/05/15 1709 07/07/15 0540 07/08/15 0353  WBC 7.1 7.0 17.0*  NEUTROABS 4.7  --   --   HGB 12.5 12.5 12.0  HCT 35.6* 35.4* 35.9*  MCV 89.4 90.5 91.1  PLT 268 248 [id number]   Basic Metabolic Panel  Recent Labs  07/07/15 0540 07/08/15 0851  NA 140 139  K 4.2 3.7  CL 106 106  CO2 16* 17*  GLUCOSE 227* 249*  BUN 38* 33*  CREATININE 1.86* 1.34*  CALCIUM 9.1 9.6   Liver Function Tests  Recent Labs  07/05/15 1709  AST 24  ALT 24  ALKPHOS 63  BILITOT 0.9  PROT 7.1  ALBUMIN 3.8   Cardiac Enzymes  Recent Labs  07/07/15 1308 07/07/15 1831 07/07/15 2244  TROPONINI 0.40* 0.41* 0.31*   Thyroid Function Tests  Recent Labs  07/06/15 1501  TSH 0.029*    TELE SR occ. PVC    ECG  No new EKG, EKG last night no acute changes  Echocardiogram 01/14/2015  LV EF: 55% - 60%  ------------------------------------------------------------------- Indications: CAD of native vessels 414.01.  ------------------------------------------------------------------- History: PMH: Chest pain. Atrial fibrillation. Stroke. Risk factors:  Hypertension. Dyslipidemia.  ------------------------------------------------------------------- Study Conclusions  - Left ventricle: The cavity size was normal. Systolic function was  normal. The estimated ejection fraction was in the range of 55%  to 60%. Wall motion was normal; there were no regional wall  motion abnormalities. - Aortic valve: There was mild to moderate regurgitation.  Regurgitation pressure half-time: 365 ms. - Left atrium: The atrium was severely dilated. - Right ventricle: The cavity size was normal. Wall thickness was  normal. Systolic function was normal. - Tricuspid valve: There was mild regurgitation. - Pulmonary arteries: PA peak pressure: 42 mm Hg (S). - Inferior vena cava: The vessel was normal in size. The  respirophasic diameter changes were in the normal range (>= 50%),  consistent with normal central venous pressure.    Radiology/Studies  Ct Abdomen Pelvis Wo Contrast  07/05/2015  CLINICAL DATA:  Bloating, difficulty swallowing, history small-bowel obstruction, ventral hernia, GERD, stomach ulcers, hypertension, type II diabetes mellitus, COPD, asthma EXAM: CT ABDOMEN AND PELVIS WITHOUT CONTRAST TECHNIQUE: Multidetector CT imaging of the abdomen and pelvis was performed following the standard protocol without IV contrast. Sagittal and coronal MPR images reconstructed from axial data set. Patient drank dilute oral contrast for exam. COMPARISON:  01/10/2015 FINDINGS: Lung bases clear. Multiple low-attenuation hepatic foci likely cysts, unchanged. Gallbladder surgically absent. Remainder of liver, spleen, pancreas, kidneys, and adrenal glands normal. Extensive atherosclerotic calcifications. Questionable mild wall thickening of distal esophagus. Increased thickening of the gastric wall versus the previous exam, nonspecific. Small ventral hernia containing nonobstructed bowel loop. Scattered colonic diverticulosis without evidence of diverticulitis or  bowel obstruction. Bladder, ureters, uterus and adnexa normal appearance. Appendix not visualized. No mass, adenopathy, free air or free fluid. Bones demineralized. IMPRESSION: No definite acute intra abdominal abnormalities. Questionable mild wall thickening of the gastric wall versus artifact from underdistention ; consider followup endoscopy or upper GI assessment if patient has persistent symptoms. Electronically Signed   By: Lavonia Dana M.D.   On: 07/05/2015 19:53   Dg Chest 1 View  07/05/2015  CLINICAL DATA:  Dysphagia EXAM: CHEST 1 VIEW COMPARISON:  06/20/2015 FINDINGS: There is unchanged mild cardiomegaly. The lungs are clear. No large effusions. Normal pulmonary vasculature. Unremarkable hilar and mediastinal contours, unchanged. IMPRESSION: Cardiomegaly, unchanged.  No acute cardiopulmonary findings. Electronically Signed   By: Andreas Newport M.D.   On: 07/05/2015 22:29   Dg Chest 2 View  06/20/2015  CLINICAL DATA:  Shortness of breath and cough EXAM: CHEST  2 VIEW COMPARISON:  06/20/2015, 1625 hours FINDINGS: Cardiac shadow is again mildly enlarged. Lungs are well aerated bilaterally. No acute bony abnormality is seen. Stable compression deformity is noted in the lower thoracic spine. IMPRESSION: No acute  abnormality noted.  No change from 2 hours previous Electronically Signed   By: Inez Catalina M.D.   On: 06/20/2015 18:42   Dg Chest 2 View  06/20/2015  CLINICAL DATA:  Pt here with cough that started last week, hx of bronchitis last year EXAM: CHEST  2 VIEW COMPARISON:  01/27/2015 FINDINGS: The heart is enlarged. No focal consolidations or pleural effusions. Stable elevation of right hemidiaphragm. Surgical clips are noted in the upper abdomen. IMPRESSION: Stable cardiomegaly. Electronically Signed   By: Nolon Nations M.D.   On: 06/20/2015 17:44   Ct Chest Wo Contrast  07/07/2015  CLINICAL DATA:  Shortness of breath the last few days. Unable to have contrast due to renal function Hx: HTN,  Stroke, COPD, Diabetes EXAM: CT CHEST WITHOUT CONTRAST TECHNIQUE: Multidetector CT imaging of the chest was performed following the standard protocol without IV contrast. COMPARISON:  CT abdomen 01/10/2015 and earlier studies FINDINGS: Mediastinum/Lymph Nodes: Coarse coronary and aortic calcifications. No hilar or mediastinal adenopathy, sensitivity decreased without IV contrast. No pericardial effusion. Lungs/Pleura: Lungs clear.  No pleural effusion.  No pneumothorax. Upper abdomen: Cholecystectomy clips. Stable low-attenuation liver lesions since previous CT abdomen 05/21/2014 probably cysts. Musculoskeletal: T11 compression fracture deformity with at least 50% loss of height anteriorly, stable 05/21/2014. No retropulsion. No posterior element involvement. No new fracture. Sternum intact. IMPRESSION: 1. No acute abnormality. 2. Atherosclerosis, including aortic and coronary artery disease. Please note that although the presence of coronary artery calcium documents the presence of coronary artery disease, the severity of this disease and any potential stenosis cannot be assessed on this non-gated CT examination. Assessment for potential risk factor modification, dietary therapy or pharmacologic therapy may be warranted, if clinically indicated. 3. Old L2-T11 compression deformity. Electronically Signed   By: Lucrezia Europe M.D.   On: 07/07/2015 13:07   Nm Pulmonary Perf And Vent  07/07/2015  CLINICAL DATA:  Short of breath EXAM: NUCLEAR MEDICINE VENTILATION - PERFUSION LUNG SCAN TECHNIQUE: Ventilation images were obtained in multiple projections using inhaled aerosol Tc-62m DTPA. Perfusion images were obtained in multiple projections after intravenous injection of Tc-68m MAA. RADIOPHARMACEUTICALS:  29.5 mCi Technetium-49m DTPA aerosol inhalation and 4.2 mCi Technetium-85m MAA IV COMPARISON:  Chest x-ray 07/06/2015 FINDINGS: Ventilation: No focal ventilation defect. Perfusion: No wedge shaped peripheral perfusion  defects to suggest acute pulmonary embolism. IMPRESSION: Normal ventilation perfusion scan. Very low probability for pulmonary embolism. Electronically Signed   By: Franchot Gallo M.D.   On: 07/07/2015 12:57   Dg Chest Port 1 View  07/06/2015  CLINICAL DATA:  Dyspnea EXAM: PORTABLE CHEST 1 VIEW COMPARISON:  07/05/2015 FINDINGS: Cardiomediastinal silhouette is stable. No segmental infiltrate or pleural effusion. Central mild vascular congestion without convincing pulmonary edema. Elevation of the right hemidiaphragm again noted. IMPRESSION: No segmental infiltrate or pleural effusion. Central mild vascular congestion without convincing pulmonary edema. Elevation of the right hemidiaphragm again noted. Electronically Signed   By: Lahoma Crocker M.D.   On: 07/06/2015 09:58    ASSESSMENT AND PLAN  1. Paroxysmal atrial flutter with RVR  - Pt back in SR at this time.  -Amiodarone Drip Dc'ed and switched to 400mg  PO.   2. Chest pain with elevated trop  - VQ scan completed 07/07/15- No PE noted on findings.  - Heparin Dc'ed    -Trops trending down 0.41>> 0.31  - Waiting on Echo results to determine POC  3. Dysphagia:   - EGD has been rescheduled for this afternoon   - Heparin Drip  has been stopped for procedure  4. Mild fluid overload in the setting aflutter with RVR.   - Cr improved 1.86>>1.34   - Lungs slightly diminished in the lower lobes, but does not appear in distress at this time   5. Elevated WBC  - Pt has received multiple doses of steroids, could be the cause of elevation?  6. HTN  -Bp's remain elevated throughout the last night  -Increase Lopressor to 5mg  BID for better control     Signed, Reino Bellis NP-C

## 2015-07-08 NOTE — Op Note (Signed)
[facility] Patient Name: Shannon [name] Procedure Date : 07/08/2015 MRN: [id number] Attending MD: Shannon [name] , MD Date of Birth: [birth year] CSN: [id number] Age: 82 Admit Type: Inpatient Procedure:                Upper GI endoscopy Indications:              Dysphagia, Odynophagia Providers:                Shannon Cooter L. Myrtie Neither, MD, Shannon Monday, RN, Shannon [name], Technician, Shannon [name], Technician Referring MD:              Medicines:                Midazolam 1 mg IV, Cetacaine spray Complications:            No immediate complications. Estimated Blood Loss:     Estimated blood loss: none. Procedure:                Pre-Anesthesia Assessment:                           - Prior to the procedure, a History and Physical                            was performed, and patient medications and                            allergies were reviewed. The patient's tolerance of                            previous anesthesia was also reviewed. The risks                            and benefits of the procedure and the sedation                            options and risks were discussed with the patient.                            All questions were answered, and informed consent                            was obtained. Prior Anticoagulants: The patient has                            taken heparin, last dose was 6 hours prior to                            procedure. ASA Grade Assessment: III - A patient                            with severe systemic disease. After reviewing the  risks and benefits, the patient was deemed in                            satisfactory condition to undergo the procedure.                           After obtaining informed consent, the endoscope was                            passed under direct vision. Throughout the                            procedure, the patient's blood pressure, pulse, and         oxygen saturations were monitored continuously. The                            [id number] ([id number]) scope was introduced through the                            mouth, and advanced to the second part of duodenum.                            The upper GI endoscopy was accomplished without                            difficulty. The patient tolerated the procedure                            well. Scope In: Scope Out: Findings:      Diffuse candidiasis was found in the entire esophagus.      A benign-appearing, intrinsic moderate stenosis was found at the       pylorus. This was traversed with moderate resistance.      The examined duodenum was normal. Impression:               - Monilial esophagitis.                           - Gastric stenosis was found at the pylorus.                           - Normal examined duodenum.                           - No specimens collected. Moderate Sedation:      Moderate (conscious) sedation was administered by the endoscopy nurse       and supervised by the endoscopist. The following parameters were       monitored: oxygen saturation, heart rate, blood pressure, respiratory       rate, EKG, adequacy of pulmonary ventilation, and response to care.       Total physician intraservice time was 7 minutes. Recommendation:           - Pureed diet.                           -  Resume heparin at prior dose today.                           - Diflucan (fluconazole) 100 mg PO daily for 21                            days. Procedure Code(s):        --- Professional ---                           [phone], Esophagogastroduodenoscopy, flexible,                            transoral; diagnostic, including collection of                            specimen(s) by brushing or washing, when performed                            (separate procedure) Diagnosis Code(s):        --- Professional ---                           B37.81, Candidal esophagitis                            K31.1, Adult hypertrophic pyloric stenosis                           R13.10, Dysphagia, unspecified CPT copyright 2016 [employer]. All rights reserved. The codes documented in this report are preliminary and upon coder review may  be revised to meet current compliance requirements. Shannon Madlock L. Myrtie Neither, MD 07/08/2015 4:42:50 PM This report has been signed electronically. Number of Addenda: 0

## 2015-07-08 NOTE — Consult Note (Signed)
[facility] CM Inpatient Consult   07/08/2015  Shannon [name] [birth year] [id number] Patient screened for [facility] Care Management services.  Patient is eligible for services.  Patient is currently resting from a procedure earlier  Spoke with inpatient RNCM regarding patient's eligibility. Please place a consult for [facility] if community needs are identified.  For questions, please contact: Charlesetta Shanks, RN BSN CCM [facility]  [phone] business mobile phone Toll free office [phone]

## 2015-07-08 NOTE — Progress Notes (Signed)
  Echocardiogram 2D Echocardiogram has been performed.  Jennette Dubin 07/08/2015, 11:55 AM

## 2015-07-08 NOTE — Progress Notes (Signed)
CRITICAL VALUE ALERT  Critical value received:  Lactic acid 3.5  Date of notification:  07/08/15  Time of notification:  [id number]  Critical value read back:Yes.    Nurse who received alert:  Hubert Azure  MD notified (1st page):  Dr. Candiss Norse  Time of first page:  1500  MD notified (2nd page):  Time of second page:  Responding MD:  Dr. Candiss Norse  Time MD responded:  [place], Harlow Mares

## 2015-07-08 NOTE — Progress Notes (Signed)
Inpatient Diabetes Program Recommendations  AACE/ADA: New Consensus Statement on Inpatient Glycemic Control (2015)  Target Ranges:  Prepandial:   less than 140 mg/dL      Peak postprandial:   less than 180 mg/dL (1-2 hours)      Critically ill patients:  140 - 180 mg/dL   Results for Shannon [name], Shannon [name] (MRN [id number]) as of 07/08/2015 08:18  Ref. Range 07/07/2015 07:59 07/07/2015 11:40 07/07/2015 16:45 07/07/2015 20:31 07/08/2015 07:28  Glucose-Capillary Latest Ref Range: 65-99 mg/dL 212 (H) 199 (H) 271 (H) 336 (H) 235 (H)   Review of Glycemic Control  Diabetes history: DM 2 Outpatient Diabetes medications: None Current orders for Inpatient glycemic control: Novolog Sensitive + HS scale  Inpatient Diabetes Program Recommendations:   Glucose elevated in the 200's since patient is on IV Solumedrol 80 mg Q 6hrs. If appropriate, please increase correction scale to Novolog Moderate.   Thanks,  Tama Headings RN, MSN, [facility] Inpatient Diabetes Coordinator Team Pager [phone] (8a-5p)

## 2015-07-08 NOTE — Progress Notes (Signed)
Daily Rounding Note  07/08/2015, 8:20 AM  LOS: 3 days   SUBJECTIVE:       Breathing nearing baseline.  Chest pressure subsiding.  Spitting sputum into cup but able to swallow purees.  OBJECTIVE:         Vital signs in last 24 hours:    Temp:  [98.4 F (36.9 C)-98.6 F (37 C)] 98.4 F (36.9 C) (04/06 0400) Pulse Rate:  [70-111] 74 (04/06 0650) Resp:  [12-37] 17 (04/06 0650) BP: (163-188)/(54-76) 170/54 mmHg (04/06 0650) SpO2:  [98 %-100 %] 98 % (04/06 0650) Weight:  [56.654 kg (124 lb 14.4 oz)] 56.654 kg (124 lb 14.4 oz) (04/06 0400) Last BM Date: 07/08/15 Filed Weights   07/06/15 0610 07/07/15 0343 07/08/15 0400  Weight: 60.4 kg (133 lb 2.5 oz) 56.019 kg (123 lb 8 oz) 56.654 kg (124 lb 14.4 oz)   General: pleasant, less anxious   Heart: RRR Chest: clear bil.   Abdomen: soft, NT, ND, thin.  Active BS  Extremities: no CCE Neuro/Psych:  Pleasant, cooperative, appropriate, no gross weakness or deficits  Intake/Output from previous day: 04/05 0701 - 04/06 0700 In: 2529.3 [P.O.:600; I.V.:1929.3] Out: 1050 [Urine:1050]  Intake/Output this shift: Total I/O In: -  Out: 175 [Urine:175]  Lab Results:  Recent Labs  07/05/15 1709 07/07/15 0540 07/08/15 0353  WBC 7.1 7.0 17.0*  HGB 12.5 12.5 12.0  HCT 35.6* 35.4* 35.9*  PLT 268 248 208   BMET  Recent Labs  07/05/15 1709 07/06/15 0411 07/07/15 0540  NA 138 139 140  K 3.9 4.0 4.2  CL 105 107 106  CO2 20* 21* 16*  GLUCOSE 101* 100* 227*  BUN 37* 31* 38*  CREATININE 1.62* 1.49* 1.86*  CALCIUM 10.1 9.1 9.1   LFT  Recent Labs  07/05/15 1709  PROT 7.1  ALBUMIN 3.8  AST 24  ALT 24  ALKPHOS 63  BILITOT 0.9  BILIDIR 0.2  IBILI 0.7   PT/INR No results for input(s): LABPROT, INR in the last 72 hours. Hepatitis Panel No results for input(s): HEPBSAG, HCVAB, HEPAIGM, HEPBIGM in the last 72 hours.  Studies/Results: Ct Chest Wo Contrast  07/07/2015   CLINICAL DATA:  Shortness of breath the last few days. Unable to have contrast due to renal function Hx: HTN, Stroke, COPD, Diabetes EXAM: CT CHEST WITHOUT CONTRAST TECHNIQUE: Multidetector CT imaging of the chest was performed following the standard protocol without IV contrast. COMPARISON:  CT abdomen 01/10/2015 and earlier studies FINDINGS: Mediastinum/Lymph Nodes: Coarse coronary and aortic calcifications. No hilar or mediastinal adenopathy, sensitivity decreased without IV contrast. No pericardial effusion. Lungs/Pleura: Lungs clear.  No pleural effusion.  No pneumothorax. Upper abdomen: Cholecystectomy clips. Stable low-attenuation liver lesions since previous CT abdomen 05/21/2014 probably cysts. Musculoskeletal: T11 compression fracture deformity with at least 50% loss of height anteriorly, stable 05/21/2014. No retropulsion. No posterior element involvement. No new fracture. Sternum intact. IMPRESSION: 1. No acute abnormality. 2. Atherosclerosis, including aortic and coronary artery disease. Please note that although the presence of coronary artery calcium documents the presence of coronary artery disease, the severity of this disease and any potential stenosis cannot be assessed on this non-gated CT examination. Assessment for potential risk factor modification, dietary therapy or pharmacologic therapy may be warranted, if clinically indicated. 3. Old L2-T11 compression deformity. Electronically Signed   By: Lucrezia Europe M.D.   On: 07/07/2015 13:07   Nm Pulmonary Perf And Vent  07/07/2015  CLINICAL DATA:  Short of breath EXAM: NUCLEAR MEDICINE VENTILATION - PERFUSION LUNG SCAN TECHNIQUE: Ventilation images were obtained in multiple projections using inhaled aerosol Tc-44m DTPA. Perfusion images were obtained in multiple projections after intravenous injection of Tc-44m MAA. RADIOPHARMACEUTICALS:  29.5 mCi Technetium-63m DTPA aerosol inhalation and 4.2 mCi Technetium-49m MAA IV COMPARISON:  Chest x-ray  07/06/2015 FINDINGS: Ventilation: No focal ventilation defect. Perfusion: No wedge shaped peripheral perfusion defects to suggest acute pulmonary embolism. IMPRESSION: Normal ventilation perfusion scan. Very low probability for pulmonary embolism. Electronically Signed   By: Franchot Gallo M.D.   On: 07/07/2015 12:57   Dg Chest Port 1 View  07/06/2015  CLINICAL DATA:  Dyspnea EXAM: PORTABLE CHEST 1 VIEW COMPARISON:  07/05/2015 FINDINGS: Cardiomediastinal silhouette is stable. No segmental infiltrate or pleural effusion. Central mild vascular congestion without convincing pulmonary edema. Elevation of the right hemidiaphragm again noted. IMPRESSION: No segmental infiltrate or pleural effusion. Central mild vascular congestion without convincing pulmonary edema. Elevation of the right hemidiaphragm again noted. Electronically Signed   By: Lahoma Crocker M.D.   On: 07/06/2015 09:58   Scheduled Meds: . amiodarone  400 mg Oral Daily  . cycloSPORINE  1 drop Both Eyes BID  . fluticasone  2 spray Each Nare Daily  . insulin aspart  0-5 Units Subcutaneous QHS  . insulin aspart  0-9 Units Subcutaneous TID WC  . ipratropium-albuterol  3 mL Nebulization BID  . latanoprost  1 drop Both Eyes QHS  . methylPREDNISolone (SOLU-MEDROL) injection  80 mg Intravenous Q6H  . metoprolol  2.5 mg Intravenous Q12H  . mometasone-formoterol  2 puff Inhalation BID  . nitroGLYCERIN  0.5 inch Topical 4 times per day  . pantoprazole  40 mg Oral BID  . polyvinyl alcohol  1 drop Both Eyes QID   Continuous Infusions: . amiodarone 30 mg/hr (07/08/15 0804)  . heparin 700 Units/hr (07/07/15 2224)   PRN Meds:.albuterol, hydrALAZINE, HYDROmorphone (DILAUDID) injection, nitroGLYCERIN, technetium TC 13M diethylenetriame-pentaacetic acid  ASSESMENT:   * Dysphagia in pt with hx of gastrojejunostomy, PUD and anastomotic stricture, GOO, gastric bezoars. Known small paraesophageal hernia. Suspect esophageal dysmotility. No hx of  esophageal strictures. NPO  * New onset a fib/flutter, Cardizem drip to start. Rates still into 1teens. She has not been started on heparin, Lovenox... Elevated Troponin and chest pressure, felt to be demand ischemia, type 2 STEMI.   * Hyperthyroidism. Note TSH low at 0.029, may be contributing to her tachycardia. Levothyroxine discontinued yesterday.   * Respiratory distress. Improved. O2 sats good    *  AKI.     PLAN   *  EGD 11AM today.  Pt has not had po this Am    Shannon [name]  07/08/2015, 8:20 AM Pager: [phone]

## 2015-07-08 NOTE — Interval H&P Note (Signed)
History and Physical Interval Note:  07/08/2015 4:18 PM  Shannon [name]  has presented today for surgery, with the diagnosis of dysphagia  The various methods of treatment have been discussed with the patient and family. After consideration of risks, benefits and other options for treatment, the patient has consented to  Procedure(s): ESOPHAGOGASTRODUODENOSCOPY (EGD) (N/A) BALLOON DILATION (N/A) as a surgical intervention .  The patient's history has been reviewed, patient examined, no change in status, stable for surgery.  I have reviewed the patient's chart and labs.  Questions were answered to the patient's satisfaction.     Nelida Meuse III

## 2015-07-08 NOTE — Progress Notes (Signed)
ANTICOAGULATION CONSULT NOTE - Follow up [place] for heparin Indication: chest pain/ACS  No Known Allergies  Patient Measurements: Height: 5\' 6"  (167.6 cm) Weight: 124 lb 14.4 oz (56.654 kg) IBW/kg (Calculated) : 59.3 Heparin Dosing Weight: 56 kg  Vital Signs: Temp: 98.4 F (36.9 C) (04/06 0400) Temp Source: Oral (04/06 0400) BP: 170/54 mmHg (04/06 0650) Pulse Rate: 74 (04/06 0650)  Labs:  Recent Labs  07/05/15 1709 07/06/15 0411  07/07/15 0540 07/07/15 1308 07/07/15 1831 07/07/15 2049 07/07/15 2244 07/08/15 0353  HGB 12.5  --   --  12.5  --   --   --   --  12.0  HCT 35.6*  --   --  35.4*  --   --   --   --  35.9*  PLT 268  --   --  248  --   --   --   --  208  HEPARINUNFRC  --   --   --   --   --   --  0.30  --  0.54  CREATININE 1.62* 1.49*  --  1.86*  --   --   --   --   --   TROPONINI <0.03  --   < >  --  0.40* 0.41*  --  0.31*  --   < > = values in this interval not displayed.  Estimated Creatinine Clearance: 18 mL/min (by C-G formula based on Cr of 1.86).   Medical History: Past Medical History  Diagnosis Date  . Pneumonia   . Hyperlipidemia   . Hypertension   . Small bowel obstruction ([place])     "I don't know how many" (01/11/2015)  . Ventral hernia with bowel obstruction   . Asthma   . Hyperlipidemia   . Stroke ([place])     "light one"  . COPD (chronic obstructive pulmonary disease) ([place])   . Hypothyroidism   . Type II diabetes mellitus ([place])   . Anemia     previous blood transfusions  . GERD (gastroesophageal reflux disease)   . History of stomach ulcers   . Arthritis     "all over"  . Renal insufficiency   . Depression     "light case"  . SIADH (syndrome of inappropriate ADH production) ([place])     [name] 01/10/2015    Medications:  Prescriptions prior to admission  Medication Sig Dispense Refill Last Dose  . acetaminophen (TYLENOL) 325 MG tablet Take 325 mg by mouth every 6 (six) hours as needed for mild pain or fever.     07/04/2015 at Unknown time  . ADVAIR DISKUS 250-50 MCG/DOSE AEPB INHALE 1 PUFF INTO THE LUNGS 2 (TWO) TIMES DAILY AS NEEDED FOR SHORTNESS OF BREATH  3 07/04/2015 at Unknown time  . albuterol (PROVENTIL HFA;VENTOLIN HFA) 108 (90 Base) MCG/ACT inhaler Inhale 2 puffs into the lungs every 6 (six) hours as needed for wheezing or shortness of breath. 1 Inhaler 2 not used  . amLODipine (NORVASC) 10 MG tablet TAKE 1 TABLET BY MOUTH DAILY FOR HYPERTENSION 30 tablet 3 07/04/2015 at Unknown time  . aspirin EC 81 MG tablet Take 81 mg by mouth every morning.   07/05/2015 at Unknown time  . atorvastatin (LIPITOR) 10 MG tablet TAKE 1 TABLET BY MOUTH EVERY DAY (Patient taking differently: TAKE 1 TABLET BY MOUTH EVERY DAY IN MORNING) 30 tablet 5 07/04/2015 at Unknown time  . b complex vitamins tablet Take 1 tablet by mouth daily.  07/04/2015 at Unknown time  . calcium-vitamin D (OSCAL WITH D) 500-200 MG-UNIT per tablet Take 1 tablet by mouth daily with breakfast.    07/04/2015 at Unknown time  . chlorhexidine (PERIDEX) 0.12 % solution 15 mLs by Mouth Rinse route 2 (two) times daily. (Patient taking differently: 15 mLs by Mouth Rinse route every evening. ) 120 mL 0 07/04/2015 at Unknown time  . cloNIDine (CATAPRES) 0.1 MG tablet Take 1 tablet (0.1 mg total) by mouth 2 (two) times daily. 60 tablet 11 07/05/2015 at am  . [employer] GAS RELIEF 80 MG chewable tablet CHEW 1 TABLET BY MOUTH 4 TIMES A DAY AS NEEDED FOR FLATULENCE 30 tablet 0 07/04/2015 at Unknown time  . diclofenac sodium (VOLTAREN) 1 % GEL Apply 2 g topically daily as needed (for pain).    07/05/2015 at Unknown time  . feeding supplement (BOOST / RESOURCE BREEZE) LIQD Take 1 Container by mouth 3 (three) times daily between meals. 90 Container 0 07/05/2015 at Unknown time  . fluticasone (FLONASE) 50 MCG/ACT nasal spray Place 2 sprays into both nostrils daily. 16 g 3 07/05/2015 at Unknown time  . gabapentin (NEURONTIN) 300 MG capsule TAKE 1 CAPSULE (300 MG TOTAL) BY MOUTH 3 (THREE) TIMES  DAILY. 90 capsule 3 07/05/2015 at 1 dose  . guaiFENesin (MUCINEX) 600 MG 12 hr tablet Take 1 tablet (600 mg total) by mouth 2 (two) times daily. (Patient taking differently: Take 600 mg by mouth 2 (two) times daily as needed for cough or to loosen phlegm. ) 14 tablet 0 07/04/2015 at Unknown time  . hydrALAZINE (APRESOLINE) 100 MG tablet TAKE 1 TABLET (100 MG TOTAL) BY MOUTH 3 (THREE) TIMES DAILY. 90 tablet 3 07/05/2015 at 1 dose  . isosorbide mononitrate (IMDUR) 30 MG 24 hr tablet Take one tablet by mouth once daily. If Chest Pains occur increase to two tablets daily. (Patient taking differently: Take 30 mg by mouth daily. ) 90 tablet 3 07/05/2015 at Unknown time  . lactulose (CHRONULAC) 10 GM/15ML solution Take 15 mLs (10 g total) by mouth daily as needed for mild constipation. 240 mL 0 Past Month at Unknown time  . latanoprost (XALATAN) 0.005 % ophthalmic solution Place 1 drop into both eyes at bedtime. 2.5 mL 12 07/04/2015 at Unknown time  . levothyroxine (SYNTHROID, LEVOTHROID) 200 MCG tablet Take one tablet by mouth once daily 30 minutes before breakfast for thyroid (Patient taking differently: Take 200 mcg by mouth daily before breakfast. Take one tablet by mouth once daily 30 minutes before breakfast for thyroid) 30 tablet 3 07/05/2015 at Unknown time  . metoprolol succinate (TOPROL-XL) 25 MG 24 hr tablet TAKE 1 TABLET (25 MG TOTAL) BY MOUTH DAILY. 30 tablet 3 07/05/2015 at 0900  . Multiple Vitamins-Minerals (MULTIVITAMIN WITH MINERALS) tablet Take 1 tablet by mouth every morning.    07/04/2015 at Unknown time  . pantoprazole (PROTONIX) 40 MG tablet TAKE 1 TABLET BY MOUTH TWICE A DAY 60 tablet 11 07/05/2015 at am  . Polyethyl Glycol-Propyl Glycol 0.4-0.3 % SOLN Place 1 drop into both eyes 4 (four) times daily.    07/05/2015 at Unknown time  . polyethylene glycol powder (GLYCOLAX/MIRALAX) powder Take 17 g by mouth 2 (two) times daily as needed. (Patient taking differently: Take 17 g by mouth 2 (two) times daily. ) 3350  g 1 07/05/2015 at Unknown time  . RESTASIS 0.05 % ophthalmic emulsion USE 1 DROP INTO BOTH EYES TWICE DAILY 60 mL 1 07/05/2015 at am  . sennosides-docusate sodium (SENOKOT-S)  8.6-50 MG tablet Take 1 tablet by mouth 2 (two) times daily.    07/04/2015 at am  . sertraline (ZOLOFT) 100 MG tablet TAKE 1 TABLET BY MOUTH ONCE DAILY FOR DEPRESSION 30 tablet 5 07/05/2015 at Unknown time  . sodium chloride 1 g tablet TAKE 1 TABLET BY MOUTH THREE TIMES DAILY 90 tablet 2 07/05/2015 at 1 dose  . traMADol (ULTRAM) 50 MG tablet Take 50 mg by mouth 2 (two) times daily as needed for moderate pain.    Past Month at Unknown time   Scheduled:  . amiodarone  400 mg Oral Daily  . cycloSPORINE  1 drop Both Eyes BID  . fluticasone  2 spray Each Nare Daily  . insulin aspart  0-5 Units Subcutaneous QHS  . insulin aspart  0-9 Units Subcutaneous TID WC  . ipratropium-albuterol  3 mL Nebulization BID  . latanoprost  1 drop Both Eyes QHS  . methylPREDNISolone (SOLU-MEDROL) injection  80 mg Intravenous Q6H  . metoprolol  2.5 mg Intravenous Q12H  . mometasone-formoterol  2 puff Inhalation BID  . nitroGLYCERIN  0.5 inch Topical 4 times per day  . pantoprazole  40 mg Oral BID  . polyvinyl alcohol  1 drop Both Eyes QID    Assessment: 49 yoF, pharmacy consulted to dose heparin for ACS. Not on anticoagulation PTA. First HL is borderline therapeutic at 0.54.    Goal of Therapy:  Heparin level 0.3-0.7 units/ml Monitor platelets by anticoagulation protocol: Yes   Plan:  Continue heparin gtt at 700 units/hr Check confirmatory HL in 8 hrs Monitor daily HL, CBC, s/s of bleed  Thank you for allowing [place] to participate in this patients care. Jens Som, PharmD Pager: [phone]  07/08/2015 8:38 AM

## 2015-07-08 NOTE — Progress Notes (Signed)
[facility] Progress Note                                                                                                                                                                                                                      Patient Demographics:    Shannon [name], is a 82 y.o. female, DOB - 1923/08/16, YQI:[id number]  Admit date - 07/05/2015   Admitting Physician Hillary Bow, DO  Outpatient Primary MD for the patient is Kirt Boys, DO  LOS - 3  Outpatient Specialists:  Chief Complaint  Patient presents with  . Dysphagia        Subjective:    Shannon [name] today has, No headache, No chest pain, No abdominal pain - No Nausea, No new weakness tingling or numbness, No Cough - + SOB but some better.    Assessment  & Plan :     1. Acute hypoxic respiratory failure. Etiology not entirely clear at all - question CAD related, VQ scan unremarkable, noncontrast CT chest unremarkable, no evidence of frank fluid overload or infiltrate. Seen by pulmonary. Cardiology following. For now continue supportive care with oxygen and nebulizer treatments, trial of low-dose steroids as she had an episode of bronchitis with similar presentation in the past improved with steroids. Discussed with daughter bedsideOn 07/07/2015, discussed with pulmonary on 07/08/2015 Dr. Delton Coombes will evaluate the patient later. ABGs again suggestive of respiratory alkalosis consistent with tachypnea. Cause unclear. We'll try low-dose Risperdal and when necessary Xanax and monitor. Question if hyperthyroidism is causing her symptoms.  2. A. fib RVR. Paroxysmal. [place] vasc 2 score of greater than 3. Cardiology following, currently in sinus, on amiodarone drip and heparin drip. Echo pending. TSH was low. Hold Synthroid. Further management per cardiology.  3. NSTEMI. Likely due to demand ischemia from #1 and 2 above,  on heparin drip, cardiology on board, defer further management to cardiology. Chest pain-free at this time.   4. Iatrogenic hyperthyroidism. We will hold Synthroid, will resume Synthroid in 3-4 days at a much lower dose.  5. Dysphagia with GERD. On PPI. GI following, EGD due on 07/08/2015.  6. Hypertension. As needed hydralazine.  7. History of asthma/COPD. No wheezing, trial of steroids as in #1 above.   Code Status : No CPR & DNI  Family Communication  : daughter  Disposition Plan  : Stay on tele  Barriers For Discharge : SOB  Consults  : Cards, Pulm, GI  Procedures  :   VQ scan stable  CT chest Non Contrast - non acute  TTE  EGD  DVT Prophylaxis  :   Heparin gtt  Lab Results  Component Value Date   PLT 208 07/08/2015    Antibiotics  :     Anti-infectives    None        Objective:   Filed Vitals:   07/08/15 0000 07/08/15 0400 07/08/15 0650 07/08/15 0839  BP: 177/69 184/75 170/54   Pulse: 82 74 74   Temp:  98.4 F (36.9 C)    TempSrc:  Oral    Resp: 17 16 17    Height:      Weight:  56.654 kg (124 lb 14.4 oz)    SpO2: 100% 99% 98% 100%    Wt Readings from Last 3 Encounters:  07/08/15 56.654 kg (124 lb 14.4 oz)  06/20/15 63.504 kg (140 lb)  06/04/15 63.504 kg (140 lb)     Intake/Output Summary (Last 24 hours) at 07/08/15 1146 Last data filed at 07/08/15 1028  Gross per 24 hour  Intake 2529.34 ml  Output   1375 ml  Net 1154.34 ml     Physical Exam  Awake Alert, Oriented X 3, No new F.N deficits, Normal affect [place].AT,PERRAL Supple Neck,No JVD, No cervical lymphadenopathy appriciated.  Symmetrical Chest wall movement, Good air movement bilaterally, CTAB RRR,No Gallops,Rubs or new Murmurs, No Parasternal Heave +ve B.Sounds, Abd Soft, No tenderness, No organomegaly appriciated, No rebound - guarding or rigidity. No Cyanosis, Clubbing or edema, No new Rash or bruise      Data Review:    CBC  Recent Labs Lab 07/05/15 1709  07/07/15 0540 07/08/15 0353  WBC 7.1 7.0 17.0*  HGB 12.5 12.5 12.0  HCT 35.6* 35.4* 35.9*  PLT 268 248 208  MCV 89.4 90.5 91.1  MCH 31.4 32.0 30.5  MCHC 35.1 35.3 33.4  RDW 16.0* 17.0* 17.3*  LYMPHSABS 1.4  --   --   MONOABS 0.7  --   --   EOSABS 0.2  --   --   BASOSABS 0.0  --   --     Chemistries   Recent Labs Lab 07/05/15 1709 07/06/15 0411 07/07/15 0540 07/08/15 0851  NA 138 139 140 139  K 3.9 4.0 4.2 3.7  CL 105 107 106 106  CO2 20* 21* 16* 17*  GLUCOSE 101* 100* 227* 249*  BUN 37* 31* 38* 33*  CREATININE 1.62* 1.49* 1.86* 1.34*  CALCIUM 10.1 9.1 9.1 9.6  AST 24  --   --   --   ALT 24  --   --   --   ALKPHOS 63  --   --   --   BILITOT 0.9  --   --   --    ------------------------------------------------------------------------------------------------------------------ No results for input(s): CHOL, HDL, LDLCALC, TRIG, CHOLHDL, LDLDIRECT in the last 72 hours.  Lab Results  Component Value Date   HGBA1C 6.4* 04/07/2015   ------------------------------------------------------------------------------------------------------------------  Recent Labs  07/06/15 1501  TSH 0.029*   ------------------------------------------------------------------------------------------------------------------ No results for input(s): VITAMINB12, FOLATE, FERRITIN, TIBC, IRON, RETICCTPCT in the last 72 hours.  Coagulation profile No results for input(s): INR, PROTIME in the last 168 hours.  No results for input(s): DDIMER in the last 72 hours.  Cardiac Enzymes  Recent Labs Lab 07/07/15 1308 07/07/15 1831 07/07/15 2244  TROPONINI 0.40* 0.41* 0.31*   ------------------------------------------------------------------------------------------------------------------    Component Value Date/Time   BNP 238.3* 06/20/2015 1807    Inpatient Medications  Scheduled Meds: . amiodarone  400 mg Oral Daily  . cycloSPORINE  1 drop Both Eyes BID  . fluticasone  2 spray Each  Nare Daily  . insulin aspart  0-5 Units Subcutaneous QHS  . insulin aspart  0-9 Units Subcutaneous TID WC  . ipratropium-albuterol  3 mL Nebulization BID  . latanoprost  1 drop Both Eyes QHS  . methylPREDNISolone (SOLU-MEDROL) injection  80 mg Intravenous Q6H  . metoprolol  5 mg Intravenous Q12H  . mometasone-formoterol  2 puff Inhalation BID  . nitroGLYCERIN  0.5 inch Topical 4 times per day  . pantoprazole  40 mg Oral BID  . polyvinyl alcohol  1 drop Both Eyes QID  . risperiDONE  0.5 mg Oral Daily   Continuous Infusions: . amiodarone 30 mg/hr (07/08/15 0804)  . dextrose 5 % and 0.45% NaCl 75 mL/hr at 07/08/15 1053   PRN Meds:.albuterol, hydrALAZINE, HYDROmorphone (DILAUDID) injection, nitroGLYCERIN, technetium TC 57M diethylenetriame-pentaacetic acid  Micro Results Recent Results (from the past 240 hour(s))  MRSA PCR Screening     Status: None   Collection Time: 07/06/15  6:24 AM  Result Value Ref Range Status   MRSA by PCR NEGATIVE NEGATIVE Final    Comment:        The GeneXpert MRSA Assay (FDA approved for NASAL specimens only), is one component of a comprehensive MRSA colonization surveillance program. It is not intended to diagnose MRSA infection nor to guide or monitor treatment for MRSA infections.     Radiology Reports Ct Abdomen Pelvis Wo Contrast  07/05/2015  CLINICAL DATA:  Bloating, difficulty swallowing, history small-bowel obstruction, ventral hernia, GERD, stomach ulcers, hypertension, type II diabetes mellitus, COPD, asthma EXAM: CT ABDOMEN AND PELVIS WITHOUT CONTRAST TECHNIQUE: Multidetector CT imaging of the abdomen and pelvis was performed following the standard protocol without IV contrast. Sagittal and coronal MPR images reconstructed from axial data set. Patient drank dilute oral contrast for exam. COMPARISON:  01/10/2015 FINDINGS: Lung bases clear. Multiple low-attenuation hepatic foci likely cysts, unchanged. Gallbladder surgically absent. Remainder of  liver, spleen, pancreas, kidneys, and adrenal glands normal. Extensive atherosclerotic calcifications. Questionable mild wall thickening of distal esophagus. Increased thickening of the gastric wall versus the previous exam, nonspecific. Small ventral hernia containing nonobstructed bowel loop. Scattered colonic diverticulosis without evidence of diverticulitis or bowel obstruction. Bladder, ureters, uterus and adnexa normal appearance. Appendix not visualized. No mass, adenopathy, free air or free fluid. Bones demineralized. IMPRESSION: No definite acute intra abdominal abnormalities. Questionable mild wall thickening of the gastric wall versus artifact from underdistention ; consider followup endoscopy or upper GI assessment if patient has persistent symptoms. Electronically Signed   By: Ulyses Southward M.D.   On: 07/05/2015 19:53   Dg Chest 1 View  07/05/2015  CLINICAL DATA:  Dysphagia EXAM: CHEST 1 VIEW COMPARISON:  06/20/2015 FINDINGS: There is unchanged mild cardiomegaly. The lungs are clear. No large effusions. Normal pulmonary vasculature. Unremarkable hilar and mediastinal contours, unchanged. IMPRESSION: Cardiomegaly, unchanged.  No acute cardiopulmonary findings. Electronically Signed   By: Ellery Plunk M.D.   On: 07/05/2015 22:29   Dg Chest 2 View  06/20/2015  CLINICAL DATA:  Shortness of breath and cough EXAM: CHEST  2 VIEW COMPARISON:  06/20/2015, 1625 hours FINDINGS: Cardiac shadow is again mildly enlarged. Lungs are well aerated bilaterally. No acute bony abnormality is seen. Stable compression deformity is noted in the lower thoracic spine. IMPRESSION: No acute abnormality noted.  No change from 2 hours previous Electronically Signed   By: Eulah Pont.D.  On: 06/20/2015 18:42   Dg Chest 2 View  06/20/2015  CLINICAL DATA:  Pt here with cough that started last week, hx of bronchitis last year EXAM: CHEST  2 VIEW COMPARISON:  01/27/2015 FINDINGS: The heart is enlarged. No focal  consolidations or pleural effusions. Stable elevation of right hemidiaphragm. Surgical clips are noted in the upper abdomen. IMPRESSION: Stable cardiomegaly. Electronically Signed   By: Norva Pavlov M.D.   On: 06/20/2015 17:44   Ct Chest Wo Contrast  07/07/2015  CLINICAL DATA:  Shortness of breath the last few days. Unable to have contrast due to renal function Hx: HTN, Stroke, COPD, Diabetes EXAM: CT CHEST WITHOUT CONTRAST TECHNIQUE: Multidetector CT imaging of the chest was performed following the standard protocol without IV contrast. COMPARISON:  CT abdomen 01/10/2015 and earlier studies FINDINGS: Mediastinum/Lymph Nodes: Coarse coronary and aortic calcifications. No hilar or mediastinal adenopathy, sensitivity decreased without IV contrast. No pericardial effusion. Lungs/Pleura: Lungs clear.  No pleural effusion.  No pneumothorax. Upper abdomen: Cholecystectomy clips. Stable low-attenuation liver lesions since previous CT abdomen 05/21/2014 probably cysts. Musculoskeletal: T11 compression fracture deformity with at least 50% loss of height anteriorly, stable 05/21/2014. No retropulsion. No posterior element involvement. No new fracture. Sternum intact. IMPRESSION: 1. No acute abnormality. 2. Atherosclerosis, including aortic and coronary artery disease. Please note that although the presence of coronary artery calcium documents the presence of coronary artery disease, the severity of this disease and any potential stenosis cannot be assessed on this non-gated CT examination. Assessment for potential risk factor modification, dietary therapy or pharmacologic therapy may be warranted, if clinically indicated. 3. Old L2-T11 compression deformity. Electronically Signed   By: Corlis Leak M.D.   On: 07/07/2015 13:07   Nm Pulmonary Perf And Vent  07/07/2015  CLINICAL DATA:  Short of breath EXAM: NUCLEAR MEDICINE VENTILATION - PERFUSION LUNG SCAN TECHNIQUE: Ventilation images were obtained in multiple  projections using inhaled aerosol Tc-32m DTPA. Perfusion images were obtained in multiple projections after intravenous injection of Tc-56m MAA. RADIOPHARMACEUTICALS:  29.5 mCi Technetium-81m DTPA aerosol inhalation and 4.2 mCi Technetium-90m MAA IV COMPARISON:  Chest x-ray 07/06/2015 FINDINGS: Ventilation: No focal ventilation defect. Perfusion: No wedge shaped peripheral perfusion defects to suggest acute pulmonary embolism. IMPRESSION: Normal ventilation perfusion scan. Very low probability for pulmonary embolism. Electronically Signed   By: Marlan Palau M.D.   On: 07/07/2015 12:57   Dg Chest Port 1 View  07/06/2015  CLINICAL DATA:  Dyspnea EXAM: PORTABLE CHEST 1 VIEW COMPARISON:  07/05/2015 FINDINGS: Cardiomediastinal silhouette is stable. No segmental infiltrate or pleural effusion. Central mild vascular congestion without convincing pulmonary edema. Elevation of the right hemidiaphragm again noted. IMPRESSION: No segmental infiltrate or pleural effusion. Central mild vascular congestion without convincing pulmonary edema. Elevation of the right hemidiaphragm again noted. Electronically Signed   By: Natasha Mead M.D.   On: 07/06/2015 09:58    Time Spent in minutes  35   Kellye Mizner K M.D on 07/08/2015 at 11:46 AM  Between 7am to 7pm - Pager - [phone]  After 7pm go to [email or web address] - password [facility]  [facility] -  Office  [phone]

## 2015-07-08 NOTE — H&P (View-Only) (Signed)
Daily Rounding Note  07/07/2015, 8:32 AM  LOS: 2 days   SUBJECTIVE:       C/o pressure in her chest, not SOB.  Hungry.  Feels anxious and worried.  OBJECTIVE:         Vital signs in last 24 hours:    Temp:  [98.2 F (36.8 C)-98.7 F (37.1 C)] 98.3 F (36.8 C) (04/05 0817) Pulse Rate:  [48-151] 70 (04/05 0817) Resp:  [15-24] 18 (04/05 0817) BP: (109-180)/(50-102) 160/70 mmHg (04/05 0817) SpO2:  [85 %-100 %] 100 % (04/05 0817) FiO2 (%):  [28 %] 28 % (04/04 2018) Weight:  [56.019 kg (123 lb 8 oz)] 56.019 kg (123 lb 8 oz) (04/05 0343) Last BM Date: 07/06/15 Filed Weights   07/06/15 0610 07/07/15 0343  Weight: 60.4 kg (133 lb 2.5 oz) 56.019 kg (123 lb 8 oz)   General: thin, a bit anxious but comfortable   Heart: irreg, irreg with rates into low 100s to 1teens.  Chest: clear bil.  No labored breathing or cough Abdomen: soft, NT, active BS  Extremities: no CCE Neuro/Psych:  Anxious, moves all 4s, some tremor in hands  Intake/Output from previous day: 04/04 0701 - 04/05 0700 In: 0  Out: 750 [Urine:750]  Intake/Output this shift:    Lab Results:  Recent Labs  07/05/15 1709 07/07/15 0540  WBC 7.1 7.0  HGB 12.5 12.5  HCT 35.6* 35.4*  PLT 268 248   BMET  Recent Labs  07/05/15 1709 07/06/15 0411 07/07/15 0540  NA 138 139 140  K 3.9 4.0 4.2  CL 105 107 106  CO2 20* 21* 16*  GLUCOSE 101* 100* 227*  BUN 37* 31* 38*  CREATININE 1.62* 1.49* 1.86*  CALCIUM 10.1 9.1 9.1   LFT  Recent Labs  07/05/15 1709  PROT 7.1  ALBUMIN 3.8  AST 24  ALT 24  ALKPHOS 63  BILITOT 0.9  BILIDIR 0.2  IBILI 0.7   PT/INR No results for input(s): LABPROT, INR in the last 72 hours. Hepatitis Panel No results for input(s): HEPBSAG, HCVAB, HEPAIGM, HEPBIGM in the last 72 hours.  Studies/Results: Ct Abdomen Pelvis Wo Contrast  07/05/2015  CLINICAL DATA:  Bloating, difficulty swallowing, history small-bowel  obstruction, ventral hernia, GERD, stomach ulcers, hypertension, type II diabetes mellitus, COPD, asthma EXAM: CT ABDOMEN AND PELVIS WITHOUT CONTRAST TECHNIQUE: Multidetector CT imaging of the abdomen and pelvis was performed following the standard protocol without IV contrast. Sagittal and coronal MPR images reconstructed from axial data set. Patient drank dilute oral contrast for exam. COMPARISON:  01/10/2015 FINDINGS: Lung bases clear. Multiple low-attenuation hepatic foci likely cysts, unchanged. Gallbladder surgically absent. Remainder of liver, spleen, pancreas, kidneys, and adrenal glands normal. Extensive atherosclerotic calcifications. Questionable mild wall thickening of distal esophagus. Increased thickening of the gastric wall versus the previous exam, nonspecific. Small ventral hernia containing nonobstructed bowel loop. Scattered colonic diverticulosis without evidence of diverticulitis or bowel obstruction. Bladder, ureters, uterus and adnexa normal appearance. Appendix not visualized. No mass, adenopathy, free air or free fluid. Bones demineralized. IMPRESSION: No definite acute intra abdominal abnormalities. Questionable mild wall thickening of the gastric wall versus artifact from underdistention ; consider followup endoscopy or upper GI assessment if patient has persistent symptoms. Electronically Signed   By: Lavonia Dana M.D.   On: 07/05/2015 19:53   Dg Chest 1 View  07/05/2015  CLINICAL DATA:  Dysphagia EXAM: CHEST 1 VIEW COMPARISON:  06/20/2015 FINDINGS: There is unchanged mild cardiomegaly.  The lungs are clear. No large effusions. Normal pulmonary vasculature. Unremarkable hilar and mediastinal contours, unchanged. IMPRESSION: Cardiomegaly, unchanged.  No acute cardiopulmonary findings. Electronically Signed   By: Andreas Newport M.D.   On: 07/05/2015 22:29   Dg Chest Port 1 View  07/06/2015  CLINICAL DATA:  Dyspnea EXAM: PORTABLE CHEST 1 VIEW COMPARISON:  07/05/2015 FINDINGS:  Cardiomediastinal silhouette is stable. No segmental infiltrate or pleural effusion. Central mild vascular congestion without convincing pulmonary edema. Elevation of the right hemidiaphragm again noted. IMPRESSION: No segmental infiltrate or pleural effusion. Central mild vascular congestion without convincing pulmonary edema. Elevation of the right hemidiaphragm again noted. Electronically Signed   By: Lahoma Crocker M.D.   On: 07/06/2015 09:58    ASSESMENT:   * Dysphagia in pt with hx of gastrojejunostomy, PUD and anastomotic stricture, GOO, gastric bezoars. Known small paraesophageal hernia. Suspect esophageal dysmotility. No hx of esophageal strictures. NPO  * New onset a fib/flutter, Cardizem drip to start. Rates still into 1teens. She has not been started on heparin, Lovenox... Elevated Troponin and chest pressure, felt to be demand ischemia, type 2 STEMI.     *  Hyperthyroidism.  Note TSH low at 0.029, may be contributing to her tachycardia.  Levothyroxine discontinued yesterday.   * Respiratory distress. Improved.  O2 sats good    PLAN   *  Cancelling EGD for today.  Ok to start D1 diet.     Shannon [name]  07/07/2015, 8:32 AM Pager: [phone]

## 2015-07-09 DIAGNOSIS — B3781 Candidal esophagitis: Principal | ICD-10-CM

## 2015-07-09 DIAGNOSIS — Z7189 Other specified counseling: Secondary | ICD-10-CM

## 2015-07-09 DIAGNOSIS — Z515 Encounter for palliative care: Secondary | ICD-10-CM

## 2015-07-09 LAB — LACTIC ACID, PLASMA
Lactic Acid, Venous: 3.4 mmol/L (ref 0.5–2.0)
Lactic Acid, Venous: 4.7 mmol/L (ref 0.5–2.0)

## 2015-07-09 LAB — BLOOD GAS, ARTERIAL
Acid-Base Excess: 5.8 mmol/L — ABNORMAL HIGH (ref 0.0–2.0)
Bicarbonate: 28.9 mEq/L — ABNORMAL HIGH (ref 20.0–24.0)
Drawn by: 312971
O2 Content: 2 L/min
O2 Saturation: 98.1 %
Patient temperature: 98.6
TCO2: 30 mmol/L (ref 0–100)
pCO2 arterial: 35 mmHg (ref 35.0–45.0)
pH, Arterial: 7.527 — ABNORMAL HIGH (ref 7.350–7.450)
pO2, Arterial: 105 mmHg — ABNORMAL HIGH (ref 80.0–100.0)

## 2015-07-09 LAB — CBC
HCT: 32.6 % — ABNORMAL LOW (ref 36.0–46.0)
Hemoglobin: 10.9 g/dL — ABNORMAL LOW (ref 12.0–15.0)
MCH: 30.1 pg (ref 26.0–34.0)
MCHC: 33.4 g/dL (ref 30.0–36.0)
MCV: 90.1 fL (ref 78.0–100.0)
Platelets: 176 10*3/uL (ref 150–400)
RBC: 3.62 MIL/uL — ABNORMAL LOW (ref 3.87–5.11)
RDW: 17.1 % — ABNORMAL HIGH (ref 11.5–15.5)
WBC: 15 10*3/uL — ABNORMAL HIGH (ref 4.0–10.5)

## 2015-07-09 LAB — GLUCOSE, CAPILLARY
Glucose-Capillary: 128 mg/dL — ABNORMAL HIGH (ref 65–99)
Glucose-Capillary: 199 mg/dL — ABNORMAL HIGH (ref 65–99)
Glucose-Capillary: 128 mg/dL — ABNORMAL HIGH (ref 65–99)
Glucose-Capillary: 162 mg/dL — ABNORMAL HIGH (ref 65–99)

## 2015-07-09 LAB — BASIC METABOLIC PANEL
Anion gap: 12 (ref 5–15)
BUN: 30 mg/dL — ABNORMAL HIGH (ref 6–20)
CO2: 25 mmol/L (ref 22–32)
Calcium: 9 mg/dL (ref 8.9–10.3)
Chloride: 103 mmol/L (ref 101–111)
Creatinine, Ser: 1.29 mg/dL — ABNORMAL HIGH (ref 0.44–1.00)
GFR calc Af Amer: 41 mL/min — ABNORMAL LOW (ref >60)
GFR calc non Af Amer: 35 mL/min — ABNORMAL LOW (ref >60)
Glucose, Bld: 236 mg/dL — ABNORMAL HIGH (ref 65–99)
Potassium: 3.4 mmol/L — ABNORMAL LOW (ref 3.5–5.1)
Sodium: 140 mmol/L (ref 135–145)

## 2015-07-09 MED ORDER — LEVOTHYROXINE SODIUM 100 MCG PO TABS: 100.0000 ug | ORAL_TABLET | Freq: Every day | ORAL | Status: DC

## 2015-07-09 MED ORDER — LACTATED RINGERS IV SOLN
INTRAVENOUS | Status: DC
  Administered 2015-07-09: 10:00:00 via INTRAVENOUS

## 2015-07-09 MED ORDER — RISPERIDONE 1 MG PO TABS
1.0000 mg | ORAL_TABLET | Freq: Every day | ORAL | Status: DC
  Administered 2015-07-10 – 2015-07-11 (×2): 1 mg via ORAL
  Filled 2015-07-09 (×2): qty 1

## 2015-07-09 MED ORDER — LACTATED RINGERS IV SOLN
INTRAVENOUS | Status: DC
  Administered 2015-07-09: 12:00:00 via INTRAVENOUS

## 2015-07-09 MED ORDER — SODIUM CHLORIDE 0.9 % IV BOLUS (SEPSIS)
1000.0000 mL | Freq: Once | INTRAVENOUS | Status: AC
  Administered 2015-07-09: 1000 mL via INTRAVENOUS

## 2015-07-09 MED ORDER — CLONAZEPAM 0.5 MG PO TABS
0.5000 mg | ORAL_TABLET | Freq: Two times a day (BID) | ORAL | Status: DC
  Administered 2015-07-09 – 2015-07-11 (×5): 0.5 mg via ORAL
  Filled 2015-07-09 (×5): qty 1

## 2015-07-09 MED ORDER — POTASSIUM CHLORIDE CRYS ER 20 MEQ PO TBCR
40.0000 meq | EXTENDED_RELEASE_TABLET | Freq: Once | ORAL | Status: AC
  Administered 2015-07-09: 40 meq via ORAL
  Filled 2015-07-09: qty 2

## 2015-07-09 NOTE — Progress Notes (Signed)
Lactic Acid = 4.7. Critical Value Called to Dr. Candiss Norse. No further orders received at this time.

## 2015-07-09 NOTE — Progress Notes (Signed)
[facility] Progress Note                                                                                                                                                                                                                      Patient Demographics:    Shannon [name], is a 82 y.o. female, DOB - [birth year], ACZ:[id number]  Admit date - 07/05/2015   Admitting Physician Hillary Bow, DO  Outpatient Primary MD for the patient is Kirt Boys, DO  LOS - 4  Outpatient Specialists:  Chief Complaint  Patient presents with  . Dysphagia        Subjective:    Shannon [name] today has, No headache, No chest pain, No abdominal pain - No Nausea, No new weakness tingling or numbness, No Cough - + SOB but some better.    Assessment  & Plan :     1. Acute hypoxic respiratory failure. Etiology not entirely clear at all - question CAD related, VQ scan unremarkable, noncontrast CT chest unremarkable, no evidence of frank fluid overload or infiltrate. Seen by pulmonary. Cardiology following. For now continue supportive care with oxygen and nebulizer treatments, Stopped steroids. Discussed with daughter bedside on 07/07/2015, discussed with pulmonary on 07/08/2015 Dr. Delton Coombes will evaluate the patient later. ABGs again suggestive of respiratory alkalosis consistent with tachypnea. Likely tachypnea due to combination of hyperthyroidism, GI discomfort from esophagitis and anxiety. We'll try low-dose Risperdal and when necessary Xanax and monitor.    2. A. fib RVR. Paroxysmal. [place] vasc 2 score of greater than 3. Cardiology following, currently in sinus, on amiodarone drip and heparin drip. Echo pending. TSH was low. Hold Synthroid. Further management per cardiology.  3. NSTEMI. Likely due to demand ischemia from #1 and 2 above, on heparin drip, cardiology on board, defer further management to  cardiology. Chest pain-free at this time.   4. Iatrogenic hyperthyroidism. Synthroid was held for a few days. We'll start at a much lower than home dose for 10/21/2015.  5. Dysphagia with GERD. GI following, he underwent EGD on 07/08/2015 showing candidiasis esophagitis along with pyloric stenosis. Placed on Diflucan and statin swish and swallow. Patient is better. No dysphagia 1 diet. Continue to monitor. Clinically improved. Continue PPI.  6. Hypertension. As needed hydralazine.  7. History of asthma/COPD. No wheezing, trial of steroids as in #1 above.  8. Complex respiratory alkalosis. Due to combination of hyperthyroidism, GI discomfort due to esophagitis along with anxiety, in turn causing decompensated biliary drop in bicarbonate,  mildly elevated lactate due to work of breathing and dehydration from decreased oral intake due to esophagitis. Treat tachypnea with respiratory and low-dose benzodiazepine. ABGs improved. No role of bicarbonate. Discussed with renal Dr. Briant Cedar and pulmonary Dr. Delton Coombes.    Code Status : No CPR & DNI  Family Communication  : daughter  Disposition Plan  : Stay on tele  Barriers For Discharge : SOB  Consults  : Cards, Pulm, GI  Procedures  :   VQ scan stable  CT chest Non Contrast - non acute  TTE   Left ventricle: The cavity size was normal. Wall thickness was increased in a pattern of mild LVH. Systolic function was normal. The estimated ejection fraction was in the range of 60% to 65%.  Doppler parameters are consistent with abnormal left ventricular relaxation (grade 1 diastolic dysfunction). - Aortic valve: There was mild regurgitation. - Mitral valve: There was mild regurgitation. - Left atrium: The atrium was moderately to severely dilated.   EGD - severe candida esophagitis along with pyloric stenosis.    DVT Prophylaxis  :   Heparin gtt  Lab Results  Component Value Date   PLT 176 07/09/2015    Antibiotics  :      Anti-infectives    Start     Dose/Rate Route Frequency Ordered Stop   07/08/15 1800  fluconazole (DIFLUCAN) 40 MG/ML suspension 100 mg     100 mg Oral Daily 07/08/15 1635 07/29/15 0959        Objective:   Filed Vitals:   07/09/15 0443 07/09/15 0551 07/09/15 0749 07/09/15 0841  BP: 146/65     Pulse: 69     Temp: 98.3 F (36.8 C)  98 F (36.7 C)   TempSrc: Oral  Oral   Resp: 18     Height:      Weight:  57.3 kg (126 lb 5.2 oz)    SpO2: 100%   100%    Wt Readings from Last 3 Encounters:  07/09/15 57.3 kg (126 lb 5.2 oz)  06/20/15 63.504 kg (140 lb)  06/04/15 63.504 kg (140 lb)     Intake/Output Summary (Last 24 hours) at 07/09/15 1046 Last data filed at 07/09/15 0750  Gross per 24 hour  Intake 779.16 ml  Output    800 ml  Net -20.84 ml     Physical Exam  Awake Alert, Oriented X 3, No new F.N deficits, Normal affect [place].AT,PERRAL Supple Neck,No JVD, No cervical lymphadenopathy appriciated.  Symmetrical Chest wall movement, Good air movement bilaterally, CTAB RRR,No Gallops,Rubs or new Murmurs, No Parasternal Heave +ve B.Sounds, Abd Soft, No tenderness, No organomegaly appriciated, No rebound - guarding or rigidity. No Cyanosis, Clubbing or edema, No new Rash or bruise      Data Review:    CBC  Recent Labs Lab 07/05/15 1709 07/07/15 0540 07/08/15 0353 07/09/15 0414  WBC 7.1 7.0 17.0* 15.0*  HGB 12.5 12.5 12.0 10.9*  HCT 35.6* 35.4* 35.9* 32.6*  PLT 268 248 208 176  MCV 89.4 90.5 91.1 90.1  MCH 31.4 32.0 30.5 30.1  MCHC 35.1 35.3 33.4 33.4  RDW 16.0* 17.0* 17.3* 17.1*  LYMPHSABS 1.4  --   --   --   MONOABS 0.7  --   --   --   EOSABS 0.2  --   --   --   BASOSABS 0.0  --   --   --     Chemistries   Recent Labs Lab 07/05/15 1709 07/06/15 0411  07/07/15 0540 07/08/15 0851 07/09/15 0414  NA 138 139 140 139 140  K 3.9 4.0 4.2 3.7 3.4*  CL 105 107 106 106 103  CO2 20* 21* 16* 17* 25  GLUCOSE 101* 100* 227* 249* 236*  BUN 37* 31* 38* 33*  30*  CREATININE 1.62* 1.49* 1.86* 1.34* 1.29*  CALCIUM 10.1 9.1 9.1 9.6 9.0  AST 24  --   --   --   --   ALT 24  --   --   --   --   ALKPHOS 63  --   --   --   --   BILITOT 0.9  --   --   --   --    ------------------------------------------------------------------------------------------------------------------ No results for input(s): CHOL, HDL, LDLCALC, TRIG, CHOLHDL, LDLDIRECT in the last 72 hours.  Lab Results  Component Value Date   HGBA1C 6.4* 04/07/2015   ------------------------------------------------------------------------------------------------------------------  Recent Labs  07/06/15 1501  TSH 0.029*   ------------------------------------------------------------------------------------------------------------------ No results for input(s): VITAMINB12, FOLATE, FERRITIN, TIBC, IRON, RETICCTPCT in the last 72 hours.  Coagulation profile No results for input(s): INR, PROTIME in the last 168 hours.  No results for input(s): DDIMER in the last 72 hours.  Cardiac Enzymes  Recent Labs Lab 07/07/15 1308 07/07/15 1831 07/07/15 2244  TROPONINI 0.40* 0.41* 0.31*   ------------------------------------------------------------------------------------------------------------------    Component Value Date/Time   BNP 238.3* 06/20/2015 1807    Inpatient Medications  Scheduled Meds: . amiodarone  200 mg Oral Daily  . clonazePAM  0.5 mg Oral BID  . cycloSPORINE  1 drop Both Eyes BID  . diltiazem  120 mg Oral Daily  . fluconazole  100 mg Oral Daily  . fluticasone  2 spray Each Nare Daily  . insulin aspart  0-5 Units Subcutaneous QHS  . insulin aspart  0-9 Units Subcutaneous TID WC  . ipratropium-albuterol  3 mL Nebulization BID  . latanoprost  1 drop Both Eyes QHS  . metoprolol  5 mg Intravenous Q12H  . mometasone-formoterol  2 puff Inhalation BID  . nitroGLYCERIN  0.5 inch Topical 4 times per day  . nystatin  5 mL Mouth/Throat QID  . polyvinyl alcohol  1 drop  Both Eyes QID  . [START ON 07/10/2015] risperiDONE  1 mg Oral Daily  . sodium chloride  1,000 mL Intravenous Once   Continuous Infusions: . lactated ringers     PRN Meds:.albuterol, hydrALAZINE, HYDROmorphone (DILAUDID) injection, nitroGLYCERIN, technetium TC 69M diethylenetriame-pentaacetic acid  Micro Results Recent Results (from the past 240 hour(s))  MRSA PCR Screening     Status: None   Collection Time: 07/06/15  6:24 AM  Result Value Ref Range Status   MRSA by PCR NEGATIVE NEGATIVE Final    Comment:        The GeneXpert MRSA Assay (FDA approved for NASAL specimens only), is one component of a comprehensive MRSA colonization surveillance program. It is not intended to diagnose MRSA infection nor to guide or monitor treatment for MRSA infections.   Urine culture     Status: None (Preliminary result)   Collection Time: 07/08/15  6:26 PM  Result Value Ref Range Status   Specimen Description URINE, RANDOM  Final   Special Requests NONE  Final   Culture CULTURE REINCUBATED FOR BETTER GROWTH  Final   Report Status PENDING  Incomplete    Radiology Reports Ct Abdomen Pelvis Wo Contrast  07/05/2015  CLINICAL DATA:  Bloating, difficulty swallowing, history small-bowel obstruction, ventral hernia, GERD, stomach ulcers, hypertension, type  II diabetes mellitus, COPD, asthma EXAM: CT ABDOMEN AND PELVIS WITHOUT CONTRAST TECHNIQUE: Multidetector CT imaging of the abdomen and pelvis was performed following the standard protocol without IV contrast. Sagittal and coronal MPR images reconstructed from axial data set. Patient drank dilute oral contrast for exam. COMPARISON:  01/10/2015 FINDINGS: Lung bases clear. Multiple low-attenuation hepatic foci likely cysts, unchanged. Gallbladder surgically absent. Remainder of liver, spleen, pancreas, kidneys, and adrenal glands normal. Extensive atherosclerotic calcifications. Questionable mild wall thickening of distal esophagus. Increased thickening of  the gastric wall versus the previous exam, nonspecific. Small ventral hernia containing nonobstructed bowel loop. Scattered colonic diverticulosis without evidence of diverticulitis or bowel obstruction. Bladder, ureters, uterus and adnexa normal appearance. Appendix not visualized. No mass, adenopathy, free air or free fluid. Bones demineralized. IMPRESSION: No definite acute intra abdominal abnormalities. Questionable mild wall thickening of the gastric wall versus artifact from underdistention ; consider followup endoscopy or upper GI assessment if patient has persistent symptoms. Electronically Signed   By: Ulyses Southward M.D.   On: 07/05/2015 19:53   Dg Chest 1 View  07/05/2015  CLINICAL DATA:  Dysphagia EXAM: CHEST 1 VIEW COMPARISON:  06/20/2015 FINDINGS: There is unchanged mild cardiomegaly. The lungs are clear. No large effusions. Normal pulmonary vasculature. Unremarkable hilar and mediastinal contours, unchanged. IMPRESSION: Cardiomegaly, unchanged.  No acute cardiopulmonary findings. Electronically Signed   By: Ellery Plunk M.D.   On: 07/05/2015 22:29   Dg Chest 2 View  06/20/2015  CLINICAL DATA:  Shortness of breath and cough EXAM: CHEST  2 VIEW COMPARISON:  06/20/2015, 1625 hours FINDINGS: Cardiac shadow is again mildly enlarged. Lungs are well aerated bilaterally. No acute bony abnormality is seen. Stable compression deformity is noted in the lower thoracic spine. IMPRESSION: No acute abnormality noted.  No change from 2 hours previous Electronically Signed   By: Alcide Clever M.D.   On: 06/20/2015 18:42   Dg Chest 2 View  06/20/2015  CLINICAL DATA:  Pt here with cough that started last week, hx of bronchitis last year EXAM: CHEST  2 VIEW COMPARISON:  01/27/2015 FINDINGS: The heart is enlarged. No focal consolidations or pleural effusions. Stable elevation of right hemidiaphragm. Surgical clips are noted in the upper abdomen. IMPRESSION: Stable cardiomegaly. Electronically Signed   By:  Norva Pavlov M.D.   On: 06/20/2015 17:44   Ct Chest Wo Contrast  07/07/2015  CLINICAL DATA:  Shortness of breath the last few days. Unable to have contrast due to renal function Hx: HTN, Stroke, COPD, Diabetes EXAM: CT CHEST WITHOUT CONTRAST TECHNIQUE: Multidetector CT imaging of the chest was performed following the standard protocol without IV contrast. COMPARISON:  CT abdomen 01/10/2015 and earlier studies FINDINGS: Mediastinum/Lymph Nodes: Coarse coronary and aortic calcifications. No hilar or mediastinal adenopathy, sensitivity decreased without IV contrast. No pericardial effusion. Lungs/Pleura: Lungs clear.  No pleural effusion.  No pneumothorax. Upper abdomen: Cholecystectomy clips. Stable low-attenuation liver lesions since previous CT abdomen 05/21/2014 probably cysts. Musculoskeletal: T11 compression fracture deformity with at least 50% loss of height anteriorly, stable 05/21/2014. No retropulsion. No posterior element involvement. No new fracture. Sternum intact. IMPRESSION: 1. No acute abnormality. 2. Atherosclerosis, including aortic and coronary artery disease. Please note that although the presence of coronary artery calcium documents the presence of coronary artery disease, the severity of this disease and any potential stenosis cannot be assessed on this non-gated CT examination. Assessment for potential risk factor modification, dietary therapy or pharmacologic therapy may be warranted, if clinically indicated. 3. Old L2-T11 compression  deformity. Electronically Signed   By: Corlis Leak M.D.   On: 07/07/2015 13:07   Nm Pulmonary Perf And Vent  07/07/2015  CLINICAL DATA:  Short of breath EXAM: NUCLEAR MEDICINE VENTILATION - PERFUSION LUNG SCAN TECHNIQUE: Ventilation images were obtained in multiple projections using inhaled aerosol Tc-27m DTPA. Perfusion images were obtained in multiple projections after intravenous injection of Tc-48m MAA. RADIOPHARMACEUTICALS:  29.5 mCi Technetium-49m DTPA  aerosol inhalation and 4.2 mCi Technetium-42m MAA IV COMPARISON:  Chest x-ray 07/06/2015 FINDINGS: Ventilation: No focal ventilation defect. Perfusion: No wedge shaped peripheral perfusion defects to suggest acute pulmonary embolism. IMPRESSION: Normal ventilation perfusion scan. Very low probability for pulmonary embolism. Electronically Signed   By: Marlan Palau M.D.   On: 07/07/2015 12:57   Dg Chest Port 1 View  07/08/2015  CLINICAL DATA:  Shortness of breath EXAM: PORTABLE CHEST 1 VIEW COMPARISON:  07/07/2015 FINDINGS: The heart size appears normal. There is aortic atherosclerosis. No pleural effusion or edema. No airspace consolidation identified. Osteoarthritis is noted involving both glenohumeral joints. IMPRESSION: 1. No acute cardiopulmonary abnormalities. Electronically Signed   By: Signa Kell M.D.   On: 07/08/2015 18:02   Dg Chest Port 1 View  07/06/2015  CLINICAL DATA:  Dyspnea EXAM: PORTABLE CHEST 1 VIEW COMPARISON:  07/05/2015 FINDINGS: Cardiomediastinal silhouette is stable. No segmental infiltrate or pleural effusion. Central mild vascular congestion without convincing pulmonary edema. Elevation of the right hemidiaphragm again noted. IMPRESSION: No segmental infiltrate or pleural effusion. Central mild vascular congestion without convincing pulmonary edema. Elevation of the right hemidiaphragm again noted. Electronically Signed   By: Natasha Mead M.D.   On: 07/06/2015 09:58    Time Spent in minutes  35   SINGH,PRASHANT K M.D on 07/09/2015 at 10:46 AM  Between 7am to 7pm - Pager - [phone]  After 7pm go to [email or web address] - password [facility]  [facility] -  Office  [phone]

## 2015-07-09 NOTE — Addendum Note (Signed)
Addended by: Rafael Bihari A on: 07/09/2015 03:24 PM   Modules accepted: Orders

## 2015-07-09 NOTE — Plan of Care (Signed)
Problem: Safety: Goal: Ability to remain free from injury will improve Outcome: Progressing Video monitored. Able to utilize call bell for needs.   Problem: Health Behavior/Discharge Planning: Goal: Ability to manage health-related needs will improve Outcome: Progressing Expressed ability to take care of ADL's. Lives with Daughter

## 2015-07-09 NOTE — Evaluation (Signed)
Physical Therapy Evaluation Patient Details Name: Shannon [name] MRN: [id number] DOB: 08-23-1923 Today's Date: 07/09/2015   History of Present Illness  Patient is a 82 y/o female with hx of HTN, HLD, CVA, SBO, COPD, DM and depression presents with difficulty swallowing and during admission went into rapid A flutter. s/p EGD.  Clinical Impression  Patient presents with mild balance deficits and decreased endurance secondary to hospitalization. Tolerated ambulating 250' with rollator. HR up to 130 bpm. Pt close to baseline but mildly unsteady. Encouraged ambulation with RN while in hospital. Will follow acutely to improve strength and mobility so pt can return to PLOF. Will follow.     Follow Up Recommendations No PT follow up;Supervision - Intermittent    Equipment Recommendations  None recommended by PT    Recommendations for Other Services       Precautions / Restrictions Precautions Precautions: Fall Restrictions Weight Bearing Restrictions: No      Mobility  Bed Mobility               General bed mobility comments: Up in chair upon PT arrival.  Transfers Overall transfer level: Needs assistance Equipment used: 4-wheeled walker Transfers: Sit to/from Stand Sit to Stand: Supervision         General transfer comment: Supervision for safety. A few attempts to stand but no assist needed. Low chair.  Ambulation/Gait Ambulation/Gait assistance: Supervision Ambulation Distance (Feet): 250 Feet Assistive device: 4-wheeled walker Gait Pattern/deviations: Step-through pattern;Decreased stride length;Trunk flexed Gait velocity: decreased   General Gait Details: Slow, moslty steady gait. Walks more towards left side within rollator. HR up to 130 bpm.   Stairs            Wheelchair Mobility    Modified Rankin (Stroke Patients Only)       Balance Overall balance assessment: Needs assistance Sitting-balance support: Feet supported;No upper extremity  supported Sitting balance-Leahy Scale: Good     Standing balance support: During functional activity Standing balance-Leahy Scale: Fair                               Pertinent Vitals/Pain Pain Assessment: No/denies pain    Home Living Family/patient expects to be discharged to:: Private residence Living Arrangements: Children Available Help at Discharge: Family;Available 24 hours/day Type of Home: House Home Access: Ramped entrance     Home Layout: One level Home Equipment: Walker - 2 wheels;Walker - 4 wheels;Cane - single point;Shower seat;Hand held shower head;Adaptive equipment;Wheelchair - manual      Prior Function Level of Independence: Independent with assistive device(s)         Comments: USes rollator. Does her own ADLs and makes her bed. Daughter does IADLs.     Hand Dominance   Dominant Hand: Right    Extremity/Trunk Assessment   Upper Extremity Assessment: Defer to OT evaluation           Lower Extremity Assessment: Generalized weakness         Communication   Communication: No difficulties  Cognition Arousal/Alertness: Awake/alert Behavior During Therapy: WFL for tasks assessed/performed Overall Cognitive Status: Within Functional Limits for tasks assessed                      General Comments General comments (skin integrity, edema, etc.): VSS    Exercises        Assessment/Plan    PT Assessment Patient needs continued PT services  PT Diagnosis Difficulty  walking   PT Problem List Decreased strength;Cardiopulmonary status limiting activity;Decreased balance  PT Treatment Interventions Balance training;Gait training;Functional mobility training;Therapeutic activities;Therapeutic exercise;Patient/family education   PT Goals (Current goals can be found in the Care Plan section) Acute Rehab PT Goals Patient Stated Goal: to return home tomorrow PT Goal Formulation: With patient Time For Goal Achievement:  07/23/15 Potential to Achieve Goals: Good    Frequency Min 3X/week   Barriers to discharge        Co-evaluation               End of Session Equipment Utilized During Treatment: Gait belt Activity Tolerance: Patient tolerated treatment well Patient left: in chair;with call bell/phone within reach Nurse Communication: Mobility status         Time: 1354-1411 PT Time Calculation (min) (ACUTE ONLY): 17 min   Charges:   PT Evaluation $PT Eval Moderate Complexity: 1 Procedure     PT G Codes:        Tehani Mersman A Lauriana Denes 07/09/2015, 2:21 PM Mylo Red, PT, DPT [phone]

## 2015-07-09 NOTE — Progress Notes (Signed)
Daily Rounding Note  07/09/2015, 11:49 AM  LOS: 4 days   SUBJECTIVE:       Swallowing much improved.  Back to baseline and she is happy for this  OBJECTIVE:         Vital signs in last 24 hours:    Temp:  [98 F (36.7 C)-98.3 F (36.8 C)] 98 F (36.7 C) (04/07 0749) Pulse Rate:  [69-83] 69 (04/07 0443) Resp:  [17-21] 18 (04/07 0443) BP: (146-226)/(52-82) 146/65 mmHg (04/07 0443) SpO2:  [98 %-100 %] 100 % (04/07 0841) Weight:  [57.3 kg (126 lb 5.2 oz)] 57.3 kg (126 lb 5.2 oz) (04/07 0551) Last BM Date: 07/08/15 Filed Weights   07/07/15 0343 07/08/15 0400 07/09/15 0551  Weight: 56.019 kg (123 lb 8 oz) 56.654 kg (124 lb 14.4 oz) 57.3 kg (126 lb 5.2 oz)   General: frail but fully alert , comfortable   Heart: RRR Chest: clear bil  No dyspnea or cough Abdomen: soft, NT, ND.  Active BS  Extremities: no CCE Neuro/Psych:  Oriented x 3.  No gross deficits.   Intake/Output from previous day: 04/06 0701 - 04/07 0700 In: 779.2 [I.V.:779.2] Out: 725 [Urine:725]  Intake/Output this shift: Total I/O In: -  Out: 400 [Urine:400]  Lab Results:  Recent Labs  07/07/15 0540 07/08/15 0353 07/09/15 0414  WBC 7.0 17.0* 15.0*  HGB 12.5 12.0 10.9*  HCT 35.4* 35.9* 32.6*  PLT 248 208 176   BMET  Recent Labs  07/07/15 0540 07/08/15 0851 07/09/15 0414  NA 140 139 140  K 4.2 3.7 3.4*  CL 106 106 103  CO2 16* 17* 25  GLUCOSE 227* 249* 236*  BUN 38* 33* 30*  CREATININE 1.86* 1.34* 1.29*  CALCIUM 9.1 9.6 9.0   LFT No results for input(s): PROT, ALBUMIN, AST, ALT, ALKPHOS, BILITOT, BILIDIR, IBILI in the last 72 hours. PT/INR No results for input(s): LABPROT, INR in the last 72 hours. Hepatitis Panel No results for input(s): HEPBSAG, HCVAB, HEPAIGM, HEPBIGM in the last 72 hours.  Studies/Results: Ct Chest Wo Contrast  07/07/2015  CLINICAL DATA:  Shortness of breath the last few days. Unable to have contrast due to  renal function Hx: HTN, Stroke, COPD, Diabetes EXAM: CT CHEST WITHOUT CONTRAST TECHNIQUE: Multidetector CT imaging of the chest was performed following the standard protocol without IV contrast. COMPARISON:  CT abdomen 01/10/2015 and earlier studies FINDINGS: Mediastinum/Lymph Nodes: Coarse coronary and aortic calcifications. No hilar or mediastinal adenopathy, sensitivity decreased without IV contrast. No pericardial effusion. Lungs/Pleura: Lungs clear.  No pleural effusion.  No pneumothorax. Upper abdomen: Cholecystectomy clips. Stable low-attenuation liver lesions since previous CT abdomen 05/21/2014 probably cysts. Musculoskeletal: T11 compression fracture deformity with at least 50% loss of height anteriorly, stable 05/21/2014. No retropulsion. No posterior element involvement. No new fracture. Sternum intact. IMPRESSION: 1. No acute abnormality. 2. Atherosclerosis, including aortic and coronary artery disease. Please note that although the presence of coronary artery calcium documents the presence of coronary artery disease, the severity of this disease and any potential stenosis cannot be assessed on this non-gated CT examination. Assessment for potential risk factor modification, dietary therapy or pharmacologic therapy may be warranted, if clinically indicated. 3. Old L2-T11 compression deformity. Electronically Signed   By: Lucrezia Europe M.D.   On: 07/07/2015 13:07   Nm Pulmonary Perf And Vent  07/07/2015  CLINICAL DATA:  Short of breath EXAM: NUCLEAR MEDICINE VENTILATION - PERFUSION LUNG SCAN TECHNIQUE: Ventilation images  were obtained in multiple projections using inhaled aerosol Tc-37m DTPA. Perfusion images were obtained in multiple projections after intravenous injection of Tc-71m MAA. RADIOPHARMACEUTICALS:  29.5 mCi Technetium-74m DTPA aerosol inhalation and 4.2 mCi Technetium-62m MAA IV COMPARISON:  Chest x-ray 07/06/2015 FINDINGS: Ventilation: No focal ventilation defect. Perfusion: No wedge shaped  peripheral perfusion defects to suggest acute pulmonary embolism. IMPRESSION: Normal ventilation perfusion scan. Very low probability for pulmonary embolism. Electronically Signed   By: Franchot Gallo M.D.   On: 07/07/2015 12:57   Dg Chest Port 1 View  07/08/2015  CLINICAL DATA:  Shortness of breath EXAM: PORTABLE CHEST 1 VIEW COMPARISON:  07/07/2015 FINDINGS: The heart size appears normal. There is aortic atherosclerosis. No pleural effusion or edema. No airspace consolidation identified. Osteoarthritis is noted involving both glenohumeral joints. IMPRESSION: 1. No acute cardiopulmonary abnormalities. Electronically Signed   By: Kerby Moors M.D.   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  Day 2/21 Diflucan.  Puree diet.  Swallowing improved.  Back to her best baseline.    PLAN   *  From GI view, ok to  Discharge home on her usual dysphagia 1 (purreee) diet, thin liquids.  Finish 21 days diflucan.  GI follow up prn.   We are signing off     Azucena Freed  07/09/2015, 11:49 AM Pager: [phone]

## 2015-07-09 NOTE — Care Management Note (Signed)
Case Management Note  Patient Details  Name: Shannon [name] MRN: [id number] Date of Birth: 06/06/1923  Subjective/Objective:  Pt admitted for Dysphagia- Acute hypoxic respiratory failure.Pt is from home with daughter.                    Action/Plan: CM will continue to monitor for disposition needs.    Expected Discharge Date:                  Expected Discharge Plan:  Home/Self Care  In-House Referral:     Discharge planning Services  CM Consult  Post Acute Care Choice:    Choice offered to:     DME Arranged:    DME Agency:     HH Arranged:    HH Agency:     Status of Service:  In process, will continue to follow  Medicare Important Message Given:  Yes Date Medicare IM Given:    Medicare IM give by:    Date Additional Medicare IM Given:    Additional Medicare Important Message give by:     If discussed at [place] of Stay Meetings, dates discussed:    Additional Comments:  Bethena Roys, RN 07/09/2015, 4:02 PM

## 2015-07-09 NOTE — Consult Note (Signed)
[facility] CM Inpatient Consult   07/09/2015  Shannon [name] 1923/07/06 [id number] Patient screened for potential [facility] Care Management services. Patient is eligible for [facility] Care Management services under patient's Medicare plan. Met with patient at bedside, sitting in recliner.  Patient states she was having more difficulty swallowing. 89 yof with hx of Afib, COPD, DM, recurrent SBO, and PUD presented was admitted to [facility] on 4/3 with dysphagia. Patient states her daughter [name] helps her make decisions and she states she remembers getting a letter from [facility].  Patient requested the brochure and contact information and it was given.  Patient encouraged to call if she and her daughter agrees to the services.  Patient verbalized understanding.   For consults or  for questions contact:   Charlesetta Shanks, RN BSN CCM [facility]  [phone] business mobile phone Toll free office [phone]

## 2015-07-09 NOTE — Plan of Care (Signed)
Problem: Skin Integrity: Goal: Risk for impaired skin integrity will decrease Outcome: Progressing Increase activity oob to chair.

## 2015-07-09 NOTE — Care Management Important Message (Signed)
Important Message  Patient Details  Name: Shannon [name] MRN: [id number] Date of Birth: 03-13-1924   Medicare Important Message Given:  Yes    [name] May 07/09/2015, 10:47 AM

## 2015-07-09 NOTE — Consult Note (Signed)
Consultation Note Date: 07/09/2015   Patient Name: Shannon [name]  DOB: Apr 01, 1924  MRN: [id number]  Age / Sex: 82 y.o., female  PCP: Kirt Boys, DO Referring Physician: Leroy Sea, MD  Reason for Consultation: Establishing goals of care  86 yof with hx of Afib, COPD, DM, recurrent SBO, and PUD presented was admitted to [facility] on 4/3 with dysphagia.  During this admission she had an EGD and candida thru out her esophagus was diagnosed.  She was found to be hyperthyroid (T4 is 2.0 and TSH is 0.029) and also developed acute hypoxic respiratory failure and Afib with RVR.  The etiology of the hypoxia is uncertain, and she demonstrated respiratory alkalosis on ABG.  One could speculate this was at least in part due to hyperthyroidism.  She has had a rising lactic acid over the last 24 hours and it is currently 4.7  Clinical Assessment/Narrative:  I met with Shannon [name] at bedside and talked with her daughter, Shannon [name] over the phone.  Shannon [name] is very sharp and capable for 82 years old.  She told me that her daughter Shannon [name] is her health care and durable POA.  That she does have a living will and it indicates that she would not want heroic measures to resuscitate her in the event of a natural death.  It also indicates that her HCPOA, Shannon [name] and exercise her judgement and go against the wishes expressed in the living will.      Shannon [name] told me about her husband of 68 years passing in August of 2016.  He utilized Hospice services for 6 weeks prior to his death.  She told me that she has 3 children - 2 sons and a daughter, and she lives with her daughter.  Per Shannon [name] she takes care of her own ADLs and walks using her walker.  She purees her own food as she has difficulty swallowing.  She believes she is not close to end of life and at this point would like full scope treatment for any illness up to but stopping short of  CPR/Defibrillation/Intubation.   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   Contacts/Participants in Discussion: patient at bedside, daughter over the phone. Primary Decision Maker: patient  HCPOA: yes, Shannon [name]   SUMMARY OF RECOMMENDATIONS No CPR/Defibrillation/Intubation. HCPOA and Living Will are established. Patient will continue to follow up with her PCP, Dr. Montez Morita who will be able to engage Palliative Medicine or Hospice Services as needed.   Code Status/Advance Care Planning: Limited code     Symptom Management:    Primary team has done an excellent job of symptom management for anxiety and dyspnea.  Psycho-social/Spiritual:  Support System: Strong Additional Recommendations: Caregiving  Support/Resources  Prognosis: Unable to determine.  I anticipate that once the patient's candida infection clears and her thyroid levels stabilize she will continue to get stronger.  However she is frail and 82 years old which by default indicates she is at risk for an acute event.  Discharge Planning: Home with Home Health   Chief Complaint/ Primary Diagnoses: Present on Admission:  . Respiratory distress . CVA (cerebral vascular accident) (HCC) . CKD (chronic kidney disease) stage 3, GFR 30-59 ml/min . Chest pain  I have reviewed the medical record, interviewed the patient and family, and examined the patient. The following aspects are pertinent.  Past Medical History  Diagnosis Date  . Pneumonia   . Hyperlipidemia   . Hypertension   .  Small bowel obstruction (HCC)     "I don't know how many" (01/11/2015)  . Ventral hernia with bowel obstruction   . Asthma   . Hyperlipidemia   . Stroke (HCC)     "light one"  . COPD (chronic obstructive pulmonary disease) (HCC)   . Hypothyroidism   . Type II diabetes mellitus (HCC)   . Anemia     previous blood transfusions  . GERD (gastroesophageal reflux disease)   . History of stomach ulcers     . Arthritis     "all over"  . Renal insufficiency   . Depression     "light case"  . SIADH (syndrome of inappropriate ADH production) (HCC)     [name] 01/10/2015   Social History   Social History  . Marital Status: Widowed    Spouse Name: N/A  . Number of Children: N/A  . Years of Education: N/A   Social History Main Topics  . Smoking status: Never Smoker   . Smokeless tobacco: Former Neurosurgeon    Types: Snuff  . Alcohol Use: No  . Drug Use: No  . Sexual Activity: No   Other Topics Concern  . None   Social History Narrative   Married in [place], lives in one story house with 2 people and no pets   Occupation: ?   Has a living Will, [place], and doesn't have DNR   Was living with Husband (in frail health) in [place] until 09/2013.  After her [facility] admission they both came to live with daughter in [place]   Family History  Problem Relation Age of Onset  . Stroke Mother   . Heart attack Neg Hx   . Hypertension Mother   . Diabetes Brother    Scheduled Meds: . amiodarone  200 mg Oral Daily  . clonazePAM  0.5 mg Oral BID  . cycloSPORINE  1 drop Both Eyes BID  . diltiazem  120 mg Oral Daily  . fluconazole  100 mg Oral Daily  . fluticasone  2 spray Each Nare Daily  . insulin aspart  0-5 Units Subcutaneous QHS  . insulin aspart  0-9 Units Subcutaneous TID WC  . ipratropium-albuterol  3 mL Nebulization BID  . latanoprost  1 drop Both Eyes QHS  . [START ON 07/12/2015] levothyroxine  100 mcg Oral QAC breakfast  . metoprolol  5 mg Intravenous Q12H  . mometasone-formoterol  2 puff Inhalation BID  . nitroGLYCERIN  0.5 inch Topical 4 times per day  . nystatin  5 mL Mouth/Throat QID  . polyvinyl alcohol  1 drop Both Eyes QID  . [START ON 07/10/2015] risperiDONE  1 mg Oral Daily   Continuous Infusions: . lactated ringers 75 mL/hr at 07/09/15 1207   PRN Meds:.albuterol, hydrALAZINE, HYDROmorphone (DILAUDID) injection, nitroGLYCERIN, technetium TC 62M diethylenetriame-pentaacetic  acid Medications Prior to Admission:  Prior to Admission medications   Medication Sig Start Date End Date Taking? Authorizing Provider  acetaminophen (TYLENOL) 325 MG tablet Take 325 mg by mouth every 6 (six) hours as needed for mild pain or fever.    Yes Historical Provider, MD  ADVAIR DISKUS 250-50 MCG/DOSE AEPB INHALE 1 PUFF INTO THE LUNGS 2 (TWO) TIMES DAILY AS NEEDED FOR SHORTNESS OF BREATH 12/01/14  Yes Historical Provider, MD  albuterol (PROVENTIL HFA;VENTOLIN HFA) 108 (90 Base) MCG/ACT inhaler Inhale 2 puffs into the lungs every 6 (six) hours as needed for wheezing or shortness of breath. 06/04/15  Yes Monica Carter, DO  amLODipine (NORVASC) 10 MG tablet  TAKE 1 TABLET BY MOUTH DAILY FOR HYPERTENSION 03/15/15  Yes Kirt Boys, DO  aspirin EC 81 MG tablet Take 81 mg by mouth every morning.   Yes Historical Provider, MD  atorvastatin (LIPITOR) 10 MG tablet TAKE 1 TABLET BY MOUTH EVERY DAY Patient taking differently: TAKE 1 TABLET BY MOUTH EVERY DAY IN MORNING 04/08/15  Yes Kirt Boys, DO  b complex vitamins tablet Take 1 tablet by mouth daily.    Yes Historical Provider, MD  calcium-vitamin D (OSCAL WITH D) 500-200 MG-UNIT per tablet Take 1 tablet by mouth daily with breakfast.    Yes Historical Provider, MD  chlorhexidine (PERIDEX) 0.12 % solution 15 mLs by Mouth Rinse route 2 (two) times daily. Patient taking differently: 15 mLs by Mouth Rinse route every evening.  03/26/14  Yes Alison Murray, MD  cloNIDine (CATAPRES) 0.1 MG tablet Take 1 tablet (0.1 mg total) by mouth 2 (two) times daily. 07/13/14  Yes Lars Masson, MD  [employer] GAS RELIEF 80 MG chewable tablet CHEW 1 TABLET BY MOUTH 4 TIMES A DAY AS NEEDED FOR FLATULENCE 06/28/15  Yes Kirt Boys, DO  diclofenac sodium (VOLTAREN) 1 % GEL Apply 2 g topically daily as needed (for pain).    Yes Historical Provider, MD  feeding supplement (BOOST / RESOURCE BREEZE) LIQD Take 1 Container by mouth 3 (three) times daily between meals. 01/27/15   Yes Shanker Levora Dredge, MD  fluticasone (FLONASE) 50 MCG/ACT nasal spray Place 2 sprays into both nostrils daily. 08/07/14  Yes Kirt Boys, DO  gabapentin (NEURONTIN) 300 MG capsule TAKE 1 CAPSULE (300 MG TOTAL) BY MOUTH 3 (THREE) TIMES DAILY. 05/31/15  Yes Kirt Boys, DO  guaiFENesin (MUCINEX) 600 MG 12 hr tablet Take 1 tablet (600 mg total) by mouth 2 (two) times daily. Patient taking differently: Take 600 mg by mouth 2 (two) times daily as needed for cough or to loosen phlegm.  06/20/15  Yes Linwood Dibbles, MD  hydrALAZINE (APRESOLINE) 100 MG tablet TAKE 1 TABLET (100 MG TOTAL) BY MOUTH 3 (THREE) TIMES DAILY. 05/31/15  Yes Kirt Boys, DO  isosorbide mononitrate (IMDUR) 30 MG 24 hr tablet Take one tablet by mouth once daily. If Chest Pains occur increase to two tablets daily. Patient taking differently: Take 30 mg by mouth daily.  07/22/14  Yes Monica Carter, DO  lactulose (CHRONULAC) 10 GM/15ML solution Take 15 mLs (10 g total) by mouth daily as needed for mild constipation. 06/02/14  Yes Mahima Pandey, MD  latanoprost (XALATAN) 0.005 % ophthalmic solution Place 1 drop into both eyes at bedtime. 06/02/14  Yes Mahima Glade Lloyd, MD  levothyroxine (SYNTHROID, LEVOTHROID) 200 MCG tablet Take one tablet by mouth once daily 30 minutes before breakfast for thyroid Patient taking differently: Take 200 mcg by mouth daily before breakfast. Take one tablet by mouth once daily 30 minutes before breakfast for thyroid 07/05/15  Yes Kirt Boys, DO  metoprolol succinate (TOPROL-XL) 25 MG 24 hr tablet TAKE 1 TABLET (25 MG TOTAL) BY MOUTH DAILY. 06/17/15  Yes Sharon Seller, NP  Multiple Vitamins-Minerals (MULTIVITAMIN WITH MINERALS) tablet Take 1 tablet by mouth every morning.    Yes Historical Provider, MD  pantoprazole (PROTONIX) 40 MG tablet TAKE 1 TABLET BY MOUTH TWICE A DAY 08/07/14  Yes Kirt Boys, DO  Polyethyl Glycol-Propyl Glycol 0.4-0.3 % SOLN Place 1 drop into both eyes 4 (four) times daily.    Yes Historical  Provider, MD  polyethylene glycol powder (GLYCOLAX/MIRALAX) powder Take 17 g by mouth 2 (  two) times daily as needed. Patient taking differently: Take 17 g by mouth 2 (two) times daily.  03/12/15  Yes Monica Carter, DO  RESTASIS 0.05 % ophthalmic emulsion USE 1 DROP INTO BOTH EYES TWICE DAILY 10/19/14  Yes Kirt Boys, DO  sennosides-docusate sodium (SENOKOT-S) 8.6-50 MG tablet Take 1 tablet by mouth 2 (two) times daily.    Yes Historical Provider, MD  sertraline (ZOLOFT) 100 MG tablet TAKE 1 TABLET BY MOUTH ONCE DAILY FOR DEPRESSION 07/05/15  Yes Kirt Boys, DO  sodium chloride 1 g tablet TAKE 1 TABLET BY MOUTH THREE TIMES DAILY 05/19/15  Yes Kirt Boys, DO  traMADol (ULTRAM) 50 MG tablet Take 50 mg by mouth 2 (two) times daily as needed for moderate pain.    Yes Historical Provider, MD   No Known Allergies  Review of Systems:  No pain, vomiting, constipation, confusion.  She describes recent dyspnea and anxiety.  Physical Exam Frail very pleasant AA female sitting up in her chair.  A&O x 3 Resp:  No increased work of breathing Extremities:  Able to move all 4, no edema evident. Psych:  Cooperative, well groomed, appropriate.  Vital Signs: BP 146/65 mmHg  Pulse 69  Temp(Src) 98 F (36.7 C) (Oral)  Resp 18  Ht 5\' 6"  (1.676 m)  Wt 57.3 kg (126 lb 5.2 oz)  BMI 20.40 kg/m2  SpO2 100%  SpO2: SpO2: 100 % O2 Device:SpO2: 100 % O2 Flow Rate: .O2 Flow Rate (L/min): 2 L/min  IO: Intake/output summary:  Intake/Output Summary (Last 24 hours) at 07/09/15 1540 Last data filed at 07/09/15 0750  Gross per 24 hour  Intake 779.16 ml  Output    400 ml  Net 379.16 ml    LBM: Last BM Date: 07/08/15 Baseline Weight: Weight: 60.4 kg (133 lb 2.5 oz) Most recent weight: Weight: 57.3 kg (126 lb 5.2 oz)      Palliative Assessment/Data:  Flowsheet Rows        Most Recent Value   Intake Tab    Referral Department  Hospitalist   Unit at Time of Referral  Med/Surg Unit   Palliative Care  Primary Diagnosis  Sepsis/Infectious Disease   Date Notified  07/08/15   Palliative Care Type  New Palliative care   Reason for referral  Clarify Goals of Care   Date of Admission  07/05/15   Date first seen by Palliative Care  07/09/15   # of days Palliative referral response time  1 Day(s)   # of days IP prior to Palliative referral  3   Clinical Assessment    Palliative Performance Scale Score  60%   Psychosocial & Spiritual Assessment    Palliative Care Outcomes    Patient/Family meeting held?  Yes   Who was at the meeting?  patient at bedside.  Dtr on the phone   Palliative Care Outcomes  Clarified goals of care      Additional Data Reviewed:  CBC:    Component Value Date/Time   WBC 15.0* 07/09/2015 0414   WBC 7.1 02/02/2015 1534   HGB 10.9* 07/09/2015 0414   HCT 32.6* 07/09/2015 0414   HCT 28.5* 02/02/2015 1534   PLT 176 07/09/2015 0414   PLT 404* 02/02/2015 1534   MCV 90.1 07/09/2015 0414   MCV 96 02/02/2015 1534   NEUTROABS 4.7 07/05/2015 1709   NEUTROABS 5.1 02/02/2015 1534   LYMPHSABS 1.4 07/05/2015 1709   LYMPHSABS 1.1 02/02/2015 1534   MONOABS 0.7 07/05/2015 1709   EOSABS  0.2 07/05/2015 1709   EOSABS 0.3 02/02/2015 1534   EOSABS 0.2 06/02/2014 1629   BASOSABS 0.0 07/05/2015 1709   BASOSABS 0.1 02/02/2015 1534   Comprehensive Metabolic Panel:    Component Value Date/Time   NA 140 07/09/2015 0414   NA 130* 06/16/2015 1121   K 3.4* 07/09/2015 0414   CL 103 07/09/2015 0414   CO2 25 07/09/2015 0414   BUN 30* 07/09/2015 0414   BUN 33 06/16/2015 1121   CREATININE 1.29* 07/09/2015 0414   CREATININE 1.3* 05/06/2014   GLUCOSE 236* 07/09/2015 0414   GLUCOSE 156* 06/16/2015 1121   CALCIUM 9.0 07/09/2015 0414   AST 24 07/05/2015 1709   ALT 24 07/05/2015 1709   ALKPHOS 63 07/05/2015 1709   BILITOT 0.9 07/05/2015 1709   BILITOT 0.3 04/07/2015 1015   PROT 7.1 07/05/2015 1709   PROT 6.9 04/07/2015 1015   ALBUMIN 3.8 07/05/2015 1709   ALBUMIN 4.2 04/07/2015  1015     Time In: 3:15 Time Out: 4:05 Time Total: 50 min. Greater than 50%  of this time was spent counseling and coordinating care related to the above assessment and plan.  Signed by: Algis Downs, PA-C Palliative Medicine Pager: [phone]  07/09/2015, 3:40 PM  Please contact Palliative Medicine Team phone at [phone] for questions and concerns.

## 2015-07-09 NOTE — Progress Notes (Addendum)
Patient Name: Shannon [name] Date of Encounter: 07/09/2015  Primary Cardiologist: Dr. Meda Coffee   82 yo female w/ PMH of Type 2 DM, CKD Stage 3, nonobstructive CAD and HTN admitted 04/03 w/ dysphagia and went into rapid aflutter.    Principal Problem:   Dysphagia Active Problems:   CVA (cerebral vascular accident) ([employer])   CKD (chronic kidney disease) stage 3, GFR 30-59 ml/min   Chest pain   Respiratory distress   Atrial flutter with rapid ventricular response (HCC)   Acute renal failure superimposed on stage 3 chronic kidney disease ([place])   Encounter for long-term (current) use of other high-risk medications   Dyspnea   SOB (shortness of breath)    SUBJECTIVE  The patient feels great today, denies any symptoms.  CURRENT MEDS . amiodarone  200 mg Oral Daily  . cycloSPORINE  1 drop Both Eyes BID  . diltiazem  120 mg Oral Daily  . fluconazole  100 mg Oral Daily  . fluticasone  2 spray Each Nare Daily  . insulin aspart  0-5 Units Subcutaneous QHS  . insulin aspart  0-9 Units Subcutaneous TID WC  . ipratropium-albuterol  3 mL Nebulization BID  . latanoprost  1 drop Both Eyes QHS  . metoprolol  5 mg Intravenous Q12H  . mometasone-formoterol  2 puff Inhalation BID  . nitroGLYCERIN  0.5 inch Topical 4 times per day  . nystatin  5 mL Mouth/Throat QID  . polyvinyl alcohol  1 drop Both Eyes QID  . potassium chloride  40 mEq Oral Once  . risperiDONE  0.5 mg Oral Daily  . sodium chloride  1,000 mL Intravenous Once   . lactated ringers     OBJECTIVE  Filed Vitals:   07/09/15 0443 07/09/15 0551 07/09/15 0749 07/09/15 0841  BP: 146/65     Pulse: 69     Temp: 98.3 F (36.8 C)  98 F (36.7 C)   TempSrc: Oral  Oral   Resp: 18     Height:      Weight:  126 lb 5.2 oz (57.3 kg)    SpO2: 100%   100%    Intake/Output Summary (Last 24 hours) at 07/09/15 1000 Last data filed at 07/09/15 0750  Gross per 24 hour  Intake 779.16 ml  Output    950 ml  Net -170.84 ml   Filed Weights    07/07/15 0343 07/08/15 0400 07/09/15 0551  Weight: 123 lb 8 oz (56.019 kg) 124 lb 14.4 oz (56.654 kg) 126 lb 5.2 oz (57.3 kg)   PHYSICAL EXAM  General: Pleasant, NAD. Neuro: Alert and oriented X 3. Moves all extremities spontaneously. Psych: Normal affect. HEENT:  Normal  Neck: Supple without bruits or JVD. Lungs:  Resp regular and unlabored, CTA. Heart: RRR no s3, s4, or murmurs. Abdomen: Soft, non-tender, non-distended, BS + x 4.  Extremities: No clubbing, cyanosis or edema. DP/PT/Radials 2+ and equal bilaterally.  Accessory Clinical Findings  CBC  Recent Labs  07/08/15 0353 07/09/15 0414  WBC 17.0* 15.0*  HGB 12.0 10.9*  HCT 35.9* 32.6*  MCV 91.1 90.1  PLT 208 0000000   Basic Metabolic Panel  Recent Labs  07/08/15 0851 07/09/15 0414  NA 139 140  K 3.7 3.4*  CL 106 103  CO2 17* 25  GLUCOSE 249* 236*  BUN 33* 30*  CREATININE 1.34* 1.29*  CALCIUM 9.6 9.0   Liver Function Tests No results for input(s): AST, ALT, ALKPHOS, BILITOT, PROT, ALBUMIN in the last 72 hours. Cardiac  Enzymes  Recent Labs  07/07/15 1308 07/07/15 1831 07/07/15 2244  TROPONINI 0.40* 0.41* 0.31*   Thyroid Function Tests  Recent Labs  07/06/15 1501  TSH 0.029*    TELE Converted from aflutter to NSR around 3PM yesterday, NSR overnight, some tachycardia this morning.     ECG  No new EKG, EKG last night no acute changes  Echocardiogram 01/14/2015  LV EF: 55% - 60%  ------------------------------------------------------------------- Indications: CAD of native vessels 414.01.  ------------------------------------------------------------------- History: PMH: Chest pain. Atrial fibrillation. Stroke. Risk factors: Hypertension. Dyslipidemia.  ------------------------------------------------------------------- Study Conclusions  - Left ventricle: The cavity size was normal. Systolic function was  normal. The estimated ejection fraction was in the range of  55%  to 60%. Wall motion was normal; there were no regional wall  motion abnormalities. - Aortic valve: There was mild to moderate regurgitation.  Regurgitation pressure half-time: 365 ms. - Left atrium: The atrium was severely dilated. - Right ventricle: The cavity size was normal. Wall thickness was  normal. Systolic function was normal. - Tricuspid valve: There was mild regurgitation. - Pulmonary arteries: PA peak pressure: 42 mm Hg (S). - Inferior vena cava: The vessel was normal in size. The  respirophasic diameter changes were in the normal range (>= 50%),  consistent with normal central venous pressure.    Radiology/Studies  Ct Abdomen Pelvis Wo Contrast  07/05/2015  CLINICAL DATA:  Bloating, difficulty swallowing, history small-bowel obstruction, ventral hernia, GERD, stomach ulcers, hypertension, type II diabetes mellitus, COPD, asthma EXAM: CT ABDOMEN AND PELVIS WITHOUT CONTRAST TECHNIQUE: Multidetector CT imaging of the abdomen and pelvis was performed following the standard protocol without IV contrast. Sagittal and coronal MPR images reconstructed from axial data set. Patient drank dilute oral contrast for exam. COMPARISON:  01/10/2015 FINDINGS: Lung bases clear. Multiple low-attenuation hepatic foci likely cysts, unchanged. Gallbladder surgically absent. Remainder of liver, spleen, pancreas, kidneys, and adrenal glands normal. Extensive atherosclerotic calcifications. Questionable mild wall thickening of distal esophagus. Increased thickening of the gastric wall versus the previous exam, nonspecific. Small ventral hernia containing nonobstructed bowel loop. Scattered colonic diverticulosis without evidence of diverticulitis or bowel obstruction. Bladder, ureters, uterus and adnexa normal appearance. Appendix not visualized. No mass, adenopathy, free air or free fluid. Bones demineralized. IMPRESSION: No definite acute intra abdominal abnormalities. Questionable mild wall  thickening of the gastric wall versus artifact from underdistention ; consider followup endoscopy or upper GI assessment if patient has persistent symptoms. Electronically Signed   By: Lavonia Dana M.D.   On: 07/05/2015 19:53   Dg Chest 1 View  07/05/2015  CLINICAL DATA:  Dysphagia EXAM: CHEST 1 VIEW COMPARISON:  06/20/2015 FINDINGS: There is unchanged mild cardiomegaly. The lungs are clear. No large effusions. Normal pulmonary vasculature. Unremarkable hilar and mediastinal contours, unchanged. IMPRESSION: Cardiomegaly, unchanged.  No acute cardiopulmonary findings. Electronically Signed   By: Andreas Newport M.D.   On: 07/05/2015 22:29   Dg Chest 2 View  06/20/2015  CLINICAL DATA:  Shortness of breath and cough EXAM: CHEST  2 VIEW COMPARISON:  06/20/2015, 1625 hours FINDINGS: Cardiac shadow is again mildly enlarged. Lungs are well aerated bilaterally. No acute bony abnormality is seen. Stable compression deformity is noted in the lower thoracic spine. IMPRESSION: No acute abnormality noted.  No change from 2 hours previous Electronically Signed   By: Inez Catalina M.D.   On: 06/20/2015 18:42   Dg Chest 2 View  06/20/2015  CLINICAL DATA:  Pt here with cough that started last week, hx of bronchitis last  year EXAM: CHEST  2 VIEW COMPARISON:  01/27/2015 FINDINGS: The heart is enlarged. No focal consolidations or pleural effusions. Stable elevation of right hemidiaphragm. Surgical clips are noted in the upper abdomen. IMPRESSION: Stable cardiomegaly. Electronically Signed   By: Nolon Nations M.D.   On: 06/20/2015 17:44   Ct Chest Wo Contrast  07/07/2015  CLINICAL DATA:  Shortness of breath the last few days. Unable to have contrast due to renal function Hx: HTN, Stroke, COPD, Diabetes EXAM: CT CHEST WITHOUT CONTRAST TECHNIQUE: Multidetector CT imaging of the chest was performed following the standard protocol without IV contrast. COMPARISON:  CT abdomen 01/10/2015 and earlier studies FINDINGS:  Mediastinum/Lymph Nodes: Coarse coronary and aortic calcifications. No hilar or mediastinal adenopathy, sensitivity decreased without IV contrast. No pericardial effusion. Lungs/Pleura: Lungs clear.  No pleural effusion.  No pneumothorax. Upper abdomen: Cholecystectomy clips. Stable low-attenuation liver lesions since previous CT abdomen 05/21/2014 probably cysts. Musculoskeletal: T11 compression fracture deformity with at least 50% loss of height anteriorly, stable 05/21/2014. No retropulsion. No posterior element involvement. No new fracture. Sternum intact. IMPRESSION: 1. No acute abnormality. 2. Atherosclerosis, including aortic and coronary artery disease. Please note that although the presence of coronary artery calcium documents the presence of coronary artery disease, the severity of this disease and any potential stenosis cannot be assessed on this non-gated CT examination. Assessment for potential risk factor modification, dietary therapy or pharmacologic therapy may be warranted, if clinically indicated. 3. Old L2-T11 compression deformity. Electronically Signed   By: Lucrezia Europe M.D.   On: 07/07/2015 13:07   Nm Pulmonary Perf And Vent  07/07/2015  CLINICAL DATA:  Short of breath EXAM: NUCLEAR MEDICINE VENTILATION - PERFUSION LUNG SCAN TECHNIQUE: Ventilation images were obtained in multiple projections using inhaled aerosol Tc-45m DTPA. Perfusion images were obtained in multiple projections after intravenous injection of Tc-79m MAA. RADIOPHARMACEUTICALS:  29.5 mCi Technetium-72m DTPA aerosol inhalation and 4.2 mCi Technetium-71m MAA IV COMPARISON:  Chest x-ray 07/06/2015 FINDINGS: Ventilation: No focal ventilation defect. Perfusion: No wedge shaped peripheral perfusion defects to suggest acute pulmonary embolism. IMPRESSION: Normal ventilation perfusion scan. Very low probability for pulmonary embolism. Electronically Signed   By: Franchot Gallo M.D.   On: 07/07/2015 12:57   Dg Chest Port 1  View  07/08/2015  CLINICAL DATA:  Shortness of breath EXAM: PORTABLE CHEST 1 VIEW COMPARISON:  07/07/2015 FINDINGS: The heart size appears normal. There is aortic atherosclerosis. No pleural effusion or edema. No airspace consolidation identified. Osteoarthritis is noted involving both glenohumeral joints. IMPRESSION: 1. No acute cardiopulmonary abnormalities. Electronically Signed   By: Kerby Moors M.D.   On: 07/08/2015 18:02   Dg Chest Port 1 View  07/06/2015  CLINICAL DATA:  Dyspnea EXAM: PORTABLE CHEST 1 VIEW COMPARISON:  07/05/2015 FINDINGS: Cardiomediastinal silhouette is stable. No segmental infiltrate or pleural effusion. Central mild vascular congestion without convincing pulmonary edema. Elevation of the right hemidiaphragm again noted. IMPRESSION: No segmental infiltrate or pleural effusion. Central mild vascular congestion without convincing pulmonary edema. Elevation of the right hemidiaphragm again noted. Electronically Signed   By: Lahoma Crocker M.D.   On: 07/06/2015 09:58    ASSESSMENT AND PLAN  1. Paroxysmal atrial flutter with RVR 2. Chest pain with elevated trop 3. Dysphagia: EGD cancelled.  4. Mild fluid overload in the setting aflutter with RVR, given IV lasix.  5. Dyspnea: restrictive lung disease  82 year old female admitted who was admitted with dysphagia, she developed an acute atrial flutter with RVR with respiratory distress and  mild fluid overload upon presentation.  She was started on amiodarone drip, and cardioverted back to SR within 24 hours. She was still tachypnic and evaluated by pulmonary who stated that she has restrictive lung disease. Her troponin was mildly elevated in the settings of a-flutter with RVR and acute on chronic kidney failure.  She now feels better, EGD yesterday and showed diffuse candida esophagitis and pyloric stenosis.  Continue po amiodarone 400 mg po daily. She has normal LVEF on yesterdays echo, LA is moderately to severely dilated.. Her  recent TSH was 0.029 with high fT4, her Synthroid dose should be decreased. Crea is improving 1.8--> 1.2. BP is improved after starting cardizem CD 120 mg po daily. She is now euvolemic. Considering h/o CVA, her CHADS-VASc is 6, she will need to be on long term anticoagulation, Eliquis 2.5 mg po BID considering her age and impaired kidney function.   Dorothy Spark 07/09/2015

## 2015-07-10 LAB — CBC
HCT: 34.2 % — ABNORMAL LOW (ref 36.0–46.0)
Hemoglobin: 11.7 g/dL — ABNORMAL LOW (ref 12.0–15.0)
MCH: 31.4 pg (ref 26.0–34.0)
MCHC: 34.2 g/dL (ref 30.0–36.0)
MCV: 91.7 fL (ref 78.0–100.0)
Platelets: 147 10*3/uL — ABNORMAL LOW (ref 150–400)
RBC: 3.73 MIL/uL — ABNORMAL LOW (ref 3.87–5.11)
RDW: 17.1 % — ABNORMAL HIGH (ref 11.5–15.5)
WBC: 7.4 10*3/uL (ref 4.0–10.5)

## 2015-07-10 LAB — GLUCOSE, CAPILLARY
Glucose-Capillary: 140 mg/dL — ABNORMAL HIGH (ref 65–99)
Glucose-Capillary: 141 mg/dL — ABNORMAL HIGH (ref 65–99)
Glucose-Capillary: 157 mg/dL — ABNORMAL HIGH (ref 65–99)
Glucose-Capillary: 164 mg/dL — ABNORMAL HIGH (ref 65–99)

## 2015-07-10 LAB — BASIC METABOLIC PANEL
Anion gap: 12 (ref 5–15)
BUN: 24 mg/dL — ABNORMAL HIGH (ref 6–20)
CO2: 25 mmol/L (ref 22–32)
Calcium: 9 mg/dL (ref 8.9–10.3)
Chloride: 103 mmol/L (ref 101–111)
Creatinine, Ser: 0.98 mg/dL (ref 0.44–1.00)
GFR calc Af Amer: 57 mL/min — ABNORMAL LOW (ref >60)
GFR calc non Af Amer: 49 mL/min — ABNORMAL LOW (ref >60)
Glucose, Bld: 151 mg/dL — ABNORMAL HIGH (ref 65–99)
Potassium: 4.2 mmol/L (ref 3.5–5.1)
Sodium: 140 mmol/L (ref 135–145)

## 2015-07-10 LAB — LACTIC ACID, PLASMA: Lactic Acid, Venous: 1.7 mmol/L (ref 0.5–2.0)

## 2015-07-10 LAB — TSH: TSH: 0.113 u[IU]/mL — ABNORMAL LOW (ref 0.350–4.500)

## 2015-07-10 MED ORDER — HYDRALAZINE HCL 50 MG PO TABS
50.0000 mg | ORAL_TABLET | Freq: Three times a day (TID) | ORAL | Status: DC
  Administered 2015-07-10 – 2015-07-11 (×3): 50 mg via ORAL
  Filled 2015-07-10 (×3): qty 1

## 2015-07-10 MED ORDER — OFF THE BEAT BOOK
Freq: Once | Status: AC
  Administered 2015-07-10: 14:00:00
  Filled 2015-07-10: qty 1

## 2015-07-10 MED ORDER — APIXABAN 2.5 MG PO TABS
2.5000 mg | ORAL_TABLET | Freq: Two times a day (BID) | ORAL | Status: DC
  Administered 2015-07-10 – 2015-07-11 (×2): 2.5 mg via ORAL
  Filled 2015-07-10 (×2): qty 1

## 2015-07-10 MED ORDER — APIXABAN 5 MG PO TABS
5.0000 mg | ORAL_TABLET | Freq: Two times a day (BID) | ORAL | Status: DC
  Administered 2015-07-10: 5 mg via ORAL
  Filled 2015-07-10: qty 1

## 2015-07-10 MED ORDER — ISOSORBIDE MONONITRATE ER 60 MG PO TB24
60.0000 mg | ORAL_TABLET | Freq: Every day | ORAL | Status: DC
  Administered 2015-07-10 – 2015-07-11 (×2): 60 mg via ORAL
  Filled 2015-07-10 (×2): qty 1

## 2015-07-10 MED ORDER — METOPROLOL TARTRATE 50 MG PO TABS
50.0000 mg | ORAL_TABLET | Freq: Two times a day (BID) | ORAL | Status: DC
  Administered 2015-07-10: 50 mg via ORAL
  Filled 2015-07-10: qty 1

## 2015-07-10 NOTE — Plan of Care (Signed)
Problem: Activity: Goal: Ability to tolerate increased activity will improve Outcome: Progressing Ambulates in hallway with walker from home. Tolerates well.

## 2015-07-10 NOTE — Discharge Instructions (Addendum)
Follow with Primary MD Gildardo Cranker, DO in 3 days   Get CBC, CMP, 2 view Chest X ray checked  by Primary MD next visit. TSH check in 2 weeks   Activity: As tolerated with Full fall precautions use walker/cane & assistance as needed   Disposition Home     Diet:  Dysphagia 1 diet with feeding assistance and aspiration precautions.  For Heart failure patients - Check your Weight same time everyday, if you gain over 2 pounds, or you develop in leg swelling, experience more shortness of breath or chest pain, call your Primary MD immediately. Follow Cardiac Low Salt Diet and 1.5 lit/day fluid restriction.   On your next visit with your primary care physician please Get Medicines reviewed and adjusted.   Please request your Prim.MD to go over all Hospital Tests and Procedure/Radiological results at the follow up, please get all Hospital records sent to your Prim MD by signing hospital release before you go home.   If you experience worsening of your admission symptoms, develop shortness of breath, life threatening emergency, suicidal or homicidal thoughts you must seek medical attention immediately by calling 911 or calling your MD immediately  if symptoms less severe.  You Must read complete instructions/literature along with all the possible adverse reactions/side effects for all the Medicines you take and that have been prescribed to you. Take any new Medicines after you have completely understood and accpet all the possible adverse reactions/side effects.   Do not drive, operating heavy machinery, perform activities at heights, swimming or participation in water activities or provide baby sitting services if your were admitted for syncope or siezures until you have seen by Primary MD or a Neurologist and advised to do so again.  Do not drive when taking Pain medications.    Do not take more than prescribed Pain, Sleep and Anxiety Medications  Special Instructions: If you have smoked  or chewed Tobacco  in the last 2 yrs please stop smoking, stop any regular Alcohol  and or any Recreational drug use.  Wear Seat belts while driving.   Please note  You were cared for by a hospitalist during your hospital stay. If you have any questions about your discharge medications or the care you received while you were in the hospital after you are discharged, you can call the unit and asked to speak with the hospitalist on call if the hospitalist that took care of you is not available. Once you are discharged, your primary care physician will handle any further medical issues. Please note that NO REFILLS for any discharge medications will be authorized once you are discharged, as it is imperative that you return to your primary care physician (or establish a relationship with a primary care physician if you do not have one) for your aftercare needs so that they can reassess your need for medications and monitor your lab values.    Information on my medicine - ELIQUIS (apixaban)  This medication education was reviewed with me or my healthcare representative as part of my discharge preparation.  The pharmacist that spoke with me during my hospital stay was:  Kem Parkinson, [place]  Why was Eliquis prescribed for you? Eliquis was prescribed for you to reduce the risk of a blood clot forming that can cause a stroke if you have a medical condition called atrial fibrillation (a type of irregular heartbeat).  What do You need to know about Eliquis ? Take your Eliquis TWICE DAILY - one tablet  in the morning and one tablet in the evening with or without food. If you have difficulty swallowing the tablet whole please discuss with your pharmacist how to take the medication safely.  Take Eliquis exactly as prescribed by your doctor and DO NOT stop taking Eliquis without talking to the doctor who prescribed the medication.  Stopping may increase your risk of developing a stroke.  Refill your  prescription before you run out.  After discharge, you should have regular check-up appointments with your healthcare provider that is prescribing your Eliquis.  In the future your dose may need to be changed if your kidney function or weight changes by a significant amount or as you get older.  What do you do if you miss a dose? If you miss a dose, take it as soon as you remember on the same day and resume taking twice daily.  Do not take more than one dose of ELIQUIS at the same time to make up a missed dose.  Important Safety Information A possible side effect of Eliquis is bleeding. You should call your healthcare provider right away if you experience any of the following: ? Bleeding from an injury or your nose that does not stop. ? Unusual colored urine (red or dark brown) or unusual colored stools (red or black). ? Unusual bruising for unknown reasons. ? A serious fall or if you hit your head (even if there is no bleeding).  Some medicines may interact with Eliquis and might increase your risk of bleeding or clotting while on Eliquis. To help avoid this, consult your healthcare provider or pharmacist prior to using any new prescription or non-prescription medications, including herbals, vitamins, non-steroidal anti-inflammatory drugs (NSAIDs) and supplements.  This website has more information on Eliquis (apixaban): http://www.eliquis.com/eliquis/home

## 2015-07-10 NOTE — Plan of Care (Signed)
Problem: Cardiac: Goal: Ability to achieve and maintain adequate cardiopulmonary perfusion will improve Outcome: Progressing Heart Rate controlled in Afib. Hydralazine added for elevated BP.

## 2015-07-10 NOTE — Plan of Care (Signed)
Problem: Nutrition: Goal: Adequate nutrition will be maintained Outcome: Progressing Tolerates pureed diet.

## 2015-07-10 NOTE — Progress Notes (Addendum)
ANTICOAGULATION CONSULT NOTE - Initial Consult  Pharmacy Consult for Apixaban Indication: atrial fibrillation  No Known Allergies  Patient Measurements: Height: 5\' 6"  (167.6 cm) Weight: 130 lb 11.7 oz (59.3 kg) IBW/kg (Calculated) : 59.3  Vital Signs: Temp: 97.3 F (36.3 C) (04/08 0745) Temp Source: Oral (04/08 0745) BP: 177/73 mmHg (04/08 0745) Pulse Rate: 71 (04/08 0755)  Labs:  Recent Labs  07/07/15 1308 07/07/15 1831 07/07/15 2049 07/07/15 2244  07/08/15 0353 07/08/15 0851 07/09/15 0414 07/10/15 0546  HGB  --   --   --   --   < > 12.0  --  10.9* 11.7*  HCT  --   --   --   --   --  35.9*  --  32.6* 34.2*  PLT  --   --   --   --   --  208  --  176 147*  HEPARINUNFRC  --   --  0.30  --   --  0.54  --   --   --   CREATININE  --   --   --   --   --   --  1.34* 1.29* 0.98  TROPONINI 0.40* 0.41*  --  0.31*  --   --   --   --   --   < > = values in this interval not displayed.  Estimated Creatinine Clearance: 35.7 mL/min (by C-G formula based on Cr of 0.98).   Medical History: Past Medical History  Diagnosis Date  . Pneumonia   . Hyperlipidemia   . Hypertension   . Small bowel obstruction ([place])     "I don't know how many" (01/11/2015)  . Ventral hernia with bowel obstruction   . Asthma   . Hyperlipidemia   . Stroke ([place])     "light one"  . COPD (chronic obstructive pulmonary disease) ([place])   . Hypothyroidism   . Type II diabetes mellitus ([place])   . Anemia     previous blood transfusions  . GERD (gastroesophageal reflux disease)   . History of stomach ulcers   . Arthritis     "all over"  . Renal insufficiency   . Depression     "light case"  . SIADH (syndrome of inappropriate ADH production) ([place])     [name] 01/10/2015    Medications:  Scheduled:  . amiodarone  200 mg Oral Daily  . clonazePAM  0.5 mg Oral BID  . cycloSPORINE  1 drop Both Eyes BID  . fluconazole  100 mg Oral Daily  . fluticasone  2 spray Each Nare Daily  . insulin aspart  0-5  Units Subcutaneous QHS  . insulin aspart  0-9 Units Subcutaneous TID WC  . ipratropium-albuterol  3 mL Nebulization BID  . isosorbide mononitrate  60 mg Oral Daily  . latanoprost  1 drop Both Eyes QHS  . metoprolol tartrate  50 mg Oral BID  . mometasone-formoterol  2 puff Inhalation BID  . nystatin  5 mL Mouth/Throat QID  . polyvinyl alcohol  1 drop Both Eyes QID  . risperiDONE  1 mg Oral Daily   Infusions:   PRN: albuterol, hydrALAZINE, HYDROmorphone (DILAUDID) injection, nitroGLYCERIN, technetium TC 16M diethylenetriame-pentaacetic acid  Assessment: 82 YOF that presented with dysphagia and found to be in afib w/ RVR. Pharmacy has been consulted to start eliquis for afib.  SCr 0.98, Age 82 YO, Weight 59.3.   No bleeding currently noted currently.  Hgb 11.7, Plts 147.   Plan:  Eliquis  2.5 mg BID Monitor for s/sx of bleeding, changes in renal function Will counsel patient on Eliquis today    Bennye Alm, PharmD Pharmacy Resident [phone]

## 2015-07-10 NOTE — Progress Notes (Signed)
[facility] Progress Note                                                                                                                                                                                                                      Patient Demographics:    Shannon [name], is a 82 y.o. female, DOB - [birth year], UVO:[id number]  Admit date - 07/05/2015   Admitting Physician Hillary Bow, DO  Outpatient Primary MD for the patient is Kirt Boys, DO  LOS - 5  Outpatient Specialists:  Chief Complaint  Patient presents with  . Dysphagia        Subjective:    Shannon [name] today has, No headache, No chest pain, No abdominal pain - No Nausea, No new weakness tingling or numbness, No Cough - Shortness of breath has almost completely resolved.   Assessment  & Plan :     1. Acute Hypocapnic respiratory failure with respiratory alkalosis and tachypnea -   Due to combination of hyperthyroidism, GI discomfort due to esophagitis along with anxiety, in turn causing compensatory drop in bicarbonate, mildly elevated lactate due to work of breathing and dehydration from decreased oral intake due to esophagitis.   Treated her tachypnea with holding Synthroid for hypothyroidism, Risperdal and low-dose benzodiazepine for anxiety, and PPI along with antifungal medication for esophagitis. Respiratory alkalosis much improved and almost completely resolved clinically, she is now breathing less than 15 a minute, Bicarbonate levels normal.  She was seen by cardiology and pulmonary,  VQ scan unremarkable, noncontrast CT chest unremarkable, no evidence of frank fluid overload or infiltrate. With above treatment now back to baseline. Almost completely off oxygen.     2. A. fib RVR. Paroxysmal. [place] vasc 2 score of greater than 5. Cardiology following, currently in sinus, on amiodarone and will switch  diltiazem to Lopressor (as she has ongoing hyperthyroidism), goal is rate controlled  . Echo stable with preserved EF of 60%, chronic diastolic dysfunction and no wall motion abnormality, TSH was low and Synthroid held, will place her on Eliquis.  3. NSTEMI. Likely due to demand ischemia from #1 and 2 above, pain free, cardiology on board, Echo stable. Will place her on low-dose beta blocker, continue anti-coordination with Eliquis.   4. Iatrogenic hyperthyroidism. Synthroid was held for a few days. Resume low-dose Synthroid once TSH is within normal range.  5. Dysphagia with GERD. GI following, he underwent EGD on 07/08/2015 showing candidiasis esophagitis along with pyloric stenosis. Placed on Diflucan and statin  swish and swallow. Patient is better. On dysphagia 1 diet. Continue to monitor. Clinically improved. Continue PPI.  6. Hypertension. Placed on Lopressor, will add Imdur for better control.  7. History of asthma/COPD. No wheezing, steroids stopped.   Code Status : No CPR & DNI  Family Communication  : daughter  Disposition Plan  : Stay on tele  Barriers For Discharge : SOB  Consults  : Cards, Pulm, GI  Procedures  :   VQ scan stable  CT chest Non Contrast - non acute  TTE   Left ventricle: The cavity size was normal. Wall thickness was increased in a pattern of mild LVH. Systolic function was normal. The estimated ejection fraction was in the range of 60% to 65%.  Doppler parameters are consistent with abnormal left ventricular relaxation (grade 1 diastolic dysfunction). - Aortic valve: There was mild regurgitation. - Mitral valve: There was mild regurgitation. - Left atrium: The atrium was moderately to severely dilated.   EGD - severe candida esophagitis along with pyloric stenosis.    DVT Prophylaxis  :   Heparin gtt  Lab Results  Component Value Date   PLT 147* 07/10/2015    Antibiotics  :     Anti-infectives    Start     Dose/Rate Route Frequency  Ordered Stop   07/08/15 1800  fluconazole (DIFLUCAN) 40 MG/ML suspension 100 mg     100 mg Oral Daily 07/08/15 1635 07/29/15 0959        Objective:   Filed Vitals:   07/10/15 0407 07/10/15 0745 07/10/15 0827 07/10/15 0829  BP: 165/77 177/73    Pulse: 58 68    Temp: 98 F (36.7 C) 97.3 F (36.3 C)    TempSrc: Oral Oral    Resp: 13 22    Height:      Weight: 59.3 kg (130 lb 11.7 oz)     SpO2: 97% 98% 97% 99%    Wt Readings from Last 3 Encounters:  07/10/15 59.3 kg (130 lb 11.7 oz)  06/20/15 63.504 kg (140 lb)  06/04/15 63.504 kg (140 lb)     Intake/Output Summary (Last 24 hours) at 07/10/15 1132 Last data filed at 07/10/15 1020  Gross per 24 hour  Intake 1281.25 ml  Output   1950 ml  Net -668.75 ml     Physical Exam  Awake Alert, Oriented X 3, No new F.N deficits, Normal affect [place].AT,PERRAL Supple Neck,No JVD, No cervical lymphadenopathy appriciated.  Symmetrical Chest wall movement, Good air movement bilaterally, CTAB RRR,No Gallops,Rubs or new Murmurs, No Parasternal Heave +ve B.Sounds, Abd Soft, No tenderness, No organomegaly appriciated, No rebound - guarding or rigidity. No Cyanosis, Clubbing or edema, No new Rash or bruise      Data Review:    CBC  Recent Labs Lab 07/05/15 1709 07/07/15 0540 07/08/15 0353 07/09/15 0414 07/10/15 0546  WBC 7.1 7.0 17.0* 15.0* 7.4  HGB 12.5 12.5 12.0 10.9* 11.7*  HCT 35.6* 35.4* 35.9* 32.6* 34.2*  PLT 268 248 208 176 147*  MCV 89.4 90.5 91.1 90.1 91.7  MCH 31.4 32.0 30.5 30.1 31.4  MCHC 35.1 35.3 33.4 33.4 34.2  RDW 16.0* 17.0* 17.3* 17.1* 17.1*  LYMPHSABS 1.4  --   --   --   --   MONOABS 0.7  --   --   --   --   EOSABS 0.2  --   --   --   --   BASOSABS 0.0  --   --   --   --  Chemistries   Recent Labs Lab 07/05/15 1709 07/06/15 0411 07/07/15 0540 07/08/15 0851 07/09/15 0414 07/10/15 0546  NA 138 139 140 139 140 140  K 3.9 4.0 4.2 3.7 3.4* 4.2  CL 105 107 106 106 103 103  CO2 20* 21* 16* 17*  25 25  GLUCOSE 101* 100* 227* 249* 236* 151*  BUN 37* 31* 38* 33* 30* 24*  CREATININE 1.62* 1.49* 1.86* 1.34* 1.29* 0.98  CALCIUM 10.1 9.1 9.1 9.6 9.0 9.0  AST 24  --   --   --   --   --   ALT 24  --   --   --   --   --   ALKPHOS 63  --   --   --   --   --   BILITOT 0.9  --   --   --   --   --    ------------------------------------------------------------------------------------------------------------------ No results for input(s): CHOL, HDL, LDLCALC, TRIG, CHOLHDL, LDLDIRECT in the last 72 hours.  Lab Results  Component Value Date   HGBA1C 6.4* 04/07/2015   ------------------------------------------------------------------------------------------------------------------  Recent Labs  07/10/15 0546  TSH 0.113*   ------------------------------------------------------------------------------------------------------------------ No results for input(s): VITAMINB12, FOLATE, FERRITIN, TIBC, IRON, RETICCTPCT in the last 72 hours.  Coagulation profile No results for input(s): INR, PROTIME in the last 168 hours.  No results for input(s): DDIMER in the last 72 hours.  Cardiac Enzymes  Recent Labs Lab 07/07/15 1308 07/07/15 1831 07/07/15 2244  TROPONINI 0.40* 0.41* 0.31*   ------------------------------------------------------------------------------------------------------------------    Component Value Date/Time   BNP 238.3* 06/20/2015 1807    Inpatient Medications  Scheduled Meds: . amiodarone  200 mg Oral Daily  . clonazePAM  0.5 mg Oral BID  . cycloSPORINE  1 drop Both Eyes BID  . diltiazem  120 mg Oral Daily  . fluconazole  100 mg Oral Daily  . fluticasone  2 spray Each Nare Daily  . insulin aspart  0-5 Units Subcutaneous QHS  . insulin aspart  0-9 Units Subcutaneous TID WC  . ipratropium-albuterol  3 mL Nebulization BID  . latanoprost  1 drop Both Eyes QHS  . [START ON 07/12/2015] levothyroxine  100 mcg Oral QAC breakfast  . metoprolol  5 mg Intravenous Q12H   . mometasone-formoterol  2 puff Inhalation BID  . nitroGLYCERIN  0.5 inch Topical 4 times per day  . nystatin  5 mL Mouth/Throat QID  . polyvinyl alcohol  1 drop Both Eyes QID  . risperiDONE  1 mg Oral Daily   Continuous Infusions:   PRN Meds:.albuterol, hydrALAZINE, HYDROmorphone (DILAUDID) injection, nitroGLYCERIN, technetium TC 82M diethylenetriame-pentaacetic acid  Micro Results Recent Results (from the past 240 hour(s))  MRSA PCR Screening     Status: None   Collection Time: 07/06/15  6:24 AM  Result Value Ref Range Status   MRSA by PCR NEGATIVE NEGATIVE Final    Comment:        The GeneXpert MRSA Assay (FDA approved for NASAL specimens only), is one component of a comprehensive MRSA colonization surveillance program. It is not intended to diagnose MRSA infection nor to guide or monitor treatment for MRSA infections.   Urine culture     Status: None (Preliminary result)   Collection Time: 07/08/15  6:26 PM  Result Value Ref Range Status   Specimen Description URINE, RANDOM  Final   Special Requests NONE  Final   Culture CULTURE REINCUBATED FOR BETTER GROWTH  Final   Report Status PENDING  Incomplete  Radiology Reports Ct Abdomen Pelvis Wo Contrast  07/05/2015  CLINICAL DATA:  Bloating, difficulty swallowing, history small-bowel obstruction, ventral hernia, GERD, stomach ulcers, hypertension, type II diabetes mellitus, COPD, asthma EXAM: CT ABDOMEN AND PELVIS WITHOUT CONTRAST TECHNIQUE: Multidetector CT imaging of the abdomen and pelvis was performed following the standard protocol without IV contrast. Sagittal and coronal MPR images reconstructed from axial data set. Patient drank dilute oral contrast for exam. COMPARISON:  01/10/2015 FINDINGS: Lung bases clear. Multiple low-attenuation hepatic foci likely cysts, unchanged. Gallbladder surgically absent. Remainder of liver, spleen, pancreas, kidneys, and adrenal glands normal. Extensive atherosclerotic calcifications.  Questionable mild wall thickening of distal esophagus. Increased thickening of the gastric wall versus the previous exam, nonspecific. Small ventral hernia containing nonobstructed bowel loop. Scattered colonic diverticulosis without evidence of diverticulitis or bowel obstruction. Bladder, ureters, uterus and adnexa normal appearance. Appendix not visualized. No mass, adenopathy, free air or free fluid. Bones demineralized. IMPRESSION: No definite acute intra abdominal abnormalities. Questionable mild wall thickening of the gastric wall versus artifact from underdistention ; consider followup endoscopy or upper GI assessment if patient has persistent symptoms. Electronically Signed   By: Ulyses Southward M.D.   On: 07/05/2015 19:53   Dg Chest 1 View  07/05/2015  CLINICAL DATA:  Dysphagia EXAM: CHEST 1 VIEW COMPARISON:  06/20/2015 FINDINGS: There is unchanged mild cardiomegaly. The lungs are clear. No large effusions. Normal pulmonary vasculature. Unremarkable hilar and mediastinal contours, unchanged. IMPRESSION: Cardiomegaly, unchanged.  No acute cardiopulmonary findings. Electronically Signed   By: Ellery Plunk M.D.   On: 07/05/2015 22:29   Dg Chest 2 View  06/20/2015  CLINICAL DATA:  Shortness of breath and cough EXAM: CHEST  2 VIEW COMPARISON:  06/20/2015, 1625 hours FINDINGS: Cardiac shadow is again mildly enlarged. Lungs are well aerated bilaterally. No acute bony abnormality is seen. Stable compression deformity is noted in the lower thoracic spine. IMPRESSION: No acute abnormality noted.  No change from 2 hours previous Electronically Signed   By: Alcide Clever M.D.   On: 06/20/2015 18:42   Dg Chest 2 View  06/20/2015  CLINICAL DATA:  Pt here with cough that started last week, hx of bronchitis last year EXAM: CHEST  2 VIEW COMPARISON:  01/27/2015 FINDINGS: The heart is enlarged. No focal consolidations or pleural effusions. Stable elevation of right hemidiaphragm. Surgical clips are noted in the  upper abdomen. IMPRESSION: Stable cardiomegaly. Electronically Signed   By: Norva Pavlov M.D.   On: 06/20/2015 17:44   Ct Chest Wo Contrast  07/07/2015  CLINICAL DATA:  Shortness of breath the last few days. Unable to have contrast due to renal function Hx: HTN, Stroke, COPD, Diabetes EXAM: CT CHEST WITHOUT CONTRAST TECHNIQUE: Multidetector CT imaging of the chest was performed following the standard protocol without IV contrast. COMPARISON:  CT abdomen 01/10/2015 and earlier studies FINDINGS: Mediastinum/Lymph Nodes: Coarse coronary and aortic calcifications. No hilar or mediastinal adenopathy, sensitivity decreased without IV contrast. No pericardial effusion. Lungs/Pleura: Lungs clear.  No pleural effusion.  No pneumothorax. Upper abdomen: Cholecystectomy clips. Stable low-attenuation liver lesions since previous CT abdomen 05/21/2014 probably cysts. Musculoskeletal: T11 compression fracture deformity with at least 50% loss of height anteriorly, stable 05/21/2014. No retropulsion. No posterior element involvement. No new fracture. Sternum intact. IMPRESSION: 1. No acute abnormality. 2. Atherosclerosis, including aortic and coronary artery disease. Please note that although the presence of coronary artery calcium documents the presence of coronary artery disease, the severity of this disease and any potential stenosis cannot be assessed  on this non-gated CT examination. Assessment for potential risk factor modification, dietary therapy or pharmacologic therapy may be warranted, if clinically indicated. 3. Old L2-T11 compression deformity. Electronically Signed   By: Corlis Leak M.D.   On: 07/07/2015 13:07   Nm Pulmonary Perf And Vent  07/07/2015  CLINICAL DATA:  Short of breath EXAM: NUCLEAR MEDICINE VENTILATION - PERFUSION LUNG SCAN TECHNIQUE: Ventilation images were obtained in multiple projections using inhaled aerosol Tc-78m DTPA. Perfusion images were obtained in multiple projections after intravenous  injection of Tc-62m MAA. RADIOPHARMACEUTICALS:  29.5 mCi Technetium-38m DTPA aerosol inhalation and 4.2 mCi Technetium-73m MAA IV COMPARISON:  Chest x-ray 07/06/2015 FINDINGS: Ventilation: No focal ventilation defect. Perfusion: No wedge shaped peripheral perfusion defects to suggest acute pulmonary embolism. IMPRESSION: Normal ventilation perfusion scan. Very low probability for pulmonary embolism. Electronically Signed   By: Marlan Palau M.D.   On: 07/07/2015 12:57   Dg Chest Port 1 View  07/08/2015  CLINICAL DATA:  Shortness of breath EXAM: PORTABLE CHEST 1 VIEW COMPARISON:  07/07/2015 FINDINGS: The heart size appears normal. There is aortic atherosclerosis. No pleural effusion or edema. No airspace consolidation identified. Osteoarthritis is noted involving both glenohumeral joints. IMPRESSION: 1. No acute cardiopulmonary abnormalities. Electronically Signed   By: Signa Kell M.D.   On: 07/08/2015 18:02   Dg Chest Port 1 View  07/06/2015  CLINICAL DATA:  Dyspnea EXAM: PORTABLE CHEST 1 VIEW COMPARISON:  07/05/2015 FINDINGS: Cardiomediastinal silhouette is stable. No segmental infiltrate or pleural effusion. Central mild vascular congestion without convincing pulmonary edema. Elevation of the right hemidiaphragm again noted. IMPRESSION: No segmental infiltrate or pleural effusion. Central mild vascular congestion without convincing pulmonary edema. Elevation of the right hemidiaphragm again noted. Electronically Signed   By: Natasha Mead M.D.   On: 07/06/2015 09:58    Time Spent in minutes  35   Hayat Warbington K M.D on 07/10/2015 at 11:32 AM  Between 7am to 7pm - Pager - [phone]  After 7pm go to [email or web address] - password [facility]  [facility] -  Office  [phone]

## 2015-07-10 NOTE — Progress Notes (Signed)
Scheduled neb not given, RN at bedside assessing patient,  PRN neb will be given if needed.

## 2015-07-11 LAB — GLUCOSE, CAPILLARY
Glucose-Capillary: 142 mg/dL — ABNORMAL HIGH (ref 65–99)
Glucose-Capillary: 148 mg/dL — ABNORMAL HIGH (ref 65–99)
Glucose-Capillary: 109 mg/dL — ABNORMAL HIGH (ref 65–99)

## 2015-07-11 LAB — URINE CULTURE: Culture: 10000 — AB

## 2015-07-11 MED ORDER — METOPROLOL SUCCINATE ER 100 MG PO TB24: 100.0000 mg | ORAL_TABLET | Freq: Every day | ORAL | Status: DC

## 2015-07-11 MED ORDER — METOPROLOL TARTRATE 100 MG PO TABS
100.0000 mg | ORAL_TABLET | Freq: Two times a day (BID) | ORAL | Status: DC
Start: 2015-07-11 — End: 2015-07-11
  Administered 2015-07-11: 100 mg via ORAL
  Filled 2015-07-11: qty 1

## 2015-07-11 MED ORDER — APIXABAN 2.5 MG PO TABS: 2.5000 mg | ORAL_TABLET | Freq: Two times a day (BID) | ORAL | Status: DC

## 2015-07-11 MED ORDER — FLUCONAZOLE 40 MG/ML PO SUSR: 100.0000 mg | Freq: Every day | ORAL | Status: AC

## 2015-07-11 MED ORDER — RISPERIDONE 1 MG PO TABS: 1.0000 mg | ORAL_TABLET | Freq: Every day | ORAL | Status: DC

## 2015-07-11 MED ORDER — AMIODARONE HCL 200 MG PO TABS: 200.0000 mg | ORAL_TABLET | Freq: Every day | ORAL | Status: DC

## 2015-07-11 NOTE — Progress Notes (Signed)
CM met with pt and pt's daughter to offer choice of home health agency.  Pt and daughter of pt both agree and decline all home health services.  No other CM needs were communicated.

## 2015-07-11 NOTE — Discharge Summary (Signed)
Shannon [name], is a 82 y.o. female  DOB March 07, 1924  MRN [id number].  Admission date:  07/05/2015  Admitting Physician  Hillary Bow, DO  Discharge Date:  07/11/2015   Primary MD  Kirt Boys, DO  Recommendations for primary care physician for things to follow:    Check CBC, BMP in a week, repeat TSH in 2 weeks and adjust Synthroid dose.  Needs outpatient cardiology and GI follow-up along with endocrine follow-up one time.   Admission Diagnosis  Dysphagia [R13.10] Abdominal pain [R10.9] Chest pain [R07.9]   Discharge Diagnosis  Dysphagia [R13.10] Abdominal pain [R10.9] Chest pain [R07.9]     Principal Problem:   Dysphagia Active Problems:   CVA (cerebral vascular accident) (HCC)   CKD (chronic kidney disease) stage 3, GFR 30-59 ml/min   Chest pain   Respiratory distress   Atrial flutter with rapid ventricular response (HCC)   Acute renal failure superimposed on stage 3 chronic kidney disease (HCC)   Encounter for long-term (current) use of other high-risk medications   Dyspnea   SOB (shortness of breath)   Esophageal candidiasis (HCC)   Goals of care, counseling/discussion   Encounter for hospice care discussion      Past Medical History  Diagnosis Date  . Pneumonia   . Hyperlipidemia   . Hypertension   . Small bowel obstruction (HCC)     "I don't know how many" (01/11/2015)  . Ventral hernia with bowel obstruction   . Asthma   . Hyperlipidemia   . Stroke (HCC)     "light one"  . COPD (chronic obstructive pulmonary disease) (HCC)   . Hypothyroidism   . Type II diabetes mellitus (HCC)   . Anemia     previous blood transfusions  . GERD (gastroesophageal reflux disease)   . History of stomach ulcers   . Arthritis     "all over"  . Renal insufficiency   . Depression     "light case"    . SIADH (syndrome of inappropriate ADH production) (HCC)     [name] 01/10/2015    Past Surgical History  Procedure Laterality Date  . Tubal ligation    . Colectomy    . Hernia repair  2015  . Esophagogastroduodenoscopy N/A 01/19/2014    Procedure: ESOPHAGOGASTRODUODENOSCOPY (EGD);  Surgeon: Hilarie Fredrickson, MD;  Location: [facility];  Service: Endoscopy;  Laterality: N/A;  . Esophagogastroduodenoscopy N/A 01/20/2014    Procedure: ESOPHAGOGASTRODUODENOSCOPY (EGD);  Surgeon: Hilarie Fredrickson, MD;  Location: [facility];  Service: Endoscopy;  Laterality: N/A;  . Esophagogastroduodenoscopy N/A 03/19/2014    Procedure: ESOPHAGOGASTRODUODENOSCOPY (EGD);  Surgeon: Rachael Fee, MD;  Location: [facility];  Service: Endoscopy;  Laterality: N/A;  . Cholecystectomy open    . Ventral hernia repair  2015    incarcerated ventral hernia ([facility] 09/2013)/notes 01/10/2015  . Cataract extraction w/ intraocular lens  implant, bilateral    . Gastrojejunostomy      hx/notes 01/10/2015  . Laparotomy N/A 01/20/2015    Procedure: EXPLORATORY LAPAROTOMY;  Surgeon:  Abigail Miyamoto, MD;  Location: [facility] OR;  Service: General;  Laterality: N/A;  . Lysis of adhesion N/A 01/20/2015    Procedure: LYSIS OF ADHESIONS < 1 HOUR;  Surgeon: Abigail Miyamoto, MD;  Location: [facility] OR;  Service: General;  Laterality: N/A;       HPI  from the history and physical done on the day of admission:    Hedy Siegrist is a 82 y.o. female who presents to the ED with c/o feeling of food getting stuck when swallowing. Patient has been on pureed diet ever since her last operation in October. Patient has history of perforated gastric ulcer in 2015, lysis of adhesions due to SBO in October 2016. EDP spoke with Dr. Adela Lank with GI, EGD Is planned, however patient became extremely tachypneic and developed respiratory alkalosis, she was found to be severely hyperthyroid with extremely low TSH. Further workup as below.     Hospital Course:      1. Acute Hypocapnic respiratory failure with respiratory alkalosis and tachypnea -   Due to combination of hyperthyroidism, GI discomfort due to esophagitis along with anxiety, in turn causing compensatory drop in bicarbonate, mildly elevated lactate due to work of breathing and dehydration from decreased oral intake due to esophagitis.   Treated her tachypnea with holding Synthroid for hypothyroidism, Risperdal and low-dose benzodiazepine for anxiety, and PPI along with antifungal medication for esophagitis. Respiratory alkalosis much improved and almost completely resolved clinically, she is now breathing less than 15 a minute, Bicarbonate levels normal.  She was seen by cardiology and pulmonary, VQ scan unremarkable, noncontrast CT chest unremarkable, no evidence of frank fluid overload or infiltrate. With above treatment now back to baseline. Almost completely off oxygen.    2. A. fib RVR. Paroxysmal. [place] vasc 2 score of greater than 5. Cardiology following, currently in sinus, on amiodarone and will switch diltiazem to Lopressor (as she has ongoing hyperthyroidism), goal is rate controlled . Echo stable with preserved EF of 60%, chronic diastolic dysfunction and no wall motion abnormality, TSH was low and Synthroid held, have placed her on Eliquis. Follow with cardiology in 1-2 weeks post discharge.  3. NSTEMI. Likely due to demand ischemia from #1 and 2 above, pain free, cardiology on board, Echo stable. Continue on beta blocker, continue anti-coordination with Eliquis.   4. Iatrogenic hyperthyroidism. Synthroid on hold. Resume low-dose Synthroid once TSH is within normal range. Request PCP to recheck TSH in 2 weeks.  5. Dysphagia with GERD. GI following, he underwent EGD on 07/08/2015 showing candidiasis esophagitis along with pyloric stenosis. Placed on Diflucan and statin swish and swallow. Patient is better. On dysphagia 1 diet. Continue to monitor. Clinically improved. Continue PPI.  Will be discharged on dysphagia1 diet with outpatient therapy therapy home follow-up, will also follow with GI one time outpatient.  6. Hypertension.  On Lopressor, hydralazine and Norvasc. PCP to monitor.  7. History of asthma/COPD. No wheezing, steroids stopped.        Follow UP  Follow-up Information    Follow up with Kirt Boys, DO. Schedule an appointment as soon as possible for a visit in 5 days.   Specialty:  Internal Medicine   Contact information:   [address] [place] [address] [phone]       Follow up with Lars Masson, MD. Schedule an appointment as soon as possible for a visit in 1 week.   Specialty:  Cardiology   Contact information:   [address] [place]  [place] [address] [phone]       Follow up with Romero Belling, MD. Schedule an appointment as soon as possible for a visit in 1 week.   Specialty:  Endocrinology   Contact information:   [address] [place] [address] [phone]       Follow up with Charlie Pitter III, MD. Schedule an appointment as soon as possible for a visit in 1 week.   Specialty:  Gastroenterology   Contact information:   [address] [place] [address] [place] [address] [phone]        Consults obtained -  Cards, Pulm, GI, Pall care  Discharge Condition: Stable  Diet and Activity recommendation: See Discharge Instructions below  Discharge Instructions       Discharge Instructions    Discharge instructions    Complete by:  As directed   Follow with Primary MD Kirt Boys, DO in 3 days   Get CBC, CMP, 2 view Chest X ray checked  by Primary MD next visit. TSH check in 2 weeks   Activity: As tolerated with Full fall precautions use walker/cane & assistance as needed   Disposition Home     Diet:  Dysphagia 1 diet with feeding assistance and aspiration precautions.  For Heart failure patients - Check your Weight same time everyday, if you gain over 2  pounds, or you develop in leg swelling, experience more shortness of breath or chest pain, call your Primary MD immediately. Follow Cardiac Low Salt Diet and 1.5 lit/day fluid restriction.   On your next visit with your primary care physician please Get Medicines reviewed and adjusted.   Please request your Prim.MD to go over all Hospital Tests and Procedure/Radiological results at the follow up, please get all Hospital records sent to your Prim MD by signing hospital release before you go home.   If you experience worsening of your admission symptoms, develop shortness of breath, life threatening emergency, suicidal or homicidal thoughts you must seek medical attention immediately by calling 911 or calling your MD immediately  if symptoms less severe.  You Must read complete instructions/literature along with all the possible adverse reactions/side effects for all the Medicines you take and that have been prescribed to you. Take any new Medicines after you have completely understood and accpet all the possible adverse reactions/side effects.   Do not drive, operating heavy machinery, perform activities at heights, swimming or participation in water activities or provide baby sitting services if your were admitted for syncope or siezures until you have seen by Primary MD or a Neurologist and advised to do so again.  Do not drive when taking Pain medications.    Do not take more than prescribed Pain, Sleep and Anxiety Medications  Special Instructions: If you have smoked or chewed Tobacco  in the last 2 yrs please stop smoking, stop any regular Alcohol  and or any Recreational drug use.  Wear Seat belts while driving.   Please note  You were cared for by a hospitalist during your hospital stay. If you have any questions about your discharge medications or the care you received while you were in the hospital after you are discharged, you can call the unit and asked to speak with the  hospitalist on call if the hospitalist that took care of you is not available. Once you are discharged, your primary care physician will handle any further medical issues. Please note that NO REFILLS for any discharge medications will be  authorized once you are discharged, as it is imperative that you return to your primary care physician (or establish a relationship with a primary care physician if you do not have one) for your aftercare needs so that they can reassess your need for medications and monitor your lab values.     Increase activity slowly    Complete by:  As directed              Discharge Medications       Medication List    STOP taking these medications        aspirin EC 81 MG tablet     cloNIDine 0.1 MG tablet  Commonly known as:  CATAPRES     levothyroxine 200 MCG tablet  Commonly known as:  SYNTHROID, LEVOTHROID      TAKE these medications        acetaminophen 325 MG tablet  Commonly known as:  TYLENOL  Take 325 mg by mouth every 6 (six) hours as needed for mild pain or fever.     ADVAIR DISKUS 250-50 MCG/DOSE Aepb  Generic drug:  Fluticasone-Salmeterol  INHALE 1 PUFF INTO THE LUNGS 2 (TWO) TIMES DAILY AS NEEDED FOR SHORTNESS OF BREATH     albuterol 108 (90 Base) MCG/ACT inhaler  Commonly known as:  PROVENTIL HFA;VENTOLIN HFA  Inhale 2 puffs into the lungs every 6 (six) hours as needed for wheezing or shortness of breath.     amiodarone 200 MG tablet  Commonly known as:  PACERONE  Take 1 tablet (200 mg total) by mouth daily.     amLODipine 10 MG tablet  Commonly known as:  NORVASC  TAKE 1 TABLET BY MOUTH DAILY FOR HYPERTENSION     apixaban 2.5 MG Tabs tablet  Commonly known as:  ELIQUIS  Take 1 tablet (2.5 mg total) by mouth 2 (two) times daily.     atorvastatin 10 MG tablet  Commonly known as:  LIPITOR  TAKE 1 TABLET BY MOUTH EVERY DAY     b complex vitamins tablet  Take 1 tablet by mouth daily.     calcium-vitamin D 500-200 MG-UNIT  tablet  Commonly known as:  OSCAL WITH D  Take 1 tablet by mouth daily with breakfast.     chlorhexidine 0.12 % solution  Commonly known as:  PERIDEX  15 mLs by Mouth Rinse route 2 (two) times daily.     [employer] GAS RELIEF 80 MG chewable tablet  Generic drug:  simethicone  CHEW 1 TABLET BY MOUTH 4 TIMES A DAY AS NEEDED FOR FLATULENCE     diclofenac sodium 1 % Gel  Commonly known as:  VOLTAREN  Apply 2 g topically daily as needed (for pain).     feeding supplement Liqd  Take 1 Container by mouth 3 (three) times daily between meals.     fluconazole 40 MG/ML suspension  Commonly known as:  DIFLUCAN  Take 2.5 mLs (100 mg total) by mouth daily.     fluticasone 50 MCG/ACT nasal spray  Commonly known as:  FLONASE  Place 2 sprays into both nostrils daily.     gabapentin 300 MG capsule  Commonly known as:  NEURONTIN  TAKE 1 CAPSULE (300 MG TOTAL) BY MOUTH 3 (THREE) TIMES DAILY.     guaiFENesin 600 MG 12 hr tablet  Commonly known as:  MUCINEX  Take 1 tablet (600 mg total) by mouth 2 (two) times daily.     hydrALAZINE 100 MG tablet  Commonly known as:  APRESOLINE  TAKE 1 TABLET (100 MG TOTAL) BY MOUTH 3 (THREE) TIMES DAILY.     isosorbide mononitrate 30 MG 24 hr tablet  Commonly known as:  IMDUR  Take one tablet by mouth once daily. If Chest Pains occur increase to two tablets daily.     lactulose 10 GM/15ML solution  Commonly known as:  CHRONULAC  Take 15 mLs (10 g total) by mouth daily as needed for mild constipation.     latanoprost 0.005 % ophthalmic solution  Commonly known as:  XALATAN  Place 1 drop into both eyes at bedtime.     metoprolol succinate 100 MG 24 hr tablet  Commonly known as:  TOPROL-XL  Take 1 tablet (100 mg total) by mouth daily.     multivitamin with minerals tablet  Take 1 tablet by mouth every morning.     pantoprazole 40 MG tablet  Commonly known as:  PROTONIX  TAKE 1 TABLET BY MOUTH TWICE A DAY     Polyethyl Glycol-Propyl Glycol 0.4-0.3 % Soln   Place 1 drop into both eyes 4 (four) times daily.     polyethylene glycol powder powder  Commonly known as:  GLYCOLAX/MIRALAX  Take 17 g by mouth 2 (two) times daily as needed.     RESTASIS 0.05 % ophthalmic emulsion  Generic drug:  cycloSPORINE  USE 1 DROP INTO BOTH EYES TWICE DAILY     risperiDONE 1 MG tablet  Commonly known as:  RISPERDAL  Take 1 tablet (1 mg total) by mouth daily.     sennosides-docusate sodium 8.6-50 MG tablet  Commonly known as:  SENOKOT-S  Take 1 tablet by mouth 2 (two) times daily.     sertraline 100 MG tablet  Commonly known as:  ZOLOFT  TAKE 1 TABLET BY MOUTH ONCE DAILY FOR DEPRESSION     sodium chloride 1 g tablet  TAKE 1 TABLET BY MOUTH THREE TIMES DAILY     traMADol 50 MG tablet  Commonly known as:  ULTRAM  Take 50 mg by mouth 2 (two) times daily as needed for moderate pain.        Major procedures and Radiology Reports - PLEASE review detailed and final reports for all details, in brief -   VQ scan stable  CT chest Non Contrast - non acute  TTE   Left ventricle: The cavity size was normal. Wall thickness was increased in a pattern of mild LVH. Systolic function was normal. The estimated ejection fraction was in the range of 60% to 65%. Doppler parameters are consistent with abnormal left ventricular relaxation (grade 1 diastolic dysfunction). - Aortic valve: There was mild regurgitation. - Mitral valve: There was mild regurgitation. - Left atrium: The atrium was moderately to severely dilated.   EGD - severe candida esophagitis along with pyloric stenosis.   Ct Abdomen Pelvis Wo Contrast  07/05/2015  CLINICAL DATA:  Bloating, difficulty swallowing, history small-bowel obstruction, ventral hernia, GERD, stomach ulcers, hypertension, type II diabetes mellitus, COPD, asthma EXAM: CT ABDOMEN AND PELVIS WITHOUT CONTRAST TECHNIQUE: Multidetector CT imaging of the abdomen and pelvis was performed following the standard protocol without IV  contrast. Sagittal and coronal MPR images reconstructed from axial data set. Patient drank dilute oral contrast for exam. COMPARISON:  01/10/2015 FINDINGS: Lung bases clear. Multiple low-attenuation hepatic foci likely cysts, unchanged. Gallbladder surgically absent. Remainder of liver, spleen, pancreas, kidneys, and adrenal glands normal. Extensive atherosclerotic calcifications. Questionable mild wall thickening of distal esophagus. Increased thickening of the gastric wall versus the  previous exam, nonspecific. Small ventral hernia containing nonobstructed bowel loop. Scattered colonic diverticulosis without evidence of diverticulitis or bowel obstruction. Bladder, ureters, uterus and adnexa normal appearance. Appendix not visualized. No mass, adenopathy, free air or free fluid. Bones demineralized. IMPRESSION: No definite acute intra abdominal abnormalities. Questionable mild wall thickening of the gastric wall versus artifact from underdistention ; consider followup endoscopy or upper GI assessment if patient has persistent symptoms. Electronically Signed   By: Ulyses Southward M.D.   On: 07/05/2015 19:53   Dg Chest 1 View  07/05/2015  CLINICAL DATA:  Dysphagia EXAM: CHEST 1 VIEW COMPARISON:  06/20/2015 FINDINGS: There is unchanged mild cardiomegaly. The lungs are clear. No large effusions. Normal pulmonary vasculature. Unremarkable hilar and mediastinal contours, unchanged. IMPRESSION: Cardiomegaly, unchanged.  No acute cardiopulmonary findings. Electronically Signed   By: Ellery Plunk M.D.   On: 07/05/2015 22:29   Dg Chest 2 View  06/20/2015  CLINICAL DATA:  Shortness of breath and cough EXAM: CHEST  2 VIEW COMPARISON:  06/20/2015, 1625 hours FINDINGS: Cardiac shadow is again mildly enlarged. Lungs are well aerated bilaterally. No acute bony abnormality is seen. Stable compression deformity is noted in the lower thoracic spine. IMPRESSION: No acute abnormality noted.  No change from 2 hours previous  Electronically Signed   By: Alcide Clever M.D.   On: 06/20/2015 18:42   Dg Chest 2 View  06/20/2015  CLINICAL DATA:  Pt here with cough that started last week, hx of bronchitis last year EXAM: CHEST  2 VIEW COMPARISON:  01/27/2015 FINDINGS: The heart is enlarged. No focal consolidations or pleural effusions. Stable elevation of right hemidiaphragm. Surgical clips are noted in the upper abdomen. IMPRESSION: Stable cardiomegaly. Electronically Signed   By: Norva Pavlov M.D.   On: 06/20/2015 17:44   Ct Chest Wo Contrast  07/07/2015  CLINICAL DATA:  Shortness of breath the last few days. Unable to have contrast due to renal function Hx: HTN, Stroke, COPD, Diabetes EXAM: CT CHEST WITHOUT CONTRAST TECHNIQUE: Multidetector CT imaging of the chest was performed following the standard protocol without IV contrast. COMPARISON:  CT abdomen 01/10/2015 and earlier studies FINDINGS: Mediastinum/Lymph Nodes: Coarse coronary and aortic calcifications. No hilar or mediastinal adenopathy, sensitivity decreased without IV contrast. No pericardial effusion. Lungs/Pleura: Lungs clear.  No pleural effusion.  No pneumothorax. Upper abdomen: Cholecystectomy clips. Stable low-attenuation liver lesions since previous CT abdomen 05/21/2014 probably cysts. Musculoskeletal: T11 compression fracture deformity with at least 50% loss of height anteriorly, stable 05/21/2014. No retropulsion. No posterior element involvement. No new fracture. Sternum intact. IMPRESSION: 1. No acute abnormality. 2. Atherosclerosis, including aortic and coronary artery disease. Please note that although the presence of coronary artery calcium documents the presence of coronary artery disease, the severity of this disease and any potential stenosis cannot be assessed on this non-gated CT examination. Assessment for potential risk factor modification, dietary therapy or pharmacologic therapy may be warranted, if clinically indicated. 3. Old L2-T11 compression  deformity. Electronically Signed   By: Corlis Leak M.D.   On: 07/07/2015 13:07   Nm Pulmonary Perf And Vent  07/07/2015  CLINICAL DATA:  Short of breath EXAM: NUCLEAR MEDICINE VENTILATION - PERFUSION LUNG SCAN TECHNIQUE: Ventilation images were obtained in multiple projections using inhaled aerosol Tc-48m DTPA. Perfusion images were obtained in multiple projections after intravenous injection of Tc-74m MAA. RADIOPHARMACEUTICALS:  29.5 mCi Technetium-38m DTPA aerosol inhalation and 4.2 mCi Technetium-52m MAA IV COMPARISON:  Chest x-ray 07/06/2015 FINDINGS: Ventilation: No focal ventilation defect. Perfusion: No  wedge shaped peripheral perfusion defects to suggest acute pulmonary embolism. IMPRESSION: Normal ventilation perfusion scan. Very low probability for pulmonary embolism. Electronically Signed   By: Marlan Palau M.D.   On: 07/07/2015 12:57   Dg Chest Port 1 View  07/08/2015  CLINICAL DATA:  Shortness of breath EXAM: PORTABLE CHEST 1 VIEW COMPARISON:  07/07/2015 FINDINGS: The heart size appears normal. There is aortic atherosclerosis. No pleural effusion or edema. No airspace consolidation identified. Osteoarthritis is noted involving both glenohumeral joints. IMPRESSION: 1. No acute cardiopulmonary abnormalities. Electronically Signed   By: Signa Kell M.D.   On: 07/08/2015 18:02   Dg Chest Port 1 View  07/06/2015  CLINICAL DATA:  Dyspnea EXAM: PORTABLE CHEST 1 VIEW COMPARISON:  07/05/2015 FINDINGS: Cardiomediastinal silhouette is stable. No segmental infiltrate or pleural effusion. Central mild vascular congestion without convincing pulmonary edema. Elevation of the right hemidiaphragm again noted. IMPRESSION: No segmental infiltrate or pleural effusion. Central mild vascular congestion without convincing pulmonary edema. Elevation of the right hemidiaphragm again noted. Electronically Signed   By: Natasha Mead M.D.   On: 07/06/2015 09:58    Micro Results      Recent Results (from the past 240  hour(s))  MRSA PCR Screening     Status: None   Collection Time: 07/06/15  6:24 AM  Result Value Ref Range Status   MRSA by PCR NEGATIVE NEGATIVE Final    Comment:        The GeneXpert MRSA Assay (FDA approved for NASAL specimens only), is one component of a comprehensive MRSA colonization surveillance program. It is not intended to diagnose MRSA infection nor to guide or monitor treatment for MRSA infections.   Urine culture     Status: Abnormal (Preliminary result)   Collection Time: 07/08/15  6:26 PM  Result Value Ref Range Status   Specimen Description URINE, RANDOM  Final   Special Requests NONE  Final   Culture 10,000 COLONIES/mL PROTEUS MIRABILIS (A)  Final   Report Status PENDING  Incomplete       Today   Subjective    Camarie Loye today has no headache,no chest abdominal pain,no new weakness tingling or numbness, feels much better wants to go home today.     Objective   Blood pressure 141/72, pulse 60, temperature 98.4 F (36.9 C), temperature source Axillary, resp. rate 16, height 5\' 6"  (1.676 m), weight 57.2 kg (126 lb 1.7 oz), SpO2 100 %.   Intake/Output Summary (Last 24 hours) at 07/11/15 0924 Last data filed at 07/10/15 2100  Gross per 24 hour  Intake    440 ml  Output   1300 ml  Net   -860 ml    Exam Awake Alert, Oriented x 3, No new F.N deficits, Normal affect Harris.AT,PERRAL Supple Neck,No JVD, No cervical lymphadenopathy appriciated.  Symmetrical Chest wall movement, Good air movement bilaterally, CTAB RRR,No Gallops,Rubs or new Murmurs, No Parasternal Heave +ve B.Sounds, Abd Soft, Non tender, No organomegaly appriciated, No rebound -guarding or rigidity. No Cyanosis, Clubbing or edema, No new Rash or bruise   Data Review   CBC w Diff: Lab Results  Component Value Date   WBC 7.4 07/10/2015   WBC 7.1 02/02/2015   HGB 11.7* 07/10/2015   HCT 34.2* 07/10/2015   HCT 28.5* 02/02/2015   PLT 147* 07/10/2015   PLT 404* 02/02/2015   LYMPHOPCT  20 07/05/2015   MONOPCT 11 07/05/2015   EOSPCT 3 07/05/2015   BASOPCT 0 07/05/2015    CMP: Lab Results  Component Value Date   NA 140 07/10/2015   NA 130* 06/16/2015   K 4.2 07/10/2015   CL 103 07/10/2015   CO2 25 07/10/2015   BUN 24* 07/10/2015   BUN 33 06/16/2015   CREATININE 0.98 07/10/2015   CREATININE 1.3* 05/06/2014   GLU 126 05/06/2014   PROT 7.1 07/05/2015   PROT 6.9 04/07/2015   ALBUMIN 3.8 07/05/2015   ALBUMIN 4.2 04/07/2015   BILITOT 0.9 07/05/2015   BILITOT 0.3 04/07/2015   ALKPHOS 63 07/05/2015   AST 24 07/05/2015   ALT 24 07/05/2015  .   Total Time in preparing paper work, data evaluation and todays exam - 35 minutes  Leroy Sea M.D on 07/11/2015 at 9:24 AM  [facility]   Office  [phone]

## 2015-07-11 NOTE — Progress Notes (Signed)
No signature needed on prescriptions, per Dr. Candiss Norse.  All four RX given to pt daughter at bedside.  All d/c meds/instructions given to pt and daughter.  Pt in no acute distress.  Daughter refusing all [place] services.  CM aware.

## 2015-07-12 ENCOUNTER — Encounter (HOSPITAL_COMMUNITY): Payer: Self-pay | Admitting: Gastroenterology

## 2015-07-14 ENCOUNTER — Ambulatory Visit (INDEPENDENT_AMBULATORY_CARE_PROVIDER_SITE_OTHER): Payer: Medicare Other | Admitting: Internal Medicine

## 2015-07-14 ENCOUNTER — Encounter: Payer: Self-pay | Admitting: Internal Medicine

## 2015-07-14 VITALS — BP 140/72 | HR 56 | Temp 97.3°F | Ht 66.0 in | Wt 138.0 lb

## 2015-07-14 DIAGNOSIS — E038 Other specified hypothyroidism: Secondary | ICD-10-CM

## 2015-07-14 DIAGNOSIS — I4892 Unspecified atrial flutter: Secondary | ICD-10-CM

## 2015-07-14 DIAGNOSIS — E1151 Type 2 diabetes mellitus with diabetic peripheral angiopathy without gangrene: Secondary | ICD-10-CM | POA: Diagnosis not present

## 2015-07-14 DIAGNOSIS — D638 Anemia in other chronic diseases classified elsewhere: Secondary | ICD-10-CM | POA: Diagnosis not present

## 2015-07-14 DIAGNOSIS — N179 Acute kidney failure, unspecified: Secondary | ICD-10-CM | POA: Diagnosis not present

## 2015-07-14 DIAGNOSIS — E1165 Type 2 diabetes mellitus with hyperglycemia: Secondary | ICD-10-CM

## 2015-07-14 DIAGNOSIS — N183 Chronic kidney disease, stage 3 unspecified: Secondary | ICD-10-CM

## 2015-07-14 DIAGNOSIS — I10 Essential (primary) hypertension: Secondary | ICD-10-CM

## 2015-07-14 DIAGNOSIS — B3781 Candidal esophagitis: Secondary | ICD-10-CM

## 2015-07-14 DIAGNOSIS — IMO0002 Reserved for concepts with insufficient information to code with codable children: Secondary | ICD-10-CM

## 2015-07-14 MED ORDER — APIXABAN 2.5 MG PO TABS: ORAL_TABLET | ORAL | Status: DC

## 2015-07-14 NOTE — Progress Notes (Signed)
Patient ID: Shannon [name], female   DOB: 12-13-1923, 82 y.o.   MRN: [id number]    Facility  [facility]    Place of Service:   OFFICE    No Known Allergies  Chief Complaint  Patient presents with  . Hospitalization Follow-up    was in hospital 07/05/15 to 07/11/15 for dysphagia . Here with daughter Jenean Lindau    HPI:  Hospitalized for 317 through 07/11/2015.   Patient had acute hypercapnic respiratory failure with respiratory alkalosis and tachypnea. Synthroid was held due to iatrogenic hyperthyroidism. Risperdal unknown dose benzodiazepine was given for anxiety.  NSTEMI was diagnosed. It was felt this is likely due to demand ischemia from hyperthyroidism and hypercapnic respiratory failure.  Atrial fibrillation rapid ventricular response was noted. Diltiazem was switched to Lopressor. 2-D echo showed ejection fraction stable at 60%, chronic diastolic dysfunction, but no wall motion abnormality. She was placed on Eliquis. She was to follow-up with cardiology in 1-2 weeks postdischarge.  Patient had dysphagia, abdominal pain, chest pain.  Patient underwent EGD on 07/08/2015 which showed esophageal candidiasis and pyloric stenosis. She was placed on Diflucan and nystatin swish and swallow. She improved on this regimen. She was advised to continue her PPI.  Hypertension was controlled on Lopressor, hydralazine, and Norvasc.  Other diagnoses included prior CVA, CKD 3, respiratory distress, atrial flutter with rapid ventricular response, acute renal failure superimposed on CKD 3, dyspnea, esophageal candidiasis. Use also encounter for hospice care.  Recommendations were made for follow-up CBC, BMP, and TSH. It was felt that she would need resumption of her levothyroxine based on the results of the TSH.  Medications: Patient's Medications  New Prescriptions   No medications on file  Previous Medications   ACETAMINOPHEN (TYLENOL) 325 MG TABLET    Take 325 mg by mouth every 6 (six) hours as needed for  mild pain or fever.    ADVAIR DISKUS 250-50 MCG/DOSE AEPB    INHALE 1 PUFF INTO THE LUNGS 2 (TWO) TIMES DAILY AS NEEDED FOR SHORTNESS OF BREATH   ALBUTEROL (PROVENTIL HFA;VENTOLIN HFA) 108 (90 BASE) MCG/ACT INHALER    Inhale 2 puffs into the lungs every 6 (six) hours as needed for wheezing or shortness of breath.   AMIODARONE (PACERONE) 200 MG TABLET    Take 1 tablet (200 mg total) by mouth daily.   AMLODIPINE (NORVASC) 10 MG TABLET    TAKE 1 TABLET BY MOUTH DAILY FOR HYPERTENSION   APIXABAN (ELIQUIS) 2.5 MG TABS TABLET    Take 1 tablet (2.5 mg total) by mouth 2 (two) times daily.   ATORVASTATIN (LIPITOR) 10 MG TABLET    TAKE 1 TABLET BY MOUTH EVERY DAY   B COMPLEX VITAMINS TABLET    Take 1 tablet by mouth daily.    CALCIUM-VITAMIN D (OSCAL WITH D) 500-200 MG-UNIT PER TABLET    Take 1 tablet by mouth daily with breakfast.    CHLORHEXIDINE (PERIDEX) 0.12 % SOLUTION    15 mLs by Mouth Rinse route 2 (two) times daily.   [employer] GAS RELIEF 80 MG CHEWABLE TABLET    CHEW 1 TABLET BY MOUTH 4 TIMES A DAY AS NEEDED FOR FLATULENCE   DICLOFENAC SODIUM (VOLTAREN) 1 % GEL    Apply 2 g topically daily as needed (for pain).    FEEDING SUPPLEMENT (BOOST / RESOURCE BREEZE) LIQD    Take 1 Container by mouth 3 (three) times daily between meals.   FLUCONAZOLE (DIFLUCAN) 40 MG/ML SUSPENSION    Take 2.5 mLs (100 mg total)  by mouth daily.   FLUTICASONE (FLONASE) 50 MCG/ACT NASAL SPRAY    Place 2 sprays into both nostrils daily.   GABAPENTIN (NEURONTIN) 300 MG CAPSULE    TAKE 1 CAPSULE (300 MG TOTAL) BY MOUTH 3 (THREE) TIMES DAILY.   GUAIFENESIN (MUCINEX) 600 MG 12 HR TABLET    Take 1 tablet (600 mg total) by mouth 2 (two) times daily.   HYDRALAZINE (APRESOLINE) 100 MG TABLET    TAKE 1 TABLET (100 MG TOTAL) BY MOUTH 3 (THREE) TIMES DAILY.   ISOSORBIDE MONONITRATE (IMDUR) 30 MG 24 HR TABLET    Take one tablet by mouth once daily. If Chest Pains occur increase to two tablets daily.   LACTULOSE (CHRONULAC) 10 GM/15ML SOLUTION     Take 15 mLs (10 g total) by mouth daily as needed for mild constipation.   LATANOPROST (XALATAN) 0.005 % OPHTHALMIC SOLUTION    Place 1 drop into both eyes at bedtime.   METOPROLOL SUCCINATE (TOPROL-XL) 100 MG 24 HR TABLET    Take 1 tablet (100 mg total) by mouth daily.   MULTIPLE VITAMINS-MINERALS (MULTIVITAMIN WITH MINERALS) TABLET    Take 1 tablet by mouth every morning.    PANTOPRAZOLE (PROTONIX) 40 MG TABLET    TAKE 1 TABLET BY MOUTH TWICE A DAY   POLYETHYL GLYCOL-PROPYL GLYCOL 0.4-0.3 % SOLN    Place 1 drop into both eyes 4 (four) times daily.    POLYETHYLENE GLYCOL POWDER (GLYCOLAX/MIRALAX) POWDER    Take 17 g by mouth 2 (two) times daily as needed.   RESTASIS 0.05 % OPHTHALMIC EMULSION    USE 1 DROP INTO BOTH EYES TWICE DAILY   RISPERIDONE (RISPERDAL) 1 MG TABLET    Take 1 tablet (1 mg total) by mouth daily.   SENNOSIDES-DOCUSATE SODIUM (SENOKOT-S) 8.6-50 MG TABLET    Take 1 tablet by mouth 2 (two) times daily.    SERTRALINE (ZOLOFT) 100 MG TABLET    TAKE 1 TABLET BY MOUTH ONCE DAILY FOR DEPRESSION   SODIUM CHLORIDE 1 G TABLET    TAKE 1 TABLET BY MOUTH THREE TIMES DAILY   TRAMADOL (ULTRAM) 50 MG TABLET    Take 50 mg by mouth 2 (two) times daily as needed for moderate pain.   Modified Medications   No medications on file  Discontinued Medications   No medications on file    Review of Systems  Constitutional: Positive for fatigue.  Respiratory: Positive for chest tightness and shortness of breath.   Musculoskeletal: Positive for joint swelling, arthralgias, gait problem and neck pain. Negative for neck stiffness.  Neurological: Negative for seizures and weakness.  Psychiatric/Behavioral: Positive for sleep disturbance and dysphoric mood.  All other systems reviewed and are negative.   Filed Vitals:   07/14/15 1400  BP: 140/72  Pulse: 56  Temp: 97.3 F (36.3 C)  TempSrc: Oral  Height: 5\' 6"  (1.676 m)  Weight: 138 lb (62.596 kg)  SpO2: 96%   Body mass index is 22.28  kg/(m^2). Filed Weights   07/14/15 1400  Weight: 138 lb (62.596 kg)     Physical Exam  Constitutional: She is oriented to person, place, and time. She appears well-developed and well-nourished.  HENT:  Mouth/Throat: Oropharynx is clear and moist. No oropharyngeal exudate.  Mild discomfort left side of the neck palpation  Eyes: Pupils are equal, round, and reactive to light. No scleral icterus.  Neck: Neck supple. Muscular tenderness present. No spinous process tenderness present. Carotid bruit is not present. No rigidity. No tracheal deviation,  no edema, no erythema and normal range of motion present. No thyromegaly present.    Reduced neck ROM  Cardiovascular: Normal rate, regular rhythm and intact distal pulses.  Exam reveals no gallop and no friction rub.   Murmur (1/6 SEM) heard. No LE edema b/l. no calf TTP.   Pulmonary/Chest: Effort normal. No stridor. No respiratory distress. She has wheezes (end expiratory with prolonged expiration). She has no rales.  Abdominal: Soft. Bowel sounds are normal. She exhibits no distension and no mass. There is no hepatomegaly. There is no tenderness. There is no rebound and no guarding.  Musculoskeletal: She exhibits edema and tenderness.  Lymphadenopathy:    She has no cervical adenopathy.  Neurological: She is alert and oriented to person, place, and time.  Skin: Skin is warm and dry. No rash noted.  Psychiatric: She has a normal mood and affect. Her behavior is normal. Judgment and thought content normal.    Labs reviewed: Lab Summary Latest Ref Rng 07/10/2015 07/09/2015 07/08/2015 07/07/2015  Hemoglobin 12.0 - 15.0 g/dL 11.7(L) 10.9(L) 12.0 12.5  Hematocrit 36.0 - 46.0 % 34.2(L) 32.6(L) 35.9(L) 35.4(L)  White count 4.0 - 10.5 K/uL 7.4 15.0(H) 17.0(H) 7.0  Platelet count 150 - 400 K/uL 147(L) 176 208 248  Sodium 135 - 145 mmol/L 140 140 139 140  Potassium 3.5 - 5.1 mmol/L 4.2 3.4(L) 3.7 4.2  Calcium 8.9 - 10.3 mg/dL 9.0 9.0 9.6 9.1  Phosphorus  - (None) (None) (None) (None)  Creatinine 0.44 - 1.00 mg/dL 8.65 7.84(O) 9.62(X) 5.28(U)  AST - (None) (None) (None) (None)  Alk Phos - (None) (None) (None) (None)  Bilirubin - (None) (None) (None) (None)  Glucose 65 - 99 mg/dL 132(G) 401(U) 272(Z) 366(Y)  Cholesterol - (None) (None) (None) (None)  HDL cholesterol - (None) (None) (None) (None)  Triglycerides - (None) (None) (None) (None)  LDL Direct - (None) (None) (None) (None)  LDL Calc - (None) (None) (None) (None)  Total protein - (None) (None) (None) (None)  Albumin - (None) (None) (None) (None)   Lab Results  Component Value Date   TSH 0.113* 07/10/2015   TSH 0.029* 07/06/2015   TSH 0.009* 06/16/2015   Lab Results  Component Value Date   BUN 24* 07/10/2015   BUN 30* 07/09/2015   BUN 33* 07/08/2015   Lab Results  Component Value Date   HGBA1C 6.4* 04/07/2015   HGBA1C 6.4* 11/02/2014   HGBA1C 6.9* 12/24/2013    Assessment/Plan  1. DM (diabetes mellitus) type II uncontrolled, periph vascular disorder (HCC) - CMP  2. Other specified hypothyroidism - TSH  3. Acute renal failure superimposed on stage 3 chronic kidney disease (HCC) - CMP  4. Anemia of chronic disease - CBC With Differential  5. Esophageal candidiasis (HCC) Resolved  6. Essential hypertension, benign Controlled  7. Atrial flutter with rapid ventricular response (HCC) - apixaban (ELIQUIS) 2.5 MG TABS tablet; Take one twice daily for anticoagulation to prevent stroke.  Dispense: 60 tablet; Refill: 11

## 2015-07-15 LAB — CBC WITH DIFFERENTIAL
Basophils Absolute: 0 10*3/uL (ref 0.0–0.2)
Basos: 0 %
EOS (ABSOLUTE): 0.2 10*3/uL (ref 0.0–0.4)
Eos: 3 %
Hematocrit: 32.4 % — ABNORMAL LOW (ref 34.0–46.6)
Hemoglobin: 10.9 g/dL — ABNORMAL LOW (ref 11.1–15.9)
Immature Grans (Abs): 0 10*3/uL (ref 0.0–0.1)
Immature Granulocytes: 1 %
Lymphocytes Absolute: 1.3 10*3/uL (ref 0.7–3.1)
Lymphs: 21 %
MCH: 31.4 pg (ref 26.6–33.0)
MCHC: 33.6 g/dL (ref 31.5–35.7)
MCV: 93 fL (ref 79–97)
Monocytes Absolute: 0.6 10*3/uL (ref 0.1–0.9)
Monocytes: 10 %
Neutrophils Absolute: 4 10*3/uL (ref 1.4–7.0)
Neutrophils: 65 %
RBC: 3.47 x10E6/uL — ABNORMAL LOW (ref 3.77–5.28)
RDW: 16.5 % — ABNORMAL HIGH (ref 12.3–15.4)
WBC: 6.1 10*3/uL (ref 3.4–10.8)

## 2015-07-15 LAB — TSH: TSH: 4.26 u[IU]/mL (ref 0.450–4.500)

## 2015-07-15 LAB — COMPREHENSIVE METABOLIC PANEL
ALT: 16 IU/L (ref 0–32)
AST: 19 IU/L (ref 0–40)
Albumin/Globulin Ratio: 1.8 (ref 1.2–2.2)
Albumin: 3.7 g/dL (ref 3.2–4.6)
Alkaline Phosphatase: 60 IU/L (ref 39–117)
BUN/Creatinine Ratio: 22 (ref 12–28)
BUN: 27 mg/dL (ref 10–36)
Bilirubin Total: 0.3 mg/dL (ref 0.0–1.2)
CO2: 24 mmol/L (ref 18–29)
Calcium: 9.4 mg/dL (ref 8.7–10.3)
Chloride: 101 mmol/L (ref 96–106)
Creatinine, Ser: 1.24 mg/dL — ABNORMAL HIGH (ref 0.57–1.00)
GFR calc Af Amer: 44 mL/min/{1.73_m2} — ABNORMAL LOW (ref >59)
GFR calc non Af Amer: 38 mL/min/{1.73_m2} — ABNORMAL LOW (ref >59)
Globulin, Total: 2.1 g/dL (ref 1.5–4.5)
Glucose: 132 mg/dL — ABNORMAL HIGH (ref 65–99)
Potassium: 4.5 mmol/L (ref 3.5–5.2)
Sodium: 140 mmol/L (ref 134–144)
Total Protein: 5.8 g/dL — ABNORMAL LOW (ref 6.0–8.5)

## 2015-07-20 ENCOUNTER — Telehealth: Payer: Self-pay

## 2015-07-20 NOTE — Telephone Encounter (Signed)
Patients daughter called to ask what her mother's lab results were and to find out if she should be taking her thyroid medication Please Advise

## 2015-07-20 NOTE — Telephone Encounter (Signed)
These labs were ordered by Dr Nyoka Cowden

## 2015-07-21 ENCOUNTER — Telehealth: Payer: Self-pay | Admitting: *Deleted

## 2015-07-21 ENCOUNTER — Encounter: Payer: Self-pay | Admitting: Internal Medicine

## 2015-07-21 ENCOUNTER — Ambulatory Visit (INDEPENDENT_AMBULATORY_CARE_PROVIDER_SITE_OTHER): Payer: Medicare Other | Admitting: Internal Medicine

## 2015-07-21 ENCOUNTER — Other Ambulatory Visit: Payer: Self-pay | Admitting: *Deleted

## 2015-07-21 ENCOUNTER — Ambulatory Visit: Payer: Medicare Other | Admitting: Internal Medicine

## 2015-07-21 ENCOUNTER — Telehealth: Payer: Self-pay | Admitting: Internal Medicine

## 2015-07-21 VITALS — BP 146/66 | HR 59 | Temp 97.4°F | Resp 20 | Ht 66.0 in | Wt 145.4 lb

## 2015-07-21 DIAGNOSIS — B3781 Candidal esophagitis: Secondary | ICD-10-CM

## 2015-07-21 DIAGNOSIS — R635 Abnormal weight gain: Secondary | ICD-10-CM

## 2015-07-21 DIAGNOSIS — E039 Hypothyroidism, unspecified: Secondary | ICD-10-CM

## 2015-07-21 DIAGNOSIS — K311 Adult hypertrophic pyloric stenosis: Secondary | ICD-10-CM

## 2015-07-21 DIAGNOSIS — I482 Chronic atrial fibrillation, unspecified: Secondary | ICD-10-CM

## 2015-07-21 DIAGNOSIS — R600 Localized edema: Secondary | ICD-10-CM

## 2015-07-21 DIAGNOSIS — R140 Abdominal distension (gaseous): Secondary | ICD-10-CM

## 2015-07-21 DIAGNOSIS — I10 Essential (primary) hypertension: Secondary | ICD-10-CM

## 2015-07-21 MED ORDER — LEVOTHYROXINE SODIUM 25 MCG PO TABS: 25.0000 ug | ORAL_TABLET | Freq: Every day | ORAL | Status: DC

## 2015-07-21 NOTE — Telephone Encounter (Signed)
FYI, Several attempts have been made to get patient to go for walk in X-ray for Cervical Spine 2 or 3 views, including mailing a letter to patient. With no response from patient or family.

## 2015-07-21 NOTE — Progress Notes (Signed)
Patient ID: Shannon [name], female   DOB: 10/05/1923, 82 y.o.   MRN: [id number]    Location:     [facility]   Place of Service:   OFFICE   Chief Complaint  Patient presents with  . Acute Visit    Patients c/o Has swelling in legs and fullness in her stomach    HPI:  82 yo female seen today for bloating and "feels full", decreased appetite and increased flatulence. She had 1 episode of emesis last weekend unrelated to meals. (+) nausea. She takes OTC gas x and PPI. She is scheduled to see GI in the AM  She was recently admitted to the hospital with afib w RVR, dysphagia due to esophageal candidiasis and NSTEMI. Synthroid stopped due to iatrogenic hyperthyroidism. Repeat TSH in office 4.260. She was Rx fluconazole 100mg  susp daily x 21 days.  EGD showed severe candida esophagitis with pyloric stenosis. She is on eliquis and amiodarone and will see cardio later this week  She has seasonal allergy sx's and takes zyrtec daily and has flonase. She takes advair for asthma  She does not take any medications for diabetes. She has numbness or tingling in feet. She would like to see podiatry due to foot deformities. She has been going to [place] for foot care.  No seizures since last OV. Takes gabapentin  HTN/afib/hyperlipidemia - BP controlled on meds. Cholesterol stable on lipitor. Rate controlled on metoprolol succ  GI told her to f/u prn. She is still doing pureed diet. No dysphagia or dyspesia. Takes PPI BID. Constipation stable on lactulose  Hyponatremia - she was taking salt tabs but over the last several weeks has noticed the entire tab in her stool intermittently. Na 139 about 2 weeks ago  DM - controlled. A1c 6.4%. Takes gabapentin for neuropathy  Hx CVA - stable. Takes ASA daily  Depression - mood stable on sertraline  Past Medical History  Diagnosis Date  . Pneumonia   . Hyperlipidemia   . Hypertension   . Small bowel obstruction (HCC)     "I don't know how many" (01/11/2015)  .  Ventral hernia with bowel obstruction   . Asthma   . Hyperlipidemia   . Stroke (HCC)     "light one"  . COPD (chronic obstructive pulmonary disease) (HCC)   . Hypothyroidism   . Type II diabetes mellitus (HCC)   . Anemia     previous blood transfusions  . GERD (gastroesophageal reflux disease)   . History of stomach ulcers   . Arthritis     "all over"  . Renal insufficiency   . Depression     "light case"  . SIADH (syndrome of inappropriate ADH production) (HCC)     Shannon [name] 01/10/2015  . Perforated gastric ulcer (HCC)   . Gastric stenosis   . Paraesophageal hernia     Past Surgical History  Procedure Laterality Date  . Tubal ligation    . Colectomy    . Hernia repair  2015  . Esophagogastroduodenoscopy N/A 01/19/2014    Procedure: ESOPHAGOGASTRODUODENOSCOPY (EGD);  Surgeon: Hilarie Fredrickson, MD;  Location: [facility];  Service: Endoscopy;  Laterality: N/A;  . Esophagogastroduodenoscopy N/A 01/20/2014    Procedure: ESOPHAGOGASTRODUODENOSCOPY (EGD);  Surgeon: Hilarie Fredrickson, MD;  Location: [facility];  Service: Endoscopy;  Laterality: N/A;  . Esophagogastroduodenoscopy N/A 03/19/2014    Procedure: ESOPHAGOGASTRODUODENOSCOPY (EGD);  Surgeon: Rachael Fee, MD;  Location: [facility];  Service: Endoscopy;  Laterality: N/A;  . Cholecystectomy open    .  Ventral hernia repair  2015    incarcerated ventral hernia ([facility] 09/2013)/notes 01/10/2015  . Cataract extraction w/ intraocular lens  implant, bilateral    . Gastrojejunostomy      hx/notes 01/10/2015  . Laparotomy N/A 01/20/2015    Procedure: EXPLORATORY LAPAROTOMY;  Surgeon: Abigail Miyamoto, MD;  [facility];  Service: General;  Laterality: N/A;  . Lysis of adhesion N/A 01/20/2015    Procedure: LYSIS OF ADHESIONS < 1 HOUR;  Surgeon: Abigail Miyamoto, MD;  Location: [facility] OR;  Service: General;  Laterality: N/A;  . Esophagogastroduodenoscopy N/A 07/08/2015    Procedure: ESOPHAGOGASTRODUODENOSCOPY (EGD);  Surgeon: Sherrilyn Rist, MD;  Location: [facility] ENDOSCOPY;  Service: Endoscopy;  Laterality: N/A;    Patient Care Team: Kirt Boys, DO as PCP - General (Internal Medicine) Jason Coop, MD as Referring Physician (Cardiology) Carole Binning, MD as Referring Physician (Ophthalmology)  Social History   Social History  . Marital Status: Widowed    Spouse Name: N/A  . Number of Children: N/A  . Years of Education: N/A   Occupational History  . Not on file.   Social History Main Topics  . Smoking status: Never Smoker   . Smokeless tobacco: Former Neurosurgeon    Types: Snuff  . Alcohol Use: No  . Drug Use: No  . Sexual Activity: No   Other Topics Concern  . Not on file   Social History Narrative   Married in [place], lives in one story house with 2 people and no pets   Occupation: ?   Has a living Will, [place], and doesn't have DNR   Was living with Husband (in frail health) in [place] until 09/2013.  After her [facility] admission they both came to live with daughter in [place]     reports that she has never smoked. She has quit using smokeless tobacco. Her smokeless tobacco use included Snuff. She reports that she does not drink alcohol or use illicit drugs.  No Known Allergies  Medications: Patient's Medications  New Prescriptions   No medications on file  Previous Medications   ACETAMINOPHEN (TYLENOL) 325 MG TABLET    Take 325 mg by mouth every 6 (six) hours as needed for mild pain or fever.    ADVAIR DISKUS 250-50 MCG/DOSE AEPB    INHALE 1 PUFF INTO THE LUNGS 2 (TWO) TIMES DAILY AS NEEDED FOR SHORTNESS OF BREATH   ALBUTEROL (PROVENTIL HFA;VENTOLIN HFA) 108 (90 BASE) MCG/ACT INHALER    Inhale 2 puffs into the lungs every 6 (six) hours as needed for wheezing or shortness of breath.   AMIODARONE (PACERONE) 200 MG TABLET    Take 1 tablet (200 mg total) by mouth daily.   AMLODIPINE (NORVASC) 10 MG TABLET    TAKE 1 TABLET BY MOUTH DAILY FOR HYPERTENSION   APIXABAN (ELIQUIS) 2.5 MG TABS  TABLET    Take one twice daily for anticoagulation to prevent stroke.   ATORVASTATIN (LIPITOR) 10 MG TABLET    TAKE 1 TABLET BY MOUTH EVERY DAY   B COMPLEX VITAMINS TABLET    Take 1 tablet by mouth daily.    CALCIUM-VITAMIN D (OSCAL WITH D) 500-200 MG-UNIT PER TABLET    Take 1 tablet by mouth daily with breakfast.    CHLORHEXIDINE (PERIDEX) 0.12 % SOLUTION    15 mLs by Mouth Rinse route 2 (two) times daily.   [employer] GAS RELIEF 80 MG CHEWABLE TABLET    CHEW 1 TABLET BY MOUTH 4 TIMES A DAY  AS NEEDED FOR FLATULENCE   DICLOFENAC SODIUM (VOLTAREN) 1 % GEL    Apply 2 g topically daily as needed (for pain).    FEEDING SUPPLEMENT (BOOST / RESOURCE BREEZE) LIQD    Take 1 Container by mouth 3 (three) times daily between meals.   FLUTICASONE (FLONASE) 50 MCG/ACT NASAL SPRAY    Place 2 sprays into both nostrils daily.   GABAPENTIN (NEURONTIN) 300 MG CAPSULE    TAKE 1 CAPSULE (300 MG TOTAL) BY MOUTH 3 (THREE) TIMES DAILY.   HYDRALAZINE (APRESOLINE) 100 MG TABLET    TAKE 1 TABLET (100 MG TOTAL) BY MOUTH 3 (THREE) TIMES DAILY.   ISOSORBIDE MONONITRATE (IMDUR) 30 MG 24 HR TABLET    Take one tablet by mouth once daily. If Chest Pains occur increase to two tablets daily.   LACTULOSE (CHRONULAC) 10 GM/15ML SOLUTION    Take 15 mLs (10 g total) by mouth daily as needed for mild constipation.   LATANOPROST (XALATAN) 0.005 % OPHTHALMIC SOLUTION    Place 1 drop into both eyes at bedtime.   METOPROLOL SUCCINATE (TOPROL-XL) 100 MG 24 HR TABLET    Take 1 tablet (100 mg total) by mouth daily.   MULTIPLE VITAMINS-MINERALS (MULTIVITAMIN WITH MINERALS) TABLET    Take 1 tablet by mouth every morning.    PANTOPRAZOLE (PROTONIX) 40 MG TABLET    TAKE 1 TABLET BY MOUTH TWICE A DAY   POLYETHYL GLYCOL-PROPYL GLYCOL 0.4-0.3 % SOLN    Place 1 drop into both eyes 4 (four) times daily.    POLYETHYLENE GLYCOL POWDER (GLYCOLAX/MIRALAX) POWDER    Take 17 g by mouth 2 (two) times daily as needed.   RESTASIS 0.05 % OPHTHALMIC EMULSION    USE 1  DROP INTO BOTH EYES TWICE DAILY   RISPERIDONE (RISPERDAL) 1 MG TABLET    Take 1 tablet (1 mg total) by mouth daily.   SENNOSIDES-DOCUSATE SODIUM (SENOKOT-S) 8.6-50 MG TABLET    Take 1 tablet by mouth 2 (two) times daily.    SERTRALINE (ZOLOFT) 100 MG TABLET    TAKE 1 TABLET BY MOUTH ONCE DAILY FOR DEPRESSION   SODIUM CHLORIDE 1 G TABLET    TAKE 1 TABLET BY MOUTH THREE TIMES DAILY   TRAMADOL (ULTRAM) 50 MG TABLET    Take 50 mg by mouth 2 (two) times daily as needed for moderate pain.   Modified Medications   No medications on file  Discontinued Medications   No medications on file    Review of Systems  Constitutional: Positive for fatigue.  Cardiovascular: Positive for leg swelling.  Gastrointestinal: Positive for vomiting, abdominal pain and abdominal distention. Negative for constipation and blood in stool.  Musculoskeletal: Positive for neck pain and neck stiffness.  Psychiatric/Behavioral: Negative for suicidal ideas.  All other systems reviewed and are negative.   Filed Vitals:   07/21/15 1538  BP: 146/66  Pulse: 59  Temp: 97.4 F (36.3 C)  TempSrc: Oral  Resp: 20  Height: 5\' 6"  (1.676 m)  Weight: 145 lb 6.4 oz (65.953 kg)  SpO2: 98%   Body mass index is 23.48 kg/(m^2).  Physical Exam  Constitutional: She appears well-developed.  Frail appearing in NAD. Daughter present  HENT:  Mouth/Throat: Oropharynx is clear and moist. No oropharyngeal exudate.  MMM  Eyes: Pupils are equal, round, and reactive to light. No scleral icterus.  Neck: Neck supple. Carotid bruit is not present. No tracheal deviation present. No thyromegaly present.  Cardiovascular: Normal rate, regular rhythm and intact distal pulses.  Exam reveals no gallop and no friction rub.   Murmur (1/6SEM) heard. +1 pitting LE edema b/l. No calf TTP. TED stockings intact  Pulmonary/Chest: Effort normal. No stridor. No respiratory distress. She has wheezes (b/l end expiratory ). She has no rales.  Abdominal:  Soft. Bowel sounds are normal. She exhibits distension. She exhibits no mass. There is no hepatomegaly. There is tenderness (epigastric ). There is no rebound and no guarding.  Musculoskeletal: She exhibits edema.  Lymphadenopathy:    She has no cervical adenopathy.  Neurological: She is alert.  Skin: Skin is warm and dry. Rash (L>R leg venous stasis changes. no ulceration. skin abrasions noted but no secondary signs of infection. no vesicular formation) noted.  Psychiatric: She has a normal mood and affect. Her behavior is normal. Judgment and thought content normal.     Labs reviewed: Office Visit on 07/14/2015  Component Date Value Ref Range Status  . WBC 07/14/2015 6.1  3.4 - 10.8 x10E3/uL Final  . RBC 07/14/2015 3.47* 3.77 - 5.28 x10E6/uL Final  . Hemoglobin 07/14/2015 10.9* 11.1 - 15.9 g/dL Final  . Hematocrit 40/98/1191 32.4* 34.0 - 46.6 % Final  . MCV 07/14/2015 93  79 - 97 fL Final  . MCH 07/14/2015 31.4  26.6 - 33.0 pg Final  . MCHC 07/14/2015 33.6  31.5 - 35.7 g/dL Final  . RDW 47/82/9562 16.5* 12.3 - 15.4 % Final  . Neutrophils 07/14/2015 65   Final  . Lymphs 07/14/2015 21   Final  . Monocytes 07/14/2015 10   Final  . Eos 07/14/2015 3   Final  . Basos 07/14/2015 0   Final  . Neutrophils Absolute 07/14/2015 4.0  1.4 - 7.0 x10E3/uL Final  . Lymphocytes Absolute 07/14/2015 1.3  0.7 - 3.1 x10E3/uL Final  . Monocytes Absolute 07/14/2015 0.6  0.1 - 0.9 x10E3/uL Final  . EOS (ABSOLUTE) 07/14/2015 0.2  0.0 - 0.4 x10E3/uL Final  . Basophils Absolute 07/14/2015 0.0  0.0 - 0.2 x10E3/uL Final  . Immature Granulocytes 07/14/2015 1   Final  . Immature Grans (Abs) 07/14/2015 0.0  0.0 - 0.1 x10E3/uL Final  . Glucose 07/14/2015 132* 65 - 99 mg/dL Final  . BUN 13/11/6576 27  10 - 36 mg/dL Final  . Creatinine, Ser 07/14/2015 1.24* 0.57 - 1.00 mg/dL Final  . GFR calc non Af Amer 07/14/2015 38* >59 mL/min/1.73 Final  . GFR calc Af Amer 07/14/2015 44* >59 mL/min/1.73 Final  .  BUN/Creatinine Ratio 07/14/2015 22  12 - 28 Final  . Sodium 07/14/2015 140  134 - 144 mmol/L Final  . Potassium 07/14/2015 4.5  3.5 - 5.2 mmol/L Final  . Chloride 07/14/2015 101  96 - 106 mmol/L Final  . CO2 07/14/2015 24  18 - 29 mmol/L Final  . Calcium 07/14/2015 9.4  8.7 - 10.3 mg/dL Final  . Total Protein 07/14/2015 5.8* 6.0 - 8.5 g/dL Final  . Albumin 46/96/2952 3.7  3.2 - 4.6 g/dL Final  . Globulin, Total 07/14/2015 2.1  1.5 - 4.5 g/dL Final  . Albumin/Globulin Ratio 07/14/2015 1.8  1.2 - 2.2 Final  . Bilirubin Total 07/14/2015 0.3  0.0 - 1.2 mg/dL Final  . Alkaline Phosphatase 07/14/2015 60  39 - 117 IU/L Final  . AST 07/14/2015 19  0 - 40 IU/L Final  . ALT 07/14/2015 16  0 - 32 IU/L Final  . TSH 07/14/2015 4.260  0.450 - 4.500 uIU/mL Final  Admission on 07/05/2015, Discharged on 07/11/2015  No results displayed because  visit has over 200 results.    Admission on 06/20/2015, Discharged on 06/20/2015  Component Date Value Ref Range Status  . Troponin i, poc 06/20/2015 0.03  0.00 - 0.08 ng/mL Final  . Comment 3 06/20/2015          Final   Comment: Due to the release kinetics of cTnI, a negative result within the first hours of the onset of symptoms does not rule out myocardial infarction with certainty. If myocardial infarction is still suspected, repeat the test at appropriate intervals.   . Lactic Acid, Venous 06/20/2015 2.03* 0.5 - 2.0 mmol/L Final  . Comment 06/20/2015 NOTIFIED PHYSICIAN   Final  . WBC 06/20/2015 4.9  4.0 - 10.5 K/uL Final  . RBC 06/20/2015 3.48* 3.87 - 5.11 MIL/uL Final  . Hemoglobin 06/20/2015 10.5* 12.0 - 15.0 g/dL Final  . HCT 45/40/9811 30.8* 36.0 - 46.0 % Final  . MCV 06/20/2015 88.5  78.0 - 100.0 fL Final  . MCH 06/20/2015 30.2  26.0 - 34.0 pg Final  . MCHC 06/20/2015 34.1  30.0 - 36.0 g/dL Final  . RDW 91/47/8295 14.6  11.5 - 15.5 % Final  . Platelets 06/20/2015 178  150 - 400 K/uL Final  . Neutrophils Relative % 06/20/2015 47   Final  .  Neutro Abs 06/20/2015 2.3  1.7 - 7.7 K/uL Final  . Lymphocytes Relative 06/20/2015 25   Final  . Lymphs Abs 06/20/2015 1.2  0.7 - 4.0 K/uL Final  . Monocytes Relative 06/20/2015 17   Final  . Monocytes Absolute 06/20/2015 0.8  0.1 - 1.0 K/uL Final  . Eosinophils Relative 06/20/2015 10   Final  . Eosinophils Absolute 06/20/2015 0.5  0.0 - 0.7 K/uL Final  . Basophils Relative 06/20/2015 1   Final  . Basophils Absolute 06/20/2015 0.1  0.0 - 0.1 K/uL Final  . Sodium 06/20/2015 136  135 - 145 mmol/L Final  . Potassium 06/20/2015 4.1  3.5 - 5.1 mmol/L Final  . Chloride 06/20/2015 103  101 - 111 mmol/L Final  . CO2 06/20/2015 22  22 - 32 mmol/L Final  . Glucose, Bld 06/20/2015 136* 65 - 99 mg/dL Final  . BUN 62/13/0865 36* 6 - 20 mg/dL Final  . Creatinine, Ser 06/20/2015 1.42* 0.44 - 1.00 mg/dL Final  . Calcium 78/46/9629 9.7  8.9 - 10.3 mg/dL Final  . Total Protein 06/20/2015 7.2  6.5 - 8.1 g/dL Final  . Albumin 52/84/1324 3.5  3.5 - 5.0 g/dL Final  . AST 40/01/2724 47* 15 - 41 U/L Final  . ALT 06/20/2015 43  14 - 54 U/L Final  . Alkaline Phosphatase 06/20/2015 60  38 - 126 U/L Final  . Total Bilirubin 06/20/2015 0.3  0.3 - 1.2 mg/dL Final  . GFR calc non Af Amer 06/20/2015 31* >60 mL/min Final  . GFR calc Af Amer 06/20/2015 36* >60 mL/min Final   Comment: (NOTE) The eGFR has been calculated using the CKD EPI equation. This calculation has not been validated in all clinical situations. eGFR's persistently <60 mL/min signify possible Chronic Kidney Disease.   . Anion gap 06/20/2015 11  5 - 15 Final  . B Natriuretic Peptide 06/20/2015 238.3* 0.0 - 100.0 pg/mL Final  . Influenza A By PCR 06/20/2015 POSITIVE* NEGATIVE Final  . Influenza B By PCR 06/20/2015 NEGATIVE  NEGATIVE Final  . H1N1 flu by pcr 06/20/2015 NOT DETECTED  NOT DETECTED Final   Comment:        The Xpert Flu assay (FDA  approved for nasal aspirates or washes and nasopharyngeal swab specimens), is intended as an aid in the  diagnosis of influenza and should not be used as a sole basis for treatment.   . Lactic Acid, Venous 06/20/2015 1.71  0.5 - 2.0 mmol/L Final  Appointment on 06/16/2015  Component Date Value Ref Range Status  . Glucose 06/16/2015 156* 65 - 99 mg/dL Final  . BUN 30/86/5784 33  10 - 36 mg/dL Final  . Creatinine, Ser 06/16/2015 1.42* 0.57 - 1.00 mg/dL Final  . GFR calc non Af Amer 06/16/2015 33* >59 mL/min/1.73 Final  . GFR calc Af Amer 06/16/2015 38* >59 mL/min/1.73 Final  . BUN/Creatinine Ratio 06/16/2015 23  11 - 26 Final  . Sodium 06/16/2015 130* 134 - 144 mmol/L Final  . Potassium 06/16/2015 4.1  3.5 - 5.2 mmol/L Final  . Chloride 06/16/2015 92* 96 - 106 mmol/L Final  . CO2 06/16/2015 20  18 - 29 mmol/L Final  . Calcium 06/16/2015 9.4  8.7 - 10.3 mg/dL Final  . TSH 69/62/9528 0.009* 0.450 - 4.500 uIU/mL Final  Appointment on 05/26/2015  Component Date Value Ref Range Status  . Glucose 05/26/2015 90  65 - 99 mg/dL Final  . BUN 41/32/4401 31* 8 - 27 mg/dL Final  . Creatinine, Ser 05/26/2015 1.30* 0.57 - 1.00 mg/dL Final  . GFR calc non Af Amer 05/26/2015 36* >59 mL/min/1.73 Final  . GFR calc Af Amer 05/26/2015 42* >59 mL/min/1.73 Final  . BUN/Creatinine Ratio 05/26/2015 24  11 - 26 Final  . Sodium 05/26/2015 139  134 - 144 mmol/L Final  . Potassium 05/26/2015 4.7  3.5 - 5.2 mmol/L Final  . Chloride 05/26/2015 98  96 - 106 mmol/L Final  . CO2 05/26/2015 24  18 - 29 mmol/L Final  . Calcium 05/26/2015 9.8  8.7 - 10.3 mg/dL Final  Abstract on 02/72/5366  Component Date Value Ref Range Status  . HM Dexa Scan 04/28/2015 [facility]: Osteopenia   Final   [facility] [phone]    Ct Abdomen Pelvis Wo Contrast  07/05/2015  CLINICAL DATA:  Bloating, difficulty swallowing, history small-bowel obstruction, ventral hernia, GERD, stomach ulcers, hypertension, type II diabetes mellitus, COPD, asthma EXAM: CT ABDOMEN AND PELVIS WITHOUT CONTRAST TECHNIQUE: Multidetector CT imaging of the abdomen and  pelvis was performed following the standard protocol without IV contrast. Sagittal and coronal MPR images reconstructed from axial data set. Patient drank dilute oral contrast for exam. COMPARISON:  01/10/2015 FINDINGS: Lung bases clear. Multiple low-attenuation hepatic foci likely cysts, unchanged. Gallbladder surgically absent. Remainder of liver, spleen, pancreas, kidneys, and adrenal glands normal. Extensive atherosclerotic calcifications. Questionable mild wall thickening of distal esophagus. Increased thickening of the gastric wall versus the previous exam, nonspecific. Small ventral hernia containing nonobstructed bowel loop. Scattered colonic diverticulosis without evidence of diverticulitis or bowel obstruction. Bladder, ureters, uterus and adnexa normal appearance. Appendix not visualized. No mass, adenopathy, free air or free fluid. Bones demineralized. IMPRESSION: No definite acute intra abdominal abnormalities. Questionable mild wall thickening of the gastric wall versus artifact from underdistention ; consider followup endoscopy or upper GI assessment if patient has persistent symptoms. Electronically Signed   By: Ulyses Southward M.D.   On: 07/05/2015 19:53   Dg Chest 1 View  07/05/2015  CLINICAL DATA:  Dysphagia EXAM: CHEST 1 VIEW COMPARISON:  06/20/2015 FINDINGS: There is unchanged mild cardiomegaly. The lungs are clear. No large effusions. Normal pulmonary vasculature. Unremarkable hilar and mediastinal contours, unchanged. IMPRESSION: Cardiomegaly, unchanged.  No acute cardiopulmonary findings.  Electronically Signed   By: Ellery Plunk M.D.   On: 07/05/2015 22:29   Ct Chest Wo Contrast  07/07/2015  CLINICAL DATA:  Shortness of breath the last few days. Unable to have contrast due to renal function Hx: HTN, Stroke, COPD, Diabetes EXAM: CT CHEST WITHOUT CONTRAST TECHNIQUE: Multidetector CT imaging of the chest was performed following the standard protocol without IV contrast. COMPARISON:  CT  abdomen 01/10/2015 and earlier studies FINDINGS: Mediastinum/Lymph Nodes: Coarse coronary and aortic calcifications. No hilar or mediastinal adenopathy, sensitivity decreased without IV contrast. No pericardial effusion. Lungs/Pleura: Lungs clear.  No pleural effusion.  No pneumothorax. Upper abdomen: Cholecystectomy clips. Stable low-attenuation liver lesions since previous CT abdomen 05/21/2014 probably cysts. Musculoskeletal: T11 compression fracture deformity with at least 50% loss of height anteriorly, stable 05/21/2014. No retropulsion. No posterior element involvement. No new fracture. Sternum intact. IMPRESSION: 1. No acute abnormality. 2. Atherosclerosis, including aortic and coronary artery disease. Please note that although the presence of coronary artery calcium documents the presence of coronary artery disease, the severity of this disease and any potential stenosis cannot be assessed on this non-gated CT examination. Assessment for potential risk factor modification, dietary therapy or pharmacologic therapy may be warranted, if clinically indicated. 3. Old L2-T11 compression deformity. Electronically Signed   By: Corlis Leak M.D.   On: 07/07/2015 13:07   Nm Pulmonary Perf And Vent  07/07/2015  CLINICAL DATA:  Short of breath EXAM: NUCLEAR MEDICINE VENTILATION - PERFUSION LUNG SCAN TECHNIQUE: Ventilation images were obtained in multiple projections using inhaled aerosol Tc-83m DTPA. Perfusion images were obtained in multiple projections after intravenous injection of Tc-63m MAA. RADIOPHARMACEUTICALS:  29.5 mCi Technetium-82m DTPA aerosol inhalation and 4.2 mCi Technetium-54m MAA IV COMPARISON:  Chest x-ray 07/06/2015 FINDINGS: Ventilation: No focal ventilation defect. Perfusion: No wedge shaped peripheral perfusion defects to suggest acute pulmonary embolism. IMPRESSION: Normal ventilation perfusion scan. Very low probability for pulmonary embolism. Electronically Signed   By: Marlan Palau M.D.   On:  07/07/2015 12:57   Dg Chest Port 1 View  07/08/2015  CLINICAL DATA:  Shortness of breath EXAM: PORTABLE CHEST 1 VIEW COMPARISON:  07/07/2015 FINDINGS: The heart size appears normal. There is aortic atherosclerosis. No pleural effusion or edema. No airspace consolidation identified. Osteoarthritis is noted involving both glenohumeral joints. IMPRESSION: 1. No acute cardiopulmonary abnormalities. Electronically Signed   By: Signa Kell M.D.   On: 07/08/2015 18:02   Dg Chest Port 1 View  07/06/2015  CLINICAL DATA:  Dyspnea EXAM: PORTABLE CHEST 1 VIEW COMPARISON:  07/05/2015 FINDINGS: Cardiomediastinal silhouette is stable. No segmental infiltrate or pleural effusion. Central mild vascular congestion without convincing pulmonary edema. Elevation of the right hemidiaphragm again noted. IMPRESSION: No segmental infiltrate or pleural effusion. Central mild vascular congestion without convincing pulmonary edema. Elevation of the right hemidiaphragm again noted. Electronically Signed   By: Natasha Mead M.D.   On: 07/06/2015 09:58     Assessment/Plan   ICD-9-CM ICD-10-CM   1. Bloating 787.3 R14.0 DG Abd 2 Views  2. Pyloric stenosis, acquired 537.0 K31.1 DG Abd 2 Views  3. Chronic atrial fibrillation (HCC) 427.31 I48.2   4. Esophageal candidiasis (HCC) 112.84 B37.81   5. Hypothyroidism, unspecified hypothyroidism type 244.9 E03.9 levothyroxine (SYNTHROID, LEVOTHROID) 25 MCG tablet     TSH     T4, Free  6. Essential hypertension, benign 401.1 I10   7. Edema of both legs 782.3 R60.0   8. Weight gain 783.1 R63.5     If LE swelling  no better by next week, she may need diuretic  Resume thyroid medication at lower dose  STOP RISPERDAL  Continue other medications as ordered  Follow up with GI and cardiology as scheduled  Lab appt for 4 weeks to recheck thyroid level  Continue wearing TED stockings for swelling  Will call with imaging results  Follow up as scheduled  Aodhan Scheidt S. Ancil Linsey  [facility] [address] [place], [place] [address] [phone] Cell (Monday-Friday 8 AM - 5 PM) [phone] After 5 PM and follow prompts

## 2015-07-21 NOTE — Telephone Encounter (Signed)
Thyroid testing was in the normal range. Should be with needed in 6 weeks. Do not resume thyroid medication at this time.

## 2015-07-21 NOTE — Telephone Encounter (Signed)
Daughter called regarding a referral for an endocrinologist( Dr. Mena Goes). Please Advise!

## 2015-07-21 NOTE — Telephone Encounter (Signed)
Called the patients home to read the results notes and instructions from Dr. Nyoka Cowden, got no answer left message that seeing as she has an appointment today her lab results would be put  in her chart

## 2015-07-21 NOTE — Patient Instructions (Signed)
Resume thyroid medication at lower dose  STOP RISPERDAL  Continue other medications as ordered  Follow up with GI and cardiology as scheduled  Lab appt for 4 weeks to recheck thyroid level  Continue wearing TED stockings for swelling  Will call with imaging results  Follow up as scheduled

## 2015-07-21 NOTE — Progress Notes (Signed)
TSH ordered for 6 weeks, sent to PCP per Dr. Nyoka Cowden.

## 2015-07-21 NOTE — Telephone Encounter (Signed)
Will address at appt today

## 2015-07-21 NOTE — Telephone Encounter (Signed)
noted 

## 2015-07-22 ENCOUNTER — Ambulatory Visit (INDEPENDENT_AMBULATORY_CARE_PROVIDER_SITE_OTHER): Payer: Medicare Other | Admitting: Internal Medicine

## 2015-07-22 ENCOUNTER — Encounter: Payer: Self-pay | Admitting: Internal Medicine

## 2015-07-22 VITALS — BP 144/60 | HR 60 | Ht 64.5 in | Wt 164.0 lb

## 2015-07-22 DIAGNOSIS — B3781 Candidal esophagitis: Secondary | ICD-10-CM

## 2015-07-22 DIAGNOSIS — K2100 Gastro-esophageal reflux disease with esophagitis, without bleeding: Secondary | ICD-10-CM

## 2015-07-22 DIAGNOSIS — R1310 Dysphagia, unspecified: Secondary | ICD-10-CM

## 2015-07-22 DIAGNOSIS — K3184 Gastroparesis: Secondary | ICD-10-CM | POA: Diagnosis not present

## 2015-07-22 DIAGNOSIS — R143 Flatulence: Secondary | ICD-10-CM

## 2015-07-22 NOTE — Progress Notes (Signed)
HISTORY OF PRESENT ILLNESS:  Shannon [name] is a 82 y.o. female with multiple significant medical problems who presents today for post hospital follow-up. She is accompanied by her daughter. She has a history of GERD with esophagitis, recurrent small bowel obstruction with multiple surgeries, and gastroparesis with gastric bezoar formation requiring endoscopic therapy (see office note 06/10/2014 for some details) . Most recently hospitalized April 3 through 07/11/2015 with all respiratory failure, atrial fibrillation, non-ST elevation myocardial infarction, and dysphagia. Upper endoscopy was performed 07/08/2015. Patient was found to have severe Candida esophagitis as well as subtle pyloric stenosis. Pyloric stenosis not appreciated on previous endoscopies. Treated with Diflucan. Since discharge she reports marked improvement in swallowing. She continues to complain of postprandial bloating and fullness with increased gas as manifested by belching and flatus. No abdominal pain. She has been compliant with. Diet. Vomiting on one occasion. Still completing Diflucan. No other complaints.  REVIEW OF SYSTEMS:  All non-GI ROS negative except for sinus and allergy, anxiety, arthritis, visual change, depression, fatigue, sleeping problems, ankle swelling, increased urination  Past Medical History  Diagnosis Date  . Pneumonia   . Hyperlipidemia   . Hypertension   . Small bowel obstruction ([place])     "I don't know how many" (01/11/2015)  . Ventral hernia with bowel obstruction   . Asthma   . Hyperlipidemia   . Stroke ([place])     "light one"  . COPD (chronic obstructive pulmonary disease) ([place])   . Hypothyroidism   . Type II diabetes mellitus ([place])   . Anemia     previous blood transfusions  . GERD (gastroesophageal reflux disease)   . History of stomach ulcers   . Arthritis     "all over"  . Renal insufficiency   . Depression     "light case"  . SIADH (syndrome of inappropriate ADH production) ([place])      Shannon [name] 01/10/2015  . Perforated gastric ulcer ([place])   . Gastric stenosis   . Paraesophageal hernia     Past Surgical History  Procedure Laterality Date  . Tubal ligation    . Colectomy    . Hernia repair  2015  . Esophagogastroduodenoscopy N/A 01/19/2014    Procedure: ESOPHAGOGASTRODUODENOSCOPY (EGD);  Surgeon: Irene Shipper, MD;  Location: [facility];  Service: Endoscopy;  Laterality: N/A;  . Esophagogastroduodenoscopy N/A 01/20/2014    Procedure: ESOPHAGOGASTRODUODENOSCOPY (EGD);  Surgeon: Irene Shipper, MD;  Location: [facility];  Service: Endoscopy;  Laterality: N/A;  . Esophagogastroduodenoscopy N/A 03/19/2014    Procedure: ESOPHAGOGASTRODUODENOSCOPY (EGD);  Surgeon: Milus Banister, MD;  Location: [facility];  Service: Endoscopy;  Laterality: N/A;  . Cholecystectomy open    . Ventral hernia repair  2015    incarcerated ventral hernia ([facility] 09/2013)/notes 01/10/2015  . Cataract extraction w/ intraocular lens  implant, bilateral    . Gastrojejunostomy      hx/notes 01/10/2015  . Laparotomy N/A 01/20/2015    Procedure: EXPLORATORY LAPAROTOMY;  Surgeon: Coralie Keens, MD;  Location: [facility];  Service: General;  Laterality: N/A;  . Lysis of adhesion N/A 01/20/2015    Procedure: LYSIS OF ADHESIONS < 1 HOUR;  Surgeon: Coralie Keens, MD;  Location: [facility];  Service: General;  Laterality: N/A;  . Esophagogastroduodenoscopy N/A 07/08/2015    Procedure: ESOPHAGOGASTRODUODENOSCOPY (EGD);  Surgeon: Doran Stabler, MD;  Location: [facility]COPY;  Service: Endoscopy;  Laterality: N/A;    Social History Shannon [name]  reports that she has never smoked. She has quit using smokeless  tobacco. Her smokeless tobacco use included Snuff. She reports that she does not drink alcohol or use illicit drugs.  family history includes Diabetes in her brother; Hypertension in her mother; Stroke in her mother. There is no history of Heart attack.  No Known Allergies     PHYSICAL EXAMINATION:  Vital  signs: BP 144/60 mmHg  Pulse 60  Ht 5' 4.5" (1.638 m)  Wt 164 lb (74.39 kg)  BMI 27.73 kg/m2 General: Well-developed, well-nourished, no acute distress HEENT: Sclerae are anicteric, conjunctiva pink. Oral mucosa intact Lungs: Clear Heart: Regular Abdomen: soft, nontender, nondistended, no obvious ascites, no peritoneal signs, normal bowel sounds. No organomegaly.No succussion splash Extremities: No Clubbing or cyanosis. 1+ edema bilaterally Psychiatric: alert and oriented x3. Cooperative  ASSESSMENT:  #1. Recent problems with dysphagia secondary to severe Candida esophagitis. Improved with Diflucan #2. Impaired gastric emptying. Chronic. Stable #3. GERD with history of erosive esophagitis #4. Multiple abdominal surgeries #5. Advanced age and multiple medical problems   PLAN:  #1. Complete Diflucan #2. Continue with pured diet #3. Discussion today on intestinal gas. #4. GI follow-up as needed 15 minutes was spent face-to-face with the patient. Almost the entire time was use for counseling regarding her GI conditions. Also answering patient and patient's daughter questions

## 2015-07-22 NOTE — Patient Instructions (Signed)
Please follow up with Dr. Perry as needed 

## 2015-07-23 ENCOUNTER — Ambulatory Visit (INDEPENDENT_AMBULATORY_CARE_PROVIDER_SITE_OTHER): Payer: Medicare Other | Admitting: Physician Assistant

## 2015-07-23 ENCOUNTER — Encounter: Payer: Self-pay | Admitting: Physician Assistant

## 2015-07-23 VITALS — BP 120/60 | HR 53 | Ht 64.0 in | Wt 148.8 lb

## 2015-07-23 DIAGNOSIS — I4891 Unspecified atrial fibrillation: Secondary | ICD-10-CM

## 2015-07-23 DIAGNOSIS — I5033 Acute on chronic diastolic (congestive) heart failure: Secondary | ICD-10-CM

## 2015-07-23 DIAGNOSIS — Z79899 Other long term (current) drug therapy: Secondary | ICD-10-CM | POA: Diagnosis not present

## 2015-07-23 DIAGNOSIS — I5032 Chronic diastolic (congestive) heart failure: Secondary | ICD-10-CM | POA: Diagnosis not present

## 2015-07-23 DIAGNOSIS — I509 Heart failure, unspecified: Secondary | ICD-10-CM | POA: Diagnosis not present

## 2015-07-23 LAB — BASIC METABOLIC PANEL
BUN: 16 mg/dL (ref 7–25)
CO2: 22 mmol/L (ref 20–31)
Calcium: 9.1 mg/dL (ref 8.6–10.4)
Chloride: 105 mmol/L (ref 98–110)
Creat: 1.37 mg/dL — ABNORMAL HIGH (ref 0.60–0.88)
Glucose, Bld: 123 mg/dL — ABNORMAL HIGH (ref 65–99)
Potassium: 4.8 mmol/L (ref 3.5–5.3)
Sodium: 135 mmol/L (ref 135–146)

## 2015-07-23 LAB — CBC
HCT: 31.1 % — ABNORMAL LOW (ref 35.0–45.0)
Hemoglobin: 10.2 g/dL — ABNORMAL LOW (ref 11.7–15.5)
MCH: 31.4 pg (ref 27.0–33.0)
MCHC: 32.8 g/dL (ref 32.0–36.0)
MCV: 95.7 fL (ref 80.0–100.0)
MPV: 10.7 fL (ref 7.5–12.5)
Platelets: 285 10*3/uL (ref 140–400)
RBC: 3.25 MIL/uL — ABNORMAL LOW (ref 3.80–5.10)
RDW: 16.9 % — ABNORMAL HIGH (ref 11.0–15.0)
WBC: 5.2 10*3/uL (ref 3.8–10.8)

## 2015-07-23 MED ORDER — FUROSEMIDE 20 MG PO TABS: 20.0000 mg | ORAL_TABLET | Freq: Every day | ORAL | Status: DC

## 2015-07-23 NOTE — Progress Notes (Signed)
Cardiology Office Note   Date:  07/23/2015   ID:  Shannon [name], DOB Jun 09, 1923, MRN [id number]  PCP:  Shannon Boys, DO  Cardiologist:  Dr. Delton [name]    Chief Complaint  Patient presents with  . Hospitalization Follow-up    seen for Dr. Delton [name]      History of Present Illness: Shannon [name] is a 82 y.o. female who presents for post hospital follow-up. She previously had a cardiac catheterization in August 2012 at [facility] which showed 40% ostial LAD stenosis, normal EF, otherwise no significant CAD. She was admitted from 4/3 -4/8 originally for dysphagia. While down in radiology waiting after an aborted barium swallow, patient had sudden onset of shortness of breath requiring breathing treatment. She subsequently when into rapid atrial flutter with onset of chest pain. Cardiology was consulted. Subsequent troponin I and lactic acid were both elevated. She was placed on amiodarone and spontaneously converted to normal sinus rhythm. She also noted to have elevated creatinine of 1.6 c/w acute on chronic renal insufficiency. TSH was low and a free T4 was elevated at 2.0. VQ scan was negative for PE. Echocardiogram obtained on 07/08/2015 showed EF 60-65%, grade 1 diastolic dysfunction, mild AR, mild MR, moderately to severely dilated left atrium. EGD was eventually performed on 07/08/2015 which showed candidiasis throughout the entire esophagus, moderate pyloric stenosis. She was also placed on eliquis 2.5 mg twice a day. It was felt that her mildly elevated troponin was in the setting of atrial flutter with RVR and acute on chronic renal failure.   She was eventually discharged on 07/11/2015. She presents today for outpatient cardiology follow-up. She does have chronic joint aches. She denies any bleeding issues. She denies any chest discomfort. Since discharge, she has noticed some lower extremity edema and weight gain. She has 3+ pitting edema on physical exam. She denies any chest  discomfort or shortness breath. Her lung is clear. She has been compliant with her medication.    Past Medical History  Diagnosis Date  . Pneumonia   . Hypertension   . Small bowel obstruction ([facility])     "I don't know how many" (01/11/2015)  . Ventral hernia with bowel obstruction   . Asthma   . Hyperlipidemia   . Stroke ([facility])     "light one"  . COPD (chronic obstructive pulmonary disease) ([facility])   . Hypothyroidism   . Type II diabetes mellitus ([facility])   . Anemia     previous blood transfusions  . GERD (gastroesophageal reflux disease)   . History of stomach ulcers   . Arthritis     "all over"  . Renal insufficiency   . Depression     "light case"  . SIADH (syndrome of inappropriate ADH production) ([facility])     Shannon [name] 01/10/2015  . Perforated gastric ulcer ([facility])   . Gastric stenosis   . Paraesophageal hernia     Past Surgical History  Procedure Laterality Date  . Tubal ligation    . Colectomy    . Hernia repair  2015  . Esophagogastroduodenoscopy N/A 01/19/2014    Procedure: ESOPHAGOGASTRODUODENOSCOPY (EGD);  Surgeon: Hilarie Fredrickson, MD;  Location: [facility];  Service: Endoscopy;  Laterality: N/A;  . Esophagogastroduodenoscopy N/A 01/20/2014    Procedure: ESOPHAGOGASTRODUODENOSCOPY (EGD);  Surgeon: Hilarie Fredrickson, MD;  Location: [facility];  Service: Endoscopy;  Laterality: N/A;  . Esophagogastroduodenoscopy N/A 03/19/2014    Procedure: ESOPHAGOGASTRODUODENOSCOPY (EGD);  Surgeon: Rachael Fee, MD;  Location: [facility] ENDOSCOPY;  Service: Endoscopy;  Laterality: N/A;  . Cholecystectomy open    . Ventral hernia repair  2015    incarcerated ventral hernia ([facility] 09/2013)/notes 01/10/2015  . Cataract extraction w/ intraocular lens  implant, bilateral    . Gastrojejunostomy      hx/notes 01/10/2015  . Laparotomy N/A 01/20/2015    Procedure: EXPLORATORY LAPAROTOMY;  Surgeon: Abigail Miyamoto, MD;  Location: [facility] OR;  Service: General;  Laterality: N/A;  . Lysis of adhesion N/A  01/20/2015    Procedure: LYSIS OF ADHESIONS < 1 HOUR;  Surgeon: Abigail Miyamoto, MD;  Location: [facility] OR;  Service: General;  Laterality: N/A;  . Esophagogastroduodenoscopy N/A 07/08/2015    Procedure: ESOPHAGOGASTRODUODENOSCOPY (EGD);  Surgeon: Sherrilyn Rist, MD;  Location: [facility] ENDOSCOPY;  Service: Endoscopy;  Laterality: N/A;     Current Outpatient Prescriptions  Medication Sig Dispense Refill  . acetaminophen (TYLENOL) 325 MG tablet Take 325 mg by mouth every 6 (six) hours as needed for mild pain or fever.     [name] ADVAIR DISKUS 250-50 MCG/DOSE AEPB INHALE 1 PUFF INTO THE LUNGS 2 (TWO) TIMES DAILY AS NEEDED FOR SHORTNESS OF BREATH  3  . albuterol (PROVENTIL HFA;VENTOLIN HFA) 108 (90 Base) MCG/ACT inhaler Inhale 2 puffs into the lungs every 6 (six) hours as needed for wheezing or shortness of breath. 1 Inhaler 2  . amiodarone (PACERONE) 200 MG tablet Take 1 tablet (200 mg total) by mouth daily. 30 tablet 0  . amLODipine (NORVASC) 10 MG tablet TAKE 1 TABLET BY MOUTH DAILY FOR HYPERTENSION 30 tablet 3  . apixaban (ELIQUIS) 2.5 MG TABS tablet Take one twice daily for anticoagulation to prevent stroke. 60 tablet 11  . atorvastatin (LIPITOR) 10 MG tablet Take 10 mg by mouth daily.    [name] b complex vitamins tablet Take 1 tablet by mouth daily.     . calcium-vitamin D (OSCAL WITH D) 500-200 MG-UNIT per tablet Take 1 tablet by mouth daily with breakfast.     . chlorhexidine (PERIDEX) 0.12 % solution Use as directed 15 mLs in the mouth or throat 2 (two) times daily.    . [employer] GAS RELIEF 80 MG chewable tablet CHEW 1 TABLET BY MOUTH 4 TIMES A DAY AS NEEDED FOR FLATULENCE 30 tablet 0  . diclofenac sodium (VOLTAREN) 1 % GEL Apply 2 g topically daily as needed (for pain).     . feeding supplement (BOOST / RESOURCE BREEZE) LIQD Take 1 Container by mouth 3 (three) times daily between meals. 90 Container 0  . fluticasone (FLONASE) 50 MCG/ACT nasal spray Place 2 sprays into both nostrils daily. 16 g 3  . gabapentin  (NEURONTIN) 300 MG capsule TAKE 1 CAPSULE (300 MG TOTAL) BY MOUTH 3 (THREE) TIMES DAILY. 90 capsule 3  . hydrALAZINE (APRESOLINE) 100 MG tablet TAKE 1 TABLET (100 MG TOTAL) BY MOUTH 3 (THREE) TIMES DAILY. 90 tablet 3  . isosorbide dinitrate (ISORDIL) 10 MG tablet Take 10 mg by mouth 3 (three) times daily. If chest pains occur increase to two tablets daily    . lactulose (CHRONULAC) 10 GM/15ML solution Take 15 mLs (10 g total) by mouth daily as needed for mild constipation. 240 mL 0  . latanoprost (XALATAN) 0.005 % ophthalmic solution Place 1 drop into both eyes at bedtime. 2.5 mL 12  . levothyroxine (SYNTHROID, LEVOTHROID) 25 MCG tablet Take 1 tablet (25 mcg total) by mouth daily before breakfast. 30 tablet 6  . metoprolol succinate (TOPROL-XL) 100 MG 24 hr tablet Take 1 tablet (  100 mg total) by mouth daily. 30 tablet 0  . Multiple Vitamins-Minerals (MULTIVITAMIN WITH MINERALS) tablet Take 1 tablet by mouth every morning.     . pantoprazole (PROTONIX) 40 MG tablet TAKE 1 TABLET BY MOUTH TWICE A DAY 60 tablet 11  . Polyethyl Glycol-Propyl Glycol 0.4-0.3 % SOLN Place 1 drop into both eyes 4 (four) times daily.     . polyethylene glycol powder (GLYCOLAX/MIRALAX) powder Take 17 g by mouth 2 (two) times daily as needed. (Patient taking differently: Take 17 g by mouth 2 (two) times daily. ) 3350 g 1  . RESTASIS 0.05 % ophthalmic emulsion USE 1 DROP INTO BOTH EYES TWICE DAILY 60 mL 1  . sennosides-docusate sodium (SENOKOT-S) 8.6-50 MG tablet Take 1 tablet by mouth 2 (two) times daily.     . sertraline (ZOLOFT) 100 MG tablet TAKE 1 TABLET BY MOUTH ONCE DAILY FOR DEPRESSION 30 tablet 5  . sodium chloride 1 g tablet TAKE 1 TABLET BY MOUTH THREE TIMES DAILY 90 tablet 2  . traMADol (ULTRAM) 50 MG tablet Take 50 mg by mouth 2 (two) times daily as needed for moderate pain.     . furosemide (LASIX) 20 MG tablet Take 1 tablet (20 mg total) by mouth daily. 30 tablet 5   No current facility-administered medications  for this visit.    Allergies:   Review of patient's allergies indicates no known allergies.    Social History:  The patient  reports that she has never smoked. She has quit using smokeless tobacco. Her smokeless tobacco use included Snuff. She reports that she does not drink alcohol or use illicit drugs.   Family History:  The patient's family history includes Diabetes in her brother; Hypertension in her mother; Stroke in her mother. There is no history of Heart attack.    ROS:  Please [name] the history of present illness.   Otherwise, review of systems are positive for LE edema, body ache.   All other systems are reviewed and negative.    PHYSICAL EXAM: VS:  BP 120/60 mmHg  Pulse 53  Ht 5\' 4"  (1.626 m)  Wt 148 lb 12.8 oz (67.495 kg)  BMI 25.53 kg/m2  SpO2 97% , BMI Body mass index is 25.53 kg/(m^2). GEN: Well nourished, well developed, in no acute distress HEENT: normal Neck: no JVD, carotid bruits, or masses Cardiac: RRR; no murmurs, rubs, or gallops,no edema  Respiratory:  clear to auscultation bilaterally, normal work of breathing GI: soft, nontender, nondistended, + BS MS: no deformity or atrophy Skin: warm and dry, no rash Neuro:  Strength and sensation are intact Psych: euthymic mood, full affect   EKG:  EKG is ordered today. The ekg ordered today demonstrates NSR with poor R wave progression   Recent Labs: 01/22/2015: Magnesium 1.9 06/20/2015: B Natriuretic Peptide 238.3* 07/10/2015: Hemoglobin 11.7*; Platelets 147* 07/14/2015: ALT 16; BUN 27; Creatinine, Ser 1.24*; Potassium 4.5; Sodium 140; TSH 4.260    Lipid Panel    Component Value Date/Time   CHOL 126 04/07/2015 1015   CHOL 110 12/24/2013 0456   TRIG 107 04/07/2015 1015   HDL 69 04/07/2015 1015   HDL 58 12/24/2013 0456   CHOLHDL 1.8 04/07/2015 1015   CHOLHDL 1.9 12/24/2013 0456   VLDL 26 12/24/2013 0456   LDLCALC 36 04/07/2015 1015   LDLCALC 26 12/24/2013 0456      Wt Readings from Last 3 Encounters:   07/23/15 148 lb 12.8 oz (67.495 kg)  07/22/15 164 lb (74.39 kg)  07/21/15 145 lb 6.4 oz (65.953 kg)      Other studies Reviewed: Additional studies/ records that were reviewed today include:   Echo 07/08/2015 LV EF: 60% - 65%  ------------------------------------------------------------------- Indications: CHF - 428.0.  ------------------------------------------------------------------- History: PMH: Atrial flutter. Chronic obstructive pulmonary disease. PMH: Stroke. Risk factors: Hypertension. Diabetes mellitus. Dyslipidemia.  ------------------------------------------------------------------- Study Conclusions  - Left ventricle: The cavity size was normal. Wall thickness was  increased in a pattern of mild LVH. Systolic function was normal.  The estimated ejection fraction was in the range of 60% to 65%.  Doppler parameters are consistent with abnormal left ventricular  relaxation (grade 1 diastolic dysfunction). - Aortic valve: There was mild regurgitation. - Mitral valve: There was mild regurgitation. - Left atrium: The atrium was moderately to severely dilated.   Review of the above records demonstrates:   Recently admitted for esophagitis secondary to candidiasis, had new onset of rapid atrial flutter converted on amiodarone   ASSESSMENT AND PLAN:  1.  Acute on chronic diastolic heart failure  - Echo 07/08/2015 EF 60-65%, grade 1 diastolic dysfunction, mild AR, mild MR, moderately to severely dilated left atrium.  - she has 3+ pitting edema on exam. Lung clear. She appears to be in acute HF, will start patient on 20mg  daily lasix. She is on Na tablet for hyponatremia. I will check BMET today  2. Paroxysmal atrial flutter on amiodarone and eliquis  - CHA2DS2-Vasc score 9 (female, HTN, age, stroke, DM II, HF, CAD)  - EKG shows NSR, poor R wave progression. HR high 50s. Will continue on current medication.  3. Dysphagia 2/2 esophageal  candidiasis: resolved on diflucan  4. Hyperlipidemia: on lipitor 10mg   5. HTN: well controlled, if BP low on lasix, will cut back on amlodipine  6. Hypothyroidism with low TSH and high free T4: synthroid cut back recently  7. Nonobstructive CAD:   - cardiac catheterization in August 2012 at [facility] which showed 40% ostial LAD stenosis, normal EF, otherwise no significant CAD  - she did have elevated trop on recent hospital in the setting of SOB and rapid aflutter. No further work up pursue as her echo shows normal EF. She is no longer having chest pain, I will hold off on ordering further workup    Current medicines are reviewed at length with the patient today.  The patient does not have concerns regarding medicines.  The following changes have been made:  no change  Labs/ tests ordered today include:   Orders Placed This Encounter  Procedures  . Basic Metabolic Panel (BMET)  . CBC  . EKG 12-Lead     Disposition:   FU with Dr. Lindaann Slough APP in 2 weeks  Signed, Azalee Course, [place]  07/23/2015 2:43 PM    [facility] [address] [place], [place], [place]  [address] Phone: [phone]; Fax: [phone]

## 2015-07-23 NOTE — Patient Instructions (Addendum)
Medication Instructions:   START TAKING LASIX  20 MG ONCE A DAY   If you need a refill on your cardiac medications before your next appointment, please call your pharmacy.  Labwork:  BMET AND CBC   Testing/Procedures: NONE ORDER TODAY    Follow-Up:  2 WEEKS WITH AN AVAILABLE  APP    Any Other Special Instructions Will Be Listed Below (If Applicable).

## 2015-07-23 NOTE — Addendum Note (Signed)
Addended by: Freada Bergeron on: 07/23/2015 03:00 PM   Modules accepted: Orders, Medications

## 2015-07-27 ENCOUNTER — Telehealth: Payer: Self-pay | Admitting: *Deleted

## 2015-07-27 NOTE — Telephone Encounter (Signed)
Continue to monitor weight and vital signs. Continue lasix as ordered. Keep legs elevated when seated

## 2015-07-27 NOTE — Telephone Encounter (Signed)
[name], Caregiver called and stated that patient still has water retention, but NO weight change. Patient's cardiologist placed patient on Lasix but there has been no change. Please Advise.

## 2015-07-27 NOTE — Telephone Encounter (Signed)
[name] notified and agreed.  °

## 2015-07-28 ENCOUNTER — Telehealth: Payer: Self-pay | Admitting: Cardiology

## 2015-07-28 NOTE — Telephone Encounter (Signed)
New message   Pt daughter verbalized  that a Anderson Malta left her a vm about her mothers fluid retention and to call office back  142 is pt curent weight   Pt c/o swelling: STAT is pt has developed SOB within 24 hours  1. How long have you been experiencing swelling? 07/23/15 2. Where is the swelling located? legs 3.  Are you currently taking a "fluid pill"? yes  4.  Are you currently SOB? no 5.  Have you traveled recently? no

## 2015-07-28 NOTE — Telephone Encounter (Signed)
-----   Message from [place], [place] sent at 07/27/2015  8:37 AM EDT ----- Kidney function went down slightly (cr went up), difficult to tell if too little fluid or too much, would continue current regimen and followup as previously arranged. Electrolyte stable. CBC shows she is still slightly anemic but this has not changed much compare to previous lab, monitor for sign of bleeding.

## 2015-07-28 NOTE — Telephone Encounter (Signed)
Pt daughter, [name], [place] on file, has been made aware of pts lab results. Veronica verbalized understanding.  She did state that mom did still have the dark spots on her legs. She stated that she had this when they were in the office, that this was nothing new.

## 2015-08-02 ENCOUNTER — Other Ambulatory Visit: Payer: Self-pay | Admitting: Physician Assistant

## 2015-08-02 ENCOUNTER — Encounter: Payer: Self-pay | Admitting: Physician Assistant

## 2015-08-02 ENCOUNTER — Inpatient Hospital Stay (HOSPITAL_COMMUNITY)
Admission: EM | Admit: 2015-08-02 | Discharge: 2015-08-05 | DRG: 308 | Disposition: A | Discharge status: Home / Self Care | Payer: Medicare Other | Attending: Internal Medicine | Admitting: Internal Medicine

## 2015-08-02 ENCOUNTER — Encounter (HOSPITAL_COMMUNITY): Payer: Self-pay

## 2015-08-02 ENCOUNTER — Inpatient Hospital Stay (HOSPITAL_COMMUNITY): Payer: Medicare Other

## 2015-08-02 ENCOUNTER — Ambulatory Visit (INDEPENDENT_AMBULATORY_CARE_PROVIDER_SITE_OTHER): Payer: Medicare Other | Admitting: Physician Assistant

## 2015-08-02 ENCOUNTER — Telehealth: Payer: Self-pay | Admitting: Cardiology

## 2015-08-02 VITALS — BP 116/77 | HR 37 | Ht 65.0 in | Wt 153.8 lb

## 2015-08-02 DIAGNOSIS — E039 Hypothyroidism, unspecified: Secondary | ICD-10-CM | POA: Diagnosis present

## 2015-08-02 DIAGNOSIS — E1122 Type 2 diabetes mellitus with diabetic chronic kidney disease: Secondary | ICD-10-CM | POA: Diagnosis present

## 2015-08-02 DIAGNOSIS — I10 Essential (primary) hypertension: Secondary | ICD-10-CM

## 2015-08-02 DIAGNOSIS — R0789 Other chest pain: Secondary | ICD-10-CM | POA: Diagnosis not present

## 2015-08-02 DIAGNOSIS — N179 Acute kidney failure, unspecified: Secondary | ICD-10-CM | POA: Diagnosis present

## 2015-08-02 DIAGNOSIS — R42 Dizziness and giddiness: Secondary | ICD-10-CM | POA: Diagnosis not present

## 2015-08-02 DIAGNOSIS — N183 Chronic kidney disease, stage 3 (moderate): Secondary | ICD-10-CM | POA: Diagnosis present

## 2015-08-02 DIAGNOSIS — Z961 Presence of intraocular lens: Secondary | ICD-10-CM | POA: Diagnosis present

## 2015-08-02 DIAGNOSIS — I130 Hypertensive heart and chronic kidney disease with heart failure and stage 1 through stage 4 chronic kidney disease, or unspecified chronic kidney disease: Secondary | ICD-10-CM | POA: Diagnosis present

## 2015-08-02 DIAGNOSIS — R001 Bradycardia, unspecified: Principal | ICD-10-CM | POA: Diagnosis present

## 2015-08-02 DIAGNOSIS — Z87891 Personal history of nicotine dependence: Secondary | ICD-10-CM

## 2015-08-02 DIAGNOSIS — J449 Chronic obstructive pulmonary disease, unspecified: Secondary | ICD-10-CM | POA: Diagnosis present

## 2015-08-02 DIAGNOSIS — E785 Hyperlipidemia, unspecified: Secondary | ICD-10-CM | POA: Diagnosis present

## 2015-08-02 DIAGNOSIS — I2510 Atherosclerotic heart disease of native coronary artery without angina pectoris: Secondary | ICD-10-CM | POA: Diagnosis present

## 2015-08-02 DIAGNOSIS — F32A Depression, unspecified: Secondary | ICD-10-CM | POA: Diagnosis present

## 2015-08-02 DIAGNOSIS — K5900 Constipation, unspecified: Secondary | ICD-10-CM | POA: Diagnosis present

## 2015-08-02 DIAGNOSIS — I5033 Acute on chronic diastolic (congestive) heart failure: Secondary | ICD-10-CM | POA: Diagnosis not present

## 2015-08-02 DIAGNOSIS — I959 Hypotension, unspecified: Secondary | ICD-10-CM | POA: Diagnosis present

## 2015-08-02 DIAGNOSIS — Z9842 Cataract extraction status, left eye: Secondary | ICD-10-CM

## 2015-08-02 DIAGNOSIS — Z9841 Cataract extraction status, right eye: Secondary | ICD-10-CM

## 2015-08-02 DIAGNOSIS — I4892 Unspecified atrial flutter: Secondary | ICD-10-CM | POA: Diagnosis present

## 2015-08-02 DIAGNOSIS — R404 Transient alteration of awareness: Secondary | ICD-10-CM | POA: Diagnosis not present

## 2015-08-02 DIAGNOSIS — F329 Major depressive disorder, single episode, unspecified: Secondary | ICD-10-CM | POA: Diagnosis present

## 2015-08-02 DIAGNOSIS — Z8673 Personal history of transient ischemic attack (TIA), and cerebral infarction without residual deficits: Secondary | ICD-10-CM | POA: Diagnosis not present

## 2015-08-02 DIAGNOSIS — I480 Paroxysmal atrial fibrillation: Secondary | ICD-10-CM | POA: Diagnosis not present

## 2015-08-02 DIAGNOSIS — E222 Syndrome of inappropriate secretion of antidiuretic hormone: Secondary | ICD-10-CM | POA: Diagnosis present

## 2015-08-02 DIAGNOSIS — E871 Hypo-osmolality and hyponatremia: Secondary | ICD-10-CM | POA: Diagnosis present

## 2015-08-02 DIAGNOSIS — R0600 Dyspnea, unspecified: Secondary | ICD-10-CM

## 2015-08-02 DIAGNOSIS — R531 Weakness: Secondary | ICD-10-CM | POA: Diagnosis not present

## 2015-08-02 DIAGNOSIS — N184 Chronic kidney disease, stage 4 (severe): Secondary | ICD-10-CM | POA: Diagnosis present

## 2015-08-02 HISTORY — DX: Bradycardia, unspecified: R00.1

## 2015-08-02 LAB — CBC WITH DIFFERENTIAL/PLATELET
Basophils Absolute: 0.1 10*3/uL (ref 0.0–0.1)
Basophils Relative: 1 %
Eosinophils Absolute: 0.3 10*3/uL (ref 0.0–0.7)
Eosinophils Relative: 5 %
HCT: 28.3 % — ABNORMAL LOW (ref 36.0–46.0)
Hemoglobin: 9.6 g/dL — ABNORMAL LOW (ref 12.0–15.0)
Lymphocytes Relative: 22 %
Lymphs Abs: 1.1 10*3/uL (ref 0.7–4.0)
MCH: 31 pg (ref 26.0–34.0)
MCHC: 33.9 g/dL (ref 30.0–36.0)
MCV: 91.3 fL (ref 78.0–100.0)
Monocytes Absolute: 0.7 10*3/uL (ref 0.1–1.0)
Monocytes Relative: 14 %
Neutro Abs: 2.8 10*3/uL (ref 1.7–7.7)
Neutrophils Relative %: 58 %
Platelets: 363 10*3/uL (ref 150–400)
RBC: 3.1 MIL/uL — ABNORMAL LOW (ref 3.87–5.11)
RDW: 16.9 % — ABNORMAL HIGH (ref 11.5–15.5)
WBC: 4.9 10*3/uL (ref 4.0–10.5)

## 2015-08-02 LAB — TSH: TSH: 83.858 u[IU]/mL — ABNORMAL HIGH (ref 0.350–4.500)

## 2015-08-02 LAB — I-STAT CHEM 8, ED
BUN: 27 mg/dL — ABNORMAL HIGH (ref 6–20)
Calcium, Ion: 1.17 mmol/L (ref 1.13–1.30)
Chloride: 98 mmol/L — ABNORMAL LOW (ref 101–111)
Creatinine, Ser: 1.9 mg/dL — ABNORMAL HIGH (ref 0.44–1.00)
Glucose, Bld: 148 mg/dL — ABNORMAL HIGH (ref 65–99)
HCT: 32 % — ABNORMAL LOW (ref 36.0–46.0)
Hemoglobin: 10.9 g/dL — ABNORMAL LOW (ref 12.0–15.0)
Potassium: 5.2 mmol/L — ABNORMAL HIGH (ref 3.5–5.1)
Sodium: 131 mmol/L — ABNORMAL LOW (ref 135–145)
TCO2: 19 mmol/L (ref 0–100)

## 2015-08-02 LAB — BASIC METABOLIC PANEL
Anion gap: 10 (ref 5–15)
BUN: 26 mg/dL — ABNORMAL HIGH (ref 6–20)
CO2: 20 mmol/L — ABNORMAL LOW (ref 22–32)
Calcium: 9.2 mg/dL (ref 8.9–10.3)
Chloride: 99 mmol/L — ABNORMAL LOW (ref 101–111)
Creatinine, Ser: 1.95 mg/dL — ABNORMAL HIGH (ref 0.44–1.00)
GFR calc Af Amer: 25 mL/min — ABNORMAL LOW (ref >60)
GFR calc non Af Amer: 21 mL/min — ABNORMAL LOW (ref >60)
Glucose, Bld: 157 mg/dL — ABNORMAL HIGH (ref 65–99)
Potassium: 5.2 mmol/L — ABNORMAL HIGH (ref 3.5–5.1)
Sodium: 129 mmol/L — ABNORMAL LOW (ref 135–145)

## 2015-08-02 LAB — PROTIME-INR
INR: 1.24 (ref 0.00–1.49)
Prothrombin Time: 15.7 seconds — ABNORMAL HIGH (ref 11.6–15.2)

## 2015-08-02 LAB — MAGNESIUM: Magnesium: 1.8 mg/dL (ref 1.7–2.4)

## 2015-08-02 LAB — I-STAT TROPONIN, ED: Troponin i, poc: 0.01 ng/mL (ref 0.00–0.08)

## 2015-08-02 LAB — BRAIN NATRIURETIC PEPTIDE: B Natriuretic Peptide: 731.4 pg/mL — ABNORMAL HIGH (ref 0.0–100.0)

## 2015-08-02 MED ORDER — SENNOSIDES-DOCUSATE SODIUM 8.6-50 MG PO TABS
1.0000 | ORAL_TABLET | Freq: Two times a day (BID) | ORAL | Status: DC
  Administered 2015-08-02 – 2015-08-04 (×5): 1 via ORAL
  Filled 2015-08-02 (×9): qty 1

## 2015-08-02 MED ORDER — FUROSEMIDE 20 MG PO TABS
20.0000 mg | ORAL_TABLET | Freq: Every day | ORAL | Status: DC
  Administered 2015-08-03: 20 mg via ORAL
  Filled 2015-08-02: qty 1

## 2015-08-02 MED ORDER — LACTULOSE 10 GM/15ML PO SOLN: 10.0000 g | Freq: Every day | ORAL | Status: DC | PRN

## 2015-08-02 MED ORDER — POLYETHYL GLYCOL-PROPYL GLYCOL 0.4-0.3 % OP SOLN: 1.0000 [drp] | Freq: Four times a day (QID) | OPHTHALMIC | Status: DC

## 2015-08-02 MED ORDER — ALBUTEROL SULFATE HFA 108 (90 BASE) MCG/ACT IN AERS: 2.0000 | INHALATION_SPRAY | Freq: Four times a day (QID) | RESPIRATORY_TRACT | Status: DC | PRN

## 2015-08-02 MED ORDER — DOPAMINE-DEXTROSE 3.2-5 MG/ML-% IV SOLN
5.0000 ug/kg/min | INTRAVENOUS | Status: DC
  Administered 2015-08-02: 3 ug/kg/min via INTRAVENOUS
  Administered 2015-08-04: 5 ug/kg/min via INTRAVENOUS
  Filled 2015-08-02 (×3): qty 250

## 2015-08-02 MED ORDER — ASPIRIN EC 81 MG PO TBEC
81.0000 mg | DELAYED_RELEASE_TABLET | Freq: Every day | ORAL | Status: DC
  Administered 2015-08-03: 81 mg via ORAL
  Filled 2015-08-02: qty 1

## 2015-08-02 MED ORDER — TRAMADOL HCL 50 MG PO TABS
50.0000 mg | ORAL_TABLET | Freq: Two times a day (BID) | ORAL | Status: DC | PRN
  Filled 2015-08-02: qty 1

## 2015-08-02 MED ORDER — SODIUM CHLORIDE 0.9% FLUSH: 3.0000 mL | INTRAVENOUS | Status: DC | PRN

## 2015-08-02 MED ORDER — ASPIRIN 300 MG RE SUPP: 300.0000 mg | RECTAL | Status: AC

## 2015-08-02 MED ORDER — SODIUM CHLORIDE 0.9% FLUSH
3.0000 mL | Freq: Two times a day (BID) | INTRAVENOUS | Status: DC
  Administered 2015-08-02 – 2015-08-03 (×3): 3 mL via INTRAVENOUS

## 2015-08-02 MED ORDER — POLYETHYLENE GLYCOL 3350 17 G PO PACK: 17.0000 g | PACK | Freq: Every day | ORAL | Status: DC | PRN

## 2015-08-02 MED ORDER — CYCLOSPORINE 0.05 % OP EMUL
1.0000 [drp] | Freq: Two times a day (BID) | OPHTHALMIC | Status: DC
  Administered 2015-08-02 – 2015-08-05 (×6): 1 [drp] via OPHTHALMIC
  Filled 2015-08-02 (×6): qty 1

## 2015-08-02 MED ORDER — FLUTICASONE PROPIONATE 50 MCG/ACT NA SUSP
2.0000 | Freq: Every day | NASAL | Status: DC
  Administered 2015-08-03 – 2015-08-05 (×3): 2 via NASAL
  Filled 2015-08-02: qty 16

## 2015-08-02 MED ORDER — SERTRALINE HCL 100 MG PO TABS
100.0000 mg | ORAL_TABLET | Freq: Every day | ORAL | Status: DC
  Administered 2015-08-03 – 2015-08-05 (×3): 100 mg via ORAL
  Filled 2015-08-02 (×3): qty 1

## 2015-08-02 MED ORDER — ACETAMINOPHEN 325 MG PO TABS: 325.0000 mg | ORAL_TABLET | Freq: Four times a day (QID) | ORAL | Status: DC | PRN

## 2015-08-02 MED ORDER — NITROGLYCERIN 0.4 MG SL SUBL: 0.4000 mg | SUBLINGUAL_TABLET | SUBLINGUAL | Status: DC | PRN

## 2015-08-02 MED ORDER — PANTOPRAZOLE SODIUM 40 MG PO TBEC
40.0000 mg | DELAYED_RELEASE_TABLET | Freq: Two times a day (BID) | ORAL | Status: DC
  Administered 2015-08-02 – 2015-08-05 (×6): 40 mg via ORAL
  Filled 2015-08-02 (×6): qty 1

## 2015-08-02 MED ORDER — MOMETASONE FURO-FORMOTEROL FUM 200-5 MCG/ACT IN AERO
2.0000 | INHALATION_SPRAY | Freq: Two times a day (BID) | RESPIRATORY_TRACT | Status: DC
  Administered 2015-08-03 – 2015-08-05 (×5): 2 via RESPIRATORY_TRACT
  Filled 2015-08-02: qty 8.8

## 2015-08-02 MED ORDER — AMLODIPINE BESYLATE 10 MG PO TABS
10.0000 mg | ORAL_TABLET | Freq: Every day | ORAL | Status: DC
  Administered 2015-08-03: 10 mg via ORAL
  Filled 2015-08-02: qty 1

## 2015-08-02 MED ORDER — ADULT MULTIVITAMIN W/MINERALS CH
1.0000 | ORAL_TABLET | Freq: Every morning | ORAL | Status: DC
  Administered 2015-08-03 – 2015-08-05 (×3): 1 via ORAL
  Filled 2015-08-02 (×3): qty 1

## 2015-08-02 MED ORDER — ATORVASTATIN CALCIUM 10 MG PO TABS
10.0000 mg | ORAL_TABLET | Freq: Every day | ORAL | Status: DC
  Administered 2015-08-03 – 2015-08-05 (×3): 10 mg via ORAL
  Filled 2015-08-02 (×3): qty 1

## 2015-08-02 MED ORDER — POLYVINYL ALCOHOL 1.4 % OP SOLN: 1.0000 [drp] | OPHTHALMIC | Status: DC | PRN

## 2015-08-02 MED ORDER — FUROSEMIDE 10 MG/ML IJ SOLN
40.0000 mg | Freq: Once | INTRAMUSCULAR | Status: AC
  Administered 2015-08-02: 40 mg via INTRAVENOUS
  Filled 2015-08-02: qty 4

## 2015-08-02 MED ORDER — DICLOFENAC SODIUM 1 % TD GEL: 2.0000 g | Freq: Every day | TRANSDERMAL | Status: DC | PRN

## 2015-08-02 MED ORDER — ONDANSETRON HCL 4 MG/2ML IJ SOLN
4.0000 mg | Freq: Four times a day (QID) | INTRAMUSCULAR | Status: DC | PRN
  Administered 2015-08-02: 4 mg via INTRAVENOUS
  Filled 2015-08-02: qty 2

## 2015-08-02 MED ORDER — BOOST / RESOURCE BREEZE PO LIQD
1.0000 | Freq: Three times a day (TID) | ORAL | Status: DC
  Administered 2015-08-03 (×2): 1 via ORAL

## 2015-08-02 MED ORDER — ISOSORBIDE DINITRATE 10 MG PO TABS: 10.0000 mg | ORAL_TABLET | Freq: Three times a day (TID) | ORAL | Status: DC

## 2015-08-02 MED ORDER — RISPERIDONE 1 MG PO TABS
1.0000 mg | ORAL_TABLET | Freq: Every day | ORAL | Status: DC
  Administered 2015-08-03: 1 mg via ORAL
  Filled 2015-08-02: qty 1

## 2015-08-02 MED ORDER — HYDRALAZINE HCL 50 MG PO TABS
100.0000 mg | ORAL_TABLET | Freq: Three times a day (TID) | ORAL | Status: DC
  Filled 2015-08-02: qty 2

## 2015-08-02 MED ORDER — CALCIUM CARBONATE-VITAMIN D 500-200 MG-UNIT PO TABS
1.0000 | ORAL_TABLET | Freq: Every day | ORAL | Status: DC
  Administered 2015-08-03 – 2015-08-05 (×3): 1 via ORAL
  Filled 2015-08-02 (×3): qty 1

## 2015-08-02 MED ORDER — APIXABAN 2.5 MG PO TABS
2.5000 mg | ORAL_TABLET | Freq: Two times a day (BID) | ORAL | Status: DC
  Administered 2015-08-02 – 2015-08-05 (×6): 2.5 mg via ORAL
  Filled 2015-08-02 (×6): qty 1

## 2015-08-02 MED ORDER — ALBUTEROL SULFATE (2.5 MG/3ML) 0.083% IN NEBU: 2.5000 mg | INHALATION_SOLUTION | Freq: Four times a day (QID) | RESPIRATORY_TRACT | Status: DC | PRN

## 2015-08-02 MED ORDER — LEVOTHYROXINE SODIUM 25 MCG PO TABS
25.0000 ug | ORAL_TABLET | Freq: Every day | ORAL | Status: DC
  Administered 2015-08-03: 25 ug via ORAL
  Filled 2015-08-02: qty 1

## 2015-08-02 MED ORDER — GABAPENTIN 300 MG PO CAPS
300.0000 mg | ORAL_CAPSULE | Freq: Three times a day (TID) | ORAL | Status: DC
  Administered 2015-08-02 – 2015-08-05 (×8): 300 mg via ORAL
  Filled 2015-08-02 (×8): qty 1

## 2015-08-02 MED ORDER — LATANOPROST 0.005 % OP SOLN
1.0000 [drp] | Freq: Every day | OPHTHALMIC | Status: DC
  Administered 2015-08-02 – 2015-08-04 (×3): 1 [drp] via OPHTHALMIC
  Filled 2015-08-02: qty 2.5

## 2015-08-02 MED ORDER — SODIUM CHLORIDE 0.9 % IV SOLN: 250.0000 mL | INTRAVENOUS | Status: DC | PRN

## 2015-08-02 MED ORDER — ASPIRIN 81 MG PO CHEW
324.0000 mg | CHEWABLE_TABLET | ORAL | Status: AC
  Filled 2015-08-02: qty 4

## 2015-08-02 NOTE — Progress Notes (Signed)
Cardiology Office Note    Date:  08/02/2015   ID:  Shannon [name], DOB 07-11-1923, MRN [id number]  PCP:  Kirt Boys, DO  Cardiologist: Dr. Delton See  Chief complaint dizziness and chest pain   History of Present Illness:  Shannon [name] is a 82 y.o. female who previously had a cardiac catheterization in August 2012 at [facility] which showed 40% ostial LAD stenosis, normal EF, otherwise no significant CAD. She was admitted from 4/3 -4/8 originally for dysphagia. While down in radiology waiting after an aborted barium swallow, patient had sudden onset of shortness of breath requiring breathing treatment. She subsequently when into rapid atrial flutter with onset of chest pain. Cardiology was consulted. Subsequent troponin I and lactic acid were both elevated. She was placed on amiodarone and spontaneously converted to normal sinus rhythm. She also noted to have elevated creatinine of 1.6 c/w acute on chronic renal insufficiency. TSH was low and a free T4 was elevated at 2.0. VQ scan was negative for PE. Echocardiogram obtained on 07/08/2015 showed EF 60-65%, grade 1 diastolic dysfunction, mild AR, mild MR, moderately to severely dilated left atrium. EGD was eventually performed on 07/08/2015 which showed candidiasis throughout the entire esophagus, moderate pyloric stenosis. She was also placed on eliquis 2.5 mg twice a day. It was felt that her mildly elevated troponin was in the setting of atrial flutter with RVR and acute on chronic renal failure. She saw Azalee Course, [place] 07/23/15 for post hospital follow-up. She had 3+ pitting edema was in acute heart failure. She was started on Lasix 20 mg daily. Creatinines was 1.37 that day up from 1.24 hemoglobin was stable at 10.2  Patient called in today stating that she had 3 episodes of dizziness when standing up and she was added onto my schedule. Arrived in my office in her heart rate is 33. She is extremely dizzy with any change of position. She has  felt bad all day today. She's had associated chest tightness with it. Yesterday she did not feel this way. She is on amiodarone 200 mg daily and Toprol XL 100 mg daily. She took her medications this morning. She said her weight has not changed much since the addition of Lasix. His staying between 142 pounds and 144 home.    Past Medical History  Diagnosis Date  . Pneumonia   . Hypertension   . Small bowel obstruction (HCC)     "I don't know how many" (01/11/2015)  . Ventral hernia with bowel obstruction   . Asthma   . Hyperlipidemia   . Stroke (HCC)     "light one"  . COPD (chronic obstructive pulmonary disease) (HCC)   . Hypothyroidism   . Type II diabetes mellitus (HCC)   . Anemia     previous blood transfusions  . GERD (gastroesophageal reflux disease)   . History of stomach ulcers   . Arthritis     "all over"  . Renal insufficiency   . Depression     "light case"  . SIADH (syndrome of inappropriate ADH production) (HCC)     [name] 01/10/2015  . Perforated gastric ulcer (HCC)   . Gastric stenosis   . Paraesophageal hernia     Past Surgical History  Procedure Laterality Date  . Tubal ligation    . Colectomy    . Hernia repair  2015  . Esophagogastroduodenoscopy N/A 01/19/2014    Procedure: ESOPHAGOGASTRODUODENOSCOPY (EGD);  Surgeon: Hilarie Fredrickson, MD;  Location: [facility];  Service:  Endoscopy;  Laterality: N/A;  . Esophagogastroduodenoscopy N/A 01/20/2014    Procedure: ESOPHAGOGASTRODUODENOSCOPY (EGD);  Surgeon: Hilarie Fredrickson, MD;  Location: [facility];  Service: Endoscopy;  Laterality: N/A;  . Esophagogastroduodenoscopy N/A 03/19/2014    Procedure: ESOPHAGOGASTRODUODENOSCOPY (EGD);  Surgeon: Rachael Fee, MD;  Location: [facility];  Service: Endoscopy;  Laterality: N/A;  . Cholecystectomy open    . Ventral hernia repair  2015    incarcerated ventral hernia ([facility] 09/2013)/notes 01/10/2015  . Cataract extraction w/ intraocular lens  implant, bilateral    .  Gastrojejunostomy      hx/notes 01/10/2015  . Laparotomy N/A 01/20/2015    Procedure: EXPLORATORY LAPAROTOMY;  Surgeon: Abigail Miyamoto, MD;  Location: [facility] OR;  Service: General;  Laterality: N/A;  . Lysis of adhesion N/A 01/20/2015    Procedure: LYSIS OF ADHESIONS < 1 HOUR;  Surgeon: Abigail Miyamoto, MD;  Location: [facility] OR;  Service: General;  Laterality: N/A;  . Esophagogastroduodenoscopy N/A 07/08/2015    Procedure: ESOPHAGOGASTRODUODENOSCOPY (EGD);  Surgeon: Sherrilyn Rist, MD;  Location: [facility]COPY;  Service: Endoscopy;  Laterality: N/A;    Current Medications: Outpatient Prescriptions Prior to Visit  Medication Sig Dispense Refill  . acetaminophen (TYLENOL) 325 MG tablet Take 325 mg by mouth every 6 (six) hours as needed for mild pain or fever.     [name] ADVAIR DISKUS 250-50 MCG/DOSE AEPB INHALE 1 PUFF INTO THE LUNGS 2 (TWO) TIMES DAILY AS NEEDED FOR SHORTNESS OF BREATH  3  . albuterol (PROVENTIL HFA;VENTOLIN HFA) 108 (90 Base) MCG/ACT inhaler Inhale 2 puffs into the lungs every 6 (six) hours as needed for wheezing or shortness of breath. 1 Inhaler 2  . amiodarone (PACERONE) 200 MG tablet Take 1 tablet (200 mg total) by mouth daily. 30 tablet 0  . amLODipine (NORVASC) 10 MG tablet TAKE 1 TABLET BY MOUTH DAILY FOR HYPERTENSION 30 tablet 3  . atorvastatin (LIPITOR) 10 MG tablet Take 10 mg by mouth daily.    [name] b complex vitamins tablet Take 1 tablet by mouth daily.     . calcium-vitamin D (OSCAL WITH D) 500-200 MG-UNIT per tablet Take 1 tablet by mouth daily with breakfast.     . chlorhexidine (PERIDEX) 0.12 % solution Use as directed 15 mLs in the mouth or throat 2 (two) times daily.    . [employer] GAS RELIEF 80 MG chewable tablet CHEW 1 TABLET BY MOUTH 4 TIMES A DAY AS NEEDED FOR FLATULENCE 30 tablet 0  . diclofenac sodium (VOLTAREN) 1 % GEL Apply 2 g topically daily as needed (for pain).     . feeding supplement (BOOST / RESOURCE BREEZE) LIQD Take 1 Container by mouth 3 (three) times daily  between meals. 90 Container 0  . fluticasone (FLONASE) 50 MCG/ACT nasal spray Place 2 sprays into both nostrils daily. 16 g 3  . furosemide (LASIX) 20 MG tablet Take 1 tablet (20 mg total) by mouth daily. 30 tablet 5  . gabapentin (NEURONTIN) 300 MG capsule TAKE 1 CAPSULE (300 MG TOTAL) BY MOUTH 3 (THREE) TIMES DAILY. 90 capsule 3  . hydrALAZINE (APRESOLINE) 100 MG tablet TAKE 1 TABLET (100 MG TOTAL) BY MOUTH 3 (THREE) TIMES DAILY. 90 tablet 3  . isosorbide dinitrate (ISORDIL) 10 MG tablet Take 10 mg by mouth 3 (three) times daily. If chest pains occur increase to two tablets daily    . isosorbide mononitrate (IMDUR) 30 MG 24 hr tablet TAKE 1 TABLET (30 MG TOTAL) BY MOUTH DAILY.  2  .  lactulose (CHRONULAC) 10 GM/15ML solution Take 15 mLs (10 g total) by mouth daily as needed for mild constipation. 240 mL 0  . latanoprost (XALATAN) 0.005 % ophthalmic solution Place 1 drop into both eyes at bedtime. 2.5 mL 12  . levothyroxine (SYNTHROID, LEVOTHROID) 25 MCG tablet Take 1 tablet (25 mcg total) by mouth daily before breakfast. 30 tablet 6  . metoprolol succinate (TOPROL-XL) 100 MG 24 hr tablet Take 1 tablet (100 mg total) by mouth daily. 30 tablet 0  . Multiple Vitamins-Minerals (MULTIVITAMIN WITH MINERALS) tablet Take 1 tablet by mouth every morning.     . pantoprazole (PROTONIX) 40 MG tablet TAKE 1 TABLET BY MOUTH TWICE A DAY 60 tablet 11  . Polyethyl Glycol-Propyl Glycol 0.4-0.3 % SOLN Place 1 drop into both eyes 4 (four) times daily.     . polyethylene glycol (MIRALAX / GLYCOLAX) packet Take 17 g by mouth daily as needed for moderate constipation.    . RESTASIS 0.05 % ophthalmic emulsion USE 1 DROP INTO BOTH EYES TWICE DAILY 60 mL 1  . risperiDONE (RISPERDAL) 1 MG tablet Take 1 mg by mouth daily.     . sennosides-docusate sodium (SENOKOT-S) 8.6-50 MG tablet Take 1 tablet by mouth 2 (two) times daily.     . sertraline (ZOLOFT) 100 MG tablet TAKE 1 TABLET BY MOUTH ONCE DAILY FOR DEPRESSION 30 tablet 5   . sodium chloride 1 g tablet TAKE 1 TABLET BY MOUTH THREE TIMES DAILY 90 tablet 2  . traMADol (ULTRAM) 50 MG tablet Take 50 mg by mouth 2 (two) times daily as needed for moderate pain.     [name] apixaban (ELIQUIS) 2.5 MG TABS tablet Take one twice daily for anticoagulation to prevent stroke. 60 tablet 11   No facility-administered medications prior to visit.     Allergies:   Review of patient's allergies indicates no known allergies.   Social History   Social History  . Marital Status: Widowed    Spouse Name: N/A  . Number of Children: 3  . Years of Education: N/A   Social History Main Topics  . Smoking status: Never Smoker   . Smokeless tobacco: Former Neurosurgeon    Types: Snuff  . Alcohol Use: No  . Drug Use: No  . Sexual Activity: No   Other Topics Concern  . None   Social History Narrative   Married in [place], lives in one story house with 2 people and no pets   Occupation: ?   Has a living Will, [place], and doesn't have DNR   Was living with Husband (in frail health) in [place] until 09/2013.  After her [facility] admission they both came to live with daughter in [place]     Family History:  The patient's   family history includes Diabetes in her brother; Hypertension in her mother; Stroke in her mother. There is no history of Heart attack.   ROS:   Please see the history of present illness.    Review of Systems  Constitution: Positive for weakness, malaise/fatigue and weight gain.  HENT: Positive for hearing loss.   Cardiovascular: Positive for chest pain, dyspnea on exertion and palpitations.  Respiratory: Positive for shortness of breath.   Hematologic/Lymphatic: Negative.   Skin: Positive for nail changes and poor wound healing.  Musculoskeletal: Negative.  Negative for joint pain.  Gastrointestinal: Negative.   Genitourinary: Positive for bladder incontinence and frequency.  Neurological: Positive for dizziness and light-headedness.   All other systems reviewed and are  negative.   PHYSICAL EXAM:   VS:  BP 116/77 mmHg  Pulse 37  Ht 5\' 5"  (1.651 m)  Wt 153 lb 12.8 oz (69.763 kg)  BMI 25.59 kg/m2  SpO2 92%   GEN: Well nourished, well developed, very dizzy  Neck: no JVD, carotid bruits, or masses Cardiac: Irregular at 30 bpm; no murmurs, rubs, or gallops, +1-2 edema, right third toe was cyanotic changes and healing lesion being followed by [facility] Respiratory:  clear to auscultation bilaterally, normal work of breathing GI: soft, nontender, nondistended, + BS MS: no deformity or atrophy Skin: warm and dry, no rash Neuro:  Alert and Oriented x 3, Strength and sensation are intact Psych: euthymic mood, full affect  Wt Readings from Last 3 Encounters:  08/02/15 153 lb 12.8 oz (69.763 kg)  07/23/15 148 lb 12.8 oz (67.495 kg)  07/22/15 164 lb (74.39 kg)      Studies/Labs Reviewed:   EKG:  EKG is  ordered today.  The ekg ordered today demonstrates Sinus bradycardia 33 bpm nonspecific ST-T wave changes  Recent Labs: 01/22/2015: Magnesium 1.9 06/20/2015: B Natriuretic Peptide 238.3* 07/14/2015: ALT 16; TSH 4.260 07/23/2015: BUN 16; Creat 1.37*; Hemoglobin 10.2*; Platelets 285; Potassium 4.8; Sodium 135   Lipid Panel    Component Value Date/Time   CHOL 126 04/07/2015 1015   CHOL 110 12/24/2013 0456   TRIG 107 04/07/2015 1015   HDL 69 04/07/2015 1015   HDL 58 12/24/2013 0456   CHOLHDL 1.8 04/07/2015 1015   CHOLHDL 1.9 12/24/2013 0456   VLDL 26 12/24/2013 0456   LDLCALC 36 04/07/2015 1015   LDLCALC 26 12/24/2013 0456    Additional studies/ records that were reviewed today include:    Echo 07/08/2015 LV EF: 60% -   65%   ------------------------------------------------------------------- Indications:      CHF - 428.0.   ------------------------------------------------------------------- History:   PMH:   Atrial flutter.  Chronic obstructive pulmonary disease.  PMH:   Stroke.  Risk factors:  Hypertension. Diabetes mellitus.  Dyslipidemia.   ------------------------------------------------------------------- Study Conclusions   - Left ventricle: The cavity size was normal. Wall thickness was   increased in a pattern of mild LVH. Systolic function was normal.   The estimated ejection fraction was in the range of 60% to 65%.   Doppler parameters are consistent with abnormal left ventricular   relaxation (grade 1 diastolic dysfunction). - Aortic valve: There was mild regurgitation. - Mitral valve: There was mild regurgitation. - Left atrium: The atrium was moderately to severely dilated.   Review of the above records demonstrates:   Recently admitted for esophagitis secondary to candidiasis, had new onset of rapid atrial flutter converted on amiodarone     ASSESSMENT:    1. Lightheadedness   2. Dizziness   3. Chest heaviness   4. Bradycardia   5. Paroxysmal atrial fibrillation (HCC)   6. Essential hypertension, benign   7. Acute on chronic diastolic heart failure (HCC)      PLAN:  In order of problems listed above:  Patient is severely bradycardic. We are admitting her to the hospital for monitoring and to let the amiodarone and metoprolol washout. She does have history of rapid atrial fib/flutter and will probably need some medications/possible pacemaker. Discussed with Dr. Tenny Craw who concurs. She also has acute on chronic diastolic heart failure and could benefit from IV Lasix.  Medication Adjustments/Labs and Tests Ordered: Current medicines are reviewed at length with the patient today.  Concerns regarding medicines are outlined above.  Medication changes, Labs and Tests ordered today are listed in the Patient Instructions below. There are no Patient Instructions on file for this visit.   Elson Clan, PA-C  08/02/2015 3:08 PM    [facility] [address] [place], [place], [place]  [address] Phone: [phone]; Fax: [phone]

## 2015-08-02 NOTE — ED Notes (Signed)
Cardiology at bedside.

## 2015-08-02 NOTE — ED Notes (Signed)
Pt brought EMS from PMD for bradycardia.  Pt was being seen by PMD for generalized weakness that began yesterday.  Pt was found to be bradycardic in the 30's.  Pt daughter reports that pt medications have recently been changed and they changed her Metoprolol dose from 25mg  to 100mg .

## 2015-08-02 NOTE — H&P (Signed)
Date: 08/02/2015   H&P  ID: Shannon [name], DOB [birth year], MRN [id number]  PCP: Kirt Boys, DO Cardiologist: Dr. Delton See  Chief complaint dizziness and chest pain   History of Present Illness:  Shannon [name] is a 82 y.o. female who previously had a cardiac catheterization in August 2012 at [facility] which showed 40% ostial LAD stenosis, normal EF, otherwise no significant CAD. She was admitted from 4/3 -07/10/15 originally for dysphagia. While down in radiology waiting after an aborted barium swallow, patient had sudden onset of shortness of breath requiring breathing treatment. She subsequently when into rapid atrial flutter with onset of chest pain. Cardiology was consulted. Subsequent troponin I and lactic acid were both elevated. She was placed on amiodarone and spontaneously converted to normal sinus rhythm. She also noted to have elevated creatinine of 1.6 c/w acute on chronic renal insufficiency. TSH was low and a free T4 was elevated at 2.0. VQ scan was negative for PE. Echocardiogram obtained on 07/08/2015 showed EF 60-65%, grade 1 diastolic dysfunction, mild AR, mild MR, moderately to severely dilated left atrium. EGD was eventually performed on 07/08/2015 which showed candidiasis throughout the entire esophagus, moderate pyloric stenosis. She was also placed on eliquis 2.5 mg twice a day. It was felt that her mildly elevated troponin was in the setting of atrial flutter with RVR and acute on chronic renal failure. She saw Azalee Course, [place] 07/23/15 for post hospital follow-up. She had 3+ pitting edema was in acute heart failure. She was started on Lasix 20 mg daily. Creatinines was 1.37 that day up from 1.24 hemoglobin was stable at 10.2  Patient called in today stating that she had 3 episodes of dizziness when standing up and she was added onto the office schedule. She was seen by Jacolyn Reedy PA. When the pt arrived in the office her heart rate was 33. She is extremely dizzy  with any change of position. She has felt bad all day today. She's had associated chest tightness with it. Yesterday she did not feel this way. She is on amiodarone 200 mg daily and Toprol XL 100 mg daily. She took her medications this morning. She said her weight has not changed much since the addition of Lasix. Her wgt is staying between 142 pounds and 144 home.    Past Medical History  Diagnosis Date  . Pneumonia   . Hypertension   . Small bowel obstruction (HCC)     "I don't know how many" (01/11/2015)  . Ventral hernia with bowel obstruction   . Asthma   . Hyperlipidemia   . Stroke (HCC)     "light one"  . COPD (chronic obstructive pulmonary disease) (HCC)   . Hypothyroidism   . Type II diabetes mellitus (HCC)   . Anemia     previous blood transfusions  . GERD (gastroesophageal reflux disease)   . History of stomach ulcers   . Arthritis     "all over"  . Renal insufficiency   . Depression     "light case"  . SIADH (syndrome of inappropriate ADH production) (HCC)     [name] 01/10/2015  . Perforated gastric ulcer (HCC)   . Gastric stenosis   . Paraesophageal hernia     Past Surgical History  Procedure Laterality Date  . Tubal ligation    . Colectomy    . Hernia repair  2015  . Esophagogastroduodenoscopy N/A 01/19/2014    Procedure: ESOPHAGOGASTRODUODENOSCOPY (EGD); Surgeon: Hilarie Fredrickson, MD; Location: [facility]; Service:  Endoscopy; Laterality: N/A;  . Esophagogastroduodenoscopy N/A 01/20/2014    Procedure: ESOPHAGOGASTRODUODENOSCOPY (EGD); Surgeon: Hilarie Fredrickson, MD; Location: [facility]; Service: Endoscopy; Laterality: N/A;  . Esophagogastroduodenoscopy N/A 03/19/2014    Procedure: ESOPHAGOGASTRODUODENOSCOPY (EGD); Surgeon: Rachael Fee, MD; Location: [facility]; Service: Endoscopy; Laterality: N/A;  . Cholecystectomy open     . Ventral hernia repair  2015    incarcerated ventral hernia ([facility] 09/2013)/notes 01/10/2015  . Cataract extraction w/ intraocular lens implant, bilateral    . Gastrojejunostomy      hx/notes 01/10/2015  . Laparotomy N/A 01/20/2015    Procedure: EXPLORATORY LAPAROTOMY; Surgeon: Abigail Miyamoto, MD; Location: [facility] OR; Service: General; Laterality: N/A;  . Lysis of adhesion N/A 01/20/2015    Procedure: LYSIS OF ADHESIONS < 1 HOUR; Surgeon: Abigail Miyamoto, MD; Location: [facility] OR; Service: General; Laterality: N/A;  . Esophagogastroduodenoscopy N/A 07/08/2015    Procedure: ESOPHAGOGASTRODUODENOSCOPY (EGD); Surgeon: Sherrilyn Rist, MD; Location: [facility] ENDOSCOPY; Service: Endoscopy; Laterality: N/A;    Current Medications: Outpatient Prescriptions Prior to Visit  Medication Sig Dispense Refill  . acetaminophen (TYLENOL) 325 MG tablet Take 325 mg by mouth every 6 (six) hours as needed for mild pain or fever.     [name] ADVAIR DISKUS 250-50 MCG/DOSE AEPB INHALE 1 PUFF INTO THE LUNGS 2 (TWO) TIMES DAILY AS NEEDED FOR SHORTNESS OF BREATH  3  . albuterol (PROVENTIL HFA;VENTOLIN HFA) 108 (90 Base) MCG/ACT inhaler Inhale 2 puffs into the lungs every 6 (six) hours as needed for wheezing or shortness of breath. 1 Inhaler 2  . amiodarone (PACERONE) 200 MG tablet Take 1 tablet (200 mg total) by mouth daily. 30 tablet 0  . amLODipine (NORVASC) 10 MG tablet TAKE 1 TABLET BY MOUTH DAILY FOR HYPERTENSION 30 tablet 3  . atorvastatin (LIPITOR) 10 MG tablet Take 10 mg by mouth daily.    [name] b complex vitamins tablet Take 1 tablet by mouth daily.     . calcium-vitamin D (OSCAL WITH D) 500-200 MG-UNIT per tablet Take 1 tablet by mouth daily with breakfast.     . chlorhexidine (PERIDEX) 0.12 % solution Use as directed 15 mLs in the mouth or throat 2 (two) times daily.    . [employer] GAS RELIEF 80 MG chewable tablet CHEW 1 TABLET BY  MOUTH 4 TIMES A DAY AS NEEDED FOR FLATULENCE 30 tablet 0  . diclofenac sodium (VOLTAREN) 1 % GEL Apply 2 g topically daily as needed (for pain).     . feeding supplement (BOOST / RESOURCE BREEZE) LIQD Take 1 Container by mouth 3 (three) times daily between meals. 90 Container 0  . fluticasone (FLONASE) 50 MCG/ACT nasal spray Place 2 sprays into both nostrils daily. 16 g 3  . furosemide (LASIX) 20 MG tablet Take 1 tablet (20 mg total) by mouth daily. 30 tablet 5  . gabapentin (NEURONTIN) 300 MG capsule TAKE 1 CAPSULE (300 MG TOTAL) BY MOUTH 3 (THREE) TIMES DAILY. 90 capsule 3  . hydrALAZINE (APRESOLINE) 100 MG tablet TAKE 1 TABLET (100 MG TOTAL) BY MOUTH 3 (THREE) TIMES DAILY. 90 tablet 3  . isosorbide dinitrate (ISORDIL) 10 MG tablet Take 10 mg by mouth 3 (three) times daily. If chest pains occur increase to two tablets daily    . isosorbide mononitrate (IMDUR) 30 MG 24 hr tablet TAKE 1 TABLET (30 MG TOTAL) BY MOUTH DAILY.  2  . lactulose (CHRONULAC) 10 GM/15ML solution Take 15 mLs (10 g total) by mouth daily as needed for mild constipation. 240 mL 0  .  latanoprost (XALATAN) 0.005 % ophthalmic solution Place 1 drop into both eyes at bedtime. 2.5 mL 12  . levothyroxine (SYNTHROID, LEVOTHROID) 25 MCG tablet Take 1 tablet (25 mcg total) by mouth daily before breakfast. 30 tablet 6  . metoprolol succinate (TOPROL-XL) 100 MG 24 hr tablet Take 1 tablet (100 mg total) by mouth daily. 30 tablet 0  . Multiple Vitamins-Minerals (MULTIVITAMIN WITH MINERALS) tablet Take 1 tablet by mouth every morning.     . pantoprazole (PROTONIX) 40 MG tablet TAKE 1 TABLET BY MOUTH TWICE A DAY 60 tablet 11  . Polyethyl Glycol-Propyl Glycol 0.4-0.3 % SOLN Place 1 drop into both eyes 4 (four) times daily.     . polyethylene glycol (MIRALAX / GLYCOLAX) packet Take 17 g by mouth daily as needed for moderate constipation.    . RESTASIS 0.05 %  ophthalmic emulsion USE 1 DROP INTO BOTH EYES TWICE DAILY 60 mL 1  . risperiDONE (RISPERDAL) 1 MG tablet Take 1 mg by mouth daily.     . sennosides-docusate sodium (SENOKOT-S) 8.6-50 MG tablet Take 1 tablet by mouth 2 (two) times daily.     . sertraline (ZOLOFT) 100 MG tablet TAKE 1 TABLET BY MOUTH ONCE DAILY FOR DEPRESSION 30 tablet 5  . sodium chloride 1 g tablet TAKE 1 TABLET BY MOUTH THREE TIMES DAILY 90 tablet 2  . traMADol (ULTRAM) 50 MG tablet Take 50 mg by mouth 2 (two) times daily as needed for moderate pain.     [name] apixaban (ELIQUIS) 2.5 MG TABS tablet Take one twice daily for anticoagulation to prevent stroke. 60 tablet 11   No facility-administered medications prior to visit.     Allergies: Review of patient's allergies indicates no known allergies.   Social History   Social History  . Marital Status: Widowed    Spouse Name: N/A  . Number of Children: 3  . Years of Education: N/A   Social History Main Topics  . Smoking status: Never Smoker   . Smokeless tobacco: Former Neurosurgeon    Types: Snuff  . Alcohol Use: No  . Drug Use: No  . Sexual Activity: No   Other Topics Concern  . None   Social History Narrative   Married in [place], lives in one story house with 2 people and no pets   Occupation: ?   Has a living Will, [place], and doesn't have DNR   Was living with Husband (in frail health) in [place] until 09/2013. After her [facility] admission they both came to live with daughter in [place]     Family History: The patient's family history includes Diabetes in her brother; Hypertension in her mother; Stroke in her mother. There is no history of Heart attack.   ROS:  Please see the history of present illness.  Review of Systems  Constitution: Positive for weakness, malaise/fatigue and weight gain.  HENT: Positive for hearing loss.  Cardiovascular: Positive for chest  pain, dyspnea on exertion and palpitations.  Respiratory: Positive for shortness of breath.  Hematologic/Lymphatic: Negative.  Skin: Positive for nail changes and poor wound healing.  Musculoskeletal: Negative. Negative for joint pain.  Gastrointestinal: Negative.  Genitourinary: Positive for bladder incontinence and frequency.  Neurological: Positive for dizziness and light-headedness.   All other systems reviewed and are negative.   PHYSICAL EXAM:   VS: BP 116/77 mmHg  Pulse 37  Ht 5\' 5"  (1.651 m)  Wt 153 lb 12.8 oz (69.763 kg)  BMI 25.59 kg/m2  SpO2 92%  GEN: Well  nourished, well developed, very dizzy  Neck: no JVD, carotid bruits, or masses Cardiac: Irregular at 30 bpm; no murmurs, rubs, or gallops, +1-2 edema, right third toe was cyanotic changes and healing lesion being followed by [facility] Respiratory: SOB at rest, bilateral rales 1/3 up GI: soft, nontender, nondistended, + BS MS: no deformity or atrophy  Skin: warm and dry, no rash Neuro: Alert and Oriented x 3, Strength and sensation are intact Psych: euthymic mood, full affect  Wt Readings from Last 3 Encounters:  08/02/15 153 lb 12.8 oz (69.763 kg)  07/23/15 148 lb 12.8 oz (67.495 kg)  07/22/15 164 lb (74.39 kg)      Studies/Labs Reviewed:   EKG: EKG is ordered today. The ekg ordered today demonstrates Sinus bradycardia 33 bpm nonspecific ST-T wave changes  Recent Labs: 01/22/2015: Magnesium 1.9 06/20/2015: B Natriuretic Peptide 238.3* 07/14/2015: ALT 16; TSH 4.260 07/23/2015: BUN 16; Creat 1.37*; Hemoglobin 10.2*; Platelets 285; Potassium 4.8; Sodium 135   Lipid Panel  Labs (Brief)       Component Value Date/Time   CHOL 126 04/07/2015 1015   CHOL 110 12/24/2013 0456   TRIG 107 04/07/2015 1015   HDL 69 04/07/2015 1015   HDL 58 12/24/2013 0456   CHOLHDL 1.8 04/07/2015 1015   CHOLHDL 1.9 12/24/2013 0456   VLDL 26 12/24/2013 0456    LDLCALC 36 04/07/2015 1015   LDLCALC 26 12/24/2013 0456      Additional studies/ records that were reviewed today include:    Echo 07/08/2015 LV EF: 60% - 65%  ------------------------------------------------------------------- Indications: CHF - 428.0.  ------------------------------------------------------------------- History: PMH: Atrial flutter. Chronic obstructive pulmonary disease. PMH: Stroke. Risk factors: Hypertension. Diabetes mellitus. Dyslipidemia.  ------------------------------------------------------------------- Study Conclusions  - Left ventricle: The cavity size was normal. Wall thickness was  increased in a pattern of mild LVH. Systolic function was normal.  The estimated ejection fraction was in the range of 60% to 65%.  Doppler parameters are consistent with abnormal left ventricular  relaxation (grade 1 diastolic dysfunction). - Aortic valve: There was mild regurgitation. - Mitral valve: There was mild regurgitation. - Left atrium: The atrium was moderately to severely dilated.   Review of the above records demonstrates:   Recently admitted for esophagitis secondary to candidiasis, had new onset of rapid atrial flutter converted on amiodarone     ASSESSMENT:    1. Lightheadedness   2. Dizziness   3. Chest heaviness   4. Bradycardia   5. Paroxysmal atrial fibrillation (HCC)   6. Essential hypertension, benign   7. Acute on chronic diastolic heart failure (HCC)      PLAN:    Patient is severely bradycardic. She was sent to the hospital for admission and monitoring and to let the amiodarone and metoprolol washout. She does have history of rapid atrial fib/flutter and will probably need some medications/possible pacemaker. Ms Geni Bers discussed this with Dr. Tenny Craw who concured.       She also has acute on chronic diastolic heart failure- IV Lasix 40 mg x 1, and  IV Dopmamine to help get her HR  up.  I discussed Code status with the pt's daughter and she confirmed- limited code, no CPR, Intubation, or defibrillation. Medications OK.    Corine Shelter PA-C 08/02/2015 4:23 PM As above, patient seen and examined. Briefly she is a 82 year old female With past medical history of hypertension, diabetes mellitus, COPD And recent atrial flutter for evaluation of bradycardia and chest pain. Patient recently developed atrial flutter and was placed  on amiodarone and Toprol. Echocardiogram showed preserved LV function. She converted to sinus rhythm. She was also treated with apixaban. She presented today with complaints of chest pain and episodes of weakness. She also described fatigue and increased dyspnea on exertion. Electrocardiogram showed junctional rhythm with heart rate in the 30s. Plan to admit and cycle enzymes. Hold Toprol and amiodarone. Hopefully her heart rate will improve. If possible we would resume low-dose amiodarone if heart rate tolerates to help maintain sinus rhythm and continue off of Toprol. If her heart rate does not improve she would require pacemaker. In terms of her chest pain I would prefer to treat medically given age and comorbidities. She is in agreement. Some of her chest pain may be related to bradycardia with diminished perfusion of her coronaries. She also has some degree of congestive heart failure possibly related to low cardiac output from bradycardia. She has been placed on low-dose dopamine  To potentially help heart rate and we will give Lasix 40 mg IV 1. Note she is a limited code with no CPR, no defibrillation and no intubation. Olga Millers

## 2015-08-02 NOTE — Telephone Encounter (Signed)
Spoke with daughter, DPR on file.   Daughter states pt went to bathroom this morning and when she got up she said she felt like she was going to pass out. Pt was taken to bed to rest. Daughter states that this has happened 3x in the last week and when it occurs pt becomes clammy, lightheaded and has chest fullness. Pt did take meds and ate this morning. Vitals: 150/73, 51. Spoke with Dr. Meda Coffee and she said to have pt come in today to be seen if possible. Spoke with daughter and scheduled pt to see Ermalinda Barrios, PA-C today at 2:15pm.   Daughter verbalized understanding and was in agreement with plan.

## 2015-08-02 NOTE — Telephone Encounter (Signed)
Pt's dtr called to say pt having episodes of clamminess, close to passing out since last week-has had about 3 so far including on today -pls call dtr

## 2015-08-02 NOTE — ED Provider Notes (Signed)
CSN: [id number]     Arrival date & time 08/02/15  1535 History   First MD Initiated Contact with Patient 08/02/15 1537     Chief Complaint  Patient presents with  . Bradycardia     (Consider location/radiation/quality/duration/timing/severity/associated sxs/prior Treatment) HPI   Shannon [name] is a 82 y.o. female who presents for evaluation of dizzy episodes, with bradycardia, from her cardiologist office. She has had spells on and off since yesterday of increasing dizziness. She also has a sensation of "gas pain in her left chest and left upper abdomen. This discomfort began today. She denies nausea, vomiting, diaphoresis, weakness or paresthesia. Recently started on amiodarone, and metoprolol, after an episode of atrial flutter. She is also on xarelto. There are no other known modifying factors   Past Medical History  Diagnosis Date  . Pneumonia   . Hypertension   . Small bowel obstruction ([place])     "I don't know how many" (01/11/2015)  . Ventral hernia with bowel obstruction   . Asthma   . Hyperlipidemia   . Stroke ([place])     "light one"  . COPD (chronic obstructive pulmonary disease) ([place])   . Hypothyroidism   . Type II diabetes mellitus ([place])   . Anemia     previous blood transfusions  . GERD (gastroesophageal reflux disease)   . History of stomach ulcers   . Arthritis     "all over"  . Renal insufficiency   . Depression     "light case"  . SIADH (syndrome of inappropriate ADH production) ([place])     [name] 01/10/2015  . Perforated gastric ulcer ([place])   . Gastric stenosis   . Paraesophageal hernia    Past Surgical History  Procedure Laterality Date  . Tubal ligation    . Colectomy    . Hernia repair  2015  . Esophagogastroduodenoscopy N/A 01/19/2014    Procedure: ESOPHAGOGASTRODUODENOSCOPY (EGD);  Surgeon: Irene Shipper, MD;  Location: [facility];  Service: Endoscopy;  Laterality: N/A;  . Esophagogastroduodenoscopy N/A 01/20/2014    Procedure:  ESOPHAGOGASTRODUODENOSCOPY (EGD);  Surgeon: Irene Shipper, MD;  Location: [facility];  Service: Endoscopy;  Laterality: N/A;  . Esophagogastroduodenoscopy N/A 03/19/2014    Procedure: ESOPHAGOGASTRODUODENOSCOPY (EGD);  Surgeon: Milus Banister, MD;  Location: [facility];  Service: Endoscopy;  Laterality: N/A;  . Cholecystectomy open    . Ventral hernia repair  2015    incarcerated ventral hernia ([facility] 09/2013)/notes 01/10/2015  . Cataract extraction w/ intraocular lens  implant, bilateral    . Gastrojejunostomy      hx/notes 01/10/2015  . Laparotomy N/A 01/20/2015    Procedure: EXPLORATORY LAPAROTOMY;  Surgeon: Coralie Keens, MD;  Location: [facility];  Service: General;  Laterality: N/A;  . Lysis of adhesion N/A 01/20/2015    Procedure: LYSIS OF ADHESIONS < 1 HOUR;  Surgeon: Coralie Keens, MD;  Location: [facility];  Service: General;  Laterality: N/A;  . Esophagogastroduodenoscopy N/A 07/08/2015    Procedure: ESOPHAGOGASTRODUODENOSCOPY (EGD);  Surgeon: Doran Stabler, MD;  Location: [facility] ENDOSCOPY;  Service: Endoscopy;  Laterality: N/A;   Family History  Problem Relation Age of Onset  . Stroke Mother   . Heart attack Neg Hx   . Hypertension Mother   . Diabetes Brother    Social History  Substance Use Topics  . Smoking status: Never Smoker   . Smokeless tobacco: Former Systems developer    Types: Snuff  . Alcohol Use: No   OB History    No data  available     Review of Systems  All other systems reviewed and are negative.     Allergies  Review of patient's allergies indicates no known allergies.  Home Medications   Prior to Admission medications   Medication Sig Start Date End Date Taking? Authorizing Provider  acetaminophen (TYLENOL) 325 MG tablet Take 325 mg by mouth every 6 (six) hours as needed for mild pain or fever.     Historical Provider, MD  ADVAIR DISKUS 250-50 MCG/DOSE AEPB INHALE 1 PUFF INTO THE LUNGS 2 (TWO) TIMES DAILY AS NEEDED FOR SHORTNESS OF BREATH 12/01/14   Historical  Provider, MD  albuterol (PROVENTIL HFA;VENTOLIN HFA) 108 (90 Base) MCG/ACT inhaler Inhale 2 puffs into the lungs every 6 (six) hours as needed for wheezing or shortness of breath. 06/04/15   Gildardo Cranker, DO  amiodarone (PACERONE) 200 MG tablet Take 1 tablet (200 mg total) by mouth daily. 07/11/15   Thurnell Lose, MD  amLODipine (NORVASC) 10 MG tablet TAKE 1 TABLET BY MOUTH DAILY FOR HYPERTENSION 03/15/15   Gildardo Cranker, DO  apixaban (ELIQUIS) 2.5 MG TABS tablet Take 2.5 mg by mouth 2 (two) times daily.    Historical Provider, MD  atorvastatin (LIPITOR) 10 MG tablet Take 10 mg by mouth daily.    Historical Provider, MD  b complex vitamins tablet Take 1 tablet by mouth daily.     Historical Provider, MD  calcium-vitamin D (OSCAL WITH D) 500-200 MG-UNIT per tablet Take 1 tablet by mouth daily with breakfast.     Historical Provider, MD  chlorhexidine (PERIDEX) 0.12 % solution Use as directed 15 mLs in the mouth or throat 2 (two) times daily.    Historical Provider, MD  [employer] GAS RELIEF 80 MG chewable tablet CHEW 1 TABLET BY MOUTH 4 TIMES A DAY AS NEEDED FOR FLATULENCE 06/28/15   Gildardo Cranker, DO  diclofenac sodium (VOLTAREN) 1 % GEL Apply 2 g topically daily as needed (for pain).     Historical Provider, MD  feeding supplement (BOOST / RESOURCE BREEZE) LIQD Take 1 Container by mouth 3 (three) times daily between meals. 01/27/15   Shanker Kristeen Mans, MD  fluticasone (FLONASE) 50 MCG/ACT nasal spray Place 2 sprays into both nostrils daily. 08/07/14   Gildardo Cranker, DO  furosemide (LASIX) 20 MG tablet Take 1 tablet (20 mg total) by mouth daily. 07/23/15   Almyra Deforest, PA  gabapentin (NEURONTIN) 300 MG capsule TAKE 1 CAPSULE (300 MG TOTAL) BY MOUTH 3 (THREE) TIMES DAILY. 05/31/15   Gildardo Cranker, DO  hydrALAZINE (APRESOLINE) 100 MG tablet TAKE 1 TABLET (100 MG TOTAL) BY MOUTH 3 (THREE) TIMES DAILY. 05/31/15   Gildardo Cranker, DO  isosorbide dinitrate (ISORDIL) 10 MG tablet Take 10 mg by mouth 3 (three) times daily. If  chest pains occur increase to two tablets daily    Historical Provider, MD  isosorbide mononitrate (IMDUR) 30 MG 24 hr tablet TAKE 1 TABLET (30 MG TOTAL) BY MOUTH DAILY. 06/22/15   Historical Provider, MD  lactulose (CHRONULAC) 10 GM/15ML solution Take 15 mLs (10 g total) by mouth daily as needed for mild constipation. 06/02/14   Mahima Bubba Camp, MD  latanoprost (XALATAN) 0.005 % ophthalmic solution Place 1 drop into both eyes at bedtime. 06/02/14   Blanchie Serve, MD  levothyroxine (SYNTHROID, LEVOTHROID) 25 MCG tablet Take 1 tablet (25 mcg total) by mouth daily before breakfast. 07/21/15   Gildardo Cranker, DO  metoprolol succinate (TOPROL-XL) 100 MG 24 hr tablet Take 1 tablet (100 mg total)  by mouth daily. 07/11/15   Thurnell Lose, MD  Multiple Vitamins-Minerals (MULTIVITAMIN WITH MINERALS) tablet Take 1 tablet by mouth every morning.     Historical Provider, MD  pantoprazole (PROTONIX) 40 MG tablet TAKE 1 TABLET BY MOUTH TWICE A DAY 08/07/14   Gildardo Cranker, DO  Polyethyl Glycol-Propyl Glycol 0.4-0.3 % SOLN Place 1 drop into both eyes 4 (four) times daily.     Historical Provider, MD  polyethylene glycol (MIRALAX / GLYCOLAX) packet Take 17 g by mouth daily as needed for moderate constipation.    Historical Provider, MD  RESTASIS 0.05 % ophthalmic emulsion USE 1 DROP INTO BOTH EYES TWICE DAILY 10/19/14   Gildardo Cranker, DO  risperiDONE (RISPERDAL) 1 MG tablet Take 1 mg by mouth daily.  07/11/15   Historical Provider, MD  sennosides-docusate sodium (SENOKOT-S) 8.6-50 MG tablet Take 1 tablet by mouth 2 (two) times daily.     Historical Provider, MD  sertraline (ZOLOFT) 100 MG tablet TAKE 1 TABLET BY MOUTH ONCE DAILY FOR DEPRESSION 07/05/15   Gildardo Cranker, DO  sodium chloride 1 g tablet TAKE 1 TABLET BY MOUTH THREE TIMES DAILY 05/19/15   Gildardo Cranker, DO  traMADol (ULTRAM) 50 MG tablet Take 50 mg by mouth 2 (two) times daily as needed for moderate pain.     Historical Provider, MD   BP 99/50 mmHg  Pulse 32   Temp(Src) 97.7 F (36.5 C) (Oral)  Resp 15  Ht 5\' 6"  (1.676 m)  Wt 145 lb (65.772 kg)  BMI 23.41 kg/m2  SpO2 97% Physical Exam  Constitutional: She is oriented to person, place, and time. She appears well-developed. She appears distressed (Somewhat uncomfortable).  Frail, elderly  HENT:  Head: Normocephalic and atraumatic.  Right Ear: External ear normal.  Left Ear: External ear normal.  Eyes: Conjunctivae and EOM are normal. Pupils are equal, round, and reactive to light.  Neck: Normal range of motion and phonation normal. Neck supple.  Cardiovascular: Regular rhythm and normal heart sounds.   Bradycardic  Pulmonary/Chest: Effort normal and breath sounds normal. She exhibits no bony tenderness.  Abdominal: Soft. There is no tenderness.  Musculoskeletal: Normal range of motion. She exhibits edema (2 plus lower legs bilaterally).  Neurological: She is alert and oriented to person, place, and time. No cranial nerve deficit or sensory deficit. She exhibits normal muscle tone. Coordination normal.  Skin: Skin is warm, dry and intact.  Psychiatric: She has a normal mood and affect. Her behavior is normal.  Nursing note and vitals reviewed.   ED Course  Procedures (including critical care time)  Medications  acetaminophen (TYLENOL) tablet 325 mg (not administered)  mometasone-formoterol (DULERA) 200-5 MCG/ACT inhaler 2 puff (not administered)  albuterol (PROVENTIL HFA;VENTOLIN HFA) 108 (90 Base) MCG/ACT inhaler 2 puff (not administered)  amLODipine (NORVASC) tablet 10 mg (not administered)  apixaban (ELIQUIS) tablet 2.5 mg (not administered)  atorvastatin (LIPITOR) tablet 10 mg (not administered)  calcium-vitamin D (OSCAL WITH D) 500-200 MG-UNIT per tablet 1 tablet (not administered)  diclofenac sodium (VOLTAREN) 1 % transdermal gel 2 g (not administered)  feeding supplement (BOOST / RESOURCE BREEZE) liquid 1 Container (not administered)  fluticasone (FLONASE) 50 MCG/ACT nasal spray  2 spray (not administered)  furosemide (LASIX) tablet 20 mg (not administered)  gabapentin (NEURONTIN) capsule 300 mg (not administered)  hydrALAZINE (APRESOLINE) tablet 100 mg (not administered)  isosorbide dinitrate (ISORDIL) tablet 10 mg (not administered)  lactulose (CHRONULAC) 10 GM/15ML solution 10 g (not administered)  latanoprost (XALATAN) 0.005 % ophthalmic  solution 1 drop (not administered)  levothyroxine (SYNTHROID, LEVOTHROID) tablet 25 mcg (not administered)  multivitamin with minerals tablet 1 tablet (not administered)  pantoprazole (PROTONIX) EC tablet 40 mg (not administered)  Polyethyl Glycol-Propyl Glycol 0.4-0.3 % SOLN 1 drop (not administered)  polyethylene glycol (MIRALAX / GLYCOLAX) packet 17 g (not administered)  cycloSPORINE (RESTASIS) 0.05 % ophthalmic emulsion 1 drop (not administered)  risperiDONE (RISPERDAL) tablet 1 mg (not administered)  sennosides-docusate sodium (SENOKOT-S) 8.6-50 MG tablet 1 tablet (not administered)  sertraline (ZOLOFT) tablet 100 mg (not administered)  traMADol (ULTRAM) tablet 50 mg (not administered)  aspirin chewable tablet 324 mg (not administered)    Or  aspirin suppository 300 mg (not administered)  aspirin EC tablet 81 mg (not administered)  nitroGLYCERIN (NITROSTAT) SL tablet 0.4 mg (not administered)  ondansetron (ZOFRAN) injection 4 mg (not administered)  sodium chloride flush (NS) 0.9 % injection 3 mL (not administered)  sodium chloride flush (NS) 0.9 % injection 3 mL (not administered)  0.9 %  sodium chloride infusion (not administered)    Patient Vitals for the past 24 hrs:  BP Temp Temp src Pulse Resp SpO2 Height Weight  08/02/15 1548 (!) 99/50 mmHg 97.7 F (36.5 C) Oral (!) 32 15 97 % 5\' 6"  (1.676 m) 145 lb (65.772 kg)    4:04 PM Reevaluation with update and discussion. After initial assessment and treatment, an updated evaluation reveals No change in status. Admission orders have been entered by cardiology service..  [place]  CBC WITH DIFFERENTIAL/PLATELET  I-STAT CHEM 8, ED  I-STAT TROPOININ, ED    Imaging Review No results found. I have personally reviewed and evaluated these images and lab results as part of my medical decision-making.   EKG Interpretation   Date/Time:  Monday Aug 02 2015 15:47:50 EDT Ventricular Rate:  33 PR Interval:    QRS Duration: 132 QT Interval:  551 QTC Calculation: 408 R Axis:   99 Text Interpretation:  Junctional rhythm Nonspecific intraventricular  conduction delay Anteroseptal infarct, age indeterminate Since last  tracing rate slower Confirmed by Eulis Foster  MD, Krystalynn Ridgeway [phone]) on 08/02/2015  3:53:04 PM Also confirmed by Eulis Foster  MD, Marylee Belzer [phone]), editor [place],  Leda Gauze [phone])  on 08/02/2015 3:57:11 PM      MDM   Final diagnoses:  Bradycardia    Bradycardia, likely multifactorial related to medications. She will require admission, monitoring for hemodynamic stability. Doubt ACS, PE or pneumonia  Nursing Notes Reviewed/ Care Coordinated, and agree without changes. Applicable Imaging Reviewed.  Interpretation of Laboratory Data incorporated into ED treatment  Plan: Admit to cardiology  Daleen Bo, MD 08/02/15 [phone]

## 2015-08-03 ENCOUNTER — Encounter (HOSPITAL_COMMUNITY): Payer: Self-pay | Admitting: General Practice

## 2015-08-03 LAB — BASIC METABOLIC PANEL
Anion gap: 14 (ref 5–15)
Anion gap: 10 (ref 5–15)
BUN: 29 mg/dL — ABNORMAL HIGH (ref 6–20)
BUN: 30 mg/dL — ABNORMAL HIGH (ref 6–20)
CO2: 17 mmol/L — ABNORMAL LOW (ref 22–32)
CO2: 21 mmol/L — ABNORMAL LOW (ref 22–32)
Calcium: 9.5 mg/dL (ref 8.9–10.3)
Calcium: 9.3 mg/dL (ref 8.9–10.3)
Chloride: 101 mmol/L (ref 101–111)
Chloride: 97 mmol/L — ABNORMAL LOW (ref 101–111)
Creatinine, Ser: 2.12 mg/dL — ABNORMAL HIGH (ref 0.44–1.00)
Creatinine, Ser: 2.2 mg/dL — ABNORMAL HIGH (ref 0.44–1.00)
GFR calc Af Amer: 22 mL/min — ABNORMAL LOW (ref >60)
GFR calc Af Amer: 21 mL/min — ABNORMAL LOW (ref >60)
GFR calc non Af Amer: 19 mL/min — ABNORMAL LOW (ref >60)
GFR calc non Af Amer: 19 mL/min — ABNORMAL LOW (ref >60)
Glucose, Bld: 168 mg/dL — ABNORMAL HIGH (ref 65–99)
Glucose, Bld: 162 mg/dL — ABNORMAL HIGH (ref 65–99)
Potassium: 5.9 mmol/L — ABNORMAL HIGH (ref 3.5–5.1)
Potassium: 5 mmol/L (ref 3.5–5.1)
Sodium: 132 mmol/L — ABNORMAL LOW (ref 135–145)
Sodium: 128 mmol/L — ABNORMAL LOW (ref 135–145)

## 2015-08-03 LAB — MRSA PCR SCREENING: MRSA by PCR: NEGATIVE

## 2015-08-03 MED ORDER — LEVOTHYROXINE SODIUM 50 MCG PO TABS
50.0000 ug | ORAL_TABLET | Freq: Every day | ORAL | Status: DC
  Administered 2015-08-04: 50 ug via ORAL
  Filled 2015-08-03: qty 1

## 2015-08-03 MED ORDER — AMLODIPINE BESYLATE 5 MG PO TABS
5.0000 mg | ORAL_TABLET | Freq: Every day | ORAL | Status: DC
  Administered 2015-08-04: 5 mg via ORAL
  Filled 2015-08-03 (×2): qty 1

## 2015-08-03 MED ORDER — BOOST / RESOURCE BREEZE PO LIQD
1.0000 | Freq: Two times a day (BID) | ORAL | Status: DC
  Administered 2015-08-04 – 2015-08-05 (×3): 1 via ORAL

## 2015-08-03 NOTE — Progress Notes (Signed)
Initial Nutrition Assessment   INTERVENTION:  Continue Boost Breeze po BID, each supplement provides 250 kcal and 9 grams of protein  Encourage PO intake  NUTRITION DIAGNOSIS:   Inadequate oral intake related to poor appetite, other (see comment) (and early satiety) as evidenced by per patient/family report.   GOAL:   Patient will meet greater than or equal to 90% of their needs   MONITOR:   PO intake, Labs, Weight trends, Skin, I & O's  REASON FOR ASSESSMENT:   Malnutrition Screening Tool    ASSESSMENT:   82 year old female With past medical history of hypertension, diabetes mellitus, COPD And recent atrial flutter for evaluation of bradycardia and chest pain. She presented today with complaints of chest pain and episodes of weakness.   Pt was eating a late breakfast at time of visit. She feels that she is eating fairly well; states that she doesn't have much of an appetite and gets full quickly, but she continues to eat at every meal. Encouraged pt to drink supplements between meals instead of before or during. She reports drinking Boost Breeze or regular Boost twice daily at home. She reports having some nausea last night. She is unsure if she has lost weight due to fluid weight. Per weight history, pt lost down to 126 lbs one month ago. Unable to complete physical exam at this time.  Pt reports difficulty finding nutritional supplements locally; RD provided information for [facility]. She reports eating pureed foods PTA. Per nursing notes, pt is eating 50% of meals.   Labs: low sodium, glucose ranging 123 to 168 mg/dL, low hemoglobin  Diet Order:  DIET - DYS 1 Room service appropriate?: Yes; Fluid consistency:: Thin  Skin:  Reviewed, no issues  Last BM:  5/1  Height:   Ht Readings from Last 1 Encounters:  08/02/15 5\' 6"  (1.676 m)    Weight:   Wt Readings from Last 1 Encounters:  08/03/15 144 lb 9.6 oz (65.59 kg)    Ideal Body Weight:   59.1 kg  BMI:  Body mass index is 23.35 kg/(m^2).  Estimated Nutritional Needs:   Kcal:  1450-1650  Protein:  75-90 grams  Fluid:  >/=1.6 L/day  EDUCATION NEEDS:   No education needs identified at this time  [place], LDN Inpatient Clinical Dietitian Pager: [phone] After Hours Pager: [phone]

## 2015-08-03 NOTE — Progress Notes (Signed)
Bladder scan done , urine amount less than 135, pt is not feeling any urge to urinate, will continue to monitor.

## 2015-08-03 NOTE — Progress Notes (Signed)
Pt c/o not being able to void.  Bladder scan shows 447.  MD notified and ordered to in and out cath.  375 out.  Will continue to monitor.

## 2015-08-03 NOTE — Progress Notes (Signed)
Subjective:  Shannon [name] is a 82 yo F with PMHx of HTN, h/o aflutter, CKD III, chronic diastolic CHF, [id number], CVD, and carotid stenosis who presented to the ED on 5/1 from the outpatient cardiology office for symptoms of dizziness and bradycardia in the 30s. Bradycardia was felt secondary to her medications (Toprol and Amiodarone) which were held on admission. Patient was started on IV dopamine and admitted to telemetry.   Patient was seen and examined this morning. She feels her lightheadedness has improved from yesterday, but she continues to have some. She denies chest pain or pressure- she feels she has gas. She did vomit once yesterday, but denies current nausea. She feels her shortness of breath has improved from yesterday, but does feel more swollen than normal especially in her lower extremities.    Objective:  Filed Vitals:   08/03/15 0128 08/03/15 0221 08/03/15 0629 08/03/15 0904  BP: 104/55 106/51 90/42 115/53  Pulse: 47 45 43 45  Temp:  97.2 F (36.2 C) 97.7 F (36.5 C) 97.7 F (36.5 C)  TempSrc:  Oral Oral Oral  Resp:  16 15 16   Height:      Weight:   144 lb 9.6 oz (65.59 kg)   SpO2:  96% 93% 95%    Intake/Output from previous day:  Intake/Output Summary (Last 24 hours) at 08/03/15 1037 Last data filed at 08/03/15 0700  Gross per 24 hour  Intake 199.63 ml  Output    931 ml  Net -731.37 ml    Physical Exam: General: Vital signs reviewed.  Patient is elderly, in no acute distress and cooperative with exam.  Cardiovascular: Bradycardic, regular rhythm, S1 normal, S2 normal, no murmurs, gallops, or rubs. Pulmonary/Chest: Clear to auscultation bilaterally, no wheezes, rales, or rhonchi. Abdominal: Soft, non-tender, non-distended, BS + Extremities: 2+ pitting edema to knees bilaterally. Neurological: Awake and alert, appropriate Skin: Warm, dry and intact. Psychiatric: Speech and behavior is normal. Cognition and memory are grossly normal.    Lab Results: Basic  Metabolic Panel:  Recent Labs  08/02/15 1638 08/03/15 0242  NA 129*  131* 132*  K 5.2*  5.2* 5.9*  CL 99*  98* 101  CO2 20* 17*  GLUCOSE 157*  148* 168*  BUN 26*  27* 29*  CREATININE 1.95*  1.90* 2.12*  CALCIUM 9.2 9.5  MG 1.8  --    CBC:  Recent Labs  08/02/15 1638  WBC 4.9  NEUTROABS 2.8  HGB 9.6*  10.9*  HCT 28.3*  32.0*  MCV 91.3  PLT 363   Assessment/Plan:   Symptomatic Bradycardia: Hemodynamically stable, although blood pressure have been soft. HR has improved slightly from the 30s yesterday to 43-45 this morning. EKG with junctional bradycardia. We will continue to monitor for improvement. If HR does not improve off of the medications, will need to consider pacemaker placement. -Continue Dopamine 6.2 mL/hr IV -Discontinue Toprol -Holding amiodarone for now -Telemetry -Consider Pacemaker placement if HR does not improve off medications  H/o Atrial Flutter: On Eliquis, metoprolol, amiodarone at home. Toprol and amiodarone are now being help while HR improves. Patient is currently in junctional bradycardia. If HR does improve, would consider restarting low-dose amiodarone.  -Continue Eliquis 2.5 mg BID -Holding amiodarone for now  Acute on Chronic Diastolic CHF: Likely secondary to bradycardia. Patient is on amlodipine 10 mg daily, lasix 20 mg daily, Imdur 30 mg daily, Toprol 100 mg daily, and hydralazine 100 mg TID at home. Patient was given Lasix 40 mg IV  once yesterday. Weight 147>144 this morning with net negative -731 mL yesterday. Lungs are CTA b/l, but patient has 2+ pitting edema bilaterally in lower extremities. Given kidney function, will hold diuresis for now and let her perfuse. -Holding Lasix -Increase Synthroid as below  Hyperkalemia: K 5.2 yesterday prior to Lasix 40 mg IV. This morning K was 5.9; however, specimen was hemolyzed. -Repeat BMET pending  HTN: Now with borderline hypotension. BP 90/42 overnight and 115/43 this am. This could be  contributing to lightheadedness as well.  -Decrease amlodipine to 5 mg daily  -Hold hydralazine 100 mg TID  Acute on Chronic CKD: Creatinine 2.12 this morning and 1.9 on admission, possibly secondary to decreased perfusion from bradycardia. Baseline appears to be 1.5. Caution with diuresis. -Repeat BMET tomorrow am -Holding diuresis  Hyponatremia: Sodium 131>129>132 this morning. Patient has history of SIADH, previously took salt tabs for this. -Repeat BMET tomorrow am -Consider further work up if not improving  Hypothyroidism: Patient was previously on Levothyroxine 200 mcg daily which lead to iatrogenic hyperthyroidism. Levothyroxine was held at the begging of  Levothyroxine 25 mcg daily. TSH elevated at 83. Hypothyroidism possibly contributing to bradycardia. -Increase Synthroid to 50 mcg daily  Chronic Pain: On tramadol, Voltaren gel. -Continue current medications  H/o of Seizure: On gabapentin. -Continue home medication  H/o of CVA: -Continue ASA 81 mg daily  CODE STATUS: Partial- no CPR, defibrillation, cardioversion or intubation- meds only. DVT/PE ppx: Eliquis 2.5 mg BID FEN: Dysphagia 1 diet  Martyn Malay, DO PGY-2 Internal Medicine Resident Pager # [phone] 08/03/2015 10:37 AM  As above, patient seen and examined. Her strength has improved today. She denies dyspnea. She had mild chest pain earlier. She remains mildly volume overloaded on examination. Telemetry reviewed and shows junctional rhythm with heart rate in the low 40s. Some improvement compared to yesterday. Creatinine Increased to 2.12. Potassium 5.9 but hemolyzed. 1 bradycardia-continue to hold Amiodarone and Toprol. We would like to avoid pacemaker if possible.If heart rate does improve we will resume low-dose amiodarone to help maintain sinus rhythm. Continue apixaban. Note her TSH is markedly elevated and hypothyroidism could be contributing to bradycardia. We will increase Synthroid dose as well. 2 acute  diastolic congestive heart failure-she remains volume overloaded. However her renal function is worse.We will hold further diuresis for now. Hopefully some of her congestive heart failure with resolution of bradycardia and improved cardiac output. 3 hypertension-blood pressure is soft. Hold hydralazine/nitrates and decrease amlodipine to 5 mg daily. 4 hypothyroidism-markedly elevated TSH-increase Synthroid to 50 g daily. 5 Acute on chronic stage III kidney disease-hold diuresis for now. 6 hyperkalemia-specimen hemolyzed. Await follow-up. Kirk Ruths

## 2015-08-04 LAB — BASIC METABOLIC PANEL
Anion gap: 9 (ref 5–15)
BUN: 36 mg/dL — ABNORMAL HIGH (ref 6–20)
CO2: 21 mmol/L — ABNORMAL LOW (ref 22–32)
Calcium: 9.2 mg/dL (ref 8.9–10.3)
Chloride: 98 mmol/L — ABNORMAL LOW (ref 101–111)
Creatinine, Ser: 2.35 mg/dL — ABNORMAL HIGH (ref 0.44–1.00)
GFR calc Af Amer: 20 mL/min — ABNORMAL LOW (ref >60)
GFR calc non Af Amer: 17 mL/min — ABNORMAL LOW (ref >60)
Glucose, Bld: 171 mg/dL — ABNORMAL HIGH (ref 65–99)
Potassium: 5 mmol/L (ref 3.5–5.1)
Sodium: 128 mmol/L — ABNORMAL LOW (ref 135–145)

## 2015-08-04 MED ORDER — POLYETHYLENE GLYCOL 3350 17 G PO PACK
17.0000 g | PACK | Freq: Two times a day (BID) | ORAL | Status: DC
  Administered 2015-08-04: 17 g via ORAL
  Filled 2015-08-04 (×3): qty 1

## 2015-08-04 NOTE — Progress Notes (Signed)
Subjective:  Shannon [name] is a 71 F with PMHx of HTN, h/o aflutter, CKD III, chronic diastolic CHF, 123456, CVD, and carotid stenosis who is admitted with bradycardia.   Patient was seen and examined this morning. She feels "better than yesterday and the day before yesterday." She denies any chest pain, shortness of breath, lightheadedness, nausea or vomiting. She does admit to constipation (no BM since Sunday) and states she takes Miralax BID at home for constipation.   Objective:  Filed Vitals:   08/03/15 2042 08/04/15 0511 08/04/15 0847 08/04/15 0950  BP:  122/58  142/53  Pulse:  52  50  Temp:  98.6 F (37 C)    TempSrc:  Oral    Resp:  18  18  Height:      Weight:  151 lb (68.493 kg)    SpO2: 91% 91% 90% 95%   Intake/Output from previous day:  Intake/Output Summary (Last 24 hours) at 08/04/15 1020 Last data filed at 08/04/15 0808  Gross per 24 hour  Intake  397.2 ml  Output   1225 ml  Net -827.8 ml   Physical Exam: General: Vital signs reviewed.  Patient is elderly, in no acute distress and cooperative with exam.  Neck: No JVD or carotid bruit present.  Cardiovascular: Bradycardic, regular rhythm, S1 normal, S2 normal, no murmurs, gallops, or rubs. Pulmonary/Chest: Clear to auscultation bilaterally, no wheezes, rales, or rhonchi. Abdominal: Soft, non-tender, mildly distended, BS + Extremities: 2+ pitting edema to knees bilaterally. Skin: Warm, dry and intact.  Psychiatric: Normal mood and affect. speech and behavior is normal. Cognition and memory are grossly normal.   Lab Results: Basic Metabolic Panel:  Recent Labs  08/02/15 1638  08/03/15 1000 08/04/15 0331  NA 129*  131*  < > 128* 128*  K 5.2*  5.2*  < > 5.0 5.0  CL 99*  98*  < > 97* 98*  CO2 20*  < > 21* 21*  GLUCOSE 157*  148*  < > 162* 171*  BUN 26*  27*  < > 30* 36*  CREATININE 1.95*  1.90*  < > 2.20* 2.35*  CALCIUM 9.2  < > 9.3 9.2  MG 1.8  --   --   --   < > = values in this interval not  displayed. CBC:  Recent Labs  08/02/15 1638  WBC 4.9  NEUTROABS 2.8  HGB 9.6*  10.9*  HCT 28.3*  32.0*  MCV 91.3  PLT 363   Assessment/Plan:  Shannon [name] is a 31 F with PMHx of HTN, h/o aflutter, CKD III, chronic diastolic CHF, 123456, CVD, and carotid stenosis who is admitted with bradycardia.   Symptomatic Bradycardia: Hemodynamically stable. HR improving to the 50s (previously in 30s). We will continue to monitor for improvement, holding amiodarone and Toprol. We are hoping to avoid pacemaker placement. Patient's hypothyroidism (TSH 80s) may have been contributing as well. -D/C Dopamine 6.2 mL/hr IV -Holding amiodarone for now -Telemetry  H/o Atrial Flutter: Patient is currently in junctional bradycardia. Will consider restarting low dose amiodarone if HR improves given history of atrial flutter with RVR. -Continue Eliquis 2.5 mg BID -Holding amiodarone for now  Chronic Diastolic CHF: Patient appears euvolemic-no shortness of breath lungs are clear; however, there is evidence of 2+ LEE b/l which is more likely due to venous stasis. Weight 147>144>151 this morning with net negative -1209 mL yesterday. Given worsening kidney function, we are holding diuresis for now with hopes that her kidney function improves  as her heart rate and BP improve. -Holding Lasix -Increase Synthroid as below  Hyperkalemia: Resolved. K 5.0 this morning. -Repeat BMET tomorrow am  HTN: Hypotension improved after d/c hydralazine and decreasing amlodipine. 120-140s/50s this morning.  -Continue amlodipine 5 mg daily   Acute on Chronic CKD: Creatinine 1.9>2.12>2.35 this morning. Possibly secondary to decreased perfusion from bradycardia. Baseline appears to be 1.5. Holding diuresis.  -Repeat BMET tomorrow am -Holding diuresis  Hyponatremia: Sodium 131>132>128 morning. Patient has history of SIADH, previously took salt tabs for this. -Repeat BMET tomorrow am -Consider further work up if not  improving  Hypothyroidism: TSH elevated at 87. Hypothyroidism possibly contributing to bradycardia. Increased Synthroid from 25 mcg to 50 mcg daily.  -Continue Synthroid to 50 mcg daily  Constipation: Patient reports no BM since Sunday and states she normally takes Miralax BID at home as prescribed by her PCP. -Resume home Miralax BID  CODE STATUS: Partial- no CPR, defibrillation, cardioversion or intubation- meds only. DVT/PE ppx: Eliquis 2.5 mg BID FEN: Dysphagia 1 diet  Martyn Malay, DO PGY-2 Internal Medicine Resident Pager # [phone] 08/04/2015 10:20 AM  As above, patient seen and examined. She continues to have an occasional pain in her chest. She denies dyspnea and her energy is better. On exam she has 1+ lower extremity edema. Review of telemetry shows she is now in sinus rhythm with heart rate in the 50s. Junctional bradycardia has resolved. Plan to continue to hold metoprolol and amiodarone. I will consider resuming low-dose amiodarone at 100 mg daily tomorrow if heart rate continues to improve. We will continue off of beta blockade long-term. Continue apixaban for history of atrial flutter. Discontinue dopamine. Creatinine mildly increased compared to yesterday. This is possibly secondary to poor perfusion related to her bradycardia arrhythmia and overdiuresis.Holding Lasix for now. Follow sodium. Kirk Ruths

## 2015-08-05 LAB — BASIC METABOLIC PANEL
Anion gap: 11 (ref 5–15)
BUN: 30 mg/dL — ABNORMAL HIGH (ref 6–20)
CO2: 21 mmol/L — ABNORMAL LOW (ref 22–32)
Calcium: 9 mg/dL (ref 8.9–10.3)
Chloride: 100 mmol/L — ABNORMAL LOW (ref 101–111)
Creatinine, Ser: 1.63 mg/dL — ABNORMAL HIGH (ref 0.44–1.00)
GFR calc Af Amer: 31 mL/min — ABNORMAL LOW (ref >60)
GFR calc non Af Amer: 27 mL/min — ABNORMAL LOW (ref >60)
Glucose, Bld: 131 mg/dL — ABNORMAL HIGH (ref 65–99)
Potassium: 4.1 mmol/L (ref 3.5–5.1)
Sodium: 132 mmol/L — ABNORMAL LOW (ref 135–145)

## 2015-08-05 MED ORDER — LEVOTHYROXINE SODIUM 50 MCG PO TABS: 50.0000 ug | ORAL_TABLET | Freq: Every day | ORAL | Status: DC

## 2015-08-05 MED ORDER — AMIODARONE HCL 100 MG PO TABS
100.0000 mg | ORAL_TABLET | Freq: Every day | ORAL | Status: DC
Start: 2015-08-05 — End: 2015-11-05

## 2015-08-05 MED ORDER — ISOSORBIDE MONONITRATE ER 30 MG PO TB24
30.0000 mg | ORAL_TABLET | Freq: Every day | ORAL | Status: DC
  Administered 2015-08-05: 30 mg via ORAL
  Filled 2015-08-05: qty 1

## 2015-08-05 MED ORDER — HYDRALAZINE HCL 25 MG PO TABS
25.0000 mg | ORAL_TABLET | Freq: Three times a day (TID) | ORAL | Status: DC
  Administered 2015-08-05 (×2): 25 mg via ORAL
  Filled 2015-08-05 (×2): qty 1

## 2015-08-05 MED ORDER — HYDRALAZINE HCL 25 MG PO TABS: 25.0000 mg | ORAL_TABLET | Freq: Three times a day (TID) | ORAL | Status: DC

## 2015-08-05 MED ORDER — LEVOTHYROXINE SODIUM 50 MCG PO TABS
50.0000 ug | ORAL_TABLET | Freq: Every day | ORAL | Status: DC
  Administered 2015-08-05: 50 ug via ORAL
  Filled 2015-08-05: qty 1

## 2015-08-05 MED ORDER — AMLODIPINE BESYLATE 10 MG PO TABS
10.0000 mg | ORAL_TABLET | Freq: Every day | ORAL | Status: DC
  Administered 2015-08-05: 10 mg via ORAL

## 2015-08-05 MED ORDER — AMIODARONE HCL 100 MG PO TABS
100.0000 mg | ORAL_TABLET | Freq: Every day | ORAL | Status: DC
  Administered 2015-08-05: 100 mg via ORAL
  Filled 2015-08-05: qty 1

## 2015-08-05 NOTE — Discharge Summary (Signed)
Discharge Summary    Patient ID: Shannon [name],  MRN: [id number], DOB/AGE: Aug 23, 1923 82 y.o.  Admit date: 08/02/2015 Discharge date: 08/05/2015  Primary Care Provider: Kirt Boys Primary Cardiologist: Dr. Delton See  Discharge Diagnoses    Principal Problem:   Bradycardia Active Problems:   Hypothyroidism   Depression   Accelerated hypertension   Hyponatremia   Hyperlipidemia   SIADH (syndrome of inappropriate ADH production) (HCC)   Acute renal failure superimposed on stage 3 chronic kidney disease (HCC)  Allergies No Known Allergies  Diagnostic Studies/Procedures    None _____________   History of Present Illness     Shannon [name] is a 82 yo F with PMHx of HTN, h/o aflutter, CKD III, chronic diastolic CHF, T2DM, CVD, and carotid stenosis who presented to the ED on 5/1 from the outpatient cardiology office for symptoms of dizziness and bradycardia in the 30s. Bradycardia was felt secondary to her medications (Toprol and Amiodarone) which were held on admission. Patient was started on IV dopamine and admitted to telemetry.   Hospital Course     Symptomatic Bradycardia: While on dopamine IV and while holding metoprolol and amiodarone, patient's HR improved from the 30s to the 50s. She converted from junctional bradycardia to sinus bradycardia. Her symptoms of lightheadedness resolved. Given her history of atrial flutter with RVR, we restarted her amiodarone at a lower dose of 100 mg daily. Metoprolol was discontinued. Please follow up HR on follow up.   H/o Atrial Flutter: Patient converted from junctional bradycardia to sinus bradycardia, HR 50s. Given improvement in HR, we restarted her amiodarone at a lower dose of 100 mg daily. We continued her Eliquis 2.5 mg BID.  Chronic Diastolic CHF: Patient presented with mild volume overload likely due to bradycardia. She was started on IV Lasix which was later discontinued due to worsening renal function. On day of discharge, patient  denies shortness of breath. She appeared euvolemic with weight stable at 149. Net negative since admission. Patient will restart her lasix 20 mg daily tomorrow (08/06/15). Please check volume status and a BMET on follow up.   HTN: Patient was admitted on amlodipine 10 mg daily, hydralazine 100 mg TID, metoprolol, and Imdur 30 mg daily. These were held due to hypotension which improved as HR improved. We restarted the following regimen on discharge: amlodipine 10 mg daily, hydralazine 25 mg TID, and Imdur 30 mg daily. Metoprolol was discontinued. Please assess BP control.   Acute on Chronic CKD, resolved: Creatinine improved from 2.35>1.63 (patient's baseline) after holding diuresis. Acute on chronic CKD was likely due to over-diuresis and poor perfusion of kidneys due to bradycardia and hypotension. Please follow up BMET in one week.   Hyponatremia: Stable on discharge at 132. Patient has history of SIADH, previously took salt tabs for this. Repeat BMET in one week.   Hypothyroidism: TSH elevated at 83 on admission. Patient originally had iatrogenic hyperthryoidism while on Synthroid 200 mcg daily back in March. At that time, Synthroid was discontinued completely. At follow up with PCP, Synthroid was restarted at 25 mcg daily. Hypothyroidism possibly contributing to bradycardia.We increased Synthroid from 25 mcg to 50 mcg daily. May need further titration as outpatient. Repeat TSH in 6 weeks (June 13th).  Constipation: Continued home Miralax BID _____________  Discharge Vitals Blood pressure 182/61, pulse 57, temperature 98.6 F (37 C), temperature source Oral, resp. rate 16, height 5\' 6"  (1.676 m), weight 149 lb 1.6 oz (67.631 kg), SpO2 92 %.  Filed Weights  08/03/15 0629 08/04/15 0511 08/05/15 0503  Weight: 144 lb 9.6 oz (65.59 kg) 151 lb (68.493 kg) 149 lb 1.6 oz (67.631 kg)    Labs & Radiologic Studies     CBC  Recent Labs  08/02/15 1638  WBC 4.9  NEUTROABS 2.8  HGB 9.6*  10.9*    HCT 28.3*  32.0*  MCV 91.3  PLT 363   Basic Metabolic Panel  Recent Labs  08/02/15 1638  08/04/15 0331 08/05/15 0353  NA 129*  131*  < > 128* 132*  K 5.2*  5.2*  < > 5.0 4.1  CL 99*  98*  < > 98* 100*  CO2 20*  < > 21* 21*  GLUCOSE 157*  148*  < > 171* 131*  BUN 26*  27*  < > 36* 30*  CREATININE 1.95*  1.90*  < > 2.35* 1.63*  CALCIUM 9.2  < > 9.2 9.0  MG 1.8  --   --   --   < > = values in this interval not displayed.  Thyroid Function Tests  Recent Labs  08/02/15 1638  TSH 83.858*    Ct Chest Wo Contrast  07/07/2015  CLINICAL DATA:  Shortness of breath the last few days. Unable to have contrast due to renal function Hx: HTN, Stroke, COPD, Diabetes EXAM: CT CHEST WITHOUT CONTRAST TECHNIQUE: Multidetector CT imaging of the chest was performed following the standard protocol without IV contrast. COMPARISON:  CT abdomen 01/10/2015 and earlier studies FINDINGS: Mediastinum/Lymph Nodes: Coarse coronary and aortic calcifications. No hilar or mediastinal adenopathy, sensitivity decreased without IV contrast. No pericardial effusion. Lungs/Pleura: Lungs clear.  No pleural effusion.  No pneumothorax. Upper abdomen: Cholecystectomy clips. Stable low-attenuation liver lesions since previous CT abdomen 05/21/2014 probably cysts. Musculoskeletal: T11 compression fracture deformity with at least 50% loss of height anteriorly, stable 05/21/2014. No retropulsion. No posterior element involvement. No new fracture. Sternum intact. IMPRESSION: 1. No acute abnormality. 2. Atherosclerosis, including aortic and coronary artery disease. Please note that although the presence of coronary artery calcium documents the presence of coronary artery disease, the severity of this disease and any potential stenosis cannot be assessed on this non-gated CT examination. Assessment for potential risk factor modification, dietary therapy or pharmacologic therapy may be warranted, if clinically indicated. 3. Old  L2-T11 compression deformity. Electronically Signed   By: Corlis Leak M.D.   On: 07/07/2015 13:07   Nm Pulmonary Perf And Vent  07/07/2015  CLINICAL DATA:  Short of breath EXAM: NUCLEAR MEDICINE VENTILATION - PERFUSION LUNG SCAN TECHNIQUE: Ventilation images were obtained in multiple projections using inhaled aerosol Tc-36m DTPA. Perfusion images were obtained in multiple projections after intravenous injection of Tc-54m MAA. RADIOPHARMACEUTICALS:  29.5 mCi Technetium-59m DTPA aerosol inhalation and 4.2 mCi Technetium-60m MAA IV COMPARISON:  Chest x-ray 07/06/2015 FINDINGS: Ventilation: No focal ventilation defect. Perfusion: No wedge shaped peripheral perfusion defects to suggest acute pulmonary embolism. IMPRESSION: Normal ventilation perfusion scan. Very low probability for pulmonary embolism. Electronically Signed   By: Marlan Palau M.D.   On: 07/07/2015 12:57   Dg Chest Port 1 View  08/02/2015  CLINICAL DATA:  Severe bradycardia.  Generalized weakness. EXAM: PORTABLE CHEST 1 VIEW COMPARISON:  07/08/2015 FINDINGS: Cardiomegaly with calcification in the thoracic aorta. Pulmonary vascularity is normal. Slight atelectasis at the left lung base. Right lung is clear. No acute bone abnormality. IMPRESSION: New cardiomegaly. Aortic atherosclerosis. Slight atelectasis at the left lung base. Electronically Signed   By: Francene Boyers M.D.   On:  08/02/2015 16:37   Dg Chest Port 1 View  07/08/2015  CLINICAL DATA:  Shortness of breath EXAM: PORTABLE CHEST 1 VIEW COMPARISON:  07/07/2015 FINDINGS: The heart size appears normal. There is aortic atherosclerosis. No pleural effusion or edema. No airspace consolidation identified. Osteoarthritis is noted involving both glenohumeral joints. IMPRESSION: 1. No acute cardiopulmonary abnormalities. Electronically Signed   By: Signa Kell M.D.   On: 07/08/2015 18:02    Disposition   Pt is being discharged home today in good condition.  Follow-up Plans & Appointments    Symptomatic Bradycardia: Please follow up HR on follow up.   Chronic Diastolic CHF: Please check volume status and a BMET on follow up.   HTN: Please assess BP control.   Acute on Chronic CKD, resolved: Please follow up BMET in one week.   Hyponatremia: Repeat BMET in one week.   Hypothyroidism: Repeat TSH in 6 weeks (June 13th).  Follow-up Information    Follow up with Kirt Boys, DO On 08/06/2015.   Specialty:  Internal Medicine   Why:  11 am for hospital follow up   Contact information:   [address] [place] [address] [phone]       Follow up with [facility], NP On 08/10/2015.   Specialties:  Cardiology, Radiology   Why:  11:30 am   Contact information:   [address] [place] [address] [phone]        Discharge Medications   Current Discharge Medication List    CONTINUE these medications which have CHANGED   Details  amiodarone (PACERONE) 100 MG tablet Take 1 tablet (100 mg total) by mouth daily. Qty: 30 tablet, Refills: 1    hydrALAZINE (APRESOLINE) 25 MG tablet Take 1 tablet (25 mg total) by mouth every 8 (eight) hours. Qty: 90 tablet, Refills: 1    levothyroxine (SYNTHROID, LEVOTHROID) 50 MCG tablet Take 1 tablet (50 mcg total) by mouth daily before breakfast. Qty: 30 tablet, Refills: 1   Associated Diagnoses: Hypothyroidism, unspecified hypothyroidism type      CONTINUE these medications which have NOT CHANGED   Details  acetaminophen (TYLENOL) 325 MG tablet Take 325 mg by mouth every 6 (six) hours as needed for mild pain or fever.     ADVAIR DISKUS 250-50 MCG/DOSE AEPB INHALE 1 PUFF INTO THE LUNGS 2 (TWO) TIMES DAILY AS NEEDED FOR SHORTNESS OF BREATH Refills: 3    albuterol (PROVENTIL HFA;VENTOLIN HFA) 108 (90 Base) MCG/ACT inhaler Inhale 2 puffs into the lungs every 6 (six) hours as needed for wheezing or shortness of breath. Qty: 1 Inhaler, Refills: 2   Associated Diagnoses: Essential hypertension, benign;  Chronic atrial fibrillation (HCC)    amLODipine (NORVASC) 10 MG tablet TAKE 1 TABLET BY MOUTH DAILY FOR HYPERTENSION Qty: 30 tablet, Refills: 3    apixaban (ELIQUIS) 2.5 MG TABS tablet Take 2.5 mg by mouth 2 (two) times daily.    atorvastatin (LIPITOR) 10 MG tablet Take 10 mg by mouth daily.    b complex vitamins tablet Take 1 tablet by mouth daily.     calcium-vitamin D (OSCAL WITH D) 500-200 MG-UNIT per tablet Take 1 tablet by mouth daily with breakfast.     chlorhexidine (PERIDEX) 0.12 % solution Use as directed 15 mLs in the mouth or throat 2 (two) times daily.    [employer] GAS RELIEF 80 MG chewable tablet CHEW 1 TABLET BY MOUTH 4 TIMES A DAY AS NEEDED FOR FLATULENCE Qty: 30 tablet, Refills: 0  diclofenac sodium (VOLTAREN) 1 % GEL Apply 2 g topically daily as needed (for pain).     feeding supplement (BOOST / RESOURCE BREEZE) LIQD Take 1 Container by mouth 3 (three) times daily between meals. Qty: 90 Container, Refills: 0    fluticasone (FLONASE) 50 MCG/ACT nasal spray Place 2 sprays into both nostrils daily. Qty: 16 g, Refills: 3    furosemide (LASIX) 20 MG tablet Take 1 tablet (20 mg total) by mouth daily. Qty: 30 tablet, Refills: 5   Associated Diagnoses: Acute on chronic diastolic congestive heart failure (HCC)    gabapentin (NEURONTIN) 300 MG capsule TAKE 1 CAPSULE (300 MG TOTAL) BY MOUTH 3 (THREE) TIMES DAILY. Qty: 90 capsule, Refills: 3    isosorbide mononitrate (IMDUR) 30 MG 24 hr tablet TAKE 1 TABLET (30 MG TOTAL) BY MOUTH DAILY. Refills: 2    lactulose (CHRONULAC) 10 GM/15ML solution Take 15 mLs (10 g total) by mouth daily as needed for mild constipation. Qty: 240 mL, Refills: 0    latanoprost (XALATAN) 0.005 % ophthalmic solution Place 1 drop into both eyes at bedtime. Qty: 2.5 mL, Refills: 12    Multiple Vitamins-Minerals (MULTIVITAMIN WITH MINERALS) tablet Take 1 tablet by mouth every morning.     pantoprazole (PROTONIX) 40 MG tablet TAKE 1 TABLET BY MOUTH  TWICE A DAY Qty: 60 tablet, Refills: 11    Polyethyl Glycol-Propyl Glycol 0.4-0.3 % SOLN Place 1 drop into both eyes 4 (four) times daily.     polyethylene glycol (MIRALAX / GLYCOLAX) packet Take 17 g by mouth 2 (two) times daily.     RESTASIS 0.05 % ophthalmic emulsion USE 1 DROP INTO BOTH EYES TWICE DAILY Qty: 60 mL, Refills: 1    sennosides-docusate sodium (SENOKOT-S) 8.6-50 MG tablet Take 1 tablet by mouth 2 (two) times daily.     sertraline (ZOLOFT) 100 MG tablet TAKE 1 TABLET BY MOUTH ONCE DAILY FOR DEPRESSION Qty: 30 tablet, Refills: 5    sodium chloride 1 g tablet TAKE 1 TABLET BY MOUTH THREE TIMES DAILY Qty: 90 tablet, Refills: 2    traMADol (ULTRAM) 50 MG tablet Take 50 mg by mouth 2 (two) times daily as needed for moderate pain.       STOP taking these medications     metoprolol succinate (TOPROL-XL) 100 MG 24 hr tablet           Outstanding Labs/Studies   None  Duration of Discharge Encounter   Greater than 30 minutes including physician time.  Antony Blackbird, DO PGY-2 Internal Medicine Resident Pager # [phone] 08/05/2015 9:54 AM

## 2015-08-05 NOTE — Progress Notes (Signed)
Patient is discharge to home accompanied by patient's daughter and voluntary staff  via wheelchair. Prescriptions and discharge instructions given . Patient verbalizes understanding. All personal belongings given. Telemetry box and IV removed prior to discharge and site in good condition.

## 2015-08-05 NOTE — Care Management Important Message (Signed)
Important Message  Patient Details  Name: Shannon [name] MRN: [id number] Date of Birth: Nov 27, 1923   Medicare Important Message Given:  Yes    Kazuko Clemence P Leiani Enright 08/05/2015, 12:30 PM

## 2015-08-05 NOTE — Discharge Instructions (Signed)
PLEASE TAKE ALL MEDICATIONS AS PRESCRIBED AND ATTEND YOUR FOLLOW UP APPOINTMENTS AS LISTED.  PLEASE MONITOR FOR SIGNS OF SHORTNESS OF BREATH AND LIGHTHEADEDNESS AS THESE CAN INDICATE A LOW HEART RATE OR FLUID OVERLOAD.

## 2015-08-05 NOTE — Progress Notes (Signed)
Subjective:  Shannon [name] is a 66 F with PMHx of HTN, h/o aflutter, CKD III, chronic diastolic CHF, 123456, CVD, and carotid stenosis who is admitted with bradycardia.   Patient was seen and examined this morning. She denies chest pain, shortness of breath, nausea, headache or lightheadedness. She does have some complaints of gas and eructation.     Objective:  Filed Vitals:   08/04/15 2024 08/04/15 2104 08/05/15 0503 08/05/15 0739  BP:  145/57 182/61   Pulse:  54 57   Temp:  98.3 F (36.8 C) 98.6 F (37 C)   TempSrc:  Oral Oral   Resp:  16 16   Height:      Weight:   149 lb 1.6 oz (67.631 kg)   SpO2: 93% 98% 97% 92%   Intake/Output from previous day:  Intake/Output Summary (Last 24 hours) at 08/05/15 0917 Last data filed at 08/05/15 [id number]  Gross per 24 hour  Intake    900 ml  Output   3152 ml  Net  -2252 ml   Physical Exam: General: Vital signs reviewed.  Patient is elderly, but appears younger than stated age, in no acute distress and cooperative with exam.  Cardiovascular: Bradycardic, regular rhythm, no murmurs, gallops, or rubs. Pulmonary/Chest: Minimal inspiratory crackles in lower lung fields bilaterally, no wheezes, or rhonchi. Abdominal: Soft, non-tender, non-distended, BS +  Extremities: 1+ pitting lower extremity edema bilaterally, TED hose in place. Skin: Warm, dry and intact.  Psychiatric: Normal mood and affect. speech and behavior is normal. Cognition and memory are grossly normal.   Lab Results: Basic Metabolic Panel:  Recent Labs  08/02/15 1638  08/04/15 0331 08/05/15 0353  NA 129*  131*  < > 128* 132*  K 5.2*  5.2*  < > 5.0 4.1  CL 99*  98*  < > 98* 100*  CO2 20*  < > 21* 21*  GLUCOSE 157*  148*  < > 171* 131*  BUN 26*  27*  < > 36* 30*  CREATININE 1.95*  1.90*  < > 2.35* 1.63*  CALCIUM 9.2  < > 9.2 9.0  MG 1.8  --   --   --   < > = values in this interval not displayed. CBC:  Recent Labs  08/02/15 1638  WBC 4.9  NEUTROABS 2.8    HGB 9.6*  10.9*  HCT 28.3*  32.0*  MCV 91.3  PLT 363   Assessment/Plan:  Shannon [name] is a 75 F with PMHx of HTN, h/o aflutter, CKD III, chronic diastolic CHF, 123456, CVD, and carotid stenosis who is admitted with bradycardia.   Symptomatic Bradycardia: Patient continues to be in sinus bradycardia with heart rate in the 50s while holding amiodarone and metoprolol. Given her history of atrial flutter with RVR, we will add back her amiodarone at a low dose.  -Restart amiodarone at 100 mg daily -Likely d/c home today  H/o Atrial Flutter: Patient is currently in sinus bradycardia. Given improvement in HR, we will restart her amiodarone at a low dose. -Continue Eliquis 2.5 mg BID -Restart amiodarone 100 mg daily  Chronic Diastolic CHF: Euvolemic, although minimal crackles on exam. No shortness of breath. Weight stable at 149. Net negative since admission.  -Holding home Lasix 20 mg daily given AKI, now resolved  HTN: Now hypertensive after holding hydralazine and decreasing amlodipine. 140-150s yesterday and up to 182/61 this morning.  -Increase amlodipine to 10 mg daily  -Add back Hydralazine at lower dose 25 mg TID -  Restart Imdur 30 mg daily -Continue to hold metoprolol  -Follow up outpatient  Acute on Chronic CKD, resolved: Creatinine improved from 2.35>1.63 this morning, at baseline.  -Continuing to hold home Lasix 20 mg daily -Follow up BMET as outpatient  Hyponatremia: Improving, 132 this morning. Patient has history of SIADH, previously took salt tabs for this. -Follow up with PCP  Hypothyroidism: TSH elevated at 83. Hypothyroidism possibly contributing to bradycardia. Synthroid has been increased from 25 mcg to 50 mcg daily. May need further titration as outpatient. -Continue Synthroid to 50 mcg daily -Repeat TSH in 6 weeks (June 13th)  Constipation:  -Continue home Miralax BID  CODE STATUS: Partial- no CPR, defibrillation, cardioversion or intubation- meds only. DVT/PE  ppx: Eliquis 2.5 mg BID FEN: Dysphagia 1 diet  Shannon Malay, DO PGY-2 Internal Medicine Resident Pager # [phone] 08/05/2015 9:17 AM  As above, patient seen and examined. She is much improved. She denies dyspnea. Occasional brief pain in her chest unchanged. Telemetry personally reviewed.Heart rate in the 50s and she is in sinus. Her Toprol has been discontinued. We will resume amiodarone at 100 mg daily for h/o atrial fluttter; continue apixaban. Her blood pressure has increased. Increase amlodipine to 10 mg daily. Resume hydralazine at 25 mg by mouth 3 times a day and Imdur 30 mg daily. I will plan to resume Lasix 20 mg daily tomorrow with BMET at [facility] fu appointment. Note her renal function is improving. Her sodium has also improved. Her Synthroid dose was increased during this hospitalization and she will need follow-up with primary care physician. She will follow-up with Dr. Meda Coffee long-term. > 30 min physician time [place]

## 2015-08-05 NOTE — Evaluation (Signed)
Physical Therapy Evaluation Patient Details Name: Shannon [name] MRN: [id number] DOB: 1923/12/26 Today's Date: 08/05/2015   History of Present Illness  Pt is a 82 y/o F who presents w/ symptomatic bradycardia felt to be secondary to her medications.  Pt's PMH includes SBO, stroke, COPD, anemia, depression.    Clinical Impression  Pt admitted with above diagnosis. Pt currently with functional limitations due to the deficits listed below (see PT Problem List). Ms. Genung presents w/ generalized weakness but only requiring supervision for safety w/ transfers and ambulation.  Daughter present during evaluation and will be able to provide 24/7 asist for pt at d/c.  PTA pt at mod I level of mobility. Pt will benefit from skilled PT to increase their independence and safety with mobility to allow discharge to the venue listed below.      Follow Up Recommendations Home health PT;Supervision for mobility/OOB    Equipment Recommendations  None recommended by PT    Recommendations for Other Services       Precautions / Restrictions Precautions Precautions: Fall Restrictions Weight Bearing Restrictions: No      Mobility  Bed Mobility Overal bed mobility: Independent             General bed mobility comments: No cues or physical assist needed  Transfers Overall transfer level: Needs assistance Equipment used: Rolling walker (2 wheeled) Transfers: Sit to/from Stand Sit to Stand: Supervision         General transfer comment: Pt w/ safe technique.  Supervision for safety.  Ambulation/Gait Ambulation/Gait assistance: Supervision Ambulation Distance (Feet): 200 Feet Assistive device: Rolling walker (2 wheeled) Gait Pattern/deviations: Step-through pattern;Decreased stride length;Trunk flexed   Gait velocity interpretation: Below normal speed for age/gender General Gait Details: Slightly flexed trunk.  HR 60s-70s while ambulating.  Slightly decreased gait speed.  Stairs             Wheelchair Mobility    Modified Rankin (Stroke Patients Only)       Balance Overall balance assessment: Needs assistance (denies h/o falls) Sitting-balance support: No upper extremity supported;Feet supported Sitting balance-Leahy Scale: Good     Standing balance support: Bilateral upper extremity supported;During functional activity Standing balance-Leahy Scale: Poor Standing balance comment: RW for support                             Pertinent Vitals/Pain Pain Assessment: No/denies pain    Home Living Family/patient expects to be discharged to:: Private residence Living Arrangements: Children Available Help at Discharge: Family;Available 24 hours/day Type of Home: House Home Access: Ramped entrance     Home Layout: One level Home Equipment: Walker - 2 wheels;Walker - 4 wheels;Cane - single point;Shower seat;Hand held Public house manager (rollator) Additional Comments: Lives w/ daughter, son in law, and grandson    Prior Function Level of Independence: Independent with assistive device(s)         Comments: Uses rollator during the day and RW at night.       Hand Dominance        Extremity/Trunk Assessment   Upper Extremity Assessment: Generalized weakness           Lower Extremity Assessment: Generalized weakness      Cervical / Trunk Assessment: Kyphotic  Communication   Communication: No difficulties  Cognition Arousal/Alertness: Awake/alert Behavior During Therapy: WFL for tasks assessed/performed Overall Cognitive Status: Within Functional Limits for tasks assessed  General Comments General comments (skin integrity, edema, etc.): Daughter present throughout session    Exercises        Assessment/Plan    PT Assessment Patient needs continued PT services  PT Diagnosis Generalized weakness   PT Problem List Decreased strength;Decreased balance  PT Treatment Interventions Gait training;DME  instruction;Functional mobility training;Therapeutic activities;Therapeutic exercise;Balance training;Patient/family education   PT Goals (Current goals can be found in the Care Plan section) Acute Rehab PT Goals Patient Stated Goal: to go home PT Goal Formulation: With patient/family Time For Goal Achievement: 08/19/15 Potential to Achieve Goals: Good    Frequency Min 3X/week   Barriers to discharge        Co-evaluation               End of Session Equipment Utilized During Treatment: Gait belt Activity Tolerance: Patient tolerated treatment well Patient left: in bed;with call bell/phone within reach;with family/visitor present (sitting EOB) Nurse Communication: Mobility status         Time: 1207-1221 PT Time Calculation (min) (ACUTE ONLY): 14 min   Charges:   PT Evaluation $PT Eval Low Complexity: 1 Procedure     PT G Codes:       Encarnacion Chu PT, DPT  Pager: [phone] Phone: [phone] 08/05/2015, 12:32 PM

## 2015-08-06 ENCOUNTER — Ambulatory Visit (INDEPENDENT_AMBULATORY_CARE_PROVIDER_SITE_OTHER): Payer: Medicare Other | Admitting: Internal Medicine

## 2015-08-06 VITALS — BP 160/74 | HR 90 | Temp 97.4°F | Resp 20 | Ht 64.0 in | Wt 147.6 lb

## 2015-08-06 DIAGNOSIS — E1142 Type 2 diabetes mellitus with diabetic polyneuropathy: Secondary | ICD-10-CM | POA: Diagnosis not present

## 2015-08-06 DIAGNOSIS — I4892 Unspecified atrial flutter: Secondary | ICD-10-CM | POA: Diagnosis not present

## 2015-08-06 DIAGNOSIS — I10 Essential (primary) hypertension: Secondary | ICD-10-CM

## 2015-08-06 DIAGNOSIS — L8990 Pressure ulcer of unspecified site, unspecified stage: Secondary | ICD-10-CM

## 2015-08-06 DIAGNOSIS — R600 Localized edema: Secondary | ICD-10-CM

## 2015-08-06 DIAGNOSIS — E039 Hypothyroidism, unspecified: Secondary | ICD-10-CM | POA: Diagnosis not present

## 2015-08-06 MED ORDER — GABAPENTIN 300 MG PO CAPS: ORAL_CAPSULE | ORAL | Status: DC

## 2015-08-06 NOTE — Progress Notes (Signed)
Patient ID: Shannon [name], female   DOB: Mar 01, 1924, 82 y.o.   MRN: [id number]    Location:    [facility]   Place of Service:  OFFICE   Chief Complaint  Patient presents with  . Medical Management of Chronic Issues    2 mo f/u & hosp f/u    HPI:  82 yo female seen today for hospital f/u. She was d/cd on yesterday with dx of symptomatic bradycardia, accelerated HTN, hyponatremia, SIADH, acute/CKD stage 3, hypothyroidism and hyperlipidemia. She presented to the ED from cardiology office after HR found in 30s thought to be med induced (BB and amio). She was started on IV dopamine and admitted to telemetry. BB and amiodarone held initially. Amio then resumed later at a lower dose due to hx atrial flutter. She was tx with IV lasix due to mild volume overload but had to be stopped due to AKI. BP meds adjusted due to hypotension. Thyroid med adjusted due to TSH 83.858. Cr 1.95-->2.35-->1.63 at d/c; Na 129-->132 at d/c. Pulse 57 at d/c  Today she reports no SOB or dizziness. She will see cardiology next week. She has right foot pain and c/o neuropathy in b/l leg/foot. She takes gabapentin 900mg  total daily. She noticed a sore on 1st toe this AM. It is draining serosanguinous d/c with odor. She is scheduled to see foot specialist Dr Ardelle Anton on May 16th. She as been keeping it clean with soap and water and application of betadine then covering with 4x4 pad.  She has seasonal allergy sx's and takes zyrtec daily and has flonase. She takes advair for asthma  She does not take any medications for diabetes. She has numbness or tingling in feet. She would like to see podiatry due to foot deformities. She has been going to [facility] [place] for foot care.  No seizures since last OV. Takes gabapentin  HTN/afib/hyperlipidemia - BP controlled on meds. Cholesterol stable on lipitor. Rate controlled on amiodarone. She takes eliquis for anticooagulation  GI told her to f/u prn. She is still doing pureed diet. No dysphagia or  dyspesia. Takes PPI BID. Constipation stable on lactulose  Hyponatremia/ hx SIADH- she was taking salt tabs but over the last several weeks has noticed the entire tab in her stool intermittently. Na 132   DM - controlled. A1c 6.4%. Takes gabapentin for neuropathy  Hx CVA - stable. Takes ASA daily  Depression - mood stable on sertraline  Arthritis - joint pain stable on tramadol and voltaren gel  Hypothyroidism - levothyroxine adjusted to daily prior to d/c.  Past Medical History  Diagnosis Date  . Pneumonia   . Hypertension   . Small bowel obstruction (HCC)     "I don't know how many" (01/11/2015)  . Ventral hernia with bowel obstruction   . Asthma   . Hyperlipidemia   . Stroke (HCC)     "light one"  . COPD (chronic obstructive pulmonary disease) (HCC)   . Hypothyroidism   . Type II diabetes mellitus (HCC)   . Anemia     previous blood transfusions  . GERD (gastroesophageal reflux disease)   . History of stomach ulcers   . Arthritis     "all over"  . Renal insufficiency   . Depression     "light case"  . SIADH (syndrome of inappropriate ADH production) (HCC)     [name] 01/10/2015  . Perforated gastric ulcer (HCC)   . Gastric stenosis   . Paraesophageal hernia   . Bradycardia  Patient ID: Shannon [name], female   DOB: May 27, 1923, 82 y.o.   MRN: [id number]    Location:    [facility]   Place of Service:  OFFICE   Chief Complaint  Patient presents with  . Medical Management of Chronic Issues    2 mo f/u & hosp f/u    HPI:  82 yo female seen today for hospital f/u. She was d/cd on yesterday with dx of symptomatic bradycardia, accelerated HTN, hyponatremia, SIADH, acute/CKD stage 3, hypothyroidism and hyperlipidemia. She presented to the ED from cardiology office after HR found in 30s thought to be med induced (BB and amio). She was started on IV dopamine and admitted to telemetry. BB and amiodarone held initially. Amio then resumed later at a lower dose due to hx atrial flutter. She was tx with IV lasix due to mild volume overload but had to be stopped due to AKI. BP meds adjusted due to hypotension. Thyroid med adjusted due to TSH 83.858. Cr 1.95-->2.35-->1.63 at d/c; Na 129-->132 at d/c. Pulse 57 at d/c  Today she reports no SOB or dizziness. She will see cardiology next week. She has right foot pain and c/o neuropathy in b/l leg/foot. She takes gabapentin 943m total daily. She noticed a sore on 1st toe this AM. It is draining serosanguinous d/c with odor. She is scheduled to see foot specialist Dr WJacqualyn Poseyon May 16th. She as been keeping it clean with soap and water and application of betadine then covering with 4x4 pad.  She has seasonal allergy sx's and takes zyrtec daily and has flonase. She takes advair for asthma  She does not take any medications for diabetes. She has numbness or tingling in feet. She would like to see podiatry due to foot deformities. [place]  No seizures since last OV. Takes gabapentin  HTN/afib/hyperlipidemia - BP controlled on meds. Cholesterol stable on lipitor. Rate controlled on amiodarone. She takes eliquis for anticooagulation  GI told her to f/u prn. She is still doing pureed diet. No dysphagia or  dyspesia. Takes PPI BID. Constipation stable on lactulose  Hyponatremia/ hx SIADH- she was taking salt tabs but over the last several weeks has noticed the entire tab in her stool intermittently. Na 132   DM - controlled. A1c 6.4%. Takes gabapentin for neuropathy  Hx CVA - stable. Takes ASA daily  Depression - mood stable on sertraline  Arthritis - joint pain stable on tramadol and voltaren gel  Hypothyroidism - levothyroxine adjusted to 543m daily prior to d/c.  Past Medical History  Diagnosis Date  . Pneumonia   . Hypertension   . Small bowel obstruction ([facility]    "I don't know how many" (01/11/2015)  . Ventral hernia with bowel obstruction   . Asthma   . Hyperlipidemia   . Stroke [place]    "light one"  . COPD ([facility] obstructive [place] disease) ([facility]  . Hypothyroidism   . Type II diabetes mellitus ([facility]  . Anemia     previous blood transfusions  . GERD (gastroesophageal reflux disease)   . History of stomach ulcers   . Arthritis     "all over"  . Renal insufficiency   . Depression     "light case"  . SIADH (syndrome of inappropriate ADH production) ([facility]    /nArchie Endo0/12/2014  . Perforated gastric ulcer ([facility]  . Gastric stenosis   . Paraesophageal hernia   . Bradycardia  ELIQUIS) 2.5 MG TABS TABLET    Take 2.5 mg by mouth 2 (two) times daily.   ATORVASTATIN (LIPITOR) 10 MG TABLET    Take 10 mg by mouth daily.   B COMPLEX VITAMINS TABLET    Take 1 tablet by mouth daily.    CALCIUM-VITAMIN D (OSCAL WITH D) 500-200 MG-UNIT PER TABLET    Take 1 tablet by mouth daily with breakfast.    CHLORHEXIDINE (PERIDEX) 0.12 % SOLUTION    Use as directed 15 mLs in the mouth or throat 2 (two) times daily.   [employer] GAS RELIEF 80 MG CHEWABLE TABLET    CHEW 1 TABLET BY MOUTH 4 TIMES A DAY AS NEEDED FOR FLATULENCE   DICLOFENAC SODIUM (VOLTAREN) 1 % GEL    Apply 2 g topically daily as needed (for pain).    FEEDING SUPPLEMENT (BOOST / RESOURCE BREEZE) LIQD    Take 1 Container by mouth 3 (three) times daily between meals.   FLUTICASONE (FLONASE) 50 MCG/ACT NASAL SPRAY    Place 2 sprays into both nostrils daily.   FUROSEMIDE (LASIX) 20 MG TABLET    Take 1 tablet (20 mg total) by mouth daily.   GABAPENTIN (NEURONTIN) 300 MG CAPSULE    TAKE 1 CAPSULE (300 MG TOTAL) BY MOUTH 3 (THREE) TIMES DAILY.   HYDRALAZINE (APRESOLINE) 25 MG TABLET    Take 1 tablet (25 mg total) by mouth every 8 (eight) hours.   ISOSORBIDE MONONITRATE (IMDUR) 30 MG 24 HR TABLET    TAKE 1 TABLET (30 MG TOTAL) BY MOUTH DAILY.   LACTULOSE (CHRONULAC) 10 GM/15ML SOLUTION    Take 15 mLs (10 g total) by mouth daily as  needed for mild constipation.   LATANOPROST (XALATAN) 0.005 % OPHTHALMIC SOLUTION    Place 1 drop into both eyes at bedtime.   LEVOTHYROXINE (SYNTHROID, LEVOTHROID) 50 MCG TABLET    Take 1 tablet (50 mcg total) by mouth daily before breakfast.   MULTIPLE VITAMINS-MINERALS (MULTIVITAMIN WITH MINERALS) TABLET    Take 1 tablet by mouth every morning.    PANTOPRAZOLE (PROTONIX) 40 MG TABLET    TAKE 1 TABLET BY MOUTH TWICE A DAY   POLYETHYL GLYCOL-PROPYL GLYCOL 0.4-0.3 % SOLN    Place 1 drop into both eyes 4 (four) times daily.    POLYETHYLENE GLYCOL (MIRALAX / GLYCOLAX) PACKET    Take 17 g by mouth 2 (two) times daily.    RESTASIS 0.05 % OPHTHALMIC EMULSION    USE 1 DROP INTO BOTH EYES TWICE DAILY   SENNOSIDES-DOCUSATE SODIUM (SENOKOT-S) 8.6-50 MG TABLET    Take 1 tablet by mouth 2 (two) times daily.    SERTRALINE (ZOLOFT) 100 MG TABLET    TAKE 1 TABLET BY MOUTH ONCE DAILY FOR DEPRESSION   SODIUM CHLORIDE 1 G TABLET    TAKE 1 TABLET BY MOUTH THREE TIMES DAILY   TRAMADOL (ULTRAM) 50 MG TABLET    Take 50 mg by mouth 2 (two) times daily as needed for moderate pain.   Modified Medications   No medications on file  Discontinued Medications   No medications on file    Review of Systems  Musculoskeletal: Positive for joint swelling and arthralgias.  Skin: Positive for wound.  All other systems reviewed and are negative.   Filed Vitals:   08/06/15 1109  Pulse: 90  Temp: 97.4 F (36.3 C)  TempSrc: Oral  Resp: 20  Height: 5\' 4"  (1.626 m)  Weight: 147 lb 9.6 oz (66.951 kg)  SpO2: 95%  Patient ID: Shannon [name], female   DOB: May 27, 1923, 82 y.o.   MRN: [id number]    Location:    [facility]   Place of Service:  OFFICE   Chief Complaint  Patient presents with  . Medical Management of Chronic Issues    2 mo f/u & hosp f/u    HPI:  82 yo female seen today for hospital f/u. She was d/cd on yesterday with dx of symptomatic bradycardia, accelerated HTN, hyponatremia, SIADH, acute/CKD stage 3, hypothyroidism and hyperlipidemia. She presented to the ED from cardiology office after HR found in 30s thought to be med induced (BB and amio). She was started on IV dopamine and admitted to telemetry. BB and amiodarone held initially. Amio then resumed later at a lower dose due to hx atrial flutter. She was tx with IV lasix due to mild volume overload but had to be stopped due to AKI. BP meds adjusted due to hypotension. Thyroid med adjusted due to TSH 83.858. Cr 1.95-->2.35-->1.63 at d/c; Na 129-->132 at d/c. Pulse 57 at d/c  Today she reports no SOB or dizziness. She will see cardiology next week. She has right foot pain and c/o neuropathy in b/l leg/foot. She takes gabapentin 943m total daily. She noticed a sore on 1st toe this AM. It is draining serosanguinous d/c with odor. She is scheduled to see foot specialist Dr WJacqualyn Poseyon May 16th. She as been keeping it clean with soap and water and application of betadine then covering with 4x4 pad.  She has seasonal allergy sx's and takes zyrtec daily and has flonase. She takes advair for asthma  She does not take any medications for diabetes. She has numbness or tingling in feet. She would like to see podiatry due to foot deformities. [place]  No seizures since last OV. Takes gabapentin  HTN/afib/hyperlipidemia - BP controlled on meds. Cholesterol stable on lipitor. Rate controlled on amiodarone. She takes eliquis for anticooagulation  GI told her to f/u prn. She is still doing pureed diet. No dysphagia or  dyspesia. Takes PPI BID. Constipation stable on lactulose  Hyponatremia/ hx SIADH- she was taking salt tabs but over the last several weeks has noticed the entire tab in her stool intermittently. Na 132   DM - controlled. A1c 6.4%. Takes gabapentin for neuropathy  Hx CVA - stable. Takes ASA daily  Depression - mood stable on sertraline  Arthritis - joint pain stable on tramadol and voltaren gel  Hypothyroidism - levothyroxine adjusted to 543m daily prior to d/c.  Past Medical History  Diagnosis Date  . Pneumonia   . Hypertension   . Small bowel obstruction ([facility]    "I don't know how many" (01/11/2015)  . Ventral hernia with bowel obstruction   . Asthma   . Hyperlipidemia   . Stroke ([facility]    "light one"  . COPD (chronic obstructive pulmonary disease) ([facility]  . Hypothyroidism   . Type II diabetes mellitus ([facility]  . Anemia     previous blood transfusions  . GERD (gastroesophageal reflux disease)   . History of stomach ulcers   . Arthritis     "all over"  . Renal insufficiency   . Depression     "light case"  . SIADH (syndrome of inappropriate ADH production) ([facility]    /nArchie Endo0/12/2014  . Perforated gastric ulcer ([facility]  . Gastric stenosis   . Paraesophageal hernia   . Bradycardia  0.7 K/uL Final  . Basophils Relative 08/02/2015 1   Final  . Basophils Absolute 08/02/2015 0.1  0.0 - 0.1 K/uL Final  . Sodium 08/02/2015 129* 135 - 145 mmol/L Final  . Potassium 08/02/2015 5.2* 3.5 - 5.1 mmol/L Final  . Chloride 08/02/2015 99* 101 - 111 mmol/L Final  . CO2 08/02/2015 20* 22 - 32 mmol/L Final  . Glucose, Bld 08/02/2015 157* 65 - 99 mg/dL Final  . BUN 13/24/4010 26* 6 - 20 mg/dL Final  . Creatinine, Ser 08/02/2015 1.95* 0.44 - 1.00 mg/dL Final  . Calcium 27/25/3664 9.2  8.9 - 10.3 mg/dL Final  . GFR calc non Af Amer 08/02/2015 21* >60 mL/min Final  . GFR calc Af Amer 08/02/2015 25* >60 mL/min Final   Comment: (NOTE) The eGFR has been calculated using the CKD EPI equation. This calculation has not been validated in all clinical situations. eGFR's persistently <60 mL/min signify possible Chronic Kidney Disease.   . Anion gap 08/02/2015 10  5 - 15 Final  . TSH 08/02/2015 83.858* 0.350 - 4.500 uIU/mL Final  . Magnesium 08/02/2015 1.8  1.7 - 2.4 mg/dL Final  . Prothrombin Time 08/02/2015 15.7* 11.6 - 15.2 seconds Final  . INR 08/02/2015 1.24  0.00 - 1.49 Final  . B Natriuretic Peptide  08/02/2015 731.4* 0.0 - 100.0 pg/mL Final  . Sodium 08/03/2015 132* 135 - 145 mmol/L Final  . Potassium 08/03/2015 5.9* 3.5 - 5.1 mmol/L Final   SPECIMEN HEMOLYZED. HEMOLYSIS MAY AFFECT INTEGRITY OF RESULTS.  [name] Chloride 08/03/2015 101  101 - 111 mmol/L Final  . CO2 08/03/2015 17* 22 - 32 mmol/L Final  . Glucose, Bld 08/03/2015 168* 65 - 99 mg/dL Final  . BUN 40/34/7425 29* 6 - 20 mg/dL Final  . Creatinine, Ser 08/03/2015 2.12* 0.44 - 1.00 mg/dL Final  . Calcium 95/63/8756 9.5  8.9 - 10.3 mg/dL Final  . GFR calc non Af Amer 08/03/2015 19* >60 mL/min Final  . GFR calc Af Amer 08/03/2015 22* >60 mL/min Final   Comment: (NOTE) The eGFR has been calculated using the CKD EPI equation. This calculation has not been validated in all clinical situations. eGFR's persistently <60 mL/min signify possible Chronic Kidney Disease.   . Anion gap 08/03/2015 14  5 - 15 Final  . MRSA by PCR 08/03/2015 NEGATIVE  NEGATIVE Final   Comment:        The GeneXpert MRSA Assay (FDA approved for NASAL specimens only), is one component of a comprehensive MRSA colonization surveillance program. It is not intended to diagnose MRSA infection nor to guide or monitor treatment for MRSA infections.   . Sodium 08/03/2015 128* 135 - 145 mmol/L Final  . Potassium 08/03/2015 5.0  3.5 - 5.1 mmol/L Final  . Chloride 08/03/2015 97* 101 - 111 mmol/L Final  . CO2 08/03/2015 21* 22 - 32 mmol/L Final  . Glucose, Bld 08/03/2015 162* 65 - 99 mg/dL Final  . BUN 43/32/9518 30* 6 - 20 mg/dL Final  . Creatinine, Ser 08/03/2015 2.20* 0.44 - 1.00 mg/dL Final  . Calcium 84/16/6063 9.3  8.9 - 10.3 mg/dL Final  . GFR calc non Af Amer 08/03/2015 19* >60 mL/min Final  . GFR calc Af Amer 08/03/2015 21* >60 mL/min Final   Comment: (NOTE) The eGFR has been calculated using the CKD EPI equation. This calculation has not been validated in all clinical situations. eGFR's persistently <60 mL/min signify possible Chronic Kidney Disease.    . Anion gap 08/03/2015 10  5 -  Patient ID: Shannon [name], female   DOB: May 27, 1923, 82 y.o.   MRN: [id number]    Location:    [facility]   Place of Service:  OFFICE   Chief Complaint  Patient presents with  . Medical Management of Chronic Issues    2 mo f/u & hosp f/u    HPI:  82 yo female seen today for hospital f/u. She was d/cd on yesterday with dx of symptomatic bradycardia, accelerated HTN, hyponatremia, SIADH, acute/CKD stage 3, hypothyroidism and hyperlipidemia. She presented to the ED from cardiology office after HR found in 30s thought to be med induced (BB and amio). She was started on IV dopamine and admitted to telemetry. BB and amiodarone held initially. Amio then resumed later at a lower dose due to hx atrial flutter. She was tx with IV lasix due to mild volume overload but had to be stopped due to AKI. BP meds adjusted due to hypotension. Thyroid med adjusted due to TSH 83.858. Cr 1.95-->2.35-->1.63 at d/c; Na 129-->132 at d/c. Pulse 57 at d/c  Today she reports no SOB or dizziness. She will see cardiology next week. She has right foot pain and c/o neuropathy in b/l leg/foot. She takes gabapentin 943m total daily. She noticed a sore on 1st toe this AM. It is draining serosanguinous d/c with odor. She is scheduled to see foot specialist Dr WJacqualyn Poseyon May 16th. She as been keeping it clean with soap and water and application of betadine then covering with 4x4 pad.  She has seasonal allergy sx's and takes zyrtec daily and has flonase. She takes advair for asthma  She does not take any medications for diabetes. She has numbness or tingling in feet. She would like to see podiatry due to foot deformities. [place]  No seizures since last OV. Takes gabapentin  HTN/afib/hyperlipidemia - BP controlled on meds. Cholesterol stable on lipitor. Rate controlled on amiodarone. She takes eliquis for anticooagulation  GI told her to f/u prn. She is still doing pureed diet. No dysphagia or  dyspesia. Takes PPI BID. Constipation stable on lactulose  Hyponatremia/ hx SIADH- she was taking salt tabs but over the last several weeks has noticed the entire tab in her stool intermittently. Na 132   DM - controlled. A1c 6.4%. Takes gabapentin for neuropathy  Hx CVA - stable. Takes ASA daily  Depression - mood stable on sertraline  Arthritis - joint pain stable on tramadol and voltaren gel  Hypothyroidism - levothyroxine adjusted to 543m daily prior to d/c.  Past Medical History  Diagnosis Date  . Pneumonia   . Hypertension   . Small bowel obstruction ([facility]    "I don't know how many" (01/11/2015)  . Ventral hernia with bowel obstruction   . Asthma   . Hyperlipidemia   . Stroke ([facility]    "light one"  . COPD (chronic obstructive pulmonary disease) ([facility]  . Hypothyroidism   . Type II diabetes mellitus ([facility]  . Anemia     previous blood transfusions  . GERD (gastroesophageal reflux disease)   . History of stomach ulcers   . Arthritis     "all over"  . Renal insufficiency   . Depression     "light case"  . SIADH (syndrome of inappropriate ADH production) ([facility]    /nArchie Endo0/12/2014  . Perforated gastric ulcer ([facility]  . Gastric stenosis   . Paraesophageal hernia   . Bradycardia  26.6 - 33.0 pg Final  . MCHC 07/14/2015 33.6  31.5 - 35.7 g/dL Final  . RDW 16/01/9603 16.5* 12.3 - 15.4 % Final  . Neutrophils 07/14/2015 65   Final  . Lymphs 07/14/2015 21   Final  . Monocytes 07/14/2015 10   Final  . Eos 07/14/2015 3   Final  . Basos 07/14/2015 0   Final  . Neutrophils Absolute 07/14/2015 4.0  1.4 - 7.0 x10E3/uL Final  . Lymphocytes Absolute 07/14/2015 1.3  0.7 - 3.1 x10E3/uL Final  . Monocytes Absolute 07/14/2015 0.6  0.1 - 0.9 x10E3/uL Final  . EOS (ABSOLUTE) 07/14/2015 0.2  0.0 - 0.4 x10E3/uL Final  . Basophils Absolute 07/14/2015 0.0  0.0 - 0.2 x10E3/uL Final  . Immature Granulocytes 07/14/2015 1   Final  . Immature Grans (Abs) 07/14/2015 0.0  0.0 - 0.1 x10E3/uL Final  . Glucose 07/14/2015 132* 65 - 99 mg/dL Final  . BUN 54/12/8117 27  10 - 36 mg/dL Final  . Creatinine, Ser 07/14/2015 1.24* 0.57 - 1.00 mg/dL Final  . GFR calc non Af Amer 07/14/2015 38* >59  mL/min/1.73 Final  . GFR calc Af Amer 07/14/2015 44* >59 mL/min/1.73 Final  . BUN/Creatinine Ratio 07/14/2015 22  12 - 28 Final  . Sodium 07/14/2015 140  134 - 144 mmol/L Final  . Potassium 07/14/2015 4.5  3.5 - 5.2 mmol/L Final  . Chloride 07/14/2015 101  96 - 106 mmol/L Final  . CO2 07/14/2015 24  18 - 29 mmol/L Final  . Calcium 07/14/2015 9.4  8.7 - 10.3 mg/dL Final  . Total Protein 07/14/2015 5.8* 6.0 - 8.5 g/dL Final  . Albumin 14/78/2956 3.7  3.2 - 4.6 g/dL Final  . Globulin, Total 07/14/2015 2.1  1.5 - 4.5 g/dL Final  . Albumin/Globulin Ratio 07/14/2015 1.8  1.2 - 2.2 Final  . Bilirubin Total 07/14/2015 0.3  0.0 - 1.2 mg/dL Final  . Alkaline Phosphatase 07/14/2015 60  39 - 117 IU/L Final  . AST 07/14/2015 19  0 - 40 IU/L Final  . ALT 07/14/2015 16  0 - 32 IU/L Final  . TSH 07/14/2015 4.260  0.450 - 4.500 uIU/mL Final  Admission on 07/05/2015, Discharged on 07/11/2015  No results displayed because visit has over 200 results.    Admission on 06/20/2015, Discharged on 06/20/2015  Component Date Value Ref Range Status  . Troponin i, poc 06/20/2015 0.03  0.00 - 0.08 ng/mL Final  . Comment 3 06/20/2015          Final   Comment: Due to the release kinetics of cTnI, a negative result within the first hours of the onset of symptoms does not rule out myocardial infarction with certainty. If myocardial infarction is still suspected, repeat the test at appropriate intervals.   . Lactic Acid, Venous 06/20/2015 2.03* 0.5 - 2.0 mmol/L Final  . Comment 06/20/2015 NOTIFIED PHYSICIAN   Final  . WBC 06/20/2015 4.9  4.0 - 10.5 K/uL Final  . RBC 06/20/2015 3.48* 3.87 - 5.11 MIL/uL Final  . Hemoglobin 06/20/2015 10.5* 12.0 - 15.0 g/dL Final  . HCT 21/30/8657 30.8* 36.0 - 46.0 % Final  . MCV 06/20/2015 88.5  78.0 - 100.0 fL Final  . MCH 06/20/2015 30.2  26.0 - 34.0 pg Final  . MCHC 06/20/2015 34.1  30.0 - 36.0 g/dL Final  . RDW 84/69/6295 14.6  11.5 - 15.5 % Final  . Platelets 06/20/2015 178   150 - 400 K/uL Final  . Neutrophils Relative % 06/20/2015  Patient ID: Shannon [name], female   DOB: May 27, 1923, 82 y.o.   MRN: [id number]    Location:    [facility]   Place of Service:  OFFICE   Chief Complaint  Patient presents with  . Medical Management of Chronic Issues    2 mo f/u & hosp f/u    HPI:  82 yo female seen today for hospital f/u. She was d/cd on yesterday with dx of symptomatic bradycardia, accelerated HTN, hyponatremia, SIADH, acute/CKD stage 3, hypothyroidism and hyperlipidemia. She presented to the ED from cardiology office after HR found in 30s thought to be med induced (BB and amio). She was started on IV dopamine and admitted to telemetry. BB and amiodarone held initially. Amio then resumed later at a lower dose due to hx atrial flutter. She was tx with IV lasix due to mild volume overload but had to be stopped due to AKI. BP meds adjusted due to hypotension. Thyroid med adjusted due to TSH 83.858. Cr 1.95-->2.35-->1.63 at d/c; Na 129-->132 at d/c. Pulse 57 at d/c  Today she reports no SOB or dizziness. She will see cardiology next week. She has right foot pain and c/o neuropathy in b/l leg/foot. She takes gabapentin 943m total daily. She noticed a sore on 1st toe this AM. It is draining serosanguinous d/c with odor. She is scheduled to see foot specialist Dr WJacqualyn Poseyon May 16th. She as been keeping it clean with soap and water and application of betadine then covering with 4x4 pad.  She has seasonal allergy sx's and takes zyrtec daily and has flonase. She takes advair for asthma  She does not take any medications for diabetes. She has numbness or tingling in feet. She would like to see podiatry due to foot deformities. [place]  No seizures since last OV. Takes gabapentin  HTN/afib/hyperlipidemia - BP controlled on meds. Cholesterol stable on lipitor. Rate controlled on amiodarone. She takes eliquis for anticooagulation  GI told her to f/u prn. She is still doing pureed diet. No dysphagia or  dyspesia. Takes PPI BID. Constipation stable on lactulose  Hyponatremia/ hx SIADH- she was taking salt tabs but over the last several weeks has noticed the entire tab in her stool intermittently. Na 132   DM - controlled. A1c 6.4%. Takes gabapentin for neuropathy  Hx CVA - stable. Takes ASA daily  Depression - mood stable on sertraline  Arthritis - joint pain stable on tramadol and voltaren gel  Hypothyroidism - levothyroxine adjusted to 543m daily prior to d/c.  Past Medical History  Diagnosis Date  . Pneumonia   . Hypertension   . Small bowel obstruction ([facility]    "I don't know how many" (01/11/2015)  . Ventral hernia with bowel obstruction   . Asthma   . Hyperlipidemia   . Stroke ([facility] [place]    "light one"  . COPD (chronic obstructive pulmonary disease) ([facility]  . Hypothyroidism   . Type II diabetes mellitus ([facility]  . Anemia     previous blood transfusions  . GERD (gastroesophageal reflux disease)   . History of stomach ulcers   . Arthritis     "all over"  . Renal insufficiency   . Depression     "light case"  . SIADH (syndrome of inappropriate ADH production) ([facility]    /nArchie Endo0/12/2014  . Perforated gastric ulcer ([facility] [place]  . Gastric stenosis   . Paraesophageal hernia   . Bradycardia  Patient ID: Shannon [name], female   DOB: May 27, 1923, 82 y.o.   MRN: [id number]    Location:    [facility]   Place of Service:  OFFICE   Chief Complaint  Patient presents with  . Medical Management of Chronic Issues    2 mo f/u & hosp f/u    HPI:  82 yo female seen today for hospital f/u. She was d/cd on yesterday with dx of symptomatic bradycardia, accelerated HTN, hyponatremia, SIADH, acute/CKD stage 3, hypothyroidism and hyperlipidemia. She presented to the ED from cardiology office after HR found in 30s thought to be med induced (BB and amio). She was started on IV dopamine and admitted to telemetry. BB and amiodarone held initially. Amio then resumed later at a lower dose due to hx atrial flutter. She was tx with IV lasix due to mild volume overload but had to be stopped due to AKI. BP meds adjusted due to hypotension. Thyroid med adjusted due to TSH 83.858. Cr 1.95-->2.35-->1.63 at d/c; Na 129-->132 at d/c. Pulse 57 at d/c  Today she reports no SOB or dizziness. She will see cardiology next week. She has right foot pain and c/o neuropathy in b/l leg/foot. She takes gabapentin 943m total daily. She noticed a sore on 1st toe this AM. It is draining serosanguinous d/c with odor. She is scheduled to see foot specialist Dr WJacqualyn Poseyon May 16th. She as been keeping it clean with soap and water and application of betadine then covering with 4x4 pad.  She has seasonal allergy sx's and takes zyrtec daily and has flonase. She takes advair for asthma  She does not take any medications for diabetes. She has numbness or tingling in feet. She would like to see podiatry due to foot deformities. [place] [facility]  No seizures since last OV. Takes gabapentin  HTN/afib/hyperlipidemia - BP controlled on meds. Cholesterol stable on lipitor. Rate controlled on amiodarone. She takes eliquis for anticooagulation  GI told her to f/u prn. She is still doing pureed diet. No dysphagia or  dyspesia. Takes PPI BID. Constipation stable on lactulose  Hyponatremia/ hx SIADH- she was taking salt tabs but over the last several weeks has noticed the entire tab in her stool intermittently. Na 132   DM - controlled. A1c 6.4%. Takes gabapentin for neuropathy  Hx CVA - stable. Takes ASA daily  Depression - mood stable on sertraline  Arthritis - joint pain stable on tramadol and voltaren gel  Hypothyroidism - levothyroxine adjusted to 543m daily prior to d/c.  Past Medical History  Diagnosis Date  . Pneumonia   . Hypertension   . Small bowel obstruction ([facility]    "I don't know how many" (01/11/2015)  . Ventral hernia with bowel obstruction   . Asthma   . Hyperlipidemia   . Stroke ([facility] [place]    "light one"  . COPD (chronic obstructive pulmonary disease) ([facility]  . Hypothyroidism   . Type II diabetes mellitus ([facility]  . Anemia     previous blood transfusions  . GERD (gastroesophageal reflux disease)   . History of stomach ulcers   . Arthritis     "all over"  . Renal insufficiency   . Depression     "light case"  . SIADH (syndrome of inappropriate ADH production) ([facility]    /nArchie Endo0/12/2014  . Perforated gastric ulcer ([facility] [place]  . Gastric stenosis   . Paraesophageal hernia   . Bradycardia  to Wound Clinic   707.20     right 1st toe and 2nd toe  2. Diabetic polyneuropathy associated with type 2 diabetes mellitus (HCC) 250.60 E11.42 Ambulatory referral to Wound Clinic   357.2  gabapentin (NEURONTIN) 300 MG capsule  3. Hypothyroidism, unspecified hypothyroidism type 244.9 E03.9 TSH     T4, Free  4. Edema of both legs 782.3 R60.0   5. Atrial flutter, unspecified type (HCC) 427.32 I48.92   6. Essential hypertension, benign 401.1 I10     Will need repeat BMP  F/u with cardiology as scheduled  Increase gabapentin to 2 caps in the morning and at bedtime. Take 1 cap at noon daily.  Continue other medications as ordered  Will call with wound clinic appt  Repeat TSH in 6 weeks. Cancel May lab appt  Follow up in 2 mos for routine visit  Mendel Binsfeld S. Ancil Linsey  [facility] [address] [place], [place]  [address] [phone] Cell (Monday-Friday 8 AM - 5 PM) [phone] After 5 PM and follow prompts

## 2015-08-06 NOTE — Patient Instructions (Signed)
Increase gabapentin to 2 caps in the morning and at bedtime. Take 1 cap at noon daily.  Continue other medications as ordered  Will call with wound clinic appt  Repeat TSH in 6 weeks. Cancel May lab appt  Follow up in 2 mos for routine visit

## 2015-08-07 ENCOUNTER — Other Ambulatory Visit: Payer: Self-pay | Admitting: Internal Medicine

## 2015-08-10 ENCOUNTER — Other Ambulatory Visit: Payer: Self-pay | Admitting: Internal Medicine

## 2015-08-10 ENCOUNTER — Encounter: Payer: Self-pay | Admitting: Cardiology

## 2015-08-10 ENCOUNTER — Ambulatory Visit (INDEPENDENT_AMBULATORY_CARE_PROVIDER_SITE_OTHER): Payer: Medicare Other | Admitting: Cardiology

## 2015-08-10 VITALS — BP 160/60 | HR 72 | Ht 64.0 in | Wt 148.8 lb

## 2015-08-10 DIAGNOSIS — R001 Bradycardia, unspecified: Secondary | ICD-10-CM | POA: Diagnosis not present

## 2015-08-10 DIAGNOSIS — Z7901 Long term (current) use of anticoagulants: Secondary | ICD-10-CM

## 2015-08-10 DIAGNOSIS — I5033 Acute on chronic diastolic (congestive) heart failure: Secondary | ICD-10-CM | POA: Diagnosis not present

## 2015-08-10 DIAGNOSIS — I10 Essential (primary) hypertension: Secondary | ICD-10-CM

## 2015-08-10 DIAGNOSIS — I2510 Atherosclerotic heart disease of native coronary artery without angina pectoris: Secondary | ICD-10-CM | POA: Diagnosis not present

## 2015-08-10 DIAGNOSIS — I4891 Unspecified atrial fibrillation: Secondary | ICD-10-CM

## 2015-08-10 DIAGNOSIS — I5032 Chronic diastolic (congestive) heart failure: Secondary | ICD-10-CM

## 2015-08-10 DIAGNOSIS — I480 Paroxysmal atrial fibrillation: Secondary | ICD-10-CM

## 2015-08-10 DIAGNOSIS — I2583 Coronary atherosclerosis due to lipid rich plaque: Secondary | ICD-10-CM

## 2015-08-10 LAB — COMPREHENSIVE METABOLIC PANEL
ALT: 19 U/L (ref 6–29)
AST: 25 U/L (ref 10–35)
Albumin: 3.7 g/dL (ref 3.6–5.1)
Alkaline Phosphatase: 50 U/L (ref 33–130)
BUN: 27 mg/dL — ABNORMAL HIGH (ref 7–25)
CO2: 25 mmol/L (ref 20–31)
Calcium: 9.5 mg/dL (ref 8.6–10.4)
Chloride: 96 mmol/L — ABNORMAL LOW (ref 98–110)
Creat: 1.53 mg/dL — ABNORMAL HIGH (ref 0.60–0.88)
Glucose, Bld: 95 mg/dL (ref 65–99)
Potassium: 4.4 mmol/L (ref 3.5–5.3)
Sodium: 131 mmol/L — ABNORMAL LOW (ref 135–146)
Total Bilirubin: 0.5 mg/dL (ref 0.2–1.2)
Total Protein: 6.7 g/dL (ref 6.1–8.1)

## 2015-08-10 LAB — CBC
HCT: 31.6 % — ABNORMAL LOW (ref 35.0–45.0)
Hemoglobin: 10.5 g/dL — ABNORMAL LOW (ref 11.7–15.5)
MCH: 31.2 pg (ref 27.0–33.0)
MCHC: 33.2 g/dL (ref 32.0–36.0)
MCV: 93.8 fL (ref 80.0–100.0)
MPV: 9.8 fL (ref 7.5–12.5)
Platelets: 314 10*3/uL (ref 140–400)
RBC: 3.37 MIL/uL — ABNORMAL LOW (ref 3.80–5.10)
RDW: 17.2 % — ABNORMAL HIGH (ref 11.0–15.0)
WBC: 6.3 10*3/uL (ref 3.8–10.8)

## 2015-08-10 MED ORDER — HYDRALAZINE HCL 50 MG PO TABS: 50.0000 mg | ORAL_TABLET | Freq: Three times a day (TID) | ORAL | Status: DC

## 2015-08-10 NOTE — Patient Instructions (Addendum)
Medication Instructions:  Your physician has recommended you make the following change in your medication:  1.  INCREASE the Hydralazine 50 mg taking 1 tablet 3 times a day   Labwork: TODAY:  CMP & CBC  Testing/Procedures: None ordered  Follow-Up: Your physician recommends that you schedule a follow-up appointment in: 4-6 [place]   Any Other Special Instructions Will Be Listed Below (If Applicable).     If you need a refill on your cardiac medications before your next appointment, please call your pharmacy.

## 2015-08-10 NOTE — Progress Notes (Addendum)
Cardiology Office Note   Date:  08/10/2015   ID:  Shannon [name], DOB [birth year], MRN [id number]  PCP:  Kirt Boys, DO  Cardiologist:  Dr. Delton See    Chief Complaint  Patient presents with  . Hospitalization Follow-up    no chest pain and no SOB.      History of Present Illness: Shannon [name] is a 82 y.o. female who presents for post hospitalization for dizziness in Junctional rhythm with HR 30.  She was admitted from the office and BB was stopped and amiodarone was decreased to 100 mg. She was on IV dopamine for awhile to keep her HR elevated.  It was titrated off and she has done well since.   She has a PMHx of HTN, h/o aflutter, CKD III, chronic diastolic CHF, T2DM, CVD, and carotid stenosis, also  cardiac catheterization in August 2012 at [facility] which showed 40% ostial LAD stenosis, normal EF, otherwise no significant CAD.  + hs SIADH.  Her HR improved form 30s to 50s and today now in the 70s.  No further lightheadedness or dizziness.   Today no chest pain or SOB. She has some lower ext edema. But wt is stable. Her BP is elevated, her hydralazine has been decreased but will now need to be increased.    Her TSH was elevated at 83- in hospital at one point TSH was low and synthroid was stopped.  She had opposite affect and now PCP titrating up her synthroid.      Past Medical History  Diagnosis Date  . Pneumonia   . Hypertension   . Small bowel obstruction (HCC)     "I don't know how many" (01/11/2015)  . Ventral hernia with bowel obstruction   . Asthma   . Hyperlipidemia   . Stroke (HCC)     "light one"  . COPD (chronic obstructive pulmonary disease) (HCC)   . Hypothyroidism   . Type II diabetes mellitus (HCC)   . Anemia     previous blood transfusions  . GERD (gastroesophageal reflux disease)   . History of stomach ulcers   . Arthritis     "all over"  . Renal insufficiency   . Depression     "light case"  . SIADH (syndrome of inappropriate  ADH production) (HCC)     [name] 01/10/2015  . Perforated gastric ulcer (HCC)   . Gastric stenosis   . Paraesophageal hernia   . Bradycardia     topped BB and amiodarone decreased- junctional rhythm     Past Surgical History  Procedure Laterality Date  . Tubal ligation    . Colectomy    . Hernia repair  2015  . Esophagogastroduodenoscopy N/A 01/19/2014    Procedure: ESOPHAGOGASTRODUODENOSCOPY (EGD);  Surgeon: Hilarie Fredrickson, MD;  Location: [facility];  Service: Endoscopy;  Laterality: N/A;  . Esophagogastroduodenoscopy N/A 01/20/2014    Procedure: ESOPHAGOGASTRODUODENOSCOPY (EGD);  Surgeon: Hilarie Fredrickson, MD;  Location: [facility];  Service: Endoscopy;  Laterality: N/A;  . Esophagogastroduodenoscopy N/A 03/19/2014    Procedure: ESOPHAGOGASTRODUODENOSCOPY (EGD);  Surgeon: Rachael Fee, MD;  Location: [facility];  Service: Endoscopy;  Laterality: N/A;  . Cholecystectomy open    . Ventral hernia repair  2015    incarcerated ventral hernia ([facility] 09/2013)/notes 01/10/2015  . Cataract extraction w/ intraocular lens  implant, bilateral    . Gastrojejunostomy      hx/notes 01/10/2015  . Laparotomy N/A 01/20/2015    Procedure: EXPLORATORY LAPAROTOMY;  Surgeon: Riley Lam  Magnus Ivan, MD;  Location: [facility] OR;  Service: General;  Laterality: N/A;  . Lysis of adhesion N/A 01/20/2015    Procedure: LYSIS OF ADHESIONS < 1 HOUR;  Surgeon: Abigail Miyamoto, MD;  Location: [facility] OR;  Service: General;  Laterality: N/A;  . Esophagogastroduodenoscopy N/A 07/08/2015    Procedure: ESOPHAGOGASTRODUODENOSCOPY (EGD);  Surgeon: Sherrilyn Rist, MD;  Location: [facility] END[facility]COPY;  Service: Endoscopy;  Laterality: N/A;     Current Outpatient Prescriptions  Medication Sig Dispense Refill  . acetaminophen (TYLENOL) 325 MG tablet Take 325 mg by mouth every 6 (six) hours as needed for mild pain or fever.     [name] ADVAIR DISKUS 250-50 MCG/DOSE AEPB INHALE 1 PUFF INTO THE LUNGS 2 (TWO) TIMES DAILY AS NEEDED FOR SHORTNESS OF BREATH   3  . albuterol (PROVENTIL HFA;VENTOLIN HFA) 108 (90 Base) MCG/ACT inhaler Inhale 2 puffs into the lungs every 6 (six) hours as needed for wheezing or shortness of breath. 1 Inhaler 2  . amiodarone (PACERONE) 100 MG tablet Take 1 tablet (100 mg total) by mouth daily. 30 tablet 1  . amLODipine (NORVASC) 10 MG tablet TAKE 1 TABLET BY MOUTH DAILY FOR HYPERTENSION 30 tablet 3  . apixaban (ELIQUIS) 2.5 MG TABS tablet Take 2.5 mg by mouth 2 (two) times daily.    [name] atorvastatin (LIPITOR) 10 MG tablet Take 10 mg by mouth daily.    [name] b complex vitamins tablet Take 1 tablet by mouth daily.     . calcium-vitamin D (OSCAL WITH D) 500-200 MG-UNIT per tablet Take 1 tablet by mouth daily with breakfast.     . chlorhexidine (PERIDEX) 0.12 % solution Use as directed 15 mLs in the mouth or throat 2 (two) times daily.    . [employer] GAS RELIEF 80 MG chewable tablet CHEW 1 TABLET BY MOUTH 4 TIMES A DAY AS NEEDED FOR FLATULENCE 30 tablet 0  . diclofenac sodium (VOLTAREN) 1 % GEL Apply 2 g topically daily as needed (for pain).     . feeding supplement (BOOST / RESOURCE BREEZE) LIQD Take 1 Container by mouth 3 (three) times daily between meals. 90 Container 0  . fluticasone (FLONASE) 50 MCG/ACT nasal spray Place 2 sprays into both nostrils daily. 16 g 3  . furosemide (LASIX) 20 MG tablet Take 1 tablet (20 mg total) by mouth daily. 30 tablet 5  . gabapentin (NEURONTIN) 300 MG capsule Take 2 caps po in AM and qhs, take 1 cap daily at NOON 150 capsule 6  . isosorbide mononitrate (IMDUR) 30 MG 24 hr tablet TAKE 1 TABLET (30 MG TOTAL) BY MOUTH DAILY.  2  . lactulose (CHRONULAC) 10 GM/15ML solution Take 15 mLs (10 g total) by mouth daily as needed for mild constipation. 240 mL 0  . latanoprost (XALATAN) 0.005 % ophthalmic solution Place 1 drop into both eyes at bedtime. 2.5 mL 12  . levothyroxine (SYNTHROID, LEVOTHROID) 50 MCG tablet Take 1 tablet (50 mcg total) by mouth daily before breakfast. 30 tablet 1  . Multiple  Vitamins-Minerals (MULTIVITAMIN WITH MINERALS) tablet Take 1 tablet by mouth every morning.     . pantoprazole (PROTONIX) 40 MG tablet TAKE 1 TABLET BY MOUTH TWICE A DAY 60 tablet 11  . Polyethyl Glycol-Propyl Glycol 0.4-0.3 % SOLN Place 1 drop into both eyes 4 (four) times daily.     . polyethylene glycol (MIRALAX / GLYCOLAX) packet Take 17 g by mouth 2 (two) times daily.     . RESTASIS 0.05 % ophthalmic emulsion USE  1 DROP INTO BOTH EYES TWICE DAILY 60 mL 1  . sennosides-docusate sodium (SENOKOT-S) 8.6-50 MG tablet Take 1 tablet by mouth 2 (two) times daily.     . sertraline (ZOLOFT) 100 MG tablet TAKE 1 TABLET BY MOUTH ONCE DAILY FOR DEPRESSION 30 tablet 5  . sodium chloride 1 g tablet TAKE 1 TABLET BY MOUTH THREE TIMES DAILY 90 tablet 2  . traMADol (ULTRAM) 50 MG tablet Take 50 mg by mouth 2 (two) times daily as needed for moderate pain.     . hydrALAZINE (APRESOLINE) 50 MG tablet Take 1 tablet (50 mg total) by mouth 3 (three) times daily. 270 tablet 3   No current facility-administered medications for this visit.    Allergies:   Review of patient's allergies indicates no known allergies.    Social History:  The patient  reports that she has never smoked. She has quit using smokeless tobacco. Her smokeless tobacco use included Snuff. She reports that she does not drink alcohol or use illicit drugs.   Family History:  The patient's family history includes Diabetes in her brother; Hypertension in her mother; Stroke in her mother. There is no history of Heart attack.    ROS:  General:no colds or fevers, no weight changes Skin:no rashes or ulcers HEENT:no blurred vision, no congestion CV:see HPI PUL:see HPI GI:no diarrhea constipation or melena, no indigestion GU:no hematuria, no dysuria MS:no joint pain, no claudication Neuro:no syncope, no lightheadedness Endo:no diabetes, + thyroid disease  Wt Readings from Last 3 Encounters:  08/10/15 148 lb 12.8 oz (67.495 kg)  08/06/15 147 lb  9.6 oz (66.951 kg)  08/05/15 149 lb 1.6 oz (67.631 kg)     PHYSICAL EXAM: VS:  BP 160/60 mmHg  Pulse 72  Ht 5\' 4"  (1.626 m)  Wt 148 lb 12.8 oz (67.495 kg)  BMI 25.53 kg/m2 , BMI Body mass index is 25.53 kg/(m^2). General:Pleasant affect, NAD Skin:Warm and dry, brisk capillary refill HEENT:normocephalic, sclera clear, mucus membranes moist Neck:supple, no JVD, no bruits  Heart:S1S2 RRR without murmur, gallup, rub or click Lungs:clear without rales, rhonchi, or wheezes KVQ:QVZD, non tender, + BS, do not palpate liver spleen or masses Ext:1+ lower ext edema, , 2+ radial pulses Neuro:alert and oriented X 3, MAE, follows commands, + facial symmetry    EKG:  EKG is ordered today. The ekg ordered today demonstrates SR rate of 71 no acute changes except improved HR SR. [name]    Recent Labs: 08/02/2015: B Natriuretic Peptide 731.4*; Magnesium 1.8; TSH 83.858* 08/10/2015: ALT 19; BUN 27*; Creat 1.53*; Hemoglobin 10.5*; Platelets 314; Potassium 4.4; Sodium 131*    Lipid Panel    Component Value Date/Time   CHOL 126 04/07/2015 1015   CHOL 110 12/24/2013 0456   TRIG 107 04/07/2015 1015   HDL 69 04/07/2015 1015   HDL 58 12/24/2013 0456   CHOLHDL 1.8 04/07/2015 1015   CHOLHDL 1.9 12/24/2013 0456   VLDL 26 12/24/2013 0456   LDLCALC 36 04/07/2015 1015   LDLCALC 26 12/24/2013 0456       Other studies Reviewed: Additional studies/ records that were reviewed today include: hospital notes, labs.EKGs   ASSESSMENT AND PLAN:  1.  Symptomatic junctional rhythm at 30 now resolved off BB and amiodarone is at 100 mg daily.  Now maintaining SR.  2. Hx PAF maintaining SR now  3. Chronic diastolic HF euvolemic except for mild lower ext edema.  Will check labs may be able to increase lasix prn.  4. HTN elevated  today.  Will increase hydralazine to 50 mg TID (previously was at 100 mg TID)  5. Acute on chronic ckd will check labs today  6. Hyponatremia hx of SIADH will check BMP today.   7.  Hypothyroidism now on synthroid followed per PCP.    8. Anticoagulation with Eliquis no bleeding.  Follow up with Dr. Delton See in 4-6 weeks.    Current medicines are reviewed with the patient today.  The patient Has no concerns regarding medicines.  The following changes have been made:  See above Labs/ tests ordered today include:see above  Disposition:   FU:  see above  Nyoka Lint, NP  08/10/2015 5:48 PM    [facility] [address] [place], [place], [place]  [address]/ [address] [place], [place] Phone: [phone]; Fax: [phone]  [phone]

## 2015-08-11 ENCOUNTER — Telehealth: Payer: Self-pay | Admitting: Cardiology

## 2015-08-11 ENCOUNTER — Other Ambulatory Visit: Payer: Self-pay

## 2015-08-11 MED ORDER — APIXABAN 2.5 MG PO TABS
2.5000 mg | ORAL_TABLET | Freq: Two times a day (BID) | ORAL | Status: DC
Start: 2015-08-11 — End: 2016-02-02

## 2015-08-11 NOTE — Telephone Encounter (Signed)
Pt and daughter, [name], [place] on file, have been made aware of pts lab results. They verbalized understanding.

## 2015-08-11 NOTE — Telephone Encounter (Signed)
New message     Patient returning nurse call - test results

## 2015-08-17 ENCOUNTER — Encounter: Payer: Self-pay | Admitting: Podiatry

## 2015-08-17 ENCOUNTER — Ambulatory Visit (INDEPENDENT_AMBULATORY_CARE_PROVIDER_SITE_OTHER): Payer: Medicare Other | Admitting: Podiatry

## 2015-08-17 ENCOUNTER — Ambulatory Visit (INDEPENDENT_AMBULATORY_CARE_PROVIDER_SITE_OTHER): Payer: Medicare Other

## 2015-08-17 DIAGNOSIS — E11621 Type 2 diabetes mellitus with foot ulcer: Secondary | ICD-10-CM

## 2015-08-17 DIAGNOSIS — B351 Tinea unguium: Secondary | ICD-10-CM | POA: Diagnosis not present

## 2015-08-17 DIAGNOSIS — M79676 Pain in unspecified toe(s): Secondary | ICD-10-CM

## 2015-08-17 DIAGNOSIS — E1149 Type 2 diabetes mellitus with other diabetic neurological complication: Secondary | ICD-10-CM

## 2015-08-17 DIAGNOSIS — L97509 Non-pressure chronic ulcer of other part of unspecified foot with unspecified severity: Secondary | ICD-10-CM | POA: Diagnosis not present

## 2015-08-17 MED ORDER — DOXYCYCLINE HYCLATE 100 MG PO TABS: 100.0000 mg | ORAL_TABLET | Freq: Two times a day (BID) | ORAL | Status: DC

## 2015-08-17 NOTE — Progress Notes (Signed)
Subjective:     Patient ID: Shannon [name], female   DOB: 1923/05/06, 82 y.o.   MRN: [id number]  HPI this patient presents the office for preventative foot care services. She says her nails have grown long and thick and are painful walking and wearing her shoes. She is also concerned about the third toe on the right foot. She says that the total toe black since  07/23/2015.  She admits that she has not been on antibiotics since that time. She has been seen by her medical doctor who has made an appointment for her to see wound care on May 26 this month.  Meanwhile she presents the office today for preventative foot care services and I feel I need to treat the third toe at this time. She does present with a bandage with significant drainage noted on the tip of the third toe, right foot. She also says that she has pain noted on the inside of the big toe of the right foot. She presents the office for an evaluation and treatment of this condition.  Patient has diabetic angiopathy.   Review of Systems     Objective:   Physical Exam GENERAL APPEARANCE: Alert, conversant. Appropriately groomed. No acute distress.  VASCULAR: Pedal pulses are not   palpable at  [facility] and PT bilateral.  Capillary refill time is immediate to all digits,  Cold feet noted. NEUROLOGIC: sensation is diminished l to 5.07 monofilament at 5/5 sites bilateral.  Light touch is intact bilateral, Muscle strength normal.  MUSCULOSKELETAL: acceptable muscle strength, tone and stability bilateral.  Intrinsic muscluature intact bilateral.  HAV 1st MPJ with hammer toes B/L.   DERMATOLOGIC: skin color, texture, and turgor are within normal limits. There is a non infected ulcerated lesion on the lateral aspect great toe right foot. There is necrotic tissue on the distal aspect third toe right foot.  The third toe is blackened and swollen.  There is drainage from the distal aspect third toe right foot.   NAILS  Thick disfigured discolored nails both  feet. There is no nail present on third toe right foot.      Assessment:     Onychomycosis B/L   Diabetic ulcer third toe right  Cellulitis 3rd toe right foot.    Plan:     Debride nails B/L.  Debride necrotic tissue distal aspect 3rd toe right foot.  Neosporin/DSD.  Xrays taken revealing no bone infection at this time.  Prescribe doxycycline.  Home instructions for soaks and bandaging were given.  RTC 1 week.  Told her she may still desire to be seen at the wound care center as recommended by her medical doctor.    Gardiner Barefoot DPM

## 2015-08-18 ENCOUNTER — Other Ambulatory Visit: Payer: Medicare Other

## 2015-08-23 DIAGNOSIS — I10 Essential (primary) hypertension: Secondary | ICD-10-CM | POA: Diagnosis not present

## 2015-08-23 DIAGNOSIS — Z961 Presence of intraocular lens: Secondary | ICD-10-CM | POA: Diagnosis not present

## 2015-08-23 DIAGNOSIS — H401130 Primary open-angle glaucoma, bilateral, stage unspecified: Secondary | ICD-10-CM | POA: Diagnosis not present

## 2015-08-23 DIAGNOSIS — H527 Unspecified disorder of refraction: Secondary | ICD-10-CM | POA: Diagnosis not present

## 2015-08-24 ENCOUNTER — Encounter: Payer: Self-pay | Admitting: Podiatry

## 2015-08-24 ENCOUNTER — Ambulatory Visit (INDEPENDENT_AMBULATORY_CARE_PROVIDER_SITE_OTHER): Payer: Medicare Other | Admitting: Podiatry

## 2015-08-24 VITALS — BP 142/68 | HR 72 | Resp 12

## 2015-08-24 DIAGNOSIS — E11621 Type 2 diabetes mellitus with foot ulcer: Secondary | ICD-10-CM | POA: Diagnosis not present

## 2015-08-24 DIAGNOSIS — L97509 Non-pressure chronic ulcer of other part of unspecified foot with unspecified severity: Principal | ICD-10-CM

## 2015-08-24 DIAGNOSIS — E1149 Type 2 diabetes mellitus with other diabetic neurological complication: Secondary | ICD-10-CM

## 2015-08-24 DIAGNOSIS — L89891 Pressure ulcer of other site, stage 1: Secondary | ICD-10-CM

## 2015-08-24 DIAGNOSIS — R0989 Other specified symptoms and signs involving the circulatory and respiratory systems: Secondary | ICD-10-CM

## 2015-08-24 NOTE — Progress Notes (Signed)
Subjective:     Patient ID: Shannon [name], female   DOB: 12/08/1923, 82 y.o.   MRN: [id number]  HPI this patient returns to the office 1 week after diagnosing a diabetic ulcer on the tip of the third toe, right foot. She was also found to have a small ulcer beginning on the lateral aspect of the right big toe.  She was prescribed doxycycline and told to perform home soaks and t bandage her foot after the soaking.  She presents the office today stating that there is no drainage from the tip of the third toe of the right foot. She does state that she has been soaking and placing  Neosporin in the first interspace in an effort to help promote healing to the diabetic ulcer right hallux.  She presents the office today for continued follow-up an evaluation of her diabetic infected  Ulcer  third toe, right foot   Review of Systems     Objective:   Physical Exam GENERAL APPEARANCE: Alert, conversant. Appropriately groomed. No acute distress.  VASCULAR: Pedal pulses are  palpable at  Not  DP and PT bilateral.  Capillary refill time is immediate to all digits,  Cold feet noted.  NEUROLOGIC: sensation is diminished  to 5.07 monofilament at 5/5 sites bilateral.  Light touch is intact bilateral, Muscle strength normal.  MUSCULOSKELETAL: acceptable muscle strength, tone and stability bilateral.  Intrinsic muscluature intact bilateral.  HAV 1st MPJ with hammer toes B/L.   DERMATOLOGIC: the third toe right foot continues to be black but the swelling and increased temperature has diminished.  There is no drainage from the distal aspect third toe right foot. There is continued necrotic ulcer lateral aspect right hallux but an additional ulcer has developed medial base second toe right foot.  There is minimal drainage from the first interspace region.        Assessment:     Diabetic ulcer 1,2,3 right foot.  Resolved cellulitis third toe.     Plan:     ROV  Debridement of ulcer 1st  And second toes right foot.   Third toe has improved and the infection is resolving.  The blackened discoloration third toe persists.  Silvadene/DSD applied.   Continue home soaks and bandaging.   Told her daughter that an evaluation by wound center would be appropriate.  RTC prn  .   Gardiner Barefoot DPM

## 2015-08-26 ENCOUNTER — Emergency Department (HOSPITAL_COMMUNITY): Payer: Medicare Other

## 2015-08-26 ENCOUNTER — Encounter (HOSPITAL_COMMUNITY): Payer: Self-pay | Admitting: *Deleted

## 2015-08-26 ENCOUNTER — Inpatient Hospital Stay (HOSPITAL_COMMUNITY)
Admission: EM | Admit: 2015-08-26 | Discharge: 2015-09-05 | DRG: 381 | Disposition: A | Discharge status: Home / Self Care | Payer: Medicare Other | Attending: Internal Medicine | Admitting: Internal Medicine

## 2015-08-26 ENCOUNTER — Other Ambulatory Visit: Payer: Self-pay | Admitting: *Deleted

## 2015-08-26 DIAGNOSIS — E1165 Type 2 diabetes mellitus with hyperglycemia: Secondary | ICD-10-CM | POA: Diagnosis not present

## 2015-08-26 DIAGNOSIS — K311 Adult hypertrophic pyloric stenosis: Principal | ICD-10-CM | POA: Diagnosis present

## 2015-08-26 DIAGNOSIS — R112 Nausea with vomiting, unspecified: Secondary | ICD-10-CM

## 2015-08-26 DIAGNOSIS — I5032 Chronic diastolic (congestive) heart failure: Secondary | ICD-10-CM | POA: Diagnosis present

## 2015-08-26 DIAGNOSIS — N179 Acute kidney failure, unspecified: Secondary | ICD-10-CM | POA: Diagnosis present

## 2015-08-26 DIAGNOSIS — I4891 Unspecified atrial fibrillation: Secondary | ICD-10-CM | POA: Diagnosis not present

## 2015-08-26 DIAGNOSIS — E1122 Type 2 diabetes mellitus with diabetic chronic kidney disease: Secondary | ICD-10-CM | POA: Diagnosis present

## 2015-08-26 DIAGNOSIS — Z7901 Long term (current) use of anticoagulants: Secondary | ICD-10-CM

## 2015-08-26 DIAGNOSIS — R34 Anuria and oliguria: Secondary | ICD-10-CM | POA: Diagnosis not present

## 2015-08-26 DIAGNOSIS — E1169 Type 2 diabetes mellitus with other specified complication: Secondary | ICD-10-CM | POA: Diagnosis not present

## 2015-08-26 DIAGNOSIS — I480 Paroxysmal atrial fibrillation: Secondary | ICD-10-CM | POA: Diagnosis present

## 2015-08-26 DIAGNOSIS — Z515 Encounter for palliative care: Secondary | ICD-10-CM | POA: Diagnosis not present

## 2015-08-26 DIAGNOSIS — E11621 Type 2 diabetes mellitus with foot ulcer: Secondary | ICD-10-CM | POA: Diagnosis not present

## 2015-08-26 DIAGNOSIS — R001 Bradycardia, unspecified: Secondary | ICD-10-CM | POA: Diagnosis present

## 2015-08-26 DIAGNOSIS — M7731 Calcaneal spur, right foot: Secondary | ICD-10-CM | POA: Diagnosis not present

## 2015-08-26 DIAGNOSIS — G2401 Drug induced subacute dyskinesia: Secondary | ICD-10-CM | POA: Diagnosis present

## 2015-08-26 DIAGNOSIS — M868X7 Other osteomyelitis, ankle and foot: Secondary | ICD-10-CM | POA: Diagnosis present

## 2015-08-26 DIAGNOSIS — J9811 Atelectasis: Secondary | ICD-10-CM | POA: Diagnosis not present

## 2015-08-26 DIAGNOSIS — K219 Gastro-esophageal reflux disease without esophagitis: Secondary | ICD-10-CM | POA: Diagnosis present

## 2015-08-26 DIAGNOSIS — E039 Hypothyroidism, unspecified: Secondary | ICD-10-CM | POA: Diagnosis present

## 2015-08-26 DIAGNOSIS — I96 Gangrene, not elsewhere classified: Secondary | ICD-10-CM | POA: Diagnosis not present

## 2015-08-26 DIAGNOSIS — E785 Hyperlipidemia, unspecified: Secondary | ICD-10-CM | POA: Diagnosis present

## 2015-08-26 DIAGNOSIS — L97509 Non-pressure chronic ulcer of other part of unspecified foot with unspecified severity: Secondary | ICD-10-CM | POA: Diagnosis present

## 2015-08-26 DIAGNOSIS — I130 Hypertensive heart and chronic kidney disease with heart failure and stage 1 through stage 4 chronic kidney disease, or unspecified chronic kidney disease: Secondary | ICD-10-CM | POA: Diagnosis present

## 2015-08-26 DIAGNOSIS — F4322 Adjustment disorder with anxiety: Secondary | ICD-10-CM | POA: Diagnosis not present

## 2015-08-26 DIAGNOSIS — K2970 Gastritis, unspecified, without bleeding: Secondary | ICD-10-CM | POA: Diagnosis not present

## 2015-08-26 DIAGNOSIS — R1110 Vomiting, unspecified: Secondary | ICD-10-CM | POA: Diagnosis not present

## 2015-08-26 DIAGNOSIS — D649 Anemia, unspecified: Secondary | ICD-10-CM | POA: Diagnosis present

## 2015-08-26 DIAGNOSIS — K529 Noninfective gastroenteritis and colitis, unspecified: Secondary | ICD-10-CM | POA: Diagnosis not present

## 2015-08-26 DIAGNOSIS — R1111 Vomiting without nausea: Secondary | ICD-10-CM | POA: Diagnosis not present

## 2015-08-26 DIAGNOSIS — Z8673 Personal history of transient ischemic attack (TIA), and cerebral infarction without residual deficits: Secondary | ICD-10-CM

## 2015-08-26 DIAGNOSIS — E1152 Type 2 diabetes mellitus with diabetic peripheral angiopathy with gangrene: Secondary | ICD-10-CM | POA: Diagnosis present

## 2015-08-26 DIAGNOSIS — K5730 Diverticulosis of large intestine without perforation or abscess without bleeding: Secondary | ICD-10-CM | POA: Diagnosis not present

## 2015-08-26 DIAGNOSIS — J449 Chronic obstructive pulmonary disease, unspecified: Secondary | ICD-10-CM | POA: Diagnosis present

## 2015-08-26 DIAGNOSIS — K5732 Diverticulitis of large intestine without perforation or abscess without bleeding: Secondary | ICD-10-CM | POA: Diagnosis present

## 2015-08-26 DIAGNOSIS — E1143 Type 2 diabetes mellitus with diabetic autonomic (poly)neuropathy: Secondary | ICD-10-CM | POA: Diagnosis present

## 2015-08-26 DIAGNOSIS — L97513 Non-pressure chronic ulcer of other part of right foot with necrosis of muscle: Secondary | ICD-10-CM

## 2015-08-26 DIAGNOSIS — A09 Infectious gastroenteritis and colitis, unspecified: Secondary | ICD-10-CM | POA: Diagnosis present

## 2015-08-26 DIAGNOSIS — N189 Chronic kidney disease, unspecified: Secondary | ICD-10-CM

## 2015-08-26 DIAGNOSIS — K3184 Gastroparesis: Secondary | ICD-10-CM | POA: Diagnosis present

## 2015-08-26 DIAGNOSIS — E1151 Type 2 diabetes mellitus with diabetic peripheral angiopathy without gangrene: Secondary | ICD-10-CM | POA: Diagnosis not present

## 2015-08-26 DIAGNOSIS — Z79899 Other long term (current) drug therapy: Secondary | ICD-10-CM

## 2015-08-26 DIAGNOSIS — Z7951 Long term (current) use of inhaled steroids: Secondary | ICD-10-CM

## 2015-08-26 DIAGNOSIS — I252 Old myocardial infarction: Secondary | ICD-10-CM

## 2015-08-26 DIAGNOSIS — E119 Type 2 diabetes mellitus without complications: Secondary | ICD-10-CM

## 2015-08-26 DIAGNOSIS — N183 Chronic kidney disease, stage 3 (moderate): Secondary | ICD-10-CM | POA: Diagnosis not present

## 2015-08-26 DIAGNOSIS — Z66 Do not resuscitate: Secondary | ICD-10-CM | POA: Diagnosis present

## 2015-08-26 DIAGNOSIS — N184 Chronic kidney disease, stage 4 (severe): Secondary | ICD-10-CM | POA: Diagnosis present

## 2015-08-26 DIAGNOSIS — M869 Osteomyelitis, unspecified: Secondary | ICD-10-CM

## 2015-08-26 DIAGNOSIS — T182XXA Foreign body in stomach, initial encounter: Secondary | ICD-10-CM | POA: Diagnosis present

## 2015-08-26 DIAGNOSIS — I739 Peripheral vascular disease, unspecified: Secondary | ICD-10-CM

## 2015-08-26 DIAGNOSIS — L97511 Non-pressure chronic ulcer of other part of right foot limited to breakdown of skin: Secondary | ICD-10-CM | POA: Diagnosis not present

## 2015-08-26 DIAGNOSIS — I10 Essential (primary) hypertension: Secondary | ICD-10-CM | POA: Diagnosis not present

## 2015-08-26 DIAGNOSIS — R600 Localized edema: Secondary | ICD-10-CM | POA: Diagnosis not present

## 2015-08-26 DIAGNOSIS — K5900 Constipation, unspecified: Secondary | ICD-10-CM | POA: Diagnosis not present

## 2015-08-26 DIAGNOSIS — Z7189 Other specified counseling: Secondary | ICD-10-CM | POA: Diagnosis not present

## 2015-08-26 LAB — URINALYSIS, ROUTINE W REFLEX MICROSCOPIC
Bilirubin Urine: NEGATIVE
Glucose, UA: NEGATIVE mg/dL
Hgb urine dipstick: NEGATIVE
Ketones, ur: 15 mg/dL — AB
Leukocytes, UA: NEGATIVE
Nitrite: NEGATIVE
Protein, ur: 100 mg/dL — AB
Specific Gravity, Urine: 1.022 (ref 1.005–1.030)
pH: 6.5 (ref 5.0–8.0)

## 2015-08-26 LAB — CBC
HCT: 32.2 % — ABNORMAL LOW (ref 36.0–46.0)
Hemoglobin: 10.8 g/dL — ABNORMAL LOW (ref 12.0–15.0)
MCH: 31.3 pg (ref 26.0–34.0)
MCHC: 33.5 g/dL (ref 30.0–36.0)
MCV: 93.3 fL (ref 78.0–100.0)
Platelets: 335 10*3/uL (ref 150–400)
RBC: 3.45 MIL/uL — ABNORMAL LOW (ref 3.87–5.11)
RDW: 17.5 % — ABNORMAL HIGH (ref 11.5–15.5)
WBC: 13.7 10*3/uL — ABNORMAL HIGH (ref 4.0–10.5)

## 2015-08-26 LAB — COMPREHENSIVE METABOLIC PANEL
ALT: 27 U/L (ref 14–54)
AST: 37 U/L (ref 15–41)
Albumin: 3.4 g/dL — ABNORMAL LOW (ref 3.5–5.0)
Alkaline Phosphatase: 118 U/L (ref 38–126)
Anion gap: 10 (ref 5–15)
BUN: 17 mg/dL (ref 6–20)
CO2: 24 mmol/L (ref 22–32)
Calcium: 9.9 mg/dL (ref 8.9–10.3)
Chloride: 105 mmol/L (ref 101–111)
Creatinine, Ser: 1.99 mg/dL — ABNORMAL HIGH (ref 0.44–1.00)
GFR calc Af Amer: 24 mL/min — ABNORMAL LOW (ref >60)
GFR calc non Af Amer: 21 mL/min — ABNORMAL LOW (ref >60)
Glucose, Bld: 67 mg/dL (ref 65–99)
Potassium: 3.9 mmol/L (ref 3.5–5.1)
Sodium: 139 mmol/L (ref 135–145)
Total Bilirubin: 0.5 mg/dL (ref 0.3–1.2)
Total Protein: 6.6 g/dL (ref 6.5–8.1)

## 2015-08-26 LAB — HEPARIN LEVEL (UNFRACTIONATED): Heparin Unfractionated: 0.8 IU/mL — ABNORMAL HIGH (ref 0.30–0.70)

## 2015-08-26 LAB — I-STAT TROPONIN, ED: Troponin i, poc: 0.02 ng/mL (ref 0.00–0.08)

## 2015-08-26 LAB — LIPASE, BLOOD: Lipase: 18 U/L (ref 11–51)

## 2015-08-26 LAB — APTT: aPTT: 36 seconds (ref 24–37)

## 2015-08-26 LAB — URINE MICROSCOPIC-ADD ON: Squamous Epithelial / LPF: NONE SEEN

## 2015-08-26 LAB — TSH: TSH: 84.851 u[IU]/mL — ABNORMAL HIGH (ref 0.350–4.500)

## 2015-08-26 LAB — BRAIN NATRIURETIC PEPTIDE: B Natriuretic Peptide: 39 pg/mL (ref 0.0–100.0)

## 2015-08-26 MED ORDER — AMIODARONE HCL IN DEXTROSE 360-4.14 MG/200ML-% IV SOLN: 30.0000 mg/h | INTRAVENOUS | Status: DC

## 2015-08-26 MED ORDER — METRONIDAZOLE IN NACL 5-0.79 MG/ML-% IV SOLN: 500.0000 mg | Freq: Three times a day (TID) | INTRAVENOUS | Status: DC

## 2015-08-26 MED ORDER — PANTOPRAZOLE SODIUM 40 MG IV SOLR: 40.0000 mg | INTRAVENOUS | Status: DC

## 2015-08-26 MED ORDER — LEVOTHYROXINE SODIUM 100 MCG IV SOLR
25.0000 ug | Freq: Every day | INTRAVENOUS | Status: DC
  Administered 2015-08-26 – 2015-08-27 (×2): 25 ug via INTRAVENOUS
  Filled 2015-08-26 (×2): qty 5

## 2015-08-26 MED ORDER — AMIODARONE HCL IN DEXTROSE 360-4.14 MG/200ML-% IV SOLN: 60.0000 mg/h | INTRAVENOUS | Status: DC

## 2015-08-26 MED ORDER — CIPROFLOXACIN IN D5W 400 MG/200ML IV SOLN
400.0000 mg | Freq: Once | INTRAVENOUS | Status: AC
  Administered 2015-08-26: 400 mg via INTRAVENOUS
  Filled 2015-08-26: qty 200

## 2015-08-26 MED ORDER — ONDANSETRON HCL 4 MG/2ML IJ SOLN
4.0000 mg | Freq: Four times a day (QID) | INTRAMUSCULAR | Status: DC | PRN
  Administered 2015-08-26 – 2015-09-03 (×4): 4 mg via INTRAVENOUS
  Filled 2015-08-26 (×5): qty 2

## 2015-08-26 MED ORDER — HYDRALAZINE HCL 20 MG/ML IJ SOLN
10.0000 mg | Freq: Four times a day (QID) | INTRAMUSCULAR | Status: DC | PRN
  Administered 2015-08-29: 10 mg via INTRAVENOUS
  Filled 2015-08-26: qty 1

## 2015-08-26 MED ORDER — MOMETASONE FURO-FORMOTEROL FUM 200-5 MCG/ACT IN AERO: 2.0000 | INHALATION_SPRAY | Freq: Two times a day (BID) | RESPIRATORY_TRACT | Status: DC

## 2015-08-26 MED ORDER — ENOXAPARIN SODIUM 40 MG/0.4ML ~~LOC~~ SOLN: 40.0000 mg | SUBCUTANEOUS | Status: DC

## 2015-08-26 MED ORDER — FLUTICASONE PROPIONATE 50 MCG/ACT NA SUSP
2.0000 | Freq: Every day | NASAL | Status: DC
  Administered 2015-08-26 – 2015-09-05 (×11): 2 via NASAL
  Filled 2015-08-26: qty 16

## 2015-08-26 MED ORDER — METRONIDAZOLE IN NACL 5-0.79 MG/ML-% IV SOLN
500.0000 mg | Freq: Three times a day (TID) | INTRAVENOUS | Status: DC
  Administered 2015-08-26 – 2015-09-01 (×17): 500 mg via INTRAVENOUS
  Filled 2015-08-26 (×18): qty 100

## 2015-08-26 MED ORDER — METRONIDAZOLE IN NACL 5-0.79 MG/ML-% IV SOLN
500.0000 mg | Freq: Once | INTRAVENOUS | Status: AC
  Administered 2015-08-26: 500 mg via INTRAVENOUS
  Filled 2015-08-26: qty 100

## 2015-08-26 MED ORDER — CIPROFLOXACIN IN D5W 400 MG/200ML IV SOLN: 400.0000 mg | Freq: Two times a day (BID) | INTRAVENOUS | Status: DC

## 2015-08-26 MED ORDER — ONDANSETRON HCL 4 MG/2ML IJ SOLN
4.0000 mg | Freq: Once | INTRAMUSCULAR | Status: AC
  Administered 2015-08-26: 4 mg via INTRAVENOUS
  Filled 2015-08-26: qty 2

## 2015-08-26 MED ORDER — ACETAMINOPHEN 325 MG PO TABS
650.0000 mg | ORAL_TABLET | Freq: Four times a day (QID) | ORAL | Status: DC | PRN
  Administered 2015-08-29 (×2): 650 mg via ORAL
  Filled 2015-08-26 (×2): qty 2

## 2015-08-26 MED ORDER — SERTRALINE HCL 100 MG PO TABS: ORAL_TABLET | ORAL | Status: DC

## 2015-08-26 MED ORDER — BISACODYL 10 MG RE SUPP: 10.0000 mg | Freq: Every day | RECTAL | Status: DC | PRN

## 2015-08-26 MED ORDER — SODIUM CHLORIDE 0.9 % IV SOLN
INTRAVENOUS | Status: DC
  Administered 2015-08-26 – 2015-08-28 (×4): via INTRAVENOUS

## 2015-08-26 MED ORDER — ONDANSETRON HCL 4 MG PO TABS: 4.0000 mg | ORAL_TABLET | Freq: Four times a day (QID) | ORAL | Status: DC | PRN

## 2015-08-26 MED ORDER — IPRATROPIUM-ALBUTEROL 0.5-2.5 (3) MG/3ML IN SOLN
3.0000 mL | RESPIRATORY_TRACT | Status: DC
  Administered 2015-08-26 (×2): 3 mL via RESPIRATORY_TRACT
  Filled 2015-08-26 (×2): qty 3

## 2015-08-26 MED ORDER — SODIUM CHLORIDE 0.9 % IV BOLUS (SEPSIS)
500.0000 mL | Freq: Once | INTRAVENOUS | Status: AC
  Administered 2015-08-26: 500 mL via INTRAVENOUS

## 2015-08-26 MED ORDER — CIPROFLOXACIN IN D5W 400 MG/200ML IV SOLN
400.0000 mg | INTRAVENOUS | Status: DC
  Administered 2015-08-27: 400 mg via INTRAVENOUS
  Filled 2015-08-26: qty 200

## 2015-08-26 MED ORDER — ATORVASTATIN CALCIUM 10 MG PO TABS: ORAL_TABLET | ORAL | Status: DC

## 2015-08-26 MED ORDER — ACETAMINOPHEN 650 MG RE SUPP: 650.0000 mg | Freq: Four times a day (QID) | RECTAL | Status: DC | PRN

## 2015-08-26 MED ORDER — METOCLOPRAMIDE HCL 5 MG/ML IJ SOLN
10.0000 mg | INTRAMUSCULAR | Status: AC
  Administered 2015-08-26: 10 mg via INTRAVENOUS
  Filled 2015-08-26: qty 2

## 2015-08-26 MED ORDER — PANTOPRAZOLE SODIUM 40 MG IV SOLR
40.0000 mg | INTRAVENOUS | Status: DC
  Administered 2015-08-26 – 2015-08-31 (×5): 40 mg via INTRAVENOUS
  Filled 2015-08-26 (×6): qty 40

## 2015-08-26 MED ORDER — HEPARIN (PORCINE) IN NACL 100-0.45 UNIT/ML-% IJ SOLN
950.0000 [IU]/h | INTRAMUSCULAR | Status: DC
  Administered 2015-08-26: 950 [IU]/h via INTRAVENOUS
  Filled 2015-08-26: qty 250

## 2015-08-26 NOTE — ED Notes (Signed)
Contacted CT regarding delay of scan - pt next for scan.

## 2015-08-26 NOTE — ED Provider Notes (Signed)
CSN: [id number]     Arrival date & time 08/26/15  0610 History   First MD Initiated Contact with Patient 08/26/15 [phone]     Chief Complaint  Patient presents with  . Abdominal Pain     (Consider location/radiation/quality/duration/timing/severity/associated sxs/prior Treatment) Patient is a 82 y.o. female presenting with abdominal pain.  Abdominal Pain Associated symptoms: nausea, shortness of breath and vomiting      82 year old female with past medical history of hypertension, hyperlipidemia, stroke, COPD, hypothyroidism, diabetes, anemia, SIADH, bradycardia, gastric stenosis, gastric ulcers, multiple small bowel obstructions, presenting to the ED for abdominal pain. Patient reports this is been ongoing for the past few days which she felt was due to gas however was not improving. She reports yesterday evening around 8 PM she developed some nausea and vomiting. This persisted throughout the night. States she has bowel movements but they are very small and atypical for her.  She does report some abdominal bloating.  Denies fever, chills, sweats.  Patient has had multiple abdominal surgeries including colectomy, open cholecystectomy, tubal ligation, ventral hernia repair due to incarcerated hernia, gastrojejunostomy, lysis of adhesions.  Patient also complains of SOB.  States this has been ongoing since she was in the hospital earlier this month for bradycardia.  States she has followed up with her cardiologist since she was in the hospital.  Denies cough, fever, or significant weight gain from her baseline.  She has been taking her medications as directed.  Past Medical History  Diagnosis Date  . Pneumonia   . Hypertension   . Small bowel obstruction ([place])     "I don't know how many" (01/11/2015)  . Ventral hernia with bowel obstruction   . Asthma   . Hyperlipidemia   . Stroke ([place])     "light one"  . COPD (chronic obstructive pulmonary disease) ([place])   . Hypothyroidism   . Type II  diabetes mellitus ([place])   . Anemia     previous blood transfusions  . GERD (gastroesophageal reflux disease)   . History of stomach ulcers   . Arthritis     "all over"  . Renal insufficiency   . Depression     "light case"  . SIADH (syndrome of inappropriate ADH production) ([place])     [name] 01/10/2015  . Perforated gastric ulcer ([place])   . Gastric stenosis   . Paraesophageal hernia   . Bradycardia     topped BB and amiodarone decreased- junctional rhythm    Past Surgical History  Procedure Laterality Date  . Tubal ligation    . Colectomy    . Hernia repair  2015  . Esophagogastroduodenoscopy N/A 01/19/2014    Procedure: ESOPHAGOGASTRODUODENOSCOPY (EGD);  Surgeon: Irene Shipper, MD;  Location: [facility];  Service: Endoscopy;  Laterality: N/A;  . Esophagogastroduodenoscopy N/A 01/20/2014    Procedure: ESOPHAGOGASTRODUODENOSCOPY (EGD);  Surgeon: Irene Shipper, MD;  Location: [facility];  Service: Endoscopy;  Laterality: N/A;  . Esophagogastroduodenoscopy N/A 03/19/2014    Procedure: ESOPHAGOGASTRODUODENOSCOPY (EGD);  Surgeon: Milus Banister, MD;  Location: [facility];  Service: Endoscopy;  Laterality: N/A;  . Cholecystectomy open    . Ventral hernia repair  2015    incarcerated ventral hernia ([facility] 09/2013)/notes 01/10/2015  . Cataract extraction w/ intraocular lens  implant, bilateral    . Gastrojejunostomy      hx/notes 01/10/2015  . Laparotomy N/A 01/20/2015    Procedure: EXPLORATORY LAPAROTOMY;  Surgeon: Coralie Keens, MD;  Location: [facility];  Service: General;  Laterality:  N/A;  . Lysis of adhesion N/A 01/20/2015    Procedure: LYSIS OF ADHESIONS < 1 HOUR;  Surgeon: Coralie Keens, MD;  Location: [place];  Service: General;  Laterality: N/A;  . Esophagogastroduodenoscopy N/A 07/08/2015    Procedure: ESOPHAGOGASTRODUODENOSCOPY (EGD);  Surgeon: Doran Stabler, MD;  Location: [facility]COPY;  Service: Endoscopy;  Laterality: N/A;   Family History  Problem Relation Age of  Onset  . Stroke Mother   . Heart attack Neg Hx   . Hypertension Mother   . Diabetes Brother    Social History  Substance Use Topics  . Smoking status: Never Smoker   . Smokeless tobacco: Former Systems developer    Types: Snuff  . Alcohol Use: No   OB History    No data available     Review of Systems  Respiratory: Positive for shortness of breath.   Gastrointestinal: Positive for nausea, vomiting and abdominal pain.  All other systems reviewed and are negative.     Allergies  Review of patient's allergies indicates no known allergies.  Home Medications   Prior to Admission medications   Medication Sig Start Date End Date Taking? Authorizing Provider  acetaminophen (TYLENOL) 325 MG tablet Take 325 mg by mouth every 6 (six) hours as needed for mild pain or fever.     Historical Provider, MD  ADVAIR DISKUS 250-50 MCG/DOSE AEPB INHALE 1 PUFF INTO THE LUNGS 2 (TWO) TIMES DAILY AS NEEDED FOR SHORTNESS OF BREATH 12/01/14   Historical Provider, MD  albuterol (PROVENTIL HFA;VENTOLIN HFA) 108 (90 Base) MCG/ACT inhaler Inhale 2 puffs into the lungs every 6 (six) hours as needed for wheezing or shortness of breath. 06/04/15   Gildardo Cranker, DO  amiodarone (PACERONE) 100 MG tablet Take 1 tablet (100 mg total) by mouth daily. 08/05/15   Florinda Marker, MD  amLODipine (NORVASC) 10 MG tablet TAKE 1 TABLET BY MOUTH DAILY FOR HYPERTENSION 08/09/15   Gildardo Cranker, DO  apixaban (ELIQUIS) 2.5 MG TABS tablet Take 1 tablet (2.5 mg total) by mouth 2 (two) times daily. 08/11/15   Gildardo Cranker, DO  atorvastatin (LIPITOR) 10 MG tablet Take 10 mg by mouth daily.    Historical Provider, MD  b complex vitamins tablet Take 1 tablet by mouth daily.     Historical Provider, MD  calcium-vitamin D (OSCAL WITH D) 500-200 MG-UNIT per tablet Take 1 tablet by mouth daily with breakfast.     Historical Provider, MD  chlorhexidine (PERIDEX) 0.12 % solution Use as directed 15 mLs in the mouth or throat 2 (two) times daily.     Historical Provider, MD  [facility] GAS RELIEF 80 MG chewable tablet CHEW 1 TABLET BY MOUTH 4 TIMES A DAY AS NEEDED FOR FLATULENCE 06/28/15   Gildardo Cranker, DO  diclofenac sodium (VOLTAREN) 1 % GEL Apply 2 g topically daily as needed (for pain).     Historical Provider, MD  doxycycline (VIBRA-TABS) 100 MG tablet Take 1 tablet (100 mg total) by mouth 2 (two) times daily. 08/17/15   Gardiner Barefoot, DPM  feeding supplement (BOOST / RESOURCE BREEZE) LIQD Take 1 Container by mouth 3 (three) times daily between meals. 01/27/15   Shanker Kristeen Mans, MD  fluticasone (FLONASE) 50 MCG/ACT nasal spray Place 2 sprays into both nostrils daily. 08/07/14   Gildardo Cranker, DO  furosemide (LASIX) 20 MG tablet Take 1 tablet (20 mg total) by mouth daily. 07/23/15   Almyra Deforest, PA  gabapentin (NEURONTIN) 300 MG capsule Take 2 caps po in AM  and qhs, take 1 cap daily at [facility] 08/06/15   Gildardo Cranker, DO  hydrALAZINE (APRESOLINE) 50 MG tablet Take 1 tablet (50 mg total) by mouth 3 (three) times daily. 08/10/15   Isaiah Serge, NP  isosorbide mononitrate (IMDUR) 30 MG 24 hr tablet TAKE 1 TABLET (30 MG TOTAL) BY MOUTH DAILY. 06/22/15   Historical Provider, MD  lactulose (CHRONULAC) 10 GM/15ML solution Take 15 mLs (10 g total) by mouth daily as needed for mild constipation. 06/02/14   Mahima Bubba Camp, MD  latanoprost (XALATAN) 0.005 % ophthalmic solution Place 1 drop into both eyes at bedtime. 06/02/14   Blanchie Serve, MD  levothyroxine (SYNTHROID, LEVOTHROID) 50 MCG tablet Take 1 tablet (50 mcg total) by mouth daily before breakfast. 08/05/15   Florinda Marker, MD  Multiple Vitamins-Minerals (MULTIVITAMIN WITH MINERALS) tablet Take 1 tablet by mouth every morning.     Historical Provider, MD  pantoprazole (PROTONIX) 40 MG tablet TAKE 1 TABLET BY MOUTH TWICE A DAY 08/11/15   Gildardo Cranker, DO  Polyethyl Glycol-Propyl Glycol 0.4-0.3 % SOLN Place 1 drop into both eyes 4 (four) times daily.     Historical Provider, MD  polyethylene glycol (MIRALAX / GLYCOLAX)  packet Take 17 g by mouth 2 (two) times daily.     Historical Provider, MD  RESTASIS 0.05 % ophthalmic emulsion USE 1 DROP INTO BOTH EYES TWICE DAILY 10/19/14   Gildardo Cranker, DO  sennosides-docusate sodium (SENOKOT-S) 8.6-50 MG tablet Take 1 tablet by mouth 2 (two) times daily.     Historical Provider, MD  sertraline (ZOLOFT) 100 MG tablet TAKE 1 TABLET BY MOUTH ONCE DAILY FOR DEPRESSION 07/05/15   Gildardo Cranker, DO  sodium chloride 1 g tablet TAKE 1 TABLET BY MOUTH THREE TIMES DAILY 05/19/15   Gildardo Cranker, DO  traMADol (ULTRAM) 50 MG tablet Take 50 mg by mouth 2 (two) times daily as needed for moderate pain.     Historical Provider, MD   BP 153/74 mmHg  Pulse 73  Temp(Src) 98.2 F (36.8 C) (Oral)  Resp 16  SpO2 99%   Physical Exam  Constitutional: She is oriented to person, place, and time. She appears well-developed and well-nourished.  Elderly, very pleasant  HENT:  Head: Normocephalic and atraumatic.  Mouth/Throat: Oropharynx is clear and moist.  Eyes: Conjunctivae and EOM are normal. Pupils are equal, round, and reactive to light.  Neck: Normal range of motion.  Cardiovascular: Normal rate, regular rhythm and normal heart sounds.   Pulmonary/Chest: Effort normal and breath sounds normal.  Abdominal: Soft. Bowel sounds are decreased.  Well healed midline laparotomy scar noted Abdomen is soft, appears mildly distended for her body size, decreased bowel sounds  Musculoskeletal: Normal range of motion.  Neurological: She is alert and oriented to person, place, and time.  Skin: Skin is warm and dry.  Psychiatric: She has a normal mood and affect.  Nursing note and vitals reviewed.   ED Course  Procedures (including critical care time) Labs Review Labs Reviewed  COMPREHENSIVE METABOLIC PANEL - Abnormal; Notable for the following:    Creatinine, Ser 1.99 (*)    Albumin 3.4 (*)    GFR calc non Af Amer 21 (*)    GFR calc Af Amer 24 (*)    All other components within normal  limits  CBC - Abnormal; Notable for the following:    WBC 13.7 (*)    RBC 3.45 (*)    Hemoglobin 10.8 (*)    HCT 32.2 (*)  RDW 17.5 (*)    All other components within normal limits  URINALYSIS, ROUTINE W REFLEX MICROSCOPIC (NOT AT [facility]) - Abnormal; Notable for the following:    Ketones, ur 15 (*)    Protein, ur 100 (*)    All other components within normal limits  URINE MICROSCOPIC-ADD ON - Abnormal; Notable for the following:    Bacteria, UA RARE (*)    Casts HYALINE CASTS (*)    All other components within normal limits  URINE CULTURE  LIPASE, BLOOD  BRAIN NATRIURETIC PEPTIDE  I-STAT TROPOININ, ED    Imaging Review Ct Abdomen Pelvis Wo Contrast  08/26/2015  CLINICAL DATA:  Left upper quadrant pain with nausea and vomiting for 1 week. EXAM: CT ABDOMEN AND PELVIS WITHOUT CONTRAST TECHNIQUE: Multidetector CT imaging of the abdomen and pelvis was performed following the standard protocol without IV contrast. COMPARISON:  07/05/2015 FINDINGS: Lower chest and abdominal wall: Extensive ventral abdominal wall scarring with a fatty hernia left of midline, likely incisional. There is fluid within the thick walled lower esophagus which contains refluxed or non cleared fluid. Eventration of the right diaphragm. Hepatobiliary: Multiple hepatic low densities fairly circumscribed and likely cysts.Cholecystectomy. Pancreas: Unremarkable. Spleen: Unremarkable. Adrenals/Urinary Tract: Negative adrenals. No hydronephrosis or stone. Bladder wall thickening. Very Peristranding could be related to generalized edema or cystitis. Reproductive:No pathologic findings. Stomach/Bowel: The stomach is circumferentially thick walled and moderately distended. The antral region is particularly thickened and collapsed. Suture along the lower gastric body. No small bowel dilatation. There is a proximal enteroenteric anastomosis. The proximal colon shows wall thickening and pericolonic edema, greatest along the proximal  transverse segment. Colonic inflammatory changes are fairly extensive, more compatible with colitis and diverticulitis. No pneumatosis or pneumoperitoneum. Scattered colonic and ileal diverticulosis. Possible previous sigmoidectomy. Vascular/Lymphatic: No acute vascular Find.  No mass or adenopathy. Peritoneal: No ascites or pneumoperitoneum. Musculoskeletal: No acute abnormalities. Remote appearing T11 superior endplate compression fracture. Lumbar facet arthropathy with mild L5-S1 anterolisthesis. IMPRESSION: 1. Moderately distended stomach with thickened antral region. Patient has history of chronic impaired gastric emptying and recent EGD. 2. Signs of gastritis and esophagitis. 3. Colitis with nonspecific pattern. 4. Cystitis, correlate with urinalysis. Electronically Signed   By: Monte Fantasia M.D.   On: 08/26/2015 09:24   Dg Chest 2 View  08/26/2015  CLINICAL DATA:  Abdominal pain, nausea and vomiting. Gastroesophageal reflux. EXAM: CHEST  2 VIEW COMPARISON:  08/02/2015 and chest CT dated 07/07/2015. FINDINGS: Borderline enlarged cardiac silhouette with an interval decrease in size. Linear density at both lung bases. Mildly prominent pulmonary vasculature. Mild diffuse peribronchial thickening. Stable right diaphragmatic eventration. Atheromatous arterial calcifications. Upper abdominal surgical clips. Diffuse osteopenia. Bilateral shoulder degenerative changes. IMPRESSION: 1. Mildly improved left basilar linear atelectasis and possible scarring. 2. Interval mild right basilar linear atelectasis. 3. Improved cardiomegaly. 4. Mild pulmonary vascular congestion. 5. Mild bronchitic changes. Electronically Signed   By: Claudie Revering M.D.   On: 08/26/2015 07:22   I have personally reviewed and evaluated these images and lab results as part of my medical decision-making.   EKG Interpretation None      MDM   Final diagnoses:  Diverticulitis of large intestine without perforation or abscess without  bleeding  Non-intractable vomiting with nausea, vomiting of unspecified type   82 year old female Here with nausea and vomiting. She reports abdominal pain and change in bowel habits over the past few days. She does have history of recurrent small bowel obstruction. Patient is currently afebrile, nontoxic. She is  actively vomiting on arrival to ED. Her vital signs are stable. Abdomen is soft but with decreased bowel sounds.  Will plan for labs, CT. Patient also reports some shortness of breath, this seems to be somewhat of an ongoing issue over the past month. Will add chest x-ray, cardiac enzymes, BNP.  Does not appear significant with fluid overloaded at this time.  Labs overall reassuring, slight elevation of her serum creatinine compared with her baseline. This may be from component of dehydration given her repetitive vomiting. She does have a leukocytosis noted. Cardiac enzymes negative. BMP within normal limits. Chest x-ray without acute changes today, mild vascular congestion noted.  CT with evidence of gastritis/colitis and changes of diverticulitis.  No abscess or perforation. Given patient's age and her multiple comorbidities as well as extensive GI history, feel reasonable to admit for IV antibiotics and close monitoring.  Her vomiting is currently well controlled with reglan.  Will start cipro/flagyl, small dose IV fluids given.  Hospitalist consulted for admission-- will evaluate in the ED and admit.  Larene Pickett, PA-C 08/26/15 [place], MD 08/27/15 [phone]

## 2015-08-26 NOTE — ED Notes (Signed)
Attempted report x1. 

## 2015-08-26 NOTE — Significant Event (Signed)
Spoke with patient's daughter Shannon [name], also POA, regarding code status.  Limited code: ACLS meds / defib okay but NO chest compressions and NO intubation.

## 2015-08-26 NOTE — Progress Notes (Signed)
ANTICOAGULATION CONSULT NOTE - Initial Consult  Pharmacy Consult for heparin Indication: atrial fibrillation  No Known Allergies  Patient Measurements: Height: 5\' 5"  (165.1 cm) Weight: 150 lb 11.2 oz (68.357 kg) IBW/kg (Calculated) : 57 Heparin Dosing Weight: 68.4  Vital Signs: Temp: 97.8 F (36.6 C) (05/25 1651) Temp Source: Oral (05/25 0621) BP: 167/70 mmHg (05/25 1651) Pulse Rate: 75 (05/25 1651)  Labs:  Recent Labs  08/26/15 0630  HGB 10.8*  HCT 32.2*  PLT 335  CREATININE 1.99*    Estimated Creatinine Clearance: 18.3 mL/min (by C-G formula based on Cr of 1.99).   Medical History: Past Medical History  Diagnosis Date  . Pneumonia   . Hypertension   . Small bowel obstruction ([place])     "I don't know how many" (01/11/2015)  . Ventral hernia with bowel obstruction   . Asthma   . Hyperlipidemia   . Stroke ([place])     "light one"  . COPD (chronic obstructive pulmonary disease) ([place])   . Hypothyroidism   . Type II diabetes mellitus ([place])   . Anemia     previous blood transfusions  . GERD (gastroesophageal reflux disease)   . History of stomach ulcers   . Arthritis     "all over"  . Renal insufficiency   . Depression     "light case"  . SIADH (syndrome of inappropriate ADH production) ([place])     [name] 01/10/2015  . Perforated gastric ulcer ([place])   . Gastric stenosis   . Paraesophageal hernia   . Bradycardia     topped BB and amiodarone decreased- junctional rhythm     Medications:  Scheduled:  . [START ON 08/27/2015] ciprofloxacin  400 mg Intravenous Q24H  . fluticasone  2 spray Each Nare Daily  . ipratropium-albuterol  3 mL Nebulization Q4H  . levothyroxine  25 mcg Intravenous Q breakfast  . metronidazole  500 mg Intravenous Q8H  . pantoprazole (PROTONIX) IV  40 mg Intravenous Q24H    Assessment: 82 yo woman admitted 08/26/2015 with abdominal pain on apixaban PTA for hx AFlutter. CHADSVASc at least 5. Pharmacy consulted to dose heparin.  HTN,  HLD, hx CVA, COPD, hypothyroidism, DM, anemia, gastric ulcers, multiple SBO, AFlutter   Last dose apixaban yesterday at unknown time. Will start on heparin gtt with no bolus and monitor both aPTT and anti-Xa until both correlate. Baseline aPTT and anti-Xa will be drawn prior to heparin initiation.  Goal of Therapy:  Heparin level 0.3-0.7 units/ml aPTT 66-102 seconds Monitor platelets by anticoagulation protocol: Yes   Plan:  Heparin 950 units/h 2300 aPTT Daily aPTT/heparin level, CBC Monitor s/sx bleeding    Heloise Ochoa, Pharm.D., BCPS PGY2 Cardiology Pharmacy Resident Pager: [phone] 08/26/2015,5:00 PM

## 2015-08-26 NOTE — ED Notes (Signed)
Patient transported to X-ray 

## 2015-08-26 NOTE — Telephone Encounter (Signed)
[facility]

## 2015-08-26 NOTE — H&P (Signed)
History and Physical    Shannon [name] KGM:[id number] DOB: [birth year] DOA: 08/26/2015  PCP: Kirt Boys, DO  Patient coming from:  Home   Chief Complaint:  vomiting  HPI: Shannon [name] is a 82 y.o. female with and extensive past medical and surgical history . She's had multiple intestinal surgeries as listed below. Patient known to [facility]. Patient developed nausea several days ago then yesterday began vomiting. She has minor diarrhea and lower abdominal pain a couple of days ago. She just completed antibiotics for toe ulcer. No ill contacts.  ED Course:  Afebrile, hemodynamically stable. WBC 13.7 Creatinine 1.99 Noncontrast CT -fluid and thick walled lower esophagus containing fluid, thick-walled stomach, moderately distended. No small bowel dilation. Proximal colon wall thickening and pericolonic edema colonic and memory changes fairly extensive suspicious for colitis or diverticulitis, cystits.  Review of Systems: As per HPI, otherwise 10 point review of systems negative.    Past Medical History  Diagnosis Date  . Pneumonia   . Hypertension   . Small bowel obstruction (HCC)     "I don't know how many" (01/11/2015)  . Ventral hernia with bowel obstruction   . Asthma   . Hyperlipidemia   . Stroke (HCC)     "light one"  . COPD (chronic obstructive pulmonary disease) (HCC)   . Hypothyroidism   . Type II diabetes mellitus (HCC)   . Anemia     previous blood transfusions  . GERD (gastroesophageal reflux disease)   . History of stomach ulcers   . Arthritis     "all over"  . Renal insufficiency   . Depression     "light case"  . SIADH (syndrome of inappropriate ADH production) (HCC)     [name] 01/10/2015  . Perforated gastric ulcer (HCC)   . Gastric stenosis   . Paraesophageal hernia   . Bradycardia     topped BB and amiodarone decreased- junctional rhythm     Past Surgical History  Procedure Laterality Date  . Tubal ligation    . Colectomy    . Hernia repair  2015   . Esophagogastroduodenoscopy N/A 01/19/2014    Procedure: ESOPHAGOGASTRODUODENOSCOPY (EGD);  Surgeon: Hilarie Fredrickson, MD;  Location: [facility];  Service: Endoscopy;  Laterality: N/A;  . Esophagogastroduodenoscopy N/A 01/20/2014    Procedure: ESOPHAGOGASTRODUODENOSCOPY (EGD);  Surgeon: Hilarie Fredrickson, MD;  Location: [facility];  Service: Endoscopy;  Laterality: N/A;  . Esophagogastroduodenoscopy N/A 03/19/2014    Procedure: ESOPHAGOGASTRODUODENOSCOPY (EGD);  Surgeon: Rachael Fee, MD;  Location: [facility];  Service: Endoscopy;  Laterality: N/A;  . Cholecystectomy open    . Ventral hernia repair  2015    incarcerated ventral hernia ([facility] 09/2013)/notes 01/10/2015  . Cataract extraction w/ intraocular lens  implant, bilateral    . Gastrojejunostomy      hx/notes 01/10/2015  . Laparotomy N/A 01/20/2015    Procedure: EXPLORATORY LAPAROTOMY;  Surgeon: Abigail Miyamoto, MD;  Location: [facility] OR;  Service: General;  Laterality: N/A;  . Lysis of adhesion N/A 01/20/2015    Procedure: LYSIS OF ADHESIONS < 1 HOUR;  Surgeon: Abigail Miyamoto, MD;  Location: [facility] OR;  Service: General;  Laterality: N/A;  . Esophagogastroduodenoscopy N/A 07/08/2015    Procedure: ESOPHAGOGASTRODUODENOSCOPY (EGD);  Surgeon: Sherrilyn Rist, MD;  Location: [facility] ENDOSCOPY;  Service: Endoscopy;  Laterality: N/A;     reports that she has never smoked. She has quit using smokeless tobacco. Her smokeless tobacco use included Snuff. She reports that she does not drink alcohol or  use illicit drugs.  No Known Allergies  Family History  Problem Relation Age of Onset  . Stroke Mother   . Heart attack Neg Hx   . Hypertension Mother   . Diabetes Brother     Prior to Admission medications   Medication Sig Start Date End Date Taking? Authorizing Provider  acetaminophen (TYLENOL) 325 MG tablet Take 325 mg by mouth every 6 (six) hours as needed for mild pain or fever.    Yes Historical Provider, MD  ADVAIR DISKUS 250-50 MCG/DOSE AEPB  INHALE 1 PUFF INTO THE LUNGS 2 (TWO) TIMES DAILY AS NEEDED FOR SHORTNESS OF BREATH 12/01/14  Yes Historical Provider, MD  albuterol (PROVENTIL HFA;VENTOLIN HFA) 108 (90 Base) MCG/ACT inhaler Inhale 2 puffs into the lungs every 6 (six) hours as needed for wheezing or shortness of breath. 06/04/15  Yes Kirt Boys, DO  amiodarone (PACERONE) 100 MG tablet Take 1 tablet (100 mg total) by mouth daily. 08/05/15  Yes Alexa Lucrezia Starch, MD  amLODipine (NORVASC) 10 MG tablet TAKE 1 TABLET BY MOUTH DAILY FOR HYPERTENSION 08/09/15  Yes Kirt Boys, DO  apixaban (ELIQUIS) 2.5 MG TABS tablet Take 1 tablet (2.5 mg total) by mouth 2 (two) times daily. 08/11/15  Yes Kirt Boys, DO  b complex vitamins tablet Take 1 tablet by mouth daily.    Yes Historical Provider, MD  calcium-vitamin D (OSCAL WITH D) 500-200 MG-UNIT per tablet Take 1 tablet by mouth daily with breakfast.    Yes Historical Provider, MD  chlorhexidine (PERIDEX) 0.12 % solution Use as directed 15 mLs in the mouth or throat 2 (two) times daily.   Yes Historical Provider, MD  [employer] GAS RELIEF 80 MG chewable tablet CHEW 1 TABLET BY MOUTH 4 TIMES A DAY AS NEEDED FOR FLATULENCE 06/28/15  Yes Kirt Boys, DO  diclofenac sodium (VOLTAREN) 1 % GEL Apply 2 g topically daily as needed (for pain).    Yes Historical Provider, MD  doxycycline (VIBRA-TABS) 100 MG tablet Take 1 tablet (100 mg total) by mouth 2 (two) times daily. 08/17/15  Yes Helane Gunther, DPM  feeding supplement (BOOST / RESOURCE BREEZE) LIQD Take 1 Container by mouth 3 (three) times daily between meals. 01/27/15  Yes Shanker Levora Dredge, MD  fluticasone (FLONASE) 50 MCG/ACT nasal spray Place 2 sprays into both nostrils daily. 08/07/14  Yes Kirt Boys, DO  furosemide (LASIX) 20 MG tablet Take 1 tablet (20 mg total) by mouth daily. 07/23/15  Yes Azalee Course, PA  gabapentin (NEURONTIN) 300 MG capsule Take 2 caps po in AM and qhs, take 1 cap daily at [facility] 08/06/15  Yes Kirt Boys, DO  hydrALAZINE (APRESOLINE) 50  MG tablet Take 1 tablet (50 mg total) by mouth 3 (three) times daily. 08/10/15  Yes Leone Brand, NP  isosorbide mononitrate (IMDUR) 30 MG 24 hr tablet TAKE 1 TABLET (30 MG TOTAL) BY MOUTH DAILY. 06/22/15  Yes Historical Provider, MD  lactulose (CHRONULAC) 10 GM/15ML solution Take 15 mLs (10 g total) by mouth daily as needed for mild constipation. 06/02/14  Yes Mahima Pandey, MD  latanoprost (XALATAN) 0.005 % ophthalmic solution Place 1 drop into both eyes at bedtime. 06/02/14  Yes Mahima Glade Lloyd, MD  levothyroxine (SYNTHROID, LEVOTHROID) 50 MCG tablet Take 1 tablet (50 mcg total) by mouth daily before breakfast. 08/05/15  Yes Alexa Lucrezia Starch, MD  Multiple Vitamins-Minerals (MULTIVITAMIN WITH MINERALS) tablet Take 1 tablet by mouth every morning.    Yes Historical Provider, MD  pantoprazole (PROTONIX) 40 MG tablet  TAKE 1 TABLET BY MOUTH TWICE A DAY 08/11/15  Yes Kirt Boys, DO  Polyethyl Glycol-Propyl Glycol 0.4-0.3 % SOLN Place 1 drop into both eyes 4 (four) times daily.    Yes Historical Provider, MD  polyethylene glycol (MIRALAX / GLYCOLAX) packet Take 17 g by mouth 2 (two) times daily.    Yes Historical Provider, MD  RESTASIS 0.05 % ophthalmic emulsion USE 1 DROP INTO BOTH EYES TWICE DAILY 10/19/14  Yes Kirt Boys, DO  sennosides-docusate sodium (SENOKOT-S) 8.6-50 MG tablet Take 1 tablet by mouth 2 (two) times daily.    Yes Historical Provider, MD  sodium chloride 1 g tablet TAKE 1 TABLET BY MOUTH THREE TIMES DAILY 05/19/15  Yes Kirt Boys, DO  traMADol (ULTRAM) 50 MG tablet Take 50 mg by mouth 2 (two) times daily as needed for moderate pain.    Yes Historical Provider, MD  atorvastatin (LIPITOR) 10 MG tablet Take one tablet by mouth once daily 08/26/15   Kirt Boys, DO  sertraline (ZOLOFT) 100 MG tablet Take one tablet by mouth once daily for depression 08/26/15   Kirt Boys, DO    Physical Exam: Filed Vitals:   08/26/15 0830 08/26/15 1022 08/26/15 1100 08/26/15 1200  BP: 138/66 151/63  161/74 163/66  Pulse: 69 63 72 65  Temp:      TempSrc:      Resp: 15 19 19 17   SpO2: 99% 94% 100% 97%    Constitutional:  Pleasant black female in NAD, calm, comfortable Filed Vitals:   08/26/15 0830 08/26/15 1022 08/26/15 1100 08/26/15 1200  BP: 138/66 151/63 161/74 163/66  Pulse: 69 63 72 65  Temp:      TempSrc:      Resp: 15 19 19 17   SpO2: 99% 94% 100% 97%   Eyes: PER, lids and conjunctivae normal ENMT: Mucous membranes are moist. Posterior pharynx clear of any exudate or lesions. Neck: normal, supple, no masses Respiratory: expiratory wheezing in both lungs. Cardiovascular: Regular rate, irreg rhythm.  No extremity edema. 2+ pedal pulses. No carotid bruits.  Abdomen: no tenderness, no masses palpated. No hepatomegaly. Bowel sounds positive.  Musculoskeletal: no clubbing / cyanosis. No joint deformity upper and lower extremities. Good ROM, no contractures. Normal muscle tone.  Skin: no rashes, lesions, ulcers.  Neurologic: CN 2-12 grossly intact. Sensation intact, Strength 5/5 in all 4.  Psychiatric: Normal judgment and insight. Alert and oriented x 3. Normal mood.   (Anything < 9 systems with 2 bullets each down codes to level 1)  (Make sure to document decubitus ulcers present on admission -- if possible -- and whether patient has chronic indwelling catheter at time of admission)  Labs on Admission: I have personally reviewed following labs and imaging studies  CBC:  Recent Labs Lab 08/26/15 0630  WBC 13.7*  HGB 10.8*  HCT 32.2*  MCV 93.3  PLT 335   Basic Metabolic Panel:  Recent Labs Lab 08/26/15 0630  NA 139  K 3.9  CL 105  CO2 24  GLUCOSE 67  BUN 17  CREATININE 1.99*  CALCIUM 9.9   GFR: CrCl cannot be calculated (Unknown ideal weight.). Liver Function Tests:  Recent Labs Lab 08/26/15 0630  AST 37  ALT 27  ALKPHOS 118  BILITOT 0.5  PROT 6.6  ALBUMIN 3.4*    Recent Labs Lab 08/26/15 0630  LIPASE 18    Urine analysis:      Component Value Date/Time   COLORURINE YELLOW 08/26/2015 0737   APPEARANCEUR CLEAR 08/26/2015 0737  LABSPEC 1.022 08/26/2015 0737   PHURINE 6.5 08/26/2015 0737   GLUCOSEU NEGATIVE 08/26/2015 0737   HGBUR NEGATIVE 08/26/2015 0737   BILIRUBINUR NEGATIVE 08/26/2015 0737   KETONESUR 15* 08/26/2015 0737   PROTEINUR 100* 08/26/2015 0737   UROBILINOGEN 0.2 01/27/2015 2308   NITRITE NEGATIVE 08/26/2015 0737   LEUKOCYTESUR NEGATIVE 08/26/2015 0737     Sepsis Labs: @LABRCNTIP (procalcitonin:4,lacticidven:4) )No results found for this or any previous visit (from the past 240 hour(s)).   Radiological Exams on Admission: Ct Abdomen Pelvis Wo Contrast  08/26/2015  CLINICAL DATA:  Left upper quadrant pain with nausea and vomiting for 1 week. EXAM: CT ABDOMEN AND PELVIS WITHOUT CONTRAST TECHNIQUE: Multidetector CT imaging of the abdomen and pelvis was performed following the standard protocol without IV contrast. COMPARISON:  07/05/2015 FINDINGS: Lower chest and abdominal wall: Extensive ventral abdominal wall scarring with a fatty hernia left of midline, likely incisional. There is fluid within the thick walled lower esophagus which contains refluxed or non cleared fluid. Eventration of the right diaphragm. Hepatobiliary: Multiple hepatic low densities fairly circumscribed and likely cysts.Cholecystectomy. Pancreas: Unremarkable. Spleen: Unremarkable. Adrenals/Urinary Tract: Negative adrenals. No hydronephrosis or stone. Bladder wall thickening. Very Peristranding could be related to generalized edema or cystitis. Reproductive:No pathologic findings. Stomach/Bowel: The stomach is circumferentially thick walled and moderately distended. The antral region is particularly thickened and collapsed. Suture along the lower gastric body. No small bowel dilatation. There is a proximal enteroenteric anastomosis. The proximal colon shows wall thickening and pericolonic edema, greatest along the proximal transverse  segment. Colonic inflammatory changes are fairly extensive, more compatible with colitis and diverticulitis. No pneumatosis or pneumoperitoneum. Scattered colonic and ileal diverticulosis. Possible previous sigmoidectomy. Vascular/Lymphatic: No acute vascular Find.  No mass or adenopathy. Peritoneal: No ascites or pneumoperitoneum. Musculoskeletal: No acute abnormalities. Remote appearing T11 superior endplate compression fracture. Lumbar facet arthropathy with mild L5-S1 anterolisthesis. IMPRESSION: 1. Moderately distended stomach with thickened antral region. Patient has history of chronic impaired gastric emptying and recent EGD. 2. Signs of gastritis and esophagitis. 3. Colitis with nonspecific pattern. 4. Cystitis, correlate with urinalysis. Electronically Signed   By: Marnee Spring M.D.   On: 08/26/2015 09:24   Dg Chest 2 View  08/26/2015  CLINICAL DATA:  Abdominal pain, nausea and vomiting. Gastroesophageal reflux. EXAM: CHEST  2 VIEW COMPARISON:  08/02/2015 and chest CT dated 07/07/2015. FINDINGS: Borderline enlarged cardiac silhouette with an interval decrease in size. Linear density at both lung bases. Mildly prominent pulmonary vasculature. Mild diffuse peribronchial thickening. Stable right diaphragmatic eventration. Atheromatous arterial calcifications. Upper abdominal surgical clips. Diffuse osteopenia. Bilateral shoulder degenerative changes. IMPRESSION: 1. Mildly improved left basilar linear atelectasis and possible scarring. 2. Interval mild right basilar linear atelectasis. 3. Improved cardiomegaly. 4. Mild pulmonary vascular congestion. 5. Mild bronchitic changes. Electronically Signed   By: Beckie Salts M.D.   On: 08/26/2015 07:22    EKG: Independently reviewed.     Assessment/Plan   Active Problems:   * No active hospital problems. *   Nausea and vomiting. Complex gastrointestinal medical and surgical history including a sigmoidectomy (? Reason), remote gastro-jejunostomy and  suspected vagotomy, resection of gastrojejunal anastomosis 2015 at [facility]. She has a history of gastroparesis (? Vagotomy) with gastric bezoar formation, history of severe candida esophagitis, SBO requiring exp lap and LOA Oct 2016. Known to Dr. Marina Goodell with [facility]. Last EGD for dysphagia in April revealed moderate pyloric stenosis.    No evidence for bowel obstruction on CT scan today. Symptoms may be secondary to GOO  as CTscan reveals distended stomach with thickened walls ,especially nvolving the antrum . Additionally, the proximal colon shows extensive wall thickening and pericolonic edema greatest along the proximal transverse segment. Patient has had some minor abdominal pain associated with minor diarrhea a few days ago but her main complaint is nausea and vomiting.  -Admit to medical bed -IV hydration -Nothing by mouth -IV antiemetics -Continue IV Cipro and Flagyl for possible colitis -If vomiting persists despite nothing by mouth status will need NG tube placement to low intermittent suction -IV PPI -GI consult if fails to improve  Hx of PAF. Rate controlled. On amiodarone and Eloquis  -Convert to IV amiodarone for now -Hold Eliquis and will start Heparin per pharm  Asthma, wheezing on exam. No respiratory distress. Uses Dulera only as needed last use was 2 days  -duoneb Q4.   Diabetes, type II, diet controlled -No recent A1c in Epic, will check one -CBGs qam  Diabetic ulcer between right big toe and 2nd toe. Followed by wound care center, has follow-up appointment tomorrow. Completed course of antibiotics yesterday (doxycycline?  -Consult wound care and inpatient  Chronic diastolic CHF, on imdur, lasix 20mg  daily.  Hold both meds for now.   Atrial fibrillation , on Pacerone -Unable to tolerate by mouth. Tried to change to IV amiodarone but the IV form would be to large of a dose per pharmacy.  -Currently HR in 60-70's. Will hold on Amiodarone. If becomes tachy will then give the  IV amio or IV CCB.   Hypertension -IV hydralazine as needed  CKD, stage 3. Cr 1.99, baseline 1.3   Hypothyroidism. TSH 08/02/15 was 83.  -Change home oral Synthroid to IV -Check TSH  Constipation, on miralax and senokot. -Dulcolax suppositories as needed until taking by mouth again  Depression. Stable.  -Holding Zoloft until taking by mouth again  GERD.  On PPI at home - IV PPI  DVT prophylaxis:     Anti-coagulated with Eliquis Code Status:   Full code  -until I speak with daughter as patient wishes me to do DNR / DNI Family Communication:  Will attempt to reach daughter, POA.  Disposition Plan: Discharge home  In 2-3 days             Consults called:    none Admission status:  Observation - Medical bed / telemetry  Admission -  Medical bed  Telemetry    Willette Cluster NP [facility] Pager [phone]  If 7PM-7AM, please contact night-coverage [email or web address] Password [facility]  08/26/2015, 12:46 PM

## 2015-08-26 NOTE — ED Notes (Signed)
Pt to ED from home by EMS c/o emesis onset at 8pm. Hx of SBO. EMS gave 4mg  zofran enroute with mild relief of symptoms

## 2015-08-27 ENCOUNTER — Inpatient Hospital Stay (HOSPITAL_COMMUNITY): Payer: Medicare Other | Admitting: Anesthesiology

## 2015-08-27 ENCOUNTER — Encounter (HOSPITAL_BASED_OUTPATIENT_CLINIC_OR_DEPARTMENT_OTHER): Payer: Medicare Other

## 2015-08-27 ENCOUNTER — Encounter (HOSPITAL_COMMUNITY): Payer: Self-pay | Admitting: Anesthesiology

## 2015-08-27 ENCOUNTER — Inpatient Hospital Stay (HOSPITAL_COMMUNITY): Payer: Medicare Other

## 2015-08-27 ENCOUNTER — Encounter (HOSPITAL_COMMUNITY)
Admission: EM | Disposition: A | Discharge status: Home / Self Care | Payer: Self-pay | Source: Home / Self Care | Attending: Internal Medicine

## 2015-08-27 DIAGNOSIS — K529 Noninfective gastroenteritis and colitis, unspecified: Secondary | ICD-10-CM

## 2015-08-27 DIAGNOSIS — T182XXS Foreign body in stomach, sequela: Secondary | ICD-10-CM

## 2015-08-27 LAB — BASIC METABOLIC PANEL
Anion gap: 14 (ref 5–15)
BUN: 20 mg/dL (ref 6–20)
CO2: 20 mmol/L — ABNORMAL LOW (ref 22–32)
Calcium: 9.5 mg/dL (ref 8.9–10.3)
Chloride: 107 mmol/L (ref 101–111)
Creatinine, Ser: 2.27 mg/dL — ABNORMAL HIGH (ref 0.44–1.00)
GFR calc Af Amer: 21 mL/min — ABNORMAL LOW (ref >60)
GFR calc non Af Amer: 18 mL/min — ABNORMAL LOW (ref >60)
Glucose, Bld: 56 mg/dL — ABNORMAL LOW (ref 65–99)
Potassium: 3.6 mmol/L (ref 3.5–5.1)
Sodium: 141 mmol/L (ref 135–145)

## 2015-08-27 LAB — CBC
HCT: 34.4 % — ABNORMAL LOW (ref 36.0–46.0)
Hemoglobin: 11.3 g/dL — ABNORMAL LOW (ref 12.0–15.0)
MCH: 30.9 pg (ref 26.0–34.0)
MCHC: 32.8 g/dL (ref 30.0–36.0)
MCV: 94 fL (ref 78.0–100.0)
Platelets: 366 10*3/uL (ref 150–400)
RBC: 3.66 MIL/uL — ABNORMAL LOW (ref 3.87–5.11)
RDW: 17.6 % — ABNORMAL HIGH (ref 11.5–15.5)
WBC: 15.7 10*3/uL — ABNORMAL HIGH (ref 4.0–10.5)

## 2015-08-27 LAB — URINE CULTURE: Culture: NO GROWTH

## 2015-08-27 LAB — HEPARIN LEVEL (UNFRACTIONATED): Heparin Unfractionated: 1.1 IU/mL — ABNORMAL HIGH (ref 0.30–0.70)

## 2015-08-27 LAB — APTT
aPTT: 92 seconds — ABNORMAL HIGH (ref 24–37)
aPTT: 126 seconds — ABNORMAL HIGH (ref 24–37)
aPTT: 102 seconds — ABNORMAL HIGH (ref 24–37)

## 2015-08-27 SURGERY — AMPUTATION DIGIT
Anesthesia: General | Laterality: Right

## 2015-08-27 MED ORDER — ONDANSETRON HCL 4 MG/2ML IJ SOLN
4.0000 mg | Freq: Two times a day (BID) | INTRAMUSCULAR | Status: DC
  Administered 2015-08-27 – 2015-09-03 (×12): 4 mg via INTRAVENOUS
  Filled 2015-08-27 (×13): qty 2

## 2015-08-27 MED ORDER — IPRATROPIUM-ALBUTEROL 0.5-2.5 (3) MG/3ML IN SOLN
3.0000 mL | Freq: Three times a day (TID) | RESPIRATORY_TRACT | Status: DC
  Administered 2015-08-27 – 2015-08-30 (×9): 3 mL via RESPIRATORY_TRACT
  Filled 2015-08-27 (×9): qty 3

## 2015-08-27 MED ORDER — PROPOFOL 10 MG/ML IV BOLUS
INTRAVENOUS | Status: AC
  Filled 2015-08-27: qty 20

## 2015-08-27 MED ORDER — METOCLOPRAMIDE HCL 5 MG/ML IJ SOLN
5.0000 mg | Freq: Four times a day (QID) | INTRAMUSCULAR | Status: DC
  Administered 2015-08-27: 5 mg via INTRAVENOUS
  Filled 2015-08-27: qty 2

## 2015-08-27 MED ORDER — FENTANYL CITRATE (PF) 250 MCG/5ML IJ SOLN
INTRAMUSCULAR | Status: AC
  Filled 2015-08-27: qty 5

## 2015-08-27 MED ORDER — HEPARIN (PORCINE) IN NACL 100-0.45 UNIT/ML-% IJ SOLN
800.0000 [IU]/h | INTRAMUSCULAR | Status: DC
  Administered 2015-08-27: 850 [IU]/h via INTRAVENOUS
  Administered 2015-08-28: 800 [IU]/h via INTRAVENOUS
  Filled 2015-08-27 (×2): qty 250

## 2015-08-27 MED ORDER — IPRATROPIUM-ALBUTEROL 0.5-2.5 (3) MG/3ML IN SOLN
3.0000 mL | Freq: Four times a day (QID) | RESPIRATORY_TRACT | Status: DC
  Administered 2015-08-27: 3 mL via RESPIRATORY_TRACT
  Filled 2015-08-27: qty 3

## 2015-08-27 MED ORDER — ALBUTEROL SULFATE (2.5 MG/3ML) 0.083% IN NEBU: 2.5000 mg | INHALATION_SOLUTION | RESPIRATORY_TRACT | Status: DC | PRN

## 2015-08-27 NOTE — Progress Notes (Addendum)
[place] for heparin Indication: atrial fibrillation  No Known Allergies  Patient Measurements: Height: 5\' 5"  (165.1 cm) Weight: 150 lb 11.2 oz (68.357 kg) IBW/kg (Calculated) : 57 Heparin Dosing Weight: 68.4  Vital Signs: Temp: 98.4 F (36.9 C) (05/26 0539) Temp Source: Oral (05/26 0539) BP: 162/66 mmHg (05/26 0539) Pulse Rate: 82 (05/26 0539)  Labs:  Recent Labs  08/26/15 0630 08/26/15 1615 08/27/15 0153 08/27/15 0931  HGB 10.8*  --  11.3*  --   HCT 32.2*  --  34.4*  --   PLT 335  --  366  --   APTT  --  36 92* 126*  HEPARINUNFRC  --  0.80* 1.10*  --   CREATININE 1.99*  --  2.27*  --     Estimated Creatinine Clearance: 16 mL/min (by C-G formula based on Cr of 2.27).   Medical History: Past Medical History  Diagnosis Date  . Pneumonia   . Hypertension   . Small bowel obstruction ([place])     "I don't know how many" (01/11/2015)  . Ventral hernia with bowel obstruction   . Asthma   . Hyperlipidemia   . Stroke ([place])     "light one"  . COPD (chronic obstructive pulmonary disease) ([place])   . Hypothyroidism   . Type II diabetes mellitus ([place])   . Anemia     previous blood transfusions  . GERD (gastroesophageal reflux disease)   . History of stomach ulcers   . Arthritis     "all over"  . Renal insufficiency   . Depression     "light case"  . SIADH (syndrome of inappropriate ADH production) ([place])     [name] 01/10/2015  . Perforated gastric ulcer ([place])   . Gastric stenosis   . Paraesophageal hernia   . Bradycardia     topped BB and amiodarone decreased- junctional rhythm     Medications:  Scheduled:  . ciprofloxacin  400 mg Intravenous Q24H  . fluticasone  2 spray Each Nare Daily  . ipratropium-albuterol  3 mL Nebulization TID  . levothyroxine  25 mcg Intravenous Q breakfast  . metoCLOPramide (REGLAN) injection  5 mg Intravenous Q6H  . metronidazole  500 mg Intravenous Q8H  . pantoprazole (PROTONIX) IV  40 mg  Intravenous Q24H    Assessment: 82 yo woman admitted 08/26/2015 with abdominal pain on apixaban PTA for hx AFlutter. CHADSVASc at least 5. Pharmacy consulted to dose heparin.  Last dose apixaban yesterday at unknown time. Will start on heparin gtt with no bolus and monitor both aPTT and anti-Xa until both correlate. Baseline aPTT and anti-Xa will be drawn prior to heparin initiation.  PTT came back a bit elevated this AM. Pt might need toe amputation today so heparin has been on hold.  Goal of Therapy:  Heparin level 0.3-0.7 units/ml aPTT 66-102 seconds Monitor platelets by anticoagulation protocol: Yes   Plan:   Heparin on hold for now for possible amputation F/u restart  Onnie Boer, PharmD Pager: [phone] 08/27/2015 10:55 AM

## 2015-08-27 NOTE — Consult Note (Signed)
WOC wound consult note Reason for Consult: right toe wound. Reviewed chart, patient followed by podiatry.  Xray this month +osteomylitis.  She reports wearing some type of orthotic to correct deformity of the great toe, however a blister and then ulcer developed from this.  She has had debridement per podiatry note of the ulcer between the great toe and the 2nd toe.  I was not able to palpate her DP pulse easily, found with doppler but weak. The foot is cool. She does not have significant edema.  The third toe is dark and swollen with darkness that extend 3-4 cm under the 3rd and 2nd toe. No drainage from the 3rd toe. Wound type: etiology?  Unclear, may be pressure/trauma from orthotic and with her weak pulse may have lead to poor healing.   Measurement: ulcer is between great toe and 2nd toe is 0.2cm x 0.2cm x 0.2cm  Wound bed: pink centrally but with maceration all along the periwound Drainage (amount, consistency, odor) yellow, no odor Periwound: see above Dressing procedure/placement/frequency: will add silver hydrofiber for now for exudate management. I am concerned about the discoloration of the 3rd toe and the weak pulse. May consider vascular or orthopedic evaluation.   Discussed POC with patient and bedside nurse.  Re consult if needed, will not follow at this time. Thanks  Ineta Sinning [place], [place] [phone])

## 2015-08-27 NOTE — Progress Notes (Signed)
Initial Nutrition Assessment  DOCUMENTATION CODES:   Not applicable  INTERVENTION:   -Once diet is advanced, add Boost Breeze po TID, each supplement provides 250 kcal and 9 grams of protein  NUTRITION DIAGNOSIS:   Inadequate oral intake related to altered GI function as evidenced by NPO status.  GOAL:   Patient will meet greater than or equal to 90% of their needs  MONITOR:   Diet advancement, Labs, Weight trends, Skin, I & O's  REASON FOR ASSESSMENT:   Malnutrition Screening Tool    ASSESSMENT:   Shannon [name] is a 82 y.o. female with and extensive past medical and surgical history . She's had multiple intestinal surgeries as listed below. Patient known to [facility]. Patient developed nausea several days ago then yesterday began vomiting. She has minor diarrhea and lower abdominal pain a couple of days ago. She just completed antibiotics for toe ulcer. No ill contacts.  Pt admitted with admitted with nausea and vomiting.   Spoke with pt at bedside. She reports poor appetite chronically, but especially for the past month after last hospital discharge secondary to abdominal pain. She reports she follows a dysphagia 1 diet at home. She reveals that she also consumes Boost Breeze supplements and is eager to receive them during this hospitalization. She reports they can be difficult to purchase as an outpatient; RD discussed ways to obtain Boost Breeze supplements ([place], special order through private pharmacy, and [employer]).  Pt confirms weight loss, however, unable to confirm amount or frequency. Per wt hx, pt has experienced weight gain trend over the past several months.   Nutrition-Focused physical exam completed. Findings are mild fat depletion, mild muscle depletion, and no edema. Suspect fat and muscle wasting related to advanced age.   Pt expressed understanding of NPO order, but is eager for diet advancement to receive supplements. Discussed  importance of good meal and supplement intake to promote healing.    Labs reviewed.   Diet Order:  Diet NPO time specified  Skin:  Wound (see comment) (rt gangrenous blister on rt toe)  Last BM:  08/26/14  Height:   Ht Readings from Last 1 Encounters:  08/26/15 5\' 5"  (1.651 m)    Weight:   Wt Readings from Last 1 Encounters:  08/26/15 150 lb 11.2 oz (68.357 kg)    Ideal Body Weight:  56.8 kg  BMI:  Body mass index is 25.08 kg/(m^2).  Estimated Nutritional Needs:   Kcal:  1500-1700  Protein:  75-85 grams  Fluid:  1.5-1.7 L  EDUCATION NEEDS:   Education needs addressed  Addam Goeller A. Jimmye Norman, RD, LDN, CDE Pager: [phone] After hours Pager: [phone]

## 2015-08-27 NOTE — Progress Notes (Signed)
   08/27/15 1400  Clinical Encounter Type  Visited With Patient  Visit Type Spiritual support  Referral From Patient  Consult/Referral To Chaplain  Spiritual Encounters  Spiritual Needs Prayer;Emotional   CHP answered consult request from patient for prayer. CHP indicated availability for follow-up if desired. Roe Coombs 3:02 PM

## 2015-08-27 NOTE — Progress Notes (Signed)
[facility]                                                                              Patient Demographics  Shannon [name], is a 82 y.o. female, DOB - 04-Apr-1923, NWG:[id number]  Admit date - 08/26/2015   Admitting Physician Joseph Art, DO  Outpatient Primary MD for the patient is Kirt Boys, DO  Outpatient specialists:   LOS - 1  days    Chief Complaint  Patient presents with  . Abdominal Pain       Brief summary   Shannon [name] is a 82 y.o. female with and extensive past medical and surgical history . She's had multiple intestinal surgeries as listed below. Patient known to [facility]. Patient developed nausea several days ago then yesterday began vomiting. She has minor diarrhea and lower abdominal pain a couple of days ago. She just completed antibiotics for toe ulcer. No ill contacts. CT VHQ:IONGEX moderately distended stomach with thickened antral region, signs of gastritis and his hepatitis, colitis with nonspecific pattern, cystitis     Assessment & Plan     Intractable Nausea and vomiting: Patient has long-standing GI and surgical history including a sigmoidectomy, remote gastro-jejunostomy and suspected vagotomy, resection of gastrojejunal anastomosis 2015 at [facility]. She has a history of gastroparesis with gastric bezoar formation, history of severe candida esophagitis, SBO requiring exp lap and LOA Oct 2016. Known to Dr. Marina Goodell with [facility].  - Last EGD for dysphagia in 4/16 showed diffuse candidiasis in the entire esophagus, gastric stenosis at the pylorus, normal duodenum  - No SBO on CT abdomen - Continue NPO, increase IV fluid hydration, placed on IV antiemetics,Reglan - Still having nausea and vomiting, will benefit from NG tube placement. GI also consulted (d/w Jennye Moccasin), will await further recommendations -Continue IV Cipro and Flagyl for possible colitis    atrial fibrillation  - Rate controlled. On amiodarone and Eliquis   -Currently rate controlled, -Hold Eliquis, on IV heparin drip.   Asthma - Mild wheezing, continue duo nebs  Diabetes, type II, diet controlled - Follow hemoglobin A1c  Diabetic ulcer , right third toe with gangrene  - Patient follows wound care Center, had completed a course of doxycycline  - Orthopedics consulted, may need right third toe amputation  Chronic diastolic CHF, on imdur, lasix 20mg  daily.  - Continue to hold Lasix, Cr trending up, due to intractable vomiting   Hypertension -IV hydralazine as needed  Acute on CKD, stage 3. Cr 1.99, baseline 1.3  - Hold Lasix, continue aggressive IV fluid hydration  Hypothyroidism. TSH 08/02/15 was 83.  -Change home oral Synthroid to IV -Check TSH  Constipation, on miralax and senokot, lactulose. -Dulcolax suppositories as needed until taking by mouth again  Depression. Stable.  -Holding Zoloft until taking by mouth again  GERD. On PPI at home - IV PPI  Code Status: Partial code  DVT Prophylaxis: On heparin drip   Family Communication: Discussed in detail with the patient, all imaging results, lab results explained to the patient   Disposition Plan:   Time Spent in minutes  25 minutes  Procedures:  CT abdomen  Consultants:   GI  Antimicrobials:   IV ciprofloxacin 5/25  IV Flagyl 5/25   Medications  Scheduled Meds: . ciprofloxacin  400 mg Intravenous Q24H  . fluticasone  2 spray Each Nare Daily  . ipratropium-albuterol  3 mL Nebulization QID  . levothyroxine  25 mcg Intravenous Q breakfast  . metronidazole  500 mg Intravenous Q8H  . pantoprazole (PROTONIX) IV  40 mg Intravenous Q24H   Continuous Infusions: . sodium chloride 75 mL/hr at 08/26/15 2344  . heparin 950 Units/hr (08/26/15 1825)   PRN Meds:.acetaminophen **OR** acetaminophen, albuterol, bisacodyl, hydrALAZINE, ondansetron **OR** ondansetron (ZOFRAN) IV   Antibiotics   Anti-infectives    Start     Dose/Rate Route Frequency  Ordered Stop   08/27/15 1100  ciprofloxacin (CIPRO) IVPB 400 mg     400 mg 200 mL/hr over 60 Minutes Intravenous Every 24 hours 08/26/15 1648     08/26/15 2300  ciprofloxacin (CIPRO) IVPB 400 mg  Status:  Discontinued     400 mg 200 mL/hr over 60 Minutes Intravenous Every 12 hours 08/26/15 1646 08/26/15 1648   08/26/15 2000  metroNIDAZOLE (FLAGYL) IVPB 500 mg     500 mg 100 mL/hr over 60 Minutes Intravenous Every 8 hours 08/26/15 1646     08/26/15 1600  metroNIDAZOLE (FLAGYL) IVPB 500 mg  Status:  Discontinued     500 mg 100 mL/hr over 60 Minutes Intravenous Every 8 hours 08/26/15 1559 08/26/15 1645   08/26/15 1600  ciprofloxacin (CIPRO) IVPB 400 mg  Status:  Discontinued     400 mg 200 mL/hr over 60 Minutes Intravenous Every 12 hours 08/26/15 1559 08/26/15 1646   08/26/15 1130  ciprofloxacin (CIPRO) IVPB 400 mg     400 mg 200 mL/hr over 60 Minutes Intravenous  Once 08/26/15 1119 08/26/15 1311   08/26/15 1130  metroNIDAZOLE (FLAGYL) IVPB 500 mg     500 mg 100 mL/hr over 60 Minutes Intravenous  Once 08/26/15 1119 08/26/15 1311        Subjective:   Shannon [name] was seen and examined today.  Still continues to have vomiting, abdominal distention, no fevers or chills. Denies any shortness of breath. No acute events overnight.    Objective:   Filed Vitals:   08/26/15 1649 08/26/15 1651 08/26/15 2242 08/27/15 0539  BP:  167/70 151/60 162/66  Pulse:  75 74 82  Temp:  97.8 F (36.6 C)  98.4 F (36.9 C)  TempSrc:    Oral  Resp:  24 20 18   Height: 5\' 5"  (1.651 m)     Weight: 68.357 kg (150 lb 11.2 oz)     SpO2:  96% 95% 97%    Intake/Output Summary (Last 24 hours) at 08/27/15 0919 Last data filed at 08/27/15 0646  Gross per 24 hour  Intake  727.5 ml  Output    500 ml  Net  227.5 ml     Wt Readings from Last 3 Encounters:  08/26/15 68.357 kg (150 lb 11.2 oz)  08/10/15 67.495 kg (148 lb 12.8 oz)  08/06/15 66.951 kg (147 lb 9.6 oz)     Exam  General: Alert and  oriented x 3, NAD  HEENT:  PERRLA, EOMI, Anicteric Sclera, mucous membranes moist.   Neck: Supple, no JVD, no masses  Cardiovascular: S1 S2 auscultated, no rubs, murmurs or gallops. Regular rate and rhythm.  Respiratory: Clear to auscultation bilaterally, no wheezing, rales or rhonchi  Gastrointestinal: Soft, nontender, distended, + bowel sounds  Ext: no cyanosis clubbing or  edema, Right third toe with gangrene  Neuro: AAOx3, Cr N's II- XII. Strength 5/5 upper and lower extremities bilaterally  Skin: No rashes  Psych: Normal affect and demeanor, alert and oriented x3    Data Reviewed:  I have personally reviewed following labs and imaging studies  Micro Results Recent Results (from the past 240 hour(s))  Urine culture     Status: None   Collection Time: 08/26/15  7:37 AM  Result Value Ref Range Status   Specimen Description URINE, CATHETERIZED  Final   Special Requests NONE  Final   Culture NO GROWTH  Final   Report Status 08/27/2015 FINAL  Final    Radiology Reports Ct Abdomen Pelvis Wo Contrast  08/26/2015  CLINICAL DATA:  Left upper quadrant pain with nausea and vomiting for 1 week. EXAM: CT ABDOMEN AND PELVIS WITHOUT CONTRAST TECHNIQUE: Multidetector CT imaging of the abdomen and pelvis was performed following the standard protocol without IV contrast. COMPARISON:  07/05/2015 FINDINGS: Lower chest and abdominal wall: Extensive ventral abdominal wall scarring with a fatty hernia left of midline, likely incisional. There is fluid within the thick walled lower esophagus which contains refluxed or non cleared fluid. Eventration of the right diaphragm. Hepatobiliary: Multiple hepatic low densities fairly circumscribed and likely cysts.Cholecystectomy. Pancreas: Unremarkable. Spleen: Unremarkable. Adrenals/Urinary Tract: Negative adrenals. No hydronephrosis or stone. Bladder wall thickening. Very Peristranding could be related to generalized edema or cystitis. Reproductive:No  pathologic findings. Stomach/Bowel: The stomach is circumferentially thick walled and moderately distended. The antral region is particularly thickened and collapsed. Suture along the lower gastric body. No small bowel dilatation. There is a proximal enteroenteric anastomosis. The proximal colon shows wall thickening and pericolonic edema, greatest along the proximal transverse segment. Colonic inflammatory changes are fairly extensive, more compatible with colitis and diverticulitis. No pneumatosis or pneumoperitoneum. Scattered colonic and ileal diverticulosis. Possible previous sigmoidectomy. Vascular/Lymphatic: No acute vascular Find.  No mass or adenopathy. Peritoneal: No ascites or pneumoperitoneum. Musculoskeletal: No acute abnormalities. Remote appearing T11 superior endplate compression fracture. Lumbar facet arthropathy with mild L5-S1 anterolisthesis. IMPRESSION: 1. Moderately distended stomach with thickened antral region. Patient has history of chronic impaired gastric emptying and recent EGD. 2. Signs of gastritis and esophagitis. 3. Colitis with nonspecific pattern. 4. Cystitis, correlate with urinalysis. Electronically Signed   By: Marnee Spring M.D.   On: 08/26/2015 09:24   Dg Chest 2 View  08/26/2015  CLINICAL DATA:  Abdominal pain, nausea and vomiting. Gastroesophageal reflux. EXAM: CHEST  2 VIEW COMPARISON:  08/02/2015 and chest CT dated 07/07/2015. FINDINGS: Borderline enlarged cardiac silhouette with an interval decrease in size. Linear density at both lung bases. Mildly prominent pulmonary vasculature. Mild diffuse peribronchial thickening. Stable right diaphragmatic eventration. Atheromatous arterial calcifications. Upper abdominal surgical clips. Diffuse osteopenia. Bilateral shoulder degenerative changes. IMPRESSION: 1. Mildly improved left basilar linear atelectasis and possible scarring. 2. Interval mild right basilar linear atelectasis. 3. Improved cardiomegaly. 4. Mild pulmonary  vascular congestion. 5. Mild bronchitic changes. Electronically Signed   By: Beckie Salts M.D.   On: 08/26/2015 07:22   Dg Chest Port 1 View  08/02/2015  CLINICAL DATA:  Severe bradycardia.  Generalized weakness. EXAM: PORTABLE CHEST 1 VIEW COMPARISON:  07/08/2015 FINDINGS: Cardiomegaly with calcification in the thoracic aorta. Pulmonary vascularity is normal. Slight atelectasis at the left lung base. Right lung is clear. No acute bone abnormality. IMPRESSION: New cardiomegaly. Aortic atherosclerosis. Slight atelectasis at the left lung base. Electronically Signed   By: Francene Boyers M.D.   On: 08/02/2015  16:37   Dg Foot 2 Views Right  08/19/2015  AP and lateral xrays right foot were taken. Midfoot arthritis noted.  Hammer toe digit.  HAV deformity 1st MPJ.  There is white bone in third toe with no evidence of cystic formation.  No fracture dislocation or tumor noted.  Bone density appears normal for patients age and sex.  Joint space appears normal.  No frank deformity.   Helane Gunther DPM   Lab Data:  CBC:  Recent Labs Lab 08/26/15 0630 08/27/15 0153  WBC 13.7* 15.7*  HGB 10.8* 11.3*  HCT 32.2* 34.4*  MCV 93.3 94.0  PLT 335 366   Basic Metabolic Panel:  Recent Labs Lab 08/26/15 0630 08/27/15 0153  NA 139 141  K 3.9 3.6  CL 105 107  CO2 24 20*  GLUCOSE 67 56*  BUN 17 20  CREATININE 1.99* 2.27*  CALCIUM 9.9 9.5   GFR: Estimated Creatinine Clearance: 16 mL/min (by C-G formula based on Cr of 2.27). Liver Function Tests:  Recent Labs Lab 08/26/15 0630  AST 37  ALT 27  ALKPHOS 118  BILITOT 0.5  PROT 6.6  ALBUMIN 3.4*    Recent Labs Lab 08/26/15 0630  LIPASE 18   No results for input(s): AMMONIA in the last 168 hours. Coagulation Profile: No results for input(s): INR, PROTIME in the last 168 hours. Cardiac Enzymes: No results for input(s): CKTOTAL, CKMB, CKMBINDEX, TROPONINI in the last 168 hours. BNP (last 3 results) No results for input(s): PROBNP in the  last 8760 hours. HbA1C: No results for input(s): HGBA1C in the last 72 hours. CBG: No results for input(s): GLUCAP in the last 168 hours. Lipid Profile: No results for input(s): CHOL, HDL, LDLCALC, TRIG, CHOLHDL, LDLDIRECT in the last 72 hours. Thyroid Function Tests:  Recent Labs  08/26/15 1615  TSH 84.851*   Anemia Panel: No results for input(s): VITAMINB12, FOLATE, FERRITIN, TIBC, IRON, RETICCTPCT in the last 72 hours. Urine analysis:    Component Value Date/Time   COLORURINE YELLOW 08/26/2015 0737   APPEARANCEUR CLEAR 08/26/2015 0737   LABSPEC 1.022 08/26/2015 0737   PHURINE 6.5 08/26/2015 0737   GLUCOSEU NEGATIVE 08/26/2015 0737   HGBUR NEGATIVE 08/26/2015 0737   BILIRUBINUR NEGATIVE 08/26/2015 0737   KETONESUR 15* 08/26/2015 0737   PROTEINUR 100* 08/26/2015 0737   UROBILINOGEN 0.2 01/27/2015 2308   NITRITE NEGATIVE 08/26/2015 0737   LEUKOCYTESUR NEGATIVE 08/26/2015 0737     RAI,RIPUDEEP M.D. [facility] 08/27/2015, 9:19 AM  Pager: [phone] Between 7am to 7pm - call Pager - [phone]  After 7pm go to [email or web address] - password [email or web address]  Call night coverage person covering after 7pm

## 2015-08-27 NOTE — Progress Notes (Signed)
[place] for heparin Indication: atrial fibrillation  No Known Allergies  Patient Measurements: Height: 5\' 5"  (165.1 cm) Weight: 150 lb 11.2 oz (68.357 kg) IBW/kg (Calculated) : 57 Heparin Dosing Weight: 68.4  Vital Signs: Temp: 98.4 F (36.9 C) (05/26 0539) Temp Source: Oral (05/26 0539) BP: 162/66 mmHg (05/26 0539) Pulse Rate: 82 (05/26 1407)  Labs:  Recent Labs  08/26/15 0630 08/26/15 1615 08/27/15 0153 08/27/15 0931  HGB 10.8*  --  11.3*  --   HCT 32.2*  --  34.4*  --   PLT 335  --  366  --   APTT  --  36 92* 126*  HEPARINUNFRC  --  0.80* 1.10*  --   CREATININE 1.99*  --  2.27*  --     Estimated Creatinine Clearance: 16 mL/min (by C-G formula based on Cr of 2.27).   Medical History: Past Medical History  Diagnosis Date  . Pneumonia   . Hypertension   . Small bowel obstruction ([place])     "I don't know how many" (01/11/2015)  . Ventral hernia with bowel obstruction   . Asthma   . Hyperlipidemia   . Stroke ([place])     "light one"  . COPD (chronic obstructive pulmonary disease) ([place])   . Hypothyroidism   . Type II diabetes mellitus ([place])   . Anemia     previous blood transfusions  . GERD (gastroesophageal reflux disease)   . History of stomach ulcers   . Arthritis     "all over"  . Renal insufficiency   . Depression     "light case"  . SIADH (syndrome of inappropriate ADH production) ([place])     [name] 01/10/2015  . Perforated gastric ulcer ([place])   . Gastric stenosis   . Paraesophageal hernia   . Bradycardia     topped BB and amiodarone decreased- junctional rhythm     Medications:  Scheduled:  . fluticasone  2 spray Each Nare Daily  . ipratropium-albuterol  3 mL Nebulization TID  . levothyroxine  25 mcg Intravenous Q breakfast  . metronidazole  500 mg Intravenous Q8H  . ondansetron  4 mg Intravenous BID  . pantoprazole (PROTONIX) IV  40 mg Intravenous Q24H    Assessment: 82 yo woman admitted 08/26/2015  with abdominal pain on apixaban PTA for hx AFlutter. CHADSVASc at least 5. Pharmacy consulted to dose heparin.  Last dose apixaban yesterday at unknown time. Will start on heparin gtt with no bolus and monitor both aPTT and anti-Xa until both correlate. Baseline aPTT and anti-Xa will be drawn prior to heparin initiation.  PTT came back a bit elevated this AM. Pt might need toe amputation today so heparin has been on hold.  Goal of Therapy:  Heparin level 0.3-0.7 units/ml aPTT 66-102 seconds Monitor platelets by anticoagulation protocol: Yes   Plan:   Heparin on hold for now for possible amputation F/u restart  Onnie Boer, PharmD Pager: [phone] 08/27/2015 2:23 PM  Addendum:  Ortho saw this PM and will observe for now. Ok to resume heparin for afib.  Plan  Resume heparin at 850 units/hr F/u with 8 hr PTT Daily PTT and HL  Onnie Boer, PharmD Pager: [phone] 08/27/2015 2:25 PM

## 2015-08-27 NOTE — Consult Note (Signed)
[facility] Consult: 8:58 AM 08/27/2015  LOS: 1 day    Referring Provider: Dr Tana Coast  Primary Care Physician:  Gildardo Cranker, DO Primary Gastroenterologist:  Dr. Henrene Pastor    Reason for Consultation:  Nausea, vomiting  HPI: Shannon [name] is a very pleasant 82 y.o. female. Type 2 DM. Chronic normocytic anemia. "Light stroke", not on anticoagulation. Hypothyroidism. CKD stage 3. SIADH S/p remote gastrojejunostomy likely for ulcer. 09/2013 surgery for ulcer associated perforation at [place] anastomosis involving resection and revision of GJ anastomosis along with incisional herniorrhaphy. 01/2014 admission with GOO and EGDs x 2 showing gastric bezoar. Recurrent bezoar diagnosed on CT 03/2014. 03/19/2014 EGD: > 500 cc retained fluid; tortuous, narrowed distal stomach but no clear stricture or ulcers and mucosa normal at stomach and duodenum; able to pass scope beyond stomach with minimal pressure. It was felt her distorted distal gastric anatomy was causing at least partial gastric outlet obstruction. 03/2016 UGI/SBFT: normal. Recurrent n/v, functional GOO 05/2014 , CT did not show bezoar. 05/22/14 esophagram: small paraesophageal hernia, barium trickled into duodenum. No contrast extravasation.  Reglan was added, NGT placed; Dr Carlean Purl felt this is/was functional issue related to prior vagotomy and was likely to recurr.  At GI ROV 06/10/14 pt doing well on purrees, Boost, careful eating rituals, Protonix 40 BID.  Reglan was discontinued due to potential harmful s/e.  01/2015 had 17 day admission with SBO. S/p 10/19/16ex lap with LOA.  4/3 - 4/9 admission for chest pain, odyno/dysphagia, Afib/RVR, non STEMI (demand ischemia), iatrogenic hyperthyroidism (TSH 0.11), hypoxic resp failure.  07/08/2015 inpt EGD, Dr Loletha Carrow:  candida esophagitis, pyloric stenosos, scope passed with moderate resistance, normal duodenum.  Rx: Diflucan x 21 days.  At 4/20 OV with Dr Scarlette Shorts, dysphagia improved.  Was to complete 21 days Diflucan and maintain BID Protonix, puree diet.   5/1 - 5/4 admission  for bradycardia, diastolic CHF, AKI, hyponatremia, hypothyroidism (TSH 83).  08/17/15 her podiatrist RXd Doxycycline for cellulitis and diabetic foot ulcer. This was debrided on 5/16 and again on 5/23 at [facility]   Now with several days of recurrent nausea, vomiting started 5/24 and several episodes green/bilious emesis yesterday.  Diarrhea with more watery than usual but small to moderate volume stools, lower abd pain for 2 days PTA.  The pain is not typical for her. Diarrhea decreased as of yesterday.  Not passing blood.  Readmitted 5/26.   Non-contrast CT abd/pelvis 08/26/15: gastric distention, gastritis, esophagitis, colitis (prox colon: edema/thickening most prominent at prox transverse), cystitis.  No evidence UTI.  WBCs 13.7 yest, 15.7 today.   Though on enteric precautions, C diff not ordered. Day 2 empiric IV Cipro and Flagyl.   + AKI.  Despite decreased dose of Synthroid, TSH remains elevated, to 84 c/w 83 on 5/1.   Pt completed the Diflucan and no dysphagia.  No fever, chills. + oliguria but no dysuria.      Past Medical History  Diagnosis Date  . Pneumonia   . Hypertension   . Small bowel obstruction ([place])     "I don't  know how many" (01/11/2015)  . Ventral hernia with bowel obstruction   . Asthma   . Hyperlipidemia   . Stroke ([place])     "light one"  . COPD (chronic obstructive pulmonary disease) ([place])   . Hypothyroidism   . Type II diabetes mellitus ([place])   . Anemia     previous blood transfusions  . GERD (gastroesophageal reflux disease)   . History of stomach ulcers   . Arthritis     "all over"  . Renal insufficiency   . Depression     "light case"  . SIADH (syndrome of inappropriate ADH production) ([place])      [name] 01/10/2015  . Perforated gastric ulcer ([place])   . Gastric stenosis   . Paraesophageal hernia   . Bradycardia     topped BB and amiodarone decreased- junctional rhythm     Past Surgical History  Procedure Laterality Date  . Tubal ligation    . Colectomy    . Hernia repair  2015  . Esophagogastroduodenoscopy N/A 01/19/2014    Procedure: ESOPHAGOGASTRODUODENOSCOPY (EGD);  Surgeon: Irene Shipper, MD;  Location: [facility];  Service: Endoscopy;  Laterality: N/A;  . Esophagogastroduodenoscopy N/A 01/20/2014    Procedure: ESOPHAGOGASTRODUODENOSCOPY (EGD);  Surgeon: Irene Shipper, MD;  Location: [facility];  Service: Endoscopy;  Laterality: N/A;  . Esophagogastroduodenoscopy N/A 03/19/2014    Procedure: ESOPHAGOGASTRODUODENOSCOPY (EGD);  Surgeon: Milus Banister, MD;  Location: [facility];  Service: Endoscopy;  Laterality: N/A;  . Cholecystectomy open    . Ventral hernia repair  2015    incarcerated ventral hernia ([facility] 09/2013)/notes 01/10/2015  . Cataract extraction w/ intraocular lens  implant, bilateral    . Gastrojejunostomy      hx/notes 01/10/2015  . Laparotomy N/A 01/20/2015    Procedure: EXPLORATORY LAPAROTOMY;  Surgeon: Coralie Keens, MD;  Location: [place];  Service: General;  Laterality: N/A;  . Lysis of adhesion N/A 01/20/2015    Procedure: LYSIS OF ADHESIONS < 1 HOUR;  Surgeon: Coralie Keens, MD;  Location: [place];  Service: General;  Laterality: N/A;  . Esophagogastroduodenoscopy N/A 07/08/2015    Procedure: ESOPHAGOGASTRODUODENOSCOPY (EGD);  Surgeon: Doran Stabler, MD;  Location: [facility] ENDOSCOPY;  Service: Endoscopy;  Laterality: N/A;    Prior to Admission medications   Medication Sig Start Date End Date Taking? Authorizing Provider  acetaminophen (TYLENOL) 325 MG tablet Take 325 mg by mouth every 6 (six) hours as needed for mild pain or fever.    Yes Historical Provider, MD  ADVAIR DISKUS 250-50 MCG/DOSE AEPB INHALE 1 PUFF INTO THE LUNGS 2 (TWO) TIMES DAILY AS  NEEDED FOR SHORTNESS OF BREATH 12/01/14  Yes Historical Provider, MD  albuterol (PROVENTIL HFA;VENTOLIN HFA) 108 (90 Base) MCG/ACT inhaler Inhale 2 puffs into the lungs every 6 (six) hours as needed for wheezing or shortness of breath. 06/04/15  Yes Gildardo Cranker, DO  amiodarone (PACERONE) 100 MG tablet Take 1 tablet (100 mg total) by mouth daily. 08/05/15  Yes Alexa Angela Burke, MD  amLODipine (NORVASC) 10 MG tablet TAKE 1 TABLET BY MOUTH DAILY FOR HYPERTENSION 08/09/15  Yes Gildardo Cranker, DO  apixaban (ELIQUIS) 2.5 MG TABS tablet Take 1 tablet (2.5 mg total) by mouth 2 (two) times daily. 08/11/15  Yes Gildardo Cranker, DO  b complex vitamins tablet Take 1 tablet by mouth daily.    Yes Historical Provider, MD  calcium-vitamin D (OSCAL WITH D) 500-200 MG-UNIT per tablet Take 1 tablet by mouth daily with breakfast.  Yes Historical Provider, MD  chlorhexidine (PERIDEX) 0.12 % solution Use as directed 15 mLs in the mouth or throat 2 (two) times daily.   Yes Historical Provider, MD  [employer] GAS RELIEF 80 MG chewable tablet CHEW 1 TABLET BY MOUTH 4 TIMES A DAY AS NEEDED FOR FLATULENCE 06/28/15  Yes Gildardo Cranker, DO  diclofenac sodium (VOLTAREN) 1 % GEL Apply 2 g topically daily as needed (for pain).    Yes Historical Provider, MD  doxycycline (VIBRA-TABS) 100 MG tablet Take 1 tablet (100 mg total) by mouth 2 (two) times daily. 08/17/15  Yes Gardiner Barefoot, DPM  feeding supplement (BOOST / RESOURCE BREEZE) LIQD Take 1 Container by mouth 3 (three) times daily between meals. 01/27/15  Yes Shanker Kristeen Mans, MD  fluticasone (FLONASE) 50 MCG/ACT nasal spray Place 2 sprays into both nostrils daily. 08/07/14  Yes Gildardo Cranker, DO  furosemide (LASIX) 20 MG tablet Take 1 tablet (20 mg total) by mouth daily. 07/23/15  Yes Almyra Deforest, PA  gabapentin (NEURONTIN) 300 MG capsule Take 2 caps po in AM and qhs, take 1 cap daily at [facility] 08/06/15  Yes Gildardo Cranker, DO  hydrALAZINE (APRESOLINE) 50 MG tablet Take 1 tablet (50 mg total) by mouth 3  (three) times daily. 08/10/15  Yes Isaiah Serge, NP  isosorbide mononitrate (IMDUR) 30 MG 24 hr tablet TAKE 1 TABLET (30 MG TOTAL) BY MOUTH DAILY. 06/22/15  Yes Historical Provider, MD  lactulose (CHRONULAC) 10 GM/15ML solution Take 15 mLs (10 g total) by mouth daily as needed for mild constipation. 06/02/14  Yes Mahima Pandey, MD  latanoprost (XALATAN) 0.005 % ophthalmic solution Place 1 drop into both eyes at bedtime. 06/02/14  Yes Mahima Bubba Camp, MD  levothyroxine (SYNTHROID, LEVOTHROID) 50 MCG tablet Take 1 tablet (50 mcg total) by mouth daily before breakfast. 08/05/15  Yes Alexa Angela Burke, MD  Multiple Vitamins-Minerals (MULTIVITAMIN WITH MINERALS) tablet Take 1 tablet by mouth every morning.    Yes Historical Provider, MD  pantoprazole (PROTONIX) 40 MG tablet TAKE 1 TABLET BY MOUTH TWICE A DAY 08/11/15  Yes Gildardo Cranker, DO  Polyethyl Glycol-Propyl Glycol 0.4-0.3 % SOLN Place 1 drop into both eyes 4 (four) times daily.    Yes Historical Provider, MD  polyethylene glycol (MIRALAX / GLYCOLAX) packet Take 17 g by mouth 2 (two) times daily.    Yes Historical Provider, MD  RESTASIS 0.05 % ophthalmic emulsion USE 1 DROP INTO BOTH EYES TWICE DAILY 10/19/14  Yes Gildardo Cranker, DO  sennosides-docusate sodium (SENOKOT-S) 8.6-50 MG tablet Take 1 tablet by mouth 2 (two) times daily.    Yes Historical Provider, MD  sodium chloride 1 g tablet TAKE 1 TABLET BY MOUTH THREE TIMES DAILY 05/19/15  Yes Gildardo Cranker, DO  traMADol (ULTRAM) 50 MG tablet Take 50 mg by mouth 2 (two) times daily as needed for moderate pain.    Yes Historical Provider, MD  atorvastatin (LIPITOR) 10 MG tablet Take one tablet by mouth once daily 08/26/15   Gildardo Cranker, DO  sertraline (ZOLOFT) 100 MG tablet Take one tablet by mouth once daily for depression 08/26/15   Gildardo Cranker, DO    Scheduled Meds: . ciprofloxacin  400 mg Intravenous Q24H  . fluticasone  2 spray Each Nare Daily  . ipratropium-albuterol  3 mL Nebulization QID  .  levothyroxine  25 mcg Intravenous Q breakfast  . metronidazole  500 mg Intravenous Q8H  . pantoprazole (PROTONIX) IV  40 mg Intravenous Q24H   Infusions: . sodium chloride  75 mL/hr at 08/26/15 2344  . heparin 950 Units/hr (08/26/15 1825)   PRN Meds: acetaminophen **OR** acetaminophen, albuterol, bisacodyl, hydrALAZINE, ondansetron **OR** ondansetron (ZOFRAN) IV   Allergies as of 08/26/2015  . (No Known Allergies)    Family History  Problem Relation Age of Onset  . Stroke Mother   . Heart attack Neg Hx   . Hypertension Mother   . Diabetes Brother     Social History   Social History  . Marital Status: Widowed    Spouse Name: N/A  . Number of Children: 3  . Years of Education: N/A   Occupational History  . Not on file.   Social History Main Topics  . Smoking status: Never Smoker   . Smokeless tobacco: Former Systems developer    Types: Snuff  . Alcohol Use: No  . Drug Use: No  . Sexual Activity: No   Other Topics Concern  . Not on file   Social History Narrative   Married in [place], lives in one story house with 2 people and no pets   Occupation: ?   Has a living Will, [place], and doesn't have DNR   Was living with Husband (in frail health) in [place] until 09/2013.  After her [facility] admission they both came to live with daughter in [place]: Constitutional:  No weight loss ENT:  No nose bleeds Pulm:  No SOB or cough CV:  No palpitations, no LE edema. No chest pain GU:  No hematuria, no frequency GI:  Per HPI Heme:  No unusual bleeding or bruising   Transfusions:  In past, none found in Epic.  Neuro:  No headaches, no peripheral tingling or numbness Derm:  No itching, no rash or sores.  Endocrine:  No sweats or chills.  No polyuria or dysuria Immunization:  Reviewed.   Travel:  None beyond local counties in last few months.    PHYSICAL EXAM: Vital signs in last 24 hours: Filed Vitals:   08/26/15 2242 08/27/15 0539  BP: 151/60 162/66    Pulse: 74 82  Temp:  98.4 F (36.9 C)  Resp: 20 18   Wt Readings from Last 3 Encounters:  08/26/15 68.357 kg (150 lb 11.2 oz)  08/10/15 67.495 kg (148 lb 12.8 oz)  08/06/15 66.951 kg (147 lb 9.6 oz)    General: pleasant, cooperative, comfortable Head:  No asymmetry or swelling   Eyes:  No icterus, no pallor Ears:  Not HOH  Nose:  No discharge Mouth:  Moist, clear, many missing teeth Neck:  No mass or JVD.  No goiter Lungs:  Clear bil.  No adventitious sounds, dyspnea or cough.  Voice is hoarse.  Heart: Irreg/Irreg.  Rate not slow or accelerated.   No MRG.  S1/S2 present Abdomen:  Soft, multiple ventral/incisional hernias.  BS hypoactive.  No tinkling/tympanitic sounds.   Rectal: deferred   Musc/Skeltl: kyphosis. Extremities:  No edema.  On right foot, 3rd toe is black, somewhat tender.  Neurologic:  Facial gesturing, lip smacking c/w tardive dyskinesia. Fully alert and oriented appropriately.  Moves all 4 limbs, no tremor.  Strength not tested.  Skin:  No telangectasia, rash or sores Psych:  Calm, pleasant, cooperative.   Intake/Output from previous day: 05/25 0701 - 05/26 0700 In: 727.5 [I.V.:527.5; IV Piggyback:200] Out: 500 [Emesis/NG output:500] Intake/Output this shift:    LAB RESULTS:  Recent Labs  08/26/15 0630 08/27/15 0153  WBC 13.7* 15.7*  HGB 10.8* 11.3*  HCT 32.2* 34.4*  PLT 335 366   BMET Lab Results  Component Value Date   NA 141 08/27/2015   NA 139 08/26/2015   NA 131* 08/10/2015   K 3.6 08/27/2015   K 3.9 08/26/2015   K 4.4 08/10/2015   CL 107 08/27/2015   CL 105 08/26/2015   CL 96* 08/10/2015   CO2 20* 08/27/2015   CO2 24 08/26/2015   CO2 25 08/10/2015   GLUCOSE 56* 08/27/2015   GLUCOSE 67 08/26/2015   GLUCOSE 95 08/10/2015   BUN 20 08/27/2015   BUN 17 08/26/2015   BUN 27* 08/10/2015   CREATININE 2.27* 08/27/2015   CREATININE 1.99* 08/26/2015   CREATININE 1.53* 08/10/2015   CALCIUM 9.5 08/27/2015   CALCIUM 9.9 08/26/2015    CALCIUM 9.5 08/10/2015   LFT  Recent Labs  08/26/15 0630  PROT 6.6  ALBUMIN 3.4*  AST 37  ALT 27  ALKPHOS 118  BILITOT 0.5   PT/INR Lab Results  Component Value Date   INR 1.24 08/02/2015   INR 1.20 01/29/2015   INR 1.11 01/20/2015   Hepatitis Panel No results for input(s): HEPBSAG, HCVAB, HEPAIGM, HEPBIGM in the last 72 hours. C-Diff No components found for: CDIFF Lipase     Component Value Date/Time   LIPASE 18 08/26/2015 0630    Drugs of Abuse  No results found for: LABOPIA, COCAINSCRNUR, LABBENZ, AMPHETMU, THCU, LABBARB   RADIOLOGY STUDIES: Ct Abdomen Pelvis Wo Contrast  08/26/2015  CLINICAL DATA:  Left upper quadrant pain with nausea and vomiting for 1 week. EXAM: CT ABDOMEN AND PELVIS WITHOUT CONTRAST TECHNIQUE: Multidetector CT imaging of the abdomen and pelvis was performed following the standard protocol without IV contrast. COMPARISON:  07/05/2015 FINDINGS: Lower chest and abdominal wall: Extensive ventral abdominal wall scarring with a fatty hernia left of midline, likely incisional. There is fluid within the thick walled lower esophagus which contains refluxed or non cleared fluid. Eventration of the right diaphragm. Hepatobiliary: Multiple hepatic low densities fairly circumscribed and likely cysts.Cholecystectomy. Pancreas: Unremarkable. Spleen: Unremarkable. Adrenals/Urinary Tract: Negative adrenals. No hydronephrosis or stone. Bladder wall thickening. Very Peristranding could be related to generalized edema or cystitis. Reproductive:No pathologic findings. Stomach/Bowel: The stomach is circumferentially thick walled and moderately distended. The antral region is particularly thickened and collapsed. Suture along the lower gastric body. No small bowel dilatation. There is a proximal enteroenteric anastomosis. The proximal colon shows wall thickening and pericolonic edema, greatest along the proximal transverse segment. Colonic inflammatory changes are fairly  extensive, more compatible with colitis and diverticulitis. No pneumatosis or pneumoperitoneum. Scattered colonic and ileal diverticulosis. Possible previous sigmoidectomy. Vascular/Lymphatic: No acute vascular Find.  No mass or adenopathy. Peritoneal: No ascites or pneumoperitoneum. Musculoskeletal: No acute abnormalities. Remote appearing T11 superior endplate compression fracture. Lumbar facet arthropathy with mild L5-S1 anterolisthesis. IMPRESSION: 1. Moderately distended stomach with thickened antral region. Patient has history of chronic impaired gastric emptying and recent EGD. 2. Signs of gastritis and esophagitis. 3. Colitis with nonspecific pattern. 4. Cystitis, correlate with urinalysis. Electronically Signed   By: Monte Fantasia M.D.   On: 08/26/2015 09:24   Dg Chest 2 View  08/26/2015  CLINICAL DATA:  Abdominal pain, nausea and vomiting. Gastroesophageal reflux. EXAM: CHEST  2 VIEW COMPARISON:  08/02/2015 and chest CT dated 07/07/2015. FINDINGS: Borderline enlarged cardiac silhouette with an interval decrease in size. Linear density at both lung bases. Mildly prominent pulmonary vasculature. Mild diffuse peribronchial thickening. Stable right diaphragmatic eventration. Atheromatous arterial calcifications. Upper abdominal surgical clips. Diffuse osteopenia. Bilateral shoulder  degenerative changes. IMPRESSION: 1. Mildly improved left basilar linear atelectasis and possible scarring. 2. Interval mild right basilar linear atelectasis. 3. Improved cardiomegaly. 4. Mild pulmonary vascular congestion. 5. Mild bronchitic changes. Electronically Signed   By: Claudie Revering M.D.   On: 08/26/2015 07:22    ENDOSCOPIC STUDIES: Per HPI  IMPRESSION:   *  N/V in pt with hx of same.  Complicated pt with multiple abdominal suregeries, hx SBOs, pyloric stenosis, functional GOO; candida esophagitis on latest 07/08/15 EGD.  Current CT raising ? Of colitis but reaffirms diagnosis of esophagitis, gastritis.  On  empiric cipro for ? Infectious colitis.  Recent Doxycycline raises ? Of C diff, do not see orders for stool studies.   *  Iatrogennic hypothyroidism.  TSH remains significantly elevated evan after dose reduction.   *  Recent non-STEMI attributed to demand ischemia in setting of hypothyroidism.   *  CT with sugg of cystitis but benign U/A is not conforming this   PLAN:     *  Send stool for C diff.  *  Given what looks like tardive dyskinesia, do not think adding Reglan is safe.    Azucena Freed  08/27/2015, 8:58 AM Pager: [phone]    ________________________________________________________________________  Velora Heckler GI MD note:  I personally examined the patient, reviewed the data and agree with the assessment and plan described above.  Complex UGI history with multiple abd surgeries, snug pylorus, ? Neurologic gastric dysfunction, now with more nausea/vomiting and loose stools.  Distended stomach, colon inflammation.  She's been on recent antibiotics for toe osteo that may require amputation eventually.  Stomach is pretty distended on CT, I think she will benefit from NG tube and I will order that.  Her colon is inflamed and she's been on antibiotics recently.  Clear risk for c. Diff and I'm going to change her antibiotics to treat c. Diff empirically (will stop the cipro for now, continue flagyl). I agree she has a bit of lip smacking and recommend against reglan, will d/c what has already been ordered.  Will order scheduled zofran twice daily.  Will follow along.   Owens Loffler, MD [facility] Pager [phone]

## 2015-08-27 NOTE — Progress Notes (Signed)
ANTICOAGULATION CONSULT NOTE  Pharmacy Consult for heparin Indication: atrial fibrillation  No Known Allergies  Patient Measurements: Height: 5\' 5"  (165.1 cm) Weight: 150 lb 11.2 oz (68.357 kg) IBW/kg (Calculated) : 57 Heparin Dosing Weight: 68.4  Vital Signs: Temp: 97.8 F (36.6 C) (05/25 1651) BP: 151/60 mmHg (05/25 2242) Pulse Rate: 74 (05/25 2242)  Labs:  Recent Labs  08/26/15 0630 08/26/15 1615 08/27/15 0153  HGB 10.8*  --  11.3*  HCT 32.2*  --  34.4*  PLT 335  --  366  APTT  --  36 92*  HEPARINUNFRC  --  0.80*  --   CREATININE 1.99*  --  2.27*    Estimated Creatinine Clearance: 16 mL/min (by C-G formula based on Cr of 2.27).   Medical History: Past Medical History  Diagnosis Date  . Pneumonia   . Hypertension   . Small bowel obstruction ([place])     "I don't know how many" (01/11/2015)  . Ventral hernia with bowel obstruction   . Asthma   . Hyperlipidemia   . Stroke ([place])     "light one"  . COPD (chronic obstructive pulmonary disease) ([place])   . Hypothyroidism   . Type II diabetes mellitus ([place])   . Anemia     previous blood transfusions  . GERD (gastroesophageal reflux disease)   . History of stomach ulcers   . Arthritis     "all over"  . Renal insufficiency   . Depression     "light case"  . SIADH (syndrome of inappropriate ADH production) ([place])     [name] 01/10/2015  . Perforated gastric ulcer ([place])   . Gastric stenosis   . Paraesophageal hernia   . Bradycardia     topped BB and amiodarone decreased- junctional rhythm     Medications:  Scheduled:  . ciprofloxacin  400 mg Intravenous Q24H  . fluticasone  2 spray Each Nare Daily  . ipratropium-albuterol  3 mL Nebulization QID  . levothyroxine  25 mcg Intravenous Q breakfast  . metronidazole  500 mg Intravenous Q8H  . pantoprazole (PROTONIX) IV  40 mg Intravenous Q24H    Assessment: 82 yo woman admitted 08/26/2015 with abdominal pain on apixaban PTA for hx AFlutter. CHADSVASc at least  5. Pharmacy consulted to dose heparin.  Last dose apixaban yesterday at unknown time. Will start on heparin gtt with no bolus and monitor both aPTT and anti-Xa until both correlate. Baseline aPTT and anti-Xa will be drawn prior to heparin initiation.  Initial aPTT is therapeutic at 92 sec on heparin 950 units/hr. No issues with infusion or bleeding noted.  Goal of Therapy:  Heparin level 0.3-0.7 units/ml aPTT 66-102 seconds Monitor platelets by anticoagulation protocol: Yes   Plan:  Continue Heparin 950 units/h 8h confirmatory aPTT Daily aPTT/heparin level, CBC Monitor s/sx bleeding    Andrey Cota. Diona Foley, PharmD, BCPS Clinical Pharmacist Pager [phone]  08/27/2015,3:18 AM

## 2015-08-27 NOTE — Consult Note (Addendum)
Reason for Consult:  Right third toe ulcer  Referring Physician: Dr. Laurette Schimke is an 82 y.o. female.  HPI: 82 y/o female with PMH of diabetes and a fib c/o a R third toe ulcer since early May.  She is admitted currently for GI problems.  She was noted to have a dark appearance of the right third toe.  She recently saw Dr. Prudence Davidson twice and took a week course of doxycycline for this ulcer.  She says it was draining back then but stopped after the antibiotics.  She denies any pain in the toe but admits to a h/o peripheral neuropathy.  She denies any recent f/c/n/v or drainage from the toe.  No recent surgery on the toe.  She has never seen a vascular surgeon that she can recall.  She doesn't smoke.  Diabetes is controlled with diet.  She lives with her daughter who is at the bedside.  She is on flagyl and cipro.  Past Medical History  Diagnosis Date  . Pneumonia   . Hypertension   . Small bowel obstruction ([place])     "I don't know how many" (01/11/2015)  . Ventral hernia with bowel obstruction   . Asthma   . Hyperlipidemia   . Stroke ([place])     "light one"  . COPD (chronic obstructive pulmonary disease) ([place])   . Hypothyroidism   . Type II diabetes mellitus ([place])   . Anemia     previous blood transfusions  . GERD (gastroesophageal reflux disease)   . History of stomach ulcers   . Arthritis     "all over"  . Renal insufficiency   . Depression     "light case"  . SIADH (syndrome of inappropriate ADH production) ([place])     [name] 01/10/2015  . Perforated gastric ulcer ([place])   . Gastric stenosis   . Paraesophageal hernia   . Bradycardia     topped BB and amiodarone decreased- junctional rhythm     Past Surgical History  Procedure Laterality Date  . Tubal ligation    . Colectomy    . Hernia repair  2015  . Esophagogastroduodenoscopy N/A 01/19/2014    Procedure: ESOPHAGOGASTRODUODENOSCOPY (EGD);  Surgeon: Irene Shipper, MD;  Location: [facility];  Service: Endoscopy;   Laterality: N/A;  . Esophagogastroduodenoscopy N/A 01/20/2014    Procedure: ESOPHAGOGASTRODUODENOSCOPY (EGD);  Surgeon: Irene Shipper, MD;  Location: [facility];  Service: Endoscopy;  Laterality: N/A;  . Esophagogastroduodenoscopy N/A 03/19/2014    Procedure: ESOPHAGOGASTRODUODENOSCOPY (EGD);  Surgeon: Milus Banister, MD;  Location: [facility];  Service: Endoscopy;  Laterality: N/A;  . Cholecystectomy open    . Ventral hernia repair  2015    incarcerated ventral hernia ([facility] 09/2013)/notes 01/10/2015  . Cataract extraction w/ intraocular lens  implant, bilateral    . Gastrojejunostomy      hx/notes 01/10/2015  . Laparotomy N/A 01/20/2015    Procedure: EXPLORATORY LAPAROTOMY;  Surgeon: Coralie Keens, MD;  Location: [facility];  Service: General;  Laterality: N/A;  . Lysis of adhesion N/A 01/20/2015    Procedure: LYSIS OF ADHESIONS < 1 HOUR;  Surgeon: Coralie Keens, MD;  Location: [place];  Service: General;  Laterality: N/A;  . Esophagogastroduodenoscopy N/A 07/08/2015    Procedure: ESOPHAGOGASTRODUODENOSCOPY (EGD);  Surgeon: Doran Stabler, MD;  Location: [facility];  Service: Endoscopy;  Laterality: N/A;    Family History  Problem Relation Age of Onset  . Stroke Mother   . Heart attack Neg Hx   .  Hypertension Mother   . Diabetes Brother     Social History:  reports that she has never smoked. She has quit using smokeless tobacco. Her smokeless tobacco use included Snuff. She reports that she does not drink alcohol or use illicit drugs.  Allergies: No Known Allergies  Medications: I have reviewed the patient's current medications.  Results for orders placed or performed during the hospital encounter of 08/26/15 (from the past 48 hour(s))  Comprehensive metabolic panel     Status: Abnormal   Collection Time: 08/26/15  6:30 AM  Result Value Ref Range   Sodium 139 135 - 145 mmol/L   Potassium 3.9 3.5 - 5.1 mmol/L   Chloride 105 101 - 111 mmol/L   CO2 24 22 - 32 mmol/L    Glucose, Bld 67 65 - 99 mg/dL   BUN 17 6 - 20 mg/dL   Creatinine, Ser 1.99 (H) 0.44 - 1.00 mg/dL   Calcium 9.9 8.9 - 10.3 mg/dL   Total Protein 6.6 6.5 - 8.1 g/dL   Albumin 3.4 (L) 3.5 - 5.0 g/dL   AST 37 15 - 41 U/L   ALT 27 14 - 54 U/L   Alkaline Phosphatase 118 38 - 126 U/L   Total Bilirubin 0.5 0.3 - 1.2 mg/dL   GFR calc non Af Amer 21 (L) >60 mL/min   GFR calc Af Amer 24 (L) >60 mL/min    Comment: (NOTE) The eGFR has been calculated using the CKD EPI equation. This calculation has not been validated in all clinical situations. eGFR's persistently <60 mL/min signify possible Chronic Kidney Disease.    Anion gap 10 5 - 15  CBC     Status: Abnormal   Collection Time: 08/26/15  6:30 AM  Result Value Ref Range   WBC 13.7 (H) 4.0 - 10.5 K/uL   RBC 3.45 (L) 3.87 - 5.11 MIL/uL   Hemoglobin 10.8 (L) 12.0 - 15.0 g/dL   HCT 32.2 (L) 36.0 - 46.0 %   MCV 93.3 78.0 - 100.0 fL   MCH 31.3 26.0 - 34.0 pg   MCHC 33.5 30.0 - 36.0 g/dL   RDW 17.5 (H) 11.5 - 15.5 %   Platelets 335 150 - 400 K/uL  Lipase, blood     Status: None   Collection Time: 08/26/15  6:30 AM  Result Value Ref Range   Lipase 18 11 - 51 U/L  Brain natriuretic peptide     Status: None   Collection Time: 08/26/15  6:30 AM  Result Value Ref Range   B Natriuretic Peptide 39.0 0.0 - 100.0 pg/mL  I-stat troponin, ED     Status: None   Collection Time: 08/26/15  6:36 AM  Result Value Ref Range   Troponin i, poc 0.02 0.00 - 0.08 ng/mL   Comment 3            Comment: Due to the release kinetics of cTnI, a negative result within the first hours of the onset of symptoms does not rule out myocardial infarction with certainty. If myocardial infarction is still suspected, repeat the test at appropriate intervals.   Urinalysis, Routine w reflex microscopic     Status: Abnormal   Collection Time: 08/26/15  7:37 AM  Result Value Ref Range   Color, Urine YELLOW YELLOW   APPearance CLEAR CLEAR   Specific Gravity, Urine 1.022  1.005 - 1.030   pH 6.5 5.0 - 8.0   Glucose, UA NEGATIVE NEGATIVE mg/dL   Hgb urine dipstick NEGATIVE NEGATIVE  Bilirubin Urine NEGATIVE NEGATIVE   Ketones, ur 15 (A) NEGATIVE mg/dL   Protein, ur 100 (A) NEGATIVE mg/dL   Nitrite NEGATIVE NEGATIVE   Leukocytes, UA NEGATIVE NEGATIVE  Urine culture     Status: None   Collection Time: 08/26/15  7:37 AM  Result Value Ref Range   Specimen Description URINE, CATHETERIZED    Special Requests NONE    Culture NO GROWTH    Report Status 08/27/2015 FINAL   Urine microscopic-add on     Status: Abnormal   Collection Time: 08/26/15  7:37 AM  Result Value Ref Range   Squamous Epithelial / LPF NONE SEEN NONE SEEN   WBC, UA 0-5 0 - 5 WBC/hpf   RBC / HPF 0-5 0 - 5 RBC/hpf   Bacteria, UA RARE (A) NONE SEEN   Casts HYALINE CASTS (A) NEGATIVE  TSH     Status: Abnormal   Collection Time: 08/26/15  4:15 PM  Result Value Ref Range   TSH 84.851 (H) 0.350 - 4.500 uIU/mL  APTT     Status: None   Collection Time: 08/26/15  4:15 PM  Result Value Ref Range   aPTT 36 24 - 37 seconds  Heparin level (unfractionated)     Status: Abnormal   Collection Time: 08/26/15  4:15 PM  Result Value Ref Range   Heparin Unfractionated 0.80 (H) 0.30 - 0.70 IU/mL    Comment:        IF HEPARIN RESULTS ARE BELOW EXPECTED VALUES, AND PATIENT DOSAGE HAS BEEN CONFIRMED, SUGGEST FOLLOW UP TESTING OF ANTITHROMBIN III LEVELS.   APTT     Status: Abnormal   Collection Time: 08/27/15  1:53 AM  Result Value Ref Range   aPTT 92 (H) 24 - 37 seconds    Comment:        IF BASELINE aPTT IS ELEVATED, SUGGEST PATIENT RISK ASSESSMENT BE USED TO DETERMINE APPROPRIATE ANTICOAGULANT THERAPY.   Basic metabolic panel     Status: Abnormal   Collection Time: 08/27/15  1:53 AM  Result Value Ref Range   Sodium 141 135 - 145 mmol/L   Potassium 3.6 3.5 - 5.1 mmol/L   Chloride 107 101 - 111 mmol/L   CO2 20 (L) 22 - 32 mmol/L   Glucose, Bld 56 (L) 65 - 99 mg/dL   BUN 20 6 - 20 mg/dL    Creatinine, Ser 2.27 (H) 0.44 - 1.00 mg/dL   Calcium 9.5 8.9 - 10.3 mg/dL   GFR calc non Af Amer 18 (L) >60 mL/min   GFR calc Af Amer 21 (L) >60 mL/min    Comment: (NOTE) The eGFR has been calculated using the CKD EPI equation. This calculation has not been validated in all clinical situations. eGFR's persistently <60 mL/min signify possible Chronic Kidney Disease.    Anion gap 14 5 - 15  CBC     Status: Abnormal   Collection Time: 08/27/15  1:53 AM  Result Value Ref Range   WBC 15.7 (H) 4.0 - 10.5 K/uL   RBC 3.66 (L) 3.87 - 5.11 MIL/uL   Hemoglobin 11.3 (L) 12.0 - 15.0 g/dL   HCT 34.4 (L) 36.0 - 46.0 %   MCV 94.0 78.0 - 100.0 fL   MCH 30.9 26.0 - 34.0 pg   MCHC 32.8 30.0 - 36.0 g/dL   RDW 17.6 (H) 11.5 - 15.5 %   Platelets 366 150 - 400 K/uL  Heparin level (unfractionated)     Status: Abnormal   Collection Time: 08/27/15  1:53 AM  Result Value Ref Range   Heparin Unfractionated 1.10 (H) 0.30 - 0.70 IU/mL    Comment: RESULTS CONFIRMED BY MANUAL DILUTION        IF HEPARIN RESULTS ARE BELOW EXPECTED VALUES, AND PATIENT DOSAGE HAS BEEN CONFIRMED, SUGGEST FOLLOW UP TESTING OF ANTITHROMBIN III LEVELS.   APTT     Status: Abnormal   Collection Time: 08/27/15  9:31 AM  Result Value Ref Range   aPTT 126 (H) 24 - 37 seconds    Comment:        IF BASELINE aPTT IS ELEVATED, SUGGEST PATIENT RISK ASSESSMENT BE USED TO DETERMINE APPROPRIATE ANTICOAGULANT THERAPY.     Xrays:  Foot films from Dr. Neomia Dear office don't show the distal phalanx of the right third toe well.  ROS:   PE:  Blood pressure 162/66, pulse 82, temperature 98.4 F (36.9 C), temperature source Oral, resp. rate 18, height 5' 5" (1.651 m), weight 68.357 kg (150 lb 11.2 oz), SpO2 97 %. Elderly female in nad.  A and O x 4.  Mood and affect normal.  EOMI.  resp unlabored.  R foot with mild bunion deformity.  Skin thin and dry.  Small ulcer at the tip of the third toe.  No palpable pulses.  Cap refill < 3 sec at the  toes.  Third toe is swollen and dusky.  Sens to LT is diminished at the forefoot.  5/5 strength in PF and DF of the ankle and toes.    Assessment/Plan: R third toe diabetic ulcer and peripheral vascular disease.  I explained the nature of the condition to the patient and her daughter in detail.  At this point I'd recommend repeat xrays of the foot to eval for signs of osteomyelitis of the 3rd toe at the site of the ulcer.  Vascular consultation should be considered to see if revascularization is a possibility.  I believe the toe is at high risk for needing amputation.  In the meantime, we'll observe for progression.  ,  08/27/2015, 1:48 PM   xrays suggestive of osteomyelitis of the R third toe.  MRI ordered.

## 2015-08-27 NOTE — Consult Note (Signed)
[facility] CM Inpatient Consult   08/27/2015  Emara Schrom 11/27/1923 [id number]  Referral received to assess for care management services.    Met with the patient and daughter, [name] regarding the benefits of [facility] Care Management services. Explained that [facility] Care Management is a covered benefit of insurance. Review information for [facility] Care Management and a brochure was provided with contact information.  Explained that [facility] Care Management does not interfere with or replace any services arranged by the inpatient care management staff.  Patient declined services with [facility] Care Management stating, "I don't think I need anything else right now."  Patient and family did receive a brochure.  For questions, please contact:  Charlesetta Shanks, RN BSN CCM [facility]  [phone] business mobile phone Toll free office [phone]

## 2015-08-27 NOTE — Anesthesia Preprocedure Evaluation (Deleted)
Anesthesia Evaluation    Airway        Dental   Pulmonary shortness of breath and with exertion, asthma , pneumonia, resolved, COPD,  COPD inhaler,           Cardiovascular hypertension, Pt. on medications + Peripheral Vascular Disease  + dysrhythmias Atrial Fibrillation   Carotid stenosis LVEF 60-65%   Neuro/Psych PSYCHIATRIC DISORDERS Depression  Neuromuscular disease CVA, Residual Symptoms    GI/Hepatic PUD, GERD  Medicated and Controlled,  Endo/Other  diabetes, Well Controlled, Type 2Hypothyroidism SIADH Hyperlipidemia  Renal/GU Renal InsufficiencyRenal disease  negative genitourinary   Musculoskeletal  (+) Arthritis , Right 3rd toe gangrene   Abdominal   Peds  Hematology  (+) anemia ,   Anesthesia Other Findings   Reproductive/Obstetrics                          Anesthesia Physical Anesthesia Plan  ASA: IV  Anesthesia Plan: General   Post-op Pain Management:    Induction: Intravenous  Airway Management Planned: LMA  Additional Equipment:   Intra-op Plan:   Post-operative Plan: Extubation in OR  Informed Consent: I have reviewed the patients History and Physical, chart, labs and discussed the procedure including the risks, benefits and alternatives for the proposed anesthesia with the patient or authorized representative who has indicated his/her understanding and acceptance.   Dental advisory given  Plan Discussed with: Anesthesiologist, CRNA and Surgeon  Anesthesia Plan Comments:        Anesthesia Quick Evaluation

## 2015-08-27 NOTE — Progress Notes (Signed)
ANTICOAGULATION CONSULT NOTE  Pharmacy Consult for heparin Indication: atrial fibrillation  No Known Allergies  Patient Measurements: Height: 5\' 5"  (165.1 cm) Weight: 150 lb 11.2 oz (68.357 kg) IBW/kg (Calculated) : 57 Heparin Dosing Weight: 68.4  Vital Signs: Temp: 98.2 F (36.8 C) (05/26 2050) Temp Source: Oral (05/26 2050) BP: 148/59 mmHg (05/26 2050) Pulse Rate: 75 (05/26 2050)  Labs:  Recent Labs  08/26/15 0630  08/26/15 1615 08/27/15 0153 08/27/15 0931 08/27/15 2249  HGB 10.8*  --   --  11.3*  --   --   HCT 32.2*  --   --  34.4*  --   --   PLT 335  --   --  366  --   --   APTT  --   < > 36 92* 126* 102*  HEPARINUNFRC  --   --  0.80* 1.10*  --   --   CREATININE 1.99*  --   --  2.27*  --   --   < > = values in this interval not displayed.  Estimated Creatinine Clearance: 16 mL/min (by C-G formula based on Cr of 2.27).   Medical History: Past Medical History  Diagnosis Date  . Pneumonia   . Hypertension   . Small bowel obstruction ([place])     "I don't know how many" (01/11/2015)  . Ventral hernia with bowel obstruction   . Asthma   . Hyperlipidemia   . Stroke ([place])     "light one"  . COPD (chronic obstructive pulmonary disease) ([place])   . Hypothyroidism   . Type II diabetes mellitus ([place])   . Anemia     previous blood transfusions  . GERD (gastroesophageal reflux disease)   . History of stomach ulcers   . Arthritis     "all over"  . Renal insufficiency   . Depression     "light case"  . SIADH (syndrome of inappropriate ADH production) ([place])     [name] 01/10/2015  . Perforated gastric ulcer ([place])   . Gastric stenosis   . Paraesophageal hernia   . Bradycardia     topped BB and amiodarone decreased- junctional rhythm     Medications:  Scheduled:  . fluticasone  2 spray Each Nare Daily  . ipratropium-albuterol  3 mL Nebulization TID  . levothyroxine  25 mcg Intravenous Q breakfast  . metronidazole  500 mg Intravenous Q8H  . ondansetron  4 mg  Intravenous BID  . pantoprazole (PROTONIX) IV  40 mg Intravenous Q24H    Assessment: 82 yo woman admitted 08/26/2015 with abdominal pain on apixaban PTA for hx AFlutter. CHADSVASc at least 5. Pharmacy consulted to dose heparin.  Last dose apixaban yesterday at unknown time. Will start on heparin gtt with no bolus and monitor both aPTT and anti-Xa until both correlate. Baseline aPTT and anti-Xa will be drawn prior to heparin initiation.  Initial aPTT after restarting heparin is at the upper limit of therapeutic at 102 sec on heparin 850 units/hr. Nurse reports no issues with infusion or bleeding. Will reduce slightly to keep from going supratherapeutic.  Goal of Therapy:  Heparin level 0.3-0.7 units/ml aPTT 66-102 seconds Monitor platelets by anticoagulation protocol: Yes   Plan:  Reduce heparin to 800 units/hr 8h aPTT/HL Daily aPTT/HL Monitor s/sx of bleeding  Andrey Cota. Diona Foley, PharmD, [place] Clinical Pharmacist Pager [phone] 08/27/2015 11:44 PM

## 2015-08-28 ENCOUNTER — Inpatient Hospital Stay (HOSPITAL_COMMUNITY): Payer: Medicare Other

## 2015-08-28 DIAGNOSIS — I96 Gangrene, not elsewhere classified: Secondary | ICD-10-CM

## 2015-08-28 DIAGNOSIS — E11621 Type 2 diabetes mellitus with foot ulcer: Secondary | ICD-10-CM | POA: Insufficient documentation

## 2015-08-28 DIAGNOSIS — L97513 Non-pressure chronic ulcer of other part of right foot with necrosis of muscle: Secondary | ICD-10-CM

## 2015-08-28 DIAGNOSIS — K5732 Diverticulitis of large intestine without perforation or abscess without bleeding: Secondary | ICD-10-CM | POA: Insufficient documentation

## 2015-08-28 LAB — CBC
HCT: 31.3 % — ABNORMAL LOW (ref 36.0–46.0)
Hemoglobin: 10.3 g/dL — ABNORMAL LOW (ref 12.0–15.0)
MCH: 31 pg (ref 26.0–34.0)
MCHC: 32.9 g/dL (ref 30.0–36.0)
MCV: 94.3 fL (ref 78.0–100.0)
Platelets: 307 10*3/uL (ref 150–400)
RBC: 3.32 MIL/uL — ABNORMAL LOW (ref 3.87–5.11)
RDW: 17.8 % — ABNORMAL HIGH (ref 11.5–15.5)
WBC: 17.9 10*3/uL — ABNORMAL HIGH (ref 4.0–10.5)

## 2015-08-28 LAB — GLUCOSE, CAPILLARY
Glucose-Capillary: 61 mg/dL — ABNORMAL LOW (ref 65–99)
Glucose-Capillary: 61 mg/dL — ABNORMAL LOW (ref 65–99)
Glucose-Capillary: 62 mg/dL — ABNORMAL LOW (ref 65–99)
Glucose-Capillary: 71 mg/dL (ref 65–99)
Glucose-Capillary: 77 mg/dL (ref 65–99)
Glucose-Capillary: 64 mg/dL — ABNORMAL LOW (ref 65–99)
Glucose-Capillary: 131 mg/dL — ABNORMAL HIGH (ref 65–99)
Glucose-Capillary: 66 mg/dL (ref 65–99)

## 2015-08-28 LAB — BASIC METABOLIC PANEL
Anion gap: 10 (ref 5–15)
BUN: 17 mg/dL (ref 6–20)
CO2: 20 mmol/L — ABNORMAL LOW (ref 22–32)
Calcium: 8.6 mg/dL — ABNORMAL LOW (ref 8.9–10.3)
Chloride: 110 mmol/L (ref 101–111)
Creatinine, Ser: 2 mg/dL — ABNORMAL HIGH (ref 0.44–1.00)
GFR calc Af Amer: 24 mL/min — ABNORMAL LOW (ref >60)
GFR calc non Af Amer: 21 mL/min — ABNORMAL LOW (ref >60)
Glucose, Bld: 71 mg/dL (ref 65–99)
Potassium: 3.6 mmol/L (ref 3.5–5.1)
Sodium: 140 mmol/L (ref 135–145)

## 2015-08-28 LAB — C DIFFICILE QUICK SCREEN W PCR REFLEX
C Diff antigen: NEGATIVE
C Diff interpretation: NEGATIVE
C Diff toxin: NEGATIVE

## 2015-08-28 LAB — HEPARIN LEVEL (UNFRACTIONATED): Heparin Unfractionated: 1.04 IU/mL — ABNORMAL HIGH (ref 0.30–0.70)

## 2015-08-28 LAB — APTT: aPTT: 95 seconds — ABNORMAL HIGH (ref 24–37)

## 2015-08-28 MED ORDER — DEXTROSE 50 % IV SOLN
25.0000 mL | Freq: Once | INTRAVENOUS | Status: AC
  Administered 2015-08-28: 25 mL via INTRAVENOUS

## 2015-08-28 MED ORDER — SERTRALINE HCL 100 MG PO TABS
100.0000 mg | ORAL_TABLET | Freq: Every day | ORAL | Status: DC
  Administered 2015-08-28 – 2015-09-05 (×9): 100 mg via ORAL
  Filled 2015-08-28 (×8): qty 1

## 2015-08-28 MED ORDER — VANCOMYCIN HCL IN DEXTROSE 1-5 GM/200ML-% IV SOLN: 1000.0000 mg | INTRAVENOUS | Status: DC

## 2015-08-28 MED ORDER — GLUCOSE 40 % PO GEL: 1.0000 | Freq: Once | ORAL | Status: DC

## 2015-08-28 MED ORDER — VANCOMYCIN HCL 10 G IV SOLR
1250.0000 mg | Freq: Once | INTRAVENOUS | Status: AC
  Administered 2015-08-28: 1250 mg via INTRAVENOUS
  Filled 2015-08-28: qty 1250

## 2015-08-28 MED ORDER — POLYETHYLENE GLYCOL 3350 17 G PO PACK
17.0000 g | PACK | Freq: Two times a day (BID) | ORAL | Status: DC
  Administered 2015-08-28 – 2015-08-29 (×2): 17 g via ORAL
  Filled 2015-08-28 (×4): qty 1

## 2015-08-28 MED ORDER — AMLODIPINE BESYLATE 10 MG PO TABS
10.0000 mg | ORAL_TABLET | Freq: Every day | ORAL | Status: DC
  Administered 2015-08-28 – 2015-09-05 (×9): 10 mg via ORAL
  Filled 2015-08-28 (×9): qty 1

## 2015-08-28 MED ORDER — LATANOPROST 0.005 % OP SOLN
1.0000 [drp] | Freq: Every day | OPHTHALMIC | Status: DC
  Administered 2015-08-28 – 2015-09-04 (×8): 1 [drp] via OPHTHALMIC
  Filled 2015-08-28 (×3): qty 2.5

## 2015-08-28 MED ORDER — LEVOTHYROXINE SODIUM 100 MCG IV SOLR
44.0000 ug | Freq: Every day | INTRAVENOUS | Status: DC
  Administered 2015-08-28 – 2015-08-29 (×2): 44 ug via INTRAVENOUS
  Filled 2015-08-28 (×2): qty 5

## 2015-08-28 MED ORDER — INSULIN ASPART 100 UNIT/ML ~~LOC~~ SOLN: 0.0000 [IU] | Freq: Three times a day (TID) | SUBCUTANEOUS | Status: DC

## 2015-08-28 MED ORDER — CYCLOSPORINE 0.05 % OP EMUL
1.0000 [drp] | Freq: Two times a day (BID) | OPHTHALMIC | Status: DC
  Administered 2015-08-28 – 2015-09-05 (×17): 1 [drp] via OPHTHALMIC
  Filled 2015-08-28 (×17): qty 1

## 2015-08-28 MED ORDER — AMIODARONE HCL 200 MG PO TABS
100.0000 mg | ORAL_TABLET | Freq: Every day | ORAL | Status: DC
  Administered 2015-08-28 – 2015-09-05 (×9): 100 mg via ORAL
  Filled 2015-08-28 (×9): qty 1

## 2015-08-28 MED ORDER — SENNOSIDES-DOCUSATE SODIUM 8.6-50 MG PO TABS
1.0000 | ORAL_TABLET | Freq: Two times a day (BID) | ORAL | Status: DC
  Administered 2015-08-28 – 2015-08-29 (×3): 1 via ORAL
  Filled 2015-08-28 (×4): qty 1

## 2015-08-28 MED ORDER — GLUCOSE 40 % PO GEL
1.0000 | Freq: Once | ORAL | Status: DC
  Filled 2015-08-28: qty 1

## 2015-08-28 MED ORDER — GLUCOSE 40 % PO GEL
ORAL | Status: AC
  Administered 2015-08-28: 37.5 g
  Filled 2015-08-28: qty 1

## 2015-08-28 MED ORDER — DEXTROSE 50 % IV SOLN
INTRAVENOUS | Status: AC
  Administered 2015-08-28: 23:00:00
  Filled 2015-08-28: qty 50

## 2015-08-28 MED ORDER — SIMETHICONE 80 MG PO CHEW: 80.0000 mg | CHEWABLE_TABLET | Freq: Four times a day (QID) | ORAL | Status: DC | PRN

## 2015-08-28 MED ORDER — CALCIUM CARBONATE-VITAMIN D 500-200 MG-UNIT PO TABS
1.0000 | ORAL_TABLET | Freq: Every day | ORAL | Status: DC
  Administered 2015-08-29 – 2015-09-05 (×7): 1 via ORAL
  Filled 2015-08-28 (×8): qty 1

## 2015-08-28 MED ORDER — INSULIN ASPART 100 UNIT/ML ~~LOC~~ SOLN: 0.0000 [IU] | Freq: Every day | SUBCUTANEOUS | Status: DC

## 2015-08-28 MED ORDER — LACTULOSE 10 GM/15ML PO SOLN
10.0000 g | Freq: Every day | ORAL | Status: DC | PRN
  Administered 2015-09-04: 10 g via ORAL
  Filled 2015-08-28: qty 15

## 2015-08-28 MED ORDER — ATORVASTATIN CALCIUM 10 MG PO TABS
10.0000 mg | ORAL_TABLET | Freq: Every day | ORAL | Status: DC
  Administered 2015-08-28 – 2015-09-04 (×8): 10 mg via ORAL
  Filled 2015-08-28 (×8): qty 1

## 2015-08-28 MED ORDER — SODIUM CHLORIDE 1 G PO TABS: 1.0000 g | ORAL_TABLET | Freq: Three times a day (TID) | ORAL | Status: DC

## 2015-08-28 MED ORDER — POLYETHYL GLYCOL-PROPYL GLYCOL 0.4-0.3 % OP SOLN: 1.0000 [drp] | Freq: Four times a day (QID) | OPHTHALMIC | Status: DC

## 2015-08-28 MED ORDER — DEXTROSE-NACL 5-0.45 % IV SOLN
INTRAVENOUS | Status: DC
  Administered 2015-08-28 – 2015-08-29 (×2): via INTRAVENOUS

## 2015-08-28 MED ORDER — GABAPENTIN 300 MG PO CAPS
300.0000 mg | ORAL_CAPSULE | Freq: Two times a day (BID) | ORAL | Status: DC
  Administered 2015-08-28 – 2015-09-05 (×17): 300 mg via ORAL
  Filled 2015-08-28 (×17): qty 1

## 2015-08-28 MED ORDER — POLYVINYL ALCOHOL 1.4 % OP SOLN
1.0000 [drp] | Freq: Four times a day (QID) | OPHTHALMIC | Status: DC
  Administered 2015-08-28 – 2015-09-05 (×31): 1 [drp] via OPHTHALMIC
  Filled 2015-08-28: qty 15

## 2015-08-28 NOTE — Consult Note (Signed)
Patient name: Shannon [name] MRN: [id number] DOB: 19-Feb-1924 Sex: female   Referred by: [facility] hospitalist  Reason for referral:  Chief Complaint  Patient presents with  . Abdominal Pain  Gangrenous changes right third toe  HISTORY OF PRESENT ILLNESS: Patient very pleasant alert 82 year old female admitted with minimal pain and gastritis. Has had a ongoing issue regarding her right third toe. Has seen her podiatrist as an outpatient on several occasions and has had the course of oral antibiotics. She was scheduled to see the wound center. We were asked to see her during this admission due to the right third toe. Was also consult by orthopedics who recommended right toe amputation.  Past Medical History  Diagnosis Date  . Pneumonia   . Hypertension   . Small bowel obstruction (HCC)     "I don't know how many" (01/11/2015)  . Ventral hernia with bowel obstruction   . Asthma   . Hyperlipidemia   . Stroke (HCC)     "light one"  . COPD (chronic obstructive pulmonary disease) (HCC)   . Hypothyroidism   . Type II diabetes mellitus (HCC)   . Anemia     previous blood transfusions  . GERD (gastroesophageal reflux disease)   . History of stomach ulcers   . Arthritis     "all over"  . Renal insufficiency   . Depression     "light case"  . SIADH (syndrome of inappropriate ADH production) (HCC)     [name] 01/10/2015  . Perforated gastric ulcer (HCC)   . Gastric stenosis   . Paraesophageal hernia   . Bradycardia     topped BB and amiodarone decreased- junctional rhythm     Past Surgical History  Procedure Laterality Date  . Tubal ligation    . Colectomy    . Hernia repair  2015  . Esophagogastroduodenoscopy N/A 01/19/2014    Procedure: ESOPHAGOGASTRODUODENOSCOPY (EGD);  Surgeon: Hilarie Fredrickson, MD;  Location: [facility];  Service: Endoscopy;  Laterality: N/A;  . Esophagogastroduodenoscopy N/A 01/20/2014    Procedure: ESOPHAGOGASTRODUODENOSCOPY (EGD);  Surgeon: Hilarie Fredrickson,  MD;  Location: [facility];  Service: Endoscopy;  Laterality: N/A;  . Esophagogastroduodenoscopy N/A 03/19/2014    Procedure: ESOPHAGOGASTRODUODENOSCOPY (EGD);  Surgeon: Rachael Fee, MD;  Location: [facility];  Service: Endoscopy;  Laterality: N/A;  . Cholecystectomy open    . Ventral hernia repair  2015    incarcerated ventral hernia ([facility] 09/2013)/notes 01/10/2015  . Cataract extraction w/ intraocular lens  implant, bilateral    . Gastrojejunostomy      hx/notes 01/10/2015  . Laparotomy N/A 01/20/2015    Procedure: EXPLORATORY LAPAROTOMY;  Surgeon: Abigail Miyamoto, MD;  Location: [facility] OR;  Service: General;  Laterality: N/A;  . Lysis of adhesion N/A 01/20/2015    Procedure: LYSIS OF ADHESIONS < 1 HOUR;  Surgeon: Abigail Miyamoto, MD;  Location: [facility] OR;  Service: General;  Laterality: N/A;  . Esophagogastroduodenoscopy N/A 07/08/2015    Procedure: ESOPHAGOGASTRODUODENOSCOPY (EGD);  Surgeon: Sherrilyn Rist, MD;  Location: [facility] END[facility]COPY;  Service: Endoscopy;  Laterality: N/A;    Social History   Social History  . Marital Status: Widowed    Spouse Name: N/A  . Number of Children: 3  . Years of Education: N/A   Occupational History  . Not on file.   Social History Main Topics  . Smoking status: Never Smoker   . Smokeless tobacco: Former Neurosurgeon    Types: Snuff  . Alcohol Use: No  .  Drug Use: No  . Sexual Activity: No   Other Topics Concern  . Not on file   Social History Narrative   Married in [place], lives in one story house with 2 people and no pets   Occupation: ?   Has a living Will, [place], and doesn't have DNR   Was living with Husband (in frail health) in [place] until 09/2013.  After her [facility] admission they both came to live with daughter in [place]    Family History  Problem Relation Age of Onset  . Stroke Mother   . Heart attack Neg Hx   . Hypertension Mother   . Diabetes Brother     Allergies as of 08/26/2015  . (No Known Allergies)    No current  facility-administered medications on file prior to encounter.   Current Outpatient Prescriptions on File Prior to Encounter  Medication Sig Dispense Refill  . acetaminophen (TYLENOL) 325 MG tablet Take 325 mg by mouth every 6 (six) hours as needed for mild pain or fever.     [name] ADVAIR DISKUS 250-50 MCG/DOSE AEPB INHALE 1 PUFF INTO THE LUNGS 2 (TWO) TIMES DAILY AS NEEDED FOR SHORTNESS OF BREATH  3  . albuterol (PROVENTIL HFA;VENTOLIN HFA) 108 (90 Base) MCG/ACT inhaler Inhale 2 puffs into the lungs every 6 (six) hours as needed for wheezing or shortness of breath. 1 Inhaler 2  . amiodarone (PACERONE) 100 MG tablet Take 1 tablet (100 mg total) by mouth daily. 30 tablet 1  . amLODipine (NORVASC) 10 MG tablet TAKE 1 TABLET BY MOUTH DAILY FOR HYPERTENSION 30 tablet 3  . apixaban (ELIQUIS) 2.5 MG TABS tablet Take 1 tablet (2.5 mg total) by mouth 2 (two) times daily. 180 tablet 1  . b complex vitamins tablet Take 1 tablet by mouth daily.     . calcium-vitamin D (OSCAL WITH D) 500-200 MG-UNIT per tablet Take 1 tablet by mouth daily with breakfast.     . chlorhexidine (PERIDEX) 0.12 % solution Use as directed 15 mLs in the mouth or throat 2 (two) times daily.    . [employer] GAS RELIEF 80 MG chewable tablet CHEW 1 TABLET BY MOUTH 4 TIMES A DAY AS NEEDED FOR FLATULENCE 30 tablet 0  . diclofenac sodium (VOLTAREN) 1 % GEL Apply 2 g topically daily as needed (for pain).     [name] doxycycline (VIBRA-TABS) 100 MG tablet Take 1 tablet (100 mg total) by mouth 2 (two) times daily. 20 tablet 0  . feeding supplement (BOOST / RESOURCE BREEZE) LIQD Take 1 Container by mouth 3 (three) times daily between meals. 90 Container 0  . fluticasone (FLONASE) 50 MCG/ACT nasal spray Place 2 sprays into both nostrils daily. 16 g 3  . furosemide (LASIX) 20 MG tablet Take 1 tablet (20 mg total) by mouth daily. 30 tablet 5  . gabapentin (NEURONTIN) 300 MG capsule Take 2 caps po in AM and qhs, take 1 cap daily at NOON 150 capsule 6  . hydrALAZINE  (APRESOLINE) 50 MG tablet Take 1 tablet (50 mg total) by mouth 3 (three) times daily. 270 tablet 3  . isosorbide mononitrate (IMDUR) 30 MG 24 hr tablet TAKE 1 TABLET (30 MG TOTAL) BY MOUTH DAILY.  2  . lactulose (CHRONULAC) 10 GM/15ML solution Take 15 mLs (10 g total) by mouth daily as needed for mild constipation. 240 mL 0  . latanoprost (XALATAN) 0.005 % ophthalmic solution Place 1 drop into both eyes at bedtime. 2.5 mL 12  . levothyroxine (SYNTHROID,  LEVOTHROID) 50 MCG tablet Take 1 tablet (50 mcg total) by mouth daily before breakfast. 30 tablet 1  . Multiple Vitamins-Minerals (MULTIVITAMIN WITH MINERALS) tablet Take 1 tablet by mouth every morning.     . pantoprazole (PROTONIX) 40 MG tablet TAKE 1 TABLET BY MOUTH TWICE A DAY 60 tablet 5  . Polyethyl Glycol-Propyl Glycol 0.4-0.3 % SOLN Place 1 drop into both eyes 4 (four) times daily.     . polyethylene glycol (MIRALAX / GLYCOLAX) packet Take 17 g by mouth 2 (two) times daily.     . RESTASIS 0.05 % ophthalmic emulsion USE 1 DROP INTO BOTH EYES TWICE DAILY 60 mL 1  . sennosides-docusate sodium (SENOKOT-S) 8.6-50 MG tablet Take 1 tablet by mouth 2 (two) times daily.     . sodium chloride 1 g tablet TAKE 1 TABLET BY MOUTH THREE TIMES DAILY 90 tablet 2  . traMADol (ULTRAM) 50 MG tablet Take 50 mg by mouth 2 (two) times daily as needed for moderate pain.        REVIEW OF SYSTEMS:  Reviewed in her chart with nothing to add PHYSICAL EXAMINATION:  General: The patient is a well-nourished female, in no acute distress. Vital signs are BP 164/65 mmHg  Pulse 75  Temp(Src) 98.3 F (36.8 C) (Oral)  Resp 19  Ht 5\' 5"  (1.651 m)  Wt 150 lb 11.2 oz (68.357 kg)  BMI 25.08 kg/m2  SpO2 95% Pulmonary: There is a good air exchange bilaterally without wheezing or rales. Abdomen: Soft and non-tender with hyperactive bowel sounds. No tenderness Musculoskeletal: There are no major deformities.  There is no significant extremity pain. Neurologic: No focal  weakness or paresthesias are detected, Skin: Some swelling and black in discoloration of right third toe Psychiatric: The patient has normal affect. Cardiovascular: There is a regular rate and rhythm without significant murmur appreciated. Pulse status: 2+ radial and 2+ femoral pulses bilaterally. Absent popliteal and pedal pulses bilaterally  Hand-held Doppler reveals biphasic signals in the posterior tibial and peroneal bilaterally. Dampened dorsalis pedis signal bilaterally  The patient had noninvasive arterial studies at [facility] on 9 2016 This revealed ankle arm index in the 0.9 range bilaterally and toe brachial index in the 0.6 range bilaterally  Creatinine greater than 2 chronically  Impression and Plan:  Had long discussion with the patient and with her daughter by telephone. She does have a lower from any arterial insufficiency but this appears to be mild. Suspect this is related to superficial femoral artery occlusive disease. Feel that she should have adequate flow to heal an amputation although this may be borderline. Would recommend continuing with plan for amputation. If there is concern regarding healing she could undergo arteriography. Concern with her renal insufficiency at this point could put her renal function at further risk. The patient is quite adamant that she does not 1 amputation currently. I explained to the patient and her daughter that the indication would be pain which she is not having been progressive tissue loss or infection when she is not having as well. We will follow from the sideline. Would not recommend any arteriogram for further evaluation currently    Jalyiah Shelley [facility] Office: [phone]

## 2015-08-28 NOTE — Progress Notes (Signed)
Pharmacy Antibiotic Note  Shannon [name] is a 82 y.o. female admitted on 08/26/2015 with nausea/vomiting/fatigue. Incidentally found R third toe ulcer. She recently took a course of doxycycline which stopped the toe from draining.  Ortho saw the patient and recommended an amputation, however the patient refused. Xray of foot completed yesterday cannot rule out osteomyelitis. SCr 1.5 > 2, eCrCl 15-20 ml/min.  Plan: -vancomycin 1250 mg IV x1 then 1g/48h -continue flagyl 500 mg IV tid -Monitor renal fx, uop, VR as indicated -Monitor cultures, MRI of toe, surgical plans  Height: 5\' 5"  (165.1 cm) Weight: 150 lb 11.2 oz (68.357 kg) IBW/kg (Calculated) : 57  Temp (24hrs), Avg:98.4 F (36.9 C), Min:98.2 F (36.8 C), Max:98.8 F (37.1 C)   Recent Labs Lab 08/26/15 0630 08/27/15 0153 08/28/15 0632  WBC 13.7* 15.7* 17.9*  CREATININE 1.99* 2.27* 2.00*    Estimated Creatinine Clearance: 18.2 mL/min (by C-G formula based on Cr of 2).    No Known Allergies  Antimicrobials this admission: 5/25 cipro >> 5/27 5/25 flagyl >> 5/27 vancomycin >>  Dose adjustments this admission: NA  Microbiology results: 5/25 urine cx: negative 5/27 cdiff: negative  Thank you for allowing pharmacy to be a part of this patient's care.  Harvel Quale 08/28/2015 11:26 AM

## 2015-08-28 NOTE — Progress Notes (Signed)
    Subjective: 1 Day Post-Op Procedure(s) (LRB): AMPUTATION DIGIT (Right) Patient reports pain as 2 on 0-10 scale.   Denies CP or SOB.  Voiding without difficulty. Positive flatus. Objective: Vital signs in last 24 hours: Temp:  [98.2 F (36.8 C)-98.8 F (37.1 C)] 98.8 F (37.1 C) (05/27 0620) Pulse Rate:  [75-82] 77 (05/27 0620) Resp:  [18] 18 (05/27 0620) BP: (148-162)/(59-69) 162/69 mmHg (05/27 0620) SpO2:  [93 %-96 %] 93 % (05/27 0834)  Intake/Output from previous day: 05/26 0701 - 05/27 0700 In: 0  Out: 900 [Urine:900] Intake/Output this shift: Total I/O In: 1400 [I.V.:1200; IV Piggyback:200] Out: 200 [Urine:200]  Labs:  Recent Labs  08/26/15 0630 08/27/15 0153 08/28/15 0632  HGB 10.8* 11.3* 10.3*    Recent Labs  08/27/15 0153 08/28/15 0632  WBC 15.7* 17.9*  RBC 3.66* 3.32*  HCT 34.4* 31.3*  PLT 366 307    Recent Labs  08/27/15 0153 08/28/15 0632  NA 141 140  K 3.6 3.6  CL 107 110  CO2 20* 20*  BUN 20 17  CREATININE 2.27* 2.00*  GLUCOSE 56* 71  CALCIUM 9.5 8.6*   No results for input(s): LABPT, INR in the last 72 hours.  Physical Exam: Neurologically intact ABD soft Dorsiflexion/Plantar flexion intact Compartment soft   Assessment/Plan: 1 Day Post-Op Procedure(s) (LRB): AMPUTATION DIGIT (Right) Dr Doran Durand ordered vascular consult and MRI Consideration for metatarsal amputation  [place]any Winslow, Darla Lesches for Dr. Melina Schools [facility] [phone] 08/28/2015, 11:19 AM

## 2015-08-28 NOTE — Progress Notes (Signed)
[facility] Progress Note    Since last GI note: I had ordered NG tube yesterday based on distended stomach, vomiting.  It was not placed however she's had no vomiting overnight and is feeling overall better so that was probably a good thing.  Objective: Vital signs in last 24 hours: Temp:  [98.2 F (36.8 C)-98.8 F (37.1 C)] 98.8 F (37.1 C) (05/27 0620) Pulse Rate:  [75-82] 77 (05/27 0620) Resp:  [18] 18 (05/27 0620) BP: (148-162)/(59-69) 162/69 mmHg (05/27 0620) SpO2:  [93 %-96 %] 93 % (05/27 0834) Last BM Date: 08/26/15 General: alert and oriented times 3 Heart: regular rate and rythm Abdomen: soft, non-tender, non-distended, normal bowel sounds   Lab Results:  Recent Labs  08/26/15 0630 08/27/15 0153 08/28/15 0632  WBC 13.7* 15.7* 17.9*  HGB 10.8* 11.3* 10.3*  PLT 335 366 307  MCV 93.3 94.0 94.3    Recent Labs  08/26/15 0630 08/27/15 0153 08/28/15 0632  NA 139 141 140  K 3.9 3.6 3.6  CL 105 107 110  CO2 24 20* 20*  GLUCOSE 67 56* 71  BUN 17 20 17   CREATININE 1.99* 2.27* 2.00*  CALCIUM 9.9 9.5 8.6*    Recent Labs  08/26/15 0630  PROT 6.6  ALBUMIN 3.4*  AST 37  ALT 27  ALKPHOS 118  BILITOT 0.5   Studies/Results: Dg Foot Complete Right  08/27/2015  CLINICAL DATA:  Diabetic ulcer right foot, muscle necrosis, please evaluate bony changes at distal phalanx right third toe EXAM: RIGHT FOOT COMPLETE - 3+ VIEW COMPARISON:  08/17/2015 and 08/26/2014 FINDINGS: Three views of the right foot submitted. No acute fracture or subluxation. Again noted hallux valgus deformity. There is diffuse osteopenia. Small plantar spur of calcaneus. Atherosclerotic vascular calcifications are noted. There is dorsal spurring tarsal metatarsal joint. Significant soft tissue swelling noted right third toe. There is osteo lysis and some bony fragmentation at the tip of distal phalanx third third toe. Osteomyelitis cannot be excluded. Further correlation with MRI is  recommended. IMPRESSION: No acute fracture or subluxation. Again noted hallux valgus deformity. There is diffuse osteopenia. Small plantar spur of calcaneus. Atherosclerotic vascular calcifications are noted. There is dorsal spurring tarsal metatarsal joint. Significant soft tissue swelling noted right third toe. There is osteo lysis and some bony fragmentation at the tip of distal phalanx third third toe. Osteomyelitis cannot be excluded. Further correlation with MRI is recommended. Electronically Signed   By: Lahoma Crocker M.D.   On: 08/27/2015 16:34     Medications: Scheduled Meds: . fluticasone  2 spray Each Nare Daily  . ipratropium-albuterol  3 mL Nebulization TID  . levothyroxine  44 mcg Intravenous Q breakfast  . metronidazole  500 mg Intravenous Q8H  . ondansetron  4 mg Intravenous BID  . pantoprazole (PROTONIX) IV  40 mg Intravenous Q24H   Continuous Infusions: . sodium chloride 100 mL/hr at 08/27/15 2357  . heparin 800 Units/hr (08/27/15 2356)   PRN Meds:.acetaminophen **OR** acetaminophen, albuterol, bisacodyl, hydrALAZINE, ondansetron **OR** ondansetron (ZOFRAN) IV    Assessment/Plan: 82 y.o. female with acute on chronic nausea, also loose stools with recent abx exposure.  C. Diff was negative; should complete 5 more days of flagyl and then OK to stop Will advance to clear liquids and see how she does.  Continue IV PPI for now, scheduled zofran.   Toe osteo may be contributing to her GI issues (acute on chronic gastric dysfunction)  Milus Banister, MD  08/28/2015, 9:36 AM [facility] Pager (  [phone]     

## 2015-08-28 NOTE — Progress Notes (Addendum)
Hypoglycemic Event  CBG: 64 Treatment: 15 GM carbohydrate snack - pt refused  Symptom: None Follow-up CBG: Time: 2145 CBG Result: 35  MD notified: [name], NP @ 2245  2nd Treatment: 102ml D50 Follow-up CBG: Time: 2300 CBG Result: 131  Shannon [name]

## 2015-08-28 NOTE — Progress Notes (Signed)
ANTICOAGULATION CONSULT NOTE  Pharmacy Consult for heparin Indication: atrial fibrillation  No Known Allergies  Patient Measurements: Height: 5\' 5"  (165.1 cm) Weight: 150 lb 11.2 oz (68.357 kg) IBW/kg (Calculated) : 57 Heparin Dosing Weight: 68.4  Vital Signs: Temp: 98.8 F (37.1 C) (05/27 0620) Temp Source: Oral (05/27 0620) BP: 162/69 mmHg (05/27 0620) Pulse Rate: 77 (05/27 0620)  Labs:  Recent Labs  08/26/15 0630  08/26/15 1615 08/27/15 0153 08/27/15 0931 08/27/15 2249 08/28/15 0632 08/28/15 0724  HGB 10.8*  --   --  11.3*  --   --  10.3*  --   HCT 32.2*  --   --  34.4*  --   --  31.3*  --   PLT 335  --   --  366  --   --  307  --   APTT  --   < > 36 92* 126* 102*  --  95*  HEPARINUNFRC  --   --  0.80* 1.10*  --   --   --  1.04*  CREATININE 1.99*  --   --  2.27*  --   --  2.00*  --   < > = values in this interval not displayed.  Estimated Creatinine Clearance: 18.2 mL/min (by C-G formula based on Cr of 2).   Medical History: Past Medical History  Diagnosis Date  . Pneumonia   . Hypertension   . Small bowel obstruction ([place])     "I don't know how many" (01/11/2015)  . Ventral hernia with bowel obstruction   . Asthma   . Hyperlipidemia   . Stroke ([place])     "light one"  . COPD (chronic obstructive pulmonary disease) ([place])   . Hypothyroidism   . Type II diabetes mellitus ([place])   . Anemia     previous blood transfusions  . GERD (gastroesophageal reflux disease)   . History of stomach ulcers   . Arthritis     "all over"  . Renal insufficiency   . Depression     "light case"  . SIADH (syndrome of inappropriate ADH production) ([place])     [name] 01/10/2015  . Perforated gastric ulcer ([place])   . Gastric stenosis   . Paraesophageal hernia   . Bradycardia     topped BB and amiodarone decreased- junctional rhythm     Medications:  Scheduled:  . fluticasone  2 spray Each Nare Daily  . ipratropium-albuterol  3 mL Nebulization TID  . levothyroxine  44  mcg Intravenous Q breakfast  . metronidazole  500 mg Intravenous Q8H  . ondansetron  4 mg Intravenous BID  . pantoprazole (PROTONIX) IV  40 mg Intravenous Q24H    Assessment: 82 yo woman admitted 08/26/2015 with abdominal pain on apixaban PTA for hx AFlutter. CHADSVASc at least 5. Pharmacy consulted to dose heparin.  Last dose apixaban 5/24 at unknown time. Will monitor both aPTT and anti-Xa until both correlate.   aPTT 95 sec on heparin 800 units/hr. HL 1.04 (will continue to monitor both aPTT/HL for another day). No documented bleeding.   Goal of Therapy:  Heparin level 0.3-0.7 units/ml aPTT 66-102 seconds Monitor platelets by anticoagulation protocol: Yes   Plan:  Continue heparin at 800 units/hr Daily aPTT/HL Monitor s/sx of bleeding   Darl Pikes, PharmD Clinical Pharmacist- Resident Pager: [phone]  08/28/2015 9:01 AM

## 2015-08-28 NOTE — Progress Notes (Signed)
[facility]                                                                              Patient Demographics  Shannon [name], is a 82 y.o. female, DOB - Jan 06, 1924, KZS:[id number]  Admit date - 08/26/2015   Admitting Physician Joseph Art, DO  Outpatient Primary MD for the patient is Kirt Boys, DO  Outpatient specialists:   LOS - 2  days    Chief Complaint  Patient presents with  . Abdominal Pain       Brief summary   Shannon [name] is a 82 y.o. female with and extensive past medical and surgical history . She's had multiple intestinal surgeries as listed below. Patient known to [facility]. Patient developed nausea several days ago then yesterday began vomiting. She has minor diarrhea and lower abdominal pain a couple of days ago. She just completed antibiotics for toe ulcer. No ill contacts. CT DDU:KGURKY moderately distended stomach with thickened antral region, signs of gastritis and his hepatitis, colitis with nonspecific pattern, cystitis     Assessment & Plan     Intractable Nausea and vomiting: Patient has long-standing GI and surgical history including a sigmoidectomy, remote gastro-jejunostomy and suspected vagotomy, resection of gastrojejunal anastomosis 2015 at [facility]. She has a history of gastroparesis with gastric bezoar formation, history of severe candida esophagitis, SBO requiring exp lap and LOA Oct 2016. Known to Dr. Marina Goodell with [facility].  - Last EGD for dysphagia in 4/16 showed diffuse candidiasis in the entire esophagus, gastric stenosis at the pylorus, normal duodenum  - No SBO on CT abdomen - C. difficile negative, GI recommending to complete 5 more days of Flagyl and then stop - Started on clear liquid diet and advance as tolerated - Continue IV PPI and scheduled Zofran  Diabetic ulcer, right third toe with gangrene  - Patient follows wound care Center, had completed a course of doxycycline prior to admission - I had consulted  orthopedics, Dr. Victorino Dike graciously saw the patient and had even scheduled OR for surgery; however patient refused the toe amputation. X-rays of the foot suggestive of osteomyelitis of the right third toe. - ABIs pending, MRI of the foot ordered - Discussed with vascular surgery, Dr. Arbie Cookey, will see patient - Given osteomyelitis, I will place the patient on vancomycin until definitive management is planned   atrial fibrillation  - Rate controlled. On amiodarone and Eliquis  - Currently rate controlled, - Hold Eliquis, on IV heparin drip.   Asthma - continue duo nebs  Diabetes, type II, diet controlled - Follow hemoglobin A1c  Chronic diastolic CHF, on imdur, lasix 20mg  daily.  - Continue to hold Lasix, Cr trending up, due to intractable vomiting   Hypertension -IV hydralazine as needed  Acute on CKD, stage 3. Cr 1.99, baseline 1.3  - Hold Lasix, continue aggressive IV fluid hydration  Hypothyroidism. TSH 08/02/15 was 83.  -Change home oral Synthroid to IV, TSH 84.8 -Increase Synthroid to 88 MCG, for now on IV 44 MCG daily - Follow up TSH in 4 weeks outpatient  Constipation, on miralax and senokot, lactulose. -Dulcolax suppositories as needed, restart stool  softeners  Depression. Stable.  - restart Zoloft  GERD. On PPI at home - IV PPI  Code Status: Partial code  DVT Prophylaxis: On heparin drip   Family Communication: Discussed in detail with the patient, all imaging results, lab results explained to the patient   Disposition Plan:   Time Spent in minutes  25 minutes  Procedures:  CT abdomen  Consultants:   GI Orthopedics Vascular surgery  Antimicrobials:   IV ciprofloxacin 5/25 -526  IV Flagyl 5/25   Medications  Scheduled Meds: . fluticasone  2 spray Each Nare Daily  . ipratropium-albuterol  3 mL Nebulization TID  . levothyroxine  44 mcg Intravenous Q breakfast  . metronidazole  500 mg Intravenous Q8H  . ondansetron  4 mg Intravenous BID   . pantoprazole (PROTONIX) IV  40 mg Intravenous Q24H   Continuous Infusions: . sodium chloride 100 mL/hr at 08/28/15 1030  . heparin 800 Units/hr (08/27/15 2356)   PRN Meds:.acetaminophen **OR** acetaminophen, albuterol, bisacodyl, hydrALAZINE, ondansetron **OR** ondansetron (ZOFRAN) IV   Antibiotics   Anti-infectives    Start     Dose/Rate Route Frequency Ordered Stop   08/27/15 1100  ciprofloxacin (CIPRO) IVPB 400 mg  Status:  Discontinued     400 mg 200 mL/hr over 60 Minutes Intravenous Every 24 hours 08/26/15 1648 08/27/15 1414   08/26/15 2300  ciprofloxacin (CIPRO) IVPB 400 mg  Status:  Discontinued     400 mg 200 mL/hr over 60 Minutes Intravenous Every 12 hours 08/26/15 1646 08/26/15 1648   08/26/15 2000  metroNIDAZOLE (FLAGYL) IVPB 500 mg     500 mg 100 mL/hr over 60 Minutes Intravenous Every 8 hours 08/26/15 1646     08/26/15 1600  metroNIDAZOLE (FLAGYL) IVPB 500 mg  Status:  Discontinued     500 mg 100 mL/hr over 60 Minutes Intravenous Every 8 hours 08/26/15 1559 08/26/15 1645   08/26/15 1600  ciprofloxacin (CIPRO) IVPB 400 mg  Status:  Discontinued     400 mg 200 mL/hr over 60 Minutes Intravenous Every 12 hours 08/26/15 1559 08/26/15 1646   08/26/15 1130  ciprofloxacin (CIPRO) IVPB 400 mg     400 mg 200 mL/hr over 60 Minutes Intravenous  Once 08/26/15 1119 08/26/15 1311   08/26/15 1130  metroNIDAZOLE (FLAGYL) IVPB 500 mg     500 mg 100 mL/hr over 60 Minutes Intravenous  Once 08/26/15 1119 08/26/15 1311        Subjective:   Shannon [name] was seen and examined today.Denies any nausea, vomiting, abdominal pain today. Overall feeling better. Hungry. Still refuses to have any amputation on her toe . No acute events overnight. No fevers or chills.  Objective:   Filed Vitals:   08/27/15 2050 08/27/15 2111 08/28/15 0620 08/28/15 0834  BP: 148/59  162/69   Pulse: 75  77   Temp: 98.2 F (36.8 C)  98.8 F (37.1 C)   TempSrc: Oral  Oral   Resp: 18  18   Height:       Weight:      SpO2: 94% 95% 96% 93%    Intake/Output Summary (Last 24 hours) at 08/28/15 1058 Last data filed at 08/28/15 0800  Gross per 24 hour  Intake   1400 ml  Output    900 ml  Net    500 ml     Wt Readings from Last 3 Encounters:  08/26/15 68.357 kg (150 lb 11.2 oz)  08/10/15 67.495 kg (148 lb 12.8 oz)  08/06/15 66.951 kg (  147 lb 9.6 oz)     Exam  General: Alert and oriented x 3, NAD  HEENT:    Neck:  Cardiovascular: S1 S2 clear  Respiratory: Clear to auscultation bilaterally, no wheezing, rales or rhonchi  Gastrointestinal: Soft, nontender, distended, + bowel sounds  Ext: no cyanosis clubbing or edema, Right third toe with gangrene  Neuro: no new deficits  Skin: Right third toe with dusky coloration   Psych: Normal affect and demeanor, alert and oriented x3    Data Reviewed:  I have personally reviewed following labs and imaging studies  Micro Results Recent Results (from the past 240 hour(s))  Urine culture     Status: None   Collection Time: 08/26/15  7:37 AM  Result Value Ref Range Status   Specimen Description URINE, CATHETERIZED  Final   Special Requests NONE  Final   Culture NO GROWTH  Final   Report Status 08/27/2015 FINAL  Final  C difficile quick scan w PCR reflex     Status: None   Collection Time: 08/28/15  6:14 AM  Result Value Ref Range Status   C Diff antigen NEGATIVE NEGATIVE Final   C Diff toxin NEGATIVE NEGATIVE Final   C Diff interpretation Negative for toxigenic C. difficile  Final    Radiology Reports Ct Abdomen Pelvis Wo Contrast  08/26/2015  CLINICAL DATA:  Left upper quadrant pain with nausea and vomiting for 1 week. EXAM: CT ABDOMEN AND PELVIS WITHOUT CONTRAST TECHNIQUE: Multidetector CT imaging of the abdomen and pelvis was performed following the standard protocol without IV contrast. COMPARISON:  07/05/2015 FINDINGS: Lower chest and abdominal wall: Extensive ventral abdominal wall scarring with a fatty hernia left of  midline, likely incisional. There is fluid within the thick walled lower esophagus which contains refluxed or non cleared fluid. Eventration of the right diaphragm. Hepatobiliary: Multiple hepatic low densities fairly circumscribed and likely cysts.Cholecystectomy. Pancreas: Unremarkable. Spleen: Unremarkable. Adrenals/Urinary Tract: Negative adrenals. No hydronephrosis or stone. Bladder wall thickening. Very Peristranding could be related to generalized edema or cystitis. Reproductive:No pathologic findings. Stomach/Bowel: The stomach is circumferentially thick walled and moderately distended. The antral region is particularly thickened and collapsed. Suture along the lower gastric body. No small bowel dilatation. There is a proximal enteroenteric anastomosis. The proximal colon shows wall thickening and pericolonic edema, greatest along the proximal transverse segment. Colonic inflammatory changes are fairly extensive, more compatible with colitis and diverticulitis. No pneumatosis or pneumoperitoneum. Scattered colonic and ileal diverticulosis. Possible previous sigmoidectomy. Vascular/Lymphatic: No acute vascular Find.  No mass or adenopathy. Peritoneal: No ascites or pneumoperitoneum. Musculoskeletal: No acute abnormalities. Remote appearing T11 superior endplate compression fracture. Lumbar facet arthropathy with mild L5-S1 anterolisthesis. IMPRESSION: 1. Moderately distended stomach with thickened antral region. Patient has history of chronic impaired gastric emptying and recent EGD. 2. Signs of gastritis and esophagitis. 3. Colitis with nonspecific pattern. 4. Cystitis, correlate with urinalysis. Electronically Signed   By: Marnee Spring M.D.   On: 08/26/2015 09:24   Dg Chest 2 View  08/26/2015  CLINICAL DATA:  Abdominal pain, nausea and vomiting. Gastroesophageal reflux. EXAM: CHEST  2 VIEW COMPARISON:  08/02/2015 and chest CT dated 07/07/2015. FINDINGS: Borderline enlarged cardiac silhouette with an  interval decrease in size. Linear density at both lung bases. Mildly prominent pulmonary vasculature. Mild diffuse peribronchial thickening. Stable right diaphragmatic eventration. Atheromatous arterial calcifications. Upper abdominal surgical clips. Diffuse osteopenia. Bilateral shoulder degenerative changes. IMPRESSION: 1. Mildly improved left basilar linear atelectasis and possible scarring. 2. Interval mild right basilar linear  atelectasis. 3. Improved cardiomegaly. 4. Mild pulmonary vascular congestion. 5. Mild bronchitic changes. Electronically Signed   By: Beckie Salts M.D.   On: 08/26/2015 07:22   Dg Chest Port 1 View  08/02/2015  CLINICAL DATA:  Severe bradycardia.  Generalized weakness. EXAM: PORTABLE CHEST 1 VIEW COMPARISON:  07/08/2015 FINDINGS: Cardiomegaly with calcification in the thoracic aorta. Pulmonary vascularity is normal. Slight atelectasis at the left lung base. Right lung is clear. No acute bone abnormality. IMPRESSION: New cardiomegaly. Aortic atherosclerosis. Slight atelectasis at the left lung base. Electronically Signed   By: Francene Boyers M.D.   On: 08/02/2015 16:37   Dg Foot 2 Views Right  08/19/2015  AP and lateral xrays right foot were taken. Midfoot arthritis noted.  Hammer toe digit.  HAV deformity 1st MPJ.  There is white bone in third toe with no evidence of cystic formation.  No fracture dislocation or tumor noted.  Bone density appears normal for patients age and sex.  Joint space appears normal.  No frank deformity.   Helane Gunther DPM  Dg Foot Complete Right  08/27/2015  CLINICAL DATA:  Diabetic ulcer right foot, muscle necrosis, please evaluate bony changes at distal phalanx right third toe EXAM: RIGHT FOOT COMPLETE - 3+ VIEW COMPARISON:  08/17/2015 and 08/26/2014 FINDINGS: Three views of the right foot submitted. No acute fracture or subluxation. Again noted hallux valgus deformity. There is diffuse osteopenia. Small plantar spur of calcaneus. Atherosclerotic  vascular calcifications are noted. There is dorsal spurring tarsal metatarsal joint. Significant soft tissue swelling noted right third toe. There is osteo lysis and some bony fragmentation at the tip of distal phalanx third third toe. Osteomyelitis cannot be excluded. Further correlation with MRI is recommended. IMPRESSION: No acute fracture or subluxation. Again noted hallux valgus deformity. There is diffuse osteopenia. Small plantar spur of calcaneus. Atherosclerotic vascular calcifications are noted. There is dorsal spurring tarsal metatarsal joint. Significant soft tissue swelling noted right third toe. There is osteo lysis and some bony fragmentation at the tip of distal phalanx third third toe. Osteomyelitis cannot be excluded. Further correlation with MRI is recommended. Electronically Signed   By: Natasha Mead M.D.   On: 08/27/2015 16:34    Lab Data:  CBC:  Recent Labs Lab 08/26/15 0630 08/27/15 0153 08/28/15 0632  WBC 13.7* 15.7* 17.9*  HGB 10.8* 11.3* 10.3*  HCT 32.2* 34.4* 31.3*  MCV 93.3 94.0 94.3  PLT 335 366 307   Basic Metabolic Panel:  Recent Labs Lab 08/26/15 0630 08/27/15 0153 08/28/15 0632  NA 139 141 140  K 3.9 3.6 3.6  CL 105 107 110  CO2 24 20* 20*  GLUCOSE 67 56* 71  BUN 17 20 17   CREATININE 1.99* 2.27* 2.00*  CALCIUM 9.9 9.5 8.6*   GFR: Estimated Creatinine Clearance: 18.2 mL/min (by C-G formula based on Cr of 2). Liver Function Tests:  Recent Labs Lab 08/26/15 0630  AST 37  ALT 27  ALKPHOS 118  BILITOT 0.5  PROT 6.6  ALBUMIN 3.4*    Recent Labs Lab 08/26/15 0630  LIPASE 18   No results for input(s): AMMONIA in the last 168 hours. Coagulation Profile: No results for input(s): INR, PROTIME in the last 168 hours. Cardiac Enzymes: No results for input(s): CKTOTAL, CKMB, CKMBINDEX, TROPONINI in the last 168 hours. BNP (last 3 results) No results for input(s): PROBNP in the last 8760 hours. HbA1C: No results for input(s): HGBA1C in the  last 72 hours. CBG: No results for input(s): GLUCAP in  the last 168 hours. Lipid Profile: No results for input(s): CHOL, HDL, LDLCALC, TRIG, CHOLHDL, LDLDIRECT in the last 72 hours. Thyroid Function Tests:  Recent Labs  08/26/15 1615  TSH 84.851*   Anemia Panel: No results for input(s): VITAMINB12, FOLATE, FERRITIN, TIBC, IRON, RETICCTPCT in the last 72 hours. Urine analysis:    Component Value Date/Time   COLORURINE YELLOW 08/26/2015 0737   APPEARANCEUR CLEAR 08/26/2015 0737   LABSPEC 1.022 08/26/2015 0737   PHURINE 6.5 08/26/2015 0737   GLUCOSEU NEGATIVE 08/26/2015 0737   HGBUR NEGATIVE 08/26/2015 0737   BILIRUBINUR NEGATIVE 08/26/2015 0737   KETONESUR 15* 08/26/2015 0737   PROTEINUR 100* 08/26/2015 0737   UROBILINOGEN 0.2 01/27/2015 2308   NITRITE NEGATIVE 08/26/2015 0737   LEUKOCYTESUR NEGATIVE 08/26/2015 0737     Geriann Lafont M.D. [facility] 08/28/2015, 10:58 AM  Pager: [phone] Between 7am to 7pm - call Pager - [phone]  After 7pm go to [email or web address] - password [email or web address]  Call night coverage person covering after 7pm

## 2015-08-28 NOTE — Progress Notes (Signed)
Hypoglycemic Event  CBG: 61  Treatment: 4 oz orange juice  Symptoms: asymptomatic  Follow-up CBG: Time:12:59 CBG Result:61  Possible Reasons for Event: pt. Was NPO  CBG: 61  Treatment: 8 oz orange juice and clear liquid tray  Symptoms: still asymptomatic  Follow-up CBG: Time 1:18 CBG: 62  Treatment: oral glucose  Comments/MD notified: Justine Null

## 2015-08-29 DIAGNOSIS — F4322 Adjustment disorder with anxiety: Secondary | ICD-10-CM | POA: Diagnosis present

## 2015-08-29 LAB — CBC
HCT: 31.2 % — ABNORMAL LOW (ref 36.0–46.0)
Hemoglobin: 10.4 g/dL — ABNORMAL LOW (ref 12.0–15.0)
MCH: 31.8 pg (ref 26.0–34.0)
MCHC: 33.3 g/dL (ref 30.0–36.0)
MCV: 95.4 fL (ref 78.0–100.0)
Platelets: 267 10*3/uL (ref 150–400)
RBC: 3.27 MIL/uL — ABNORMAL LOW (ref 3.87–5.11)
RDW: 17.9 % — ABNORMAL HIGH (ref 11.5–15.5)
WBC: 21.8 10*3/uL — ABNORMAL HIGH (ref 4.0–10.5)

## 2015-08-29 LAB — BASIC METABOLIC PANEL
Anion gap: 9 (ref 5–15)
BUN: 11 mg/dL (ref 6–20)
CO2: 23 mmol/L (ref 22–32)
Calcium: 9.1 mg/dL (ref 8.9–10.3)
Chloride: 109 mmol/L (ref 101–111)
Creatinine, Ser: 1.5 mg/dL — ABNORMAL HIGH (ref 0.44–1.00)
GFR calc Af Amer: 34 mL/min — ABNORMAL LOW (ref >60)
GFR calc non Af Amer: 29 mL/min — ABNORMAL LOW (ref >60)
Glucose, Bld: 87 mg/dL (ref 65–99)
Potassium: 3.3 mmol/L — ABNORMAL LOW (ref 3.5–5.1)
Sodium: 141 mmol/L (ref 135–145)

## 2015-08-29 LAB — APTT
aPTT: 181 seconds — ABNORMAL HIGH (ref 24–37)
aPTT: 48 seconds — ABNORMAL HIGH (ref 24–37)

## 2015-08-29 LAB — GLUCOSE, CAPILLARY
Glucose-Capillary: 93 mg/dL (ref 65–99)
Glucose-Capillary: 81 mg/dL (ref 65–99)
Glucose-Capillary: 74 mg/dL (ref 65–99)
Glucose-Capillary: 79 mg/dL (ref 65–99)

## 2015-08-29 LAB — HEPARIN LEVEL (UNFRACTIONATED)
Heparin Unfractionated: 0.91 IU/mL — ABNORMAL HIGH (ref 0.30–0.70)
Heparin Unfractionated: 0.23 IU/mL — ABNORMAL LOW (ref 0.30–0.70)

## 2015-08-29 MED ORDER — LOPERAMIDE HCL 2 MG PO CAPS
2.0000 mg | ORAL_CAPSULE | Freq: Once | ORAL | Status: AC
Start: 2015-08-30 — End: 2015-08-30
  Administered 2015-08-30: 2 mg via ORAL
  Filled 2015-08-29: qty 1

## 2015-08-29 MED ORDER — LEVOTHYROXINE SODIUM 88 MCG PO TABS
88.0000 ug | ORAL_TABLET | Freq: Every day | ORAL | Status: DC
  Administered 2015-08-30 – 2015-08-31 (×2): 88 ug via ORAL
  Filled 2015-08-29 (×2): qty 1

## 2015-08-29 MED ORDER — LEVOTHYROXINE SODIUM 50 MCG PO TABS: 50.0000 ug | ORAL_TABLET | Freq: Every day | ORAL | Status: DC

## 2015-08-29 MED ORDER — HYDRALAZINE HCL 50 MG PO TABS
50.0000 mg | ORAL_TABLET | Freq: Three times a day (TID) | ORAL | Status: DC
  Administered 2015-08-29 – 2015-09-04 (×21): 50 mg via ORAL
  Filled 2015-08-29 (×21): qty 1

## 2015-08-29 MED ORDER — PIPERACILLIN-TAZOBACTAM 3.375 G IVPB
3.3750 g | Freq: Three times a day (TID) | INTRAVENOUS | Status: DC
  Administered 2015-08-29 – 2015-08-30 (×3): 3.375 g via INTRAVENOUS
  Filled 2015-08-29 (×5): qty 50

## 2015-08-29 MED ORDER — PROCHLORPERAZINE EDISYLATE 5 MG/ML IJ SOLN
5.0000 mg | Freq: Once | INTRAMUSCULAR | Status: DC
  Filled 2015-08-29 (×2): qty 2

## 2015-08-29 MED ORDER — VANCOMYCIN HCL IN DEXTROSE 750-5 MG/150ML-% IV SOLN
750.0000 mg | INTRAVENOUS | Status: DC
  Administered 2015-08-29 – 2015-09-01 (×4): 750 mg via INTRAVENOUS
  Filled 2015-08-29 (×4): qty 150

## 2015-08-29 MED ORDER — ISOSORBIDE MONONITRATE ER 30 MG PO TB24
30.0000 mg | ORAL_TABLET | Freq: Every day | ORAL | Status: DC
  Administered 2015-08-29 – 2015-09-05 (×8): 30 mg via ORAL
  Filled 2015-08-29 (×8): qty 1

## 2015-08-29 MED ORDER — POTASSIUM CHLORIDE CRYS ER 20 MEQ PO TBCR
40.0000 meq | EXTENDED_RELEASE_TABLET | Freq: Once | ORAL | Status: AC
  Administered 2015-08-29: 40 meq via ORAL

## 2015-08-29 MED ORDER — HEPARIN (PORCINE) IN NACL 100-0.45 UNIT/ML-% IJ SOLN
800.0000 [IU]/h | INTRAMUSCULAR | Status: DC
  Administered 2015-08-29: 600 [IU]/h via INTRAVENOUS
  Administered 2015-08-30 – 2015-08-31 (×2): 750 [IU]/h via INTRAVENOUS
  Administered 2015-09-01 – 2015-09-02 (×2): 800 [IU]/h via INTRAVENOUS
  Filled 2015-08-29 (×4): qty 250

## 2015-08-29 NOTE — Progress Notes (Signed)
Pharmacy Antibiotic Note  Shannon [name] is a 82 y.o. female admitted on 08/26/2015 with nausea/vomiting/fatigue. Incidentally found R third toe ulcer. She recently took a course of doxycycline which stopped the toe from draining.  Ortho saw the patient and recommended an amputation, however the patient refused. Xray of foot completed yesterday cannot rule out osteomyelitis. SCr 1.5 > 2.0>1.5, eCrCl ~24 ml/min. Pharmacy consulted to added Zosyn to abx regimen  Plan: -Start Zosyn 3.375g q8 (4hr infusion) -Change vancomycin to 750mg  q24h, renal function improved -Continue flagyl 500 mg IV tid -Monitor renal fx, uop, VT as indicated -Monitor cultures, MRI of toe, surgical plans  Height: 5\' 5"  (165.1 cm) Weight: 150 lb 11.2 oz (68.357 kg) IBW/kg (Calculated) : 57  Temp (24hrs), Avg:98.4 F (36.9 C), Min:98.3 F (36.8 C), Max:98.5 F (36.9 C)   Recent Labs Lab 08/26/15 0630 08/27/15 0153 08/28/15 0632 08/29/15 0430  WBC 13.7* 15.7* 17.9* 21.8*  CREATININE 1.99* 2.27* 2.00* 1.50*    Estimated Creatinine Clearance: 24.2 mL/min (by C-G formula based on Cr of 1.5).    No Known Allergies  Antimicrobials this admission: 5/25 cipro >> 5/27 5/25 flagyl >> 5/27 vancomycin >> 5/28 Zosyn >>  Dose adjustments this admission: NA  Microbiology results: 5/25 urine cx: negative 5/27 cdiff: negative  Thank you for allowing pharmacy to be a part of this patient's care.  Darl Pikes, PharmD Clinical Pharmacist- Resident Pager: [phone]  Darl Pikes 08/29/2015 9:02 AM

## 2015-08-29 NOTE — Progress Notes (Signed)
[facility]                                                                              Patient Demographics  Shannon [name], is a 82 y.o. female, DOB - 03-12-1924, DGL:[id number]  Admit date - 08/26/2015   Admitting Physician Joseph Art, DO  Outpatient Primary MD for the patient is Kirt Boys, DO  Outpatient specialists:   LOS - 3  days    Chief Complaint  Patient presents with  . Abdominal Pain       Brief summary   Shannon [name] is a 82 y.o. female with and extensive past medical and surgical history . She's had multiple intestinal surgeries as listed below. Patient known to [facility]. Patient developed nausea several days ago then yesterday began vomiting. She has minor diarrhea and lower abdominal pain a couple of days ago. She just completed antibiotics for toe ulcer. No ill contacts. CT JJO:ACZYSA moderately distended stomach with thickened antral region, signs of gastritis and his hepatitis, colitis with nonspecific pattern, cystitis     Assessment & Plan     Intractable Nausea and vomiting: Patient has long-standing GI and surgical history including a sigmoidectomy, remote gastro-jejunostomy and suspected vagotomy, resection of gastrojejunal anastomosis 2015 at [facility]. She has a history of gastroparesis with gastric bezoar formation, history of severe candida esophagitis, SBO requiring exp lap and LOA Oct 2016. Known to Dr. Marina Goodell with [facility].  - Last EGD for dysphagia in 4/16 showed diffuse candidiasis in the entire esophagus, gastric stenosis at the pylorus, normal duodenum  - No SBO on CT abdomen - C. difficile negative, GI recommending to complete 5 more days of Flagyl and then stop - GI following, recommending advance diet to solids, on dysphagia 1 diet which is her regular home diet - Continue IV PPI and scheduled Zofran  Diabetic ulcer, right third toe with gangrene  - Patient follows wound care Center, had completed a course of doxycycline  prior to admission - I had consulted orthopedics, Dr. Victorino Dike graciously saw the patient and had even scheduled OR for surgery; however patient refused the toe amputation. X-rays of the foot suggestive of osteomyelitis of the right third toe. - Follow MRI of the foot -Appreciate vascular surgery, Dr. Bosie Helper evaluation, recommended amputation of the right third toe, patient refuses to have amputation. Per vascular surgery, would not recommend any arteriogram for further evaluation. - Given osteomyelitis, continue on vancomycin until definitive management is planned.  - I again reiterated to the patient regarding the ongoing foot infection. Patient however states that she will wait for wound care and her daughter's opinion and does not want to have any surgery. I have requested a psychiatry consultation for capacity evaluation. If patient has capacity to make her decisions, will honor her wishes, change to oral antibiotics for 4 weeks at the time of discharge. Follow closely with orthopedics outpatient.   atrial fibrillation  - Rate controlled. On amiodarone and Eliquis  - Currently rate controlled, - Hold Eliquis, on IV heparin drip.  Asthma - continue duo nebs  Diabetes, type II, diet controlled - Follow hemoglobin A1c  Chronic diastolic  CHF, on imdur, lasix 20mg  daily.  - Creatinine is improving  Hypertension uncontrolled - restart Imdur, hydralazine  Acute on CKD, stage 3. Cr 1.99, baseline 1.3  - Creatinine close to baseline, continue to hold Lasix  Hypothyroidism. TSH 08/02/15 was 83.  -Change home oral Synthroid to IV, TSH 84.8 -Increase Synthroid to 88 MCG, for now on IV 44 MCG daily - Follow up TSH in 4 weeks outpatient  Constipation, on miralax and senokot, lactulose. -Dulcolax suppositories as needed, restart stool softeners  Depression. Stable.  - restart Zoloft  GERD. On PPI at home - IV PPI  Code Status: Partial code  DVT Prophylaxis: On heparin  drip   Family Communication: Discussed in detail with the patient, all imaging results, lab results explained to the patient   Disposition Plan:   Time Spent in minutes  25 minutes  Procedures:  CT abdomen  Consultants:   GI Orthopedics Vascular surgery  Antimicrobials:   IV ciprofloxacin 5/25 -526  IV Flagyl 5/25   Medications  Scheduled Meds: . amiodarone  100 mg Oral Daily  . amLODipine  10 mg Oral Daily  . atorvastatin  10 mg Oral q1800  . calcium-vitamin D  1 tablet Oral Q breakfast  . cycloSPORINE  1 drop Both Eyes BID  . dextrose  1 Tube Oral Once  . fluticasone  2 spray Each Nare Daily  . gabapentin  300 mg Oral BID  . hydrALAZINE  50 mg Oral TID  . insulin aspart  0-5 Units Subcutaneous QHS  . insulin aspart  0-9 Units Subcutaneous TID WC  . ipratropium-albuterol  3 mL Nebulization TID  . isosorbide mononitrate  30 mg Oral Daily  . latanoprost  1 drop Both Eyes QHS  . [START ON 08/30/2015] levothyroxine  88 mcg Oral QAC breakfast  . metronidazole  500 mg Intravenous Q8H  . ondansetron  4 mg Intravenous BID  . pantoprazole (PROTONIX) IV  40 mg Intravenous Q24H  . piperacillin-tazobactam (ZOSYN)  IV  3.375 g Intravenous Q8H  . polyethylene glycol  17 g Oral BID  . polyvinyl alcohol  1 drop Both Eyes QID  . senna-docusate  1 tablet Oral BID  . sertraline  100 mg Oral Daily  . vancomycin  750 mg Intravenous Q24H   Continuous Infusions: . dextrose 5 % and 0.45% NaCl Stopped (08/28/15 2257)  . heparin 600 Units/hr (08/29/15 0709)   PRN Meds:.acetaminophen **OR** acetaminophen, albuterol, bisacodyl, hydrALAZINE, lactulose, ondansetron **OR** ondansetron (ZOFRAN) IV, simethicone   Antibiotics   Anti-infectives    Start     Dose/Rate Route Frequency Ordered Stop   08/30/15 1200  vancomycin (VANCOCIN) IVPB 1000 mg/200 mL premix  Status:  Discontinued     1,000 mg 200 mL/hr over 60 Minutes Intravenous Every 48 hours 08/28/15 1135 08/29/15 0911   08/29/15  1400  vancomycin (VANCOCIN) IVPB 750 mg/150 ml premix     750 mg 150 mL/hr over 60 Minutes Intravenous Every 24 hours 08/29/15 0911     08/29/15 1000  piperacillin-tazobactam (ZOSYN) IVPB 3.375 g     3.375 g 12.5 mL/hr over 240 Minutes Intravenous Every 8 hours 08/29/15 0909     08/28/15 1200  vancomycin (VANCOCIN) 1,250 mg in sodium chloride 0.9 % 250 mL IVPB     1,250 mg 166.7 mL/hr over 90 Minutes Intravenous  Once 08/28/15 1124 08/28/15 1605   08/27/15 1100  ciprofloxacin (CIPRO) IVPB 400 mg  Status:  Discontinued     400 mg 200 mL/hr over  60 Minutes Intravenous Every 24 hours 08/26/15 1648 08/27/15 1414   08/26/15 2300  ciprofloxacin (CIPRO) IVPB 400 mg  Status:  Discontinued     400 mg 200 mL/hr over 60 Minutes Intravenous Every 12 hours 08/26/15 1646 08/26/15 1648   08/26/15 2000  metroNIDAZOLE (FLAGYL) IVPB 500 mg     500 mg 100 mL/hr over 60 Minutes Intravenous Every 8 hours 08/26/15 1646     08/26/15 1600  metroNIDAZOLE (FLAGYL) IVPB 500 mg  Status:  Discontinued     500 mg 100 mL/hr over 60 Minutes Intravenous Every 8 hours 08/26/15 1559 08/26/15 1645   08/26/15 1600  ciprofloxacin (CIPRO) IVPB 400 mg  Status:  Discontinued     400 mg 200 mL/hr over 60 Minutes Intravenous Every 12 hours 08/26/15 1559 08/26/15 1646   08/26/15 1130  ciprofloxacin (CIPRO) IVPB 400 mg     400 mg 200 mL/hr over 60 Minutes Intravenous  Once 08/26/15 1119 08/26/15 1311   08/26/15 1130  metroNIDAZOLE (FLAGYL) IVPB 500 mg     500 mg 100 mL/hr over 60 Minutes Intravenous  Once 08/26/15 1119 08/26/15 1311        Subjective:   Shannon [name] was seen and examined today. Feeling full after eating with mild abdominal discomfort. Still refuses to have any amputation on her toe . No acute events overnight. No fevers or chills. Denies any chest pain, dyspnea, dizziness or new weakness.  Objective:   Filed Vitals:   08/28/15 2115 08/29/15 0529 08/29/15 0857 08/29/15 0945  BP: 154/68 152/58 191/75    Pulse: 85 81    Temp: 98.3 F (36.8 C) 98.5 F (36.9 C)    TempSrc: Oral Oral    Resp: 16 16    Height:      Weight:      SpO2: 94% 94%  96%    Intake/Output Summary (Last 24 hours) at 08/29/15 1104 Last data filed at 08/29/15 0400  Gross per 24 hour  Intake 881.03 ml  Output      0 ml  Net 881.03 ml     Wt Readings from Last 3 Encounters:  08/26/15 68.357 kg (150 lb 11.2 oz)  08/10/15 67.495 kg (148 lb 12.8 oz)  08/06/15 66.951 kg (147 lb 9.6 oz)     Exam  General: Alert and oriented x 3, NAD  HEENT:    Neck:  Cardiovascular: S1 S2 clear  Respiratory: CTAB  Gastrointestinal: Soft, nontender, distended, + bowel sounds  Ext: no cyanosis clubbing or edema, Right third toe with gangrene  Neuro: no new deficits  Skin: Right third toe with dusky coloration   Psych: Normal affect and demeanor, alert and oriented x3    Data Reviewed:  I have personally reviewed following labs and imaging studies  Micro Results Recent Results (from the past 240 hour(s))  Urine culture     Status: None   Collection Time: 08/26/15  7:37 AM  Result Value Ref Range Status   Specimen Description URINE, CATHETERIZED  Final   Special Requests NONE  Final   Culture NO GROWTH  Final   Report Status 08/27/2015 FINAL  Final  C difficile quick scan w PCR reflex     Status: None   Collection Time: 08/28/15  6:14 AM  Result Value Ref Range Status   C Diff antigen NEGATIVE NEGATIVE Final   C Diff toxin NEGATIVE NEGATIVE Final   C Diff interpretation Negative for toxigenic C. difficile  Final    Radiology Reports  Ct Abdomen Pelvis Wo Contrast  08/26/2015  CLINICAL DATA:  Left upper quadrant pain with nausea and vomiting for 1 week. EXAM: CT ABDOMEN AND PELVIS WITHOUT CONTRAST TECHNIQUE: Multidetector CT imaging of the abdomen and pelvis was performed following the standard protocol without IV contrast. COMPARISON:  07/05/2015 FINDINGS: Lower chest and abdominal wall: Extensive ventral  abdominal wall scarring with a fatty hernia left of midline, likely incisional. There is fluid within the thick walled lower esophagus which contains refluxed or non cleared fluid. Eventration of the right diaphragm. Hepatobiliary: Multiple hepatic low densities fairly circumscribed and likely cysts.Cholecystectomy. Pancreas: Unremarkable. Spleen: Unremarkable. Adrenals/Urinary Tract: Negative adrenals. No hydronephrosis or stone. Bladder wall thickening. Very Peristranding could be related to generalized edema or cystitis. Reproductive:No pathologic findings. Stomach/Bowel: The stomach is circumferentially thick walled and moderately distended. The antral region is particularly thickened and collapsed. Suture along the lower gastric body. No small bowel dilatation. There is a proximal enteroenteric anastomosis. The proximal colon shows wall thickening and pericolonic edema, greatest along the proximal transverse segment. Colonic inflammatory changes are fairly extensive, more compatible with colitis and diverticulitis. No pneumatosis or pneumoperitoneum. Scattered colonic and ileal diverticulosis. Possible previous sigmoidectomy. Vascular/Lymphatic: No acute vascular Find.  No mass or adenopathy. Peritoneal: No ascites or pneumoperitoneum. Musculoskeletal: No acute abnormalities. Remote appearing T11 superior endplate compression fracture. Lumbar facet arthropathy with mild L5-S1 anterolisthesis. IMPRESSION: 1. Moderately distended stomach with thickened antral region. Patient has history of chronic impaired gastric emptying and recent EGD. 2. Signs of gastritis and esophagitis. 3. Colitis with nonspecific pattern. 4. Cystitis, correlate with urinalysis. Electronically Signed   By: Marnee Spring M.D.   On: 08/26/2015 09:24   Dg Chest 2 View  08/26/2015  CLINICAL DATA:  Abdominal pain, nausea and vomiting. Gastroesophageal reflux. EXAM: CHEST  2 VIEW COMPARISON:  08/02/2015 and chest CT dated 07/07/2015.  FINDINGS: Borderline enlarged cardiac silhouette with an interval decrease in size. Linear density at both lung bases. Mildly prominent pulmonary vasculature. Mild diffuse peribronchial thickening. Stable right diaphragmatic eventration. Atheromatous arterial calcifications. Upper abdominal surgical clips. Diffuse osteopenia. Bilateral shoulder degenerative changes. IMPRESSION: 1. Mildly improved left basilar linear atelectasis and possible scarring. 2. Interval mild right basilar linear atelectasis. 3. Improved cardiomegaly. 4. Mild pulmonary vascular congestion. 5. Mild bronchitic changes. Electronically Signed   By: Beckie Salts M.D.   On: 08/26/2015 07:22   Dg Chest Port 1 View  08/02/2015  CLINICAL DATA:  Severe bradycardia.  Generalized weakness. EXAM: PORTABLE CHEST 1 VIEW COMPARISON:  07/08/2015 FINDINGS: Cardiomegaly with calcification in the thoracic aorta. Pulmonary vascularity is normal. Slight atelectasis at the left lung base. Right lung is clear. No acute bone abnormality. IMPRESSION: New cardiomegaly. Aortic atherosclerosis. Slight atelectasis at the left lung base. Electronically Signed   By: Francene Boyers M.D.   On: 08/02/2015 16:37   Dg Foot 2 Views Right  08/19/2015  AP and lateral xrays right foot were taken. Midfoot arthritis noted.  Hammer toe digit.  HAV deformity 1st MPJ.  There is white bone in third toe with no evidence of cystic formation.  No fracture dislocation or tumor noted.  Bone density appears normal for patients age and sex.  Joint space appears normal.  No frank deformity.   Helane Gunther DPM  Dg Foot Complete Right  08/27/2015  CLINICAL DATA:  Diabetic ulcer right foot, muscle necrosis, please evaluate bony changes at distal phalanx right third toe EXAM: RIGHT FOOT COMPLETE - 3+ VIEW COMPARISON:  08/17/2015 and 08/26/2014 FINDINGS: Three  views of the right foot submitted. No acute fracture or subluxation. Again noted hallux valgus deformity. There is diffuse  osteopenia. Small plantar spur of calcaneus. Atherosclerotic vascular calcifications are noted. There is dorsal spurring tarsal metatarsal joint. Significant soft tissue swelling noted right third toe. There is osteo lysis and some bony fragmentation at the tip of distal phalanx third third toe. Osteomyelitis cannot be excluded. Further correlation with MRI is recommended. IMPRESSION: No acute fracture or subluxation. Again noted hallux valgus deformity. There is diffuse osteopenia. Small plantar spur of calcaneus. Atherosclerotic vascular calcifications are noted. There is dorsal spurring tarsal metatarsal joint. Significant soft tissue swelling noted right third toe. There is osteo lysis and some bony fragmentation at the tip of distal phalanx third third toe. Osteomyelitis cannot be excluded. Further correlation with MRI is recommended. Electronically Signed   By: Natasha Mead M.D.   On: 08/27/2015 16:34    Lab Data:  CBC:  Recent Labs Lab 08/26/15 0630 08/27/15 0153 08/28/15 0632 08/29/15 0430  WBC 13.7* 15.7* 17.9* 21.8*  HGB 10.8* 11.3* 10.3* 10.4*  HCT 32.2* 34.4* 31.3* 31.2*  MCV 93.3 94.0 94.3 95.4  PLT 335 366 307 267   Basic Metabolic Panel:  Recent Labs Lab 08/26/15 0630 08/27/15 0153 08/28/15 0632 08/29/15 0430  NA 139 141 140 141  K 3.9 3.6 3.6 3.3*  CL 105 107 110 109  CO2 24 20* 20* 23  GLUCOSE 67 56* 71 87  BUN 17 20 17 11   CREATININE 1.99* 2.27* 2.00* 1.50*  CALCIUM 9.9 9.5 8.6* 9.1   GFR: Estimated Creatinine Clearance: 24.2 mL/min (by C-G formula based on Cr of 1.5). Liver Function Tests:  Recent Labs Lab 08/26/15 0630  AST 37  ALT 27  ALKPHOS 118  BILITOT 0.5  PROT 6.6  ALBUMIN 3.4*    Recent Labs Lab 08/26/15 0630  LIPASE 18   No results for input(s): AMMONIA in the last 168 hours. Coagulation Profile: No results for input(s): INR, PROTIME in the last 168 hours. Cardiac Enzymes: No results for input(s): CKTOTAL, CKMB, CKMBINDEX,  TROPONINI in the last 168 hours. BNP (last 3 results) No results for input(s): PROBNP in the last 8760 hours. HbA1C: No results for input(s): HGBA1C in the last 72 hours. CBG:  Recent Labs Lab 08/28/15 1728 08/28/15 2120 08/28/15 2238 08/28/15 2304 08/29/15 0802  GLUCAP 77 64* 66 131* 93   Lipid Profile: No results for input(s): CHOL, HDL, LDLCALC, TRIG, CHOLHDL, LDLDIRECT in the last 72 hours. Thyroid Function Tests:  Recent Labs  08/26/15 1615  TSH 84.851*   Anemia Panel: No results for input(s): VITAMINB12, FOLATE, FERRITIN, TIBC, IRON, RETICCTPCT in the last 72 hours. Urine analysis:    Component Value Date/Time   COLORURINE YELLOW 08/26/2015 0737   APPEARANCEUR CLEAR 08/26/2015 0737   LABSPEC 1.022 08/26/2015 0737   PHURINE 6.5 08/26/2015 0737   GLUCOSEU NEGATIVE 08/26/2015 0737   HGBUR NEGATIVE 08/26/2015 0737   BILIRUBINUR NEGATIVE 08/26/2015 0737   KETONESUR 15* 08/26/2015 0737   PROTEINUR 100* 08/26/2015 0737   UROBILINOGEN 0.2 01/27/2015 2308   NITRITE NEGATIVE 08/26/2015 0737   LEUKOCYTESUR NEGATIVE 08/26/2015 0737     Jarious Lyon M.D. [facility] 08/29/2015, 11:04 AM  Pager: [phone] Between 7am to 7pm - call Pager - [phone]  After 7pm go to [email or web address] - password [email or web address]  Call night coverage person covering after 7pm

## 2015-08-29 NOTE — Consult Note (Addendum)
[facility] Face-to-Face Psychiatry Consult   Reason for Consult: capacity determination Referring Physician:  Dr. Tana Coast Patient Identification: Shannon [name] MRN:  [id number] Principal Diagnosis: Adjustment disorder with anxiety Diagnosis:   Patient Active Problem List   Diagnosis Date Noted  . Adjustment disorder with anxiety [F43.22] 08/29/2015    Priority: High  . Diabetic ulcer of toe of right foot associated with type 2 diabetes mellitus, with necrosis of muscle ([place]) [W80.881, L97.513]   . Diverticulitis of large intestine without perforation or abscess without bleeding [K57.32]   . Colitis [K52.9] 08/26/2015  . Atrial fibrillation ([place]) [I48.91] 08/26/2015  . N&V (nausea and vomiting) [R11.2]   . Acute on chronic diastolic heart failure ([place]) [I50.33] 08/02/2015  . Esophageal candidiasis ([place]) [B37.81]   . Goals of care, counseling/discussion [Z71.89]   . Encounter for hospice care discussion [Z51.5]   . Dyspnea [R06.00]   . SOB (shortness of breath) [R06.02]   . Acute renal failure superimposed on stage 3 chronic kidney disease ([place]) [N17.9, N18.3]   . Encounter for long-term (current) use of other high-risk medications [Z79.899]   . Respiratory distress [R06.00] 07/06/2015  . Atrial flutter with rapid ventricular response ([place]) [I48.92] 07/06/2015  . Dysphagia [R13.10] 07/05/2015  . Asthma with allergic rhinitis [J45.909] 06/04/2015  . Hypertensive heart disease [I11.9] 03/18/2015  . S/P exploratory laparotomy 01/20/15 [Z98.890] 01/28/2015  . Malnutrition of moderate degree [E44.0] 01/28/2015  . Syncope [R55] 01/27/2015  . HCAP (healthcare-associated pneumonia) [J18.9] 01/23/2015  . Fever, unspecified [R50.9] 01/22/2015  . Bradycardia [R00.1] 01/21/2015  . Hypotension [I95.9] 01/21/2015  . Palliative care encounter [Z51.5]   . DNR (do not resuscitate) discussion [Z71.89]   . Protein-calorie malnutrition, severe [E43] 01/12/2015  . Elevated troponin [R79.89] 01/12/2015  . HTN  (hypertension), uncontrolled [I10] 01/12/2015  . AP (abdominal pain) [R10.9] 01/10/2015  . SBO (small bowel obstruction) ([place]) [K56.69] 01/10/2015  . SIADH (syndrome of inappropriate ADH production) ([place]) [E22.2] 01/10/2015  . Cerebrovascular disease [I67.9] 07/14/2014  . Diabetic polyneuropathy associated with type 2 diabetes mellitus ([place]) [E11.42] 07/14/2014  . Hyperlipidemia [E78.5] 07/14/2014  . Gastric distention [K31.89]   . Gastric outlet obstruction [K31.1] 05/21/2014  . Unstable gait [R26.81] 04/29/2014  . Hyponatremia [E87.1] 03/23/2014  . Acute encephalopathy [G93.40] 03/22/2014  . Slurred speech [R47.81]   . Chest pain [R07.9]   . Gastroparesis [K31.84]   . Slow transit constipation [K59.01] 02/03/2014  . Carotid stenosis [I65.29] 02/03/2014  . Demand ischemia ([place]) [I24.8] 01/21/2014  . Gastric bezoar [T18.2XXA] 01/20/2014  . Anemia of chronic disease [D63.8] 01/18/2014  . CKD (chronic kidney disease) stage 3, GFR 30-59 ml/min [N18.3] 01/18/2014  . Depression [F32.9] 09/29/2013  . Perforated ulcer ([place]) [K27.5] 09/29/2013  . Essential hypertension, benign [I10] 09/22/2013  . GERD (gastroesophageal reflux disease) [K21.9] 09/22/2013  . Dyslipidemia [E78.5] 09/22/2013  . DM (diabetes mellitus) type II uncontrolled, periph vascular disorder ([place]) [E11.51, E11.65] 09/22/2013  . A-fib ([place]) [I48.91] 09/22/2013  . CVA (cerebral vascular accident) ([place]) [I63.9] 09/22/2013  . Diabetic neuropathy ([place]) [E11.40] 09/22/2013  . Hypothyroidism [E03.9] 09/22/2013  . Chronic gastrojejunal ulcer with perforation ([place]) [K28.5] 09/07/2013  . Diabetes ([place]) [E11.9] 09/07/2013  . Accelerated hypertension [I10] 09/07/2013  . Systemic lupus erythematosus (SLE) inhibitor [R89.4] 09/07/2013  . Other specified abnormal immunological findings in serum [R76.8] 09/07/2013    Total Time spent with patient: 1 hour  Subjective:   Blessing Zaucha is a 82 y.o. female patient admitted with abdominal  pain.  HPI:  Thanks for asking  me to do a capacity evaluation on Shannon [name], a 82 y.o. Female with multiple medical issues including:Type 2 DM, Chronic normocytic anemia. "Light stroke", not on anticoagulation, Hypothyroidism,CKD stage 3, SIADH and prior history of multiple abdominal surgeries. Patient charts was reviewed and history obtained from patient, her daughter who is her power of [place] daughter who was present in the room while patient was being evaluated. Patent denies any prior history of mental illness except for occasional stress of life and anxiety due to multiple medical problem. She is alert and oriented to time, place, person and situation. Patient denies depression, psychosis, delusional thinking, suicide or homicidal intent or plan. Regarding the amputation of her toe, patient vehemently refused the surgery. She states that she had sought the help of a Podiatrist who recommended wound care instead of Surgery. Patient also reports that the benefits of surgery, alternative and risks of refusing surgery was communicated to her and has chosen to refuse amputation. She demonstrated the understanding of her situation and appreciate the risks involve if she refuses surgery. She and her daughter who is the power of attorney also states that they have both weighed the risks, benefits of surgery but prefer the alternative which is wound care . MMSE was performed on the patient, she score 27/30. Patient does not demonstrate any memory or cognitive impairment at this time, therefore she has capacity to make inform medical decision.   Past Psychiatric History: None reported  Risk to Self: Is patient at risk for suicide?: No Risk to Others:   Prior Inpatient Therapy:   Prior Outpatient Therapy:    Past Medical History:  Past Medical History  Diagnosis Date  . Pneumonia   . Hypertension   . Small bowel obstruction ([place])     "I don't know how many" (01/11/2015)  . Ventral  hernia with bowel obstruction   . Asthma   . Hyperlipidemia   . Stroke ([place])     "light one"  . COPD (chronic obstructive pulmonary disease) ([place])   . Hypothyroidism   . Type II diabetes mellitus ([place])   . Anemia     previous blood transfusions  . GERD (gastroesophageal reflux disease)   . History of stomach ulcers   . Arthritis     "all over"  . Renal insufficiency   . Depression     "light case"  . SIADH (syndrome of inappropriate ADH production) ([place])     [name] 01/10/2015  . Perforated gastric ulcer ([place])   . Gastric stenosis   . Paraesophageal hernia   . Bradycardia     topped BB and amiodarone decreased- junctional rhythm     Past Surgical History  Procedure Laterality Date  . Tubal ligation    . Colectomy    . Hernia repair  2015  . Esophagogastroduodenoscopy N/A 01/19/2014    Procedure: ESOPHAGOGASTRODUODENOSCOPY (EGD);  Surgeon: Irene Shipper, MD;  Location: [facility];  Service: Endoscopy;  Laterality: N/A;  . Esophagogastroduodenoscopy N/A 01/20/2014    Procedure: ESOPHAGOGASTRODUODENOSCOPY (EGD);  Surgeon: Irene Shipper, MD;  Location: [facility];  Service: Endoscopy;  Laterality: N/A;  . Esophagogastroduodenoscopy N/A 03/19/2014    Procedure: ESOPHAGOGASTRODUODENOSCOPY (EGD);  Surgeon: Milus Banister, MD;  Location: [facility];  Service: Endoscopy;  Laterality: N/A;  . Cholecystectomy open    . Ventral hernia repair  2015    incarcerated ventral hernia ([facility] 09/2013)/notes 01/10/2015  . Cataract extraction w/ intraocular lens  implant, bilateral    . Gastrojejunostomy  hx/notes 01/10/2015  . Laparotomy N/A 01/20/2015    Procedure: EXPLORATORY LAPAROTOMY;  Surgeon: Coralie Keens, MD;  Location: [facility];  Service: General;  Laterality: N/A;  . Lysis of adhesion N/A 01/20/2015    Procedure: LYSIS OF ADHESIONS < 1 HOUR;  Surgeon: Coralie Keens, MD;  Location: [facility];  Service: General;  Laterality: N/A;  . Esophagogastroduodenoscopy N/A 07/08/2015     Procedure: ESOPHAGOGASTRODUODENOSCOPY (EGD);  Surgeon: Doran Stabler, MD;  Location[facility] ENDOSCOPY;  Service: Endoscopy;  Laterality: N/A;   Family History:  Family History  Problem Relation Age of Onset  . Stroke Mother   . Heart attack Neg Hx   . Hypertension Mother   . Diabetes Brother    Family Psychiatric  History: Social History:  History  Alcohol Use No     History  Drug Use No    Social History   Social History  . Marital Status: Widowed    Spouse Name: N/A  . Number of Children: 3  . Years of Education: N/A   Social History Main Topics  . Smoking status: Never Smoker   . Smokeless tobacco: Former Systems developer    Types: Snuff  . Alcohol Use: No  . Drug Use: No  . Sexual Activity: No   Other Topics Concern  . None   Social History Narrative   Married in [place], lives in one story house with 2 people and no pets   Occupation: ?   Has a living Will, [place], and doesn't have DNR   Was living with Husband (in frail health) in [place] until 09/2013.  After her [facility] admission they both came to live with daughter in [place] History:    Allergies:  No Known Allergies  Labs:  Results for orders placed or performed during the hospital encounter of 08/26/15 (from the past 48 hour(s))  APTT     Status: Abnormal   Collection Time: 08/27/15 10:49 PM  Result Value Ref Range   aPTT 102 (H) 24 - 37 seconds    Comment:        IF BASELINE aPTT IS ELEVATED, SUGGEST PATIENT RISK ASSESSMENT BE USED TO DETERMINE APPROPRIATE ANTICOAGULANT THERAPY.   C difficile quick scan w PCR reflex     Status: None   Collection Time: 08/28/15  6:14 AM  Result Value Ref Range   C Diff antigen NEGATIVE NEGATIVE   C Diff toxin NEGATIVE NEGATIVE   C Diff interpretation Negative for toxigenic C. difficile   CBC     Status: Abnormal   Collection Time: 08/28/15  6:32 AM  Result Value Ref Range   WBC 17.9 (H) 4.0 - 10.5 K/uL   RBC 3.32 (L) 3.87 - 5.11 MIL/uL    Hemoglobin 10.3 (L) 12.0 - 15.0 g/dL   HCT 31.3 (L) 36.0 - 46.0 %   MCV 94.3 78.0 - 100.0 fL   MCH 31.0 26.0 - 34.0 pg   MCHC 32.9 30.0 - 36.0 g/dL   RDW 17.8 (H) 11.5 - 15.5 %   Platelets 307 150 - 400 K/uL  Basic metabolic panel     Status: Abnormal   Collection Time: 08/28/15  6:32 AM  Result Value Ref Range   Sodium 140 135 - 145 mmol/L   Potassium 3.6 3.5 - 5.1 mmol/L   Chloride 110 101 - 111 mmol/L   CO2 20 (L) 22 - 32 mmol/L   Glucose, Bld 71 65 - 99 mg/dL   BUN 17  6 - 20 mg/dL   Creatinine, Ser 2.00 (H) 0.44 - 1.00 mg/dL   Calcium 8.6 (L) 8.9 - 10.3 mg/dL   GFR calc non Af Amer 21 (L) >60 mL/min   GFR calc Af Amer 24 (L) >60 mL/min    Comment: (NOTE) The eGFR has been calculated using the CKD EPI equation. This calculation has not been validated in all clinical situations. eGFR's persistently <60 mL/min signify possible Chronic Kidney Disease.    Anion gap 10 5 - 15  Heparin level (unfractionated)     Status: Abnormal   Collection Time: 08/28/15  7:24 AM  Result Value Ref Range   Heparin Unfractionated 1.04 (H) 0.30 - 0.70 IU/mL    Comment:        IF HEPARIN RESULTS ARE BELOW EXPECTED VALUES, AND PATIENT DOSAGE HAS BEEN CONFIRMED, SUGGEST FOLLOW UP TESTING OF ANTITHROMBIN III LEVELS.   APTT     Status: Abnormal   Collection Time: 08/28/15  7:24 AM  Result Value Ref Range   aPTT 95 (H) 24 - 37 seconds    Comment:        IF BASELINE aPTT IS ELEVATED, SUGGEST PATIENT RISK ASSESSMENT BE USED TO DETERMINE APPROPRIATE ANTICOAGULANT THERAPY.   Glucose, capillary     Status: Abnormal   Collection Time: 08/28/15 12:26 PM  Result Value Ref Range   Glucose-Capillary 61 (L) 65 - 99 mg/dL  Glucose, capillary     Status: Abnormal   Collection Time: 08/28/15 12:59 PM  Result Value Ref Range   Glucose-Capillary 61 (L) 65 - 99 mg/dL  Glucose, capillary     Status: Abnormal   Collection Time: 08/28/15  1:18 PM  Result Value Ref Range   Glucose-Capillary 62 (L) 65 - 99  mg/dL  Glucose, capillary     Status: None   Collection Time: 08/28/15  1:59 PM  Result Value Ref Range   Glucose-Capillary 71 65 - 99 mg/dL  Glucose, capillary     Status: None   Collection Time: 08/28/15  5:28 PM  Result Value Ref Range   Glucose-Capillary 77 65 - 99 mg/dL  Glucose, capillary     Status: Abnormal   Collection Time: 08/28/15  9:20 PM  Result Value Ref Range   Glucose-Capillary 64 (L) 65 - 99 mg/dL  Glucose, capillary     Status: None   Collection Time: 08/28/15 10:38 PM  Result Value Ref Range   Glucose-Capillary 66 65 - 99 mg/dL  Glucose, capillary     Status: Abnormal   Collection Time: 08/28/15 11:04 PM  Result Value Ref Range   Glucose-Capillary 131 (H) 65 - 99 mg/dL  CBC     Status: Abnormal   Collection Time: 08/29/15  4:30 AM  Result Value Ref Range   WBC 21.8 (H) 4.0 - 10.5 K/uL   RBC 3.27 (L) 3.87 - 5.11 MIL/uL   Hemoglobin 10.4 (L) 12.0 - 15.0 g/dL   HCT 31.2 (L) 36.0 - 46.0 %   MCV 95.4 78.0 - 100.0 fL   MCH 31.8 26.0 - 34.0 pg   MCHC 33.3 30.0 - 36.0 g/dL   RDW 17.9 (H) 11.5 - 15.5 %   Platelets 267 150 - 400 K/uL  Basic metabolic panel     Status: Abnormal   Collection Time: 08/29/15  4:30 AM  Result Value Ref Range   Sodium 141 135 - 145 mmol/L   Potassium 3.3 (L) 3.5 - 5.1 mmol/L   Chloride 109 101 - 111 mmol/L  CO2 23 22 - 32 mmol/L   Glucose, Bld 87 65 - 99 mg/dL   BUN 11 6 - 20 mg/dL   Creatinine, Ser 1.50 (H) 0.44 - 1.00 mg/dL   Calcium 9.1 8.9 - 10.3 mg/dL   GFR calc non Af Amer 29 (L) >60 mL/min   GFR calc Af Amer 34 (L) >60 mL/min    Comment: (NOTE) The eGFR has been calculated using the CKD EPI equation. This calculation has not been validated in all clinical situations. eGFR's persistently <60 mL/min signify possible Chronic Kidney Disease.    Anion gap 9 5 - 15  Heparin level (unfractionated)     Status: Abnormal   Collection Time: 08/29/15  4:30 AM  Result Value Ref Range   Heparin Unfractionated 0.91 (H) 0.30 - 0.70  IU/mL    Comment:        IF HEPARIN RESULTS ARE BELOW EXPECTED VALUES, AND PATIENT DOSAGE HAS BEEN CONFIRMED, SUGGEST FOLLOW UP TESTING OF ANTITHROMBIN III LEVELS.   APTT     Status: Abnormal   Collection Time: 08/29/15  4:30 AM  Result Value Ref Range   aPTT 181 (H) 24 - 37 seconds    Comment:        IF BASELINE aPTT IS ELEVATED, SUGGEST PATIENT RISK ASSESSMENT BE USED TO DETERMINE APPROPRIATE ANTICOAGULANT THERAPY.   Glucose, capillary     Status: None   Collection Time: 08/29/15  8:02 AM  Result Value Ref Range   Glucose-Capillary 93 65 - 99 mg/dL  Glucose, capillary     Status: None   Collection Time: 08/29/15 12:05 PM  Result Value Ref Range   Glucose-Capillary 81 65 - 99 mg/dL    Current Facility-Administered Medications  Medication Dose Route Frequency Provider Last Rate Last Dose  . acetaminophen (TYLENOL) tablet 650 mg  650 mg Oral Q6H PRN Willia Craze, NP       Or  . acetaminophen (TYLENOL) suppository 650 mg  650 mg Rectal Q6H PRN Willia Craze, NP      . albuterol (PROVENTIL) (2.5 MG/3ML) 0.083% nebulizer solution 2.5 mg  2.5 mg Nebulization Q2H PRN Geradine Girt, DO      . amiodarone (PACERONE) tablet 100 mg  100 mg Oral Daily Ripudeep Krystal Eaton, MD   100 mg at 08/29/15 0858  . amLODipine (NORVASC) tablet 10 mg  10 mg Oral Daily Ripudeep Krystal Eaton, MD   10 mg at 08/29/15 0857  . atorvastatin (LIPITOR) tablet 10 mg  10 mg Oral q1800 Ripudeep Krystal Eaton, MD   10 mg at 08/28/15 1757  . bisacodyl (DULCOLAX) suppository 10 mg  10 mg Rectal Daily PRN Willia Craze, NP      . calcium-vitamin D (OSCAL WITH D) 500-200 MG-UNIT per tablet 1 tablet  1 tablet Oral Q breakfast Ripudeep Krystal Eaton, MD   1 tablet at 08/29/15 0847  . cycloSPORINE (RESTASIS) 0.05 % ophthalmic emulsion 1 drop  1 drop Both Eyes BID Ripudeep Krystal Eaton, MD   1 drop at 08/29/15 0901  . dextrose (GLUTOSE) 40 % oral gel 37.5 g  1 Tube Oral Once Dianne Dun, NP   37.5 g at 08/28/15 2256  . dextrose 5  %-0.45 % sodium chloride infusion   Intravenous Continuous Ripudeep Krystal Eaton, MD   Stopped at 08/28/15 2257  . fluticasone (FLONASE) 50 MCG/ACT nasal spray 2 spray  2 spray Each Nare Daily Willia Craze, NP   2 spray at 08/29/15 0900  .  gabapentin (NEURONTIN) capsule 300 mg  300 mg Oral BID Ripudeep K Rai, MD   300 mg at 08/29/15 0857  . heparin ADULT infusion 100 units/mL (25000 units/225m sodium chloride 0.45%)  600 Units/hr Intravenous Continuous CRebecka Apley RPH 6 mL/hr at 08/29/15 0709 600 Units/hr at 08/29/15 0709  . hydrALAZINE (APRESOLINE) injection 10 mg  10 mg Intravenous Q6H PRN PWillia Craze NP   10 mg at 08/29/15 0857  . hydrALAZINE (APRESOLINE) tablet 50 mg  50 mg Oral TID Ripudeep KKrystal Eaton MD   50 mg at 08/29/15 1206  . insulin aspart (novoLOG) injection 0-5 Units  0-5 Units Subcutaneous QHS Ripudeep K Rai, MD   0 Units at 08/28/15 2200  . insulin aspart (novoLOG) injection 0-9 Units  0-9 Units Subcutaneous TID WC Ripudeep KKrystal Eaton MD   0 Units at 08/28/15 1234  . ipratropium-albuterol (DUONEB) 0.5-2.5 (3) MG/3ML nebulizer solution 3 mL  3 mL Nebulization TID Ripudeep KKrystal Eaton MD   3 mL at 08/29/15 0944  . isosorbide mononitrate (IMDUR) 24 hr tablet 30 mg  30 mg Oral Daily Ripudeep K Rai, MD   30 mg at 08/29/15 1205  . lactulose (CHRONULAC) 10 GM/15ML solution 10 g  10 g Oral Daily PRN Ripudeep K Rai, MD      . latanoprost (XALATAN) 0.005 % ophthalmic solution 1 drop  1 drop Both Eyes QHS Ripudeep K Rai, MD   1 drop at 08/28/15 2113  . [START ON 08/30/2015] levothyroxine (SYNTHROID, LEVOTHROID) tablet 88 mcg  88 mcg Oral QAC breakfast Ripudeep K Rai, MD      . metroNIDAZOLE (FLAGYL) IVPB 500 mg  500 mg Intravenous Q8H Jessica U Vann, DO   500 mg at 08/29/15 1159  . ondansetron (ZOFRAN) tablet 4 mg  4 mg Oral Q6H PRN PWillia Craze NP       Or  . ondansetron ([facility] injection 4 mg  4 mg Intravenous Q6H PRN PWillia Craze NP   4 mg at 08/27/15 0602  . ondansetron (ZOFRAN)  injection 4 mg  4 mg Intravenous BID DMilus Banister MD   4 mg at 08/27/15 2231  . pantoprazole (PROTONIX) injection 40 mg  40 mg Intravenous Q24H JGeradine Girt DO   40 mg at 08/27/15 2231  . piperacillin-tazobactam (ZOSYN) IVPB 3.375 g  3.375 g Intravenous Q8H Alyson NGillian Shields RPH      . polyethylene glycol (MIRALAX / GLYCOLAX) packet 17 g  17 g Oral BID Ripudeep KKrystal Eaton MD   17 g at 08/29/15 1156  . polyvinyl alcohol (LIQUIFILM TEARS) 1.4 % ophthalmic solution 1 drop  1 drop Both Eyes QID Ripudeep K Rai, MD   1 drop at 08/29/15 0900  . senna-docusate (Senokot-S) tablet 1 tablet  1 tablet Oral BID Ripudeep KKrystal Eaton MD   1 tablet at 08/29/15 0859  . sertraline (ZOLOFT) tablet 100 mg  100 mg Oral Daily Ripudeep KKrystal Eaton MD   100 mg at 08/29/15 0859  . simethicone (MYLICON) chewable tablet 80 mg  80 mg Oral QID PRN Ripudeep K Rai, MD      . vancomycin (VANCOCIN) IVPB 750 mg/150 ml premix  750 mg Intravenous Q24H ADarl Pikes [facility]       Musculoskeletal: Strength & Muscle Tone: within normal limits Gait & Station: not tested Patient leans: N/A  Psychiatric Specialty Exam: Physical Exam  Psychiatric: Her speech is normal and behavior is normal. Judgment and thought content normal. Her  mood appears anxious. Cognition and memory are normal.    Review of Systems  Constitutional: Negative.   HENT: Negative.   Respiratory: Negative.   Cardiovascular: Negative.   Gastrointestinal: Positive for nausea and abdominal pain.  Genitourinary: Negative.   Skin: Negative.   Neurological: Negative.   Endo/Heme/Allergies: Negative.   Psychiatric/Behavioral: The patient is nervous/anxious.     Blood pressure 165/67, pulse 81, temperature 98.5 F (36.9 C), temperature source Oral, resp. rate 16, height '5\' 5"'  (1.651 m), weight 68.357 kg (150 lb 11.2 oz), SpO2 96 %.Body mass index is 25.08 kg/(m^2).  General Appearance: Casual  Eye Contact:  Good  Speech:  Clear and Coherent  Volume:  Normal  Mood:   Negative  Affect:  anxious  Thought Process:  Coherent  Orientation:  Full (Time, Place, and Person)  Thought Content:  Logical  Suicidal Thoughts:  No  Homicidal Thoughts:  No  Memory:  Immediate;   Good Recent;   Fair Remote;   Good  Judgement:  Fair  Insight:  Fair  Psychomotor Activity:  Normal  Concentration:  Concentration: Good and Attention Span: Good  Recall:  [place] of Knowledge:  Good  Language:  Good  Akathisia:  No  Handed:  Right  AIMS (if indicated):     Assets:  Communication Skills Desire for Improvement Financial Resources/Insurance Housing Social Support  ADL's:  Intact  Cognition:  WNL  Sleep:   fair     Treatment Plan Summary: Recommendation: -Patient has capacity to make informed medical decision at this time. -Contact patient's daughter Soledad Gerlach who is her power of Attorney for further clarification.  Disposition: No evidence of imminent risk to self or others at present.   Supportive therapy provided about ongoing stressors. Patient has capacity to make inform medical decision at this point in time.  Corena Pilgrim, MD 08/29/2015 1:06 PM

## 2015-08-29 NOTE — Progress Notes (Signed)
ANTICOAGULATION CONSULT NOTE  Pharmacy Consult for heparin Indication: atrial fibrillation  No Known Allergies  Patient Measurements: Height: 5\' 5"  (165.1 cm) Weight: 150 lb 11.2 oz (68.357 kg) IBW/kg (Calculated) : 57 Heparin Dosing Weight: 68.4  Vital Signs: Temp: 98.5 F (36.9 C) (05/28 0529) Temp Source: Oral (05/28 0529) BP: 152/58 mmHg (05/28 0529) Pulse Rate: 81 (05/28 0529)  Labs:  Recent Labs  08/27/15 0153  08/27/15 2249 08/28/15 [id number] 08/28/15 0724 08/29/15 0430  HGB 11.3*  --   --  10.3*  --  10.4*  HCT 34.4*  --   --  31.3*  --  31.2*  PLT 366  --   --  307  --  267  APTT 92*  < > 102*  --  95* 181*  HEPARINUNFRC 1.10*  --   --   --  1.04* 0.91*  CREATININE 2.27*  --   --  2.00*  --  1.50*  < > = values in this interval not displayed.  Estimated Creatinine Clearance: 24.2 mL/min (by C-G formula based on Cr of 1.5).   Medical History: Past Medical History  Diagnosis Date  . Pneumonia   . Hypertension   . Small bowel obstruction ([place])     "I don't know how many" (01/11/2015)  . Ventral hernia with bowel obstruction   . Asthma   . Hyperlipidemia   . Stroke ([place])     "light one"  . COPD (chronic obstructive pulmonary disease) ([place])   . Hypothyroidism   . Type II diabetes mellitus ([place])   . Anemia     previous blood transfusions  . GERD (gastroesophageal reflux disease)   . History of stomach ulcers   . Arthritis     "all over"  . Renal insufficiency   . Depression     "light case"  . SIADH (syndrome of inappropriate ADH production) ([place])     [name] 01/10/2015  . Perforated gastric ulcer ([place])   . Gastric stenosis   . Paraesophageal hernia   . Bradycardia     topped BB and amiodarone decreased- junctional rhythm     Medications:  Scheduled:  . amiodarone  100 mg Oral Daily  . amLODipine  10 mg Oral Daily  . atorvastatin  10 mg Oral q1800  . calcium-vitamin D  1 tablet Oral Q breakfast  . cycloSPORINE  1 drop Both Eyes BID  .  dextrose  1 Tube Oral Once  . fluticasone  2 spray Each Nare Daily  . gabapentin  300 mg Oral BID  . insulin aspart  0-5 Units Subcutaneous QHS  . insulin aspart  0-9 Units Subcutaneous TID WC  . ipratropium-albuterol  3 mL Nebulization TID  . latanoprost  1 drop Both Eyes QHS  . levothyroxine  44 mcg Intravenous Q breakfast  . metronidazole  500 mg Intravenous Q8H  . ondansetron  4 mg Intravenous BID  . pantoprazole (PROTONIX) IV  40 mg Intravenous Q24H  . polyethylene glycol  17 g Oral BID  . polyvinyl alcohol  1 drop Both Eyes QID  . senna-docusate  1 tablet Oral BID  . sertraline  100 mg Oral Daily  . [START ON 08/30/2015] vancomycin  1,000 mg Intravenous Q48H    Assessment: 82 yo woman admitted 08/26/2015 with abdominal pain on apixaban PTA for hx AFlutter. CHADSVASc at least 5. Pharmacy consulted to dose heparin.  Last dose apixaban 5/24 at unknown time. Will monitor both aPTT and anti-Xa until both correlate.   This  morning's aPTT and HL are supratherapeutic at 181 sec and 0.91, respectively, on heparin 800 units/hr. Nurse reports no issues with infusion or bleeding and notes that level was drawn from opposite arm.   Goal of Therapy:  Heparin level 0.3-0.7 units/ml aPTT 66-102 seconds Monitor platelets by anticoagulation protocol: Yes   Plan:  Hold heparin for 1 hour and reduce rate to 600 units/hr 8h aPTT/HL Daily aPTT/HL Monitor s/sx of bleeding   Andrey Cota. Diona Foley, PharmD, [place] Clinical Pharmacist Pager [phone] 08/29/2015 5:43 AM

## 2015-08-29 NOTE — Progress Notes (Signed)
[facility] Progress Note    Since last GI note: Tolerating full liquids without nausea or vomiting. Feels full.  BMs +.  Objective: Vital signs in last 24 hours: Temp:  [98.3 F (36.8 C)-98.5 F (36.9 C)] 98.5 F (36.9 C) (05/28 0529) Pulse Rate:  [75-85] 81 (05/28 0529) Resp:  [16-19] 16 (05/28 0529) BP: (152-191)/(58-75) 191/75 mmHg (05/28 0857) SpO2:  [94 %-100 %] 96 % (05/28 0945) Last BM Date: 08/29/15 General: alert and oriented times 3 Heart: regular rate and rythm Abdomen: soft, non-tender, non-distended, normal bowel sounds   Lab Results:  Recent Labs  08/27/15 0153 08/28/15 0632 08/29/15 0430  WBC 15.7* 17.9* 21.8*  HGB 11.3* 10.3* 10.4*  PLT 366 307 267  MCV 94.0 94.3 95.4    Recent Labs  08/27/15 0153 08/28/15 0632 08/29/15 0430  NA 141 140 141  K 3.6 3.6 3.3*  CL 107 110 109  CO2 20* 20* 23  GLUCOSE 56* 71 87  BUN 20 17 11   CREATININE 2.27* 2.00* 1.50*  CALCIUM 9.5 8.6* 9.1   No results for input(s): PROT, ALBUMIN, AST, ALT, ALKPHOS, BILITOT, BILIDIR, IBILI in the last 72 hours. No results for input(s): INR in the last 72 hours.   Studies/Results: Dg Foot Complete Right  08/27/2015  CLINICAL DATA:  Diabetic ulcer right foot, muscle necrosis, please evaluate bony changes at distal phalanx right third toe EXAM: RIGHT FOOT COMPLETE - 3+ VIEW COMPARISON:  08/17/2015 and 08/26/2014 FINDINGS: Three views of the right foot submitted. No acute fracture or subluxation. Again noted hallux valgus deformity. There is diffuse osteopenia. Small plantar spur of calcaneus. Atherosclerotic vascular calcifications are noted. There is dorsal spurring tarsal metatarsal joint. Significant soft tissue swelling noted right third toe. There is osteo lysis and some bony fragmentation at the tip of distal phalanx third third toe. Osteomyelitis cannot be excluded. Further correlation with MRI is recommended. IMPRESSION: No acute fracture or subluxation. Again  noted hallux valgus deformity. There is diffuse osteopenia. Small plantar spur of calcaneus. Atherosclerotic vascular calcifications are noted. There is dorsal spurring tarsal metatarsal joint. Significant soft tissue swelling noted right third toe. There is osteo lysis and some bony fragmentation at the tip of distal phalanx third third toe. Osteomyelitis cannot be excluded. Further correlation with MRI is recommended. Electronically Signed   By: Lahoma Crocker M.D.   On: 08/27/2015 16:34     Medications: Scheduled Meds: . amiodarone  100 mg Oral Daily  . amLODipine  10 mg Oral Daily  . atorvastatin  10 mg Oral q1800  . calcium-vitamin D  1 tablet Oral Q breakfast  . cycloSPORINE  1 drop Both Eyes BID  . dextrose  1 Tube Oral Once  . fluticasone  2 spray Each Nare Daily  . gabapentin  300 mg Oral BID  . insulin aspart  0-5 Units Subcutaneous QHS  . insulin aspart  0-9 Units Subcutaneous TID WC  . ipratropium-albuterol  3 mL Nebulization TID  . latanoprost  1 drop Both Eyes QHS  . levothyroxine  44 mcg Intravenous Q breakfast  . metronidazole  500 mg Intravenous Q8H  . ondansetron  4 mg Intravenous BID  . pantoprazole (PROTONIX) IV  40 mg Intravenous Q24H  . piperacillin-tazobactam (ZOSYN)  IV  3.375 g Intravenous Q8H  . polyethylene glycol  17 g Oral BID  . polyvinyl alcohol  1 drop Both Eyes QID  . senna-docusate  1 tablet Oral BID  . sertraline  100 mg Oral Daily  .  vancomycin  750 mg Intravenous Q24H   Continuous Infusions: . dextrose 5 % and 0.45% NaCl Stopped (08/28/15 2257)  . heparin 600 Units/hr (08/29/15 0709)   PRN Meds:.acetaminophen **OR** acetaminophen, albuterol, bisacodyl, hydrALAZINE, lactulose, ondansetron **OR** ondansetron (ZOFRAN) IV, simethicone    Assessment/Plan: 82 y.o. female acute on chronic nausea, vomiting that is multifactorial  Will order puree diet (this is her usual diet at home and should not try to advance further). She should stay on PPI once  daily, zofran orally twice daily (both on scheduled basis).   Please call or page with any further questions or concerns.     Milus Banister, MD  08/29/2015, 10:44 AM [facility] Pager [phone]

## 2015-08-29 NOTE — Progress Notes (Signed)
ANTICOAGULATION CONSULT NOTE  Pharmacy Consult for heparin Indication: atrial fibrillation  No Known Allergies  Patient Measurements: Height: 5\' 5"  (165.1 cm) Weight: 150 lb 11.2 oz (68.357 kg) IBW/kg (Calculated) : 57 Heparin Dosing Weight: 68.4  Vital Signs: Temp: 99.4 F (37.4 C) (05/28 1609) Temp Source: Oral (05/28 1609) BP: 155/64 mmHg (05/28 1711) Pulse Rate: 86 (05/28 1609)  Labs:  Recent Labs  08/27/15 0153  08/28/15 [id number] 08/28/15 0724 08/29/15 0430 08/29/15 1553  HGB 11.3*  --  10.3*  --  10.4*  --   HCT 34.4*  --  31.3*  --  31.2*  --   PLT 366  --  307  --  267  --   APTT 92*  < >  --  95* 181* 48*  HEPARINUNFRC 1.10*  --   --  1.04* 0.91* 0.23*  CREATININE 2.27*  --  2.00*  --  1.50*  --   < > = values in this interval not displayed.  Estimated Creatinine Clearance: 24.2 mL/min (by C-G formula based on Cr of 1.5).   Medical History: Past Medical History  Diagnosis Date  . Pneumonia   . Hypertension   . Small bowel obstruction ([place])     "I don't know how many" (01/11/2015)  . Ventral hernia with bowel obstruction   . Asthma   . Hyperlipidemia   . Stroke ([place])     "light one"  . COPD (chronic obstructive pulmonary disease) ([place])   . Hypothyroidism   . Type II diabetes mellitus ([place])   . Anemia     previous blood transfusions  . GERD (gastroesophageal reflux disease)   . History of stomach ulcers   . Arthritis     "all over"  . Renal insufficiency   . Depression     "light case"  . SIADH (syndrome of inappropriate ADH production) ([place])     [name] 01/10/2015  . Perforated gastric ulcer ([place])   . Gastric stenosis   . Paraesophageal hernia   . Bradycardia     topped BB and amiodarone decreased- junctional rhythm     Medications:  Scheduled:  . amiodarone  100 mg Oral Daily  . amLODipine  10 mg Oral Daily  . atorvastatin  10 mg Oral q1800  . calcium-vitamin D  1 tablet Oral Q breakfast  . cycloSPORINE  1 drop Both Eyes BID  .  dextrose  1 Tube Oral Once  . fluticasone  2 spray Each Nare Daily  . gabapentin  300 mg Oral BID  . hydrALAZINE  50 mg Oral TID  . insulin aspart  0-5 Units Subcutaneous QHS  . insulin aspart  0-9 Units Subcutaneous TID WC  . ipratropium-albuterol  3 mL Nebulization TID  . isosorbide mononitrate  30 mg Oral Daily  . latanoprost  1 drop Both Eyes QHS  . [START ON 08/30/2015] levothyroxine  88 mcg Oral QAC breakfast  . metronidazole  500 mg Intravenous Q8H  . ondansetron  4 mg Intravenous BID  . pantoprazole (PROTONIX) IV  40 mg Intravenous Q24H  . piperacillin-tazobactam (ZOSYN)  IV  3.375 g Intravenous Q8H  . polyethylene glycol  17 g Oral BID  . polyvinyl alcohol  1 drop Both Eyes QID  . senna-docusate  1 tablet Oral BID  . sertraline  100 mg Oral Daily  . vancomycin  750 mg Intravenous Q24H    Assessment: 82 yo woman admitted 08/26/2015 with abdominal pain on apixaban PTA for hx AFlutter. CHADSVASc at least  5. Pharmacy consulted to dose heparin.  Last dose apixaban 5/24 at unknown time.   This morning's aPTT and HL are now SUBtherapeutic. aptt and HL appear to be correlating.  Goal of Therapy:  Heparin level 0.3-0.7 units/ml aPTT 66-102 seconds Monitor platelets by anticoagulation protocol: Yes   Plan:  Increase heparin to 700 units/hr Next level with AM labs Dc aPTTs Daily HL, CBC    Hughes Better, PharmD, BCPS Clinical Pharmacist 08/29/2015 5:28 PM

## 2015-08-29 NOTE — Progress Notes (Signed)
Subjective: R 3rd toe ulcer  Pt comfortable, no c/o pain, inquiring about MRI (report pending) No significant pain at the the toe, does have hx of neuropathy  Objective: Vital signs in last 24 hours: Temp:  [98.3 F (36.8 C)-98.5 F (36.9 C)] 98.5 F (36.9 C) (05/28 0529) Pulse Rate:  [75-85] 81 (05/28 0529) Resp:  [16-19] 16 (05/28 0529) BP: (152-191)/(58-75) 191/75 mmHg (05/28 0857) SpO2:  [94 %-100 %] 94 % (05/28 0529)  Intake/Output from previous day: 05/27 0701 - 05/28 0700 In: 2281 [P.O.:300; I.V.:1581; IV Piggyback:400] Out: 200 [Urine:200] Intake/Output this shift:     Recent Labs  08/27/15 0153 08/28/15 0632 08/29/15 0430  HGB 11.3* 10.3* 10.4*    Recent Labs  08/28/15 0632 08/29/15 0430  WBC 17.9* 21.8*  RBC 3.32* 3.27*  HCT 31.3* 31.2*  PLT 307 267    Recent Labs  08/28/15 0632 08/29/15 0430  NA 140 141  K 3.6 3.3*  CL 110 109  CO2 20* 23  BUN 17 11  CREATININE 2.00* 1.50*  GLUCOSE 71 87  CALCIUM 8.6* 9.1   No results for input(s): LABPT, INR in the last 72 hours.  Neurologically intact ABD soft Neurovascular intact Sensation intact distally Intact pulses distally Dorsiflexion/Plantar flexion intact Compartment soft R 3rd toe swelling, non-draining ulcer tip of right 3rd toe, nontender  Assessment/Plan: R 3rd toe ulcer Diabetic neuropathy  MRI report pending, reviewed by Dr. Tonita Cong, findings consistent with osteomyelitis of the toe, no abscess noted Dr. Tonita Cong to discuss with Dr. Doran Durand for tx plan, amputation vs. Continued abx Continue abx  Possible ID consult   Jakeria Caissie M. 08/29/2015, 9:21 AM

## 2015-08-30 LAB — BASIC METABOLIC PANEL
Anion gap: 8 (ref 5–15)
BUN: 10 mg/dL (ref 6–20)
CO2: 23 mmol/L (ref 22–32)
Calcium: 8.8 mg/dL — ABNORMAL LOW (ref 8.9–10.3)
Chloride: 108 mmol/L (ref 101–111)
Creatinine, Ser: 1.59 mg/dL — ABNORMAL HIGH (ref 0.44–1.00)
GFR calc Af Amer: 32 mL/min — ABNORMAL LOW (ref >60)
GFR calc non Af Amer: 27 mL/min — ABNORMAL LOW (ref >60)
Glucose, Bld: 104 mg/dL — ABNORMAL HIGH (ref 65–99)
Potassium: 3.7 mmol/L (ref 3.5–5.1)
Sodium: 139 mmol/L (ref 135–145)

## 2015-08-30 LAB — GLUCOSE, CAPILLARY
Glucose-Capillary: 84 mg/dL (ref 65–99)
Glucose-Capillary: 90 mg/dL (ref 65–99)
Glucose-Capillary: 89 mg/dL (ref 65–99)
Glucose-Capillary: 82 mg/dL (ref 65–99)

## 2015-08-30 LAB — CBC
HCT: 28.4 % — ABNORMAL LOW (ref 36.0–46.0)
Hemoglobin: 9.5 g/dL — ABNORMAL LOW (ref 12.0–15.0)
MCH: 31.6 pg (ref 26.0–34.0)
MCHC: 33.5 g/dL (ref 30.0–36.0)
MCV: 94.4 fL (ref 78.0–100.0)
Platelets: 275 10*3/uL (ref 150–400)
RBC: 3.01 MIL/uL — ABNORMAL LOW (ref 3.87–5.11)
RDW: 18.2 % — ABNORMAL HIGH (ref 11.5–15.5)
WBC: 22.3 10*3/uL — ABNORMAL HIGH (ref 4.0–10.5)

## 2015-08-30 LAB — HEPARIN LEVEL (UNFRACTIONATED)
Heparin Unfractionated: 0.28 IU/mL — ABNORMAL LOW (ref 0.30–0.70)
Heparin Unfractionated: 0.35 IU/mL (ref 0.30–0.70)

## 2015-08-30 MED ORDER — PROCHLORPERAZINE EDISYLATE 5 MG/ML IJ SOLN
5.0000 mg | Freq: Once | INTRAMUSCULAR | Status: AC
  Administered 2015-08-30: 5 mg via INTRAVENOUS

## 2015-08-30 MED ORDER — IPRATROPIUM-ALBUTEROL 0.5-2.5 (3) MG/3ML IN SOLN
3.0000 mL | RESPIRATORY_TRACT | Status: DC | PRN
  Administered 2015-09-02: 3 mL via RESPIRATORY_TRACT
  Filled 2015-08-30: qty 3

## 2015-08-30 MED ORDER — SIMETHICONE 80 MG PO CHEW
80.0000 mg | CHEWABLE_TABLET | Freq: Four times a day (QID) | ORAL | Status: DC
  Administered 2015-08-30 – 2015-09-05 (×25): 80 mg via ORAL
  Filled 2015-08-30 (×24): qty 1

## 2015-08-30 MED ORDER — IPRATROPIUM-ALBUTEROL 0.5-2.5 (3) MG/3ML IN SOLN
3.0000 mL | Freq: Three times a day (TID) | RESPIRATORY_TRACT | Status: DC
Start: 2015-08-30 — End: 2015-09-01
  Administered 2015-08-31 – 2015-09-01 (×3): 3 mL via RESPIRATORY_TRACT
  Filled 2015-08-30 (×5): qty 3

## 2015-08-30 MED ORDER — IPRATROPIUM-ALBUTEROL 0.5-2.5 (3) MG/3ML IN SOLN: 3.0000 mL | RESPIRATORY_TRACT | Status: DC

## 2015-08-30 MED ORDER — POLYETHYLENE GLYCOL 3350 17 G PO PACK
17.0000 g | PACK | Freq: Every day | ORAL | Status: DC
  Administered 2015-08-31 – 2015-09-05 (×6): 17 g via ORAL
  Filled 2015-08-30 (×5): qty 1

## 2015-08-30 MED ORDER — FUROSEMIDE 10 MG/ML IJ SOLN
20.0000 mg | Freq: Once | INTRAMUSCULAR | Status: AC
  Administered 2015-08-30: 20 mg via INTRAVENOUS
  Filled 2015-08-30: qty 2

## 2015-08-30 NOTE — Progress Notes (Signed)
Subjective: Pt denies any pain in the right 3rd toe.  MRI and vascular consult results reviewed.   Objective: Vital signs in last 24 hours: Temp:  [98.8 F (37.1 C)-99.6 F (37.6 C)] 98.8 F (37.1 C) (05/29 0404) Pulse Rate:  [78-89] 78 (05/29 0404) Resp:  [16-20] 16 (05/29 0404) BP: (143-165)/(64-69) 143/68 mmHg (05/29 0404) SpO2:  [91 %-96 %] 94 % (05/29 0404)  Intake/Output from previous day: 05/28 0701 - 05/29 0700 In: 1575 [P.O.:440; I.V.:685; IV Piggyback:450] Out: 300 [Urine:300] Intake/Output this shift: Total I/O In: 100 [P.O.:100] Out: -    Recent Labs  08/28/15 0632 08/29/15 0430 08/30/15 0357  HGB 10.3* 10.4* 9.5*    Recent Labs  08/29/15 0430 08/30/15 0357  WBC 21.8* 22.3*  RBC 3.27* 3.01*  HCT 31.2* 28.4*  PLT 267 275    Recent Labs  08/29/15 0430 08/30/15 0357  NA 141 139  K 3.3* 3.7  CL 109 108  CO2 23 23  BUN 11 10  CREATININE 1.50* 1.59*  GLUCOSE 87 104*  CALCIUM 9.1 8.8*   No results for input(s): LABPT, INR in the last 72 hours.    Assessment/Plan: R 3rd toe diabetic ulcer and osteomyelitis - I phoned the patient's daughter, Soledad Gerlach [facility]), and explained the results of the MRI in detail.  In my opinion, amputation of the infected toe is the only cure for the osteomyelitis of that toe.  The patient and her daughter decline that treatment option.  They prefer to continue abx.  I believe suppression is reasonable at this time since the patient appears medically stable.  I'll sign off and be available for f/u as an outpatient.  O/w she can follow with her podiatrist.   Wylene Simmer 08/30/2015, 9:08 AM

## 2015-08-30 NOTE — Progress Notes (Signed)
ANTICOAGULATION CONSULT NOTE  Pharmacy Consult for heparin Indication: atrial fibrillation  No Known Allergies  Patient Measurements: Height: 5\' 5"  (165.1 cm) Weight: 150 lb 11.2 oz (68.357 kg) IBW/kg (Calculated) : 57 Heparin Dosing Weight: 68.4  Vital Signs: Temp: 98.8 F (37.1 C) (05/29 0404) Temp Source: Oral (05/29 0404) BP: 143/68 mmHg (05/29 0404) Pulse Rate: 78 (05/29 0404)  Labs:  Recent Labs  08/28/15 [id number]  08/28/15 0724 08/29/15 0430 08/29/15 1553 08/30/15 0357 08/30/15 1258  HGB 10.3*  --   --  10.4*  --  9.5*  --   HCT 31.3*  --   --  31.2*  --  28.4*  --   PLT 307  --   --  267  --  275  --   APTT  --   --  95* 181* 48*  --   --   HEPARINUNFRC  --   < > 1.04* 0.91* 0.23* 0.28* 0.35  CREATININE 2.00*  --   --  1.50*  --  1.59*  --   < > = values in this interval not displayed.  Estimated Creatinine Clearance: 22.9 mL/min (by C-G formula based on Cr of 1.59).   Medical History: Past Medical History  Diagnosis Date  . Pneumonia   . Hypertension   . Small bowel obstruction ([place])     "I don't know how many" (01/11/2015)  . Ventral hernia with bowel obstruction   . Asthma   . Hyperlipidemia   . Stroke ([place])     "light one"  . COPD (chronic obstructive pulmonary disease) ([place])   . Hypothyroidism   . Type II diabetes mellitus ([place])   . Anemia     previous blood transfusions  . GERD (gastroesophageal reflux disease)   . History of stomach ulcers   . Arthritis     "all over"  . Renal insufficiency   . Depression     "light case"  . SIADH (syndrome of inappropriate ADH production) ([place])     [name] 01/10/2015  . Perforated gastric ulcer ([place])   . Gastric stenosis   . Paraesophageal hernia   . Bradycardia     topped BB and amiodarone decreased- junctional rhythm     Medications:  Scheduled:  . amiodarone  100 mg Oral Daily  . amLODipine  10 mg Oral Daily  . atorvastatin  10 mg Oral q1800  . calcium-vitamin D  1 tablet Oral Q breakfast  .  cycloSPORINE  1 drop Both Eyes BID  . dextrose  1 Tube Oral Once  . fluticasone  2 spray Each Nare Daily  . gabapentin  300 mg Oral BID  . hydrALAZINE  50 mg Oral TID  . ipratropium-albuterol  3 mL Nebulization TID  . isosorbide mononitrate  30 mg Oral Daily  . latanoprost  1 drop Both Eyes QHS  . levothyroxine  88 mcg Oral QAC breakfast  . metronidazole  500 mg Intravenous Q8H  . ondansetron  4 mg Intravenous BID  . pantoprazole (PROTONIX) IV  40 mg Intravenous Q24H  . polyethylene glycol  17 g Oral Daily  . polyvinyl alcohol  1 drop Both Eyes QID  . prochlorperazine  5 mg Intravenous Once  . sertraline  100 mg Oral Daily  . simethicone  80 mg Oral QID  . vancomycin  750 mg Intravenous Q24H    Assessment: 82 yo woman admitted 08/26/2015 with abdominal pain on apixaban PTA for hx AFlutter. CHADSVASc at least 5. Now on IV heparin  while holding apixaban. Heparin level = 0.35, therapeutic on 750 units/hr. hgb 9.5, pltc wnl. No bleeding noted per chart.   Goal of Therapy:  Heparin level 0.3-0.7 units/ml Monitor platelets by anticoagulation protocol: Yes   Plan:  Continue heparin to 750 units/hr Daily HL, CBC  Maryanna Shape, PharmD, BCPS  Clinical Pharmacist  Pager: [phone]   08/30/2015 2:10 PM

## 2015-08-30 NOTE — Progress Notes (Signed)
ANTICOAGULATION CONSULT NOTE  Pharmacy Consult for heparin Indication: atrial fibrillation  No Known Allergies  Patient Measurements: Height: 5\' 5"  (165.1 cm) Weight: 150 lb 11.2 oz (68.357 kg) IBW/kg (Calculated) : 57 Heparin Dosing Weight: 68.4  Vital Signs: Temp: 98.8 F (37.1 C) (05/29 0404) Temp Source: Oral (05/29 0404) BP: 143/68 mmHg (05/29 0404) Pulse Rate: 78 (05/29 0404)  Labs:  Recent Labs  08/28/15 [id number]  08/28/15 0724 08/29/15 0430 08/29/15 1553 08/30/15 0357  HGB 10.3*  --   --  10.4*  --  9.5*  HCT 31.3*  --   --  31.2*  --  28.4*  PLT 307  --   --  267  --  275  APTT  --   --  95* 181* 48*  --   HEPARINUNFRC  --   < > 1.04* 0.91* 0.23* 0.28*  CREATININE 2.00*  --   --  1.50*  --  1.59*  < > = values in this interval not displayed.  Estimated Creatinine Clearance: 22.9 mL/min (by C-G formula based on Cr of 1.59).   Medical History: Past Medical History  Diagnosis Date  . Pneumonia   . Hypertension   . Small bowel obstruction ([place])     "I don't know how many" (01/11/2015)  . Ventral hernia with bowel obstruction   . Asthma   . Hyperlipidemia   . Stroke ([place])     "light one"  . COPD (chronic obstructive pulmonary disease) ([place])   . Hypothyroidism   . Type II diabetes mellitus ([place])   . Anemia     previous blood transfusions  . GERD (gastroesophageal reflux disease)   . History of stomach ulcers   . Arthritis     "all over"  . Renal insufficiency   . Depression     "light case"  . SIADH (syndrome of inappropriate ADH production) ([place])     [name] 01/10/2015  . Perforated gastric ulcer ([place])   . Gastric stenosis   . Paraesophageal hernia   . Bradycardia     topped BB and amiodarone decreased- junctional rhythm     Medications:  Scheduled:  . amiodarone  100 mg Oral Daily  . amLODipine  10 mg Oral Daily  . atorvastatin  10 mg Oral q1800  . calcium-vitamin D  1 tablet Oral Q breakfast  . cycloSPORINE  1 drop Both Eyes BID  .  dextrose  1 Tube Oral Once  . fluticasone  2 spray Each Nare Daily  . gabapentin  300 mg Oral BID  . hydrALAZINE  50 mg Oral TID  . insulin aspart  0-5 Units Subcutaneous QHS  . insulin aspart  0-9 Units Subcutaneous TID WC  . ipratropium-albuterol  3 mL Nebulization TID  . isosorbide mononitrate  30 mg Oral Daily  . latanoprost  1 drop Both Eyes QHS  . levothyroxine  88 mcg Oral QAC breakfast  . metronidazole  500 mg Intravenous Q8H  . ondansetron  4 mg Intravenous BID  . pantoprazole (PROTONIX) IV  40 mg Intravenous Q24H  . piperacillin-tazobactam (ZOSYN)  IV  3.375 g Intravenous Q8H  . polyethylene glycol  17 g Oral BID  . polyvinyl alcohol  1 drop Both Eyes QID  . prochlorperazine  5 mg Intravenous Once  . senna-docusate  1 tablet Oral BID  . sertraline  100 mg Oral Daily  . vancomycin  750 mg Intravenous Q24H    Assessment: 82 yo woman admitted 08/26/2015 with abdominal pain on apixaban  PTA for hx AFlutter. CHADSVASc at least 5. Pharmacy consulted to dose heparin.  Last dose apixaban 5/24 at unknown time.   This morning's HL remains subtherapeutic at 0.28 on heparin 700 units/hr. Nurse reports no issues with infusion or bleeding. Pt was supratherapeutic previously on 800 units/hr so will increase conservatively.  Goal of Therapy:  Heparin level 0.3-0.7 units/ml Monitor platelets by anticoagulation protocol: Yes   Plan:  Increase heparin to 750 units/hr 8h HL Daily HL, CBC  Andrey Cota. Diona Foley, PharmD, BCPS Clinical Pharmacist Pager [phone]  08/30/2015 5:18 AM

## 2015-08-30 NOTE — Evaluation (Signed)
Physical Therapy Evaluation Patient Details Name: Shannon [name] MRN: [id number] DOB: Sep 18, 1923 Today's Date: 08/30/2015   History of Present Illness  83 yo female admitted with N/V, MRI + osteomyelitis toe of R foot. hx of HTN, Afib, MI, ventral hernia, CVA, DM, COPD, arhtritis, PVD, neuropathy.  Clinical Impression  On eval, pt required Min assist for mobility-walked ~15 feet with RW. Pt is weaker than baseline. No family present during session. Pt states she will return home with daughter. Recommend HHPT follow up.     Follow Up Recommendations Home health PT;Supervision/Assistance - 24 hour    Equipment Recommendations  None recommended by PT    Recommendations for Other Services       Precautions / Restrictions Precautions Precautions: Fall Restrictions Weight Bearing Restrictions: No      Mobility  Bed Mobility Overal bed mobility: Needs Assistance Bed Mobility: Supine to Sit     Supine to sit: Min guard     General bed mobility comments: Increased time.   Transfers Overall transfer level: Needs assistance Equipment used: Rolling walker (2 wheeled) Transfers: Sit to/from [employer] Sit to Stand: Min assist Stand pivot transfers: Min assist       General transfer comment: Assist to rise, stabilize, control descent. Stand pivot, bed to bsc, with RW.   Ambulation/Gait Ambulation/Gait assistance: Min assist Ambulation Distance (Feet): 15 Feet Assistive device: Rolling walker (2 wheeled) Gait Pattern/deviations: Step-through pattern;Decreased stride length     General Gait Details: very slow gait speed. Assist to stabilize.   Stairs            Wheelchair Mobility    Modified Rankin (Stroke Patients Only)       Balance Overall balance assessment: Needs assistance         Standing balance support: During functional activity                                 Pertinent Vitals/Pain Pain Assessment: No/denies pain     Home Living Family/patient expects to be discharged to:: Private residence Living Arrangements: Children Available Help at Discharge: Family;Available 24 hours/day Type of Home: House Home Access: Ramped entrance     Home Layout: One level Home Equipment: Walker - 4 wheels;Cane - single point;Shower seat;Hand held Careers information officer - 2 wheels      Prior Function Level of Independence: Independent with assistive device(s)         Comments: Uses rollator during the day and RW at night.       Hand Dominance        Extremity/Trunk Assessment   Upper Extremity Assessment: Generalized weakness           Lower Extremity Assessment: Generalized weakness      Cervical / Trunk Assessment: Kyphotic  Communication   Communication: No difficulties  Cognition Arousal/Alertness: Awake/alert Behavior During Therapy: WFL for tasks assessed/performed Overall Cognitive Status: Within Functional Limits for tasks assessed                      General Comments      Exercises        Assessment/Plan    PT Assessment Patient needs continued PT services  PT Diagnosis Difficulty walking;Generalized weakness   PT Problem List Decreased strength;Decreased activity tolerance;Decreased balance;Decreased mobility;Decreased knowledge of use of DME  PT Treatment Interventions DME instruction;Gait training;Functional mobility training;Therapeutic activities;Patient/family education;Balance training;Therapeutic exercise   PT Goals (Current  goals can be found in the Care Plan section) Acute Rehab PT Goals Patient Stated Goal: to go home PT Goal Formulation: With patient Time For Goal Achievement: 09/13/15 Potential to Achieve Goals: Good    Frequency Min 3X/week   Barriers to discharge        Co-evaluation               End of Session   Activity Tolerance: Patient limited by fatigue Patient left: in chair;with call bell/phone within reach;with chair alarm  set           Time: 1308-6578 PT Time Calculation (min) (ACUTE ONLY): 26 min   Charges:   PT Evaluation $PT Eval Low Complexity: 1 Procedure PT Treatments $Gait Training: 8-22 mins   PT G Codes:        Rebeca Alert, MPT Pager: [phone]

## 2015-08-30 NOTE — Progress Notes (Signed)
[facility] Progress Note  Subjective:  Called back to see patient again due to ongoing vomiting.  Patient says that she only vomited a small amount yesterday evening but none since then.  She only ate a couple of bites of food yesterday.  Feels full even when she drinks something.  Complaining of a couple of episodes of loose stool with incontinence overnight.  Objective:  Vital signs in last 24 hours: Temp:  [98.8 F (37.1 C)-99.6 F (37.6 C)] 98.8 F (37.1 C) (05/29 0404) Pulse Rate:  [78-89] 78 (05/29 0404) Resp:  [16-20] 16 (05/29 0404) BP: (143-165)/(64-69) 143/68 mmHg (05/29 0404) SpO2:  [91 %-96 %] 94 % (05/29 0404) Last BM Date: 08/29/15 General:  Alert, Well-developed, in NAD Heart:  Regular rate and rhythm Pulm:  CTAB.  No W/R/R. Abdomen:  Soft, non-distended.  BS present.  Mild upper abdominal TTP. Neurologic:  Alert and oriented x 4;  grossly normal neurologically.  Intake/Output from previous day: 05/28 0701 - 05/29 0700 In: 1575 [P.O.:440; I.V.:685; IV Piggyback:450] Out: 300 [Urine:300] Intake/Output this shift: Total I/O In: 100 [P.O.:100] Out: -   Lab Results:  Recent Labs  08/28/15 0632 08/29/15 0430 08/30/15 0357  WBC 17.9* 21.8* 22.3*  HGB 10.3* 10.4* 9.5*  HCT 31.3* 31.2* 28.4*  PLT 307 267 275   BMET  Recent Labs  08/28/15 0632 08/29/15 0430 08/30/15 0357  NA 140 141 139  K 3.6 3.3* 3.7  CL 110 109 108  CO2 20* 23 23  GLUCOSE 71 87 104*  BUN 17 11 10   CREATININE 2.00* 1.50* 1.59*  CALCIUM 8.6* 9.1 8.8*   Mr Foot Right Wo Contrast  08/29/2015  CLINICAL DATA:  Right third toe diabetic ulcer. Necrosis of muscle. Fragmentation of the tuft of the distal phalanx third toe. Query osteomyelitis. EXAM: MRI OF THE RIGHT FOREFOOT WITHOUT CONTRAST TECHNIQUE: Multiplanar, multisequence MR imaging was performed. No intravenous contrast was administered. COMPARISON:  08/27/2015 FINDINGS: Severe edema signal and poor cortical  definition in the middle and distal phalanges of the third toe compatible with acute osteomyelitis. These 2 phalanges might be fused. There is also osteomyelitis involving the distal head of the proximal phalanx of the third toe. Low-level edema in the shaft of the proximal phalanx second toe is noted but may be reactive, and is not as fulminant as I would expect for osteomyelitis. Lisfranc ligament intact. There is considerable degenerative arthropathy at the Lisfranc joint. Hallux valgus noted with bony bunion and degenerative spurring at the first MTP joint. The second digit is less flexed than the other digits, and accordingly is dorsally positioned. Abnormal subcutaneous edema noted along the dorsal and plantar forefoot. There is low-level edema tracking along the plantar musculature of the foot. Patchy degenerative edema at the calcaneocuboid articulation is seen on the coronal images. Small degenerative subcortical cystic lesion in the navicular. IMPRESSION: 1. Osteomyelitis of the phalanges of the third toe, more severely involving the middle and distal phalanges, but also with some distal involvement of the proximal phalanx. Surrounding cellulitis in the toe and also tracking in surrounding toes, the dorsum of the foot, and plantar foot. 2. Edema tracks along the plantar musculature of the foot and myositis is not excluded. 3. 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 Hallux valgus and bony bunion noted. 4. Subtle edema in the proximal phalanx of the second toe, favor reactive etiology rather than osteomyelitis of the second toe. Electronically Signed   By: Thayer Jew  Janeece Fitting M.D.   On: 08/29/2015 11:25    Assessment / Plan: *82 y.o. female acute on chronic nausea, vomiting that is multifactorial.  Complicated pt with multiple abdominal suregeries, hx SBOs, pyloric stenosis, functional GOO; candida esophagitis on latest 07/08/15 EGD. ? Related to medication, toe osteo,  etc. *Loose stool:  Recent CT scan raising suspicion of colitis.  CDiff negative.  She is on flagyl and zosyn.  ? If loose stools are antibiotic related.  She is also on Miralax twice daily and Senokot-S twice daily (she has refused these at times).  -Dr. Ardis Hughs ordered NGT this AM but it has not been placed.  I told nursing staff that Dr. Ardis Hughs really feels NG tube is needed. -She should stay on PPI once daily, zofran orally twice daily (both on scheduled basis).  -Will discontinue Senokot-S for now and decrease Miralax to once daily for now as long as she is having loose stools.   LOS: 4 days   ZEHR, JESSICA D.  08/30/2015, 9:05 AM  Pager number [phone]   ________________________________________________________________________  Velora Heckler GI MD note:  I personally examined the patient, reviewed the data and agree with the assessment and plan described above.  NG tube was placed with immediate output of 300cc and spillage of another 100.  Non-bloody gastric fluid.  She has anatomically abnormal stomach after a surgery and also snug pylorus seen last month during EGD.  ? Also poor motility due to presumed vagotomy.  She has chronic nausea, intermittent vomiting and has been relegated to puree only diet for about a year.  I suspect her acute illness, osteomyelitis +/- antibiotics to treat the bone infection are contributing to the nausea, vomiting.  Should keep NG tube in place for now.  Recent CT scan showed inflammation of colon, C. Diff negative but I elected to continue flagyl treatment given the imaging, recent antibiotics.  Currently not planning endoscopic evaluation, will follow along.  Zosyn was started yesterday, I believe for her osteo because it is not needed for GI illness and actually may contribute to her nausea.  Primary team to decide if it can be d/c'd.   Owens Loffler, MD [facility] Pager [phone]

## 2015-08-30 NOTE — Progress Notes (Signed)
[facility]                                                                              Patient Demographics  Stpehanie [name], is a 82 y.o. female, DOB - Oct 21, 1923, [id number]  Admit date - 08/26/2015   Admitting Physician Joseph Art, DO  Outpatient Primary MD for the patient is Kirt Boys, DO  Outpatient specialists:   LOS - 4  days    Chief Complaint  Patient presents with  . Abdominal Pain       Brief summary   Keyeria [name] is a 82 y.o. female with and extensive past medical and surgical history . She's had multiple intestinal surgeries as listed below. Patient known to [facility]. Patient developed nausea several days ago then yesterday began vomiting. She has minor diarrhea and lower abdominal pain a couple of days ago. She just completed antibiotics for toe ulcer. No ill contacts. CT [id number] moderately distended stomach with thickened antral region, signs of gastritis and his hepatitis, colitis with nonspecific pattern, cystitis     Assessment & Plan     Intractable Nausea and vomiting: Patient has long-standing GI and surgical history including a sigmoidectomy, remote gastro-jejunostomy and suspected vagotomy, resection of gastrojejunal anastomosis 2015 at [facility]. She has a history of gastroparesis with gastric bezoar formation, history of severe candida esophagitis, SBO requiring exp lap and LOA Oct 2016. Known to Dr. Marina Goodell with [facility].  - Last EGD for dysphagia in 4/16 showed diffuse candidiasis in the entire esophagus, gastric stenosis at the pylorus, normal duodenum  - No SBO on CT abdomen - C. difficile negative, GI recommending to complete 5 more days of Flagyl and then stop -  Continue IV PPI and scheduled Zofran - Diet was advanced yesterday, however patient now complaining of abdominal bloating, nausea and vomiting again, placed on NPO status, discussed with Dr. Amie Critchley who will follow today. Recommended NPO and NG suction.    Diabetic ulcer, right third toe with gangrene  - Patient follows wound care Center, had completed a course of doxycycline prior to admission - I had consulted orthopedics, Dr. Victorino Dike graciously saw the patient and had even scheduled OR for surgery; however patient refused the toe amputation. X-rays of the foot suggestive of osteomyelitis of the right third toe. - Follow MRI of the foot -Appreciate vascular surgery, Dr. Bosie Helper evaluation, recommended amputation of the right third toe, patient refuses to have amputation. Per vascular surgery, would not recommend any arteriogram for further evaluation. - Psychiatry was consulted, patient has capacity to make decisions. I also called patient's daughter today and discussed the MRI results, she does not want any surgery on the patient and will follow wound care, podiatry outpatient.  - will honor her wishes, change to oral antibiotics for 4 weeks at the time of discharge. Follow closely with orthopedics outpatient.   atrial fibrillation  - Rate controlled. On amiodarone and Eliquis  - Currently rate controlled, - Hold Eliquis, on IV heparin drip.  Asthma - continue duo nebs  Diabetes, type II, diet controlled - Follow hemoglobin A1c  Chronic diastolic CHF, on imdur, lasix 20mg  daily.  -  Creatinine is improving  Hypertension uncontrolled - restart Imdur, hydralazine  Acute on CKD, stage 3. Cr 1.99, baseline 1.3  - Creatinine close to baseline, continue to hold Lasix  Hypothyroidism. TSH 08/02/15 was 83.  -Change home oral Synthroid to IV, TSH 84.8 -Increase Synthroid to 88 MCG, for now on IV 44 MCG daily - Follow up TSH in 4 weeks outpatient  Constipation, on miralax and senokot, lactulose. -Dulcolax suppositories as needed, restart stool softeners  Depression. Stable.  - restart Zoloft  GERD. On PPI at home - IV PPI  Code Status: Partial code  DVT Prophylaxis: On heparin drip   Family Communication: Discussed in  detail with the patient, all imaging results, lab results explained to the patient   Disposition Plan: Once tolerating diet  Time Spent in minutes  25 minutes  Procedures:  CT abdomen  Consultants:   GI Orthopedics Vascular surgery  Antimicrobials:   IV ciprofloxacin 5/25 -526  IV Flagyl 5/25   Medications  Scheduled Meds: . amiodarone  100 mg Oral Daily  . amLODipine  10 mg Oral Daily  . atorvastatin  10 mg Oral q1800  . calcium-vitamin D  1 tablet Oral Q breakfast  . cycloSPORINE  1 drop Both Eyes BID  . dextrose  1 Tube Oral Once  . fluticasone  2 spray Each Nare Daily  . gabapentin  300 mg Oral BID  . hydrALAZINE  50 mg Oral TID  . ipratropium-albuterol  3 mL Nebulization TID  . isosorbide mononitrate  30 mg Oral Daily  . latanoprost  1 drop Both Eyes QHS  . levothyroxine  88 mcg Oral QAC breakfast  . metronidazole  500 mg Intravenous Q8H  . ondansetron  4 mg Intravenous BID  . pantoprazole (PROTONIX) IV  40 mg Intravenous Q24H  . piperacillin-tazobactam (ZOSYN)  IV  3.375 g Intravenous Q8H  . polyethylene glycol  17 g Oral Daily  . polyvinyl alcohol  1 drop Both Eyes QID  . prochlorperazine  5 mg Intravenous Once  . sertraline  100 mg Oral Daily  . simethicone  80 mg Oral QID  . vancomycin  750 mg Intravenous Q24H   Continuous Infusions: . heparin 750 Units/hr (08/30/15 0805)   PRN Meds:.acetaminophen **OR** acetaminophen, bisacodyl, hydrALAZINE, ipratropium-albuterol, lactulose, ondansetron **OR** ondansetron (ZOFRAN) IV   Antibiotics   Anti-infectives    Start     Dose/Rate Route Frequency Ordered Stop   08/30/15 1200  vancomycin (VANCOCIN) IVPB 1000 mg/200 mL premix  Status:  Discontinued     1,000 mg 200 mL/hr over 60 Minutes Intravenous Every 48 hours 08/28/15 1135 08/29/15 0911   08/29/15 1400  vancomycin (VANCOCIN) IVPB 750 mg/150 ml premix     750 mg 150 mL/hr over 60 Minutes Intravenous Every 24 hours 08/29/15 0911     08/29/15 1000   piperacillin-tazobactam (ZOSYN) IVPB 3.375 g     3.375 g 12.5 mL/hr over 240 Minutes Intravenous Every 8 hours 08/29/15 0909     08/28/15 1200  vancomycin (VANCOCIN) 1,250 mg in sodium chloride 0.9 % 250 mL IVPB     1,250 mg 166.7 mL/hr over 90 Minutes Intravenous  Once 08/28/15 1124 08/28/15 1605   08/27/15 1100  ciprofloxacin (CIPRO) IVPB 400 mg  Status:  Discontinued     400 mg 200 mL/hr over 60 Minutes Intravenous Every 24 hours 08/26/15 1648 08/27/15 1414   08/26/15 2300  ciprofloxacin (CIPRO) IVPB 400 mg  Status:  Discontinued     400 mg 200 mL/hr  over 60 Minutes Intravenous Every 12 hours 08/26/15 1646 08/26/15 1648   08/26/15 2000  metroNIDAZOLE (FLAGYL) IVPB 500 mg     500 mg 100 mL/hr over 60 Minutes Intravenous Every 8 hours 08/26/15 1646     08/26/15 1600  metroNIDAZOLE (FLAGYL) IVPB 500 mg  Status:  Discontinued     500 mg 100 mL/hr over 60 Minutes Intravenous Every 8 hours 08/26/15 1559 08/26/15 1645   08/26/15 1600  ciprofloxacin (CIPRO) IVPB 400 mg  Status:  Discontinued     400 mg 200 mL/hr over 60 Minutes Intravenous Every 12 hours 08/26/15 1559 08/26/15 1646   08/26/15 1130  ciprofloxacin (CIPRO) IVPB 400 mg     400 mg 200 mL/hr over 60 Minutes Intravenous  Once 08/26/15 1119 08/26/15 1311   08/26/15 1130  metroNIDAZOLE (FLAGYL) IVPB 500 mg     500 mg 100 mL/hr over 60 Minutes Intravenous  Once 08/26/15 1119 08/26/15 1311        Subjective:   Shannon [name] was seen and examined today. Complaining again about nausea, vomiting, feeling fall and abdominal discomfort. Refused amputation on her toe . No fevers or chills. Denies any chest pain, dyspnea, dizziness or new weakness.  Objective:   Filed Vitals:   08/29/15 1609 08/29/15 1711 08/29/15 1955 08/30/15 0404  BP: 155/69 155/64 144/68 143/68  Pulse: 86  89 78  Temp: 99.4 F (37.4 C)  99.6 F (37.6 C) 98.8 F (37.1 C)  TempSrc: Oral  Oral Oral  Resp: 18  20 16   Height:      Weight:      SpO2: 94%  91%  94%    Intake/Output Summary (Last 24 hours) at 08/30/15 1109 Last data filed at 08/30/15 1103  Gross per 24 hour  Intake   1795 ml  Output    600 ml  Net   1195 ml     Wt Readings from Last 3 Encounters:  08/26/15 68.357 kg (150 lb 11.2 oz)  08/10/15 67.495 kg (148 lb 12.8 oz)  08/06/15 66.951 kg (147 lb 9.6 oz)     Exam  General: Alert and oriented x 3, NAD  HEENT:    Neck:  Cardiovascular: S1 S2 clear  Respiratory: CTAB  Gastrointestinal: Soft, mildly tender in the epigastric region, mildly distended, + bowel sounds  Ext: no cyanosis clubbing or edema, Right third toe with gangrene  Neuro: no new deficits  Skin: Right third toe with dusky coloration   Psych: Normal affect and demeanor, alert and oriented x3    Data Reviewed:  I have personally reviewed following labs and imaging studies  Micro Results Recent Results (from the past 240 hour(s))  Urine culture     Status: None   Collection Time: 08/26/15  7:37 AM  Result Value Ref Range Status   Specimen Description URINE, CATHETERIZED  Final   Special Requests NONE  Final   Culture NO GROWTH  Final   Report Status 08/27/2015 FINAL  Final  C difficile quick scan w PCR reflex     Status: None   Collection Time: 08/28/15  6:14 AM  Result Value Ref Range Status   C Diff antigen NEGATIVE NEGATIVE Final   C Diff toxin NEGATIVE NEGATIVE Final   C Diff interpretation Negative for toxigenic C. difficile  Final    Radiology Reports Ct Abdomen Pelvis Wo Contrast  08/26/2015  CLINICAL DATA:  Left upper quadrant pain with nausea and vomiting for 1 week. EXAM: CT ABDOMEN AND PELVIS  WITHOUT CONTRAST TECHNIQUE: Multidetector CT imaging of the abdomen and pelvis was performed following the standard protocol without IV contrast. COMPARISON:  07/05/2015 FINDINGS: Lower chest and abdominal wall: Extensive ventral abdominal wall scarring with a fatty hernia left of midline, likely incisional. There is fluid within the  thick walled lower esophagus which contains refluxed or non cleared fluid. Eventration of the right diaphragm. Hepatobiliary: Multiple hepatic low densities fairly circumscribed and likely cysts.Cholecystectomy. Pancreas: Unremarkable. Spleen: Unremarkable. Adrenals/Urinary Tract: Negative adrenals. No hydronephrosis or stone. Bladder wall thickening. Very Peristranding could be related to generalized edema or cystitis. Reproductive:No pathologic findings. Stomach/Bowel: The stomach is circumferentially thick walled and moderately distended. The antral region is particularly thickened and collapsed. Suture along the lower gastric body. No small bowel dilatation. There is a proximal enteroenteric anastomosis. The proximal colon shows wall thickening and pericolonic edema, greatest along the proximal transverse segment. Colonic inflammatory changes are fairly extensive, more compatible with colitis and diverticulitis. No pneumatosis or pneumoperitoneum. Scattered colonic and ileal diverticulosis. Possible previous sigmoidectomy. Vascular/Lymphatic: No acute vascular Find.  No mass or adenopathy. Peritoneal: No ascites or pneumoperitoneum. Musculoskeletal: No acute abnormalities. Remote appearing T11 superior endplate compression fracture. Lumbar facet arthropathy with mild L5-S1 anterolisthesis. IMPRESSION: 1. Moderately distended stomach with thickened antral region. Patient has history of chronic impaired gastric emptying and recent EGD. 2. Signs of gastritis and esophagitis. 3. Colitis with nonspecific pattern. 4. Cystitis, correlate with urinalysis. Electronically Signed   By: Marnee Spring M.D.   On: 08/26/2015 09:24   Dg Chest 2 View  08/26/2015  CLINICAL DATA:  Abdominal pain, nausea and vomiting. Gastroesophageal reflux. EXAM: CHEST  2 VIEW COMPARISON:  08/02/2015 and chest CT dated 07/07/2015. FINDINGS: Borderline enlarged cardiac silhouette with an interval decrease in size. Linear density at both lung  bases. Mildly prominent pulmonary vasculature. Mild diffuse peribronchial thickening. Stable right diaphragmatic eventration. Atheromatous arterial calcifications. Upper abdominal surgical clips. Diffuse osteopenia. Bilateral shoulder degenerative changes. IMPRESSION: 1. Mildly improved left basilar linear atelectasis and possible scarring. 2. Interval mild right basilar linear atelectasis. 3. Improved cardiomegaly. 4. Mild pulmonary vascular congestion. 5. Mild bronchitic changes. Electronically Signed   By: Beckie Salts M.D.   On: 08/26/2015 07:22   Mr Foot Right Wo Contrast  08/29/2015  CLINICAL DATA:  Right third toe diabetic ulcer. Necrosis of muscle. Fragmentation of the tuft of the distal phalanx third toe. Query osteomyelitis. EXAM: MRI OF THE RIGHT FOREFOOT WITHOUT CONTRAST TECHNIQUE: Multiplanar, multisequence MR imaging was performed. No intravenous contrast was administered. COMPARISON:  08/27/2015 FINDINGS: Severe edema signal and poor cortical definition in the middle and distal phalanges of the third toe compatible with acute osteomyelitis. These 2 phalanges might be fused. There is also osteomyelitis involving the distal head of the proximal phalanx of the third toe. Low-level edema in the shaft of the proximal phalanx second toe is noted but may be reactive, and is not as fulminant as I would expect for osteomyelitis. Lisfranc ligament intact. There is considerable degenerative arthropathy at the Lisfranc joint. Hallux valgus noted with bony bunion and degenerative spurring at the first MTP joint. The second digit is less flexed than the other digits, and accordingly is dorsally positioned. Abnormal subcutaneous edema noted along the dorsal and plantar forefoot. There is low-level edema tracking along the plantar musculature of the foot. Patchy degenerative edema at the calcaneocuboid articulation is seen on the coronal images. Small degenerative subcortical cystic lesion in the navicular.  IMPRESSION: 1. Osteomyelitis of the phalanges of the third  toe, more severely involving the middle and distal phalanges, but also with some distal involvement of the proximal phalanx. Surrounding cellulitis in the toe and also tracking in surrounding toes, the dorsum of the foot, and plantar foot. 2. Edema tracks along the plantar musculature of the foot and myositis is not excluded. 3. 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 Hallux valgus and bony bunion noted. 4. Subtle edema in the proximal phalanx of the second toe, favor reactive etiology rather than osteomyelitis of the second toe. Electronically Signed   By: Gaylyn Rong M.D.   On: 08/29/2015 11:25   Dg Chest Port 1 View  08/02/2015  CLINICAL DATA:  Severe bradycardia.  Generalized weakness. EXAM: PORTABLE CHEST 1 VIEW COMPARISON:  07/08/2015 FINDINGS: Cardiomegaly with calcification in the thoracic aorta. Pulmonary vascularity is normal. Slight atelectasis at the left lung base. Right lung is clear. No acute bone abnormality. IMPRESSION: New cardiomegaly. Aortic atherosclerosis. Slight atelectasis at the left lung base. Electronically Signed   By: Francene Boyers M.D.   On: 08/02/2015 16:37   Dg Foot 2 Views Right  08/19/2015  AP and lateral xrays right foot were taken. Midfoot arthritis noted.  Hammer toe digit.  HAV deformity 1st MPJ.  There is white bone in third toe with no evidence of cystic formation.  No fracture dislocation or tumor noted.  Bone density appears normal for patients age and sex.  Joint space appears normal.  No frank deformity.   Helane Gunther DPM  Dg Foot Complete Right  08/27/2015  CLINICAL DATA:  Diabetic ulcer right foot, muscle necrosis, please evaluate bony changes at distal phalanx right third toe EXAM: RIGHT FOOT COMPLETE - 3+ VIEW COMPARISON:  08/17/2015 and 08/26/2014 FINDINGS: Three views of the right foot submitted. No acute fracture or subluxation. Again noted hallux valgus  deformity. There is diffuse osteopenia. Small plantar spur of calcaneus. Atherosclerotic vascular calcifications are noted. There is dorsal spurring tarsal metatarsal joint. Significant soft tissue swelling noted right third toe. There is osteo lysis and some bony fragmentation at the tip of distal phalanx third third toe. Osteomyelitis cannot be excluded. Further correlation with MRI is recommended. IMPRESSION: No acute fracture or subluxation. Again noted hallux valgus deformity. There is diffuse osteopenia. Small plantar spur of calcaneus. Atherosclerotic vascular calcifications are noted. There is dorsal spurring tarsal metatarsal joint. Significant soft tissue swelling noted right third toe. There is osteo lysis and some bony fragmentation at the tip of distal phalanx third third toe. Osteomyelitis cannot be excluded. Further correlation with MRI is recommended. Electronically Signed   By: Natasha Mead M.D.   On: 08/27/2015 16:34    Lab Data:  CBC:  Recent Labs Lab 08/26/15 0630 08/27/15 0153 08/28/15 2841 08/29/15 0430 08/30/15 0357  WBC 13.7* 15.7* 17.9* 21.8* 22.3*  HGB 10.8* 11.3* 10.3* 10.4* 9.5*  HCT 32.2* 34.4* 31.3* 31.2* 28.4*  MCV 93.3 94.0 94.3 95.4 94.4  PLT 335 366 307 267 275   Basic Metabolic Panel:  Recent Labs Lab 08/26/15 0630 08/27/15 0153 08/28/15 0632 08/29/15 0430 08/30/15 0357  NA 139 141 140 141 139  K 3.9 3.6 3.6 3.3* 3.7  CL 105 107 110 109 108  CO2 24 20* 20* 23 23  GLUCOSE 67 56* 71 87 104*  BUN 17 20 17 11 10   CREATININE 1.99* 2.27* 2.00* 1.50* 1.59*  CALCIUM 9.9 9.5 8.6* 9.1 8.8*   GFR: Estimated Creatinine Clearance: 22.9 mL/min (by C-G formula based on Cr of  1.59). Liver Function Tests:  Recent Labs Lab 08/26/15 0630  AST 37  ALT 27  ALKPHOS 118  BILITOT 0.5  PROT 6.6  ALBUMIN 3.4*    Recent Labs Lab 08/26/15 0630  LIPASE 18   No results for input(s): AMMONIA in the last 168 hours. Coagulation Profile: No results for  input(s): INR, PROTIME in the last 168 hours. Cardiac Enzymes: No results for input(s): CKTOTAL, CKMB, CKMBINDEX, TROPONINI in the last 168 hours. BNP (last 3 results) No results for input(s): PROBNP in the last 8760 hours. HbA1C: No results for input(s): HGBA1C in the last 72 hours. CBG:  Recent Labs Lab 08/29/15 0802 08/29/15 1205 08/29/15 1708 08/29/15 1953 08/30/15 0804  GLUCAP 93 81 74 79 84   Lipid Profile: No results for input(s): CHOL, HDL, LDLCALC, TRIG, CHOLHDL, LDLDIRECT in the last 72 hours. Thyroid Function Tests: No results for input(s): TSH, T4TOTAL, FREET4, T3FREE, THYROIDAB in the last 72 hours. Anemia Panel: No results for input(s): VITAMINB12, FOLATE, FERRITIN, TIBC, IRON, RETICCTPCT in the last 72 hours. Urine analysis:    Component Value Date/Time   COLORURINE YELLOW 08/26/2015 0737   APPEARANCEUR CLEAR 08/26/2015 0737   LABSPEC 1.022 08/26/2015 0737   PHURINE 6.5 08/26/2015 0737   GLUCOSEU NEGATIVE 08/26/2015 0737   HGBUR NEGATIVE 08/26/2015 0737   BILIRUBINUR NEGATIVE 08/26/2015 0737   KETONESUR 15* 08/26/2015 0737   PROTEINUR 100* 08/26/2015 0737   UROBILINOGEN 0.2 01/27/2015 2308   NITRITE NEGATIVE 08/26/2015 0737   LEUKOCYTESUR NEGATIVE 08/26/2015 0737     Amauris Debois M.D. [facility] 08/30/2015, 11:09 AM  Pager: [phone] Between 7am to 7pm - call Pager - [phone]  After 7pm go to [email or web address] - password [email or web address]  Call night coverage person covering after 7pm

## 2015-08-30 NOTE — Care Management Important Message (Signed)
Important Message  Patient Details  Name: Havannah Formby MRN: [id number] Date of Birth: [birth year]   Medicare Important Message Given:  Yes    Myrl Lazarus P Kashis Penley 08/30/2015, 10:03 AM

## 2015-08-31 ENCOUNTER — Encounter (HOSPITAL_COMMUNITY)
Admission: EM | Disposition: A | Discharge status: Home / Self Care | Payer: Self-pay | Source: Home / Self Care | Attending: Internal Medicine

## 2015-08-31 DIAGNOSIS — R112 Nausea with vomiting, unspecified: Secondary | ICD-10-CM

## 2015-08-31 LAB — BASIC METABOLIC PANEL
Anion gap: 11 (ref 5–15)
BUN: 11 mg/dL (ref 6–20)
CO2: 23 mmol/L (ref 22–32)
Calcium: 8.9 mg/dL (ref 8.9–10.3)
Chloride: 105 mmol/L (ref 101–111)
Creatinine, Ser: 1.66 mg/dL — ABNORMAL HIGH (ref 0.44–1.00)
GFR calc Af Amer: 30 mL/min — ABNORMAL LOW (ref >60)
GFR calc non Af Amer: 26 mL/min — ABNORMAL LOW (ref >60)
Glucose, Bld: 73 mg/dL (ref 65–99)
Potassium: 3.2 mmol/L — ABNORMAL LOW (ref 3.5–5.1)
Sodium: 139 mmol/L (ref 135–145)

## 2015-08-31 LAB — CBC
HCT: 32.9 % — ABNORMAL LOW (ref 36.0–46.0)
Hemoglobin: 11 g/dL — ABNORMAL LOW (ref 12.0–15.0)
MCH: 31.7 pg (ref 26.0–34.0)
MCHC: 33.4 g/dL (ref 30.0–36.0)
MCV: 94.8 fL (ref 78.0–100.0)
Platelets: 290 10*3/uL (ref 150–400)
RBC: 3.47 MIL/uL — ABNORMAL LOW (ref 3.87–5.11)
RDW: 18.6 % — ABNORMAL HIGH (ref 11.5–15.5)
WBC: 26.1 10*3/uL — ABNORMAL HIGH (ref 4.0–10.5)

## 2015-08-31 LAB — GLUCOSE, CAPILLARY
Glucose-Capillary: 70 mg/dL (ref 65–99)
Glucose-Capillary: 84 mg/dL (ref 65–99)
Glucose-Capillary: 84 mg/dL (ref 65–99)
Glucose-Capillary: 73 mg/dL (ref 65–99)

## 2015-08-31 LAB — HEMOGLOBIN A1C
Hgb A1c MFr Bld: 6.5 % — ABNORMAL HIGH (ref 4.8–5.6)
Mean Plasma Glucose: 140 mg/dL

## 2015-08-31 LAB — HEPARIN LEVEL (UNFRACTIONATED): Heparin Unfractionated: 0.29 IU/mL — ABNORMAL LOW (ref 0.30–0.70)

## 2015-08-31 SURGERY — AMPUTATION DIGIT
Anesthesia: General | Laterality: Right

## 2015-08-31 MED ORDER — LEVOTHYROXINE SODIUM 100 MCG IV SOLR
44.0000 ug | Freq: Every day | INTRAVENOUS | Status: DC
Start: 2015-08-31 — End: 2015-09-03
  Administered 2015-09-01 – 2015-09-03 (×3): 44 ug via INTRAVENOUS
  Filled 2015-08-31 (×3): qty 5

## 2015-08-31 MED ORDER — METOCLOPRAMIDE HCL 5 MG/ML IJ SOLN
10.0000 mg | Freq: Two times a day (BID) | INTRAMUSCULAR | Status: DC
  Administered 2015-08-31 – 2015-09-01 (×3): 10 mg via INTRAVENOUS
  Filled 2015-08-31 (×3): qty 2

## 2015-08-31 MED ORDER — DEXTROSE-NACL 5-0.45 % IV SOLN
INTRAVENOUS | Status: DC
  Administered 2015-08-31: 1000 mL via INTRAVENOUS
  Administered 2015-08-31 – 2015-09-01 (×2): via INTRAVENOUS

## 2015-08-31 MED ORDER — POTASSIUM CHLORIDE 10 MEQ/100ML IV SOLN: 10.0000 meq | INTRAVENOUS | Status: DC

## 2015-08-31 MED ORDER — POTASSIUM CHLORIDE 10 MEQ/100ML IV SOLN
10.0000 meq | INTRAVENOUS | Status: AC
  Administered 2015-08-31 (×3): 10 meq via INTRAVENOUS
  Filled 2015-08-31 (×3): qty 100

## 2015-08-31 NOTE — Progress Notes (Signed)
[facility]                                                                              Patient Demographics  Shannon [name], is a 82 y.o. female, DOB - Sep 12, 1923, IOX:[id number]  Admit date - 08/26/2015   Admitting Physician Joseph Art, DO  Outpatient Primary MD for the patient is Kirt Boys, DO  Outpatient specialists:   LOS - 5  days    Chief Complaint  Patient presents with  . Abdominal Pain       Brief summary   Deatrice Mowdy is a 82 y.o. female with and extensive past medical and surgical history . She's had multiple intestinal surgeries as listed below. Patient known to [facility]. Patient developed nausea several days ago then yesterday began vomiting. She has minor diarrhea and lower abdominal pain a couple of days ago. She just completed antibiotics for toe ulcer. No ill contacts. CT QAS:[id number] moderately distended stomach with thickened antral region, signs of gastritis and his hepatitis, colitis with nonspecific pattern, cystitis     Assessment & Plan     Intractable Nausea and vomiting: Patient has long-standing GI and surgical history including a sigmoidectomy, remote gastro-jejunostomy and suspected vagotomy, resection of gastrojejunal anastomosis 2015 at [facility]. She has a history of gastroparesis with gastric bezoar formation, history of severe candida esophagitis, SBO requiring exp lap and LOA Oct 2016. Known to Dr. Marina Goodell with [facility].  - Last EGD for dysphagia in 4/16 showed diffuse candidiasis in the entire esophagus, gastric stenosis at the pylorus, normal duodenum  - No SBO on CT abdomen - C. difficile negative, Continue IV Flagyl per GI, last dose today - Continue IV PPI, GI recommending scheduled Reglan, continue Zofran - Continue NGT suction - Potassium replacement, keep K ~4  Diabetic ulcer, right third toe with gangrene  - Patient follows wound care Center, had completed a course of doxycycline prior to admission - Patient was  seen by orthopedics, Dr. Victorino Dike and vascular surgery, Dr. early both recommended amputation of the right third toe, patient declined.Per vascular surgery, would not recommend any arteriogram for further evaluation. - X-rays of the foot suggestive of osteomyelitis of the right third toe. MRI MRI of the foot showed possible colitis of the phalanges of the third toe involving the middle and distal phalanges - Psychiatry was consulted, patient has capacity to make decisions. I also called patient's daughter, discussed the MRI results, she does not want any surgery on the patient and will follow wound care, podiatry outpatient. Will honor her wishes, change to oral antibiotics for 4 weeks at the time of discharge.  - Leukocytosis, WBCs 26.1 today, palliative care consult for goals of care  Asthma - continue duo nebs  Diabetes, type II, diet controlled - A1c 6.5, placed on D5 drip due to hypoglycemia  Chronic diastolic CHF, on imdur, lasix 20mg  daily.  - Creatinine is improving  Hypertension uncontrolled - restart Imdur, hydralazine  Acute on CKD, stage 3. Cr 1.99, baseline 1.3  - Creatinine close to baseline, continue to hold Lasix  Hypothyroidism. TSH 08/02/15 was 83.  -Change home oral Synthroid to IV,  TSH 84.8 -Increase Synthroid to 88 MCG, for now on IV 44 MCG daily - Follow up TSH in 4 weeks outpatient  Constipation, on miralax and senokot, lactulose. -Dulcolax suppositories as needed  Depression. Stable.  - NPO  GERD. On PPI at home - IV PPI  Code Status: Partial code  DVT Prophylaxis: On heparin drip   Family Communication: Discussed in detail with the patient, all imaging results, lab results explained to the patient   Disposition Plan: Once tolerating diet  Time Spent in minutes  25 minutes  Procedures:  CT abdomen  Consultants:   GI Orthopedics Vascular surgery  Antimicrobials:   IV ciprofloxacin 5/25 -526  IV Flagyl 5/25   Medications  Scheduled  Meds: . amiodarone  100 mg Oral Daily  . amLODipine  10 mg Oral Daily  . atorvastatin  10 mg Oral q1800  . calcium-vitamin D  1 tablet Oral Q breakfast  . cycloSPORINE  1 drop Both Eyes BID  . dextrose  1 Tube Oral Once  . fluticasone  2 spray Each Nare Daily  . gabapentin  300 mg Oral BID  . hydrALAZINE  50 mg Oral TID  . ipratropium-albuterol  3 mL Nebulization TID  . isosorbide mononitrate  30 mg Oral Daily  . latanoprost  1 drop Both Eyes QHS  . levothyroxine  88 mcg Oral QAC breakfast  . metoCLOPramide (REGLAN) injection  10 mg Intravenous Q12H  . metronidazole  500 mg Intravenous Q8H  . ondansetron  4 mg Intravenous BID  . pantoprazole (PROTONIX) IV  40 mg Intravenous Q24H  . polyethylene glycol  17 g Oral Daily  . polyvinyl alcohol  1 drop Both Eyes QID  . potassium chloride  10 mEq Intravenous Q1 Hr x 3  . prochlorperazine  5 mg Intravenous Once  . sertraline  100 mg Oral Daily  . simethicone  80 mg Oral QID  . vancomycin  750 mg Intravenous Q24H   Continuous Infusions: . dextrose 5 % and 0.45% NaCl 75 mL/hr at 08/31/15 0954  . heparin 800 Units/hr (08/31/15 1045)   PRN Meds:.acetaminophen **OR** acetaminophen, bisacodyl, hydrALAZINE, ipratropium-albuterol, lactulose, ondansetron **OR** ondansetron (ZOFRAN) IV   Antibiotics   Anti-infectives    Start     Dose/Rate Route Frequency Ordered Stop   08/30/15 1200  vancomycin (VANCOCIN) IVPB 1000 mg/200 mL premix  Status:  Discontinued     1,000 mg 200 mL/hr over 60 Minutes Intravenous Every 48 hours 08/28/15 1135 08/29/15 0911   08/29/15 1400  vancomycin (VANCOCIN) IVPB 750 mg/150 ml premix     750 mg 150 mL/hr over 60 Minutes Intravenous Every 24 hours 08/29/15 0911     08/29/15 1000  piperacillin-tazobactam (ZOSYN) IVPB 3.375 g  Status:  Discontinued     3.375 g 12.5 mL/hr over 240 Minutes Intravenous Every 8 hours 08/29/15 0909 08/30/15 1343   08/28/15 1200  vancomycin (VANCOCIN) 1,250 mg in sodium chloride 0.9 %  250 mL IVPB     1,250 mg 166.7 mL/hr over 90 Minutes Intravenous  Once 08/28/15 1124 08/28/15 1605   08/27/15 1100  ciprofloxacin (CIPRO) IVPB 400 mg  Status:  Discontinued     400 mg 200 mL/hr over 60 Minutes Intravenous Every 24 hours 08/26/15 1648 08/27/15 1414   08/26/15 2300  ciprofloxacin (CIPRO) IVPB 400 mg  Status:  Discontinued     400 mg 200 mL/hr over 60 Minutes Intravenous Every 12 hours 08/26/15 1646 08/26/15 1648   08/26/15 2000  metroNIDAZOLE (FLAGYL) IVPB  500 mg     500 mg 100 mL/hr over 60 Minutes Intravenous Every 8 hours 08/26/15 1646     08/26/15 1600  metroNIDAZOLE (FLAGYL) IVPB 500 mg  Status:  Discontinued     500 mg 100 mL/hr over 60 Minutes Intravenous Every 8 hours 08/26/15 1559 08/26/15 1645   08/26/15 1600  ciprofloxacin (CIPRO) IVPB 400 mg  Status:  Discontinued     400 mg 200 mL/hr over 60 Minutes Intravenous Every 12 hours 08/26/15 1559 08/26/15 1646   08/26/15 1130  ciprofloxacin (CIPRO) IVPB 400 mg     400 mg 200 mL/hr over 60 Minutes Intravenous  Once 08/26/15 1119 08/26/15 1311   08/26/15 1130  metroNIDAZOLE (FLAGYL) IVPB 500 mg     500 mg 100 mL/hr over 60 Minutes Intravenous  Once 08/26/15 1119 08/26/15 1311        Subjective:   Cecilee Mechling was seen and examined today.One small stool yesterday, complaining of epigastric discomfort. No fevers or chills. On NGT suction.  No fevers or chills. Denies any chest pain, dyspnea, dizziness or new weakness.  Objective:   Filed Vitals:   08/30/15 0404 08/30/15 1420 08/30/15 2211 08/31/15 0504  BP: 143/68 165/72 155/65 147/67  Pulse: 78 85 88 81  Temp: 98.8 F (37.1 C) 98.5 F (36.9 C) 99.6 F (37.6 C) 99.3 F (37.4 C)  TempSrc: Oral Oral  Oral  Resp: 16 18 17 18   Height:      Weight:      SpO2: 94% 100% 95% 100%    Intake/Output Summary (Last 24 hours) at 08/31/15 1059 Last data filed at 08/31/15 2956  Gross per 24 hour  Intake    550 ml  Output   1815 ml  Net  -1265 ml     Wt  Readings from Last 3 Encounters:  08/26/15 68.357 kg (150 lb 11.2 oz)  08/10/15 67.495 kg (148 lb 12.8 oz)  08/06/15 66.951 kg (147 lb 9.6 oz)     Exam  General: Alert and oriented x 3, NAD  HEENT:    Neck:  Cardiovascular: S1 S2 clear  Respiratory: CTAB  Gastrointestinal: Soft, mildly tender in the epigastric region, mildly distended, + bowel sounds  Ext: no cyanosis clubbing or edema, Right third toe with gangrene  Neuro: no new deficits  Skin: Right third toe with dusky coloration   Psych: Normal affect and demeanor, alert and oriented x3    Data Reviewed:  I have personally reviewed following labs and imaging studies  Micro Results Recent Results (from the past 240 hour(s))  Urine culture     Status: None   Collection Time: 08/26/15  7:37 AM  Result Value Ref Range Status   Specimen Description URINE, CATHETERIZED  Final   Special Requests NONE  Final   Culture NO GROWTH  Final   Report Status 08/27/2015 FINAL  Final  C difficile quick scan w PCR reflex     Status: None   Collection Time: 08/28/15  6:14 AM  Result Value Ref Range Status   C Diff antigen NEGATIVE NEGATIVE Final   C Diff toxin NEGATIVE NEGATIVE Final   C Diff interpretation Negative for toxigenic C. difficile  Final    Radiology Reports Ct Abdomen Pelvis Wo Contrast  08/26/2015  CLINICAL DATA:  Left upper quadrant pain with nausea and vomiting for 1 week. EXAM: CT ABDOMEN AND PELVIS WITHOUT CONTRAST TECHNIQUE: Multidetector CT imaging of the abdomen and pelvis was performed following the standard protocol without  IV contrast. COMPARISON:  07/05/2015 FINDINGS: Lower chest and abdominal wall: Extensive ventral abdominal wall scarring with a fatty hernia left of midline, likely incisional. There is fluid within the thick walled lower esophagus which contains refluxed or non cleared fluid. Eventration of the right diaphragm. Hepatobiliary: Multiple hepatic low densities fairly circumscribed and likely  cysts.Cholecystectomy. Pancreas: Unremarkable. Spleen: Unremarkable. Adrenals/Urinary Tract: Negative adrenals. No hydronephrosis or stone. Bladder wall thickening. Very Peristranding could be related to generalized edema or cystitis. Reproductive:No pathologic findings. Stomach/Bowel: The stomach is circumferentially thick walled and moderately distended. The antral region is particularly thickened and collapsed. Suture along the lower gastric body. No small bowel dilatation. There is a proximal enteroenteric anastomosis. The proximal colon shows wall thickening and pericolonic edema, greatest along the proximal transverse segment. Colonic inflammatory changes are fairly extensive, more compatible with colitis and diverticulitis. No pneumatosis or pneumoperitoneum. Scattered colonic and ileal diverticulosis. Possible previous sigmoidectomy. Vascular/Lymphatic: No acute vascular Find.  No mass or adenopathy. Peritoneal: No ascites or pneumoperitoneum. Musculoskeletal: No acute abnormalities. Remote appearing T11 superior endplate compression fracture. Lumbar facet arthropathy with mild L5-S1 anterolisthesis. IMPRESSION: 1. Moderately distended stomach with thickened antral region. Patient has history of chronic impaired gastric emptying and recent EGD. 2. Signs of gastritis and esophagitis. 3. Colitis with nonspecific pattern. 4. Cystitis, correlate with urinalysis. Electronically Signed   By: Marnee Spring M.D.   On: 08/26/2015 09:24   Dg Chest 2 View  08/26/2015  CLINICAL DATA:  Abdominal pain, nausea and vomiting. Gastroesophageal reflux. EXAM: CHEST  2 VIEW COMPARISON:  08/02/2015 and chest CT dated 07/07/2015. FINDINGS: Borderline enlarged cardiac silhouette with an interval decrease in size. Linear density at both lung bases. Mildly prominent pulmonary vasculature. Mild diffuse peribronchial thickening. Stable right diaphragmatic eventration. Atheromatous arterial calcifications. Upper abdominal surgical  clips. Diffuse osteopenia. Bilateral shoulder degenerative changes. IMPRESSION: 1. Mildly improved left basilar linear atelectasis and possible scarring. 2. Interval mild right basilar linear atelectasis. 3. Improved cardiomegaly. 4. Mild pulmonary vascular congestion. 5. Mild bronchitic changes. Electronically Signed   By: Beckie Salts M.D.   On: 08/26/2015 07:22   Mr Foot Right Wo Contrast  08/29/2015  CLINICAL DATA:  Right third toe diabetic ulcer. Necrosis of muscle. Fragmentation of the tuft of the distal phalanx third toe. Query osteomyelitis. EXAM: MRI OF THE RIGHT FOREFOOT WITHOUT CONTRAST TECHNIQUE: Multiplanar, multisequence MR imaging was performed. No intravenous contrast was administered. COMPARISON:  08/27/2015 FINDINGS: Severe edema signal and poor cortical definition in the middle and distal phalanges of the third toe compatible with acute osteomyelitis. These 2 phalanges might be fused. There is also osteomyelitis involving the distal head of the proximal phalanx of the third toe. Low-level edema in the shaft of the proximal phalanx second toe is noted but may be reactive, and is not as fulminant as I would expect for osteomyelitis. Lisfranc ligament intact. There is considerable degenerative arthropathy at the Lisfranc joint. Hallux valgus noted with bony bunion and degenerative spurring at the first MTP joint. The second digit is less flexed than the other digits, and accordingly is dorsally positioned. Abnormal subcutaneous edema noted along the dorsal and plantar forefoot. There is low-level edema tracking along the plantar musculature of the foot. Patchy degenerative edema at the calcaneocuboid articulation is seen on the coronal images. Small degenerative subcortical cystic lesion in the navicular. IMPRESSION: 1. Osteomyelitis of the phalanges of the third toe, more severely involving the middle and distal phalanges, but also with some distal involvement of the proximal phalanx.  Surrounding  cellulitis in the toe and also tracking in surrounding toes, the dorsum of the foot, and plantar foot. 2. Edema tracks along the plantar musculature of the foot and myositis is not excluded. 3. 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 Hallux valgus and bony bunion noted. 4. Subtle edema in the proximal phalanx of the second toe, favor reactive etiology rather than osteomyelitis of the second toe. Electronically Signed   By: Gaylyn Rong M.D.   On: 08/29/2015 11:25   Dg Chest Port 1 View  08/02/2015  CLINICAL DATA:  Severe bradycardia.  Generalized weakness. EXAM: PORTABLE CHEST 1 VIEW COMPARISON:  07/08/2015 FINDINGS: Cardiomegaly with calcification in the thoracic aorta. Pulmonary vascularity is normal. Slight atelectasis at the left lung base. Right lung is clear. No acute bone abnormality. IMPRESSION: New cardiomegaly. Aortic atherosclerosis. Slight atelectasis at the left lung base. Electronically Signed   By: Francene Boyers M.D.   On: 08/02/2015 16:37   Dg Foot 2 Views Right  08/19/2015  AP and lateral xrays right foot were taken. Midfoot arthritis noted.  Hammer toe digit.  HAV deformity 1st MPJ.  There is white bone in third toe with no evidence of cystic formation.  No fracture dislocation or tumor noted.  Bone density appears normal for patients age and sex.  Joint space appears normal.  No frank deformity.   Helane Gunther DPM  Dg Foot Complete Right  08/27/2015  CLINICAL DATA:  Diabetic ulcer right foot, muscle necrosis, please evaluate bony changes at distal phalanx right third toe EXAM: RIGHT FOOT COMPLETE - 3+ VIEW COMPARISON:  08/17/2015 and 08/26/2014 FINDINGS: Three views of the right foot submitted. No acute fracture or subluxation. Again noted hallux valgus deformity. There is diffuse osteopenia. Small plantar spur of calcaneus. Atherosclerotic vascular calcifications are noted. There is dorsal spurring tarsal metatarsal joint. Significant soft tissue  swelling noted right third toe. There is osteo lysis and some bony fragmentation at the tip of distal phalanx third third toe. Osteomyelitis cannot be excluded. Further correlation with MRI is recommended. IMPRESSION: No acute fracture or subluxation. Again noted hallux valgus deformity. There is diffuse osteopenia. Small plantar spur of calcaneus. Atherosclerotic vascular calcifications are noted. There is dorsal spurring tarsal metatarsal joint. Significant soft tissue swelling noted right third toe. There is osteo lysis and some bony fragmentation at the tip of distal phalanx third third toe. Osteomyelitis cannot be excluded. Further correlation with MRI is recommended. Electronically Signed   By: Natasha Mead M.D.   On: 08/27/2015 16:34    Lab Data:  CBC:  Recent Labs Lab 08/27/15 0153 08/28/15 4098 08/29/15 0430 08/30/15 0357 08/31/15 0543  WBC 15.7* 17.9* 21.8* 22.3* 26.1*  HGB 11.3* 10.3* 10.4* 9.5* 11.0*  HCT 34.4* 31.3* 31.2* 28.4* 32.9*  MCV 94.0 94.3 95.4 94.4 94.8  PLT 366 307 267 275 290   Basic Metabolic Panel:  Recent Labs Lab 08/27/15 0153 08/28/15 0632 08/29/15 0430 08/30/15 0357 08/31/15 0543  NA 141 140 141 139 139  K 3.6 3.6 3.3* 3.7 3.2*  CL 107 110 109 108 105  CO2 20* 20* 23 23 23   GLUCOSE 56* 71 87 104* 73  BUN 20 17 11 10 11   CREATININE 2.27* 2.00* 1.50* 1.59* 1.66*  CALCIUM 9.5 8.6* 9.1 8.8* 8.9   GFR: Estimated Creatinine Clearance: 21.9 mL/min (by C-G formula based on Cr of 1.66). Liver Function Tests:  Recent Labs Lab 08/26/15 0630  AST 37  ALT 27  ALKPHOS  118  BILITOT 0.5  PROT 6.6  ALBUMIN 3.4*    Recent Labs Lab 08/26/15 0630  LIPASE 18   No results for input(s): AMMONIA in the last 168 hours. Coagulation Profile: No results for input(s): INR, PROTIME in the last 168 hours. Cardiac Enzymes: No results for input(s): CKTOTAL, CKMB, CKMBINDEX, TROPONINI in the last 168 hours. BNP (last 3 results) No results for input(s):  PROBNP in the last 8760 hours. HbA1C:  Recent Labs  08/28/15 1204  HGBA1C 6.5*   CBG:  Recent Labs Lab 08/30/15 0804 08/30/15 1321 08/30/15 1643 08/30/15 2210 08/31/15 0801  GLUCAP 84 90 89 82 70   Lipid Profile: No results for input(s): CHOL, HDL, LDLCALC, TRIG, CHOLHDL, LDLDIRECT in the last 72 hours. Thyroid Function Tests: No results for input(s): TSH, T4TOTAL, FREET4, T3FREE, THYROIDAB in the last 72 hours. Anemia Panel: No results for input(s): VITAMINB12, FOLATE, FERRITIN, TIBC, IRON, RETICCTPCT in the last 72 hours. Urine analysis:    Component Value Date/Time   COLORURINE YELLOW 08/26/2015 0737   APPEARANCEUR CLEAR 08/26/2015 0737   LABSPEC 1.022 08/26/2015 0737   PHURINE 6.5 08/26/2015 0737   GLUCOSEU NEGATIVE 08/26/2015 0737   HGBUR NEGATIVE 08/26/2015 0737   BILIRUBINUR NEGATIVE 08/26/2015 0737   KETONESUR 15* 08/26/2015 0737   PROTEINUR 100* 08/26/2015 0737   UROBILINOGEN 0.2 01/27/2015 2308   NITRITE NEGATIVE 08/26/2015 0737   LEUKOCYTESUR NEGATIVE 08/26/2015 0737     Sahiti Joswick M.D. [facility] 08/31/2015, 10:59 AM  Pager: [phone] Between 7am to 7pm - call Pager - [phone]  After 7pm go to [email or web address] - password [email or web address]  Call night coverage person covering after 7pm

## 2015-08-31 NOTE — Progress Notes (Signed)
Daily Rounding Note  08/31/2015, 8:39 AM  LOS: 5 days   SUBJECTIVE:       1 small stool yesterday.  165 ml NGT output from 1100 yesterday - 300 today.  Some esophageal discomfort from NGT and ongoing epigastric discomfort.  No N/V  OBJECTIVE:         Vital signs in last 24 hours:    Temp:  [98.5 F (36.9 C)-99.6 F (37.6 C)] 99.3 F (37.4 C) (05/30 0504) Pulse Rate:  [81-88] 81 (05/30 0504) Resp:  [17-18] 18 (05/30 0504) BP: (147-165)/(65-72) 147/67 mmHg (05/30 0504) SpO2:  [95 %-100 %] 100 % (05/30 0504) Last BM Date: 08/30/15 Filed Weights   08/26/15 1600 08/26/15 1649  Weight: 67.5 kg (148 lb 13 oz) 68.357 kg (150 lb 11.2 oz)   General: pleasant, frail, aged, comfortable   Heart: RRR Chest: no labored breathing.   Abdomen: soft, NT, less distended and tense than last week  Extremities: no edema.  Dusky right 3rd toe.  Limbs thin.  Neuro/Psych:  Oriented x 3.  Appropriate and alert.  No gross deficits.  No tremor, rigidity, lip-smacking.   Intake/Output from previous day: 05/29 0701 - 05/30 0700 In: 510 [P.O.:310; IV Piggyback:200] Out: 1815 [Urine:650; Emesis/NG [id number]  Intake/Output this shift: Total I/O In: 60 [P.O.:60] Out: -   Lab Results:  Recent Labs  08/29/15 0430 08/30/15 0357 08/31/15 0543  WBC 21.8* 22.3* 26.1*  HGB 10.4* 9.5* 11.0*  HCT 31.2* 28.4* 32.9*  PLT 267 275 290   BMET  Recent Labs  08/29/15 0430 08/30/15 0357 08/31/15 0543  NA 141 139 139  K 3.3* 3.7 3.2*  CL 109 108 105  CO2 23 23 23   GLUCOSE 87 104* 73  BUN 11 10 11   CREATININE 1.50* 1.59* 1.66*  CALCIUM 9.1 8.8* 8.9   LFT No results for input(s): PROT, ALBUMIN, AST, ALT, ALKPHOS, BILITOT, BILIDIR, IBILI in the last 72 hours. PT/INR No results for input(s): LABPROT, INR in the last 72 hours. Hepatitis Panel No results for input(s): HEPBSAG, HCVAB, HEPAIGM, HEPBIGM in the last 72  hours.  Studies/Results: No results found.   Scheduled Meds: . amiodarone  100 mg Oral Daily  . amLODipine  10 mg Oral Daily  . atorvastatin  10 mg Oral q1800  . calcium-vitamin D  1 tablet Oral Q breakfast  . cycloSPORINE  1 drop Both Eyes BID  . dextrose  1 Tube Oral Once  . fluticasone  2 spray Each Nare Daily  . gabapentin  300 mg Oral BID  . hydrALAZINE  50 mg Oral TID  . ipratropium-albuterol  3 mL Nebulization TID  . isosorbide mononitrate  30 mg Oral Daily  . latanoprost  1 drop Both Eyes QHS  . levothyroxine  88 mcg Oral QAC breakfast  . metronidazole  500 mg Intravenous Q8H  . ondansetron  4 mg Intravenous BID  . pantoprazole (PROTONIX) IV  40 mg Intravenous Q24H  . polyethylene glycol  17 g Oral Daily  . polyvinyl alcohol  1 drop Both Eyes QID  . prochlorperazine  5 mg Intravenous Once  . sertraline  100 mg Oral Daily  . simethicone  80 mg Oral QID  . vancomycin  750 mg Intravenous Q24H   Continuous Infusions: . heparin 750 Units/hr (08/30/15 0805)   PRN Meds:.acetaminophen **OR** acetaminophen, bisacodyl, hydrALAZINE, ipratropium-albuterol, lactulose, ondansetron **OR** ondansetron (ZOFRAN) IV  ASSESMENT:   *  Acute, recurrent flare of  N/V in pt s/p multiple abd surgeries, hx SBOs, pyloric stenosis, functional GOO. Sxs persist despite BID IV PPI, BID Zofran.  NGT placed 5/29, 1.1 liters output then.  Meds could also be contributing esp the Zoloft and the Amiodarone.  *  Diarrhea, C diff negative.  Stool volume, frequency reduced.  On empiric C diff Rx with Flagyl IV .  *  Osteomyelitis, gangrene in toe.  On IV Vanc.       *  Afib.  On IV heparin in place of Eliquis.  On Amiodarone.    PLAN   *  Adding BID IV Reglan 10 mg.  Watch NGT output, if less than 1 liter in next 24 hours, consider trial clamp.     Azucena Freed  08/31/2015, 8:39 AM Pager: [phone]     Attending physician's note   I have taken an interval history, reviewed the chart and  examined the patient. I agree with the Advanced Practitioner's note, impression and recommendations. Continue NG suction until output remains low. Starting Reglan IV BID to promote more effective gastric emptying.  Lucio Edward, MD [facility] [phone] Mon-Fri 8a-5p [phone] after 5p, weekends, holidays

## 2015-08-31 NOTE — Care Management Note (Signed)
Case Management Note  Patient Details  Name: Shannon [name] MRN: [id number] Date of Birth: [birth year]  Subjective/Objective:                 Admitted with N/V, MRI + osteomyelitis toe of R foot. Hx of gastric outlet obstruction,HTN, Afib, MI, ventral hernia, CVA, DM, COPD, arhtritis, PVD, gangrenous toe neuropathy. Resides with daughter PTA. PCP: Gildardo Cranker   Action/Plan:  Palliative to address [place], pt with gastric outlet obstruction and R foot ulcer, wbc's 26.1  Return to home when medically stable. CM to f/u with disposition needs.  Expected Discharge Date:                  Expected Discharge Plan:  [place]  In-House Referral:     Discharge planning Services  CM Consult  Post Acute Care Choice:    Choice offered to:     DME Arranged:    DME Agency:     HH Arranged:    [place] Agency:     Status of Service:  In process, will continue to follow  Medicare Important Message Given:   Date Medicare IM Given:    Medicare IM give by:    Date Additional Medicare IM Given:    Additional Medicare Important Message give by:     If discussed at [place] of Stay Meetings, dates discussed:    Additional Comments: Soledad Gerlach (Daughter)  [phone]  [facility] [place], [place] [phone] 08/31/2015, 2:17 PM

## 2015-08-31 NOTE — Progress Notes (Signed)
Physical Therapy Treatment Patient Details Name: Deprise Novelo MRN: [id number] DOB: 07-16-1923 Today's Date: 08/31/2015    History of Present Illness 82 yo female admitted with N/V, MRI + osteomyelitis toe of R foot. hx of HTN, Afib, MI, ventral hernia, CVA, DM, COPD, arhtritis, PVD, neuropathy.    PT Comments    Pt feeling "bad" in bed with NG tube active.  Assisted OOB to [facility] then to recliner with increased time.  Pt was found in bed incont urine.  Assisted with hygiene and positioned in recliner to comfort.   Follow Up Recommendations  Home health PT;Supervision/Assistance - 24 hour     Equipment Recommendations  None recommended by PT    Recommendations for Other Services       Precautions / Restrictions Precautions Precautions: Fall Precaution Comments: NG tube Restrictions Weight Bearing Restrictions: No    Mobility  Bed Mobility Overal bed mobility: Needs Assistance Bed Mobility: Supine to Sit     Supine to sit: Min guard;Min assist     General bed mobility comments: Increased time.   Transfers Overall transfer level: Needs assistance Equipment used: Rolling walker (2 wheeled) Transfers: Sit to/from Stand Sit to Stand: Min guard;Min assist         General transfer comment: Assist to rise, stabilize, control descent. Stand pivot, bed to bsc, with RW.   Ambulation/Gait Ambulation/Gait assistance: Min assist Ambulation Distance (Feet): 2 Feet Assistive device: Rolling walker (2 wheeled)       General Gait Details: limited amb distance due to NG tube.  Just took a few steps from bed to [facility] then backward steps to recliner. tolerated standing for hygiene and able to functionally step support self.    Stairs            Wheelchair Mobility    Modified Rankin (Stroke Patients Only)       Balance                                    Cognition Arousal/Alertness: Awake/alert Behavior During Therapy: WFL for tasks  assessed/performed Overall Cognitive Status: Within Functional Limits for tasks assessed                      Exercises      General Comments        Pertinent Vitals/Pain Pain Assessment: No/denies pain    Home Living                      Prior Function            PT Goals (current goals can now be found in the care plan section) Progress towards PT goals: Progressing toward goals    Frequency  Min 3X/week    PT Plan Current plan remains appropriate    Co-evaluation             End of Session Equipment Utilized During Treatment: Gait belt Activity Tolerance: Patient tolerated treatment well Patient left: in chair;with call bell/phone within reach;with chair alarm set     Time: 1030-1103 PT Time Calculation (min) (ACUTE ONLY): 33 min  Charges:  $Gait Training: 8-22 mins $Therapeutic Activity: 8-22 mins                    G Codes:      Felecia Shelling  PTA [facility]  Acute  Rehab Pager  [phone]

## 2015-08-31 NOTE — Progress Notes (Signed)
[place] for heparin Indication: atrial fibrillation  No Known Allergies  Patient Measurements: Height: 5\' 5"  (165.1 cm) Weight: 150 lb 11.2 oz (68.357 kg) IBW/kg (Calculated) : 57 Heparin Dosing Weight: 68.4  Vital Signs: Temp: 99.3 F (37.4 C) (05/30 0504) Temp Source: Oral (05/30 0504) BP: 147/67 mmHg (05/30 0504) Pulse Rate: 81 (05/30 0504)  Labs:  Recent Labs  08/29/15 0430 08/29/15 1553 08/30/15 0357 08/30/15 1258 08/31/15 0543  HGB 10.4*  --  9.5*  --  11.0*  HCT 31.2*  --  28.4*  --  32.9*  PLT 267  --  275  --  290  APTT 181* 48*  --   --   --   HEPARINUNFRC 0.91* 0.23* 0.28* 0.35 0.29*  CREATININE 1.50*  --  1.59*  --  1.66*    Estimated Creatinine Clearance: 21.9 mL/min (by C-G formula based on Cr of 1.66).   Assessment: 82 yo woman admitted 08/26/2015 with abdominal pain, she is on apixaban PTA for hx AFlutter. CHADSVASc at least 5. Now on IV heparin while holding apixaban. Heparin level = 0.29, close to therapeutic range on 750 units/hr. hgb 11, pltc wnl. No bleeding noted per chart.   Goal of Therapy:  Heparin level 0.3-0.7 units/ml Monitor platelets by anticoagulation protocol: Yes   Plan:  Increase heparin rate slightly to 800 units/hr Daily HL, CBC  Maryanna Shape, PharmD, BCPS  Clinical Pharmacist  Pager: [phone]  08/31/2015 10:37 AM

## 2015-09-01 DIAGNOSIS — Z7189 Other specified counseling: Secondary | ICD-10-CM

## 2015-09-01 DIAGNOSIS — R1110 Vomiting, unspecified: Secondary | ICD-10-CM | POA: Insufficient documentation

## 2015-09-01 DIAGNOSIS — Z515 Encounter for palliative care: Secondary | ICD-10-CM

## 2015-09-01 LAB — CBC
HCT: 30 % — ABNORMAL LOW (ref 36.0–46.0)
Hemoglobin: 9.9 g/dL — ABNORMAL LOW (ref 12.0–15.0)
MCH: 31 pg (ref 26.0–34.0)
MCHC: 33 g/dL (ref 30.0–36.0)
MCV: 94 fL (ref 78.0–100.0)
Platelets: 264 10*3/uL (ref 150–400)
RBC: 3.19 MIL/uL — ABNORMAL LOW (ref 3.87–5.11)
RDW: 18.8 % — ABNORMAL HIGH (ref 11.5–15.5)
WBC: 25.5 10*3/uL — ABNORMAL HIGH (ref 4.0–10.5)

## 2015-09-01 LAB — HEPARIN LEVEL (UNFRACTIONATED): Heparin Unfractionated: 0.44 IU/mL (ref 0.30–0.70)

## 2015-09-01 LAB — BASIC METABOLIC PANEL
Anion gap: 9 (ref 5–15)
BUN: 12 mg/dL (ref 6–20)
CO2: 22 mmol/L (ref 22–32)
Calcium: 8.7 mg/dL — ABNORMAL LOW (ref 8.9–10.3)
Chloride: 106 mmol/L (ref 101–111)
Creatinine, Ser: 1.65 mg/dL — ABNORMAL HIGH (ref 0.44–1.00)
GFR calc Af Amer: 30 mL/min — ABNORMAL LOW (ref >60)
GFR calc non Af Amer: 26 mL/min — ABNORMAL LOW (ref >60)
Glucose, Bld: 92 mg/dL (ref 65–99)
Potassium: 3.5 mmol/L (ref 3.5–5.1)
Sodium: 137 mmol/L (ref 135–145)

## 2015-09-01 LAB — MAGNESIUM: Magnesium: 1.5 mg/dL — ABNORMAL LOW (ref 1.7–2.4)

## 2015-09-01 LAB — VANCOMYCIN, TROUGH: Vancomycin Tr: 50 ug/mL (ref 10.0–20.0)

## 2015-09-01 LAB — GLUCOSE, CAPILLARY
Glucose-Capillary: 121 mg/dL — ABNORMAL HIGH (ref 65–99)
Glucose-Capillary: 101 mg/dL — ABNORMAL HIGH (ref 65–99)
Glucose-Capillary: 131 mg/dL — ABNORMAL HIGH (ref 65–99)
Glucose-Capillary: 130 mg/dL — ABNORMAL HIGH (ref 65–99)

## 2015-09-01 MED ORDER — PANTOPRAZOLE SODIUM 40 MG PO PACK
40.0000 mg | PACK | Freq: Every day | ORAL | Status: DC
  Administered 2015-09-01: 40 mg
  Filled 2015-09-01: qty 20

## 2015-09-01 MED ORDER — CHLORHEXIDINE GLUCONATE 0.12 % MT SOLN
15.0000 mL | Freq: Two times a day (BID) | OROMUCOSAL | Status: DC
  Administered 2015-09-01 – 2015-09-05 (×8): 15 mL via OROMUCOSAL
  Filled 2015-09-01 (×8): qty 15

## 2015-09-01 MED ORDER — METOCLOPRAMIDE HCL 5 MG/ML IJ SOLN
10.0000 mg | Freq: Three times a day (TID) | INTRAMUSCULAR | Status: DC
  Administered 2015-09-01 – 2015-09-03 (×6): 10 mg via INTRAVENOUS
  Filled 2015-09-01 (×6): qty 2

## 2015-09-01 MED ORDER — CETYLPYRIDINIUM CHLORIDE 0.05 % MT LIQD
7.0000 mL | Freq: Two times a day (BID) | OROMUCOSAL | Status: DC
  Administered 2015-09-02 – 2015-09-05 (×7): 7 mL via OROMUCOSAL

## 2015-09-01 NOTE — Care Management Important Message (Signed)
Important Message  Patient Details  Name: Shannon [name] MRN: [id number] Date of Birth: [birth year]   Medicare Important Message Given:  Yes    [name] 09/01/2015, 10:04 AM

## 2015-09-01 NOTE — Progress Notes (Signed)
[facility] for heparin Indication: atrial fibrillation  No Known Allergies  Patient Measurements: Height: 5\' 5"  (165.1 cm) Weight: 150 lb 11.2 oz (68.357 kg) IBW/kg (Calculated) : 57 Heparin Dosing Weight: 68.4  Vital Signs: Temp: 99.2 F (37.3 C) (05/31 0457) Temp Source: Oral (05/31 0457) BP: 147/66 mmHg (05/31 1023) Pulse Rate: 86 (05/31 1023)  Labs:  Recent Labs  08/29/15 1553  08/30/15 0357 08/30/15 1258 08/31/15 0543 09/01/15 0545  HGB  --   < > 9.5*  --  11.0* 9.9*  HCT  --   --  28.4*  --  32.9* 30.0*  PLT  --   --  275  --  290 264  APTT 48*  --   --   --   --   --   HEPARINUNFRC 0.23*  --  0.28* 0.35 0.29* 0.44  CREATININE  --   --  1.59*  --  1.66* 1.65*  < > = values in this interval not displayed.  Estimated Creatinine Clearance: 22 mL/min (by C-G formula based on Cr of 1.65).   Assessment: 82 yo woman admitted 08/26/2015 with abdominal pain, she is on apixaban PTA for hx AFlutter. CHADSVASc at least 5. Now on IV heparin while holding apixaban. Heparin level = 0.44, therapeutic on 800 units/hr. hgb 9.9, pltc wnl. No bleeding noted per chart.   Goal of Therapy:  Heparin level 0.3-0.7 units/ml Monitor platelets by anticoagulation protocol: Yes   Plan:  Continue heparin 800 units/hr Daily HL, CBC  Maryanna Shape, PharmD, BCPS  Clinical Pharmacist  Pager: [phone]  09/01/2015 11:18 AM

## 2015-09-01 NOTE — Progress Notes (Signed)
Pharmacy Antibiotic Note  Shannon [name] is a 82 y.o. female admitted on 08/26/2015 with nausea/vomiting/fatigue. Incidentally found R third toe ulcer. MRI positive for osteomyelitis. Ortho saw the patient and recommended an amputation, however the patient refused. She has been on vancomycin. vanc trough was ordered for this afternoon, but vanc dose was given before level was drawn, resulting a falsely elevated level of 50. Pt's renal function has been stable, scr 1.65, est. crcl ~ 20 ml/min  5/28 zosyn >> 5/29  5/27 vanc >>  5/25 cipro >> 5/27  5/25 flagyl >> 5/31   5/25 urine - neg  5/27 C-diff antigen - neg   Plan: - Continue vancomycin 750 mg Q 24 hrs, will attempt to recheck trough tomorrow if continues.    Height: 5\' 5"  (165.1 cm) Weight: 150 lb 11.2 oz (68.357 kg) IBW/kg (Calculated) : 57  Temp (24hrs), Avg:98.8 F (37.1 C), Min:98.4 F (36.9 C), Max:99.2 F (37.3 C)   Recent Labs Lab 08/28/15 0632 08/29/15 0430 08/30/15 0357 08/31/15 0543 09/01/15 0545 09/01/15 1404  WBC 17.9* 21.8* 22.3* 26.1* 25.5*  --   CREATININE 2.00* 1.50* 1.59* 1.66* 1.65*  --   VANCOTROUGH  --   --   --   --   --  50*    Estimated Creatinine Clearance: 22 mL/min (by C-G formula based on Cr of 1.65).    No Known Allergies  Antimicrobials this admission: 5/25 cipro >> 5/27 5/25 flagyl >> 5/27 vancomycin >>  Dose adjustments this admission: NA  Microbiology results: 5/25 urine cx: negative 5/27 cdiff: negative  Thank you for allowing pharmacy to be a part of this patient's care.  Manley Mason 09/01/2015 3:42 PM

## 2015-09-01 NOTE — Progress Notes (Signed)
Nutrition Follow-up  DOCUMENTATION CODES:   Not applicable  INTERVENTION:   -RD to follow for diet advancement and supplement as appopriate  NUTRITION DIAGNOSIS:   Inadequate oral intake related to altered GI function as evidenced by NPO status.  Ongoing  GOAL:   Patient will meet greater than or equal to 90% of their needs  Unmet  MONITOR:   Diet advancement, Labs, Weight trends, Skin, I & O's  REASON FOR ASSESSMENT:   Malnutrition Screening Tool    ASSESSMENT:   Shannon [name] is a 82 y.o. female with and extensive past medical and surgical history . She's had multiple intestinal surgeries as listed below. Patient known to [facility]. Patient developed nausea several days ago then yesterday began vomiting. She has minor diarrhea and lower abdominal pain a couple of days ago. She just completed antibiotics for toe ulcer. No ill contacts.  Pt sitting in recliner chair. Noted NGT in placed, connected to low-intermittent suction.  Reviewed [facility] note from 08/27/15; pt with rt 3rd toe ulcer. Pt has declined metatarsal amputation, but remains on antibiotics. Orthopedics has signed off.   Pt with gastric outlet obstruction. NGT placed on 08/30/15. Noted 400 ml output within the past 24 hours per doc flowsheets. Pt NPO; noted intake has been very minimal since hospitalization secondary to poor po intake and periods of NPO status. If pt/family continue to desire aggressive care, may need to consider initiation of TPN.  Palliative care consult pending for goals of care.   Labs reviewed: CBGS: 121, Mg:1.5.   Diet Order:  Diet NPO time specified  Skin:  Wound (see comment) (rt toe ulcer)  Last BM:  08/30/15  Height:   Ht Readings from Last 1 Encounters:  08/26/15 5\' 5"  (1.651 m)    Weight:   Wt Readings from Last 1 Encounters:  08/26/15 150 lb 11.2 oz (68.357 kg)    Ideal Body Weight:  56.8 kg  BMI:  Body mass index is 25.08 kg/(m^2).  Estimated Nutritional Needs:    Kcal:  1500-1700  Protein:  75-85 grams  Fluid:  1.5-1.7 L  EDUCATION NEEDS:   Education needs addressed  Kaylea Mounsey A. Jimmye Norman, RD, LDN, CDE Pager: [phone] After hours Pager: [phone]

## 2015-09-01 NOTE — Progress Notes (Addendum)
[facility]                                                                              Patient Demographics  Shannon [name], is a 82 y.o. female, DOB - 25-Oct-1923, ATF:[id number]  Admit date - 08/26/2015   Admitting Physician Joseph Art, DO  Outpatient Primary MD for the patient is Kirt Boys, DO  Outpatient specialists:   LOS - 6  days    Chief Complaint  Patient presents with  . Abdominal Pain       Brief summary   Shannon [name] is a 82 y.o. female with and extensive past medical and surgical history . She's had multiple intestinal surgeries as listed below. Patient known to [facility]. Patient developed nausea several days ago then yesterday began vomiting. She has minor diarrhea and lower abdominal pain a couple of days ago. She just completed antibiotics for toe ulcer. No ill contacts. CT [facility]:WCBJSE moderately distended stomach with thickened antral region, signs of gastritis and his hepatitis, colitis with nonspecific pattern, cystitis     Assessment & Plan     Intractable Nausea and vomiting: Patient has long-standing GI and surgical history including a sigmoidectomy, remote gastro-jejunostomy and suspected vagotomy, resection of gastrojejunal anastomosis 2015 at [facility]. She has a history of gastroparesis with gastric bezoar formation, history of severe candida esophagitis, SBO requiring exp lap and LOA Oct 2016. Known to Dr. Marina Goodell with [facility].  - Last EGD for dysphagia in 4/16 showed diffuse candidiasis in the entire esophagus, gastric stenosis at the pylorus, normal duodenum  - No SBO on CT abdomen, Completed IV Flagyl, C. difficile negative - Continue IV PPI, on scheduled Reglan, continue Zofran - Continue NGT suction - Potassium replacement, keep K ~4  Diabetic ulcer, right third toe with gangrene  - Patient follows wound care Center, had completed a course of doxycycline prior to admission - Patient was seen by orthopedics, Dr. Victorino Dike and  vascular surgery, Dr. early both recommended amputation of the right third toe, patient declined.Per vascular surgery, would not recommend any arteriogram for further evaluation. - X-rays of the foot suggestive of osteomyelitis of the right third toe. MRI of the foot showed osteomyelitis of the phalanges of the third toe involving the middle and distal phalanges - Psychiatry was consulted, patient has capacity to make decisions. I also called patient's daughter, discussed the MRI results, she does not want any surgery on the patient and will follow wound care, podiatry outpatient. Will honor her wishes, change to oral antibiotics for 4 weeks at the time of discharge.  - Leukocytosis, WBCs 25 today, palliative care consult for goals of care  Asthma - continue duo nebs  Diabetes, type II, diet controlled - A1c 6.5, placed on D5 drip due to hypoglycemia  Chronic diastolic CHF, on imdur, lasix 20mg  daily.  - Creatinine is improving  Hypertension uncontrolled - restart Imdur, hydralazine  Acute on CKD, stage 3. Cr 1.99, baseline 1.3  - Creatinine close to baseline, continue to hold Lasix  Hypothyroidism. TSH 08/02/15 was 83.  -Change home oral Synthroid to IV, TSH 84.8 -Increase Synthroid to 88 MCG, for now  on IV 44 MCG daily - Follow up TSH in 4 weeks outpatient  Constipation, on miralax and senokot, lactulose. -Dulcolax suppositories as needed  Depression. Stable.  - NPO  GERD. On PPI at home - IV PPI  Code Status: Partial code  DVT Prophylaxis: On heparin drip   Family Communication: Discussed in detail with the patient, all imaging results, lab results explained to the patient   Disposition Plan: Once tolerating diet  Time Spent in minutes  15 minutes  Procedures:  CT abdomen  Consultants:   GI Orthopedics Vascular surgery  Antimicrobials:   IV ciprofloxacin 5/25 -526  IV Flagyl 5/25   Medications  Scheduled Meds: . amiodarone  100 mg Oral Daily  .  amLODipine  10 mg Oral Daily  . atorvastatin  10 mg Oral q1800  . calcium-vitamin D  1 tablet Oral Q breakfast  . cycloSPORINE  1 drop Both Eyes BID  . dextrose  1 Tube Oral Once  . fluticasone  2 spray Each Nare Daily  . gabapentin  300 mg Oral BID  . hydrALAZINE  50 mg Oral TID  . isosorbide mononitrate  30 mg Oral Daily  . latanoprost  1 drop Both Eyes QHS  . levothyroxine  44 mcg Intravenous Daily  . metoCLOPramide (REGLAN) injection  10 mg Intravenous Q12H  . ondansetron  4 mg Intravenous BID  . pantoprazole sodium  40 mg Per Tube QHS  . polyethylene glycol  17 g Oral Daily  . polyvinyl alcohol  1 drop Both Eyes QID  . prochlorperazine  5 mg Intravenous Once  . sertraline  100 mg Oral Daily  . simethicone  80 mg Oral QID  . vancomycin  750 mg Intravenous Q24H   Continuous Infusions: . dextrose 5 % and 0.45% NaCl 75 mL/hr at 09/01/15 0555  . heparin 800 Units/hr (08/31/15 1045)   PRN Meds:.acetaminophen **OR** acetaminophen, bisacodyl, hydrALAZINE, ipratropium-albuterol, lactulose, ondansetron **OR** ondansetron (ZOFRAN) IV   Antibiotics   Anti-infectives    Start     Dose/Rate Route Frequency Ordered Stop   08/30/15 1200  vancomycin (VANCOCIN) IVPB 1000 mg/200 mL premix  Status:  Discontinued     1,000 mg 200 mL/hr over 60 Minutes Intravenous Every 48 hours 08/28/15 1135 08/29/15 0911   08/29/15 1400  vancomycin (VANCOCIN) IVPB 750 mg/150 ml premix     750 mg 150 mL/hr over 60 Minutes Intravenous Every 24 hours 08/29/15 0911     08/29/15 1000  piperacillin-tazobactam (ZOSYN) IVPB 3.375 g  Status:  Discontinued     3.375 g 12.5 mL/hr over 240 Minutes Intravenous Every 8 hours 08/29/15 0909 08/30/15 1343   08/28/15 1200  vancomycin (VANCOCIN) 1,250 mg in sodium chloride 0.9 % 250 mL IVPB     1,250 mg 166.7 mL/hr over 90 Minutes Intravenous  Once 08/28/15 1124 08/28/15 1605   08/27/15 1100  ciprofloxacin (CIPRO) IVPB 400 mg  Status:  Discontinued     400 mg 200 mL/hr  over 60 Minutes Intravenous Every 24 hours 08/26/15 1648 08/27/15 1414   08/26/15 2300  ciprofloxacin (CIPRO) IVPB 400 mg  Status:  Discontinued     400 mg 200 mL/hr over 60 Minutes Intravenous Every 12 hours 08/26/15 1646 08/26/15 1648   08/26/15 2000  metroNIDAZOLE (FLAGYL) IVPB 500 mg  Status:  Discontinued     500 mg 100 mL/hr over 60 Minutes Intravenous Every 8 hours 08/26/15 1646 09/01/15 1144   08/26/15 1600  metroNIDAZOLE (FLAGYL) IVPB 500 mg  Status:  Discontinued     500 mg 100 mL/hr over 60 Minutes Intravenous Every 8 hours 08/26/15 1559 08/26/15 1645   08/26/15 1600  ciprofloxacin (CIPRO) IVPB 400 mg  Status:  Discontinued     400 mg 200 mL/hr over 60 Minutes Intravenous Every 12 hours 08/26/15 1559 08/26/15 1646   08/26/15 1130  ciprofloxacin (CIPRO) IVPB 400 mg     400 mg 200 mL/hr over 60 Minutes Intravenous  Once 08/26/15 1119 08/26/15 1311   08/26/15 1130  metroNIDAZOLE (FLAGYL) IVPB 500 mg     500 mg 100 mL/hr over 60 Minutes Intravenous  Once 08/26/15 1119 08/26/15 1311        Subjective:   Sincerity Cathcart was seen and examined today.Overall feeling better today, still on NGT suctioning. No significant abdominal pain today. No fevers or chills.  No fevers or chills. Denies any chest pain, dyspnea, dizziness or new weakness.  Objective:   Filed Vitals:   08/31/15 2146 09/01/15 0457 09/01/15 0812 09/01/15 1023  BP: 142/59 156/66  147/66  Pulse: 92 86  86  Temp: 98.9 F (37.2 C) 99.2 F (37.3 C)    TempSrc: Oral Oral    Resp: 18 18    Height:      Weight:      SpO2: 94% 92% 95%     Intake/Output Summary (Last 24 hours) at 09/01/15 1328 Last data filed at 09/01/15 1128  Gross per 24 hour  Intake 2433.77 ml  Output    750 ml  Net 1683.77 ml     Wt Readings from Last 3 Encounters:  08/26/15 68.357 kg (150 lb 11.2 oz)  08/10/15 67.495 kg (148 lb 12.8 oz)  08/06/15 66.951 kg (147 lb 9.6 oz)     Exam  General: Alert and oriented x 3, NAD,  NGT  HEENT:    Neck:  Cardiovascular: S1 S2 clear  Respiratory: CTAB  Gastrointestinal: Soft, NT, ND, NBS  Ext: no cyanosis clubbing or edema, Right third toe with gangrene  Neuro: no new deficits  Skin: Right third toe with dusky coloration   Psych: Normal affect and demeanor, alert and oriented x3    Data Reviewed:  I have personally reviewed following labs and imaging studies  Micro Results Recent Results (from the past 240 hour(s))  Urine culture     Status: None   Collection Time: 08/26/15  7:37 AM  Result Value Ref Range Status   Specimen Description URINE, CATHETERIZED  Final   Special Requests NONE  Final   Culture NO GROWTH  Final   Report Status 08/27/2015 FINAL  Final  C difficile quick scan w PCR reflex     Status: None   Collection Time: 08/28/15  6:14 AM  Result Value Ref Range Status   C Diff antigen NEGATIVE NEGATIVE Final   C Diff toxin NEGATIVE NEGATIVE Final   C Diff interpretation Negative for toxigenic C. difficile  Final    Radiology Reports Ct Abdomen Pelvis Wo Contrast  08/26/2015  CLINICAL DATA:  Left upper quadrant pain with nausea and vomiting for 1 week. EXAM: CT ABDOMEN AND PELVIS WITHOUT CONTRAST TECHNIQUE: Multidetector CT imaging of the abdomen and pelvis was performed following the standard protocol without IV contrast. COMPARISON:  07/05/2015 FINDINGS: Lower chest and abdominal wall: Extensive ventral abdominal wall scarring with a fatty hernia left of midline, likely incisional. There is fluid within the thick walled lower esophagus which contains refluxed or non cleared fluid. Eventration of the right diaphragm. Hepatobiliary: Multiple  hepatic low densities fairly circumscribed and likely cysts.Cholecystectomy. Pancreas: Unremarkable. Spleen: Unremarkable. Adrenals/Urinary Tract: Negative adrenals. No hydronephrosis or stone. Bladder wall thickening. Very Peristranding could be related to generalized edema or cystitis. Reproductive:No  pathologic findings. Stomach/Bowel: The stomach is circumferentially thick walled and moderately distended. The antral region is particularly thickened and collapsed. Suture along the lower gastric body. No small bowel dilatation. There is a proximal enteroenteric anastomosis. The proximal colon shows wall thickening and pericolonic edema, greatest along the proximal transverse segment. Colonic inflammatory changes are fairly extensive, more compatible with colitis and diverticulitis. No pneumatosis or pneumoperitoneum. Scattered colonic and ileal diverticulosis. Possible previous sigmoidectomy. Vascular/Lymphatic: No acute vascular Find.  No mass or adenopathy. Peritoneal: No ascites or pneumoperitoneum. Musculoskeletal: No acute abnormalities. Remote appearing T11 superior endplate compression fracture. Lumbar facet arthropathy with mild L5-S1 anterolisthesis. IMPRESSION: 1. Moderately distended stomach with thickened antral region. Patient has history of chronic impaired gastric emptying and recent EGD. 2. Signs of gastritis and esophagitis. 3. Colitis with nonspecific pattern. 4. Cystitis, correlate with urinalysis. Electronically Signed   By: Marnee Spring M.D.   On: 08/26/2015 09:24   Dg Chest 2 View  08/26/2015  CLINICAL DATA:  Abdominal pain, nausea and vomiting. Gastroesophageal reflux. EXAM: CHEST  2 VIEW COMPARISON:  08/02/2015 and chest CT dated 07/07/2015. FINDINGS: Borderline enlarged cardiac silhouette with an interval decrease in size. Linear density at both lung bases. Mildly prominent pulmonary vasculature. Mild diffuse peribronchial thickening. Stable right diaphragmatic eventration. Atheromatous arterial calcifications. Upper abdominal surgical clips. Diffuse osteopenia. Bilateral shoulder degenerative changes. IMPRESSION: 1. Mildly improved left basilar linear atelectasis and possible scarring. 2. Interval mild right basilar linear atelectasis. 3. Improved cardiomegaly. 4. Mild pulmonary  vascular congestion. 5. Mild bronchitic changes. Electronically Signed   By: Beckie Salts M.D.   On: 08/26/2015 07:22   Mr Foot Right Wo Contrast  08/29/2015  CLINICAL DATA:  Right third toe diabetic ulcer. Necrosis of muscle. Fragmentation of the tuft of the distal phalanx third toe. Query osteomyelitis. EXAM: MRI OF THE RIGHT FOREFOOT WITHOUT CONTRAST TECHNIQUE: Multiplanar, multisequence MR imaging was performed. No intravenous contrast was administered. COMPARISON:  08/27/2015 FINDINGS: Severe edema signal and poor cortical definition in the middle and distal phalanges of the third toe compatible with acute osteomyelitis. These 2 phalanges might be fused. There is also osteomyelitis involving the distal head of the proximal phalanx of the third toe. Low-level edema in the shaft of the proximal phalanx second toe is noted but may be reactive, and is not as fulminant as I would expect for osteomyelitis. Lisfranc ligament intact. There is considerable degenerative arthropathy at the Lisfranc joint. Hallux valgus noted with bony bunion and degenerative spurring at the first MTP joint. The second digit is less flexed than the other digits, and accordingly is dorsally positioned. Abnormal subcutaneous edema noted along the dorsal and plantar forefoot. There is low-level edema tracking along the plantar musculature of the foot. Patchy degenerative edema at the calcaneocuboid articulation is seen on the coronal images. Small degenerative subcortical cystic lesion in the navicular. IMPRESSION: 1. Osteomyelitis of the phalanges of the third toe, more severely involving the middle and distal phalanges, but also with some distal involvement of the proximal phalanx. Surrounding cellulitis in the toe and also tracking in surrounding toes, the dorsum of the foot, and plantar foot. 2. Edema tracks along the plantar musculature of the foot and myositis is not excluded. 3. Degenerative arthropathy of the Lisfranc joint and  first MTP joint. Hallux valgus  and bony bunion noted. 4. Subtle edema in the proximal phalanx of the second toe, favor reactive etiology rather than osteomyelitis of the second toe. Electronically Signed   By: Gaylyn Rong M.D.   On: 08/29/2015 11:25   Dg Chest Port 1 View  08/02/2015  CLINICAL DATA:  Severe bradycardia.  Generalized weakness. EXAM: PORTABLE CHEST 1 VIEW COMPARISON:  07/08/2015 FINDINGS: Cardiomegaly with calcification in the thoracic aorta. Pulmonary vascularity is normal. Slight atelectasis at the left lung base. Right lung is clear. No acute bone abnormality. IMPRESSION: New cardiomegaly. Aortic atherosclerosis. Slight atelectasis at the left lung base. Electronically Signed   By: Francene Boyers M.D.   On: 08/02/2015 16:37   Dg Foot 2 Views Right  08/19/2015  AP and lateral xrays right foot were taken. Midfoot arthritis noted.  Hammer toe digit.  HAV deformity 1st MPJ.  There is white bone in third toe with no evidence of cystic formation.  No fracture dislocation or tumor noted.  Bone density appears normal for patients age and sex.  Joint space appears normal.  No frank deformity.   Helane Gunther DPM  Dg Foot Complete Right  08/27/2015  CLINICAL DATA:  Diabetic ulcer right foot, muscle necrosis, please evaluate bony changes at distal phalanx right third toe EXAM: RIGHT FOOT COMPLETE - 3+ VIEW COMPARISON:  08/17/2015 and 08/26/2014 FINDINGS: Three views of the right foot submitted. No acute fracture or subluxation. Again noted hallux valgus deformity. There is diffuse osteopenia. Small plantar spur of calcaneus. Atherosclerotic vascular calcifications are noted. There is dorsal spurring tarsal metatarsal joint. Significant soft tissue swelling noted right third toe. There is osteo lysis and some bony fragmentation at the tip of distal phalanx third third toe. Osteomyelitis cannot be excluded. Further correlation with MRI is recommended. IMPRESSION: No acute fracture or subluxation.  Again noted hallux valgus deformity. There is diffuse osteopenia. Small plantar spur of calcaneus. Atherosclerotic vascular calcifications are noted. There is dorsal spurring tarsal metatarsal joint. Significant soft tissue swelling noted right third toe. There is osteo lysis and some bony fragmentation at the tip of distal phalanx third third toe. Osteomyelitis cannot be excluded. Further correlation with MRI is recommended. Electronically Signed   By: Natasha Mead M.D.   On: 08/27/2015 16:34    Lab Data:  CBC:  Recent Labs Lab 08/28/15 1610 08/29/15 0430 08/30/15 0357 08/31/15 0543 09/01/15 0545  WBC 17.9* 21.8* 22.3* 26.1* 25.5*  HGB 10.3* 10.4* 9.5* 11.0* 9.9*  HCT 31.3* 31.2* 28.4* 32.9* 30.0*  MCV 94.3 95.4 94.4 94.8 94.0  PLT 307 267 275 290 264   Basic Metabolic Panel:  Recent Labs Lab 08/28/15 0632 08/29/15 0430 08/30/15 0357 08/31/15 0543 09/01/15 0545  NA 140 141 139 139 137  K 3.6 3.3* 3.7 3.2* 3.5  CL 110 109 108 105 106  CO2 20* 23 23 23 22   GLUCOSE 71 87 104* 73 92  BUN 17 11 10 11 12   CREATININE 2.00* 1.50* 1.59* 1.66* 1.65*  CALCIUM 8.6* 9.1 8.8* 8.9 8.7*  MG  --   --   --   --  1.5*   GFR: Estimated Creatinine Clearance: 22 mL/min (by C-G formula based on Cr of 1.65). Liver Function Tests:  Recent Labs Lab 08/26/15 0630  AST 37  ALT 27  ALKPHOS 118  BILITOT 0.5  PROT 6.6  ALBUMIN 3.4*    Recent Labs Lab 08/26/15 0630  LIPASE 18   No results for input(s): AMMONIA in the last 168 hours.  Coagulation Profile: No results for input(s): INR, PROTIME in the last 168 hours. Cardiac Enzymes: No results for input(s): CKTOTAL, CKMB, CKMBINDEX, TROPONINI in the last 168 hours. BNP (last 3 results) No results for input(s): PROBNP in the last 8760 hours. HbA1C: No results for input(s): HGBA1C in the last 72 hours. CBG:  Recent Labs Lab 08/31/15 1224 08/31/15 1620 08/31/15 2144 09/01/15 0755 09/01/15 1144  GLUCAP 84 84 73 121* 101*    Lipid Profile: No results for input(s): CHOL, HDL, LDLCALC, TRIG, CHOLHDL, LDLDIRECT in the last 72 hours. Thyroid Function Tests: No results for input(s): TSH, T4TOTAL, FREET4, T3FREE, THYROIDAB in the last 72 hours. Anemia Panel: No results for input(s): VITAMINB12, FOLATE, FERRITIN, TIBC, IRON, RETICCTPCT in the last 72 hours. Urine analysis:    Component Value Date/Time   COLORURINE YELLOW 08/26/2015 0737   APPEARANCEUR CLEAR 08/26/2015 0737   LABSPEC 1.022 08/26/2015 0737   PHURINE 6.5 08/26/2015 0737   GLUCOSEU NEGATIVE 08/26/2015 0737   HGBUR NEGATIVE 08/26/2015 0737   BILIRUBINUR NEGATIVE 08/26/2015 0737   KETONESUR 15* 08/26/2015 0737   PROTEINUR 100* 08/26/2015 0737   UROBILINOGEN 0.2 01/27/2015 2308   NITRITE NEGATIVE 08/26/2015 0737   LEUKOCYTESUR NEGATIVE 08/26/2015 0737     Sanyla Summey M.D. [facility] 09/01/2015, 1:28 PM  Pager: [phone] Between 7am to 7pm - call Pager - [phone]  After 7pm go to [email or web address] - password [email or web address]  Call night coverage person covering after 7pm

## 2015-09-01 NOTE — Consult Note (Signed)
Consultation Note Date: 09/01/2015   Patient Name: Shannon [name]  DOB: [birth year]  MRN: [id number]  Age / Sex: 82 y.o., female  PCP: Kirt Boys, DO Referring Physician: Cathren Harsh, MD  Reason for Consultation: Establishing goals of care  HPI/Patient Profile: 82 y.o. female  with past medical history of perforated gastric ulcer, multiple gastric surgeries, DM2, COPD, CKD 3, NSTEMI, hyperthyroidism, and multiple SBOs.  She was admitted on 08/26/2015 with nausea and vomiting.  The patient has no evidence for bowel obstruction on CT.  [facility] was consulted and has been follow the patient.  She has been very slow to improve.  Her symptoms of vomiting resolved with N/G placement, reglan, flagyl and conservative therapy - but she still has no bowel sounds and is not passing flatus.  Further, the patient has a gangrenous right 3rd toe for which she has refused amputation.  She is requested only continued antibiotic therapy.  Psychiatry evaluation indicated she has capacity to make her own decisions.  Clinical Assessment and Goals of Care: I met with the patient, her daughter and grandson at bedside.  During her last hospital stay in April (admitted for bowel obstruction) we reviewed her Living Will.  Her health care power of attorney is her daughter, Shannon [name].  Her living will indicates that she would not want heroic measures in the event of a natural death.  Shannon [name] reinforces her desire for a more palliative approach.  She refuses surgery for her foot.  We spent most of the meeting discussing her health status.  We hypothesized about the cause of her recurrent functional bowel obstruction (possibly a perfect storm of gastroparesis, pyloric stenosis, thyroid hormone fluctuations and age).  The patient eats only pureed food.  She is active - walking regularly and going out to the grocery store on  her own!  If she has stomach pain she stops eating until she can manage clears.  She avoids all leafy greens and raw vegetables. I reassured that family their are managing her stomach issues in the best way possible.    We also discussed her gangrenous toe.  I showed Shannon [name] the MRI and explained that our fear is the infection is currently in the bone but may have the ability to quickly spread to her entire body.  Shannon [name] commented that if God decides it is her time she will go.  She does not want amputation.  She wants to leave this world with all of the limbs God gave her still attached.  She only wants antibiotic therapy.  I asked Shannon [name] to talk with me about what she would want if her bowel obstruction did not clear.  I explained that a normal person would die in about 2 weeks without food/water.  She stated she would NOT want artificial nutrition and specifically she does not want a feeding tube.  I began to describe Hospice, but Shannon [name] said she preferred not to discuss Hospice.  Her husband died of cancer and unfortunately Hospice was  called late in the game - so when the arrived he died quickly.   I told her I would respect her wishes not to discuss Hospice.     I plan to follow up with Shannon [name] and the patient to continue to refine their goals again tomorrow.   I am fearful that either her osteomyelitis will cause an overwhelming systemic infection - and/ or her functional bowel obstruction may not resolve or it may recur quickly.  NEXT OF KIN/HCPOA/Daughter:  Shannon [name].    SUMMARY OF RECOMMENDATIONS   1.  Patient thus far requests antibiotics and no surgery for her gangrenous toe. 2.  She does not want CPR, Intubation, or Artificial feeding. 3.  She does want to continue to return to the hospital as needed for uncontrollable bowel obstruction symptoms. 4.  She lives at home with her daughter and at this point will not consider Hospice. 5.  I will follow up tomorrow to continue  to inform the family and attempt again to discuss Hospice. 6.  Patient complains that she is barely urinating.  I will request a bladder scan and close tracking of her urine output.  Code Status/Advance Care Planning:  Limited code  Symptom Management:   Per [facility] attending and GI  Psycho-social/Spiritual:   Desire for further Chaplaincy support:yes, they are [facility] and appreciated the chaplain visits previously.  Additional Recommendations: Caregiving  Support/Resources  Prognosis:   < 6 months.  Given high risk for systemic spread of infection from existing osteomyelitis in the setting of DM as well as the potential for a bowel obstruction that will not clear in this frail 82 yo female with COPD and Afib.  Discharge Planning: Home with Home Health      Primary Diagnoses: Present on Admission:  . Colitis . Acute renal failure superimposed on stage 3 chronic kidney disease (HCC) . DM (diabetes mellitus) type II uncontrolled, periph vascular disorder (HCC) . Essential hypertension, benign . Gastric bezoar . Adjustment disorder with anxiety  I have reviewed the medical record, interviewed the patient and family, and examined the patient. The following aspects are pertinent.  Past Medical History  Diagnosis Date  . Pneumonia   . Hypertension   . Small bowel obstruction (HCC)     "I don't know how many" (01/11/2015)  . Ventral hernia with bowel obstruction   . Asthma   . Hyperlipidemia   . Stroke (HCC)     "light one"  . COPD (chronic obstructive pulmonary disease) (HCC)   . Hypothyroidism   . Type II diabetes mellitus (HCC)   . Anemia     previous blood transfusions  . GERD (gastroesophageal reflux disease)   . History of stomach ulcers   . Arthritis     "all over"  . Renal insufficiency   . Depression     "light case"  . SIADH (syndrome of inappropriate ADH production) (HCC)     [name] 01/10/2015  . Perforated gastric ulcer (HCC)   . Gastric stenosis   .  Paraesophageal hernia   . Bradycardia     topped BB and amiodarone decreased- junctional rhythm    Social History   Social History  . Marital Status: Widowed    Spouse Name: N/A  . Number of Children: 3  . Years of Education: N/A   Social History Main Topics  . Smoking status: Never Smoker   . Smokeless tobacco: Former Neurosurgeon    Types: Snuff  . Alcohol Use: No  . Drug Use:  No  . Sexual Activity: No   Other Topics Concern  . None   Social History Narrative   Married in [place], lives in one story house with 2 people and no pets   Occupation: ?   Has a living Will, [place], and doesn't have DNR   Was living with Husband (in frail health) in [place] until 09/2013.  After her [facility] admission they both came to live with daughter in [place]   Family History  Problem Relation Age of Onset  . Stroke Mother   . Heart attack Neg Hx   . Hypertension Mother   . Diabetes Brother    Scheduled Meds: . amiodarone  100 mg Oral Daily  . amLODipine  10 mg Oral Daily  . atorvastatin  10 mg Oral q1800  . calcium-vitamin D  1 tablet Oral Q breakfast  . cycloSPORINE  1 drop Both Eyes BID  . dextrose  1 Tube Oral Once  . fluticasone  2 spray Each Nare Daily  . gabapentin  300 mg Oral BID  . hydrALAZINE  50 mg Oral TID  . isosorbide mononitrate  30 mg Oral Daily  . latanoprost  1 drop Both Eyes QHS  . levothyroxine  44 mcg Intravenous Daily  . metoCLOPramide (REGLAN) injection  10 mg Intravenous TID  . ondansetron  4 mg Intravenous BID  . pantoprazole sodium  40 mg Per Tube QHS  . polyethylene glycol  17 g Oral Daily  . polyvinyl alcohol  1 drop Both Eyes QID  . prochlorperazine  5 mg Intravenous Once  . sertraline  100 mg Oral Daily  . simethicone  80 mg Oral QID  . vancomycin  750 mg Intravenous Q24H   Continuous Infusions: . dextrose 5 % and 0.45% NaCl 75 mL/hr at 09/01/15 0555  . heparin 800 Units/hr (08/31/15 1045)   PRN Meds:.acetaminophen **OR** acetaminophen, bisacodyl,  hydrALAZINE, ipratropium-albuterol, lactulose, ondansetron **OR** ondansetron (ZOFRAN) IV Medications Prior to Admission:  Prior to Admission medications   Medication Sig Start Date End Date Taking? Authorizing Provider  acetaminophen (TYLENOL) 325 MG tablet Take 325 mg by mouth every 6 (six) hours as needed for mild pain or fever.    Yes Historical Provider, MD  ADVAIR DISKUS 250-50 MCG/DOSE AEPB INHALE 1 PUFF INTO THE LUNGS 2 (TWO) TIMES DAILY AS NEEDED FOR SHORTNESS OF BREATH 12/01/14  Yes Historical Provider, MD  albuterol (PROVENTIL HFA;VENTOLIN HFA) 108 (90 Base) MCG/ACT inhaler Inhale 2 puffs into the lungs every 6 (six) hours as needed for wheezing or shortness of breath. 06/04/15  Yes Kirt Boys, DO  amiodarone (PACERONE) 100 MG tablet Take 1 tablet (100 mg total) by mouth daily. 08/05/15  Yes Alexa Lucrezia Starch, MD  amLODipine (NORVASC) 10 MG tablet TAKE 1 TABLET BY MOUTH DAILY FOR HYPERTENSION 08/09/15  Yes Kirt Boys, DO  apixaban (ELIQUIS) 2.5 MG TABS tablet Take 1 tablet (2.5 mg total) by mouth 2 (two) times daily. 08/11/15  Yes Kirt Boys, DO  b complex vitamins tablet Take 1 tablet by mouth daily.    Yes Historical Provider, MD  calcium-vitamin D (OSCAL WITH D) 500-200 MG-UNIT per tablet Take 1 tablet by mouth daily with breakfast.    Yes Historical Provider, MD  chlorhexidine (PERIDEX) 0.12 % solution Use as directed 15 mLs in the mouth or throat 2 (two) times daily.   Yes Historical Provider, MD  [employer] GAS RELIEF 80 MG chewable tablet CHEW 1 TABLET BY MOUTH 4 TIMES A DAY AS NEEDED FOR  FLATULENCE 06/28/15  Yes Kirt Boys, DO  diclofenac sodium (VOLTAREN) 1 % GEL Apply 2 g topically daily as needed (for pain).    Yes Historical Provider, MD  doxycycline (VIBRA-TABS) 100 MG tablet Take 1 tablet (100 mg total) by mouth 2 (two) times daily. 08/17/15  Yes Helane Gunther, DPM  feeding supplement (BOOST / RESOURCE BREEZE) LIQD Take 1 Container by mouth 3 (three) times daily between meals.  01/27/15  Yes Shanker Levora Dredge, MD  fluticasone (FLONASE) 50 MCG/ACT nasal spray Place 2 sprays into both nostrils daily. 08/07/14  Yes Kirt Boys, DO  furosemide (LASIX) 20 MG tablet Take 1 tablet (20 mg total) by mouth daily. 07/23/15  Yes Azalee Course, PA  gabapentin (NEURONTIN) 300 MG capsule Take 2 caps po in AM and qhs, take 1 cap daily at [facility] 08/06/15  Yes Kirt Boys, DO  hydrALAZINE (APRESOLINE) 50 MG tablet Take 1 tablet (50 mg total) by mouth 3 (three) times daily. 08/10/15  Yes Leone Brand, NP  isosorbide mononitrate (IMDUR) 30 MG 24 hr tablet TAKE 1 TABLET (30 MG TOTAL) BY MOUTH DAILY. 06/22/15  Yes Historical Provider, MD  lactulose (CHRONULAC) 10 GM/15ML solution Take 15 mLs (10 g total) by mouth daily as needed for mild constipation. 06/02/14  Yes Mahima Pandey, MD  latanoprost (XALATAN) 0.005 % ophthalmic solution Place 1 drop into both eyes at bedtime. 06/02/14  Yes Mahima Glade Lloyd, MD  levothyroxine (SYNTHROID, LEVOTHROID) 50 MCG tablet Take 1 tablet (50 mcg total) by mouth daily before breakfast. 08/05/15  Yes Alexa Lucrezia Starch, MD  Multiple Vitamins-Minerals (MULTIVITAMIN WITH MINERALS) tablet Take 1 tablet by mouth every morning.    Yes Historical Provider, MD  pantoprazole (PROTONIX) 40 MG tablet TAKE 1 TABLET BY MOUTH TWICE A DAY 08/11/15  Yes Kirt Boys, DO  Polyethyl Glycol-Propyl Glycol 0.4-0.3 % SOLN Place 1 drop into both eyes 4 (four) times daily.    Yes Historical Provider, MD  polyethylene glycol (MIRALAX / GLYCOLAX) packet Take 17 g by mouth 2 (two) times daily.    Yes Historical Provider, MD  RESTASIS 0.05 % ophthalmic emulsion USE 1 DROP INTO BOTH EYES TWICE DAILY 10/19/14  Yes Kirt Boys, DO  sennosides-docusate sodium (SENOKOT-S) 8.6-50 MG tablet Take 1 tablet by mouth 2 (two) times daily.    Yes Historical Provider, MD  sodium chloride 1 g tablet TAKE 1 TABLET BY MOUTH THREE TIMES DAILY 05/19/15  Yes Kirt Boys, DO  traMADol (ULTRAM) 50 MG tablet Take 50 mg by mouth 2  (two) times daily as needed for moderate pain.    Yes Historical Provider, MD  atorvastatin (LIPITOR) 10 MG tablet Take one tablet by mouth once daily 08/26/15   Kirt Boys, DO  sertraline (ZOLOFT) 100 MG tablet Take one tablet by mouth once daily for depression 08/26/15   Kirt Boys, DO   No Known Allergies Review of Systems :  Patient complains of decreased urination. Vomiting has resolved with N/G placement.  No BM or flatus Physical Exam  Frail elderly coherent female sitting in chair with nasal canula in place Resp:  NAD Extremities:  Able to move all 4, no edema Abdomen:  Firm, no bowel sounds, non-tender.  Vital Signs: BP 154/65 mmHg  Pulse 85  Temp(Src) 98.4 F (36.9 C) (Oral)  Resp 18  Ht 5\' 5"  (1.651 m)  Wt 68.357 kg (150 lb 11.2 oz)  BMI 25.08 kg/m2  SpO2 94% Pain Assessment: No/denies pain   Pain Score: 0-No pain  SpO2: SpO2: 94 % O2 Device:SpO2: 94 % O2 Flow Rate: .O2 Flow Rate (L/min): 1 L/min  IO: Intake/output summary:  Intake/Output Summary (Last 24 hours) at 09/01/15 1551 Last data filed at 09/01/15 1423  Gross per 24 hour  Intake 2433.77 ml  Output    800 ml  Net 1633.77 ml    LBM: Last BM Date: 08/30/15 Baseline Weight: Weight: 67.5 kg (148 lb 13 oz) Most recent weight: Weight: 68.357 kg (150 lb 11.2 oz)     Palliative Assessment/Data:   Flowsheet Rows        Most Recent Value   Intake Tab    Referral Department  Hospitalist   Unit at Time of Referral  Med/Surg Unit   Palliative Care Primary Diagnosis  Other (Comment)   Date Notified  08/31/15   Palliative Care Type  Return patient Palliative Care   Reason for referral  Clarify Goals of Care   Date of Admission  08/26/15   Date first seen by Palliative Care  09/01/15   # of days Palliative referral response time  1 Day(s)   # of days IP prior to Palliative referral  5   Clinical Assessment    Psychosocial & Spiritual Assessment    Palliative Care Outcomes       Time In:  1:00 Time Out: 2:10 Time Total: 70 min. Greater than 50%  of this time was spent counseling and coordinating care related to the above assessment and plan.  Signed by: Stephani Police, PA-C   Please contact Palliative Medicine Team phone at [phone] for questions and concerns.  For individual provider: See Loretha Stapler

## 2015-09-01 NOTE — Plan of Care (Signed)
I was informed by Palliative, Agustina Caroli, PA after the [place] meeting that the daughter requested for different attending physician from tomorrow. I have requested the flow manager to change the attending from the tomorrow (6/1) am.    RAI,RIPUDEEP M.D. [facility] 09/01/2015, 3:36 PM  Pager: [phone]

## 2015-09-01 NOTE — Progress Notes (Signed)
Pharmacy contacted to make aware about critical vanc trough of 50. Vanc hung at 1306 and trough lab draw at 1404 while vanc running. Pharmacy stated they will place new orders for vanc trough to be drawn.

## 2015-09-01 NOTE — Progress Notes (Signed)
Daily Rounding Note  09/01/2015, 11:18 AM  LOS: 6 days   SUBJECTIVE:       400 cc NGT output yesterday, 75 cc recorded thus far today. NGT clamped for ~ 1 hour to allow for po meds, when placed back to suction, 175 cc suctioned. Last BM was around time NGT was placed, 5/29.    OBJECTIVE:         Vital signs in last 24 hours:    Temp:  [98.9 F (37.2 C)-99.2 F (37.3 C)] 99.2 F (37.3 C) (05/31 0457) Pulse Rate:  [86-92] 86 (05/31 1023) Resp:  [16-18] 18 (05/31 0457) BP: (142-156)/(59-78) 147/66 mmHg (05/31 1023) SpO2:  [92 %-96 %] 95 % (05/31 0812) Last BM Date: 08/30/15 Filed Weights   08/26/15 1600 08/26/15 1649  Weight: 67.5 kg (148 lb 13 oz) 68.357 kg (150 lb 11.2 oz)   General: pleasant, alert, frail.   Heart: RRR Chest: clear bil.  No dyspnea or cough Abdomen: active BS, ND, NT  Extremities: no CCE Neuro/Psych:  Pleasant, fully alert and in good spirits.   No gross deficits.   Intake/Output from previous day: 05/30 0701 - 05/31 0700 In: 2201.8 [P.O.:320; I.V.:1581.8; IV Piggyback:300] Out: 500 [Urine:100; Emesis/NG output:400]  Intake/Output this shift: Total I/O In: 422 [P.O.:120; I.V.:302] Out: -   Lab Results:  Recent Labs  08/30/15 0357 08/31/15 0543 09/01/15 0545  WBC 22.3* 26.1* 25.5*  HGB 9.5* 11.0* 9.9*  HCT 28.4* 32.9* 30.0*  PLT 275 290 264   BMET  Recent Labs  08/30/15 0357 08/31/15 0543 09/01/15 0545  NA 139 139 137  K 3.7 3.2* 3.5  CL 108 105 106  CO2 23 23 22   GLUCOSE 104* 73 92  BUN 10 11 12   CREATININE 1.59* 1.66* 1.65*  CALCIUM 8.8* 8.9 8.7*   LFT No results for input(s): PROT, ALBUMIN, AST, ALT, ALKPHOS, BILITOT, BILIDIR, IBILI in the last 72 hours. PT/INR No results for input(s): LABPROT, INR in the last 72 hours. Hepatitis Panel No results for input(s): HEPBSAG, HCVAB, HEPAIGM, HEPBIGM in the last 72 hours.  Studies/Results: No results found.    Scheduled Meds: . amiodarone  100 mg Oral Daily  . amLODipine  10 mg Oral Daily  . atorvastatin  10 mg Oral q1800  . calcium-vitamin D  1 tablet Oral Q breakfast  . cycloSPORINE  1 drop Both Eyes BID  . dextrose  1 Tube Oral Once  . fluticasone  2 spray Each Nare Daily  . gabapentin  300 mg Oral BID  . hydrALAZINE  50 mg Oral TID  . ipratropium-albuterol  3 mL Nebulization TID  . isosorbide mononitrate  30 mg Oral Daily  . latanoprost  1 drop Both Eyes QHS  . levothyroxine  44 mcg Intravenous Daily  . metoCLOPramide (REGLAN) injection  10 mg Intravenous Q12H  . metronidazole  500 mg Intravenous Q8H  . ondansetron  4 mg Intravenous BID  . pantoprazole (PROTONIX) IV  40 mg Intravenous Q24H  . polyethylene glycol  17 g Oral Daily  . polyvinyl alcohol  1 drop Both Eyes QID  . prochlorperazine  5 mg Intravenous Once  . sertraline  100 mg Oral Daily  . simethicone  80 mg Oral QID  . vancomycin  750 mg Intravenous Q24H   Continuous Infusions: . dextrose 5 % and 0.45% NaCl 75 mL/hr at 09/01/15 0555  . heparin 800 Units/hr (08/31/15 1045)   PRN Meds:.acetaminophen **OR**  acetaminophen, bisacodyl, hydrALAZINE, ipratropium-albuterol, lactulose, ondansetron **OR** ondansetron (ZOFRAN) IV  ASSESMENT:   * Acute, recurrent flare of N/V in pt s/p multiple abd surgeries, hx SBOs, pyloric stenosis, functional GOO. Sxs persist despite BID IV PPI, BID Zofran.   .   NGT placed 5/29.  BID Reglan added 5/30 NGT output slowing but still has large volume residuals  Meds could also be contributing esp the Zoloft and the Amiodarone.  * Diarrhea, C diff negative. Diarrhea resolved, in fact got a dose of Miralax this AM!  On empiric C diff Rx with Flagyl IV day 6-7 .  * Osteomyelitis, gangrene in toe. On IV Vanc.   * Afib. On IV heparin in place of Eliquis. On Amiodarone.     PLAN   *  Stop the IV Metronidazole.  ? Increase dose or frequency of Reglan?.  ? Trial of NGT  clamps?    Shannon [name]  09/01/2015, 11:18 AM Pager: [phone]     Attending physician's note   I have taken an interval history, reviewed the chart and examined the patient. I agree with the Advanced Practitioner's note, impression and recommendations. N/V has resolved. NG output has decreased. Clamp NGT and if gastric residuals remain low begin clear liquids and if clears are well tolerated remove NGT. Increase Reglan and stop metronidazole.   Lucio Edward, MD [facility] [phone] Mon-Fri 8a-5p [phone] after 5p, weekends, holidays

## 2015-09-02 DIAGNOSIS — E1169 Type 2 diabetes mellitus with other specified complication: Secondary | ICD-10-CM

## 2015-09-02 DIAGNOSIS — R1111 Vomiting without nausea: Secondary | ICD-10-CM

## 2015-09-02 DIAGNOSIS — M869 Osteomyelitis, unspecified: Secondary | ICD-10-CM

## 2015-09-02 LAB — VANCOMYCIN, TROUGH: Vancomycin Tr: 14 ug/mL (ref 10.0–20.0)

## 2015-09-02 LAB — CBC
HCT: 30.3 % — ABNORMAL LOW (ref 36.0–46.0)
Hemoglobin: 10.2 g/dL — ABNORMAL LOW (ref 12.0–15.0)
MCH: 31.4 pg (ref 26.0–34.0)
MCHC: 33.7 g/dL (ref 30.0–36.0)
MCV: 93.2 fL (ref 78.0–100.0)
Platelets: 270 10*3/uL (ref 150–400)
RBC: 3.25 MIL/uL — ABNORMAL LOW (ref 3.87–5.11)
RDW: 18.8 % — ABNORMAL HIGH (ref 11.5–15.5)
WBC: 25.2 10*3/uL — ABNORMAL HIGH (ref 4.0–10.5)

## 2015-09-02 LAB — DIFFERENTIAL
Basophils Absolute: 0.1 10*3/uL (ref 0.0–0.1)
Basophils Relative: 0 %
Eosinophils Absolute: 17.2 10*3/uL — ABNORMAL HIGH (ref 0.0–0.7)
Eosinophils Relative: 68 %
Lymphocytes Relative: 8 %
Lymphs Abs: 2.1 10*3/uL (ref 0.7–4.0)
Monocytes Absolute: 0.8 10*3/uL (ref 0.1–1.0)
Monocytes Relative: 3 %
Neutro Abs: 5.3 10*3/uL (ref 1.7–7.7)
Neutrophils Relative %: 21 %

## 2015-09-02 LAB — BASIC METABOLIC PANEL
Anion gap: 6 (ref 5–15)
BUN: 8 mg/dL (ref 6–20)
CO2: 25 mmol/L (ref 22–32)
Calcium: 8.6 mg/dL — ABNORMAL LOW (ref 8.9–10.3)
Chloride: 104 mmol/L (ref 101–111)
Creatinine, Ser: 1.43 mg/dL — ABNORMAL HIGH (ref 0.44–1.00)
GFR calc Af Amer: 36 mL/min — ABNORMAL LOW (ref >60)
GFR calc non Af Amer: 31 mL/min — ABNORMAL LOW (ref >60)
Glucose, Bld: 120 mg/dL — ABNORMAL HIGH (ref 65–99)
Potassium: 3.3 mmol/L — ABNORMAL LOW (ref 3.5–5.1)
Sodium: 135 mmol/L (ref 135–145)

## 2015-09-02 LAB — HEPARIN LEVEL (UNFRACTIONATED): Heparin Unfractionated: 0.34 IU/mL (ref 0.30–0.70)

## 2015-09-02 LAB — GLUCOSE, CAPILLARY
Glucose-Capillary: 124 mg/dL — ABNORMAL HIGH (ref 65–99)
Glucose-Capillary: 199 mg/dL — ABNORMAL HIGH (ref 65–99)
Glucose-Capillary: 122 mg/dL — ABNORMAL HIGH (ref 65–99)

## 2015-09-02 MED ORDER — MAGNESIUM SULFATE 2 GM/50ML IV SOLN
2.0000 g | Freq: Once | INTRAVENOUS | Status: AC
  Administered 2015-09-02: 2 g via INTRAVENOUS
  Filled 2015-09-02: qty 50

## 2015-09-02 MED ORDER — BOOST / RESOURCE BREEZE PO LIQD
1.0000 | Freq: Three times a day (TID) | ORAL | Status: DC
  Administered 2015-09-02 – 2015-09-03 (×4): 1 via ORAL
  Administered 2015-09-04: 237 mL via ORAL
  Administered 2015-09-04 – 2015-09-05 (×2): 1 via ORAL

## 2015-09-02 MED ORDER — VANCOMYCIN HCL IN DEXTROSE 750-5 MG/150ML-% IV SOLN
750.0000 mg | INTRAVENOUS | Status: DC
Start: 2015-09-02 — End: 2015-09-03
  Administered 2015-09-02: 750 mg via INTRAVENOUS
  Filled 2015-09-02 (×3): qty 150

## 2015-09-02 MED ORDER — PANTOPRAZOLE SODIUM 40 MG PO TBEC
40.0000 mg | DELAYED_RELEASE_TABLET | Freq: Every day | ORAL | Status: DC
  Administered 2015-09-03 – 2015-09-05 (×3): 40 mg via ORAL
  Filled 2015-09-02 (×3): qty 1

## 2015-09-02 MED ORDER — POTASSIUM CHLORIDE 10 MEQ/100ML IV SOLN
10.0000 meq | INTRAVENOUS | Status: AC
  Administered 2015-09-02 (×4): 10 meq via INTRAVENOUS
  Filled 2015-09-02 (×4): qty 100

## 2015-09-02 NOTE — Progress Notes (Signed)
Brief Nutrition Follow-Up Note  Chart reviewed. Plan is for pt to undergo NGT clamping trials and advance to a clear liquid diet. RD will add Boost Breeze po TID, each supplement provides 250 kcal and 9 grams of protein. RD will continue to follow for diet advancement.   Also noted Palliative Care Team continues to follow for ongoing goals of care discussions.   Parul Porcelli A. Jimmye Norman, RD, LDN, CDE Pager: [phone] After hours Pager: [phone]

## 2015-09-02 NOTE — Progress Notes (Signed)
Pt scheduled to get Vancomycin. Dose was held until Pharm verified safe to give. Dose was not available on unit. Pt receiving IV heparin and Potassium. Vancomycin to be rescheduled by Pharmacy.

## 2015-09-02 NOTE — Progress Notes (Signed)
Flippin for heparin Indication: atrial fibrillation  No Known Allergies  Patient Measurements: Height: 5\' 5"  (165.1 cm) Weight: 150 lb 11.2 oz (68.357 kg) IBW/kg (Calculated) : 57 Heparin Dosing Weight: 68.4  Vital Signs: Temp: 98.6 F (37 C) (06/01 0525) Temp Source: Oral (06/01 0525) BP: 139/67 mmHg (06/01 0525) Pulse Rate: 78 (06/01 0525)  Labs:  Recent Labs  08/31/15 0543 09/01/15 0545 09/02/15 0544  HGB 11.0* 9.9* 10.2*  HCT 32.9* 30.0* 30.3*  PLT 290 264 270  HEPARINUNFRC 0.29* 0.44 0.34  CREATININE 1.66* 1.65* 1.43*    Estimated Creatinine Clearance: 25.4 mL/min (by C-G formula based on Cr of 1.43).   Assessment: 82 yo woman admitted 08/26/2015 with abdominal pain, she is on apixaban PTA for hx AFlutter. CHADSVASc at least 5. Now on IV heparin while holding apixaban. Heparin level = 0.34, therapeutic on 800 units/hr. hgb 10.2 stable, pltc wnl. No bleeding noted per chart.   Goal of Therapy:  Heparin level 0.3-0.7 units/ml Monitor platelets by anticoagulation protocol: Yes   Plan:  Continue heparin 800 units/hr Daily HL, CBC Monitor s/sx bleeding  Elicia Lamp, PharmD, [facility] Clinical Pharmacist Pager [phone] 09/02/2015 10:26 AM

## 2015-09-02 NOTE — Progress Notes (Signed)
Daily Progress Note   Patient Name: Shannon [name]       Date: 09/02/2015 DOB: [birth year]  Age: 82 y.o. MRN#: [id number] Attending Physician: Joseph Art, DO Primary Care Physician: Kirt Boys, DO Admit Date: 08/26/2015  Reason for Consultation/Follow-up: Establishing goals of care and Psychosocial/spiritual support  Subjective: Patient feeling better.  Explained clearly to both patient and daughter [name]) the concern that the osteomyelitis in her toe could seed an overwhelming systemic infection.  Patient understands and would like to limit therapy to antibiotics.  Daughter supports her mother's decision and feels that her mother was admitted to the hospital for her stomach problems - not her toe.  They will follow up with the wound care center after discharge to continue to care for the osteo in her toe.   [name] asked me about artificial feeding for her mother.  We discussed a PEG tube - which I explained her mother would be a very poor candidate for due to previous stomach surgeries, gut swelling, etc... Furthermore a PEG would not solve her problem because it would use the same gut she is having trouble with now.   We also discussed TPN.  After describing TPN in detail, [name] stated her mother would not want that.  I discussed Hospice services with [name] and let her clearly know that her mother is eligible for Hospice services at home.  At this point, the patient is not ready to accept Hospice services.  I will continue to follow Mrs. Rodin inpatient to offer her psycho-social support.   [name]  Current Medications: Scheduled Meds:  . amiodarone  100 mg Oral Daily  . amLODipine  10 mg Oral Daily  . antiseptic oral rinse  7 mL Mouth Rinse q12n4p  . atorvastatin  10 mg  Oral q1800  . calcium-vitamin D  1 tablet Oral Q breakfast  . chlorhexidine  15 mL Mouth Rinse BID  . cycloSPORINE  1 drop Both Eyes BID  . dextrose  1 Tube Oral Once  . fluticasone  2 spray Each Nare Daily  . gabapentin  300 mg Oral BID  . hydrALAZINE  50 mg Oral TID  . isosorbide mononitrate  30 mg Oral Daily  . latanoprost  1 drop Both Eyes QHS  . levothyroxine  44 mcg Intravenous Daily  .  metoCLOPramide (REGLAN) injection  10 mg Intravenous TID  . ondansetron  4 mg Intravenous BID  . pantoprazole sodium  40 mg Per Tube QHS  . polyethylene glycol  17 g Oral Daily  . polyvinyl alcohol  1 drop Both Eyes QID  . prochlorperazine  5 mg Intravenous Once  . sertraline  100 mg Oral Daily  . simethicone  80 mg Oral QID  . vancomycin  750 mg Intravenous Q24H    Continuous Infusions: . dextrose 5 % and 0.45% NaCl 75 mL/hr at 09/01/15 0555  . heparin 800 Units/hr (09/01/15 1753)    PRN Meds: acetaminophen **OR** acetaminophen, bisacodyl, hydrALAZINE, ipratropium-albuterol, lactulose, ondansetron **OR** ondansetron (ZOFRAN) IV  Physical Exam    Frail, elderly female.  Sharp and spry for her 89 years CV rrr Resp NAD Abdomen quiet, soft, NT Extremities:  Able to move all 4.  No edema       Vital Signs: BP 139/67 mmHg  Pulse 78  Temp(Src) 98.6 F (37 C) (Oral)  Resp 18  Ht 5\' 5"  (1.651 m)  Wt 68.357 kg (150 lb 11.2 oz)  BMI 25.08 kg/m2  SpO2 99% SpO2: SpO2: 99 % O2 Device: O2 Device: Not Delivered O2 Flow Rate: O2 Flow Rate (L/min): 1 L/min  Intake/output summary:  Intake/Output Summary (Last 24 hours) at 09/02/15 0929 Last data filed at 09/01/15 1800  Gross per 24 hour  Intake 954.52 ml  Output    500 ml  Net 454.52 ml   LBM: Last BM Date: 09/01/15 Baseline Weight: Weight: 67.5 kg (148 lb 13 oz) Most recent weight: Weight: 68.357 kg (150 lb 11.2 oz)       Palliative Assessment/Data:    Flowsheet Rows        Most Recent Value   Intake Tab    Referral  Department  Hospitalist   Unit at Time of Referral  Med/Surg Unit   Palliative Care Primary Diagnosis  Other (Comment)   Date Notified  08/31/15   Palliative Care Type  Return patient Palliative Care   Reason for referral  Clarify Goals of Care   Date of Admission  08/26/15   Date first seen by Palliative Care  09/01/15   # of days Palliative referral response time  1 Day(s)   # of days IP prior to Palliative referral  5   Clinical Assessment    Psychosocial & Spiritual Assessment    Palliative Care Outcomes       Patient Active Problem List   Diagnosis Date Noted  . Emesis   . Adjustment disorder with anxiety 08/29/2015  . Diabetic ulcer of toe of right foot associated with type 2 diabetes mellitus, with necrosis of muscle (HCC)   . Diverticulitis of large intestine without perforation or abscess without bleeding   . Colitis 08/26/2015  . Atrial fibrillation (HCC) 08/26/2015  . N&V (nausea and vomiting)   . Acute on chronic diastolic heart failure (HCC) 08/02/2015  . Esophageal candidiasis (HCC)   . Goals of care, counseling/discussion   . Encounter for hospice care discussion   . Dyspnea   . SOB (shortness of breath)   . Acute renal failure superimposed on stage 3 chronic kidney disease (HCC)   . Encounter for long-term (current) use of other high-risk medications   . Respiratory distress 07/06/2015  . Atrial flutter with rapid ventricular response (HCC) 07/06/2015  . Dysphagia 07/05/2015  . Asthma with allergic rhinitis 06/04/2015  . Hypertensive heart disease 03/18/2015  . S/P exploratory  laparotomy 01/20/15 01/28/2015  . Malnutrition of moderate degree 01/28/2015  . Syncope 01/27/2015  . HCAP (healthcare-associated pneumonia) 01/23/2015  . Fever, unspecified 01/22/2015  . Bradycardia 01/21/2015  . Hypotension 01/21/2015  . Palliative care encounter   . DNR (do not resuscitate) discussion   . Protein-calorie malnutrition, severe 01/12/2015  . Elevated troponin  01/12/2015  . HTN (hypertension), uncontrolled 01/12/2015  . AP (abdominal pain) 01/10/2015  . SBO (small bowel obstruction) (HCC) 01/10/2015  . SIADH (syndrome of inappropriate ADH production) (HCC) 01/10/2015  . Cerebrovascular disease 07/14/2014  . Diabetic polyneuropathy associated with type 2 diabetes mellitus (HCC) 07/14/2014  . Hyperlipidemia 07/14/2014  . Gastric distention   . Gastric outlet obstruction 05/21/2014  . Unstable gait 04/29/2014  . Hyponatremia 03/23/2014  . Acute encephalopathy 03/22/2014  . Slurred speech   . Chest pain   . Gastroparesis   . Slow transit constipation 02/03/2014  . Carotid stenosis 02/03/2014  . Demand ischemia (HCC) 01/21/2014  . Gastric bezoar 01/20/2014  . Anemia of chronic disease 01/18/2014  . CKD (chronic kidney disease) stage 3, GFR 30-59 ml/min 01/18/2014  . Depression 09/29/2013  . Perforated ulcer (HCC) 09/29/2013  . Essential hypertension, benign 09/22/2013  . GERD (gastroesophageal reflux disease) 09/22/2013  . Dyslipidemia 09/22/2013  . DM (diabetes mellitus) type II uncontrolled, periph vascular disorder (HCC) 09/22/2013  . A-fib (HCC) 09/22/2013  . CVA (cerebral vascular accident) (HCC) 09/22/2013  . Diabetic neuropathy (HCC) 09/22/2013  . Hypothyroidism 09/22/2013  . Chronic gastrojejunal ulcer with perforation (HCC) 09/07/2013  . Diabetes (HCC) 09/07/2013  . Accelerated hypertension 09/07/2013  . Systemic lupus erythematosus (SLE) inhibitor 09/07/2013  . Other specified abnormal immunological findings in serum 09/07/2013    Palliative Care Assessment & Plan   Patient Profile: 82 y.o. female with past medical history of perforated gastric ulcer, multiple gastric surgeries, DM2, COPD, CKD 3, NSTEMI, hyperthyroidism, and multiple SBOs. She was admitted on 08/26/2015 with nausea and vomiting. The patient has no evidence for bowel obstruction on CT. [facility] was consulted and has been follow the patient.  She has been very slow to improve. Her symptoms of vomiting resolved with N/G placement, reglan, flagyl and conservative therapy - but she still has no bowel sounds and is not passing flatus. Further, the patient has a gangrenous right 3rd toe for which she has refused amputation. She is requested only continued antibiotic therapy. Psychiatry evaluation indicated she has capacity to make her own decisions.   Assessment: Limited Code.  HCPOA and Living Will are in place.   Patient is hospice eligible but refuses Hospice services at this time.  Recommendations/Plan:  I will continue to follow at a distance while she remains inpatient  Recommend Palliative Medicine to follow up outpatient.  If you agree please put in a Palliative Outpatient request to Case Management  Goals of Care and Additional Recommendations:  Limitations on Scope of Treatment: Full Scope Treatment, No CPR, No Intubation.  No artificial feeding.               Code Status: Limited  Prognosis:  Less than 6 mos.   Discharge Planning:  Home with Palliative Services and home health.  Care plan was discussed with [facility] MD, Patient and daughter  Thank you for allowing the Palliative Medicine Team to assist in the care of this patient.   Time In: 10:00 Time Out: 10:35 Total Time 35 min Prolonged Time Billed no      Greater than 50%  of this time  was spent counseling and coordinating care related to the above assessment and plan.  Stephani Police, PA-C  Please contact Palliative Medicine Team phone at [phone] for questions and concerns.

## 2015-09-02 NOTE — Progress Notes (Signed)
[facility]                                                                              Patient Demographics  Shannon [name], is a 82 y.o. female, DOB - 1923/08/01, ZOX:[id number]  Admit date - 08/26/2015   Admitting Physician Joseph Art, DO  Outpatient Primary MD for the patient is Kirt Boys, DO  Outpatient specialists:   LOS - 7  days    Chief Complaint  Patient presents with  . Abdominal Pain       Brief summary   Shannon [name] is a 82 y.o. female with and extensive past medical and surgical history . She's had multiple intestinal surgeries as listed below. Patient known to [facility]. Patient developed nausea several days ago then yesterday began vomiting. She has minor diarrhea and lower abdominal pain a couple of days ago. She just completed antibiotics for toe ulcer.  NG tube placed for Gastric outlet, being clamped and trial of clears.  Patient has refused amputation of toe with osteomyelitis.  Continue IV abx.  Palliative care following   Assessment & Plan     Intractable Nausea and vomiting: Patient has long-standing GI and surgical history including a sigmoidectomy, remote gastro-jejunostomy and suspected vagotomy, resection of gastrojejunal anastomosis 2015 at [facility]. She has a history of gastroparesis with gastric bezoar formation, history of severe candida esophagitis, SBO requiring exp lap and LOA Oct 2016. Known to Dr. Marina Goodell with [facility].  - Last EGD for dysphagia in 4/16 showed diffuse candidiasis in the entire esophagus, gastric stenosis at the pylorus, normal duodenum  - No SBO on CT abdomen, Completed IV Flagyl, C. difficile negative - Continue IV PPI, on scheduled Reglan, continue Zofran - NGT clamped, trial of clears - Potassium replacement, keep K ~4  Diabetic ulcer, right third toe with gangrene  - Patient follows wound care Center, had completed a course of doxycycline prior to admission - Patient was seen by orthopedics, Dr. Victorino Dike  and vascular surgery, Dr. early both recommended amputation of the right third toe, patient declined.Per vascular surgery, would not recommend any arteriogram for further evaluation. - X-rays of the foot suggestive of osteomyelitis of the right third toe. MRI of the foot showed osteomyelitis of the phalanges of the third toe involving the middle and distal phalanges - Psychiatry was consulted, patient has capacity to make decisions. -change to oral antibiotics for 4 weeks at the time of discharge.  - Leukocytosis, WBCs 25 today, palliative care consult for goals of care -discussed the possibility of sepsis and worsening of condition  Asthma - continue duo nebs  Diabetes, type II, diet controlled - A1c 6.5  Chronic diastolic CHF, on imdur, lasix 20mg  daily.  - Creatinine is improving  Hypertension uncontrolled - restart Imdur, hydralazine  Acute on CKD, stage 3. Cr 1.99, baseline 1.3  - Creatinine close to baseline, continue to hold Lasix  Hypothyroidism. TSH 08/02/15 was 83.  -Change home oral Synthroid to IV, TSH 84.8 -Increase Synthroid to 88 MCG, for now on IV 44 MCG daily - Follow up TSH in 4 weeks outpatient  Constipation, on miralax and senokot, lactulose. -  Dulcolax suppositories as needed  Depression. Stable.  - NPO  GERD. On PPI at home - IV PPI  Code Status: Partial code  DVT Prophylaxis: On heparin drip   Family Communication: called daughter    Disposition Plan: Once tolerating diet  Time Spent in minutes  25 minutes  Procedures:  CT abdomen  Consultants:   GI Orthopedics Vascular surgery  Antimicrobials:   IV ciprofloxacin 5/25 -526  IV Flagyl 5/25   Medications  Scheduled Meds: . amiodarone  100 mg Oral Daily  . amLODipine  10 mg Oral Daily  . antiseptic oral rinse  7 mL Mouth Rinse q12n4p  . atorvastatin  10 mg Oral q1800  . calcium-vitamin D  1 tablet Oral Q breakfast  . chlorhexidine  15 mL Mouth Rinse BID  . cycloSPORINE  1  drop Both Eyes BID  . dextrose  1 Tube Oral Once  . feeding supplement  1 Container Oral TID BM  . fluticasone  2 spray Each Nare Daily  . gabapentin  300 mg Oral BID  . hydrALAZINE  50 mg Oral TID  . isosorbide mononitrate  30 mg Oral Daily  . latanoprost  1 drop Both Eyes QHS  . levothyroxine  44 mcg Intravenous Daily  . metoCLOPramide (REGLAN) injection  10 mg Intravenous TID  . ondansetron  4 mg Intravenous BID  . pantoprazole sodium  40 mg Per Tube QHS  . polyethylene glycol  17 g Oral Daily  . polyvinyl alcohol  1 drop Both Eyes QID  . prochlorperazine  5 mg Intravenous Once  . sertraline  100 mg Oral Daily  . simethicone  80 mg Oral QID  . vancomycin  750 mg Intravenous Q24H   Continuous Infusions: . dextrose 5 % and 0.45% NaCl 75 mL/hr at 09/01/15 0555  . heparin 800 Units/hr (09/01/15 1753)   PRN Meds:.acetaminophen **OR** acetaminophen, bisacodyl, hydrALAZINE, ipratropium-albuterol, lactulose, ondansetron **OR** ondansetron (ZOFRAN) IV   Antibiotics   Anti-infectives    Start     Dose/Rate Route Frequency Ordered Stop   08/30/15 1200  vancomycin (VANCOCIN) IVPB 1000 mg/200 mL premix  Status:  Discontinued     1,000 mg 200 mL/hr over 60 Minutes Intravenous Every 48 hours 08/28/15 1135 08/29/15 0911   08/29/15 1400  vancomycin (VANCOCIN) IVPB 750 mg/150 ml premix     750 mg 150 mL/hr over 60 Minutes Intravenous Every 24 hours 08/29/15 0911     08/29/15 1000  piperacillin-tazobactam (ZOSYN) IVPB 3.375 g  Status:  Discontinued     3.375 g 12.5 mL/hr over 240 Minutes Intravenous Every 8 hours 08/29/15 0909 08/30/15 1343   08/28/15 1200  vancomycin (VANCOCIN) 1,250 mg in sodium chloride 0.9 % 250 mL IVPB     1,250 mg 166.7 mL/hr over 90 Minutes Intravenous  Once 08/28/15 1124 08/28/15 1605   08/27/15 1100  ciprofloxacin (CIPRO) IVPB 400 mg  Status:  Discontinued     400 mg 200 mL/hr over 60 Minutes Intravenous Every 24 hours 08/26/15 1648 08/27/15 1414   08/26/15 2300   ciprofloxacin (CIPRO) IVPB 400 mg  Status:  Discontinued     400 mg 200 mL/hr over 60 Minutes Intravenous Every 12 hours 08/26/15 1646 08/26/15 1648   08/26/15 2000  metroNIDAZOLE (FLAGYL) IVPB 500 mg  Status:  Discontinued     500 mg 100 mL/hr over 60 Minutes Intravenous Every 8 hours 08/26/15 1646 09/01/15 1144   08/26/15 1600  metroNIDAZOLE (FLAGYL) IVPB 500 mg  Status:  Discontinued  500 mg 100 mL/hr over 60 Minutes Intravenous Every 8 hours 08/26/15 1559 08/26/15 1645   08/26/15 1600  ciprofloxacin (CIPRO) IVPB 400 mg  Status:  Discontinued     400 mg 200 mL/hr over 60 Minutes Intravenous Every 12 hours 08/26/15 1559 08/26/15 1646   08/26/15 1130  ciprofloxacin (CIPRO) IVPB 400 mg     400 mg 200 mL/hr over 60 Minutes Intravenous  Once 08/26/15 1119 08/26/15 1311   08/26/15 1130  metroNIDAZOLE (FLAGYL) IVPB 500 mg     500 mg 100 mL/hr over 60 Minutes Intravenous  Once 08/26/15 1119 08/26/15 1311        Subjective:   Tolerating PO diet so far  Objective:   Filed Vitals:   09/01/15 1023 09/01/15 1438 09/01/15 2112 09/02/15 0525  BP: 147/66 154/65 159/72 139/67  Pulse: 86 85 84 78  Temp:  98.4 F (36.9 C) 98.8 F (37.1 C) 98.6 F (37 C)  TempSrc:   Oral Oral  Resp:  18 16 18   Height:      Weight:      SpO2:  94% 94% 99%    Intake/Output Summary (Last 24 hours) at 09/02/15 1310 Last data filed at 09/02/15 1237  Gross per 24 hour  Intake    805 ml  Output    250 ml  Net    555 ml     Wt Readings from Last 3 Encounters:  08/26/15 68.357 kg (150 lb 11.2 oz)  08/10/15 67.495 kg (148 lb 12.8 oz)  08/06/15 66.951 kg (147 lb 9.6 oz)     Exam  General: Alert and oriented x 3, NAD, NGT clamped  Cardiovascular: S1 S2 clear  Respiratory: CTAB  Gastrointestinal: Soft, NT, ND, NBS  Ext: no cyanosis clubbing or edema, Right third toe- wrapped  Psych: Normal affect and demeanor, alert and oriented x3    Data Reviewed:  I have personally reviewed  following labs and imaging studies  Micro Results Recent Results (from the past 240 hour(s))  Urine culture     Status: None   Collection Time: 08/26/15  7:37 AM  Result Value Ref Range Status   Specimen Description URINE, CATHETERIZED  Final   Special Requests NONE  Final   Culture NO GROWTH  Final   Report Status 08/27/2015 FINAL  Final  C difficile quick scan w PCR reflex     Status: None   Collection Time: 08/28/15  6:14 AM  Result Value Ref Range Status   C Diff antigen NEGATIVE NEGATIVE Final   C Diff toxin NEGATIVE NEGATIVE Final   C Diff interpretation Negative for toxigenic C. difficile  Final    Radiology Reports Ct Abdomen Pelvis Wo Contrast  08/26/2015  CLINICAL DATA:  Left upper quadrant pain with nausea and vomiting for 1 week. EXAM: CT ABDOMEN AND PELVIS WITHOUT CONTRAST TECHNIQUE: Multidetector CT imaging of the abdomen and pelvis was performed following the standard protocol without IV contrast. COMPARISON:  07/05/2015 FINDINGS: Lower chest and abdominal wall: Extensive ventral abdominal wall scarring with a fatty hernia left of midline, likely incisional. There is fluid within the thick walled lower esophagus which contains refluxed or non cleared fluid. Eventration of the right diaphragm. Hepatobiliary: Multiple hepatic low densities fairly circumscribed and likely cysts.Cholecystectomy. Pancreas: Unremarkable. Spleen: Unremarkable. Adrenals/Urinary Tract: Negative adrenals. No hydronephrosis or stone. Bladder wall thickening. Very Peristranding could be related to generalized edema or cystitis. Reproductive:No pathologic findings. Stomach/Bowel: The stomach is circumferentially thick walled and moderately distended. The antral  region is particularly thickened and collapsed. Suture along the lower gastric body. No small bowel dilatation. There is a proximal enteroenteric anastomosis. The proximal colon shows wall thickening and pericolonic edema, greatest along the proximal  transverse segment. Colonic inflammatory changes are fairly extensive, more compatible with colitis and diverticulitis. No pneumatosis or pneumoperitoneum. Scattered colonic and ileal diverticulosis. Possible previous sigmoidectomy. Vascular/Lymphatic: No acute vascular Find.  No mass or adenopathy. Peritoneal: No ascites or pneumoperitoneum. Musculoskeletal: No acute abnormalities. Remote appearing T11 superior endplate compression fracture. Lumbar facet arthropathy with mild L5-S1 anterolisthesis. IMPRESSION: 1. Moderately distended stomach with thickened antral region. Patient has history of chronic impaired gastric emptying and recent EGD. 2. Signs of gastritis and esophagitis. 3. Colitis with nonspecific pattern. 4. Cystitis, correlate with urinalysis. Electronically Signed   By: Marnee Spring M.D.   On: 08/26/2015 09:24   Dg Chest 2 View  08/26/2015  CLINICAL DATA:  Abdominal pain, nausea and vomiting. Gastroesophageal reflux. EXAM: CHEST  2 VIEW COMPARISON:  08/02/2015 and chest CT dated 07/07/2015. FINDINGS: Borderline enlarged cardiac silhouette with an interval decrease in size. Linear density at both lung bases. Mildly prominent pulmonary vasculature. Mild diffuse peribronchial thickening. Stable right diaphragmatic eventration. Atheromatous arterial calcifications. Upper abdominal surgical clips. Diffuse osteopenia. Bilateral shoulder degenerative changes. IMPRESSION: 1. Mildly improved left basilar linear atelectasis and possible scarring. 2. Interval mild right basilar linear atelectasis. 3. Improved cardiomegaly. 4. Mild pulmonary vascular congestion. 5. Mild bronchitic changes. Electronically Signed   By: Beckie Salts M.D.   On: 08/26/2015 07:22   Mr Foot Right Wo Contrast  08/29/2015  CLINICAL DATA:  Right third toe diabetic ulcer. Necrosis of muscle. Fragmentation of the tuft of the distal phalanx third toe. Query osteomyelitis. EXAM: MRI OF THE RIGHT FOREFOOT WITHOUT CONTRAST TECHNIQUE:  Multiplanar, multisequence MR imaging was performed. No intravenous contrast was administered. COMPARISON:  08/27/2015 FINDINGS: Severe edema signal and poor cortical definition in the middle and distal phalanges of the third toe compatible with acute osteomyelitis. These 2 phalanges might be fused. There is also osteomyelitis involving the distal head of the proximal phalanx of the third toe. Low-level edema in the shaft of the proximal phalanx second toe is noted but may be reactive, and is not as fulminant as I would expect for osteomyelitis. Lisfranc ligament intact. There is considerable degenerative arthropathy at the Lisfranc joint. Hallux valgus noted with bony bunion and degenerative spurring at the first MTP joint. The second digit is less flexed than the other digits, and accordingly is dorsally positioned. Abnormal subcutaneous edema noted along the dorsal and plantar forefoot. There is low-level edema tracking along the plantar musculature of the foot. Patchy degenerative edema at the calcaneocuboid articulation is seen on the coronal images. Small degenerative subcortical cystic lesion in the navicular. IMPRESSION: 1. Osteomyelitis of the phalanges of the third toe, more severely involving the middle and distal phalanges, but also with some distal involvement of the proximal phalanx. Surrounding cellulitis in the toe and also tracking in surrounding toes, the dorsum of the foot, and plantar foot. 2. Edema tracks along the plantar musculature of the foot and myositis is not excluded. 3. 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 Hallux valgus and bony bunion noted. 4. Subtle edema in the proximal phalanx of the second toe, favor reactive etiology rather than osteomyelitis of the second toe. Electronically Signed   By: Gaylyn Rong M.D.   On: 08/29/2015 11:25   Dg Foot 2 Views Right  08/19/2015  AP and lateral xrays right foot were taken. Midfoot arthritis noted.  Hammer  toe digit.  HAV deformity 1st MPJ.  There is white bone in third toe with no evidence of cystic formation.  No fracture dislocation or tumor noted.  Bone density appears normal for patients age and sex.  Joint space appears normal.  No frank deformity.   Helane Gunther DPM  Dg Foot Complete Right  08/27/2015  CLINICAL DATA:  Diabetic ulcer right foot, muscle necrosis, please evaluate bony changes at distal phalanx right third toe EXAM: RIGHT FOOT COMPLETE - 3+ VIEW COMPARISON:  08/17/2015 and 08/26/2014 FINDINGS: Three views of the right foot submitted. No acute fracture or subluxation. Again noted hallux valgus deformity. There is diffuse osteopenia. Small plantar spur of calcaneus. Atherosclerotic vascular calcifications are noted. There is dorsal spurring tarsal metatarsal joint. Significant soft tissue swelling noted right third toe. There is osteo lysis and some bony fragmentation at the tip of distal phalanx third third toe. Osteomyelitis cannot be excluded. Further correlation with MRI is recommended. IMPRESSION: No acute fracture or subluxation. Again noted hallux valgus deformity. There is diffuse osteopenia. Small plantar spur of calcaneus. Atherosclerotic vascular calcifications are noted. There is dorsal spurring tarsal metatarsal joint. Significant soft tissue swelling noted right third toe. There is osteo lysis and some bony fragmentation at the tip of distal phalanx third third toe. Osteomyelitis cannot be excluded. Further correlation with MRI is recommended. Electronically Signed   By: Natasha Mead M.D.   On: 08/27/2015 16:34    Lab Data:  CBC:  Recent Labs Lab 08/29/15 0430 08/30/15 0357 08/31/15 0543 09/01/15 0545 09/02/15 0544 09/02/15 0932  WBC 21.8* 22.3* 26.1* 25.5* 25.2*  --   NEUTROABS  --   --   --   --   --  5.3  HGB 10.4* 9.5* 11.0* 9.9* 10.2*  --   HCT 31.2* 28.4* 32.9* 30.0* 30.3*  --   MCV 95.4 94.4 94.8 94.0 93.2  --   PLT 267 275 290 264 270  --    Basic  Metabolic Panel:  Recent Labs Lab 08/29/15 0430 08/30/15 0357 08/31/15 0543 09/01/15 0545 09/02/15 0544  NA 141 139 139 137 135  K 3.3* 3.7 3.2* 3.5 3.3*  CL 109 108 105 106 104  CO2 23 23 23 22 25   GLUCOSE 87 104* 73 92 120*  BUN 11 10 11 12 8   CREATININE 1.50* 1.59* 1.66* 1.65* 1.43*  CALCIUM 9.1 8.8* 8.9 8.7* 8.6*  MG  --   --   --  1.5*  --    GFR: Estimated Creatinine Clearance: 25.4 mL/min (by C-G formula based on Cr of 1.43). Liver Function Tests: No results for input(s): AST, ALT, ALKPHOS, BILITOT, PROT, ALBUMIN in the last 168 hours. No results for input(s): LIPASE, AMYLASE in the last 168 hours. No results for input(s): AMMONIA in the last 168 hours. Coagulation Profile: No results for input(s): INR, PROTIME in the last 168 hours. Cardiac Enzymes: No results for input(s): CKTOTAL, CKMB, CKMBINDEX, TROPONINI in the last 168 hours. BNP (last 3 results) No results for input(s): PROBNP in the last 8760 hours. HbA1C: No results for input(s): HGBA1C in the last 72 hours. CBG:  Recent Labs Lab 09/01/15 1144 09/01/15 1708 09/01/15 2110 09/02/15 0751 09/02/15 1148  GLUCAP 101* 131* 130* 124* 199*   Lipid Profile: No results for input(s): CHOL, HDL, LDLCALC, TRIG, CHOLHDL, LDLDIRECT in the last 72 hours. Thyroid Function Tests: No results for input(s): TSH, T4TOTAL,  FREET4, T3FREE, THYROIDAB in the last 72 hours. Anemia Panel: No results for input(s): VITAMINB12, FOLATE, FERRITIN, TIBC, IRON, RETICCTPCT in the last 72 hours. Urine analysis:    Component Value Date/Time   COLORURINE YELLOW 08/26/2015 0737   APPEARANCEUR CLEAR 08/26/2015 0737   LABSPEC 1.022 08/26/2015 0737   PHURINE 6.5 08/26/2015 0737   GLUCOSEU NEGATIVE 08/26/2015 0737   HGBUR NEGATIVE 08/26/2015 0737   BILIRUBINUR NEGATIVE 08/26/2015 0737   KETONESUR 15* 08/26/2015 0737   PROTEINUR 100* 08/26/2015 0737   UROBILINOGEN 0.2 01/27/2015 2308   NITRITE NEGATIVE 08/26/2015 0737    LEUKOCYTESUR NEGATIVE 08/26/2015 0737     Eziah Negro U Deriana Vanderhoef DO. [facility] 09/02/2015, 1:10 PM  Pager: [phone]   After 7pm go to [email or web address] - password [email or web address]  Call night coverage person covering after 7pm

## 2015-09-02 NOTE — Progress Notes (Signed)
Daily Rounding Note  09/02/2015, 8:21 AM  LOS: 7 days   SUBJECTIVE:       NGT remains clamped this morning.  She feels hungry, the first time in quite some time.  No N/V overnight.    OBJECTIVE:         Vital signs in last 24 hours:    Temp:  [98.4 F (36.9 C)-98.8 F (37.1 C)] 98.6 F (37 C) (06/01 0525) Pulse Rate:  [78-86] 78 (06/01 0525) Resp:  [16-18] 18 (06/01 0525) BP: (139-159)/(65-72) 139/67 mmHg (06/01 0525) SpO2:  [94 %-99 %] 99 % (06/01 0525) Last BM Date: 09/01/15 Filed Weights   08/26/15 1600 08/26/15 1649  Weight: 67.5 kg (148 lb 13 oz) 68.357 kg (150 lb 11.2 oz)   General: pleasant, frail, NAD, comfortable   Heart: RRR. Chest: clear bil.  No dyspnea or cough.  Voice persistently hoarse.   Abdomen: soft, some BS, though diminished. NT  Extremities: no CCE.   Neuro/Psych:  Pleasant, cooperative.  Alert, no gross deficits.    Intake/Output from previous day: 05/31 0701 - 06/01 0700 In: 1057 [P.O.:120; I.V.:787; IV Piggyback:150] Out: 500 [Urine:200; Emesis/NG output:300]  Intake/Output this shift:    Lab Results:  Recent Labs  08/31/15 0543 09/01/15 0545 09/02/15 0544  WBC 26.1* 25.5* 25.2*  HGB 11.0* 9.9* 10.2*  HCT 32.9* 30.0* 30.3*  PLT 290 264 270   BMET  Recent Labs  08/31/15 0543 09/01/15 0545 09/02/15 0544  NA 139 137 135  K 3.2* 3.5 3.3*  CL 105 106 104  CO2 23 22 25   GLUCOSE 73 92 120*  BUN 11 12 8   CREATININE 1.66* 1.65* 1.43*  CALCIUM 8.9 8.7* 8.6*   LFT No results for input(s): PROT, ALBUMIN, AST, ALT, ALKPHOS, BILITOT, BILIDIR, IBILI in the last 72 hours. PT/INR No results for input(s): LABPROT, INR in the last 72 hours. Hepatitis Panel No results for input(s): HEPBSAG, HCVAB, HEPAIGM, HEPBIGM in the last 72 hours.  Studies/Results: No results found.   Scheduled Meds: . amiodarone  100 mg Oral Daily  . amLODipine  10 mg Oral Daily  . antiseptic oral  rinse  7 mL Mouth Rinse q12n4p  . atorvastatin  10 mg Oral q1800  . calcium-vitamin D  1 tablet Oral Q breakfast  . chlorhexidine  15 mL Mouth Rinse BID  . cycloSPORINE  1 drop Both Eyes BID  . dextrose  1 Tube Oral Once  . fluticasone  2 spray Each Nare Daily  . gabapentin  300 mg Oral BID  . hydrALAZINE  50 mg Oral TID  . isosorbide mononitrate  30 mg Oral Daily  . latanoprost  1 drop Both Eyes QHS  . levothyroxine  44 mcg Intravenous Daily  . metoCLOPramide (REGLAN) injection  10 mg Intravenous TID  . ondansetron  4 mg Intravenous BID  . pantoprazole sodium  40 mg Per Tube QHS  . polyethylene glycol  17 g Oral Daily  . polyvinyl alcohol  1 drop Both Eyes QID  . prochlorperazine  5 mg Intravenous Once  . sertraline  100 mg Oral Daily  . simethicone  80 mg Oral QID  . vancomycin  750 mg Intravenous Q24H   Continuous Infusions: . dextrose 5 % and 0.45% NaCl 75 mL/hr at 09/01/15 0555  . heparin 800 Units/hr (09/01/15 1753)   PRN Meds:.acetaminophen **OR** acetaminophen, bisacodyl, hydrALAZINE, ipratropium-albuterol, lactulose, ondansetron **OR** ondansetron (ZOFRAN) IV   ASSESMENT:   *  Acute, recurrent flare of N/V in pt s/p multiple abd surgeries, hx SBOs, pyloric stenosis, functional GOO. Sxs persist despite BID IV PPI, BID Zofran.  .  NGT placed 5/29.  Clamped 5/31.  BID Reglan added 5/30, changed to TID 5/31.  Meds could also be contributing esp the Zoloft and the Amiodarone.  * Osteomyelitis, gangrene in toe. On IV Vanc.  Refuses suggested amputation.   * Afib. On IV heparin in place of Eliquis. On Amiodarone.    PLAN   *   Allow clears while NGT still in place.  If in a few hours, after starting clears, she still is free of N/V, will remove NGT and advance to full liquids.  Then we can convert to po Reglan, po Protonix in AM.      Azucena Freed  09/02/2015, 8:21 AM Pager: [phone]     Attending physician's note   I have taken an interval history,  reviewed the chart and examined the patient. I agree with the Advanced Practitioner's note, impression and recommendations. Clear liquids with clamped NGT in place and if well tolerated remove NGT later today. Advance to full liquids tomorrow if she does well with clears today. Continue PPI, Reglan and Zofran.   Lucio Edward, MD [facility] [phone] Mon-Fri 8a-5p [phone] after 5p, weekends, holidays

## 2015-09-02 NOTE — Progress Notes (Signed)
Pharmacy Antibiotic Note  Shannon [name] is a 82 y.o. female admitted on 08/26/2015 with nausea/vomiting/fatigue. Incidentally found R third toe ulcer. MRI positive for osteomyelitis. Ortho saw the patient and recommended an amputation, however the patient refused. She has been on vancomycin. vanc trough was ordered for 5/31 afternoon, but vanc dose was given before level was drawn, resulting a falsely elevated level of 50. Pt's renal function has been stable, scr 1.65>1.43, est. crcl ~ 25 ml/min, low UOP recorded.  Repeat VT today is ~therapeutic at 14. Will resume previous vanc dose.  Plan: - Continue vancomycin 750 mg Q 24 h - Monitor clinical progress, c/s, renal function, abx plan/LOT - VT@SS  as indicated    Height: 5\' 5"  (165.1 cm) Weight: 150 lb 11.2 oz (68.357 kg) IBW/kg (Calculated) : 57  Temp (24hrs), Avg:98.6 F (37 C), Min:98.4 F (36.9 C), Max:98.8 F (37.1 C)   Recent Labs Lab 08/29/15 0430 08/30/15 0357 08/31/15 0543 09/01/15 0545 09/01/15 1404 09/02/15 0544 09/02/15 1201  WBC 21.8* 22.3* 26.1* 25.5*  --  25.2*  --   CREATININE 1.50* 1.59* 1.66* 1.65*  --  1.43*  --   VANCOTROUGH  --   --   --   --  50*  --  14    Estimated Creatinine Clearance: 25.4 mL/min (by C-G formula based on Cr of 1.43).    No Known Allergies  Antimicrobials this admission: 5/28 zosyn >> 5/29  5/27 vanc >>  5/25 cipro >> 5/27  5/25 flagyl >> 5/31  Dose adjustments this admission: 5/31: VT: 50 (drawn after RN gave vanc - hold vanc, check another VT) 5/25 urine - neg 6/1 VT: 14 (~therapeutic, resume 750mg  IV q24h)  Microbiology results: 5/25 urine cx: negative 5/27 cdiff: negative  Elicia Lamp, PharmD, BCPS Clinical Pharmacist Pager [phone] 09/02/2015 1:46 PM

## 2015-09-02 NOTE — Progress Notes (Signed)
Physical Therapy Treatment Patient Details Name: Shannon [name] MRN: [id number] DOB: 1923/04/26 Today's Date: 09/02/2015    History of Present Illness 82 yo female admitted with N/V, MRI + osteomyelitis toe of R foot. hx of HTN, Afib, MI, ventral hernia, CVA, DM, COPD, arhtritis, PVD, neuropathy.    PT Comments    Patient is progressing toward mobility goals and demo'd increased activity tolerance this session. Continue to progress as tolerated with anticipated d/c home with HHPT and assistance from daughter.    Follow Up Recommendations  Home health PT;Supervision/Assistance - 24 hour     Equipment Recommendations  None recommended by PT    Recommendations for Other Services       Precautions / Restrictions Precautions Precautions: Fall Precaution Comments: NG tube Restrictions Weight Bearing Restrictions: No    Mobility  Bed Mobility Overal bed mobility: Needs Assistance Bed Mobility: Sit to Supine       Sit to supine: Min guard   General bed mobility comments: cues for technique to scoot in bed and increased time  Transfers Overall transfer level: Needs assistance Equipment used: Rolling walker (2 wheeled) Transfers: Sit to/from Stand Sit to Stand: Min guard;Min assist         General transfer comment: min guard from recliner and onto EOB and min A to power up from commode with pt using R grab bar; cues for safe hand placement  Ambulation/Gait Ambulation/Gait assistance: Supervision Ambulation Distance (Feet): 110 Feet Assistive device: Rolling walker (2 wheeled) Gait Pattern/deviations: Step-through pattern;Decreased stride length;Trunk flexed     General Gait Details: cues for position of RW, cadence, and posture   Stairs            Wheelchair Mobility    Modified Rankin (Stroke Patients Only)       Balance Overall balance assessment: Needs assistance Sitting-balance support: No upper extremity supported;Feet supported Sitting  balance-Leahy Scale: Good     Standing balance support: Bilateral upper extremity supported;During functional activity Standing balance-Leahy Scale: Fair                      Cognition Arousal/Alertness: Awake/alert Behavior During Therapy: WFL for tasks assessed/performed Overall Cognitive Status: Within Functional Limits for tasks assessed                      Exercises Other Exercises Other Exercises: bridging X 5    General Comments        Pertinent Vitals/Pain Pain Assessment: No/denies pain    Home Living                      Prior Function            PT Goals (current goals can now be found in the care plan section) Acute Rehab PT Goals Patient Stated Goal: to go home Progress towards PT goals: Progressing toward goals    Frequency  Min 3X/week    PT Plan Current plan remains appropriate    Co-evaluation             End of Session Equipment Utilized During Treatment: Gait belt Activity Tolerance: Patient tolerated treatment well Patient left: in bed;with call bell/phone within reach;with bed alarm set     Time: 0865-7846 PT Time Calculation (min) (ACUTE ONLY): 30 min  Charges:  $Gait Training: 8-22 mins $Therapeutic Activity: 8-22 mins  G Codes:      Derek Mound, PTA Pager: [phone]   09/02/2015, 4:55 PM

## 2015-09-03 ENCOUNTER — Ambulatory Visit: Payer: Medicare Other | Admitting: Podiatry

## 2015-09-03 ENCOUNTER — Other Ambulatory Visit: Payer: Self-pay

## 2015-09-03 DIAGNOSIS — E11621 Type 2 diabetes mellitus with foot ulcer: Secondary | ICD-10-CM

## 2015-09-03 DIAGNOSIS — L97513 Non-pressure chronic ulcer of other part of right foot with necrosis of muscle: Secondary | ICD-10-CM

## 2015-09-03 DIAGNOSIS — I4891 Unspecified atrial fibrillation: Secondary | ICD-10-CM

## 2015-09-03 LAB — BASIC METABOLIC PANEL
Anion gap: 9 (ref 5–15)
BUN: 6 mg/dL (ref 6–20)
CO2: 24 mmol/L (ref 22–32)
Calcium: 9 mg/dL (ref 8.9–10.3)
Chloride: 101 mmol/L (ref 101–111)
Creatinine, Ser: 1.32 mg/dL — ABNORMAL HIGH (ref 0.44–1.00)
GFR calc Af Amer: 40 mL/min — ABNORMAL LOW (ref >60)
GFR calc non Af Amer: 34 mL/min — ABNORMAL LOW (ref >60)
Glucose, Bld: 111 mg/dL — ABNORMAL HIGH (ref 65–99)
Potassium: 3.3 mmol/L — ABNORMAL LOW (ref 3.5–5.1)
Sodium: 134 mmol/L — ABNORMAL LOW (ref 135–145)

## 2015-09-03 LAB — CBC
HCT: 31.2 % — ABNORMAL LOW (ref 36.0–46.0)
Hemoglobin: 10.5 g/dL — ABNORMAL LOW (ref 12.0–15.0)
MCH: 31.3 pg (ref 26.0–34.0)
MCHC: 33.7 g/dL (ref 30.0–36.0)
MCV: 92.9 fL (ref 78.0–100.0)
Platelets: 280 10*3/uL (ref 150–400)
RBC: 3.36 MIL/uL — ABNORMAL LOW (ref 3.87–5.11)
RDW: 18.7 % — ABNORMAL HIGH (ref 11.5–15.5)
WBC: 23.4 10*3/uL — ABNORMAL HIGH (ref 4.0–10.5)

## 2015-09-03 LAB — MAGNESIUM: Magnesium: 1.8 mg/dL (ref 1.7–2.4)

## 2015-09-03 LAB — PATHOLOGIST SMEAR REVIEW

## 2015-09-03 LAB — GLUCOSE, CAPILLARY
Glucose-Capillary: 135 mg/dL — ABNORMAL HIGH (ref 65–99)
Glucose-Capillary: 106 mg/dL — ABNORMAL HIGH (ref 65–99)
Glucose-Capillary: 120 mg/dL — ABNORMAL HIGH (ref 65–99)
Glucose-Capillary: 140 mg/dL — ABNORMAL HIGH (ref 65–99)
Glucose-Capillary: 109 mg/dL — ABNORMAL HIGH (ref 65–99)

## 2015-09-03 LAB — HEPARIN LEVEL (UNFRACTIONATED): Heparin Unfractionated: 0.39 IU/mL (ref 0.30–0.70)

## 2015-09-03 MED ORDER — LEVOTHYROXINE SODIUM 88 MCG PO TABS
88.0000 ug | ORAL_TABLET | Freq: Every day | ORAL | Status: DC
  Administered 2015-09-04 – 2015-09-05 (×2): 88 ug via ORAL
  Filled 2015-09-03 (×2): qty 1

## 2015-09-03 MED ORDER — AMOXICILLIN-POT CLAVULANATE 500-125 MG PO TABS
1.0000 | ORAL_TABLET | ORAL | Status: DC
  Administered 2015-09-03 – 2015-09-04 (×2): 500 mg via ORAL
  Filled 2015-09-03 (×2): qty 1

## 2015-09-03 MED ORDER — POTASSIUM CHLORIDE 10 MEQ/100ML IV SOLN
10.0000 meq | INTRAVENOUS | Status: AC
  Administered 2015-09-03 (×4): 10 meq via INTRAVENOUS
  Filled 2015-09-03 (×4): qty 100

## 2015-09-03 MED ORDER — APIXABAN 2.5 MG PO TABS
2.5000 mg | ORAL_TABLET | Freq: Two times a day (BID) | ORAL | Status: DC
  Administered 2015-09-03 – 2015-09-05 (×5): 2.5 mg via ORAL
  Filled 2015-09-03 (×5): qty 1

## 2015-09-03 MED ORDER — METOCLOPRAMIDE HCL 10 MG PO TABS
5.0000 mg | ORAL_TABLET | Freq: Three times a day (TID) | ORAL | Status: DC
  Administered 2015-09-03 – 2015-09-05 (×8): 5 mg via ORAL
  Filled 2015-09-03 (×7): qty 1

## 2015-09-03 MED ORDER — ATORVASTATIN CALCIUM 10 MG PO TABS
ORAL_TABLET | ORAL | Status: DC
Start: 2015-09-03 — End: 2015-11-18

## 2015-09-03 MED ORDER — SERTRALINE HCL 100 MG PO TABS
ORAL_TABLET | ORAL | Status: DC
Start: 2015-09-03 — End: 2016-04-10

## 2015-09-03 NOTE — Telephone Encounter (Signed)
Fax received from [facility] requesting a refill for atorvastatin 10 mg tablets. Rx was sent to pharmacy electronically.

## 2015-09-03 NOTE — Telephone Encounter (Signed)
Received a fax from [facility] requesting refill for sertraline 100mg  tablets. Refill was sent electronically to [facility] [phone]

## 2015-09-03 NOTE — Progress Notes (Signed)
[facility]                                                                              Patient Demographics  Shannon [name], is a 82 y.o. female, DOB - [birth year], ZOX:[id number]  Admit date - 08/26/2015   Admitting Physician Joseph Art, DO  Outpatient Primary MD for the patient is Kirt Boys, DO  Outpatient specialists:   LOS - 8  days    Chief Complaint  Patient presents with  . Abdominal Pain       Brief summary   Shannon [name] is a 82 y.o. female with and extensive past medical and surgical history . She's had multiple intestinal surgeries as listed below. Patient known to [facility]. Patient developed nausea several days ago then yesterday began vomiting. She has minor diarrhea and lower abdominal pain a couple of days ago. She just completed antibiotics for toe ulcer.  NG tube placed for Gastric outlet, being clamped and trial of clears.  Patient has refused amputation of toe with osteomyelitis.  Continue IV abx.  Palliative care following   Assessment & Plan   Intractable Nausea and vomiting: Patient has long-standing GI and surgical history including a sigmoidectomy, remote gastro-jejunostomy and suspected vagotomy, resection of gastrojejunal anastomosis 2015 at [facility]. She has a history of gastroparesis with gastric bezoar formation, history of severe candida esophagitis, SBO requiring exp lap and LOA Oct 2016. Known to Dr. Marina Goodell with [facility].  - Last EGD for dysphagia in 4/16 showed diffuse candidiasis in the entire esophagus, gastric stenosis at the pylorus, normal duodenum  - No SBO on CT abdomen, Completed IV Flagyl, C. difficile negative - Continue IV PPI, on scheduled Reglan, continue Zofran - NGT clamped, trial of clears - Potassium replacement, keep K ~4  Diabetic ulcer, right third toe with gangrene  - Patient follows wound care Center, had completed a course of doxycycline prior to admission - Patient was seen by orthopedics, Dr. Victorino Dike and  vascular surgery, Dr. early both recommended amputation of the right third toe, patient declined.Per vascular surgery, would not recommend any arteriogram for further evaluation. - X-rays of the foot suggestive of osteomyelitis of the right third toe. MRI of the foot showed osteomyelitis of the phalanges of the third toe involving the middle and distal phalanges - Psychiatry was consulted, patient has capacity to make decisions. -change to oral antibiotics for 4 weeks at the time of discharge-- was on doxy-- augmentin PO - Leukocytosis, WBCs 25 today, palliative care consult for goals of care -discussed the possibility of sepsis and worsening of condition without amputation/definitive treatment -outpatient wound care follow up  Asthma - continue duo nebs  Diabetes, type II, diet controlled - A1c 6.5  Chronic diastolic CHF, on imdur, lasix 20mg  daily.  - Creatinine is improving  Hypertension uncontrolled - restart Imdur, hydralazine  Acute on CKD, stage 3. baseline 1.3  - Creatinine close to baseline, continue to hold Lasix  Hypothyroidism. TSH 08/02/15 was 83.  -Change home oral Synthroid to IV, TSH 84.8 -Increase Synthroid to 88 MCG - Follow up TSH in 4 weeks outpatient  Constipation, on miralax and senokot,  lactulose. -Dulcolax suppositories as needed  Depression. Stable.  - NPO  GERD. On PPI at home - IV PPI  Code Status: Partial code  DVT Prophylaxis: On heparin drip   Family Communication: called daughter  6/1  Disposition Plan: Once tolerating diet  Time Spent in minutes  25 minutes  Procedures:  CT abdomen  Consultants:   GI Orthopedics Vascular surgery  Antimicrobials:   IV ciprofloxacin 5/25 -526  IV Flagyl 5/25   Medications  Scheduled Meds: . amiodarone  100 mg Oral Daily  . amLODipine  10 mg Oral Daily  . amoxicillin-clavulanate  1 tablet Oral Q24H  . antiseptic oral rinse  7 mL Mouth Rinse q12n4p  . atorvastatin  10 mg Oral q1800   . calcium-vitamin D  1 tablet Oral Q breakfast  . chlorhexidine  15 mL Mouth Rinse BID  . cycloSPORINE  1 drop Both Eyes BID  . dextrose  1 Tube Oral Once  . feeding supplement  1 Container Oral TID BM  . fluticasone  2 spray Each Nare Daily  . gabapentin  300 mg Oral BID  . hydrALAZINE  50 mg Oral TID  . isosorbide mononitrate  30 mg Oral Daily  . latanoprost  1 drop Both Eyes QHS  . [START ON 09/04/2015] levothyroxine  88 mcg Oral QAC breakfast  . metoCLOPramide (REGLAN) injection  10 mg Intravenous TID  . pantoprazole  40 mg Oral Q0600  . polyethylene glycol  17 g Oral Daily  . polyvinyl alcohol  1 drop Both Eyes QID  . potassium chloride  10 mEq Intravenous Q1 Hr x 4  . prochlorperazine  5 mg Intravenous Once  . sertraline  100 mg Oral Daily  . simethicone  80 mg Oral QID  . vancomycin  750 mg Intravenous Q24H   Continuous Infusions: . heparin 800 Units/hr (09/02/15 2340)   PRN Meds:.acetaminophen **OR** acetaminophen, bisacodyl, hydrALAZINE, ipratropium-albuterol, lactulose, ondansetron **OR** ondansetron (ZOFRAN) IV   Antibiotics   Anti-infectives    Start     Dose/Rate Route Frequency Ordered Stop   09/03/15 1300  amoxicillin-clavulanate (AUGMENTIN) 500-125 MG per tablet 500 mg     1 tablet Oral Every 24 hours 09/03/15 1158     09/02/15 2030  vancomycin (VANCOCIN) IVPB 750 mg/150 ml premix     750 mg 150 mL/hr over 60 Minutes Intravenous Every 24 hours 09/02/15 1854     08/30/15 1200  vancomycin (VANCOCIN) IVPB 1000 mg/200 mL premix  Status:  Discontinued     1,000 mg 200 mL/hr over 60 Minutes Intravenous Every 48 hours 08/28/15 1135 08/29/15 0911   08/29/15 1400  vancomycin (VANCOCIN) IVPB 750 mg/150 ml premix  Status:  Discontinued     750 mg 150 mL/hr over 60 Minutes Intravenous Every 24 hours 08/29/15 0911 09/02/15 1854   08/29/15 1000  piperacillin-tazobactam (ZOSYN) IVPB 3.375 g  Status:  Discontinued     3.375 g 12.5 mL/hr over 240 Minutes Intravenous Every 8  hours 08/29/15 0909 08/30/15 1343   08/28/15 1200  vancomycin (VANCOCIN) 1,250 mg in sodium chloride 0.9 % 250 mL IVPB     1,250 mg 166.7 mL/hr over 90 Minutes Intravenous  Once 08/28/15 1124 08/28/15 1605   08/27/15 1100  ciprofloxacin (CIPRO) IVPB 400 mg  Status:  Discontinued     400 mg 200 mL/hr over 60 Minutes Intravenous Every 24 hours 08/26/15 1648 08/27/15 1414   08/26/15 2300  ciprofloxacin (CIPRO) IVPB 400 mg  Status:  Discontinued  400 mg 200 mL/hr over 60 Minutes Intravenous Every 12 hours 08/26/15 1646 08/26/15 1648   08/26/15 2000  metroNIDAZOLE (FLAGYL) IVPB 500 mg  Status:  Discontinued     500 mg 100 mL/hr over 60 Minutes Intravenous Every 8 hours 08/26/15 1646 09/01/15 1144   08/26/15 1600  metroNIDAZOLE (FLAGYL) IVPB 500 mg  Status:  Discontinued     500 mg 100 mL/hr over 60 Minutes Intravenous Every 8 hours 08/26/15 1559 08/26/15 1645   08/26/15 1600  ciprofloxacin (CIPRO) IVPB 400 mg  Status:  Discontinued     400 mg 200 mL/hr over 60 Minutes Intravenous Every 12 hours 08/26/15 1559 08/26/15 1646   08/26/15 1130  ciprofloxacin (CIPRO) IVPB 400 mg     400 mg 200 mL/hr over 60 Minutes Intravenous  Once 08/26/15 1119 08/26/15 1311   08/26/15 1130  metroNIDAZOLE (FLAGYL) IVPB 500 mg     500 mg 100 mL/hr over 60 Minutes Intravenous  Once 08/26/15 1119 08/26/15 1311        Subjective:   Still doing well  Objective:   Filed Vitals:   09/02/15 1350 09/02/15 2111 09/02/15 2316 09/03/15 0525  BP: 128/62  155/66 171/90  Pulse: 78  80 109  Temp: 98 F (36.7 C)  98.4 F (36.9 C) 97.8 F (36.6 C)  TempSrc:   Oral Oral  Resp: 18  18 20   Height:      Weight:      SpO2: 100% 92% 99% 100%    Intake/Output Summary (Last 24 hours) at 09/03/15 1213 Last data filed at 09/03/15 0700  Gross per 24 hour  Intake    966 ml  Output      0 ml  Net    966 ml     Wt Readings from Last 3 Encounters:  08/26/15 68.357 kg (150 lb 11.2 oz)  08/10/15 67.495 kg (148 lb  12.8 oz)  08/06/15 66.951 kg (147 lb 9.6 oz)     Exam  General: Alert and oriented x 3, NAD  Cardiovascular: S1 S2 clear  Respiratory: CTAB  Gastrointestinal: Soft, NT, ND, NBS  Ext: no cyanosis clubbing or edema, Right third toe- wrapped  Psych: Normal affect and demeanor, alert and oriented x3    Data Reviewed:  I have personally reviewed following labs and imaging studies  Micro Results Recent Results (from the past 240 hour(s))  Urine culture     Status: None   Collection Time: 08/26/15  7:37 AM  Result Value Ref Range Status   Specimen Description URINE, CATHETERIZED  Final   Special Requests NONE  Final   Culture NO GROWTH  Final   Report Status 08/27/2015 FINAL  Final  C difficile quick scan w PCR reflex     Status: None   Collection Time: 08/28/15  6:14 AM  Result Value Ref Range Status   C Diff antigen NEGATIVE NEGATIVE Final   C Diff toxin NEGATIVE NEGATIVE Final   C Diff interpretation Negative for toxigenic C. difficile  Final    Radiology Reports Ct Abdomen Pelvis Wo Contrast  08/26/2015  CLINICAL DATA:  Left upper quadrant pain with nausea and vomiting for 1 week. EXAM: CT ABDOMEN AND PELVIS WITHOUT CONTRAST TECHNIQUE: Multidetector CT imaging of the abdomen and pelvis was performed following the standard protocol without IV contrast. COMPARISON:  07/05/2015 FINDINGS: Lower chest and abdominal wall: Extensive ventral abdominal wall scarring with a fatty hernia left of midline, likely incisional. There is fluid within the thick walled  lower esophagus which contains refluxed or non cleared fluid. Eventration of the right diaphragm. Hepatobiliary: Multiple hepatic low densities fairly circumscribed and likely cysts.Cholecystectomy. Pancreas: Unremarkable. Spleen: Unremarkable. Adrenals/Urinary Tract: Negative adrenals. No hydronephrosis or stone. Bladder wall thickening. Very Peristranding could be related to generalized edema or cystitis. Reproductive:No  pathologic findings. Stomach/Bowel: The stomach is circumferentially thick walled and moderately distended. The antral region is particularly thickened and collapsed. Suture along the lower gastric body. No small bowel dilatation. There is a proximal enteroenteric anastomosis. The proximal colon shows wall thickening and pericolonic edema, greatest along the proximal transverse segment. Colonic inflammatory changes are fairly extensive, more compatible with colitis and diverticulitis. No pneumatosis or pneumoperitoneum. Scattered colonic and ileal diverticulosis. Possible previous sigmoidectomy. Vascular/Lymphatic: No acute vascular Find.  No mass or adenopathy. Peritoneal: No ascites or pneumoperitoneum. Musculoskeletal: No acute abnormalities. Remote appearing T11 superior endplate compression fracture. Lumbar facet arthropathy with mild L5-S1 anterolisthesis. IMPRESSION: 1. Moderately distended stomach with thickened antral region. Patient has history of chronic impaired gastric emptying and recent EGD. 2. Signs of gastritis and esophagitis. 3. Colitis with nonspecific pattern. 4. Cystitis, correlate with urinalysis. Electronically Signed   By: Marnee Spring M.D.   On: 08/26/2015 09:24   Dg Chest 2 View  08/26/2015  CLINICAL DATA:  Abdominal pain, nausea and vomiting. Gastroesophageal reflux. EXAM: CHEST  2 VIEW COMPARISON:  08/02/2015 and chest CT dated 07/07/2015. FINDINGS: Borderline enlarged cardiac silhouette with an interval decrease in size. Linear density at both lung bases. Mildly prominent pulmonary vasculature. Mild diffuse peribronchial thickening. Stable right diaphragmatic eventration. Atheromatous arterial calcifications. Upper abdominal surgical clips. Diffuse osteopenia. Bilateral shoulder degenerative changes. IMPRESSION: 1. Mildly improved left basilar linear atelectasis and possible scarring. 2. Interval mild right basilar linear atelectasis. 3. Improved cardiomegaly. 4. Mild pulmonary  vascular congestion. 5. Mild bronchitic changes. Electronically Signed   By: Beckie Salts M.D.   On: 08/26/2015 07:22   Mr Foot Right Wo Contrast  08/29/2015  CLINICAL DATA:  Right third toe diabetic ulcer. Necrosis of muscle. Fragmentation of the tuft of the distal phalanx third toe. Query osteomyelitis. EXAM: MRI OF THE RIGHT FOREFOOT WITHOUT CONTRAST TECHNIQUE: Multiplanar, multisequence MR imaging was performed. No intravenous contrast was administered. COMPARISON:  08/27/2015 FINDINGS: Severe edema signal and poor cortical definition in the middle and distal phalanges of the third toe compatible with acute osteomyelitis. These 2 phalanges might be fused. There is also osteomyelitis involving the distal head of the proximal phalanx of the third toe. Low-level edema in the shaft of the proximal phalanx second toe is noted but may be reactive, and is not as fulminant as I would expect for osteomyelitis. Lisfranc ligament intact. There is considerable degenerative arthropathy at the Lisfranc joint. Hallux valgus noted with bony bunion and degenerative spurring at the first MTP joint. The second digit is less flexed than the other digits, and accordingly is dorsally positioned. Abnormal subcutaneous edema noted along the dorsal and plantar forefoot. There is low-level edema tracking along the plantar musculature of the foot. Patchy degenerative edema at the calcaneocuboid articulation is seen on the coronal images. Small degenerative subcortical cystic lesion in the navicular. IMPRESSION: 1. Osteomyelitis of the phalanges of the third toe, more severely involving the middle and distal phalanges, but also with some distal involvement of the proximal phalanx. Surrounding cellulitis in the toe and also tracking in surrounding toes, the dorsum of the foot, and plantar foot. 2. Edema tracks along the plantar musculature of the foot and myositis is  not excluded. 3. Degenerative arthropathy of the Lisfranc joint and  first MTP joint. Hallux valgus and bony bunion noted. 4. Subtle edema in the proximal phalanx of the second toe, favor reactive etiology rather than osteomyelitis of the second toe. Electronically Signed   By: Gaylyn Rong M.D.   On: 08/29/2015 11:25   Dg Foot 2 Views Right  08/19/2015  AP and lateral xrays right foot were taken. Midfoot arthritis noted.  Hammer toe digit.  HAV deformity 1st MPJ.  There is white bone in third toe with no evidence of cystic formation.  No fracture dislocation or tumor noted.  Bone density appears normal for patients age and sex.  Joint space appears normal.  No frank deformity.   Helane Gunther DPM  Dg Foot Complete Right  08/27/2015  CLINICAL DATA:  Diabetic ulcer right foot, muscle necrosis, please evaluate bony changes at distal phalanx right third toe EXAM: RIGHT FOOT COMPLETE - 3+ VIEW COMPARISON:  08/17/2015 and 08/26/2014 FINDINGS: Three views of the right foot submitted. No acute fracture or subluxation. Again noted hallux valgus deformity. There is diffuse osteopenia. Small plantar spur of calcaneus. Atherosclerotic vascular calcifications are noted. There is dorsal spurring tarsal metatarsal joint. Significant soft tissue swelling noted right third toe. There is osteo lysis and some bony fragmentation at the tip of distal phalanx third third toe. Osteomyelitis cannot be excluded. Further correlation with MRI is recommended. IMPRESSION: No acute fracture or subluxation. Again noted hallux valgus deformity. There is diffuse osteopenia. Small plantar spur of calcaneus. Atherosclerotic vascular calcifications are noted. There is dorsal spurring tarsal metatarsal joint. Significant soft tissue swelling noted right third toe. There is osteo lysis and some bony fragmentation at the tip of distal phalanx third third toe. Osteomyelitis cannot be excluded. Further correlation with MRI is recommended. Electronically Signed   By: Natasha Mead M.D.   On: 08/27/2015 16:34     Lab Data:  CBC:  Recent Labs Lab 08/30/15 0357 08/31/15 0543 09/01/15 0545 09/02/15 0544 09/02/15 0932 09/03/15 0557  WBC 22.3* 26.1* 25.5* 25.2*  --  23.4*  NEUTROABS  --   --   --   --  5.3  --   HGB 9.5* 11.0* 9.9* 10.2*  --  10.5*  HCT 28.4* 32.9* 30.0* 30.3*  --  31.2*  MCV 94.4 94.8 94.0 93.2  --  92.9  PLT 275 290 264 270  --  280   Basic Metabolic Panel:  Recent Labs Lab 08/30/15 0357 08/31/15 0543 09/01/15 0545 09/02/15 0544 09/03/15 0557  NA 139 139 137 135 134*  K 3.7 3.2* 3.5 3.3* 3.3*  CL 108 105 106 104 101  CO2 23 23 22 25 24   GLUCOSE 104* 73 92 120* 111*  BUN 10 11 12 8 6   CREATININE 1.59* 1.66* 1.65* 1.43* 1.32*  CALCIUM 8.8* 8.9 8.7* 8.6* 9.0  MG  --   --  1.5*  --  1.8   GFR: Estimated Creatinine Clearance: 27.5 mL/min (by C-G formula based on Cr of 1.32). Liver Function Tests: No results for input(s): AST, ALT, ALKPHOS, BILITOT, PROT, ALBUMIN in the last 168 hours. No results for input(s): LIPASE, AMYLASE in the last 168 hours. No results for input(s): AMMONIA in the last 168 hours. Coagulation Profile: No results for input(s): INR, PROTIME in the last 168 hours. Cardiac Enzymes: No results for input(s): CKTOTAL, CKMB, CKMBINDEX, TROPONINI in the last 168 hours. BNP (last 3 results) No results for input(s): PROBNP in the last 8760  hours. HbA1C: No results for input(s): HGBA1C in the last 72 hours. CBG:  Recent Labs Lab 09/02/15 0751 09/02/15 1148 09/02/15 1655 09/02/15 2318 09/03/15 0806  GLUCAP 124* 199* 122* 135* 106*   Lipid Profile: No results for input(s): CHOL, HDL, LDLCALC, TRIG, CHOLHDL, LDLDIRECT in the last 72 hours. Thyroid Function Tests: No results for input(s): TSH, T4TOTAL, FREET4, T3FREE, THYROIDAB in the last 72 hours. Anemia Panel: No results for input(s): VITAMINB12, FOLATE, FERRITIN, TIBC, IRON, RETICCTPCT in the last 72 hours. Urine analysis:    Component Value Date/Time   COLORURINE YELLOW  08/26/2015 0737   APPEARANCEUR CLEAR 08/26/2015 0737   LABSPEC 1.022 08/26/2015 0737   PHURINE 6.5 08/26/2015 0737   GLUCOSEU NEGATIVE 08/26/2015 0737   HGBUR NEGATIVE 08/26/2015 0737   BILIRUBINUR NEGATIVE 08/26/2015 0737   KETONESUR 15* 08/26/2015 0737   PROTEINUR 100* 08/26/2015 0737   UROBILINOGEN 0.2 01/27/2015 2308   NITRITE NEGATIVE 08/26/2015 0737   LEUKOCYTESUR NEGATIVE 08/26/2015 0737     Shannon [name] U Adrena Nakamura DO. [facility] 09/03/2015, 12:13 PM  Pager: [phone]   After 7pm go to [email or web address] - password [email or web address]  Call night coverage person covering after 7pm

## 2015-09-03 NOTE — Progress Notes (Signed)
Daily Rounding Note  09/03/2015, 11:41 AM  LOS: 8 days   SUBJECTIVE:       NGT removed and placed on bariatric full liquid diet last evening.  She has not had recurrent n/v.  Having soft BMs.  Heparin d/c'd, Abixiban to restart.   OBJECTIVE:         Vital signs in last 24 hours:    Temp:  [97.8 F (36.6 C)-98.4 F (36.9 C)] 97.8 F (36.6 C) (06/02 0525) Pulse Rate:  [78-109] 109 (06/02 0525) Resp:  [18-20] 20 (06/02 0525) BP: (128-171)/(62-90) 171/90 mmHg (06/02 0525) SpO2:  [92 %-100 %] 100 % (06/02 0525) Last BM Date: 09/02/15 Filed Weights   08/26/15 1600 08/26/15 1649  Weight: 67.5 kg (148 lb 13 oz) 68.357 kg (150 lb 11.2 oz)   General: looks well   Heart: RRR Chest: clear bil Abdomen: soft, NT, hypoactive BS.  ND  Extremities: no CCE Neuro/Psych:  Oriented x 3.  Alert.  Pleasant and calm as always.   Intake/Output from previous day: 06/01 0701 - 06/02 0700 In: 966 [P.O.:720; I.V.:96; IV Piggyback:150] Out: -   Intake/Output this shift:    Lab Results:  Recent Labs  09/01/15 0545 09/02/15 0544 09/03/15 0557  WBC 25.5* 25.2* 23.4*  HGB 9.9* 10.2* 10.5*  HCT 30.0* 30.3* 31.2*  PLT 264 270 280   BMET  Recent Labs  09/01/15 0545 09/02/15 0544 09/03/15 0557  NA 137 135 134*  K 3.5 3.3* 3.3*  CL 106 104 101  CO2 22 25 24   GLUCOSE 92 120* 111*  BUN 12 8 6   CREATININE 1.65* 1.43* 1.32*  CALCIUM 8.7* 8.6* 9.0   Studies/Results: No results found.   Scheduled Meds: . amiodarone  100 mg Oral Daily  . amLODipine  10 mg Oral Daily  . amoxicillin-clavulanate  1 tablet Oral Q24H  . antiseptic oral rinse  7 mL Mouth Rinse q12n4p  . apixaban  2.5 mg Oral BID  . atorvastatin  10 mg Oral q1800  . calcium-vitamin D  1 tablet Oral Q breakfast  . chlorhexidine  15 mL Mouth Rinse BID  . cycloSPORINE  1 drop Both Eyes BID  . dextrose  1 Tube Oral Once  . feeding supplement  1 Container Oral TID BM   . fluticasone  2 spray Each Nare Daily  . gabapentin  300 mg Oral BID  . hydrALAZINE  50 mg Oral TID  . isosorbide mononitrate  30 mg Oral Daily  . latanoprost  1 drop Both Eyes QHS  . [START ON 09/04/2015] levothyroxine  88 mcg Oral QAC breakfast  . metoCLOPramide  5 mg Oral TID AC & HS  . pantoprazole  40 mg Oral Q0600  . polyethylene glycol  17 g Oral Daily  . polyvinyl alcohol  1 drop Both Eyes QID  . potassium chloride  10 mEq Intravenous Q1 Hr x 4  . prochlorperazine  5 mg Intravenous Once  . sertraline  100 mg Oral Daily  . simethicone  80 mg Oral QID   Continuous Infusions:  PRN Meds:.acetaminophen **OR** acetaminophen, bisacodyl, hydrALAZINE, ipratropium-albuterol, lactulose, ondansetron **OR** ondansetron (ZOFRAN) IV   ASSESMENT:   * Acute, recurrent flare of N/V in pt s/p multiple abd surgeries, hx SBOs, pyloric stenosis, functional GOO.  Pt doing well.  Tolerating full liquids.  Sxs persist despite BID IV PPI, BID Zofran.  .  NGT placed 5/29. Clamped 5/31. Removed 6/1.  BID Reglan  added 5/30, changed to TID 5/31.  Meds could also be contributing esp the Zoloft and the Amiodarone.  * Osteomyelitis, gangrene in toe. Augmentin to start today, replacing IV vanc  Refuses suggested amputation.   * Afib. Abixiban to restart.     PLAN   *  Switch to oral reglan, 5 mg po AC/HS.  Once daily Protonix.   Advance to goal diet of purees.  Concerned that Augmentin will cause GI upset.     Azucena Freed  09/03/2015, 11:41 AM Pager: [phone]     Attending physician's note   I have taken an interval history, reviewed the chart and examined the patient. I agree with the Advanced Practitioner's note, impression and recommendations. Gastroparesis, functional GOO exacerbation has resolved. Max diet is pureed foods. Continue Protonix 40 mg qd and Reglan 5 mg as and hs. GI signing off. Outpatient GI follow up with Dr. Scarlette Shorts.   Lucio Edward, MD [facility] [phone]  Mon-Fri 8a-5p [phone] after 5p, weekends, holidays

## 2015-09-03 NOTE — Progress Notes (Signed)
[facility] for heparin Indication: atrial fibrillation  No Known Allergies  Patient Measurements: Height: 5\' 5"  (165.1 cm) Weight: 150 lb 11.2 oz (68.357 kg) IBW/kg (Calculated) : 57 Heparin Dosing Weight: 68.4  Vital Signs: Temp: 97.8 F (36.6 C) (06/02 0525) Temp Source: Oral (06/02 0525) BP: 171/90 mmHg (06/02 0525) Pulse Rate: 109 (06/02 0525)  Labs:  Recent Labs  09/01/15 0545 09/02/15 0544 09/03/15 0557  HGB 9.9* 10.2* 10.5*  HCT 30.0* 30.3* 31.2*  PLT 264 270 280  HEPARINUNFRC 0.44 0.34 0.39  CREATININE 1.65* 1.43* 1.32*    Estimated Creatinine Clearance: 27.5 mL/min (by C-G formula based on Cr of 1.32).   Assessment: 82 yo woman admitted 08/26/2015 with abdominal pain Currently on IV heparin drip while her PTA Apixaban held.  She was on apixaban PTA for hx AFlutter. CHADSVASc at least 5.  Heparin level = 0.39, therapeutic on 800 units/hr. hgb 10.5 stable, pltc wnl. No bleeding noted per chart. Now to restart Apixaban and discontinue IV heparin drip.  She was on Apixaban 2. 5 mg po BID PTA.   Age 82 y.o , wt =68kg and SCr 1.32 (1.66>1.32)    Goal of Therapy:  Heparin level 0.3-0.7 units/ml Monitor platelets by anticoagulation protocol: Yes   Plan:  Discontinue heparin drip now and restart Apixaban 2.5 mg po BID.  Monitor s/sx bleeding   Nicole Cella, RPh Clinical Pharmacist Pager: [phone] 09/03/2015 12:27 PM

## 2015-09-03 NOTE — Care Management Important Message (Signed)
Important Message  Patient Details  Name: Shannon [name] MRN: [id number] Date of Birth: 1923-09-30   Medicare Important Message Given:  Yes    [name] 09/03/2015, 11:05 AM

## 2015-09-04 DIAGNOSIS — M869 Osteomyelitis, unspecified: Secondary | ICD-10-CM

## 2015-09-04 DIAGNOSIS — I10 Essential (primary) hypertension: Secondary | ICD-10-CM

## 2015-09-04 DIAGNOSIS — E1165 Type 2 diabetes mellitus with hyperglycemia: Secondary | ICD-10-CM

## 2015-09-04 DIAGNOSIS — E1151 Type 2 diabetes mellitus with diabetic peripheral angiopathy without gangrene: Secondary | ICD-10-CM

## 2015-09-04 DIAGNOSIS — E1169 Type 2 diabetes mellitus with other specified complication: Secondary | ICD-10-CM

## 2015-09-04 LAB — GLUCOSE, CAPILLARY
Glucose-Capillary: 97 mg/dL (ref 65–99)
Glucose-Capillary: 95 mg/dL (ref 65–99)
Glucose-Capillary: 103 mg/dL — ABNORMAL HIGH (ref 65–99)
Glucose-Capillary: 128 mg/dL — ABNORMAL HIGH (ref 65–99)

## 2015-09-04 MED ORDER — CARVEDILOL 3.125 MG PO TABS
3.1250 mg | ORAL_TABLET | Freq: Two times a day (BID) | ORAL | Status: DC
  Administered 2015-09-04 – 2015-09-05 (×2): 3.125 mg via ORAL
  Filled 2015-09-04 (×2): qty 1

## 2015-09-04 MED ORDER — AMOXICILLIN-POT CLAVULANATE 500-125 MG PO TABS
1.0000 | ORAL_TABLET | Freq: Two times a day (BID) | ORAL | Status: DC
  Administered 2015-09-04 – 2015-09-05 (×2): 500 mg via ORAL
  Filled 2015-09-04 (×3): qty 1

## 2015-09-04 NOTE — Progress Notes (Signed)
Patient ID: Shannon [name], female   DOB: 12/15/1923, 82 y.o.   MRN: [id number]                                                                PROGRESS NOTE                                                                                                                                                                                                             Patient Demographics:    Shannon [name], is a 82 y.o. female, DOB - 1923/04/04, [id number]  Admit date - 08/26/2015   Admitting Physician Joseph Art, DO  Outpatient Primary MD for the patient is Kirt Boys, DO  LOS - 9  Outpatient Specialists:  Chief Complaint  Patient presents with  . Abdominal Pain       Brief Narrative   Shannon [name] is a 82 y.o. female with and extensive past medical and surgical history . She's had multiple intestinal surgeries as listed below. Patient known to [facility]. Patient developed nausea several days ago then yesterday began vomiting. She has minor diarrhea and lower abdominal pain a couple of days ago. She just completed antibiotics for toe ulcer. NG tube placed for Gastric outlet, being clamped and trial of clears. Patient has refused amputation of toe with osteomyelitis. Continue IV abx. Palliative care following  Subjective:    Shannon [name] today has slight nausea,  Feels full easily when she eats.  Tolerating apparently augmentin.     Assessment  & Plan :    Principal Problem:   Adjustment disorder with anxiety Active Problems:   Essential hypertension, benign   DM (diabetes mellitus) type II uncontrolled, periph vascular disorder (HCC)   Gastric bezoar   Acute renal failure superimposed on stage 3 chronic kidney disease (HCC)   Colitis   Atrial fibrillation (HCC)   Diabetic ulcer of toe of right foot associated with type 2 diabetes mellitus, with necrosis of muscle (HCC)   Diverticulitis of large intestine without perforation or abscess without bleeding   Emesis   Diabetic  osteomyelitis (HCC)     Intractable Nausea and vomiting: Patient has long-standing GI and surgical history including a sigmoidectomy, remote gastro-jejunostomy and suspected vagotomy, resection of gastrojejunal anastomosis 2015 at [facility]. She has a history of gastroparesis  with gastric bezoar formation, history of severe candida esophagitis, SBO requiring exp lap and LOA Oct 2016. Known to Dr. Marina Goodell with [place].  - Last EGD for dysphagia in 4/16 showed diffuse candidiasis in the entire esophagus, gastric stenosis at the pylorus, normal duodenum  - No SBO on CT abdomen, Completed IV Flagyl, C. difficile negative - Continue IV PPI, on scheduled Reglan, continue Zofran - NGT out, , trial of clear liquids - Potassium replacement, keep K ~4  Diabetic ulcer, right third toe with gangrene  - Patient follows wound care Center, had completed a course of doxycycline prior to admission - Patient was seen by orthopedics, Dr. Victorino Dike and vascular surgery, Dr. early both recommended amputation of the right third toe, patient declined.Per vascular surgery, would not recommend any arteriogram for further evaluation. - X-rays of the foot suggestive of osteomyelitis of the right third toe. MRI of the foot showed osteomyelitis of the phalanges of the third toe involving the middle and distal phalanges - Psychiatry was consulted, patient has capacity to make decisions. -change to oral antibiotics for 4 weeks at the time of discharge-- was on doxy-- augmentin PO - Leukocytosis, WBCs 20's , palliative care consult for goals of care -discussed the possibility of sepsis and worsening of condition without amputation/definitive treatment -outpatient wound care follow up  Asthma - continue duo nebs  Diabetes, type II, diet controlled - A1c 6.5  Chronic diastolic CHF, on imdur, lasix 20mg  daily.  - Creatinine is improving  Hypertension uncontrolled - cont Imdur, hydralazine Start on carvedilol 3.125mg  po  bid  Acute on CKD, stage 3. baseline 1.3  - Creatinine close to baseline, continue to hold Lasix  Hypothyroidism. TSH 08/02/15 was 83.  TSH 84.8 -Increase Synthroid to 88 MCG - Follow up TSH in 4 weeks outpatient  Constipation, on miralax and senokot, lactulose. -Dulcolax suppositories as needed  Depression. Stable.  - advance diet as tolerated  GERD. stable  Code Status: Partial code  Family Communication: called daughter 6/1  Disposition Plan: Once tolerating diet  Time Spent in minutes 25 minutes  Antimicrobials:   IV ciprofloxacin 5/25 -526  IV Flagyl 5/25    Code Status : FULL CODE  Family Communication  : see above  Disposition Plan  : ?  Barriers For Discharge :   Consults  :  Orthopedics, vascular, GI  Procedures  :  CT scan  DVT Prophylaxis  :  Eliquis  Lab Results  Component Value Date   PLT 280 09/03/2015    Antibiotics  :  augmentin  Anti-infectives    Start     Dose/Rate Route Frequency Ordered Stop   09/03/15 1300  amoxicillin-clavulanate (AUGMENTIN) 500-125 MG per tablet 500 mg     1 tablet Oral Every 24 hours 09/03/15 1158     09/02/15 2030  vancomycin (VANCOCIN) IVPB 750 mg/150 ml premix  Status:  Discontinued     750 mg 150 mL/hr over 60 Minutes Intravenous Every 24 hours 09/02/15 1854 09/03/15 1219   08/30/15 1200  vancomycin (VANCOCIN) IVPB 1000 mg/200 mL premix  Status:  Discontinued     1,000 mg 200 mL/hr over 60 Minutes Intravenous Every 48 hours 08/28/15 1135 08/29/15 0911   08/29/15 1400  vancomycin (VANCOCIN) IVPB 750 mg/150 ml premix  Status:  Discontinued     750 mg 150 mL/hr over 60 Minutes Intravenous Every 24 hours 08/29/15 0911 09/02/15 1854   08/29/15 1000  piperacillin-tazobactam (ZOSYN) IVPB 3.375 g  Status:  Discontinued  3.375 g 12.5 mL/hr over 240 Minutes Intravenous Every 8 hours 08/29/15 0909 08/30/15 1343   08/28/15 1200  vancomycin (VANCOCIN) 1,250 mg in sodium chloride 0.9 % 250 mL IVPB      1,250 mg 166.7 mL/hr over 90 Minutes Intravenous  Once 08/28/15 1124 08/28/15 1605   08/27/15 1100  ciprofloxacin (CIPRO) IVPB 400 mg  Status:  Discontinued     400 mg 200 mL/hr over 60 Minutes Intravenous Every 24 hours 08/26/15 1648 08/27/15 1414   08/26/15 2300  ciprofloxacin (CIPRO) IVPB 400 mg  Status:  Discontinued     400 mg 200 mL/hr over 60 Minutes Intravenous Every 12 hours 08/26/15 1646 08/26/15 1648   08/26/15 2000  metroNIDAZOLE (FLAGYL) IVPB 500 mg  Status:  Discontinued     500 mg 100 mL/hr over 60 Minutes Intravenous Every 8 hours 08/26/15 1646 09/01/15 1144   08/26/15 1600  metroNIDAZOLE (FLAGYL) IVPB 500 mg  Status:  Discontinued     500 mg 100 mL/hr over 60 Minutes Intravenous Every 8 hours 08/26/15 1559 08/26/15 1645   08/26/15 1600  ciprofloxacin (CIPRO) IVPB 400 mg  Status:  Discontinued     400 mg 200 mL/hr over 60 Minutes Intravenous Every 12 hours 08/26/15 1559 08/26/15 1646   08/26/15 1130  ciprofloxacin (CIPRO) IVPB 400 mg     400 mg 200 mL/hr over 60 Minutes Intravenous  Once 08/26/15 1119 08/26/15 1311   08/26/15 1130  metroNIDAZOLE (FLAGYL) IVPB 500 mg     500 mg 100 mL/hr over 60 Minutes Intravenous  Once 08/26/15 1119 08/26/15 1311        Objective:   Filed Vitals:   09/03/15 0525 09/03/15 1440 09/03/15 2109 09/04/15 0533  BP: 171/90 171/78 164/77 178/87  Pulse: 109 83 79 111  Temp: 97.8 F (36.6 C) 98.2 F (36.8 C) 98.4 F (36.9 C) 99.1 F (37.3 C)  TempSrc: Oral Oral Oral Oral  Resp: 20 17 18 18   Height:      Weight:      SpO2: 100% 98% 96% 96%    Wt Readings from Last 3 Encounters:  08/26/15 68.357 kg (150 lb 11.2 oz)  08/10/15 67.495 kg (148 lb 12.8 oz)  08/06/15 66.951 kg (147 lb 9.6 oz)     Intake/Output Summary (Last 24 hours) at 09/04/15 1026 Last data filed at 09/04/15 1007  Gross per 24 hour  Intake   1280 ml  Output   1000 ml  Net    280 ml     Physical Exam  Awake Alert, Oriented X 3, No new F.N deficits, Normal  affect [place].AT,PERRAL Supple Neck,No JVD, No cervical lymphadenopathy appriciated.  Symmetrical Chest wall movement, Good air movement bilaterally, CTAB RRR,No Gallops,Rubs No Parasternal Heave +ve B.Sounds, Abd Soft, No tenderness, No organomegaly appriciated, No rebound - guarding or rigidity. No Cyanosis, Clubbing or edema, 3rd toe R foot slightly dark   Data Review:    CBC  Recent Labs Lab 08/30/15 0357 08/31/15 0543 09/01/15 0545 09/02/15 0544 09/02/15 0932 09/03/15 0557  WBC 22.3* 26.1* 25.5* 25.2*  --  23.4*  HGB 9.5* 11.0* 9.9* 10.2*  --  10.5*  HCT 28.4* 32.9* 30.0* 30.3*  --  31.2*  PLT 275 290 264 270  --  280  MCV 94.4 94.8 94.0 93.2  --  92.9  MCH 31.6 31.7 31.0 31.4  --  31.3  MCHC 33.5 33.4 33.0 33.7  --  33.7  RDW 18.2* 18.6* 18.8* 18.8*  --  18.7*  LYMPHSABS  --   --   --   --  2.1  --   MONOABS  --   --   --   --  0.8  --   EOSABS  --   --   --   --  17.2*  --   BASOSABS  --   --   --   --  0.1  --     Chemistries   Recent Labs Lab 08/30/15 0357 08/31/15 0543 09/01/15 0545 09/02/15 0544 09/03/15 0557  NA 139 139 137 135 134*  K 3.7 3.2* 3.5 3.3* 3.3*  CL 108 105 106 104 101  CO2 23 23 22 25 24   GLUCOSE 104* 73 92 120* 111*  BUN 10 11 12 8 6   CREATININE 1.59* 1.66* 1.65* 1.43* 1.32*  CALCIUM 8.8* 8.9 8.7* 8.6* 9.0  MG  --   --  1.5*  --  1.8   ------------------------------------------------------------------------------------------------------------------ No results for input(s): CHOL, HDL, LDLCALC, TRIG, CHOLHDL, LDLDIRECT in the last 72 hours.  Lab Results  Component Value Date   HGBA1C 6.5* 08/28/2015   ------------------------------------------------------------------------------------------------------------------ No results for input(s): TSH, T4TOTAL, T3FREE, THYROIDAB in the last 72 hours.  Invalid input(s):  FREET3 ------------------------------------------------------------------------------------------------------------------ No results for input(s): VITAMINB12, FOLATE, FERRITIN, TIBC, IRON, RETICCTPCT in the last 72 hours.  Coagulation profile No results for input(s): INR, PROTIME in the last 168 hours.  No results for input(s): DDIMER in the last 72 hours.  Cardiac Enzymes No results for input(s): CKMB, TROPONINI, MYOGLOBIN in the last 168 hours.  Invalid input(s): CK ------------------------------------------------------------------------------------------------------------------    Component Value Date/Time   BNP 39.0 08/26/2015 0630    Inpatient Medications  Scheduled Meds: . amiodarone  100 mg Oral Daily  . amLODipine  10 mg Oral Daily  . amoxicillin-clavulanate  1 tablet Oral Q24H  . antiseptic oral rinse  7 mL Mouth Rinse q12n4p  . apixaban  2.5 mg Oral BID  . atorvastatin  10 mg Oral q1800  . calcium-vitamin D  1 tablet Oral Q breakfast  . carvedilol  3.125 mg Oral BID WC  . chlorhexidine  15 mL Mouth Rinse BID  . cycloSPORINE  1 drop Both Eyes BID  . dextrose  1 Tube Oral Once  . feeding supplement  1 Container Oral TID BM  . fluticasone  2 spray Each Nare Daily  . gabapentin  300 mg Oral BID  . hydrALAZINE  50 mg Oral TID  . isosorbide mononitrate  30 mg Oral Daily  . latanoprost  1 drop Both Eyes QHS  . levothyroxine  88 mcg Oral QAC breakfast  . metoCLOPramide  5 mg Oral TID AC & HS  . pantoprazole  40 mg Oral Q0600  . polyethylene glycol  17 g Oral Daily  . polyvinyl alcohol  1 drop Both Eyes QID  . prochlorperazine  5 mg Intravenous Once  . sertraline  100 mg Oral Daily  . simethicone  80 mg Oral QID   Continuous Infusions:  PRN Meds:.acetaminophen **OR** acetaminophen, bisacodyl, hydrALAZINE, ipratropium-albuterol, lactulose, ondansetron **OR** ondansetron (ZOFRAN) IV  Micro Results Recent Results (from the past 240 hour(s))  Urine culture      Status: None   Collection Time: 08/26/15  7:37 AM  Result Value Ref Range Status   Specimen Description URINE, CATHETERIZED  Final   Special Requests NONE  Final   Culture NO GROWTH  Final   Report Status 08/27/2015 FINAL  Final  C difficile quick scan w  PCR reflex     Status: None   Collection Time: 08/28/15  6:14 AM  Result Value Ref Range Status   C Diff antigen NEGATIVE NEGATIVE Final   C Diff toxin NEGATIVE NEGATIVE Final   C Diff interpretation Negative for toxigenic C. difficile  Final    Radiology Reports Ct Abdomen Pelvis Wo Contrast  08/26/2015  CLINICAL DATA:  Left upper quadrant pain with nausea and vomiting for 1 week. EXAM: CT ABDOMEN AND PELVIS WITHOUT CONTRAST TECHNIQUE: Multidetector CT imaging of the abdomen and pelvis was performed following the standard protocol without IV contrast. COMPARISON:  07/05/2015 FINDINGS: Lower chest and abdominal wall: Extensive ventral abdominal wall scarring with a fatty hernia left of midline, likely incisional. There is fluid within the thick walled lower esophagus which contains refluxed or non cleared fluid. Eventration of the right diaphragm. Hepatobiliary: Multiple hepatic low densities fairly circumscribed and likely cysts.Cholecystectomy. Pancreas: Unremarkable. Spleen: Unremarkable. Adrenals/Urinary Tract: Negative adrenals. No hydronephrosis or stone. Bladder wall thickening. Very Peristranding could be related to generalized edema or cystitis. Reproductive:No pathologic findings. Stomach/Bowel: The stomach is circumferentially thick walled and moderately distended. The antral region is particularly thickened and collapsed. Suture along the lower gastric body. No small bowel dilatation. There is a proximal enteroenteric anastomosis. The proximal colon shows wall thickening and pericolonic edema, greatest along the proximal transverse segment. Colonic inflammatory changes are fairly extensive, more compatible with colitis and  diverticulitis. No pneumatosis or pneumoperitoneum. Scattered colonic and ileal diverticulosis. Possible previous sigmoidectomy. Vascular/Lymphatic: No acute vascular Find.  No mass or adenopathy. Peritoneal: No ascites or pneumoperitoneum. Musculoskeletal: No acute abnormalities. Remote appearing T11 superior endplate compression fracture. Lumbar facet arthropathy with mild L5-S1 anterolisthesis. IMPRESSION: 1. Moderately distended stomach with thickened antral region. Patient has history of chronic impaired gastric emptying and recent EGD. 2. Signs of gastritis and esophagitis. 3. Colitis with nonspecific pattern. 4. Cystitis, correlate with urinalysis. Electronically Signed   By: Marnee Spring M.D.   On: 08/26/2015 09:24   Dg Chest 2 View  08/26/2015  CLINICAL DATA:  Abdominal pain, nausea and vomiting. Gastroesophageal reflux. EXAM: CHEST  2 VIEW COMPARISON:  08/02/2015 and chest CT dated 07/07/2015. FINDINGS: Borderline enlarged cardiac silhouette with an interval decrease in size. Linear density at both lung bases. Mildly prominent pulmonary vasculature. Mild diffuse peribronchial thickening. Stable right diaphragmatic eventration. Atheromatous arterial calcifications. Upper abdominal surgical clips. Diffuse osteopenia. Bilateral shoulder degenerative changes. IMPRESSION: 1. Mildly improved left basilar linear atelectasis and possible scarring. 2. Interval mild right basilar linear atelectasis. 3. Improved cardiomegaly. 4. Mild pulmonary vascular congestion. 5. Mild bronchitic changes. Electronically Signed   By: Beckie Salts M.D.   On: 08/26/2015 07:22   Mr Foot Right Wo Contrast  08/29/2015  CLINICAL DATA:  Right third toe diabetic ulcer. Necrosis of muscle. Fragmentation of the tuft of the distal phalanx third toe. Query osteomyelitis. EXAM: MRI OF THE RIGHT FOREFOOT WITHOUT CONTRAST TECHNIQUE: Multiplanar, multisequence MR imaging was performed. No intravenous contrast was administered. COMPARISON:   08/27/2015 FINDINGS: Severe edema signal and poor cortical definition in the middle and distal phalanges of the third toe compatible with acute osteomyelitis. These 2 phalanges might be fused. There is also osteomyelitis involving the distal head of the proximal phalanx of the third toe. Low-level edema in the shaft of the proximal phalanx second toe is noted but may be reactive, and is not as fulminant as I would expect for osteomyelitis. Lisfranc ligament intact. There is considerable degenerative arthropathy at the Lisfranc joint.  Hallux valgus noted with bony bunion and degenerative spurring at the first MTP joint. The second digit is less flexed than the other digits, and accordingly is dorsally positioned. Abnormal subcutaneous edema noted along the dorsal and plantar forefoot. There is low-level edema tracking along the plantar musculature of the foot. Patchy degenerative edema at the calcaneocuboid articulation is seen on the coronal images. Small degenerative subcortical cystic lesion in the navicular. IMPRESSION: 1. Osteomyelitis of the phalanges of the third toe, more severely involving the middle and distal phalanges, but also with some distal involvement of the proximal phalanx. Surrounding cellulitis in the toe and also tracking in surrounding toes, the dorsum of the foot, and plantar foot. 2. Edema tracks along the plantar musculature of the foot and myositis is not excluded. 3. 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 Hallux valgus and bony bunion noted. 4. Subtle edema in the proximal phalanx of the second toe, favor reactive etiology rather than osteomyelitis of the second toe. Electronically Signed   By: Gaylyn Rong M.D.   On: 08/29/2015 11:25   Dg Foot 2 Views Right  08/19/2015  AP and lateral xrays right foot were taken. Midfoot arthritis noted.  Hammer toe digit.  HAV deformity 1st MPJ.  There is white bone in third toe with no evidence of cystic formation.   No fracture dislocation or tumor noted.  Bone density appears normal for patients age and sex.  Joint space appears normal.  No frank deformity.   Helane Gunther DPM  Dg Foot Complete Right  08/27/2015  CLINICAL DATA:  Diabetic ulcer right foot, muscle necrosis, please evaluate bony changes at distal phalanx right third toe EXAM: RIGHT FOOT COMPLETE - 3+ VIEW COMPARISON:  08/17/2015 and 08/26/2014 FINDINGS: Three views of the right foot submitted. No acute fracture or subluxation. Again noted hallux valgus deformity. There is diffuse osteopenia. Small plantar spur of calcaneus. Atherosclerotic vascular calcifications are noted. There is dorsal spurring tarsal metatarsal joint. Significant soft tissue swelling noted right third toe. There is osteo lysis and some bony fragmentation at the tip of distal phalanx third third toe. Osteomyelitis cannot be excluded. Further correlation with MRI is recommended. IMPRESSION: No acute fracture or subluxation. Again noted hallux valgus deformity. There is diffuse osteopenia. Small plantar spur of calcaneus. Atherosclerotic vascular calcifications are noted. There is dorsal spurring tarsal metatarsal joint. Significant soft tissue swelling noted right third toe. There is osteo lysis and some bony fragmentation at the tip of distal phalanx third third toe. Osteomyelitis cannot be excluded. Further correlation with MRI is recommended. Electronically Signed   By: Natasha Mead M.D.   On: 08/27/2015 16:34    Time Spent in minutes  30   Pearson Grippe M.D on 09/04/2015 at 10:26 AM  Between 7am to 7pm - Pager - [phone]  After 7pm go to [email or web address] - password [email or web address]  [facility] -  Office  [phone]

## 2015-09-05 DIAGNOSIS — F4322 Adjustment disorder with anxiety: Secondary | ICD-10-CM

## 2015-09-05 DIAGNOSIS — N179 Acute kidney failure, unspecified: Secondary | ICD-10-CM

## 2015-09-05 DIAGNOSIS — N183 Chronic kidney disease, stage 3 (moderate): Secondary | ICD-10-CM

## 2015-09-05 DIAGNOSIS — I739 Peripheral vascular disease, unspecified: Secondary | ICD-10-CM

## 2015-09-05 LAB — COMPREHENSIVE METABOLIC PANEL
ALT: 14 U/L (ref 14–54)
AST: 20 U/L (ref 15–41)
Albumin: 2.3 g/dL — ABNORMAL LOW (ref 3.5–5.0)
Alkaline Phosphatase: 52 U/L (ref 38–126)
Anion gap: 7 (ref 5–15)
BUN: 10 mg/dL (ref 6–20)
CO2: 26 mmol/L (ref 22–32)
Calcium: 9.1 mg/dL (ref 8.9–10.3)
Chloride: 102 mmol/L (ref 101–111)
Creatinine, Ser: 1.46 mg/dL — ABNORMAL HIGH (ref 0.44–1.00)
GFR calc Af Amer: 35 mL/min — ABNORMAL LOW (ref >60)
GFR calc non Af Amer: 30 mL/min — ABNORMAL LOW (ref >60)
Glucose, Bld: 95 mg/dL (ref 65–99)
Potassium: 4 mmol/L (ref 3.5–5.1)
Sodium: 135 mmol/L (ref 135–145)
Total Bilirubin: 0.5 mg/dL (ref 0.3–1.2)
Total Protein: 5.7 g/dL — ABNORMAL LOW (ref 6.5–8.1)

## 2015-09-05 LAB — CBC
HCT: 29.7 % — ABNORMAL LOW (ref 36.0–46.0)
Hemoglobin: 9.9 g/dL — ABNORMAL LOW (ref 12.0–15.0)
MCH: 31.1 pg (ref 26.0–34.0)
MCHC: 33.3 g/dL (ref 30.0–36.0)
MCV: 93.4 fL (ref 78.0–100.0)
Platelets: 285 10*3/uL (ref 150–400)
RBC: 3.18 MIL/uL — ABNORMAL LOW (ref 3.87–5.11)
RDW: 19.1 % — ABNORMAL HIGH (ref 11.5–15.5)
WBC: 20.7 10*3/uL — ABNORMAL HIGH (ref 4.0–10.5)

## 2015-09-05 LAB — GLUCOSE, CAPILLARY
Glucose-Capillary: 109 mg/dL — ABNORMAL HIGH (ref 65–99)
Glucose-Capillary: 130 mg/dL — ABNORMAL HIGH (ref 65–99)

## 2015-09-05 MED ORDER — CARVEDILOL 3.125 MG PO TABS: 3.1250 mg | ORAL_TABLET | Freq: Two times a day (BID) | ORAL | Status: DC

## 2015-09-05 MED ORDER — AMOXICILLIN-POT CLAVULANATE 500-125 MG PO TABS: 1.0000 | ORAL_TABLET | Freq: Two times a day (BID) | ORAL | Status: DC

## 2015-09-05 MED ORDER — HYDRALAZINE HCL 50 MG PO TABS
50.0000 mg | ORAL_TABLET | Freq: Four times a day (QID) | ORAL | Status: DC
  Administered 2015-09-05: 50 mg via ORAL
  Filled 2015-09-05: qty 1

## 2015-09-05 MED ORDER — LEVOTHYROXINE SODIUM 88 MCG PO TABS: 88.0000 ug | ORAL_TABLET | Freq: Every day | ORAL | Status: DC

## 2015-09-05 MED ORDER — HYDRALAZINE HCL 50 MG PO TABS: 50.0000 mg | ORAL_TABLET | Freq: Four times a day (QID) | ORAL | Status: DC

## 2015-09-05 MED ORDER — METOCLOPRAMIDE HCL 5 MG PO TABS: 5.0000 mg | ORAL_TABLET | Freq: Three times a day (TID) | ORAL | Status: DC

## 2015-09-05 NOTE — Care Management Note (Signed)
Case Management Note  Patient Details  Name: Darshae Munion MRN: [id number] Date of Birth: 09-Feb-1924  Subjective/Objective:                    Action/Plan:  Plan is to d/c to home today. Pt states will assume care for self with the help from her daughter if needed once d/c, declines any home health services. Expected Discharge Date:   09/05/2015           Expected Discharge Plan:  Home/selfcare  In-House Referral:     Discharge planning Services  CM Consult  Post Acute Care Choice:    Choice offered to:     DME Arranged:    DME Agency:     HH Arranged:    HH Agency:     Status of Service:  Completed, signed off  Medicare Important Message Given:  Yes Date Medicare IM Given:    Medicare IM give by:    Date Additional Medicare IM Given:    Additional Medicare Important Message give by:     If discussed at [place] of Stay Meetings, dates discussed:    Additional Comments:  Sharin Mons, RN 09/05/2015, 1:29 PM

## 2015-09-05 NOTE — Discharge Summary (Signed)
Shannon [name], is a 82 y.o. female  DOB May 01, 1923  MRN [id number].  Admission date:  08/26/2015  Admitting Physician  Joseph Art, DO  Discharge Date:  09/05/2015   Primary MD  Kirt Boys, DO  Recommendations for primary care physician for things to follow:   Started on augmentin 500/125mg  po bid x 4 weeks for osteomyellitis Pt declined surgical amputation  Please check bp to ensure good control Hydralazine was increased during this visit to qid, and carvedilol added.   Please check tsh in 4 -8 weeks Synthroid increased to 88 mcg (prev 50 mcg)  Check cbc, cmp, to ensure renal insufficiency stable Off lasix    Admission Diagnosis  N&V (nausea and vomiting) [R11.2] Diverticulitis of large intestine without perforation or abscess without bleeding [K57.32] Non-intractable vomiting with nausea, vomiting of unspecified type [R11.2]   Discharge Diagnosis  N&V (nausea and vomiting) [R11.2] Diverticulitis of large intestine without perforation or abscess without bleeding [K57.32] Non-intractable vomiting with nausea, vomiting of unspecified type [R11.2]    Principal Problem:   Adjustment disorder with anxiety Active Problems:   Essential hypertension, benign   DM (diabetes mellitus) type II uncontrolled, periph vascular disorder (HCC)   Gastric bezoar   Acute renal failure superimposed on stage 3 chronic kidney disease (HCC)   Colitis   Atrial fibrillation (HCC)   Diabetic ulcer of toe of right foot associated with type 2 diabetes mellitus, with necrosis of muscle (HCC)   Diverticulitis of large intestine without perforation or abscess without bleeding   Emesis   Diabetic osteomyelitis (HCC)      Past Medical History  Diagnosis Date  . Pneumonia   . Hypertension   . Small bowel obstruction (HCC)     "I don't know how many" (01/11/2015)  . Ventral hernia with bowel obstruction   .  Asthma   . Hyperlipidemia   . Stroke (HCC)     "light one"  . COPD (chronic obstructive pulmonary disease) (HCC)   . Hypothyroidism   . Type II diabetes mellitus (HCC)   . Anemia     previous blood transfusions  . GERD (gastroesophageal reflux disease)   . History of stomach ulcers   . Arthritis     "all over"  . Renal insufficiency   . Depression     "light case"  . SIADH (syndrome of inappropriate ADH production) (HCC)     [name] 01/10/2015  . Perforated gastric ulcer (HCC)   . Gastric stenosis   . Paraesophageal hernia   . Bradycardia     topped BB and amiodarone decreased- junctional rhythm     Past Surgical History  Procedure Laterality Date  . Tubal ligation    . Colectomy    . Hernia repair  2015  . Esophagogastroduodenoscopy N/A 01/19/2014    Procedure: ESOPHAGOGASTRODUODENOSCOPY (EGD);  Surgeon: Hilarie Fredrickson, MD;  Location: [facility];  Service: Endoscopy;  Laterality: N/A;  . Esophagogastroduodenoscopy N/A 01/20/2014    Procedure: ESOPHAGOGASTRODUODENOSCOPY (EGD);  Surgeon: Wilhemina Bonito  Marina Goodell, MD;  Location: [facility];  Service: Endoscopy;  Laterality: N/A;  . Esophagogastroduodenoscopy N/A 03/19/2014    Procedure: ESOPHAGOGASTRODUODENOSCOPY (EGD);  Surgeon: Rachael Fee, MD;  Location: [facility];  Service: Endoscopy;  Laterality: N/A;  . Cholecystectomy open    . Ventral hernia repair  2015    incarcerated ventral hernia ([facility] 09/2013)/notes 01/10/2015  . Cataract extraction w/ intraocular lens  implant, bilateral    . Gastrojejunostomy      hx/notes 01/10/2015  . Laparotomy N/A 01/20/2015    Procedure: EXPLORATORY LAPAROTOMY;  Surgeon: Abigail Miyamoto, MD;  Location: [facility] OR;  Service: General;  Laterality: N/A;  . Lysis of adhesion N/A 01/20/2015    Procedure: LYSIS OF ADHESIONS < 1 HOUR;  Surgeon: Abigail Miyamoto, MD;  Location: [facility] OR;  Service: General;  Laterality: N/A;  . Esophagogastroduodenoscopy N/A 07/08/2015    Procedure: ESOPHAGOGASTRODUODENOSCOPY  (EGD);  Surgeon: Sherrilyn Rist, MD;  Location: [facility];  Service: Endoscopy;  Laterality: N/A;       HPI  from the history and physical done on the day of admission:    Shannon [name] is a 82 y.o. female with and extensive past medical and surgical history . She's had multiple intestinal surgeries as listed below. Patient known to [facility]. Patient developed nausea several days ago then yesterday began vomiting. She has minor diarrhea and lower abdominal pain a couple of days ago. She just completed antibiotics for toe ulcer. NG tube placed for Gastric outlet, being clamped and trial of clears. Patient has refused amputation of toe with osteomyelitis. Continue IV abx. Palliative care following     Hospital Course:     82 yo female with osteomyelitis of the right 3rd digit.  Pt was seen by vascular and orthopedic foot (Dr. Victorino Dike), and declined surgical amputation.  Pt was seen by  GI for nausea, and started on reglan during this visit. Due to renal insufficiency her lasix has been held.  Her TSH was found to be markedly elevated.  TSH=84.  Her synthroid was increased from 50=>88 mcg po qday.  Pt appears to be doing well and still declines surgery for amputation. Pt is axox3.  Afebrile and in stable condition.  Case was discussed with her daughter.    Follow UP  Follow-up Information    Follow up with Kirt Boys, DO In 1 week.   Specialty:  Internal Medicine   Contact information:   [address] [place] [address] [phone]        Consults obtained - GI, orthopedics, vascular  Discharge Condition: stable  Diet and Activity recommendation: See Discharge Instructions below  Discharge Instructions    F/u with PCP in 1 week.  Social work/case management  to arrange Home Health     Discharge Medications       Medication List    STOP taking these medications        doxycycline 100 MG tablet  Commonly known as:  VIBRA-TABS     furosemide 20  MG tablet  Commonly known as:  LASIX     multivitamin with minerals tablet     sennosides-docusate sodium 8.6-50 MG tablet  Commonly known as:  SENOKOT-S      TAKE these medications        acetaminophen 325 MG tablet  Commonly known as:  TYLENOL  Take 325 mg by mouth every 6 (six) hours as needed for mild pain or fever.     ADVAIR DISKUS 250-50 MCG/DOSE  Aepb  Generic drug:  Fluticasone-Salmeterol  INHALE 1 PUFF INTO THE LUNGS 2 (TWO) TIMES DAILY AS NEEDED FOR SHORTNESS OF BREATH     albuterol 108 (90 Base) MCG/ACT inhaler  Commonly known as:  PROVENTIL HFA;VENTOLIN HFA  Inhale 2 puffs into the lungs every 6 (six) hours as needed for wheezing or shortness of breath.     amiodarone 100 MG tablet  Commonly known as:  PACERONE  Take 1 tablet (100 mg total) by mouth daily.     amLODipine 10 MG tablet  Commonly known as:  NORVASC  TAKE 1 TABLET BY MOUTH DAILY FOR HYPERTENSION     amoxicillin-clavulanate 500-125 MG tablet  Commonly known as:  AUGMENTIN  Take 1 tablet (500 mg total) by mouth 2 (two) times daily.     apixaban 2.5 MG Tabs tablet  Commonly known as:  ELIQUIS  Take 1 tablet (2.5 mg total) by mouth 2 (two) times daily.     atorvastatin 10 MG tablet  Commonly known as:  LIPITOR  Take one tablet by mouth once daily     b complex vitamins tablet  Take 1 tablet by mouth daily.     calcium-vitamin D 500-200 MG-UNIT tablet  Commonly known as:  OSCAL WITH D  Take 1 tablet by mouth daily with breakfast.     carvedilol 3.125 MG tablet  Commonly known as:  COREG  Take 1 tablet (3.125 mg total) by mouth 2 (two) times daily with a meal.     chlorhexidine 0.12 % solution  Commonly known as:  PERIDEX  Use as directed 15 mLs in the mouth or throat 2 (two) times daily.     [employer] GAS RELIEF 80 MG chewable tablet  Generic drug:  simethicone  CHEW 1 TABLET BY MOUTH 4 TIMES A DAY AS NEEDED FOR FLATULENCE     diclofenac sodium 1 % Gel  Commonly known as:  VOLTAREN  Apply  2 g topically daily as needed (for pain).     feeding supplement Liqd  Take 1 Container by mouth 3 (three) times daily between meals.     fluticasone 50 MCG/ACT nasal spray  Commonly known as:  FLONASE  Place 2 sprays into both nostrils daily.     gabapentin 300 MG capsule  Commonly known as:  NEURONTIN  Take 2 caps po in AM and qhs, take 1 cap daily at NOON     hydrALAZINE 50 MG tablet  Commonly known as:  APRESOLINE  Take 1 tablet (50 mg total) by mouth 4 (four) times daily.     isosorbide mononitrate 30 MG 24 hr tablet  Commonly known as:  IMDUR  TAKE 1 TABLET (30 MG TOTAL) BY MOUTH DAILY.     lactulose 10 GM/15ML solution  Commonly known as:  CHRONULAC  Take 15 mLs (10 g total) by mouth daily as needed for mild constipation.     latanoprost 0.005 % ophthalmic solution  Commonly known as:  XALATAN  Place 1 drop into both eyes at bedtime.     levothyroxine 88 MCG tablet  Commonly known as:  SYNTHROID, LEVOTHROID  Take 1 tablet (88 mcg total) by mouth daily before breakfast.     metoCLOPramide 5 MG tablet  Commonly known as:  REGLAN  Take 1 tablet (5 mg total) by mouth 4 (four) times daily -  before meals and at bedtime.     pantoprazole 40 MG tablet  Commonly known as:  PROTONIX  TAKE 1 TABLET BY MOUTH TWICE  A DAY     Polyethyl Glycol-Propyl Glycol 0.4-0.3 % Soln  Place 1 drop into both eyes 4 (four) times daily.     polyethylene glycol packet  Commonly known as:  MIRALAX / GLYCOLAX  Take 17 g by mouth 2 (two) times daily.     RESTASIS 0.05 % ophthalmic emulsion  Generic drug:  cycloSPORINE  USE 1 DROP INTO BOTH EYES TWICE DAILY     sertraline 100 MG tablet  Commonly known as:  ZOLOFT  Take one tablet by mouth once daily for depression     sodium chloride 1 g tablet  TAKE 1 TABLET BY MOUTH THREE TIMES DAILY     traMADol 50 MG tablet  Commonly known as:  ULTRAM  Take 50 mg by mouth 2 (two) times daily as needed for moderate pain.        Major  procedures and Radiology Reports - PLEASE review detailed and final reports for all details, in brief -   Ct Abdomen Pelvis Wo Contrast  08/26/2015  CLINICAL DATA:  Left upper quadrant pain with nausea and vomiting for 1 week. EXAM: CT ABDOMEN AND PELVIS WITHOUT CONTRAST TECHNIQUE: Multidetector CT imaging of the abdomen and pelvis was performed following the standard protocol without IV contrast. COMPARISON:  07/05/2015 FINDINGS: Lower chest and abdominal wall: Extensive ventral abdominal wall scarring with a fatty hernia left of midline, likely incisional. There is fluid within the thick walled lower esophagus which contains refluxed or non cleared fluid. Eventration of the right diaphragm. Hepatobiliary: Multiple hepatic low densities fairly circumscribed and likely cysts.Cholecystectomy. Pancreas: Unremarkable. Spleen: Unremarkable. Adrenals/Urinary Tract: Negative adrenals. No hydronephrosis or stone. Bladder wall thickening. Very Peristranding could be related to generalized edema or cystitis. Reproductive:No pathologic findings. Stomach/Bowel: The stomach is circumferentially thick walled and moderately distended. The antral region is particularly thickened and collapsed. Suture along the lower gastric body. No small bowel dilatation. There is a proximal enteroenteric anastomosis. The proximal colon shows wall thickening and pericolonic edema, greatest along the proximal transverse segment. Colonic inflammatory changes are fairly extensive, more compatible with colitis and diverticulitis. No pneumatosis or pneumoperitoneum. Scattered colonic and ileal diverticulosis. Possible previous sigmoidectomy. Vascular/Lymphatic: No acute vascular Find.  No mass or adenopathy. Peritoneal: No ascites or pneumoperitoneum. Musculoskeletal: No acute abnormalities. Remote appearing T11 superior endplate compression fracture. Lumbar facet arthropathy with mild L5-S1 anterolisthesis. IMPRESSION: 1. Moderately distended  stomach with thickened antral region. Patient has history of chronic impaired gastric emptying and recent EGD. 2. Signs of gastritis and esophagitis. 3. Colitis with nonspecific pattern. 4. Cystitis, correlate with urinalysis. Electronically Signed   By: Marnee Spring M.D.   On: 08/26/2015 09:24   Dg Chest 2 View  08/26/2015  CLINICAL DATA:  Abdominal pain, nausea and vomiting. Gastroesophageal reflux. EXAM: CHEST  2 VIEW COMPARISON:  08/02/2015 and chest CT dated 07/07/2015. FINDINGS: Borderline enlarged cardiac silhouette with an interval decrease in size. Linear density at both lung bases. Mildly prominent pulmonary vasculature. Mild diffuse peribronchial thickening. Stable right diaphragmatic eventration. Atheromatous arterial calcifications. Upper abdominal surgical clips. Diffuse osteopenia. Bilateral shoulder degenerative changes. IMPRESSION: 1. Mildly improved left basilar linear atelectasis and possible scarring. 2. Interval mild right basilar linear atelectasis. 3. Improved cardiomegaly. 4. Mild pulmonary vascular congestion. 5. Mild bronchitic changes. Electronically Signed   By: Beckie Salts M.D.   On: 08/26/2015 07:22   Mr Foot Right Wo Contrast  08/29/2015  CLINICAL DATA:  Right third toe diabetic ulcer. Necrosis of muscle. Fragmentation of the tuft of  the distal phalanx third toe. Query osteomyelitis. EXAM: MRI OF THE RIGHT FOREFOOT WITHOUT CONTRAST TECHNIQUE: Multiplanar, multisequence MR imaging was performed. No intravenous contrast was administered. COMPARISON:  08/27/2015 FINDINGS: Severe edema signal and poor cortical definition in the middle and distal phalanges of the third toe compatible with acute osteomyelitis. These 2 phalanges might be fused. There is also osteomyelitis involving the distal head of the proximal phalanx of the third toe. Low-level edema in the shaft of the proximal phalanx second toe is noted but may be reactive, and is not as fulminant as I would expect for  osteomyelitis. Lisfranc ligament intact. There is considerable degenerative arthropathy at the Lisfranc joint. Hallux valgus noted with bony bunion and degenerative spurring at the first MTP joint. The second digit is less flexed than the other digits, and accordingly is dorsally positioned. Abnormal subcutaneous edema noted along the dorsal and plantar forefoot. There is low-level edema tracking along the plantar musculature of the foot. Patchy degenerative edema at the calcaneocuboid articulation is seen on the coronal images. Small degenerative subcortical cystic lesion in the navicular. IMPRESSION: 1. Osteomyelitis of the phalanges of the third toe, more severely involving the middle and distal phalanges, but also with some distal involvement of the proximal phalanx. Surrounding cellulitis in the toe and also tracking in surrounding toes, the dorsum of the foot, and plantar foot. 2. Edema tracks along the plantar musculature of the foot and myositis is not excluded. 3. 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 Hallux valgus and bony bunion noted. 4. Subtle edema in the proximal phalanx of the second toe, favor reactive etiology rather than osteomyelitis of the second toe. Electronically Signed   By: Gaylyn Rong M.D.   On: 08/29/2015 11:25   Dg Foot 2 Views Right  08/19/2015  AP and lateral xrays right foot were taken. Midfoot arthritis noted.  Hammer toe digit.  HAV deformity 1st MPJ.  There is white bone in third toe with no evidence of cystic formation.  No fracture dislocation or tumor noted.  Bone density appears normal for patients age and sex.  Joint space appears normal.  No frank deformity.   Helane Gunther DPM  Dg Foot Complete Right  08/27/2015  CLINICAL DATA:  Diabetic ulcer right foot, muscle necrosis, please evaluate bony changes at distal phalanx right third toe EXAM: RIGHT FOOT COMPLETE - 3+ VIEW COMPARISON:  08/17/2015 and 08/26/2014 FINDINGS: Three views of  the right foot submitted. No acute fracture or subluxation. Again noted hallux valgus deformity. There is diffuse osteopenia. Small plantar spur of calcaneus. Atherosclerotic vascular calcifications are noted. There is dorsal spurring tarsal metatarsal joint. Significant soft tissue swelling noted right third toe. There is osteo lysis and some bony fragmentation at the tip of distal phalanx third third toe. Osteomyelitis cannot be excluded. Further correlation with MRI is recommended. IMPRESSION: No acute fracture or subluxation. Again noted hallux valgus deformity. There is diffuse osteopenia. Small plantar spur of calcaneus. Atherosclerotic vascular calcifications are noted. There is dorsal spurring tarsal metatarsal joint. Significant soft tissue swelling noted right third toe. There is osteo lysis and some bony fragmentation at the tip of distal phalanx third third toe. Osteomyelitis cannot be excluded. Further correlation with MRI is recommended. Electronically Signed   By: Natasha Mead M.D.   On: 08/27/2015 16:34    Micro Results    Recent Results (from the past 240 hour(s))  C difficile quick scan w PCR reflex     Status: None  Collection Time: 08/28/15  6:14 AM  Result Value Ref Range Status   C Diff antigen NEGATIVE NEGATIVE Final   C Diff toxin NEGATIVE NEGATIVE Final   C Diff interpretation Negative for toxigenic C. difficile  Final       Today   Subjective    Shannon [name] today has been feeling better. Still full at times with eating but nausea improved with reglan.  She has eaten most of her breakfast this am and  wants to go home today.   Objective   Blood pressure 170/78, pulse 80, temperature 98.7 F (37.1 C), temperature source Oral, resp. rate 16, height 5\' 5"  (1.651 m), weight 68.357 kg (150 lb 11.2 oz), SpO2 95 %.   Intake/Output Summary (Last 24 hours) at 09/05/15 0938 Last data filed at 09/04/15 1815  Gross per 24 hour  Intake    700 ml  Output    150 ml  Net     550 ml    Exam Awake Alert, Oriented x 3, No new F.N deficits, Normal affect [place].AT,PERRAL Supple Neck,No JVD, No cervical lymphadenopathy appriciated.  Symmetrical Chest wall movement, Good air movement bilaterally, CTAB RRR,No Gallops,Rubs or new Murmurs, No Parasternal Heave +ve B.Sounds, Abd Soft, Non tender, No organomegaly appriciated, No rebound -guarding or rigidity. No Cyanosis, Clubbing or edema, No new Rash or bruise  3rd digit right foot, slightly red and dark. (known osteomyelitis)   Data Review   CBC w Diff: Lab Results  Component Value Date   WBC 20.7* 09/05/2015   WBC 6.1 07/14/2015   HGB 9.9* 09/05/2015   HCT 29.7* 09/05/2015   HCT 32.4* 07/14/2015   PLT 285 09/05/2015   PLT 404* 02/02/2015   LYMPHOPCT 8 09/02/2015   MONOPCT 3 09/02/2015   EOSPCT 68 09/02/2015   BASOPCT 0 09/02/2015    CMP: Lab Results  Component Value Date   NA 135 09/05/2015   NA 140 07/14/2015   K 4.0 09/05/2015   CL 102 09/05/2015   CO2 26 09/05/2015   BUN 10 09/05/2015   BUN 27 07/14/2015   CREATININE 1.46* 09/05/2015   CREATININE 1.53* 08/10/2015   CREATININE 1.3* 05/06/2014   GLU 126 05/06/2014   PROT 5.7* 09/05/2015   PROT 5.8* 07/14/2015   ALBUMIN 2.3* 09/05/2015   ALBUMIN 3.7 07/14/2015   BILITOT 0.5 09/05/2015   BILITOT 0.3 07/14/2015   ALKPHOS 52 09/05/2015   AST 20 09/05/2015   ALT 14 09/05/2015  .   Total Time in preparing paper work, data evaluation and todays exam - 35 minutes  Pearson Grippe M.D on 09/05/2015 at 9:38 AM  [facility]   Office  [phone]

## 2015-09-05 NOTE — Progress Notes (Signed)
Nsg Discharge Note  Admit Date:  08/26/2015 Discharge date: 09/05/2015   Shannon [name] to be D/C'd Home per MD order.  AVS completed.  Copy for chart, and copy for patient signed, and dated. Patient/caregiver able to verbalize understanding.  Discharge Medication:   Medication List    STOP taking these medications        doxycycline 100 MG tablet  Commonly known as:  VIBRA-TABS     furosemide 20 MG tablet  Commonly known as:  LASIX     multivitamin with minerals tablet     sennosides-docusate sodium 8.6-50 MG tablet  Commonly known as:  SENOKOT-S      TAKE these medications        acetaminophen 325 MG tablet  Commonly known as:  TYLENOL  Take 325 mg by mouth every 6 (six) hours as needed for mild pain or fever.     ADVAIR DISKUS 250-50 MCG/DOSE Aepb  Generic drug:  Fluticasone-Salmeterol  INHALE 1 PUFF INTO THE LUNGS 2 (TWO) TIMES DAILY AS NEEDED FOR SHORTNESS OF BREATH     albuterol 108 (90 Base) MCG/ACT inhaler  Commonly known as:  PROVENTIL HFA;VENTOLIN HFA  Inhale 2 puffs into the lungs every 6 (six) hours as needed for wheezing or shortness of breath.     amiodarone 100 MG tablet  Commonly known as:  PACERONE  Take 1 tablet (100 mg total) by mouth daily.     amLODipine 10 MG tablet  Commonly known as:  NORVASC  TAKE 1 TABLET BY MOUTH DAILY FOR HYPERTENSION     amoxicillin-clavulanate 500-125 MG tablet  Commonly known as:  AUGMENTIN  Take 1 tablet (500 mg total) by mouth 2 (two) times daily.     apixaban 2.5 MG Tabs tablet  Commonly known as:  ELIQUIS  Take 1 tablet (2.5 mg total) by mouth 2 (two) times daily.     atorvastatin 10 MG tablet  Commonly known as:  LIPITOR  Take one tablet by mouth once daily     b complex vitamins tablet  Take 1 tablet by mouth daily.     calcium-vitamin D 500-200 MG-UNIT tablet  Commonly known as:  OSCAL WITH D  Take 1 tablet by mouth daily with breakfast.     carvedilol 3.125 MG tablet  Commonly known as:  COREG  Take  1 tablet (3.125 mg total) by mouth 2 (two) times daily with a meal.     chlorhexidine 0.12 % solution  Commonly known as:  PERIDEX  Use as directed 15 mLs in the mouth or throat 2 (two) times daily.     [employer] GAS RELIEF 80 MG chewable tablet  Generic drug:  simethicone  CHEW 1 TABLET BY MOUTH 4 TIMES A DAY AS NEEDED FOR FLATULENCE     diclofenac sodium 1 % Gel  Commonly known as:  VOLTAREN  Apply 2 g topically daily as needed (for pain).     feeding supplement Liqd  Take 1 Container by mouth 3 (three) times daily between meals.     fluticasone 50 MCG/ACT nasal spray  Commonly known as:  FLONASE  Place 2 sprays into both nostrils daily.     gabapentin 300 MG capsule  Commonly known as:  NEURONTIN  Take 2 caps po in AM and qhs, take 1 cap daily at NOON     hydrALAZINE 50 MG tablet  Commonly known as:  APRESOLINE  Take 1 tablet (50 mg total) by mouth 4 (four) times daily.  isosorbide mononitrate 30 MG 24 hr tablet  Commonly known as:  IMDUR  TAKE 1 TABLET (30 MG TOTAL) BY MOUTH DAILY.     lactulose 10 GM/15ML solution  Commonly known as:  CHRONULAC  Take 15 mLs (10 g total) by mouth daily as needed for mild constipation.     latanoprost 0.005 % ophthalmic solution  Commonly known as:  XALATAN  Place 1 drop into both eyes at bedtime.     levothyroxine 88 MCG tablet  Commonly known as:  SYNTHROID, LEVOTHROID  Take 1 tablet (88 mcg total) by mouth daily before breakfast.     metoCLOPramide 5 MG tablet  Commonly known as:  REGLAN  Take 1 tablet (5 mg total) by mouth 4 (four) times daily -  before meals and at bedtime.     pantoprazole 40 MG tablet  Commonly known as:  PROTONIX  TAKE 1 TABLET BY MOUTH TWICE A DAY     Polyethyl Glycol-Propyl Glycol 0.4-0.3 % Soln  Place 1 drop into both eyes 4 (four) times daily.     polyethylene glycol packet  Commonly known as:  MIRALAX / GLYCOLAX  Take 17 g by mouth 2 (two) times daily.     RESTASIS 0.05 % ophthalmic emulsion   Generic drug:  cycloSPORINE  USE 1 DROP INTO BOTH EYES TWICE DAILY     sertraline 100 MG tablet  Commonly known as:  ZOLOFT  Take one tablet by mouth once daily for depression     sodium chloride 1 g tablet  TAKE 1 TABLET BY MOUTH THREE TIMES DAILY     traMADol 50 MG tablet  Commonly known as:  ULTRAM  Take 50 mg by mouth 2 (two) times daily as needed for moderate pain.        Discharge Assessment: Filed Vitals:   09/04/15 2221 09/05/15 0528  BP: 125/62 170/78  Pulse: 77 80  Temp: 97.6 F (36.4 C) 98.7 F (37.1 C)  Resp: 16 16   Skin clean, dry and intact without evidence of skin break down, no evidence of skin tears noted. IV catheter discontinued intact. Site without signs and symptoms of complications - no redness or edema noted at insertion site, patient denies c/o pain - only slight tenderness at site.  Dressing with slight pressure applied.  D/c Instructions-Education: Discharge instructions given to patient/family with verbalized understanding. D/c education completed with patient/family including follow up instructions, medication list, d/c activities limitations if indicated, with other d/c instructions as indicated by MD - patient able to verbalize understanding, all questions fully answered. Patient instructed to return to ED, call 911, or call MD for any changes in condition.  Patient escorted via [place], and D/C home via private auto.  Salley Slaughter, RN 09/05/2015 2:03 PM

## 2015-09-05 NOTE — Discharge Instructions (Signed)
Please check cbc, cmp in 1 week to assess her renal function and anemia. Lasix is on hold and may need to be restarted.  Also needs follow up of her bp.  In about 4 weeks.  Needs TSH to ensure that improving with increase in levothyroxine.

## 2015-09-07 ENCOUNTER — Telehealth: Payer: Self-pay

## 2015-09-07 ENCOUNTER — Encounter: Payer: Self-pay | Admitting: Nurse Practitioner

## 2015-09-07 ENCOUNTER — Ambulatory Visit (INDEPENDENT_AMBULATORY_CARE_PROVIDER_SITE_OTHER): Payer: Medicare Other | Admitting: Nurse Practitioner

## 2015-09-07 VITALS — BP 138/72 | HR 77 | Temp 98.7°F | Resp 12 | Ht 65.0 in | Wt 151.0 lb

## 2015-09-07 DIAGNOSIS — K5900 Constipation, unspecified: Secondary | ICD-10-CM

## 2015-09-07 DIAGNOSIS — E038 Other specified hypothyroidism: Secondary | ICD-10-CM

## 2015-09-07 DIAGNOSIS — M869 Osteomyelitis, unspecified: Secondary | ICD-10-CM

## 2015-09-07 DIAGNOSIS — R112 Nausea with vomiting, unspecified: Secondary | ICD-10-CM | POA: Diagnosis not present

## 2015-09-07 DIAGNOSIS — E1169 Type 2 diabetes mellitus with other specified complication: Secondary | ICD-10-CM | POA: Diagnosis not present

## 2015-09-07 DIAGNOSIS — N183 Chronic kidney disease, stage 3 unspecified: Secondary | ICD-10-CM

## 2015-09-07 DIAGNOSIS — I10 Essential (primary) hypertension: Secondary | ICD-10-CM | POA: Diagnosis not present

## 2015-09-07 DIAGNOSIS — E222 Syndrome of inappropriate secretion of antidiuretic hormone: Secondary | ICD-10-CM | POA: Diagnosis not present

## 2015-09-07 DIAGNOSIS — R600 Localized edema: Secondary | ICD-10-CM

## 2015-09-07 MED ORDER — ONDANSETRON HCL 4 MG PO TABS: 4.0000 mg | ORAL_TABLET | Freq: Three times a day (TID) | ORAL | Status: DC | PRN

## 2015-09-07 NOTE — Patient Instructions (Addendum)
May use zofran 4 mg as needed for nausea for now To follow up with Gastroenterologist for further evaluation  If increase abdominal pain or no BM by Friday will need to be seen  To follow up lab Wednesday and Friday for CBC and BMP TSH needs to be obtained in 6 weeks

## 2015-09-07 NOTE — Telephone Encounter (Signed)
Message left on triage voicemail: Patient needs a rx for nausea medication. Patient was recently seen in the hospital  I called patient's daughter back and informed her that patient was instructed to follow-up in 1 week with PCP. Patient will need appointment prior to nausea medication being called in. Patient had a pending appointment with Sherrie Mustache for Thursday 09-09-15, patient rescheduled to today at 11:15 am. Patient unable to come in on Wednesday to see PCP for her daughter has other commitments on that date

## 2015-09-07 NOTE — Telephone Encounter (Signed)
I called patient's daughter, [name], to inform her that Janett Billow wanted to make sure that patient was restarted on sodium tablet and senokot s, which were both stopped at recent hospital stay. [name] stated that she would restart these medications and did not have any questions at this time.

## 2015-09-07 NOTE — Progress Notes (Signed)
Patient ID: Shannon [name], female   DOB: 05-11-1923, 82 y.o.   MRN: [id number]    PCP: Kirt Boys, DO  Advanced Directive information Does patient have an advance directive?: Yes, Type of Advance Directive: Healthcare Power of Attorney, Does patient want to make changes to advanced directive?: No - Patient declined  No Known Allergies  Chief Complaint  Patient presents with  . Hospitalization Follow-up    Hospital to admission on 08-26-15, patient here today with daughter [name] for a hospital follow-up. Patient would like to discuss lasix that was d/c'ed in hospital. Patient also c/o nausea and would like exam of 3rd toe on right foot.   [name] FYI    Pending appointment with Foot Doctor on 09-27-15 and pending appointment with wound center on 09-21-15     HPI: Patient is a 82 y.o. female seen in the office today for hospital follow up. Pt went to the hospital after nausea, vomiting and unable to move bowels or bladder, she was hospitalized and monitored. Also noted to have osteomyelitis of the right 3rd digit. Pt was seen by vascular and orthopedic foot (Dr. Victorino Dike), and declined surgical amputation. Pt was seen by [facility] for nausea, and started on reglan. Due to renal insufficiency her lasix has been held. Her TSH was found to be markedly elevated. TSH=84. Her synthroid was increased from 50=>88 mcg po qday. Pt was discharge from hospital on 09/05/15 and in the office today to follow up. Pt with daughter.  Pt conts to vomit with any intake. Pt will take in puree but then with minimal intake feels full, start bleaching and throw up the food and then she feels nauseous. Taking reglan which she does not feel ever helped. Unable to tolerate clear liquids. Taking protonix. Saw gastroenterologist during hospitalization but no follow up given.  Still not having regular BMs. Had liquid BM Sunday. Taking miralax twice daily.   Taking augment twice daily for 4 weeks due to osteomyelitis Gaining  fluids in her legs again- off lasix due to renal insuffiencey   Review of Systems:  Review of Systems  Constitutional: Positive for appetite change and fatigue.  Respiratory: Negative for shortness of breath.   Cardiovascular: Positive for leg swelling.  Gastrointestinal: Positive for nausea, vomiting, abdominal pain and constipation. Negative for blood in stool and abdominal distention.       Increased fluctuance   Genitourinary: Negative for dysuria.  Musculoskeletal: Positive for neck pain and neck stiffness. Negative for arthralgias.  Neurological: Negative for dizziness and numbness.  Psychiatric/Behavioral: Negative for suicidal ideas.  All other systems reviewed and are negative.   Past Medical History  Diagnosis Date  . Pneumonia   . Hypertension   . Small bowel obstruction (HCC)     "I don't know how many" (01/11/2015)  . Ventral hernia with bowel obstruction   . Asthma   . Hyperlipidemia   . Stroke (HCC)     "light one"  . COPD (chronic obstructive pulmonary disease) (HCC)   . Hypothyroidism   . Type II diabetes mellitus (HCC)   . Anemia     previous blood transfusions  . GERD (gastroesophageal reflux disease)   . History of stomach ulcers   . Arthritis     "all over"  . Renal insufficiency   . Depression     "light case"  . SIADH (syndrome of inappropriate ADH production) (HCC)     [name] 01/10/2015  . Perforated gastric ulcer (HCC)   . Gastric stenosis   .  Paraesophageal hernia   . Bradycardia     topped BB and amiodarone decreased- junctional rhythm    Past Surgical History  Procedure Laterality Date  . Tubal ligation    . Colectomy    . Hernia repair  2015  . Esophagogastroduodenoscopy N/A 01/19/2014    Procedure: ESOPHAGOGASTRODUODENOSCOPY (EGD);  Surgeon: Hilarie Fredrickson, MD;  Location: [facility];  Service: Endoscopy;  Laterality: N/A;  . Esophagogastroduodenoscopy N/A 01/20/2014    Procedure: ESOPHAGOGASTRODUODENOSCOPY (EGD);  Surgeon: Hilarie Fredrickson, MD;  Location: [facility];  Service: Endoscopy;  Laterality: N/A;  . Esophagogastroduodenoscopy N/A 03/19/2014    Procedure: ESOPHAGOGASTRODUODENOSCOPY (EGD);  Surgeon: Rachael Fee, MD;  Location: [facility];  Service: Endoscopy;  Laterality: N/A;  . Cholecystectomy open    . Ventral hernia repair  2015    incarcerated ventral hernia ([facility] 09/2013)/notes 01/10/2015  . Cataract extraction w/ intraocular lens  implant, bilateral    . Gastrojejunostomy      hx/notes 01/10/2015  . Laparotomy N/A 01/20/2015    Procedure: EXPLORATORY LAPAROTOMY;  Surgeon: Abigail Miyamoto, MD;  Location: [facility] OR;  Service: General;  Laterality: N/A;  . Lysis of adhesion N/A 01/20/2015    Procedure: LYSIS OF ADHESIONS < 1 HOUR;  Surgeon: Abigail Miyamoto, MD;  Location: [facility] OR;  Service: General;  Laterality: N/A;  . Esophagogastroduodenoscopy N/A 07/08/2015    Procedure: ESOPHAGOGASTRODUODENOSCOPY (EGD);  Surgeon: Sherrilyn Rist, MD;  Location: [facility];  Service: Endoscopy;  Laterality: N/A;   Social History:   reports that she has never smoked. She has quit using smokeless tobacco. Her smokeless tobacco use included Snuff. She reports that she does not drink alcohol or use illicit drugs.  Family History  Problem Relation Age of Onset  . Stroke Mother   . Heart attack Neg Hx   . Hypertension Mother   . Diabetes Brother     Medications: Patient's Medications  New Prescriptions   No medications on file  Previous Medications   ACETAMINOPHEN (TYLENOL) 325 MG TABLET    Take 325 mg by mouth every 6 (six) hours as needed for mild pain or fever.    ADVAIR DISKUS 250-50 MCG/DOSE AEPB    INHALE 1 PUFF INTO THE LUNGS 2 (TWO) TIMES DAILY AS NEEDED FOR SHORTNESS OF BREATH   ALBUTEROL (PROVENTIL HFA;VENTOLIN HFA) 108 (90 BASE) MCG/ACT INHALER    Inhale 2 puffs into the lungs every 6 (six) hours as needed for wheezing or shortness of breath.   AMIODARONE (PACERONE) 100 MG TABLET    Take 1 tablet (100 mg  total) by mouth daily.   AMLODIPINE (NORVASC) 10 MG TABLET    TAKE 1 TABLET BY MOUTH DAILY FOR HYPERTENSION   AMOXICILLIN-CLAVULANATE (AUGMENTIN) 500-125 MG TABLET    Take 1 tablet (500 mg total) by mouth 2 (two) times daily.   APIXABAN (ELIQUIS) 2.5 MG TABS TABLET    Take 1 tablet (2.5 mg total) by mouth 2 (two) times daily.   ATORVASTATIN (LIPITOR) 10 MG TABLET    Take one tablet by mouth once daily   B COMPLEX VITAMINS TABLET    Take 1 tablet by mouth daily.    CALCIUM-VITAMIN D (OSCAL WITH D) 500-200 MG-UNIT PER TABLET    Take 1 tablet by mouth daily with breakfast.    CARVEDILOL (COREG) 3.125 MG TABLET    Take 1 tablet (3.125 mg total) by mouth 2 (two) times daily with a meal.   CHLORHEXIDINE (PERIDEX) 0.12 % SOLUTION  Use as directed 15 mLs in the mouth or throat 2 (two) times daily.   [employer] GAS RELIEF 80 MG CHEWABLE TABLET    CHEW 1 TABLET BY MOUTH 4 TIMES A DAY AS NEEDED FOR FLATULENCE   DICLOFENAC SODIUM (VOLTAREN) 1 % GEL    Apply 2 g topically daily as needed (for pain).    FEEDING SUPPLEMENT (BOOST / RESOURCE BREEZE) LIQD    Take 1 Container by mouth 3 (three) times daily between meals.   FLUTICASONE (FLONASE) 50 MCG/ACT NASAL SPRAY    Place 2 sprays into both nostrils daily.   GABAPENTIN (NEURONTIN) 300 MG CAPSULE    Take 2 caps po in AM and qhs, take 1 cap daily at NOON   HYDRALAZINE (APRESOLINE) 50 MG TABLET    Take 1 tablet (50 mg total) by mouth 4 (four) times daily.   ISOSORBIDE MONONITRATE (IMDUR) 30 MG 24 HR TABLET    TAKE 1 TABLET (30 MG TOTAL) BY MOUTH DAILY.   LACTULOSE (CHRONULAC) 10 GM/15ML SOLUTION    Take 15 mLs (10 g total) by mouth daily as needed for mild constipation.   LATANOPROST (XALATAN) 0.005 % OPHTHALMIC SOLUTION    Place 1 drop into both eyes at bedtime.   LEVOTHYROXINE (SYNTHROID, LEVOTHROID) 88 MCG TABLET    Take 1 tablet (88 mcg total) by mouth daily before breakfast.   METOCLOPRAMIDE (REGLAN) 5 MG TABLET    Take 1 tablet (5 mg total) by mouth 4 (four)  times daily -  before meals and at bedtime.   PANTOPRAZOLE (PROTONIX) 40 MG TABLET    TAKE 1 TABLET BY MOUTH TWICE A DAY   POLYETHYL GLYCOL-PROPYL GLYCOL 0.4-0.3 % SOLN    Place 1 drop into both eyes 4 (four) times daily.    POLYETHYLENE GLYCOL (MIRALAX / GLYCOLAX) PACKET    Take 17 g by mouth 2 (two) times daily.    RESTASIS 0.05 % OPHTHALMIC EMULSION    USE 1 DROP INTO BOTH EYES TWICE DAILY   SERTRALINE (ZOLOFT) 100 MG TABLET    Take one tablet by mouth once daily for depression   SODIUM CHLORIDE 1 G TABLET    TAKE 1 TABLET BY MOUTH THREE TIMES DAILY   TRAMADOL (ULTRAM) 50 MG TABLET    Take 50 mg by mouth 2 (two) times daily as needed for moderate pain.   Modified Medications   No medications on file  Discontinued Medications   No medications on file     Physical Exam:  Filed Vitals:   09/07/15 1138  BP: 138/72  Pulse: 77  Temp: 98.7 F (37.1 C)  TempSrc: Oral  Resp: 12  Height: 5\' 5"  (1.651 m)  Weight: 151 lb (68.493 kg)  SpO2: 92%   Body mass index is 25.13 kg/(m^2).  Physical Exam  Constitutional: She appears well-developed.  HENT:  Mouth/Throat: Oropharynx is clear and moist. No oropharyngeal exudate.  Eyes: Pupils are equal, round, and reactive to light.  Neck: Neck supple. Carotid bruit is not present.  Cardiovascular: Normal rate, regular rhythm and normal heart sounds.  Exam reveals no gallop and no friction rub.   Pulmonary/Chest: Effort normal and breath sounds normal. No respiratory distress. She has no wheezes. She has no rales.  Abdominal: Soft. Bowel sounds are normal. She exhibits no distension and no mass. There is no hepatomegaly. There is no tenderness. There is no rebound and no guarding.  Musculoskeletal: She exhibits edema.  1+ edema bilaterally  Neurological: She is alert.  Skin:  Skin is warm and dry.  Right foot with dressing and boot  Psychiatric: She has a normal mood and affect. Her behavior is normal. Judgment and thought content normal.     Labs reviewed: Basic Metabolic Panel:  Recent Labs  86/57/84 0415 01/22/15 0445 01/22/15 0831  07/14/15 1457  08/02/15 1638  08/26/15 1615  09/01/15 0545 09/02/15 0544 09/03/15 0557 09/05/15 0429  NA 133* 124* 133*  < > 140  < > 129*  131*  < >  --   < > 137 135 134* 135  K 4.0 5.4* 4.1  < > 4.5  < > 5.2*  5.2*  < >  --   < > 3.5 3.3* 3.3* 4.0  CL 105 98* 106  < > 101  < > 99*  98*  < >  --   < > 106 104 101 102  CO2 21* 20* 21*  < > 24  < > 20*  < >  --   < > 22 25 24 26   GLUCOSE 157* 782* 158*  < > 132*  < > 157*  148*  < >  --   < > 92 120* 111* 95  BUN 19 22* 23*  < > 27  < > 26*  27*  < >  --   < > 12 8 6 10   CREATININE 1.17* 1.12* 1.05*  < > 1.24*  < > 1.95*  1.90*  < >  --   < > 1.65* 1.43* 1.32* 1.46*  CALCIUM 8.8* 8.4* 8.7*  < > 9.4  < > 9.2  < >  --   < > 8.7* 8.6* 9.0 9.1  MG 1.6* 2.3 1.9  --   --   --  1.8  --   --   --  1.5*  --  1.8  --   PHOS 3.5 5.8* 3.1  --   --   --   --   --   --   --   --   --   --   --   TSH  --   --   --   < > 4.260  --  83.858*  --  84.851*  --   --   --   --   --   < > = values in this interval not displayed. Liver Function Tests:  Recent Labs  08/10/15 1249 08/26/15 0630 09/05/15 0429  AST 25 37 20  ALT 19 27 14   ALKPHOS 50 118 52  BILITOT 0.5 0.5 0.5  PROT 6.7 6.6 5.7*  ALBUMIN 3.7 3.4* 2.3*    Recent Labs  01/10/15 0131 08/26/15 0630  LIPASE 21* 18   No results for input(s): AMMONIA in the last 8760 hours. CBC:  Recent Labs  07/14/15 1457  08/02/15 1638  09/02/15 0544 09/02/15 0932 09/03/15 0557 09/05/15 0429  WBC 6.1  < > 4.9  < > 25.2*  --  23.4* 20.7*  NEUTROABS 4.0  --  2.8  --   --  5.3  --   --   HGB  --   < > 9.6*  10.9*  < > 10.2*  --  10.5* 9.9*  HCT 32.4*  < > 28.3*  32.0*  < > 30.3*  --  31.2* 29.7*  MCV 93  < > 91.3  < > 93.2  --  92.9 93.4  PLT  --   < > 363  < > 270  --  280  285  < > = values in this interval not displayed. Lipid Panel:  Recent Labs  11/02/14 1029  01/20/15 0600 04/07/15 1015  CHOL 124  --  126  HDL 75  --  69  LDLCALC 33  --  36  TRIG 82 91 107  CHOLHDL 1.7  --  1.8   TSH:  Recent Labs  07/14/15 1457 08/02/15 1638 08/26/15 1615  TSH 4.260 83.858* 84.851*   A1C: Lab Results  Component Value Date   HGBA1C 6.5* 08/28/2015     Assessment/Plan 1. Other specified hypothyroidism - Lab Results  Component Value Date   TSH 84.851* 08/26/2015   -synthroid increase in hospital to 88 mcg, pt complaint with medication - TSH; Future  2. Diabetic osteomyelitis (HCC) -conts to follow up with podiatry and wound care center -to cont current plan of care - CBC with Differential/Platelet; Future  3. CKD (chronic kidney disease) stage 3, GFR 30-59 ml/min Lasix has been stopped in hospital due to increase in Cr, will cont to hold at this time and follow up BMP - Basic metabolic panel; Future  4. Non-intractable vomiting with nausea, vomiting of unspecified type -ongoing nausea and vomiting. conts on reglan ACHS -may use zofran 4 mg q 8 hours PRN to help with symptom control.  -pt to follow up with GI  5. Constipation, unspecified constipation type -ongoing, senokot s stopped at hospital, to restart daily -to cont miralax BID  6. SIADH (syndrome of inappropriate ADH production) (HCC) -to cont sodium chloride   7. LE Edema -increased swelling to LE, no pain or discomfort noted to patient. Lasix DC'd due to increase in Cr. Will monitor for now. Cont to elevate LE, use compression hose and maintain a low sodium diet.   8. Hypertension -blood pressure stable, to cont hydralazine, coreg, norvasc,   To follow up cbc, bmp later this week  To keep follow up with Dr Montez Morita in 1 month, sooner if needed Baldwin K. Biagio Borg  [facility] [phone] 8 am - 5 pm) [phone] (after hours) \

## 2015-09-09 ENCOUNTER — Ambulatory Visit: Payer: Medicare Other | Admitting: Nurse Practitioner

## 2015-09-10 ENCOUNTER — Other Ambulatory Visit: Payer: Medicare Other

## 2015-09-10 DIAGNOSIS — E038 Other specified hypothyroidism: Secondary | ICD-10-CM | POA: Diagnosis not present

## 2015-09-10 DIAGNOSIS — N183 Chronic kidney disease, stage 3 unspecified: Secondary | ICD-10-CM

## 2015-09-10 DIAGNOSIS — E1169 Type 2 diabetes mellitus with other specified complication: Secondary | ICD-10-CM

## 2015-09-10 DIAGNOSIS — E039 Hypothyroidism, unspecified: Secondary | ICD-10-CM

## 2015-09-10 DIAGNOSIS — M869 Osteomyelitis, unspecified: Secondary | ICD-10-CM | POA: Diagnosis not present

## 2015-09-11 ENCOUNTER — Emergency Department (HOSPITAL_COMMUNITY): Payer: Medicare Other

## 2015-09-11 ENCOUNTER — Encounter (HOSPITAL_COMMUNITY): Payer: Self-pay | Admitting: Emergency Medicine

## 2015-09-11 ENCOUNTER — Inpatient Hospital Stay (HOSPITAL_COMMUNITY)
Admission: EM | Admit: 2015-09-11 | Discharge: 2015-09-30 | DRG: 177 | Disposition: A | Discharge status: Home Health | Payer: Medicare Other | Attending: Internal Medicine | Admitting: Internal Medicine

## 2015-09-11 ENCOUNTER — Other Ambulatory Visit: Payer: Self-pay | Admitting: Internal Medicine

## 2015-09-11 DIAGNOSIS — J690 Pneumonitis due to inhalation of food and vomit: Secondary | ICD-10-CM | POA: Diagnosis not present

## 2015-09-11 DIAGNOSIS — M86171 Other acute osteomyelitis, right ankle and foot: Secondary | ICD-10-CM

## 2015-09-11 DIAGNOSIS — K2980 Duodenitis without bleeding: Secondary | ICD-10-CM | POA: Diagnosis present

## 2015-09-11 DIAGNOSIS — Z8711 Personal history of peptic ulcer disease: Secondary | ICD-10-CM

## 2015-09-11 DIAGNOSIS — N183 Chronic kidney disease, stage 3 (moderate): Secondary | ICD-10-CM | POA: Diagnosis present

## 2015-09-11 DIAGNOSIS — E43 Unspecified severe protein-calorie malnutrition: Secondary | ICD-10-CM | POA: Diagnosis present

## 2015-09-11 DIAGNOSIS — L8990 Pressure ulcer of unspecified site, unspecified stage: Secondary | ICD-10-CM | POA: Insufficient documentation

## 2015-09-11 DIAGNOSIS — E872 Acidosis: Secondary | ICD-10-CM | POA: Diagnosis not present

## 2015-09-11 DIAGNOSIS — Z931 Gastrostomy status: Secondary | ICD-10-CM

## 2015-09-11 DIAGNOSIS — J189 Pneumonia, unspecified organism: Secondary | ICD-10-CM | POA: Diagnosis not present

## 2015-09-11 DIAGNOSIS — I252 Old myocardial infarction: Secondary | ICD-10-CM

## 2015-09-11 DIAGNOSIS — E1165 Type 2 diabetes mellitus with hyperglycemia: Secondary | ICD-10-CM | POA: Diagnosis present

## 2015-09-11 DIAGNOSIS — Y95 Nosocomial condition: Secondary | ICD-10-CM | POA: Diagnosis present

## 2015-09-11 DIAGNOSIS — Z79899 Other long term (current) drug therapy: Secondary | ICD-10-CM

## 2015-09-11 DIAGNOSIS — K3189 Other diseases of stomach and duodenum: Secondary | ICD-10-CM | POA: Diagnosis not present

## 2015-09-11 DIAGNOSIS — E039 Hypothyroidism, unspecified: Secondary | ICD-10-CM | POA: Diagnosis present

## 2015-09-11 DIAGNOSIS — K219 Gastro-esophageal reflux disease without esophagitis: Secondary | ICD-10-CM | POA: Diagnosis present

## 2015-09-11 DIAGNOSIS — N179 Acute kidney failure, unspecified: Secondary | ICD-10-CM | POA: Diagnosis not present

## 2015-09-11 DIAGNOSIS — R109 Unspecified abdominal pain: Secondary | ICD-10-CM | POA: Diagnosis not present

## 2015-09-11 DIAGNOSIS — I5032 Chronic diastolic (congestive) heart failure: Secondary | ICD-10-CM | POA: Diagnosis present

## 2015-09-11 DIAGNOSIS — I10 Essential (primary) hypertension: Secondary | ICD-10-CM | POA: Diagnosis present

## 2015-09-11 DIAGNOSIS — K5730 Diverticulosis of large intestine without perforation or abscess without bleeding: Secondary | ICD-10-CM | POA: Diagnosis not present

## 2015-09-11 DIAGNOSIS — E1152 Type 2 diabetes mellitus with diabetic peripheral angiopathy with gangrene: Secondary | ICD-10-CM | POA: Diagnosis present

## 2015-09-11 DIAGNOSIS — D638 Anemia in other chronic diseases classified elsewhere: Secondary | ICD-10-CM | POA: Diagnosis present

## 2015-09-11 DIAGNOSIS — Z833 Family history of diabetes mellitus: Secondary | ICD-10-CM

## 2015-09-11 DIAGNOSIS — E1169 Type 2 diabetes mellitus with other specified complication: Secondary | ICD-10-CM | POA: Diagnosis present

## 2015-09-11 DIAGNOSIS — F329 Major depressive disorder, single episode, unspecified: Secondary | ICD-10-CM | POA: Diagnosis present

## 2015-09-11 DIAGNOSIS — Z431 Encounter for attention to gastrostomy: Secondary | ICD-10-CM

## 2015-09-11 DIAGNOSIS — Z9841 Cataract extraction status, right eye: Secondary | ICD-10-CM

## 2015-09-11 DIAGNOSIS — I130 Hypertensive heart and chronic kidney disease with heart failure and stage 1 through stage 4 chronic kidney disease, or unspecified chronic kidney disease: Secondary | ICD-10-CM | POA: Diagnosis present

## 2015-09-11 DIAGNOSIS — E1143 Type 2 diabetes mellitus with diabetic autonomic (poly)neuropathy: Secondary | ICD-10-CM | POA: Diagnosis present

## 2015-09-11 DIAGNOSIS — Z9842 Cataract extraction status, left eye: Secondary | ICD-10-CM

## 2015-09-11 DIAGNOSIS — K3184 Gastroparesis: Secondary | ICD-10-CM | POA: Diagnosis present

## 2015-09-11 DIAGNOSIS — Z7951 Long term (current) use of inhaled steroids: Secondary | ICD-10-CM

## 2015-09-11 DIAGNOSIS — E875 Hyperkalemia: Secondary | ICD-10-CM | POA: Diagnosis present

## 2015-09-11 DIAGNOSIS — R1115 Cyclical vomiting syndrome unrelated to migraine: Secondary | ICD-10-CM

## 2015-09-11 DIAGNOSIS — I4891 Unspecified atrial fibrillation: Secondary | ICD-10-CM | POA: Diagnosis present

## 2015-09-11 DIAGNOSIS — K311 Adult hypertrophic pyloric stenosis: Secondary | ICD-10-CM | POA: Diagnosis not present

## 2015-09-11 DIAGNOSIS — N184 Chronic kidney disease, stage 4 (severe): Secondary | ICD-10-CM | POA: Diagnosis present

## 2015-09-11 DIAGNOSIS — Z8673 Personal history of transient ischemic attack (TIA), and cerebral infarction without residual deficits: Secondary | ICD-10-CM

## 2015-09-11 DIAGNOSIS — Z66 Do not resuscitate: Secondary | ICD-10-CM | POA: Diagnosis present

## 2015-09-11 DIAGNOSIS — Z961 Presence of intraocular lens: Secondary | ICD-10-CM | POA: Diagnosis present

## 2015-09-11 DIAGNOSIS — I739 Peripheral vascular disease, unspecified: Secondary | ICD-10-CM | POA: Diagnosis present

## 2015-09-11 DIAGNOSIS — Z7901 Long term (current) use of anticoagulants: Secondary | ICD-10-CM

## 2015-09-11 DIAGNOSIS — R112 Nausea with vomiting, unspecified: Secondary | ICD-10-CM | POA: Diagnosis present

## 2015-09-11 DIAGNOSIS — Z87891 Personal history of nicotine dependence: Secondary | ICD-10-CM

## 2015-09-11 DIAGNOSIS — I480 Paroxysmal atrial fibrillation: Secondary | ICD-10-CM | POA: Diagnosis present

## 2015-09-11 DIAGNOSIS — R110 Nausea: Secondary | ICD-10-CM

## 2015-09-11 DIAGNOSIS — K2970 Gastritis, unspecified, without bleeding: Secondary | ICD-10-CM | POA: Diagnosis not present

## 2015-09-11 DIAGNOSIS — Z8249 Family history of ischemic heart disease and other diseases of the circulatory system: Secondary | ICD-10-CM

## 2015-09-11 DIAGNOSIS — R1084 Generalized abdominal pain: Secondary | ICD-10-CM | POA: Diagnosis present

## 2015-09-11 DIAGNOSIS — Z823 Family history of stroke: Secondary | ICD-10-CM

## 2015-09-11 DIAGNOSIS — E119 Type 2 diabetes mellitus without complications: Secondary | ICD-10-CM

## 2015-09-11 DIAGNOSIS — F32A Depression, unspecified: Secondary | ICD-10-CM | POA: Diagnosis present

## 2015-09-11 DIAGNOSIS — E878 Other disorders of electrolyte and fluid balance, not elsewhere classified: Secondary | ICD-10-CM | POA: Diagnosis present

## 2015-09-11 DIAGNOSIS — J449 Chronic obstructive pulmonary disease, unspecified: Secondary | ICD-10-CM | POA: Diagnosis present

## 2015-09-11 DIAGNOSIS — M869 Osteomyelitis, unspecified: Secondary | ICD-10-CM | POA: Diagnosis present

## 2015-09-11 DIAGNOSIS — R0602 Shortness of breath: Secondary | ICD-10-CM | POA: Diagnosis not present

## 2015-09-11 DIAGNOSIS — E785 Hyperlipidemia, unspecified: Secondary | ICD-10-CM | POA: Diagnosis present

## 2015-09-11 DIAGNOSIS — E876 Hypokalemia: Secondary | ICD-10-CM | POA: Diagnosis present

## 2015-09-11 DIAGNOSIS — E1122 Type 2 diabetes mellitus with diabetic chronic kidney disease: Secondary | ICD-10-CM | POA: Diagnosis present

## 2015-09-11 DIAGNOSIS — R1111 Vomiting without nausea: Secondary | ICD-10-CM | POA: Diagnosis not present

## 2015-09-11 LAB — BASIC METABOLIC PANEL
BUN/Creatinine Ratio: 10 — ABNORMAL LOW (ref 12–28)
BUN: 17 mg/dL (ref 10–36)
CO2: 22 mmol/L (ref 18–29)
Calcium: 9.8 mg/dL (ref 8.7–10.3)
Chloride: 102 mmol/L (ref 96–106)
Creatinine, Ser: 1.64 mg/dL — ABNORMAL HIGH (ref 0.57–1.00)
GFR calc Af Amer: 32 mL/min/{1.73_m2} — ABNORMAL LOW (ref >59)
GFR calc non Af Amer: 27 mL/min/{1.73_m2} — ABNORMAL LOW (ref >59)
Glucose: 111 mg/dL — ABNORMAL HIGH (ref 65–99)
Potassium: 4 mmol/L (ref 3.5–5.2)
Sodium: 145 mmol/L — ABNORMAL HIGH (ref 134–144)

## 2015-09-11 LAB — CBC WITH DIFFERENTIAL/PLATELET
Basophils Absolute: 0.1 10*3/uL (ref 0.0–0.2)
Basos: 0 %
EOS (ABSOLUTE): 9.3 10*3/uL — ABNORMAL HIGH (ref 0.0–0.4)
Eos: 49 %
Hematocrit: 32.5 % — ABNORMAL LOW (ref 34.0–46.6)
Hemoglobin: 10.6 g/dL — ABNORMAL LOW (ref 11.1–15.9)
Immature Grans (Abs): 0 10*3/uL (ref 0.0–0.1)
Immature Granulocytes: 0 %
Lymphocytes Absolute: 1.3 10*3/uL (ref 0.7–3.1)
Lymphs: 7 %
MCH: 32.4 pg (ref 26.6–33.0)
MCHC: 32.6 g/dL (ref 31.5–35.7)
MCV: 99 fL — ABNORMAL HIGH (ref 79–97)
Monocytes Absolute: 0.8 10*3/uL (ref 0.1–0.9)
Monocytes: 4 %
Neutrophils Absolute: 7.7 10*3/uL — ABNORMAL HIGH (ref 1.4–7.0)
Neutrophils: 40 %
Platelets: 337 10*3/uL (ref 150–379)
RBC: 3.27 x10E6/uL — ABNORMAL LOW (ref 3.77–5.28)
RDW: 18.1 % — ABNORMAL HIGH (ref 12.3–15.4)
WBC: 19.1 10*3/uL — ABNORMAL HIGH (ref 3.4–10.8)

## 2015-09-11 LAB — TSH: TSH: 58.26 u[IU]/mL — ABNORMAL HIGH (ref 0.450–4.500)

## 2015-09-11 LAB — T4, FREE: Free T4: 0.72 ng/dL — ABNORMAL LOW (ref 0.82–1.77)

## 2015-09-11 MED ORDER — IOPAMIDOL (ISOVUE-300) INJECTION 61%
80.0000 mL | Freq: Once | INTRAVENOUS | Status: AC | PRN
  Administered 2015-09-11: 80 mL via INTRAVENOUS

## 2015-09-11 MED ORDER — SODIUM CHLORIDE 0.9 % IV BOLUS (SEPSIS)
1000.0000 mL | Freq: Once | INTRAVENOUS | Status: AC
  Administered 2015-09-12: 1000 mL via INTRAVENOUS

## 2015-09-11 MED ORDER — ONDANSETRON HCL 4 MG/2ML IJ SOLN
4.0000 mg | Freq: Once | INTRAMUSCULAR | Status: AC
  Administered 2015-09-12: 4 mg via INTRAVENOUS
  Filled 2015-09-11: qty 2

## 2015-09-11 MED ORDER — FENTANYL CITRATE (PF) 100 MCG/2ML IJ SOLN
100.0000 ug | Freq: Once | INTRAMUSCULAR | Status: AC
  Administered 2015-09-12: 100 ug via INTRAVENOUS
  Filled 2015-09-11: qty 2

## 2015-09-11 NOTE — ED Notes (Signed)
Pt in radiology 

## 2015-09-11 NOTE — ED Provider Notes (Signed)
CSN: [id number]     Arrival date & time 09/11/15  2252 History  By signing my name below, I, Irene Pap, attest that this documentation has been prepared under the direction and in the presence of Everlene Balls, MD. Electronically Signed: Irene Pap, ED Scribe. 09/11/2015. 11:12 PM.  Chief Complaint  Patient presents with  . Abdominal Pain   The history is provided by the patient. No language interpreter was used.  HPI Comments: Shannon [name] is a 82 y.o. female with a hx of HTN, SBO, stroke, Type II DM, GERD, COPD, renal insufficiency, gastric stenosis, and perforated gastric ulcer brought in by EMS who presents to the Emergency Department complaining of generalized abdominal pain onset 2 days ago. Pt reports associated nausea, vomiting, diarrhea, decreased urine output, and abdominal distention. She states that she is unable to eat or drink much and has not had a good bowel movement in a few days. She was recently admitted to the ED on 08/26/15 for diverticulitis. She states that this pain feels similar to the pain she was experiencing upon her last ED visit. She denies fever.  Past Medical History  Diagnosis Date  . Pneumonia   . Hypertension   . Small bowel obstruction ([place])     "I don't know how many" (01/11/2015)  . Ventral hernia with bowel obstruction   . Asthma   . Hyperlipidemia   . Stroke ([place])     "light one"  . COPD (chronic obstructive pulmonary disease) ([place])   . Hypothyroidism   . Type II diabetes mellitus ([place])   . Anemia     previous blood transfusions  . GERD (gastroesophageal reflux disease)   . History of stomach ulcers   . Arthritis     "all over"  . Renal insufficiency   . Depression     "light case"  . SIADH (syndrome of inappropriate ADH production) ([place])     [name] 01/10/2015  . Perforated gastric ulcer ([place])   . Gastric stenosis   . Paraesophageal hernia   . Bradycardia     topped BB and amiodarone decreased- junctional rhythm    Past Surgical  History  Procedure Laterality Date  . Tubal ligation    . Colectomy    . Hernia repair  2015  . Esophagogastroduodenoscopy N/A 01/19/2014    Procedure: ESOPHAGOGASTRODUODENOSCOPY (EGD);  Surgeon: Irene Shipper, MD;  Location: [facility];  Service: Endoscopy;  Laterality: N/A;  . Esophagogastroduodenoscopy N/A 01/20/2014    Procedure: ESOPHAGOGASTRODUODENOSCOPY (EGD);  Surgeon: Irene Shipper, MD;  Location: [facility];  Service: Endoscopy;  Laterality: N/A;  . Esophagogastroduodenoscopy N/A 03/19/2014    Procedure: ESOPHAGOGASTRODUODENOSCOPY (EGD);  Surgeon: Milus Banister, MD;  Location: [facility];  Service: Endoscopy;  Laterality: N/A;  . Cholecystectomy open    . Ventral hernia repair  2015    incarcerated ventral hernia ([facility] 09/2013)/notes 01/10/2015  . Cataract extraction w/ intraocular lens  implant, bilateral    . Gastrojejunostomy      hx/notes 01/10/2015  . Laparotomy N/A 01/20/2015    Procedure: EXPLORATORY LAPAROTOMY;  Surgeon: Coralie Keens, MD;  Location: [facility];  Service: General;  Laterality: N/A;  . Lysis of adhesion N/A 01/20/2015    Procedure: LYSIS OF ADHESIONS < 1 HOUR;  Surgeon: Coralie Keens, MD;  Location: [facility];  Service: General;  Laterality: N/A;  . Esophagogastroduodenoscopy N/A 07/08/2015    Procedure: ESOPHAGOGASTRODUODENOSCOPY (EGD);  Surgeon: Doran Stabler, MD;  Location: [facility]COPY;  Service: Endoscopy;  Laterality:  N/A;   Family History  Problem Relation Age of Onset  . Stroke Mother   . Heart attack Neg Hx   . Hypertension Mother   . Diabetes Brother    Social History  Substance Use Topics  . Smoking status: Never Smoker   . Smokeless tobacco: Former Systems developer    Types: Snuff  . Alcohol Use: No   OB History    No data available     Review of Systems 10 Systems reviewed and all are negative for acute change except as noted in the HPI.  Allergies  Review of patient's allergies indicates no known allergies.  Home Medications    Prior to Admission medications   Medication Sig Start Date End Date Taking? Authorizing Provider  acetaminophen (TYLENOL) 325 MG tablet Take 325 mg by mouth every 6 (six) hours as needed for mild pain or fever.     Historical Provider, MD  ADVAIR DISKUS 250-50 MCG/DOSE AEPB INHALE 1 PUFF INTO THE LUNGS 2 (TWO) TIMES DAILY AS NEEDED FOR SHORTNESS OF BREATH 12/01/14   Historical Provider, MD  albuterol (PROVENTIL HFA;VENTOLIN HFA) 108 (90 Base) MCG/ACT inhaler Inhale 2 puffs into the lungs every 6 (six) hours as needed for wheezing or shortness of breath. 06/04/15   Gildardo Cranker, DO  amiodarone (PACERONE) 100 MG tablet Take 1 tablet (100 mg total) by mouth daily. 08/05/15   Florinda Marker, MD  amLODipine (NORVASC) 10 MG tablet TAKE 1 TABLET BY MOUTH DAILY FOR HYPERTENSION 08/09/15   Gildardo Cranker, DO  amoxicillin-clavulanate (AUGMENTIN) 500-125 MG tablet Take 1 tablet (500 mg total) by mouth 2 (two) times daily. 09/05/15   Jani Gravel, MD  apixaban (ELIQUIS) 2.5 MG TABS tablet Take 1 tablet (2.5 mg total) by mouth 2 (two) times daily. 08/11/15   Gildardo Cranker, DO  atorvastatin (LIPITOR) 10 MG tablet Take one tablet by mouth once daily 09/03/15   Gildardo Cranker, DO  b complex vitamins tablet Take 1 tablet by mouth daily.     Historical Provider, MD  calcium-vitamin D (OSCAL WITH D) 500-200 MG-UNIT per tablet Take 1 tablet by mouth daily with breakfast.     Historical Provider, MD  carvedilol (COREG) 3.125 MG tablet Take 1 tablet (3.125 mg total) by mouth 2 (two) times daily with a meal. 09/05/15   Jani Gravel, MD  chlorhexidine (PERIDEX) 0.12 % solution Use as directed 15 mLs in the mouth or throat 2 (two) times daily.    Historical Provider, MD  [employer] GAS RELIEF 80 MG chewable tablet CHEW 1 TABLET BY MOUTH 4 TIMES A DAY AS NEEDED FOR FLATULENCE 06/28/15   Gildardo Cranker, DO  diclofenac sodium (VOLTAREN) 1 % GEL Apply 2 g topically daily as needed (for pain).     Historical Provider, MD  feeding supplement (BOOST /  RESOURCE BREEZE) LIQD Take 1 Container by mouth 3 (three) times daily between meals. 01/27/15   Shanker Kristeen Mans, MD  fluticasone (FLONASE) 50 MCG/ACT nasal spray Place 2 sprays into both nostrils daily. 08/07/14   Gildardo Cranker, DO  gabapentin (NEURONTIN) 300 MG capsule Take 2 caps po in AM and qhs, take 1 cap daily at [facility] 08/06/15   Gildardo Cranker, DO  hydrALAZINE (APRESOLINE) 50 MG tablet Take 1 tablet (50 mg total) by mouth 4 (four) times daily. 09/05/15   Jani Gravel, MD  isosorbide mononitrate (IMDUR) 30 MG 24 hr tablet TAKE 1 TABLET (30 MG TOTAL) BY MOUTH DAILY. 06/22/15   Historical Provider, MD  lactulose ([place])  10 GM/15ML solution Take 15 mLs (10 g total) by mouth daily as needed for mild constipation. 06/02/14   Mahima Bubba Camp, MD  latanoprost (XALATAN) 0.005 % ophthalmic solution Place 1 drop into both eyes at bedtime. 06/02/14   Blanchie Serve, MD  levothyroxine (SYNTHROID, LEVOTHROID) 88 MCG tablet Take 1 tablet (88 mcg total) by mouth daily before breakfast. 09/05/15   Jani Gravel, MD  metoCLOPramide (REGLAN) 5 MG tablet Take 1 tablet (5 mg total) by mouth 4 (four) times daily -  before meals and at bedtime. 09/05/15   Jani Gravel, MD  ondansetron (ZOFRAN) 4 MG tablet Take 1 tablet (4 mg total) by mouth every 8 (eight) hours as needed for nausea or vomiting. 09/07/15   [name], NP  pantoprazole (PROTONIX) 40 MG tablet TAKE 1 TABLET BY MOUTH TWICE A DAY 08/11/15   Gildardo Cranker, DO  Polyethyl Glycol-Propyl Glycol 0.4-0.3 % SOLN Place 1 drop into both eyes 4 (four) times daily.     Historical Provider, MD  polyethylene glycol (MIRALAX / GLYCOLAX) packet Take 17 g by mouth 2 (two) times daily.     Historical Provider, MD  RESTASIS 0.05 % ophthalmic emulsion USE 1 DROP INTO BOTH EYES TWICE DAILY 10/19/14   Gildardo Cranker, DO  sertraline (ZOLOFT) 100 MG tablet Take one tablet by mouth once daily for depression 09/03/15   Gildardo Cranker, DO  sodium chloride 1 g tablet TAKE 1 TABLET BY MOUTH THREE TIMES  DAILY 05/19/15   Gildardo Cranker, DO  traMADol (ULTRAM) 50 MG tablet Take 50 mg by mouth 2 (two) times daily as needed for moderate pain.     Historical Provider, MD   BP 181/86 mmHg  Pulse 90  Temp(Src) 99.9 F (37.7 C) (Oral)  Resp 22  Ht 5\' 6"  (1.676 m)  Wt 150 lb (68.04 kg)  BMI 24.22 kg/m2  SpO2 92% Physical Exam  Constitutional: She is oriented to person, place, and time. She appears well-developed and well-nourished. No distress.  HENT:  Head: Normocephalic and atraumatic.  Nose: Nose normal.  Mouth/Throat: Oropharynx is clear and moist. No oropharyngeal exudate.  Eyes: Conjunctivae and EOM are normal. Pupils are equal, round, and reactive to light. No scleral icterus.  Neck: Normal range of motion. Neck supple. No JVD present. No tracheal deviation present. No thyromegaly present.  Cardiovascular: Normal rate, regular rhythm and normal heart sounds.  Exam reveals no gallop and no friction rub.   No murmur heard. Pulmonary/Chest: Effort normal and breath sounds normal. No respiratory distress. She has no wheezes. She exhibits no tenderness.  Abdominal: Soft. Bowel sounds are normal. She exhibits distension. She exhibits no mass. There is tenderness. There is no rebound and no guarding. A hernia is present.  Diffuse mild tenderness; Periumbilical hernia, easily reducible; midline abdominal surgical scar well healed  Musculoskeletal: Normal range of motion. She exhibits edema. She exhibits no tenderness.  Bilateral LE edema  Lymphadenopathy:    She has no cervical adenopathy.  Neurological: She is alert and oriented to person, place, and time. No cranial nerve deficit. She exhibits normal muscle tone.  Skin: Skin is warm and dry. No rash noted. No erythema. No pallor.  Nursing note and vitals reviewed.   ED Course  Procedures (including critical care time) DIAGNOSTIC STUDIES: Oxygen Saturation is 92% on RA, low by my interpretation.    COORDINATION OF CARE: 11:17  PM-Discussed treatment plan which includes labs, EKG, and x-ray with pt at bedside and pt agreed to plan.  Labs Review Labs Reviewed  CBC WITH DIFFERENTIAL/PLATELET - Abnormal; Notable for the following:    WBC 18.7 (*)    RBC 3.22 (*)    Hemoglobin 10.8 (*)    HCT 31.1 (*)    RDW 18.2 (*)    Neutro Abs 8.7 (*)    Eosinophils Absolute 8.2 (*)    All other components within normal limits  COMPREHENSIVE METABOLIC PANEL - Abnormal; Notable for the following:    Potassium 5.3 (*)    Glucose, Bld 115 (*)    Creatinine, Ser 1.52 (*)    AST 43 (*)    GFR calc non Af Amer 29 (*)    GFR calc Af Amer 34 (*)    All other components within normal limits  LIPASE, BLOOD - Abnormal; Notable for the following:    Lipase 81 (*)    All other components within normal limits  URINALYSIS, ROUTINE W REFLEX MICROSCOPIC (NOT AT [facility]) - Abnormal; Notable for the following:    Protein, ur 100 (*)    All other components within normal limits  URINE MICROSCOPIC-ADD ON - Abnormal; Notable for the following:    Squamous Epithelial / LPF 0-5 (*)    Bacteria, UA RARE (*)    All other components within normal limits  I-STAT CHEM 8, ED - Abnormal; Notable for the following:    BUN 25 (*)    Creatinine, Ser 1.50 (*)    Glucose, Bld 108 (*)    Hemoglobin 11.9 (*)    HCT 35.0 (*)    All other components within normal limits  URINE CULTURE  CULTURE, BLOOD (ROUTINE X 2)  CULTURE, BLOOD (ROUTINE X 2)  PATHOLOGIST SMEAR REVIEW  I-STAT CG4 LACTIC ACID, ED    Imaging Review Dg Chest 2 View  09/11/2015  CLINICAL DATA:  18-year-old female with shortness of breath and tachypnea EXAM: CHEST  2 VIEW COMPARISON:  Chest radiograph dated 08/26/2015 FINDINGS: Two views of the chest demonstrate a small left pleural effusion, new from prior study. There is associated partial compressive atelectasis of the left lung base. Pneumonia is not excluded. The right lung is clear. There is no pneumothorax. Top-normal cardiac  silhouette. There is degenerative changes of the shoulders. No acute osseous pathology. IMPRESSION: Small left pleural effusion with left lung base atelectasis versus infiltrate. Electronically Signed   By: Anner Crete M.D.   On: 09/11/2015 23:56   Ct Abdomen Pelvis W Contrast  09/12/2015  CLINICAL DATA:  82 year old female with abdominal pain. History of small-bowel obstruction. EXAM: CT ABDOMEN AND PELVIS WITH CONTRAST TECHNIQUE: Multidetector CT imaging of the abdomen and pelvis was performed using the standard protocol following bolus administration of intravenous contrast. CONTRAST:  87mL ISOVUE-300 IOPAMIDOL (ISOVUE-300) INJECTION 61% COMPARISON:  CT dated 08/26/2015 is FINDINGS: There is a partially visualized small left pleural effusion. Patchy areas of airspace density at the lung bases as well as multiple ground-glass and nodular density at the left lung base are concerning for pneumonia. Clinical correlation is recommended. There is coronary vascular calcification. No intra-abdominal free air.  No free fluid. Cholecystectomy. Multiple small scattered hepatic hypodense lesions of measuring up to 9 mm are not well characterized but likely represent cysts or hemangioma. Cholecystectomy. The pancreas, and the adrenal glands appear unremarkable. Subcentimeter splenic hypodense lesion is not characterized but likely represents a cyst or hemangioma. There is no hydronephrosis on either side. The visualized ureters appear grossly unremarkable. The urinary bladder is partially distended. There is mild haziness of  the bladder wall. Correlation with urinalysis recommended to exclude cystitis. The uterus is grossly unremarkable. There is mild thickened appearance of the distal esophagus which may represent esophagitis or esophageal irritation related to reflux. The stomach is distended with gastric contents, likely related to the reported chronically impaired gastric emptying. There is thickened appearance of  the gastric antrum. There is mild haziness of the proximal duodenum concerning for duodenitis. There is postsurgical changes of bowel with duodenojejunal anastomosis. There is no evidence of small-bowel obstruction. There are scattered sigmoid and colonic diverticula without active inflammatory changes. There is postsurgical changes of partial sigmoid resection with anastomotic suture. There is advanced aortoiliac atherosclerotic disease. The origins of the celiac axis, SMA, and IMA appear patent. No portal venous gas identified. There is no adenopathy. There is midline vertical anterior pelvic wall incisional scar. There is a 5 cm defect in the anterior peritoneal wall along the surgical incision with protrusion of a short segment of the colon. A small fat containing left para incisional hernia noted. There is diffuse stranding of the subcutaneous fat. No fluid collection or hematoma. There is osteopenia with multilevel degenerative changes of the spine. T11 compression deformity similar to prior study. No acute fracture. IMPRESSION: Small left pleural effusion with findings concerning for bibasilar pneumonia. Clinical correlation and follow-up recommended. Severe gastric distention likely related to chronic gastric dysmotility or partial gastric outlet obstruction. Clinical correlation is recommended. There has been interval increase in the gastric distension compared the prior CT. Mild haziness of the proximal duodenum concerning for duodenitis. No evidence of small-bowel obstruction. Esophagitis. Colonic diverticulosis without active inflammatory changes. Electronically Signed   By: Anner Crete M.D.   On: 09/12/2015 00:33   I have personally reviewed and evaluated these images and lab results as part of my medical decision-making.   EKG Interpretation None      MDM   Final diagnoses:  None   Patient presents to the ED for severe diffuse abdominal pain and distension.  She has a history of GERD  and SBO. She states she has had diarrhea as well, which makes it far less likely to be SBO.  Due to age, co-morbities and multiple surgeries, I believe CT is warranted for further evaluation. She was given fentanyl for pain control.   CT reveals LLL PNA, likely cause of her borderline fever and hypoxia.  Also worsening gastric distension.  Blood cultures drawn, patient admitted to Dr. Myna Hidalgo for further care.  She was given tylenol, vanc, zosyn for treatment.  Patient may require NG tube to relief gastric distension.   I personally performed the services described in this documentation, which was scribed in my presence. The recorded information has been reviewed and is accurate.       Everlene Balls, MD 09/12/15 [id number]

## 2015-09-11 NOTE — ED Notes (Signed)
Patient complains of two days ago of abdominal pain. Patient has had nausea and vomiting. Patient states she has not have had a good bowel movement in a few days. Two weeks patient states she had a bowel obstruction

## 2015-09-11 NOTE — ED Notes (Signed)
Patient transported to CT 

## 2015-09-11 NOTE — ED Notes (Signed)
Bed: [id number] Expected date:  Expected time:  Means of arrival:  Comments: EMS 82yo F abd pain / recent SBO

## 2015-09-12 ENCOUNTER — Encounter (HOSPITAL_COMMUNITY): Payer: Self-pay | Admitting: Family Medicine

## 2015-09-12 ENCOUNTER — Inpatient Hospital Stay (HOSPITAL_COMMUNITY): Payer: Medicare Other

## 2015-09-12 ENCOUNTER — Other Ambulatory Visit: Payer: Self-pay | Admitting: Internal Medicine

## 2015-09-12 DIAGNOSIS — Z931 Gastrostomy status: Secondary | ICD-10-CM | POA: Diagnosis not present

## 2015-09-12 DIAGNOSIS — Z48815 Encounter for surgical aftercare following surgery on the digestive system: Secondary | ICD-10-CM | POA: Diagnosis not present

## 2015-09-12 DIAGNOSIS — J449 Chronic obstructive pulmonary disease, unspecified: Secondary | ICD-10-CM | POA: Diagnosis present

## 2015-09-12 DIAGNOSIS — E1169 Type 2 diabetes mellitus with other specified complication: Secondary | ICD-10-CM | POA: Diagnosis not present

## 2015-09-12 DIAGNOSIS — Z7901 Long term (current) use of anticoagulants: Secondary | ICD-10-CM | POA: Diagnosis not present

## 2015-09-12 DIAGNOSIS — R633 Feeding difficulties: Secondary | ICD-10-CM | POA: Diagnosis not present

## 2015-09-12 DIAGNOSIS — E872 Acidosis: Secondary | ICD-10-CM | POA: Diagnosis present

## 2015-09-12 DIAGNOSIS — E876 Hypokalemia: Secondary | ICD-10-CM | POA: Diagnosis present

## 2015-09-12 DIAGNOSIS — E875 Hyperkalemia: Secondary | ICD-10-CM | POA: Diagnosis present

## 2015-09-12 DIAGNOSIS — K228 Other specified diseases of esophagus: Secondary | ICD-10-CM | POA: Diagnosis not present

## 2015-09-12 DIAGNOSIS — I739 Peripheral vascular disease, unspecified: Secondary | ICD-10-CM | POA: Diagnosis not present

## 2015-09-12 DIAGNOSIS — R109 Unspecified abdominal pain: Secondary | ICD-10-CM | POA: Diagnosis not present

## 2015-09-12 DIAGNOSIS — E1143 Type 2 diabetes mellitus with diabetic autonomic (poly)neuropathy: Secondary | ICD-10-CM | POA: Diagnosis present

## 2015-09-12 DIAGNOSIS — N179 Acute kidney failure, unspecified: Secondary | ICD-10-CM | POA: Diagnosis present

## 2015-09-12 DIAGNOSIS — I130 Hypertensive heart and chronic kidney disease with heart failure and stage 1 through stage 4 chronic kidney disease, or unspecified chronic kidney disease: Secondary | ICD-10-CM | POA: Diagnosis present

## 2015-09-12 DIAGNOSIS — I129 Hypertensive chronic kidney disease with stage 1 through stage 4 chronic kidney disease, or unspecified chronic kidney disease: Secondary | ICD-10-CM | POA: Diagnosis not present

## 2015-09-12 DIAGNOSIS — Z823 Family history of stroke: Secondary | ICD-10-CM | POA: Diagnosis not present

## 2015-09-12 DIAGNOSIS — Z9841 Cataract extraction status, right eye: Secondary | ICD-10-CM | POA: Diagnosis not present

## 2015-09-12 DIAGNOSIS — I252 Old myocardial infarction: Secondary | ICD-10-CM | POA: Diagnosis not present

## 2015-09-12 DIAGNOSIS — I480 Paroxysmal atrial fibrillation: Secondary | ICD-10-CM | POA: Diagnosis present

## 2015-09-12 DIAGNOSIS — R1084 Generalized abdominal pain: Secondary | ICD-10-CM | POA: Diagnosis present

## 2015-09-12 DIAGNOSIS — E1122 Type 2 diabetes mellitus with diabetic chronic kidney disease: Secondary | ICD-10-CM | POA: Diagnosis not present

## 2015-09-12 DIAGNOSIS — Y95 Nosocomial condition: Secondary | ICD-10-CM | POA: Diagnosis not present

## 2015-09-12 DIAGNOSIS — I5032 Chronic diastolic (congestive) heart failure: Secondary | ICD-10-CM | POA: Diagnosis present

## 2015-09-12 DIAGNOSIS — Z8249 Family history of ischemic heart disease and other diseases of the circulatory system: Secondary | ICD-10-CM | POA: Diagnosis not present

## 2015-09-12 DIAGNOSIS — Z79899 Other long term (current) drug therapy: Secondary | ICD-10-CM | POA: Diagnosis not present

## 2015-09-12 DIAGNOSIS — Z8711 Personal history of peptic ulcer disease: Secondary | ICD-10-CM | POA: Diagnosis not present

## 2015-09-12 DIAGNOSIS — F329 Major depressive disorder, single episode, unspecified: Secondary | ICD-10-CM | POA: Diagnosis present

## 2015-09-12 DIAGNOSIS — R933 Abnormal findings on diagnostic imaging of other parts of digestive tract: Secondary | ICD-10-CM | POA: Diagnosis not present

## 2015-09-12 DIAGNOSIS — Z431 Encounter for attention to gastrostomy: Secondary | ICD-10-CM | POA: Diagnosis not present

## 2015-09-12 DIAGNOSIS — K219 Gastro-esophageal reflux disease without esophagitis: Secondary | ICD-10-CM | POA: Diagnosis present

## 2015-09-12 DIAGNOSIS — E1165 Type 2 diabetes mellitus with hyperglycemia: Secondary | ICD-10-CM | POA: Diagnosis present

## 2015-09-12 DIAGNOSIS — K3184 Gastroparesis: Secondary | ICD-10-CM | POA: Diagnosis present

## 2015-09-12 DIAGNOSIS — M86171 Other acute osteomyelitis, right ankle and foot: Secondary | ICD-10-CM | POA: Diagnosis present

## 2015-09-12 DIAGNOSIS — K5730 Diverticulosis of large intestine without perforation or abscess without bleeding: Secondary | ICD-10-CM | POA: Diagnosis not present

## 2015-09-12 DIAGNOSIS — Z7951 Long term (current) use of inhaled steroids: Secondary | ICD-10-CM | POA: Diagnosis not present

## 2015-09-12 DIAGNOSIS — Z87891 Personal history of nicotine dependence: Secondary | ICD-10-CM | POA: Diagnosis not present

## 2015-09-12 DIAGNOSIS — Z4682 Encounter for fitting and adjustment of non-vascular catheter: Secondary | ICD-10-CM | POA: Diagnosis not present

## 2015-09-12 DIAGNOSIS — Z66 Do not resuscitate: Secondary | ICD-10-CM | POA: Diagnosis present

## 2015-09-12 DIAGNOSIS — N183 Chronic kidney disease, stage 3 (moderate): Secondary | ICD-10-CM | POA: Diagnosis not present

## 2015-09-12 DIAGNOSIS — Z8673 Personal history of transient ischemic attack (TIA), and cerebral infarction without residual deficits: Secondary | ICD-10-CM | POA: Diagnosis not present

## 2015-09-12 DIAGNOSIS — E878 Other disorders of electrolyte and fluid balance, not elsewhere classified: Secondary | ICD-10-CM | POA: Diagnosis present

## 2015-09-12 DIAGNOSIS — N2889 Other specified disorders of kidney and ureter: Secondary | ICD-10-CM | POA: Diagnosis not present

## 2015-09-12 DIAGNOSIS — K311 Adult hypertrophic pyloric stenosis: Secondary | ICD-10-CM | POA: Diagnosis present

## 2015-09-12 DIAGNOSIS — E1151 Type 2 diabetes mellitus with diabetic peripheral angiopathy without gangrene: Secondary | ICD-10-CM | POA: Diagnosis not present

## 2015-09-12 DIAGNOSIS — Z961 Presence of intraocular lens: Secondary | ICD-10-CM | POA: Diagnosis present

## 2015-09-12 DIAGNOSIS — D638 Anemia in other chronic diseases classified elsewhere: Secondary | ICD-10-CM | POA: Diagnosis not present

## 2015-09-12 DIAGNOSIS — K3189 Other diseases of stomach and duodenum: Secondary | ICD-10-CM | POA: Diagnosis not present

## 2015-09-12 DIAGNOSIS — E039 Hypothyroidism, unspecified: Secondary | ICD-10-CM | POA: Diagnosis present

## 2015-09-12 DIAGNOSIS — L97903 Non-pressure chronic ulcer of unspecified part of unspecified lower leg with necrosis of muscle: Secondary | ICD-10-CM | POA: Diagnosis not present

## 2015-09-12 DIAGNOSIS — K2980 Duodenitis without bleeding: Secondary | ICD-10-CM | POA: Diagnosis present

## 2015-09-12 DIAGNOSIS — E785 Hyperlipidemia, unspecified: Secondary | ICD-10-CM | POA: Diagnosis present

## 2015-09-12 DIAGNOSIS — R001 Bradycardia, unspecified: Secondary | ICD-10-CM | POA: Diagnosis not present

## 2015-09-12 DIAGNOSIS — K9189 Other postprocedural complications and disorders of digestive system: Secondary | ICD-10-CM | POA: Diagnosis not present

## 2015-09-12 DIAGNOSIS — R531 Weakness: Secondary | ICD-10-CM | POA: Diagnosis not present

## 2015-09-12 DIAGNOSIS — R0602 Shortness of breath: Secondary | ICD-10-CM | POA: Diagnosis not present

## 2015-09-12 DIAGNOSIS — E43 Unspecified severe protein-calorie malnutrition: Secondary | ICD-10-CM | POA: Diagnosis present

## 2015-09-12 DIAGNOSIS — Z833 Family history of diabetes mellitus: Secondary | ICD-10-CM | POA: Diagnosis not present

## 2015-09-12 DIAGNOSIS — G43A0 Cyclical vomiting, not intractable: Secondary | ICD-10-CM | POA: Diagnosis not present

## 2015-09-12 DIAGNOSIS — J189 Pneumonia, unspecified organism: Secondary | ICD-10-CM | POA: Diagnosis not present

## 2015-09-12 DIAGNOSIS — R110 Nausea: Secondary | ICD-10-CM | POA: Diagnosis not present

## 2015-09-12 DIAGNOSIS — Z9842 Cataract extraction status, left eye: Secondary | ICD-10-CM | POA: Diagnosis not present

## 2015-09-12 DIAGNOSIS — J690 Pneumonitis due to inhalation of food and vomit: Secondary | ICD-10-CM | POA: Diagnosis present

## 2015-09-12 DIAGNOSIS — E1152 Type 2 diabetes mellitus with diabetic peripheral angiopathy with gangrene: Secondary | ICD-10-CM | POA: Diagnosis present

## 2015-09-12 LAB — I-STAT CHEM 8, ED
BUN: 25 mg/dL — ABNORMAL HIGH (ref 6–20)
Calcium, Ion: 1.18 mmol/L (ref 1.13–1.30)
Chloride: 104 mmol/L (ref 101–111)
Creatinine, Ser: 1.5 mg/dL — ABNORMAL HIGH (ref 0.44–1.00)
Glucose, Bld: 108 mg/dL — ABNORMAL HIGH (ref 65–99)
HCT: 35 % — ABNORMAL LOW (ref 36.0–46.0)
Hemoglobin: 11.9 g/dL — ABNORMAL LOW (ref 12.0–15.0)
Potassium: 5 mmol/L (ref 3.5–5.1)
Sodium: 142 mmol/L (ref 135–145)
TCO2: 30 mmol/L (ref 0–100)

## 2015-09-12 LAB — URINALYSIS, ROUTINE W REFLEX MICROSCOPIC
Bilirubin Urine: NEGATIVE
Glucose, UA: NEGATIVE mg/dL
Hgb urine dipstick: NEGATIVE
Ketones, ur: NEGATIVE mg/dL
Leukocytes, UA: NEGATIVE
Nitrite: NEGATIVE
Protein, ur: 100 mg/dL — AB
Specific Gravity, Urine: 1.025 (ref 1.005–1.030)
pH: 6.5 (ref 5.0–8.0)

## 2015-09-12 LAB — CBC WITH DIFFERENTIAL/PLATELET
Basophils Absolute: 0 10*3/uL (ref 0.0–0.1)
Basophils Relative: 0 %
Eosinophils Absolute: 8.2 10*3/uL — ABNORMAL HIGH (ref 0.0–0.7)
Eosinophils Relative: 44 %
HCT: 31.1 % — ABNORMAL LOW (ref 36.0–46.0)
Hemoglobin: 10.8 g/dL — ABNORMAL LOW (ref 12.0–15.0)
Lymphocytes Relative: 6 %
Lymphs Abs: 1.1 10*3/uL (ref 0.7–4.0)
MCH: 33.5 pg (ref 26.0–34.0)
MCHC: 34.7 g/dL (ref 30.0–36.0)
MCV: 96.6 fL (ref 78.0–100.0)
Monocytes Absolute: 0.7 10*3/uL (ref 0.1–1.0)
Monocytes Relative: 4 %
Neutro Abs: 8.7 10*3/uL — ABNORMAL HIGH (ref 1.7–7.7)
Neutrophils Relative %: 46 %
Platelets: 335 10*3/uL (ref 150–400)
RBC: 3.22 MIL/uL — ABNORMAL LOW (ref 3.87–5.11)
RDW: 18.2 % — ABNORMAL HIGH (ref 11.5–15.5)
WBC: 18.7 10*3/uL — ABNORMAL HIGH (ref 4.0–10.5)

## 2015-09-12 LAB — COMPREHENSIVE METABOLIC PANEL
ALT: 24 U/L (ref 14–54)
AST: 43 U/L — ABNORMAL HIGH (ref 15–41)
Albumin: 3.7 g/dL (ref 3.5–5.0)
Alkaline Phosphatase: 59 U/L (ref 38–126)
Anion gap: 9 (ref 5–15)
BUN: 19 mg/dL (ref 6–20)
CO2: 25 mmol/L (ref 22–32)
Calcium: 9.7 mg/dL (ref 8.9–10.3)
Chloride: 105 mmol/L (ref 101–111)
Creatinine, Ser: 1.52 mg/dL — ABNORMAL HIGH (ref 0.44–1.00)
GFR calc Af Amer: 34 mL/min — ABNORMAL LOW (ref >60)
GFR calc non Af Amer: 29 mL/min — ABNORMAL LOW (ref >60)
Glucose, Bld: 115 mg/dL — ABNORMAL HIGH (ref 65–99)
Potassium: 5.3 mmol/L — ABNORMAL HIGH (ref 3.5–5.1)
Sodium: 139 mmol/L (ref 135–145)
Total Bilirubin: 1.2 mg/dL (ref 0.3–1.2)
Total Protein: 7.6 g/dL (ref 6.5–8.1)

## 2015-09-12 LAB — OSMOLALITY, URINE: Osmolality, Ur: 544 mOsm/kg (ref 300–900)

## 2015-09-12 LAB — STREP PNEUMONIAE URINARY ANTIGEN: Strep Pneumo Urinary Antigen: NEGATIVE

## 2015-09-12 LAB — LIPASE, BLOOD: Lipase: 81 U/L — ABNORMAL HIGH (ref 11–51)

## 2015-09-12 LAB — HIV ANTIBODY (ROUTINE TESTING W REFLEX): HIV Screen 4th Generation wRfx: NONREACTIVE

## 2015-09-12 LAB — URINE MICROSCOPIC-ADD ON

## 2015-09-12 LAB — PROCALCITONIN: Procalcitonin: <0.1 ng/mL

## 2015-09-12 LAB — CREATININE, URINE, RANDOM: Creatinine, Urine: 72.31 mg/dL

## 2015-09-12 LAB — I-STAT CG4 LACTIC ACID, ED: Lactic Acid, Venous: 1.52 mmol/L (ref 0.5–2.0)

## 2015-09-12 LAB — SODIUM, URINE, RANDOM: Sodium, Ur: 151 mmol/L

## 2015-09-12 MED ORDER — ONDANSETRON HCL 4 MG PO TABS: 4.0000 mg | ORAL_TABLET | Freq: Three times a day (TID) | ORAL | Status: DC | PRN

## 2015-09-12 MED ORDER — DICLOFENAC SODIUM 1 % TD GEL
2.0000 g | Freq: Every day | TRANSDERMAL | Status: DC | PRN
  Administered 2015-09-12: 2 g via TOPICAL
  Filled 2015-09-12 (×2): qty 100

## 2015-09-12 MED ORDER — ISOSORBIDE MONONITRATE ER 30 MG PO TB24
30.0000 mg | ORAL_TABLET | Freq: Every day | ORAL | Status: DC
  Administered 2015-09-12 – 2015-09-13 (×2): 30 mg via ORAL
  Filled 2015-09-12 (×3): qty 1

## 2015-09-12 MED ORDER — APIXABAN 2.5 MG PO TABS
2.5000 mg | ORAL_TABLET | Freq: Two times a day (BID) | ORAL | Status: DC
  Administered 2015-09-12 (×2): 2.5 mg via ORAL
  Filled 2015-09-12 (×2): qty 1

## 2015-09-12 MED ORDER — FLUTICASONE PROPIONATE 50 MCG/ACT NA SUSP
2.0000 | Freq: Every day | NASAL | Status: DC
  Administered 2015-09-12 – 2015-09-30 (×18): 2 via NASAL
  Filled 2015-09-12: qty 16

## 2015-09-12 MED ORDER — MOMETASONE FURO-FORMOTEROL FUM 200-5 MCG/ACT IN AERO
2.0000 | INHALATION_SPRAY | Freq: Two times a day (BID) | RESPIRATORY_TRACT | Status: DC
  Administered 2015-09-12 – 2015-09-29 (×15): 2 via RESPIRATORY_TRACT
  Filled 2015-09-12: qty 8.8

## 2015-09-12 MED ORDER — ATORVASTATIN CALCIUM 10 MG PO TABS
10.0000 mg | ORAL_TABLET | Freq: Every day | ORAL | Status: DC
  Administered 2015-09-12 – 2015-09-17 (×4): 10 mg via ORAL
  Filled 2015-09-12 (×4): qty 1

## 2015-09-12 MED ORDER — LATANOPROST 0.005 % OP SOLN
1.0000 [drp] | Freq: Every day | OPHTHALMIC | Status: DC
  Administered 2015-09-12 – 2015-09-29 (×18): 1 [drp] via OPHTHALMIC
  Filled 2015-09-12 (×2): qty 2.5

## 2015-09-12 MED ORDER — SODIUM CHLORIDE 0.9 % IV SOLN
INTRAVENOUS | Status: DC
  Administered 2015-09-12: 100 mL/h via INTRAVENOUS

## 2015-09-12 MED ORDER — PIPERACILLIN-TAZOBACTAM 3.375 G IVPB 30 MIN
3.3750 g | Freq: Once | INTRAVENOUS | Status: AC
  Administered 2015-09-12: 3.375 g via INTRAVENOUS
  Filled 2015-09-12: qty 50

## 2015-09-12 MED ORDER — SODIUM CHLORIDE 0.9 % IV SOLN
INTRAVENOUS | Status: AC
  Administered 2015-09-12: 15:00:00 via INTRAVENOUS

## 2015-09-12 MED ORDER — POLYETHYLENE GLYCOL 3350 17 G PO PACK
17.0000 g | PACK | Freq: Two times a day (BID) | ORAL | Status: DC
  Administered 2015-09-12 – 2015-09-13 (×2): 17 g via ORAL
  Filled 2015-09-12 (×2): qty 1

## 2015-09-12 MED ORDER — LACTULOSE 10 GM/15ML PO SOLN: 10.0000 g | Freq: Every day | ORAL | Status: DC | PRN

## 2015-09-12 MED ORDER — SERTRALINE HCL 100 MG PO TABS
100.0000 mg | ORAL_TABLET | Freq: Every day | ORAL | Status: DC
  Administered 2015-09-12 – 2015-09-27 (×14): 100 mg via ORAL
  Filled 2015-09-12 (×14): qty 1

## 2015-09-12 MED ORDER — ALBUTEROL SULFATE (2.5 MG/3ML) 0.083% IN NEBU
2.5000 mg | INHALATION_SOLUTION | Freq: Four times a day (QID) | RESPIRATORY_TRACT | Status: DC | PRN
Start: 2015-09-12 — End: 2015-09-30

## 2015-09-12 MED ORDER — LEVOTHYROXINE SODIUM 88 MCG PO TABS
88.0000 ug | ORAL_TABLET | Freq: Every day | ORAL | Status: DC
  Administered 2015-09-12 – 2015-09-13 (×2): 88 ug via ORAL
  Filled 2015-09-12 (×2): qty 1

## 2015-09-12 MED ORDER — VANCOMYCIN HCL IN DEXTROSE 750-5 MG/150ML-% IV SOLN
750.0000 mg | INTRAVENOUS | Status: DC
  Administered 2015-09-13 – 2015-09-15 (×3): 750 mg via INTRAVENOUS
  Filled 2015-09-12 (×4): qty 150

## 2015-09-12 MED ORDER — SENNOSIDES-DOCUSATE SODIUM 8.6-50 MG PO TABS
1.0000 | ORAL_TABLET | Freq: Two times a day (BID) | ORAL | Status: DC
  Administered 2015-09-12 – 2015-09-13 (×3): 1 via ORAL
  Filled 2015-09-12 (×3): qty 1

## 2015-09-12 MED ORDER — GABAPENTIN 300 MG PO CAPS
600.0000 mg | ORAL_CAPSULE | ORAL | Status: DC
  Administered 2015-09-12 – 2015-09-25 (×13): 600 mg via ORAL
  Filled 2015-09-12 (×14): qty 2

## 2015-09-12 MED ORDER — AMIODARONE HCL 100 MG PO TABS
100.0000 mg | ORAL_TABLET | Freq: Every day | ORAL | Status: DC
  Administered 2015-09-12 – 2015-09-30 (×18): 100 mg via ORAL
  Filled 2015-09-12 (×18): qty 1

## 2015-09-12 MED ORDER — DEXTROSE 5 % IV SOLN
1.0000 g | Freq: Every day | INTRAVENOUS | Status: DC
  Administered 2015-09-12 – 2015-09-14 (×3): 1 g via INTRAVENOUS
  Filled 2015-09-12 (×3): qty 1

## 2015-09-12 MED ORDER — CHLORHEXIDINE GLUCONATE 0.12 % MT SOLN
15.0000 mL | Freq: Two times a day (BID) | OROMUCOSAL | Status: DC
  Administered 2015-09-12 – 2015-09-30 (×33): 15 mL via OROMUCOSAL
  Filled 2015-09-12 (×31): qty 15

## 2015-09-12 MED ORDER — GABAPENTIN 300 MG PO CAPS
300.0000 mg | ORAL_CAPSULE | ORAL | Status: DC
  Administered 2015-09-12 – 2015-09-24 (×10): 300 mg via ORAL
  Filled 2015-09-12 (×12): qty 1

## 2015-09-12 MED ORDER — VANCOMYCIN HCL IN DEXTROSE 1-5 GM/200ML-% IV SOLN
1000.0000 mg | Freq: Once | INTRAVENOUS | Status: AC
  Administered 2015-09-12: 1000 mg via INTRAVENOUS
  Filled 2015-09-12: qty 200

## 2015-09-12 MED ORDER — HYDRALAZINE HCL 50 MG PO TABS
50.0000 mg | ORAL_TABLET | Freq: Four times a day (QID) | ORAL | Status: DC
  Administered 2015-09-12 – 2015-09-13 (×6): 50 mg via ORAL
  Filled 2015-09-12 (×6): qty 1

## 2015-09-12 MED ORDER — CALCIUM CARBONATE-VITAMIN D 500-200 MG-UNIT PO TABS
1.0000 | ORAL_TABLET | Freq: Every day | ORAL | Status: DC
  Administered 2015-09-12 – 2015-09-13 (×2): 1 via ORAL
  Filled 2015-09-12 (×2): qty 1

## 2015-09-12 MED ORDER — POLYVINYL ALCOHOL 1.4 % OP SOLN
1.0000 [drp] | Freq: Four times a day (QID) | OPHTHALMIC | Status: DC
  Administered 2015-09-12 – 2015-09-30 (×69): 1 [drp] via OPHTHALMIC
  Filled 2015-09-12 (×4): qty 15

## 2015-09-12 MED ORDER — ALBUTEROL SULFATE HFA 108 (90 BASE) MCG/ACT IN AERS: 2.0000 | INHALATION_SPRAY | Freq: Four times a day (QID) | RESPIRATORY_TRACT | Status: DC | PRN

## 2015-09-12 MED ORDER — AMLODIPINE BESYLATE 10 MG PO TABS
10.0000 mg | ORAL_TABLET | Freq: Every day | ORAL | Status: DC
  Administered 2015-09-12 – 2015-09-13 (×2): 10 mg via ORAL
  Filled 2015-09-12 (×2): qty 1

## 2015-09-12 MED ORDER — ACETAMINOPHEN 500 MG PO TABS
1000.0000 mg | ORAL_TABLET | Freq: Once | ORAL | Status: AC
  Administered 2015-09-12: 1000 mg via ORAL
  Filled 2015-09-12: qty 2

## 2015-09-12 MED ORDER — ACETAMINOPHEN 325 MG PO TABS
325.0000 mg | ORAL_TABLET | Freq: Four times a day (QID) | ORAL | Status: DC | PRN
  Administered 2015-09-12: 325 mg via ORAL
  Filled 2015-09-12: qty 1

## 2015-09-12 MED ORDER — DEXTROSE 5 % IV SOLN
1.0000 g | Freq: Once | INTRAVENOUS | Status: AC
  Administered 2015-09-12: 1 g via INTRAVENOUS
  Filled 2015-09-12: qty 1

## 2015-09-12 MED ORDER — TAMSULOSIN HCL 0.4 MG PO CAPS
0.4000 mg | ORAL_CAPSULE | Freq: Every day | ORAL | Status: DC
  Administered 2015-09-12 – 2015-09-16 (×4): 0.4 mg via ORAL
  Filled 2015-09-12 (×4): qty 1

## 2015-09-12 MED ORDER — CARVEDILOL 3.125 MG PO TABS
3.1250 mg | ORAL_TABLET | Freq: Two times a day (BID) | ORAL | Status: DC
  Administered 2015-09-12 – 2015-09-29 (×29): 3.125 mg via ORAL
  Filled 2015-09-12 (×29): qty 1

## 2015-09-12 MED ORDER — METOCLOPRAMIDE HCL 10 MG PO TABS
5.0000 mg | ORAL_TABLET | Freq: Three times a day (TID) | ORAL | Status: DC
  Administered 2015-09-12: 5 mg via ORAL
  Filled 2015-09-12: qty 1

## 2015-09-12 MED ORDER — SODIUM CHLORIDE 0.9 % IV BOLUS (SEPSIS)
500.0000 mL | Freq: Once | INTRAVENOUS | Status: AC
  Administered 2015-09-12: 500 mL via INTRAVENOUS

## 2015-09-12 MED ORDER — PANTOPRAZOLE SODIUM 40 MG PO TBEC
40.0000 mg | DELAYED_RELEASE_TABLET | Freq: Two times a day (BID) | ORAL | Status: DC
  Administered 2015-09-12 – 2015-09-13 (×3): 40 mg via ORAL
  Filled 2015-09-12 (×3): qty 1

## 2015-09-12 MED ORDER — TRAMADOL HCL 50 MG PO TABS: 50.0000 mg | ORAL_TABLET | Freq: Two times a day (BID) | ORAL | Status: DC | PRN

## 2015-09-12 MED ORDER — CYCLOSPORINE 0.05 % OP EMUL
1.0000 [drp] | Freq: Two times a day (BID) | OPHTHALMIC | Status: DC
  Administered 2015-09-12 – 2015-09-30 (×36): 1 [drp] via OPHTHALMIC
  Filled 2015-09-12 (×40): qty 1

## 2015-09-12 MED ORDER — BOOST / RESOURCE BREEZE PO LIQD
1.0000 | Freq: Three times a day (TID) | ORAL | Status: DC
Start: 2015-09-12 — End: 2015-09-13
  Administered 2015-09-12 (×2): 1 via ORAL

## 2015-09-12 NOTE — Progress Notes (Signed)
Pt had MRSA screening 5/2 and it was negative.  Therefore, per protocol, we will not retest or place person on precautions.

## 2015-09-12 NOTE — H&P (Signed)
History and Physical    Shannon [name] FUX:[id number] DOB: 05/07/1923 DOA: 09/11/2015  PCP: Kirt Boys, DO   Patient coming from: Home  Chief Complaint: Abdominal pain, N/V  HPI: Shannon [name] is a medically complex 82 y.o. female with medical history significant for hypertension, hyperlipidemia, hypothyroidism, peripheral arterial disease with ischemic toe complicated by osteomyelitis, and chronic gastric distention with recurrent nausea and vomiting who presents to the ED for evaluation of abdominal pain with nausea and vomiting of 2 days' duration. Patient had a recent admission, just discharged on 09/05/2015 following management of abdominal pain with nausea and vomiting. She had an NG tube placed at that time and was started on liquid diet, essentially improving spontaneously and tolerating full diet. She was also noted to have an ischemic third toe on the right foot secondary to PAD and workup revealed underlying osteomyelitis. Surgery was advised for management of this, however patient declined. She was discharged with a one-month course of Augmentin. She had initially done well back at home before the insidious development of recurrent abdominal pain with nausea and vomiting. She reports the vomitus as nonbloody and nonbilious. There's been no diarrhea; in fact she has not had a bowel movement in 2-3 days. She denies chest pain or palpitations, but does endorse dyspnea with minimal exertion. She denies any significant cough.  ED Course: Upon arrival to the ED, patient is found to have temperature elevated to 37.9 C, saturating low 90s on room air at rest, hypertensive to 180/85, and with normal heart rate. Chest x-ray each interspace small left pleural effusion and a infiltrate in the left base. CT of the abdomen and pelvis was obtained and notable for severe gastric distention, likely due to chronic dysmotility or partial gastric outlet obstruction. Also noted on CT is mild haziness around the  duodenum consistent with duodenitis. The lung bases are visualized on the CT and findings consistent with bibasilar pneumonia. Chemistry panels notable for a potassium of 5.3 and creatinine 1.52, up from 0.982 months prior. CBC features a leukocytosis to 18,700 and hemoglobin of 10.8 with normal MCV. Lactic acid is reassuring at 1.52 and urinalysis is unremarkable. A 1 L bolus of normal saline was administered and blood and urine cultures were obtained. Patient was started on empiric vancomycin and Zosyn for suspected healthcare associated pneumonia. Symptomatic care was provided with Tylenol, fentanyl, and Zofran. Patient remained hemodynamically stable in the emergency department and will be admitted to telemetry for ongoing evaluation and management of HCAP, abdominal pain, nausea, and vomiting, with acute kidney injury and mild hyperkalemia.  Review of Systems:  All other systems reviewed and apart from HPI, are negative.  Past Medical History  Diagnosis Date  . Pneumonia   . Hypertension   . Small bowel obstruction (HCC)     "I don't know how many" (01/11/2015)  . Ventral hernia with bowel obstruction   . Asthma   . Hyperlipidemia   . Stroke (HCC)     "light one"  . COPD (chronic obstructive pulmonary disease) (HCC)   . Hypothyroidism   . Type II diabetes mellitus (HCC)   . Anemia     previous blood transfusions  . GERD (gastroesophageal reflux disease)   . History of stomach ulcers   . Arthritis     "all over"  . Renal insufficiency   . Depression     "light case"  . SIADH (syndrome of inappropriate ADH production) (HCC)     [name] 01/10/2015  . Perforated gastric ulcer (HCC)   .  Gastric stenosis   . Paraesophageal hernia   . Bradycardia     topped BB and amiodarone decreased- junctional rhythm     Past Surgical History  Procedure Laterality Date  . Tubal ligation    . Colectomy    . Hernia repair  2015  . Esophagogastroduodenoscopy N/A 01/19/2014    Procedure:  ESOPHAGOGASTRODUODENOSCOPY (EGD);  Surgeon: Hilarie Fredrickson, MD;  Location: [facility];  Service: Endoscopy;  Laterality: N/A;  . Esophagogastroduodenoscopy N/A 01/20/2014    Procedure: ESOPHAGOGASTRODUODENOSCOPY (EGD);  Surgeon: Hilarie Fredrickson, MD;  Location: [facility];  Service: Endoscopy;  Laterality: N/A;  . Esophagogastroduodenoscopy N/A 03/19/2014    Procedure: ESOPHAGOGASTRODUODENOSCOPY (EGD);  Surgeon: Rachael Fee, MD;  Location: [facility];  Service: Endoscopy;  Laterality: N/A;  . Cholecystectomy open    . Ventral hernia repair  2015    incarcerated ventral hernia ([facility] 09/2013)/notes 01/10/2015  . Cataract extraction w/ intraocular lens  implant, bilateral    . Gastrojejunostomy      hx/notes 01/10/2015  . Laparotomy N/A 01/20/2015    Procedure: EXPLORATORY LAPAROTOMY;  Surgeon: Abigail Miyamoto, MD;  Location: [facility] OR;  Service: General;  Laterality: N/A;  . Lysis of adhesion N/A 01/20/2015    Procedure: LYSIS OF ADHESIONS < 1 HOUR;  Surgeon: Abigail Miyamoto, MD;  Location: [facility] OR;  Service: General;  Laterality: N/A;  . Esophagogastroduodenoscopy N/A 07/08/2015    Procedure: ESOPHAGOGASTRODUODENOSCOPY (EGD);  Surgeon: Sherrilyn Rist, MD;  Location: [facility] ENDOSCOPY;  Service: Endoscopy;  Laterality: N/A;     reports that she has never smoked. She has quit using smokeless tobacco. Her smokeless tobacco use included Snuff. She reports that she does not drink alcohol or use illicit drugs.  No Known Allergies  Family History  Problem Relation Age of Onset  . Stroke Mother   . Heart attack Neg Hx   . Hypertension Mother   . Diabetes Brother      Prior to Admission medications   Medication Sig Start Date End Date Taking? Authorizing Provider  acetaminophen (TYLENOL) 325 MG tablet Take 325 mg by mouth every 6 (six) hours as needed for mild pain or fever.    Yes Historical Provider, MD  ADVAIR DISKUS 250-50 MCG/DOSE AEPB INHALE 1 PUFF INTO THE LUNGS 2 (TWO) TIMES DAILY AS NEEDED  FOR SHORTNESS OF BREATH 12/01/14  Yes Historical Provider, MD  albuterol (PROVENTIL HFA;VENTOLIN HFA) 108 (90 Base) MCG/ACT inhaler Inhale 2 puffs into the lungs every 6 (six) hours as needed for wheezing or shortness of breath. 06/04/15  Yes Kirt Boys, DO  amiodarone (PACERONE) 100 MG tablet Take 1 tablet (100 mg total) by mouth daily. 08/05/15  Yes Alexa Lucrezia Starch, MD  amLODipine (NORVASC) 10 MG tablet TAKE 1 TABLET BY MOUTH DAILY FOR HYPERTENSION 08/09/15  Yes Kirt Boys, DO  amoxicillin-clavulanate (AUGMENTIN) 500-125 MG tablet Take 1 tablet (500 mg total) by mouth 2 (two) times daily. 09/05/15  Yes Pearson Grippe, MD  apixaban (ELIQUIS) 2.5 MG TABS tablet Take 1 tablet (2.5 mg total) by mouth 2 (two) times daily. 08/11/15  Yes Kirt Boys, DO  atorvastatin (LIPITOR) 10 MG tablet Take one tablet by mouth once daily 09/03/15  Yes Kirt Boys, DO  b complex vitamins tablet Take 1 tablet by mouth daily.    Yes Historical Provider, MD  calcium-vitamin D (OSCAL WITH D) 500-200 MG-UNIT per tablet Take 1 tablet by mouth daily with breakfast.    Yes Historical Provider, MD  carvedilol (COREG) 3.125 MG  tablet Take 1 tablet (3.125 mg total) by mouth 2 (two) times daily with a meal. 09/05/15  Yes Pearson Grippe, MD  chlorhexidine (PERIDEX) 0.12 % solution Use as directed 15 mLs in the mouth or throat 2 (two) times daily.   Yes Historical Provider, MD  [employer] GAS RELIEF 80 MG chewable tablet CHEW 1 TABLET BY MOUTH 4 TIMES A DAY AS NEEDED FOR FLATULENCE 06/28/15  Yes Kirt Boys, DO  diclofenac sodium (VOLTAREN) 1 % GEL Apply 2 g topically daily as needed (for pain).    Yes Historical Provider, MD  feeding supplement (BOOST / RESOURCE BREEZE) LIQD Take 1 Container by mouth 3 (three) times daily between meals. 01/27/15  Yes Shanker Levora Dredge, MD  fluticasone (FLONASE) 50 MCG/ACT nasal spray Place 2 sprays into both nostrils daily. 08/07/14  Yes Kirt Boys, DO  gabapentin (NEURONTIN) 300 MG capsule Take 2 caps po in AM and qhs,  take 1 cap daily at [facility] Patient taking differently: Take 300-600 mg by mouth 3 (three) times daily. Take 2 caps po in AM and qhs, take 1 cap daily at [facility] 08/06/15  Yes Kirt Boys, DO  hydrALAZINE (APRESOLINE) 50 MG tablet Take 1 tablet (50 mg total) by mouth 4 (four) times daily. 09/05/15  Yes Pearson Grippe, MD  isosorbide mononitrate (IMDUR) 30 MG 24 hr tablet TAKE 1 TABLET (30 MG TOTAL) BY MOUTH DAILY. 06/22/15  Yes Historical Provider, MD  lactulose (CHRONULAC) 10 GM/15ML solution Take 15 mLs (10 g total) by mouth daily as needed for mild constipation. 06/02/14  Yes Mahima Pandey, MD  latanoprost (XALATAN) 0.005 % ophthalmic solution Place 1 drop into both eyes at bedtime. 06/02/14  Yes Mahima Glade Lloyd, MD  levothyroxine (SYNTHROID, LEVOTHROID) 88 MCG tablet Take 1 tablet (88 mcg total) by mouth daily before breakfast. 09/05/15  Yes Pearson Grippe, MD  metoCLOPramide (REGLAN) 5 MG tablet Take 1 tablet (5 mg total) by mouth 4 (four) times daily -  before meals and at bedtime. 09/05/15  Yes Pearson Grippe, MD  ondansetron (ZOFRAN) 4 MG tablet Take 1 tablet (4 mg total) by mouth every 8 (eight) hours as needed for nausea or vomiting. 09/07/15  Yes Sharon Seller, NP  pantoprazole (PROTONIX) 40 MG tablet TAKE 1 TABLET BY MOUTH TWICE A DAY 08/11/15  Yes Kirt Boys, DO  Polyethyl Glycol-Propyl Glycol 0.4-0.3 % SOLN Place 1 drop into both eyes 4 (four) times daily.    Yes Historical Provider, MD  polyethylene glycol (MIRALAX / GLYCOLAX) packet Take 17 g by mouth 2 (two) times daily.    Yes Historical Provider, MD  RESTASIS 0.05 % ophthalmic emulsion USE 1 DROP INTO BOTH EYES TWICE DAILY 10/19/14  Yes Kirt Boys, DO  sennosides-docusate sodium (SENOKOT-S) 8.6-50 MG tablet Take 1 tablet by mouth 2 (two) times daily.   Yes Historical Provider, MD  sertraline (ZOLOFT) 100 MG tablet Take one tablet by mouth once daily for depression 09/03/15  Yes Kirt Boys, DO  sodium chloride 1 g tablet TAKE 1 TABLET BY MOUTH THREE TIMES  DAILY 05/19/15  Yes Kirt Boys, DO  traMADol (ULTRAM) 50 MG tablet Take 50 mg by mouth 2 (two) times daily as needed for moderate pain.    Yes Historical Provider, MD    Physical Exam: Filed Vitals:   09/12/15 0025 09/12/15 0050 09/12/15 0126 09/12/15 0241  BP:   172/82   Pulse:   80 100  Temp: 100.2 F (37.9 C) 100.2 F (37.9 C)  99.1 F (37.3 C)  TempSrc: Rectal Rectal  Oral  Resp:   20 20  Height:      Weight:      SpO2:   95% 99%      Constitutional: NAD, calm, comfortable Eyes: PERTLA, lids and conjunctivae normal ENMT: Mucous membranes are dry. Posterior pharynx clear of any exudate or lesions.   Neck: normal, supple, no masses, no thyromegaly Respiratory: Rhonchi at left mid-lung field and base. Normal respiratory effort. No accessory muscle use.  Cardiovascular: Rate ~110 and irregular. 1+ pretibial edema b/l. No significant JVD. Abdomen: Distended, mildly tender throughout without rebound pain or guarding. Bowel sounds normal.  Musculoskeletal: Right 3rd toe is black and atrophied. No joint deformity upper and lower extremities. Normal muscle tone.  Skin: no significant rashes, lesions, ulcers aside from foot findings above. Warm, dry, well-perfused. Neurologic: CN 2-12 grossly intact. Sensation intact, DTR normal. Strength 5/5 in all 4 limbs.  Psychiatric: Normal judgment and insight. Alert and oriented x 3. Normal mood and affect.     Labs on Admission: I have personally reviewed following labs and imaging studies  CBC:  Recent Labs Lab 09/05/15 0429 09/10/15 1213 09/11/15 2351 09/12/15 0040  WBC 20.7* 19.1* 18.7*  --   NEUTROABS  --  7.7* 8.7*  --   HGB 9.9*  --  10.8* 11.9*  HCT 29.7* 32.5* 31.1* 35.0*  MCV 93.4 99* 96.6  --   PLT 285 337 335  --    Basic Metabolic Panel:  Recent Labs Lab 09/05/15 0429 09/10/15 1213 09/11/15 2351 09/12/15 0040  NA 135 145* 139 142  K 4.0 4.0 5.3* 5.0  CL 102 102 105 104  CO2 26 22 25   --   GLUCOSE 95  111* 115* 108*  BUN 10 17 19  25*  CREATININE 1.46* 1.64* 1.52* 1.50*  CALCIUM 9.1 9.8 9.7  --    GFR: Estimated Creatinine Clearance: 23.3 mL/min (by C-G formula based on Cr of 1.5). Liver Function Tests:  Recent Labs Lab 09/05/15 0429 09/11/15 2351  AST 20 43*  ALT 14 24  ALKPHOS 52 59  BILITOT 0.5 1.2  PROT 5.7* 7.6  ALBUMIN 2.3* 3.7    Recent Labs Lab 09/11/15 2351  LIPASE 81*   No results for input(s): AMMONIA in the last 168 hours. Coagulation Profile: No results for input(s): INR, PROTIME in the last 168 hours. Cardiac Enzymes: No results for input(s): CKTOTAL, CKMB, CKMBINDEX, TROPONINI in the last 168 hours. BNP (last 3 results) No results for input(s): PROBNP in the last 8760 hours. HbA1C: No results for input(s): HGBA1C in the last 72 hours. CBG:  Recent Labs Lab 09/05/15 0742 09/05/15 1204  GLUCAP 109* 130*   Lipid Profile: No results for input(s): CHOL, HDL, LDLCALC, TRIG, CHOLHDL, LDLDIRECT in the last 72 hours. Thyroid Function Tests:  Recent Labs  09/10/15 1213  TSH 58.260*  FREET4 0.72*   Anemia Panel: No results for input(s): VITAMINB12, FOLATE, FERRITIN, TIBC, IRON, RETICCTPCT in the last 72 hours. Urine analysis:    Component Value Date/Time   COLORURINE YELLOW 09/12/2015 0036   APPEARANCEUR CLEAR 09/12/2015 0036   LABSPEC 1.025 09/12/2015 0036   PHURINE 6.5 09/12/2015 0036   GLUCOSEU NEGATIVE 09/12/2015 0036   HGBUR NEGATIVE 09/12/2015 0036   BILIRUBINUR NEGATIVE 09/12/2015 0036   KETONESUR NEGATIVE 09/12/2015 0036   PROTEINUR 100* 09/12/2015 0036   UROBILINOGEN 0.2 01/27/2015 2308   NITRITE NEGATIVE 09/12/2015 0036   LEUKOCYTESUR NEGATIVE 09/12/2015 0036   Sepsis Labs: @LABRCNTIP (procalcitonin:4,lacticidven:4) )No results found  for this or any previous visit (from the past 240 hour(s)).   Radiological Exams on Admission: Dg Chest 2 View  09/11/2015  CLINICAL DATA:  10-year-old female with shortness of breath and  tachypnea EXAM: CHEST  2 VIEW COMPARISON:  Chest radiograph dated 08/26/2015 FINDINGS: Two views of the chest demonstrate a small left pleural effusion, new from prior study. There is associated partial compressive atelectasis of the left lung base. Pneumonia is not excluded. The right lung is clear. There is no pneumothorax. Top-normal cardiac silhouette. There is degenerative changes of the shoulders. No acute osseous pathology. IMPRESSION: Small left pleural effusion with left lung base atelectasis versus infiltrate. Electronically Signed   By: Elgie Collard M.D.   On: 09/11/2015 23:56   Ct Abdomen Pelvis W Contrast  09/12/2015  CLINICAL DATA:  82 year old female with abdominal pain. History of small-bowel obstruction. EXAM: CT ABDOMEN AND PELVIS WITH CONTRAST TECHNIQUE: Multidetector CT imaging of the abdomen and pelvis was performed using the standard protocol following bolus administration of intravenous contrast. CONTRAST:  80mL ISOVUE-300 IOPAMIDOL (ISOVUE-300) INJECTION 61% COMPARISON:  CT dated 08/26/2015 is FINDINGS: There is a partially visualized small left pleural effusion. Patchy areas of airspace density at the lung bases as well as multiple ground-glass and nodular density at the left lung base are concerning for pneumonia. Clinical correlation is recommended. There is coronary vascular calcification. No intra-abdominal free air.  No free fluid. Cholecystectomy. Multiple small scattered hepatic hypodense lesions of measuring up to 9 mm are not well characterized but likely represent cysts or hemangioma. Cholecystectomy. The pancreas, and the adrenal glands appear unremarkable. Subcentimeter splenic hypodense lesion is not characterized but likely represents a cyst or hemangioma. There is no hydronephrosis on either side. The visualized ureters appear grossly unremarkable. The urinary bladder is partially distended. There is mild haziness of the bladder wall. Correlation with urinalysis  recommended to exclude cystitis. The uterus is grossly unremarkable. There is mild thickened appearance of the distal esophagus which may represent esophagitis or esophageal irritation related to reflux. The stomach is distended with gastric contents, likely related to the reported chronically impaired gastric emptying. There is thickened appearance of the gastric antrum. There is mild haziness of the proximal duodenum concerning for duodenitis. There is postsurgical changes of bowel with duodenojejunal anastomosis. There is no evidence of small-bowel obstruction. There are scattered sigmoid and colonic diverticula without active inflammatory changes. There is postsurgical changes of partial sigmoid resection with anastomotic suture. There is advanced aortoiliac atherosclerotic disease. The origins of the celiac axis, SMA, and IMA appear patent. No portal venous gas identified. There is no adenopathy. There is midline vertical anterior pelvic wall incisional scar. There is a 5 cm defect in the anterior peritoneal wall along the surgical incision with protrusion of a short segment of the colon. A small fat containing left para incisional hernia noted. There is diffuse stranding of the subcutaneous fat. No fluid collection or hematoma. There is osteopenia with multilevel degenerative changes of the spine. T11 compression deformity similar to prior study. No acute fracture. IMPRESSION: Small left pleural effusion with findings concerning for bibasilar pneumonia. Clinical correlation and follow-up recommended. Severe gastric distention likely related to chronic gastric dysmotility or partial gastric outlet obstruction. Clinical correlation is recommended. There has been interval increase in the gastric distension compared the prior CT. Mild haziness of the proximal duodenum concerning for duodenitis. No evidence of small-bowel obstruction. Esophagitis. Colonic diverticulosis without active inflammatory changes.  Electronically Signed   By: Elgie Collard  M.D.   On: 09/12/2015 00:33    EKG: Ordered and pending.   Assessment/Plan  1. HCAP  - PNA apparent on CXR and CT  - At-risk for MDR organisms given recent hospitalization and historically positive MRSA screens - Blood and urine cultures obtained, incubating  - Lactate reassuring at 1.52  - Empiric abx initiated in ED with vancomycin and Zosyn, will continue vanco for now and replace Zosyn with cefepime  - Trend PCT, WBC, fever, curve, clinical course  - Check urine for strep pneumo antigens; check sputum GS and culture if pt can produce sample   2. Abd pain, N/V - CT abd/pelvis with severe gastric distension, likely d/t chronic dysmotility or partial GOO; no SBO  - Pain and nausea subsided with Zofran and analgesics in ED, will hold-off on NGT  - Continue BID Protonix, Reglan, prn Zofran  - Liquid diet, to be advanced as she tolerates    3. Osteomyelitis of right 3rd toe  - Amputation was advised during the recent admission, but the pt declines  - Started on 30-day course Augmentin on 09/05/15; currently on broad-spectrums for HCAP as above   4. AKI superimposed on CKD III  - SCr 1.52 on admission, up from 0.98 in April 2017  - Likely a prerenal azotemia in setting of poor PO intake and vomiting  - Check urine studies  - Providing IV hydration  - Avoid nephrotoxins  - Repeat chem panel tomorrow    5. Hypothyroidism - Appears to be stable - Continue current-dose Synthroid   6. Hypertension - Elevated to 180/85 range on presentation, with abd pain and vomiting likely contributing  - Managed with Coreg and hydralazine at home, will continue   7. Depression - Appears to be stable  - Continue current-dose Zoloft    8. Normocytic anemia  - Hgb is 10.8 on admission, stable relative to priors  - Likely secondary to chronic disease; no sign of active blood-loss   9. Atrial fibrillation, paroxysmal  - CHADS-VASc at least 5 (age x2,  gender, HTN, PAD) - Anticoagulated with Eliquis, will continue  - Continue rate-control with amiodarone    DVT prophylaxis: On Eliquis  Code Status: Partial, no CPR, no intubation   Family Communication: Discussed with patient  Disposition Plan: Admit to telemetry   Consults called: None  Admission status: Inpatient    Briscoe Deutscher, MD [facility] Pager [phone]  If 7PM-7AM, please contact night-coverage [email or web address] Password [email or web address]  09/12/2015, 4:11 AM

## 2015-09-12 NOTE — Progress Notes (Signed)
PROGRESS NOTE                                                                                                                                                                                                             Patient Demographics:    Shannon [name], is a 82 y.o. female, DOB - 03/17/1924, ZOX:[id number]  Admit date - 09/11/2015   Admitting Physician Briscoe Deutscher, MD  Outpatient Primary MD for the patient is Kirt Boys, DO  LOS - 0  Chief Complaint  Patient presents with  . Abdominal Pain       Brief Narrative    Shannon [name] is a medically complex 82 y.o. female with medical history significant for hypertension, hyperlipidemia, hypothyroidism, peripheral arterial disease with ischemic toe complicated by osteomyelitis, and chronic gastric distention with recurrent nausea and vomiting who presents to the ED for evaluation of abdominal pain with nausea and vomiting of 2 days' duration. Patient had a recent admission, just discharged on 09/05/2015 following management of abdominal pain with nausea and vomiting. She had an NG tube placed at that time and was started on liquid diet, essentially improving spontaneously and tolerating full diet. She was also noted to have an ischemic third toe on the right foot secondary to PAD and workup revealed underlying osteomyelitis. Surgery was advised for management of this, however patient declined. She was discharged with a one-month course of Augmentin. She had initially done well back at home before the insidious development of recurrent abdominal pain with nausea and vomiting. She reports the vomitus as nonbloody and nonbilious. There's been no diarrhea; in fact she has not had a bowel movement in 2-3 days. She denies chest pain or palpitations, but does endorse dyspnea with minimal exertion. She denies any significant cough.  ED Course: Upon arrival to the ED, patient is found to  have temperature elevated to 37.9 C, saturating low 90s on room air at rest, hypertensive to 180/85, and with normal heart rate. Chest x-ray each interspace small left pleural effusion and a infiltrate in the left base. CT of the abdomen and pelvis was obtained and notable for severe gastric distention, likely due to chronic dysmotility or partial gastric outlet obstruction. Also noted on CT is mild haziness around the duodenum consistent with duodenitis. The lung bases are visualized on the CT and findings consistent  with bibasilar pneumonia. Chemistry panels notable for a potassium of 5.3 and creatinine 1.52, up from 0.982 months prior. CBC features a leukocytosis to 18,700 and hemoglobin of 10.8 with normal MCV. Lactic acid is reassuring at 1.52 and urinalysis is unremarkable. A 1 L bolus of normal saline was administered and blood and urine cultures were obtained. Patient was started on empiric vancomycin and Zosyn for suspected healthcare associated pneumonia. Symptomatic care was provided with Tylenol, fentanyl, and Zofran. Patient remained hemodynamically stable in the emergency department and will be admitted to telemetry for ongoing evaluation and management of HCAP, abdominal pain, nausea, and vomiting, with acute kidney injury and mild hyperkalemia    Subjective:    [name] today has, No headache, No chest pain, No abdominal pain - No Nausea, No new weakness tingling or numbness, No Cough - SOB.     Assessment  & Plan :     1. HCAP - likely aspiration, from gastric outlet obstruction, appears nontoxic currently afebrile, for 24 hours continue vancomycin and cefepime, monitor cultures. If stable will taper down to Unasyn.  2. Gastric outlet obstruction due to pyloric stenosis. GI consulted, question repeat EGD with dilation, currently nothing by mouth except medications, she follows with Dr. Marina Goodell.  3. Right third toe gangrene. On antibiotics, refused amputation last admission, again  refuses, confirmed with her daughter. Suppressive antibiotic course ongoing for 30 days. After antibiotic course she wants to continue with wound care and supportive care. If worst daughter requested to consider comfort care.  4. Dyslipidemia. On statin.  5. Depression. On Zoloft.  6. Hypothyroidism recently Synthroid increased to 88 g few days ago, repeat TSH in 4 weeks from now.  7. GERD. On PPI  8. Paroxysmal atrial fibrillation with RVR [place] vasc 2 score of 5. Continue amiodarone, Coreg and Eliquis.   9. History of asthma COPD. Stable.    Code Status :  Partial with no CPR or intubation  Family Communication  :  None present  Disposition Plan  :  Stay inpatient for now  Consults  :  GI  Procedures  :    CT chest. Possible pneumonia, gastric distention  DVT Prophylaxis  :  Eliquis  Lab Results  Component Value Date   PLT 335 09/11/2015    Inpatient Medications  Scheduled Meds: . amiodarone  100 mg Oral Daily  . amLODipine  10 mg Oral Daily  . apixaban  2.5 mg Oral BID  . atorvastatin  10 mg Oral q1800  . calcium-vitamin D  1 tablet Oral Q breakfast  . carvedilol  3.125 mg Oral BID WC  . ceFEPime (MAXIPIME) IV  1 g Intravenous QHS  . chlorhexidine  15 mL Mouth/Throat BID  . cycloSPORINE  1 drop Both Eyes BID  . feeding supplement  1 Container Oral TID BM  . fluticasone  2 spray Each Nare Daily  . gabapentin  300 mg Oral Daily  . gabapentin  600 mg Oral 2 times per day  . hydrALAZINE  50 mg Oral QID  . isosorbide mononitrate  30 mg Oral Daily  . latanoprost  1 drop Both Eyes QHS  . levothyroxine  88 mcg Oral QAC breakfast  . metoCLOPramide  5 mg Oral TID AC & HS  . mometasone-formoterol  2 puff Inhalation BID  . pantoprazole  40 mg Oral BID  . polyethylene glycol  17 g Oral BID  . polyvinyl alcohol  1 drop Both Eyes QID  . senna-docusate  1 tablet  Oral BID  . sertraline  100 mg Oral Daily  . [START ON 09/13/2015] vancomycin  750 mg Intravenous Q24H    Continuous Infusions: . sodium chloride 75 mL/hr at 09/12/15 0900   PRN Meds:.acetaminophen, albuterol, diclofenac sodium, lactulose, ondansetron, traMADol  Antibiotics  :    Anti-infectives    Start     Dose/Rate Route Frequency Ordered Stop   09/13/15 0600  vancomycin (VANCOCIN) IVPB 750 mg/150 ml premix     750 mg 150 mL/hr over 60 Minutes Intravenous Every 24 hours 09/12/15 0546     09/12/15 2200  ceFEPIme (MAXIPIME) 1 g in dextrose 5 % 50 mL IVPB     1 g 100 mL/hr over 30 Minutes Intravenous Daily at bedtime 09/12/15 0410 09/20/15 2159   09/12/15 0430  ceFEPIme (MAXIPIME) 1 g in dextrose 5 % 50 mL IVPB     1 g 100 mL/hr over 30 Minutes Intravenous  Once 09/12/15 0427 09/12/15 0540   09/12/15 0100  vancomycin (VANCOCIN) IVPB 1000 mg/200 mL premix     1,000 mg 200 mL/hr over 60 Minutes Intravenous  Once 09/12/15 0046 09/12/15 0400   09/12/15 0100  piperacillin-tazobactam (ZOSYN) IVPB 3.375 g     3.375 g 100 mL/hr over 30 Minutes Intravenous  Once 09/12/15 0046 09/12/15 0222         Objective:   Filed Vitals:   09/12/15 0126 09/12/15 0241 09/12/15 0656 09/12/15 0954  BP: 172/82  175/75 130/77  Pulse: 80 100 95 83  Temp:  99.1 F (37.3 C) 98.2 F (36.8 C)   TempSrc:  Oral Oral   Resp: 20 20 20    Height:  5\' 6"  (1.676 m)    Weight:  66.724 kg (147 lb 1.6 oz)    SpO2: 95% 99% 100%     Wt Readings from Last 3 Encounters:  09/12/15 66.724 kg (147 lb 1.6 oz)  09/07/15 68.493 kg (151 lb)  08/26/15 68.357 kg (150 lb 11.2 oz)     Intake/Output Summary (Last 24 hours) at 09/12/15 1036 Last data filed at 09/12/15 0510  Gross per 24 hour  Intake 923.33 ml  Output    400 ml  Net 523.33 ml     Physical Exam  Awake Alert, Oriented X 3, No new F.N deficits, Normal affect [place].AT,PERRAL Supple Neck,No JVD, No cervical lymphadenopathy appriciated.  Symmetrical Chest wall movement, Good air movement bilaterally, CTAB RRR,No Gallops,Rubs or new Murmurs, No  Parasternal Heave +ve B.Sounds, Abd distended, Mild epigastric tenderness, No organomegaly appriciated, No rebound - guarding or rigidity. No Cyanosis, Clubbing or edema, No new Rash or bruise, Right 3rd toe is black and atrophied    Data Review:    CBC  Recent Labs Lab 09/10/15 1213 09/11/15 2351 09/12/15 0040  WBC 19.1* 18.7*  --   HGB  --  10.8* 11.9*  HCT 32.5* 31.1* 35.0*  PLT 337 335  --   MCV 99* 96.6  --   MCH 32.4 33.5  --   MCHC 32.6 34.7  --   RDW 18.1* 18.2*  --   LYMPHSABS 1.3 1.1  --   MONOABS  --  0.7  --   EOSABS 9.3* 8.2*  --   BASOSABS 0.1 0.0  --     Chemistries   Recent Labs Lab 09/10/15 1213 09/11/15 2351 09/12/15 0040  NA 145* 139 142  K 4.0 5.3* 5.0  CL 102 105 104  CO2 22 25  --   GLUCOSE 111* 115* 108*  BUN 17 19 25*  CREATININE 1.64* 1.52* 1.50*  CALCIUM 9.8 9.7  --   AST  --  43*  --   ALT  --  24  --   ALKPHOS  --  59  --   BILITOT  --  1.2  --    ------------------------------------------------------------------------------------------------------------------ No results for input(s): CHOL, HDL, LDLCALC, TRIG, CHOLHDL, LDLDIRECT in the last 72 hours.  Lab Results  Component Value Date   HGBA1C 6.5* 08/28/2015   ------------------------------------------------------------------------------------------------------------------  Recent Labs  09/10/15 1213  TSH 58.260*   ------------------------------------------------------------------------------------------------------------------ No results for input(s): VITAMINB12, FOLATE, FERRITIN, TIBC, IRON, RETICCTPCT in the last 72 hours.  Coagulation profile No results for input(s): INR, PROTIME in the last 168 hours.  No results for input(s): DDIMER in the last 72 hours.  Cardiac Enzymes No results for input(s): CKMB, TROPONINI, MYOGLOBIN in the last 168 hours.  Invalid input(s):  CK ------------------------------------------------------------------------------------------------------------------    Component Value Date/Time   BNP 39.0 08/26/2015 0630    Micro Results No results found for this or any previous visit (from the past 240 hour(s)).  Radiology Reports   Ct Abdomen Pelvis W Contrast  09/12/2015  CLINICAL DATA:  82 year old female with abdominal pain. History of small-bowel obstruction. EXAM: CT ABDOMEN AND PELVIS WITH CONTRAST TECHNIQUE: Multidetector CT imaging of the abdomen and pelvis was performed using the standard protocol following bolus administration of intravenous contrast. CONTRAST:  80mL ISOVUE-300 IOPAMIDOL (ISOVUE-300) INJECTION 61% COMPARISON:  CT dated 08/26/2015 is FINDINGS: There is a partially visualized small left pleural effusion. Patchy areas of airspace density at the lung bases as well as multiple ground-glass and nodular density at the left lung base are concerning for pneumonia. Clinical correlation is recommended. There is coronary vascular calcification. No intra-abdominal free air.  No free fluid. Cholecystectomy. Multiple small scattered hepatic hypodense lesions of measuring up to 9 mm are not well characterized but likely represent cysts or hemangioma. Cholecystectomy. The pancreas, and the adrenal glands appear unremarkable. Subcentimeter splenic hypodense lesion is not characterized but likely represents a cyst or hemangioma. There is no hydronephrosis on either side. The visualized ureters appear grossly unremarkable. The urinary bladder is partially distended. There is mild haziness of the bladder wall. Correlation with urinalysis recommended to exclude cystitis. The uterus is grossly unremarkable. There is mild thickened appearance of the distal esophagus which may represent esophagitis or esophageal irritation related to reflux. The stomach is distended with gastric contents, likely related to the reported chronically impaired  gastric emptying. There is thickened appearance of the gastric antrum. There is mild haziness of the proximal duodenum concerning for duodenitis. There is postsurgical changes of bowel with duodenojejunal anastomosis. There is no evidence of small-bowel obstruction. There are scattered sigmoid and colonic diverticula without active inflammatory changes. There is postsurgical changes of partial sigmoid resection with anastomotic suture. There is advanced aortoiliac atherosclerotic disease. The origins of the celiac axis, SMA, and IMA appear patent. No portal venous gas identified. There is no adenopathy. There is midline vertical anterior pelvic wall incisional scar. There is a 5 cm defect in the anterior peritoneal wall along the surgical incision with protrusion of a short segment of the colon. A small fat containing left para incisional hernia noted. There is diffuse stranding of the subcutaneous fat. No fluid collection or hematoma. There is osteopenia with multilevel degenerative changes of the spine. T11 compression deformity similar to prior study. No acute fracture. IMPRESSION: Small left pleural effusion with findings concerning for bibasilar pneumonia. Clinical  correlation and follow-up recommended. Severe gastric distention likely related to chronic gastric dysmotility or partial gastric outlet obstruction. Clinical correlation is recommended. There has been interval increase in the gastric distension compared the prior CT. Mild haziness of the proximal duodenum concerning for duodenitis. No evidence of small-bowel obstruction. Esophagitis. Colonic diverticulosis without active inflammatory changes. Electronically Signed   By: Elgie Collard M.D.   On: 09/12/2015 00:33     Dg Abd Portable 1v  09/12/2015  CLINICAL DATA:  Nausea. EXAM: PORTABLE ABDOMEN - 1 VIEW COMPARISON:  CT scan from yesterday FINDINGS: There is a paucity of bowel gas which limits evaluation. No evidence of small bowel obstruction  on this study. The stomach may still be distended with debris. Contrast is seen in the left renal collecting system from yesterday's CT scan. No other acute abnormalities. IMPRESSION: No small bowel obstruction identified. The stomach is not as well evaluated on this study but may still be distended with debris. Electronically Signed   By: Gerome Sam III M.D   On: 09/12/2015 07:07      Time Spent in minutes  30   Leroy Sea M.D on 09/12/2015 at 10:36 AM  Between 7am to 7pm - Pager - [phone]  After 7pm go to [email or web address] - password [facility]  [facility] -  Office  [phone]

## 2015-09-12 NOTE — ED Notes (Signed)
Pt O2 sat dropped to 88%.  Pt placed on 2L [place] and O2 improved to 98%

## 2015-09-12 NOTE — Progress Notes (Signed)
Pharmacy Antibiotic Note  Shannon [name] is a 82 y.o. female admitted on 09/11/2015 with pneumonia, osteomyelitis of right 3rd toe.  Pharmacy has been consulted for Vancomycin, cefepime dosing.  First dose of antibiotics already given.  Plan: Vancomycin 750mg  IV every 24 hours.  Goal trough 15-20 mcg/mL.  Cefepime 1gm iv q24hr  Height: 5\' 6"  (167.6 cm) Weight: 150 lb (68.04 kg) IBW/kg (Calculated) : 59.3  Temp (24hrs), Avg:99.9 F (37.7 C), Min:99.1 F (37.3 C), Max:100.2 F (37.9 C)   Recent Labs Lab 09/10/15 1213 09/11/15 2351 09/12/15 0039 09/12/15 0040  WBC 19.1* 18.7*  --   --   CREATININE 1.64* 1.52*  --  1.50*  LATICACIDVEN  --   --  1.52  --     Estimated Creatinine Clearance: 23.3 mL/min (by C-G formula based on Cr of 1.5).    No Known Allergies  Antimicrobials this admission: Vancomycin 09/12/2015 >> Cefepime 09/12/2015 >>   Dose adjustments this admission: -   Microbiology results: pending  Thank you for allowing pharmacy to be a part of this patient's care.  [name] 09/12/2015 5:49 AM

## 2015-09-13 DIAGNOSIS — K228 Other specified diseases of esophagus: Secondary | ICD-10-CM

## 2015-09-13 DIAGNOSIS — K9189 Other postprocedural complications and disorders of digestive system: Secondary | ICD-10-CM

## 2015-09-13 LAB — PROTIME-INR
INR: 1.37 (ref 0.00–1.49)
Prothrombin Time: 17 seconds — ABNORMAL HIGH (ref 11.6–15.2)

## 2015-09-13 LAB — APTT
aPTT: 33 seconds (ref 24–37)
aPTT: 98 seconds — ABNORMAL HIGH (ref 24–37)

## 2015-09-13 LAB — CBC
HCT: 25.4 % — ABNORMAL LOW (ref 36.0–46.0)
Hemoglobin: 8.7 g/dL — ABNORMAL LOW (ref 12.0–15.0)
MCH: 32.5 pg (ref 26.0–34.0)
MCHC: 34.3 g/dL (ref 30.0–36.0)
MCV: 94.8 fL (ref 78.0–100.0)
Platelets: 273 10*3/uL (ref 150–400)
RBC: 2.68 MIL/uL — ABNORMAL LOW (ref 3.87–5.11)
RDW: 18.1 % — ABNORMAL HIGH (ref 11.5–15.5)
WBC: 16.5 10*3/uL — ABNORMAL HIGH (ref 4.0–10.5)

## 2015-09-13 LAB — PATHOLOGIST SMEAR REVIEW

## 2015-09-13 LAB — BASIC METABOLIC PANEL
Anion gap: 7 (ref 5–15)
BUN: 16 mg/dL (ref 6–20)
CO2: 25 mmol/L (ref 22–32)
Calcium: 8.4 mg/dL — ABNORMAL LOW (ref 8.9–10.3)
Chloride: 110 mmol/L (ref 101–111)
Creatinine, Ser: 1.38 mg/dL — ABNORMAL HIGH (ref 0.44–1.00)
GFR calc Af Amer: 38 mL/min — ABNORMAL LOW (ref >60)
GFR calc non Af Amer: 33 mL/min — ABNORMAL LOW (ref >60)
Glucose, Bld: 82 mg/dL (ref 65–99)
Potassium: 3.1 mmol/L — ABNORMAL LOW (ref 3.5–5.1)
Sodium: 142 mmol/L (ref 135–145)

## 2015-09-13 LAB — HEPARIN LEVEL (UNFRACTIONATED)
Heparin Unfractionated: 1.12 IU/mL — ABNORMAL HIGH (ref 0.30–0.70)
Heparin Unfractionated: 1.1 IU/mL — ABNORMAL HIGH (ref 0.30–0.70)

## 2015-09-13 LAB — URINE CULTURE: Culture: 8000 — AB

## 2015-09-13 MED ORDER — METOPROLOL TARTRATE 5 MG/5ML IV SOLN
5.0000 mg | INTRAVENOUS | Status: DC | PRN
  Administered 2015-09-27 – 2015-09-29 (×3): 5 mg via INTRAVENOUS
  Filled 2015-09-13 (×3): qty 5

## 2015-09-13 MED ORDER — BISACODYL 10 MG RE SUPP: 10.0000 mg | Freq: Every day | RECTAL | Status: DC | PRN

## 2015-09-13 MED ORDER — POTASSIUM CHLORIDE 10 MEQ/100ML IV SOLN
10.0000 meq | INTRAVENOUS | Status: AC
  Administered 2015-09-13 (×4): 10 meq via INTRAVENOUS
  Filled 2015-09-13 (×3): qty 100

## 2015-09-13 MED ORDER — NITROGLYCERIN 2 % TD OINT
0.5000 [in_us] | TOPICAL_OINTMENT | Freq: Four times a day (QID) | TRANSDERMAL | Status: DC
  Administered 2015-09-13 – 2015-09-21 (×31): 0.5 [in_us] via TOPICAL
  Filled 2015-09-13: qty 30

## 2015-09-13 MED ORDER — SODIUM CHLORIDE 0.9 % IV SOLN
INTRAVENOUS | Status: DC
  Administered 2015-09-13 – 2015-09-14 (×2): via INTRAVENOUS

## 2015-09-13 MED ORDER — ONDANSETRON HCL 4 MG/2ML IJ SOLN
4.0000 mg | Freq: Four times a day (QID) | INTRAMUSCULAR | Status: DC | PRN
  Administered 2015-09-17 – 2015-09-23 (×2): 4 mg via INTRAVENOUS
  Filled 2015-09-13: qty 2

## 2015-09-13 MED ORDER — HYDRALAZINE HCL 20 MG/ML IJ SOLN
10.0000 mg | Freq: Four times a day (QID) | INTRAMUSCULAR | Status: DC | PRN
  Administered 2015-09-20 – 2015-09-23 (×6): 10 mg via INTRAVENOUS
  Filled 2015-09-13 (×6): qty 1

## 2015-09-13 MED ORDER — LEVOTHYROXINE SODIUM 100 MCG IV SOLR
50.0000 ug | Freq: Every day | INTRAVENOUS | Status: DC
  Administered 2015-09-14 – 2015-09-16 (×3): 50 ug via INTRAVENOUS
  Filled 2015-09-13 (×6): qty 5

## 2015-09-13 MED ORDER — POTASSIUM CHLORIDE 10 MEQ/100ML IV SOLN
INTRAVENOUS | Status: AC
  Filled 2015-09-13: qty 100

## 2015-09-13 MED ORDER — HEPARIN (PORCINE) IN NACL 100-0.45 UNIT/ML-% IJ SOLN
650.0000 [IU]/h | INTRAMUSCULAR | Status: DC
  Administered 2015-09-14: 650 [IU]/h via INTRAVENOUS
  Filled 2015-09-13: qty 250

## 2015-09-13 MED ORDER — HEPARIN (PORCINE) IN NACL 100-0.45 UNIT/ML-% IJ SOLN
900.0000 [IU]/h | INTRAMUSCULAR | Status: DC
  Administered 2015-09-13: 900 [IU]/h via INTRAVENOUS
  Filled 2015-09-13: qty 250

## 2015-09-13 MED ORDER — FAMOTIDINE IN NACL 20-0.9 MG/50ML-% IV SOLN
20.0000 mg | Freq: Two times a day (BID) | INTRAVENOUS | Status: DC
  Administered 2015-09-13 – 2015-09-16 (×7): 20 mg via INTRAVENOUS
  Filled 2015-09-13 (×10): qty 50

## 2015-09-13 MED ORDER — ACETAMINOPHEN 650 MG RE SUPP: 325.0000 mg | Freq: Four times a day (QID) | RECTAL | Status: DC | PRN

## 2015-09-13 NOTE — Consult Note (Signed)
Consultation  Referring Provider:   Dr. Candiss Norse   Primary Care Physician:  Gildardo Cranker, DO Primary Gastroenterologist: Dr. Henrene Pastor        Reason for Consultation: Abdominal distension and vomiting         HPI:   Shannon [name] is a 82 y.o. African American female with a history of type 2 diabetes, chronic normocytic anemia, "light stroke", not on anticoagulation, hypothyroidism, CK D stage III and SIADH who was admitted to the hospital on 09/11/15 for repeat symptoms of abdominal distention, nausea and vomiting. Please recall patient had recently been discharged after admission from 08/27/15 through 09/07/15 for these same symptoms. Patient had improved after NG tube and was discharged on a liquid diet. (see below for extensive Gi hx)  Today, the patient is accompanied by her daughter, and explains that shortly after returning home within a couple of days, she began to feel that her abdomen was "tightening again", and for 2 days prior to admission she could not eat anything without vomiting. The patient tells me this is "exactly why she was in the hospital last". Patient tells me that she "never truly improved". Patient explains that she has had an improvement in her stools in the morning regular bowel movement yesterday morning. She describes last time she tried to eat anything was applesauce in the ER on 09/11/15, since then she has been nothing by mouth.  Recent Hx per last admission: S/p remote gastrojejunostomy likely for ulcer. 09/2013 surgery for ulcer associated perforation at [place] anastomosis involving resection and revision of GJ anastomosis along with incisional herniorrhaphy. 01/2014 admission with GOO and EGDs x 2 showing gastric bezoar. Recurrent bezoar diagnosed on CT 03/2014. 03/19/2014 EGD: > 500 cc retained fluid; tortuous, narrowed distal stomach but no clear stricture or ulcers and mucosa normal at stomach and duodenum; able to pass scope beyond stomach with minimal pressure. It  was felt her distorted distal gastric anatomy was causing at least partial gastric outlet obstruction. 03/2016 UGI/SBFT: normal. Recurrent n/v, functional GOO 05/2014 , CT did not show bezoar. 05/22/14 esophagram: small paraesophageal hernia, barium trickled into duodenum. No contrast extravasation.  Reglan was added, NGT placed; Dr Carlean Purl felt this is/was functional issue related to prior vagotomy and was likely to recurr.  At GI ROV 06/10/14 pt doing well on purrees, Boost, careful eating rituals, Protonix 40 BID. Reglan was discontinued due to potential harmful s/e.  01/2015 had 17 day admission with SBO. S/p 10/19/16ex lap with LOA.  4/3 - 4/9 admission for chest pain, odyno/dysphagia, Afib/RVR, non STEMI (demand ischemia), iatrogenic hyperthyroidism (TSH 0.11), hypoxic resp failure.  07/08/2015 inpt EGD, Dr Loletha Carrow: candida esophagitis, pyloric stenosos, scope passed with moderate resistance, normal duodenum. Rx: Diflucan x 21 days.  At 4/20 OV with Dr Scarlette Shorts, dysphagia improved. Was to complete 21 days Diflucan and maintain BID Protonix, puree diet.   5/1 - 5/4 admission for bradycardia, diastolic CHF, AKI, hyponatremia, hypothyroidism (TSH 83).  08/17/15 her podiatrist RXd Doxycycline for cellulitis and diabetic foot ulcer. This was debrided on 5/16 and again on 5/23 at [facility]   5/26-09/07/15 readmitted to [facility] for nausea, vomiting and diarrhea accompanied by lower abdominal pain. Non-contrast CT abd/pelvis 08/26/15: gastric distention, gastritis, esophagitis, colitis (prox colon: edema/thickening most prominent at prox transverse), cystitis    Past Medical History  Diagnosis Date  . Pneumonia   . Hypertension   . Small bowel obstruction ([place])     "I don't know how many" (01/11/2015)  .  Ventral hernia with bowel obstruction   . Asthma   . Hyperlipidemia   . Stroke ([place])     "light one"  . COPD (chronic obstructive pulmonary disease) ([place])   . Hypothyroidism     . Type II diabetes mellitus ([place])   . Anemia     previous blood transfusions  . GERD (gastroesophageal reflux disease)   . History of stomach ulcers   . Arthritis     "all over"  . Renal insufficiency   . Depression     "light case"  . SIADH (syndrome of inappropriate ADH production) ([place])     [name] 01/10/2015  . Perforated gastric ulcer ([place])   . Gastric stenosis   . Paraesophageal hernia   . Bradycardia     topped BB and amiodarone decreased- junctional rhythm     Past Surgical History  Procedure Laterality Date  . Tubal ligation    . Colectomy    . Hernia repair  2015  . Esophagogastroduodenoscopy N/A 01/19/2014    Procedure: ESOPHAGOGASTRODUODENOSCOPY (EGD);  Surgeon: Irene Shipper, MD;  Location: [facility];  Service: Endoscopy;  Laterality: N/A;  . Esophagogastroduodenoscopy N/A 01/20/2014    Procedure: ESOPHAGOGASTRODUODENOSCOPY (EGD);  Surgeon: Irene Shipper, MD;  Location: [facility];  Service: Endoscopy;  Laterality: N/A;  . Esophagogastroduodenoscopy N/A 03/19/2014    Procedure: ESOPHAGOGASTRODUODENOSCOPY (EGD);  Surgeon: Milus Banister, MD;  Location: [facility];  Service: Endoscopy;  Laterality: N/A;  . Cholecystectomy open    . Ventral hernia repair  2015    incarcerated ventral hernia ([facility] 09/2013)/notes 01/10/2015  . Cataract extraction w/ intraocular lens  implant, bilateral    . Gastrojejunostomy      hx/notes 01/10/2015  . Laparotomy N/A 01/20/2015    Procedure: EXPLORATORY LAPAROTOMY;  Surgeon: Coralie Keens, MD;  Location: [facility];  Service: General;  Laterality: N/A;  . Lysis of adhesion N/A 01/20/2015    Procedure: LYSIS OF ADHESIONS < 1 HOUR;  Surgeon: Coralie Keens, MD;  Location: [facility];  Service: General;  Laterality: N/A;  . Esophagogastroduodenoscopy N/A 07/08/2015    Procedure: ESOPHAGOGASTRODUODENOSCOPY (EGD);  Surgeon: Doran Stabler, MD;  [facility];  Service: Endoscopy;  Laterality: N/A;    Family History  Problem  Relation Age of Onset  . Stroke Mother   . Heart attack Neg Hx   . Hypertension Mother   . Diabetes Brother     Social History  Substance Use Topics  . Smoking status: Never Smoker   . Smokeless tobacco: Former Systems developer    Types: Snuff  . Alcohol Use: No    Prior to Admission medications   Medication Sig Start Date End Date Taking? Authorizing Provider  acetaminophen (TYLENOL) 325 MG tablet Take 325 mg by mouth every 6 (six) hours as needed for mild pain or fever.    Yes Historical Provider, MD  ADVAIR DISKUS 250-50 MCG/DOSE AEPB INHALE 1 PUFF INTO THE LUNGS 2 (TWO) TIMES DAILY AS NEEDED FOR SHORTNESS OF BREATH 12/01/14  Yes Historical Provider, MD  albuterol (PROVENTIL HFA;VENTOLIN HFA) 108 (90 Base) MCG/ACT inhaler Inhale 2 puffs into the lungs every 6 (six) hours as needed for wheezing or shortness of breath. 06/04/15  Yes Gildardo Cranker, DO  amiodarone (PACERONE) 100 MG tablet Take 1 tablet (100 mg total) by mouth daily. 08/05/15  Yes Alexa Angela Burke, MD  amLODipine (NORVASC) 10 MG tablet TAKE 1 TABLET BY MOUTH DAILY FOR HYPERTENSION 08/09/15  Yes Gildardo Cranker, DO  amoxicillin-clavulanate (AUGMENTIN) 500-125  MG tablet Take 1 tablet (500 mg total) by mouth 2 (two) times daily. 09/05/15  Yes Jani Gravel, MD  apixaban (ELIQUIS) 2.5 MG TABS tablet Take 1 tablet (2.5 mg total) by mouth 2 (two) times daily. 08/11/15  Yes Gildardo Cranker, DO  atorvastatin (LIPITOR) 10 MG tablet Take one tablet by mouth once daily 09/03/15  Yes Gildardo Cranker, DO  b complex vitamins tablet Take 1 tablet by mouth daily.    Yes Historical Provider, MD  calcium-vitamin D (OSCAL WITH D) 500-200 MG-UNIT per tablet Take 1 tablet by mouth daily with breakfast.    Yes Historical Provider, MD  carvedilol (COREG) 3.125 MG tablet Take 1 tablet (3.125 mg total) by mouth 2 (two) times daily with a meal. 09/05/15  Yes Jani Gravel, MD  chlorhexidine (PERIDEX) 0.12 % solution Use as directed 15 mLs in the mouth or throat 2 (two) times daily.   Yes  Historical Provider, MD  [employer] GAS RELIEF 80 MG chewable tablet CHEW 1 TABLET BY MOUTH 4 TIMES A DAY AS NEEDED FOR FLATULENCE 06/28/15  Yes Gildardo Cranker, DO  diclofenac sodium (VOLTAREN) 1 % GEL Apply 2 g topically daily as needed (for pain).    Yes Historical Provider, MD  feeding supplement (BOOST / RESOURCE BREEZE) LIQD Take 1 Container by mouth 3 (three) times daily between meals. 01/27/15  Yes Shanker Kristeen Mans, MD  fluticasone (FLONASE) 50 MCG/ACT nasal spray Place 2 sprays into both nostrils daily. 08/07/14  Yes Gildardo Cranker, DO  gabapentin (NEURONTIN) 300 MG capsule Take 2 caps po in AM and qhs, take 1 cap daily at [facility] Patient taking differently: Take 300-600 mg by mouth 3 (three) times daily. Take 2 caps po in AM and qhs, take 1 cap daily at [facility] 08/06/15  Yes Gildardo Cranker, DO  hydrALAZINE (APRESOLINE) 50 MG tablet Take 1 tablet (50 mg total) by mouth 4 (four) times daily. 09/05/15  Yes Jani Gravel, MD  isosorbide mononitrate (IMDUR) 30 MG 24 hr tablet TAKE 1 TABLET (30 MG TOTAL) BY MOUTH DAILY. 06/22/15  Yes Historical Provider, MD  lactulose (CHRONULAC) 10 GM/15ML solution Take 15 mLs (10 g total) by mouth daily as needed for mild constipation. 06/02/14  Yes Mahima Pandey, MD  latanoprost (XALATAN) 0.005 % ophthalmic solution Place 1 drop into both eyes at bedtime. 06/02/14  Yes Mahima Bubba Camp, MD  levothyroxine (SYNTHROID, LEVOTHROID) 88 MCG tablet Take 1 tablet (88 mcg total) by mouth daily before breakfast. 09/05/15  Yes Jani Gravel, MD  metoCLOPramide (REGLAN) 5 MG tablet Take 1 tablet (5 mg total) by mouth 4 (four) times daily -  before meals and at bedtime. 09/05/15  Yes Jani Gravel, MD  ondansetron (ZOFRAN) 4 MG tablet Take 1 tablet (4 mg total) by mouth every 8 (eight) hours as needed for nausea or vomiting. 09/07/15  Yes [name], NP  pantoprazole (PROTONIX) 40 MG tablet TAKE 1 TABLET BY MOUTH TWICE A DAY 08/11/15  Yes Gildardo Cranker, DO  Polyethyl Glycol-Propyl Glycol 0.4-0.3 % SOLN Place 1 drop  into both eyes 4 (four) times daily.    Yes Historical Provider, MD  polyethylene glycol (MIRALAX / GLYCOLAX) packet Take 17 g by mouth 2 (two) times daily.    Yes Historical Provider, MD  RESTASIS 0.05 % ophthalmic emulsion USE 1 DROP INTO BOTH EYES TWICE DAILY 10/19/14  Yes Gildardo Cranker, DO  sennosides-docusate sodium (SENOKOT-S) 8.6-50 MG tablet Take 1 tablet by mouth 2 (two) times daily.   Yes Historical Provider, MD  sertraline (ZOLOFT) 100 MG tablet Take one tablet by mouth once daily for depression 09/03/15  Yes Gildardo Cranker, DO  traMADol (ULTRAM) 50 MG tablet Take 50 mg by mouth 2 (two) times daily as needed for moderate pain.    Yes Historical Provider, MD  polyethylene glycol powder (GLYCOLAX/MIRALAX) powder MIX 17GRAMS IN 8 OUNCES OF LIQUID AND DRINK TWICE DAILY AS NEEDED 09/13/15   Gildardo Cranker, DO  sodium chloride 1 g tablet TAKE 1 TABLET BY MOUTH THREE TIMES DAILY 09/13/15   [name], NP    Current Facility-Administered Medications  Medication Dose Route Frequency Provider Last Rate Last Dose  . 0.9 %  sodium chloride infusion   Intravenous Continuous Thurnell Lose, MD 75 mL/hr at 09/13/15 1039    . acetaminophen (TYLENOL) tablet 325 mg  325 mg Oral Q6H PRN Vianne Bulls, MD   325 mg at 09/12/15 0837  . albuterol (PROVENTIL) (2.5 MG/3ML) 0.083% nebulizer solution 2.5 mg  2.5 mg Nebulization Q6H PRN Vianne Bulls, MD      . amiodarone (PACERONE) tablet 100 mg  100 mg Oral Daily Vianne Bulls, MD   100 mg at 09/13/15 1038  . amLODipine (NORVASC) tablet 10 mg  10 mg Oral Daily Vianne Bulls, MD   10 mg at 09/13/15 1038  . atorvastatin (LIPITOR) tablet 10 mg  10 mg Oral q1800 Vianne Bulls, MD   10 mg at 09/12/15 1821  . calcium-vitamin D (OSCAL WITH D) 500-200 MG-UNIT per tablet 1 tablet  1 tablet Oral Q breakfast Vianne Bulls, MD   1 tablet at 09/13/15 0823  . carvedilol (COREG) tablet 3.125 mg  3.125 mg Oral BID WC Vianne Bulls, MD   3.125 mg at 09/13/15 0823  .  ceFEPIme (MAXIPIME) 1 g in dextrose 5 % 50 mL IVPB  1 g Intravenous QHS Vianne Bulls, MD   1 g at 09/12/15 2308  . chlorhexidine (PERIDEX) 0.12 % solution 15 mL  15 mL Mouth/Throat BID Vianne Bulls, MD   15 mL at 09/13/15 1038  . cycloSPORINE (RESTASIS) 0.05 % ophthalmic emulsion 1 drop  1 drop Both Eyes BID Vianne Bulls, MD   1 drop at 09/13/15 1041  . diclofenac sodium (VOLTAREN) 1 % transdermal gel 2 g  2 g Topical Daily PRN Vianne Bulls, MD   2 g at 09/12/15 2350  . feeding supplement (BOOST / RESOURCE BREEZE) liquid 1 Container  1 Container Oral TID BM Vianne Bulls, MD   1 Container at 09/12/15 1400  . fluticasone (FLONASE) 50 MCG/ACT nasal spray 2 spray  2 spray Each Nare Daily Vianne Bulls, MD   2 spray at 09/13/15 1041  . gabapentin (NEURONTIN) capsule 300 mg  300 mg Oral Daily Vianne Bulls, MD   300 mg at 09/12/15 1435  . gabapentin (NEURONTIN) capsule 600 mg  600 mg Oral 2 times per day Vianne Bulls, MD   600 mg at 09/13/15 0705  . heparin ADULT infusion 100 units/mL (25000 units/265mL sodium chloride 0.45%)  900 Units/hr Intravenous Continuous Berton Mount, RPH      . hydrALAZINE (APRESOLINE) tablet 50 mg  50 mg Oral QID Vianne Bulls, MD   50 mg at 09/13/15 1038  . isosorbide mononitrate (IMDUR) 24 hr tablet 30 mg  30 mg Oral Daily Vianne Bulls, MD   30 mg at 09/13/15 1038  . lactulose (CHRONULAC) 10 GM/15ML solution 10  g  10 g Oral Daily PRN Ilene Qua Opyd, MD      . latanoprost (XALATAN) 0.005 % ophthalmic solution 1 drop  1 drop Both Eyes QHS Vianne Bulls, MD   1 drop at 09/12/15 2310  . levothyroxine (SYNTHROID, LEVOTHROID) tablet 88 mcg  88 mcg Oral QAC breakfast Vianne Bulls, MD   88 mcg at 09/13/15 0823  . mometasone-formoterol (DULERA) 200-5 MCG/ACT inhaler 2 puff  2 puff Inhalation BID Vianne Bulls, MD   2 puff at 09/13/15 0853  . ondansetron (ZOFRAN) tablet 4 mg  4 mg Oral Q8H PRN Ilene Qua Opyd, MD      . pantoprazole (PROTONIX) EC tablet 40 mg  40  mg Oral BID Vianne Bulls, MD   40 mg at 09/13/15 1038  . polyethylene glycol (MIRALAX / GLYCOLAX) packet 17 g  17 g Oral BID Vianne Bulls, MD   17 g at 09/13/15 1038  . polyvinyl alcohol (LIQUIFILM TEARS) 1.4 % ophthalmic solution 1 drop  1 drop Both Eyes QID Vianne Bulls, MD   1 drop at 09/13/15 1041  . potassium chloride 10 mEq in 100 mL IVPB  10 mEq Intravenous Q1 Hr x 4 Thurnell Lose, MD   10 mEq at 09/13/15 1038  . senna-docusate (Senokot-S) tablet 1 tablet  1 tablet Oral BID Vianne Bulls, MD   1 tablet at 09/13/15 1038  . sertraline (ZOLOFT) tablet 100 mg  100 mg Oral Daily Vianne Bulls, MD   100 mg at 09/13/15 1038  . tamsulosin (FLOMAX) capsule 0.4 mg  0.4 mg Oral Daily Thurnell Lose, MD   0.4 mg at 09/13/15 1038  . traMADol (ULTRAM) tablet 50 mg  50 mg Oral BID PRN Vianne Bulls, MD      . vancomycin (VANCOCIN) IVPB 750 mg/150 ml premix  750 mg Intravenous Q24H Vianne Bulls, MD   750 mg at 09/13/15 0705    Allergies as of 09/11/2015  . (No Known Allergies)     Review of Systems:    Constitutional: Positive for decreased appetite and weight loss No fever, chills, weakness or fatigue HEENT: Eyes: No change in vision               Ears, Nose, Throat:  No change in hearing Skin: No rash or itching Cardiovascular: No chest pain, chest pressure or chest discomfort. No palpitations or edema Respiratory: No SOB, cough or sputum Gastrointestinal: See HPI and otherwise negative Genitourinary: No dysuria or change in urinary frequency Neurological: No headache, dizziness, syncope, numbness or tingling in the extremities Musculoskeletal: No muscle, back pain, joint pain or stiffness. Hematologic: No anemia, bleeding or bruising Lymphatics: No enlarged lymph nodes or history of splenectomy Psychiatric: No history of depression or anxiety Endocrinologic: No reports of sweating, cold or heat intolerance, poyluria or polydypsia Allergies: No history of asthma, hives or  eczema    Physical Exam:  Vital signs in last 24 hours: Temp:  [98.5 F (36.9 C)-99.5 F (37.5 C)] 98.9 F (37.2 C) (06/12 0410) Pulse Rate:  [66-71] 69 (06/12 0410) Resp:  [18] 18 (06/12 0410) BP: (127-155)/(61-83) 127/83 mmHg (06/12 0410) SpO2:  [96 %-100 %] 96 % (06/12 0853) Last BM Date: 09/12/15 General:   Pleasant African American female appears to be in NAD, Well developed, Well nourished, alert and cooperative Head:  Normocephalic and atraumatic. Eyes:   PEERL, EOMI. No icterus. Conjunctiva pink. Ears:  Normal auditory acuity. Neck:  Supple Throat: Oral cavity and pharynx without inflammation, swelling or lesion. Teeth in good condition. Lungs: Respirations even and unlabored. Lungs clear to auscultation bilaterally.   No wheezes, crackles, or rhonchi.  Heart: Normal S1, S2. No MRG. Regular rate and rhythm.  Abdomen:  Soft, nondistended, nontender. No rebound or guarding. Normal bowel sounds. No appreciable masses or hepatomegaly. No abdominal distension.  Rectal:  Not performed.  Msk:  Symmetrical without gross deformities. Peripheral pulses intact.  Extremities:  1+ edema bilaterally, no deformity or joint abnormality. Normal ROM, normal sensation. Right foot with dressing Neurologic:  Alert and  oriented x4;  grossly normal neurologically. CN II-XII intact.  Skin:   Dry and intact without significant lesions or rashes. Psychiatric: Oriented to person, place and time. Demonstrates good judgement and reason without abnormal affect or behaviors.  LAB RESULTS:  Recent Labs  09/10/15 1213 09/11/15 2351 09/12/15 0040 09/13/15 0358  WBC 19.1* 18.7*  --  16.5*  HGB  --  10.8* 11.9* 8.7*  HCT 32.5* 31.1* 35.0* 25.4*  PLT 337 335  --  273   BMET  Recent Labs  09/10/15 1213 09/11/15 2351 09/12/15 0040 09/13/15 0358  NA 145* 139 142 142  K 4.0 5.3* 5.0 3.1*  CL 102 105 104 110  CO2 22 25  --  25  GLUCOSE 111* 115* 108* 82  BUN 17 19 25* 16  CREATININE 1.64*  1.52* 1.50* 1.38*  CALCIUM 9.8 9.7  --  8.4*   LFT  Recent Labs  09/11/15 2351  PROT 7.6  ALBUMIN 3.7  AST 43*  ALT 24  ALKPHOS 59  BILITOT 1.2   PT/INR  Recent Labs  09/13/15 0848  LABPROT 17.0*  INR 1.37    STUDIES: Dg Chest 2 View  09/11/2015  CLINICAL DATA:  59-year-old female with shortness of breath and tachypnea EXAM: CHEST  2 VIEW COMPARISON:  Chest radiograph dated 08/26/2015 FINDINGS: Two views of the chest demonstrate a small left pleural effusion, new from prior study. There is associated partial compressive atelectasis of the left lung base. Pneumonia is not excluded. The right lung is clear. There is no pneumothorax. Top-normal cardiac silhouette. There is degenerative changes of the shoulders. No acute osseous pathology. IMPRESSION: Small left pleural effusion with left lung base atelectasis versus infiltrate. Electronically Signed   By: Anner Crete M.D.   On: 09/11/2015 23:56   Ct Abdomen Pelvis W Contrast  09/12/2015  CLINICAL DATA:  82 year old female with abdominal pain. History of small-bowel obstruction. EXAM: CT ABDOMEN AND PELVIS WITH CONTRAST TECHNIQUE: Multidetector CT imaging of the abdomen and pelvis was performed using the standard protocol following bolus administration of intravenous contrast. CONTRAST:  31mL ISOVUE-300 IOPAMIDOL (ISOVUE-300) INJECTION 61% COMPARISON:  CT dated 08/26/2015 is FINDINGS: There is a partially visualized small left pleural effusion. Patchy areas of airspace density at the lung bases as well as multiple ground-glass and nodular density at the left lung base are concerning for pneumonia. Clinical correlation is recommended. There is coronary vascular calcification. No intra-abdominal free air.  No free fluid. Cholecystectomy. Multiple small scattered hepatic hypodense lesions of measuring up to 9 mm are not well characterized but likely represent cysts or hemangioma. Cholecystectomy. The pancreas, and the adrenal glands appear  unremarkable. Subcentimeter splenic hypodense lesion is not characterized but likely represents a cyst or hemangioma. There is no hydronephrosis on either side. The visualized ureters appear grossly unremarkable. The urinary bladder is partially distended. There is mild haziness of the bladder wall.  Correlation with urinalysis recommended to exclude cystitis. The uterus is grossly unremarkable. There is mild thickened appearance of the distal esophagus which may represent esophagitis or esophageal irritation related to reflux. The stomach is distended with gastric contents, likely related to the reported chronically impaired gastric emptying. There is thickened appearance of the gastric antrum. There is mild haziness of the proximal duodenum concerning for duodenitis. There is postsurgical changes of bowel with duodenojejunal anastomosis. There is no evidence of small-bowel obstruction. There are scattered sigmoid and colonic diverticula without active inflammatory changes. There is postsurgical changes of partial sigmoid resection with anastomotic suture. There is advanced aortoiliac atherosclerotic disease. The origins of the celiac axis, SMA, and IMA appear patent. No portal venous gas identified. There is no adenopathy. There is midline vertical anterior pelvic wall incisional scar. There is a 5 cm defect in the anterior peritoneal wall along the surgical incision with protrusion of a short segment of the colon. A small fat containing left para incisional hernia noted. There is diffuse stranding of the subcutaneous fat. No fluid collection or hematoma. There is osteopenia with multilevel degenerative changes of the spine. T11 compression deformity similar to prior study. No acute fracture. IMPRESSION: Small left pleural effusion with findings concerning for bibasilar pneumonia. Clinical correlation and follow-up recommended. Severe gastric distention likely related to chronic gastric dysmotility or partial  gastric outlet obstruction. Clinical correlation is recommended. There has been interval increase in the gastric distension compared the prior CT. Mild haziness of the proximal duodenum concerning for duodenitis. No evidence of small-bowel obstruction. Esophagitis. Colonic diverticulosis without active inflammatory changes. Electronically Signed   By: Anner Crete M.D.   On: 09/12/2015 00:33   Dg Abd Portable 1v  09/12/2015  CLINICAL DATA:  Nausea. EXAM: PORTABLE ABDOMEN - 1 VIEW COMPARISON:  CT scan from yesterday FINDINGS: There is a paucity of bowel gas which limits evaluation. No evidence of small bowel obstruction on this study. The stomach may still be distended with debris. Contrast is seen in the left renal collecting system from yesterday's CT scan. No other acute abnormalities. IMPRESSION: No small bowel obstruction identified. The stomach is not as well evaluated on this study but may still be distended with debris. Electronically Signed   By: Dorise Bullion III M.D   On: 09/12/2015 07:07     PREVIOUS ENDOSCOPIES:            See HPI above   Impression / Plan:  Impression: 1. Nausea and vomiting in patient with history of the same: Complicated patient with multiple abdominal surgeries, history of small bowel obstructions, pyloric stenosis, functional gastric outlet obstruction; Candida esophagitis on latest EGD on 07/08/2015; recent admission with improvement of symptoms after NG tube-patient returned hospital 4 days after last discharge with continued symptoms; CT abdomen and pelvis at time of admission showed small left pleural effusion with findings concerning for bibasilar pneumonia, severe gastric distention likely related to chronic gastric dysmotility or partial gastric outlet obstruction, mild haziness of the proximal duodenum concerning for duodenitis with no evidence of small bowel obstruction, esophagitis and colonic diverticulosis without active inflammatory changes-consider  repeat GOO possibly caused by duodenitis seen on recent CT? 2. Constipation: Controlled on twice a day MiraLAX at home, per daughter this causes her to be nauseous and having a lot of gas.  Plan: 1. Ordered NG tube placement, to wall with low intermittent suction 2. Discussed above with patient and her family, they are eager to find out if something can be  done permanently to resolve the patient's problems. 3. Continue supportive measures 4. Nothing by mouth 5. Will discuss above with Dr. Havery Moros, please await any further recommendations  Thank you for your kind consultation, we will continue to follow.  Lavone Nian [facility]  09/13/2015, 10:51 AM

## 2015-09-13 NOTE — Progress Notes (Signed)
PROGRESS NOTE                                                                                                                                                                                                             Patient Demographics:    Shannon [name], is a 82 y.o. female, DOB - Jul 14, 1923, WNU:[id number]  Admit date - 09/11/2015   Admitting Physician Briscoe Deutscher, MD  Outpatient Primary MD for the patient is Kirt Boys, DO  LOS - 1  Chief Complaint  Patient presents with  . Abdominal Pain       Brief Narrative    Shannon [name] is a medically complex 82 y.o. female with medical history significant for hypertension, hyperlipidemia, hypothyroidism, peripheral arterial disease with ischemic toe complicated by osteomyelitis, and chronic gastric distention with recurrent nausea and vomiting who presents to the ED for evaluation of abdominal pain with nausea and vomiting of 2 days' duration. Patient had a recent admission, just discharged on 09/05/2015 following management of abdominal pain with nausea and vomiting. She had an NG tube placed at that time and was started on liquid diet, essentially improving spontaneously and tolerating full diet. She was also noted to have an ischemic third toe on the right foot secondary to PAD and workup revealed underlying osteomyelitis. Surgery was advised for management of this, however patient declined. She was discharged with a one-month course of Augmentin. She had initially done well back at home before the insidious development of recurrent abdominal pain with nausea and vomiting. She reports the vomitus as nonbloody and nonbilious. There's been no diarrhea; in fact she has not had a bowel movement in 2-3 days. She denies chest pain or palpitations, but does endorse dyspnea with minimal exertion. She denies any significant cough.  ED Course: Upon arrival to the ED, patient is found to  have temperature elevated to 37.9 C, saturating low 90s on room air at rest, hypertensive to 180/85, and with normal heart rate. Chest x-ray each interspace small left pleural effusion and a infiltrate in the left base. CT of the abdomen and pelvis was obtained and notable for severe gastric distention, likely due to chronic dysmotility or partial gastric outlet obstruction. Also noted on CT is mild haziness around the duodenum consistent with duodenitis. The lung bases are visualized on the CT and findings consistent  with bibasilar pneumonia. Chemistry panels notable for a potassium of 5.3 and creatinine 1.52, up from 0.982 months prior. CBC features a leukocytosis to 18,700 and hemoglobin of 10.8 with normal MCV. Lactic acid is reassuring at 1.52 and urinalysis is unremarkable. A 1 L bolus of normal saline was administered and blood and urine cultures were obtained. Patient was started on empiric vancomycin and Zosyn for suspected healthcare associated pneumonia. Symptomatic care was provided with Tylenol, fentanyl, and Zofran. Patient remained hemodynamically stable in the emergency department and will be admitted to telemetry for ongoing evaluation and management of HCAP, abdominal pain, nausea, and vomiting, with acute kidney injury and mild hyperkalemia    Subjective:    Shannon [name] today has, No headache, No chest pain, No abdominal pain - No Nausea, No new weakness tingling or numbness, No Cough - SOB.  She overall does not want to have any major surgeries. She is okay for EGD.   Assessment  & Plan :     1. HCAP - likely aspiration, from gastric outlet obstruction, appears nontoxic currently afebrile, for 24 hours continue vancomycin and cefepime, monitor cultures. If stable will taper down to Unasyn.  2. Gastric outlet obstruction due to pyloric stenosis. GI consultedOn 09/12/2015, we await evaluation, question repeat EGD with dilation, currently nothing by mouth except medications, she follows  with Dr. Marina Goodell. Note she is on Eliquis. Eliquis will be held on 09/13/2015.  3. Right third toe gangrene. On antibiotics, refused amputation last admission, again refuses, confirmed with her daughter. Suppressive antibiotic course ongoing for 30 days. After antibiotic course she wants to continue with wound care and supportive care. If worst daughter requested to consider comfort care.  4. Dyslipidemia. On statin.  5. Depression. On Zoloft.  6. Hypothyroidism recently Synthroid increased to 88 g few days ago, repeat TSH in 4 weeks from now.  7. GERD. On PPI  8. Paroxysmal atrial fibrillation with RVR [place] vasc 2 score of 5. Continue amiodarone, Coreg and Eliquis. We will bridge her with heparin as she may undergo EGD today or tomorrow, last Eliquis does 09/12/2015 evening.  9. History of asthma COPD. Stable.  10. Hypokalemia. Replaced.     Code Status :  Partial with no CPR or intubation  Family Communication  :  Discussed with daughter in detail on 09/12/2015 on the phone  Disposition Plan  :  Stay inpatient for now  Consults  :  GI  Procedures  :    CT chest. Possible pneumonia, gastric distention  DVT Prophylaxis  :  Eliquis  Lab Results  Component Value Date   PLT 273 09/13/2015    Inpatient Medications  Scheduled Meds: . amiodarone  100 mg Oral Daily  . amLODipine  10 mg Oral Daily  . apixaban  2.5 mg Oral BID  . atorvastatin  10 mg Oral q1800  . calcium-vitamin D  1 tablet Oral Q breakfast  . carvedilol  3.125 mg Oral BID WC  . ceFEPime (MAXIPIME) IV  1 g Intravenous QHS  . chlorhexidine  15 mL Mouth/Throat BID  . cycloSPORINE  1 drop Both Eyes BID  . feeding supplement  1 Container Oral TID BM  . fluticasone  2 spray Each Nare Daily  . gabapentin  300 mg Oral Daily  . gabapentin  600 mg Oral 2 times per day  . hydrALAZINE  50 mg Oral QID  . isosorbide mononitrate  30 mg Oral Daily  . latanoprost  1 drop Both Eyes QHS  .  levothyroxine  88 mcg Oral QAC  breakfast  . mometasone-formoterol  2 puff Inhalation BID  . pantoprazole  40 mg Oral BID  . polyethylene glycol  17 g Oral BID  . polyvinyl alcohol  1 drop Both Eyes QID  . potassium chloride  10 mEq Intravenous Q1 Hr x 4  . senna-docusate  1 tablet Oral BID  . sertraline  100 mg Oral Daily  . tamsulosin  0.4 mg Oral Daily  . vancomycin  750 mg Intravenous Q24H   Continuous Infusions:   PRN Meds:.acetaminophen, albuterol, diclofenac sodium, lactulose, ondansetron, traMADol  Antibiotics  :    Anti-infectives    Start     Dose/Rate Route Frequency Ordered Stop   09/13/15 0600  vancomycin (VANCOCIN) IVPB 750 mg/150 ml premix     750 mg 150 mL/hr over 60 Minutes Intravenous Every 24 hours 09/12/15 0546     09/12/15 2200  ceFEPIme (MAXIPIME) 1 g in dextrose 5 % 50 mL IVPB     1 g 100 mL/hr over 30 Minutes Intravenous Daily at bedtime 09/12/15 0410 09/20/15 2159   09/12/15 0430  ceFEPIme (MAXIPIME) 1 g in dextrose 5 % 50 mL IVPB     1 g 100 mL/hr over 30 Minutes Intravenous  Once 09/12/15 0427 09/12/15 0540   09/12/15 0100  vancomycin (VANCOCIN) IVPB 1000 mg/200 mL premix     1,000 mg 200 mL/hr over 60 Minutes Intravenous  Once 09/12/15 0046 09/12/15 0400   09/12/15 0100  piperacillin-tazobactam (ZOSYN) IVPB 3.375 g     3.375 g 100 mL/hr over 30 Minutes Intravenous  Once 09/12/15 0046 09/12/15 0222         Objective:   Filed Vitals:   09/12/15 1910 09/12/15 2142 09/13/15 0410 09/13/15 0853  BP:  130/61 127/83   Pulse:  71 69   Temp:  99.5 F (37.5 C) 98.9 F (37.2 C)   TempSrc:  Oral Oral   Resp:   18   Height:      Weight:      SpO2: 99% 99% 99% 96%    Wt Readings from Last 3 Encounters:  09/12/15 66.724 kg (147 lb 1.6 oz)  09/07/15 68.493 kg (151 lb)  08/26/15 68.357 kg (150 lb 11.2 oz)     Intake/Output Summary (Last 24 hours) at 09/13/15 0859 Last data filed at 09/13/15 0421  Gross per 24 hour  Intake   2372 ml  Output    500 ml  Net   1872 ml      Physical Exam  Awake Alert, Oriented X 3, No new F.N deficits, Normal affect .AT,PERRAL Supple Neck,No JVD, No cervical lymphadenopathy appriciated.  Symmetrical Chest wall movement, Good air movement bilaterally, CTAB RRR,No Gallops,Rubs or new Murmurs, No Parasternal Heave +ve B.Sounds, Abd distended, Mild epigastric tenderness, No organomegaly appriciated, No rebound - guarding or rigidity. No Cyanosis, Clubbing or edema, No new Rash or bruise, Right 3rd toe is black and atrophied    Data Review:    CBC  Recent Labs Lab 09/10/15 1213 09/11/15 2351 09/12/15 0040 09/13/15 0358  WBC 19.1* 18.7*  --  16.5*  HGB  --  10.8* 11.9* 8.7*  HCT 32.5* 31.1* 35.0* 25.4*  PLT 337 335  --  273  MCV 99* 96.6  --  94.8  MCH 32.4 33.5  --  32.5  MCHC 32.6 34.7  --  34.3  RDW 18.1* 18.2*  --  18.1*  LYMPHSABS 1.3 1.1  --   --  MONOABS  --  0.7  --   --   EOSABS 9.3* 8.2*  --   --   BASOSABS 0.1 0.0  --   --     Chemistries   Recent Labs Lab 09/10/15 1213 09/11/15 2351 09/12/15 0040 09/13/15 0358  NA 145* 139 142 142  K 4.0 5.3* 5.0 3.1*  CL 102 105 104 110  CO2 22 25  --  25  GLUCOSE 111* 115* 108* 82  BUN 17 19 25* 16  CREATININE 1.64* 1.52* 1.50* 1.38*  CALCIUM 9.8 9.7  --  8.4*  AST  --  43*  --   --   ALT  --  24  --   --   ALKPHOS  --  59  --   --   BILITOT  --  1.2  --   --    ------------------------------------------------------------------------------------------------------------------ No results for input(s): CHOL, HDL, LDLCALC, TRIG, CHOLHDL, LDLDIRECT in the last 72 hours.  Lab Results  Component Value Date   HGBA1C 6.5* 08/28/2015   ------------------------------------------------------------------------------------------------------------------  Recent Labs  09/10/15 1213  TSH 58.260*   ------------------------------------------------------------------------------------------------------------------ No results for input(s): VITAMINB12,  FOLATE, FERRITIN, TIBC, IRON, RETICCTPCT in the last 72 hours.  Coagulation profile No results for input(s): INR, PROTIME in the last 168 hours.  No results for input(s): DDIMER in the last 72 hours.  Cardiac Enzymes No results for input(s): CKMB, TROPONINI, MYOGLOBIN in the last 168 hours.  Invalid input(s): CK ------------------------------------------------------------------------------------------------------------------    Component Value Date/Time   BNP 39.0 08/26/2015 0630    Micro Results No results found for this or any previous visit (from the past 240 hour(s)).  Radiology Reports   Ct Abdomen Pelvis W Contrast  09/12/2015  CLINICAL DATA:  82 year old female with abdominal pain. History of small-bowel obstruction. EXAM: CT ABDOMEN AND PELVIS WITH CONTRAST TECHNIQUE: Multidetector CT imaging of the abdomen and pelvis was performed using the standard protocol following bolus administration of intravenous contrast. CONTRAST:  80mL ISOVUE-300 IOPAMIDOL (ISOVUE-300) INJECTION 61% COMPARISON:  CT dated 08/26/2015 is FINDINGS: There is a partially visualized small left pleural effusion. Patchy areas of airspace density at the lung bases as well as multiple ground-glass and nodular density at the left lung base are concerning for pneumonia. Clinical correlation is recommended. There is coronary vascular calcification. No intra-abdominal free air.  No free fluid. Cholecystectomy. Multiple small scattered hepatic hypodense lesions of measuring up to 9 mm are not well characterized but likely represent cysts or hemangioma. Cholecystectomy. The pancreas, and the adrenal glands appear unremarkable. Subcentimeter splenic hypodense lesion is not characterized but likely represents a cyst or hemangioma. There is no hydronephrosis on either side. The visualized ureters appear grossly unremarkable. The urinary bladder is partially distended. There is mild haziness of the bladder wall. Correlation with  urinalysis recommended to exclude cystitis. The uterus is grossly unremarkable. There is mild thickened appearance of the distal esophagus which may represent esophagitis or esophageal irritation related to reflux. The stomach is distended with gastric contents, likely related to the reported chronically impaired gastric emptying. There is thickened appearance of the gastric antrum. There is mild haziness of the proximal duodenum concerning for duodenitis. There is postsurgical changes of bowel with duodenojejunal anastomosis. There is no evidence of small-bowel obstruction. There are scattered sigmoid and colonic diverticula without active inflammatory changes. There is postsurgical changes of partial sigmoid resection with anastomotic suture. There is advanced aortoiliac atherosclerotic disease. The origins of the celiac axis, SMA, and IMA appear patent.  No portal venous gas identified. There is no adenopathy. There is midline vertical anterior pelvic wall incisional scar. There is a 5 cm defect in the anterior peritoneal wall along the surgical incision with protrusion of a short segment of the colon. A small fat containing left para incisional hernia noted. There is diffuse stranding of the subcutaneous fat. No fluid collection or hematoma. There is osteopenia with multilevel degenerative changes of the spine. T11 compression deformity similar to prior study. No acute fracture. IMPRESSION: Small left pleural effusion with findings concerning for bibasilar pneumonia. Clinical correlation and follow-up recommended. Severe gastric distention likely related to chronic gastric dysmotility or partial gastric outlet obstruction. Clinical correlation is recommended. There has been interval increase in the gastric distension compared the prior CT. Mild haziness of the proximal duodenum concerning for duodenitis. No evidence of small-bowel obstruction. Esophagitis. Colonic diverticulosis without active inflammatory  changes. Electronically Signed   By: Elgie Collard M.D.   On: 09/12/2015 00:33     Dg Abd Portable 1v  09/12/2015  CLINICAL DATA:  Nausea. EXAM: PORTABLE ABDOMEN - 1 VIEW COMPARISON:  CT scan from yesterday FINDINGS: There is a paucity of bowel gas which limits evaluation. No evidence of small bowel obstruction on this study. The stomach may still be distended with debris. Contrast is seen in the left renal collecting system from yesterday's CT scan. No other acute abnormalities. IMPRESSION: No small bowel obstruction identified. The stomach is not as well evaluated on this study but may still be distended with debris. Electronically Signed   By: Gerome Sam III M.D   On: 09/12/2015 07:07      Time Spent in minutes  30   Susa Raring K M.D on 09/13/2015 at 8:59 AM  Between 7am to 7pm - Pager - [phone]  After 7pm go to [email or web address] - password [facility]  [facility] -  Office  [phone]

## 2015-09-13 NOTE — Progress Notes (Signed)
ANTICOAGULATION CONSULT NOTE - Initial Consult  Pharmacy Consult for heparin Indication: atrial fibrillation  No Known Allergies  Patient Measurements: Height: 5\' 6"  (167.6 cm) Weight: 147 lb 1.6 oz (66.724 kg) IBW/kg (Calculated) : 59.3 Heparin Dosing Weight: 67kg  Vital Signs: Temp: 98.9 F (37.2 C) (06/12 0410) Temp Source: Oral (06/12 0410) BP: 127/83 mmHg (06/12 0410) Pulse Rate: 69 (06/12 0410)  Labs:  Recent Labs  09/10/15 1213  09/11/15 2351 09/12/15 0040 09/13/15 0358 09/13/15 0848  HGB  --   < > 10.8* 11.9* 8.7*  --   HCT 32.5*  --  31.1* 35.0* 25.4*  --   PLT 337  --  335  --  273  --   LABPROT  --   --   --   --   --  17.0*  INR  --   --   --   --   --  1.37  CREATININE 1.64*  --  1.52* 1.50* 1.38*  --   < > = values in this interval not displayed.  Estimated Creatinine Clearance: 25.4 mL/min (by C-G formula based on Cr of 1.38).   Medical History: Past Medical History  Diagnosis Date  . Pneumonia   . Hypertension   . Small bowel obstruction ([place])     "I don't know how many" (01/11/2015)  . Ventral hernia with bowel obstruction   . Asthma   . Hyperlipidemia   . Stroke ([place])     "light one"  . COPD (chronic obstructive pulmonary disease) ([place])   . Hypothyroidism   . Type II diabetes mellitus ([place])   . Anemia     previous blood transfusions  . GERD (gastroesophageal reflux disease)   . History of stomach ulcers   . Arthritis     "all over"  . Renal insufficiency   . Depression     "light case"  . SIADH (syndrome of inappropriate ADH production) ([place])     [name] 01/10/2015  . Perforated gastric ulcer ([place])   . Gastric stenosis   . Paraesophageal hernia   . Bradycardia     topped BB and amiodarone decreased- junctional rhythm    Assessment: 89 YOF with afib on apixaban 2.5mg  PO BID (last dose 6/11 at 23:09).  Pharmacy asked to bridge with heparin gtt pending GI evaluation for gastric outlet obstruction due to pyloric stenosis and  possible intervention.    Today, 09/13/2015  CBC: hgb decreased, pltc WNL  Baseline heparin level = 1.12 (elevated as expected with eliquis therapy)   Baseline aPTT = 33 sec  Goal of Therapy:  Heparin level 0.3-0.7 units/ml, aPTT 66-102 sec Monitor platelets by anticoagulation protocol: Yes   Plan:   Since on anti-Xa inhibitor, will need baseline aPTT and anti-Xa level (heparin level)  At ~11am start heparin at 900 units/hr  Check 8hr heparin level and aPTT  Daily CBC, heparin level (and aPTT until apixaban effects gone)  Doreene Eland, PharmD, BCPS.   Pager: [phone] 09/13/2015 9:22 AM

## 2015-09-13 NOTE — Progress Notes (Signed)
PHARMACIST - PHYSICIAN COMMUNICATION CONCERNING:  IV heparin  55 yoF on chronic anticoagulation with apixaban for Afib, bridged to IV heparin today for possible procedures.  Please see note written by Doreene Eland, PharmD, for further details.    Checking aPTT and HL until they correlate.  HL will be falsely elevated until apixaban systemically cleared.   Heparin infusion currently 900 units/hr.  No issues with infusions and no signs/symptoms of bleeding per RN.   1930 levels:  HL = 1.10 falsely high (goal 0.3-0.7) APTT = 98  therapeutic at high end of range (66-102s)  RECOMMENDATION: Reduce heparin infusion to 800 units/hr.  Recheck levels in AM.   Ralene Bathe, PharmD, BCPS 09/13/2015, 8:44 PM  Pager: [phone]

## 2015-09-14 LAB — APTT
aPTT: 146 seconds — ABNORMAL HIGH (ref 24–37)
aPTT: 81 seconds — ABNORMAL HIGH (ref 24–37)
aPTT: 93 seconds — ABNORMAL HIGH (ref 24–37)

## 2015-09-14 LAB — CBC
HCT: 27.7 % — ABNORMAL LOW (ref 36.0–46.0)
Hemoglobin: 9.4 g/dL — ABNORMAL LOW (ref 12.0–15.0)
MCH: 32.3 pg (ref 26.0–34.0)
MCHC: 33.9 g/dL (ref 30.0–36.0)
MCV: 95.2 fL (ref 78.0–100.0)
Platelets: 306 10*3/uL (ref 150–400)
RBC: 2.91 MIL/uL — ABNORMAL LOW (ref 3.87–5.11)
RDW: 18.2 % — ABNORMAL HIGH (ref 11.5–15.5)
WBC: 15.7 10*3/uL — ABNORMAL HIGH (ref 4.0–10.5)

## 2015-09-14 LAB — BASIC METABOLIC PANEL
Anion gap: 8 (ref 5–15)
BUN: 13 mg/dL (ref 6–20)
CO2: 21 mmol/L — ABNORMAL LOW (ref 22–32)
Calcium: 8.5 mg/dL — ABNORMAL LOW (ref 8.9–10.3)
Chloride: 112 mmol/L — ABNORMAL HIGH (ref 101–111)
Creatinine, Ser: 1.22 mg/dL — ABNORMAL HIGH (ref 0.44–1.00)
GFR calc Af Amer: 44 mL/min — ABNORMAL LOW (ref >60)
GFR calc non Af Amer: 38 mL/min — ABNORMAL LOW (ref >60)
Glucose, Bld: 56 mg/dL — ABNORMAL LOW (ref 65–99)
Potassium: 3.6 mmol/L (ref 3.5–5.1)
Sodium: 141 mmol/L (ref 135–145)

## 2015-09-14 LAB — MRSA PCR SCREENING: MRSA by PCR: NEGATIVE

## 2015-09-14 LAB — HEPARIN LEVEL (UNFRACTIONATED)
Heparin Unfractionated: 0.88 IU/mL — ABNORMAL HIGH (ref 0.30–0.70)
Heparin Unfractionated: 1 IU/mL — ABNORMAL HIGH (ref 0.30–0.70)
Heparin Unfractionated: 1.22 IU/mL — ABNORMAL HIGH (ref 0.30–0.70)

## 2015-09-14 LAB — MAGNESIUM: Magnesium: 1.6 mg/dL — ABNORMAL LOW (ref 1.7–2.4)

## 2015-09-14 MED ORDER — MAGNESIUM SULFATE 2 GM/50ML IV SOLN
2.0000 g | Freq: Once | INTRAVENOUS | Status: AC
  Administered 2015-09-14: 2 g via INTRAVENOUS
  Filled 2015-09-14: qty 50

## 2015-09-14 MED ORDER — POTASSIUM CHLORIDE IN NACL 20-0.9 MEQ/L-% IV SOLN
INTRAVENOUS | Status: DC
  Administered 2015-09-14 – 2015-09-15 (×2): via INTRAVENOUS
  Filled 2015-09-14 (×2): qty 1000

## 2015-09-14 NOTE — Progress Notes (Addendum)
ANTICOAGULATION CONSULT NOTE - Initial Consult  Pharmacy Consult for heparin Indication: atrial fibrillation  No Known Allergies  Patient Measurements: Height: 5\' 6"  (167.6 cm) Weight: 147 lb 1.6 oz (66.724 kg) IBW/kg (Calculated) : 59.3 Heparin Dosing Weight: 67kg  Vital Signs: Temp: 98.2 F (36.8 C) (06/13 1400) Temp Source: Oral (06/13 1400) BP: 145/57 mmHg (06/13 1400) Pulse Rate: 70 (06/13 1400)  Labs:  Recent Labs  09/11/15 2351 09/12/15 0040 09/13/15 0358  09/13/15 0848 09/13/15 1931 09/14/15 0435 09/14/15 1540  HGB 10.8* 11.9* 8.7*  --   --   --  9.4*  --   HCT 31.1* 35.0* 25.4*  --   --   --  27.7*  --   PLT 335  --  273  --   --   --  306  --   APTT  --   --   --   < > 33 98* 146* 81*  LABPROT  --   --   --   --  17.0*  --   --   --   INR  --   --   --   --  1.37  --   --   --   HEPARINUNFRC  --   --   --   < > 1.12* 1.10* 0.88* 1.00*  CREATININE 1.52* 1.50* 1.38*  --   --   --  1.22*  --   < > = values in this interval not displayed.  Estimated Creatinine Clearance: 28.7 mL/min (by C-G formula based on Cr of 1.22).   Medical History: Past Medical History  Diagnosis Date  . Pneumonia   . Hypertension   . Small bowel obstruction ([place])     "I don't know how many" (01/11/2015)  . Ventral hernia with bowel obstruction   . Asthma   . Hyperlipidemia   . Stroke ([place])     "light one"  . COPD (chronic obstructive pulmonary disease) ([place])   . Hypothyroidism   . Type II diabetes mellitus ([place])   . Anemia     previous blood transfusions  . GERD (gastroesophageal reflux disease)   . History of stomach ulcers   . Arthritis     "all over"  . Renal insufficiency   . Depression     "light case"  . SIADH (syndrome of inappropriate ADH production) ([place])     [name] 01/10/2015  . Perforated gastric ulcer ([place])   . Gastric stenosis   . Paraesophageal hernia   . Bradycardia     topped BB and amiodarone decreased- junctional rhythm    Assessment: 89 YOF  with afib on apixaban 2.5mg  PO BID PTA. She presented to the ED on 6/11 with c/o abdominal pain and n/v. Abd/pelvis CT showed servere gastric distension with suspicion for chronic gastric dysmotility or partial gastric outlet obstruction. GI suspects bezoar with plan to proceed with EGD to r/o obstruction on 6/14.    Apixaban was started on admission, d/ced on  6/11( with last at 2309) and changed to heparin on 6/12 in anticipation for GI procedure.  Today, 09/14/2015  Hgb low at 9.4 but relatively stable, plt wnl  heparin level = 1.00 (elevated as expected with eliquis therapy)   aPTT = 81 (therapeutic-- this is more reliable while patient still has residual apixaban in system)  Goal of Therapy:  Heparin level 0.3-0.7 units/ml, aPTT 66-102 sec Monitor platelets by anticoagulation protocol: Yes   Plan:   Continue heparin drip at 650 units/hr  Will re-check heparin level and aPTT at 11pm to confirm before changing to daily monitoring  Daily CBC, heparin level (and aPTT until apixaban effects gone)  Dia Sitter, PharmD, BCPS 09/14/2015 5:05 PM

## 2015-09-14 NOTE — Progress Notes (Signed)
ANTICOAGULATION CONSULT NOTE - Follow Up Consult  Pharmacy Consult for Heparin Indication: atrial fibrillation, apixaban bridging  No Known Allergies  Patient Measurements: Height: 5\' 6"  (167.6 cm) Weight: 147 lb 1.6 oz (66.724 kg) IBW/kg (Calculated) : 59.3 Heparin Dosing Weight:   Vital Signs: Temp: 98.4 F (36.9 C) (06/13 0427) Temp Source: Oral (06/13 0427) BP: 149/73 mmHg (06/13 0427) Pulse Rate: 76 (06/13 0427)  Labs:  Recent Labs  09/11/15 2351 09/12/15 0040 09/13/15 0358 09/13/15 0848 09/13/15 1931 09/14/15 0435  HGB 10.8* 11.9* 8.7*  --   --  9.4*  HCT 31.1* 35.0* 25.4*  --   --  27.7*  PLT 335  --  273  --   --  306  APTT  --   --   --  33 98* 146*  LABPROT  --   --   --  17.0*  --   --   INR  --   --   --  1.37  --   --   HEPARINUNFRC  --   --   --  1.12* 1.10* 0.88*  CREATININE 1.52* 1.50* 1.38*  --   --  1.22*    Estimated Creatinine Clearance: 28.7 mL/min (by C-G formula based on Cr of 1.22).   Medications:  Infusions:  . sodium chloride 75 mL/hr at 09/14/15 0417  . heparin 800 Units/hr (09/13/15 2140)    Assessment: Patient with PTT and heparin both high.  No heparin issues per RN.  PTT ordered with Heparin level until both correlate due to possible drug-lab interaction between oral anticoagulant (rivaroxaban, edoxaban, or apixaban) and anti-Xa level (aka heparin level)   Goal of Therapy:  Heparin level 0.3-0.7 units/ml aPTT 66-102 seconds Monitor platelets by anticoagulation protocol: Yes   Plan:  Decrease heparin to 650 units/hr Recheck level at [place] 09/14/2015,6:08 AM

## 2015-09-14 NOTE — Progress Notes (Signed)
PROGRESS NOTE                                                                                                                                                                                                             Patient Demographics:    Shannon [name], is a 82 y.o. female, DOB - 17-Aug-1923, JOA:[id number]  Admit date - 09/11/2015   Admitting Physician Briscoe Deutscher, MD  Outpatient Primary MD for the patient is Kirt Boys, DO  LOS - 2  Chief Complaint  Patient presents with  . Abdominal Pain       Brief Narrative    Shannon [name] is a medically complex 82 y.o. female with medical history significant for hypertension, hyperlipidemia, hypothyroidism, peripheral arterial disease with ischemic toe complicated by osteomyelitis, and chronic gastric distention with recurrent nausea and vomiting who presents to the ED for evaluation of abdominal pain with nausea and vomiting of 2 days' duration. Patient had a recent admission, just discharged on 09/05/2015 following management of abdominal pain with nausea and vomiting. She had an NG tube placed at that time and was started on liquid diet, essentially improving spontaneously and tolerating full diet. She was also noted to have an ischemic third toe on the right foot secondary to PAD and workup revealed underlying osteomyelitis. Surgery was advised for management of this, however patient declined. She was discharged with a one-month course of Augmentin. She had initially done well back at home before the insidious development of recurrent abdominal pain with nausea and vomiting. She reports the vomitus as nonbloody and nonbilious. There's been no diarrhea; in fact she has not had a bowel movement in 2-3 days. She denies chest pain or palpitations, but does endorse dyspnea with minimal exertion. She denies any significant cough.  ED Course: Upon arrival to the ED, patient is found to  have temperature elevated to 37.9 C, saturating low 90s on room air at rest, hypertensive to 180/85, and with normal heart rate. Chest x-ray each interspace small left pleural effusion and a infiltrate in the left base. CT of the abdomen and pelvis was obtained and notable for severe gastric distention, likely due to chronic dysmotility or partial gastric outlet obstruction. Also noted on CT is mild haziness around the duodenum consistent with duodenitis. The lung bases are visualized on the CT and findings consistent  with bibasilar pneumonia. Chemistry panels notable for a potassium of 5.3 and creatinine 1.52, up from 0.982 months prior. CBC features a leukocytosis to 18,700 and hemoglobin of 10.8 with normal MCV. Lactic acid is reassuring at 1.52 and urinalysis is unremarkable. A 1 L bolus of normal saline was administered and blood and urine cultures were obtained. Patient was started on empiric vancomycin and Zosyn for suspected healthcare associated pneumonia. Symptomatic care was provided with Tylenol, fentanyl, and Zofran. Patient remained hemodynamically stable in the emergency department and will be admitted to telemetry for ongoing evaluation and management of HCAP, abdominal pain, nausea, and vomiting, with acute kidney injury and mild hyperkalemia    Subjective:    Shannon [name] today has, No headache, No chest pain, No abdominal pain - No Nausea, No new weakness tingling or numbness, No Cough - SOB.  She overall does not want to have any major surgeries. She is okay for EGD.   Assessment  & Plan :     1. HCAP - likely aspiration, from gastric outlet obstruction, appears nontoxic currently afebrile, for 24 hours continue vancomycin and cefepime, monitor cultures. If stable will taper down to Unasyn.  2. Gastric outlet obstruction due to pyloric stenosis. GI consultedOn 09/12/2015, we await evaluation, question repeat EGD with dilation, currently nothing by mouth except medications. Note she is  on Eliquis. Eliquis will be held on 09/13/2015.She has shown much a clinical improvement with NG tube placement, due for EGD on 09/14/2015.  3. Right third toe gangrene. On antibiotics, refused amputation last admission, again refuses, confirmed with her daughter. Suppressive antibiotic course ongoing for 30 days. After antibiotic course she wants to continue with wound care and supportive care. If worst daughter requested to consider comfort care.  4. Dyslipidemia. On statin.  5. Depression. On Zoloft.  6. Hypothyroidism recently Synthroid increased to 88 g few days ago, repeat TSH in 4 weeks from now.  7. GERD. On PPI  8. Paroxysmal atrial fibrillation with RVR [place] vasc 2 score of 5. Continue amiodarone, Coreg and Eliquis. We will bridge her with heparin as she may undergo EGD today or tomorrow, last Eliquis does 09/12/2015 evening.  9. History of asthma COPD. Stable.  10. Hypokalemia. Replaced.     Code Status :  Partial with no CPR or intubation  Family Communication  :  Discussed with daughter in detail on 09/12/2015 on the phone  Disposition Plan  :  Stay inpatient for now  Consults  :  GI  Procedures  :    CT chest. Possible pneumonia, gastric distention.  EGD due  DVT Prophylaxis  :  Eliquis/Heparin  Lab Results  Component Value Date   PLT 306 09/14/2015    Inpatient Medications  Scheduled Meds: . amiodarone  100 mg Oral Daily  . atorvastatin  10 mg Oral q1800  . carvedilol  3.125 mg Oral BID WC  . ceFEPime (MAXIPIME) IV  1 g Intravenous QHS  . chlorhexidine  15 mL Mouth/Throat BID  . cycloSPORINE  1 drop Both Eyes BID  . famotidine (PEPCID) IV  20 mg Intravenous Q12H  . fluticasone  2 spray Each Nare Daily  . gabapentin  300 mg Oral Daily  . gabapentin  600 mg Oral 2 times per day  . latanoprost  1 drop Both Eyes QHS  . levothyroxine  50 mcg Intravenous Daily  . mometasone-formoterol  2 puff Inhalation BID  . nitroGLYCERIN  0.5 inch Topical Q6H  .  polyvinyl alcohol  1  drop Both Eyes QID  . sertraline  100 mg Oral Daily  . tamsulosin  0.4 mg Oral Daily  . vancomycin  750 mg Intravenous Q24H   Continuous Infusions: . 0.9 % NaCl with KCl 20 mEq / L 75 mL/hr at 09/14/15 1103  . heparin 650 Units/hr (09/14/15 0623)   PRN Meds:.acetaminophen, albuterol, bisacodyl, diclofenac sodium, hydrALAZINE, metoprolol, ondansetron (ZOFRAN) IV  Antibiotics  :    Anti-infectives    Start     Dose/Rate Route Frequency Ordered Stop   09/13/15 0600  vancomycin (VANCOCIN) IVPB 750 mg/150 ml premix     750 mg 150 mL/hr over 60 Minutes Intravenous Every 24 hours 09/12/15 0546     09/12/15 2200  ceFEPIme (MAXIPIME) 1 g in dextrose 5 % 50 mL IVPB     1 g 100 mL/hr over 30 Minutes Intravenous Daily at bedtime 09/12/15 0410 09/20/15 2159   09/12/15 0430  ceFEPIme (MAXIPIME) 1 g in dextrose 5 % 50 mL IVPB     1 g 100 mL/hr over 30 Minutes Intravenous  Once 09/12/15 0427 09/12/15 0540   09/12/15 0100  vancomycin (VANCOCIN) IVPB 1000 mg/200 mL premix     1,000 mg 200 mL/hr over 60 Minutes Intravenous  Once 09/12/15 0046 09/12/15 0400   09/12/15 0100  piperacillin-tazobactam (ZOSYN) IVPB 3.375 g     3.375 g 100 mL/hr over 30 Minutes Intravenous  Once 09/12/15 0046 09/12/15 0222         Objective:   Filed Vitals:   09/13/15 1336 09/13/15 2130 09/14/15 0427 09/14/15 0842  BP: 155/72 130/74 149/73   Pulse: 79 75 76   Temp: 98.8 F (37.1 C) 98.7 F (37.1 C) 98.4 F (36.9 C)   TempSrc: Oral Oral Oral   Resp: 20 18 18    Height:      Weight:      SpO2: 93% 93% 100% 94%    Wt Readings from Last 3 Encounters:  09/12/15 66.724 kg (147 lb 1.6 oz)  09/07/15 68.493 kg (151 lb)  08/26/15 68.357 kg (150 lb 11.2 oz)     Intake/Output Summary (Last 24 hours) at 09/14/15 1153 Last data filed at 09/14/15 0700  Gross per 24 hour  Intake 2126.25 ml  Output   2525 ml  Net -398.75 ml     Physical Exam  Awake Alert, Oriented X 3, No new F.N  deficits, Normal affect [place].AT,PERRAL Supple Neck,No JVD, No cervical lymphadenopathy appriciated.  Symmetrical Chest wall movement, Good air movement bilaterally, CTAB RRR,No Gallops,Rubs or new Murmurs, No Parasternal Heave +ve B.Sounds, Abd distended, Mild epigastric tenderness, No organomegaly appriciated, No rebound - guarding or rigidity. No Cyanosis, Clubbing or edema, No new Rash or bruise, Right 3rd toe is black and atrophied    Data Review:    CBC  Recent Labs Lab 09/10/15 1213 09/11/15 2351 09/12/15 0040 09/13/15 0358 09/14/15 0435  WBC 19.1* 18.7*  --  16.5* 15.7*  HGB  --  10.8* 11.9* 8.7* 9.4*  HCT 32.5* 31.1* 35.0* 25.4* 27.7*  PLT 337 335  --  273 306  MCV 99* 96.6  --  94.8 95.2  MCH 32.4 33.5  --  32.5 32.3  MCHC 32.6 34.7  --  34.3 33.9  RDW 18.1* 18.2*  --  18.1* 18.2*  LYMPHSABS 1.3 1.1  --   --   --   MONOABS  --  0.7  --   --   --   EOSABS 9.3* 8.2*  --   --   --  BASOSABS 0.1 0.0  --   --   --     Chemistries   Recent Labs Lab 09/10/15 1213 09/11/15 2351 09/12/15 0040 09/13/15 0358 09/14/15 0435  NA 145* 139 142 142 141  K 4.0 5.3* 5.0 3.1* 3.6  CL 102 105 104 110 112*  CO2 22 25  --  25 21*  GLUCOSE 111* 115* 108* 82 56*  BUN 17 19 25* 16 13  CREATININE 1.64* 1.52* 1.50* 1.38* 1.22*  CALCIUM 9.8 9.7  --  8.4* 8.5*  MG  --   --   --   --  1.6*  AST  --  43*  --   --   --   ALT  --  24  --   --   --   ALKPHOS  --  59  --   --   --   BILITOT  --  1.2  --   --   --    ------------------------------------------------------------------------------------------------------------------ No results for input(s): CHOL, HDL, LDLCALC, TRIG, CHOLHDL, LDLDIRECT in the last 72 hours.  Lab Results  Component Value Date   HGBA1C 6.5* 08/28/2015   ------------------------------------------------------------------------------------------------------------------ No results for input(s): TSH, T4TOTAL, T3FREE, THYROIDAB in the last 72  hours.  Invalid input(s): FREET3 ------------------------------------------------------------------------------------------------------------------ No results for input(s): VITAMINB12, FOLATE, FERRITIN, TIBC, IRON, RETICCTPCT in the last 72 hours.  Coagulation profile  Recent Labs Lab 09/13/15 0848  INR 1.37    No results for input(s): DDIMER in the last 72 hours.  Cardiac Enzymes No results for input(s): CKMB, TROPONINI, MYOGLOBIN in the last 168 hours.  Invalid input(s): CK ------------------------------------------------------------------------------------------------------------------    Component Value Date/Time   BNP 39.0 08/26/2015 0630    Micro Results Recent Results (from the past 240 hour(s))  Urine culture     Status: Abnormal   Collection Time: 09/12/15 12:36 AM  Result Value Ref Range Status   Specimen Description URINE, CLEAN CATCH  Final   Special Requests NONE  Final   Culture (A)  Final    8,000 COLONIES/mL INSIGNIFICANT GROWTH Performed at [facility]    Report Status 09/13/2015 FINAL  Final  Culture, blood (Routine X 2) w Reflex to ID Panel     Status: None (Preliminary result)   Collection Time: 09/12/15  1:23 AM  Result Value Ref Range Status   Specimen Description BLOOD RIGHT ANTECUBITAL  Final   Special Requests BOTTLES DRAWN AEROBIC AND ANAEROBIC 5CC  Final   Culture   Final    NO GROWTH 1 DAY Performed at [facility]    Report Status PENDING  Incomplete  Culture, blood (Routine X 2) w Reflex to ID Panel     Status: None (Preliminary result)   Collection Time: 09/12/15  1:52 AM  Result Value Ref Range Status   Specimen Description BLOOD LEFT HAND  Final   Special Requests BOTTLES DRAWN AEROBIC AND ANAEROBIC  Final   Culture   Final    NO GROWTH 1 DAY Performed at [facility]    Report Status PENDING  Incomplete    Radiology Reports   Ct Abdomen Pelvis W Contrast  09/12/2015  CLINICAL DATA:  82 year old  female with abdominal pain. History of small-bowel obstruction. EXAM: CT ABDOMEN AND PELVIS WITH CONTRAST TECHNIQUE: Multidetector CT imaging of the abdomen and pelvis was performed using the standard protocol following bolus administration of intravenous contrast. CONTRAST:  80mL ISOVUE-300 IOPAMIDOL (ISOVUE-300) INJECTION 61% COMPARISON:  CT dated 08/26/2015 is FINDINGS: There is  a partially visualized small left pleural effusion. Patchy areas of airspace density at the lung bases as well as multiple ground-glass and nodular density at the left lung base are concerning for pneumonia. Clinical correlation is recommended. There is coronary vascular calcification. No intra-abdominal free air.  No free fluid. Cholecystectomy. Multiple small scattered hepatic hypodense lesions of measuring up to 9 mm are not well characterized but likely represent cysts or hemangioma. Cholecystectomy. The pancreas, and the adrenal glands appear unremarkable. Subcentimeter splenic hypodense lesion is not characterized but likely represents a cyst or hemangioma. There is no hydronephrosis on either side. The visualized ureters appear grossly unremarkable. The urinary bladder is partially distended. There is mild haziness of the bladder wall. Correlation with urinalysis recommended to exclude cystitis. The uterus is grossly unremarkable. There is mild thickened appearance of the distal esophagus which may represent esophagitis or esophageal irritation related to reflux. The stomach is distended with gastric contents, likely related to the reported chronically impaired gastric emptying. There is thickened appearance of the gastric antrum. There is mild haziness of the proximal duodenum concerning for duodenitis. There is postsurgical changes of bowel with duodenojejunal anastomosis. There is no evidence of small-bowel obstruction. There are scattered sigmoid and colonic diverticula without active inflammatory changes. There is postsurgical  changes of partial sigmoid resection with anastomotic suture. There is advanced aortoiliac atherosclerotic disease. The origins of the celiac axis, SMA, and IMA appear patent. No portal venous gas identified. There is no adenopathy. There is midline vertical anterior pelvic wall incisional scar. There is a 5 cm defect in the anterior peritoneal wall along the surgical incision with protrusion of a short segment of the colon. A small fat containing left para incisional hernia noted. There is diffuse stranding of the subcutaneous fat. No fluid collection or hematoma. There is osteopenia with multilevel degenerative changes of the spine. T11 compression deformity similar to prior study. No acute fracture. IMPRESSION: Small left pleural effusion with findings concerning for bibasilar pneumonia. Clinical correlation and follow-up recommended. Severe gastric distention likely related to chronic gastric dysmotility or partial gastric outlet obstruction. Clinical correlation is recommended. There has been interval increase in the gastric distension compared the prior CT. Mild haziness of the proximal duodenum concerning for duodenitis. No evidence of small-bowel obstruction. Esophagitis. Colonic diverticulosis without active inflammatory changes. Electronically Signed   By: Elgie Collard M.D.   On: 09/12/2015 00:33     Dg Abd Portable 1v  09/12/2015  CLINICAL DATA:  Nausea. EXAM: PORTABLE ABDOMEN - 1 VIEW COMPARISON:  CT scan from yesterday FINDINGS: There is a paucity of bowel gas which limits evaluation. No evidence of small bowel obstruction on this study. The stomach may still be distended with debris. Contrast is seen in the left renal collecting system from yesterday's CT scan. No other acute abnormalities. IMPRESSION: No small bowel obstruction identified. The stomach is not as well evaluated on this study but may still be distended with debris. Electronically Signed   By: Gerome Sam III M.D   On:  09/12/2015 07:07      Time Spent in minutes  30   Susa Raring K M.D on 09/14/2015 at 11:53 AM  Between 7am to 7pm - Pager - [phone]  After 7pm go to [email or web address] - password [facility]  [facility] -  Office  [phone]

## 2015-09-14 NOTE — Progress Notes (Signed)
Progress Note   Subjective  Shannon [name] is a 82 year old African-American female who was admitted to the hospital on 09/11/15 for repeat symptoms of abdominal distention, nausea, vomiting and anorexia. Patient did have an NG tube placed yesterday with removal of 2425 mL from 7 AM to 7 PM. Patient has had a further removal of around 300 mL overnight. Patient does report that she feels much better, but her abdomen is still "tight". Her daughter is by her bedside and does ask what the "plans are going forward". The patient does tell me that she had a small liquid "black" bowel movement overnight. She denies any other new symptoms.   Objective   Vital signs in last 24 hours: Temp:  [98.4 F (36.9 C)-98.8 F (37.1 C)] 98.4 F (36.9 C) (06/13 0427) Pulse Rate:  [75-79] 76 (06/13 0427) Resp:  [18-20] 18 (06/13 0427) BP: (130-155)/(72-74) 149/73 mmHg (06/13 0427) SpO2:  [93 %-100 %] 94 % (06/13 0842) Last BM Date: 09/12/15 General: Pleasant African American female in NAD Heart:  Regular rate and rhythm; no murmurs Lungs: Respirations even and unlabored, lungs CTA bilaterally Abdomen:  Tense, mild distension (improved from yesterday)  Normal bowel sounds. Extremities:  Without edema. Neurologic:  Alert and oriented,  grossly normal neurologically. Psych:  Cooperative. Normal mood and affect.  Intake/Output from previous day: 06/12 0701 - 06/13 0700 In: 2326.3 [I.V.:1526.3; IV Piggyback:800] Out: 2625 [Urine:200; Emesis/NG output:2425]  Lab Results:  Recent Labs  09/11/15 2351 09/12/15 0040 09/13/15 0358 09/14/15 0435  WBC 18.7*  --  16.5* 15.7*  HGB 10.8* 11.9* 8.7* 9.4*  HCT 31.1* 35.0* 25.4* 27.7*  PLT 335  --  273 306   BMET  Recent Labs  09/11/15 2351 09/12/15 0040 09/13/15 0358 09/14/15 0435  NA 139 142 142 141  K 5.3* 5.0 3.1* 3.6  CL 105 104 110 112*  CO2 25  --  25 21*  GLUCOSE 115* 108* 82 56*  BUN 19 25* 16 13  CREATININE 1.52* 1.50* 1.38* 1.22*  CALCIUM  9.7  --  8.4* 8.5*   LFT  Recent Labs  09/11/15 2351  PROT 7.6  ALBUMIN 3.7  AST 43*  ALT 24  ALKPHOS 59  BILITOT 1.2   PT/INR  Recent Labs  09/13/15 0848  LABPROT 17.0*  INR 1.37    Studies/Results: No results found.     Assessment / Plan:   Impression: 1. Nausea and vomiting in patient with history of the same: Complicated patient with multiple abdominal surgeries, history of small bowel obstructions, pyloric stenosis, functional gastric outlet obstruction; Candida esophagitis on latest EGD on 07/08/2015; recent admission with improvement of symptoms after NG tube-patient returned hospital 4 days after last discharge with continued symptoms; CT abdomen and pelvis at time of admission showed small left pleural effusion with findings concerning for bibasilar pneumonia, severe gastric distention likely related to chronic gastric dysmotility or partial gastric outlet obstruction, mild haziness of the proximal duodenum concerning for duodenitis with no evidence of small bowel obstruction, esophagitis and colonic diverticulosis without active inflammatory changes-consider repeat GOO possibly caused by duodenitis seen on recent CT?; Currently after NG tube placement, removal of 2725 CC total over the past 24 hours, patient has decrease of symptoms though abdomen is still tense and distended. Consider bezoar versus structural obstruction. 2. Constipation: Controlled on twice a day MiraLAX at home, per daughter this causes her to be nauseous and having a lot of gas.  Plan: 1. Continue NG tube to  wall with low intermittent suction 2. Discussed with the family that we may need to do an EGD for further evaluation of possible bezoar causing gastric outlet obstruction. They agreed to proceed if needed. They do not want the patient to go home and have repeat symptoms "again". 3. Patient to remain nothing by mouth 4. Continue supportive measures 5. Will discuss above with Dr. Havery Moros,  please await any further recommendations  Thank you for your kind consultation, we will continue to follow.  Principal Problem:   HCAP (healthcare-associated pneumonia) Active Problems:   Essential hypertension, benign   DM (diabetes mellitus) type II uncontrolled, periph vascular disorder (HCC)   Hypothyroidism   Depression   Anemia of chronic disease   Gastric distention   Acute renal failure superimposed on stage 3 chronic kidney disease (HCC)   Atrial fibrillation (HCC)   N&V (nausea and vomiting)   Diabetic osteomyelitis (HCC)   PVD (peripheral vascular disease) (HCC)   Hyperkalemia   Osteomyelitis of ankle or foot ([place])     LOS: 2 days   [name]  09/14/2015, 10:40 AM

## 2015-09-14 NOTE — Plan of Care (Signed)
Problem: Nutrition: Goal: Adequate nutrition will be maintained Outcome: Not Progressing Pt is NPO due to NG tube, and procedure tomorrow.

## 2015-09-14 NOTE — Consult Note (Signed)
[facility] CM Inpatient Consult   09/14/2015  Benny Slota 24-May-1923 [id number]   Patient screened for [facility] Care Management services. Went to bedside to discuss program. Patient asked that Clinical research associate contact her daughter. Ms. Rainford currently has a NG tube in place. Will follow back up at a later time. Made inpatient RNCM aware.   Raiford Noble, MSN-Ed, RN,BSN [facility] Liaison [phone]

## 2015-09-15 ENCOUNTER — Inpatient Hospital Stay (HOSPITAL_COMMUNITY): Payer: Medicare Other | Admitting: Certified Registered Nurse Anesthetist

## 2015-09-15 ENCOUNTER — Encounter (HOSPITAL_COMMUNITY)
Admission: EM | Disposition: A | Discharge status: Home Health | Payer: Self-pay | Source: Home / Self Care | Attending: Internal Medicine

## 2015-09-15 ENCOUNTER — Encounter (HOSPITAL_COMMUNITY): Payer: Self-pay

## 2015-09-15 ENCOUNTER — Other Ambulatory Visit: Payer: Medicare Other

## 2015-09-15 DIAGNOSIS — L8990 Pressure ulcer of unspecified site, unspecified stage: Secondary | ICD-10-CM | POA: Insufficient documentation

## 2015-09-15 HISTORY — PX: ESOPHAGOGASTRODUODENOSCOPY (EGD) WITH PROPOFOL: SHX5813

## 2015-09-15 LAB — BASIC METABOLIC PANEL
Anion gap: 11 (ref 5–15)
BUN: 10 mg/dL (ref 6–20)
CO2: 17 mmol/L — ABNORMAL LOW (ref 22–32)
Calcium: 8.4 mg/dL — ABNORMAL LOW (ref 8.9–10.3)
Chloride: 112 mmol/L — ABNORMAL HIGH (ref 101–111)
Creatinine, Ser: 1.16 mg/dL — ABNORMAL HIGH (ref 0.44–1.00)
GFR calc Af Amer: 47 mL/min — ABNORMAL LOW (ref >60)
GFR calc non Af Amer: 40 mL/min — ABNORMAL LOW (ref >60)
Glucose, Bld: 60 mg/dL — ABNORMAL LOW (ref 65–99)
Potassium: 4 mmol/L (ref 3.5–5.1)
Sodium: 140 mmol/L (ref 135–145)

## 2015-09-15 LAB — CBC
HCT: 29.2 % — ABNORMAL LOW (ref 36.0–46.0)
Hemoglobin: 10 g/dL — ABNORMAL LOW (ref 12.0–15.0)
MCH: 32.4 pg (ref 26.0–34.0)
MCHC: 34.2 g/dL (ref 30.0–36.0)
MCV: 94.5 fL (ref 78.0–100.0)
Platelets: 318 10*3/uL (ref 150–400)
RBC: 3.09 MIL/uL — ABNORMAL LOW (ref 3.87–5.11)
RDW: 18.3 % — ABNORMAL HIGH (ref 11.5–15.5)
WBC: 14.4 10*3/uL — ABNORMAL HIGH (ref 4.0–10.5)

## 2015-09-15 LAB — GLUCOSE, CAPILLARY
Glucose-Capillary: 55 mg/dL — ABNORMAL LOW (ref 65–99)
Glucose-Capillary: 111 mg/dL — ABNORMAL HIGH (ref 65–99)
Glucose-Capillary: 117 mg/dL — ABNORMAL HIGH (ref 65–99)

## 2015-09-15 LAB — APTT: aPTT: 86 s — ABNORMAL HIGH (ref 24–37)

## 2015-09-15 LAB — HEPARIN LEVEL (UNFRACTIONATED): Heparin Unfractionated: 1.16 IU/mL — ABNORMAL HIGH (ref 0.30–0.70)

## 2015-09-15 SURGERY — ESOPHAGOGASTRODUODENOSCOPY (EGD) WITH PROPOFOL
Anesthesia: Monitor Anesthesia Care

## 2015-09-15 MED ORDER — DEXTROSE 50 % IV SOLN
50.0000 mL | Freq: Once | INTRAVENOUS | Status: AC
  Administered 2015-09-15: 50 mL via INTRAVENOUS
  Filled 2015-09-15: qty 50

## 2015-09-15 MED ORDER — SODIUM CHLORIDE 0.9 % IV SOLN
INTRAVENOUS | Status: DC
  Administered 2015-09-15 (×2): via INTRAVENOUS

## 2015-09-15 MED ORDER — LIDOCAINE HCL (CARDIAC) 20 MG/ML IV SOLN
INTRAVENOUS | Status: AC
  Filled 2015-09-15: qty 5

## 2015-09-15 MED ORDER — PHENOL 1.4 % MT LIQD
1.0000 | OROMUCOSAL | Status: DC | PRN
  Administered 2015-09-21 – 2015-09-22 (×2): 1 via OROMUCOSAL
  Filled 2015-09-15 (×2): qty 177

## 2015-09-15 MED ORDER — MAGNESIUM SULFATE 2 GM/50ML IV SOLN: 2.0000 g | Freq: Once | INTRAVENOUS | Status: DC

## 2015-09-15 MED ORDER — LIDOCAINE HCL (CARDIAC) 20 MG/ML IV SOLN
INTRAVENOUS | Status: DC | PRN
  Administered 2015-09-15: 50 mg via INTRAVENOUS

## 2015-09-15 MED ORDER — SODIUM CHLORIDE 0.9 % IV SOLN
1.5000 g | Freq: Four times a day (QID) | INTRAVENOUS | Status: DC
  Administered 2015-09-15 – 2015-09-20 (×19): 1.5 g via INTRAVENOUS
  Filled 2015-09-15 (×20): qty 1.5

## 2015-09-15 MED ORDER — FENTANYL CITRATE (PF) 100 MCG/2ML IJ SOLN
INTRAMUSCULAR | Status: AC
  Filled 2015-09-15: qty 2

## 2015-09-15 MED ORDER — PROPOFOL 10 MG/ML IV BOLUS
INTRAVENOUS | Status: DC | PRN
  Administered 2015-09-15 (×4): 20 mg via INTRAVENOUS

## 2015-09-15 MED ORDER — POTASSIUM CL IN DEXTROSE 5% 20 MEQ/L IV SOLN
20.0000 meq | INTRAVENOUS | Status: AC
  Administered 2015-09-15: 20 meq via INTRAVENOUS
  Filled 2015-09-15 (×2): qty 1000

## 2015-09-15 MED ORDER — ERYTHROMYCIN BASE 250 MG PO TABS
250.0000 mg | ORAL_TABLET | Freq: Three times a day (TID) | ORAL | Status: DC
  Administered 2015-09-15 – 2015-09-16 (×3): 250 mg via ORAL
  Filled 2015-09-15 (×7): qty 1

## 2015-09-15 MED ORDER — METOCLOPRAMIDE HCL 5 MG PO TABS
5.0000 mg | ORAL_TABLET | Freq: Four times a day (QID) | ORAL | Status: DC | PRN
Start: 2015-09-15 — End: 2015-09-18
  Filled 2015-09-15: qty 1

## 2015-09-15 MED ORDER — MAGNESIUM SULFATE IN D5W 1-5 GM/100ML-% IV SOLN
1.0000 g | Freq: Once | INTRAVENOUS | Status: AC
  Administered 2015-09-15: 1 g via INTRAVENOUS
  Filled 2015-09-15: qty 100

## 2015-09-15 MED ORDER — MIDAZOLAM HCL 5 MG/ML IJ SOLN
INTRAMUSCULAR | Status: AC
Start: 2015-09-15 — End: 2015-09-15
  Filled 2015-09-15: qty 2

## 2015-09-15 MED ORDER — PROPOFOL 10 MG/ML IV BOLUS
INTRAVENOUS | Status: AC
  Filled 2015-09-15: qty 20

## 2015-09-15 SURGICAL SUPPLY — 14 items
BLOCK BITE 60FR ADLT L/F BLUE (MISCELLANEOUS) ×2 IMPLANT
ELECT REM PT RETURN 9FT ADLT (ELECTROSURGICAL)
ELECTRODE REM PT RTRN 9FT ADLT (ELECTROSURGICAL) IMPLANT
FORCEP RJ3 GP 1.8X160 W-NEEDLE (CUTTING FORCEPS) IMPLANT
FORCEPS BIOP RAD 4 LRG CAP 4 (CUTTING FORCEPS) IMPLANT
NEEDLE SCLEROTHERAPY 25GX240 (NEEDLE) IMPLANT
PROBE APC STR FIRE (PROBE) IMPLANT
PROBE INJECTION GOLD (MISCELLANEOUS)
PROBE INJECTION GOLD 7FR (MISCELLANEOUS) IMPLANT
SNARE SHORT THROW 13M SML OVAL (MISCELLANEOUS) IMPLANT
SYR 50ML LL SCALE MARK (SYRINGE) IMPLANT
TUBING ENDO SMARTCAP PENTAX (MISCELLANEOUS) ×4 IMPLANT
TUBING IRRIGATION ENDOGATOR (MISCELLANEOUS) ×2 IMPLANT
WATER STERILE IRR 1000ML POUR (IV SOLUTION) IMPLANT

## 2015-09-15 NOTE — Progress Notes (Signed)
[place] for heparin Indication: atrial fibrillation  No Known Allergies  Patient Measurements: Height: 5\' 6"  (167.6 cm) Weight: 147 lb 1.6 oz (66.724 kg) IBW/kg (Calculated) : 59.3 Heparin Dosing Weight: 67kg  Vital Signs: Temp: 98.3 F (36.8 C) (06/14 0452) Temp Source: Oral (06/14 0452) BP: 166/94 mmHg (06/14 0452) Pulse Rate: 66 (06/14 0452)  Labs:  Recent Labs  09/13/15 0358 09/13/15 0848  09/14/15 0435 09/14/15 1540 09/14/15 2226 09/15/15 0456  HGB 8.7*  --   --  9.4*  --   --  10.0*  HCT 25.4*  --   --  27.7*  --   --  29.2*  PLT 273  --   --  306  --   --  318  APTT  --  33  < > 146* 81* 93* 86*  LABPROT  --  17.0*  --   --   --   --   --   INR  --  1.37  --   --   --   --   --   HEPARINUNFRC  --  1.12*  < > 0.88* 1.00* 1.22* 1.16*  CREATININE 1.38*  --   --  1.22*  --   --  1.16*  < > = values in this interval not displayed.  Estimated Creatinine Clearance: 30.2 mL/min (by C-G formula based on Cr of 1.16).  Assessment: 89 YOF with afib on apixaban 2.5mg  PO BID PTA. She presented to the ED on 6/11 with c/o abdominal pain and n/v. Abd/pelvis CT showed servere gastric distension with suspicion for chronic gastric dysmotility or partial gastric outlet obstruction.  Apixaban was started on admission, d/ced on  6/11( with last at 2309) and changed to heparin on 6/12 in anticipation for GI procedure.  Today, 09/15/2015  Hgb low but improving, plt wnl  heparin level = 1.16 (elevated as expected with eliquis therapy). Still not correlating with aPTT results  aPTT = 86 (therapeutic-- this is more reliable while patient still has residual apixaban in system)  GI suspects bezoar with plan to proceed with EGD to evaluate GOO, r/o obstruction on 6/14.  Has h/o bezoar  Goal of Therapy:  Heparin level 0.3-0.7 units/ml, aPTT 66-102 sec Monitor platelets by anticoagulation protocol: Yes   Plan:   Continue heparin drip at 650  units/hr  Daily CBC, heparin level (and aPTT until apixaban effects gone)  F/u resuming apixaban s/p procedure  Doreene Eland, PharmD, BCPS.   Pager: [phone] 09/15/2015 7:10 AM

## 2015-09-15 NOTE — H&P (View-Only) (Signed)
Progress Note   Subjective  Shannon [name] is a 82 year old African-American female who was admitted to the hospital on 09/11/15 for repeat symptoms of abdominal distention, nausea, vomiting and anorexia. Patient did have an NG tube placed yesterday with removal of 2425 mL from 7 AM to 7 PM. Patient has had a further removal of around 300 mL overnight. Patient does report that she feels much better, but her abdomen is still "tight". Her daughter is by her bedside and does ask what the "plans are going forward". The patient does tell me that she had a small liquid "black" bowel movement overnight. She denies any other new symptoms.   Objective   Vital signs in last 24 hours: Temp:  [98.4 F (36.9 C)-98.8 F (37.1 C)] 98.4 F (36.9 C) (06/13 0427) Pulse Rate:  [75-79] 76 (06/13 0427) Resp:  [18-20] 18 (06/13 0427) BP: (130-155)/(72-74) 149/73 mmHg (06/13 0427) SpO2:  [93 %-100 %] 94 % (06/13 0842) Last BM Date: 09/12/15 General: Pleasant African American female in NAD Heart:  Regular rate and rhythm; no murmurs Lungs: Respirations even and unlabored, lungs CTA bilaterally Abdomen:  Tense, mild distension (improved from yesterday)  Normal bowel sounds. Extremities:  Without edema. Neurologic:  Alert and oriented,  grossly normal neurologically. Psych:  Cooperative. Normal mood and affect.  Intake/Output from previous day: 06/12 0701 - 06/13 0700 In: 2326.3 [I.V.:1526.3; IV Piggyback:800] Out: 2625 [Urine:200; Emesis/NG output:2425]  Lab Results:  Recent Labs  09/11/15 2351 09/12/15 0040 09/13/15 0358 09/14/15 0435  WBC 18.7*  --  16.5* 15.7*  HGB 10.8* 11.9* 8.7* 9.4*  HCT 31.1* 35.0* 25.4* 27.7*  PLT 335  --  273 306   BMET  Recent Labs  09/11/15 2351 09/12/15 0040 09/13/15 0358 09/14/15 0435  NA 139 142 142 141  K 5.3* 5.0 3.1* 3.6  CL 105 104 110 112*  CO2 25  --  25 21*  GLUCOSE 115* 108* 82 56*  BUN 19 25* 16 13  CREATININE 1.52* 1.50* 1.38* 1.22*  CALCIUM  9.7  --  8.4* 8.5*   LFT  Recent Labs  09/11/15 2351  PROT 7.6  ALBUMIN 3.7  AST 43*  ALT 24  ALKPHOS 59  BILITOT 1.2   PT/INR  Recent Labs  09/13/15 0848  LABPROT 17.0*  INR 1.37    Studies/Results: No results found.     Assessment / Plan:   Impression: 1. Nausea and vomiting in patient with history of the same: Complicated patient with multiple abdominal surgeries, history of small bowel obstructions, pyloric stenosis, functional gastric outlet obstruction; Candida esophagitis on latest EGD on 07/08/2015; recent admission with improvement of symptoms after NG tube-patient returned hospital 4 days after last discharge with continued symptoms; CT abdomen and pelvis at time of admission showed small left pleural effusion with findings concerning for bibasilar pneumonia, severe gastric distention likely related to chronic gastric dysmotility or partial gastric outlet obstruction, mild haziness of the proximal duodenum concerning for duodenitis with no evidence of small bowel obstruction, esophagitis and colonic diverticulosis without active inflammatory changes-consider repeat GOO possibly caused by duodenitis seen on recent CT?; Currently after NG tube placement, removal of 2725 CC total over the past 24 hours, patient has decrease of symptoms though abdomen is still tense and distended. Consider bezoar versus structural obstruction. 2. Constipation: Controlled on twice a day MiraLAX at home, per daughter this causes her to be nauseous and having a lot of gas.  Plan: 1. Continue NG tube to  wall with low intermittent suction 2. Discussed with the family that we may need to do an EGD for further evaluation of possible bezoar causing gastric outlet obstruction. They agreed to proceed if needed. They do not want the patient to go home and have repeat symptoms "again". 3. Patient to remain nothing by mouth 4. Continue supportive measures 5. Will discuss above with Dr. Havery Moros,  please await any further recommendations  Thank you for your kind consultation, we will continue to follow.  Principal Problem:   HCAP (healthcare-associated pneumonia) Active Problems:   Essential hypertension, benign   DM (diabetes mellitus) type II uncontrolled, periph vascular disorder (HCC)   Hypothyroidism   Depression   Anemia of chronic disease   Gastric distention   Acute renal failure superimposed on stage 3 chronic kidney disease (HCC)   Atrial fibrillation (HCC)   N&V (nausea and vomiting)   Diabetic osteomyelitis (HCC)   PVD (peripheral vascular disease) (HCC)   Hyperkalemia   Osteomyelitis of ankle or foot ([place])     LOS: 2 days   [name]  09/14/2015, 10:40 AM

## 2015-09-15 NOTE — Progress Notes (Signed)
Hypoglycemic Event  CBG: 55  Treatment: D50 IV 50 mL  Symptoms: Sweaty  Follow-up CBG: Time: 0915 CBG Result: 111  Possible Reasons for Event: Inadequate meal intake  Comments/MD notified: MD Elmyra Ricks

## 2015-09-15 NOTE — Transfer of Care (Signed)
Immediate Anesthesia Transfer of Care Note  Patient: Shannon [name]  Procedure(s) Performed: Procedure(s): ESOPHAGOGASTRODUODENOSCOPY (EGD) WITH PROPOFOL (N/A)  Patient Location: PACU  Anesthesia Type:MAC  Level of Consciousness: awake, alert  and oriented  Airway & Oxygen Therapy: Patient Spontanous Breathing and Patient connected to face mask oxygen  Post-op Assessment: Report given to RN and Post -op Vital signs reviewed and stable  Post vital signs: Reviewed and stable  Last Vitals:  Filed Vitals:   09/15/15 1245 09/15/15 1424  BP: 154/74 174/69  Pulse: 72 68  Temp:  36.6 C  Resp:  16    Last Pain:  Filed Vitals:   09/15/15 1431  PainSc: 0-No pain         Complications: No apparent anesthesia complications

## 2015-09-15 NOTE — Interval H&P Note (Signed)
History and Physical Interval Note:  09/15/2015 3:50 PM  Shannon [name]  has presented today for surgery, with the diagnosis of GOO  The various methods of treatment have been discussed with the patient and family. After consideration of risks, benefits and other options for treatment, the patient has consented to  Procedure(s): ESOPHAGOGASTRODUODENOSCOPY (EGD) WITH PROPOFOL (N/A) as a surgical intervention .  The patient's history has been reviewed, patient examined, no change in status, stable for surgery.  I have reviewed the patient's chart and labs.  Questions were answered to the patient's satisfaction.     Renelda Loma Randalyn Ahmed

## 2015-09-15 NOTE — Anesthesia Preprocedure Evaluation (Addendum)
Anesthesia Evaluation  Patient identified by MRN, date of birth, ID band  Reviewed: Patient's Chart, lab work & pertinent test results  Airway Mallampati: II   Neck ROM: Full    Dental  (+) Edentulous Upper   Pulmonary shortness of breath, asthma , COPD,    breath sounds clear to auscultation       Cardiovascular hypertension, + Peripheral Vascular Disease   Rhythm:Regular Rate:Normal     Neuro/Psych CVA    GI/Hepatic PUD, GERD  ,  Endo/Other  diabetesHypothyroidism   Renal/GU Renal disease     Musculoskeletal  (+) Arthritis ,   Abdominal   Peds  Hematology  (+) anemia ,   Anesthesia Other Findings   Reproductive/Obstetrics                            Anesthesia Physical Anesthesia Plan  ASA: IV  Anesthesia Plan: MAC   Post-op Pain Management:    Induction: Intravenous  Airway Management Planned: Natural Airway and Nasal Cannula  Additional Equipment:   Intra-op Plan:   Post-operative Plan:   Informed Consent: I have reviewed the patients History and Physical, chart, labs and discussed the procedure including the risks, benefits and alternatives for the proposed anesthesia with the patient or authorized representative who has indicated his/her understanding and acceptance.   Dental advisory given  Plan Discussed with: CRNA and Surgeon  Anesthesia Plan Comments:         Anesthesia Quick Evaluation

## 2015-09-15 NOTE — Progress Notes (Signed)
Pharmacy Antibiotic Note  Shannon [name] is a 82 y.o. female admitted on 09/11/2015 with pneumonia, osteomyelitis of right 3rd toe.  Pharmacy was consulted for Vancomycin, cefepime dosing.  Antibiotics changed to ampicillin/sulbactam 6/14.  Day #4 antibiotics - Renal: SCr improving.  CrCl calculated at 52ml/min - WBC elevated but improving - afebrile  Plan:  Unasyn 1.5gm IV q6h - to begin 24h from last cefepime dose  Follow renal function  Height: 5\' 6"  (167.6 cm) Weight: 147 lb 1.6 oz (66.724 kg) IBW/kg (Calculated) : 59.3  Temp (24hrs), Avg:98.4 F (36.9 C), Min:98.2 F (36.8 C), Max:98.6 F (37 C)   Recent Labs Lab 09/10/15 1213 09/11/15 2351 09/12/15 0039 09/12/15 0040 09/13/15 0358 09/14/15 0435 09/15/15 0456  WBC 19.1* 18.7*  --   --  16.5* 15.7* 14.4*  CREATININE 1.64* 1.52*  --  1.50* 1.38* 1.22* 1.16*  LATICACIDVEN  --   --  1.52  --   --   --   --     Estimated Creatinine Clearance: 30.2 mL/min (by C-G formula based on Cr of 1.16).    No Known Allergies  Antimicrobials this admission: Vancomycin 6/11 >> 6/14 Cefepime 6/11 >> 6/14 Amp/sulb 6/14 >>  Dose adjustments this admission: (previous admission, 6/1 VT = 14 on vanc 750 q24h with SCr 1.4-1.6 range)  Microbiology results: 6/11 BCx: NGTD 6/11 UCx: 8K insignificant growth 6/13 MRSA PCR: neg (5/27 Cdiff neg)   Thank you for allowing pharmacy to be a part of this patient's care.  Doreene Eland, PharmD, BCPS.   Pager: [phone] 09/15/2015 10:58 AM

## 2015-09-15 NOTE — Op Note (Signed)
[facility] Patient Name: Shannon [name] Procedure Date: 09/15/2015 MRN: [id number] Attending MD: Willaim Rayas. Adela Lank , MD Date of Birth: 1923-08-13 CSN: [id number] Age: 82 Admit Type: Inpatient Procedure:                Upper GI endoscopy Indications:              For therapy of post-surgical anastomotic stenosis,                            Persistent vomiting Providers:                Viviann Spare P. Adela Lank, MD, Priscella Mann, RN,                            Oletha Blend, Technician, [facility],                            CRNA Referring MD:              Medicines:                Monitored Anesthesia Care Complications:            No immediate complications. Estimated blood loss:                            Minimal. Estimated Blood Loss:     Estimated blood loss was minimal. Procedure:                Pre-Anesthesia Assessment:                           - Prior to the procedure, a History and Physical                            was performed, and patient medications and                            allergies were reviewed. The patient's tolerance of                            previous anesthesia was also reviewed. The risks                            and benefits of the procedure and the sedation                            options and risks were discussed with the patient.                            All questions were answered, and informed consent                            was obtained. Prior Anticoagulants: The patient has                            taken heparin, last dose was day  of procedure. ASA                            Grade Assessment: III - A patient with severe                            systemic disease. After reviewing the risks and                            benefits, the patient was deemed in satisfactory                            condition to undergo the procedure.                           After obtaining informed consent, the endoscope was                             passed under direct vision. Throughout the                            procedure, the patient's blood pressure, pulse, and                            oxygen saturations were monitored continuously. The                            [id number]) scope was introduced through the                            mouth, and advanced to the second part of duodenum.                            The upper GI endoscopy was accomplished without                            difficulty. The patient tolerated the procedure                            well. Scope In: Scope Out: Findings:      Esophagogastric landmarks were identified: the Z-line was found at 40       cm, the gastroesophageal junction was found at 40 cm and the upper       extent of the gastric folds was found at 40 cm from the incisors.      Mucosal changes (patchy erythema) were found in the entire esophagus       consistent with NG tube trauma. No stigmata of bleeding was noted.      The exam of the esophagus was otherwise normal.      A large amount of retained thick fluid / small food fragments, was found       in the gastric body. Roughly 500cc was suctioned. No bezoar or hard       collections of gastric contents were noted. A roth net was passed       through multiple  times and the collection was mostly thick fluids.      Evidence of a previous surgical anastomosis was found in the pylorus.       This was characterized by moderate stenosis with a very angulated turn,       passed with mild to moderate resistance with the endoscope. A TTS       dilator was passed through the scope. Dilation with a 15 mm pyloric       balloon dilator was performed with a small amount of heme noted.      The exam of the stomach was otherwise normal.      The examined small bowel was normal. Impression:               - Esophagogastric landmarks identified.                           - Mucosal changes in the esophagus consistent  with                            NG tube trauma                           - Retained gastric fluids / small food fragments -                            no bezoar                           - A previous surgical anastomosis was found,                            characterized by moderate stenosis. Dilated to 15mm.                           - Normal examined small bowel                           - No specimens collected.                           Overall, patient does have a pyloric stenosis                            although could be due to a severely angulated turn                            and more of a functional outlet obstruction. I                            elected to dilate to 15mm with good result. Mostly                            retained liquid contents, not at site of the                            outlet, also suggests component of  gastroparesis to                            this picture. 500cc of additional fluid was                            suctioned, some residual thick fluids and small                            food fragments remain. Moderate Sedation:      No moderate sedation, case performed with MAC Recommendation:           - Return patient to hospital ward for ongoing care.                           - Trial of clear liquid diet.                           - Continue present medications, hold                            anticoagulation today in light of dilation                           - Start Reglan 5mg  q 6 hours                           - Trial of IV erythromycin to stimulate gastric                            motility / emptying                           - GI service will continue to follow the patient Procedure Code(s):        --- Professional ---                           [phone], Esophagogastroduodenoscopy, flexible,                            transoral; with dilation of gastric/duodenal                            stricture(s) (eg, balloon, bougie) Diagnosis  Code(s):        --- Professional ---                           K22.8, Other specified diseases of esophagus                           Z98.0, Intestinal bypass and anastomosis status                           K91.89, Other postprocedural complications and  disorders of digestive system                           R11.10, Vomiting, unspecified CPT copyright 2016 [employer]. All rights reserved. The codes documented in this report are preliminary and upon coder review may  be revised to meet current compliance requirements. Viviann Spare P. Armbruster, MD 09/15/2015 4:53:20 PM This report has been signed electronically. Number of Addenda: 0

## 2015-09-15 NOTE — Progress Notes (Signed)
PROGRESS NOTE                                                                                                                                                                                                             Patient Demographics:    Shannon [name], is a 82 y.o. female, DOB - 1924-01-25, HKV:[id number]  Admit date - 09/11/2015   Admitting Physician Briscoe Deutscher, MD  Outpatient Primary MD for the patient is Kirt Boys, DO  LOS - 3  Chief Complaint  Patient presents with  . Abdominal Pain       Brief Narrative    Shannon [name] is a medically complex 82 y.o. female with medical history significant for hypertension, hyperlipidemia, hypothyroidism, peripheral arterial disease with ischemic toe complicated by osteomyelitis, and chronic gastric distention with recurrent nausea and vomiting who presents to the ED for evaluation of abdominal pain with nausea and vomiting of 2 days' duration. Patient had a recent admission, just discharged on 09/05/2015 following management of abdominal pain with nausea and vomiting. She had an NG tube placed at that time and was started on liquid diet, essentially improving spontaneously and tolerating full diet. She was also noted to have an ischemic third toe on the right foot secondary to PAD and workup revealed underlying osteomyelitis. Surgery was advised for management of this, however patient declined. She was discharged with a one-month course of Augmentin. She had initially done well back at home before the insidious development of recurrent abdominal pain with nausea and vomiting. She reports the vomitus as nonbloody and nonbilious. There's been no diarrhea; in fact she has not had a bowel movement in 2-3 days. She denies chest pain or palpitations, but does endorse dyspnea with minimal exertion. She denies any significant cough.   Further workup confirmed a reoccurrence of her gastric  outlet obstruction due to pyloric stenosis, she has had multiple GI evaluations in the past for the same. She received NG tube placement for gastric decompression, since she was on Eliquis it was held, she was transitioned to heparin bridge, GI following and to do EGD on 09/15/2015. Her pneumonia has clinically resolved, if she tolerates diet likely to be discharged on 09/16/2015.    Subjective:    Shannon [name] today has, No headache, No chest pain, No abdominal pain - No Nausea, No new  weakness tingling or numbness, No Cough - SOB.  She overall does not want to have any major surgeries. She is okay for EGD.   Assessment  & Plan :     1. HCAP - likely aspiration, from gastric outlet obstruction, appears nontoxic currently afebrile, Was treated with vancomycin and cefepime, cultures negative will taper down to Unasyn.  2. Gastric outlet obstruction due to pyloric stenosis. Much better after NG tube for gastric decompression, since she was on liquids it is held and she is on heparin bridge for A. fib, GI on board to for EGD on 09/15/2015. Continue bowel rest with IV fluids for now.  3. Right third toe gangrene. On antibiotics, refused amputation last admission, again refuses, confirmed with her daughter. Suppressive antibiotic course ongoing for 30 days. After antibiotic course she wants to continue with wound care and supportive care. If worst daughter requested to consider comfort care.  4. Dyslipidemia. On statin.  5. Depression. On Zoloft.  6. Hypothyroidism recently Synthroid increased to 88 g few days ago, repeat TSH in 4 weeks from now.  7. GERD. On PPI  8. Paroxysmal atrial fibrillation with RVR [place] vasc 2 score of 5. Continue amiodarone and Coreg. Hold Eliquis bridge her with heparin as she to go EGD with possible pyloric dilation on 09/15/2015, last Eliquis does 09/12/2015 evening.  9. History of asthma COPD. Stable.  10. Hypokalemia. Replaced.  11. Mild normal anion gap  metabolic acidosis due to hyperchloremia. Switch to D5W. Stop normal saline.   Code Status :  Partial with no CPR or intubation  Family Communication  :  Discussed with daughter in detail everyday bedside including 09/15/2015   Disposition Plan  :  Stay inpatient for now, daughter told most likely discharge on 09/16/2015 if tolerating diet  Consults  :  GI  Procedures  :    CT chest. Possible pneumonia, gastric distention.  EGD due on 09/15/2015  DVT Prophylaxis  :  Eliquis/Heparin  Lab Results  Component Value Date   PLT 318 09/15/2015    Inpatient Medications  Scheduled Meds: . amiodarone  100 mg Oral Daily  . atorvastatin  10 mg Oral q1800  . carvedilol  3.125 mg Oral BID WC  . ceFEPime (MAXIPIME) IV  1 g Intravenous QHS  . chlorhexidine  15 mL Mouth/Throat BID  . cycloSPORINE  1 drop Both Eyes BID  . famotidine (PEPCID) IV  20 mg Intravenous Q12H  . fluticasone  2 spray Each Nare Daily  . gabapentin  300 mg Oral Daily  . gabapentin  600 mg Oral 2 times per day  . latanoprost  1 drop Both Eyes QHS  . levothyroxine  50 mcg Intravenous Daily  . mometasone-formoterol  2 puff Inhalation BID  . nitroGLYCERIN  0.5 inch Topical Q6H  . polyvinyl alcohol  1 drop Both Eyes QID  . sertraline  100 mg Oral Daily  . tamsulosin  0.4 mg Oral Daily  . vancomycin  750 mg Intravenous Q24H   Continuous Infusions: . dextrose 5 % with KCl 20 mEq / L 20 mEq (09/15/15 0825)  . heparin 650 Units/hr (09/14/15 1921)   PRN Meds:.acetaminophen, albuterol, bisacodyl, diclofenac sodium, hydrALAZINE, metoprolol, ondansetron (ZOFRAN) IV  Antibiotics  :    Anti-infectives    Start     Dose/Rate Route Frequency Ordered Stop   09/13/15 0600  vancomycin (VANCOCIN) IVPB 750 mg/150 ml premix     750 mg 150 mL/hr over 60 Minutes Intravenous Every 24 hours  09/12/15 0546     09/12/15 2200  ceFEPIme (MAXIPIME) 1 g in dextrose 5 % 50 mL IVPB     1 g 100 mL/hr over 30 Minutes Intravenous Daily at  bedtime 09/12/15 0410 09/20/15 2159   09/12/15 0430  ceFEPIme (MAXIPIME) 1 g in dextrose 5 % 50 mL IVPB     1 g 100 mL/hr over 30 Minutes Intravenous  Once 09/12/15 0427 09/12/15 0540   09/12/15 0100  vancomycin (VANCOCIN) IVPB 1000 mg/200 mL premix     1,000 mg 200 mL/hr over 60 Minutes Intravenous  Once 09/12/15 0046 09/12/15 0400   09/12/15 0100  piperacillin-tazobactam (ZOSYN) IVPB 3.375 g     3.375 g 100 mL/hr over 30 Minutes Intravenous  Once 09/12/15 0046 09/12/15 0222         Objective:   Filed Vitals:   09/14/15 0842 09/14/15 1400 09/14/15 2110 09/15/15 0452  BP:  145/57 130/64 166/94  Pulse:  70 66 66  Temp:  98.2 F (36.8 C) 98.6 F (37 C) 98.3 F (36.8 C)  TempSrc:  Oral Oral Oral  Resp:   18 18  Height:      Weight:      SpO2: 94% 99% 93% 100%    Wt Readings from Last 3 Encounters:  09/12/15 66.724 kg (147 lb 1.6 oz)  09/07/15 68.493 kg (151 lb)  08/26/15 68.357 kg (150 lb 11.2 oz)     Intake/Output Summary (Last 24 hours) at 09/15/15 1034 Last data filed at 09/15/15 1000  Gross per 24 hour  Intake 1947.5 ml  Output    125 ml  Net 1822.5 ml     Physical Exam  Awake Alert, Oriented X 3, No new F.N deficits, Normal affect [place].AT,PERRAL Supple Neck,No JVD, No cervical lymphadenopathy appriciated.  Symmetrical Chest wall movement, Good air movement bilaterally, CTAB RRR,No Gallops,Rubs or new Murmurs, No Parasternal Heave +ve B.Sounds, Abd distended, Mild epigastric tenderness, No organomegaly appriciated, No rebound - guarding or rigidity. No Cyanosis, Clubbing or edema, No new Rash or bruise, Right 3rd toe is black and atrophied    Data Review:    CBC  Recent Labs Lab 09/10/15 1213 09/11/15 2351 09/12/15 0040 09/13/15 0358 09/14/15 0435 09/15/15 0456  WBC 19.1* 18.7*  --  16.5* 15.7* 14.4*  HGB  --  10.8* 11.9* 8.7* 9.4* 10.0*  HCT 32.5* 31.1* 35.0* 25.4* 27.7* 29.2*  PLT 337 335  --  273 306 318  MCV 99* 96.6  --  94.8 95.2 94.5   MCH 32.4 33.5  --  32.5 32.3 32.4  MCHC 32.6 34.7  --  34.3 33.9 34.2  RDW 18.1* 18.2*  --  18.1* 18.2* 18.3*  LYMPHSABS 1.3 1.1  --   --   --   --   MONOABS  --  0.7  --   --   --   --   EOSABS 9.3* 8.2*  --   --   --   --   BASOSABS 0.1 0.0  --   --   --   --     Chemistries   Recent Labs Lab 09/10/15 1213 09/11/15 2351 09/12/15 0040 09/13/15 0358 09/14/15 0435 09/15/15 0456  NA 145* 139 142 142 141 140  K 4.0 5.3* 5.0 3.1* 3.6 4.0  CL 102 105 104 110 112* 112*  CO2 22 25  --  25 21* 17*  GLUCOSE 111* 115* 108* 82 56* 60*  BUN 17 19 25* 16 13 10  CREATININE 1.64* 1.52* 1.50* 1.38* 1.22* 1.16*  CALCIUM 9.8 9.7  --  8.4* 8.5* 8.4*  MG  --   --   --   --  1.6*  --   AST  --  43*  --   --   --   --   ALT  --  24  --   --   --   --   ALKPHOS  --  59  --   --   --   --   BILITOT  --  1.2  --   --   --   --    ------------------------------------------------------------------------------------------------------------------ No results for input(s): CHOL, HDL, LDLCALC, TRIG, CHOLHDL, LDLDIRECT in the last 72 hours.  Lab Results  Component Value Date   HGBA1C 6.5* 08/28/2015   ------------------------------------------------------------------------------------------------------------------ No results for input(s): TSH, T4TOTAL, T3FREE, THYROIDAB in the last 72 hours.  Invalid input(s): FREET3 ------------------------------------------------------------------------------------------------------------------ No results for input(s): VITAMINB12, FOLATE, FERRITIN, TIBC, IRON, RETICCTPCT in the last 72 hours.  Coagulation profile  Recent Labs Lab 09/13/15 0848  INR 1.37    No results for input(s): DDIMER in the last 72 hours.  Cardiac Enzymes No results for input(s): CKMB, TROPONINI, MYOGLOBIN in the last 168 hours.  Invalid input(s): CK ------------------------------------------------------------------------------------------------------------------    Component  Value Date/Time   BNP 39.0 08/26/2015 0630    Micro Results Recent Results (from the past 240 hour(s))  Urine culture     Status: Abnormal   Collection Time: 09/12/15 12:36 AM  Result Value Ref Range Status   Specimen Description URINE, CLEAN CATCH  Final   Special Requests NONE  Final   Culture (A)  Final    8,000 COLONIES/mL INSIGNIFICANT GROWTH Performed at [facility]    Report Status 09/13/2015 FINAL  Final  Culture, blood (Routine X 2) w Reflex to ID Panel     Status: None (Preliminary result)   Collection Time: 09/12/15  1:23 AM  Result Value Ref Range Status   Specimen Description BLOOD RIGHT ANTECUBITAL  Final   Special Requests BOTTLES DRAWN AEROBIC AND ANAEROBIC 5CC  Final   Culture   Final    NO GROWTH 2 DAYS Performed at [facility]    Report Status PENDING  Incomplete  Culture, blood (Routine X 2) w Reflex to ID Panel     Status: None (Preliminary result)   Collection Time: 09/12/15  1:52 AM  Result Value Ref Range Status   Specimen Description BLOOD LEFT HAND  Final   Special Requests BOTTLES DRAWN AEROBIC AND ANAEROBIC  Final   Culture   Final    NO GROWTH 2 DAYS Performed at [facility]    Report Status PENDING  Incomplete  MRSA PCR Screening     Status: None   Collection Time: 09/14/15  3:25 PM  Result Value Ref Range Status   MRSA by PCR NEGATIVE NEGATIVE Final    Comment:        The GeneXpert MRSA Assay (FDA approved for NASAL specimens only), is one component of a comprehensive MRSA colonization surveillance program. It is not intended to diagnose MRSA infection nor to guide or monitor treatment for MRSA infections.     Radiology Reports   Ct Abdomen Pelvis W Contrast  09/12/2015  CLINICAL DATA:  82 year old female with abdominal pain. History of small-bowel obstruction. EXAM: CT ABDOMEN AND PELVIS WITH CONTRAST TECHNIQUE: Multidetector CT imaging of the abdomen and pelvis was performed using the standard  protocol following  bolus administration of intravenous contrast. CONTRAST:  80mL ISOVUE-300 IOPAMIDOL (ISOVUE-300) INJECTION 61% COMPARISON:  CT dated 08/26/2015 is FINDINGS: There is a partially visualized small left pleural effusion. Patchy areas of airspace density at the lung bases as well as multiple ground-glass and nodular density at the left lung base are concerning for pneumonia. Clinical correlation is recommended. There is coronary vascular calcification. No intra-abdominal free air.  No free fluid. Cholecystectomy. Multiple small scattered hepatic hypodense lesions of measuring up to 9 mm are not well characterized but likely represent cysts or hemangioma. Cholecystectomy. The pancreas, and the adrenal glands appear unremarkable. Subcentimeter splenic hypodense lesion is not characterized but likely represents a cyst or hemangioma. There is no hydronephrosis on either side. The visualized ureters appear grossly unremarkable. The urinary bladder is partially distended. There is mild haziness of the bladder wall. Correlation with urinalysis recommended to exclude cystitis. The uterus is grossly unremarkable. There is mild thickened appearance of the distal esophagus which may represent esophagitis or esophageal irritation related to reflux. The stomach is distended with gastric contents, likely related to the reported chronically impaired gastric emptying. There is thickened appearance of the gastric antrum. There is mild haziness of the proximal duodenum concerning for duodenitis. There is postsurgical changes of bowel with duodenojejunal anastomosis. There is no evidence of small-bowel obstruction. There are scattered sigmoid and colonic diverticula without active inflammatory changes. There is postsurgical changes of partial sigmoid resection with anastomotic suture. There is advanced aortoiliac atherosclerotic disease. The origins of the celiac axis, SMA, and IMA appear patent. No portal venous gas  identified. There is no adenopathy. There is midline vertical anterior pelvic wall incisional scar. There is a 5 cm defect in the anterior peritoneal wall along the surgical incision with protrusion of a short segment of the colon. A small fat containing left para incisional hernia noted. There is diffuse stranding of the subcutaneous fat. No fluid collection or hematoma. There is osteopenia with multilevel degenerative changes of the spine. T11 compression deformity similar to prior study. No acute fracture. IMPRESSION: Small left pleural effusion with findings concerning for bibasilar pneumonia. Clinical correlation and follow-up recommended. Severe gastric distention likely related to chronic gastric dysmotility or partial gastric outlet obstruction. Clinical correlation is recommended. There has been interval increase in the gastric distension compared the prior CT. Mild haziness of the proximal duodenum concerning for duodenitis. No evidence of small-bowel obstruction. Esophagitis. Colonic diverticulosis without active inflammatory changes. Electronically Signed   By: Elgie Collard M.D.   On: 09/12/2015 00:33     Dg Abd Portable 1v  09/12/2015  CLINICAL DATA:  Nausea. EXAM: PORTABLE ABDOMEN - 1 VIEW COMPARISON:  CT scan from yesterday FINDINGS: There is a paucity of bowel gas which limits evaluation. No evidence of small bowel obstruction on this study. The stomach may still be distended with debris. Contrast is seen in the left renal collecting system from yesterday's CT scan. No other acute abnormalities. IMPRESSION: No small bowel obstruction identified. The stomach is not as well evaluated on this study but may still be distended with debris. Electronically Signed   By: Gerome Sam III M.D   On: 09/12/2015 07:07      Time Spent in minutes  30   Leroy Sea M.D on 09/15/2015 at 10:34 AM  Between 7am to 7pm - Pager - [phone]  After 7pm go to [email or web address] - password  [facility]  [facility] -  Office  [phone]

## 2015-09-16 LAB — CBC
HCT: 25.4 % — ABNORMAL LOW (ref 36.0–46.0)
Hemoglobin: 8.8 g/dL — ABNORMAL LOW (ref 12.0–15.0)
MCH: 33 pg (ref 26.0–34.0)
MCHC: 34.6 g/dL (ref 30.0–36.0)
MCV: 95.1 fL (ref 78.0–100.0)
Platelets: 281 10*3/uL (ref 150–400)
RBC: 2.67 MIL/uL — ABNORMAL LOW (ref 3.87–5.11)
RDW: 17.9 % — ABNORMAL HIGH (ref 11.5–15.5)
WBC: 10.6 10*3/uL — ABNORMAL HIGH (ref 4.0–10.5)

## 2015-09-16 LAB — APTT: aPTT: 37 seconds (ref 24–37)

## 2015-09-16 LAB — BASIC METABOLIC PANEL
Anion gap: 5 (ref 5–15)
BUN: 7 mg/dL (ref 6–20)
CO2: 22 mmol/L (ref 22–32)
Calcium: 8 mg/dL — ABNORMAL LOW (ref 8.9–10.3)
Chloride: 110 mmol/L (ref 101–111)
Creatinine, Ser: 1.01 mg/dL — ABNORMAL HIGH (ref 0.44–1.00)
GFR calc Af Amer: 55 mL/min — ABNORMAL LOW (ref >60)
GFR calc non Af Amer: 48 mL/min — ABNORMAL LOW (ref >60)
Glucose, Bld: 83 mg/dL (ref 65–99)
Potassium: 3.5 mmol/L (ref 3.5–5.1)
Sodium: 137 mmol/L (ref 135–145)

## 2015-09-16 LAB — HEPARIN LEVEL (UNFRACTIONATED): Heparin Unfractionated: 0.73 IU/mL — ABNORMAL HIGH (ref 0.30–0.70)

## 2015-09-16 LAB — PHOSPHORUS: Phosphorus: 1.8 mg/dL — ABNORMAL LOW (ref 2.5–4.6)

## 2015-09-16 LAB — MAGNESIUM: Magnesium: 1.7 mg/dL (ref 1.7–2.4)

## 2015-09-16 MED ORDER — POTASSIUM PHOSPHATE MONOBASIC 500 MG PO TABS
500.0000 mg | ORAL_TABLET | Freq: Three times a day (TID) | ORAL | Status: DC
  Administered 2015-09-16 – 2015-09-17 (×4): 500 mg via ORAL
  Filled 2015-09-16 (×8): qty 1

## 2015-09-16 MED ORDER — SODIUM CHLORIDE 0.9 % IV SOLN
250.0000 mg | Freq: Four times a day (QID) | INTRAVENOUS | Status: DC
  Administered 2015-09-16 – 2015-09-17 (×4): 250 mg via INTRAVENOUS
  Filled 2015-09-16 (×5): qty 5

## 2015-09-16 MED ORDER — APIXABAN 2.5 MG PO TABS
2.5000 mg | ORAL_TABLET | Freq: Two times a day (BID) | ORAL | Status: DC
  Administered 2015-09-16 – 2015-09-18 (×4): 2.5 mg via ORAL
  Filled 2015-09-16 (×4): qty 1

## 2015-09-16 NOTE — Care Management Important Message (Signed)
Important Message  Patient Details  Name: Jessilyn Lorenson MRN: [id number] Date of Birth: Apr 04, 1923   Medicare Important Message Given:  Yes    Camillo Flaming 09/16/2015, 11:28 AMImportant Message  Patient Details  Name: Carly Roady MRN: [id number] Date of Birth: 1923/08/07   Medicare Important Message Given:  Yes    Camillo Flaming 09/16/2015, 11:28 AM

## 2015-09-16 NOTE — Progress Notes (Signed)
[facility] Progress Note  Patient: Shannon [name] [id number]   PCP: Kirt Boys, DO DOB: 1923-08-20   DOA: 09/11/2015   DOS: 09/16/2015   Date of Service: the patient was seen and examined on 09/16/2015  Subjective: The patient mentions that she is feeling better no abdominal pain no chest pain. No nausea no vomiting. She did have a bowel movement this morning. Nutrition: Tolerating diet  Brief hospital course: Pt. with PMH of HTN, dyslipidemia, hypothyroidism, PUD, ischemic toe with osteomyelitis, chronic gastric distention; admitted on 09/11/2015, with complaint of abdominal pain, was found to have pyloric stenosis, status post dilatation after EGD and improving better. Also had healthcare associated pneumonia which is treated and improving. Currently further plan is continue IV antibiotics.  Assessment and Plan: 1. HCAP - likely aspiration, from gastric outlet obstruction, appears nontoxic currently afebrile, Was treated with vancomycin and cefepime, cultures negative will taper down to Unasyn.  2. Gastric outlet obstruction due to pyloric stenosis. Much better after NG tube for gastric decompression, since she was on liquids it is held and she is on heparin bridge for A. fib, GI on board to for EGD on 09/15/2015. Continue bowel rest with IV fluids for now.  3. Right third toe gangrene. On antibiotics, refused amputation last admission, again refuses, confirmed with her daughter. Suppressive antibiotic course ongoing for 30 days. After antibiotic course she wants to continue with wound care and supportive care. If worst daughter requested to consider comfort care.  4. Dyslipidemia. On statin.  5. Depression. On Zoloft.  6. Hypothyroidism recently Synthroid increased to 88 g few days ago, repeat TSH in 4 weeks from now.  7. GERD. On PPI  8. Paroxysmal atrial fibrillation with RVR [place] vasc 2 score of 5. Continue amiodarone and Coreg. Hold Eliquis bridge her with heparin as she  to go EGD with possible pyloric dilation on 09/15/2015, last Eliquis does 09/12/2015 evening.  9. History of asthma COPD. Stable.  10. Hypokalemia. Replaced.  11. Mild normal anion gap metabolic acidosis due to hyperchloremia. Switch to D5W. Stop normal saline.  Pain management: When necessary Tylenol Activity: Consulted physical therapy Bowel regimen: last BM 09/16/2015 Diet: Bariatric liquid diet DVT Prophylaxis: on therapeutic anticoagulation.  Advance goals of care discussion: Partial, no CPR, no intubation, yes to defibrillation  Family Communication: no family was present at bedside, at the time of interview.   Disposition:  Discharge to likely home with home health versus SNF. Expected discharge date: 09/18/2015, pending tolerance of soft diet  Consultants: Gastroenterology Procedures: EGD with dilatation of pyloric stenosis  Antibiotics: Anti-infectives    Start     Dose/Rate Route Frequency Ordered Stop   09/16/15 1200  erythromycin 250 mg in sodium chloride 0.9 % 100 mL IVPB     250 mg 100 mL/hr over 60 Minutes Intravenous Every 6 hours 09/16/15 0846     09/15/15 1800  ampicillin-sulbactam (UNASYN) 1.5 g in sodium chloride 0.9 % 50 mL IVPB     1.5 g 100 mL/hr over 30 Minutes Intravenous Every 6 hours 09/15/15 1103     09/15/15 1715  erythromycin (E-MYCIN) tablet 250 mg  Status:  Discontinued     250 mg Oral 3 times daily before meals & bedtime 09/15/15 1656 09/16/15 0846   09/13/15 0600  vancomycin (VANCOCIN) IVPB 750 mg/150 ml premix  Status:  Discontinued     750 mg 150 mL/hr over 60 Minutes Intravenous Every 24 hours 09/12/15 0546 09/15/15 1036   09/12/15 2200  ceFEPIme (MAXIPIME)  1 g in dextrose 5 % 50 mL IVPB  Status:  Discontinued     1 g 100 mL/hr over 30 Minutes Intravenous Daily at bedtime 09/12/15 0410 09/15/15 1036   09/12/15 0430  ceFEPIme (MAXIPIME) 1 g in dextrose 5 % 50 mL IVPB     1 g 100 mL/hr over 30 Minutes Intravenous  Once 09/12/15 0427  09/12/15 0540   09/12/15 0100  vancomycin (VANCOCIN) IVPB 1000 mg/200 mL premix     1,000 mg 200 mL/hr over 60 Minutes Intravenous  Once 09/12/15 0046 09/12/15 0400   09/12/15 0100  piperacillin-tazobactam (ZOSYN) IVPB 3.375 g     3.375 g 100 mL/hr over 30 Minutes Intravenous  Once 09/12/15 0046 09/12/15 0222        Intake/Output Summary (Last 24 hours) at 09/16/15 1949 Last data filed at 09/16/15 1745  Gross per 24 hour  Intake    720 ml  Output   1150 ml  Net   -430 ml   Filed Weights   09/11/15 2303 09/12/15 0241 09/15/15 1412  Weight: 68.04 kg (150 lb) 66.724 kg (147 lb 1.6 oz) 66.679 kg (147 lb)    Objective: Physical Exam: Filed Vitals:   09/15/15 2050 09/16/15 0447 09/16/15 0903 09/16/15 1300  BP: 163/69 144/61  137/68  Pulse: 62 91  80  Temp: 98.3 F (36.8 C) 98.1 F (36.7 C)  98 F (36.7 C)  TempSrc: Oral Oral  Oral  Resp: 18 18  20   Height:      Weight:      SpO2: 100% 100% 97% 99%    General: Alert, Awake and Oriented to Time, Place and Person. Appear in mild distress Eyes: PERRL, Conjunctiva normal ENT: Oral Mucosa clear moist. Neck: no JVD, no Abnormal Mass Or lumps Cardiovascular: S1 and S2 Present, aortic systolic Murmur, Respiratory: Bilateral Air entry equal and Decreased, Clear to Auscultation, no Crackles, no wheezes Abdomen: Bowel Sound present, Soft and no tenderness Skin: no redness, no Rash  Extremities: no Pedal edema, no calf tenderness Neurologic: Grossly no focal neuro deficit. Bilaterally Equal motor strength  Data Reviewed: CBC:  Recent Labs Lab 09/10/15 1213  09/11/15 2351 09/12/15 0040 09/13/15 0358 09/14/15 0435 09/15/15 0456 09/16/15 0322  WBC 19.1*  --  18.7*  --  16.5* 15.7* 14.4* 10.6*  NEUTROABS 7.7*  --  8.7*  --   --   --   --   --   HGB  --   < > 10.8* 11.9* 8.7* 9.4* 10.0* 8.8*  HCT 32.5*  < > 31.1* 35.0* 25.4* 27.7* 29.2* 25.4*  MCV 99*  --  96.6  --  94.8 95.2 94.5 95.1  PLT 337  --  335  --  273 306 318  281  < > = values in this interval not displayed. Basic Metabolic Panel:  Recent Labs Lab 09/11/15 2351 09/12/15 0040 09/13/15 0358 09/14/15 0435 09/15/15 0456 09/16/15 0322  NA 139 142 142 141 140 137  K 5.3* 5.0 3.1* 3.6 4.0 3.5  CL 105 104 110 112* 112* 110  CO2 25  --  25 21* 17* 22  GLUCOSE 115* 108* 82 56* 60* 83  BUN 19 25* 16 13 10 7   CREATININE 1.52* 1.50* 1.38* 1.22* 1.16* 1.01*  CALCIUM 9.7  --  8.4* 8.5* 8.4* 8.0*  MG  --   --   --  1.6*  --  1.7  PHOS  --   --   --   --   --  1.8*    Liver Function Tests:  Recent Labs Lab 09/11/15 2351  AST 43*  ALT 24  ALKPHOS 59  BILITOT 1.2  PROT 7.6  ALBUMIN 3.7    Recent Labs Lab 09/11/15 2351  LIPASE 81*   No results for input(s): AMMONIA in the last 168 hours. Coagulation Profile:  Recent Labs Lab 09/13/15 0848  INR 1.37   Cardiac Enzymes: No results for input(s): CKTOTAL, CKMB, CKMBINDEX, TROPONINI in the last 168 hours. BNP (last 3 results) No results for input(s): PROBNP in the last 8760 hours.  CBG:  Recent Labs Lab 09/15/15 0735 09/15/15 0915 09/15/15 1805  GLUCAP 55* 111* 117*    Studies: No results found.   Scheduled Meds: . amiodarone  100 mg Oral Daily  . ampicillin-sulbactam (UNASYN) IV  1.5 g Intravenous Q6H  . apixaban  2.5 mg Oral BID  . atorvastatin  10 mg Oral q1800  . carvedilol  3.125 mg Oral BID WC  . chlorhexidine  15 mL Mouth/Throat BID  . cycloSPORINE  1 drop Both Eyes BID  . erythromycin  250 mg Intravenous Q6H  . famotidine (PEPCID) IV  20 mg Intravenous Q12H  . fluticasone  2 spray Each Nare Daily  . gabapentin  300 mg Oral Daily  . gabapentin  600 mg Oral 2 times per day  . latanoprost  1 drop Both Eyes QHS  . levothyroxine  50 mcg Intravenous Daily  . mometasone-formoterol  2 puff Inhalation BID  . nitroGLYCERIN  0.5 inch Topical Q6H  . polyvinyl alcohol  1 drop Both Eyes QID  . potassium phosphate (monobasic)  500 mg Oral TID WC & HS  . sertraline   100 mg Oral Daily  . tamsulosin  0.4 mg Oral Daily   Continuous Infusions:  PRN Meds: acetaminophen, albuterol, bisacodyl, diclofenac sodium, hydrALAZINE, metoCLOPramide, metoprolol, ondansetron (ZOFRAN) IV, phenol  Time spent: 30 minutes  Author: Lynden Oxford, MD [facility] Pager: [phone] 09/16/2015 7:49 PM  If 7PM-7AM, please contact night-coverage at [email or web address], password [facility]

## 2015-09-16 NOTE — Consult Note (Signed)
[facility] CM Inpatient Consult   09/16/2015  Emmilou Ruhe 11-05-1923 [id number]    [facility] Care Management follow up. Spoke with inpatient RNCM prior to speaking with patient's daughter, [name], via phone. Explained and discussed [facility] Care Management services with [name], per patient's request. [name] pleasantly declines [facility] Care Management program. 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 Made her aware that writer will leave brochure and contact information at bedside to call if needed in the future. Made inpatient RNCM aware that [facility] Care Management services were declined.    Raiford Noble, MSN-Ed, RN,BSN [facility] Liaison [phone]

## 2015-09-16 NOTE — Progress Notes (Signed)
Progress Note   Subjective  Shannon [name] Is a 82 year old African-American female who was admitted to the hospital in 09/11/15 for repeat symptoms of gastric outlet obstruction. Patient did have an EGD yesterday which showed mucosal changes in the esophagus consistent with NG tube trauma, retained gastric fluid/small food fragments-no bezoar, a previous surgical anastomosis was found, characterized by moderate stenosis. Dilated to 15 mm and normal examined small bowel. The patient was started on Erythromycin 250 mg 4 times daily, before meals and again at bedtime. She was also restarted on Reglan 3 times a day. Her NG tube was removed on 09/15/15.  This morning, the patient is found sitting comfortably in bed. She tells me that she was able to tolerate a liquid diet last night. She denies any further bowel movements since yesterday, but overall "feels better". Patient denies any new complaints or concerns.   Objective   Vital signs in last 24 hours: Temp:  [97.8 F (36.6 C)-98.3 F (36.8 C)] 98.1 F (36.7 C) (06/15 0447) Pulse Rate:  [61-91] 91 (06/15 0447) Resp:  [14-20] 18 (06/15 0447) BP: (101-174)/(45-75) 144/61 mmHg (06/15 0447) SpO2:  [92 %-100 %] 97 % (06/15 0903) Weight:  [147 lb (66.679 kg)] 147 lb (66.679 kg) (06/14 1412) Last BM Date: 09/15/15 General: Pleasant African American female in NAD Heart: Regular rate and rhythm; no murmurs Lungs: Respirations even and unlabored, lungs CTA bilaterally Abdomen: Somewhat better, decreased tenderness and distension; Normal bowel sounds. Extremities: Without edema. Neurologic: Alert and oriented, grossly normal neurologically. Psych: Cooperative. Normal mood and affect.  Intake/Output from previous day: 06/14 0701 - 06/15 0700 In: 1933.8 [P.O.:240; I.V.:1593.8; IV Piggyback:100] Out: 750 [Urine:750] Intake/Output this shift: Total I/O In: 240 [P.O.:240] Out: 150 [Urine:150]  Lab Results:  Recent Labs  09/14/15 0435  09/15/15 0456 09/16/15 0322  WBC 15.7* 14.4* 10.6*  HGB 9.4* 10.0* 8.8*  HCT 27.7* 29.2* 25.4*  PLT 306 318 281   BMET  Recent Labs  09/14/15 0435 09/15/15 0456 09/16/15 0322  NA 141 140 137  K 3.6 4.0 3.5  CL 112* 112* 110  CO2 21* 17* 22  GLUCOSE 56* 60* 83  BUN 13 10 7   CREATININE 1.22* 1.16* 1.01*  CALCIUM 8.5* 8.4* 8.0*   LFT No results for input(s): PROT, ALBUMIN, AST, ALT, ALKPHOS, BILITOT, BILIDIR, IBILI in the last 72 hours. PT/INR No results for input(s): LABPROT, INR in the last 72 hours.  Studies/Results: No results found.     Assessment / Plan:   Impression: 1. Nausea and vomiting in patient with history of the same: Complicated patient with multiple abdominal surgeries, history of small bowel obstructions, pyloric stenosis, functional gastric outlet obstruction; Candida esophagitis on latest EGD on 07/08/2015; recent admission with improvement of symptoms after NG tube-patient returned hospital 4 days after last discharge with continued symptoms; CT abdomen and pelvis at time of admission showed small left pleural effusion with findings concerning for bibasilar pneumonia, severe gastric distention likely related to chronic gastric dysmotility or partial gastric outlet obstruction, mild haziness of the proximal duodenum concerning for duodenitis with no evidence of small bowel obstruction, esophagitis and colonic diverticulosis without active inflammatory changes; patient improved after NG tube placement and removal of almost 3000 mL of gastric contents; EGD on 09/15/15 revealed moderate stenosis in the previous surgical anastomosis which was dilated to 15 mm, there also food contents found in the stomach indicating possible gastroparesis. Patient was started on Reglan 3 times a day on 09/15/2015 as well  as erythromycin 250 mg IV 4 times daily-at time of interview this morning patient is feeling better and able to tolerate a liquid diet. 2. Constipation: Controlled on  twice a day MiraLAX at home, per daughter this causes her to be nauseous and having a lot of gas.  Plan: 1. Continue Reglan and Erythromycin as prescribed. 2. Daughter is requesting a bedside commode for when the patient is discharged 3. Continue supportive measures 4. Will increase diet to thick liquids today, if patient does well, will consider solids tomorrow 5. Will discuss above with Dr. Havery Moros, please await any further recommendations  Thank you for your kind consultation, we will continue to follow.  Principal Problem:   HCAP (healthcare-associated pneumonia) Active Problems:   Essential hypertension, benign   DM (diabetes mellitus) type II uncontrolled, periph vascular disorder (HCC)   Hypothyroidism   Depression   Anemia of chronic disease   Gastric distention   Acute renal failure superimposed on stage 3 chronic kidney disease (HCC)   Atrial fibrillation (HCC)   N&V (nausea and vomiting)   Diabetic osteomyelitis (HCC)   PVD (peripheral vascular disease) (HCC)   Hyperkalemia   Osteomyelitis of ankle or foot ([place])   Pressure ulcer   LOS: 4 days   [name]  09/16/2015, 9:13 AM  Pager # [phone]

## 2015-09-16 NOTE — Progress Notes (Signed)
HHPT ordered will follow up for [facility] needs.

## 2015-09-16 NOTE — Progress Notes (Signed)
ANTICOAGULATION CONSULT NOTE - Initial Consult  Pharmacy Consult for Apixaban Indication: Atrial Fibrillation  No Known Allergies  Patient Measurements: Height: 5\' 6"  (167.6 cm) Weight: 147 lb (66.679 kg) IBW/kg (Calculated) : 59.3 Heparin Dosing Weight:   Vital Signs: Temp: 98.1 F (36.7 C) (06/15 0447) Temp Source: Oral (06/15 0447) BP: 144/61 mmHg (06/15 0447) Pulse Rate: 91 (06/15 0447)  Labs:  Recent Labs  09/14/15 0435  09/14/15 2226 09/15/15 0456 09/16/15 0322  HGB 9.4*  --   --  10.0* 8.8*  HCT 27.7*  --   --  29.2* 25.4*  PLT 306  --   --  318 281  APTT 146*  < > 93* 86* 37  HEPARINUNFRC 0.88*  < > 1.22* 1.16* 0.73*  CREATININE 1.22*  --   --  1.16* 1.01*  < > = values in this interval not displayed.  Estimated Creatinine Clearance: 34.7 mL/min (by C-G formula based on Cr of 1.01).   Medical History: Past Medical History  Diagnosis Date  . Pneumonia   . Hypertension   . Small bowel obstruction ([place])     "I don't know how many" (01/11/2015)  . Ventral hernia with bowel obstruction   . Asthma   . Hyperlipidemia   . Stroke ([place])     "light one"  . COPD (chronic obstructive pulmonary disease) ([place])   . Hypothyroidism   . Type II diabetes mellitus ([place])   . Anemia     previous blood transfusions  . GERD (gastroesophageal reflux disease)   . History of stomach ulcers   . Arthritis     "all over"  . Renal insufficiency   . Depression     "light case"  . SIADH (syndrome of inappropriate ADH production) ([place])     [name] 01/10/2015  . Perforated gastric ulcer ([place])   . Gastric stenosis   . Paraesophageal hernia   . Bradycardia     topped BB and amiodarone decreased- junctional rhythm    Assessment: 89 yoF on chronic apixaban for hx atrial fibrillation hospitalized for GOO, transitioned to IV heparin which was then held for EGD on 6/14 with dilation of moderate stenosis and suction of retained fluid and small food fragments. GI ok with resuming  anticoagulation today 6/15.  Pt tolerating liquid diet, advanced to thick liquids.  Pharmacy consulted to resume pt's home dose apixaban.    PTA apixaban 2.5mg  PO BID CBC today shows low hgb, plts WNL.  SCr improving and CrCl ~35 ml/min.   Goal of Therapy:  Monitor platelets by anticoagulation protocol: Yes   Plan:  Resume apixaban 2.5mg  po BID. F/u CBC, signs/symtoms of bleeding F/u renal function, ability to tolerate oral medications.   Ralene Bathe, PharmD, BCPS 09/16/2015, 2:46 PM  Pager: [phone]

## 2015-09-17 LAB — COMPREHENSIVE METABOLIC PANEL
ALT: 16 U/L (ref 14–54)
AST: 23 U/L (ref 15–41)
Albumin: 2.5 g/dL — ABNORMAL LOW (ref 3.5–5.0)
Alkaline Phosphatase: 40 U/L (ref 38–126)
Anion gap: 5 (ref 5–15)
BUN: <5 mg/dL — ABNORMAL LOW (ref 6–20)
CO2: 22 mmol/L (ref 22–32)
Calcium: 8.2 mg/dL — ABNORMAL LOW (ref 8.9–10.3)
Chloride: 109 mmol/L (ref 101–111)
Creatinine, Ser: 1.04 mg/dL — ABNORMAL HIGH (ref 0.44–1.00)
GFR calc Af Amer: 53 mL/min — ABNORMAL LOW (ref >60)
GFR calc non Af Amer: 46 mL/min — ABNORMAL LOW (ref >60)
Glucose, Bld: 81 mg/dL (ref 65–99)
Potassium: 3.7 mmol/L (ref 3.5–5.1)
Sodium: 136 mmol/L (ref 135–145)
Total Bilirubin: 0.6 mg/dL (ref 0.3–1.2)
Total Protein: 5.8 g/dL — ABNORMAL LOW (ref 6.5–8.1)

## 2015-09-17 LAB — CBC WITH DIFFERENTIAL/PLATELET
Basophils Absolute: 0.1 10*3/uL (ref 0.0–0.1)
Basophils Relative: 1 %
Eosinophils Absolute: 6.1 10*3/uL — ABNORMAL HIGH (ref 0.0–0.7)
Eosinophils Relative: 56 %
HCT: 25.4 % — ABNORMAL LOW (ref 36.0–46.0)
Hemoglobin: 8.8 g/dL — ABNORMAL LOW (ref 12.0–15.0)
Lymphocytes Relative: 11 %
Lymphs Abs: 1.2 10*3/uL (ref 0.7–4.0)
MCH: 32.2 pg (ref 26.0–34.0)
MCHC: 34.6 g/dL (ref 30.0–36.0)
MCV: 93 fL (ref 78.0–100.0)
Monocytes Absolute: 0.6 10*3/uL (ref 0.1–1.0)
Monocytes Relative: 6 %
Neutro Abs: 2.8 10*3/uL (ref 1.7–7.7)
Neutrophils Relative %: 26 %
Platelets: 268 10*3/uL (ref 150–400)
RBC: 2.73 MIL/uL — ABNORMAL LOW (ref 3.87–5.11)
RDW: 17.9 % — ABNORMAL HIGH (ref 11.5–15.5)
WBC: 10.8 10*3/uL — ABNORMAL HIGH (ref 4.0–10.5)

## 2015-09-17 LAB — CULTURE, BLOOD (ROUTINE X 2)
Culture: NO GROWTH
Culture: NO GROWTH

## 2015-09-17 LAB — GLUCOSE, CAPILLARY: Glucose-Capillary: 93 mg/dL (ref 65–99)

## 2015-09-17 LAB — MAGNESIUM: Magnesium: 1.5 mg/dL — ABNORMAL LOW (ref 1.7–2.4)

## 2015-09-17 MED ORDER — MAGNESIUM SULFATE 2 GM/50ML IV SOLN
2.0000 g | Freq: Once | INTRAVENOUS | Status: AC
  Administered 2015-09-17: 2 g via INTRAVENOUS
  Filled 2015-09-17: qty 50

## 2015-09-17 MED ORDER — PANTOPRAZOLE SODIUM 40 MG PO TBEC
40.0000 mg | DELAYED_RELEASE_TABLET | Freq: Every day | ORAL | Status: DC
  Administered 2015-09-17 – 2015-09-30 (×13): 40 mg via ORAL
  Filled 2015-09-17 (×13): qty 1

## 2015-09-17 MED ORDER — LEVOTHYROXINE SODIUM 88 MCG PO TABS
88.0000 ug | ORAL_TABLET | Freq: Every day | ORAL | Status: DC
  Administered 2015-09-17 – 2015-09-18 (×2): 88 ug via ORAL
  Filled 2015-09-17 (×2): qty 1

## 2015-09-17 NOTE — Progress Notes (Signed)
[facility] Progress Note  Patient: Shannon [name] XBJ:[id number]   PCP: Kirt Boys, DO DOB: 1924/03/16   DOA: 09/11/2015   DOS: 09/17/2015   Date of Service: the patient was seen and examined on 09/17/2015  Subjective: The patient had diarrhea with 4 loose bowel movements yesterday. Has been able to tolerate oral liquid diet. After advancing her diet this morning patient has been having difficulty with pured diet. Nutrition: Tolerating diet  Brief hospital course: Pt. with PMH of HTN, dyslipidemia, hypothyroidism, PUD, ischemic toe with osteomyelitis, chronic gastric distention; admitted on 09/11/2015, with complaint of abdominal pain, was found to have pyloric stenosis, status post dilatation after EGD and improving better. Also had healthcare associated pneumonia which is treated and improving. Currently further plan is continue IV antibiotics, and monitor for tolerance of pure diet  Assessment and Plan: 1. HCAP - likely aspiration, from gastric outlet obstruction, appears nontoxic currently afebrile, Was treated with vancomycin and cefepime, cultures negative will taper down to Unasyn. Transition to oral when patient is able to take orally comfortably.  2. Gastric outlet obstruction due to pyloric stenosis. Improved after EGD and dilatation but still not back to baseline Discontinue IV fluids. As per GI patient may need PEJ tube placement if she is unable to tolerate oral diet.  3. Right third toe gangrene. On antibiotics, refused amputation last admission, again refuses, confirmed with her daughter. Suppressive antibiotic course ongoing for 30 days. After antibiotic course she wants to continue with wound care and supportive care. If worst daughter requested to consider comfort care. Last day antibiotics 10/07/2015.  4. Dyslipidemia. On statin.  5. Depression. On Zoloft.  6. Hypothyroidism recently Synthroid increased to 88 g few days ago, repeat TSH in 4 weeks from now.  7.  GERD. On PPI  8. Paroxysmal atrial fibrillation with RVR [place] vasc 2 score of 5. Continue amiodarone and Coreg. Continue Apixaban  9. History of asthma COPD. Stable.  10. Hypokalemia. Replaced.  Pain management: When necessary Tylenol Activity: Consulted physical therapy Bowel regimen: last BM 09/16/2015 Diet: Bariatric liquid diet DVT Prophylaxis: on therapeutic anticoagulation.  Advance goals of care discussion: Partial, no CPR, no intubation, yes to defibrillation  Family Communication: no family was present at bedside, at the time of interview.   Disposition:  Discharge to likely home with home health versus SNF. Expected discharge date: 09/20/2015, pending tolerance of soft diet  Consultants: Gastroenterology Procedures: EGD with dilatation of pyloric stenosis  Antibiotics: Anti-infectives    Start     Dose/Rate Route Frequency Ordered Stop   09/16/15 1200  erythromycin 250 mg in sodium chloride 0.9 % 100 mL IVPB  Status:  Discontinued     250 mg 100 mL/hr over 60 Minutes Intravenous Every 6 hours 09/16/15 0846 09/17/15 1002   09/15/15 1800  ampicillin-sulbactam (UNASYN) 1.5 g in sodium chloride 0.9 % 50 mL IVPB     1.5 g 100 mL/hr over 30 Minutes Intravenous Every 6 hours 09/15/15 1103     09/15/15 1715  erythromycin (E-MYCIN) tablet 250 mg  Status:  Discontinued     250 mg Oral 3 times daily before meals & bedtime 09/15/15 1656 09/16/15 0846   09/13/15 0600  vancomycin (VANCOCIN) IVPB 750 mg/150 ml premix  Status:  Discontinued     750 mg 150 mL/hr over 60 Minutes Intravenous Every 24 hours 09/12/15 0546 09/15/15 1036   09/12/15 2200  ceFEPIme (MAXIPIME) 1 g in dextrose 5 % 50 mL IVPB  Status:  Discontinued  1 g 100 mL/hr over 30 Minutes Intravenous Daily at bedtime 09/12/15 0410 09/15/15 1036   09/12/15 0430  ceFEPIme (MAXIPIME) 1 g in dextrose 5 % 50 mL IVPB     1 g 100 mL/hr over 30 Minutes Intravenous  Once 09/12/15 0427 09/12/15 0540   09/12/15 0100   vancomycin (VANCOCIN) IVPB 1000 mg/200 mL premix     1,000 mg 200 mL/hr over 60 Minutes Intravenous  Once 09/12/15 0046 09/12/15 0400   09/12/15 0100  piperacillin-tazobactam (ZOSYN) IVPB 3.375 g     3.375 g 100 mL/hr over 30 Minutes Intravenous  Once 09/12/15 0046 09/12/15 0222        Intake/Output Summary (Last 24 hours) at 09/17/15 1614 Last data filed at 09/17/15 1400  Gross per 24 hour  Intake   1650 ml  Output   1575 ml  Net     75 ml   Filed Weights   09/11/15 2303 09/12/15 0241 09/15/15 1412  Weight: 68.04 kg (150 lb) 66.724 kg (147 lb 1.6 oz) 66.679 kg (147 lb)    Objective: Physical Exam: Filed Vitals:   09/16/15 2200 09/17/15 0200 09/17/15 0451 09/17/15 1300  BP: 156/84  150/83 136/78  Pulse: 77  90 87  Temp: 98.8 F (37.1 C)  98.9 F (37.2 C) 98.7 F (37.1 C)  TempSrc: Oral  Oral Oral  Resp: 18  18 20   Height:      Weight:      SpO2: 99% 95% 98% 99%    General: Alert, Awake and Oriented to Time, Place and Person. Appear in mild distress Eyes: PERRL, Conjunctiva normal ENT: Oral Mucosa clear moist. Neck: no JVD, no Abnormal Mass Or lumps Cardiovascular: S1 and S2 Present, aortic systolic Murmur, Respiratory: Bilateral Air entry equal and Decreased, Clear to Auscultation, no Crackles, no wheezes Abdomen: Bowel Sound present, Soft and no tenderness Skin: no redness, no Rash  Extremities: no Pedal edema, no calf tenderness Neurologic: Grossly no focal neuro deficit. Bilaterally Equal motor strength  Data Reviewed: CBC:  Recent Labs Lab 09/11/15 2351  09/13/15 0358 09/14/15 0435 09/15/15 0456 09/16/15 0322 09/17/15 0415  WBC 18.7*  --  16.5* 15.7* 14.4* 10.6* 10.8*  NEUTROABS 8.7*  --   --   --   --   --  2.8  HGB 10.8*  < > 8.7* 9.4* 10.0* 8.8* 8.8*  HCT 31.1*  < > 25.4* 27.7* 29.2* 25.4* 25.4*  MCV 96.6  --  94.8 95.2 94.5 95.1 93.0  PLT 335  --  273 306 318 281 268  < > = values in this interval not displayed. Basic Metabolic  Panel:  Recent Labs Lab 09/13/15 0358 09/14/15 0435 09/15/15 0456 09/16/15 0322 09/17/15 0415  NA 142 141 140 137 136  K 3.1* 3.6 4.0 3.5 3.7  CL 110 112* 112* 110 109  CO2 25 21* 17* 22 22  GLUCOSE 82 56* 60* 83 81  BUN 16 13 10 7  <5*  CREATININE 1.38* 1.22* 1.16* 1.01* 1.04*  CALCIUM 8.4* 8.5* 8.4* 8.0* 8.2*  MG  --  1.6*  --  1.7 1.5*  PHOS  --   --   --  1.8*  --     Liver Function Tests:  Recent Labs Lab 09/11/15 2351 09/17/15 0415  AST 43* 23  ALT 24 16  ALKPHOS 59 40  BILITOT 1.2 0.6  PROT 7.6 5.8*  ALBUMIN 3.7 2.5*    Recent Labs Lab 09/11/15 2351  LIPASE 81*  No results for input(s): AMMONIA in the last 168 hours. Coagulation Profile:  Recent Labs Lab 09/13/15 0848  INR 1.37   Cardiac Enzymes: No results for input(s): CKTOTAL, CKMB, CKMBINDEX, TROPONINI in the last 168 hours. BNP (last 3 results) No results for input(s): PROBNP in the last 8760 hours.  CBG:  Recent Labs Lab 09/15/15 0735 09/15/15 0915 09/15/15 1805 09/17/15 0025  GLUCAP 55* 111* 117* 93    Studies: No results found.   Scheduled Meds: . amiodarone  100 mg Oral Daily  . ampicillin-sulbactam (UNASYN) IV  1.5 g Intravenous Q6H  . apixaban  2.5 mg Oral BID  . atorvastatin  10 mg Oral q1800  . carvedilol  3.125 mg Oral BID WC  . chlorhexidine  15 mL Mouth/Throat BID  . cycloSPORINE  1 drop Both Eyes BID  . fluticasone  2 spray Each Nare Daily  . gabapentin  300 mg Oral Daily  . gabapentin  600 mg Oral 2 times per day  . latanoprost  1 drop Both Eyes QHS  . levothyroxine  88 mcg Oral QAC breakfast  . mometasone-formoterol  2 puff Inhalation BID  . nitroGLYCERIN  0.5 inch Topical Q6H  . pantoprazole  40 mg Oral Daily  . polyvinyl alcohol  1 drop Both Eyes QID  . sertraline  100 mg Oral Daily   Continuous Infusions:  PRN Meds: acetaminophen, albuterol, bisacodyl, diclofenac sodium, hydrALAZINE, metoCLOPramide, metoprolol, ondansetron (ZOFRAN) IV, phenol  Time  spent: 30 minutes  Author: Lynden Oxford, MD [facility] Pager: [phone] 09/17/2015 4:14 PM  If 7PM-7AM, please contact night-coverage at [email or web address], password [facility]

## 2015-09-17 NOTE — Progress Notes (Signed)
Progress Note   Subjective  [name] is a 82 y/o AA female who was admitted to the hospital on 09/11/15 for repeat symptoms of GOO. EGD 09/15/15 showed stenosis at surgical anastamosis which was dilated. Pt started that day on Erythromycin and Reglan. Did well yesterday on full liquid diet.   Pt denies abdominal pain or tenderness today. She did have two small liquid BM yesterday and has done well with full liquid diet. She denies new complaints or concerns.   Objective   Vital signs in last 24 hours: Temp:  [98 F (36.7 C)-98.9 F (37.2 C)] 98.9 F (37.2 C) (06/16 0451) Pulse Rate:  [77-90] 90 (06/16 0451) Resp:  [18-20] 18 (06/16 0451) BP: (137-156)/(68-84) 150/83 mmHg (06/16 0451) SpO2:  [95 %-99 %] 98 % (06/16 0451) Last BM Date: 09/16/15 General: Pleasant African American female in NAD Heart: Regular rate and rhythm; no murmurs Lungs: Respirations even and unlabored, lungs CTA bilaterally Abdomen: Continues to improve with decreased tenderness and distension; Normal bowel sounds. Extremities: Without edema. Neurologic: Alert and oriented, grossly normal neurologically. Psych: Cooperative. Normal mood and affect.  Intake/Output from previous day: 06/15 0701 - 06/16 0700 In: 1810 [P.O.:960; IV Piggyback:850] Out: 1350 [Urine:1350]  Lab Results:  Recent Labs  09/15/15 0456 09/16/15 0322 09/17/15 0415  WBC 14.4* 10.6* 10.8*  HGB 10.0* 8.8* 8.8*  HCT 29.2* 25.4* 25.4*  PLT 318 281 268   BMET  Recent Labs  09/15/15 0456 09/16/15 0322 09/17/15 0415  NA 140 137 136  K 4.0 3.5 3.7  CL 112* 110 109  CO2 17* 22 22  GLUCOSE 60* 83 81  BUN 10 7 <5*  CREATININE 1.16* 1.01* 1.04*  CALCIUM 8.4* 8.0* 8.2*   LFT  Recent Labs  09/17/15 0415  PROT 5.8*  ALBUMIN 2.5*  AST 23  ALT 16  ALKPHOS 40  BILITOT 0.6   PT/INR No results for input(s): LABPROT, INR in the last 72 hours.  Studies/Results: No results found.     Assessment / Plan:     Impression: 1. Nausea and vomiting in patient with history of the same: Complicated patient with multiple abdominal surgeries, history of small bowel obstructions, pyloric stenosis, functional gastric outlet obstruction; Candida esophagitis on latest EGD on 07/08/2015; recent admission with improvement of symptoms after NG tube-patient returned hospital 4 days after last discharge with continued symptoms; CT abdomen and pelvis at time of admission showed small left pleural effusion with findings concerning for bibasilar pneumonia, severe gastric distention likely related to chronic gastric dysmotility or partial gastric outlet obstruction, mild haziness of the proximal duodenum concerning for duodenitis with no evidence of small bowel obstruction, esophagitis and colonic diverticulosis without active inflammatory changes; patient improved after NG tube placement and removal of almost 3000 mL of gastric contents; EGD on 09/15/15 revealed moderate stenosis in the previous surgical anastomosis which was dilated to 15 mm, there also food contents found in the stomach indicating possible gastroparesis. Patient was started on Reglan 3 times a day on 09/15/2015 as well as erythromycin 250 mg IV 4 times daily-at time of interview this morning patient continues to feel better and tells me she tolerated full liquid diet yesterday 2. Constipation: Controlled on twice a day MiraLAX at home, per daughter this causes her to be nauseous and having a lot of gas.  Plan: 1. Continue Reglan and Erythromycin as prescribed. 2. Continue supportive measures 3. Will advance to soft food diet today-discussed with nursing to ensure she only  eats small portions at a time 4. Will discuss above with Dr. Havery Moros, please await any further recs  Thank you for your kind consultation, we will continue to follow.  Principal Problem:   HCAP (healthcare-associated pneumonia) Active Problems:   Essential hypertension, benign   DM  (diabetes mellitus) type II uncontrolled, periph vascular disorder (HCC)   Hypothyroidism   Depression   Anemia of chronic disease   Gastric distention   Acute renal failure superimposed on stage 3 chronic kidney disease (HCC)   Atrial fibrillation (HCC)   N&V (nausea and vomiting)   Diabetic osteomyelitis (HCC)   PVD (peripheral vascular disease) (HCC)   Hyperkalemia   Osteomyelitis of ankle or foot ([place])   Pressure ulcer     LOS: 5 days   [name]  09/17/2015, 8:57 AM  Pager # [phone]

## 2015-09-17 NOTE — Progress Notes (Signed)
Pharmacy Antibiotic Note  Shannon [name] is a 82 y.o. female admitted on 09/11/2015 with pneumonia, osteomyelitis of right 3rd toe.  Pharmacy was consulted for Vancomycin, cefepime dosing.  Antibiotics changed to ampicillin/sulbactam 6/14.  Day #6 antibiotics - Renal: SCr improved, stable at 1.04.  CrCl calculated at 17ml/min - WBC slightly elevated but improving - afebrile  Plan:  Continue Unasyn 1.5gm IV q6h   Suggest convert to oral augment 500mg  BID as prescribed prior to admission.  Plan was to continue x 30 days (until 7/4)  Follow renal function  Height: 5\' 6"  (167.6 cm) Weight: 147 lb (66.679 kg) IBW/kg (Calculated) : 59.3  Temp (24hrs), Avg:98.6 F (37 C), Min:98 F (36.7 C), Max:98.9 F (37.2 C)   Recent Labs Lab 09/12/15 0039  09/13/15 0358 09/14/15 0435 09/15/15 0456 09/16/15 0322 09/17/15 0415  WBC  --   --  16.5* 15.7* 14.4* 10.6* 10.8*  CREATININE  --   < > 1.38* 1.22* 1.16* 1.01* 1.04*  LATICACIDVEN 1.52  --   --   --   --   --   --   < > = values in this interval not displayed.  Estimated Creatinine Clearance: 33.7 mL/min (by C-G formula based on Cr of 1.04).    No Known Allergies  Antimicrobials this admission: Vancomycin 6/11 >> 6/14 Cefepime 6/11 >> 6/14 Amp/sulb 6/14 >>  Dose adjustments this admission: (previous admission, 6/1 VT = 14 on vanc 750 q24h with SCr 1.4-1.6 range)  Microbiology results: 6/11 BCx: NGTD 6/11 UCx: 8K insignificant growth 6/13 MRSA PCR: neg (5/27 Cdiff neg)   Thank you for allowing pharmacy to be a part of this patient's care.  Doreene Eland, PharmD, BCPS.   Pager: [phone] 09/17/2015 8:45 AM

## 2015-09-17 NOTE — Progress Notes (Signed)
1430: Nurse on floor called to report that pt has had a lot of "burping" and is feeling "full" and somewhat "nauseous" after only eating about 10% of her breakfast and lunch soft diet today. Discussed that I would switch her back to full liquid diet for her night time meal and see how she does. JLL

## 2015-09-17 NOTE — Evaluation (Signed)
Physical Therapy Evaluation Patient Details Name: Shannon [name] MRN: [id number] DOB: 1923/12/12 Today's Date: 09/17/2015   History of Present Illness  82 yo female admitted with HCAP and Gastic outlet obstruction 2* pyloric stenosis. Hx of HTN, Osteomyelitis of 3rd toe on R foot,  Afib, MI, ventral hernia, CVA, DM, COPD, arhtritis, PVD, neuropathy.  Clinical Impression  Pt admitted as above and presenting with functional mobility limitations 2* generalized weakness and ambulatory balance deficits.  Pt plans dc home with assist of dtr.    Follow Up Recommendations Home health PT;Supervision/Assistance - 24 hour    Equipment Recommendations  None recommended by PT    Recommendations for Other Services       Precautions / Restrictions Precautions Precautions: Fall Restrictions Weight Bearing Restrictions: No      Mobility  Bed Mobility               General bed mobility comments: NT - OOB with nursing and requests up in chair to eat late breakfast  Transfers Overall transfer level: Needs assistance Equipment used: Rolling walker (2 wheeled) Transfers: Sit to/from Stand Sit to Stand: Min guard;Min assist Stand pivot transfers: Min assist       General transfer comment: Cues for LE management and use of UEs to self assist.  To/from recliner and to [facility]  Ambulation/Gait Ambulation/Gait assistance: Min guard Ambulation Distance (Feet): 200 Feet Assistive device: Rolling walker (2 wheeled) Gait Pattern/deviations: Step-through pattern;Decreased step length - right;Decreased step length - left;Shuffle;Trunk flexed     General Gait Details: cues for position of RW, cadence, and posture  Stairs            Wheelchair Mobility    Modified Rankin (Stroke Patients Only)       Balance     Sitting balance-Leahy Scale: Good       Standing balance-Leahy Scale: Fair                               Pertinent Vitals/Pain Pain Assessment: No/denies  pain    Home Living Family/patient expects to be discharged to:: Private residence Living Arrangements: Children Available Help at Discharge: Family;Available 24 hours/day Type of Home: House Home Access: Ramped entrance     Home Layout: One level Home Equipment: Walker - 4 wheels;Cane - single point;Shower seat;Hand held Careers information officer - 2 wheels Additional Comments: Lives w/ daughter, son in Social worker, and grandson    Prior Function Level of Independence: Independent with assistive device(s)         Comments: Uses rollator during the day and RW at night.       Hand Dominance   Dominant Hand: Right    Extremity/Trunk Assessment   Upper Extremity Assessment: Generalized weakness           Lower Extremity Assessment: Generalized weakness      Cervical / Trunk Assessment: Kyphotic  Communication   Communication: No difficulties  Cognition Arousal/Alertness: Awake/alert Behavior During Therapy: WFL for tasks assessed/performed Overall Cognitive Status: Within Functional Limits for tasks assessed                      General Comments      Exercises        Assessment/Plan    PT Assessment Patient needs continued PT services  PT Diagnosis Difficulty walking;Generalized weakness   PT Problem List Decreased strength;Decreased activity tolerance;Decreased balance;Decreased mobility;Decreased knowledge of use of DME  PT  Treatment Interventions DME instruction;Gait training;Functional mobility training;Therapeutic activities;Patient/family education;Balance training;Therapeutic exercise   PT Goals (Current goals can be found in the Care Plan section) Acute Rehab PT Goals Patient Stated Goal: to go home PT Goal Formulation: With patient Time For Goal Achievement: 09/13/15 Potential to Achieve Goals: Good    Frequency Min 3X/week   Barriers to discharge        Co-evaluation               End of Session Equipment Utilized During Treatment:  Gait belt Activity Tolerance: Patient tolerated treatment well Patient left: in chair;with call bell/phone within reach;with family/visitor present Nurse Communication: Mobility status         Time: 1025-1057 PT Time Calculation (min) (ACUTE ONLY): 32 min   Charges:   PT Evaluation $PT Eval Low Complexity: 1 Procedure PT Treatments $Gait Training: 8-22 mins   PT G Codes:        Deannah Rossi 09-28-2015, 12:28 PM

## 2015-09-17 NOTE — Progress Notes (Signed)
Pt complaining of nausea, zofran given.  Belly felt firm upon assessment.  PA paged, will continue to monitor closely and follow out any new orders.

## 2015-09-17 NOTE — Progress Notes (Signed)
Pt reports vomiting and abdominal distention, abdomen feels firm to touch. GI on-call paged, given orders to place pt back on NPO diet and replace NG tube. Will carry out orders and continue to monitor.

## 2015-09-18 LAB — BASIC METABOLIC PANEL
Anion gap: 6 (ref 5–15)
BUN: 5 mg/dL — ABNORMAL LOW (ref 6–20)
CO2: 23 mmol/L (ref 22–32)
Calcium: 8.5 mg/dL — ABNORMAL LOW (ref 8.9–10.3)
Chloride: 109 mmol/L (ref 101–111)
Creatinine, Ser: 0.99 mg/dL (ref 0.44–1.00)
GFR calc Af Amer: 56 mL/min — ABNORMAL LOW (ref >60)
GFR calc non Af Amer: 49 mL/min — ABNORMAL LOW (ref >60)
Glucose, Bld: 61 mg/dL — ABNORMAL LOW (ref 65–99)
Potassium: 3.6 mmol/L (ref 3.5–5.1)
Sodium: 138 mmol/L (ref 135–145)

## 2015-09-18 LAB — CBC
HCT: 27.2 % — ABNORMAL LOW (ref 36.0–46.0)
Hemoglobin: 9.6 g/dL — ABNORMAL LOW (ref 12.0–15.0)
MCH: 33.4 pg (ref 26.0–34.0)
MCHC: 35.3 g/dL (ref 30.0–36.0)
MCV: 94.8 fL (ref 78.0–100.0)
Platelets: 302 10*3/uL (ref 150–400)
RBC: 2.87 MIL/uL — ABNORMAL LOW (ref 3.87–5.11)
RDW: 17.9 % — ABNORMAL HIGH (ref 11.5–15.5)
WBC: 11.3 10*3/uL — ABNORMAL HIGH (ref 4.0–10.5)

## 2015-09-18 LAB — MAGNESIUM: Magnesium: 1.8 mg/dL (ref 1.7–2.4)

## 2015-09-18 MED ORDER — LEVOTHYROXINE SODIUM 100 MCG IV SOLR
44.0000 ug | Freq: Every day | INTRAVENOUS | Status: DC
  Administered 2015-09-19 – 2015-09-27 (×8): 44 ug via INTRAVENOUS
  Filled 2015-09-18 (×9): qty 5

## 2015-09-18 MED ORDER — METOCLOPRAMIDE HCL 5 MG/ML IJ SOLN
5.0000 mg | Freq: Three times a day (TID) | INTRAMUSCULAR | Status: DC
  Administered 2015-09-18 – 2015-09-29 (×34): 5 mg via INTRAVENOUS
  Filled 2015-09-18 (×34): qty 2

## 2015-09-18 MED ORDER — HEPARIN (PORCINE) IN NACL 100-0.45 UNIT/ML-% IJ SOLN
650.0000 [IU]/h | INTRAMUSCULAR | Status: DC
  Administered 2015-09-18 – 2015-09-21 (×3): 650 [IU]/h via INTRAVENOUS
  Filled 2015-09-18 (×3): qty 250

## 2015-09-18 MED ORDER — IPRATROPIUM BROMIDE 0.02 % IN SOLN
RESPIRATORY_TRACT | Status: AC
  Filled 2015-09-18: qty 2.5

## 2015-09-18 MED ORDER — LIDOCAINE VISCOUS 2 % MT SOLN
15.0000 mL | OROMUCOSAL | Status: DC | PRN
  Administered 2015-09-18 – 2015-09-21 (×2): 15 mL via OROMUCOSAL
  Filled 2015-09-18 (×4): qty 15

## 2015-09-18 MED ORDER — METOCLOPRAMIDE HCL 10 MG PO TABS
5.0000 mg | ORAL_TABLET | Freq: Three times a day (TID) | ORAL | Status: DC
  Administered 2015-09-18: 5 mg via ORAL
  Filled 2015-09-18: qty 1

## 2015-09-18 NOTE — Progress Notes (Signed)
Physical Therapy Treatment Patient Details Name: Anayra Stalling MRN: [id number] DOB: 1923-06-18 Today's Date: 09/18/2015    History of Present Illness 82 yo female admitted with HCAP and Gastic outlet obstruction 2* pyloric stenosis. Hx of HTN, Osteomyelitis of 3rd toe on R foot,  Afib, MI, ventral hernia, CVA, DM, COPD, arhtritis, PVD, neuropathy.    PT Comments    Pt cooperative but ltd this date 2* ongoing nausea  Follow Up Recommendations  Home health PT;Supervision/Assistance - 24 hour     Equipment Recommendations  None recommended by PT    Recommendations for Other Services       Precautions / Restrictions Precautions Precautions: Fall Restrictions Weight Bearing Restrictions: No    Mobility  Bed Mobility Overal bed mobility: Needs Assistance Bed Mobility: Supine to Sit     Supine to sit: Min guard;Min assist     General bed mobility comments: Increased time and pt utilizing bed rail to self assist  Transfers Overall transfer level: Needs assistance Equipment used: Rolling walker (2 wheeled) Transfers: Sit to/from Stand Sit to Stand: Min assist         General transfer comment: Cues for LE management and use of UEs to self assist.  To/from recliner and to [facility]  Ambulation/Gait Ambulation/Gait assistance: Min assist Ambulation Distance (Feet): 3 Feet (twice.  To BSC and from [facility] to chair) Assistive device: Rolling walker (2 wheeled) Gait Pattern/deviations: Step-to pattern;Decreased step length - right;Decreased step length - left;Shuffle;Trunk flexed Gait velocity: decr   General Gait Details: cues for posture and position from RW.  Distance ltd 2* nausea   Stairs            Wheelchair Mobility    Modified Rankin (Stroke Patients Only)       Balance     Sitting balance-Leahy Scale: Good       Standing balance-Leahy Scale: Fair                      Cognition Arousal/Alertness: Awake/alert Behavior During Therapy: WFL for  tasks assessed/performed Overall Cognitive Status: Within Functional Limits for tasks assessed                      Exercises      General Comments        Pertinent Vitals/Pain Pain Assessment: No/denies pain    Home Living                      Prior Function            PT Goals (current goals can now be found in the care plan section) Acute Rehab PT Goals Patient Stated Goal: to go home PT Goal Formulation: With patient Time For Goal Achievement: 09/13/15 Potential to Achieve Goals: Good Progress towards PT goals: Progressing toward goals    Frequency  Min 3X/week    PT Plan Current plan remains appropriate    Co-evaluation             End of Session Equipment Utilized During Treatment: Gait belt Activity Tolerance: Patient limited by fatigue;Other (comment) (nausea) Patient left: in chair;with call bell/phone within reach;with nursing/sitter in room     Time: 1025-1059 PT Time Calculation (min) (ACUTE ONLY): 34 min  Charges:  $Gait Training: 8-22 mins $Therapeutic Activity: 8-22 mins                    G Codes:  Mikalyn Hermida 09/18/2015, 12:32 PM

## 2015-09-18 NOTE — Progress Notes (Signed)
[place] for Apixabab --> heparin Indication: Atrial Fibrillation  No Known Allergies  Patient Measurements: Height: 5\' 6"  (167.6 cm) Weight: 147 lb (66.679 kg) IBW/kg (Calculated) : 59.3 Heparin Dosing Weight: 66kg  Vital Signs: Temp: 99 F (37.2 C) (06/17 0445) Temp Source: Oral (06/17 0445) BP: 154/64 mmHg (06/17 0445) Pulse Rate: 86 (06/17 0445)  Labs:  Recent Labs  09/16/15 0322 09/17/15 0415 09/18/15 0437  HGB 8.8* 8.8* 9.6*  HCT 25.4* 25.4* 27.2*  PLT 281 268 302  APTT 37  --   --   HEPARINUNFRC 0.73*  --   --   CREATININE 1.01* 1.04* 0.99    Estimated Creatinine Clearance: 35.4 mL/min (by C-G formula based on Cr of 0.99).   Medical History: Past Medical History  Diagnosis Date  . Pneumonia   . Hypertension   . Small bowel obstruction ([place])     "I don't know how many" (01/11/2015)  . Ventral hernia with bowel obstruction   . Asthma   . Hyperlipidemia   . Stroke ([place])     "light one"  . COPD (chronic obstructive pulmonary disease) ([place])   . Hypothyroidism   . Type II diabetes mellitus ([place])   . Anemia     previous blood transfusions  . GERD (gastroesophageal reflux disease)   . History of stomach ulcers   . Arthritis     "all over"  . Renal insufficiency   . Depression     "light case"  . SIADH (syndrome of inappropriate ADH production) ([place])     [name] 01/10/2015  . Perforated gastric ulcer ([place])   . Gastric stenosis   . Paraesophageal hernia   . Bradycardia     topped BB and amiodarone decreased- junctional rhythm    Assessment: 89 yoF on chronic apixaban for hx atrial fibrillation hospitalized for GOO, transitioned prior to admission apixaban to IV heparin at admission for EGD on 6/14.  She required dilation of moderate stenosis and suction of retained fluid and small food fragments. GI ok with resuming anticoagulation 6/15.  Patient initially tolerated diet, but again having abd distention and  nausea 6/17.  NGT placed 6/16pm with 1772ml out.Pharmacy consulted to resume heparin gtt again.    PTA apixaban 2.5mg  PO BID  09/18/2015  CBC: Hgb low.stable,pltc WNL  Last dose of apixaban was charted at 09:12 6/17, since being resumed 6/15am only missed 6/16hs dose  Patient previously therapeutic on heparin 650 units/hr  Goal of Therapy:  Monitor platelets by anticoagulation protocol: Yes  Heparin level 0.3-0.6 APTT 66-102 sec   Plan:   At 21:00 tonight (12h from last apixaban dose) start heparin at 650units/hr  8h heparin level and aPTT  Daily aPTT, heparin level, CBC  Monitor for bleeding  Await long-term plans (? Feeding tube)  Doreene Eland, PharmD, BCPS.   Pager: [phone] 09/18/2015 1:01 PM

## 2015-09-18 NOTE — Progress Notes (Signed)
Pt put out 1750 cc from NG tube. Abdomen feels less firm this am, pt reports feeling relief from tube placement.

## 2015-09-18 NOTE — Progress Notes (Signed)
[facility] Progress Note  Patient: Shannon [name] WUJ:[id number]   PCP: Kirt Boys, DO DOB: 11-Jul-1923   DOA: 09/11/2015   DOS: 09/18/2015   Date of Service: the patient was seen and examined on 09/18/2015  Subjective: Overnight the patient had reoccurrence of abdominal bloating as well as nausea and vomiting. NG tube was inserted after discussion with gastroenterology. 1500 mL output was noted immediately.  Nutrition: Remains nothing by mouth  Brief hospital course: Pt. with PMH of HTN, dyslipidemia, hypothyroidism, PUD, ischemic toe with osteomyelitis, chronic gastric distention; admitted on 09/11/2015, with complaint of abdominal pain, was found to have pyloric stenosis, status post dilatation after EGD and improving better. Also had healthcare associated pneumonia which is treated and improving. Currently further plan is continue IV antibiotics, and plan further workup and treatment for pyloric stenosis.  Assessment and Plan: 1. HCAP - likely aspiration, from gastric outlet obstruction, appears nontoxic currently afebrile, Was treated with vancomycin and cefepime, cultures negative now on Unasyn. Transition to oral when patient is able to take orally comfortably.  2. Gastric outlet obstruction due to pyloric stenosis. Initially there was improvement after EGD and dilatation but still not back to baseline. On 09/18/2015 patient has 3 insertion of NG tube due to abdominal distention with significant output. As per GI patient may need PEJ tube placement if she is unable to tolerate oral diet. Was discussed with patient's as well as her daughter. They would like to think about it as the patient does not feel having a feeding tube. IR will need to be consulted if the patient and family decided to go for feeding tube. Recommended comfort care if the patient is not willing to undergo feeding tube placement.  3. Right third toe gangrene. On antibiotics, refused amputation last admission,  again refuses, confirmed with her daughter. Suppressive antibiotic course ongoing for 30 days. After antibiotic course she wants to continue with wound care and supportive care. If worsening then daughter requested to consider comfort care. Last day antibiotics 10/07/2015.  4. Dyslipidemia. On statin.  5. Depression. On Zoloft.  6. Hypothyroidism recently Synthroid increased to 88 g few days ago, repeat TSH in 4 weeks from now. Change to IV Synthroid at present.  7. GERD. On PPI  8. Paroxysmal atrial fibrillation with RVR [place] vasc 2 score of 5. Continue amiodarone and transitioned to IV heparin again in the setting of patient remaining nothing by mouth.  9. History of asthma COPD. Stable.  10. Hypokalemia. Replaced.  Pain management: When necessary Tylenol Activity: Consulted physical therapy home health PT recommended at present. Bowel regimen: last BM 09/16/2015 Diet: Nothing by mouth DVT Prophylaxis: on therapeutic anticoagulation.  Advance goals of care discussion: Partial, no CPR, no intubation, yes to defibrillation  Family Communication: no family was present at bedside, at the time of interview.   Disposition:  Discharge to likely home with home health versus SNF. Expected discharge date: 09/20/2015, pending tolerance of soft diet  Consultants: Gastroenterology Procedures: EGD with dilatation of pyloric stenosis  Antibiotics: Anti-infectives    Start     Dose/Rate Route Frequency Ordered Stop   09/16/15 1200  erythromycin 250 mg in sodium chloride 0.9 % 100 mL IVPB  Status:  Discontinued     250 mg 100 mL/hr over 60 Minutes Intravenous Every 6 hours 09/16/15 0846 09/17/15 1002   09/15/15 1800  ampicillin-sulbactam (UNASYN) 1.5 g in sodium chloride 0.9 % 50 mL IVPB     1.5 g 100 mL/hr over 30 Minutes Intravenous  Every 6 hours 09/15/15 1103     09/15/15 1715  erythromycin (E-MYCIN) tablet 250 mg  Status:  Discontinued     250 mg Oral 3 times daily before meals &  bedtime 09/15/15 1656 09/16/15 0846   09/13/15 0600  vancomycin (VANCOCIN) IVPB 750 mg/150 ml premix  Status:  Discontinued     750 mg 150 mL/hr over 60 Minutes Intravenous Every 24 hours 09/12/15 0546 09/15/15 1036   09/12/15 2200  ceFEPIme (MAXIPIME) 1 g in dextrose 5 % 50 mL IVPB  Status:  Discontinued     1 g 100 mL/hr over 30 Minutes Intravenous Daily at bedtime 09/12/15 0410 09/15/15 1036   09/12/15 0430  ceFEPIme (MAXIPIME) 1 g in dextrose 5 % 50 mL IVPB     1 g 100 mL/hr over 30 Minutes Intravenous  Once 09/12/15 0427 09/12/15 0540   09/12/15 0100  vancomycin (VANCOCIN) IVPB 1000 mg/200 mL premix     1,000 mg 200 mL/hr over 60 Minutes Intravenous  Once 09/12/15 0046 09/12/15 0400   09/12/15 0100  piperacillin-tazobactam (ZOSYN) IVPB 3.375 g     3.375 g 100 mL/hr over 30 Minutes Intravenous  Once 09/12/15 0046 09/12/15 0222        Intake/Output Summary (Last 24 hours) at 09/18/15 1804 Last data filed at 09/18/15 1500  Gross per 24 hour  Intake    250 ml  Output   2900 ml  Net  -2650 ml   Filed Weights   09/11/15 2303 09/12/15 0241 09/15/15 1412  Weight: 68.04 kg (150 lb) 66.724 kg (147 lb 1.6 oz) 66.679 kg (147 lb)    Objective: Physical Exam: Filed Vitals:   09/17/15 2048 09/17/15 2111 09/18/15 0445 09/18/15 1532  BP: 156/84  154/64 173/56  Pulse: 74  86 63  Temp: 98.6 F (37 C)  99 F (37.2 C) 97.4 F (36.3 C)  TempSrc: Oral  Oral Oral  Resp: 18  18   Height:      Weight:      SpO2: 95% 94% 100% 98%    General: Alert, Awake and Oriented to Time, Place and Person. Appear in mild distress, NG tube reinserted Eyes: PERRL, Conjunctiva normal ENT: Oral Mucosa clear moist. Neck: no JVD, no Abnormal Mass Or lumps Cardiovascular: S1 and S2 Present, aortic systolic Murmur, Respiratory: Bilateral Air entry equal and Decreased, Clear to Auscultation, no Crackles, no wheezes Abdomen: Bowel Sound present, Soft and no tenderness Extremities: no Pedal edema, no calf  tenderness Neurologic: Grossly no focal neuro deficit. Bilaterally Equal motor strength  Data Reviewed: CBC:  Recent Labs Lab 09/11/15 2351  09/14/15 0435 09/15/15 0456 09/16/15 0322 09/17/15 0415 09/18/15 0437  WBC 18.7*  < > 15.7* 14.4* 10.6* 10.8* 11.3*  NEUTROABS 8.7*  --   --   --   --  2.8  --   HGB 10.8*  < > 9.4* 10.0* 8.8* 8.8* 9.6*  HCT 31.1*  < > 27.7* 29.2* 25.4* 25.4* 27.2*  MCV 96.6  < > 95.2 94.5 95.1 93.0 94.8  PLT 335  < > 306 318 281 268 302  < > = values in this interval not displayed. Basic Metabolic Panel:  Recent Labs Lab 09/14/15 0435 09/15/15 0456 09/16/15 0322 09/17/15 0415 09/18/15 0437  NA 141 140 137 136 138  K 3.6 4.0 3.5 3.7 3.6  CL 112* 112* 110 109 109  CO2 21* 17* 22 22 23   GLUCOSE 56* 60* 83 81 61*  BUN 13 10 7  <  5* 5*  CREATININE 1.22* 1.16* 1.01* 1.04* 0.99  CALCIUM 8.5* 8.4* 8.0* 8.2* 8.5*  MG 1.6*  --  1.7 1.5* 1.8  PHOS  --   --  1.8*  --   --     Liver Function Tests:  Recent Labs Lab 09/11/15 2351 09/17/15 0415  AST 43* 23  ALT 24 16  ALKPHOS 59 40  BILITOT 1.2 0.6  PROT 7.6 5.8*  ALBUMIN 3.7 2.5*    Recent Labs Lab 09/11/15 2351  LIPASE 81*   No results for input(s): AMMONIA in the last 168 hours. Coagulation Profile:  Recent Labs Lab 09/13/15 0848  INR 1.37   Cardiac Enzymes: No results for input(s): CKTOTAL, CKMB, CKMBINDEX, TROPONINI in the last 168 hours. BNP (last 3 results) No results for input(s): PROBNP in the last 8760 hours.  CBG:  Recent Labs Lab 09/15/15 0735 09/15/15 0915 09/15/15 1805 09/17/15 0025  GLUCAP 55* 111* 117* 93    Studies: No results found.   Scheduled Meds: . amiodarone  100 mg Oral Daily  . ampicillin-sulbactam (UNASYN) IV  1.5 g Intravenous Q6H  . carvedilol  3.125 mg Oral BID WC  . chlorhexidine  15 mL Mouth/Throat BID  . cycloSPORINE  1 drop Both Eyes BID  . fluticasone  2 spray Each Nare Daily  . gabapentin  300 mg Oral Daily  . gabapentin  600 mg  Oral 2 times per day  . latanoprost  1 drop Both Eyes QHS  . [START ON 09/19/2015] levothyroxine  44 mcg Intravenous Daily  . metoCLOPramide (REGLAN) injection  5 mg Intravenous Q8H  . mometasone-formoterol  2 puff Inhalation BID  . nitroGLYCERIN  0.5 inch Topical Q6H  . pantoprazole  40 mg Oral Daily  . polyvinyl alcohol  1 drop Both Eyes QID  . sertraline  100 mg Oral Daily   Continuous Infusions: . heparin     PRN Meds: acetaminophen, albuterol, bisacodyl, diclofenac sodium, hydrALAZINE, lidocaine, metoprolol, ondansetron (ZOFRAN) IV, phenol  Time spent: 30 minutes  Author: Lynden Oxford, MD [facility] Pager: [phone] 09/18/2015 6:04 PM  If 7PM-7AM, please contact night-coverage at [email or web address], password [facility]

## 2015-09-18 NOTE — Progress Notes (Signed)
CROSS COVER [facility] Subjective: 82 year old female with multiple medical problems including  hypothyroidism, PUD, ischemic toe with osteomyelitis, chronic gastric distention; admitted on 09/11/2015, with complaint of abdominal pain, was found to have pyloric stenosis; she is, status post dilatation of the pylorus after EGD and was improving slowly but had recurrent symptoms once her diet was advanced to solids. So she was made NPO and over 1500cc's was removed by NG suction last night.   Objective: Vital signs in last 24 hours: Temp:  [98.6 F (37 C)-99 F (37.2 C)] 99 F (37.2 C) (06/17 0445) Pulse Rate:  [74-87] 86 (06/17 0445) Resp:  [18-20] 18 (06/17 0445) BP: (136-156)/(64-84) 154/64 mmHg (06/17 0445) SpO2:  [94 %-100 %] 100 % (06/17 0445) Last BM Date: 09/17/15  Intake/Output from previous day: 06/16 0701 - 06/17 0700 In: 740 [P.O.:540; IV Piggyback:200] Out: 2725 [Urine:975; Emesis/NG output:1750] Intake/Output this shift:   General appearance: alert, cooperative, appears stated age, fatigued and no distress; NGT is in place .Resp: clear to auscultation bilaterally Cardio: regular rate and rhythm, S1, S2 normal, no murmur, click, rub or gallop GI: soft, non-tender; bowel sounds normal; no masses,  no organomegaly  Lab Results:  Recent Labs  09/16/15 0322 09/17/15 0415 09/18/15 0437  WBC 10.6* 10.8* 11.3*  HGB 8.8* 8.8* 9.6*  HCT 25.4* 25.4* 27.2*  PLT 281 268 302   BMET  Recent Labs  09/16/15 0322 09/17/15 0415 09/18/15 0437  NA 137 136 138  K 3.5 3.7 3.6  CL 110 109 109  CO2 22 22 23   GLUCOSE 83 81 61*  BUN 7 <5* 5*  CREATININE 1.01* 1.04* 0.99  CALCIUM 8.0* 8.2* 8.5*   LFT  Recent Labs  09/17/15 0415  PROT 5.8*  ALBUMIN 2.5*  AST 23  ALT 16  ALKPHOS 40  BILITOT 0.6   Medications: I have reviewed the patient's current medications.  Assessment/Plan: GOO with feeding difficulties/PCM; agree with Dr. Doyne Keel plan for a feeding tube  placement next week.  LOS: 6 days   Shannon [name] 09/18/2015, 9:29 AM

## 2015-09-19 DIAGNOSIS — E1151 Type 2 diabetes mellitus with diabetic peripheral angiopathy without gangrene: Secondary | ICD-10-CM

## 2015-09-19 DIAGNOSIS — E1165 Type 2 diabetes mellitus with hyperglycemia: Secondary | ICD-10-CM

## 2015-09-19 LAB — CBC WITH DIFFERENTIAL/PLATELET
Basophils Absolute: 0 10*3/uL (ref 0.0–0.1)
Basophils Relative: 0 %
Eosinophils Absolute: 6 10*3/uL — ABNORMAL HIGH (ref 0.0–0.7)
Eosinophils Relative: 55 %
HCT: 29.3 % — ABNORMAL LOW (ref 36.0–46.0)
Hemoglobin: 10.3 g/dL — ABNORMAL LOW (ref 12.0–15.0)
Lymphocytes Relative: 12 %
Lymphs Abs: 1.3 10*3/uL (ref 0.7–4.0)
MCH: 32.9 pg (ref 26.0–34.0)
MCHC: 35.2 g/dL (ref 30.0–36.0)
MCV: 93.6 fL (ref 78.0–100.0)
Monocytes Absolute: 0.7 10*3/uL (ref 0.1–1.0)
Monocytes Relative: 6 %
Neutro Abs: 2.9 10*3/uL (ref 1.7–7.7)
Neutrophils Relative %: 27 %
Platelets: 327 10*3/uL (ref 150–400)
RBC: 3.13 MIL/uL — ABNORMAL LOW (ref 3.87–5.11)
RDW: 17.7 % — ABNORMAL HIGH (ref 11.5–15.5)
WBC: 10.9 10*3/uL — ABNORMAL HIGH (ref 4.0–10.5)

## 2015-09-19 LAB — HEPARIN LEVEL (UNFRACTIONATED)
Heparin Unfractionated: 0.4 IU/mL (ref 0.30–0.70)
Heparin Unfractionated: 0.51 IU/mL (ref 0.30–0.70)

## 2015-09-19 LAB — APTT: aPTT: 64 seconds — ABNORMAL HIGH (ref 24–37)

## 2015-09-19 LAB — MAGNESIUM: Magnesium: 1.6 mg/dL — ABNORMAL LOW (ref 1.7–2.4)

## 2015-09-19 MED ORDER — LIP MEDEX EX OINT
TOPICAL_OINTMENT | CUTANEOUS | Status: DC | PRN
  Filled 2015-09-19: qty 7

## 2015-09-19 MED ORDER — MAGNESIUM SULFATE 2 GM/50ML IV SOLN
2.0000 g | Freq: Once | INTRAVENOUS | Status: AC
  Administered 2015-09-19: 2 g via INTRAVENOUS
  Filled 2015-09-19: qty 50

## 2015-09-19 NOTE — Progress Notes (Signed)
[facility] Progress Note  Patient: Shannon [name] ZHY:[id number]   PCP: Kirt Boys, DO DOB: 10/30/1923   DOA: 09/11/2015   DOS: 09/19/2015   Date of Service: the patient was seen and examined on 09/19/2015  Subjective: Patient denies any acute complaint. The sore throat after NG tube insertion is resolved we did she denies any nausea or abdominal pain. She did have one bowel movement. Nutrition: Remains nothing by mouth  Brief hospital course: Pt. with PMH of HTN, dyslipidemia, hypothyroidism, PUD, ischemic toe with osteomyelitis, chronic gastric distention; admitted on 09/11/2015, with complaint of abdominal pain, was found to have pyloric stenosis, status post dilatation after EGD and improving better. Also had healthcare associated pneumonia which is treated and improving. Currently further plan is continue IV antibiotics, and get postpyloric feeding tube for patient on Monday  Assessment and Plan: 1. HCAP - likely aspiration, from gastric outlet obstruction, appears nontoxic currently afebrile, Was treated with vancomycin and cefepime, cultures negative now on Unasyn. Transition to oral when patient is able to take orally comfortably.  2. Gastric outlet obstruction due to pyloric stenosis. Initially there was improvement after EGD and dilatation but still not back to baseline. On 09/18/2015 patient has re-insertion of NG tube due to abdominal distention with significant output. As per GI patient may need postpyloric feeding tube placement if she is unable to tolerate oral diet. Was discussed with patient's as well as her daughter. IR consulted.  3. Right third toe gangrene. On Augmentin as an outpatient and Unasyn in the hospital, refused amputation last admission, again refuses, confirmed with her daughter. Suppressive antibiotic course ongoing for 30 days. After antibiotic course she wants to continue with wound care and supportive care. If worsening then daughter requested to  consider comfort care. Last day antibiotics 10/07/2015.  4. Dyslipidemia. On statin.  5. Depression. On Zoloft.  6. Hypothyroidism recently Synthroid increased to 88 g few days ago, repeat TSH in 4 weeks from now. Change to IV Synthroid at present.  7. GERD. On PPI  8. Paroxysmal atrial fibrillation with RVR [place] vasc 2 score of 5. Continue amiodarone and transitioned to IV heparin again in the setting of patient remaining nothing by mouth.  9. History of asthma COPD. Stable.  10. Hypokalemia. Replaced.  Pain management: When necessary Tylenol Activity: Consulted physical therapy home health PT recommended at present. Bowel regimen: last BM 09/17/2015 Diet: Nothing by mouth DVT Prophylaxis: on therapeutic anticoagulation.  Advance goals of care discussion: Partial, no CPR, no intubation, yes to defibrillation  Family Communication: no family was present at bedside, at the time of interview.   Disposition:  Discharge to likely home with home health versus SNF. Expected discharge date: 09/20/2015, pending tolerance of soft diet  Consultants: Gastroenterology Procedures: EGD with dilatation of pyloric stenosis  Antibiotics: Anti-infectives    Start     Dose/Rate Route Frequency Ordered Stop   09/16/15 1200  erythromycin 250 mg in sodium chloride 0.9 % 100 mL IVPB  Status:  Discontinued     250 mg 100 mL/hr over 60 Minutes Intravenous Every 6 hours 09/16/15 0846 09/17/15 1002   09/15/15 1800  ampicillin-sulbactam (UNASYN) 1.5 g in sodium chloride 0.9 % 50 mL IVPB     1.5 g 100 mL/hr over 30 Minutes Intravenous Every 6 hours 09/15/15 1103     09/15/15 1715  erythromycin (E-MYCIN) tablet 250 mg  Status:  Discontinued     250 mg Oral 3 times daily before meals & bedtime 09/15/15 1656 09/16/15  1610   09/13/15 0600  vancomycin (VANCOCIN) IVPB 750 mg/150 ml premix  Status:  Discontinued     750 mg 150 mL/hr over 60 Minutes Intravenous Every 24 hours 09/12/15 0546 09/15/15 1036     09/12/15 2200  ceFEPIme (MAXIPIME) 1 g in dextrose 5 % 50 mL IVPB  Status:  Discontinued     1 g 100 mL/hr over 30 Minutes Intravenous Daily at bedtime 09/12/15 0410 09/15/15 1036   09/12/15 0430  ceFEPIme (MAXIPIME) 1 g in dextrose 5 % 50 mL IVPB     1 g 100 mL/hr over 30 Minutes Intravenous  Once 09/12/15 0427 09/12/15 0540   09/12/15 0100  vancomycin (VANCOCIN) IVPB 1000 mg/200 mL premix     1,000 mg 200 mL/hr over 60 Minutes Intravenous  Once 09/12/15 0046 09/12/15 0400   09/12/15 0100  piperacillin-tazobactam (ZOSYN) IVPB 3.375 g     3.375 g 100 mL/hr over 30 Minutes Intravenous  Once 09/12/15 0046 09/12/15 0222        Intake/Output Summary (Last 24 hours) at 09/19/15 1932 Last data filed at 09/19/15 1900  Gross per 24 hour  Intake 485.85 ml  Output    450 ml  Net  35.85 ml   Filed Weights   09/11/15 2303 09/12/15 0241 09/15/15 1412  Weight: 68.04 kg (150 lb) 66.724 kg (147 lb 1.6 oz) 66.679 kg (147 lb)    Objective: Physical Exam: Filed Vitals:   09/19/15 0504 09/19/15 0806 09/19/15 1412 09/19/15 1900  BP: 169/77 166/58 190/91 176/77  Pulse: 73 101 98 71  Temp: 98.4 F (36.9 C) 97.7 F (36.5 C) 97.6 F (36.4 C)   TempSrc: Oral Oral Oral   Resp: 18  20   Height:      Weight:      SpO2: 100% 99% 98%     General: Alert, Awake and Oriented to Time, Place and Person. Appear in mild distress, NG tube reinserted Eyes: PERRL, Conjunctiva normal ENT: Oral Mucosa clear moist. Neck: no JVD, no Abnormal Mass Or lumps Cardiovascular: S1 and S2 Present, aortic systolic Murmur, Respiratory: Bilateral Air entry equal and Decreased, Clear to Auscultation, no Crackles, no wheezes Abdomen: Bowel Sound present, Soft and no tenderness Extremities: no Pedal edema, no calf tenderness Neurologic: Grossly no focal neuro deficit. Bilaterally Equal motor strength  Data Reviewed: CBC:  Recent Labs Lab 09/15/15 0456 09/16/15 0322 09/17/15 0415 09/18/15 0437 09/19/15 0418   WBC 14.4* 10.6* 10.8* 11.3* 10.9*  NEUTROABS  --   --  2.8  --  2.9  HGB 10.0* 8.8* 8.8* 9.6* 10.3*  HCT 29.2* 25.4* 25.4* 27.2* 29.3*  MCV 94.5 95.1 93.0 94.8 93.6  PLT 318 281 268 302 327   Basic Metabolic Panel:  Recent Labs Lab 09/14/15 0435 09/15/15 0456 09/16/15 0322 09/17/15 0415 09/18/15 0437 09/19/15 0418  NA 141 140 137 136 138  --   K 3.6 4.0 3.5 3.7 3.6  --   CL 112* 112* 110 109 109  --   CO2 21* 17* 22 22 23   --   GLUCOSE 56* 60* 83 81 61*  --   BUN 13 10 7  <5* 5*  --   CREATININE 1.22* 1.16* 1.01* 1.04* 0.99  --   CALCIUM 8.5* 8.4* 8.0* 8.2* 8.5*  --   MG 1.6*  --  1.7 1.5* 1.8 1.6*  PHOS  --   --  1.8*  --   --   --     Liver Function Tests:  Recent Labs Lab 09/17/15 0415  AST 23  ALT 16  ALKPHOS 40  BILITOT 0.6  PROT 5.8*  ALBUMIN 2.5*   No results for input(s): LIPASE, AMYLASE in the last 168 hours. No results for input(s): AMMONIA in the last 168 hours. Coagulation Profile:  Recent Labs Lab 09/13/15 0848  INR 1.37   Cardiac Enzymes: No results for input(s): CKTOTAL, CKMB, CKMBINDEX, TROPONINI in the last 168 hours. BNP (last 3 results) No results for input(s): PROBNP in the last 8760 hours.  CBG:  Recent Labs Lab 09/15/15 0735 09/15/15 0915 09/15/15 1805 09/17/15 0025  GLUCAP 55* 111* 117* 93    Studies: No results found.   Scheduled Meds: . amiodarone  100 mg Oral Daily  . ampicillin-sulbactam (UNASYN) IV  1.5 g Intravenous Q6H  . carvedilol  3.125 mg Oral BID WC  . chlorhexidine  15 mL Mouth/Throat BID  . cycloSPORINE  1 drop Both Eyes BID  . fluticasone  2 spray Each Nare Daily  . gabapentin  300 mg Oral Daily  . gabapentin  600 mg Oral 2 times per day  . latanoprost  1 drop Both Eyes QHS  . levothyroxine  44 mcg Intravenous Daily  . metoCLOPramide (REGLAN) injection  5 mg Intravenous Q8H  . mometasone-formoterol  2 puff Inhalation BID  . nitroGLYCERIN  0.5 inch Topical Q6H  . pantoprazole  40 mg Oral Daily  .  polyvinyl alcohol  1 drop Both Eyes QID  . sertraline  100 mg Oral Daily   Continuous Infusions: . heparin 650 Units/hr (09/18/15 2137)   PRN Meds: acetaminophen, albuterol, bisacodyl, diclofenac sodium, hydrALAZINE, lidocaine, lip balm, metoprolol, ondansetron (ZOFRAN) IV, phenol  Time spent: 30 minutes  Author: Lynden Oxford, MD [facility] Pager: [phone] 09/19/2015 7:32 PM  If 7PM-7AM, please contact night-coverage at [email or web address], password [facility]

## 2015-09-19 NOTE — Progress Notes (Signed)
Shannon [name] for Apixabab --> heparin Indication: Atrial Fibrillation  No Known Allergies  Patient Measurements: Height: 5\' 6"  (167.6 cm) Weight: 147 lb (66.679 kg) IBW/kg (Calculated) : 59.3 Heparin Dosing Weight: 66kg  Vital Signs: Temp: 98.4 F (36.9 C) (06/18 0504) Temp Source: Oral (06/18 0504) BP: 169/77 mmHg (06/18 0504) Pulse Rate: 73 (06/18 0504)  Labs:  Recent Labs  09/17/15 0415 09/18/15 0437 09/19/15 0418  HGB 8.8* 9.6* 10.3*  HCT 25.4* 27.2* 29.3*  PLT 268 302 327  APTT  --   --  64*  HEPARINUNFRC  --   --  0.40  CREATININE 1.04* 0.99  --     Estimated Creatinine Clearance: 35.4 mL/min (by C-G formula based on Cr of 0.99).   Medical History: Past Medical History  Diagnosis Date  . Pneumonia   . Hypertension   . Small bowel obstruction ([place])     "I don't know how many" (01/11/2015)  . Ventral hernia with bowel obstruction   . Asthma   . Hyperlipidemia   . Stroke ([place])     "light one"  . COPD (chronic obstructive pulmonary disease) ([place])   . Hypothyroidism   . Type II diabetes mellitus ([place])   . Anemia     previous blood transfusions  . GERD (gastroesophageal reflux disease)   . History of stomach ulcers   . Arthritis     "all over"  . Renal insufficiency   . Depression     "light case"  . SIADH (syndrome of inappropriate ADH production) ([place])     Shannon [name] 01/10/2015  . Perforated gastric ulcer ([place])   . Gastric stenosis   . Paraesophageal hernia   . Bradycardia     topped BB and amiodarone decreased- junctional rhythm    Assessment: 89 yoF on chronic apixaban for hx atrial fibrillation hospitalized for GOO, transitioned prior to admission apixaban to IV heparin at admission for EGD on 6/14.  She required dilation of moderate stenosis and suction of retained fluid and small food fragments. GI ok with resuming anticoagulation 6/15.  Patient initially tolerated diet, but again having abd distention and  nausea 6/17.  NGT placed 6/16pm with 1738ml out.Pharmacy consulted to resume heparin gtt again.    PTA apixaban 2.5mg  PO BID  09/19/2015  CBC: Hgb low.stable,pltc WNL  APTT = 64 sec & Heparin Level = 0.4 with Heparin @ 650 units/hr  No complications of therapy noted  Goal of Therapy:  Monitor platelets by anticoagulation protocol: Yes  Heparin level 0.3-0.6 APTT 66-102 sec   Plan:   Continue heparin at 650units/hr  Recheck heparin level in 8h to confirm therapeutic dose  Daily heparin level, CBC  Monitor for bleeding  Await long-term plans (? Feeding tube)  Shannon [name], PharmD  09/19/2015 6:02 AM

## 2015-09-19 NOTE — Progress Notes (Signed)
CROSS COVER [facility] Subjective: Since I last evaluated the patient, there has not been much change in her condition. Ad the NG tube is still in place. The NG output has decreased significantly since it was reinserted 2 days ago. She denies having any nausea vomiting or abdominal pain.  Objective: Vital signs in last 24 hours: Temp:  [97.4 F (36.3 C)-98.4 F (36.9 C)] 97.7 F (36.5 C) (06/18 0806) Pulse Rate:  [63-101] 101 (06/18 0806) Resp:  [18] 18 (06/18 0504) BP: (160-173)/(56-77) 166/58 mmHg (06/18 0806) SpO2:  [98 %-100 %] 99 % (06/18 0806) Last BM Date: 09/17/15  Intake/Output from previous day: 06/17 0701 - 06/18 0700 In: 261 [I.V.:61; IV Piggyback:200] Out: 1150 [Urine:650; Emesis/NG output:500] Intake/Output this shift:   General appearance: alert, cooperative, appears stated age, fatigued and no distress Resp: clear to auscultation bilaterally Cardio: regular rate and rhythm, S1, S2 normal, no murmur, click, rub or gallop GI: soft, non-tender; bowel sounds normal; no masses,  no organomegaly  Lab Results:  Recent Labs  09/17/15 0415 09/18/15 0437 09/19/15 0418  WBC 10.8* 11.3* 10.9*  HGB 8.8* 9.6* 10.3*  HCT 25.4* 27.2* 29.3*  PLT 268 302 327   BMET  Recent Labs  09/17/15 0415 09/18/15 0437  NA 136 138  K 3.7 3.6  CL 109 109  CO2 22 23  GLUCOSE 81 61*  BUN <5* 5*  CREATININE 1.04* 0.99  CALCIUM 8.2* 8.5*   LFT  Recent Labs  09/17/15 0415  PROT 5.8*  ALBUMIN 2.5*  AST 23  ALT 16  ALKPHOS 40  BILITOT 0.6   Medications: I have reviewed the patient's current medications.  Assessment/Plan: 1) Gastric outlet obstruction-currently being treated with NG decompression. Plans for feeding tube to be finalized tomorrow.  2) Anemia of chronic disease. 3) GERD.  4) Protein calorie malnutrition.  LOS: 7 days   Shannon [name] 09/19/2015, 9:44 AM

## 2015-09-19 NOTE — Anesthesia Postprocedure Evaluation (Signed)
Anesthesia Post Note  Patient: Shannon [name]  Procedure(s) Performed: Procedure(s) (LRB): ESOPHAGOGASTRODUODENOSCOPY (EGD) WITH PROPOFOL (N/A)  Patient location during evaluation: Endoscopy Anesthesia Type: MAC Level of consciousness: awake and alert Pain management: pain level controlled Vital Signs Assessment: post-procedure vital signs reviewed and stable Respiratory status: spontaneous breathing, nonlabored ventilation, respiratory function stable and patient connected to nasal cannula oxygen Cardiovascular status: stable and blood pressure returned to baseline Anesthetic complications: no    Last Vitals:  Filed Vitals:   09/19/15 0806 09/19/15 1412  BP: 166/58 190/91  Pulse: 101 98  Temp: 36.5 C 36.4 C  Resp:  20    Last Pain:  Filed Vitals:   09/19/15 1414  PainSc: 0-No pain                 Manolo Bosket,JAMES TERRILL

## 2015-09-19 NOTE — Progress Notes (Signed)
[place] for Apixabab --> heparin Indication: Atrial Fibrillation  No Known Allergies  Patient Measurements: Height: 5\' 6"  (167.6 cm) Weight: 147 lb (66.679 kg) IBW/kg (Calculated) : 59.3 Heparin Dosing Weight: 66kg  Vital Signs: Temp: 97.7 F (36.5 C) (06/18 0806) Temp Source: Oral (06/18 0806) BP: 166/58 mmHg (06/18 0806) Pulse Rate: 101 (06/18 0806)  Labs:  Recent Labs  09/17/15 0415 09/18/15 0437 09/19/15 0418 09/19/15 1207  HGB 8.8* 9.6* 10.3*  --   HCT 25.4* 27.2* 29.3*  --   PLT 268 302 327  --   APTT  --   --  64*  --   HEPARINUNFRC  --   --  0.40 0.51  CREATININE 1.04* 0.99  --   --     Estimated Creatinine Clearance: 35.4 mL/min (by C-G formula based on Cr of 0.99).   Medical History: Past Medical History  Diagnosis Date  . Pneumonia   . Hypertension   . Small bowel obstruction ([place])     "I don't know how many" (01/11/2015)  . Ventral hernia with bowel obstruction   . Asthma   . Hyperlipidemia   . Stroke ([place])     "light one"  . COPD (chronic obstructive pulmonary disease) ([place])   . Hypothyroidism   . Type II diabetes mellitus ([place])   . Anemia     previous blood transfusions  . GERD (gastroesophageal reflux disease)   . History of stomach ulcers   . Arthritis     "all over"  . Renal insufficiency   . Depression     "light case"  . SIADH (syndrome of inappropriate ADH production) ([place])     [name] 01/10/2015  . Perforated gastric ulcer ([place])   . Gastric stenosis   . Paraesophageal hernia   . Bradycardia     topped BB and amiodarone decreased- junctional rhythm    Assessment: 89 yoF on chronic apixaban for hx atrial fibrillation hospitalized for GOO, transitioned prior to admission apixaban to IV heparin at admission for EGD on 6/14.  She required dilation of moderate stenosis and suction of retained fluid and small food fragments. GI ok with resuming anticoagulation 6/15.  Patient initially tolerated  diet, but again having abd distention and nausea 6/17.  NGT placed 6/16pm with 1772ml out.Pharmacy consulted to resume heparin gtt again.    PTA apixaban 2.5mg  PO BID  09/19/2015  Confirmatory heparin level now back therapeutic at 0.51  CBC: Hgb low.stable,pltc WNL  No bleeding documented   Goal of Therapy:  Monitor platelets by anticoagulation protocol: Yes  Heparin level 0.3-0.6 APTT 66-102 sec   Plan:   Continue heparin at 650units/hr  With heparin level being therapeutic, apixaban is likely to out of her system -- will d/c aPTT   Daily heparin level, CBC  Monitor for s/s bleeding  Await long-term plans (? Feeding tube)  Dia Sitter, PharmD, BCPS 09/19/2015 2:13 PM

## 2015-09-20 ENCOUNTER — Encounter (HOSPITAL_COMMUNITY): Payer: Self-pay | Admitting: Gastroenterology

## 2015-09-20 DIAGNOSIS — K311 Adult hypertrophic pyloric stenosis: Secondary | ICD-10-CM

## 2015-09-20 LAB — CBC
HCT: 28.3 % — ABNORMAL LOW (ref 36.0–46.0)
Hemoglobin: 10.1 g/dL — ABNORMAL LOW (ref 12.0–15.0)
MCH: 33.6 pg (ref 26.0–34.0)
MCHC: 35.7 g/dL (ref 30.0–36.0)
MCV: 94 fL (ref 78.0–100.0)
Platelets: 326 10*3/uL (ref 150–400)
RBC: 3.01 MIL/uL — ABNORMAL LOW (ref 3.87–5.11)
RDW: 17.8 % — ABNORMAL HIGH (ref 11.5–15.5)
WBC: 14.2 10*3/uL — ABNORMAL HIGH (ref 4.0–10.5)

## 2015-09-20 LAB — HEPARIN LEVEL (UNFRACTIONATED)
Heparin Unfractionated: 0.33 IU/mL (ref 0.30–0.70)
Heparin Unfractionated: 0.44 IU/mL (ref 0.30–0.70)

## 2015-09-20 MED ORDER — AMPICILLIN-SULBACTAM SODIUM 3 (2-1) G IJ SOLR
3.0000 g | Freq: Four times a day (QID) | INTRAMUSCULAR | Status: DC
  Administered 2015-09-20 – 2015-09-24 (×16): 3 g via INTRAVENOUS
  Filled 2015-09-20 (×18): qty 3

## 2015-09-20 NOTE — Progress Notes (Signed)
Progress Note   Subjective  Shannon [name] is a 82 y/o AA female who was admitted to the hospital on 09/11/15 for repeat symptoms of GOO. Pt has EGD 09/15/15 showing pyloric stenosis which was dilated, at that time diet was slowly advanced, but once the pt had soft foods on 6/16 her symptoms returned. NG tube had to be replaced late that day and pt was made NPO.  Today, the pt tells me that she generally feels well other than a soreness in her throat and it is "hard to swallow with the tube in there". She tells me she is aware that a feeding tube is being discussed and seems generally ok with this plan if it means she can go home and things will stop getting "backed up". She would like a conversation to be had between the surgeon and her daughter before this takes place. She denies new symptoms. She has not had a BM for 1-2 days since she has "not been able to eat anything".   Objective   Vital signs in last 24 hours: Temp:  [97.6 F (36.4 C)-98.9 F (37.2 C)] 98.2 F (36.8 C) (06/19 0533) Pulse Rate:  [65-98] 65 (06/19 0650) Resp:  [20] 20 (06/19 0533) BP: (143-190)/(66-91) 143/66 mmHg (06/19 0650) SpO2:  [98 %] 98 % (06/19 0533) Last BM Date: 09/17/15 General: Pleasant African American female in NAD with NG tube in place Heart: Regular rate and rhythm; no murmurs Lungs: Respirations even and unlabored, lungs CTA bilaterally Abdomen: Minimal distension and tenderness in the epigastrum;Normal bowel sounds. Extremities: Without edema. Neurologic: Alert and oriented, grossly normal neurologically. Psych: Cooperative. Normal mood and affect.  Intake/Output from previous day: 06/18 0701 - 06/19 0700 In: 456 [I.V.:156; IV Piggyback:300] Out: 600 [Emesis/NG output:600]  Lab Results:  Recent Labs  09/18/15 0437 09/19/15 0418 09/20/15 0446  WBC 11.3* 10.9* 14.2*  HGB 9.6* 10.3* 10.1*  HCT 27.2* 29.3* 28.3*  PLT 302 327 326   BMET  Recent Labs  09/18/15 0437  NA 138  K  3.6  CL 109  CO2 23  GLUCOSE 61*  BUN 5*  CREATININE 0.99  CALCIUM 8.5*   LFT No results for input(s): PROT, ALBUMIN, AST, ALT, ALKPHOS, BILITOT, BILIDIR, IBILI in the last 72 hours. PT/INR No results for input(s): LABPROT, INR in the last 72 hours.  Studies/Results: No results found.     Assessment / Plan:   Impression: 1. Nausea and vomiting in patient with history of the same: Complicated patient with multiple abdominal surgeries, history of small bowel obstructions, pyloric stenosis, functional gastric outlet obstruction; Candida esophagitis on latest EGD on 07/08/2015; recent admission with improvement of symptoms after NG tube-patient returned hospital 4 days after last discharge with continued symptoms; CT abdomen and pelvis at time of admission showed small left pleural effusion with findings concerning for bibasilar pneumonia, severe gastric distention likely related to chronic gastric dysmotility or partial gastric outlet obstruction, mild haziness of the proximal duodenum concerning for duodenitis with no evidence of small bowel obstruction, esophagitis and colonic diverticulosis without active inflammatory changes; patient improved after NG tube placement and removal of almost 3000 mL of gastric contents; EGD on 09/15/15 revealed moderate stenosis in the previous surgical anastomosis which was dilated to 15 mm, there were also food contents found in the stomach indicating possible gastroparesis. Patient was started on Reglan 3 times a day on 09/15/2015 as well as erythromycin 250 mg IV 4 times daily-pt improved for 2 days, but when  started back on soft food diet on 6/16 she had repeat of symptoms and she was again made NPO and NG tube was replaced-currently improved after almost 2000cc have again been removed-plan to discuss [place] tube placement with Dr. Hilarie Fredrickson today 2. Constipation: Controlled on twice a day MiraLAX at home, per daughter this causes her to be nauseous and having a lot  of gas.  Plan: 1. Continue Reglan and Erythromycin as prescribed. 2. Continue supportive measures 3. Continue NPO 4. Will discuss GJ tube placement with Dr. Hilarie Fredrickson and pt daughter today, please await any further recommendations  Thank you for your kind consultation, we will continue to follow.  Principal Problem:   HCAP (healthcare-associated pneumonia) Active Problems:   Essential hypertension, benign   DM (diabetes mellitus) type II uncontrolled, periph vascular disorder (HCC)   Hypothyroidism   Depression   Anemia of chronic disease   Gastric distention   Acute renal failure superimposed on stage 3 chronic kidney disease (HCC)   Atrial fibrillation (HCC)   N&V (nausea and vomiting)   Diabetic osteomyelitis (HCC)   PVD (peripheral vascular disease) (HCC)   Hyperkalemia   Osteomyelitis of ankle or foot ([place])   Pressure ulcer     LOS: 8 days   [name]  09/20/2015, 8:37 AM  Pager # [phone]

## 2015-09-20 NOTE — Progress Notes (Signed)
Pharmacy Antibiotic Note  Shannon [name] is a 82 y.o. female admitted on 09/11/2015 with pneumonia, osteomyelitis of right 3rd toe.  Pharmacy was consulted for Vancomycin, cefepime dosing.  Antibiotics changed to ampicillin/sulbactam 6/14. Day #9 antibiotics.  SCr stable, WBC increased, Afebrile.  Plan:  Increase to Unasyn 3 gm IV q6h   Follow up renal function, culture results  Suggest convert to oral augment 500mg  BID as prescribed prior to admission.  Plan was to continue x 30 days (until 7/4)  Height: 5\' 6"  (167.6 cm) Weight: 147 lb (66.679 kg) IBW/kg (Calculated) : 59.3  Temp (24hrs), Avg:98.2 F (36.8 C), Min:97.6 F (36.4 C), Max:98.9 F (37.2 C)   Recent Labs Lab 09/14/15 0435 09/15/15 0456 09/16/15 0322 09/17/15 0415 09/18/15 0437 09/19/15 0418 09/20/15 0446  WBC 15.7* 14.4* 10.6* 10.8* 11.3* 10.9* 14.2*  CREATININE 1.22* 1.16* 1.01* 1.04* 0.99  --   --     Estimated Creatinine Clearance: 35.4 mL/min (by C-G formula based on Cr of 0.99).    No Known Allergies  Antimicrobials this admission: Vancomycin 6/11 >> 6/14 Cefepime 6/11 >> 6/14 Amp/sulb 6/14 >> Erythromycin PO 6/14 >> 6/16  Dose adjustments this admission: (previous admission, 6/1 VT = 14 on vanc 750 q24h with SCr 1.4-1.6 range)  Microbiology results: 6/11 BCx: NGF 6/11 UCx: 8K insignificant growth 6/13 MRSA PCR: neg (5/27 Cdiff neg)   Thank you for allowing pharmacy to be a part of this patient's care.  Gretta Arab PharmD, BCPS Pager [phone] 09/20/2015 8:17 AM

## 2015-09-20 NOTE — Progress Notes (Signed)
Pharmacy - Heparin  Assessment:    Please see note from Gretta Arab, PharmD earlier today for full details.  Briefly, 82 y.o. female on IV heparin for Afib with possible surgeries planned.  Heparin level rechecked as had been dropping   Repeat heparin level therapeutic at 0.44 on 650 units/hr  Plan:   Continue heparin as ordered  Reuel Boom, PharmD, BCPS Pager: [phone] 09/20/2015, 6:15 PM

## 2015-09-20 NOTE — Progress Notes (Signed)
[place] for Apixabab --> heparin Indication: Atrial Fibrillation  No Known Allergies  Patient Measurements: Height: 5\' 6"  (167.6 cm) Weight: 147 lb (66.679 kg) IBW/kg (Calculated) : 59.3 Heparin Dosing Weight: 66kg  Vital Signs: Temp: 98.2 F (36.8 C) (06/19 0533) Temp Source: Oral (06/19 0533) BP: 143/66 mmHg (06/19 0650) Pulse Rate: 65 (06/19 0650)  Labs:  Recent Labs  09/18/15 0437 09/19/15 0418 09/19/15 1207 09/20/15 0446  HGB 9.6* 10.3*  --  10.1*  HCT 27.2* 29.3*  --  28.3*  PLT 302 327  --  326  APTT  --  64*  --   --   HEPARINUNFRC  --  0.40 0.51 0.33  CREATININE 0.99  --   --   --     Estimated Creatinine Clearance: 35.4 mL/min (by C-G formula based on Cr of 0.99).   Medical History: Past Medical History  Diagnosis Date  . Pneumonia   . Hypertension   . Small bowel obstruction ([place])     "I don't know how many" (01/11/2015)  . Ventral hernia with bowel obstruction   . Asthma   . Hyperlipidemia   . Stroke ([place])     "light one"  . COPD (chronic obstructive pulmonary disease) ([place])   . Hypothyroidism   . Type II diabetes mellitus ([place])   . Anemia     previous blood transfusions  . GERD (gastroesophageal reflux disease)   . History of stomach ulcers   . Arthritis     "all over"  . Renal insufficiency   . Depression     "light case"  . SIADH (syndrome of inappropriate ADH production) ([place])     [name] 01/10/2015  . Perforated gastric ulcer ([place])   . Gastric stenosis   . Paraesophageal hernia   . Bradycardia     topped BB and amiodarone decreased- junctional rhythm    Assessment: 89 yoF on chronic apixaban for hx atrial fibrillation hospitalized for GOO, transitioned prior to admission apixaban to IV heparin at admission for EGD on 6/14.  She required dilation of moderate stenosis and suction of retained fluid and small food fragments. GI ok with resuming anticoagulation 6/15.  Patient initially tolerated  diet, but again having abd distention and nausea requiring NGT placement.  Pharmacy consulted to resume heparin gtt again.    PTA apixaban 2.5mg  PO BID  09/20/2015  Heparin level: 0.33, therapeutic at low end of range.  CBC: Hgb low/stable, Pltc WNL  No bleeding documented   Goal of Therapy:  Monitor platelets by anticoagulation protocol: Yes  Heparin level 0.3-0.6 APTT 66-102 sec   Plan:   Continue heparin at 650 units/hr  Plan to hold prior to procedure (feeding tube 6/19), but no time scheduled yet.  Daily heparin level, CBC  Monitor for s/s bleeding  Follow up long-term anticoagulation plans   Gretta Arab PharmD, BCPS Pager [phone] 09/20/2015 7:01 AM

## 2015-09-20 NOTE — Progress Notes (Signed)
PROGRESS NOTE    Shannon [name]  ION:[id number] DOB: 1923/08/26 DOA: 09/11/2015 PCP: Kirt Boys, DO   Brief Narrative:  HPI on 09/12/2015 by Dr. Marcial Pacas Opyd Shannon [name] is a medically complex 82 y.o. female with medical history significant for hypertension, hyperlipidemia, hypothyroidism, peripheral arterial disease with ischemic toe complicated by osteomyelitis, and chronic gastric distention with recurrent nausea and vomiting who presents to the ED for evaluation of abdominal pain with nausea and vomiting of 2 days' duration. Patient had a recent admission, just discharged on 09/05/2015 following management of abdominal pain with nausea and vomiting. She had an NG tube placed at that time and was started on liquid diet, essentially improving spontaneously and tolerating full diet. She was also noted to have an ischemic third toe on the right foot secondary to PAD and workup revealed underlying osteomyelitis. Surgery was advised for management of this, however patient declined. She was discharged with a one-month course of Augmentin. She had initially done well back at home before the insidious development of recurrent abdominal pain with nausea and vomiting. She reports the vomitus as nonbloody and nonbilious. There's been no diarrhea; in fact she has not had a bowel movement in 2-3 days. She denies chest pain or palpitations, but does endorse dyspnea with minimal exertion. She denies any significant cough.  Interim history Patient found to have pyloric stenosis. Had EGD with dilatation. Patient was also treated for healthcare associated pneumonia. Plan is to have postpyloric feeding tube placed today.  Assessment & Plan   HCAP  -likely aspiration, from gastric outlet obstruction  -Currently appears nontoxic currently afebrile -Was treated with vancomycin and cefepime and transitioned to Unasyn -cultures negative  Gastric outlet obstruction due to pyloric stenosis -Initially there was  improvement after EGD and dilatation but still not back to baseline. -On 09/18/2015 patient has re-insertion of NG tube due to abdominal distention with significant output. -As per GI patient may need postpyloric feeding tube placement if she is unable to tolerate oral diet. -Pending further recommendations   Right third toe gangrene  -On Augmentin as an outpatient and Unasyn in the hospital, refused amputation last admission, again refuses, confirmed with her daughter.  -Suppressive antibiotic course ongoing for 30 days.  -After antibiotic course she wants to continue with wound care and supportive care.  -If worsening then daughter requested to consider comfort care. -Last day antibiotics 10/07/2015.  Dyslipidemia -On statin.  Depression  -On Zoloft.  Hypothyroidism  -recently Synthroid increased to 88 g few days ago, repeat TSH in 4 weeks from now. -Change to IV Synthroid at present.  GERD -On PPI  Paroxysmal atrial fibrillation with RVR -CHADSVASC 5 -Continue amiodarone and transitioned to IV heparin again in the setting of patient remaining nothing by mouth.  History of asthma/COPD -Stable.  Hypokalemia  -Replaced, continue to monitor BMP  DVT Prophylaxis    Code Status: partial code, DNR, DNI  Family Communication: None at discharge  Disposition Plan: Admitted. Pending further recommendations from GI vs surgery   Consultants Gastroenterology  Procedures  EGD  Antibiotics   Anti-infectives    Start     Dose/Rate Route Frequency Ordered Stop   09/20/15 1200  Ampicillin-Sulbactam (UNASYN) 3 g in sodium chloride 0.9 % 100 mL IVPB     3 g 100 mL/hr over 60 Minutes Intravenous Every 6 hours 09/20/15 0829     09/16/15 1200  erythromycin 250 mg in sodium chloride 0.9 % 100 mL IVPB  Status:  Discontinued  250 mg 100 mL/hr over 60 Minutes Intravenous Every 6 hours 09/16/15 0846 09/17/15 1002   09/15/15 1800  ampicillin-sulbactam (UNASYN) 1.5 g in sodium  chloride 0.9 % 50 mL IVPB  Status:  Discontinued     1.5 g 100 mL/hr over 30 Minutes Intravenous Every 6 hours 09/15/15 1103 09/20/15 0829   09/15/15 1715  erythromycin (E-MYCIN) tablet 250 mg  Status:  Discontinued     250 mg Oral 3 times daily before meals & bedtime 09/15/15 1656 09/16/15 0846   09/13/15 0600  vancomycin (VANCOCIN) IVPB 750 mg/150 ml premix  Status:  Discontinued     750 mg 150 mL/hr over 60 Minutes Intravenous Every 24 hours 09/12/15 0546 09/15/15 1036   09/12/15 2200  ceFEPIme (MAXIPIME) 1 g in dextrose 5 % 50 mL IVPB  Status:  Discontinued     1 g 100 mL/hr over 30 Minutes Intravenous Daily at bedtime 09/12/15 0410 09/15/15 1036   09/12/15 0430  ceFEPIme (MAXIPIME) 1 g in dextrose 5 % 50 mL IVPB     1 g 100 mL/hr over 30 Minutes Intravenous  Once 09/12/15 0427 09/12/15 0540   09/12/15 0100  vancomycin (VANCOCIN) IVPB 1000 mg/200 mL premix     1,000 mg 200 mL/hr over 60 Minutes Intravenous  Once 09/12/15 0046 09/12/15 0400   09/12/15 0100  piperacillin-tazobactam (ZOSYN) IVPB 3.375 g     3.375 g 100 mL/hr over 30 Minutes Intravenous  Once 09/12/15 0046 09/12/15 0222      Subjective:   Shannon [name] seen and examined today.  Patient would like the NG tube removed. Would like to drink fluids. Denies chest pain, shortness of breath.    Objective:   Filed Vitals:   09/19/15 1900 09/19/15 2113 09/20/15 0533 09/20/15 0650  BP: 176/77 164/74 162/68 143/66  Pulse: 71 73 75 65  Temp:  98.9 F (37.2 C) 98.2 F (36.8 C)   TempSrc:  Oral Oral   Resp:  20 20   Height:      Weight:      SpO2:  98% 98%     Intake/Output Summary (Last 24 hours) at 09/20/15 1351 Last data filed at 09/20/15 0915  Gross per 24 hour  Intake    656 ml  Output    600 ml  Net     56 ml   Filed Weights   09/11/15 2303 09/12/15 0241 09/15/15 1412  Weight: 68.04 kg (150 lb) 66.724 kg (147 lb 1.6 oz) 66.679 kg (147 lb)    Exam  General: Well developed, well nourished, NAD, appears  stated age  HEENT: NCAT, mucous membranes moist. NG tube   Cardiovascular: S1 S2 auscultated, no murmur, irregular   Respiratory: Clear to auscultation bilaterally   Abdomen: Soft, mild epigastric TTP, mildly distended, + bowel sounds  Extremities: warm dry without cyanosis clubbing or edema  Neuro: AAOx3, nonfocal   Psych: Normal affect and demeanor    Data Reviewed: I have personally reviewed following labs and imaging studies  CBC:  Recent Labs Lab 09/16/15 0322 09/17/15 0415 09/18/15 0437 09/19/15 0418 09/20/15 0446  WBC 10.6* 10.8* 11.3* 10.9* 14.2*  NEUTROABS  --  2.8  --  2.9  --   HGB 8.8* 8.8* 9.6* 10.3* 10.1*  HCT 25.4* 25.4* 27.2* 29.3* 28.3*  MCV 95.1 93.0 94.8 93.6 94.0  PLT 281 268 302 327 326   Basic Metabolic Panel:  Recent Labs Lab 09/14/15 0435 09/15/15 0456 09/16/15 0322 09/17/15 0415 09/18/15 0437 09/19/15  0418  NA 141 140 137 136 138  --   K 3.6 4.0 3.5 3.7 3.6  --   CL 112* 112* 110 109 109  --   CO2 21* 17* 22 22 23   --   GLUCOSE 56* 60* 83 81 61*  --   BUN 13 10 7  <5* 5*  --   CREATININE 1.22* 1.16* 1.01* 1.04* 0.99  --   CALCIUM 8.5* 8.4* 8.0* 8.2* 8.5*  --   MG 1.6*  --  1.7 1.5* 1.8 1.6*  PHOS  --   --  1.8*  --   --   --    GFR: Estimated Creatinine Clearance: 35.4 mL/min (by C-G formula based on Cr of 0.99). Liver Function Tests:  Recent Labs Lab 09/17/15 0415  AST 23  ALT 16  ALKPHOS 40  BILITOT 0.6  PROT 5.8*  ALBUMIN 2.5*   No results for input(s): LIPASE, AMYLASE in the last 168 hours. No results for input(s): AMMONIA in the last 168 hours. Coagulation Profile: No results for input(s): INR, PROTIME in the last 168 hours. Cardiac Enzymes: No results for input(s): CKTOTAL, CKMB, CKMBINDEX, TROPONINI in the last 168 hours. BNP (last 3 results) No results for input(s): PROBNP in the last 8760 hours. HbA1C: No results for input(s): HGBA1C in the last 72 hours. CBG:  Recent Labs Lab 09/15/15 0735  09/15/15 0915 09/15/15 1805 09/17/15 0025  GLUCAP 55* 111* 117* 93   Lipid Profile: No results for input(s): CHOL, HDL, LDLCALC, TRIG, CHOLHDL, LDLDIRECT in the last 72 hours. Thyroid Function Tests: No results for input(s): TSH, T4TOTAL, FREET4, T3FREE, THYROIDAB in the last 72 hours. Anemia Panel: No results for input(s): VITAMINB12, FOLATE, FERRITIN, TIBC, IRON, RETICCTPCT in the last 72 hours. Urine analysis:    Component Value Date/Time   COLORURINE YELLOW 09/12/2015 0036   APPEARANCEUR CLEAR 09/12/2015 0036   LABSPEC 1.025 09/12/2015 0036   PHURINE 6.5 09/12/2015 0036   GLUCOSEU NEGATIVE 09/12/2015 0036   HGBUR NEGATIVE 09/12/2015 0036   BILIRUBINUR NEGATIVE 09/12/2015 0036   KETONESUR NEGATIVE 09/12/2015 0036   PROTEINUR 100* 09/12/2015 0036   UROBILINOGEN 0.2 01/27/2015 2308   NITRITE NEGATIVE 09/12/2015 0036   LEUKOCYTESUR NEGATIVE 09/12/2015 0036   Sepsis Labs: @LABRCNTIP (procalcitonin:4,lacticidven:4)  ) Recent Results (from the past 240 hour(s))  Urine culture     Status: Abnormal   Collection Time: 09/12/15 12:36 AM  Result Value Ref Range Status   Specimen Description URINE, CLEAN CATCH  Final   Special Requests NONE  Final   Culture (A)  Final    8,000 COLONIES/mL INSIGNIFICANT GROWTH Performed at [facility]    Report Status 09/13/2015 FINAL  Final  Culture, blood (Routine X 2) w Reflex to ID Panel     Status: None   Collection Time: 09/12/15  1:23 AM  Result Value Ref Range Status   Specimen Description BLOOD RIGHT ANTECUBITAL  Final   Special Requests BOTTLES DRAWN AEROBIC AND ANAEROBIC 5CC  Final   Culture   Final    NO GROWTH 5 DAYS Performed at [facility]    Report Status 09/17/2015 FINAL  Final  Culture, blood (Routine X 2) w Reflex to ID Panel     Status: None   Collection Time: 09/12/15  1:52 AM  Result Value Ref Range Status   Specimen Description BLOOD LEFT HAND  Final   Special Requests BOTTLES DRAWN AEROBIC AND  ANAEROBIC  Final   Culture   Final  NO GROWTH 5 DAYS Performed at [facility]    Report Status 09/17/2015 FINAL  Final  MRSA PCR Screening     Status: None   Collection Time: 09/14/15  3:25 PM  Result Value Ref Range Status   MRSA by PCR NEGATIVE NEGATIVE Final    Comment:        The GeneXpert MRSA Assay (FDA approved for NASAL specimens only), is one component of a comprehensive MRSA colonization surveillance program. It is not intended to diagnose MRSA infection nor to guide or monitor treatment for MRSA infections.       Radiology Studies: No results found.   Scheduled Meds: . amiodarone  100 mg Oral Daily  . ampicillin-sulbactam (UNASYN) IV  3 g Intravenous Q6H  . carvedilol  3.125 mg Oral BID WC  . chlorhexidine  15 mL Mouth/Throat BID  . cycloSPORINE  1 drop Both Eyes BID  . fluticasone  2 spray Each Nare Daily  . gabapentin  300 mg Oral Daily  . gabapentin  600 mg Oral 2 times per day  . latanoprost  1 drop Both Eyes QHS  . levothyroxine  44 mcg Intravenous Daily  . metoCLOPramide (REGLAN) injection  5 mg Intravenous Q8H  . mometasone-formoterol  2 puff Inhalation BID  . nitroGLYCERIN  0.5 inch Topical Q6H  . pantoprazole  40 mg Oral Daily  . polyvinyl alcohol  1 drop Both Eyes QID  . sertraline  100 mg Oral Daily   Continuous Infusions: . heparin 650 Units/hr (09/20/15 0546)     LOS: 8 days   Time Spent in minutes   30 minutes  Shannon Eisenhuth D.O. on 09/20/2015 at 1:51 PM  Between 7am to 7pm - Pager - [phone]  After 7pm go to [email or web address] - password [email or web address]  And look for the night coverage person covering for me after hours  [facility] Office  [phone]

## 2015-09-20 NOTE — Consult Note (Signed)
Reason for Consult:  Gastric outlet obstruction Referring Physician:   Dr. Zenovia Jarred  Shannon [name] is an 82 y.o. female.  HPI:  [name] has an extensive medical history and long standing issues with nausea and vomiting. She had some type of gastric perforation at [facility] and was treated there with Home for this.  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  She had a resection of GJ anastomosis with primary jejunojejunal anastomosis , repair of gastric ulcer, repair of incisional hernia, EGD by Dr. Rivka Barbara and Dr; Ophelia Shoulder at [facility].    She has had issues with nausea on and off since this surgery.  She has had issues with gastric bezoar in the past also lysis of adhesions for SBO 01/2015.  She was seen on 4 /20/17 by Dr. Henrene Pastor with dysphagia secondary to Candida esophagitis, issues with gastric emptying, GERD.  This improved with Diflucan.  She was hospitalized 5/1-08/05/15 with bradycardia and HR in the 30's.  Readmitted on 08/26/15 with several days of nausea and then vomiting again. She was negative for C diff and treated for empirical colitis.  Readmitted again on 09/12/15 with abdominal pain, nausea and vomiting.    She has undergone EGD again on 09/15/15.  This showed Mucosal changes in the esophagus consistent with NG tube trauma.  Retained gastric fluids / small food fragments - no bezoar   A previous surgical anastomosis was found, characterized by moderate stenosis. Dilated to 42mm. Normal examined small bowel Overall, patient does have a pyloric stenosis although could be due to a severely angulated turn and more of a functional outlet obstruction. He elected to dilate to 50mm with good result. Mostly retained liquid contents, not at site of the outlet, also suggests component of gastroparesis to this picture. 500cc of additional fluid was suctioned, some residual thick fluids and small food fragments remain.  She  continues to have issue with nausea and vomiting, she has an NG still draining fluid from her stomach and  Dr. Hilarie Fredrickson has seen and recommends possible GJ bypass to divert the stomach/pyloric stenosis or possible GJ tube.  We are ask to see.    Past Medical History  Diagnosis Date  . Pneumonia   . Hypertension   . Small bowel obstruction ([place])     "I don't know how many" (01/11/2015)  . Ventral hernia with bowel obstruction   . Asthma   . Hyperlipidemia   . Stroke ([place])     "light one"  . COPD (chronic obstructive pulmonary disease) ([place])   . Hypothyroidism   . Type II diabetes mellitus ([place])   . Anemia     previous blood transfusions  . GERD (gastroesophageal reflux disease)   . History of stomach ulcers   . Arthritis     "all over"  . Renal insufficiency   . Depression     "light case"  . SIADH (syndrome of inappropriate ADH production) ([place])     [name] 01/10/2015  . Perforated gastric ulcer ([place])   . Gastric stenosis   . Paraesophageal hernia   . Bradycardia     topped BB and amiodarone decreased- junctional rhythm     Past Surgical History  Procedure Laterality Date  . Tubal ligation    . Colectomy    . Hernia repair  2015  . Esophagogastroduodenoscopy N/A 01/19/2014    Procedure: ESOPHAGOGASTRODUODENOSCOPY (EGD);  Surgeon: Irene Shipper, MD;  Location:  [facility] ENDOSCOPY;  Service: Endoscopy;  Laterality: N/A;  . Esophagogastroduodenoscopy N/A 01/20/2014    Procedure: ESOPHAGOGASTRODUODENOSCOPY (EGD);  Surgeon: Irene Shipper, MD;  Location: [facility];  Service: Endoscopy;  Laterality: N/A;  . Esophagogastroduodenoscopy N/A 03/19/2014    Procedure: ESOPHAGOGASTRODUODENOSCOPY (EGD);  Surgeon: Milus Banister, MD;  Location: [facility];  Service: Endoscopy;  Laterality: N/A;  . Cholecystectomy open    . Ventral hernia repair  2015    incarcerated ventral hernia ([facility] 09/2013)/notes 01/10/2015  . Cataract extraction w/ intraocular lens  implant, bilateral    .  Gastrojejunostomy      hx/notes 01/10/2015  . Laparotomy N/A 01/20/2015    Procedure: EXPLORATORY LAPAROTOMY;  Surgeon: Coralie Keens, MD;  Location: [place];  Service: General;  Laterality: N/A;  . Lysis of adhesion N/A 01/20/2015    Procedure: LYSIS OF ADHESIONS < 1 HOUR;  Surgeon: Coralie Keens, MD;  Location: [place];  Service: General;  Laterality: N/A;  . Esophagogastroduodenoscopy N/A 07/08/2015    Procedure: ESOPHAGOGASTRODUODENOSCOPY (EGD);  Surgeon: Doran Stabler, MD;  Location: [facility] ENDOSCOPY;  Service: Endoscopy;  Laterality: N/A;  . Esophagogastroduodenoscopy (egd) with propofol N/A 09/15/2015    Procedure: ESOPHAGOGASTRODUODENOSCOPY (EGD) WITH PROPOFOL;  Surgeon: Manus Gunning, MD;  Location: [facility] ENDOSCOPY;  Service: Gastroenterology;  Laterality: N/A;    Family History  Problem Relation Age of Onset  . Stroke Mother   . Heart attack Neg Hx   . Hypertension Mother   . Diabetes Brother     Social History:  reports that she has never smoked. She has quit using smokeless tobacco. Her smokeless tobacco use included Snuff. She reports that she does not drink alcohol or use illicit drugs.  Allergies: No Known Allergies  Medications:  Prior to Admission:  Prescriptions prior to admission  Medication Sig Dispense Refill Last Dose  . acetaminophen (TYLENOL) 325 MG tablet Take 325 mg by mouth every 6 (six) hours as needed for mild pain or fever.    unknown  . ADVAIR DISKUS 250-50 MCG/DOSE AEPB INHALE 1 PUFF INTO THE LUNGS 2 (TWO) TIMES DAILY AS NEEDED FOR SHORTNESS OF BREATH  3 Past Week at Unknown time  . albuterol (PROVENTIL HFA;VENTOLIN HFA) 108 (90 Base) MCG/ACT inhaler Inhale 2 puffs into the lungs every 6 (six) hours as needed for wheezing or shortness of breath. 1 Inhaler 2 Past Week at Unknown time  . amiodarone (PACERONE) 100 MG tablet Take 1 tablet (100 mg total) by mouth daily. 30 tablet 1 09/12/2015 at Unknown time  . amLODipine (NORVASC) 10 MG tablet TAKE 1  TABLET BY MOUTH DAILY FOR HYPERTENSION 30 tablet 3 09/12/2015 at Unknown time  . amoxicillin-clavulanate (AUGMENTIN) 500-125 MG tablet Take 1 tablet (500 mg total) by mouth 2 (two) times daily. 60 tablet 0 09/12/2015 at Unknown time  . apixaban (ELIQUIS) 2.5 MG TABS tablet Take 1 tablet (2.5 mg total) by mouth 2 (two) times daily. 180 tablet 1 09/12/2015 at 1700  . atorvastatin (LIPITOR) 10 MG tablet Take one tablet by mouth once daily 90 tablet 1 09/12/2015 at Unknown time  . b complex vitamins tablet Take 1 tablet by mouth daily.    09/12/2015 at Unknown time  . calcium-vitamin D (OSCAL WITH D) 500-200 MG-UNIT per tablet Take 1 tablet by mouth daily with breakfast.    09/12/2015 at Unknown time  . carvedilol (COREG) 3.125 MG tablet Take 1 tablet (3.125 mg total) by mouth 2 (two) times daily with a meal. 60 tablet 0  09/12/2015 at 1700  . chlorhexidine (PERIDEX) 0.12 % solution Use as directed 15 mLs in the mouth or throat 2 (two) times daily.   09/12/2015 at Unknown time  . [employer] GAS RELIEF 80 MG chewable tablet CHEW 1 TABLET BY MOUTH 4 TIMES A DAY AS NEEDED FOR FLATULENCE 30 tablet 0 09/12/2015 at Unknown time  . diclofenac sodium (VOLTAREN) 1 % GEL Apply 2 g topically daily as needed (for pain).    Past Week at Unknown time  . feeding supplement (BOOST / RESOURCE BREEZE) LIQD Take 1 Container by mouth 3 (three) times daily between meals. 90 Container 0 09/12/2015 at Unknown time  . fluticasone (FLONASE) 50 MCG/ACT nasal spray Place 2 sprays into both nostrils daily. 16 g 3 09/11/2015 at Unknown time  . gabapentin (NEURONTIN) 300 MG capsule Take 2 caps po in AM and qhs, take 1 cap daily at [facility] (Patient taking differently: Take 300-600 mg by mouth 3 (three) times daily. Take 2 caps po in AM and qhs, take 1 cap daily at [facility]) 150 capsule 6 09/12/2015 at 1200  . hydrALAZINE (APRESOLINE) 50 MG tablet Take 1 tablet (50 mg total) by mouth 4 (four) times daily. 270 tablet 3 09/12/2015 at 1700  . isosorbide mononitrate  (IMDUR) 30 MG 24 hr tablet TAKE 1 TABLET (30 MG TOTAL) BY MOUTH DAILY.  2 09/12/2015 at Unknown time  . lactulose (CHRONULAC) 10 GM/15ML solution Take 15 mLs (10 g total) by mouth daily as needed for mild constipation. 240 mL 0 Past Month at Unknown time  . latanoprost (XALATAN) 0.005 % ophthalmic solution Place 1 drop into both eyes at bedtime. 2.5 mL 12 09/11/2015 at Unknown time  . levothyroxine (SYNTHROID, LEVOTHROID) 88 MCG tablet Take 1 tablet (88 mcg total) by mouth daily before breakfast. 30 tablet 1 09/12/2015 at Unknown time  . metoCLOPramide (REGLAN) 5 MG tablet Take 1 tablet (5 mg total) by mouth 4 (four) times daily -  before meals and at bedtime. 120 tablet 0 09/12/2015 at 1600  . ondansetron (ZOFRAN) 4 MG tablet Take 1 tablet (4 mg total) by mouth every 8 (eight) hours as needed for nausea or vomiting. 20 tablet 0 09/12/2015 at Unknown time  . pantoprazole (PROTONIX) 40 MG tablet TAKE 1 TABLET BY MOUTH TWICE A DAY 60 tablet 5 09/12/2015 at 1600  . Polyethyl Glycol-Propyl Glycol 0.4-0.3 % SOLN Place 1 drop into both eyes 4 (four) times daily.    09/12/2015 at Unknown time  . polyethylene glycol (MIRALAX / GLYCOLAX) packet Take 17 g by mouth 2 (two) times daily.    09/12/2015 at Unknown time  . RESTASIS 0.05 % ophthalmic emulsion USE 1 DROP INTO BOTH EYES TWICE DAILY 60 mL 1 09/12/2015 at Unknown time  . sennosides-docusate sodium (SENOKOT-S) 8.6-50 MG tablet Take 1 tablet by mouth 2 (two) times daily.   09/12/2015 at Unknown time  . sertraline (ZOLOFT) 100 MG tablet Take one tablet by mouth once daily for depression 90 tablet 1 09/12/2015 at Unknown time  . traMADol (ULTRAM) 50 MG tablet Take 50 mg by mouth 2 (two) times daily as needed for moderate pain.    Past Week at Unknown time   Scheduled: . amiodarone  100 mg Oral Daily  . ampicillin-sulbactam (UNASYN) IV  3 g Intravenous Q6H  . carvedilol  3.125 mg Oral BID WC  . chlorhexidine  15 mL Mouth/Throat BID  . cycloSPORINE  1 drop Both Eyes  BID  . fluticasone  2 spray Each  Nare Daily  . gabapentin  300 mg Oral Daily  . gabapentin  600 mg Oral 2 times per day  . latanoprost  1 drop Both Eyes QHS  . levothyroxine  44 mcg Intravenous Daily  . metoCLOPramide (REGLAN) injection  5 mg Intravenous Q8H  . mometasone-formoterol  2 puff Inhalation BID  . nitroGLYCERIN  0.5 inch Topical Q6H  . pantoprazole  40 mg Oral Daily  . polyvinyl alcohol  1 drop Both Eyes QID  . sertraline  100 mg Oral Daily   Continuous: . heparin 650 Units/hr (09/20/15 0546)   [id number], albuterol, bisacodyl, diclofenac sodium, hydrALAZINE, lidocaine, lip balm, metoprolol, ondansetron (ZOFRAN) IV, phenol Anti-infectives    Start     Dose/Rate Route Frequency Ordered Stop   09/20/15 1200  Ampicillin-Sulbactam (UNASYN) 3 g in sodium chloride 0.9 % 100 mL IVPB     3 g 100 mL/hr over 60 Minutes Intravenous Every 6 hours 09/20/15 0829     09/16/15 1200  erythromycin 250 mg in sodium chloride 0.9 % 100 mL IVPB  Status:  Discontinued     250 mg 100 mL/hr over 60 Minutes Intravenous Every 6 hours 09/16/15 0846 09/17/15 1002   09/15/15 1800  ampicillin-sulbactam (UNASYN) 1.5 g in sodium chloride 0.9 % 50 mL IVPB  Status:  Discontinued     1.5 g 100 mL/hr over 30 Minutes Intravenous Every 6 hours 09/15/15 1103 09/20/15 0829   09/15/15 1715  erythromycin (E-MYCIN) tablet 250 mg  Status:  Discontinued     250 mg Oral 3 times daily before meals & bedtime 09/15/15 1656 09/16/15 0846   09/13/15 0600  vancomycin (VANCOCIN) IVPB 750 mg/150 ml premix  Status:  Discontinued     750 mg 150 mL/hr over 60 Minutes Intravenous Every 24 hours 09/12/15 0546 09/15/15 1036   09/12/15 2200  ceFEPIme (MAXIPIME) 1 g in dextrose 5 % 50 mL IVPB  Status:  Discontinued     1 g 100 mL/hr over 30 Minutes Intravenous Daily at bedtime 09/12/15 0410 09/15/15 1036   09/12/15 0430  ceFEPIme (MAXIPIME) 1 g in dextrose 5 % 50 mL IVPB     1 g 100 mL/hr over 30 Minutes Intravenous  Once  09/12/15 0427 09/12/15 0540   09/12/15 0100  vancomycin (VANCOCIN) IVPB 1000 mg/200 mL premix     1,000 mg 200 mL/hr over 60 Minutes Intravenous  Once 09/12/15 0046 09/12/15 0400   09/12/15 0100  piperacillin-tazobactam (ZOSYN) IVPB 3.375 g     3.375 g 100 mL/hr over 30 Minutes Intravenous  Once 09/12/15 0046 09/12/15 0222      Results for orders placed or performed during the hospital encounter of 09/11/15 (from the past 48 hour(s))  Heparin level (unfractionated)     Status: None   Collection Time: 09/19/15  4:18 AM  Result Value Ref Range   Heparin Unfractionated 0.40 0.30 - 0.70 IU/mL    Comment:        IF HEPARIN RESULTS ARE BELOW EXPECTED VALUES, AND PATIENT DOSAGE HAS BEEN CONFIRMED, SUGGEST FOLLOW UP TESTING OF ANTITHROMBIN III LEVELS.   APTT     Status: Abnormal   Collection Time: 09/19/15  4:18 AM  Result Value Ref Range   aPTT 64 (H) 24 - 37 seconds    Comment:        IF BASELINE aPTT IS ELEVATED, SUGGEST PATIENT RISK ASSESSMENT BE USED TO DETERMINE APPROPRIATE ANTICOAGULANT THERAPY.   CBC with Differential/Platelet     Status: Abnormal  Collection Time: 09/19/15  4:18 AM  Result Value Ref Range   WBC 10.9 (H) 4.0 - 10.5 K/uL   RBC 3.13 (L) 3.87 - 5.11 MIL/uL   Hemoglobin 10.3 (L) 12.0 - 15.0 g/dL   HCT 29.3 (L) 36.0 - 46.0 %   MCV 93.6 78.0 - 100.0 fL   MCH 32.9 26.0 - 34.0 pg   MCHC 35.2 30.0 - 36.0 g/dL   RDW 17.7 (H) 11.5 - 15.5 %   Platelets 327 150 - 400 K/uL   Neutrophils Relative % 27 %   Lymphocytes Relative 12 %   Monocytes Relative 6 %   Eosinophils Relative 55 %   Basophils Relative 0 %   Neutro Abs 2.9 1.7 - 7.7 K/uL   Lymphs Abs 1.3 0.7 - 4.0 K/uL   Monocytes Absolute 0.7 0.1 - 1.0 K/uL   Eosinophils Absolute 6.0 (H) 0.0 - 0.7 K/uL   Basophils Absolute 0.0 0.0 - 0.1 K/uL   Smear Review MORPHOLOGY UNREMARKABLE   Magnesium     Status: Abnormal   Collection Time: 09/19/15  4:18 AM  Result Value Ref Range   Magnesium 1.6 (L) 1.7 - 2.4  mg/dL  Heparin level (unfractionated)     Status: None   Collection Time: 09/19/15 12:07 PM  Result Value Ref Range   Heparin Unfractionated 0.51 0.30 - 0.70 IU/mL    Comment:        IF HEPARIN RESULTS ARE BELOW EXPECTED VALUES, AND PATIENT DOSAGE HAS BEEN CONFIRMED, SUGGEST FOLLOW UP TESTING OF ANTITHROMBIN III LEVELS.   CBC     Status: Abnormal   Collection Time: 09/20/15  4:46 AM  Result Value Ref Range   WBC 14.2 (H) 4.0 - 10.5 K/uL   RBC 3.01 (L) 3.87 - 5.11 MIL/uL   Hemoglobin 10.1 (L) 12.0 - 15.0 g/dL   HCT 28.3 (L) 36.0 - 46.0 %   MCV 94.0 78.0 - 100.0 fL   MCH 33.6 26.0 - 34.0 pg   MCHC 35.7 30.0 - 36.0 g/dL   RDW 17.8 (H) 11.5 - 15.5 %   Platelets 326 150 - 400 K/uL  Heparin level (unfractionated)     Status: None   Collection Time: 09/20/15  4:46 AM  Result Value Ref Range   Heparin Unfractionated 0.33 0.30 - 0.70 IU/mL    Comment:        IF HEPARIN RESULTS ARE BELOW EXPECTED VALUES, AND PATIENT DOSAGE HAS BEEN CONFIRMED, SUGGEST FOLLOW UP TESTING OF ANTITHROMBIN III LEVELS.     No results found.  Review of Systems  Constitutional: Negative.   HENT: Negative.   Eyes: Negative.   Respiratory: Negative.   Cardiovascular: Negative.   Gastrointestinal: Positive for heartburn, nausea, vomiting, abdominal pain and constipation. Negative for diarrhea.  Genitourinary: Negative.   Musculoskeletal: Negative.   Skin: Negative.   Neurological: Negative.   Endo/Heme/Allergies: Negative.   Psychiatric/Behavioral: Negative.    Blood pressure 143/66, pulse 65, temperature 98.2 F (36.8 C), temperature source Oral, resp. rate 20, height 5\' 6"  (1.676 m), weight 66.679 kg (147 lb), SpO2 98 %. Physical Exam  Constitutional: She is oriented to person, place, and time. She appears well-developed and well-nourished. No distress.  She is uncomfortable with NG, but looks good for 89 and all her issues.  HENT:  Head: Normocephalic.  NG in place  Eyes: Right eye exhibits  no discharge. Left eye exhibits no discharge. No scleral icterus.  Neck: Normal range of motion. Neck supple. No JVD present. No  tracheal deviation present. No thyromegaly present.  Cardiovascular: Normal rate, normal heart sounds and intact distal pulses.   No murmur heard. Respiratory: Effort normal and breath sounds normal. No respiratory distress. She has no wheezes. She has no rales. She exhibits no tenderness.  GI: Soft. She exhibits no distension and no mass. There is no tenderness. There is no rebound and no guarding.  Few BS, small hernia just upper left side of her abdominal incision.   Large midline surgical scar, from just below the xyphoid to the base of the abdomen.  Musculoskeletal: She exhibits no edema.  Neurological: She is alert and oriented to person, place, and time. No cranial nerve deficit.  Skin: Skin is warm and dry. No rash noted. She is not diaphoretic. No erythema. No pallor.  Psychiatric: She has a normal mood and affect. Her behavior is normal. Judgment and thought content normal.    Assessment/Plan: Recurrent nausea and vomiting Multiple abdominal surgeries S/p gastric ulcer with perforation and GJ procedure S/p perforated chronic marginal ulcer of her GJ anastomosis. She had a resection of GJ anastomosis with primary jejunojejunal anastomosis , repair of gastric ulcer, repair of incisional hernia, EGD by Dr. Rivka Barbara and Dr; Ophelia Shoulder at [facility].  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 Kieth Brightly has reviewed the history of the patient. It is his opinion there are 3 options.  Medical management of symptoms.  GJ tube which will not really relieve her symptoms, but allow feeding.  Final option is to do a gastric bypass procedure.  She would need to be willing to go forward with another big surgery and be cleared medically to go forward to do this.   Dr. Kieth Brightly will plan to meet with the patient and her daughter tomorrow.  Mrs. Chaban says her daughter hopes to be here by 10 AM tomorrow.    Souleymane Saiki 09/20/2015, 2:53 PM

## 2015-09-20 NOTE — Care Management Note (Signed)
Case Management Note  Patient Details  Name: Shannon [name] MRN: [id number] Date of Birth: 25-Dec-1923  Subjective/Objective:     Pt admitted with HCAP               Action/Plan:    Plan to dc home with daughter and Alvis Lemmings for [facility] needs.   Expected Discharge Date:  09/15/15               Expected Discharge Plan:     In-House Referral:     Discharge planning Services     Post Acute Care Choice:    Choice offered to:  Patient, Adult Children  DME Arranged:    DME Agency:     HH Arranged:  PT HH Agency:  [place]  Status of Service:     Medicare Important Message Given:  Yes Date Medicare IM Given:    Medicare IM give by:    Date Additional Medicare IM Given:    Additional Medicare Important Message give by:     If discussed at [place] of Stay Meetings, dates discussed:    Additional CommentsPurcell Mouton, RN 09/20/2015, 12:32 PM

## 2015-09-20 NOTE — Progress Notes (Signed)
Physical Therapy Treatment Patient Details Name: Shannon [name] MRN: [id number] DOB: Jun 05, 1923 Today's Date: 09/20/2015    History of Present Illness 82 yo female admitted with HCAP and Gastic outlet obstruction 2* pyloric stenosis. Hx of HTN, Osteomyelitis of 3rd toe on R foot,  Afib, MI, ventral hernia, CVA, DM, COPD, arhtritis, PVD, neuropathy.    PT Comments    Assisted pt onto bsc then back to bed at her request. Pt declined ambulation on today due to not feeling well. Will continue to follow and progress activity as tolerated  Follow Up Recommendations  Home health PT;Supervision/Assistance - 24 hour     Equipment Recommendations  None recommended by PT    Recommendations for Other Services       Precautions / Restrictions Precautions Precautions: Fall Precaution Comments: NG tube Restrictions Weight Bearing Restrictions: No    Mobility  Bed Mobility Overal bed mobility: Needs Assistance Bed Mobility: Sit to Supine       Sit to supine: Min guard      Transfers Overall transfer level: Needs assistance   Transfers: Sit to/from Stand Sit to Stand: Min assist Stand pivot transfers: Min assist       General transfer comment: Assist to rise, stabilize, control descent. Stand pivot, recliner to bed-pt used armrest of BSC for support during pivot. Then she held onto IV pole with 2 hands while backing up to bed to sit down.   Ambulation/Gait             General Gait Details: Pt declined ambulation on today   Stairs            Wheelchair Mobility    Modified Rankin (Stroke Patients Only)       Balance                                    Cognition Arousal/Alertness: Awake/alert Behavior During Therapy: WFL for tasks assessed/performed Overall Cognitive Status: Within Functional Limits for tasks assessed                      Exercises      General Comments        Pertinent Vitals/Pain Pain Assessment: No/denies  pain    Home Living                      Prior Function            PT Goals (current goals can now be found in the care plan section) Progress towards PT goals: Progressing toward goals (very slowly)    Frequency  Min 3X/week    PT Plan Current plan remains appropriate    Co-evaluation             End of Session   Activity Tolerance: Patient limited by fatigue Patient left: in bed;with call bell/phone within reach;with bed alarm set     Time: [id number] PT Time Calculation (min) (ACUTE ONLY): 10 min  Charges:  $Therapeutic Activity: 8-22 mins                    G Codes:      Weston Anna, MPT Pager: [phone]

## 2015-09-21 ENCOUNTER — Encounter (HOSPITAL_BASED_OUTPATIENT_CLINIC_OR_DEPARTMENT_OTHER): Payer: Medicare Other

## 2015-09-21 DIAGNOSIS — G43A0 Cyclical vomiting, not intractable: Secondary | ICD-10-CM

## 2015-09-21 LAB — GLUCOSE, CAPILLARY
Glucose-Capillary: 123 mg/dL — ABNORMAL HIGH (ref 65–99)
Glucose-Capillary: 85 mg/dL (ref 65–99)
Glucose-Capillary: 72 mg/dL (ref 65–99)
Glucose-Capillary: 91 mg/dL (ref 65–99)
Glucose-Capillary: 106 mg/dL — ABNORMAL HIGH (ref 65–99)

## 2015-09-21 LAB — BASIC METABOLIC PANEL
Anion gap: 16 — ABNORMAL HIGH (ref 5–15)
BUN: 7 mg/dL (ref 6–20)
CO2: 20 mmol/L — ABNORMAL LOW (ref 22–32)
Calcium: 8.7 mg/dL — ABNORMAL LOW (ref 8.9–10.3)
Chloride: 101 mmol/L (ref 101–111)
Creatinine, Ser: 1.19 mg/dL — ABNORMAL HIGH (ref 0.44–1.00)
GFR calc Af Amer: 45 mL/min — ABNORMAL LOW (ref >60)
GFR calc non Af Amer: 39 mL/min — ABNORMAL LOW (ref >60)
Glucose, Bld: 36 mg/dL — CL (ref 65–99)
Potassium: 2.9 mmol/L — ABNORMAL LOW (ref 3.5–5.1)
Sodium: 137 mmol/L (ref 135–145)

## 2015-09-21 LAB — CBC
HCT: 27 % — ABNORMAL LOW (ref 36.0–46.0)
Hemoglobin: 9.6 g/dL — ABNORMAL LOW (ref 12.0–15.0)
MCH: 33.6 pg (ref 26.0–34.0)
MCHC: 35.6 g/dL (ref 30.0–36.0)
MCV: 94.4 fL (ref 78.0–100.0)
Platelets: 333 10*3/uL (ref 150–400)
RBC: 2.86 MIL/uL — ABNORMAL LOW (ref 3.87–5.11)
RDW: 17.9 % — ABNORMAL HIGH (ref 11.5–15.5)
WBC: 10.5 10*3/uL (ref 4.0–10.5)

## 2015-09-21 LAB — HEPARIN LEVEL (UNFRACTIONATED): Heparin Unfractionated: 0.33 IU/mL (ref 0.30–0.70)

## 2015-09-21 LAB — MAGNESIUM: Magnesium: 1.6 mg/dL — ABNORMAL LOW (ref 1.7–2.4)

## 2015-09-21 MED ORDER — DEXTROSE 50 % IV SOLN
1.0000 | Freq: Once | INTRAVENOUS | Status: AC
  Administered 2015-09-21: 50 mL via INTRAVENOUS
  Filled 2015-09-21: qty 50

## 2015-09-21 MED ORDER — POTASSIUM CHLORIDE 10 MEQ/100ML IV SOLN
10.0000 meq | INTRAVENOUS | Status: AC
  Administered 2015-09-21 (×4): 10 meq via INTRAVENOUS
  Filled 2015-09-21 (×4): qty 100

## 2015-09-21 MED ORDER — NITROGLYCERIN 2 % TD OINT
0.5000 [in_us] | TOPICAL_OINTMENT | Freq: Two times a day (BID) | TRANSDERMAL | Status: DC
  Administered 2015-09-21 – 2015-09-30 (×17): 0.5 [in_us] via TOPICAL

## 2015-09-21 MED ORDER — POTASSIUM CHLORIDE 20 MEQ/15ML (10%) PO SOLN
40.0000 meq | Freq: Every day | ORAL | Status: DC
Start: 2015-09-21 — End: 2015-09-22
  Administered 2015-09-21: 40 meq via ORAL
  Filled 2015-09-21: qty 30

## 2015-09-21 NOTE — Progress Notes (Signed)
Progress Note   Subjective  Shannon [name] Is a 82 year old African-American female who was admitted to the hospital on 09/11/15 for repeat symptoms of GOO. Patient currently has NG tube in place. Surgery was consulted yesterday regarding possible GJ tube versus other option. Per their note there is discussion of possible gastric bypass if patient is cleared medically, though they were going to discuss this with the patient's daughter this morning.  Today, the patient tells me that she continues to be irritated by the NG tube. She is "okay", but is eager to get things moving and be able to return home soon. She is aware of plans for the surgeon to discuss next steps in treatment with her daughter this morning. She is eager to here what they decide. She continues to deny having a bowel movement, though she was only able to eat clear liquids yesterday. She denies any new complaints.   Objective   Vital signs in last 24 hours: Temp:  [98 F (36.7 C)-99.4 F (37.4 C)] 99.4 F (37.4 C) (06/20 0513) Pulse Rate:  [60-74] 60 (06/20 0513) Resp:  [18-20] 20 (06/20 0513) BP: (142-193)/(57-74) 145/57 mmHg (06/20 0627) SpO2:  [94 %-98 %] 94 % (06/20 0513) Last BM Date: 09/17/15 General: Pleasant African American female in NAD with NG tube in place Heart: Regular rate and rhythm; no murmurs Lungs: Respirations even and unlabored, lungs CTA bilaterally Abdomen: Minimal distension and tenderness in the epigastrum;Normal bowel sounds. Extremities: Without edema. Neurologic: Alert and oriented, grossly normal neurologically. Psych: Cooperative. Normal mood and affect.  Intake/Output from previous day: 06/19 0701 - 06/20 0700 In: 1110 [P.O.:710; IV Piggyback:400] Out: 1500 [Urine:400; Emesis/NG output:1100]  Lab Results:  Recent Labs  09/19/15 0418 09/20/15 0446 09/21/15 0451  WBC 10.9* 14.2* 10.5  HGB 10.3* 10.1* 9.6*  HCT 29.3* 28.3* 27.0*  PLT 327 326 333   BMET  Recent Labs  09/21/15 0451  NA 137  K 2.9*  CL 101  CO2 20*  GLUCOSE 36*  BUN 7  CREATININE 1.19*  CALCIUM 8.7*   LFT No results for input(s): PROT, ALBUMIN, AST, ALT, ALKPHOS, BILITOT, BILIDIR, IBILI in the last 72 hours. PT/INR No results for input(s): LABPROT, INR in the last 72 hours.   Assessment / Plan:   Impression: 1. Nausea and vomiting in patient with history of the same: Complicated patient with multiple abdominal surgeries, history of small bowel obstructions, pyloric stenosis, functional gastric outlet obstruction; Candida esophagitis on latest EGD on 07/08/2015; recent admission with improvement of symptoms after NG tube-patient returned hospital 4 days after last discharge with continued symptoms; CT abdomen and pelvis at time of admission showed small left pleural effusion with findings concerning for bibasilar pneumonia, severe gastric distention likely related to chronic gastric dysmotility or partial gastric outlet obstruction, mild haziness of the proximal duodenum concerning for duodenitis with no evidence of small bowel obstruction, esophagitis and colonic diverticulosis without active inflammatory changes; patient improved after NG tube placement and removal of almost 3000 mL of gastric contents; EGD on 09/15/15 revealed moderate stenosis in the previous surgical anastomosis which was dilated to 15 mm, there were also food contents found in the stomach indicating possible gastroparesis. Patient was started on Reglan 3 times a day on 09/15/2015 as well as erythromycin 250 mg IV 4 times daily-pt improved for 2 days, but when started back on soft food diet on 6/16 she had repeat of symptoms and she was again made NPO and NG tube was replaced-currently  improved after over 2000cc have again been removed- surgery was consulted 09/20/15 with discussion of GJ tube versus gastric bypass versus other option. Plans are to discuss this with the patient's daughter this morning. 2. Constipation:  Controlled on twice a day MiraLAX at home, per daughter this causes her to be nauseous and having a lot of gas.  Plan: 1. Continue Reglan and Erythromycin as prescribed. 2. Continue supportive measures 3. Continue clear liquid diet 4. Continue to appreciate surgery's recommendations going forward 5. Will discuss above with Dr. Hilarie Fredrickson, please await any further recommendations  Thank you for your kind consultation, we will continue to follow  Principal Problem:   HCAP (healthcare-associated pneumonia) Active Problems:   Essential hypertension, benign   DM (diabetes mellitus) type II uncontrolled, periph vascular disorder (HCC)   Hypothyroidism   Depression   Anemia of chronic disease   Gastric distention   Acute renal failure superimposed on stage 3 chronic kidney disease (HCC)   Atrial fibrillation (HCC)   N&V (nausea and vomiting)   Diabetic osteomyelitis (HCC)   PVD (peripheral vascular disease) (HCC)   Hyperkalemia   Osteomyelitis of ankle or foot (HCC)   Pressure ulcer   Pyloric stenosis   LOS: 9 days   [name]  09/21/2015, 9:59 AM  Pager # [phone]

## 2015-09-21 NOTE — Progress Notes (Signed)
Progress Note: General Surgery Service   Subjective: Continues to have NG drainage, intermittent nausea  Objective: Vital signs in last 24 hours: Temp:  [98 F (36.7 C)-99.4 F (37.4 C)] 99.4 F (37.4 C) (06/20 0513) Pulse Rate:  [60-74] 60 (06/20 0513) Resp:  [18-20] 20 (06/20 0513) BP: (142-193)/(57-74) 145/57 mmHg (06/20 0627) SpO2:  [94 %-98 %] 94 % (06/20 0513) Last BM Date: 09/17/15  Intake/Output from previous day: 06/19 0701 - 06/20 0700 In: 1110 [P.O.:710; IV Piggyback:400] Out: 1500 [Urine:400; Emesis/NG output:1100] Intake/Output this shift: Total I/O In: 120 [P.O.:120] Out: 150 [Urine:150]  Abd: mild distenstion  Extremities: no edema  Neuro: AOx4  Lab Results: CBC   Recent Labs  09/20/15 0446 09/21/15 0451  WBC 14.2* 10.5  HGB 10.1* 9.6*  HCT 28.3* 27.0*  PLT 326 333   BMET  Recent Labs  09/21/15 0451  NA 137  K 2.9*  CL 101  CO2 20*  GLUCOSE 36*  BUN 7  CREATININE 1.19*  CALCIUM 8.7*   PT/INR No results for input(s): LABPROT, INR in the last 72 hours. ABG No results for input(s): PHART, HCO3 in the last 72 hours.  Invalid input(s): PCO2, PO2  Studies/Results:  Anti-infectives: Anti-infectives    Start     Dose/Rate Route Frequency Ordered Stop   09/20/15 1200  Ampicillin-Sulbactam (UNASYN) 3 g in sodium chloride 0.9 % 100 mL IVPB     3 g 100 mL/hr over 60 Minutes Intravenous Every 6 hours 09/20/15 0829     09/16/15 1200  erythromycin 250 mg in sodium chloride 0.9 % 100 mL IVPB  Status:  Discontinued     250 mg 100 mL/hr over 60 Minutes Intravenous Every 6 hours 09/16/15 0846 09/17/15 1002   09/15/15 1800  ampicillin-sulbactam (UNASYN) 1.5 g in sodium chloride 0.9 % 50 mL IVPB  Status:  Discontinued     1.5 g 100 mL/hr over 30 Minutes Intravenous Every 6 hours 09/15/15 1103 09/20/15 0829   09/15/15 1715  erythromycin (E-MYCIN) tablet 250 mg  Status:  Discontinued     250 mg Oral 3 times daily before meals & bedtime  09/15/15 1656 09/16/15 0846   09/13/15 0600  vancomycin (VANCOCIN) IVPB 750 mg/150 ml premix  Status:  Discontinued     750 mg 150 mL/hr over 60 Minutes Intravenous Every 24 hours 09/12/15 0546 09/15/15 1036   09/12/15 2200  ceFEPIme (MAXIPIME) 1 g in dextrose 5 % 50 mL IVPB  Status:  Discontinued     1 g 100 mL/hr over 30 Minutes Intravenous Daily at bedtime 09/12/15 0410 09/15/15 1036   09/12/15 0430  ceFEPIme (MAXIPIME) 1 g in dextrose 5 % 50 mL IVPB     1 g 100 mL/hr over 30 Minutes Intravenous  Once 09/12/15 0427 09/12/15 0540   09/12/15 0100  vancomycin (VANCOCIN) IVPB 1000 mg/200 mL premix     1,000 mg 200 mL/hr over 60 Minutes Intravenous  Once 09/12/15 0046 09/12/15 0400   09/12/15 0100  piperacillin-tazobactam (ZOSYN) IVPB 3.375 g     3.375 g 100 mL/hr over 30 Minutes Intravenous  Once 09/12/15 0046 09/12/15 0222      Medications: Scheduled Meds: . amiodarone  100 mg Oral Daily  . ampicillin-sulbactam (UNASYN) IV  3 g Intravenous Q6H  . carvedilol  3.125 mg Oral BID WC  . chlorhexidine  15 mL Mouth/Throat BID  . cycloSPORINE  1 drop Both Eyes BID  . fluticasone  2 spray Each Nare Daily  . gabapentin  300 mg Oral Daily  . gabapentin  600 mg Oral 2 times per day  . latanoprost  1 drop Both Eyes QHS  . levothyroxine  44 mcg Intravenous Daily  . metoCLOPramide (REGLAN) injection  5 mg Intravenous Q8H  . mometasone-formoterol  2 puff Inhalation BID  . nitroGLYCERIN  0.5 inch Topical Q6H  . pantoprazole  40 mg Oral Daily  . polyvinyl alcohol  1 drop Both Eyes QID  . potassium chloride  40 mEq Oral Daily  . sertraline  100 mg Oral Daily   Continuous Infusions: . heparin 650 Units/hr (09/20/15 0546)   PRN Meds:.acetaminophen, albuterol, bisacodyl, diclofenac sodium, hydrALAZINE, lidocaine, lip balm, metoprolol, ondansetron (ZOFRAN) IV, phenol  Assessment/Plan: Patient Active Problem List   Diagnosis Date Noted  . Pyloric stenosis   . Pressure ulcer 09/15/2015  .  Hyperkalemia 09/12/2015  . Osteomyelitis of ankle or foot ([place])   . PVD (peripheral vascular disease) ([place])   . Diabetic osteomyelitis ([place]) 09/02/2015  . Emesis   . Adjustment disorder with anxiety 08/29/2015  . Diabetic ulcer of toe of right foot associated with type 2 diabetes mellitus, with necrosis of muscle ([place])   . Diverticulitis of large intestine without perforation or abscess without bleeding   . Colitis 08/26/2015  . Atrial fibrillation ([place]) 08/26/2015  . N&V (nausea and vomiting)   . Acute on chronic diastolic heart failure ([place]) 08/02/2015  . Esophageal candidiasis ([place])   . Goals of care, counseling/discussion   . Encounter for hospice care discussion   . Dyspnea   . SOB (shortness of breath)   . Acute renal failure superimposed on stage 3 chronic kidney disease ([place])   . Encounter for long-term (current) use of other high-risk medications   . Respiratory distress 07/06/2015  . Atrial flutter with rapid ventricular response ([place]) 07/06/2015  . Dysphagia 07/05/2015  . Asthma with allergic rhinitis 06/04/2015  . Hypertensive heart disease 03/18/2015  . S/P exploratory laparotomy 01/20/15 01/28/2015  . Malnutrition of moderate degree 01/28/2015  . Syncope 01/27/2015  . HCAP (healthcare-associated pneumonia) 01/23/2015  . Fever, unspecified 01/22/2015  . Bradycardia 01/21/2015  . Hypotension 01/21/2015  . Palliative care encounter   . DNR (do not resuscitate) discussion   . Protein-calorie malnutrition, severe 01/12/2015  . Elevated troponin 01/12/2015  . HTN (hypertension), uncontrolled 01/12/2015  . AP (abdominal pain) 01/10/2015  . SBO (small bowel obstruction) ([place]) 01/10/2015  . SIADH (syndrome of inappropriate ADH production) ([place]) 01/10/2015  . Cerebrovascular disease 07/14/2014  . Diabetic polyneuropathy associated with type 2 diabetes mellitus ([place]) 07/14/2014  . Hyperlipidemia 07/14/2014  . Gastric distention   . Gastric outlet obstruction 05/21/2014   . Unstable gait 04/29/2014  . Hyponatremia 03/23/2014  . Acute encephalopathy 03/22/2014  . Slurred speech   . Chest pain   . Gastroparesis   . Slow transit constipation 02/03/2014  . Carotid stenosis 02/03/2014  . Demand ischemia ([place]) 01/21/2014  . Gastric bezoar 01/20/2014  . Anemia of chronic disease 01/18/2014  . CKD (chronic kidney disease) stage 3, GFR 30-59 ml/min 01/18/2014  . Depression 09/29/2013  . Perforated ulcer ([place]) 09/29/2013  . Essential hypertension, benign 09/22/2013  . GERD (gastroesophageal reflux disease) 09/22/2013  . Dyslipidemia 09/22/2013  . DM (diabetes mellitus) type II uncontrolled, periph vascular disorder ([place]) 09/22/2013  . A-fib ([place]) 09/22/2013  . CVA (cerebral vascular accident) ([place]) 09/22/2013  . Diabetic neuropathy ([place]) 09/22/2013  . Hypothyroidism 09/22/2013  . Chronic gastrojejunal ulcer with perforation ([place]) 09/07/2013  . Diabetes (  [place]) 09/07/2013  . Accelerated hypertension 09/07/2013  . Systemic lupus erythematosus (SLE) inhibitor 09/07/2013  . Other specified abnormal immunological findings in serum 09/07/2013  I discussed with the daughter and patient at length the pathophysiology in play at this time as well as options for treatment including GJ tube, Gastrojejunal anastomosis, antrectomy with gastrojejunal reconstruction. Due to her history of multiple abdominal surgeries I thought it was unlikely to be able to complete the latter 2 procedures laparoscopically and would most likely (>70%) require a laparotomy. Due to her previous issues with major surgery (2015 and 2016) leading to multiple weeks in the hospital and rehab facilities, she is concerned about major surgery and has elected to proceed with GJ tube placement. We discussed on placement of the tube would allow tube feeds distal to the stenotic area and allow some drainage of the stomach, however, we also discussed how due to the chronicity of the issue she may continue to have  poor draining of the stomach and continued nausea.   -hold heparin in am -plan for GJ tube placement in am -continue NG tube placement   LOS: 9 days   Mickeal Skinner, MD Pg# [phone] [facility]

## 2015-09-21 NOTE — Progress Notes (Signed)
CRITICAL VALUE ALERT  Critical value received: Glucose of 36  Date of notification:  09/21/2015   Time of notification:  5:46 AM   Critical value read back:Yes.    Nurse who received alert:  Erling Conte, RN  MD notified (1st page):  Baltazar Najjar  Time of first page:  5:48 AM  MD notified (2nd page): N/A  Time of second page: N/A   Responding MD:  Baltazar Najjar   Time MD responded:  5:50 AM

## 2015-09-21 NOTE — Progress Notes (Signed)
[place] for Apixabab --> heparin Indication: Atrial Fibrillation  No Known Allergies  Patient Measurements: Height: 5\' 6"  (167.6 cm) Weight: 147 lb (66.679 kg) IBW/kg (Calculated) : 59.3 Heparin Dosing Weight: 66kg  Vital Signs: Temp: 99.4 F (37.4 C) (06/20 0513) Temp Source: Oral (06/20 0513) BP: 145/57 mmHg (06/20 0627) Pulse Rate: 60 (06/20 0513)  Labs:  Recent Labs  09/19/15 0418  09/20/15 0446 09/20/15 1648 09/21/15 0451  HGB 10.3*  --  10.1*  --  9.6*  HCT 29.3*  --  28.3*  --  27.0*  PLT 327  --  326  --  333  APTT 64*  --   --   --   --   HEPARINUNFRC 0.40  < > 0.33 0.44 0.33  CREATININE  --   --   --   --  1.19*  < > = values in this interval not displayed.  Estimated Creatinine Clearance: 29.4 mL/min (by C-G formula based on Cr of 1.19).  Assessment: 11 yoF on chronic apixaban for hx atrial fibrillation hospitalized for GOO, transitioned prior to admission apixaban to IV heparin at admission for EGD on 6/14.  She required dilation of moderate stenosis and suction of retained fluid and small food fragments. GI ok with resuming anticoagulation 6/15.  Patient initially tolerated diet, but again having abd distention and nausea requiring NGT placement.  Pharmacy consulted to resume heparin gtt again.    PTA apixaban 2.5mg  PO BID  09/21/2015  Heparin level: 0.33, remains therapeutic   CBC: Hgb low/stable, Pltc WNL  No bleeding documented   Goal of Therapy:  Monitor platelets by anticoagulation protocol: Yes  Heparin level 0.3-0.6 APTT 66-102 sec   Plan:   Continue heparin at 650 units/hr  Daily heparin level, CBC  Monitor for s/s bleeding  Follow up surgical plans and long-term anticoagulation plans   Gretta Arab PharmD, BCPS Pager [phone] 09/21/2015 7:02 AM

## 2015-09-21 NOTE — Progress Notes (Signed)
PROGRESS NOTE    Shannon [name]  ZOX:[id number] DOB: 1923/09/25 DOA: 09/11/2015 PCP: Kirt Boys, DO   Brief Narrative:  HPI on 09/12/2015 by Dr. Marcial Pacas Shannon [name] is a medically complex 82 y.o. female with medical history significant for hypertension, hyperlipidemia, hypothyroidism, peripheral arterial disease with ischemic toe complicated by osteomyelitis, and chronic gastric distention with recurrent nausea and vomiting who presents to the ED for evaluation of abdominal pain with nausea and vomiting of 2 days' duration. Patient had a recent admission, just discharged on 09/05/2015 following management of abdominal pain with nausea and vomiting. She had an NG tube placed at that time and was started on liquid diet, essentially improving spontaneously and tolerating full diet. She was also noted to have an ischemic third toe on the right foot secondary to PAD and workup revealed underlying osteomyelitis. Surgery was advised for management of this, however patient declined. She was discharged with a one-month course of Augmentin. She had initially done well back at home before the insidious development of recurrent abdominal pain with nausea and vomiting. She reports the vomitus as nonbloody and nonbilious. There's been no diarrhea; in fact she has not had a bowel movement in 2-3 days. She denies chest pain or palpitations, but does endorse dyspnea with minimal exertion. She denies any significant cough.  Interim history Patient found to have pyloric stenosis. Had EGD with dilatation. Patient was also treated for healthcare associated pneumonia. Plan is to have postpyloric feeding tube placed today.  Assessment & Plan   HCAP  -likely aspiration, from gastric outlet obstruction  -Currently appears nontoxic currently afebrile -Was treated with vancomycin and cefepime and transitioned to Unasyn -cultures negative  Gastric outlet obstruction due to pyloric stenosis -Initially there was  improvement after EGD and dilatation but still not back to baseline. -On 09/18/2015 patient has re-insertion of NG tube due to abdominal distention with significant output. -As per GI patient may need postpyloric feeding tube placement if she is unable to tolerate oral diet. -Pending further recommendations from surgery  Right third toe gangrene  -On Augmentin as an outpatient and Unasyn in the hospital, refused amputation last admission, again refuses, confirmed with her daughter.  -Suppressive antibiotic course ongoing for 30 days.  -After antibiotic course she wants to continue with wound care and supportive care.  -If worsening then daughter requested to consider comfort care. -Last day antibiotics 10/07/2015.  Dyslipidemia -On statin.  Depression  -On Zoloft.  Hypothyroidism  -recently Synthroid increased to 88 g few days ago, repeat TSH in 4 weeks from now. -Change to IV Synthroid at present.  GERD -On PPI  Paroxysmal atrial fibrillation with RVR -CHADSVASC 5 -Continue amiodarone and transitioned to IV heparin again in the setting of patient remaining nothing by mouth.  History of asthma/COPD -Stable.  Hypokalemia  -Replaced, continue to monitor BMP  DVT Prophylaxis    Code Status: partial code, DNR, DNI  Family Communication: None at bedside  Disposition Plan: Admitted. Pending further recommendations from surgery   Consultants Gastroenterology General surgery  Procedures  EGD  Antibiotics   Anti-infectives    Start     Dose/Rate Route Frequency Ordered Stop   09/20/15 1200  Ampicillin-Sulbactam (UNASYN) 3 g in sodium chloride 0.9 % 100 mL IVPB     3 g 100 mL/hr over 60 Minutes Intravenous Every 6 hours 09/20/15 0829     09/16/15 1200  erythromycin 250 mg in sodium chloride 0.9 % 100 mL IVPB  Status:  Discontinued  250 mg 100 mL/hr over 60 Minutes Intravenous Every 6 hours 09/16/15 0846 09/17/15 1002   09/15/15 1800  ampicillin-sulbactam (UNASYN)  1.5 g in sodium chloride 0.9 % 50 mL IVPB  Status:  Discontinued     1.5 g 100 mL/hr over 30 Minutes Intravenous Every 6 hours 09/15/15 1103 09/20/15 0829   09/15/15 1715  erythromycin (E-MYCIN) tablet 250 mg  Status:  Discontinued     250 mg Oral 3 times daily before meals & bedtime 09/15/15 1656 09/16/15 0846   09/13/15 0600  vancomycin (VANCOCIN) IVPB 750 mg/150 ml premix  Status:  Discontinued     750 mg 150 mL/hr over 60 Minutes Intravenous Every 24 hours 09/12/15 0546 09/15/15 1036   09/12/15 2200  ceFEPIme (MAXIPIME) 1 g in dextrose 5 % 50 mL IVPB  Status:  Discontinued     1 g 100 mL/hr over 30 Minutes Intravenous Daily at bedtime 09/12/15 0410 09/15/15 1036   09/12/15 0430  ceFEPIme (MAXIPIME) 1 g in dextrose 5 % 50 mL IVPB     1 g 100 mL/hr over 30 Minutes Intravenous  Once 09/12/15 0427 09/12/15 0540   09/12/15 0100  vancomycin (VANCOCIN) IVPB 1000 mg/200 mL premix     1,000 mg 200 mL/hr over 60 Minutes Intravenous  Once 09/12/15 0046 09/12/15 0400   09/12/15 0100  piperacillin-tazobactam (ZOSYN) IVPB 3.375 g     3.375 g 100 mL/hr over 30 Minutes Intravenous  Once 09/12/15 0046 09/12/15 0222      Subjective:   Shannon [name] seen and examined today.  Patient is waiting for more information so she and her daughter can make a decision regarding surgery.  She currently denies chest pain, shortness of breath, dizziness, headache.  Objective:   Filed Vitals:   09/20/15 2056 09/20/15 2141 09/21/15 0513 09/21/15 0627  BP: 142/65  170/67 145/57  Pulse: 60  60   Temp: 98.8 F (37.1 C)  99.4 F (37.4 C)   TempSrc: Oral  Oral   Resp: 18  20   Height:      Weight:      SpO2: 97% 96% 94%     Intake/Output Summary (Last 24 hours) at 09/21/15 1310 Last data filed at 09/21/15 1053  Gross per 24 hour  Intake    930 ml  Output   1650 ml  Net   -720 ml   Filed Weights   09/11/15 2303 09/12/15 0241 09/15/15 1412  Weight: 68.04 kg (150 lb) 66.724 kg (147 lb 1.6 oz) 66.679 kg  (147 lb)    Exam  General: Well developed, well nourished, no distress  HEENT: NCAT, mucous membranes moist. NG tube   Cardiovascular: S1 S2 auscultated, no murmur, irregular   Respiratory: Clear to auscultation  Abdomen: Soft, mild epigastric TTP, mildly distended, + bowel sounds  Extremities: warm dry without cyanosis clubbing or edema  Neuro: AAOx3, nonfocal   Psych: Normal affect and demeanor, pleasant    Data Reviewed: I have personally reviewed following labs and imaging studies  CBC:  Recent Labs Lab 09/17/15 0415 09/18/15 0437 09/19/15 0418 09/20/15 0446 09/21/15 0451  WBC 10.8* 11.3* 10.9* 14.2* 10.5  NEUTROABS 2.8  --  2.9  --   --   HGB 8.8* 9.6* 10.3* 10.1* 9.6*  HCT 25.4* 27.2* 29.3* 28.3* 27.0*  MCV 93.0 94.8 93.6 94.0 94.4  PLT 268 302 327 326 333   Basic Metabolic Panel:  Recent Labs Lab 09/15/15 0456 09/16/15 0322 09/17/15 0415 09/18/15 0437 09/19/15  0418 09/21/15 0451  NA 140 137 136 138  --  137  K 4.0 3.5 3.7 3.6  --  2.9*  CL 112* 110 109 109  --  101  CO2 17* 22 22 23   --  20*  GLUCOSE 60* 83 81 61*  --  36*  BUN 10 7 <5* 5*  --  7  CREATININE 1.16* 1.01* 1.04* 0.99  --  1.19*  CALCIUM 8.4* 8.0* 8.2* 8.5*  --  8.7*  MG  --  1.7 1.5* 1.8 1.6*  --   PHOS  --  1.8*  --   --   --   --    GFR: Estimated Creatinine Clearance: 29.4 mL/min (by C-G formula based on Cr of 1.19). Liver Function Tests:  Recent Labs Lab 09/17/15 0415  AST 23  ALT 16  ALKPHOS 40  BILITOT 0.6  PROT 5.8*  ALBUMIN 2.5*   No results for input(s): LIPASE, AMYLASE in the last 168 hours. No results for input(s): AMMONIA in the last 168 hours. Coagulation Profile: No results for input(s): INR, PROTIME in the last 168 hours. Cardiac Enzymes: No results for input(s): CKTOTAL, CKMB, CKMBINDEX, TROPONINI in the last 168 hours. BNP (last 3 results) No results for input(s): PROBNP in the last 8760 hours. HbA1C: No results for input(s): HGBA1C in the last  72 hours. CBG:  Recent Labs Lab 09/15/15 1805 09/17/15 0025 09/21/15 0636 09/21/15 0736 09/21/15 1202  GLUCAP 117* 93 123* 85 72   Lipid Profile: No results for input(s): CHOL, HDL, LDLCALC, TRIG, CHOLHDL, LDLDIRECT in the last 72 hours. Thyroid Function Tests: No results for input(s): TSH, T4TOTAL, FREET4, T3FREE, THYROIDAB in the last 72 hours. Anemia Panel: No results for input(s): VITAMINB12, FOLATE, FERRITIN, TIBC, IRON, RETICCTPCT in the last 72 hours. Urine analysis:    Component Value Date/Time   COLORURINE YELLOW 09/12/2015 0036   APPEARANCEUR CLEAR 09/12/2015 0036   LABSPEC 1.025 09/12/2015 0036   PHURINE 6.5 09/12/2015 0036   GLUCOSEU NEGATIVE 09/12/2015 0036   HGBUR NEGATIVE 09/12/2015 0036   BILIRUBINUR NEGATIVE 09/12/2015 0036   KETONESUR NEGATIVE 09/12/2015 0036   PROTEINUR 100* 09/12/2015 0036   UROBILINOGEN 0.2 01/27/2015 2308   NITRITE NEGATIVE 09/12/2015 0036   LEUKOCYTESUR NEGATIVE 09/12/2015 0036   Sepsis Labs: @LABRCNTIP (procalcitonin:4,lacticidven:4)  ) Recent Results (from the past 240 hour(s))  Urine culture     Status: Abnormal   Collection Time: 09/12/15 12:36 AM  Result Value Ref Range Status   Specimen Description URINE, CLEAN CATCH  Final   Special Requests NONE  Final   Culture (A)  Final    8,000 COLONIES/mL INSIGNIFICANT GROWTH Performed at [facility]    Report Status 09/13/2015 FINAL  Final  Culture, blood (Routine X 2) w Reflex to ID Panel     Status: None   Collection Time: 09/12/15  1:23 AM  Result Value Ref Range Status   Specimen Description BLOOD RIGHT ANTECUBITAL  Final   Special Requests BOTTLES DRAWN AEROBIC AND ANAEROBIC 5CC  Final   Culture   Final    NO GROWTH 5 DAYS Performed at [facility]    Report Status 09/17/2015 FINAL  Final  Culture, blood (Routine X 2) w Reflex to ID Panel     Status: None   Collection Time: 09/12/15  1:52 AM  Result Value Ref Range Status   Specimen Description BLOOD  LEFT HAND  Final   Special Requests BOTTLES DRAWN AEROBIC AND ANAEROBIC  Final  Culture   Final    NO GROWTH 5 DAYS Performed at [facility]    Report Status 09/17/2015 FINAL  Final  MRSA PCR Screening     Status: None   Collection Time: 09/14/15  3:25 PM  Result Value Ref Range Status   MRSA by PCR NEGATIVE NEGATIVE Final    Comment:        The GeneXpert MRSA Assay (FDA approved for NASAL specimens only), is one component of a comprehensive MRSA colonization surveillance program. It is not intended to diagnose MRSA infection nor to guide or monitor treatment for MRSA infections.       Radiology Studies: No results found.   Scheduled Meds: . amiodarone  100 mg Oral Daily  . ampicillin-sulbactam (UNASYN) IV  3 g Intravenous Q6H  . carvedilol  3.125 mg Oral BID WC  . chlorhexidine  15 mL Mouth/Throat BID  . cycloSPORINE  1 drop Both Eyes BID  . fluticasone  2 spray Each Nare Daily  . gabapentin  300 mg Oral Daily  . gabapentin  600 mg Oral 2 times per day  . latanoprost  1 drop Both Eyes QHS  . levothyroxine  44 mcg Intravenous Daily  . metoCLOPramide (REGLAN) injection  5 mg Intravenous Q8H  . mometasone-formoterol  2 puff Inhalation BID  . nitroGLYCERIN  0.5 inch Topical Q6H  . pantoprazole  40 mg Oral Daily  . polyvinyl alcohol  1 drop Both Eyes QID  . potassium chloride  40 mEq Oral Daily  . sertraline  100 mg Oral Daily   Continuous Infusions: . heparin 650 Units/hr (09/20/15 0546)     LOS: 9 days   Time Spent in minutes   30 minutes  Jhoana Upham D.O. on 09/21/2015 at 1:10 PM  Between 7am to 7pm - Pager - [phone]  After 7pm go to [email or web address] - password [email or web address]  And look for the night coverage person covering for me after hours  [facility] Office  [phone]

## 2015-09-21 NOTE — Care Management Important Message (Signed)
Important Message  Patient Details  Name: Shannon [name] MRN: [id number] Date of Birth: 1923-10-19   Medicare Important Message Given:  Yes    Camillo Flaming 09/21/2015, 9:11 AMImportant Message  Patient Details  Name: Shannon [name] MRN: [id number] Date of Birth: 1923/04/17   Medicare Important Message Given:  Yes    Camillo Flaming 09/21/2015, 9:11 AM

## 2015-09-21 NOTE — Progress Notes (Signed)
Pt will discharge home with daughter and [facility] when medically stable.

## 2015-09-22 ENCOUNTER — Inpatient Hospital Stay (HOSPITAL_COMMUNITY): Payer: Medicare Other

## 2015-09-22 ENCOUNTER — Ambulatory Visit: Payer: Medicare Other | Admitting: Cardiology

## 2015-09-22 LAB — GLUCOSE, CAPILLARY
Glucose-Capillary: 79 mg/dL (ref 65–99)
Glucose-Capillary: 76 mg/dL (ref 65–99)
Glucose-Capillary: 76 mg/dL (ref 65–99)
Glucose-Capillary: 165 mg/dL — ABNORMAL HIGH (ref 65–99)

## 2015-09-22 LAB — BASIC METABOLIC PANEL
Anion gap: 9 (ref 5–15)
BUN: 6 mg/dL (ref 6–20)
CO2: 26 mmol/L (ref 22–32)
Calcium: 8.8 mg/dL — ABNORMAL LOW (ref 8.9–10.3)
Chloride: 103 mmol/L (ref 101–111)
Creatinine, Ser: 1.06 mg/dL — ABNORMAL HIGH (ref 0.44–1.00)
GFR calc Af Amer: 52 mL/min — ABNORMAL LOW (ref >60)
GFR calc non Af Amer: 45 mL/min — ABNORMAL LOW (ref >60)
Glucose, Bld: 81 mg/dL (ref 65–99)
Potassium: 3.2 mmol/L — ABNORMAL LOW (ref 3.5–5.1)
Sodium: 138 mmol/L (ref 135–145)

## 2015-09-22 LAB — CBC
HCT: 27.7 % — ABNORMAL LOW (ref 36.0–46.0)
Hemoglobin: 9.8 g/dL — ABNORMAL LOW (ref 12.0–15.0)
MCH: 33.2 pg (ref 26.0–34.0)
MCHC: 35.4 g/dL (ref 30.0–36.0)
MCV: 93.9 fL (ref 78.0–100.0)
Platelets: 312 10*3/uL (ref 150–400)
RBC: 2.95 MIL/uL — ABNORMAL LOW (ref 3.87–5.11)
RDW: 17.9 % — ABNORMAL HIGH (ref 11.5–15.5)
WBC: 10.6 10*3/uL — ABNORMAL HIGH (ref 4.0–10.5)

## 2015-09-22 LAB — APTT: aPTT: 80 seconds — ABNORMAL HIGH (ref 24–37)

## 2015-09-22 LAB — PROTIME-INR
INR: 1.17 (ref 0.00–1.49)
Prothrombin Time: 15.1 seconds (ref 11.6–15.2)

## 2015-09-22 LAB — HEPARIN LEVEL (UNFRACTIONATED)
Heparin Unfractionated: 0.19 IU/mL — ABNORMAL LOW (ref 0.30–0.70)
Heparin Unfractionated: 0.53 IU/mL (ref 0.30–0.70)

## 2015-09-22 MED ORDER — POTASSIUM CHLORIDE 10 MEQ/100ML IV SOLN
10.0000 meq | INTRAVENOUS | Status: AC
  Administered 2015-09-22 (×2): 10 meq via INTRAVENOUS
  Filled 2015-09-22 (×2): qty 100

## 2015-09-22 MED ORDER — DIATRIZOATE MEGLUMINE & SODIUM 66-10 % PO SOLN
30.0000 mL | Freq: Once | ORAL | Status: AC
  Administered 2015-09-22: 60 mL via ORAL
  Filled 2015-09-22: qty 30

## 2015-09-22 MED ORDER — HEPARIN (PORCINE) IN NACL 100-0.45 UNIT/ML-% IJ SOLN: 800.0000 [IU]/h | INTRAMUSCULAR | Status: DC

## 2015-09-22 NOTE — Progress Notes (Signed)
[facility] for Apixabab --> heparin Indication: Atrial Fibrillation  No Known Allergies  Patient Measurements: Height: 5\' 6"  (167.6 cm) Weight: 147 lb (66.679 kg) IBW/kg (Calculated) : 59.3 Heparin Dosing Weight: 66kg  Vital Signs: Temp: 98 F (36.7 C) (06/21 0426) Temp Source: Oral (06/21 0426) BP: 191/100 mmHg (06/21 0426) Pulse Rate: 68 (06/20 2026)  Labs:  Recent Labs  09/20/15 0446 09/20/15 1648 09/21/15 0451 09/22/15 0457  HGB 10.1*  --  9.6* 9.8*  HCT 28.3*  --  27.0* 27.7*  PLT 326  --  333 312  APTT  --   --   --  80*  LABPROT  --   --   --  15.1  INR  --   --   --  1.17  HEPARINUNFRC 0.33 0.44 0.33 0.19*  CREATININE  --   --  1.19* 1.06*    Estimated Creatinine Clearance: 33 mL/min (by C-G formula based on Cr of 1.06).  Assessment: 42 yoF on chronic apixaban for hx atrial fibrillation hospitalized for GOO, transitioned prior to admission apixaban to IV heparin at admission for EGD on 6/14.  She required dilation of moderate stenosis and suction of retained fluid and small food fragments. GI ok with resuming anticoagulation 6/15.  Patient initially tolerated diet, but again having abd distention and nausea requiring NGT placement.  Pharmacy consulted to resume heparin gtt again.    PTA apixaban 2.5mg  PO BID  09/22/2015  Heparin level: 0.19, decreased to subtherapeutic  CBC: Hgb low/stable, Pltc WNL  No bleeding documented  Planning for GJ tube placement today    Goal of Therapy:  Monitor platelets by anticoagulation protocol: Yes  Heparin level 0.3-0.6 APTT 66-102 sec   Plan:   Increase to heparin at 800 units/hr  Recheck heparin level in 8 hrs   Daily heparin level, CBC  Monitor for s/s bleeding  Follow up plans to hold/continue heparin for GJ tube placement and long-term anticoagulation plans   Gretta Arab PharmD, BCPS Pager [phone] 09/22/2015 7:11 AM

## 2015-09-22 NOTE — Progress Notes (Signed)
PT Cancellation Note  Patient Details Name: Shannon [name] MRN: [id number] DOB: [birth year]   Cancelled Treatment:    Reason Eval/Treat Not Completed: Patient declined, no reason specified   Weston Anna, MPT Pager: [phone]

## 2015-09-22 NOTE — Progress Notes (Signed)
PROGRESS NOTE    Chizuko Chinen  ZOX:[id number] DOB: May 27, 1923 DOA: 09/11/2015 PCP: Kirt Boys, DO   Brief Narrative:  HPI on 09/12/2015 by Dr. Marcial Pacas Opyd Veneda Newell is a medically complex 82 y.o. female with medical history significant for hypertension, hyperlipidemia, hypothyroidism, peripheral arterial disease with ischemic toe complicated by osteomyelitis, and chronic gastric distention with recurrent nausea and vomiting who presents to the ED for evaluation of abdominal pain with nausea and vomiting of 2 days' duration. Patient had a recent admission, just discharged on 09/05/2015 following management of abdominal pain with nausea and vomiting. She had an NG tube placed at that time and was started on liquid diet, essentially improving spontaneously and tolerating full diet. She was also noted to have an ischemic third toe on the right foot secondary to PAD and workup revealed underlying osteomyelitis. Surgery was advised for management of this, however patient declined. She was discharged with a one-month course of Augmentin. She had initially done well back at home before the insidious development of recurrent abdominal pain with nausea and vomiting. She reports the vomitus as nonbloody and nonbilious. There's been no diarrhea; in fact she has not had a bowel movement in 2-3 days. She denies chest pain or palpitations, but does endorse dyspnea with minimal exertion. She denies any significant cough.  Interim history Patient found to have pyloric stenosis. Had EGD with dilatation. Patient was also treated for healthcare associated pneumonia. Plan is to have postpyloric feeding tube placed today.  Assessment & Plan   HCAP  -likely aspiration, from gastric outlet obstruction  -Currently appears nontoxic currently afebrile -Was treated with vancomycin and cefepime and transitioned to Unasyn -cultures negative  Gastric outlet obstruction due to pyloric stenosis -Initially there was  improvement after EGD and dilatation but still not back to baseline. -On 09/18/2015 patient has re-insertion of NG tube due to abdominal distention with significant output. -As per GI patient may need postpyloric feeding tube placement if she is unable to tolerate oral diet. -Pending further recommendations from surgery vs IR  Right third toe gangrene  -On Augmentin as an outpatient and Unasyn in the hospital, refused amputation last admission, again refuses, confirmed with her daughter.  -Suppressive antibiotic course ongoing for 30 days.  -After antibiotic course she wants to continue with wound care and supportive care.  -If worsening then daughter requested to consider comfort care. -Last day antibiotics 10/07/2015.  Dyslipidemia -On statin.  Depression  -On Zoloft.  Hypothyroidism  -recently Synthroid increased to 88 g few days ago, repeat TSH in 4 weeks from now. -Change to IV Synthroid at present.  GERD -On PPI  Paroxysmal atrial fibrillation with RVR -CHADSVASC 5 -Continue amiodarone and transitioned to IV heparin again in the setting of patient remaining nothing by mouth.  History of asthma/COPD -Stable.  Hypokalemia  -Replacing, continue to monitor BMP  DVT Prophylaxis    Code Status: partial code, DNR, DNI  Family Communication: None at bedside  Disposition Plan: Admitted. Pending further recommendations from surgery vs IR   Consultants Gastroenterology General surgery  Procedures  EGD  Antibiotics   Anti-infectives    Start     Dose/Rate Route Frequency Ordered Stop   09/20/15 1200  Ampicillin-Sulbactam (UNASYN) 3 g in sodium chloride 0.9 % 100 mL IVPB     3 g 100 mL/hr over 60 Minutes Intravenous Every 6 hours 09/20/15 0829     09/16/15 1200  erythromycin 250 mg in sodium chloride 0.9 % 100 mL IVPB  Status:  Discontinued     250 mg 100 mL/hr over 60 Minutes Intravenous Every 6 hours 09/16/15 0846 09/17/15 1002   09/15/15 1800   ampicillin-sulbactam (UNASYN) 1.5 g in sodium chloride 0.9 % 50 mL IVPB  Status:  Discontinued     1.5 g 100 mL/hr over 30 Minutes Intravenous Every 6 hours 09/15/15 1103 09/20/15 0829   09/15/15 1715  erythromycin (E-MYCIN) tablet 250 mg  Status:  Discontinued     250 mg Oral 3 times daily before meals & bedtime 09/15/15 1656 09/16/15 0846   09/13/15 0600  vancomycin (VANCOCIN) IVPB 750 mg/150 ml premix  Status:  Discontinued     750 mg 150 mL/hr over 60 Minutes Intravenous Every 24 hours 09/12/15 0546 09/15/15 1036   09/12/15 2200  ceFEPIme (MAXIPIME) 1 g in dextrose 5 % 50 mL IVPB  Status:  Discontinued     1 g 100 mL/hr over 30 Minutes Intravenous Daily at bedtime 09/12/15 0410 09/15/15 1036   09/12/15 0430  ceFEPIme (MAXIPIME) 1 g in dextrose 5 % 50 mL IVPB     1 g 100 mL/hr over 30 Minutes Intravenous  Once 09/12/15 0427 09/12/15 0540   09/12/15 0100  vancomycin (VANCOCIN) IVPB 1000 mg/200 mL premix     1,000 mg 200 mL/hr over 60 Minutes Intravenous  Once 09/12/15 0046 09/12/15 0400   09/12/15 0100  piperacillin-tazobactam (ZOSYN) IVPB 3.375 g     3.375 g 100 mL/hr over 30 Minutes Intravenous  Once 09/12/15 0046 09/12/15 0222      Subjective:   [name] seen and examined today. Patient complains of throat irritations.  Denies further nausea today. Denies chest pain, shortness of breath, dizziness, headache.  Objective:   Filed Vitals:   09/21/15 1454 09/21/15 2026 09/22/15 0426 09/22/15 0700  BP: 168/90 150/64 191/100 144/82  Pulse: 72 68    Temp: 98.6 F (37 C) 98.9 F (37.2 C) 98 F (36.7 C)   TempSrc: Oral Oral Oral   Resp: 18 18 18    Height:      Weight:      SpO2: 96% 98% 95%     Intake/Output Summary (Last 24 hours) at 09/22/15 1123 Last data filed at 09/22/15 0700  Gross per 24 hour  Intake    676 ml  Output   1500 ml  Net   -824 ml   Filed Weights   09/11/15 2303 09/12/15 0241 09/15/15 1412  Weight: 68.04 kg (150 lb) 66.724 kg (147 lb 1.6 oz)  66.679 kg (147 lb)    Exam  General: Well developed, well nourished, no apparent distress  HEENT: NCAT, mucous membranes moist. NG tube   Cardiovascular: S1 S2 auscultated, no murmur, irregular   Respiratory: Clear to auscultation  Abdomen: Soft, mild epigastric TTP, mildly distended, + bowel sounds  Extremities: warm dry without cyanosis clubbing or edema  Neuro: AAOx3, nonfocal   Psych: Appropriate mood and affect   Data Reviewed: I have personally reviewed following labs and imaging studies  CBC:  Recent Labs Lab 09/17/15 0415 09/18/15 0437 09/19/15 0418 09/20/15 0446 09/21/15 0451 09/22/15 0457  WBC 10.8* 11.3* 10.9* 14.2* 10.5 10.6*  NEUTROABS 2.8  --  2.9  --   --   --   HGB 8.8* 9.6* 10.3* 10.1* 9.6* 9.8*  HCT 25.4* 27.2* 29.3* 28.3* 27.0* 27.7*  MCV 93.0 94.8 93.6 94.0 94.4 93.9  PLT 268 302 327 326 333 312   Basic Metabolic Panel:  Recent Labs Lab 09/16/15 0322 09/17/15  0865 09/18/15 0437 09/19/15 0418 09/21/15 0448 09/21/15 0451 09/22/15 0457  NA 137 136 138  --   --  137 138  K 3.5 3.7 3.6  --   --  2.9* 3.2*  CL 110 109 109  --   --  101 103  CO2 22 22 23   --   --  20* 26  GLUCOSE 83 81 61*  --   --  36* 81  BUN 7 <5* 5*  --   --  7 6  CREATININE 1.01* 1.04* 0.99  --   --  1.19* 1.06*  CALCIUM 8.0* 8.2* 8.5*  --   --  8.7* 8.8*  MG 1.7 1.5* 1.8 1.6* 1.6*  --   --   PHOS 1.8*  --   --   --   --   --   --    GFR: Estimated Creatinine Clearance: 33 mL/min (by C-G formula based on Cr of 1.06). Liver Function Tests:  Recent Labs Lab 09/17/15 0415  AST 23  ALT 16  ALKPHOS 40  BILITOT 0.6  PROT 5.8*  ALBUMIN 2.5*   No results for input(s): LIPASE, AMYLASE in the last 168 hours. No results for input(s): AMMONIA in the last 168 hours. Coagulation Profile:  Recent Labs Lab 09/22/15 0457  INR 1.17   Cardiac Enzymes: No results for input(s): CKTOTAL, CKMB, CKMBINDEX, TROPONINI in the last 168 hours. BNP (last 3 results) No  results for input(s): PROBNP in the last 8760 hours. HbA1C: No results for input(s): HGBA1C in the last 72 hours. CBG:  Recent Labs Lab 09/21/15 1202 09/21/15 1618 09/21/15 2023 09/22/15 0422 09/22/15 0751  GLUCAP 72 91 106* 79 76   Lipid Profile: No results for input(s): CHOL, HDL, LDLCALC, TRIG, CHOLHDL, LDLDIRECT in the last 72 hours. Thyroid Function Tests: No results for input(s): TSH, T4TOTAL, FREET4, T3FREE, THYROIDAB in the last 72 hours. Anemia Panel: No results for input(s): VITAMINB12, FOLATE, FERRITIN, TIBC, IRON, RETICCTPCT in the last 72 hours. Urine analysis:    Component Value Date/Time   COLORURINE YELLOW 09/12/2015 0036   APPEARANCEUR CLEAR 09/12/2015 0036   LABSPEC 1.025 09/12/2015 0036   PHURINE 6.5 09/12/2015 0036   GLUCOSEU NEGATIVE 09/12/2015 0036   HGBUR NEGATIVE 09/12/2015 0036   BILIRUBINUR NEGATIVE 09/12/2015 0036   KETONESUR NEGATIVE 09/12/2015 0036   PROTEINUR 100* 09/12/2015 0036   UROBILINOGEN 0.2 01/27/2015 2308   NITRITE NEGATIVE 09/12/2015 0036   LEUKOCYTESUR NEGATIVE 09/12/2015 0036   Sepsis Labs: @LABRCNTIP (procalcitonin:4,lacticidven:4)  ) Recent Results (from the past 240 hour(s))  MRSA PCR Screening     Status: None   Collection Time: 09/14/15  3:25 PM  Result Value Ref Range Status   MRSA by PCR NEGATIVE NEGATIVE Final    Comment:        The GeneXpert MRSA Assay (FDA approved for NASAL specimens only), is one component of a comprehensive MRSA colonization surveillance program. It is not intended to diagnose MRSA infection nor to guide or monitor treatment for MRSA infections.       Radiology Studies: No results found.   Scheduled Meds: . amiodarone  100 mg Oral Daily  . ampicillin-sulbactam (UNASYN) IV  3 g Intravenous Q6H  . carvedilol  3.125 mg Oral BID WC  . chlorhexidine  15 mL Mouth/Throat BID  . cycloSPORINE  1 drop Both Eyes BID  . fluticasone  2 spray Each Nare Daily  . gabapentin  300 mg Oral Daily    . gabapentin  600  mg Oral 2 times per day  . latanoprost  1 drop Both Eyes QHS  . levothyroxine  44 mcg Intravenous Daily  . metoCLOPramide (REGLAN) injection  5 mg Intravenous Q8H  . mometasone-formoterol  2 puff Inhalation BID  . nitroGLYCERIN  0.5 inch Topical Q12H  . pantoprazole  40 mg Oral Daily  . polyvinyl alcohol  1 drop Both Eyes QID  . potassium chloride  40 mEq Oral Daily  . sertraline  100 mg Oral Daily   Continuous Infusions: . heparin 800 Units/hr (09/22/15 0957)     LOS: 10 days   Time Spent in minutes   30 minutes  Easten Maceachern D.O. on 09/22/2015 at 11:23 AM  Between 7am to 7pm - Pager - [phone]  After 7pm go to [email or web address] - password [email or web address]  And look for the night coverage person covering for me after hours  [facility] Office  [phone]

## 2015-09-22 NOTE — Progress Notes (Signed)
Brief Pharmacy Note re: Heparin  O:  Heparin level 0.53 (goal 0.3-0.7)       No bleeding or infusion related issues reported by RN  A:  Heparin level therapeutic on current rate of 800 units/hr.  P:  Continue current rate       F/U daily labs         Netta Cedars, PharmD, BCPS Pager: [phone] 09/22/2015@6 :28 PM

## 2015-09-22 NOTE — Progress Notes (Signed)
Progress Note: General Surgery Service   Subjective: No nausea, NG in place with bilious drainage  Objective: Vital signs in last 24 hours: Temp:  [98 F (36.7 C)-98.9 F (37.2 C)] 98 F (36.7 C) (06/21 0426) Pulse Rate:  [68-72] 68 (06/20 2026) Resp:  [18] 18 (06/21 0426) BP: (144-191)/(64-100) 144/82 mmHg (06/21 0700) SpO2:  [95 %-98 %] 95 % (06/21 0426) Last BM Date: 09/17/15  Intake/Output from previous day: 06/20 0701 - 06/21 0700 In: 796 [P.O.:240; I.V.:156; IV Piggyback:400] Out: 1650 [Urine:550; Emesis/NG output:1100] Intake/Output this shift:    Lungs: CTAB  Cardiovascular: irreg irreg  Abd: soft, NT, ND  Extremities: no edema  Neuro: AOx4  Lab Results: CBC   Recent Labs  09/21/15 0451 09/22/15 0457  WBC 10.5 10.6*  HGB 9.6* 9.8*  HCT 27.0* 27.7*  PLT 333 312   BMET  Recent Labs  09/21/15 0451 09/22/15 0457  NA 137 138  K 2.9* 3.2*  CL 101 103  CO2 20* 26  GLUCOSE 36* 81  BUN 7 6  CREATININE 1.19* 1.06*  CALCIUM 8.7* 8.8*   PT/INR  Recent Labs  09/22/15 0457  LABPROT 15.1  INR 1.17   ABG No results for input(s): PHART, HCO3 in the last 72 hours.  Invalid input(s): PCO2, PO2  Studies/Results:  Anti-infectives: Anti-infectives    Start     Dose/Rate Route Frequency Ordered Stop   09/20/15 1200  Ampicillin-Sulbactam (UNASYN) 3 g in sodium chloride 0.9 % 100 mL IVPB     3 g 100 mL/hr over 60 Minutes Intravenous Every 6 hours 09/20/15 0829     09/16/15 1200  erythromycin 250 mg in sodium chloride 0.9 % 100 mL IVPB  Status:  Discontinued     250 mg 100 mL/hr over 60 Minutes Intravenous Every 6 hours 09/16/15 0846 09/17/15 1002   09/15/15 1800  ampicillin-sulbactam (UNASYN) 1.5 g in sodium chloride 0.9 % 50 mL IVPB  Status:  Discontinued     1.5 g 100 mL/hr over 30 Minutes Intravenous Every 6 hours 09/15/15 1103 09/20/15 0829   09/15/15 1715  erythromycin (E-MYCIN) tablet 250 mg  Status:  Discontinued     250 mg Oral 3 times  daily before meals & bedtime 09/15/15 1656 09/16/15 0846   09/13/15 0600  vancomycin (VANCOCIN) IVPB 750 mg/150 ml premix  Status:  Discontinued     750 mg 150 mL/hr over 60 Minutes Intravenous Every 24 hours 09/12/15 0546 09/15/15 1036   09/12/15 2200  ceFEPIme (MAXIPIME) 1 g in dextrose 5 % 50 mL IVPB  Status:  Discontinued     1 g 100 mL/hr over 30 Minutes Intravenous Daily at bedtime 09/12/15 0410 09/15/15 1036   09/12/15 0430  ceFEPIme (MAXIPIME) 1 g in dextrose 5 % 50 mL IVPB     1 g 100 mL/hr over 30 Minutes Intravenous  Once 09/12/15 0427 09/12/15 0540   09/12/15 0100  vancomycin (VANCOCIN) IVPB 1000 mg/200 mL premix     1,000 mg 200 mL/hr over 60 Minutes Intravenous  Once 09/12/15 0046 09/12/15 0400   09/12/15 0100  piperacillin-tazobactam (ZOSYN) IVPB 3.375 g     3.375 g 100 mL/hr over 30 Minutes Intravenous  Once 09/12/15 0046 09/12/15 0222      Medications: Scheduled Meds: . amiodarone  100 mg Oral Daily  . ampicillin-sulbactam (UNASYN) IV  3 g Intravenous Q6H  . carvedilol  3.125 mg Oral BID WC  . chlorhexidine  15 mL Mouth/Throat BID  . cycloSPORINE  1 drop Both Eyes BID  . fluticasone  2 spray Each Nare Daily  . gabapentin  300 mg Oral Daily  . gabapentin  600 mg Oral 2 times per day  . latanoprost  1 drop Both Eyes QHS  . levothyroxine  44 mcg Intravenous Daily  . metoCLOPramide (REGLAN) injection  5 mg Intravenous Q8H  . mometasone-formoterol  2 puff Inhalation BID  . nitroGLYCERIN  0.5 inch Topical Q12H  . pantoprazole  40 mg Oral Daily  . polyvinyl alcohol  1 drop Both Eyes QID  . potassium chloride  40 mEq Oral Daily  . sertraline  100 mg Oral Daily   Continuous Infusions: . heparin 800 Units/hr (09/22/15 0957)   PRN Meds:.acetaminophen, albuterol, bisacodyl, diclofenac sodium, hydrALAZINE, lidocaine, lip balm, metoprolol, ondansetron (ZOFRAN) IV, phenol  Assessment/Plan: Patient Active Problem List   Diagnosis Date Noted  . Pyloric stenosis   .  Pressure ulcer 09/15/2015  . Hyperkalemia 09/12/2015  . Osteomyelitis of ankle or foot ([place])   . PVD (peripheral vascular disease) ([place])   . Diabetic osteomyelitis ([place]) 09/02/2015  . Emesis   . Adjustment disorder with anxiety 08/29/2015  . Diabetic ulcer of toe of right foot associated with type 2 diabetes mellitus, with necrosis of muscle ([place])   . Diverticulitis of large intestine without perforation or abscess without bleeding   . Colitis 08/26/2015  . Atrial fibrillation ([place]) 08/26/2015  . N&V (nausea and vomiting)   . Acute on chronic diastolic heart failure ([place]) 08/02/2015  . Esophageal candidiasis ([place])   . Goals of care, counseling/discussion   . Encounter for hospice care discussion   . Dyspnea   . SOB (shortness of breath)   . Acute renal failure superimposed on stage 3 chronic kidney disease ([place])   . Encounter for long-term (current) use of other high-risk medications   . Respiratory distress 07/06/2015  . Atrial flutter with rapid ventricular response ([place]) 07/06/2015  . Dysphagia 07/05/2015  . Asthma with allergic rhinitis 06/04/2015  . Hypertensive heart disease 03/18/2015  . S/P exploratory laparotomy 01/20/15 01/28/2015  . Malnutrition of moderate degree 01/28/2015  . Syncope 01/27/2015  . HCAP (healthcare-associated pneumonia) 01/23/2015  . Fever, unspecified 01/22/2015  . Bradycardia 01/21/2015  . Hypotension 01/21/2015  . Palliative care encounter   . DNR (do not resuscitate) discussion   . Protein-calorie malnutrition, severe 01/12/2015  . Elevated troponin 01/12/2015  . HTN (hypertension), uncontrolled 01/12/2015  . AP (abdominal pain) 01/10/2015  . SBO (small bowel obstruction) ([place]) 01/10/2015  . SIADH (syndrome of inappropriate ADH production) ([place]) 01/10/2015  . Cerebrovascular disease 07/14/2014  . Diabetic polyneuropathy associated with type 2 diabetes mellitus ([place]) 07/14/2014  . Hyperlipidemia 07/14/2014  . Gastric distention   . Gastric  outlet obstruction 05/21/2014  . Unstable gait 04/29/2014  . Hyponatremia 03/23/2014  . Acute encephalopathy 03/22/2014  . Slurred speech   . Chest pain   . Gastroparesis   . Slow transit constipation 02/03/2014  . Carotid stenosis 02/03/2014  . Demand ischemia ([place]) 01/21/2014  . Gastric bezoar 01/20/2014  . Anemia of chronic disease 01/18/2014  . CKD (chronic kidney disease) stage 3, GFR 30-59 ml/min 01/18/2014  . Depression 09/29/2013  . Perforated ulcer ([place]) 09/29/2013  . Essential hypertension, benign 09/22/2013  . GERD (gastroesophageal reflux disease) 09/22/2013  . Dyslipidemia 09/22/2013  . DM (diabetes mellitus) type II uncontrolled, periph vascular disorder ([place]) 09/22/2013  . A-fib ([place]) 09/22/2013  . CVA (cerebral vascular accident) ([place]) 09/22/2013  . Diabetic neuropathy (  [place]) 09/22/2013  . Hypothyroidism 09/22/2013  . Chronic gastrojejunal ulcer with perforation ([place]) 09/07/2013  . Diabetes ([place]) 09/07/2013  . Accelerated hypertension 09/07/2013  . Systemic lupus erythematosus (SLE) inhibitor 09/07/2013  . Other specified abnormal immunological findings in serum 09/07/2013   s/p Procedure(s): ESOPHAGOGASTRODUODENOSCOPY (EGD) WITH PROPOFOL 09/15/2015 -Due to lack of a few specific tools I am unable to do a single staged GJ tube placement. Therefore, I have gotten the radiology team involved to assess whether they can achieve this. -I have updated the patient and family to this change -If radiology is unable to perform this procedure I will discuss with the family for either placing a G tube (endoscopically or with radiology) and converting to a [place] later versus placing the tube surgically   LOS: 10 days   Mickeal Skinner, MD Pg# [phone] [facility]

## 2015-09-22 NOTE — Progress Notes (Signed)
Initial Nutrition Assessment  DOCUMENTATION CODES:   Severe malnutrition in context of acute illness/injury  INTERVENTION:   When feeding is initiated, recommend monitor magnesium, potassium, and phosphorus daily for at least 3 days, MD to replete as needed, as pt is at risk for refeeding syndrome given severe malnutrition, poor PO intake and significant weight loss.  TF recommendations: Initiate Osmolite 1.5 @ 20 ml/hr and increase by 10 ml every 12 hours to goal rate of 45 ml/hr.  30 ml Prostat daily.   If patient not receiving IVF, recommend free water flushes of 200 ml TID. Tube feeding regimen provides 1720 kcal (101% of needs), 83 grams of protein, and 1423 ml of H2O.   RD to continue to monitor  NUTRITION DIAGNOSIS:   Inadequate oral intake related to inability to eat as evidenced by NPO status.  GOAL:   Patient will meet greater than or equal to 90% of their needs  MONITOR:   Labs, Weight trends, Skin, I & O's  REASON FOR ASSESSMENT:   NPO/Clear Liquid Diet, LOS    ASSESSMENT:   82 y.o. female with medical history significant for hypertension, hyperlipidemia, hypothyroidism, peripheral arterial disease with ischemic toe complicated by osteomyelitis, and chronic gastric distention with recurrent nausea and vomiting who presents to the ED for evaluation of abdominal pain with nausea and vomiting of 2 days' duration. Patient had a recent admission, just discharged on 09/05/2015 following management of abdominal pain with nausea and vomiting. She had an NG tube placed at that time and was started on liquid diet, essentially improving spontaneously and tolerating full diet.  Patient reports N/V PTA, she is unable to state when was the last time she tolerated solid food. Pt's diet was advanced 6/16 to soft diet but she was unable to tolerate. She now has a NGT for suction and is NPO. She has been primarily NPO since 6/9 (12 days). Denies nausea. Surgery was consulted for  GJ-tube placement. RD provided recommendations above once tube is placed. Per weight history, pt has lost 17 lb since 4/20 (10% wt loss x 2 months, significant for time frame). Nutrition-Focused physical exam completed. Findings are mild fat depletion, mild muscle depletion, and no edema.   Once feedings initiated, recommend monitor for refeeding syndrome given severe malnutrition.  Medications: IV Reglan Labs reviewed: Low K  Diet Order:  Diet NPO time specified  Skin:  Wound (see comment) (toe wound)  Last BM:  6/16  Height:   Ht Readings from Last 1 Encounters:  09/15/15 5\' 6"  (1.676 m)    Weight:   Wt Readings from Last 1 Encounters:  09/15/15 147 lb (66.679 kg)    Ideal Body Weight:  59.1 kg  BMI:  Body mass index is 23.74 kg/(m^2).  Estimated Nutritional Needs:   Kcal:  1500-1700  Protein:  75-85g  Fluid:  1.5-1.7L/day  EDUCATION NEEDS:   No education needs identified at this time  Clayton Bibles, MS, RD, LDN Pager: [phone] After Hours Pager: [phone]

## 2015-09-22 NOTE — Progress Notes (Signed)
I discussed with the patient and family about the findings of the UGI. The radiologist does not think it is able to place a GJ tube past the stenosis. We discussed options of how to proceed and have decided to undergo an open GJ tube placement to allow enteral feeding access.

## 2015-09-23 ENCOUNTER — Encounter (HOSPITAL_COMMUNITY): Payer: Self-pay | Admitting: Certified Registered Nurse Anesthetist

## 2015-09-23 ENCOUNTER — Inpatient Hospital Stay (HOSPITAL_COMMUNITY): Payer: Medicare Other

## 2015-09-23 ENCOUNTER — Inpatient Hospital Stay (HOSPITAL_COMMUNITY): Payer: Medicare Other | Admitting: Certified Registered Nurse Anesthetist

## 2015-09-23 ENCOUNTER — Encounter (HOSPITAL_COMMUNITY)
Admission: EM | Disposition: A | Discharge status: Home Health | Payer: Self-pay | Source: Home / Self Care | Attending: Internal Medicine

## 2015-09-23 HISTORY — PX: GASTROJEJUNOSTOMY: SHX1697

## 2015-09-23 LAB — GLUCOSE, CAPILLARY
Glucose-Capillary: 110 mg/dL — ABNORMAL HIGH (ref 65–99)
Glucose-Capillary: 111 mg/dL — ABNORMAL HIGH (ref 65–99)
Glucose-Capillary: 112 mg/dL — ABNORMAL HIGH (ref 65–99)
Glucose-Capillary: 91 mg/dL (ref 65–99)
Glucose-Capillary: 93 mg/dL (ref 65–99)
Glucose-Capillary: 98 mg/dL (ref 65–99)

## 2015-09-23 LAB — CBC
HCT: 29.6 % — ABNORMAL LOW (ref 36.0–46.0)
Hemoglobin: 10.5 g/dL — ABNORMAL LOW (ref 12.0–15.0)
MCH: 33.3 pg (ref 26.0–34.0)
MCHC: 35.5 g/dL (ref 30.0–36.0)
MCV: 94 fL (ref 78.0–100.0)
Platelets: 327 10*3/uL (ref 150–400)
RBC: 3.15 MIL/uL — ABNORMAL LOW (ref 3.87–5.11)
RDW: 17.9 % — ABNORMAL HIGH (ref 11.5–15.5)
WBC: 12.2 10*3/uL — ABNORMAL HIGH (ref 4.0–10.5)

## 2015-09-23 LAB — BASIC METABOLIC PANEL
Anion gap: 7 (ref 5–15)
BUN: 6 mg/dL (ref 6–20)
CO2: 26 mmol/L (ref 22–32)
Calcium: 9 mg/dL (ref 8.9–10.3)
Chloride: 105 mmol/L (ref 101–111)
Creatinine, Ser: 1.04 mg/dL — ABNORMAL HIGH (ref 0.44–1.00)
GFR calc Af Amer: 53 mL/min — ABNORMAL LOW (ref >60)
GFR calc non Af Amer: 46 mL/min — ABNORMAL LOW (ref >60)
Glucose, Bld: 107 mg/dL — ABNORMAL HIGH (ref 65–99)
Potassium: 3.7 mmol/L (ref 3.5–5.1)
Sodium: 138 mmol/L (ref 135–145)

## 2015-09-23 LAB — APTT: aPTT: 30 seconds (ref 24–37)

## 2015-09-23 LAB — HEPARIN LEVEL (UNFRACTIONATED): Heparin Unfractionated: 0.44 IU/mL (ref 0.30–0.70)

## 2015-09-23 SURGERY — GASTROJEJUNOSTOMY
Anesthesia: General

## 2015-09-23 MED ORDER — ROCURONIUM BROMIDE 100 MG/10ML IV SOLN
INTRAVENOUS | Status: AC
  Filled 2015-09-23: qty 1

## 2015-09-23 MED ORDER — EPHEDRINE SULFATE 50 MG/ML IJ SOLN
INTRAMUSCULAR | Status: AC
  Filled 2015-09-23: qty 1

## 2015-09-23 MED ORDER — LIDOCAINE HCL (CARDIAC) 20 MG/ML IV SOLN
INTRAVENOUS | Status: AC
Start: 2015-09-23 — End: 2015-09-23
  Filled 2015-09-23: qty 10

## 2015-09-23 MED ORDER — MEPERIDINE HCL 50 MG/ML IJ SOLN: 6.2500 mg | INTRAMUSCULAR | Status: DC | PRN

## 2015-09-23 MED ORDER — EPHEDRINE SULFATE 50 MG/ML IJ SOLN
INTRAMUSCULAR | Status: DC | PRN
  Administered 2015-09-23 (×4): 5 mg via INTRAVENOUS
  Administered 2015-09-23: 10 mg via INTRAVENOUS
  Administered 2015-09-23 (×4): 5 mg via INTRAVENOUS
  Administered 2015-09-23: 10 mg via INTRAVENOUS
  Administered 2015-09-23 (×3): 5 mg via INTRAVENOUS

## 2015-09-23 MED ORDER — BUPIVACAINE-EPINEPHRINE 0.25% -1:200000 IJ SOLN
INTRAMUSCULAR | Status: AC
  Filled 2015-09-23: qty 1

## 2015-09-23 MED ORDER — PROMETHAZINE HCL 25 MG/ML IJ SOLN: 6.2500 mg | INTRAMUSCULAR | Status: DC | PRN

## 2015-09-23 MED ORDER — FENTANYL CITRATE (PF) 100 MCG/2ML IJ SOLN
INTRAMUSCULAR | Status: AC
  Filled 2015-09-23: qty 2

## 2015-09-23 MED ORDER — HEPARIN (PORCINE) IN NACL 100-0.45 UNIT/ML-% IJ SOLN
800.0000 [IU]/h | INTRAMUSCULAR | Status: DC
  Administered 2015-09-24: 800 [IU]/h via INTRAVENOUS
  Filled 2015-09-23 (×2): qty 250

## 2015-09-23 MED ORDER — MORPHINE SULFATE (PF) 10 MG/ML IV SOLN
2.0000 mg | INTRAVENOUS | Status: DC | PRN
  Administered 2015-09-23 (×2): 2 mg via INTRAVENOUS
  Administered 2015-09-23 – 2015-09-24 (×2): 4 mg via INTRAVENOUS
  Administered 2015-09-26: 2 mg via INTRAVENOUS
  Filled 2015-09-23 (×5): qty 1

## 2015-09-23 MED ORDER — PROPOFOL 10 MG/ML IV BOLUS
INTRAVENOUS | Status: AC
  Filled 2015-09-23: qty 20

## 2015-09-23 MED ORDER — SODIUM CHLORIDE 0.9 % IJ SOLN
INTRAMUSCULAR | Status: AC
  Filled 2015-09-23: qty 10

## 2015-09-23 MED ORDER — 0.9 % SODIUM CHLORIDE (POUR BTL) OPTIME
TOPICAL | Status: DC | PRN
  Administered 2015-09-23: 1000 mL

## 2015-09-23 MED ORDER — SUGAMMADEX SODIUM 200 MG/2ML IV SOLN
INTRAVENOUS | Status: AC
  Filled 2015-09-23: qty 2

## 2015-09-23 MED ORDER — ROCURONIUM BROMIDE 100 MG/10ML IV SOLN
INTRAVENOUS | Status: DC | PRN
  Administered 2015-09-23: 20 mg via INTRAVENOUS

## 2015-09-23 MED ORDER — LIDOCAINE HCL (CARDIAC) 20 MG/ML IV SOLN
INTRAVENOUS | Status: DC | PRN
  Administered 2015-09-23: 50 mg via INTRAVENOUS

## 2015-09-23 MED ORDER — LACTATED RINGERS IV SOLN
INTRAVENOUS | Status: DC | PRN
  Administered 2015-09-23: 10:00:00 via INTRAVENOUS

## 2015-09-23 MED ORDER — LACTATED RINGERS IV SOLN: INTRAVENOUS | Status: DC

## 2015-09-23 MED ORDER — SUCCINYLCHOLINE CHLORIDE 20 MG/ML IJ SOLN
INTRAMUSCULAR | Status: DC | PRN
  Administered 2015-09-23: 100 mg via INTRAVENOUS

## 2015-09-23 MED ORDER — PROPOFOL 10 MG/ML IV BOLUS
INTRAVENOUS | Status: DC | PRN
  Administered 2015-09-23: 70 mg via INTRAVENOUS

## 2015-09-23 MED ORDER — SUGAMMADEX SODIUM 200 MG/2ML IV SOLN
INTRAVENOUS | Status: DC | PRN
  Administered 2015-09-23: 150 mg via INTRAVENOUS

## 2015-09-23 MED ORDER — FENTANYL CITRATE (PF) 100 MCG/2ML IJ SOLN
INTRAMUSCULAR | Status: DC | PRN
  Administered 2015-09-23: 25 ug via INTRAVENOUS
  Administered 2015-09-23: 50 ug via INTRAVENOUS
  Administered 2015-09-23: 25 ug via INTRAVENOUS

## 2015-09-23 MED ORDER — FENTANYL CITRATE (PF) 100 MCG/2ML IJ SOLN
25.0000 ug | INTRAMUSCULAR | Status: DC | PRN
  Administered 2015-09-23 (×2): 25 ug via INTRAVENOUS

## 2015-09-23 MED ORDER — ONDANSETRON HCL 4 MG/2ML IJ SOLN
INTRAMUSCULAR | Status: AC
  Filled 2015-09-23: qty 2

## 2015-09-23 SURGICAL SUPPLY — 35 items
CATH GASTROSTOMY 24FR (CATHETERS) ×2 IMPLANT
CHLORAPREP W/TINT 26ML (MISCELLANEOUS) ×2 IMPLANT
COVER SURGICAL LIGHT HANDLE (MISCELLANEOUS) ×2 IMPLANT
DECANTER SPIKE VIAL GLASS SM (MISCELLANEOUS) ×2 IMPLANT
DRAPE LAPAROSCOPIC ABDOMINAL (DRAPES) ×2 IMPLANT
DRSG TELFA 4X14 ISLAND ADH (GAUZE/BANDAGES/DRESSINGS) ×2 IMPLANT
ELECT PENCIL ROCKER SW 15FT (MISCELLANEOUS) ×2 IMPLANT
ELECT REM PT RETURN 9FT ADLT (ELECTROSURGICAL) ×2
ELECTRODE REM PT RTRN 9FT ADLT (ELECTROSURGICAL) ×1 IMPLANT
GLOVE BIOGEL PI IND STRL 7.0 (GLOVE) ×1 IMPLANT
GLOVE BIOGEL PI INDICATOR 7.0 (GLOVE) ×1
GLOVE SURG SS PI 7.0 STRL IVOR (GLOVE) ×2 IMPLANT
GOWN STRL REUS W/TWL LRG LVL3 (GOWN DISPOSABLE) ×2 IMPLANT
GOWN STRL REUS W/TWL XL LVL3 (GOWN DISPOSABLE) ×2 IMPLANT
KIT BASIN OR (CUSTOM PROCEDURE TRAY) ×2 IMPLANT
LIGASURE IMPACT 36 18CM CVD LR (INSTRUMENTS) ×2 IMPLANT
NEEDLE HYPO 22GX1.5 SAFETY (NEEDLE) ×2 IMPLANT
PACK BASIC VI WITH GOWN DISP (CUSTOM PROCEDURE TRAY) ×2 IMPLANT
SPONGE LAP 18X18 X RAY DECT (DISPOSABLE) ×4 IMPLANT
SPONGE LAP 4X18 X RAY DECT (DISPOSABLE) ×2 IMPLANT
SUT ETHILON 3 0 PS 1 (SUTURE) ×4 IMPLANT
SUT MNCRL AB 4-0 PS2 18 (SUTURE) ×2 IMPLANT
SUT PDS AB 0 CT1 36 (SUTURE) ×4 IMPLANT
SUT PROLENE 0 CT 1 CR/8 (SUTURE) IMPLANT
SUT SILK 3 0 (SUTURE) ×1
SUT SILK 3 0 SH 30 (SUTURE) ×2 IMPLANT
SUT SILK 3 0 SH CR/8 (SUTURE) ×2 IMPLANT
SUT SILK 3-0 18XBRD TIE 12 (SUTURE) ×1 IMPLANT
SUT VIC AB 3-0 SH 27 (SUTURE) ×1
SUT VIC AB 3-0 SH 27X BRD (SUTURE) ×1 IMPLANT
SYR CONTROL 10ML LL (SYRINGE) ×2 IMPLANT
SYRINGE 20CC LL (MISCELLANEOUS) ×2 IMPLANT
TOWEL OR 17X26 10 PK STRL BLUE (TOWEL DISPOSABLE) ×2 IMPLANT
TOWEL OR NON WOVEN STRL DISP B (DISPOSABLE) ×2 IMPLANT
YANKAUER SUCT BULB TIP 10FT TU (MISCELLANEOUS) IMPLANT

## 2015-09-23 NOTE — Progress Notes (Addendum)
[place] for Apixabab --> heparin Indication: Atrial Fibrillation  No Known Allergies  Patient Measurements: Height: 5\' 6"  (167.6 cm) Weight: 147 lb (66.679 kg) IBW/kg (Calculated) : 59.3 Heparin Dosing Weight: 66kg  Vital Signs: Temp: 98.5 F (36.9 C) (06/22 0457) Temp Source: Oral (06/22 0457) BP: 145/79 mmHg (06/22 0606) Pulse Rate: 74 (06/22 0606)  Labs:  Recent Labs  09/21/15 0451 09/22/15 0457 09/22/15 1752 09/23/15 0509  HGB 9.6* 9.8*  --  10.5*  HCT 27.0* 27.7*  --  29.6*  PLT 333 312  --  327  APTT  --  80*  --   --   LABPROT  --  15.1  --   --   INR  --  1.17  --   --   HEPARINUNFRC 0.33 0.19* 0.53 0.44  CREATININE 1.19* 1.06*  --  1.04*    Estimated Creatinine Clearance: 33.7 mL/min (by C-G formula based on Cr of 1.04).  Assessment: 70 yoF on chronic apixaban for hx atrial fibrillation hospitalized for GOO, transitioned prior to admission apixaban to IV heparin at admission for EGD on 6/14.  She required dilation of moderate stenosis and suction of retained fluid and small food fragments. GI ok with resuming anticoagulation 6/15.  Patient initially tolerated diet, but again having abd distention and nausea requiring NGT placement.  Pharmacy consulted to resume heparin gtt again.    PTA apixaban 2.5mg  PO BID  09/23/2015  Heparin level: 0.44, therapeutic on Heparin at 800 units/hr  CBC: Hgb low/stable, Pltc WNL  No bleeding documented  Planning for open GJ tube placement today at 10:15, Heparin turned off at 0600   Goal of Therapy:  Monitor platelets by anticoagulation protocol: Yes  Heparin level 0.3-0.7 APTT 66-102 sec   Plan:   Continue to hold heparin  Daily heparin level, CBC  Monitor for s/s bleeding  Follow up plans to restart heparin after GJ tube placement and long-term anticoagulation plans   Gretta Arab PharmD, BCPS Pager [phone] 09/23/2015 7:05 AM

## 2015-09-23 NOTE — Progress Notes (Signed)
Pharmacy Antibiotic Note  Shannon [name] is a 82 y.o. female admitted on 09/11/2015 with pneumonia, osteomyelitis of right 3rd toe.  Pharmacy was consulted for Vancomycin, cefepime dosing.  Antibiotics changed to ampicillin/sulbactam 6/14. Day #12 antibiotics.  SCr stable, WBC increased, Afebrile.  Plan:  Continue Unasyn 3 gm IV q6h   Follow up renal function, culture results  Suggest convert to oral augment 500mg  BID as prescribed prior to admission.  Plan was to continue x 30 days (until 7/4)  Height: 5\' 6"  (167.6 cm) Weight: 147 lb (66.679 kg) IBW/kg (Calculated) : 59.3  Temp (24hrs), Avg:98.6 F (37 C), Min:98.5 F (36.9 C), Max:98.7 F (37.1 C)   Recent Labs Lab 09/17/15 0415 09/18/15 0437 09/19/15 0418 09/20/15 0446 09/21/15 0451 09/22/15 0457 09/23/15 0509  WBC 10.8* 11.3* 10.9* 14.2* 10.5 10.6* 12.2*  CREATININE 1.04* 0.99  --   --  1.19* 1.06* 1.04*    Estimated Creatinine Clearance: 33.7 mL/min (by C-G formula based on Cr of 1.04).    No Known Allergies  Antimicrobials this admission: Vancomycin 6/11 >> 6/14 Cefepime 6/11 >> 6/14 Amp/sulb 6/14 >> Erythromycin PO 6/14 >> 6/16  Dose adjustments this admission: (previous admission, 6/1 VT = 14 on vanc 750 q24h with SCr 1.4-1.6 range)  Microbiology results: 6/11 BCx: NGF 6/11 UCx: 8K insignificant growth 6/13 MRSA PCR: neg (5/27 Cdiff neg)   Thank you for allowing pharmacy to be a part of this patient's care.  Gretta Arab PharmD, BCPS Pager [phone] 09/23/2015 7:28 AM

## 2015-09-23 NOTE — Op Note (Signed)
Preoperative diagnosis: gastric outlet obstruction  Postoperative diagnosis: same   Procedure: open placement of gastrojejunostomy tube  2. flouroscopic manipulation of feeding tube Surgeon: [name], M.D.  Asst: none  Anesthesia: General  Indications for procedure: Shannon [name] is a 82 y.o. year old female with symptoms of chronic vomiting, malnutrition. She has a distant history of ulcer disease in the past underwent loop Gastrojejunostomy and since had multiple complications of bowel obstructions, perforation of anastomosis and presents again with gastric outlet obstruction. Due to previous issues, she elected to proceed with a lesser surgery to be able to get nutrition and offload her chronic vomiting issue with placement of GJ tube.  Description of procedure: The patient was brought into the operative suite. Anesthesia was administered with General endotracheal anesthesia. WHO checklist was applied. The patient was then placed in supine. The area was prepped and draped in the usual sterile fashion.  Next upper midline vertical incision was made through the native area of skin. Cautery was used to dissect down to its obtains tissues. Fascia was dissected through with cautery. Upon entering the peritoneal cavity there multiple thin adhesions in the area. These removed with sharp dissection and cautery. Colon was fairly superior in the abdomen but on further dissection was able to be pushed away yielding the gastrocolic ligament. In this dissection was one serosal tear made to the transverse colon this was closed with multiple 3-0 silk interrupted Lembert stitches. Multiple attachments between the liver and stomach were removed. Once stomach was yielded no mobilized was pulled medially and the gastrocolic ligament movement was ligated using a LigaSure device. Upon entering the lesser sac were able to palpate the pylorus.   At this time I decided we will be able to perform the [place] procedure  therefore a stab incision was made in the left upper quadrant blunt dissection was used to dissect down through the muscle and peritoneum in the [place] tube was brought through this hole. Next to layered 3-0 silk pursestring was placed over the distal body of the stomach along the greater curve. Cautery was then used to create a gastrotomy and the jejunal portion of the GJ tube was inserted through this hole.  Upon multiple passings it was difficult to determine whether or not the jejunal tip was getting through the pylorus. Therefore at this time and brought in the fluoroscopy device and confirmed that the jejunal tip was still within the body of the stomach. Using fluoroscopy we were able to get the jejunal tip past the pylorus and into the third portion of the duodenum. At this time the remainder of the jejunal tube and gastrostomy tube were inserted into the stomach the balloon was inflated with 10 mL of fluid. The pursestrings were tied and then sutured to the abdominal wall directly around the tube area. There was one additional interrupted 3-0 silk suture to pexy the anterior stomach up to the abdominal wall at the area of the tube. As we irrigated out the abdomen. I ensure that there is hemostasis in all areas. The fascia was then closed with a 0 PDS in running fashion. The skin was closed with staples. Honeycombed dressing was put in place. The gastric portion of the G-tube was placed drainage the jejunal portion was placed to a slow saline ventricle. Patient awoke from anesthesia and brought to PACU in stable condition. All counts are correct.  Findings: dilated stomach, jejunal limb of GJ tube in the third portion of the duodenum.  Specimen: none  Implant: 24Fr GJ tube   Blood loss: 180ml  Local anesthesia:   Complications: none  Postop -return to floor -G tube to gravity -J tube with 5ml/h saline -plan to advance to feeds tomorrow -plan to remove NG in am -plan to restart heparin  tomorrow after lab check  [name], M.D. General, Bariatric, & Minimally Invasive Surgery [facility]

## 2015-09-23 NOTE — Anesthesia Procedure Notes (Signed)
Procedure Name: Intubation Date/Time: 09/23/2015 10:41 AM Performed by: Deliah Boston Pre-anesthesia Checklist: Patient identified, Emergency Drugs available, Suction available and Patient being monitored Patient Re-evaluated:Patient Re-evaluated prior to inductionOxygen Delivery Method: Circle system utilized Preoxygenation: Pre-oxygenation with 100% oxygen Intubation Type: IV induction, Rapid sequence and Cricoid Pressure applied Ventilation: Mask ventilation without difficulty Laryngoscope Size: Mac and 3 Grade View: Grade I Tube type: Oral Tube size: 7.0 mm Number of attempts: 1 Airway Equipment and Method: Oral airway Placement Confirmation: ETT inserted through vocal cords under direct vision,  positive ETCO2 and breath sounds checked- equal and bilateral Secured at: 22 cm Tube secured with: Tape Dental Injury: Teeth and Oropharynx as per pre-operative assessment  Comments: NG placed to suction before and remains on suction during intubation

## 2015-09-23 NOTE — Transfer of Care (Signed)
Immediate Anesthesia Transfer of Care Note  Patient: Shannon [name]  Procedure(s) Performed: Procedure(s): OPEN GASTROJEJUNOSTOMY TUBE PLACEMENT (N/A)  Patient Location: PACU  Anesthesia Type:General  Level of Consciousness: Patient easily awoken, sedated, comfortable, cooperative, following commands, responds to stimulation.   Airway & Oxygen Therapy: Patient spontaneously breathing, ventilating well, oxygen via simple oxygen mask.  Post-op Assessment: Report given to PACU RN, vital signs reviewed and stable, moving all extremities.   Post vital signs: Reviewed and stable.  Complications: No apparent anesthesia complications

## 2015-09-23 NOTE — Anesthesia Preprocedure Evaluation (Addendum)
Anesthesia Evaluation  Patient identified by MRN, date of birth, ID band Patient awake    Reviewed: Allergy & Precautions, NPO status , Patient's Chart, lab work & pertinent test results, reviewed documented beta blocker date and time   Airway Mallampati: II  TM Distance: >3 FB Neck ROM: Full    Dental  (+) Dental Advisory Given, Caps, Edentulous Lower, Missing,    Pulmonary asthma , COPD,    breath sounds clear to auscultation       Cardiovascular hypertension, Pt. on medications and Pt. on home beta blockers + Peripheral Vascular Disease   Rhythm:Regular Rate:Normal     Neuro/Psych PSYCHIATRIC DISORDERS Depression  Neuromuscular disease CVA    GI/Hepatic Neg liver ROS, PUD, GERD  Medicated,  Endo/Other  diabetesHypothyroidism   Renal/GU Renal disease  negative genitourinary   Musculoskeletal  (+) Arthritis ,   Abdominal   Peds negative pediatric ROS (+)  Hematology negative hematology ROS (+)   Anesthesia Other Findings   Reproductive/Obstetrics negative OB ROS                          Lab Results  Component Value Date   WBC 12.2* 09/23/2015   HGB 10.5* 09/23/2015   HCT 29.6* 09/23/2015   MCV 94.0 09/23/2015   PLT 327 09/23/2015   Lab Results  Component Value Date   CREATININE 1.04* 09/23/2015   BUN 6 09/23/2015   NA 138 09/23/2015   K 3.7 09/23/2015   CL 105 09/23/2015   CO2 26 09/23/2015   Lab Results  Component Value Date   INR 1.17 09/22/2015   INR 1.37 09/13/2015   INR 1.24 08/02/2015   08/2015 EKG: normal sinus rhythm.  07/2015 Echo - Left ventricle: The cavity size was normal. Wall thickness was increased in a pattern of mild LVH. Systolic function was normal. The estimated ejection fraction was in the range of 60% to 65%. Doppler parameters are consistent with abnormal left ventricular relaxation (grade 1 diastolic dysfunction). - Aortic valve: There was mild  regurgitation. - Mitral valve: There was mild regurgitation. - Left atrium: The atrium was moderately to severely dilated.    Anesthesia Physical Anesthesia Plan  ASA: III  Anesthesia Plan: General   Post-op Pain Management:    Induction: Intravenous, Rapid sequence and Cricoid pressure planned  Airway Management Planned: Oral ETT  Additional Equipment:   Intra-op Plan:   Post-operative Plan: Extubation in OR  Informed Consent: I have reviewed the patients History and Physical, chart, labs and discussed the procedure including the risks, benefits and alternatives for the proposed anesthesia with the patient or authorized representative who has indicated his/her understanding and acceptance.   Dental advisory given  Plan Discussed with: CRNA  Anesthesia Plan Comments:         Anesthesia Quick Evaluation

## 2015-09-23 NOTE — Anesthesia Postprocedure Evaluation (Signed)
Anesthesia Post Note  Patient: Shannon [name]  Procedure(s) Performed: Procedure(s) (LRB): OPEN GASTROJEJUNOSTOMY TUBE PLACEMENT (N/A)  Patient location during evaluation: PACU Anesthesia Type: General Level of consciousness: awake and alert Pain management: pain level controlled Vital Signs Assessment: post-procedure vital signs reviewed and stable Respiratory status: spontaneous breathing, nonlabored ventilation, respiratory function stable and patient connected to nasal cannula oxygen Cardiovascular status: blood pressure returned to baseline and stable Postop Assessment: no signs of nausea or vomiting Anesthetic complications: no    Last Vitals:  Filed Vitals:   09/23/15 1330 09/23/15 1339  BP: 145/83 131/83  Pulse: 98 98  Temp: 37 C 37 C  Resp: 15 15    Last Pain:  Filed Vitals:   09/23/15 1340  PainSc: 2                  Effie Berkshire

## 2015-09-23 NOTE — Progress Notes (Signed)
PROGRESS NOTE    Shannon [name]  [id number]:[id number] DOB: [birth year] DOA: 09/11/2015 PCP: Kirt Boys, DO   Brief Narrative:  HPI on 09/12/2015 by Dr. Marcial Pacas Opyd Shannon [name] is a medically complex 82 y.o. female with medical history significant for hypertension, hyperlipidemia, hypothyroidism, peripheral arterial disease with ischemic toe complicated by osteomyelitis, and chronic gastric distention with recurrent nausea and vomiting who presents to the ED for evaluation of abdominal pain with nausea and vomiting of 2 days' duration. Patient had a recent admission, just discharged on 09/05/2015 following management of abdominal pain with nausea and vomiting. She had an NG tube placed at that time and was started on liquid diet, essentially improving spontaneously and tolerating full diet. She was also noted to have an ischemic third toe on the right foot secondary to PAD and workup revealed underlying osteomyelitis. Surgery was advised for management of this, however patient declined. She was discharged with a one-month course of Augmentin. She had initially done well back at home before the insidious development of recurrent abdominal pain with nausea and vomiting. She reports the vomitus as nonbloody and nonbilious. There's been no diarrhea; in fact she has not had a bowel movement in 2-3 days. She denies chest pain or palpitations, but does endorse dyspnea with minimal exertion. She denies any significant cough.  Interim history Patient found to have pyloric stenosis. Had EGD with dilatation. Patient was also treated for healthcare associated pneumonia. Plan is to have postpyloric feeding tube placed today.  Assessment & Plan   HCAP  -likely aspiration, from gastric outlet obstruction  -Currently appears nontoxic currently afebrile -Was treated with vancomycin and cefepime and transitioned to Unasyn -cultures negative  Gastric outlet obstruction due to pyloric stenosis -Initially there was  improvement after EGD and dilatation but still not back to baseline. -On 09/18/2015 patient has re-insertion of NG tube due to abdominal distention with significant output. -As per GI patient may need postpyloric feeding tube placement if she is unable to tolerate oral diet. -Pending placement of GJ tube today by general surgery, Dr. Sheliah Hatch  Right third toe gangrene  -On Augmentin as an outpatient and Unasyn in the hospital, refused amputation last admission, again refuses, confirmed with her daughter.  -Suppressive antibiotic course ongoing for 30 days.  -After antibiotic course she wants to continue with wound care and supportive care.  -If worsening then daughter requested to consider comfort care. -Last day antibiotics 10/07/2015.  Dyslipidemia -On statin.  Depression  -On Zoloft.  Hypothyroidism  -recently Synthroid increased to 88 g few days ago, repeat TSH in 4 weeks from now. -Change to IV Synthroid at present.  GERD -On PPI  Paroxysmal atrial fibrillation with RVR -CHADSVASC 5 -Continue amiodarone and transitioned to IV heparin again in the setting of patient remaining nothing by mouth.  Normocytic anemia -Hemoglobin appears to be at baseline, currently 10.5 -Continue to monitor CBC  History of asthma/COPD -Stable.  Hypokalemia  -Replacing, continue to monitor BMP  DVT Prophylaxis    Code Status: partial code, DNR, DNI  Family Communication: None at bedside  Disposition Plan: Admitted. Pending GJ tube placement today.  Consultants Gastroenterology General surgery  Procedures  EGD  Antibiotics   Anti-infectives    Start     Dose/Rate Route Frequency Ordered Stop   09/20/15 1200  [MAR Hold]  Ampicillin-Sulbactam (UNASYN) 3 g in sodium chloride 0.9 % 100 mL IVPB     (MAR Hold since 09/23/15 0938)   3 g 100 mL/hr over 60 Minutes Intravenous  Every 6 hours 09/20/15 0829     09/16/15 1200  erythromycin 250 mg in sodium chloride 0.9 % 100 mL IVPB   Status:  Discontinued     250 mg 100 mL/hr over 60 Minutes Intravenous Every 6 hours 09/16/15 0846 09/17/15 1002   09/15/15 1800  ampicillin-sulbactam (UNASYN) 1.5 g in sodium chloride 0.9 % 50 mL IVPB  Status:  Discontinued     1.5 g 100 mL/hr over 30 Minutes Intravenous Every 6 hours 09/15/15 1103 09/20/15 0829   09/15/15 1715  erythromycin (E-MYCIN) tablet 250 mg  Status:  Discontinued     250 mg Oral 3 times daily before meals & bedtime 09/15/15 1656 09/16/15 0846   09/13/15 0600  vancomycin (VANCOCIN) IVPB 750 mg/150 ml premix  Status:  Discontinued     750 mg 150 mL/hr over 60 Minutes Intravenous Every 24 hours 09/12/15 0546 09/15/15 1036   09/12/15 2200  ceFEPIme (MAXIPIME) 1 g in dextrose 5 % 50 mL IVPB  Status:  Discontinued     1 g 100 mL/hr over 30 Minutes Intravenous Daily at bedtime 09/12/15 0410 09/15/15 1036   09/12/15 0430  ceFEPIme (MAXIPIME) 1 g in dextrose 5 % 50 mL IVPB     1 g 100 mL/hr over 30 Minutes Intravenous  Once 09/12/15 0427 09/12/15 0540   09/12/15 0100  vancomycin (VANCOCIN) IVPB 1000 mg/200 mL premix     1,000 mg 200 mL/hr over 60 Minutes Intravenous  Once 09/12/15 0046 09/12/15 0400   09/12/15 0100  piperacillin-tazobactam (ZOSYN) IVPB 3.375 g     3.375 g 100 mL/hr over 30 Minutes Intravenous  Once 09/12/15 0046 09/12/15 0222      Subjective:   Shannon [name] seen and examined today. Patient states she is feeling "ok" today.  DDenies further nausea today. Denies chest pain, shortness of breath, dizziness, headache. Would like to to have the NG tube removed.   Objective:   Filed Vitals:   09/22/15 2052 09/23/15 0457 09/23/15 0606 09/23/15 0851  BP: 147/54 181/66 145/79   Pulse: 98 84 74   Temp: 98.6 F (37 C) 98.5 F (36.9 C)    TempSrc: Oral Oral    Resp: 18 16    Height:      Weight:      SpO2: 98% 100%  93%    Intake/Output Summary (Last 24 hours) at 09/23/15 0951 Last data filed at 09/23/15 0603  Gross per 24 hour  Intake 1139.98 ml    Output      0 ml  Net 1139.98 ml   Filed Weights   09/11/15 2303 09/12/15 0241 09/15/15 1412  Weight: 68.04 kg (150 lb) 66.724 kg (147 lb 1.6 oz) 66.679 kg (147 lb)    Exam  General: Well developed, well nourished, elderly, NAD  HEENT: NCAT, mucous membranes moist. NG tube in place  Cardiovascular: S1 S2 auscultated, no murmur, irregular   Respiratory: Clear to auscultation  Abdomen: Soft, mild epigastric TTP, mildly distended, + bowel sounds  Extremities: warm dry without cyanosis clubbing or edema  Neuro: AAOx3, nonfocal   Psych: Appropriate, pleasant   Data Reviewed: I have personally reviewed following labs and imaging studies  CBC:  Recent Labs Lab 09/17/15 0415  09/19/15 0418 09/20/15 0446 09/21/15 0451 09/22/15 0457 09/23/15 0509  WBC 10.8*  < > 10.9* 14.2* 10.5 10.6* 12.2*  NEUTROABS 2.8  --  2.9  --   --   --   --   HGB 8.8*  < >  10.3* 10.1* 9.6* 9.8* 10.5*  HCT 25.4*  < > 29.3* 28.3* 27.0* 27.7* 29.6*  MCV 93.0  < > 93.6 94.0 94.4 93.9 94.0  PLT 268  < > 327 326 333 312 327  < > = values in this interval not displayed. Basic Metabolic Panel:  Recent Labs Lab 09/17/15 0415 09/18/15 0437 09/19/15 0418 09/21/15 0448 09/21/15 0451 09/22/15 0457 09/23/15 0509  NA 136 138  --   --  137 138 138  K 3.7 3.6  --   --  2.9* 3.2* 3.7  CL 109 109  --   --  101 103 105  CO2 22 23  --   --  20* 26 26  GLUCOSE 81 61*  --   --  36* 81 107*  BUN <5* 5*  --   --  7 6 6   CREATININE 1.04* 0.99  --   --  1.19* 1.06* 1.04*  CALCIUM 8.2* 8.5*  --   --  8.7* 8.8* 9.0  MG 1.5* 1.8 1.6* 1.6*  --   --   --    GFR: Estimated Creatinine Clearance: 33.7 mL/min (by C-G formula based on Cr of 1.04). Liver Function Tests:  Recent Labs Lab 09/17/15 0415  AST 23  ALT 16  ALKPHOS 40  BILITOT 0.6  PROT 5.8*  ALBUMIN 2.5*   No results for input(s): LIPASE, AMYLASE in the last 168 hours. No results for input(s): AMMONIA in the last 168 hours. Coagulation  Profile:  Recent Labs Lab 09/22/15 0457  INR 1.17   Cardiac Enzymes: No results for input(s): CKTOTAL, CKMB, CKMBINDEX, TROPONINI in the last 168 hours. BNP (last 3 results) No results for input(s): PROBNP in the last 8760 hours. HbA1C: No results for input(s): HGBA1C in the last 72 hours. CBG:  Recent Labs Lab 09/22/15 1616 09/22/15 2039 09/23/15 0023 09/23/15 0512 09/23/15 0727  GLUCAP 76 165* 111* 112* 98   Lipid Profile: No results for input(s): CHOL, HDL, LDLCALC, TRIG, CHOLHDL, LDLDIRECT in the last 72 hours. Thyroid Function Tests: No results for input(s): TSH, T4TOTAL, FREET4, T3FREE, THYROIDAB in the last 72 hours. Anemia Panel: No results for input(s): VITAMINB12, FOLATE, FERRITIN, TIBC, IRON, RETICCTPCT in the last 72 hours. Urine analysis:    Component Value Date/Time   COLORURINE YELLOW 09/12/2015 0036   APPEARANCEUR CLEAR 09/12/2015 0036   LABSPEC 1.025 09/12/2015 0036   PHURINE 6.5 09/12/2015 0036   GLUCOSEU NEGATIVE 09/12/2015 0036   HGBUR NEGATIVE 09/12/2015 0036   BILIRUBINUR NEGATIVE 09/12/2015 0036   KETONESUR NEGATIVE 09/12/2015 0036   PROTEINUR 100* 09/12/2015 0036   UROBILINOGEN 0.2 01/27/2015 2308   NITRITE NEGATIVE 09/12/2015 0036   LEUKOCYTESUR NEGATIVE 09/12/2015 0036   Sepsis Labs: @LABRCNTIP (procalcitonin:4,lacticidven:4)  ) Recent Results (from the past 240 hour(s))  MRSA PCR Screening     Status: None   Collection Time: 09/14/15  3:25 PM  Result Value Ref Range Status   MRSA by PCR NEGATIVE NEGATIVE Final    Comment:        The GeneXpert MRSA Assay (FDA approved for NASAL specimens only), is one component of a comprehensive MRSA colonization surveillance program. It is not intended to diagnose MRSA infection nor to guide or monitor treatment for MRSA infections.       Radiology Studies: Dg Ugi W/water Sol Cm  09/22/2015  CLINICAL DATA:  Gastric outlet obstruction, pyloric stenosis EXAM: WATER SOLUBLE UPPER GI SERIES  TECHNIQUE: Single-column upper GI series was performed using water soluble contrast. CONTRAST:  60 mL cc of water-soluble contrast COMPARISON:  CT 09/11/2015 FLUOROSCOPY TIME:  Radiation Exposure Index (as provided by the fluoroscopic device): 33.7 mGy If the device does not provide the exposure index: Fluoroscopy Time (in minutes and seconds): Number of Acquired Images: FINDINGS: 60 cc of water-soluble contrast and 50 cc of water was hand injected into the stomach via the NG tube. The contrast fills the stomach which is nondistended. Persistent narrowing at the gastric pylorus. Small trickle of contrast does pass through the duodenum. IMPRESSION: No evidence of high-grade obstruction. Stenosis through the pyloric region of stomach. Electronically Signed   By: Genevive Bi M.D.   On: 09/22/2015 13:17     Scheduled Meds: . [MAR Hold] amiodarone  100 mg Oral Daily  . [MAR Hold] ampicillin-sulbactam (UNASYN) IV  3 g Intravenous Q6H  . [MAR Hold] carvedilol  3.125 mg Oral BID WC  . [MAR Hold] chlorhexidine  15 mL Mouth/Throat BID  . [MAR Hold] cycloSPORINE  1 drop Both Eyes BID  . [MAR Hold] fluticasone  2 spray Each Nare Daily  . [MAR Hold] gabapentin  300 mg Oral Daily  . [MAR Hold] gabapentin  600 mg Oral 2 times per day  . [MAR Hold] latanoprost  1 drop Both Eyes QHS  . [MAR Hold] levothyroxine  44 mcg Intravenous Daily  . [MAR Hold] metoCLOPramide (REGLAN) injection  5 mg Intravenous Q8H  . [MAR Hold] mometasone-formoterol  2 puff Inhalation BID  . [MAR Hold] nitroGLYCERIN  0.5 inch Topical Q12H  . [MAR Hold] pantoprazole  40 mg Oral Daily  . [MAR Hold] polyvinyl alcohol  1 drop Both Eyes QID  . [MAR Hold] sertraline  100 mg Oral Daily   Continuous Infusions: . heparin Stopped (09/23/15 0603)     LOS: 11 days   Time Spent in minutes   30 minutes  Tjay Velazquez D.O. on 09/23/2015 at 9:51 AM  Between 7am to 7pm - Pager - [phone]  After 7pm go to [email or web address] - password  [email or web address]  And look for the night coverage person covering for me after hours  [facility] Office  [phone]

## 2015-09-24 ENCOUNTER — Encounter (HOSPITAL_COMMUNITY): Payer: Self-pay | Admitting: General Surgery

## 2015-09-24 LAB — GLUCOSE, CAPILLARY
Glucose-Capillary: 102 mg/dL — ABNORMAL HIGH (ref 65–99)
Glucose-Capillary: 118 mg/dL — ABNORMAL HIGH (ref 65–99)
Glucose-Capillary: 149 mg/dL — ABNORMAL HIGH (ref 65–99)
Glucose-Capillary: 152 mg/dL — ABNORMAL HIGH (ref 65–99)
Glucose-Capillary: 85 mg/dL (ref 65–99)
Glucose-Capillary: 90 mg/dL (ref 65–99)
Glucose-Capillary: 91 mg/dL (ref 65–99)

## 2015-09-24 LAB — HEPARIN LEVEL (UNFRACTIONATED)
Heparin Unfractionated: <0.1 IU/mL — ABNORMAL LOW (ref 0.30–0.70)
Heparin Unfractionated: 0.19 IU/mL — ABNORMAL LOW (ref 0.30–0.70)

## 2015-09-24 LAB — BASIC METABOLIC PANEL
Anion gap: 8 (ref 5–15)
BUN: 9 mg/dL (ref 6–20)
CO2: 24 mmol/L (ref 22–32)
Calcium: 8.5 mg/dL — ABNORMAL LOW (ref 8.9–10.3)
Chloride: 108 mmol/L (ref 101–111)
Creatinine, Ser: 2.07 mg/dL — ABNORMAL HIGH (ref 0.44–1.00)
GFR calc Af Amer: 23 mL/min — ABNORMAL LOW (ref >60)
GFR calc non Af Amer: 20 mL/min — ABNORMAL LOW (ref >60)
Glucose, Bld: 95 mg/dL (ref 65–99)
Potassium: 3.5 mmol/L (ref 3.5–5.1)
Sodium: 140 mmol/L (ref 135–145)

## 2015-09-24 LAB — CBC
HCT: 30 % — ABNORMAL LOW (ref 36.0–46.0)
Hemoglobin: 10.4 g/dL — ABNORMAL LOW (ref 12.0–15.0)
MCH: 33.4 pg (ref 26.0–34.0)
MCHC: 34.7 g/dL (ref 30.0–36.0)
MCV: 96.5 fL (ref 78.0–100.0)
Platelets: 267 10*3/uL (ref 150–400)
RBC: 3.11 MIL/uL — ABNORMAL LOW (ref 3.87–5.11)
RDW: 18.5 % — ABNORMAL HIGH (ref 11.5–15.5)
WBC: 17 10*3/uL — ABNORMAL HIGH (ref 4.0–10.5)

## 2015-09-24 MED ORDER — OSMOLITE 1.5 CAL PO LIQD
1000.0000 mL | ORAL | Status: DC
  Administered 2015-09-24 – 2015-09-26 (×3): 1000 mL
  Filled 2015-09-24 (×4): qty 1000

## 2015-09-24 MED ORDER — FREE WATER
60.0000 mL | Freq: Four times a day (QID) | Status: DC
  Administered 2015-09-24 – 2015-09-30 (×24): 60 mL

## 2015-09-24 MED ORDER — SODIUM CHLORIDE 0.9 % IV SOLN
1.5000 g | Freq: Two times a day (BID) | INTRAVENOUS | Status: DC
  Administered 2015-09-24 – 2015-09-25 (×2): 1.5 g via INTRAVENOUS
  Filled 2015-09-24 (×3): qty 1.5

## 2015-09-24 MED ORDER — HEPARIN (PORCINE) IN NACL 100-0.45 UNIT/ML-% IJ SOLN
1050.0000 [IU]/h | INTRAMUSCULAR | Status: DC
  Administered 2015-09-24: 1000 [IU]/h via INTRAVENOUS
  Administered 2015-09-25 – 2015-09-26 (×2): 1050 [IU]/h via INTRAVENOUS
  Filled 2015-09-24 (×2): qty 250

## 2015-09-24 MED ORDER — SODIUM CHLORIDE 0.9 % IV SOLN
INTRAVENOUS | Status: DC
  Administered 2015-09-24 – 2015-09-27 (×7): via INTRAVENOUS

## 2015-09-24 MED ORDER — PRO-STAT SUGAR FREE PO LIQD
30.0000 mL | Freq: Every day | ORAL | Status: DC
  Administered 2015-09-24 – 2015-09-27 (×4): 30 mL
  Filled 2015-09-24 (×4): qty 30

## 2015-09-24 MED ORDER — SODIUM CHLORIDE 0.9 % IV BOLUS (SEPSIS)
500.0000 mL | Freq: Once | INTRAVENOUS | Status: AC
  Administered 2015-09-24: 500 mL via INTRAVENOUS

## 2015-09-24 MED ORDER — VITAL HIGH PROTEIN PO LIQD
10.0000 mL/h | ORAL | Status: DC
  Administered 2015-09-24: 10 mL/h
  Filled 2015-09-24: qty 1000

## 2015-09-24 NOTE — Care Management Important Message (Signed)
Important Message  Patient Details  Name: Daliyah Widger MRN: [id number] Date of Birth: July 30, 1923   Medicare Important Message Given:  Yes    Camillo Flaming 09/24/2015, 9:04 AMImportant Message  Patient Details  Name: Heela Frydman MRN: [id number] Date of Birth: [birth year]   Medicare Important Message Given:  Yes    Camillo Flaming 09/24/2015, 9:04 AM

## 2015-09-24 NOTE — Progress Notes (Signed)
PROGRESS NOTE    Shannon [name]  PPI:[id number] DOB: 12/11/1923 DOA: 09/11/2015 PCP: Kirt Boys, DO   Brief Narrative:  HPI on 09/12/2015 by Dr. Marcial Pacas Opyd Shannon [name] is a medically complex 82 y.o. female with medical history significant for hypertension, hyperlipidemia, hypothyroidism, peripheral arterial disease with ischemic toe complicated by osteomyelitis, and chronic gastric distention with recurrent nausea and vomiting who presents to the ED for evaluation of abdominal pain with nausea and vomiting of 2 days' duration. Patient had a recent admission, just discharged on 09/05/2015 following management of abdominal pain with nausea and vomiting. She had an NG tube placed at that time and was started on liquid diet, essentially improving spontaneously and tolerating full diet. She was also noted to have an ischemic third toe on the right foot secondary to PAD and workup revealed underlying osteomyelitis. Surgery was advised for management of this, however patient declined. She was discharged with a one-month course of Augmentin. She had initially done well back at home before the insidious development of recurrent abdominal pain with nausea and vomiting. She reports the vomitus as nonbloody and nonbilious. There's been no diarrhea; in fact she has not had a bowel movement in 2-3 days. She denies chest pain or palpitations, but does endorse dyspnea with minimal exertion. She denies any significant cough.  Interim history Patient found to have pyloric stenosis. Had EGD with dilatation. Patient was also treated for healthcare associated pneumonia. S/p GJ tube placement.   Assessment & Plan   HCAP  -likely aspiration, from gastric outlet obstruction  -Currently appears nontoxic currently afebrile -Was treated with vancomycin and cefepime and transitioned to Unasyn -cultures negative  Gastric outlet obstruction due to pyloric stenosis -Initially there was improvement after EGD and dilatation  but still not back to baseline. -On 09/18/2015 patient has re-insertion of NG tube due to abdominal distention with significant output. -As per GI patient may need postpyloric feeding tube placement if she is unable to tolerate oral diet. -s/p open GJ tube placement by general surgery, Dr. Sheliah Hatch -Nutrition consulted for tube feeds  Acute kidney injury -Likely secondary to contrast -Patient's creatinine 2.07 -Placed on gentle IVF -Continue to monitor I/O and BMP  Right third toe gangrene  -On Augmentin as an outpatient and Unasyn in the hospital, refused amputation last admission, again refuses, confirmed with her daughter.  -Suppressive antibiotic course ongoing for 30 days.  -After antibiotic course she wants to continue with wound care and supportive care.  -If worsening then daughter requested to consider comfort care. -Last day antibiotics 10/07/2015.  Dyslipidemia -On statin.  Depression  -On Zoloft.  Hypothyroidism  -recently Synthroid increased to 88 g few days ago, repeat TSH in 4 weeks from now. -Change to IV Synthroid at present.  GERD -On PPI  Paroxysmal atrial fibrillation with RVR -CHADSVASC 5 -Continue amiodarone and transitioned to IV heparin again in the setting of patient remaining nothing by mouth.  Normocytic anemia -Hemoglobin appears to be at baseline, currently 10.5 -Continue to monitor CBC  History of asthma/COPD -Stable.  Hypokalemia  -Replacing, continue to monitor BMP  DVT Prophylaxis    Code Status: partial code, DNR, DNI  Family Communication: None at bedside  Disposition Plan: Admitted. Pending improvement in creatinine and further recommendations from surgery  Consultants Gastroenterology General surgery  Procedures  EGD Open placement of gastrojejunostomy tube  Antibiotics   Anti-infectives    Start     Dose/Rate Route Frequency Ordered Stop   09/24/15 1800  ampicillin-sulbactam (UNASYN) 1.5 g  in sodium chloride 0.9  % 50 mL IVPB     1.5 g 100 mL/hr over 30 Minutes Intravenous Every 12 hours 09/24/15 0742     09/20/15 1200  Ampicillin-Sulbactam (UNASYN) 3 g in sodium chloride 0.9 % 100 mL IVPB  Status:  Discontinued     3 g 100 mL/hr over 60 Minutes Intravenous Every 6 hours 09/20/15 0829 09/24/15 0742   09/16/15 1200  erythromycin 250 mg in sodium chloride 0.9 % 100 mL IVPB  Status:  Discontinued     250 mg 100 mL/hr over 60 Minutes Intravenous Every 6 hours 09/16/15 0846 09/17/15 1002   09/15/15 1800  ampicillin-sulbactam (UNASYN) 1.5 g in sodium chloride 0.9 % 50 mL IVPB  Status:  Discontinued     1.5 g 100 mL/hr over 30 Minutes Intravenous Every 6 hours 09/15/15 1103 09/20/15 0829   09/15/15 1715  erythromycin (E-MYCIN) tablet 250 mg  Status:  Discontinued     250 mg Oral 3 times daily before meals & bedtime 09/15/15 1656 09/16/15 0846   09/13/15 0600  vancomycin (VANCOCIN) IVPB 750 mg/150 ml premix  Status:  Discontinued     750 mg 150 mL/hr over 60 Minutes Intravenous Every 24 hours 09/12/15 0546 09/15/15 1036   09/12/15 2200  ceFEPIme (MAXIPIME) 1 g in dextrose 5 % 50 mL IVPB  Status:  Discontinued     1 g 100 mL/hr over 30 Minutes Intravenous Daily at bedtime 09/12/15 0410 09/15/15 1036   09/12/15 0430  ceFEPIme (MAXIPIME) 1 g in dextrose 5 % 50 mL IVPB     1 g 100 mL/hr over 30 Minutes Intravenous  Once 09/12/15 0427 09/12/15 0540   09/12/15 0100  vancomycin (VANCOCIN) IVPB 1000 mg/200 mL premix     1,000 mg 200 mL/hr over 60 Minutes Intravenous  Once 09/12/15 0046 09/12/15 0400   09/12/15 0100  piperacillin-tazobactam (ZOSYN) IVPB 3.375 g     3.375 g 100 mL/hr over 30 Minutes Intravenous  Once 09/12/15 0046 09/12/15 0222      Subjective:   Shannon [name] seen and examined today. Patient states she is feeling very sleepy and tired today.  She denies further nausea today. She is happy that the NG tube was removed. Denies chest pain, shortness of breath, dizziness, headache.  Objective:    Filed Vitals:   09/23/15 2100 09/24/15 0200 09/24/15 0532 09/24/15 0804  BP: 139/79 129/78 118/53 125/50  Pulse: 89 87 81 73  Temp: 98.4 F (36.9 C) 98.7 F (37.1 C) 97.8 F (36.6 C)   TempSrc: Oral Oral Oral   Resp: 20 18 20    Height:      Weight:      SpO2: 99% 99% 99%     Intake/Output Summary (Last 24 hours) at 09/24/15 1223 Last data filed at 09/24/15 1200  Gross per 24 hour  Intake   1140 ml  Output    725 ml  Net    415 ml   Filed Weights   09/11/15 2303 09/12/15 0241 09/15/15 1412  Weight: 68.04 kg (150 lb) 66.724 kg (147 lb 1.6 oz) 66.679 kg (147 lb)    Exam  General: Well developed, well nourished, elderly, NAD  HEENT: NCAT, mucous membranes moist.   Cardiovascular: S1 S2 auscultated, no murmur, irregular   Respiratory: Clear to auscultation  Abdomen: Soft, mild epigastric TTP, mildly distended, + bowel sounds  Extremities: warm dry without cyanosis clubbing or edema  Neuro: AAOx3, nonfocal   Psych: Appropriate mood and affect,  pleasant   Data Reviewed: I have personally reviewed following labs and imaging studies  CBC:  Recent Labs Lab 09/19/15 0418 09/20/15 0446 09/21/15 0451 09/22/15 0457 09/23/15 0509 09/24/15 0448  WBC 10.9* 14.2* 10.5 10.6* 12.2* 17.0*  NEUTROABS 2.9  --   --   --   --   --   HGB 10.3* 10.1* 9.6* 9.8* 10.5* 10.4*  HCT 29.3* 28.3* 27.0* 27.7* 29.6* 30.0*  MCV 93.6 94.0 94.4 93.9 94.0 96.5  PLT 327 326 333 312 327 267   Basic Metabolic Panel:  Recent Labs Lab 09/18/15 0437 09/19/15 0418 09/21/15 0448 09/21/15 0451 09/22/15 0457 09/23/15 0509 09/24/15 0448  NA 138  --   --  137 138 138 140  K 3.6  --   --  2.9* 3.2* 3.7 3.5  CL 109  --   --  101 103 105 108  CO2 23  --   --  20* 26 26 24   GLUCOSE 61*  --   --  36* 81 107* 95  BUN 5*  --   --  7 6 6 9   CREATININE 0.99  --   --  1.19* 1.06* 1.04* 2.07*  CALCIUM 8.5*  --   --  8.7* 8.8* 9.0 8.5*  MG 1.8 1.6* 1.6*  --   --   --   --    GFR: Estimated  Creatinine Clearance: 16.9 mL/min (by C-G formula based on Cr of 2.07). Liver Function Tests: No results for input(s): AST, ALT, ALKPHOS, BILITOT, PROT, ALBUMIN in the last 168 hours. No results for input(s): LIPASE, AMYLASE in the last 168 hours. No results for input(s): AMMONIA in the last 168 hours. Coagulation Profile:  Recent Labs Lab 09/22/15 0457  INR 1.17   Cardiac Enzymes: No results for input(s): CKTOTAL, CKMB, CKMBINDEX, TROPONINI in the last 168 hours. BNP (last 3 results) No results for input(s): PROBNP in the last 8760 hours. HbA1C: No results for input(s): HGBA1C in the last 72 hours. CBG:  Recent Labs Lab 09/23/15 2046 09/23/15 2358 09/24/15 0527 09/24/15 0726 09/24/15 1131  GLUCAP 93 110* 91 90 85   Lipid Profile: No results for input(s): CHOL, HDL, LDLCALC, TRIG, CHOLHDL, LDLDIRECT in the last 72 hours. Thyroid Function Tests: No results for input(s): TSH, T4TOTAL, FREET4, T3FREE, THYROIDAB in the last 72 hours. Anemia Panel: No results for input(s): VITAMINB12, FOLATE, FERRITIN, TIBC, IRON, RETICCTPCT in the last 72 hours. Urine analysis:    Component Value Date/Time   COLORURINE YELLOW 09/12/2015 0036   APPEARANCEUR CLEAR 09/12/2015 0036   LABSPEC 1.025 09/12/2015 0036   PHURINE 6.5 09/12/2015 0036   GLUCOSEU NEGATIVE 09/12/2015 0036   HGBUR NEGATIVE 09/12/2015 0036   BILIRUBINUR NEGATIVE 09/12/2015 0036   KETONESUR NEGATIVE 09/12/2015 0036   PROTEINUR 100* 09/12/2015 0036   UROBILINOGEN 0.2 01/27/2015 2308   NITRITE NEGATIVE 09/12/2015 0036   LEUKOCYTESUR NEGATIVE 09/12/2015 0036   Sepsis Labs: @LABRCNTIP (procalcitonin:4,lacticidven:4)  ) Recent Results (from the past 240 hour(s))  MRSA PCR Screening     Status: None   Collection Time: 09/14/15  3:25 PM  Result Value Ref Range Status   MRSA by PCR NEGATIVE NEGATIVE Final    Comment:        The GeneXpert MRSA Assay (FDA approved for NASAL specimens only), is one component of  a comprehensive MRSA colonization surveillance program. It is not intended to diagnose MRSA infection nor to guide or monitor treatment for MRSA infections.       Radiology  Studies: Dg Abd 1 View  09/23/2015  CLINICAL DATA:  Gastrostomy tube placement. EXAM: ABDOMEN - 1 VIEW; DG C-ARM 1-60 MIN-NO REPORT COMPARISON:  09/22/2015 upper GI. FINDINGS: Fluoroscopy time 0.5 minutes. Single nondiagnostic spot fluoroscopic intraoperative radiograph demonstrates gastrostomy tube tip in the third portion of duodenum. IMPRESSION: Intraoperative fluoroscopic guidance for gastrostomy tube placement. Electronically Signed   By: Delbert Phenix M.D.   On: 09/23/2015 13:36   Dg [name] W/water Sol Cm  09/22/2015  CLINICAL DATA:  Gastric outlet obstruction, pyloric stenosis EXAM: WATER SOLUBLE UPPER GI SERIES TECHNIQUE: Single-column upper GI series was performed using water soluble contrast. CONTRAST:  60 mL cc of water-soluble contrast COMPARISON:  CT 09/11/2015 FLUOROSCOPY TIME:  Radiation Exposure Index (as provided by the fluoroscopic device): 33.7 mGy If the device does not provide the exposure index: Fluoroscopy Time (in minutes and seconds): Number of Acquired Images: FINDINGS: 60 cc of water-soluble contrast and 50 cc of water was hand injected into the stomach via the NG tube. The contrast fills the stomach which is nondistended. Persistent narrowing at the gastric pylorus. Small trickle of contrast does pass through the duodenum. IMPRESSION: No evidence of high-grade obstruction. Stenosis through the pyloric region of stomach. Electronically Signed   By: Genevive Bi M.D.   On: 09/22/2015 13:17   Dg C-arm 1-60 Min-no Report  09/23/2015  CLINICAL DATA: surgery C-ARM 1-60 MINUTES Fluoroscopy was utilized by the requesting physician.  No radiographic interpretation.     Scheduled Meds: . amiodarone  100 mg Oral Daily  . ampicillin-sulbactam (UNASYN) IV  1.5 g Intravenous Q12H  . carvedilol  3.125 mg  Oral BID WC  . chlorhexidine  15 mL Mouth/Throat BID  . cycloSPORINE  1 drop Both Eyes BID  . feeding supplement (PRO-STAT SUGAR FREE 64)  30 mL Per Tube Daily  . fluticasone  2 spray Each Nare Daily  . free water  60 mL Per Tube Q6H  . gabapentin  300 mg Oral Daily  . gabapentin  600 mg Oral 2 times per day  . latanoprost  1 drop Both Eyes QHS  . levothyroxine  44 mcg Intravenous Daily  . metoCLOPramide (REGLAN) injection  5 mg Intravenous Q8H  . mometasone-formoterol  2 puff Inhalation BID  . nitroGLYCERIN  0.5 inch Topical Q12H  . pantoprazole  40 mg Oral Daily  . polyvinyl alcohol  1 drop Both Eyes QID  . sertraline  100 mg Oral Daily   Continuous Infusions: . sodium chloride 75 mL/hr at 09/24/15 0802  . feeding supplement (OSMOLITE 1.5 CAL) 1,000 mL (09/24/15 1158)  . heparin 800 Units/hr (09/24/15 1158)     LOS: 12 days   Time Spent in minutes   30 minutes  Goebel Hellums D.O. on 09/24/2015 at 12:23 PM  Between 7am to 7pm - Pager - [phone]  After 7pm go to [email or web address] - password [email or web address]  And look for the night coverage person covering for me after hours  [facility] Office  [phone]

## 2015-09-24 NOTE — Progress Notes (Signed)
Pharmacy Antibiotic Note  Shannon [name] is a 82 y.o. female admitted on 09/11/2015 with pneumonia, osteomyelitis of right 3rd toe.  Pharmacy was consulted for Vancomycin, cefepime dosing.  Antibiotics changed to ampicillin/sulbactam 6/14. Day #13 antibiotics.  Remains afebrile but WBCs increasing - no steroids, could be postop related?  Plan:  Reduce Unasyn to 1.5 g IV q12 hr for worsening CrCl  F/u transition back to PO Augmentin, pending renal function (on 500 mg BID prior to admission)  Height: 5\' 6"  (167.6 cm) Weight: 147 lb (66.679 kg) IBW/kg (Calculated) : 59.3  Temp (24hrs), Avg:98.5 F (36.9 C), Min:97.8 F (36.6 C), Max:98.7 F (37.1 C)   Recent Labs Lab 09/18/15 0437  09/20/15 0446 09/21/15 0451 09/22/15 0457 09/23/15 0509 09/24/15 0448  WBC 11.3*  < > 14.2* 10.5 10.6* 12.2* 17.0*  CREATININE 0.99  --   --  1.19* 1.06* 1.04* 2.07*  < > = values in this interval not displayed.  Estimated Creatinine Clearance: 16.9 mL/min (by C-G formula based on Cr of 2.07).    No Known Allergies  Antimicrobials this admission: Vancomycin 6/11 >> 6/14 Cefepime 6/11 >> 6/14 Amp/sulb 6/14 >> Erythromycin PO (gastric motility) 6/14 >> 6/16  Dose adjustments this admission: (previous admission, 6/1 VT = 14 on vanc 750 q24h with SCr 1.4-1.6 range) 6/19 Increase Unasyn from 1.5 to 3g q6h 6/23: back to 1.5g q12 hr  Microbiology results: 6/11 BCx: NGF 6/11 UCx: 8K insignificant growth 6/13 MRSA PCR: neg (5/27 Cdiff neg)   Thank you for allowing pharmacy to be a part of this patient's care.  Reuel Boom, PharmD, BCPS Pager: [phone] 09/24/2015, 9:08 AM

## 2015-09-24 NOTE — Progress Notes (Signed)
[place] for Apixaban --> heparin Indication: Atrial Fibrillation  No Known Allergies  Patient Measurements: Height: 5\' 6"  (167.6 cm) Weight: 147 lb (66.679 kg) IBW/kg (Calculated) : 59.3 Heparin Dosing Weight: 66kg  Vital Signs: Temp: 98.6 F (37 C) (06/23 1957) Temp Source: Oral (06/23 1957) BP: 133/52 mmHg (06/23 1957) Pulse Rate: 85 (06/23 1957)  Labs:  Recent Labs  09/22/15 0457  09/23/15 0509 09/23/15 1020 09/24/15 0448 09/24/15 1950  HGB 9.8*  --  10.5*  --  10.4*  --   HCT 27.7*  --  29.6*  --  30.0*  --   PLT 312  --  327  --  267  --   APTT 80*  --   --  30  --   --   LABPROT 15.1  --   --   --   --   --   INR 1.17  --   --   --   --   --   HEPARINUNFRC 0.19*  < > 0.44  --  <0.10* 0.19*  CREATININE 1.06*  --  1.04*  --  2.07*  --   < > = values in this interval not displayed.  Estimated Creatinine Clearance: 16.9 mL/min (by C-G formula based on Cr of 2.07).  Assessment: 25 yoF on chronic apixaban for hx atrial fibrillation hospitalized for GOO, transitioned prior to admission apixaban to IV heparin at admission for EGD on 6/14.  She required dilation of moderate stenosis and suction of retained fluid and small food fragments. GI ok with resuming anticoagulation 6/15.  Patient initially tolerated diet, but again having abd distention and nausea requiring NGT placement.  Pharmacy consulted to resume heparin gtt again perioperatively for GJ tube placement  Significant events 6/22 - Underwent successful open GJ tube placement; Heparin turned off at 0600  PTA apixaban 2.5mg  PO BID  09/24/2015  HL now subtherapeutic on rate 800 units/hr  CBC: Hgb low/stable, Pltc WNL  No bleeding documented per RN, seen by surgery already, OK to resume heparin this afternoon  Goal of Therapy:  Monitor platelets by anticoagulation protocol: Yes  Heparin level 0.3-0.7   Plan:  1) Increase IV heparin rate from 800 units/hr to 1000  units/hr 2) Will recheck heparin level with AM labs    Adrian Saran, PharmD, BCPS Pager [phone] 09/24/2015 8:22 PM

## 2015-09-24 NOTE — Progress Notes (Signed)
Shannon [name] for Apixaban --> heparin Indication: Atrial Fibrillation  No Known Allergies  Patient Measurements: Height: 5\' 6"  (167.6 cm) Weight: 147 lb (66.679 kg) IBW/kg (Calculated) : 59.3 Heparin Dosing Weight: 66kg  Vital Signs: Temp: 97.8 F (36.6 C) (06/23 0532) Temp Source: Oral (06/23 0532) BP: 125/50 mmHg (06/23 0804) Pulse Rate: 73 (06/23 0804)  Labs:  Recent Labs  09/22/15 0457 09/22/15 1752 09/23/15 0509 09/23/15 1020 09/24/15 0448  HGB 9.8*  --  10.5*  --  10.4*  HCT 27.7*  --  29.6*  --  30.0*  PLT 312  --  327  --  267  APTT 80*  --   --  30  --   LABPROT 15.1  --   --   --   --   INR 1.17  --   --   --   --   HEPARINUNFRC 0.19* 0.53 0.44  --  <0.10*  CREATININE 1.06*  --  1.04*  --  2.07*    Estimated Creatinine Clearance: 16.9 mL/min (by C-G formula based on Cr of 2.07).  Assessment: 84 yoF on chronic apixaban for hx atrial fibrillation hospitalized for GOO, transitioned prior to admission apixaban to IV heparin at admission for EGD on 6/14.  She required dilation of moderate stenosis and suction of retained fluid and small food fragments. GI ok with resuming anticoagulation 6/15.  Patient initially tolerated diet, but again having abd distention and nausea requiring NGT placement.  Pharmacy consulted to resume heparin gtt again perioperatively for GJ tube placement  Significant events 6/22 - Underwent successful open GJ tube placement; Heparin turned off at 0600  PTA apixaban 2.5mg  PO BID  09/24/2015  AM heparin level undetectable while off; previously therapeutic on Heparin at 800 units/hr  CBC: Hgb low/stable, Pltc WNL  No bleeding documented per RN, seen by surgery already, OK to resume heparin this afternoon  Goal of Therapy:  Monitor platelets by anticoagulation protocol: Yes  Heparin level 0.3-0.7   Plan:   Hold heparin for now, restart at 800 units/hr at 12p today  Check 8 hr heparin level at  8p tonight  Daily heparin level, CBC  Monitor for s/s bleeding  F/u long-term anticoagulation plans   Reuel Boom, PharmD, BCPS Pager: [phone] 09/24/2015, 9:14 AM

## 2015-09-24 NOTE — Progress Notes (Signed)
Physical Therapy Treatment Patient Details Name: Shannon [name] MRN: [id number] DOB: 1923/12/22 Today's Date: 09/24/2015    History of Present Illness 82 yo female admitted with HCAP and Gastic outlet obstruction 2* pyloric stenosis. Hx of HTN, Osteomyelitis of 3rd toe on R foot,  Afib, MI, ventral hernia, CVA, DM, COPD, arthritis, PVD, neuropathy. S/P GJ tube placement 6/22.     PT Comments      Follow Up Recommendations  Home health PT;Supervision/Assistance - 24 hour (as long as daughter can provide current level of care)     Equipment Recommendations  None recommended by PT    Recommendations for Other Services       Precautions / Restrictions Precautions Precautions: Fall Precaution Comments: GJ tube drain, feeding tube Restrictions Weight Bearing Restrictions: No    Mobility  Bed Mobility Overal bed mobility: Needs Assistance Bed Mobility: Supine to Sit     Supine to sit: Mod assist     General bed mobility comments: assist for trunk and LEs. Increased time. Utilized bedpad for scooting, positioning.   Transfers Overall transfer level: Needs assistance Equipment used: Rolling walker (2 wheeled) Transfers: Sit to/from Stand Sit to Stand: Mod assist Stand pivot transfers: Min assist       General transfer comment: Assist to rise, stabilize, control descent. Multimodal cues for safety, technique, hand placement. Stand pivot, bed to recliner, with RW  Ambulation/Gait             General Gait Details: NT-pt too weak to attempt on today   Stairs            Wheelchair Mobility    Modified Rankin (Stroke Patients Only)       Balance Overall balance assessment: Needs assistance         Standing balance support: Bilateral upper extremity supported;During functional activity Standing balance-Leahy Scale: Poor                      Cognition Arousal/Alertness: Awake/alert Behavior During Therapy: WFL for tasks  assessed/performed Overall Cognitive Status: Within Functional Limits for tasks assessed                      Exercises      General Comments        Pertinent Vitals/Pain Pain Assessment: Faces Faces Pain Scale: Hurts little more Pain Location: abdomen Pain Descriptors / Indicators: Sore Pain Intervention(s): Limited activity within patient's tolerance;Repositioned    Home Living                      Prior Function            PT Goals (current goals can now be found in the care plan section) Progress towards PT goals: Progressing toward goals (very slowly)    Frequency  Min 3X/week    PT Plan Current plan remains appropriate    Co-evaluation             End of Session   Activity Tolerance: Patient limited by fatigue;Patient limited by pain Patient left: in chair;with call bell/phone within reach;with chair alarm set     Time: 1530-1551 PT Time Calculation (min) (ACUTE ONLY): 21 min  Charges:  $Therapeutic Activity: 8-22 mins                    G Codes:      Weston Anna, MPT Pager: [phone]

## 2015-09-24 NOTE — Progress Notes (Signed)
Spoke with pt's daughter [name] at bedside concerning discharge plans.  Pt will continue to discharge home with HHRN/NA/PT from [facility]

## 2015-09-24 NOTE — Progress Notes (Addendum)
Progress Note: General Surgery Service   Subjective: Sore, no nausea or vomiting  Objective: Vital signs in last 24 hours: Temp:  [97.8 F (36.6 C)-98.7 F (37.1 C)] 97.8 F (36.6 C) (06/23 0532) Pulse Rate:  [80-100] 81 (06/23 0532) Resp:  [14-20] 20 (06/23 0532) BP: (118-153)/(53-85) 118/53 mmHg (06/23 0532) SpO2:  [93 %-100 %] 99 % (06/23 0532) Last BM Date: 09/22/15  Intake/Output from previous day: 06/22 0701 - 06/23 0700 In: 1040 [I.V.:800; NG/GT:30; IV Piggyback:200] Out: 925 [Urine:700; Emesis/NG output:200; Blood:25] Intake/Output this shift:    Lungs: CTAB  Cardiovascular: RRR  Abd: soft, ATTP, GJ tube in place, wound c/d/i  Extremities: no edema  Neuro: AOx4  Lab Results: CBC   Recent Labs  09/23/15 0509 09/24/15 0448  WBC 12.2* 17.0*  HGB 10.5* 10.4*  HCT 29.6* 30.0*  PLT 327 267   BMET  Recent Labs  09/23/15 0509 09/24/15 0448  NA 138 140  K 3.7 3.5  CL 105 108  CO2 26 24  GLUCOSE 107* 95  BUN 6 9  CREATININE 1.04* 2.07*  CALCIUM 9.0 8.5*   PT/INR  Recent Labs  09/22/15 0457  LABPROT 15.1  INR 1.17   ABG No results for input(s): PHART, HCO3 in the last 72 hours.  Invalid input(s): PCO2, PO2  Studies/Results:  Anti-infectives: Anti-infectives    Start     Dose/Rate Route Frequency Ordered Stop   09/24/15 1800  ampicillin-sulbactam (UNASYN) 1.5 g in sodium chloride 0.9 % 50 mL IVPB     1.5 g 100 mL/hr over 30 Minutes Intravenous Every 12 hours 09/24/15 0742     09/20/15 1200  Ampicillin-Sulbactam (UNASYN) 3 g in sodium chloride 0.9 % 100 mL IVPB  Status:  Discontinued     3 g 100 mL/hr over 60 Minutes Intravenous Every 6 hours 09/20/15 0829 09/24/15 0742   09/16/15 1200  erythromycin 250 mg in sodium chloride 0.9 % 100 mL IVPB  Status:  Discontinued     250 mg 100 mL/hr over 60 Minutes Intravenous Every 6 hours 09/16/15 0846 09/17/15 1002   09/15/15 1800  ampicillin-sulbactam (UNASYN) 1.5 g in sodium chloride 0.9 %  50 mL IVPB  Status:  Discontinued     1.5 g 100 mL/hr over 30 Minutes Intravenous Every 6 hours 09/15/15 1103 09/20/15 0829   09/15/15 1715  erythromycin (E-MYCIN) tablet 250 mg  Status:  Discontinued     250 mg Oral 3 times daily before meals & bedtime 09/15/15 1656 09/16/15 0846   09/13/15 0600  vancomycin (VANCOCIN) IVPB 750 mg/150 ml premix  Status:  Discontinued     750 mg 150 mL/hr over 60 Minutes Intravenous Every 24 hours 09/12/15 0546 09/15/15 1036   09/12/15 2200  ceFEPIme (MAXIPIME) 1 g in dextrose 5 % 50 mL IVPB  Status:  Discontinued     1 g 100 mL/hr over 30 Minutes Intravenous Daily at bedtime 09/12/15 0410 09/15/15 1036   09/12/15 0430  ceFEPIme (MAXIPIME) 1 g in dextrose 5 % 50 mL IVPB     1 g 100 mL/hr over 30 Minutes Intravenous  Once 09/12/15 0427 09/12/15 0540   09/12/15 0100  vancomycin (VANCOCIN) IVPB 1000 mg/200 mL premix     1,000 mg 200 mL/hr over 60 Minutes Intravenous  Once 09/12/15 0046 09/12/15 0400   09/12/15 0100  piperacillin-tazobactam (ZOSYN) IVPB 3.375 g     3.375 g 100 mL/hr over 30 Minutes Intravenous  Once 09/12/15 0046 09/12/15 0222  Medications: Scheduled Meds: . amiodarone  100 mg Oral Daily  . ampicillin-sulbactam (UNASYN) IV  1.5 g Intravenous Q12H  . carvedilol  3.125 mg Oral BID WC  . chlorhexidine  15 mL Mouth/Throat BID  . cycloSPORINE  1 drop Both Eyes BID  . fluticasone  2 spray Each Nare Daily  . gabapentin  300 mg Oral Daily  . gabapentin  600 mg Oral 2 times per day  . latanoprost  1 drop Both Eyes QHS  . levothyroxine  44 mcg Intravenous Daily  . metoCLOPramide (REGLAN) injection  5 mg Intravenous Q8H  . mometasone-formoterol  2 puff Inhalation BID  . nitroGLYCERIN  0.5 inch Topical Q12H  . pantoprazole  40 mg Oral Daily  . polyvinyl alcohol  1 drop Both Eyes QID  . sertraline  100 mg Oral Daily   Continuous Infusions: . sodium chloride    . heparin     PRN Meds:.acetaminophen, albuterol, bisacodyl, diclofenac  sodium, hydrALAZINE, lidocaine, lip balm, metoprolol, morphine injection, ondansetron (ZOFRAN) IV, phenol  Assessment/Plan: Patient Active Problem List   Diagnosis Date Noted  . Pyloric stenosis   . Pressure ulcer 09/15/2015  . Hyperkalemia 09/12/2015  . Osteomyelitis of ankle or foot ([place])   . PVD (peripheral vascular disease) ([place])   . Diabetic osteomyelitis ([place]) 09/02/2015  . Emesis   . Adjustment disorder with anxiety 08/29/2015  . Diabetic ulcer of toe of right foot associated with type 2 diabetes mellitus, with necrosis of muscle ([place])   . Diverticulitis of large intestine without perforation or abscess without bleeding   . Colitis 08/26/2015  . Atrial fibrillation ([place]) 08/26/2015  . N&V (nausea and vomiting)   . Acute on chronic diastolic heart failure ([place]) 08/02/2015  . Esophageal candidiasis ([place])   . Goals of care, counseling/discussion   . Encounter for hospice care discussion   . Dyspnea   . SOB (shortness of breath)   . Acute renal failure superimposed on stage 3 chronic kidney disease ([place])   . Encounter for long-term (current) use of other high-risk medications   . Respiratory distress 07/06/2015  . Atrial flutter with rapid ventricular response ([place]) 07/06/2015  . Dysphagia 07/05/2015  . Asthma with allergic rhinitis 06/04/2015  . Hypertensive heart disease 03/18/2015  . S/P exploratory laparotomy 01/20/15 01/28/2015  . Malnutrition of moderate degree 01/28/2015  . Syncope 01/27/2015  . HCAP (healthcare-associated pneumonia) 01/23/2015  . Fever, unspecified 01/22/2015  . Bradycardia 01/21/2015  . Hypotension 01/21/2015  . Palliative care encounter   . DNR (do not resuscitate) discussion   . Protein-calorie malnutrition, severe 01/12/2015  . Elevated troponin 01/12/2015  . HTN (hypertension), uncontrolled 01/12/2015  . AP (abdominal pain) 01/10/2015  . SBO (small bowel obstruction) ([place]) 01/10/2015  . SIADH (syndrome of inappropriate ADH production)  ([place]) 01/10/2015  . Cerebrovascular disease 07/14/2014  . Diabetic polyneuropathy associated with type 2 diabetes mellitus ([place]) 07/14/2014  . Hyperlipidemia 07/14/2014  . Gastric distention   . Gastric outlet obstruction 05/21/2014  . Unstable gait 04/29/2014  . Hyponatremia 03/23/2014  . Acute encephalopathy 03/22/2014  . Slurred speech   . Chest pain   . Gastroparesis   . Slow transit constipation 02/03/2014  . Carotid stenosis 02/03/2014  . Demand ischemia ([place]) 01/21/2014  . Gastric bezoar 01/20/2014  . Anemia of chronic disease 01/18/2014  . CKD (chronic kidney disease) stage 3, GFR 30-59 ml/min 01/18/2014  . Depression 09/29/2013  . Perforated ulcer ([place]) 09/29/2013  . Essential hypertension, benign 09/22/2013  . GERD (  gastroesophageal reflux disease) 09/22/2013  . Dyslipidemia 09/22/2013  . DM (diabetes mellitus) type II uncontrolled, periph vascular disorder ([place]) 09/22/2013  . A-fib ([place]) 09/22/2013  . CVA (cerebral vascular accident) ([place]) 09/22/2013  . Diabetic neuropathy ([place]) 09/22/2013  . Hypothyroidism 09/22/2013  . Chronic gastrojejunal ulcer with perforation ([place]) 09/07/2013  . Diabetes ([place]) 09/07/2013  . Accelerated hypertension 09/07/2013  . Systemic lupus erythematosus (SLE) inhibitor 09/07/2013  . Other specified abnormal immunological findings in serum 09/07/2013   s/p Procedure(s): OPEN GASTROJEJUNOSTOMY TUBE PLACEMENT 09/23/2015 -begin tube feeds -dc NG tube -continue G tube to gravity -up to chair  AKI -additional fluids -continue foley for strict I/O  A fib -ok to restart heparin   LOS: 12 days   Mickeal Skinner, MD Pg# [phone] [facility]

## 2015-09-24 NOTE — Progress Notes (Signed)
Nutrition Follow-up  DOCUMENTATION CODES:   Severe malnutrition in context of acute illness/injury  INTERVENTION:  Monitor magnesium, potassium, and phosphorus daily for at least 3 days, MD to replete as needed, as pt is at risk for refeeding syndrome given severe malnutrition, poor PO intake and significant weight loss.  Tubfeeding Regimen Switch to Osmolite 1.5 @ 20 ml/hr and increase by 10 ml every 12 hours to goal rate of 45 ml/hr.  30 ml Prostat daily.  If patient not receiving IVF, recommend free water flushes of 200 ml TID. Tube feeding regimen provides 1720 kcal (101% of needs), 83 grams of protein, and 1423 ml of H2O.   NUTRITION DIAGNOSIS:   Inadequate oral intake related to inability to eat as evidenced by NPO status. -Ongoing  GOAL:   Patient will meet greater than or equal to 90% of their needs -Will meet with tubefeeding at goal rate  MONITOR:   Labs, Weight trends, Skin, I & O's  REASON FOR ASSESSMENT:   NPO/Clear Liquid Diet, LOS    ASSESSMENT:   82 y.o. female with medical history significant for hypertension, hyperlipidemia, hypothyroidism, peripheral arterial disease with ischemic toe complicated by osteomyelitis, and chronic gastric distention with recurrent nausea and vomiting who presents to the ED for evaluation of abdominal pain with nausea and vomiting of 2 days' duration. Patient had a recent admission, just discharged on 09/05/2015 following management of abdominal pain with nausea and vomiting. She had an NG tube placed at that time and was started on liquid diet, essentially improving spontaneously and tolerating full diet.  RD consulted to begin tube-feeding today. Pt denies nausea or vomiting this morning. A little pain around her GJ tube site, otherwise she is fine.  Will need monitor closely for refeeding, pt with severe malnutrition, poor PO for extended period of time PTA.  Labs and Medications reviewed: Reglan  Diet Order:  Diet  clear liquid Room service appropriate?: Yes; Fluid consistency:: Thin  Skin:  Wound (see comment) (toe wound)  Last BM:  6/16  Height:   Ht Readings from Last 1 Encounters:  09/15/15 5\' 6"  (1.676 m)    Weight:   Wt Readings from Last 1 Encounters:  09/15/15 147 lb (66.679 kg)    Ideal Body Weight:  59.1 kg  BMI:  Body mass index is 23.74 kg/(m^2).  Estimated Nutritional Needs:   Kcal:  1500-1700  Protein:  75-85g  Fluid:  1.5-1.7L/day  EDUCATION NEEDS:   No education needs identified at this time  Satira Anis. Sherrol Vicars, MS, RD LDN Inpatient Clinical Dietitian Pager [phone]

## 2015-09-25 ENCOUNTER — Inpatient Hospital Stay (HOSPITAL_COMMUNITY): Payer: Medicare Other

## 2015-09-25 LAB — OSMOLALITY, URINE: Osmolality, Ur: 463 mOsm/kg (ref 300–900)

## 2015-09-25 LAB — BASIC METABOLIC PANEL
Anion gap: 8 (ref 5–15)
BUN: 17 mg/dL (ref 6–20)
CO2: 23 mmol/L (ref 22–32)
Calcium: 8.2 mg/dL — ABNORMAL LOW (ref 8.9–10.3)
Chloride: 109 mmol/L (ref 101–111)
Creatinine, Ser: 2.77 mg/dL — ABNORMAL HIGH (ref 0.44–1.00)
GFR calc Af Amer: 16 mL/min — ABNORMAL LOW (ref >60)
GFR calc non Af Amer: 14 mL/min — ABNORMAL LOW (ref >60)
Glucose, Bld: 165 mg/dL — ABNORMAL HIGH (ref 65–99)
Potassium: 3.3 mmol/L — ABNORMAL LOW (ref 3.5–5.1)
Sodium: 140 mmol/L (ref 135–145)

## 2015-09-25 LAB — MAGNESIUM: Magnesium: 1.2 mg/dL — ABNORMAL LOW (ref 1.7–2.4)

## 2015-09-25 LAB — CBC
HCT: 26.6 % — ABNORMAL LOW (ref 36.0–46.0)
Hemoglobin: 9 g/dL — ABNORMAL LOW (ref 12.0–15.0)
MCH: 32.3 pg (ref 26.0–34.0)
MCHC: 33.8 g/dL (ref 30.0–36.0)
MCV: 95.3 fL (ref 78.0–100.0)
Platelets: 214 10*3/uL (ref 150–400)
RBC: 2.79 MIL/uL — ABNORMAL LOW (ref 3.87–5.11)
RDW: 18.2 % — ABNORMAL HIGH (ref 11.5–15.5)
WBC: 15.4 10*3/uL — ABNORMAL HIGH (ref 4.0–10.5)

## 2015-09-25 LAB — CREATININE, URINE, RANDOM: Creatinine, Urine: 190.35 mg/dL

## 2015-09-25 LAB — GLUCOSE, CAPILLARY
Glucose-Capillary: 171 mg/dL — ABNORMAL HIGH (ref 65–99)
Glucose-Capillary: 182 mg/dL — ABNORMAL HIGH (ref 65–99)

## 2015-09-25 LAB — HEPARIN LEVEL (UNFRACTIONATED)
Heparin Unfractionated: 0.32 IU/mL (ref 0.30–0.70)
Heparin Unfractionated: 0.33 IU/mL (ref 0.30–0.70)

## 2015-09-25 LAB — SODIUM, URINE, RANDOM: Sodium, Ur: 89 mmol/L

## 2015-09-25 MED ORDER — GABAPENTIN 300 MG PO CAPS
300.0000 mg | ORAL_CAPSULE | Freq: Two times a day (BID) | ORAL | Status: DC
  Administered 2015-09-25 – 2015-09-30 (×10): 300 mg via ORAL
  Filled 2015-09-25 (×10): qty 1

## 2015-09-25 MED ORDER — SODIUM CHLORIDE 0.9 % IV SOLN
1.5000 g | INTRAVENOUS | Status: DC
  Administered 2015-09-25: 1.5 g via INTRAVENOUS
  Filled 2015-09-25 (×2): qty 1.5

## 2015-09-25 MED ORDER — POTASSIUM CHLORIDE 20 MEQ/15ML (10%) PO SOLN
40.0000 meq | Freq: Every day | ORAL | Status: DC
  Administered 2015-09-25 – 2015-09-26 (×2): 40 meq
  Filled 2015-09-25 (×2): qty 30

## 2015-09-25 MED ORDER — MAGNESIUM SULFATE 2 GM/50ML IV SOLN
2.0000 g | Freq: Once | INTRAVENOUS | Status: AC
  Administered 2015-09-25: 2 g via INTRAVENOUS
  Filled 2015-09-25: qty 50

## 2015-09-25 NOTE — Progress Notes (Signed)
Pharmacy Antibiotic Note  Shannon [name] is a 82 y.o. female admitted on 09/11/2015 with pneumonia, osteomyelitis of right 3rd toe.  Pharmacy was consulted for Vancomycin, cefepime dosing.  Antibiotics changed to ampicillin/sulbactam 6/14. Day #14 antibiotics.    Plan:  Will further reduce Unasyn to 1.5 g IV q24 hr for worsening CrCl  Has completed recommended 8-10 day course for HCAP; consider transition back to Augmentin liquid for R toe osteo once G-J tube is usable (suggest 250mg /75ml x 17ml q12; may increase back to home dose of 500 mg q12 as SCr improves). Intended duration through 10/05/15.  Height: 5\' 6"  (167.6 cm) Weight: 147 lb 0.8 oz (66.7 kg) IBW/kg (Calculated) : 59.3  Temp (24hrs), Avg:98.4 F (36.9 C), Min:98.2 F (36.8 C), Max:98.6 F (37 C)   Recent Labs Lab 09/21/15 0451 09/22/15 0457 09/23/15 0509 09/24/15 0448 09/25/15 0446  WBC 10.5 10.6* 12.2* 17.0* 15.4*  CREATININE 1.19* 1.06* 1.04* 2.07* 2.77*    Estimated Creatinine Clearance: 12.6 mL/min (by C-G formula based on Cr of 2.77).    No Known Allergies  Antimicrobials this admission: Vancomycin 6/11 >> 6/14 Cefepime 6/11 >> 6/14 Amp/sulb 6/14 >> Erythromycin PO (gastric motility) 6/14 >> 6/16  Dose adjustments this admission: (previous admission, 6/1 VT = 14 on vanc 750 q24h with SCr 1.4-1.6 range) 6/19 Increase Unasyn from 1.5 to 3g q6h 6/23: back to 1.5g q12 hr  Microbiology results: 6/11 BCx: NGF 6/11 UCx: 8K insignificant growth 6/13 MRSA PCR: neg (5/27 Cdiff neg)   Thank you for allowing pharmacy to be a part of this patient's care.  Reuel Boom, PharmD, BCPS Pager: [phone] 09/25/2015, 2:56 PM

## 2015-09-25 NOTE — Progress Notes (Signed)
Progress Note: General Surgery Service   Subjective: Tolerating clears, soreness no deep pains, up to chair  Objective: Vital signs in last 24 hours: Temp:  [97.8 F (36.6 C)-98.6 F (37 C)] 98.2 F (36.8 C) (06/24 0547) Pulse Rate:  [81-85] 84 (06/24 0547) Resp:  [18-20] 18 (06/24 0547) BP: (133-151)/(52-77) 151/77 mmHg (06/24 0547) SpO2:  [99 %-100 %] 99 % (06/24 0547) Weight:  [66.7 kg (147 lb 0.8 oz)] 66.7 kg (147 lb 0.8 oz) (06/24 0547) Last BM Date: 09/24/15  Intake/Output from previous day: 06/23 0701 - 06/24 0700 In: 3064.5 [P.O.:1050; I.V.:1641.5; NG/GT:263; IV Piggyback:100] Out: 1400 [Urine:125; Drains:1275] Intake/Output this shift:    Lungs: CTAB  Cardiovascular: RRR  Abd: soft, slight pain on palpation, wound c/d/i  Extremities: no edema  Neuro: AOx4  Foley with 162ml concentrated urine  Lab Results: CBC   Recent Labs  09/24/15 0448 09/25/15 0446  WBC 17.0* 15.4*  HGB 10.4* 9.0*  HCT 30.0* 26.6*  PLT 267 214   BMET  Recent Labs  09/24/15 0448 09/25/15 0446  NA 140 140  K 3.5 3.3*  CL 108 109  CO2 24 23  GLUCOSE 95 165*  BUN 9 17  CREATININE 2.07* 2.77*  CALCIUM 8.5* 8.2*   PT/INR No results for input(s): LABPROT, INR in the last 72 hours. ABG No results for input(s): PHART, HCO3 in the last 72 hours.  Invalid input(s): PCO2, PO2  Studies/Results:  Anti-infectives: Anti-infectives    Start     Dose/Rate Route Frequency Ordered Stop   09/24/15 1800  ampicillin-sulbactam (UNASYN) 1.5 g in sodium chloride 0.9 % 50 mL IVPB     1.5 g 100 mL/hr over 30 Minutes Intravenous Every 12 hours 09/24/15 0742     09/20/15 1200  Ampicillin-Sulbactam (UNASYN) 3 g in sodium chloride 0.9 % 100 mL IVPB  Status:  Discontinued     3 g 100 mL/hr over 60 Minutes Intravenous Every 6 hours 09/20/15 0829 09/24/15 0742   09/16/15 1200  erythromycin 250 mg in sodium chloride 0.9 % 100 mL IVPB  Status:  Discontinued     250 mg 100 mL/hr over 60  Minutes Intravenous Every 6 hours 09/16/15 0846 09/17/15 1002   09/15/15 1800  ampicillin-sulbactam (UNASYN) 1.5 g in sodium chloride 0.9 % 50 mL IVPB  Status:  Discontinued     1.5 g 100 mL/hr over 30 Minutes Intravenous Every 6 hours 09/15/15 1103 09/20/15 0829   09/15/15 1715  erythromycin (E-MYCIN) tablet 250 mg  Status:  Discontinued     250 mg Oral 3 times daily before meals & bedtime 09/15/15 1656 09/16/15 0846   09/13/15 0600  vancomycin (VANCOCIN) IVPB 750 mg/150 ml premix  Status:  Discontinued     750 mg 150 mL/hr over 60 Minutes Intravenous Every 24 hours 09/12/15 0546 09/15/15 1036   09/12/15 2200  ceFEPIme (MAXIPIME) 1 g in dextrose 5 % 50 mL IVPB  Status:  Discontinued     1 g 100 mL/hr over 30 Minutes Intravenous Daily at bedtime 09/12/15 0410 09/15/15 1036   09/12/15 0430  ceFEPIme (MAXIPIME) 1 g in dextrose 5 % 50 mL IVPB     1 g 100 mL/hr over 30 Minutes Intravenous  Once 09/12/15 0427 09/12/15 0540   09/12/15 0100  vancomycin (VANCOCIN) IVPB 1000 mg/200 mL premix     1,000 mg 200 mL/hr over 60 Minutes Intravenous  Once 09/12/15 0046 09/12/15 0400   09/12/15 0100  piperacillin-tazobactam (ZOSYN) IVPB 3.375 g  3.375 g 100 mL/hr over 30 Minutes Intravenous  Once 09/12/15 0046 09/12/15 0222      Medications: Scheduled Meds: . amiodarone  100 mg Oral Daily  . ampicillin-sulbactam (UNASYN) IV  1.5 g Intravenous Q12H  . carvedilol  3.125 mg Oral BID WC  . chlorhexidine  15 mL Mouth/Throat BID  . cycloSPORINE  1 drop Both Eyes BID  . feeding supplement (PRO-STAT SUGAR FREE 64)  30 mL Per Tube Daily  . fluticasone  2 spray Each Nare Daily  . free water  60 mL Per Tube Q6H  . gabapentin  300 mg Oral Daily  . gabapentin  600 mg Oral 2 times per day  . latanoprost  1 drop Both Eyes QHS  . levothyroxine  44 mcg Intravenous Daily  . magnesium sulfate 1 - 4 g bolus IVPB  2 g Intravenous Once  . metoCLOPramide (REGLAN) injection  5 mg Intravenous Q8H  .  mometasone-formoterol  2 puff Inhalation BID  . nitroGLYCERIN  0.5 inch Topical Q12H  . pantoprazole  40 mg Oral Daily  . polyvinyl alcohol  1 drop Both Eyes QID  . potassium chloride  40 mEq Per Tube Daily  . sertraline  100 mg Oral Daily   Continuous Infusions: . sodium chloride 75 mL/hr at 09/25/15 0357  . feeding supplement (OSMOLITE 1.5 CAL) 1,000 mL (09/24/15 1158)  . heparin 1,050 Units/hr (09/25/15 0538)   PRN Meds:.acetaminophen, albuterol, bisacodyl, hydrALAZINE, lidocaine, lip balm, metoprolol, morphine injection, ondansetron (ZOFRAN) IV, phenol  Assessment/Plan: Patient Active Problem List   Diagnosis Date Noted  . Pyloric stenosis   . Pressure ulcer 09/15/2015  . Hyperkalemia 09/12/2015  . Osteomyelitis of ankle or foot ([place])   . PVD (peripheral vascular disease) ([place])   . Diabetic osteomyelitis ([place]) 09/02/2015  . Emesis   . Adjustment disorder with anxiety 08/29/2015  . Diabetic ulcer of toe of right foot associated with type 2 diabetes mellitus, with necrosis of muscle ([place])   . Diverticulitis of large intestine without perforation or abscess without bleeding   . Colitis 08/26/2015  . Atrial fibrillation ([place]) 08/26/2015  . N&V (nausea and vomiting)   . Acute on chronic diastolic heart failure ([place]) 08/02/2015  . Esophageal candidiasis ([place])   . Goals of care, counseling/discussion   . Encounter for hospice care discussion   . Dyspnea   . SOB (shortness of breath)   . Acute renal failure superimposed on stage 3 chronic kidney disease ([place])   . Encounter for long-term (current) use of other high-risk medications   . Respiratory distress 07/06/2015  . Atrial flutter with rapid ventricular response ([place]) 07/06/2015  . Dysphagia 07/05/2015  . Asthma with allergic rhinitis 06/04/2015  . Hypertensive heart disease 03/18/2015  . S/P exploratory laparotomy 01/20/15 01/28/2015  . Malnutrition of moderate degree 01/28/2015  . Syncope 01/27/2015  . HCAP  (healthcare-associated pneumonia) 01/23/2015  . Fever, unspecified 01/22/2015  . Bradycardia 01/21/2015  . Hypotension 01/21/2015  . Palliative care encounter   . DNR (do not resuscitate) discussion   . Protein-calorie malnutrition, severe 01/12/2015  . Elevated troponin 01/12/2015  . HTN (hypertension), uncontrolled 01/12/2015  . AP (abdominal pain) 01/10/2015  . SBO (small bowel obstruction) ([place]) 01/10/2015  . SIADH (syndrome of inappropriate ADH production) ([place]) 01/10/2015  . Cerebrovascular disease 07/14/2014  . Diabetic polyneuropathy associated with type 2 diabetes mellitus ([place]) 07/14/2014  . Hyperlipidemia 07/14/2014  . Gastric distention   . Gastric outlet obstruction 05/21/2014  . Unstable gait 04/29/2014  .  Hyponatremia 03/23/2014  . Acute encephalopathy 03/22/2014  . Slurred speech   . Chest pain   . Gastroparesis   . Slow transit constipation 02/03/2014  . Carotid stenosis 02/03/2014  . Demand ischemia ([place]) 01/21/2014  . Gastric bezoar 01/20/2014  . Anemia of chronic disease 01/18/2014  . CKD (chronic kidney disease) stage 3, GFR 30-59 ml/min 01/18/2014  . Depression 09/29/2013  . Perforated ulcer ([place]) 09/29/2013  . Essential hypertension, benign 09/22/2013  . GERD (gastroesophageal reflux disease) 09/22/2013  . Dyslipidemia 09/22/2013  . DM (diabetes mellitus) type II uncontrolled, periph vascular disorder ([place]) 09/22/2013  . A-fib ([place]) 09/22/2013  . CVA (cerebral vascular accident) ([place]) 09/22/2013  . Diabetic neuropathy ([place]) 09/22/2013  . Hypothyroidism 09/22/2013  . Chronic gastrojejunal ulcer with perforation ([place]) 09/07/2013  . Diabetes ([place]) 09/07/2013  . Accelerated hypertension 09/07/2013  . Systemic lupus erythematosus (SLE) inhibitor 09/07/2013  . Other specified abnormal immunological findings in serum 09/07/2013   s/p Procedure(s): OPEN GASTROJEJUNOSTOMY TUBE PLACEMENT 09/23/2015 -near goal for tube feeds -advance to fulls, can clamp G  tube intermittently and vent if nauseated -continue strict I/O for AKI, urine output appears to be improving this morning (134ml in bag now) -continue IVF -stopped dermal diclofenac (minor nephrotoxicity issue)   LOS: 13 days   Mickeal Skinner, MD Pg# [phone] [facility]

## 2015-09-25 NOTE — Progress Notes (Signed)
[place] for Apixaban --> heparin Indication: Atrial Fibrillation  No Known Allergies  Patient Measurements: Height: 5\' 6"  (167.6 cm) Weight: 147 lb 0.8 oz (66.7 kg) IBW/kg (Calculated) : 59.3 Heparin Dosing Weight: 66kg  Vital Signs: Temp: 98.2 F (36.8 C) (06/24 0547) Temp Source: Oral (06/24 0547) BP: 151/77 mmHg (06/24 0547) Pulse Rate: 84 (06/24 0547)  Labs:  Recent Labs  09/23/15 0509 09/23/15 1020 09/24/15 0448 09/24/15 1950 09/25/15 0446 09/25/15 1359  HGB 10.5*  --  10.4*  --  9.0*  --   HCT 29.6*  --  30.0*  --  26.6*  --   PLT 327  --  267  --  214  --   APTT  --  30  --   --   --   --   HEPARINUNFRC 0.44  --  <0.10* 0.19* 0.32 0.33  CREATININE 1.04*  --  2.07*  --  2.77*  --     Estimated Creatinine Clearance: 12.6 mL/min (by C-G formula based on Cr of 2.77).  Assessment: 77 yoF on chronic apixaban for hx atrial fibrillation hospitalized for GOO, transitioned prior to admission apixaban to IV heparin at admission for EGD on 6/14.  She required dilation of moderate stenosis and suction of retained fluid and small food fragments. GI ok with resuming anticoagulation 6/15.  Patient initially tolerated diet, but again having abd distention and nausea requiring NGT placement.  Pharmacy consulted to resume heparin gtt again perioperatively for GJ tube placement  Significant events 6/22 - Underwent successful open GJ tube placement; Heparin turned off at 6a 6/23: heparin resumed at 12p  PTA apixaban 2.5mg  PO BID  09/25/2015  HL now therapeutic on 1050 units/hr, low end of range  CBC: Hgb low/stable, Pltc WNL  No bleeding documented per RN  CrCl low at 13 ml/hr  Goal of Therapy:  Monitor platelets by anticoagulation protocol: Yes  Heparin level 0.3-0.7   Plan:   Continue IV heparin at 1000 units/hr  Daily CBC, daily heparin level once stable  Monitor for signs of bleeding or thrombosis    Reuel Boom,  PharmD, BCPS Pager: [phone] 09/25/2015, 2:52 PM

## 2015-09-25 NOTE — Progress Notes (Signed)
PROGRESS NOTE    Shannon [name]  ZSW:[id number] DOB: [birth year] DOA: 09/11/2015 PCP: Kirt Boys, DO   Brief Narrative:  HPI on 09/12/2015 by Dr. Marcial Pacas Opyd Shannon [name] is a medically complex 82 y.o. female with medical history significant for hypertension, hyperlipidemia, hypothyroidism, peripheral arterial disease with ischemic toe complicated by osteomyelitis, and chronic gastric distention with recurrent nausea and vomiting who presents to the ED for evaluation of abdominal pain with nausea and vomiting of 2 days' duration. Patient had a recent admission, just discharged on 09/05/2015 following management of abdominal pain with nausea and vomiting. She had an NG tube placed at that time and was started on liquid diet, essentially improving spontaneously and tolerating full diet. She was also noted to have an ischemic third toe on the right foot secondary to PAD and workup revealed underlying osteomyelitis. Surgery was advised for management of this, however patient declined. She was discharged with a one-month course of Augmentin. She had initially done well back at home before the insidious development of recurrent abdominal pain with nausea and vomiting. She reports the vomitus as nonbloody and nonbilious. There's been no diarrhea; in fact she has not had a bowel movement in 2-3 days. She denies chest pain or palpitations, but does endorse dyspnea with minimal exertion. She denies any significant cough.  Interim history Patient found to have pyloric stenosis. Had EGD with dilatation. Patient was also treated for healthcare associated pneumonia. S/p GJ tube placement. Developed AKI- renal US pending.   Assessment & Plan   HCAP  -likely aspiration, from gastric outlet obstruction  -Currently appears nontoxic currently afebrile -Was treated with vancomycin and cefepime and transitioned to Unasyn -cultures negative  Gastric outlet obstruction due to pyloric stenosis -Initially there was  improvement after EGD and dilatation but still not back to baseline. -On 09/18/2015 patient has re-insertion of NG tube due to abdominal distention with significant output. -As per GI patient may need postpyloric feeding tube placement if she is unable to tolerate oral diet. -s/p open GJ tube placement by general surgery, Dr. Sheliah Hatch -Nutrition consulted for tube feeds -Tolerated clears yesterday, surgery advanced diet to full liquids today  Acute kidney injury -Likely secondary to contrast vs meds -Creatinine 2.77 (Cr may continue to rise and eventually plateau)  -Placed on gentle IVF -Will obtain renal US -Continue to monitor I/O and BMP -gabapentin dose renally adjusted  Right third toe gangrene  -On Augmentin as an outpatient and Unasyn in the hospital, refused amputation last admission, again refuses, confirmed with her daughter.  -Suppressive antibiotic course ongoing for 30 days.  -After antibiotic course she wants to continue with wound care and supportive care.  -If worsening then daughter requested to consider comfort care. -Last day antibiotics 10/07/2015. -Wound care consulted  Dyslipidemia -On statin.  Depression  -On Zoloft.  Hypothyroidism  -recently Synthroid increased to 88 g few days ago, repeat TSH in 4 weeks from now. -Change to IV Synthroid at present.  GERD -On PPI  Paroxysmal atrial fibrillation with RVR -CHADSVASC 5 -Continue amiodarone and transitioned to IV heparin again in the setting of patient remaining nothing by mouth.  Normocytic anemia -Hemoglobin appears to be at baseline, currently 9 -Continue to monitor CBC  History of asthma/COPD -Stable.  Hypokalemia  -Replacing, continue to monitor BMP  DVT Prophylaxis    Code Status: partial code, DNR, DNI  Family Communication: None at bedside  Disposition Plan: Admitted. Pending improvement in creatinine and further recommendations from  surgery  Consultants Gastroenterology General  surgery  Procedures  EGD Open placement of gastrojejunostomy tube  Antibiotics   Anti-infectives    Start     Dose/Rate Route Frequency Ordered Stop   09/24/15 1800  ampicillin-sulbactam (UNASYN) 1.5 g in sodium chloride 0.9 % 50 mL IVPB     1.5 g 100 mL/hr over 30 Minutes Intravenous Every 12 hours 09/24/15 0742     09/20/15 1200  Ampicillin-Sulbactam (UNASYN) 3 g in sodium chloride 0.9 % 100 mL IVPB  Status:  Discontinued     3 g 100 mL/hr over 60 Minutes Intravenous Every 6 hours 09/20/15 0829 09/24/15 0742   09/16/15 1200  erythromycin 250 mg in sodium chloride 0.9 % 100 mL IVPB  Status:  Discontinued     250 mg 100 mL/hr over 60 Minutes Intravenous Every 6 hours 09/16/15 0846 09/17/15 1002   09/15/15 1800  ampicillin-sulbactam (UNASYN) 1.5 g in sodium chloride 0.9 % 50 mL IVPB  Status:  Discontinued     1.5 g 100 mL/hr over 30 Minutes Intravenous Every 6 hours 09/15/15 1103 09/20/15 0829   09/15/15 1715  erythromycin (E-MYCIN) tablet 250 mg  Status:  Discontinued     250 mg Oral 3 times daily before meals & bedtime 09/15/15 1656 09/16/15 0846   09/13/15 0600  vancomycin (VANCOCIN) IVPB 750 mg/150 ml premix  Status:  Discontinued     750 mg 150 mL/hr over 60 Minutes Intravenous Every 24 hours 09/12/15 0546 09/15/15 1036   09/12/15 2200  ceFEPIme (MAXIPIME) 1 g in dextrose 5 % 50 mL IVPB  Status:  Discontinued     1 g 100 mL/hr over 30 Minutes Intravenous Daily at bedtime 09/12/15 0410 09/15/15 1036   09/12/15 0430  ceFEPIme (MAXIPIME) 1 g in dextrose 5 % 50 mL IVPB     1 g 100 mL/hr over 30 Minutes Intravenous  Once 09/12/15 0427 09/12/15 0540   09/12/15 0100  vancomycin (VANCOCIN) IVPB 1000 mg/200 mL premix     1,000 mg 200 mL/hr over 60 Minutes Intravenous  Once 09/12/15 0046 09/12/15 0400   09/12/15 0100  piperacillin-tazobactam (ZOSYN) IVPB 3.375 g     3.375 g 100 mL/hr over 30 Minutes Intravenous  Once 09/12/15 0046  09/12/15 0222      Subjective:   [name] seen and examined today. Patient states she is feeling better today. Complains of abdominal soreness.  She was started on tube feeds yesterday and was also able to tolerate clears.  Currently denies nausea.  Denies chest pain, shortness of breath, dizziness, headache.  Objective:   Filed Vitals:   09/24/15 0804 09/24/15 1344 09/24/15 1957 09/25/15 0547  BP: 125/50 137/63 133/52 151/77  Pulse: 73 81 85 84  Temp:  97.8 F (36.6 C) 98.6 F (37 C) 98.2 F (36.8 C)  TempSrc:  Oral Oral Oral  Resp:  20 20 18   Height:      Weight:    66.7 kg (147 lb 0.8 oz)  SpO2:  99% 100% 99%    Intake/Output Summary (Last 24 hours) at 09/25/15 1152 Last data filed at 09/25/15 1035  Gross per 24 hour  Intake 3294.45 ml  Output   2000 ml  Net 1294.45 ml   Filed Weights   09/12/15 0241 09/15/15 1412 09/25/15 0547  Weight: 66.724 kg (147 lb 1.6 oz) 66.679 kg (147 lb) 66.7 kg (147 lb 0.8 oz)    Exam  General: Well developed, well nourished, elderly, NAD  HEENT: NCAT, mucous membranes moist.   Cardiovascular:  S1 S2 auscultated, no murmur  Respiratory: Clear to auscultation  Abdomen: Soft, mild TTP, mildly distended, + bowel sounds. Dressing clean.  Extremities: warm dry without cyanosis clubbing or edema  Neuro: AAOx3, nonfocal   Psych: Appropriate mood and affect, pleasant   Data Reviewed: I have personally reviewed following labs and imaging studies  CBC:  Recent Labs Lab 09/19/15 0418  09/21/15 0451 09/22/15 0457 09/23/15 0509 09/24/15 0448 09/25/15 0446  WBC 10.9*  < > 10.5 10.6* 12.2* 17.0* 15.4*  NEUTROABS 2.9  --   --   --   --   --   --   HGB 10.3*  < > 9.6* 9.8* 10.5* 10.4* 9.0*  HCT 29.3*  < > 27.0* 27.7* 29.6* 30.0* 26.6*  MCV 93.6  < > 94.4 93.9 94.0 96.5 95.3  PLT 327  < > 333 312 327 267 214  < > = values in this interval not displayed. Basic Metabolic Panel:  Recent Labs Lab 09/19/15 0418 09/21/15 0448  09/21/15 0451 09/22/15 0457 09/23/15 0509 09/24/15 0448 09/25/15 0446 09/25/15 0820  NA  --   --  137 138 138 140 140  --   K  --   --  2.9* 3.2* 3.7 3.5 3.3*  --   CL  --   --  101 103 105 108 109  --   CO2  --   --  20* 26 26 24 23   --   GLUCOSE  --   --  36* 81 107* 95 165*  --   BUN  --   --  7 6 6 9 17   --   CREATININE  --   --  1.19* 1.06* 1.04* 2.07* 2.77*  --   CALCIUM  --   --  8.7* 8.8* 9.0 8.5* 8.2*  --   MG 1.6* 1.6*  --   --   --   --   --  1.2*   GFR: Estimated Creatinine Clearance: 12.6 mL/min (by C-G formula based on Cr of 2.77). Liver Function Tests: No results for input(s): AST, ALT, ALKPHOS, BILITOT, PROT, ALBUMIN in the last 168 hours. No results for input(s): LIPASE, AMYLASE in the last 168 hours. No results for input(s): AMMONIA in the last 168 hours. Coagulation Profile:  Recent Labs Lab 09/22/15 0457  INR 1.17   Cardiac Enzymes: No results for input(s): CKTOTAL, CKMB, CKMBINDEX, TROPONINI in the last 168 hours. BNP (last 3 results) No results for input(s): PROBNP in the last 8760 hours. HbA1C: No results for input(s): HGBA1C in the last 72 hours. CBG:  Recent Labs Lab 09/24/15 1622 09/24/15 1956 09/24/15 2344 09/25/15 0542 09/25/15 0741  GLUCAP 118* 149* 152* 171* 182*   Lipid Profile: No results for input(s): CHOL, HDL, LDLCALC, TRIG, CHOLHDL, LDLDIRECT in the last 72 hours. Thyroid Function Tests: No results for input(s): TSH, T4TOTAL, FREET4, T3FREE, THYROIDAB in the last 72 hours. Anemia Panel: No results for input(s): VITAMINB12, FOLATE, FERRITIN, TIBC, IRON, RETICCTPCT in the last 72 hours. Urine analysis:    Component Value Date/Time   COLORURINE YELLOW 09/12/2015 0036   APPEARANCEUR CLEAR 09/12/2015 0036   LABSPEC 1.025 09/12/2015 0036   PHURINE 6.5 09/12/2015 0036   GLUCOSEU NEGATIVE 09/12/2015 0036   HGBUR NEGATIVE 09/12/2015 0036   BILIRUBINUR NEGATIVE 09/12/2015 0036   KETONESUR NEGATIVE 09/12/2015 0036   PROTEINUR  100* 09/12/2015 0036   UROBILINOGEN 0.2 01/27/2015 2308   NITRITE NEGATIVE 09/12/2015 0036   LEUKOCYTESUR NEGATIVE 09/12/2015 0036   Sepsis  Labs: @LABRCNTIP (procalcitonin:4,lacticidven:4)  ) No results found for this or any previous visit (from the past 240 hour(s)).    Radiology Studies: Dg Abd 1 View  09/23/2015  CLINICAL DATA:  Gastrostomy tube placement. EXAM: ABDOMEN - 1 VIEW; DG C-ARM 1-60 MIN-NO REPORT COMPARISON:  09/22/2015 upper GI. FINDINGS: Fluoroscopy time 0.5 minutes. Single nondiagnostic spot fluoroscopic intraoperative radiograph demonstrates gastrostomy tube tip in the third portion of duodenum. IMPRESSION: Intraoperative fluoroscopic guidance for gastrostomy tube placement. Electronically Signed   By: Delbert Phenix M.D.   On: 09/23/2015 13:36   Dg C-arm 1-60 Min-no Report  09/23/2015  CLINICAL DATA: surgery C-ARM 1-60 MINUTES Fluoroscopy was utilized by the requesting physician.  No radiographic interpretation.     Scheduled Meds: . amiodarone  100 mg Oral Daily  . ampicillin-sulbactam (UNASYN) IV  1.5 g Intravenous Q12H  . carvedilol  3.125 mg Oral BID WC  . chlorhexidine  15 mL Mouth/Throat BID  . cycloSPORINE  1 drop Both Eyes BID  . feeding supplement (PRO-STAT SUGAR FREE 64)  30 mL Per Tube Daily  . fluticasone  2 spray Each Nare Daily  . free water  60 mL Per Tube Q6H  . gabapentin  300 mg Oral Daily  . gabapentin  600 mg Oral 2 times per day  . latanoprost  1 drop Both Eyes QHS  . levothyroxine  44 mcg Intravenous Daily  . metoCLOPramide (REGLAN) injection  5 mg Intravenous Q8H  . mometasone-formoterol  2 puff Inhalation BID  . nitroGLYCERIN  0.5 inch Topical Q12H  . pantoprazole  40 mg Oral Daily  . polyvinyl alcohol  1 drop Both Eyes QID  . potassium chloride  40 mEq Per Tube Daily  . sertraline  100 mg Oral Daily   Continuous Infusions: . sodium chloride 75 mL/hr at 09/25/15 0357  . feeding supplement (OSMOLITE 1.5 CAL) 1,000 mL (09/24/15 1158)   . heparin 1,050 Units/hr (09/25/15 0538)     LOS: 13 days   Time Spent in minutes   30 minutes  Quetzaly Ebner D.O. on 09/25/2015 at 11:52 AM  Between 7am to 7pm - Pager - [phone]  After 7pm go to [email or web address] - password [email or web address]  And look for the night coverage person covering for me after hours  [facility] Office  [phone]

## 2015-09-26 DIAGNOSIS — F329 Major depressive disorder, single episode, unspecified: Secondary | ICD-10-CM

## 2015-09-26 DIAGNOSIS — E876 Hypokalemia: Secondary | ICD-10-CM

## 2015-09-26 DIAGNOSIS — E1152 Type 2 diabetes mellitus with diabetic peripheral angiopathy with gangrene: Secondary | ICD-10-CM

## 2015-09-26 DIAGNOSIS — E1122 Type 2 diabetes mellitus with diabetic chronic kidney disease: Secondary | ICD-10-CM

## 2015-09-26 DIAGNOSIS — N179 Acute kidney failure, unspecified: Secondary | ICD-10-CM

## 2015-09-26 DIAGNOSIS — J189 Pneumonia, unspecified organism: Secondary | ICD-10-CM

## 2015-09-26 DIAGNOSIS — E785 Hyperlipidemia, unspecified: Secondary | ICD-10-CM

## 2015-09-26 DIAGNOSIS — I129 Hypertensive chronic kidney disease with stage 1 through stage 4 chronic kidney disease, or unspecified chronic kidney disease: Secondary | ICD-10-CM

## 2015-09-26 DIAGNOSIS — Z931 Gastrostomy status: Secondary | ICD-10-CM

## 2015-09-26 DIAGNOSIS — D631 Anemia in chronic kidney disease: Secondary | ICD-10-CM

## 2015-09-26 DIAGNOSIS — Y95 Nosocomial condition: Secondary | ICD-10-CM

## 2015-09-26 DIAGNOSIS — Z79899 Other long term (current) drug therapy: Secondary | ICD-10-CM

## 2015-09-26 DIAGNOSIS — N189 Chronic kidney disease, unspecified: Secondary | ICD-10-CM

## 2015-09-26 DIAGNOSIS — Z7901 Long term (current) use of anticoagulants: Secondary | ICD-10-CM

## 2015-09-26 DIAGNOSIS — I480 Paroxysmal atrial fibrillation: Secondary | ICD-10-CM

## 2015-09-26 LAB — BASIC METABOLIC PANEL
Anion gap: 6 (ref 5–15)
BUN: 18 mg/dL (ref 6–20)
CO2: 24 mmol/L (ref 22–32)
Calcium: 8.4 mg/dL — ABNORMAL LOW (ref 8.9–10.3)
Chloride: 112 mmol/L — ABNORMAL HIGH (ref 101–111)
Creatinine, Ser: 2.31 mg/dL — ABNORMAL HIGH (ref 0.44–1.00)
GFR calc Af Amer: 20 mL/min — ABNORMAL LOW (ref >60)
GFR calc non Af Amer: 17 mL/min — ABNORMAL LOW (ref >60)
Glucose, Bld: 153 mg/dL — ABNORMAL HIGH (ref 65–99)
Potassium: 3.1 mmol/L — ABNORMAL LOW (ref 3.5–5.1)
Sodium: 142 mmol/L (ref 135–145)

## 2015-09-26 LAB — TYPE AND SCREEN
ABO/RH(D): AB POS
Antibody Screen: POSITIVE
DAT, IgG: POSITIVE
Donor AG Type: NEGATIVE
Unit division: 0

## 2015-09-26 LAB — GLUCOSE, CAPILLARY
Glucose-Capillary: 139 mg/dL — ABNORMAL HIGH (ref 65–99)
Glucose-Capillary: 145 mg/dL — ABNORMAL HIGH (ref 65–99)
Glucose-Capillary: 155 mg/dL — ABNORMAL HIGH (ref 65–99)
Glucose-Capillary: 157 mg/dL — ABNORMAL HIGH (ref 65–99)
Glucose-Capillary: 160 mg/dL — ABNORMAL HIGH (ref 65–99)
Glucose-Capillary: 164 mg/dL — ABNORMAL HIGH (ref 65–99)

## 2015-09-26 LAB — HEPARIN LEVEL (UNFRACTIONATED): Heparin Unfractionated: 0.43 IU/mL (ref 0.30–0.70)

## 2015-09-26 MED ORDER — POTASSIUM CHLORIDE 20 MEQ/15ML (10%) PO SOLN
60.0000 meq | Freq: Every day | ORAL | Status: DC
  Administered 2015-09-27 – 2015-09-30 (×4): 60 meq
  Filled 2015-09-26 (×4): qty 45

## 2015-09-26 MED ORDER — SODIUM CHLORIDE 0.9 % IV SOLN
1.5000 g | Freq: Two times a day (BID) | INTRAVENOUS | Status: DC
  Administered 2015-09-26 – 2015-09-27 (×2): 1.5 g via INTRAVENOUS
  Filled 2015-09-26 (×2): qty 1.5

## 2015-09-26 NOTE — Progress Notes (Signed)
Progress Note: General Surgery Service   Subjective: Tolerating liquids, +flatus overnight, up to chair yesterday  Objective: Vital signs in last 24 hours: Temp:  [97.8 F (36.6 C)-98.4 F (36.9 C)] 98.4 F (36.9 C) (06/25 0532) Pulse Rate:  [75-94] 94 (06/25 0532) Resp:  [18-20] 18 (06/25 0532) BP: (144-153)/(63-85) 153/85 mmHg (06/25 0532) SpO2:  [100 %] 100 % (06/25 0532) Weight:  [70.4 kg (155 lb 3.3 oz)] 70.4 kg (155 lb 3.3 oz) (06/25 0532) Last BM Date: 09/24/15  Intake/Output from previous day: 06/24 0701 - 06/25 0700 In: 2364.8 [P.O.:840; I.V.:1344.8; NG/GT:180] Out: 3325 [Urine:625; Drains:2700] Intake/Output this shift: Total I/O In: 1205.5 [I.V.:648.8; NG/GT:506.8; IV Piggyback:50] Out: -   Lungs: CTAB  Cardiovascular: RRR  Abd: soft, NT, ND, wound c/d/i  Extremities: no edema  Neuro: AOx4  Lab Results: CBC   Recent Labs  09/24/15 0448 09/25/15 0446  WBC 17.0* 15.4*  HGB 10.4* 9.0*  HCT 30.0* 26.6*  PLT 267 214   BMET  Recent Labs  09/25/15 0446 09/26/15 0503  NA 140 142  K 3.3* 3.1*  CL 109 112*  CO2 23 24  GLUCOSE 165* 153*  BUN 17 18  CREATININE 2.77* 2.31*  CALCIUM 8.2* 8.4*   PT/INR No results for input(s): LABPROT, INR in the last 72 hours. ABG No results for input(s): PHART, HCO3 in the last 72 hours.  Invalid input(s): PCO2, PO2  Studies/Results:  Anti-infectives: Anti-infectives    Start     Dose/Rate Route Frequency Ordered Stop   09/25/15 2200  ampicillin-sulbactam (UNASYN) 1.5 g in sodium chloride 0.9 % 50 mL IVPB     1.5 g 100 mL/hr over 30 Minutes Intravenous Every 24 hours 09/25/15 1333     09/24/15 1800  ampicillin-sulbactam (UNASYN) 1.5 g in sodium chloride 0.9 % 50 mL IVPB  Status:  Discontinued     1.5 g 100 mL/hr over 30 Minutes Intravenous Every 12 hours 09/24/15 0742 09/25/15 1333   09/20/15 1200  Ampicillin-Sulbactam (UNASYN) 3 g in sodium chloride 0.9 % 100 mL IVPB  Status:  Discontinued     3  g 100 mL/hr over 60 Minutes Intravenous Every 6 hours 09/20/15 0829 09/24/15 0742   09/16/15 1200  erythromycin 250 mg in sodium chloride 0.9 % 100 mL IVPB  Status:  Discontinued     250 mg 100 mL/hr over 60 Minutes Intravenous Every 6 hours 09/16/15 0846 09/17/15 1002   09/15/15 1800  ampicillin-sulbactam (UNASYN) 1.5 g in sodium chloride 0.9 % 50 mL IVPB  Status:  Discontinued     1.5 g 100 mL/hr over 30 Minutes Intravenous Every 6 hours 09/15/15 1103 09/20/15 0829   09/15/15 1715  erythromycin (E-MYCIN) tablet 250 mg  Status:  Discontinued     250 mg Oral 3 times daily before meals & bedtime 09/15/15 1656 09/16/15 0846   09/13/15 0600  vancomycin (VANCOCIN) IVPB 750 mg/150 ml premix  Status:  Discontinued     750 mg 150 mL/hr over 60 Minutes Intravenous Every 24 hours 09/12/15 0546 09/15/15 1036   09/12/15 2200  ceFEPIme (MAXIPIME) 1 g in dextrose 5 % 50 mL IVPB  Status:  Discontinued     1 g 100 mL/hr over 30 Minutes Intravenous Daily at bedtime 09/12/15 0410 09/15/15 1036   09/12/15 0430  ceFEPIme (MAXIPIME) 1 g in dextrose 5 % 50 mL IVPB     1 g 100 mL/hr over 30 Minutes Intravenous  Once 09/12/15 0427 09/12/15 0540   09/12/15 0100  vancomycin (VANCOCIN) IVPB 1000 mg/200 mL premix     1,000 mg 200 mL/hr over 60 Minutes Intravenous  Once 09/12/15 0046 09/12/15 0400   09/12/15 0100  piperacillin-tazobactam (ZOSYN) IVPB 3.375 g     3.375 g 100 mL/hr over 30 Minutes Intravenous  Once 09/12/15 0046 09/12/15 0222      Medications: Scheduled Meds: . amiodarone  100 mg Oral Daily  . ampicillin-sulbactam (UNASYN) IV  1.5 g Intravenous Q24H  . carvedilol  3.125 mg Oral BID WC  . chlorhexidine  15 mL Mouth/Throat BID  . cycloSPORINE  1 drop Both Eyes BID  . feeding supplement (PRO-STAT SUGAR FREE 64)  30 mL Per Tube Daily  . fluticasone  2 spray Each Nare Daily  . free water  60 mL Per Tube Q6H  . gabapentin  300 mg Oral BID  . latanoprost  1 drop Both Eyes QHS  . levothyroxine  44  mcg Intravenous Daily  . metoCLOPramide (REGLAN) injection  5 mg Intravenous Q8H  . mometasone-formoterol  2 puff Inhalation BID  . nitroGLYCERIN  0.5 inch Topical Q12H  . pantoprazole  40 mg Oral Daily  . polyvinyl alcohol  1 drop Both Eyes QID  . potassium chloride  40 mEq Per Tube Daily  . sertraline  100 mg Oral Daily   Continuous Infusions: . sodium chloride 75 mL/hr at 09/26/15 0739  . feeding supplement (OSMOLITE 1.5 CAL) 1,000 mL (09/25/15 2306)  . heparin 1,050 Units/hr (09/25/15 1225)   PRN Meds:.acetaminophen, albuterol, bisacodyl, hydrALAZINE, lidocaine, lip balm, metoprolol, morphine injection, ondansetron (ZOFRAN) IV, phenol  Assessment/Plan: Patient Active Problem List   Diagnosis Date Noted  . Pyloric stenosis   . Pressure ulcer 09/15/2015  . Hyperkalemia 09/12/2015  . Osteomyelitis of ankle or foot ([place])   . PVD (peripheral vascular disease) ([place])   . Diabetic osteomyelitis ([place]) 09/02/2015  . Emesis   . Adjustment disorder with anxiety 08/29/2015  . Diabetic ulcer of toe of right foot associated with type 2 diabetes mellitus, with necrosis of muscle ([place])   . Diverticulitis of large intestine without perforation or abscess without bleeding   . Colitis 08/26/2015  . Atrial fibrillation ([place]) 08/26/2015  . N&V (nausea and vomiting)   . Acute on chronic diastolic heart failure ([place]) 08/02/2015  . Esophageal candidiasis ([place])   . Goals of care, counseling/discussion   . Encounter for hospice care discussion   . Dyspnea   . SOB (shortness of breath)   . Acute renal failure superimposed on stage 3 chronic kidney disease ([place])   . Encounter for long-term (current) use of other high-risk medications   . Respiratory distress 07/06/2015  . Atrial flutter with rapid ventricular response ([place]) 07/06/2015  . Dysphagia 07/05/2015  . Asthma with allergic rhinitis 06/04/2015  . Hypertensive heart disease 03/18/2015  . S/P exploratory laparotomy 01/20/15 01/28/2015  .  Malnutrition of moderate degree 01/28/2015  . Syncope 01/27/2015  . HCAP (healthcare-associated pneumonia) 01/23/2015  . Fever, unspecified 01/22/2015  . Bradycardia 01/21/2015  . Hypotension 01/21/2015  . Palliative care encounter   . DNR (do not resuscitate) discussion   . Protein-calorie malnutrition, severe 01/12/2015  . Elevated troponin 01/12/2015  . HTN (hypertension), uncontrolled 01/12/2015  . AP (abdominal pain) 01/10/2015  . SBO (small bowel obstruction) ([place]) 01/10/2015  . SIADH (syndrome of inappropriate ADH production) ([place]) 01/10/2015  . Cerebrovascular disease 07/14/2014  . Diabetic polyneuropathy associated with type 2 diabetes mellitus ([place]) 07/14/2014  . Hyperlipidemia 07/14/2014  . Gastric distention   .  Gastric outlet obstruction 05/21/2014  . Unstable gait 04/29/2014  . Hyponatremia 03/23/2014  . Acute encephalopathy 03/22/2014  . Slurred speech   . Chest pain   . Gastroparesis   . Slow transit constipation 02/03/2014  . Carotid stenosis 02/03/2014  . Demand ischemia ([place]) 01/21/2014  . Gastric bezoar 01/20/2014  . Anemia of chronic disease 01/18/2014  . CKD (chronic kidney disease) stage 3, GFR 30-59 ml/min 01/18/2014  . Depression 09/29/2013  . Perforated ulcer ([place]) 09/29/2013  . Essential hypertension, benign 09/22/2013  . GERD (gastroesophageal reflux disease) 09/22/2013  . Dyslipidemia 09/22/2013  . DM (diabetes mellitus) type II uncontrolled, periph vascular disorder ([place]) 09/22/2013  . A-fib ([place]) 09/22/2013  . CVA (cerebral vascular accident) ([place]) 09/22/2013  . Diabetic neuropathy ([place]) 09/22/2013  . Hypothyroidism 09/22/2013  . Chronic gastrojejunal ulcer with perforation ([place]) 09/07/2013  . Diabetes ([place]) 09/07/2013  . Accelerated hypertension 09/07/2013  . Systemic lupus erythematosus (SLE) inhibitor 09/07/2013  . Other specified abnormal immunological findings in serum 09/07/2013   s/p Procedure(s): OPEN GASTROJEJUNOSTOMY TUBE  PLACEMENT 09/23/2015 -Cr improved today! Can dc foley -G tube portion clamped, if nauseated and reattach to drainage -continue TF at goal, can have oral intake for comfort, I still think the majority of oral intake will need to be drained via the G tube portion of the tube. -Can restart oral anticoagulant, still recommend avoiding crushed meds via tube, xarelto and coumadin both have good gastric absorption (may benefit from pharmacist consult for recommendation)    LOS: 14 days   Mickeal Skinner, MD Pg# [phone] [facility]

## 2015-09-26 NOTE — Progress Notes (Signed)
PROGRESS NOTE    Shannon [name]  GMW:[id number] DOB: 02/26/1924 DOA: 09/11/2015 PCP: Kirt Boys, DO    Brief Narrative:  HPI on 09/12/2015 by Dr. Marcial Pacas Opyd Shannon [name] is a medically complex 82 y.o. female with medical history significant for hypertension, hyperlipidemia, hypothyroidism, peripheral arterial disease with ischemic toe complicated by osteomyelitis, and chronic gastric distention with recurrent nausea and vomiting who presents to the ED for evaluation of abdominal pain with nausea and vomiting of 2 days' duration. Patient had a recent admission, just discharged on 09/05/2015 following management of abdominal pain with nausea and vomiting. She had an NG tube placed at that time and was started on liquid diet, essentially improving spontaneously and tolerating full diet. She was also noted to have an ischemic third toe on the right foot secondary to PAD and workup revealed underlying osteomyelitis. Surgery was advised for management of this, however patient declined. She was discharged with a one-month course of Augmentin. She had initially done well back at home before the insidious development of recurrent abdominal pain with nausea and vomiting. She reports the vomitus as nonbloody and nonbilious. There's been no diarrhea; in fact she has not had a bowel movement in 2-3 days. She denies chest pain or palpitations, but does endorse dyspnea with minimal exertion. She denies any significant cough.  Interim history Patient found to have pyloric stenosis. Had EGD with dilatation. Patient was also treated for healthcare associated pneumonia. S/p GJ tube placement. Developed AKI which is now improving.  Complicated medication management ongoing given desire to avoid crushed medication through the GJ tube.     Assessment & Plan:   HCAP (healthcare-associated pneumonia) - Symptoms much improved, resolved - She has been on unasyn for an adequate course, consider changing to Augmentin for  gangrene - Discussed augmentin with pharmacy, can be given as solution, will need to see if the patient is having to vent the G tube regularly, if so, may need to be administered through the J portion or IV for the duration of therapy (until July 4)  Gastric outlet obstruction 2/2 pyloric stenosis, s/p GJ tube - Surgery consult noted, tolerating tube feeding - Attempt to avoid crushed medications through G tube - continue tube feed, clamp G tube and only vent for nausea, distention, etc.   AKI on CKD - Cr down trending today - D/C foley catheter - Monitor urine output with strict I/O - Monitor Cr daily - Continue tube feeding and IVF for today - Can likely stop fluids tomorrow  Right third toe gangrene - Prolonged treatment warranted, she confirmed no wish for surgery  - See discussion re: Augmentin above - WBC chronically elevated, mildly improved to 15.4 today  Dyslipidemia - Holding for now, given cannot receive through tube  Depression - On zoloft at home, currently not receiving.  Will need to monitor how often she is requiring venting of the G tube before giving to her orally.    Paroxysmal Afib with RVR on chronic anticoagulation - Currently on IV heparin, will need to discuss with patient re: starting coumadin vs. Lovenox.  Discussed with pharmacy and xarelto gastric absorption is unclear based on their review.  Coumadin may be more appropriate given that levels can be monitor and she has a relatively high CHADSVASC score of 5 - Discussed with patient and daughter.  They will think about it and prefer heparin to continue at this point.  Family will be present in hospital tomorrow for further discussion.  Essential hypertension, benign - BP ranging from the 140s-150s systolic - She is getting coreg, orally - Also with hydralazine and metoprolol IV as needed    DM (diabetes mellitus) type II uncontrolled, periph vascular disorder  - Mild, A1C 6.5 - Not currently on  medication    Hypothyroidism - continue synthroid IV  Anemia of chronic disease - Hgb baseline appears to be around 10 - 9.0 today - monitor  Hypokalemia - K is 3.1 today, receiving daily potassium supplementation - Increase to 60 per day today  Medication management - complicated given desire to not use tubes if possible, however, unclear how much she is actually absorbing by mouth - Will make a plan to initiate anticoagulation with coumadin or lovenox tomorrow pending patient wishes.  Overall discussion re: medication management should take place with patient and daughter tomorrow.  - Other changes need to be made - Amiodarone, coreg, zoloft, synthroid and BP medications - pending further discussion and more information about how much of her gastric contents are being cleared by G tube    DVT prophylaxis: Heparin gtt Code Status: Partial (No CPR, no intubation) Family Communication: None at bedside Disposition Plan: Pending improvement.    Consultants:   Pharmacy  Surgery  GI  Procedures:   EGD  Open placement of GJ tube  Antimicrobials:   Unasyn    Subjective: Reports that she is doing well.  No nausea, vomiting.  Tolerating some PO.  Urine output increasing.    Objective: Filed Vitals:   09/25/15 0547 09/25/15 1532 09/25/15 2045 09/26/15 0532  BP: 151/77 151/63 144/65 153/85  Pulse: 84 75 79 94  Temp: 98.2 F (36.8 C) 98.3 F (36.8 C) 97.8 F (36.6 C) 98.4 F (36.9 C)  TempSrc: Oral Oral Oral Oral  Resp: 18 20 18 18   Height:      Weight: 147 lb 0.8 oz (66.7 kg)   155 lb 3.3 oz (70.4 kg)  SpO2: 99%  100% 100%    Intake/Output Summary (Last 24 hours) at 09/26/15 0901 Last data filed at 09/26/15 0739  Gross per 24 hour  Intake 3570.33 ml  Output   3325 ml  Net 245.33 ml   Filed Weights   09/15/15 1412 09/25/15 0547 09/26/15 0532  Weight: 147 lb (66.679 kg) 147 lb 0.8 oz (66.7 kg) 155 lb 3.3 oz (70.4 kg)    Examination:  General exam:  Appears calm and comfortable, Moro in place Respiratory system: Clear to auscultation. Respiratory effort normal. Cardiovascular system: S1 & S2 heard, RR, NR.  Gastrointestinal system: Abdomen is nondistended, soft and nontender. G tube in place to right abdomen, draining greenish fluid into bag. Tube feeds ongoing.   Central nervous system: Alert and oriented. No focal neurological deficits. GU: Foley in place draining clear yellow urine Skin: right third toe gangrene, clean and dry.  She has some changes to the skin on the left 2nd toe, but toes are warm.      Data Reviewed:  CBC:  Recent Labs Lab 09/21/15 0451 09/22/15 0457 09/23/15 0509 09/24/15 0448 09/25/15 0446  WBC 10.5 10.6* 12.2* 17.0* 15.4*  HGB 9.6* 9.8* 10.5* 10.4* 9.0*  HCT 27.0* 27.7* 29.6* 30.0* 26.6*  MCV 94.4 93.9 94.0 96.5 95.3  PLT 333 312 327 267 214   Basic Metabolic Panel:  Recent Labs Lab 09/21/15 0448  09/22/15 0457 09/23/15 0509 09/24/15 0448 09/25/15 0446 09/25/15 0820 09/26/15 0503  NA  --   < > 138 138 140 140  --  142  K  --   < > 3.2* 3.7 3.5 3.3*  --  3.1*  CL  --   < > 103 105 108 109  --  112*  CO2  --   < > 26 26 24 23   --  24  GLUCOSE  --   < > 81 107* 95 165*  --  153*  BUN  --   < > 6 6 9 17   --  18  CREATININE  --   < > 1.06* 1.04* 2.07* 2.77*  --  2.31*  CALCIUM  --   < > 8.8* 9.0 8.5* 8.2*  --  8.4*  MG 1.6*  --   --   --   --   --  1.2*  --   < > = values in this interval not displayed.  Coagulation Profile:  Recent Labs Lab 09/22/15 0457  INR 1.17   CBG:  Recent Labs Lab 09/25/15 0542 09/25/15 0741 09/25/15 2359 09/26/15 0640 09/26/15 0745  GLUCAP 171* 182* 157* 145* 164*     Radiology Studies: US Renal  09/25/2015  CLINICAL DATA:  Acute kidney insufficiency EXAM: RENAL / URINARY TRACT ULTRASOUND COMPLETE COMPARISON:  CT scan 09/11/2015 FINDINGS: Right Kidney: Length: 10.1 cm. There is cortical increased echogenicity suspicious for medical renal disease.  No hydronephrosis. No shadowing renal calculi. No renal mass. Left Kidney: Length: 10.2 cm. Cortical increased echogenicity suspicious for medical renal disease. No hydronephrosis. No renal calculi. No renal mass. Bladder: Urinary bladder is not visualized. Small abdominal ascites. Right pleural effusion. IMPRESSION: 1. Bilateral renal mild increased echogenicity suspicious for medical renal disease. Bilateral no hydronephrosis or renal calculi. Urinary bladder is not visualized. Small abdominal ascites. Right pleural effusion. Electronically Signed   By: Natasha Mead M.D.   On: 09/25/2015 13:54     Scheduled Meds: . amiodarone  100 mg Oral Daily  . ampicillin-sulbactam (UNASYN) IV  1.5 g Intravenous Q24H  . carvedilol  3.125 mg Oral BID WC  . chlorhexidine  15 mL Mouth/Throat BID  . cycloSPORINE  1 drop Both Eyes BID  . feeding supplement (PRO-STAT SUGAR FREE 64)  30 mL Per Tube Daily  . fluticasone  2 spray Each Nare Daily  . free water  60 mL Per Tube Q6H  . gabapentin  300 mg Oral BID  . latanoprost  1 drop Both Eyes QHS  . levothyroxine  44 mcg Intravenous Daily  . metoCLOPramide (REGLAN) injection  5 mg Intravenous Q8H  . mometasone-formoterol  2 puff Inhalation BID  . nitroGLYCERIN  0.5 inch Topical Q12H  . pantoprazole  40 mg Oral Daily  . polyvinyl alcohol  1 drop Both Eyes QID  . potassium chloride  40 mEq Per Tube Daily  . sertraline  100 mg Oral Daily   Continuous Infusions: . sodium chloride 75 mL/hr at 09/26/15 0739  . feeding supplement (OSMOLITE 1.5 CAL) 1,000 mL (09/25/15 2306)  . heparin 1,050 Units/hr (09/25/15 1225)     LOS: 14 days    Time spent: 25 minutes    Debe Coder, MD [facility] Pager [phone]  If 7PM-7AM, please contact night-coverage [email or web address] Password [email or web address] 09/26/2015, 9:01 AM

## 2015-09-26 NOTE — Progress Notes (Signed)
Shannon [name] for Apixaban --> heparin Indication: Atrial Fibrillation  No Known Allergies  Patient Measurements: Height: 5\' 6"  (167.6 cm) Weight: 155 lb 3.3 oz (70.4 kg) IBW/kg (Calculated) : 59.3 Heparin Dosing Weight: 66kg  Vital Signs: Temp: 98.5 F (36.9 C) (06/25 1500) Temp Source: Oral (06/25 1500) BP: 153/85 mmHg (06/25 0532) Pulse Rate: 69 (06/25 1500)  Labs:  Recent Labs  09/24/15 0448  09/25/15 0446 09/25/15 1359 09/26/15 0503  HGB 10.4*  --  9.0*  --   --   HCT 30.0*  --  26.6*  --   --   PLT 267  --  214  --   --   HEPARINUNFRC <0.10*  < > 0.32 0.33 0.43  CREATININE 2.07*  --  2.77*  --  2.31*  < > = values in this interval not displayed.  Estimated Creatinine Clearance: 15.2 mL/min (by C-G formula based on Cr of 2.31).  Assessment: 10 yoF on chronic apixaban for hx atrial fibrillation hospitalized for GOO, transitioned prior to admission apixaban to IV heparin at admission for EGD on 6/14.  She required dilation of moderate stenosis and suction of retained fluid and small food fragments. GI ok with resuming anticoagulation 6/15.  Patient initially tolerated diet, but again having abd distention and nausea requiring NGT placement.  Pharmacy consulted to resume heparin gtt again perioperatively for GJ tube placement  Significant events 6/22 - Underwent successful open GJ tube placement; Heparin turned off at 6a 6/23: heparin resumed at 12p  PTA apixaban 2.5mg  PO BID  09/26/2015  HL remains therapeutic on 1050 units/hr  CBC: Hgb low/stable, Pltc WNL  No bleeding documented per RN  CrCl starting to improve  Goal of Therapy:  Monitor platelets by anticoagulation protocol: Yes  Heparin level 0.3-0.7   Plan:   Continue IV heparin at 1050 units/hr  Daily CBC, daily heparin level once stable  Monitor for signs of bleeding or thrombosis  F/u plans for transition to outpatient anticoag - leaning towards coumadin  or Lovenox given GJ tube and pyloric stricture.    Reuel Boom, PharmD, BCPS Pager: [phone] 09/26/2015, 4:16 PM

## 2015-09-26 NOTE — Consult Note (Signed)
WOC wound consult note Reason for Consult: Last seen by my partner (M. Austin) on 08/27/15. Patient is followed by podiarty and daughter is capable and willing care provider at home performing daily dressings. Patient with bunion deformity bilaterally (R>L) and 2nd digit is a hammer toe deformity.  The ulcer between the RGT and 2nd digit has nearly healed since last admission as has the ulcer between the 3rd and 4th digits.  The 3rd digit remains dark, and with dry peeling skin.  Patient with radiographic procedure in May that confirmed osteomyelitis in this digit, patient is without complain related to this digit. Vascular and ortho have consulted (previous admission) and recommendation made for removal (amputation) of this digit.  Patient has not consented to this procedure. Wound type: arterial insufficiency vs traumatic injury Pressure Ulcer POA: No Measurement: Discolored 3rd digit of right foot.  Healing ulcers in the intertriginous spaces.  Wound bed:Dry, healing ulcers, discolored 3rd toe on right foot. Drainage (amount, consistency, odor) None Periwound:intact, dry Dressing procedure/placement/frequency:I will continue with the dressing being used at home i.e., wrapping the third digit with a non-adherent dressing (I will use xeroform) ribbon, protecting with a single gauze 2x2 and securing lightly with tape. We will change daily and daughter to resume care following discharge.  Should patient's condition deteriorate and digit become painful or exudative, I feel confident podiatry will refer back to hospital or daughter will bring back to ED. [place] nursing team will not follow, but will remain available to this patient, the nursing and medical teams.  Please re-consult if needed. Thanks, Maudie Flakes, MSN, RN, [place], Arther Abbott  Pager# [phone]

## 2015-09-26 NOTE — Progress Notes (Signed)
Pharmacy Antibiotic Note  Shannon [name] is a 82 y.o. female admitted on 09/11/2015 with pneumonia, osteomyelitis of right 3rd toe.  Pharmacy was consulted for Vancomycin, cefepime dosing.  Antibiotics changed to ampicillin/sulbactam 6/14. Day #14 antibiotics.    Plan:  Will increase Unasyn back to 1.5 g IV q24 with CrCl improved  Transition to PO antibiotics presents a challenge as stomach contents not reliably moving into small intestine. PO liquid Augmentin seems preferable to tablets as more likely to be absorbed in stomach and pass through pyloric stricture.  Amoxicillin has data supporting jejunal administration (provided adequate dilution and sterile technique), but no data could be found regarding absorption of clavulanate by this route. Route of administration and absorption issues aside, penetration of any antibiotic into gangrenous tissue is questionable at best.  Height: 5\' 6"  (167.6 cm) Weight: 155 lb 3.3 oz (70.4 kg) IBW/kg (Calculated) : 59.3  Temp (24hrs), Avg:98.2 F (36.8 C), Min:97.8 F (36.6 C), Max:98.5 F (36.9 C)   Recent Labs Lab 09/21/15 0451 09/22/15 0457 09/23/15 0509 09/24/15 0448 09/25/15 0446 09/26/15 0503  WBC 10.5 10.6* 12.2* 17.0* 15.4*  --   CREATININE 1.19* 1.06* 1.04* 2.07* 2.77* 2.31*    Estimated Creatinine Clearance: 15.2 mL/min (by C-G formula based on Cr of 2.31).    No Known Allergies  Antimicrobials this admission: Vancomycin 6/11 >> 6/14 Cefepime 6/11 >> 6/14 Amp/sulb 6/14 >> Erythromycin PO (gastric motility) 6/14 >> 6/16  Dose adjustments this admission: (previous admission, 6/1 VT = 14 on vanc 750 q24h with SCr 1.4-1.6 range) 6/19 Increase Unasyn from 1.5 to 3g q6h 6/23: back to 1.5g q12 hr  Microbiology results: 6/11 BCx: NGF 6/11 UCx: 8K insignificant growth 6/13 MRSA PCR: neg (5/27 Cdiff neg)   Thank you for allowing pharmacy to be a part of this patient's care.  Reuel Boom, PharmD, BCPS Pager:  [phone] 09/26/2015, 4:54 PM

## 2015-09-26 NOTE — Progress Notes (Signed)
Nutrition Follow-up  DOCUMENTATION CODES:   Severe malnutrition in context of acute illness/injury  INTERVENTION:   Monitor magnesium, potassium, and phosphorus daily for at least 3 days, MD to replete as needed, as pt is at risk for refeeding syndrome given severe malnutrition, poor PO intake and significant weight loss. Mg and K are low as of 6/25.  Continue Osmolite 1.5 @ goal rate of 45 ml/hr. Continue 30 ml Prostat daily If patient not receiving IVF or unable to drink liquids, recommend free water flushes of 200 ml TID. Tube feeding regimen provides 1720 kcal (101% of needs), 83 grams of protein, and 1423 ml of H2O.   RD to follow-up 6/26 to discuss tube feeds with family.  NUTRITION DIAGNOSIS:   Inadequate oral intake related to inability to eat as evidenced by NPO status.  Ongoing.  GOAL:   Patient will meet greater than or equal to 90% of their needs  Meeting with TF.  MONITOR:   PO intake, Labs, Weight trends, TF tolerance, Skin, I & O's  REASON FOR ASSESSMENT:   Consult Enteral/tube feeding initiation and management  ASSESSMENT:   82 y.o. female with medical history significant for hypertension, hyperlipidemia, hypothyroidism, peripheral arterial disease with ischemic toe complicated by osteomyelitis, and chronic gastric distention with recurrent nausea and vomiting who presents to the ED for evaluation of abdominal pain with nausea and vomiting of 2 days' duration. Patient had a recent admission, just discharged on 09/05/2015 following management of abdominal pain with nausea and vomiting. She had an NG tube placed at that time and was started on liquid diet, essentially improving spontaneously and tolerating full diet. 6/22: s/p GJ tube placement  Patient in room with granddaughter at bedside. Pt reports tolerating feeds with no N/V or pain at this time. Pt states that sometimes after drinking some liquids she feels full. Pt states she has had some ginger ale  and yogurt.  Patient is at goal for TF. Osmolite 1.5 at 45 ml/hr, receives 30 ml Prostat daily which provides estimated needs. Free water flushes of 60 ml every 6 hours ordered, providing 240 ml daily.  Pt's granddaughter with questions regarding TF regimen at home. Family requests TF be adjusted to infuse over less time so that pt has the freedom to travel without feeding pole. RD suggested 24 hour infusion for today and will discuss with POA tomorrow. Pt's granddaughter states POA (pt's daughter) usually visits around 9-10am. Will follow-up with family regarding recommendations for night feeds, will need to verify timing (over 12 vs.16 hours?).  Will continue 24 hour feeds at this time. Will monitor for needs and tolerance.  Medications: IV Reglan every 8 hours, KCl daily Labs reviewed: CBGs: 145-164 Low K, Mg  Diet Order:  Diet full liquid Room service appropriate?: Yes; Fluid consistency:: Thin  Skin:  Wound (see comment) (Stg II toe wound)  Last BM:  6/23  Height:   Ht Readings from Last 1 Encounters:  09/15/15 5\' 6"  (1.676 m)    Weight:   Wt Readings from Last 1 Encounters:  09/26/15 155 lb 3.3 oz (70.4 kg)    Ideal Body Weight:  59.1 kg  BMI:  Body mass index is 25.06 kg/(m^2).  Estimated Nutritional Needs:   Kcal:  1500-1700  Protein:  75-85g  Fluid:  1.5-1.7L/day  EDUCATION NEEDS:   Education needs addressed  Clayton Bibles, MS, RD, LDN Pager: [phone] After Hours Pager: [phone]

## 2015-09-27 ENCOUNTER — Ambulatory Visit: Payer: Medicare Other | Admitting: Sports Medicine

## 2015-09-27 LAB — GLUCOSE, CAPILLARY
Glucose-Capillary: 123 mg/dL — ABNORMAL HIGH (ref 65–99)
Glucose-Capillary: 128 mg/dL — ABNORMAL HIGH (ref 65–99)
Glucose-Capillary: 130 mg/dL — ABNORMAL HIGH (ref 65–99)
Glucose-Capillary: 141 mg/dL — ABNORMAL HIGH (ref 65–99)
Glucose-Capillary: 149 mg/dL — ABNORMAL HIGH (ref 65–99)
Glucose-Capillary: 149 mg/dL — ABNORMAL HIGH (ref 65–99)
Glucose-Capillary: 159 mg/dL — ABNORMAL HIGH (ref 65–99)

## 2015-09-27 LAB — BASIC METABOLIC PANEL
Anion gap: 6 (ref 5–15)
BUN: 15 mg/dL (ref 6–20)
CO2: 24 mmol/L (ref 22–32)
Calcium: 8.4 mg/dL — ABNORMAL LOW (ref 8.9–10.3)
Chloride: 112 mmol/L — ABNORMAL HIGH (ref 101–111)
Creatinine, Ser: 1.67 mg/dL — ABNORMAL HIGH (ref 0.44–1.00)
GFR calc Af Amer: 30 mL/min — ABNORMAL LOW (ref >60)
GFR calc non Af Amer: 26 mL/min — ABNORMAL LOW (ref >60)
Glucose, Bld: 145 mg/dL — ABNORMAL HIGH (ref 65–99)
Potassium: 3.3 mmol/L — ABNORMAL LOW (ref 3.5–5.1)
Sodium: 142 mmol/L (ref 135–145)

## 2015-09-27 LAB — OSMOLALITY: Osmolality: 297 mOsm/kg — ABNORMAL HIGH (ref 275–295)

## 2015-09-27 LAB — HEPARIN LEVEL (UNFRACTIONATED): Heparin Unfractionated: 0.49 IU/mL (ref 0.30–0.70)

## 2015-09-27 LAB — MAGNESIUM: Magnesium: 1.5 mg/dL — ABNORMAL LOW (ref 1.7–2.4)

## 2015-09-27 MED ORDER — DICLOFENAC SODIUM 1 % TD GEL
2.0000 g | Freq: Every day | TRANSDERMAL | Status: DC | PRN
  Filled 2015-09-27: qty 100

## 2015-09-27 MED ORDER — AMOXICILLIN-POT CLAVULANATE 500-125 MG PO TABS
1.0000 | ORAL_TABLET | Freq: Two times a day (BID) | ORAL | Status: DC
  Administered 2015-09-27 – 2015-09-30 (×6): 500 mg via ORAL
  Filled 2015-09-27 (×6): qty 1

## 2015-09-27 MED ORDER — PRO-STAT SUGAR FREE PO LIQD
30.0000 mL | Freq: Two times a day (BID) | ORAL | Status: DC
  Administered 2015-09-27 – 2015-09-28 (×2): 30 mL
  Filled 2015-09-27 (×2): qty 30

## 2015-09-27 MED ORDER — APIXABAN 2.5 MG PO TABS
2.5000 mg | ORAL_TABLET | Freq: Two times a day (BID) | ORAL | Status: DC
  Administered 2015-09-27 – 2015-09-30 (×6): 2.5 mg via ORAL
  Filled 2015-09-27 (×6): qty 1

## 2015-09-27 MED ORDER — HYDRALAZINE HCL 50 MG PO TABS
50.0000 mg | ORAL_TABLET | Freq: Three times a day (TID) | ORAL | Status: DC
  Administered 2015-09-27 – 2015-09-29 (×6): 50 mg via ORAL
  Filled 2015-09-27 (×6): qty 1

## 2015-09-27 MED ORDER — MAGNESIUM SULFATE 2 GM/50ML IV SOLN
2.0000 g | Freq: Once | INTRAVENOUS | Status: AC
  Administered 2015-09-27: 2 g via INTRAVENOUS
  Filled 2015-09-27: qty 50

## 2015-09-27 MED ORDER — OSMOLITE 1.5 CAL PO LIQD
1000.0000 mL | ORAL | Status: DC
  Administered 2015-09-27: 1000 mL
  Filled 2015-09-27 (×3): qty 1000

## 2015-09-27 MED ORDER — LEVOTHYROXINE SODIUM 88 MCG PO TABS
88.0000 ug | ORAL_TABLET | Freq: Every day | ORAL | Status: DC
  Administered 2015-09-28 – 2015-09-30 (×3): 88 ug via ORAL
  Filled 2015-09-27 (×3): qty 1

## 2015-09-27 MED ORDER — SERTRALINE HCL 100 MG PO TABS
100.0000 mg | ORAL_TABLET | Freq: Every day | ORAL | Status: DC
  Administered 2015-09-28 – 2015-09-30 (×3): 100 mg via ORAL
  Filled 2015-09-27 (×3): qty 1

## 2015-09-27 NOTE — Progress Notes (Signed)
Progress Note: General Surgery Service   Subjective: Tolerating liquids, feeling better, wanting to go home  Objective: Vital signs in last 24 hours: Temp:  [97.6 F (36.4 C)-98.5 F (36.9 C)] 97.6 F (36.4 C) (06/26 0501) Pulse Rate:  [69-82] 74 (06/26 0901) Resp:  [20] 20 (06/25 2037) BP: (140-175)/(53-80) 155/69 mmHg (06/26 0901) SpO2:  [98 %-100 %] 98 % (06/26 0758) Weight:  [72.1 kg (158 lb 15.2 oz)] 72.1 kg (158 lb 15.2 oz) (06/26 0705) Last BM Date: 09/24/15  Intake/Output from previous day: 06/25 0701 - 06/26 0700 In: 4004.8 [P.O.:360; I.V.:1670.3; NG/GT:1824.5; IV Piggyback:150] Out: 1300 [Urine:600; Drains:700] Intake/Output this shift:    Lungs: CTAB  Abd: soft, NT, ND, wound c/d/i   Lab Results: CBC   Recent Labs  09/25/15 0446  WBC 15.4*  HGB 9.0*  HCT 26.6*  PLT 214   BMET  Recent Labs  09/26/15 0503 09/27/15 0449  NA 142 142  K 3.1* 3.3*  CL 112* 112*  CO2 24 24  GLUCOSE 153* 145*  BUN 18 15  CREATININE 2.31* 1.67*  CALCIUM 8.4* 8.4*   PT/INR No results for input(s): LABPROT, INR in the last 72 hours. ABG No results for input(s): PHART, HCO3 in the last 72 hours.  Invalid input(s): PCO2, PO2  Studies/Results:  Anti-infectives: Anti-infectives    Start     Dose/Rate Route Frequency Ordered Stop   09/26/15 1800  ampicillin-sulbactam (UNASYN) 1.5 g in sodium chloride 0.9 % 50 mL IVPB     1.5 g 100 mL/hr over 30 Minutes Intravenous Every 12 hours 09/26/15 1656     09/25/15 2200  ampicillin-sulbactam (UNASYN) 1.5 g in sodium chloride 0.9 % 50 mL IVPB  Status:  Discontinued     1.5 g 100 mL/hr over 30 Minutes Intravenous Every 24 hours 09/25/15 1333 09/26/15 1656   09/24/15 1800  ampicillin-sulbactam (UNASYN) 1.5 g in sodium chloride 0.9 % 50 mL IVPB  Status:  Discontinued     1.5 g 100 mL/hr over 30 Minutes Intravenous Every 12 hours 09/24/15 0742 09/25/15 1333   09/20/15 1200  Ampicillin-Sulbactam (UNASYN) 3 g in sodium  chloride 0.9 % 100 mL IVPB  Status:  Discontinued     3 g 100 mL/hr over 60 Minutes Intravenous Every 6 hours 09/20/15 0829 09/24/15 0742   09/16/15 1200  erythromycin 250 mg in sodium chloride 0.9 % 100 mL IVPB  Status:  Discontinued     250 mg 100 mL/hr over 60 Minutes Intravenous Every 6 hours 09/16/15 0846 09/17/15 1002   09/15/15 1800  ampicillin-sulbactam (UNASYN) 1.5 g in sodium chloride 0.9 % 50 mL IVPB  Status:  Discontinued     1.5 g 100 mL/hr over 30 Minutes Intravenous Every 6 hours 09/15/15 1103 09/20/15 0829   09/15/15 1715  erythromycin (E-MYCIN) tablet 250 mg  Status:  Discontinued     250 mg Oral 3 times daily before meals & bedtime 09/15/15 1656 09/16/15 0846   09/13/15 0600  vancomycin (VANCOCIN) IVPB 750 mg/150 ml premix  Status:  Discontinued     750 mg 150 mL/hr over 60 Minutes Intravenous Every 24 hours 09/12/15 0546 09/15/15 1036   09/12/15 2200  ceFEPIme (MAXIPIME) 1 g in dextrose 5 % 50 mL IVPB  Status:  Discontinued     1 g 100 mL/hr over 30 Minutes Intravenous Daily at bedtime 09/12/15 0410 09/15/15 1036   09/12/15 0430  ceFEPIme (MAXIPIME) 1 g in dextrose 5 % 50 mL IVPB  1 g 100 mL/hr over 30 Minutes Intravenous  Once 09/12/15 0427 09/12/15 0540   09/12/15 0100  vancomycin (VANCOCIN) IVPB 1000 mg/200 mL premix     1,000 mg 200 mL/hr over 60 Minutes Intravenous  Once 09/12/15 0046 09/12/15 0400   09/12/15 0100  piperacillin-tazobactam (ZOSYN) IVPB 3.375 g     3.375 g 100 mL/hr over 30 Minutes Intravenous  Once 09/12/15 0046 09/12/15 0222      Medications: Scheduled Meds: . amiodarone  100 mg Oral Daily  . ampicillin-sulbactam (UNASYN) IV  1.5 g Intravenous Q12H  . carvedilol  3.125 mg Oral BID WC  . chlorhexidine  15 mL Mouth/Throat BID  . cycloSPORINE  1 drop Both Eyes BID  . feeding supplement (PRO-STAT SUGAR FREE 64)  30 mL Per Tube Daily  . fluticasone  2 spray Each Nare Daily  . free water  60 mL Per Tube Q6H  . gabapentin  300 mg Oral BID   . latanoprost  1 drop Both Eyes QHS  . levothyroxine  44 mcg Intravenous Daily  . metoCLOPramide (REGLAN) injection  5 mg Intravenous Q8H  . mometasone-formoterol  2 puff Inhalation BID  . nitroGLYCERIN  0.5 inch Topical Q12H  . pantoprazole  40 mg Oral Daily  . polyvinyl alcohol  1 drop Both Eyes QID  . potassium chloride  60 mEq Per Tube Daily  . sertraline  100 mg Oral Daily   Continuous Infusions: . sodium chloride 75 mL/hr at 09/26/15 2044  . feeding supplement (OSMOLITE 1.5 CAL) 1,000 mL (09/26/15 2035)  . heparin 1,050 Units/hr (09/26/15 1507)   PRN Meds:.acetaminophen, albuterol, bisacodyl, hydrALAZINE, lidocaine, lip balm, metoprolol, morphine injection, ondansetron (ZOFRAN) IV, phenol  Assessment/Plan: Patient Active Problem List   Diagnosis Date Noted  . Pyloric stenosis   . Pressure ulcer 09/15/2015  . Hyperkalemia 09/12/2015  . Osteomyelitis of ankle or foot ([place])   . PVD (peripheral vascular disease) ([place])   . Diabetic osteomyelitis ([place]) 09/02/2015  . Emesis   . Adjustment disorder with anxiety 08/29/2015  . Diabetic ulcer of toe of right foot associated with type 2 diabetes mellitus, with necrosis of muscle ([place])   . Diverticulitis of large intestine without perforation or abscess without bleeding   . Colitis 08/26/2015  . Atrial fibrillation ([place]) 08/26/2015  . N&V (nausea and vomiting)   . Acute on chronic diastolic heart failure ([place]) 08/02/2015  . Esophageal candidiasis ([place])   . Goals of care, counseling/discussion   . Encounter for hospice care discussion   . Dyspnea   . SOB (shortness of breath)   . Acute renal failure superimposed on stage 3 chronic kidney disease ([place])   . Encounter for long-term (current) use of other high-risk medications   . Respiratory distress 07/06/2015  . Atrial flutter with rapid ventricular response ([place]) 07/06/2015  . Dysphagia 07/05/2015  . Asthma with allergic rhinitis 06/04/2015  . Hypertensive heart disease  03/18/2015  . S/P exploratory laparotomy 01/20/15 01/28/2015  . Malnutrition of moderate degree 01/28/2015  . Syncope 01/27/2015  . HCAP (healthcare-associated pneumonia) 01/23/2015  . Fever, unspecified 01/22/2015  . Bradycardia 01/21/2015  . Hypotension 01/21/2015  . Palliative care encounter   . DNR (do not resuscitate) discussion   . Protein-calorie malnutrition, severe 01/12/2015  . Elevated troponin 01/12/2015  . HTN (hypertension), uncontrolled 01/12/2015  . AP (abdominal pain) 01/10/2015  . SBO (small bowel obstruction) ([place]) 01/10/2015  . SIADH (syndrome of inappropriate ADH production) ([place]) 01/10/2015  . Cerebrovascular disease 07/14/2014  .  Diabetic polyneuropathy associated with type 2 diabetes mellitus ([place]) 07/14/2014  . Hyperlipidemia 07/14/2014  . Gastric distention   . Gastric outlet obstruction 05/21/2014  . Unstable gait 04/29/2014  . Hyponatremia 03/23/2014  . Acute encephalopathy 03/22/2014  . Slurred speech   . Chest pain   . Gastroparesis   . Slow transit constipation 02/03/2014  . Carotid stenosis 02/03/2014  . Demand ischemia ([place]) 01/21/2014  . Gastric bezoar 01/20/2014  . Anemia of chronic disease 01/18/2014  . CKD (chronic kidney disease) stage 3, GFR 30-59 ml/min 01/18/2014  . Depression 09/29/2013  . Perforated ulcer ([place]) 09/29/2013  . Essential hypertension, benign 09/22/2013  . GERD (gastroesophageal reflux disease) 09/22/2013  . Dyslipidemia 09/22/2013  . DM (diabetes mellitus) type II uncontrolled, periph vascular disorder ([place]) 09/22/2013  . A-fib ([place]) 09/22/2013  . CVA (cerebral vascular accident) ([place]) 09/22/2013  . Diabetic neuropathy ([place]) 09/22/2013  . Hypothyroidism 09/22/2013  . Chronic gastrojejunal ulcer with perforation ([place]) 09/07/2013  . Diabetes ([place]) 09/07/2013  . Accelerated hypertension 09/07/2013  . Systemic lupus erythematosus (SLE) inhibitor 09/07/2013  . Other specified abnormal immunological findings in serum  09/07/2013   s/p Procedure(s): OPEN GASTROJEJUNOSTOMY TUBE PLACEMENT 09/23/2015 -G tube portion clamped, if nauseated and reattach to drainage -continue TF at goal, can have oral intake for comfort, I still think the majority of oral intake will need to be drained via the G tube portion of the tube. -Ok to d/c when medically stable.  Will need tube feed education for daughter if pt is going home. -Can restart oral anticoagulant, still recommend avoiding crushed meds via tube, xarelto and coumadin both have good gastric absorption (may benefit from pharmacist consult for recommendation)    LOS: 15 days   Rosario Adie., MD

## 2015-09-27 NOTE — Progress Notes (Signed)
Patient voiding small amt of urine output. Voided 150cc this morning, bladder scan with 0 residual. Gastric tube remain clamp, patient denies any nausea and vomiting.  Tube feeding conts at 28ml per hr, tolerating well.

## 2015-09-27 NOTE — Progress Notes (Signed)
[place] for Apixaban --> heparin Indication: Atrial Fibrillation  No Known Allergies  Patient Measurements: Height: 5\' 6"  (167.6 cm) Weight: 158 lb 15.2 oz (72.1 kg) IBW/kg (Calculated) : 59.3 Heparin Dosing Weight: 66kg  Vital Signs: Temp: 97.6 F (36.4 C) (06/26 0501) Temp Source: Oral (06/26 0501) BP: 154/72 mmHg (06/26 0505) Pulse Rate: 82 (06/26 0501)  Labs:  Recent Labs  09/25/15 0446 09/25/15 1359 09/26/15 0503 09/27/15 0449  HGB 9.0*  --   --   --   HCT 26.6*  --   --   --   PLT 214  --   --   --   HEPARINUNFRC 0.32 0.33 0.43 0.49  CREATININE 2.77*  --  2.31* 1.67*    Estimated Creatinine Clearance: 22.8 mL/min (by C-G formula based on Cr of 1.67).  Assessment: 20 yoF on chronic apixaban for hx atrial fibrillation hospitalized for GOO, transitioned prior to admission apixaban to IV heparin at admission for EGD on 6/14.  She required dilation of moderate stenosis and suction of retained fluid and small food fragments. GI ok with resuming anticoagulation 6/15.  Patient initially tolerated diet, but again having abd distention and nausea requiring NGT placement.  Pharmacy consulted to resume heparin gtt again perioperatively for GJ tube placement  Significant events 6/22 - Underwent successful open GJ tube placement; Heparin turned off at 6a 6/23: heparin resumed at 12p  PTA apixaban 2.5mg  PO BID  09/27/2015  HL remains therapeutic on 1050 units/hr  CBC: Hgb low/stable, Pltc WNL  No documented bleeding  CrCl improving  Goal of Therapy:  Monitor platelets by anticoagulation protocol: Yes  Heparin level 0.3-0.7   Plan:   Continue IV heparin at 1050 units/hr  Daily CBC, daily heparin level   Monitor for signs of bleeding or thrombosis  F/u plans for transition to outpatient anticoag - leaning towards coumadin or Lovenox given GJ tube and pyloric stricture.   Doreene Eland, PharmD, BCPS.   Pager:  [phone] 09/27/2015 7:31 AM

## 2015-09-27 NOTE — Plan of Care (Signed)
Problem: ICU Phase Progression Outcomes Goal: O2 sats trending toward baseline Outcome: Completed/Met Date Met:  09/27/15 Weaned off of O2.  sats >95.    Problem: Bowel/Gastric: Goal: Gastrointestinal status for postoperative course will improve Pt with audible bowel sounds.  Tolerated continuous tube feeding at 68m/hr.  Now tube feeding discontinued during day and will start from 9pm to 9am to see if pt can tolerate at a higher rate and less duration.  Pt complains of feeling full, but tolerates full liquid diet, no complaints of nausea, gastric drainage tube clamped off.  Able to tolerate PO meds as well.    Problem: Pain Management: Goal: General experience of comfort will improve Outcome: Progressing Only complains of tenderness to abdomen.    Problem: Skin Integrity: Goal: Demonstration of wound healing without infection will improve Continue.  3rd toe on right foot remains necrotic and numb, great toe slightly numb and purplish.  Dressed with vaseline gauze, 2x2 gauze and tape.  Dressing slides between toes 1-3.

## 2015-09-27 NOTE — Progress Notes (Signed)
Nutrition Follow-up  DOCUMENTATION CODES:   Severe malnutrition in context of acute illness/injury  INTERVENTION:   Stop Tube feeds at 1200. Will reinitiate to trial night feeds per family and pt request. Initiate Osmolite 1.5 @ 45 ml/hr at 2100. Advance by 10 ml every 4 hours until goal rate of 65 ml/hr is reached.  Provide 30 ml Prostat BID RD to monitor for tolerance. Will follow-up 6/27.  Goal: Osmolite 1.5 @ 65 ml/hr over 12 hours (2100-0900) 30 ml Prostat TID  Free water flushes 240 ml BID Tube feeding regimen provides 1470 kcal (98% of needs), 94g protein and 1074 ml H2O.  Patient to meet additional fluid needs PO.  NUTRITION DIAGNOSIS:   Inadequate oral intake related to inability to eat as evidenced by NPO status.  Now related to altered GI function as evidenced by meal completion <25%.  GOAL:   Patient will meet greater than or equal to 90% of their needs  Meeting.  MONITOR:   PO intake, Labs, Weight trends, TF tolerance, Skin, I & O's  ASSESSMENT:   82 y.o. female with medical history significant for hypertension, hyperlipidemia, hypothyroidism, peripheral arterial disease with ischemic toe complicated by osteomyelitis, and chronic gastric distention with recurrent nausea and vomiting who presents to the ED for evaluation of abdominal pain with nausea and vomiting of 2 days' duration. Patient had a recent admission, just discharged on 09/05/2015 following management of abdominal pain with nausea and vomiting. She had an NG tube placed at that time and was started on liquid diet, essentially improving spontaneously and tolerating full diet.  Spoke with pt this morning, who had eaten a few bites of grits, all of her juice and most of a ice cream cup for breakfast. Pt's daughter expected to arrive this morning. Spoke with pt's daughter at bedside when she arrived and provided Home Tube Feeding booklet and provided goal rates for tube feeding infusion at home. Pt's  family had requested trial of night feeds so that pt may have time off of tube feeding pump for daily activities. Reviewed multiple options with pt's daughter and she felt that having tube feeds infuse over 12 hours at night was the best option. RD explained that this would increase the goal rate of the tube feeding, daughter understood. RN was in room, RD explained that we will stop tube feeding infusion around 1200 and will reinitiate tonight around 2100.  Will advance slowly to goal rate of 65 ml/hr overnight, starting at 45 ml/hr and advancing by 10 every 4 hours.  RD to monitor for tolerance and will follow-up tomorrow 6/27.  Pt's daughter states that pt likes Boost Breeze supplements, RD to provide coupons at follow-up.  Medications: Mg sulfate IV once, IV Reglan every 8 hours, KCl daily Labs reviewed: CBGs: 130-149 Low K, Mg (trending up)  Diet Order:  Diet full liquid Room service appropriate?: Yes; Fluid consistency:: Thin  Skin:  Wound (see comment) (Stg II toe wound)  Last BM:  6/23  Height:   Ht Readings from Last 1 Encounters:  09/15/15 5\' 6"  (1.676 m)    Weight:   Wt Readings from Last 1 Encounters:  09/27/15 158 lb 15.2 oz (72.1 kg)    Ideal Body Weight:  59.1 kg  BMI:  Body mass index is 25.67 kg/(m^2).  Estimated Nutritional Needs:   Kcal:  1500-1700  Protein:  75-85g  Fluid:  1.5-1.7L/day  EDUCATION NEEDS:   Education needs addressed  Clayton Bibles, MS, RD, LDN Pager: [phone] After  Hours Pager: [phone]

## 2015-09-27 NOTE — Progress Notes (Signed)
Physical Therapy Treatment Patient Details Name: Shannon [name] MRN: [id number] DOB: 1923-08-08 Today's Date: 09/27/2015    History of Present Illness 82 yo female admitted with HCAP and Gastic outlet obstruction 2* pyloric stenosis. Hx of HTN, Osteomyelitis of 3rd toe on R foot,  Afib, MI, ventral hernia, CVA, DM, COPD, arhtritis, PVD, neuropathy.    PT Comments    Progressing with mobility. Pt was able to ambulate a good distance on today. She tolerated session well. Will continue to follow and progress activity as tolerated.   Follow Up Recommendations  Home health PT;Supervision/Assistance - 24 hour     Equipment Recommendations  None recommended by PT    Recommendations for Other Services       Precautions / Restrictions Precautions Precautions: Fall Precaution Comments: G tube, feeding tube Restrictions Weight Bearing Restrictions: No    Mobility  Bed Mobility Overal bed mobility: Needs Assistance Bed Mobility: Supine to Sit     Supine to sit: Min assist;HOB elevated     General bed mobility comments: small amount of assist to get to EOB. Increased time. Heavy reliance on bedrail.   Transfers Overall transfer level: Needs assistance Equipment used: Rolling walker (2 wheeled) Transfers: Sit to/from Stand Sit to Stand: Min assist Stand pivot transfers: Min assist       General transfer comment: Assist to rise, stabilize, control descent. Multimodal cues for safety, technique, hand placement. Stand pivot, bed to recliner, with RW  Ambulation/Gait Ambulation/Gait assistance: Min assist Ambulation Distance (Feet): 100 Feet Assistive device: Rolling walker (2 wheeled) Gait Pattern/deviations: Step-through pattern;Decreased stride length     General Gait Details: Assist to stabilize. slow gait speed. Pt tolerated distance well.    Stairs            Wheelchair Mobility    Modified Rankin (Stroke Patients Only)       Balance Overall balance  assessment: Needs assistance         Standing balance support: During functional activity Standing balance-Leahy Scale: Poor                      Cognition Arousal/Alertness: Awake/alert Behavior During Therapy: WFL for tasks assessed/performed Overall Cognitive Status: Within Functional Limits for tasks assessed                      Exercises      General Comments        Pertinent Vitals/Pain Pain Assessment: Faces Faces Pain Scale: Hurts little more Pain Location: abdomen Pain Descriptors / Indicators: Sore Pain Intervention(s): Monitored during session;Repositioned    Home Living                      Prior Function            PT Goals (current goals can now be found in the care plan section) Progress towards PT goals: Progressing toward goals    Frequency  Min 3X/week    PT Plan Current plan remains appropriate    Co-evaluation             End of Session   Activity Tolerance: Patient tolerated treatment well Patient left: in chair;with call bell/phone within reach;with chair alarm set     Time: 1000-1027 PT Time Calculation (min) (ACUTE ONLY): 27 min  Charges:  $Gait Training: 8-22 mins $Therapeutic Activity: 8-22 mins  G Codes:      Rebeca Alert, MPT Pager: [phone]

## 2015-09-27 NOTE — Progress Notes (Signed)
PROGRESS NOTE    Shannon [name]  VQQ:[id number] DOB: [birth year] DOA: 09/11/2015 PCP: Shannon Boys, DO    Brief Narrative:  HPI on 09/12/2015 by Shannon [name] [name] is a medically complex 82 y.o. female with medical history significant for hypertension, hyperlipidemia, hypothyroidism, peripheral arterial disease with ischemic toe complicated by osteomyelitis, and chronic gastric distention with recurrent nausea and vomiting who presents to the ED for evaluation of abdominal pain with nausea and vomiting of 2 days' duration. Patient had a recent admission, just discharged on 09/05/2015 following management of abdominal pain with nausea and vomiting. She had an NG tube placed at that time and was started on liquid diet, essentially improving spontaneously and tolerating full diet. She was also noted to have an ischemic third toe on the right foot secondary to PAD and workup revealed underlying osteomyelitis. Surgery was advised for management of this, however patient declined. She was discharged with a one-month course of Augmentin. She had initially done well back at home before the insidious development of recurrent abdominal pain with nausea and vomiting. She reports the vomitus as nonbloody and nonbilious. There's been no diarrhea; in fact she has not had a bowel movement in 2-3 days. She denies chest pain or palpitations, but does endorse dyspnea with minimal exertion. She denies any significant cough.  Interim history Patient found to have pyloric stenosis. Had EGD with dilatation. Patient was also treated for healthcare associated pneumonia. S/p GJ tube placement. Developed AKI which is now improving.  Complicated medication management ongoing given desire to avoid crushed medication through the GJ tube.     Assessment & Plan:   HCAP (healthcare-associated pneumonia) - Symptoms much improved, resolved - She has been on unasyn for an adequate course, change to Augmentin today for  gangrene  Gastric outlet obstruction 2/2 pyloric stenosis, s/p GJ tube - Surgery consult noted, tolerating tube feeding - Per nutrition note, will start feeding for 12 hours overnight.  - Attempt to avoid crushed medications through G tube, no meds through J tube - Clamped for 24 hours and doing well.   AKI on CKD - Cr further improved today - Monitor urine output with strict I/O - Monitor Cr daily - Continue tube feeding, d/c IVF today  Right third toe gangrene - Prolonged treatment warranted, she confirmed no wish for surgery  - Augmentin started back today - end date is around 7/4 per note review.  - WBC chronically elevated, improved to 15.4 yesterday  Dyslipidemia - Holding for now, given age, discussed with patient and family and they have opted to stop statin  Depression - On zoloft at home and receiving by mouth.  Continue  Paroxysmal Afib with RVR on chronic anticoagulation -  Discussed with patient and daughter in room.  Given good operation of J tube and no need to un clamp G portion overnight, will start back on Eliquis.  Discussed at length with pharmacy today and yesterday and I do not think that coumadin or lovenox would be a good option for her.  Family still desires her to be anticoagulated to help reduce risk of stroke.   - Restart Eliquis at home dose    Essential hypertension, benign - BP ranging from the 140s-150s systolic - Coreg, add home hydralazine at 50mg  q8 hours    DM (diabetes mellitus) type II uncontrolled, periph vascular disorder  - Mild, A1C 6.5 - Not currently on medication    Hypothyroidism - Change to oral synthroid, home dose  Anemia of  chronic disease - Hgb baseline appears to be around 10 - 9.0 yesterday - monitor  Hypokalemia - K is 3.3 today, receiving daily potassium supplementation - 60 meq per day   Medication management - Patient doing very well with tube feeds and G tube clamped - Will restart home medications as noted  above and switch other medications to PO formulations   DVT prophylaxis: Eliquis Code Status: Partial (No CPR, no intubation) Family Communication: Daughter Shannon [name] at bedside Disposition Plan:  Discharge in 1-2 days   Consultants:   Pharmacy  Surgery  GI  Nutrition  Procedures:   EGD  Open placement of GJ tube  Antimicrobials:   Unasyn stopped on 6/26  Augmentin   Subjective: Reports that she is doing well.  No nausea, vomiting.  Tolerating some PO.  Does not wish to be on statin  Objective: Filed Vitals:   09/26/15 2037 09/27/15 0501 09/27/15 0505 09/27/15 0705  BP: 140/53 175/80 154/72   Pulse: 73 82    Temp: 98.3 F (36.8 C) 97.6 F (36.4 C)    TempSrc: Oral Oral    Resp: 20     Height:      Weight:    158 lb 15.2 oz (72.1 kg)  SpO2: 100% 100%      Intake/Output Summary (Last 24 hours) at 09/27/15 0730 Last data filed at 09/27/15 0657  Gross per 24 hour  Intake 4004.8 ml  Output   1300 ml  Net 2704.8 ml   Filed Weights   09/25/15 0547 09/26/15 0532 09/27/15 0705  Weight: 147 lb 0.8 oz (66.7 kg) 155 lb 3.3 oz (70.4 kg) 158 lb 15.2 oz (72.1 kg)    Examination:  General exam: Appears calm and comfortable, sitting up in chair HENT: MMM, NCAT Respiratory system: Clear to auscultation. Respiratory effort normal. Cardiovascular system: S1 & S2 heard, RR, NR.  Gastrointestinal system: Abdomen is nondistended, soft and tender around site of tube insertion. G tube in place to right abdomen, some whitish drainage on bandage. Tube feeds ongoing.   Central nervous system: Alert and oriented. No focal neurological deficits. Skin: right third toe gangrene, clean and dry.    Data Reviewed:  CBC:  Recent Labs Lab 09/21/15 0451 09/22/15 0457 09/23/15 0509 09/24/15 0448 09/25/15 0446  WBC 10.5 10.6* 12.2* 17.0* 15.4*  HGB 9.6* 9.8* 10.5* 10.4* 9.0*  HCT 27.0* 27.7* 29.6* 30.0* 26.6*  MCV 94.4 93.9 94.0 96.5 95.3  PLT 333 312 327 267 214   Basic  Metabolic Panel:  Recent Labs Lab 09/21/15 0448  09/23/15 0509 09/24/15 0448 09/25/15 0446 09/25/15 0820 09/26/15 0503 09/27/15 0449  NA  --   < > 138 140 140  --  142 142  K  --   < > 3.7 3.5 3.3*  --  3.1* 3.3*  CL  --   < > 105 108 109  --  112* 112*  CO2  --   < > 26 24 23   --  24 24  GLUCOSE  --   < > 107* 95 165*  --  153* 145*  BUN  --   < > 6 9 17   --  18 15  CREATININE  --   < > 1.04* 2.07* 2.77*  --  2.31* 1.67*  CALCIUM  --   < > 9.0 8.5* 8.2*  --  8.4* 8.4*  MG 1.6*  --   --   --   --  1.2*  --  1.5*  < > =  values in this interval not displayed.  Coagulation Profile:  Recent Labs Lab 09/22/15 0457  INR 1.17   CBG:  Recent Labs Lab 09/26/15 1140 09/26/15 1655 09/26/15 2036 09/27/15 0044 09/27/15 0459  GLUCAP 155* 139* 160* 123* 130*     Radiology Studies: US Renal  09/25/2015  CLINICAL DATA:  Acute kidney insufficiency EXAM: RENAL / URINARY TRACT ULTRASOUND COMPLETE COMPARISON:  CT scan 09/11/2015 FINDINGS: Right Kidney: Length: 10.1 cm. There is cortical increased echogenicity suspicious for medical renal disease. No hydronephrosis. No shadowing renal calculi. No renal mass. Left Kidney: Length: 10.2 cm. Cortical increased echogenicity suspicious for medical renal disease. No hydronephrosis. No renal calculi. No renal mass. Bladder: Urinary bladder is not visualized. Small abdominal ascites. Right pleural effusion. IMPRESSION: 1. Bilateral renal mild increased echogenicity suspicious for medical renal disease. Bilateral no hydronephrosis or renal calculi. Urinary bladder is not visualized. Small abdominal ascites. Right pleural effusion. Electronically Signed   By: Natasha Mead M.D.   On: 09/25/2015 13:54     Scheduled Meds: . amiodarone  100 mg Oral Daily  . ampicillin-sulbactam (UNASYN) IV  1.5 g Intravenous Q12H  . carvedilol  3.125 mg Oral BID WC  . chlorhexidine  15 mL Mouth/Throat BID  . cycloSPORINE  1 drop Both Eyes BID  . feeding supplement  (PRO-STAT SUGAR FREE 64)  30 mL Per Tube Daily  . fluticasone  2 spray Each Nare Daily  . free water  60 mL Per Tube Q6H  . gabapentin  300 mg Oral BID  . latanoprost  1 drop Both Eyes QHS  . levothyroxine  44 mcg Intravenous Daily  . metoCLOPramide (REGLAN) injection  5 mg Intravenous Q8H  . mometasone-formoterol  2 puff Inhalation BID  . nitroGLYCERIN  0.5 inch Topical Q12H  . pantoprazole  40 mg Oral Daily  . polyvinyl alcohol  1 drop Both Eyes QID  . potassium chloride  60 mEq Per Tube Daily  . sertraline  100 mg Oral Daily   Continuous Infusions: . sodium chloride 75 mL/hr at 09/26/15 2044  . feeding supplement (OSMOLITE 1.5 CAL) 1,000 mL (09/26/15 2035)  . heparin 1,050 Units/hr (09/26/15 1507)     LOS: 15 days    Time spent: 25 minutes    Debe Coder, MD [facility] Pager [phone]  If 7PM-7AM, please contact night-coverage [email or web address] Password [email or web address] 09/27/2015, 7:30 AM

## 2015-09-28 LAB — BASIC METABOLIC PANEL
Anion gap: 5 (ref 5–15)
BUN: 18 mg/dL (ref 6–20)
CO2: 25 mmol/L (ref 22–32)
Calcium: 9 mg/dL (ref 8.9–10.3)
Chloride: 107 mmol/L (ref 101–111)
Creatinine, Ser: 1.32 mg/dL — ABNORMAL HIGH (ref 0.44–1.00)
GFR calc Af Amer: 40 mL/min — ABNORMAL LOW (ref >60)
GFR calc non Af Amer: 34 mL/min — ABNORMAL LOW (ref >60)
Glucose, Bld: 147 mg/dL — ABNORMAL HIGH (ref 65–99)
Potassium: 4 mmol/L (ref 3.5–5.1)
Sodium: 137 mmol/L (ref 135–145)

## 2015-09-28 LAB — GLUCOSE, CAPILLARY
Glucose-Capillary: 105 mg/dL — ABNORMAL HIGH (ref 65–99)
Glucose-Capillary: 106 mg/dL — ABNORMAL HIGH (ref 65–99)
Glucose-Capillary: 122 mg/dL — ABNORMAL HIGH (ref 65–99)
Glucose-Capillary: 139 mg/dL — ABNORMAL HIGH (ref 65–99)
Glucose-Capillary: 157 mg/dL — ABNORMAL HIGH (ref 65–99)
Glucose-Capillary: 93 mg/dL (ref 65–99)

## 2015-09-28 LAB — HEPARIN LEVEL (UNFRACTIONATED): Heparin Unfractionated: 0.18 IU/mL — ABNORMAL LOW (ref 0.30–0.70)

## 2015-09-28 LAB — MAGNESIUM: Magnesium: 1.8 mg/dL (ref 1.7–2.4)

## 2015-09-28 MED ORDER — ISOSORBIDE MONONITRATE ER 30 MG PO TB24
30.0000 mg | ORAL_TABLET | Freq: Every day | ORAL | Status: DC
  Administered 2015-09-28 – 2015-09-30 (×3): 30 mg via ORAL
  Filled 2015-09-28 (×3): qty 1

## 2015-09-28 MED ORDER — PRO-STAT SUGAR FREE PO LIQD
30.0000 mL | Freq: Three times a day (TID) | ORAL | Status: DC
  Administered 2015-09-28 – 2015-09-29 (×3): 30 mL
  Filled 2015-09-28 (×3): qty 30

## 2015-09-28 MED ORDER — OSMOLITE 1.5 CAL PO LIQD
1000.0000 mL | ORAL | Status: DC
  Administered 2015-09-28: 1000 mL
  Filled 2015-09-28 (×2): qty 1000

## 2015-09-28 MED ORDER — AMLODIPINE BESYLATE 10 MG PO TABS
10.0000 mg | ORAL_TABLET | Freq: Every day | ORAL | Status: DC
  Administered 2015-09-28 – 2015-09-30 (×3): 10 mg via ORAL
  Filled 2015-09-28 (×3): qty 1

## 2015-09-28 NOTE — Progress Notes (Signed)
[facility] PROGRESS NOTE    Progress Note  Shannon [name]  WUJ:[id number] DOB: 1923-07-29 DOA: 09/11/2015 PCP: Kirt Boys, DO     Brief Narrative:   Shannon [name] is an 82 y.o. female with medical history significant for hypertension, hyperlipidemia, hypothyroidism, peripheral arterial disease with ischemic toe complicated by osteomyelitis, and chronic gastric distention with recurrent nausea and vomiting who presents to the ED for evaluation of abdominal pain with nausea and vomiting of 2 days' duration. Patient had a recent admission, just discharged on 09/05/2015 following management of abdominal pain with nausea and vomiting. She had an NG tube placed at that time and was started on liquid diet, essentially improving spontaneously and tolerating full diet. She was also noted to have an ischemic third toe on the right foot secondary to PAD and workup revealed underlying osteomyelitis. Surgery was advised for management of this, however patient declined. She was discharged with a one-month course of Augmentin. She had initially done well back at home before the insidious development of recurrent abdominal pain with nausea and vomiting. She reports the vomitus as nonbloody and nonbilious. There's been no diarrhea; in fact she has not had a bowel movement in 2-3 days. She denies chest pain or palpitations, but does endorse dyspnea with minimal exertion. She denies any significant cough  Assessment/Plan:   Aspiration pneumonia: Asymptomatic, resolved. Continue Augmentin.  Gastric outlet obstruction secondary to pylorostenosis status post J-tube placement: Initially improve after EGD and dilation but returned to baseline, on 09/18/2015 patient had reinsertion of NG tube due to abdominal pain and distention. GI was consulted and recommended postpyloric feeding tube placement, surgery was consulted and she is status post open J-tube placement by general surgery. Nutrition was  consulted. Surgery consulted tolerating tube feedings, she was started on nutrition through tube started on on 09/27/2015, *Casimiro Needle continues titrate up.  Acute on chronic kidney disease stage III: Likely due to contrast versus medication. Creatinine peaked 2.7. Iv fluids was DC'd, baseline creatinine 1.1-1.3, now improving at 1.6.  Right third toe gangrene: She confirmed her wishes for surgery, prolonged treatment warranted. She was started back on Augmentin today, and date on 10/05/2015. Expected white blood cell count to be chronically elevated Has remained afebrile.  Paroxysmal atrial fibrillation with RVR on oral anti-correlation: Started on Eluquis which she tolerated.  Essential hypertension: Blood pressure not at goal restart Imdur and Norvasc. Case manager to evaluate the price of Imdur.  DM (diabetes mellitus) type II uncontrolled, periph vascular disorder (HCC) A1c 6.5, currently on no medication continue diet control  Hypothyroidism: Continue Synthroid.  Anemia of chronic disease: Seems to be baseline.  Medication management: Started on home medications.   Severe caloric malnutrition: Continue J-tube feeding.  DVT prophylaxis: Eliquis Family Communication:none Disposition Plan/Barrier to D/C: Feeding not at goal, will go home with home health PT Code Status:     Code Status Orders        Start     Ordered   09/12/15 0408  Limited resuscitation (code)   Continuous    Question Answer Comment  In the event of cardiac or respiratory ARREST: Initiate Code Blue, Call Rapid Response Yes   In the event of cardiac or respiratory ARREST: Perform CPR No   In the event of cardiac or respiratory ARREST: Perform Intubation/Mechanical Ventilation No   In the event of cardiac or respiratory ARREST: Use NIPPV/BiPAp only if indicated Yes   In the event of cardiac or respiratory ARREST: Administer ACLS medications if indicated Yes  In the event of cardiac or respiratory  ARREST: Perform Defibrillation or Cardioversion if indicated Yes      09/12/15 0410    Code Status History    Date Active Date Inactive Code Status Order ID Comments User Context   08/26/2015  7:18 PM 09/05/2015  6:24 PM Partial Code [id number]  Meredith Pel, NP Inpatient   08/26/2015  3:27 PM 08/26/2015  7:18 PM Full Code [id number]  Meredith Pel, NP ED   08/02/2015  4:03 PM 08/05/2015  5:31 PM Partial Code [id number]  Abelino Derrick, PA-C ED   07/06/2015 11:44 AM 07/11/2015  4:36 PM Partial Code [id number]  Vilinda Blanks Minor, NP Inpatient   07/05/2015  9:18 PM 07/06/2015 11:44 AM Full Code [id number]  Hillary Bow, DO ED   01/28/2015 12:47 AM 01/29/2015  5:32 PM DNR [id number]  Rolly Salter, MD Inpatient   01/14/2015  2:09 PM 01/27/2015  9:48 PM DNR [id number]  Katheran Awe, NP Inpatient   01/10/2015  5:01 AM 01/14/2015  2:09 PM Full Code [id number]  Hillary Bow, DO ED   05/21/2014 11:24 PM 05/30/2014  6:29 PM DNR [id number]  Hillary Bow, DO ED   03/27/2014  5:33 PM 03/30/2014  6:26 PM DNR [id number]  Huey Bienenstock, MD Inpatient   03/13/2014  2:26 AM 03/26/2014  4:31 PM Full Code [id number]  Eduard Clos, MD Inpatient   01/18/2014  1:50 AM 01/22/2014  5:08 PM Full Code [id number]  Pearson Grippe, MD Inpatient   12/23/2013 11:11 PM 12/26/2013 10:18 PM Full Code [id number]  Haydee Monica, MD Inpatient    Advance Directive Documentation        Most Recent Value   Type of Advance Directive  Healthcare Power of Attorney   Pre-existing out of facility DNR order (yellow form or pink MOST form)     "MOST" Form in Place?          IV Access:    Peripheral IV   Procedures and diagnostic studies:   No results found.   Medical Consultants:    None.  Anti-Infectives:   Augmentin  Subjective:    Shannon [name] she relates she is tolerating her diet. No new complaints.  Objective:    Filed Vitals:   09/27/15 2244 09/28/15 0500 09/28/15 0507 09/28/15 0940  BP: 158/73  171/89 188/94   Pulse:   101 97  Temp:   98 F (36.7 C)   TempSrc:      Resp:   18   Height:      Weight:  70.1 kg (154 lb 8.7 oz)    SpO2:   100%     Intake/Output Summary (Last 24 hours) at 09/28/15 1010 Last data filed at 09/28/15 0700  Gross per 24 hour  Intake   2927 ml  Output   1075 ml  Net   1852 ml   Filed Weights   09/26/15 0532 09/27/15 0705 09/28/15 0500  Weight: 70.4 kg (155 lb 3.3 oz) 72.1 kg (158 lb 15.2 oz) 70.1 kg (154 lb 8.7 oz)    Exam: General exam: In no acute distress.Cachectic Respiratory system: Good air movement and clear to auscultation. Cardiovascular system: S1 & S2 heard, RRR. Gastrointestinal system: Abdomen is nondistended, soft and nontender.  Central nervous system: Alert and oriented. No focal neurological deficits. Extremities: Right third toe dry gangrene Psychiatry: Judgement and insight appear normal. Mood & affect appropriate.    Data Reviewed:    Labs:  Basic Metabolic Panel:  Recent Labs Lab 09/24/15 0448 09/25/15 0446 09/25/15 0820 09/26/15 0503 09/27/15 0449 09/28/15 0507  NA 140 140  --  142 142 137  K 3.5 3.3*  --  3.1* 3.3* 4.0  CL 108 109  --  112* 112* 107  CO2 24 23  --  24 24 25   GLUCOSE 95 165*  --  153* 145* 147*  BUN 9 17  --  18 15 18   CREATININE 2.07* 2.77*  --  2.31* 1.67* 1.32*  CALCIUM 8.5* 8.2*  --  8.4* 8.4* 9.0  MG  --   --  1.2*  --  1.5* 1.8   GFR Estimated Creatinine Clearance: 26.5 mL/min (by C-G formula based on Cr of 1.32). Liver Function Tests: No results for input(s): AST, ALT, ALKPHOS, BILITOT, PROT, ALBUMIN in the last 168 hours. No results for input(s): LIPASE, AMYLASE in the last 168 hours. No results for input(s): AMMONIA in the last 168 hours. Coagulation profile  Recent Labs Lab 09/22/15 0457  INR 1.17    CBC:  Recent Labs Lab 09/22/15 0457 09/23/15 0509 09/24/15 0448 09/25/15 0446  WBC 10.6* 12.2* 17.0* 15.4*  HGB 9.8* 10.5* 10.4* 9.0*  HCT 27.7* 29.6* 30.0* 26.6*  MCV 93.9  94.0 96.5 95.3  PLT 312 327 267 214   Cardiac Enzymes: No results for input(s): CKTOTAL, CKMB, CKMBINDEX, TROPONINI in the last 168 hours. BNP (last 3 results) No results for input(s): PROBNP in the last 8760 hours. CBG:  Recent Labs Lab 09/27/15 1715 09/27/15 2032 09/27/15 2340 09/28/15 0411 09/28/15 0726  GLUCAP 141* 128* 149* 139* 157*   D-Dimer: No results for input(s): DDIMER in the last 72 hours. Hgb A1c: No results for input(s): HGBA1C in the last 72 hours. Lipid Profile: No results for input(s): CHOL, HDL, LDLCALC, TRIG, CHOLHDL, LDLDIRECT in the last 72 hours. Thyroid function studies: No results for input(s): TSH, T4TOTAL, T3FREE, THYROIDAB in the last 72 hours.  Invalid input(s): FREET3 Anemia work up: No results for input(s): VITAMINB12, FOLATE, FERRITIN, TIBC, IRON, RETICCTPCT in the last 72 hours. Sepsis Labs:  Recent Labs Lab 09/22/15 0457 09/23/15 0509 09/24/15 0448 09/25/15 0446  WBC 10.6* 12.2* 17.0* 15.4*   Microbiology No results found for this or any previous visit (from the past 240 hour(s)).   Medications:   . amiodarone  100 mg Oral Daily  . amoxicillin-clavulanate  1 tablet Oral BID  . apixaban  2.5 mg Oral BID  . carvedilol  3.125 mg Oral BID WC  . chlorhexidine  15 mL Mouth/Throat BID  . cycloSPORINE  1 drop Both Eyes BID  . feeding supplement (PRO-STAT SUGAR FREE 64)  30 mL Per Tube BID  . fluticasone  2 spray Each Nare Daily  . free water  60 mL Per Tube Q6H  . gabapentin  300 mg Oral BID  . hydrALAZINE  50 mg Oral Q8H  . latanoprost  1 drop Both Eyes QHS  . levothyroxine  88 mcg Oral QAC breakfast  . metoCLOPramide (REGLAN) injection  5 mg Intravenous Q8H  . mometasone-formoterol  2 puff Inhalation BID  . nitroGLYCERIN  0.5 inch Topical Q12H  . pantoprazole  40 mg Oral Daily  . polyvinyl alcohol  1 drop Both Eyes QID  . potassium chloride  60 mEq Per Tube Daily  . sertraline  100 mg Oral Daily   Continuous Infusions: .  feeding supplement (OSMOLITE 1.5 CAL) 1,000 mL (09/27/15 2102)    Time spent: 15  min  LOS: 16 days   Marinda Elk  [facility] Pager [phone]  *Please refer to [email or web address], password [email or web address] to get updated schedule on who will round on this patient, as hospitalists switch teams weekly. If 7PM-7AM, please contact night-coverage at [email or web address], password [email or web address] for any overnight needs.  09/28/2015, 10:10 AM

## 2015-09-28 NOTE — Plan of Care (Signed)
Problem: Skin Integrity: Goal: Demonstration of wound healing without infection will improve Continue.  MASD to buttocks.  Using barrier cream.

## 2015-09-28 NOTE — Progress Notes (Signed)
5 Days Post-Op  Subjective: The entire tube is clamped and she is trying to eat some clears.  The tube feeding is currently off and they are trying to switch her over to PM feeds.  I talked with the nurse and re-explained the reason for the [name] tube.  Nothing down the feeding jejunostomy portion except liquids.  Flush frequently, even when not in use.  All pills PO or thru the gastrostomy site.  Intermittent clamping of  her gastrostomy for PO's is fine, but I told them to keep it open to drainage most of the time.    Objective: Vital signs in last 24 hours: Temp:  [98 F (36.7 C)-99.1 F (37.3 C)] 98 F (36.7 C) (06/27 0507) Pulse Rate:  [71-101] 97 (06/27 0940) Resp:  [18-20] 18 (06/27 0507) BP: (158-193)/(73-94) 188/94 mmHg (06/27 0940) SpO2:  [96 %-100 %] 100 % (06/27 0507) Weight:  [70.1 kg (154 lb 8.7 oz)] 70.1 kg (154 lb 8.7 oz) (06/27 0500) Last BM Date: 09/27/15 PO 840 Feeding tube:  1590 Urine 1275 BM x 1 recorded Afebrile, VSS Creatinine 1.32  Intake/Output from previous day: 06/26 0701 - 06/27 0700 In: 3167 [P.O.:840; I.V.:566.3; NG/GT:1590.8; IV Piggyback:50] Out: [id number] [Urine:1275] Intake/Output this shift:    General appearance: alert, cooperative and no distress Resp: bilateral ronchi, also just sounds congested some, she says she can clear with cough.   GI: I took dressings down and cleaned sites, will start dressing changes.  Sites all look fine +BS and +BM  Lab Results:  No results for input(s): WBC, HGB, HCT, PLT in the last 72 hours.  BMET  Recent Labs  09/27/15 0449 09/28/15 0507  NA 142 137  K 3.3* 4.0  CL 112* 107  CO2 24 25  GLUCOSE 145* 147*  BUN 15 18  CREATININE 1.67* 1.32*  CALCIUM 8.4* 9.0   PT/INR No results for input(s): LABPROT, INR in the last 72 hours.  No results for input(s): AST, ALT, ALKPHOS, BILITOT, PROT, ALBUMIN in the last 168 hours.   Lipase     Component Value Date/Time   LIPASE 81* 09/11/2015 2351      Studies/Results: No results found.  Medications: . amiodarone  100 mg Oral Daily  . amoxicillin-clavulanate  1 tablet Oral BID  . apixaban  2.5 mg Oral BID  . carvedilol  3.125 mg Oral BID WC  . chlorhexidine  15 mL Mouth/Throat BID  . cycloSPORINE  1 drop Both Eyes BID  . feeding supplement (PRO-STAT SUGAR FREE 64)  30 mL Per Tube BID  . fluticasone  2 spray Each Nare Daily  . free water  60 mL Per Tube Q6H  . gabapentin  300 mg Oral BID  . hydrALAZINE  50 mg Oral Q8H  . latanoprost  1 drop Both Eyes QHS  . levothyroxine  88 mcg Oral QAC breakfast  . metoCLOPramide (REGLAN) injection  5 mg Intravenous Q8H  . mometasone-formoterol  2 puff Inhalation BID  . nitroGLYCERIN  0.5 inch Topical Q12H  . pantoprazole  40 mg Oral Daily  . polyvinyl alcohol  1 drop Both Eyes QID  . potassium chloride  60 mEq Per Tube Daily  . sertraline  100 mg Oral Daily   . feeding supplement (OSMOLITE 1.5 CAL) 1,000 mL (09/27/15 2102)     S/p gastric ulcer with perforation and GJ procedure S/p perforated chronic marginal ulcer of her GJ anastomosis. She had a resection of GJ anastomosis with primary jejunojejunal anastomosis , repair  of gastric ulcer, repair of incisional hernia, EGD by Dr. Rivka Barbara and Dr; Ophelia Shoulder at [facility].  Hx of asthma/COPD AODM Hypothyroid on Synthroid, dose being adjusted Hx of SIADH Hx of PAF on Amiodarone Right 3rd toe gangrene  Assessment/Plan Gastric outlet obstruction S/p open placement of gastrojejunostomy tube,  flouroscopic manipulation of feeding tube, 09/23/15, [name], M.D. HCAP Renal insuffiencey - improving Gangrene right 3rd toe FEN: ID:  Day 17 of antibiotics Day 2 of Augmentin   DVT:  Apixaban(Eliquis)/SCD   Plan:  Continue teaching family how to care for patient with the gastrostomy in place.  Dressing changes to sites.  Follow up with Dr. Kieth Brightly.  Follow up times for staple removal and to see Dr. Kieth Brightly are in the  AVS.    LOS: 16 days    Shannon [name] 09/28/2015 [phone]

## 2015-09-28 NOTE — Progress Notes (Signed)
Nutrition Follow-up  DOCUMENTATION CODES:   Severe malnutrition in context of acute illness/injury  INTERVENTION:   Initiate Osmolite 1.5 @ 55 ml/hr at 2100. Advance by 10 ml after 4 hours to goal rate of 65 ml/hr is reached.  Provide 30 ml Prostat TID RD to monitor for tolerance. Will follow-up 6/28.  Goal: Osmolite 1.5 @ 65 ml/hr over 12 hours (2100-0900) 30 ml Prostat TID  Free water flushes 240 ml BID Tube feeding regimen provides 1470 kcal (98% of needs), 94g protein and 1074 ml H2O.  NUTRITION DIAGNOSIS:   Inadequate oral intake related to altered GI function as evidenced by meal completion < 25%.  Ongoing.  GOAL:   Patient will meet greater than or equal to 90% of their needs  Progressing.  MONITOR:   PO intake, Labs, Weight trends, TF tolerance, Skin, I & O's  REASON FOR ASSESSMENT:   Consult Enteral/tube feeding initiation and management  ASSESSMENT:   82 y.o. female with medical history significant for hypertension, hyperlipidemia, hypothyroidism, peripheral arterial disease with ischemic toe complicated by osteomyelitis, and chronic gastric distention with recurrent nausea and vomiting who presents to the ED for evaluation of abdominal pain with nausea and vomiting of 2 days' duration. Patient had a recent admission, just discharged on 09/05/2015 following management of abdominal pain with nausea and vomiting. She had an NG tube placed at that time and was started on liquid diet, essentially improving spontaneously and tolerating full diet.  RD spoke with RN regarding night feeds. RN states that the pt's feeds were advanced to 55 ml/hr and pt tolerated this rate. MD would like feeds to be advanced to goal tonight. Provided Boost coupons to pt's daughter as requested. Pt's daughter with some questions regarding feeds, all questions answered. She seems anxious about managing the tube feeds at home. Encouraged her to request education  about managing the tube  feeding pump from staff. RD to follow-up 6/28 to monitor for tolerance overnight.  Medications: Reglan IV every 8 hours, KCl daily Labs reviewed: CBGs: 106-157 Mg WNL  Diet Order:  Diet full liquid Room service appropriate?: Yes; Fluid consistency:: Thin  Skin:  Wound (see comment) (Stg II toe wound)  Last BM:  6/26  Height:   Ht Readings from Last 1 Encounters:  09/15/15 5\' 6"  (1.676 m)    Weight:   Wt Readings from Last 1 Encounters:  09/28/15 154 lb 8.7 oz (70.1 kg)    Ideal Body Weight:  59.1 kg  BMI:  Body mass index is 24.96 kg/(m^2).  Estimated Nutritional Needs:   Kcal:  1500-1700  Protein:  75-85g  Fluid:  1.5-1.7L/day  EDUCATION NEEDS:   Education needs addressed  Clayton Bibles, MS, RD, LDN Pager: [phone] After Hours Pager: [phone]

## 2015-09-29 DIAGNOSIS — E1169 Type 2 diabetes mellitus with other specified complication: Secondary | ICD-10-CM

## 2015-09-29 DIAGNOSIS — N183 Chronic kidney disease, stage 3 (moderate): Secondary | ICD-10-CM

## 2015-09-29 DIAGNOSIS — M869 Osteomyelitis, unspecified: Secondary | ICD-10-CM

## 2015-09-29 DIAGNOSIS — J690 Pneumonitis due to inhalation of food and vomit: Principal | ICD-10-CM

## 2015-09-29 DIAGNOSIS — I10 Essential (primary) hypertension: Secondary | ICD-10-CM

## 2015-09-29 DIAGNOSIS — E43 Unspecified severe protein-calorie malnutrition: Secondary | ICD-10-CM

## 2015-09-29 LAB — GLUCOSE, CAPILLARY
Glucose-Capillary: 160 mg/dL — ABNORMAL HIGH (ref 65–99)
Glucose-Capillary: 178 mg/dL — ABNORMAL HIGH (ref 65–99)
Glucose-Capillary: 159 mg/dL — ABNORMAL HIGH (ref 65–99)
Glucose-Capillary: 135 mg/dL — ABNORMAL HIGH (ref 65–99)
Glucose-Capillary: 152 mg/dL — ABNORMAL HIGH (ref 65–99)

## 2015-09-29 MED ORDER — HYDRALAZINE HCL 50 MG PO TABS
100.0000 mg | ORAL_TABLET | Freq: Three times a day (TID) | ORAL | Status: DC
  Administered 2015-09-29 – 2015-09-30 (×3): 100 mg via ORAL
  Filled 2015-09-29 (×3): qty 2

## 2015-09-29 MED ORDER — PRO-STAT SUGAR FREE PO LIQD
30.0000 mL | Freq: Two times a day (BID) | ORAL | Status: DC
  Administered 2015-09-29 – 2015-09-30 (×2): 30 mL
  Filled 2015-09-29 (×2): qty 30

## 2015-09-29 MED ORDER — HYDRALAZINE HCL 50 MG PO TABS
50.0000 mg | ORAL_TABLET | Freq: Once | ORAL | Status: AC
  Administered 2015-09-29: 50 mg via ORAL
  Filled 2015-09-29: qty 1

## 2015-09-29 MED ORDER — HYDRALAZINE HCL 50 MG PO TABS: 50.0000 mg | ORAL_TABLET | Freq: Three times a day (TID) | ORAL | Status: DC

## 2015-09-29 MED ORDER — LABETALOL HCL 100 MG PO TABS
100.0000 mg | ORAL_TABLET | Freq: Two times a day (BID) | ORAL | Status: DC
  Administered 2015-09-29 – 2015-09-30 (×2): 100 mg via ORAL
  Filled 2015-09-29 (×3): qty 1

## 2015-09-29 MED ORDER — OSMOLITE 1.5 CAL PO LIQD
1000.0000 mL | ORAL | Status: DC
  Administered 2015-09-29: 1000 mL
  Filled 2015-09-29 (×2): qty 1000

## 2015-09-29 NOTE — Progress Notes (Signed)
Physical Therapy Treatment Patient Details Name: Shannon [name] MRN: [id number] DOB: 1923/04/08 Today's Date: 09/29/2015    History of Present Illness 82 yo female admitted with HCAP and Gastic outlet obstruction 2* pyloric stenosis. Hx of HTN, Osteomyelitis of 3rd toe on R foot,  Afib, MI, ventral hernia, CVA, DM, COPD, arhtritis, PVD, neuropathy.    PT Comments    Progressing with mobility. Pt tolerated activity well on today.   Follow Up Recommendations  Home health PT;Supervision/Assistance - 24 hour     Equipment Recommendations  None recommended by PT    Recommendations for Other Services       Precautions / Restrictions Precautions Precautions: Fall Precaution Comments: G tube, feeding tube Restrictions Weight Bearing Restrictions: No    Mobility  Bed Mobility               General bed mobility comments: oob in recliner  Transfers Overall transfer level: Needs assistance Equipment used: Rolling walker (2 wheeled) Transfers: Sit to/from Stand Sit to Stand: Min guard         General transfer comment: close guard for safety. Increased time.  Ambulation/Gait Ambulation/Gait assistance: Min guard Ambulation Distance (Feet): 175 Feet Assistive device: Rolling walker (2 wheeled) Gait Pattern/deviations: Step-through pattern;Decreased stride length;Trunk flexed     General Gait Details: close guard for safety.  slow gait speed. Pt tolerated distance well.    Stairs            Wheelchair Mobility    Modified Rankin (Stroke Patients Only)       Balance                                    Cognition Arousal/Alertness: Awake/alert Behavior During Therapy: WFL for tasks assessed/performed Overall Cognitive Status: Within Functional Limits for tasks assessed                      Exercises      General Comments        Pertinent Vitals/Pain Pain Assessment: Faces Faces Pain Scale: Hurts little more Pain Location:  abdomen-"gas pain" Pain Descriptors / Indicators: Sore Pain Intervention(s): Monitored during session;Repositioned    Home Living                      Prior Function            PT Goals (current goals can now be found in the care plan section) Progress towards PT goals: Progressing toward goals    Frequency  Min 3X/week    PT Plan Current plan remains appropriate    Co-evaluation             End of Session   Activity Tolerance: Patient tolerated treatment well Patient left: in chair;with call bell/phone within reach;with family/visitor present;with chair alarm set     Time: 1100-1116 PT Time Calculation (min) (ACUTE ONLY): 16 min  Charges:  $Gait Training: 8-22 mins                    G Codes:      Weston Anna, MPT Pager: [phone]

## 2015-09-29 NOTE — Progress Notes (Signed)
PROGRESS NOTE    Shannon [name]  [id number] DOB: 04/17/1923 DOA: 09/11/2015  PCP: Kirt Boys, DO   Brief Narrative:  Shannon [name] is an 82 y.o. female with medical history significant for hypertension, paroxysmal atrial fibrillation on eliquis, hyperlipidemia, hypothyroidism, peripheral arterial disease with ischemic toe complicated by osteomyelitis, and chronic gastric distention with recurrent nausea and vomiting who presents to the ED for evaluation of abdominal pain with nausea and vomiting of 2 days' duration.    She has a past medical history of peptic ulcer disease and has underwent a loop gastrojejunostomy. In 2015 she underwent a takedown of the loop gastrojejunostomy due to an anastomotic ulcer perforation. General surgery feels that the repair of the perforation lead to pyloric stenosis and removing the gastrojejunostomy led to recurrent gastric outlet obstruction.  Patient had a recent admission, just discharged on 09/05/2015 following management of abdominal pain with nausea and vomiting. She had an NG tube placed during the last admission and was started on liquid diet, improved spontaneously. She was also noted to have an ischemic third toe on the right foot secondary to PAD and workup revealed underlying osteomyelitis. Surgery was advised for management of this, however patient declined. She was discharged with a one-month course of Augmentin.    Subjective: No complaints.   Assessment & Plan:   Principal Problem:   Aspiration pneumonia  -Secondary to vomiting-this has resolved  Active Problems: Gastric outlet obstruction - 6/22-gastro- jejunostomy tube placed by general surgery -tube feed goal 50 mL an hour-can continue full liquids during the day  Gangrene right third toe with osteomyelitis -Has declined amputation-continue Augmentin which is supposed to be continued for one month-stop date 10/05/15  AK I on Chronic kidney disease stage III -Steadily  improving-baseline creatinine is 1.04-current creatinine 1.3     Essential hypertension, benign -BP has been quite high with a systolic ranging anywhere from 170-200 -Currently on Imdur, hydralazine, Norvasc, Coreg -Increase hydralazine from 50 to 100 mg 3 times a day -Change Coreg to labetalol    DM (diabetes mellitus) type II uncontrolled, periph vascular disorder  -A1c 6.5-controlled by diet-last A1c 6.5 on 08/28/15    Hypothyroidism -Continue Synthroid  Severe protein calorie malnutrition Tube feeds as ordered-   DVT prophylaxis: Eliquis  Code Status: No CPR, no intubation-okay to give ACLS meds, defibrillate and uses BiPAP/ C Pap  Family Communication:  Disposition Plan: Home in 1-2 days  Consultants:   General surgery  Procedures:   Gastrojejunal tube  Antimicrobials:  Anti-infectives    Start     Dose/Rate Route Frequency Ordered Stop   09/27/15 1800  amoxicillin-clavulanate (AUGMENTIN) 500-125 MG per tablet 500 mg     1 tablet Oral 2 times daily 09/27/15 1351     09/26/15 1800  ampicillin-sulbactam (UNASYN) 1.5 g in sodium chloride 0.9 % 50 mL IVPB  Status:  Discontinued     1.5 g 100 mL/hr over 30 Minutes Intravenous Every 12 hours 09/26/15 1656 09/27/15 1352   09/25/15 2200  ampicillin-sulbactam (UNASYN) 1.5 g in sodium chloride 0.9 % 50 mL IVPB  Status:  Discontinued     1.5 g 100 mL/hr over 30 Minutes Intravenous Every 24 hours 09/25/15 1333 09/26/15 1656   09/24/15 1800  ampicillin-sulbactam (UNASYN) 1.5 g in sodium chloride 0.9 % 50 mL IVPB  Status:  Discontinued     1.5 g 100 mL/hr over 30 Minutes Intravenous Every 12 hours 09/24/15 0742 09/25/15 1333   09/20/15 1200  Ampicillin-Sulbactam (UNASYN) 3 g in  sodium chloride 0.9 % 100 mL IVPB  Status:  Discontinued     3 g 100 mL/hr over 60 Minutes Intravenous Every 6 hours 09/20/15 0829 09/24/15 0742   09/16/15 1200  erythromycin 250 mg in sodium chloride 0.9 % 100 mL IVPB  Status:  Discontinued     250 mg 100  mL/hr over 60 Minutes Intravenous Every 6 hours 09/16/15 0846 09/17/15 1002   09/15/15 1800  ampicillin-sulbactam (UNASYN) 1.5 g in sodium chloride 0.9 % 50 mL IVPB  Status:  Discontinued     1.5 g 100 mL/hr over 30 Minutes Intravenous Every 6 hours 09/15/15 1103 09/20/15 0829   09/15/15 1715  erythromycin (E-MYCIN) tablet 250 mg  Status:  Discontinued     250 mg Oral 3 times daily before meals & bedtime 09/15/15 1656 09/16/15 0846   09/13/15 0600  vancomycin (VANCOCIN) IVPB 750 mg/150 ml premix  Status:  Discontinued     750 mg 150 mL/hr over 60 Minutes Intravenous Every 24 hours 09/12/15 0546 09/15/15 1036   09/12/15 2200  ceFEPIme (MAXIPIME) 1 g in dextrose 5 % 50 mL IVPB  Status:  Discontinued     1 g 100 mL/hr over 30 Minutes Intravenous Daily at bedtime 09/12/15 0410 09/15/15 1036   09/12/15 0430  ceFEPIme (MAXIPIME) 1 g in dextrose 5 % 50 mL IVPB     1 g 100 mL/hr over 30 Minutes Intravenous  Once 09/12/15 0427 09/12/15 0540   09/12/15 0100  vancomycin (VANCOCIN) IVPB 1000 mg/200 mL premix     1,000 mg 200 mL/hr over 60 Minutes Intravenous  Once 09/12/15 0046 09/12/15 0400   09/12/15 0100  piperacillin-tazobactam (ZOSYN) IVPB 3.375 g     3.375 g 100 mL/hr over 30 Minutes Intravenous  Once 09/12/15 0046 09/12/15 0222       Objective: Filed Vitals:   09/29/15 1001 09/29/15 1220 09/29/15 1329 09/29/15 1439  BP: 198/88 195/77 190/73 173/74  Pulse:  68 63 66  Temp:   97.3 F (36.3 C)   TempSrc:   Oral   Resp:   16   Height:      Weight:      SpO2:   100%     Intake/Output Summary (Last 24 hours) at 09/29/15 1530 Last data filed at 09/29/15 0810  Gross per 24 hour  Intake    240 ml  Output    750 ml  Net   -510 ml   Filed Weights   09/27/15 0705 09/28/15 0500 09/29/15 0500  Weight: 72.1 kg (158 lb 15.2 oz) 70.1 kg (154 lb 8.7 oz) 68.1 kg (150 lb 2.1 oz)    Examination: General exam: Appears comfortable  HEENT: PERRLA, oral mucosa moist, no sclera icterus or  thrush Respiratory system: Clear to auscultation. Respiratory effort normal. Cardiovascular system: S1 & S2 heard, RRR.  No murmurs  Gastrointestinal system: Abdomen soft, non-tender, nondistended. Normal bowel sound. No organomegaly- G-J-tube intact  Central nervous system: Alert and oriented. No focal neurological deficits. Extremities: No cyanosis, clubbing or edema Skin:Gangrene of right third toe  Psychiatry:  Mood & affect appropriate.     Data Reviewed: I have personally reviewed following labs and imaging studies  CBC:  Recent Labs Lab 09/23/15 0509 09/24/15 0448 09/25/15 0446  WBC 12.2* 17.0* 15.4*  HGB 10.5* 10.4* 9.0*  HCT 29.6* 30.0* 26.6*  MCV 94.0 96.5 95.3  PLT 327 267 214   Basic Metabolic Panel:  Recent Labs Lab 09/24/15 0448 09/25/15 0446 09/25/15 0820  09/26/15 0503 09/27/15 0449 09/28/15 0507  NA 140 140  --  142 142 137  K 3.5 3.3*  --  3.1* 3.3* 4.0  CL 108 109  --  112* 112* 107  CO2 24 23  --  24 24 25   GLUCOSE 95 165*  --  153* 145* 147*  BUN 9 17  --  18 15 18   CREATININE 2.07* 2.77*  --  2.31* 1.67* 1.32*  CALCIUM 8.5* 8.2*  --  8.4* 8.4* 9.0  MG  --   --  1.2*  --  1.5* 1.8   GFR: Estimated Creatinine Clearance: 26.5 mL/min (by C-G formula based on Cr of 1.32). Liver Function Tests: No results for input(s): AST, ALT, ALKPHOS, BILITOT, PROT, ALBUMIN in the last 168 hours. No results for input(s): LIPASE, AMYLASE in the last 168 hours. No results for input(s): AMMONIA in the last 168 hours. Coagulation Profile: No results for input(s): INR, PROTIME in the last 168 hours. Cardiac Enzymes: No results for input(s): CKTOTAL, CKMB, CKMBINDEX, TROPONINI in the last 168 hours. BNP (last 3 results) No results for input(s): PROBNP in the last 8760 hours. HbA1C: No results for input(s): HGBA1C in the last 72 hours. CBG:  Recent Labs Lab 09/28/15 2020 09/28/15 2338 09/29/15 0359 09/29/15 0743 09/29/15 1134  GLUCAP 105* 122* 160* 178*  159*   Lipid Profile: No results for input(s): CHOL, HDL, LDLCALC, TRIG, CHOLHDL, LDLDIRECT in the last 72 hours. Thyroid Function Tests: No results for input(s): TSH, T4TOTAL, FREET4, T3FREE, THYROIDAB in the last 72 hours. Anemia Panel: No results for input(s): VITAMINB12, FOLATE, FERRITIN, TIBC, IRON, RETICCTPCT in the last 72 hours. Urine analysis:    Component Value Date/Time   COLORURINE YELLOW 09/12/2015 0036   APPEARANCEUR CLEAR 09/12/2015 0036   LABSPEC 1.025 09/12/2015 0036   PHURINE 6.5 09/12/2015 0036   GLUCOSEU NEGATIVE 09/12/2015 0036   HGBUR NEGATIVE 09/12/2015 0036   BILIRUBINUR NEGATIVE 09/12/2015 0036   KETONESUR NEGATIVE 09/12/2015 0036   PROTEINUR 100* 09/12/2015 0036   UROBILINOGEN 0.2 01/27/2015 2308   NITRITE NEGATIVE 09/12/2015 0036   LEUKOCYTESUR NEGATIVE 09/12/2015 0036   Sepsis Labs: @LABRCNTIP (procalcitonin:4,lacticidven:4) )No results found for this or any previous visit (from the past 240 hour(s)).       Radiology Studies: No results found.    Scheduled Meds: . amiodarone  100 mg Oral Daily  . amLODipine  10 mg Oral Daily  . amoxicillin-clavulanate  1 tablet Oral BID  . apixaban  2.5 mg Oral BID  . carvedilol  3.125 mg Oral BID WC  . chlorhexidine  15 mL Mouth/Throat BID  . cycloSPORINE  1 drop Both Eyes BID  . feeding supplement (OSMOLITE 1.5 CAL)  1,000 mL Per Tube Q24H  . feeding supplement (PRO-STAT SUGAR FREE 64)  30 mL Per Tube BID  . fluticasone  2 spray Each Nare Daily  . free water  60 mL Per Tube Q6H  . gabapentin  300 mg Oral BID  . hydrALAZINE  100 mg Oral Q8H  . isosorbide mononitrate  30 mg Oral Daily  . latanoprost  1 drop Both Eyes QHS  . levothyroxine  88 mcg Oral QAC breakfast  . metoCLOPramide (REGLAN) injection  5 mg Intravenous Q8H  . mometasone-formoterol  2 puff Inhalation BID  . nitroGLYCERIN  0.5 inch Topical Q12H  . pantoprazole  40 mg Oral Daily  . polyvinyl alcohol  1 drop Both Eyes QID  . potassium  chloride  60 mEq Per Tube Daily  .  sertraline  100 mg Oral Daily   Continuous Infusions:    LOS: 17 days    Time spent in minutes: 35 minutes   Casee Knepp, MD [facility] Pager: [email or web address] Password [email or web address] 09/29/2015, 3:30 PM

## 2015-09-29 NOTE — Progress Notes (Signed)
Nutrition Follow-up  DOCUMENTATION CODES:   Severe malnutrition in context of acute illness/injury  INTERVENTION:  - Will change TF order: Osmolite 1.5 @ 50 mL/hr over 16 hours (1800-1000) with 30 mL Prostat BID. This regimen will provide 1400 kcal (93% minimum estimated needs), 80 grams of protein, and 610 mL free water.  - Continue to encourage PO intakes of meals .  - RD will follow-up 6/29 if pt has not d/c'ed prior to that date.  NUTRITION DIAGNOSIS:   Inadequate oral intake related to altered GI function as evidenced by meal completion < 25%. -improving.  GOAL:   Patient will meet greater than or equal to 90% of their needs -met with current regimen.  MONITOR:   PO intake, Labs, Weight trends, TF tolerance, Skin, I & O's  ASSESSMENT:   82 y.o. female with medical history significant for hypertension, hyperlipidemia, hypothyroidism, peripheral arterial disease with ischemic toe complicated by osteomyelitis, and chronic gastric distention with recurrent nausea and vomiting who presents to the ED for evaluation of abdominal pain with nausea and vomiting of 2 days' duration. Patient had a recent admission, just discharged on 09/05/2015 following management of abdominal pain with nausea and vomiting. She had an NG tube placed at that time and was started on liquid diet, essentially improving spontaneously and tolerating full diet.  6/28 Spoke with pt, no family at bedside. Pt consumed 100% of a cup of jello and a cup of yogurt for lunch today and reports no abdominal discomfort with PO intakes this afternoon.   Osmolite 1.5 @ 55 mL/hr via J-tube from 2100-0100 last night and then increased to goal rate of 65 mL/hr until 0900. Goal rate of TF regimen outlined below. Noted that RD note yesterday states 240 mL free water BID but order in place for 60 mL every 6 hours.  Pt reports feelings of fullness/bloating when she woke up this AM. RN in the room at time of RD visit and reports  G-tube unclamped and some drainage of TF-colored material into Foley bag following unclamping. RN unsure of amount of drainage obtained. She states that in report this AM RN reported that unclamping needed to occur yesterday AM and PM as well and that at one time 700cc drainage was present.  Osmolite 1.5 is a fiber-free TF formula. With goal rate, pt is receiving 780 mL of TF over 12 hours. Will adjust TF regimen as outlined above and follow-up 6/29 to assess for improvement in discomfort/need for unclamping of G-tube.  Medications reviewed. Labs reviewed; CBGs: 159-178 mg/dL, creatinine elevated, GFR: 40 mg/dL.    6/27 RD spoke with RN regarding night feeds. RN states that the pt's feeds were advanced to 55 ml/hr and pt tolerated this rate. MD would like feeds to be advanced to goal tonight. Provided Boost coupons to pt's daughter as requested. Pt's daughter with some questions regarding feeds, all questions answered. She seems anxious about managing the tube feeds at home. Encouraged her to request education about managing the tube feeding pump from staff. RD to follow-up 6/28 to monitor for tolerance overnight.  Goal: Osmolite 1.5 @ 65 ml/hr over 12 hours (2100-0900) 30 ml Prostat TID  Free water flushes 240 ml BID Tube feeding regimen provides 1470 kcal (98% of needs), 94g protein and 1074 ml H2O.   Diet Order:  Diet full liquid Room service appropriate?: Yes; Fluid consistency:: Thin  Skin:  Wound (see comment) (Stage 2 R toe ulcer, Abdominal incision from 09/23/15)  Last BM:  6/28  Height:   Ht Readings from Last 1 Encounters:  09/15/15 '5\' 6"'  (1.676 m)    Weight:   Wt Readings from Last 1 Encounters:  09/29/15 150 lb 2.1 oz (68.1 kg)    Ideal Body Weight:  59.1 kg  BMI:  Body mass index is 24.24 kg/(m^2).  Estimated Nutritional Needs:   Kcal:  1500-1700  Protein:  75-85g  Fluid:  1.5-1.7L/day  EDUCATION NEEDS:   Education needs addressed     Jarome Matin, MS, RD, LDN Inpatient Clinical Dietitian Pager # [phone] After hours/weekend pager # [phone]

## 2015-09-29 NOTE — Progress Notes (Signed)
Pt discharging with TF and Osmolite, from [facility] and will need orders.

## 2015-09-29 NOTE — Care Management Important Message (Signed)
Important Message  Patient Details  Name: Shannon [name] MRN: [id number] Date of Birth: 03-Dec-1923   Medicare Important Message Given:  Yes    Camillo Flaming 09/29/2015, 12:00 [place] Message  Patient Details  Name: Shannon [name] MRN: [id number] Date of Birth: 1923/12/03   Medicare Important Message Given:  Yes    Camillo Flaming 09/29/2015, 12:00 PM

## 2015-09-29 NOTE — Progress Notes (Signed)
6 Days Post-Op  Subjective: She is up in the chair and says she feels fine.  She says she gets very full sometimes, but is otherwise doing well.  No nausea or vomiting with PO's or tube feeds.  Objective: Vital signs in last 24 hours: Temp:  [97.9 F (36.6 C)-98.1 F (36.7 C)] 97.9 F (36.6 C) (06/28 0446) Pulse Rate:  [64-102] 68 (06/28 0757) Resp:  [16-20] 16 (06/28 0446) BP: (165-199)/(84-104) 199/84 mmHg (06/28 0757) SpO2:  [97 %-99 %] 98 % (06/28 0921) Weight:  [68.1 kg (150 lb 2.1 oz)] 68.1 kg (150 lb 2.1 oz) (06/28 0500) Last BM Date: 09/29/15  Intake/Output from previous day: 06/27 0701 - 06/28 0700 In: 140 [NG/GT:110] Out: 750 [Drains:750] Intake/Output this shift: Total I/O In: 240 [P.O.:240] Out: -   General appearance: alert, cooperative and no distress GI: soft, midline incision looks fine, G tube site looks fine.  Lab Results:  No results for input(s): WBC, HGB, HCT, PLT in the last 72 hours.  BMET  Recent Labs  09/27/15 0449 09/28/15 0507  NA 142 137  K 3.3* 4.0  CL 112* 107  CO2 24 25  GLUCOSE 145* 147*  BUN 15 18  CREATININE 1.67* 1.32*  CALCIUM 8.4* 9.0   PT/INR No results for input(s): LABPROT, INR in the last 72 hours.  No results for input(s): AST, ALT, ALKPHOS, BILITOT, PROT, ALBUMIN in the last 168 hours.   Lipase     Component Value Date/Time   LIPASE 81* 09/11/2015 2351     Studies/Results: No results found.  Medications: . amiodarone  100 mg Oral Daily  . amLODipine  10 mg Oral Daily  . amoxicillin-clavulanate  1 tablet Oral BID  . apixaban  2.5 mg Oral BID  . carvedilol  3.125 mg Oral BID WC  . chlorhexidine  15 mL Mouth/Throat BID  . cycloSPORINE  1 drop Both Eyes BID  . feeding supplement (OSMOLITE 1.5 CAL)  1,000 mL Per Tube Q24H  . feeding supplement (PRO-STAT SUGAR FREE 64)  30 mL Per Tube TID  . fluticasone  2 spray Each Nare Daily  . free water  60 mL Per Tube Q6H  . gabapentin  300 mg Oral BID  . hydrALAZINE   100 mg Oral Q8H  . isosorbide mononitrate  30 mg Oral Daily  . latanoprost  1 drop Both Eyes QHS  . levothyroxine  88 mcg Oral QAC breakfast  . metoCLOPramide (REGLAN) injection  5 mg Intravenous Q8H  . mometasone-formoterol  2 puff Inhalation BID  . nitroGLYCERIN  0.5 inch Topical Q12H  . pantoprazole  40 mg Oral Daily  . polyvinyl alcohol  1 drop Both Eyes QID  . potassium chloride  60 mEq Per Tube Daily  . sertraline  100 mg Oral Daily   S/p gastric ulcer with perforation and GJ procedure S/p perforated chronic marginal ulcer of her GJ anastomosis. She had a resection of GJ anastomosis with primary jejunojejunal anastomosis , repair of gastric ulcer, repair of incisional hernia, EGD by Dr. Rivka Barbara and Dr; Ophelia Shoulder at [facility].  Hx of asthma/COPD AODM Hypothyroid on Synthroid, dose being adjusted Hx of SIADH Hx of PAF on Amiodarone Right 3rd toe gangrene  Assessment/Plan Gastric outlet obstruction S/p open placement of gastrojejunostomy tube, flouroscopic manipulation of feeding tube, 09/23/15, [name], M.D. HCAP Renal insuffiencey - improving Gangrene right 3rd toe FEN:  Tube feeds goal 65 ml/hr PM feeds/clears liquids ID: Day 17 of antibiotics Day 3 of  Augmentin  DVT: Apixaban(Eliquis)/SCD   Plan:  We will see her again as needed.  She has follow up in the AVS with dates and times for suture removal and to see Dr. Kieth Brightly.      LOS: 17 days    Cassie Shedlock 09/29/2015 [phone]

## 2015-09-30 DIAGNOSIS — M86171 Other acute osteomyelitis, right ankle and foot: Secondary | ICD-10-CM

## 2015-09-30 DIAGNOSIS — L8990 Pressure ulcer of unspecified site, unspecified stage: Secondary | ICD-10-CM

## 2015-09-30 DIAGNOSIS — Z431 Encounter for attention to gastrostomy: Secondary | ICD-10-CM | POA: Diagnosis not present

## 2015-09-30 DIAGNOSIS — D638 Anemia in other chronic diseases classified elsewhere: Secondary | ICD-10-CM

## 2015-09-30 DIAGNOSIS — E038 Other specified hypothyroidism: Secondary | ICD-10-CM

## 2015-09-30 DIAGNOSIS — E875 Hyperkalemia: Secondary | ICD-10-CM

## 2015-09-30 DIAGNOSIS — Z48815 Encounter for surgical aftercare following surgery on the digestive system: Secondary | ICD-10-CM | POA: Diagnosis not present

## 2015-09-30 DIAGNOSIS — L97903 Non-pressure chronic ulcer of unspecified part of unspecified lower leg with necrosis of muscle: Secondary | ICD-10-CM | POA: Diagnosis not present

## 2015-09-30 DIAGNOSIS — I739 Peripheral vascular disease, unspecified: Secondary | ICD-10-CM | POA: Diagnosis not present

## 2015-09-30 DIAGNOSIS — K3189 Other diseases of stomach and duodenum: Secondary | ICD-10-CM

## 2015-09-30 LAB — CBC
HCT: 23.7 % — ABNORMAL LOW (ref 36.0–46.0)
Hemoglobin: 8.4 g/dL — ABNORMAL LOW (ref 12.0–15.0)
MCH: 33.2 pg (ref 26.0–34.0)
MCHC: 35.4 g/dL (ref 30.0–36.0)
MCV: 93.7 fL (ref 78.0–100.0)
Platelets: 266 10*3/uL (ref 150–400)
RBC: 2.53 MIL/uL — ABNORMAL LOW (ref 3.87–5.11)
RDW: 17.2 % — ABNORMAL HIGH (ref 11.5–15.5)
WBC: 9.5 10*3/uL (ref 4.0–10.5)

## 2015-09-30 LAB — GLUCOSE, CAPILLARY
Glucose-Capillary: 124 mg/dL — ABNORMAL HIGH (ref 65–99)
Glucose-Capillary: 140 mg/dL — ABNORMAL HIGH (ref 65–99)
Glucose-Capillary: 145 mg/dL — ABNORMAL HIGH (ref 65–99)
Glucose-Capillary: 164 mg/dL — ABNORMAL HIGH (ref 65–99)

## 2015-09-30 LAB — BASIC METABOLIC PANEL
Anion gap: 5 (ref 5–15)
BUN: 22 mg/dL — ABNORMAL HIGH (ref 6–20)
CO2: 27 mmol/L (ref 22–32)
Calcium: 9.1 mg/dL (ref 8.9–10.3)
Chloride: 101 mmol/L (ref 101–111)
Creatinine, Ser: 1.37 mg/dL — ABNORMAL HIGH (ref 0.44–1.00)
GFR calc Af Amer: 38 mL/min — ABNORMAL LOW (ref >60)
GFR calc non Af Amer: 33 mL/min — ABNORMAL LOW (ref >60)
Glucose, Bld: 161 mg/dL — ABNORMAL HIGH (ref 65–99)
Potassium: 3.9 mmol/L (ref 3.5–5.1)
Sodium: 133 mmol/L — ABNORMAL LOW (ref 135–145)

## 2015-09-30 MED ORDER — PRO-STAT SUGAR FREE PO LIQD: 30.0000 mL | Freq: Two times a day (BID) | ORAL | Status: DC

## 2015-09-30 MED ORDER — LABETALOL HCL 100 MG PO TABS: 100.0000 mg | ORAL_TABLET | Freq: Two times a day (BID) | ORAL | Status: DC

## 2015-09-30 MED ORDER — OSMOLITE 1.5 CAL PO LIQD: 1000.0000 mL | ORAL | Status: DC

## 2015-09-30 MED ORDER — BISACODYL 10 MG RE SUPP: 10.0000 mg | Freq: Every day | RECTAL | Status: DC | PRN

## 2015-09-30 MED ORDER — FREE WATER: 60.0000 mL | Freq: Four times a day (QID) | Status: DC

## 2015-09-30 NOTE — Progress Notes (Signed)
NUTRITION NOTE  Pt seen for full follow-up assessment yesterday afternoon. D/c order in place at this time. Pt's door closed upon arrival and RN who opened the door reports preparing pt for d/c. Talked with Case Manager who reports no issues with TF for home use.    Jarome Matin, MS, RD, LDN Inpatient Clinical Dietitian Pager # [phone] After hours/weekend pager # [phone]

## 2015-09-30 NOTE — Discharge Summary (Signed)
Physician Discharge Summary  Shannon [name] ZOX:[id number] DOB: 1923-08-26 DOA: 09/11/2015  PCP: Kirt Boys, DO  Admit date: 09/11/2015 Discharge date: 09/30/2015  Admitted From: home Disposition:  home   Recommendations for Outpatient Follow-up:  1. Follow up with PCP in 1-2 weeks  Home Health:  Yes, PT, RN Equipment/Devices:  none  Discharge Condition: stable  CODE STATUS:  Do not intubate or do CPR, ok to use BiPAP/ CPAP, administer ACLS meds and defibrillate/cardiovert Diet recommendation:  Full liquids Consultations:  gen surgery    Discharge Diagnoses:  Principal Problem:   Aspiration pneumonia (HCC) Active Problems:   Acute osteomyelitis of toe of right foot (HCC)   Pyloric stenosis   Acute renal failure superimposed on stage 3 chronic kidney disease (HCC)   Atrial fibrillation (HCC)   Essential hypertension, benign   DM (diabetes mellitus) type II uncontrolled, periph vascular disorder (HCC)   Hypothyroidism   Depression   Anemia of chronic disease   Protein-calorie malnutrition, severe   PVD (peripheral vascular disease) (HCC)   Hyperkalemia   Pressure ulcer   Subjective: Shannon [name] is an 82 y.o. female with medical history significant for hypertension, paroxysmal atrial fibrillation on eliquis, hyperlipidemia, hypothyroidism, peripheral arterial disease with ischemic 3rd toe toe complicated by osteomyelitis and chronic gastric distention with recurrent nausea and vomiting who presents to the ED for evaluation of abdominal pain with nausea and vomiting of 2 days duration and is found to have pneumonia.    She has a past medical history of peptic ulcer disease and has undergone a loop gastrojejunostomy. In 2015 she underwent a takedown of the loop gastrojejunostomy due to an anastomotic ulcer perforation. General surgery feels that the repair of the perforation lead to pyloric stenosis and removing the gastrojejunostomy led to recurrent gastric outlet  obstruction.  Patient had a recent admission and was just discharged on 09/05/2015 for the same issues. She had an NG tube placed during the last admission and was started on liquid diet and did well. She was also noted to have an ischemic third toe on the right foot secondary to PAD and workup revealed underlying osteomyelitis. Amputation was advised for management of this, however patient declined. She was discharged with a one-month course of Augmentin.   Principal Problem:  Aspiration pneumonia  -Secondary to vomiting-  resolved  Active Problems: Gastric outlet obstruction - 6/22- gastro- jejunostomy tube placed by general surgery -tube feed (Osmolite) at goal 50 mL an hour- free water flushes being given- she can continue full liquids during the day in between tube feeds if she needs- she can take pills orally  Gangrene right third toe with osteomyelitis -Has declined amputation-continue Augmentin which is supposed to be continued for one month-stop date 10/05/15  AK I on Chronic kidney disease stage III -Steadily improving-baseline creatinine is 1.04-current creatinine 1.3    Essential hypertension, benign -BP has been quite high with a systolic ranging anywhere from 170-200 -Currently on Imdur, hydralazine, Norvasc, Coreg -Increase hydralazine from 50 to 100 mg 3 times a day -Changed Coreg to labetalol- BP significantly improved now with SBP 150s   DM (diabetes mellitus) type II uncontrolled, periph vascular disorder  -A1c 6.5-controlled by diet-last A1c 6.5 on 08/28/15   Hypothyroidism -Continue Synthroid  Severe protein calorie malnutrition Tube feeds and prostat as ordered-  Brief Summary:    Discharge Instructions      Discharge Instructions    Call MD for:  persistant nausea and vomiting    Complete by:  As  directed      Call MD for:  severe uncontrolled pain    Complete by:  As directed      Care order/instruction    Complete by:  As directed    Gastrostomy portion of the GJ tube to be clamped unless she has nausea, abdominal distension or vomiting.  If this occurs open gastrotomy to straight gravity drain, and leave till symptoms are relieved.   Flush jejunostomy frequently even when tube feedings are off.   Any Pills should be PO or thru gastrostomy portion of the feeding tube.  No crushed pill of any type thru the jejunostomy feeding portion of the GJ tube.     Discharge instructions    Complete by:  As directed   Full liquid diet, low sodium     Increase activity slowly    Complete by:  As directed             Medication List    STOP taking these medications        carvedilol 3.125 MG tablet  Commonly known as:  COREG     metoCLOPramide 5 MG tablet  Commonly known as:  REGLAN     polyethylene glycol packet  Commonly known as:  MIRALAX / GLYCOLAX  Replaced by:  polyethylene glycol powder powder     sennosides-docusate sodium 8.6-50 MG tablet  Commonly known as:  SENOKOT-S      TAKE these medications        acetaminophen 325 MG tablet  Commonly known as:  TYLENOL  Take 325 mg by mouth every 6 (six) hours as needed for mild pain or fever.     ADVAIR DISKUS 250-50 MCG/DOSE Aepb  Generic drug:  Fluticasone-Salmeterol  INHALE 1 PUFF INTO THE LUNGS 2 (TWO) TIMES DAILY AS NEEDED FOR SHORTNESS OF BREATH     albuterol 108 (90 Base) MCG/ACT inhaler  Commonly known as:  PROVENTIL HFA;VENTOLIN HFA  Inhale 2 puffs into the lungs every 6 (six) hours as needed for wheezing or shortness of breath.     amiodarone 100 MG tablet  Commonly known as:  PACERONE  Take 1 tablet (100 mg total) by mouth daily.     amLODipine 10 MG tablet  Commonly known as:  NORVASC  TAKE 1 TABLET BY MOUTH DAILY FOR HYPERTENSION     amoxicillin-clavulanate 500-125 MG tablet  Commonly known as:  AUGMENTIN  Take 1 tablet (500 mg total) by mouth 2 (two) times daily.     apixaban 2.5 MG Tabs tablet  Commonly known as:  ELIQUIS  Take 1 tablet  (2.5 mg total) by mouth 2 (two) times daily.     atorvastatin 10 MG tablet  Commonly known as:  LIPITOR  Take one tablet by mouth once daily     b complex vitamins tablet  Take 1 tablet by mouth daily.     bisacodyl 10 MG suppository  Commonly known as:  DULCOLAX  Place 1 suppository (10 mg total) rectally daily as needed for moderate constipation.     calcium-vitamin D 500-200 MG-UNIT tablet  Commonly known as:  OSCAL WITH D  Take 1 tablet by mouth daily with breakfast.     chlorhexidine 0.12 % solution  Commonly known as:  PERIDEX  Use as directed 15 mLs in the mouth or throat 2 (two) times daily.     [employer] GAS RELIEF 80 MG chewable tablet  Generic drug:  simethicone  CHEW 1 TABLET BY MOUTH 4 TIMES A DAY AS  NEEDED FOR FLATULENCE     diclofenac sodium 1 % Gel  Commonly known as:  VOLTAREN  Apply 2 g topically daily as needed (for pain).     feeding supplement (PRO-STAT SUGAR FREE 64) Liqd  Place 30 mLs into feeding tube 2 (two) times daily.     feeding supplement Liqd  Take 1 Container by mouth 3 (three) times daily between meals.     feeding supplement (OSMOLITE 1.5 CAL) Liqd  Place 1,000 mLs into feeding tube daily.     fluticasone 50 MCG/ACT nasal spray  Commonly known as:  FLONASE  Place 2 sprays into both nostrils daily.     free water Soln  Place 60 mLs into feeding tube every 6 (six) hours.     gabapentin 300 MG capsule  Commonly known as:  NEURONTIN  Take 2 caps po in AM and qhs, take 1 cap daily at NOON     hydrALAZINE 50 MG tablet  Commonly known as:  APRESOLINE  Take 1 tablet (50 mg total) by mouth 4 (four) times daily.     isosorbide mononitrate 30 MG 24 hr tablet  Commonly known as:  IMDUR  TAKE 1 TABLET (30 MG TOTAL) BY MOUTH DAILY.     labetalol 100 MG tablet  Commonly known as:  NORMODYNE  Take 1 tablet (100 mg total) by mouth 2 (two) times daily.     lactulose 10 GM/15ML solution  Commonly known as:  CHRONULAC  Take 15 mLs (10 g total) by  mouth daily as needed for mild constipation.     latanoprost 0.005 % ophthalmic solution  Commonly known as:  XALATAN  Place 1 drop into both eyes at bedtime.     levothyroxine 88 MCG tablet  Commonly known as:  SYNTHROID, LEVOTHROID  Take 1 tablet (88 mcg total) by mouth daily before breakfast.     ondansetron 4 MG tablet  Commonly known as:  ZOFRAN  Take 1 tablet (4 mg total) by mouth every 8 (eight) hours as needed for nausea or vomiting.     pantoprazole 40 MG tablet  Commonly known as:  PROTONIX  TAKE 1 TABLET BY MOUTH TWICE A DAY     Polyethyl Glycol-Propyl Glycol 0.4-0.3 % Soln  Place 1 drop into both eyes 4 (four) times daily.     polyethylene glycol powder powder  Commonly known as:  GLYCOLAX/MIRALAX  MIX 17GRAMS IN 8 OUNCES OF LIQUID AND DRINK TWICE DAILY AS NEEDED     RESTASIS 0.05 % ophthalmic emulsion  Generic drug:  cycloSPORINE  USE 1 DROP INTO BOTH EYES TWICE DAILY     sertraline 100 MG tablet  Commonly known as:  ZOLOFT  Take one tablet by mouth once daily for depression     sodium chloride 1 g tablet  TAKE 1 TABLET BY MOUTH THREE TIMES DAILY     traMADol 50 MG tablet  Commonly known as:  ULTRAM  Take 50 mg by mouth 2 (two) times daily as needed for moderate pain.       Follow-up Information    Follow up with Rodman Pickle, MD On 10/22/2015.   Specialty:  General Surgery   Why:  Your appointment is at 12:15 PM, be at the office at 30 minutes early for check in.   Contact information:   [address] [place] [address] [phone]       Follow up with [facility] On 10/08/2015.   Specialty:  General  Surgery   Why:  for staple removal.  Your appointment is at 10:00 AM, be there 30 minutes early for check in.   Contact information:   [address] [place] [address] [phone]       Follow up with Kirt Boys, DO In 1 week.   Specialty:  Internal Medicine   Why:  need to monitor gangrene    Contact information:   [address] [place] [address] [phone]      No Known Allergies   Procedures/Studies: Dg Chest 2 View  09/11/2015  CLINICAL DATA:  13-year-old female with shortness of breath and tachypnea EXAM: CHEST  2 VIEW COMPARISON:  Chest radiograph dated 08/26/2015 FINDINGS: Two views of the chest demonstrate a small left pleural effusion, new from prior study. There is associated partial compressive atelectasis of the left lung base. Pneumonia is not excluded. The right lung is clear. There is no pneumothorax. Top-normal cardiac silhouette. There is degenerative changes of the shoulders. No acute osseous pathology. IMPRESSION: Small left pleural effusion with left lung base atelectasis versus infiltrate. Electronically Signed   By: Elgie Collard M.D.   On: 09/11/2015 23:56   Dg Abd 1 View  09/23/2015  CLINICAL DATA:  Gastrostomy tube placement. EXAM: ABDOMEN - 1 VIEW; DG C-ARM 1-60 MIN-NO REPORT COMPARISON:  09/22/2015 upper GI. FINDINGS: Fluoroscopy time 0.5 minutes. Single nondiagnostic spot fluoroscopic intraoperative radiograph demonstrates gastrostomy tube tip in the third portion of duodenum. IMPRESSION: Intraoperative fluoroscopic guidance for gastrostomy tube placement. Electronically Signed   By: Delbert Phenix M.D.   On: 09/23/2015 13:36   Ct Abdomen Pelvis W Contrast  09/12/2015  CLINICAL DATA:  83 year old female with abdominal pain. History of small-bowel obstruction. EXAM: CT ABDOMEN AND PELVIS WITH CONTRAST TECHNIQUE: Multidetector CT imaging of the abdomen and pelvis was performed using the standard protocol following bolus administration of intravenous contrast. CONTRAST:  80mL ISOVUE-300 IOPAMIDOL (ISOVUE-300) INJECTION 61% COMPARISON:  CT dated 08/26/2015 is FINDINGS: There is a partially visualized small left pleural effusion. Patchy areas of airspace density at the lung bases as well as multiple ground-glass and nodular density at the left lung base  are concerning for pneumonia. Clinical correlation is recommended. There is coronary vascular calcification. No intra-abdominal free air.  No free fluid. Cholecystectomy. Multiple small scattered hepatic hypodense lesions of measuring up to 9 mm are not well characterized but likely represent cysts or hemangioma. Cholecystectomy. The pancreas, and the adrenal glands appear unremarkable. Subcentimeter splenic hypodense lesion is not characterized but likely represents a cyst or hemangioma. There is no hydronephrosis on either side. The visualized ureters appear grossly unremarkable. The urinary bladder is partially distended. There is mild haziness of the bladder wall. Correlation with urinalysis recommended to exclude cystitis. The uterus is grossly unremarkable. There is mild thickened appearance of the distal esophagus which may represent esophagitis or esophageal irritation related to reflux. The stomach is distended with gastric contents, likely related to the reported chronically impaired gastric emptying. There is thickened appearance of the gastric antrum. There is mild haziness of the proximal duodenum concerning for duodenitis. There is postsurgical changes of bowel with duodenojejunal anastomosis. There is no evidence of small-bowel obstruction. There are scattered sigmoid and colonic diverticula without active inflammatory changes. There is postsurgical changes of partial sigmoid resection with anastomotic suture. There is advanced aortoiliac atherosclerotic disease. The origins of the celiac axis, SMA, and IMA appear patent. No portal venous gas identified. There is no adenopathy. There is midline  vertical anterior pelvic wall incisional scar. There is a 5 cm defect in the anterior peritoneal wall along the surgical incision with protrusion of a short segment of the colon. A small fat containing left para incisional hernia noted. There is diffuse stranding of the subcutaneous fat. No fluid collection or  hematoma. There is osteopenia with multilevel degenerative changes of the spine. T11 compression deformity similar to prior study. No acute fracture. IMPRESSION: Small left pleural effusion with findings concerning for bibasilar pneumonia. Clinical correlation and follow-up recommended. Severe gastric distention likely related to chronic gastric dysmotility or partial gastric outlet obstruction. Clinical correlation is recommended. There has been interval increase in the gastric distension compared the prior CT. Mild haziness of the proximal duodenum concerning for duodenitis. No evidence of small-bowel obstruction. Esophagitis. Colonic diverticulosis without active inflammatory changes. Electronically Signed   By: Elgie Collard M.D.   On: 09/12/2015 00:33   US Renal  09/25/2015  CLINICAL DATA:  Acute kidney insufficiency EXAM: RENAL / URINARY TRACT ULTRASOUND COMPLETE COMPARISON:  CT scan 09/11/2015 FINDINGS: Right Kidney: Length: 10.1 cm. There is cortical increased echogenicity suspicious for medical renal disease. No hydronephrosis. No shadowing renal calculi. No renal mass. Left Kidney: Length: 10.2 cm. Cortical increased echogenicity suspicious for medical renal disease. No hydronephrosis. No renal calculi. No renal mass. Bladder: Urinary bladder is not visualized. Small abdominal ascites. Right pleural effusion. IMPRESSION: 1. Bilateral renal mild increased echogenicity suspicious for medical renal disease. Bilateral no hydronephrosis or renal calculi. Urinary bladder is not visualized. Small abdominal ascites. Right pleural effusion. Electronically Signed   By: Natasha Mead M.D.   On: 09/25/2015 13:54   Dg Abd Portable 1v  09/12/2015  CLINICAL DATA:  Nausea. EXAM: PORTABLE ABDOMEN - 1 VIEW COMPARISON:  CT scan from yesterday FINDINGS: There is a paucity of bowel gas which limits evaluation. No evidence of small bowel obstruction on this study. The stomach may still be distended with debris. Contrast  is seen in the left renal collecting system from yesterday's CT scan. No other acute abnormalities. IMPRESSION: No small bowel obstruction identified. The stomach is not as well evaluated on this study but may still be distended with debris. Electronically Signed   By: Gerome Sam III M.D   On: 09/12/2015 07:07   Dg [name] W/water Sol Cm  09/22/2015  CLINICAL DATA:  Gastric outlet obstruction, pyloric stenosis EXAM: WATER SOLUBLE UPPER GI SERIES TECHNIQUE: Single-column upper GI series was performed using water soluble contrast. CONTRAST:  60 mL cc of water-soluble contrast COMPARISON:  CT 09/11/2015 FLUOROSCOPY TIME:  Radiation Exposure Index (as provided by the fluoroscopic device): 33.7 mGy If the device does not provide the exposure index: Fluoroscopy Time (in minutes and seconds): Number of Acquired Images: FINDINGS: 60 cc of water-soluble contrast and 50 cc of water was hand injected into the stomach via the NG tube. The contrast fills the stomach which is nondistended. Persistent narrowing at the gastric pylorus. Small trickle of contrast does pass through the duodenum. IMPRESSION: No evidence of high-grade obstruction. Stenosis through the pyloric region of stomach. Electronically Signed   By: Genevive Bi M.D.   On: 09/22/2015 13:17   Dg C-arm 1-60 Min-no Report  09/23/2015  CLINICAL DATA: surgery C-ARM 1-60 MINUTES Fluoroscopy was utilized by the requesting physician.  No radiographic interpretation.       Discharge Exam: Filed Vitals:   09/29/15 2130 09/30/15 0454  BP: 147/59 159/55  Pulse: 68 67  Temp: 98.7 F (37.1 C) 98.2 F (  36.8 C)  Resp: 20 20   Filed Vitals:   09/29/15 1439 09/29/15 2011 09/29/15 2130 09/30/15 0454  BP: 173/74  147/59 159/55  Pulse: 66 68 68 67  Temp:   98.7 F (37.1 C) 98.2 F (36.8 C)  TempSrc:   Oral Oral  Resp:  16 20 20   Height:      Weight:    69 kg (152 lb 1.9 oz)  SpO2:  98% 97% 98%    General: Pt is alert, awake, not in acute  distress Cardiovascular: RRR, S1/S2 +, no rubs, no gallops Respiratory: CTA bilaterally, no wheezing, no rhonchi Abdominal: Soft, NT, ND, bowel sounds + Extremities: no edema, no cyanosis    The results of significant diagnostics from this hospitalization (including imaging, microbiology, ancillary and laboratory) are listed below for reference.     Microbiology: No results found for this or any previous visit (from the past 240 hour(s)).   Labs: BNP (last 3 results)  Recent Labs  06/20/15 1807 08/02/15 1638 08/26/15 0630  BNP 238.3* 731.4* 39.0   Basic Metabolic Panel:  Recent Labs Lab 09/25/15 0446 09/25/15 0820 09/26/15 0503 09/27/15 0449 09/28/15 0507 09/30/15 0453  NA 140  --  142 142 137 133*  K 3.3*  --  3.1* 3.3* 4.0 3.9  CL 109  --  112* 112* 107 101  CO2 23  --  24 24 25 27   GLUCOSE 165*  --  153* 145* 147* 161*  BUN 17  --  18 15 18  22*  CREATININE 2.77*  --  2.31* 1.67* 1.32* 1.37*  CALCIUM 8.2*  --  8.4* 8.4* 9.0 9.1  MG  --  1.2*  --  1.5* 1.8  --    Liver Function Tests: No results for input(s): AST, ALT, ALKPHOS, BILITOT, PROT, ALBUMIN in the last 168 hours. No results for input(s): LIPASE, AMYLASE in the last 168 hours. No results for input(s): AMMONIA in the last 168 hours. CBC:  Recent Labs Lab 09/24/15 0448 09/25/15 0446 09/30/15 0453  WBC 17.0* 15.4* 9.5  HGB 10.4* 9.0* 8.4*  HCT 30.0* 26.6* 23.7*  MCV 96.5 95.3 93.7  PLT 267 214 266   Cardiac Enzymes: No results for input(s): CKTOTAL, CKMB, CKMBINDEX, TROPONINI in the last 168 hours. BNP: Invalid input(s): POCBNP CBG:  Recent Labs Lab 09/29/15 2134 09/30/15 0013 09/30/15 0335 09/30/15 0743 09/30/15 1201  GLUCAP 152* 145* 140* 164* 124*   D-Dimer No results for input(s): DDIMER in the last 72 hours. Hgb A1c No results for input(s): HGBA1C in the last 72 hours. Lipid Profile No results for input(s): CHOL, HDL, LDLCALC, TRIG, CHOLHDL, LDLDIRECT in the last 72  hours. Thyroid function studies No results for input(s): TSH, T4TOTAL, T3FREE, THYROIDAB in the last 72 hours.  Invalid input(s): FREET3 Anemia work up No results for input(s): VITAMINB12, FOLATE, FERRITIN, TIBC, IRON, RETICCTPCT in the last 72 hours. Urinalysis    Component Value Date/Time   COLORURINE YELLOW 09/12/2015 0036   APPEARANCEUR CLEAR 09/12/2015 0036   LABSPEC 1.025 09/12/2015 0036   PHURINE 6.5 09/12/2015 0036   GLUCOSEU NEGATIVE 09/12/2015 0036   HGBUR NEGATIVE 09/12/2015 0036   BILIRUBINUR NEGATIVE 09/12/2015 0036   KETONESUR NEGATIVE 09/12/2015 0036   PROTEINUR 100* 09/12/2015 0036   UROBILINOGEN 0.2 01/27/2015 2308   NITRITE NEGATIVE 09/12/2015 0036   LEUKOCYTESUR NEGATIVE 09/12/2015 0036   Sepsis Labs Invalid input(s): PROCALCITONIN,  WBC,  LACTICIDVEN Microbiology No results found for this or any previous visit (from the  past 240 hour(s)).   Time coordinating discharge: Over 30 minutes  SIGNED:   Calvert Cantor, MD  [facility] 09/30/2015, 1:18 PM Pager   If 7PM-7AM, please contact night-coverage [email or web address] Password [email or web address]

## 2015-09-30 NOTE — Progress Notes (Signed)
Pt discharging home with [facility]. Shannon [name] is aware of discharge today. [facility] to delivery DME today to the home. [facility] will give instruction on operation of the pump and supplies(Osmolite etc.) Daughter is aware and cell number was given to [facility]. Shannon [name] will continue care of pt with HHRN/NA/PT.

## 2015-09-30 NOTE — Progress Notes (Signed)
Physical Therapy Treatment Patient Details Name: Shannon [name] MRN: [id number] DOB: January 22, 1924 Today's Date: 09/30/2015    History of Present Illness 82 yo female admitted with HCAP and Gastic outlet obstruction 2* pyloric stenosis. Hx of HTN, Osteomyelitis of 3rd toe on R foot,  Afib, MI, ventral hernia, CVA, DM, COPD, arhtritis, PVD, neuropathy.    PT Comments    Continuing to participate well with session. Pt states she plans to d/c today.   Follow Up Recommendations  Home health PT;Supervision/Assistance - 24 hour     Equipment Recommendations  None recommended by PT    Recommendations for Other Services       Precautions / Restrictions Precautions Precautions: Fall Precaution Comments: G tube, feeding tube Restrictions Weight Bearing Restrictions: No    Mobility  Bed Mobility               General bed mobility comments: oob in recliner  Transfers Overall transfer level: Needs assistance Equipment used: Rolling walker (2 wheeled) Transfers: Sit to/from Stand Sit to Stand: Min guard         General transfer comment: close guard for safety. Increased time.  Ambulation/Gait Ambulation/Gait assistance: Min guard Ambulation Distance (Feet): 200 Feet Assistive device: Rolling walker (2 wheeled) Gait Pattern/deviations: Step-through pattern;Decreased stride length     General Gait Details: close guard for safety.  slow gait speed. Pt tolerated distance well.    Stairs            Wheelchair Mobility    Modified Rankin (Stroke Patients Only)       Balance                                    Cognition Arousal/Alertness: Awake/alert Behavior During Therapy: WFL for tasks assessed/performed Overall Cognitive Status: Within Functional Limits for tasks assessed                      Exercises      General Comments        Pertinent Vitals/Pain Pain Assessment: Faces Faces Pain Scale: Hurts little more Pain Location:  abdomen, G tube site Pain Descriptors / Indicators: Sore Pain Intervention(s): Monitored during session    Home Living                      Prior Function            PT Goals (current goals can now be found in the care plan section) Progress towards PT goals: Progressing toward goals    Frequency  Min 3X/week    PT Plan Current plan remains appropriate    Co-evaluation             End of Session   Activity Tolerance: Patient tolerated treatment well Patient left: in chair;with call bell/phone within reach;with chair alarm set     Time: 1011-1026 PT Time Calculation (min) (ACUTE ONLY): 15 min  Charges:  $Gait Training: 8-22 mins                    G Codes:      Weston Anna, MPT Pager: [phone]

## 2015-10-01 DIAGNOSIS — Z431 Encounter for attention to gastrostomy: Secondary | ICD-10-CM | POA: Diagnosis not present

## 2015-10-01 DIAGNOSIS — Z48815 Encounter for surgical aftercare following surgery on the digestive system: Secondary | ICD-10-CM | POA: Diagnosis not present

## 2015-10-02 DIAGNOSIS — Z48815 Encounter for surgical aftercare following surgery on the digestive system: Secondary | ICD-10-CM | POA: Diagnosis not present

## 2015-10-02 DIAGNOSIS — Z431 Encounter for attention to gastrostomy: Secondary | ICD-10-CM | POA: Diagnosis not present

## 2015-10-04 DIAGNOSIS — Z48815 Encounter for surgical aftercare following surgery on the digestive system: Secondary | ICD-10-CM | POA: Diagnosis not present

## 2015-10-04 DIAGNOSIS — Z431 Encounter for attention to gastrostomy: Secondary | ICD-10-CM | POA: Diagnosis not present

## 2015-10-06 ENCOUNTER — Telehealth: Payer: Self-pay

## 2015-10-06 ENCOUNTER — Other Ambulatory Visit: Payer: Self-pay | Admitting: Nurse Practitioner

## 2015-10-06 ENCOUNTER — Other Ambulatory Visit: Payer: Self-pay | Admitting: Internal Medicine

## 2015-10-06 ENCOUNTER — Ambulatory Visit (INDEPENDENT_AMBULATORY_CARE_PROVIDER_SITE_OTHER): Payer: Medicare Other | Admitting: Internal Medicine

## 2015-10-06 ENCOUNTER — Encounter: Payer: Self-pay | Admitting: Internal Medicine

## 2015-10-06 VITALS — BP 142/64 | HR 71 | Temp 98.2°F | Ht 66.0 in | Wt 146.0 lb

## 2015-10-06 DIAGNOSIS — E43 Unspecified severe protein-calorie malnutrition: Secondary | ICD-10-CM | POA: Diagnosis not present

## 2015-10-06 DIAGNOSIS — M869 Osteomyelitis, unspecified: Secondary | ICD-10-CM | POA: Diagnosis not present

## 2015-10-06 DIAGNOSIS — R1012 Left upper quadrant pain: Secondary | ICD-10-CM

## 2015-10-06 DIAGNOSIS — Z48815 Encounter for surgical aftercare following surgery on the digestive system: Secondary | ICD-10-CM | POA: Diagnosis not present

## 2015-10-06 DIAGNOSIS — Z980 Intestinal bypass and anastomosis status: Secondary | ICD-10-CM

## 2015-10-06 DIAGNOSIS — K311 Adult hypertrophic pyloric stenosis: Secondary | ICD-10-CM

## 2015-10-06 DIAGNOSIS — E039 Hypothyroidism, unspecified: Secondary | ICD-10-CM

## 2015-10-06 DIAGNOSIS — I10 Essential (primary) hypertension: Secondary | ICD-10-CM

## 2015-10-06 DIAGNOSIS — R600 Localized edema: Secondary | ICD-10-CM | POA: Diagnosis not present

## 2015-10-06 DIAGNOSIS — R1310 Dysphagia, unspecified: Secondary | ICD-10-CM

## 2015-10-06 DIAGNOSIS — E1169 Type 2 diabetes mellitus with other specified complication: Secondary | ICD-10-CM

## 2015-10-06 DIAGNOSIS — Z934 Other artificial openings of gastrointestinal tract status: Secondary | ICD-10-CM

## 2015-10-06 DIAGNOSIS — Z431 Encounter for attention to gastrostomy: Secondary | ICD-10-CM | POA: Diagnosis not present

## 2015-10-06 MED ORDER — TETANUS-DIPHTH-ACELL PERTUSSIS 5-2.5-18.5 LF-MCG/0.5 IM SUSP: 0.5000 mL | Freq: Once | INTRAMUSCULAR | Status: DC

## 2015-10-06 NOTE — Progress Notes (Signed)
Patient ID: Shannon [name], female   DOB: 1923/12/31, 82 y.o.   MRN: [id number]    Location:  [facility] Place of Service: OFFICE  Chief Complaint  Patient presents with  . Follow-up    Hospital Follow up    HPI:  82 yo female seen today for hospital f/u  (09/11/15-6/29th) for gastric outlet obstruction, aspiration pneumonia, acute osteomyelitis right 3rd toe, pyloric stenosis, A/CKD stage 3, afib, HTN, DM with PAD, hypothyroidism, depression, AOCD, severe protein calorie malnutrition, hyperkalemia and pressure ulcer. In early June she was admitted with right 3rd toe osteo but declined amputation. She was d/c'd on 30 days augmentin which completed 7/4th. She had open placement of GJ tube with fluoroscopic manipulation of FT by sx on 6/22 and started on nightly TF (osmolite) with goal rate 50 cc/hr with free water flushes. She was told she could takes her pills orally. Cr 1.37 at d/c. BP meds adjusted due to elevated readings. A1c 6.5%. prostat ordered for albumin 2.5. Hgb 8.4; WBC 9.5K at d/c  Today she c/o increased drainage from GJ tube since d/c from hospital that soaks her clothes. She has residuals of 900-1500c drained from G tube daily. Drainage brownish in color. She also notes severe constant left sided abdominal pain. Tolerating TF at night. No hematemesis. No bloody stools. She is due to f/u with sx later this week for staple removal. No f/c. Poor appetite but is able to drink liquids. She swallows meds with no issues. She is taking more tramadol due to her pain. She is a poor historian due to acuity of pain. Hx obtained from chart and daughter.  She has seasonal allergy sx's and takes zyrtec daily and has flonase. She takes advair for asthma  She does not take any medications for diabetes. She has numbness or tingling in feet. She would like to see podiatry due to foot deformities. She has been going to [place] for foot care.  No seizures since last OV. Takes  gabapentin  HTN/afib/hyperlipidemia - BP controlled on meds. Cholesterol stable on lipitor. Rate controlled on amiodarone. She takes eliquis for anticoagulation  GOO - s/p GJ tube. Takes PPI BID. Constipation stable on lactulose. She gets TF at night. Drinks liquids po. Takes zofran prn  Hyponatremia/ hx SIADH- she was taking salt tabs but over the last several weeks has noticed the entire tab in her stool intermittently. Na 137   DM - controlled. A1c 6.4%. Takes gabapentin for neuropathy  Hx CVA - stable. Takes ASA daily  Depression - mood stable on sertraline  Arthritis - joint pain stable on tramadol and voltaren gel  Hypothyroidism - takes levothyroxine daily   Pressure sore/right 3rd toe osteomyelitis - completed 1 month of augmentin. Followed by podiatry. She has PAD   Past Medical History  Diagnosis Date  . Pneumonia   . Hypertension   . Small bowel obstruction (HCC)     "I don't know how many" (01/11/2015)  . Ventral hernia with bowel obstruction   . Asthma   . Hyperlipidemia   . Stroke (HCC)     "light one"  . COPD (chronic obstructive pulmonary disease) (HCC)   . Hypothyroidism   . Type II diabetes mellitus (HCC)   . Anemia     previous blood transfusions  . GERD (gastroesophageal reflux disease)   . History of stomach ulcers   . Arthritis     "all over"  . Renal insufficiency   . Depression     "light case"  .  SIADH (syndrome of inappropriate ADH production) (HCC)     [name] 01/10/2015  . Perforated gastric ulcer (HCC)   . Gastric stenosis   . Paraesophageal hernia   . Bradycardia     topped BB and amiodarone decreased- junctional rhythm     Past Surgical History  Procedure Laterality Date  . Tubal ligation    . Colectomy    . Hernia repair  2015  . Esophagogastroduodenoscopy N/A 01/19/2014    Procedure: ESOPHAGOGASTRODUODENOSCOPY (EGD);  Surgeon: Hilarie Fredrickson, MD;  Location: [facility];  Service: Endoscopy;  Laterality: N/A;  .  Esophagogastroduodenoscopy N/A 01/20/2014    Procedure: ESOPHAGOGASTRODUODENOSCOPY (EGD);  Surgeon: Hilarie Fredrickson, MD;  Location: [facility];  Service: Endoscopy;  Laterality: N/A;  . Esophagogastroduodenoscopy N/A 03/19/2014    Procedure: ESOPHAGOGASTRODUODENOSCOPY (EGD);  Surgeon: Rachael Fee, MD;  Location: [facility];  Service: Endoscopy;  Laterality: N/A;  . Cholecystectomy open    . Ventral hernia repair  2015    incarcerated ventral hernia ([facility] 09/2013)/notes 01/10/2015  . Cataract extraction w/ intraocular lens  implant, bilateral    . Gastrojejunostomy      hx/notes 01/10/2015  . Laparotomy N/A 01/20/2015    Procedure: EXPLORATORY LAPAROTOMY;  Surgeon: Abigail Miyamoto, MD;  Location[facility];  Service: General;  Laterality: N/A;  . Lysis of adhesion N/A 01/20/2015    Procedure: LYSIS OF ADHESIONS < 1 HOUR;  Surgeon: Abigail Miyamoto, MD;  Location: [facility] OR;  Service: General;  Laterality: N/A;  . Esophagogastroduodenoscopy N/A 07/08/2015    Procedure: ESOPHAGOGASTRODUODENOSCOPY (EGD);  Surgeon: Sherrilyn Rist, MD;  Location: [facility] ENDOSCOPY;  Service: Endoscopy;  Laterality: N/A;  . Esophagogastroduodenoscopy (egd) with propofol N/A 09/15/2015    Procedure: ESOPHAGOGASTRODUODENOSCOPY (EGD) WITH PROPOFOL;  Surgeon: Ruffin Frederick, MD;  Location: [facility] ENDOSCOPY;  Service: Gastroenterology;  Laterality: N/A;  . Gastrojejunostomy N/A 09/23/2015    Procedure: OPEN GASTROJEJUNOSTOMY TUBE PLACEMENT;  Surgeon: De Blanch Kinsinger, MD;  Location: [facility] ORS;  Service: General;  Laterality: N/A;    Patient Care Team: Kirt Boys, DO as PCP - General (Internal Medicine) Jason Coop, MD as Referring Physician (Cardiology) Carole Binning, MD as Referring Physician (Ophthalmology)  Social History   Social History  . Marital Status: Widowed    Spouse Name: N/A  . Number of Children: 3  . Years of Education: N/A   Occupational History  . Not on file.   Social  History Main Topics  . Smoking status: Never Smoker   . Smokeless tobacco: Former Neurosurgeon    Types: Snuff  . Alcohol Use: No  . Drug Use: No  . Sexual Activity: No   Other Topics Concern  . Not on file   Social History Narrative   Married in [place], lives in one story house with 2 people and no pets   Occupation: ?   Has a living Will, [place], and doesn't have DNR   Was living with Husband (in frail health) in [place] until 09/2013.  After her [facility] admission they both came to live with daughter in [place]     reports that she has never smoked. She has quit using smokeless tobacco. Her smokeless tobacco use included Snuff. She reports that she does not drink alcohol or use illicit drugs.  Family History  Problem Relation Age of Onset  . Stroke Mother   . Heart attack Neg Hx   . Hypertension Mother   . Diabetes Brother    Family Status  Relation  Status Death Age  . Mother Deceased   . Father Deceased   . Maternal Grandmother Deceased   . Maternal Grandfather Deceased   . Paternal Grandmother Deceased   . Paternal Grandfather Deceased      No Known Allergies  Medications: Patient's Medications  New Prescriptions   No medications on file  Previous Medications   ACETAMINOPHEN (TYLENOL) 325 MG TABLET    Take 325 mg by mouth every 6 (six) hours as needed for mild pain or fever.    ADVAIR DISKUS 250-50 MCG/DOSE AEPB    INHALE 1 PUFF INTO THE LUNGS 2 (TWO) TIMES DAILY AS NEEDED FOR SHORTNESS OF BREATH   ALBUTEROL (PROVENTIL HFA;VENTOLIN HFA) 108 (90 BASE) MCG/ACT INHALER    Inhale 2 puffs into the lungs every 6 (six) hours as needed for wheezing or shortness of breath.   AMINO ACIDS-PROTEIN HYDROLYS (FEEDING SUPPLEMENT, PRO-STAT SUGAR FREE 64,) LIQD    Place 30 mLs into feeding tube 2 (two) times daily.   AMIODARONE (PACERONE) 100 MG TABLET    Take 1 tablet (100 mg total) by mouth daily.   AMLODIPINE (NORVASC) 10 MG TABLET    TAKE 1 TABLET BY MOUTH DAILY FOR HYPERTENSION    AMOXICILLIN-CLAVULANATE (AUGMENTIN) 500-125 MG TABLET    Take 1 tablet (500 mg total) by mouth 2 (two) times daily.   APIXABAN (ELIQUIS) 2.5 MG TABS TABLET    Take 1 tablet (2.5 mg total) by mouth 2 (two) times daily.   ATORVASTATIN (LIPITOR) 10 MG TABLET    Take one tablet by mouth once daily   B COMPLEX VITAMINS TABLET    Take 1 tablet by mouth daily.    BISACODYL (DULCOLAX) 10 MG SUPPOSITORY    Place 1 suppository (10 mg total) rectally daily as needed for moderate constipation.   CALCIUM-VITAMIN D (OSCAL WITH D) 500-200 MG-UNIT PER TABLET    Take 1 tablet by mouth daily with breakfast.    CHLORHEXIDINE (PERIDEX) 0.12 % SOLUTION    Use as directed 15 mLs in the mouth or throat 2 (two) times daily.   [employer] GAS RELIEF 80 MG CHEWABLE TABLET    CHEW 1 TABLET BY MOUTH 4 TIMES A DAY AS NEEDED FOR FLATULENCE   DICLOFENAC SODIUM (VOLTAREN) 1 % GEL    Apply 2 g topically daily as needed (for pain).    FEEDING SUPPLEMENT (BOOST / RESOURCE BREEZE) LIQD    Take 1 Container by mouth 3 (three) times daily between meals.   FLUTICASONE (FLONASE) 50 MCG/ACT NASAL SPRAY    Place 2 sprays into both nostrils daily.   GABAPENTIN (NEURONTIN) 300 MG CAPSULE    Take 2 caps po in AM and qhs, take 1 cap daily at NOON   HYDRALAZINE (APRESOLINE) 50 MG TABLET    Take 1 tablet (50 mg total) by mouth 4 (four) times daily.   ISOSORBIDE MONONITRATE (IMDUR) 30 MG 24 HR TABLET    TAKE 1 TABLET (30 MG TOTAL) BY MOUTH DAILY.   LABETALOL (NORMODYNE) 100 MG TABLET    Take 1 tablet (100 mg total) by mouth 2 (two) times daily.   LACTULOSE (CHRONULAC) 10 GM/15ML SOLUTION    Take 15 mLs (10 g total) by mouth daily as needed for mild constipation.   LATANOPROST (XALATAN) 0.005 % OPHTHALMIC SOLUTION    Place 1 drop into both eyes at bedtime.   LEVOTHYROXINE (SYNTHROID, LEVOTHROID) 88 MCG TABLET    Take 1 tablet (88 mcg total) by mouth daily before breakfast.  NUTRITIONAL SUPPLEMENTS (FEEDING SUPPLEMENT, OSMOLITE 1.5 CAL,) LIQD    Place  1,000 mLs into feeding tube daily.   ONDANSETRON (ZOFRAN) 4 MG TABLET    Take 1 tablet (4 mg total) by mouth every 8 (eight) hours as needed for nausea or vomiting.   PANTOPRAZOLE (PROTONIX) 40 MG TABLET    TAKE 1 TABLET BY MOUTH TWICE A DAY   POLYETHYL GLYCOL-PROPYL GLYCOL 0.4-0.3 % SOLN    Place 1 drop into both eyes 4 (four) times daily.    POLYETHYLENE GLYCOL POWDER (GLYCOLAX/MIRALAX) POWDER    MIX 17GRAMS IN 8 OUNCES OF LIQUID AND DRINK TWICE DAILY AS NEEDED   RESTASIS 0.05 % OPHTHALMIC EMULSION    USE 1 DROP INTO BOTH EYES TWICE DAILY   SERTRALINE (ZOLOFT) 100 MG TABLET    Take one tablet by mouth once daily for depression   SODIUM CHLORIDE 1 G TABLET    TAKE 1 TABLET BY MOUTH THREE TIMES DAILY   TRAMADOL (ULTRAM) 50 MG TABLET    Take 50 mg by mouth 2 (two) times daily as needed for moderate pain.    WATER FOR IRRIGATION, STERILE (FREE WATER) SOLN    Place 60 mLs into feeding tube every 6 (six) hours.  Modified Medications   Modified Medication Previous Medication   TDAP (BOOSTRIX) 5-2.5-18.5 LF-MCG/0.5 INJECTION Tdap (BOOSTRIX) 5-2.5-18.5 LF-MCG/0.5 injection      Inject 0.5 mLs into the muscle once.    Inject 0.5 mLs into the muscle once.  Discontinued Medications   No medications on file    Review of Systems  Unable to perform ROS: Acuity of condition    Filed Vitals:   10/06/15 1045  BP: 142/64  Pulse: 71  Temp: 98.2 F (36.8 C)  TempSrc: Oral  Height: 5\' 6"  (1.676 m)  Weight: 146 lb (66.225 kg)  SpO2: 92%   Body mass index is 23.58 kg/(m^2).  Physical Exam  Constitutional: She appears well-developed.  Frail appearing sitting in chair, moaning and leaning forward  HENT:  Mouth/Throat: Oropharynx is clear and moist. No oropharyngeal exudate.  MMM  Eyes: Pupils are equal, round, and reactive to light. No scleral icterus.  Neck: Neck supple. Carotid bruit is not present. No tracheal deviation present. No thyromegaly present.  Cardiovascular: Normal rate, regular  rhythm and intact distal pulses.  Exam reveals no gallop and no friction rub.   Murmur (1/6 SEM) heard. +1 pitting LE edema, no calf TTP  Pulmonary/Chest: Effort normal and breath sounds normal. No stridor. No respiratory distress. She has no wheezes. She has no rales.  Abdominal: Soft. Bowel sounds are normal. She exhibits distension. She exhibits no mass. There is no hepatomegaly. There is tenderness. There is no rebound and no guarding.    Musculoskeletal: She exhibits edema.  Lymphadenopathy:    She has no cervical adenopathy.  Neurological: She is alert.  Skin: Skin is warm and dry. No rash noted.  Psychiatric: She has a normal mood and affect. Her behavior is normal. Thought content normal.     Labs reviewed: Admission on 09/11/2015, Discharged on 09/30/2015  No results displayed because visit has over 200 results.    Lab on 09/10/2015  Component Date Value Ref Range Status  . Free T4 09/10/2015 0.72* 0.82 - 1.77 ng/dL Final  . TSH 96/29/5284 58.260* 0.450 - 4.500 uIU/mL Final  . Glucose 09/10/2015 111* 65 - 99 mg/dL Final  . BUN 13/24/4010 17  10 - 36 mg/dL Final  . Creatinine, Ser 09/10/2015 1.64* 0.57 -  1.00 mg/dL Final  . GFR calc non Af Amer 09/10/2015 27* >59 mL/min/1.73 Final  . GFR calc Af Amer 09/10/2015 32* >59 mL/min/1.73 Final  . BUN/Creatinine Ratio 09/10/2015 10* 12 - 28 Final  . Sodium 09/10/2015 145* 134 - 144 mmol/L Final  . Potassium 09/10/2015 4.0  3.5 - 5.2 mmol/L Final  . Chloride 09/10/2015 102  96 - 106 mmol/L Final  . CO2 09/10/2015 22  18 - 29 mmol/L Final  . Calcium 09/10/2015 9.8  8.7 - 10.3 mg/dL Final  . WBC 53/66/4403 19.1* 3.4 - 10.8 x10E3/uL Final  . RBC 09/10/2015 3.27* 3.77 - 5.28 x10E6/uL Final  . Hemoglobin 09/10/2015 10.6* 11.1 - 15.9 g/dL Final  . Hematocrit 47/42/5956 32.5* 34.0 - 46.6 % Final  . MCV 09/10/2015 99* 79 - 97 fL Final  . MCH 09/10/2015 32.4  26.6 - 33.0 pg Final  . MCHC 09/10/2015 32.6  31.5 - 35.7 g/dL Final  . RDW  38/75/6433 18.1* 12.3 - 15.4 % Final  . Platelets 09/10/2015 337  150 - 379 x10E3/uL Final  . Neutrophils 09/10/2015 40   Final  . Lymphs 09/10/2015 7   Final  . Monocytes 09/10/2015 4   Final  . Eos 09/10/2015 49   Final  . Basos 09/10/2015 0   Final  . Neutrophils Absolute 09/10/2015 7.7* 1.4 - 7.0 x10E3/uL Final  . Lymphocytes Absolute 09/10/2015 1.3  0.7 - 3.1 x10E3/uL Final  . Monocytes Absolute 09/10/2015 0.8  0.1 - 0.9 x10E3/uL Final  . EOS (ABSOLUTE) 09/10/2015 9.3* 0.0 - 0.4 x10E3/uL Final  . Basophils Absolute 09/10/2015 0.1  0.0 - 0.2 x10E3/uL Final  . Immature Granulocytes 09/10/2015 0   Final  . Immature Grans (Abs) 09/10/2015 0.0  0.0 - 0.1 x10E3/uL Final  . Hematology Comments: 09/10/2015 Note:   Final   Verified by microscopic examination.  Admission on 08/26/2015, Discharged on 09/05/2015  No results displayed because visit has over 200 results.    Office Visit on 08/10/2015  Component Date Value Ref Range Status  . Sodium 08/10/2015 131* 135 - 146 mmol/L Final  . Potassium 08/10/2015 4.4  3.5 - 5.3 mmol/L Final  . Chloride 08/10/2015 96* 98 - 110 mmol/L Final  . CO2 08/10/2015 25  20 - 31 mmol/L Final  . Glucose, Bld 08/10/2015 95  65 - 99 mg/dL Final  . BUN 29/51/8841 27* 7 - 25 mg/dL Final  . Creat 66/09/3014 1.53* 0.60 - 0.88 mg/dL Final  . Total Bilirubin 08/10/2015 0.5  0.2 - 1.2 mg/dL Final  . Alkaline Phosphatase 08/10/2015 50  33 - 130 U/L Final  . AST 08/10/2015 25  10 - 35 U/L Final  . ALT 08/10/2015 19  6 - 29 U/L Final  . Total Protein 08/10/2015 6.7  6.1 - 8.1 g/dL Final  . Albumin 04/11/3233 3.7  3.6 - 5.1 g/dL Final  . Calcium 57/32/2025 9.5  8.6 - 10.4 mg/dL Final  . WBC 42/70/6237 6.3  3.8 - 10.8 K/uL Final  . RBC 08/10/2015 3.37* 3.80 - 5.10 MIL/uL Final  . Hemoglobin 08/10/2015 10.5* 11.7 - 15.5 g/dL Final  . HCT 62/83/1517 31.6* 35.0 - 45.0 % Final  . MCV 08/10/2015 93.8  80.0 - 100.0 fL Final  . MCH 08/10/2015 31.2  27.0 - 33.0 pg Final    . MCHC 08/10/2015 33.2  32.0 - 36.0 g/dL Final  . RDW 61/60/7371 17.2* 11.0 - 15.0 % Final  . Platelets 08/10/2015 314  140 - 400 K/uL  Final  . MPV 08/10/2015 9.8  7.5 - 12.5 fL Final   ** Please note change in unit of measure and reference range(s). **  Admission on 08/02/2015, Discharged on 08/05/2015  Component Date Value Ref Range Status  . Sodium 08/02/2015 131* 135 - 145 mmol/L Final  . Potassium 08/02/2015 5.2* 3.5 - 5.1 mmol/L Final  . Chloride 08/02/2015 98* 101 - 111 mmol/L Final  . BUN 08/02/2015 27* 6 - 20 mg/dL Final  . Creatinine, Ser 08/02/2015 1.90* 0.44 - 1.00 mg/dL Final  . Glucose, Bld 65/78/4696 148* 65 - 99 mg/dL Final  . Calcium, Ion 29/52/8413 1.17  1.13 - 1.30 mmol/L Final  . TCO2 08/02/2015 19  0 - 100 mmol/L Final  . Hemoglobin 08/02/2015 10.9* 12.0 - 15.0 g/dL Final  . HCT 24/40/1027 32.0* 36.0 - 46.0 % Final  . Troponin i, poc 08/02/2015 0.01  0.00 - 0.08 ng/mL Final  . Comment 3 08/02/2015          Final   Comment: Due to the release kinetics of cTnI, a negative result within the first hours of the onset of symptoms does not rule out myocardial infarction with certainty. If myocardial infarction is still suspected, repeat the test at appropriate intervals.   . WBC 08/02/2015 4.9  4.0 - 10.5 K/uL Final  . RBC 08/02/2015 3.10* 3.87 - 5.11 MIL/uL Final  . Hemoglobin 08/02/2015 9.6* 12.0 - 15.0 g/dL Final  . HCT 25/36/6440 28.3* 36.0 - 46.0 % Final  . MCV 08/02/2015 91.3  78.0 - 100.0 fL Final  . MCH 08/02/2015 31.0  26.0 - 34.0 pg Final  . MCHC 08/02/2015 33.9  30.0 - 36.0 g/dL Final  . RDW 34/74/2595 16.9* 11.5 - 15.5 % Final  . Platelets 08/02/2015 363  150 - 400 K/uL Final  . Neutrophils Relative % 08/02/2015 58   Final  . Neutro Abs 08/02/2015 2.8  1.7 - 7.7 K/uL Final  . Lymphocytes Relative 08/02/2015 22   Final  . Lymphs Abs 08/02/2015 1.1  0.7 - 4.0 K/uL Final  . Monocytes Relative 08/02/2015 14   Final  . Monocytes Absolute 08/02/2015 0.7   0.1 - 1.0 K/uL Final  . Eosinophils Relative 08/02/2015 5   Final  . Eosinophils Absolute 08/02/2015 0.3  0.0 - 0.7 K/uL Final  . Basophils Relative 08/02/2015 1   Final  . Basophils Absolute 08/02/2015 0.1  0.0 - 0.1 K/uL Final  . Sodium 08/02/2015 129* 135 - 145 mmol/L Final  . Potassium 08/02/2015 5.2* 3.5 - 5.1 mmol/L Final  . Chloride 08/02/2015 99* 101 - 111 mmol/L Final  . CO2 08/02/2015 20* 22 - 32 mmol/L Final  . Glucose, Bld 08/02/2015 157* 65 - 99 mg/dL Final  . BUN 63/87/5643 26* 6 - 20 mg/dL Final  . Creatinine, Ser 08/02/2015 1.95* 0.44 - 1.00 mg/dL Final  . Calcium 32/95/1884 9.2  8.9 - 10.3 mg/dL Final  . GFR calc non Af Amer 08/02/2015 21* >60 mL/min Final  . GFR calc Af Amer 08/02/2015 25* >60 mL/min Final   Comment: (NOTE) The eGFR has been calculated using the CKD EPI equation. This calculation has not been validated in all clinical situations. eGFR's persistently <60 mL/min signify possible Chronic Kidney Disease.   . Anion gap 08/02/2015 10  5 - 15 Final  . TSH 08/02/2015 83.858* 0.350 - 4.500 uIU/mL Final  . Magnesium 08/02/2015 1.8  1.7 - 2.4 mg/dL Final  . Prothrombin Time 08/02/2015 15.7* 11.6 - 15.2 seconds  Final  . INR 08/02/2015 1.24  0.00 - 1.49 Final  . B Natriuretic Peptide 08/02/2015 731.4* 0.0 - 100.0 pg/mL Final  . Sodium 08/03/2015 132* 135 - 145 mmol/L Final  . Potassium 08/03/2015 5.9* 3.5 - 5.1 mmol/L Final   SPECIMEN HEMOLYZED. HEMOLYSIS MAY AFFECT INTEGRITY OF RESULTS.  [name] Chloride 08/03/2015 101  101 - 111 mmol/L Final  . CO2 08/03/2015 17* 22 - 32 mmol/L Final  . Glucose, Bld 08/03/2015 168* 65 - 99 mg/dL Final  . BUN 84/16/6063 29* 6 - 20 mg/dL Final  . Creatinine, Ser 08/03/2015 2.12* 0.44 - 1.00 mg/dL Final  . Calcium 01/60/1093 9.5  8.9 - 10.3 mg/dL Final  . GFR calc non Af Amer 08/03/2015 19* >60 mL/min Final  . GFR calc Af Amer 08/03/2015 22* >60 mL/min Final   Comment: (NOTE) The eGFR has been calculated using the CKD EPI  equation. This calculation has not been validated in all clinical situations. eGFR's persistently <60 mL/min signify possible Chronic Kidney Disease.   . Anion gap 08/03/2015 14  5 - 15 Final  . MRSA by PCR 08/03/2015 NEGATIVE  NEGATIVE Final   Comment:        The GeneXpert MRSA Assay (FDA approved for NASAL specimens only), is one component of a comprehensive MRSA colonization surveillance program. It is not intended to diagnose MRSA infection nor to guide or monitor treatment for MRSA infections.   . Sodium 08/03/2015 128* 135 - 145 mmol/L Final  . Potassium 08/03/2015 5.0  3.5 - 5.1 mmol/L Final  . Chloride 08/03/2015 97* 101 - 111 mmol/L Final  . CO2 08/03/2015 21* 22 - 32 mmol/L Final  . Glucose, Bld 08/03/2015 162* 65 - 99 mg/dL Final  . BUN 23/55/7322 30* 6 - 20 mg/dL Final  . Creatinine, Ser 08/03/2015 2.20* 0.44 - 1.00 mg/dL Final  . Calcium 02/54/2706 9.3  8.9 - 10.3 mg/dL Final  . GFR calc non Af Amer 08/03/2015 19* >60 mL/min Final  . GFR calc Af Amer 08/03/2015 21* >60 mL/min Final   Comment: (NOTE) The eGFR has been calculated using the CKD EPI equation. This calculation has not been validated in all clinical situations. eGFR's persistently <60 mL/min signify possible Chronic Kidney Disease.   . Anion gap 08/03/2015 10  5 - 15 Final  . Sodium 08/04/2015 128* 135 - 145 mmol/L Final  . Potassium 08/04/2015 5.0  3.5 - 5.1 mmol/L Final  . Chloride 08/04/2015 98* 101 - 111 mmol/L Final  . CO2 08/04/2015 21* 22 - 32 mmol/L Final  . Glucose, Bld 08/04/2015 171* 65 - 99 mg/dL Final  . BUN 23/76/2831 36* 6 - 20 mg/dL Final  . Creatinine, Ser 08/04/2015 2.35* 0.44 - 1.00 mg/dL Final  . Calcium 51/76/1607 9.2  8.9 - 10.3 mg/dL Final  . GFR calc non Af Amer 08/04/2015 17* >60 mL/min Final  . GFR calc Af Amer 08/04/2015 20* >60 mL/min Final   Comment: (NOTE) The eGFR has been calculated using the CKD EPI equation. This calculation has not been validated in all clinical  situations. eGFR's persistently <60 mL/min signify possible Chronic Kidney Disease.   . Anion gap 08/04/2015 9  5 - 15 Final  . Sodium 08/05/2015 132* 135 - 145 mmol/L Final  . Potassium 08/05/2015 4.1  3.5 - 5.1 mmol/L Final   DELTA CHECK NOTED  . Chloride 08/05/2015 100* 101 - 111 mmol/L Final  . CO2 08/05/2015 21* 22 - 32 mmol/L Final  . Glucose, Bld 08/05/2015  131* 65 - 99 mg/dL Final  . BUN 16/01/9603 30* 6 - 20 mg/dL Final  . Creatinine, Ser 08/05/2015 1.63* 0.44 - 1.00 mg/dL Final  . Calcium 54/12/8117 9.0  8.9 - 10.3 mg/dL Final  . GFR calc non Af Amer 08/05/2015 27* >60 mL/min Final  . GFR calc Af Amer 08/05/2015 31* >60 mL/min Final   Comment: (NOTE) The eGFR has been calculated using the CKD EPI equation. This calculation has not been validated in all clinical situations. eGFR's persistently <60 mL/min signify possible Chronic Kidney Disease.   . Anion gap 08/05/2015 11  5 - 15 Final  Office Visit on 07/23/2015  Component Date Value Ref Range Status  . Sodium 07/23/2015 135  135 - 146 mmol/L Final  . Potassium 07/23/2015 4.8  3.5 - 5.3 mmol/L Final  . Chloride 07/23/2015 105  98 - 110 mmol/L Final  . CO2 07/23/2015 22  20 - 31 mmol/L Final  . Glucose, Bld 07/23/2015 123* 65 - 99 mg/dL Final  . BUN 14/78/2956 16  7 - 25 mg/dL Final  . Creat 21/30/8657 1.37* 0.60 - 0.88 mg/dL Final  . Calcium 84/69/6295 9.1  8.6 - 10.4 mg/dL Final  . WBC 28/41/3244 5.2  3.8 - 10.8 K/uL Final  . RBC 07/23/2015 3.25* 3.80 - 5.10 MIL/uL Final  . Hemoglobin 07/23/2015 10.2* 11.7 - 15.5 g/dL Final  . HCT 04/05/7251 31.1* 35.0 - 45.0 % Final  . MCV 07/23/2015 95.7  80.0 - 100.0 fL Final  . MCH 07/23/2015 31.4  27.0 - 33.0 pg Final  . MCHC 07/23/2015 32.8  32.0 - 36.0 g/dL Final  . RDW 66/44/0347 16.9* 11.0 - 15.0 % Final  . Platelets 07/23/2015 285  140 - 400 K/uL Final  . MPV 07/23/2015 10.7  7.5 - 12.5 fL Final   ** Please note change in unit of measure and reference range(s). **    Office Visit on 07/14/2015  Component Date Value Ref Range Status  . WBC 07/14/2015 6.1  3.4 - 10.8 x10E3/uL Final  . RBC 07/14/2015 3.47* 3.77 - 5.28 x10E6/uL Final  . Hemoglobin 07/14/2015 10.9* 11.1 - 15.9 g/dL Final  . Hematocrit 42/59/5638 32.4* 34.0 - 46.6 % Final  . MCV 07/14/2015 93  79 - 97 fL Final  . MCH 07/14/2015 31.4  26.6 - 33.0 pg Final  . MCHC 07/14/2015 33.6  31.5 - 35.7 g/dL Final  . RDW 75/64/3329 16.5* 12.3 - 15.4 % Final  . Neutrophils 07/14/2015 65   Final  . Lymphs 07/14/2015 21   Final  . Monocytes 07/14/2015 10   Final  . Eos 07/14/2015 3   Final  . Basos 07/14/2015 0   Final  . Neutrophils Absolute 07/14/2015 4.0  1.4 - 7.0 x10E3/uL Final  . Lymphocytes Absolute 07/14/2015 1.3  0.7 - 3.1 x10E3/uL Final  . Monocytes Absolute 07/14/2015 0.6  0.1 - 0.9 x10E3/uL Final  . EOS (ABSOLUTE) 07/14/2015 0.2  0.0 - 0.4 x10E3/uL Final  . Basophils Absolute 07/14/2015 0.0  0.0 - 0.2 x10E3/uL Final  . Immature Granulocytes 07/14/2015 1   Final  . Immature Grans (Abs) 07/14/2015 0.0  0.0 - 0.1 x10E3/uL Final  . Glucose 07/14/2015 132* 65 - 99 mg/dL Final  . BUN 51/88/4166 27  10 - 36 mg/dL Final  . Creatinine, Ser 07/14/2015 1.24* 0.57 - 1.00 mg/dL Final  . GFR calc non Af Amer 07/14/2015 38* >59 mL/min/1.73 Final  . GFR calc Af Amer 07/14/2015 44* >59 mL/min/1.73 Final  .  BUN/Creatinine Ratio 07/14/2015 22  12 - 28 Final  . Sodium 07/14/2015 140  134 - 144 mmol/L Final  . Potassium 07/14/2015 4.5  3.5 - 5.2 mmol/L Final  . Chloride 07/14/2015 101  96 - 106 mmol/L Final  . CO2 07/14/2015 24  18 - 29 mmol/L Final  . Calcium 07/14/2015 9.4  8.7 - 10.3 mg/dL Final  . Total Protein 07/14/2015 5.8* 6.0 - 8.5 g/dL Final  . Albumin 82/95/6213 3.7  3.2 - 4.6 g/dL Final  . Globulin, Total 07/14/2015 2.1  1.5 - 4.5 g/dL Final  . Albumin/Globulin Ratio 07/14/2015 1.8  1.2 - 2.2 Final  . Bilirubin Total 07/14/2015 0.3  0.0 - 1.2 mg/dL Final  . Alkaline Phosphatase 07/14/2015 60   39 - 117 IU/L Final  . AST 07/14/2015 19  0 - 40 IU/L Final  . ALT 07/14/2015 16  0 - 32 IU/L Final  . TSH 07/14/2015 4.260  0.450 - 4.500 uIU/mL Final  Admission on 07/05/2015, Discharged on 07/11/2015  No results displayed because visit has over 200 results.      Dg Chest 2 View  09/11/2015  CLINICAL DATA:  62-year-old female with shortness of breath and tachypnea EXAM: CHEST  2 VIEW COMPARISON:  Chest radiograph dated 08/26/2015 FINDINGS: Two views of the chest demonstrate a small left pleural effusion, new from prior study. There is associated partial compressive atelectasis of the left lung base. Pneumonia is not excluded. The right lung is clear. There is no pneumothorax. Top-normal cardiac silhouette. There is degenerative changes of the shoulders. No acute osseous pathology. IMPRESSION: Small left pleural effusion with left lung base atelectasis versus infiltrate. Electronically Signed   By: Elgie Collard M.D.   On: 09/11/2015 23:56   Dg Abd 1 View  09/23/2015  CLINICAL DATA:  Gastrostomy tube placement. EXAM: ABDOMEN - 1 VIEW; DG C-ARM 1-60 MIN-NO REPORT COMPARISON:  09/22/2015 upper GI. FINDINGS: Fluoroscopy time 0.5 minutes. Single nondiagnostic spot fluoroscopic intraoperative radiograph demonstrates gastrostomy tube tip in the third portion of duodenum. IMPRESSION: Intraoperative fluoroscopic guidance for gastrostomy tube placement. Electronically Signed   By: Delbert Phenix M.D.   On: 09/23/2015 13:36   Ct Abdomen Pelvis W Contrast  09/12/2015  CLINICAL DATA:  82 year old female with abdominal pain. History of small-bowel obstruction. EXAM: CT ABDOMEN AND PELVIS WITH CONTRAST TECHNIQUE: Multidetector CT imaging of the abdomen and pelvis was performed using the standard protocol following bolus administration of intravenous contrast. CONTRAST:  80mL ISOVUE-300 IOPAMIDOL (ISOVUE-300) INJECTION 61% COMPARISON:  CT dated 08/26/2015 is FINDINGS: There is a partially visualized small left  pleural effusion. Patchy areas of airspace density at the lung bases as well as multiple ground-glass and nodular density at the left lung base are concerning for pneumonia. Clinical correlation is recommended. There is coronary vascular calcification. No intra-abdominal free air.  No free fluid. Cholecystectomy. Multiple small scattered hepatic hypodense lesions of measuring up to 9 mm are not well characterized but likely represent cysts or hemangioma. Cholecystectomy. The pancreas, and the adrenal glands appear unremarkable. Subcentimeter splenic hypodense lesion is not characterized but likely represents a cyst or hemangioma. There is no hydronephrosis on either side. The visualized ureters appear grossly unremarkable. The urinary bladder is partially distended. There is mild haziness of the bladder wall. Correlation with urinalysis recommended to exclude cystitis. The uterus is grossly unremarkable. There is mild thickened appearance of the distal esophagus which may represent esophagitis or esophageal irritation related to reflux. The stomach is distended with gastric contents,  likely related to the reported chronically impaired gastric emptying. There is thickened appearance of the gastric antrum. There is mild haziness of the proximal duodenum concerning for duodenitis. There is postsurgical changes of bowel with duodenojejunal anastomosis. There is no evidence of small-bowel obstruction. There are scattered sigmoid and colonic diverticula without active inflammatory changes. There is postsurgical changes of partial sigmoid resection with anastomotic suture. There is advanced aortoiliac atherosclerotic disease. The origins of the celiac axis, SMA, and IMA appear patent. No portal venous gas identified. There is no adenopathy. There is midline vertical anterior pelvic wall incisional scar. There is a 5 cm defect in the anterior peritoneal wall along the surgical incision with protrusion of a short segment of  the colon. A small fat containing left para incisional hernia noted. There is diffuse stranding of the subcutaneous fat. No fluid collection or hematoma. There is osteopenia with multilevel degenerative changes of the spine. T11 compression deformity similar to prior study. No acute fracture. IMPRESSION: Small left pleural effusion with findings concerning for bibasilar pneumonia. Clinical correlation and follow-up recommended. Severe gastric distention likely related to chronic gastric dysmotility or partial gastric outlet obstruction. Clinical correlation is recommended. There has been interval increase in the gastric distension compared the prior CT. Mild haziness of the proximal duodenum concerning for duodenitis. No evidence of small-bowel obstruction. Esophagitis. Colonic diverticulosis without active inflammatory changes. Electronically Signed   By: Elgie Collard M.D.   On: 09/12/2015 00:33   US Renal  09/25/2015  CLINICAL DATA:  Acute kidney insufficiency EXAM: RENAL / URINARY TRACT ULTRASOUND COMPLETE COMPARISON:  CT scan 09/11/2015 FINDINGS: Right Kidney: Length: 10.1 cm. There is cortical increased echogenicity suspicious for medical renal disease. No hydronephrosis. No shadowing renal calculi. No renal mass. Left Kidney: Length: 10.2 cm. Cortical increased echogenicity suspicious for medical renal disease. No hydronephrosis. No renal calculi. No renal mass. Bladder: Urinary bladder is not visualized. Small abdominal ascites. Right pleural effusion. IMPRESSION: 1. Bilateral renal mild increased echogenicity suspicious for medical renal disease. Bilateral no hydronephrosis or renal calculi. Urinary bladder is not visualized. Small abdominal ascites. Right pleural effusion. Electronically Signed   By: Natasha Mead M.D.   On: 09/25/2015 13:54   Dg Abd Portable 1v  09/12/2015  CLINICAL DATA:  Nausea. EXAM: PORTABLE ABDOMEN - 1 VIEW COMPARISON:  CT scan from yesterday FINDINGS: There is a paucity of  bowel gas which limits evaluation. No evidence of small bowel obstruction on this study. The stomach may still be distended with debris. Contrast is seen in the left renal collecting system from yesterday's CT scan. No other acute abnormalities. IMPRESSION: No small bowel obstruction identified. The stomach is not as well evaluated on this study but may still be distended with debris. Electronically Signed   By: Gerome Sam III M.D   On: 09/12/2015 07:07   Dg [name] W/water Sol Cm  09/22/2015  CLINICAL DATA:  Gastric outlet obstruction, pyloric stenosis EXAM: WATER SOLUBLE UPPER GI SERIES TECHNIQUE: Single-column upper GI series was performed using water soluble contrast. CONTRAST:  60 mL cc of water-soluble contrast COMPARISON:  CT 09/11/2015 FLUOROSCOPY TIME:  Radiation Exposure Index (as provided by the fluoroscopic device): 33.7 mGy If the device does not provide the exposure index: Fluoroscopy Time (in minutes and seconds): Number of Acquired Images: FINDINGS: 60 cc of water-soluble contrast and 50 cc of water was hand injected into the stomach via the NG tube. The contrast fills the stomach which is nondistended. Persistent narrowing at the gastric pylorus.  Small trickle of contrast does pass through the duodenum. IMPRESSION: No evidence of high-grade obstruction. Stenosis through the pyloric region of stomach. Electronically Signed   By: Genevive Bi M.D.   On: 09/22/2015 13:17   Dg C-arm 1-60 Min-no Report  09/23/2015  CLINICAL DATA: surgery C-ARM 1-60 MINUTES Fluoroscopy was utilized by the requesting physician.  No radiographic interpretation.     Assessment/Plan   ICD-9-CM ICD-10-CM   1. Gastric outlet obstruction 537.0 K31.1 CBC with Differential/Platelets     CMP     Magnesium     DG Cm Inj Any Colonic Tube W/Fluoro  2. Left upper quadrant pain 789.02 R10.12 CBC with Differential/Platelets     CMP     DG Cm Inj Any Colonic Tube W/Fluoro  3. Diabetic osteomyelitis (HCC) 250.80  E11.69    731.8 M86.9    730.20     Right 3rd toe  4. Edema of both legs 782.3 R60.0   5. Essential hypertension, benign 401.1 I10   6. Protein-calorie malnutrition, severe 262 E43   7. Gastrojejunostomy tube status due to #1 V45.3 Z98.0 Magnesium  8. Hypothyroidism, unspecified hypothyroidism type 244.9 E03.9 TSH    communicated with surgery office - will send pt for gastrograffin study under fluoroscopy for G tube placement  F/u with surgery as scheduled  Continue current meds as ordered  GJ tube care as indicated   HH to follow  TF qhs as ordered  F/u in 1 month to re-eval   Nicolaas Savo S. Ancil Linsey  [facility] [address] [place], [place] [address] [phone] Cell (Monday-Friday 8 AM - 5 PM) [phone] After 5 PM and follow prompts

## 2015-10-06 NOTE — Telephone Encounter (Signed)
Spoke with patients daughter to advise her of her appointment to [facility] at 8:30 for precdure advised her to not eat or take any meds 6 hours before procedure. But to Bring meds with her to appointment if she needs them after procedure.

## 2015-10-07 ENCOUNTER — Ambulatory Visit (HOSPITAL_COMMUNITY)
Admission: RE | Admit: 2015-10-07 | Discharge: 2015-10-07 | Disposition: A | Discharge status: Home / Self Care | Payer: Medicare Other | Source: Ambulatory Visit | Attending: Internal Medicine | Admitting: Internal Medicine

## 2015-10-07 DIAGNOSIS — K9423 Gastrostomy malfunction: Secondary | ICD-10-CM | POA: Diagnosis not present

## 2015-10-07 DIAGNOSIS — Z434 Encounter for attention to other artificial openings of digestive tract: Secondary | ICD-10-CM | POA: Diagnosis not present

## 2015-10-07 DIAGNOSIS — R1310 Dysphagia, unspecified: Secondary | ICD-10-CM

## 2015-10-07 DIAGNOSIS — Z8719 Personal history of other diseases of the digestive system: Secondary | ICD-10-CM | POA: Diagnosis not present

## 2015-10-07 LAB — COMPREHENSIVE METABOLIC PANEL
ALT: 12 IU/L (ref 0–32)
AST: 19 IU/L (ref 0–40)
Albumin/Globulin Ratio: 1 — ABNORMAL LOW (ref 1.2–2.2)
Albumin: 3.5 g/dL (ref 3.2–4.6)
Alkaline Phosphatase: 47 IU/L (ref 39–117)
BUN/Creatinine Ratio: 15 (ref 12–28)
BUN: 25 mg/dL (ref 10–36)
Bilirubin Total: 0.2 mg/dL (ref 0.0–1.2)
CO2: 26 mmol/L (ref 18–29)
Calcium: 9.4 mg/dL (ref 8.7–10.3)
Chloride: 96 mmol/L (ref 96–106)
Creatinine, Ser: 1.66 mg/dL — ABNORMAL HIGH (ref 0.57–1.00)
GFR calc Af Amer: 31 mL/min/{1.73_m2} — ABNORMAL LOW (ref >59)
GFR calc non Af Amer: 27 mL/min/{1.73_m2} — ABNORMAL LOW (ref >59)
Globulin, Total: 3.6 g/dL (ref 1.5–4.5)
Glucose: 94 mg/dL (ref 65–99)
Potassium: 4.8 mmol/L (ref 3.5–5.2)
Sodium: 135 mmol/L (ref 134–144)
Total Protein: 7.1 g/dL (ref 6.0–8.5)

## 2015-10-07 LAB — MAGNESIUM: Magnesium: 1.8 mg/dL (ref 1.6–2.3)

## 2015-10-07 LAB — CBC WITH DIFFERENTIAL/PLATELET
Basophils Absolute: 0.1 10*3/uL (ref 0.0–0.2)
Basos: 1 %
EOS (ABSOLUTE): 2.4 10*3/uL — ABNORMAL HIGH (ref 0.0–0.4)
Eos: 30 %
Hematocrit: 24.4 % — ABNORMAL LOW (ref 34.0–46.6)
Hemoglobin: 8 g/dL — ABNORMAL LOW (ref 11.1–15.9)
Immature Grans (Abs): 0 10*3/uL (ref 0.0–0.1)
Immature Granulocytes: 0 %
Lymphocytes Absolute: 1.2 10*3/uL (ref 0.7–3.1)
Lymphs: 15 %
MCH: 32.7 pg (ref 26.6–33.0)
MCHC: 32.8 g/dL (ref 31.5–35.7)
MCV: 100 fL — ABNORMAL HIGH (ref 79–97)
Monocytes Absolute: 0.4 10*3/uL (ref 0.1–0.9)
Monocytes: 5 %
Neutrophils Absolute: 3.8 10*3/uL (ref 1.4–7.0)
Neutrophils: 49 %
Platelets: 331 10*3/uL (ref 150–379)
RBC: 2.45 x10E6/uL — CL (ref 3.77–5.28)
RDW: 16.5 % — ABNORMAL HIGH (ref 12.3–15.4)
WBC: 7.9 10*3/uL (ref 3.4–10.8)

## 2015-10-07 LAB — TSH: TSH: 81.69 u[IU]/mL — ABNORMAL HIGH (ref 0.450–4.500)

## 2015-10-07 MED ORDER — IOPAMIDOL (ISOVUE-300) INJECTION 61%
INTRAVENOUS | Status: AC
  Administered 2015-10-07: 20 mL
  Filled 2015-10-07: qty 50

## 2015-10-11 ENCOUNTER — Other Ambulatory Visit: Payer: Self-pay

## 2015-10-11 DIAGNOSIS — Z48815 Encounter for surgical aftercare following surgery on the digestive system: Secondary | ICD-10-CM | POA: Diagnosis not present

## 2015-10-11 DIAGNOSIS — Z431 Encounter for attention to gastrostomy: Secondary | ICD-10-CM | POA: Diagnosis not present

## 2015-10-11 MED ORDER — LEVOTHYROXINE SODIUM 100 MCG PO TABS: 100.0000 ug | ORAL_TABLET | Freq: Every day | ORAL | Status: DC

## 2015-10-12 DIAGNOSIS — Z48815 Encounter for surgical aftercare following surgery on the digestive system: Secondary | ICD-10-CM | POA: Diagnosis not present

## 2015-10-12 DIAGNOSIS — Z431 Encounter for attention to gastrostomy: Secondary | ICD-10-CM | POA: Diagnosis not present

## 2015-10-13 DIAGNOSIS — Z48815 Encounter for surgical aftercare following surgery on the digestive system: Secondary | ICD-10-CM | POA: Diagnosis not present

## 2015-10-13 DIAGNOSIS — Z431 Encounter for attention to gastrostomy: Secondary | ICD-10-CM | POA: Diagnosis not present

## 2015-10-18 DIAGNOSIS — Z431 Encounter for attention to gastrostomy: Secondary | ICD-10-CM | POA: Diagnosis not present

## 2015-10-18 DIAGNOSIS — Z48815 Encounter for surgical aftercare following surgery on the digestive system: Secondary | ICD-10-CM | POA: Diagnosis not present

## 2015-10-19 ENCOUNTER — Telehealth: Payer: Self-pay | Admitting: *Deleted

## 2015-10-19 DIAGNOSIS — Z431 Encounter for attention to gastrostomy: Secondary | ICD-10-CM | POA: Diagnosis not present

## 2015-10-19 DIAGNOSIS — Z48815 Encounter for surgical aftercare following surgery on the digestive system: Secondary | ICD-10-CM | POA: Diagnosis not present

## 2015-10-19 MED ORDER — NYSTATIN 100000 UNIT/GM EX POWD: Freq: Two times a day (BID) | CUTANEOUS | Status: DC

## 2015-10-19 NOTE — Telephone Encounter (Signed)
Crystal with [facility] called and stated that patient has a rash under her breasts and would like to know if you would call her in some Nystatin Powder to help prevent yeast. [facility]. Please Advise.

## 2015-10-19 NOTE — Telephone Encounter (Signed)
No need to RF abx. According to d/c summary she was only supposed to take it for 1 month (completed 7/4th)

## 2015-10-19 NOTE — Telephone Encounter (Signed)
[name] notified and agreed.  °

## 2015-10-19 NOTE — Telephone Encounter (Signed)
Shannon [name], caregiver called and stated that patient had received antibiotics in the hospital and she took them but she stated that it has a refill on it and she wants to make sure she is not suppose to take that to. She stated she was concerned with the resistance. Finished out the bottle of antibiotics of Amoxicillin on Sunday. Stated it has been more than a month since she has been taking them. Please Advise.

## 2015-10-19 NOTE — Telephone Encounter (Signed)
Ok for nystatin powder BID to rash x 4 weeks. No RF

## 2015-10-19 NOTE — Telephone Encounter (Signed)
Notified and faxed Rx to pharmacy.

## 2015-10-21 DIAGNOSIS — Z431 Encounter for attention to gastrostomy: Secondary | ICD-10-CM | POA: Diagnosis not present

## 2015-10-21 DIAGNOSIS — Z48815 Encounter for surgical aftercare following surgery on the digestive system: Secondary | ICD-10-CM | POA: Diagnosis not present

## 2015-10-25 ENCOUNTER — Telehealth: Payer: Self-pay | Admitting: *Deleted

## 2015-10-25 DIAGNOSIS — Z431 Encounter for attention to gastrostomy: Secondary | ICD-10-CM | POA: Diagnosis not present

## 2015-10-25 DIAGNOSIS — Z48815 Encounter for surgical aftercare following surgery on the digestive system: Secondary | ICD-10-CM | POA: Diagnosis not present

## 2015-10-25 NOTE — Telephone Encounter (Signed)
Mel with [facility] called and wanted verbal orders for HHPT  2x3wks. Verbal orders given.

## 2015-10-26 ENCOUNTER — Other Ambulatory Visit: Payer: Self-pay

## 2015-10-26 DIAGNOSIS — Z48815 Encounter for surgical aftercare following surgery on the digestive system: Secondary | ICD-10-CM | POA: Diagnosis not present

## 2015-10-26 DIAGNOSIS — E039 Hypothyroidism, unspecified: Secondary | ICD-10-CM

## 2015-10-26 DIAGNOSIS — Z431 Encounter for attention to gastrostomy: Secondary | ICD-10-CM | POA: Diagnosis not present

## 2015-10-27 ENCOUNTER — Encounter: Payer: Self-pay | Admitting: Internal Medicine

## 2015-10-27 ENCOUNTER — Ambulatory Visit (INDEPENDENT_AMBULATORY_CARE_PROVIDER_SITE_OTHER): Payer: Medicare Other | Admitting: Internal Medicine

## 2015-10-27 ENCOUNTER — Ambulatory Visit
Admission: RE | Admit: 2015-10-27 | Discharge: 2015-10-27 | Disposition: A | Discharge status: Home / Self Care | Payer: Medicare Other | Source: Ambulatory Visit | Attending: Internal Medicine | Admitting: Internal Medicine

## 2015-10-27 VITALS — BP 139/68 | HR 59 | Temp 97.5°F | Ht 66.0 in | Wt 154.8 lb

## 2015-10-27 DIAGNOSIS — R05 Cough: Secondary | ICD-10-CM | POA: Diagnosis not present

## 2015-10-27 DIAGNOSIS — H9201 Otalgia, right ear: Secondary | ICD-10-CM | POA: Diagnosis not present

## 2015-10-27 DIAGNOSIS — E43 Unspecified severe protein-calorie malnutrition: Secondary | ICD-10-CM | POA: Diagnosis not present

## 2015-10-27 DIAGNOSIS — R0689 Other abnormalities of breathing: Secondary | ICD-10-CM

## 2015-10-27 DIAGNOSIS — R0600 Dyspnea, unspecified: Secondary | ICD-10-CM | POA: Diagnosis not present

## 2015-10-27 DIAGNOSIS — I10 Essential (primary) hypertension: Secondary | ICD-10-CM | POA: Diagnosis not present

## 2015-10-27 DIAGNOSIS — Z980 Intestinal bypass and anastomosis status: Secondary | ICD-10-CM | POA: Diagnosis not present

## 2015-10-27 DIAGNOSIS — J45909 Unspecified asthma, uncomplicated: Secondary | ICD-10-CM

## 2015-10-27 DIAGNOSIS — Z934 Other artificial openings of gastrointestinal tract status: Secondary | ICD-10-CM

## 2015-10-27 DIAGNOSIS — I2510 Atherosclerotic heart disease of native coronary artery without angina pectoris: Secondary | ICD-10-CM

## 2015-10-27 DIAGNOSIS — R0989 Other specified symptoms and signs involving the circulatory and respiratory systems: Secondary | ICD-10-CM | POA: Diagnosis not present

## 2015-10-27 DIAGNOSIS — R600 Localized edema: Secondary | ICD-10-CM

## 2015-10-27 MED ORDER — METHYLPREDNISOLONE 4 MG PO TBPK: ORAL_TABLET | ORAL | 0 refills | Status: DC

## 2015-10-27 MED ORDER — AMOXICILLIN-POT CLAVULANATE 250-62.5 MG/5ML PO SUSR: 875.0000 mg | Freq: Two times a day (BID) | ORAL | 0 refills | Status: DC

## 2015-10-27 MED ORDER — IPRATROPIUM-ALBUTEROL 0.5-2.5 (3) MG/3ML IN SOLN
3.0000 mL | Freq: Once | RESPIRATORY_TRACT | Status: AC
  Administered 2015-10-27: 3 mL via RESPIRATORY_TRACT

## 2015-10-27 NOTE — Patient Instructions (Addendum)
Push fluids and rest  Take all of Augmentin as ordered. May administer med through Peg tube if need be  Take probiotic daily while on Augmentin or eat yogurt daily  Will call with xray results  Continue other medications as ordered  Follow up as scheduled

## 2015-10-27 NOTE — Progress Notes (Signed)
Patient ID: Shannon [name], female   DOB: [birth year], 82 y.o.   MRN: [id number]    Location:  [facility] Place of Service: OFFICE  Chief Complaint  Patient presents with  . Shortness of Breath    patient states she has some chest congestion, Here with her daughter  . Abdominal Pain    hurts with movement the site where she had operation, Choking feeling when eating  . Fatigue    Yesterday patient fell in seat and stated that she felt like she was going to pass out  . Ear Pain    right ear pain stated that she used sweet oil yesterday    HPI:  82 yo female seen today for acute visit. She has several concerns. She reports difficulty breathing, dysphagia, GJ tube site pain, lightheadedness and right ear pain. She is taking zyrtec, advair and prn albuterol HFA. She uses HFA <2 times per week. She has intermittent wheezing. Increasing DOE over the last week with associated SOB at rest, occasional cough and chest congestion. No fever/chills. She underwent IR procedure to readjust GJ  Tube and has not had any residuals since then. Tolerating TF at night. She has occasional nausea with emesis. She takes zofran prn. She is beginning to have issues swallowing her pills and at this time only her vitamins are an issue. No trouble with rx meds  She has seasonal allergy sx's and takes zyrtec daily and has flonase. She takes advair for asthma  She does not take any medications for diabetes. She has numbness or tingling in feet. She would like to see podiatry due to foot deformities. She has been going to [facility] for foot care.  No seizures since last OV. Takes gabapentin  HTN/afib/hyperlipidemia - BP controlled on meds. Cholesterol stable on lipitor. Rate controlled on amiodarone. She takes eliquis for anticoagulation  GOO - s/p GJ tube. Takes PPI BID. Constipation stable on lactulose. She gets TF at night. Drinks liquids po. Takes zofran prn  Hyponatremia/ hx SIADH- she was taking salt tabs but over the  last several weeks has noticed the entire tab in her stool intermittently. Na 137   DM - controlled. A1c 6.4%. Takes gabapentin for neuropathy  Hx CVA - stable. Takes ASA daily  Depression - mood stable on sertraline  Arthritis - joint pain stable on tramadol and voltaren gel  Hypothyroidism - takes levothyroxine daily   Pressure sore/right 3rd toe osteomyelitis - completed 1 month of augmentin. Followed by podiatry. She has PAD   Past Medical History:  Diagnosis Date  . Anemia    previous blood transfusions  . Arthritis    "all over"  . Asthma   . Bradycardia    topped BB and amiodarone decreased- junctional rhythm   . COPD (chronic obstructive pulmonary disease) (HCC)   . Depression    "light case"  . Gastric stenosis   . GERD (gastroesophageal reflux disease)   . History of stomach ulcers   . Hyperlipidemia   . Hypertension   . Hypothyroidism   . Paraesophageal hernia   . Perforated gastric ulcer (HCC)   . Pneumonia   . Renal insufficiency   . SIADH (syndrome of inappropriate ADH production) (HCC)    [name] 01/10/2015  . Small bowel obstruction (HCC)    "I don't know how many" (01/11/2015)  . Stroke (HCC)    "light one"  . Type II diabetes mellitus (HCC)   . Ventral hernia with bowel obstruction     Past  Surgical History:  Procedure Laterality Date  . CATARACT EXTRACTION W/ INTRAOCULAR LENS  IMPLANT, BILATERAL    . CHOLECYSTECTOMY OPEN    . COLECTOMY    . ESOPHAGOGASTRODUODENOSCOPY N/A 01/19/2014   Procedure: ESOPHAGOGASTRODUODENOSCOPY (EGD);  Surgeon: Hilarie Fredrickson, MD;  Location: [facility];  Service: Endoscopy;  Laterality: N/A;  . ESOPHAGOGASTRODUODENOSCOPY N/A 01/20/2014   Procedure: ESOPHAGOGASTRODUODENOSCOPY (EGD);  Surgeon: Hilarie Fredrickson, MD;  Location: [facility];  Service: Endoscopy;  Laterality: N/A;  . ESOPHAGOGASTRODUODENOSCOPY N/A 03/19/2014   Procedure: ESOPHAGOGASTRODUODENOSCOPY (EGD);  Surgeon: Rachael Fee, MD;  Location: [facility]  ENDOSCOPY;  Service: Endoscopy;  Laterality: N/A;  . ESOPHAGOGASTRODUODENOSCOPY N/A 07/08/2015   Procedure: ESOPHAGOGASTRODUODENOSCOPY (EGD);  Surgeon: Sherrilyn Rist, MD;  Location: [facility] ENDOSCOPY;  Service: Endoscopy;  Laterality: N/A;  . ESOPHAGOGASTRODUODENOSCOPY (EGD) WITH PROPOFOL N/A 09/15/2015   Procedure: ESOPHAGOGASTRODUODENOSCOPY (EGD) WITH PROPOFOL;  Surgeon: Ruffin Frederick, MD;  Location: [facility] ENDOSCOPY;  Service: Gastroenterology;  Laterality: N/A;  . GASTROJEJUNOSTOMY     hx/notes 01/10/2015  . GASTROJEJUNOSTOMY N/A 09/23/2015   Procedure: OPEN GASTROJEJUNOSTOMY TUBE PLACEMENT;  Surgeon: De Blanch Kinsinger, MD;  Location: [facility] ORS;  Service: General;  Laterality: N/A;  . HERNIA REPAIR  2015  . LAPAROTOMY N/A 01/20/2015   Procedure: EXPLORATORY LAPAROTOMY;  Surgeon: Abigail Miyamoto, MD;  Location: [facility];  Service: General;  Laterality: N/A;  . LYSIS OF ADHESION N/A 01/20/2015   Procedure: LYSIS OF ADHESIONS < 1 HOUR;  Surgeon: Abigail Miyamoto, MD;  Location: [facility] OR;  Service: General;  Laterality: N/A;  . TUBAL LIGATION    . VENTRAL HERNIA REPAIR  2015   incarcerated ventral hernia ([facility] 09/2013)/notes 01/10/2015    Patient Care Team: Kirt Boys, DO as PCP - General (Internal Medicine) Jason Coop, MD as Referring Physician (Cardiology) Carole Binning, MD as Referring Physician (Ophthalmology)  Social History   Social History  . Marital status: Widowed    Spouse name: N/A  . Number of children: 3  . Years of education: N/A   Occupational History  . Not on file.   Social History Main Topics  . Smoking status: Never Smoker  . Smokeless tobacco: Former Neurosurgeon    Types: Snuff  . Alcohol use No  . Drug use: No  . Sexual activity: No   Other Topics Concern  . Not on file   Social History Narrative   Married in [place], lives in one story house with 2 people and no pets   Occupation: ?   Has a living Will, [place], and doesn't have DNR   Was living  with Husband (in frail health) in [place] until 09/2013.  After her [facility] admission they both came to live with daughter in [place]     reports that she has never smoked. She has quit using smokeless tobacco. Her smokeless tobacco use included Snuff. She reports that she does not drink alcohol or use drugs.  Family History  Problem Relation Age of Onset  . Stroke Mother   . Hypertension Mother   . Diabetes Brother   . Heart attack Neg Hx    Family Status  Relation Status  . Mother Deceased  . Father Deceased  . Maternal Grandmother Deceased  . Maternal Grandfather Deceased  . Paternal Grandmother Deceased  . Paternal Grandfather Deceased  . Brother   . Neg Hx      No Known Allergies  Medications: Patient's Medications  New Prescriptions   No medications on file  Previous Medications  ACETAMINOPHEN (TYLENOL) 325 MG TABLET    Take 325 mg by mouth every 6 (six) hours as needed for mild pain or fever.    ADVAIR DISKUS 250-50 MCG/DOSE AEPB    INHALE 1 PUFF INTO THE LUNGS 2 (TWO) TIMES DAILY AS NEEDED FOR SHORTNESS OF BREATH   ALBUTEROL (PROVENTIL HFA;VENTOLIN HFA) 108 (90 BASE) MCG/ACT INHALER    Inhale 2 puffs into the lungs every 6 (six) hours as needed for wheezing or shortness of breath.   AMINO ACIDS-PROTEIN HYDROLYS (FEEDING SUPPLEMENT, PRO-STAT SUGAR FREE 64,) LIQD    Place 30 mLs into feeding tube 2 (two) times daily.   AMIODARONE (PACERONE) 100 MG TABLET    Take 1 tablet (100 mg total) by mouth daily.   AMLODIPINE (NORVASC) 10 MG TABLET    TAKE 1 TABLET BY MOUTH DAILY FOR HYPERTENSION   APIXABAN (ELIQUIS) 2.5 MG TABS TABLET    Take 1 tablet (2.5 mg total) by mouth 2 (two) times daily.   ATORVASTATIN (LIPITOR) 10 MG TABLET    Take one tablet by mouth once daily   B COMPLEX VITAMINS TABLET    Take 1 tablet by mouth daily.    BISACODYL (DULCOLAX) 10 MG SUPPOSITORY    Place 1 suppository (10 mg total) rectally daily as needed for moderate constipation.    CALCIUM-VITAMIN D (OSCAL WITH D) 500-200 MG-UNIT PER TABLET    Take 1 tablet by mouth daily with breakfast.    CHLORHEXIDINE (PERIDEX) 0.12 % SOLUTION    Use as directed 15 mLs in the mouth or throat 2 (two) times daily.   [employer] GAS RELIEF 80 MG CHEWABLE TABLET    CHEW 1 TABLET BY MOUTH 4 TIMES A DAY AS NEEDED FOR FLATULENCE   DICLOFENAC SODIUM (VOLTAREN) 1 % GEL    Apply 2 g topically daily as needed (for pain).    FEEDING SUPPLEMENT (BOOST / RESOURCE BREEZE) LIQD    Take 1 Container by mouth 3 (three) times daily between meals.   FLUTICASONE (FLONASE) 50 MCG/ACT NASAL SPRAY    Place 2 sprays into both nostrils daily.   GABAPENTIN (NEURONTIN) 300 MG CAPSULE    Take 2 caps po in AM and qhs, take 1 cap daily at NOON   HYDRALAZINE (APRESOLINE) 50 MG TABLET    Take 1 tablet (50 mg total) by mouth 4 (four) times daily.   ISOSORBIDE MONONITRATE (IMDUR) 30 MG 24 HR TABLET    TAKE 1 TABLET (30 MG TOTAL) BY MOUTH DAILY.   LABETALOL (NORMODYNE) 100 MG TABLET    Take 1 tablet (100 mg total) by mouth 2 (two) times daily.   LACTULOSE (CHRONULAC) 10 GM/15ML SOLUTION    Take 15 mLs (10 g total) by mouth daily as needed for mild constipation.   LATANOPROST (XALATAN) 0.005 % OPHTHALMIC SOLUTION    Place 1 drop into both eyes at bedtime.   LEVOTHYROXINE (SYNTHROID, LEVOTHROID) 100 MCG TABLET    Take 1 tablet (100 mcg total) by mouth daily before breakfast.   NUTRITIONAL SUPPLEMENTS (FEEDING SUPPLEMENT, OSMOLITE 1.5 CAL,) LIQD    Place 1,000 mLs into feeding tube daily.   NYSTATIN (MYCOSTATIN/NYSTOP) POWDER    Apply topically 2 (two) times daily. To rash   ONDANSETRON (ZOFRAN) 4 MG TABLET    Take 1 tablet (4 mg total) by mouth every 8 (eight) hours as needed for nausea or vomiting.   PANTOPRAZOLE (PROTONIX) 40 MG TABLET    TAKE 1 TABLET BY MOUTH TWICE A DAY   POLYETHYL GLYCOL-PROPYL  GLYCOL 0.4-0.3 % SOLN    Place 1 drop into both eyes 4 (four) times daily.    POLYETHYLENE GLYCOL POWDER (GLYCOLAX/MIRALAX) POWDER    MIX  17GRAMS IN 8 OUNCES OF LIQUID AND DRINK TWICE DAILY AS NEEDED   RESTASIS 0.05 % OPHTHALMIC EMULSION    USE 1 DROP INTO BOTH EYES TWICE DAILY   SERTRALINE (ZOLOFT) 100 MG TABLET    Take one tablet by mouth once daily for depression   SODIUM CHLORIDE 1 G TABLET    TAKE 1 TABLET BY MOUTH THREE TIMES DAILY   TDAP (BOOSTRIX) 5-2.5-18.5 LF-MCG/0.5 INJECTION    Inject 0.5 mLs into the muscle once.   TRAMADOL (ULTRAM) 50 MG TABLET    Take 50 mg by mouth 2 (two) times daily as needed for moderate pain.    WATER FOR IRRIGATION, STERILE (FREE WATER) SOLN    Place 60 mLs into feeding tube every 6 (six) hours.  Modified Medications   No medications on file  Discontinued Medications   AMOXICILLIN-CLAVULANATE (AUGMENTIN) 500-125 MG TABLET    Take 1 tablet (500 mg total) by mouth 2 (two) times daily.    Review of Systems  Unable to perform ROS: Other  pt with conversational dyspnea  Vitals:   10/27/15 1440  BP: 139/68  Pulse: (!) 59  Temp: 97.5 F (36.4 C)  TempSrc: Oral  SpO2: 97%  Weight: 154 lb 12.8 oz (70.2 kg)  Height: 5\' 6"  (1.676 m)   Body mass index is 24.99 kg/m.  Physical Exam  Constitutional: She is oriented to person, place, and time. She appears well-developed.  Frail appearing, sitting upright in chair, min conversational dyspnea. Daughter present  HENT:  Mouth/Throat: No oropharyngeal exudate.  TMs appear intact and nml; no external ear canal swelling; MMdry; no tongue lesions; no thrush appreciated; oropharynx clear  Eyes: Pupils are equal, round, and reactive to light. No scleral icterus.  Neck: Neck supple. Carotid bruit is not present. No tracheal deviation present. No thyromegaly present.  Cardiovascular: Normal rate, regular rhythm and intact distal pulses.  Exam reveals no gallop and no friction rub.   Murmur (1/6 SEM) heard. +2 pitting LE edema b/l. No calf TTP; loss of hair on leg b/l due to swelling  Pulmonary/Chest: Effort normal. No stridor. No respiratory  distress. She has wheezes (b/l end expiratory with prolonged expiratory phase). She has rales (L>R inspiratory/expiratory at base).  Abdominal: Soft. Bowel sounds are normal. She exhibits no distension and no mass. There is no hepatomegaly. There is tenderness (along midline surgical incision but no wound dehiscence or d/c). There is no rebound and no guarding.  GJ tube intact with no signs of redness or d/c at insertion site; NT.  Musculoskeletal: She exhibits edema.  Lymphadenopathy:    She has no cervical adenopathy.  Neurological: She is alert and oriented to person, place, and time. Gait (uses rolling walker) abnormal.  Skin: Skin is warm and dry. No rash noted.  Psychiatric: She has a normal mood and affect. Her behavior is normal. Thought content normal.     Labs reviewed: Office Visit on 10/06/2015  Component Date Value Ref Range Status  . WBC 10/07/2015 7.9  3.4 - 10.8 x10E3/uL Final  . RBC 10/07/2015 2.45* 3.77 - 5.28 x10E6/uL Final  . Hemoglobin 10/07/2015 8.0* 11.1 - 15.9 g/dL Final  . Hematocrit 91/47/8295 24.4* 34.0 - 46.6 % Final  . MCV 10/07/2015 100* 79 - 97 fL Final  . MCH 10/07/2015 32.7  26.6 - 33.0  pg Final  . MCHC 10/07/2015 32.8  31.5 - 35.7 g/dL Final  . RDW 19/14/7829 16.5* 12.3 - 15.4 % Final  . Platelets 10/07/2015 331  150 - 379 x10E3/uL Final  . Neutrophils 10/07/2015 49  % Final  . Lymphs 10/07/2015 15  % Final  . Monocytes 10/07/2015 5  % Final  . Eos 10/07/2015 30  % Final  . Basos 10/07/2015 1  % Final  . Neutrophils Absolute 10/07/2015 3.8  1.4 - 7.0 x10E3/uL Final  . Lymphocytes Absolute 10/07/2015 1.2  0.7 - 3.1 x10E3/uL Final  . Monocytes Absolute 10/07/2015 0.4  0.1 - 0.9 x10E3/uL Final  . EOS (ABSOLUTE) 10/07/2015 2.4* 0.0 - 0.4 x10E3/uL Final  . Basophils Absolute 10/07/2015 0.1  0.0 - 0.2 x10E3/uL Final  . Immature Granulocytes 10/07/2015 0  % Final  . Immature Grans (Abs) 10/07/2015 0.0  0.0 - 0.1 x10E3/uL Final  . Hematology Comments:  10/07/2015 Note:   Final  . Glucose 10/07/2015 94  65 - 99 mg/dL Final  . BUN 56/21/3086 25  10 - 36 mg/dL Final  . Creatinine, Ser 10/07/2015 1.66* 0.57 - 1.00 mg/dL Final  . GFR calc non Af Amer 10/07/2015 27* >59 mL/min/1.73 Final  . GFR calc Af Amer 10/07/2015 31* >59 mL/min/1.73 Final  . BUN/Creatinine Ratio 10/07/2015 15  12 - 28 Final  . Sodium 10/07/2015 135  134 - 144 mmol/L Final  . Potassium 10/07/2015 4.8  3.5 - 5.2 mmol/L Final  . Chloride 10/07/2015 96  96 - 106 mmol/L Final  . CO2 10/07/2015 26  18 - 29 mmol/L Final  . Calcium 10/07/2015 9.4  8.7 - 10.3 mg/dL Final  . Total Protein 10/07/2015 7.1  6.0 - 8.5 g/dL Final  . Albumin 57/84/6962 3.5  3.2 - 4.6 g/dL Final  . Globulin, Total 10/07/2015 3.6  1.5 - 4.5 g/dL Final  . Albumin/Globulin Ratio 10/07/2015 1.0* 1.2 - 2.2 Final  . Bilirubin Total 10/07/2015 0.2  0.0 - 1.2 mg/dL Final  . Alkaline Phosphatase 10/07/2015 47  39 - 117 IU/L Final  . AST 10/07/2015 19  0 - 40 IU/L Final  . ALT 10/07/2015 12  0 - 32 IU/L Final  . Magnesium 10/07/2015 1.8  1.6 - 2.3 mg/dL Final  . TSH 95/28/4132 81.690* 0.450 - 4.500 uIU/mL Final  Admission on 09/11/2015, Discharged on 09/30/2015  No results displayed because visit has over 200 results.    Lab on 09/10/2015  Component Date Value Ref Range Status  . Free T4 09/11/2015 0.72* 0.82 - 1.77 ng/dL Final  . TSH 44/04/270 58.260* 0.450 - 4.500 uIU/mL Final  . Glucose 09/11/2015 111* 65 - 99 mg/dL Final  . BUN 53/66/4403 17  10 - 36 mg/dL Final  . Creatinine, Ser 09/11/2015 1.64* 0.57 - 1.00 mg/dL Final  . GFR calc non Af Amer 09/11/2015 27* >59 mL/min/1.73 Final  . GFR calc Af Amer 09/11/2015 32* >59 mL/min/1.73 Final  . BUN/Creatinine Ratio 09/11/2015 10* 12 - 28 Final  . Sodium 09/11/2015 145* 134 - 144 mmol/L Final  . Potassium 09/11/2015 4.0  3.5 - 5.2 mmol/L Final  . Chloride 09/11/2015 102  96 - 106 mmol/L Final  . CO2 09/11/2015 22  18 - 29 mmol/L Final  . Calcium  09/11/2015 9.8  8.7 - 10.3 mg/dL Final  . WBC 47/42/5956 19.1* 3.4 - 10.8 x10E3/uL Final  . RBC 09/11/2015 3.27* 3.77 - 5.28 x10E6/uL Final  . Hemoglobin 09/11/2015 10.6* 11.1 - 15.9 g/dL Final  .  Hematocrit 09/11/2015 32.5* 34.0 - 46.6 % Final  . MCV 09/11/2015 99* 79 - 97 fL Final  . MCH 09/11/2015 32.4  26.6 - 33.0 pg Final  . MCHC 09/11/2015 32.6  31.5 - 35.7 g/dL Final  . RDW 29/56/2130 18.1* 12.3 - 15.4 % Final  . Platelets 09/11/2015 337  150 - 379 x10E3/uL Final  . Neutrophils 09/11/2015 40  % Final  . Lymphs 09/11/2015 7  % Final  . Monocytes 09/11/2015 4  % Final  . Eos 09/11/2015 49  % Final  . Basos 09/11/2015 0  % Final  . Neutrophils Absolute 09/11/2015 7.7* 1.4 - 7.0 x10E3/uL Final  . Lymphocytes Absolute 09/11/2015 1.3  0.7 - 3.1 x10E3/uL Final  . Monocytes Absolute 09/11/2015 0.8  0.1 - 0.9 x10E3/uL Final  . EOS (ABSOLUTE) 09/11/2015 9.3* 0.0 - 0.4 x10E3/uL Final  . Basophils Absolute 09/11/2015 0.1  0.0 - 0.2 x10E3/uL Final  . Immature Granulocytes 09/11/2015 0  % Final  . Immature Grans (Abs) 09/11/2015 0.0  0.0 - 0.1 x10E3/uL Final  . Hematology Comments: 09/11/2015 Note:   Final  Admission on 08/26/2015, Discharged on 09/05/2015  No results displayed because visit has over 200 results.    Office Visit on 08/10/2015  Component Date Value Ref Range Status  . Sodium 08/10/2015 131* 135 - 146 mmol/L Final  . Potassium 08/10/2015 4.4  3.5 - 5.3 mmol/L Final  . Chloride 08/10/2015 96* 98 - 110 mmol/L Final  . CO2 08/10/2015 25  20 - 31 mmol/L Final  . Glucose, Bld 08/10/2015 95  65 - 99 mg/dL Final  . BUN 86/57/8469 27* 7 - 25 mg/dL Final  . Creat 62/95/2841 1.53* 0.60 - 0.88 mg/dL Final  . Total Bilirubin 08/10/2015 0.5  0.2 - 1.2 mg/dL Final  . Alkaline Phosphatase 08/10/2015 50  33 - 130 U/L Final  . AST 08/10/2015 25  10 - 35 U/L Final  . ALT 08/10/2015 19  6 - 29 U/L Final  . Total Protein 08/10/2015 6.7  6.1 - 8.1 g/dL Final  . Albumin 32/44/0102 3.7  3.6  - 5.1 g/dL Final  . Calcium 72/53/6644 9.5  8.6 - 10.4 mg/dL Final  . WBC 03/47/4259 6.3  3.8 - 10.8 K/uL Final  . RBC 08/10/2015 3.37* 3.80 - 5.10 MIL/uL Final  . Hemoglobin 08/10/2015 10.5* 11.7 - 15.5 g/dL Final  . HCT 56/38/7564 31.6* 35.0 - 45.0 % Final  . MCV 08/10/2015 93.8  80.0 - 100.0 fL Final  . MCH 08/10/2015 31.2  27.0 - 33.0 pg Final  . MCHC 08/10/2015 33.2  32.0 - 36.0 g/dL Final  . RDW 33/29/5188 17.2* 11.0 - 15.0 % Final  . Platelets 08/10/2015 314  140 - 400 K/uL Final  . MPV 08/10/2015 9.8  7.5 - 12.5 fL Final  Admission on 08/02/2015, Discharged on 08/05/2015  Component Date Value Ref Range Status  . Sodium 08/02/2015 131* 135 - 145 mmol/L Final  . Potassium 08/02/2015 5.2* 3.5 - 5.1 mmol/L Final  . Chloride 08/02/2015 98* 101 - 111 mmol/L Final  . BUN 08/02/2015 27* 6 - 20 mg/dL Final  . Creatinine, Ser 08/02/2015 1.90* 0.44 - 1.00 mg/dL Final  . Glucose, Bld 41/66/0630 148* 65 - 99 mg/dL Final  . Calcium, Ion 16/04/930 1.17  1.13 - 1.30 mmol/L Final  . TCO2 08/02/2015 19  0 - 100 mmol/L Final  . Hemoglobin 08/02/2015 10.9* 12.0 - 15.0 g/dL Final  . HCT 35/57/3220 32.0* 36.0 - 46.0 %  Final  . Troponin i, poc 08/02/2015 0.01  0.00 - 0.08 ng/mL Final  . Comment 3 08/02/2015          Final   Comment: Due to the release kinetics of cTnI, a negative result within the first hours of the onset of symptoms does not rule out myocardial infarction with certainty. If myocardial infarction is still suspected, repeat the test at appropriate intervals.   . WBC 08/02/2015 4.9  4.0 - 10.5 K/uL Final  . RBC 08/02/2015 3.10* 3.87 - 5.11 MIL/uL Final  . Hemoglobin 08/02/2015 9.6* 12.0 - 15.0 g/dL Final  . HCT 56/21/3086 28.3* 36.0 - 46.0 % Final  . MCV 08/02/2015 91.3  78.0 - 100.0 fL Final  . MCH 08/02/2015 31.0  26.0 - 34.0 pg Final  . MCHC 08/02/2015 33.9  30.0 - 36.0 g/dL Final  . RDW 57/84/6962 16.9* 11.5 - 15.5 % Final  . Platelets 08/02/2015 363  150 - 400 K/uL  Final  . Neutrophils Relative % 08/02/2015 58  % Final  . Neutro Abs 08/02/2015 2.8  1.7 - 7.7 K/uL Final  . Lymphocytes Relative 08/02/2015 22  % Final  . Lymphs Abs 08/02/2015 1.1  0.7 - 4.0 K/uL Final  . Monocytes Relative 08/02/2015 14  % Final  . Monocytes Absolute 08/02/2015 0.7  0.1 - 1.0 K/uL Final  . Eosinophils Relative 08/02/2015 5  % Final  . Eosinophils Absolute 08/02/2015 0.3  0.0 - 0.7 K/uL Final  . Basophils Relative 08/02/2015 1  % Final  . Basophils Absolute 08/02/2015 0.1  0.0 - 0.1 K/uL Final  . Sodium 08/02/2015 129* 135 - 145 mmol/L Final  . Potassium 08/02/2015 5.2* 3.5 - 5.1 mmol/L Final  . Chloride 08/02/2015 99* 101 - 111 mmol/L Final  . CO2 08/02/2015 20* 22 - 32 mmol/L Final  . Glucose, Bld 08/02/2015 157* 65 - 99 mg/dL Final  . BUN 95/28/4132 26* 6 - 20 mg/dL Final  . Creatinine, Ser 08/02/2015 1.95* 0.44 - 1.00 mg/dL Final  . Calcium 44/04/270 9.2  8.9 - 10.3 mg/dL Final  . GFR calc non Af Amer 08/02/2015 21* >60 mL/min Final  . GFR calc Af Amer 08/02/2015 25* >60 mL/min Final   Comment: (NOTE) The eGFR has been calculated using the CKD EPI equation. This calculation has not been validated in all clinical situations. eGFR's persistently <60 mL/min signify possible Chronic Kidney Disease.   . Anion gap 08/02/2015 10  5 - 15 Final  . TSH 08/02/2015 83.858* 0.350 - 4.500 uIU/mL Final  . Magnesium 08/02/2015 1.8  1.7 - 2.4 mg/dL Final  . Prothrombin Time 08/02/2015 15.7* 11.6 - 15.2 seconds Final  . INR 08/02/2015 1.24  0.00 - 1.49 Final  . B Natriuretic Peptide 08/02/2015 731.4* 0.0 - 100.0 pg/mL Final  . Sodium 08/03/2015 132* 135 - 145 mmol/L Final  . Potassium 08/03/2015 5.9* 3.5 - 5.1 mmol/L Final  . Chloride 08/03/2015 101  101 - 111 mmol/L Final  . CO2 08/03/2015 17* 22 - 32 mmol/L Final  . Glucose, Bld 08/03/2015 168* 65 - 99 mg/dL Final  . BUN 53/66/4403 29* 6 - 20 mg/dL Final  . Creatinine, Ser 08/03/2015 2.12* 0.44 - 1.00 mg/dL Final  .  Calcium 47/42/5956 9.5  8.9 - 10.3 mg/dL Final  . GFR calc non Af Amer 08/03/2015 19* >60 mL/min Final  . GFR calc Af Amer 08/03/2015 22* >60 mL/min Final   Comment: (NOTE) The eGFR has been calculated using the CKD EPI  equation. This calculation has not been validated in all clinical situations. eGFR's persistently <60 mL/min signify possible Chronic Kidney Disease.   . Anion gap 08/03/2015 14  5 - 15 Final  . MRSA by PCR 08/03/2015 NEGATIVE  NEGATIVE Final   Comment:        The GeneXpert MRSA Assay (FDA approved for NASAL specimens only), is one component of a comprehensive MRSA colonization surveillance program. It is not intended to diagnose MRSA infection nor to guide or monitor treatment for MRSA infections.   . Sodium 08/03/2015 128* 135 - 145 mmol/L Final  . Potassium 08/03/2015 5.0  3.5 - 5.1 mmol/L Final  . Chloride 08/03/2015 97* 101 - 111 mmol/L Final  . CO2 08/03/2015 21* 22 - 32 mmol/L Final  . Glucose, Bld 08/03/2015 162* 65 - 99 mg/dL Final  . BUN 04/05/7251 30* 6 - 20 mg/dL Final  . Creatinine, Ser 08/03/2015 2.20* 0.44 - 1.00 mg/dL Final  . Calcium 66/44/0347 9.3  8.9 - 10.3 mg/dL Final  . GFR calc non Af Amer 08/03/2015 19* >60 mL/min Final  . GFR calc Af Amer 08/03/2015 21* >60 mL/min Final   Comment: (NOTE) The eGFR has been calculated using the CKD EPI equation. This calculation has not been validated in all clinical situations. eGFR's persistently <60 mL/min signify possible Chronic Kidney Disease.   . Anion gap 08/03/2015 10  5 - 15 Final  . Sodium 08/04/2015 128* 135 - 145 mmol/L Final  . Potassium 08/04/2015 5.0  3.5 - 5.1 mmol/L Final  . Chloride 08/04/2015 98* 101 - 111 mmol/L Final  . CO2 08/04/2015 21* 22 - 32 mmol/L Final  . Glucose, Bld 08/04/2015 171* 65 - 99 mg/dL Final  . BUN 42/59/5638 36* 6 - 20 mg/dL Final  . Creatinine, Ser 08/04/2015 2.35* 0.44 - 1.00 mg/dL Final  . Calcium 75/64/3329 9.2  8.9 - 10.3 mg/dL Final  . GFR calc non Af  Amer 08/04/2015 17* >60 mL/min Final  . GFR calc Af Amer 08/04/2015 20* >60 mL/min Final   Comment: (NOTE) The eGFR has been calculated using the CKD EPI equation. This calculation has not been validated in all clinical situations. eGFR's persistently <60 mL/min signify possible Chronic Kidney Disease.   . Anion gap 08/04/2015 9  5 - 15 Final  . Sodium 08/05/2015 132* 135 - 145 mmol/L Final  . Potassium 08/05/2015 4.1  3.5 - 5.1 mmol/L Final  . Chloride 08/05/2015 100* 101 - 111 mmol/L Final  . CO2 08/05/2015 21* 22 - 32 mmol/L Final  . Glucose, Bld 08/05/2015 131* 65 - 99 mg/dL Final  . BUN 51/88/4166 30* 6 - 20 mg/dL Final  . Creatinine, Ser 08/05/2015 1.63* 0.44 - 1.00 mg/dL Final  . Calcium 10/01/1599 9.0  8.9 - 10.3 mg/dL Final  . GFR calc non Af Amer 08/05/2015 27* >60 mL/min Final  . GFR calc Af Amer 08/05/2015 31* >60 mL/min Final   Comment: (NOTE) The eGFR has been calculated using the CKD EPI equation. This calculation has not been validated in all clinical situations. eGFR's persistently <60 mL/min signify possible Chronic Kidney Disease.   . Anion gap 08/05/2015 11  5 - 15 Final    Ir Cm Inj Any Colonic Tube W/fluoro  Result Date: 10/07/2015 INDICATION: History of gastric outlet obstruction. Recent open gastrojejunostomy tube placement. Leakage at the skin site. EXAM: GI TUBE INJECTION MEDICATIONS: None ANESTHESIA/SEDATION: None CONTRAST:  20mL ISOVUE-300 IOPAMIDOL (ISOVUE-300) INJECTION 61% - administered into the gastric lumen. FLUOROSCOPY  TIME:  Fluoroscopy Time: 16 seconds 10 (0.9 mGy). COMPLICATIONS: None immediate. PROCEDURE: Patient was placed on the fluoroscopic table. The gastric lumen and jejunal lumen of the tube were injected with contrast and then flushed. The gastrostomy tube retention disc was loose. The gastrostomy tube was pulled back in order to cinch the disc down to the skin. FINDINGS: GJ tube is appropriately positioned. Tip of the jejunal tube is near  the ligament Treitz. The jejunal catheter is mildly coiled in the stomach. IMPRESSION: GJ tube is appropriately positioned and functioning properly. Leakage at the skin is most likely related to loosening of the retention disc. The retention disc was tightened to the skin. Electronically Signed   By: Richarda Overlie M.D.   On: 10/07/2015 12:10     Assessment/Plan    ICD-9-CM ICD-10-CM   1. Dyspnea 786.09 R06.00 DG Chest 2 View     ipratropium-albuterol (DUONEB) 0.5-2.5 (3) MG/3ML nebulizer solution 3 mL   r/o pneumonia  2. Asthma with allergic rhinitis, unspecified asthma severity, uncomplicated 493.00 J45.909   3. Acute otalgia, right 388.70 H92.01   4. Edema of both legs 782.3 R60.0   5. Protein-calorie malnutrition, severe 262 E43   6. Gastrojejunostomy tube status V45.3 Z98.0   7. Essential hypertension, benign 401.1 I10   8. Abnormal breath sounds 786.7 R09.89     Push fluids and rest  Take all of Augmentin as ordered (17.5 ml po or via GJ tube q12hr until bottle empty). May administer med through Peg tube if need be  Take probiotic daily while on Augmentin or eat yogurt daily  Will call with xray results  Continue other medications as ordered  Follow up as scheduled   Maeryn Mcgath S. Ancil Linsey  [facility] [address] [place], [place] [address] [phone] Cell (Monday-Friday 8 AM - 5 PM) [phone] After 5 PM and follow prompts

## 2015-10-29 DIAGNOSIS — Z48815 Encounter for surgical aftercare following surgery on the digestive system: Secondary | ICD-10-CM | POA: Diagnosis not present

## 2015-10-29 DIAGNOSIS — Z431 Encounter for attention to gastrostomy: Secondary | ICD-10-CM | POA: Diagnosis not present

## 2015-11-01 ENCOUNTER — Other Ambulatory Visit: Payer: Self-pay | Admitting: Internal Medicine

## 2015-11-02 DIAGNOSIS — Z431 Encounter for attention to gastrostomy: Secondary | ICD-10-CM | POA: Diagnosis not present

## 2015-11-02 DIAGNOSIS — Z48815 Encounter for surgical aftercare following surgery on the digestive system: Secondary | ICD-10-CM | POA: Diagnosis not present

## 2015-11-03 ENCOUNTER — Telehealth: Payer: Self-pay | Admitting: *Deleted

## 2015-11-03 NOTE — Telephone Encounter (Signed)
Received fax form from [facility] or Prior authorization for Restasis. Printed Face sheet and last OV note and attached and given to Dr. Eulas Post to review. To be faxed back to Fax#: [phone]

## 2015-11-04 DIAGNOSIS — K432 Incisional hernia without obstruction or gangrene: Secondary | ICD-10-CM | POA: Diagnosis not present

## 2015-11-04 NOTE — Telephone Encounter (Signed)
Opened in error

## 2015-11-04 NOTE — Telephone Encounter (Signed)
Per Dr.Carter, patient's eye doctor needs to complete PA for Restasis. I called and left message with Mr.Armstrong to have [name] (patient's daughter) return call when available

## 2015-11-04 NOTE — Telephone Encounter (Signed)
Patient's daughter called back and stated that patient was seeing Dr.Chow at [facility]. Phone number is [phone], Fax number [phone].  I faxed PA request to the above fax number.

## 2015-11-05 ENCOUNTER — Other Ambulatory Visit: Payer: Self-pay | Admitting: Internal Medicine

## 2015-11-05 DIAGNOSIS — E1142 Type 2 diabetes mellitus with diabetic polyneuropathy: Secondary | ICD-10-CM

## 2015-11-05 MED ORDER — HYDRALAZINE HCL 50 MG PO TABS: 50.0000 mg | ORAL_TABLET | Freq: Four times a day (QID) | ORAL | 3 refills | Status: DC

## 2015-11-05 MED ORDER — GABAPENTIN 300 MG PO CAPS: ORAL_CAPSULE | ORAL | 3 refills | Status: DC

## 2015-11-05 NOTE — Telephone Encounter (Signed)
[name] Requested. Refills faxed to pharmacy.

## 2015-11-06 ENCOUNTER — Emergency Department (HOSPITAL_COMMUNITY): Payer: Medicare Other

## 2015-11-06 ENCOUNTER — Inpatient Hospital Stay (HOSPITAL_COMMUNITY)
Admission: EM | Admit: 2015-11-06 | Discharge: 2015-11-11 | DRG: 308 | Disposition: A | Discharge status: Home Health | Payer: Medicare Other | Attending: Internal Medicine | Admitting: Internal Medicine

## 2015-11-06 ENCOUNTER — Encounter (HOSPITAL_COMMUNITY): Payer: Self-pay | Admitting: *Deleted

## 2015-11-06 DIAGNOSIS — E1142 Type 2 diabetes mellitus with diabetic polyneuropathy: Secondary | ICD-10-CM | POA: Diagnosis not present

## 2015-11-06 DIAGNOSIS — I482 Chronic atrial fibrillation: Secondary | ICD-10-CM | POA: Diagnosis present

## 2015-11-06 DIAGNOSIS — I455 Other specified heart block: Secondary | ICD-10-CM

## 2015-11-06 DIAGNOSIS — R531 Weakness: Secondary | ICD-10-CM | POA: Diagnosis not present

## 2015-11-06 DIAGNOSIS — E032 Hypothyroidism due to medicaments and other exogenous substances: Secondary | ICD-10-CM

## 2015-11-06 DIAGNOSIS — E785 Hyperlipidemia, unspecified: Secondary | ICD-10-CM | POA: Diagnosis present

## 2015-11-06 DIAGNOSIS — M1990 Unspecified osteoarthritis, unspecified site: Secondary | ICD-10-CM | POA: Diagnosis present

## 2015-11-06 DIAGNOSIS — I480 Paroxysmal atrial fibrillation: Secondary | ICD-10-CM | POA: Diagnosis present

## 2015-11-06 DIAGNOSIS — J45901 Unspecified asthma with (acute) exacerbation: Secondary | ICD-10-CM | POA: Diagnosis present

## 2015-11-06 DIAGNOSIS — I129 Hypertensive chronic kidney disease with stage 1 through stage 4 chronic kidney disease, or unspecified chronic kidney disease: Secondary | ICD-10-CM | POA: Diagnosis present

## 2015-11-06 DIAGNOSIS — E1122 Type 2 diabetes mellitus with diabetic chronic kidney disease: Secondary | ICD-10-CM | POA: Diagnosis present

## 2015-11-06 DIAGNOSIS — N183 Chronic kidney disease, stage 3 unspecified: Secondary | ICD-10-CM | POA: Diagnosis present

## 2015-11-06 DIAGNOSIS — K3184 Gastroparesis: Secondary | ICD-10-CM | POA: Diagnosis present

## 2015-11-06 DIAGNOSIS — Z6823 Body mass index (BMI) 23.0-23.9, adult: Secondary | ICD-10-CM

## 2015-11-06 DIAGNOSIS — R42 Dizziness and giddiness: Secondary | ICD-10-CM | POA: Diagnosis not present

## 2015-11-06 DIAGNOSIS — E039 Hypothyroidism, unspecified: Secondary | ICD-10-CM | POA: Diagnosis present

## 2015-11-06 DIAGNOSIS — D638 Anemia in other chronic diseases classified elsewhere: Secondary | ICD-10-CM | POA: Diagnosis present

## 2015-11-06 DIAGNOSIS — Z66 Do not resuscitate: Secondary | ICD-10-CM | POA: Diagnosis present

## 2015-11-06 DIAGNOSIS — R0602 Shortness of breath: Secondary | ICD-10-CM

## 2015-11-06 DIAGNOSIS — K219 Gastro-esophageal reflux disease without esophagitis: Secondary | ICD-10-CM | POA: Diagnosis present

## 2015-11-06 DIAGNOSIS — R001 Bradycardia, unspecified: Principal | ICD-10-CM | POA: Diagnosis present

## 2015-11-06 DIAGNOSIS — Z931 Gastrostomy status: Secondary | ICD-10-CM

## 2015-11-06 DIAGNOSIS — R109 Unspecified abdominal pain: Secondary | ICD-10-CM

## 2015-11-06 DIAGNOSIS — Z79899 Other long term (current) drug therapy: Secondary | ICD-10-CM

## 2015-11-06 DIAGNOSIS — E1143 Type 2 diabetes mellitus with diabetic autonomic (poly)neuropathy: Secondary | ICD-10-CM | POA: Diagnosis present

## 2015-11-06 DIAGNOSIS — I4892 Unspecified atrial flutter: Secondary | ICD-10-CM | POA: Diagnosis present

## 2015-11-06 DIAGNOSIS — K311 Adult hypertrophic pyloric stenosis: Secondary | ICD-10-CM | POA: Diagnosis not present

## 2015-11-06 DIAGNOSIS — M329 Systemic lupus erythematosus, unspecified: Secondary | ICD-10-CM | POA: Diagnosis present

## 2015-11-06 DIAGNOSIS — I10 Essential (primary) hypertension: Secondary | ICD-10-CM | POA: Diagnosis not present

## 2015-11-06 DIAGNOSIS — K5730 Diverticulosis of large intestine without perforation or abscess without bleeding: Secondary | ICD-10-CM | POA: Diagnosis not present

## 2015-11-06 DIAGNOSIS — Z8673 Personal history of transient ischemic attack (TIA), and cerebral infarction without residual deficits: Secondary | ICD-10-CM

## 2015-11-06 DIAGNOSIS — E43 Unspecified severe protein-calorie malnutrition: Secondary | ICD-10-CM | POA: Diagnosis present

## 2015-11-06 DIAGNOSIS — I4819 Other persistent atrial fibrillation: Secondary | ICD-10-CM | POA: Diagnosis present

## 2015-11-06 DIAGNOSIS — J449 Chronic obstructive pulmonary disease, unspecified: Secondary | ICD-10-CM | POA: Diagnosis present

## 2015-11-06 DIAGNOSIS — R1084 Generalized abdominal pain: Secondary | ICD-10-CM | POA: Diagnosis not present

## 2015-11-06 DIAGNOSIS — I2510 Atherosclerotic heart disease of native coronary artery without angina pectoris: Secondary | ICD-10-CM | POA: Diagnosis present

## 2015-11-06 DIAGNOSIS — E222 Syndrome of inappropriate secretion of antidiuretic hormone: Secondary | ICD-10-CM | POA: Diagnosis present

## 2015-11-06 DIAGNOSIS — Z833 Family history of diabetes mellitus: Secondary | ICD-10-CM

## 2015-11-06 DIAGNOSIS — Z823 Family history of stroke: Secondary | ICD-10-CM

## 2015-11-06 DIAGNOSIS — Z7901 Long term (current) use of anticoagulants: Secondary | ICD-10-CM

## 2015-11-06 DIAGNOSIS — I499 Cardiac arrhythmia, unspecified: Secondary | ICD-10-CM | POA: Diagnosis not present

## 2015-11-06 DIAGNOSIS — Z8249 Family history of ischemic heart disease and other diseases of the circulatory system: Secondary | ICD-10-CM

## 2015-11-06 LAB — CBC WITH DIFFERENTIAL/PLATELET
Basophils Absolute: 0 10*3/uL (ref 0.0–0.1)
Basophils Relative: 0 %
Eosinophils Absolute: 1.6 10*3/uL — ABNORMAL HIGH (ref 0.0–0.7)
Eosinophils Relative: 16 %
HCT: 26.2 % — ABNORMAL LOW (ref 36.0–46.0)
Hemoglobin: 8.6 g/dL — ABNORMAL LOW (ref 12.0–15.0)
Lymphocytes Relative: 14 %
Lymphs Abs: 1.3 10*3/uL (ref 0.7–4.0)
MCH: 31 pg (ref 26.0–34.0)
MCHC: 32.8 g/dL (ref 30.0–36.0)
MCV: 94.6 fL (ref 78.0–100.0)
Monocytes Absolute: 0.6 10*3/uL (ref 0.1–1.0)
Monocytes Relative: 7 %
Neutro Abs: 6.3 10*3/uL (ref 1.7–7.7)
Neutrophils Relative %: 63 %
Platelets: 305 10*3/uL (ref 150–400)
RBC: 2.77 MIL/uL — ABNORMAL LOW (ref 3.87–5.11)
RDW: 15.9 % — ABNORMAL HIGH (ref 11.5–15.5)
WBC: 9.8 10*3/uL (ref 4.0–10.5)

## 2015-11-06 LAB — URINALYSIS, ROUTINE W REFLEX MICROSCOPIC
Bilirubin Urine: NEGATIVE
Glucose, UA: NEGATIVE mg/dL
Hgb urine dipstick: NEGATIVE
Ketones, ur: NEGATIVE mg/dL
Leukocytes, UA: NEGATIVE
Nitrite: NEGATIVE
Protein, ur: NEGATIVE mg/dL
Specific Gravity, Urine: 1.012 (ref 1.005–1.030)
pH: 8 (ref 5.0–8.0)

## 2015-11-06 LAB — I-STAT TROPONIN, ED
Troponin i, poc: 0.01 ng/mL (ref 0.00–0.08)
Troponin i, poc: 0 ng/mL (ref 0.00–0.08)
Troponin i, poc: 0.02 ng/mL (ref 0.00–0.08)

## 2015-11-06 LAB — I-STAT VENOUS BLOOD GAS, ED
Bicarbonate: 23.4 mEq/L (ref 20.0–24.0)
O2 Saturation: 64 %
TCO2: 24 mmol/L (ref 0–100)
pCO2, Ven: 32.6 mmHg — ABNORMAL LOW (ref 45.0–50.0)
pH, Ven: 7.465 — ABNORMAL HIGH (ref 7.250–7.300)
pO2, Ven: 31 mmHg (ref 31.0–45.0)

## 2015-11-06 LAB — COMPREHENSIVE METABOLIC PANEL
ALT: 23 U/L (ref 14–54)
AST: 26 U/L (ref 15–41)
Albumin: 3.6 g/dL (ref 3.5–5.0)
Alkaline Phosphatase: 49 U/L (ref 38–126)
Anion gap: 9 (ref 5–15)
BUN: 50 mg/dL — ABNORMAL HIGH (ref 6–20)
CO2: 23 mmol/L (ref 22–32)
Calcium: 9.8 mg/dL (ref 8.9–10.3)
Chloride: 103 mmol/L (ref 101–111)
Creatinine, Ser: 1.66 mg/dL — ABNORMAL HIGH (ref 0.44–1.00)
GFR calc Af Amer: 30 mL/min — ABNORMAL LOW (ref >60)
GFR calc non Af Amer: 26 mL/min — ABNORMAL LOW (ref >60)
Glucose, Bld: 100 mg/dL — ABNORMAL HIGH (ref 65–99)
Potassium: 5.2 mmol/L — ABNORMAL HIGH (ref 3.5–5.1)
Sodium: 135 mmol/L (ref 135–145)
Total Bilirubin: 0.5 mg/dL (ref 0.3–1.2)
Total Protein: 7.6 g/dL (ref 6.5–8.1)

## 2015-11-06 LAB — LIPASE, BLOOD: Lipase: 21 U/L (ref 11–51)

## 2015-11-06 LAB — BLOOD GAS, VENOUS

## 2015-11-06 LAB — LACTIC ACID, PLASMA
Lactic Acid, Venous: 1.2 mmol/L (ref 0.5–1.9)
Lactic Acid, Venous: 0.9 mmol/L (ref 0.5–1.9)

## 2015-11-06 MED ORDER — ACETAMINOPHEN 650 MG RE SUPP: 650.0000 mg | Freq: Four times a day (QID) | RECTAL | Status: DC | PRN

## 2015-11-06 MED ORDER — SODIUM CHLORIDE 1 G PO TABS
1.0000 g | ORAL_TABLET | Freq: Three times a day (TID) | ORAL | Status: DC
  Administered 2015-11-07 – 2015-11-11 (×14): 1 g via ORAL
  Filled 2015-11-06 (×13): qty 1

## 2015-11-06 MED ORDER — ACETAMINOPHEN 325 MG PO TABS
650.0000 mg | ORAL_TABLET | Freq: Four times a day (QID) | ORAL | Status: DC | PRN
  Administered 2015-11-07: 650 mg via ORAL
  Filled 2015-11-06: qty 2

## 2015-11-06 MED ORDER — PRO-STAT SUGAR FREE PO LIQD
30.0000 mL | Freq: Two times a day (BID) | ORAL | Status: DC
  Administered 2015-11-07 – 2015-11-11 (×9): 30 mL via ORAL
  Filled 2015-11-06 (×9): qty 30

## 2015-11-06 MED ORDER — PANTOPRAZOLE SODIUM 40 MG PO TBEC
40.0000 mg | DELAYED_RELEASE_TABLET | Freq: Two times a day (BID) | ORAL | Status: DC
  Administered 2015-11-07 – 2015-11-09 (×7): 40 mg via ORAL
  Filled 2015-11-06 (×7): qty 1

## 2015-11-06 MED ORDER — LEVOTHYROXINE SODIUM 100 MCG PO TABS
100.0000 ug | ORAL_TABLET | Freq: Every day | ORAL | Status: DC
  Administered 2015-11-07 – 2015-11-08 (×2): 100 ug via ORAL
  Filled 2015-11-06 (×2): qty 1

## 2015-11-06 MED ORDER — FENTANYL CITRATE (PF) 100 MCG/2ML IJ SOLN
25.0000 ug | Freq: Once | INTRAMUSCULAR | Status: AC
  Administered 2015-11-06: 25 ug via INTRAVENOUS
  Filled 2015-11-06: qty 2

## 2015-11-06 MED ORDER — GABAPENTIN 300 MG PO CAPS: 300.0000 mg | ORAL_CAPSULE | ORAL | Status: DC

## 2015-11-06 MED ORDER — SIMETHICONE 80 MG PO CHEW
80.0000 mg | CHEWABLE_TABLET | Freq: Four times a day (QID) | ORAL | Status: DC | PRN
  Filled 2015-11-06: qty 1

## 2015-11-06 MED ORDER — BISACODYL 10 MG RE SUPP: 10.0000 mg | Freq: Every day | RECTAL | Status: DC | PRN

## 2015-11-06 MED ORDER — POLYETHYLENE GLYCOL 3350 17 G PO PACK: 17.0000 g | PACK | Freq: Two times a day (BID) | ORAL | Status: DC | PRN

## 2015-11-06 MED ORDER — APIXABAN 2.5 MG PO TABS
2.5000 mg | ORAL_TABLET | Freq: Two times a day (BID) | ORAL | Status: DC
  Administered 2015-11-07 – 2015-11-11 (×10): 2.5 mg via ORAL
  Filled 2015-11-06 (×10): qty 1

## 2015-11-06 MED ORDER — AMLODIPINE BESYLATE 10 MG PO TABS
10.0000 mg | ORAL_TABLET | Freq: Every day | ORAL | Status: DC
  Administered 2015-11-07 – 2015-11-11 (×5): 10 mg via ORAL
  Filled 2015-11-06 (×5): qty 1

## 2015-11-06 MED ORDER — HYDRALAZINE HCL 50 MG PO TABS
50.0000 mg | ORAL_TABLET | Freq: Three times a day (TID) | ORAL | Status: DC
  Administered 2015-11-07 – 2015-11-11 (×17): 50 mg via ORAL
  Filled 2015-11-06 (×18): qty 1

## 2015-11-06 MED ORDER — SODIUM CHLORIDE 0.9 % IV BOLUS (SEPSIS)
1000.0000 mL | Freq: Once | INTRAVENOUS | Status: AC
  Administered 2015-11-06: 1000 mL via INTRAVENOUS

## 2015-11-06 MED ORDER — ALBUTEROL SULFATE (2.5 MG/3ML) 0.083% IN NEBU: 2.5000 mg | INHALATION_SOLUTION | Freq: Four times a day (QID) | RESPIRATORY_TRACT | Status: DC | PRN

## 2015-11-06 MED ORDER — NYSTATIN 100000 UNIT/GM EX POWD
Freq: Every day | CUTANEOUS | Status: DC
  Administered 2015-11-07 – 2015-11-11 (×5): via TOPICAL
  Filled 2015-11-06: qty 15

## 2015-11-06 MED ORDER — GABAPENTIN 600 MG PO TABS
600.0000 mg | ORAL_TABLET | Freq: Two times a day (BID) | ORAL | Status: DC
  Administered 2015-11-07 – 2015-11-11 (×10): 600 mg via ORAL
  Filled 2015-11-06 (×10): qty 1

## 2015-11-06 MED ORDER — ONDANSETRON HCL 4 MG/2ML IJ SOLN
4.0000 mg | Freq: Once | INTRAMUSCULAR | Status: AC
  Administered 2015-11-06: 4 mg via INTRAVENOUS
  Filled 2015-11-06: qty 2

## 2015-11-06 MED ORDER — GABAPENTIN 300 MG PO CAPS
300.0000 mg | ORAL_CAPSULE | Freq: Every day | ORAL | Status: DC
  Administered 2015-11-07 – 2015-11-11 (×5): 300 mg via ORAL
  Filled 2015-11-06 (×5): qty 1

## 2015-11-06 MED ORDER — MOMETASONE FURO-FORMOTEROL FUM 200-5 MCG/ACT IN AERO
2.0000 | INHALATION_SPRAY | Freq: Two times a day (BID) | RESPIRATORY_TRACT | Status: DC
  Administered 2015-11-07 – 2015-11-10 (×7): 2 via RESPIRATORY_TRACT
  Filled 2015-11-06: qty 8.8

## 2015-11-06 MED ORDER — ISOSORBIDE MONONITRATE ER 30 MG PO TB24
30.0000 mg | ORAL_TABLET | Freq: Every day | ORAL | Status: DC
  Administered 2015-11-07 – 2015-11-11 (×5): 30 mg via ORAL
  Filled 2015-11-06 (×5): qty 1

## 2015-11-06 MED ORDER — IOPAMIDOL (ISOVUE-300) INJECTION 61%
INTRAVENOUS | Status: AC
  Administered 2015-11-06: 75 mL
  Filled 2015-11-06: qty 75

## 2015-11-06 MED ORDER — FREE WATER
60.0000 mL | Freq: Four times a day (QID) | Status: DC
  Administered 2015-11-07 – 2015-11-11 (×17): 60 mL

## 2015-11-06 MED ORDER — SERTRALINE HCL 100 MG PO TABS
100.0000 mg | ORAL_TABLET | Freq: Every day | ORAL | Status: DC
  Administered 2015-11-07 – 2015-11-11 (×5): 100 mg via ORAL
  Filled 2015-11-06 (×5): qty 1

## 2015-11-06 MED ORDER — AMIODARONE HCL 200 MG PO TABS
100.0000 mg | ORAL_TABLET | Freq: Every day | ORAL | Status: DC
  Administered 2015-11-07 – 2015-11-11 (×5): 100 mg via ORAL
  Filled 2015-11-06 (×5): qty 1

## 2015-11-06 MED ORDER — OSMOLITE 1.5 CAL PO LIQD
600.0000 mL | Freq: Every day | ORAL | Status: DC
  Administered 2015-11-07 (×2): 600 mL
  Filled 2015-11-06 (×5): qty 711

## 2015-11-06 MED ORDER — TRAMADOL HCL 50 MG PO TABS
50.0000 mg | ORAL_TABLET | Freq: Every day | ORAL | Status: DC | PRN
  Administered 2015-11-07: 50 mg via ORAL
  Filled 2015-11-06: qty 1

## 2015-11-06 MED ORDER — BOOST / RESOURCE BREEZE PO LIQD
1.0000 | Freq: Three times a day (TID) | ORAL | Status: DC
  Administered 2015-11-07 – 2015-11-08 (×4): 1 via ORAL

## 2015-11-06 MED ORDER — ATORVASTATIN CALCIUM 10 MG PO TABS
10.0000 mg | ORAL_TABLET | Freq: Every day | ORAL | Status: DC
  Administered 2015-11-07 – 2015-11-10 (×4): 10 mg via ORAL
  Filled 2015-11-06 (×4): qty 1

## 2015-11-06 NOTE — ED Notes (Signed)
Pt requesting pain meds. MD notified.

## 2015-11-06 NOTE — ED Triage Notes (Addendum)
Pt here via [facility] from home for weakness "all day".  Pt was en-route to [facility] when HR decreased to 33.  Pt initially unable to respond appropriately, but after several minutes able to respond in full sentences.  Pt c/o abdominal pain post peg tube placement on Thurs.

## 2015-11-06 NOTE — ED Notes (Signed)
Attempted to call report. RN unavailable due to pt care

## 2015-11-06 NOTE — Consult Note (Signed)
**Note Shannon-Identified via Obfuscation** Cardiology Consult    Patient ID: Shannon [name] MRN: [id number], DOB/AGE: 08-07-1923   Admit date: 11/06/2015 Date of Consult: 11/06/2015  Primary Physician: Shannon Boys, DO Reason for Consult: Symptomatic bradycardia Primary Cardiologist: Dr. Delton [name] Requesting Provider: Dr. Joen [name]   History of Present Illness    [name] is a 82 year old female with a significant past medical history of paroxysmal atrial fibrillation/flutter on amiodarone, beta-blocker intolerance, non-obstructive coronary artery disease, type 2 diabetes, hypertension, cerebro-vascular disease, COPD, hypothyroidism, SIADH, and status post GJ tube is here for weakness.  Cardiology was consulted for symptomatic bradycardia.  The patient reports for the past week she has been feeling weak. When she tries to get out of bed, her symptoms become worse and she often has lightheadedness.  She feels well when she is just resting in bed.  Other complaints is a feeling like her heart is "skipping a beat".  Other complaints include shortness of breath.  She denies any chest pain or fainting, but does state she sometimes feel that she is going to faint.  Patient is unable to recall all the medications she is taking at this time.  She says that she has had similar symptoms after having her feeding tube placed, but feels that her symptoms are more severe now than prior.    Of note, patient was hospitalized in May of this year for bradycardia for which the patient recalls.  She was taken off her metoprolol she was taking at the time and her bradycardia resolved.  She did require dopamine initially during her hospitalization for heart rate control.  He amiodarone was eventually added back during her hospitalization without issues with bradycardia.    Past Medical History   Past Medical History:  Diagnosis Date  . Anemia    previous blood transfusions  . Arthritis    "all over"  . Asthma   . Bradycardia    topped  BB and amiodarone decreased- junctional rhythm   . COPD (chronic obstructive pulmonary disease) (HCC)   . Depression    "light case"  . Gastric stenosis   . GERD (gastroesophageal reflux disease)   . History of stomach ulcers   . Hyperlipidemia   . Hypertension   . Hypothyroidism   . Paraesophageal hernia   . Perforated gastric ulcer (HCC)   . Pneumonia   . Renal insufficiency   . SIADH (syndrome of inappropriate ADH production) (HCC)    Shannon [name] 01/10/2015  . Small bowel obstruction (HCC)    "I don't know how many" (01/11/2015)  . Stroke (HCC)    "light one"  . Type II diabetes mellitus (HCC)   . Ventral hernia with bowel obstruction     Past Surgical History:  Procedure Laterality Date  . CATARACT EXTRACTION W/ INTRAOCULAR LENS  IMPLANT, BILATERAL    . CHOLECYSTECTOMY OPEN    . COLECTOMY    . ESOPHAGOGASTRODUODENOSCOPY N/A 01/19/2014   Procedure: ESOPHAGOGASTRODUODENOSCOPY (EGD);  Surgeon: Shannon Fredrickson, MD;  Location: [facility]COPY;  Service: Endoscopy;  Laterality: N/A;  . ESOPHAGOGASTRODUODENOSCOPY N/A 01/20/2014   Procedure: ESOPHAGOGASTRODUODENOSCOPY (EGD);  Surgeon: Shannon Fredrickson, MD;  Location: [facility] END[facility]COPY;  Service: Endoscopy;  Laterality: N/A;  . ESOPHAGOGASTRODUODENOSCOPY N/A 03/19/2014   Procedure: ESOPHAGOGASTRODUODENOSCOPY (EGD);  Surgeon: Shannon Fee, MD;  Location: [facility];  Service: Endoscopy;  Laterality: N/A;  . ESOPHAGOGASTRODUODENOSCOPY N/A 07/08/2015   Procedure: ESOPHAGOGASTRODUODENOSCOPY (EGD);  Surgeon: Shannon Rist, MD;  Location: [facility] END[facility]COPY;  Service: Endoscopy;  Laterality: N/A;  . ESOPHAGOGASTRODUODENOSCOPY (EGD) WITH PROPOFOL N/A 09/15/2015   Procedure: ESOPHAGOGASTRODUODENOSCOPY (EGD) WITH PROPOFOL;  Surgeon: Shannon Frederick, MD;  Location: [facility] ENDOSCOPY;  Service: Gastroenterology;  Laterality: N/A;  . GASTROJEJUNOSTOMY     hx/notes 01/10/2015  . GASTROJEJUNOSTOMY N/A 09/23/2015   Procedure: OPEN GASTROJEJUNOSTOMY TUBE PLACEMENT;   Surgeon: Shannon Blanch Kinsinger, MD;  Location: [facility] ORS;  Service: General;  Laterality: N/A;  . HERNIA REPAIR  2015  . LAPAROTOMY N/A 01/20/2015   Procedure: EXPLORATORY LAPAROTOMY;  Surgeon: Shannon Miyamoto, MD;  Location: [facility] OR;  Service: General;  Laterality: N/A;  . LYSIS OF ADHESION N/A 01/20/2015   Procedure: LYSIS OF ADHESIONS < 1 HOUR;  Surgeon: Shannon Miyamoto, MD;  Location: [facility] OR;  Service: General;  Laterality: N/A;  . TUBAL LIGATION    . VENTRAL HERNIA REPAIR  2015   incarcerated ventral hernia ([facility] 09/2013)/notes 01/10/2015     Allergies  No Known Allergies  Inpatient Medications      Family History    Family History  Problem Relation Age of Onset  . Stroke Mother   . Hypertension Mother   . Diabetes Brother   . Heart attack Neg Hx     Social History    Social History   Social History  . Marital status: Widowed    Spouse name: N/A  . Number of children: 3  . Years of education: N/A   Occupational History  . Not on file.   Social History Main Topics  . Smoking status: Never Smoker  . Smokeless tobacco: Former Neurosurgeon    Types: Snuff  . Alcohol use No  . Drug use: No  . Sexual activity: No   Other Topics Concern  . Not on file   Social History Narrative   Married in [place], lives in one story house with 2 people and no pets   Occupation: ?   Has a living Will, [place], and doesn't have DNR   Was living with Husband (in frail health) in [place] until 09/2013.  After her [facility] admission they both came to live with daughter in [place]     Review of Systems   All other systems reviewed and are otherwise negative except as noted above.  Physical Exam    Blood pressure 150/60, pulse (!) 56, temperature 97.8 F (36.6 C), resp. rate 17, SpO2 100 %.  General: Pleasant, NAD Psych: Normal affect. Neuro: Alert and oriented X 3. Moves all extremities spontaneously. HEENT: Normal  Neck: Supple without bruits or JVD. Lungs:  Resp regular and unlabored,  CTA. Heart: RRR no s3, s4, or murmurs. Abdomen: Soft, non-tender, non-distended, BS + x 4.  Extremities: No clubbing, cyanosis or edema. DP/PT/Radials 2+ and equal bilaterally.  Labs    Troponin [facility] of Care Test)  Recent Labs  11/06/15 2144  TROPIPOC 0.02   No results for input(s): CKTOTAL, CKMB, TROPONINI in the last 72 hours. Lab Results  Component Value Date   WBC 9.8 11/06/2015   HGB 8.6 (L) 11/06/2015   HCT 26.2 (L) 11/06/2015   MCV 94.6 11/06/2015   PLT 305 11/06/2015    Recent Labs Lab 11/06/15 1612  NA 135  K 5.2*  CL 103  CO2 23  BUN 50*  CREATININE 1.66*  CALCIUM 9.8  PROT 7.6  BILITOT 0.5  ALKPHOS 49  ALT 23  AST 26  GLUCOSE 100*   Lab Results  Component Value Date   CHOL 126 04/07/2015   HDL 69 04/07/2015  LDLCALC 36 04/07/2015   TRIG 107 04/07/2015   No results found for: [facility]   Radiology Studies    Ct Abdomen Pelvis Wo Contrast  Result Date: 11/06/2015 CLINICAL DATA:  Abdominal pain since PEG tube placement on Thursday. EXAM: CT ABDOMEN AND PELVIS WITHOUT CONTRAST TECHNIQUE: Multidetector CT imaging of the abdomen and pelvis was performed following the standard protocol without IV contrast. COMPARISON:  CT abdomen dated 09/11/2015. FINDINGS: Lower chest: Mild bibasilar atelectasis. Small left pleural effusion. Hepatobiliary: Status post cholecystectomy. Small hypodense lesions within the liver were better demonstrated on the earlier contrast enhanced CT. Pancreas: No mass or inflammatory process identified on this un-enhanced exam. Spleen: Within normal limits in size. Adrenals/Urinary Tract: Kidneys are unremarkable without stone or hydronephrosis. No ureteral or bladder calculi identified. Stomach/Bowel: Interval resolution of the stomach distension status post percutaneous gastrostomy placement. Enteric tube placed via the gastrostomy site has its tip in the descending duodenum. Surgical small bowel anastomosis noted within the left abdomen,  without evidence of obstruction or other surgical complicating feature. No dilated large or small bowel loops. Scattered diverticulosis noted throughout the colon without evidence of acute diverticulitis. Vascular/Lymphatic: Heavy atherosclerotic changes of the normal caliber abdominal aorta and pelvic vasculature. No enlarged lymph nodes seen. Reproductive: No mass or other significant abnormality. Other: No free fluid or abscess collection identified. No free intraperitoneal air. Musculoskeletal: Degenerative changes throughout the thoracolumbar spine. No acute or suspicious osseous finding. Superficial soft tissues are unremarkable. IMPRESSION: 1. Significant reduction in the gastric distention status post gastrostomy, with associated enteric tube passing through the stomach with tip in the descending duodenum. 2. Remainder of the abdomen and pelvis is unremarkable. No small or large bowel obstruction. No free fluid or abscess. No free intraperitoneal air. 3. Colonic diverticulosis without evidence of acute diverticulitis. 4. Aortic atherosclerosis. 5. Small left pleural effusion and mild bibasilar atelectasis. Electronically Signed   By: Bary Richard M.D.   On: 11/06/2015 18:53   Dg Chest 2 View  Result Date: 10/27/2015 CLINICAL DATA:  Short of breath, dry cough for several weeks EXAM: CHEST  2 VIEW COMPARISON:  Chest x-ray of 09/11/2015 FINDINGS: Small pleural effusions again are noted left-greater-than-right with mild basilar atelectasis. No definite pneumonia is seen. Mediastinal and hilar contours are unremarkable. The heart is mildly enlarged. There may be minimal pulmonary vascular congestion present. IMPRESSION: 1. Small pleural effusions left-greater-than-right with mild bibasilar linear atelectasis. 2. Cardiomegaly.  Question mild pulmonary vascular congestion. Electronically Signed   By: Dwyane Dee M.D.   On: 10/27/2015 16:42  Ct Head Wo Contrast  Result Date: 11/06/2015 CLINICAL DATA:   Weakness, low heart rate. EXAM: CT HEAD WITHOUT CONTRAST TECHNIQUE: Contiguous axial images were obtained from the base of the skull through the vertex without intravenous contrast. COMPARISON:  Head CT dated 03/22/2014 and brain MRI dated 03/23/2014. FINDINGS: Brain: Again noted is generalized age related parenchymal atrophy with commensurate dilatation of the ventricles and sulci. Chronic small vessel ischemic changes again noted within the bilateral periventricular and subcortical white matter regions. There is no mass, hemorrhage, edema or other evidence of acute parenchymal abnormality. No extra-axial hemorrhage. Vascular: No hyperdense vessel or unexpected calcification. There are chronic calcified atherosclerotic changes of the large vessels at the skull base. Skull: Negative for fracture or focal lesion. Sinuses/Orbits: No acute findings. Other: None. IMPRESSION: 1. No acute intracranial abnormality. No intracranial mass, hemorrhage or edema. 2. Atrophy and chronic ischemic changes in the white matter. Electronically Signed   By: Weyman Croon  Linde Gillis M.D.   On: 11/06/2015 18:41   Dg Abd Portable 1v  Result Date: 11/06/2015 CLINICAL DATA:  Generalized abdominal pain for 2 days. EXAM: PORTABLE ABDOMEN - 1 VIEW COMPARISON:  Abdomen x-ray dated 09/12/2015. FINDINGS: Single upright AP view centered at the level of the upper abdomen. Study limited by numerous overlying wires and pacer pads. Visualized bowel gas pattern is nonobstructive. No evidence of free intraperitoneal air seen. Probable small pleural effusions and/or atelectasis at each lung base. IMPRESSION: Limited exam due to numerous overlying, presumed extraneous, wires and pacer pads. Visualized bowel gas pattern is nonobstructive and there is no evidence of free intraperitoneal air seen. Probable small pleural effusions and/or atelectasis at each lung base. Electronically Signed   By: Bary Richard M.D.   On: 11/06/2015 16:52    EKG & Cardiac Imaging     EKG: intermittent sinoatrial arrest with escape junctional rhythm  Echocardiogram: 07/08/15 - Left ventricle: The cavity size was normal. Wall thickness was   increased in a pattern of mild LVH. Systolic function was normal.   The estimated ejection fraction was in the range of 60% to 65%.   Doppler parameters are consistent with abnormal left ventricular   relaxation (grade 1 diastolic dysfunction). - Aortic valve: There was mild regurgitation. - Mitral valve: There was mild regurgitation. - Left atrium: The atrium was moderately to severely dilated  Assessment & Plan    [name] is a 82 year old female with a significant past medical history of paroxysmal atrial fibrillation/flutter on amiodarone, beta-blocker intolerance, non-obstructive coronary artery disease, type 2 diabetes, hypertension, cerebro-vascular disease, COPD, hypothyroidism, SIADH, and status post GJ tube here with likely symptomatic bradycardia.  ECG shows intermittent sinus pauses that is likely iatrogenically induced secondary to labetalol.  Patient was taken off of beta-blockers before for a similar issue.  Other things to consider is sick sinus syndrome if rhythm continues off beta-blockers.  Other things to consider is uncontrolled hypothyroidism in the setting that her last TSH in 7/17 was 81.69 IU/mL.  # Brady-arrhythmia: Likely multifactorial (hypothyroidism and labetalol) use in the setting of a 89 year women.  Patient with symptoms that are likely related to her bradycardia.  She appears to be hemodynamically stable at this time.   - Would not recommend a pacemaker at this time.   - Check TSH and manage hypothyroidism accordingly.   - Continue to hold labetalol  # Hypertension: Patient with a known history of essential hypertension with blood pressure at goal based on age.  She does have renal failure in the setting of hypertension and diabetes.   - No changes in home blood pressure medications except for  stopping labetalol.  # Paroxysmal atrial fibrillation Patient with a known history of paroxysmal atrial fibrillation/flutter.  ECG does not show atrial fibrillation at this time.  She is currently on amiodarone for rhythm control and taking Apixaban for stroke secondary prevention.   - Continue apixaban and Amiodarone for now.    Signed, Judie Grieve, MD 11/06/2015, 10:56 PM

## 2015-11-06 NOTE — ED Notes (Signed)
HR down to 29. Dr. Roxanne Mins at bedside. EKG printed

## 2015-11-06 NOTE — H&P (Signed)
History and Physical  Patient Name: Shannon [name]     JXB:[id number]    DOB: 09-26-1923    DOA: 11/06/2015 PCP: Shannon Boys, DO   Patient coming from: Home  Chief Complaint: "Spells"  HPI: Shannon [name] is a 82 y.o. female with a past medical history significant for Afib on apixaban and amiodarone, remote CVA, HTN, SIADH, hypothyroidism, anemia, CKD, and gastric outlet obstruction with recent PEG tube who presents with weak spells and bradycardia, increasing in frequency.  History is collected from the patient and her daughter Shannon [name] by phone.  Patient reports that she has had a marked worsening in functional status since her SBO LOA last October.  Since then, she feels that she has had, every once in a while, a "spell" of weakness and sweats.  Now, in the last 2-3 days, they are accelerating and she has had one or more each day.  The spells last a few minutes only, she starts to get very weak, then sweaty and soakingly diaphoretic, then to have discomfort "from my chest to my head" and to be so weak all over she can't move, and also to have right-sided weakness like her old stroke transiently during each episode.  Today, her daughter describes 2 such episodes. The first one she was getting dressed, had to sit down, had right leg weakness, sweating, and then resolved. The second occurred while she was eating her midday snack: she went to stand, but her daughter made her sit in her wheelchair because she appeared faint, then wheeled her to her bedroom on her walker where she flopped on the bed and appeared diaphoretic.  Both times, daughter reports that she took her pulse and it was low.  The second time she called EMS who found her HR 30s and hypotensive, and brought her to the ER.  ED course: -Afebrile, heart rate 60, respirations 26, hypertensive, saturating well on room air -Na 135, K 5.2, Cr 1.66 (baseline 1.4-1.6), WBC 9.8 K, Hgb 8.6 (baseline 8-10) -Troponin and lactic acid normal -CT head  negative -There was concern because of grimacing (which is actually her baseline) and her abdominal guarding (also baseline) that she had an acute abdomen, but CT abdomen and pelvis was negative for new pathology -ECG showed sinus rhythm, rate 60 without ischemia -While in the ER, the patient had 2-3 episodes of bradycardia to the 30s and 40s, associated with "feeling cold" again -Repeat ECG during one episode shows some atrial beats and some junctional escape beats     Review of Systems:  Review of Systems  Constitutional: Positive for diaphoresis. Negative for chills, fever and malaise/fatigue.  HENT: Negative.   Eyes: Negative.   Respiratory: Negative.   Cardiovascular: Negative for chest pain, palpitations, orthopnea, claudication, leg swelling and PND.  Gastrointestinal: Negative.   Genitourinary: Negative.   Musculoskeletal: Negative.   Skin: Negative.   Neurological: Positive for dizziness and weakness. Negative for tingling, tremors, sensory change, speech change, focal weakness, seizures and loss of consciousness.  Endo/Heme/Allergies: Negative.   Psychiatric/Behavioral: Negative.       Past Medical History:  Diagnosis Date  . Anemia    previous blood transfusions  . Arthritis    "all over"  . Asthma   . Bradycardia    topped BB and amiodarone decreased- junctional rhythm   . COPD (chronic obstructive pulmonary disease) (HCC)   . Depression    "light case"  . Gastric stenosis   . GERD (gastroesophageal reflux disease)   . History of  stomach ulcers   . Hyperlipidemia   . Hypertension   . Hypothyroidism   . Paraesophageal hernia   . Perforated gastric ulcer (HCC)   . Pneumonia   . Renal insufficiency   . SIADH (syndrome of inappropriate ADH production) (HCC)    Shannon [name] 01/10/2015  . Small bowel obstruction (HCC)    "I don't know how many" (01/11/2015)  . Stroke (HCC)    "light one"  . Type II diabetes mellitus (HCC)   . Ventral hernia with bowel  obstruction     Past Surgical History:  Procedure Laterality Date  . CATARACT EXTRACTION W/ INTRAOCULAR LENS  IMPLANT, BILATERAL    . CHOLECYSTECTOMY OPEN    . COLECTOMY    . ESOPHAGOGASTRODUODENOSCOPY N/A 01/19/2014   Procedure: ESOPHAGOGASTRODUODENOSCOPY (EGD);  Surgeon: Hilarie Fredrickson, MD;  Location: [facility];  Service: Endoscopy;  Laterality: N/A;  . ESOPHAGOGASTRODUODENOSCOPY N/A 01/20/2014   Procedure: ESOPHAGOGASTRODUODENOSCOPY (EGD);  Surgeon: Hilarie Fredrickson, MD;  Location: [facility];  Service: Endoscopy;  Laterality: N/A;  . ESOPHAGOGASTRODUODENOSCOPY N/A 03/19/2014   Procedure: ESOPHAGOGASTRODUODENOSCOPY (EGD);  Surgeon: Rachael Fee, MD;  Location: [facility];  Service: Endoscopy;  Laterality: N/A;  . ESOPHAGOGASTRODUODENOSCOPY N/A 07/08/2015   Procedure: ESOPHAGOGASTRODUODENOSCOPY (EGD);  Surgeon: Sherrilyn Rist, MD;  Location: [facility] ENDOSCOPY;  Service: Endoscopy;  Laterality: N/A;  . ESOPHAGOGASTRODUODENOSCOPY (EGD) WITH PROPOFOL N/A 09/15/2015   Procedure: ESOPHAGOGASTRODUODENOSCOPY (EGD) WITH PROPOFOL;  Surgeon: Ruffin Frederick, MD;  Location: [facility]COPY;  Service: Gastroenterology;  Laterality: N/A;  . GASTROJEJUNOSTOMY     hx/notes 01/10/2015  . GASTROJEJUNOSTOMY N/A 09/23/2015   Procedure: OPEN GASTROJEJUNOSTOMY TUBE PLACEMENT;  Surgeon: De Blanch Kinsinger, MD;  Location: [facility] ORS;  Service: General;  Laterality: N/A;  . HERNIA REPAIR  2015  . LAPAROTOMY N/A 01/20/2015   Procedure: EXPLORATORY LAPAROTOMY;  Surgeon: Abigail Miyamoto, MD;  Location: [facility] OR;  Service: General;  Laterality: N/A;  . LYSIS OF ADHESION N/A 01/20/2015   Procedure: LYSIS OF ADHESIONS < 1 HOUR;  Surgeon: Abigail Miyamoto, MD;  Location: [facility] OR;  Service: General;  Laterality: N/A;  . TUBAL LIGATION    . VENTRAL HERNIA REPAIR  2015   incarcerated ventral hernia ([facility] 09/2013)/notes 01/10/2015    Social History: Patient lives with her daughter.  Patient walks with a walker.  She never  smoked.  She is independent with all ADLs and has no dementia.    No Known Allergies  Family history: family history includes Diabetes in her brother; Hypertension in her mother; Stroke in her mother.  Prior to Admission medications   Medication Sig Start Date End Date Taking? Authorizing Provider  acetaminophen (TYLENOL) 325 MG tablet Take 650 mg by mouth 2 (two) times daily.    Yes Historical Provider, MD  ADVAIR DISKUS 250-50 MCG/DOSE AEPB INHALE 1 PUFF INTO THE LUNGS 2 (TWO) TIMES DAILY AS NEEDED FOR SHORTNESS OF BREATH 12/01/14  Yes Historical Provider, MD  albuterol (PROVENTIL HFA;VENTOLIN HFA) 108 (90 Base) MCG/ACT inhaler Inhale 2 puffs into the lungs every 6 (six) hours as needed for wheezing or shortness of breath. 06/04/15  Yes Shannon Boys, DO  Amino Acids-Protein Hydrolys (FEEDING SUPPLEMENT, PRO-STAT SUGAR FREE 64,) LIQD Place 30 mLs into feeding tube 2 (two) times daily. Patient taking differently: Take 30 mLs by mouth 2 (two) times daily.  09/30/15  Yes Calvert Cantor, MD  amiodarone (PACERONE) 100 MG tablet TAKE ONE TABLET BY MOUTH DAILY Patient taking differently: TAKE ONE TABLET 100 mg) BY MOUTH DAILY 11/05/15  Yes Shannon Boys, DO  amLODipine (NORVASC) 10 MG tablet TAKE 1 TABLET BY MOUTH DAILY FOR HYPERTENSION Patient taking differently: TAKE 1 TABLET (10 mg) BY MOUTH DAILY FOR HYPERTENSION 08/09/15  Yes Shannon Boys, DO  apixaban (ELIQUIS) 2.5 MG TABS tablet Take 1 tablet (2.5 mg total) by mouth 2 (two) times daily. 08/11/15  Yes Shannon Boys, DO  atorvastatin (LIPITOR) 10 MG tablet Take one tablet by mouth once daily Patient taking differently: Take 10 mg by mouth daily at 6 PM. Take one tablet by mouth once daily 09/03/15  Yes Shannon Boys, DO  b complex vitamins tablet Take 1 tablet by mouth daily.    Yes Historical Provider, MD  calcium-vitamin D (OSCAL WITH D) 500-200 MG-UNIT per tablet Take 1 tablet by mouth daily with breakfast.    Yes Historical Provider, MD  chlorhexidine  (PERIDEX) 0.12 % solution Use as directed 15 mLs in the mouth or throat 2 (two) times daily.   Yes Historical Provider, MD  [employer] GAS RELIEF 80 MG chewable tablet CHEW 1 TABLET BY MOUTH 4 TIMES A DAY AS NEEDED FOR FLATULENCE Patient taking differently: CHEW 1 TABLET (80 mg) BY MOUTH 4 TIMES A DAY AS NEEDED FOR FLATULENCE 06/28/15  Yes Shannon Boys, DO  diclofenac sodium (VOLTAREN) 1 % GEL Apply 2 g topically daily as needed (for pain).    Yes Historical Provider, MD  feeding supplement (BOOST / RESOURCE BREEZE) LIQD Take 1 Container by mouth 3 (three) times daily between meals. Patient taking differently: Take 0.5-1 Containers by mouth daily.  01/27/15  Yes Shanker Levora Dredge, MD  fluticasone (FLONASE) 50 MCG/ACT nasal spray Place 2 sprays into both nostrils daily. 08/07/14  Yes Shannon Boys, DO  gabapentin (NEURONTIN) 300 MG capsule Take 2 caps po in AM and qhs, take 1 cap daily at [facility] Patient taking differently: Take 300-600 mg by mouth See admin instructions. Take 2 caps po in AM and qhs, take 1 cap daily at [facility] 11/05/15  Yes Shannon Boys, DO  hydrALAZINE (APRESOLINE) 50 MG tablet Take 1 tablet (50 mg total) by mouth 4 (four) times daily. 11/05/15  Yes Shannon Boys, DO  isosorbide mononitrate (IMDUR) 30 MG 24 hr tablet TAKE 1 TABLET (30 MG TOTAL) BY MOUTH DAILY. 06/22/15  Yes Historical Provider, MD  labetalol (NORMODYNE) 100 MG tablet TAKE 1 TABLET (100 MG TOTAL) BY MOUTH 2 (TWO) TIMES DAILY. 11/01/15  Yes Kimber Relic, MD  latanoprost (XALATAN) 0.005 % ophthalmic solution Place 1 drop into both eyes at bedtime. 06/02/14  Yes Oneal Grout, MD  levothyroxine (SYNTHROID, LEVOTHROID) 100 MCG tablet Take 1 tablet (100 mcg total) by mouth daily before breakfast. 10/11/15  Yes Shannon Boys, DO  Nutritional Supplements (FEEDING SUPPLEMENT, OSMOLITE 1.5 CAL,) LIQD Place 1,000 mLs into feeding tube daily. 09/30/15  Yes Calvert Cantor, MD  nystatin (MYCOSTATIN/NYSTOP) powder Apply topically 2 (two) times daily. To  rash Patient taking differently: Apply topically daily. To rash 10/19/15  Yes Shannon Boys, DO  ondansetron (ZOFRAN) 4 MG tablet Take 1 tablet (4 mg total) by mouth every 8 (eight) hours as needed for nausea or vomiting. 09/07/15  Yes Sharon Seller, NP  pantoprazole (PROTONIX) 40 MG tablet TAKE 1 TABLET BY MOUTH TWICE A DAY Patient taking differently: TAKE 1 TABLET (40 mg)  BY MOUTH TWICE A DAY 08/11/15  Yes Shannon Boys, DO  Polyethyl Glycol-Propyl Glycol 0.4-0.3 % SOLN Place 1 drop into both eyes 4 (four) times daily.    Yes Historical Provider,  MD  polyethylene glycol powder (GLYCOLAX/MIRALAX) powder MIX 17GRAMS IN 8 OUNCES OF LIQUID AND DRINK TWICE DAILY AS NEEDED Patient taking differently: MIX 17GRAMS IN 8 OUNCES OF LIQUID AND DRINK TWICE DAILY AS NEEDED for constipation 09/13/15  Yes Monica Carter, DO  RESTASIS 0.05 % ophthalmic emulsion USE 1 DROP INTO BOTH EYES TWICE DAILY 11/01/15  Yes Kimber Relic, MD  sertraline (ZOLOFT) 100 MG tablet Take one tablet by mouth once daily for depression Patient taking differently: Take 100 mg by mouth daily.  09/03/15  Yes Monica Carter, DO  sodium chloride 1 g tablet TAKE 1 TABLET BY MOUTH THREE TIMES DAILY Patient taking differently: TAKE 1 TABLET (1 g) BY MOUTH THREE TIMES DAILY 09/13/15  Yes Sharon Seller, NP  traMADol (ULTRAM) 50 MG tablet Take 50 mg by mouth daily as needed for moderate pain.    Yes Historical Provider, MD  Water For Irrigation, Sterile (FREE WATER) SOLN Place 60 mLs into feeding tube every 6 (six) hours. 09/30/15  Yes Calvert Cantor, MD  bisacodyl (DULCOLAX) 10 MG suppository Place 1 suppository (10 mg total) rectally daily as needed for moderate constipation. 09/30/15   Calvert Cantor, MD       Physical Exam: BP 150/60   Pulse (!) 56   Temp 97.8 F (36.6 C)   Resp 17   SpO2 100%  General appearance: Thin elderly adult female, alert and in no acute distress.  Constant grimacing and facial twitch, normal per daughter.   Alert  and responding to questions. Eyes: Anicteric, conjunctiva pink, lids and lashes normal.  Right pupil reactive, left post-surgical? ENT: No nasal deformity, discharge, or epistaxis.  OP moist without lesions.  Mostly edentulous. Lymph: No cervical or supraclavicular lymphadenopathy. Skin: Warm and dry.  No suspicious rashes or lesions. Cardiac: Bradycardic, regular, nl S1-S2, no murmurs appreciated although auscultation limited by pads.  Capillary refill is brisk.  JVP normal.  Trace pretibial edema.  Radial and DP pulses 2+ and symmetric. Respiratory: Normal respiratory rate and rhythm.  CTAB without rales or wheezes. Abdomen: Abdomen soft without rigidity but with some reflexive guarding, no TTP that I appreciate.  Large old midline ventral scar. No ascites, distension.   MSK: No deformities or effusions.  Loss of muscle mass diffusely. Neuro: Cranial nerves normal, exam limited by involuntary grimace.  Sensorium intact and responding to questions, attention normal.  Speech is dysarthric at baseline.     Psych: Behavior appropriate.  Affect normal.  No evidence of aural or visual hallucinations or delusions.       Labs on Admission:  I have personally reviewed the following studies: The metabolic panel shows chronic stable renal insufficiency. The complete blood count shows chronic stable anemia. Lipase normal Lactate normal Troponin negative UA unremarkable.      Radiological Exams on Admission: Personally reviewed: Ct Abdomen Pelvis Wo Contrast  Result Date: 11/06/2015 CLINICAL DATA:  Abdominal pain since PEG tube placement on Thursday. EXAM: CT ABDOMEN AND PELVIS WITHOUT CONTRAST TECHNIQUE: Multidetector CT imaging of the abdomen and pelvis was performed following the standard protocol without IV contrast. COMPARISON:  CT abdomen dated 09/11/2015. FINDINGS: Lower chest: Mild bibasilar atelectasis. Small left pleural effusion. Hepatobiliary: Status post cholecystectomy. Small  hypodense lesions within the liver were better demonstrated on the earlier contrast enhanced CT. Pancreas: No mass or inflammatory process identified on this un-enhanced exam. Spleen: Within normal limits in size. Adrenals/Urinary Tract: Kidneys are unremarkable without stone or hydronephrosis. No ureteral or bladder calculi identified. Stomach/Bowel:  Interval resolution of the stomach distension status post percutaneous gastrostomy placement. Enteric tube placed via the gastrostomy site has its tip in the descending duodenum. Surgical small bowel anastomosis noted within the left abdomen, without evidence of obstruction or other surgical complicating feature. No dilated large or small bowel loops. Scattered diverticulosis noted throughout the colon without evidence of acute diverticulitis. Vascular/Lymphatic: Heavy atherosclerotic changes of the normal caliber abdominal aorta and pelvic vasculature. No enlarged lymph nodes seen. Reproductive: No mass or other significant abnormality. Other: No free fluid or abscess collection identified. No free intraperitoneal air. Musculoskeletal: Degenerative changes throughout the thoracolumbar spine. No acute or suspicious osseous finding. Superficial soft tissues are unremarkable. IMPRESSION: 1. Significant reduction in the gastric distention status post gastrostomy, with associated enteric tube passing through the stomach with tip in the descending duodenum. 2. Remainder of the abdomen and pelvis is unremarkable. No small or large bowel obstruction. No free fluid or abscess. No free intraperitoneal air. 3. Colonic diverticulosis without evidence of acute diverticulitis. 4. Aortic atherosclerosis. 5. Small left pleural effusion and mild bibasilar atelectasis. Electronically Signed   By: Bary Richard M.D.   On: 11/06/2015 18:53   Ct Head Wo Contrast  Result Date: 11/06/2015 CLINICAL DATA:  Weakness, low heart rate. EXAM: CT HEAD WITHOUT CONTRAST TECHNIQUE: Contiguous axial  images were obtained from the base of the skull through the vertex without intravenous contrast. COMPARISON:  Head CT dated 03/22/2014 and brain MRI dated 03/23/2014. FINDINGS: Brain: Again noted is generalized age related parenchymal atrophy with commensurate dilatation of the ventricles and sulci. Chronic small vessel ischemic changes again noted within the bilateral periventricular and subcortical white matter regions. There is no mass, hemorrhage, edema or other evidence of acute parenchymal abnormality. No extra-axial hemorrhage. Vascular: No hyperdense vessel or unexpected calcification. There are chronic calcified atherosclerotic changes of the large vessels at the skull base. Skull: Negative for fracture or focal lesion. Sinuses/Orbits: No acute findings. Other: None. IMPRESSION: 1. No acute intracranial abnormality. No intracranial mass, hemorrhage or edema. 2. Atrophy and chronic ischemic changes in the white matter. Electronically Signed   By: Bary Richard M.D.   On: 11/06/2015 18:41   Dg Abd Portable 1v  Result Date: 11/06/2015 CLINICAL DATA:  Generalized abdominal pain for 2 days. EXAM: PORTABLE ABDOMEN - 1 VIEW COMPARISON:  Abdomen x-ray dated 09/12/2015. FINDINGS: Single upright AP view centered at the level of the upper abdomen. Study limited by numerous overlying wires and pacer pads. Visualized bowel gas pattern is nonobstructive. No evidence of free intraperitoneal air seen. Probable small pleural effusions and/or atelectasis at each lung base. IMPRESSION: Limited exam due to numerous overlying, presumed extraneous, wires and pacer pads. Visualized bowel gas pattern is nonobstructive and there is no evidence of free intraperitoneal air seen. Probable small pleural effusions and/or atelectasis at each lung base. Electronically Signed   By: Bary Richard M.D.   On: 11/06/2015 16:52    EKG: Independently reviewed. Rate 61 at first and sinus, then a second ECG with a pause and junctional escape  rhythm taking over at 40 bpm.     Echocardiogram in April 2017: EF 60-65%, grade I diastolic dysfunction.    Assessment/Plan 1. Bradycardia:  Unclear if these are medication induced, some intrinsic disease or vagal episodes. -Stop labetalol -Consult to Cardiology, appreciate cares -Patient is DNR/DNI, but have discussed that medicines or external pacing would be acceptable to patient and family if the patient were unstable overnight -Check TSH and Magnesium   2.  CKD stage III:  Stable  3. Atrial fibrillation:  CHADS2-VASc 5. -Continue amiodarone for now -Continue apixaban, dosed for age and renal function  4. HTN:  -Continue Imdur, amlodipine, hydralazine -Hold labetalol  5. Hypothyroidism:  -Check TSH -Continue levothyroxine  6. SIADH:  -Continue salt tabs  7. Severe protein calorie malnutrion with G-tube:  Has G-tube, but takes medicines by mouth and liquids.   Nutrition through tube. -Continue home Osmolite, Boost  8. Osteoarthritis: -Continue tramadol PRN   DVT prophylaxis: Not needed, on apxiaban  Code Status: Partial, do not CPR or intubate, but do call rapid response, do administer medicines or non-invasive treatments.  Family Communication: Daughter Shannon [name] by phone, can be reached at [phone] mobile  Disposition Plan: Anticipate Cardiology consultation and monitoring on telemetry.  Consults called: Cardiology Admission status: OBS, telemetry   Medical decision making: Patient seen at 10:00 PM on 11/06/2015.  The patient was discussed with Dr. Moody Bruins and Dr. Orson Aloe.  What exists of the patient's chart was reviewed in depth.  Clinical condition: intermittently bradycardic, currently stable.          Alberteen Sam [facility] Pager [phone]

## 2015-11-06 NOTE — ED Notes (Signed)
Pt had brief episode of bradycardia,rate down to 32 with strong pulses, BP down approx 20 points systolic(from140 to [id number]). Pt describes feeling"funny" faint. Episode lasting approx 2-3 minutes.

## 2015-11-06 NOTE — ED Notes (Signed)
Pt stated she does not want contrast in her CT b/c she is concerned the dye will damage her kidneys.  Dr Adela Glimpse notified and stated to cancel the contrast.

## 2015-11-06 NOTE — ED Provider Notes (Signed)
[facility] DEPT Provider Note   CSN: [id number] Arrival date & time: 11/06/15  1546  First Provider Contact:  First MD Initiated Contact with Patient 11/06/15 1548     History   Chief Complaint Chief Complaint  Patient presents with  . Weakness  . Bradycardia    HPI Annajulia Fretwell is a 82 y.o. female.  The history is provided by the patient, the EMS personnel and a relative. No language interpreter was used.  Weakness  This is a new problem. The current episode started 2 days ago. The problem occurs constantly. The problem has been gradually worsening. Associated symptoms include abdominal pain. Pertinent negatives include no chest pain, no headaches and no shortness of breath. The symptoms are aggravated by exertion. The symptoms are relieved by rest. She has tried nothing for the symptoms. The treatment provided no relief.    Past Medical History:  Diagnosis Date  . Anemia    previous blood transfusions  . Arthritis    "all over"  . Asthma   . Bradycardia    topped BB and amiodarone decreased- junctional rhythm   . COPD (chronic obstructive pulmonary disease) ([place])   . Depression    "light case"  . Gastric stenosis   . GERD (gastroesophageal reflux disease)   . History of stomach ulcers   . Hyperlipidemia   . Hypertension   . Hypothyroidism   . Paraesophageal hernia   . Perforated gastric ulcer ([place])   . Pneumonia   . Renal insufficiency   . SIADH (syndrome of inappropriate ADH production) ([place])    [name] 01/10/2015  . Small bowel obstruction ([place])    "I don't know how many" (01/11/2015)  . Stroke ([place])    "light one"  . Type II diabetes mellitus ([place])   . Ventral hernia with bowel obstruction     Patient Active Problem List   Diagnosis Date Noted  . Pyloric stenosis   . Pressure ulcer 09/15/2015  . Hyperkalemia 09/12/2015  . Acute osteomyelitis of toe of right foot ([place])   . PVD (peripheral vascular disease) ([place])   . Emesis   . Adjustment disorder  with anxiety 08/29/2015  . Diabetic ulcer of toe of right foot associated with type 2 diabetes mellitus, with necrosis of muscle ([place])   . Diverticulitis of large intestine without perforation or abscess without bleeding   . Colitis 08/26/2015  . Atrial fibrillation ([place]) 08/26/2015  . Acute on chronic diastolic heart failure ([place]) 08/02/2015  . Esophageal candidiasis ([place])   . Goals of care, counseling/discussion   . Encounter for hospice care discussion   . Dyspnea   . SOB (shortness of breath)   . Acute renal failure superimposed on stage 3 chronic kidney disease ([place])   . Encounter for long-term (current) use of other high-risk medications   . Respiratory distress 07/06/2015  . Atrial flutter with rapid ventricular response ([place]) 07/06/2015  . Dysphagia 07/05/2015  . Asthma with allergic rhinitis 06/04/2015  . Hypertensive heart disease 03/18/2015  . S/P exploratory laparotomy 01/20/15 01/28/2015  . Malnutrition of moderate degree 01/28/2015  . Syncope 01/27/2015  . Aspiration pneumonia ([place]) 01/23/2015  . Fever, unspecified 01/22/2015  . Bradycardia 01/21/2015  . Hypotension 01/21/2015  . Palliative care encounter   . DNR (do not resuscitate) discussion   . Protein-calorie malnutrition, severe 01/12/2015  . Elevated troponin 01/12/2015  . HTN (hypertension), uncontrolled 01/12/2015  . AP (abdominal pain) 01/10/2015  . SBO (small bowel obstruction) ([place]) 01/10/2015  . Cerebrovascular disease  07/14/2014  . Diabetic polyneuropathy associated with type 2 diabetes mellitus ([place]) 07/14/2014  . Hyperlipidemia 07/14/2014  . Gastric outlet obstruction 05/21/2014  . Unstable gait 04/29/2014  . Hyponatremia 03/23/2014  . Acute encephalopathy 03/22/2014  . Slurred speech   . Chest pain   . Gastroparesis   . Slow transit constipation 02/03/2014  . Carotid stenosis 02/03/2014  . Demand ischemia ([place]) 01/21/2014  . Gastric bezoar 01/20/2014  . Anemia of chronic disease 01/18/2014    . CKD (chronic kidney disease) stage 3, GFR 30-59 ml/min 01/18/2014  . Depression 09/29/2013  . Essential hypertension, benign 09/22/2013  . GERD (gastroesophageal reflux disease) 09/22/2013  . Dyslipidemia 09/22/2013  . DM (diabetes mellitus) type II uncontrolled, periph vascular disorder ([place]) 09/22/2013  . A-fib ([place]) 09/22/2013  . CVA (cerebral vascular accident) ([place]) 09/22/2013  . Diabetic neuropathy ([place]) 09/22/2013  . Hypothyroidism 09/22/2013  . Chronic gastrojejunal ulcer with perforation ([place]) 09/07/2013  . Diabetes ([place]) 09/07/2013  . Accelerated hypertension 09/07/2013  . Systemic lupus erythematosus (SLE) inhibitor 09/07/2013  . Other specified abnormal immunological findings in serum 09/07/2013    Past Surgical History:  Procedure Laterality Date  . CATARACT EXTRACTION W/ INTRAOCULAR LENS  IMPLANT, BILATERAL    . CHOLECYSTECTOMY OPEN    . COLECTOMY    . ESOPHAGOGASTRODUODENOSCOPY N/A 01/19/2014   Procedure: ESOPHAGOGASTRODUODENOSCOPY (EGD);  Surgeon: Irene Shipper, MD;  Location: [facility];  Service: Endoscopy;  Laterality: N/A;  . ESOPHAGOGASTRODUODENOSCOPY N/A 01/20/2014   Procedure: ESOPHAGOGASTRODUODENOSCOPY (EGD);  Surgeon: Irene Shipper, MD;  Location: [facility];  Service: Endoscopy;  Laterality: N/A;  . ESOPHAGOGASTRODUODENOSCOPY N/A 03/19/2014   Procedure: ESOPHAGOGASTRODUODENOSCOPY (EGD);  Surgeon: Milus Banister, MD;  Location: [facility];  Service: Endoscopy;  Laterality: N/A;  . ESOPHAGOGASTRODUODENOSCOPY N/A 07/08/2015   Procedure: ESOPHAGOGASTRODUODENOSCOPY (EGD);  Surgeon: Doran Stabler, MD;  Location: [facility] ENDOSCOPY;  Service: Endoscopy;  Laterality: N/A;  . ESOPHAGOGASTRODUODENOSCOPY (EGD) WITH PROPOFOL N/A 09/15/2015   Procedure: ESOPHAGOGASTRODUODENOSCOPY (EGD) WITH PROPOFOL;  Surgeon: Manus Gunning, MD;  Location: [facility] ENDOSCOPY;  Service: Gastroenterology;  Laterality: N/A;  . GASTROJEJUNOSTOMY     hx/notes 01/10/2015  .  GASTROJEJUNOSTOMY N/A 09/23/2015   Procedure: OPEN GASTROJEJUNOSTOMY TUBE PLACEMENT;  Surgeon: Arta Bruce Kinsinger, MD;  Location: [facility] ORS;  Service: General;  Laterality: N/A;  . HERNIA REPAIR  2015  . LAPAROTOMY N/A 01/20/2015   Procedure: EXPLORATORY LAPAROTOMY;  Surgeon: Coralie Keens, MD;  Location: [facility];  Service: General;  Laterality: N/A;  . LYSIS OF ADHESION N/A 01/20/2015   Procedure: LYSIS OF ADHESIONS < 1 HOUR;  Surgeon: Coralie Keens, MD;  Location: [facility];  Service: General;  Laterality: N/A;  . TUBAL LIGATION    . VENTRAL HERNIA REPAIR  2015   incarcerated ventral hernia ([facility] 09/2013)/notes 01/10/2015    OB History    No data available       Home Medications    Prior to Admission medications   Medication Sig Start Date End Date Taking? Authorizing Provider  acetaminophen (TYLENOL) 325 MG tablet Take 650 mg by mouth 2 (two) times daily.    Yes Historical Provider, MD  ADVAIR DISKUS 250-50 MCG/DOSE AEPB INHALE 1 PUFF INTO THE LUNGS 2 (TWO) TIMES DAILY AS NEEDED FOR SHORTNESS OF BREATH 12/01/14  Yes Historical Provider, MD  albuterol (PROVENTIL HFA;VENTOLIN HFA) 108 (90 Base) MCG/ACT inhaler Inhale 2 puffs into the lungs every 6 (six) hours as needed for wheezing or shortness of breath. 06/04/15  Yes Gildardo Cranker, DO  Amino Acids-Protein Hydrolys (FEEDING SUPPLEMENT, PRO-STAT SUGAR FREE 64,) LIQD Place 30 mLs into feeding tube 2 (two) times daily. Patient taking differently: Take 30 mLs by mouth 2 (two) times daily.  09/30/15  Yes Debbe Odea, MD  amiodarone (PACERONE) 100 MG tablet TAKE ONE TABLET BY MOUTH DAILY Patient taking differently: TAKE ONE TABLET 100 mg) BY MOUTH DAILY 11/05/15  Yes Gildardo Cranker, DO  amLODipine (NORVASC) 10 MG tablet TAKE 1 TABLET BY MOUTH DAILY FOR HYPERTENSION Patient taking differently: TAKE 1 TABLET (10 mg) BY MOUTH DAILY FOR HYPERTENSION 08/09/15  Yes Gildardo Cranker, DO  apixaban (ELIQUIS) 2.5 MG TABS tablet Take 1 tablet (2.5 mg total) by mouth  2 (two) times daily. 08/11/15  Yes Gildardo Cranker, DO  atorvastatin (LIPITOR) 10 MG tablet Take one tablet by mouth once daily Patient taking differently: Take 10 mg by mouth daily at 6 PM. Take one tablet by mouth once daily 09/03/15  Yes Gildardo Cranker, DO  b complex vitamins tablet Take 1 tablet by mouth daily.    Yes Historical Provider, MD  calcium-vitamin D (OSCAL WITH D) 500-200 MG-UNIT per tablet Take 1 tablet by mouth daily with breakfast.    Yes Historical Provider, MD  chlorhexidine (PERIDEX) 0.12 % solution Use as directed 15 mLs in the mouth or throat 2 (two) times daily.   Yes Historical Provider, MD  [employer] GAS RELIEF 80 MG chewable tablet CHEW 1 TABLET BY MOUTH 4 TIMES A DAY AS NEEDED FOR FLATULENCE Patient taking differently: CHEW 1 TABLET (80 mg) BY MOUTH 4 TIMES A DAY AS NEEDED FOR FLATULENCE 06/28/15  Yes Gildardo Cranker, DO  diclofenac sodium (VOLTAREN) 1 % GEL Apply 2 g topically daily as needed (for pain).    Yes Historical Provider, MD  feeding supplement (BOOST / RESOURCE BREEZE) LIQD Take 1 Container by mouth 3 (three) times daily between meals. Patient taking differently: Take 0.5-1 Containers by mouth daily.  01/27/15  Yes Shanker Kristeen Mans, MD  fluticasone (FLONASE) 50 MCG/ACT nasal spray Place 2 sprays into both nostrils daily. 08/07/14  Yes Gildardo Cranker, DO  gabapentin (NEURONTIN) 300 MG capsule Take 2 caps po in AM and qhs, take 1 cap daily at [facility] Patient taking differently: Take 300-600 mg by mouth See admin instructions. Take 2 caps po in AM and qhs, take 1 cap daily at [facility] 11/05/15  Yes Gildardo Cranker, DO  hydrALAZINE (APRESOLINE) 50 MG tablet Take 1 tablet (50 mg total) by mouth 4 (four) times daily. 11/05/15  Yes Gildardo Cranker, DO  isosorbide mononitrate (IMDUR) 30 MG 24 hr tablet TAKE 1 TABLET (30 MG TOTAL) BY MOUTH DAILY. 06/22/15  Yes Historical Provider, MD  latanoprost (XALATAN) 0.005 % ophthalmic solution Place 1 drop into both eyes at bedtime. 06/02/14  Yes Blanchie Serve, MD   levothyroxine (SYNTHROID, LEVOTHROID) 100 MCG tablet Take 1 tablet (100 mcg total) by mouth daily before breakfast. 10/11/15  Yes Gildardo Cranker, DO  Nutritional Supplements (FEEDING SUPPLEMENT, OSMOLITE 1.5 CAL,) LIQD Place 1,000 mLs into feeding tube daily. 09/30/15  Yes Debbe Odea, MD  nystatin (MYCOSTATIN/NYSTOP) powder Apply topically 2 (two) times daily. To rash Patient taking differently: Apply topically daily. To rash 10/19/15  Yes Gildardo Cranker, DO  ondansetron (ZOFRAN) 4 MG tablet Take 1 tablet (4 mg total) by mouth every 8 (eight) hours as needed for nausea or vomiting. 09/07/15  Yes [name], NP  pantoprazole (PROTONIX) 40 MG tablet TAKE 1 TABLET BY MOUTH TWICE A DAY Patient taking differently: TAKE  1 TABLET (40 mg)  BY MOUTH TWICE A DAY 08/11/15  Yes Gildardo Cranker, DO  Polyethyl Glycol-Propyl Glycol 0.4-0.3 % SOLN Place 1 drop into both eyes 4 (four) times daily.    Yes Historical Provider, MD  polyethylene glycol powder (GLYCOLAX/MIRALAX) powder MIX 17GRAMS IN 8 OUNCES OF LIQUID AND DRINK TWICE DAILY AS NEEDED Patient taking differently: MIX 17GRAMS IN 8 OUNCES OF LIQUID AND DRINK TWICE DAILY AS NEEDED for constipation 09/13/15  Yes Monica Carter, DO  RESTASIS 0.05 % ophthalmic emulsion USE 1 DROP INTO BOTH EYES TWICE DAILY 11/01/15  Yes Estill Dooms, MD  sertraline (ZOLOFT) 100 MG tablet Take one tablet by mouth once daily for depression Patient taking differently: Take 100 mg by mouth daily.  09/03/15  Yes Monica Carter, DO  sodium chloride 1 g tablet TAKE 1 TABLET BY MOUTH THREE TIMES DAILY Patient taking differently: TAKE 1 TABLET (1 g) BY MOUTH THREE TIMES DAILY 09/13/15  Yes [name], NP  traMADol (ULTRAM) 50 MG tablet Take 50 mg by mouth daily as needed for moderate pain.    Yes Historical Provider, MD  Water For Irrigation, Sterile (FREE WATER) SOLN Place 60 mLs into feeding tube every 6 (six) hours. 09/30/15  Yes Debbe Odea, MD  bisacodyl (DULCOLAX) 10 MG  suppository Place 1 suppository (10 mg total) rectally daily as needed for moderate constipation. 09/30/15   Debbe Odea, MD    Family History Family History  Problem Relation Age of Onset  . Stroke Mother   . Hypertension Mother   . Diabetes Brother   . Heart attack Neg Hx     Social History Social History  Substance Use Topics  . Smoking status: Never Smoker  . Smokeless tobacco: Former Systems developer    Types: Snuff  . Alcohol use No     Allergies   Review of patient's allergies indicates no known allergies.   Review of Systems Review of Systems  Constitutional: Positive for fatigue. Negative for chills and fever.  HENT: Negative for ear pain and sore throat.   Eyes: Negative for pain and visual disturbance.  Respiratory: Negative for cough and shortness of breath.   Cardiovascular: Negative for chest pain and palpitations.  Gastrointestinal: Positive for abdominal pain. Negative for constipation, diarrhea, nausea and vomiting.  Genitourinary: Negative for dysuria and hematuria.  Musculoskeletal: Negative for arthralgias and back pain.  Skin: Negative for color change and rash.  Neurological: Positive for weakness and light-headedness. Negative for seizures, syncope and headaches.  All other systems reviewed and are negative.    Physical Exam Updated Vital Signs BP (!) 155/62 (BP Location: Right Arm)   Pulse (!) 57   Temp 97.4 F (36.3 C) (Oral)   Resp 20   Ht 5\' 6"  (1.676 m)   Wt 65.5 kg   SpO2 100%   BMI 23.29 kg/m   Physical Exam  Constitutional: She appears well-developed and well-nourished. No distress.  HENT:  Head: Normocephalic and atraumatic.  Eyes: Conjunctivae are normal.  Neck: Neck supple.  Cardiovascular: Bradycardia present.   No murmur heard. Pulmonary/Chest: Effort normal and breath sounds normal. No respiratory distress.  Abdominal: Soft. She exhibits distension. There is tenderness. There is no guarding.  Musculoskeletal: She exhibits no  edema.  Neurological: She is alert.  Skin: Skin is warm and dry.  Psychiatric: She has a normal mood and affect.  Nursing note and vitals reviewed.    ED Treatments / Results  Labs (all labs ordered are listed, but  only abnormal results are displayed) Labs Reviewed  COMPREHENSIVE METABOLIC PANEL - Abnormal; Notable for the following:       Result Value   Potassium 5.2 (*)    Glucose, Bld 100 (*)    BUN 50 (*)    Creatinine, Ser 1.66 (*)    GFR calc non Af Amer 26 (*)    GFR calc Af Amer 30 (*)    All other components within normal limits  CBC WITH DIFFERENTIAL/PLATELET - Abnormal; Notable for the following:    RBC 2.77 (*)    Hemoglobin 8.6 (*)    HCT 26.2 (*)    RDW 15.9 (*)    Eosinophils Absolute 1.6 (*)    All other components within normal limits  I-STAT VENOUS BLOOD GAS, ED - Abnormal; Notable for the following:    pH, Ven 7.465 (*)    pCO2, Ven 32.6 (*)    All other components within normal limits  LIPASE, BLOOD  LACTIC ACID, PLASMA  LACTIC ACID, PLASMA  URINALYSIS, ROUTINE W REFLEX MICROSCOPIC (NOT AT [facility])  BLOOD GAS, VENOUS  I-STAT TROPOININ, ED  I-STAT TROPOININ, ED  I-STAT TROPOININ, ED    EKG  EKG Interpretation  Date/Time:  Saturday November 06 2015 16:12:37 EDT Ventricular Rate:  46 PR Interval:    QRS Duration: 82 QT Interval:  489 QTC Calculation: 428 R Axis:   90 Text Interpretation:  Sinus rhythm with periods of with junctional escape Probable left atrial enlargement Anteroseptal infarct, age indeterminate When compared with ECG of EARLIER SAME DATE Junctional escape complexes or rhythm are now Present Confirmed by [facility]  MD, DAVID ([id number]) on 11/06/2015 4:33:01 PM       Radiology Ct Abdomen Pelvis Wo Contrast  Result Date: 11/06/2015 CLINICAL DATA:  Abdominal pain since PEG tube placement on Thursday. EXAM: CT ABDOMEN AND PELVIS WITHOUT CONTRAST TECHNIQUE: Multidetector CT imaging of the abdomen and pelvis was performed following the standard  protocol without IV contrast. COMPARISON:  CT abdomen dated 09/11/2015. FINDINGS: Lower chest: Mild bibasilar atelectasis. Small left pleural effusion. Hepatobiliary: Status post cholecystectomy. Small hypodense lesions within the liver were better demonstrated on the earlier contrast enhanced CT. Pancreas: No mass or inflammatory process identified on this un-enhanced exam. Spleen: Within normal limits in size. Adrenals/Urinary Tract: Kidneys are unremarkable without stone or hydronephrosis. No ureteral or bladder calculi identified. Stomach/Bowel: Interval resolution of the stomach distension status post percutaneous gastrostomy placement. Enteric tube placed via the gastrostomy site has its tip in the descending duodenum. Surgical small bowel anastomosis noted within the left abdomen, without evidence of obstruction or other surgical complicating feature. No dilated large or small bowel loops. Scattered diverticulosis noted throughout the colon without evidence of acute diverticulitis. Vascular/Lymphatic: Heavy atherosclerotic changes of the normal caliber abdominal aorta and pelvic vasculature. No enlarged lymph nodes seen. Reproductive: No mass or other significant abnormality. Other: No free fluid or abscess collection identified. No free intraperitoneal air. Musculoskeletal: Degenerative changes throughout the thoracolumbar spine. No acute or suspicious osseous finding. Superficial soft tissues are unremarkable. IMPRESSION: 1. Significant reduction in the gastric distention status post gastrostomy, with associated enteric tube passing through the stomach with tip in the descending duodenum. 2. Remainder of the abdomen and pelvis is unremarkable. No small or large bowel obstruction. No free fluid or abscess. No free intraperitoneal air. 3. Colonic diverticulosis without evidence of acute diverticulitis. 4. Aortic atherosclerosis. 5. Small left pleural effusion and mild bibasilar atelectasis. Electronically  Signed   By: Franki Cabot  M.D.   On: 11/06/2015 18:53   Ct Head Wo Contrast  Result Date: 11/06/2015 CLINICAL DATA:  Weakness, low heart rate. EXAM: CT HEAD WITHOUT CONTRAST TECHNIQUE: Contiguous axial images were obtained from the base of the skull through the vertex without intravenous contrast. COMPARISON:  Head CT dated 03/22/2014 and brain MRI dated 03/23/2014. FINDINGS: Brain: Again noted is generalized age related parenchymal atrophy with commensurate dilatation of the ventricles and sulci. Chronic small vessel ischemic changes again noted within the bilateral periventricular and subcortical white matter regions. There is no mass, hemorrhage, edema or other evidence of acute parenchymal abnormality. No extra-axial hemorrhage. Vascular: No hyperdense vessel or unexpected calcification. There are chronic calcified atherosclerotic changes of the large vessels at the skull base. Skull: Negative for fracture or focal lesion. Sinuses/Orbits: No acute findings. Other: None. IMPRESSION: 1. No acute intracranial abnormality. No intracranial mass, hemorrhage or edema. 2. Atrophy and chronic ischemic changes in the white matter. Electronically Signed   By: Franki Cabot M.D.   On: 11/06/2015 18:41   Dg Abd Portable 1v  Result Date: 11/06/2015 CLINICAL DATA:  Generalized abdominal pain for 2 days. EXAM: PORTABLE ABDOMEN - 1 VIEW COMPARISON:  Abdomen x-ray dated 09/12/2015. FINDINGS: Single upright AP view centered at the level of the upper abdomen. Study limited by numerous overlying wires and pacer pads. Visualized bowel gas pattern is nonobstructive. No evidence of free intraperitoneal air seen. Probable small pleural effusions and/or atelectasis at each lung base. IMPRESSION: Limited exam due to numerous overlying, presumed extraneous, wires and pacer pads. Visualized bowel gas pattern is nonobstructive and there is no evidence of free intraperitoneal air seen. Probable small pleural effusions and/or  atelectasis at each lung base. Electronically Signed   By: Franki Cabot M.D.   On: 11/06/2015 16:52    Procedures Procedures (including critical care time)  Medications Ordered in ED Medications  amiodarone (PACERONE) tablet 100 mg (not administered)  hydrALAZINE (APRESOLINE) tablet 50 mg (not administered)  nystatin (MYCOSTATIN/NYSTOP) topical powder (not administered)  levothyroxine (SYNTHROID, LEVOTHROID) tablet 100 mcg (not administered)  sodium chloride tablet 1 g (not administered)  feeding supplement (PRO-STAT SUGAR FREE 64) liquid 30 mL (not administered)  bisacodyl (DULCOLAX) suppository 10 mg (not administered)  feeding supplement (OSMOLITE 1.5 CAL) liquid 600 mL (not administered)  free water 60 mL (not administered)  polyethylene glycol (MIRALAX / GLYCOLAX) packet 17 g (not administered)  atorvastatin (LIPITOR) tablet 10 mg (not administered)  sertraline (ZOLOFT) tablet 100 mg (not administered)  pantoprazole (PROTONIX) EC tablet 40 mg (not administered)  apixaban (ELIQUIS) tablet 2.5 mg (not administered)  amLODipine (NORVASC) tablet 10 mg (not administered)  isosorbide mononitrate (IMDUR) 24 hr tablet 30 mg (not administered)  simethicone (MYLICON) chewable tablet 80 mg (not administered)  albuterol (PROVENTIL) (2.5 MG/3ML) 0.083% nebulizer solution 2.5 mg (not administered)  mometasone-formoterol (DULERA) 200-5 MCG/ACT inhaler 2 puff (not administered)  feeding supplement (BOOST / RESOURCE BREEZE) liquid 1 Container (not administered)  traMADol (ULTRAM) tablet 50 mg (not administered)  acetaminophen (TYLENOL) tablet 650 mg (not administered)    Or  acetaminophen (TYLENOL) suppository 650 mg (not administered)  gabapentin (NEURONTIN) tablet 600 mg (not administered)    And  gabapentin (NEURONTIN) capsule 300 mg (not administered)  sodium chloride 0.9 % bolus 1,000 mL (0 mLs Intravenous Stopped 11/06/15 1755)  fentaNYL (SUBLIMAZE) injection 25 mcg (25 mcg Intravenous  Given 11/06/15 1616)  ondansetron (ZOFRAN) injection 4 mg (4 mg Intravenous Given 11/06/15 1615)  iopamidol (ISOVUE-300) 61 % injection (75  mLs  Contrast Given 11/06/15 1802)  fentaNYL (SUBLIMAZE) injection 25 mcg (25 mcg Intravenous Given 11/06/15 1909)     Initial Impression / Assessment and Plan / ED Course  I have reviewed the triage vital signs and the nursing notes.  Pertinent labs & imaging results that were available during my care of the patient were reviewed by me and considered in my medical decision making (see chart for details).  Clinical Course    Patient is a 82 year old female with past medical history of hypertension, diabetes, see EKG, recent feeding tube placement who presents emergency department for evaluation of 2-3 days of generalized weakness, fatigue, lightheadedness, abdominal pain.  Upon arrival patient alert and oriented. She is grimacing in what appears to be pain in her abdomen. Initial EKG shows sinus bradycardia, rate 60, PR interval 176, QTC 471. No acute ischemic changes. Patient's abdomen is distended and diffusely tender. Stat upright abdominal x-ray shows no free air.  Patient placed on monitor, pads in place. She had intermittent bradycardia into the 30s. EKG obtained during bradycardic episode showed a junctional rhythm, rate low 30s, upper 40s.  CT head with no acute findings, CT abdomen/pelvis with no acute findings. Troponin 2 negative, lactate unremarkable. Urinalysis negative for infection.  Through observation in emergency department patient had several episodes of bradycardia into the upper 20s.  Discussed case with cardiologist on call, reviewed EKGs of patient's presentation. Cardiologist agrees with hospitalist admission for symptomatic bradycardia, likely sick sinus syndrome.  Hospitalist paged for admission, patient admitted to telemetry bed.  I discussed case with my attending, Dr. Roxanne Mins.  Final Clinical Impressions(s) / ED Diagnoses    Final diagnoses:  Bradycardia  Weakness    New Prescriptions Current Discharge Medication List       Vira Blanco, MD [id number]    Delora Fuel, MD [id number]

## 2015-11-07 DIAGNOSIS — D638 Anemia in other chronic diseases classified elsewhere: Secondary | ICD-10-CM | POA: Diagnosis not present

## 2015-11-07 DIAGNOSIS — R531 Weakness: Secondary | ICD-10-CM

## 2015-11-07 DIAGNOSIS — R001 Bradycardia, unspecified: Secondary | ICD-10-CM | POA: Diagnosis not present

## 2015-11-07 DIAGNOSIS — N183 Chronic kidney disease, stage 3 (moderate): Secondary | ICD-10-CM | POA: Diagnosis not present

## 2015-11-07 DIAGNOSIS — I455 Other specified heart block: Secondary | ICD-10-CM

## 2015-11-07 DIAGNOSIS — I482 Chronic atrial fibrillation: Secondary | ICD-10-CM | POA: Diagnosis not present

## 2015-11-07 NOTE — Evaluation (Signed)
Clinical/Bedside Swallow Evaluation Patient Details  Name: Shannon [name] MRN: [id number] Date of Birth: 22-May-1923  Today's Date: 11/07/2015 Time: SLP Start Time (ACUTE ONLY): 1145 SLP Stop Time (ACUTE ONLY): 1200 SLP Time Calculation (min) (ACUTE ONLY): 15 min  Past Medical History:  Past Medical History:  Diagnosis Date  . Anemia    previous blood transfusions  . Arthritis    "all over"  . Asthma   . Bradycardia    topped BB and amiodarone decreased- junctional rhythm   . COPD (chronic obstructive pulmonary disease) (HCC)   . Depression    "light case"  . Gastric stenosis   . GERD (gastroesophageal reflux disease)   . History of stomach ulcers   . Hyperlipidemia   . Hypertension   . Hypothyroidism   . Paraesophageal hernia   . Perforated gastric ulcer (HCC)   . Pneumonia   . Renal insufficiency   . SIADH (syndrome of inappropriate ADH production) (HCC)    Shannon [name] 01/10/2015  . Small bowel obstruction (HCC)    "I don't know how many" (01/11/2015)  . Stroke (HCC)    "light one"  . Type II diabetes mellitus (HCC)   . Ventral hernia with bowel obstruction    Past Surgical History:  Past Surgical History:  Procedure Laterality Date  . CATARACT EXTRACTION W/ INTRAOCULAR LENS  IMPLANT, BILATERAL    . CHOLECYSTECTOMY OPEN    . COLECTOMY    . ESOPHAGOGASTRODUODENOSCOPY N/A 01/19/2014   Procedure: ESOPHAGOGASTRODUODENOSCOPY (EGD);  Surgeon: Hilarie Fredrickson, MD;  Location: [facility];  Service: Endoscopy;  Laterality: N/A;  . ESOPHAGOGASTRODUODENOSCOPY N/A 01/20/2014   Procedure: ESOPHAGOGASTRODUODENOSCOPY (EGD);  Surgeon: Hilarie Fredrickson, MD;  Location: [facility];  Service: Endoscopy;  Laterality: N/A;  . ESOPHAGOGASTRODUODENOSCOPY N/A 03/19/2014   Procedure: ESOPHAGOGASTRODUODENOSCOPY (EGD);  Surgeon: Rachael Fee, MD;  Location: [facility];  Service: Endoscopy;  Laterality: N/A;  . ESOPHAGOGASTRODUODENOSCOPY N/A 07/08/2015   Procedure: ESOPHAGOGASTRODUODENOSCOPY (EGD);   Surgeon: Sherrilyn Rist, MD;  Location: [facility]COPY;  Service: Endoscopy;  Laterality: N/A;  . ESOPHAGOGASTRODUODENOSCOPY (EGD) WITH PROPOFOL N/A 09/15/2015   Procedure: ESOPHAGOGASTRODUODENOSCOPY (EGD) WITH PROPOFOL;  Surgeon: Ruffin Frederick, MD;  Location: [facility] ENDOSCOPY;  Service: Gastroenterology;  Laterality: N/A;  . GASTROJEJUNOSTOMY     hx/notes 01/10/2015  . GASTROJEJUNOSTOMY N/A 09/23/2015   Procedure: OPEN GASTROJEJUNOSTOMY TUBE PLACEMENT;  Surgeon: De Blanch Kinsinger, MD;  Location: [facility] ORS;  Service: General;  Laterality: N/A;  . HERNIA REPAIR  2015  . LAPAROTOMY N/A 01/20/2015   Procedure: EXPLORATORY LAPAROTOMY;  Surgeon: Abigail Miyamoto, MD;  Location: [facility] OR;  Service: General;  Laterality: N/A;  . LYSIS OF ADHESION N/A 01/20/2015   Procedure: LYSIS OF ADHESIONS < 1 HOUR;  Surgeon: Abigail Miyamoto, MD;  Location: [facility] OR;  Service: General;  Laterality: N/A;  . TUBAL LIGATION    . VENTRAL HERNIA REPAIR  2015   incarcerated ventral hernia ([facility] 09/2013)/notes 01/10/2015   HPI:  Pt is a 82 y.o. female with PMH of Afib on apixaban and amiodarone, remote CVA, HTN, SIADH, hypothyroidism, anemia, CKD, and gastric outlet obstruction with recent PEG tube who presented to ED on 8/5 with weak spells and bradycardia, increasing in frequency, along with transient R side weakness. Reportedly has G-tube but takes meds and liquids by mouth. Pt has been followed by SLP on previous admissions- most recent recommendation in 2016 was full liquid (nectar) allowing tsp of thin liquid sips as well. Head CT clear, abdominal CT showed significant reduction in  the gastric distention status postgastrostomy, with associated enteric tube passing through the stomach with tip in the descending duodenum; small L pleural effusion and mild bibasilar atelectasis. Bedside swallow eval ordered.   Assessment / Plan / Recommendation Clinical Impression  Pt with no overt s/s of aspiration at bedside. Pt reports  having occasional difficulty with both liquids and solids at home and states needing to "take it slow", sometimes experiencing globus sensation with puree constency. Pt was also slightly SOB during evaluation, with hoarse/ strained vocal quality- reports this is baseline. Pt reports having tube feeding at night only and consumes thin liquids/ puree consistencies during the day. Also reports needing to crush large meds. Pt with multiple swallows occasionally with puree consistency, questionable for pharyngeal residuals. Aspiration risk appears mild at this time with full liquid diet. Recommend continuing full liquid, meds crushed in puree, intermittent supervision to cue pt to take small bites/ sips and that pt is seated upright 30-60 minutes after meal. Will continue to follow for diet tolerance and education, consider whether need for objective assessment.    Aspiration Risk  Mild aspiration risk;Moderate aspiration risk    Diet Recommendation Thin liquid (full liquid)   Liquid Administration via: Straw Medication Administration: Crushed with puree Supervision: Patient able to self feed;Intermittent supervision to cue for compensatory strategies Compensations: Slow rate;Small sips/bites Postural Changes: Seated upright at 90 degrees;Remain upright for at least 30 minutes after po intake    Other  Recommendations Oral Care Recommendations: Oral care BID Other Recommendations: Clarify dietary restrictions   Follow up Recommendations  24 hour supervision/assistance    Frequency and Duration min 2x/week  1 week       Prognosis Prognosis for Safe Diet Advancement: Guarded      Swallow Study   General HPI: Pt is a 82 y.o. female with PMH of Afib on apixaban and amiodarone, remote CVA, HTN, SIADH, hypothyroidism, anemia, CKD, and gastric outlet obstruction with recent PEG tube who presented to ED on 8/5 with weak spells and bradycardia, increasing in frequency, along with transient R side  weakness. Reportedly has G-tube but takes meds and liquids by mouth. Pt has been followed by SLP on previous admissions- most recent recommendation in 2016 was full liquid (nectar) allowing tsp of thin liquid sips as well. Head CT clear, abdominal CT showed significant reduction in the gastric distention status postgastrostomy, with associated enteric tube passing through the stomach with tip in the descending duodenum; small L pleural effusion and mild bibasilar atelectasis. Bedside swallow eval ordered. Type of Study: Bedside Swallow Evaluation Previous Swallow Assessment: 2016- rec'd D1/ thin Diet Prior to this Study: Thin liquids ("diet soft") Temperature Spikes Noted: No Respiratory Status: Room air History of Recent Intubation: No Behavior/Cognition: Alert;Cooperative;Pleasant mood Oral Cavity Assessment: Within Functional Limits Oral Care Completed by SLP: No Vision: Functional for self-feeding Self-Feeding Abilities: Able to feed self Patient Positioning: Upright in bed Baseline Vocal Quality: Breathy;Hoarse Volitional Cough: Strong    Oral/Motor/Sensory Function Overall Oral Motor/Sensory Function: Within functional limits   Ice Chips Ice chips: Not tested   Thin Liquid Thin Liquid: Within functional limits Presentation: Straw    Nectar Thick Nectar Thick Liquid: Not tested   Honey Thick Honey Thick Liquid: Not tested   Puree Puree: Impaired Presentation: Self Fed;Spoon Pharyngeal Phase Impairments: Multiple swallows   Solid   GO   Solid: Not tested    Functional Assessment Tool Used: clinical judgment Functional Limitations: Swallowing Swallow Current Status (M8413): At least 20 percent but  less than 40 percent impaired, limited or restricted Swallow Goal Status [id number]): At least 1 percent but less than 20 percent impaired, limited or restricted   Thana Ates K, MA, CCC-SLP 11/07/2015,1:28 PM [phone]

## 2015-11-07 NOTE — Progress Notes (Signed)
[facility] PROGRESS NOTE    Progress Note  Shannon [name]  ZOX:[id number] DOB: 11-13-1923 DOA: 11/06/2015 PCP: Kirt Boys, DO     Brief Narrative:   Shannon [name] is an 82 y.o. female past medical history of A. fib and apixiban, amiodarone, essential hypertension, normocytic anemia chronic and kidney disease with a recent PEG tube due to gastric outlet obstruction comes in for several spells of weakness and sweating in the last 2 or 3 days and was found to be severely bradycardic in the ED.,   Assessment/Plan:   Bradycardia Likely due to medication, her labetalol will stop appreciate cardiology's assistance. TSH and magnesium are pending, the patient is DO NOT RESUSCITATE. She has been asymptomatic, her heart rate has come up on telemetry with no significant positives more than 3 seconds.  Essential hypertension, benign Labetalol was stopped, blood pressure seems to be at goal. Continue Imdur, amlodipine and hydralazine.  Chronic  A-fib (HCC) Rate controlled, continuous Xarelto. Continue amiodarone  Hypothyroidism Continue Synthroid. Anemia of chronic disease  CKD (chronic kidney disease) stage 3, GFR 30-59 ml/min At baseline.  His SIADH: Continue salt tablets.  DVT prophylaxis: lovenox Family Communication:none Disposition Plan/Barrier to D/C: unable to detemrine Code Status:     Code Status Orders        Start     Ordered   11/06/15 2259  Limited resuscitation (code)  Continuous    Question Answer Comment  In the event of cardiac or respiratory ARREST: Initiate Code Blue, Call Rapid Response Yes   In the event of cardiac or respiratory ARREST: Perform CPR No   In the event of cardiac or respiratory ARREST: Perform Intubation/Mechanical Ventilation No   In the event of cardiac or respiratory ARREST: Use NIPPV/BiPAp only if indicated Yes   In the event of cardiac or respiratory ARREST: Administer ACLS medications if indicated Yes   In the event of cardiac  or respiratory ARREST: Perform Defibrillation or Cardioversion if indicated No      11/06/15 2300    Code Status History    Date Active Date Inactive Code Status Order ID Comments User Context   09/12/2015  4:11 AM 09/30/2015  5:19 PM Partial Code [id number]  Briscoe Deutscher, MD Inpatient   08/26/2015  7:18 PM 09/05/2015  6:24 PM Partial Code [id number]  Meredith Pel, NP Inpatient   08/26/2015  3:27 PM 08/26/2015  7:18 PM Full Code [id number]  Meredith Pel, NP ED   08/02/2015  4:03 PM 08/05/2015  5:31 PM Partial Code [id number]  Abelino Derrick, PA-C ED   07/06/2015 11:44 AM 07/11/2015  4:36 PM Partial Code [id number]  Vilinda Blanks Minor, NP Inpatient   07/05/2015  9:18 PM 07/06/2015 11:44 AM Full Code [id number]  Hillary Bow, DO ED   01/28/2015 12:47 AM 01/29/2015  5:32 PM DNR [id number]  Rolly Salter, MD Inpatient   01/14/2015  2:09 PM 01/27/2015  9:48 PM DNR [id number]  Katheran Awe, NP Inpatient   01/10/2015  5:01 AM 01/14/2015  2:09 PM Full Code [id number]  Hillary Bow, DO ED   05/21/2014 11:24 PM 05/30/2014  6:29 PM DNR [id number]  Hillary Bow, DO ED   03/27/2014  5:33 PM 03/30/2014  6:26 PM DNR [id number]  Huey Bienenstock, MD Inpatient   03/13/2014  2:26 AM 03/26/2014  4:31 PM Full Code [id number]  Eduard Clos, MD Inpatient   01/18/2014  1:50 AM 01/22/2014  5:08 PM Full Code [id number]  Pearson Grippe,  MD Inpatient   12/23/2013 11:11 PM 12/26/2013 10:18 PM Full Code [id number]  Haydee Monica, MD Inpatient    Advance Directive Documentation   Flowsheet Row Most Recent Value  Type of Advance Directive  Healthcare Power of Attorney  Pre-existing out of facility DNR order (yellow form or pink MOST form)  No data  "MOST" Form in Place?  No data        IV Access:    Peripheral IV   Procedures and diagnostic studies:   Ct Abdomen Pelvis Wo Contrast  Result Date: 11/06/2015 CLINICAL DATA:  Abdominal pain since PEG tube placement on Thursday. EXAM: CT ABDOMEN AND PELVIS WITHOUT CONTRAST  TECHNIQUE: Multidetector CT imaging of the abdomen and pelvis was performed following the standard protocol without IV contrast. COMPARISON:  CT abdomen dated 09/11/2015. FINDINGS: Lower chest: Mild bibasilar atelectasis. Small left pleural effusion. Hepatobiliary: Status post cholecystectomy. Small hypodense lesions within the liver were better demonstrated on the earlier contrast enhanced CT. Pancreas: No mass or inflammatory process identified on this un-enhanced exam. Spleen: Within normal limits in size. Adrenals/Urinary Tract: Kidneys are unremarkable without stone or hydronephrosis. No ureteral or bladder calculi identified. Stomach/Bowel: Interval resolution of the stomach distension status post percutaneous gastrostomy placement. Enteric tube placed via the gastrostomy site has its tip in the descending duodenum. Surgical small bowel anastomosis noted within the left abdomen, without evidence of obstruction or other surgical complicating feature. No dilated large or small bowel loops. Scattered diverticulosis noted throughout the colon without evidence of acute diverticulitis. Vascular/Lymphatic: Heavy atherosclerotic changes of the normal caliber abdominal aorta and pelvic vasculature. No enlarged lymph nodes seen. Reproductive: No mass or other significant abnormality. Other: No free fluid or abscess collection identified. No free intraperitoneal air. Musculoskeletal: Degenerative changes throughout the thoracolumbar spine. No acute or suspicious osseous finding. Superficial soft tissues are unremarkable. IMPRESSION: 1. Significant reduction in the gastric distention status post gastrostomy, with associated enteric tube passing through the stomach with tip in the descending duodenum. 2. Remainder of the abdomen and pelvis is unremarkable. No small or large bowel obstruction. No free fluid or abscess. No free intraperitoneal air. 3. Colonic diverticulosis without evidence of acute diverticulitis. 4. Aortic  atherosclerosis. 5. Small left pleural effusion and mild bibasilar atelectasis. Electronically Signed   By: Bary Richard M.D.   On: 11/06/2015 18:53   Ct Head Wo Contrast  Result Date: 11/06/2015 CLINICAL DATA:  Weakness, low heart rate. EXAM: CT HEAD WITHOUT CONTRAST TECHNIQUE: Contiguous axial images were obtained from the base of the skull through the vertex without intravenous contrast. COMPARISON:  Head CT dated 03/22/2014 and brain MRI dated 03/23/2014. FINDINGS: Brain: Again noted is generalized age related parenchymal atrophy with commensurate dilatation of the ventricles and sulci. Chronic small vessel ischemic changes again noted within the bilateral periventricular and subcortical white matter regions. There is no mass, hemorrhage, edema or other evidence of acute parenchymal abnormality. No extra-axial hemorrhage. Vascular: No hyperdense vessel or unexpected calcification. There are chronic calcified atherosclerotic changes of the large vessels at the skull base. Skull: Negative for fracture or focal lesion. Sinuses/Orbits: No acute findings. Other: None. IMPRESSION: 1. No acute intracranial abnormality. No intracranial mass, hemorrhage or edema. 2. Atrophy and chronic ischemic changes in the white matter. Electronically Signed   By: Bary Richard M.D.   On: 11/06/2015 18:41   Dg Abd Portable 1v  Result Date: 11/06/2015 CLINICAL DATA:  Generalized abdominal pain for 2 days. EXAM: PORTABLE ABDOMEN - 1 VIEW COMPARISON:  Abdomen x-ray dated 09/12/2015. FINDINGS: Single upright AP view centered at the level of the upper abdomen. Study limited by numerous overlying wires and pacer pads. Visualized bowel gas pattern is nonobstructive. No evidence of free intraperitoneal air seen. Probable small pleural effusions and/or atelectasis at each lung base. IMPRESSION: Limited exam due to numerous overlying, presumed extraneous, wires and pacer pads. Visualized bowel gas pattern is nonobstructive and there is  no evidence of free intraperitoneal air seen. Probable small pleural effusions and/or atelectasis at each lung base. Electronically Signed   By: Bary Richard M.D.   On: 11/06/2015 16:52     Medical Consultants:    None.  Anti-Infectives:   none  Subjective:    [name] She will she hasn't had any episodes during her hospital stay. She feels great tolerating her diet.   Objective:    Vitals:   11/06/15 2215 11/06/15 2258 11/07/15 0443 11/07/15 0757  BP: 150/60 (!) 155/62 (!) 128/55   Pulse: (!) 56 (!) 57 (!) 56   Resp:  20 18   Temp:  97.4 F (36.3 C) 97.7 F (36.5 C)   TempSrc:  Oral Oral   SpO2: 100% 100% 99% 99%  Weight:  65.5 kg (144 lb 4.8 oz)    Height:  5\' 6"  (1.676 m)      Intake/Output Summary (Last 24 hours) at 11/07/15 0805 Last data filed at 11/07/15 0865  Gross per 24 hour  Intake              320 ml  Output              600 ml  Net             -280 ml   Filed Weights   11/06/15 2258  Weight: 65.5 kg (144 lb 4.8 oz)    Exam: General exam: In no acute distress.Cachectic appearing  Respiratory system: Good air movement and clear to auscultation. Cardiovascular system: S1 & S2 heard, RRR.  Gastrointestinal system: Abdomen is nondistended, soft and nontender.  PEG tube in place. Nontender.  Central nervous system: Alert and oriented. No focal neurological deficits. Extremities: No pedal edema. Skin: No rashes, lesions or ulcers Psychiatry: Judgement and insight appear normal. Mood & affect appropriate.    Data Reviewed:    Labs: Basic Metabolic Panel:  Recent Labs Lab 11/06/15 1612  NA 135  K 5.2*  CL 103  CO2 23  GLUCOSE 100*  BUN 50*  CREATININE 1.66*  CALCIUM 9.8   GFR Estimated Creatinine Clearance: 21.1 mL/min (by C-G formula based on SCr of 1.66 mg/dL). Liver Function Tests:  Recent Labs Lab 11/06/15 1612  AST 26  ALT 23  ALKPHOS 49  BILITOT 0.5  PROT 7.6  ALBUMIN 3.6    Recent Labs Lab 11/06/15 1612    LIPASE 21   No results for input(s): AMMONIA in the last 168 hours. Coagulation profile No results for input(s): INR, PROTIME in the last 168 hours.  CBC:  Recent Labs Lab 11/06/15 1612  WBC 9.8  NEUTROABS 6.3  HGB 8.6*  HCT 26.2*  MCV 94.6  PLT 305   Cardiac Enzymes: No results for input(s): CKTOTAL, CKMB, CKMBINDEX, TROPONINI in the last 168 hours. BNP (last 3 results) No results for input(s): PROBNP in the last 8760 hours. CBG: No results for input(s): GLUCAP in the last 168 hours. D-Dimer: No results for input(s): DDIMER in the last 72 hours. Hgb A1c: No results for input(s): HGBA1C in the  last 72 hours. Lipid Profile: No results for input(s): CHOL, HDL, LDLCALC, TRIG, CHOLHDL, LDLDIRECT in the last 72 hours. Thyroid function studies: No results for input(s): TSH, T4TOTAL, T3FREE, THYROIDAB in the last 72 hours.  Invalid input(s): FREET3 Anemia work up: No results for input(s): VITAMINB12, FOLATE, FERRITIN, TIBC, IRON, RETICCTPCT in the last 72 hours. Sepsis Labs:  Recent Labs Lab 11/06/15 1612 11/06/15 1853  WBC 9.8  --   LATICACIDVEN 1.2 0.9   Microbiology No results found for this or any previous visit (from the past 240 hour(s)).   Medications:   . amiodarone  100 mg Oral Daily  . amLODipine  10 mg Oral Daily  . apixaban  2.5 mg Oral BID  . atorvastatin  10 mg Oral q1800  . feeding supplement  1 Container Oral TID BM  . feeding supplement (OSMOLITE 1.5 CAL)  600 mL Per Tube Q2000  . feeding supplement (PRO-STAT SUGAR FREE 64)  30 mL Oral BID  . free water  60 mL Per Tube Q6H  . gabapentin  600 mg Oral BID   And  . gabapentin  300 mg Oral Q1200  . hydrALAZINE  50 mg Oral TID AC & HS  . isosorbide mononitrate  30 mg Oral Daily  . levothyroxine  100 mcg Oral QAC breakfast  . mometasone-formoterol  2 puff Inhalation BID  . nystatin   Topical Daily  . pantoprazole  40 mg Oral BID  . sertraline  100 mg Oral Daily  . sodium chloride  1 g Oral  TID WC   Continuous Infusions:   Time spent: 25 min   LOS: 0 days   Marinda Elk  [facility] Pager [phone]  *Please refer to [email or web address], password [email or web address] to get updated schedule on who will round on this patient, as hospitalists switch teams weekly. If 7PM-7AM, please contact night-coverage at [email or web address], password [email or web address] for any overnight needs.  11/07/2015, 8:05 AM

## 2015-11-07 NOTE — Progress Notes (Signed)
PATIENT ARRIVED TO UNIT 2W FROM ED VIA STRETCHER. TRANSFERRED TO BED BY STAFF. TELE APPLIED. VITALS AND ASSESSMENT COMPLETED.  BED LOCKED AND IN LOWEST POSITION. PATIENT ORIENTED TO UNIT AND EQUIPMENT. INSTRUCTED TO CALL FOR ASSISTANCE WHEN NEEDED.

## 2015-11-07 NOTE — Discharge Instructions (Signed)
Information on my medicine - ELIQUIS (apixaban)  This medication education was reviewed with me or my healthcare representative as part of my discharge preparation.  The pharmacist that spoke with me during my hospital stay was:  Neoma Uhrich T Cedrick Partain, RPH  Why was Eliquis prescribed for you? Eliquis was prescribed for you to reduce the risk of a blood clot forming that can cause a stroke if you have a medical condition called atrial fibrillation (a type of irregular heartbeat).  What do You need to know about Eliquis ? Take your Eliquis TWICE DAILY - one tablet in the morning and one tablet in the evening with or without food. If you have difficulty swallowing the tablet whole please discuss with your pharmacist how to take the medication safely.  Take Eliquis exactly as prescribed by your doctor and DO NOT stop taking Eliquis without talking to the doctor who prescribed the medication.  Stopping may increase your risk of developing a stroke.  Refill your prescription before you run out.  After discharge, you should have regular check-up appointments with your healthcare provider that is prescribing your Eliquis.  In the future your dose may need to be changed if your kidney function or weight changes by a significant amount or as you get older.  What do you do if you miss a dose? If you miss a dose, take it as soon as you remember on the same day and resume taking twice daily.  Do not take more than one dose of ELIQUIS at the same time to make up a missed dose.  Important Safety Information A possible side effect of Eliquis is bleeding. You should call your healthcare provider right away if you experience any of the following: Bleeding from an injury or your nose that does not stop. Unusual colored urine (red or dark brown) or unusual colored stools (red or black). Unusual bruising for unknown reasons. A serious fall or if you hit your head (even if there is no bleeding).  Some  medicines may interact with Eliquis and might increase your risk of bleeding or clotting while on Eliquis. To help avoid this, consult your healthcare provider or pharmacist prior to using any new prescription or non-prescription medications, including herbals, vitamins, non-steroidal anti-inflammatory drugs (NSAIDs) and supplements.  This website has more information on Eliquis (apixaban): http://www.eliquis.com/eliquis/home  

## 2015-11-08 ENCOUNTER — Other Ambulatory Visit: Payer: Medicare Other

## 2015-11-08 ENCOUNTER — Observation Stay (HOSPITAL_COMMUNITY): Payer: Medicare Other

## 2015-11-08 DIAGNOSIS — I10 Essential (primary) hypertension: Secondary | ICD-10-CM | POA: Diagnosis not present

## 2015-11-08 DIAGNOSIS — I480 Paroxysmal atrial fibrillation: Secondary | ICD-10-CM

## 2015-11-08 DIAGNOSIS — R0602 Shortness of breath: Secondary | ICD-10-CM | POA: Diagnosis not present

## 2015-11-08 DIAGNOSIS — N183 Chronic kidney disease, stage 3 (moderate): Secondary | ICD-10-CM | POA: Diagnosis not present

## 2015-11-08 DIAGNOSIS — I482 Chronic atrial fibrillation: Secondary | ICD-10-CM | POA: Diagnosis not present

## 2015-11-08 DIAGNOSIS — E032 Hypothyroidism due to medicaments and other exogenous substances: Secondary | ICD-10-CM

## 2015-11-08 DIAGNOSIS — D638 Anemia in other chronic diseases classified elsewhere: Secondary | ICD-10-CM | POA: Diagnosis not present

## 2015-11-08 DIAGNOSIS — R001 Bradycardia, unspecified: Secondary | ICD-10-CM | POA: Diagnosis not present

## 2015-11-08 LAB — BASIC METABOLIC PANEL
Anion gap: 11 (ref 5–15)
BUN: 36 mg/dL — ABNORMAL HIGH (ref 6–20)
CO2: 19 mmol/L — ABNORMAL LOW (ref 22–32)
Calcium: 9.5 mg/dL (ref 8.9–10.3)
Chloride: 104 mmol/L (ref 101–111)
Creatinine, Ser: 1.39 mg/dL — ABNORMAL HIGH (ref 0.44–1.00)
GFR calc Af Amer: 37 mL/min — ABNORMAL LOW (ref >60)
GFR calc non Af Amer: 32 mL/min — ABNORMAL LOW (ref >60)
Glucose, Bld: 122 mg/dL — ABNORMAL HIGH (ref 65–99)
Potassium: 4.8 mmol/L (ref 3.5–5.1)
Sodium: 134 mmol/L — ABNORMAL LOW (ref 135–145)

## 2015-11-08 LAB — TSH: TSH: 51.089 u[IU]/mL — ABNORMAL HIGH (ref 0.350–4.500)

## 2015-11-08 LAB — T4, FREE: Free T4: 0.73 ng/dL (ref 0.61–1.12)

## 2015-11-08 MED ORDER — LEVOTHYROXINE SODIUM 75 MCG PO TABS: 150.0000 ug | ORAL_TABLET | Freq: Every day | ORAL | Status: DC

## 2015-11-08 MED ORDER — OSMOLITE 1.5 CAL PO LIQD
1000.0000 mL | Freq: Every day | ORAL | Status: DC
  Administered 2015-11-08 – 2015-11-10 (×3): 1000 mL
  Filled 2015-11-08 (×4): qty 1000

## 2015-11-08 MED ORDER — LEVOTHYROXINE SODIUM 25 MCG PO TABS
125.0000 ug | ORAL_TABLET | Freq: Every day | ORAL | Status: DC
  Administered 2015-11-09 – 2015-11-11 (×3): 125 ug via ORAL
  Filled 2015-11-08 (×3): qty 1

## 2015-11-08 MED ORDER — BOOST / RESOURCE BREEZE PO LIQD
1.0000 | Freq: Every day | ORAL | Status: DC
  Administered 2015-11-09 – 2015-11-10 (×2): 1 via ORAL

## 2015-11-08 NOTE — Progress Notes (Addendum)
[facility] PROGRESS NOTE    Progress Note  Lauron Offenbacher  BMW:[id number] DOB: [birth year] DOA: 11/06/2015 PCP: Kirt Boys, DO     Brief Narrative:   Shannon [name] is an 82 y.o. female past medical history of A. fib and apixiban, amiodarone, essential hypertension, normocytic anemia chronic and kidney disease with a recent PEG tube due to gastric outlet obstruction comes in for several spells of weakness and sweating in the last 2 or 3 days and was found to be severely bradycardic in the ED.,   Assessment/Plan:   Bradycardia Likely due to medication- holding labetalol -  appreciate cardiology's assistance. TSH and magnesium are pending, the patient is DO NOT RESUSCITATE. She has been asymptomatic, her heart rate has come up on telemetry with no significant positives more than 3 seconds.  Dizziness - complains of this while she is laying still in the bed. Continued to complain of it when sitting up in the chair - orthostatic vitals negative - no abnormalities on telemetry - no associated symptoms- no other neurological symptoms  Essential hypertension, benign Labetalol was stopped due to bradycardia Continue Imdur, amlodipine and hydralazine.  Chronic  A-fib (HCC) Rate controlled, continuous Xarelto. Continue amiodarone  Hypothyroidism - TSH quite high although F T 4 normal- will increase synthroid from 100 to 150 mcg  CKD (chronic kidney disease) stage 3, GFR 30-59 ml/min At baseline.   SIADH: Continue salt tablets.  DVT prophylaxis: lovenox Family Communication:none Disposition Plan/Barrier to D/C: unable to detemrine Code Status:     Code Status Orders        Start     Ordered   11/06/15 2259  Limited resuscitation (code)  Continuous    Question Answer Comment  In the event of cardiac or respiratory ARREST: Initiate Code Blue, Call Rapid Response Yes   In the event of cardiac or respiratory ARREST: Perform CPR No   In the event of cardiac or respiratory  ARREST: Perform Intubation/Mechanical Ventilation No   In the event of cardiac or respiratory ARREST: Use NIPPV/BiPAp only if indicated Yes   In the event of cardiac or respiratory ARREST: Administer ACLS medications if indicated Yes   In the event of cardiac or respiratory ARREST: Perform Defibrillation or Cardioversion if indicated No      11/06/15 2300    Code Status History    Date Active Date Inactive Code Status Order ID Comments User Context   09/12/2015  4:11 AM 09/30/2015  5:19 PM Partial Code [id number]  Briscoe Deutscher, MD Inpatient   08/26/2015  7:18 PM 09/05/2015  6:24 PM Partial Code [id number]  Meredith Pel, NP Inpatient   08/26/2015  3:27 PM 08/26/2015  7:18 PM Full Code [id number]  Meredith Pel, NP ED   08/02/2015  4:03 PM 08/05/2015  5:31 PM Partial Code [id number]  Abelino Derrick, PA-C ED   07/06/2015 11:44 AM 07/11/2015  4:36 PM Partial Code [id number]  Vilinda Blanks Minor, NP Inpatient   07/05/2015  9:18 PM 07/06/2015 11:44 AM Full Code [id number]  Hillary Bow, DO ED   01/28/2015 12:47 AM 01/29/2015  5:32 PM DNR [id number]  Rolly Salter, MD Inpatient   01/14/2015  2:09 PM 01/27/2015  9:48 PM DNR [id number]  Katheran Awe, NP Inpatient   01/10/2015  5:01 AM 01/14/2015  2:09 PM Full Code [id number]  Hillary Bow, DO ED   05/21/2014 11:24 PM 05/30/2014  6:29 PM DNR [id number]  Hillary Bow, DO ED   03/27/2014  5:33 PM 03/30/2014  6:26 PM DNR [id number]  Huey Bienenstock, MD Inpatient   03/13/2014  2:26 AM 03/26/2014  4:31 PM Full Code [id number]  Eduard Clos, MD Inpatient   01/18/2014  1:50 AM 01/22/2014  5:08 PM Full Code [id number]  Pearson Grippe, MD Inpatient   12/23/2013 11:11 PM 12/26/2013 10:18 PM Full Code [id number]  Haydee Monica, MD Inpatient    Advance Directive Documentation   Flowsheet Row Most Recent Value  Type of Advance Directive  Healthcare Power of Attorney  Pre-existing out of facility DNR order (yellow form or pink MOST form)  No data  "MOST" Form in Place?  No data          IV Access:    Peripheral IV   Procedures and diagnostic studies:   Ct Abdomen Pelvis Wo Contrast  Result Date: 11/06/2015 CLINICAL DATA:  Abdominal pain since PEG tube placement on Thursday. EXAM: CT ABDOMEN AND PELVIS WITHOUT CONTRAST TECHNIQUE: Multidetector CT imaging of the abdomen and pelvis was performed following the standard protocol without IV contrast. COMPARISON:  CT abdomen dated 09/11/2015. FINDINGS: Lower chest: Mild bibasilar atelectasis. Small left pleural effusion. Hepatobiliary: Status post cholecystectomy. Small hypodense lesions within the liver were better demonstrated on the earlier contrast enhanced CT. Pancreas: No mass or inflammatory process identified on this un-enhanced exam. Spleen: Within normal limits in size. Adrenals/Urinary Tract: Kidneys are unremarkable without stone or hydronephrosis. No ureteral or bladder calculi identified. Stomach/Bowel: Interval resolution of the stomach distension status post percutaneous gastrostomy placement. Enteric tube placed via the gastrostomy site has its tip in the descending duodenum. Surgical small bowel anastomosis noted within the left abdomen, without evidence of obstruction or other surgical complicating feature. No dilated large or small bowel loops. Scattered diverticulosis noted throughout the colon without evidence of acute diverticulitis. Vascular/Lymphatic: Heavy atherosclerotic changes of the normal caliber abdominal aorta and pelvic vasculature. No enlarged lymph nodes seen. Reproductive: No mass or other significant abnormality. Other: No free fluid or abscess collection identified. No free intraperitoneal air. Musculoskeletal: Degenerative changes throughout the thoracolumbar spine. No acute or suspicious osseous finding. Superficial soft tissues are unremarkable. IMPRESSION: 1. Significant reduction in the gastric distention status post gastrostomy, with associated enteric tube passing through the stomach with  tip in the descending duodenum. 2. Remainder of the abdomen and pelvis is unremarkable. No small or large bowel obstruction. No free fluid or abscess. No free intraperitoneal air. 3. Colonic diverticulosis without evidence of acute diverticulitis. 4. Aortic atherosclerosis. 5. Small left pleural effusion and mild bibasilar atelectasis. Electronically Signed   By: Bary Richard M.D.   On: 11/06/2015 18:53   Ct Head Wo Contrast  Result Date: 11/06/2015 CLINICAL DATA:  Weakness, low heart rate. EXAM: CT HEAD WITHOUT CONTRAST TECHNIQUE: Contiguous axial images were obtained from the base of the skull through the vertex without intravenous contrast. COMPARISON:  Head CT dated 03/22/2014 and brain MRI dated 03/23/2014. FINDINGS: Brain: Again noted is generalized age related parenchymal atrophy with commensurate dilatation of the ventricles and sulci. Chronic small vessel ischemic changes again noted within the bilateral periventricular and subcortical white matter regions. There is no mass, hemorrhage, edema or other evidence of acute parenchymal abnormality. No extra-axial hemorrhage. Vascular: No hyperdense vessel or unexpected calcification. There are chronic calcified atherosclerotic changes of the large vessels at the skull base. Skull: Negative for fracture or focal lesion. Sinuses/Orbits: No acute findings. Other: None. IMPRESSION: 1. No acute intracranial abnormality. No intracranial mass, hemorrhage or edema. 2. Atrophy  and chronic ischemic changes in the white matter. Electronically Signed   By: Bary Richard M.D.   On: 11/06/2015 18:41   Dg Chest Port 1 View  Result Date: 11/08/2015 CLINICAL DATA:  New onset shortness of Breath EXAM: PORTABLE CHEST 1 VIEW COMPARISON:  10/27/2015 FINDINGS: Cardiac shadow is mildly enlarged but stable. Stable aortic calcifications are noted. Minimal scarring is again noted in the right lung base. Small pleural effusions are again seen bilaterally. IMPRESSION: Stable  appearance of the chest. Electronically Signed   By: Alcide Clever M.D.   On: 11/08/2015 09:52   Dg Abd Portable 1v  Result Date: 11/06/2015 CLINICAL DATA:  Generalized abdominal pain for 2 days. EXAM: PORTABLE ABDOMEN - 1 VIEW COMPARISON:  Abdomen x-ray dated 09/12/2015. FINDINGS: Single upright AP view centered at the level of the upper abdomen. Study limited by numerous overlying wires and pacer pads. Visualized bowel gas pattern is nonobstructive. No evidence of free intraperitoneal air seen. Probable small pleural effusions and/or atelectasis at each lung base. IMPRESSION: Limited exam due to numerous overlying, presumed extraneous, wires and pacer pads. Visualized bowel gas pattern is nonobstructive and there is no evidence of free intraperitoneal air seen. Probable small pleural effusions and/or atelectasis at each lung base. Electronically Signed   By: Bary Richard M.D.   On: 11/06/2015 16:52     Medical Consultants:    None.  Anti-Infectives:   none  Subjective:    Desmonique Helfman states she is feeling dizzy. No other complaints.   Objective:    Vitals:   11/08/15 0737 11/08/15 1026 11/08/15 1048 11/08/15 1243  BP: (!) 159/67 (!) 145/71 (!) 147/59 (!) 163/62  Pulse: 64  (!) 59   Resp:   18   Temp:   97.8 F (36.6 C)   TempSrc:   Oral   SpO2:      Weight:      Height:        Intake/Output Summary (Last 24 hours) at 11/08/15 1426 Last data filed at 11/08/15 1244  Gross per 24 hour  Intake              180 ml  Output              750 ml  Net             -570 ml   Filed Weights   11/06/15 2258  Weight: 65.5 kg (144 lb 4.8 oz)    Exam: General exam: In no acute distress.Cachectic appearing  Respiratory system: Good air movement and clear to auscultation. Cardiovascular system: S1 & S2 heard, RRR.  Gastrointestinal system: Abdomen is nondistended, soft and nontender.  PEG tube in place. Nontender.  Central nervous system: Alert and oriented. No focal neurological  deficits. Extremities: No pedal edema. Skin: No rashes, lesions or ulcers Psychiatry: Judgement and insight appear normal. Mood & affect appropriate.    Data Reviewed:    Labs: Basic Metabolic Panel:  Recent Labs Lab 11/06/15 1612 11/08/15 0739  NA 135 134*  K 5.2* 4.8  CL 103 104  CO2 23 19*  GLUCOSE 100* 122*  BUN 50* 36*  CREATININE 1.66* 1.39*  CALCIUM 9.8 9.5   GFR Estimated Creatinine Clearance: 25.2 mL/min (by C-G formula based on SCr of 1.39 mg/dL). Liver Function Tests:  Recent Labs Lab 11/06/15 1612  AST 26  ALT 23  ALKPHOS 49  BILITOT 0.5  PROT 7.6  ALBUMIN 3.6    Recent Labs Lab 11/06/15 1612  LIPASE 21   No results for input(s): AMMONIA in the last 168 hours. Coagulation profile No results for input(s): INR, PROTIME in the last 168 hours.  CBC:  Recent Labs Lab 11/06/15 1612  WBC 9.8  NEUTROABS 6.3  HGB 8.6*  HCT 26.2*  MCV 94.6  PLT 305   Cardiac Enzymes: No results for input(s): CKTOTAL, CKMB, CKMBINDEX, TROPONINI in the last 168 hours. BNP (last 3 results) No results for input(s): PROBNP in the last 8760 hours. CBG: No results for input(s): GLUCAP in the last 168 hours. D-Dimer: No results for input(s): DDIMER in the last 72 hours. Hgb A1c: No results for input(s): HGBA1C in the last 72 hours. Lipid Profile: No results for input(s): CHOL, HDL, LDLCALC, TRIG, CHOLHDL, LDLDIRECT in the last 72 hours. Thyroid function studies:  Recent Labs  11/08/15 1127  TSH 51.089*   Anemia work up: No results for input(s): VITAMINB12, FOLATE, FERRITIN, TIBC, IRON, RETICCTPCT in the last 72 hours. Sepsis Labs:  Recent Labs Lab 11/06/15 1612 11/06/15 1853  WBC 9.8  --   LATICACIDVEN 1.2 0.9   Microbiology No results found for this or any previous visit (from the past 240 hour(s)).   Medications:   . amiodarone  100 mg Oral Daily  . amLODipine  10 mg Oral Daily  . apixaban  2.5 mg Oral BID  . atorvastatin  10 mg Oral  q1800  . feeding supplement  1 Container Oral TID BM  . feeding supplement (OSMOLITE 1.5 CAL)  600 mL Per Tube Q2000  . feeding supplement (PRO-STAT SUGAR FREE 64)  30 mL Oral BID  . free water  60 mL Per Tube Q6H  . gabapentin  600 mg Oral BID   And  . gabapentin  300 mg Oral Q1200  . hydrALAZINE  50 mg Oral TID AC & HS  . isosorbide mononitrate  30 mg Oral Daily  . levothyroxine  100 mcg Oral QAC breakfast  . mometasone-formoterol  2 puff Inhalation BID  . nystatin   Topical Daily  . pantoprazole  40 mg Oral BID  . sertraline  100 mg Oral Daily  . sodium chloride  1 g Oral TID WC   Continuous Infusions:   Time spent: 25 min   LOS: 0 days   [facility]  [facility] Pager [phone]  *Please refer to [email or web address], password [email or web address] to get updated schedule on who will round on this patient, as hospitalists switch teams weekly. If 7PM-7AM, please contact night-coverage at [email or web address], password [email or web address] for any overnight needs.  11/08/2015, 2:26 PM

## 2015-11-08 NOTE — Progress Notes (Addendum)
82 year old female with a significant past medical history of paroxysmal atrial fibrillation/flutter on amiodarone, beta-blocker intolerance, non-obstructive coronary artery disease, type 2 diabetes, hypertension, cerebro-vascular disease, COPD, hypothyroidism, SIADH, and status post GJ tube is here for weakness.  Cardiology was consulted for symptomatic bradycardia.  Of note, patient was hospitalized in May of this year for bradycardia for which the patient recalls.  She was taken off her metoprolol she was taking at the time and her bradycardia resolved.  She did require dopamine initially during her hospitalization for heart rate control.  He amiodarone was eventually added back during her hospitalization without issues with bradycardia.    Cardiologist : Dr. Meda Coffee  Subjective: Became SOB and diaphoretic sitting up in chair, she states with these spells saliva pours out of her mouth.  Only two brief episodes of junct. Rhythm one to 43 last pm and one this AM to 53.    Objective: Vital signs in last 24 hours: Temp:  [97.6 F (36.4 C)-97.8 F (36.6 C)] 97.8 F (36.6 C) (08/07 0621) Pulse Rate:  [58-64] 64 (08/07 0737) Resp:  [18] 18 (08/07 0621) BP: (119-159)/(50-67) 159/67 (08/07 0737) SpO2:  [98 %-100 %] 100 % (08/07 0621) Weight change:    Intake/Output from previous day: 08/06 0701 - 08/07 0700 In: -  Out: 750 [Urine:750] Intake/Output this shift: Total I/O In: 60 [P.O.:60] Out: -   PE: General:Pleasant affect, NAD Skin:Warm and dry, brisk capillary refill HEENT:normocephalic, sclera clear, mucus membranes moist Neck:supple, + JVD, no bruits  Heart:S1S2 RRR without murmur, gallup, rub or click Lungs:diminished without rales, rhonchi, occ wheeze [id number], non tender, + BS, do not palpate liver spleen or masses Ext:no lower ext edema, 1+ pedal pulses, 2+ radial pulses Neuro:alert and oriented X 3, MAE, follows commands, + facial symmetry Tele junct. rhythm at times  but brief, mostly SR at 60 to slight SB  Lab Results:  Recent Labs  11/06/15 1612  WBC 9.8  HGB 8.6*  HCT 26.2*  PLT 305   BMET  Recent Labs  11/06/15 1612 11/08/15 0739  NA 135 134*  K 5.2* 4.8  CL 103 104  CO2 23 19*  GLUCOSE 100* 122*  BUN 50* 36*  CREATININE 1.66* 1.39*  CALCIUM 9.8 9.5   No results for input(s): TROPONINI in the last 72 hours.  Invalid input(s): CK, MB  Lab Results  Component Value Date   CHOL 126 04/07/2015   HDL 69 04/07/2015   LDLCALC 36 04/07/2015   TRIG 107 04/07/2015   CHOLHDL 1.8 04/07/2015   Lab Results  Component Value Date   HGBA1C 6.5 (H) 08/28/2015     Lab Results  Component Value Date   TSH 81.690 (H) 10/06/2015    Hepatic Function Panel  Recent Labs  11/06/15 1612  PROT 7.6  ALBUMIN 3.6  AST 26  ALT 23  ALKPHOS 49  BILITOT 0.5   No results for input(s): CHOL in the last 72 hours. No results for input(s): PROTIME in the last 72 hours.     Studies/Results: Ct Abdomen Pelvis Wo Contrast  Result Date: 11/06/2015 CLINICAL DATA:  Abdominal pain since PEG tube placement on Thursday. EXAM: CT ABDOMEN AND PELVIS WITHOUT CONTRAST TECHNIQUE: Multidetector CT imaging of the abdomen and pelvis was performed following the standard protocol without IV contrast. COMPARISON:  CT abdomen dated 09/11/2015. FINDINGS: Lower chest: Mild bibasilar atelectasis. Small left pleural effusion. Hepatobiliary: Status post cholecystectomy. Small hypodense lesions within the liver were better demonstrated  on the earlier contrast enhanced CT. Pancreas: No mass or inflammatory process identified on this un-enhanced exam. Spleen: Within normal limits in size. Adrenals/Urinary Tract: Kidneys are unremarkable without stone or hydronephrosis. No ureteral or bladder calculi identified. Stomach/Bowel: Interval resolution of the stomach distension status post percutaneous gastrostomy placement. Enteric tube placed via the gastrostomy site has its tip in  the descending duodenum. Surgical small bowel anastomosis noted within the left abdomen, without evidence of obstruction or other surgical complicating feature. No dilated large or small bowel loops. Scattered diverticulosis noted throughout the colon without evidence of acute diverticulitis. Vascular/Lymphatic: Heavy atherosclerotic changes of the normal caliber abdominal aorta and pelvic vasculature. No enlarged lymph nodes seen. Reproductive: No mass or other significant abnormality. Other: No free fluid or abscess collection identified. No free intraperitoneal air. Musculoskeletal: Degenerative changes throughout the thoracolumbar spine. No acute or suspicious osseous finding. Superficial soft tissues are unremarkable. IMPRESSION: 1. Significant reduction in the gastric distention status post gastrostomy, with associated enteric tube passing through the stomach with tip in the descending duodenum. 2. Remainder of the abdomen and pelvis is unremarkable. No small or large bowel obstruction. No free fluid or abscess. No free intraperitoneal air. 3. Colonic diverticulosis without evidence of acute diverticulitis. 4. Aortic atherosclerosis. 5. Small left pleural effusion and mild bibasilar atelectasis. Electronically Signed   By: Franki Cabot M.D.   On: 11/06/2015 18:53   Ct Head Wo Contrast  Result Date: 11/06/2015 CLINICAL DATA:  Weakness, low heart rate. EXAM: CT HEAD WITHOUT CONTRAST TECHNIQUE: Contiguous axial images were obtained from the base of the skull through the vertex without intravenous contrast. COMPARISON:  Head CT dated 03/22/2014 and brain MRI dated 03/23/2014. FINDINGS: Brain: Again noted is generalized age related parenchymal atrophy with commensurate dilatation of the ventricles and sulci. Chronic small vessel ischemic changes again noted within the bilateral periventricular and subcortical white matter regions. There is no mass, hemorrhage, edema or other evidence of acute parenchymal  abnormality. No extra-axial hemorrhage. Vascular: No hyperdense vessel or unexpected calcification. There are chronic calcified atherosclerotic changes of the large vessels at the skull base. Skull: Negative for fracture or focal lesion. Sinuses/Orbits: No acute findings. Other: None. IMPRESSION: 1. No acute intracranial abnormality. No intracranial mass, hemorrhage or edema. 2. Atrophy and chronic ischemic changes in the white matter. Electronically Signed   By: Franki Cabot M.D.   On: 11/06/2015 18:41   Dg Abd Portable 1v  Result Date: 11/06/2015 CLINICAL DATA:  Generalized abdominal pain for 2 days. EXAM: PORTABLE ABDOMEN - 1 VIEW COMPARISON:  Abdomen x-ray dated 09/12/2015. FINDINGS: Single upright AP view centered at the level of the upper abdomen. Study limited by numerous overlying wires and pacer pads. Visualized bowel gas pattern is nonobstructive. No evidence of free intraperitoneal air seen. Probable small pleural effusions and/or atelectasis at each lung base. IMPRESSION: Limited exam due to numerous overlying, presumed extraneous, wires and pacer pads. Visualized bowel gas pattern is nonobstructive and there is no evidence of free intraperitoneal air seen. Probable small pleural effusions and/or atelectasis at each lung base. Electronically Signed   By: Franki Cabot M.D.   On: 11/06/2015 16:52    Medications: I have reviewed the patient's current medications. Scheduled Meds: . amiodarone  100 mg Oral Daily  . amLODipine  10 mg Oral Daily  . apixaban  2.5 mg Oral BID  . atorvastatin  10 mg Oral q1800  . feeding supplement  1 Container Oral TID BM  . feeding supplement (OSMOLITE 1.5  CAL)  600 mL Per Tube Q2000  . feeding supplement (PRO-STAT SUGAR FREE 64)  30 mL Oral BID  . free water  60 mL Per Tube Q6H  . gabapentin  600 mg Oral BID   And  . gabapentin  300 mg Oral Q1200  . hydrALAZINE  50 mg Oral TID AC & HS  . isosorbide mononitrate  30 mg Oral Daily  . levothyroxine  100 mcg  Oral QAC breakfast  . mometasone-formoterol  2 puff Inhalation BID  . nystatin   Topical Daily  . pantoprazole  40 mg Oral BID  . sertraline  100 mg Oral Daily  . sodium chloride  1 g Oral TID WC   Continuous Infusions:  PRN Meds:.acetaminophen **OR** acetaminophen, albuterol, bisacodyl, polyethylene glycol, simethicone, traMADol  Assessment/Plan:  Brady-arrhythmia: Likely multifactorial (hypothyroidism and labetalol) use in the setting of a 89 year women.  Patient with symptoms that are likely related to her bradycardia.  She appears to be hemodynamically stable at this time.   - Would not recommend a pacemaker at this time was noted on admit.   - Check TSH and manage hypothyroidism accordingly.   - Continue to hold labetalol- stopped  # Hypertension: Patient with a known history of essential hypertension with blood pressure at goal based on age.  She does have renal failure in the setting of hypertension and diabetes.   - No changes in home blood pressure medications except for stopping labetalol.  # Paroxysmal atrial fibrillation Patient with a known history of paroxysmal atrial fibrillation/flutter.  ECG does not show atrial fibrillation at this time.  She is currently on amiodarone for rhythm control and taking Apixaban for stroke secondary prevention.   - Continue apixaban and Amiodarone for now.    Pt is partial DNR   HYPOTHYROIDISM:  TSH was 83 in July is on levothyroxine 100 mcg increased from 88 mcg when TSH level noted. Will order TSH  SOB this AM with diminished BS? Aspiration will check CXR   LOS: 0 days   Time spent with pt. :15 minutes. Cecilie Kicks  Nurse Practitioner Certified Pager [phone] or after 5pm and on weekends call [phone] 11/08/2015, 9:21 AM  The patient was seen, examined and discussed with Cecilie Kicks, NP and I agree with the above.   Patient's bradycardia is improving, now lowest down to 50s with the longest R to R interval 1.9 second. She  continues to have a few episodes of junctional bradycardia. I would wait at least 1 more day as labetalol might take up to 3 days to wash out. I will talk to EP service if there is any possible further decrease of amiodarone 200 mg every other day. She has known hypothyroidism her levothyroxine was increased in July, however her TSH today remains significantly elevated at 51 and I would further increase her levothyroxine to 112 g. We will follow.  Ena Dawley 11/08/2015  I previously recommended to increase levothyroxine to 112 g, however seen in order written for 150 g, this is almost doubled the previous dose and I feel it's fairly aggressive in an elderly 82 year old female I would decrease the dose to 125 g for a few days and then decreased back to 112 g.  Ena Dawley 11/08/2015

## 2015-11-08 NOTE — Progress Notes (Addendum)
Initial Nutrition Assessment  DOCUMENTATION CODES:   Severe malnutrition in context of chronic illness  INTERVENTION:    Continue Osmolite 1.5 formula at 50 ml/hr via PEG tube x 12 hours (infuse from 8 PM to 8 AM)   Continue Boost Breeze po daily, each supplement provides 250 kcal and 9 grams of protein   Continue Prostat liquid protein po 30 ml BID, each supplement provides 100 kcal, 15 grams protein  NUTRITION DIAGNOSIS:   Inadequate oral intake related to altered GI function as evidenced by  (supplemental TF via PEG tube)  GOAL:   Patient will meet greater than or equal to 90% of their needs  MONITOR:   TF tolerance, PO intake, Labs, Weight trends, I & O's  REASON FOR ASSESSMENT:   Malnutrition Screening Tool, Other (Comment) (New Tube Feeding)  ASSESSMENT:   82 y.o. female with PMH significant for Afib on apixaban and amiodarone, remote CVA, HTN, SIADH, hypothyroidism, anemia, CKD, and gastric outlet obstruction with recent PEG tube who presented with weak spells and bradycardia, increasing in frequency.  RD unable to obtain much nutrition hx from patient. Chart reviewed. Pt takes PO diet and receives nocturnal TF via PEG tube (Osmolite 1.5 formula at 50 ml/hr from 8 PM to 8 AM). TF regimen provides 900 kcals, 38 gm protein, 457 ml of free water. Pt also takes Boost Breeze daily & Prostat liquid protein 30 ml po BID. S/p bedside swallow evaluation 8/6 >> SLP recommending full liquids. Nutrition-Focused physical exam completed. Findings are severe fat depletion, severe muscle depletion, and no edema.   Diet Order:  Diet full liquid Room service appropriate? Yes; Fluid consistency: Thin  Skin:  Reviewed, no issues  Last BM:  8/7  Height:   Ht Readings from Last 1 Encounters:  11/06/15 5\' 6"  (1.676 m)    Weight:   Wt Readings from Last 1 Encounters:  11/06/15 144 lb 4.8 oz (65.5 kg)    Wt Readings from Last 10 Encounters:  11/06/15 144 lb 4.8 oz (65.5  kg)  10/27/15 154 lb 12.8 oz (70.2 kg)  10/06/15 146 lb (66.2 kg)  09/30/15 152 lb 1.9 oz (69 kg)  09/07/15 151 lb (68.5 kg)  08/26/15 150 lb 11.2 oz (68.4 kg)  08/10/15 148 lb 12.8 oz (67.5 kg)  08/06/15 147 lb 9.6 oz (67 kg)  08/05/15 149 lb 1.6 oz (67.6 kg)  08/02/15 153 lb 12.8 oz (69.8 kg)    Ideal Body Weight:  59 kg  BMI:  Body mass index is 23.29 kg/m.  Estimated Nutritional Needs:   Kcal:  1400-1600  Protein:  65-75 gm  Fluid:  >/= 1.5 L  EDUCATION NEEDS:   No education needs identified at this time  Arthur Holms, RD, LDN Pager #: [phone] After-Hours Pager #: [phone]

## 2015-11-09 ENCOUNTER — Observation Stay (HOSPITAL_COMMUNITY): Payer: Medicare Other

## 2015-11-09 DIAGNOSIS — R001 Bradycardia, unspecified: Secondary | ICD-10-CM | POA: Diagnosis not present

## 2015-11-09 DIAGNOSIS — M1990 Unspecified osteoarthritis, unspecified site: Secondary | ICD-10-CM | POA: Diagnosis present

## 2015-11-09 DIAGNOSIS — E039 Hypothyroidism, unspecified: Secondary | ICD-10-CM

## 2015-11-09 DIAGNOSIS — E222 Syndrome of inappropriate secretion of antidiuretic hormone: Secondary | ICD-10-CM | POA: Diagnosis present

## 2015-11-09 DIAGNOSIS — K3184 Gastroparesis: Secondary | ICD-10-CM | POA: Diagnosis present

## 2015-11-09 DIAGNOSIS — J4521 Mild intermittent asthma with (acute) exacerbation: Secondary | ICD-10-CM

## 2015-11-09 DIAGNOSIS — R0602 Shortness of breath: Secondary | ICD-10-CM | POA: Diagnosis not present

## 2015-11-09 DIAGNOSIS — K219 Gastro-esophageal reflux disease without esophagitis: Secondary | ICD-10-CM | POA: Diagnosis present

## 2015-11-09 DIAGNOSIS — E43 Unspecified severe protein-calorie malnutrition: Secondary | ICD-10-CM | POA: Diagnosis present

## 2015-11-09 DIAGNOSIS — I482 Chronic atrial fibrillation: Secondary | ICD-10-CM | POA: Diagnosis present

## 2015-11-09 DIAGNOSIS — E1142 Type 2 diabetes mellitus with diabetic polyneuropathy: Secondary | ICD-10-CM | POA: Diagnosis present

## 2015-11-09 DIAGNOSIS — J45901 Unspecified asthma with (acute) exacerbation: Secondary | ICD-10-CM | POA: Diagnosis present

## 2015-11-09 DIAGNOSIS — E1143 Type 2 diabetes mellitus with diabetic autonomic (poly)neuropathy: Secondary | ICD-10-CM | POA: Diagnosis present

## 2015-11-09 DIAGNOSIS — I129 Hypertensive chronic kidney disease with stage 1 through stage 4 chronic kidney disease, or unspecified chronic kidney disease: Secondary | ICD-10-CM | POA: Diagnosis present

## 2015-11-09 DIAGNOSIS — D638 Anemia in other chronic diseases classified elsewhere: Secondary | ICD-10-CM | POA: Diagnosis present

## 2015-11-09 DIAGNOSIS — I480 Paroxysmal atrial fibrillation: Secondary | ICD-10-CM | POA: Diagnosis not present

## 2015-11-09 DIAGNOSIS — I2510 Atherosclerotic heart disease of native coronary artery without angina pectoris: Secondary | ICD-10-CM | POA: Diagnosis present

## 2015-11-09 DIAGNOSIS — E1122 Type 2 diabetes mellitus with diabetic chronic kidney disease: Secondary | ICD-10-CM | POA: Diagnosis present

## 2015-11-09 DIAGNOSIS — Z66 Do not resuscitate: Secondary | ICD-10-CM | POA: Diagnosis present

## 2015-11-09 DIAGNOSIS — I4892 Unspecified atrial flutter: Secondary | ICD-10-CM | POA: Diagnosis present

## 2015-11-09 DIAGNOSIS — I10 Essential (primary) hypertension: Secondary | ICD-10-CM | POA: Diagnosis not present

## 2015-11-09 DIAGNOSIS — N183 Chronic kidney disease, stage 3 (moderate): Secondary | ICD-10-CM | POA: Diagnosis not present

## 2015-11-09 DIAGNOSIS — J449 Chronic obstructive pulmonary disease, unspecified: Secondary | ICD-10-CM | POA: Diagnosis present

## 2015-11-09 DIAGNOSIS — K311 Adult hypertrophic pyloric stenosis: Secondary | ICD-10-CM | POA: Diagnosis present

## 2015-11-09 DIAGNOSIS — R42 Dizziness and giddiness: Secondary | ICD-10-CM | POA: Diagnosis not present

## 2015-11-09 DIAGNOSIS — M329 Systemic lupus erythematosus, unspecified: Secondary | ICD-10-CM | POA: Diagnosis present

## 2015-11-09 DIAGNOSIS — E032 Hypothyroidism due to medicaments and other exogenous substances: Secondary | ICD-10-CM | POA: Diagnosis not present

## 2015-11-09 DIAGNOSIS — E785 Hyperlipidemia, unspecified: Secondary | ICD-10-CM | POA: Diagnosis present

## 2015-11-09 DIAGNOSIS — I455 Other specified heart block: Secondary | ICD-10-CM | POA: Diagnosis not present

## 2015-11-09 LAB — CBC
HCT: 27.7 % — ABNORMAL LOW (ref 36.0–46.0)
Hemoglobin: 9.2 g/dL — ABNORMAL LOW (ref 12.0–15.0)
MCH: 31.2 pg (ref 26.0–34.0)
MCHC: 33.2 g/dL (ref 30.0–36.0)
MCV: 93.9 fL (ref 78.0–100.0)
Platelets: 332 10*3/uL (ref 150–400)
RBC: 2.95 MIL/uL — ABNORMAL LOW (ref 3.87–5.11)
RDW: 16.1 % — ABNORMAL HIGH (ref 11.5–15.5)
WBC: 6.3 10*3/uL (ref 4.0–10.5)

## 2015-11-09 LAB — BASIC METABOLIC PANEL
Anion gap: 10 (ref 5–15)
BUN: 35 mg/dL — ABNORMAL HIGH (ref 6–20)
CO2: 20 mmol/L — ABNORMAL LOW (ref 22–32)
Calcium: 9.8 mg/dL (ref 8.9–10.3)
Chloride: 103 mmol/L (ref 101–111)
Creatinine, Ser: 1.29 mg/dL — ABNORMAL HIGH (ref 0.44–1.00)
GFR calc Af Amer: 41 mL/min — ABNORMAL LOW (ref >60)
GFR calc non Af Amer: 35 mL/min — ABNORMAL LOW (ref >60)
Glucose, Bld: 135 mg/dL — ABNORMAL HIGH (ref 65–99)
Potassium: 4.8 mmol/L (ref 3.5–5.1)
Sodium: 133 mmol/L — ABNORMAL LOW (ref 135–145)

## 2015-11-09 LAB — T3, FREE: T3, Free: 1.3 pg/mL — ABNORMAL LOW (ref 2.0–4.4)

## 2015-11-09 LAB — MAGNESIUM: Magnesium: 2 mg/dL (ref 1.7–2.4)

## 2015-11-09 MED ORDER — IPRATROPIUM-ALBUTEROL 0.5-2.5 (3) MG/3ML IN SOLN
3.0000 mL | RESPIRATORY_TRACT | Status: DC
  Administered 2015-11-09 (×2): 3 mL via RESPIRATORY_TRACT
  Filled 2015-11-09 (×2): qty 3

## 2015-11-09 MED ORDER — IPRATROPIUM-ALBUTEROL 0.5-2.5 (3) MG/3ML IN SOLN: 3.0000 mL | Freq: Four times a day (QID) | RESPIRATORY_TRACT | Status: DC

## 2015-11-09 MED ORDER — LATANOPROST 0.005 % OP SOLN
1.0000 [drp] | Freq: Every day | OPHTHALMIC | Status: DC
Start: 2015-11-09 — End: 2015-11-11
  Administered 2015-11-09 – 2015-11-10 (×2): 1 [drp] via OPHTHALMIC
  Filled 2015-11-09: qty 2.5

## 2015-11-09 MED ORDER — METOCLOPRAMIDE HCL 5 MG/ML IJ SOLN
5.0000 mg | Freq: Once | INTRAMUSCULAR | Status: AC
  Administered 2015-11-09: 5 mg via INTRAVENOUS
  Filled 2015-11-09: qty 2

## 2015-11-09 MED ORDER — POLYETHYL GLYCOL-PROPYL GLYCOL 0.4-0.3 % OP SOLN: 1.0000 [drp] | Freq: Four times a day (QID) | OPHTHALMIC | Status: DC

## 2015-11-09 MED ORDER — RESOURCE THICKENUP CLEAR PO POWD
ORAL | Status: DC | PRN
  Filled 2015-11-09: qty 125

## 2015-11-09 MED ORDER — IPRATROPIUM-ALBUTEROL 0.5-2.5 (3) MG/3ML IN SOLN: 3.0000 mL | RESPIRATORY_TRACT | Status: DC | PRN

## 2015-11-09 MED ORDER — CYCLOSPORINE 0.05 % OP EMUL
1.0000 [drp] | Freq: Two times a day (BID) | OPHTHALMIC | Status: DC
  Administered 2015-11-09 – 2015-11-11 (×5): 1 [drp] via OPHTHALMIC
  Filled 2015-11-09 (×6): qty 1

## 2015-11-09 MED ORDER — POLYVINYL ALCOHOL 1.4 % OP SOLN
1.0000 [drp] | Freq: Four times a day (QID) | OPHTHALMIC | Status: DC
  Administered 2015-11-09 – 2015-11-11 (×8): 1 [drp] via OPHTHALMIC
  Filled 2015-11-09: qty 15

## 2015-11-09 NOTE — Progress Notes (Signed)
Patient Profile: 82 year old female with a significant past medical history of paroxysmal atrial fibrillation/flutter on amiodarone, beta-blocker intolerance, non-obstructive coronary artery disease, type 2 diabetes, hypertension, cerebro-vascular disease, COPD, hypothyroidism, SIADH, and status post GJ tube is here for weakness. Cardiology was consulted for symptomatic bradycardia.  Subjective: She is labored breathing today especially on expiration and wheezing. No chest pain no more dizziness or diaphoresis but she stayed in bed since yesterday. She would like to walk.   Objective: Vital signs in last 24 hours: Temp:  [97.6 F (36.4 C)-97.8 F (36.6 C)] 97.7 F (36.5 C) (08/08 0423) Pulse Rate:  [59-62] 62 (08/08 0818) Resp:  [18] 18 (08/08 0818) BP: (139-163)/(49-71) 155/59 (08/08 0818) SpO2:  [100 %] 100 % (08/08 0818) Last BM Date: 11/09/15  Intake/Output from previous day: 08/07 0701 - 08/08 0700 In: 2635 [P.O.:380; NG/GT:2255] Out: 1025 [Urine:1025] Intake/Output this shift: No intake/output data recorded.  Medications Current Facility-Administered Medications  Medication Dose Route Frequency Provider Last Rate Last Dose  . acetaminophen (TYLENOL) tablet 650 mg  650 mg Oral Q6H PRN Edwin Dada, MD   650 mg at 11/07/15 [id number]   Or  . acetaminophen (TYLENOL) suppository 650 mg  650 mg Rectal Q6H PRN Edwin Dada, MD      . albuterol (PROVENTIL) (2.5 MG/3ML) 0.083% nebulizer solution 2.5 mg  2.5 mg Inhalation Q6H PRN Edwin Dada, MD      . amiodarone (PACERONE) tablet 100 mg  100 mg Oral Daily Edwin Dada, MD   100 mg at 11/08/15 1029  . amLODipine (NORVASC) tablet 10 mg  10 mg Oral Daily Edwin Dada, MD   10 mg at 11/08/15 1027  . apixaban (ELIQUIS) tablet 2.5 mg  2.5 mg Oral BID Edwin Dada, MD   2.5 mg at 11/08/15 2249  . atorvastatin (LIPITOR) tablet 10 mg  10 mg Oral q1800 Edwin Dada, MD   10 mg at  11/08/15 1721  . bisacodyl (DULCOLAX) suppository 10 mg  10 mg Rectal Daily PRN Edwin Dada, MD      . feeding supplement (BOOST / RESOURCE BREEZE) liquid 1 Container  1 Container Oral Daily Saima Rizwan, MD      . feeding supplement (OSMOLITE 1.5 CAL) liquid 1,000 mL  1,000 mL Per Tube Q2000 Debbe Odea, MD 50 mL/hr at 11/08/15 2249 1,000 mL at 11/08/15 2249  . feeding supplement (PRO-STAT SUGAR FREE 64) liquid 30 mL  30 mL Oral BID Edwin Dada, MD   30 mL at 11/08/15 2249  . free water 60 mL  60 mL Per Tube Q6H Edwin Dada, MD   60 mL at 11/09/15 0610  . gabapentin (NEURONTIN) tablet 600 mg  600 mg Oral BID Edwin Dada, MD   600 mg at 11/08/15 2249   And  . gabapentin (NEURONTIN) capsule 300 mg  300 mg Oral Q1200 Edwin Dada, MD   300 mg at 11/08/15 1246  . hydrALAZINE (APRESOLINE) tablet 50 mg  50 mg Oral TID AC & HS Edwin Dada, MD   50 mg at 11/09/15 0820  . isosorbide mononitrate (IMDUR) 24 hr tablet 30 mg  30 mg Oral Daily Edwin Dada, MD   30 mg at 11/08/15 1030  . levothyroxine (SYNTHROID, LEVOTHROID) tablet 125 mcg  125 mcg Oral QAC breakfast Dorothy Spark, MD   125 mcg at 11/09/15 [phone]  . mometasone-formoterol (DULERA) 200-5 MCG/ACT inhaler 2 puff  2 puff Inhalation BID Edwin Dada, MD   2 puff at 11/09/15 0755  . nystatin (MYCOSTATIN/NYSTOP) topical powder   Topical Daily Edwin Dada, MD      . pantoprazole (PROTONIX) EC tablet 40 mg  40 mg Oral BID Edwin Dada, MD   40 mg at 11/08/15 2249  . polyethylene glycol (MIRALAX / GLYCOLAX) packet 17 g  17 g Oral BID PRN Edwin Dada, MD      . sertraline (ZOLOFT) tablet 100 mg  100 mg Oral Daily Edwin Dada, MD   100 mg at 11/08/15 1028  . simethicone (MYLICON) chewable tablet 80 mg  80 mg Oral QID PRN Edwin Dada, MD      . sodium chloride tablet 1 g  1 g Oral TID WC Edwin Dada, MD   1 g at 11/09/15  [id number]  . traMADol (ULTRAM) tablet 50 mg  50 mg Oral Daily PRN Edwin Dada, MD   50 mg at 11/07/15 0043    PE: General appearance: alert, cooperative and elderly and frail Neck: no carotid bruit and no JVD Lungs: faint wheezes bilaterally Heart: regular rate and rhythm Extremities: no LEE Pulses: 2+ and symmetric Skin: warm and dry Neurologic: Grossly normal  Lab Results:   Recent Labs  11/06/15 1612 11/09/15 0423  WBC 9.8 6.3  HGB 8.6* 9.2*  HCT 26.2* 27.7*  PLT 305 332   BMET  Recent Labs  11/06/15 1612 11/08/15 0739  NA 135 134*  K 5.2* 4.8  CL 103 104  CO2 23 19*  GLUCOSE 100* 122*  BUN 50* 36*  CREATININE 1.66* 1.39*  CALCIUM 9.8 9.5     Assessment/Plan  Principal Problem:   Bradycardia Active Problems:   Essential hypertension, benign   A-fib (HCC)   Hypothyroidism   Anemia of chronic disease   CKD (chronic kidney disease) stage 3, GFR 30-59 ml/min   Sinus arrest   Weakness   Hypothyroidism due to medication  1. Brady-arrhythmia: Likely multifactorial (hypothyroidism and labetalol) use in the setting of a 26 year women. Patient with symptoms that are likely related to her bradycardia. She appears to be hemodynamically stable at this time.  - Would not recommend a pacemaker at this time was noted on admit.  - Manage hypothyroidism accordingly (on levothyroxine)  - labetalol discontinued - HR currently in the 50s, with occasional junctional bradycardia  - ? Reducing amiodarone to every other day, currently on 100 mg daily.   2. Hypertension: Patient with a known history of essential hypertension with blood pressure at goal based on age. She does have renal failure in the setting of hypertension and diabetes.  - No changes in home blood pressure medications except for stopping labetalol, due to bradycardia.  3. Paroxysmal atrial fibrillation Patient with a known history of paroxysmal atrial fibrillation/flutter. ECG does not  show atrial fibrillation at this time. She is currently on amiodarone for rhythm control and taking Apixaban for secondary prevention of stroke. - Continue apixaban and Amiodarone for now.    4. HYPOTHYROIDISM:  On levothyroxine. TSH is 51 however Free T3, T4 WNL. Levothyroxine recently increased. Now on 125 mcg daily. Plan is to reduce dose down to 112 mcg given age (was previously on 88 mcg daily before increase).   5. CODE Status:  Pt is partial DNR     LOS: 0 days   Brittainy M. Ladoris Gene 11/09/2015 9:47 AM   The patient was seen, examined and  discussed with Brittainy M. Rosita Fire, PA-C and I agree with the above.   Patient's bradycardia is improving, her heart rate is now in upper 50s with occasional junctional bradycardia and maximal artery interval 1.9 second, she hasn't had any more symptoms of dizziness or diaphoresis however hasn't been out of bed. She has more labored breathing today and is wheezing on physical exam we will increase the nebulizer from every 6 to every 4 hours. We will start physical therapy to help her start walking and to evaluate if she is ready for discharge in the next 24-48 hours. TSH today remains significantly elevated at 51 and levothyroxine was elevated to 125 g yesterday.  Ena Dawley 11/09/2015

## 2015-11-09 NOTE — Progress Notes (Addendum)
Speech Language Pathology Treatment: Dysphagia  Patient Details Name: Shannon [name] MRN: [id number] DOB: November 18, 1923 Today's Date: 11/09/2015 Time: 1020-1030 SLP Time Calculation (min) (ACUTE ONLY): 10 min  Assessment / Plan / Recommendation Clinical Impression  Dysphagia treatment provided to check diet tolerance. Upon entering room, pt expressed feeling more SOB today, coughing up white phlegm/ sputum. Observed pt take meds with RN. No overt s/s of aspiration noted; pt's SOB persisted. Daughter was at bedside during treatment and expressed that pt does cough/ get "choked up" at times during meals. Voice quality continues to be hoarse with intermittent breaks/ some audible stridor; daughter believes this is a new symptom on this admission. CXR 8/7 was stable but with minimal scarring at RLL and small pleural effusions bilaterally. Recommend proceeding with MBS to objectively evaluate swallow function/ determine safest diet/ strategies. Plan for MBS this afternoon.   HPI HPI: Pt is a 82 y.o. female with PMH of Afib on apixaban and amiodarone, remote CVA, HTN, SIADH, hypothyroidism, anemia, CKD, and gastric outlet obstruction with recent PEG tube who presented to ED on 8/5 with weak spells and bradycardia, increasing in frequency, along with transient R side weakness. Reportedly has G-tube but takes meds and liquids by mouth. Pt has been followed by SLP on previous admissions- most recent recommendation in 2016 was full liquid (nectar) allowing tsp of thin liquid sips as well. Head CT clear, abdominal CT showed significant reduction in the gastric distention status postgastrostomy, with associated enteric tube passing through the stomach with tip in the descending duodenum; small L pleural effusion and mild bibasilar atelectasis. Bedside swallow eval ordered.      SLP Plan  MBS     Recommendations  Diet recommendations: Thin liquid (full liquid) Liquids provided via: Cup;Straw Medication  Administration: Crushed with puree Supervision: Patient able to self feed;Intermittent supervision to cue for compensatory strategies Compensations: Slow rate;Small sips/bites Postural Changes and/or Swallow Maneuvers: Seated upright 90 degrees;Upright 30-60 min after meal             Oral Care Recommendations: Oral care BID Follow up Recommendations: 24 hour supervision/assistance Plan: MBS     GO           Functional Assessment Tool Used: clinical judgment Functional Limitations: Swallowing Swallow Current Status [id number]): At least 20 percent but less than 40 percent impaired, limited or restricted Swallow Goal Status [phone]): At least 1 percent but less than 20 percent impaired, limited or restricted    Kern Reap, [place], CCC-SLP 11/09/2015, 11:12 AM [phone]

## 2015-11-09 NOTE — Progress Notes (Signed)
MBSS complete. Full report located under chart review in imaging section.  Recommend: continue full liquid- thicken liquids to nectar, meds crushed in puree, intermittent supervision- small bites/ sips, swallow 2 times.  Blair Heys, [place], [place]

## 2015-11-09 NOTE — Evaluation (Signed)
Physical Therapy Evaluation Patient Details Name: Shannon [name] MRN: [id number] DOB: 05/08/1923 Today's Date: 11/09/2015   History of Present Illness  Pt adm with bradycardia and weakness. PMH - Gtube due to gastic obstruction, HTN, afib, MI, CVA, DM, Copd, PVD  Clinical Impression  Pt admitted with above diagnosis and presents to PT with functional limitations due to deficits listed below (See PT problem list). Pt needs skilled PT to maximize independence and safety to allow discharge to home with family. Pt with good home support and motivated to stay active and mobile. Will follow acutely but doubt will need any PT at time of DC. Should be ready to return home from PT standpoint when medically ready.     Follow Up Recommendations No PT follow up    Equipment Recommendations  None recommended by PT    Recommendations for Other Services       Precautions / Restrictions Precautions Precautions: Fall;None Restrictions Weight Bearing Restrictions: No      Mobility  Bed Mobility Overal bed mobility: Needs Assistance Bed Mobility: Supine to Sit;Sit to Supine     Supine to sit: Supervision;HOB elevated Sit to supine: Min assist   General bed mobility comments: Assist to bring feet back up into bed  Transfers Overall transfer level: Needs assistance Equipment used: 4-wheeled walker Transfers: Sit to/from Stand Sit to Stand: Supervision         General transfer comment: for safety  Ambulation/Gait Ambulation/Gait assistance: Supervision Ambulation Distance (Feet): 250 Feet Assistive device: 4-wheeled walker Gait Pattern/deviations: Step-through pattern;Decreased stride length   Gait velocity interpretation: at or above normal speed for age/gender General Gait Details: Pt with able to manuever rollator well. Dyspnea 2/4 with amb SpO2 100% on RA  Stairs            Wheelchair Mobility    Modified Rankin (Stroke Patients Only)       Balance Overall balance  assessment: Needs assistance Sitting-balance support: No upper extremity supported;Feet supported Sitting balance-Leahy Scale: Normal     Standing balance support: No upper extremity supported Standing balance-Leahy Scale: Fair                               Pertinent Vitals/Pain Pain Assessment: Faces Faces Pain Scale: Hurts little more Pain Location: bilateral heels Pain Descriptors / Indicators: Grimacing Pain Intervention(s): Repositioned (Placed pillow under calves to keep heels off of bed)    Home Living Family/patient expects to be discharged to:: Private residence Living Arrangements: Children Available Help at Discharge: Family;Available 24 hours/day Type of Home: House Home Access: Ramped entrance     Home Layout: One level Home Equipment: Walker - 4 wheels;Cane - single point;Shower seat;Hand held Careers information officer - 2 wheels Additional Comments: Lives w/ daughter, son in Social worker, and grandson    Prior Function Level of Independence: Independent with assistive device(s)         Comments: Uses rollator during the day and RW at night.       Hand Dominance   Dominant Hand: Right    Extremity/Trunk Assessment   Upper Extremity Assessment: Overall WFL for tasks assessed           Lower Extremity Assessment: Generalized weakness         Communication   Communication: No difficulties  Cognition Arousal/Alertness: Awake/alert Behavior During Therapy: WFL for tasks assessed/performed Overall Cognitive Status: Within Functional Limits for tasks assessed  General Comments      Exercises        Assessment/Plan    PT Assessment Patient needs continued PT services  PT Diagnosis Generalized weakness   PT Problem List Decreased strength;Decreased activity tolerance;Decreased balance;Decreased mobility  PT Treatment Interventions DME instruction;Gait training;Functional mobility training;Therapeutic  activities;Therapeutic exercise;Balance training;Patient/family education   PT Goals (Current goals can be found in the Care Plan section) Acute Rehab PT Goals Patient Stated Goal: return home PT Goal Formulation: With patient Time For Goal Achievement: 11/16/15 Potential to Achieve Goals: Good    Frequency Min 3X/week   Barriers to discharge        Co-evaluation               End of Session Equipment Utilized During Treatment: Gait belt Activity Tolerance: Patient tolerated treatment well Patient left: in bed;with call bell/phone within reach Nurse Communication: Mobility status;Other (comment) (pressure areas on heels)    Functional Assessment Tool Used: clinical judgement Functional Limitation: Mobility: Walking and moving around Mobility: Walking and Moving Around Current Status [phone]): At least 1 percent but less than 20 percent impaired, limited or restricted Mobility: Walking and Moving Around Goal Status [phone]): 0 percent impaired, limited or restricted    Time: 5361-4431 PT Time Calculation (min) (ACUTE ONLY): 19 min   Charges:   PT Evaluation $PT Eval Moderate Complexity: 1 Procedure     PT G Codes:   PT G-Codes **NOT FOR INPATIENT CLASS** Functional Assessment Tool Used: clinical judgement Functional Limitation: Mobility: Walking and moving around Mobility: Walking and Moving Around Current Status ([id number]): At least 1 percent but less than 20 percent impaired, limited or restricted Mobility: Walking and Moving Around Goal Status [phone]): 0 percent impaired, limited or restricted    [facility] 11/09/2015, 3:43 PM [facility] PT [phone]

## 2015-11-09 NOTE — Progress Notes (Signed)
PROGRESS NOTE    Shannon [name]  ZOX:[id number] DOB: [birth year] DOA: 11/06/2015 PCP: Kirt Boys, DO   Brief Narrative:  Shannon [name] is an 82 y.o. female past medical history of A. fib and apixiban, amiodarone, essential hypertension, normocytic anemia chronic and kidney disease with a recent PEG tube due to gastric outlet obstruction comes in for several spells of weakness and sweating in the last 2 or 3 days and was found to be severely bradycardic in the ED.,    Assessment & Plan:   Principal Problem:   Bradycardia Active Problems:   Essential hypertension, benign   A-fib (HCC)   Hypothyroidism   Anemia of chronic disease   CKD (chronic kidney disease) stage 3, GFR 30-59 ml/min   Sinus arrest   Weakness   Hypothyroidism due to medication  #1 symptomatic bradycardia Likely medication induced. Patient's labetalol on hold. Patient's TSH elevated at 51.089 with a free T4 of 0.73 and a T3 of 1.3. Patient's Synthroid was initially increased to daily which has subsequently been reduced to 125 MCG's daily per cardiology for the next 2 days and then decrease to 112 MCG's daily. Patient currently stable. Cardiology not recommending a pacemaker at this time. Amiodarone dose being decreased per cardiology. Cardiology following and appreciate input and recommendations.  #2 hypothyroidism TSH noted to be a 51.089 improved from 81.690 on 10/06/2015. Patient's Synthroid dose was initially increased to 150 MCG's daily however hasn't reduced to 125 MCG's daily per cardiology and recommended decreasing further 212 MCG's daily after a few days. Patient will likely need repeat thyroid function studies done in about 4-6 weeks as outpatient.  #3 hypertension Stable. Continue current regimen of Norvasc, hydralazine,imdur.. Follow.  #4 chronic atrial fibrillation Patient with history of atrial fibrillation. Continue amiodarone for rhythm control. Currently rate controlled. Apixaban for  anticoagulation.  #5 chronic kidney disease stage III Stable.  #6 Anemia of chronic disease H&H stable.  #7 gastric outlet obstruction status post PEG placement Patient with complaints of increasing saliva. Tube feeds currently at goal at 60 mL per hour. Patient getting tube feeds at bedtime only. Continue current diet of full liquids. Given dose of Reglan IV 1. May consider decreasing the rate of tube feedings.  #8 shortness of breath Patient with some complaints of shortness of breath. Lung exam unremarkable. Patient speaking in full sentences. Will repeat a chest x-ray. Continue dulera. Nebulizer treatments as needed.    DVT prophylaxis: eliquis Code Status: Partial Family Communication: Updated patient and daughter at bedside. Disposition Plan: Home when medically stable with improvement in bradycardia.   Consultants:   Cardiology: Dr. Orson Aloe 11/06/2015  Procedures:   CT abdomen and pelvis 11/06/2015  CT head 11/06/2015  Chest x-ray 11/08/2015  Abdominal films 11/06/2015  Antimicrobials:   None   Subjective: Patient was some complaints of shortness of breath as well as spitting up some's clear sputum. Patient states less diaphoretic. Patient denies any chest pain. Daughter asking if patient can ambulate.  Objective: Vitals:   11/08/15 2045 11/09/15 0423 11/09/15 0818 11/09/15 1020  BP: (!) 139/49 (!) 148/62 (!) 155/59 (!) 156/63  Pulse:   62 62  Resp: 18 18 18    Temp: 97.6 F (36.4 C) 97.7 F (36.5 C)  97.7 F (36.5 C)  TempSrc: Oral Oral  Oral  SpO2: 100%  100%   Weight:      Height:        Intake/Output Summary (Last 24 hours) at 11/09/15 1050 Last data filed at 11/09/15  0700  Gross per 24 hour  Intake             2575 ml  Output             1025 ml  Net             1550 ml   Filed Weights   11/06/15 2258  Weight: 65.5 kg (144 lb 4.8 oz)    Examination:  General exam: Patient appears anxious. Respiratory system: Clear to  auscultation. Some use of accessory muscles of respiration, but speaking in full sentences. Cardiovascular system: Regular rate and rhythm .No JVD, murmurs, rubs, gallops or clicks. No pedal edema. Gastrointestinal system: Abdomen is nondistended, soft and nontender. No organomegaly or masses felt. Normal bowel sounds heard. PEG tube site intact. Central nervous system: Alert and oriented. No focal neurological deficits. Extremities: Symmetric 5 x 5 power. Skin: No rashes, lesions or ulcers Psychiatry: Judgement and insight appear normal. Mood & affect appropriate.     Data Reviewed: I have personally reviewed following labs and imaging studies  CBC:  Recent Labs Lab 11/06/15 1612 11/09/15 0423  WBC 9.8 6.3  NEUTROABS 6.3  --   HGB 8.6* 9.2*  HCT 26.2* 27.7*  MCV 94.6 93.9  PLT 305 332   Basic Metabolic Panel:  Recent Labs Lab 11/06/15 1612 11/08/15 0739 11/09/15 0854  NA 135 134* 133*  K 5.2* 4.8 4.8  CL 103 104 103  CO2 23 19* 20*  GLUCOSE 100* 122* 135*  BUN 50* 36* 35*  CREATININE 1.66* 1.39* 1.29*  CALCIUM 9.8 9.5 9.8  MG  --   --  2.0   GFR: Estimated Creatinine Clearance: 27.1 mL/min (by C-G formula based on SCr of 1.29 mg/dL). Liver Function Tests:  Recent Labs Lab 11/06/15 1612  AST 26  ALT 23  ALKPHOS 49  BILITOT 0.5  PROT 7.6  ALBUMIN 3.6    Recent Labs Lab 11/06/15 1612  LIPASE 21   No results for input(s): AMMONIA in the last 168 hours. Coagulation Profile: No results for input(s): INR, PROTIME in the last 168 hours. Cardiac Enzymes: No results for input(s): CKTOTAL, CKMB, CKMBINDEX, TROPONINI in the last 168 hours. BNP (last 3 results) No results for input(s): PROBNP in the last 8760 hours. HbA1C: No results for input(s): HGBA1C in the last 72 hours. CBG: No results for input(s): GLUCAP in the last 168 hours. Lipid Profile: No results for input(s): CHOL, HDL, LDLCALC, TRIG, CHOLHDL, LDLDIRECT in the last 72 hours. Thyroid  Function Tests:  Recent Labs  11/08/15 1127  TSH 51.089*  FREET4 0.73  T3FREE 1.3*   Anemia Panel: No results for input(s): VITAMINB12, FOLATE, FERRITIN, TIBC, IRON, RETICCTPCT in the last 72 hours. Sepsis Labs:  Recent Labs Lab 11/06/15 1612 11/06/15 1853  LATICACIDVEN 1.2 0.9    No results found for this or any previous visit (from the past 240 hour(s)).       Radiology Studies: Dg Chest Port 1 View  Result Date: 11/08/2015 CLINICAL DATA:  New onset shortness of Breath EXAM: PORTABLE CHEST 1 VIEW COMPARISON:  10/27/2015 FINDINGS: Cardiac shadow is mildly enlarged but stable. Stable aortic calcifications are noted. Minimal scarring is again noted in the right lung base. Small pleural effusions are again seen bilaterally. IMPRESSION: Stable appearance of the chest. Electronically Signed   By: Alcide Clever M.D.   On: 11/08/2015 09:52        Scheduled Meds: . amiodarone  100 mg Oral Daily  .  amLODipine  10 mg Oral Daily  . apixaban  2.5 mg Oral BID  . atorvastatin  10 mg Oral q1800  . feeding supplement  1 Container Oral Daily  . feeding supplement (OSMOLITE 1.5 CAL)  1,000 mL Per Tube Q2000  . feeding supplement (PRO-STAT SUGAR FREE 64)  30 mL Oral BID  . free water  60 mL Per Tube Q6H  . gabapentin  600 mg Oral BID   And  . gabapentin  300 mg Oral Q1200  . hydrALAZINE  50 mg Oral TID AC & HS  . isosorbide mononitrate  30 mg Oral Daily  . levothyroxine  125 mcg Oral QAC breakfast  . metoCLOPramide (REGLAN) injection  5 mg Intravenous Once  . mometasone-formoterol  2 puff Inhalation BID  . nystatin   Topical Daily  . pantoprazole  40 mg Oral BID  . sertraline  100 mg Oral Daily  . sodium chloride  1 g Oral TID WC   Continuous Infusions:    LOS: 0 days    Time spent: 35 mins    Shannon Cobbins, MD [facility] Pager [phone]  If 7PM-7AM, please contact night-coverage [email or web address] Password [email or web address] 11/09/2015, 10:50 AM

## 2015-11-10 ENCOUNTER — Ambulatory Visit: Payer: Self-pay | Admitting: General Surgery

## 2015-11-10 DIAGNOSIS — J4541 Moderate persistent asthma with (acute) exacerbation: Secondary | ICD-10-CM

## 2015-11-10 LAB — BASIC METABOLIC PANEL
Anion gap: 10 (ref 5–15)
BUN: 40 mg/dL — ABNORMAL HIGH (ref 6–20)
CO2: 20 mmol/L — ABNORMAL LOW (ref 22–32)
Calcium: 9.3 mg/dL (ref 8.9–10.3)
Chloride: 103 mmol/L (ref 101–111)
Creatinine, Ser: 1.34 mg/dL — ABNORMAL HIGH (ref 0.44–1.00)
GFR calc Af Amer: 39 mL/min — ABNORMAL LOW (ref >60)
GFR calc non Af Amer: 34 mL/min — ABNORMAL LOW (ref >60)
Glucose, Bld: 154 mg/dL — ABNORMAL HIGH (ref 65–99)
Potassium: 4.4 mmol/L (ref 3.5–5.1)
Sodium: 133 mmol/L — ABNORMAL LOW (ref 135–145)

## 2015-11-10 LAB — CBC
HCT: 26.4 % — ABNORMAL LOW (ref 36.0–46.0)
Hemoglobin: 9 g/dL — ABNORMAL LOW (ref 12.0–15.0)
MCH: 32.5 pg (ref 26.0–34.0)
MCHC: 34.1 g/dL (ref 30.0–36.0)
MCV: 95.3 fL (ref 78.0–100.0)
Platelets: 321 10*3/uL (ref 150–400)
RBC: 2.77 MIL/uL — ABNORMAL LOW (ref 3.87–5.11)
RDW: 16.1 % — ABNORMAL HIGH (ref 11.5–15.5)
WBC: 6.8 10*3/uL (ref 4.0–10.5)

## 2015-11-10 MED ORDER — IPRATROPIUM-ALBUTEROL 0.5-2.5 (3) MG/3ML IN SOLN: 3.0000 mL | RESPIRATORY_TRACT | Status: DC | PRN

## 2015-11-10 MED ORDER — SODIUM CHLORIDE 0.9 % IV SOLN
125.0000 mg | Freq: Four times a day (QID) | INTRAVENOUS | Status: DC
  Administered 2015-11-10 – 2015-11-11 (×4): 125 mg via INTRAVENOUS
  Filled 2015-11-10 (×9): qty 2.5

## 2015-11-10 MED ORDER — PREDNISONE 20 MG PO TABS
40.0000 mg | ORAL_TABLET | Freq: Every day | ORAL | Status: DC
  Administered 2015-11-10 – 2015-11-11 (×2): 40 mg via ORAL
  Filled 2015-11-10: qty 2

## 2015-11-10 MED ORDER — BUDESONIDE 0.25 MG/2ML IN SUSP
0.2500 mg | Freq: Two times a day (BID) | RESPIRATORY_TRACT | Status: DC
  Administered 2015-11-10 – 2015-11-11 (×2): 0.25 mg via RESPIRATORY_TRACT
  Filled 2015-11-10 (×2): qty 2

## 2015-11-10 MED ORDER — PANTOPRAZOLE SODIUM 40 MG PO TBEC
80.0000 mg | DELAYED_RELEASE_TABLET | Freq: Two times a day (BID) | ORAL | Status: DC
  Administered 2015-11-10 – 2015-11-11 (×3): 80 mg via ORAL
  Filled 2015-11-10 (×3): qty 2

## 2015-11-10 MED ORDER — IPRATROPIUM-ALBUTEROL 0.5-2.5 (3) MG/3ML IN SOLN
3.0000 mL | Freq: Two times a day (BID) | RESPIRATORY_TRACT | Status: DC
  Administered 2015-11-11: 3 mL via RESPIRATORY_TRACT
  Filled 2015-11-10: qty 3

## 2015-11-10 MED ORDER — IPRATROPIUM-ALBUTEROL 0.5-2.5 (3) MG/3ML IN SOLN
3.0000 mL | Freq: Four times a day (QID) | RESPIRATORY_TRACT | Status: DC
  Administered 2015-11-10: 3 mL via RESPIRATORY_TRACT
  Filled 2015-11-10: qty 3

## 2015-11-10 MED ORDER — ARFORMOTEROL TARTRATE 15 MCG/2ML IN NEBU
15.0000 ug | INHALATION_SOLUTION | Freq: Two times a day (BID) | RESPIRATORY_TRACT | Status: DC
  Administered 2015-11-10 – 2015-11-11 (×2): 15 ug via RESPIRATORY_TRACT
  Filled 2015-11-10 (×2): qty 2

## 2015-11-10 MED ORDER — METOCLOPRAMIDE HCL 5 MG/ML IJ SOLN
5.0000 mg | Freq: Four times a day (QID) | INTRAMUSCULAR | Status: DC
  Filled 2015-11-10: qty 2

## 2015-11-10 NOTE — Care Management Note (Signed)
Case Management Note Marvetta Gibbons RN, BSN Unit 2W-Case Manager [phone]  Patient Details  Name: Shannon [name] MRN: [id number] Date of Birth: 03-17-1924  Subjective/Objective:   Pt admitted with bradycardia                 Action/Plan: PTA pt lived at home with daughter, anticipate return home- CM to follow  Expected Discharge Date:               Expected Discharge Plan:  Home/Self Care  In-House Referral:     Discharge planning Services  CM Consult  Post Acute Care Choice:    Choice offered to:     DME Arranged:    DME Agency:     HH Arranged:    HH Agency:     Status of Service:  In process, will continue to follow  If discussed at Long Length of Stay Meetings, dates discussed:    Additional Comments:  Dawayne Patricia, RN 11/10/2015, 9:42 AM

## 2015-11-10 NOTE — Care Management Important Message (Signed)
Important Message  Patient Details  Name: Shannon [name] MRN: [id number] Date of Birth: May 02, 1923   Medicare Important Message Given:  Yes    Syvanna Ciolino, Leroy Sea 11/10/2015, 2:54 PM

## 2015-11-10 NOTE — Progress Notes (Signed)
Occupational Therapy Evaluation Patient Details Name: Shannon [name] MRN: [id number] DOB: 1923-06-28 Today's Date: 11/10/2015    History of Present Illness Pt adm with bradycardia and weakness. PMH - Gtube due to gastic obstruction, HTN, afib, MI, CVA, DM, Copd, PVD   Clinical Impression   PTA, pt was independent with ADLs and used a rollator for mobility. Pt currently requires min assist for seated LB ADLs and supervision for basic transfers. SpO2 90% on RA after ambulating down hallway (~200'); increased to 97% on RA after 2 minutes sitting in chair. Pt plans to d/c home with 24/7 assistance from her daughter/family. Pt will benefit from continued acute OT to increase independence and safety with ADLs and mobility to allow for safe discharge home. No OT follow up or DME recommended at this time.    Follow Up Recommendations  No OT follow up;Supervision/Assistance - 24 hour    Equipment Recommendations  None recommended by OT    Recommendations for Other Services       Precautions / Restrictions Precautions Precautions: Fall Restrictions Weight Bearing Restrictions: No      Mobility Bed Mobility Overal bed mobility: Needs Assistance Bed Mobility: Supine to Sit     Supine to sit: Supervision;HOB elevated     General bed mobility comments: Supervision for safety. HOB elevated 40 degrees, no use of bedrails.  Transfers Overall transfer level: Needs assistance Equipment used: Rolling walker (2 wheeled) Transfers: Sit to/from Stand Sit to Stand: Supervision         General transfer comment: Supervision for safety. SpO2 dropped to 90% on RA while ambulating down hallway; returned to 97% on RA once sitting down in recliner.    Balance Overall balance assessment: Needs assistance Sitting-balance support: No upper extremity supported;Feet supported Sitting balance-Leahy Scale: Good     Standing balance support: No upper extremity supported;During functional  activity Standing balance-Leahy Scale: Fair                              ADL Overall ADL's : Needs assistance/impaired     Grooming: Wash/dry hands;Supervision/safety;Standing   Upper Body Bathing: Supervision/ safety;Sitting   Lower Body Bathing: Minimal assistance;Sit to/from stand   Upper Body Dressing : Supervision/safety;Sitting   Lower Body Dressing: Minimal assistance;Sit to/from stand   Toilet Transfer: Supervision/safety;Ambulation;BSC;RW   Toileting- Clothing Manipulation and Hygiene: Supervision/safety;Sit to/from stand       Functional mobility during ADLs: Min guard;Rolling walker       Vision Vision Assessment?: No apparent visual deficits   Perception     Praxis      Pertinent Vitals/Pain Pain Assessment: No/denies pain     Hand Dominance Right   Extremity/Trunk Assessment Upper Extremity Assessment Upper Extremity Assessment: Overall WFL for tasks assessed   Lower Extremity Assessment Lower Extremity Assessment: Generalized weakness   Cervical / Trunk Assessment Cervical / Trunk Assessment: Kyphotic   Communication Communication Communication: No difficulties   Cognition Arousal/Alertness: Awake/alert Behavior During Therapy: WFL for tasks assessed/performed Overall Cognitive Status: Within Functional Limits for tasks assessed                     General Comments       Exercises       Shoulder Instructions      Home Living Family/patient expects to be discharged to:: Private residence Living Arrangements: Children Available Help at Discharge: Family;Available 24 hours/day Type of Home: House Home Access: Ramped entrance  Home Layout: One level     Bathroom Shower/Tub: Tub/shower unit Shower/tub characteristics: Curtain Firefighter: Standard     Home Equipment: Environmental consultant - 4 wheels;Cane - single point;Shower seat;Hand held Careers information officer - 2 wheels   Additional Comments: Lives w/ daughter,  son in Social worker, and grandson      Prior Functioning/Environment Level of Independence: Needs assistance  Gait / Transfers Assistance Needed: Uses rollator  ADL's / Homemaking Assistance Needed: Independent with all ADLs, family provides all transportation and does most homemaking tasks        OT Diagnosis: Generalized weakness   OT Problem List: Decreased strength;Impaired balance (sitting and/or standing);Decreased safety awareness   OT Treatment/Interventions: Self-care/ADL training;Therapeutic exercise;DME and/or AE instruction;Therapeutic activities;Patient/family education;Balance training    OT Goals(Current goals can be found in the care plan section) Acute Rehab OT Goals Patient Stated Goal: return home OT Goal Formulation: With patient Time For Goal Achievement: 11/24/15 Potential to Achieve Goals: Good ADL Goals Pt Will Perform Lower Body Bathing: with modified independence;sit to/from stand Pt Will Perform Lower Body Dressing: with modified independence;sit to/from stand Pt Will Perform Tub/Shower Transfer: Tub transfer;with supervision;ambulating;rolling walker;shower seat  OT Frequency: Min 2X/week   Barriers to D/C:            Co-evaluation              End of Session Equipment Utilized During Treatment: Gait belt;Rolling walker Nurse Communication: Mobility status  Activity Tolerance: Patient tolerated treatment well Patient left: in chair;with call bell/phone within reach   Time: 1325-1348 OT Time Calculation (min): 23 min Charges:  OT General Charges $OT Visit: 1 Procedure OT Evaluation $OT Eval Moderate Complexity: 1 Procedure OT Treatments $Self Care/Home Management : 8-22 mins G-Codes:    Nils Pyle, OTR/L Pager: [phone] 11/10/2015, 2:35 PM

## 2015-11-10 NOTE — Progress Notes (Signed)
Speech Language Pathology Treatment: Dysphagia  Patient Details Name: Shannon [name] MRN: [id number] DOB: 02-19-1924 Today's Date: 11/10/2015 Time: [id number] SLP Time Calculation (min) (ACUTE ONLY): 15 min  Assessment / Plan / Recommendation Clinical Impression  Dysphagia treatment provided for diet tolerance check. Pt with no overt s/s of aspiration with nectar-thick liquids but does continue to present with SOB. Min cues provided for small sip size and one sip at a time from the straw. Reviewed results of MBS with daughter and pt and rationale for nectar-thick liquids. Pt expressed not liking the texture of nectar thick consistency. Explained the increased risk of aspiration with thin liquids but also reviewed safe swallow strategies should pt choose to continue with thin liquids upon discharge- small bites/ sips, one sip at a time, "swallow hard", stay seated upright 1 hour after POs (note CXR findings of barium still in esophagus). Also educated on free water protocol. Pt and daughter also unsure about continuing with home health speech therapy for dysphagia; explained that pt may benefit from it for strengthening hyolaryngeal muscles and strategy training. Will continue to follow for continued diet tolerance and education.    HPI HPI: Pt is a 82 y.o. female with PMH of Afib on apixaban and amiodarone, remote CVA, HTN, SIADH, hypothyroidism, anemia, CKD, and gastric outlet obstruction with recent PEG tube who presented to ED on 8/5 with weak spells and bradycardia, increasing in frequency, along with transient R side weakness. Reportedly has G-tube but takes meds and liquids by mouth. Pt has been followed by SLP on previous admissions- most recent recommendation in 2016 was full liquid (nectar) allowing tsp of thin liquid sips as well. Head CT clear, abdominal CT showed significant reduction in the gastric distention status postgastrostomy, with associated enteric tube passing through the stomach with tip  in the descending duodenum; small L pleural effusion and mild bibasilar atelectasis. Bedside swallow eval ordered.      SLP Plan  Continue with current plan of care     Recommendations  Diet recommendations: Nectar-thick liquid Liquids provided via: Cup;Straw Medication Administration: Crushed with puree Supervision: Patient able to self feed;Intermittent supervision to cue for compensatory strategies Compensations: Slow rate;Small sips/bites Postural Changes and/or Swallow Maneuvers: Seated upright 90 degrees;Upright 30-60 min after meal             Oral Care Recommendations: Oral care BID Plan: Continue with current plan of care     Gl[place], Amy K, [place], CCC-SLP 11/10/2015, 12:23 PM [phone]

## 2015-11-10 NOTE — Progress Notes (Signed)
Patient Profile: 82 year old female with a significant past medical history of paroxysmal atrial fibrillation/flutter on amiodarone, beta-blocker intolerance, non-obstructive coronary artery disease, type 2 diabetes, hypertension, cerebro-vascular disease, COPD, hypothyroidism, SIADH, and status post GJ tube is here for weakness. Cardiology was consulted for symptomatic bradycardia.  Subjective: She is labored breathing today especially on expiration and wheezing. She feels better than yesterday but is not back to baseline.   Objective: Vital signs in last 24 hours: Temp:  [97.7 F (36.5 C)-98.3 F (36.8 C)] 97.9 F (36.6 C) (08/09 0503) Pulse Rate:  [64-71] 64 (08/09 0503) Resp:  [12-18] 12 (08/09 0503) BP: (122-150)/(51-72) 150/72 (08/09 0939) SpO2:  [97 %-100 %] 100 % (08/09 0503) Last BM Date: 11/09/15  Intake/Output from previous day: 08/08 0701 - 08/09 0700 In: 720 [P.O.:720] Out: 452 [Urine:450; Stool:2] Intake/Output this shift: No intake/output data recorded.  Medications Current Facility-Administered Medications  Medication Dose Route Frequency Provider Last Rate Last Dose  . acetaminophen (TYLENOL) tablet 650 mg  650 mg Oral Q6H PRN Edwin Dada, MD   650 mg at 11/07/15 [id number]   Or  . acetaminophen (TYLENOL) suppository 650 mg  650 mg Rectal Q6H PRN Edwin Dada, MD      . albuterol (PROVENTIL) (2.5 MG/3ML) 0.083% nebulizer solution 2.5 mg  2.5 mg Inhalation Q6H PRN Edwin Dada, MD      . amiodarone (PACERONE) tablet 100 mg  100 mg Oral Daily Edwin Dada, MD   100 mg at 11/10/15 [id number]  . amLODipine (NORVASC) tablet 10 mg  10 mg Oral Daily Edwin Dada, MD   10 mg at 11/10/15 0940  . apixaban (ELIQUIS) tablet 2.5 mg  2.5 mg Oral BID Edwin Dada, MD   2.5 mg at 11/10/15 0941  . atorvastatin (LIPITOR) tablet 10 mg  10 mg Oral q1800 Edwin Dada, MD   10 mg at 11/09/15 1708  . bisacodyl (DULCOLAX)  suppository 10 mg  10 mg Rectal Daily PRN Edwin Dada, MD      . cycloSPORINE (RESTASIS) 0.05 % ophthalmic emulsion 1 drop  1 drop Both Eyes BID Eugenie Filler, MD   1 drop at 11/10/15 [phone]  . feeding supplement (BOOST / RESOURCE BREEZE) liquid 1 Container  1 Container Oral Daily Debbe Odea, MD   1 Container at 11/10/15 1000  . feeding supplement (OSMOLITE 1.5 CAL) liquid 1,000 mL  1,000 mL Per Tube Q2000 Debbe Odea, MD 50 mL/hr at 11/09/15 2015 1,000 mL at 11/09/15 2015  . feeding supplement (PRO-STAT SUGAR FREE 64) liquid 30 mL  30 mL Oral BID Edwin Dada, MD   30 mL at 11/10/15 0941  . free water 60 mL  60 mL Per Tube Q6H Edwin Dada, MD   60 mL at 11/10/15 0000  . gabapentin (NEURONTIN) tablet 600 mg  600 mg Oral BID Edwin Dada, MD   600 mg at 11/10/15 [id number]   And  . gabapentin (NEURONTIN) capsule 300 mg  300 mg Oral Q1200 Edwin Dada, MD   300 mg at 11/09/15 1216  . hydrALAZINE (APRESOLINE) tablet 50 mg  50 mg Oral TID AC & HS Edwin Dada, MD   50 mg at 11/10/15 0939  . ipratropium-albuterol (DUONEB) 0.5-2.5 (3) MG/3ML nebulizer solution 3 mL  3 mL Nebulization Q4H PRN Eugenie Filler, MD      . isosorbide mononitrate (IMDUR) 24 hr tablet 30 mg  30 mg Oral  Daily Edwin Dada, MD   30 mg at 11/10/15 [id number]  . latanoprost (XALATAN) 0.005 % ophthalmic solution 1 drop  1 drop Both Eyes QHS Eugenie Filler, MD   1 drop at 11/09/15 2105  . levothyroxine (SYNTHROID, LEVOTHROID) tablet 125 mcg  125 mcg Oral QAC breakfast Dorothy Spark, MD   125 mcg at 11/10/15 0654  . metoCLOPramide (REGLAN) injection 5 mg  5 mg Intravenous Q6H Eugenie Filler, MD      . mometasone-formoterol [facility]) 200-5 MCG/ACT inhaler 2 puff  2 puff Inhalation BID Edwin Dada, MD   2 puff at 11/09/15 2044  . nystatin (MYCOSTATIN/NYSTOP) topical powder   Topical Daily Edwin Dada, MD      . pantoprazole (PROTONIX) EC tablet 80 mg   80 mg Oral BID Eugenie Filler, MD   80 mg at 11/10/15 [id number]  . polyethylene glycol (MIRALAX / GLYCOLAX) packet 17 g  17 g Oral BID PRN Edwin Dada, MD      . polyvinyl alcohol (LIQUIFILM TEARS) 1.4 % ophthalmic solution 1 drop  1 drop Both Eyes QID Eugenie Filler, MD   1 drop at 11/10/15 [phone]  . [place]   Oral PRN Eugenie Filler, MD      . sertraline (ZOLOFT) tablet 100 mg  100 mg Oral Daily Edwin Dada, MD   100 mg at 11/10/15 0941  . simethicone (MYLICON) chewable tablet 80 mg  80 mg Oral QID PRN Edwin Dada, MD      . sodium chloride tablet 1 g  1 g Oral TID WC Edwin Dada, MD   1 g at 11/10/15 0654  . traMADol (ULTRAM) tablet 50 mg  50 mg Oral Daily PRN Edwin Dada, MD   50 mg at 11/07/15 0043    PE: General appearance: alert, cooperative and elderly and frail Neck: no carotid bruit and no JVD Lungs: faint wheezes bilaterally Heart: regular rate and rhythm Extremities: no LEE Pulses: 2+ and symmetric Skin: warm and dry Neurologic: Grossly normal  Lab Results:   Recent Labs  11/09/15 0423 11/10/15 0334  WBC 6.3 6.8  HGB 9.2* 9.0*  HCT 27.7* 26.4*  PLT 332 321   BMET  Recent Labs  11/08/15 0739 11/09/15 0854 11/10/15 0334  NA 134* 133* 133*  K 4.8 4.8 4.4  CL 104 103 103  CO2 19* 20* 20*  GLUCOSE 122* 135* 154*  BUN 36* 35* 40*  CREATININE 1.39* 1.29* 1.34*  CALCIUM 9.5 9.8 9.3     Assessment/Plan  Principal Problem:   Bradycardia Active Problems:   Essential hypertension, benign   A-fib (HCC)   Hypothyroidism   Anemia of chronic disease   CKD (chronic kidney disease) stage 3, GFR 30-59 ml/min   Sinus arrest   Weakness   Hypothyroidism due to medication  1. Brady-arrhythmia: Likely multifactorial (hypothyroidism and labetalol) use in the setting of a 82 year women. Patient with symptoms that are likely related to her bradycardia. She appears to be hemodynamically stable at  this time.  - Would not recommend a pacemaker at this time was noted on admit.  - Manage hypothyroidism accordingly (on levothyroxine that was increased to 125 mcg)  - labetalol discontinued, bradycardia and junctional rhythm has resolved. - HR currently in the 60s  2. Asthma exacerbation - with wheezing, post use of steroid 2 weeks ago, I will start prednisone 40 mg po daily x 3 days for ongoing  SOB and wheezing  3. Hypertension: Patient with a known history of essential hypertension with blood pressure at goal based on age. She does have renal failure in the setting of hypertension and diabetes.  - No changes in home blood pressure medications except for stopping labetalol, due to bradycardia.  4. Paroxysmal atrial fibrillation Patient with a known history of paroxysmal atrial fibrillation/flutter. ECG does not show atrial fibrillation at this time. She is currently on amiodarone for rhythm control and taking Apixaban for secondary prevention of stroke. - Continue apixaban and Amiodarone for now.   4. HYPOTHYROIDISM:  On levothyroxine. TSH is 51 however Free T3, T4 WNL. Levothyroxine recently increased. Now on 125 mcg daily. Plan is to reduce dose down to 112 mcg given age (was previously on 88 mcg daily before increase).   5. CODE Status:  Pt is partial DNR     LOS: 1 day  Ena Dawley 11/10/2015 11:33 AM

## 2015-11-10 NOTE — Progress Notes (Signed)
PROGRESS NOTE    Shannon [name]  [id number] DOB: 1923/09/08 DOA: 11/06/2015 PCP: Gildardo Cranker, DO   Brief Narrative:  Shannon [name] is an 82 y.o. female past medical history of A. fib and apixiban, amiodarone, essential hypertension, normocytic anemia chronic and kidney disease with a recent PEG tube due to gastric outlet obstruction comes in for several spells of weakness and sweating in the last 2 or 3 days and was found to be severely bradycardic in the ED.,    Assessment & Plan:   Principal Problem:   Bradycardia Active Problems:   Essential hypertension, benign   A-fib (HCC)   Hypothyroidism   Anemia of chronic disease   CKD (chronic kidney disease) stage 3, GFR 30-59 ml/min   Sinus arrest   Weakness   Hypothyroidism due to medication  #1 symptomatic bradycardia Likely medication induced. Patient's labetalol on hold. Patient's TSH elevated at 51.089 with a free T4 of 0.73 and a T3 of 1.3. Patient's Synthroid was initially increased to 179mcg daily which has subsequently been reduced to 125 MCG's daily per cardiology for the next 2 days and then decrease to 112 MCG's daily. Patient currently stable. Cardiology not recommending a pacemaker at this time. Amiodarone dose being decreased per cardiology. Cardiology following and appreciate input and recommendations.  #2 hypothyroidism TSH noted to be a 51.089 improved from 81.690 on 10/06/2015. Patient's Synthroid dose was initially increased to 150 MCG's daily however has been reduced to 125 MCG's daily per cardiology and recommended decreasing further to 112 MCG's daily after a few days. Patient will likely need repeat thyroid function studies done in about 4-6 weeks as outpatient.  #3 hypertension Stable. Continue current regimen of Norvasc, hydralazine,imdur.. Follow.  #4 chronic atrial fibrillation Patient with history of atrial fibrillation. Continue amiodarone for rhythm control. Currently rate controlled. Apixaban for  anticoagulation.  #5 chronic kidney disease stage III Stable.  #6 Anemia of chronic disease H&H stable.  #7 gastric outlet obstruction status post PEG placement Patient with complaints of increasing saliva which have improved since yesterday after patient was given a dose of IV Reglan. Likely secondary to esophageal dysmotility and reflux as chest x-ray did show retained barium in the esophagus likely secondary to esophageal dysmotility. Modified barium swallow with moderate risk of aspiration. Patient with upper endoscopy 09/15/2015 with retained gastric fluids, pyloric stenosis, component of gastroparesis noted. IV Reglan every 6 hours was recommended however patient and family hesitant to be placed back on Reglan due to side effects and underlying Parkinson's per daughter. Tube feeds currently at goal at 60 mL per hour. Patient getting tube feeds at bedtime only. Continue current diet of full liquids with nectar thick liquids. Will discontinue IV Reglan due to family concerns and place patient on IV erythromycin and increase PPI to 80 mg twice daily.   #8 shortness of breath/COPD exacerbation versus asthma exacerbation Patient states improvement with shortness of breath from yesterday. Lung exam unremarkable. Patient speaking in full sentences. Repeat chest x-ray with no acute infiltrate, stable small bilateral pleural effusions and cardiomegaly, retained barium in the esophagus likely secondary to esophageal dysmotility. Reflux could also produce this appearance. Continue dulera. Nebulizer treatments as needed. Prednisone has been added per cardiology. Will add Pulmicort and Brovana and scheduled nebulizer treatments. If no significant improvement of shortness of breath may consider CT chest as patient is on amiodarone due to possible amiodarone toxicity.    DVT prophylaxis: eliquis Code Status: Partial Family Communication: Updated patient and daughter at bedside. Disposition  PROGRESS NOTE    Shannon [name]  [id number] DOB: 1923/09/08 DOA: 11/06/2015 PCP: Gildardo Cranker, DO   Brief Narrative:  Shannon [name] is an 82 y.o. female past medical history of A. fib and apixiban, amiodarone, essential hypertension, normocytic anemia chronic and kidney disease with a recent PEG tube due to gastric outlet obstruction comes in for several spells of weakness and sweating in the last 2 or 3 days and was found to be severely bradycardic in the ED.,    Assessment & Plan:   Principal Problem:   Bradycardia Active Problems:   Essential hypertension, benign   A-fib (HCC)   Hypothyroidism   Anemia of chronic disease   CKD (chronic kidney disease) stage 3, GFR 30-59 ml/min   Sinus arrest   Weakness   Hypothyroidism due to medication  #1 symptomatic bradycardia Likely medication induced. Patient's labetalol on hold. Patient's TSH elevated at 51.089 with a free T4 of 0.73 and a T3 of 1.3. Patient's Synthroid was initially increased to 179mcg daily which has subsequently been reduced to 125 MCG's daily per cardiology for the next 2 days and then decrease to 112 MCG's daily. Patient currently stable. Cardiology not recommending a pacemaker at this time. Amiodarone dose being decreased per cardiology. Cardiology following and appreciate input and recommendations.  #2 hypothyroidism TSH noted to be a 51.089 improved from 81.690 on 10/06/2015. Patient's Synthroid dose was initially increased to 150 MCG's daily however has been reduced to 125 MCG's daily per cardiology and recommended decreasing further to 112 MCG's daily after a few days. Patient will likely need repeat thyroid function studies done in about 4-6 weeks as outpatient.  #3 hypertension Stable. Continue current regimen of Norvasc, hydralazine,imdur.. Follow.  #4 chronic atrial fibrillation Patient with history of atrial fibrillation. Continue amiodarone for rhythm control. Currently rate controlled. Apixaban for  anticoagulation.  #5 chronic kidney disease stage III Stable.  #6 Anemia of chronic disease H&H stable.  #7 gastric outlet obstruction status post PEG placement Patient with complaints of increasing saliva which have improved since yesterday after patient was given a dose of IV Reglan. Likely secondary to esophageal dysmotility and reflux as chest x-ray did show retained barium in the esophagus likely secondary to esophageal dysmotility. Modified barium swallow with moderate risk of aspiration. Patient with upper endoscopy 09/15/2015 with retained gastric fluids, pyloric stenosis, component of gastroparesis noted. IV Reglan every 6 hours was recommended however patient and family hesitant to be placed back on Reglan due to side effects and underlying Parkinson's per daughter. Tube feeds currently at goal at 60 mL per hour. Patient getting tube feeds at bedtime only. Continue current diet of full liquids with nectar thick liquids. Will discontinue IV Reglan due to family concerns and place patient on IV erythromycin and increase PPI to 80 mg twice daily.   #8 shortness of breath/COPD exacerbation versus asthma exacerbation Patient states improvement with shortness of breath from yesterday. Lung exam unremarkable. Patient speaking in full sentences. Repeat chest x-ray with no acute infiltrate, stable small bilateral pleural effusions and cardiomegaly, retained barium in the esophagus likely secondary to esophageal dysmotility. Reflux could also produce this appearance. Continue dulera. Nebulizer treatments as needed. Prednisone has been added per cardiology. Will add Pulmicort and Brovana and scheduled nebulizer treatments. If no significant improvement of shortness of breath may consider CT chest as patient is on amiodarone due to possible amiodarone toxicity.    DVT prophylaxis: eliquis Code Status: Partial Family Communication: Updated patient and daughter at bedside. Disposition  Function Tests:  Recent Labs  11/08/15 1127  TSH 51.089*  FREET4 0.73  T3FREE 1.3*   Anemia Panel: No results for input(s): VITAMINB12, FOLATE, FERRITIN, TIBC, IRON, RETICCTPCT in the last 72 hours. Sepsis Labs:  Recent Labs Lab 11/06/15 1612 11/06/15 1853  LATICACIDVEN 1.2 0.9    No results found for this or any previous visit (from the past 240 hour(s)).       Radiology Studies: Dg Chest 2 View  Result Date: 11/09/2015 CLINICAL DATA:  Shortness of breath. Swallowing function examination immediately prior to the current examination. EXAM: CHEST  2 VIEW COMPARISON:  11/08/2015. FINDINGS: Stable enlarged cardiac silhouette and linear scarring at the right lung base. Small bilateral pleural effusions are again demonstrated. There is retained barium in the esophagus. Aortic calcifications are noted as well as bilateral shoulder degenerative changes. IMPRESSION: 1.  Stable small bilateral pleural effusions and cardiomegaly. 2. Retained barium in the esophagus. This is most likely due to esophageal dysmotility. Reflux could also produce this appearance. Electronically Signed   By: Claudie Revering M.D.   On: 11/09/2015 13:51   Dg Swallowing Func-speech Pathology  Result Date: 11/09/2015 Objective Swallowing Evaluation: Type of Study: MBS-Modified Barium Swallow Study Patient Details Name: Florice Philipp MRN: [id number] Date of Birth: Feb 02, 1924 Today's Date: 11/09/2015 Time: SLP Start Time (ACUTE ONLY): 1305-SLP Stop Time (ACUTE ONLY): 1330 SLP Time Calculation (min) (ACUTE ONLY): 25 min Past Medical History: Past Medical History: Diagnosis Date . Anemia   previous blood transfusions . Arthritis   "all over" . Asthma  . Bradycardia   topped BB and amiodarone decreased- junctional rhythm  . COPD (chronic obstructive pulmonary disease) ([place])  . Depression   "light case" . Gastric stenosis  . GERD (gastroesophageal reflux disease)  . History of stomach ulcers  . Hyperlipidemia  . Hypertension  . Hypothyroidism  . Paraesophageal hernia  . Perforated gastric ulcer ([place])  . Pneumonia  . Renal insufficiency  . SIADH (syndrome of inappropriate ADH production) ([place])   [name] 01/10/2015 . Small bowel obstruction ([place])   "I don't know how many" (01/11/2015) . Stroke ([place])   "light one" . Type II diabetes mellitus ([place])  . Ventral hernia with bowel obstruction  Past Surgical History: Past Surgical History: Procedure Laterality Date . CATARACT EXTRACTION W/ INTRAOCULAR LENS  IMPLANT, BILATERAL   . CHOLECYSTECTOMY OPEN   . COLECTOMY   . ESOPHAGOGASTRODUODENOSCOPY N/A 01/19/2014  Procedure: ESOPHAGOGASTRODUODENOSCOPY (EGD);  Surgeon: Irene Shipper, MD;  Location: [facility];  Service: Endoscopy;  Laterality: N/A; . ESOPHAGOGASTRODUODENOSCOPY N/A 01/20/2014  Procedure: ESOPHAGOGASTRODUODENOSCOPY (EGD);  Surgeon: Irene Shipper, MD;  Location: [facility];  Service: Endoscopy;  Laterality: N/A; .  ESOPHAGOGASTRODUODENOSCOPY N/A 03/19/2014  Procedure: ESOPHAGOGASTRODUODENOSCOPY (EGD);  Surgeon: Milus Banister, MD;  Location: [facility];  Service: Endoscopy;  Laterality: N/A; . ESOPHAGOGASTRODUODENOSCOPY N/A 07/08/2015  Procedure: ESOPHAGOGASTRODUODENOSCOPY (EGD);  Surgeon: Doran Stabler, MD;  Location: [facility] ENDOSCOPY;  Service: Endoscopy;  Laterality: N/A; . ESOPHAGOGASTRODUODENOSCOPY (EGD) WITH PROPOFOL N/A 09/15/2015  Procedure: ESOPHAGOGASTRODUODENOSCOPY (EGD) WITH PROPOFOL;  Surgeon: Manus Gunning, MD;  Location: [facility] ENDOSCOPY;  Service: Gastroenterology;  Laterality: N/A; . GASTROJEJUNOSTOMY    hx/notes 01/10/2015 . GASTROJEJUNOSTOMY N/A 09/23/2015  Procedure: OPEN GASTROJEJUNOSTOMY TUBE PLACEMENT;  Surgeon: Arta Bruce Kinsinger, MD;  Location: [facility] OR[facility];  Service: General;  Laterality: N/A; . HERNIA REPAIR  2015 . LAPAROTOMY N/A 01/20/2015  Procedure: EXPLORATORY LAPAROTOMY;  Surgeon: Coralie Keens, MD;  Location: [place];  Service: General;  Laterality: N/A; . LYSIS OF  Function Tests:  Recent Labs  11/08/15 1127  TSH 51.089*  FREET4 0.73  T3FREE 1.3*   Anemia Panel: No results for input(s): VITAMINB12, FOLATE, FERRITIN, TIBC, IRON, RETICCTPCT in the last 72 hours. Sepsis Labs:  Recent Labs Lab 11/06/15 1612 11/06/15 1853  LATICACIDVEN 1.2 0.9    No results found for this or any previous visit (from the past 240 hour(s)).       Radiology Studies: Dg Chest 2 View  Result Date: 11/09/2015 CLINICAL DATA:  Shortness of breath. Swallowing function examination immediately prior to the current examination. EXAM: CHEST  2 VIEW COMPARISON:  11/08/2015. FINDINGS: Stable enlarged cardiac silhouette and linear scarring at the right lung base. Small bilateral pleural effusions are again demonstrated. There is retained barium in the esophagus. Aortic calcifications are noted as well as bilateral shoulder degenerative changes. IMPRESSION: 1.  Stable small bilateral pleural effusions and cardiomegaly. 2. Retained barium in the esophagus. This is most likely due to esophageal dysmotility. Reflux could also produce this appearance. Electronically Signed   By: Claudie Revering M.D.   On: 11/09/2015 13:51   Dg Swallowing Func-speech Pathology  Result Date: 11/09/2015 Objective Swallowing Evaluation: Type of Study: MBS-Modified Barium Swallow Study Patient Details Name: Florice Philipp MRN: [id number] Date of Birth: Feb 02, 1924 Today's Date: 11/09/2015 Time: SLP Start Time (ACUTE ONLY): 1305-SLP Stop Time (ACUTE ONLY): 1330 SLP Time Calculation (min) (ACUTE ONLY): 25 min Past Medical History: Past Medical History: Diagnosis Date . Anemia   previous blood transfusions . Arthritis   "all over" . Asthma  . Bradycardia   topped BB and amiodarone decreased- junctional rhythm  . COPD (chronic obstructive pulmonary disease) ([place])  . Depression   "light case" . Gastric stenosis  . GERD (gastroesophageal reflux disease)  . History of stomach ulcers  . Hyperlipidemia  . Hypertension  . Hypothyroidism  . Paraesophageal hernia  . Perforated gastric ulcer ([place])  . Pneumonia  . Renal insufficiency  . SIADH (syndrome of inappropriate ADH production) ([place])   [name] 01/10/2015 . Small bowel obstruction ([place])   "I don't know how many" (01/11/2015) . Stroke ([place])   "light one" . Type II diabetes mellitus ([place])  . Ventral hernia with bowel obstruction  Past Surgical History: Past Surgical History: Procedure Laterality Date . CATARACT EXTRACTION W/ INTRAOCULAR LENS  IMPLANT, BILATERAL   . CHOLECYSTECTOMY OPEN   . COLECTOMY   . ESOPHAGOGASTRODUODENOSCOPY N/A 01/19/2014  Procedure: ESOPHAGOGASTRODUODENOSCOPY (EGD);  Surgeon: Irene Shipper, MD;  Location: [facility];  Service: Endoscopy;  Laterality: N/A; . ESOPHAGOGASTRODUODENOSCOPY N/A 01/20/2014  Procedure: ESOPHAGOGASTRODUODENOSCOPY (EGD);  Surgeon: Irene Shipper, MD;  Location: [facility];  Service: Endoscopy;  Laterality: N/A; .  ESOPHAGOGASTRODUODENOSCOPY N/A 03/19/2014  Procedure: ESOPHAGOGASTRODUODENOSCOPY (EGD);  Surgeon: Milus Banister, MD;  Location: [facility];  Service: Endoscopy;  Laterality: N/A; . ESOPHAGOGASTRODUODENOSCOPY N/A 07/08/2015  Procedure: ESOPHAGOGASTRODUODENOSCOPY (EGD);  Surgeon: Doran Stabler, MD;  Location: [facility] ENDOSCOPY;  Service: Endoscopy;  Laterality: N/A; . ESOPHAGOGASTRODUODENOSCOPY (EGD) WITH PROPOFOL N/A 09/15/2015  Procedure: ESOPHAGOGASTRODUODENOSCOPY (EGD) WITH PROPOFOL;  Surgeon: Manus Gunning, MD;  Location: [facility] ENDOSCOPY;  Service: Gastroenterology;  Laterality: N/A; . GASTROJEJUNOSTOMY    hx/notes 01/10/2015 . GASTROJEJUNOSTOMY N/A 09/23/2015  Procedure: OPEN GASTROJEJUNOSTOMY TUBE PLACEMENT;  Surgeon: Arta Bruce Kinsinger, MD;  Location: [facility];  Service: General;  Laterality: N/A; . HERNIA REPAIR  2015 . LAPAROTOMY N/A 01/20/2015  Procedure: EXPLORATORY LAPAROTOMY;  Surgeon: Coralie Keens, MD;  Location: [place];  Service: General;  Laterality: N/A; . LYSIS OF  PROGRESS NOTE    Shannon [name]  [id number] DOB: 1923/09/08 DOA: 11/06/2015 PCP: Gildardo Cranker, DO   Brief Narrative:  Shannon [name] is an 82 y.o. female past medical history of A. fib and apixiban, amiodarone, essential hypertension, normocytic anemia chronic and kidney disease with a recent PEG tube due to gastric outlet obstruction comes in for several spells of weakness and sweating in the last 2 or 3 days and was found to be severely bradycardic in the ED.,    Assessment & Plan:   Principal Problem:   Bradycardia Active Problems:   Essential hypertension, benign   A-fib (HCC)   Hypothyroidism   Anemia of chronic disease   CKD (chronic kidney disease) stage 3, GFR 30-59 ml/min   Sinus arrest   Weakness   Hypothyroidism due to medication  #1 symptomatic bradycardia Likely medication induced. Patient's labetalol on hold. Patient's TSH elevated at 51.089 with a free T4 of 0.73 and a T3 of 1.3. Patient's Synthroid was initially increased to 179mcg daily which has subsequently been reduced to 125 MCG's daily per cardiology for the next 2 days and then decrease to 112 MCG's daily. Patient currently stable. Cardiology not recommending a pacemaker at this time. Amiodarone dose being decreased per cardiology. Cardiology following and appreciate input and recommendations.  #2 hypothyroidism TSH noted to be a 51.089 improved from 81.690 on 10/06/2015. Patient's Synthroid dose was initially increased to 150 MCG's daily however has been reduced to 125 MCG's daily per cardiology and recommended decreasing further to 112 MCG's daily after a few days. Patient will likely need repeat thyroid function studies done in about 4-6 weeks as outpatient.  #3 hypertension Stable. Continue current regimen of Norvasc, hydralazine,imdur.. Follow.  #4 chronic atrial fibrillation Patient with history of atrial fibrillation. Continue amiodarone for rhythm control. Currently rate controlled. Apixaban for  anticoagulation.  #5 chronic kidney disease stage III Stable.  #6 Anemia of chronic disease H&H stable.  #7 gastric outlet obstruction status post PEG placement Patient with complaints of increasing saliva which have improved since yesterday after patient was given a dose of IV Reglan. Likely secondary to esophageal dysmotility and reflux as chest x-ray did show retained barium in the esophagus likely secondary to esophageal dysmotility. Modified barium swallow with moderate risk of aspiration. Patient with upper endoscopy 09/15/2015 with retained gastric fluids, pyloric stenosis, component of gastroparesis noted. IV Reglan every 6 hours was recommended however patient and family hesitant to be placed back on Reglan due to side effects and underlying Parkinson's per daughter. Tube feeds currently at goal at 60 mL per hour. Patient getting tube feeds at bedtime only. Continue current diet of full liquids with nectar thick liquids. Will discontinue IV Reglan due to family concerns and place patient on IV erythromycin and increase PPI to 80 mg twice daily.   #8 shortness of breath/COPD exacerbation versus asthma exacerbation Patient states improvement with shortness of breath from yesterday. Lung exam unremarkable. Patient speaking in full sentences. Repeat chest x-ray with no acute infiltrate, stable small bilateral pleural effusions and cardiomegaly, retained barium in the esophagus likely secondary to esophageal dysmotility. Reflux could also produce this appearance. Continue dulera. Nebulizer treatments as needed. Prednisone has been added per cardiology. Will add Pulmicort and Brovana and scheduled nebulizer treatments. If no significant improvement of shortness of breath may consider CT chest as patient is on amiodarone due to possible amiodarone toxicity.    DVT prophylaxis: eliquis Code Status: Partial Family Communication: Updated patient and daughter at bedside. Disposition  PROGRESS NOTE    Shannon [name]  [id number] DOB: 1923/09/08 DOA: 11/06/2015 PCP: Gildardo Cranker, DO   Brief Narrative:  Shannon [name] is an 82 y.o. female past medical history of A. fib and apixiban, amiodarone, essential hypertension, normocytic anemia chronic and kidney disease with a recent PEG tube due to gastric outlet obstruction comes in for several spells of weakness and sweating in the last 2 or 3 days and was found to be severely bradycardic in the ED.,    Assessment & Plan:   Principal Problem:   Bradycardia Active Problems:   Essential hypertension, benign   A-fib (HCC)   Hypothyroidism   Anemia of chronic disease   CKD (chronic kidney disease) stage 3, GFR 30-59 ml/min   Sinus arrest   Weakness   Hypothyroidism due to medication  #1 symptomatic bradycardia Likely medication induced. Patient's labetalol on hold. Patient's TSH elevated at 51.089 with a free T4 of 0.73 and a T3 of 1.3. Patient's Synthroid was initially increased to 179mcg daily which has subsequently been reduced to 125 MCG's daily per cardiology for the next 2 days and then decrease to 112 MCG's daily. Patient currently stable. Cardiology not recommending a pacemaker at this time. Amiodarone dose being decreased per cardiology. Cardiology following and appreciate input and recommendations.  #2 hypothyroidism TSH noted to be a 51.089 improved from 81.690 on 10/06/2015. Patient's Synthroid dose was initially increased to 150 MCG's daily however has been reduced to 125 MCG's daily per cardiology and recommended decreasing further to 112 MCG's daily after a few days. Patient will likely need repeat thyroid function studies done in about 4-6 weeks as outpatient.  #3 hypertension Stable. Continue current regimen of Norvasc, hydralazine,imdur.. Follow.  #4 chronic atrial fibrillation Patient with history of atrial fibrillation. Continue amiodarone for rhythm control. Currently rate controlled. Apixaban for  anticoagulation.  #5 chronic kidney disease stage III Stable.  #6 Anemia of chronic disease H&H stable.  #7 gastric outlet obstruction status post PEG placement Patient with complaints of increasing saliva which have improved since yesterday after patient was given a dose of IV Reglan. Likely secondary to esophageal dysmotility and reflux as chest x-ray did show retained barium in the esophagus likely secondary to esophageal dysmotility. Modified barium swallow with moderate risk of aspiration. Patient with upper endoscopy 09/15/2015 with retained gastric fluids, pyloric stenosis, component of gastroparesis noted. IV Reglan every 6 hours was recommended however patient and family hesitant to be placed back on Reglan due to side effects and underlying Parkinson's per daughter. Tube feeds currently at goal at 60 mL per hour. Patient getting tube feeds at bedtime only. Continue current diet of full liquids with nectar thick liquids. Will discontinue IV Reglan due to family concerns and place patient on IV erythromycin and increase PPI to 80 mg twice daily.   #8 shortness of breath/COPD exacerbation versus asthma exacerbation Patient states improvement with shortness of breath from yesterday. Lung exam unremarkable. Patient speaking in full sentences. Repeat chest x-ray with no acute infiltrate, stable small bilateral pleural effusions and cardiomegaly, retained barium in the esophagus likely secondary to esophageal dysmotility. Reflux could also produce this appearance. Continue dulera. Nebulizer treatments as needed. Prednisone has been added per cardiology. Will add Pulmicort and Brovana and scheduled nebulizer treatments. If no significant improvement of shortness of breath may consider CT chest as patient is on amiodarone due to possible amiodarone toxicity.    DVT prophylaxis: eliquis Code Status: Partial Family Communication: Updated patient and daughter at bedside. Disposition

## 2015-11-11 ENCOUNTER — Ambulatory Visit: Payer: Medicare Other | Admitting: Physician Assistant

## 2015-11-11 LAB — BASIC METABOLIC PANEL
Anion gap: 9 (ref 5–15)
BUN: 39 mg/dL — ABNORMAL HIGH (ref 6–20)
CO2: 20 mmol/L — ABNORMAL LOW (ref 22–32)
Calcium: 9.1 mg/dL (ref 8.9–10.3)
Chloride: 104 mmol/L (ref 101–111)
Creatinine, Ser: 1.36 mg/dL — ABNORMAL HIGH (ref 0.44–1.00)
GFR calc Af Amer: 38 mL/min — ABNORMAL LOW (ref >60)
GFR calc non Af Amer: 33 mL/min — ABNORMAL LOW (ref >60)
Glucose, Bld: 157 mg/dL — ABNORMAL HIGH (ref 65–99)
Potassium: 4.2 mmol/L (ref 3.5–5.1)
Sodium: 133 mmol/L — ABNORMAL LOW (ref 135–145)

## 2015-11-11 LAB — CBC
HCT: 24.7 % — ABNORMAL LOW (ref 36.0–46.0)
Hemoglobin: 8.3 g/dL — ABNORMAL LOW (ref 12.0–15.0)
MCH: 32.2 pg (ref 26.0–34.0)
MCHC: 33.6 g/dL (ref 30.0–36.0)
MCV: 95.7 fL (ref 78.0–100.0)
Platelets: 287 10*3/uL (ref 150–400)
RBC: 2.58 MIL/uL — ABNORMAL LOW (ref 3.87–5.11)
RDW: 16.2 % — ABNORMAL HIGH (ref 11.5–15.5)
WBC: 8.6 10*3/uL (ref 4.0–10.5)

## 2015-11-11 LAB — MAGNESIUM: Magnesium: 1.8 mg/dL (ref 1.7–2.4)

## 2015-11-11 MED ORDER — LEVOTHYROXINE SODIUM 112 MCG PO TABS: 112.0000 ug | ORAL_TABLET | Freq: Every day | ORAL | 3 refills | Status: DC

## 2015-11-11 MED ORDER — HYDRALAZINE HCL 50 MG PO TABS: 50.0000 mg | ORAL_TABLET | Freq: Three times a day (TID) | ORAL | 0 refills | Status: DC

## 2015-11-11 MED ORDER — IPRATROPIUM-ALBUTEROL 0.5-2.5 (3) MG/3ML IN SOLN: 3.0000 mL | Freq: Four times a day (QID) | RESPIRATORY_TRACT | 4 refills | Status: DC | PRN

## 2015-11-11 MED ORDER — PREDNISONE 20 MG PO TABS: 20.0000 mg | ORAL_TABLET | Freq: Every day | ORAL | 0 refills | Status: DC

## 2015-11-11 MED ORDER — RESOURCE THICKENUP CLEAR PO POWD: 1.0000 | ORAL | 0 refills | Status: DC | PRN

## 2015-11-11 MED ORDER — SODIUM CHLORIDE 1 G PO TABS: ORAL_TABLET | ORAL | 2 refills | Status: DC

## 2015-11-11 MED ORDER — ERYTHROMYCIN BASE 250 MG PO TABS: 125.0000 mg | ORAL_TABLET | Freq: Four times a day (QID) | ORAL | 3 refills | Status: DC

## 2015-11-11 MED ORDER — BOOST / RESOURCE BREEZE PO LIQD: 1.0000 | Freq: Every day | ORAL | Status: DC

## 2015-11-11 NOTE — Consult Note (Signed)
[facility] CM Inpatient Consult   11/11/2015  [name] 26-Dec-1923 [id number]    Patient screened for [facility] Care Management services. Spoke with inpatient RNCM who states patient could benefit from [facility] Care Management due to frequent admissions. Went to bedside to offer and explain [facility] Care Management program with Ms. Feldkamp. She states "my daughter is Health Care Power of [employer] Friends, you will have to call her to discuss". Call made to [facility] Armstrong(daughter) at [phone] to explain and offer [facility] Care Management services. Mrs. Armstrong states "we have [facility], we do not need anyone else". She declined [facility] Care Management program. Made Ms. Armstrong aware that [facility] brochure and contact information will be left at bedside for her to call in future if she should change her mind. Made inpatient RNCM aware that patient declined [facility] Care Management program services.  Raiford Noble, MSN-Ed, RN,BSN [facility] Liaison [phone]

## 2015-11-11 NOTE — Care Management Note (Signed)
Case Management Note Marvetta Gibbons RN, BSN Unit 2W-Case Manager [phone]  Patient Details  Name: Shannon [name] MRN: [id number] Date of Birth: 1923/05/03  Subjective/Objective:   Pt admitted with bradycardia                 Action/Plan: PTA pt lived at home with daughter, anticipate return home- CM to follow  Expected Discharge Date:   11/11/15            Expected Discharge Plan:  [place]  In-House Referral:     Discharge planning Services  CM Consult  Post Acute Care Choice:  Home Health, Resumption of Svcs/PTA Provider Choice offered to:  Adult Children  DME Arranged:    DME Agency:     HH Arranged:  RN, Disease Management [place] Agency:  [place]  Status of Service:  Completed, signed off  If discussed at [place] of Stay Meetings, dates discussed:    Additional Comments:  11/11/15- 1600- Shannon Sinopoli RN, CM- pt for d/c home today with daughter- [facility] services to resume with [facility]- daughter would also like to add speech- orders have been placed- spoke with Adonis Brook with [facility] regarding resumption and d/c for today- per [facility] mtg pt has been made [place] due to frequent readmission. [facility] aware.   Dawayne Patricia, RN 11/11/2015, 4:11 PM

## 2015-11-11 NOTE — Progress Notes (Signed)
Patient discharged home with daughter. IV was dc'd and was intact. Medication was educated on and they stated that they understood the changes and discharge instructions.

## 2015-11-11 NOTE — Discharge Summary (Signed)
Physician Discharge Summary  Shannon [name] [id number] DOB: 12-17-1923 DOA: 11/06/2015  PCP: Shannon Cranker, DO  Admit date: 11/06/2015 Discharge date: 11/11/2015  Time spent: 65 minutes  Recommendations for Outpatient Follow-up:  1. Follow-up with cardiology in 1 week. 2. Follow-up with Shannon Cranker, DO in 1-2 weeks. On follow-up patient will need a basic metabolic profile done to follow-up on electrolytes and renal function. Patient has been started on erythromycin for probable gastroparesis and this need to be followed up upon. Patient will need repeat thyroid function studies done in about 4-6 weeks with further titration of Synthroid if deemed appropriate per PCP. 3. Patient was discharged home with home health RN and speech therapy.   Discharge Diagnoses:  Principal Problem:   Bradycardia Active Problems:   Essential hypertension, benign   A-fib (HCC)   Hypothyroidism   Anemia of chronic disease   CKD (chronic kidney disease) stage 3, GFR 30-59 ml/min   Sinus arrest   Weakness   Hypothyroidism due to medication   Discharge Condition: Stable and improved  Diet recommendation: Full liquid diet with nectar thick liquids/Tube feeds  Filed Weights   11/06/15 2258  Weight: 65.5 kg (144 lb 4.8 oz)    History of present illness:  Per Dr Shannon [name] is a 82 y.o. female with a past medical history significant for Afib on apixaban and amiodarone, remote CVA, HTN, SIADH, hypothyroidism, anemia, CKD, and gastric outlet obstruction with recent PEG tube who presented with weak spells and bradycardia, increasing in frequency.  History was collected from the patient and her daughter Shannon [name] by phone.  Patient reported that she has had a marked worsening in functional status since her SBO LOA last October.  Since then, she feels that she has had, every once in a while, a "spell" of weakness and sweats.  Now, in the last 2-3 days, they are accelerating and she has had one or more  each day.  The spells last a few minutes only, she starts to get very weak, then sweaty and soakingly diaphoretic, then to have discomfort "from my chest to my head" and to be so weak all over she can't move, and also to have right-sided weakness like her old stroke transiently during each episode.  On the day of admission,, her daughter described 2 such episodes. The first one she was getting dressed, had to sit down, had right leg weakness, sweating, and then resolved. The second occurred while she was eating her midday snack: she went to stand, but her daughter made her sit in her wheelchair because she appeared faint, then wheeled her to her bedroom on her walker where she flopped on the bed and appeared diaphoretic.  Both times, daughter reported that she took her pulse and it was low.  The second time she called EMS who found her HR 30s and hypotensive, and brought her to the ER. ED course: -Afebrile, heart rate 60, respirations 26, hypertensive, saturating well on room air -Na 135, K 5.2, Cr 1.66 (baseline 1.4-1.6), WBC 9.8 K, Hgb 8.6 (baseline 8-10) -Troponin and lactic acid normal -CT head negative -There was concern because of grimacing (which is actually her baseline) and her abdominal guarding (also baseline) that she had an acute abdomen, but CT abdomen and pelvis was negative for new pathology -ECG showed sinus rhythm, rate 60 without ischemia -While in the ER, the patient had 2-3 episodes of bradycardia to the 30s and 40s, associated with "feeling cold" again -Repeat ECG during one episode  Physician Discharge Summary  Shannon [name] [id number] DOB: 12-17-1923 DOA: 11/06/2015  PCP: Shannon Cranker, DO  Admit date: 11/06/2015 Discharge date: 11/11/2015  Time spent: 65 minutes  Recommendations for Outpatient Follow-up:  1. Follow-up with cardiology in 1 week. 2. Follow-up with Shannon Cranker, DO in 1-2 weeks. On follow-up patient will need a basic metabolic profile done to follow-up on electrolytes and renal function. Patient has been started on erythromycin for probable gastroparesis and this need to be followed up upon. Patient will need repeat thyroid function studies done in about 4-6 weeks with further titration of Synthroid if deemed appropriate per PCP. 3. Patient was discharged home with home health RN and speech therapy.   Discharge Diagnoses:  Principal Problem:   Bradycardia Active Problems:   Essential hypertension, benign   A-fib (HCC)   Hypothyroidism   Anemia of chronic disease   CKD (chronic kidney disease) stage 3, GFR 30-59 ml/min   Sinus arrest   Weakness   Hypothyroidism due to medication   Discharge Condition: Stable and improved  Diet recommendation: Full liquid diet with nectar thick liquids/Tube feeds  Filed Weights   11/06/15 2258  Weight: 65.5 kg (144 lb 4.8 oz)    History of present illness:  Per Dr Shannon [name] is a 82 y.o. female with a past medical history significant for Afib on apixaban and amiodarone, remote CVA, HTN, SIADH, hypothyroidism, anemia, CKD, and gastric outlet obstruction with recent PEG tube who presented with weak spells and bradycardia, increasing in frequency.  History was collected from the patient and her daughter Shannon [name] by phone.  Patient reported that she has had a marked worsening in functional status since her SBO LOA last October.  Since then, she feels that she has had, every once in a while, a "spell" of weakness and sweats.  Now, in the last 2-3 days, they are accelerating and she has had one or more  each day.  The spells last a few minutes only, she starts to get very weak, then sweaty and soakingly diaphoretic, then to have discomfort "from my chest to my head" and to be so weak all over she can't move, and also to have right-sided weakness like her old stroke transiently during each episode.  On the day of admission,, her daughter described 2 such episodes. The first one she was getting dressed, had to sit down, had right leg weakness, sweating, and then resolved. The second occurred while she was eating her midday snack: she went to stand, but her daughter made her sit in her wheelchair because she appeared faint, then wheeled her to her bedroom on her walker where she flopped on the bed and appeared diaphoretic.  Both times, daughter reported that she took her pulse and it was low.  The second time she called EMS who found her HR 30s and hypotensive, and brought her to the ER. ED course: -Afebrile, heart rate 60, respirations 26, hypertensive, saturating well on room air -Na 135, K 5.2, Cr 1.66 (baseline 1.4-1.6), WBC 9.8 K, Hgb 8.6 (baseline 8-10) -Troponin and lactic acid normal -CT head negative -There was concern because of grimacing (which is actually her baseline) and her abdominal guarding (also baseline) that she had an acute abdomen, but CT abdomen and pelvis was negative for new pathology -ECG showed sinus rhythm, rate 60 without ischemia -While in the ER, the patient had 2-3 episodes of bradycardia to the 30s and 40s, associated with "feeling cold" again -Repeat ECG during one episode  is generalized age related parenchymal atrophy with commensurate dilatation of the ventricles and sulci. Chronic small vessel ischemic changes again noted within the bilateral periventricular and subcortical white matter regions. There is no mass, hemorrhage, edema or other evidence of acute parenchymal abnormality. No extra-axial hemorrhage. Vascular: No hyperdense vessel or unexpected calcification. There are chronic calcified atherosclerotic changes of the large vessels at the skull base. Skull: Negative for fracture or focal lesion. Sinuses/Orbits: No acute findings. Other: None. IMPRESSION: 1. No acute intracranial abnormality. No intracranial mass, hemorrhage or edema. 2. Atrophy and chronic ischemic changes in the white matter. Electronically Signed   By: Franki Cabot M.D.   On: 11/06/2015 18:41   Dg Chest Port 1 View  Result Date: 11/08/2015 CLINICAL DATA:  New onset shortness of Breath EXAM: PORTABLE CHEST 1 VIEW COMPARISON:  10/27/2015 FINDINGS: Cardiac shadow is mildly enlarged but stable. Stable aortic calcifications are noted. Minimal scarring is again noted in the right lung base. Small pleural effusions are again seen bilaterally. IMPRESSION: Stable appearance of the chest. Electronically Signed   By: Inez Catalina M.D.   On: 11/08/2015 09:52   Dg Abd Portable 1v  Result Date: 11/06/2015 CLINICAL DATA:  Generalized abdominal pain for 2 days. EXAM: PORTABLE ABDOMEN  - 1 VIEW COMPARISON:  Abdomen x-ray dated 09/12/2015. FINDINGS: Single upright AP view centered at the level of the upper abdomen. Study limited by numerous overlying wires and pacer pads. Visualized bowel gas pattern is nonobstructive. No evidence of free intraperitoneal air seen. Probable small pleural effusions and/or atelectasis at each lung base. IMPRESSION: Limited exam due to numerous overlying, presumed extraneous, wires and pacer pads. Visualized bowel gas pattern is nonobstructive and there is no evidence of free intraperitoneal air seen. Probable small pleural effusions and/or atelectasis at each lung base. Electronically Signed   By: Franki Cabot M.D.   On: 11/06/2015 16:52   Dg Swallowing Func-speech Pathology  Result Date: 11/09/2015 Objective Swallowing Evaluation: Type of Study: MBS-Modified Barium Swallow Study Patient Details Name: Jaryiah Hennes MRN: [id number] Date of Birth: [birth year] Today's Date: 11/09/2015 Time: SLP Start Time (ACUTE ONLY): 1305-SLP Stop Time (ACUTE ONLY): 1330 SLP Time Calculation (min) (ACUTE ONLY): 25 min Past Medical History: Past Medical History: Diagnosis Date . Anemia   previous blood transfusions . Arthritis   "all over" . Asthma  . Bradycardia   topped BB and amiodarone decreased- junctional rhythm  . COPD (chronic obstructive pulmonary disease) ([place])  . Depression   "light case" . Gastric stenosis  . GERD (gastroesophageal reflux disease)  . History of stomach ulcers  . Hyperlipidemia  . Hypertension  . Hypothyroidism  . Paraesophageal hernia  . Perforated gastric ulcer ([place])  . Pneumonia  . Renal insufficiency  . SIADH (syndrome of inappropriate ADH production) ([place])   [name] 01/10/2015 . Small bowel obstruction ([place])   "I don't know how many" (01/11/2015) . Stroke ([place])   "light one" . Type II diabetes mellitus ([place])  . Ventral hernia with bowel obstruction  Past Surgical History: Past Surgical History: Procedure Laterality Date . CATARACT EXTRACTION W/ INTRAOCULAR  LENS  IMPLANT, BILATERAL   . CHOLECYSTECTOMY OPEN   . COLECTOMY   . ESOPHAGOGASTRODUODENOSCOPY N/A 01/19/2014  Procedure: ESOPHAGOGASTRODUODENOSCOPY (EGD);  Surgeon: Irene Shipper, MD;  Location: [facility];  Service: Endoscopy;  Laterality: N/A; . ESOPHAGOGASTRODUODENOSCOPY N/A 01/20/2014  Procedure: ESOPHAGOGASTRODUODENOSCOPY (EGD);  Surgeon: Irene Shipper, MD;  Location: [facility];  Service: Endoscopy;  Laterality: N/A; . ESOPHAGOGASTRODUODENOSCOPY N/A 03/19/2014  Procedure: ESOPHAGOGASTRODUODENOSCOPY (  is generalized age related parenchymal atrophy with commensurate dilatation of the ventricles and sulci. Chronic small vessel ischemic changes again noted within the bilateral periventricular and subcortical white matter regions. There is no mass, hemorrhage, edema or other evidence of acute parenchymal abnormality. No extra-axial hemorrhage. Vascular: No hyperdense vessel or unexpected calcification. There are chronic calcified atherosclerotic changes of the large vessels at the skull base. Skull: Negative for fracture or focal lesion. Sinuses/Orbits: No acute findings. Other: None. IMPRESSION: 1. No acute intracranial abnormality. No intracranial mass, hemorrhage or edema. 2. Atrophy and chronic ischemic changes in the white matter. Electronically Signed   By: Franki Cabot M.D.   On: 11/06/2015 18:41   Dg Chest Port 1 View  Result Date: 11/08/2015 CLINICAL DATA:  New onset shortness of Breath EXAM: PORTABLE CHEST 1 VIEW COMPARISON:  10/27/2015 FINDINGS: Cardiac shadow is mildly enlarged but stable. Stable aortic calcifications are noted. Minimal scarring is again noted in the right lung base. Small pleural effusions are again seen bilaterally. IMPRESSION: Stable appearance of the chest. Electronically Signed   By: Inez Catalina M.D.   On: 11/08/2015 09:52   Dg Abd Portable 1v  Result Date: 11/06/2015 CLINICAL DATA:  Generalized abdominal pain for 2 days. EXAM: PORTABLE ABDOMEN  - 1 VIEW COMPARISON:  Abdomen x-ray dated 09/12/2015. FINDINGS: Single upright AP view centered at the level of the upper abdomen. Study limited by numerous overlying wires and pacer pads. Visualized bowel gas pattern is nonobstructive. No evidence of free intraperitoneal air seen. Probable small pleural effusions and/or atelectasis at each lung base. IMPRESSION: Limited exam due to numerous overlying, presumed extraneous, wires and pacer pads. Visualized bowel gas pattern is nonobstructive and there is no evidence of free intraperitoneal air seen. Probable small pleural effusions and/or atelectasis at each lung base. Electronically Signed   By: Franki Cabot M.D.   On: 11/06/2015 16:52   Dg Swallowing Func-speech Pathology  Result Date: 11/09/2015 Objective Swallowing Evaluation: Type of Study: MBS-Modified Barium Swallow Study Patient Details Name: Jaryiah Hennes MRN: [id number] Date of Birth: [birth year] Today's Date: 11/09/2015 Time: SLP Start Time (ACUTE ONLY): 1305-SLP Stop Time (ACUTE ONLY): 1330 SLP Time Calculation (min) (ACUTE ONLY): 25 min Past Medical History: Past Medical History: Diagnosis Date . Anemia   previous blood transfusions . Arthritis   "all over" . Asthma  . Bradycardia   topped BB and amiodarone decreased- junctional rhythm  . COPD (chronic obstructive pulmonary disease) ([place])  . Depression   "light case" . Gastric stenosis  . GERD (gastroesophageal reflux disease)  . History of stomach ulcers  . Hyperlipidemia  . Hypertension  . Hypothyroidism  . Paraesophageal hernia  . Perforated gastric ulcer ([place])  . Pneumonia  . Renal insufficiency  . SIADH (syndrome of inappropriate ADH production) ([place])   [name] 01/10/2015 . Small bowel obstruction ([place])   "I don't know how many" (01/11/2015) . Stroke ([place])   "light one" . Type II diabetes mellitus ([place])  . Ventral hernia with bowel obstruction  Past Surgical History: Past Surgical History: Procedure Laterality Date . CATARACT EXTRACTION W/ INTRAOCULAR  LENS  IMPLANT, BILATERAL   . CHOLECYSTECTOMY OPEN   . COLECTOMY   . ESOPHAGOGASTRODUODENOSCOPY N/A 01/19/2014  Procedure: ESOPHAGOGASTRODUODENOSCOPY (EGD);  Surgeon: Irene Shipper, MD;  Location: [facility];  Service: Endoscopy;  Laterality: N/A; . ESOPHAGOGASTRODUODENOSCOPY N/A 01/20/2014  Procedure: ESOPHAGOGASTRODUODENOSCOPY (EGD);  Surgeon: Irene Shipper, MD;  Location: [facility];  Service: Endoscopy;  Laterality: N/A; . ESOPHAGOGASTRODUODENOSCOPY N/A 03/19/2014  Procedure: ESOPHAGOGASTRODUODENOSCOPY (  Physician Discharge Summary  Shannon [name] [id number] DOB: 12-17-1923 DOA: 11/06/2015  PCP: Shannon Cranker, DO  Admit date: 11/06/2015 Discharge date: 11/11/2015  Time spent: 65 minutes  Recommendations for Outpatient Follow-up:  1. Follow-up with cardiology in 1 week. 2. Follow-up with Shannon Cranker, DO in 1-2 weeks. On follow-up patient will need a basic metabolic profile done to follow-up on electrolytes and renal function. Patient has been started on erythromycin for probable gastroparesis and this need to be followed up upon. Patient will need repeat thyroid function studies done in about 4-6 weeks with further titration of Synthroid if deemed appropriate per PCP. 3. Patient was discharged home with home health RN and speech therapy.   Discharge Diagnoses:  Principal Problem:   Bradycardia Active Problems:   Essential hypertension, benign   A-fib (HCC)   Hypothyroidism   Anemia of chronic disease   CKD (chronic kidney disease) stage 3, GFR 30-59 ml/min   Sinus arrest   Weakness   Hypothyroidism due to medication   Discharge Condition: Stable and improved  Diet recommendation: Full liquid diet with nectar thick liquids/Tube feeds  Filed Weights   11/06/15 2258  Weight: 65.5 kg (144 lb 4.8 oz)    History of present illness:  Per Dr Shannon [name] is a 82 y.o. female with a past medical history significant for Afib on apixaban and amiodarone, remote CVA, HTN, SIADH, hypothyroidism, anemia, CKD, and gastric outlet obstruction with recent PEG tube who presented with weak spells and bradycardia, increasing in frequency.  History was collected from the patient and her daughter Shannon [name] by phone.  Patient reported that she has had a marked worsening in functional status since her SBO LOA last October.  Since then, she feels that she has had, every once in a while, a "spell" of weakness and sweats.  Now, in the last 2-3 days, they are accelerating and she has had one or more  each day.  The spells last a few minutes only, she starts to get very weak, then sweaty and soakingly diaphoretic, then to have discomfort "from my chest to my head" and to be so weak all over she can't move, and also to have right-sided weakness like her old stroke transiently during each episode.  On the day of admission,, her daughter described 2 such episodes. The first one she was getting dressed, had to sit down, had right leg weakness, sweating, and then resolved. The second occurred while she was eating her midday snack: she went to stand, but her daughter made her sit in her wheelchair because she appeared faint, then wheeled her to her bedroom on her walker where she flopped on the bed and appeared diaphoretic.  Both times, daughter reported that she took her pulse and it was low.  The second time she called EMS who found her HR 30s and hypotensive, and brought her to the ER. ED course: -Afebrile, heart rate 60, respirations 26, hypertensive, saturating well on room air -Na 135, K 5.2, Cr 1.66 (baseline 1.4-1.6), WBC 9.8 K, Hgb 8.6 (baseline 8-10) -Troponin and lactic acid normal -CT head negative -There was concern because of grimacing (which is actually her baseline) and her abdominal guarding (also baseline) that she had an acute abdomen, but CT abdomen and pelvis was negative for new pathology -ECG showed sinus rhythm, rate 60 without ischemia -While in the ER, the patient had 2-3 episodes of bradycardia to the 30s and 40s, associated with "feeling cold" again -Repeat ECG during one episode  Physician Discharge Summary  Shannon [name] [id number] DOB: 12-17-1923 DOA: 11/06/2015  PCP: Shannon Cranker, DO  Admit date: 11/06/2015 Discharge date: 11/11/2015  Time spent: 65 minutes  Recommendations for Outpatient Follow-up:  1. Follow-up with cardiology in 1 week. 2. Follow-up with Shannon Cranker, DO in 1-2 weeks. On follow-up patient will need a basic metabolic profile done to follow-up on electrolytes and renal function. Patient has been started on erythromycin for probable gastroparesis and this need to be followed up upon. Patient will need repeat thyroid function studies done in about 4-6 weeks with further titration of Synthroid if deemed appropriate per PCP. 3. Patient was discharged home with home health RN and speech therapy.   Discharge Diagnoses:  Principal Problem:   Bradycardia Active Problems:   Essential hypertension, benign   A-fib (HCC)   Hypothyroidism   Anemia of chronic disease   CKD (chronic kidney disease) stage 3, GFR 30-59 ml/min   Sinus arrest   Weakness   Hypothyroidism due to medication   Discharge Condition: Stable and improved  Diet recommendation: Full liquid diet with nectar thick liquids/Tube feeds  Filed Weights   11/06/15 2258  Weight: 65.5 kg (144 lb 4.8 oz)    History of present illness:  Per Dr Shannon [name] is a 82 y.o. female with a past medical history significant for Afib on apixaban and amiodarone, remote CVA, HTN, SIADH, hypothyroidism, anemia, CKD, and gastric outlet obstruction with recent PEG tube who presented with weak spells and bradycardia, increasing in frequency.  History was collected from the patient and her daughter Shannon [name] by phone.  Patient reported that she has had a marked worsening in functional status since her SBO LOA last October.  Since then, she feels that she has had, every once in a while, a "spell" of weakness and sweats.  Now, in the last 2-3 days, they are accelerating and she has had one or more  each day.  The spells last a few minutes only, she starts to get very weak, then sweaty and soakingly diaphoretic, then to have discomfort "from my chest to my head" and to be so weak all over she can't move, and also to have right-sided weakness like her old stroke transiently during each episode.  On the day of admission,, her daughter described 2 such episodes. The first one she was getting dressed, had to sit down, had right leg weakness, sweating, and then resolved. The second occurred while she was eating her midday snack: she went to stand, but her daughter made her sit in her wheelchair because she appeared faint, then wheeled her to her bedroom on her walker where she flopped on the bed and appeared diaphoretic.  Both times, daughter reported that she took her pulse and it was low.  The second time she called EMS who found her HR 30s and hypotensive, and brought her to the ER. ED course: -Afebrile, heart rate 60, respirations 26, hypertensive, saturating well on room air -Na 135, K 5.2, Cr 1.66 (baseline 1.4-1.6), WBC 9.8 K, Hgb 8.6 (baseline 8-10) -Troponin and lactic acid normal -CT head negative -There was concern because of grimacing (which is actually her baseline) and her abdominal guarding (also baseline) that she had an acute abdomen, but CT abdomen and pelvis was negative for new pathology -ECG showed sinus rhythm, rate 60 without ischemia -While in the ER, the patient had 2-3 episodes of bradycardia to the 30s and 40s, associated with "feeling cold" again -Repeat ECG during one episode  Physician Discharge Summary  Shannon [name] [id number] DOB: 12-17-1923 DOA: 11/06/2015  PCP: Shannon Cranker, DO  Admit date: 11/06/2015 Discharge date: 11/11/2015  Time spent: 65 minutes  Recommendations for Outpatient Follow-up:  1. Follow-up with cardiology in 1 week. 2. Follow-up with Shannon Cranker, DO in 1-2 weeks. On follow-up patient will need a basic metabolic profile done to follow-up on electrolytes and renal function. Patient has been started on erythromycin for probable gastroparesis and this need to be followed up upon. Patient will need repeat thyroid function studies done in about 4-6 weeks with further titration of Synthroid if deemed appropriate per PCP. 3. Patient was discharged home with home health RN and speech therapy.   Discharge Diagnoses:  Principal Problem:   Bradycardia Active Problems:   Essential hypertension, benign   A-fib (HCC)   Hypothyroidism   Anemia of chronic disease   CKD (chronic kidney disease) stage 3, GFR 30-59 ml/min   Sinus arrest   Weakness   Hypothyroidism due to medication   Discharge Condition: Stable and improved  Diet recommendation: Full liquid diet with nectar thick liquids/Tube feeds  Filed Weights   11/06/15 2258  Weight: 65.5 kg (144 lb 4.8 oz)    History of present illness:  Per Dr Shannon [name] is a 82 y.o. female with a past medical history significant for Afib on apixaban and amiodarone, remote CVA, HTN, SIADH, hypothyroidism, anemia, CKD, and gastric outlet obstruction with recent PEG tube who presented with weak spells and bradycardia, increasing in frequency.  History was collected from the patient and her daughter Shannon [name] by phone.  Patient reported that she has had a marked worsening in functional status since her SBO LOA last October.  Since then, she feels that she has had, every once in a while, a "spell" of weakness and sweats.  Now, in the last 2-3 days, they are accelerating and she has had one or more  each day.  The spells last a few minutes only, she starts to get very weak, then sweaty and soakingly diaphoretic, then to have discomfort "from my chest to my head" and to be so weak all over she can't move, and also to have right-sided weakness like her old stroke transiently during each episode.  On the day of admission,, her daughter described 2 such episodes. The first one she was getting dressed, had to sit down, had right leg weakness, sweating, and then resolved. The second occurred while she was eating her midday snack: she went to stand, but her daughter made her sit in her wheelchair because she appeared faint, then wheeled her to her bedroom on her walker where she flopped on the bed and appeared diaphoretic.  Both times, daughter reported that she took her pulse and it was low.  The second time she called EMS who found her HR 30s and hypotensive, and brought her to the ER. ED course: -Afebrile, heart rate 60, respirations 26, hypertensive, saturating well on room air -Na 135, K 5.2, Cr 1.66 (baseline 1.4-1.6), WBC 9.8 K, Hgb 8.6 (baseline 8-10) -Troponin and lactic acid normal -CT head negative -There was concern because of grimacing (which is actually her baseline) and her abdominal guarding (also baseline) that she had an acute abdomen, but CT abdomen and pelvis was negative for new pathology -ECG showed sinus rhythm, rate 60 without ischemia -While in the ER, the patient had 2-3 episodes of bradycardia to the 30s and 40s, associated with "feeling cold" again -Repeat ECG during one episode  Physician Discharge Summary  Shannon [name] [id number] DOB: 12-17-1923 DOA: 11/06/2015  PCP: Shannon Cranker, DO  Admit date: 11/06/2015 Discharge date: 11/11/2015  Time spent: 65 minutes  Recommendations for Outpatient Follow-up:  1. Follow-up with cardiology in 1 week. 2. Follow-up with Shannon Cranker, DO in 1-2 weeks. On follow-up patient will need a basic metabolic profile done to follow-up on electrolytes and renal function. Patient has been started on erythromycin for probable gastroparesis and this need to be followed up upon. Patient will need repeat thyroid function studies done in about 4-6 weeks with further titration of Synthroid if deemed appropriate per PCP. 3. Patient was discharged home with home health RN and speech therapy.   Discharge Diagnoses:  Principal Problem:   Bradycardia Active Problems:   Essential hypertension, benign   A-fib (HCC)   Hypothyroidism   Anemia of chronic disease   CKD (chronic kidney disease) stage 3, GFR 30-59 ml/min   Sinus arrest   Weakness   Hypothyroidism due to medication   Discharge Condition: Stable and improved  Diet recommendation: Full liquid diet with nectar thick liquids/Tube feeds  Filed Weights   11/06/15 2258  Weight: 65.5 kg (144 lb 4.8 oz)    History of present illness:  Per Dr Shannon [name] is a 82 y.o. female with a past medical history significant for Afib on apixaban and amiodarone, remote CVA, HTN, SIADH, hypothyroidism, anemia, CKD, and gastric outlet obstruction with recent PEG tube who presented with weak spells and bradycardia, increasing in frequency.  History was collected from the patient and her daughter Shannon [name] by phone.  Patient reported that she has had a marked worsening in functional status since her SBO LOA last October.  Since then, she feels that she has had, every once in a while, a "spell" of weakness and sweats.  Now, in the last 2-3 days, they are accelerating and she has had one or more  each day.  The spells last a few minutes only, she starts to get very weak, then sweaty and soakingly diaphoretic, then to have discomfort "from my chest to my head" and to be so weak all over she can't move, and also to have right-sided weakness like her old stroke transiently during each episode.  On the day of admission,, her daughter described 2 such episodes. The first one she was getting dressed, had to sit down, had right leg weakness, sweating, and then resolved. The second occurred while she was eating her midday snack: she went to stand, but her daughter made her sit in her wheelchair because she appeared faint, then wheeled her to her bedroom on her walker where she flopped on the bed and appeared diaphoretic.  Both times, daughter reported that she took her pulse and it was low.  The second time she called EMS who found her HR 30s and hypotensive, and brought her to the ER. ED course: -Afebrile, heart rate 60, respirations 26, hypertensive, saturating well on room air -Na 135, K 5.2, Cr 1.66 (baseline 1.4-1.6), WBC 9.8 K, Hgb 8.6 (baseline 8-10) -Troponin and lactic acid normal -CT head negative -There was concern because of grimacing (which is actually her baseline) and her abdominal guarding (also baseline) that she had an acute abdomen, but CT abdomen and pelvis was negative for new pathology -ECG showed sinus rhythm, rate 60 without ischemia -While in the ER, the patient had 2-3 episodes of bradycardia to the 30s and 40s, associated with "feeling cold" again -Repeat ECG during one episode  is generalized age related parenchymal atrophy with commensurate dilatation of the ventricles and sulci. Chronic small vessel ischemic changes again noted within the bilateral periventricular and subcortical white matter regions. There is no mass, hemorrhage, edema or other evidence of acute parenchymal abnormality. No extra-axial hemorrhage. Vascular: No hyperdense vessel or unexpected calcification. There are chronic calcified atherosclerotic changes of the large vessels at the skull base. Skull: Negative for fracture or focal lesion. Sinuses/Orbits: No acute findings. Other: None. IMPRESSION: 1. No acute intracranial abnormality. No intracranial mass, hemorrhage or edema. 2. Atrophy and chronic ischemic changes in the white matter. Electronically Signed   By: Franki Cabot M.D.   On: 11/06/2015 18:41   Dg Chest Port 1 View  Result Date: 11/08/2015 CLINICAL DATA:  New onset shortness of Breath EXAM: PORTABLE CHEST 1 VIEW COMPARISON:  10/27/2015 FINDINGS: Cardiac shadow is mildly enlarged but stable. Stable aortic calcifications are noted. Minimal scarring is again noted in the right lung base. Small pleural effusions are again seen bilaterally. IMPRESSION: Stable appearance of the chest. Electronically Signed   By: Inez Catalina M.D.   On: 11/08/2015 09:52   Dg Abd Portable 1v  Result Date: 11/06/2015 CLINICAL DATA:  Generalized abdominal pain for 2 days. EXAM: PORTABLE ABDOMEN  - 1 VIEW COMPARISON:  Abdomen x-ray dated 09/12/2015. FINDINGS: Single upright AP view centered at the level of the upper abdomen. Study limited by numerous overlying wires and pacer pads. Visualized bowel gas pattern is nonobstructive. No evidence of free intraperitoneal air seen. Probable small pleural effusions and/or atelectasis at each lung base. IMPRESSION: Limited exam due to numerous overlying, presumed extraneous, wires and pacer pads. Visualized bowel gas pattern is nonobstructive and there is no evidence of free intraperitoneal air seen. Probable small pleural effusions and/or atelectasis at each lung base. Electronically Signed   By: Franki Cabot M.D.   On: 11/06/2015 16:52   Dg Swallowing Func-speech Pathology  Result Date: 11/09/2015 Objective Swallowing Evaluation: Type of Study: MBS-Modified Barium Swallow Study Patient Details Name: Jaryiah Hennes MRN: [id number] Date of Birth: [birth year] Today's Date: 11/09/2015 Time: SLP Start Time (ACUTE ONLY): 1305-SLP Stop Time (ACUTE ONLY): 1330 SLP Time Calculation (min) (ACUTE ONLY): 25 min Past Medical History: Past Medical History: Diagnosis Date . Anemia   previous blood transfusions . Arthritis   "all over" . Asthma  . Bradycardia   topped BB and amiodarone decreased- junctional rhythm  . COPD (chronic obstructive pulmonary disease) ([place])  . Depression   "light case" . Gastric stenosis  . GERD (gastroesophageal reflux disease)  . History of stomach ulcers  . Hyperlipidemia  . Hypertension  . Hypothyroidism  . Paraesophageal hernia  . Perforated gastric ulcer ([place])  . Pneumonia  . Renal insufficiency  . SIADH (syndrome of inappropriate ADH production) ([place])   [name] 01/10/2015 . Small bowel obstruction ([place])   "I don't know how many" (01/11/2015) . Stroke ([place])   "light one" . Type II diabetes mellitus ([place])  . Ventral hernia with bowel obstruction  Past Surgical History: Past Surgical History: Procedure Laterality Date . CATARACT EXTRACTION W/ INTRAOCULAR  LENS  IMPLANT, BILATERAL   . CHOLECYSTECTOMY OPEN   . COLECTOMY   . ESOPHAGOGASTRODUODENOSCOPY N/A 01/19/2014  Procedure: ESOPHAGOGASTRODUODENOSCOPY (EGD);  Surgeon: Irene Shipper, MD;  Location: [facility];  Service: Endoscopy;  Laterality: N/A; . ESOPHAGOGASTRODUODENOSCOPY N/A 01/20/2014  Procedure: ESOPHAGOGASTRODUODENOSCOPY (EGD);  Surgeon: Irene Shipper, MD;  Location: [facility];  Service: Endoscopy;  Laterality: N/A; . ESOPHAGOGASTRODUODENOSCOPY N/A 03/19/2014  Procedure: ESOPHAGOGASTRODUODENOSCOPY (

## 2015-11-11 NOTE — Progress Notes (Signed)
Patient Profile: 82 year old female with a significant past medical history of paroxysmal atrial fibrillation/flutter on amiodarone, beta-blocker intolerance, non-obstructive coronary artery disease, type 2 diabetes, hypertension, cerebro-vascular disease, COPD, hypothyroidism, SIADH, and status post GJ tube is here for weakness. Cardiology was consulted for symptomatic bradycardia.  Subjective: The patient states that she feels significantly better today.  Objective: Vital signs in last 24 hours: Temp:  [97.8 F (36.6 C)-98.5 F (36.9 C)] 97.8 F (36.6 C) (08/10 0325) Pulse Rate:  [57-73] 68 (08/10 0854) Resp:  [17-18] 18 (08/10 0854) BP: (132-146)/(46-61) 140/55 (08/10 1241) SpO2:  [97 %-100 %] 100 % (08/10 0854) Last BM Date: 11/10/15  Intake/Output from previous day: 08/09 0701 - 08/10 0700 In: 760 [P.O.:600; NG/GT:60; IV Piggyback:100] Out: -  Intake/Output this shift: No intake/output data recorded.  Medications . amiodarone  100 mg Oral Daily  . amLODipine  10 mg Oral Daily  . apixaban  2.5 mg Oral BID  . arformoterol  15 mcg Nebulization BID  . atorvastatin  10 mg Oral q1800  . budesonide (PULMICORT) nebulizer solution  0.25 mg Nebulization BID  . cycloSPORINE  1 drop Both Eyes BID  . erythromycin  125 mg Intravenous Q6H  . feeding supplement  1 Container Oral Daily  . feeding supplement (OSMOLITE 1.5 CAL)  1,000 mL Per Tube Q2000  . feeding supplement (PRO-STAT SUGAR FREE 64)  30 mL Oral BID  . free water  60 mL Per Tube Q6H  . gabapentin  600 mg Oral BID   And  . gabapentin  300 mg Oral Q1200  . hydrALAZINE  50 mg Oral TID AC & HS  . ipratropium-albuterol  3 mL Nebulization BID  . isosorbide mononitrate  30 mg Oral Daily  . latanoprost  1 drop Both Eyes QHS  . levothyroxine  125 mcg Oral QAC breakfast  . nystatin   Topical Daily  . pantoprazole  80 mg Oral BID  . polyvinyl alcohol  1 drop Both Eyes QID  . predniSONE  40 mg Oral Q breakfast  .  sertraline  100 mg Oral Daily  . sodium chloride  1 g Oral TID WC    PE: General appearance: alert, cooperative and elderly and frail Neck: no carotid bruit and no JVD Lungs: CTA B/L Heart: regular rate and rhythm Extremities: no LEE Pulses: 2+ and symmetric Skin: warm and dry Neurologic: Grossly normal  Lab Results:   Recent Labs  11/09/15 0423 11/10/15 0334 11/11/15 0445  WBC 6.3 6.8 8.6  HGB 9.2* 9.0* 8.3*  HCT 27.7* 26.4* 24.7*  PLT 332 321 287   BMET  Recent Labs  11/09/15 0854 11/10/15 0334 11/11/15 0445  NA 133* 133* 133*  K 4.8 4.4 4.2  CL 103 103 104  CO2 20* 20* 20*  GLUCOSE 135* 154* 157*  BUN 35* 40* 39*  CREATININE 1.29* 1.34* 1.36*  CALCIUM 9.8 9.3 9.1     Assessment/Plan  Principal Problem:   Bradycardia Active Problems:   Essential hypertension, benign   A-fib (HCC)   Hypothyroidism   Anemia of chronic disease   CKD (chronic kidney disease) stage 3, GFR 30-59 ml/min   Sinus arrest   Weakness   Hypothyroidism due to medication  1. Brady-arrhythmia: Likely multifactorial (hypothyroidism and labetalol) use in the setting of a 35 year women. Patient with symptoms that are likely related to her bradycardia. She appears to be hemodynamically stable at this time.  - Would not recommend a pacemaker at  this time was noted on admit.  - Manage hypothyroidism accordingly (on levothyroxine that was increased to 125 mcg)  - labetalol discontinued, bradycardia and junctional rhythm have resolved. - HR currently in the 60s  2. Asthma exacerbation - resolved wheezing with prednisone, I would give her 1 more day of prednisone 40 mg po daily (for total of 3 days) - follow up with PCP  3. Hypertension: Patient with a known history of essential hypertension with blood pressure at goal based on age. She does have renal failure in the setting of hypertension and diabetes.  - No changes in home blood pressure medications except for stopping  labetalol, due to bradycardia.  4. Paroxysmal atrial fibrillation Patient with a known history of paroxysmal atrial fibrillation/flutter. ECG does not show atrial fibrillation at this time. She is currently on amiodarone for rhythm control and taking Apixaban for secondary prevention of stroke. - Continue apixaban and Amiodarone for now.   4. HYPOTHYROIDISM:  On levothyroxine. TSH is 51 however Free T3, T4 WNL. Levothyroxine recently increased. Now on 125 mcg daily. Plan is to reduce dose down to 112 mcg given age (was previously on 88 mcg daily before increase).   5. CODE Status:  Pt is partial DNR   She is stable for a discharge from cardiac standpoint, we will arrange for an outpatient follow up in 1 week.     LOS: 2 days  Ena Dawley 11/11/2015 12:49 PM

## 2015-11-11 NOTE — Progress Notes (Signed)
Physical Therapy Treatment Patient Details Name: Shannon [name] MRN: [id number] DOB: Feb 19, 1924 Today's Date: 2015-12-06    History of Present Illness Pt adm with bradycardia and weakness. PMH - Gtube due to gastic obstruction, HTN, afib, MI, CVA, DM, Copd, PVD    PT Comments    Pt continues to do well with mobility.  Follow Up Recommendations  No PT follow up     Equipment Recommendations  None recommended by PT    Recommendations for Other Services       Precautions / Restrictions Precautions Precautions: Fall;None Restrictions Weight Bearing Restrictions: No    Mobility  Bed Mobility                  Transfers Overall transfer level: Needs assistance Equipment used: 4-wheeled walker Transfers: Sit to/from Stand Sit to Stand: Min assist         General transfer comment: Assist to bring hips up  Ambulation/Gait Ambulation/Gait assistance: Supervision   Assistive device: 4-wheeled walker Gait Pattern/deviations: Step-through pattern;Decreased stride length   Gait velocity interpretation: at or above normal speed for age/gender General Gait Details: Pt with able to manuever rollator well. Dyspnea 2/4 with amb SpO2 100% on RA   Stairs            Wheelchair Mobility    Modified Rankin (Stroke Patients Only)       Balance Overall balance assessment: Needs assistance Sitting-balance support: No upper extremity supported Sitting balance-Leahy Scale: Normal     Standing balance support: No upper extremity supported Standing balance-Leahy Scale: Fair                      Cognition Arousal/Alertness: Awake/alert Behavior During Therapy: WFL for tasks assessed/performed Overall Cognitive Status: Within Functional Limits for tasks assessed                      Exercises      General Comments        Pertinent Vitals/Pain Pain Assessment: No/denies pain Faces Pain Scale: Hurts even more    Home Living                       Prior Function            PT Goals (current goals can now be found in the care plan section) Acute Rehab PT Goals Patient Stated Goal: return home Progress towards PT goals: Progressing toward goals    Frequency  Min 3X/week    PT Plan Current plan remains appropriate    Co-evaluation             End of Session Equipment Utilized During Treatment: Gait belt Activity Tolerance: Patient tolerated treatment well Patient left: in bed;with call bell/phone within reach     Time: 1041-1100 PT Time Calculation (min) (ACUTE ONLY): 19 min  Charges:  $Gait Training: 8-22 mins                    G Codes:      Shannon [name] 12-06-2015, 11:52 AM Suanne Marker PT [phone]

## 2015-11-15 ENCOUNTER — Telehealth: Payer: Self-pay | Admitting: *Deleted

## 2015-11-15 DIAGNOSIS — Z48815 Encounter for surgical aftercare following surgery on the digestive system: Secondary | ICD-10-CM | POA: Diagnosis not present

## 2015-11-15 DIAGNOSIS — Z431 Encounter for attention to gastrostomy: Secondary | ICD-10-CM | POA: Diagnosis not present

## 2015-11-15 DIAGNOSIS — J441 Chronic obstructive pulmonary disease with (acute) exacerbation: Secondary | ICD-10-CM

## 2015-11-15 NOTE — Telephone Encounter (Signed)
Patient caregiver, [name] called and stated that patient was just released from the hospital and they wrote a Rx for Nebulizer medications but they have no Nebulizer to use them. Would like a Rx to take to [facility] to get nebulizer machine. Please Advise.

## 2015-11-16 ENCOUNTER — Encounter: Payer: Self-pay | Admitting: Cardiology

## 2015-11-16 NOTE — Telephone Encounter (Signed)
Ok for [facility] for asthma and COPD to use as directed

## 2015-11-16 NOTE — Telephone Encounter (Signed)
Rx printed and faxed to [facility] fax: [phone] [place] Notified.

## 2015-11-18 ENCOUNTER — Encounter: Payer: Self-pay | Admitting: Cardiology

## 2015-11-18 ENCOUNTER — Ambulatory Visit (INDEPENDENT_AMBULATORY_CARE_PROVIDER_SITE_OTHER): Payer: Medicare Other | Admitting: Cardiology

## 2015-11-18 VITALS — BP 134/48 | HR 64 | Ht 66.0 in | Wt 138.0 lb

## 2015-11-18 DIAGNOSIS — R0602 Shortness of breath: Secondary | ICD-10-CM

## 2015-11-18 DIAGNOSIS — R001 Bradycardia, unspecified: Secondary | ICD-10-CM

## 2015-11-18 DIAGNOSIS — I2510 Atherosclerotic heart disease of native coronary artery without angina pectoris: Secondary | ICD-10-CM

## 2015-11-18 NOTE — Progress Notes (Signed)
11/18/2015 [name]   Dec 27, 1923  [id number]  Primary Physician Kirt Boys, DO Primary Cardiologist: Dr. Delton See   Reason for Visit/CC: [facility] F/u for Bradycardia   HPI:  Ms. Sato is a 82 year old female who presents to clinic today for post hospital follow-up. She has a history of paroxysmal atrial fibrillation on amiodarone and Eliquis  as well as a history of hypothyroidism, bradycardia, non-obstructive coronary artery disease, type 2 diabetes, hypertension, cerebro-vascular disease and COPD/ashtma.   She was recently admitted to [facility] for weakness and was found to be bradycardic with rates in the 40s. Her labetalol was discontinued. Her amiodarone was reduced down from 200 mg daily to 100 mg daily. She was monitored and heart rate improved as well as her weakness. She was also treated at that time by internal medicine for acute asthma exacerbation.  She presents back to clinic today for follow-up. She is here today with her son.  From a cardiac standpoint, she has done well. EKG today shows normal sinus rhythm with a heart rate of 64 bpm. Her weakness has resolved. Her main complaint now is a resting tremor. She denies chest pain. No lightheadedness dizziness, syncope/near-syncope. Her blood pressure is stable at 134/48. She reports full compliance with Eliquis. She denies any abnormal bleeding.   Current Outpatient Prescriptions  Medication Sig Dispense Refill  . acetaminophen (TYLENOL) 325 MG tablet Take 650 mg by mouth 2 (two) times daily.     [name] ADVAIR DISKUS 250-50 MCG/DOSE AEPB INHALE 1 PUFF INTO THE LUNGS 2 (TWO) TIMES DAILY AS NEEDED FOR SHORTNESS OF BREATH  3  . albuterol (PROVENTIL HFA;VENTOLIN HFA) 108 (90 Base) MCG/ACT inhaler Inhale 2 puffs into the lungs every 6 (six) hours as needed for wheezing or shortness of breath. 1 Inhaler 2  . AMINO ACIDS-PROTEIN HYDROLYS PO Take 30 mLs by mouth 2 (two) times daily.    [name] amiodarone (PACERONE) 100 MG tablet  Take 100 mg by mouth daily.    [name] amLODipine (NORVASC) 10 MG tablet Take 10 mg by mouth daily.    [name] apixaban (ELIQUIS) 2.5 MG TABS tablet Take 1 tablet (2.5 mg total) by mouth 2 (two) times daily. 180 tablet 1  . atorvastatin (LIPITOR) 10 MG tablet Take 10 mg by mouth daily.    [name] b complex vitamins tablet Take 1 tablet by mouth daily.     . bisacodyl (DULCOLAX) 10 MG suppository Place 1 suppository (10 mg total) rectally daily as needed for moderate constipation. 12 suppository 0  . calcium-vitamin D (OSCAL WITH D) 500-200 MG-UNIT per tablet Take 1 tablet by mouth daily with breakfast.     . chlorhexidine (PERIDEX) 0.12 % solution Use as directed 15 mLs in the mouth or throat 2 (two) times daily.    . [employer] GAS RELIEF 80 MG chewable tablet CHEW 1 TABLET BY MOUTH 4 TIMES A DAY AS NEEDED FOR FLATULENCE (Patient taking differently: CHEW 1 TABLET (80 mg) BY MOUTH 4 TIMES A DAY AS NEEDED FOR FLATULENCE) 30 tablet 0  . diclofenac sodium (VOLTAREN) 1 % GEL Apply 2 g topically daily as needed (for pain).     [name] erythromycin (E-MYCIN) 250 MG tablet Take 0.5 tablets (125 mg total) by mouth every 6 (six) hours. 120 tablet 3  . feeding supplement (BOOST / RESOURCE BREEZE) LIQD Take 1 Container by mouth daily.    . fluticasone (FLONASE) 50 MCG/ACT nasal spray Place 2 sprays into both nostrils daily. 16 g 3  .  gabapentin (NEURONTIN) 300 MG capsule Take 2 caps po in AM and qhs, take 1 cap daily at [facility] (Patient taking differently: Take 300-600 mg by mouth See admin instructions. Take 2 caps po in AM and qhs, take 1 cap daily at [facility]) 150 capsule 3  . hydrALAZINE (APRESOLINE) 50 MG tablet Take 1 tablet (50 mg total) by mouth 3 (three) times daily. 120 tablet 0  . ipratropium-albuterol (DUONEB) 0.5-2.5 (3) MG/3ML SOLN Take 3 mLs by nebulization every 6 (six) hours as needed (sob). Use 3 times daily x 4 days then every 6 hours as needed. 360 mL 4  . isosorbide mononitrate (IMDUR) 30 MG 24 hr tablet TAKE 1 TABLET (30 MG  TOTAL) BY MOUTH DAILY.  2  . latanoprost (XALATAN) 0.005 % ophthalmic solution Place 1 drop into both eyes at bedtime. 2.5 mL 12  . levothyroxine (SYNTHROID, LEVOTHROID) 112 MCG tablet Take 1 tablet (112 mcg total) by mouth daily before breakfast. 30 tablet 3  . Maltodextrin-Xanthan Gum (RESOURCE THICKENUP CLEAR) POWD Take 120 g by mouth as needed (use to thicken liquids.). 20 Can 0  . Nutritional Supplements (FEEDING SUPPLEMENT, OSMOLITE 1.5 CAL,) LIQD Place 1,000 mLs into feeding tube daily. 1000 mL 3  . nystatin (MYCOSTATIN/NYSTOP) powder Apply topically 2 (two) times daily. To rash (Patient taking differently: Apply topically daily. To rash) 15 g 0  . ondansetron (ZOFRAN) 4 MG tablet Take 1 tablet (4 mg total) by mouth every 8 (eight) hours as needed for nausea or vomiting. 20 tablet 0  . pantoprazole (PROTONIX) 40 MG tablet TAKE 1 TABLET BY MOUTH TWICE A DAY (Patient taking differently: TAKE 1 TABLET (40 mg)  BY MOUTH TWICE A DAY) 60 tablet 5  . Polyethyl Glycol-Propyl Glycol 0.4-0.3 % SOLN Place 1 drop into both eyes 4 (four) times daily.     . polyethylene glycol powder (GLYCOLAX/MIRALAX) powder MIX 17GRAMS IN 8 OUNCES OF LIQUID AND DRINK TWICE DAILY AS NEEDED (Patient taking differently: MIX 17GRAMS IN 8 OUNCES OF LIQUID AND DRINK TWICE DAILY AS NEEDED for constipation) 3162 g 1  . predniSONE (DELTASONE) 20 MG tablet Take 1-2 tablets (20-40 mg total) by mouth daily with breakfast. Take 2 tablets ( 40mg ) daily x 3 days, then 1 tablet (20mg ) daily x 3 days then stop. 9 tablet 0  . RESTASIS 0.05 % ophthalmic emulsion USE 1 DROP INTO BOTH EYES TWICE DAILY 60 mL 0  . sertraline (ZOLOFT) 100 MG tablet Take one tablet by mouth once daily for depression (Patient taking differently: Take 100 mg by mouth daily. ) 90 tablet 1  . sodium chloride 1 g tablet TAKE 1 TABLET (1 g) BY MOUTH THREE TIMES DAILY 90 tablet 2  . traMADol (ULTRAM) 50 MG tablet Take 50 mg by mouth daily as needed for moderate pain.       . Water For Irrigation, Sterile (FREE WATER) SOLN Place 60 mLs into feeding tube every 6 (six) hours.     No current facility-administered medications for this visit.     No Known Allergies  Social History   Social History  . Marital status: Widowed    Spouse name: N/A  . Number of children: 3  . Years of education: N/A   Occupational History  . Not on file.   Social History Main Topics  . Smoking status: Never Smoker  . Smokeless tobacco: Former Neurosurgeon    Types: Snuff  . Alcohol use No  . Drug use: No  . Sexual activity: No  Other Topics Concern  . Not on file   Social History Narrative   Married in [place], lives in one story house with 2 people and no pets   Occupation: ?   Has a living Will, [place], and doesn't have DNR   Was living with Husband (in frail health) in [place] until 09/2013.  After her [facility] admission they both came to live with daughter in [place]     Review of Systems: General: negative for chills, fever, night sweats or weight changes.  Cardiovascular: negative for chest pain, dyspnea on exertion, edema, orthopnea, palpitations, paroxysmal nocturnal dyspnea or shortness of breath Dermatological: negative for rash Respiratory: negative for cough or wheezing Urologic: negative for hematuria Abdominal: negative for nausea, vomiting, diarrhea, bright red blood per rectum, melena, or hematemesis Neurologic: negative for visual changes, syncope, or dizziness All other systems reviewed and are otherwise negative except as noted above.    Blood pressure (!) 134/48, pulse 64, height 5\' 6"  (1.676 m), weight 138 lb (62.6 kg).  General appearance: alert, cooperative, no distress and elderly and frail, ambulating with walker Neck: no carotid bruit and no JVD Lungs: clear to auscultation bilaterally Heart: regular rate and rhythm, S1, S2 normal, no murmur, click, rub or gallop Extremities: no LEE Pulses: 2+ and symmetric Skin: warm and dry Neurologic:  Grossly normal  EKG NSR 64 bpm   ASSESSMENT AND PLAN:   1. Bradycardia: Resolved.  EKG demonstrates sinus rhythm with a heart rate of 64 bpm. Her labetalol was discontinued during her recent hospitalization. Her amiodarone was reduced down from 200 mg to 100 g daily.  2. Paroxsymal atrial fibrillation:  Maintaining normal sinus rhythm on low-dose amiodarone, 100 mg daily. Heart rate is well controlled in the 60s. Continue Eliquis for anticoagulation+ amiodarone.   PLAN  F/u with Dr. Delton See in 3 months   Zade Falkner Delmer Islam 11/18/2015 9:17 AM

## 2015-11-18 NOTE — Patient Instructions (Addendum)
Medication Instructions: Your physician recommends that you continue on your current medications as directed. Please refer to the Current Medication list given to you today.   Labwork: None Ordered   Procedures/Testing: None Ordered   Follow-Up: Your physician recommends that you schedule a follow-up appointment in 3-4 Months with Dr. Meda Coffee  Please make sure you keep your follow up with Sherrie Mustache, NP on 11/25/15 @ 2:15  Any Additional Special Instructions Will Be Listed Below (If Applicable).     If you need a refill on your cardiac medications before your next appointment, please call your pharmacy.

## 2015-11-19 DIAGNOSIS — Z48815 Encounter for surgical aftercare following surgery on the digestive system: Secondary | ICD-10-CM | POA: Diagnosis not present

## 2015-11-19 DIAGNOSIS — Z431 Encounter for attention to gastrostomy: Secondary | ICD-10-CM | POA: Diagnosis not present

## 2015-11-24 ENCOUNTER — Other Ambulatory Visit: Payer: Self-pay | Admitting: Internal Medicine

## 2015-11-24 DIAGNOSIS — Z431 Encounter for attention to gastrostomy: Secondary | ICD-10-CM | POA: Diagnosis not present

## 2015-11-24 DIAGNOSIS — Z48815 Encounter for surgical aftercare following surgery on the digestive system: Secondary | ICD-10-CM | POA: Diagnosis not present

## 2015-11-25 ENCOUNTER — Ambulatory Visit (INDEPENDENT_AMBULATORY_CARE_PROVIDER_SITE_OTHER): Payer: Medicare Other | Admitting: Nurse Practitioner

## 2015-11-25 ENCOUNTER — Encounter: Payer: Self-pay | Admitting: Nurse Practitioner

## 2015-11-25 VITALS — BP 130/68 | HR 67 | Temp 97.8°F | Ht 66.0 in | Wt 140.4 lb

## 2015-11-25 DIAGNOSIS — N179 Acute kidney failure, unspecified: Secondary | ICD-10-CM

## 2015-11-25 DIAGNOSIS — R531 Weakness: Secondary | ICD-10-CM

## 2015-11-25 DIAGNOSIS — I480 Paroxysmal atrial fibrillation: Secondary | ICD-10-CM | POA: Diagnosis not present

## 2015-11-25 DIAGNOSIS — R0600 Dyspnea, unspecified: Secondary | ICD-10-CM

## 2015-11-25 DIAGNOSIS — E039 Hypothyroidism, unspecified: Secondary | ICD-10-CM | POA: Diagnosis not present

## 2015-11-25 DIAGNOSIS — N183 Chronic kidney disease, stage 3 unspecified: Secondary | ICD-10-CM

## 2015-11-25 DIAGNOSIS — E43 Unspecified severe protein-calorie malnutrition: Secondary | ICD-10-CM | POA: Diagnosis not present

## 2015-11-25 DIAGNOSIS — K3184 Gastroparesis: Secondary | ICD-10-CM

## 2015-11-25 DIAGNOSIS — R001 Bradycardia, unspecified: Secondary | ICD-10-CM

## 2015-11-25 LAB — BASIC METABOLIC PANEL WITH GFR
BUN: 45 mg/dL — ABNORMAL HIGH (ref 7–25)
CO2: 21 mmol/L (ref 20–31)
Calcium: 9.7 mg/dL (ref 8.6–10.4)
Chloride: 103 mmol/L (ref 98–110)
Creat: 1.59 mg/dL — ABNORMAL HIGH (ref 0.60–0.88)
GFR, Est African American: 33 mL/min — ABNORMAL LOW (ref >=60)
GFR, Est Non African American: 28 mL/min — ABNORMAL LOW (ref >=60)
Glucose, Bld: 114 mg/dL — ABNORMAL HIGH (ref 65–99)
Potassium: 4.8 mmol/L (ref 3.5–5.3)
Sodium: 135 mmol/L (ref 135–146)

## 2015-11-25 NOTE — Patient Instructions (Signed)
To get blood work today to follow up kidneys  Follow up in 4 weeks for blood work and office visit with Dr Eulas Post

## 2015-11-25 NOTE — Progress Notes (Signed)
Careteam: Patient Care Team: Gildardo Cranker, DO as PCP - General (Internal Medicine) Riccardo Dubin, MD as Referring Physician (Cardiology) Alveta Heimlich, MD as Referring Physician (Ophthalmology)  Advanced Directive information Type of Advance Directive: [place], Does patient want to make changes to advanced directive?: No - Patient declined  No Known Allergies  Chief Complaint  Patient presents with  . Follow-up    /Hospital follow up// c/o neck pain and stiffness x 14 days     HPI: Patient is a 82 y.o. female seen in the office today for hospital follow up. Pt with a past medical history significant for Afib on apixaban and amiodarone, remote CVA, HTN, SIADH, hypothyroidism, anemia, CKD, and gastric outlet obstruction with recent PEG tube who went to the hospital with weak spells and bradycardia (EMS found heart rate in the 77s) Pt lives with daughter who is here today. while in the ED There was concern because of grimacing (which is actually her baseline) and her abdominal guarding (also baseline) that she had an acute abdomen, but CT abdomen and pelvis was negative for new pathology -ECG showed sinus rhythm, rate 60 without ischemia -While in the ER, the patient had 2-3 episodes of bradycardia to the 30s and 40s, associated with "feeling cold" again -Repeat ECG during one episode shows some atrial beats and some junctional escape beats  Likely medication induced. Patient's labetalol was discontinued during the hospitalization.  Patient's TSH elevated at 51.089 with a free T4 of 0.73 and a T3 of 1.3. Patient's Synthroid was initially increased to 16mg daily which has subsequently been reduced to 125 MCG's daily per cardiology for the next 2 days and then decrease to 112 MCG's daily on day discharge. [facility]  [facility] was consulted due to patient symptomatic bradycardia. Patient was followed throughout the  hospitalization. Labetalol was discontinued. Pt has already followed up with cardiology since she has left the hospital. No further episodes of feeling weak, feels stronger since she has left the hospital.   Patient with history of atrial fibrillation. Continued on amiodarone for rhythm control. Patient remained rate controlled. Apixaban for anticoagulation.  Pt has also followed up with GI since hospitalization. Pt was dx  with   gastric outlet obstruction status post PEG placement however Dr KKieth Brightlywho stated she really did not have this.  Did not recommend continuing erythromycin or reglan.  Not currently having any more GI issues   Pt was noted to have COPD exacerbation and was treated with prednisone (which she has completed), duoneb (was using TID for 4 days) now as needed. Pt was continued on advair. Per hospital discharge reports she was discharged on brovana, dulera and pulmicort however not on current medication list.  Daughter and pt feel like breathing well controlled at this time. No increase in shortness of breath or chest congestion. Does not follow with pulmonary.    Review of Systems:  Review of Systems  Constitutional: Positive for appetite change and fatigue.  Respiratory: Negative for cough and shortness of breath.        Shortness of breath and cough have improved  Cardiovascular: Negative for chest pain, palpitations and leg swelling.  Gastrointestinal: Negative for abdominal distention, abdominal pain, blood in stool, constipation, diarrhea, nausea and vomiting.  Genitourinary: Negative for dysuria.  Musculoskeletal: Positive for neck pain and neck stiffness. Negative for arthralgias.  Neurological: Negative for dizziness and numbness.  Psychiatric/Behavioral: Negative for suicidal ideas.  All  other systems reviewed and are negative.   Past Medical History:  Diagnosis Date  . Anemia    previous blood transfusions  . Arthritis    "all over"  . Asthma   .  Bradycardia    topped BB and amiodarone decreased- junctional rhythm   . COPD (chronic obstructive pulmonary disease) ([place])   . Depression    "light case"  . Gastric stenosis   . GERD (gastroesophageal reflux disease)   . History of stomach ulcers   . Hyperlipidemia   . Hypertension   . Hypothyroidism   . Paraesophageal hernia   . Perforated gastric ulcer ([place])   . Pneumonia   . Renal insufficiency   . SIADH (syndrome of inappropriate ADH production) ([place])    [name] 01/10/2015  . Small bowel obstruction ([place])    "I don't know how many" (01/11/2015)  . Stroke ([place])    "light one"  . Type II diabetes mellitus ([place])   . Ventral hernia with bowel obstruction    Past Surgical History:  Procedure Laterality Date  . CATARACT EXTRACTION W/ INTRAOCULAR LENS  IMPLANT, BILATERAL    . CHOLECYSTECTOMY OPEN    . COLECTOMY    . ESOPHAGOGASTRODUODENOSCOPY N/A 01/19/2014   Procedure: ESOPHAGOGASTRODUODENOSCOPY (EGD);  Surgeon: Irene Shipper, MD;  Location: [facility];  Service: Endoscopy;  Laterality: N/A;  . ESOPHAGOGASTRODUODENOSCOPY N/A 01/20/2014   Procedure: ESOPHAGOGASTRODUODENOSCOPY (EGD);  Surgeon: Irene Shipper, MD;  Location: [facility];  Service: Endoscopy;  Laterality: N/A;  . ESOPHAGOGASTRODUODENOSCOPY N/A 03/19/2014   Procedure: ESOPHAGOGASTRODUODENOSCOPY (EGD);  Surgeon: Milus Banister, MD;  Location: [facility];  Service: Endoscopy;  Laterality: N/A;  . ESOPHAGOGASTRODUODENOSCOPY N/A 07/08/2015   Procedure: ESOPHAGOGASTRODUODENOSCOPY (EGD);  Surgeon: Doran Stabler, MD;  Location: [facility] ENDOSCOPY;  Service: Endoscopy;  Laterality: N/A;  . ESOPHAGOGASTRODUODENOSCOPY (EGD) WITH PROPOFOL N/A 09/15/2015   Procedure: ESOPHAGOGASTRODUODENOSCOPY (EGD) WITH PROPOFOL;  Surgeon: Manus Gunning, MD;  Location: [facility] ENDOSCOPY;  Service: Gastroenterology;  Laterality: N/A;  . GASTROJEJUNOSTOMY     hx/notes 01/10/2015  . GASTROJEJUNOSTOMY N/A 09/23/2015   Procedure: OPEN  GASTROJEJUNOSTOMY TUBE PLACEMENT;  Surgeon: Arta Bruce Kinsinger, MD;  Location: [facility] ORS;  Service: General;  Laterality: N/A;  . HERNIA REPAIR  2015  . LAPAROTOMY N/A 01/20/2015   Procedure: EXPLORATORY LAPAROTOMY;  Surgeon: Coralie Keens, MD;  Location: [place];  Service: General;  Laterality: N/A;  . LYSIS OF ADHESION N/A 01/20/2015   Procedure: LYSIS OF ADHESIONS < 1 HOUR;  Surgeon: Coralie Keens, MD;  Location: [place];  Service: General;  Laterality: N/A;  . TUBAL LIGATION    . VENTRAL HERNIA REPAIR  2015   incarcerated ventral hernia ([facility] 09/2013)/notes 01/10/2015   Social History:   reports that she has never smoked. She has quit using smokeless tobacco. Her smokeless tobacco use included Snuff. She reports that she does not drink alcohol or use drugs.  Family History  Problem Relation Age of Onset  . Stroke Mother   . Hypertension Mother   . Diabetes Brother   . Heart attack Neg Hx     Medications: Patient's Medications  New Prescriptions   No medications on file  Previous Medications   ACETAMINOPHEN (TYLENOL) 325 MG TABLET    Take 650 mg by mouth 2 (two) times daily.    ADVAIR DISKUS 250-50 MCG/DOSE AEPB    INHALE 1 PUFF INTO THE LUNGS 2 (TWO) TIMES DAILY AS NEEDED FOR SHORTNESS OF BREATH   ALBUTEROL (PROVENTIL HFA;VENTOLIN HFA) 108 (90  BASE) MCG/ACT INHALER    Inhale 2 puffs into the lungs every 6 (six) hours as needed for wheezing or shortness of breath.   AMINO ACIDS-PROTEIN HYDROLYS PO    Take 30 mLs by mouth 2 (two) times daily.   AMIODARONE (PACERONE) 100 MG TABLET    Take 100 mg by mouth daily.   AMLODIPINE (NORVASC) 10 MG TABLET    Take 10 mg by mouth daily.   APIXABAN (ELIQUIS) 2.5 MG TABS TABLET    Take 1 tablet (2.5 mg total) by mouth 2 (two) times daily.   ATORVASTATIN (LIPITOR) 10 MG TABLET    Take 10 mg by mouth daily.   B COMPLEX VITAMINS TABLET    Take 1 tablet by mouth daily.    BISACODYL (DULCOLAX) 10 MG SUPPOSITORY    Place 1 suppository (10 mg total)  rectally daily as needed for moderate constipation.   CALCIUM-VITAMIN D (OSCAL WITH D) 500-200 MG-UNIT PER TABLET    Take 1 tablet by mouth daily with breakfast.    CHLORHEXIDINE (PERIDEX) 0.12 % SOLUTION    Use as directed 15 mLs in the mouth or throat 2 (two) times daily.   DICLOFENAC SODIUM (VOLTAREN) 1 % GEL    Apply 2 g topically daily as needed (for pain).    ERYTHROMYCIN (E-MYCIN) 250 MG TABLET    Take 0.5 tablets (125 mg total) by mouth every 6 (six) hours.   FEEDING SUPPLEMENT (BOOST / RESOURCE BREEZE) LIQD    Take 1 Container by mouth daily.   FLUTICASONE (FLONASE) 50 MCG/ACT NASAL SPRAY    Place 2 sprays into both nostrils daily.   GABAPENTIN (NEURONTIN) 300 MG CAPSULE    Take 300 mg by mouth. 2 tab in the am, 1 tab at noon, 2 tabs at night.   HYDRALAZINE (APRESOLINE) 50 MG TABLET    Take 1 tablet (50 mg total) by mouth 3 (three) times daily.   IPRATROPIUM-ALBUTEROL (DUONEB) 0.5-2.5 (3) MG/3ML SOLN    Take 3 mLs by nebulization every 6 (six) hours as needed (sob). Use 3 times daily x 4 days then every 6 hours as needed.   ISOSORBIDE MONONITRATE (IMDUR) 30 MG 24 HR TABLET    TAKE 1 TABLET (30 MG TOTAL) BY MOUTH DAILY.   LATANOPROST (XALATAN) 0.005 % OPHTHALMIC SOLUTION    Place 1 drop into both eyes at bedtime.   LEVOTHYROXINE (SYNTHROID, LEVOTHROID) 112 MCG TABLET    Take 1 tablet (112 mcg total) by mouth daily before breakfast.   MALTODEXTRIN-XANTHAN GUM (RESOURCE THICKENUP CLEAR) POWD    Take 120 g by mouth as needed (use to thicken liquids.).   MULTIPLE VITAMINS-MINERALS (MULTIVITAMIN PO)    Take 1 tablet by mouth daily.   NUTRITIONAL SUPPLEMENTS (FEEDING SUPPLEMENT, OSMOLITE 1.5 CAL,) LIQD    Place 1,000 mLs into feeding tube daily.   NYSTATIN (NYSTATIN) POWDER    Apply topically daily.   ONDANSETRON (ZOFRAN) 4 MG TABLET    Take 1 tablet (4 mg total) by mouth every 8 (eight) hours as needed for nausea or vomiting.   PANTOPRAZOLE (PROTONIX) 40 MG TABLET    TAKE 1 TABLET BY MOUTH TWICE  A DAY   POLYETHYL GLYCOL-PROPYL GLYCOL 0.4-0.3 % SOLN    Place 1 drop into both eyes 4 (four) times daily.    POLYETHYLENE GLYCOL POWDER (GLYCOLAX/MIRALAX) POWDER    MIX 17GRAMS IN 8 OUNCES OF LIQUID AND DRINK TWICE DAILY AS NEEDED   RESTASIS 0.05 % OPHTHALMIC EMULSION    USE 1  DROP INTO BOTH EYES TWICE DAILY   SERTRALINE (ZOLOFT) 100 MG TABLET    Take one tablet by mouth once daily for depression   SIMETHICONE (MYLICON) 80 MG CHEWABLE TABLET    Chew 80 mg by mouth 4 (four) times daily as needed for flatulence.   SODIUM CHLORIDE 1 G TABLET    TAKE 1 TABLET (1 g) BY MOUTH THREE TIMES DAILY   TRAMADOL (ULTRAM) 50 MG TABLET    Take 50 mg by mouth daily as needed for moderate pain.    WATER FOR IRRIGATION, STERILE (FREE WATER) SOLN    Place 60 mLs into feeding tube every 6 (six) hours.  Modified Medications   No medications on file  Discontinued Medications   GABAPENTIN (NEURONTIN) 300 MG CAPSULE    Take 2 caps po in AM and qhs, take 1 cap daily at NOON   GABAPENTIN (NEURONTIN) 300 MG CAPSULE    TAKE 1 CAPSULE (300 MG TOTAL) BY MOUTH 3 (THREE) TIMES DAILY.   NYSTATIN (MYCOSTATIN/NYSTOP) POWDER    Apply topically 2 (two) times daily. To rash   PREDNISONE (DELTASONE) 20 MG TABLET    Take 1-2 tablets (20-40 mg total) by mouth daily with breakfast. Take 2 tablets ( 32m) daily x 3 days, then 1 tablet (248m daily x 3 days then stop.     Physical Exam:  Vitals:   11/25/15 1436  BP: 130/68  Pulse: 67  Temp: 97.8 F (36.6 C)  TempSrc: Oral  SpO2: 97%  Weight: 140 lb 6 oz (63.7 kg)  Height: '5\' 6"'  (1.676 m)   Body mass index is 22.66 kg/m.  Physical Exam  Constitutional: She is oriented to person, place, and time. She appears well-developed.  Frail appearing, sitting upright in chair, min conversational dyspnea. Daughter present  HENT:  Mouth/Throat: Oropharynx is clear and moist. No oropharyngeal exudate.  Eyes: Pupils are equal, round, and reactive to light. No scleral icterus.  Neck:  Neck supple. Carotid bruit is not present. No tracheal deviation present. No thyromegaly present.  Cardiovascular: Normal rate, regular rhythm and intact distal pulses.  Exam reveals no gallop and no friction rub.   Murmur (1/6 SEM) heard. Pulmonary/Chest: Effort normal. No stridor. No respiratory distress. She has no wheezes. She has no rales.  Abdominal: Soft. Bowel sounds are normal. She exhibits no distension and no mass. There is no hepatomegaly. There is no tenderness. There is no rebound and no guarding.  GJ tube intact with no signs of redness or d/c at insertion site; NT.  Musculoskeletal: She exhibits edema.  Lymphadenopathy:    She has no cervical adenopathy.  Neurological: She is alert and oriented to person, place, and time. Gait (uses rolling walker) abnormal.  Skin: Skin is warm and dry. No rash noted.  Psychiatric: She has a normal mood and affect. Her behavior is normal. Thought content normal.    Labs reviewed: Basic Metabolic Panel:  Recent Labs  01/22/15 0445 01/22/15 0831  09/10/15 1213  09/16/15 0322  10/06/15 0000 10/06/15 1151  11/08/15 1127 11/09/15 0854 11/10/15 0334 11/11/15 0445  NA 124* 133*  < > 145*  < > 137  < >  --  135  < >  --  133* 133* 133*  K 5.4* 4.1  < > 4.0  < > 3.5  < >  --  4.8  < >  --  4.8 4.4 4.2  CL 98* 106  < > 102  < > 110  < >  --  96  < >  --  103 103 104  CO2 20* 21*  < > 22  < > 22  < >  --  26  < >  --  20* 20* 20*  GLUCOSE 782* 158*  < > 111*  < > 83  < >  --  94  < >  --  135* 154* 157*  BUN 22* 23*  < > 17  < > 7  < >  --  25  < >  --  35* 40* 39*  CREATININE 1.12* 1.05*  < > 1.64*  < > 1.01*  < >  --  1.66*  < >  --  1.29* 1.34* 1.36*  CALCIUM 8.4* 8.7*  < > 9.8  < > 8.0*  < >  --  9.4  < >  --  9.8 9.3 9.1  MG 2.3 1.9  < >  --   < > 1.7  < >  --  1.8  --   --  2.0  --  1.8  PHOS 5.8* 3.1  --   --   --  1.8*  --   --   --   --   --   --   --   --   TSH  --   --   < > 58.260*  --   --   --  81.690*  --   --  51.089*  --    --   --   < > = values in this interval not displayed. Liver Function Tests:  Recent Labs  09/17/15 0415 10/06/15 1151 11/06/15 1612  AST '23 19 26  ' ALT '16 12 23  ' ALKPHOS 40 47 49  BILITOT 0.6 0.2 0.5  PROT 5.8* 7.1 7.6  ALBUMIN 2.5* 3.5 3.6    Recent Labs  08/26/15 0630 09/11/15 2351 11/06/15 1612  LIPASE 18 81* 21   No results for input(s): AMMONIA in the last 8760 hours. CBC:  Recent Labs  09/19/15 0418  10/06/15 1151 11/06/15 1612 11/09/15 0423 11/10/15 0334 11/11/15 0445  WBC 10.9*  < > 7.9 9.8 6.3 6.8 8.6  NEUTROABS 2.9  --  3.8 6.3  --   --   --   HGB 10.3*  < >  --  8.6* 9.2* 9.0* 8.3*  HCT 29.3*  < > 24.4* 26.2* 27.7* 26.4* 24.7*  MCV 93.6  < > 100* 94.6 93.9 95.3 95.7  PLT 327  < > 331 305 332 321 287  < > = values in this interval not displayed. Lipid Panel:  Recent Labs  01/20/15 0600 04/07/15 1015  CHOL  --  126  HDL  --  69  LDLCALC  --  36  TRIG 91 107  CHOLHDL  --  1.8   TSH:  Recent Labs  09/10/15 1213 10/06/15 0000 11/08/15 1127  TSH 58.260* 81.690* 51.089*   A1C: Lab Results  Component Value Date   HGBA1C 6.5 (H) 08/28/2015     Assessment/Plan 1. Hypothyroidism, unspecified hypothyroidism type TSH elevated in hospital, multiple adjustments made with synthroid. Current on synthroid 112 mcg daily. Will follow up TSH  - TSH; Future  2. Bradycardia Resolved, pt has already followed up with cardiologist. No recurrent bradycardic episodes. HR stable. labetalol was stopped and amiodarone was decrease to 100 mg daily   3. Paroxysmal atrial fibrillation (HCC) Stable, currently in NSR. conts on eliquis for anticoagulation and amiodarone  4. Gastroparesis Following with GI regarding  gastroparesis, PER daughter GI did not think erythromycin would be beneficial therefore she does not wish to cont giving medication. Will dc off medication list at this time. No current symptoms at this time  5. Acute renal failure superimposed on  stage 3 chronic kidney disease ([place]) -will follow up labs at this time - BMP with eGFR  6. Protein-calorie malnutrition, severe Weight stable, cont on supplements as well as full liquids with nectar thick per ST  7. Weakness -worsen weakness after hospitalization, working with home health therapy for strength training.   8. Dyspnea Improved. Per discharge summer pt was on Pulmicort, brovana, dulera and home regimen of Advair during hospitalization, unclear if pt were to cont this on discharge but did not receive RX for them. Pt was discharged on home regimen advair only and prednisone taper. Completed prednisone at this time and dypnea has imrpoved. Feels like COPD/asthma is at baseline. Will notify if worsening   To follow up in 1 month on dyspnea, gastroparesis, TSH and nutritional status with Dr Thayer Headings K. Harle Battiest  [facility] [phone] 8 am - 5 pm) [phone] (after hours)

## 2015-11-26 DIAGNOSIS — Z48815 Encounter for surgical aftercare following surgery on the digestive system: Secondary | ICD-10-CM | POA: Diagnosis not present

## 2015-11-26 DIAGNOSIS — Z431 Encounter for attention to gastrostomy: Secondary | ICD-10-CM | POA: Diagnosis not present

## 2015-11-28 ENCOUNTER — Other Ambulatory Visit: Payer: Self-pay | Admitting: Internal Medicine

## 2015-11-29 ENCOUNTER — Other Ambulatory Visit: Payer: Self-pay | Admitting: *Deleted

## 2015-11-29 DIAGNOSIS — I129 Hypertensive chronic kidney disease with stage 1 through stage 4 chronic kidney disease, or unspecified chronic kidney disease: Secondary | ICD-10-CM | POA: Diagnosis not present

## 2015-11-29 DIAGNOSIS — R001 Bradycardia, unspecified: Secondary | ICD-10-CM | POA: Diagnosis not present

## 2015-11-29 DIAGNOSIS — R531 Weakness: Secondary | ICD-10-CM | POA: Diagnosis not present

## 2015-11-29 DIAGNOSIS — D631 Anemia in chronic kidney disease: Secondary | ICD-10-CM | POA: Diagnosis not present

## 2015-11-29 DIAGNOSIS — I739 Peripheral vascular disease, unspecified: Secondary | ICD-10-CM | POA: Diagnosis not present

## 2015-11-29 DIAGNOSIS — N183 Chronic kidney disease, stage 3 (moderate): Secondary | ICD-10-CM | POA: Diagnosis not present

## 2015-11-29 DIAGNOSIS — K439 Ventral hernia without obstruction or gangrene: Secondary | ICD-10-CM | POA: Diagnosis not present

## 2015-11-29 DIAGNOSIS — E1122 Type 2 diabetes mellitus with diabetic chronic kidney disease: Secondary | ICD-10-CM | POA: Diagnosis not present

## 2015-11-29 MED ORDER — GABAPENTIN 300 MG PO CAPS: ORAL_CAPSULE | ORAL | 1 refills | Status: DC

## 2015-11-29 NOTE — Telephone Encounter (Signed)
Patient caregiver, [name] called and also, Kayla at [facility] in regards to patient's Gabapentin Rx. Stated that it is suppose to be 2 in the morning, 1 at noon and 2 in the evening. Rx sent to pharmacy and spoke with pharmacist.

## 2015-12-01 DIAGNOSIS — R531 Weakness: Secondary | ICD-10-CM | POA: Diagnosis not present

## 2015-12-01 DIAGNOSIS — R001 Bradycardia, unspecified: Secondary | ICD-10-CM | POA: Diagnosis not present

## 2015-12-02 DIAGNOSIS — R531 Weakness: Secondary | ICD-10-CM | POA: Diagnosis not present

## 2015-12-02 DIAGNOSIS — R001 Bradycardia, unspecified: Secondary | ICD-10-CM | POA: Diagnosis not present

## 2015-12-03 DIAGNOSIS — R001 Bradycardia, unspecified: Secondary | ICD-10-CM | POA: Diagnosis not present

## 2015-12-03 DIAGNOSIS — R531 Weakness: Secondary | ICD-10-CM | POA: Diagnosis not present

## 2015-12-07 ENCOUNTER — Telehealth: Payer: Self-pay | Admitting: *Deleted

## 2015-12-07 ENCOUNTER — Telehealth: Payer: Self-pay | Admitting: Cardiology

## 2015-12-07 DIAGNOSIS — R531 Weakness: Secondary | ICD-10-CM | POA: Diagnosis not present

## 2015-12-07 DIAGNOSIS — R001 Bradycardia, unspecified: Secondary | ICD-10-CM | POA: Diagnosis not present

## 2015-12-07 NOTE — Telephone Encounter (Signed)
Cindy with [facility] called and stated that patient has congestion in base of lungs, no fever, fluid build up, Increase in weight 142 from 136. She called the heart Dr. But they told her to contact PCP. She can get a home chest x-ray but needs an order from you. She doesn't think its bad enough to go to the ER. Please Advise.   Needs Rx faxed to [phone] with Diagnosis.

## 2015-12-07 NOTE — Telephone Encounter (Signed)
Faxed

## 2015-12-07 NOTE — Telephone Encounter (Signed)
Ok for CXR for rales; get BMP also

## 2015-12-07 NOTE — Telephone Encounter (Signed)
Cindy with [facility] is calling to get an order for Lasix from Dr Meda Coffee for this pt.  Per Jenny Reichmann, [facility] oversee's this pts Home Health Orders, but being she thinks the pt is fluid overloaded, she wanted to obtain an order from Dr Meda Coffee instead.  Per Jenny Reichmann, the pts weight is 142, and was 140 at her last OV with Sherrie Mustache NP with [facility].  Per Jenny Reichmann RN, she states that the pts VS are WNL and her SPO2 is 98 RA.  Jenny Reichmann states the pt is in no acute distress at this time. Jenny Reichmann states she hears rales in pts bilateral lower lobes of lungs.  Jenny Reichmann reports no worsening LEE in the pt.  Jenny Reichmann prefers obtaining an order for lasix, instead of the pt going to the hospital.  Maudry Mayhew that for the safety and continuity of care for this pt, she should call Dr Arletha Grippe office with [facility] for further orders.  Informed Jenny Reichmann that they recently saw the pt on 8/24, and did a BMET on the pt, which revealed elevated BUN and Creatinine.  Also informed Jenny Reichmann that the pt has poor renal function and also has a long-standing history of pneumonia.  Maudry Mayhew to call Dr [place] office to obtain appropriate orders for the pt, being they follow her home health orders, and have been closely following her. Maudry Mayhew that she could possible obtain a chest x ray order and an order to recheck labs in the home.  Per Jenny Reichmann, she verbalized understanding and agrees with this plan.  Jenny Reichmann was unaware that the pt was just seen in their clinic and labs were done, that revealed worsening renal function.  Per Jenny Reichmann RN, she will call their office now to obtain appropriate and further orders for the pt.  Jenny Reichmann gracious for all the assistance provided.  Will route this message to Dr Meda Coffee as an Juluis Rainier.

## 2015-12-07 NOTE — Telephone Encounter (Signed)
New message      At last visit (11-18-15) pt wt was 138 and today it is 142 with bp 142/70 HR 64.  Pt is slightly sob and oxygen level is 98.  Nurse says she is coughing and she hears rails in bases of her lungs.  Calling to get order for lasix if doctor says it is ok. Pt is not in any distress.  They are trying to keep her out of the hosp.  Please advise

## 2015-12-08 DIAGNOSIS — N183 Chronic kidney disease, stage 3 (moderate): Secondary | ICD-10-CM | POA: Diagnosis not present

## 2015-12-08 DIAGNOSIS — R001 Bradycardia, unspecified: Secondary | ICD-10-CM | POA: Diagnosis not present

## 2015-12-08 DIAGNOSIS — R531 Weakness: Secondary | ICD-10-CM | POA: Diagnosis not present

## 2015-12-08 DIAGNOSIS — I129 Hypertensive chronic kidney disease with stage 1 through stage 4 chronic kidney disease, or unspecified chronic kidney disease: Secondary | ICD-10-CM | POA: Diagnosis not present

## 2015-12-09 ENCOUNTER — Encounter: Payer: Self-pay | Admitting: Internal Medicine

## 2015-12-09 DIAGNOSIS — J811 Chronic pulmonary edema: Secondary | ICD-10-CM | POA: Diagnosis not present

## 2015-12-09 LAB — BASIC METABOLIC PANEL
BUN: 45 mg/dL — AB (ref 4–21)
Creatinine: 1.6 mg/dL — AB (ref 0.5–1.1)
Glucose: 88 mg/dL
Potassium: 5.4 mmol/L — AB (ref 3.4–5.3)
Sodium: 136 mmol/L — AB (ref 137–147)

## 2015-12-10 ENCOUNTER — Telehealth: Payer: Self-pay | Admitting: *Deleted

## 2015-12-10 DIAGNOSIS — R001 Bradycardia, unspecified: Secondary | ICD-10-CM | POA: Diagnosis not present

## 2015-12-10 DIAGNOSIS — R531 Weakness: Secondary | ICD-10-CM | POA: Diagnosis not present

## 2015-12-10 NOTE — Telephone Encounter (Signed)
Received BMP from [facility] and per Dr. Carmelina Peal Function reduced but stable. Potassium is up--Rx Kayexelate 60gm po x1 no RF. Recheck BMP on 12/13/15.  [name], caregiver notified and agreed. Called in Rx to pharmacist at [facility]. Jenny Reichmann, with [facility] notified and agreed. Will go out Monday to recheck.

## 2015-12-13 DIAGNOSIS — I129 Hypertensive chronic kidney disease with stage 1 through stage 4 chronic kidney disease, or unspecified chronic kidney disease: Secondary | ICD-10-CM | POA: Diagnosis not present

## 2015-12-13 DIAGNOSIS — R001 Bradycardia, unspecified: Secondary | ICD-10-CM | POA: Diagnosis not present

## 2015-12-13 DIAGNOSIS — R531 Weakness: Secondary | ICD-10-CM | POA: Diagnosis not present

## 2015-12-13 DIAGNOSIS — I480 Paroxysmal atrial fibrillation: Secondary | ICD-10-CM | POA: Diagnosis not present

## 2015-12-13 LAB — BASIC METABOLIC PANEL
BUN: 36 mg/dL — AB (ref 4–21)
Creatinine: 1.3 mg/dL — AB (ref 0.5–1.1)
Glucose: 83 mg/dL
Potassium: 5.1 mmol/L (ref 3.4–5.3)
Sodium: 138 mmol/L (ref 137–147)

## 2015-12-15 ENCOUNTER — Encounter: Payer: Self-pay | Admitting: *Deleted

## 2015-12-15 DIAGNOSIS — R001 Bradycardia, unspecified: Secondary | ICD-10-CM | POA: Diagnosis not present

## 2015-12-15 DIAGNOSIS — R531 Weakness: Secondary | ICD-10-CM | POA: Diagnosis not present

## 2015-12-16 DIAGNOSIS — R531 Weakness: Secondary | ICD-10-CM | POA: Diagnosis not present

## 2015-12-16 DIAGNOSIS — R001 Bradycardia, unspecified: Secondary | ICD-10-CM | POA: Diagnosis not present

## 2015-12-20 DIAGNOSIS — R531 Weakness: Secondary | ICD-10-CM | POA: Diagnosis not present

## 2015-12-20 DIAGNOSIS — R001 Bradycardia, unspecified: Secondary | ICD-10-CM | POA: Diagnosis not present

## 2015-12-22 ENCOUNTER — Telehealth: Payer: Self-pay | Admitting: *Deleted

## 2015-12-22 NOTE — Telephone Encounter (Signed)
Shannon [name], Caregiver called and stated that patient needed a prior authorization for her Pantoprazole 40mg . I called [phone] ID#: [id number] and spoke with Colletta [place] and medication was APPROVED.  [name] notified.

## 2015-12-24 ENCOUNTER — Other Ambulatory Visit: Payer: Medicare Other

## 2015-12-24 DIAGNOSIS — E039 Hypothyroidism, unspecified: Secondary | ICD-10-CM

## 2015-12-24 LAB — TSH: TSH: 42.44 mIU/L — ABNORMAL HIGH

## 2015-12-27 ENCOUNTER — Telehealth: Payer: Self-pay

## 2015-12-27 MED ORDER — LEVOTHYROXINE SODIUM 137 MCG PO TABS: 137.0000 ug | ORAL_TABLET | Freq: Every day | ORAL | 2 refills | Status: DC

## 2015-12-27 NOTE — Telephone Encounter (Signed)
-----   Message from [place], [place] sent at 12/26/2015  6:04 PM EDT ----- Thyroid slowly improving but still uncontrolled - increase levothyroxine 140mcg #30 take 1 tab po daily with 2 RF. Repeat TSH in 6 weeks.

## 2015-12-27 NOTE — Telephone Encounter (Signed)
Discussed results with patient's daughter, Mrs.Armstrong verbalized understanding of results.  Rx sent to pharmacy with increased dose of levothyroxine.   Copy of labs to e given to patient at pending appointment on Wednesday

## 2015-12-28 DIAGNOSIS — R531 Weakness: Secondary | ICD-10-CM | POA: Diagnosis not present

## 2015-12-28 DIAGNOSIS — R001 Bradycardia, unspecified: Secondary | ICD-10-CM | POA: Diagnosis not present

## 2015-12-29 ENCOUNTER — Encounter: Payer: Self-pay | Admitting: Internal Medicine

## 2015-12-29 ENCOUNTER — Ambulatory Visit (INDEPENDENT_AMBULATORY_CARE_PROVIDER_SITE_OTHER): Payer: Medicare Other | Admitting: Internal Medicine

## 2015-12-29 VITALS — BP 148/72 | HR 78 | Temp 97.5°F | Ht 66.0 in | Wt 151.8 lb

## 2015-12-29 DIAGNOSIS — I480 Paroxysmal atrial fibrillation: Secondary | ICD-10-CM

## 2015-12-29 DIAGNOSIS — R600 Localized edema: Secondary | ICD-10-CM

## 2015-12-29 DIAGNOSIS — I10 Essential (primary) hypertension: Secondary | ICD-10-CM | POA: Diagnosis not present

## 2015-12-29 DIAGNOSIS — N183 Chronic kidney disease, stage 3 unspecified: Secondary | ICD-10-CM

## 2015-12-29 DIAGNOSIS — K3184 Gastroparesis: Secondary | ICD-10-CM

## 2015-12-29 DIAGNOSIS — J45909 Unspecified asthma, uncomplicated: Secondary | ICD-10-CM | POA: Diagnosis not present

## 2015-12-29 DIAGNOSIS — D229 Melanocytic nevi, unspecified: Secondary | ICD-10-CM

## 2015-12-29 DIAGNOSIS — I2510 Atherosclerotic heart disease of native coronary artery without angina pectoris: Secondary | ICD-10-CM | POA: Diagnosis not present

## 2015-12-29 DIAGNOSIS — E43 Unspecified severe protein-calorie malnutrition: Secondary | ICD-10-CM

## 2015-12-29 DIAGNOSIS — E039 Hypothyroidism, unspecified: Secondary | ICD-10-CM | POA: Diagnosis not present

## 2015-12-29 DIAGNOSIS — Z23 Encounter for immunization: Secondary | ICD-10-CM | POA: Diagnosis not present

## 2015-12-29 NOTE — Progress Notes (Signed)
**Note Shannon-Identified via Obfuscation** Patient ID: Shannon [name], female   DOB: Apr 13, 1923, 82 y.o.   MRN: [id number]    Location:  [facility] Place of Service: OFFICE  Chief Complaint  Patient presents with  . Medical Management of Chronic Issues    4 week follow up    HPI:   82 yo female seen today for f/u dyspnea, gastroparesis, TSH and nutritional status. She has bumps that are itchy under her breasts. She no longer wears a bra. She gained 7 lbs but c/o LLE edema. She is tolerating TF  She has seasonal allergy sx's and takes zyrtec daily and has flonase. She takes advair for asthma  No seizures since last OV. Takes gabapentin  HTN/afib/hyperlipidemia - BP controlled on amlodipine, hydralazine, imdur. Cholesterol stable on lipitor. Rate controlled on amiodarone. She takes eliquis for anticoagulation. Followed by cardio  GOO - s/p GJ tube. Takes Protonix BID. Constipation stable on lactulose. She gets TF at night. Drinks liquids po. Takes zofran prn  Hyponatremia/ hx SIADH- she was taking salt tabs but over the last several weeks has noticed the entire tab in her stool intermittently. Na 138  DM - diet controlled. A1c 6.4%. Takes gabapentin for neuropathy  Hx CVA - stable. Takes ASA daily  Depression - mood stable on sertraline  Arthritis - joint pain stable on tramadol and voltaren gel  Hypothyroidism - takes levothyroxine 137 mcg daily. TSH 42.44  Pressure sore/right 3rd toe osteomyelitis - completed 1 month of augmentin. Followed by podiatry. She has PAD  COPD - takes advair 250/50, proventil hfa, duonebs. Followed by pulmonary. No recent exacerbations  Past Medical History:  Diagnosis Date  . Anemia    previous blood transfusions  . Arthritis    "all over"  . Asthma   . Bradycardia    topped BB and amiodarone decreased- junctional rhythm   . COPD (chronic obstructive pulmonary disease) (HCC)   . Depression    "light case"  . Gastric stenosis   . GERD (gastroesophageal reflux disease)   . History of  stomach ulcers   . Hyperlipidemia   . Hypertension   . Hypothyroidism   . Paraesophageal hernia   . Perforated gastric ulcer (HCC)   . Pneumonia   . Renal insufficiency   . SIADH (syndrome of inappropriate ADH production) (HCC)    Shannon [name] 01/10/2015  . Small bowel obstruction (HCC)    "I don't know how many" (01/11/2015)  . Stroke (HCC)    "light one"  . Type II diabetes mellitus (HCC)   . Ventral hernia with bowel obstruction     Past Surgical History:  Procedure Laterality Date  . CATARACT EXTRACTION W/ INTRAOCULAR LENS  IMPLANT, BILATERAL    . CHOLECYSTECTOMY OPEN    . COLECTOMY    . ESOPHAGOGASTRODUODENOSCOPY N/A 01/19/2014   Procedure: ESOPHAGOGASTRODUODENOSCOPY (EGD);  Surgeon: Shannon Fredrickson, MD;  Location: [facility];  Service: Endoscopy;  Laterality: N/A;  . ESOPHAGOGASTRODUODENOSCOPY N/A 01/20/2014   Procedure: ESOPHAGOGASTRODUODENOSCOPY (EGD);  Surgeon: Shannon Fredrickson, MD;  Location: [facility];  Service: Endoscopy;  Laterality: N/A;  . ESOPHAGOGASTRODUODENOSCOPY N/A 03/19/2014   Procedure: ESOPHAGOGASTRODUODENOSCOPY (EGD);  Surgeon: Shannon Fee, MD;  Location: [facility];  Service: Endoscopy;  Laterality: N/A;  . ESOPHAGOGASTRODUODENOSCOPY N/A 07/08/2015   Procedure: ESOPHAGOGASTRODUODENOSCOPY (EGD);  Surgeon: Shannon Rist, MD;  Location: [facility] ENDOSCOPY;  Service: Endoscopy;  Laterality: N/A;  . ESOPHAGOGASTRODUODENOSCOPY (EGD) WITH PROPOFOL N/A 09/15/2015   Procedure: ESOPHAGOGASTRODUODENOSCOPY (EGD) WITH PROPOFOL;  Surgeon: Shannon Frederick, MD;  Location:  [facility] ENDOSCOPY;  Service: Gastroenterology;  Laterality: N/A;  . GASTROJEJUNOSTOMY     hx/notes 01/10/2015  . GASTROJEJUNOSTOMY N/A 09/23/2015   Procedure: OPEN GASTROJEJUNOSTOMY TUBE PLACEMENT;  Surgeon: Shannon Blanch Kinsinger, MD;  Location: [facility] ORS;  Service: General;  Laterality: N/A;  . HERNIA REPAIR  2015  . LAPAROTOMY N/A 01/20/2015   Procedure: EXPLORATORY LAPAROTOMY;  Surgeon: Shannon Miyamoto, MD;   Location: [facility] OR;  Service: General;  Laterality: N/A;  . LYSIS OF ADHESION N/A 01/20/2015   Procedure: LYSIS OF ADHESIONS < 1 HOUR;  Surgeon: Shannon Miyamoto, MD;  Location: [facility] OR;  Service: General;  Laterality: N/A;  . TUBAL LIGATION    . VENTRAL HERNIA REPAIR  2015   incarcerated ventral hernia ([facility] 09/2013)/notes 01/10/2015    Patient Care Team: Shannon Boys, DO as PCP - General (Internal Medicine) Shannon Coop, MD as Referring Physician (Cardiology) Shannon Binning, MD as Referring Physician (Ophthalmology)  Social History   Social History  . Marital status: Widowed    Spouse name: N/A  . Number of children: 3  . Years of education: N/A   Occupational History  . Not on file.   Social History Main Topics  . Smoking status: Never Smoker  . Smokeless tobacco: Former Neurosurgeon    Types: Snuff  . Alcohol use No  . Drug use: No  . Sexual activity: No   Other Topics Concern  . Not on file   Social History Narrative   Married in [place], lives in one story house with 2 people and no pets   Occupation: ?   Has a living Will, [place], and doesn't have DNR   Was living with Husband (in frail health) in [place] until 09/2013.  After her [facility] admission they both came to live with daughter in [place]     reports that she has never smoked. She has quit using smokeless tobacco. Her smokeless tobacco use included Snuff. She reports that she does not drink alcohol or use drugs.  Family History  Problem Relation Age of Onset  . Stroke Mother   . Hypertension Mother   . Diabetes Brother   . Heart attack Neg Hx    Family Status  Relation Status  . Mother Deceased  . Father Deceased  . Maternal Grandmother Deceased  . Maternal Grandfather Deceased  . Paternal Grandmother Deceased  . Paternal Grandfather Deceased  . Brother   . Neg Hx      No Known Allergies  Medications: Patient's Medications  New Prescriptions   No medications on file  Previous  Medications   ACETAMINOPHEN (TYLENOL) 325 MG TABLET    Take 650 mg by mouth 2 (two) times daily.    ADVAIR DISKUS 250-50 MCG/DOSE AEPB    INHALE 1 PUFF INTO THE LUNGS 2 (TWO) TIMES DAILY AS NEEDED FOR SHORTNESS OF BREATH   ALBUTEROL (PROVENTIL HFA;VENTOLIN HFA) 108 (90 BASE) MCG/ACT INHALER    Inhale 2 puffs into the lungs every 6 (six) hours as needed for wheezing or shortness of breath.   AMINO ACIDS-PROTEIN HYDROLYS PO    Take 30 mLs by mouth 2 (two) times daily.   AMIODARONE (PACERONE) 100 MG TABLET    Take 100 mg by mouth daily.   AMLODIPINE (NORVASC) 10 MG TABLET    TAKE 1 TABLET BY MOUTH DAILY FOR HYPERTENSION   APIXABAN (ELIQUIS) 2.5 MG TABS TABLET    Take 1 tablet (2.5 mg total) by mouth 2 (two) times daily.   ATORVASTATIN (LIPITOR)  10 MG TABLET    Take 10 mg by mouth daily.   B COMPLEX VITAMINS TABLET    Take 1 tablet by mouth daily.    BISACODYL (DULCOLAX) 10 MG SUPPOSITORY    Place 1 suppository (10 mg total) rectally daily as needed for moderate constipation.   CALCIUM-VITAMIN D (OSCAL WITH D) 500-200 MG-UNIT PER TABLET    Take 1 tablet by mouth daily with breakfast.    CHLORHEXIDINE (PERIDEX) 0.12 % SOLUTION    Use as directed 15 mLs in the mouth or throat 2 (two) times daily.   DICLOFENAC SODIUM (VOLTAREN) 1 % GEL    Apply 2 g topically daily as needed (for pain).    FEEDING SUPPLEMENT (BOOST / RESOURCE BREEZE) LIQD    Take 1 Container by mouth daily.   FLUTICASONE (FLONASE) 50 MCG/ACT NASAL SPRAY    Place 2 sprays into both nostrils daily.   GABAPENTIN (NEURONTIN) 300 MG CAPSULE    Take 2 tablets in the morning, Take 1 tablet at noon, Take 2 tablets at night for pain   HYDRALAZINE (APRESOLINE) 50 MG TABLET    Take 1 tablet (50 mg total) by mouth 3 (three) times daily.   IPRATROPIUM-ALBUTEROL (DUONEB) 0.5-2.5 (3) MG/3ML SOLN    Take 3 mLs by nebulization every 6 (six) hours as needed (sob). Use 3 times daily x 4 days then every 6 hours as needed.   ISOSORBIDE MONONITRATE (IMDUR) 30  MG 24 HR TABLET    TAKE 1 TABLET (30 MG TOTAL) BY MOUTH DAILY.   LATANOPROST (XALATAN) 0.005 % OPHTHALMIC SOLUTION    Place 1 drop into both eyes at bedtime.   LEVOTHYROXINE (SYNTHROID, LEVOTHROID) 137 MCG TABLET    Take 1 tablet (137 mcg total) by mouth daily before breakfast.   MALTODEXTRIN-XANTHAN GUM (RESOURCE THICKENUP CLEAR) POWD    Take 120 g by mouth as needed (use to thicken liquids.).   MULTIPLE VITAMINS-MINERALS (MULTIVITAMIN PO)    Take 1 tablet by mouth daily.   NUTRITIONAL SUPPLEMENTS (FEEDING SUPPLEMENT, OSMOLITE 1.5 CAL,) LIQD    Place 1,000 mLs into feeding tube daily.   NYSTATIN (NYSTATIN) POWDER    Apply topically daily.   ONDANSETRON (ZOFRAN) 4 MG TABLET    Take 1 tablet (4 mg total) by mouth every 8 (eight) hours as needed for nausea or vomiting.   PANTOPRAZOLE (PROTONIX) 40 MG TABLET    TAKE 1 TABLET BY MOUTH TWICE A DAY   POLYETHYL GLYCOL-PROPYL GLYCOL 0.4-0.3 % SOLN    Place 1 drop into both eyes 4 (four) times daily.    POLYETHYLENE GLYCOL POWDER (GLYCOLAX/MIRALAX) POWDER    MIX 17GRAMS IN 8 OUNCES OF LIQUID AND DRINK TWICE DAILY AS NEEDED   RESTASIS 0.05 % OPHTHALMIC EMULSION    USE 1 DROP INTO BOTH EYES TWICE DAILY   SERTRALINE (ZOLOFT) 100 MG TABLET    Take one tablet by mouth once daily for depression   SIMETHICONE (MYLICON) 80 MG CHEWABLE TABLET    Chew 80 mg by mouth 4 (four) times daily as needed for flatulence.   SODIUM CHLORIDE 1 G TABLET    TAKE 1 TABLET (1 g) BY MOUTH THREE TIMES DAILY   TRAMADOL (ULTRAM) 50 MG TABLET    Take 50 mg by mouth daily as needed for moderate pain.    WATER FOR IRRIGATION, STERILE (FREE WATER) SOLN    Place 60 mLs into feeding tube every 6 (six) hours.  Modified Medications   No medications on file  Discontinued  Medications   No medications on file    Review of Systems  Vitals:   12/29/15 1348  BP: (!) 148/72  Pulse: 78  Temp: 97.5 F (36.4 C)  TempSrc: Oral  SpO2: 98%  Weight: 151 lb 12.8 oz (68.9 kg)  Height: 5\' 6"   (1.676 m)   Body mass index is 24.5 kg/m.  Physical Exam  Constitutional: She is oriented to person, place, and time. She appears well-developed.  Frail appearing, sitting upright in chair, min conversational dyspnea. Daughter present  HENT:  Mouth/Throat: Oropharynx is clear and moist. No oropharyngeal exudate.  MMM  Eyes: Pupils are equal, round, and reactive to light. No scleral icterus.  Neck: Neck supple. Carotid bruit is not present. No tracheal deviation present. No thyromegaly present.  Cardiovascular: Normal rate, regular rhythm and intact distal pulses.  Exam reveals no gallop and no friction rub.   Murmur (1/6 SEM) heard. +2 pitting LLE edema; +1 pitting RLE. No calf TTP; loss of hair on leg b/l due to swelling. Left calf varicose veins, soft, NT  Pulmonary/Chest: Effort normal. No stridor. No respiratory distress. She has no wheezes. She has no rales.  Abdominal: Soft. Bowel sounds are normal. She exhibits no distension and no mass. There is no hepatomegaly. There is tenderness (along midline surgical scar but no wound dehiscence or d/c). There is no rebound and no guarding.  GJ tube intact with no signs of redness or d/c at insertion site; NT.  Musculoskeletal: She exhibits edema.  Lymphadenopathy:    She has no cervical adenopathy.  Neurological: She is alert and oriented to person, place, and time. Gait (uses rolling walker) abnormal.  Skin: Skin is warm and dry. No rash noted.  Multiple nevi under L>R breast but no redness or d/c. No palpable lumps. No dysplastic appearing nevi  Psychiatric: She has a normal mood and affect. Her behavior is normal. Thought content normal.     Labs reviewed: Appointment on 12/24/2015  Component Date Value Ref Range Status  . TSH 12/24/2015 42.44* mIU/L Final   Comment:   Reference Range   > or = 20 Years  0.40-4.50   Pregnancy Range First trimester  0.26-2.66 Second trimester 0.55-2.73 Third trimester  0.43-2.91     Abstract  on 12/15/2015  Component Date Value Ref Range Status  . Glucose 12/13/2015 83  mg/dL Final  . BUN 78/29/5621 36* 4 - 21 mg/dL Final  . Creatinine 30/86/5784 1.3* 0.5 - 1.1 mg/dL Final  . Potassium 69/62/9528 5.1  3.4 - 5.3 mmol/L Final  . Sodium 12/13/2015 138  137 - 147 mmol/L Final  Abstract on 12/09/2015  Component Date Value Ref Range Status  . Glucose 12/09/2015 88  mg/dL Final  . BUN 41/32/4401 45* 4 - 21 mg/dL Final  . Creatinine 02/72/5366 1.6* 0.5 - 1.1 mg/dL Final  . Potassium 44/06/4740 5.4* 3.4 - 5.3 mmol/L Final  . Sodium 12/09/2015 136* 137 - 147 mmol/L Final  Office Visit on 11/25/2015  Component Date Value Ref Range Status  . Sodium 11/25/2015 135  135 - 146 mmol/L Final  . Potassium 11/25/2015 4.8  3.5 - 5.3 mmol/L Final  . Chloride 11/25/2015 103  98 - 110 mmol/L Final  . CO2 11/25/2015 21  20 - 31 mmol/L Final  . Glucose, Bld 11/25/2015 114* 65 - 99 mg/dL Final  . BUN 59/56/3875 45* 7 - 25 mg/dL Final  . Creat 64/33/2951 1.59* 0.60 - 0.88 mg/dL Final   Comment:   For patients >  or = 80 years of age: The upper reference limit for Creatinine is approximately 13% higher for people identified as African-American.     . Calcium 11/25/2015 9.7  8.6 - 10.4 mg/dL Final  . GFR, Est African American 11/25/2015 33* >=60 mL/min Final  . GFR, Est Non African American 11/25/2015 28* >=60 mL/min Final  Admission on 11/06/2015, Discharged on 11/11/2015  Component Date Value Ref Range Status  . Lipase 11/06/2015 21  11 - 51 U/L Final  . Lactic Acid, Venous 11/06/2015 1.2  0.5 - 1.9 mmol/L Final  . Lactic Acid, Venous 11/06/2015 0.9  0.5 - 1.9 mmol/L Final  . Color, Urine 11/06/2015 YELLOW  YELLOW Final  . APPearance 11/06/2015 CLEAR  CLEAR Final  . Specific Gravity, Urine 11/06/2015 1.012  1.005 - 1.030 Final  . pH 11/06/2015 8.0  5.0 - 8.0 Final  . Glucose, UA 11/06/2015 NEGATIVE  NEGATIVE mg/dL Final  . Hgb urine dipstick 11/06/2015 NEGATIVE  NEGATIVE Final  . Bilirubin  Urine 11/06/2015 NEGATIVE  NEGATIVE Final  . Ketones, ur 11/06/2015 NEGATIVE  NEGATIVE mg/dL Final  . Protein, ur 95/62/1308 NEGATIVE  NEGATIVE mg/dL Final  . Nitrite 65/78/4696 NEGATIVE  NEGATIVE Final  . Leukocytes, UA 11/06/2015 NEGATIVE  NEGATIVE Final  . Troponin i, poc 11/06/2015 0.01  0.00 - 0.08 ng/mL Final  . Comment 3 11/06/2015          Final   Comment: Due to the release kinetics of cTnI, a negative result within the first hours of the onset of symptoms does not rule out myocardial infarction with certainty. If myocardial infarction is still suspected, repeat the test at appropriate intervals.   [name] pH, Ven 11/06/2015 PENDING  7.250 - 7.300 Incomplete  . pCO2, Ven 11/06/2015 PENDING  45.0 - 50.0 mmHg Incomplete  . pO2, Ven 11/06/2015 PENDING  31.0 - 45.0 mmHg Incomplete  . Sodium 11/06/2015 135  135 - 145 mmol/L Final  . Potassium 11/06/2015 5.2* 3.5 - 5.1 mmol/L Final  . Chloride 11/06/2015 103  101 - 111 mmol/L Final  . CO2 11/06/2015 23  22 - 32 mmol/L Final  . Glucose, Bld 11/06/2015 100* 65 - 99 mg/dL Final  . BUN 29/52/8413 50* 6 - 20 mg/dL Final  . Creatinine, Ser 11/06/2015 1.66* 0.44 - 1.00 mg/dL Final  . Calcium 24/40/1027 9.8  8.9 - 10.3 mg/dL Final  . Total Protein 11/06/2015 7.6  6.5 - 8.1 g/dL Final  . Albumin 25/36/6440 3.6  3.5 - 5.0 g/dL Final  . AST 34/74/2595 26  15 - 41 U/L Final  . ALT 11/06/2015 23  14 - 54 U/L Final  . Alkaline Phosphatase 11/06/2015 49  38 - 126 U/L Final  . Total Bilirubin 11/06/2015 0.5  0.3 - 1.2 mg/dL Final  . GFR calc non Af Amer 11/06/2015 26* >60 mL/min Final  . GFR calc Af Amer 11/06/2015 30* >60 mL/min Final   Comment: (NOTE) The eGFR has been calculated using the CKD EPI equation. This calculation has not been validated in all clinical situations. eGFR's persistently <60 mL/min signify possible Chronic Kidney Disease.   . Anion gap 11/06/2015 9  5 - 15 Final  . WBC 11/06/2015 9.8  4.0 - 10.5 K/uL Final  . RBC  11/06/2015 2.77* 3.87 - 5.11 MIL/uL Final  . Hemoglobin 11/06/2015 8.6* 12.0 - 15.0 g/dL Final  . HCT 63/87/5643 26.2* 36.0 - 46.0 % Final  . MCV 11/06/2015 94.6  78.0 - 100.0 fL Final  . MCH  11/06/2015 31.0  26.0 - 34.0 pg Final  . MCHC 11/06/2015 32.8  30.0 - 36.0 g/dL Final  . RDW 78/46/9629 15.9* 11.5 - 15.5 % Final  . Platelets 11/06/2015 305  150 - 400 K/uL Final  . Neutrophils Relative % 11/06/2015 63  % Final  . Neutro Abs 11/06/2015 6.3  1.7 - 7.7 K/uL Final  . Lymphocytes Relative 11/06/2015 14  % Final  . Lymphs Abs 11/06/2015 1.3  0.7 - 4.0 K/uL Final  . Monocytes Relative 11/06/2015 7  % Final  . Monocytes Absolute 11/06/2015 0.6  0.1 - 1.0 K/uL Final  . Eosinophils Relative 11/06/2015 16  % Final  . Eosinophils Absolute 11/06/2015 1.6* 0.0 - 0.7 K/uL Final  . Basophils Relative 11/06/2015 0  % Final  . Basophils Absolute 11/06/2015 0.0  0.0 - 0.1 K/uL Final  . pH, Ven 11/06/2015 7.465* 7.250 - 7.300 Final  . pCO2, Ven 11/06/2015 32.6* 45.0 - 50.0 mmHg Final  . pO2, Ven 11/06/2015 31.0  31.0 - 45.0 mmHg Final  . Bicarbonate 11/06/2015 23.4  20.0 - 24.0 mEq/L Final  . TCO2 11/06/2015 24  0 - 100 mmol/L Final  . O2 Saturation 11/06/2015 64.0  % Final  . Patient temperature 11/06/2015 HIDE   Final  . Sample type 11/06/2015 VENOUS   Final  . Comment 11/06/2015 NOTIFIED PHYSICIAN   Final  . Troponin i, poc 11/06/2015 0.00  0.00 - 0.08 ng/mL Final  . Comment 3 11/06/2015          Final   Comment: Due to the release kinetics of cTnI, a negative result within the first hours of the onset of symptoms does not rule out myocardial infarction with certainty. If myocardial infarction is still suspected, repeat the test at appropriate intervals.   . Troponin i, poc 11/06/2015 0.02  0.00 - 0.08 ng/mL Final  . Comment 3 11/06/2015          Final   Comment: Due to the release kinetics of cTnI, a negative result within the first hours of the onset of symptoms does not rule  out myocardial infarction with certainty. If myocardial infarction is still suspected, repeat the test at appropriate intervals.   . Sodium 11/08/2015 134* 135 - 145 mmol/L Final  . Potassium 11/08/2015 4.8  3.5 - 5.1 mmol/L Final  . Chloride 11/08/2015 104  101 - 111 mmol/L Final  . CO2 11/08/2015 19* 22 - 32 mmol/L Final  . Glucose, Bld 11/08/2015 122* 65 - 99 mg/dL Final  . BUN 52/84/1324 36* 6 - 20 mg/dL Final  . Creatinine, Ser 11/08/2015 1.39* 0.44 - 1.00 mg/dL Final  . Calcium 40/01/2724 9.5  8.9 - 10.3 mg/dL Final  . GFR calc non Af Amer 11/08/2015 32* >60 mL/min Final  . GFR calc Af Amer 11/08/2015 37* >60 mL/min Final   Comment: (NOTE) The eGFR has been calculated using the CKD EPI equation. This calculation has not been validated in all clinical situations. eGFR's persistently <60 mL/min signify possible Chronic Kidney Disease.   . Anion gap 11/08/2015 11  5 - 15 Final  . TSH 11/08/2015 51.089* 0.350 - 4.500 uIU/mL Final  . Free T4 11/08/2015 0.73  0.61 - 1.12 ng/dL Final   Comment: (NOTE) Biotin ingestion may interfere with free T4 tests. If the results are inconsistent with the TSH level, previous test results, or the clinical presentation, then consider biotin interference. If needed, order repeat testing after stopping biotin.   . T3, Free 11/09/2015  1.3* 2.0 - 4.4 pg/mL Final   Comment: (NOTE) Performed At: [facility] [address] [place], [place] [id number] Mila Homer MD KG:[id number]   . WBC 11/09/2015 6.3  4.0 - 10.5 K/uL Final  . RBC 11/09/2015 2.95* 3.87 - 5.11 MIL/uL Final  . Hemoglobin 11/09/2015 9.2* 12.0 - 15.0 g/dL Final  . HCT 64/40/3474 27.7* 36.0 - 46.0 % Final  . MCV 11/09/2015 93.9  78.0 - 100.0 fL Final  . MCH 11/09/2015 31.2  26.0 - 34.0 pg Final  . MCHC 11/09/2015 33.2  30.0 - 36.0 g/dL Final  . RDW 25/95/6387 16.1* 11.5 - 15.5 % Final  . Platelets 11/09/2015 332  150 - 400 K/uL Final  . Sodium 11/09/2015 133* 135 - 145  mmol/L Final  . Potassium 11/09/2015 4.8  3.5 - 5.1 mmol/L Final  . Chloride 11/09/2015 103  101 - 111 mmol/L Final  . CO2 11/09/2015 20* 22 - 32 mmol/L Final  . Glucose, Bld 11/09/2015 135* 65 - 99 mg/dL Final  . BUN 56/43/3295 35* 6 - 20 mg/dL Final  . Creatinine, Ser 11/09/2015 1.29* 0.44 - 1.00 mg/dL Final  . Calcium 18/84/1660 9.8  8.9 - 10.3 mg/dL Final  . GFR calc non Af Amer 11/09/2015 35* >60 mL/min Final  . GFR calc Af Amer 11/09/2015 41* >60 mL/min Final   Comment: (NOTE) The eGFR has been calculated using the CKD EPI equation. This calculation has not been validated in all clinical situations. eGFR's persistently <60 mL/min signify possible Chronic Kidney Disease.   . Anion gap 11/09/2015 10  5 - 15 Final  . Magnesium 11/09/2015 2.0  1.7 - 2.4 mg/dL Final  . WBC 63/04/6008 6.8  4.0 - 10.5 K/uL Final  . RBC 11/10/2015 2.77* 3.87 - 5.11 MIL/uL Final  . Hemoglobin 11/10/2015 9.0* 12.0 - 15.0 g/dL Final  . HCT 93/23/5573 26.4* 36.0 - 46.0 % Final  . MCV 11/10/2015 95.3  78.0 - 100.0 fL Final  . MCH 11/10/2015 32.5  26.0 - 34.0 pg Final  . MCHC 11/10/2015 34.1  30.0 - 36.0 g/dL Final  . RDW 22/05/5425 16.1* 11.5 - 15.5 % Final  . Platelets 11/10/2015 321  150 - 400 K/uL Final  . Sodium 11/10/2015 133* 135 - 145 mmol/L Final  . Potassium 11/10/2015 4.4  3.5 - 5.1 mmol/L Final  . Chloride 11/10/2015 103  101 - 111 mmol/L Final  . CO2 11/10/2015 20* 22 - 32 mmol/L Final  . Glucose, Bld 11/10/2015 154* 65 - 99 mg/dL Final  . BUN 09/24/7626 40* 6 - 20 mg/dL Final  . Creatinine, Ser 11/10/2015 1.34* 0.44 - 1.00 mg/dL Final  . Calcium 31/51/7616 9.3  8.9 - 10.3 mg/dL Final  . GFR calc non Af Amer 11/10/2015 34* >60 mL/min Final  . GFR calc Af Amer 11/10/2015 39* >60 mL/min Final   Comment: (NOTE) The eGFR has been calculated using the CKD EPI equation. This calculation has not been validated in all clinical situations. eGFR's persistently <60 mL/min signify possible Chronic  Kidney Disease.   . Anion gap 11/10/2015 10  5 - 15 Final  . WBC 11/11/2015 8.6  4.0 - 10.5 K/uL Final  . RBC 11/11/2015 2.58* 3.87 - 5.11 MIL/uL Final  . Hemoglobin 11/11/2015 8.3* 12.0 - 15.0 g/dL Final  . HCT 07/37/1062 24.7* 36.0 - 46.0 % Final  . MCV 11/11/2015 95.7  78.0 - 100.0 fL Final  . MCH 11/11/2015 32.2  26.0 - 34.0 pg Final  .  MCHC 11/11/2015 33.6  30.0 - 36.0 g/dL Final  . RDW 40/98/1191 16.2* 11.5 - 15.5 % Final  . Platelets 11/11/2015 287  150 - 400 K/uL Final  . Sodium 11/11/2015 133* 135 - 145 mmol/L Final  . Potassium 11/11/2015 4.2  3.5 - 5.1 mmol/L Final  . Chloride 11/11/2015 104  101 - 111 mmol/L Final  . CO2 11/11/2015 20* 22 - 32 mmol/L Final  . Glucose, Bld 11/11/2015 157* 65 - 99 mg/dL Final  . BUN 47/82/9562 39* 6 - 20 mg/dL Final  . Creatinine, Ser 11/11/2015 1.36* 0.44 - 1.00 mg/dL Final  . Calcium 13/11/6576 9.1  8.9 - 10.3 mg/dL Final  . GFR calc non Af Amer 11/11/2015 33* >60 mL/min Final  . GFR calc Af Amer 11/11/2015 38* >60 mL/min Final   Comment: (NOTE) The eGFR has been calculated using the CKD EPI equation. This calculation has not been validated in all clinical situations. eGFR's persistently <60 mL/min signify possible Chronic Kidney Disease.   . Anion gap 11/11/2015 9  5 - 15 Final  . Magnesium 11/11/2015 1.8  1.7 - 2.4 mg/dL Final  Office Visit on 10/06/2015  Component Date Value Ref Range Status  . WBC 10/07/2015 7.9  3.4 - 10.8 x10E3/uL Final  . RBC 10/07/2015 2.45* 3.77 - 5.28 x10E6/uL Final  . Hemoglobin 10/07/2015 8.0* 11.1 - 15.9 g/dL Final  . Hematocrit 46/96/2952 24.4* 34.0 - 46.6 % Final  . MCV 10/07/2015 100* 79 - 97 fL Final  . MCH 10/07/2015 32.7  26.6 - 33.0 pg Final  . MCHC 10/07/2015 32.8  31.5 - 35.7 g/dL Final  . RDW 84/13/2440 16.5* 12.3 - 15.4 % Final  . Platelets 10/07/2015 331  150 - 379 x10E3/uL Final  . Neutrophils 10/07/2015 49  % Final  . Lymphs 10/07/2015 15  % Final  . Monocytes 10/07/2015 5  % Final    . Eos 10/07/2015 30  % Final  . Basos 10/07/2015 1  % Final  . Neutrophils Absolute 10/07/2015 3.8  1.4 - 7.0 x10E3/uL Final  . Lymphocytes Absolute 10/07/2015 1.2  0.7 - 3.1 x10E3/uL Final  . Monocytes Absolute 10/07/2015 0.4  0.1 - 0.9 x10E3/uL Final  . EOS (ABSOLUTE) 10/07/2015 2.4* 0.0 - 0.4 x10E3/uL Final  . Basophils Absolute 10/07/2015 0.1  0.0 - 0.2 x10E3/uL Final  . Immature Granulocytes 10/07/2015 0  % Final  . Immature Grans (Abs) 10/07/2015 0.0  0.0 - 0.1 x10E3/uL Final  . Hematology Comments: 10/07/2015 Note:   Final  . Glucose 10/07/2015 94  65 - 99 mg/dL Final  . BUN 01/28/2535 25  10 - 36 mg/dL Final  . Creatinine, Ser 10/07/2015 1.66* 0.57 - 1.00 mg/dL Final  . GFR calc non Af Amer 10/07/2015 27* >59 mL/min/1.73 Final  . GFR calc Af Amer 10/07/2015 31* >59 mL/min/1.73 Final  . BUN/Creatinine Ratio 10/07/2015 15  12 - 28 Final  . Sodium 10/07/2015 135  134 - 144 mmol/L Final  . Potassium 10/07/2015 4.8  3.5 - 5.2 mmol/L Final  . Chloride 10/07/2015 96  96 - 106 mmol/L Final  . CO2 10/07/2015 26  18 - 29 mmol/L Final  . Calcium 10/07/2015 9.4  8.7 - 10.3 mg/dL Final  . Total Protein 10/07/2015 7.1  6.0 - 8.5 g/dL Final  . Albumin 64/40/3474 3.5  3.2 - 4.6 g/dL Final  . Globulin, Total 10/07/2015 3.6  1.5 - 4.5 g/dL Final  . Albumin/Globulin Ratio 10/07/2015 1.0* 1.2 - 2.2 Final  .  Bilirubin Total 10/07/2015 0.2  0.0 - 1.2 mg/dL Final  . Alkaline Phosphatase 10/07/2015 47  39 - 117 IU/L Final  . AST 10/07/2015 19  0 - 40 IU/L Final  . ALT 10/07/2015 12  0 - 32 IU/L Final  . Magnesium 10/07/2015 1.8  1.6 - 2.3 mg/dL Final  . TSH 84/69/6295 81.690* 0.450 - 4.500 uIU/mL Final  Admission on 09/11/2015, Discharged on 09/30/2015  No results displayed because visit has over 200 results.      No results found.   Assessment/Plan   ICD-9-CM ICD-10-CM   1. Protein-calorie malnutrition, severe 262 E43   2. Gastroparesis s/p GJ tube 536.3 K31.84   3. Asthma with allergic  rhinitis, unspecified asthma severity, uncomplicated 493.00 J45.909   4. Paroxysmal atrial fibrillation (HCC) 427.31 I48.0   5. Hypothyroidism, unspecified hypothyroidism type 244.9 E03.9   6. CKD (chronic kidney disease) stage 3, GFR 30-59 ml/min 585.3 N18.3   7. Essential hypertension 401.9 I10   8. Bilateral leg edema 782.3 R60.0   9. Multiple nevi 216.9 D22.9   10. Encounter for immunization Z23 Z23 Flu Vaccine QUAD 36+ mos IM    Flu shot given today  may use corn starch under breasts daily for itching  Continue current medications as ordered  Repeat Thyroid lab in 6 weeks  Wear TED stockings daily when up and walking  Follow up in 2 mos for routine visit   Gerldine Suleiman S. Ancil Linsey  [facility] [address] [place], [place] [address] [phone] Cell (Monday-Friday 8 AM - 5 PM) [phone] After 5 PM and follow prompts

## 2015-12-29 NOTE — Patient Instructions (Addendum)
may use corn starch under breasts daily for itching  Continue current medications as ordered  Repeat Thyroid lab in 6 weeks  Wear TED stockings daily when up and walking  Follow up in 2 mos for routine visit

## 2015-12-31 ENCOUNTER — Telehealth: Payer: Self-pay

## 2015-12-31 NOTE — Telephone Encounter (Signed)
Discussed response with Mrs.Armstrong. Mrs.Penbrook verbalized understanding of recommendations

## 2015-12-31 NOTE — Telephone Encounter (Signed)
Yesterday patient vomited x 1. Patient has a feeding tube and her daughter question's if this is something to be expected. Patient feels fine since episode and is currently in no distress.  Please review and advise

## 2015-12-31 NOTE — Telephone Encounter (Signed)
She has gastric outlet obstruction and may experience vomiting occasionally. Go to the ER if N/V persists and/or associated with f/c, abdominal pain, diarrhea, bloody stools/emesis.

## 2016-01-02 DIAGNOSIS — I82409 Acute embolism and thrombosis of unspecified deep veins of unspecified lower extremity: Secondary | ICD-10-CM

## 2016-01-02 HISTORY — DX: Acute embolism and thrombosis of unspecified deep veins of unspecified lower extremity: I82.409

## 2016-01-03 ENCOUNTER — Ambulatory Visit
Admission: RE | Admit: 2016-01-03 | Discharge: 2016-01-03 | Disposition: A | Discharge status: Home / Self Care | Payer: Medicare Other | Source: Ambulatory Visit | Attending: Internal Medicine | Admitting: Internal Medicine

## 2016-01-03 DIAGNOSIS — M542 Cervicalgia: Secondary | ICD-10-CM | POA: Diagnosis not present

## 2016-01-03 DIAGNOSIS — R140 Abdominal distension (gaseous): Secondary | ICD-10-CM

## 2016-01-03 DIAGNOSIS — K311 Adult hypertrophic pyloric stenosis: Secondary | ICD-10-CM

## 2016-01-04 ENCOUNTER — Other Ambulatory Visit: Payer: Self-pay | Admitting: *Deleted

## 2016-01-04 ENCOUNTER — Other Ambulatory Visit: Payer: Self-pay

## 2016-01-04 DIAGNOSIS — I6523 Occlusion and stenosis of bilateral carotid arteries: Secondary | ICD-10-CM

## 2016-01-04 DIAGNOSIS — M47812 Spondylosis without myelopathy or radiculopathy, cervical region: Secondary | ICD-10-CM

## 2016-01-04 DIAGNOSIS — Z4659 Encounter for fitting and adjustment of other gastrointestinal appliance and device: Secondary | ICD-10-CM

## 2016-01-05 ENCOUNTER — Ambulatory Visit (INDEPENDENT_AMBULATORY_CARE_PROVIDER_SITE_OTHER): Payer: Medicare Other | Admitting: Podiatry

## 2016-01-05 DIAGNOSIS — L603 Nail dystrophy: Secondary | ICD-10-CM

## 2016-01-05 DIAGNOSIS — L608 Other nail disorders: Secondary | ICD-10-CM

## 2016-01-05 DIAGNOSIS — M79609 Pain in unspecified limb: Secondary | ICD-10-CM

## 2016-01-05 DIAGNOSIS — M79676 Pain in unspecified toe(s): Secondary | ICD-10-CM

## 2016-01-05 DIAGNOSIS — B351 Tinea unguium: Secondary | ICD-10-CM | POA: Diagnosis not present

## 2016-01-05 DIAGNOSIS — L89891 Pressure ulcer of other site, stage 1: Secondary | ICD-10-CM

## 2016-01-05 DIAGNOSIS — E0843 Diabetes mellitus due to underlying condition with diabetic autonomic (poly)neuropathy: Secondary | ICD-10-CM

## 2016-01-05 DIAGNOSIS — R531 Weakness: Secondary | ICD-10-CM | POA: Diagnosis not present

## 2016-01-05 DIAGNOSIS — L97521 Non-pressure chronic ulcer of other part of left foot limited to breakdown of skin: Principal | ICD-10-CM

## 2016-01-05 DIAGNOSIS — E11621 Type 2 diabetes mellitus with foot ulcer: Secondary | ICD-10-CM

## 2016-01-05 DIAGNOSIS — R001 Bradycardia, unspecified: Secondary | ICD-10-CM | POA: Diagnosis not present

## 2016-01-06 ENCOUNTER — Other Ambulatory Visit: Payer: Self-pay

## 2016-01-06 ENCOUNTER — Telehealth: Payer: Self-pay

## 2016-01-06 DIAGNOSIS — R1319 Other dysphagia: Secondary | ICD-10-CM

## 2016-01-06 DIAGNOSIS — K311 Adult hypertrophic pyloric stenosis: Secondary | ICD-10-CM

## 2016-01-06 HISTORY — PX: IR GENERIC HISTORICAL: IMG1180011

## 2016-01-06 NOTE — Telephone Encounter (Signed)
I spoke with patients daughter regarding appt at [facility] at 2:30 on 01/07/2016 for feeding tube replacement. Per Dr. Eulas Post nothing to eat or drink by mouth and nothing to eat by feeding tube 8 hours prior to appt, order placed

## 2016-01-06 NOTE — Telephone Encounter (Signed)
Message left on triage voicemail to inform Dr.Carter that order for Feeding Tube was placed for [facility] and they do not handle this type of service.  Referral will need to be placed for an appropriate service provider of feeding tubes

## 2016-01-07 ENCOUNTER — Other Ambulatory Visit: Payer: Self-pay | Admitting: Internal Medicine

## 2016-01-07 ENCOUNTER — Encounter (HOSPITAL_COMMUNITY): Payer: Self-pay | Admitting: Interventional Radiology

## 2016-01-07 ENCOUNTER — Ambulatory Visit (HOSPITAL_COMMUNITY)
Admission: RE | Admit: 2016-01-07 | Discharge: 2016-01-07 | Disposition: A | Discharge status: Home / Self Care | Payer: Medicare Other | Source: Ambulatory Visit | Attending: Internal Medicine | Admitting: Internal Medicine

## 2016-01-07 DIAGNOSIS — K9423 Gastrostomy malfunction: Secondary | ICD-10-CM | POA: Diagnosis not present

## 2016-01-07 DIAGNOSIS — K311 Adult hypertrophic pyloric stenosis: Secondary | ICD-10-CM

## 2016-01-07 DIAGNOSIS — K3184 Gastroparesis: Secondary | ICD-10-CM | POA: Diagnosis not present

## 2016-01-07 DIAGNOSIS — R1319 Other dysphagia: Secondary | ICD-10-CM

## 2016-01-07 DIAGNOSIS — Z434 Encounter for attention to other artificial openings of digestive tract: Secondary | ICD-10-CM | POA: Diagnosis not present

## 2016-01-07 HISTORY — PX: IR GENERIC HISTORICAL: IMG1180011

## 2016-01-07 MED ORDER — IOPAMIDOL (ISOVUE-300) INJECTION 61%
INTRAVENOUS | Status: DC | PRN
  Administered 2016-01-07: 80 mL

## 2016-01-07 MED ORDER — IOPAMIDOL (ISOVUE-300) INJECTION 61%: 50.0000 mL | Freq: Once | INTRAVENOUS | Status: DC | PRN

## 2016-01-07 MED ORDER — SILVER NITRATE-POT NITRATE 75-25 % EX MISC
CUTANEOUS | Status: AC
  Filled 2016-01-07: qty 1

## 2016-01-07 NOTE — Telephone Encounter (Signed)
Noted  

## 2016-01-07 NOTE — Telephone Encounter (Signed)
Called patients daughter to verify her appt at [facility] at 2:30pm. I told her the appt was at [facility] but its at [place] long patient is notified.

## 2016-01-08 NOTE — Progress Notes (Signed)
SUBJECTIVE Patient with a history of diabetes mellitus presents to office today complaining of elongated, thickened nails. Pain while ambulating in shoes. Patient is unable to trim their own nails.  Patient also complains of painful callus lesion an ulcer to the second digit left foot.  OBJECTIVE General Patient is awake, alert, and oriented x 3 and in no acute distress. Derm ulcer noted to the second digit left foot distal tuft. There is no purulence or malodor. Minimal drainage.  Skin is dry and supple bilateral. Negative open lesions or macerations. Remaining integument unremarkable. Nails are tender, long, thickened and dystrophic with subungual debris, consistent with onychomycosis, 1-5 bilateral. No signs of infection noted. Vasc  DP and PT pedal pulses palpable bilaterally. Temperature gradient within normal limits.  Neuro Epicritic and protective threshold sensation diminished bilaterally.  Musculoskeletal Exam No symptomatic pedal deformities noted bilateral. Muscular strength within normal limits.  ASSESSMENT 1. Diabetes Mellitus w/ peripheral neuropathy 2. Onychomycosis of nail due to dermatophyte bilateral 3. Pain in foot bilateral 4. Ulcer second digit left foot secondary to diabetes mellitus  PLAN OF CARE 1. Patient evaluated today. 2. Instructed to maintain good pedal hygiene and foot care. Stressed importance of controlling blood sugar.  3. Mechanical debridement of nails 1-5 bilaterally performed using a nail nipper. Filed with dremel without incident.  4. Medically necessary excisional debridement including partial skin tissue and deep dermal tissues performed to the left foot open wound second toe.  5. Return to clinic in 3 mos.    Edrick Kins, DPM

## 2016-01-14 ENCOUNTER — Telehealth: Payer: Self-pay | Admitting: Internal Medicine

## 2016-01-14 ENCOUNTER — Other Ambulatory Visit (HOSPITAL_COMMUNITY): Payer: Self-pay | Admitting: Interventional Radiology

## 2016-01-14 DIAGNOSIS — R633 Feeding difficulties, unspecified: Secondary | ICD-10-CM

## 2016-01-14 NOTE — Telephone Encounter (Signed)
left message asking pt to call and schedule AWV. explained we have openings for next week. VDM (dee-dee)

## 2016-01-17 ENCOUNTER — Other Ambulatory Visit (HOSPITAL_COMMUNITY): Payer: Medicare Other

## 2016-01-18 DIAGNOSIS — R001 Bradycardia, unspecified: Secondary | ICD-10-CM | POA: Diagnosis not present

## 2016-01-18 DIAGNOSIS — R531 Weakness: Secondary | ICD-10-CM | POA: Diagnosis not present

## 2016-01-19 ENCOUNTER — Telehealth: Payer: Self-pay | Admitting: *Deleted

## 2016-01-19 NOTE — Telephone Encounter (Signed)
Pt's dtr, [name] states Dr. Amalia Hailey ordered a cream from [place] and we have not heard anything from the pharmacy. I told [name] that itwoutl discuss with Dr. Amalia Hailey and call again.

## 2016-01-20 MED ORDER — NONFORMULARY OR COMPOUNDED ITEM: 1.0000 g | Freq: Four times a day (QID) | 2 refills | Status: DC

## 2016-01-20 NOTE — Addendum Note (Signed)
Addended by: Johnnye Lana A on: 01/20/2016 05:34 PM   Modules accepted: Orders

## 2016-01-24 ENCOUNTER — Observation Stay (HOSPITAL_COMMUNITY): Payer: Medicare Other

## 2016-01-24 ENCOUNTER — Ambulatory Visit (INDEPENDENT_AMBULATORY_CARE_PROVIDER_SITE_OTHER): Payer: Medicare Other | Admitting: Physician Assistant

## 2016-01-24 ENCOUNTER — Inpatient Hospital Stay (HOSPITAL_COMMUNITY)
Admission: AD | Admit: 2016-01-24 | Discharge: 2016-02-02 | DRG: 291 | Disposition: A | Discharge status: Home / Self Care | Payer: Medicare Other | Source: Ambulatory Visit | Attending: Cardiology | Admitting: Cardiology

## 2016-01-24 ENCOUNTER — Other Ambulatory Visit: Payer: Self-pay | Admitting: Physician Assistant

## 2016-01-24 ENCOUNTER — Encounter: Payer: Self-pay | Admitting: Physician Assistant

## 2016-01-24 VITALS — BP 162/70 | HR 115 | Ht 66.0 in | Wt 148.4 lb

## 2016-01-24 DIAGNOSIS — K5900 Constipation, unspecified: Secondary | ICD-10-CM | POA: Diagnosis present

## 2016-01-24 DIAGNOSIS — I5033 Acute on chronic diastolic (congestive) heart failure: Secondary | ICD-10-CM | POA: Diagnosis not present

## 2016-01-24 DIAGNOSIS — Z09 Encounter for follow-up examination after completed treatment for conditions other than malignant neoplasm: Secondary | ICD-10-CM

## 2016-01-24 DIAGNOSIS — J9600 Acute respiratory failure, unspecified whether with hypoxia or hypercapnia: Secondary | ICD-10-CM

## 2016-01-24 DIAGNOSIS — B999 Unspecified infectious disease: Secondary | ICD-10-CM

## 2016-01-24 DIAGNOSIS — I4892 Unspecified atrial flutter: Secondary | ICD-10-CM | POA: Diagnosis present

## 2016-01-24 DIAGNOSIS — Z8249 Family history of ischemic heart disease and other diseases of the circulatory system: Secondary | ICD-10-CM

## 2016-01-24 DIAGNOSIS — J45909 Unspecified asthma, uncomplicated: Secondary | ICD-10-CM | POA: Diagnosis present

## 2016-01-24 DIAGNOSIS — Z961 Presence of intraocular lens: Secondary | ICD-10-CM | POA: Diagnosis present

## 2016-01-24 DIAGNOSIS — Z9841 Cataract extraction status, right eye: Secondary | ICD-10-CM

## 2016-01-24 DIAGNOSIS — Y833 Surgical operation with formation of external stoma as the cause of abnormal reaction of the patient, or of later complication, without mention of misadventure at the time of the procedure: Secondary | ICD-10-CM | POA: Diagnosis present

## 2016-01-24 DIAGNOSIS — J3489 Other specified disorders of nose and nasal sinuses: Secondary | ICD-10-CM

## 2016-01-24 DIAGNOSIS — I10 Essential (primary) hypertension: Secondary | ICD-10-CM

## 2016-01-24 DIAGNOSIS — Z9842 Cataract extraction status, left eye: Secondary | ICD-10-CM

## 2016-01-24 DIAGNOSIS — I482 Chronic atrial fibrillation: Secondary | ICD-10-CM | POA: Diagnosis present

## 2016-01-24 DIAGNOSIS — Z7901 Long term (current) use of anticoagulants: Secondary | ICD-10-CM

## 2016-01-24 DIAGNOSIS — K3184 Gastroparesis: Secondary | ICD-10-CM | POA: Diagnosis present

## 2016-01-24 DIAGNOSIS — E1122 Type 2 diabetes mellitus with diabetic chronic kidney disease: Secondary | ICD-10-CM | POA: Diagnosis present

## 2016-01-24 DIAGNOSIS — Z9049 Acquired absence of other specified parts of digestive tract: Secondary | ICD-10-CM

## 2016-01-24 DIAGNOSIS — T85598A Other mechanical complication of other gastrointestinal prosthetic devices, implants and grafts, initial encounter: Secondary | ICD-10-CM

## 2016-01-24 DIAGNOSIS — E032 Hypothyroidism due to medicaments and other exogenous substances: Secondary | ICD-10-CM | POA: Diagnosis not present

## 2016-01-24 DIAGNOSIS — Z79899 Other long term (current) drug therapy: Secondary | ICD-10-CM

## 2016-01-24 DIAGNOSIS — Z931 Gastrostomy status: Secondary | ICD-10-CM

## 2016-01-24 DIAGNOSIS — M6281 Muscle weakness (generalized): Secondary | ICD-10-CM

## 2016-01-24 DIAGNOSIS — Z8711 Personal history of peptic ulcer disease: Secondary | ICD-10-CM

## 2016-01-24 DIAGNOSIS — K311 Adult hypertrophic pyloric stenosis: Secondary | ICD-10-CM | POA: Diagnosis present

## 2016-01-24 DIAGNOSIS — E873 Alkalosis: Secondary | ICD-10-CM | POA: Diagnosis present

## 2016-01-24 DIAGNOSIS — R001 Bradycardia, unspecified: Secondary | ICD-10-CM | POA: Diagnosis present

## 2016-01-24 DIAGNOSIS — I2510 Atherosclerotic heart disease of native coronary artery without angina pectoris: Secondary | ICD-10-CM | POA: Diagnosis present

## 2016-01-24 DIAGNOSIS — Z8673 Personal history of transient ischemic attack (TIA), and cerebral infarction without residual deficits: Secondary | ICD-10-CM

## 2016-01-24 DIAGNOSIS — J45901 Unspecified asthma with (acute) exacerbation: Secondary | ICD-10-CM | POA: Diagnosis not present

## 2016-01-24 DIAGNOSIS — I480 Paroxysmal atrial fibrillation: Secondary | ICD-10-CM | POA: Diagnosis present

## 2016-01-24 DIAGNOSIS — Z9889 Other specified postprocedural states: Secondary | ICD-10-CM

## 2016-01-24 DIAGNOSIS — E1143 Type 2 diabetes mellitus with diabetic autonomic (poly)neuropathy: Secondary | ICD-10-CM | POA: Diagnosis present

## 2016-01-24 DIAGNOSIS — E039 Hypothyroidism, unspecified: Secondary | ICD-10-CM | POA: Diagnosis present

## 2016-01-24 DIAGNOSIS — R062 Wheezing: Secondary | ICD-10-CM

## 2016-01-24 DIAGNOSIS — N289 Disorder of kidney and ureter, unspecified: Secondary | ICD-10-CM

## 2016-01-24 DIAGNOSIS — N189 Chronic kidney disease, unspecified: Secondary | ICD-10-CM | POA: Diagnosis present

## 2016-01-24 DIAGNOSIS — R0602 Shortness of breath: Secondary | ICD-10-CM | POA: Diagnosis not present

## 2016-01-24 DIAGNOSIS — I6523 Occlusion and stenosis of bilateral carotid arteries: Secondary | ICD-10-CM

## 2016-01-24 DIAGNOSIS — J9621 Acute and chronic respiratory failure with hypoxia: Secondary | ICD-10-CM | POA: Diagnosis present

## 2016-01-24 DIAGNOSIS — Z86718 Personal history of other venous thrombosis and embolism: Secondary | ICD-10-CM

## 2016-01-24 DIAGNOSIS — Z934 Other artificial openings of gastrointestinal tract status: Secondary | ICD-10-CM

## 2016-01-24 DIAGNOSIS — I82402 Acute embolism and thrombosis of unspecified deep veins of left lower extremity: Secondary | ICD-10-CM | POA: Diagnosis present

## 2016-01-24 DIAGNOSIS — F0390 Unspecified dementia without behavioral disturbance: Secondary | ICD-10-CM | POA: Diagnosis present

## 2016-01-24 DIAGNOSIS — Z4682 Encounter for fitting and adjustment of non-vascular catheter: Secondary | ICD-10-CM | POA: Diagnosis not present

## 2016-01-24 DIAGNOSIS — Z9851 Tubal ligation status: Secondary | ICD-10-CM

## 2016-01-24 DIAGNOSIS — I130 Hypertensive heart and chronic kidney disease with heart failure and stage 1 through stage 4 chronic kidney disease, or unspecified chronic kidney disease: Principal | ICD-10-CM | POA: Diagnosis present

## 2016-01-24 DIAGNOSIS — J449 Chronic obstructive pulmonary disease, unspecified: Secondary | ICD-10-CM | POA: Diagnosis present

## 2016-01-24 DIAGNOSIS — I4891 Unspecified atrial fibrillation: Secondary | ICD-10-CM | POA: Diagnosis present

## 2016-01-24 DIAGNOSIS — Z833 Family history of diabetes mellitus: Secondary | ICD-10-CM

## 2016-01-24 DIAGNOSIS — I5032 Chronic diastolic (congestive) heart failure: Secondary | ICD-10-CM | POA: Diagnosis present

## 2016-01-24 DIAGNOSIS — H109 Unspecified conjunctivitis: Secondary | ICD-10-CM | POA: Diagnosis present

## 2016-01-24 DIAGNOSIS — K219 Gastro-esophageal reflux disease without esophagitis: Secondary | ICD-10-CM | POA: Diagnosis present

## 2016-01-24 DIAGNOSIS — Z87891 Personal history of nicotine dependence: Secondary | ICD-10-CM

## 2016-01-24 DIAGNOSIS — Z823 Family history of stroke: Secondary | ICD-10-CM

## 2016-01-24 HISTORY — DX: Unspecified transfusion reaction, initial encounter: T80.92XA

## 2016-01-24 HISTORY — DX: Acute embolism and thrombosis of unspecified deep veins of unspecified lower extremity: I82.409

## 2016-01-24 HISTORY — DX: Other seasonal allergic rhinitis: J30.2

## 2016-01-24 LAB — CBC WITH DIFFERENTIAL/PLATELET
Basophils Absolute: 0.1 10*3/uL (ref 0.0–0.1)
Basophils Relative: 1 %
Eosinophils Absolute: 0.6 10*3/uL (ref 0.0–0.7)
Eosinophils Relative: 9 %
HCT: 28.9 % — ABNORMAL LOW (ref 36.0–46.0)
Hemoglobin: 9.7 g/dL — ABNORMAL LOW (ref 12.0–15.0)
Lymphocytes Relative: 21 %
Lymphs Abs: 1.4 10*3/uL (ref 0.7–4.0)
MCH: 29.1 pg (ref 26.0–34.0)
MCHC: 33.6 g/dL (ref 30.0–36.0)
MCV: 86.8 fL (ref 78.0–100.0)
Monocytes Absolute: 0.6 10*3/uL (ref 0.1–1.0)
Monocytes Relative: 9 %
Neutro Abs: 3.8 10*3/uL (ref 1.7–7.7)
Neutrophils Relative %: 60 %
Platelets: 370 10*3/uL (ref 150–400)
RBC: 3.33 MIL/uL — ABNORMAL LOW (ref 3.87–5.11)
RDW: 16.2 % — ABNORMAL HIGH (ref 11.5–15.5)
WBC: 6.4 10*3/uL (ref 4.0–10.5)

## 2016-01-24 LAB — BASIC METABOLIC PANEL
Anion gap: 11 (ref 5–15)
BUN: 44 mg/dL — ABNORMAL HIGH (ref 6–20)
CO2: 21 mmol/L — ABNORMAL LOW (ref 22–32)
Calcium: 10.1 mg/dL (ref 8.9–10.3)
Chloride: 100 mmol/L — ABNORMAL LOW (ref 101–111)
Creatinine, Ser: 1.51 mg/dL — ABNORMAL HIGH (ref 0.44–1.00)
GFR calc Af Amer: 34 mL/min — ABNORMAL LOW (ref >60)
GFR calc non Af Amer: 29 mL/min — ABNORMAL LOW (ref >60)
Glucose, Bld: 95 mg/dL (ref 65–99)
Potassium: 4.9 mmol/L (ref 3.5–5.1)
Sodium: 132 mmol/L — ABNORMAL LOW (ref 135–145)

## 2016-01-24 LAB — HEPATIC FUNCTION PANEL
ALT: 26 U/L (ref 14–54)
AST: 39 U/L (ref 15–41)
Albumin: 3.8 g/dL (ref 3.5–5.0)
Alkaline Phosphatase: 68 U/L (ref 38–126)
Bilirubin, Direct: <0.1 mg/dL — ABNORMAL LOW (ref 0.1–0.5)
Total Bilirubin: 0.3 mg/dL (ref 0.3–1.2)
Total Protein: 8.1 g/dL (ref 6.5–8.1)

## 2016-01-24 LAB — BRAIN NATRIURETIC PEPTIDE: B Natriuretic Peptide: 431.5 pg/mL — ABNORMAL HIGH (ref 0.0–100.0)

## 2016-01-24 LAB — TROPONIN I: Troponin I: <0.03 ng/mL (ref <0.03)

## 2016-01-24 LAB — GLUCOSE, CAPILLARY: Glucose-Capillary: 123 mg/dL — ABNORMAL HIGH (ref 65–99)

## 2016-01-24 MED ORDER — METHYLPREDNISOLONE SODIUM SUCC 40 MG IJ SOLR
40.0000 mg | Freq: Four times a day (QID) | INTRAMUSCULAR | Status: DC
  Administered 2016-01-24 – 2016-01-25 (×2): 40 mg via INTRAVENOUS
  Filled 2016-01-24 (×2): qty 1

## 2016-01-24 MED ORDER — ONDANSETRON HCL 4 MG/2ML IJ SOLN: 4.0000 mg | Freq: Four times a day (QID) | INTRAMUSCULAR | Status: DC | PRN

## 2016-01-24 MED ORDER — AMIODARONE HCL IN DEXTROSE 360-4.14 MG/200ML-% IV SOLN
30.0000 mg/h | INTRAVENOUS | Status: DC
  Administered 2016-01-24 – 2016-01-26 (×3): 30 mg/h via INTRAVENOUS
  Filled 2016-01-24 (×4): qty 200

## 2016-01-24 MED ORDER — CALCIUM CARBONATE-VITAMIN D 500-200 MG-UNIT PO TABS
1.0000 | ORAL_TABLET | Freq: Every day | ORAL | Status: DC
  Administered 2016-01-25 – 2016-02-02 (×9): 1 via ORAL
  Filled 2016-01-24 (×10): qty 1

## 2016-01-24 MED ORDER — FUROSEMIDE 10 MG/ML IJ SOLN
80.0000 mg | Freq: Once | INTRAMUSCULAR | Status: AC
Start: 2016-01-24 — End: 2016-01-24
  Administered 2016-01-24: 80 mg via INTRAVENOUS

## 2016-01-24 MED ORDER — POLYVINYL ALCOHOL 1.4 % OP SOLN
1.0000 [drp] | Freq: Four times a day (QID) | OPHTHALMIC | Status: DC
  Administered 2016-01-24 – 2016-02-02 (×32): 1 [drp] via OPHTHALMIC
  Filled 2016-01-24: qty 15

## 2016-01-24 MED ORDER — GABAPENTIN 300 MG PO CAPS
300.0000 mg | ORAL_CAPSULE | Freq: Three times a day (TID) | ORAL | Status: DC
  Administered 2016-01-24 – 2016-02-02 (×28): 300 mg via ORAL
  Filled 2016-01-24 (×28): qty 1

## 2016-01-24 MED ORDER — OSMOLITE 1.5 CAL PO LIQD
1000.0000 mL | ORAL | Status: DC
  Filled 2016-01-24 (×2): qty 1000

## 2016-01-24 MED ORDER — CYCLOSPORINE 0.05 % OP EMUL
1.0000 [drp] | Freq: Two times a day (BID) | OPHTHALMIC | Status: DC
  Administered 2016-01-24 – 2016-02-02 (×17): 1 [drp] via OPHTHALMIC
  Filled 2016-01-24 (×21): qty 1

## 2016-01-24 MED ORDER — ACETAMINOPHEN 325 MG PO TABS: 650.0000 mg | ORAL_TABLET | ORAL | Status: DC | PRN

## 2016-01-24 MED ORDER — PANTOPRAZOLE SODIUM 40 MG PO TBEC
40.0000 mg | DELAYED_RELEASE_TABLET | Freq: Two times a day (BID) | ORAL | Status: DC
  Administered 2016-01-24 – 2016-02-02 (×18): 40 mg via ORAL
  Filled 2016-01-24 (×18): qty 1

## 2016-01-24 MED ORDER — TRAMADOL HCL 50 MG PO TABS: 50.0000 mg | ORAL_TABLET | Freq: Every day | ORAL | Status: DC | PRN

## 2016-01-24 MED ORDER — FUROSEMIDE 10 MG/ML IJ SOLN
INTRAMUSCULAR | Status: AC
  Administered 2016-01-24: 80 mg via INTRAVENOUS
  Filled 2016-01-24: qty 8

## 2016-01-24 MED ORDER — POLYETHYLENE GLYCOL 3350 17 G PO PACK
17.0000 g | PACK | Freq: Two times a day (BID) | ORAL | Status: DC
  Administered 2016-01-24 – 2016-02-02 (×9): 17 g via ORAL
  Filled 2016-01-24 (×14): qty 1

## 2016-01-24 MED ORDER — BOOST / RESOURCE BREEZE PO LIQD: 1.0000 | Freq: Every day | ORAL | Status: DC

## 2016-01-24 MED ORDER — IPRATROPIUM-ALBUTEROL 0.5-2.5 (3) MG/3ML IN SOLN
3.0000 mL | Freq: Four times a day (QID) | RESPIRATORY_TRACT | Status: DC
  Administered 2016-01-24 – 2016-01-26 (×8): 3 mL via RESPIRATORY_TRACT
  Filled 2016-01-24 (×8): qty 3

## 2016-01-24 MED ORDER — ATORVASTATIN CALCIUM 10 MG PO TABS
10.0000 mg | ORAL_TABLET | Freq: Every day | ORAL | Status: DC
  Administered 2016-01-24 – 2016-02-02 (×10): 10 mg via ORAL
  Filled 2016-01-24 (×10): qty 1

## 2016-01-24 MED ORDER — ACETYLCYSTEINE 20 % IN SOLN: 70.0000 mg/kg | Freq: Three times a day (TID) | RESPIRATORY_TRACT | Status: DC

## 2016-01-24 MED ORDER — FUROSEMIDE 10 MG/ML IJ SOLN
40.0000 mg | Freq: Two times a day (BID) | INTRAMUSCULAR | Status: DC
  Administered 2016-01-25 (×2): 40 mg via INTRAVENOUS
  Filled 2016-01-24 (×3): qty 4

## 2016-01-24 MED ORDER — METHYLPREDNISOLONE SODIUM SUCC 125 MG IJ SOLR: 60.0000 mg | Freq: Four times a day (QID) | INTRAMUSCULAR | Status: DC

## 2016-01-24 MED ORDER — BOOST / RESOURCE BREEZE PO LIQD
1.0000 | Freq: Every day | ORAL | Status: DC
  Administered 2016-01-25 – 2016-02-02 (×8): 1 via ORAL

## 2016-01-24 MED ORDER — SODIUM CHLORIDE 1 G PO TABS: 1.0000 g | ORAL_TABLET | Freq: Three times a day (TID) | ORAL | Status: DC

## 2016-01-24 MED ORDER — FREE WATER
60.0000 mL | Freq: Four times a day (QID) | Status: DC
  Administered 2016-01-24 – 2016-02-02 (×35): 60 mL

## 2016-01-24 MED ORDER — FUROSEMIDE 10 MG/ML IJ SOLN: 40.0000 mg | Freq: Every day | INTRAMUSCULAR | Status: DC

## 2016-01-24 MED ORDER — SODIUM CHLORIDE 0.9% FLUSH: 3.0000 mL | INTRAVENOUS | Status: DC | PRN

## 2016-01-24 MED ORDER — IPRATROPIUM-ALBUTEROL 0.5-2.5 (3) MG/3ML IN SOLN: 3.0000 mL | Freq: Four times a day (QID) | RESPIRATORY_TRACT | Status: DC | PRN

## 2016-01-24 MED ORDER — ALBUTEROL SULFATE HFA 108 (90 BASE) MCG/ACT IN AERS: 2.0000 | INHALATION_SPRAY | Freq: Four times a day (QID) | RESPIRATORY_TRACT | Status: DC | PRN

## 2016-01-24 MED ORDER — SERTRALINE HCL 100 MG PO TABS
100.0000 mg | ORAL_TABLET | Freq: Every day | ORAL | Status: DC
  Administered 2016-01-24 – 2016-02-01 (×9): 100 mg via ORAL
  Filled 2016-01-24 (×10): qty 1

## 2016-01-24 MED ORDER — ISOSORBIDE MONONITRATE ER 30 MG PO TB24
30.0000 mg | ORAL_TABLET | Freq: Every day | ORAL | Status: DC
  Administered 2016-01-25 – 2016-02-02 (×9): 30 mg via ORAL
  Filled 2016-01-24 (×9): qty 1

## 2016-01-24 MED ORDER — MOMETASONE FURO-FORMOTEROL FUM 200-5 MCG/ACT IN AERO
2.0000 | INHALATION_SPRAY | Freq: Two times a day (BID) | RESPIRATORY_TRACT | Status: DC
  Administered 2016-01-24 – 2016-02-02 (×18): 2 via RESPIRATORY_TRACT
  Filled 2016-01-24: qty 8.8

## 2016-01-24 MED ORDER — LEVOTHYROXINE SODIUM 25 MCG PO TABS
137.0000 ug | ORAL_TABLET | Freq: Every day | ORAL | Status: DC
  Administered 2016-01-25 – 2016-02-02 (×9): 137 ug via ORAL
  Filled 2016-01-24 (×10): qty 1

## 2016-01-24 MED ORDER — BISACODYL 10 MG RE SUPP: 10.0000 mg | Freq: Every day | RECTAL | Status: DC | PRN

## 2016-01-24 MED ORDER — SODIUM CHLORIDE 0.9% FLUSH
3.0000 mL | Freq: Two times a day (BID) | INTRAVENOUS | Status: DC
  Administered 2016-01-26 – 2016-02-02 (×6): 3 mL via INTRAVENOUS

## 2016-01-24 MED ORDER — LEVALBUTEROL HCL 0.63 MG/3ML IN NEBU
0.6300 mg | INHALATION_SOLUTION | RESPIRATORY_TRACT | Status: AC
  Administered 2016-01-24: 0.63 mg via RESPIRATORY_TRACT
  Filled 2016-01-24: qty 3

## 2016-01-24 MED ORDER — LEVALBUTEROL HCL 0.63 MG/3ML IN NEBU
0.6300 mg | INHALATION_SOLUTION | Freq: Four times a day (QID) | RESPIRATORY_TRACT | Status: DC | PRN
  Administered 2016-01-28 – 2016-01-29 (×2): 0.63 mg via RESPIRATORY_TRACT
  Filled 2016-01-24 (×2): qty 3

## 2016-01-24 MED ORDER — AMLODIPINE BESYLATE 10 MG PO TABS
10.0000 mg | ORAL_TABLET | Freq: Every day | ORAL | Status: DC
  Administered 2016-01-25 – 2016-02-02 (×9): 10 mg via ORAL
  Filled 2016-01-24 (×10): qty 1

## 2016-01-24 MED ORDER — SODIUM CHLORIDE 0.9 % IV SOLN: 250.0000 mL | INTRAVENOUS | Status: DC | PRN

## 2016-01-24 MED ORDER — ONDANSETRON HCL 4 MG PO TABS: 4.0000 mg | ORAL_TABLET | Freq: Three times a day (TID) | ORAL | Status: DC | PRN

## 2016-01-24 MED ORDER — FLUTICASONE PROPIONATE 50 MCG/ACT NA SUSP
2.0000 | Freq: Every day | NASAL | Status: DC
  Administered 2016-01-25 – 2016-01-28 (×4): 2 via NASAL
  Filled 2016-01-24: qty 16

## 2016-01-24 MED ORDER — OSMOLITE 1.5 CAL PO LIQD: 1000.0000 mL | ORAL | Status: DC

## 2016-01-24 MED ORDER — HYDRALAZINE HCL 50 MG PO TABS
50.0000 mg | ORAL_TABLET | Freq: Three times a day (TID) | ORAL | Status: DC
  Administered 2016-01-24 – 2016-02-02 (×27): 50 mg via ORAL
  Filled 2016-01-24 (×27): qty 1

## 2016-01-24 MED ORDER — ISOSORBIDE MONONITRATE ER 30 MG PO TB24: 30.0000 mg | ORAL_TABLET | Freq: Every day | ORAL | Status: DC

## 2016-01-24 MED ORDER — FLUTICASONE PROPIONATE 50 MCG/ACT NA SUSP: 2.0000 | Freq: Every day | NASAL | Status: DC

## 2016-01-24 MED ORDER — APIXABAN 2.5 MG PO TABS
2.5000 mg | ORAL_TABLET | Freq: Two times a day (BID) | ORAL | Status: DC
  Administered 2016-01-24 – 2016-01-25 (×2): 2.5 mg via ORAL
  Filled 2016-01-24 (×2): qty 1

## 2016-01-24 MED ORDER — B COMPLEX-C PO TABS
1.0000 | ORAL_TABLET | Freq: Every day | ORAL | Status: DC
  Filled 2016-01-24: qty 1

## 2016-01-24 MED ORDER — POLYETHYLENE GLYCOL 3350 17 GM/SCOOP PO POWD
1.0000 | Freq: Two times a day (BID) | ORAL | Status: DC
  Filled 2016-01-24: qty 255

## 2016-01-24 MED ORDER — RESOURCE THICKENUP CLEAR PO POWD
1.0000 | ORAL | Status: DC | PRN
  Filled 2016-01-24: qty 125

## 2016-01-24 MED ORDER — SIMETHICONE 80 MG PO CHEW: 80.0000 mg | CHEWABLE_TABLET | Freq: Four times a day (QID) | ORAL | Status: DC | PRN

## 2016-01-24 MED ORDER — CHLORHEXIDINE GLUCONATE 0.12 % MT SOLN
15.0000 mL | Freq: Two times a day (BID) | OROMUCOSAL | Status: DC
  Administered 2016-01-24 – 2016-02-02 (×18): 15 mL via OROMUCOSAL
  Filled 2016-01-24 (×17): qty 15

## 2016-01-24 MED ORDER — LATANOPROST 0.005 % OP SOLN
1.0000 [drp] | Freq: Every day | OPHTHALMIC | Status: DC
  Administered 2016-01-24 – 2016-02-01 (×9): 1 [drp] via OPHTHALMIC
  Filled 2016-01-24: qty 2.5

## 2016-01-24 MED ORDER — ACETAMINOPHEN 325 MG PO TABS
650.0000 mg | ORAL_TABLET | Freq: Two times a day (BID) | ORAL | Status: DC
  Administered 2016-01-24 – 2016-02-02 (×18): 650 mg via ORAL
  Filled 2016-01-24 (×18): qty 2

## 2016-01-24 NOTE — Progress Notes (Addendum)
**Note Shannon-Identified via Obfuscation** Cardiology Office Note    Date:  01/24/2016   ID:  Shannon [name], DOB 1924-02-28, MRN [id number]  PCP:  Shannon Boys, DO  Cardiologist: Dr. Delton See  No chief complaint on file.   History of Present Illness:  Shannon [name] is a 82 y.o. female with a history of paroxysmal atrial fibrillation on amiodarone and Eliquis  as well as a history of hypothyroidism, bradycardia, non-obstructive coronary artery disease, type 2 diabetes, hypertension, cerebro-vascular disease and COPD/ashtma. She was admitted to the hospital in August with bradycardia and weakness. Her labetolol was discontinued and amiodarone was reduced from 200 mg daily to 100 mg daily. She last saw Shannon Medici, PA for post hospital follow-up and was in normal sinus rhythm at 64 bpm.  Patient comes in today extremely short of breath. Her asthma has been acting up and she's been using her inhalers a lot. Yesterday she said her heart rate would jump from 50 bpm to 118 bpm. Same is happening today. She's also developed edema in her lower extremities. She was told she had carotid disease when she had a x-ray of her neck after a fall. Carotid Dopplers are ordered by primary care. She is in rapid atrial flutter and her O2 sats are 87% in the office.     Past Medical History:  Diagnosis Date  . Anemia    previous blood transfusions  . Arthritis    "all over"  . Asthma   . Bradycardia    topped BB and amiodarone decreased- junctional rhythm   . COPD (chronic obstructive pulmonary disease) (HCC)   . Depression    "light case"  . Gastric stenosis   . GERD (gastroesophageal reflux disease)   . History of stomach ulcers   . Hyperlipidemia   . Hypertension   . Hypothyroidism   . Paraesophageal hernia   . Perforated gastric ulcer (HCC)   . Pneumonia   . Renal insufficiency   . SIADH (syndrome of inappropriate ADH production) (HCC)    Shannon [name] 01/10/2015  . Small bowel obstruction    "I don't know how many" (01/11/2015)  .  Stroke (HCC)    "light one"  . Type II diabetes mellitus (HCC)   . Ventral hernia with bowel obstruction     Past Surgical History:  Procedure Laterality Date  . CATARACT EXTRACTION W/ INTRAOCULAR LENS  IMPLANT, BILATERAL    . CHOLECYSTECTOMY OPEN    . COLECTOMY    . ESOPHAGOGASTRODUODENOSCOPY N/A 01/19/2014   Procedure: ESOPHAGOGASTRODUODENOSCOPY (EGD);  Surgeon: Hilarie Fredrickson, MD;  Location: [facility];  Service: Endoscopy;  Laterality: N/A;  . ESOPHAGOGASTRODUODENOSCOPY N/A 01/20/2014   Procedure: ESOPHAGOGASTRODUODENOSCOPY (EGD);  Surgeon: Hilarie Fredrickson, MD;  Location: [facility];  Service: Endoscopy;  Laterality: N/A;  . ESOPHAGOGASTRODUODENOSCOPY N/A 03/19/2014   Procedure: ESOPHAGOGASTRODUODENOSCOPY (EGD);  Surgeon: Shannon Fee, MD;  Location: [facility];  Service: Endoscopy;  Laterality: N/A;  . ESOPHAGOGASTRODUODENOSCOPY N/A 07/08/2015   Procedure: ESOPHAGOGASTRODUODENOSCOPY (EGD);  Surgeon: Sherrilyn Rist, MD;  Location: [facility] ENDOSCOPY;  Service: Endoscopy;  Laterality: N/A;  . ESOPHAGOGASTRODUODENOSCOPY (EGD) WITH PROPOFOL N/A 09/15/2015   Procedure: ESOPHAGOGASTRODUODENOSCOPY (EGD) WITH PROPOFOL;  Surgeon: Shannon Frederick, MD;  Location: [facility] ENDOSCOPY;  Service: Gastroenterology;  Laterality: N/A;  . GASTROJEJUNOSTOMY     hx/notes 01/10/2015  . GASTROJEJUNOSTOMY N/A 09/23/2015   Procedure: OPEN GASTROJEJUNOSTOMY TUBE PLACEMENT;  Surgeon: Shannon Blanch Kinsinger, MD;  Location: [facility] ORS;  Service: General;  Laterality: N/A;  . HERNIA REPAIR  2015  .  IR GENERIC HISTORICAL  01/07/2016   IR GJ TUBE CHANGE 01/07/2016 Shannon Moan, MD [facility]  . LAPAROTOMY N/A 01/20/2015   Procedure: EXPLORATORY LAPAROTOMY;  Surgeon: Shannon Miyamoto, MD;  Location: [facility] OR;  Service: General;  Laterality: N/A;  . LYSIS OF ADHESION N/A 01/20/2015   Procedure: LYSIS OF ADHESIONS < 1 HOUR;  Surgeon: Shannon Miyamoto, MD;  Location: [facility] OR;  Service: General;  Laterality: N/A;  . TUBAL  LIGATION    . VENTRAL HERNIA REPAIR  2015   incarcerated ventral hernia ([facility] 09/2013)/notes 01/10/2015    Current Medications: Outpatient Medications Prior to Visit  Medication Sig Dispense Refill  . acetaminophen (TYLENOL) 325 MG tablet Take 650 mg by mouth 2 (two) times daily.     [name] ADVAIR DISKUS 250-50 MCG/DOSE AEPB INHALE 1 PUFF INTO THE LUNGS 2 (TWO) TIMES DAILY AS NEEDED FOR SHORTNESS OF BREATH  3  . albuterol (PROVENTIL HFA;VENTOLIN HFA) 108 (90 Base) MCG/ACT inhaler Inhale 2 puffs into the lungs every 6 (six) hours as needed for wheezing or shortness of breath. 1 Inhaler 2  . AMINO ACIDS-PROTEIN HYDROLYS PO Take 30 mLs by mouth 2 (two) times daily.    [name] amiodarone (PACERONE) 100 MG tablet Take 100 mg by mouth daily.    [name] amLODipine (NORVASC) 10 MG tablet TAKE 1 TABLET BY MOUTH DAILY FOR HYPERTENSION 30 tablet 3  . apixaban (ELIQUIS) 2.5 MG TABS tablet Take 1 tablet (2.5 mg total) by mouth 2 (two) times daily. 180 tablet 1  . atorvastatin (LIPITOR) 10 MG tablet Take 10 mg by mouth daily.    [name] b complex vitamins tablet Take 1 tablet by mouth daily.     . bisacodyl (DULCOLAX) 10 MG suppository Place 1 suppository (10 mg total) rectally daily as needed for moderate constipation. 12 suppository 0  . calcium-vitamin D (OSCAL WITH D) 500-200 MG-UNIT per tablet Take 1 tablet by mouth daily with breakfast.     . chlorhexidine (PERIDEX) 0.12 % solution Use as directed 15 mLs in the mouth or throat 2 (two) times daily.    . diclofenac sodium (VOLTAREN) 1 % GEL Apply 2 g topically daily as needed (for pain).     . feeding supplement (BOOST / RESOURCE BREEZE) LIQD Take 1 Container by mouth daily.    . fluticasone (FLONASE) 50 MCG/ACT nasal spray Place 2 sprays into both nostrils daily. 16 g 3  . gabapentin (NEURONTIN) 300 MG capsule Take 2 tablets in the morning, Take 1 tablet at noon, Take 2 tablets at night for pain 150 capsule 1  . hydrALAZINE (APRESOLINE) 50 MG tablet Take 1 tablet (50 mg total)  by mouth 3 (three) times daily. 120 tablet 0  . ipratropium-albuterol (DUONEB) 0.5-2.5 (3) MG/3ML SOLN Take 3 mLs by nebulization every 6 (six) hours as needed (sob). Use 3 times daily x 4 days then every 6 hours as needed. 360 mL 4  . isosorbide mononitrate (IMDUR) 30 MG 24 hr tablet TAKE 1 TABLET (30 MG TOTAL) BY MOUTH DAILY.  2  . latanoprost (XALATAN) 0.005 % ophthalmic solution Place 1 drop into both eyes at bedtime. 2.5 mL 12  . levothyroxine (SYNTHROID, LEVOTHROID) 137 MCG tablet Take 1 tablet (137 mcg total) by mouth daily before breakfast. 30 tablet 2  . Maltodextrin-Xanthan Gum (RESOURCE THICKENUP CLEAR) POWD Take 120 g by mouth as needed (use to thicken liquids.). 20 Can 0  . Multiple Vitamins-Minerals (MULTIVITAMIN PO) Take 1 tablet by mouth daily.    . NONFORMULARY  OR COMPOUNDED ITEM Apply 1-2 g topically 4 (four) times daily. Peripheral Neuropathy cream: Bupivacaine 1% Doxepin 3% Gabapentin 6% Ibuprofen 3% Pentoxifylline 3% 120 each 2  . Nutritional Supplements (FEEDING SUPPLEMENT, OSMOLITE 1.5 CAL,) LIQD Place 1,000 mLs into feeding tube daily. 1000 mL 3  . nystatin (NYSTATIN) powder Apply topically daily.    . ondansetron (ZOFRAN) 4 MG tablet Take 1 tablet (4 mg total) by mouth every 8 (eight) hours as needed for nausea or vomiting. 20 tablet 0  . pantoprazole (PROTONIX) 40 MG tablet TAKE 1 TABLET BY MOUTH TWICE A DAY 60 tablet 5  . Polyethyl Glycol-Propyl Glycol 0.4-0.3 % SOLN Place 1 drop into both eyes 4 (four) times daily.     . polyethylene glycol powder (GLYCOLAX/MIRALAX) powder MIX 17GRAMS IN 8 OUNCES OF LIQUID AND DRINK TWICE DAILY AS NEEDED 3162 g 1  . RESTASIS 0.05 % ophthalmic emulsion USE 1 DROP INTO BOTH EYES TWICE DAILY 60 mL 0  . sertraline (ZOLOFT) 100 MG tablet Take one tablet by mouth once daily for depression 90 tablet 1  . simethicone (MYLICON) 80 MG chewable tablet Chew 80 mg by mouth 4 (four) times daily as needed for flatulence.    . sodium chloride 1 g tablet  TAKE 1 TABLET (1 g) BY MOUTH THREE TIMES DAILY 90 tablet 2  . traMADol (ULTRAM) 50 MG tablet Take 50 mg by mouth daily as needed for moderate pain.     . Water For Irrigation, Sterile (FREE WATER) SOLN Place 60 mLs into feeding tube every 6 (six) hours.     No facility-administered medications prior to visit.      Allergies:   Review of patient's allergies indicates no known allergies.   Social History   Social History  . Marital status: Widowed    Spouse name: N/A  . Number of children: 3  . Years of education: N/A   Social History Main Topics  . Smoking status: Never Smoker  . Smokeless tobacco: Former Neurosurgeon    Types: Snuff  . Alcohol use No  . Drug use: No  . Sexual activity: No   Other Topics Concern  . None   Social History Narrative   Married in [place], lives in one story house with 2 people and no pets   Occupation: ?   Has a living Will, [place], and doesn't have DNR   Was living with Husband (in frail health) in [place] until 09/2013.  After her [facility] admission they both came to live with daughter in [place]     Family History:  The patient's family history includes Diabetes in her brother; Hypertension in her mother; Stroke in her mother.   ROS:   Please see the history of present illness.    Review of Systems  Eyes: Positive for visual disturbance.  Cardiovascular: Positive for chest pain, dyspnea on exertion, leg swelling and orthopnea.  Respiratory: Positive for cough, shortness of breath and wheezing.   Musculoskeletal: Positive for back pain, muscle weakness and myalgias.  Gastrointestinal: Positive for abdominal pain, nausea and vomiting.  Neurological: Positive for loss of balance.  Psychiatric/Behavioral: The patient is nervous/anxious.    All other systems reviewed and are negative.   PHYSICAL EXAM:   VS:  BP (!) 162/70   Pulse (!) 115   Ht 5\' 6"  (1.676 m)   Wt 148 lb 6.4 oz (67.3 kg)   SpO2 98%   BMI 23.95 kg/m   Physical Exam  GEN:  Elderely, acutely ill  female, short of breath. HEENT:normal  Neck: increased JVD, carotid bruits, no masses Cardiac:Irreg irreg 2/6 systolic murmur LSB, no rubs, or gallops  Respiratory:  Decreased breath sounds with diffuse wheezing throughout. GI: soft, nontender, nondistended, + BS Ext: plus 1-2 edema bilaterally. without cyanosis, clubbing  Good distal pulses bilaterally MS: no deformity or atrophy  Skin: warm and dry, no rash Neuro:  Alert and Oriented x 3, Strength and sensation are intact Psych: euthymic mood, full affect  Wt Readings from Last 3 Encounters:  01/24/16 148 lb 6.4 oz (67.3 kg)  12/29/15 151 lb 12.8 oz (68.9 kg)  11/25/15 140 lb 6 oz (63.7 kg)      Studies/Labs Reviewed:   EKG:  EKG is ordered today.  The ekg ordered today demonstrates atrial flutter at 113/m  Recent Labs: 08/26/2015: B Natriuretic Peptide 39.0 11/06/2015: ALT 23 11/11/2015: Hemoglobin 8.3; Magnesium 1.8; Platelets 287 12/13/2015: BUN 36; Creatinine 1.3; Potassium 5.1; Sodium 138 12/24/2015: TSH 42.44   Lipid Panel    Component Value Date/Time   CHOL 126 04/07/2015 1015   TRIG 107 04/07/2015 1015   HDL 69 04/07/2015 1015   CHOLHDL 1.8 04/07/2015 1015   CHOLHDL 1.9 12/24/2013 0456   VLDL 26 12/24/2013 0456   LDLCALC 36 04/07/2015 1015    Additional studies/ records that were reviewed today include:     IMPRESSION: 1.  Diffuse multilevel degenerative change.   2.  Bilateral carotid vascular disease .     Electronically Signed   By: Maisie Fus  Register   On: 01/04/2016 08:20   2Decho 07/08/15 Study Conclusions   - Left ventricle: The cavity size was normal. Wall thickness was   increased in a pattern of mild LVH. Systolic function was normal.   The estimated ejection fraction was in the range of 60% to 65%.   Doppler parameters are consistent with abnormal left ventricular   relaxation (grade 1 diastolic dysfunction). - Aortic valve: There was mild regurgitation. - Mitral valve:  There was mild regurgitation. - Left atrium: The atrium was moderately to severely dilated.      ASSESSMENT:    1. Atrial flutter, unspecified type (HCC)   2. Acute on chronic diastolic heart failure (HCC)   3. Asthma with allergic rhinitis with acute exacerbation, unspecified asthma severity, unspecified whether persistent   4. Essential hypertension      PLAN:  In order of problems listed above:  Atrial flutter at 113 bpm. Discussed with Dr. Clifton James who examined patient and recommended hospitalization and treatment with IV amiodarone to help control her heart rate. We will not add diltiazem her beta blocker at this time because of bradycardia in August.  Acute on chronic diastolic heart failure. Has been off her diuretic. Also secondary to atrial flutter.  Asthma with acute exacerbation will need a nebulizer and possible hospitalist to follow  Essential hypertension blood pressure is elevated today    Medication Adjustments/Labs and Tests Ordered: Current medicines are reviewed at length with the patient today.  Concerns regarding medicines are outlined above.  Medication changes, Labs and Tests ordered today are listed in the Patient Instructions below. There are no Patient Instructions on file for this visit.   Elson Clan, PA-C  01/24/2016 3:05 PM    [facility] [address] [place], [place], [place]  [address] Phone: [phone]; Fax: [phone]   I have personally seen and examined this patient with Herma Carson, PA-C. I agree with the assessment and plan as  outlined above. She presents to the office today with c/o dyspnea, LE edema. She is profoundly dyspneic on exam. She is now in atrial flutter with RVR.  My exam shows an elderly female, appears dyspneic, accessory muscle use. WG:NFAOZ, regular. No loud murmurs. Ext: 1+ bilateral LE edema. Will admit to [facility] and plan diuresis with IV lasix. Check BNP, d-dimer. IV amiodarone for rate  control. Chest x-ray today. She has no fever, chills, rigors. Check BMET, CBC.   Verne Carrow 01/24/2016 4:24 PM

## 2016-01-24 NOTE — H&P (Signed)
Cardiology Office Note    Date:  01/24/2016   ID:  [name], DOB 1924-02-28, MRN [id number]  PCP:  Kirt Boys, DO  Cardiologist: Dr. Delton See  No chief complaint on file.   History of Present Illness:  Raewyn Aumick is a 82 y.o. female with a history of paroxysmal atrial fibrillation on amiodarone and Eliquis  as well as a history of hypothyroidism, bradycardia, non-obstructive coronary artery disease, type 2 diabetes, hypertension, cerebro-vascular disease and COPD/ashtma. She was admitted to the hospital in August with bradycardia and weakness. Her labetolol was discontinued and amiodarone was reduced from 200 mg daily to 100 mg daily. She last saw Boyce Medici, PA for post hospital follow-up and was in normal sinus rhythm at 64 bpm.  Patient comes in today extremely short of breath. Her asthma has been acting up and she's been using her inhalers a lot. Yesterday she said her heart rate would jump from 50 bpm to 118 bpm. Same is happening today. She's also developed edema in her lower extremities. She was told she had carotid disease when she had a x-ray of her neck after a fall. Carotid Dopplers are ordered by primary care. She is in rapid atrial flutter and her O2 sats are 87% in the office.     Past Medical History:  Diagnosis Date  . Anemia    previous blood transfusions  . Arthritis    "all over"  . Asthma   . Bradycardia    topped BB and amiodarone decreased- junctional rhythm   . COPD (chronic obstructive pulmonary disease) (HCC)   . Depression    "light case"  . Gastric stenosis   . GERD (gastroesophageal reflux disease)   . History of stomach ulcers   . Hyperlipidemia   . Hypertension   . Hypothyroidism   . Paraesophageal hernia   . Perforated gastric ulcer (HCC)   . Pneumonia   . Renal insufficiency   . SIADH (syndrome of inappropriate ADH production) (HCC)    [name] 01/10/2015  . Small bowel obstruction    "I don't know how many" (01/11/2015)  .  Stroke (HCC)    "light one"  . Type II diabetes mellitus (HCC)   . Ventral hernia with bowel obstruction     Past Surgical History:  Procedure Laterality Date  . CATARACT EXTRACTION W/ INTRAOCULAR LENS  IMPLANT, BILATERAL    . CHOLECYSTECTOMY OPEN    . COLECTOMY    . ESOPHAGOGASTRODUODENOSCOPY N/A 01/19/2014   Procedure: ESOPHAGOGASTRODUODENOSCOPY (EGD);  Surgeon: Hilarie Fredrickson, MD;  Location: [facility];  Service: Endoscopy;  Laterality: N/A;  . ESOPHAGOGASTRODUODENOSCOPY N/A 01/20/2014   Procedure: ESOPHAGOGASTRODUODENOSCOPY (EGD);  Surgeon: Hilarie Fredrickson, MD;  Location: [facility];  Service: Endoscopy;  Laterality: N/A;  . ESOPHAGOGASTRODUODENOSCOPY N/A 03/19/2014   Procedure: ESOPHAGOGASTRODUODENOSCOPY (EGD);  Surgeon: Rachael Fee, MD;  Location: [facility];  Service: Endoscopy;  Laterality: N/A;  . ESOPHAGOGASTRODUODENOSCOPY N/A 07/08/2015   Procedure: ESOPHAGOGASTRODUODENOSCOPY (EGD);  Surgeon: Sherrilyn Rist, MD;  Location: [facility] ENDOSCOPY;  Service: Endoscopy;  Laterality: N/A;  . ESOPHAGOGASTRODUODENOSCOPY (EGD) WITH PROPOFOL N/A 09/15/2015   Procedure: ESOPHAGOGASTRODUODENOSCOPY (EGD) WITH PROPOFOL;  Surgeon: Ruffin Frederick, MD;  Location: [facility] ENDOSCOPY;  Service: Gastroenterology;  Laterality: N/A;  . GASTROJEJUNOSTOMY     hx/notes 01/10/2015  . GASTROJEJUNOSTOMY N/A 09/23/2015   Procedure: OPEN GASTROJEJUNOSTOMY TUBE PLACEMENT;  Surgeon: De Blanch Kinsinger, MD;  Location: [facility] ORS;  Service: General;  Laterality: N/A;  . HERNIA REPAIR  2015  .  IR GENERIC HISTORICAL  01/07/2016   IR GJ TUBE CHANGE 01/07/2016 Malachy Moan, MD [facility]-INTER[facility] RAD  . LAPAROTOMY N/A 01/20/2015   Procedure: EXPLORATORY LAPAROTOMY;  Surgeon: Abigail Miyamoto, MD;  Location: [facility] OR;  Service: General;  Laterality: N/A;  . LYSIS OF ADHESION N/A 01/20/2015   Procedure: LYSIS OF ADHESIONS < 1 HOUR;  Surgeon: Abigail Miyamoto, MD;  Location: [facility] OR;  Service: General;  Laterality: N/A;  . TUBAL  LIGATION    . VENTRAL HERNIA REPAIR  2015   incarcerated ventral hernia ([facility] 09/2013)/notes 01/10/2015    Current Medications: Outpatient Medications Prior to Visit  Medication Sig Dispense Refill  . acetaminophen (TYLENOL) 325 MG tablet Take 650 mg by mouth 2 (two) times daily.     [name] ADVAIR DISKUS 250-50 MCG/DOSE AEPB INHALE 1 PUFF INTO THE LUNGS 2 (TWO) TIMES DAILY AS NEEDED FOR SHORTNESS OF BREATH  3  . albuterol (PROVENTIL HFA;VENTOLIN HFA) 108 (90 Base) MCG/ACT inhaler Inhale 2 puffs into the lungs every 6 (six) hours as needed for wheezing or shortness of breath. 1 Inhaler 2  . AMINO ACIDS-PROTEIN HYDROLYS PO Take 30 mLs by mouth 2 (two) times daily.    [name] amiodarone (PACERONE) 100 MG tablet Take 100 mg by mouth daily.    [name] amLODipine (NORVASC) 10 MG tablet TAKE 1 TABLET BY MOUTH DAILY FOR HYPERTENSION 30 tablet 3  . apixaban (ELIQUIS) 2.5 MG TABS tablet Take 1 tablet (2.5 mg total) by mouth 2 (two) times daily. 180 tablet 1  . atorvastatin (LIPITOR) 10 MG tablet Take 10 mg by mouth daily.    [name] b complex vitamins tablet Take 1 tablet by mouth daily.     . bisacodyl (DULCOLAX) 10 MG suppository Place 1 suppository (10 mg total) rectally daily as needed for moderate constipation. 12 suppository 0  . calcium-vitamin D (OSCAL WITH D) 500-200 MG-UNIT per tablet Take 1 tablet by mouth daily with breakfast.     . chlorhexidine (PERIDEX) 0.12 % solution Use as directed 15 mLs in the mouth or throat 2 (two) times daily.    . diclofenac sodium (VOLTAREN) 1 % GEL Apply 2 g topically daily as needed (for pain).     . feeding supplement (BOOST / RESOURCE BREEZE) LIQD Take 1 Container by mouth daily.    . fluticasone (FLONASE) 50 MCG/ACT nasal spray Place 2 sprays into both nostrils daily. 16 g 3  . gabapentin (NEURONTIN) 300 MG capsule Take 2 tablets in the morning, Take 1 tablet at noon, Take 2 tablets at night for pain 150 capsule 1  . hydrALAZINE (APRESOLINE) 50 MG tablet Take 1 tablet (50 mg total)  by mouth 3 (three) times daily. 120 tablet 0  . ipratropium-albuterol (DUONEB) 0.5-2.5 (3) MG/3ML SOLN Take 3 mLs by nebulization every 6 (six) hours as needed (sob). Use 3 times daily x 4 days then every 6 hours as needed. 360 mL 4  . isosorbide mononitrate (IMDUR) 30 MG 24 hr tablet TAKE 1 TABLET (30 MG TOTAL) BY MOUTH DAILY.  2  . latanoprost (XALATAN) 0.005 % ophthalmic solution Place 1 drop into both eyes at bedtime. 2.5 mL 12  . levothyroxine (SYNTHROID, LEVOTHROID) 137 MCG tablet Take 1 tablet (137 mcg total) by mouth daily before breakfast. 30 tablet 2  . Maltodextrin-Xanthan Gum (RESOURCE THICKENUP CLEAR) POWD Take 120 g by mouth as needed (use to thicken liquids.). 20 Can 0  . Multiple Vitamins-Minerals (MULTIVITAMIN PO) Take 1 tablet by mouth daily.    . NONFORMULARY  OR COMPOUNDED ITEM Apply 1-2 g topically 4 (four) times daily. Peripheral Neuropathy cream: Bupivacaine 1% Doxepin 3% Gabapentin 6% Ibuprofen 3% Pentoxifylline 3% 120 each 2  . Nutritional Supplements (FEEDING SUPPLEMENT, OSMOLITE 1.5 CAL,) LIQD Place 1,000 mLs into feeding tube daily. 1000 mL 3  . nystatin (NYSTATIN) powder Apply topically daily.    . ondansetron (ZOFRAN) 4 MG tablet Take 1 tablet (4 mg total) by mouth every 8 (eight) hours as needed for nausea or vomiting. 20 tablet 0  . pantoprazole (PROTONIX) 40 MG tablet TAKE 1 TABLET BY MOUTH TWICE A DAY 60 tablet 5  . Polyethyl Glycol-Propyl Glycol 0.4-0.3 % SOLN Place 1 drop into both eyes 4 (four) times daily.     . polyethylene glycol powder (GLYCOLAX/MIRALAX) powder MIX 17GRAMS IN 8 OUNCES OF LIQUID AND DRINK TWICE DAILY AS NEEDED 3162 g 1  . RESTASIS 0.05 % ophthalmic emulsion USE 1 DROP INTO BOTH EYES TWICE DAILY 60 mL 0  . sertraline (ZOLOFT) 100 MG tablet Take one tablet by mouth once daily for depression 90 tablet 1  . simethicone (MYLICON) 80 MG chewable tablet Chew 80 mg by mouth 4 (four) times daily as needed for flatulence.    . sodium chloride 1 g tablet  TAKE 1 TABLET (1 g) BY MOUTH THREE TIMES DAILY 90 tablet 2  . traMADol (ULTRAM) 50 MG tablet Take 50 mg by mouth daily as needed for moderate pain.     . Water For Irrigation, Sterile (FREE WATER) SOLN Place 60 mLs into feeding tube every 6 (six) hours.     No facility-administered medications prior to visit.      Allergies:   Review of patient's allergies indicates no known allergies.   Social History   Social History  . Marital status: Widowed    Spouse name: N/A  . Number of children: 3  . Years of education: N/A   Social History Main Topics  . Smoking status: Never Smoker  . Smokeless tobacco: Former Neurosurgeon    Types: Snuff  . Alcohol use No  . Drug use: No  . Sexual activity: No   Other Topics Concern  . None   Social History Narrative   Married in [place], lives in one story house with 2 people and no pets   Occupation: ?   Has a living Will, [place], and doesn't have DNR   Was living with Husband (in frail health) in [place] until 09/2013.  After her [facility] admission they both came to live with daughter in [place]     Family History:  The patient's family history includes Diabetes in her brother; Hypertension in her mother; Stroke in her mother.   ROS:   Please see the history of present illness.    Review of Systems  Eyes: Positive for visual disturbance.  Cardiovascular: Positive for chest pain, dyspnea on exertion, leg swelling and orthopnea.  Respiratory: Positive for cough, shortness of breath and wheezing.   Musculoskeletal: Positive for back pain, muscle weakness and myalgias.  Gastrointestinal: Positive for abdominal pain, nausea and vomiting.  Neurological: Positive for loss of balance.  Psychiatric/Behavioral: The patient is nervous/anxious.    All other systems reviewed and are negative.   PHYSICAL EXAM:   VS:  BP (!) 162/70   Pulse (!) 115   Ht 5\' 6"  (1.676 m)   Wt 148 lb 6.4 oz (67.3 kg)   SpO2 98%   BMI 23.95 kg/m   Physical Exam  GEN:  Elderely, acutely ill  female, short of breath. HEENT:normal  Neck: increased JVD, carotid bruits, no masses Cardiac:Irreg irreg 2/6 systolic murmur LSB, no rubs, or gallops  Respiratory:  Decreased breath sounds with diffuse wheezing throughout. GI: soft, nontender, nondistended, + BS Ext: plus 1-2 edema bilaterally. without cyanosis, clubbing  Good distal pulses bilaterally MS: no deformity or atrophy  Skin: warm and dry, no rash Neuro:  Alert and Oriented x 3, Strength and sensation are intact Psych: euthymic mood, full affect  Wt Readings from Last 3 Encounters:  01/24/16 148 lb 6.4 oz (67.3 kg)  12/29/15 151 lb 12.8 oz (68.9 kg)  11/25/15 140 lb 6 oz (63.7 kg)      Studies/Labs Reviewed:   EKG:  EKG is ordered today.  The ekg ordered today demonstrates atrial flutter at 113/m  Recent Labs: 08/26/2015: B Natriuretic Peptide 39.0 11/06/2015: ALT 23 11/11/2015: Hemoglobin 8.3; Magnesium 1.8; Platelets 287 12/13/2015: BUN 36; Creatinine 1.3; Potassium 5.1; Sodium 138 12/24/2015: TSH 42.44   Lipid Panel    Component Value Date/Time   CHOL 126 04/07/2015 1015   TRIG 107 04/07/2015 1015   HDL 69 04/07/2015 1015   CHOLHDL 1.8 04/07/2015 1015   CHOLHDL 1.9 12/24/2013 0456   VLDL 26 12/24/2013 0456   LDLCALC 36 04/07/2015 1015    Additional studies/ records that were reviewed today include:     IMPRESSION: 1.  Diffuse multilevel degenerative change.   2.  Bilateral carotid vascular disease .     Electronically Signed   By: Maisie Fus  Register   On: 01/04/2016 08:20   2Decho 07/08/15 Study Conclusions   - Left ventricle: The cavity size was normal. Wall thickness was   increased in a pattern of mild LVH. Systolic function was normal.   The estimated ejection fraction was in the range of 60% to 65%.   Doppler parameters are consistent with abnormal left ventricular   relaxation (grade 1 diastolic dysfunction). - Aortic valve: There was mild regurgitation. - Mitral valve:  There was mild regurgitation. - Left atrium: The atrium was moderately to severely dilated.      ASSESSMENT:    1. Atrial flutter, unspecified type (HCC)   2. Acute on chronic diastolic heart failure (HCC)   3. Asthma with allergic rhinitis with acute exacerbation, unspecified asthma severity, unspecified whether persistent   4. Essential hypertension      PLAN:  In order of problems listed above:  Atrial flutter at 113 bpm. Discussed with Dr. Clifton James who examined patient and recommended hospitalization and treatment with IV amiodarone to help control her heart rate. We will not add diltiazem her beta blocker at this time because of bradycardia in August.  Acute on chronic diastolic heart failure. Has been off her diuretic. Also secondary to atrial flutter.  Asthma with acute exacerbation will need a nebulizer and possible hospitalist to follow  Essential hypertension blood pressure is elevated today    Medication Adjustments/Labs and Tests Ordered: Current medicines are reviewed at length with the patient today.  Concerns regarding medicines are outlined above.  Medication changes, Labs and Tests ordered today are listed in the Patient Instructions below. There are no Patient Instructions on file for this visit.   Elson Clan, PA-C  01/24/2016 3:05 PM    [facility] [address] [place], [place], [place]  [address] Phone: [phone]; Fax: [phone]   I have personally seen and examined this patient with Herma Carson, PA-C. I agree with the assessment and plan as  outlined above. She presents to the office today with c/o dyspnea, LE edema. She is profoundly dyspneic on exam. She is now in atrial flutter with RVR.  My exam shows an elderly female, appears dyspneic, accessory muscle use. WG:NFAOZ, regular. No loud murmurs. Ext: 1+ bilateral LE edema. Will admit to [facility] and plan diuresis with IV lasix. Check BNP, d-dimer. IV amiodarone for rate  control. Chest x-ray today. She has no fever, chills, rigors. Check BMET, CBC.   Verne Carrow 01/24/2016 4:24 PM

## 2016-01-24 NOTE — Progress Notes (Addendum)
Called to see patient urgently due to worsening dyspnea. H/P reviewed. I also spoke with office APP. Upon arrival to 3w, the patient was tachypneic with accessory muscle use and extreme shortness of breath. Dr. Martinique in to see as well. She remains in atrial flutter with fixed rate of 115. RT came to assist with placing patient on BIPAP. 80mg  IV Lasix given with quick UOP of 900cc in the first 10 minutes with gradual improvement in dyspnea. Stat neb ordered as well given wheezing. Stat CXR ordered - was told this was delayed due to another stat CXR. Mucomyst nebs ordered inadvertantly by admitting team, will d/c. Will continue the duonebs as ordered TID along with lower dose amiodarone as planned. Office APP/MD chose 30mg /hr due to recent issues with bradycardia. Check stat labs. Follow strict I&O and daily weights. Keep NPO while on BIPAP. Given good UOP on 80mg  IV Lasix, will shift next dose of 40mg  to tomorrow AM. Otherwise continue regimen as ordered by admitting team. Code status reviewed with the patient and her daughter - per our discussions, in the event of a cardiac or respiratory arrest, the patient does not want a code blue called, CPR, intubation, defibrillation, or cardioversion but does agree to ACLS medications as well as use of noninvasive breathing assistance I.e. BIPAP/NIPPV. Nursing also reports pus around [place] tube which is in place for h/o GOO. She also has h/o aspiration PNA listed in her chart. Will ask internal medicine to consult regarding her asthma and above issues. Will also sign out to on-call APP to be aware of patient's status and pending labwork. Melina Copa PA-C  Patient seen and examined and history reviewed. Agree with above findings and plan. Patient admitted from office with atrial flutter and respiratory failure. On arrival to room, condition deteriorated with increased work of breathing, altered mental status. She was in [place] with rate 115. Patient given breathing  treatment. Stat portable CXR ordered. IV started and patient given 80 mg IV lasix. Foley catheter placed and patient placed on Bipap. Breathing improved with above measures. Code status discussed and reviewed with patient and daughter as noted above.  The patient is critically ill with multiple organ systems failure and requires high complexity decision making for assessment and support, frequent evaluation and titration of therapies, application of advanced monitoring technologies and extensive interpretation of multiple databases.  Time of face to face treatment : 1 hour.   Myrah Strawderman Martinique, Chauncey 01/24/2016 5:14 PM

## 2016-01-24 NOTE — Significant Event (Signed)
Rapid Response Event Note  Overview: Time Called: 1552 Arrival Time: 7092 Event Type: Respiratory  Initial Focused Assessment: Patient arrived as direct admit from MD office Upon arrival patient in acute respiratory distress, using accessory muscles Lung sounds with crackles in bases BP 142/84  Afl 115  RR 19 O2 sat 100%  Interventions: RN started IV 80mg  lasix given IV Bipap started by RT Foley placed MD clarified code status with patient and daughter Partial code blue: Medications and Bipap Patient more comfortable on bipap  Labs done [facility] done  Plan of Care (if not transferred): RN to call if assistance needed  Event Summary: Name of Physician Notified: Dr Martinique and Hinton Dyer PA at bedside at      at    Outcome: Stayed in room and stabalized  Event End Time: [place]  Raliegh Ip

## 2016-01-24 NOTE — Consult Note (Signed)
Medical Consultation   Shannon [name]  ZOX:[id number]  DOB: May 25, 1923  DOA: 01/24/2016  PCP: Kirt Boys, DO   Outpatient Specialists:    Requesting physician:   Reason for consultation: wheezing  History of Present Illness: Shannon [name] is an 82 y.o. female  With PMHx of a fib, asthma, hypothyroid who was directly admitted from the cardiology office for shortness of breath.  She was found to be in a flutter- rapid.  Her O2 sats were 87% in the office on room air.  Patient has h/o asthma and has been using her inhalers without relief for the last few days.   She has increased LE edema as well.   When patient arrived in the hospital, she was tachypneic using accessory muscles and having increased shortness of breath.  She was started on IV amio, placed on Bipap and given a dose of IV lasix with good UOP.  Hospitalist were consulted to help with wheezing/asthma exacerbation and possible infected G tube.    Patient has has 5 hospitalizations in the last 6 months.  She was diagnosed with gastric outlet obstruction.  She had GJ tube placed by general surgery on 6/22.  IR changed the tube on 10/6 and stated: the tip of the J arm may again migrate into the fundus of the stomach. If this happens, replacement should be either with a modified 22 French gastrojejunostomy tube which has been cut shorter (approximately 10-15 cm) or a shorter direct percutaneous jejunostomy tube given that the family really uses the gastrostomy portion.    Review of Systems:  ROS -patient acutely dyspneic so unable to do full ROS   Past Medical History: Past Medical History:  Diagnosis Date  . Anemia    previous blood transfusions  . Arthritis    "all over"  . Asthma   . Bradycardia    topped BB and amiodarone decreased- junctional rhythm   . COPD (chronic obstructive pulmonary disease) (HCC)   . Depression    "light case"  . Gastric stenosis   . GERD (gastroesophageal reflux disease)     . History of stomach ulcers   . Hyperlipidemia   . Hypertension   . Hypothyroidism   . Paraesophageal hernia   . Perforated gastric ulcer (HCC)   . Pneumonia   . Renal insufficiency   . SIADH (syndrome of inappropriate ADH production) (HCC)    [name] 01/10/2015  . Small bowel obstruction    "I don't know how many" (01/11/2015)  . Stroke (HCC)    "light one"  . Type II diabetes mellitus (HCC)   . Ventral hernia with bowel obstruction     Past Surgical History: Past Surgical History:  Procedure Laterality Date  . CATARACT EXTRACTION W/ INTRAOCULAR LENS  IMPLANT, BILATERAL    . CHOLECYSTECTOMY OPEN    . COLECTOMY    . ESOPHAGOGASTRODUODENOSCOPY N/A 01/19/2014   Procedure: ESOPHAGOGASTRODUODENOSCOPY (EGD);  Surgeon: Hilarie Fredrickson, MD;  Location: [facility];  Service: Endoscopy;  Laterality: N/A;  . ESOPHAGOGASTRODUODENOSCOPY N/A 01/20/2014   Procedure: ESOPHAGOGASTRODUODENOSCOPY (EGD);  Surgeon: Hilarie Fredrickson, MD;  Location: [facility];  Service: Endoscopy;  Laterality: N/A;  . ESOPHAGOGASTRODUODENOSCOPY N/A 03/19/2014   Procedure: ESOPHAGOGASTRODUODENOSCOPY (EGD);  Surgeon: Rachael Fee, MD;  Location: [facility];  Service: Endoscopy;  Laterality: N/A;  . ESOPHAGOGASTRODUODENOSCOPY N/A 07/08/2015   Procedure: ESOPHAGOGASTRODUODENOSCOPY (EGD);  Surgeon: Sherrilyn Rist, MD;  Location: [facility] ENDOSCOPY;  Service: Endoscopy;  Laterality: N/A;  . ESOPHAGOGASTRODUODENOSCOPY (EGD) WITH PROPOFOL N/A 09/15/2015   Procedure: ESOPHAGOGASTRODUODENOSCOPY (EGD) WITH PROPOFOL;  Surgeon: Ruffin Frederick, MD;  Location: [facility] ENDOSCOPY;  Service: Gastroenterology;  Laterality: N/A;  . GASTROJEJUNOSTOMY     hx/notes 01/10/2015  . GASTROJEJUNOSTOMY N/A 09/23/2015   Procedure: OPEN GASTROJEJUNOSTOMY TUBE PLACEMENT;  Surgeon: De Blanch Kinsinger, MD;  Location: [facility] ORS;  Service: General;  Laterality: N/A;  . HERNIA REPAIR  2015  . IR GENERIC HISTORICAL  01/07/2016   IR GJ TUBE CHANGE 01/07/2016  Malachy Moan, MD [facility]-INTERV RAD  . LAPAROTOMY N/A 01/20/2015   Procedure: EXPLORATORY LAPAROTOMY;  Surgeon: Abigail Miyamoto, MD;  Location: [facility] OR;  Service: General;  Laterality: N/A;  . LYSIS OF ADHESION N/A 01/20/2015   Procedure: LYSIS OF ADHESIONS < 1 HOUR;  Surgeon: Abigail Miyamoto, MD;  Location: [facility] OR;  Service: General;  Laterality: N/A;  . TUBAL LIGATION    . VENTRAL HERNIA REPAIR  2015   incarcerated ventral hernia ([facility] 09/2013)/notes 01/10/2015     Allergies:  No Known Allergies   Social History:  reports that she has never smoked. She has quit using smokeless tobacco. Her smokeless tobacco use included Snuff. She reports that she does not drink alcohol or use drugs.   Family History: Family History  Problem Relation Age of Onset  . Stroke Mother   . Hypertension Mother   . Diabetes Brother   . Heart attack Neg Hx        Physical Exam: Vitals:   01/24/16 1544 01/24/16 1620 01/24/16 1656  BP: (!) 142/84    Pulse: (!) 115 (!) 113   Resp:  19   Temp: 98.6 F (37 C)    TempSrc: Axillary    SpO2: 100% 100% 100%  Weight: 68 kg (149 lb 14.4 oz)    Height: 5\' 6"  (1.676 m)      Constitutional: increased work of breathing mild.  dyspnea with speaking-- unable to complete sentences Eyes: PERLA, EOMI, irises appear normal, anicteric sclera,             Lips appears normal, oropharynx mucosa, tongue, posterior pharynx appear normal  Neck: neck appears normal, no masses, normal ROM, no thyromegaly  CVS: irr and fast-- + LE edema, normal pedal pulses  Respiratory:  diminished but no wheezing  Abdomen: +BS, g tube in place in LUQ-- small appearance of herniated mucosa with purulent discharge surrounding  Musculoskeletal: : no cyanosis, clubbing or edema noted bilaterally Neuro: Cranial nerves II-XII intact, strength, sensation, reflexes Psych: judgement and insight appear normal, stable mood and affect, mental status Skin: no rashes or lesions or ulcers, no  induration or nodules     Data reviewed:  I have personally reviewed following labs and imaging studies Labs:  CBC:  Recent Labs Lab 01/24/16 1633  WBC 6.4  NEUTROABS 3.8  HGB 9.7*  HCT 28.9*  MCV 86.8  PLT 370    Basic Metabolic Panel: No results for input(s): NA, K, CL, CO2, GLUCOSE, BUN, CREATININE, CALCIUM, MG, PHOS in the last 168 hours. GFR CrCl cannot be calculated (Patient's most recent lab result is older than the maximum 21 days allowed.). Liver Function Tests: No results for input(s): AST, ALT, ALKPHOS, BILITOT, PROT, ALBUMIN in the last 168 hours. No results for input(s): LIPASE, AMYLASE in the last 168 hours. No results for input(s): AMMONIA in the last 168 hours. Coagulation profile No results for input(s): INR, PROTIME in the last 168 hours.  Cardiac  Enzymes: No results for input(s): CKTOTAL, CKMB, CKMBINDEX, TROPONINI in the last 168 hours. BNP: Invalid input(s): POCBNP CBG: No results for input(s): GLUCAP in the last 168 hours. D-Dimer No results for input(s): DDIMER in the last 72 hours. Hgb A1c No results for input(s): HGBA1C in the last 72 hours. Lipid Profile No results for input(s): CHOL, HDL, LDLCALC, TRIG, CHOLHDL, LDLDIRECT in the last 72 hours. Thyroid function studies No results for input(s): TSH, T4TOTAL, T3FREE, THYROIDAB in the last 72 hours.  Invalid input(s): FREET3 Anemia work up No results for input(s): VITAMINB12, FOLATE, FERRITIN, TIBC, IRON, RETICCTPCT in the last 72 hours. Urinalysis    Component Value Date/Time   COLORURINE YELLOW 11/06/2015 1830   APPEARANCEUR CLEAR 11/06/2015 1830   LABSPEC 1.012 11/06/2015 1830   PHURINE 8.0 11/06/2015 1830   GLUCOSEU NEGATIVE 11/06/2015 1830   HGBUR NEGATIVE 11/06/2015 1830   BILIRUBINUR NEGATIVE 11/06/2015 1830   KETONESUR NEGATIVE 11/06/2015 1830   PROTEINUR NEGATIVE 11/06/2015 1830   UROBILINOGEN 0.2 01/27/2015 2308   NITRITE NEGATIVE 11/06/2015 1830   LEUKOCYTESUR NEGATIVE  11/06/2015 1830     Sepsis Labs Invalid input(s): PROCALCITONIN,  WBC,  LACTICIDVEN Microbiology No results found for this or any previous visit (from the past 240 hour(s)).     Inpatient Medications:   Scheduled Meds: . acetaminophen  650 mg Oral BID  . amLODipine  10 mg Oral Daily  . apixaban  2.5 mg Oral BID  . atorvastatin  10 mg Oral Daily  . b complex vitamins  1 tablet Oral Daily  . [START ON 01/25/2016] calcium-vitamin D  1 tablet Oral Q breakfast  . chlorhexidine  15 mL Mouth/Throat BID  . cycloSPORINE  1 drop Both Eyes BID  . feeding supplement  1 Container Oral Daily  . feeding supplement (OSMOLITE 1.5 CAL)  1,000 mL Per Tube Q24H  . fluticasone  2 spray Each Nare Daily  . free water  60 mL Per Tube Q6H  . [START ON 01/25/2016] furosemide  40 mg Intravenous BID  . gabapentin  300 mg Oral TID  . hydrALAZINE  50 mg Oral TID  . ipratropium-albuterol  3 mL Nebulization Q6H  . isosorbide mononitrate  30 mg Oral Daily  . latanoprost  1 drop Both Eyes QHS  . [START ON 01/25/2016] levothyroxine  137 mcg Oral QAC breakfast  . methylPREDNISolone (SOLU-MEDROL) injection  40 mg Intravenous Q6H  . mometasone-formoterol  2 puff Inhalation BID  . pantoprazole  40 mg Oral BID  . Polyethyl Glycol-Propyl Glycol  1 drop Both Eyes QID  . polyethylene glycol powder  1 Container Oral BID  . sertraline  100 mg Oral QHS  . sodium chloride flush  3 mL Intravenous Q12H   Continuous Infusions: . amiodarone 30 mg/hr (01/24/16 1711)     Radiological Exams on Admission: Dg Chest Port 1 View  Result Date: 01/24/2016 CLINICAL DATA:  Shortness of breath. EXAM: PORTABLE CHEST 1 VIEW COMPARISON:  11/09/2015. FINDINGS: Trachea is midline. Heart size stable. Thoracic aorta is calcified. Lungs are clear. No pleural fluid. Right hemidiaphragm is elevated. Degenerative changes are seen in the glenohumeral joints bilaterally. IMPRESSION: No acute findings. Electronically Signed   By: Leanna Battles M.D.   On: 01/24/2016 17:13    Impression/Recommendations Active Problems:   Gastric outlet obstruction   Asthma with allergic rhinitis   Acute on chronic diastolic heart failure (HCC)   Hypothyroidism due to medication   Atrial flutter (HCC)   Infection   A  flutter uncontrolled leading to acute on chronic diastolic HF -rate in low 110s. -on amiodarone -per primary -IV lasix given with good UOP  Gastric outlet obstruction with recent g tube placement at [facility] -purulent material around tube and ? Hernia?-- patient just had tube replaced by IR on 10/5-- initially placed by surgery in June? -consult IR  Asthma exacerbation -patient was wheezing but resolved with IV lasix (suspect related to volume overload and atrial flutter) -patient did c/o chest tightness and cough-- will do few doses of IV steroids  -nebs scheduled and PRN  Hypothyroidism -continue home meds   5th hospitalization in 6 months, ? If palliative referral is needed  Thank you for this consultation.  Our [facility] hospitalist team will follow the patient with you.   Time Spent:   Kyriakos Babler U Isaak Delmundo DO. [facility] 01/24/2016, 5:35 PM

## 2016-01-25 ENCOUNTER — Observation Stay (HOSPITAL_COMMUNITY): Payer: Medicare Other

## 2016-01-25 DIAGNOSIS — I4892 Unspecified atrial flutter: Secondary | ICD-10-CM | POA: Diagnosis not present

## 2016-01-25 DIAGNOSIS — Z8711 Personal history of peptic ulcer disease: Secondary | ICD-10-CM | POA: Diagnosis not present

## 2016-01-25 DIAGNOSIS — E873 Alkalosis: Secondary | ICD-10-CM

## 2016-01-25 DIAGNOSIS — J45901 Unspecified asthma with (acute) exacerbation: Secondary | ICD-10-CM | POA: Diagnosis not present

## 2016-01-25 DIAGNOSIS — R0602 Shortness of breath: Secondary | ICD-10-CM | POA: Diagnosis not present

## 2016-01-25 DIAGNOSIS — K311 Adult hypertrophic pyloric stenosis: Secondary | ICD-10-CM | POA: Diagnosis not present

## 2016-01-25 DIAGNOSIS — J3489 Other specified disorders of nose and nasal sinuses: Secondary | ICD-10-CM | POA: Diagnosis not present

## 2016-01-25 DIAGNOSIS — J449 Chronic obstructive pulmonary disease, unspecified: Secondary | ICD-10-CM | POA: Diagnosis not present

## 2016-01-25 DIAGNOSIS — J9601 Acute respiratory failure with hypoxia: Secondary | ICD-10-CM

## 2016-01-25 DIAGNOSIS — J302 Other seasonal allergic rhinitis: Secondary | ICD-10-CM | POA: Diagnosis not present

## 2016-01-25 DIAGNOSIS — Z8673 Personal history of transient ischemic attack (TIA), and cerebral infarction without residual deficits: Secondary | ICD-10-CM | POA: Diagnosis not present

## 2016-01-25 DIAGNOSIS — I82432 Acute embolism and thrombosis of left popliteal vein: Secondary | ICD-10-CM | POA: Diagnosis not present

## 2016-01-25 DIAGNOSIS — M79609 Pain in unspecified limb: Secondary | ICD-10-CM

## 2016-01-25 DIAGNOSIS — I5033 Acute on chronic diastolic (congestive) heart failure: Secondary | ICD-10-CM

## 2016-01-25 DIAGNOSIS — I130 Hypertensive heart and chronic kidney disease with heart failure and stage 1 through stage 4 chronic kidney disease, or unspecified chronic kidney disease: Secondary | ICD-10-CM | POA: Diagnosis not present

## 2016-01-25 DIAGNOSIS — J9621 Acute and chronic respiratory failure with hypoxia: Secondary | ICD-10-CM | POA: Diagnosis not present

## 2016-01-25 DIAGNOSIS — Z8249 Family history of ischemic heart disease and other diseases of the circulatory system: Secondary | ICD-10-CM | POA: Diagnosis not present

## 2016-01-25 DIAGNOSIS — I480 Paroxysmal atrial fibrillation: Secondary | ICD-10-CM | POA: Diagnosis not present

## 2016-01-25 DIAGNOSIS — N289 Disorder of kidney and ureter, unspecified: Secondary | ICD-10-CM | POA: Diagnosis not present

## 2016-01-25 DIAGNOSIS — I2699 Other pulmonary embolism without acute cor pulmonale: Secondary | ICD-10-CM | POA: Diagnosis not present

## 2016-01-25 DIAGNOSIS — E039 Hypothyroidism, unspecified: Secondary | ICD-10-CM

## 2016-01-25 DIAGNOSIS — Y833 Surgical operation with formation of external stoma as the cause of abnormal reaction of the patient, or of later complication, without mention of misadventure at the time of the procedure: Secondary | ICD-10-CM | POA: Diagnosis present

## 2016-01-25 DIAGNOSIS — K9423 Gastrostomy malfunction: Secondary | ICD-10-CM | POA: Diagnosis not present

## 2016-01-25 DIAGNOSIS — Z9851 Tubal ligation status: Secondary | ICD-10-CM | POA: Diagnosis not present

## 2016-01-25 DIAGNOSIS — I82442 Acute embolism and thrombosis of left tibial vein: Secondary | ICD-10-CM | POA: Diagnosis not present

## 2016-01-25 DIAGNOSIS — J9600 Acute respiratory failure, unspecified whether with hypoxia or hypercapnia: Secondary | ICD-10-CM | POA: Diagnosis not present

## 2016-01-25 DIAGNOSIS — K219 Gastro-esophageal reflux disease without esophagitis: Secondary | ICD-10-CM | POA: Diagnosis not present

## 2016-01-25 DIAGNOSIS — I82402 Acute embolism and thrombosis of unspecified deep veins of left lower extremity: Secondary | ICD-10-CM | POA: Diagnosis not present

## 2016-01-25 DIAGNOSIS — Z9049 Acquired absence of other specified parts of digestive tract: Secondary | ICD-10-CM | POA: Diagnosis not present

## 2016-01-25 DIAGNOSIS — Z4682 Encounter for fitting and adjustment of non-vascular catheter: Secondary | ICD-10-CM | POA: Diagnosis not present

## 2016-01-25 DIAGNOSIS — I2510 Atherosclerotic heart disease of native coronary artery without angina pectoris: Secondary | ICD-10-CM | POA: Diagnosis present

## 2016-01-25 DIAGNOSIS — Z09 Encounter for follow-up examination after completed treatment for conditions other than malignant neoplasm: Secondary | ICD-10-CM

## 2016-01-25 DIAGNOSIS — Z79899 Other long term (current) drug therapy: Secondary | ICD-10-CM | POA: Diagnosis not present

## 2016-01-25 DIAGNOSIS — Z9889 Other specified postprocedural states: Secondary | ICD-10-CM | POA: Diagnosis not present

## 2016-01-25 DIAGNOSIS — J4541 Moderate persistent asthma with (acute) exacerbation: Secondary | ICD-10-CM

## 2016-01-25 DIAGNOSIS — Z833 Family history of diabetes mellitus: Secondary | ICD-10-CM | POA: Diagnosis not present

## 2016-01-25 DIAGNOSIS — J4551 Severe persistent asthma with (acute) exacerbation: Secondary | ICD-10-CM | POA: Diagnosis not present

## 2016-01-25 DIAGNOSIS — R001 Bradycardia, unspecified: Secondary | ICD-10-CM | POA: Diagnosis present

## 2016-01-25 DIAGNOSIS — Z7901 Long term (current) use of anticoagulants: Secondary | ICD-10-CM | POA: Diagnosis not present

## 2016-01-25 DIAGNOSIS — E032 Hypothyroidism due to medicaments and other exogenous substances: Secondary | ICD-10-CM | POA: Diagnosis not present

## 2016-01-25 DIAGNOSIS — Z823 Family history of stroke: Secondary | ICD-10-CM | POA: Diagnosis not present

## 2016-01-25 DIAGNOSIS — I483 Typical atrial flutter: Secondary | ICD-10-CM | POA: Diagnosis not present

## 2016-01-25 DIAGNOSIS — Z931 Gastrostomy status: Secondary | ICD-10-CM | POA: Diagnosis not present

## 2016-01-25 DIAGNOSIS — I82492 Acute embolism and thrombosis of other specified deep vein of left lower extremity: Secondary | ICD-10-CM | POA: Diagnosis not present

## 2016-01-25 DIAGNOSIS — H5712 Ocular pain, left eye: Secondary | ICD-10-CM | POA: Diagnosis not present

## 2016-01-25 LAB — BLOOD GAS, ARTERIAL
Acid-base deficit: 6.2 mmol/L — ABNORMAL HIGH (ref 0.0–2.0)
Acid-base deficit: 4.2 mmol/L — ABNORMAL HIGH (ref 0.0–2.0)
Bicarbonate: 15.3 mmol/L — ABNORMAL LOW (ref 20.0–28.0)
Bicarbonate: 17.4 mmol/L — ABNORMAL LOW (ref 20.0–28.0)
Delivery systems: POSITIVE
Drawn by: 301361
Drawn by: 30136
Expiratory PAP: 5
Expiratory PAP: 5
FIO2: 40
FIO2: 100
Inspiratory PAP: 10
Inspiratory PAP: 10
Mode: POSITIVE
O2 Saturation: 99.6 %
O2 Saturation: 100 %
Patient temperature: 97.4
Patient temperature: 97.4
RATE: 15 resp/min
RATE: 15 resp/min
pH, Arterial: 7.622 (ref 7.350–7.450)
pH, Arterial: 7.61 (ref 7.350–7.450)
pO2, Arterial: 188 mmHg — ABNORMAL HIGH (ref 83.0–108.0)
pO2, Arterial: 495 mmHg — ABNORMAL HIGH (ref 83.0–108.0)

## 2016-01-25 LAB — CBC
HCT: 28.1 % — ABNORMAL LOW (ref 36.0–46.0)
Hemoglobin: 9.6 g/dL — ABNORMAL LOW (ref 12.0–15.0)
MCH: 29.1 pg (ref 26.0–34.0)
MCHC: 34.2 g/dL (ref 30.0–36.0)
MCV: 85.2 fL (ref 78.0–100.0)
Platelets: 387 10*3/uL (ref 150–400)
RBC: 3.3 MIL/uL — ABNORMAL LOW (ref 3.87–5.11)
RDW: 15.9 % — ABNORMAL HIGH (ref 11.5–15.5)
WBC: 7.1 10*3/uL (ref 4.0–10.5)

## 2016-01-25 LAB — BASIC METABOLIC PANEL
Anion gap: 12 (ref 5–15)
BUN: 49 mg/dL — ABNORMAL HIGH (ref 6–20)
CO2: 22 mmol/L (ref 22–32)
Calcium: 9.7 mg/dL (ref 8.9–10.3)
Chloride: 97 mmol/L — ABNORMAL LOW (ref 101–111)
Creatinine, Ser: 1.82 mg/dL — ABNORMAL HIGH (ref 0.44–1.00)
GFR calc Af Amer: 27 mL/min — ABNORMAL LOW (ref >60)
GFR calc non Af Amer: 23 mL/min — ABNORMAL LOW (ref >60)
Glucose, Bld: 206 mg/dL — ABNORMAL HIGH (ref 65–99)
Potassium: 4.8 mmol/L (ref 3.5–5.1)
Sodium: 131 mmol/L — ABNORMAL LOW (ref 135–145)

## 2016-01-25 LAB — TSH: TSH: 8.794 u[IU]/mL — ABNORMAL HIGH (ref 0.350–4.500)

## 2016-01-25 LAB — T4, FREE: Free T4: 1.46 ng/dL — ABNORMAL HIGH (ref 0.61–1.12)

## 2016-01-25 LAB — GLUCOSE, CAPILLARY
Glucose-Capillary: 241 mg/dL — ABNORMAL HIGH (ref 65–99)
Glucose-Capillary: 227 mg/dL — ABNORMAL HIGH (ref 65–99)
Glucose-Capillary: 203 mg/dL — ABNORMAL HIGH (ref 65–99)
Glucose-Capillary: 185 mg/dL — ABNORMAL HIGH (ref 65–99)

## 2016-01-25 MED ORDER — OSMOLITE 1.5 CAL PO LIQD
1000.0000 mL | ORAL | Status: DC
  Filled 2016-01-25: qty 1000

## 2016-01-25 MED ORDER — INSULIN ASPART 100 UNIT/ML ~~LOC~~ SOLN
0.0000 [IU] | SUBCUTANEOUS | Status: DC
  Administered 2016-01-25: 5 [IU] via SUBCUTANEOUS
  Administered 2016-01-25: 3 [IU] via SUBCUTANEOUS
  Administered 2016-01-26 (×2): 5 [IU] via SUBCUTANEOUS
  Administered 2016-01-26: 3 [IU] via SUBCUTANEOUS
  Administered 2016-01-26: 5 [IU] via SUBCUTANEOUS
  Administered 2016-01-26: 3 [IU] via SUBCUTANEOUS
  Administered 2016-01-26: 5 [IU] via SUBCUTANEOUS
  Administered 2016-01-27: 8 [IU] via SUBCUTANEOUS
  Administered 2016-01-27 (×2): 5 [IU] via SUBCUTANEOUS
  Administered 2016-01-27: 3 [IU] via SUBCUTANEOUS
  Administered 2016-01-27: 5 [IU] via SUBCUTANEOUS
  Administered 2016-01-28 (×5): 3 [IU] via SUBCUTANEOUS
  Administered 2016-01-28: 11 [IU] via SUBCUTANEOUS
  Administered 2016-01-29 (×2): 3 [IU] via SUBCUTANEOUS
  Administered 2016-01-29: 5 [IU] via SUBCUTANEOUS
  Administered 2016-01-29: 8 [IU] via SUBCUTANEOUS
  Administered 2016-01-29: 3 [IU] via SUBCUTANEOUS
  Administered 2016-01-29 – 2016-01-30 (×2): 2 [IU] via SUBCUTANEOUS
  Administered 2016-01-30 (×2): 5 [IU] via SUBCUTANEOUS
  Administered 2016-01-30: 3 [IU] via SUBCUTANEOUS
  Administered 2016-01-30: 8 [IU] via SUBCUTANEOUS
  Administered 2016-01-31: 2 [IU] via SUBCUTANEOUS
  Administered 2016-01-31: 8 [IU] via SUBCUTANEOUS
  Administered 2016-01-31: 5 [IU] via SUBCUTANEOUS
  Administered 2016-01-31 (×2): 3 [IU] via SUBCUTANEOUS
  Administered 2016-01-31 – 2016-02-01 (×2): 2 [IU] via SUBCUTANEOUS
  Administered 2016-02-01: 5 [IU] via SUBCUTANEOUS
  Administered 2016-02-01 (×2): 3 [IU] via SUBCUTANEOUS
  Administered 2016-02-01: 5 [IU] via SUBCUTANEOUS
  Administered 2016-02-02 (×2): 3 [IU] via SUBCUTANEOUS

## 2016-01-25 MED ORDER — FUROSEMIDE 10 MG/ML IJ SOLN
20.0000 mg | Freq: Once | INTRAMUSCULAR | Status: AC
  Administered 2016-01-25: 20 mg via INTRAVENOUS

## 2016-01-25 MED ORDER — B COMPLEX-C PO TABS
1.0000 | ORAL_TABLET | Freq: Every day | ORAL | Status: DC
  Administered 2016-01-25 – 2016-02-02 (×9): 1 via ORAL
  Filled 2016-01-25 (×10): qty 1

## 2016-01-25 MED ORDER — SODIUM CHLORIDE 0.9% FLUSH: 3.0000 mL | INTRAVENOUS | Status: DC | PRN

## 2016-01-25 MED ORDER — SODIUM CHLORIDE 0.9 % IV SOLN: 250.0000 mL | INTRAVENOUS | Status: DC

## 2016-01-25 MED ORDER — SODIUM CHLORIDE 0.9% FLUSH
3.0000 mL | Freq: Two times a day (BID) | INTRAVENOUS | Status: DC
  Administered 2016-01-25 – 2016-02-02 (×7): 3 mL via INTRAVENOUS

## 2016-01-25 MED ORDER — FUROSEMIDE 10 MG/ML IJ SOLN
INTRAMUSCULAR | Status: AC
  Filled 2016-01-25: qty 2

## 2016-01-25 MED ORDER — INSULIN ASPART 100 UNIT/ML ~~LOC~~ SOLN
0.0000 [IU] | Freq: Three times a day (TID) | SUBCUTANEOUS | Status: DC
  Administered 2016-01-25: 3 [IU] via SUBCUTANEOUS

## 2016-01-25 MED ORDER — HEPARIN (PORCINE) IN NACL 100-0.45 UNIT/ML-% IJ SOLN
850.0000 [IU]/h | INTRAMUSCULAR | Status: DC
  Administered 2016-01-25: 1000 [IU]/h via INTRAVENOUS
  Administered 2016-01-27: 900 [IU]/h via INTRAVENOUS
  Administered 2016-01-28 – 2016-01-29 (×2): 1000 [IU]/h via INTRAVENOUS
  Administered 2016-01-30: 950 [IU]/h via INTRAVENOUS
  Administered 2016-02-01: 850 [IU]/h via INTRAVENOUS
  Filled 2016-01-25 (×2): qty 250
  Filled 2016-01-25: qty 2500
  Filled 2016-01-25 (×4): qty 250

## 2016-01-25 MED ORDER — TRAMADOL HCL 50 MG PO TABS
50.0000 mg | ORAL_TABLET | Freq: Four times a day (QID) | ORAL | Status: DC | PRN
  Administered 2016-01-25 – 2016-01-27 (×2): 50 mg via ORAL
  Filled 2016-01-25 (×2): qty 1

## 2016-01-25 MED ORDER — OSMOLITE 1.5 CAL PO LIQD
1000.0000 mL | ORAL | Status: DC
  Administered 2016-01-25 – 2016-01-31 (×7): 1000 mL
  Filled 2016-01-25 (×10): qty 1000

## 2016-01-25 MED ORDER — METHYLPREDNISOLONE SODIUM SUCC 40 MG IJ SOLR
40.0000 mg | Freq: Two times a day (BID) | INTRAMUSCULAR | Status: DC
  Administered 2016-01-25 – 2016-01-26 (×2): 40 mg via INTRAVENOUS
  Filled 2016-01-25 (×2): qty 1

## 2016-01-25 NOTE — Consult Note (Signed)
Name: Shannon [name] MRN: [id number] DOB: 01-Aug-1923    ADMISSION DATE:  01/24/2016 CONSULTATION DATE:  01/24/16  REFERRING MD :  Dr. Delton See   CHIEF COMPLAINT:  Respiratory Failure    HISTORY OF PRESENT ILLNESS:  82 y/o F, never smoker (chewed tobacco) with PMH of anemia, arthritis, depression, GERD, gastric stenosis, PUD, ventral hernia s/p repair, colectomy,  hypothyroidism, DM II, CAD, HTN, HLD, PAF (on amiodarone / Eliquis), bradycardia, CVA, & asthma / COPD who presented to [facility] on 10/23 with reports of shortness of breath.    The patient was admitted to the hospital in August for bradycardia & weakness.  She was taken off her labetalol and amiodarone was reduced during that admit.    The patient reports she felt as though her asthma had been "acting up" with worsening shortness of breath despite increased inhaler use.  She reported variable heart rate - HR in the 50's then would increase to 110's.  She also had noticed progressive swelling in her lower extremities.  She was seen in the Cardiology office on 10/23 and found to be in rapid atrial flutter with O2 sats of 87%.  She was directly admitted from the Cardiology office for rate control.  The patient was started on IV amiodarone.  She initially improved but afternoon 10/24, she developed increased SOB / work of breathing and was placed on bipap.  ABG assessment demonstrated ph 7.622, undetectable CO2, and bicarbonate of 15.3.    PCCM consulted for evaluation of alkalosis.    PAST MEDICAL HISTORY :   has a past medical history of Anemia; Arthritis; Asthma; Bradycardia; COPD (chronic obstructive pulmonary disease) (HCC); Depression; Gastric stenosis; GERD (gastroesophageal reflux disease); History of stomach ulcers; Hyperlipidemia; Hypertension; Hypothyroidism; Paraesophageal hernia; Perforated gastric ulcer (HCC); Pneumonia; Renal insufficiency; SIADH (syndrome of inappropriate ADH production) (HCC); Small bowel obstruction; Stroke  (HCC); Type II diabetes mellitus (HCC); and Ventral hernia with bowel obstruction.  has a past surgical history that includes Tubal ligation; Colectomy; Hernia repair (2015); Esophagogastroduodenoscopy (N/A, 01/19/2014); Esophagogastroduodenoscopy (N/A, 01/20/2014); Esophagogastroduodenoscopy (N/A, 03/19/2014); Cholecystectomy open; Ventral hernia repair (2015); Cataract extraction w/ intraocular lens  implant, bilateral; Gastrojejunostomy; laparotomy (N/A, 01/20/2015); Lysis of adhesion (N/A, 01/20/2015); Esophagogastroduodenoscopy (N/A, 07/08/2015); Esophagogastroduodenoscopy (egd) with propofol (N/A, 09/15/2015); Gastrojejunostomy (N/A, 09/23/2015); and ir generic historical (01/07/2016).   Prior to Admission medications   Medication Sig Start Date End Date Taking? Authorizing Provider  acetaminophen (TYLENOL) 325 MG tablet Take 650 mg by mouth 2 (two) times daily as needed for mild pain.    Yes Historical Provider, MD  ADVAIR DISKUS 250-50 MCG/DOSE AEPB INHALE 1 PUFF INTO THE LUNGS 2 (TWO) TIMES DAILY AS NEEDED FOR SHORTNESS OF BREATH 12/01/14  Yes Historical Provider, MD  albuterol (PROVENTIL HFA;VENTOLIN HFA) 108 (90 Base) MCG/ACT inhaler Inhale 2 puffs into the lungs every 6 (six) hours as needed for wheezing or shortness of breath. 06/04/15  Yes Monica Carter, DO  AMINO ACIDS-PROTEIN HYDROLYS PO Take 30 mLs by mouth 2 (two) times daily.   Yes Historical Provider, MD  amiodarone (PACERONE) 100 MG tablet Take 100 mg by mouth daily.   Yes Historical Provider, MD  amLODipine (NORVASC) 10 MG tablet TAKE 1 TABLET BY MOUTH DAILY FOR HYPERTENSION Patient taking differently: Take 10 mg by mouth once a day for hypertension 11/29/15  Yes Kirt Boys, DO  apixaban (ELIQUIS) 2.5 MG TABS tablet Take 1 tablet (2.5 mg total) by mouth 2 (two) times daily. 08/11/15  Yes Kirt Boys, DO  atorvastatin (LIPITOR) 10  MG tablet Take 10 mg by mouth daily.   Yes Historical Provider, MD  b complex vitamins tablet Take 1 tablet  by mouth daily.    Yes Historical Provider, MD  bisacodyl (DULCOLAX) 10 MG suppository Place 1 suppository (10 mg total) rectally daily as needed for moderate constipation. 09/30/15  Yes Calvert Cantor, MD  calcium-vitamin D (OSCAL WITH D) 500-200 MG-UNIT per tablet Take 1 tablet by mouth daily with breakfast.    Yes Historical Provider, MD  cetirizine (ZYRTEC) 10 MG tablet Take 10 mg by mouth daily as needed for allergies or rhinitis.   Yes Historical Provider, MD  chlorhexidine (PERIDEX) 0.12 % solution Use as directed 15 mLs in the mouth or throat 2 (two) times daily.   Yes Historical Provider, MD  diclofenac sodium (VOLTAREN) 1 % GEL Apply 2 g topically daily as needed (for pain).    Yes Historical Provider, MD  feeding supplement (BOOST / RESOURCE BREEZE) LIQD Take 1 Container by mouth daily. Patient taking differently: Take 1 Container by mouth 2 (two) times daily between meals.  11/11/15  Yes Rodolph Bong, MD  fluticasone [facility]) 50 MCG/ACT nasal spray Place 2 sprays into both nostrils daily. 08/07/14  Yes Kirt Boys, DO  gabapentin (NEURONTIN) 300 MG capsule Take 2 tablets in the morning, Take 1 tablet at noon, Take 2 tablets at night for pain Patient taking differently: Take 300-600 mg by mouth See admin instructions. 600 mg in the morning then 300 mg at noontime then 600 mg at night (for pain) 11/29/15  Yes Sharon Seller, NP  hydrALAZINE (APRESOLINE) 50 MG tablet Take 1 tablet (50 mg total) by mouth 3 (three) times daily. 11/11/15  Yes Rodolph Bong, MD  ipratropium-albuterol (DUONEB) 0.5-2.5 (3) MG/3ML SOLN Take 3 mLs by nebulization every 6 (six) hours as needed (sob). Use 3 times daily x 4 days then every 6 hours as needed. Patient taking differently: Take 3 mLs by nebulization every 6 (six) hours as needed (for shortness of breath).  11/11/15  Yes Rodolph Bong, MD  isosorbide mononitrate (IMDUR) 30 MG 24 hr tablet TAKE 1 TABLET (30 MG TOTAL) BY MOUTH DAILY. 06/22/15  Yes  Historical Provider, MD  latanoprost (XALATAN) 0.005 % ophthalmic solution Place 1 drop into both eyes at bedtime. 06/02/14  Yes Mahima Glade Lloyd, MD  levothyroxine (SYNTHROID, LEVOTHROID) 137 MCG tablet Take 1 tablet (137 mcg total) by mouth daily before breakfast. 12/27/15  Yes Kirt Boys, DO  Maltodextrin-Xanthan Gum (RESOURCE THICKENUP CLEAR) POWD Take 120 g by mouth as needed (use to thicken liquids.). Patient taking differently: See admin instructions. USED TO THICKEN LIQUIDS 11/11/15  Yes Rodolph Bong, MD  Multiple Vitamins-Minerals (MULTIVITAMIN PO) Take 1 tablet by mouth daily.   Yes Historical Provider, MD  Nutritional Supplements (FEEDING SUPPLEMENT, OSMOLITE 1.5 CAL,) LIQD Place 1,000 mLs into feeding tube daily. Patient taking differently: Place 1,000 mLs into feeding tube daily. (at night for 12 hours) 09/30/15  Yes Calvert Cantor, MD  ondansetron (ZOFRAN) 4 MG tablet Take 1 tablet (4 mg total) by mouth every 8 (eight) hours as needed for nausea or vomiting. 09/07/15  Yes Sharon Seller, NP  pantoprazole (PROTONIX) 40 MG tablet TAKE 1 TABLET BY MOUTH TWICE A DAY Patient taking differently: Take 40 mg by mouth two times a day 08/11/15  Yes Kirt Boys, DO  Polyethyl Glycol-Propyl Glycol 0.4-0.3 % SOLN Place 1 drop into both eyes 4 (four) times daily.    Yes Historical Provider, MD  polyethylene glycol powder (GLYCOLAX/MIRALAX) powder MIX 17GRAMS IN 8 OUNCES OF LIQUID AND DRINK TWICE DAILY AS NEEDED Patient taking differently: Mix 17 grams into 8 ounces of juice and drink one to two times a day as needed 09/13/15  Yes Monica Carter, DO  RESTASIS 0.05 % ophthalmic emulsion USE 1 DROP INTO BOTH EYES TWICE DAILY 11/01/15  Yes Kimber Relic, MD  sertraline (ZOLOFT) 100 MG tablet Take one tablet by mouth once daily for depression Patient taking differently: Take 100 mg by mouth daily. FOR DEPRESSION 09/03/15  Yes Kirt Boys, DO  simethicone (MYLICON) 80 MG chewable tablet Chew 80 mg by mouth  4 (four) times daily as needed for flatulence.   Yes Historical Provider, MD  sodium chloride 1 g tablet TAKE 1 TABLET (1 g) BY MOUTH THREE TIMES DAILY 11/11/15  Yes Rodolph Bong, MD  traMADol (ULTRAM) 50 MG tablet Take 50 mg by mouth daily as needed for moderate pain.    Yes Historical Provider, MD  Water For Irrigation, Sterile (FREE WATER) SOLN Place 60 mLs into feeding tube every 6 (six) hours. 09/30/15  Yes Calvert Cantor, MD  NONFORMULARY OR COMPOUNDED ITEM Apply 1-2 g topically 4 (four) times daily. Peripheral Neuropathy cream: Bupivacaine 1% Doxepin 3% Gabapentin 6% Ibuprofen 3% Pentoxifylline 3% 01/20/16   Felecia Shelling, DPM   No Known Allergies  FAMILY HISTORY:  family history includes Diabetes in her brother; Hypertension in her mother; Stroke in her mother.   SOCIAL HISTORY:  reports that she has never smoked. She has quit using smokeless tobacco. Her smokeless tobacco use included Snuff. She reports that she does not drink alcohol or use drugs.  REVIEW OF SYSTEMS:  POSITIVES IN BOLD Constitutional: Negative for fever, chills, weight loss, malaise/fatigue and diaphoresis.  HENT: Negative for hearing loss, ear pain, nosebleeds, congestion, sore throat, neck pain, tinnitus and ear discharge.   Eyes: Negative for blurred vision, double vision, photophobia, pain, discharge and redness.  Respiratory: Negative for cough, hemoptysis, sputum production, shortness of breath, wheezing, dyspnea on exertion and stridor.   Cardiovascular: Negative for chest pain, palpitations, orthopnea, claudication, leg swelling and PND.  Gastrointestinal: Negative for heartburn, nausea, vomiting, abdominal pain, diarrhea, constipation, blood in stool and melena.  Genitourinary: Negative for dysuria, urgency, frequency, hematuria and flank pain.  Musculoskeletal: Negative for myalgias, back pain, joint pain and falls.  Skin: Negative for itching and rash.  Neurological: Negative for dizziness, tingling,  tremors, sensory change, speech change, focal weakness, seizures, loss of consciousness, weakness and headaches.  PSY: nervous / anxious Endo/Heme/Allergies: Negative for environmental allergies and polydipsia. Does not bruise/bleed easily.  SUBJECTIVE:   VITAL SIGNS: Temp:  [97.2 F (36.2 C)-98.6 F (37 C)] 97.2 F (36.2 C) (10/24 1115) Pulse Rate:  [91-133] 121 (10/24 1308) Resp:  [14-29] 22 (10/24 1308) BP: (115-162)/(61-92) 140/92 (10/24 1259) SpO2:  [98 %-100 %] 100 % (10/24 1308) FiO2 (%):  [40 %] 40 % (10/24 1308) Weight:  [148 lb 6.4 oz (67.3 kg)-151 lb 9.6 oz (68.8 kg)] 151 lb 9.6 oz (68.8 kg) (10/24 0517)  PHYSICAL EXAMINATION: General:  Frail elderly female in NAD  Neuro:  Awake, alert, generalized weakness / MAE  HEENT:  MM pink/moist, no jvd  Cardiovascular:  s1s2 rrr, no m/r/g Lungs: non-labored, lungs bilaterally clear, large volumes on BiPAP (>1L) Abdomen:  Soft, non-tender, bsx4 active  Musculoskeletal:  No acute deformities  Skin:  Warm/dry, no edema, rashes or lesions   Recent Labs Lab 01/24/16 1633 01/25/16  0327  NA 132* 131*  K 4.9 4.8  CL 100* 97*  CO2 21* 22  BUN 44* 49*  CREATININE 1.51* 1.82*  GLUCOSE 95 206*    Recent Labs Lab 01/24/16 1633 01/25/16 0327  HGB 9.7* 9.6*  HCT 28.9* 28.1*  WBC 6.4 7.1  PLT 370 387   Dg Chest Port 1 View  Result Date: 01/24/2016 CLINICAL DATA:  Shortness of breath. EXAM: PORTABLE CHEST 1 VIEW COMPARISON:  11/09/2015. FINDINGS: Trachea is midline. Heart size stable. Thoracic aorta is calcified. Lungs are clear. No pleural fluid. Right hemidiaphragm is elevated. Degenerative changes are seen in the glenohumeral joints bilaterally. IMPRESSION: No acute findings. Electronically Signed   By: Leanna Battles M.D.   On: 01/24/2016 17:13    SIGNIFICANT EVENTS  10/23  Admit with Atrial flutter  STUDIES:    ASSESSMENT / PLAN:  82 year old female with extensive PMH who presents to the hospital with irregular  heart beat who was found to be in respiratory failure and BiPAP was started.  ABG was was done that revealed a pH of 7.622/too low to detect/188 on BiPAP.  On evaluating patient, she is on BiPAP, moving 22-24 liters MV.  Lungs are clear on exam.  I reviewed CXR myself, no evidence of acute cardiopulmonary process.  DDx of this isolated respiratory acidosis is not hypoxemia (given PaO2 of 188), hyperthyroidism (TSH has been elevated, patient is on replacement and recently amiodarone was discontinued), anxiety, pain, PE and heart failure (most likely culprit).  Discussed with PCCM-NP and Cards-NP.  Respiratory alkalosis: Ddx as above.  - Repeat ABG now, need to calculate Aa gradient.  - Already on anti-coagulation, no reason to take risk with CTA and contrast specially with renal function.  ?PE:  - Lower ext dopplers.  Hypothyroidism:  - Check TSH  - Check free T4  - Continue dose of thyroxin for now  Respiratory failure:  - D/C BiPAP.  - High flow nasal cannula.  PCCM will follow.  Patient seen and examined, agree with above note.  I dictated the care and orders written for this patient under my direction.  Alyson Reedy, MD [phone]  01/25/2016, 2:16 PM

## 2016-01-25 NOTE — Progress Notes (Addendum)
The client is now in NSR in the 80's from flutter in the 120's. Still on amio 16.7 drip. EKG has been taken. Fudim with cardiology was notified. I will continue to monitor the client.  Saddie Benders RN  Feeding was stopped at midnight until the cardioversion orders have been discontinued. At this time the client continues to be in NSR.

## 2016-01-25 NOTE — Progress Notes (Signed)
Pt is very short of breath and has labored breathing at rest.  Mancel Bale, [place] notified and respiratory notified to place pt back on bipap.  Will continue to closely monitor pt.

## 2016-01-25 NOTE — Progress Notes (Signed)
ANTICOAGULATION CONSULT NOTE - Initial Consult  Pharmacy Consult for heparin Indication: DVT  No Known Allergies  Patient Measurements: Height: 5\' 6"  (167.6 cm) Weight: 151 lb 9.6 oz (68.8 kg) IBW/kg (Calculated) : 59.3 Heparin Dosing Weight: 68 Kg  Vital Signs: Temp: 97.2 F (36.2 C) (10/24 1115) Temp Source: Oral (10/24 1115) BP: 140/92 (10/24 1300) Pulse Rate: 121 (10/24 1308)  Labs:  Recent Labs  01/24/16 1633 01/25/16 0327  HGB 9.7* 9.6*  HCT 28.9* 28.1*  PLT 370 387  CREATININE 1.51* 1.82*  TROPONINI <0.03  --    Estimated Creatinine Clearance: 19.2 mL/min (by C-G formula based on SCr of 1.82 mg/dL (H)).  Medical History: Past Medical History:  Diagnosis Date  . Anemia    previous blood transfusions  . Arthritis    "all over"  . Asthma   . Bradycardia    topped BB and amiodarone decreased- junctional rhythm   . COPD (chronic obstructive pulmonary disease) ([place])   . Depression    "light case"  . Gastric stenosis   . GERD (gastroesophageal reflux disease)   . History of stomach ulcers   . Hyperlipidemia   . Hypertension   . Hypothyroidism   . Paraesophageal hernia   . Perforated gastric ulcer ([place])   . Pneumonia   . Renal insufficiency   . SIADH (syndrome of inappropriate ADH production) ([place])    [name] 01/10/2015  . Small bowel obstruction    "I don't know how many" (01/11/2015)  . Stroke ([place])    "light one"  . Type II diabetes mellitus ([place])   . Ventral hernia with bowel obstruction     Medications:  Prescriptions Prior to Admission  Medication Sig Dispense Refill Last Dose  . acetaminophen (TYLENOL) 325 MG tablet Take 650 mg by mouth 2 (two) times daily as needed for mild pain.    01/22/2016 at pm  . ADVAIR DISKUS 250-50 MCG/DOSE AEPB INHALE 1 PUFF INTO THE LUNGS 2 (TWO) TIMES DAILY AS NEEDED FOR SHORTNESS OF BREATH  3 01/24/2016 at am  . albuterol (PROVENTIL HFA;VENTOLIN HFA) 108 (90 Base) MCG/ACT inhaler Inhale 2 puffs into the lungs  every 6 (six) hours as needed for wheezing or shortness of breath. 1 Inhaler 2 01/23/2016 at pm  . AMINO ACIDS-PROTEIN HYDROLYS PO Take 30 mLs by mouth 2 (two) times daily.   01/24/2016 at am  . amiodarone (PACERONE) 100 MG tablet Take 100 mg by mouth daily.   01/24/2016 at am  . amLODipine (NORVASC) 10 MG tablet TAKE 1 TABLET BY MOUTH DAILY FOR HYPERTENSION (Patient taking differently: Take 10 mg by mouth once a day for hypertension) 30 tablet 3 01/24/2016 at 1200  . apixaban (ELIQUIS) 2.5 MG TABS tablet Take 1 tablet (2.5 mg total) by mouth 2 (two) times daily. 180 tablet 1 01/24/2016 at 0900  . atorvastatin (LIPITOR) 10 MG tablet Take 10 mg by mouth daily.   01/23/2016 at 1800  . b complex vitamins tablet Take 1 tablet by mouth daily.    01/24/2016 at 0900  . bisacodyl (DULCOLAX) 10 MG suppository Place 1 suppository (10 mg total) rectally daily as needed for moderate constipation. 12 suppository 0 Taking  . calcium-vitamin D (OSCAL WITH D) 500-200 MG-UNIT per tablet Take 1 tablet by mouth daily with breakfast.    01/24/2016 at am  . cetirizine (ZYRTEC) 10 MG tablet Take 10 mg by mouth daily as needed for allergies or rhinitis.   Past Week at Unknown time  . chlorhexidine (Prichard)  0.12 % solution Use as directed 15 mLs in the mouth or throat 2 (two) times daily.   01/24/2016 at am  . diclofenac sodium (VOLTAREN) 1 % GEL Apply 2 g topically daily as needed (for pain).    Past Week at Unknown time  . feeding supplement (BOOST / RESOURCE BREEZE) LIQD Take 1 Container by mouth daily. (Patient taking differently: Take 1 Container by mouth 2 (two) times daily between meals. )   01/24/2016 at am  . fluticasone (FLONASE) 50 MCG/ACT nasal spray Place 2 sprays into both nostrils daily. 16 g 3 01/24/2016 at am  . gabapentin (NEURONTIN) 300 MG capsule Take 2 tablets in the morning, Take 1 tablet at noon, Take 2 tablets at night for pain (Patient taking differently: Take 300-600 mg by mouth See admin  instructions. 600 mg in the morning then 300 mg at noontime then 600 mg at night (for pain)) 150 capsule 1 01/24/2016 at 1200  . hydrALAZINE (APRESOLINE) 50 MG tablet Take 1 tablet (50 mg total) by mouth 3 (three) times daily. 120 tablet 0 01/24/2016 at 1200  . ipratropium-albuterol (DUONEB) 0.5-2.5 (3) MG/3ML SOLN Take 3 mLs by nebulization every 6 (six) hours as needed (sob). Use 3 times daily x 4 days then every 6 hours as needed. (Patient taking differently: Take 3 mLs by nebulization every 6 (six) hours as needed (for shortness of breath). ) 360 mL 4 Taking  . isosorbide mononitrate (IMDUR) 30 MG 24 hr tablet TAKE 1 TABLET (30 MG TOTAL) BY MOUTH DAILY.  2 01/24/2016 at 0900  . latanoprost (XALATAN) 0.005 % ophthalmic solution Place 1 drop into both eyes at bedtime. 2.5 mL 12 01/23/2016 at pm  . levothyroxine (SYNTHROID, LEVOTHROID) 137 MCG tablet Take 1 tablet (137 mcg total) by mouth daily before breakfast. 30 tablet 2 01/24/2016 at am  . Maltodextrin-Xanthan Gum (RESOURCE THICKENUP CLEAR) POWD Take 120 g by mouth as needed (use to thicken liquids.). (Patient taking differently: See admin instructions. USED TO THICKEN LIQUIDS) 20 Can 0 Past Week at Unknown time  . Multiple Vitamins-Minerals (MULTIVITAMIN PO) Take 1 tablet by mouth daily.   01/24/2016 at am  . Nutritional Supplements (FEEDING SUPPLEMENT, OSMOLITE 1.5 CAL,) LIQD Place 1,000 mLs into feeding tube daily. (Patient taking differently: Place 1,000 mLs into feeding tube daily. (at night for 12 hours)) 1000 mL 3 01/23/2016 at pm  . ondansetron (ZOFRAN) 4 MG tablet Take 1 tablet (4 mg total) by mouth every 8 (eight) hours as needed for nausea or vomiting. 20 tablet 0 Taking  . pantoprazole (PROTONIX) 40 MG tablet TAKE 1 TABLET BY MOUTH TWICE A DAY (Patient taking differently: Take 40 mg by mouth two times a day) 60 tablet 5 01/24/2016 at am  . Polyethyl Glycol-Propyl Glycol 0.4-0.3 % SOLN Place 1 drop into both eyes 4 (four) times daily.     01/24/2016 at am  . polyethylene glycol powder (GLYCOLAX/MIRALAX) powder MIX 17GRAMS IN 8 OUNCES OF LIQUID AND DRINK TWICE DAILY AS NEEDED (Patient taking differently: Mix 17 grams into 8 ounces of juice and drink one to two times a day as needed) 3162 g 1 Past Month at Unknown time  . RESTASIS 0.05 % ophthalmic emulsion USE 1 DROP INTO BOTH EYES TWICE DAILY 60 mL 0 01/24/2016 at am  . sertraline (ZOLOFT) 100 MG tablet Take one tablet by mouth once daily for depression (Patient taking differently: Take 100 mg by mouth daily. FOR DEPRESSION) 90 tablet 1 01/24/2016 at am  . simethicone (  MYLICON) 80 MG chewable tablet Chew 80 mg by mouth 4 (four) times daily as needed for flatulence.   Taking  . sodium chloride 1 g tablet TAKE 1 TABLET (1 g) BY MOUTH THREE TIMES DAILY 90 tablet 2 01/24/2016 at 1200  . traMADol (ULTRAM) 50 MG tablet Take 50 mg by mouth daily as needed for moderate pain.    Past Week at Unknown time  . Water For Irrigation, Sterile (FREE WATER) SOLN Place 60 mLs into feeding tube every 6 (six) hours.   01/24/2016 at am  . NONFORMULARY OR COMPOUNDED ITEM Apply 1-2 g topically 4 (four) times daily. Peripheral Neuropathy cream: Bupivacaine 1% Doxepin 3% Gabapentin 6% Ibuprofen 3% Pentoxifylline 3% 120 each 2 NOT YET at NOT YET   Assessment: Shannon [name] is a 82 y.o. female with a history of paroxysmal atrial fibrillation on Eliquis prior to admission. Patient found to have lower extremity DVT and pharmacy has been consulted to dose heparin. Last dose of Eliquis 10/24 ~0930. HgB 9.6 stable, Platelets 387. No bleeding or abnormal bruising reported.   Goal of Therapy:  Heparin level 0.3-0.7 units/ml  APTT 66-102s Monitor platelets by anticoagulation protocol: Yes   Plan:  Start heparin infusion at 1000 units/hr Check anti-Xa level in 8 hours and daily while on heparin Continue to monitor H&H and platelets  Shannon [name] 01/25/2016,7:17 PM

## 2016-01-25 NOTE — Progress Notes (Signed)
Results of lower extremity dopplers called to Dr. Meda Coffee.  Discusses switching eliquis to IV heparin.  Primary RN Merleen Nicely notified.  Sanda Linger

## 2016-01-25 NOTE — Progress Notes (Signed)
PROGRESS NOTE    Shannon [name]  NWG:[id number] DOB: 05/14/1923 DOA: 01/24/2016 PCP: Kirt Boys, DO   Outpatient Specialists:     Brief Narrative:  Shannon [name] is an 82 y.o. female  With PMHx of a fib, asthma, hypothyroid who was directly admitted from the cardiology office for shortness of breath.  She was found to be in a flutter- rapid.  Her O2 sats were 87% in the office on room air.  Patient has h/o asthma and has been using her inhalers without relief for the last few days.   She has increased LE edema as well.   When patient arrived in the hospital, she was tachypneic using accessory muscles and having increased shortness of breath.  She was started on IV amio, placed on Bipap and given a dose of IV lasix with good UOP.  Hospitalist were consulted to help with wheezing/asthma exacerbation and possible infected G tube.    Patient has has 5 hospitalizations in the last 6 months.  She was diagnosed with gastric outlet obstruction.  She had GJ tube placed by general surgery on 6/22.  IR changed the tube on 10/6 and stated: the tip of the J arm may again migrate into the fundus of the stomach. If this happens, replacement should be either with a modified 22 French gastrojejunostomy tube which has been cut shorter (approximately 10-15 cm) or a shorter direct percutaneous jejunostomy tube given that the family really uses the gastrostomy portion.   Assessment & Plan:   Active Problems:   Gastric outlet obstruction   Asthma with allergic rhinitis   Acute on chronic diastolic heart failure (HCC)   Hypothyroidism due to medication   Atrial flutter (HCC)   Infection   A flutter uncontrolled leading to acute on chronic diastolic HF- patient does not tolerate well -rate in low 110s. -on amiodarone gtt -per primary- for cardioversion tomm -IV lasix given yest  Gastric outlet obstruction with recent g tube placement at [facility] - patient just had tube replaced by IR on 10/5-- has  granulation tissue- appreciate IR -restart tube feeds during day today instead of at night -clear diet PO (NPO after midnight)  Asthma -patient was wheezing but resolved with IV lasix (suspect related to volume overload and atrial flutter) -patient did c/o chest tightness and cough-- wean IV Steroids quicily -nebs scheduled and PRN  Hypothyroidism -continue home meds   DVT prophylaxis:  eliquis  Code Status: partial    Subjective: Breathing still appears to be labored  Objective: Vitals:   01/25/16 0517 01/25/16 0728 01/25/16 0734 01/25/16 1115  BP:  115/61  119/67  Pulse: (!) 108 (!) 108  91  Resp: (!) 29 16  17   Temp:  98.2 F (36.8 C)  97.2 F (36.2 C)  TempSrc:  Oral  Oral  SpO2: 100% 100% 100% 100%  Weight: 68.8 kg (151 lb 9.6 oz)     Height:        Intake/Output Summary (Last 24 hours) at 01/25/16 1204 Last data filed at 01/25/16 0408  Gross per 24 hour  Intake           204.34 ml  Output             2850 ml  Net         -2645.66 ml   Filed Weights   01/24/16 1544 01/25/16 0517  Weight: 68 kg (149 lb 14.4 oz) 68.8 kg (151 lb 9.6 oz)    Examination:  General exam:  increased work of breathing Respiratory system: diminshed, no wheezing Cardiovascular system: irr and fast- 112. Gastrointestinal system: GJ tube in place Central nervous system: Alert and oriented. No focal neurological deficits.    Data Reviewed: I have personally reviewed following labs and imaging studies  CBC:  Recent Labs Lab 01/24/16 1633 01/25/16 0327  WBC 6.4 7.1  NEUTROABS 3.8  --   HGB 9.7* 9.6*  HCT 28.9* 28.1*  MCV 86.8 85.2  PLT 370 387   Basic Metabolic Panel:  Recent Labs Lab 01/24/16 1633 01/25/16 0327  NA 132* 131*  K 4.9 4.8  CL 100* 97*  CO2 21* 22  GLUCOSE 95 206*  BUN 44* 49*  CREATININE 1.51* 1.82*  CALCIUM 10.1 9.7   GFR: Estimated Creatinine Clearance: 19.2 mL/min (by C-G formula based on SCr of 1.82 mg/dL (H)). Liver Function  Tests:  Recent Labs Lab 01/24/16 1633  AST 39  ALT 26  ALKPHOS 68  BILITOT 0.3  PROT 8.1  ALBUMIN 3.8   No results for input(s): LIPASE, AMYLASE in the last 168 hours. No results for input(s): AMMONIA in the last 168 hours. Coagulation Profile: No results for input(s): INR, PROTIME in the last 168 hours. Cardiac Enzymes:  Recent Labs Lab 01/24/16 1633  TROPONINI <0.03   BNP (last 3 results) No results for input(s): PROBNP in the last 8760 hours. HbA1C: No results for input(s): HGBA1C in the last 72 hours. CBG:  Recent Labs Lab 01/24/16 2009 01/25/16 0727  GLUCAP 123* 241*   Lipid Profile: No results for input(s): CHOL, HDL, LDLCALC, TRIG, CHOLHDL, LDLDIRECT in the last 72 hours. Thyroid Function Tests: No results for input(s): TSH, T4TOTAL, FREET4, T3FREE, THYROIDAB in the last 72 hours. Anemia Panel: No results for input(s): VITAMINB12, FOLATE, FERRITIN, TIBC, IRON, RETICCTPCT in the last 72 hours. Urine analysis:    Component Value Date/Time   COLORURINE YELLOW 11/06/2015 1830   APPEARANCEUR CLEAR 11/06/2015 1830   LABSPEC 1.012 11/06/2015 1830   PHURINE 8.0 11/06/2015 1830   GLUCOSEU NEGATIVE 11/06/2015 1830   HGBUR NEGATIVE 11/06/2015 1830   BILIRUBINUR NEGATIVE 11/06/2015 1830   KETONESUR NEGATIVE 11/06/2015 1830   PROTEINUR NEGATIVE 11/06/2015 1830   UROBILINOGEN 0.2 01/27/2015 2308   NITRITE NEGATIVE 11/06/2015 1830   LEUKOCYTESUR NEGATIVE 11/06/2015 1830     )No results found for this or any previous visit (from the past 240 hour(s)).    Anti-infectives    None       Radiology Studies: Dg Chest Port 1 View  Result Date: 01/24/2016 CLINICAL DATA:  Shortness of breath. EXAM: PORTABLE CHEST 1 VIEW COMPARISON:  11/09/2015. FINDINGS: Trachea is midline. Heart size stable. Thoracic aorta is calcified. Lungs are clear. No pleural fluid. Right hemidiaphragm is elevated. Degenerative changes are seen in the glenohumeral joints bilaterally.  IMPRESSION: No acute findings. Electronically Signed   By: Leanna Battles M.D.   On: 01/24/2016 17:13        Scheduled Meds: . acetaminophen  650 mg Oral BID  . amLODipine  10 mg Oral Daily  . apixaban  2.5 mg Oral BID  . atorvastatin  10 mg Oral q1800  . B-complex with vitamin C  1 tablet Oral Daily  . calcium-vitamin D  1 tablet Oral Q breakfast  . chlorhexidine  15 mL Mouth/Throat BID  . cycloSPORINE  1 drop Both Eyes BID  . feeding supplement  1 Container Oral Daily  . feeding supplement (OSMOLITE 1.5 CAL)  1,000 mL Per Tube Q24H  .  fluticasone  2 spray Each Nare Daily  . free water  60 mL Per Tube Q6H  . furosemide  40 mg Intravenous BID  . gabapentin  300 mg Oral TID  . hydrALAZINE  50 mg Oral TID  . insulin aspart  0-9 Units Subcutaneous TID WC  . ipratropium-albuterol  3 mL Nebulization Q6H  . isosorbide mononitrate  30 mg Oral Daily  . latanoprost  1 drop Both Eyes QHS  . levothyroxine  137 mcg Oral QAC breakfast  . methylPREDNISolone (SOLU-MEDROL) injection  40 mg Intravenous Q12H  . mometasone-formoterol  2 puff Inhalation BID  . pantoprazole  40 mg Oral BID  . polyethylene glycol  17 g Oral BID  . polyvinyl alcohol  1 drop Both Eyes QID  . sertraline  100 mg Oral QHS  . sodium chloride flush  3 mL Intravenous Q12H  . sodium chloride flush  3 mL Intravenous Q12H   Continuous Infusions: . sodium chloride    . amiodarone 30 mg/hr (01/25/16 0412)     LOS: 1 day    Time spent: 35 min    Brynlee Pennywell U Marijo Quizon, DO [facility] Pager [phone]  If 7PM-7AM, please contact night-coverage [email or web address] Password [email or web address] 01/25/2016, 12:04 PM

## 2016-01-25 NOTE — Progress Notes (Signed)
Patient Name: Shannon [name] Date of Encounter: 01/25/2016  Primary Cardiologist: Dr. Rhona Raider Problem List     Active Problems:   Gastric outlet obstruction   Asthma with allergic rhinitis   Acute on chronic diastolic heart failure ([place])   Hypothyroidism due to medication   Atrial flutter (HCC)   Infection     Subjective   Breathing has much improved this morning. Able to speak in complete sentences.   Inpatient Medications    Scheduled Meds: . acetaminophen  650 mg Oral BID  . amLODipine  10 mg Oral Daily  . apixaban  2.5 mg Oral BID  . atorvastatin  10 mg Oral q1800  . B-complex with vitamin C  1 tablet Oral Daily  . calcium-vitamin D  1 tablet Oral Q breakfast  . chlorhexidine  15 mL Mouth/Throat BID  . cycloSPORINE  1 drop Both Eyes BID  . feeding supplement  1 Container Oral Daily  . feeding supplement (OSMOLITE 1.5 CAL)  1,000 mL Per Tube Q24H  . fluticasone  2 spray Each Nare Daily  . free water  60 mL Per Tube Q6H  . furosemide  40 mg Intravenous BID  . gabapentin  300 mg Oral TID  . hydrALAZINE  50 mg Oral TID  . ipratropium-albuterol  3 mL Nebulization Q6H  . isosorbide mononitrate  30 mg Oral Daily  . latanoprost  1 drop Both Eyes QHS  . levothyroxine  137 mcg Oral QAC breakfast  . methylPREDNISolone (SOLU-MEDROL) injection  40 mg Intravenous Q6H  . mometasone-formoterol  2 puff Inhalation BID  . pantoprazole  40 mg Oral BID  . polyethylene glycol  17 g Oral BID  . polyvinyl alcohol  1 drop Both Eyes QID  . sertraline  100 mg Oral QHS  . sodium chloride flush  3 mL Intravenous Q12H   Continuous Infusions: . amiodarone 30 mg/hr (01/25/16 0412)   PRN Meds: sodium chloride, acetaminophen, bisacodyl, levalbuterol, ondansetron (ZOFRAN) IV, ondansetron, RESOURCE THICKENUP CLEAR, simethicone, sodium chloride flush, traMADol   Vital Signs    Vitals:   01/25/16 0403 01/25/16 0517 01/25/16 0728 01/25/16 0734  BP: 135/66  115/61   Pulse: (!) 109  (!) 108 (!) 108   Resp: 17 (!) 29 16   Temp: 97.8 F (36.6 C)  98.2 F (36.8 C)   TempSrc: Oral  Oral   SpO2: 100% 100% 100% 100%  Weight:  151 lb 9.6 oz (68.8 kg)    Height:        Intake/Output Summary (Last 24 hours) at 01/25/16 0755 Last data filed at 01/25/16 0408  Gross per 24 hour  Intake           204.34 ml  Output             2850 ml  Net         -2645.66 ml   Filed Weights   01/24/16 1544 01/25/16 0517  Weight: 149 lb 14.4 oz (68 kg) 151 lb 9.6 oz (68.8 kg)    Physical Exam    GEN: Older, frail AA female.   HEENT: Grossly normal.  Neck: Supple, no JVD, carotid bruits, or masses. Cardiac: irreg irreg, 2/6 systolic murmurs, rubs, or gallops. No clubbing, cyanosis, edema.  Radials/DP/PT 2+ and equal bilaterally.  Respiratory:  Diminished bilaterally, expiratory wheezing, R>L GI: Soft, nontender, nondistended, BS + x 4. G-tube in place. MS: no deformity or atrophy. Skin: warm and dry, no rash. 1+ LEE bilaterally. Neuro:  Strength and sensation are intact. Psych: AAOx3.  Normal affect.  Labs    CBC  Recent Labs  01/24/16 1633 01/25/16 0327  WBC 6.4 7.1  NEUTROABS 3.8  --   HGB 9.7* 9.6*  HCT 28.9* 28.1*  MCV 86.8 85.2  PLT 370 696   Basic Metabolic Panel  Recent Labs  01/24/16 1633 01/25/16 0327  NA 132* 131*  K 4.9 4.8  CL 100* 97*  CO2 21* 22  GLUCOSE 95 206*  BUN 44* 49*  CREATININE 1.51* 1.82*  CALCIUM 10.1 9.7   Liver Function Tests  Recent Labs  01/24/16 1633  AST 39  ALT 26  ALKPHOS 68  BILITOT 0.3  PROT 8.1  ALBUMIN 3.8   No results for input(s): LIPASE, AMYLASE in the last 72 hours. Cardiac Enzymes  Recent Labs  01/24/16 1633  TROPONINI <0.03    Telemetry    Atrial Flutter - Personally Reviewed  ECG    N/a- Personally Reviewed  Radiology    Dg Chest Port 1 View  Result Date: 01/24/2016 CLINICAL DATA:  Shortness of breath. EXAM: PORTABLE CHEST 1 VIEW COMPARISON:  11/09/2015. FINDINGS: Trachea is midline.  Heart size stable. Thoracic aorta is calcified. Lungs are clear. No pleural fluid. Right hemidiaphragm is elevated. Degenerative changes are seen in the glenohumeral joints bilaterally. IMPRESSION: No acute findings. Electronically Signed   By: Lorin Picket M.D.   On: 01/24/2016 17:13    Cardiac Studies     Patient Profile     Shannon [name] is a 82 y.o. female with a history of paroxysmal atrial fibrillation on amiodarone and Eliquis as well as a history of hypothyroidism,bradycardia, non-obstructive coronary artery disease, type 2 diabetes, hypertension, cerebro-vascular disease and COPD/ashtma who was admitted from the office yesterday with atrial flutter and dyspnea.  Assessment & Plan    1. Atrial Flutter: Placed on IV amiodarone on admission, rates remain in the low 100s. No addition of BB or diltiazem on admission as she was bradycardiac in august of this year. Continue Eliquis 2.5mg  daily.   2. Acute on Chronic diastolic HF: Seemed to have been 2/2 atrial flutter. Has diuresed briskly overnight with 2.6 L. Initially required Bi-pap on admission, but has been transitioned off. Breathing has much improved this morning.  -- IV lasix was reduced to 40mg  IV BID today. Cr with slight increase 1.5>>1.82. Monitor with diuresis.   3. Asthma: Likely with acute exacerbation on admission. Breathing has improved. Expiratory wheezing R>L today, but seems to have improved since yesterday. Neb, and IV steroids. IM following and appreciate input.   4. H/o GOO s/p g-tube: There was concern about pus around site, IM asked to assist with management. IR consulted Unsure of her diet and fluid status prior to admission. Will ask speech therapy to evaluate now that she has been transitioned off Bi-pap.   5. Hypothyroidism: Last TSH was 42 9/17, medications have been adjusted by PCP.   Signed, Reino Bellis, NP  01/25/2016, 7:55 AM   The patient was seen, examined and discussed with Reino Bellis,  NP-C and I agree with the above.   This is a very pleasant 82-year-old female that is very well-known to me, she has history of chronic diastolic heart failure and new diagnoses of atrial flutter. She presented to the clinic yesterday with acute onset shortness of breath and was found to be in acute on chronic diastolic heart failure that is believed to be secondary to new onset atrial flutter. She was started  on diuretics, breathing treatment and IV amiodarone yesterday with good diuresis overnight, -2.6 L, however her creatinine increased from 1.5-1.8. Her breathing has improved but she remains short of breath just while talking, her breathing is labored, she is also complaining of left-sided jaw pain and ear pain. This has been a chronic problem for her and has been using tramadol at home.. We will start around-the-clock breathing treatments, continue IV amiodarone, she is already on Eliquis and has been compliant, we will schedule DC cardioversion for tomorrow, we will restart her tube feeds today but hold them in the morning. We will hold any further Lasix today and reassess tomorrow.  Ena Dawley 01/25/2016

## 2016-01-25 NOTE — Progress Notes (Addendum)
    Called by nursing staff that patient became short of breath acutely. RT at the bedside and gave a breathing treatment with little relief. On my assessment lungs with some bilateral crackles and use of accessory muscles. Will place back on Bipap, give 20mg  IV lasix and monitor output.   After being placed on Bipap, she appeared less dyspneic and respirations improved. The patient reported feeling better. Will keep on Bipap for now, she remains on stepdown. Plan for DCCV tomorrow per Dr. Meda Coffee.   Addendum: Reviewed ABG results and discussed with Dr. Meda Coffee. Will consult PCCM given ongoing need for Bipap.   Reino Bellis NP-c

## 2016-01-25 NOTE — Addendum Note (Signed)
Addended by: Juanda Crumble R on: 01/25/2016 10:20 AM   Modules accepted: Orders

## 2016-01-25 NOTE — Progress Notes (Signed)
Referring Physician(s): Dr. Eulogio Bear  Supervising Physician: Arne Cleveland  Patient Status: [facility] - In-pt  Chief Complaint: GJ tube dysfunction  Subjective: We have been asked to see the patient to evaluate her GJ tube.  She has this initially placed by surgery and subsequently replaced by IR on 10/5.  She has been admitted for other issues and states that her daughter sometimes has a hard time flushing her tube.  We have been asked to evaluate her tube for further recommendations.  Allergies: Review of patient's allergies indicates no known allergies.  Medications: Prior to Admission medications   Medication Sig Start Date End Date Taking? Authorizing Provider  acetaminophen (TYLENOL) 325 MG tablet Take 650 mg by mouth 2 (two) times daily as needed for mild pain.    Yes Historical Provider, MD  ADVAIR DISKUS 250-50 MCG/DOSE AEPB INHALE 1 PUFF INTO THE LUNGS 2 (TWO) TIMES DAILY AS NEEDED FOR SHORTNESS OF BREATH 12/01/14  Yes Historical Provider, MD  albuterol (PROVENTIL HFA;VENTOLIN HFA) 108 (90 Base) MCG/ACT inhaler Inhale 2 puffs into the lungs every 6 (six) hours as needed for wheezing or shortness of breath. 06/04/15  Yes Monica Carter, DO  AMINO ACIDS-PROTEIN HYDROLYS PO Take 30 mLs by mouth 2 (two) times daily.   Yes Historical Provider, MD  amiodarone (PACERONE) 100 MG tablet Take 100 mg by mouth daily.   Yes Historical Provider, MD  amLODipine (NORVASC) 10 MG tablet TAKE 1 TABLET BY MOUTH DAILY FOR HYPERTENSION Patient taking differently: Take 10 mg by mouth once a day for hypertension 11/29/15  Yes Gildardo Cranker, DO  apixaban (ELIQUIS) 2.5 MG TABS tablet Take 1 tablet (2.5 mg total) by mouth 2 (two) times daily. 08/11/15  Yes Gildardo Cranker, DO  atorvastatin (LIPITOR) 10 MG tablet Take 10 mg by mouth daily.   Yes Historical Provider, MD  b complex vitamins tablet Take 1 tablet by mouth daily.    Yes Historical Provider, MD  bisacodyl (DULCOLAX) 10 MG suppository Place 1  suppository (10 mg total) rectally daily as needed for moderate constipation. 09/30/15  Yes Debbe Odea, MD  calcium-vitamin D (OSCAL WITH D) 500-200 MG-UNIT per tablet Take 1 tablet by mouth daily with breakfast.    Yes Historical Provider, MD  cetirizine (ZYRTEC) 10 MG tablet Take 10 mg by mouth daily as needed for allergies or rhinitis.   Yes Historical Provider, MD  chlorhexidine (PERIDEX) 0.12 % solution Use as directed 15 mLs in the mouth or throat 2 (two) times daily.   Yes Historical Provider, MD  diclofenac sodium (VOLTAREN) 1 % GEL Apply 2 g topically daily as needed (for pain).    Yes Historical Provider, MD  feeding supplement (BOOST / RESOURCE BREEZE) LIQD Take 1 Container by mouth daily. Patient taking differently: Take 1 Container by mouth 2 (two) times daily between meals.  11/11/15  Yes Eugenie Filler, MD  fluticasone [facility]) 50 MCG/ACT nasal spray Place 2 sprays into both nostrils daily. 08/07/14  Yes Gildardo Cranker, DO  gabapentin (NEURONTIN) 300 MG capsule Take 2 tablets in the morning, Take 1 tablet at noon, Take 2 tablets at night for pain Patient taking differently: Take 300-600 mg by mouth See admin instructions. 600 mg in the morning then 300 mg at noontime then 600 mg at night (for pain) 11/29/15  Yes [name], NP  hydrALAZINE (APRESOLINE) 50 MG tablet Take 1 tablet (50 mg total) by mouth 3 (three) times daily. 11/11/15  Yes Eugenie Filler, MD  ipratropium-albuterol (DUONEB) 0.5-2.5 (3) MG/3ML SOLN Take 3 mLs by nebulization every 6 (six) hours as needed (sob). Use 3 times daily x 4 days then every 6 hours as needed. Patient taking differently: Take 3 mLs by nebulization every 6 (six) hours as needed (for shortness of breath).  11/11/15  Yes Eugenie Filler, MD  isosorbide mononitrate (IMDUR) 30 MG 24 hr tablet TAKE 1 TABLET (30 MG TOTAL) BY MOUTH DAILY. 06/22/15  Yes Historical Provider, MD  latanoprost (XALATAN) 0.005 % ophthalmic solution Place 1 drop into both  eyes at bedtime. 06/02/14  Yes Mahima Bubba Camp, MD  levothyroxine (SYNTHROID, LEVOTHROID) 137 MCG tablet Take 1 tablet (137 mcg total) by mouth daily before breakfast. 12/27/15  Yes Gildardo Cranker, DO  Maltodextrin-Xanthan Gum (RESOURCE THICKENUP CLEAR) POWD Take 120 g by mouth as needed (use to thicken liquids.). Patient taking differently: See admin instructions. USED TO THICKEN LIQUIDS 11/11/15  Yes Eugenie Filler, MD  Multiple Vitamins-Minerals (MULTIVITAMIN PO) Take 1 tablet by mouth daily.   Yes Historical Provider, MD  Nutritional Supplements (FEEDING SUPPLEMENT, OSMOLITE 1.5 CAL,) LIQD Place 1,000 mLs into feeding tube daily. Patient taking differently: Place 1,000 mLs into feeding tube daily. (at night for 12 hours) 09/30/15  Yes Debbe Odea, MD  ondansetron (ZOFRAN) 4 MG tablet Take 1 tablet (4 mg total) by mouth every 8 (eight) hours as needed for nausea or vomiting. 09/07/15  Yes [name], NP  pantoprazole (PROTONIX) 40 MG tablet TAKE 1 TABLET BY MOUTH TWICE A DAY Patient taking differently: Take 40 mg by mouth two times a day 08/11/15  Yes Gildardo Cranker, DO  Polyethyl Glycol-Propyl Glycol 0.4-0.3 % SOLN Place 1 drop into both eyes 4 (four) times daily.    Yes Historical Provider, MD  polyethylene glycol powder (GLYCOLAX/MIRALAX) powder MIX 17GRAMS IN 8 OUNCES OF LIQUID AND DRINK TWICE DAILY AS NEEDED Patient taking differently: Mix 17 grams into 8 ounces of juice and drink one to two times a day as needed 09/13/15  Yes Monica Carter, DO  RESTASIS 0.05 % ophthalmic emulsion USE 1 DROP INTO BOTH EYES TWICE DAILY 11/01/15  Yes Estill Dooms, MD  sertraline (ZOLOFT) 100 MG tablet Take one tablet by mouth once daily for depression Patient taking differently: Take 100 mg by mouth daily. FOR DEPRESSION 09/03/15  Yes Gildardo Cranker, DO  simethicone (MYLICON) 80 MG chewable tablet Chew 80 mg by mouth 4 (four) times daily as needed for flatulence.   Yes Historical Provider, MD  sodium chloride 1 g  tablet TAKE 1 TABLET (1 g) BY MOUTH THREE TIMES DAILY 11/11/15  Yes Eugenie Filler, MD  traMADol (ULTRAM) 50 MG tablet Take 50 mg by mouth daily as needed for moderate pain.    Yes Historical Provider, MD  Water For Irrigation, Sterile (FREE WATER) SOLN Place 60 mLs into feeding tube every 6 (six) hours. 09/30/15  Yes Debbe Odea, MD  NONFORMULARY OR COMPOUNDED ITEM Apply 1-2 g topically 4 (four) times daily. Peripheral Neuropathy cream: Bupivacaine 1% Doxepin 3% Gabapentin 6% Ibuprofen 3% Pentoxifylline 3% 01/20/16   Edrick Kins, DPM    Vital Signs: BP 115/61 (BP Location: Left Arm)   Pulse (!) 108   Temp 98.2 F (36.8 C) (Oral)   Resp 16   Ht 5\' 6"  (1.676 m)   Wt 151 lb 9.6 oz (68.8 kg)   SpO2 100%   BMI 24.47 kg/m   Physical Exam: Abd: soft, GJ-tube in good position.  She has some  hypergranular tissue around her tube, but otherwise no evidence of infection or other issues noted with tube.  Both the G and the J portion of her tube were flushed with ease.  No evidence of clogging noted.  Imaging: Dg Chest Port 1 View  Result Date: 01/24/2016 CLINICAL DATA:  Shortness of breath. EXAM: PORTABLE CHEST 1 VIEW COMPARISON:  11/09/2015. FINDINGS: Trachea is midline. Heart size stable. Thoracic aorta is calcified. Lungs are clear. No pleural fluid. Right hemidiaphragm is elevated. Degenerative changes are seen in the glenohumeral joints bilaterally. IMPRESSION: No acute findings. Electronically Signed   By: Lorin Picket M.D.   On: 01/24/2016 17:13    Labs:  CBC:  Recent Labs  11/10/15 0334 11/11/15 0445 01/24/16 1633 01/25/16 0327  WBC 6.8 8.6 6.4 7.1  HGB 9.0* 8.3* 9.7* 9.6*  HCT 26.4* 24.7* 28.9* 28.1*  PLT 321 287 370 387    COAGS:  Recent Labs  01/29/15 0258 08/02/15 1638  09/13/15 0848  09/16/15 0322 09/19/15 0418 09/22/15 0457 09/23/15 1020  INR 1.20 1.24  --  1.37  --   --   --  1.17  --   APTT  --   --   < > 33  < > 37 64* 80* 30  < > = values in this  interval not displayed.  BMP:  Recent Labs  11/11/15 0445 11/25/15 1524 12/09/15 12/13/15 01/24/16 1633 01/25/16 0327  NA 133* 135 136* 138 132* 131*  K 4.2 4.8 5.4* 5.1 4.9 4.8  CL 104 103  --   --  100* 97*  CO2 20* 21  --   --  21* 22  GLUCOSE 157* 114*  --   --  95 206*  BUN 39* 45* 45* 36* 44* 49*  CALCIUM 9.1 9.7  --   --  10.1 9.7  CREATININE 1.36* 1.59* 1.6* 1.3* 1.51* 1.82*  GFRNONAA 33* 28*  --   --  29* 23*  GFRAA 38* 33*  --   --  34* 27*    LIVER FUNCTION TESTS:  Recent Labs  09/17/15 0415 10/06/15 1151 11/06/15 1612 01/24/16 1633  BILITOT 0.6 0.2 0.5 0.3  AST 23 19 26  39  ALT 16 12 23 26   ALKPHOS 40 47 49 68  PROT 5.8* 7.1 7.6 8.1  ALBUMIN 2.5* 3.5 3.6 3.8    Assessment and Plan: 1. GJ tube -her GJ tube is working very well.  There is no evidence of infection or malfunctioning of her tube.  This may be used as normally needed. -j-tube should not be used for medications unless liquid to avoid clogging of this tube. -no further intervention needed at this time. -call with questions  Electronically Signed: Nathian Stencil E 01/25/2016, 8:21 AM   I spent a total of 15 Minutes at the the patient's bedside AND on the patient's hospital floor or unit, greater than 50% of which was counseling/coordinating care for [place] tube dysfunction

## 2016-01-25 NOTE — Progress Notes (Signed)
SLP Cancellation Note  Patient Details Name: Yulitza Shorts MRN: [id number] DOB: 11/18/1923   Cancelled treatment:       Reason Eval/Treat Not Completed: Medical issues which prohibited therapy. Pt having trouble breathing this afternoon, recently placed on BiPAP. Discussed with RN - will f/u for possible swallow evaluation next date.   Germain Osgood 01/25/2016, 1:41 PM  Germain Osgood, M.A. CCC-SLP [phone]

## 2016-01-25 NOTE — Progress Notes (Addendum)
**  Preliminary report by tech**  Bilateral lower extremity venous duplex completed. There is evidence of deep vein thrombosis involving the left lower extremity. Deep vein thrombosis involves the popliteal vein, peroneal, and posterior tibial veins of the left lower extremity. There is no evidence of superficial vein thrombosis involving the left lower extremity. There is no evidence of deep or superficial vein thrombosis involving the right lower extremity. There is no evidence of Baker's cysts bilaterally.  Results were given to the patient's nurse, Merleen Nicely.  01/25/16 4:14 PM Carlos Levering RVT

## 2016-01-26 ENCOUNTER — Encounter (HOSPITAL_COMMUNITY)
Admission: AD | Disposition: A | Discharge status: Home / Self Care | Payer: Self-pay | Source: Ambulatory Visit | Attending: Cardiology

## 2016-01-26 DIAGNOSIS — I82442 Acute embolism and thrombosis of left tibial vein: Secondary | ICD-10-CM

## 2016-01-26 DIAGNOSIS — I82432 Acute embolism and thrombosis of left popliteal vein: Secondary | ICD-10-CM

## 2016-01-26 DIAGNOSIS — J9600 Acute respiratory failure, unspecified whether with hypoxia or hypercapnia: Secondary | ICD-10-CM

## 2016-01-26 DIAGNOSIS — I480 Paroxysmal atrial fibrillation: Secondary | ICD-10-CM

## 2016-01-26 DIAGNOSIS — F0390 Unspecified dementia without behavioral disturbance: Secondary | ICD-10-CM

## 2016-01-26 DIAGNOSIS — E873 Alkalosis: Secondary | ICD-10-CM

## 2016-01-26 DIAGNOSIS — R0602 Shortness of breath: Secondary | ICD-10-CM

## 2016-01-26 DIAGNOSIS — I82402 Acute embolism and thrombosis of unspecified deep veins of left lower extremity: Secondary | ICD-10-CM

## 2016-01-26 DIAGNOSIS — N289 Disorder of kidney and ureter, unspecified: Secondary | ICD-10-CM

## 2016-01-26 LAB — BASIC METABOLIC PANEL
Anion gap: 17 — ABNORMAL HIGH (ref 5–15)
BUN: 69 mg/dL — ABNORMAL HIGH (ref 6–20)
CO2: 20 mmol/L — ABNORMAL LOW (ref 22–32)
Calcium: 10.2 mg/dL (ref 8.9–10.3)
Chloride: 95 mmol/L — ABNORMAL LOW (ref 101–111)
Creatinine, Ser: 2.15 mg/dL — ABNORMAL HIGH (ref 0.44–1.00)
GFR calc Af Amer: 22 mL/min — ABNORMAL LOW (ref >60)
GFR calc non Af Amer: 19 mL/min — ABNORMAL LOW (ref >60)
Glucose, Bld: 169 mg/dL — ABNORMAL HIGH (ref 65–99)
Potassium: 4.3 mmol/L (ref 3.5–5.1)
Sodium: 132 mmol/L — ABNORMAL LOW (ref 135–145)

## 2016-01-26 LAB — GLUCOSE, CAPILLARY
Glucose-Capillary: 158 mg/dL — ABNORMAL HIGH (ref 65–99)
Glucose-Capillary: 165 mg/dL — ABNORMAL HIGH (ref 65–99)
Glucose-Capillary: 202 mg/dL — ABNORMAL HIGH (ref 65–99)
Glucose-Capillary: 216 mg/dL — ABNORMAL HIGH (ref 65–99)
Glucose-Capillary: 235 mg/dL — ABNORMAL HIGH (ref 65–99)
Glucose-Capillary: 239 mg/dL — ABNORMAL HIGH (ref 65–99)

## 2016-01-26 LAB — CBC
HCT: 27.3 % — ABNORMAL LOW (ref 36.0–46.0)
Hemoglobin: 9.6 g/dL — ABNORMAL LOW (ref 12.0–15.0)
MCH: 29.3 pg (ref 26.0–34.0)
MCHC: 35.2 g/dL (ref 30.0–36.0)
MCV: 83.2 fL (ref 78.0–100.0)
Platelets: 406 10*3/uL — ABNORMAL HIGH (ref 150–400)
RBC: 3.28 MIL/uL — ABNORMAL LOW (ref 3.87–5.11)
RDW: 16.1 % — ABNORMAL HIGH (ref 11.5–15.5)
WBC: 12.4 10*3/uL — ABNORMAL HIGH (ref 4.0–10.5)

## 2016-01-26 LAB — TROPONIN I
Troponin I: <0.03 ng/mL (ref <0.03)
Troponin I: <0.03 ng/mL (ref <0.03)
Troponin I: <0.03 ng/mL (ref <0.03)

## 2016-01-26 LAB — HEPARIN LEVEL (UNFRACTIONATED)
Heparin Unfractionated: >2.2 IU/mL — ABNORMAL HIGH (ref 0.30–0.70)
Heparin Unfractionated: 2.04 IU/mL — ABNORMAL HIGH (ref 0.30–0.70)

## 2016-01-26 LAB — APTT
aPTT: 102 seconds — ABNORMAL HIGH (ref 24–36)
aPTT: 81 seconds — ABNORMAL HIGH (ref 24–36)

## 2016-01-26 SURGERY — CARDIOVERSION
Anesthesia: Monitor Anesthesia Care

## 2016-01-26 MED ORDER — METHYLPREDNISOLONE SODIUM SUCC 40 MG IJ SOLR
40.0000 mg | Freq: Every day | INTRAMUSCULAR | Status: DC
  Administered 2016-01-27 – 2016-02-01 (×6): 40 mg via INTRAVENOUS
  Filled 2016-01-26 (×6): qty 1

## 2016-01-26 MED ORDER — AMIODARONE HCL 200 MG PO TABS
400.0000 mg | ORAL_TABLET | Freq: Two times a day (BID) | ORAL | Status: DC
  Administered 2016-01-26 – 2016-01-31 (×11): 400 mg via ORAL
  Filled 2016-01-26 (×11): qty 2

## 2016-01-26 MED ORDER — IPRATROPIUM-ALBUTEROL 0.5-2.5 (3) MG/3ML IN SOLN
3.0000 mL | Freq: Three times a day (TID) | RESPIRATORY_TRACT | Status: DC
  Administered 2016-01-26 – 2016-02-02 (×21): 3 mL via RESPIRATORY_TRACT
  Filled 2016-01-26 (×21): qty 3

## 2016-01-26 NOTE — Progress Notes (Signed)
ANTICOAGULATION CONSULT NOTE - Initial Consult  Pharmacy Consult for heparin Indication: DVT  No Known Allergies  Patient Measurements: Height: 5\' 6"  (167.6 cm) Weight: 145 lb 1.6 oz (65.8 kg) IBW/kg (Calculated) : 59.3 Heparin Dosing Weight: 68 Kg  Vital Signs: Temp: 97.9 F (36.6 C) (10/25 0729) Temp Source: Oral (10/25 0729) BP: 151/62 (10/25 0837) Pulse Rate: 77 (10/25 0837)  Labs:  Recent Labs  01/24/16 1633 01/25/16 0327 01/26/16 0836  HGB 9.7* 9.6* 9.6*  HCT 28.9* 28.1* 27.3*  PLT 370 387 406*  APTT  --   --  102*  HEPARINUNFRC  --   --  >2.20*  CREATININE 1.51* 1.82* 2.15*  TROPONINI <0.03  --   --    Estimated Creatinine Clearance: 16.3 mL/min (by C-G formula based on SCr of 2.15 mg/dL (H)).  Medical History: Past Medical History:  Diagnosis Date  . Anemia    previous blood transfusions  . Arthritis    "all over"  . Asthma   . Bradycardia    topped BB and amiodarone decreased- junctional rhythm   . COPD (chronic obstructive pulmonary disease) ([place])   . Depression    "light case"  . Gastric stenosis   . GERD (gastroesophageal reflux disease)   . History of stomach ulcers   . Hyperlipidemia   . Hypertension   . Hypothyroidism   . Paraesophageal hernia   . Perforated gastric ulcer ([place])   . Pneumonia   . Renal insufficiency   . SIADH (syndrome of inappropriate ADH production) ([place])    [name] 01/10/2015  . Small bowel obstruction    "I don't know how many" (01/11/2015)  . Stroke ([place])    "light one"  . Type II diabetes mellitus ([place])   . Ventral hernia with bowel obstruction     Medications:  Prescriptions Prior to Admission  Medication Sig Dispense Refill Last Dose  . acetaminophen (TYLENOL) 325 MG tablet Take 650 mg by mouth 2 (two) times daily as needed for mild pain.    01/22/2016 at pm  . ADVAIR DISKUS 250-50 MCG/DOSE AEPB INHALE 1 PUFF INTO THE LUNGS 2 (TWO) TIMES DAILY AS NEEDED FOR SHORTNESS OF BREATH  3 01/24/2016 at am  .  albuterol (PROVENTIL HFA;VENTOLIN HFA) 108 (90 Base) MCG/ACT inhaler Inhale 2 puffs into the lungs every 6 (six) hours as needed for wheezing or shortness of breath. 1 Inhaler 2 01/23/2016 at pm  . AMINO ACIDS-PROTEIN HYDROLYS PO Take 30 mLs by mouth 2 (two) times daily.   01/24/2016 at am  . amiodarone (PACERONE) 100 MG tablet Take 100 mg by mouth daily.   01/24/2016 at am  . amLODipine (NORVASC) 10 MG tablet TAKE 1 TABLET BY MOUTH DAILY FOR HYPERTENSION (Patient taking differently: Take 10 mg by mouth once a day for hypertension) 30 tablet 3 01/24/2016 at 1200  . apixaban (ELIQUIS) 2.5 MG TABS tablet Take 1 tablet (2.5 mg total) by mouth 2 (two) times daily. 180 tablet 1 01/24/2016 at 0900  . atorvastatin (LIPITOR) 10 MG tablet Take 10 mg by mouth daily.   01/23/2016 at 1800  . b complex vitamins tablet Take 1 tablet by mouth daily.    01/24/2016 at 0900  . bisacodyl (DULCOLAX) 10 MG suppository Place 1 suppository (10 mg total) rectally daily as needed for moderate constipation. 12 suppository 0 Taking  . calcium-vitamin D (OSCAL WITH D) 500-200 MG-UNIT per tablet Take 1 tablet by mouth daily with breakfast.    01/24/2016 at am  . cetirizine (  ZYRTEC) 10 MG tablet Take 10 mg by mouth daily as needed for allergies or rhinitis.   Past Week at Unknown time  . chlorhexidine (PERIDEX) 0.12 % solution Use as directed 15 mLs in the mouth or throat 2 (two) times daily.   01/24/2016 at am  . diclofenac sodium (VOLTAREN) 1 % GEL Apply 2 g topically daily as needed (for pain).    Past Week at Unknown time  . feeding supplement (BOOST / RESOURCE BREEZE) LIQD Take 1 Container by mouth daily. (Patient taking differently: Take 1 Container by mouth 2 (two) times daily between meals. )   01/24/2016 at am  . fluticasone (FLONASE) 50 MCG/ACT nasal spray Place 2 sprays into both nostrils daily. 16 g 3 01/24/2016 at am  . gabapentin (NEURONTIN) 300 MG capsule Take 2 tablets in the morning, Take 1 tablet at noon, Take 2  tablets at night for pain (Patient taking differently: Take 300-600 mg by mouth See admin instructions. 600 mg in the morning then 300 mg at noontime then 600 mg at night (for pain)) 150 capsule 1 01/24/2016 at 1200  . hydrALAZINE (APRESOLINE) 50 MG tablet Take 1 tablet (50 mg total) by mouth 3 (three) times daily. 120 tablet 0 01/24/2016 at 1200  . ipratropium-albuterol (DUONEB) 0.5-2.5 (3) MG/3ML SOLN Take 3 mLs by nebulization every 6 (six) hours as needed (sob). Use 3 times daily x 4 days then every 6 hours as needed. (Patient taking differently: Take 3 mLs by nebulization every 6 (six) hours as needed (for shortness of breath). ) 360 mL 4 Taking  . isosorbide mononitrate (IMDUR) 30 MG 24 hr tablet TAKE 1 TABLET (30 MG TOTAL) BY MOUTH DAILY.  2 01/24/2016 at 0900  . latanoprost (XALATAN) 0.005 % ophthalmic solution Place 1 drop into both eyes at bedtime. 2.5 mL 12 01/23/2016 at pm  . levothyroxine (SYNTHROID, LEVOTHROID) 137 MCG tablet Take 1 tablet (137 mcg total) by mouth daily before breakfast. 30 tablet 2 01/24/2016 at am  . Maltodextrin-Xanthan Gum (RESOURCE THICKENUP CLEAR) POWD Take 120 g by mouth as needed (use to thicken liquids.). (Patient taking differently: See admin instructions. USED TO THICKEN LIQUIDS) 20 Can 0 Past Week at Unknown time  . Multiple Vitamins-Minerals (MULTIVITAMIN PO) Take 1 tablet by mouth daily.   01/24/2016 at am  . Nutritional Supplements (FEEDING SUPPLEMENT, OSMOLITE 1.5 CAL,) LIQD Place 1,000 mLs into feeding tube daily. (Patient taking differently: Place 1,000 mLs into feeding tube daily. (at night for 12 hours)) 1000 mL 3 01/23/2016 at pm  . ondansetron (ZOFRAN) 4 MG tablet Take 1 tablet (4 mg total) by mouth every 8 (eight) hours as needed for nausea or vomiting. 20 tablet 0 Taking  . pantoprazole (PROTONIX) 40 MG tablet TAKE 1 TABLET BY MOUTH TWICE A DAY (Patient taking differently: Take 40 mg by mouth two times a day) 60 tablet 5 01/24/2016 at am  . Polyethyl  Glycol-Propyl Glycol 0.4-0.3 % SOLN Place 1 drop into both eyes 4 (four) times daily.    01/24/2016 at am  . polyethylene glycol powder (GLYCOLAX/MIRALAX) powder MIX 17GRAMS IN 8 OUNCES OF LIQUID AND DRINK TWICE DAILY AS NEEDED (Patient taking differently: Mix 17 grams into 8 ounces of juice and drink one to two times a day as needed) 3162 g 1 Past Month at Unknown time  . RESTASIS 0.05 % ophthalmic emulsion USE 1 DROP INTO BOTH EYES TWICE DAILY 60 mL 0 01/24/2016 at am  . sertraline (ZOLOFT) 100 MG tablet Take one  tablet by mouth once daily for depression (Patient taking differently: Take 100 mg by mouth daily. FOR DEPRESSION) 90 tablet 1 01/24/2016 at am  . simethicone (MYLICON) 80 MG chewable tablet Chew 80 mg by mouth 4 (four) times daily as needed for flatulence.   Taking  . sodium chloride 1 g tablet TAKE 1 TABLET (1 g) BY MOUTH THREE TIMES DAILY 90 tablet 2 01/24/2016 at 1200  . traMADol (ULTRAM) 50 MG tablet Take 50 mg by mouth daily as needed for moderate pain.    Past Week at Unknown time  . Water For Irrigation, Sterile (FREE WATER) SOLN Place 60 mLs into feeding tube every 6 (six) hours.   01/24/2016 at am  . NONFORMULARY OR COMPOUNDED ITEM Apply 1-2 g topically 4 (four) times daily. Peripheral Neuropathy cream: Bupivacaine 1% Doxepin 3% Gabapentin 6% Ibuprofen 3% Pentoxifylline 3% 120 each 2 NOT YET at NOT YET   Assessment: Shannon [name] is a 82 y.o. female on Apixaban PTA for Afib now with new DVT - last dose 10/24 at ~0930. Continues on heparin per pharmacy.   Initial aPTT of 102 sec therapeutic but, at upper end of goal range. HL >2.20. Will reduce rate slightly to ensure remains in range. CBC stable - Hgb 9.6, plt 406.  Goal of Therapy:  Heparin level 0.3-0.7 units/ml  APTT 66-102s Monitor platelets by anticoagulation protocol: Yes   Plan:  -Decrease heparin gtt to 900 units/hr -8hr aPTT/HL -Daily aPTT until HL correlation -F/U long-term AC plans -Monitor CBC, s/sx  bleeding   Stephens November, PharmD Clinical Pharmacist 10:31 AM, 01/26/2016

## 2016-01-26 NOTE — Progress Notes (Signed)
ANTICOAGULATION CONSULT NOTE - Initial Consult  Pharmacy Consult for heparin Indication: DVT  No Known Allergies  Patient Measurements: Height: 5\' 6"  (167.6 cm) Weight: 145 lb 1.6 oz (65.8 kg) IBW/kg (Calculated) : 59.3 Heparin Dosing Weight: 68 Kg  Vital Signs: Temp: 98.1 F (36.7 C) (10/25 1933) Temp Source: Oral (10/25 1933) BP: 137/48 (10/25 1939) Pulse Rate: 82 (10/25 1939)  Labs:  Recent Labs  01/24/16 1633 01/25/16 0327 01/26/16 0836 01/26/16 1041 01/26/16 1537 01/26/16 1859  HGB 9.7* 9.6* 9.6*  --   --   --   HCT 28.9* 28.1* 27.3*  --   --   --   PLT 370 387 406*  --   --   --   APTT  --   --  102*  --   --  81*  HEPARINUNFRC  --   --  >2.20*  --   --  2.04*  CREATININE 1.51* 1.82* 2.15*  --   --   --   TROPONINI <0.03  --   --  <0.03 <0.03  --    Estimated Creatinine Clearance: 16.3 mL/min (by C-G formula based on SCr of 2.15 mg/dL (H)).  Medical History: Past Medical History:  Diagnosis Date  . Anemia    previous blood transfusions  . Arthritis    "all over"  . Asthma   . Bradycardia    topped BB and amiodarone decreased- junctional rhythm   . COPD (chronic obstructive pulmonary disease) ([place])   . Depression    "light case"  . Gastric stenosis   . GERD (gastroesophageal reflux disease)   . History of stomach ulcers   . Hyperlipidemia   . Hypertension   . Hypothyroidism   . Paraesophageal hernia   . Perforated gastric ulcer ([place])   . Pneumonia   . Renal insufficiency   . SIADH (syndrome of inappropriate ADH production) ([place])    [name] 01/10/2015  . Small bowel obstruction    "I don't know how many" (01/11/2015)  . Stroke ([place])    "light one"  . Type II diabetes mellitus ([place])   . Ventral hernia with bowel obstruction    Medications:  Prescriptions Prior to Admission  Medication Sig Dispense Refill Last Dose  . acetaminophen (TYLENOL) 325 MG tablet Take 650 mg by mouth 2 (two) times daily as needed for mild pain.    01/22/2016 at pm   . ADVAIR DISKUS 250-50 MCG/DOSE AEPB INHALE 1 PUFF INTO THE LUNGS 2 (TWO) TIMES DAILY AS NEEDED FOR SHORTNESS OF BREATH  3 01/24/2016 at am  . albuterol (PROVENTIL HFA;VENTOLIN HFA) 108 (90 Base) MCG/ACT inhaler Inhale 2 puffs into the lungs every 6 (six) hours as needed for wheezing or shortness of breath. 1 Inhaler 2 01/23/2016 at pm  . AMINO ACIDS-PROTEIN HYDROLYS PO Take 30 mLs by mouth 2 (two) times daily.   01/24/2016 at am  . amiodarone (PACERONE) 100 MG tablet Take 100 mg by mouth daily.   01/24/2016 at am  . amLODipine (NORVASC) 10 MG tablet TAKE 1 TABLET BY MOUTH DAILY FOR HYPERTENSION (Patient taking differently: Take 10 mg by mouth once a day for hypertension) 30 tablet 3 01/24/2016 at 1200  . apixaban (ELIQUIS) 2.5 MG TABS tablet Take 1 tablet (2.5 mg total) by mouth 2 (two) times daily. 180 tablet 1 01/24/2016 at 0900  . atorvastatin (LIPITOR) 10 MG tablet Take 10 mg by mouth daily.   01/23/2016 at 1800  . b complex vitamins tablet Take 1 tablet by  mouth daily.    01/24/2016 at 0900  . bisacodyl (DULCOLAX) 10 MG suppository Place 1 suppository (10 mg total) rectally daily as needed for moderate constipation. 12 suppository 0 Taking  . calcium-vitamin D (OSCAL WITH D) 500-200 MG-UNIT per tablet Take 1 tablet by mouth daily with breakfast.    01/24/2016 at am  . cetirizine (ZYRTEC) 10 MG tablet Take 10 mg by mouth daily as needed for allergies or rhinitis.   Past Week at Unknown time  . chlorhexidine (PERIDEX) 0.12 % solution Use as directed 15 mLs in the mouth or throat 2 (two) times daily.   01/24/2016 at am  . diclofenac sodium (VOLTAREN) 1 % GEL Apply 2 g topically daily as needed (for pain).    Past Week at Unknown time  . feeding supplement (BOOST / RESOURCE BREEZE) LIQD Take 1 Container by mouth daily. (Patient taking differently: Take 1 Container by mouth 2 (two) times daily between meals. )   01/24/2016 at am  . fluticasone (FLONASE) 50 MCG/ACT nasal spray Place 2 sprays into  both nostrils daily. 16 g 3 01/24/2016 at am  . gabapentin (NEURONTIN) 300 MG capsule Take 2 tablets in the morning, Take 1 tablet at noon, Take 2 tablets at night for pain (Patient taking differently: Take 300-600 mg by mouth See admin instructions. 600 mg in the morning then 300 mg at noontime then 600 mg at night (for pain)) 150 capsule 1 01/24/2016 at 1200  . hydrALAZINE (APRESOLINE) 50 MG tablet Take 1 tablet (50 mg total) by mouth 3 (three) times daily. 120 tablet 0 01/24/2016 at 1200  . ipratropium-albuterol (DUONEB) 0.5-2.5 (3) MG/3ML SOLN Take 3 mLs by nebulization every 6 (six) hours as needed (sob). Use 3 times daily x 4 days then every 6 hours as needed. (Patient taking differently: Take 3 mLs by nebulization every 6 (six) hours as needed (for shortness of breath). ) 360 mL 4 Taking  . isosorbide mononitrate (IMDUR) 30 MG 24 hr tablet TAKE 1 TABLET (30 MG TOTAL) BY MOUTH DAILY.  2 01/24/2016 at 0900  . latanoprost (XALATAN) 0.005 % ophthalmic solution Place 1 drop into both eyes at bedtime. 2.5 mL 12 01/23/2016 at pm  . levothyroxine (SYNTHROID, LEVOTHROID) 137 MCG tablet Take 1 tablet (137 mcg total) by mouth daily before breakfast. 30 tablet 2 01/24/2016 at am  . Maltodextrin-Xanthan Gum (RESOURCE THICKENUP CLEAR) POWD Take 120 g by mouth as needed (use to thicken liquids.). (Patient taking differently: See admin instructions. USED TO THICKEN LIQUIDS) 20 Can 0 Past Week at Unknown time  . Multiple Vitamins-Minerals (MULTIVITAMIN PO) Take 1 tablet by mouth daily.   01/24/2016 at am  . Nutritional Supplements (FEEDING SUPPLEMENT, OSMOLITE 1.5 CAL,) LIQD Place 1,000 mLs into feeding tube daily. (Patient taking differently: Place 1,000 mLs into feeding tube daily. (at night for 12 hours)) 1000 mL 3 01/23/2016 at pm  . ondansetron (ZOFRAN) 4 MG tablet Take 1 tablet (4 mg total) by mouth every 8 (eight) hours as needed for nausea or vomiting. 20 tablet 0 Taking  . pantoprazole (PROTONIX) 40 MG  tablet TAKE 1 TABLET BY MOUTH TWICE A DAY (Patient taking differently: Take 40 mg by mouth two times a day) 60 tablet 5 01/24/2016 at am  . Polyethyl Glycol-Propyl Glycol 0.4-0.3 % SOLN Place 1 drop into both eyes 4 (four) times daily.    01/24/2016 at am  . polyethylene glycol powder (GLYCOLAX/MIRALAX) powder MIX 17GRAMS IN 8 OUNCES OF LIQUID AND DRINK TWICE DAILY AS  NEEDED (Patient taking differently: Mix 17 grams into 8 ounces of juice and drink one to two times a day as needed) 3162 g 1 Past Month at Unknown time  . RESTASIS 0.05 % ophthalmic emulsion USE 1 DROP INTO BOTH EYES TWICE DAILY 60 mL 0 01/24/2016 at am  . sertraline (ZOLOFT) 100 MG tablet Take one tablet by mouth once daily for depression (Patient taking differently: Take 100 mg by mouth daily. FOR DEPRESSION) 90 tablet 1 01/24/2016 at am  . simethicone (MYLICON) 80 MG chewable tablet Chew 80 mg by mouth 4 (four) times daily as needed for flatulence.   Taking  . sodium chloride 1 g tablet TAKE 1 TABLET (1 g) BY MOUTH THREE TIMES DAILY 90 tablet 2 01/24/2016 at 1200  . traMADol (ULTRAM) 50 MG tablet Take 50 mg by mouth daily as needed for moderate pain.    Past Week at Unknown time  . Water For Irrigation, Sterile (FREE WATER) SOLN Place 60 mLs into feeding tube every 6 (six) hours.   01/24/2016 at am  . NONFORMULARY OR COMPOUNDED ITEM Apply 1-2 g topically 4 (four) times daily. Peripheral Neuropathy cream: Bupivacaine 1% Doxepin 3% Gabapentin 6% Ibuprofen 3% Pentoxifylline 3% 120 each 2 NOT YET at NOT YET   Assessment: Shannon [name] is a 82 y.o. female on Apixaban PTA for Afib now with new DVT - last dose 10/24 at ~0930. Continues on heparin per pharmacy.   aPTT  therapeutic twice HL >2. CBC stable - Hgb 9.6, plt 406.  Goal of Therapy:  Heparin level 0.3-0.7 units/ml  APTT 66-102s Monitor platelets by anticoagulation protocol: Yes   Plan:  -Continue heparin gtt at 900 units/hr -Daily aPTT until HL correlation -F/U long-term AC  plans -Monitor CBC, s/sx bleeding  Georga Bora, PharmD Pager: [phone] 01/26/2016 9:27 PM

## 2016-01-26 NOTE — Progress Notes (Signed)
Patient Name: Shannon [name] Date of Encounter: 01/26/2016  Primary Cardiologist: Dr. Rhona Raider Problem List     Active Problems:   Gastric outlet obstruction   Asthma with allergic rhinitis   Acute on chronic diastolic heart failure (HCC)   Hypothyroidism due to medication   Atrial flutter (HCC)   Chronic diastolic CHF (congestive heart failure) ([place])     Subjective   States breathing has improved this morning, only on [place]. Converted to SR last night.  Inpatient Medications    Scheduled Meds: . acetaminophen  650 mg Oral BID  . amLODipine  10 mg Oral Daily  . atorvastatin  10 mg Oral q1800  . B-complex with vitamin C  1 tablet Oral Daily  . calcium-vitamin D  1 tablet Oral Q breakfast  . chlorhexidine  15 mL Mouth/Throat BID  . cycloSPORINE  1 drop Both Eyes BID  . feeding supplement  1 Container Oral Daily  . feeding supplement (OSMOLITE 1.5 CAL)  1,000 mL Per Tube Q24H  . fluticasone  2 spray Each Nare Daily  . free water  60 mL Per Tube Q6H  . furosemide  40 mg Intravenous BID  . gabapentin  300 mg Oral TID  . hydrALAZINE  50 mg Oral TID  . insulin aspart  0-15 Units Subcutaneous Q4H  . ipratropium-albuterol  3 mL Nebulization Q6H  . isosorbide mononitrate  30 mg Oral Daily  . latanoprost  1 drop Both Eyes QHS  . levothyroxine  137 mcg Oral QAC breakfast  . methylPREDNISolone (SOLU-MEDROL) injection  40 mg Intravenous Q12H  . mometasone-formoterol  2 puff Inhalation BID  . pantoprazole  40 mg Oral BID  . polyethylene glycol  17 g Oral BID  . polyvinyl alcohol  1 drop Both Eyes QID  . sertraline  100 mg Oral QHS  . sodium chloride flush  3 mL Intravenous Q12H  . sodium chloride flush  3 mL Intravenous Q12H   Continuous Infusions: . sodium chloride    . amiodarone 30 mg/hr (01/26/16 0335)  . heparin 1,000 Units/hr (01/25/16 2109)   PRN Meds: sodium chloride, acetaminophen, bisacodyl, levalbuterol, ondansetron (ZOFRAN) IV, ondansetron, RESOURCE  THICKENUP CLEAR, simethicone, sodium chloride flush, sodium chloride flush, traMADol   Vital Signs    Vitals:   01/26/16 0015 01/26/16 0242 01/26/16 0411 01/26/16 0729  BP: (!) 142/54 (!) 148/59 140/69 (!) 151/62  Pulse: 76 85 80 80  Resp: 16 20 12 20   Temp: 97.8 F (36.6 C)  98 F (36.7 C) 97.9 F (36.6 C)  TempSrc: Oral  Oral Oral  SpO2: 100% 100% 100% 100%  Weight:   145 lb 1.6 oz (65.8 kg)   Height:        Intake/Output Summary (Last 24 hours) at 01/26/16 0825 Last data filed at 01/26/16 0415  Gross per 24 hour  Intake           748.83 ml  Output             1975 ml  Net         -1226.17 ml   Filed Weights   01/24/16 1544 01/25/16 0517 01/26/16 0411  Weight: 149 lb 14.4 oz (68 kg) 151 lb 9.6 oz (68.8 kg) 145 lb 1.6 oz (65.8 kg)    Physical Exam    GEN: Older, frail AA female.   HEENT: Grossly normal.  Neck: Supple, no JVD, carotid bruits, or masses. Cardiac: RRR, 2/6 systolic murmurs, rubs, or gallops. No clubbing,  cyanosis, edema.  Radials/DP/PT 2+ and equal bilaterally.  Respiratory:  Diminished bilaterally, expiratory wheezing, R>L GI: Soft, nontender, nondistended, BS + x 4. G-tube in place. MS: no deformity or atrophy. Skin: warm and dry, no rash. 1+ LEE bilaterally. Neuro:  Strength and sensation are intact. Psych: AAOx3.  Normal affect.  Labs    CBC  Recent Labs  01/24/16 1633 01/25/16 0327  WBC 6.4 7.1  NEUTROABS 3.8  --   HGB 9.7* 9.6*  HCT 28.9* 28.1*  MCV 86.8 85.2  PLT 370 500   Basic Metabolic Panel  Recent Labs  01/24/16 1633 01/25/16 0327  NA 132* 131*  K 4.9 4.8  CL 100* 97*  CO2 21* 22  GLUCOSE 95 206*  BUN 44* 49*  CREATININE 1.51* 1.82*  CALCIUM 10.1 9.7   Liver Function Tests  Recent Labs  01/24/16 1633  AST 39  ALT 26  ALKPHOS 68  BILITOT 0.3  PROT 8.1  ALBUMIN 3.8   No results for input(s): LIPASE, AMYLASE in the last 72 hours. Cardiac Enzymes  Recent Labs  01/24/16 1633  TROPONINI <0.03     Telemetry    SR - Personally Reviewed  ECG    N/a- Personally Reviewed  Radiology    Dg Chest Port 1 View  Result Date: 01/24/2016 CLINICAL DATA:  Shortness of breath. EXAM: PORTABLE CHEST 1 VIEW COMPARISON:  11/09/2015. FINDINGS: Trachea is midline. Heart size stable. Thoracic aorta is calcified. Lungs are clear. No pleural fluid. Right hemidiaphragm is elevated. Degenerative changes are seen in the glenohumeral joints bilaterally. IMPRESSION: No acute findings. Electronically Signed   By: Lorin Picket M.D.   On: 01/24/2016 17:13    Cardiac Studies     Patient Profile     Shannon [name] is a 82 y.o. female with a history of paroxysmal atrial fibrillation on amiodarone and Eliquis as well as a history of hypothyroidism,bradycardia, non-obstructive coronary artery disease, type 2 diabetes, hypertension, cerebro-vascular disease and COPD/ashtma who was admitted from the office yesterday with atrial flutter and dyspnea.  Assessment & Plan    1. Atrial Flutter: Placed on IV amiodarone on admission. Converted to SR last night. Cardioversion for today has been cancelled. MD to determine when to convert to oral medication. Continue Eliquis 2.5mg  daily. (Asal Teas...Marland KitchenMarland KitchenLiver function is good. I will change her to amiodarone 400 mg twice a day for now.)  2. Acute on Chronic diastolic HF: Seemed to have been 2/2 atrial flutter. Has diuresed briskly with 3.8 L UOP total. Again required Bipap yesterday, PCCM consulted and transitioned to HFNC, now on [place] at 4L this morning.  -- IV lasix was reduced to 40mg  IV BID yesterday. Cr with slight increase 1.5>>1.82 yesterday. (Morning labs Now show that her creatinine has risen to 2.15. She does not appear to be wet. I have stopped her diuretics for today. Shannon [name])  3. Asthma: Likely with acute exacerbation on admission. Breathing has improved. Expiratory wheezing R>L today. Neb, and IV steroids. IM following and appreciate input.   4. H/o GOO s/p g-tube:  There was concern about pus around site, IM asked to assist with management. IR consulted and noted that tube was working properly. She only takes full liquids PO. Will adjust diet.   5. Hypothyroidism: Last TSH was 42 9/17, medications have been adjusted by PCP. TSH 8.7 yesterday.  6.  DVT LL extremity: Noted yesterday afternoon. Eliquis was held and placed on IV heparin per MD. Given she developed a DVT while on Eliquis, seems  she will need to be transitioned to different therapy. Coumadin does not seem to be ideal as her age and renal function are a factor.  7.  Acute renal insufficiency      Creatinine is now climbing rapidly. I have stopped diuretics....(Chozen Latulippe)  Signed, Reino Bellis, NP -C 01/26/2016, 8:25 AM  Patient seen and examined. I agree with the assessment and plan as detailed above. See also my additional thoughts below.   I have added my input to the problem list above. I have changed her to oral amiodarone. I put Lasix on hold.  Dola Argyle, MD, [facility] 01/26/2016 11:30 AM

## 2016-01-26 NOTE — Progress Notes (Signed)
Name: Shannon [name] MRN: [id number] DOB: 1924-03-08    ADMISSION DATE:  01/24/2016 CONSULTATION DATE:  01/24/16  REFERRING MD :  Dr. Delton See   CHIEF COMPLAINT:  Respiratory Failure    HISTORY OF PRESENT ILLNESS:  82 y/o F, never smoker (chewed tobacco) with PMH of anemia, arthritis, depression, GERD, gastric stenosis, PUD, ventral hernia s/p repair, colectomy,  hypothyroidism, DM II, CAD, HTN, HLD, PAF (on amiodarone / Eliquis), bradycardia, CVA, & asthma / COPD who presented to [facility] on 10/23 with reports of shortness of breath.    The patient was admitted to the hospital in August for bradycardia & weakness.  She was taken off her labetalol and amiodarone was reduced during that admit.    The patient reports she felt as though her asthma had been "acting up" with worsening shortness of breath despite increased inhaler use.  She reported variable heart rate - HR in the 50's then would increase to 110's.  She also had noticed progressive swelling in her lower extremities.  She was seen in the Cardiology office on 10/23 and found to be in rapid atrial flutter with O2 sats of 87%.  She was directly admitted from the Cardiology office for rate control.  The patient was started on IV amiodarone.  She initially improved but afternoon 10/24, she developed increased SOB / work of breathing and was placed on bipap.  ABG assessment demonstrated ph 7.622, undetectable CO2, and bicarbonate of 15.3.    SUBJECTIVE:  Breathing feels better but still SOB when trying to talk.  C/o constipation.  Denies chest pain, hemoptysis, leg pain.   VITAL SIGNS: Temp:  [97.2 F (36.2 C)-98 F (36.7 C)] 97.9 F (36.6 C) (10/25 0729) Pulse Rate:  [76-133] 77 (10/25 0837) Resp:  [12-23] 18 (10/25 0837) BP: (119-151)/(54-92) 151/62 (10/25 0837) SpO2:  [100 %] 100 % (10/25 0837) FiO2 (%):  [40 %] 40 % (10/24 1308) Weight:  [65.8 kg (145 lb 1.6 oz)] 65.8 kg (145 lb 1.6 oz) (10/25 0411)  PHYSICAL EXAMINATION: General:   Frail elderly female in NAD  Neuro:  Awake, alert, generalized weakness / MAE  HEENT:  MM pink/moist, no jvd  Cardiovascular:  s1s2 rrr, no m/r/g Lungs: non-labored, lungs bilaterally clear, does get obviously dyspneic when speaking Abdomen:  Soft, non-tender, bsx4 active  Musculoskeletal:  No acute deformities  Skin:  Warm/dry, no edema, rashes or lesions   Recent Labs Lab 01/24/16 1633 01/25/16 0327  NA 132* 131*  K 4.9 4.8  CL 100* 97*  CO2 21* 22  BUN 44* 49*  CREATININE 1.51* 1.82*  GLUCOSE 95 206*    Recent Labs Lab 01/24/16 1633 01/25/16 0327  HGB 9.7* 9.6*  HCT 28.9* 28.1*  WBC 6.4 7.1  PLT 370 387   Dg Chest Port 1 View  Result Date: 01/24/2016 CLINICAL DATA:  Shortness of breath. EXAM: PORTABLE CHEST 1 VIEW COMPARISON:  11/09/2015. FINDINGS: Trachea is midline. Heart size stable. Thoracic aorta is calcified. Lungs are clear. No pleural fluid. Right hemidiaphragm is elevated. Degenerative changes are seen in the glenohumeral joints bilaterally. IMPRESSION: No acute findings. Electronically Signed   By: Leanna Battles M.D.   On: 01/24/2016 17:13    SIGNIFICANT EVENTS  10/23  Admit with Atrial flutter  STUDIES:  BLE venous dopplers 10/24>>> There is evidence of deep vein thrombosis involving the left lower extremity. Deep vein thrombosis involves the popliteal vein, peroneal, and posterior tibial veins of the left lower extremity. 10/25 echo>>>  ASSESSMENT /  PLAN:  82 year old female with extensive PMH who presents to the hospital with irregular heart beat who was found to be in respiratory failure and BiPAP was started.  ABG was was done that revealed a pH of 7.622/too low to detect/188 on BiPAP.  DDx includes hyperthyroidism (TSH has been elevated, patient is on replacement and recently amiodarone was discontinued), anxiety, pain, PE and heart failure (most likely culprit).     Respiratory failure - multifactorial r/t acute on chronic dCHF, A-flutter +/-  PE.  Respiratory alkalosis --   - High flow nasal cannula as needed to keep sats >92%  - intermittent f/u CXR   - diuresis per cards   - rate control - now NSR, cardioversion cancelled   - anticoagulation per cards   - f/u ABG   - monitor closely on amiodarone although doubt that is culprit    ?PE - BLE venous dopplers did reveal DVT.  Failed eliquis.   - heparin gtt per pharmacy   - will need a different agent at d/c   - trend troponin, repeat echo to ensure no R heart strain  - no indication for CTA as will not change management at this point especially given her renal function   Hypothyroidism - improving.  TSH 9/17 was 42, now 8.7.   - Continue thyroxin    Dirk Dress, NP 01/26/2016  8:56 AM Pager: [phone] or [phone]

## 2016-01-26 NOTE — Progress Notes (Signed)
Patient placed on a regular 4LNC from a HFNC due to spo2 being 100%.

## 2016-01-26 NOTE — Consult Note (Signed)
[facility] CM Inpatient Consult   01/26/2016  Sherlean Salasar [birth year] [id number]   Patient was evaluated for services with [facility] Care Management.  Patient is eligible under her Medicare/ACO Registry.  Per medical record this is the patient's 5th admission in 6 months.  [facility] Care Management has been offered to the patient and daughter [name] on sereval occasions. Came by to see patient and no one was available to speak to.  Patient provided appropriate HIPAA with DOB.  Patient is quite short of breath.  She states her daughter [name] is her POA and assist her with decisions.  Spoke with inpatient regarding barriers for follow up in previous times.  She states the daughter is to come in after lunch time.  This Clinical research associate left a brochure and contact information at the bedside.  For questions or referrals, please contact:  Charlesetta Shanks, RN BSN CCM [facility]  [phone] business mobile phone Toll free office [phone]

## 2016-01-26 NOTE — Progress Notes (Addendum)
Initial Nutrition Assessment  DOCUMENTATION CODES:   Non-severe (moderate) malnutrition in context of chronic illness  INTERVENTION:    Continue current nocturnal tube feeding and full liquid diet to meet nutrition needs.  NUTRITION DIAGNOSIS:   Inadequate oral intake related to altered GI function as evidenced by patient report, need for GJ tube and only full liquids PO.  GOAL:   Patient will meet greater than or equal to 90% of their needs  MONITOR:   PO intake, TF tolerance, Skin, I & O's, Labs  REASON FOR ASSESSMENT:   Other (Comment) (New TF)    ASSESSMENT:   82 y.o. female  With PMHx of a fib, asthma, hypothyroid who was directly admitted from the cardiology office for shortness of breath. Recent diagnosis of gastric outlet obstruction.  GJ tube placed by general surgery on 6/22.   Patient reports that her daughter starts her TF at ~8:00 pm and it runs through 7:30-8 am. She is unsure of the name of the TF formula, but thinks she receives 3 cans. She also flushes the tube with water 3 times per day. She eats small amounts throughout the day. From dietary recall, suspect patient is taking ~ one-third to one-half of her calorie needs PO.  Patient is currently receiving Osmolite 1.5 via GJ tube at 50 ml/h x 12 hours per day (600 ml/day) to provide 900 kcals, 38 gm protein, 457 ml free water daily. Provides 62% of estimated kcal needs and 50% of estimated protein needs. Also receiving 60 ml free water QID.   Also drinking some full liquids, including Boost Breeze once daily.  Nutrition-Focused physical exam completed. Findings are mild-moderate fat depletion, mild-moderate muscle depletion, and no edema.  Patient with moderate PCM.  Labs reviewed: sodium low. CBG's: [phone] Medications reviewed and include B-complex vitamin with vitamin C, Oscal with D, lasix, insulin, solumedrol, miralax  Diet Order:  Diet full liquid Room service appropriate? Yes; Fluid  consistency: Thin  Skin:  Wound (see comment) (stg 1 heels; stg 2 toe)  Last BM:  10/23  Height:   Ht Readings from Last 1 Encounters:  01/24/16 5\' 6"  (1.676 m)    Weight:   Wt Readings from Last 1 Encounters:  01/26/16 145 lb 1.6 oz (65.8 kg)    Ideal Body Weight:  59.1 kg  BMI:  Body mass index is 23.42 kg/m.  Estimated Nutritional Needs:   Kcal:  1448-1856  Protein:  75-90 gm  Fluid:  1.5 L  EDUCATION NEEDS:   No education needs identified at this time  Molli Barrows, [place], [place], [place] Pager [phone] After Hours Pager [phone]

## 2016-01-26 NOTE — Consult Note (Signed)
[place] NOTE  Patient Care Team: Gildardo Cranker, DO as PCP - General (Internal Medicine) Riccardo Dubin, MD as Referring Physician (Cardiology) Alveta Heimlich, MD as Referring Physician (Ophthalmology)  CHIEF COMPLAINTS/PURPOSE OF CONSULTATION:  DVT while on Eliquis  HISTORY OF PRESENTING ILLNESS:  Shannon [name] 82 y.o. female is here because of recent diagnosis of left lower extremity DVT involving the popliteal vein, peroneal and posterior tibial veins.  Patient was previously on Eliquis for paroxysmal atrial fibrillation. She has been admitted to the hospital with the shortness of breath respiratory distress bradycardia and generalized weakness. Because she was complaining of leg swelling ultrasound of the lower extremities were performed.   On interviewing the patient, she reports that her breathing has improved slightly. She does have dementia and relies on her daughter for all her decisions. She denies any chest pain.  I reviewed her records extensively and collaborated the history with the patient.  MEDICAL HISTORY:  Past Medical History:  Diagnosis Date  . Anemia    previous blood transfusions  . Arthritis    "all over"  . Asthma   . Bradycardia    topped BB and amiodarone decreased- junctional rhythm   . COPD (chronic obstructive pulmonary disease) ([place])   . Depression    "light case"  . Gastric stenosis   . GERD (gastroesophageal reflux disease)   . History of stomach ulcers   . Hyperlipidemia   . Hypertension   . Hypothyroidism   . Paraesophageal hernia   . Perforated gastric ulcer ([place])   . Pneumonia   . Renal insufficiency   . SIADH (syndrome of inappropriate ADH production) ([place])    [name] 01/10/2015  . Small bowel obstruction    "I don't know how many" (01/11/2015)  . Stroke ([place])    "light one"  . Type II diabetes mellitus ([place])   . Ventral hernia with bowel obstruction     SURGICAL HISTORY: Past Surgical History:   Procedure Laterality Date  . CATARACT EXTRACTION W/ INTRAOCULAR LENS  IMPLANT, BILATERAL    . CHOLECYSTECTOMY OPEN    . COLECTOMY    . ESOPHAGOGASTRODUODENOSCOPY N/A 01/19/2014   Procedure: ESOPHAGOGASTRODUODENOSCOPY (EGD);  Surgeon: Irene Shipper, MD;  Location: [facility];  Service: Endoscopy;  Laterality: N/A;  . ESOPHAGOGASTRODUODENOSCOPY N/A 01/20/2014   Procedure: ESOPHAGOGASTRODUODENOSCOPY (EGD);  Surgeon: Irene Shipper, MD;  Location: [facility];  Service: Endoscopy;  Laterality: N/A;  . ESOPHAGOGASTRODUODENOSCOPY N/A 03/19/2014   Procedure: ESOPHAGOGASTRODUODENOSCOPY (EGD);  Surgeon: Milus Banister, MD;  Location: [facility];  Service: Endoscopy;  Laterality: N/A;  . ESOPHAGOGASTRODUODENOSCOPY N/A 07/08/2015   Procedure: ESOPHAGOGASTRODUODENOSCOPY (EGD);  Surgeon: Doran Stabler, MD;  Location[facility] ENDOSCOPY;  Service: Endoscopy;  Laterality: N/A;  . ESOPHAGOGASTRODUODENOSCOPY (EGD) WITH PROPOFOL N/A 09/15/2015   Procedure: ESOPHAGOGASTRODUODENOSCOPY (EGD) WITH PROPOFOL;  Surgeon: Manus Gunning, MD;  [facility];  Service: Gastroenterology;  Laterality: N/A;  . GASTROJEJUNOSTOMY     hx/notes 01/10/2015  . GASTROJEJUNOSTOMY N/A 09/23/2015   Procedure: OPEN GASTROJEJUNOSTOMY TUBE PLACEMENT;  Surgeon: Arta Bruce Kinsinger, MD;  [facility];  Service: General;  Laterality: N/A;  . HERNIA REPAIR  2015  . IR GENERIC HISTORICAL  01/07/2016   IR GJ TUBE CHANGE 01/07/2016 Jacqulynn Cadet, MD [facility]  . LAPAROTOMY N/A 01/20/2015   Procedure: EXPLORATORY LAPAROTOMY;  Surgeon: Coralie Keens, MD;  Location: [facility];  Service: General;  Laterality: N/A;  . LYSIS OF ADHESION N/A 01/20/2015   Procedure: LYSIS  OF ADHESIONS < 1 HOUR;  Surgeon: Coralie Keens, MD;  Location: [facility];  Service: General;  Laterality: N/A;  . TUBAL LIGATION    . VENTRAL HERNIA REPAIR  2015   incarcerated ventral hernia ([facility] 09/2013)/notes 01/10/2015    SOCIAL HISTORY: Social History    Social History  . Marital status: Widowed    Spouse name: N/A  . Number of children: 3  . Years of education: N/A   Occupational History  . Not on file.   Social History Main Topics  . Smoking status: Never Smoker  . Smokeless tobacco: Former Systems developer    Types: Snuff  . Alcohol use No  . Drug use: No  . Sexual activity: No   Other Topics Concern  . Not on file   Social History Narrative   Married in [place], lives in one story house with 2 people and no pets   Occupation: ?   Has a living Will, [place], and doesn't have DNR   Was living with Husband (in frail health) in [place] until 09/2013.  After her [facility] admission they both came to live with daughter in [place]: Family History  Problem Relation Age of Onset  . Stroke Mother   . Hypertension Mother   . Diabetes Brother   . Heart attack Neg Hx     ALLERGIES:  has No Known Allergies.  MEDICATIONS:  Current Facility-Administered Medications  Medication Dose Route Frequency Provider Last Rate Last Dose  . 0.9 %  sodium chloride infusion  250 mL Intravenous PRN Imogene Burn, PA-C      . 0.9 %  sodium chloride infusion  250 mL Intravenous Continuous Dorothy Spark, MD      . acetaminophen (TYLENOL) tablet 650 mg  650 mg Oral BID Imogene Burn, PA-C   650 mg at 01/25/16 2121  . acetaminophen (TYLENOL) tablet 650 mg  650 mg Oral Q4H PRN Imogene Burn, PA-C      . amiodarone (NEXTERONE PREMIX) 360-4.14 MG/200ML-% (1.8 mg/mL) IV infusion  30 mg/hr Intravenous Continuous Imogene Burn, PA-C 16.7 mL/hr at 01/26/16 0335 30 mg/hr at 01/26/16 0335  . amLODipine (NORVASC) tablet 10 mg  10 mg Oral Daily Imogene Burn, PA-C   10 mg at 01/25/16 0930  . atorvastatin (LIPITOR) tablet 10 mg  10 mg Oral q1800 Imogene Burn, PA-C   10 mg at 01/25/16 1722  . B-complex with vitamin C tablet 1 tablet  1 tablet Oral Daily Dorothy Spark, MD   1 tablet at 01/25/16 0930  . bisacodyl (DULCOLAX) suppository 10  mg  10 mg Rectal Daily PRN Imogene Burn, PA-C      . calcium-vitamin D (OSCAL WITH D) 500-200 MG-UNIT per tablet 1 tablet  1 tablet Oral Q breakfast Imogene Burn, PA-C   1 tablet at 01/25/16 0930  . chlorhexidine (PERIDEX) 0.12 % solution 15 mL  15 mL Mouth/Throat BID Imogene Burn, PA-C   15 mL at 01/25/16 2117  . cycloSPORINE (RESTASIS) 0.05 % ophthalmic emulsion 1 drop  1 drop Both Eyes BID Imogene Burn, PA-C   1 drop at 01/25/16 2130  . feeding supplement (BOOST / RESOURCE BREEZE) liquid 1 Container  1 Container Oral Daily Dorothy Spark, MD   1 Container at 01/25/16 0940  . feeding supplement (OSMOLITE 1.5 CAL) liquid 1,000 mL  1,000 mL Per Tube Q24H Geradine Girt, DO   Stopped at 01/26/16  0001  . fluticasone (FLONASE) 50 MCG/ACT nasal spray 2 spray  2 spray Each Nare Daily Dorothy Spark, MD   2 spray at 01/25/16 [phone]  . free water 60 mL  60 mL Per Tube Q6H Imogene Burn, PA-C   60 mL at 01/26/16 1308  . furosemide (LASIX) injection 40 mg  40 mg Intravenous BID Dayna N Dunn, PA-C   40 mg at 01/25/16 1723  . gabapentin (NEURONTIN) capsule 300 mg  300 mg Oral TID Imogene Burn, PA-C   300 mg at 01/25/16 2119  . heparin ADULT infusion 100 units/mL (25000 units/253mL sodium chloride 0.45%)  1,000 Units/hr Intravenous Continuous Dorothy Spark, MD 10 mL/hr at 01/25/16 2109 1,000 Units/hr at 01/25/16 2109  . hydrALAZINE (APRESOLINE) tablet 50 mg  50 mg Oral TID Imogene Burn, PA-C   50 mg at 01/25/16 2118  . insulin aspart (novoLOG) injection 0-15 Units  0-15 Units Subcutaneous Q4H Geradine Girt, DO   5 Units at 01/26/16 0433  . ipratropium-albuterol (DUONEB) 0.5-2.5 (3) MG/3ML nebulizer solution 3 mL  3 mL Nebulization Q6H Jessica U Vann, DO   3 mL at 01/26/16 0240  . isosorbide mononitrate (IMDUR) 24 hr tablet 30 mg  30 mg Oral Daily Dorothy Spark, MD   30 mg at 01/25/16 0930  . latanoprost (XALATAN) 0.005 % ophthalmic solution 1 drop  1 drop Both Eyes QHS Imogene Burn, PA-C   1 drop at 01/25/16 2128  . levalbuterol (XOPENEX) nebulizer solution 0.63 mg  0.63 mg Nebulization Q6H PRN Geradine Girt, DO      . levothyroxine (SYNTHROID, LEVOTHROID) tablet 137 mcg  137 mcg Oral QAC breakfast Imogene Burn, PA-C   137 mcg at 01/25/16 6578  . methylPREDNISolone sodium succinate (SOLU-MEDROL) 40 mg/mL injection 40 mg  40 mg Intravenous Q12H Jessica U Vann, DO   40 mg at 01/26/16 0434  . mometasone-formoterol (DULERA) 200-5 MCG/ACT inhaler 2 puff  2 puff Inhalation BID Imogene Burn, PA-C   2 puff at 01/25/16 1942  . ondansetron (ZOFRAN) injection 4 mg  4 mg Intravenous Q6H PRN Imogene Burn, PA-C      . ondansetron [facility]) tablet 4 mg  4 mg Oral Q8H PRN Imogene Burn, PA-C      . pantoprazole (PROTONIX) EC tablet 40 mg  40 mg Oral BID Imogene Burn, PA-C   40 mg at 01/25/16 2119  . polyethylene glycol (MIRALAX / GLYCOLAX) packet 17 g  17 g Oral BID Dorothy Spark, MD   17 g at 01/25/16 2120  . polyvinyl alcohol (LIQUIFILM TEARS) 1.4 % ophthalmic solution 1 drop  1 drop Both Eyes QID Imogene Burn, PA-C   1 drop at 01/25/16 2129  . RESOURCE THICKENUP CLEAR 120 g  1 Can Oral PRN Imogene Burn, PA-C      . sertraline (ZOLOFT) tablet 100 mg  100 mg Oral QHS Imogene Burn, PA-C   100 mg at 01/25/16 2118  . simethicone (MYLICON) chewable tablet 80 mg  80 mg Oral QID PRN Imogene Burn, PA-C      . sodium chloride flush (NS) 0.9 % injection 3 mL  3 mL Intravenous Q12H Imogene Burn, PA-C      . sodium chloride flush (NS) 0.9 % injection 3 mL  3 mL Intravenous PRN Imogene Burn, PA-C      . sodium chloride flush (NS) 0.9 % injection 3 mL  3 mL Intravenous Q12H Dorothy Spark, MD   3 mL at 01/25/16 2100  . sodium chloride flush (NS) 0.9 % injection 3 mL  3 mL Intravenous PRN Dorothy Spark, MD      . traMADol Veatrice Bourbon) tablet 50 mg  50 mg Oral Q6H PRN Dorothy Spark, MD   50 mg at 01/25/16 1730    REVIEW OF SYSTEMS:   Constitutional:  Denies fevers, chills or abnormal night sweats Eyes: Denies blurriness of vision, double vision or watery eyes Ears, nose, mouth, throat, and face: Denies mucositis or sore throat Respiratory: Shortness of breath to exertion cardiovascular: Denies palpitation, chest discomfort or lower extremity swelling Gastrointestinal:  Denies nausea, heartburn or change in bowel habits Skin: Denies abnormal skin rashes Lymphatics: Denies new lymphadenopathy or easy bruising Neurological:Denies numbness, tingling or new weaknesses Behavioral/Psych: Mood is stable, no new changes   All other systems were reviewed with the patient and are negative.  PHYSICAL EXAMINATION: ECOG PERFORMANCE STATUS: 1 - Symptomatic but completely ambulatory  Vitals:   01/26/16 0411 01/26/16 0729  BP: 140/69 (!) 151/62  Pulse: 80 80  Resp: 12 20  Temp: 98 F (36.7 C) 97.9 F (36.6 C)   Filed Weights   01/24/16 1544 01/25/16 0517 01/26/16 0411  Weight: 149 lb 14.4 oz (68 kg) 151 lb 9.6 oz (68.8 kg) 145 lb 1.6 oz (65.8 kg)    GENERAL:alert, no distress and comfortable SKIN: skin color, texture, turgor are normal, no rashes or significant lesions EYES: normal, conjunctiva are pink and non-injected, sclera clear OROPHARYNX:no exudate, no erythema and lips, buccal mucosa, and tongue normal  NECK: supple, thyroid normal size, non-tender, without nodularity LYMPH:  no palpable lymphadenopathy in the cervical, axillary or inguinal LUNGS: Dim BS HEART: regular rate & rhythm ABDOMEN:abdomen soft, non-tender and normal bowel sounds Musculoskeletal:no cyanosis of digits and no clubbing  PSYCH: alert & oriented x 3 with fluent speech NEURO: no focal motor/sensory deficits  LABORATORY DATA:  I have reviewed the data as listed Lab Results  Component Value Date   WBC 7.1 01/25/2016   HGB 9.6 (L) 01/25/2016   HCT 28.1 (L) 01/25/2016   MCV 85.2 01/25/2016   PLT 387 01/25/2016   Lab Results  Component Value Date   NA  131 (L) 01/25/2016   K 4.8 01/25/2016   CL 97 (L) 01/25/2016   CO2 22 01/25/2016    ASSESSMENT AND PLAN:  1. Left lower extremity DVT: Patient had been on Eliquis and in spite of that she developed blood clot. I am not certain if there is any component of compliance issue because of her dementia. Currently on heparin drip.   because of her elderly age, although ideally we should treat her with Lovenox injections, I did not recommend it. Coumadin is another option however kidneys to be monitored and it would be difficult at her age to monitor her regularly and frequently. So based upon risks and benefits, I recommended Xarelto. Tomorrow please switch her from heparin to Xarelto and discharge her home with Xarelto. I will discuss this plan with the patient's daughter.   2. shortness of breath currently off BiPAP We are happy to follow her in our clinic upon discharge in approximately a month.  All questions were answered. The patient knows to call the clinic with any problems, questions or concerns.    Rulon Eisenmenger, MD @T @

## 2016-01-26 NOTE — Evaluation (Signed)
Clinical/Bedside Swallow Evaluation Patient Details  Name: Shannon [name] MRN: [id number] Date of Birth: 12/13/1923  Today's Date: 01/26/2016 Time: SLP Start Time (ACUTE ONLY): 0845 SLP Stop Time (ACUTE ONLY): 0908 SLP Time Calculation (min) (ACUTE ONLY): 23 min  Past Medical History:  Past Medical History:  Diagnosis Date  . Anemia    previous blood transfusions  . Arthritis    "all over"  . Asthma   . Bradycardia    topped BB and amiodarone decreased- junctional rhythm   . COPD (chronic obstructive pulmonary disease) ([facility])   . Depression    "light case"  . Gastric stenosis   . GERD (gastroesophageal reflux disease)   . History of stomach ulcers   . Hyperlipidemia   . Hypertension   . Hypothyroidism   . Paraesophageal hernia   . Perforated gastric ulcer ([facility])   . Pneumonia   . Renal insufficiency   . SIADH (syndrome of inappropriate ADH production) ([facility])    [name] 01/10/2015  . Small bowel obstruction    "I don't know how many" (01/11/2015)  . Stroke ([facility])    "light one"  . Type II diabetes mellitus ([facility])   . Ventral hernia with bowel obstruction    Past Surgical History:  Past Surgical History:  Procedure Laterality Date  . CATARACT EXTRACTION W/ INTRAOCULAR LENS  IMPLANT, BILATERAL    . CHOLECYSTECTOMY OPEN    . COLECTOMY    . ESOPHAGOGASTRODUODENOSCOPY N/A 01/19/2014   Procedure: ESOPHAGOGASTRODUODENOSCOPY (EGD);  Surgeon: Hilarie Fredrickson, MD;  Location: [facility];  Service: Endoscopy;  Laterality: N/A;  . ESOPHAGOGASTRODUODENOSCOPY N/A 01/20/2014   Procedure: ESOPHAGOGASTRODUODENOSCOPY (EGD);  Surgeon: Hilarie Fredrickson, MD;  Location: [facility];  Service: Endoscopy;  Laterality: N/A;  . ESOPHAGOGASTRODUODENOSCOPY N/A 03/19/2014   Procedure: ESOPHAGOGASTRODUODENOSCOPY (EGD);  Surgeon: Rachael Fee, MD;  Location: [facility];  Service: Endoscopy;  Laterality: N/A;  . ESOPHAGOGASTRODUODENOSCOPY N/A 07/08/2015   Procedure: ESOPHAGOGASTRODUODENOSCOPY (EGD);   Surgeon: Sherrilyn Rist, MD;  Location: [facility] ENDOSCOPY;  Service: Endoscopy;  Laterality: N/A;  . ESOPHAGOGASTRODUODENOSCOPY (EGD) WITH PROPOFOL N/A 09/15/2015   Procedure: ESOPHAGOGASTRODUODENOSCOPY (EGD) WITH PROPOFOL;  Surgeon: Ruffin Frederick, MD;  [facility];  Service: Gastroenterology;  Laterality: N/A;  . GASTROJEJUNOSTOMY     hx/notes 01/10/2015  . GASTROJEJUNOSTOMY N/A 09/23/2015   Procedure: OPEN GASTROJEJUNOSTOMY TUBE PLACEMENT;  Surgeon: De Blanch Kinsinger, MD;  [facility];  Service: General;  Laterality: N/A;  . HERNIA REPAIR  2015  . IR GENERIC HISTORICAL  01/07/2016   IR GJ TUBE CHANGE 01/07/2016 Malachy Moan, MD [facility]  . LAPAROTOMY N/A 01/20/2015   Procedure: EXPLORATORY LAPAROTOMY;  Surgeon: Abigail Miyamoto, MD;  Location: [facility] OR;  Service: General;  Laterality: N/A;  . LYSIS OF ADHESION N/A 01/20/2015   Procedure: LYSIS OF ADHESIONS < 1 HOUR;  Surgeon: Abigail Miyamoto, MD;  Location: [facility] OR;  Service: General;  Laterality: N/A;  . TUBAL LIGATION    . VENTRAL HERNIA REPAIR  2015   incarcerated ventral hernia ([facility] 09/2013)/notes 01/10/2015   HPI:  Audyn Smithis an 82 y.o.femaleWith PMHx of a fib, asthma, hypothyroid who was directly admitted from the cardiology office for shortness of breath. She was found to be in a flutter- rapid. Her O2 sats were 87% in the office on room air. Patient has h/o asthma and has been using her inhalers without relief for the last few days. She has increased LE edema as well. When patient arrived in the hospital, she was tachypneic  using accessory muscles and having increased shortness of breath. She was started on IV amio, placed on Bipap and given a dose of IV lasix with good UOP. Hospitalist were consulted to help with wheezing/asthma exacerbation and possible infected G tube.Patient has has 5 hospitalizations in the last 6 months. She was diagnosed with gastric outlet obstruction. She had GJ tube  placed by general surgery on 6/22. IR changed the tube on 10/6 and stated: the tip of the J arm may again migrate into the fundus of the stomach. If this happens, replacement should be either with a modified 22 French gastrojejunostomy tube which has been cut shorter (approximately 10-15 cm) or a shorter direct percutaneous jejunostomy tube given that the family really uses the gastrostomy portion. Pt currently on full liquid diet at home with medicine taken whole without any reported issue.    Assessment / Plan / Recommendation Clinical Impression  At baseline, pt consumes full thin liquid diet with additional nurtitional supplemented thru Jtube. Pt consumed thin liquids via cup without overt s/s of aspriation. Additonally pt consumed medicine during this evaluation without any difficulty. Recommend pt continue full liquid diet. ST signing off as pt is at baseline. Pt and nursing in agreement.     Aspiration Risk  Mild aspiration risk    Diet Recommendation  (Full thin liquid diet, continue supplemental feedings)   Liquid Administration via: Cup Medication Administration: Whole meds with liquid Supervision: Staff to assist with self feeding Postural Changes: Seated upright at 90 degrees;Remain upright for at least 30 minutes after po intake    Other  Recommendations Oral Care Recommendations: Oral care BID   Follow up Recommendations None       Swallow Study   General Date of Onset: 01/24/16 HPI: Shannon Smithis an 82 y.o.femaleWith PMHx of a fib, asthma, hypothyroid who was directly admitted from the cardiology office for shortness of breath. She was found to be in a flutter- rapid. Her O2 sats were 87% in the office on room air. Patient has h/o asthma and has been using her inhalers without relief for the last few days. She has increased LE edema as well. When patient arrived in the hospital, she was tachypneic using accessory muscles and having increased shortness of breath. She  was started on IV amio, placed on Bipap and given a dose of IV lasix with good UOP. Hospitalist were consulted to help with wheezing/asthma exacerbation and possible infected G tube.Patient has has 5 hospitalizations in the last 6 months. She was diagnosed with gastric outlet obstruction. She had GJ tube placed by general surgery on 6/22. IR changed the tube on 10/6 and stated: the tip of the J arm may again migrate into the fundus of the stomach. If this happens, replacement should be either with a modified 22 French gastrojejunostomy tube which has been cut shorter (approximately 10-15 cm) or a shorter direct percutaneous jejunostomy tube given that the family really uses the gastrostomy portion. Pt currently on full liquid diet at home with medicine taken whole without any reported issue.  Type of Study: Bedside Swallow Evaluation Previous Swallow Assessment: BSE on 11/07/2015 with no overt s/s of aspiration noted with full thin liquid diet. Recommendation was to continue full liquid, meds crushed in puree, intermittent supervision and remain uprigh 30to60 minutes after meals.  Diet Prior to this Study: Thin liquids;PEG tube Temperature Spikes Noted: No Respiratory Status: Nasal cannula (4liters) History of Recent Intubation: No Behavior/Cognition: Alert;Cooperative;Pleasant mood Oral Cavity Assessment: Within Functional Limits Oral  Care Completed by SLP: No Oral Cavity - Dentition: Edentulous (natural teeth on top) Vision: Functional for self-feeding Self-Feeding Abilities: Needs assist Patient Positioning: Upright in bed Baseline Vocal Quality: Hoarse Volitional Cough: Strong Volitional Swallow: Able to elicit    Oral/Motor/Sensory Function Overall Oral Motor/Sensory Function: Within functional limits   Ice Chips Ice chips: Within functional limits   Thin Liquid Thin Liquid: Within functional limits Presentation: Cup;Self Fed    Nectar Thick Nectar Thick Liquid: Not tested   Honey  Thick Honey Thick Liquid: Not tested   Puree Puree: Not tested   Solid   GO   Solid: Not tested       Jericha Bryden B. Dreama Saa M.S., CCC-SLP Speech-Language Pathologist  Keevin Panebianco 01/26/2016,9:08 AM

## 2016-01-26 NOTE — Care Management Note (Addendum)
Case Management Note  Patient Details  Name: Shannon [name] MRN: [id number] Date of Birth: July 24, 1923  Subjective/Objective:    Pt presented for SOB & BLE edema. Pt found to be in Atrial Flutter with RVR. Initiated on IV Amio gtt.  Positive for DVT on IV Heparin gtt. Pt is from home with support of daughter.                  Action/Plan: Consult received for Xarelto: Medication is covered, No prior Josem Kaufmann is needed, co pay for a 30 supply at the pharmacy is 88.50 for a 90 day supply thur mail order is 80.00, 90 day supply at the pharmacy is 265.27. CM will make family aware and provide pt with a 30 day free card. CM will continue to monitor for disposition needs. Pt will benefit from PT/OT consult once medically stable to ambulate.    Expected Discharge Date:                  Expected Discharge Plan:  [place]  In-House Referral:     Discharge planning Services  CM Consult  Post Acute Care Choice:   Home Health Choice offered to:    Patient, Adult Children  DME Arranged:   N/A DME Agency:   N/A  HH Arranged:   RN, PT [place] Agency:    [place] Status of Service:  Completed If discussed at Long Length of Stay Meetings, dates discussed: 02-01-16   Additional Comments: 1419 01-31-16 Jacqlyn Krauss, RN,RN,BSN [phone] CM did speak with pt/ daughter in regards to disposition needs. Plan will be to return home once stable. Pt was previously active with [facility]. CM did make additional referral with [facility] for [facility] [place]. No further needs from CM at this time.  Bethena Roys, RN 01/26/2016, 12:10 PM

## 2016-01-26 NOTE — Progress Notes (Signed)
Inpatient Diabetes Program Recommendations  AACE/ADA: New Consensus Statement on Inpatient Glycemic Control (2015)  Target Ranges:  Prepandial:   less than 140 mg/dL      Peak postprandial:   less than 180 mg/dL (1-2 hours)      Critically ill patients:  140 - 180 mg/dL   Results for VIDEL, NOBREGA (MRN [id number]) as of 01/26/2016 10:07  Ref. Range 01/25/2016 07:27 01/25/2016 11:15 01/25/2016 16:38 01/25/2016 20:07  Glucose-Capillary Latest Ref Range: 65 - 99 mg/dL 241 (H) 227 (H) 203 (H) 185 (H)   Results for PATICIA, MOSTER (MRN [id number]) as of 01/26/2016 10:07  Ref. Range 01/26/2016 00:07 01/26/2016 04:09 01/26/2016 07:28  Glucose-Capillary Latest Ref Range: 65 - 99 mg/dL 235 (H) 202 (H) 165 (H)    Admit with: SOB/ A Flutter  History: DM, COPD  Home DM Meds: None listed  Current Insulin Orders: Novolog Moderate Correction Scale/ SSI (0-15 units) Q4 hours       Note patient currently getting Solumedrol 40 mg BID.  Also getting Osmolite tube feeds at 50cc/hour.  Note Novolog Moderate SSI started yesterday at 12pm.     MD- If patient continues to have glucose levels >200 mg/dl while getting steroids, please consider the following:  Start Novolog tube feed coverage: Novolog 3 units Q4 hours to be given while tube feeds are running      --Will follow patient during hospitalization--  Wyn Quaker RN, MSN, CDE Diabetes Coordinator Inpatient Glycemic Control Team Team Pager: [phone] (8a-5p)

## 2016-01-26 NOTE — Progress Notes (Addendum)
PROGRESS NOTE    Shannon [name]  KZS:[id number] DOB: 1923/07/04 DOA: 01/24/2016 PCP: Kirt Boys, DO   Outpatient Specialists:     Brief Narrative:  Shannon [name] is an 82 y.o. female  With PMHx of a fib, asthma, hypothyroid who was directly admitted from the cardiology office for shortness of breath.  She was found to be in a flutter- rapid.  Her O2 sats were 87% in the office on room air.  Patient has h/o asthma and has been using her inhalers without relief for the last few days.   She has increased LE edema as well.   When patient arrived in the hospital, she was tachypneic using accessory muscles and having increased shortness of breath.  She was started on IV amio, placed on Bipap and given a dose of IV lasix with good UOP.  Hospitalist were consulted to help with wheezing/asthma exacerbation and possible infected G tube. G tube was examined by IR and found to be in good working order.   Patient converted back to SR on own night of 10/24.     Assessment & Plan:   Active Problems:   Gastric outlet obstruction   Asthma with allergic rhinitis   Acute on chronic diastolic heart failure (HCC)   Hypothyroidism due to medication   Atrial flutter (HCC)   Chronic diastolic CHF (congestive heart failure) (HCC)   Acute renal insufficiency   A flutter uncontrolled leading to acute on chronic diastolic HF- patient does not tolerate well -converted to SR last PM - amiodarone per cards -cardioversion cancelled  Gastric outlet obstruction with recent g tube placement at [place] - patient just had tube replaced by IR on 10/5-- has granulation tissue- appreciate IR -restart tube feeds  -per daughter takes clears (from chart, appears to be nectar consistency)  Asthma -patient was wheezing but resolved with IV lasix (suspect related to volume overload and atrial flutter) -patient did c/o chest tightness and cough-- wean IV Steroids to off tomm -nebs scheduled and  PRN  Hypothyroidism -continue home meds -outpatient repeat of TSH/free t4  DVT on eliquis (25 mg dose) -per oncology change to xarelto upon d/c  DVT prophylaxis:  Heparin gtt  Code Status: partial    Subjective: Feels better now that she is in sinus rhythm  Objective: Vitals:   01/26/16 0729 01/26/16 0837 01/26/16 1035 01/26/16 1135  BP: (!) 151/62 (!) 151/62 (!) 145/62 (!) 151/94  Pulse: 80 77 82 89  Resp: 20 18 (!) 24 14  Temp: 97.9 F (36.6 C)   97.8 F (36.6 C)  TempSrc: Oral   Oral  SpO2: 100% 100% 100% 100%  Weight:      Height:        Intake/Output Summary (Last 24 hours) at 01/26/16 1248 Last data filed at 01/26/16 0415  Gross per 24 hour  Intake           748.83 ml  Output             1975 ml  Net         -1226.17 ml   Filed Weights   01/24/16 1544 01/25/16 0517 01/26/16 0411  Weight: 68 kg (149 lb 14.4 oz) 68.8 kg (151 lb 9.6 oz) 65.8 kg (145 lb 1.6 oz)    Examination:  General exam: breathing less labored today Respiratory system: diminshed, no wheezing Cardiovascular system: rrr Gastrointestinal system: GJ tube in place Central nervous system: Alert and oriented. No focal neurological deficits.    Data  Reviewed: I have personally reviewed following labs and imaging studies  CBC:  Recent Labs Lab 01/24/16 1633 01/25/16 0327 01/26/16 0836  WBC 6.4 7.1 12.4*  NEUTROABS 3.8  --   --   HGB 9.7* 9.6* 9.6*  HCT 28.9* 28.1* 27.3*  MCV 86.8 85.2 83.2  PLT 370 387 406*   Basic Metabolic Panel:  Recent Labs Lab 01/24/16 1633 01/25/16 0327 01/26/16 0836  NA 132* 131* 132*  K 4.9 4.8 4.3  CL 100* 97* 95*  CO2 21* 22 20*  GLUCOSE 95 206* 169*  BUN 44* 49* 69*  CREATININE 1.51* 1.82* 2.15*  CALCIUM 10.1 9.7 10.2   GFR: Estimated Creatinine Clearance: 16.3 mL/min (by C-G formula based on SCr of 2.15 mg/dL (H)). Liver Function Tests:  Recent Labs Lab 01/24/16 1633  AST 39  ALT 26  ALKPHOS 68  BILITOT 0.3  PROT 8.1   ALBUMIN 3.8   No results for input(s): LIPASE, AMYLASE in the last 168 hours. No results for input(s): AMMONIA in the last 168 hours. Coagulation Profile: No results for input(s): INR, PROTIME in the last 168 hours. Cardiac Enzymes:  Recent Labs Lab 01/24/16 1633 01/26/16 1041  TROPONINI <0.03 <0.03   BNP (last 3 results) No results for input(s): PROBNP in the last 8760 hours. HbA1C: No results for input(s): HGBA1C in the last 72 hours. CBG:  Recent Labs Lab 01/25/16 2007 01/26/16 0007 01/26/16 0409 01/26/16 0728 01/26/16 1132  GLUCAP 185* 235* 202* 165* 239*   Lipid Profile: No results for input(s): CHOL, HDL, LDLCALC, TRIG, CHOLHDL, LDLDIRECT in the last 72 hours. Thyroid Function Tests:  Recent Labs  01/25/16 1759  TSH 8.794*  FREET4 1.46*   Anemia Panel: No results for input(s): VITAMINB12, FOLATE, FERRITIN, TIBC, IRON, RETICCTPCT in the last 72 hours. Urine analysis:    Component Value Date/Time   COLORURINE YELLOW 11/06/2015 1830   APPEARANCEUR CLEAR 11/06/2015 1830   LABSPEC 1.012 11/06/2015 1830   PHURINE 8.0 11/06/2015 1830   GLUCOSEU NEGATIVE 11/06/2015 1830   HGBUR NEGATIVE 11/06/2015 1830   BILIRUBINUR NEGATIVE 11/06/2015 1830   KETONESUR NEGATIVE 11/06/2015 1830   PROTEINUR NEGATIVE 11/06/2015 1830   UROBILINOGEN 0.2 01/27/2015 2308   NITRITE NEGATIVE 11/06/2015 1830   LEUKOCYTESUR NEGATIVE 11/06/2015 1830     )No results found for this or any previous visit (from the past 240 hour(s)).    Anti-infectives    None       Radiology Studies: Dg Chest Port 1 View  Result Date: 01/24/2016 CLINICAL DATA:  Shortness of breath. EXAM: PORTABLE CHEST 1 VIEW COMPARISON:  11/09/2015. FINDINGS: Trachea is midline. Heart size stable. Thoracic aorta is calcified. Lungs are clear. No pleural fluid. Right hemidiaphragm is elevated. Degenerative changes are seen in the glenohumeral joints bilaterally. IMPRESSION: No acute findings. Electronically  Signed   By: Leanna Battles M.D.   On: 01/24/2016 17:13        Scheduled Meds: . acetaminophen  650 mg Oral BID  . amiodarone  400 mg Oral BID  . amLODipine  10 mg Oral Daily  . atorvastatin  10 mg Oral q1800  . B-complex with vitamin C  1 tablet Oral Daily  . calcium-vitamin D  1 tablet Oral Q breakfast  . chlorhexidine  15 mL Mouth/Throat BID  . cycloSPORINE  1 drop Both Eyes BID  . feeding supplement  1 Container Oral Daily  . feeding supplement (OSMOLITE 1.5 CAL)  1,000 mL Per Tube Q24H  . fluticasone  2  spray Each Nare Daily  . free water  60 mL Per Tube Q6H  . gabapentin  300 mg Oral TID  . hydrALAZINE  50 mg Oral TID  . insulin aspart  0-15 Units Subcutaneous Q4H  . ipratropium-albuterol  3 mL Nebulization Q6H  . isosorbide mononitrate  30 mg Oral Daily  . latanoprost  1 drop Both Eyes QHS  . levothyroxine  137 mcg Oral QAC breakfast  . [START ON 01/27/2016] methylPREDNISolone (SOLU-MEDROL) injection  40 mg Intravenous Daily  . mometasone-formoterol  2 puff Inhalation BID  . pantoprazole  40 mg Oral BID  . polyethylene glycol  17 g Oral BID  . polyvinyl alcohol  1 drop Both Eyes QID  . sertraline  100 mg Oral QHS  . sodium chloride flush  3 mL Intravenous Q12H  . sodium chloride flush  3 mL Intravenous Q12H   Continuous Infusions: . sodium chloride    . amiodarone 30 mg/hr (01/26/16 0335)  . heparin 900 Units/hr (01/26/16 1039)     LOS: 2 days    Time spent: 35 min    Jessicca Stitzer U Miran Kautzman, DO [facility] Pager [phone]  If 7PM-7AM, please contact night-coverage [email or web address] Password [email or web address] 01/26/2016, 12:48 PM

## 2016-01-27 ENCOUNTER — Inpatient Hospital Stay (HOSPITAL_COMMUNITY): Payer: Medicare Other

## 2016-01-27 ENCOUNTER — Encounter (HOSPITAL_COMMUNITY): Payer: Self-pay | Admitting: General Practice

## 2016-01-27 DIAGNOSIS — J4551 Severe persistent asthma with (acute) exacerbation: Secondary | ICD-10-CM

## 2016-01-27 DIAGNOSIS — I2699 Other pulmonary embolism without acute cor pulmonale: Secondary | ICD-10-CM

## 2016-01-27 DIAGNOSIS — N289 Disorder of kidney and ureter, unspecified: Secondary | ICD-10-CM

## 2016-01-27 DIAGNOSIS — J3489 Other specified disorders of nose and nasal sinuses: Secondary | ICD-10-CM

## 2016-01-27 DIAGNOSIS — I82492 Acute embolism and thrombosis of other specified deep vein of left lower extremity: Secondary | ICD-10-CM

## 2016-01-27 HISTORY — PX: IR GENERIC HISTORICAL: IMG1180011

## 2016-01-27 LAB — BASIC METABOLIC PANEL
Anion gap: 13 (ref 5–15)
BUN: 72 mg/dL — ABNORMAL HIGH (ref 6–20)
CO2: 22 mmol/L (ref 22–32)
Calcium: 9.6 mg/dL (ref 8.9–10.3)
Chloride: 99 mmol/L — ABNORMAL LOW (ref 101–111)
Creatinine, Ser: 1.86 mg/dL — ABNORMAL HIGH (ref 0.44–1.00)
GFR calc Af Amer: 26 mL/min — ABNORMAL LOW (ref >60)
GFR calc non Af Amer: 23 mL/min — ABNORMAL LOW (ref >60)
Glucose, Bld: 170 mg/dL — ABNORMAL HIGH (ref 65–99)
Potassium: 3.6 mmol/L (ref 3.5–5.1)
Sodium: 134 mmol/L — ABNORMAL LOW (ref 135–145)

## 2016-01-27 LAB — HEPARIN LEVEL (UNFRACTIONATED)
Heparin Unfractionated: 1 IU/mL — ABNORMAL HIGH (ref 0.30–0.70)
Heparin Unfractionated: 0.97 IU/mL — ABNORMAL HIGH (ref 0.30–0.70)

## 2016-01-27 LAB — CBC
HCT: 24.3 % — ABNORMAL LOW (ref 36.0–46.0)
Hemoglobin: 8.4 g/dL — ABNORMAL LOW (ref 12.0–15.0)
MCH: 29.1 pg (ref 26.0–34.0)
MCHC: 34.6 g/dL (ref 30.0–36.0)
MCV: 84.1 fL (ref 78.0–100.0)
Platelets: 351 10*3/uL (ref 150–400)
RBC: 2.89 MIL/uL — ABNORMAL LOW (ref 3.87–5.11)
RDW: 16.3 % — ABNORMAL HIGH (ref 11.5–15.5)
WBC: 13.2 10*3/uL — ABNORMAL HIGH (ref 4.0–10.5)

## 2016-01-27 LAB — PROTIME-INR
INR: 1.2
Prothrombin Time: 15.2 seconds (ref 11.4–15.2)

## 2016-01-27 LAB — MRSA PCR SCREENING: MRSA by PCR: POSITIVE — AB

## 2016-01-27 LAB — GLUCOSE, CAPILLARY
Glucose-Capillary: 182 mg/dL — ABNORMAL HIGH (ref 65–99)
Glucose-Capillary: 193 mg/dL — ABNORMAL HIGH (ref 65–99)
Glucose-Capillary: 217 mg/dL — ABNORMAL HIGH (ref 65–99)
Glucose-Capillary: 220 mg/dL — ABNORMAL HIGH (ref 65–99)
Glucose-Capillary: 239 mg/dL — ABNORMAL HIGH (ref 65–99)
Glucose-Capillary: 253 mg/dL — ABNORMAL HIGH (ref 65–99)

## 2016-01-27 LAB — ECHOCARDIOGRAM COMPLETE
Height: 66 in
Weight: 2256 oz

## 2016-01-27 LAB — APTT
aPTT: 60 seconds — ABNORMAL HIGH (ref 24–36)
aPTT: 66 seconds — ABNORMAL HIGH (ref 24–36)
aPTT: 88 seconds — ABNORMAL HIGH (ref 24–36)

## 2016-01-27 MED ORDER — MUPIROCIN 2 % EX OINT
1.0000 "application " | TOPICAL_OINTMENT | Freq: Two times a day (BID) | CUTANEOUS | Status: AC
  Administered 2016-01-27 – 2016-01-31 (×10): 1 via NASAL
  Filled 2016-01-27 (×2): qty 22

## 2016-01-27 MED ORDER — WARFARIN SODIUM 2 MG PO TABS
2.0000 mg | ORAL_TABLET | Freq: Once | ORAL | Status: AC
  Administered 2016-01-27: 2 mg via ORAL
  Filled 2016-01-27: qty 1

## 2016-01-27 MED ORDER — BENZONATATE 100 MG PO CAPS
100.0000 mg | ORAL_CAPSULE | Freq: Two times a day (BID) | ORAL | Status: DC | PRN
  Administered 2016-01-27 – 2016-01-30 (×2): 100 mg via ORAL
  Filled 2016-01-27 (×2): qty 1

## 2016-01-27 MED ORDER — WARFARIN VIDEO: Freq: Once | Status: DC

## 2016-01-27 MED ORDER — CHLORHEXIDINE GLUCONATE CLOTH 2 % EX PADS
6.0000 | MEDICATED_PAD | Freq: Every day | CUTANEOUS | Status: AC
  Administered 2016-01-27 – 2016-01-31 (×5): 6 via TOPICAL

## 2016-01-27 MED ORDER — WARFARIN - PHARMACIST DOSING INPATIENT: Freq: Every day | Status: DC

## 2016-01-27 MED ORDER — COUMADIN BOOK
Freq: Once | Status: AC
  Administered 2016-01-27: 18:00:00
  Filled 2016-01-27: qty 1

## 2016-01-27 NOTE — Progress Notes (Signed)
ANTICOAGULATION CONSULT NOTE - Follow up [place] for heparin/coumadin Indication: DVT  No Known Allergies  Patient Measurements: Height: 5\' 6"  (167.6 cm) Weight: 141 lb (64 kg) IBW/kg (Calculated) : 59.3 Heparin Dosing Weight: 68 Kg  Vital Signs: Temp: 98.1 F (36.7 C) (10/26 0800) Temp Source: Oral (10/26 0800) BP: 140/59 (10/26 0800) Pulse Rate: 73 (10/26 0800)  Labs:  Recent Labs  01/25/16 0327  01/26/16 0836 01/26/16 1041 01/26/16 1537 01/26/16 1859 01/26/16 2129 01/27/16 0318 01/27/16 1235  HGB 9.6*  --  9.6*  --   --   --   --  8.4*  --   HCT 28.1*  --  27.3*  --   --   --   --  24.3*  --   PLT 387  --  406*  --   --   --   --  351  --   APTT  --   < > 102*  --   --  81*  --  60* 66*  LABPROT  --   --   --   --   --   --   --   --  15.2  INR  --   --   --   --   --   --   --   --  1.20  HEPARINUNFRC  --   --  >2.20*  --   --  2.04*  --  1.00*  --   CREATININE 1.82*  --  2.15*  --   --   --   --  1.86*  --   TROPONINI  --   --   --  <0.03 <0.03  --  <0.03  --   --   < > = values in this interval not displayed. Estimated Creatinine Clearance: 18.8 mL/min (by C-G formula based on SCr of 1.86 mg/dL (H)).  Medical History: Past Medical History:  Diagnosis Date  . Anemia    previous blood transfusions  . Arthritis    "all over"  . Asthma   . Bradycardia    topped BB and amiodarone decreased- junctional rhythm   . CHF (congestive heart failure) ([place])   . Complication of blood transfusion    "got the wrong blood type at [facility] in ~ 2015; no adverse reaction that we are aware of"/daughter, [name] (01/27/2016)  . COPD (chronic obstructive pulmonary disease) ([place])   . Depression    "light case"  . DVT (deep venous thrombosis) ([place]) 01/2016   LLE  . Gastric stenosis   . GERD (gastroesophageal reflux disease)   . History of blood transfusion    "several" (01/27/2016)  . History of stomach ulcers   . Hyperlipidemia   . Hypertension   .  Hypothyroidism   . Paraesophageal hernia   . Perforated gastric ulcer ([place])   . Pneumonia ?2017 X 1  . Renal insufficiency   . Seasonal allergies   . SIADH (syndrome of inappropriate ADH production) ([place])    [name] 01/10/2015  . Small bowel obstruction    "I don't know how many" (01/11/2015)  . Stroke ([place])    "light one"  . Type II diabetes mellitus ([place])    "related to prednisone use  for > 20 yr; once predinose stopped; no more DM RX" (01/27/2016)  . Ventral hernia with bowel obstruction    Medications:  Prescriptions Prior to Admission  Medication Sig Dispense Refill Last Dose  . acetaminophen (TYLENOL) 325 MG tablet  Take 650 mg by mouth 2 (two) times daily as needed for mild pain.    01/22/2016 at pm  . ADVAIR DISKUS 250-50 MCG/DOSE AEPB INHALE 1 PUFF INTO THE LUNGS 2 (TWO) TIMES DAILY AS NEEDED FOR SHORTNESS OF BREATH  3 01/24/2016 at am  . albuterol (PROVENTIL HFA;VENTOLIN HFA) 108 (90 Base) MCG/ACT inhaler Inhale 2 puffs into the lungs every 6 (six) hours as needed for wheezing or shortness of breath. 1 Inhaler 2 01/23/2016 at pm  . AMINO ACIDS-PROTEIN HYDROLYS PO Take 30 mLs by mouth 2 (two) times daily.   01/24/2016 at am  . amiodarone (PACERONE) 100 MG tablet Take 100 mg by mouth daily.   01/24/2016 at am  . amLODipine (NORVASC) 10 MG tablet TAKE 1 TABLET BY MOUTH DAILY FOR HYPERTENSION (Patient taking differently: Take 10 mg by mouth once a day for hypertension) 30 tablet 3 01/24/2016 at 1200  . apixaban (ELIQUIS) 2.5 MG TABS tablet Take 1 tablet (2.5 mg total) by mouth 2 (two) times daily. 180 tablet 1 01/24/2016 at 0900  . atorvastatin (LIPITOR) 10 MG tablet Take 10 mg by mouth daily.   01/23/2016 at 1800  . b complex vitamins tablet Take 1 tablet by mouth daily.    01/24/2016 at 0900  . bisacodyl (DULCOLAX) 10 MG suppository Place 1 suppository (10 mg total) rectally daily as needed for moderate constipation. 12 suppository 0 Taking  . calcium-vitamin D (OSCAL WITH D)  500-200 MG-UNIT per tablet Take 1 tablet by mouth daily with breakfast.    01/24/2016 at am  . cetirizine (ZYRTEC) 10 MG tablet Take 10 mg by mouth daily as needed for allergies or rhinitis.   Past Week at Unknown time  . chlorhexidine (PERIDEX) 0.12 % solution Use as directed 15 mLs in the mouth or throat 2 (two) times daily.   01/24/2016 at am  . diclofenac sodium (VOLTAREN) 1 % GEL Apply 2 g topically daily as needed (for pain).    Past Week at Unknown time  . feeding supplement (BOOST / RESOURCE BREEZE) LIQD Take 1 Container by mouth daily. (Patient taking differently: Take 1 Container by mouth 2 (two) times daily between meals. )   01/24/2016 at am  . fluticasone (FLONASE) 50 MCG/ACT nasal spray Place 2 sprays into both nostrils daily. 16 g 3 01/24/2016 at am  . gabapentin (NEURONTIN) 300 MG capsule Take 2 tablets in the morning, Take 1 tablet at noon, Take 2 tablets at night for pain (Patient taking differently: Take 300-600 mg by mouth See admin instructions. 600 mg in the morning then 300 mg at noontime then 600 mg at night (for pain)) 150 capsule 1 01/24/2016 at 1200  . hydrALAZINE (APRESOLINE) 50 MG tablet Take 1 tablet (50 mg total) by mouth 3 (three) times daily. 120 tablet 0 01/24/2016 at 1200  . ipratropium-albuterol (DUONEB) 0.5-2.5 (3) MG/3ML SOLN Take 3 mLs by nebulization every 6 (six) hours as needed (sob). Use 3 times daily x 4 days then every 6 hours as needed. (Patient taking differently: Take 3 mLs by nebulization every 6 (six) hours as needed (for shortness of breath). ) 360 mL 4 Taking  . isosorbide mononitrate (IMDUR) 30 MG 24 hr tablet TAKE 1 TABLET (30 MG TOTAL) BY MOUTH DAILY.  2 01/24/2016 at 0900  . latanoprost (XALATAN) 0.005 % ophthalmic solution Place 1 drop into both eyes at bedtime. 2.5 mL 12 01/23/2016 at pm  . levothyroxine (SYNTHROID, LEVOTHROID) 137 MCG tablet Take 1 tablet (137 mcg total)  by mouth daily before breakfast. 30 tablet 2 01/24/2016 at am  .  Maltodextrin-Xanthan Gum (RESOURCE THICKENUP CLEAR) POWD Take 120 g by mouth as needed (use to thicken liquids.). (Patient taking differently: See admin instructions. USED TO THICKEN LIQUIDS) 20 Can 0 Past Week at Unknown time  . Multiple Vitamins-Minerals (MULTIVITAMIN PO) Take 1 tablet by mouth daily.   01/24/2016 at am  . Nutritional Supplements (FEEDING SUPPLEMENT, OSMOLITE 1.5 CAL,) LIQD Place 1,000 mLs into feeding tube daily. (Patient taking differently: Place 1,000 mLs into feeding tube daily. (at night for 12 hours)) 1000 mL 3 01/23/2016 at pm  . ondansetron (ZOFRAN) 4 MG tablet Take 1 tablet (4 mg total) by mouth every 8 (eight) hours as needed for nausea or vomiting. 20 tablet 0 Taking  . pantoprazole (PROTONIX) 40 MG tablet TAKE 1 TABLET BY MOUTH TWICE A DAY (Patient taking differently: Take 40 mg by mouth two times a day) 60 tablet 5 01/24/2016 at am  . Polyethyl Glycol-Propyl Glycol 0.4-0.3 % SOLN Place 1 drop into both eyes 4 (four) times daily.    01/24/2016 at am  . polyethylene glycol powder (GLYCOLAX/MIRALAX) powder MIX 17GRAMS IN 8 OUNCES OF LIQUID AND DRINK TWICE DAILY AS NEEDED (Patient taking differently: Mix 17 grams into 8 ounces of juice and drink one to two times a day as needed) 3162 g 1 Past Month at Unknown time  . RESTASIS 0.05 % ophthalmic emulsion USE 1 DROP INTO BOTH EYES TWICE DAILY 60 mL 0 01/24/2016 at am  . sertraline (ZOLOFT) 100 MG tablet Take one tablet by mouth once daily for depression (Patient taking differently: Take 100 mg by mouth daily. FOR DEPRESSION) 90 tablet 1 01/24/2016 at am  . simethicone (MYLICON) 80 MG chewable tablet Chew 80 mg by mouth 4 (four) times daily as needed for flatulence.   Taking  . sodium chloride 1 g tablet TAKE 1 TABLET (1 g) BY MOUTH THREE TIMES DAILY 90 tablet 2 01/24/2016 at 1200  . traMADol (ULTRAM) 50 MG tablet Take 50 mg by mouth daily as needed for moderate pain.    Past Week at Unknown time  . Water For Irrigation, Sterile  (FREE WATER) SOLN Place 60 mLs into feeding tube every 6 (six) hours.   01/24/2016 at am  . NONFORMULARY OR COMPOUNDED ITEM Apply 1-2 g topically 4 (four) times daily. Peripheral Neuropathy cream: Bupivacaine 1% Doxepin 3% Gabapentin 6% Ibuprofen 3% Pentoxifylline 3% 120 each 2 NOT YET at NOT YET   Assessment: Apixaban PTA for Afib now with new DVT - last dose 10/24 at ~0930. Continues on heparin per pharmacy now to add coumadin.   APTT therapeutic at 66 sec however, will obtain 8hr confirmatory aPTT considering recent fluctuations. INR 1.2 today - will dose coumadin conservatively with advanced age, frailty, and DDI with amiodarone.   Small decrease in CBC - Hgb 9.6>8.4, plt 406>351. No bleeding noted.    Goal of Therapy:  Heparin level 0.3-0.7 units/ml  APTT 66-102s INR 2-3 Monitor platelets by anticoagulation protocol: Yes   Plan:  -Continue heparin at 1000 units/hr -Coumadin 2mg  x1 -8hr confirmatory aPTT/HL -Daily aPTT/HL/INR  -Dose by HL once correlating -Monitor CBC, s/sx bleeding  Stephens November, PharmD Clinical Pharmacist 2:42 PM, 01/27/2016

## 2016-01-27 NOTE — Progress Notes (Signed)
ANTICOAGULATION CONSULT NOTE - Follow Up Consult  Pharmacy Consult for heparin Indication: DVT  No Known Allergies  Patient Measurements: Height: 5\' 6"  (167.6 cm) Weight: 141 lb (64 kg) IBW/kg (Calculated) : 59.3 Heparin Dosing Weight: 68 kg  Vital Signs: Temp: 97.8 F (36.6 C) (10/26 2008) Temp Source: Oral (10/26 2008) BP: 154/56 (10/26 2008) Pulse Rate: 84 (10/26 2008)  Labs:  Recent Labs  01/25/16 0327  01/26/16 0836 01/26/16 1041 01/26/16 1537 01/26/16 1859 01/26/16 2129 01/27/16 0318 01/27/16 1235 01/27/16 2001  HGB 9.6*  --  9.6*  --   --   --   --  8.4*  --   --   HCT 28.1*  --  27.3*  --   --   --   --  24.3*  --   --   PLT 387  --  406*  --   --   --   --  351  --   --   APTT  --   < > 102*  --   --  81*  --  60* 66* 88*  LABPROT  --   --   --   --   --   --   --   --  15.2  --   INR  --   --   --   --   --   --   --   --  1.20  --   HEPARINUNFRC  --   < > >2.20*  --   --  2.04*  --  1.00*  --  0.97*  CREATININE 1.82*  --  2.15*  --   --   --   --  1.86*  --   --   TROPONINI  --   --   --  <0.03 <0.03  --  <0.03  --   --   --   < > = values in this interval not displayed.  Estimated Creatinine Clearance: 18.8 mL/min (by C-G formula based on SCr of 1.86 mg/dL (H)).   Medications:  Scheduled:  . acetaminophen  650 mg Oral BID  . amiodarone  400 mg Oral BID  . amLODipine  10 mg Oral Daily  . atorvastatin  10 mg Oral q1800  . B-complex with vitamin C  1 tablet Oral Daily  . calcium-vitamin D  1 tablet Oral Q breakfast  . chlorhexidine  15 mL Mouth/Throat BID  . Chlorhexidine Gluconate Cloth  6 each Topical Q0600  . cycloSPORINE  1 drop Both Eyes BID  . feeding supplement  1 Container Oral Daily  . feeding supplement (OSMOLITE 1.5 CAL)  1,000 mL Per Tube Q24H  . fluticasone  2 spray Each Nare Daily  . free water  60 mL Per Tube Q6H  . gabapentin  300 mg Oral TID  . hydrALAZINE  50 mg Oral TID  . insulin aspart  0-15 Units Subcutaneous Q4H  .  ipratropium-albuterol  3 mL Nebulization TID  . isosorbide mononitrate  30 mg Oral Daily  . latanoprost  1 drop Both Eyes QHS  . levothyroxine  137 mcg Oral QAC breakfast  . methylPREDNISolone (SOLU-MEDROL) injection  40 mg Intravenous Daily  . mometasone-formoterol  2 puff Inhalation BID  . mupirocin ointment  1 application Nasal BID  . pantoprazole  40 mg Oral BID  . polyethylene glycol  17 g Oral BID  . polyvinyl alcohol  1 drop Both Eyes QID  . sertraline  100 mg Oral QHS  .  sodium chloride flush  3 mL Intravenous Q12H  . sodium chloride flush  3 mL Intravenous Q12H  . warfarin   Does not apply Once  . Warfarin - Pharmacist Dosing Inpatient   Does not apply q1800   Infusions:  . sodium chloride    . heparin 1,000 Units/hr (01/27/16 0454)    Assessment: 82 yo female with afib now with new DVT (was on apixaban previously) is currently on therapeutic heparin.  Her aPTT is 88 and heparin level is 0.97 which probably still has effect from apixaban.  Goal of Therapy:  aPTT 66-102 seconds Monitor platelets by anticoagulation protocol: Yes   Plan:  - continue heparin at 1000 units/hr - daily heparin level, aPTT and CBC  Shannon [name], Tsz-Yin 01/27/2016,8:58 PM

## 2016-01-27 NOTE — Progress Notes (Signed)
[facility]                                                                              Patient Demographics  Shannon [name], is a 82 y.o. female, DOB - 1923/12/15, UEA:[id number]  Admit date - 01/24/2016   Admitting Physician Kathleene Hazel, MD  Outpatient Primary MD for the patient is Kirt Boys, DO  Outpatient specialists:   LOS - 3  days    No chief complaint on file.      Brief summary   Shannon Smithis an 82 y.o.femaleWith PMHx of a fib, asthma, hypothyroid who was directly admitted from the cardiology office for shortness of breath. She was found to be in a flutter- rapid. Her O2 sats were 87% in the office on room air. Patient has h/o asthma and has been using her inhalers without relief for the last few days. She has increased LE edema as well.  When patient arrived in the hospital, she was tachypneic using accessory muscles and having increased shortness of breath. She was started on IV amio, placed on Bipap and given a dose of IV lasix with good UOP. Hospitalist were consulted to help with wheezing/asthma exacerbation and possible infected G tube. G tube was examined by IR and found to be working.  Patient converted back to SR on own night of 10/24.    Assessment & Plan   A flutter uncontrolled leading to acute on chronic diastolic HF- patient does not tolerate well -converted to NSR - amiodarone per cards, cardioversion cancelled, Management per cardiology  Gastric outlet obstruction with recent g tube placement at [place], clogged J tube  - patient just had tube replaced by IR on 10/5-- has granulation tissue- appreciate IR Assistance. If unable to unclog, plan to exchange.  Asthma, acute hypoxic respiratory failure -patient was wheezing but resolved with IV lasix (suspect related to volume overload and atrial flutter) -nebs scheduled and PRN, pulmonology following - 2-D echo EF 55-60%, grade 1 diastolic  dysfunction  Hypothyroidism -continue home meds - TSH 8.7 with free T4 1.46 (slightly elevated), need to repeat these labs in 4 weeks and adjust Synthroid dose  DVT on eliquis (25 mg dose) -per oncology change to xarelto upon dc, pulmonology recommending transition to Coumadin given development of clot on eliquis - Continue heparin for now   Code Status:  DVT Prophylaxis:  heparin  Family Communication: Discussed in detail with the patient, all imaging results, lab results explained to the patient    Disposition Plan: per primary team  Time Spent in minutes  20 minutes   Antimicrobials:      Medications  Scheduled Meds: . acetaminophen  650 mg Oral BID  . amiodarone  400 mg Oral BID  . amLODipine  10 mg Oral Daily  . atorvastatin  10 mg Oral q1800  . B-complex with vitamin C  1 tablet Oral Daily  . calcium-vitamin D  1 tablet Oral Q breakfast  . chlorhexidine  15 mL Mouth/Throat BID  . Chlorhexidine Gluconate Cloth  6 each Topical Q0600  . coumadin book   Does not apply Once  . cycloSPORINE  1 drop Both Eyes BID  . feeding supplement  1 Container Oral Daily  . feeding supplement (OSMOLITE 1.5 CAL)  1,000 mL Per Tube Q24H  . fluticasone  2 spray Each Nare Daily  . free water  60 mL Per Tube Q6H  . gabapentin  300 mg Oral TID  . hydrALAZINE  50 mg Oral TID  . insulin aspart  0-15 Units Subcutaneous Q4H  . ipratropium-albuterol  3 mL Nebulization TID  . isosorbide mononitrate  30 mg Oral Daily  . latanoprost  1 drop Both Eyes QHS  . levothyroxine  137 mcg Oral QAC breakfast  . methylPREDNISolone (SOLU-MEDROL) injection  40 mg Intravenous Daily  . mometasone-formoterol  2 puff Inhalation BID  . mupirocin ointment  1 application Nasal BID  . pantoprazole  40 mg Oral BID  . polyethylene glycol  17 g Oral BID  . polyvinyl alcohol  1 drop Both Eyes QID  . sertraline  100 mg Oral QHS  . sodium chloride flush  3 mL Intravenous Q12H  . sodium chloride flush  3 mL  Intravenous Q12H  . warfarin  2 mg Oral ONCE-1800  . warfarin   Does not apply Once  . Warfarin - Pharmacist Dosing Inpatient   Does not apply q1800   Continuous Infusions: . sodium chloride    . amiodarone Stopped (01/26/16 1524)  . heparin 1,000 Units/hr (01/27/16 0454)   PRN Meds:.sodium chloride, acetaminophen, bisacodyl, levalbuterol, ondansetron (ZOFRAN) IV, ondansetron, RESOURCE THICKENUP CLEAR, simethicone, sodium chloride flush, sodium chloride flush, traMADol   Antibiotics   Anti-infectives    None        Subjective:   Shannon [name] was seen and examined today.  No complaints, getting echo done at the time of encounter.  Patient denies dizziness, abdominal pain, N/V/D/C, new weakness, numbess, tingling. No acute events overnight.    Objective:   Vitals:   01/27/16 0500 01/27/16 0800 01/27/16 0925 01/27/16 1453  BP: (!) 142/71 (!) 140/59    Pulse: 81 73    Resp: 14 16    Temp: 97.6 F (36.4 C) 98.1 F (36.7 C)    TempSrc: Oral Oral    SpO2: 100% 100% 97% 99%  Weight: 64 kg (141 lb)     Height:        Intake/Output Summary (Last 24 hours) at 01/27/16 1518 Last data filed at 01/27/16 1000  Gross per 24 hour  Intake           940.93 ml  Output             2201 ml  Net         -1260.07 ml     Wt Readings from Last 3 Encounters:  01/27/16 64 kg (141 lb)  01/24/16 67.3 kg (148 lb 6.4 oz)  12/29/15 68.9 kg (151 lb 12.8 oz)     Exam  General: Alert and oriented x 3, NAD  HEENT:  PERRLA, EOMI, Anicteric Sclera.   Neck:  Cardiovascular: S1 S2 auscultated, 2/6 systolic murmur   Respiratory: decreased BS at bases with scattered wheezing  Gastrointestinal: Soft, nontender, nondistended, + bowel sounds, G-tube  Ext: no cyanosis clubbing or edema  Neuro:   Skin: No rashes  Psych: Normal affect and demeanor, alert and oriented x3    Data Reviewed:  I have personally reviewed following labs and imaging studies  Micro Results Recent Results  (from the past 240 hour(s))  MRSA PCR Screening     Status: Abnormal  Collection Time: 01/27/16  9:09 AM  Result Value Ref Range Status   MRSA by PCR POSITIVE (A) NEGATIVE Final    Comment:        The GeneXpert MRSA Assay (FDA approved for NASAL specimens only), is one component of a comprehensive MRSA colonization surveillance program. It is not intended to diagnose MRSA infection nor to guide or monitor treatment for MRSA infections. RESULT CALLED TO, READ BACK BY AND VERIFIED WITH: Epimenio Foot RN 11:35 01/27/16 ([email or web address])     Radiology Reports Dg Cervical Spine 2 Or 3 Views  Result Date: 01/04/2016 CLINICAL DATA:  Fall.  Neck pain. EXAM: CERVICAL SPINE - 2-3 VIEW COMPARISON:  CT 11/10/2015. FINDINGS: Diffuse multilevel degenerative change. Degenerative changes most severe at C3-C4 on C5-C6. No acute abnormality identified. No evidence of fracture or dislocation. Bilateral carotid vascular calcification. Pulmonary apices are clear. IMPRESSION: 1.  Diffuse multilevel degenerative change. 2.  Bilateral carotid vascular disease . Electronically Signed   By: Maisie Fus  Register   On: 01/04/2016 08:20   Ir Gj Tube Change  Result Date: 01/07/2016 INDICATION: 82 year old female with capacious stomach and gastroparesis. She currently feeds via a percutaneous gastro jejunostomy tube. Unfortunately, the jejunal arm has pulled back into the stomach and patient is experiencing nausea and vomiting following feedings. EXAM: JEJUNAL CATHETER REPLACEMENT MEDICATIONS: None ANESTHESIA/SEDATION: None CONTRAST:  80 mL Isovue 300 - administered into the gastric lumen. FLUOROSCOPY TIME:  Fluoroscopy Time: 18 minutes 54 seconds (305 mGy). COMPLICATIONS: None immediate. PROCEDURE: Informed written consent was obtained from the patient after a thorough discussion of the procedural risks, benefits and alternatives. All questions were addressed. Maximal Sterile Barrier Technique was utilized including caps, mask,  sterile gowns, sterile gloves, sterile drape, hand hygiene and skin antiseptic. A timeout was performed prior to the initiation of the procedure. The existing percutaneous gastrojejunostomy tube was removed following deflation of the retention balloon. A C2 cobra catheter was then inserted through the ostium and navigated gastric antrum. It is extremely difficult to maneuver the catheter within the stomach. The body and fundus of the stomach are extremely capacious. A large volume of contrast and saline were injected in the region of the antrum in order to opacify and define the anatomy. Ultimately, the C2 cobra catheter was advanced deep into the gastric antrum then exchanged over a stiff Amplatz wire for a Barrett's teen catheter which was then advanced through the pylorus and into the proximal jejunum with the assistance of a road runner wire. The catheter and wire were advanced all the way to the ligament Treitz. A new 22 French percutaneous gastrojejunostomy tube was then advanced over the wire and positioned with the tip in the proximal small bowel. Unfortunately, the length of the tube is greater than the distance to the ligament of Treitz and beyond the ligament Treitz the small bowel is extremely tortuous. The final segment of gastrostomy tube pushing into the stomach created a small loop heading toward the fundus. The weighted tip remains within the proximal small bowel. This was confirmed by a gentle hand injection of contrast material. The retention balloon was inflated and pulled snug against the anterior abdominal wall. The external bumper was fixed in place. IMPRESSION: 1. Successful replacement of a 35 French percutaneous gastro jejunostomy tube with the tube tip in the proximal small bowel. Unfortunately, due to a size mismatch between the very long tube and the relatively small patient there is still a loop of redundancy approximately in the stomach. PLAN: Tube is currently  ready for use. At some  point in the future, the tip of the J arm may again migrate into the fundus of the stomach. If this happens, replacement should be either with a modified 22 French gastrojejunostomy tube which has been cut shorter (approximately 10-15 cm) or a shorter direct percutaneous jejunostomy tube given that the family really uses the gastrostomy portion. Findings were discussed with the patient's daughter voiced her understanding. Signed, Sterling Big, MD [facility] Radiology Electronically Signed   By: Malachy Moan M.D.   On: 01/07/2016 15:45   Dg Chest Port 1 View  Result Date: 01/24/2016 CLINICAL DATA:  Shortness of breath. EXAM: PORTABLE CHEST 1 VIEW COMPARISON:  11/09/2015. FINDINGS: Trachea is midline. Heart size stable. Thoracic aorta is calcified. Lungs are clear. No pleural fluid. Right hemidiaphragm is elevated. Degenerative changes are seen in the glenohumeral joints bilaterally. IMPRESSION: No acute findings. Electronically Signed   By: Leanna Battles M.D.   On: 01/24/2016 17:13   Dg Abd 2 Views  Result Date: 01/04/2016 CLINICAL DATA:  Abdominal bloating and pain near GJ tube for a few weeks. EXAM: ABDOMEN - 2 VIEW COMPARISON:  Abdominal radiograph and CT abdomen and pelvis 11/06/2015 FINDINGS: The gastrojejunostomy tube is coiled in the stomach with tip at the level of the proximal gastric body. The tube extended into the duodenum on the prior CT. Gas is present in nondilated colon. No dilated loops of bowel are seen to suggest obstruction. There is no evidence of intraperitoneal free air. Right upper quadrant surgical clips are present, and a bowel anastomosis is present in the left mid abdomen. The visualized lung bases are grossly clear. No acute osseous abnormality is identified. IMPRESSION: 1. GJ tube coiled in the stomach. The tip was in the duodenum on the prior CT. 2. No evidence of bowel obstruction. Electronically Signed   By:  Sebastian Ache M.D.   On: 01/04/2016 08:25    Lab Data:  CBC:  Recent Labs Lab 01/24/16 1633 01/25/16 0327 01/26/16 0836 01/27/16 0318  WBC 6.4 7.1 12.4* 13.2*  NEUTROABS 3.8  --   --   --   HGB 9.7* 9.6* 9.6* 8.4*  HCT 28.9* 28.1* 27.3* 24.3*  MCV 86.8 85.2 83.2 84.1  PLT 370 387 406* 351   Basic Metabolic Panel:  Recent Labs Lab 01/24/16 1633 01/25/16 0327 01/26/16 0836 01/27/16 0318  NA 132* 131* 132* 134*  K 4.9 4.8 4.3 3.6  CL 100* 97* 95* 99*  CO2 21* 22 20* 22  GLUCOSE 95 206* 169* 170*  BUN 44* 49* 69* 72*  CREATININE 1.51* 1.82* 2.15* 1.86*  CALCIUM 10.1 9.7 10.2 9.6   GFR: Estimated Creatinine Clearance: 18.8 mL/min (by C-G formula based on SCr of 1.86 mg/dL (H)). Liver Function Tests:  Recent Labs Lab 01/24/16 1633  AST 39  ALT 26  ALKPHOS 68  BILITOT 0.3  PROT 8.1  ALBUMIN 3.8   No results for input(s): LIPASE, AMYLASE in the last 168 hours. No results for input(s): AMMONIA in the last 168 hours. Coagulation Profile:  Recent Labs Lab 01/27/16 1235  INR 1.20   Cardiac Enzymes:  Recent Labs Lab 01/24/16 1633 01/26/16 1041 01/26/16 1537 01/26/16 2129  TROPONINI <0.03 <0.03 <0.03 <0.03   BNP (last 3 results) No results for input(s): PROBNP in the last 8760 hours. HbA1C: No results for input(s): HGBA1C in the last 72 hours. CBG:  Recent Labs Lab 01/26/16 1951 01/27/16 0016 01/27/16 0457 01/27/16  8119 01/27/16 1110  GLUCAP 158* 239* 193* 182* 220*   Lipid Profile: No results for input(s): CHOL, HDL, LDLCALC, TRIG, CHOLHDL, LDLDIRECT in the last 72 hours. Thyroid Function Tests:  Recent Labs  01/25/16 1759  TSH 8.794*  FREET4 1.46*   Anemia Panel: No results for input(s): VITAMINB12, FOLATE, FERRITIN, TIBC, IRON, RETICCTPCT in the last 72 hours. Urine analysis:    Component Value Date/Time   COLORURINE YELLOW 11/06/2015 1830   APPEARANCEUR CLEAR 11/06/2015 1830   LABSPEC 1.012 11/06/2015 1830   PHURINE 8.0  11/06/2015 1830   GLUCOSEU NEGATIVE 11/06/2015 1830   HGBUR NEGATIVE 11/06/2015 1830   BILIRUBINUR NEGATIVE 11/06/2015 1830   KETONESUR NEGATIVE 11/06/2015 1830   PROTEINUR NEGATIVE 11/06/2015 1830   UROBILINOGEN 0.2 01/27/2015 2308   NITRITE NEGATIVE 11/06/2015 1830   LEUKOCYTESUR NEGATIVE 11/06/2015 1830     Aide Wojnar M.D. [facility] 01/27/2016, 3:18 PM  Pager: [phone] Between 7am to 7pm - call Pager - [phone]  After 7pm go to [email or web address] - password [email or web address]  Call night coverage person covering after 7pm

## 2016-01-27 NOTE — Progress Notes (Signed)
Patient ID: Shannon [name], female   DOB: 05-Nov-1923, 82 y.o.   MRN: [id number]    Referring Physician(s): Dr. Tobias Alexander  Supervising Physician: Richarda Overlie  Patient Status: [facility] - In-pt  Chief Complaint: j-tube clogged  Subjective: Patient is known to our service for recent GJ tube exchange.  We were asked to see her on Tuesday of this week for a possible dysfunction of this tube; however, both the G and J portions flushed with no issues.  Today her J-tube feeds were disconnected and it took about 15 minutes before the line got flushed.  When the RN went back to flush it, the j-tube was clogged.  We have been asked to see her for this issue.   Allergies: Review of patient's allergies indicates no known allergies.  Medications: Prior to Admission medications   Medication Sig Start Date End Date Taking? Authorizing Provider  acetaminophen (TYLENOL) 325 MG tablet Take 650 mg by mouth 2 (two) times daily as needed for mild pain.    Yes Historical Provider, MD  ADVAIR DISKUS 250-50 MCG/DOSE AEPB INHALE 1 PUFF INTO THE LUNGS 2 (TWO) TIMES DAILY AS NEEDED FOR SHORTNESS OF BREATH 12/01/14  Yes Historical Provider, MD  albuterol (PROVENTIL HFA;VENTOLIN HFA) 108 (90 Base) MCG/ACT inhaler Inhale 2 puffs into the lungs every 6 (six) hours as needed for wheezing or shortness of breath. 06/04/15  Yes Monica Carter, DO  AMINO ACIDS-PROTEIN HYDROLYS PO Take 30 mLs by mouth 2 (two) times daily.   Yes Historical Provider, MD  amiodarone (PACERONE) 100 MG tablet Take 100 mg by mouth daily.   Yes Historical Provider, MD  amLODipine (NORVASC) 10 MG tablet TAKE 1 TABLET BY MOUTH DAILY FOR HYPERTENSION Patient taking differently: Take 10 mg by mouth once a day for hypertension 11/29/15  Yes Kirt Boys, DO  apixaban (ELIQUIS) 2.5 MG TABS tablet Take 1 tablet (2.5 mg total) by mouth 2 (two) times daily. 08/11/15  Yes Kirt Boys, DO  atorvastatin (LIPITOR) 10 MG tablet Take 10 mg by mouth daily.   Yes  Historical Provider, MD  b complex vitamins tablet Take 1 tablet by mouth daily.    Yes Historical Provider, MD  bisacodyl (DULCOLAX) 10 MG suppository Place 1 suppository (10 mg total) rectally daily as needed for moderate constipation. 09/30/15  Yes Calvert Cantor, MD  calcium-vitamin D (OSCAL WITH D) 500-200 MG-UNIT per tablet Take 1 tablet by mouth daily with breakfast.    Yes Historical Provider, MD  cetirizine (ZYRTEC) 10 MG tablet Take 10 mg by mouth daily as needed for allergies or rhinitis.   Yes Historical Provider, MD  chlorhexidine (PERIDEX) 0.12 % solution Use as directed 15 mLs in the mouth or throat 2 (two) times daily.   Yes Historical Provider, MD  diclofenac sodium (VOLTAREN) 1 % GEL Apply 2 g topically daily as needed (for pain).    Yes Historical Provider, MD  feeding supplement (BOOST / RESOURCE BREEZE) LIQD Take 1 Container by mouth daily. Patient taking differently: Take 1 Container by mouth 2 (two) times daily between meals.  11/11/15  Yes Rodolph Bong, MD  fluticasone [facility]) 50 MCG/ACT nasal spray Place 2 sprays into both nostrils daily. 08/07/14  Yes Kirt Boys, DO  gabapentin (NEURONTIN) 300 MG capsule Take 2 tablets in the morning, Take 1 tablet at noon, Take 2 tablets at night for pain Patient taking differently: Take 300-600 mg by mouth See admin instructions. 600 mg in the morning then 300 mg at noontime then 600  mg at night (for pain) 11/29/15  Yes Sharon Seller, NP  hydrALAZINE (APRESOLINE) 50 MG tablet Take 1 tablet (50 mg total) by mouth 3 (three) times daily. 11/11/15  Yes Rodolph Bong, MD  ipratropium-albuterol (DUONEB) 0.5-2.5 (3) MG/3ML SOLN Take 3 mLs by nebulization every 6 (six) hours as needed (sob). Use 3 times daily x 4 days then every 6 hours as needed. Patient taking differently: Take 3 mLs by nebulization every 6 (six) hours as needed (for shortness of breath).  11/11/15  Yes Rodolph Bong, MD  isosorbide mononitrate (IMDUR) 30 MG 24 hr  tablet TAKE 1 TABLET (30 MG TOTAL) BY MOUTH DAILY. 06/22/15  Yes Historical Provider, MD  latanoprost (XALATAN) 0.005 % ophthalmic solution Place 1 drop into both eyes at bedtime. 06/02/14  Yes Mahima Glade Lloyd, MD  levothyroxine (SYNTHROID, LEVOTHROID) 137 MCG tablet Take 1 tablet (137 mcg total) by mouth daily before breakfast. 12/27/15  Yes Kirt Boys, DO  Maltodextrin-Xanthan Gum (RESOURCE THICKENUP CLEAR) POWD Take 120 g by mouth as needed (use to thicken liquids.). Patient taking differently: See admin instructions. USED TO THICKEN LIQUIDS 11/11/15  Yes Rodolph Bong, MD  Multiple Vitamins-Minerals (MULTIVITAMIN PO) Take 1 tablet by mouth daily.   Yes Historical Provider, MD  Nutritional Supplements (FEEDING SUPPLEMENT, OSMOLITE 1.5 CAL,) LIQD Place 1,000 mLs into feeding tube daily. Patient taking differently: Place 1,000 mLs into feeding tube daily. (at night for 12 hours) 09/30/15  Yes Calvert Cantor, MD  ondansetron (ZOFRAN) 4 MG tablet Take 1 tablet (4 mg total) by mouth every 8 (eight) hours as needed for nausea or vomiting. 09/07/15  Yes Sharon Seller, NP  pantoprazole (PROTONIX) 40 MG tablet TAKE 1 TABLET BY MOUTH TWICE A DAY Patient taking differently: Take 40 mg by mouth two times a day 08/11/15  Yes Kirt Boys, DO  Polyethyl Glycol-Propyl Glycol 0.4-0.3 % SOLN Place 1 drop into both eyes 4 (four) times daily.    Yes Historical Provider, MD  polyethylene glycol powder (GLYCOLAX/MIRALAX) powder MIX 17GRAMS IN 8 OUNCES OF LIQUID AND DRINK TWICE DAILY AS NEEDED Patient taking differently: Mix 17 grams into 8 ounces of juice and drink one to two times a day as needed 09/13/15  Yes Monica Carter, DO  RESTASIS 0.05 % ophthalmic emulsion USE 1 DROP INTO BOTH EYES TWICE DAILY 11/01/15  Yes Kimber Relic, MD  sertraline (ZOLOFT) 100 MG tablet Take one tablet by mouth once daily for depression Patient taking differently: Take 100 mg by mouth daily. FOR DEPRESSION 09/03/15  Yes Kirt Boys, DO    simethicone (MYLICON) 80 MG chewable tablet Chew 80 mg by mouth 4 (four) times daily as needed for flatulence.   Yes Historical Provider, MD  sodium chloride 1 g tablet TAKE 1 TABLET (1 g) BY MOUTH THREE TIMES DAILY 11/11/15  Yes Rodolph Bong, MD  traMADol (ULTRAM) 50 MG tablet Take 50 mg by mouth daily as needed for moderate pain.    Yes Historical Provider, MD  Water For Irrigation, Sterile (FREE WATER) SOLN Place 60 mLs into feeding tube every 6 (six) hours. 09/30/15  Yes Calvert Cantor, MD  NONFORMULARY OR COMPOUNDED ITEM Apply 1-2 g topically 4 (four) times daily. Peripheral Neuropathy cream: Bupivacaine 1% Doxepin 3% Gabapentin 6% Ibuprofen 3% Pentoxifylline 3% 01/20/16   Felecia Shelling, DPM    Vital Signs: BP (!) 140/59 (BP Location: Right Arm)   Pulse 73   Temp 98.1 F (36.7 C) (Oral)   Resp 16  Ht 5\' 6"  (1.676 m)   Wt 141 lb (64 kg)   SpO2 97%   BMI 22.76 kg/m   Physical Exam: Abd: GJ tube in good position, some fibrinous exudate around the tube, but no evidence of infection or leaking.  After multiple attempts with warm water, coke, etc with a toomey syringe, I was unable to get her j-tube unclogged.  Imaging: Dg Chest Port 1 View  Result Date: 01/24/2016 CLINICAL DATA:  Shortness of breath. EXAM: PORTABLE CHEST 1 VIEW COMPARISON:  11/09/2015. FINDINGS: Trachea is midline. Heart size stable. Thoracic aorta is calcified. Lungs are clear. No pleural fluid. Right hemidiaphragm is elevated. Degenerative changes are seen in the glenohumeral joints bilaterally. IMPRESSION: No acute findings. Electronically Signed   By: Leanna Battles M.D.   On: 01/24/2016 17:13    Labs:  CBC:  Recent Labs  01/24/16 1633 01/25/16 0327 01/26/16 0836 01/27/16 0318  WBC 6.4 7.1 12.4* 13.2*  HGB 9.7* 9.6* 9.6* 8.4*  HCT 28.9* 28.1* 27.3* 24.3*  PLT 370 387 406* 351    COAGS:  Recent Labs  08/02/15 1638  09/13/15 0848  09/22/15 0457  01/26/16 0836 01/26/16 1859 01/27/16 0318  01/27/16 1235  INR 1.24  --  1.37  --  1.17  --   --   --   --  1.20  APTT  --   < > 33  < > 80*  < > 102* 81* 60* 66*  < > = values in this interval not displayed.  BMP:  Recent Labs  01/24/16 1633 01/25/16 0327 01/26/16 0836 01/27/16 0318  NA 132* 131* 132* 134*  K 4.9 4.8 4.3 3.6  CL 100* 97* 95* 99*  CO2 21* 22 20* 22  GLUCOSE 95 206* 169* 170*  BUN 44* 49* 69* 72*  CALCIUM 10.1 9.7 10.2 9.6  CREATININE 1.51* 1.82* 2.15* 1.86*  GFRNONAA 29* 23* 19* 23*  GFRAA 34* 27* 22* 26*    LIVER FUNCTION TESTS:  Recent Labs  09/17/15 0415 10/06/15 1151 11/06/15 1612 01/24/16 1633  BILITOT 0.6 0.2 0.5 0.3  AST 23 19 26  39  ALT 16 12 23 26   ALKPHOS 40 47 49 68  PROT 5.8* 7.1 7.6 8.1  ALBUMIN 2.5* 3.5 3.6 3.8    Assessment and Plan: 1. Clogged J-tube -we would like to bring the patient to the IR suite where we can try and flush her drain with a smaller 10cc syringe with an adapter to get a seal.  This will allow more force behind the flush to hopefully unclog this tube so that we don't have to exchange it.  If we can not get it unclogged, then we will proceed with an exchange.    Electronically Signed: Letha Cape 01/27/2016, 1:25 PM   I spent a total of 15 Minutes at the the patient's bedside AND on the patient's hospital floor or unit, greater than 50% of which was counseling/coordinating care for clogged J-tube

## 2016-01-27 NOTE — Progress Notes (Signed)
ANTICOAGULATION CONSULT NOTE - Follow Up Consult  Pharmacy Consult for heparin Indication: Afib and DVT  Labs:  Recent Labs  01/25/16 0327 01/26/16 0836 01/26/16 1041 01/26/16 1537 01/26/16 1859 01/26/16 2129 01/27/16 0318  HGB 9.6* 9.6*  --   --   --   --  8.4*  HCT 28.1* 27.3*  --   --   --   --  24.3*  PLT 387 406*  --   --   --   --  351  APTT  --  102*  --   --  81*  --  60*  HEPARINUNFRC  --  >2.20*  --   --  2.04*  --  1.00*  CREATININE 1.82* 2.15*  --   --   --   --  1.86*  TROPONINI  --   --  <0.03 <0.03  --  <0.03  --     Assessment: 82yo female now below goal on heparin after two PTTs at goal; no gtt issues per RN.  Goal of Therapy:  Heparin level 66-102 units/ml   Plan:  Will increase heparin gtt by 1-2 units/kg/hr to 1000 units/hr and check PTT in 8hr.  Wynona Neat, PharmD, BCPS  01/27/2016,4:52 AM

## 2016-01-27 NOTE — Progress Notes (Signed)
Echocardiogram 2D Echocardiogram has been performed.  Shannon [name] 01/27/2016, 4:35 PM

## 2016-01-27 NOTE — Progress Notes (Signed)
J tube unclogged and flushed with 69ml saline ready to use.

## 2016-01-27 NOTE — Progress Notes (Signed)
Patient Name: Shannon [name] Date of Encounter: 01/27/2016  Primary Cardiologist: Dr. Rhona Raider Problem List     Active Problems:   Gastric outlet obstruction   Asthma with allergic rhinitis   Acute on chronic diastolic heart failure (HCC)   Hypothyroidism due to medication   Atrial flutter (HCC)   Chronic diastolic CHF (congestive heart failure) (HCC)   Acute renal insufficiency   Acute deep vein thrombosis (DVT) of left lower extremity (HCC)   Acute respiratory failure with hypoxia (HCC)   Respiratory alkalosis   Subjective   Breathing is ok this morning. Still with wheezing.    Inpatient Medications    Scheduled Meds: . acetaminophen  650 mg Oral BID  . amiodarone  400 mg Oral BID  . amLODipine  10 mg Oral Daily  . atorvastatin  10 mg Oral q1800  . B-complex with vitamin C  1 tablet Oral Daily  . calcium-vitamin D  1 tablet Oral Q breakfast  . chlorhexidine  15 mL Mouth/Throat BID  . cycloSPORINE  1 drop Both Eyes BID  . feeding supplement  1 Container Oral Daily  . feeding supplement (OSMOLITE 1.5 CAL)  1,000 mL Per Tube Q24H  . fluticasone  2 spray Each Nare Daily  . free water  60 mL Per Tube Q6H  . gabapentin  300 mg Oral TID  . hydrALAZINE  50 mg Oral TID  . insulin aspart  0-15 Units Subcutaneous Q4H  . ipratropium-albuterol  3 mL Nebulization TID  . isosorbide mononitrate  30 mg Oral Daily  . latanoprost  1 drop Both Eyes QHS  . levothyroxine  137 mcg Oral QAC breakfast  . methylPREDNISolone (SOLU-MEDROL) injection  40 mg Intravenous Daily  . mometasone-formoterol  2 puff Inhalation BID  . pantoprazole  40 mg Oral BID  . polyethylene glycol  17 g Oral BID  . polyvinyl alcohol  1 drop Both Eyes QID  . sertraline  100 mg Oral QHS  . sodium chloride flush  3 mL Intravenous Q12H  . sodium chloride flush  3 mL Intravenous Q12H   Continuous Infusions: . sodium chloride    . amiodarone Stopped (01/26/16 1524)  . heparin 1,000 Units/hr (01/27/16  0454)   PRN Meds: sodium chloride, acetaminophen, bisacodyl, levalbuterol, ondansetron (ZOFRAN) IV, ondansetron, RESOURCE THICKENUP CLEAR, simethicone, sodium chloride flush, sodium chloride flush, traMADol   Vital Signs    Vitals:   01/27/16 0000 01/27/16 0011 01/27/16 0500 01/27/16 0800  BP: (!) 148/60 (!) 148/60 (!) 142/71 (!) 140/59  Pulse: 79 83 81 73  Resp: 15 15 14 16   Temp: 98.3 F (36.8 C)  97.6 F (36.4 C) 98.1 F (36.7 C)  TempSrc: Oral  Oral Oral  SpO2: 100% 100% 100% 100%  Weight:   141 lb (64 kg)   Height:        Intake/Output Summary (Last 24 hours) at 01/27/16 0905 Last data filed at 01/27/16 0700  Gross per 24 hour  Intake          1044.68 ml  Output             1601 ml  Net          -556.32 ml   Filed Weights   01/25/16 0517 01/26/16 0411 01/27/16 0500  Weight: 151 lb 9.6 oz (68.8 kg) 145 lb 1.6 oz (65.8 kg) 141 lb (64 kg)    Physical Exam    GEN: Older, frail AA female.   HEENT: Grossly normal.  Neck: Supple, no JVD, carotid bruits, or masses. Cardiac: RRR, 2/6 systolic murmurs, rubs, or gallops. No clubbing, cyanosis, edema.  Radials/DP/PT 2+ and equal bilaterally.  Respiratory:  Diminished bilaterally, expiratory wheezing, R>L GI: Soft, nontender, nondistended, BS + x 4. G-tube in place. MS: no deformity or atrophy. Skin: warm and dry, no rash. 1+ LEE bilaterally. Neuro:  Strength and sensation are intact. Psych: AAOx3.  Normal affect.  Labs    CBC  Recent Labs  01/24/16 1633  01/26/16 0836 01/27/16 0318  WBC 6.4  < > 12.4* 13.2*  NEUTROABS 3.8  --   --   --   HGB 9.7*  < > 9.6* 8.4*  HCT 28.9*  < > 27.3* 24.3*  MCV 86.8  < > 83.2 84.1  PLT 370  < > 406* 351  < > = values in this interval not displayed. Basic Metabolic Panel  Recent Labs  01/26/16 0836 01/27/16 0318  NA 132* 134*  K 4.3 3.6  CL 95* 99*  CO2 20* 22  GLUCOSE 169* 170*  BUN 69* 72*  CREATININE 2.15* 1.86*  CALCIUM 10.2 9.6   Liver Function Tests  Recent  Labs  01/24/16 1633  AST 39  ALT 26  ALKPHOS 68  BILITOT 0.3  PROT 8.1  ALBUMIN 3.8   No results for input(s): LIPASE, AMYLASE in the last 72 hours. Cardiac Enzymes  Recent Labs  01/26/16 1041 01/26/16 1537 01/26/16 2129  TROPONINI <0.03 <0.03 <0.03    Telemetry    SR - Personally Reviewed  ECG    N/a- Personally Reviewed  Radiology    No results found.  Cardiac Studies     Patient Profile     Shannon [name] is a 82 y.o. female with a history of paroxysmal atrial fibrillation on amiodarone and Eliquis as well as a history of hypothyroidism,bradycardia, non-obstructive coronary artery disease, type 2 diabetes, hypertension, cerebro-vascular disease and COPD/ashtma who was admitted from the office with atrial flutter and dyspnea. Planned for DCCV but converted to SR spontaneously.   Assessment & Plan    1. Atrial Flutter: Placed on IV amiodarone on admission. Converted to SR spontaneously. Converted from IV amio to PO yesterday. On IV heparin.   2. Acute on Chronic diastolic HF/dyspnea: Seemed to have been 2/2 atrial flutter. Has diuresed briskly. Remains on [place].   -- was on IV lasix, but this was held yesterday as her Cr increased to 2. Still had 1.6L UOP yesterday. Weight is down 145>>141.  -- concern regarding possibility of PE, echo is pending to look for right heart strain. Trops neg x3 thus far.   3. Asthma: Likely with acute exacerbation on admission. Breathing has improved. Expiratory wheezing R>L today. Continues to receive scheduled nebs. Weaning IV steroids.   4. H/o GOO s/p g-tube: There was concern about pus around site, IM asked to assist with management. IR consulted and noted that tube was working properly. She only takes full liquids PO.  5. Hypothyroidism: Last TSH was 42 9/17, medications have been adjusted by PCP. TSH 8.7 yesterday.  6.  DVT LL extremity: Noted yesterday afternoon. Eliquis was held and placed on IV heparin per MD. Given she  developed a DVT while on Eliquis, seems she will need to be transitioned to different therapy. Coumadin does not seem to be ideal as her age and renal function are a factor. -- oncology recommending Xarelto.   7.  Acute renal insufficiency: Cr improved today. Monitor BMET  Signed, Reino Bellis,  NP -C 01/27/2016, 9:05 AM   The patient was seen, examined and discussed with Reino Bellis, NP-C and I agree with the above.   This is a very pleasant 82 year old female that is very well-known to me, she has history of chronic diastolic heart failure and new diagnoses of atrial flutter. She presented to the clinic yesterday with acute onset shortness of breath and was found to be in acute on chronic diastolic heart failure that is believed to be secondary to new onset atrial flutter. She spontaneously cardioverted yesterday. She diuresed well, now off diuretics, appears euvolemic, but still significantly SOB with labored breathing that is most probably driven by her underlying asthma. She is on second day of solumedrol, we will continue along with breathing treatments.   She was also diagnosed with DVT on Eliquis, hematology recommended Xarelto, however GFR < 30, we will start coumadin.  Ena Dawley, MD 01/27/2016

## 2016-01-27 NOTE — Progress Notes (Signed)
   01/27/16 1423  Clinical Encounter Type  Visited With Patient  Visit Type Initial  Referral From Chaplain  Consult/Referral To Chaplain  Recommendations followup  Stress Factors  Patient Stress Factors Not reviewed;Other (Comment) (Patient was out for intervention)  Pt. Was not available for a visit, out for intervention, will check back, follow-up.

## 2016-01-27 NOTE — Progress Notes (Signed)
Notes reviewed DVT: On Eliquis Now on Heparin drip. Ideal option is Lovenox but comes with logistical challenges From the notes, it appears pulm recommended coumadin. If theres a good way to monitor the patient, that's quite reasonable. Thank you. Hematology Sign off

## 2016-01-27 NOTE — Telephone Encounter (Signed)
Left message on 10/26 asking if pt can schedule AWV with labs on 11/8. VDM (DD)

## 2016-01-27 NOTE — Progress Notes (Signed)
Spoke with NP about removing Foley. NP stated to keep foley in over night because of patient's respiratory status. Pt does get SOB on exertion. Will cont to monitor pt.

## 2016-01-27 NOTE — Progress Notes (Signed)
Name: Shannon [name] MRN: [id number] DOB: October 03, 1923    ADMISSION DATE:  01/24/2016 CONSULTATION DATE:  01/24/16  REFERRING MD :  Dr. Delton See   CHIEF COMPLAINT:  Respiratory Failure    HISTORY OF PRESENT ILLNESS:  82 y/o F, never smoker (chewed tobacco) with PMH of anemia, arthritis, depression, GERD, gastric stenosis, PUD, ventral hernia s/p repair, colectomy,  hypothyroidism, DM II, CAD, HTN, HLD, PAF (on amiodarone / Eliquis), bradycardia, CVA, & asthma / COPD who presented to [facility] on 10/23 with reports of shortness of breath.    The patient was admitted to the hospital in August for bradycardia & weakness.  She was taken off her labetalol and amiodarone was reduced during that admit.    The patient reports she felt as though her asthma had been "acting up" with worsening shortness of breath despite increased inhaler use.  She reported variable heart rate - HR in the 50's then would increase to 110's.  She also had noticed progressive swelling in her lower extremities.  She was seen in the Cardiology office on 10/23 and found to be in rapid atrial flutter with O2 sats of 87%.  She was directly admitted from the Cardiology office for rate control.  The patient was started on IV amiodarone.  She initially improved but afternoon 10/24, she developed increased SOB / work of breathing and was placed on bipap.  ABG assessment demonstrated ph 7.622, undetectable CO2, and bicarbonate of 15.3.    SUBJECTIVE: Degree of dyspnea improving steadily per patient and daughter at bedside. Patient denies any chest pain or pressure. She does report intermittent coughing productive of a cloudy mucus but no hemoptysis.  REVIEW OF SYSTEMS: No subjective fever or chills. Patient is having maxillary sinus pain as well as pain in her left upper jaw and left ear. Reports increasing sinus congestion. Denies any other headache or vision changes.  VITAL SIGNS: Temp:  [97.6 F (36.4 C)-98.3 F (36.8 C)] 98.1 F (36.7 C)  (10/26 0800) Pulse Rate:  [73-87] 73 (10/26 0800) Resp:  [14-21] 16 (10/26 0800) BP: (120-150)/(48-100) 140/59 (10/26 0800) SpO2:  [97 %-100 %] 97 % (10/26 0925) Weight:  [141 lb (64 kg)] 141 lb (64 kg) (10/26 0500)  PHYSICAL EXAMINATION: General:  Awake. Alert. Daughter at bedside. Integument:  Warm & dry. No rash on exposed skin.  HEENT:  Moist mucus membranes. Mostly edentulous with some upper front teeth. No scleral icterus. Mild tenderness to palpation of left maxillary sinus. Cardiovascular:  Regular rate and rhythm. No edema.  Pulmonary:  Clear bilaterally to auscultation. Normal work of breathing on nasal cannula oxygen. Abdomen: Soft. Normal bowel sounds. Noontender. Musculoskeletal:  Normal bulk and tone. No effusion appreciated.   Recent Labs Lab 01/25/16 0327 01/26/16 0836 01/27/16 0318  NA 131* 132* 134*  K 4.8 4.3 3.6  CL 97* 95* 99*  CO2 22 20* 22  BUN 49* 69* 72*  CREATININE 1.82* 2.15* 1.86*  GLUCOSE 206* 169* 170*    Recent Labs Lab 01/25/16 0327 01/26/16 0836 01/27/16 0318  HGB 9.6* 9.6* 8.4*  HCT 28.1* 27.3* 24.3*  WBC 7.1 12.4* 13.2*  PLT 387 406* 351   No results found.  SIGNIFICANT EVENTS  10/23  Admit with Atrial flutter  STUDIES:  BLE venous dopplers 10/24: There is evidence of deep vein thrombosis involving the left lower extremity. Deep vein thrombosis involves the popliteal vein, peroneal, and posterior tibial veins of the left lower extremity. TTE >>>  ASSESSMENT / PLAN:  1.  Left lower extremity acute DVT: Continuing heparin drip. Recommend eventual transition to Coumadin given development of clot on Eliquis. No role for CT angiogram of the chest at this time. Defer on further workup for occult malignancy and other causes to primary service. 2. Acute Hypoxic Respiratory Failure: Improving. Recommend weaning for saturation greater than 92%. Defer to cardiology on further diuresis. Suspect possible underlying pulmonary embolism.  Transthoracic echocardiogram pending. 3. Left Maxillary Sinus Pain: Checking maxillofacial CT without contrast. 4. Respiratory Alkalosis: Appears to have some mild chronic respiratory alkalosis at baseline. Likely exacerbated by over ventilation from noninvasive positive pressure ventilation. Recommend avoiding noninvasive positive pressure ventilation at this time.  Remainder of care as per cardiology and primary service.  Donna Christen Jamison Neighbor, M.D. [facility] Pager:  [phone] After 3pm or if no response, call [phone] 12:03 PM 01/27/16

## 2016-01-27 NOTE — Progress Notes (Signed)
Patient's J-port of feeding tube not flushing.  Saverio Danker PA paged and notified.  Stated she would be by to see patient some time today.  Sanda Linger

## 2016-01-28 ENCOUNTER — Inpatient Hospital Stay (HOSPITAL_COMMUNITY): Payer: Medicare Other

## 2016-01-28 DIAGNOSIS — J302 Other seasonal allergic rhinitis: Secondary | ICD-10-CM

## 2016-01-28 DIAGNOSIS — J4552 Severe persistent asthma with status asthmaticus: Secondary | ICD-10-CM

## 2016-01-28 LAB — CBC
HCT: 25.1 % — ABNORMAL LOW (ref 36.0–46.0)
Hemoglobin: 8.5 g/dL — ABNORMAL LOW (ref 12.0–15.0)
MCH: 28.8 pg (ref 26.0–34.0)
MCHC: 33.9 g/dL (ref 30.0–36.0)
MCV: 85.1 fL (ref 78.0–100.0)
Platelets: 359 10*3/uL (ref 150–400)
RBC: 2.95 MIL/uL — ABNORMAL LOW (ref 3.87–5.11)
RDW: 16.5 % — ABNORMAL HIGH (ref 11.5–15.5)
WBC: 10.8 10*3/uL — ABNORMAL HIGH (ref 4.0–10.5)

## 2016-01-28 LAB — PROTIME-INR
INR: 1.2
Prothrombin Time: 15.3 seconds — ABNORMAL HIGH (ref 11.4–15.2)

## 2016-01-28 LAB — GLUCOSE, CAPILLARY
Glucose-Capillary: 170 mg/dL — ABNORMAL HIGH (ref 65–99)
Glucose-Capillary: 176 mg/dL — ABNORMAL HIGH (ref 65–99)
Glucose-Capillary: 186 mg/dL — ABNORMAL HIGH (ref 65–99)
Glucose-Capillary: 189 mg/dL — ABNORMAL HIGH (ref 65–99)
Glucose-Capillary: 191 mg/dL — ABNORMAL HIGH (ref 65–99)
Glucose-Capillary: 199 mg/dL — ABNORMAL HIGH (ref 65–99)
Glucose-Capillary: 309 mg/dL — ABNORMAL HIGH (ref 65–99)

## 2016-01-28 LAB — APTT: aPTT: 74 seconds — ABNORMAL HIGH (ref 24–36)

## 2016-01-28 LAB — HEPARIN LEVEL (UNFRACTIONATED): Heparin Unfractionated: 0.89 IU/mL — ABNORMAL HIGH (ref 0.30–0.70)

## 2016-01-28 MED ORDER — AZITHROMYCIN 250 MG PO TABS
500.0000 mg | ORAL_TABLET | Freq: Every day | ORAL | Status: AC
  Administered 2016-01-28: 500 mg via ORAL
  Filled 2016-01-28: qty 2

## 2016-01-28 MED ORDER — FLUTICASONE PROPIONATE 50 MCG/ACT NA SUSP
1.0000 | Freq: Two times a day (BID) | NASAL | Status: DC
  Administered 2016-01-28 – 2016-02-02 (×10): 1 via NASAL
  Filled 2016-01-28: qty 16

## 2016-01-28 MED ORDER — ERYTHROMYCIN 5 MG/GM OP OINT
TOPICAL_OINTMENT | Freq: Three times a day (TID) | OPHTHALMIC | Status: DC
  Administered 2016-01-28 (×2): 1 via OPHTHALMIC
  Administered 2016-01-29: 14:00:00 via OPHTHALMIC
  Administered 2016-01-29: 1 via OPHTHALMIC
  Administered 2016-01-29 – 2016-02-02 (×11): via OPHTHALMIC
  Filled 2016-01-28: qty 3.5

## 2016-01-28 MED ORDER — AZITHROMYCIN 250 MG PO TABS
250.0000 mg | ORAL_TABLET | Freq: Every day | ORAL | Status: AC
  Administered 2016-01-29 – 2016-02-01 (×4): 250 mg via ORAL
  Filled 2016-01-28 (×4): qty 1

## 2016-01-28 MED ORDER — WARFARIN SODIUM 2 MG PO TABS
2.0000 mg | ORAL_TABLET | Freq: Once | ORAL | Status: AC
  Administered 2016-01-28: 2 mg via ORAL
  Filled 2016-01-28: qty 1

## 2016-01-28 NOTE — Progress Notes (Addendum)
Patient Name: Shannon [name] Date of Encounter: 01/28/2016  Primary Cardiologist: Dr. Rhona Raider Problem List     Active Problems:   Gastric outlet obstruction   Asthma with allergic rhinitis   Acute on chronic diastolic heart failure (HCC)   Hypothyroidism due to medication   Atrial flutter (HCC)   Chronic diastolic CHF (congestive heart failure) (HCC)   Acute renal insufficiency   Acute deep vein thrombosis (DVT) of left lower extremity (HCC)   Acute respiratory failure with hypoxia (HCC)   Respiratory alkalosis   Sinus pain   Subjective   Breathing still seems to be labored this morning. C/o bilateral eye burning and crusting.    Inpatient Medications    Scheduled Meds: . acetaminophen  650 mg Oral BID  . amiodarone  400 mg Oral BID  . amLODipine  10 mg Oral Daily  . atorvastatin  10 mg Oral q1800  . B-complex with vitamin C  1 tablet Oral Daily  . calcium-vitamin D  1 tablet Oral Q breakfast  . chlorhexidine  15 mL Mouth/Throat BID  . Chlorhexidine Gluconate Cloth  6 each Topical Q0600  . cycloSPORINE  1 drop Both Eyes BID  . erythromycin   Both Eyes Q8H  . feeding supplement  1 Container Oral Daily  . feeding supplement (OSMOLITE 1.5 CAL)  1,000 mL Per Tube Q24H  . fluticasone  2 spray Each Nare Daily  . free water  60 mL Per Tube Q6H  . gabapentin  300 mg Oral TID  . hydrALAZINE  50 mg Oral TID  . insulin aspart  0-15 Units Subcutaneous Q4H  . ipratropium-albuterol  3 mL Nebulization TID  . isosorbide mononitrate  30 mg Oral Daily  . latanoprost  1 drop Both Eyes QHS  . levothyroxine  137 mcg Oral QAC breakfast  . methylPREDNISolone (SOLU-MEDROL) injection  40 mg Intravenous Daily  . mometasone-formoterol  2 puff Inhalation BID  . mupirocin ointment  1 application Nasal BID  . pantoprazole  40 mg Oral BID  . polyethylene glycol  17 g Oral BID  . polyvinyl alcohol  1 drop Both Eyes QID  . sertraline  100 mg Oral QHS  . sodium chloride flush  3 mL  Intravenous Q12H  . sodium chloride flush  3 mL Intravenous Q12H  . warfarin  2 mg Oral ONCE-1800  . warfarin   Does not apply Once  . Warfarin - Pharmacist Dosing Inpatient   Does not apply q1800   Continuous Infusions: . sodium chloride    . heparin 1,000 Units/hr (01/28/16 0429)   PRN Meds: sodium chloride, acetaminophen, benzonatate, bisacodyl, levalbuterol, ondansetron (ZOFRAN) IV, ondansetron, RESOURCE THICKENUP CLEAR, simethicone, sodium chloride flush, sodium chloride flush, traMADol   Vital Signs    Vitals:   01/28/16 0817 01/28/16 0900 01/28/16 0935 01/28/16 0936  BP: (!) 139/59     Pulse:      Resp:      Temp:  97.2 F (36.2 C)    TempSrc:  Oral    SpO2:   100% 100%  Weight:      Height:        Intake/Output Summary (Last 24 hours) at 01/28/16 0936 Last data filed at 01/28/16 0432  Gross per 24 hour  Intake              363 ml  Output             1800 ml  Net            -  1437 ml   Filed Weights   01/26/16 0411 01/27/16 0500 01/28/16 0421  Weight: 145 lb 1.6 oz (65.8 kg) 141 lb (64 kg) 139 lb 3.2 oz (63.1 kg)    Physical Exam    GEN: Older, frail AA female.   HEENT: Grossly normal.  Neck: Supple, no JVD, carotid bruits, or masses. Cardiac: RRR, 2/6 systolic murmurs, rubs, or gallops. No clubbing, cyanosis, edema.  Radials/DP/PT 2+ and equal bilaterally.  Respiratory:  Diminished bilaterally, expiratory wheezing, R>L GI: Soft, nontender, nondistended, BS + x 4. G-tube in place. MS: no deformity or atrophy. Skin: warm and dry, no rash. 1+ LEE bilaterally. Neuro:  Strength and sensation are intact. Psych: AAOx3.  Normal affect.  Labs    CBC  Recent Labs  01/27/16 0318 01/28/16 0324  WBC 13.2* 10.8*  HGB 8.4* 8.5*  HCT 24.3* 25.1*  MCV 84.1 85.1  PLT 351 109   Basic Metabolic Panel  Recent Labs  01/26/16 0836 01/27/16 0318  NA 132* 134*  K 4.3 3.6  CL 95* 99*  CO2 20* 22  GLUCOSE 169* 170*  BUN 69* 72*  CREATININE 2.15* 1.86*    CALCIUM 10.2 9.6   Liver Function Tests No results for input(s): AST, ALT, ALKPHOS, BILITOT, PROT, ALBUMIN in the last 72 hours. No results for input(s): LIPASE, AMYLASE in the last 72 hours. Cardiac Enzymes  Recent Labs  01/26/16 1041 01/26/16 1537 01/26/16 2129  TROPONINI <0.03 <0.03 <0.03    Telemetry    SR with PACs - Personally Reviewed  ECG    N/a- Personally Reviewed  Radiology    Ir Margarita Grizzle Obstruc Mat Any Colon Tube W/fluoro  Result Date: 01/27/2016 INDICATION: The jejunal portion of the GJ tube is occluded. EXAM: MECH REMOVAL GI TUBE OBSTRUCTION MEDICATIONS: None ANESTHESIA/SEDATION: None CONTRAST:  None FLUOROSCOPY TIME:  None COMPLICATIONS: None immediate. PROCEDURE: The jejunal tube was occluded. Adaptor piece was placed at the end of the jejunal tube and this tube was flushed with a 10 mL syringe by the IR technologist. The tube eventually cleared using this technique. The jejunal tube was flushed with saline. The gastric tube was also flushed to ensure patency. IMPRESSION: The jejunal portion of the GJ tube was successfully unclogged. Electronically Signed   By: Markus Daft M.D.   On: 01/27/2016 15:16   Ct Maxillofacial Ltd Wo Cm  Result Date: 01/27/2016 CLINICAL DATA:  Pain behind left eye and left year. EXAM: CT PARANASAL SINUS LIMITED WITHOUT CONTRAST TECHNIQUE: Non-contiguous multidetector CT images of the paranasal sinuses were obtained in a single plane without contrast. COMPARISON:  Head CT 11/06/2015 FINDINGS: There is no paranasal sinus mucosal thickening. The sinus drainage pathways are patent. There is a right concha bullosa. Unremarkable visualized intracranial structures. Atherosclerotic calcification within the proximal intracranial internal carotid arteries and vertebral arteries. Normal appearance of the orbits. IMPRESSION: No evidence of acute or chronic sinusitis. Electronically Signed   By: Ulyses Jarred M.D.   On: 01/27/2016 18:16    Cardiac  Studies     Patient Profile     Shannon [name] is a 82 y.o. female with a history of paroxysmal atrial fibrillation on amiodarone and Eliquis as well as a history of hypothyroidism,bradycardia, non-obstructive coronary artery disease, type 2 diabetes, hypertension, cerebro-vascular disease and COPD/ashtma who was admitted from the office with atrial flutter and dyspnea. Planned for DCCV but converted to SR spontaneously.   Assessment & Plan    1. Atrial Flutter: Placed on IV amiodarone on  admission. Converted to SR spontaneously. Converted from IV amio to PO. On IV heparin. Started on coumadin yesterday with continued heparin bridge.   2. Acute on Chronic diastolic HF/dyspnea: Seemed to have been 2/2 atrial flutter. Has diuresed briskly. Remains on [place].   -- was on IV lasix, but this was held as her Cr increased to 2. Had 1.8L UOP yesterday. Weight down 10lbs over this admission. Now appears euvolemic. Continue to hold diuretics.  -- concern regarding possibility of PE, echo yesterday showed normal EF with no WMA or heart strain. Both mild AR, and MR.   3. Asthma: Likely with acute exacerbation on admission. Breathing has improved but still appears dyspneic at rest. Expiratory wheezing R>L , seems slightly worse today. Continues to receive scheduled nebs and IV steroids.  -- Will repeat CXR now.   4. H/o GOO s/p g-tube: There was concern about pus around site, IM asked to assist with management. IR consulted and noted that tube was working properly. J-tube was clogged yesterday, but able to be fixed by IR. She only takes full liquids PO.  5. Hypothyroidism: Last TSH was 42 9/17, medications have been adjusted by PCP. TSH 8.7 yesterday.  6.  DVT LL extremity: Noted yesterday afternoon. Eliquis was held and placed on IV heparin per MD. Given she developed a DVT while on Eliquis, seems she will need to be transitioned to different therapy. She has been transitioned to coumadin with heparin bridge.     7.  Acute renal insufficiency: Cr improved today. Monitor BMET  8. Bilateral Conjunctivitis: Will start erythromycin   Signed, Reino Bellis, NP -C 01/28/2016, 9:36 AM   The patient was seen, examined and discussed with Reino Bellis, NP-C and I agree with the above.   This is a very pleasant 82 year old female that is very well-known to me, she has history of chronic diastolic heart failure and new diagnoses of atrial flutter. She presented to the clinic yesterday with acute onset shortness of breath and was found to be in acute on chronic diastolic heart failure that is believed to be secondary to new onset atrial flutter. CHADS-VASc =6. She spontaneously cardioverted. She diuresed well, now off diuretics, appears euvolemic, but still significantly SOB with labored breathing. We treated underlying asthma with no improvement. She has acute DVT and most probably PE, however on Heparin already. With dg of DVT while on Eliquis, hematology recommended Xarelto, however GFR < 30, we started coumadin. Echo yesterday showed normal LV and RV function, no signs of strain with possible pulmonary embolism. We will obtain chest X ray to evaluate for possible pneumonia, also Obtain ABGs.  Call pulmonary if necessary.   Ena Dawley, MD 01/28/2016

## 2016-01-28 NOTE — Evaluation (Signed)
Occupational Therapy Evaluation Patient Details Name: Shannon [name] MRN: [id number] DOB: [birth year] Today's Date: 01/28/2016    History of Present Illness Shannon [name] is a 82 y.o. female with a history A-fib, hypothyroidism,bradycardia, non-obstructive coronary artery disease, DM, hypertension, cerebro-vascular disease and COPD/ashma. Pt admitted with Patient comes in today extremely short of breath and found to have Atrial flutter, acute on chronic DHF, acute exacerbation of asthma, and essential HTN   Clinical Impression   This 82 yo female admitted with above presents to acute OT with deficits below (see OT problem list) thus affecting her PLOF of being fairly Mod I with B/D and Mod I with toileting pta. She will benefit from acute OT with follow up HHOT to work back towards PLOF.    Follow Up Recommendations  Home health OT;Supervision/Assistance - 24 hour    Equipment Recommendations  None recommended by OT       Precautions / Restrictions Precautions Precautions: Fall Precaution Comments: GJ tube, currently requiring O2 (but not on it pta) Restrictions Weight Bearing Restrictions: No      Mobility Bed Mobility               General bed mobility comments: Pt up in recliner upon my arrival  Transfers Overall transfer level: Needs assistance Equipment used: Rolling walker (2 wheeled) Transfers: Sit to/from Stand Sit to Stand: Min assist              Balance Overall balance assessment: Needs assistance Sitting-balance support: No upper extremity supported;Feet supported Sitting balance-Leahy Scale: Good     Standing balance support: Bilateral upper extremity supported Standing balance-Leahy Scale: Poor Standing balance comment: reliant on RW                             ADL Overall ADL's : Needs assistance/impaired Eating/Feeding: Independent;Sitting Eating/Feeding Details (indicate cue type and reason): also has tube feeds Grooming: Set  up;Sitting   Upper Body Bathing: Set up;Sitting   Lower Body Bathing: Moderate assistance (min A sit<>stand)   Upper Body Dressing : Minimal assistance;Sitting   Lower Body Dressing: Maximal assistance (min A sit<>stand)   Toilet Transfer: Minimal assistance;Stand-pivot;RW;BSC   Toileting- Clothing Manipulation and Hygiene: Minimal assistance;Sit to/from stand         General ADL Comments: Pt normally bends forward to don LB clothing, cannot do that now due to increased work of breathing and SOB--reports family can A her more if she needs it               Pertinent Vitals/Pain Pain Assessment: 0-10 Pain Score: 5  Pain Location: left shoulder and neck. left eye burning Pain Descriptors / Indicators: Sore;Burning Pain Intervention(s): Monitored during session;Limited activity within patient's tolerance     Hand Dominance Right   Extremity/Trunk Assessment Upper Extremity Assessment Upper Extremity Assessment: Generalized weakness           Communication Communication Communication: No difficulties   Cognition Arousal/Alertness: Awake/alert Behavior During Therapy: WFL for tasks assessed/performed Overall Cognitive Status: Within Functional Limits for tasks assessed                                Home Living Family/patient expects to be discharged to:: Private residence Living Arrangements: Children Available Help at Discharge: Family;Available 24 hours/day Type of Home: House Home Access: Ramped entrance     Home Layout: One level  Bathroom Shower/Tub: Curtain;Tub/shower unit         Home Equipment: Environmental consultant - 4 wheels;Cane - single point;Shower seat;Hand held Careers information officer - 2 wheels          Prior Functioning/Environment Level of Independence: Needs assistance  Gait / Transfers Assistance Needed: Uses rollator  ADL's / Homemaking Assistance Needed: family assists with tub tranfers and basic ADLs prn            OT  Problem List: Decreased strength;Pain;Impaired balance (sitting and/or standing)   OT Treatment/Interventions: Self-care/ADL training;Patient/family education;DME and/or AE instruction;Balance training    OT Goals(Current goals can be found in the care plan section) Acute Rehab OT Goals Patient Stated Goal: to go home OT Goal Formulation: With patient Time For Goal Achievement: 02/11/16 Potential to Achieve Goals: Good  OT Frequency: Min 2X/week              End of Session Equipment Utilized During Treatment: Rolling walker  Activity Tolerance: Patient tolerated treatment well Patient left: in chair;with call bell/phone within reach   Time: 9147-8295 OT Time Calculation (min): 13 min Charges:  OT General Charges $OT Visit: 1 Procedure OT Evaluation $OT Eval Moderate Complexity: 1 Procedure  Evette Georges [phone] 01/28/2016, 4:49 PM

## 2016-01-28 NOTE — Progress Notes (Signed)
[facility]                                                                              Patient Demographics  Shannon [name], is a 82 y.o. female, DOB - 09-12-1923, ZOX:[id number]  Admit date - 01/24/2016   Admitting Physician Kathleene Hazel, MD  Outpatient Primary MD for the patient is Kirt Boys, DO  Outpatient specialists:   LOS - 4  days    No chief complaint on file.      Brief summary   Shannon Smithis an 82 y.o.femaleWith PMHx of a fib, asthma, hypothyroid who was directly admitted from the cardiology office for shortness of breath. She was found to be in a flutter- rapid. Her O2 sats were 87% in the office on room air. Patient has h/o asthma and has been using her inhalers without relief for the last few days. She has increased LE edema as well.  When patient arrived in the hospital, she was tachypneic using accessory muscles and having increased shortness of breath. She was started on IV amio, placed on Bipap and given a dose of IV lasix with good UOP. Hospitalist were consulted to help with wheezing/asthma exacerbation and possible infected G tube. G tube was examined by IR and found to be working.  Patient converted back to SR on own night of 10/24.    Assessment & Plan   A flutter uncontrolled leading to acute on chronic diastolic HF- patient does not tolerate well -converted to NSR - amiodarone per cards, cardioversion cancelled, Management per cardiology  Gastric outlet obstruction with recent g tube placement at [place], clogged J tube  - patient just had tube replaced by IR on 10/5-- has granulation tissue- appreciate IR Assistance. If unable to unclog, plan to exchange. - Per patient overnight received tube feedings without any trouble and she was eating full liquid diet this morning  Asthma, acute hypoxic respiratory failure -received  IV lasix (suspect related to volume overload and atrial flutter) -nebs scheduled  and PRN, steroids, heparin drip, on Coumadin pulmonology following - 2-D echo EF 55-60%, grade 1 diastolic dysfunction - Chest x-ray with small pleural effusion otherwise no pneumonia or significant pulmonary edema  Hypothyroidism -continue home meds - TSH 8.7 with free T4 1.46 (slightly elevated), need to repeat these labs in 4 weeks and adjust Synthroid dose  DVT on eliquis - pulmonology recommending transition to Coumadin given development of clot on eliquis - Continue heparin, started on Coumadin  Code Status:  DVT Prophylaxis:  heparin  Family Communication: Discussed in detail with the patient, all imaging results, lab results explained to the patient    Disposition Plan: Discussed with primary attending, Dr. Delton See. I will sign off. Pulmonology has been managing the respiratory issues.  Time Spent in minutes  20 minutes   Antimicrobials:      Medications  Scheduled Meds: . acetaminophen  650 mg Oral BID  . amiodarone  400 mg Oral BID  . amLODipine  10 mg Oral Daily  . atorvastatin  10 mg Oral q1800  . B-complex with vitamin C  1 tablet Oral Daily  . calcium-vitamin  D  1 tablet Oral Q breakfast  . chlorhexidine  15 mL Mouth/Throat BID  . Chlorhexidine Gluconate Cloth  6 each Topical Q0600  . cycloSPORINE  1 drop Both Eyes BID  . erythromycin   Both Eyes Q8H  . feeding supplement  1 Container Oral Daily  . feeding supplement (OSMOLITE 1.5 CAL)  1,000 mL Per Tube Q24H  . fluticasone  2 spray Each Nare Daily  . free water  60 mL Per Tube Q6H  . gabapentin  300 mg Oral TID  . hydrALAZINE  50 mg Oral TID  . insulin aspart  0-15 Units Subcutaneous Q4H  . ipratropium-albuterol  3 mL Nebulization TID  . isosorbide mononitrate  30 mg Oral Daily  . latanoprost  1 drop Both Eyes QHS  . levothyroxine  137 mcg Oral QAC breakfast  . methylPREDNISolone (SOLU-MEDROL) injection  40 mg Intravenous Daily  . mometasone-formoterol  2 puff Inhalation BID  . mupirocin ointment   1 application Nasal BID  . pantoprazole  40 mg Oral BID  . polyethylene glycol  17 g Oral BID  . polyvinyl alcohol  1 drop Both Eyes QID  . sertraline  100 mg Oral QHS  . sodium chloride flush  3 mL Intravenous Q12H  . sodium chloride flush  3 mL Intravenous Q12H  . warfarin  2 mg Oral ONCE-1800  . warfarin   Does not apply Once  . Warfarin - Pharmacist Dosing Inpatient   Does not apply q1800   Continuous Infusions: . sodium chloride    . heparin 1,000 Units/hr (01/28/16 0429)   PRN Meds:.sodium chloride, acetaminophen, benzonatate, bisacodyl, levalbuterol, ondansetron (ZOFRAN) IV, ondansetron, RESOURCE THICKENUP CLEAR, simethicone, sodium chloride flush, sodium chloride flush, traMADol   Antibiotics   Anti-infectives    None        Subjective:   Shannon [name] was seen and examined today. Somewhat short of breath, eating breakfast at the time of my encounter. No chest pain or fevers or chills. Per patient, received tube feeding overnight without any trouble.  Patient denies dizziness, abdominal pain, N/V/D/C, new weakness, numbess, tingling. No acute events overnight.    Objective:   Vitals:   01/28/16 0817 01/28/16 0900 01/28/16 0935 01/28/16 0936  BP: (!) 139/59     Pulse:      Resp:      Temp:  97.2 F (36.2 C)    TempSrc:  Oral    SpO2:   100% 100%  Weight:      Height:        Intake/Output Summary (Last 24 hours) at 01/28/16 1201 Last data filed at 01/28/16 0432  Gross per 24 hour  Intake              123 ml  Output             1200 ml  Net            -1077 ml     Wt Readings from Last 3 Encounters:  01/28/16 63.1 kg (139 lb 3.2 oz)  01/24/16 67.3 kg (148 lb 6.4 oz)  12/29/15 68.9 kg (151 lb 12.8 oz)     Exam  General: Alert and oriented x 3, NAD, appeared somewhat dyspneic  HEENT:  PERRLA, EOMI, Anicteric Sclera.   Neck:  Cardiovascular: S1 S2 auscultated, 2/6 systolic murmur   Respiratory: decreased BS at bases  Gastrointestinal: Soft,  nontender, nondistended, + bowel sounds, G-tube  Ext: no cyanosis clubbing or edema  Neuro:  Skin: No rashes  Psych: Normal affect and demeanor, alert and oriented x3    Data Reviewed:  I have personally reviewed following labs and imaging studies  Micro Results Recent Results (from the past 240 hour(s))  MRSA PCR Screening     Status: Abnormal   Collection Time: 01/27/16  9:09 AM  Result Value Ref Range Status   MRSA by PCR POSITIVE (A) NEGATIVE Final    Comment:        The GeneXpert MRSA Assay (FDA approved for NASAL specimens only), is one component of a comprehensive MRSA colonization surveillance program. It is not intended to diagnose MRSA infection nor to guide or monitor treatment for MRSA infections. RESULT CALLED TO, READ BACK BY AND VERIFIED WITH: Epimenio Foot RN 11:35 01/27/16 ([email or web address])     Radiology Reports Dg Cervical Spine 2 Or 3 Views  Result Date: 01/04/2016 CLINICAL DATA:  Fall.  Neck pain. EXAM: CERVICAL SPINE - 2-3 VIEW COMPARISON:  CT 11/10/2015. FINDINGS: Diffuse multilevel degenerative change. Degenerative changes most severe at C3-C4 on C5-C6. No acute abnormality identified. No evidence of fracture or dislocation. Bilateral carotid vascular calcification. Pulmonary apices are clear. IMPRESSION: 1.  Diffuse multilevel degenerative change. 2.  Bilateral carotid vascular disease . Electronically Signed   By: Maisie Fus  Register   On: 01/04/2016 08:20   Ir Gj Tube Change  Result Date: 01/07/2016 INDICATION: 82 year old female with capacious stomach and gastroparesis. She currently feeds via a percutaneous gastro jejunostomy tube. Unfortunately, the jejunal arm has pulled back into the stomach and patient is experiencing nausea and vomiting following feedings. EXAM: JEJUNAL CATHETER REPLACEMENT MEDICATIONS: None ANESTHESIA/SEDATION: None CONTRAST:  80 mL Isovue 300 - administered into the gastric lumen. FLUOROSCOPY TIME:  Fluoroscopy Time: 18 minutes 54 seconds  (305 mGy). COMPLICATIONS: None immediate. PROCEDURE: Informed written consent was obtained from the patient after a thorough discussion of the procedural risks, benefits and alternatives. All questions were addressed. Maximal Sterile Barrier Technique was utilized including caps, mask, sterile gowns, sterile gloves, sterile drape, hand hygiene and skin antiseptic. A timeout was performed prior to the initiation of the procedure. The existing percutaneous gastrojejunostomy tube was removed following deflation of the retention balloon. A C2 cobra catheter was then inserted through the ostium and navigated gastric antrum. It is extremely difficult to maneuver the catheter within the stomach. The body and fundus of the stomach are extremely capacious. A large volume of contrast and saline were injected in the region of the antrum in order to opacify and define the anatomy. Ultimately, the C2 cobra catheter was advanced deep into the gastric antrum then exchanged over a stiff Amplatz wire for a Barrett's teen catheter which was then advanced through the pylorus and into the proximal jejunum with the assistance of a road runner wire. The catheter and wire were advanced all the way to the ligament Treitz. A new 22 French percutaneous gastrojejunostomy tube was then advanced over the wire and positioned with the tip in the proximal small bowel. Unfortunately, the length of the tube is greater than the distance to the ligament of Treitz and beyond the ligament Treitz the small bowel is extremely tortuous. The final segment of gastrostomy tube pushing into the stomach created a small loop heading toward the fundus. The weighted tip remains within the proximal small bowel. This was confirmed by a gentle hand injection of contrast material. The retention balloon was inflated and pulled snug against the anterior abdominal wall. The external bumper was fixed in place. IMPRESSION: 1.  Successful replacement of a 48 French  percutaneous gastro jejunostomy tube with the tube tip in the proximal small bowel. Unfortunately, due to a size mismatch between the very long tube and the relatively small patient there is still a loop of redundancy approximately in the stomach. PLAN: Tube is currently ready for use. At some point in the future, the tip of the J arm may again migrate into the fundus of the stomach. If this happens, replacement should be either with a modified 22 French gastrojejunostomy tube which has been cut shorter (approximately 10-15 cm) or a shorter direct percutaneous jejunostomy tube given that the family really uses the gastrostomy portion. Findings were discussed with the patient's daughter voiced her understanding. Signed, Sterling Big, MD [facility] Radiology Electronically Signed   By: Malachy Moan M.D.   On: 01/07/2016 15:45   Ir Mech Vito Berger Obstruc Mat Any Colon Tube W/fluoro  Result Date: 01/27/2016 INDICATION: The jejunal portion of the GJ tube is occluded. EXAM: MECH REMOVAL GI TUBE OBSTRUCTION MEDICATIONS: None ANESTHESIA/SEDATION: None CONTRAST:  None FLUOROSCOPY TIME:  None COMPLICATIONS: None immediate. PROCEDURE: The jejunal tube was occluded. Adaptor piece was placed at the end of the jejunal tube and this tube was flushed with a 10 mL syringe by the IR technologist. The tube eventually cleared using this technique. The jejunal tube was flushed with saline. The gastric tube was also flushed to ensure patency. IMPRESSION: The jejunal portion of the GJ tube was successfully unclogged. Electronically Signed   By: Richarda Overlie M.D.   On: 01/27/2016 15:16   Dg Chest Port 1 View  Result Date: 01/28/2016 CLINICAL DATA:  Wheezing and short of breath EXAM: PORTABLE CHEST 1 VIEW COMPARISON:  01/24/2016 FINDINGS: Elevated right hemidiaphragm is chronic and unchanged. Heart size mildly enlarged. Negative for heart failure. Negative for pneumonia.  Small left pleural effusion unchanged from the prior study. Atherosclerotic calcification aortic arch IMPRESSION: Chronic elevation right hemidiaphragm. Negative for heart failure or pneumonia. Small left pleural effusion or scarring unchanged. Electronically Signed   By: Marlan Palau M.D.   On: 01/28/2016 10:51   Dg Chest Port 1 View  Result Date: 01/24/2016 CLINICAL DATA:  Shortness of breath. EXAM: PORTABLE CHEST 1 VIEW COMPARISON:  11/09/2015. FINDINGS: Trachea is midline. Heart size stable. Thoracic aorta is calcified. Lungs are clear. No pleural fluid. Right hemidiaphragm is elevated. Degenerative changes are seen in the glenohumeral joints bilaterally. IMPRESSION: No acute findings. Electronically Signed   By: Leanna Battles M.D.   On: 01/24/2016 17:13   Dg Abd 2 Views  Result Date: 01/04/2016 CLINICAL DATA:  Abdominal bloating and pain near GJ tube for a few weeks. EXAM: ABDOMEN - 2 VIEW COMPARISON:  Abdominal radiograph and CT abdomen and pelvis 11/06/2015 FINDINGS: The gastrojejunostomy tube is coiled in the stomach with tip at the level of the proximal gastric body. The tube extended into the duodenum on the prior CT. Gas is present in nondilated colon. No dilated loops of bowel are seen to suggest obstruction. There is no evidence of intraperitoneal free air. Right upper quadrant surgical clips are present, and a bowel anastomosis is present in the left mid abdomen. The visualized lung bases are grossly clear. No acute osseous abnormality is identified. IMPRESSION: 1. GJ tube coiled in the stomach. The tip was in the duodenum on the prior CT. 2. No evidence of bowel obstruction. Electronically Signed   By: Sebastian Ache M.D.   On: 01/04/2016 08:25  Ct Maxillofacial Ltd Wo Cm  Result Date: 01/27/2016 CLINICAL DATA:  Pain behind left eye and left year. EXAM: CT PARANASAL SINUS LIMITED WITHOUT CONTRAST TECHNIQUE: Non-contiguous multidetector CT images of the paranasal sinuses were obtained  in a single plane without contrast. COMPARISON:  Head CT 11/06/2015 FINDINGS: There is no paranasal sinus mucosal thickening. The sinus drainage pathways are patent. There is a right concha bullosa. Unremarkable visualized intracranial structures. Atherosclerotic calcification within the proximal intracranial internal carotid arteries and vertebral arteries. Normal appearance of the orbits. IMPRESSION: No evidence of acute or chronic sinusitis. Electronically Signed   By: Deatra Robinson M.D.   On: 01/27/2016 18:16    Lab Data:  CBC:  Recent Labs Lab 01/24/16 1633 01/25/16 0327 01/26/16 0836 01/27/16 0318 01/28/16 0324  WBC 6.4 7.1 12.4* 13.2* 10.8*  NEUTROABS 3.8  --   --   --   --   HGB 9.7* 9.6* 9.6* 8.4* 8.5*  HCT 28.9* 28.1* 27.3* 24.3* 25.1*  MCV 86.8 85.2 83.2 84.1 85.1  PLT 370 387 406* 351 359   Basic Metabolic Panel:  Recent Labs Lab 01/24/16 1633 01/25/16 0327 01/26/16 0836 01/27/16 0318  NA 132* 131* 132* 134*  K 4.9 4.8 4.3 3.6  CL 100* 97* 95* 99*  CO2 21* 22 20* 22  GLUCOSE 95 206* 169* 170*  BUN 44* 49* 69* 72*  CREATININE 1.51* 1.82* 2.15* 1.86*  CALCIUM 10.1 9.7 10.2 9.6   GFR: Estimated Creatinine Clearance: 18.8 mL/min (by C-G formula based on SCr of 1.86 mg/dL (H)). Liver Function Tests:  Recent Labs Lab 01/24/16 1633  AST 39  ALT 26  ALKPHOS 68  BILITOT 0.3  PROT 8.1  ALBUMIN 3.8   No results for input(s): LIPASE, AMYLASE in the last 168 hours. No results for input(s): AMMONIA in the last 168 hours. Coagulation Profile:  Recent Labs Lab 01/27/16 1235 01/28/16 0324  INR 1.20 1.20   Cardiac Enzymes:  Recent Labs Lab 01/24/16 1633 01/26/16 1041 01/26/16 1537 01/26/16 2129  TROPONINI <0.03 <0.03 <0.03 <0.03   BNP (last 3 results) No results for input(s): PROBNP in the last 8760 hours. HbA1C: No results for input(s): HGBA1C in the last 72 hours. CBG:  Recent Labs Lab 01/27/16 2001 01/28/16 0013 01/28/16 0415  01/28/16 0732 01/28/16 1123  GLUCAP 253* 176* 199* 186* 170*   Lipid Profile: No results for input(s): CHOL, HDL, LDLCALC, TRIG, CHOLHDL, LDLDIRECT in the last 72 hours. Thyroid Function Tests:  Recent Labs  01/25/16 1759  TSH 8.794*  FREET4 1.46*   Anemia Panel: No results for input(s): VITAMINB12, FOLATE, FERRITIN, TIBC, IRON, RETICCTPCT in the last 72 hours. Urine analysis:    Component Value Date/Time   COLORURINE YELLOW 11/06/2015 1830   APPEARANCEUR CLEAR 11/06/2015 1830   LABSPEC 1.012 11/06/2015 1830   PHURINE 8.0 11/06/2015 1830   GLUCOSEU NEGATIVE 11/06/2015 1830   HGBUR NEGATIVE 11/06/2015 1830   BILIRUBINUR NEGATIVE 11/06/2015 1830   KETONESUR NEGATIVE 11/06/2015 1830   PROTEINUR NEGATIVE 11/06/2015 1830   UROBILINOGEN 0.2 01/27/2015 2308   NITRITE NEGATIVE 11/06/2015 1830   LEUKOCYTESUR NEGATIVE 11/06/2015 1830     Jozeph Persing M.D. [facility] 01/28/2016, 12:01 PM  Pager: [phone] Between 7am to 7pm - call Pager - [phone]  After 7pm go to [email or web address] - password [email or web address]  Call night coverage person covering after 7pm

## 2016-01-28 NOTE — Progress Notes (Addendum)
ANTICOAGULATION CONSULT NOTE - Follow Up Consult  Pharmacy Consult for heparin and Warfarin Indication: DVT  No Known Allergies  Patient Measurements: Height: 5\' 6"  (167.6 cm) Weight: 139 lb 3.2 oz (63.1 kg) IBW/kg (Calculated) : 59.3 Heparin Dosing Weight: 68 kg  Vital Signs: Temp: 98.1 F (36.7 C) (10/27 0421) Temp Source: Oral (10/27 0421) BP: 153/61 (10/27 0421) Pulse Rate: 81 (10/27 0421)  Labs:  Recent Labs  01/26/16 0836 01/26/16 1041 01/26/16 1537  01/26/16 2129 01/27/16 0318 01/27/16 1235 01/27/16 2001 01/28/16 0324  HGB 9.6*  --   --   --   --  8.4*  --   --  8.5*  HCT 27.3*  --   --   --   --  24.3*  --   --  25.1*  PLT 406*  --   --   --   --  351  --   --  359  APTT 102*  --   --   < >  --  60* 66* 88* 74*  LABPROT  --   --   --   --   --   --  15.2  --  15.3*  INR  --   --   --   --   --   --  1.20  --  1.20  HEPARINUNFRC >2.20*  --   --   < >  --  1.00*  --  0.97* 0.89*  CREATININE 2.15*  --   --   --   --  1.86*  --   --   --   TROPONINI  --  <0.03 <0.03  --  <0.03  --   --   --   --   < > = values in this interval not displayed.  Estimated Creatinine Clearance: 18.8 mL/min (by C-G formula based on SCr of 1.86 mg/dL (H)).   Medications:  Scheduled:  . acetaminophen  650 mg Oral BID  . amiodarone  400 mg Oral BID  . amLODipine  10 mg Oral Daily  . atorvastatin  10 mg Oral q1800  . B-complex with vitamin C  1 tablet Oral Daily  . calcium-vitamin D  1 tablet Oral Q breakfast  . chlorhexidine  15 mL Mouth/Throat BID  . Chlorhexidine Gluconate Cloth  6 each Topical Q0600  . cycloSPORINE  1 drop Both Eyes BID  . feeding supplement  1 Container Oral Daily  . feeding supplement (OSMOLITE 1.5 CAL)  1,000 mL Per Tube Q24H  . fluticasone  2 spray Each Nare Daily  . free water  60 mL Per Tube Q6H  . gabapentin  300 mg Oral TID  . hydrALAZINE  50 mg Oral TID  . insulin aspart  0-15 Units Subcutaneous Q4H  . ipratropium-albuterol  3 mL Nebulization  TID  . isosorbide mononitrate  30 mg Oral Daily  . latanoprost  1 drop Both Eyes QHS  . levothyroxine  137 mcg Oral QAC breakfast  . methylPREDNISolone (SOLU-MEDROL) injection  40 mg Intravenous Daily  . mometasone-formoterol  2 puff Inhalation BID  . mupirocin ointment  1 application Nasal BID  . pantoprazole  40 mg Oral BID  . polyethylene glycol  17 g Oral BID  . polyvinyl alcohol  1 drop Both Eyes QID  . sertraline  100 mg Oral QHS  . sodium chloride flush  3 mL Intravenous Q12H  . sodium chloride flush  3 mL Intravenous Q12H  . warfarin   Does not  apply Once  . Warfarin - Pharmacist Dosing Inpatient   Does not apply q1800   Infusions:  . sodium chloride    . heparin 1,000 Units/hr (01/28/16 0429)    Assessment: 82 yo female with afib now with new DVT (was on apixaban previously) is currently on therapeutic heparin.  Her aPTT is 74 sec and heparin level is 0.89 which probably still has effect from apixaban.  She was started on oral Warfarin yesterday and her INR is 1.2 after 2mg .  Hemoglobin is low but no acute signs or symptoms of bleeding.  This has remained stable the last 48 hours.  Drug/Drug Interactions: She is also on oral Amiodarone which can cause increase in INR and thus a slow approach to up titration is warranted.  Goal of Therapy:  aPTT 66-102 seconds Monitor platelets by anticoagulation protocol: Yes   Plan:  - continue heparin at 1000 units/hr - daily heparin level, aPTT and CBC - Warfarin 2mg  x 1 today  Rober Minion, PharmD., MS Clinical Pharmacist Pager:  [phone] Thank you for allowing pharmacy to be part of this patients care team. 01/28/2016,8:12 AM

## 2016-01-28 NOTE — Progress Notes (Signed)
    Reviewed CXR noting no obvious PNA or edema. Also noted ABG results. Will plan to treat for possible bronchitis with azithromycin for the next 5 days and see if she has improvement.

## 2016-01-28 NOTE — Progress Notes (Signed)
Name: Shannon [name] MRN: [id number] DOB: 07-Feb-1924    ADMISSION DATE:  01/24/2016 CONSULTATION DATE:  01/24/16  REFERRING MD :  Dr. Delton See   CHIEF COMPLAINT:  Respiratory Failure    HISTORY OF PRESENT ILLNESS:  82 y/o F, never smoker (chewed tobacco) with PMH of anemia, arthritis, depression, GERD, gastric stenosis, PUD, ventral hernia s/p repair, colectomy,  hypothyroidism, DM II, CAD, HTN, HLD, PAF (on amiodarone / Eliquis), bradycardia, CVA, & asthma / COPD who presented to [facility] on 10/23 with reports of shortness of breath.    The patient was admitted to the hospital in August for bradycardia & weakness.  She was taken off her labetalol and amiodarone was reduced during that admit.    The patient reports she felt as though her asthma had been "acting up" with worsening shortness of breath despite increased inhaler use.  She reported variable heart rate - HR in the 50's then would increase to 110's.  She also had noticed progressive swelling in her lower extremities.  She was seen in the Cardiology office on 10/23 and found to be in rapid atrial flutter with O2 sats of 87%.  She was directly admitted from the Cardiology office for rate control.  The patient was started on IV amiodarone.  She initially improved but afternoon 10/24, she developed increased SOB / work of breathing and was placed on bipap.  ABG assessment demonstrated ph 7.622, undetectable CO2, and bicarbonate of 15.3.    SUBJECTIVE: Degree of dyspnea improving steadily per patient  She is having trouble swallowing pills with thick liquids Patient denies any chest pain or pressure. She does report intermittent coughing productive of a cloudy mucus but no hemoptysis.  REVIEW OF SYSTEMS: No subjective fever or chills. Patient is having maxillary sinus pain as well as pain in her left upper jaw and left ear, but CT sinus indicates no simusitis. Reports increasing sinus congestion. Denies any other headache or vision changes.  VITAL  SIGNS: Temp:  [97.6 F (36.4 C)-98.1 F (36.7 C)] 98.1 F (36.7 C) (10/27 0421) Pulse Rate:  [79-86] 81 (10/27 0421) Resp:  [18-28] 18 (10/27 0421) BP: (139-154)/(56-105) 139/59 (10/27 0817) SpO2:  [97 %-100 %] 100 % (10/27 0421) Weight:  [139 lb 3.2 oz (63.1 kg)] 139 lb 3.2 oz (63.1 kg) (10/27 0421)  PHYSICAL EXAMINATION: Physical Exam:  General- No distress,  A&Ox3, pleasant, frail ENT: + sinus tenderness, TM clear, pale nasal mucosa, no oral exudate,no post nasal drip, no LAN Cardiac: S1, S2, regular rate and rhythm, no murmur Chest: No wheeze/ rales/ dullness; no accessory muscle use, no nasal flaring, no sternal retractions Abd.: Soft Non-tender Ext: No clubbing cyanosis, edema Neuro: Alert oriented x 4, MAE x 4, deconditioned at baseline Skin: No rashes, warm and dry Psych: normal mood and behavior  Gen.peeral:  Awake. Alert. Daughter at bedside. Int.pesgegument:  Warm & dry. No rash on exposed skin.  HEENT:  Moist mucus membranes. Mostly edentulous with some upper front teeth. No scleral icterus. Mild tenderness to palpation of left maxillary sinus. Cardiovascular:  Regular rate and rhythm. No edemPulmonary:  Clear bilaterally to auscultation. Normal work of breathing on nasal cannula oxygen. Abdomen: Soft. Normal bowel sounds. Noontender. Musculoskeletal:  Normal bulk and tone. No effusion appreciated.   Recent Labs Lab 01/25/16 0327 01/26/16 0836 01/27/16 0318  NA 131* 132* 134*  K 4.8 4.3 3.6  CL 97* 95* 99*  CO2 22 20* 22  BUN 49* 69* 72*  CREATININE 1.82* 2.15* 1.86*  GLUCOSE 206* 169* 170*    Recent Labs Lab 01/26/16 0836 01/27/16 0318 01/28/16 0324  HGB 9.6* 8.4* 8.5*  HCT 27.3* 24.3* 25.1*  WBC 12.4* 13.2* 10.8*  PLT 406* 351 359   Ir Mech Remov Obstruc Mat Any Colon Tube W/fluoro  Result Date: 01/27/2016 INDICATION: The jejunal portion of the GJ tube is occluded. EXAM: MECH REMOVAL GI TUBE OBSTRUCTION MEDICATIONS: None ANESTHESIA/SEDATION:  None CONTRAST:  None FLUOROSCOPY TIME:  None COMPLICATIONS: None immediate. PROCEDURE: The jejunal tube was occluded. Adaptor piece was placed at the end of the jejunal tube and this tube was flushed with a 10 mL syringe by the IR technologist. The tube eventually cleared using this technique. The jejunal tube was flushed with saline. The gastric tube was also flushed to ensure patency. IMPRESSION: The jejunal portion of the GJ tube was successfully unclogged. Electronically Signed   By: Richarda Overlie M.D.   On: 01/27/2016 15:16   Ct Maxillofacial Ltd Wo Cm  Result Date: 01/27/2016 CLINICAL DATA:  Pain behind left eye and left year. EXAM: CT PARANASAL SINUS LIMITED WITHOUT CONTRAST TECHNIQUE: Non-contiguous multidetector CT images of the paranasal sinuses were obtained in a single plane without contrast. COMPARISON:  Head CT 11/06/2015 FINDINGS: There is no paranasal sinus mucosal thickening. The sinus drainage pathways are patent. There is a right concha bullosa. Unremarkable visualized intracranial structures. Atherosclerotic calcification within the proximal intracranial internal carotid arteries and vertebral arteries. Normal appearance of the orbits. IMPRESSION: No evidence of acute or chronic sinusitis. Electronically Signed   By: Deatra Robinson M.D.   On: 01/27/2016 18:16    SIGNIFICANT EVENTS  10/23  Admit with Atrial flutter  STUDIES:  BLE venous dopplers 10/24: There is evidence of deep vein thrombosis involving the left lower extremity. Deep vein thrombosis involves the popliteal vein, peroneal, and posterior tibial veins of the left lower extremity.  01/27/2016 TTE: >> Normal LV systolic function; grade 1 diastolic dysfunction; mild   AI; mild MR; moderate LAE. 01/27/2016 CT Sinus:No evidence of acute or chronic sinusitis.   ASSESSMENT / PLAN:  Left Lower Extremity DVT: No RV strain per echo/ Troponin < 0.03 P: - Continue hepatin gtt while transitioning to Coumadin.  - First dose  Coumadin 10/26. Transition management/ teaching  per pharmacy - Will need careful Coumadin management while on Amiodarone  - Pt. States she would prefer Xaralto, but renal function will not allow at present  Respiratory Failure- Multifactorial Acute on chronic dHF, A-flutter, +/- P P:  High flow nasal cannula as needed to keep sats >92%             - intermittent f/u CXR              - diuresis per cards              - rate control - now NSR              - anticoagulation per cards              - f/u ABG              - monitor closely on amiodarone   Respiratory Alkalosis P: -Suspect mild chronic respiratory alkalosis at baseline - Avoid non-invasive positive pressure ventilation  Respiratory Alkalosiseft lower extremity acute DVT: Continuing hole for CT angiogram of Bevelyn Ngo, AGACNP-BC [facility] Pulmonary/Critical Care Medicine Pager # [phone] 10/27/2017e chest at this time. Defer on further workup for occult malignancy and other causes to  primary service. 1. Acute Hypoxic Respiratory Failure: Improving. Recommend weaning for saturation greater than 92%. Defer to cardiology on further diuresis. Suspect possible underlying pulmonary embolism. Transthoracic echocardiogram pendinRespiratory Alkalosis: Appears to have some mild chronic respiratory alkalosis at baseline. Likely exacerbated by over ventilation from noninvasive positive pressure ventilaRecommend avoiding noninvasive positive pressure ventilation at emainder of care as per cardiology and primary service.

## 2016-01-28 NOTE — Evaluation (Signed)
Physical Therapy Evaluation Patient Details Name: Shannon [name] MRN: [id number] DOB: 04-27-1923 Today's Date: 01/28/2016   History of Present Illness  Shannon [name] is a 82 y.o. female with a history A-fib, hypothyroidism,bradycardia, non-obstructive coronary artery disease, DM, hypertension, cerebro-vascular disease and COPD/ashma. Pt admitted with Patient comes in today extremely short of breath and found to have Atrial flutter, acute on chronic DHF, acute exacerbation of asthma, and essential HTN  Clinical Impression  Patient presents with decreased mobility due to deficits listed in PT problem list to include weakness and decreased endurance.  Feel she will benefit from skilled PT in the acute setting to allow return home with family support and to resume HHPT.     Follow Up Recommendations Home health PT;Supervision/Assistance - 24 hour    Equipment Recommendations  None recommended by PT    Recommendations for Other Services       Precautions / Restrictions Precautions Precautions: Fall Precaution Comments: GJ tube, currently requiring O2 (but not on it pta) Restrictions Weight Bearing Restrictions: No      Mobility  Bed Mobility Overal bed mobility: Needs Assistance Bed Mobility: Supine to Sit     Supine to sit: Min assist;HOB elevated     General bed mobility comments: assist for lines and safety  Transfers Overall transfer level: Needs assistance Equipment used: Rolling walker (2 wheeled) Transfers: Sit to/from Stand Sit to Stand: Min assist         General transfer comment: for balance  Ambulation/Gait Ambulation/Gait assistance: Min assist Ambulation Distance (Feet): 80 Feet Assistive device: Rolling walker (2 wheeled) Gait Pattern/deviations: Step-through pattern;Decreased stride length;Trunk flexed     General Gait Details: little jerky at times and increased time turning with the walker, assist for balance and due to weakness  Stairs            Wheelchair Mobility    Modified Rankin (Stroke Patients Only)       Balance Overall balance assessment: Needs assistance Sitting-balance support: No upper extremity supported;Feet supported Sitting balance-Leahy Scale: Good     Standing balance support: Bilateral upper extremity supported Standing balance-Leahy Scale: Poor Standing balance comment: UE support needed for balance                             Pertinent Vitals/Pain Pain Assessment: 0-10 Pain Score: 5  Pain Location: shoulder, neck and nose and eye Pain Descriptors / Indicators: Burning;Sore Pain Intervention(s): Monitored during session;Limited activity within patient's tolerance    Home Living Family/patient expects to be discharged to:: Private residence Living Arrangements: Children Available Help at Discharge: Family;Available 24 hours/day Type of Home: House Home Access: Ramped entrance     Home Layout: One level Home Equipment: Walker - 4 wheels;Cane - single point;Shower seat;Hand held Careers information officer - 2 wheels      Prior Function Level of Independence: Needs assistance   Gait / Transfers Assistance Needed: Uses rollator   ADL's / Homemaking Assistance Needed: family assists with tub tranfers and basic ADLs prn        Hand Dominance   Dominant Hand: Right    Extremity/Trunk Assessment   Upper Extremity Assessment: Defer to OT evaluation           Lower Extremity Assessment: Generalized weakness         Communication   Communication: No difficulties  Cognition Arousal/Alertness: Awake/alert Behavior During Therapy: WFL for tasks assessed/performed Overall Cognitive Status: Within Functional Limits for tasks assessed  General Comments      Exercises     Assessment/Plan    PT Assessment Patient needs continued PT services  PT Problem List Decreased strength;Decreased activity tolerance;Decreased knowledge of use of  DME;Decreased mobility;Cardiopulmonary status limiting activity          PT Treatment Interventions DME instruction;Gait training;Therapeutic activities;Therapeutic exercise;Balance training;Functional mobility training;Stair training    PT Goals (Current goals can be found in the Care Plan section)  Acute Rehab PT Goals Patient Stated Goal: to go home PT Goal Formulation: With patient Time For Goal Achievement: 02/11/16 Potential to Achieve Goals: Good    Frequency Min 3X/week   Barriers to discharge        Co-evaluation               End of Session Equipment Utilized During Treatment: Gait belt;Oxygen Activity Tolerance: Patient limited by fatigue Patient left: in chair;with call bell/phone within reach Nurse Communication: Mobility status         Time: 0865-7846 PT Time Calculation (min) (ACUTE ONLY): 32 min   Charges:   PT Evaluation $PT Eval Moderate Complexity: 1 Procedure PT Treatments $Gait Training: 8-22 mins   PT G CodesElray Mcgregor 02-19-16, 4:52 PM  [name], PT [phone] 2016-02-19

## 2016-01-29 LAB — GLUCOSE, CAPILLARY
Glucose-Capillary: 157 mg/dL — ABNORMAL HIGH (ref 65–99)
Glucose-Capillary: 124 mg/dL — ABNORMAL HIGH (ref 65–99)
Glucose-Capillary: 151 mg/dL — ABNORMAL HIGH (ref 65–99)
Glucose-Capillary: 226 mg/dL — ABNORMAL HIGH (ref 65–99)
Glucose-Capillary: 256 mg/dL — ABNORMAL HIGH (ref 65–99)

## 2016-01-29 LAB — PROTIME-INR
INR: 1.11
Prothrombin Time: 14.4 seconds (ref 11.4–15.2)

## 2016-01-29 LAB — CBC
HCT: 26.1 % — ABNORMAL LOW (ref 36.0–46.0)
Hemoglobin: 8.7 g/dL — ABNORMAL LOW (ref 12.0–15.0)
MCH: 28.6 pg (ref 26.0–34.0)
MCHC: 33.3 g/dL (ref 30.0–36.0)
MCV: 85.9 fL (ref 78.0–100.0)
Platelets: 337 10*3/uL (ref 150–400)
RBC: 3.04 MIL/uL — ABNORMAL LOW (ref 3.87–5.11)
RDW: 16.5 % — ABNORMAL HIGH (ref 11.5–15.5)
WBC: 11 10*3/uL — ABNORMAL HIGH (ref 4.0–10.5)

## 2016-01-29 LAB — HEPARIN LEVEL (UNFRACTIONATED): Heparin Unfractionated: 0.74 IU/mL — ABNORMAL HIGH (ref 0.30–0.70)

## 2016-01-29 MED ORDER — WARFARIN SODIUM 5 MG PO TABS
5.0000 mg | ORAL_TABLET | Freq: Once | ORAL | Status: AC
  Administered 2016-01-29: 5 mg via ORAL
  Filled 2016-01-29: qty 1

## 2016-01-29 NOTE — Progress Notes (Signed)
Subjective:  Complains of some vague neck discomfort this morning.  Breathing is better at the present time.  His maintain sinus rhythm overnight.  Chest x-ray did not show pneumonia.  Objective:  Vital Signs in the last 24 hours: BP 140/71 (BP Location: Right Arm)   Pulse 75   Temp 97.3 F (36.3 C) (Oral)   Resp 14   Ht 5\' 6"  (1.676 m)   Wt 62.2 kg (137 lb 3.2 oz)   SpO2 100%   BMI 22.14 kg/m   Physical Exam: Elderly black female in no acute distress Lungs:  Clear Cardiac:  Regular rhythm, normal S1 and S2, no S3 Abdomen:  Soft, nontender, no masses Extremities:  No edema present  Intake/Output from previous day: 10/27 0701 - 10/28 0700 In: 60  Out: 2200 [Urine:2200]  Weight Filed Weights   01/27/16 0500 01/28/16 0421 01/29/16 0417  Weight: 64 kg (141 lb) 63.1 kg (139 lb 3.2 oz) 62.2 kg (137 lb 3.2 oz)    Lab Results: Basic Metabolic Panel:  Recent Labs  01/27/16 0318  NA 134*  K 3.6  CL 99*  CO2 22  GLUCOSE 170*  BUN 72*  CREATININE 1.86*   CBC:  Recent Labs  01/28/16 0324 01/29/16 0549  WBC 10.8* 11.0*  HGB 8.5* 8.7*  HCT 25.1* 26.1*  MCV 85.1 85.9  PLT 359 337   Cardiac Panel (last 3 results)  Recent Labs  01/26/16 1041 01/26/16 1537 01/26/16 2129  TROPONINI <0.03 <0.03 <0.03    Telemetry: Maintaining sinus rhythm  Assessment/Plan:  1.  Atrial flutter has resolved and currently on by mouth amiodarone. 2.  Anticoagulation with Coumadin now with heparin bridge 3.  Previous DVT on Eliquis 4.  Acute on chronic kidney disease creatinine abated yesterday but none today.  Recommendations:  Continue amiodarone.  She is not short of breath and may convert back to oral therapy.  Echo yesterday was normal.  She is being converted to warfarin.     Kerry Hough  MD [facility] Cardiology  01/29/2016, 10:01 AM

## 2016-01-29 NOTE — Progress Notes (Addendum)
ANTICOAGULATION CONSULT NOTE - Follow Up Consult  Pharmacy Consult for heparin and warfarin Indication: DVT, h/o a-fib  No Known Allergies  Patient Measurements: Height: 5\' 6"  (167.6 cm) Weight: 137 lb 3.2 oz (62.2 kg) IBW/kg (Calculated) : 59.3  Vital Signs: Temp: 97.3 F (36.3 C) (10/28 0903) Temp Source: Oral (10/28 0903) BP: 112/52 (10/28 1036) Pulse Rate: 75 (10/28 0903)  Labs:  Recent Labs  01/26/16 1537  01/26/16 2129  01/27/16 0318 01/27/16 1235 01/27/16 2001 01/28/16 0324 01/29/16 0549  HGB  --   --   --   < > 8.4*  --   --  8.5* 8.7*  HCT  --   --   --   --  24.3*  --   --  25.1* 26.1*  PLT  --   --   --   --  351  --   --  359 337  APTT  --   < >  --   --  60* 66* 88* 74*  --   LABPROT  --   --   --   --   --  15.2  --  15.3* 14.4  INR  --   --   --   --   --  1.20  --  1.20 1.11  HEPARINUNFRC  --   < >  --   --  1.00*  --  0.97* 0.89* 0.74*  CREATININE  --   --   --   --  1.86*  --   --   --   --   TROPONINI <0.03  --  <0.03  --   --   --   --   --   --   < > = values in this interval not displayed.  Estimated Creatinine Clearance: 18.8 mL/min (by C-G formula based on SCr of 1.86 mg/dL (H)).  Assessment: 82 yo F with h/o afib now with new DVT while on apixaban (last dose 10/24 at ~0930). Apixaban discontinued and pharmacy consulted to start warfarin with heparin bridge. Heparin level trending down to 0.74 today which is still slightly supratherapeutic. INR subtherapeutic at 1.11 after two days of 2mg . Hgb low stable, Plt wnl, no bleeding documented.  Drug/Drug Interactions: Dosing warfarin cautiously d/t DDIs with amiodarone and azithromycin.  Goal of Therapy:  Heparin level 0.3-0.7 units/ml INR 2-3 Monitor platelets by anticoagulation protocol: Yes   Plan:  - Decrease heparin to 950 units/hr. Continue until INR > 2 - Daily heparin level, INR, CBC - Warfarin 5mg  x 1 today - Monitor s/sx bleeding   Gwenlyn Perking, PharmD PGY1 Pharmacy  Resident Pager: [phone] 01/29/2016 10:59 AM

## 2016-01-29 NOTE — Discharge Instructions (Signed)

## 2016-01-30 LAB — BASIC METABOLIC PANEL
Anion gap: 9 (ref 5–15)
BUN: 43 mg/dL — ABNORMAL HIGH (ref 6–20)
CO2: 21 mmol/L — ABNORMAL LOW (ref 22–32)
Calcium: 9.8 mg/dL (ref 8.9–10.3)
Chloride: 102 mmol/L (ref 101–111)
Creatinine, Ser: 1.3 mg/dL — ABNORMAL HIGH (ref 0.44–1.00)
GFR calc Af Amer: 41 mL/min — ABNORMAL LOW (ref >60)
GFR calc non Af Amer: 35 mL/min — ABNORMAL LOW (ref >60)
Glucose, Bld: 187 mg/dL — ABNORMAL HIGH (ref 65–99)
Potassium: 4.4 mmol/L (ref 3.5–5.1)
Sodium: 132 mmol/L — ABNORMAL LOW (ref 135–145)

## 2016-01-30 LAB — GLUCOSE, CAPILLARY
Glucose-Capillary: 142 mg/dL — ABNORMAL HIGH (ref 65–99)
Glucose-Capillary: 189 mg/dL — ABNORMAL HIGH (ref 65–99)
Glucose-Capillary: 207 mg/dL — ABNORMAL HIGH (ref 65–99)
Glucose-Capillary: 229 mg/dL — ABNORMAL HIGH (ref 65–99)
Glucose-Capillary: 281 mg/dL — ABNORMAL HIGH (ref 65–99)

## 2016-01-30 LAB — CBC
HCT: 26.5 % — ABNORMAL LOW (ref 36.0–46.0)
Hemoglobin: 8.6 g/dL — ABNORMAL LOW (ref 12.0–15.0)
MCH: 28.3 pg (ref 26.0–34.0)
MCHC: 32.5 g/dL (ref 30.0–36.0)
MCV: 87.2 fL (ref 78.0–100.0)
Platelets: 326 10*3/uL (ref 150–400)
RBC: 3.04 MIL/uL — ABNORMAL LOW (ref 3.87–5.11)
RDW: 16.9 % — ABNORMAL HIGH (ref 11.5–15.5)
WBC: 13.9 10*3/uL — ABNORMAL HIGH (ref 4.0–10.5)

## 2016-01-30 LAB — PROTIME-INR
INR: 1.08
Prothrombin Time: 14.1 seconds (ref 11.4–15.2)

## 2016-01-30 LAB — HEPARIN LEVEL (UNFRACTIONATED): Heparin Unfractionated: 0.69 IU/mL (ref 0.30–0.70)

## 2016-01-30 MED ORDER — WARFARIN SODIUM 7.5 MG PO TABS
7.5000 mg | ORAL_TABLET | Freq: Once | ORAL | Status: AC
  Administered 2016-01-30: 7.5 mg via ORAL
  Filled 2016-01-30: qty 1

## 2016-01-30 NOTE — Progress Notes (Signed)
ANTICOAGULATION CONSULT NOTE - Follow Up Consult  Pharmacy Consult for heparin and warfarin Indication: DVT, h/o a-fib  No Known Allergies  Patient Measurements: Height: 5\' 6"  (167.6 cm) Weight: 137 lb 8 oz (62.4 kg) IBW/kg (Calculated) : 59.3  Vital Signs: Temp: 97.6 F (36.4 C) (10/29 0417) Temp Source: Oral (10/29 0417) BP: 140/59 (10/29 0417) Pulse Rate: 71 (10/29 0417)  Labs:  Recent Labs  01/27/16 1235  01/27/16 2001  01/28/16 0324 01/29/16 0549 01/30/16 0410  HGB  --   --   --   < > 8.5* 8.7* 8.6*  HCT  --   --   --   --  25.1* 26.1* 26.5*  PLT  --   --   --   --  359 337 326  APTT 66*  --  88*  --  74*  --   --   LABPROT 15.2  --   --   --  15.3* 14.4 14.1  INR 1.20  --   --   --  1.20 1.11 1.08  HEPARINUNFRC  --   < > 0.97*  --  0.89* 0.74* 0.69  < > = values in this interval not displayed.  Estimated Creatinine Clearance: 18.8 mL/min (by C-G formula based on SCr of 1.86 mg/dL (H)).  Assessment: 82 yo F with h/o a-fib, now with new DVT while on apixaban (last dose 10/24 at ~0930). Apixaban discontinued and pharmacy consulted to start warfarin with heparin bridge. HL therapeutic today at 0.69. INR remains subtherapeutic and trending down to 1.08. Hgb low stable, Plt wnl, no bleeding documented.  Drug/Drug Interactions: Dosing warfarin cautiously d/t DDIs with amiodarone and azithromycin.  Goal of Therapy:  Heparin level 0.3-0.7 units/ml INR 2-3 Monitor platelets by anticoagulation protocol: Yes   Plan:  - Continue heparin at 950 units/hr. D/c once INR > 2 - Daily heparin level, INR, CBC - Warfarin 7.5mg  x 1 tonight - Monitor s/sx bleeding - Education with patient and daughter completed   Gwenlyn Perking, PharmD PGY1 Pharmacy Resident Pager: [phone] 01/30/2016 7:23 AM

## 2016-01-30 NOTE — Progress Notes (Signed)
Subjective:  Maintaining normal sinus rhythm overnight.  Occasional PACs noted.  Complains of mild shortness of breath.  Awaiting conversion to oral anticoagulation with Coumadin continues on IV heparin.  Objective:  Vital Signs in the last 24 hours: BP (!) 132/56 (BP Location: Right Arm)   Pulse 72   Temp 97.5 F (36.4 C) (Oral)   Resp (!) 22   Ht 5\' 6"  (1.676 m)   Wt 62.4 kg (137 lb 8 oz)   SpO2 100%   BMI 22.19 kg/m   Physical Exam: Elderly black female in no acute distress Lungs:  Clear Cardiac:  Regular rhythm, normal S1 and S2, no S3 Abdomen:  Soft, nontender, no masses Extremities:  No edema present  Intake/Output from previous day: 10/28 0701 - 10/29 0700 In: -  Out: 1250 [Urine:1250]  Weight Filed Weights   01/28/16 0421 01/29/16 0417 01/30/16 0417  Weight: 63.1 kg (139 lb 3.2 oz) 62.2 kg (137 lb 3.2 oz) 62.4 kg (137 lb 8 oz)    Lab Results: CBC:  Recent Labs  01/29/16 0549 01/30/16 0410  WBC 11.0* 13.9*  HGB 8.7* 8.6*  HCT 26.1* 26.5*  MCV 85.9 87.2  PLT 337 326   Lab Results  Component Value Date   INR 1.08 01/30/2016   INR 1.11 01/29/2016   INR 1.20 01/28/2016     Telemetry: Sinus rhythm with occasional PACs.  Assessment/Plan:  1.  Atrial flutter has resolved and currently on by mouth amiodarone. 2.  Anticoagulation with Coumadin now with heparin bridge 3.  Previous DVT on Eliquis 4.  Acute on chronic kidney disease creatinine abated yesterday but none today.  Recommendations:  Continue amiodarone loading.  Awaiting warfarin load so we can stop heparin.     Kerry Hough  MD [facility] Card[facility]  01/30/2016, 10:23 AM

## 2016-01-31 LAB — BLOOD GAS, ARTERIAL
Acid-Base Excess: 0.4 mmol/L (ref 0.0–2.0)
Bicarbonate: 22.6 mmol/L (ref 20.0–28.0)
O2 Content: 2 L/min
O2 Saturation: 98 %
Patient temperature: 98.6
pCO2 arterial: 25.2 mmHg — ABNORMAL LOW (ref 32.0–48.0)
pH, Arterial: 7.561 — ABNORMAL HIGH (ref 7.350–7.450)
pO2, Arterial: 95.6 mmHg (ref 83.0–108.0)

## 2016-01-31 LAB — BASIC METABOLIC PANEL
Anion gap: 10 (ref 5–15)
BUN: 47 mg/dL — ABNORMAL HIGH (ref 6–20)
CO2: 22 mmol/L (ref 22–32)
Calcium: 9.7 mg/dL (ref 8.9–10.3)
Chloride: 102 mmol/L (ref 101–111)
Creatinine, Ser: 1.34 mg/dL — ABNORMAL HIGH (ref 0.44–1.00)
GFR calc Af Amer: 39 mL/min — ABNORMAL LOW (ref >60)
GFR calc non Af Amer: 34 mL/min — ABNORMAL LOW (ref >60)
Glucose, Bld: 162 mg/dL — ABNORMAL HIGH (ref 65–99)
Potassium: 4.3 mmol/L (ref 3.5–5.1)
Sodium: 134 mmol/L — ABNORMAL LOW (ref 135–145)

## 2016-01-31 LAB — GLUCOSE, CAPILLARY
Glucose-Capillary: 141 mg/dL — ABNORMAL HIGH (ref 65–99)
Glucose-Capillary: 150 mg/dL — ABNORMAL HIGH (ref 65–99)
Glucose-Capillary: 175 mg/dL — ABNORMAL HIGH (ref 65–99)
Glucose-Capillary: 180 mg/dL — ABNORMAL HIGH (ref 65–99)
Glucose-Capillary: 226 mg/dL — ABNORMAL HIGH (ref 65–99)
Glucose-Capillary: 255 mg/dL — ABNORMAL HIGH (ref 65–99)
Glucose-Capillary: 91 mg/dL (ref 65–99)

## 2016-01-31 LAB — CBC
HCT: 27.1 % — ABNORMAL LOW (ref 36.0–46.0)
Hemoglobin: 9.1 g/dL — ABNORMAL LOW (ref 12.0–15.0)
MCH: 29.2 pg (ref 26.0–34.0)
MCHC: 33.6 g/dL (ref 30.0–36.0)
MCV: 86.9 fL (ref 78.0–100.0)
Platelets: 342 10*3/uL (ref 150–400)
RBC: 3.12 MIL/uL — ABNORMAL LOW (ref 3.87–5.11)
RDW: 16.8 % — ABNORMAL HIGH (ref 11.5–15.5)
WBC: 15.5 10*3/uL — ABNORMAL HIGH (ref 4.0–10.5)

## 2016-01-31 LAB — PROTIME-INR
INR: 1.31
Prothrombin Time: 16.4 seconds — ABNORMAL HIGH (ref 11.4–15.2)

## 2016-01-31 LAB — HEPARIN LEVEL (UNFRACTIONATED): Heparin Unfractionated: 0.75 IU/mL — ABNORMAL HIGH (ref 0.30–0.70)

## 2016-01-31 MED ORDER — AMIODARONE HCL 200 MG PO TABS
200.0000 mg | ORAL_TABLET | Freq: Two times a day (BID) | ORAL | Status: DC
  Administered 2016-01-31 – 2016-02-02 (×4): 200 mg via ORAL
  Filled 2016-01-31 (×4): qty 1

## 2016-01-31 MED ORDER — WARFARIN SODIUM 5 MG PO TABS
5.0000 mg | ORAL_TABLET | Freq: Once | ORAL | Status: AC
  Administered 2016-01-31: 5 mg via ORAL
  Filled 2016-01-31: qty 1

## 2016-01-31 NOTE — Progress Notes (Signed)
Physical Therapy Treatment Patient Details Name: Remya Hernandezgarci MRN: [id number] DOB: 07/12/1923 Today's Date: 01/31/2016    History of Present Illness Jahnyah Hawken is a 82 y.o. female with a history A-fib, hypothyroidism,bradycardia, non-obstructive coronary artery disease, DM, hypertension, cerebro-vascular disease and COPD/ashma. Pt admitted with Patient comes in today extremely short of breath and found to have Atrial flutter, acute on chronic DHF, acute exacerbation of asthma, and essential HTN    PT Comments    Patient progressing with ambulation this session, able to make two laps around nurses station with one seated rest with rollator.  Feel she will progress to be able to go home with HHPT.  Will continue skilled PT towards goals.   Follow Up Recommendations  Home health PT;Supervision/Assistance - 24 hour     Equipment Recommendations  None recommended by PT    Recommendations for Other Services       Precautions / Restrictions Precautions Precautions: Fall Precaution Comments: GJ tube Restrictions Weight Bearing Restrictions: No    Mobility  Bed Mobility Overal bed mobility: Needs Assistance Bed Mobility: Supine to Sit     Supine to sit: Min guard;HOB elevated     General bed mobility comments: assist for lines, guiding legs  Transfers Overall transfer level: Needs assistance Equipment used: 4-wheeled walker Transfers: Sit to/from [employer] Sit to Stand: Min guard Stand pivot transfers: Min assist       General transfer comment: up to [facility] initially, assist for balance and to rise from bed, assist with rollator up from EOB to walk with assist for lines and steadying.   Ambulation/Gait Ambulation/Gait assistance: Min guard;Supervision Ambulation Distance (Feet): 200 Feet (x 2 with seated rest) Assistive device: 4-wheeled walker Gait Pattern/deviations: Step-through pattern;Decreased stride length     General Gait Details: managed  rollator without assist, no LOB and able to walk on RA with SpO2 96 or above   Stairs            Wheelchair Mobility    Modified Rankin (Stroke Patients Only)       Balance Overall balance assessment: Needs assistance   Sitting balance-Leahy Scale: Good     Standing balance support: Bilateral upper extremity supported Standing balance-Leahy Scale: Poor Standing balance comment: needs UE support for balance                     Cognition Arousal/Alertness: Awake/alert Behavior During Therapy: WFL for tasks assessed/performed Overall Cognitive Status: Within Functional Limits for tasks assessed                      Exercises      General Comments        Pertinent Vitals/Pain Faces Pain Scale: No hurt    Home Living                      Prior Function            PT Goals (current goals can now be found in the care plan section) Progress towards PT goals: Progressing toward goals    Frequency    Min 3X/week      PT Plan Current plan remains appropriate    Co-evaluation             End of Session Equipment Utilized During Treatment: Gait belt Activity Tolerance: Patient tolerated treatment well Patient left: in chair;with call bell/phone within reach;with chair alarm set     Time: 1023-1050 PT  Time Calculation (min) (ACUTE ONLY): 27 min  Charges:  $Gait Training: 23-37 mins                    G Codes:      Elray Mcgregor February 10, 2016, 11:59 AM  [name], PT [phone] 02/10/16

## 2016-01-31 NOTE — Progress Notes (Signed)
Subjective:  She is up in a chair, she says her breathing is "OK" but she takes deeps gasps when speaking.  Objective:  Vital Signs in the last 24 hours: Temp:  [97.3 F (36.3 C)-98.3 F (36.8 C)] 98.3 F (36.8 C) (10/30 0500) Pulse Rate:  [69-78] 78 (10/30 0500) Resp:  [15-22] 16 (10/30 0500) BP: (145-176)/(65-76) 158/76 (10/30 0500) SpO2:  [100 %] 100 % (10/30 0807) Weight:  [136 lb 6.4 oz (61.9 kg)] 136 lb 6.4 oz (61.9 kg) (10/30 0500)  Intake/Output from previous day:  Intake/Output Summary (Last 24 hours) at 01/31/16 1058 Last data filed at 01/31/16 0600  Gross per 24 hour  Intake          5798.62 ml  Output             1175 ml  Net          4623.62 ml    Physical Exam: General appearance: alert, cooperative and no distress Neck: no JVD Lungs: clear to auscultation bilaterally Heart: regular rate and rhythm Extremities: no edema Skin: Skin color, texture, turgor normal. No rashes or lesions Neurologic: Grossly normal   Rate: 78  Rhythm: normal sinus rhythm  Lab Results:  Recent Labs  01/30/16 0410 01/31/16 0418  WBC 13.9* 15.5*  HGB 8.6* 9.1*  PLT 326 342    Recent Labs  01/30/16 1137 01/31/16 0418  NA 132* 134*  K 4.4 4.3  CL 102 102  CO2 21* 22  GLUCOSE 187* 162*  BUN 43* 47*  CREATININE 1.30* 1.34*   No results for input(s): TROPONINI in the last 72 hours.  Invalid input(s): CK, MB  Recent Labs  01/31/16 0418  INR 1.31    Scheduled Meds: . acetaminophen  650 mg Oral BID  . amiodarone  400 mg Oral BID  . amLODipine  10 mg Oral Daily  . atorvastatin  10 mg Oral q1800  . azithromycin  250 mg Oral Daily  . B-complex with vitamin C  1 tablet Oral Daily  . calcium-vitamin D  1 tablet Oral Q breakfast  . chlorhexidine  15 mL Mouth/Throat BID  . cycloSPORINE  1 drop Both Eyes BID  . erythromycin   Both Eyes Q8H  . feeding supplement  1 Container Oral Daily  . feeding supplement (OSMOLITE 1.5 CAL)  1,000 mL Per Tube Q24H  .  fluticasone  1 spray Each Nare BID  . free water  60 mL Per Tube Q6H  . gabapentin  300 mg Oral TID  . hydrALAZINE  50 mg Oral TID  . insulin aspart  0-15 Units Subcutaneous Q4H  . ipratropium-albuterol  3 mL Nebulization TID  . isosorbide mononitrate  30 mg Oral Daily  . latanoprost  1 drop Both Eyes QHS  . levothyroxine  137 mcg Oral QAC breakfast  . methylPREDNISolone (SOLU-MEDROL) injection  40 mg Intravenous Daily  . mometasone-formoterol  2 puff Inhalation BID  . mupirocin ointment  1 application Nasal BID  . pantoprazole  40 mg Oral BID  . polyethylene glycol  17 g Oral BID  . polyvinyl alcohol  1 drop Both Eyes QID  . sertraline  100 mg Oral QHS  . sodium chloride flush  3 mL Intravenous Q12H  . sodium chloride flush  3 mL Intravenous Q12H  . warfarin  5 mg Oral ONCE-1800  . warfarin   Does not apply Once  . Warfarin - Pharmacist Dosing Inpatient   Does not apply [phone]  Continuous Infusions: . sodium chloride    . heparin 950 Units/hr (01/30/16 0621)   PRN Meds:.sodium chloride, acetaminophen, benzonatate, bisacodyl, levalbuterol, ondansetron (ZOFRAN) IV, ondansetron, RESOURCE THICKENUP CLEAR, simethicone, sodium chloride flush, sodium chloride flush, traMADol   Imaging: Imaging results have been reviewed  Cardiac Studies: Echo 01/27/16 Study Conclusions  - Left ventricle: The cavity size was normal. Wall thickness was   normal. Systolic function was normal. The estimated ejection   fraction was in the range of 55% to 60%. Wall motion was normal;   there were no regional wall motion abnormalities. Doppler   parameters are consistent with abnormal left ventricular   relaxation (grade 1 diastolic dysfunction). - Aortic valve: There was mild regurgitation. - Mitral valve: There was mild regurgitation. - Left atrium: The atrium was moderately dilated.  Impressions:  - Normal LV systolic function; grade 1 diastolic dysfunction; mild   AI; mild MR; moderate  LAE.    Assessment/Plan:  82 y.o. female with a history of PAF on amiodarone and Eliquis as well as a history of hypothyroidism,bradycardia, non-obstructive CAD with normal LVF, type 2 diabetes, hypertension, cerebro-vascular disease and COPD/ashtma. She was admitted to the hospital in August with bradycardia and weakness. Her labetolol was discontinued and amiodarone was reduced from 200 mg daily to 100 mg daily. She was seen in the office 01/24/16 with SOB and noted to be in A flutter with RVR. She was admitted for rate control and developed acute respiratory failure. She was found to have a LLE DVT despite Eliquis. It was determined she would require Coumadin Rx long term. Her respirator failure was felt to be multifactorial- secondary to suspected PE as well as acute diastolic CHF and acute bronchitis. She converted spontaneosuly to NSR on increased Amiodarone.   Principal Problem:   Acute respiratory failure with hypoxia (HCC) Active Problems:   Acute on chronic diastolic heart failure (HCC)   Atrial flutter (HCC)   Bradycardia   Asthma with allergic rhinitis   Chronic diastolic CHF (congestive heart failure) (HCC)   Acute renal insufficiency   Acute deep vein thrombosis (DVT) of left lower extremity (HCC)   Gastric outlet obstruction   Hypothyroidism due to medication   Respiratory alkalosis   PLAN: Heparin-Coumadin. Decrease Amiodarone at discharge to 200 mg BID. She lives with her daughter. She will need TOC OV after discharge.   Kerin Ransom PA-C 01/31/2016, 10:58 AM [phone]   Patient seen and examined. Agree with assessment and plan. Sinus rhythm with sinus arrhythmia on ECG; HR in the 60s - 70 range. Mild sob; no chest pain. Decreased BS with very faint end expiratory wheeze. Pt has received 4 days of amiodarone 400 mg bid. With age of 82 years, will decrease to 200 mg bid. Getting coumadinized; INR today 1.31, still on heparin until INR is therapeutic.    Troy Sine, MD, [facility] 01/31/2016 11:10 AM

## 2016-01-31 NOTE — Care Management Important Message (Signed)
Important Message  Patient Details  Name: Shannon [name] MRN: [id number] Date of Birth: [birth year]   Medicare Important Message Given:  Yes    Bethena Roys, RN 01/31/2016, 12:09 PM

## 2016-01-31 NOTE — Progress Notes (Signed)
ANTICOAGULATION CONSULT NOTE - Follow Up Consult  Pharmacy Consult for heparin and warfarin Indication: DVT, h/o a-fib  No Known Allergies  Patient Measurements: Height: 5\' 6"  (167.6 cm) Weight: 136 lb 6.4 oz (61.9 kg) IBW/kg (Calculated) : 59.3  Vital Signs: Temp: 98.3 F (36.8 C) (10/30 0500) Temp Source: Oral (10/30 0500) BP: 158/76 (10/30 0500) Pulse Rate: 78 (10/30 0500)  Labs:  Recent Labs  01/29/16 0549 01/30/16 0410 01/30/16 1137 01/31/16 0418  HGB 8.7* 8.6*  --  9.1*  HCT 26.1* 26.5*  --  27.1*  PLT 337 326  --  342  LABPROT 14.4 14.1  --  16.4*  INR 1.11 1.08  --  1.31  HEPARINUNFRC 0.74* 0.69  --  0.75*  CREATININE  --   --  1.30* 1.34*    Estimated Creatinine Clearance: 26.1 mL/min (by C-G formula based on SCr of 1.34 mg/dL (H)).  Assessment: 82 yo F with h/o a-fib, now with new DVT while on apixaban (last dose 10/24 at ~0930). Apixaban discontinued and pharmacy consulted to start warfarin with heparin bridge.   Heparin level is slightly above goal on 950 units/hr.  INR is subtherapeutic but trending up as expected.  Hgb low stable, Plt wnl, no bleeding documented.  Drug/Drug Interactions: Dosing warfarin cautiously d/t DDIs with amiodarone and azithromycin.  Goal of Therapy:  Heparin level 0.3-0.7 units/ml INR 2-3 Monitor platelets by anticoagulation protocol: Yes   Plan:  - Reduce heparin to850 units/hr. D/c onceINR >2 - Daily heparin level, INR, CBC - Warfarin 5 mg x 1 tonight - Monitor s/sx bleeding - Education with patient and daughter completed   Horton Chin, Pharm.D., BCPS Clinical Pharmacist Pager [phone] 01/31/2016 9:59 AM

## 2016-02-01 ENCOUNTER — Other Ambulatory Visit: Payer: Self-pay | Admitting: Nurse Practitioner

## 2016-02-01 DIAGNOSIS — J9600 Acute respiratory failure, unspecified whether with hypoxia or hypercapnia: Secondary | ICD-10-CM

## 2016-02-01 DIAGNOSIS — Z7901 Long term (current) use of anticoagulants: Secondary | ICD-10-CM

## 2016-02-01 DIAGNOSIS — I483 Typical atrial flutter: Secondary | ICD-10-CM

## 2016-02-01 LAB — PROTIME-INR
INR: 1.88
Prothrombin Time: 21.9 seconds — ABNORMAL HIGH (ref 11.4–15.2)

## 2016-02-01 LAB — BASIC METABOLIC PANEL
Anion gap: 7 (ref 5–15)
BUN: 47 mg/dL — ABNORMAL HIGH (ref 6–20)
CO2: 23 mmol/L (ref 22–32)
Calcium: 9.6 mg/dL (ref 8.9–10.3)
Chloride: 104 mmol/L (ref 101–111)
Creatinine, Ser: 1.41 mg/dL — ABNORMAL HIGH (ref 0.44–1.00)
GFR calc Af Amer: 37 mL/min — ABNORMAL LOW (ref >60)
GFR calc non Af Amer: 32 mL/min — ABNORMAL LOW (ref >60)
Glucose, Bld: 151 mg/dL — ABNORMAL HIGH (ref 65–99)
Potassium: 4.5 mmol/L (ref 3.5–5.1)
Sodium: 134 mmol/L — ABNORMAL LOW (ref 135–145)

## 2016-02-01 LAB — CBC
HCT: 27.6 % — ABNORMAL LOW (ref 36.0–46.0)
Hemoglobin: 9.2 g/dL — ABNORMAL LOW (ref 12.0–15.0)
MCH: 28.9 pg (ref 26.0–34.0)
MCHC: 33.3 g/dL (ref 30.0–36.0)
MCV: 86.8 fL (ref 78.0–100.0)
Platelets: 344 10*3/uL (ref 150–400)
RBC: 3.18 MIL/uL — ABNORMAL LOW (ref 3.87–5.11)
RDW: 16.8 % — ABNORMAL HIGH (ref 11.5–15.5)
WBC: 14.3 10*3/uL — ABNORMAL HIGH (ref 4.0–10.5)

## 2016-02-01 LAB — GLUCOSE, CAPILLARY
Glucose-Capillary: 126 mg/dL — ABNORMAL HIGH (ref 65–99)
Glucose-Capillary: 145 mg/dL — ABNORMAL HIGH (ref 65–99)
Glucose-Capillary: 164 mg/dL — ABNORMAL HIGH (ref 65–99)
Glucose-Capillary: 179 mg/dL — ABNORMAL HIGH (ref 65–99)
Glucose-Capillary: 216 mg/dL — ABNORMAL HIGH (ref 65–99)
Glucose-Capillary: 229 mg/dL — ABNORMAL HIGH (ref 65–99)

## 2016-02-01 LAB — HEPARIN LEVEL (UNFRACTIONATED): Heparin Unfractionated: 0.53 IU/mL (ref 0.30–0.70)

## 2016-02-01 MED ORDER — PREDNISONE 10 MG PO TABS: 30.0000 mg | ORAL_TABLET | Freq: Every day | ORAL | Status: DC

## 2016-02-01 MED ORDER — PREDNISONE 20 MG PO TABS: 20.0000 mg | ORAL_TABLET | Freq: Every day | ORAL | Status: DC

## 2016-02-01 MED ORDER — PREDNISONE 20 MG PO TABS
40.0000 mg | ORAL_TABLET | Freq: Every day | ORAL | Status: DC
  Administered 2016-02-02: 40 mg via ORAL
  Filled 2016-02-01: qty 2

## 2016-02-01 MED ORDER — WARFARIN SODIUM 5 MG PO TABS
5.0000 mg | ORAL_TABLET | Freq: Once | ORAL | Status: AC
  Administered 2016-02-01: 5 mg via ORAL
  Filled 2016-02-01: qty 1

## 2016-02-01 MED ORDER — PREDNISONE 10 MG PO TABS: 10.0000 mg | ORAL_TABLET | Freq: Every day | ORAL | Status: DC

## 2016-02-01 NOTE — Progress Notes (Signed)
ANTICOAGULATION CONSULT NOTE - Follow Up Consult  Pharmacy Consult for heparin and warfarin Indication: DVT, h/o a-fib  No Known Allergies  Patient Measurements: Height: 5\' 6"  (167.6 cm) Weight: 133 lb 14.4 oz (60.7 kg) IBW/kg (Calculated) : 59.3  Vital Signs: Temp: 97.7 F (36.5 C) (10/31 0459) Temp Source: Oral (10/31 0459) BP: 173/61 (10/31 0733) Pulse Rate: 65 (10/31 0733)  Labs:  Recent Labs  01/30/16 0410 01/30/16 1137 01/31/16 0418 02/01/16 0308  HGB 8.6*  --  9.1* 9.2*  HCT 26.5*  --  27.1* 27.6*  PLT 326  --  342 344  LABPROT 14.1  --  16.4* 21.9*  INR 1.08  --  1.31 1.88  HEPARINUNFRC 0.69  --  0.75* 0.53  CREATININE  --  1.30* 1.34* 1.41*    Estimated Creatinine Clearance: 24.8 mL/min (by C-G formula based on SCr of 1.41 mg/dL (H)).  Assessment: 82 yo F with h/o a-fib, now with new DVT while on apixaban (last dose 10/24 at ~0930). Apixaban discontinued and pharmacy consulted to start warfarin with heparin bridge.   Heparin level at goal on 950 units/hr.  INR 1.88 with trend up  Drug/Drug Interactions: Dosing warfarin cautiously d/t DDIs with amiodarone and azithromycin. Amiodarone dose decreased 10/30  Goal of Therapy:  Heparin level 0.3-0.7 units/ml INR 2-3 Monitor platelets by anticoagulation protocol: Yes   Plan:  -Continue heparin at 850 units/hr. D/c onceINR >2 - Daily heparin level, INR, CBC - Warfarin 5 mg x 1 tonight  Hildred Laser, Pharm D 02/01/2016 8:02 AM

## 2016-02-01 NOTE — Progress Notes (Signed)
Upon receiving report on pt, day shift nurse reported that pt's JTube port was clogged and not flushing at all. Pt has a double lumen port, with the gastric port flushing fine but the jejumen side clogged. Day shift RN reported multiple attempts to unclog line but was unsuccessful. On-call MD paged to advise for night time tube feeds. Dr. Dyann Kief returned paged and advised RN to hold tonight's feeds and issue would have to be addressed in the AM. Pt able to tolerate soft oral foods and will be supplemented if needed. No meds are being administered through tube. Will continue to monitor and assess.

## 2016-02-01 NOTE — H&P (Signed)
Expand All Collapse All      Cardiology Office Note     Date:  01/24/2016    ID:  Shannon [name], DOB 07-23-1923, MRN [id number]   PCP:  Shannon Boys, DO       Cardiologist: Dr. Delton [name]   No chief complaint on file.     History of Present Illness:  Shannon [name] is a 82 y.o. female with a history of paroxysmal atrial fibrillation on amiodarone and Eliquis  as well as a history of hypothyroidism, bradycardia, non-obstructive coronary artery disease, type 2 diabetes, hypertension, cerebro-vascular disease and COPD/ashtma. She was admitted to the hospital in August with bradycardia and weakness. Her labetolol was discontinued and amiodarone was reduced from 200 mg daily to 100 mg daily. She last saw Shannon Medici, PA for post hospital follow-up and was in normal sinus rhythm at 64 bpm.   Patient comes in today extremely short of breath. Her asthma has been acting up and she's been using her inhalers a lot. Yesterday she said her heart rate would jump from 50 bpm to 118 bpm. Same is happening today. She's also developed edema in her lower extremities. She was told she had carotid disease when she had a x-ray of her neck after a fall. Carotid Dopplers are ordered by primary care. She is in rapid atrial flutter and her O2 sats are 87% in the office.             Past Medical History:  Diagnosis Date  . Anemia      previous blood transfusions  . Arthritis      "all over"  . Asthma    . Bradycardia      topped BB and amiodarone decreased- junctional rhythm   . COPD (chronic obstructive pulmonary disease) (HCC)    . Depression      "light case"  . Gastric stenosis    . GERD (gastroesophageal reflux disease)    . History of stomach ulcers    . Hyperlipidemia    . Hypertension    . Hypothyroidism    . Paraesophageal hernia    . Perforated gastric ulcer (HCC)    . Pneumonia    . Renal insufficiency    . SIADH (syndrome of inappropriate ADH production) (HCC)      Shannon [name] 01/10/2015  .  Small bowel obstruction      "I don't know how many" (01/11/2015)  . Stroke (HCC)      "light one"  . Type II diabetes mellitus (HCC)    . Ventral hernia with bowel obstruction             Past Surgical History:  Procedure Laterality Date  . CATARACT EXTRACTION W/ INTRAOCULAR LENS  IMPLANT, BILATERAL      . CHOLECYSTECTOMY OPEN      . COLECTOMY      . ESOPHAGOGASTRODUODENOSCOPY N/A 01/19/2014    Procedure: ESOPHAGOGASTRODUODENOSCOPY (EGD);  Surgeon: Hilarie Fredrickson, MD;  Location: [facility]COPY;  Service: Endoscopy;  Laterality: N/A;  . ESOPHAGOGASTRODUODENOSCOPY N/A 01/20/2014    Procedure: ESOPHAGOGASTRODUODENOSCOPY (EGD);  Surgeon: Hilarie Fredrickson, MD;  Location: [facility] ENDOSCOPY;  Service: Endoscopy;  Laterality: N/A;  . ESOPHAGOGASTRODUODENOSCOPY N/A 03/19/2014    Procedure: ESOPHAGOGASTRODUODENOSCOPY (EGD);  Surgeon: Rachael Fee, MD;  Location: [facility] ENDOSCOPY;  Service: Endoscopy;  Laterality: N/A;  . ESOPHAGOGASTRODUODENOSCOPY N/A 07/08/2015    Procedure: ESOPHAGOGASTRODUODENOSCOPY (EGD);  Surgeon: Sherrilyn Rist, MD;  Location: [facility] ENDOSCOPY;  Service: Endoscopy;  Laterality: N/A;  . ESOPHAGOGASTRODUODENOSCOPY (  EGD) WITH PROPOFOL N/A 09/15/2015    Procedure: ESOPHAGOGASTRODUODENOSCOPY (EGD) WITH PROPOFOL;  Surgeon: Ruffin Frederick, MD;  Location: [facility] ENDOSCOPY;  Service: Gastroenterology;  Laterality: N/A;  . GASTROJEJUNOSTOMY        hx/notes 01/10/2015  . GASTROJEJUNOSTOMY N/A 09/23/2015    Procedure: OPEN GASTROJEJUNOSTOMY TUBE PLACEMENT;  Surgeon: De Blanch Kinsinger, MD;  Location: [facility] ORS;  Service: General;  Laterality: N/A;  . HERNIA REPAIR   2015  . IR GENERIC HISTORICAL   01/07/2016    IR GJ TUBE CHANGE 01/07/2016 Malachy Moan, MD [facility]-INTERV RAD  . LAPAROTOMY N/A 01/20/2015    Procedure: EXPLORATORY LAPAROTOMY;  Surgeon: Abigail Miyamoto, MD;  Location: [facility] OR;  Service: General;  Laterality: N/A;  . LYSIS OF ADHESION N/A 01/20/2015    Procedure: LYSIS OF ADHESIONS < 1  HOUR;  Surgeon: Abigail Miyamoto, MD;  Location: [facility] OR;  Service: General;  Laterality: N/A;  . TUBAL LIGATION      . VENTRAL HERNIA REPAIR   2015    incarcerated ventral hernia ([facility] 09/2013)/notes 01/10/2015      Current Medications:       Outpatient Medications Prior to Visit  Medication Sig Dispense Refill  . acetaminophen (TYLENOL) 325 MG tablet Take 650 mg by mouth 2 (two) times daily.       [name] ADVAIR DISKUS 250-50 MCG/DOSE AEPB INHALE 1 PUFF INTO THE LUNGS 2 (TWO) TIMES DAILY AS NEEDED FOR SHORTNESS OF BREATH   3  . albuterol (PROVENTIL HFA;VENTOLIN HFA) 108 (90 Base) MCG/ACT inhaler Inhale 2 puffs into the lungs every 6 (six) hours as needed for wheezing or shortness of breath. 1 Inhaler 2  . AMINO ACIDS-PROTEIN HYDROLYS PO Take 30 mLs by mouth 2 (two) times daily.      [name] amiodarone (PACERONE) 100 MG tablet Take 100 mg by mouth daily.      [name] amLODipine (NORVASC) 10 MG tablet TAKE 1 TABLET BY MOUTH DAILY FOR HYPERTENSION 30 tablet 3  . apixaban (ELIQUIS) 2.5 MG TABS tablet Take 1 tablet (2.5 mg total) by mouth 2 (two) times daily. 180 tablet 1  . atorvastatin (LIPITOR) 10 MG tablet Take 10 mg by mouth daily.      [name] b complex vitamins tablet Take 1 tablet by mouth daily.       . bisacodyl (DULCOLAX) 10 MG suppository Place 1 suppository (10 mg total) rectally daily as needed for moderate constipation. 12 suppository 0  . calcium-vitamin D (OSCAL WITH D) 500-200 MG-UNIT per tablet Take 1 tablet by mouth daily with breakfast.       . chlorhexidine (PERIDEX) 0.12 % solution Use as directed 15 mLs in the mouth or throat 2 (two) times daily.      . diclofenac sodium (VOLTAREN) 1 % GEL Apply 2 g topically daily as needed (for pain).       . feeding supplement (BOOST / RESOURCE BREEZE) LIQD Take 1 Container by mouth daily.      . fluticasone (FLONASE) 50 MCG/ACT nasal spray Place 2 sprays into both nostrils daily. 16 g 3  . gabapentin (NEURONTIN) 300 MG capsule Take 2 tablets in the morning, Take  1 tablet at noon, Take 2 tablets at night for pain 150 capsule 1  . hydrALAZINE (APRESOLINE) 50 MG tablet Take 1 tablet (50 mg total) by mouth 3 (three) times daily. 120 tablet 0  . ipratropium-albuterol (DUONEB) 0.5-2.5 (3) MG/3ML SOLN Take 3 mLs by nebulization every 6 (six) hours as needed (sob). Use 3 times  daily x 4 days then every 6 hours as needed. 360 mL 4  . isosorbide mononitrate (IMDUR) 30 MG 24 hr tablet TAKE 1 TABLET (30 MG TOTAL) BY MOUTH DAILY.   2  . latanoprost (XALATAN) 0.005 % ophthalmic solution Place 1 drop into both eyes at bedtime. 2.5 mL 12  . levothyroxine (SYNTHROID, LEVOTHROID) 137 MCG tablet Take 1 tablet (137 mcg total) by mouth daily before breakfast. 30 tablet 2  . Maltodextrin-Xanthan Gum (RESOURCE THICKENUP CLEAR) POWD Take 120 g by mouth as needed (use to thicken liquids.). 20 Can 0  . Multiple Vitamins-Minerals (MULTIVITAMIN PO) Take 1 tablet by mouth daily.      . NONFORMULARY OR COMPOUNDED ITEM Apply 1-2 g topically 4 (four) times daily. Peripheral Neuropathy cream: Bupivacaine 1% Doxepin 3% Gabapentin 6% Ibuprofen 3% Pentoxifylline 3% 120 each 2  . Nutritional Supplements (FEEDING SUPPLEMENT, OSMOLITE 1.5 CAL,) LIQD Place 1,000 mLs into feeding tube daily. 1000 mL 3  . nystatin (NYSTATIN) powder Apply topically daily.      . ondansetron (ZOFRAN) 4 MG tablet Take 1 tablet (4 mg total) by mouth every 8 (eight) hours as needed for nausea or vomiting. 20 tablet 0  . pantoprazole (PROTONIX) 40 MG tablet TAKE 1 TABLET BY MOUTH TWICE A DAY 60 tablet 5  . Polyethyl Glycol-Propyl Glycol 0.4-0.3 % SOLN Place 1 drop into both eyes 4 (four) times daily.       . polyethylene glycol powder (GLYCOLAX/MIRALAX) powder MIX 17GRAMS IN 8 OUNCES OF LIQUID AND DRINK TWICE DAILY AS NEEDED 3162 g 1  . RESTASIS 0.05 % ophthalmic emulsion USE 1 DROP INTO BOTH EYES TWICE DAILY 60 mL 0  . sertraline (ZOLOFT) 100 MG tablet Take one tablet by mouth once daily for depression 90 tablet 1  .  simethicone (MYLICON) 80 MG chewable tablet Chew 80 mg by mouth 4 (four) times daily as needed for flatulence.      . sodium chloride 1 g tablet TAKE 1 TABLET (1 g) BY MOUTH THREE TIMES DAILY 90 tablet 2  . traMADol (ULTRAM) 50 MG tablet Take 50 mg by mouth daily as needed for moderate pain.       . Water For Irrigation, Sterile (FREE WATER) SOLN Place 60 mLs into feeding tube every 6 (six) hours.        No facility-administered medications prior to visit.       Allergies:   Review of patient's allergies indicates no known allergies.    Social History         Social History  . Marital status: Widowed      Spouse name: N/A  . Number of children: 3  . Years of education: N/A         Social History Main Topics  . Smoking status: Never Smoker  . Smokeless tobacco: Former Neurosurgeon      Types: Snuff  . Alcohol use No  . Drug use: No  . Sexual activity: No        Other Topics Concern  . None       Social History Narrative    Married in [place], lives in one story house with 2 people and no pets    Occupation: ?    Has a living Will, [place], and doesn't have DNR    Was living with Husband (in frail health) in [place] until 09/2013.  After her [facility] admission they both came to live with daughter in [place]  History:  The patient's family history includes Diabetes in her brother; Hypertension in her mother; Stroke in her mother.    ROS:   Please [name] the history of present illness.    Review of Systems  Eyes: Positive for visual disturbance.  Cardiovascular: Positive for chest pain, dyspnea on exertion, leg swelling and orthopnea.  Respiratory: Positive for cough, shortness of breath and wheezing.   Musculoskeletal: Positive for back pain, muscle weakness and myalgias.  Gastrointestinal: Positive for abdominal pain, nausea and vomiting.  Neurological: Positive for loss of balance.  Psychiatric/Behavioral: The patient is nervous/anxious.    All other systems reviewed  and are negative.     PHYSICAL EXAM:   VS:  BP (!) 162/70   Pulse (!) 115   Ht 5\' 6"  (1.676 m)   Wt 148 lb 6.4 oz (67.3 kg)   SpO2 98%   BMI 23.95 kg/m   Physical Exam  GEN: Elderely, acutely ill female, short of breath. HEENT:normal  Neck: increased JVD, carotid bruits, no masses Cardiac:Irreg irreg 2/6 systolic murmur LSB, no rubs, or gallops  Respiratory:  Decreased breath sounds with diffuse wheezing throughout. GI: soft, nontender, nondistended, + BS Ext: plus 1-2 edema bilaterally. without cyanosis, clubbing  Good distal pulses bilaterally MS: no deformity or atrophy  Skin: warm and dry, no rash Neuro:  Alert and Oriented x 3, Strength and sensation are intact Psych: euthymic mood, full affect      Wt Readings from Last 3 Encounters:  01/24/16 148 lb 6.4 oz (67.3 kg)  12/29/15 151 lb 12.8 oz (68.9 kg)  11/25/15 140 lb 6 oz (63.7 kg)        Studies/Labs Reviewed:    EKG:  EKG is ordered today.  The ekg ordered today demonstrates atrial flutter at 113/m   Recent Labs: 08/26/2015: B Natriuretic Peptide 39.0 11/06/2015: ALT 23 11/11/2015: Hemoglobin 8.3; Magnesium 1.8; Platelets 287 12/13/2015: BUN 36; Creatinine 1.3; Potassium 5.1; Sodium 138 12/24/2015: TSH 42.44    Lipid Panel Labs (Brief)          Component Value Date/Time    CHOL 126 04/07/2015 1015    TRIG 107 04/07/2015 1015    HDL 69 04/07/2015 1015    CHOLHDL 1.8 04/07/2015 1015    CHOLHDL 1.9 12/24/2013 0456    VLDL 26 12/24/2013 0456    LDLCALC 36 04/07/2015 1015        Additional studies/ records that were reviewed today include:      IMPRESSION: 1.  Diffuse multilevel degenerative change.   2.  Bilateral carotid vascular disease .     Electronically Signed   By: Maisie Fus  Register   On: 01/04/2016 08:20   2Decho 07/08/15 Study Conclusions   - Left ventricle: The cavity size was normal. Wall thickness was   increased in a pattern of mild LVH. Systolic function was normal.   The estimated  ejection fraction was in the range of 60% to 65%.   Doppler parameters are consistent with abnormal left ventricular   relaxation (grade 1 diastolic dysfunction). - Aortic valve: There was mild regurgitation. - Mitral valve: There was mild regurgitation. - Left atrium: The atrium was moderately to severely dilated.         ASSESSMENT:     1. Atrial flutter, unspecified type (HCC)   2. Acute on chronic diastolic heart failure (HCC)   3. Asthma with allergic rhinitis with acute exacerbation, unspecified asthma severity, unspecified whether persistent   4. Essential hypertension  PLAN:  In order of problems listed above:   Atrial flutter at 113 bpm. Discussed with Dr. Clifton James who examined patient and recommended hospitalization and treatment with IV amiodarone to help control her heart rate. We will not add diltiazem her beta blocker at this time because of bradycardia in August.   Acute on chronic diastolic heart failure. Has been off her diuretic. Also secondary to atrial flutter.   Asthma with acute exacerbation will need a nebulizer and possible hospitalist to follow   Essential hypertension blood pressure is elevated today       Medication Adjustments/Labs and Tests Ordered: Current medicines are reviewed at length with the patient today.  Concerns regarding medicines are outlined above.  Medication changes, Labs and Tests ordered today are listed in the Patient Instructions below. There are no Patient Instructions on file for this visit.    Elson Clan, PA-C  01/24/2016 3:05 PM    [facility] [address] [place], [place], [place]  [address] Phone: [phone]; Fax: [phone]    I have personally seen and examined this patient with Herma Carson, PA-C. I agree with the assessment and plan as outlined above. She presents to the office today with c/o dyspnea, LE edema. She is profoundly dyspneic on exam. She is now in atrial flutter  with RVR.  My exam shows an elderly female, appears dyspneic, accessory muscle use. LK:GMWNU, regular. No loud murmurs. Ext: 1+ bilateral LE edema. Will admit to [facility] and plan diuresis with IV lasix. Check BNP, d-dimer. IV amiodarone for rate control. Chest x-ray today. She has no fever, chills, rigors. Check BMET, CBC.    Verne Carrow 01/24/2016 4:24 PM

## 2016-02-01 NOTE — Progress Notes (Signed)
Patient Name: Shannon [name] Date of Encounter: 02/01/2016  Primary Cardiologist: Dr. Rhona Raider Problem List     Principal Problem:   Acute respiratory failure with hypoxia [facility]) Active Problems:   Gastric outlet obstruction   Bradycardia   Asthma with allergic rhinitis   Acute on chronic diastolic heart failure (HCC)   Hypothyroidism due to medication   Atrial flutter (HCC)   Chronic diastolic CHF (congestive heart failure) (HCC)   Acute renal insufficiency   Acute deep vein thrombosis (DVT) of left lower extremity (HCC)   Respiratory alkalosis     Subjective   Feels well, says she does feel "short winded at times". Denies chest pain.   Inpatient Medications    Scheduled Meds: . acetaminophen  650 mg Oral BID  . amiodarone  200 mg Oral BID  . amLODipine  10 mg Oral Daily  . atorvastatin  10 mg Oral q1800  . B-complex with vitamin C  1 tablet Oral Daily  . calcium-vitamin D  1 tablet Oral Q breakfast  . chlorhexidine  15 mL Mouth/Throat BID  . cycloSPORINE  1 drop Both Eyes BID  . erythromycin   Both Eyes Q8H  . feeding supplement  1 Container Oral Daily  . feeding supplement (OSMOLITE 1.5 CAL)  1,000 mL Per Tube Q24H  . fluticasone  1 spray Each Nare BID  . free water  60 mL Per Tube Q6H  . gabapentin  300 mg Oral TID  . hydrALAZINE  50 mg Oral TID  . insulin aspart  0-15 Units Subcutaneous Q4H  . ipratropium-albuterol  3 mL Nebulization TID  . isosorbide mononitrate  30 mg Oral Daily  . latanoprost  1 drop Both Eyes QHS  . levothyroxine  137 mcg Oral QAC breakfast  . methylPREDNISolone (SOLU-MEDROL) injection  40 mg Intravenous Daily  . mometasone-formoterol  2 puff Inhalation BID  . pantoprazole  40 mg Oral BID  . polyethylene glycol  17 g Oral BID  . polyvinyl alcohol  1 drop Both Eyes QID  . sertraline  100 mg Oral QHS  . sodium chloride flush  3 mL Intravenous Q12H  . sodium chloride flush  3 mL Intravenous Q12H  . warfarin   Does not apply Once   . Warfarin - Pharmacist Dosing Inpatient   Does not apply q1800   Continuous Infusions: . sodium chloride    . heparin 850 Units/hr (01/31/16 0959)   PRN Meds: sodium chloride, acetaminophen, benzonatate, bisacodyl, levalbuterol, ondansetron (ZOFRAN) IV, ondansetron, RESOURCE THICKENUP CLEAR, simethicone, sodium chloride flush, sodium chloride flush, traMADol   Vital Signs    Vitals:   02/01/16 0002 02/01/16 0459 02/01/16 0503 02/01/16 0733  BP: (!) 152/59 (!) 173/61  (!) 173/61  Pulse: 72 75  65  Resp: 14 15  18   Temp: 98.4 F (36.9 C) 97.7 F (36.5 C)    TempSrc: Oral Oral    SpO2: 100% 100%  100%  Weight:   133 lb 14.4 oz (60.7 kg)   Height:        Intake/Output Summary (Last 24 hours) at 02/01/16 0923 Last data filed at 02/01/16 0831  Gross per 24 hour  Intake          1088.48 ml  Output             1325 ml  Net          -236.52 ml   Filed Weights   01/30/16 0417 01/31/16 0500 02/01/16 0503  Weight:  137 lb 8 oz (62.4 kg) 136 lb 6.4 oz (61.9 kg) 133 lb 14.4 oz (60.7 kg)    Physical Exam   GEN: Elderly female in no acute distress.  HEENT: Grossly normal, poor dentition  Neck: Supple, no JVD, carotid bruits, or masses. Cardiac: RRR, no murmurs, rubs, or gallops. No clubbing, cyanosis, edema.  Radials/DP/PT 2+ and equal bilaterally.  Respiratory:  Respirations regular and labored. Clear to ausculation.  GI: Soft, nontender, nondistended, BS + x 4. MS: no deformity or atrophy. Skin: warm and dry, no rash. Neuro:  Strength and sensation are intact. Psych: AAOx3.  Normal affect.  Labs    CBC  Recent Labs  01/31/16 0418 02/01/16 0308  WBC 15.5* 14.3*  HGB 9.1* 9.2*  HCT 27.1* 27.6*  MCV 86.9 86.8  PLT 342 782   Basic Metabolic Panel  Recent Labs  01/31/16 0418 02/01/16 0308  NA 134* 134*  K 4.3 4.5  CL 102 104  CO2 22 23  GLUCOSE 162* 151*  BUN 47* 47*  CREATININE 1.34* 1.41*  CALCIUM 9.7 9.6     Telemetry    NSR - Personally  Reviewed  ECG     01/25/16 - NSR - Personally Reviewed  Radiology    No results found.  Cardiac Studies   Transthoracic Echocardiography 01/27/16 Study Conclusions  - Left ventricle: The cavity size was normal. Wall thickness was   normal. Systolic function was normal. The estimated ejection   fraction was in the range of 55% to 60%. Wall motion was normal;   there were no regional wall motion abnormalities. Doppler   parameters are consistent with abnormal left ventricular   relaxation (grade 1 diastolic dysfunction). - Aortic valve: There was mild regurgitation. - Mitral valve: There was mild regurgitation. - Left atrium: The atrium was moderately dilated.  Impressions:  - Normal LV systolic function; grade 1 diastolic dysfunction; mild   AI; mild MR; moderate LAE.  Patient Profile     Shannon [name] is a 82 year old female with a past medical history of PAF, hypothyroidism, DM, carotid artery diease and nonobstructive CAD. She presented to our office on 01/24/16 in Aflutter RVR and extremely dyspneic. Required Bipap and IV diuresis. She was found to have a LLE DVT despite Eliquis. It was determined she would require Coumadin Rx long term. Her respirator failure was felt to be multifactorial- secondary to suspected PE as well as acute diastolic CHF and acute bronchitis.  Assessment & Plan    1. Atrial flutter with RVR: Resolved, in NSR. On po Amiodarone now, and rate controlled. Dose decreased to 200mg  BID.   This patients CHA2DS2-VASc Score and unadjusted Ischemic Stroke Rate (% per year) is equal to 6.   Above score calculated as 1 point each if present [CHF, HTN, DM, Vascular=MI/PAD/Aortic Plaque, Age if 65-74, or Female], 2 points each if present [Age > 75, or Stroke/TIA/TE]  On heparin bridge to Coumadin.   2. Acute on chronic diastolic CHF: stable, still with some dyspnea, but she takes deep gasps of air as she talks, also has some underlying COPD. Not on a diuretic  currently.   3. LLE DVT: on Coumadin. Stable.   4. Acute renal insufficiency: Stable.    Signed, Arbutus Leas, NP  02/01/2016, 9:23 AM    Patient seen and examined. Agree with assessment and plan. Feels well. No CP.  NSR at 80.  INR today 1.88; still on heparin, can DC when INR >2.0.   Tolerating  amiodarone, now at reduced dose 200 mg bid. Would continue for ~ 1 month, then can further reduce to 200 mg daily if maintaining NSR.    Troy Sine, MD, [employer] 02/01/2016 10:54 AM

## 2016-02-02 ENCOUNTER — Telehealth: Payer: Self-pay | Admitting: Cardiology

## 2016-02-02 LAB — PROTIME-INR
INR: 2.18
Prothrombin Time: 24.6 seconds — ABNORMAL HIGH (ref 11.4–15.2)

## 2016-02-02 LAB — GLUCOSE, CAPILLARY
Glucose-Capillary: 100 mg/dL — ABNORMAL HIGH (ref 65–99)
Glucose-Capillary: 116 mg/dL — ABNORMAL HIGH (ref 65–99)
Glucose-Capillary: 125 mg/dL — ABNORMAL HIGH (ref 65–99)
Glucose-Capillary: 179 mg/dL — ABNORMAL HIGH (ref 65–99)
Glucose-Capillary: 193 mg/dL — ABNORMAL HIGH (ref 65–99)

## 2016-02-02 MED ORDER — AMIODARONE HCL 200 MG PO TABS: 200.0000 mg | ORAL_TABLET | Freq: Two times a day (BID) | ORAL | 0 refills | Status: DC

## 2016-02-02 MED ORDER — PREDNISONE 20 MG PO TABS: 20.0000 mg | ORAL_TABLET | Freq: Every day | ORAL | 0 refills | Status: DC

## 2016-02-02 MED ORDER — AMIODARONE HCL 200 MG PO TABS: 200.0000 mg | ORAL_TABLET | Freq: Every day | ORAL | 12 refills | Status: DC

## 2016-02-02 MED ORDER — PREDNISONE 20 MG PO TABS: 40.0000 mg | ORAL_TABLET | Freq: Every day | ORAL | 0 refills | Status: DC

## 2016-02-02 MED ORDER — WARFARIN SODIUM 5 MG PO TABS: 5.0000 mg | ORAL_TABLET | Freq: Once | ORAL | 12 refills | Status: DC

## 2016-02-02 MED ORDER — WARFARIN SODIUM 5 MG PO TABS
5.0000 mg | ORAL_TABLET | Freq: Once | ORAL | Status: AC
  Administered 2016-02-02: 5 mg via ORAL
  Filled 2016-02-02: qty 1

## 2016-02-02 MED ORDER — EXENATIDE 5 MCG/0.02ML ~~LOC~~ SOPN: 5.0000 ug | PEN_INJECTOR | Freq: Two times a day (BID) | SUBCUTANEOUS | 12 refills | Status: DC

## 2016-02-02 MED ORDER — GABAPENTIN 300 MG PO CAPS: 300.0000 mg | ORAL_CAPSULE | Freq: Three times a day (TID) | ORAL | 12 refills | Status: DC

## 2016-02-02 MED ORDER — PREDNISONE 10 MG PO TABS: 10.0000 mg | ORAL_TABLET | Freq: Every day | ORAL | 0 refills | Status: DC

## 2016-02-02 MED ORDER — PREDNISONE 10 MG PO TABS: 30.0000 mg | ORAL_TABLET | Freq: Every day | ORAL | 0 refills | Status: DC

## 2016-02-02 NOTE — Discharge Summary (Signed)
Discharge Summary    Patient ID: Shannon [name],  MRN: [id number], DOB/AGE: 1923/06/23 82 y.o.  Admit date: 01/24/2016 Discharge date: 02/02/2016  Primary Care Provider: Kirt Boys Primary Cardiologist: Dr. Delton See   Discharge Diagnoses    Principal Problem:   Acute respiratory failure with hypoxia [facility]) Active Problems:   Gastric outlet obstruction   Bradycardia   Asthma with allergic rhinitis   Acute on chronic diastolic heart failure (HCC)   Hypothyroidism due to medication   Atrial flutter (HCC)   Chronic diastolic CHF (congestive heart failure) (HCC)   Acute renal insufficiency   Acute deep vein thrombosis (DVT) of left lower extremity (HCC)   Respiratory alkalosis   Warfarin anticoagulation   Allergies No Known Allergies  Diagnostic Studies/Procedures  Transthoracic Echocardiography 01/27/16 Study Conclusions  - Left ventricle: The cavity size was normal. Wall thickness was   normal. Systolic function was normal. The estimated ejection   fraction was in the range of 55% to 60%. Wall motion was normal;   there were no regional wall motion abnormalities. Doppler   parameters are consistent with abnormal left ventricular   relaxation (grade 1 diastolic dysfunction). - Aortic valve: There was mild regurgitation. - Mitral valve: There was mild regurgitation. - Left atrium: The atrium was moderately dilated.  Impressions:  - Normal LV systolic function; grade 1 diastolic dysfunction; mild   AI; mild MR; moderate LAE.     _____________   History of Present Illness   Shannon Smithis a 82 y.o.femalewith a history of paroxysmal atrial fibrillation on amiodarone and Eliquis, as well as a history of hypothyroidism,bradycardia, non-obstructive coronary artery disease, type 2 diabetes, hypertension, cerebro-vascular disease and COPD/ashtma.She was admitted to the hospital in August with bradycardia and weakness. Her labetololwas discontinued and amiodarone  was reduced from 200 mg daily to 100 mg daily. She last saw Boyce Medici, [place] for post hospital follow-up and was in normal sinus rhythm at 64 bpm.  Patient presented to our office on 01/24/16 extremely short of breath. Her asthma has been acting up and she's been using her inhalers a lot. Yesterday she said her heart rate would jump from 50 bpm to 118 bpm. Same is happening today. She's also developed edema in her lower extremities. She was told she had carotid disease when she had a x-ray of her neck after a fall. Carotid Dopplers are ordered by primary care. She is in rapid atrial flutter and her O2 sats are 87% in the office. She was transferred to [facility] for further evaluation.    Hospital Course  Initially, Shannon [name] required Bipap as she was hypoxic. This improved with IV diuresis. She was also placed on Amiodarone gtt, and converted to NSR on 01/25/16.   She was noted to have a left lower extremity DVT, and her Eliquis was held and she was started on IV heparin. Hematology saw her and recommended Xarelto but with her GFR < 30, Coumadin was started.   She has a J port for gastric outlet obstruction, and her J tube was clogged during admission and IR was consulted and they changed the tube out.   She is rate controlled with her Afib, Amiodarone will be dosed at 200mg  BID for 2 weeks and then will decrease to 200 mg daily (if she is still holding NSR).   She was seen today by Dr. Tresa Endo and deemed suitable for discharge.  _____________  Discharge Vitals Blood pressure 112/64, pulse 66, temperature 97.9 F (36.6 C),  temperature source Oral, resp. rate 15, height 5\' 6"  (1.676 m), weight 131 lb 14.4 oz (59.8 kg), SpO2 100 %.  Filed Weights   01/31/16 0500 02/01/16 0503 02/02/16 0436  Weight: 136 lb 6.4 oz (61.9 kg) 133 lb 14.4 oz (60.7 kg) 131 lb 14.4 oz (59.8 kg)    Labs & Radiologic Studies     CBC  Recent Labs  01/31/16 0418 02/01/16 0308  WBC 15.5* 14.3*  HGB 9.1* 9.2*   HCT 27.1* 27.6*  MCV 86.9 86.8  PLT 342 344   Basic Metabolic Panel  Recent Labs  01/31/16 0418 02/01/16 0308  NA 134* 134*  K 4.3 4.5  CL 102 104  CO2 22 23  GLUCOSE 162* 151*  BUN 47* 47*  CREATININE 1.34* 1.41*  CALCIUM 9.7 9.6    Dg Cervical Spine 2 Or 3 Views  Result Date: 01/04/2016 CLINICAL DATA:  Fall.  Neck pain. EXAM: CERVICAL SPINE - 2-3 VIEW COMPARISON:  CT 11/10/2015. FINDINGS: Diffuse multilevel degenerative change. Degenerative changes most severe at C3-C4 on C5-C6. No acute abnormality identified. No evidence of fracture or dislocation. Bilateral carotid vascular calcification. Pulmonary apices are clear. IMPRESSION: 1.  Diffuse multilevel degenerative change. 2.  Bilateral carotid vascular disease . Electronically Signed   By: Maisie Fus  Register   On: 01/04/2016 08:20   Ir Gj Tube Change  Result Date: 01/07/2016 INDICATION: 82 year old female with capacious stomach and gastroparesis. She currently feeds via a percutaneous gastro jejunostomy tube. Unfortunately, the jejunal arm has pulled back into the stomach and patient is experiencing nausea and vomiting following feedings. EXAM: JEJUNAL CATHETER REPLACEMENT MEDICATIONS: None ANESTHESIA/SEDATION: None CONTRAST:  80 mL Isovue 300 - administered into the gastric lumen. FLUOROSCOPY TIME:  Fluoroscopy Time: 18 minutes 54 seconds (305 mGy). COMPLICATIONS: None immediate. PROCEDURE: Informed written consent was obtained from the patient after a thorough discussion of the procedural risks, benefits and alternatives. All questions were addressed. Maximal Sterile Barrier Technique was utilized including caps, mask, sterile gowns, sterile gloves, sterile drape, hand hygiene and skin antiseptic. A timeout was performed prior to the initiation of the procedure. The existing percutaneous gastrojejunostomy tube was removed following deflation of the retention balloon. A C2 cobra catheter was then inserted through the ostium and  navigated gastric antrum. It is extremely difficult to maneuver the catheter within the stomach. The body and fundus of the stomach are extremely capacious. A large volume of contrast and saline were injected in the region of the antrum in order to opacify and define the anatomy. Ultimately, the C2 cobra catheter was advanced deep into the gastric antrum then exchanged over a stiff Amplatz wire for a Barrett's teen catheter which was then advanced through the pylorus and into the proximal jejunum with the assistance of a road runner wire. The catheter and wire were advanced all the way to the ligament Treitz. A new 22 French percutaneous gastrojejunostomy tube was then advanced over the wire and positioned with the tip in the proximal small bowel. Unfortunately, the length of the tube is greater than the distance to the ligament of Treitz and beyond the ligament Treitz the small bowel is extremely tortuous. The final segment of gastrostomy tube pushing into the stomach created a small loop heading toward the fundus. The weighted tip remains within the proximal small bowel. This was confirmed by a gentle hand injection of contrast material. The retention balloon was inflated and pulled snug against the anterior abdominal wall. The external bumper was fixed in place. IMPRESSION:  1. Successful replacement of a 22 French percutaneous gastro jejunostomy tube with the tube tip in the proximal small bowel. Unfortunately, due to a size mismatch between the very long tube and the relatively small patient there is still a loop of redundancy approximately in the stomach. PLAN: Tube is currently ready for use. At some point in the future, the tip of the J arm may again migrate into the fundus of the stomach. If this happens, replacement should be either with a modified 22 French gastrojejunostomy tube which has been cut shorter (approximately 10-15 cm) or a shorter direct percutaneous jejunostomy tube given that the family  really uses the gastrostomy portion. Findings were discussed with the patient's daughter voiced her understanding. Signed, Sterling Big, MD [facility]iology Electronically Signed   By: Malachy Moan M.D.   On: 01/07/2016 15:45   Ir Mech Vito Berger Obstruc Mat Any Colon Tube W/fluoro  Result Date: 01/27/2016 INDICATION: The jejunal portion of the GJ tube is occluded. EXAM: MECH REMOVAL GI TUBE OBSTRUCTION MEDICATIONS: None ANESTHESIA/SEDATION: None CONTRAST:  None FLUOROSCOPY TIME:  None COMPLICATIONS: None immediate. PROCEDURE: The jejunal tube was occluded. Adaptor piece was placed at the end of the jejunal tube and this tube was flushed with a 10 mL syringe by the IR technologist. The tube eventually cleared using this technique. The jejunal tube was flushed with saline. The gastric tube was also flushed to ensure patency. IMPRESSION: The jejunal portion of the GJ tube was successfully unclogged. Electronically Signed   By: Richarda Overlie M.D.   On: 01/27/2016 15:16   Dg Chest Port 1 View  Result Date: 01/28/2016 CLINICAL DATA:  Wheezing and short of breath EXAM: PORTABLE CHEST 1 VIEW COMPARISON:  01/24/2016 FINDINGS: Elevated right hemidiaphragm is chronic and unchanged. Heart size mildly enlarged. Negative for heart failure. Negative for pneumonia. Small left pleural effusion unchanged from the prior study. Atherosclerotic calcification aortic arch IMPRESSION: Chronic elevation right hemidiaphragm. Negative for heart failure or pneumonia. Small left pleural effusion or scarring unchanged. Electronically Signed   By: Marlan Palau M.D.   On: 01/28/2016 10:51   Dg Chest Port 1 View  Result Date: 01/24/2016 CLINICAL DATA:  Shortness of breath. EXAM: PORTABLE CHEST 1 VIEW COMPARISON:  11/09/2015. FINDINGS: Trachea is midline. Heart size stable. Thoracic aorta is calcified. Lungs are clear. No pleural fluid. Right hemidiaphragm is elevated.  Degenerative changes are seen in the glenohumeral joints bilaterally. IMPRESSION: No acute findings. Electronically Signed   By: Leanna Battles M.D.   On: 01/24/2016 17:13   Dg Abd 2 Views  Result Date: 01/04/2016 CLINICAL DATA:  Abdominal bloating and pain near GJ tube for a few weeks. EXAM: ABDOMEN - 2 VIEW COMPARISON:  Abdominal radiograph and CT abdomen and pelvis 11/06/2015 FINDINGS: The gastrojejunostomy tube is coiled in the stomach with tip at the level of the proximal gastric body. The tube extended into the duodenum on the prior CT. Gas is present in nondilated colon. No dilated loops of bowel are seen to suggest obstruction. There is no evidence of intraperitoneal free air. Right upper quadrant surgical clips are present, and a bowel anastomosis is present in the left mid abdomen. The visualized lung bases are grossly clear. No acute osseous abnormality is identified. IMPRESSION: 1. GJ tube coiled in the stomach. The tip was in the duodenum on the prior CT. 2. No evidence of bowel obstruction. Electronically Signed   By: Sebastian Ache M.D.   On: 01/04/2016 08:25  Ct Maxillofacial Ltd Wo Cm  Result Date: 01/27/2016 CLINICAL DATA:  Pain behind left eye and left year. EXAM: CT PARANASAL SINUS LIMITED WITHOUT CONTRAST TECHNIQUE: Non-contiguous multidetector CT images of the paranasal sinuses were obtained in a single plane without contrast. COMPARISON:  Head CT 11/06/2015 FINDINGS: There is no paranasal sinus mucosal thickening. The sinus drainage pathways are patent. There is a right concha bullosa. Unremarkable visualized intracranial structures. Atherosclerotic calcification within the proximal intracranial internal carotid arteries and vertebral arteries. Normal appearance of the orbits. IMPRESSION: No evidence of acute or chronic sinusitis. Electronically Signed   By: Deatra Robinson M.D.   On: 01/27/2016 18:16    Disposition   Pt is being discharged home today in good  condition.  Follow-up Plans & Appointments    Follow-up Information    [facility] .   Specialty:  Home Health Services Why:  Registered Nurse, Physical Therapy Contact information: [address] [place] [address] [phone]          Discharge Instructions    Diet - low sodium heart healthy    Complete by:  As directed    Increase activity slowly    Complete by:  As directed       Discharge Medications   Current Discharge Medication List    START taking these medications   Details  !! predniSONE (DELTASONE) 10 MG tablet Take 3 tablets (30 mg total) by mouth daily with breakfast. Qty: 3 tablet, Refills: 0    !! predniSONE (DELTASONE) 10 MG tablet Take 1 tablet (10 mg total) by mouth daily with breakfast. Qty: 1 tablet, Refills: 0    !! predniSONE (DELTASONE) 20 MG tablet Take 2 tablets (40 mg total) by mouth daily with breakfast. Qty: 2 tablet, Refills: 0    !! predniSONE (DELTASONE) 20 MG tablet Take 1 tablet (20 mg total) by mouth daily with breakfast. Qty: 1 tablet, Refills: 0    warfarin (COUMADIN) 5 MG tablet Take 1 tablet (5 mg total) by mouth one time only at 6 PM. Qty: 30 tablet, Refills: 12     !! - Potential duplicate medications found. Please discuss with provider.    CONTINUE these medications which have CHANGED   Details  !! amiodarone (PACERONE) 200 MG tablet Take 1 tablet (200 mg total) by mouth 2 (two) times daily. Qty: 28 tablet, Refills: 0    !! amiodarone (PACERONE) 200 MG tablet Take 1 tablet (200 mg total) by mouth daily. Qty: 30 tablet, Refills: 12    gabapentin (NEURONTIN) 300 MG capsule Take 1 capsule (300 mg total) by mouth 3 (three) times daily. Qty: 90 capsule, Refills: 12     !! - Potential duplicate medications found. Please discuss with provider.    CONTINUE these medications which have NOT CHANGED   Details  acetaminophen (TYLENOL) 325 MG tablet Take 650 mg by mouth 2 (two) times daily as  needed for mild pain.     ADVAIR DISKUS 250-50 MCG/DOSE AEPB INHALE 1 PUFF INTO THE LUNGS 2 (TWO) TIMES DAILY AS NEEDED FOR SHORTNESS OF BREATH Refills: 3    albuterol (PROVENTIL HFA;VENTOLIN HFA) 108 (90 Base) MCG/ACT inhaler Inhale 2 puffs into the lungs every 6 (six) hours as needed for wheezing or shortness of breath. Qty: 1 Inhaler, Refills: 2   Associated Diagnoses: Essential hypertension, benign; Chronic atrial fibrillation (HCC)    AMINO ACIDS-PROTEIN HYDROLYS PO Take 30 mLs by mouth 2 (two) times daily.    amLODipine (NORVASC)  10 MG tablet TAKE 1 TABLET BY MOUTH DAILY FOR HYPERTENSION Qty: 30 tablet, Refills: 3    atorvastatin (LIPITOR) 10 MG tablet Take 10 mg by mouth daily.   Associated Diagnoses: Bradycardia    b complex vitamins tablet Take 1 tablet by mouth daily.     bisacodyl (DULCOLAX) 10 MG suppository Place 1 suppository (10 mg total) rectally daily as needed for moderate constipation. Qty: 12 suppository, Refills: 0    calcium-vitamin D (OSCAL WITH D) 500-200 MG-UNIT per tablet Take 1 tablet by mouth daily with breakfast.     cetirizine (ZYRTEC) 10 MG tablet Take 10 mg by mouth daily as needed for allergies or rhinitis.    chlorhexidine (PERIDEX) 0.12 % solution Use as directed 15 mLs in the mouth or throat 2 (two) times daily.    diclofenac sodium (VOLTAREN) 1 % GEL Apply 2 g topically daily as needed (for pain).     !! feeding supplement (BOOST / RESOURCE BREEZE) LIQD Take 1 Container by mouth daily.    fluticasone (FLONASE) 50 MCG/ACT nasal spray Place 2 sprays into both nostrils daily. Qty: 16 g, Refills: 3    hydrALAZINE (APRESOLINE) 50 MG tablet Take 1 tablet (50 mg total) by mouth 3 (three) times daily. Qty: 120 tablet, Refills: 0    ipratropium-albuterol (DUONEB) 0.5-2.5 (3) MG/3ML SOLN Take 3 mLs by nebulization every 6 (six) hours as needed (sob). Use 3 times daily x 4 days then every 6 hours as needed. Qty: 360 mL, Refills: 4    isosorbide  mononitrate (IMDUR) 30 MG 24 hr tablet TAKE 1 TABLET (30 MG TOTAL) BY MOUTH DAILY. Refills: 2    latanoprost (XALATAN) 0.005 % ophthalmic solution Place 1 drop into both eyes at bedtime. Qty: 2.5 mL, Refills: 12    levothyroxine (SYNTHROID, LEVOTHROID) 137 MCG tablet Take 1 tablet (137 mcg total) by mouth daily before breakfast. Qty: 30 tablet, Refills: 2    Maltodextrin-Xanthan Gum (RESOURCE THICKENUP CLEAR) POWD Take 120 g by mouth as needed (use to thicken liquids.). Qty: 20 Can, Refills: 0    Multiple Vitamins-Minerals (MULTIVITAMIN PO) Take 1 tablet by mouth daily.    !! Nutritional Supplements (FEEDING SUPPLEMENT, OSMOLITE 1.5 CAL,) LIQD Place 1,000 mLs into feeding tube daily. Qty: 1000 mL, Refills: 3    ondansetron (ZOFRAN) 4 MG tablet Take 1 tablet (4 mg total) by mouth every 8 (eight) hours as needed for nausea or vomiting. Qty: 20 tablet, Refills: 0    pantoprazole (PROTONIX) 40 MG tablet TAKE 1 TABLET BY MOUTH TWICE A DAY Qty: 60 tablet, Refills: 5    Polyethyl Glycol-Propyl Glycol 0.4-0.3 % SOLN Place 1 drop into both eyes 4 (four) times daily.     polyethylene glycol powder (GLYCOLAX/MIRALAX) powder MIX 17GRAMS IN 8 OUNCES OF LIQUID AND DRINK TWICE DAILY AS NEEDED Qty: 3162 g, Refills: 1    RESTASIS 0.05 % ophthalmic emulsion USE 1 DROP INTO BOTH EYES TWICE DAILY Qty: 60 mL, Refills: 0    sertraline (ZOLOFT) 100 MG tablet Take one tablet by mouth once daily for depression Qty: 90 tablet, Refills: 1    simethicone (MYLICON) 80 MG chewable tablet Chew 80 mg by mouth 4 (four) times daily as needed for flatulence.    traMADol (ULTRAM) 50 MG tablet Take 50 mg by mouth daily as needed for moderate pain.     Water For Irrigation, Sterile (FREE WATER) SOLN Place 60 mLs into feeding tube every 6 (six) hours.    NONFORMULARY OR COMPOUNDED  ITEM Apply 1-2 g topically 4 (four) times daily. Peripheral Neuropathy cream: Bupivacaine 1% Doxepin 3% Gabapentin 6% Ibuprofen 3%  Pentoxifylline 3% Qty: 120 each, Refills: 2     !! - Potential duplicate medications found. Please discuss with provider.    STOP taking these medications     apixaban (ELIQUIS) 2.5 MG TABS tablet      sodium chloride 1 g tablet             Outstanding Labs/Studies     Duration of Discharge Encounter   Greater than 30 minutes including physician time.  Signed, Little Ishikawa NP 02/02/2016, 3:40 PM

## 2016-02-02 NOTE — Progress Notes (Signed)
Patient Name: Shannon [name] Date of Encounter: 02/02/2016  Primary Cardiologist: Dr. Rhona Raider Problem List     Principal Problem:   Acute respiratory failure with hypoxia [facility]) Active Problems:   Gastric outlet obstruction   Bradycardia   Asthma with allergic rhinitis   Acute on chronic diastolic heart failure (HCC)   Hypothyroidism due to medication   Atrial flutter (HCC)   Chronic diastolic CHF (congestive heart failure) (HCC)   Acute renal insufficiency   Acute deep vein thrombosis (DVT) of left lower extremity (HCC)   Respiratory alkalosis   Warfarin anticoagulation     Subjective   Reports shortness of breath is improving today. Still feels some shortness of breath, especially with exertion. No chest pain.   Inpatient Medications    . acetaminophen  650 mg Oral BID  . amiodarone  200 mg Oral BID  . amLODipine  10 mg Oral Daily  . atorvastatin  10 mg Oral q1800  . B-complex with vitamin C  1 tablet Oral Daily  . calcium-vitamin D  1 tablet Oral Q breakfast  . chlorhexidine  15 mL Mouth/Throat BID  . cycloSPORINE  1 drop Both Eyes BID  . erythromycin   Both Eyes Q8H  . feeding supplement  1 Container Oral Daily  . feeding supplement (OSMOLITE 1.5 CAL)  1,000 mL Per Tube Q24H  . fluticasone  1 spray Each Nare BID  . free water  60 mL Per Tube Q6H  . gabapentin  300 mg Oral TID  . hydrALAZINE  50 mg Oral TID  . insulin aspart  0-15 Units Subcutaneous Q4H  . ipratropium-albuterol  3 mL Nebulization TID  . isosorbide mononitrate  30 mg Oral Daily  . latanoprost  1 drop Both Eyes QHS  . levothyroxine  137 mcg Oral QAC breakfast  . mometasone-formoterol  2 puff Inhalation BID  . pantoprazole  40 mg Oral BID  . polyethylene glycol  17 g Oral BID  . polyvinyl alcohol  1 drop Both Eyes QID  . predniSONE  40 mg Oral Q breakfast   Followed by  . [START ON 02/04/2016] predniSONE  30 mg Oral Q breakfast   Followed by  . [START ON 02/06/2016] predniSONE  20 mg  Oral Q breakfast   Followed by  . [START ON 02/08/2016] predniSONE  10 mg Oral Q breakfast  . sertraline  100 mg Oral QHS  . sodium chloride flush  3 mL Intravenous Q12H  . sodium chloride flush  3 mL Intravenous Q12H  . warfarin  5 mg Oral ONCE-1800  . warfarin   Does not apply Once  . Warfarin - Pharmacist Dosing Inpatient   Does not apply q1800    Vital Signs    Vitals:   02/02/16 0436 02/02/16 0725 02/02/16 0934 02/02/16 1001  BP: (!) 164/73 (!) 157/79  140/62  Pulse:  66    Resp: 15 15    Temp: 98.6 F (37 C) 98.6 F (37 C)    TempSrc: Oral Oral    SpO2: 100% 100% 98%   Weight: 131 lb 14.4 oz (59.8 kg)     Height:        Intake/Output Summary (Last 24 hours) at 02/02/16 1114 Last data filed at 02/02/16 0648  Gross per 24 hour  Intake           1356.5 ml  Output             1650 ml  Net           -  293.5 ml   Filed Weights   01/31/16 0500 02/01/16 0503 02/02/16 0436  Weight: 136 lb 6.4 oz (61.9 kg) 133 lb 14.4 oz (60.7 kg) 131 lb 14.4 oz (59.8 kg)    Physical Exam   GEN: Elderly female in no acute distress.  HEENT: Grossly normal, poor dentition  Neck: Supple, no JVD, carotid bruits, or masses. Cardiac: RRR, no murmurs, rubs, or gallops. No clubbing, cyanosis, edema.  Radials/DP/PT 2+ and equal bilaterally.  Respiratory:  Respirations regular and labored. Clear to ausculation.  GI: Soft, nontender, nondistended, BS + x 4. MS: no deformity or atrophy. Skin: warm and dry, no rash. Neuro:  Strength and sensation are intact. Psych: AAOx3.  Normal affect.  Labs    CBC  Recent Labs  01/31/16 0418 02/01/16 0308  WBC 15.5* 14.3*  HGB 9.1* 9.2*  HCT 27.1* 27.6*  MCV 86.9 86.8  PLT 342 409   Basic Metabolic Panel  Recent Labs  01/31/16 0418 02/01/16 0308  NA 134* 134*  K 4.3 4.5  CL 102 104  CO2 22 23  GLUCOSE 162* 151*  BUN 47* 47*  CREATININE 1.34* 1.41*  CALCIUM 9.7 9.6    Telemetry    NSR with occasional PAF which is rate  controlled  Radiology    No results found.    Cardiac Studies   Transthoracic Echocardiography 01/27/16 Study Conclusions  - Left ventricle: The cavity size was normal. Wall thickness was normal. Systolic function was normal. The estimated ejection fraction was in the range of 55% to 60%. Wall motion was normal; there were no regional wall motion abnormalities. Doppler parameters are consistent with abnormal left ventricular relaxation (grade 1 diastolic dysfunction). - Aortic valve: There was mild regurgitation. - Mitral valve: There was mild regurgitation. - Left atrium: The atrium was moderately dilated.  Impressions:  - Normal LV systolic function; grade 1 diastolic dysfunction; mild AI; mild MR; moderate LAE.   Patient Profile     Shannon [name] is a 82 year old female with a past medical history of PAF, hypothyroidism, DM, carotid artery diease and nonobstructive CAD. She presented to our office on 01/24/16 in Aflutter RVR and extremely dyspneic. Required Bipap and IV diuresis. She was found to have a LLE DVT despite Eliquis. It was determined she would require Coumadin Rx long term. Her respirator failure was felt to be multifactorial- secondary to suspected PE as well as acute diastolic CHF and acute bronchitis.  Assessment & Plan    1. Atrial flutter with RVR: Resolved, in NSR. On po Amiodarone 200 mg bid now, and rate controlled. Will need to continue for 1 month and titrate down as an outpatient if maintaining NSR.   This patients CHA2DS2-VASc Score and unadjusted Ischemic Stroke Rate (% per year) is equal to 6.   Above score calculated as 1 point each if present [CHF, HTN, DM, Vascular=MI/PAD/Aortic Plaque, Age if 65-74, or Female], 2 points each if present [Age > 75, or Stroke/TIA/TE]  INR today is therapeutic at 2.18, will stop heparin bridge. Will need outpatient follow up in coumadin clinic. PT evaluation on 10/30 recommending home health PT.    2. Acute on chronic diastolic CHF: stable, still with some dyspnea, but she takes deep gasps of air as she talks, also has some underlying COPD. Not on a diuretic currently.   3. LLE DVT: on Coumadin. Stable.   4. Acute renal insufficiency: Stable.   Anson Crofts PGY2 IM Resident [phone] 02/02/2016, 11:14 AM  Patient seen and examined. Agree with assessment and plan. Maintaining NSR. C/o discomfort in region on TMJ. Breathing better but still with mild shortness of breath. No wheezing. INR therapeutic.  Will dc heparin. Pt does not live alone and lives with daughter.  Probably can dc later today. Plan to keep on amiodarone 200 mg bid for 2 weeks, then if maintaining sinus rhythm decrease to 200 mg as outpatient.   Troy Sine, MD, [place] 02/02/2016 12:22 PM

## 2016-02-02 NOTE — Progress Notes (Signed)
Occupational Therapy Treatment Patient Details Name: Shannon [name] MRN: [id number] DOB: March 07, 1924 Today's Date: 02/02/2016    History of present illness Shannon [name] is a 82 y.o. female with a history A-fib, hypothyroidism,bradycardia, non-obstructive coronary artery disease, DM, hypertension, cerebro-vascular disease and COPD/ashma. Pt admitted with Patient comes in today extremely short of breath and found to have Atrial flutter, acute on chronic DHF, acute exacerbation of asthma, and essential HTN   OT comments  Pt still at min guard assist to min assist level for toileting and close supervision for grooming tasks in standing.  Dyspnea 3/4 with activity but sats at 100% on room air.  Pt also voicing pain in the left jaw as well, and she states that nursing is aware of this.  Will continue to follow for acute care OT services.    Follow Up Recommendations  Home health OT;Supervision/Assistance - 24 hour    Equipment Recommendations  None recommended by OT       Precautions / Restrictions Precautions Precautions: Fall Precaution Comments: GJ tube Restrictions Weight Bearing Restrictions: No       Mobility Bed Mobility               General bed mobility comments: pt received in chair  Transfers Overall transfer level: Needs assistance Equipment used: Rolling walker (2 wheeled) Transfers: Sit to/from Stand Sit to Stand: Min assist Stand pivot transfers: Min assist       General transfer comment: stood from recliner multiple times with supervision and increased time    Balance Overall balance assessment: Needs assistance Sitting-balance support: No upper extremity supported Sitting balance-Leahy Scale: Good     Standing balance support: No upper extremity supported Standing balance-Leahy Scale: Fair Standing balance comment: Stood at the sink for grooming tasks with unilateral UE support at times but no support when applying toothpaste to toothbrush.                      ADL Overall ADL's : Needs assistance/impaired     Grooming: Wash/dry hands;Oral care;Standing;Supervision/safety                   Toilet Transfer: Minimal assistance;Stand-pivot;RW;BSC   Toileting- Clothing Manipulation and Hygiene: Minimal assistance;Sit to/from stand       Functional mobility during ADLs: Min guard;Rolling walker General ADL Comments: Pt with dyspnea 3/4 with activity but Oxygen sats remained at 100% during session.                  Cognition   Behavior During Therapy: WFL for tasks assessed/performed Overall Cognitive Status: Within Functional Limits for tasks assessed                                    Pertinent Vitals/ Pain       Pain Assessment: Faces Faces Pain Scale: Hurts little more Pain Location: jaw Pain Descriptors / Indicators: Nagging;Discomfort Pain Intervention(s): Monitored during session  Home Living Family/patient expects to be discharged to:: Private residence Living Arrangements: Children Available Help at Discharge: Family;Available 24 hours/day Type of Home: House Home Access: Ramped entrance     Home Layout: One level     Bathroom Shower/Tub: Tub/shower unit         Home Equipment: Environmental consultant - 4 wheels;Cane - single point;Shower seat;Hand held Careers information officer - 2 wheels          Prior Functioning/Environment Level of  Independence: Needs assistance  Gait / Transfers Assistance Needed: Uses rollator  ADL's / Homemaking Assistance Needed: family assists with tub tranfers and basic ADLs prn       Frequency  Min 2X/week        Progress Toward Goals  OT Goals(current goals can now be found in the care plan section)  Progress towards OT goals: Progressing toward goals  Acute Rehab OT Goals Patient Stated Goal: to go home  Plan      Co-evaluation                 End of Session Equipment Utilized During Treatment: Rolling walker   Activity Tolerance Patient  tolerated treatment well   Patient Left in chair;with call bell/phone within reach   Nurse Communication Mobility status        Time: 1610-9604 OT Time Calculation (min): 26 min  Charges: OT General Charges $OT Visit: 1 Procedure OT Treatments $Self Care/Home Management : 23-37 mins  Ariel Dimitri OTR/L 02/02/2016, 4:35 PM

## 2016-02-02 NOTE — Progress Notes (Signed)
ANTICOAGULATION CONSULT NOTE - Follow Up Consult  Pharmacy Consult for heparin and warfarin Indication: DVT, h/o a-fib  No Known Allergies  Patient Measurements: Height: 5\' 6"  (167.6 cm) Weight: 131 lb 14.4 oz (59.8 kg) IBW/kg (Calculated) : 59.3  Vital Signs: Temp: 98.6 F (37 C) (11/01 0725) Temp Source: Oral (11/01 0725) BP: 140/62 (11/01 1001) Pulse Rate: 66 (11/01 0725)  Labs:  Recent Labs  01/30/16 1137 01/31/16 0418 02/01/16 0308 02/02/16 0504  HGB  --  9.1* 9.2*  --   HCT  --  27.1* 27.6*  --   PLT  --  342 344  --   LABPROT  --  16.4* 21.9* 24.6*  INR  --  1.31 1.88 2.18  HEPARINUNFRC  --  0.75* 0.53  --   CREATININE 1.30* 1.34* 1.41*  --     Estimated Creatinine Clearance: 24.8 mL/min (by C-G formula based on SCr of 1.41 mg/dL (H)).  Assessment: 82 yo F with h/o a-fib, now with new DVT while on apixaban (last dose 10/24 at ~0930). Apixaban discontinued and pharmacy consulted to start warfarin with heparin bridge. Day 7 coumadin/heparin bridge with INR at goal -Heparin level was at goal on 950 units/hr 10/31.  INR 2.18 with trend up   Goal of Therapy:  Heparin level 0.3-0.7 units/ml INR 2-3 Monitor platelets by anticoagulation protocol: Yes   Plan:  -Consider discontinuing heparin -Coumadin 5mg  po x1 today -Consider discharge on coumadin 5mg  po daily  Hildred Laser, Pharm D 02/02/2016 10:48 AM

## 2016-02-02 NOTE — Progress Notes (Signed)
J Tube de clogged and flushing well.

## 2016-02-02 NOTE — Telephone Encounter (Signed)
Shannon [name] ( [facility] ) is calling to get clarification on prescriptions that was just sent into them . If Shannon [name] not there can speak with Shannon [name] .   Thanks

## 2016-02-02 NOTE — Progress Notes (Signed)
Physical Therapy Treatment Patient Details Name: Shannon [name] MRN: [id number] DOB: 09-14-1923 Today's Date: 02/02/2016    History of Present Illness Shannon [name] is a 82 y.o. female with a history A-fib, hypothyroidism,bradycardia, non-obstructive coronary artery disease, DM, hypertension, cerebro-vascular disease and COPD/ashma. Pt admitted with Patient comes in today extremely short of breath and found to have Atrial flutter, acute on chronic DHF, acute exacerbation of asthma, and essential HTN    PT Comments    Pt mobilizing well today, ambulating >500' with rollator and supervision with stable vital signs. Pt also able to increase pace today. PT will continue to follow.   Follow Up Recommendations  Home health PT;Supervision/Assistance - 24 hour     Equipment Recommendations  None recommended by PT    Recommendations for Other Services       Precautions / Restrictions Precautions Precautions: Fall Precaution Comments: GJ tube Restrictions Weight Bearing Restrictions: No    Mobility  Bed Mobility               General bed mobility comments: pt received in chair  Transfers Overall transfer level: Needs assistance Equipment used: 4-wheeled walker Transfers: Sit to/from Stand Sit to Stand: Supervision         General transfer comment: stood from recliner multiple times with supervision and increased time  Ambulation/Gait Ambulation/Gait assistance: Supervision Ambulation Distance (Feet): 500 Feet Assistive device: 4-wheeled walker Gait Pattern/deviations: Step-through pattern;Trunk flexed Gait velocity: WFL for age Gait velocity interpretation: at or above normal speed for age/gender General Gait Details: vc's for erect posture, pt did well with rollator today and walked unit multiple times demonstrating improved endurance   Stairs            Wheelchair Mobility    Modified Rankin (Stroke Patients Only)       Balance Overall balance  assessment: Needs assistance Sitting-balance support: No upper extremity supported Sitting balance-Leahy Scale: Good     Standing balance support: No upper extremity supported Standing balance-Leahy Scale: Fair Standing balance comment: able to stand without support but only in static position                    Cognition Arousal/Alertness: Awake/alert Behavior During Therapy: WFL for tasks assessed/performed Overall Cognitive Status: Within Functional Limits for tasks assessed                      Exercises General Exercises - Lower Extremity Ankle Circles/Pumps: AROM;Both;20 reps;Seated Hip Flexion/Marching: AROM;10 reps;Standing Heel Raises: AROM;10 reps;Standing Mini-Sqauts: AROM;10 reps;Standing    General Comments General comments (skin integrity, edema, etc.): VSS      Pertinent Vitals/Pain Pain Assessment: No/denies pain    Home Living                      Prior Function            PT Goals (current goals can now be found in the care plan section) Acute Rehab PT Goals Patient Stated Goal: to go home PT Goal Formulation: With patient Time For Goal Achievement: 02/11/16 Potential to Achieve Goals: Good Progress towards PT goals: Progressing toward goals    Frequency    Min 3X/week      PT Plan Current plan remains appropriate    Co-evaluation             End of Session Equipment Utilized During Treatment: Gait belt Activity Tolerance: Patient tolerated treatment well Patient left: in chair;with call bell/phone  within reach;with chair alarm set     Time: 1400-1426 PT Time Calculation (min) (ACUTE ONLY): 26 min  Charges:  $Gait Training: 23-37 mins                    G Codes:     Lyanne Co, PT  Acute Rehab Services  [phone]  Lyanne Co 02/02/2016, 3:46 PM

## 2016-02-02 NOTE — Progress Notes (Signed)
Nutrition Follow-up  DOCUMENTATION CODES:   Non-severe (moderate) malnutrition in context of chronic illness  INTERVENTION:   -Continue Boost Breeze po daily, each supplement provides 250 kcal and 9 grams of protein -Continue Osmolite 1.5 via GJ tube at 50 ml/h x 12 hours per day (600 ml/day) to provide 900 kcals, 38 gm protein, 457 ml free water daily. Provides 62% of estimated kcal needs and 50% of estimated protein needs.  NUTRITION DIAGNOSIS:   Inadequate oral intake related to altered GI function as evidenced by per patient/family report, other (see comment) (need for GJ tube and only full liquids PO).  Ongoing  GOAL:   Patient will meet greater than or equal to 90% of their needs  Progressing  MONITOR:   PO intake, TF tolerance, Skin, I & O's, Labs  REASON FOR ASSESSMENT:   Other (Comment) (New TF)    ASSESSMENT:   82 y.o. female  With PMHx of a fib, asthma, hypothyroid who was directly admitted from the cardiology office for shortness of breath. Recent diagnosis of gastric outlet obstruction.  GJ tube placed by general surgery on 6/22.   Pt sitting in recliner chair and in good spirits at time of visit, sipping on liquids. Pt reports she is tolerating liquids well and eating what she can. Noted meal completion 10-50%.   Noted j-tube unclogged by IR on 01/27/16. J-tube clogged again on 02/01/16. Pt did not receive nocturnal feeds last night secondary to clogged tube. However, RN unclogged tube just prior to RD visit (per pt, "he did such a good job"). Pt expressed feeling or relief that she will receive TF tonight. Also continues to consume [employer] once daily.   Patient is currently receiving Osmolite 1.5 via GJ tube at 50 ml/h x 12 hours per day (600 ml/day) to provide 900 kcals, 38 gm protein, 457 ml free water daily. Provides 62% of estimated kcal needs and 50% of estimated protein needs. Also receiving 60 ml free water QID.   Labs reviewed: CBGS: 100-193.    Diet Order:  Diet full liquid Room service appropriate? Yes; Fluid consistency: Nectar Thick Diet - low sodium heart healthy  Skin:  Wound (see comment) (stg 1 heels; stg 2 toe)  Last BM:  02/02/16  Height:   Ht Readings from Last 1 Encounters:  01/24/16 5\' 6"  (1.676 m)    Weight:   Wt Readings from Last 1 Encounters:  02/02/16 131 lb 14.4 oz (59.8 kg)    Ideal Body Weight:  59.1 kg  BMI:  Body mass index is 21.29 kg/m.  Estimated Nutritional Needs:   Kcal:  1450-1650  Protein:  75-90 gm  Fluid:  1.5 L  EDUCATION NEEDS:   No education needs identified at this time  Mandisa Persinger A. Jimmye Norman, RD, LDN, CDE Pager: [phone] After hours Pager: [phone]

## 2016-02-03 ENCOUNTER — Telehealth: Payer: Self-pay

## 2016-02-03 DIAGNOSIS — I10 Essential (primary) hypertension: Secondary | ICD-10-CM

## 2016-02-03 DIAGNOSIS — I482 Chronic atrial fibrillation, unspecified: Secondary | ICD-10-CM

## 2016-02-03 MED ORDER — LEVALBUTEROL TARTRATE 45 MCG/ACT IN AERO: 2.0000 | INHALATION_SPRAY | Freq: Four times a day (QID) | RESPIRATORY_TRACT | 12 refills | Status: DC | PRN

## 2016-02-03 MED ORDER — LORAZEPAM 0.5 MG PO TABS: 0.5000 mg | ORAL_TABLET | Freq: Three times a day (TID) | ORAL | 5 refills | Status: DC | PRN

## 2016-02-03 NOTE — Telephone Encounter (Signed)
Shannon [name] called to question several changes made to her mother's medication at her recent hospital visit.  1.) Patient was taken off Sodium tablet, Shannon [name] questions if she should restart?  2.) Patient's Gabapentin was reduced, ? If this should remain at reduced dose or increase back to what Dr.Carter had the patient on  3.) Patient was told albuterol causes a-fib. Patient is on albuterol inhaler and duo-neb (albuterol is one of the components). Patient was told to call her PCP for a directive on if she should proceed with albuterol. The hospital told the patient she may need to be on xopenex instead. Please advise  FYI- I scheduled patient for a hospital follow-up next Thursday 02/10/16 with Dr.Reed (you have no available appointment within the next 7-14 days).  FYI- Pending appointment with Cardiologist on 02/14/16

## 2016-02-03 NOTE — Telephone Encounter (Signed)
Noted. Recommend she stays off sodium tab until seen at hospital f/u. Continue reduced gabapentin dose as sodium level affected by gabapentin and also dosed according to kidney function. If insurance will cover, recommend switching to xopenex HFA but still needs duonebs as ordered

## 2016-02-03 NOTE — Telephone Encounter (Signed)
Patient was prescribed injection for DM management. Patient's daughter questions should she remain on this medication. Patient's blood sugar was high in the hospital yet at home its therapeutic. Blood sugar today fasting was 145.  Please advise

## 2016-02-03 NOTE — Telephone Encounter (Signed)
Pt discharged from the hospital on yesterday 02/02/16 by Jettie Booze NP.  Per Erin's discharge note, the pt is to take Amiodarone as mentioned below, and come into our office on 11/13 to see an Extender, for post-hospital follow-up.  Pt has an appt in our office with Melina Copa PA-C for 02/14/16 at 1:30 pm.  Will follow-up with pts Pharmacy accordingly with information provided.      She is rate controlled with her Afib, Amiodarone will be dosed at 200mg  BID for 2 weeks and then will decrease to 200 mg daily (if she is still holding NSR).  Signed, Arbutus Leas NP 02/02/2016, 3:40 PM   Spoke with Marjory Lies at [facility], to follow-up on clarification on discharge meds the pt was prescribed on 02/02/16.  Per Marjory Lies, he was provided clarification yesterday and understands all orders written for this pt.  Marjory Lies gracious for the call back and follow-up.

## 2016-02-03 NOTE — Telephone Encounter (Deleted)
RX faxed to [facility] @ [phone], phone number [phone] 

## 2016-02-03 NOTE — Telephone Encounter (Signed)
Detailed message left on triage voicemail for Shannon [name] with Dr.Carter's recommendations. RX sent to pharmacy for Xopenex Inhaler (albuterol inhaler removed from list)

## 2016-02-03 NOTE — Telephone Encounter (Signed)
I do not see any insulin on her d/c list. Does she mean Byetta (which is not insulin)? If so, recommend hold medication for now. She was probably Rx due to elevated blood sugars during admission. Check blood sugars at least BID and if trend >150, will need to think about adding medication. She is on prednisone which will also raise her sugars.

## 2016-02-04 ENCOUNTER — Other Ambulatory Visit: Payer: Self-pay | Admitting: Cardiology

## 2016-02-04 ENCOUNTER — Other Ambulatory Visit: Payer: Self-pay

## 2016-02-04 DIAGNOSIS — I82402 Acute embolism and thrombosis of unspecified deep veins of left lower extremity: Secondary | ICD-10-CM | POA: Diagnosis not present

## 2016-02-04 DIAGNOSIS — J9601 Acute respiratory failure with hypoxia: Secondary | ICD-10-CM | POA: Diagnosis not present

## 2016-02-04 DIAGNOSIS — I2510 Atherosclerotic heart disease of native coronary artery without angina pectoris: Secondary | ICD-10-CM | POA: Diagnosis not present

## 2016-02-04 DIAGNOSIS — I480 Paroxysmal atrial fibrillation: Secondary | ICD-10-CM | POA: Diagnosis not present

## 2016-02-04 DIAGNOSIS — J449 Chronic obstructive pulmonary disease, unspecified: Secondary | ICD-10-CM | POA: Diagnosis not present

## 2016-02-04 DIAGNOSIS — E119 Type 2 diabetes mellitus without complications: Secondary | ICD-10-CM | POA: Diagnosis not present

## 2016-02-04 DIAGNOSIS — I110 Hypertensive heart disease with heart failure: Secondary | ICD-10-CM | POA: Diagnosis not present

## 2016-02-04 DIAGNOSIS — I5032 Chronic diastolic (congestive) heart failure: Secondary | ICD-10-CM | POA: Diagnosis not present

## 2016-02-04 NOTE — Telephone Encounter (Signed)
Discussed with Mrs.Soledad Gerlach verbalized understanding of Dr.Carter's response

## 2016-02-07 ENCOUNTER — Other Ambulatory Visit: Payer: Self-pay

## 2016-02-07 ENCOUNTER — Inpatient Hospital Stay (HOSPITAL_COMMUNITY)
Admission: EM | Admit: 2016-02-07 | Discharge: 2016-02-12 | DRG: 872 | Disposition: A | Discharge status: Home / Self Care | Payer: Medicare Other | Attending: Internal Medicine | Admitting: Internal Medicine

## 2016-02-07 ENCOUNTER — Encounter (HOSPITAL_COMMUNITY): Payer: Self-pay | Admitting: *Deleted

## 2016-02-07 ENCOUNTER — Ambulatory Visit (HOSPITAL_COMMUNITY)
Admission: RE | Admit: 2016-02-07 | Discharge: 2016-02-07 | Disposition: A | Discharge status: Home / Self Care | Payer: Medicare Other | Source: Ambulatory Visit | Attending: Radiology | Admitting: Radiology

## 2016-02-07 ENCOUNTER — Other Ambulatory Visit (HOSPITAL_COMMUNITY): Payer: Self-pay | Admitting: Radiology

## 2016-02-07 ENCOUNTER — Emergency Department (HOSPITAL_COMMUNITY): Payer: Medicare Other

## 2016-02-07 DIAGNOSIS — Z9842 Cataract extraction status, left eye: Secondary | ICD-10-CM

## 2016-02-07 DIAGNOSIS — Z961 Presence of intraocular lens: Secondary | ICD-10-CM | POA: Diagnosis present

## 2016-02-07 DIAGNOSIS — R633 Feeding difficulties, unspecified: Secondary | ICD-10-CM

## 2016-02-07 DIAGNOSIS — I471 Supraventricular tachycardia: Secondary | ICD-10-CM | POA: Diagnosis present

## 2016-02-07 DIAGNOSIS — Z86718 Personal history of other venous thrombosis and embolism: Secondary | ICD-10-CM

## 2016-02-07 DIAGNOSIS — D689 Coagulation defect, unspecified: Secondary | ICD-10-CM | POA: Diagnosis present

## 2016-02-07 DIAGNOSIS — A419 Sepsis, unspecified organism: Principal | ICD-10-CM | POA: Diagnosis present

## 2016-02-07 DIAGNOSIS — Z823 Family history of stroke: Secondary | ICD-10-CM

## 2016-02-07 DIAGNOSIS — I130 Hypertensive heart and chronic kidney disease with heart failure and stage 1 through stage 4 chronic kidney disease, or unspecified chronic kidney disease: Secondary | ICD-10-CM | POA: Diagnosis present

## 2016-02-07 DIAGNOSIS — E1122 Type 2 diabetes mellitus with diabetic chronic kidney disease: Secondary | ICD-10-CM | POA: Diagnosis present

## 2016-02-07 DIAGNOSIS — F32A Depression, unspecified: Secondary | ICD-10-CM | POA: Diagnosis present

## 2016-02-07 DIAGNOSIS — R1310 Dysphagia, unspecified: Secondary | ICD-10-CM

## 2016-02-07 DIAGNOSIS — N390 Urinary tract infection, site not specified: Secondary | ICD-10-CM | POA: Diagnosis present

## 2016-02-07 DIAGNOSIS — Z6821 Body mass index (BMI) 21.0-21.9, adult: Secondary | ICD-10-CM

## 2016-02-07 DIAGNOSIS — B9620 Unspecified Escherichia coli [E. coli] as the cause of diseases classified elsewhere: Secondary | ICD-10-CM | POA: Diagnosis present

## 2016-02-07 DIAGNOSIS — N183 Chronic kidney disease, stage 3 (moderate): Secondary | ICD-10-CM | POA: Diagnosis present

## 2016-02-07 DIAGNOSIS — Z8249 Family history of ischemic heart disease and other diseases of the circulatory system: Secondary | ICD-10-CM

## 2016-02-07 DIAGNOSIS — E440 Moderate protein-calorie malnutrition: Secondary | ICD-10-CM | POA: Diagnosis present

## 2016-02-07 DIAGNOSIS — Z9049 Acquired absence of other specified parts of digestive tract: Secondary | ICD-10-CM

## 2016-02-07 DIAGNOSIS — I480 Paroxysmal atrial fibrillation: Secondary | ICD-10-CM | POA: Diagnosis present

## 2016-02-07 DIAGNOSIS — Z79899 Other long term (current) drug therapy: Secondary | ICD-10-CM

## 2016-02-07 DIAGNOSIS — R079 Chest pain, unspecified: Secondary | ICD-10-CM | POA: Diagnosis not present

## 2016-02-07 DIAGNOSIS — I10 Essential (primary) hypertension: Secondary | ICD-10-CM | POA: Diagnosis present

## 2016-02-07 DIAGNOSIS — Z431 Encounter for attention to gastrostomy: Secondary | ICD-10-CM

## 2016-02-07 DIAGNOSIS — R109 Unspecified abdominal pain: Secondary | ICD-10-CM | POA: Diagnosis not present

## 2016-02-07 DIAGNOSIS — Z9851 Tubal ligation status: Secondary | ICD-10-CM

## 2016-02-07 DIAGNOSIS — K311 Adult hypertrophic pyloric stenosis: Secondary | ICD-10-CM | POA: Diagnosis not present

## 2016-02-07 DIAGNOSIS — N179 Acute kidney failure, unspecified: Secondary | ICD-10-CM | POA: Diagnosis not present

## 2016-02-07 DIAGNOSIS — Z8673 Personal history of transient ischemic attack (TIA), and cerebral infarction without residual deficits: Secondary | ICD-10-CM

## 2016-02-07 DIAGNOSIS — D649 Anemia, unspecified: Secondary | ICD-10-CM | POA: Diagnosis present

## 2016-02-07 DIAGNOSIS — M1990 Unspecified osteoarthritis, unspecified site: Secondary | ICD-10-CM | POA: Diagnosis present

## 2016-02-07 DIAGNOSIS — E119 Type 2 diabetes mellitus without complications: Secondary | ICD-10-CM

## 2016-02-07 DIAGNOSIS — K9423 Gastrostomy malfunction: Secondary | ICD-10-CM | POA: Diagnosis present

## 2016-02-07 DIAGNOSIS — E039 Hypothyroidism, unspecified: Secondary | ICD-10-CM | POA: Diagnosis present

## 2016-02-07 DIAGNOSIS — J9601 Acute respiratory failure with hypoxia: Secondary | ICD-10-CM | POA: Diagnosis not present

## 2016-02-07 DIAGNOSIS — Z9889 Other specified postprocedural states: Secondary | ICD-10-CM

## 2016-02-07 DIAGNOSIS — Z9841 Cataract extraction status, right eye: Secondary | ICD-10-CM

## 2016-02-07 DIAGNOSIS — R748 Abnormal levels of other serum enzymes: Secondary | ICD-10-CM | POA: Diagnosis present

## 2016-02-07 DIAGNOSIS — I5032 Chronic diastolic (congestive) heart failure: Secondary | ICD-10-CM | POA: Diagnosis present

## 2016-02-07 DIAGNOSIS — K219 Gastro-esophageal reflux disease without esophagitis: Secondary | ICD-10-CM | POA: Diagnosis present

## 2016-02-07 DIAGNOSIS — Z833 Family history of diabetes mellitus: Secondary | ICD-10-CM

## 2016-02-07 DIAGNOSIS — Z7901 Long term (current) use of anticoagulants: Secondary | ICD-10-CM

## 2016-02-07 DIAGNOSIS — Z7951 Long term (current) use of inhaled steroids: Secondary | ICD-10-CM

## 2016-02-07 DIAGNOSIS — J449 Chronic obstructive pulmonary disease, unspecified: Secondary | ICD-10-CM | POA: Diagnosis present

## 2016-02-07 DIAGNOSIS — I152 Hypertension secondary to endocrine disorders: Secondary | ICD-10-CM | POA: Diagnosis present

## 2016-02-07 DIAGNOSIS — E1159 Type 2 diabetes mellitus with other circulatory complications: Secondary | ICD-10-CM | POA: Diagnosis present

## 2016-02-07 DIAGNOSIS — I482 Chronic atrial fibrillation: Secondary | ICD-10-CM | POA: Diagnosis present

## 2016-02-07 DIAGNOSIS — Z8711 Personal history of peptic ulcer disease: Secondary | ICD-10-CM

## 2016-02-07 DIAGNOSIS — F329 Major depressive disorder, single episode, unspecified: Secondary | ICD-10-CM | POA: Diagnosis present

## 2016-02-07 HISTORY — PX: IR GENERIC HISTORICAL: IMG1180011

## 2016-02-07 LAB — CBC WITH DIFFERENTIAL/PLATELET
Basophils Absolute: 0 10*3/uL (ref 0.0–0.1)
Basophils Relative: 0 %
Eosinophils Absolute: 0 10*3/uL (ref 0.0–0.7)
Eosinophils Relative: 0 %
HCT: 31 % — ABNORMAL LOW (ref 36.0–46.0)
Hemoglobin: 10.7 g/dL — ABNORMAL LOW (ref 12.0–15.0)
Lymphocytes Relative: 5 %
Lymphs Abs: 0.8 10*3/uL (ref 0.7–4.0)
MCH: 29.2 pg (ref 26.0–34.0)
MCHC: 34.5 g/dL (ref 30.0–36.0)
MCV: 84.5 fL (ref 78.0–100.0)
Monocytes Absolute: 0.9 10*3/uL (ref 0.1–1.0)
Monocytes Relative: 5 %
Neutro Abs: 14.9 10*3/uL — ABNORMAL HIGH (ref 1.7–7.7)
Neutrophils Relative %: 90 %
Platelets: 339 10*3/uL (ref 150–400)
RBC: 3.67 MIL/uL — ABNORMAL LOW (ref 3.87–5.11)
RDW: 17.4 % — ABNORMAL HIGH (ref 11.5–15.5)
WBC: 16.6 10*3/uL — ABNORMAL HIGH (ref 4.0–10.5)

## 2016-02-07 LAB — URINE MICROSCOPIC-ADD ON

## 2016-02-07 LAB — COMPREHENSIVE METABOLIC PANEL
ALT: 41 U/L (ref 14–54)
AST: 24 U/L (ref 15–41)
Albumin: 3.8 g/dL (ref 3.5–5.0)
Alkaline Phosphatase: 59 U/L (ref 38–126)
Anion gap: 13 (ref 5–15)
BUN: 81 mg/dL — ABNORMAL HIGH (ref 6–20)
CO2: 22 mmol/L (ref 22–32)
Calcium: 9.8 mg/dL (ref 8.9–10.3)
Chloride: 97 mmol/L — ABNORMAL LOW (ref 101–111)
Creatinine, Ser: 1.97 mg/dL — ABNORMAL HIGH (ref 0.44–1.00)
GFR calc Af Amer: 25 mL/min — ABNORMAL LOW (ref >60)
GFR calc non Af Amer: 21 mL/min — ABNORMAL LOW (ref >60)
Glucose, Bld: 135 mg/dL — ABNORMAL HIGH (ref 65–99)
Potassium: 4.9 mmol/L (ref 3.5–5.1)
Sodium: 132 mmol/L — ABNORMAL LOW (ref 135–145)
Total Bilirubin: 0.5 mg/dL (ref 0.3–1.2)
Total Protein: 7.5 g/dL (ref 6.5–8.1)

## 2016-02-07 LAB — URINALYSIS, ROUTINE W REFLEX MICROSCOPIC
Bilirubin Urine: NEGATIVE
Glucose, UA: NEGATIVE mg/dL
Hgb urine dipstick: NEGATIVE
Ketones, ur: NEGATIVE mg/dL
Nitrite: POSITIVE — AB
Protein, ur: 30 mg/dL — AB
Specific Gravity, Urine: 1.013 (ref 1.005–1.030)
pH: 8.5 — ABNORMAL HIGH (ref 5.0–8.0)

## 2016-02-07 LAB — BRAIN NATRIURETIC PEPTIDE: B Natriuretic Peptide: 64.9 pg/mL (ref 0.0–100.0)

## 2016-02-07 LAB — I-STAT TROPONIN, ED: Troponin i, poc: 0.03 ng/mL (ref 0.00–0.08)

## 2016-02-07 LAB — I-STAT CG4 LACTIC ACID, ED: Lactic Acid, Venous: 1.77 mmol/L (ref 0.5–1.9)

## 2016-02-07 MED ORDER — DEXTROSE 5 % IV SOLN
2.0000 g | Freq: Once | INTRAVENOUS | Status: AC
  Administered 2016-02-08: 2 g via INTRAVENOUS
  Filled 2016-02-07: qty 2

## 2016-02-07 MED ORDER — SODIUM CHLORIDE 0.9 % IV BOLUS (SEPSIS)
1000.0000 mL | Freq: Once | INTRAVENOUS | Status: AC
  Administered 2016-02-08: 1000 mL via INTRAVENOUS

## 2016-02-07 MED ORDER — SILVER NITRATE-POT NITRATE 75-25 % EX MISC
CUTANEOUS | Status: AC
  Filled 2016-02-07: qty 1

## 2016-02-07 MED ORDER — SODIUM CHLORIDE 0.9 % IV SOLN
1000.0000 mL | INTRAVENOUS | Status: DC
  Administered 2016-02-08: 1000 mL via INTRAVENOUS

## 2016-02-07 NOTE — Procedures (Signed)
Patient came in with complaint if red, painful insertion site of GJ tube.  Patient's daughter was concerned that the tube had become too tight.  The skin did appear slightly red and moist. There was minimal granuloma tissue but the patient appeared very affected by it.  Rowe Robert  PA, inspected the skin site as well and applied silver nitrate to the granuloma tissue.  The patient and her daughter were advised to apply a barrier cream or vasoline and a single layer of guaze to protect the skin under the bumper.  The patient and daughter understood the instructions and wiill contact us if there are any further concerns

## 2016-02-07 NOTE — ED Triage Notes (Signed)
Pt BIB EMS from home. Seen at [facility] today for issue with feeding tube, and d/c'd to home. Per EMS, family called 911 after being home for a few hours when the pt had an episode of emesis. To EMS on scene, pt also reported episode of CP lasting approx 91min before resolving. No current CP, or additional emesis, per EMS. Pt received 324mg  ASA from EMS.

## 2016-02-07 NOTE — ED Notes (Signed)
Patient transported to X-ray 

## 2016-02-07 NOTE — ED Provider Notes (Signed)
[facility] DEPT Provider Note   CSN: [id number] Arrival date & time: 02/07/16  2043     History   Chief Complaint Chief Complaint  Patient presents with  . Chest Pain  . Emesis    HPI Shannon [name] is a 82 y.o. female with history of CHF, COPD, hypertension, diabetes, gastric outlet obstruction with feeding tube in place, atrial fibrillation on Coumadin who presents with chest pain, shortness of breath and one episode of emesis. Symptoms began this evening prior to arrival. Patient was seen and Elvina Sidle earlier today for feeding tube problem that was resolved and discharged home. Patient reports a left-sided chest pressure that she rates as a 6/10. She has some shortness of breath at baseline, however she feels that it is worse today and worse with exertion. She is not on oxygen at home. Patient denies any new abdominal pain, current nausea, urinary symptoms. Patient was given aspirin 324mg  en route. Patient also reports a headache behind her eyes and ears due to dental pain over the past week. Patient is scheduled to see the dentist tomorrow.  HPI  Past Medical History:  Diagnosis Date  . Anemia    previous blood transfusions  . Arthritis    "all over"  . Asthma   . Bradycardia    topped BB and amiodarone decreased- junctional rhythm   . CHF (congestive heart failure) ([place])   . Complication of blood transfusion    "got the wrong blood type at [facility] in ~ 2015; no adverse reaction that we are aware of"/daughter, [name] (01/27/2016)  . COPD (chronic obstructive pulmonary disease) ([place])   . Depression    "light case"  . DVT (deep venous thrombosis) ([place]) 01/2016   LLE  . Gastric stenosis   . GERD (gastroesophageal reflux disease)   . History of blood transfusion    "several" (01/27/2016)  . History of stomach ulcers   . Hyperlipidemia   . Hypertension   . Hypothyroidism   . Paraesophageal hernia   . Perforated gastric ulcer ([place])   . Pneumonia ?2017 X 1  . Renal  insufficiency   . Seasonal allergies   . SIADH (syndrome of inappropriate ADH production) ([place])    [name] 01/10/2015  . Small bowel obstruction    "I don't know how many" (01/11/2015)  . Stroke ([place])    "light one"  . Type II diabetes mellitus ([place])    "related to prednisone use  for > 20 yr; once predinose stopped; no more DM RX" (01/27/2016)  . Ventral hernia with bowel obstruction     Patient Active Problem List   Diagnosis Date Noted  . Sepsis ([place]) 02/08/2016  . Warfarin anticoagulation   . Chronic seasonal allergic rhinitis   . Sinus pain   . Acute renal insufficiency 01/26/2016  . Acute deep vein thrombosis (DVT) of left lower extremity ([place])   . Acute respiratory failure with hypoxia ([place])   . Respiratory alkalosis   . Atrial flutter ([place]) 01/24/2016  . Chronic diastolic CHF (congestive heart failure) ([place]) 01/24/2016  . Infection 01/24/2016  . Bilateral leg edema 12/29/2015  . Hypothyroidism due to medication   . Weakness   . Pyloric stenosis   . Pressure ulcer 09/15/2015  . Hyperkalemia 09/12/2015  . Acute osteomyelitis of toe of right foot ([place])   . PVD (peripheral vascular disease) ([place])   . Adjustment disorder with anxiety 08/29/2015  . Diabetic ulcer of toe of right foot associated with type 2 diabetes mellitus, with  necrosis of muscle ([place])   . Diverticulitis of large intestine without perforation or abscess without bleeding   . Colitis 08/26/2015  . Acute on chronic diastolic heart failure ([place]) 08/02/2015  . Esophageal candidiasis ([place])   . Dyspnea   . SOB (shortness of breath)   . Acute renal failure superimposed on stage 3 chronic kidney disease ([place])   . Encounter for long-term (current) use of other high-risk medications   . Respiratory distress 07/06/2015  . Dysphagia 07/05/2015  . Asthma with allergic rhinitis 06/04/2015  . Hypertensive heart disease 03/18/2015  . Malnutrition of moderate degree 01/28/2015  . Syncope 01/27/2015  . Fever,  unspecified 01/22/2015  . Bradycardia 01/21/2015  . Hypotension 01/21/2015  . Palliative care encounter   . DNR (do not resuscitate) discussion   . Protein-calorie malnutrition, severe 01/12/2015  . Elevated troponin 01/12/2015  . HTN (hypertension), uncontrolled 01/12/2015  . AP (abdominal pain) 01/10/2015  . SBO (small bowel obstruction) 01/10/2015  . Cerebrovascular disease 07/14/2014  . Diabetic polyneuropathy associated with type 2 diabetes mellitus ([place]) 07/14/2014  . Hyperlipidemia 07/14/2014  . Gastric outlet obstruction 05/21/2014  . Unstable gait 04/29/2014  . Hyponatremia 03/23/2014  . Acute encephalopathy 03/22/2014  . Slurred speech   . Gastroparesis   . Slow transit constipation 02/03/2014  . Carotid stenosis 02/03/2014  . Demand ischemia ([place]) 01/21/2014  . Gastric bezoar 01/20/2014  . Anemia of chronic disease 01/18/2014  . CKD (chronic kidney disease) stage 3, GFR 30-59 ml/min 01/18/2014  . Depression 09/29/2013  . Essential hypertension, benign 09/22/2013  . GERD (gastroesophageal reflux disease) 09/22/2013  . Dyslipidemia 09/22/2013  . DM (diabetes mellitus) type II uncontrolled, periph vascular disorder ([place]) 09/22/2013  . A-fib ([place]) 09/22/2013  . CVA (cerebral vascular accident) ([place]) 09/22/2013  . Diabetic neuropathy ([place]) 09/22/2013  . Hypothyroidism 09/22/2013  . Chronic gastrojejunal ulcer with perforation ([place]) 09/07/2013  . Diabetes ([place]) 09/07/2013  . Accelerated hypertension 09/07/2013  . Systemic lupus erythematosus (SLE) inhibitor 09/07/2013  . Other specified abnormal immunological findings in serum 09/07/2013    Past Surgical History:  Procedure Laterality Date  . CATARACT EXTRACTION W/ INTRAOCULAR LENS  IMPLANT, BILATERAL    . CHOLECYSTECTOMY OPEN    . COLECTOMY    . ESOPHAGOGASTRODUODENOSCOPY N/A 01/19/2014   Procedure: ESOPHAGOGASTRODUODENOSCOPY (EGD);  Surgeon: Irene Shipper, MD;  Location: [facility];  Service: Endoscopy;   Laterality: N/A;  . ESOPHAGOGASTRODUODENOSCOPY N/A 01/20/2014   Procedure: ESOPHAGOGASTRODUODENOSCOPY (EGD);  Surgeon: Irene Shipper, MD;  Location: [facility];  Service: Endoscopy;  Laterality: N/A;  . ESOPHAGOGASTRODUODENOSCOPY N/A 03/19/2014   Procedure: ESOPHAGOGASTRODUODENOSCOPY (EGD);  Surgeon: Milus Banister, MD;  Location: [facility];  Service: Endoscopy;  Laterality: N/A;  . ESOPHAGOGASTRODUODENOSCOPY N/A 07/08/2015   Procedure: ESOPHAGOGASTRODUODENOSCOPY (EGD);  Surgeon: Doran Stabler, MD;  Location[facility];  Service: Endoscopy;  Laterality: N/A;  . ESOPHAGOGASTRODUODENOSCOPY (EGD) WITH PROPOFOL N/A 09/15/2015   Procedure: ESOPHAGOGASTRODUODENOSCOPY (EGD) WITH PROPOFOL;  Surgeon: Manus Gunning, MD;  Location: [facility] ENDOSCOPY;  Service: Gastroenterology;  Laterality: N/A;  . GASTROJEJUNOSTOMY     hx/notes 01/10/2015  . GASTROJEJUNOSTOMY N/A 09/23/2015   Procedure: OPEN GASTROJEJUNOSTOMY TUBE PLACEMENT;  Surgeon: Arta Bruce Kinsinger, MD;  Location: [facility] ORS;  Service: General;  Laterality: N/A;  . GLAUCOMA SURGERY Bilateral   . HERNIA REPAIR  2015  . IR GENERIC HISTORICAL  01/07/2016   IR GJ TUBE CHANGE 01/07/2016 Jacqulynn Cadet, MD [facility]-INTERV RAD  . IR GENERIC HISTORICAL  01/27/2016   IR MECH  REMOV OBSTRUC MAT ANY COLON TUBE W/FLUORO 01/27/2016 Markus Daft, MD [facility]INTER[facility] RAD  . IR GENERIC HISTORICAL  02/07/2016   IR PATIENT EVAL TECH 0-60 MINS Darrell K Allred, PA-C [facility]-INTERV RAD  . LAPAROTOMY N/A 01/20/2015   Procedure: EXPLORATORY LAPAROTOMY;  Surgeon: Coralie Keens, MD;  Location: [facility];  Service: General;  Laterality: N/A;  . LYSIS OF ADHESION N/A 01/20/2015   Procedure: LYSIS OF ADHESIONS < 1 HOUR;  Surgeon: Coralie Keens, MD;  Location: [facility];  Service: General;  Laterality: N/A;  . TONSILLECTOMY    . TUBAL LIGATION    . VENTRAL HERNIA REPAIR  2015   incarcerated ventral hernia ([facility] 09/2013)/notes 01/10/2015    OB History    No data available        Home Medications    Prior to Admission medications   Medication Sig Start Date End Date Taking? Authorizing Provider  acetaminophen (TYLENOL) 325 MG tablet Take 650 mg by mouth 2 (two) times daily as needed for mild pain.    Yes Historical Provider, MD  ADVAIR DISKUS 250-50 MCG/DOSE AEPB INHALE 1 PUFF INTO THE LUNGS 2 (TWO) TIMES DAILY AS NEEDED FOR SHORTNESS OF BREATH 12/01/14  Yes Historical Provider, MD  AMINO ACIDS-PROTEIN HYDROLYS PO Take 30 mLs by mouth 2 (two) times daily.   Yes Historical Provider, MD  amiodarone (PACERONE) 200 MG tablet Take 1 tablet (200 mg total) by mouth 2 (two) times daily. 02/02/16 02/16/16 Yes Arbutus Leas, NP  amLODipine (NORVASC) 10 MG tablet TAKE 1 TABLET BY MOUTH DAILY FOR HYPERTENSION Patient taking differently: Take 10 mg by mouth once a day for hypertension 11/29/15  Yes Gildardo Cranker, DO  atorvastatin (LIPITOR) 10 MG tablet Take 10 mg by mouth daily.   Yes Historical Provider, MD  b complex vitamins tablet Take 1 tablet by mouth daily.    Yes Historical Provider, MD  bisacodyl (DULCOLAX) 10 MG suppository Place 1 suppository (10 mg total) rectally daily as needed for moderate constipation. 09/30/15  Yes Debbe Odea, MD  calcium-vitamin D (OSCAL WITH D) 500-200 MG-UNIT per tablet Take 1 tablet by mouth daily with breakfast.    Yes Historical Provider, MD  cetirizine (ZYRTEC) 10 MG tablet Take 10 mg by mouth daily as needed for allergies or rhinitis.   Yes Historical Provider, MD  chlorhexidine (PERIDEX) 0.12 % solution Use as directed 15 mLs in the mouth or throat 2 (two) times daily.   Yes Historical Provider, MD  diclofenac sodium (VOLTAREN) 1 % GEL Apply 2 g topically daily as needed (for pain).    Yes Historical Provider, MD  exenatide (BYETTA 5 MCG PEN) 5 MCG/0.02ML SOPN injection Inject 0.02 mLs (5 mcg total) into the skin 2 (two) times daily with a meal. 02/02/16  Yes Arbutus Leas, NP  feeding supplement (BOOST / RESOURCE BREEZE) LIQD Take 1  Container by mouth daily. Patient taking differently: Take 1 Container by mouth 2 (two) times daily between meals.  11/11/15  Yes Eugenie Filler, MD  fluticasone [facility]) 50 MCG/ACT nasal spray Place 2 sprays into both nostrils daily. 08/07/14  Yes Gildardo Cranker, DO  gabapentin (NEURONTIN) 300 MG capsule Take 1 capsule (300 mg total) by mouth 3 (three) times daily. 02/02/16  Yes Arbutus Leas, NP  hydrALAZINE (APRESOLINE) 50 MG tablet Take 1 tablet (50 mg total) by mouth 3 (three) times daily. 11/11/15  Yes Eugenie Filler, MD  ipratropium-albuterol (DUONEB) 0.5-2.5 (3) MG/3ML SOLN Take 3 mLs by nebulization every 6 (  six) hours as needed (sob). Use 3 times daily x 4 days then every 6 hours as needed. Patient taking differently: Take 3 mLs by nebulization every 6 (six) hours as needed (for shortness of breath).  11/11/15  Yes Eugenie Filler, MD  isosorbide mononitrate (IMDUR) 30 MG 24 hr tablet TAKE 1 TABLET (30 MG TOTAL) BY MOUTH DAILY. 06/22/15  Yes Historical Provider, MD  latanoprost (XALATAN) 0.005 % ophthalmic solution Place 1 drop into both eyes at bedtime. 06/02/14  Yes Mahima Bubba Camp, MD  levalbuterol [facility] HFA) 45 MCG/ACT inhaler Inhale 2 puffs into the lungs every 6 (six) hours as needed for wheezing or shortness of breath. 02/03/16  Yes Gildardo Cranker, DO  levothyroxine (SYNTHROID, LEVOTHROID) 137 MCG tablet Take 1 tablet (137 mcg total) by mouth daily before breakfast. 12/27/15  Yes Gildardo Cranker, DO  Maltodextrin-Xanthan Gum (RESOURCE THICKENUP CLEAR) POWD Take 120 g by mouth as needed (use to thicken liquids.). Patient taking differently: See admin instructions. USED TO THICKEN LIQUIDS 11/11/15  Yes Eugenie Filler, MD  Multiple Vitamin (MULTIVITAMIN WITH MINERALS) TABS tablet Take 1 tablet by mouth daily.   Yes Historical Provider, MD  NONFORMULARY OR COMPOUNDED ITEM Apply 1-2 g topically 4 (four) times daily. Peripheral Neuropathy cream: Bupivacaine 1% Doxepin 3% Gabapentin 6% Ibuprofen  3% Pentoxifylline 3% 01/20/16  Yes Edrick Kins, DPM  Nutritional Supplements (FEEDING SUPPLEMENT, OSMOLITE 1.5 CAL,) LIQD Place 1,000 mLs into feeding tube daily. Patient taking differently: Place 1,000 mLs into feeding tube daily. (at night for 12 hours) 09/30/15  Yes Debbe Odea, MD  ondansetron (ZOFRAN) 4 MG tablet Take 1 tablet (4 mg total) by mouth every 8 (eight) hours as needed for nausea or vomiting. 09/07/15  Yes [name], NP  pantoprazole (PROTONIX) 40 MG tablet TAKE 1 TABLET BY MOUTH TWICE A DAY Patient taking differently: Take 40 mg by mouth two times a day 08/11/15  Yes Gildardo Cranker, DO  Polyethyl Glycol-Propyl Glycol 0.4-0.3 % SOLN Place 1 drop into both eyes 4 (four) times daily.    Yes Historical Provider, MD  polyethylene glycol powder (GLYCOLAX/MIRALAX) powder MIX 17GRAMS IN 8 OUNCES OF LIQUID AND DRINK TWICE DAILY AS NEEDED Patient taking differently: Mix 17 grams into 8 ounces of juice and drink one to two times a day as needed 09/13/15  Yes Gildardo Cranker, DO  predniSONE (DELTASONE) 20 MG tablet Take 1 tablet (20 mg total) by mouth daily with breakfast. 02/06/16  Yes Arbutus Leas, NP  RESTASIS 0.05 % ophthalmic emulsion USE 1 DROP INTO BOTH EYES TWICE DAILY 11/01/15  Yes Estill Dooms, MD  sertraline (ZOLOFT) 100 MG tablet Take one tablet by mouth once daily for depression Patient taking differently: Take 100 mg by mouth daily. FOR DEPRESSION 09/03/15  Yes Gildardo Cranker, DO  simethicone (MYLICON) 80 MG chewable tablet Chew 80 mg by mouth 4 (four) times daily as needed for flatulence.   Yes Historical Provider, MD  traMADol (ULTRAM) 50 MG tablet Take 50 mg by mouth daily as needed for moderate pain.    Yes Historical Provider, MD  warfarin (COUMADIN) 5 MG tablet Take 1 tablet (5 mg total) by mouth one time only at 6 PM. 02/02/16  Yes Arbutus Leas, NP  amiodarone (PACERONE) 200 MG tablet Take 1 tablet (200 mg total) by mouth daily. 02/17/16   Arbutus Leas, NP  predniSONE  (DELTASONE) 10 MG tablet Take 3 tablets (30 mg total) by mouth daily with breakfast. Patient not taking:  Reported on 02/07/2016 02/04/16   Arbutus Leas, NP  predniSONE (DELTASONE) 10 MG tablet Take 1 tablet (10 mg total) by mouth daily with breakfast. 02/08/16   Arbutus Leas, NP  predniSONE (DELTASONE) 20 MG tablet Take 2 tablets (40 mg total) by mouth daily with breakfast. Patient not taking: Reported on 02/07/2016 02/03/16   Arbutus Leas, NP  Water For Irrigation, Sterile (FREE WATER) SOLN Place 60 mLs into feeding tube every 6 (six) hours. 09/30/15   Debbe Odea, MD    Family History Family History  Problem Relation Age of Onset  . Stroke Mother   . Hypertension Mother   . Diabetes Brother   . Heart attack Neg Hx     Social History Social History  Substance Use Topics  . Smoking status: Never Smoker  . Smokeless tobacco: Former Systems developer    Types: Snuff     Comment: "used snuff in my younger days"  . Alcohol use No     Allergies   Patient has no known allergies.   Review of Systems Review of Systems  Constitutional: Negative for chills and fever.  HENT: Negative for facial swelling and sore throat.   Respiratory: Positive for shortness of breath.   Cardiovascular: Positive for chest pain.  Gastrointestinal: Positive for vomiting. Negative for abdominal pain and nausea.  Genitourinary: Negative for dysuria.  Musculoskeletal: Negative for back pain.  Skin: Negative for rash and wound.  Neurological: Negative for headaches.  Psychiatric/Behavioral: The patient is not nervous/anxious.      Physical Exam Updated Vital Signs BP 124/66   Pulse 107   Temp 98.6 F (37 C) (Oral)   Resp 25   SpO2 100%   Physical Exam  Constitutional: She appears well-developed and well-nourished. No distress.  HENT:  Head: Normocephalic and atraumatic.  Mouth/Throat: Oropharynx is clear and moist. No oropharyngeal exudate.  Eyes: Conjunctivae and EOM are normal. Pupils are equal, round, and  reactive to light. Right eye exhibits no discharge. Left eye exhibits no discharge. No scleral icterus.  Neck: Normal range of motion. Neck supple. No thyromegaly present.  Cardiovascular: Normal rate, regular rhythm, normal heart sounds and intact distal pulses.  Exam reveals no gallop and no friction rub.   No murmur heard. Pulmonary/Chest: Effort normal. No stridor. No respiratory distress. She has decreased breath sounds. She has wheezes (upper lung fields). She has no rales. She exhibits no tenderness.  Abdominal: Soft. Bowel sounds are normal. She exhibits no distension. There is no tenderness. There is no rebound and no guarding.  Musculoskeletal: She exhibits no edema.  Lymphadenopathy:    She has no cervical adenopathy.  Neurological: She is alert. Coordination normal.  CN 3-12 intact; normal sensation throughout; 5/5 strength in all 4 extremities; equal bilateral grip strength   Skin: Skin is warm and dry. No rash noted. She is not diaphoretic. No pallor.  Psychiatric: She has a normal mood and affect.  Nursing note and vitals reviewed.    ED Treatments / Results  Labs (all labs ordered are listed, but only abnormal results are displayed) Labs Reviewed  CBC WITH DIFFERENTIAL/PLATELET - Abnormal; Notable for the following:       Result Value   WBC 16.6 (*)    RBC 3.67 (*)    Hemoglobin 10.7 (*)    HCT 31.0 (*)    RDW 17.4 (*)    Neutro Abs 14.9 (*)    All other components within normal limits  URINALYSIS, ROUTINE W REFLEX MICROSCOPIC (  NOT AT [facility]) - Abnormal; Notable for the following:    APPearance CLOUDY (*)    pH 8.5 (*)    Protein, ur 30 (*)    Nitrite POSITIVE (*)    Leukocytes, UA LARGE (*)    All other components within normal limits  COMPREHENSIVE METABOLIC PANEL - Abnormal; Notable for the following:    Sodium 132 (*)    Chloride 97 (*)    Glucose, Bld 135 (*)    BUN 81 (*)    Creatinine, Ser 1.97 (*)    GFR calc non Af Amer 21 (*)    GFR calc Af Amer 25  (*)    All other components within normal limits  URINE MICROSCOPIC-ADD ON - Abnormal; Notable for the following:    Squamous Epithelial / LPF 0-5 (*)    Bacteria, UA MANY (*)    All other components within normal limits  CULTURE, BLOOD (ROUTINE X 2)  CULTURE, BLOOD (ROUTINE X 2)  URINE CULTURE  BRAIN NATRIURETIC PEPTIDE  I-STAT TROPOININ, ED  I-STAT CG4 LACTIC ACID, ED    EKG  EKG Interpretation  Date/Time:  Monday February 07 2016 21:15:35 EST Ventricular Rate:  114 PR Interval:    QRS Duration: 87 QT Interval:  403 QTC Calculation: 555 R Axis:   -3 Text Interpretation:  Atrial flutter with 2 to 1 block Borderline low voltage, extremity leads Repol abnrm suggests ischemia, inferior leads Minimal ST elevation, anterolateral leads Prolonged QT interval When compared with ECG of 01/25/2016, Atrial flutter with 2 to 1 block has replaced Sinus rhythm Nonspecific T wave abnormality has improved QT has lengthened Confirmed by [facility]  MD, DAVID ([id number]) on 02/07/2016 9:28:12 PM       Radiology Dg Chest 2 View  Result Date: 02/07/2016 CLINICAL DATA:  Left-sided chest pain and pressure EXAM: CHEST  2 VIEW COMPARISON:  Chest radiograph 01/28/2016 FINDINGS: There is persistent atherosclerotic calcification in the aortic arch. Cardiomediastinal contours are otherwise normal. No pneumothorax or sizable pleural effusion. No pulmonary edema or focal airspace consolidation. IMPRESSION: No active cardiopulmonary disease. Aortic atherosclerosis. Electronically Signed   By: Ulyses Jarred M.D.   On: 02/07/2016 22:15   Ir Patient Eval Tech 0-60 Mins  Result Date: 02/07/2016 Chipper Oman     02/07/2016  3:08 PM Patient came in with complaint if red, painful insertion site of GJ tube.  Patient's daughter was concerned that the tube had become too tight.  The skin did appear slightly red and moist. There was minimal granuloma tissue but the patient appeared very affected by it.  Rowe Robert  PA, inspected  the skin site as well and applied silver nitrate to the granuloma tissue.  The patient and her daughter were advised to apply a barrier cream or vasoline and a single layer of guaze to protect the skin under the bumper.  The patient and daughter understood the instructions and wiill contact us if there are any further concerns   Procedures Procedures (including critical care time)  Medications Ordered in ED Medications  sodium chloride 0.9 % bolus 1,000 mL (1,000 mLs Intravenous New Bag/Given 02/08/16 0002)    And  sodium chloride 0.9 % bolus 1,000 mL (1,000 mLs Intravenous New Bag/Given 02/08/16 0002)  0.9 %  sodium chloride infusion (1,000 mLs Intravenous New Bag/Given 02/08/16 0002)  ceFEPIme (MAXIPIME) 2 g in dextrose 5 % 50 mL IVPB (2 g Intravenous New Bag/Given 02/08/16 0005)     Initial Impression / Assessment and Plan /  ED Course  I have reviewed the triage vital signs and the nursing notes.  Pertinent labs & imaging results that were available during my care of the patient were reviewed by me and considered in my medical decision making (see chart for details).  Clinical Course as of Feb 07 14  Molli Knock Feb 07, 2016  2124 ED EKG [DG]    Clinical Course User Index [DG] Delora Fuel, MD    Sepsis - Repeat Assessment  Performed at:    12:05am  Vitals     Blood pressure 124/66, pulse 107, temperature 98.6 F (37 C), temperature source Oral, resp. rate 25, SpO2 100 %.  Heart:     Tachycardic  Lungs:    CTA  Capillary Refill:   <2 sec  Peripheral Pulse:   Radial pulse palpable  Skin:     Normal Color  Patient with probable urosepsis. WBC shows 16.2. Stable chronic anemia, hemoglobin 10.7. CMP shows sodium 132, chloride 97, glucose 135, BUN 81, creatinine 1.97. Patient with AKI. UA shows pH 8.5, positive nitrites, 30 protein, large leukocytes, too numerous to count WBCs, many bacteria. CXR shows no active cardiopulmonary disease. Patient reporting chest pain is resolving.  Rectal temperature 100.4. Cefepime and fluid resuscitation initiated in the ED. I consulted hospitalist, Dr. Tamala Julian, who will admit the patient to telemetry for further evaluation and treatment.Patient also evaluated by Dr. Roxanne Mins who agrees with plan.   Final Clinical Impressions(s) / ED Diagnoses   Final diagnoses:  Sepsis, due to unspecified organism [facility])  Urinary tract infection without hematuria, site unspecified    New Prescriptions New Prescriptions   No medications on file     Frederica Kuster, Hershal Coria 01/31/58 [id number]    Delora Fuel, MD 92/92/44 [id number]

## 2016-02-08 ENCOUNTER — Inpatient Hospital Stay (HOSPITAL_COMMUNITY): Payer: Medicare Other

## 2016-02-08 ENCOUNTER — Encounter (HOSPITAL_COMMUNITY): Payer: Self-pay | Admitting: *Deleted

## 2016-02-08 ENCOUNTER — Ambulatory Visit (INDEPENDENT_AMBULATORY_CARE_PROVIDER_SITE_OTHER): Payer: Medicare Other | Admitting: Orthopaedic Surgery

## 2016-02-08 DIAGNOSIS — E039 Hypothyroidism, unspecified: Secondary | ICD-10-CM | POA: Diagnosis not present

## 2016-02-08 DIAGNOSIS — A4151 Sepsis due to Escherichia coli [E. coli]: Secondary | ICD-10-CM | POA: Diagnosis not present

## 2016-02-08 DIAGNOSIS — Z459 Encounter for adjustment and management of unspecified implanted device: Secondary | ICD-10-CM | POA: Diagnosis not present

## 2016-02-08 DIAGNOSIS — N3000 Acute cystitis without hematuria: Secondary | ICD-10-CM | POA: Diagnosis not present

## 2016-02-08 DIAGNOSIS — Z9841 Cataract extraction status, right eye: Secondary | ICD-10-CM | POA: Diagnosis not present

## 2016-02-08 DIAGNOSIS — M1990 Unspecified osteoarthritis, unspecified site: Secondary | ICD-10-CM | POA: Diagnosis present

## 2016-02-08 DIAGNOSIS — R1319 Other dysphagia: Secondary | ICD-10-CM | POA: Diagnosis not present

## 2016-02-08 DIAGNOSIS — N390 Urinary tract infection, site not specified: Secondary | ICD-10-CM | POA: Diagnosis present

## 2016-02-08 DIAGNOSIS — K9423 Gastrostomy malfunction: Secondary | ICD-10-CM | POA: Diagnosis present

## 2016-02-08 DIAGNOSIS — E1122 Type 2 diabetes mellitus with diabetic chronic kidney disease: Secondary | ICD-10-CM | POA: Diagnosis present

## 2016-02-08 DIAGNOSIS — A419 Sepsis, unspecified organism: Secondary | ICD-10-CM | POA: Diagnosis not present

## 2016-02-08 DIAGNOSIS — K219 Gastro-esophageal reflux disease without esophagitis: Secondary | ICD-10-CM

## 2016-02-08 DIAGNOSIS — I471 Supraventricular tachycardia: Secondary | ICD-10-CM | POA: Diagnosis present

## 2016-02-08 DIAGNOSIS — Z961 Presence of intraocular lens: Secondary | ICD-10-CM | POA: Diagnosis present

## 2016-02-08 DIAGNOSIS — K311 Adult hypertrophic pyloric stenosis: Secondary | ICD-10-CM | POA: Diagnosis present

## 2016-02-08 DIAGNOSIS — E118 Type 2 diabetes mellitus with unspecified complications: Secondary | ICD-10-CM | POA: Diagnosis not present

## 2016-02-08 DIAGNOSIS — R1310 Dysphagia, unspecified: Secondary | ICD-10-CM | POA: Diagnosis not present

## 2016-02-08 DIAGNOSIS — D689 Coagulation defect, unspecified: Secondary | ICD-10-CM | POA: Diagnosis present

## 2016-02-08 DIAGNOSIS — R1312 Dysphagia, oropharyngeal phase: Secondary | ICD-10-CM | POA: Diagnosis not present

## 2016-02-08 DIAGNOSIS — E440 Moderate protein-calorie malnutrition: Secondary | ICD-10-CM | POA: Diagnosis present

## 2016-02-08 DIAGNOSIS — I130 Hypertensive heart and chronic kidney disease with heart failure and stage 1 through stage 4 chronic kidney disease, or unspecified chronic kidney disease: Secondary | ICD-10-CM | POA: Diagnosis present

## 2016-02-08 DIAGNOSIS — N183 Chronic kidney disease, stage 3 (moderate): Secondary | ICD-10-CM | POA: Diagnosis present

## 2016-02-08 DIAGNOSIS — F329 Major depressive disorder, single episode, unspecified: Secondary | ICD-10-CM | POA: Diagnosis not present

## 2016-02-08 DIAGNOSIS — Z8711 Personal history of peptic ulcer disease: Secondary | ICD-10-CM | POA: Diagnosis not present

## 2016-02-08 DIAGNOSIS — Z9842 Cataract extraction status, left eye: Secondary | ICD-10-CM | POA: Diagnosis not present

## 2016-02-08 DIAGNOSIS — N179 Acute kidney failure, unspecified: Secondary | ICD-10-CM | POA: Diagnosis present

## 2016-02-08 DIAGNOSIS — I5032 Chronic diastolic (congestive) heart failure: Secondary | ICD-10-CM | POA: Diagnosis present

## 2016-02-08 DIAGNOSIS — I10 Essential (primary) hypertension: Secondary | ICD-10-CM | POA: Diagnosis not present

## 2016-02-08 DIAGNOSIS — I482 Chronic atrial fibrillation: Secondary | ICD-10-CM | POA: Diagnosis present

## 2016-02-08 DIAGNOSIS — R109 Unspecified abdominal pain: Secondary | ICD-10-CM | POA: Diagnosis not present

## 2016-02-08 DIAGNOSIS — Z8673 Personal history of transient ischemic attack (TIA), and cerebral infarction without residual deficits: Secondary | ICD-10-CM | POA: Diagnosis not present

## 2016-02-08 DIAGNOSIS — Z86718 Personal history of other venous thrombosis and embolism: Secondary | ICD-10-CM | POA: Diagnosis not present

## 2016-02-08 DIAGNOSIS — I480 Paroxysmal atrial fibrillation: Secondary | ICD-10-CM | POA: Diagnosis present

## 2016-02-08 DIAGNOSIS — J449 Chronic obstructive pulmonary disease, unspecified: Secondary | ICD-10-CM | POA: Diagnosis present

## 2016-02-08 DIAGNOSIS — Z7901 Long term (current) use of anticoagulants: Secondary | ICD-10-CM | POA: Diagnosis not present

## 2016-02-08 HISTORY — PX: IR GENERIC HISTORICAL: IMG1180011

## 2016-02-08 HISTORY — DX: Urinary tract infection, site not specified: N39.0

## 2016-02-08 LAB — PROTIME-INR
INR: 4.42
Prothrombin Time: 43.3 seconds — ABNORMAL HIGH (ref 11.4–15.2)

## 2016-02-08 LAB — CBC
HCT: 29.3 % — ABNORMAL LOW (ref 36.0–46.0)
Hemoglobin: 10 g/dL — ABNORMAL LOW (ref 12.0–15.0)
MCH: 28.8 pg (ref 26.0–34.0)
MCHC: 34.1 g/dL (ref 30.0–36.0)
MCV: 84.4 fL (ref 78.0–100.0)
Platelets: 305 10*3/uL (ref 150–400)
RBC: 3.47 MIL/uL — ABNORMAL LOW (ref 3.87–5.11)
RDW: 17.2 % — ABNORMAL HIGH (ref 11.5–15.5)
WBC: 16 10*3/uL — ABNORMAL HIGH (ref 4.0–10.5)

## 2016-02-08 LAB — LACTIC ACID, PLASMA
Lactic Acid, Venous: 1.9 mmol/L (ref 0.5–1.9)
Lactic Acid, Venous: 1.5 mmol/L (ref 0.5–1.9)

## 2016-02-08 LAB — GLUCOSE, CAPILLARY
Glucose-Capillary: 120 mg/dL — ABNORMAL HIGH (ref 65–99)
Glucose-Capillary: 123 mg/dL — ABNORMAL HIGH (ref 65–99)
Glucose-Capillary: 186 mg/dL — ABNORMAL HIGH (ref 65–99)
Glucose-Capillary: 260 mg/dL — ABNORMAL HIGH (ref 65–99)

## 2016-02-08 LAB — BASIC METABOLIC PANEL
Anion gap: 9 (ref 5–15)
BUN: 63 mg/dL — ABNORMAL HIGH (ref 6–20)
CO2: 22 mmol/L (ref 22–32)
Calcium: 8.9 mg/dL (ref 8.9–10.3)
Chloride: 103 mmol/L (ref 101–111)
Creatinine, Ser: 1.65 mg/dL — ABNORMAL HIGH (ref 0.44–1.00)
GFR calc Af Amer: 30 mL/min — ABNORMAL LOW (ref >60)
GFR calc non Af Amer: 26 mL/min — ABNORMAL LOW (ref >60)
Glucose, Bld: 130 mg/dL — ABNORMAL HIGH (ref 65–99)
Potassium: 5.1 mmol/L (ref 3.5–5.1)
Sodium: 134 mmol/L — ABNORMAL LOW (ref 135–145)

## 2016-02-08 LAB — TROPONIN I
Troponin I: 0.03 ng/mL (ref <0.03)
Troponin I: 0.04 ng/mL (ref <0.03)
Troponin I: 0.04 ng/mL (ref <0.03)

## 2016-02-08 LAB — APTT: aPTT: 52 seconds — ABNORMAL HIGH (ref 24–36)

## 2016-02-08 MED ORDER — TRAMADOL HCL 50 MG PO TABS
50.0000 mg | ORAL_TABLET | Freq: Every day | ORAL | Status: DC | PRN
  Administered 2016-02-12: 50 mg via ORAL
  Filled 2016-02-08: qty 1

## 2016-02-08 MED ORDER — OSMOLITE 1.5 CAL PO LIQD
1000.0000 mL | ORAL | Status: DC
  Filled 2016-02-08: qty 1000

## 2016-02-08 MED ORDER — LORATADINE 10 MG PO TABS
10.0000 mg | ORAL_TABLET | Freq: Every day | ORAL | Status: DC
  Administered 2016-02-08 – 2016-02-12 (×5): 10 mg via ORAL
  Filled 2016-02-08 (×5): qty 1

## 2016-02-08 MED ORDER — FLUTICASONE PROPIONATE 50 MCG/ACT NA SUSP
2.0000 | Freq: Every day | NASAL | Status: DC
  Administered 2016-02-08 – 2016-02-12 (×5): 2 via NASAL
  Filled 2016-02-08: qty 16

## 2016-02-08 MED ORDER — ATORVASTATIN CALCIUM 10 MG PO TABS
10.0000 mg | ORAL_TABLET | Freq: Every day | ORAL | Status: DC
  Administered 2016-02-08 – 2016-02-12 (×5): 10 mg via ORAL
  Filled 2016-02-08 (×5): qty 1

## 2016-02-08 MED ORDER — ACETAMINOPHEN 650 MG RE SUPP: 650.0000 mg | Freq: Four times a day (QID) | RECTAL | Status: DC | PRN

## 2016-02-08 MED ORDER — IOPAMIDOL (ISOVUE-M 200) INJECTION 41%
INTRAMUSCULAR | Status: AC
Start: 2016-02-08 — End: 2016-02-08
  Administered 2016-02-08: 20 mL
  Filled 2016-02-08: qty 10

## 2016-02-08 MED ORDER — DICLOFENAC SODIUM 1 % TD GEL: 2.0000 g | Freq: Every day | TRANSDERMAL | Status: DC | PRN

## 2016-02-08 MED ORDER — AMIODARONE HCL 200 MG PO TABS
200.0000 mg | ORAL_TABLET | Freq: Two times a day (BID) | ORAL | Status: DC
  Administered 2016-02-08 – 2016-02-12 (×10): 200 mg via ORAL
  Filled 2016-02-08 (×10): qty 1

## 2016-02-08 MED ORDER — ONDANSETRON HCL 4 MG PO TABS: 4.0000 mg | ORAL_TABLET | Freq: Four times a day (QID) | ORAL | Status: DC | PRN

## 2016-02-08 MED ORDER — ISOSORBIDE MONONITRATE ER 30 MG PO TB24
30.0000 mg | ORAL_TABLET | Freq: Every day | ORAL | Status: DC
  Administered 2016-02-08 – 2016-02-12 (×5): 30 mg via ORAL
  Filled 2016-02-08 (×5): qty 1

## 2016-02-08 MED ORDER — INSULIN ASPART 100 UNIT/ML ~~LOC~~ SOLN
0.0000 [IU] | Freq: Three times a day (TID) | SUBCUTANEOUS | Status: DC
  Administered 2016-02-08: 1 [IU] via SUBCUTANEOUS
  Administered 2016-02-08 – 2016-02-09 (×3): 2 [IU] via SUBCUTANEOUS
  Administered 2016-02-09 – 2016-02-10 (×2): 3 [IU] via SUBCUTANEOUS
  Administered 2016-02-10: 5 [IU] via SUBCUTANEOUS
  Administered 2016-02-10: 2 [IU] via SUBCUTANEOUS
  Administered 2016-02-11: 5 [IU] via SUBCUTANEOUS
  Administered 2016-02-11: 3 [IU] via SUBCUTANEOUS
  Administered 2016-02-11: 2 [IU] via SUBCUTANEOUS
  Administered 2016-02-12: 7 [IU] via SUBCUTANEOUS
  Administered 2016-02-12: 3 [IU] via SUBCUTANEOUS

## 2016-02-08 MED ORDER — IPRATROPIUM-ALBUTEROL 0.5-2.5 (3) MG/3ML IN SOLN: 3.0000 mL | Freq: Four times a day (QID) | RESPIRATORY_TRACT | Status: DC | PRN

## 2016-02-08 MED ORDER — CYCLOSPORINE 0.05 % OP EMUL
1.0000 [drp] | Freq: Two times a day (BID) | OPHTHALMIC | Status: DC
  Administered 2016-02-08 – 2016-02-12 (×9): 1 [drp] via OPHTHALMIC
  Filled 2016-02-08 (×10): qty 1

## 2016-02-08 MED ORDER — LEVOTHYROXINE SODIUM 137 MCG PO TABS
137.0000 ug | ORAL_TABLET | Freq: Every day | ORAL | Status: DC
  Administered 2016-02-08 – 2016-02-12 (×5): 137 ug via ORAL
  Filled 2016-02-08 (×6): qty 1

## 2016-02-08 MED ORDER — SODIUM CHLORIDE 0.9 % IV SOLN
INTRAVENOUS | Status: DC
  Administered 2016-02-08: 75 mL via INTRAVENOUS
  Administered 2016-02-09: 06:00:00 via INTRAVENOUS

## 2016-02-08 MED ORDER — B COMPLEX-C PO TABS
1.0000 | ORAL_TABLET | Freq: Every day | ORAL | Status: DC
  Administered 2016-02-08 – 2016-02-12 (×5): 1 via ORAL
  Filled 2016-02-08 (×5): qty 1

## 2016-02-08 MED ORDER — AMLODIPINE BESYLATE 10 MG PO TABS
10.0000 mg | ORAL_TABLET | Freq: Every day | ORAL | Status: DC
  Administered 2016-02-08 – 2016-02-12 (×5): 10 mg via ORAL
  Filled 2016-02-08 (×5): qty 1

## 2016-02-08 MED ORDER — BOOST / RESOURCE BREEZE PO LIQD: 1.0000 | Freq: Two times a day (BID) | ORAL | Status: DC

## 2016-02-08 MED ORDER — PREDNISONE 20 MG PO TABS
20.0000 mg | ORAL_TABLET | Freq: Every day | ORAL | Status: DC
  Administered 2016-02-08 – 2016-02-12 (×5): 20 mg via ORAL
  Filled 2016-02-08 (×5): qty 1

## 2016-02-08 MED ORDER — HYDRALAZINE HCL 50 MG PO TABS
50.0000 mg | ORAL_TABLET | Freq: Three times a day (TID) | ORAL | Status: DC
  Administered 2016-02-08 – 2016-02-12 (×14): 50 mg via ORAL
  Filled 2016-02-08 (×8): qty 1
  Filled 2016-02-08: qty 2
  Filled 2016-02-08 (×4): qty 1

## 2016-02-08 MED ORDER — ACETAMINOPHEN 325 MG PO TABS: 650.0000 mg | ORAL_TABLET | Freq: Two times a day (BID) | ORAL | Status: DC | PRN

## 2016-02-08 MED ORDER — SERTRALINE HCL 100 MG PO TABS
100.0000 mg | ORAL_TABLET | Freq: Every day | ORAL | Status: DC
  Administered 2016-02-08 – 2016-02-12 (×5): 100 mg via ORAL
  Filled 2016-02-08 (×5): qty 1

## 2016-02-08 MED ORDER — PANTOPRAZOLE SODIUM 40 MG PO TBEC
40.0000 mg | DELAYED_RELEASE_TABLET | Freq: Two times a day (BID) | ORAL | Status: DC
  Administered 2016-02-08 – 2016-02-12 (×10): 40 mg via ORAL
  Filled 2016-02-08 (×10): qty 1

## 2016-02-08 MED ORDER — RESOURCE THICKENUP CLEAR PO POWD
ORAL | Status: DC | PRN
  Filled 2016-02-08: qty 125

## 2016-02-08 MED ORDER — CHLORHEXIDINE GLUCONATE 0.12 % MT SOLN
15.0000 mL | Freq: Two times a day (BID) | OROMUCOSAL | Status: DC
  Administered 2016-02-08 – 2016-02-12 (×9): 15 mL via OROMUCOSAL
  Filled 2016-02-08 (×9): qty 15

## 2016-02-08 MED ORDER — POLYVINYL ALCOHOL 1.4 % OP SOLN
1.0000 [drp] | Freq: Four times a day (QID) | OPHTHALMIC | Status: DC
  Administered 2016-02-08 – 2016-02-12 (×17): 1 [drp] via OPHTHALMIC
  Filled 2016-02-08: qty 15

## 2016-02-08 MED ORDER — BISACODYL 10 MG RE SUPP: 10.0000 mg | Freq: Every day | RECTAL | Status: DC | PRN

## 2016-02-08 MED ORDER — OSMOLITE 1.5 CAL PO LIQD
1000.0000 mL | ORAL | Status: DC
  Administered 2016-02-08 – 2016-02-11 (×4): 1000 mL
  Filled 2016-02-08 (×6): qty 1000

## 2016-02-08 MED ORDER — ALBUTEROL SULFATE (2.5 MG/3ML) 0.083% IN NEBU: 3.0000 mL | INHALATION_SOLUTION | Freq: Four times a day (QID) | RESPIRATORY_TRACT | Status: DC | PRN

## 2016-02-08 MED ORDER — SIMETHICONE 80 MG PO CHEW: 80.0000 mg | CHEWABLE_TABLET | Freq: Four times a day (QID) | ORAL | Status: DC | PRN

## 2016-02-08 MED ORDER — MOMETASONE FURO-FORMOTEROL FUM 200-5 MCG/ACT IN AERO
2.0000 | INHALATION_SPRAY | Freq: Two times a day (BID) | RESPIRATORY_TRACT | Status: DC
  Administered 2016-02-08 – 2016-02-12 (×8): 2 via RESPIRATORY_TRACT
  Filled 2016-02-08 (×2): qty 8.8

## 2016-02-08 MED ORDER — CALCIUM CARBONATE-VITAMIN D 500-200 MG-UNIT PO TABS
1.0000 | ORAL_TABLET | Freq: Every day | ORAL | Status: DC
  Administered 2016-02-08 – 2016-02-12 (×5): 1 via ORAL
  Filled 2016-02-08 (×5): qty 1

## 2016-02-08 MED ORDER — FREE WATER
60.0000 mL | Freq: Four times a day (QID) | Status: DC
  Administered 2016-02-08 – 2016-02-12 (×16): 60 mL

## 2016-02-08 MED ORDER — IOPAMIDOL (ISOVUE-300) INJECTION 61%
INTRAVENOUS | Status: AC
  Administered 2016-02-08: 50 mL
  Filled 2016-02-08: qty 100

## 2016-02-08 MED ORDER — LATANOPROST 0.005 % OP SOLN
1.0000 [drp] | Freq: Every day | OPHTHALMIC | Status: DC
  Administered 2016-02-08 – 2016-02-11 (×4): 1 [drp] via OPHTHALMIC
  Filled 2016-02-08: qty 2.5

## 2016-02-08 MED ORDER — ACETAMINOPHEN 325 MG PO TABS: 650.0000 mg | ORAL_TABLET | Freq: Four times a day (QID) | ORAL | Status: DC | PRN

## 2016-02-08 MED ORDER — AMIODARONE HCL 200 MG PO TABS: 200.0000 mg | ORAL_TABLET | Freq: Every day | ORAL | Status: DC

## 2016-02-08 MED ORDER — ONDANSETRON HCL 4 MG/2ML IJ SOLN: 4.0000 mg | Freq: Four times a day (QID) | INTRAMUSCULAR | Status: DC | PRN

## 2016-02-08 MED ORDER — GABAPENTIN 300 MG PO CAPS
300.0000 mg | ORAL_CAPSULE | Freq: Three times a day (TID) | ORAL | Status: DC
  Administered 2016-02-08 – 2016-02-12 (×14): 300 mg via ORAL
  Filled 2016-02-08 (×14): qty 1

## 2016-02-08 MED ORDER — DEXTROSE 5 % IV SOLN
1.0000 g | INTRAVENOUS | Status: DC
  Administered 2016-02-08 – 2016-02-10 (×2): 1 g via INTRAVENOUS
  Filled 2016-02-08 (×3): qty 1

## 2016-02-08 NOTE — Procedures (Signed)
S/p GJ tube replacement  Ready for use No comp Stable Full report in PACS

## 2016-02-08 NOTE — Progress Notes (Signed)
ANTICOAGULATION CONSULT NOTE - Follow Up Consult  Pharmacy Consult for Coumadin Indication: atrial fibrillation and recent DVT  Not on File  Patient Measurements:   Heparin Dosing Weight:   Vital Signs: Temp: 98.9 F (37.2 C) (11/07 1243) Temp Source: Oral (11/07 1243) BP: 136/67 (11/07 1243) Pulse Rate: 115 (11/07 1243)  Labs:  Recent Labs  02/07/16 2118 02/08/16 0340 02/08/16 0726  HGB 10.7* 10.0*  --   HCT 31.0* 29.3*  --   PLT 339 305  --   APTT  --  52*  --   LABPROT  --  43.3*  --   INR  --  4.42*  --   CREATININE 1.97* 1.65*  --   TROPONINI  --  0.03* 0.04*    Estimated Creatinine Clearance: 21.2 mL/min (by C-G formula based on SCr of 1.65 mg/dL (H)).   Assessment: 82yo female with AFib and recent DVT on Eliquis, now on COumadin.  INR with large jump this AM.  Hg is stable, pltc wnl.  No bleeding noted.  Goal of Therapy:  INR 2-3 Monitor platelets by anticoagulation protocol: Yes   Plan:  No Coumadin today Daily INR Watch for s/s of bleeding   Gracy Bruins, PharmD [facility]

## 2016-02-08 NOTE — Care Management Note (Addendum)
Case Management Note  Patient Details  Name: Shannon [name] MRN: [id number] Date of Birth: 1923-05-20  Subjective/Objective:                   Pt readmitted with c/o of SOB and chest pain,  history significant of HTN, asthma/COPD, A. fib , CHF, DVT, CVA, gastric outlet obstruction s/p Peg, HLD, hypothyroidism, SIADH,and anemia. From home with daughter, [name] [phone]). Pt states uses walker with ambulation. Pt's DME: walker, shower chair and BSC. [facility] provided home health services( PT,RN).  Action/Plan: Return to home when medically stable. CM to f/u with disposition needs. [facility] consult in place.  Expected Discharge Date:                  Expected Discharge Plan:  [place]  In-House Referral:     Discharge planning Services  CM Consult  Post Acute Care Choice:  Resumption of Svcs/PTA Provider Choice offered to:  Patient  DME Arranged:    DME Agency:     HH Arranged:  PT, RN [place] Agency:  [place]  Status of Service:  In process, will continue to follow  If discussed at Long Length of Stay Meetings, dates discussed:    Additional Comments:  Sharin Mons, RN 02/08/2016, 12:41 PM

## 2016-02-08 NOTE — Progress Notes (Signed)
Trop.=0.03.Dr.Stoneking was called & made aware ,no further orders received.

## 2016-02-08 NOTE — Progress Notes (Signed)
Initial Nutrition Assessment  DOCUMENTATION CODES:   Non-severe (moderate) malnutrition in context of chronic illness  INTERVENTION:   -Once able to resume TF, recommend:  Initiate Osmolite 1.5via GJ tubeat 18ml/h x 12 hours per day(690ml/day) to provide 900kcals, 38gm protein, 428ml free water daily. Provides 62% of estimated kcal needs and 50% of estimated protein needs.  NUTRITION DIAGNOSIS:   Inadequate oral intake related to altered GI function as evidenced by  (G-J tube dependent).  GOAL:   Patient will meet greater than or equal to 90% of their needs  MONITOR:   PO intake, Supplement acceptance, Diet advancement, Labs, Weight trends, Skin, I & O's  REASON FOR ASSESSMENT:   Malnutrition Screening Tool    ASSESSMENT:   Shannon [name] is a 82 y.o. female with medical history significant of HTN, asthma/COPD, A. fib on apixiban, CHF, DVT, CVA, gastric outlet obstruction s/p Peg, HLD, hypothyroidism, SIADH, and  anemia; who presented with complaints of shortness of breath and chest pain.  Pt admitted with chest pain.   Pt scheduled for G-J tube exchange today.   Pt is very familiar to this RD due to multiple previous admissions. Pt either receiving nursing care or sleeping soundly at time of visit Pt with hx of mild malnutrition in the context of chronic illness, which os ongoing. Nutrition-focused physical exam completed on 01/26/16 revealed mild to moderate fat and muscle depletion; RD suspects no changes to exam at this time.   Pt with Hx of GOO. She consumes mainly liquids PO and receives nocturnal TF.   Home TF orders are as follows: Osmolite 1.5via GJ tubeat 39ml/h x 12 hours per day(623ml/day) to provide 900kcals, 38gm protein, 464ml free water daily. Provides 62% of estimated kcal needs and 50% of estimated protein needs. Also receives 60 ml free water QID.   Labs reviewed: CBGS: 120-123.   Diet Order:  DIET - DYS 1 Room service appropriate? Yes;  Fluid consistency: Thin  Skin:  Wound (see comment) (stage II rt and lt medial toe)  Last BM:  02/06/16  Height:   Ht Readings from Last 1 Encounters:  02/08/16 5\' 6"  (1.676 m)    Weight:   Wt Readings from Last 1 Encounters:  02/08/16 131 lb (59.4 kg)    Ideal Body Weight:  59.1 kg  BMI:  Body mass index is 21.14 kg/m.  Estimated Nutritional Needs:   Kcal:  1450-1650  Protein:  75-90 grams  Fluid:  1.5 L  EDUCATION NEEDS:   No education needs identified at this time  Younique Casad A. Jimmye Norman, RD, LDN, CDE Pager: [phone] After hours Pager: [phone]

## 2016-02-08 NOTE — ED Notes (Signed)
Patient transported to X-ray 

## 2016-02-08 NOTE — ED Notes (Addendum)
Confirmed from pt & family member that pt is able to take PO medications by mouth. Uses feeding tube for nutritional supplements.

## 2016-02-08 NOTE — Consult Note (Signed)
[facility] CM Inpatient Consult   02/08/2016  Shannon [name] 1924/01/27 [id number]   Referral received.  Patient was evaluated for [name] Care Management services for frequent admissions and with HX of COPD, HF, and multiple admissions.  Spoke with inpatient RNCM regarding referral and difficulty getting the daughter to consent in the past.  Came by to see the patient, and she states a brochure with contact information to be left for her daughter.  Confirmed and placed information on the windowsill.   Spoke with Maryjean Ka, daughter and explained services and brochure to review at her mother's bedside. She states she is not sure she will be at the hospital tomorrow but on Thursday.   Will plan to follow up  With the daughter.     For questions, please contact:  Charlesetta Shanks, RN BSN CCM [facility]  [phone] business mobile phone Toll free office [phone]

## 2016-02-08 NOTE — Progress Notes (Signed)
Received report from ED.  

## 2016-02-08 NOTE — Progress Notes (Signed)
Received an Critical Lab of Troponin of 0.03.  Notified the nurse assigned to the patient.

## 2016-02-08 NOTE — H&P (Signed)
History and Physical    Shannon [name] QMV:[id number] DOB: April 08, 1923 DOA: 02/07/2016  Referring MD/NP/PA: Buel Ream PA-C PCP: Kirt Boys, DO  Patient coming from: home via   Chief Complaint: Shortness of breath and chest pain  HPI: Shannon [name] is a 82 y.o. female with medical history significant of HTN, asthma/COPD, A. fib on apixiban, CHF, DVT, CVA, gastric outlet obstruction s/p Peg, HLD, hypothyroidism, SIADH, and  anemia; who presented with complaints of shortness of breath and chest pain. She had reported pain  and leaking around the peg tube which initially gave family concern for PEG tube misplacement. The patient had been seen at [facility] earlier today for her PEG tube by interventional radiology PA-C. Noted to have minimal granuloma tissue present and they applied silver nitrate to the granuloma.  She was advised about how to protect skin underneath the bumper and discharged home. After the patient at home this afternoon she reports that it felt like someone standing on her chest. She has had chest pain intermittently that comes and goes. Patient daughter  that she has been complaining of shortness of breath also for which they had been giving her treating with Advir and intermittent Xopenex inhalers at home. Associated symptoms included palpitations, nausea,  vomiting, eye irritation, neck soreness, and pain. Patient tired to lay down without relief of symptoms.  Daughter checked pulse and noted that it was elevated and called the EMS to bring her mother for further evaluation.   ED Course: Upon admission to the emergency department patient was seen to be febrile up to 100.57F, pulse 105- 116, respirations 14-26, blood pressure and O2 saturations maintained. Lab work revealed WBC 16.6, hemoglobin 10.7, sodium 132, BUN 81, creatinine 1.97, lactic acid 1.77, and troponin 0.03. UA was positive for a bacteria, large leukocytes, positive nitrites, 0-5 squamous epithelial cells, and  TNTC WBCs. Patient started on cefepime given recent hospitalization and [facility] called to admit.  Review of Systems: As per HPI otherwise 10 point review of systems negative.   Past Medical History:  Diagnosis Date  . Anemia    previous blood transfusions  . Arthritis    "all over"  . Asthma   . Bradycardia    topped BB and amiodarone decreased- junctional rhythm   . CHF (congestive heart failure) (HCC)   . Complication of blood transfusion    "got the wrong blood type at [facility] in ~ 2015; no adverse reaction that we are aware of"/daughter, [name] (01/27/2016)  . COPD (chronic obstructive pulmonary disease) (HCC)   . Depression    "light case"  . DVT (deep venous thrombosis) (HCC) 01/2016   LLE  . Gastric stenosis   . GERD (gastroesophageal reflux disease)   . History of blood transfusion    "several" (01/27/2016)  . History of stomach ulcers   . Hyperlipidemia   . Hypertension   . Hypothyroidism   . Paraesophageal hernia   . Perforated gastric ulcer (HCC)   . Pneumonia ?2017 X 1  . Renal insufficiency   . Seasonal allergies   . SIADH (syndrome of inappropriate ADH production) (HCC)    [name] 01/10/2015  . Small bowel obstruction    "I don't know how many" (01/11/2015)  . Stroke (HCC)    "light one"  . Type II diabetes mellitus (HCC)    "related to prednisone use  for > 20 yr; once predinose stopped; no more DM RX" (01/27/2016)  . Ventral hernia with bowel obstruction  Past Surgical History:  Procedure Laterality Date  . CATARACT EXTRACTION W/ INTRAOCULAR LENS  IMPLANT, BILATERAL    . CHOLECYSTECTOMY OPEN    . COLECTOMY    . ESOPHAGOGASTRODUODENOSCOPY N/A 01/19/2014   Procedure: ESOPHAGOGASTRODUODENOSCOPY (EGD);  Surgeon: Hilarie Fredrickson, MD;  Location: [facility];  Service: Endoscopy;  Laterality: N/A;  . ESOPHAGOGASTRODUODENOSCOPY N/A 01/20/2014   Procedure: ESOPHAGOGASTRODUODENOSCOPY (EGD);  Surgeon: Hilarie Fredrickson, MD;  Location: [facility]COPY;  Service:  Endoscopy;  Laterality: N/A;  . ESOPHAGOGASTRODUODENOSCOPY N/A 03/19/2014   Procedure: ESOPHAGOGASTRODUODENOSCOPY (EGD);  Surgeon: Rachael Fee, MD;  Location: [facility];  Service: Endoscopy;  Laterality: N/A;  . ESOPHAGOGASTRODUODENOSCOPY N/A 07/08/2015   Procedure: ESOPHAGOGASTRODUODENOSCOPY (EGD);  Surgeon: Sherrilyn Rist, MD;  Location: [facility] ENDOSCOPY;  Service: Endoscopy;  Laterality: N/A;  . ESOPHAGOGASTRODUODENOSCOPY (EGD) WITH PROPOFOL N/A 09/15/2015   Procedure: ESOPHAGOGASTRODUODENOSCOPY (EGD) WITH PROPOFOL;  Surgeon: Ruffin Frederick, MD;  Location: [facility] ENDOSCOPY;  Service: Gastroenterology;  Laterality: N/A;  . GASTROJEJUNOSTOMY     hx/notes 01/10/2015  . GASTROJEJUNOSTOMY N/A 09/23/2015   Procedure: OPEN GASTROJEJUNOSTOMY TUBE PLACEMENT;  Surgeon: De Blanch Kinsinger, MD;  Location: [facility] ORS;  Service: General;  Laterality: N/A;  . GLAUCOMA SURGERY Bilateral   . HERNIA REPAIR  2015  . IR GENERIC HISTORICAL  01/07/2016   IR GJ TUBE CHANGE 01/07/2016 Malachy Moan, MD [facility]  . IR GENERIC HISTORICAL  01/27/2016   IR MECH REMOV OBSTRUC MAT ANY COLON TUBE W/FLUORO 01/27/2016 Richarda Overlie, MD [facility]  . IR GENERIC HISTORICAL  02/07/2016   IR PATIENT EVAL TECH 0-60 MINS Darrell K Allred, PA-C [facility]  . LAPAROTOMY N/A 01/20/2015   Procedure: EXPLORATORY LAPAROTOMY;  Surgeon: Abigail Miyamoto, MD;  Location: [facility] OR;  Service: General;  Laterality: N/A;  . LYSIS OF ADHESION N/A 01/20/2015   Procedure: LYSIS OF ADHESIONS < 1 HOUR;  Surgeon: Abigail Miyamoto, MD;  Location: [facility] OR;  Service: General;  Laterality: N/A;  . TONSILLECTOMY    . TUBAL LIGATION    . VENTRAL HERNIA REPAIR  2015   incarcerated ventral hernia ([facility] 09/2013)/notes 01/10/2015     reports that she has never smoked. She has quit using smokeless tobacco. Her smokeless tobacco use included Snuff. She reports that she does not drink alcohol or use drugs.  No Known Allergies  Family History    Problem Relation Age of Onset  . Stroke Mother   . Hypertension Mother   . Diabetes Brother   . Heart attack Neg Hx     Prior to Admission medications   Medication Sig Start Date End Date Taking? Authorizing Provider  acetaminophen (TYLENOL) 325 MG tablet Take 650 mg by mouth 2 (two) times daily as needed for mild pain.    Yes Historical Provider, MD  ADVAIR DISKUS 250-50 MCG/DOSE AEPB INHALE 1 PUFF INTO THE LUNGS 2 (TWO) TIMES DAILY AS NEEDED FOR SHORTNESS OF BREATH 12/01/14  Yes Historical Provider, MD  AMINO ACIDS-PROTEIN HYDROLYS PO Take 30 mLs by mouth 2 (two) times daily.   Yes Historical Provider, MD  amiodarone (PACERONE) 200 MG tablet Take 1 tablet (200 mg total) by mouth 2 (two) times daily. 02/02/16 02/16/16 Yes Little Ishikawa, NP  amLODipine (NORVASC) 10 MG tablet TAKE 1 TABLET BY MOUTH DAILY FOR HYPERTENSION Patient taking differently: Take 10 mg by mouth once a day for hypertension 11/29/15  Yes Kirt Boys, DO  atorvastatin (LIPITOR) 10 MG tablet Take 10 mg by mouth daily.   Yes Historical Provider,  MD  b complex vitamins tablet Take 1 tablet by mouth daily.    Yes Historical Provider, MD  bisacodyl (DULCOLAX) 10 MG suppository Place 1 suppository (10 mg total) rectally daily as needed for moderate constipation. 09/30/15  Yes Calvert Cantor, MD  calcium-vitamin D (OSCAL WITH D) 500-200 MG-UNIT per tablet Take 1 tablet by mouth daily with breakfast.    Yes Historical Provider, MD  cetirizine (ZYRTEC) 10 MG tablet Take 10 mg by mouth daily as needed for allergies or rhinitis.   Yes Historical Provider, MD  chlorhexidine (PERIDEX) 0.12 % solution Use as directed 15 mLs in the mouth or throat 2 (two) times daily.   Yes Historical Provider, MD  diclofenac sodium (VOLTAREN) 1 % GEL Apply 2 g topically daily as needed (for pain).    Yes Historical Provider, MD  exenatide (BYETTA 5 MCG PEN) 5 MCG/0.02ML SOPN injection Inject 0.02 mLs (5 mcg total) into the skin 2 (two) times daily with a  meal. 02/02/16  Yes Little Ishikawa, NP  feeding supplement (BOOST / RESOURCE BREEZE) LIQD Take 1 Container by mouth daily. Patient taking differently: Take 1 Container by mouth 2 (two) times daily between meals.  11/11/15  Yes Rodolph Bong, MD  fluticasone [facility]) 50 MCG/ACT nasal spray Place 2 sprays into both nostrils daily. 08/07/14  Yes Kirt Boys, DO  gabapentin (NEURONTIN) 300 MG capsule Take 1 capsule (300 mg total) by mouth 3 (three) times daily. 02/02/16  Yes Little Ishikawa, NP  hydrALAZINE (APRESOLINE) 50 MG tablet Take 1 tablet (50 mg total) by mouth 3 (three) times daily. 11/11/15  Yes Rodolph Bong, MD  ipratropium-albuterol (DUONEB) 0.5-2.5 (3) MG/3ML SOLN Take 3 mLs by nebulization every 6 (six) hours as needed (sob). Use 3 times daily x 4 days then every 6 hours as needed. Patient taking differently: Take 3 mLs by nebulization every 6 (six) hours as needed (for shortness of breath).  11/11/15  Yes Rodolph Bong, MD  isosorbide mononitrate (IMDUR) 30 MG 24 hr tablet TAKE 1 TABLET (30 MG TOTAL) BY MOUTH DAILY. 06/22/15  Yes Historical Provider, MD  latanoprost (XALATAN) 0.005 % ophthalmic solution Place 1 drop into both eyes at bedtime. 06/02/14  Yes Mahima Glade Lloyd, MD  levalbuterol [facility] HFA) 45 MCG/ACT inhaler Inhale 2 puffs into the lungs every 6 (six) hours as needed for wheezing or shortness of breath. 02/03/16  Yes Kirt Boys, DO  levothyroxine (SYNTHROID, LEVOTHROID) 137 MCG tablet Take 1 tablet (137 mcg total) by mouth daily before breakfast. 12/27/15  Yes Kirt Boys, DO  Maltodextrin-Xanthan Gum (RESOURCE THICKENUP CLEAR) POWD Take 120 g by mouth as needed (use to thicken liquids.). Patient taking differently: See admin instructions. USED TO THICKEN LIQUIDS 11/11/15  Yes Rodolph Bong, MD  Multiple Vitamin (MULTIVITAMIN WITH MINERALS) TABS tablet Take 1 tablet by mouth daily.   Yes Historical Provider, MD  NONFORMULARY OR COMPOUNDED ITEM Apply 1-2 g topically 4  (four) times daily. Peripheral Neuropathy cream: Bupivacaine 1% Doxepin 3% Gabapentin 6% Ibuprofen 3% Pentoxifylline 3% 01/20/16  Yes Felecia Shelling, DPM  Nutritional Supplements (FEEDING SUPPLEMENT, OSMOLITE 1.5 CAL,) LIQD Place 1,000 mLs into feeding tube daily. Patient taking differently: Place 1,000 mLs into feeding tube daily. (at night for 12 hours) 09/30/15  Yes Calvert Cantor, MD  ondansetron (ZOFRAN) 4 MG tablet Take 1 tablet (4 mg total) by mouth every 8 (eight) hours as needed for nausea or vomiting. 09/07/15  Yes Sharon Seller, NP  pantoprazole (PROTONIX)  40 MG tablet TAKE 1 TABLET BY MOUTH TWICE A DAY Patient taking differently: Take 40 mg by mouth two times a day 08/11/15  Yes Kirt Boys, DO  Polyethyl Glycol-Propyl Glycol 0.4-0.3 % SOLN Place 1 drop into both eyes 4 (four) times daily.    Yes Historical Provider, MD  polyethylene glycol powder (GLYCOLAX/MIRALAX) powder MIX 17GRAMS IN 8 OUNCES OF LIQUID AND DRINK TWICE DAILY AS NEEDED Patient taking differently: Mix 17 grams into 8 ounces of juice and drink one to two times a day as needed 09/13/15  Yes Kirt Boys, DO  predniSONE (DELTASONE) 20 MG tablet Take 1 tablet (20 mg total) by mouth daily with breakfast. 02/06/16  Yes Little Ishikawa, NP  RESTASIS 0.05 % ophthalmic emulsion USE 1 DROP INTO BOTH EYES TWICE DAILY 11/01/15  Yes Kimber Relic, MD  sertraline (ZOLOFT) 100 MG tablet Take one tablet by mouth once daily for depression Patient taking differently: Take 100 mg by mouth daily. FOR DEPRESSION 09/03/15  Yes Kirt Boys, DO  simethicone (MYLICON) 80 MG chewable tablet Chew 80 mg by mouth 4 (four) times daily as needed for flatulence.   Yes Historical Provider, MD  traMADol (ULTRAM) 50 MG tablet Take 50 mg by mouth daily as needed for moderate pain.    Yes Historical Provider, MD  warfarin (COUMADIN) 5 MG tablet Take 1 tablet (5 mg total) by mouth one time only at 6 PM. 02/02/16  Yes Little Ishikawa, NP  amiodarone (PACERONE) 200 MG  tablet Take 1 tablet (200 mg total) by mouth daily. 02/17/16   Little Ishikawa, NP  predniSONE (DELTASONE) 10 MG tablet Take 3 tablets (30 mg total) by mouth daily with breakfast. Patient not taking: Reported on 02/07/2016 02/04/16   Little Ishikawa, NP  predniSONE (DELTASONE) 10 MG tablet Take 1 tablet (10 mg total) by mouth daily with breakfast. 02/08/16   Little Ishikawa, NP  predniSONE (DELTASONE) 20 MG tablet Take 2 tablets (40 mg total) by mouth daily with breakfast. Patient not taking: Reported on 02/07/2016 02/03/16   Little Ishikawa, NP  Water For Irrigation, Sterile (FREE WATER) SOLN Place 60 mLs into feeding tube every 6 (six) hours. 09/30/15   Calvert Cantor, MD    Physical Exam:   Constitutional: Elderly female who appears to be in moderate discomfort Vitals:   02/07/16 2300 02/07/16 2315 02/07/16 2330 02/08/16 0000  BP: 137/71 127/68 129/67 135/71  Pulse: 112 115 116 112  Resp: 26 19 14 15   Temp:      TempSrc:      SpO2: 100% 100% 100% 100%   Eyes: Mild conjunctival injection with each is present in the medial creases of the eyes. ENMT: Mucous membranes are dry. Posterior pharynx clear of any exudate or lesions. Poor dentition.  Neck: normal, supple, no masses, no thyromegaly Respiratory: Decreased overall breath sounds with some expiratory wheezes appreciated Cardiovascular: Irregular irregular. No extremity edema. 2+ pedal pulses. No carotid bruits.  Abdomen: no tenderness, no masses palpated. No hepatosplenomegaly. PEG tube in place. Bowel sounds positive.  Musculoskeletal: no clubbing / cyanosis. No joint deformity upper and lower extremities. Good ROM, no contractures. Normal muscle tone.  Skin: no rashes, lesions, ulcers. No induration Neurologic: CN 2-12 grossly intact. Sensation intact, DTR normal. Strength 5/5 in all 4.  Psychiatric: Normal judgment and insight. Alert and oriented x 3. Normal mood.     Labs on Admission: I have personally reviewed following labs and imaging  studies  CBC:  Recent Labs Lab 02/01/16 0308 02/07/16 2118  WBC 14.3* 16.6*  NEUTROABS  --  14.9*  HGB 9.2* 10.7*  HCT 27.6* 31.0*  MCV 86.8 84.5  PLT 344 339   Basic Metabolic Panel:  Recent Labs Lab 02/01/16 0308 02/07/16 2118  NA 134* 132*  K 4.5 4.9  CL 104 97*  CO2 23 22  GLUCOSE 151* 135*  BUN 47* 81*  CREATININE 1.41* 1.97*  CALCIUM 9.6 9.8   GFR: Estimated Creatinine Clearance: 17.8 mL/min (by C-G formula based on SCr of 1.97 mg/dL (H)). Liver Function Tests:  Recent Labs Lab 02/07/16 2118  AST 24  ALT 41  ALKPHOS 59  BILITOT 0.5  PROT 7.5  ALBUMIN 3.8   No results for input(s): LIPASE, AMYLASE in the last 168 hours. No results for input(s): AMMONIA in the last 168 hours. Coagulation Profile:  Recent Labs Lab 02/01/16 0308 02/02/16 0504  INR 1.88 2.18   Cardiac Enzymes: No results for input(s): CKTOTAL, CKMB, CKMBINDEX, TROPONINI in the last 168 hours. BNP (last 3 results) No results for input(s): PROBNP in the last 8760 hours. HbA1C: No results for input(s): HGBA1C in the last 72 hours. CBG:  Recent Labs Lab 02/02/16 0022 02/02/16 0443 02/02/16 0724 02/02/16 1118 02/02/16 1618  GLUCAP 125* 116* 100* 193* 179*   Lipid Profile: No results for input(s): CHOL, HDL, LDLCALC, TRIG, CHOLHDL, LDLDIRECT in the last 72 hours. Thyroid Function Tests: No results for input(s): TSH, T4TOTAL, FREET4, T3FREE, THYROIDAB in the last 72 hours. Anemia Panel: No results for input(s): VITAMINB12, FOLATE, FERRITIN, TIBC, IRON, RETICCTPCT in the last 72 hours. Urine analysis:    Component Value Date/Time   COLORURINE YELLOW 02/07/2016 2112   APPEARANCEUR CLOUDY (A) 02/07/2016 2112   LABSPEC 1.013 02/07/2016 2112   PHURINE 8.5 (H) 02/07/2016 2112   GLUCOSEU NEGATIVE 02/07/2016 2112   HGBUR NEGATIVE 02/07/2016 2112   BILIRUBINUR NEGATIVE 02/07/2016 2112   KETONESUR NEGATIVE 02/07/2016 2112   PROTEINUR 30 (A) 02/07/2016 2112   UROBILINOGEN 0.2  01/27/2015 2308   NITRITE POSITIVE (A) 02/07/2016 2112   LEUKOCYTESUR LARGE (A) 02/07/2016 2112   Sepsis Labs: No results found for this or any previous visit (from the past 240 hour(s)).   Radiological Exams on Admission: Dg Chest 2 View  Result Date: 02/07/2016 CLINICAL DATA:  Left-sided chest pain and pressure EXAM: CHEST  2 VIEW COMPARISON:  Chest radiograph 01/28/2016 FINDINGS: There is persistent atherosclerotic calcification in the aortic arch. Cardiomediastinal contours are otherwise normal. No pneumothorax or sizable pleural effusion. No pulmonary edema or focal airspace consolidation. IMPRESSION: No active cardiopulmonary disease. Aortic atherosclerosis. Electronically Signed   By: Deatra Robinson M.D.   On: 02/07/2016 22:15   Ir Patient Eval Tech 0-60 Mins  Result Date: 02/07/2016 Daryel November     02/07/2016  3:08 PM Patient came in with complaint if red, painful insertion site of GJ tube.  Patient's daughter was concerned that the tube had become too tight.  The skin did appear slightly red and moist. There was minimal granuloma tissue but the patient appeared very affected by it.  Jeananne Rama  PA, inspected the skin site as well and applied silver nitrate to the granuloma tissue.  The patient and her daughter were advised to apply a barrier cream or vasoline and a single layer of guaze to protect the skin under the bumper.  The patient and daughter understood the instructions and wiill contact us if there are any further concerns   EKG:  Independently reviewed. Atrial fibrillation/flutter  Assessment/Plan Sepsis 2/2  urinary tract infection - Admit to telemetry bed - Follow-up blood and urine cultures - Continued antibiotics of cefepime IV per pharmacy  Chest pain: Initial troponin negative repeat troponin 0.03. - Trend cardiac troponin  Acute renal failure on chronic kidney disease stage III: Patient baseline creatinine appears to be around 1.3 patient presents with creatinine  of 1.97 with BUN of 8110s to prerenal cause of symptoms. - IV fluids - Recheck BMP in a.m.  Addendum: Gastric outlet obstruction S/p peg tube/ stage: - Checked abdominal x-ray and shows PEG tube coiled in abdomen.  - Made patient NPO  - Determine if IR needs to be consulted in a.m. for replacement  Dyspnea: Acute on chronic. - Continue nebulizer breathing treatments as needed - Continue steroid taper - question if worsening respiratory symptoms secondary to amiodarone  Leukocytosis WBC elevated at 16.6. - continue to monitor  Atrial fibrillation: Chronic. ChadsVasc=5  - Continue amiodarone   Diastolic CHF: Last echocardiogram performed on 01/27/2016 with EF noted to be 55-60% with grade 1 diastolic dysfunction. - Continue isosorbide mononitrate  Essential hypertension - Continue amlodipine, hydralazine  Diabetes mellitus type 2 - held [place] - Hypoglycemic protocols - CBGs every before meals with sensitive sliding scale insulin for now   DVT of the left leg: Recently diagnosed last month for which the patient has been on Coumadin. - Checking PT/INR - Coumadin per pharmacy Addendum: supratherapeutic INR at 4.42 holding Coumadin  Depression - Continue Zoloft  Hypothyroidism: TSH previously noted to be 8.74 on 10/24 - Continue levothyroxine  Anemia: Chronic. Hemoglobin 10.7 on admission;which appears near patient's baseline. - Recheck CBC in a.m.  GERD  - continue Protonix  DVT prophylaxis: On Coumadin   Code Status: Partial code Family Communication: Discussed care patient with daughter at bedside Disposition Plan: TBD Consults called:  None Admission status: Inpatient  Clydie Braun MD [facility] Pager [phone]  If 7PM-7AM, please contact night-coverage [email or web address] Password [email or web address]  02/08/2016, 12:40 AM

## 2016-02-08 NOTE — Progress Notes (Signed)
PROGRESS NOTE    Shannon [name]  NWG:[id number] DOB: 07/24/1923 DOA: 02/07/2016 PCP: Kirt Boys, DO   Chief Complaint  Patient presents with  . Code Sepsis    Brief Narrative:  HPI on 02/08/2016 by Dr. Madelyn Flavors Shannon [name] is a 82 y.o. female with medical history significant of HTN, asthma/COPD, A. fib on apixiban, CHF, DVT, CVA, gastric outlet obstruction s/p Peg, HLD, hypothyroidism, SIADH, and  anemia; who presented with complaints of shortness of breath and chest pain. She had reported pain  and leaking around the peg tube which initially gave family concern for PEG tube misplacement. The patient had been seen at [facility] earlier today for her PEG tube by interventional radiology PA-C. Noted to have minimal granuloma tissue present and they applied silver nitrate to the granuloma.  She was advised about how to protect skin underneath the bumper and discharged home. After the patient at home this afternoon she reports that it felt like someone standing on her chest. She has had chest pain intermittently that comes and goes. Patient daughter  that she has been complaining of shortness of breath also for which they had been giving her treating with Advir and intermittent Xopenex inhalers at home. Associated symptoms included palpitations, nausea,  vomiting, eye irritation, neck soreness, and pain. Patient tired to lay down without relief of symptoms.  Daughter checked pulse and noted that it was elevated and called the EMS to bring her mother for further evaluation. Assessment & Plan   Sepsis secondary to urinary tract infection -Upon admission, patient was febrile with tachycardia, tachypnea and leukocytosis -UA: Many bacteria, TNTC WBCs, large leukocytes, positive nitrites -Urine and blood cultures pending -Continue ceftriaxone  Chest pain/mildly elevated troponin -Patient currently chest pain-free -Troponin 0.03, 0.04 -Continue to trend -Likely secondary to  sepsis -Continue imdur, coumadin, statin  Acute on chronic kidney disease, stage III -Baseline creatinine 1.3-1.5, upon admission creatinine 1.97 -Creatinine improving currently 1.65 -Continue to monitor BMP  Gastric outlet obstruction -Interventional radiology consultation appreciated -Patient did have a GJ-tube replacement -Continue tube feeds  Acute on chronic dyspnea -Chest x-ray unremarkable for pneumonia -Continue to monitor closely -Continue nebulizer treatments, steroid taper -?due to amiodarone  Atrial fibrillation -CHADSVASC 5 -Continue amiodarone -INR currently supratherapeutic, continue Coumadin per pharmacy  Diabetes mellitus, type II -Continue insulin sliding scale CBG monitoring -[place] held  Essential hypertension -Continue amlodipine, hydralazine  Chronic diastolic heart failure -Echocardiogram 01/27/2016 showed EF of 55-60%, grade 1 diastolic dysfunction -Currently euvolemic -Monitor intake and output, daily weights  History of LLE DVT -Continue Coumadin per pharmacy, currently INR is supratherapeutic  Depression -Continue Zoloft  Hypothyroidism -Continue Synthroid  Chronic anemia -Hemoglobin currently 10 -Appears to be close to baseline, continue to monitor CBC  GERD -Continue PPI  DVT Prophylaxis  INR is supratherapeutic, Coumadin  Code Status: Partial  Family Communication: None at bedside  Disposition Plan: Admitted  Consultants Interventional radiology  Procedures  GJ tube replacement  Antibiotics   Anti-infectives    Start     Dose/Rate Route Frequency Ordered Stop   02/08/16 2200  ceFEPIme (MAXIPIME) 1 g in dextrose 5 % 50 mL IVPB     1 g 100 mL/hr over 30 Minutes Intravenous Every 24 hours 02/08/16 0229     02/07/16 2330  ceFEPIme (MAXIPIME) 2 g in dextrose 5 % 50 mL IVPB     2 g 100 mL/hr over 30 Minutes Intravenous  Once 02/07/16 2317 02/08/16 0035      Subjective:  Shannon [name] seen and examined today.  Patient states she is feeling mildly better. Currently denies chest pain or shortness of breath. Denies abdominal pain, nausea or vomiting, diarrhea or constipation.   Objective:   Vitals:   02/08/16 0300 02/08/16 0419 02/08/16 0628 02/08/16 1243  BP: 128/69 (!) 143/66 129/66 136/67  Pulse: 112 (!) 108 (!) 113 (!) 115  Resp: (!) 28 18 20 20   Temp:  98.1 F (36.7 C) 98.2 F (36.8 C) 98.9 F (37.2 C)  TempSrc:  Oral Oral Oral  SpO2: 99% 100% 100% 100%    Intake/Output Summary (Last 24 hours) at 02/08/16 1352 Last data filed at 02/08/16 1235  Gross per 24 hour  Intake           2142.5 ml  Output             1050 ml  Net           1092.5 ml   There were no vitals filed for this visit.  Exam  General: Well developed, well nourished, NAD, appears stated age  HEENT: NCAT, mucous membranes moist.   Cardiovascular: S1 S2 auscultated, irregular  Respiratory: Diminished, mild expiratory wheezing  Abdomen: Soft, nontender, nondistended, + bowel sounds, +peg  Extremities: warm dry without cyanosis clubbing or edema  Neuro: AAOx3, nonfocal  Psych: Normal affect and demeanor with intact judgement and insight   Data Reviewed: I have personally reviewed following labs and imaging studies  CBC:  Recent Labs Lab 02/07/16 2118 02/08/16 0340  WBC 16.6* 16.0*  NEUTROABS 14.9*  --   HGB 10.7* 10.0*  HCT 31.0* 29.3*  MCV 84.5 84.4  PLT 339 305   Basic Metabolic Panel:  Recent Labs Lab 02/07/16 2118 02/08/16 0340  NA 132* 134*  K 4.9 5.1  CL 97* 103  CO2 22 22  GLUCOSE 135* 130*  BUN 81* 63*  CREATININE 1.97* 1.65*  CALCIUM 9.8 8.9   GFR: Estimated Creatinine Clearance: 21.2 mL/min (by C-G formula based on SCr of 1.65 mg/dL (H)). Liver Function Tests:  Recent Labs Lab 02/07/16 2118  AST 24  ALT 41  ALKPHOS 59  BILITOT 0.5  PROT 7.5  ALBUMIN 3.8   No results for input(s): LIPASE, AMYLASE in the last 168 hours. No results for input(s): AMMONIA in the  last 168 hours. Coagulation Profile:  Recent Labs Lab 02/02/16 0504 02/08/16 0340  INR 2.18 4.42*   Cardiac Enzymes:  Recent Labs Lab 02/08/16 0340 02/08/16 0726  TROPONINI 0.03* 0.04*   BNP (last 3 results) No results for input(s): PROBNP in the last 8760 hours. HbA1C: No results for input(s): HGBA1C in the last 72 hours. CBG:  Recent Labs Lab 02/02/16 0724 02/02/16 1118 02/02/16 1618 02/08/16 0909 02/08/16 1134  GLUCAP 100* 193* 179* 120* 123*   Lipid Profile: No results for input(s): CHOL, HDL, LDLCALC, TRIG, CHOLHDL, LDLDIRECT in the last 72 hours. Thyroid Function Tests: No results for input(s): TSH, T4TOTAL, FREET4, T3FREE, THYROIDAB in the last 72 hours. Anemia Panel: No results for input(s): VITAMINB12, FOLATE, FERRITIN, TIBC, IRON, RETICCTPCT in the last 72 hours. Urine analysis:    Component Value Date/Time   COLORURINE YELLOW 02/07/2016 2112   APPEARANCEUR CLOUDY (A) 02/07/2016 2112   LABSPEC 1.013 02/07/2016 2112   PHURINE 8.5 (H) 02/07/2016 2112   GLUCOSEU NEGATIVE 02/07/2016 2112   HGBUR NEGATIVE 02/07/2016 2112   BILIRUBINUR NEGATIVE 02/07/2016 2112   KETONESUR NEGATIVE 02/07/2016 2112   PROTEINUR 30 (A) 02/07/2016 2112   UROBILINOGEN  0.2 01/27/2015 2308   NITRITE POSITIVE (A) 02/07/2016 2112   LEUKOCYTESUR LARGE (A) 02/07/2016 2112   Sepsis Labs: @LABRCNTIP (procalcitonin:4,lacticidven:4)  )No results found for this or any previous visit (from the past 240 hour(s)).    Radiology Studies: Dg Chest 2 View  Result Date: 02/07/2016 CLINICAL DATA:  Left-sided chest pain and pressure EXAM: CHEST  2 VIEW COMPARISON:  Chest radiograph 01/28/2016 FINDINGS: There is persistent atherosclerotic calcification in the aortic arch. Cardiomediastinal contours are otherwise normal. No pneumothorax or sizable pleural effusion. No pulmonary edema or focal airspace consolidation. IMPRESSION: No active cardiopulmonary disease. Aortic atherosclerosis.  Electronically Signed   By: Deatra Robinson M.D.   On: 02/07/2016 22:15   Dg Abd Portable 1v  Result Date: 02/08/2016 CLINICAL DATA:  Peg tube placement.  Abdominal pain. EXAM: PORTABLE ABDOMEN - 1 VIEW COMPARISON:  01/03/2016 FINDINGS: Percutaneous gastrojejunostomy tube demonstrated in the left upper quadrant. Contrast injected into the tube fills the stomach. No contrast extravasation. The retention balloon is demonstrated along the greater curvature of the stomach. The tubing is coiled in the stomach. No small or large bowel distention. Scattered stool in the colon. Surgical clips in the right upper quadrant. Atelectasis in the lung bases. IMPRESSION: Gastrojejunostomy tube remains coiled in the stomach. No contrast extravasation. Electronically Signed   By: Burman Nieves M.D.   On: 02/08/2016 02:33   Ir Patient Eval Tech 0-60 Mins  Result Date: 02/07/2016 Shannon [name]     02/07/2016  3:08 PM Patient came in with complaint if red, painful insertion site of GJ tube.  Patient's daughter was concerned that the tube had become too tight.  The skin did appear slightly red and moist. There was minimal granuloma tissue but the patient appeared very affected by it.  Jeananne Rama  PA, inspected the skin site as well and applied silver nitrate to the granuloma tissue.  The patient and her daughter were advised to apply a barrier cream or vasoline and a single layer of guaze to protect the skin under the bumper.  The patient and daughter understood the instructions and wiill contact us if there are any further concerns    Scheduled Meds: . amiodarone  200 mg Oral BID  . amLODipine  10 mg Oral Daily  . atorvastatin  10 mg Oral Daily  . B-complex with vitamin C  1 tablet Oral Daily  . calcium-vitamin D  1 tablet Oral Q breakfast  . ceFEPime (MAXIPIME) IV  1 g Intravenous Q24H  . chlorhexidine  15 mL Mouth/Throat BID  . cycloSPORINE  1 drop Both Eyes BID  . fluticasone  2 spray Each Nare Daily  . free  water  60 mL Per Tube Q6H  . gabapentin  300 mg Oral TID  . hydrALAZINE  50 mg Oral TID  . insulin aspart  0-9 Units Subcutaneous TID WC  . isosorbide mononitrate  30 mg Oral Daily  . latanoprost  1 drop Both Eyes QHS  . levothyroxine  137 mcg Oral QAC breakfast  . loratadine  10 mg Oral Daily  . mometasone-formoterol  2 puff Inhalation BID  . pantoprazole  40 mg Oral BID  . polyvinyl alcohol  1 drop Both Eyes QID  . predniSONE  20 mg Oral Q breakfast  . sertraline  100 mg Oral Daily   Continuous Infusions: . sodium chloride 75 mL (02/08/16 0913)     LOS: 0 days   Time Spent in minutes   30 minutes  Kena Limon  D.O. on 02/08/2016 at 1:52 PM  Between 7am to 7pm - Pager - [phone]  After 7pm go to [email or web address] - password [email or web address]  And look for the night coverage person covering for me after hours  [facility] Office  [phone]

## 2016-02-08 NOTE — Progress Notes (Addendum)
ANTICOAGULATION/ANTIBIOTIC CONSULT NOTE - Initial Consult  Pharmacy Consult for Coumadin and Cefepime Indication: afib, recent DVT 10/17 and sepsis 2/2 UTI  No Known Allergies  Patient Measurements:    Vital Signs: Temp: 100.4 F (38 C) (11/07 0015) Temp Source: Rectal (11/07 0015) BP: 159/68 (11/07 0100) Pulse Rate: 107 (11/07 0100)  Labs:  Recent Labs  02/07/16 2118  HGB 10.7*  HCT 31.0*  PLT 339  CREATININE 1.97*    Estimated Creatinine Clearance: 17.8 mL/min (by C-G formula based on SCr of 1.97 mg/dL (H)).   Medical History: Past Medical History:  Diagnosis Date  . Anemia    previous blood transfusions  . Arthritis    "all over"  . Asthma   . Bradycardia    topped BB and amiodarone decreased- junctional rhythm   . CHF (congestive heart failure) ([place])   . Complication of blood transfusion    "got the wrong blood type at [facility] in ~ 2015; no adverse reaction that we are aware of"/daughter, [name] (01/27/2016)  . COPD (chronic obstructive pulmonary disease) ([place])   . Depression    "light case"  . DVT (deep venous thrombosis) ([place]) 01/2016   LLE  . Gastric stenosis   . GERD (gastroesophageal reflux disease)   . History of blood transfusion    "several" (01/27/2016)  . History of stomach ulcers   . Hyperlipidemia   . Hypertension   . Hypothyroidism   . Paraesophageal hernia   . Perforated gastric ulcer ([place])   . Pneumonia ?2017 X 1  . Renal insufficiency   . Seasonal allergies   . SIADH (syndrome of inappropriate ADH production) ([place])    [name] 01/10/2015  . Small bowel obstruction    "I don't know how many" (01/11/2015)  . Stroke ([place])    "light one"  . Type II diabetes mellitus ([place])    "related to prednisone use  for > 20 yr; once predinose stopped; no more DM RX" (01/27/2016)  . Ventral hernia with bowel obstruction     Medications:  See electronic med rec  Assessment: 82 y.o. F presents with SOB and CP.  AC: Coumadin PTA for afib.  Admission INR therapeutic (2.18). CBC ok on admission. Home dose: 5mg  daily - last taken 11/6 1800  ID: Cefepime for sepsis 2/2 UTI.  Antimicrobials this admission:  11/7 Cefepime >>   Microbiology results:  11/6 BCx x2:  11/6 UCx:    Goal of Therapy:  INR 2-3; resolution of UTI Monitor platelets by anticoagulation protocol: Yes   Plan:  Daily INR Cefepime 1gm IV q24h Will f/u micro data, renal function, and pt's clinical condition  Sherlon Handing, PharmD, BCPS Clinical pharmacist, pager [phone] 02/08/2016,2:23 AM

## 2016-02-09 ENCOUNTER — Other Ambulatory Visit: Payer: Medicare Other

## 2016-02-09 ENCOUNTER — Ambulatory Visit: Payer: Medicare Other

## 2016-02-09 LAB — BASIC METABOLIC PANEL
Anion gap: 7 (ref 5–15)
BUN: 40 mg/dL — ABNORMAL HIGH (ref 6–20)
CO2: 20 mmol/L — ABNORMAL LOW (ref 22–32)
Calcium: 8.6 mg/dL — ABNORMAL LOW (ref 8.9–10.3)
Chloride: 107 mmol/L (ref 101–111)
Creatinine, Ser: 1.3 mg/dL — ABNORMAL HIGH (ref 0.44–1.00)
GFR calc Af Amer: 41 mL/min — ABNORMAL LOW (ref >60)
GFR calc non Af Amer: 35 mL/min — ABNORMAL LOW (ref >60)
Glucose, Bld: 212 mg/dL — ABNORMAL HIGH (ref 65–99)
Potassium: 4.3 mmol/L (ref 3.5–5.1)
Sodium: 134 mmol/L — ABNORMAL LOW (ref 135–145)

## 2016-02-09 LAB — GLUCOSE, CAPILLARY
Glucose-Capillary: 188 mg/dL — ABNORMAL HIGH (ref 65–99)
Glucose-Capillary: 174 mg/dL — ABNORMAL HIGH (ref 65–99)
Glucose-Capillary: 218 mg/dL — ABNORMAL HIGH (ref 65–99)
Glucose-Capillary: 288 mg/dL — ABNORMAL HIGH (ref 65–99)

## 2016-02-09 LAB — PROTIME-INR
INR: 4.43
Prothrombin Time: 43.7 seconds — ABNORMAL HIGH (ref 11.4–15.2)

## 2016-02-09 LAB — CBC
HCT: 25.3 % — ABNORMAL LOW (ref 36.0–46.0)
Hemoglobin: 8.7 g/dL — ABNORMAL LOW (ref 12.0–15.0)
MCH: 29.1 pg (ref 26.0–34.0)
MCHC: 34.4 g/dL (ref 30.0–36.0)
MCV: 84.6 fL (ref 78.0–100.0)
Platelets: 274 10*3/uL (ref 150–400)
RBC: 2.99 MIL/uL — ABNORMAL LOW (ref 3.87–5.11)
RDW: 17.5 % — ABNORMAL HIGH (ref 11.5–15.5)
WBC: 8.1 10*3/uL (ref 4.0–10.5)

## 2016-02-09 NOTE — Progress Notes (Signed)
ANTICOAGULATION CONSULT NOTE - Follow Up Consult  Pharmacy Consult for Coumadin Indication: atrial fibrillation and recent DVT  Not on File  Patient Measurements: Height: 5\' 6"  (167.6 cm) (02/02/16 encounter) Weight: 131 lb (59.4 kg) (from 02/02/16 encounter) IBW/kg (Calculated) : 59.3 Heparin Dosing Weight:   Vital Signs: Temp: 98.1 F (36.7 C) (11/08 0822) Temp Source: Oral (11/08 0822) BP: 135/46 (11/08 0954) Pulse Rate: 85 (11/08 0954)  Labs:  Recent Labs  02/07/16 2118 02/08/16 0340 02/08/16 0726 02/08/16 1300 02/09/16 0709  HGB 10.7* 10.0*  --   --  8.7*  HCT 31.0* 29.3*  --   --  25.3*  PLT 339 305  --   --  274  APTT  --  52*  --   --   --   LABPROT  --  43.3*  --   --  43.7*  INR  --  4.42*  --   --  4.43*  CREATININE 1.97* 1.65*  --   --  1.30*  TROPONINI  --  0.03* 0.04* 0.04*  --     Estimated Creatinine Clearance: 26.9 mL/min (by C-G formula based on SCr of 1.3 mg/dL (H)).   Assessment: 82yo female with AFib and recent DVT on Eliquis, now on Coumadin.  INR supra-therapeutic at 4.43.  Hg dropped slightly but stable compared to 10/29, pltc wnl.  No bleeding noted.  PTA dose = Coumadin 5 mg daily  Goal of Therapy:  INR 2-3 Monitor platelets by anticoagulation protocol: Yes   Plan:  No Coumadin today Daily INR Watch for s/s of bleeding   [place] Student

## 2016-02-09 NOTE — Progress Notes (Signed)
PROGRESS NOTE    Shannon [name]  WGN:[id number] DOB: 1923-09-18 DOA: 02/07/2016 PCP: Kirt Boys, DO   Chief Complaint  Patient presents with  . Code Sepsis    Brief Narrative:  82 year old female with history of hypertension, asthma, atrial fibrillation, CHF, gastric outlet obstruction, that presented with complaints of shortness of breath and chest pain along with leakage from her PEG tube. Patient found to have sepsis secondary to urinary tract infection and currently on ceftriaxone. Interventional radiology consulted, J G-tube replaced.  Assessment & Plan   Sepsis secondary to urinary tract infection -Upon admission, patient was febrile with tachycardia, tachypnea and leukocytosis (all improving) -UA: Many bacteria, TNTC WBCs, large leukocytes, positive nitrites -Urine culture >100K GNR, pending species and sensitivities  -Blood cultures show no growth to date -Continue ceftriaxone  Chest pain/mildly elevated troponin -Patient currently chest pain-free -Troponin 0.03, 0.04, 0.04 (flat) -Continue to trend -Likely secondary to sepsis -Continue imdur, coumadin, statin  Acute on chronic kidney disease, stage III -Baseline creatinine 1.3-1.5, upon admission creatinine 1.97 -Creatinine improving currently 1.30 -Continue to monitor BMP  Gastric outlet obstruction -Interventional radiology consultation appreciated -Patient did have a GJ-tube replacement -Continue tube feeds and full liquid diet  Acute on chronic dyspnea -Per patient, improving -Chest x-ray unremarkable for pneumonia -Continue to monitor closely -Continue nebulizer treatments, steroid taper -?due to amiodarone  Atrial fibrillation -CHADSVASC 5 -Continue amiodarone -INR currently supratherapeutic at 4.43, continue to monitor, no need for reversal  Diabetes mellitus, type II -Continue insulin sliding scale CBG monitoring -Byetta held  Essential hypertension -Continue amlodipine, hydralazine  Chronic  diastolic heart failure -Echocardiogram 01/27/2016 showed EF of 55-60%, grade 1 diastolic dysfunction -Currently euvolemic -Monitor intake and output, daily weights  History of LLE DVT -Continue Coumadin per pharmacy, currently INR is supratherapeutic  Depression -Continue Zoloft  Hypothyroidism -Continue Synthroid  Chronic anemia -Hemoglobin currently 8.7, baseline  9-10 -Appears to be close to baseline, continue to monitor CBC  GERD -Continue PPI  Moderate malnutrition -Nutrition consulted -Continue tube fees  DVT Prophylaxis  INR is supratherapeutic, Coumadin  Code Status: Partial  Family Communication: None at bedside  Disposition Plan: Admitted. Possible discharge to home within 24-48 hours pending results of urine culture.  Consultants Interventional radiology  Procedures  GJ tube replacement  Antibiotics   Anti-infectives    Start     Dose/Rate Route Frequency Ordered Stop   02/08/16 2200  ceFEPIme (MAXIPIME) 1 g in dextrose 5 % 50 mL IVPB     1 g 100 mL/hr over 30 Minutes Intravenous Every 24 hours 02/08/16 0229     02/07/16 2330  ceFEPIme (MAXIPIME) 2 g in dextrose 5 % 50 mL IVPB     2 g 100 mL/hr over 30 Minutes Intravenous  Once 02/07/16 2317 02/08/16 0035      Subjective:   Shannon [name] seen and examined today. Patient states she is feeling mildly better today. States her breathing is at baseline and she uses and inhaler at home when needed. Does complain of burning with urination. Denies current chest pain. Denies abdominal pain, nausea or vomiting, diarrhea or constipation.   Objective:   Vitals:   02/09/16 0534 02/09/16 0822 02/09/16 0900 02/09/16 0954  BP: (!) 142/82 (!) 141/59  (!) 135/46  Pulse: 97 (!) 38  85  Resp: (!) 24 14  14   Temp: 97.6 F (36.4 C) 98.1 F (36.7 C)    TempSrc: Oral Oral    SpO2: 100% 100% 99%   Weight:  Height:        Intake/Output Summary (Last 24 hours) at 02/09/16 1139 Last data filed at 02/09/16  1100  Gross per 24 hour  Intake          1636.25 ml  Output              950 ml  Net           686.25 ml   Filed Weights   02/08/16 1554  Weight: 59.4 kg (131 lb)    Exam  General: Well developed, well nourished, NAD, appears stated age  HEENT: NCAT, mucous membranes moist.   Cardiovascular: S1 S2 auscultated, irregular  Respiratory: Clear to auscultation, no wheezing.   Abdomen: Soft, nontender, nondistended, + bowel sounds, +peg  Extremities: warm dry without cyanosis clubbing or edema  Neuro: AAOx3, nonfocal  Psych: Normal affect and demeanor, pleasant   Data Reviewed: I have personally reviewed following labs and imaging studies  CBC:  Recent Labs Lab 02/07/16 2118 02/08/16 0340 02/09/16 0709  WBC 16.6* 16.0* 8.1  NEUTROABS 14.9*  --   --   HGB 10.7* 10.0* 8.7*  HCT 31.0* 29.3* 25.3*  MCV 84.5 84.4 84.6  PLT 339 305 274   Basic Metabolic Panel:  Recent Labs Lab 02/07/16 2118 02/08/16 0340 02/09/16 0709  NA 132* 134* 134*  K 4.9 5.1 4.3  CL 97* 103 107  CO2 22 22 20*  GLUCOSE 135* 130* 212*  BUN 81* 63* 40*  CREATININE 1.97* 1.65* 1.30*  CALCIUM 9.8 8.9 8.6*   GFR: Estimated Creatinine Clearance: 26.9 mL/min (by C-G formula based on SCr of 1.3 mg/dL (H)). Liver Function Tests:  Recent Labs Lab 02/07/16 2118  AST 24  ALT 41  ALKPHOS 59  BILITOT 0.5  PROT 7.5  ALBUMIN 3.8   No results for input(s): LIPASE, AMYLASE in the last 168 hours. No results for input(s): AMMONIA in the last 168 hours. Coagulation Profile:  Recent Labs Lab 02/08/16 0340 02/09/16 0709  INR 4.42* 4.43*   Cardiac Enzymes:  Recent Labs Lab 02/08/16 0340 02/08/16 0726 02/08/16 1300  TROPONINI 0.03* 0.04* 0.04*   BNP (last 3 results) No results for input(s): PROBNP in the last 8760 hours. HbA1C: No results for input(s): HGBA1C in the last 72 hours. CBG:  Recent Labs Lab 02/08/16 0909 02/08/16 1134 02/08/16 1705 02/08/16 2119 02/09/16 0801    GLUCAP 120* 123* 186* 260* 188*   Lipid Profile: No results for input(s): CHOL, HDL, LDLCALC, TRIG, CHOLHDL, LDLDIRECT in the last 72 hours. Thyroid Function Tests: No results for input(s): TSH, T4TOTAL, FREET4, T3FREE, THYROIDAB in the last 72 hours. Anemia Panel: No results for input(s): VITAMINB12, FOLATE, FERRITIN, TIBC, IRON, RETICCTPCT in the last 72 hours. Urine analysis:    Component Value Date/Time   COLORURINE YELLOW 02/07/2016 2112   APPEARANCEUR CLOUDY (A) 02/07/2016 2112   LABSPEC 1.013 02/07/2016 2112   PHURINE 8.5 (H) 02/07/2016 2112   GLUCOSEU NEGATIVE 02/07/2016 2112   HGBUR NEGATIVE 02/07/2016 2112   BILIRUBINUR NEGATIVE 02/07/2016 2112   KETONESUR NEGATIVE 02/07/2016 2112   PROTEINUR 30 (A) 02/07/2016 2112   UROBILINOGEN 0.2 01/27/2015 2308   NITRITE POSITIVE (A) 02/07/2016 2112   LEUKOCYTESUR LARGE (A) 02/07/2016 2112   Sepsis Labs: @LABRCNTIP (procalcitonin:4,lacticidven:4)  ) Recent Results (from the past 240 hour(s))  Urine culture     Status: Abnormal (Preliminary result)   Collection Time: 02/07/16  9:12 PM  Result Value Ref Range Status   Specimen Description URINE, CLEAN CATCH  Final   Special Requests CX ADDED AT 0049 ON [id number]  Final   Culture >=100,000 COLONIES/mL ESCHERICHIA COLI (A)  Final   Report Status PENDING  Incomplete  Blood Culture (routine x 2)     Status: None (Preliminary result)   Collection Time: 02/07/16 11:45 PM  Result Value Ref Range Status   Specimen Description BLOOD RIGHT FOREARM  Final   Special Requests BOTTLES DRAWN AEROBIC AND ANAEROBIC  Final   Culture NO GROWTH 1 DAY  Final   Report Status PENDING  Incomplete  Blood Culture (routine x 2)     Status: None (Preliminary result)   Collection Time: 02/07/16 11:55 PM  Result Value Ref Range Status   Specimen Description BLOOD LEFT ARM  Final   Special Requests IN PEDIATRIC BOTTLE  Final   Culture NO GROWTH 1 DAY  Final   Report Status PENDING  Incomplete       Radiology Studies: Dg Chest 2 View  Result Date: 02/07/2016 CLINICAL DATA:  Left-sided chest pain and pressure EXAM: CHEST  2 VIEW COMPARISON:  Chest radiograph 01/28/2016 FINDINGS: There is persistent atherosclerotic calcification in the aortic arch. Cardiomediastinal contours are otherwise normal. No pneumothorax or sizable pleural effusion. No pulmonary edema or focal airspace consolidation. IMPRESSION: No active cardiopulmonary disease. Aortic atherosclerosis. Electronically Signed   By: Deatra Robinson M.D.   On: 02/07/2016 22:15   Ir Gj Tube Change  Result Date: 02/08/2016 INDICATION: Chronic GJ tube, coiled in the stomach EXAM: JEJUNAL CATHETER REPLACEMENT MEDICATIONS: None. ANESTHESIA/SEDATION: None immediate CONTRAST:  20 cc - administered into the gastric lumen. FLUOROSCOPY TIME:  Fluoroscopy Time: 11 minutes 12 seconds (24 mGy). COMPLICATIONS: None immediate. PROCEDURE: Informed written consent was obtained from the patient after a thorough discussion of the procedural risks, benefits and alternatives. All questions were addressed. Maximal Sterile Barrier Technique was utilized including caps, mask, sterile gowns, sterile gloves, sterile drape, hand hygiene and skin antiseptic. A timeout was performed prior to the initiation of the procedure. The existing GJ tube coiled within the stomach was removed over a Bentson guidewire. C2 catheter and Glidewire were utilized to manipulate the access into the proximal jejunum. Over the glidewire, a new gastrojejunostomy tube was advanced with the jejunal tube in the proximal jejunum. The gastrostomy port confirmed in the stomach. Gastric retention balloon inflated with 7 cc saline containing 1 cc contrast. Images obtained for documentation. Feeding tube ready for use. IMPRESSION: Successful fluoroscopic exchange of the gastrojejunostomy. Electronically Signed   By: Judie Petit.  Shick M.D.   On: 02/08/2016 15:28   Dg Abd Portable 1v  Result Date:  02/08/2016 CLINICAL DATA:  Peg tube placement.  Abdominal pain. EXAM: PORTABLE ABDOMEN - 1 VIEW COMPARISON:  01/03/2016 FINDINGS: Percutaneous gastrojejunostomy tube demonstrated in the left upper quadrant. Contrast injected into the tube fills the stomach. No contrast extravasation. The retention balloon is demonstrated along the greater curvature of the stomach. The tubing is coiled in the stomach. No small or large bowel distention. Scattered stool in the colon. Surgical clips in the right upper quadrant. Atelectasis in the lung bases. IMPRESSION: Gastrojejunostomy tube remains coiled in the stomach. No contrast extravasation. Electronically Signed   By: Burman Nieves M.D.   On: 02/08/2016 02:33   Ir Patient Eval Tech 0-60 Mins  Result Date: 02/07/2016 Daryel November     02/07/2016  3:08 PM Patient came in with complaint if red, painful insertion site of GJ tube.  Patient's daughter was concerned that the tube  had become too tight.  The skin did appear slightly red and moist. There was minimal granuloma tissue but the patient appeared very affected by it.  Jeananne Rama  PA, inspected the skin site as well and applied silver nitrate to the granuloma tissue.  The patient and her daughter were advised to apply a barrier cream or vasoline and a single layer of guaze to protect the skin under the bumper.  The patient and daughter understood the instructions and wiill contact us if there are any further concerns    Scheduled Meds: . amiodarone  200 mg Oral BID  . amLODipine  10 mg Oral Daily  . atorvastatin  10 mg Oral Daily  . B-complex with vitamin C  1 tablet Oral Daily  . calcium-vitamin D  1 tablet Oral Q breakfast  . ceFEPime (MAXIPIME) IV  1 g Intravenous Q24H  . chlorhexidine  15 mL Mouth/Throat BID  . cycloSPORINE  1 drop Both Eyes BID  . feeding supplement (OSMOLITE 1.5 CAL)  1,000 mL Per Tube Q24H  . fluticasone  2 spray Each Nare Daily  . free water  60 mL Per Tube Q6H  . gabapentin   300 mg Oral TID  . hydrALAZINE  50 mg Oral TID  . insulin aspart  0-9 Units Subcutaneous TID WC  . isosorbide mononitrate  30 mg Oral Daily  . latanoprost  1 drop Both Eyes QHS  . levothyroxine  137 mcg Oral QAC breakfast  . loratadine  10 mg Oral Daily  . mometasone-formoterol  2 puff Inhalation BID  . pantoprazole  40 mg Oral BID  . polyvinyl alcohol  1 drop Both Eyes QID  . predniSONE  20 mg Oral Q breakfast  . sertraline  100 mg Oral Daily   Continuous Infusions: . sodium chloride 75 mL/hr at 02/09/16 0545     LOS: 1 day   Time Spent in minutes   30 minutes  Mckenna Gamm D.O. on 02/09/2016 at 11:39 AM  Between 7am to 7pm - Pager - [phone]  After 7pm go to [email or web address] - password [email or web address]  And look for the night coverage person covering for me after hours  [facility] Office  [phone]

## 2016-02-10 ENCOUNTER — Encounter: Payer: Self-pay | Admitting: Internal Medicine

## 2016-02-10 DIAGNOSIS — A4151 Sepsis due to Escherichia coli [E. coli]: Secondary | ICD-10-CM

## 2016-02-10 DIAGNOSIS — R1312 Dysphagia, oropharyngeal phase: Secondary | ICD-10-CM

## 2016-02-10 DIAGNOSIS — I10 Essential (primary) hypertension: Secondary | ICD-10-CM

## 2016-02-10 DIAGNOSIS — N390 Urinary tract infection, site not specified: Secondary | ICD-10-CM

## 2016-02-10 LAB — BASIC METABOLIC PANEL
Anion gap: 8 (ref 5–15)
BUN: 37 mg/dL — ABNORMAL HIGH (ref 6–20)
CO2: 21 mmol/L — ABNORMAL LOW (ref 22–32)
Calcium: 9 mg/dL (ref 8.9–10.3)
Chloride: 104 mmol/L (ref 101–111)
Creatinine, Ser: 1.19 mg/dL — ABNORMAL HIGH (ref 0.44–1.00)
GFR calc Af Amer: 45 mL/min — ABNORMAL LOW (ref >60)
GFR calc non Af Amer: 39 mL/min — ABNORMAL LOW (ref >60)
Glucose, Bld: 244 mg/dL — ABNORMAL HIGH (ref 65–99)
Potassium: 4.6 mmol/L (ref 3.5–5.1)
Sodium: 133 mmol/L — ABNORMAL LOW (ref 135–145)

## 2016-02-10 LAB — CBC
HCT: 26.2 % — ABNORMAL LOW (ref 36.0–46.0)
Hemoglobin: 8.9 g/dL — ABNORMAL LOW (ref 12.0–15.0)
MCH: 28.8 pg (ref 26.0–34.0)
MCHC: 34 g/dL (ref 30.0–36.0)
MCV: 84.8 fL (ref 78.0–100.0)
Platelets: 292 10*3/uL (ref 150–400)
RBC: 3.09 MIL/uL — ABNORMAL LOW (ref 3.87–5.11)
RDW: 17.7 % — ABNORMAL HIGH (ref 11.5–15.5)
WBC: 9.1 10*3/uL (ref 4.0–10.5)

## 2016-02-10 LAB — URINE CULTURE: Culture: >=100000 — AB

## 2016-02-10 LAB — GLUCOSE, CAPILLARY
Glucose-Capillary: 266 mg/dL — ABNORMAL HIGH (ref 65–99)
Glucose-Capillary: 196 mg/dL — ABNORMAL HIGH (ref 65–99)
Glucose-Capillary: 219 mg/dL — ABNORMAL HIGH (ref 65–99)
Glucose-Capillary: 263 mg/dL — ABNORMAL HIGH (ref 65–99)
Glucose-Capillary: 241 mg/dL — ABNORMAL HIGH (ref 65–99)

## 2016-02-10 LAB — PROTIME-INR
INR: 4.5
Prothrombin Time: 43.9 seconds — ABNORMAL HIGH (ref 11.4–15.2)

## 2016-02-10 MED ORDER — WARFARIN - PHARMACIST DOSING INPATIENT: Freq: Every day | Status: DC

## 2016-02-10 MED ORDER — DEXTROSE 5 % IV SOLN
1.0000 g | INTRAVENOUS | Status: DC
  Administered 2016-02-10 – 2016-02-11 (×2): 1 g via INTRAVENOUS
  Filled 2016-02-10 (×3): qty 10

## 2016-02-10 MED ORDER — DEXTROSE 5 % IV SOLN
1.0000 g | INTRAVENOUS | Status: DC
  Filled 2016-02-10: qty 10

## 2016-02-10 NOTE — Progress Notes (Signed)
PROGRESS NOTE    Shannon [name]  ZOX:[id number] DOB: [birth year] DOA: 02/07/2016 PCP: Kirt Boys, DO   Brief Narrative:  82 yo female with atrial fibrillation on warfarin, presented with shortness of breath and chest pain. Symptoms were associated with nausea, palpitations, nausea and vomiting. On initial evaluation she was found to be febrile, tachycardic and tachypneic, oral mucosa dry, irregulary irregular heart rate. Urine analysis positive for infection. Patient was admitted with sepsis due to urine tract infection.   Assessment & Plan:   Principal Problem:   Sepsis (HCC) Active Problems:   GERD (gastroesophageal reflux disease)   Hypothyroidism   Depression   Diabetes (HCC)   HTN (hypertension), uncontrolled   Dysphagia   Warfarin anticoagulation   UTI (urinary tract infection)   1. Urine tract infection with sepsis. Urine culture positive for E coli, will change antibiotic therapy to ceftriaxone, will continue to follow temperature curve and cell count.   2. Coagulopathy due to vitamin K. Worsening INR now up to 4,5 will continue to follow, for now no signs of bleeding, patient elderly and fall risk, will check in am, if no improving may need low dose vitamin K for reversion.  3. Hypothyroid. Will continue levothyroxine, patient clinically euthyroid.   4.  HTN. Will continue blood pressure control with amlodipine, hydralazine and isosorbide. Blood pressure systolic has been stable. Patient off IV fluids.  5. Atrial fibrillation. Will continue rate control with amiodarone, will continue to hold on warfarin due to supra-therapeutic INR.    6. Depression. Continue sertraline  7. Swallow dysfunction.   8. T2DM. Will continue glucose cover and monitoring with insulin sliding scale, capillary glucose 266-196-219. Patient tolerating tube feedings well.   DVT prophylaxis: scd on warfarin. Code Status: full  Family Communication: I spoke with patient's daughter at the bedside  and all questions were addressed Disposition Plan: Home    Consultants:     Procedures:    Antimicrobials:   Ceftriaxone IV     Subjective: Patient feeling well, at home uses walker for ambulation, no nausea or vomiting. No chest pain.   Objective: Vitals:   02/09/16 2139 02/10/16 0508 02/10/16 0854 02/10/16 1057  BP: 131/72 (!) 144/90  134/71  Pulse: (!) 112 (!) 115    Resp: 17 18    Temp: 98 F (36.7 C) 97.8 F (36.6 C)    TempSrc: Oral Oral    SpO2: 100% 100% 95%   Weight:      Height:        Intake/Output Summary (Last 24 hours) at 02/10/16 1339 Last data filed at 02/10/16 1023  Gross per 24 hour  Intake             1329 ml  Output             1800 ml  Net             -471 ml   Filed Weights   02/08/16 1554  Weight: 59.4 kg (131 lb)    Examination:  General exam: Patient not in pain or dyspnea E ENT: no pallor or icterus. Oral mucosa moist.  Respiratory system: Clear to auscultation. Respiratory effort normal. No wheezing, rales or rhonchi.  Cardiovascular system: S1 & S2 heard, RRR. No JVD, murmurs, rubs, gallops or clicks. No pedal edema. Gastrointestinal system: Abdomen is nondistended, soft and nontender. No organomegaly or masses felt. Normal bowel sounds heard. Central nervous system: Alert and oriented. No focal neurological deficits. Extremities: Symmetric 5 x 5 power.  Skin: No rashes, lesions or ulcers     Data Reviewed: I have personally reviewed following labs and imaging studies  CBC:  Recent Labs Lab 02/07/16 2118 02/08/16 0340 02/09/16 0709 02/10/16 0448  WBC 16.6* 16.0* 8.1 9.1  NEUTROABS 14.9*  --   --   --   HGB 10.7* 10.0* 8.7* 8.9*  HCT 31.0* 29.3* 25.3* 26.2*  MCV 84.5 84.4 84.6 84.8  PLT 339 305 274 292   Basic Metabolic Panel:  Recent Labs Lab 02/07/16 2118 02/08/16 0340 02/09/16 0709 02/10/16 0448  NA 132* 134* 134* 133*  K 4.9 5.1 4.3 4.6  CL 97* 103 107 104  CO2 22 22 20* 21*  GLUCOSE 135* 130* 212*  244*  BUN 81* 63* 40* 37*  CREATININE 1.97* 1.65* 1.30* 1.19*  CALCIUM 9.8 8.9 8.6* 9.0   GFR: Estimated Creatinine Clearance: 29.4 mL/min (by C-G formula based on SCr of 1.19 mg/dL (H)). Liver Function Tests:  Recent Labs Lab 02/07/16 2118  AST 24  ALT 41  ALKPHOS 59  BILITOT 0.5  PROT 7.5  ALBUMIN 3.8   No results for input(s): LIPASE, AMYLASE in the last 168 hours. No results for input(s): AMMONIA in the last 168 hours. Coagulation Profile:  Recent Labs Lab 02/08/16 0340 02/09/16 0709 02/10/16 0448  INR 4.42* 4.43* 4.50*   Cardiac Enzymes:  Recent Labs Lab 02/08/16 0340 02/08/16 0726 02/08/16 1300  TROPONINI 0.03* 0.04* 0.04*   BNP (last 3 results) No results for input(s): PROBNP in the last 8760 hours. HbA1C: No results for input(s): HGBA1C in the last 72 hours. CBG:  Recent Labs Lab 02/09/16 1656 02/09/16 2137 02/10/16 0108 02/10/16 0810 02/10/16 1210  GLUCAP 218* 288* 266* 196* 219*   Lipid Profile: No results for input(s): CHOL, HDL, LDLCALC, TRIG, CHOLHDL, LDLDIRECT in the last 72 hours. Thyroid Function Tests: No results for input(s): TSH, T4TOTAL, FREET4, T3FREE, THYROIDAB in the last 72 hours. Anemia Panel: No results for input(s): VITAMINB12, FOLATE, FERRITIN, TIBC, IRON, RETICCTPCT in the last 72 hours. Sepsis Labs:  Recent Labs Lab 02/07/16 2302 02/08/16 0241 02/08/16 0513  LATICACIDVEN 1.77 1.9 1.5    Recent Results (from the past 240 hour(s))  Urine culture     Status: Abnormal   Collection Time: 02/07/16  9:12 PM  Result Value Ref Range Status   Specimen Description URINE, CLEAN CATCH  Final   Special Requests CX ADDED AT 0049 ON [id number]  Final   Culture >=100,000 COLONIES/mL ESCHERICHIA COLI (A)  Final   Report Status 02/10/2016 FINAL  Final   Organism ID, Bacteria ESCHERICHIA COLI (A)  Final      Susceptibility   Escherichia coli - MIC*    AMPICILLIN >=32 RESISTANT Resistant     CEFAZOLIN <=4 SENSITIVE Sensitive      CEFTRIAXONE <=1 SENSITIVE Sensitive     CIPROFLOXACIN >=4 RESISTANT Resistant     GENTAMICIN <=1 SENSITIVE Sensitive     IMIPENEM <=0.25 SENSITIVE Sensitive     NITROFURANTOIN <=16 SENSITIVE Sensitive     TRIMETH/SULFA <=20 SENSITIVE Sensitive     AMPICILLIN/SULBACTAM 4 SENSITIVE Sensitive     PIP/TAZO <=4 SENSITIVE Sensitive     Extended ESBL NEGATIVE Sensitive     * >=100,000 COLONIES/mL ESCHERICHIA COLI  Blood Culture (routine x 2)     Status: None (Preliminary result)   Collection Time: 02/07/16 11:45 PM  Result Value Ref Range Status   Specimen Description BLOOD RIGHT FOREARM  Final   Special Requests BOTTLES DRAWN AEROBIC  AND ANAEROBIC  Final   Culture NO GROWTH 1 DAY  Final   Report Status PENDING  Incomplete  Blood Culture (routine x 2)     Status: None (Preliminary result)   Collection Time: 02/07/16 11:55 PM  Result Value Ref Range Status   Specimen Description BLOOD LEFT ARM  Final   Special Requests IN PEDIATRIC BOTTLE  Final   Culture NO GROWTH 1 DAY  Final   Report Status PENDING  Incomplete         Radiology Studies: No results found.      Scheduled Meds: . amiodarone  200 mg Oral BID  . amLODipine  10 mg Oral Daily  . atorvastatin  10 mg Oral Daily  . B-complex with vitamin C  1 tablet Oral Daily  . calcium-vitamin D  1 tablet Oral Q breakfast  . ceFEPime (MAXIPIME) IV  1 g Intravenous Q24H  . chlorhexidine  15 mL Mouth/Throat BID  . cycloSPORINE  1 drop Both Eyes BID  . feeding supplement (OSMOLITE 1.5 CAL)  1,000 mL Per Tube Q24H  . fluticasone  2 spray Each Nare Daily  . free water  60 mL Per Tube Q6H  . gabapentin  300 mg Oral TID  . hydrALAZINE  50 mg Oral TID  . insulin aspart  0-9 Units Subcutaneous TID WC  . isosorbide mononitrate  30 mg Oral Daily  . latanoprost  1 drop Both Eyes QHS  . levothyroxine  137 mcg Oral QAC breakfast  . loratadine  10 mg Oral Daily  . mometasone-formoterol  2 puff Inhalation BID  . pantoprazole  40  mg Oral BID  . polyvinyl alcohol  1 drop Both Eyes QID  . predniSONE  20 mg Oral Q breakfast  . sertraline  100 mg Oral Daily   Continuous Infusions:   LOS: 2 days        Shanine Kreiger Annett Gula, MD [facility] Pager [phone]  If 7PM-7AM, please contact night-coverage [email or web address] Password [email or web address] 02/10/2016, 1:39 PM

## 2016-02-10 NOTE — Progress Notes (Signed)
Notify the NP K. Schorr of the blood glucose of 266.  Received an order an care order to continue to monitor

## 2016-02-10 NOTE — Progress Notes (Addendum)
Nutrition Follow-up  DOCUMENTATION CODES:   Non-severe (moderate) malnutrition in context of chronic illness  INTERVENTION:   Continue Osmolite 1.5via GJ tubeat 54ml/h x 12 hours per day(653ml/day) to provide 900kcals, 38gm protein, 491ml free water daily. Provides 62% of estimated kcal needs and 50% of estimated protein needs.  NUTRITION DIAGNOSIS:   Inadequate oral intake related to altered GI function as evidenced by  (G-J tube dependent).  Ongoing  GOAL:   Patient will meet greater than or equal to 90% of their needs  Progressing  MONITOR:   PO intake, Supplement acceptance, Diet advancement, Labs, Weight trends, Skin, I & O's  REASON FOR ASSESSMENT:   Malnutrition Screening Tool    ASSESSMENT:   Shannon [name] is a 82 y.o. female with medical history significant of HTN, asthma/COPD, A. fib on apixiban, CHF, DVT, CVA, gastric outlet obstruction s/p Peg, HLD, hypothyroidism, SIADH, and  anemia; who presented with complaints of shortness of breath and chest pain.  Pt receiving nursing care at time of visit.   Pt on a full liquid diet. Meal completion 100% per doc flowsheets.   Pt underwent G-J tube exchange on 02/08/16. TF resumed on 02/08/16 PM. Pt continues to receive Osmolite 1.5via GJ tubeat 14ml/h x 12 hours per day(613ml/day) to provide 900kcals, 38gm protein, 467ml free water daily. Provides 62% of estimated kcal needs and 50% of estimated protein needs. Staff report pt tolerating TF well.   Labs reviewed: Na: 133, CBGS: 196-266.   Diet Order:  Diet full liquid Room service appropriate? Yes; Fluid consistency: Thin  Skin:  Wound (see comment) (stage II rt and lt medial toe)  Last BM:  02/09/16  Height:   Ht Readings from Last 1 Encounters:  02/08/16 5\' 6"  (1.676 m)    Weight:   Wt Readings from Last 1 Encounters:  02/08/16 131 lb (59.4 kg)    Ideal Body Weight:  59.1 kg  BMI:  Body mass index is 21.14 kg/m.  Estimated Nutritional  Needs:   Kcal:  1450-1650  Protein:  75-90 grams  Fluid:  1.5 L  EDUCATION NEEDS:   No education needs identified at this time  Adaiah Morken A. Jimmye Norman, RD, LDN, CDE Pager: [phone] After hours Pager: [phone]

## 2016-02-10 NOTE — Progress Notes (Signed)
ANTICOAGULATION CONSULT NOTE - Follow Up Consult  Pharmacy Consult for Warfarin Indication: atrial fibrillation and recent DVT  Not on File  Patient Measurements: Height: 5\' 6"  (167.6 cm) (02/02/16 encounter) Weight: 131 lb (59.4 kg) (from 02/02/16 encounter) IBW/kg (Calculated) : 59.3 Heparin Dosing Weight:   Vital Signs: Temp: 97.6 F (36.4 C) (11/09 1358) Temp Source: Axillary (11/09 1358) BP: 146/64 (11/09 1358) Pulse Rate: 70 (11/09 1358)  Labs:  Recent Labs  02/08/16 0340 02/08/16 0726 02/08/16 1300 02/09/16 0709 02/10/16 0448  HGB 10.0*  --   --  8.7* 8.9*  HCT 29.3*  --   --  25.3* 26.2*  PLT 305  --   --  274 292  APTT 52*  --   --   --   --   LABPROT 43.3*  --   --  43.7* 43.9*  INR 4.42*  --   --  4.43* 4.50*  CREATININE 1.65*  --   --  1.30* 1.19*  TROPONINI 0.03* 0.04* 0.04*  --   --     Estimated Creatinine Clearance: 29.4 mL/min (by C-G formula based on SCr of 1.19 mg/dL (H)).   Assessment: 82yo female with AFib and recent DVT on Eliquis, now on warfarin.  INR trending up 4.43 > 4.5.  Hg stable, pltc wnl.  No bleeding noted. Pt NPO 11/7, full liquid 11/8, now normal diet via GJ-tube. Pt on amiodarone prior to admission.  PTA dose = warfarin 5 mg daily  Goal of Therapy:  INR 2-3 Monitor platelets by anticoagulation protocol: Yes   Plan:  Hold Warfarin today Daily INR, CBC Monitor s/s of bleeding, diet, DDI   [place] Student

## 2016-02-10 NOTE — Progress Notes (Signed)
CRITICAL VALUE ALERT  Critical value received:  INR 4.5  Date of notification:  02/10/2016   Time of notification:  0655   Critical value read back:Yes.    Nurse who received alert:  Benay Pike  MD notified (1st page):  NP K. Schorr  Time of first page:  [phone]  MD notified (2nd page):  Time of second page:  Responding MD:   Time MD responded:   *notified the day shift nurse to follow up

## 2016-02-11 DIAGNOSIS — E039 Hypothyroidism, unspecified: Secondary | ICD-10-CM

## 2016-02-11 DIAGNOSIS — Z7901 Long term (current) use of anticoagulants: Secondary | ICD-10-CM

## 2016-02-11 LAB — BASIC METABOLIC PANEL
Anion gap: 9 (ref 5–15)
BUN: 36 mg/dL — ABNORMAL HIGH (ref 6–20)
CO2: 20 mmol/L — ABNORMAL LOW (ref 22–32)
Calcium: 9.1 mg/dL (ref 8.9–10.3)
Chloride: 104 mmol/L (ref 101–111)
Creatinine, Ser: 1.17 mg/dL — ABNORMAL HIGH (ref 0.44–1.00)
GFR calc Af Amer: 46 mL/min — ABNORMAL LOW (ref >60)
GFR calc non Af Amer: 40 mL/min — ABNORMAL LOW (ref >60)
Glucose, Bld: 210 mg/dL — ABNORMAL HIGH (ref 65–99)
Potassium: 4.5 mmol/L (ref 3.5–5.1)
Sodium: 133 mmol/L — ABNORMAL LOW (ref 135–145)

## 2016-02-11 LAB — CBC WITH DIFFERENTIAL/PLATELET
Basophils Absolute: 0 10*3/uL (ref 0.0–0.1)
Basophils Relative: 0 %
Eosinophils Absolute: 0 10*3/uL (ref 0.0–0.7)
Eosinophils Relative: 0 %
HCT: 27.7 % — ABNORMAL LOW (ref 36.0–46.0)
Hemoglobin: 9.5 g/dL — ABNORMAL LOW (ref 12.0–15.0)
Lymphocytes Relative: 10 %
Lymphs Abs: 1 10*3/uL (ref 0.7–4.0)
MCH: 29.1 pg (ref 26.0–34.0)
MCHC: 34.3 g/dL (ref 30.0–36.0)
MCV: 84.7 fL (ref 78.0–100.0)
Monocytes Absolute: 0.7 10*3/uL (ref 0.1–1.0)
Monocytes Relative: 7 %
Neutro Abs: 8.3 10*3/uL — ABNORMAL HIGH (ref 1.7–7.7)
Neutrophils Relative %: 83 %
Platelets: 310 10*3/uL (ref 150–400)
RBC: 3.27 MIL/uL — ABNORMAL LOW (ref 3.87–5.11)
RDW: 17.5 % — ABNORMAL HIGH (ref 11.5–15.5)
WBC: 10 10*3/uL (ref 4.0–10.5)

## 2016-02-11 LAB — GLUCOSE, CAPILLARY
Glucose-Capillary: 213 mg/dL — ABNORMAL HIGH (ref 65–99)
Glucose-Capillary: 211 mg/dL — ABNORMAL HIGH (ref 65–99)
Glucose-Capillary: 286 mg/dL — ABNORMAL HIGH (ref 65–99)
Glucose-Capillary: 196 mg/dL — ABNORMAL HIGH (ref 65–99)

## 2016-02-11 LAB — PROTIME-INR
INR: 3.95
Prothrombin Time: 39.6 seconds — ABNORMAL HIGH (ref 11.4–15.2)

## 2016-02-11 MED ORDER — WARFARIN SODIUM 3 MG PO TABS
3.0000 mg | ORAL_TABLET | Freq: Once | ORAL | Status: AC
  Administered 2016-02-11: 3 mg via ORAL
  Filled 2016-02-11: qty 1

## 2016-02-11 NOTE — Progress Notes (Signed)
PROGRESS NOTE    Shannon [name]  NAT:[id number] DOB: 17-Aug-1923 DOA: 02/07/2016 PCP: Kirt Boys, DO   Brief Narrative:  82 yo female with atrial fibrillation on warfarin, presented with shortness of breath and chest pain. Symptoms were associated with nausea, palpitations, nausea and vomiting. On initial evaluation she was found to be febrile, tachycardic and tachypneic, oral mucosa dry, irregulary irregular heart rate. Urine analysis positive for infection. Patient was admitted with sepsis due to urine tract infection.  Assessment & Plan:   Principal Problem:   Sepsis (HCC) Active Problems:   GERD (gastroesophageal reflux disease)   Hypothyroidism   Depression   Diabetes (HCC)   HTN (hypertension), uncontrolled   Dysphagia   Warfarin anticoagulation   UTI (urinary tract infection)  1. Urine tract infection with sepsis. Urine culture due to  E coli, patient tolerating well ceftriaxone, wbc at 10.0, patient has remained afebrile.   2. Coagulopathy due to vitamin K. INR down to 3.95 from 4,5, will continue to follow inr in am, continue to hold on warfarin, patient with ambulatory dysfunction and fall risk, will have physical therapy evaluate patient. Will wait for lower INR before discharge, home.   3. Hypothyroid. Continue levothyroxine. Continue prednisone per home regimen. May consider a slow taper.   4.  HTN. Blood pressure control with amlodipine, hydralazine and isosorbide. Blood pressure systolic 118.   5. Atrial fibrillation. Rate control with amiodarone, will continue to hold on warfarin due to supra-therapeutic INR, target INR 2 to 3.     6. Depression. Continue sertraline, no confusion or agitation.   7. Swallow dysfunction. Patient has a peg tube that uses at night for nutritional support. During the day, po intake.   8. T2DM. Glucose cover and monitoring with insulin sliding scale, capillary glucose [phone] .   DVT prophylaxis: scd on warfarin. Code  Status: full  Family Communication: I spoke with patient's daughter at the bedside and all questions were addressed Disposition Plan: Home   Procedures:   Antimicrobials:    Subjective: Patient with no chest pain or dyspnea, needs assistance for getting out of the bed. Patient at home gets out of bed in the night to the commode up to 3 times. No nausea or vomiting. Occasional bowel incontinence.   Objective: Vitals:   02/10/16 2320 02/11/16 0454 02/11/16 0751 02/11/16 0953  BP:  (!) 143/54  118/87  Pulse:  77  61  Resp:  20    Temp:  97.6 F (36.4 C)    TempSrc:  Oral    SpO2: 98% 100% 100% 100%  Weight:      Height:        Intake/Output Summary (Last 24 hours) at 02/11/16 1239 Last data filed at 02/11/16 1008  Gross per 24 hour  Intake              905 ml  Output             2050 ml  Net            -1145 ml   Filed Weights   02/08/16 1554  Weight: 59.4 kg (131 lb)    Examination:  General exam: not in pain or dypsnea E ENT: no pallor or icterus. Oral mucosa moist. Respiratory system: Clear to auscultation. Respiratory effort normal. No wheezing, rales or rhonchi.  Cardiovascular system: S1 & S2 heard, RRR. No JVD, murmurs, rubs, gallops or clicks. No pedal edema. Gastrointestinal system: Abdomen is nondistended, soft and nontender. No organomegaly or  masses felt. Normal bowel sounds heard. Central nervous system: Alert and oriented. No focal neurological deficits. Extremities: Symmetric 5 x 5 power. Skin: No rashes, lesions or ulcers     Data Reviewed: I have personally reviewed following labs and imaging studies  CBC:  Recent Labs Lab 02/07/16 2118 02/08/16 0340 02/09/16 0709 02/10/16 0448 02/11/16 0558  WBC 16.6* 16.0* 8.1 9.1 10.0  NEUTROABS 14.9*  --   --   --  8.3*  HGB 10.7* 10.0* 8.7* 8.9* 9.5*  HCT 31.0* 29.3* 25.3* 26.2* 27.7*  MCV 84.5 84.4 84.6 84.8 84.7  PLT 339 305 274 292 310   Basic Metabolic Panel:  Recent Labs Lab  02/07/16 2118 02/08/16 0340 02/09/16 0709 02/10/16 0448 02/11/16 0558  NA 132* 134* 134* 133* 133*  K 4.9 5.1 4.3 4.6 4.5  CL 97* 103 107 104 104  CO2 22 22 20* 21* 20*  GLUCOSE 135* 130* 212* 244* 210*  BUN 81* 63* 40* 37* 36*  CREATININE 1.97* 1.65* 1.30* 1.19* 1.17*  CALCIUM 9.8 8.9 8.6* 9.0 9.1   GFR: Estimated Creatinine Clearance: 29.9 mL/min (by C-G formula based on SCr of 1.17 mg/dL (H)). Liver Function Tests:  Recent Labs Lab 02/07/16 2118  AST 24  ALT 41  ALKPHOS 59  BILITOT 0.5  PROT 7.5  ALBUMIN 3.8   No results for input(s): LIPASE, AMYLASE in the last 168 hours. No results for input(s): AMMONIA in the last 168 hours. Coagulation Profile:  Recent Labs Lab 02/08/16 0340 02/09/16 0709 02/10/16 0448 02/11/16 0558  INR 4.42* 4.43* 4.50* 3.95   Cardiac Enzymes:  Recent Labs Lab 02/08/16 0340 02/08/16 0726 02/08/16 1300  TROPONINI 0.03* 0.04* 0.04*   BNP (last 3 results) No results for input(s): PROBNP in the last 8760 hours. HbA1C: No results for input(s): HGBA1C in the last 72 hours. CBG:  Recent Labs Lab 02/10/16 1210 02/10/16 1708 02/10/16 2115 02/11/16 0812 02/11/16 1224  GLUCAP 219* 263* 241* 213* 211*   Lipid Profile: No results for input(s): CHOL, HDL, LDLCALC, TRIG, CHOLHDL, LDLDIRECT in the last 72 hours. Thyroid Function Tests: No results for input(s): TSH, T4TOTAL, FREET4, T3FREE, THYROIDAB in the last 72 hours. Anemia Panel: No results for input(s): VITAMINB12, FOLATE, FERRITIN, TIBC, IRON, RETICCTPCT in the last 72 hours. Sepsis Labs:  Recent Labs Lab 02/07/16 2302 02/08/16 0241 02/08/16 0513  LATICACIDVEN 1.77 1.9 1.5    Recent Results (from the past 240 hour(s))  Urine culture     Status: Abnormal   Collection Time: 02/07/16  9:12 PM  Result Value Ref Range Status   Specimen Description URINE, CLEAN CATCH  Final   Special Requests CX ADDED AT 0049 ON [id number]  Final   Culture >=100,000 COLONIES/mL ESCHERICHIA  COLI (A)  Final   Report Status 02/10/2016 FINAL  Final   Organism ID, Bacteria ESCHERICHIA COLI (A)  Final      Susceptibility   Escherichia coli - MIC*    AMPICILLIN >=32 RESISTANT Resistant     CEFAZOLIN <=4 SENSITIVE Sensitive     CEFTRIAXONE <=1 SENSITIVE Sensitive     CIPROFLOXACIN >=4 RESISTANT Resistant     GENTAMICIN <=1 SENSITIVE Sensitive     IMIPENEM <=0.25 SENSITIVE Sensitive     NITROFURANTOIN <=16 SENSITIVE Sensitive     TRIMETH/SULFA <=20 SENSITIVE Sensitive     AMPICILLIN/SULBACTAM 4 SENSITIVE Sensitive     PIP/TAZO <=4 SENSITIVE Sensitive     Extended ESBL NEGATIVE Sensitive     * >=100,000 COLONIES/mL ESCHERICHIA COLI  Blood Culture (routine x 2)     Status: None (Preliminary result)   Collection Time: 02/07/16 11:45 PM  Result Value Ref Range Status   Specimen Description BLOOD RIGHT FOREARM  Final   Special Requests BOTTLES DRAWN AEROBIC AND ANAEROBIC  Final   Culture NO GROWTH 2 DAYS  Final   Report Status PENDING  Incomplete  Blood Culture (routine x 2)     Status: None (Preliminary result)   Collection Time: 02/07/16 11:55 PM  Result Value Ref Range Status   Specimen Description BLOOD LEFT ARM  Final   Special Requests IN PEDIATRIC BOTTLE  Final   Culture NO GROWTH 2 DAYS  Final   Report Status PENDING  Incomplete         Radiology Studies: No results found.      Scheduled Meds: . amiodarone  200 mg Oral BID  . amLODipine  10 mg Oral Daily  . atorvastatin  10 mg Oral Daily  . B-complex with vitamin C  1 tablet Oral Daily  . calcium-vitamin D  1 tablet Oral Q breakfast  . cefTRIAXone (ROCEPHIN)  IV  1 g Intravenous Q24H  . chlorhexidine  15 mL Mouth/Throat BID  . cycloSPORINE  1 drop Both Eyes BID  . feeding supplement (OSMOLITE 1.5 CAL)  1,000 mL Per Tube Q24H  . fluticasone  2 spray Each Nare Daily  . free water  60 mL Per Tube Q6H  . gabapentin  300 mg Oral TID  . hydrALAZINE  50 mg Oral TID  . insulin aspart  0-9 Units  Subcutaneous TID WC  . isosorbide mononitrate  30 mg Oral Daily  . latanoprost  1 drop Both Eyes QHS  . levothyroxine  137 mcg Oral QAC breakfast  . loratadine  10 mg Oral Daily  . mometasone-formoterol  2 puff Inhalation BID  . pantoprazole  40 mg Oral BID  . polyvinyl alcohol  1 drop Both Eyes QID  . predniSONE  20 mg Oral Q breakfast  . sertraline  100 mg Oral Daily  . warfarin  3 mg Oral ONCE-1800  . Warfarin - Pharmacist Dosing Inpatient   Does not apply q1800   Continuous Infusions:   LOS: 3 days       Gauge Winski Annett Gula, MD [facility] Pager [phone]  If 7PM-7AM, please contact night-coverage [email or web address] Password [email or web address] 02/11/2016, 12:39 PM

## 2016-02-11 NOTE — Progress Notes (Signed)
ANTICOAGULATION CONSULT NOTE - Follow Up Consult  Pharmacy Consult for Warfarin Indication: atrial fibrillation and recent DVT  Patient Measurements: Height: 5\' 6"  (167.6 cm) (02/02/16 encounter) Weight: 131 lb (59.4 kg) (from 02/02/16 encounter) IBW/kg (Calculated) : 59.3  Vital Signs: Temp: 97.6 F (36.4 C) (11/10 0454) Temp Source: Oral (11/10 0454) BP: 143/54 (11/10 0454) Pulse Rate: 77 (11/10 0454)  Labs:  Recent Labs  02/08/16 1300  02/09/16 0709 02/10/16 0448 02/11/16 0558  HGB  --   < > 8.7* 8.9* 9.5*  HCT  --   --  25.3* 26.2* 27.7*  PLT  --   --  274 292 310  LABPROT  --   --  43.7* 43.9* 39.6*  INR  --   --  4.43* 4.50* 3.95  CREATININE  --   --  1.30* 1.19* 1.17*  TROPONINI 0.04*  --   --   --   --   < > = values in this interval not displayed.  Estimated Creatinine Clearance: 29.9 mL/min (by C-G formula based on SCr of 1.17 mg/dL (H)).   Assessment: 82yo female with AFib and recent DVT on Eliquis, now on warfarin.  INR now trending down 4.5>3.95.  Hg stable, pltc wnl.  No bleeding noted. Pt NPO 11/7, full liquid 11/8, now normal diet via GJ-tube. Pt on amiodarone prior to admission. Will restart a small dose today in anticipation the INR will continue to drop significantly now that the patient is back on a regular diet.  PTA dose = warfarin 5 mg daily  Goal of Therapy:  INR 2-3 Monitor platelets by anticoagulation protocol: Yes   Plan:  Give warfarin 3 mg po x 1 Monitor daily INR, CBC, clinical course, s/sx of bleed, PO intake, DDI   Thank you for allowing [place] to participate in this patients care. Jens Som, PharmD Pager: [phone]

## 2016-02-11 NOTE — Consult Note (Signed)
[facility] CM Inpatient Consult   02/11/2016  Chera Fladung Apr 17, 1923 [id number]   Came by to see if her daughter was available.  Patient states," She has already gone but I gave her your brochure and told her to call you."  No more questions or changes.  Please contact:  Charlesetta Shanks, RN BSN CCM [facility]  [phone] business mobile phone Toll free office [phone]

## 2016-02-12 DIAGNOSIS — R1310 Dysphagia, unspecified: Secondary | ICD-10-CM

## 2016-02-12 LAB — PROTIME-INR
INR: 3.39
INR: 3.27
Prothrombin Time: 35.1 s — ABNORMAL HIGH (ref 11.4–15.2)
Prothrombin Time: 34 seconds — ABNORMAL HIGH (ref 11.4–15.2)

## 2016-02-12 LAB — GLUCOSE, CAPILLARY
Glucose-Capillary: 220 mg/dL — ABNORMAL HIGH (ref 65–99)
Glucose-Capillary: 312 mg/dL — ABNORMAL HIGH (ref 65–99)

## 2016-02-12 MED ORDER — WARFARIN SODIUM 3 MG PO TABS
3.0000 mg | ORAL_TABLET | Freq: Once | ORAL | Status: DC
  Filled 2016-02-12: qty 1

## 2016-02-12 MED ORDER — WARFARIN SODIUM 5 MG PO TABS: 2.5000 mg | ORAL_TABLET | Freq: Once | ORAL | 12 refills | Status: DC

## 2016-02-12 MED ORDER — CEPHALEXIN 500 MG PO CAPS: 500.0000 mg | ORAL_CAPSULE | Freq: Two times a day (BID) | ORAL | 0 refills | Status: DC

## 2016-02-12 NOTE — Discharge Summary (Signed)
Physician Discharge Summary  Shannon [name] HYQ:[id number] DOB: 09/05/1923 DOA: 02/07/2016  PCP: Kirt Boys, DO  Admit date: 02/07/2016 Discharge date: 02/12/2016  Admitted From:  Home  Disposition:  Home with home health   Recommendations for Outpatient Follow-up:  1. Follow up with PCP in 1- week 2. Please obtain INR on 02/14/2016 3. Patient will hold on warfarin until INR check 4.   Patient will need 4 more days of antibiotic therapy with Keflex for e coli urine infection  Home Health: Yes Equipment/Devices: Na    Discharge Condition: stable  CODE STATUS: Partial ( Ok for noninvasive mechanical ventilation, Ok for acls medications.  Diet recommendation: Heart Healthy / Carb Modified   Brief/Interim Summary: This is a 82 year old female who presented to the hospital with the chief complaint of shortness of breath and chest pain. Apparently the day of her admission her PEG tube was evaluated in the emergency room and then she was discharged home, later that afternoon she developed chest pain, associated with shortness of breath, palpitations, nausea and vomiting. On her initial physical examination she was febrile 100.4, heart rate 105 -116, respiratory rate 14-26, pulse oximetry 100% on supplemental oxygen. Her mucous membranes were dry, her lungs had decreased breath sounds bilaterally with expiratory wheezing, heart S1-S2 present irregularly irregular, her abdomen was soft nontender, PEG tube was in place, positive bowel sounds, lower extremities no significant edema. Her sodium was 132, potassium 4.9, creatinine 1.97, BUN 81, glucose 135, white count 16.6, hemoglobin 10.7 hematocrit 31.0, platelets 339. Her urinalysis had too numerous to count white cells, positive nitrates. Her chest x-ray was clear for infiltrates, abdominal film showed gastrojejunostomy tube colied in the stomach with no contrast extravasation. Her EKG showed atrial tachycardia.   The patient was admitted to hospital  with working diagnosis of sepsis due to urinary tract infection complicated by acute kidney injury on chronic kidney disease stage III.  1. Sepsis, due to urinary infection, present on admission. Patient was admitted to hospital, placed on a telemetry monitor, she received IV fluids and broad-spectrum antibiotics. Her leukocytosis improved, the urine culture grew Escherichia coli sensitive to cephalosporins, she was changed to IV ceftriaxone and she will be discharged with by mouth cephalexin for 4 more days.   2. Atrial fibrillation, paroxysmal. Patient remained rate controlled with amiodarone. Her warfarin was held due to supratherapeutic INR. Recommendation to hold warfarin until recheck INR next week.   3. Hypothyroidism. She was continued on levothyroxine  4. Depression. Patient was continued on sertraline  5. Swallow dysfunction. Patient has been tolerating well tube feeds, she usually gets to fits at night. No further nausea or vomiting. No clinical signs of feeding tube malfunction. Recommend to close follow-up as an outpatient, home health and home nursing will be arranged.   6. Type II tablets mellitus. Patient was placed on insulin sliding scale for glucose monitoring, capillary glucose 196 to 286 over last 24 hours. She will resume exenatide at discharge.   7. COPD. Patient will resume her bronchodilator therapy, oximetry was 99 on room air by the time of discharge. Patient will continue home dose of prednisone 10 mg daily, will recommend slow taper. Continue advair.   8. Hypertension. Patient was continued on amlodipine 10 mg daily, hydralazine 50 g 3 times daily and isosorbide.  9. Acute kidney injury on chronic kidney disease stage III. Patient's kidney function improved, discharge creatinine 1.17 with a potassium of 4.5 and serum bicarbonate 20. Acute kidney injury was presumed to be prerenal  related to sepsis.     Discharge Diagnoses:  Principal Problem:   Sepsis  (HCC) Active Problems:   GERD (gastroesophageal reflux disease)   Hypothyroidism   Depression   Diabetes (HCC)   HTN (hypertension), uncontrolled   Dysphagia   Warfarin anticoagulation   UTI (urinary tract infection)    Discharge Instructions  Discharge Instructions    Diet - low sodium heart healthy    Complete by:  As directed    Increase activity slowly    Complete by:  As directed        Medication List    TAKE these medications   acetaminophen 325 MG tablet Commonly known as:  TYLENOL Take 650 mg by mouth 2 (two) times daily as needed for mild pain.   ADVAIR DISKUS 250-50 MCG/DOSE Aepb Generic drug:  Fluticasone-Salmeterol INHALE 1 PUFF INTO THE LUNGS 2 (TWO) TIMES DAILY AS NEEDED FOR SHORTNESS OF BREATH   AMINO ACIDS-PROTEIN HYDROLYS PO Take 30 mLs by mouth 2 (two) times daily.   amiodarone 200 MG tablet Commonly known as:  PACERONE Take 1 tablet (200 mg total) by mouth 2 (two) times daily. What changed:  Another medication with the same name was removed. Continue taking this medication, and follow the directions you see here.   amLODipine 10 MG tablet Commonly known as:  NORVASC TAKE 1 TABLET BY MOUTH DAILY FOR HYPERTENSION What changed:  See the new instructions.   atorvastatin 10 MG tablet Commonly known as:  LIPITOR Take 10 mg by mouth daily.   b complex vitamins tablet Take 1 tablet by mouth daily.   bisacodyl 10 MG suppository Commonly known as:  DULCOLAX Place 1 suppository (10 mg total) rectally daily as needed for moderate constipation.   calcium-vitamin D 500-200 MG-UNIT tablet Commonly known as:  OSCAL WITH D Take 1 tablet by mouth daily with breakfast.   cephALEXin 500 MG capsule Commonly known as:  KEFLEX Take 1 capsule (500 mg total) by mouth 2 (two) times daily.   cetirizine 10 MG tablet Commonly known as:  ZYRTEC Take 10 mg by mouth daily as needed for allergies or rhinitis.   chlorhexidine 0.12 % solution Commonly known as:   PERIDEX Use as directed 15 mLs in the mouth or throat 2 (two) times daily.   diclofenac sodium 1 % Gel Commonly known as:  VOLTAREN Apply 2 g topically daily as needed (for pain).   exenatide 5 MCG/0.02ML Sopn injection Commonly known as:  BYETTA 5 MCG PEN Inject 0.02 mLs (5 mcg total) into the skin 2 (two) times daily with a meal.   feeding supplement (OSMOLITE 1.5 CAL) Liqd Place 1,000 mLs into feeding tube daily. What changed:  additional instructions   feeding supplement Liqd Take 1 Container by mouth daily. What changed:  when to take this   fluticasone 50 MCG/ACT nasal spray Commonly known as:  FLONASE Place 2 sprays into both nostrils daily.   free water Soln Place 60 mLs into feeding tube every 6 (six) hours.   gabapentin 300 MG capsule Commonly known as:  NEURONTIN Take 1 capsule (300 mg total) by mouth 3 (three) times daily.   hydrALAZINE 50 MG tablet Commonly known as:  APRESOLINE Take 1 tablet (50 mg total) by mouth 3 (three) times daily.   ipratropium-albuterol 0.5-2.5 (3) MG/3ML Soln Commonly known as:  DUONEB Take 3 mLs by nebulization every 6 (six) hours as needed (sob). Use 3 times daily x 4 days then every 6 hours as needed. What  changed:  reasons to take this  additional instructions   isosorbide mononitrate 30 MG 24 hr tablet Commonly known as:  IMDUR TAKE 1 TABLET (30 MG TOTAL) BY MOUTH DAILY.   latanoprost 0.005 % ophthalmic solution Commonly known as:  XALATAN Place 1 drop into both eyes at bedtime.   levalbuterol 45 MCG/ACT inhaler Commonly known as:  XOPENEX HFA Inhale 2 puffs into the lungs every 6 (six) hours as needed for wheezing or shortness of breath.   levothyroxine 137 MCG tablet Commonly known as:  SYNTHROID, LEVOTHROID Take 1 tablet (137 mcg total) by mouth daily before breakfast.   multivitamin with minerals Tabs tablet Take 1 tablet by mouth daily.   NONFORMULARY OR COMPOUNDED ITEM Apply 1-2 g topically 4 (four)  times daily. Peripheral Neuropathy cream: Bupivacaine 1% Doxepin 3% Gabapentin 6% Ibuprofen 3% Pentoxifylline 3%   ondansetron 4 MG tablet Commonly known as:  ZOFRAN Take 1 tablet (4 mg total) by mouth every 8 (eight) hours as needed for nausea or vomiting.   pantoprazole 40 MG tablet Commonly known as:  PROTONIX TAKE 1 TABLET BY MOUTH TWICE A DAY What changed:  See the new instructions.   Polyethyl Glycol-Propyl Glycol 0.4-0.3 % Soln Place 1 drop into both eyes 4 (four) times daily.   polyethylene glycol powder powder Commonly known as:  GLYCOLAX/MIRALAX MIX 17GRAMS IN 8 OUNCES OF LIQUID AND DRINK TWICE DAILY AS NEEDED What changed:  See the new instructions.   predniSONE 10 MG tablet Commonly known as:  DELTASONE Take 1 tablet (10 mg total) by mouth daily with breakfast. What changed:  Another medication with the same name was removed. Continue taking this medication, and follow the directions you see here.   RESOURCE THICKENUP CLEAR Powd Take 120 g by mouth as needed (use to thicken liquids.). What changed:  how much to take  how to take this  when to take this  additional instructions   RESTASIS 0.05 % ophthalmic emulsion Generic drug:  cycloSPORINE USE 1 DROP INTO BOTH EYES TWICE DAILY   sertraline 100 MG tablet Commonly known as:  ZOLOFT Take one tablet by mouth once daily for depression What changed:  how much to take  how to take this  when to take this  additional instructions   simethicone 80 MG chewable tablet Commonly known as:  MYLICON Chew 80 mg by mouth 4 (four) times daily as needed for flatulence.   traMADol 50 MG tablet Commonly known as:  ULTRAM Take 50 mg by mouth daily as needed for moderate pain.   warfarin 5 MG tablet Commonly known as:  COUMADIN Take 0.5 tablets (2.5 mg total) by mouth one time only at 6 PM. Please resume taking after INR/ protime check. What changed:  how much to take  additional instructions       Follow-up Information    Kirt Boys, DO Follow up in 1 week(s).   Specialty:  Internal Medicine Contact information: [address] [place] [address] [phone]          Not on File  Consultations:     Procedures/Studies: Dg Chest 2 View  Result Date: 02/07/2016 CLINICAL DATA:  Left-sided chest pain and pressure EXAM: CHEST  2 VIEW COMPARISON:  Chest radiograph 01/28/2016 FINDINGS: There is persistent atherosclerotic calcification in the aortic arch. Cardiomediastinal contours are otherwise normal. No pneumothorax or sizable pleural effusion. No pulmonary edema or focal airspace consolidation. IMPRESSION: No active cardiopulmonary disease. Aortic atherosclerosis. Electronically Signed   By: Caryn Bee  Chase Picket M.D.   On: 02/07/2016 22:15   Ir Gj Tube Change  Result Date: 02/08/2016 INDICATION: Chronic GJ tube, coiled in the stomach EXAM: JEJUNAL CATHETER REPLACEMENT MEDICATIONS: None. ANESTHESIA/SEDATION: None immediate CONTRAST:  20 cc - administered into the gastric lumen. FLUOROSCOPY TIME:  Fluoroscopy Time: 11 minutes 12 seconds (24 mGy). COMPLICATIONS: None immediate. PROCEDURE: Informed written consent was obtained from the patient after a thorough discussion of the procedural risks, benefits and alternatives. All questions were addressed. Maximal Sterile Barrier Technique was utilized including caps, mask, sterile gowns, sterile gloves, sterile drape, hand hygiene and skin antiseptic. A timeout was performed prior to the initiation of the procedure. The existing GJ tube coiled within the stomach was removed over a Bentson guidewire. C2 catheter and Glidewire were utilized to manipulate the access into the proximal jejunum. Over the glidewire, a new gastrojejunostomy tube was advanced with the jejunal tube in the proximal jejunum. The gastrostomy port confirmed in the stomach. Gastric retention balloon inflated with 7 cc saline containing 1 cc contrast. Images obtained for  documentation. Feeding tube ready for use. IMPRESSION: Successful fluoroscopic exchange of the gastrojejunostomy. Electronically Signed   By: Judie Petit.  Shick M.D.   On: 02/08/2016 15:28   Ir Mech Vito Berger Obstruc Mat Any Colon Tube W/fluoro  Result Date: 01/27/2016 INDICATION: The jejunal portion of the GJ tube is occluded. EXAM: MECH REMOVAL GI TUBE OBSTRUCTION MEDICATIONS: None ANESTHESIA/SEDATION: None CONTRAST:  None FLUOROSCOPY TIME:  None COMPLICATIONS: None immediate. PROCEDURE: The jejunal tube was occluded. Adaptor piece was placed at the end of the jejunal tube and this tube was flushed with a 10 mL syringe by the IR technologist. The tube eventually cleared using this technique. The jejunal tube was flushed with saline. The gastric tube was also flushed to ensure patency. IMPRESSION: The jejunal portion of the GJ tube was successfully unclogged. Electronically Signed   By: Richarda Overlie M.D.   On: 01/27/2016 15:16   Dg Chest Port 1 View  Result Date: 01/28/2016 CLINICAL DATA:  Wheezing and short of breath EXAM: PORTABLE CHEST 1 VIEW COMPARISON:  01/24/2016 FINDINGS: Elevated right hemidiaphragm is chronic and unchanged. Heart size mildly enlarged. Negative for heart failure. Negative for pneumonia. Small left pleural effusion unchanged from the prior study. Atherosclerotic calcification aortic arch IMPRESSION: Chronic elevation right hemidiaphragm. Negative for heart failure or pneumonia. Small left pleural effusion or scarring unchanged. Electronically Signed   By: Marlan Palau M.D.   On: 01/28/2016 10:51   Dg Chest Port 1 View  Result Date: 01/24/2016 CLINICAL DATA:  Shortness of breath. EXAM: PORTABLE CHEST 1 VIEW COMPARISON:  11/09/2015. FINDINGS: Trachea is midline. Heart size stable. Thoracic aorta is calcified. Lungs are clear. No pleural fluid. Right hemidiaphragm is elevated. Degenerative changes are seen in the glenohumeral joints bilaterally. IMPRESSION: No acute findings. Electronically  Signed   By: Leanna Battles M.D.   On: 01/24/2016 17:13   Dg Abd Portable 1v  Result Date: 02/08/2016 CLINICAL DATA:  Peg tube placement.  Abdominal pain. EXAM: PORTABLE ABDOMEN - 1 VIEW COMPARISON:  01/03/2016 FINDINGS: Percutaneous gastrojejunostomy tube demonstrated in the left upper quadrant. Contrast injected into the tube fills the stomach. No contrast extravasation. The retention balloon is demonstrated along the greater curvature of the stomach. The tubing is coiled in the stomach. No small or large bowel distention. Scattered stool in the colon. Surgical clips in the right upper quadrant. Atelectasis in the lung bases. IMPRESSION: Gastrojejunostomy tube remains coiled in the stomach. No contrast extravasation. Electronically  Signed   By: Burman Nieves M.D.   On: 02/08/2016 02:33   Ir Patient Eval Tech 0-60 Mins  Result Date: 02/07/2016 Daryel November     02/07/2016  3:08 PM Patient came in with complaint if red, painful insertion site of GJ tube.  Patient's daughter was concerned that the tube had become too tight.  The skin did appear slightly red and moist. There was minimal granuloma tissue but the patient appeared very affected by it.  Jeananne Rama  PA, inspected the skin site as well and applied silver nitrate to the granuloma tissue.  The patient and her daughter were advised to apply a barrier cream or vasoline and a single layer of guaze to protect the skin under the bumper.  The patient and daughter understood the instructions and wiill contact us if there are any further concerns  Ct Maxillofacial Ltd Wo Cm  Result Date: 01/27/2016 CLINICAL DATA:  Pain behind left eye and left year. EXAM: CT PARANASAL SINUS LIMITED WITHOUT CONTRAST TECHNIQUE: Non-contiguous multidetector CT images of the paranasal sinuses were obtained in a single plane without contrast. COMPARISON:  Head CT 11/06/2015 FINDINGS: There is no paranasal sinus mucosal thickening. The sinus drainage pathways are patent.  There is a right concha bullosa. Unremarkable visualized intracranial structures. Atherosclerotic calcification within the proximal intracranial internal carotid arteries and vertebral arteries. Normal appearance of the orbits. IMPRESSION: No evidence of acute or chronic sinusitis. Electronically Signed   By: Deatra Robinson M.D.   On: 01/27/2016 18:16       Subjective: Patient feeling well, out of bed to chair, no nausea or vomiting.   Discharge Exam: Vitals:   02/11/16 2204 02/12/16 0615  BP: (!) 148/83 (!) 141/56  Pulse: 87 (!) 114  Resp: 18 (!) 22  Temp: 98.1 F (36.7 C) 98 F (36.7 C)   Vitals:   02/11/16 2025 02/11/16 2204 02/12/16 0615 02/12/16 0820  BP:  (!) 148/83 (!) 141/56   Pulse:  87 (!) 114   Resp:  18 (!) 22   Temp:  98.1 F (36.7 C) 98 F (36.7 C)   TempSrc:  Oral Oral   SpO2: 100% 100% 100% 99%  Weight:      Height:        General: Pt is alert, awake, not in acute distress Cardiovascular: RRR, S1/S2 +, no rubs, no gallops Respiratory: CTA bilaterally, no wheezing, no rhonchi Abdominal: Soft, NT, ND, bowel sounds + Extremities: no edema, no cyanosis    The results of significant diagnostics from this hospitalization (including imaging, microbiology, ancillary and laboratory) are listed below for reference.     Microbiology: Recent Results (from the past 240 hour(s))  Urine culture     Status: Abnormal   Collection Time: 02/07/16  9:12 PM  Result Value Ref Range Status   Specimen Description URINE, CLEAN CATCH  Final   Special Requests CX ADDED AT 0049 ON [id number]  Final   Culture >=100,000 COLONIES/mL ESCHERICHIA COLI (A)  Final   Report Status 02/10/2016 FINAL  Final   Organism ID, Bacteria ESCHERICHIA COLI (A)  Final      Susceptibility   Escherichia coli - MIC*    AMPICILLIN >=32 RESISTANT Resistant     CEFAZOLIN <=4 SENSITIVE Sensitive     CEFTRIAXONE <=1 SENSITIVE Sensitive     CIPROFLOXACIN >=4 RESISTANT Resistant     GENTAMICIN <=1  SENSITIVE Sensitive     IMIPENEM <=0.25 SENSITIVE Sensitive     NITROFURANTOIN <=16 SENSITIVE  Sensitive     TRIMETH/SULFA <=20 SENSITIVE Sensitive     AMPICILLIN/SULBACTAM 4 SENSITIVE Sensitive     PIP/TAZO <=4 SENSITIVE Sensitive     Extended ESBL NEGATIVE Sensitive     * >=100,000 COLONIES/mL ESCHERICHIA COLI  Blood Culture (routine x 2)     Status: None (Preliminary result)   Collection Time: 02/07/16 11:45 PM  Result Value Ref Range Status   Specimen Description BLOOD RIGHT FOREARM  Final   Special Requests BOTTLES DRAWN AEROBIC AND ANAEROBIC  Final   Culture NO GROWTH 4 DAYS  Final   Report Status PENDING  Incomplete  Blood Culture (routine x 2)     Status: None (Preliminary result)   Collection Time: 02/07/16 11:55 PM  Result Value Ref Range Status   Specimen Description BLOOD LEFT ARM  Final   Special Requests IN PEDIATRIC BOTTLE  Final   Culture NO GROWTH 4 DAYS  Final   Report Status PENDING  Incomplete     Labs: BNP (last 3 results)  Recent Labs  08/26/15 0630 01/24/16 1633 02/07/16 2118  BNP 39.0 431.5* 64.9   Basic Metabolic Panel:  Recent Labs Lab 02/07/16 2118 02/08/16 0340 02/09/16 0709 02/10/16 0448 02/11/16 0558  NA 132* 134* 134* 133* 133*  K 4.9 5.1 4.3 4.6 4.5  CL 97* 103 107 104 104  CO2 22 22 20* 21* 20*  GLUCOSE 135* 130* 212* 244* 210*  BUN 81* 63* 40* 37* 36*  CREATININE 1.97* 1.65* 1.30* 1.19* 1.17*  CALCIUM 9.8 8.9 8.6* 9.0 9.1   Liver Function Tests:  Recent Labs Lab 02/07/16 2118  AST 24  ALT 41  ALKPHOS 59  BILITOT 0.5  PROT 7.5  ALBUMIN 3.8   No results for input(s): LIPASE, AMYLASE in the last 168 hours. No results for input(s): AMMONIA in the last 168 hours. CBC:  Recent Labs Lab 02/07/16 2118 02/08/16 0340 02/09/16 0709 02/10/16 0448 02/11/16 0558  WBC 16.6* 16.0* 8.1 9.1 10.0  NEUTROABS 14.9*  --   --   --  8.3*  HGB 10.7* 10.0* 8.7* 8.9* 9.5*  HCT 31.0* 29.3* 25.3* 26.2* 27.7*  MCV 84.5 84.4  84.6 84.8 84.7  PLT 339 305 274 292 310   Cardiac Enzymes:  Recent Labs Lab 02/08/16 0340 02/08/16 0726 02/08/16 1300  TROPONINI 0.03* 0.04* 0.04*   BNP: Invalid input(s): POCBNP CBG:  Recent Labs Lab 02/11/16 0812 02/11/16 1224 02/11/16 1637 02/11/16 2159 02/12/16 0812  GLUCAP 213* 211* 286* 196* 220*   D-Dimer No results for input(s): DDIMER in the last 72 hours. Hgb A1c No results for input(s): HGBA1C in the last 72 hours. Lipid Profile No results for input(s): CHOL, HDL, LDLCALC, TRIG, CHOLHDL, LDLDIRECT in the last 72 hours. Thyroid function studies No results for input(s): TSH, T4TOTAL, T3FREE, THYROIDAB in the last 72 hours.  Invalid input(s): FREET3 Anemia work up No results for input(s): VITAMINB12, FOLATE, FERRITIN, TIBC, IRON, RETICCTPCT in the last 72 hours. Urinalysis    Component Value Date/Time   COLORURINE YELLOW 02/07/2016 2112   APPEARANCEUR CLOUDY (A) 02/07/2016 2112   LABSPEC 1.013 02/07/2016 2112   PHURINE 8.5 (H) 02/07/2016 2112   GLUCOSEU NEGATIVE 02/07/2016 2112   HGBUR NEGATIVE 02/07/2016 2112   BILIRUBINUR NEGATIVE 02/07/2016 2112   KETONESUR NEGATIVE 02/07/2016 2112   PROTEINUR 30 (A) 02/07/2016 2112   UROBILINOGEN 0.2 01/27/2015 2308   NITRITE POSITIVE (A) 02/07/2016 2112   LEUKOCYTESUR LARGE (A) 02/07/2016 2112   Sepsis Labs Invalid input(s):  PROCALCITONIN,  WBC,  LACTICIDVEN Microbiology Recent Results (from the past 240 hour(s))  Urine culture     Status: Abnormal   Collection Time: 02/07/16  9:12 PM  Result Value Ref Range Status   Specimen Description URINE, CLEAN CATCH  Final   Special Requests CX ADDED AT 0049 ON [id number]  Final   Culture >=100,000 COLONIES/mL ESCHERICHIA COLI (A)  Final   Report Status 02/10/2016 FINAL  Final   Organism ID, Bacteria ESCHERICHIA COLI (A)  Final      Susceptibility   Escherichia coli - MIC*    AMPICILLIN >=32 RESISTANT Resistant     CEFAZOLIN <=4 SENSITIVE Sensitive     CEFTRIAXONE  <=1 SENSITIVE Sensitive     CIPROFLOXACIN >=4 RESISTANT Resistant     GENTAMICIN <=1 SENSITIVE Sensitive     IMIPENEM <=0.25 SENSITIVE Sensitive     NITROFURANTOIN <=16 SENSITIVE Sensitive     TRIMETH/SULFA <=20 SENSITIVE Sensitive     AMPICILLIN/SULBACTAM 4 SENSITIVE Sensitive     PIP/TAZO <=4 SENSITIVE Sensitive     Extended ESBL NEGATIVE Sensitive     * >=100,000 COLONIES/mL ESCHERICHIA COLI  Blood Culture (routine x 2)     Status: None (Preliminary result)   Collection Time: 02/07/16 11:45 PM  Result Value Ref Range Status   Specimen Description BLOOD RIGHT FOREARM  Final   Special Requests BOTTLES DRAWN AEROBIC AND ANAEROBIC  Final   Culture NO GROWTH 4 DAYS  Final   Report Status PENDING  Incomplete  Blood Culture (routine x 2)     Status: None (Preliminary result)   Collection Time: 02/07/16 11:55 PM  Result Value Ref Range Status   Specimen Description BLOOD LEFT ARM  Final   Special Requests IN PEDIATRIC BOTTLE  Final   Culture NO GROWTH 4 DAYS  Final   Report Status PENDING  Incomplete     Time coordinating discharge: 45 minutes  SIGNED:   Coralie Keens, MD  [facility] 02/12/2016, 11:11 AM Pager   If 7PM-7AM, please contact night-coverage [email or web address] Password [email or web address]

## 2016-02-12 NOTE — Progress Notes (Addendum)
ANTICOAGULATION CONSULT NOTE - Follow Up Consult  Pharmacy Consult for Warfarin Indication: atrial fibrillation and recent DVT  Patient Measurements: Height: 5\' 6"  (167.6 cm) (02/02/16 encounter) Weight: 131 lb (59.4 kg) (from 02/02/16 encounter) IBW/kg (Calculated) : 59.3  Vital Signs: Temp: 98 F (36.7 C) (11/11 0615) Temp Source: Oral (11/11 0615) BP: 141/56 (11/11 0615) Pulse Rate: 114 (11/11 0615)  Labs:  Recent Labs  02/10/16 0448 02/11/16 0558 02/12/16 0440  HGB 8.9* 9.5*  --   HCT 26.2* 27.7*  --   PLT 292 310  --   LABPROT 43.9* 39.6* 35.1*  INR 4.50* 3.95 3.39  CREATININE 1.19* 1.17*  --     Estimated Creatinine Clearance: 29.9 mL/min (by C-G formula based on SCr of 1.17 mg/dL (H)).   Assessment: 82yo female with AFib and recent DVT on Eliquis, now on warfarin.  INR trending down but still supratherapeutic at 3.39.  Will give another lower dose tonight in anticipation that INR will continue to trend down.  Hgb stable, pltc wnl.  No bleeding noted. Pt NPO 11/7, full liquid 11/8, now normal diet via GJ-tube. Pt on amiodarone prior to admission.   PTA dose = warfarin 5 mg daily  Goal of Therapy:  INR 2-3 Monitor platelets by anticoagulation protocol: Yes   Plan:  Warfarin 3 mg po x 1 Monitor daily INR, CBC, s/sx of bleed, PO intake, DDI   Thank you for allowing [place] to participate in this patients care.  Gwenlyn Perking, PharmD PGY1 Pharmacy Resident Pager: [phone] 02/12/2016 9:23 AM

## 2016-02-12 NOTE — Progress Notes (Signed)
CM notified [facility] for [facility] as previous CM has already arranged for HHPT/RN services with Al[facility].  No other CM needs were communicated.

## 2016-02-12 NOTE — Progress Notes (Signed)
Nsg Discharge Note  Admit Date:  02/07/2016 Discharge date: 02/12/2016   Shannon [name] to be D/C'd Home per MD order.  AVS completed.  Copy for chart, and copy for patient signed, and dated. Patient/caregiver able to verbalize understanding.  Discharge Medication:   Medication List    TAKE these medications   acetaminophen 325 MG tablet Commonly known as:  TYLENOL Take 650 mg by mouth 2 (two) times daily as needed for mild pain.   ADVAIR DISKUS 250-50 MCG/DOSE Aepb Generic drug:  Fluticasone-Salmeterol INHALE 1 PUFF INTO THE LUNGS 2 (TWO) TIMES DAILY AS NEEDED FOR SHORTNESS OF BREATH   AMINO ACIDS-PROTEIN HYDROLYS PO Take 30 mLs by mouth 2 (two) times daily.   amiodarone 200 MG tablet Commonly known as:  PACERONE Take 1 tablet (200 mg total) by mouth 2 (two) times daily. What changed:  Another medication with the same name was removed. Continue taking this medication, and follow the directions you see here.   amLODipine 10 MG tablet Commonly known as:  NORVASC TAKE 1 TABLET BY MOUTH DAILY FOR HYPERTENSION What changed:  See the new instructions.   atorvastatin 10 MG tablet Commonly known as:  LIPITOR Take 10 mg by mouth daily.   b complex vitamins tablet Take 1 tablet by mouth daily.   bisacodyl 10 MG suppository Commonly known as:  DULCOLAX Place 1 suppository (10 mg total) rectally daily as needed for moderate constipation.   calcium-vitamin D 500-200 MG-UNIT tablet Commonly known as:  OSCAL WITH D Take 1 tablet by mouth daily with breakfast.   cephALEXin 500 MG capsule Commonly known as:  KEFLEX Take 1 capsule (500 mg total) by mouth 2 (two) times daily.   cetirizine 10 MG tablet Commonly known as:  ZYRTEC Take 10 mg by mouth daily as needed for allergies or rhinitis.   chlorhexidine 0.12 % solution Commonly known as:  PERIDEX Use as directed 15 mLs in the mouth or throat 2 (two) times daily.   diclofenac sodium 1 % Gel Commonly known as:  VOLTAREN Apply  2 g topically daily as needed (for pain).   exenatide 5 MCG/0.02ML Sopn injection Commonly known as:  BYETTA 5 MCG PEN Inject 0.02 mLs (5 mcg total) into the skin 2 (two) times daily with a meal.   feeding supplement (OSMOLITE 1.5 CAL) Liqd Place 1,000 mLs into feeding tube daily. What changed:  additional instructions   feeding supplement Liqd Take 1 Container by mouth daily. What changed:  when to take this   fluticasone 50 MCG/ACT nasal spray Commonly known as:  FLONASE Place 2 sprays into both nostrils daily.   free water Soln Place 60 mLs into feeding tube every 6 (six) hours.   gabapentin 300 MG capsule Commonly known as:  NEURONTIN Take 1 capsule (300 mg total) by mouth 3 (three) times daily.   hydrALAZINE 50 MG tablet Commonly known as:  APRESOLINE Take 1 tablet (50 mg total) by mouth 3 (three) times daily.   ipratropium-albuterol 0.5-2.5 (3) MG/3ML Soln Commonly known as:  DUONEB Take 3 mLs by nebulization every 6 (six) hours as needed (sob). Use 3 times daily x 4 days then every 6 hours as needed. What changed:  reasons to take this  additional instructions   isosorbide mononitrate 30 MG 24 hr tablet Commonly known as:  IMDUR TAKE 1 TABLET (30 MG TOTAL) BY MOUTH DAILY.   latanoprost 0.005 % ophthalmic solution Commonly known as:  XALATAN Place 1 drop into both eyes at bedtime.  levalbuterol 45 MCG/ACT inhaler Commonly known as:  XOPENEX HFA Inhale 2 puffs into the lungs every 6 (six) hours as needed for wheezing or shortness of breath.   levothyroxine 137 MCG tablet Commonly known as:  SYNTHROID, LEVOTHROID Take 1 tablet (137 mcg total) by mouth daily before breakfast.   multivitamin with minerals Tabs tablet Take 1 tablet by mouth daily.   NONFORMULARY OR COMPOUNDED ITEM Apply 1-2 g topically 4 (four) times daily. Peripheral Neuropathy cream: Bupivacaine 1% Doxepin 3% Gabapentin 6% Ibuprofen 3% Pentoxifylline 3%   ondansetron 4 MG  tablet Commonly known as:  ZOFRAN Take 1 tablet (4 mg total) by mouth every 8 (eight) hours as needed for nausea or vomiting.   pantoprazole 40 MG tablet Commonly known as:  PROTONIX TAKE 1 TABLET BY MOUTH TWICE A DAY What changed:  See the new instructions.   Polyethyl Glycol-Propyl Glycol 0.4-0.3 % Soln Place 1 drop into both eyes 4 (four) times daily.   polyethylene glycol powder powder Commonly known as:  GLYCOLAX/MIRALAX MIX 17GRAMS IN 8 OUNCES OF LIQUID AND DRINK TWICE DAILY AS NEEDED What changed:  See the new instructions.   predniSONE 10 MG tablet Commonly known as:  DELTASONE Take 1 tablet (10 mg total) by mouth daily with breakfast. What changed:  Another medication with the same name was removed. Continue taking this medication, and follow the directions you see here.   RESOURCE THICKENUP CLEAR Powd Take 120 g by mouth as needed (use to thicken liquids.). What changed:  how much to take  how to take this  when to take this  additional instructions   RESTASIS 0.05 % ophthalmic emulsion Generic drug:  cycloSPORINE USE 1 DROP INTO BOTH EYES TWICE DAILY   sertraline 100 MG tablet Commonly known as:  ZOLOFT Take one tablet by mouth once daily for depression What changed:  how much to take  how to take this  when to take this  additional instructions   simethicone 80 MG chewable tablet Commonly known as:  MYLICON Chew 80 mg by mouth 4 (four) times daily as needed for flatulence.   traMADol 50 MG tablet Commonly known as:  ULTRAM Take 50 mg by mouth daily as needed for moderate pain.   warfarin 5 MG tablet Commonly known as:  COUMADIN Take 0.5 tablets (2.5 mg total) by mouth one time only at 6 PM. Please resume taking after INR/ protime check. What changed:  how much to take  additional instructions       Discharge Assessment: Vitals:   02/12/16 0615 02/12/16 1357  BP: (!) 141/56 (!) 143/53  Pulse: (!) 114 87  Resp: (!) 22 18  Temp: 98  F (36.7 C) 98.1 F (36.7 C)   Skin clean, dry and intact without evidence of skin break down, no evidence of skin tears noted. IV catheter discontinued intact. Site without signs and symptoms of complications - no redness or edema noted at insertion site, patient denies c/o pain - only slight tenderness at site.  Dressing with slight pressure applied.  D/c Instructions-Education: Discharge instructions given to patient/family with verbalized understanding. D/c education completed with patient/family including follow up instructions, medication list, d/c activities limitations if indicated, with other d/c instructions as indicated by MD - patient able to verbalize understanding, all questions fully answered. Patient instructed to return to ED, call 911, or call MD for any changes in condition.  Patient escorted via [place], and D/C home via private auto.  Salley Slaughter, RN 02/12/2016  2:29 PM

## 2016-02-13 ENCOUNTER — Encounter: Payer: Self-pay | Admitting: Physician Assistant

## 2016-02-13 LAB — CULTURE, BLOOD (ROUTINE X 2)
Culture: NO GROWTH
Culture: NO GROWTH

## 2016-02-13 NOTE — Progress Notes (Signed)
Cardiology Office Note    Date:  02/14/2016  ID:  Shannon [name], DOB [birth year], MRN [id number] PCP:  Shannon Boys, DO  Cardiologist:  Shannon [name]   Chief Complaint: f/u recent hospital stays, atrial fib/flutter  History of Present Illness:  Shannon [name] is a 82 y.o. female with history of persistent atrial fibrillation on amiodarone, bradycardia, CKD stage III, hypothyroidism,non-obstructive coronary artery disease, type 2 diabetes, hypertension, chronic diastolic CHF, stroke, cerebrovascular disease, COPD, asthma, gastric outlet obstruction s/p prior J tube, anemia who presents for post-hospital follow-up. She has been admitted multiple times in 2017. She was admitted in August with bradycardia and weakness with HRs in the 30s-40s. Her labetololwas discontinued and amiodarone was reduced from 200 mg daily to 100 mg daily. She was seen in the office 01/24/16 extremely SOB with rapid atrial flutter and hypoxia. She was admitted to [facility] and upon arrival she required Bipap and IV Lasix with rapid improvement with brisk UOP. She converted to NSR on amiodarone drip. She was also noted to have a LLE DVT. Her Eliquis was held and she was started on IV heparin. Hematology saw her and recommended Xarelto but with her GFR < 30, Coumadin was started. Her amiodarone was increased to 200mg  BID x 2 weeks with plans to decrease to 200mg  daily as OP if remaining in NSR. IR also saw her for J tube obstruction. 2D Echo 01/27/16 showed EF 55-60%, grade 1 DD, mild AI/MR, mod LAE. Unfortunately she was re-admitted 11/6-11/11 with worsening SOB and chest pain with fever, leukocytosis, tachycardia, AKI on CKD, and was treated for sepsis due to UTI. Her Coumadin was held due to supratherapeutic range. EKG on 02/07/16 showed atrial flutter. Last labs during that adm showed Hgb 9.5, Na 133, BUN 36, Cr 1.17, INR 3.27.  She returns for follow-up with her daughter. It is quite challenging to understand where Shannon [name]'s baseline is. She appears very frail and gets SOB with activity or long-winded conversation. She says she hasn't felt good in years. She states she's more SOB than she was in the hospital recently, but overall better than where she has been in the last few months. She denies any LEE. Weight is up 7lb by our scales but she says it's been stable at home. No syncope. She has a lot of neck and back pain. She feels like if her teeth were extracted that this pain would improve. Pulse was 111 after walking to the chair with walker, currently 90 at rest (continued atrial flutter ever since recent readmission).  Her daughter is particularly concerned about her sodium level, citing that her PCP used to have her on sodium tablets for this and she is no longer on these.  Past Medical History:  Diagnosis Date  . Anemia    previous blood transfusions  . Arthritis    "all over"  . Asthma   . Bradycardia    requiring previous d/c of BB and reduction of amiodarone  . CAD (coronary artery disease)    nonobstructive per notes  . Chronic diastolic CHF (congestive heart failure) (HCC)   . CKD (chronic kidney disease), stage III   . Complication of blood transfusion    "got the wrong blood type at [facility] in ~ 2015; no adverse reaction that we are aware of"/daughter, Shannon [name] (01/27/2016)  . COPD (chronic obstructive pulmonary disease) (HCC)   . Depression    "light case"  . DVT (deep venous thrombosis) (HCC) 01/2016   a. LLE  DVT 01/2016 - switched from Eliquis to Coumadin.  . Gastric stenosis    a. s/p stomach tube  . GERD (gastroesophageal reflux disease)   . History of blood transfusion    "several" (01/27/2016)  . History of stomach ulcers   . Hyperlipidemia   . Hypertension   . Hypothyroidism   . Paraesophageal hernia   . Perforated gastric ulcer (HCC)   . Seasonal allergies   . SIADH (syndrome of inappropriate ADH production) (HCC)    Shannon [name] 01/10/2015  . Small bowel obstruction    "I  don't know how many" (01/11/2015)  . Stroke (HCC)    "light one"  . Type II diabetes mellitus (HCC)    "related to prednisone use  for > 20 yr; once predinose stopped; no more DM RX" (01/27/2016)  . Ventral hernia with bowel obstruction     Past Surgical History:  Procedure Laterality Date  . CATARACT EXTRACTION W/ INTRAOCULAR LENS  IMPLANT, BILATERAL    . CHOLECYSTECTOMY OPEN    . COLECTOMY    . ESOPHAGOGASTRODUODENOSCOPY N/A 01/19/2014   Procedure: ESOPHAGOGASTRODUODENOSCOPY (EGD);  Surgeon: Hilarie Fredrickson, MD;  Location: [facility];  Service: Endoscopy;  Laterality: N/A;  . ESOPHAGOGASTRODUODENOSCOPY N/A 01/20/2014   Procedure: ESOPHAGOGASTRODUODENOSCOPY (EGD);  Surgeon: Hilarie Fredrickson, MD;  Location: [facility] ENDOSCOPY;  Service: Endoscopy;  Laterality: N/A;  . ESOPHAGOGASTRODUODENOSCOPY N/A 03/19/2014   Procedure: ESOPHAGOGASTRODUODENOSCOPY (EGD);  Surgeon: Rachael Fee, MD;  Location: [facility];  Service: Endoscopy;  Laterality: N/A;  . ESOPHAGOGASTRODUODENOSCOPY N/A 07/08/2015   Procedure: ESOPHAGOGASTRODUODENOSCOPY (EGD);  Surgeon: Sherrilyn Rist, MD;  Location: [facility] ENDOSCOPY;  Service: Endoscopy;  Laterality: N/A;  . ESOPHAGOGASTRODUODENOSCOPY (EGD) WITH PROPOFOL N/A 09/15/2015   Procedure: ESOPHAGOGASTRODUODENOSCOPY (EGD) WITH PROPOFOL;  Surgeon: Ruffin Frederick, MD;  Location: [facility] ENDOSCOPY;  Service: Gastroenterology;  Laterality: N/A;  . GASTROJEJUNOSTOMY     hx/notes 01/10/2015  . GASTROJEJUNOSTOMY N/A 09/23/2015   Procedure: OPEN GASTROJEJUNOSTOMY TUBE PLACEMENT;  Surgeon: De Blanch Kinsinger, MD;  Location: [facility] ORS;  Service: General;  Laterality: N/A;  . GLAUCOMA SURGERY Bilateral   . HERNIA REPAIR  2015  . IR GENERIC HISTORICAL  01/07/2016   IR GJ TUBE CHANGE 01/07/2016 Shannon Moan, MD [facility]INTER[facility] RAD  . IR GENERIC HISTORICAL  01/27/2016   IR MECH REMOV OBSTRUC MAT ANY COLON TUBE W/FLUORO 01/27/2016 Shannon Overlie, MD [facility]  . IR GENERIC HISTORICAL  02/07/2016     IR PATIENT EVAL TECH 0-60 MINS Shannon K Allred, PA-C [facility]  . IR GENERIC HISTORICAL  02/08/2016   IR GJ TUBE CHANGE 02/08/2016 Berdine Dance, MD [facility]  . LAPAROTOMY N/A 01/20/2015   Procedure: EXPLORATORY LAPAROTOMY;  Surgeon: Abigail Miyamoto, MD;  Location: [facility] OR;  Service: General;  Laterality: N/A;  . LYSIS OF ADHESION N/A 01/20/2015   Procedure: LYSIS OF ADHESIONS < 1 HOUR;  Surgeon: Abigail Miyamoto, MD;  Location: [facility] OR;  Service: General;  Laterality: N/A;  . TONSILLECTOMY    . TUBAL LIGATION    . VENTRAL HERNIA REPAIR  2015   incarcerated ventral hernia ([facility] 09/2013)/notes 01/10/2015    Current Medications: Current Outpatient Prescriptions  Medication Sig Dispense Refill  . acetaminophen (TYLENOL) 325 MG tablet Take 650 mg by mouth 2 (two) times daily as needed for mild pain.     [name] ADVAIR DISKUS 250-50 MCG/DOSE AEPB INHALE 1 PUFF INTO THE LUNGS 2 (TWO) TIMES DAILY AS NEEDED FOR SHORTNESS OF BREATH  3  . AMINO ACIDS-PROTEIN HYDROLYS PO Take  30 mLs by mouth 2 (two) times daily.    [name] amiodarone (PACERONE) 200 MG tablet Take 1 tablet (200 mg total) by mouth 2 (two) times daily. 28 tablet 0  . amLODipine (NORVASC) 10 MG tablet TAKE 1 TABLET BY MOUTH DAILY FOR HYPERTENSION (Patient taking differently: Take 10 mg by mouth once a day for hypertension) 30 tablet 3  . atorvastatin (LIPITOR) 10 MG tablet Take 10 mg by mouth daily.    [name] b complex vitamins tablet Take 1 tablet by mouth daily.     . bisacodyl (DULCOLAX) 10 MG suppository Place 1 suppository (10 mg total) rectally daily as needed for moderate constipation. 12 suppository 0  . calcium-vitamin D (OSCAL WITH D) 500-200 MG-UNIT per tablet Take 1 tablet by mouth daily with breakfast.     . cephALEXin (KEFLEX) 500 MG capsule Take 1 capsule (500 mg total) by mouth 2 (two) times daily. 8 capsule 0  . cetirizine (ZYRTEC) 10 MG tablet Take 10 mg by mouth daily as needed for allergies or rhinitis.    . chlorhexidine  (PERIDEX) 0.12 % solution Use as directed 15 mLs in the mouth or throat 2 (two) times daily.    . diclofenac sodium (VOLTAREN) 1 % GEL Apply 2 g topically daily as needed (for pain).     . feeding supplement (BOOST / RESOURCE BREEZE) LIQD Take 1 Container by mouth daily. (Patient taking differently: Take 1 Container by mouth 2 (two) times daily between meals. )    . fluticasone (FLONASE) 50 MCG/ACT nasal spray Place 2 sprays into both nostrils daily. 16 g 3  . gabapentin (NEURONTIN) 300 MG capsule Take 1 capsule (300 mg total) by mouth 3 (three) times daily. 90 capsule 12  . hydrALAZINE (APRESOLINE) 50 MG tablet Take 1 tablet (50 mg total) by mouth 3 (three) times daily. 120 tablet 0  . ipratropium-albuterol (DUONEB) 0.5-2.5 (3) MG/3ML SOLN Take 3 mLs by nebulization every 6 (six) hours as needed (sob). Use 3 times daily x 4 days then every 6 hours as needed. (Patient taking differently: Take 3 mLs by nebulization every 6 (six) hours as needed (for shortness of breath). ) 360 mL 4  . isosorbide mononitrate (IMDUR) 30 MG 24 hr tablet TAKE 1 TABLET (30 MG TOTAL) BY MOUTH DAILY.  2  . latanoprost (XALATAN) 0.005 % ophthalmic solution Place 1 drop into both eyes at bedtime. 2.5 mL 12  . levalbuterol (XOPENEX HFA) 45 MCG/ACT inhaler Inhale 2 puffs into the lungs every 6 (six) hours as needed for wheezing or shortness of breath. 1 Inhaler 12  . levothyroxine (SYNTHROID, LEVOTHROID) 137 MCG tablet Take 1 tablet (137 mcg total) by mouth daily before breakfast. 30 tablet 2  . Maltodextrin-Xanthan Gum (RESOURCE THICKENUP CLEAR) POWD Take 120 g by mouth as needed (use to thicken liquids.). (Patient taking differently: [name] admin instructions. USED TO THICKEN LIQUIDS) 20 Can 0  . Multiple Vitamin (MULTIVITAMIN WITH MINERALS) TABS tablet Take 1 tablet by mouth daily.    . Nutritional Supplements (FEEDING SUPPLEMENT, OSMOLITE 1.5 CAL,) LIQD Place 1,000 mLs into feeding tube daily. (Patient taking differently: Place  1,000 mLs into feeding tube daily. (at night for 12 hours)) 1000 mL 3  . ondansetron (ZOFRAN) 4 MG tablet Take 1 tablet (4 mg total) by mouth every 8 (eight) hours as needed for nausea or vomiting. 20 tablet 0  . pantoprazole (PROTONIX) 40 MG tablet TAKE 1 TABLET BY MOUTH TWICE A DAY (Patient taking differently: Take 40 mg by  mouth two times a day) 60 tablet 5  . Polyethyl Glycol-Propyl Glycol 0.4-0.3 % SOLN Place 1 drop into both eyes 4 (four) times daily.     . polyethylene glycol powder (GLYCOLAX/MIRALAX) powder MIX 17GRAMS IN 8 OUNCES OF LIQUID AND DRINK TWICE DAILY AS NEEDED (Patient taking differently: Mix 17 grams into 8 ounces of juice and drink one to two times a day as needed) 3162 g 1  . RESTASIS 0.05 % ophthalmic emulsion USE 1 DROP INTO BOTH EYES TWICE DAILY 60 mL 0  . sertraline (ZOLOFT) 100 MG tablet Take one tablet by mouth once daily for depression (Patient taking differently: Take 100 mg by mouth daily. FOR DEPRESSION) 90 tablet 1  . simethicone (MYLICON) 80 MG chewable tablet Chew 80 mg by mouth 4 (four) times daily as needed for flatulence.    . traMADol (ULTRAM) 50 MG tablet Take 50 mg by mouth daily as needed for moderate pain.     [name] warfarin (COUMADIN) 5 MG tablet Take 0.5 tablets (2.5 mg total) by mouth one time only at 6 PM. Please resume taking after INR/ protime check. 30 tablet 12  . Water For Irrigation, Sterile (FREE WATER) SOLN Place 60 mLs into feeding tube every 6 (six) hours.     No current facility-administered medications for this visit.      Allergies:   Penicillins   Social History   Social History  . Marital status: Widowed    Spouse name: N/A  . Number of children: 3  . Years of education: N/A   Social History Main Topics  . Smoking status: Never Smoker  . Smokeless tobacco: Former Neurosurgeon    Types: Snuff     Comment: "used snuff in my younger days"  . Alcohol use No  . Drug use: No  . Sexual activity: No   Other Topics Concern  . None    Social History Narrative   Married in [place], lives in one story house with 2 people and no pets   Occupation: ?   Has a living Will, [place], and doesn't have DNR   Was living with Husband (in frail health) in [place] until 09/2013.  After her [facility] admission they both came to live with daughter in [place]     Family History:  The patient's family history includes Diabetes in her brother; Hypertension in her mother; Stroke in her mother.   ROS:   Please [name] the history of present illness. All other systems are reviewed and otherwise negative.    PHYSICAL EXAM:   VS:  BP 124/84   Pulse (!) 111   Ht 5\' 6"  (1.676 m)   Wt 138 lb (62.6 kg)   SpO2 98%   BMI 22.27 kg/m   BMI: Body mass index is 22.27 kg/m. GEN: Thin elderly AAF, in no acute distress  HEENT: normocephalic, atraumatic Neck: no JVD, carotid bruits, or masses Cardiac: Irregularly irregular; no murmurs, rubs, or gallops, no edema with hose on Respiratory: coarse but clear to auscultation bilaterally without rales/wheezes/rhonchi, normal work of breathing when at rest but does appear somewhat SOB with longwinded conversation. GI: soft, nontender, nondistended, + BS MS: no deformity or atrophy  Skin: warm and dry, no rash Neuro:  Alert and Oriented x 3, Strength and sensation are intact, follows commands Psych: euthymic mood, full affect  Wt Readings from Last 3 Encounters:  02/14/16 138 lb (62.6 kg)  02/08/16 131 lb (59.4 kg)  02/02/16 131 lb 14.4 oz (59.8 kg)  Studies/Labs Reviewed:   EKG:  EKG was ordered today and personally reviewed by me and demonstrates atrial flutter 91bpm, nonspecific ST-T changes.  Recent Labs: 11/11/2015: Magnesium 1.8 01/25/2016: TSH 8.794 02/07/2016: ALT 41; B Natriuretic Peptide 64.9 02/11/2016: BUN 36; Creatinine, Ser 1.17; Hemoglobin 9.5; Platelets 310; Potassium 4.5; Sodium 133   Lipid Panel    Component Value Date/Time   CHOL 126 04/07/2015 1015   TRIG 107 04/07/2015  1015   HDL 69 04/07/2015 1015   CHOLHDL 1.8 04/07/2015 1015   CHOLHDL 1.9 12/24/2013 0456   VLDL 26 12/24/2013 0456   LDLCALC 36 04/07/2015 1015    Additional studies/ records that were reviewed today include: Summarized above.    ASSESSMENT & PLAN:   1. Shortness of breath - very complicated patient. Suspect dyspnea is multifactorial due to her recurrent atrial flutter, asthma, and anemia. There may also be a component of diastolic CHF from her atrial flutter although her lungs currently sound OK. Will check labs today including thyroid function, lytes, BNP. The original plan was to decrease amiodarone if she was maintaining NSR but she is back in atrial flutter as of recent readmission. I think she would benefit from seeing EP to review further options for tachy/brady syndrome. Cannot be too aggressive with her AVN blocking agents as she had episodes of bradycardia earlier this year in the 30s-40s. She is not an ideal pacemaker candidate given her advanced age and comorbidities but I would like EP input - will continue current regimen and refer.  2. Paroxysmal atrial flutter/persistent atrial fib with history of bradycardia - has not had INR checked since discharge. Home health was supposed to come out today but they had this appointment instead. Will add to labs above, and plan to forward to our Coumadin clinic. Her daughter inquired about whether she could come off Coumadin for tooth extraction. Given her recent DVT I would advise they hold off for now. 3. Bradycardia - as above.  4. Chronic diastolic CHF - f/u BNP and BMET. Advised to avoid excess sodium. 5. CKD stage III - recheck today.  Disposition: F/u with EP ASAP. Already has f/u with Dr. Delton [name] in 03/2016 as well. She has follow-up with her PCP later this week.    Medication Adjustments/Labs and Tests Ordered: Current medicines are reviewed at length with the patient today.  Concerns regarding medicines are outlined above.  Medication changes, Labs and Tests ordered today are summarized above and listed in the Patient Instructions accessible in Encounters.   Thomasene Mohair PA-C  02/14/2016 2:07 PM    [facility] [address] [place], [place], [place]  [address] Phone: [phone]; Fax: [phone]

## 2016-02-14 ENCOUNTER — Encounter: Payer: Self-pay | Admitting: Physician Assistant

## 2016-02-14 ENCOUNTER — Ambulatory Visit (INDEPENDENT_AMBULATORY_CARE_PROVIDER_SITE_OTHER): Payer: Medicare Other | Admitting: Physician Assistant

## 2016-02-14 ENCOUNTER — Telehealth: Payer: Self-pay

## 2016-02-14 VITALS — BP 124/84 | HR 111 | Ht 66.0 in | Wt 138.0 lb

## 2016-02-14 DIAGNOSIS — R001 Bradycardia, unspecified: Secondary | ICD-10-CM | POA: Diagnosis not present

## 2016-02-14 DIAGNOSIS — I4819 Other persistent atrial fibrillation: Secondary | ICD-10-CM

## 2016-02-14 DIAGNOSIS — I5032 Chronic diastolic (congestive) heart failure: Secondary | ICD-10-CM

## 2016-02-14 DIAGNOSIS — I4892 Unspecified atrial flutter: Secondary | ICD-10-CM

## 2016-02-14 DIAGNOSIS — Z79899 Other long term (current) drug therapy: Secondary | ICD-10-CM | POA: Diagnosis not present

## 2016-02-14 DIAGNOSIS — I6523 Occlusion and stenosis of bilateral carotid arteries: Secondary | ICD-10-CM

## 2016-02-14 DIAGNOSIS — I481 Persistent atrial fibrillation: Secondary | ICD-10-CM | POA: Diagnosis not present

## 2016-02-14 DIAGNOSIS — R0602 Shortness of breath: Secondary | ICD-10-CM

## 2016-02-14 DIAGNOSIS — N183 Chronic kidney disease, stage 3 unspecified: Secondary | ICD-10-CM

## 2016-02-14 NOTE — Patient Instructions (Signed)
Medication Instructions:  The current medical regimen is effective;  continue present plan and medications.  Labwork: Please have blood work today (BMP,CBC, TSH, BNP.PT/INR and Free T4)  You have been referred to EP (as soon as possible) for further evaluation of tachy/brady syndrome.  Follow-Up: Follow up as scheduled with Dr Meda Coffee.  If you need a refill on your cardiac medications before your next appointment, please call your pharmacy.  Thank you for choosing Shannon [name]!!

## 2016-02-14 NOTE — Telephone Encounter (Addendum)
I have made the 1st attempt to contact the patient or family member in charge, in order to follow up from recently being discharged from the hospital. I left a message on voicemail but I will make another attempt at a different time.  Pt's daughter was called on 02/16/16, to confirm TOC F/U appt. For 02/17/16.

## 2016-02-15 ENCOUNTER — Telehealth: Payer: Self-pay | Admitting: Cardiology

## 2016-02-15 DIAGNOSIS — I10 Essential (primary) hypertension: Secondary | ICD-10-CM

## 2016-02-15 DIAGNOSIS — J9601 Acute respiratory failure with hypoxia: Secondary | ICD-10-CM | POA: Diagnosis not present

## 2016-02-15 DIAGNOSIS — I480 Paroxysmal atrial fibrillation: Secondary | ICD-10-CM | POA: Diagnosis not present

## 2016-02-15 LAB — BASIC METABOLIC PANEL
BUN: 55 mg/dL — ABNORMAL HIGH (ref 7–25)
CO2: 23 mmol/L (ref 20–31)
Calcium: 9.1 mg/dL (ref 8.6–10.4)
Chloride: 97 mmol/L — ABNORMAL LOW (ref 98–110)
Creat: 1.64 mg/dL — ABNORMAL HIGH (ref 0.60–0.88)
Glucose, Bld: 172 mg/dL — ABNORMAL HIGH (ref 65–99)
Potassium: 5.4 mmol/L — ABNORMAL HIGH (ref 3.5–5.3)
Sodium: 130 mmol/L — ABNORMAL LOW (ref 135–146)

## 2016-02-15 LAB — CBC
HCT: 28.7 % — ABNORMAL LOW (ref 35.0–45.0)
Hemoglobin: 9.2 g/dL — ABNORMAL LOW (ref 11.7–15.5)
MCH: 28.9 pg (ref 27.0–33.0)
MCHC: 32.1 g/dL (ref 32.0–36.0)
MCV: 90.3 fL (ref 80.0–100.0)
MPV: 9.5 fL (ref 7.5–12.5)
Platelets: 360 10*3/uL (ref 140–400)
RBC: 3.18 MIL/uL — ABNORMAL LOW (ref 3.80–5.10)
RDW: 16.9 % — ABNORMAL HIGH (ref 11.0–15.0)
WBC: 9.6 10*3/uL (ref 3.8–10.8)

## 2016-02-15 LAB — T4, FREE: Free T4: 1.5 ng/dL (ref 0.8–1.8)

## 2016-02-15 LAB — TSH: TSH: 7.34 mIU/L — ABNORMAL HIGH

## 2016-02-15 LAB — POCT INR: INR: 4.7

## 2016-02-15 LAB — BRAIN NATRIURETIC PEPTIDE: Brain Natriuretic Peptide: 40.9 pg/mL (ref <100)

## 2016-02-15 LAB — PROTIME-INR
INR: 5.2 — ABNORMAL HIGH
Prothrombin Time: 50.1 s — ABNORMAL HIGH (ref 9.0–11.5)

## 2016-02-15 NOTE — Telephone Encounter (Signed)
New message  Pt is returning Shannon [name]'s call

## 2016-02-16 ENCOUNTER — Telehealth: Payer: Self-pay | Admitting: *Deleted

## 2016-02-16 ENCOUNTER — Ambulatory Visit (INDEPENDENT_AMBULATORY_CARE_PROVIDER_SITE_OTHER): Payer: Medicare Other | Admitting: Cardiovascular Disease

## 2016-02-16 DIAGNOSIS — I480 Paroxysmal atrial fibrillation: Secondary | ICD-10-CM | POA: Diagnosis not present

## 2016-02-16 DIAGNOSIS — Z5181 Encounter for therapeutic drug level monitoring: Secondary | ICD-10-CM

## 2016-02-16 DIAGNOSIS — J9601 Acute respiratory failure with hypoxia: Secondary | ICD-10-CM | POA: Diagnosis not present

## 2016-02-16 DIAGNOSIS — R0602 Shortness of breath: Secondary | ICD-10-CM

## 2016-02-16 MED ORDER — WARFARIN SODIUM 2.5 MG PO TABS: ORAL_TABLET | ORAL | 1 refills | Status: DC

## 2016-02-16 NOTE — Telephone Encounter (Signed)
-----   Message from Leeroy Bock, [facility] sent at 02/16/2016 10:41 AM EST ----- We have never seen this patient in clinic before. It looks like she was a new start Coumadin in the hospital - is there any reason she was not set up for a Coumadin visit in clinic to establish care with [place]? We like to see new Coumadin patients in clinic, especially for treatment of active DVT, because phone management can be very difficult, especially in a 82 year old. Home health also checked an INR yesterday and were confused on patient follow up. Will attempt to educate and dose patient on the phone but for the future, please have new Coumadin patients scheduled in clinic rather than checking INR with a lab draw for ease of follow up and optimal patient education, thanks.

## 2016-02-16 NOTE — Telephone Encounter (Signed)
Hinton Dyer with [facility] called and stated that she is seeing patient and had some concerns she wanted to let you know about before her appointment tomorrow with Janett Billow for a [facility] Follow up. She stated that patient was in the hospital and discharge from [facility] on the 11th with a new diagnosis of AFIB. She is seeing [facility]-Dr. Meda Coffee and also following up with the Coumadin Clinic.  She stated that patient could greatly benefit from seeing a Pulmonologist due to SOB and also she needs orders for PT and OT.

## 2016-02-16 NOTE — Telephone Encounter (Signed)
Follow up     Patient daughter calling back stating the nurse called today.

## 2016-02-16 NOTE — Telephone Encounter (Signed)
Pt daughter, [name], [place] on file, has been made aware of recommendations re: pt lab results. She will increase pt free water intake by 500cc... Jinny Blossom Supple has already contacted her re: coumadin instructions. Pt has pcp appt tomorrow 02-17-16, and I will put in order for repeat bmet and call them to make sure they will draw for [place]. Pt will keep her f/u appt 02-23-16

## 2016-02-16 NOTE — Telephone Encounter (Signed)
Lmptcb jw 02-16-16

## 2016-02-17 ENCOUNTER — Encounter: Payer: Self-pay | Admitting: Nurse Practitioner

## 2016-02-17 ENCOUNTER — Ambulatory Visit (INDEPENDENT_AMBULATORY_CARE_PROVIDER_SITE_OTHER): Payer: Medicare Other | Admitting: Nurse Practitioner

## 2016-02-17 ENCOUNTER — Other Ambulatory Visit: Payer: Self-pay

## 2016-02-17 VITALS — BP 122/64 | HR 70 | Temp 98.2°F | Resp 17 | Ht 66.0 in | Wt 141.8 lb

## 2016-02-17 DIAGNOSIS — I10 Essential (primary) hypertension: Secondary | ICD-10-CM

## 2016-02-17 DIAGNOSIS — E1165 Type 2 diabetes mellitus with hyperglycemia: Secondary | ICD-10-CM | POA: Diagnosis not present

## 2016-02-17 DIAGNOSIS — IMO0002 Reserved for concepts with insufficient information to code with codable children: Secondary | ICD-10-CM

## 2016-02-17 DIAGNOSIS — I482 Chronic atrial fibrillation, unspecified: Secondary | ICD-10-CM

## 2016-02-17 DIAGNOSIS — N390 Urinary tract infection, site not specified: Secondary | ICD-10-CM

## 2016-02-17 DIAGNOSIS — J441 Chronic obstructive pulmonary disease with (acute) exacerbation: Secondary | ICD-10-CM | POA: Diagnosis not present

## 2016-02-17 DIAGNOSIS — N183 Chronic kidney disease, stage 3 unspecified: Secondary | ICD-10-CM

## 2016-02-17 DIAGNOSIS — E1151 Type 2 diabetes mellitus with diabetic peripheral angiopathy without gangrene: Secondary | ICD-10-CM

## 2016-02-17 DIAGNOSIS — M542 Cervicalgia: Secondary | ICD-10-CM

## 2016-02-17 LAB — BASIC METABOLIC PANEL WITH GFR
BUN: 51 mg/dL — ABNORMAL HIGH (ref 7–25)
CO2: 23 mmol/L (ref 20–31)
Calcium: 9.2 mg/dL (ref 8.6–10.4)
Chloride: 98 mmol/L (ref 98–110)
Creat: 1.46 mg/dL — ABNORMAL HIGH (ref 0.60–0.88)
GFR, Est African American: 36 mL/min — ABNORMAL LOW (ref >=60)
GFR, Est Non African American: 31 mL/min — ABNORMAL LOW (ref >=60)
Glucose, Bld: 102 mg/dL — ABNORMAL HIGH (ref 65–99)
Potassium: 5.2 mmol/L (ref 3.5–5.3)
Sodium: 130 mmol/L — ABNORMAL LOW (ref 135–146)

## 2016-02-17 LAB — POCT INR: INR: 2.5

## 2016-02-17 NOTE — Progress Notes (Signed)
Careteam: Patient Care Team: Gildardo Cranker, DO as PCP - General (Internal Medicine) Riccardo Dubin, MD as Referring Physician (Cardiology) Alveta Heimlich, MD as Referring Physician (Ophthalmology)  Advanced Directive information Does patient have an advance directive?: Yes, Type of Advance Directive: Healthcare Power of Attorney  Allergies  Allergen Reactions  . Penicillins     Chief Complaint  Patient presents with  . Other    TOC: [facility] stay 11/6 to 11/11     HPI: Patient is a 82 y.o. female seen in the office today for a follow-up after hospital discharge for sepsis from a UTI. Keflex was ordered orally for four days after discharge, the patient took her last dose this morning. Denies frequency, burning, pain, nausea, and vomiting.  INR 4.7 on 11/14, patient held Coumadin 11/14 and 11/15, daughter reports she was told by Clyde Canterbury W at the Coumadin Clinic that the INR would be re-checked today at this visit.  The daughter reports that pharmacy is concerned about the patient's history of peptic ulcers and taking Coumadin. She reports she did have surgery in the summer of 2015.  Blood sugar readings between 145-155 at home. No need for supplemental O2 at home. Maintaining O2 sats > 90% on room air.  Had to cancel appointment with orthopedic for neck pain, as she was in the hospital on the day of her appointment.  Review of Systems:  Review of Systems  Constitutional: Negative for chills and fever.  HENT: Negative for nosebleeds.   Respiratory: Positive for shortness of breath (overall has improved).   Cardiovascular: Negative for chest pain.  Gastrointestinal: Negative for blood in stool, nausea and vomiting.  Genitourinary: Negative for difficulty urinating, dysuria, frequency, hematuria and vaginal bleeding.  Musculoskeletal: Positive for neck pain (missed appt with ortho).    Past Medical History:  Diagnosis Date  . Anemia    previous blood transfusions    . Arthritis    "all over"  . Asthma   . Bradycardia    requiring previous d/c of BB and reduction of amiodarone  . CAD (coronary artery disease)    nonobstructive per notes  . Chronic diastolic CHF (congestive heart failure) ([place])   . CKD (chronic kidney disease), stage III   . Complication of blood transfusion    "got the wrong blood type at [facility] in ~ 2015; no adverse reaction that we are aware of"/daughter, [name] (01/27/2016)  . COPD (chronic obstructive pulmonary disease) ([place])   . Depression    "light case"  . DVT (deep venous thrombosis) ([place]) 01/2016   a. LLE DVT 01/2016 - switched from Eliquis to Coumadin.  . Gastric stenosis    a. s/p stomach tube  . GERD (gastroesophageal reflux disease)   . History of blood transfusion    "several" (01/27/2016)  . History of stomach ulcers   . Hyperlipidemia   . Hypertension   . Hypothyroidism   . Paraesophageal hernia   . Perforated gastric ulcer ([place])   . Seasonal allergies   . SIADH (syndrome of inappropriate ADH production) ([place])    [name] 01/10/2015  . Small bowel obstruction    "I don't know how many" (01/11/2015)  . Stroke ([place])    "light one"  . Type II diabetes mellitus ([place])    "related to prednisone use  for > 20 yr; once predinose stopped; no more DM RX" (01/27/2016)  . Ventral hernia with bowel obstruction    Past Surgical History:  Procedure Laterality Date  .  CATARACT EXTRACTION W/ INTRAOCULAR LENS  IMPLANT, BILATERAL    . CHOLECYSTECTOMY OPEN    . COLECTOMY    . ESOPHAGOGASTRODUODENOSCOPY N/A 01/19/2014   Procedure: ESOPHAGOGASTRODUODENOSCOPY (EGD);  Surgeon: Irene Shipper, MD;  Location: [facility];  Service: Endoscopy;  Laterality: N/A;  . ESOPHAGOGASTRODUODENOSCOPY N/A 01/20/2014   Procedure: ESOPHAGOGASTRODUODENOSCOPY (EGD);  Surgeon: Irene Shipper, MD;  Location: [facility];  Service: Endoscopy;  Laterality: N/A;  . ESOPHAGOGASTRODUODENOSCOPY N/A 03/19/2014   Procedure: ESOPHAGOGASTRODUODENOSCOPY  (EGD);  Surgeon: Milus Banister, MD;  Location: [facility];  Service: Endoscopy;  Laterality: N/A;  . ESOPHAGOGASTRODUODENOSCOPY N/A 07/08/2015   Procedure: ESOPHAGOGASTRODUODENOSCOPY (EGD);  Surgeon: Doran Stabler, MD;  Location: [facility] ENDOSCOPY;  Service: Endoscopy;  Laterality: N/A;  . ESOPHAGOGASTRODUODENOSCOPY (EGD) WITH PROPOFOL N/A 09/15/2015   Procedure: ESOPHAGOGASTRODUODENOSCOPY (EGD) WITH PROPOFOL;  Surgeon: Manus Gunning, MD;  Location: [facility] ENDOSCOPY;  Service: Gastroenterology;  Laterality: N/A;  . GASTROJEJUNOSTOMY     hx/notes 01/10/2015  . GASTROJEJUNOSTOMY N/A 09/23/2015   Procedure: OPEN GASTROJEJUNOSTOMY TUBE PLACEMENT;  Surgeon: Arta Bruce Kinsinger, MD;  Location: [facility] ORS;  Service: General;  Laterality: N/A;  . GLAUCOMA SURGERY Bilateral   . HERNIA REPAIR  2015  . IR GENERIC HISTORICAL  01/07/2016   IR GJ TUBE CHANGE 01/07/2016 Jacqulynn Cadet, MD [facility]INTER[facility]  . IR GENERIC HISTORICAL  01/27/2016   IR MECH REMOV OBSTRUC MAT ANY COLON TUBE W/FLUORO 01/27/2016 Markus Daft, MD [facility]  . IR GENERIC HISTORICAL  02/07/2016   IR PATIENT EVAL TECH 0-60 MINS Darrell K Allred, PA-C [facility]-INTERV [facility]  . IR GENERIC HISTORICAL  02/08/2016   IR GJ TUBE CHANGE 02/08/2016 Greggory Keen, MD [facility]  . LAPAROTOMY N/A 01/20/2015   Procedure: EXPLORATORY LAPAROTOMY;  Surgeon: Coralie Keens, MD;  Location: [facility];  Service: General;  Laterality: N/A;  . LYSIS OF ADHESION N/A 01/20/2015   Procedure: LYSIS OF ADHESIONS < 1 HOUR;  Surgeon: Coralie Keens, MD;  Location: [facility];  Service: General;  Laterality: N/A;  . TONSILLECTOMY    . TUBAL LIGATION    . VENTRAL HERNIA REPAIR  2015   incarcerated ventral hernia ([facility] 09/2013)/notes 01/10/2015   Social History:   reports that she has never smoked. She has quit using smokeless tobacco. Her smokeless tobacco use included Snuff. She reports that she does not drink alcohol or use drugs.  Family History  Problem Relation Age of  Onset  . Stroke Mother   . Hypertension Mother   . Diabetes Brother   . Heart attack Neg Hx     Medications: Patient's Medications  New Prescriptions   No medications on file  Previous Medications   ACETAMINOPHEN (TYLENOL) 325 MG TABLET    Take 650 mg by mouth 2 (two) times daily as needed for mild pain.    ADVAIR DISKUS 250-50 MCG/DOSE AEPB    INHALE 1 PUFF INTO THE LUNGS 2 (TWO) TIMES DAILY AS NEEDED FOR SHORTNESS OF BREATH   AMINO ACIDS-PROTEIN HYDROLYS PO    Take 30 mLs by mouth 2 (two) times daily.   AMIODARONE (PACERONE) 200 MG TABLET    Take 200 mg by mouth 2 (two) times daily.   AMLODIPINE (NORVASC) 10 MG TABLET    TAKE 1 TABLET BY MOUTH DAILY FOR HYPERTENSION   ATORVASTATIN (LIPITOR) 10 MG TABLET    Take 10 mg by mouth daily.   B COMPLEX VITAMINS TABLET    Take 1 tablet by mouth daily.    BISACODYL (DULCOLAX) 10  MG SUPPOSITORY    Place 1 suppository (10 mg total) rectally daily as needed for moderate constipation.   CALCIUM-VITAMIN D (OSCAL WITH D) 500-200 MG-UNIT PER TABLET    Take 1 tablet by mouth daily with breakfast.    CEPHALEXIN (KEFLEX) 500 MG CAPSULE    Take 1 capsule (500 mg total) by mouth 2 (two) times daily.   CETIRIZINE (ZYRTEC) 10 MG TABLET    Take 10 mg by mouth daily as needed for allergies or rhinitis.   CHLORHEXIDINE (PERIDEX) 0.12 % SOLUTION    Use as directed 15 mLs in the mouth or throat 2 (two) times daily.   DICLOFENAC SODIUM (VOLTAREN) 1 % GEL    Apply 2 g topically daily as needed (for pain).    FEEDING SUPPLEMENT (BOOST / RESOURCE BREEZE) LIQD    Take 1 Container by mouth daily.   FLUTICASONE (FLONASE) 50 MCG/ACT NASAL SPRAY    Place 2 sprays into both nostrils daily.   GABAPENTIN (NEURONTIN) 300 MG CAPSULE    Take 1 capsule (300 mg total) by mouth 3 (three) times daily.   HYDRALAZINE (APRESOLINE) 50 MG TABLET    Take 1 tablet (50 mg total) by mouth 3 (three) times daily.   IPRATROPIUM-ALBUTEROL (DUONEB) 0.5-2.5 (3) MG/3ML SOLN    Take 3 mLs by  nebulization every 6 (six) hours as needed (sob). Use 3 times daily x 4 days then every 6 hours as needed.   ISOSORBIDE MONONITRATE (IMDUR) 30 MG 24 HR TABLET    TAKE 1 TABLET (30 MG TOTAL) BY MOUTH DAILY.   LATANOPROST (XALATAN) 0.005 % OPHTHALMIC SOLUTION    Place 1 drop into both eyes at bedtime.   LEVALBUTEROL (XOPENEX HFA) 45 MCG/ACT INHALER    Inhale 2 puffs into the lungs every 6 (six) hours as needed for wheezing or shortness of breath.   LEVOTHYROXINE (SYNTHROID, LEVOTHROID) 137 MCG TABLET    Take 1 tablet (137 mcg total) by mouth daily before breakfast.   MALTODEXTRIN-XANTHAN GUM (RESOURCE THICKENUP CLEAR) POWD    Take 120 g by mouth as needed (use to thicken liquids.).   MULTIPLE VITAMIN (MULTIVITAMIN WITH MINERALS) TABS TABLET    Take 1 tablet by mouth daily.   NUTRITIONAL SUPPLEMENTS (FEEDING SUPPLEMENT, OSMOLITE 1.5 CAL,) LIQD    Place 1,000 mLs into feeding tube daily.   ONDANSETRON (ZOFRAN) 4 MG TABLET    Take 1 tablet (4 mg total) by mouth every 8 (eight) hours as needed for nausea or vomiting.   PANTOPRAZOLE (PROTONIX) 40 MG TABLET    TAKE 1 TABLET BY MOUTH TWICE A DAY   POLYETHYL GLYCOL-PROPYL GLYCOL 0.4-0.3 % SOLN    Place 1 drop into both eyes 4 (four) times daily.    POLYETHYLENE GLYCOL POWDER (GLYCOLAX/MIRALAX) POWDER    MIX 17GRAMS IN 8 OUNCES OF LIQUID AND DRINK TWICE DAILY AS NEEDED   RESTASIS 0.05 % OPHTHALMIC EMULSION    USE 1 DROP INTO BOTH EYES TWICE DAILY   SERTRALINE (ZOLOFT) 100 MG TABLET    Take one tablet by mouth once daily for depression   SIMETHICONE (MYLICON) 80 MG CHEWABLE TABLET    Chew 80 mg by mouth 4 (four) times daily as needed for flatulence.   TRAMADOL (ULTRAM) 50 MG TABLET    Take 50 mg by mouth daily as needed for moderate pain.    WARFARIN (COUMADIN) 2.5 MG TABLET    Take as directed by Coumadin Clinic.   WATER FOR IRRIGATION, STERILE (FREE WATER) SOLN  Place 230 mLs into feeding tube 3 (three) times daily.  Modified Medications   No medications  on file  Discontinued Medications   AMIODARONE (PACERONE) 200 MG TABLET    Take 1 tablet (200 mg total) by mouth 2 (two) times daily.   WATER FOR IRRIGATION, STERILE (FREE WATER) SOLN    Place 60 mLs into feeding tube every 6 (six) hours.     Physical Exam:  Vitals:   02/17/16 1314  BP: 122/64  Pulse: 70  Resp: 17  Temp: 98.2 F (36.8 C)  TempSrc: Oral  SpO2: 99%  Weight: 141 lb 12.8 oz (64.3 kg)  Height: '5\' 6"'  (1.676 m)   Body mass index is 22.89 kg/m.  Physical Exam  Constitutional: She appears well-developed and well-nourished.  HENT:  Head: Normocephalic.  Neck: Normal range of motion. Neck supple.  Cardiovascular: Normal rate.  An irregular rhythm present.  Pulmonary/Chest: Effort normal and breath sounds normal.  Abdominal: Soft. Bowel sounds are normal.  Neurological: She is alert.  Skin: Skin is warm and dry.  Psychiatric: She has a normal mood and affect.    Labs reviewed: Basic Metabolic Panel:  Recent Labs  09/16/15 0322  10/06/15 1151  11/09/15 0854  11/11/15 0445  12/24/15 0924  01/25/16 1759  02/10/16 0448 02/11/16 0558 02/14/16 1454  NA 137  < > 135  < > 133*  < > 133*  < >  --   < >  --   < > 133* 133* 130*  K 3.5  < > 4.8  < > 4.8  < > 4.2  < >  --   < >  --   < > 4.6 4.5 5.4*  CL 110  < > 96  < > 103  < > 104  < >  --   < >  --   < > 104 104 97*  CO2 22  < > 26  < > 20*  < > 20*  < >  --   < >  --   < > 21* 20* 23  GLUCOSE 83  < > 94  < > 135*  < > 157*  < >  --   < >  --   < > 244* 210* 172*  BUN 7  < > 25  < > 35*  < > 39*  < >  --   < >  --   < > 37* 36* 55*  CREATININE 1.01*  < > 1.66*  < > 1.29*  < > 1.36*  < >  --   < >  --   < > 1.19* 1.17* 1.64*  CALCIUM 8.0*  < > 9.4  < > 9.8  < > 9.1  < >  --   < >  --   < > 9.0 9.1 9.1  MG 1.7  < > 1.8  --  2.0  --  1.8  --   --   --   --   --   --   --   --   PHOS 1.8*  --   --   --   --   --   --   --   --   --   --   --   --   --   --   TSH  --   < >  --   < >  --   --   --   --  42.44*  --   8.794*  --   --   --  7.34*  < > = values in this interval not displayed. Liver Function Tests:  Recent Labs  11/06/15 1612 01/24/16 1633 02/07/16 2118  AST 26 39 24  ALT 23 26 41  ALKPHOS 49 68 59  BILITOT 0.5 0.3 0.5  PROT 7.6 8.1 7.5  ALBUMIN 3.6 3.8 3.8    Recent Labs  08/26/15 0630 09/11/15 2351 11/06/15 1612  LIPASE 18 81* 21   No results for input(s): AMMONIA in the last 8760 hours. CBC:  Recent Labs  01/24/16 1633  02/07/16 2118  02/10/16 0448 02/11/16 0558 02/14/16 1454  WBC 6.4  < > 16.6*  < > 9.1 10.0 9.6  NEUTROABS 3.8  --  14.9*  --   --  8.3*  --   HGB 9.7*  < > 10.7*  < > 8.9* 9.5* 9.2*  HCT 28.9*  < > 31.0*  < > 26.2* 27.7* 28.7*  MCV 86.8  < > 84.5  < > 84.8 84.7 90.3  PLT 370  < > 339  < > 292 310 360  < > = values in this interval not displayed. Lipid Panel:  Recent Labs  04/07/15 1015  CHOL 126  HDL 69  LDLCALC 36  TRIG 107  CHOLHDL 1.8   TSH:  Recent Labs  12/24/15 0924 01/25/16 1759 02/14/16 1454  TSH 42.44* 8.794* 7.34*   A1C: Lab Results  Component Value Date   HGBA1C 6.5 (H) 08/28/2015     Assessment/Plan 1. Chronic atrial fibrillation (HCC) - Rate controlled - INR 2.5 today - continue instructions per Coumadin clinic - Continue Amiodarone 200 mg BID. - POCT INR  2. Urinary tract infection without hematuria, site unspecified - Resolved. Without symptoms at this time, has completed keflex  3. Essential hypertension, benign - BP controlled, 122/64 today.  - Continue Norvasc, Imdur, and hydralazine.  - BMP with eGFR  4. CKD (chronic kidney disease) stage 3, GFR 30-59 ml/min - BMP drawn today to evaluate kidney function.   5. COPD exacerbation ([place]) - Breathing better. - Maintaining O2 sat > 90% on room air. - Continue Xopenex, duonebs, and Advair.  6. DM (diabetes mellitus) type II uncontrolled, periph vascular disorder (HCC) - Blood glucose readings at home better (145-155) than when in the hospital. -  HgbA1c 6.5 in May 2017.  - Continue Neurontin for neuropathy.   7. Neck pain - Recommended to reschedule appointment with Dr. Lorin Mercy (orthopedic).   [name]. Harle Battiest  [facility] [phone] 8 am - 5 pm) [phone] (after hours)

## 2016-02-17 NOTE — Patient Instructions (Signed)
Reschedule appointment with Dr. Lorin Mercy.  INR today 2.5 - continue instructions per Coumadin Clinic

## 2016-02-17 NOTE — Consult Note (Signed)
This Probation officer received a call from the patient's daughter Shannon [name] from telephone number [phone], identified herself.  HIPAA verified and patient has given permission to speak with her daughter.  Daughter wanted to know more about how [facility] could assist them in the role of weekly lab work for her Coumadin management.  Explained that [facility] could assist with transportation needs, pharmacy follow up and evaluate her for monthly home visits.  Daughter states, "I can get her to her appointments but was wondering if there was a way to have her labs done a different way."  Explained that she may touch bases with her provider to see if there are alternatives for getting this done.  She then states, "well if you all can't do this weekly I guess that was the only reason we would have needed you because I do the rest my self.  Thank you."  Encouraged her to call back if she has other questions or her situation changes and needs [place] Management.   [name], RN BSN [facility] Liaison  [phone] business mobile phone Toll free office [phone]

## 2016-02-18 ENCOUNTER — Telehealth: Payer: Self-pay

## 2016-02-18 DIAGNOSIS — J9601 Acute respiratory failure with hypoxia: Secondary | ICD-10-CM | POA: Diagnosis not present

## 2016-02-18 DIAGNOSIS — I480 Paroxysmal atrial fibrillation: Secondary | ICD-10-CM | POA: Diagnosis not present

## 2016-02-18 MED ORDER — HYDROCODONE-ACETAMINOPHEN 5-325 MG PO TABS: 1.0000 | ORAL_TABLET | Freq: Four times a day (QID) | ORAL | 0 refills | Status: DC | PRN

## 2016-02-18 NOTE — Telephone Encounter (Signed)
Tramadol

## 2016-02-18 NOTE — Telephone Encounter (Signed)
HA likely related to cervical spine changes. Recommend norco to treat pain in neck and this will help HA. Norco 5/325 #30 take 1 po q6hr prn pain. No rf - Rx printed

## 2016-02-18 NOTE — Telephone Encounter (Signed)
Spoke with patients daughter regarding pain meds Dr. Eulas Post wrote prescription for patient to pick up

## 2016-02-18 NOTE — Telephone Encounter (Signed)
Patient with ongoing headaches and the pain is increasing, patient would like to know if anything stronger can be prescribed, Please advise    I also discussed concerns related to phone note on 02/16/16 from [facility] Nurse. Patient's daughter would not like a referral to the lung specialist at this time. Patient's daughter would also like to hold off on Occupational Therapy orders until seen by Dr.Carter in December.

## 2016-02-18 NOTE — Telephone Encounter (Signed)
Ok for PT/OT; may refer to pulmonary for SOB

## 2016-02-18 NOTE — Telephone Encounter (Signed)
What is she taking for her headaches? This may be related to cervical spine pain as well. To makes sure to make appt with ortho

## 2016-02-21 ENCOUNTER — Other Ambulatory Visit: Payer: Self-pay

## 2016-02-21 ENCOUNTER — Ambulatory Visit (INDEPENDENT_AMBULATORY_CARE_PROVIDER_SITE_OTHER): Payer: Medicare Other | Admitting: Internal Medicine

## 2016-02-21 DIAGNOSIS — Z5181 Encounter for therapeutic drug level monitoring: Secondary | ICD-10-CM

## 2016-02-21 DIAGNOSIS — J9601 Acute respiratory failure with hypoxia: Secondary | ICD-10-CM | POA: Diagnosis not present

## 2016-02-21 DIAGNOSIS — I480 Paroxysmal atrial fibrillation: Secondary | ICD-10-CM | POA: Diagnosis not present

## 2016-02-21 LAB — POCT INR: INR: 1.7

## 2016-02-21 MED ORDER — AMLODIPINE BESYLATE 10 MG PO TABS
ORAL_TABLET | ORAL | 1 refills | Status: DC
Start: 2016-02-21 — End: 2016-03-14

## 2016-02-23 ENCOUNTER — Encounter: Payer: Self-pay | Admitting: Internal Medicine

## 2016-02-23 ENCOUNTER — Ambulatory Visit (INDEPENDENT_AMBULATORY_CARE_PROVIDER_SITE_OTHER): Payer: Medicare Other | Admitting: Internal Medicine

## 2016-02-23 VITALS — BP 102/54 | HR 85 | Ht 66.0 in | Wt 147.0 lb

## 2016-02-23 DIAGNOSIS — I4892 Unspecified atrial flutter: Secondary | ICD-10-CM | POA: Diagnosis not present

## 2016-02-23 DIAGNOSIS — I481 Persistent atrial fibrillation: Secondary | ICD-10-CM

## 2016-02-23 DIAGNOSIS — I5032 Chronic diastolic (congestive) heart failure: Secondary | ICD-10-CM | POA: Diagnosis not present

## 2016-02-23 DIAGNOSIS — I4819 Other persistent atrial fibrillation: Secondary | ICD-10-CM

## 2016-02-23 DIAGNOSIS — J9601 Acute respiratory failure with hypoxia: Secondary | ICD-10-CM | POA: Diagnosis not present

## 2016-02-23 DIAGNOSIS — I480 Paroxysmal atrial fibrillation: Secondary | ICD-10-CM | POA: Diagnosis not present

## 2016-02-23 MED ORDER — DILTIAZEM HCL ER COATED BEADS 120 MG PO CP24: 120.0000 mg | ORAL_CAPSULE | Freq: Every day | ORAL | 3 refills | Status: DC

## 2016-02-23 NOTE — Patient Instructions (Signed)
Medication Instructions:  Your physician has recommended you make the following change in your medication:  1) Decrease Amiodarone to 200 mg daily 2) Start Diltiazem 120 mg daily   Labwork: None ordered   Testing/Procedures: None ordered   Follow-Up: Your physician recommends that you schedule a follow-up appointment as scheduled   Any Other Special Instructions Will Be Listed Below (If Applicable).     If you need a refill on your cardiac medications before your next appointment, please call your pharmacy.

## 2016-02-26 NOTE — Progress Notes (Signed)
Electrophysiology Office Note   Date:  02/26/2016   ID:  Shannon [name], DOB Jun 12, 1923, MRN [id number]  PCP:  Kirt Boys, DO  Cardiologist:  Dr Delton See Primary Electrophysiologist: Hillis Range, MD    Chief Complaint  Patient presents with  . Atrial Fibrillation     History of Present Illness: Shannon [name] is a 82 y.o. female who presents today for electrophysiology evaluation.   She is a pleasant elderly female with multiple recent hospitalizations.  She has persistent afib as well as chronic renal failure, DM, diastolic dysfunction, COPD, and hyponatremia.  She has had prior sinus bradycardia with labetolol.  This has resolved off of the medicine.  She has also had afib with elevated V rates.  She is chronically on amiodarone.  Most recently, she developed recurrent afib in the setting of urosepsis.  She recently had a DVT and is now on coumadin. She is frail.  Activity is limited.  She has occasional SOB.  She says she hasn't felt good in years.  She has a lot of neck and back pain.  She has several bad teeth which need to be pulled.  She has been told that she would have to wait 3 months to have her teeth pulled due to anticoagulation required for recent DVT. At this time, she denies CP, dizziness, presyncope, syncope, bleeding, or neurologic sequela. The patient is tolerating medications without difficulties and is otherwise without complaint today.    Past Medical History:  Diagnosis Date  . Anemia    previous blood transfusions  . Arthritis    "all over"  . Asthma   . Bradycardia    requiring previous d/c of BB and reduction of amiodarone  . CAD (coronary artery disease)    nonobstructive per notes  . Chronic diastolic CHF (congestive heart failure) (HCC)   . CKD (chronic kidney disease), stage III   . Complication of blood transfusion    "got the wrong blood type at [facility] in ~ 2015; no adverse reaction that we are aware of"/daughter, [name] (01/27/2016)  . COPD  (chronic obstructive pulmonary disease) (HCC)   . Depression    "light case"  . DVT (deep venous thrombosis) (HCC) 01/2016   a. LLE DVT 01/2016 - switched from Eliquis to Coumadin.  . Gastric stenosis    a. s/p stomach tube  . GERD (gastroesophageal reflux disease)   . History of blood transfusion    "several" (01/27/2016)  . History of stomach ulcers   . Hyperlipidemia   . Hypertension   . Hypothyroidism   . Paraesophageal hernia   . Perforated gastric ulcer (HCC)   . Seasonal allergies   . SIADH (syndrome of inappropriate ADH production) (HCC)    [name] 01/10/2015  . Small bowel obstruction    "I don't know how many" (01/11/2015)  . Stroke (HCC)    "light one"  . Type II diabetes mellitus (HCC)    "related to prednisone use  for > 20 yr; once predinose stopped; no more DM RX" (01/27/2016)  . Ventral hernia with bowel obstruction    Past Surgical History:  Procedure Laterality Date  . CATARACT EXTRACTION W/ INTRAOCULAR LENS  IMPLANT, BILATERAL    . CHOLECYSTECTOMY OPEN    . COLECTOMY    . ESOPHAGOGASTRODUODENOSCOPY N/A 01/19/2014   Procedure: ESOPHAGOGASTRODUODENOSCOPY (EGD);  Surgeon: Hilarie Fredrickson, MD;  Location: [facility];  Service: Endoscopy;  Laterality: N/A;  . ESOPHAGOGASTRODUODENOSCOPY N/A 01/20/2014   Procedure: ESOPHAGOGASTRODUODENOSCOPY (EGD);  Surgeon: Wilhemina Bonito  Marina Goodell, MD;  Location: [facility];  Service: Endoscopy;  Laterality: N/A;  . ESOPHAGOGASTRODUODENOSCOPY N/A 03/19/2014   Procedure: ESOPHAGOGASTRODUODENOSCOPY (EGD);  Surgeon: Rachael Fee, MD;  Location: [facility];  Service: Endoscopy;  Laterality: N/A;  . ESOPHAGOGASTRODUODENOSCOPY N/A 07/08/2015   Procedure: ESOPHAGOGASTRODUODENOSCOPY (EGD);  Surgeon: Sherrilyn Rist, MD;  Location: [facility] END[facility]COPY;  Service: Endoscopy;  Laterality: N/A;  . ESOPHAGOGASTRODUODENOSCOPY (EGD) WITH PROPOFOL N/A 09/15/2015   Procedure: ESOPHAGOGASTRODUODENOSCOPY (EGD) WITH PROPOFOL;  Surgeon: Ruffin Frederick, MD;   Location: [facility];  Service: Gastroenterology;  Laterality: N/A;  . GASTROJEJUNOSTOMY     hx/notes 01/10/2015  . GASTROJEJUNOSTOMY N/A 09/23/2015   Procedure: OPEN GASTROJEJUNOSTOMY TUBE PLACEMENT;  Surgeon: De Blanch Kinsinger, MD;  Location: [facility] ORS;  Service: General;  Laterality: N/A;  . GLAUCOMA SURGERY Bilateral   . HERNIA REPAIR  2015  . IR GENERIC HISTORICAL  01/07/2016   IR GJ TUBE CHANGE 01/07/2016 Malachy Moan, MD [facility]  . IR GENERIC HISTORICAL  01/27/2016   IR MECH REMOV OBSTRUC MAT ANY COLON TUBE W/FLUORO 01/27/2016 Richarda Overlie, MD [facility]  . IR GENERIC HISTORICAL  02/07/2016   IR PATIENT EVAL TECH 0-60 MINS Darrell K Amanuel Sinkfield, PA-C [facility]  . IR GENERIC HISTORICAL  02/08/2016   IR GJ TUBE CHANGE 02/08/2016 Berdine Dance, MD [facility]  . LAPAROTOMY N/A 01/20/2015   Procedure: EXPLORATORY LAPAROTOMY;  Surgeon: Abigail Miyamoto, MD;  Location: [facility] OR;  Service: General;  Laterality: N/A;  . LYSIS OF ADHESION N/A 01/20/2015   Procedure: LYSIS OF ADHESIONS < 1 HOUR;  Surgeon: Abigail Miyamoto, MD;  Location: [facility] OR;  Service: General;  Laterality: N/A;  . TONSILLECTOMY    . TUBAL LIGATION    . VENTRAL HERNIA REPAIR  2015   incarcerated ventral hernia ([facility] 09/2013)/notes 01/10/2015     Current Outpatient Prescriptions  Medication Sig Dispense Refill  . acetaminophen (TYLENOL) 325 MG tablet Take 650 mg by mouth 2 (two) times daily as needed for mild pain.     [name] ADVAIR DISKUS 250-50 MCG/DOSE AEPB INHALE 1 PUFF INTO THE LUNGS 2 (TWO) TIMES DAILY AS NEEDED FOR SHORTNESS OF BREATH  3  . AMINO ACIDS-PROTEIN HYDROLYS PO Take 30 mLs by mouth 2 (two) times daily.    [name] amiodarone (PACERONE) 200 MG tablet Take 200 mg by mouth daily.    [name] amLODipine (NORVASC) 10 MG tablet TAKE 1 TABLET BY MOUTH DAILY FOR HYPERTENSION 90 tablet 1  . atorvastatin (LIPITOR) 10 MG tablet Take 10 mg by mouth daily.    [name] b complex vitamins tablet Take 1 tablet by mouth daily.     .  bisacodyl (DULCOLAX) 10 MG suppository Place 1 suppository (10 mg total) rectally daily as needed for moderate constipation. 12 suppository 0  . calcium-vitamin D (OSCAL WITH D) 500-200 MG-UNIT per tablet Take 1 tablet by mouth daily with breakfast.     . cetirizine (ZYRTEC) 10 MG tablet Take 10 mg by mouth daily as needed for allergies or rhinitis.    . chlorhexidine (PERIDEX) 0.12 % solution Use as directed 15 mLs in the mouth or throat 2 (two) times daily.    . diclofenac sodium (VOLTAREN) 1 % GEL Apply 2 g topically daily as needed (for pain).     . feeding supplement (BOOST / RESOURCE BREEZE) LIQD Take 1 Container by mouth daily.    . fluticasone (FLONASE) 50 MCG/ACT nasal spray Place 2 sprays into both nostrils daily. 16 g 3  . gabapentin (  NEURONTIN) 300 MG capsule Take 1 capsule (300 mg total) by mouth 3 (three) times daily. 90 capsule 12  . hydrALAZINE (APRESOLINE) 50 MG tablet Take 1 tablet (50 mg total) by mouth 3 (three) times daily. 120 tablet 0  . HYDROcodone-acetaminophen (NORCO) 5-325 MG tablet Take 1 tablet by mouth every 6 (six) hours as needed for moderate pain. 30 tablet 0  . ipratropium-albuterol (DUONEB) 0.5-2.5 (3) MG/3ML SOLN Take 3 mLs by nebulization every 6 (six) hours as needed (sob). Use 3 times daily x 4 days then every 6 hours as needed. 360 mL 4  . isosorbide mononitrate (IMDUR) 30 MG 24 hr tablet TAKE 1 TABLET (30 MG TOTAL) BY MOUTH DAILY.  2  . latanoprost (XALATAN) 0.005 % ophthalmic solution Place 1 drop into both eyes at bedtime. 2.5 mL 12  . levalbuterol (XOPENEX HFA) 45 MCG/ACT inhaler Inhale 2 puffs into the lungs every 6 (six) hours as needed for wheezing or shortness of breath. 1 Inhaler 12  . levothyroxine (SYNTHROID, LEVOTHROID) 137 MCG tablet Take 1 tablet (137 mcg total) by mouth daily before breakfast. 30 tablet 2  . Maltodextrin-Xanthan Gum (RESOURCE THICKENUP CLEAR) POWD Take 120 g by mouth as needed (use to thicken liquids.). 20 Can 0  . Multiple  Vitamin (MULTIVITAMIN WITH MINERALS) TABS tablet Take 1 tablet by mouth daily.    . Nutritional Supplements (FEEDING SUPPLEMENT, OSMOLITE 1.5 CAL,) LIQD Place 1,000 mLs into feeding tube daily. 1000 mL 3  . ondansetron (ZOFRAN) 4 MG tablet Take 1 tablet (4 mg total) by mouth every 8 (eight) hours as needed for nausea or vomiting. 20 tablet 0  . pantoprazole (PROTONIX) 40 MG tablet TAKE 1 TABLET BY MOUTH TWICE A DAY 60 tablet 5  . Polyethyl Glycol-Propyl Glycol 0.4-0.3 % SOLN Place 1 drop into both eyes 4 (four) times daily.     . polyethylene glycol powder (GLYCOLAX/MIRALAX) powder MIX 17GRAMS IN 8 OUNCES OF LIQUID AND DRINK TWICE DAILY AS NEEDED 3162 g 1  . RESTASIS 0.05 % ophthalmic emulsion USE 1 DROP INTO BOTH EYES TWICE DAILY 60 mL 0  . sertraline (ZOLOFT) 100 MG tablet Take one tablet by mouth once daily for depression 90 tablet 1  . simethicone (MYLICON) 80 MG chewable tablet Chew 80 mg by mouth 4 (four) times daily as needed for flatulence.    . traMADol (ULTRAM) 50 MG tablet Take 50 mg by mouth daily as needed for moderate pain.     [name] warfarin (COUMADIN) 2.5 MG tablet Take as directed by [facility]. 30 tablet 1  . diltiazem (CARDIZEM CD) 120 MG 24 hr capsule Take 1 capsule (120 mg total) by mouth daily. 90 capsule 3  . Water For Irrigation, Sterile (FREE WATER) SOLN Place 230 mLs into feeding tube 3 (three) times daily.     No current facility-administered medications for this visit.     Allergies:   Penicillins   Social History:  The patient  reports that she has never smoked. She has quit using smokeless tobacco. Her smokeless tobacco use included Snuff. She reports that she does not drink alcohol or use drugs.   Family History:  The patient's  family history includes Diabetes in her brother; Hypertension in her mother; Stroke in her mother.    ROS:  Please see the history of present illness.   All other systems are reviewed and negative.    PHYSICAL EXAM: VS:  BP (!)  102/54   Pulse 85   Ht 5\' 6"  (  1.676 m)   Wt 147 lb (66.7 kg)   BMI 23.73 kg/m  , BMI Body mass index is 23.73 kg/m. GEN: elderly and frail, chronically ill, in no acute distress  HEENT: normal  Neck: no JVD, carotid bruits, or masses Cardiac: tachycardic irregular Respiratory:  clear to auscultation bilaterally, normal work of breathing GI: soft, nontender, nondistended, + BS MS: diffuse muscle atrophy  Skin: warm and dry  Neuro:  Strength and sensation are intact Psych: euthymic mood, full affect  EKG:  EKG is ordered today. The ekg ordered today shows atrial flutter, V rate 85 bpm   Recent Labs: 11/11/2015: Magnesium 1.8 02/07/2016: ALT 41 02/14/2016: Brain Natriuretic Peptide 40.9; Hemoglobin 9.2; Platelets 360; TSH 7.34 02/17/2016: BUN 51; Creat 1.46; Potassium 5.2; Sodium 130    Lipid Panel     Component Value Date/Time   CHOL 126 04/07/2015 1015   TRIG 107 04/07/2015 1015   HDL 69 04/07/2015 1015   CHOLHDL 1.8 04/07/2015 1015   CHOLHDL 1.9 12/24/2013 0456   VLDL 26 12/24/2013 0456   LDLCALC 36 04/07/2015 1015     Wt Readings from Last 3 Encounters:  02/23/16 147 lb (66.7 kg)  02/17/16 141 lb 12.8 oz (64.3 kg)  02/14/16 138 lb (62.6 kg)      Other studies Reviewed: Additional studies/ records that were reviewed today include: hospital records, epic office notes, ekgs  Review of the above records today demonstrates: as above   ASSESSMENT AND PLAN:  1.  Afib/ atrial flutter Moderate LA enlargement Will reduce amiodarone to 200mg  daily Consider cardioversion if arrhythmia persists upon follow-up with Dr Delton See Add low dose diltiazem CD Continue coumadin  2. Prior bradycardia Sinus bradycardia previously noted on labetalol Will add very low dose diltiazem Given advanced age and comorbidities, would avoid pacing long term  3. Chronic diastolic dysfunction Stable  4. Tooth ache I have advised that given the significant impact on her overall health  state that she go ahead and proceed with dental evaluation/ extraction.  We should be able to manage periprocedurally with anticoagulation in a safe way.  I worry that waiting 3 months will not be good for her.  Her prognosis is poor.  Ultimately, a palliative approach is best. She has close follow-up with Dr Delton See arranged.  I will see as needed going forward.   Signed, Hillis Range, MD    [facility] [address] [place] [address] [phone] (office) [phone] (fax)

## 2016-02-28 ENCOUNTER — Encounter (HOSPITAL_COMMUNITY): Payer: Self-pay | Admitting: Emergency Medicine

## 2016-02-28 ENCOUNTER — Telehealth: Payer: Self-pay | Admitting: *Deleted

## 2016-02-28 ENCOUNTER — Emergency Department (HOSPITAL_COMMUNITY)
Admission: EM | Admit: 2016-02-28 | Discharge: 2016-02-28 | Disposition: A | Discharge status: Home / Self Care | Payer: Medicare Other | Source: Home / Self Care | Attending: Emergency Medicine | Admitting: Emergency Medicine

## 2016-02-28 ENCOUNTER — Emergency Department (HOSPITAL_COMMUNITY): Payer: Medicare Other

## 2016-02-28 DIAGNOSIS — I5032 Chronic diastolic (congestive) heart failure: Secondary | ICD-10-CM | POA: Insufficient documentation

## 2016-02-28 DIAGNOSIS — I82402 Acute embolism and thrombosis of unspecified deep veins of left lower extremity: Secondary | ICD-10-CM | POA: Diagnosis not present

## 2016-02-28 DIAGNOSIS — R51 Headache: Principal | ICD-10-CM

## 2016-02-28 DIAGNOSIS — Z8673 Personal history of transient ischemic attack (TIA), and cerebral infarction without residual deficits: Secondary | ICD-10-CM | POA: Insufficient documentation

## 2016-02-28 DIAGNOSIS — E119 Type 2 diabetes mellitus without complications: Secondary | ICD-10-CM

## 2016-02-28 DIAGNOSIS — R0602 Shortness of breath: Secondary | ICD-10-CM | POA: Diagnosis not present

## 2016-02-28 DIAGNOSIS — Z7901 Long term (current) use of anticoagulants: Secondary | ICD-10-CM | POA: Insufficient documentation

## 2016-02-28 DIAGNOSIS — E039 Hypothyroidism, unspecified: Secondary | ICD-10-CM | POA: Insufficient documentation

## 2016-02-28 DIAGNOSIS — E871 Hypo-osmolality and hyponatremia: Secondary | ICD-10-CM | POA: Insufficient documentation

## 2016-02-28 DIAGNOSIS — I2510 Atherosclerotic heart disease of native coronary artery without angina pectoris: Secondary | ICD-10-CM

## 2016-02-28 DIAGNOSIS — N183 Chronic kidney disease, stage 3 (moderate): Secondary | ICD-10-CM | POA: Insufficient documentation

## 2016-02-28 DIAGNOSIS — I110 Hypertensive heart disease with heart failure: Secondary | ICD-10-CM | POA: Diagnosis not present

## 2016-02-28 DIAGNOSIS — J449 Chronic obstructive pulmonary disease, unspecified: Secondary | ICD-10-CM | POA: Insufficient documentation

## 2016-02-28 DIAGNOSIS — R221 Localized swelling, mass and lump, neck: Secondary | ICD-10-CM | POA: Diagnosis not present

## 2016-02-28 DIAGNOSIS — R52 Pain, unspecified: Secondary | ICD-10-CM | POA: Diagnosis not present

## 2016-02-28 DIAGNOSIS — I509 Heart failure, unspecified: Secondary | ICD-10-CM | POA: Diagnosis not present

## 2016-02-28 DIAGNOSIS — J690 Pneumonitis due to inhalation of food and vomit: Secondary | ICD-10-CM | POA: Diagnosis not present

## 2016-02-28 DIAGNOSIS — I130 Hypertensive heart and chronic kidney disease with heart failure and stage 1 through stage 4 chronic kidney disease, or unspecified chronic kidney disease: Secondary | ICD-10-CM | POA: Diagnosis not present

## 2016-02-28 DIAGNOSIS — M542 Cervicalgia: Secondary | ICD-10-CM | POA: Diagnosis not present

## 2016-02-28 DIAGNOSIS — K311 Adult hypertrophic pyloric stenosis: Secondary | ICD-10-CM | POA: Diagnosis not present

## 2016-02-28 DIAGNOSIS — I481 Persistent atrial fibrillation: Secondary | ICD-10-CM | POA: Diagnosis not present

## 2016-02-28 DIAGNOSIS — R220 Localized swelling, mass and lump, head: Secondary | ICD-10-CM | POA: Diagnosis not present

## 2016-02-28 DIAGNOSIS — R05 Cough: Secondary | ICD-10-CM | POA: Diagnosis not present

## 2016-02-28 DIAGNOSIS — J9600 Acute respiratory failure, unspecified whether with hypoxia or hypercapnia: Secondary | ICD-10-CM | POA: Diagnosis not present

## 2016-02-28 DIAGNOSIS — R519 Headache, unspecified: Secondary | ICD-10-CM

## 2016-02-28 DIAGNOSIS — R609 Edema, unspecified: Secondary | ICD-10-CM

## 2016-02-28 LAB — CBC WITH DIFFERENTIAL/PLATELET
Basophils Absolute: 0 10*3/uL (ref 0.0–0.1)
Basophils Relative: 0 %
Eosinophils Absolute: 0.1 10*3/uL (ref 0.0–0.7)
Eosinophils Relative: 3 %
HCT: 23.8 % — ABNORMAL LOW (ref 36.0–46.0)
Hemoglobin: 8.3 g/dL — ABNORMAL LOW (ref 12.0–15.0)
Lymphocytes Relative: 23 %
Lymphs Abs: 1 10*3/uL (ref 0.7–4.0)
MCH: 29.5 pg (ref 26.0–34.0)
MCHC: 34.9 g/dL (ref 30.0–36.0)
MCV: 84.7 fL (ref 78.0–100.0)
Monocytes Absolute: 0.6 10*3/uL (ref 0.1–1.0)
Monocytes Relative: 14 %
Neutro Abs: 2.6 10*3/uL (ref 1.7–7.7)
Neutrophils Relative %: 60 %
Platelets: 271 10*3/uL (ref 150–400)
RBC: 2.81 MIL/uL — ABNORMAL LOW (ref 3.87–5.11)
RDW: 17.5 % — ABNORMAL HIGH (ref 11.5–15.5)
WBC: 4.3 10*3/uL (ref 4.0–10.5)

## 2016-02-28 LAB — BASIC METABOLIC PANEL
Anion gap: 8 (ref 5–15)
BUN: 35 mg/dL — ABNORMAL HIGH (ref 6–20)
CO2: 23 mmol/L (ref 22–32)
Calcium: 8.9 mg/dL (ref 8.9–10.3)
Chloride: 91 mmol/L — ABNORMAL LOW (ref 101–111)
Creatinine, Ser: 1.2 mg/dL — ABNORMAL HIGH (ref 0.44–1.00)
GFR calc Af Amer: 45 mL/min — ABNORMAL LOW (ref >60)
GFR calc non Af Amer: 39 mL/min — ABNORMAL LOW (ref >60)
Glucose, Bld: 121 mg/dL — ABNORMAL HIGH (ref 65–99)
Potassium: 4.7 mmol/L (ref 3.5–5.1)
Sodium: 122 mmol/L — ABNORMAL LOW (ref 135–145)

## 2016-02-28 LAB — I-STAT CHEM 8, ED
BUN: 31 mg/dL — ABNORMAL HIGH (ref 6–20)
Calcium, Ion: 1.16 mmol/L (ref 1.15–1.40)
Chloride: 90 mmol/L — ABNORMAL LOW (ref 101–111)
Creatinine, Ser: 1.3 mg/dL — ABNORMAL HIGH (ref 0.44–1.00)
Glucose, Bld: 119 mg/dL — ABNORMAL HIGH (ref 65–99)
HCT: 27 % — ABNORMAL LOW (ref 36.0–46.0)
Hemoglobin: 9.2 g/dL — ABNORMAL LOW (ref 12.0–15.0)
Potassium: 4.7 mmol/L (ref 3.5–5.1)
Sodium: 122 mmol/L — ABNORMAL LOW (ref 135–145)
TCO2: 23 mmol/L (ref 0–100)

## 2016-02-28 LAB — PROTIME-INR
INR: 1.4
Prothrombin Time: 17.3 seconds — ABNORMAL HIGH (ref 11.4–15.2)

## 2016-02-28 LAB — SEDIMENTATION RATE: Sed Rate: 36 mm/hr — ABNORMAL HIGH (ref 0–22)

## 2016-02-28 LAB — TROPONIN I: Troponin I: <0.03 ng/mL (ref <0.03)

## 2016-02-28 MED ORDER — IOPAMIDOL (ISOVUE-300) INJECTION 61%
INTRAVENOUS | Status: AC
  Filled 2016-02-28: qty 75

## 2016-02-28 MED ORDER — MORPHINE SULFATE (PF) 2 MG/ML IV SOLN
2.0000 mg | Freq: Once | INTRAVENOUS | Status: AC
  Administered 2016-02-28: 2 mg via INTRAVENOUS
  Filled 2016-02-28: qty 1

## 2016-02-28 MED ORDER — ACETAMINOPHEN 325 MG PO TABS
650.0000 mg | ORAL_TABLET | Freq: Once | ORAL | Status: AC
  Administered 2016-02-28: 650 mg via ORAL
  Filled 2016-02-28: qty 2

## 2016-02-28 MED ORDER — SODIUM CHLORIDE 0.9 % IJ SOLN
INTRAMUSCULAR | Status: AC
  Filled 2016-02-28: qty 50

## 2016-02-28 MED ORDER — SODIUM CHLORIDE 1 G PO TABS: 1.0000 g | ORAL_TABLET | Freq: Two times a day (BID) | ORAL | 0 refills | Status: DC

## 2016-02-28 MED ORDER — ONDANSETRON HCL 4 MG/2ML IJ SOLN
4.0000 mg | Freq: Once | INTRAMUSCULAR | Status: AC
  Administered 2016-02-28: 4 mg via INTRAVENOUS
  Filled 2016-02-28: qty 2

## 2016-02-28 MED ORDER — CEFUROXIME AXETIL 250 MG PO TABS: 250.0000 mg | ORAL_TABLET | Freq: Two times a day (BID) | ORAL | 0 refills | Status: DC

## 2016-02-28 MED ORDER — IOPAMIDOL (ISOVUE-300) INJECTION 61%: 75.0000 mL | Freq: Once | INTRAVENOUS | Status: DC | PRN

## 2016-02-28 MED ORDER — SODIUM CHLORIDE 0.9 % IV BOLUS (SEPSIS): 500.0000 mL | Freq: Once | INTRAVENOUS | Status: DC

## 2016-02-28 MED ORDER — IBUPROFEN 200 MG PO TABS
400.0000 mg | ORAL_TABLET | Freq: Once | ORAL | Status: AC
  Administered 2016-02-28: 400 mg via ORAL
  Filled 2016-02-28: qty 2

## 2016-02-28 NOTE — ED Notes (Signed)
ED Provider at bedside. EDP POLLINA UPDATED PT ON RESULTS AND DISCHARGE. MADE AWARE OF DAUGHTER COMING. PT LIVES WITH DAUGHTER.

## 2016-02-28 NOTE — Telephone Encounter (Signed)
Patient caregiver, [name] notified and agreed. Confirmed appointment.

## 2016-02-28 NOTE — Telephone Encounter (Signed)
Shannon [name], caregiver called and stated that patient was in the ER and was released this morning due to Head Pain. They told her to DECREASE her liquid due to Sodium being 122. Cardiologist had told them to stop the sodium pill. Stated they need directions, not sure what to do. Has an appointment this Friday 03/03/2016 with you. Please Advise.

## 2016-02-28 NOTE — ED Triage Notes (Signed)
Per EMS pt is complaining of left sided jaw pain and swelling. Pt has had pain for several days and pain has increased today. Pt rating pain 10/10. In and out of A fib per EMS. 150/90 BP.  Pt has feeding tube in place.

## 2016-02-28 NOTE — ED Notes (Addendum)
PT IS DISCHARGE. LIVES WITH DAUGHTER. AWAITING DAUGHTER TO ARRIVE TO DISCUSS DISCHARGE INSTRUCTIONS. EDP POLLINA AND CHARGE ALAINA RN  AWARE.

## 2016-02-28 NOTE — ED Provider Notes (Addendum)
[facility] DEPT Provider Note   CSN: [id number] Arrival date & time: 02/28/16  0408     History   Chief Complaint Chief Complaint  Patient presents with  . Jaw Pain    HPI Shannon [name] is a 82 y.o. female.  Patient presents to the emergency department with complaints of jaw pain. Pain is constant on the left side of her mouth, present for several days. She has not identified any alleviating or exacerbating factors.      Past Medical History:  Diagnosis Date  . Anemia    previous blood transfusions  . Arthritis    "all over"  . Asthma   . Bradycardia    requiring previous d/c of BB and reduction of amiodarone  . CAD (coronary artery disease)    nonobstructive per notes  . Chronic diastolic CHF (congestive heart failure) ([place])   . CKD (chronic kidney disease), stage III   . Complication of blood transfusion    "got the wrong blood type at [facility] in ~ 2015; no adverse reaction that we are aware of"/daughter, [name] (01/27/2016)  . COPD (chronic obstructive pulmonary disease) ([place])   . Depression    "light case"  . DVT (deep venous thrombosis) ([place]) 01/2016   a. LLE DVT 01/2016 - switched from Eliquis to Coumadin.  . Gastric stenosis    a. s/p stomach tube  . GERD (gastroesophageal reflux disease)   . History of blood transfusion    "several" (01/27/2016)  . History of stomach ulcers   . Hyperlipidemia   . Hypertension   . Hypothyroidism   . Paraesophageal hernia   . Perforated gastric ulcer ([place])   . Seasonal allergies   . SIADH (syndrome of inappropriate ADH production) ([place])    [name] 01/10/2015  . Small bowel obstruction    "I don't know how many" (01/11/2015)  . Stroke ([place])    "light one"  . Type II diabetes mellitus ([place])    "related to prednisone use  for > 20 yr; once predinose stopped; no more DM RX" (01/27/2016)  . Ventral hernia with bowel obstruction     Patient Active Problem List   Diagnosis Date Noted  . Encounter for therapeutic  drug monitoring 02/16/2016  . Sepsis ([place]) 02/08/2016  . UTI (urinary tract infection) 02/08/2016  . Warfarin anticoagulation   . Chronic seasonal allergic rhinitis   . Sinus pain   . Acute renal insufficiency 01/26/2016  . Acute deep vein thrombosis (DVT) of left lower extremity ([place])   . Acute respiratory failure with hypoxia ([place])   . Respiratory alkalosis   . Atrial flutter ([place]) 01/24/2016  . Chronic diastolic CHF (congestive heart failure) ([place]) 01/24/2016  . Infection 01/24/2016  . Bilateral leg edema 12/29/2015  . Hypothyroidism due to medication   . Weakness   . Pyloric stenosis   . Pressure ulcer 09/15/2015  . Hyperkalemia 09/12/2015  . Acute osteomyelitis of toe of right foot ([place])   . PVD (peripheral vascular disease) ([place])   . Adjustment disorder with anxiety 08/29/2015  . Diabetic ulcer of toe of right foot associated with type 2 diabetes mellitus, with necrosis of muscle ([place])   . Diverticulitis of large intestine without perforation or abscess without bleeding   . Colitis 08/26/2015  . Acute on chronic diastolic heart failure ([place]) 08/02/2015  . Esophageal candidiasis ([place])   . Dyspnea   . SOB (shortness of breath)   . Acute renal failure superimposed on stage 3 chronic kidney disease ([place])   .  Encounter for long-term (current) use of other high-risk medications   . Respiratory distress 07/06/2015  . Dysphagia 07/05/2015  . Asthma with allergic rhinitis 06/04/2015  . Hypertensive heart disease 03/18/2015  . Malnutrition of moderate degree 01/28/2015  . Syncope 01/27/2015  . Fever, unspecified 01/22/2015  . Bradycardia 01/21/2015  . Hypotension 01/21/2015  . Palliative care encounter   . DNR (do not resuscitate) discussion   . Protein-calorie malnutrition, severe 01/12/2015  . Elevated troponin 01/12/2015  . HTN (hypertension), uncontrolled 01/12/2015  . AP (abdominal pain) 01/10/2015  . SBO (small bowel obstruction) 01/10/2015  . Cerebrovascular disease  07/14/2014  . Diabetic polyneuropathy associated with type 2 diabetes mellitus ([place]) 07/14/2014  . Hyperlipidemia 07/14/2014  . Gastric outlet obstruction 05/21/2014  . Unstable gait 04/29/2014  . Hyponatremia 03/23/2014  . Acute encephalopathy 03/22/2014  . Slurred speech   . Gastroparesis   . Slow transit constipation 02/03/2014  . Carotid stenosis 02/03/2014  . Demand ischemia ([place]) 01/21/2014  . Gastric bezoar 01/20/2014  . Anemia of chronic disease 01/18/2014  . CKD (chronic kidney disease) stage 3, GFR 30-59 ml/min 01/18/2014  . Depression 09/29/2013  . Essential hypertension, benign 09/22/2013  . GERD (gastroesophageal reflux disease) 09/22/2013  . Dyslipidemia 09/22/2013  . DM (diabetes mellitus) type II uncontrolled, periph vascular disorder ([place]) 09/22/2013  . A-fib ([place]) 09/22/2013  . CVA (cerebral vascular accident) ([place]) 09/22/2013  . Diabetic neuropathy ([place]) 09/22/2013  . Hypothyroidism 09/22/2013  . Chronic gastrojejunal ulcer with perforation ([place]) 09/07/2013  . Diabetes ([place]) 09/07/2013  . Accelerated hypertension 09/07/2013  . Systemic lupus erythematosus (SLE) inhibitor 09/07/2013  . Other specified abnormal immunological findings in serum 09/07/2013    Past Surgical History:  Procedure Laterality Date  . CATARACT EXTRACTION W/ INTRAOCULAR LENS  IMPLANT, BILATERAL    . CHOLECYSTECTOMY OPEN    . COLECTOMY    . ESOPHAGOGASTRODUODENOSCOPY N/A 01/19/2014   Procedure: ESOPHAGOGASTRODUODENOSCOPY (EGD);  Surgeon: Irene Shipper, MD;  Location: [facility];  Service: Endoscopy;  Laterality: N/A;  . ESOPHAGOGASTRODUODENOSCOPY N/A 01/20/2014   Procedure: ESOPHAGOGASTRODUODENOSCOPY (EGD);  Surgeon: Irene Shipper, MD;  Location: [facility];  Service: Endoscopy;  Laterality: N/A;  . ESOPHAGOGASTRODUODENOSCOPY N/A 03/19/2014   Procedure: ESOPHAGOGASTRODUODENOSCOPY (EGD);  Surgeon: Milus Banister, MD;  Location: [facility];  Service: Endoscopy;  Laterality: N/A;  .  ESOPHAGOGASTRODUODENOSCOPY N/A 07/08/2015   Procedure: ESOPHAGOGASTRODUODENOSCOPY (EGD);  Surgeon: Doran Stabler, MD;  Location[facility] ENDOSCOPY;  Service: Endoscopy;  Laterality: N/A;  . ESOPHAGOGASTRODUODENOSCOPY (EGD) WITH PROPOFOL N/A 09/15/2015   Procedure: ESOPHAGOGASTRODUODENOSCOPY (EGD) WITH PROPOFOL;  Surgeon: Manus Gunning, MD;  Location: [facility] ENDOSCOPY;  Service: Gastroenterology;  Laterality: N/A;  . GASTROJEJUNOSTOMY     hx/notes 01/10/2015  . GASTROJEJUNOSTOMY N/A 09/23/2015   Procedure: OPEN GASTROJEJUNOSTOMY TUBE PLACEMENT;  Surgeon: Arta Bruce Kinsinger, MD;  Location: [facility] ORS;  Service: General;  Laterality: N/A;  . GLAUCOMA SURGERY Bilateral   . HERNIA REPAIR  2015  . IR GENERIC HISTORICAL  01/07/2016   IR GJ TUBE CHANGE 01/07/2016 Jacqulynn Cadet, MD [facility]INTER[facility]  . IR GENERIC HISTORICAL  01/27/2016   IR MECH REMOV OBSTRUC MAT ANY COLON TUBE W/FLUORO 01/27/2016 Markus Daft, MD [facility]  . IR GENERIC HISTORICAL  02/07/2016   IR PATIENT EVAL TECH 0-60 MINS Darrell K Allred, PA-C [facility]  . IR GENERIC HISTORICAL  02/08/2016   IR GJ TUBE CHANGE 02/08/2016 Greggory Keen, MD [facility]  . LAPAROTOMY N/A 01/20/2015   Procedure: EXPLORATORY LAPAROTOMY;  Surgeon:  Coralie Keens, MD;  Location: [facility];  Service: General;  Laterality: N/A;  . LYSIS OF ADHESION N/A 01/20/2015   Procedure: LYSIS OF ADHESIONS < 1 HOUR;  Surgeon: Coralie Keens, MD;  Location: [facility];  Service: General;  Laterality: N/A;  . TONSILLECTOMY    . TUBAL LIGATION    . VENTRAL HERNIA REPAIR  2015   incarcerated ventral hernia ([facility] 09/2013)/notes 01/10/2015    OB History    No data available       Home Medications    Prior to Admission medications   Medication Sig Start Date End Date Taking? Authorizing Provider  acetaminophen (TYLENOL) 325 MG tablet Take 650 mg by mouth 2 (two) times daily as needed for mild pain.     Historical Provider, MD  ADVAIR DISKUS 250-50 MCG/DOSE AEPB INHALE 1  PUFF INTO THE LUNGS 2 (TWO) TIMES DAILY AS NEEDED FOR SHORTNESS OF BREATH 12/01/14   Historical Provider, MD  AMINO ACIDS-PROTEIN HYDROLYS PO Take 30 mLs by mouth 2 (two) times daily.    Historical Provider, MD  amiodarone (PACERONE) 200 MG tablet Take 200 mg by mouth daily.    Historical Provider, MD  amLODipine (NORVASC) 10 MG tablet TAKE 1 TABLET BY MOUTH DAILY FOR HYPERTENSION 02/21/16   Gildardo Cranker, DO  atorvastatin (LIPITOR) 10 MG tablet Take 10 mg by mouth daily.    Historical Provider, MD  b complex vitamins tablet Take 1 tablet by mouth daily.     Historical Provider, MD  bisacodyl (DULCOLAX) 10 MG suppository Place 1 suppository (10 mg total) rectally daily as needed for moderate constipation. 09/30/15   Debbe Odea, MD  calcium-vitamin D (OSCAL WITH D) 500-200 MG-UNIT per tablet Take 1 tablet by mouth daily with breakfast.     Historical Provider, MD  cetirizine (ZYRTEC) 10 MG tablet Take 10 mg by mouth daily as needed for allergies or rhinitis.    Historical Provider, MD  chlorhexidine (PERIDEX) 0.12 % solution Use as directed 15 mLs in the mouth or throat 2 (two) times daily.    Historical Provider, MD  diclofenac sodium (VOLTAREN) 1 % GEL Apply 2 g topically daily as needed (for pain).     Historical Provider, MD  diltiazem (CARDIZEM CD) 120 MG 24 hr capsule Take 1 capsule (120 mg total) by mouth daily. 02/23/16 05/23/16  Thompson Grayer, MD  feeding supplement (BOOST / RESOURCE BREEZE) LIQD Take 1 Container by mouth daily. 11/11/15   Eugenie Filler, MD  fluticasone (FLONASE) 50 MCG/ACT nasal spray Place 2 sprays into both nostrils daily. 08/07/14   Gildardo Cranker, DO  gabapentin (NEURONTIN) 300 MG capsule Take 1 capsule (300 mg total) by mouth 3 (three) times daily. 02/02/16   Arbutus Leas, NP  hydrALAZINE (APRESOLINE) 50 MG tablet Take 1 tablet (50 mg total) by mouth 3 (three) times daily. 11/11/15   Eugenie Filler, MD  HYDROcodone-acetaminophen (NORCO) 5-325 MG tablet Take 1 tablet by  mouth every 6 (six) hours as needed for moderate pain. 02/18/16   Monica Carter, DO  ipratropium-albuterol (DUONEB) 0.5-2.5 (3) MG/3ML SOLN Take 3 mLs by nebulization every 6 (six) hours as needed (sob). Use 3 times daily x 4 days then every 6 hours as needed. 11/11/15   Eugenie Filler, MD  isosorbide mononitrate (IMDUR) 30 MG 24 hr tablet TAKE 1 TABLET (30 MG TOTAL) BY MOUTH DAILY. 06/22/15   Historical Provider, MD  latanoprost (XALATAN) 0.005 % ophthalmic solution Place 1 drop into both eyes at bedtime.  06/02/14   Blanchie Serve, MD  levalbuterol (XOPENEX HFA) 45 MCG/ACT inhaler Inhale 2 puffs into the lungs every 6 (six) hours as needed for wheezing or shortness of breath. 02/03/16   Gildardo Cranker, DO  levothyroxine (SYNTHROID, LEVOTHROID) 137 MCG tablet Take 1 tablet (137 mcg total) by mouth daily before breakfast. 12/27/15   Gildardo Cranker, DO  Maltodextrin-Xanthan Gum (RESOURCE THICKENUP CLEAR) POWD Take 120 g by mouth as needed (use to thicken liquids.). 11/11/15   Eugenie Filler, MD  Multiple Vitamin (MULTIVITAMIN WITH MINERALS) TABS tablet Take 1 tablet by mouth daily.    Historical Provider, MD  Nutritional Supplements (FEEDING SUPPLEMENT, OSMOLITE 1.5 CAL,) LIQD Place 1,000 mLs into feeding tube daily. 09/30/15   Debbe Odea, MD  ondansetron (ZOFRAN) 4 MG tablet Take 1 tablet (4 mg total) by mouth every 8 (eight) hours as needed for nausea or vomiting. 09/07/15   [name], NP  pantoprazole (PROTONIX) 40 MG tablet TAKE 1 TABLET BY MOUTH TWICE A DAY 08/11/15   Gildardo Cranker, DO  Polyethyl Glycol-Propyl Glycol 0.4-0.3 % SOLN Place 1 drop into both eyes 4 (four) times daily.     Historical Provider, MD  polyethylene glycol powder (GLYCOLAX/MIRALAX) powder MIX 17GRAMS IN 8 OUNCES OF LIQUID AND DRINK TWICE DAILY AS NEEDED 09/13/15   Gildardo Cranker, DO  RESTASIS 0.05 % ophthalmic emulsion USE 1 DROP INTO BOTH EYES TWICE DAILY 11/01/15   Estill Dooms, MD  sertraline (ZOLOFT) 100 MG tablet Take  one tablet by mouth once daily for depression 09/03/15   Gildardo Cranker, DO  simethicone (MYLICON) 80 MG chewable tablet Chew 80 mg by mouth 4 (four) times daily as needed for flatulence.    Historical Provider, MD  traMADol (ULTRAM) 50 MG tablet Take 50 mg by mouth daily as needed for moderate pain.     Historical Provider, MD  warfarin (COUMADIN) 2.5 MG tablet Take as directed by [facility]. 02/16/16   Dorothy Spark, MD  Water For Irrigation, Sterile (FREE WATER) SOLN Place 230 mLs into feeding tube 3 (three) times daily.    Historical Provider, MD    Family History Family History  Problem Relation Age of Onset  . Stroke Mother   . Hypertension Mother   . Diabetes Brother   . Heart attack Neg Hx     Social History Social History  Substance Use Topics  . Smoking status: Never Smoker  . Smokeless tobacco: Former Systems developer    Types: Snuff     Comment: "used snuff in my younger days"  . Alcohol use No     Allergies   Penicillins   Review of Systems Review of Systems  HENT: Positive for dental problem.   All other systems reviewed and are negative.    Physical Exam Updated Vital Signs BP 113/77   Pulse 64   Temp 97.8 F (36.6 C) (Oral)   Resp 15   Ht 5\' 6"  (1.676 m)   Wt 147 lb (66.7 kg)   SpO2 99%   BMI 23.73 kg/m   Physical Exam  Constitutional: She is oriented to person, place, and time. She appears well-developed and well-nourished. No distress.  HENT:  Head: Normocephalic and atraumatic.  Right Ear: Hearing normal.  Left Ear: Hearing normal.  Nose: Nose normal.  Mouth/Throat: Oropharynx is clear and moist and mucous membranes are normal.  Tenderness to the single existing tooth on the left upper side of her mouth without abscess formation  Eyes: Conjunctivae and EOM  are normal. Pupils are equal, round, and reactive to light.  Neck: Normal range of motion. Neck supple.  Cardiovascular: Regular rhythm, S1 normal and S2 normal.  Exam reveals no gallop and  no friction rub.   No murmur heard. Pulmonary/Chest: Effort normal and breath sounds normal. No respiratory distress. She exhibits no tenderness.  Abdominal: Soft. Normal appearance and bowel sounds are normal. There is no hepatosplenomegaly. There is no tenderness. There is no rebound, no guarding, no tenderness at McBurney's point and negative Murphy's sign. No hernia.  Musculoskeletal: Normal range of motion.  Neurological: She is alert and oriented to person, place, and time. She has normal strength. No cranial nerve deficit or sensory deficit. Coordination normal. GCS eye subscore is 4. GCS verbal subscore is 5. GCS motor subscore is 6.  Skin: Skin is warm, dry and intact. No rash noted. No cyanosis.  Psychiatric: She has a normal mood and affect. Her speech is normal and behavior is normal. Thought content normal.  Nursing note and vitals reviewed.    ED Treatments / Results  Labs (all labs ordered are listed, but only abnormal results are displayed) Labs Reviewed  CBC WITH DIFFERENTIAL/PLATELET - Abnormal; Notable for the following:       Result Value   RBC 2.81 (*)    Hemoglobin 8.3 (*)    HCT 23.8 (*)    RDW 17.5 (*)    All other components within normal limits  BASIC METABOLIC PANEL - Abnormal; Notable for the following:    Sodium 122 (*)    Chloride 91 (*)    Glucose, Bld 121 (*)    BUN 35 (*)    Creatinine, Ser 1.20 (*)    GFR calc non Af Amer 39 (*)    GFR calc Af Amer 45 (*)    All other components within normal limits  SEDIMENTATION RATE - Abnormal; Notable for the following:    Sed Rate 36 (*)    All other components within normal limits  I-STAT CHEM 8, ED - Abnormal; Notable for the following:    Sodium 122 (*)    Chloride 90 (*)    BUN 31 (*)    Creatinine, Ser 1.30 (*)    Glucose, Bld 119 (*)    Hemoglobin 9.2 (*)    HCT 27.0 (*)    All other components within normal limits  TROPONIN I  PROTIME-INR    EKG  EKG Interpretation  Date/Time:  Monday  February 28 2016 05:39:12 EST Ventricular Rate:  71 PR Interval:    QRS Duration: 74 QT Interval:  437 QTC Calculation: 444 R Axis:   88 Text Interpretation:  Age not entered, assumed to be  82 years old for purpose of ECG interpretation Atrial flutter Ventricular bigeminy Borderline low voltage, extremity leads Probable anteroseptal infarct, old Borderline repolarization abnormality Confirmed by Nia Nathaniel  MD, Teniyah Seivert [id number]) on 02/28/2016 7:13:06 AM       Radiology Ct Head Wo Contrast  Result Date: 02/28/2016 CLINICAL DATA:  Jaw pain.  Pain and swelling. EXAM: CT HEAD WITHOUT CONTRAST CT NECK WITHOUT CONTRAST TECHNIQUE: Contiguous axial images were obtained from the base of the skull through the vertex without contrast. Multidetector CT imaging of the neck was performed using the standard protocol without intravenous contrast. COMPARISON:  Head CT 11/06/2015, sinus CT 01/27/2016 FINDINGS: CT HEAD FINDINGS Brain: No acute intracranial hemorrhage. No focal mass lesion. No CT evidence of acute infarction. No midline shift or mass effect. No hydrocephalus. Basilar cisterns  are patent. There are periventricular and subcortical white matter hypodensities. Generalized cortical atrophy. Vascular: No hyperdense vessel or unexpected calcification. Skull: Normal. Negative for fracture or focal lesion. Sinuses/Orbits: No acute finding. Other: None. CT NECK FINDINGS Pharynx and larynx: Normal. No mass or swelling. Salivary glands: No inflammation, mass, or stone. Thyroid: Normal. Lymph nodes: None enlarged or abnormal density. Vascular: Negative. Mastoids and visualized paranasal sinuses: Clear. Skeleton: Motion degradation limits evaluation of the available. Degenerate change in the cervical spine Set hypertrophy, osteophytosis and joint space narrowing. Degenerative cystic change in the dense Upper chest: Clear Other: Motion degradation IMPRESSION: 1. No acute intracranial findings. 2. Atrophy and white  matter microvascular disease. 3. No acute findings in the neck. 4. Motion degradation limits evaluation of the mandible. No abscess or inflammation identified. Electronically Signed   By: Suzy Bouchard M.D.   On: 02/28/2016 08:04   Ct Soft Tissue Neck Wo Contrast  Result Date: 02/28/2016 CLINICAL DATA:  Jaw pain.  Pain and swelling. EXAM: CT HEAD WITHOUT CONTRAST CT NECK WITHOUT CONTRAST TECHNIQUE: Contiguous axial images were obtained from the base of the skull through the vertex without contrast. Multidetector CT imaging of the neck was performed using the standard protocol without intravenous contrast. COMPARISON:  Head CT 11/06/2015, sinus CT 01/27/2016 FINDINGS: CT HEAD FINDINGS Brain: No acute intracranial hemorrhage. No focal mass lesion. No CT evidence of acute infarction. No midline shift or mass effect. No hydrocephalus. Basilar cisterns are patent. There are periventricular and subcortical white matter hypodensities. Generalized cortical atrophy. Vascular: No hyperdense vessel or unexpected calcification. Skull: Normal. Negative for fracture or focal lesion. Sinuses/Orbits: No acute finding. Other: None. CT NECK FINDINGS Pharynx and larynx: Normal. No mass or swelling. Salivary glands: No inflammation, mass, or stone. Thyroid: Normal. Lymph nodes: None enlarged or abnormal density. Vascular: Negative. Mastoids and visualized paranasal sinuses: Clear. Skeleton: Motion degradation limits evaluation of the available. Degenerate change in the cervical spine Set hypertrophy, osteophytosis and joint space narrowing. Degenerative cystic change in the dense Upper chest: Clear Other: Motion degradation IMPRESSION: 1. No acute intracranial findings. 2. Atrophy and white matter microvascular disease. 3. No acute findings in the neck. 4. Motion degradation limits evaluation of the mandible. No abscess or inflammation identified. Electronically Signed   By: Suzy Bouchard M.D.   On: 02/28/2016 08:04     Procedures Procedures (including critical care time)  Medications Ordered in ED Medications  iopamidol (ISOVUE-300) 61 % injection 75 mL (not administered)  sodium chloride 0.9 % injection (not administered)  iopamidol (ISOVUE-300) 61 % injection (not administered)  morphine 2 MG/ML injection 2 mg (2 mg Intravenous Given 02/28/16 0638)  ondansetron (ZOFRAN) injection 4 mg (4 mg Intravenous Given 02/28/16 7846)     Initial Impression / Assessment and Plan / ED Course  I have reviewed the triage vital signs and the nursing notes.  Pertinent labs & imaging results that were available during my care of the patient were reviewed by me and considered in my medical decision making (see chart for details).  Clinical Course    Patient presents to the emergency department for evaluation of headache, jaw pain, neck pain. She reports swelling on the left side of her jaw. Symptoms have been ongoing for several days. She has not had any chest pain or shortness of breath. She reports the pain worsens when she opens and closes her mouth.  Examination reveals diffuse tenderness on the left side of her jaw, submandibular area, postauricular area.  Patient not experiencing  any chest pain. Area is tender to the touch, doubt this is referred pain from cardiac etiology. Troponin negative. EKG does not show evidence of ischemia. She is in atrial flutter at a heart rate of 71. This is a chronic finding for her. CHADS2-VASc score is 9. She is already anticoagulated on Coumadin.  Patient sedimentation rate is 36. With her advanced age, this is not significantly elevated. She has not expressed any visual changes, jaw claudication. Presentation is not consistent with temporal arteritis.  CT head and CT nec for further evaluation of her headache and neck and jaw pain and swelling, both negative.Patient does have swelling and tenderness around the left upper tooth that is present. She is convinced that this is  the cause of her pain. We'll empirically cover with antibiotic.  Patient does have hyponatremia. She has had hyponatremia in the past, but sodium is lower than it has been. She does carry a diagnosis of SIADH. Findings discussed with Dr. [name] Beavers, on-call for hospitalist. He felt the patient could be treated with fluid restriction to 1500 mL per day and salt tablets twice a day for 3 days. Follow up with PCP.  Final Clinical Impressions(s) / ED Diagnoses   Final diagnoses:  Acute nonintractable headache, unspecified headache type  Hyponatremia    New Prescriptions New Prescriptions   No medications on file     Orpah Greek, MD 02/28/16 [id number]    Orpah Greek, MD 02/28/16 0830    Orpah Greek, MD 03/09/16 6812

## 2016-02-28 NOTE — Telephone Encounter (Signed)
Order placed

## 2016-02-28 NOTE — Telephone Encounter (Signed)
According to d/c summary, she is to have 1578ml per day fluid restriction AND she is to take sodium tabs BID x 3 days. F/u in office as scheduled

## 2016-02-28 NOTE — ED Notes (Signed)
Bed: HQ60 Expected date:  Expected time:  Means of arrival:  Comments: EMS 82yo F jaw pain / abscess

## 2016-02-29 ENCOUNTER — Telehealth: Payer: Self-pay | Admitting: Cardiology

## 2016-02-29 ENCOUNTER — Ambulatory Visit (INDEPENDENT_AMBULATORY_CARE_PROVIDER_SITE_OTHER): Payer: Medicare Other

## 2016-02-29 ENCOUNTER — Encounter (INDEPENDENT_AMBULATORY_CARE_PROVIDER_SITE_OTHER): Payer: Self-pay | Admitting: Orthopaedic Surgery

## 2016-02-29 ENCOUNTER — Ambulatory Visit (INDEPENDENT_AMBULATORY_CARE_PROVIDER_SITE_OTHER): Payer: Medicare Other | Admitting: Orthopaedic Surgery

## 2016-02-29 VITALS — BP 113/77 | HR 64 | Ht 66.0 in | Wt 147.0 lb

## 2016-02-29 DIAGNOSIS — I6523 Occlusion and stenosis of bilateral carotid arteries: Secondary | ICD-10-CM

## 2016-02-29 DIAGNOSIS — M47812 Spondylosis without myelopathy or radiculopathy, cervical region: Secondary | ICD-10-CM | POA: Diagnosis not present

## 2016-02-29 DIAGNOSIS — I4892 Unspecified atrial flutter: Secondary | ICD-10-CM

## 2016-02-29 DIAGNOSIS — Z5181 Encounter for therapeutic drug level monitoring: Secondary | ICD-10-CM

## 2016-02-29 NOTE — Telephone Encounter (Signed)
Returned call to pt's daughter, pt was in ED yesterday had INR checked. See anticoagulation note in Epic.

## 2016-02-29 NOTE — Telephone Encounter (Signed)
New MEssage  Pt daughter call requesting to speak with RN about pt results. Please call back to discuss

## 2016-02-29 NOTE — Progress Notes (Signed)
Office Visit Note   Patient: Shannon [name]           Date of Birth: 19-Nov-1923           MRN: [id number] Visit Date: 02/29/2016              Requested by: Kirt Boys, DO [address] [place], [place] [address] PCP: Kirt Boys, DO   Assessment & Plan: Visit Diagnoses:  1. Spondylosis of cervical region without myelopathy or radiculopathy     Plan: She can continue Aspercreme use the collar intermittently as the needed during the day to help with the her neck pain. I reviewed the CT with patient and her daughter at this point do not think she needs a diagnostic MRI scan. Patient has cervical spondylosis and hopefully with some Aspercreme and intermittent use of the collar she can get some improvement in her symptoms. I cautioned her about narcotic usage particularly of her age and fall risks  Follow-Up Instructions: Return if symptoms worsen or fail to improve.   Orders:  No orders of the defined types were placed in this encounter.  No orders of the defined types were placed in this encounter.     Procedures: No procedures performed   Clinical Data: No additional findings.   Subjective: Chief Complaint  Patient presents with  . Neck - Pain    Patient was referred to [place] by Dr. Kirt Boys with [facility] for neck pain. She was seen in the ER yesterday for headache, neck and jaw pain and had CT Head and CT Soft Tissue Neck performed. Patient states that she still has headache and pain left side of neck. She does have pain that sometimes radiates into left shoulder. She states there is tingling in both hands, "feels like they are going to sleep".  Patient was taking Tramadol and is now using Hydrocodone. She also uses Tylenol as needed. She does have cervical spine xrays from 01/03/2016 on [facility].  Patient has trouble when she lays down to sleep some shortness of breath related to her heart failure. Problems in the past with the SIADH post CVA with  sodium yesterday 122. She's currently on fluid restriction. She has pain in her presumed muscles pain with rotation of her neck. CT scan head and neck showed no evidence of CVA and she does have spondylosis in her neck and some disc bulge but on CT scan I did not see any areas of severe stenosis. Disc bulge at the C4-5 and calcified disc protrusion at C3-4 noted.  We will fit her for soft cervical collar she continues intermittently. She can continue to use some Aspercreme on her neck.  Review of Systems reviewed with the patient's daughter is with her today as well as chart review and review of systems with the patient unchanged from on notations in the chart from ER visit yesterday   Objective: Vital Signs: BP 113/77   Pulse 64   Ht 5\' 6"  (1.676 m)   Wt 147 lb (66.7 kg)   BMI 23.73 kg/m   Physical Exam patient's immaturity rolling walker. She has some partial athetoid motion of her cervical spine. Tenderness of the trapezial muscles. No brachial plexus tenderness. Mild JVD noted. Prolonged inspiration with use of accessory muscles. Approximately reflexes 1+ and symmetrical.  Ortho Exam patient has mild interossei weakness. Wrist flexion extension grip is good for her age.  Specialty Comments:  No specialty comments available.  Imaging: No results found.  PMFS History: Patient Active Problem List   Diagnosis Date Noted  . Encounter for therapeutic drug monitoring 02/16/2016  . Sepsis (HCC) 02/08/2016  . UTI (urinary tract infection) 02/08/2016  . Warfarin anticoagulation   . Chronic seasonal allergic rhinitis   . Sinus pain   . Acute renal insufficiency 01/26/2016  . Acute deep vein thrombosis (DVT) of left lower extremity (HCC)   . Acute respiratory failure with hypoxia (HCC)   . Respiratory alkalosis   . Atrial flutter (HCC) 01/24/2016  . Chronic diastolic CHF (congestive heart failure) (HCC) 01/24/2016  . Infection 01/24/2016  . Bilateral leg edema 12/29/2015  .  Hypothyroidism due to medication   . Weakness   . Pyloric stenosis   . Pressure ulcer 09/15/2015  . Hyperkalemia 09/12/2015  . Acute osteomyelitis of toe of right foot (HCC)   . PVD (peripheral vascular disease) (HCC)   . Adjustment disorder with anxiety 08/29/2015  . Diabetic ulcer of toe of right foot associated with type 2 diabetes mellitus, with necrosis of muscle (HCC)   . Diverticulitis of large intestine without perforation or abscess without bleeding   . Colitis 08/26/2015  . Acute on chronic diastolic heart failure (HCC) 08/02/2015  . Esophageal candidiasis (HCC)   . Dyspnea   . SOB (shortness of breath)   . Acute renal failure superimposed on stage 3 chronic kidney disease (HCC)   . Encounter for long-term (current) use of other high-risk medications   . Respiratory distress 07/06/2015  . Dysphagia 07/05/2015  . Asthma with allergic rhinitis 06/04/2015  . Hypertensive heart disease 03/18/2015  . Malnutrition of moderate degree 01/28/2015  . Syncope 01/27/2015  . Fever, unspecified 01/22/2015  . Bradycardia 01/21/2015  . Hypotension 01/21/2015  . Palliative care encounter   . DNR (do not resuscitate) discussion   . Protein-calorie malnutrition, severe 01/12/2015  . Elevated troponin 01/12/2015  . HTN (hypertension), uncontrolled 01/12/2015  . AP (abdominal pain) 01/10/2015  . SBO (small bowel obstruction) 01/10/2015  . Cerebrovascular disease 07/14/2014  . Diabetic polyneuropathy associated with type 2 diabetes mellitus (HCC) 07/14/2014  . Hyperlipidemia 07/14/2014  . Gastric outlet obstruction 05/21/2014  . Unstable gait 04/29/2014  . Hyponatremia 03/23/2014  . Acute encephalopathy 03/22/2014  . Slurred speech   . Gastroparesis   . Slow transit constipation 02/03/2014  . Carotid stenosis 02/03/2014  . Demand ischemia (HCC) 01/21/2014  . Gastric bezoar 01/20/2014  . Anemia of chronic disease 01/18/2014  . CKD (chronic kidney disease) stage 3, GFR 30-59 ml/min  01/18/2014  . Depression 09/29/2013  . Essential hypertension, benign 09/22/2013  . GERD (gastroesophageal reflux disease) 09/22/2013  . Dyslipidemia 09/22/2013  . DM (diabetes mellitus) type II uncontrolled, periph vascular disorder (HCC) 09/22/2013  . A-fib (HCC) 09/22/2013  . CVA (cerebral vascular accident) (HCC) 09/22/2013  . Diabetic neuropathy (HCC) 09/22/2013  . Hypothyroidism 09/22/2013  . Chronic gastrojejunal ulcer with perforation (HCC) 09/07/2013  . Diabetes (HCC) 09/07/2013  . Accelerated hypertension 09/07/2013  . Systemic lupus erythematosus (SLE) inhibitor 09/07/2013  . Other specified abnormal immunological findings in serum 09/07/2013   Past Medical History:  Diagnosis Date  . Anemia    previous blood transfusions  . Arthritis    "all over"  . Asthma   . Bradycardia    requiring previous d/c of BB and reduction of amiodarone  . CAD (coronary artery disease)    nonobstructive per notes  . Chronic diastolic CHF (congestive heart failure) (HCC)   . CKD (chronic kidney disease), stage III   .  Complication of blood transfusion    "got the wrong blood type at [facility] in ~ 2015; no adverse reaction that we are aware of"/daughter, [name] (01/27/2016)  . COPD (chronic obstructive pulmonary disease) (HCC)   . Depression    "light case"  . DVT (deep venous thrombosis) (HCC) 01/2016   a. LLE DVT 01/2016 - switched from Eliquis to Coumadin.  . Gastric stenosis    a. s/p stomach tube  . GERD (gastroesophageal reflux disease)   . History of blood transfusion    "several" (01/27/2016)  . History of stomach ulcers   . Hyperlipidemia   . Hypertension   . Hypothyroidism   . Paraesophageal hernia   . Perforated gastric ulcer (HCC)   . Seasonal allergies   . SIADH (syndrome of inappropriate ADH production) (HCC)    [name] 01/10/2015  . Small bowel obstruction    "I don't know how many" (01/11/2015)  . Stroke (HCC)    "light one"  . Type II diabetes mellitus (HCC)     "related to prednisone use  for > 20 yr; once predinose stopped; no more DM RX" (01/27/2016)  . Ventral hernia with bowel obstruction     Family History  Problem Relation Age of Onset  . Stroke Mother   . Hypertension Mother   . Diabetes Brother   . Heart attack Neg Hx     Past Surgical History:  Procedure Laterality Date  . CATARACT EXTRACTION W/ INTRAOCULAR LENS  IMPLANT, BILATERAL    . CHOLECYSTECTOMY OPEN    . COLECTOMY    . ESOPHAGOGASTRODUODENOSCOPY N/A 01/19/2014   Procedure: ESOPHAGOGASTRODUODENOSCOPY (EGD);  Surgeon: Hilarie Fredrickson, MD;  Location: [facility];  Service: Endoscopy;  Laterality: N/A;  . ESOPHAGOGASTRODUODENOSCOPY N/A 01/20/2014   Procedure: ESOPHAGOGASTRODUODENOSCOPY (EGD);  Surgeon: Hilarie Fredrickson, MD;  Location: [facility];  Service: Endoscopy;  Laterality: N/A;  . ESOPHAGOGASTRODUODENOSCOPY N/A 03/19/2014   Procedure: ESOPHAGOGASTRODUODENOSCOPY (EGD);  Surgeon: Rachael Fee, MD;  Location: [facility];  Service: Endoscopy;  Laterality: N/A;  . ESOPHAGOGASTRODUODENOSCOPY N/A 07/08/2015   Procedure: ESOPHAGOGASTRODUODENOSCOPY (EGD);  Surgeon: Sherrilyn Rist, MD;  Location: [facility] ENDOSCOPY;  Service: Endoscopy;  Laterality: N/A;  . ESOPHAGOGASTRODUODENOSCOPY (EGD) WITH PROPOFOL N/A 09/15/2015   Procedure: ESOPHAGOGASTRODUODENOSCOPY (EGD) WITH PROPOFOL;  Surgeon: Ruffin Frederick, MD;  Location: [facility] ENDOSCOPY;  Service: Gastroenterology;  Laterality: N/A;  . GASTROJEJUNOSTOMY     hx/notes 01/10/2015  . GASTROJEJUNOSTOMY N/A 09/23/2015   Procedure: OPEN GASTROJEJUNOSTOMY TUBE PLACEMENT;  Surgeon: De Blanch Kinsinger, MD;  Location: [facility] ORS;  Service: General;  Laterality: N/A;  . GLAUCOMA SURGERY Bilateral   . HERNIA REPAIR  2015  . IR GENERIC HISTORICAL  01/07/2016   IR GJ TUBE CHANGE 01/07/2016 Malachy Moan, MD [facility]  . IR GENERIC HISTORICAL  01/27/2016   IR MECH REMOV OBSTRUC MAT ANY COLON TUBE W/FLUORO 01/27/2016 Richarda Overlie, MD [facility]    . IR GENERIC HISTORICAL  02/07/2016   IR PATIENT EVAL TECH 0-60 MINS Darrell K Allred, PA-C [facility]  . IR GENERIC HISTORICAL  02/08/2016   IR GJ TUBE CHANGE 02/08/2016 Berdine Dance, MD [facility]  . LAPAROTOMY N/A 01/20/2015   Procedure: EXPLORATORY LAPAROTOMY;  Surgeon: Abigail Miyamoto, MD;  Location: [facility] OR;  Service: General;  Laterality: N/A;  . LYSIS OF ADHESION N/A 01/20/2015   Procedure: LYSIS OF ADHESIONS < 1 HOUR;  Surgeon: Abigail Miyamoto, MD;  Location: [facility] OR;  Service: General;  Laterality: N/A;  . TONSILLECTOMY    .  TUBAL LIGATION    . VENTRAL HERNIA REPAIR  2015   incarcerated ventral hernia ([facility] 09/2013)/notes 01/10/2015   Social History   Occupational History  . Not on file.   Social History Main Topics  . Smoking status: Never Smoker  . Smokeless tobacco: Former Neurosurgeon    Types: Snuff     Comment: "used snuff in my younger days"  . Alcohol use No  . Drug use: No  . Sexual activity: No

## 2016-03-01 ENCOUNTER — Telehealth: Payer: Self-pay | Admitting: Cardiology

## 2016-03-01 DIAGNOSIS — I480 Paroxysmal atrial fibrillation: Secondary | ICD-10-CM | POA: Diagnosis not present

## 2016-03-01 DIAGNOSIS — J9601 Acute respiratory failure with hypoxia: Secondary | ICD-10-CM | POA: Diagnosis not present

## 2016-03-01 NOTE — Telephone Encounter (Signed)
Contacted Anna at [facility] to inform her that per our [place], both amiodarone and Advair are safe for the pt to take.  Per Jinny Blossom, she reviewed the pts last EKG from yesterday, and her QT interval was WNL's, which can be a potential interaction for the pt, when taking both Advair and Amiodarone.  Informed Vicente Males at [facility] that its ok to fill both meds for this pt.  Vicente Males verbalized understanding and agrees with this plan.  Vicente Males gracious for all the assistance provided.

## 2016-03-01 NOTE — Telephone Encounter (Signed)
Shannon [name], we can see her tomorrow.

## 2016-03-01 NOTE — Telephone Encounter (Signed)
Spoke to the pts daughter and informed her that per Dr Meda Coffee, we can add the pt onto our quarter morning for tomorrow 11/30 at 0930, for complaints mentioned.  Daughter verbalized understanding and agrees to this plan.  Daughter very gracious for all the assistance provided.  Chart prep aware of add on.

## 2016-03-01 NOTE — Telephone Encounter (Signed)
I spoke with patient's daughter, [name] (on [place]).  She states pt seen in ED on Monday for SOB and chest pain, put on fluid restriction (1562mL) and Sodium tabs for 3 days, then sent home.  She states pt still c/o SOB and chest pain is better (position change helps). She reports pt has left leg swelling, slight right leg swelling with 5 lb weight gain since yesterday (148lb to 153lb).  She denies pt on diuretic. She denies pt having pain at this time. She states patient is not in distress at this time. She states pt admits to not taking Advair as prescribed prior to [facility] ED visit. She states there was a pharmacy issue with Advair and Amio, I see that is resolved now.  I advised her to pick up the Advair so patient could start taking it.  I advised daughter I would forward this message to Dr. Francesca Oman nurse for further review and we would call her back with recommendations.

## 2016-03-01 NOTE — Telephone Encounter (Signed)
New Message:   There is interaction between Advair and Amiodarone,is this ok to refill?

## 2016-03-01 NOTE — Telephone Encounter (Signed)
Pt's daughter [name] calling.  Pt c/o swelling: STAT is pt has developed SOB within 24 hours  1. How long have you been experiencing swelling? This week  2. Where is the swelling located? Left leg  3.  Are you currently taking a "fluid pill"?No  4.  Are you currently SOB? Yes..wheezing since Monday but it's getting worse today  5.  Have you traveled recently?No   Pt's weight yesterday was 148lbs and today it's 153lbs

## 2016-03-02 ENCOUNTER — Emergency Department (HOSPITAL_COMMUNITY): Payer: Medicare Other

## 2016-03-02 ENCOUNTER — Encounter (HOSPITAL_COMMUNITY): Payer: Self-pay | Admitting: Emergency Medicine

## 2016-03-02 ENCOUNTER — Inpatient Hospital Stay (HOSPITAL_COMMUNITY)
Admission: EM | Admit: 2016-03-02 | Discharge: 2016-03-14 | DRG: 291 | Disposition: A | Discharge status: Home / Self Care | Payer: Medicare Other | Attending: Cardiology | Admitting: Cardiology

## 2016-03-02 ENCOUNTER — Encounter: Payer: Self-pay | Admitting: Cardiology

## 2016-03-02 ENCOUNTER — Ambulatory Visit (INDEPENDENT_AMBULATORY_CARE_PROVIDER_SITE_OTHER): Payer: Medicare Other | Admitting: Cardiology

## 2016-03-02 ENCOUNTER — Other Ambulatory Visit: Payer: Self-pay | Admitting: Internal Medicine

## 2016-03-02 VITALS — BP 140/60 | HR 74 | Ht 66.0 in | Wt 156.6 lb

## 2016-03-02 DIAGNOSIS — Z431 Encounter for attention to gastrostomy: Secondary | ICD-10-CM | POA: Diagnosis not present

## 2016-03-02 DIAGNOSIS — Z8711 Personal history of peptic ulcer disease: Secondary | ICD-10-CM | POA: Diagnosis not present

## 2016-03-02 DIAGNOSIS — K9423 Gastrostomy malfunction: Secondary | ICD-10-CM

## 2016-03-02 DIAGNOSIS — I482 Chronic atrial fibrillation: Secondary | ICD-10-CM | POA: Diagnosis present

## 2016-03-02 DIAGNOSIS — R0602 Shortness of breath: Secondary | ICD-10-CM

## 2016-03-02 DIAGNOSIS — Z9889 Other specified postprocedural states: Secondary | ICD-10-CM | POA: Diagnosis not present

## 2016-03-02 DIAGNOSIS — L8990 Pressure ulcer of unspecified site, unspecified stage: Secondary | ICD-10-CM | POA: Insufficient documentation

## 2016-03-02 DIAGNOSIS — R079 Chest pain, unspecified: Secondary | ICD-10-CM | POA: Diagnosis not present

## 2016-03-02 DIAGNOSIS — R2689 Other abnormalities of gait and mobility: Secondary | ICD-10-CM

## 2016-03-02 DIAGNOSIS — Z931 Gastrostomy status: Secondary | ICD-10-CM

## 2016-03-02 DIAGNOSIS — R05 Cough: Secondary | ICD-10-CM | POA: Diagnosis not present

## 2016-03-02 DIAGNOSIS — K219 Gastro-esophageal reflux disease without esophagitis: Secondary | ICD-10-CM | POA: Diagnosis present

## 2016-03-02 DIAGNOSIS — Z823 Family history of stroke: Secondary | ICD-10-CM

## 2016-03-02 DIAGNOSIS — I5084 End stage heart failure: Secondary | ICD-10-CM

## 2016-03-02 DIAGNOSIS — Z7901 Long term (current) use of anticoagulants: Secondary | ICD-10-CM

## 2016-03-02 DIAGNOSIS — Z8249 Family history of ischemic heart disease and other diseases of the circulatory system: Secondary | ICD-10-CM

## 2016-03-02 DIAGNOSIS — I4891 Unspecified atrial fibrillation: Secondary | ICD-10-CM | POA: Diagnosis present

## 2016-03-02 DIAGNOSIS — J9600 Acute respiratory failure, unspecified whether with hypoxia or hypercapnia: Secondary | ICD-10-CM | POA: Diagnosis not present

## 2016-03-02 DIAGNOSIS — Z7951 Long term (current) use of inhaled steroids: Secondary | ICD-10-CM

## 2016-03-02 DIAGNOSIS — D638 Anemia in other chronic diseases classified elsewhere: Secondary | ICD-10-CM | POA: Diagnosis present

## 2016-03-02 DIAGNOSIS — I130 Hypertensive heart and chronic kidney disease with heart failure and stage 1 through stage 4 chronic kidney disease, or unspecified chronic kidney disease: Principal | ICD-10-CM | POA: Diagnosis present

## 2016-03-02 DIAGNOSIS — Z86718 Personal history of other venous thrombosis and embolism: Secondary | ICD-10-CM

## 2016-03-02 DIAGNOSIS — I110 Hypertensive heart disease with heart failure: Secondary | ICD-10-CM | POA: Diagnosis not present

## 2016-03-02 DIAGNOSIS — Z9841 Cataract extraction status, right eye: Secondary | ICD-10-CM | POA: Diagnosis not present

## 2016-03-02 DIAGNOSIS — E871 Hypo-osmolality and hyponatremia: Secondary | ICD-10-CM | POA: Diagnosis present

## 2016-03-02 DIAGNOSIS — Z7189 Other specified counseling: Secondary | ICD-10-CM | POA: Diagnosis not present

## 2016-03-02 DIAGNOSIS — Z9049 Acquired absence of other specified parts of digestive tract: Secondary | ICD-10-CM | POA: Diagnosis not present

## 2016-03-02 DIAGNOSIS — I481 Persistent atrial fibrillation: Secondary | ICD-10-CM | POA: Diagnosis not present

## 2016-03-02 DIAGNOSIS — I6523 Occlusion and stenosis of bilateral carotid arteries: Secondary | ICD-10-CM

## 2016-03-02 DIAGNOSIS — I509 Heart failure, unspecified: Secondary | ICD-10-CM

## 2016-03-02 DIAGNOSIS — J9620 Acute and chronic respiratory failure, unspecified whether with hypoxia or hypercapnia: Secondary | ICD-10-CM | POA: Diagnosis not present

## 2016-03-02 DIAGNOSIS — R627 Adult failure to thrive: Secondary | ICD-10-CM | POA: Diagnosis present

## 2016-03-02 DIAGNOSIS — E119 Type 2 diabetes mellitus without complications: Secondary | ICD-10-CM

## 2016-03-02 DIAGNOSIS — N183 Chronic kidney disease, stage 3 unspecified: Secondary | ICD-10-CM | POA: Diagnosis present

## 2016-03-02 DIAGNOSIS — Z961 Presence of intraocular lens: Secondary | ICD-10-CM | POA: Diagnosis present

## 2016-03-02 DIAGNOSIS — R059 Cough, unspecified: Secondary | ICD-10-CM

## 2016-03-02 DIAGNOSIS — I483 Typical atrial flutter: Secondary | ICD-10-CM | POA: Diagnosis not present

## 2016-03-02 DIAGNOSIS — Z833 Family history of diabetes mellitus: Secondary | ICD-10-CM

## 2016-03-02 DIAGNOSIS — I5032 Chronic diastolic (congestive) heart failure: Secondary | ICD-10-CM

## 2016-03-02 DIAGNOSIS — J449 Chronic obstructive pulmonary disease, unspecified: Secondary | ICD-10-CM | POA: Diagnosis present

## 2016-03-02 DIAGNOSIS — I5033 Acute on chronic diastolic (congestive) heart failure: Secondary | ICD-10-CM

## 2016-03-02 DIAGNOSIS — Z9842 Cataract extraction status, left eye: Secondary | ICD-10-CM | POA: Diagnosis not present

## 2016-03-02 DIAGNOSIS — J9601 Acute respiratory failure with hypoxia: Secondary | ICD-10-CM | POA: Diagnosis not present

## 2016-03-02 DIAGNOSIS — I2510 Atherosclerotic heart disease of native coronary artery without angina pectoris: Secondary | ICD-10-CM | POA: Diagnosis present

## 2016-03-02 DIAGNOSIS — I4892 Unspecified atrial flutter: Secondary | ICD-10-CM | POA: Diagnosis present

## 2016-03-02 DIAGNOSIS — I82402 Acute embolism and thrombosis of unspecified deep veins of left lower extremity: Secondary | ICD-10-CM | POA: Diagnosis present

## 2016-03-02 DIAGNOSIS — J690 Pneumonitis due to inhalation of food and vomit: Secondary | ICD-10-CM | POA: Diagnosis present

## 2016-03-02 DIAGNOSIS — E039 Hypothyroidism, unspecified: Secondary | ICD-10-CM | POA: Diagnosis present

## 2016-03-02 DIAGNOSIS — Z8673 Personal history of transient ischemic attack (TIA), and cerebral infarction without residual deficits: Secondary | ICD-10-CM

## 2016-03-02 DIAGNOSIS — Z79899 Other long term (current) drug therapy: Secondary | ICD-10-CM

## 2016-03-02 DIAGNOSIS — E1122 Type 2 diabetes mellitus with diabetic chronic kidney disease: Secondary | ICD-10-CM | POA: Diagnosis not present

## 2016-03-02 DIAGNOSIS — R1310 Dysphagia, unspecified: Secondary | ICD-10-CM

## 2016-03-02 DIAGNOSIS — H409 Unspecified glaucoma: Secondary | ICD-10-CM | POA: Diagnosis present

## 2016-03-02 DIAGNOSIS — R001 Bradycardia, unspecified: Secondary | ICD-10-CM | POA: Diagnosis present

## 2016-03-02 DIAGNOSIS — Z515 Encounter for palliative care: Secondary | ICD-10-CM | POA: Diagnosis not present

## 2016-03-02 DIAGNOSIS — Z9851 Tubal ligation status: Secondary | ICD-10-CM

## 2016-03-02 DIAGNOSIS — Z4682 Encounter for fitting and adjustment of non-vascular catheter: Secondary | ICD-10-CM | POA: Diagnosis not present

## 2016-03-02 DIAGNOSIS — K311 Adult hypertrophic pyloric stenosis: Secondary | ICD-10-CM | POA: Diagnosis present

## 2016-03-02 DIAGNOSIS — E1165 Type 2 diabetes mellitus with hyperglycemia: Secondary | ICD-10-CM | POA: Diagnosis present

## 2016-03-02 DIAGNOSIS — F17220 Nicotine dependence, chewing tobacco, uncomplicated: Secondary | ICD-10-CM | POA: Diagnosis present

## 2016-03-02 LAB — CBC WITH DIFFERENTIAL/PLATELET
Basophils Absolute: 0 10*3/uL (ref 0.0–0.1)
Basophils Relative: 0 %
Eosinophils Absolute: 0.1 10*3/uL (ref 0.0–0.7)
Eosinophils Relative: 1 %
HCT: 23.9 % — ABNORMAL LOW (ref 36.0–46.0)
Hemoglobin: 8.2 g/dL — ABNORMAL LOW (ref 12.0–15.0)
Lymphocytes Relative: 7 %
Lymphs Abs: 0.4 10*3/uL — ABNORMAL LOW (ref 0.7–4.0)
MCH: 28.9 pg (ref 26.0–34.0)
MCHC: 34.3 g/dL (ref 30.0–36.0)
MCV: 84.2 fL (ref 78.0–100.0)
Monocytes Absolute: 0.7 10*3/uL (ref 0.1–1.0)
Monocytes Relative: 12 %
Neutro Abs: 4.5 10*3/uL (ref 1.7–7.7)
Neutrophils Relative %: 80 %
Platelets: 285 10*3/uL (ref 150–400)
RBC: 2.84 MIL/uL — ABNORMAL LOW (ref 3.87–5.11)
RDW: 17.4 % — ABNORMAL HIGH (ref 11.5–15.5)
WBC: 5.7 10*3/uL (ref 4.0–10.5)

## 2016-03-02 LAB — PROTIME-INR
INR: 1.67
Prothrombin Time: 19.9 seconds — ABNORMAL HIGH (ref 11.4–15.2)

## 2016-03-02 LAB — COMPREHENSIVE METABOLIC PANEL
ALT: 30 U/L (ref 14–54)
AST: 30 U/L (ref 15–41)
Albumin: 3.4 g/dL — ABNORMAL LOW (ref 3.5–5.0)
Alkaline Phosphatase: 61 U/L (ref 38–126)
Anion gap: 9 (ref 5–15)
BUN: 34 mg/dL — ABNORMAL HIGH (ref 6–20)
CO2: 23 mmol/L (ref 22–32)
Calcium: 8.9 mg/dL (ref 8.9–10.3)
Chloride: 89 mmol/L — ABNORMAL LOW (ref 101–111)
Creatinine, Ser: 1.26 mg/dL — ABNORMAL HIGH (ref 0.44–1.00)
GFR calc Af Amer: 42 mL/min — ABNORMAL LOW (ref >60)
GFR calc non Af Amer: 36 mL/min — ABNORMAL LOW (ref >60)
Glucose, Bld: 157 mg/dL — ABNORMAL HIGH (ref 65–99)
Potassium: 5.3 mmol/L — ABNORMAL HIGH (ref 3.5–5.1)
Sodium: 121 mmol/L — ABNORMAL LOW (ref 135–145)
Total Bilirubin: 0.5 mg/dL (ref 0.3–1.2)
Total Protein: 6.4 g/dL — ABNORMAL LOW (ref 6.5–8.1)

## 2016-03-02 LAB — GLUCOSE, CAPILLARY: Glucose-Capillary: 313 mg/dL — ABNORMAL HIGH (ref 65–99)

## 2016-03-02 LAB — I-STAT CG4 LACTIC ACID, ED
Lactic Acid, Venous: 0.96 mmol/L (ref 0.5–1.9)
Lactic Acid, Venous: 1.83 mmol/L (ref 0.5–1.9)

## 2016-03-02 LAB — I-STAT TROPONIN, ED: Troponin i, poc: 0.02 ng/mL (ref 0.00–0.08)

## 2016-03-02 LAB — MRSA PCR SCREENING: MRSA by PCR: POSITIVE — AB

## 2016-03-02 LAB — CBG MONITORING, ED: Glucose-Capillary: 236 mg/dL — ABNORMAL HIGH (ref 65–99)

## 2016-03-02 LAB — BRAIN NATRIURETIC PEPTIDE: B Natriuretic Peptide: 515.7 pg/mL — ABNORMAL HIGH (ref 0.0–100.0)

## 2016-03-02 MED ORDER — CYCLOSPORINE 0.05 % OP EMUL
1.0000 [drp] | Freq: Two times a day (BID) | OPHTHALMIC | Status: DC
  Administered 2016-03-02 – 2016-03-14 (×24): 1 [drp] via OPHTHALMIC
  Filled 2016-03-02 (×25): qty 1

## 2016-03-02 MED ORDER — ACETAMINOPHEN 325 MG PO TABS: 650.0000 mg | ORAL_TABLET | ORAL | Status: DC | PRN

## 2016-03-02 MED ORDER — SODIUM CHLORIDE 0.9% FLUSH
3.0000 mL | Freq: Two times a day (BID) | INTRAVENOUS | Status: DC
  Administered 2016-03-02 – 2016-03-14 (×23): 3 mL via INTRAVENOUS

## 2016-03-02 MED ORDER — ALBUTEROL SULFATE (2.5 MG/3ML) 0.083% IN NEBU
INHALATION_SOLUTION | RESPIRATORY_TRACT | Status: AC
  Filled 2016-03-02: qty 6

## 2016-03-02 MED ORDER — HYDRALAZINE HCL 50 MG PO TABS
50.0000 mg | ORAL_TABLET | Freq: Three times a day (TID) | ORAL | Status: DC
  Administered 2016-03-02 – 2016-03-14 (×36): 50 mg via ORAL
  Filled 2016-03-02 (×37): qty 1

## 2016-03-02 MED ORDER — IPRATROPIUM-ALBUTEROL 0.5-2.5 (3) MG/3ML IN SOLN: 3.0000 mL | Freq: Four times a day (QID) | RESPIRATORY_TRACT | Status: DC | PRN

## 2016-03-02 MED ORDER — ALBUTEROL SULFATE (2.5 MG/3ML) 0.083% IN NEBU
2.5000 mg | INHALATION_SOLUTION | Freq: Four times a day (QID) | RESPIRATORY_TRACT | Status: DC | PRN
  Administered 2016-03-03: 2.5 mg via RESPIRATORY_TRACT
  Filled 2016-03-02: qty 3

## 2016-03-02 MED ORDER — LEVOTHYROXINE SODIUM 112 MCG PO TABS
137.0000 ug | ORAL_TABLET | Freq: Every day | ORAL | Status: DC
  Administered 2016-03-03 – 2016-03-14 (×12): 137 ug via ORAL
  Filled 2016-03-02 (×12): qty 1

## 2016-03-02 MED ORDER — MOMETASONE FURO-FORMOTEROL FUM 200-5 MCG/ACT IN AERO
2.0000 | INHALATION_SPRAY | Freq: Two times a day (BID) | RESPIRATORY_TRACT | Status: DC
  Administered 2016-03-02 – 2016-03-14 (×20): 2 via RESPIRATORY_TRACT
  Filled 2016-03-02 (×2): qty 8.8

## 2016-03-02 MED ORDER — ALBUTEROL SULFATE (2.5 MG/3ML) 0.083% IN NEBU
5.0000 mg | INHALATION_SOLUTION | Freq: Once | RESPIRATORY_TRACT | Status: AC
  Administered 2016-03-02: 5 mg via RESPIRATORY_TRACT

## 2016-03-02 MED ORDER — POLYVINYL ALCOHOL 1.4 % OP SOLN
1.0000 [drp] | Freq: Four times a day (QID) | OPHTHALMIC | Status: DC
  Administered 2016-03-02 – 2016-03-14 (×48): 1 [drp] via OPHTHALMIC
  Filled 2016-03-02: qty 15

## 2016-03-02 MED ORDER — ISOSORBIDE MONONITRATE ER 30 MG PO TB24
30.0000 mg | ORAL_TABLET | Freq: Every day | ORAL | Status: DC
Start: 2016-03-03 — End: 2016-03-14
  Administered 2016-03-03 – 2016-03-14 (×12): 30 mg via ORAL
  Filled 2016-03-02 (×12): qty 1

## 2016-03-02 MED ORDER — CHLORHEXIDINE GLUCONATE CLOTH 2 % EX PADS
6.0000 | MEDICATED_PAD | Freq: Every day | CUTANEOUS | Status: AC
  Administered 2016-03-03 – 2016-03-07 (×5): 6 via TOPICAL

## 2016-03-02 MED ORDER — WARFARIN SODIUM 2 MG PO TABS
4.0000 mg | ORAL_TABLET | ORAL | Status: AC
  Administered 2016-03-02: 4 mg via ORAL
  Filled 2016-03-02: qty 2

## 2016-03-02 MED ORDER — SODIUM CHLORIDE 0.9 % IV SOLN: 250.0000 mL | INTRAVENOUS | Status: DC | PRN

## 2016-03-02 MED ORDER — ONDANSETRON HCL 4 MG/2ML IJ SOLN
4.0000 mg | Freq: Four times a day (QID) | INTRAMUSCULAR | Status: DC | PRN
  Administered 2016-03-13: 4 mg via INTRAVENOUS
  Filled 2016-03-02: qty 2

## 2016-03-02 MED ORDER — SERTRALINE HCL 100 MG PO TABS
100.0000 mg | ORAL_TABLET | Freq: Every day | ORAL | Status: DC
  Administered 2016-03-02 – 2016-03-14 (×13): 100 mg via ORAL
  Filled 2016-03-02 (×13): qty 1

## 2016-03-02 MED ORDER — SIMETHICONE 80 MG PO CHEW
80.0000 mg | CHEWABLE_TABLET | Freq: Four times a day (QID) | ORAL | Status: DC | PRN
  Administered 2016-03-05 – 2016-03-13 (×4): 80 mg via ORAL
  Filled 2016-03-02 (×4): qty 1

## 2016-03-02 MED ORDER — SODIUM CHLORIDE 0.9 % IV SOLN: INTRAVENOUS | Status: DC

## 2016-03-02 MED ORDER — TRAMADOL HCL 50 MG PO TABS
50.0000 mg | ORAL_TABLET | Freq: Every day | ORAL | Status: DC | PRN
  Administered 2016-03-03 – 2016-03-09 (×4): 50 mg via ORAL
  Filled 2016-03-02 (×4): qty 1

## 2016-03-02 MED ORDER — WARFARIN - PHARMACIST DOSING INPATIENT
Freq: Every day | Status: DC
  Administered 2016-03-03: 1
  Administered 2016-03-04 – 2016-03-13 (×7)

## 2016-03-02 MED ORDER — AMIODARONE HCL 200 MG PO TABS
200.0000 mg | ORAL_TABLET | Freq: Every day | ORAL | Status: DC
  Administered 2016-03-03 – 2016-03-14 (×12): 200 mg via ORAL
  Filled 2016-03-02 (×12): qty 1

## 2016-03-02 MED ORDER — METHYLPREDNISOLONE SODIUM SUCC 125 MG IJ SOLR
125.0000 mg | Freq: Once | INTRAMUSCULAR | Status: AC
  Administered 2016-03-02: 125 mg via INTRAVENOUS
  Filled 2016-03-02: qty 2

## 2016-03-02 MED ORDER — FUROSEMIDE 10 MG/ML IJ SOLN
40.0000 mg | Freq: Four times a day (QID) | INTRAMUSCULAR | Status: DC
  Administered 2016-03-02 – 2016-03-03 (×3): 40 mg via INTRAVENOUS
  Filled 2016-03-02 (×3): qty 4

## 2016-03-02 MED ORDER — POLYETHYL GLYCOL-PROPYL GLYCOL 0.4-0.3 % OP SOLN: 1.0000 [drp] | Freq: Four times a day (QID) | OPHTHALMIC | Status: DC

## 2016-03-02 MED ORDER — LORATADINE 10 MG PO TABS
10.0000 mg | ORAL_TABLET | Freq: Every day | ORAL | Status: DC
  Administered 2016-03-03 – 2016-03-14 (×12): 10 mg via ORAL
  Filled 2016-03-02 (×11): qty 1

## 2016-03-02 MED ORDER — INSULIN ASPART 100 UNIT/ML ~~LOC~~ SOLN
0.0000 [IU] | Freq: Three times a day (TID) | SUBCUTANEOUS | Status: DC
  Administered 2016-03-03 (×2): 2 [IU] via SUBCUTANEOUS
  Administered 2016-03-03: 3 [IU] via SUBCUTANEOUS
  Administered 2016-03-04 (×2): 2 [IU] via SUBCUTANEOUS
  Administered 2016-03-04: 5 [IU] via SUBCUTANEOUS
  Administered 2016-03-05 (×3): 3 [IU] via SUBCUTANEOUS
  Administered 2016-03-06: 2 [IU] via SUBCUTANEOUS
  Administered 2016-03-06: 3 [IU] via SUBCUTANEOUS
  Administered 2016-03-06: 7 [IU] via SUBCUTANEOUS
  Administered 2016-03-07 (×3): 3 [IU] via SUBCUTANEOUS
  Administered 2016-03-08: 2 [IU] via SUBCUTANEOUS
  Administered 2016-03-08: 3 [IU] via SUBCUTANEOUS
  Administered 2016-03-08: 7 [IU] via SUBCUTANEOUS
  Administered 2016-03-09: 5 [IU] via SUBCUTANEOUS
  Administered 2016-03-09: 2 [IU] via SUBCUTANEOUS
  Administered 2016-03-09: 3 [IU] via SUBCUTANEOUS
  Administered 2016-03-10: 5 [IU] via SUBCUTANEOUS

## 2016-03-02 MED ORDER — ALBUTEROL SULFATE (2.5 MG/3ML) 0.083% IN NEBU
5.0000 mg | INHALATION_SOLUTION | Freq: Once | RESPIRATORY_TRACT | Status: AC
  Administered 2016-03-02: 5 mg via RESPIRATORY_TRACT
  Filled 2016-03-02: qty 6

## 2016-03-02 MED ORDER — ATORVASTATIN CALCIUM 10 MG PO TABS
10.0000 mg | ORAL_TABLET | Freq: Every day | ORAL | Status: DC
  Administered 2016-03-02 – 2016-03-13 (×12): 10 mg via ORAL
  Filled 2016-03-02 (×13): qty 1

## 2016-03-02 MED ORDER — PANTOPRAZOLE SODIUM 40 MG PO TBEC
40.0000 mg | DELAYED_RELEASE_TABLET | Freq: Two times a day (BID) | ORAL | Status: DC
  Administered 2016-03-02 – 2016-03-14 (×24): 40 mg via ORAL
  Filled 2016-03-02 (×24): qty 1

## 2016-03-02 MED ORDER — LEVALBUTEROL TARTRATE 45 MCG/ACT IN AERO: 2.0000 | INHALATION_SPRAY | Freq: Four times a day (QID) | RESPIRATORY_TRACT | Status: DC | PRN

## 2016-03-02 MED ORDER — SODIUM CHLORIDE 0.9% FLUSH: 3.0000 mL | INTRAVENOUS | Status: DC | PRN

## 2016-03-02 MED ORDER — FLUTICASONE PROPIONATE 50 MCG/ACT NA SUSP
2.0000 | Freq: Every day | NASAL | Status: DC
  Administered 2016-03-03 – 2016-03-14 (×12): 2 via NASAL
  Filled 2016-03-02: qty 16

## 2016-03-02 MED ORDER — HYDROCODONE-ACETAMINOPHEN 5-325 MG PO TABS
1.0000 | ORAL_TABLET | Freq: Four times a day (QID) | ORAL | Status: DC | PRN
  Administered 2016-03-02: 1 via ORAL
  Filled 2016-03-02 (×2): qty 1

## 2016-03-02 MED ORDER — DILTIAZEM HCL ER COATED BEADS 120 MG PO CP24
120.0000 mg | ORAL_CAPSULE | Freq: Every day | ORAL | Status: DC
Start: 2016-03-03 — End: 2016-03-14
  Administered 2016-03-03 – 2016-03-14 (×12): 120 mg via ORAL
  Filled 2016-03-02 (×12): qty 1

## 2016-03-02 MED ORDER — GABAPENTIN 300 MG PO CAPS
300.0000 mg | ORAL_CAPSULE | Freq: Three times a day (TID) | ORAL | Status: DC
  Administered 2016-03-02 – 2016-03-14 (×37): 300 mg via ORAL
  Filled 2016-03-02 (×37): qty 1

## 2016-03-02 MED ORDER — MUPIROCIN 2 % EX OINT
1.0000 "application " | TOPICAL_OINTMENT | Freq: Two times a day (BID) | CUTANEOUS | Status: AC
  Administered 2016-03-02 – 2016-03-07 (×10): 1 via NASAL
  Filled 2016-03-02 (×3): qty 22

## 2016-03-02 MED ORDER — LATANOPROST 0.005 % OP SOLN
1.0000 [drp] | Freq: Every day | OPHTHALMIC | Status: DC
  Administered 2016-03-02 – 2016-03-13 (×12): 1 [drp] via OPHTHALMIC
  Filled 2016-03-02: qty 2.5

## 2016-03-02 NOTE — Progress Notes (Signed)
ANTICOAGULATION CONSULT NOTE - Initial Consult  Pharmacy Consult for Coumadin Indication: Afib/Aflutter  Allergies  Allergen Reactions  . Penicillins Itching    Has patient had a PCN reaction causing immediate rash, facial/tongue/throat swelling, SOB or lightheadedness with hypotension: Unknown Has patient had a PCN reaction causing severe rash involving mucus membranes or skin necrosis: Unknown Has patient had a PCN reaction that required hospitalization: Unknown Has patient had a PCN reaction occurring within the last 10 years: Unknown If all of the above answers are "NO", then may proceed with Cephalosporin use.     Patient Measurements: Height: 5\' 6"  (167.6 cm) Weight: 156 lb 9.6 oz (71 kg) IBW/kg (Calculated) : 59.3  Vital Signs: Temp: 98.4 F (36.9 C) (11/30 2009) Temp Source: Oral (11/30 2009) BP: 165/68 (11/30 2009) Pulse Rate: 92 (11/30 2009)  Labs:  Recent Labs  03/02/16 1210  HGB 8.2*  HCT 23.9*  PLT 285  LABPROT 19.9*  INR 1.67  CREATININE 1.26*    Estimated Creatinine Clearance: 27.8 mL/min (by C-G formula based on SCr of 1.26 mg/dL (H)).   Medical History: Past Medical History:  Diagnosis Date  . Anemia    previous blood transfusions  . Arthritis    "all over"  . Asthma   . Bradycardia    requiring previous d/c of BB and reduction of amiodarone  . CAD (coronary artery disease)    nonobstructive per notes  . Chronic diastolic CHF (congestive heart failure) ([place])   . CKD (chronic kidney disease), stage III   . Complication of blood transfusion    "got the wrong blood type at [facility] in ~ 2015; no adverse reaction that we are aware of"/daughter, [name] (01/27/2016)  . COPD (chronic obstructive pulmonary disease) ([place])   . Depression    "light case"  . DVT (deep venous thrombosis) ([place]) 01/2016   a. LLE DVT 01/2016 - switched from Eliquis to Coumadin.  . Gastric stenosis    a. s/p stomach tube  . GERD (gastroesophageal reflux disease)    . History of blood transfusion    "several" (01/27/2016)  . History of stomach ulcers   . Hyperlipidemia   . Hypertension   . Hypothyroidism   . Paraesophageal hernia   . Perforated gastric ulcer ([place])   . Seasonal allergies   . SIADH (syndrome of inappropriate ADH production) ([place])    [name] 01/10/2015  . Small bowel obstruction    "I don't know how many" (01/11/2015)  . Stroke ([place])    "light one"  . Type II diabetes mellitus ([place])    "related to prednisone use  for > 20 yr; once predinose stopped; no more DM RX" (01/27/2016)  . Ventral hernia with bowel obstruction     Medications:  Prescriptions Prior to Admission  Medication Sig Dispense Refill Last Dose  . acetaminophen (TYLENOL) 325 MG tablet Take 650 mg by mouth 2 (two) times daily as needed for mild pain.    03/01/2016 at Unknown time  . ADVAIR DISKUS 250-50 MCG/DOSE AEPB INHALE 1 PUFF INTO THE LUNGS 2 (TWO) TIMES DAILY AS NEEDED FOR SHORTNESS OF BREATH  3 03/01/2016 at Unknown time  . AMINO ACIDS-PROTEIN HYDROLYS PO Take 30 mLs by mouth 2 (two) times daily.   03/01/2016 at Unknown time  . amiodarone (PACERONE) 200 MG tablet Take 200 mg by mouth daily.   03/01/2016 at Unknown time  . amLODipine (NORVASC) 10 MG tablet TAKE 1 TABLET BY MOUTH DAILY FOR HYPERTENSION 90 tablet 1 03/01/2016 at  Unknown time  . atorvastatin (LIPITOR) 10 MG tablet Take 10 mg by mouth daily.   03/01/2016 at Unknown time  . b complex vitamins tablet Take 1 tablet by mouth daily.    03/01/2016 at Unknown time  . bisacodyl (DULCOLAX) 10 MG suppository Place 1 suppository (10 mg total) rectally daily as needed for moderate constipation. 12 suppository 0 Past Month at Unknown time  . calcium-vitamin D (OSCAL WITH D) 500-200 MG-UNIT per tablet Take 1 tablet by mouth daily with breakfast.    03/01/2016 at Unknown time  . cefUROXime (CEFTIN) 250 MG tablet Take 250 mg by mouth 2 (two) times daily.   03/01/2016 at Unknown time  . cetirizine (ZYRTEC) 10 MG  tablet Take 10 mg by mouth daily as needed for allergies or rhinitis.   Past Week at Unknown time  . chlorhexidine (PERIDEX) 0.12 % solution Use as directed 15 mLs in the mouth or throat 2 (two) times daily.   03/01/2016 at Unknown time  . diltiazem (CARDIZEM CD) 120 MG 24 hr capsule Take 1 capsule (120 mg total) by mouth daily. 90 capsule 3 03/01/2016 at Unknown time  . feeding supplement (BOOST / RESOURCE BREEZE) LIQD Take 1 Container by mouth daily.   03/01/2016 at Unknown time  . fluticasone (FLONASE) 50 MCG/ACT nasal spray Place 2 sprays into both nostrils daily. 16 g 3 03/01/2016 at Unknown time  . gabapentin (NEURONTIN) 300 MG capsule Take 1 capsule (300 mg total) by mouth 3 (three) times daily. 90 capsule 12 03/01/2016 at Unknown time  . hydrALAZINE (APRESOLINE) 50 MG tablet Take 1 tablet (50 mg total) by mouth 3 (three) times daily. 120 tablet 0 03/01/2016 at Unknown time  . HYDROcodone-acetaminophen (NORCO) 5-325 MG tablet Take 1 tablet by mouth every 6 (six) hours as needed for moderate pain. 30 tablet 0 Past Month at Unknown time  . ipratropium-albuterol (DUONEB) 0.5-2.5 (3) MG/3ML SOLN Take 3 mLs by nebulization every 6 (six) hours as needed (sob). Use 3 times daily x 4 days then every 6 hours as needed. 360 mL 4 03/01/2016 at Unknown time  . isosorbide mononitrate (IMDUR) 30 MG 24 hr tablet TAKE 1 TABLET (30 MG TOTAL) BY MOUTH DAILY.  2 03/01/2016 at Unknown time  . latanoprost (XALATAN) 0.005 % ophthalmic solution Place 1 drop into both eyes at bedtime. 2.5 mL 12 03/01/2016 at Unknown time  . levalbuterol (XOPENEX HFA) 45 MCG/ACT inhaler Inhale 2 puffs into the lungs every 6 (six) hours as needed for wheezing or shortness of breath. 1 Inhaler 12 03/02/2016 at Unknown time  . levothyroxine (SYNTHROID, LEVOTHROID) 137 MCG tablet Take 1 tablet (137 mcg total) by mouth daily before breakfast. 30 tablet 2 03/02/2016 at Unknown time  . Maltodextrin-Xanthan Gum (RESOURCE THICKENUP CLEAR) POWD  Take 120 g by mouth as needed (use to thicken liquids.). 20 Can 0 03/01/2016 at Unknown time  . Multiple Vitamin (MULTIVITAMIN WITH MINERALS) TABS tablet Take 1 tablet by mouth daily.   03/01/2016 at Unknown time  . Nutritional Supplements (FEEDING SUPPLEMENT, OSMOLITE 1.5 CAL,) LIQD Place 1,000 mLs into feeding tube daily. 1000 mL 3 03/01/2016 at Unknown time  . pantoprazole (PROTONIX) 40 MG tablet TAKE 1 TABLET BY MOUTH TWICE A DAY 60 tablet 5 03/02/2016 at Unknown time  . Polyethyl Glycol-Propyl Glycol 0.4-0.3 % SOLN Place 1 drop into both eyes 4 (four) times daily.    03/02/2016 at Unknown time  . polyethylene glycol powder (GLYCOLAX/MIRALAX) powder MIX 17GRAMS IN 8 OUNCES OF LIQUID  AND DRINK TWICE DAILY AS NEEDED 3162 g 1 Past Week at Unknown time  . RESTASIS 0.05 % ophthalmic emulsion USE 1 DROP INTO BOTH EYES TWICE DAILY 60 mL 0 03/02/2016 at Unknown time  . sertraline (ZOLOFT) 100 MG tablet Take one tablet by mouth once daily for depression 90 tablet 1 03/01/2016 at Unknown time  . sodium chloride 1 g tablet Take 1 tablet (1 g total) by mouth 2 (two) times daily with a meal. 8 tablet 0 03/01/2016 at Unknown time  . traMADol (ULTRAM) 50 MG tablet Take 50 mg by mouth daily as needed for moderate pain.    Past Week at Unknown time  . warfarin (COUMADIN) 2.5 MG tablet Take as directed by [facility]. (Patient taking differently: Take 3.75 mg by mouth daily at 6 PM. Take as directed by [facility] Clinic.) 30 tablet 1 03/01/2016 at 1800  . Water For Irrigation, Sterile (FREE WATER) SOLN Place 230 mLs into feeding tube 3 (three) times daily.   03/01/2016 at Unknown time   Scheduled:  . [START ON 03/03/2016] amiodarone  200 mg Oral Daily  . atorvastatin  10 mg Oral q1800  . cycloSPORINE  1 drop Both Eyes BID  . [START ON 03/03/2016] diltiazem  120 mg Oral Daily  . [START ON 03/03/2016] fluticasone  2 spray Each Nare Daily  . furosemide  40 mg Intravenous Q6H  . gabapentin  300 mg Oral TID  .  hydrALAZINE  50 mg Oral TID  . [START ON 03/03/2016] insulin aspart  0-9 Units Subcutaneous TID WC  . [START ON 03/03/2016] isosorbide mononitrate  30 mg Oral Daily  . latanoprost  1 drop Both Eyes QHS  . [START ON 03/03/2016] levothyroxine  137 mcg Oral QAC breakfast  . [START ON 03/03/2016] loratadine  10 mg Oral Daily  . mometasone-formoterol  2 puff Inhalation BID  . pantoprazole  40 mg Oral BID  . polyvinyl alcohol  1 drop Both Eyes QID  . sertraline  100 mg Oral Daily  . sodium chloride flush  3 mL Intravenous Q12H    Assessment: 82yo female admitted after outpt cards OV for acute on chronic CHF and resulting acute respiratory failure, to continue Coumadin for Afib/Aflutter, in Aflutter today; current INR below goal w/ last dose of Coumadin 11/29.  Goal of Therapy:  INR 2-3   Plan:  Will give boosted Coumadin dose of 4mg  x1 tonight and monitor INR for dose adjustments.  Wynona Neat, PharmD, BCPS  03/02/2016,11:12 PM

## 2016-03-02 NOTE — H&P (Signed)
Expand All Collapse All   [] Hide copied text    H&P Admission Note  Date:  03/02/2016   ID:  Shannon [name], DOB 01/11/1924, MRN [id number]  PCP:  Kirt Boys, DO       Cardiologist:  Dr Delton See Primary Electrophysiologist: Tobias Alexander, MD              Chief complaint: Shortness of breath, orthopnea, lower extremity edema, weight gain, cough.   History of Present Illness: Shannon [name] is a 82 y.o. female who presents today for an urgent visit as her daughter told her yesterday with concern of 8 pounds weight gain in 2 days, shortness of breath, orthopnea and worsening lower extremity edema. This has started a few days ago when she noticed worsening shortness of breath now at rest, also paroxysmal nocturnal dyspnea as well as worsening lower extremity edema. She has noticed 5 pound weight increase overnight from 147-152 pounds. The patient states that she has been coughing a lot in the last few days with sputum of greenish color, she denies any fever or chills. She denies palpitations or syncope. Denies chest pain.  This is a very pleasant elderly female with history of persistent atrial fibrillation on amiodarone, bradycardia, CKD stage III,hypothyroidism,non-obstructive coronary artery disease, type 2 diabetes, hypertension, chronic diastolic CHF, stroke, cerebrovascular disease, COPD, asthma, gastric outlet obstruction s/p prior J tube, anemia. She has had multiple recent hospitalizations, she was admitted in August with bradycardia and weakness with HRs in the 30s-40s. Her labetololwas discontinued and amiodarone was reduced from 200 mg daily to 100 mg daily. She was seen in the office 01/24/16 extremely SOB with rapid atrial flutter and hypoxia. She was admitted to [facility] and upon arrival she required Bipap and IV Lasix with rapid improvement with brisk UOP. She converted to NSR on amiodarone drip. She was also noted to have a LLE DVT. Her Eliquis was held and she was  started on IV heparin. Hematology saw her and recommended Xarelto but with her GFR <30, Coumadin was started. Her amiodarone was increased to 200mg  BID x 2 weeks with plans to decrease to 200mg  daily as OP if remaining in NSR. IR also saw her for J tube obstruction. 2D Echo 01/27/16 showed EF 55-60%, grade 1 DD, mild AI/MR, mod LAE. Unfortunately she was re-admitted 11/6-11/11 with worsening SOB and chest pain with fever, leukocytosis, tachycardia, AKI on CKD, and was treated for sepsis due to UTI. Her Coumadin was held due to supratherapeutic range. EKG on 02/07/16 showed atrial flutter. Last labs during that adm showed Hgb 9.5, Na 133, BUN 36, Cr 1.17, INR 3.27.      Past Medical History:  Diagnosis Date  . Anemia    previous blood transfusions  . Arthritis    "all over"  . Asthma   . Bradycardia    requiring previous d/c of BB and reduction of amiodarone  . CAD (coronary artery disease)    nonobstructive per notes  . Chronic diastolic CHF (congestive heart failure) (HCC)   . CKD (chronic kidney disease), stage III   . Complication of blood transfusion    "got the wrong blood type at [facility] in ~ 2015; no adverse reaction that we are aware of"/daughter, [name] (01/27/2016)  . COPD (chronic obstructive pulmonary disease) (HCC)   . Depression    "light case"  . DVT (deep venous thrombosis) (HCC) 01/2016   a. LLE DVT 01/2016 - switched from Eliquis to Coumadin.  . Gastric stenosis  a. s/p stomach tube  . GERD (gastroesophageal reflux disease)   . History of blood transfusion    "several" (01/27/2016)  . History of stomach ulcers   . Hyperlipidemia   . Hypertension   . Hypothyroidism   . Paraesophageal hernia   . Perforated gastric ulcer (HCC)   . Seasonal allergies   . SIADH (syndrome of inappropriate ADH production) (HCC)    [name] 01/10/2015  . Small bowel obstruction    "I don't know how many" (01/11/2015)  . Stroke (HCC)    "light one"   . Type II diabetes mellitus (HCC)    "related to prednisone use  for > 20 yr; once predinose stopped; no more DM RX" (01/27/2016)  . Ventral hernia with bowel obstruction         Past Surgical History:  Procedure Laterality Date  . CATARACT EXTRACTION W/ INTRAOCULAR LENS  IMPLANT, BILATERAL    . CHOLECYSTECTOMY OPEN    . COLECTOMY    . ESOPHAGOGASTRODUODENOSCOPY N/A 01/19/2014   Procedure: ESOPHAGOGASTRODUODENOSCOPY (EGD);  Surgeon: Hilarie Fredrickson, MD;  Location: [facility];  Service: Endoscopy;  Laterality: N/A;  . ESOPHAGOGASTRODUODENOSCOPY N/A 01/20/2014   Procedure: ESOPHAGOGASTRODUODENOSCOPY (EGD);  Surgeon: Hilarie Fredrickson, MD;  Location: [facility];  Service: Endoscopy;  Laterality: N/A;  . ESOPHAGOGASTRODUODENOSCOPY N/A 03/19/2014   Procedure: ESOPHAGOGASTRODUODENOSCOPY (EGD);  Surgeon: Rachael Fee, MD;  Location: [facility];  Service: Endoscopy;  Laterality: N/A;  . ESOPHAGOGASTRODUODENOSCOPY N/A 07/08/2015   Procedure: ESOPHAGOGASTRODUODENOSCOPY (EGD);  Surgeon: Sherrilyn Rist, MD;  Location: [facility] ENDOSCOPY;  Service: Endoscopy;  Laterality: N/A;  . ESOPHAGOGASTRODUODENOSCOPY (EGD) WITH PROPOFOL N/A 09/15/2015   Procedure: ESOPHAGOGASTRODUODENOSCOPY (EGD) WITH PROPOFOL;  Surgeon: Ruffin Frederick, MD;  Location: [facility] ENDOSCOPY;  Service: Gastroenterology;  Laterality: N/A;  . GASTROJEJUNOSTOMY     hx/notes 01/10/2015  . GASTROJEJUNOSTOMY N/A 09/23/2015   Procedure: OPEN GASTROJEJUNOSTOMY TUBE PLACEMENT;  Surgeon: De Blanch Kinsinger, MD;  Location: [facility] ORS;  Service: General;  Laterality: N/A;  . GLAUCOMA SURGERY Bilateral   . HERNIA REPAIR  2015  . IR GENERIC HISTORICAL  01/07/2016   IR GJ TUBE CHANGE 01/07/2016 Malachy Moan, MD [facility]  . IR GENERIC HISTORICAL  01/27/2016   IR MECH REMOV OBSTRUC MAT ANY COLON TUBE W/FLUORO 01/27/2016 Richarda Overlie, MD [facility]  . IR GENERIC HISTORICAL  02/07/2016   IR PATIENT EVAL TECH 0-60 MINS  Darrell K Allred, PA-C [facility]  . IR GENERIC HISTORICAL  02/08/2016   IR GJ TUBE CHANGE 02/08/2016 Berdine Dance, MD [facility]  . LAPAROTOMY N/A 01/20/2015   Procedure: EXPLORATORY LAPAROTOMY;  Surgeon: Abigail Miyamoto, MD;  Location[facility];  Service: General;  Laterality: N/A;  . LYSIS OF ADHESION N/A 01/20/2015   Procedure: LYSIS OF ADHESIONS < 1 HOUR;  Surgeon: Abigail Miyamoto, MD;  Location: [facility] OR;  Service: General;  Laterality: N/A;  . TONSILLECTOMY    . TUBAL LIGATION    . VENTRAL HERNIA REPAIR  2015   incarcerated ventral hernia ([facility] 09/2013)/notes 01/10/2015           Current Outpatient Prescriptions  Medication Sig Dispense Refill  . acetaminophen (TYLENOL) 325 MG tablet Take 650 mg by mouth 2 (two) times daily as needed for mild pain.     [name] ADVAIR DISKUS 250-50 MCG/DOSE AEPB INHALE 1 PUFF INTO THE LUNGS 2 (TWO) TIMES DAILY AS NEEDED FOR SHORTNESS OF BREATH  3  . AMINO ACIDS-PROTEIN HYDROLYS PO Take 30 mLs by mouth 2 (two) times  daily.    . amiodarone (PACERONE) 200 MG tablet Take 200 mg by mouth daily.    [name] amLODipine (NORVASC) 10 MG tablet TAKE 1 TABLET BY MOUTH DAILY FOR HYPERTENSION 90 tablet 1  . atorvastatin (LIPITOR) 10 MG tablet Take 10 mg by mouth daily.    [name] b complex vitamins tablet Take 1 tablet by mouth daily.     . bisacodyl (DULCOLAX) 10 MG suppository Place 1 suppository (10 mg total) rectally daily as needed for moderate constipation. 12 suppository 0  . calcium-vitamin D (OSCAL WITH D) 500-200 MG-UNIT per tablet Take 1 tablet by mouth daily with breakfast.     . cephALEXin (KEFLEX) 500 MG capsule     . cetirizine (ZYRTEC) 10 MG tablet Take 10 mg by mouth daily as needed for allergies or rhinitis.    . chlorhexidine (PERIDEX) 0.12 % solution Use as directed 15 mLs in the mouth or throat 2 (two) times daily.    . diclofenac sodium (VOLTAREN) 1 % GEL Apply 2 g topically daily as needed (for pain).     [name] diltiazem  (CARDIZEM CD) 120 MG 24 hr capsule Take 1 capsule (120 mg total) by mouth daily. 90 capsule 3  . feeding supplement (BOOST / RESOURCE BREEZE) LIQD Take 1 Container by mouth daily.    . fluticasone (FLONASE) 50 MCG/ACT nasal spray Place 2 sprays into both nostrils daily. 16 g 3  . gabapentin (NEURONTIN) 300 MG capsule Take 1 capsule (300 mg total) by mouth 3 (three) times daily. 90 capsule 12  . hydrALAZINE (APRESOLINE) 50 MG tablet Take 1 tablet (50 mg total) by mouth 3 (three) times daily. 120 tablet 0  . HYDROcodone-acetaminophen (NORCO) 5-325 MG tablet Take 1 tablet by mouth every 6 (six) hours as needed for moderate pain. 30 tablet 0  . ipratropium-albuterol (DUONEB) 0.5-2.5 (3) MG/3ML SOLN Take 3 mLs by nebulization every 6 (six) hours as needed (sob). Use 3 times daily x 4 days then every 6 hours as needed. 360 mL 4  . isosorbide mononitrate (IMDUR) 30 MG 24 hr tablet TAKE 1 TABLET (30 MG TOTAL) BY MOUTH DAILY.  2  . latanoprost (XALATAN) 0.005 % ophthalmic solution Place 1 drop into both eyes at bedtime. 2.5 mL 12  . levalbuterol (XOPENEX HFA) 45 MCG/ACT inhaler Inhale 2 puffs into the lungs every 6 (six) hours as needed for wheezing or shortness of breath. 1 Inhaler 12  . levothyroxine (SYNTHROID, LEVOTHROID) 137 MCG tablet Take 1 tablet (137 mcg total) by mouth daily before breakfast. 30 tablet 2  . Maltodextrin-Xanthan Gum (RESOURCE THICKENUP CLEAR) POWD Take 120 g by mouth as needed (use to thicken liquids.). 20 Can 0  . Multiple Vitamin (MULTIVITAMIN WITH MINERALS) TABS tablet Take 1 tablet by mouth daily.    . Nutritional Supplements (FEEDING SUPPLEMENT, OSMOLITE 1.5 CAL,) LIQD Place 1,000 mLs into feeding tube daily. 1000 mL 3  . ondansetron (ZOFRAN) 4 MG tablet Take 1 tablet (4 mg total) by mouth every 8 (eight) hours as needed for nausea or vomiting. 20 tablet 0  . pantoprazole (PROTONIX) 40 MG tablet TAKE 1 TABLET BY MOUTH TWICE A DAY 60 tablet 5  . Polyethyl Glycol-Propyl  Glycol 0.4-0.3 % SOLN Place 1 drop into both eyes 4 (four) times daily.     . polyethylene glycol powder (GLYCOLAX/MIRALAX) powder MIX 17GRAMS IN 8 OUNCES OF LIQUID AND DRINK TWICE DAILY AS NEEDED 3162 g 1  . RESTASIS 0.05 % ophthalmic emulsion USE 1 DROP INTO BOTH  EYES TWICE DAILY 60 mL 0  . sertraline (ZOLOFT) 100 MG tablet Take one tablet by mouth once daily for depression 90 tablet 1  . simethicone (MYLICON) 80 MG chewable tablet Chew 80 mg by mouth 4 (four) times daily as needed for flatulence.    . sodium chloride 1 g tablet Take 1 tablet (1 g total) by mouth 2 (two) times daily with a meal. 8 tablet 0  . traMADol (ULTRAM) 50 MG tablet Take 50 mg by mouth daily as needed for moderate pain.     [name] warfarin (COUMADIN) 2.5 MG tablet Take as directed by [facility]. 30 tablet 1  . Water For Irrigation, Sterile (FREE WATER) SOLN Place 230 mLs into feeding tube 3 (three) times daily.     No current facility-administered medications for this visit.     Allergies:   Penicillins   Social History:  The patient  reports that she has never smoked. She has quit using smokeless tobacco. Her smokeless tobacco use included Snuff. She reports that she does not drink alcohol or use drugs.   Family History:  The patient's  family history includes Diabetes in her brother; Hypertension in her mother; Stroke in her mother.   ROS:  Please see the history of present illness.   All other systems are reviewed and negative.   PHYSICAL EXAM: VS:  BP 140/60   Pulse 74   Ht 5\' 6"  (1.676 m)   Wt 156 lb 9.6 oz (71 kg)   BMI 25.28 kg/m  , BMI Body mass index is 25.28 kg/m. GEN: elderly and frail, chronically ill, in mild respiratory distress  HEENT: normal  Neck: no JVD, carotid bruits, or masses Cardiac: tachycardic irregular Respiratory:  crackles bilaterally, labored work of breathing GI: soft, nontender, nondistended, + BS MS: diffuse muscle atrophy  Skin: warm and dry, +2 LE edema up  to the knees  Neuro:  Strength and sensation are intact Psych: euthymic mood, full affect  EKG:  EKG is ordered today. The ekg ordered today shows atrial flutter, V rate 85 bpm  Recent Labs: 11/11/2015: Magnesium 1.8 02/07/2016: ALT 41 02/14/2016: Brain Natriuretic Peptide 40.9; TSH 7.34 02/28/2016: BUN 31; Creatinine, Ser 1.30; Hemoglobin 9.2; Platelets 271; Potassium 4.7; Sodium 122   Lipid Panel  Labs (Brief)          Component Value Date/Time   CHOL 126 04/07/2015 1015   TRIG 107 04/07/2015 1015   HDL 69 04/07/2015 1015   CHOLHDL 1.8 04/07/2015 1015   CHOLHDL 1.9 12/24/2013 0456   VLDL 26 12/24/2013 0456   LDLCALC 36 04/07/2015 1015        Wt Readings from Last 3 Encounters:  03/02/16 156 lb 9.6 oz (71 kg)  02/29/16 147 lb (66.7 kg)  02/28/16 147 lb (66.7 kg)    TTE: 01/27/2016 - Left ventricle: The cavity size was normal. Wall thickness was normal. Systolic function was normal. The estimated ejection fraction was in the range of 55% to 60%. Wall motion was normal; there were no regional wall motion abnormalities. Doppler parameters are consistent with abnormal left ventricular relaxation (grade 1 diastolic dysfunction). - Aortic valve: There was mild regurgitation. - Mitral valve: There was mild regurgitation. - Left atrium: The atrium was moderately dilated.  Impressions: - Normal LV systolic function; grade 1 diastolic dysfunction; mild AI; mild MR; moderate LAE.  Other studies Reviewed: Additional studies/ records that were reviewed today include: hospital records, epic office notes, ekgs  Review of the above records  today demonstrates: as above   ASSESSMENT AND PLAN:  82 year old female with acute respiratory failure sec to acute on chronic diastolic CHF and possible respiratory inferction  1.  Acute on chronic diastolic CHF, 8 lbs weight gain in 2 days, no diuretics on board - start Lasix 40 iv Q6H, monitor Crea closely,  today 1.26, K 5.3.  2. Acute respiratory failure - sec to acute on chronic CHF, but possible respiratory infection, - we will obtain CXR and teat appropriately based on results, her SpO2 on room air was 96%. The patient has underlying asthma, this might be an acute exacerbation however no wheezing on physical exam.  3. Afib/ atrial flutter - in atrial flutter today. Rate controlled. Moderate LA enlargement On amiodarone to 200mg  daily Consider cardioversion if arrhythmia persists once acute issues are resolved. Continue coumadin  4. Prior bradycardia Sinus bradycardia previously noted on labetalol On low dose diltiazem Per Dr Johney Frame given advanced age and comorbidities, would avoid pacing long term  5. H/o GOO s/p g-tube  Her prognosis is poor.  Ultimately, a palliative approach should be considered.    Signed, Tobias Alexander, MD    [facility] [address] [place] [address] [phone] (office) [phone] (fax)    Electronically signed by Lars Masson, MD at 03/02/2016 1:32 PM

## 2016-03-02 NOTE — ED Provider Notes (Signed)
[facility] DEPT Provider Note   CSN: [id number] Arrival date & time: 03/02/16  1126     History   Chief Complaint Chief Complaint  Patient presents with  . Wheezing  . Cough    HPI Shannon [name] is a 82 y.o. female.  The history is provided by the patient and medical records.    82 y.o. F with hx of anemia, asthma, CAD, CHF, COPD, hx of DVT on coumadin, HTN, HLP, SIADH, DM2, presenting to the ED for shortness of breath.  Patient's daughter at the bedside and provides most of history. She reports patient was recently seen here for headache and was noted to be hyponatremic, has a history of same given her SIADH. States she was started on salt tablets and told to increase free fluid. Daughter reports since Monday she has gained approximately 6 pounds. She started out at 148 and now 154.  Daughter reports breathing has worsening as the week has gone on.  She continues having a wet cough, intermittently productive with white sputum.  No reported fever or chills.  No chest pain.  States they have tried inhalers at home without relief.  Was seen by cardiologist this morning and sent here for admission.  Past Medical History:  Diagnosis Date  . Anemia    previous blood transfusions  . Arthritis    "all over"  . Asthma   . Bradycardia    requiring previous d/c of BB and reduction of amiodarone  . CAD (coronary artery disease)    nonobstructive per notes  . Chronic diastolic CHF (congestive heart failure) ([place])   . CKD (chronic kidney disease), stage III   . Complication of blood transfusion    "got the wrong blood type at [facility] in ~ 2015; no adverse reaction that we are aware of"/daughter, [name] (01/27/2016)  . COPD (chronic obstructive pulmonary disease) ([place])   . Depression    "light case"  . DVT (deep venous thrombosis) ([place]) 01/2016   a. LLE DVT 01/2016 - switched from Eliquis to Coumadin.  . Gastric stenosis    a. s/p stomach tube  . GERD (gastroesophageal reflux  disease)   . History of blood transfusion    "several" (01/27/2016)  . History of stomach ulcers   . Hyperlipidemia   . Hypertension   . Hypothyroidism   . Paraesophageal hernia   . Perforated gastric ulcer ([place])   . Seasonal allergies   . SIADH (syndrome of inappropriate ADH production) ([place])    [name] 01/10/2015  . Small bowel obstruction    "I don't know how many" (01/11/2015)  . Stroke ([place])    "light one"  . Type II diabetes mellitus ([place])    "related to prednisone use  for > 20 yr; once predinose stopped; no more DM RX" (01/27/2016)  . Ventral hernia with bowel obstruction     Patient Active Problem List   Diagnosis Date Noted  . Encounter for therapeutic drug monitoring 02/16/2016  . Sepsis ([place]) 02/08/2016  . UTI (urinary tract infection) 02/08/2016  . Warfarin anticoagulation   . Chronic seasonal allergic rhinitis   . Sinus pain   . Acute renal insufficiency 01/26/2016  . Acute deep vein thrombosis (DVT) of left lower extremity ([place])   . Acute respiratory failure with hypoxia ([place])   . Respiratory alkalosis   . Atrial flutter ([place]) 01/24/2016  . Chronic diastolic CHF (congestive heart failure) ([place]) 01/24/2016  . Infection 01/24/2016  . Bilateral leg edema 12/29/2015  . Hypothyroidism  due to medication   . Weakness   . Pyloric stenosis   . Pressure ulcer 09/15/2015  . Hyperkalemia 09/12/2015  . Acute osteomyelitis of toe of right foot ([place])   . PVD (peripheral vascular disease) ([place])   . Adjustment disorder with anxiety 08/29/2015  . Diabetic ulcer of toe of right foot associated with type 2 diabetes mellitus, with necrosis of muscle ([place])   . Diverticulitis of large intestine without perforation or abscess without bleeding   . Colitis 08/26/2015  . Acute on chronic diastolic heart failure ([place]) 08/02/2015  . Esophageal candidiasis ([place])   . Dyspnea   . SOB (shortness of breath)   . Acute renal failure superimposed on stage 3 chronic kidney disease ([place])     . Encounter for long-term (current) use of other high-risk medications   . Respiratory distress 07/06/2015  . Dysphagia 07/05/2015  . Asthma with allergic rhinitis 06/04/2015  . Hypertensive heart disease 03/18/2015  . Malnutrition of moderate degree 01/28/2015  . Syncope 01/27/2015  . Fever, unspecified 01/22/2015  . Bradycardia 01/21/2015  . Hypotension 01/21/2015  . Palliative care encounter   . DNR (do not resuscitate) discussion   . Protein-calorie malnutrition, severe 01/12/2015  . Elevated troponin 01/12/2015  . HTN (hypertension), uncontrolled 01/12/2015  . AP (abdominal pain) 01/10/2015  . SBO (small bowel obstruction) 01/10/2015  . Cerebrovascular disease 07/14/2014  . Diabetic polyneuropathy associated with type 2 diabetes mellitus ([place]) 07/14/2014  . Hyperlipidemia 07/14/2014  . Gastric outlet obstruction 05/21/2014  . Unstable gait 04/29/2014  . Hyponatremia 03/23/2014  . Acute encephalopathy 03/22/2014  . Slurred speech   . Gastroparesis   . Slow transit constipation 02/03/2014  . Carotid stenosis 02/03/2014  . Demand ischemia ([place]) 01/21/2014  . Gastric bezoar 01/20/2014  . Anemia of chronic disease 01/18/2014  . CKD (chronic kidney disease) stage 3, GFR 30-59 ml/min 01/18/2014  . Depression 09/29/2013  . Essential hypertension, benign 09/22/2013  . GERD (gastroesophageal reflux disease) 09/22/2013  . Dyslipidemia 09/22/2013  . DM (diabetes mellitus) type II uncontrolled, periph vascular disorder ([place]) 09/22/2013  . A-fib ([place]) 09/22/2013  . CVA (cerebral vascular accident) ([place]) 09/22/2013  . Diabetic neuropathy ([place]) 09/22/2013  . Hypothyroidism 09/22/2013  . Chronic gastrojejunal ulcer with perforation ([place]) 09/07/2013  . Diabetes ([place]) 09/07/2013  . Accelerated hypertension 09/07/2013  . Systemic lupus erythematosus (SLE) inhibitor 09/07/2013  . Other specified abnormal immunological findings in serum 09/07/2013    Past Surgical History:   Procedure Laterality Date  . CATARACT EXTRACTION W/ INTRAOCULAR LENS  IMPLANT, BILATERAL    . CHOLECYSTECTOMY OPEN    . COLECTOMY    . ESOPHAGOGASTRODUODENOSCOPY N/A 01/19/2014   Procedure: ESOPHAGOGASTRODUODENOSCOPY (EGD);  Surgeon: Irene Shipper, MD;  Location: [facility];  Service: Endoscopy;  Laterality: N/A;  . ESOPHAGOGASTRODUODENOSCOPY N/A 01/20/2014   Procedure: ESOPHAGOGASTRODUODENOSCOPY (EGD);  Surgeon: Irene Shipper, MD;  Location: [facility];  Service: Endoscopy;  Laterality: N/A;  . ESOPHAGOGASTRODUODENOSCOPY N/A 03/19/2014   Procedure: ESOPHAGOGASTRODUODENOSCOPY (EGD);  Surgeon: Milus Banister, MD;  Location: [facility];  Service: Endoscopy;  Laterality: N/A;  . ESOPHAGOGASTRODUODENOSCOPY N/A 07/08/2015   Procedure: ESOPHAGOGASTRODUODENOSCOPY (EGD);  Surgeon: Doran Stabler, MD;  Location[facility];  Service: Endoscopy;  Laterality: N/A;  . ESOPHAGOGASTRODUODENOSCOPY (EGD) WITH PROPOFOL N/A 09/15/2015   Procedure: ESOPHAGOGASTRODUODENOSCOPY (EGD) WITH PROPOFOL;  Surgeon: Manus Gunning, MD;  Location: [facility] ENDOSCOPY;  Service: Gastroenterology;  Laterality: N/A;  . GASTROJEJUNOSTOMY     hx/notes 01/10/2015  . GASTROJEJUNOSTOMY N/A 09/23/2015  Procedure: OPEN GASTROJEJUNOSTOMY TUBE PLACEMENT;  Surgeon: Arta Bruce Kinsinger, MD;  Location: [facility] ORS;  Service: General;  Laterality: N/A;  . GLAUCOMA SURGERY Bilateral   . HERNIA REPAIR  2015  . IR GENERIC HISTORICAL  01/07/2016   IR GJ TUBE CHANGE 01/07/2016 Jacqulynn Cadet, MD [facility]  . IR GENERIC HISTORICAL  01/27/2016   IR MECH REMOV OBSTRUC MAT ANY COLON TUBE W/FLUORO 01/27/2016 Markus Daft, MD [facility]  . IR GENERIC HISTORICAL  02/07/2016   IR PATIENT EVAL TECH 0-60 MINS Darrell K Allred, PA-C [facility]  . IR GENERIC HISTORICAL  02/08/2016   IR GJ TUBE CHANGE 02/08/2016 Greggory Keen, MD [facility]  . LAPAROTOMY N/A 01/20/2015   Procedure: EXPLORATORY LAPAROTOMY;  Surgeon: Coralie Keens, MD;   Location: [facility];  Service: General;  Laterality: N/A;  . LYSIS OF ADHESION N/A 01/20/2015   Procedure: LYSIS OF ADHESIONS < 1 HOUR;  Surgeon: Coralie Keens, MD;  Location: [facility];  Service: General;  Laterality: N/A;  . TONSILLECTOMY    . TUBAL LIGATION    . VENTRAL HERNIA REPAIR  2015   incarcerated ventral hernia ([facility] 09/2013)/notes 01/10/2015    OB History    No data available       Home Medications    Prior to Admission medications   Medication Sig Start Date End Date Taking? Authorizing Provider  acetaminophen (TYLENOL) 325 MG tablet Take 650 mg by mouth 2 (two) times daily as needed for mild pain.     Historical Provider, MD  ADVAIR DISKUS 250-50 MCG/DOSE AEPB INHALE 1 PUFF INTO THE LUNGS 2 (TWO) TIMES DAILY AS NEEDED FOR SHORTNESS OF BREATH 12/01/14   Historical Provider, MD  AMINO ACIDS-PROTEIN HYDROLYS PO Take 30 mLs by mouth 2 (two) times daily.    Historical Provider, MD  amiodarone (PACERONE) 200 MG tablet Take 200 mg by mouth daily.    Historical Provider, MD  amLODipine (NORVASC) 10 MG tablet TAKE 1 TABLET BY MOUTH DAILY FOR HYPERTENSION 02/21/16   Gildardo Cranker, DO  atorvastatin (LIPITOR) 10 MG tablet Take 10 mg by mouth daily.    Historical Provider, MD  b complex vitamins tablet Take 1 tablet by mouth daily.     Historical Provider, MD  bisacodyl (DULCOLAX) 10 MG suppository Place 1 suppository (10 mg total) rectally daily as needed for moderate constipation. 09/30/15   Debbe Odea, MD  calcium-vitamin D (OSCAL WITH D) 500-200 MG-UNIT per tablet Take 1 tablet by mouth daily with breakfast.     Historical Provider, MD  cephALEXin (KEFLEX) 500 MG capsule  02/12/16   Historical Provider, MD  cetirizine (ZYRTEC) 10 MG tablet Take 10 mg by mouth daily as needed for allergies or rhinitis.    Historical Provider, MD  chlorhexidine (PERIDEX) 0.12 % solution Use as directed 15 mLs in the mouth or throat 2 (two) times daily.    Historical Provider, MD  diclofenac sodium  (VOLTAREN) 1 % GEL Apply 2 g topically daily as needed (for pain).     Historical Provider, MD  diltiazem (CARDIZEM CD) 120 MG 24 hr capsule Take 1 capsule (120 mg total) by mouth daily. 02/23/16 05/23/16  Thompson Grayer, MD  feeding supplement (BOOST / RESOURCE BREEZE) LIQD Take 1 Container by mouth daily. 11/11/15   Eugenie Filler, MD  fluticasone (FLONASE) 50 MCG/ACT nasal spray Place 2 sprays into both nostrils daily. 08/07/14   Gildardo Cranker, DO  gabapentin (NEURONTIN) 300 MG capsule Take 1 capsule (300 mg total) by  mouth 3 (three) times daily. 02/02/16   Arbutus Leas, NP  hydrALAZINE (APRESOLINE) 50 MG tablet Take 1 tablet (50 mg total) by mouth 3 (three) times daily. 11/11/15   Eugenie Filler, MD  HYDROcodone-acetaminophen (NORCO) 5-325 MG tablet Take 1 tablet by mouth every 6 (six) hours as needed for moderate pain. 02/18/16   Monica Carter, DO  ipratropium-albuterol (DUONEB) 0.5-2.5 (3) MG/3ML SOLN Take 3 mLs by nebulization every 6 (six) hours as needed (sob). Use 3 times daily x 4 days then every 6 hours as needed. 11/11/15   Eugenie Filler, MD  isosorbide mononitrate (IMDUR) 30 MG 24 hr tablet TAKE 1 TABLET (30 MG TOTAL) BY MOUTH DAILY. 06/22/15   Historical Provider, MD  latanoprost (XALATAN) 0.005 % ophthalmic solution Place 1 drop into both eyes at bedtime. 06/02/14   Blanchie Serve, MD  levalbuterol (XOPENEX HFA) 45 MCG/ACT inhaler Inhale 2 puffs into the lungs every 6 (six) hours as needed for wheezing or shortness of breath. 02/03/16   Gildardo Cranker, DO  levothyroxine (SYNTHROID, LEVOTHROID) 137 MCG tablet Take 1 tablet (137 mcg total) by mouth daily before breakfast. 12/27/15   Gildardo Cranker, DO  Maltodextrin-Xanthan Gum (RESOURCE THICKENUP CLEAR) POWD Take 120 g by mouth as needed (use to thicken liquids.). 11/11/15   Eugenie Filler, MD  Multiple Vitamin (MULTIVITAMIN WITH MINERALS) TABS tablet Take 1 tablet by mouth daily.    Historical Provider, MD  Nutritional Supplements (FEEDING  SUPPLEMENT, OSMOLITE 1.5 CAL,) LIQD Place 1,000 mLs into feeding tube daily. 09/30/15   Debbe Odea, MD  ondansetron (ZOFRAN) 4 MG tablet Take 1 tablet (4 mg total) by mouth every 8 (eight) hours as needed for nausea or vomiting. 09/07/15   [name], NP  pantoprazole (PROTONIX) 40 MG tablet TAKE 1 TABLET BY MOUTH TWICE A DAY 08/11/15   Gildardo Cranker, DO  Polyethyl Glycol-Propyl Glycol 0.4-0.3 % SOLN Place 1 drop into both eyes 4 (four) times daily.     Historical Provider, MD  polyethylene glycol powder (GLYCOLAX/MIRALAX) powder MIX 17GRAMS IN 8 OUNCES OF LIQUID AND DRINK TWICE DAILY AS NEEDED 09/13/15   Gildardo Cranker, DO  RESTASIS 0.05 % ophthalmic emulsion USE 1 DROP INTO BOTH EYES TWICE DAILY 11/01/15   Estill Dooms, MD  sertraline (ZOLOFT) 100 MG tablet Take one tablet by mouth once daily for depression 09/03/15   Gildardo Cranker, DO  simethicone (MYLICON) 80 MG chewable tablet Chew 80 mg by mouth 4 (four) times daily as needed for flatulence.    Historical Provider, MD  sodium chloride 1 g tablet Take 1 tablet (1 g total) by mouth 2 (two) times daily with a meal. 02/28/16   Orpah Greek, MD  traMADol (ULTRAM) 50 MG tablet Take 50 mg by mouth daily as needed for moderate pain.     Historical Provider, MD  warfarin (COUMADIN) 2.5 MG tablet Take as directed by [facility] Clinic. 02/16/16   Dorothy Spark, MD  Water For Irrigation, Sterile (FREE WATER) SOLN Place 230 mLs into feeding tube 3 (three) times daily.    Historical Provider, MD    Family History Family History  Problem Relation Age of Onset  . Stroke Mother   . Hypertension Mother   . Diabetes Brother   . Heart attack Neg Hx     Social History Social History  Substance Use Topics  . Smoking status: Never Smoker  . Smokeless tobacco: Former Systems developer    Types: Snuff  Comment: "used snuff in my younger days"  . Alcohol use No     Allergies   Penicillins   Review of Systems Review of Systems  Respiratory:  Positive for cough and shortness of breath.   Cardiovascular: Positive for leg swelling.  All other systems reviewed and are negative.    Physical Exam Updated Vital Signs BP 145/68 (BP Location: Right Arm)   Pulse 74   Temp 99.4 F (37.4 C) (Rectal)   Resp 20   Ht 5\' 6"  (1.676 m)   Wt 71 kg   SpO2 99%   BMI 25.28 kg/m   Physical Exam  Constitutional: She is oriented to person, place, and time. She appears well-developed and well-nourished.  HENT:  Head: Normocephalic and atraumatic.  Mouth/Throat: Oropharynx is clear and moist.  Eyes: Conjunctivae and EOM are normal. Pupils are equal, round, and reactive to light.  Neck: Normal range of motion.  Cardiovascular: Normal rate, regular rhythm and normal heart sounds.   Pulmonary/Chest: Effort normal. She has rales.  Increased work of breathing, rales bilaterally at bases  Abdominal: Soft. Bowel sounds are normal.  Musculoskeletal: Normal range of motion.  2+ pitting edema of the legs, no calf swelling or tenderness  Neurological: She is alert and oriented to person, place, and time.  Skin: Skin is warm and dry.  Psychiatric: She has a normal mood and affect.  Nursing note and vitals reviewed.    ED Treatments / Results  Labs (all labs ordered are listed, but only abnormal results are displayed) Labs Reviewed  COMPREHENSIVE METABOLIC PANEL - Abnormal; Notable for the following:       Result Value   Sodium 121 (*)    Potassium 5.3 (*)    Chloride 89 (*)    Glucose, Bld 157 (*)    BUN 34 (*)    Creatinine, Ser 1.26 (*)    Total Protein 6.4 (*)    Albumin 3.4 (*)    GFR calc non Af Amer 36 (*)    GFR calc Af Amer 42 (*)    All other components within normal limits  CBC WITH DIFFERENTIAL/PLATELET - Abnormal; Notable for the following:    RBC 2.84 (*)    Hemoglobin 8.2 (*)    HCT 23.9 (*)    RDW 17.4 (*)    Lymphs Abs 0.4 (*)    All other components within normal limits  BRAIN NATRIURETIC PEPTIDE - Abnormal;  Notable for the following:    B Natriuretic Peptide 515.7 (*)    All other components within normal limits  PROTIME-INR - Abnormal; Notable for the following:    Prothrombin Time 19.9 (*)    All other components within normal limits  I-STAT CG4 LACTIC ACID, ED  I-STAT TROPOININ, ED  I-STAT CG4 LACTIC ACID, ED    EKG  EKG Interpretation  Date/Time:  Thursday March 02 2016 11:37:09 EST Ventricular Rate:  72 PR Interval:    QRS Duration: 78 QT Interval:  376 QTC Calculation: 411 R Axis:   84 Text Interpretation:  Atrial flutter with variable A-V block Nonspecific T wave abnormality Confirmed by Ashok Cordia  MD, Lennette Bihari ([id number]) on 03/02/2016 12:05:15 PM       Radiology Dg Chest 2 View  Result Date: 03/02/2016 CLINICAL DATA:  Shortness of Breath EXAM: CHEST  2 VIEW COMPARISON:  02/07/2016 FINDINGS: Cardiomediastinal silhouette is stable. There is chronic elevation of the right hemidiaphragm. There is streaky left base retrocardiac atelectasis or infiltrate best seen on lateral view.  Mild compression deformity lower thoracic spine of indeterminate age. Clinical correlation is necessary. IMPRESSION: There is streaky left base retrocardiac atelectasis or infiltrate best seen on lateral view. Mild compression deformity lower thoracic spine of indeterminate age. Clinical correlation is necessary. Electronically Signed   By: Lahoma Crocker M.D.   On: 03/02/2016 14:00    Procedures Procedures (including critical care time)  Medications Ordered in ED Medications  albuterol (PROVENTIL) (2.5 MG/3ML) 0.083% nebulizer solution (not administered)  methylPREDNISolone sodium succinate (SOLU-MEDROL) 125 mg/2 mL injection 125 mg (not administered)  albuterol (PROVENTIL) (2.5 MG/3ML) 0.083% nebulizer solution 5 mg (not administered)  albuterol (PROVENTIL) (2.5 MG/3ML) 0.083% nebulizer solution 5 mg (5 mg Nebulization Given 03/02/16 1147)     Initial Impression / Assessment and Plan / ED Course  I have  reviewed the triage vital signs and the nursing notes.  Pertinent labs & imaging results that were available during my care of the patient were reviewed by me and considered in my medical decision making (see chart for details).  Clinical Course    82 year old female here with shortness of breath. Was recently started on salt tablets and increase fluids due to hyponatremia. She does have a history of SIADH. Patient has had several pound weight gain over the past week. She appears fluid overloaded on exam. Her work of breathing is increased and she does have a wet cough on exam. Concern for mixed clinical picture of CHF exacerbation as well as possible infection/CAP.  Work-up here again with noted hyponatremia.  Trop is normal.  BNP elevated at 515.  Normal lactate, normal WBC count.  CXR with questionable infiltrate vs atelectasis on lateral view.  Dr. Meda Coffee who evaluated patient this morning in clinic has already evaluated patient in the ED and placed admission orders.  Final Clinical Impressions(s) / ED Diagnoses   Final diagnoses:  Acute on chronic congestive heart failure, unspecified congestive heart failure type ([place])  Shortness of breath  Cough    New Prescriptions New Prescriptions   No medications on file     Larene Pickett, PA-C 03/02/16 [place], MD 03/03/16 1655

## 2016-03-02 NOTE — Patient Instructions (Addendum)
Medication Instructions:   Your physician recommends that you continue on your current medications as directed. Please refer to the Current Medication list given to you today.    Follow-Up:  --BASED ON [place] ADVISED THAT YOU REPORT TO [facility] ER VIA PRIVATE VEHICLE FOR ACUTE HEART FAILURE AND POSSIBLE PNEUMONIA.   [facility] WAS CONTACTED AND DR [facility] ORDERS IN AND WRITE AN H&P,  FOR YOU TO BE ADMITTED TO A TELEMETRY FLOOR.    PER BED CONTROL AT [facility]--THE PATIENT WILL NEED TO DEFER TO THE ER FOR INITIAL WORK-UP, WHILE WAITING TO BE ADMITTED TO A TELEMETRY BED.

## 2016-03-02 NOTE — ED Notes (Signed)
Pt being transported to x-ray

## 2016-03-02 NOTE — ED Triage Notes (Signed)
Pt from PCP with request for admission for pt, was advised by bed control to send pt to ER, for CHF and rule out pneumonia.  Pt reports wheezing, and yellow productive cough x 3 days.  To be seen by Dr Meda Coffee.  A&O.

## 2016-03-02 NOTE — Progress Notes (Signed)
H&P Admission Note  Date:  03/02/2016   ID:  Shannon [name], DOB 1923-09-10, MRN [id number]  PCP:  Kirt Boys, DO  Cardiologist:  Dr Delton See Primary Electrophysiologist: Tobias Alexander, MD    Chief complaint: Shortness of breath, orthopnea, lower extremity edema, weight gain, cough.   History of Present Illness: Shannon [name] is a 82 y.o. female who presents today for an urgent visit as her daughter told her yesterday with concern of 8 pounds weight gain in 2 days, shortness of breath, orthopnea and worsening lower extremity edema. This has started a few days ago when she noticed worsening shortness of breath now at rest, also paroxysmal nocturnal dyspnea as well as worsening lower extremity edema. She has noticed 5 pound weight increase overnight from 147-152 pounds. The patient states that she has been coughing a lot in the last few days with sputum of greenish color, she denies any fever or chills. She denies palpitations or syncope. Denies chest pain.  This is a very pleasant elderly female with history of persistent atrial fibrillation on amiodarone, bradycardia, CKD stage III, hypothyroidism,non-obstructive coronary artery disease, type 2 diabetes, hypertension, chronic diastolic CHF, stroke, cerebrovascular disease, COPD, asthma, gastric outlet obstruction s/p prior J tube, anemia. She has had multiple recent hospitalizations, she was admitted in August with bradycardia and weakness with HRs in the 30s-40s. Her labetololwas discontinued and amiodarone was reduced from 200 mg daily to 100 mg daily. She was seen in the office 01/24/16 extremely SOB with rapid atrial flutter and hypoxia. She was admitted to [facility] and upon arrival she required Bipap and IV Lasix with rapid improvement with brisk UOP. She converted to NSR on amiodarone drip. She was also noted to have a LLE DVT. Her Eliquis was held and she was started on IV heparin. Hematology saw her and recommended Xarelto but  with her GFR < 30, Coumadin was started. Her amiodarone was increased to 200mg  BID x 2 weeks with plans to decrease to 200mg  daily as OP if remaining in NSR. IR also saw her for J tube obstruction. 2D Echo 01/27/16 showed EF 55-60%, grade 1 DD, mild AI/MR, mod LAE. Unfortunately she was re-admitted 11/6-11/11 with worsening SOB and chest pain with fever, leukocytosis, tachycardia, AKI on CKD, and was treated for sepsis due to UTI. Her Coumadin was held due to supratherapeutic range. EKG on 02/07/16 showed atrial flutter. Last labs during that adm showed Hgb 9.5, Na 133, BUN 36, Cr 1.17, INR 3.27.  Past Medical History:  Diagnosis Date  . Anemia    previous blood transfusions  . Arthritis    "all over"  . Asthma   . Bradycardia    requiring previous d/c of BB and reduction of amiodarone  . CAD (coronary artery disease)    nonobstructive per notes  . Chronic diastolic CHF (congestive heart failure) (HCC)   . CKD (chronic kidney disease), stage III   . Complication of blood transfusion    "got the wrong blood type at [facility] in ~ 2015; no adverse reaction that we are aware of"/daughter, [name] (01/27/2016)  . COPD (chronic obstructive pulmonary disease) (HCC)   . Depression    "light case"  . DVT (deep venous thrombosis) (HCC) 01/2016   a. LLE DVT 01/2016 - switched from Eliquis to Coumadin.  . Gastric stenosis    a. s/p stomach tube  . GERD (gastroesophageal reflux disease)   . History of blood transfusion    "several" (01/27/2016)  . History of  stomach ulcers   . Hyperlipidemia   . Hypertension   . Hypothyroidism   . Paraesophageal hernia   . Perforated gastric ulcer (HCC)   . Seasonal allergies   . SIADH (syndrome of inappropriate ADH production) (HCC)    [name] 01/10/2015  . Small bowel obstruction    "I don't know how many" (01/11/2015)  . Stroke (HCC)    "light one"  . Type II diabetes mellitus (HCC)    "related to prednisone use  for > 20 yr; once predinose stopped; no  more DM RX" (01/27/2016)  . Ventral hernia with bowel obstruction    Past Surgical History:  Procedure Laterality Date  . CATARACT EXTRACTION W/ INTRAOCULAR LENS  IMPLANT, BILATERAL    . CHOLECYSTECTOMY OPEN    . COLECTOMY    . ESOPHAGOGASTRODUODENOSCOPY N/A 01/19/2014   Procedure: ESOPHAGOGASTRODUODENOSCOPY (EGD);  Surgeon: Hilarie Fredrickson, MD;  Location: [facility];  Service: Endoscopy;  Laterality: N/A;  . ESOPHAGOGASTRODUODENOSCOPY N/A 01/20/2014   Procedure: ESOPHAGOGASTRODUODENOSCOPY (EGD);  Surgeon: Hilarie Fredrickson, MD;  Location: [facility];  Service: Endoscopy;  Laterality: N/A;  . ESOPHAGOGASTRODUODENOSCOPY N/A 03/19/2014   Procedure: ESOPHAGOGASTRODUODENOSCOPY (EGD);  Surgeon: Rachael Fee, MD;  Location: [facility];  Service: Endoscopy;  Laterality: N/A;  . ESOPHAGOGASTRODUODENOSCOPY N/A 07/08/2015   Procedure: ESOPHAGOGASTRODUODENOSCOPY (EGD);  Surgeon: Sherrilyn Rist, MD;  Location[facility] ENDOSCOPY;  Service: Endoscopy;  Laterality: N/A;  . ESOPHAGOGASTRODUODENOSCOPY (EGD) WITH PROPOFOL N/A 09/15/2015   Procedure: ESOPHAGOGASTRODUODENOSCOPY (EGD) WITH PROPOFOL;  Surgeon: Ruffin Frederick, MD;  Location: [facility] ENDOSCOPY;  Service: Gastroenterology;  Laterality: N/A;  . GASTROJEJUNOSTOMY     hx/notes 01/10/2015  . GASTROJEJUNOSTOMY N/A 09/23/2015   Procedure: OPEN GASTROJEJUNOSTOMY TUBE PLACEMENT;  Surgeon: De Blanch Kinsinger, MD;  Location: [facility] ORS;  Service: General;  Laterality: N/A;  . GLAUCOMA SURGERY Bilateral   . HERNIA REPAIR  2015  . IR GENERIC HISTORICAL  01/07/2016   IR GJ TUBE CHANGE 01/07/2016 Malachy Moan, MD [facility]  . IR GENERIC HISTORICAL  01/27/2016   IR MECH REMOV OBSTRUC MAT ANY COLON TUBE W/FLUORO 01/27/2016 Richarda Overlie, MD [facility]  . IR GENERIC HISTORICAL  02/07/2016   IR PATIENT EVAL TECH 0-60 MINS Darrell K Allred, PA-C [facility]  . IR GENERIC HISTORICAL  02/08/2016   IR GJ TUBE CHANGE 02/08/2016 Berdine Dance, MD [facility]  .  LAPAROTOMY N/A 01/20/2015   Procedure: EXPLORATORY LAPAROTOMY;  Surgeon: Abigail Miyamoto, MD;  [facility];  Service: General;  Laterality: N/A;  . LYSIS OF ADHESION N/A 01/20/2015   Procedure: LYSIS OF ADHESIONS < 1 HOUR;  Surgeon: Abigail Miyamoto, MD;  Location: [facility] OR;  Service: General;  Laterality: N/A;  . TONSILLECTOMY    . TUBAL LIGATION    . VENTRAL HERNIA REPAIR  2015   incarcerated ventral hernia ([facility] 09/2013)/notes 01/10/2015     Current Outpatient Prescriptions  Medication Sig Dispense Refill  . acetaminophen (TYLENOL) 325 MG tablet Take 650 mg by mouth 2 (two) times daily as needed for mild pain.     [name] ADVAIR DISKUS 250-50 MCG/DOSE AEPB INHALE 1 PUFF INTO THE LUNGS 2 (TWO) TIMES DAILY AS NEEDED FOR SHORTNESS OF BREATH  3  . AMINO ACIDS-PROTEIN HYDROLYS PO Take 30 mLs by mouth 2 (two) times daily.    [name] amiodarone (PACERONE) 200 MG tablet Take 200 mg by mouth daily.    [name] amLODipine (NORVASC) 10 MG tablet TAKE 1 TABLET BY MOUTH DAILY FOR HYPERTENSION 90 tablet 1  .  atorvastatin (LIPITOR) 10 MG tablet Take 10 mg by mouth daily.    [name] b complex vitamins tablet Take 1 tablet by mouth daily.     . bisacodyl (DULCOLAX) 10 MG suppository Place 1 suppository (10 mg total) rectally daily as needed for moderate constipation. 12 suppository 0  . calcium-vitamin D (OSCAL WITH D) 500-200 MG-UNIT per tablet Take 1 tablet by mouth daily with breakfast.     . cephALEXin (KEFLEX) 500 MG capsule     . cetirizine (ZYRTEC) 10 MG tablet Take 10 mg by mouth daily as needed for allergies or rhinitis.    . chlorhexidine (PERIDEX) 0.12 % solution Use as directed 15 mLs in the mouth or throat 2 (two) times daily.    . diclofenac sodium (VOLTAREN) 1 % GEL Apply 2 g topically daily as needed (for pain).     [name] diltiazem (CARDIZEM CD) 120 MG 24 hr capsule Take 1 capsule (120 mg total) by mouth daily. 90 capsule 3  . feeding supplement (BOOST / RESOURCE BREEZE) LIQD Take 1 Container by mouth daily.    .  fluticasone (FLONASE) 50 MCG/ACT nasal spray Place 2 sprays into both nostrils daily. 16 g 3  . gabapentin (NEURONTIN) 300 MG capsule Take 1 capsule (300 mg total) by mouth 3 (three) times daily. 90 capsule 12  . hydrALAZINE (APRESOLINE) 50 MG tablet Take 1 tablet (50 mg total) by mouth 3 (three) times daily. 120 tablet 0  . HYDROcodone-acetaminophen (NORCO) 5-325 MG tablet Take 1 tablet by mouth every 6 (six) hours as needed for moderate pain. 30 tablet 0  . ipratropium-albuterol (DUONEB) 0.5-2.5 (3) MG/3ML SOLN Take 3 mLs by nebulization every 6 (six) hours as needed (sob). Use 3 times daily x 4 days then every 6 hours as needed. 360 mL 4  . isosorbide mononitrate (IMDUR) 30 MG 24 hr tablet TAKE 1 TABLET (30 MG TOTAL) BY MOUTH DAILY.  2  . latanoprost (XALATAN) 0.005 % ophthalmic solution Place 1 drop into both eyes at bedtime. 2.5 mL 12  . levalbuterol (XOPENEX HFA) 45 MCG/ACT inhaler Inhale 2 puffs into the lungs every 6 (six) hours as needed for wheezing or shortness of breath. 1 Inhaler 12  . levothyroxine (SYNTHROID, LEVOTHROID) 137 MCG tablet Take 1 tablet (137 mcg total) by mouth daily before breakfast. 30 tablet 2  . Maltodextrin-Xanthan Gum (RESOURCE THICKENUP CLEAR) POWD Take 120 g by mouth as needed (use to thicken liquids.). 20 Can 0  . Multiple Vitamin (MULTIVITAMIN WITH MINERALS) TABS tablet Take 1 tablet by mouth daily.    . Nutritional Supplements (FEEDING SUPPLEMENT, OSMOLITE 1.5 CAL,) LIQD Place 1,000 mLs into feeding tube daily. 1000 mL 3  . ondansetron (ZOFRAN) 4 MG tablet Take 1 tablet (4 mg total) by mouth every 8 (eight) hours as needed for nausea or vomiting. 20 tablet 0  . pantoprazole (PROTONIX) 40 MG tablet TAKE 1 TABLET BY MOUTH TWICE A DAY 60 tablet 5  . Polyethyl Glycol-Propyl Glycol 0.4-0.3 % SOLN Place 1 drop into both eyes 4 (four) times daily.     . polyethylene glycol powder (GLYCOLAX/MIRALAX) powder MIX 17GRAMS IN 8 OUNCES OF LIQUID AND DRINK TWICE DAILY AS NEEDED  3162 g 1  . RESTASIS 0.05 % ophthalmic emulsion USE 1 DROP INTO BOTH EYES TWICE DAILY 60 mL 0  . sertraline (ZOLOFT) 100 MG tablet Take one tablet by mouth once daily for depression 90 tablet 1  . simethicone (MYLICON) 80 MG chewable tablet Chew 80 mg by mouth  4 (four) times daily as needed for flatulence.    . sodium chloride 1 g tablet Take 1 tablet (1 g total) by mouth 2 (two) times daily with a meal. 8 tablet 0  . traMADol (ULTRAM) 50 MG tablet Take 50 mg by mouth daily as needed for moderate pain.     [name] warfarin (COUMADIN) 2.5 MG tablet Take as directed by [facility] Clinic. 30 tablet 1  . Water For Irrigation, Sterile (FREE WATER) SOLN Place 230 mLs into feeding tube 3 (three) times daily.     No current facility-administered medications for this visit.     Allergies:   Penicillins   Social History:  The patient  reports that she has never smoked. She has quit using smokeless tobacco. Her smokeless tobacco use included Snuff. She reports that she does not drink alcohol or use drugs.   Family History:  The patient's  family history includes Diabetes in her brother; Hypertension in her mother; Stroke in her mother.   ROS:  Please see the history of present illness.   All other systems are reviewed and negative.   PHYSICAL EXAM: VS:  BP 140/60   Pulse 74   Ht 5\' 6"  (1.676 m)   Wt 156 lb 9.6 oz (71 kg)   BMI 25.28 kg/m  , BMI Body mass index is 25.28 kg/m. GEN: elderly and frail, chronically ill, in mild respiratory distress  HEENT: normal  Neck: no JVD, carotid bruits, or masses Cardiac: tachycardic irregular Respiratory:  crackles bilaterally, labored work of breathing GI: soft, nontender, nondistended, + BS MS: diffuse muscle atrophy  Skin: warm and dry, +2 LE edema up to the knees  Neuro:  Strength and sensation are intact Psych: euthymic mood, full affect  EKG:  EKG is ordered today. The ekg ordered today shows atrial flutter, V rate 85 bpm  Recent Labs: 11/11/2015:  Magnesium 1.8 02/07/2016: ALT 41 02/14/2016: Brain Natriuretic Peptide 40.9; TSH 7.34 02/28/2016: BUN 31; Creatinine, Ser 1.30; Hemoglobin 9.2; Platelets 271; Potassium 4.7; Sodium 122   Lipid Panel     Component Value Date/Time   CHOL 126 04/07/2015 1015   TRIG 107 04/07/2015 1015   HDL 69 04/07/2015 1015   CHOLHDL 1.8 04/07/2015 1015   CHOLHDL 1.9 12/24/2013 0456   VLDL 26 12/24/2013 0456   LDLCALC 36 04/07/2015 1015   Wt Readings from Last 3 Encounters:  03/02/16 156 lb 9.6 oz (71 kg)  02/29/16 147 lb (66.7 kg)  02/28/16 147 lb (66.7 kg)    TTE: 01/27/2016 - Left ventricle: The cavity size was normal. Wall thickness was   normal. Systolic function was normal. The estimated ejection   fraction was in the range of 55% to 60%. Wall motion was normal;   there were no regional wall motion abnormalities. Doppler   parameters are consistent with abnormal left ventricular   relaxation (grade 1 diastolic dysfunction). - Aortic valve: There was mild regurgitation. - Mitral valve: There was mild regurgitation. - Left atrium: The atrium was moderately dilated.  Impressions: - Normal LV systolic function; grade 1 diastolic dysfunction; mild   AI; mild MR; moderate LAE.  Other studies Reviewed: Additional studies/ records that were reviewed today include: hospital records, epic office notes, ekgs  Review of the above records today demonstrates: as above   ASSESSMENT AND PLAN:  82 year old female with acute respiratory failure sec to acute on chronic diastolic CHF and possible respiratory inferction  1.  Acute on chronic diastolic CHF, 8 lbs  weight gain in 2 days, no diuretics on board - start Lasix 40 iv Q6H, monitor Crea closely, today 1.26, K 5.3.  2. Acute respiratory failure - sec to acute on chronic CHF, but possible respiratory infection, - we will obtain CXR and teat appropriately based on results, her SpO2 on room air was 96%. The patient has underlying asthma, this  might be an acute exacerbation however no wheezing on physical exam.  3. Afib/ atrial flutter - in atrial flutter today. Rate controlled. Moderate LA enlargement On amiodarone to 200mg  daily Consider cardioversion if arrhythmia persists once acute issues are resolved. Continue coumadin  4. Prior bradycardia Sinus bradycardia previously noted on labetalol On low dose diltiazem Per Dr Johney Frame given advanced age and comorbidities, would avoid pacing long term  5. H/o GOO s/p g-tube  Her prognosis is poor.  Ultimately, a palliative approach should be considered.    Signed, Tobias Alexander, MD    [facility] [address] [place] [address] [phone] (office) [phone] (fax)

## 2016-03-03 ENCOUNTER — Ambulatory Visit: Payer: Medicare Other | Admitting: Internal Medicine

## 2016-03-03 ENCOUNTER — Ambulatory Visit: Payer: Medicare Other

## 2016-03-03 DIAGNOSIS — J9601 Acute respiratory failure with hypoxia: Secondary | ICD-10-CM

## 2016-03-03 DIAGNOSIS — K311 Adult hypertrophic pyloric stenosis: Secondary | ICD-10-CM

## 2016-03-03 DIAGNOSIS — I483 Typical atrial flutter: Secondary | ICD-10-CM

## 2016-03-03 LAB — BASIC METABOLIC PANEL
Anion gap: 15 (ref 5–15)
BUN: 37 mg/dL — ABNORMAL HIGH (ref 6–20)
CO2: 20 mmol/L — ABNORMAL LOW (ref 22–32)
Calcium: 9.3 mg/dL (ref 8.9–10.3)
Chloride: 87 mmol/L — ABNORMAL LOW (ref 101–111)
Creatinine, Ser: 1.41 mg/dL — ABNORMAL HIGH (ref 0.44–1.00)
GFR calc Af Amer: 37 mL/min — ABNORMAL LOW (ref >60)
GFR calc non Af Amer: 32 mL/min — ABNORMAL LOW (ref >60)
Glucose, Bld: 261 mg/dL — ABNORMAL HIGH (ref 65–99)
Potassium: 4.8 mmol/L (ref 3.5–5.1)
Sodium: 122 mmol/L — ABNORMAL LOW (ref 135–145)

## 2016-03-03 LAB — GLUCOSE, CAPILLARY
Glucose-Capillary: 246 mg/dL — ABNORMAL HIGH (ref 65–99)
Glucose-Capillary: 164 mg/dL — ABNORMAL HIGH (ref 65–99)
Glucose-Capillary: 168 mg/dL — ABNORMAL HIGH (ref 65–99)
Glucose-Capillary: 234 mg/dL — ABNORMAL HIGH (ref 65–99)

## 2016-03-03 LAB — PROTIME-INR
INR: 1.71
Prothrombin Time: 20.3 seconds — ABNORMAL HIGH (ref 11.4–15.2)

## 2016-03-03 MED ORDER — OSMOLITE 1.5 CAL PO LIQD
1000.0000 mL | ORAL | Status: DC
  Filled 2016-03-03 (×2): qty 1000

## 2016-03-03 MED ORDER — WARFARIN SODIUM 2 MG PO TABS: 4.0000 mg | ORAL_TABLET | Freq: Once | ORAL | Status: DC

## 2016-03-03 MED ORDER — WARFARIN SODIUM 2 MG PO TABS
4.0000 mg | ORAL_TABLET | Freq: Once | ORAL | Status: AC
  Administered 2016-03-03: 4 mg via ORAL
  Filled 2016-03-03: qty 2

## 2016-03-03 MED ORDER — ENSURE ENLIVE PO LIQD
237.0000 mL | Freq: Two times a day (BID) | ORAL | Status: DC
  Administered 2016-03-03 – 2016-03-07 (×8): 237 mL via ORAL

## 2016-03-03 MED ORDER — PRO-STAT SUGAR FREE PO LIQD: 30.0000 mL | Freq: Two times a day (BID) | ORAL | Status: DC

## 2016-03-03 MED ORDER — OSMOLITE 1.5 CAL PO LIQD
1000.0000 mL | ORAL | Status: DC
  Administered 2016-03-03 – 2016-03-09 (×7): 1000 mL
  Filled 2016-03-03 (×8): qty 1000

## 2016-03-03 MED ORDER — FUROSEMIDE 10 MG/ML IJ SOLN
40.0000 mg | Freq: Two times a day (BID) | INTRAMUSCULAR | Status: DC
  Administered 2016-03-03 – 2016-03-05 (×4): 40 mg via INTRAVENOUS
  Filled 2016-03-03 (×5): qty 4

## 2016-03-03 MED ORDER — JEVITY 1.2 CAL PO LIQD: 1000.0000 mL | ORAL | Status: DC

## 2016-03-03 MED ORDER — PRO-STAT SUGAR FREE PO LIQD
30.0000 mL | Freq: Two times a day (BID) | ORAL | Status: DC
  Administered 2016-03-04 – 2016-03-12 (×17): 30 mL via ORAL
  Administered 2016-03-12: 11:00:00 via ORAL
  Administered 2016-03-13 – 2016-03-14 (×4): 30 mL via ORAL
  Filled 2016-03-03 (×21): qty 30

## 2016-03-03 NOTE — Consult Note (Signed)
[facility] CM Inpatient Consult   03/03/2016  Shannon [name] [birth year] [id number]  Patient screened for potential [facility] Care Management services for re-admission for HF exacerbation. Chart review reveals per MD History and Physical, Shannon Smithis a 82 y.o.femalewho presents today for an urgent visit as her daughter told her yesterday with concern of 8 pounds weight gain in 2 days, shortness of breath, orthopnea and worsening lower extremity edema. This has started a few days ago when she noticed worsening shortness of breath now at rest, also paroxysmal nocturnal dyspnea as well as worsening lower extremity edema. She has noticed 5 pound weight increase overnight from 147-152 pounds. The patient states that she has been coughing a lot in the last few days with sputum of greenish color, she denies any fever or chills. She denies palpitations or syncope. Denies chest pain. This is a very pleasant elderly female with history of persistent atrial fibrillation on amiodarone, bradycardia, CKD stage III,hypothyroidism,non-obstructive coronary artery disease, type 2 diabetes, hypertension, chronic diastolic CHF, stroke, cerebrovascular disease, COPD, asthma, gastric outlet obstruction s/p prior J tube, anemia.  This is the patient's 6th admission in 6 months.  Patient is eligible for [facility] Care Management services under patient's Medicare plan. Met with the patient and Shannon [name], daughter, at the bedside regarding her health and disposition needs.  Patient and daughter endorses Dr. Kirt Boys as her primary care provider.  Daughter is very pleasant and states, "I don't know what else could have been done with my symptoms and all.  I got her to an appointment because we monitor her weight, we adhere to her dietary recommendations, and her medication recommendations.  Her sodium was low, she took on weight with difficulty breathing.  They are being cautious with any water pills because that  causes problems in other area.   I don't think she needs to go anywhere [skilled facility] as long as we are working things out at home.  Daughter is a Child psychotherapist.  She states she has reached out to her [employer] [Federal] supplemental regarding potential benefits she may have available.  She states she had just received that information.  She states she doesn't know of anything community case management could help her with.  She states she has the brochure and the 24 hour nurse advise line magnet.  Encouraged her to call even for support or concerns.  She verbalized she would and Please place a [facility] Care Management consult or for questions contact:   Shannon Shanks, RN BSN CCM [facility]  [phone] business mobile phone Toll free office [phone]

## 2016-03-03 NOTE — Progress Notes (Signed)
Patient Name: Shannon [name] Date of Encounter: 03/03/2016  Primary Cardiologist: Ena Dawley MD  Hospital Problem List     Active Problems:   Acute on chronic diastolic CHF (congestive heart failure) (HCC)     Subjective   Feels better today. Less SOB. Some dull pain in left chest. Worried about flushing her GE tube.  Inpatient Medications    Scheduled Meds: . amiodarone  200 mg Oral Daily  . atorvastatin  10 mg Oral q1800  . Chlorhexidine Gluconate Cloth  6 each Topical Q0600  . cycloSPORINE  1 drop Both Eyes BID  . diltiazem  120 mg Oral Daily  . fluticasone  2 spray Each Nare Daily  . furosemide  40 mg Intravenous Q6H  . gabapentin  300 mg Oral TID  . hydrALAZINE  50 mg Oral TID  . insulin aspart  0-9 Units Subcutaneous TID WC  . isosorbide mononitrate  30 mg Oral Daily  . latanoprost  1 drop Both Eyes QHS  . levothyroxine  137 mcg Oral QAC breakfast  . loratadine  10 mg Oral Daily  . mometasone-formoterol  2 puff Inhalation BID  . mupirocin ointment  1 application Nasal BID  . pantoprazole  40 mg Oral BID  . polyvinyl alcohol  1 drop Both Eyes QID  . sertraline  100 mg Oral Daily  . sodium chloride flush  3 mL Intravenous Q12H  . warfarin  4 mg Oral ONCE-1800  . Warfarin - Pharmacist Dosing Inpatient   Does not apply q1800   Continuous Infusions:  PRN Meds: sodium chloride, acetaminophen, albuterol, HYDROcodone-acetaminophen, ipratropium-albuterol, ondansetron (ZOFRAN) IV, simethicone, sodium chloride flush, traMADol   Vital Signs    Vitals:   03/02/16 2342 03/03/16 0414 03/03/16 0722 03/03/16 0723  BP: (!) 152/60 (!) 143/56    Pulse: 88 92    Resp: 20 18    Temp: 98.2 F (36.8 C) 98.3 F (36.8 C)    TempSrc: Oral Oral    SpO2: 100% 100% 98% 98%  Weight:  148 lb 8 oz (67.4 kg)    Height:        Intake/Output Summary (Last 24 hours) at 03/03/16 1020 Last data filed at 03/03/16 0830  Gross per 24 hour  Intake              175 ml  Output              3350 ml  Net            -3175 ml   Filed Weights   03/02/16 1137 03/03/16 0414  Weight: 156 lb 9.6 oz (71 kg) 148 lb 8 oz (67.4 kg)    Physical Exam   GEN: elderly and frail, chronically ill, in mild respiratorydistress  HEENT: normal  Neck: no JVD, carotid bruits, or masses Cardiac: tachycardic irregular Respiratory: cracklesin bases bilaterally, laboredwork of breathing GI: soft, nontender, nondistended, + BS MS: diffuse muscle atrophy  Skin: warm and dry, +2 LE edema up to the knees Neuro: Strength and sensation are intact Psych: euthymic mood, full affect   Labs    CBC  Recent Labs  03/02/16 1210  WBC 5.7  NEUTROABS 4.5  HGB 8.2*  HCT 23.9*  MCV 84.2  PLT 211   Basic Metabolic Panel  Recent Labs  03/02/16 1210 03/03/16 0356  NA 121* 122*  K 5.3* 4.8  CL 89* 87*  CO2 23 20*  GLUCOSE 157* 261*  BUN 34* 37*  CREATININE 1.26* 1.41*  CALCIUM 8.9 9.3   Liver Function Tests  Recent Labs  03/02/16 1210  AST 30  ALT 30  ALKPHOS 61  BILITOT 0.5  PROT 6.4*  ALBUMIN 3.4*   No results for input(s): LIPASE, AMYLASE in the last 72 hours. Cardiac Enzymes No results for input(s): CKTOTAL, CKMB, CKMBINDEX, TROPONINI in the last 72 hours. BNP Invalid input(s): POCBNP D-Dimer No results for input(s): DDIMER in the last 72 hours. Hemoglobin A1C No results for input(s): HGBA1C in the last 72 hours. Fasting Lipid Panel No results for input(s): CHOL, HDL, LDLCALC, TRIG, CHOLHDL, LDLDIRECT in the last 72 hours. Thyroid Function Tests No results for input(s): TSH, T4TOTAL, T3FREE, THYROIDAB in the last 72 hours.  Invalid input(s): FREET3  Telemetry    Atrial flutter with rate 100.  - Personally Reviewed  ECG    Atrial flutter, nonspecific ST- T changes.  - Personally Reviewed  Radiology    Dg Chest 2 View  Result Date: 03/02/2016 CLINICAL DATA:  Shortness of Breath EXAM: CHEST  2 VIEW COMPARISON:  02/07/2016 FINDINGS: Cardiomediastinal  silhouette is stable. There is chronic elevation of the right hemidiaphragm. There is streaky left base retrocardiac atelectasis or infiltrate best seen on lateral view. Mild compression deformity lower thoracic spine of indeterminate age. Clinical correlation is necessary. IMPRESSION: There is streaky left base retrocardiac atelectasis or infiltrate best seen on lateral view. Mild compression deformity lower thoracic spine of indeterminate age. Clinical correlation is necessary. Electronically Signed   By: Lahoma Crocker M.D.   On: 03/02/2016 14:00    Cardiac Studies   TTE: 01/27/2016 - Left ventricle: The cavity size was normal. Wall thickness was normal. Systolic function was normal. The estimated ejection fraction was in the range of 55% to 60%. Wall motion was normal; there were no regional wall motion abnormalities. Doppler parameters are consistent with abnormal left ventricular relaxation (grade 1 diastolic dysfunction). - Aortic valve: There was mild regurgitation. - Mitral valve: There was mild regurgitation. - Left atrium: The atrium was moderately dilated.  Impressions: - Normal LV systolic function; grade 1 diastolic dysfunction; mild AI; mild MR; moderate LAE.  Patient Profile     Elderly female with history of persistent atrial fibrillation on amiodarone, bradycardia, CKD stage III,hypothyroidism,non-obstructive coronary artery disease, type 2 diabetes, hypertension, chronic diastolic CHF, stroke, cerebrovascular disease, COPD, asthma, gastric outlet obstruction s/p prior J tube, anemia. Presents with acute respiratory failure sec to acute on chronic diastolic CHF.  Assessment & Plan    1. Acute on chronic diastolic CHF, 8 lbs weight gain in 2 days,  - on Lasix 40 iv Q6H, diuresing well. I/O negative 2475 since yesterday. Weight down ? 8 lbs. she is feeling much better. Monitor Crea closely. Will reduce Lasix to Bid.  2. Acute respiratory failure - sec to  acute on chronic CHF, doubt respiratory infection based on CXR and lack of fever. The patient has underlying asthma, this might be an acute exacerbation however no wheezing on physical exam.  3. Afib/ atrial flutter - in atrial flutter today. Rate controlled. Moderate LA enlargement Onamiodarone to 200mg  daily Consider cardioversion if arrhythmia persists once acute issues are resolved. Continue coumadin  4. Prior bradycardia Sinus bradycardia previously noted on labetalol On low dosediltiazem Per Dr Rayann Heman given advanced age and comorbidities, would avoid pacing long term  5. H/o GOO s/p ge-tube. Dietary consulted to resume enteral feeds.  Her prognosis is poor. Ultimately, a palliative approach should be considered.    Signed, Cylan Borum [place], MD  03/03/2016, 10:20 AM

## 2016-03-03 NOTE — Progress Notes (Signed)
ANTICOAGULATION CONSULT NOTE - Follow Up Consult  Pharmacy Consult for Coumadin Indication: Afib/Aflutter  Allergies  Allergen Reactions  . Penicillins Itching    Has patient had a PCN reaction causing immediate rash, facial/tongue/throat swelling, SOB or lightheadedness with hypotension: Unknown Has patient had a PCN reaction causing severe rash involving mucus membranes or skin necrosis: Unknown Has patient had a PCN reaction that required hospitalization: Unknown Has patient had a PCN reaction occurring within the last 10 years: Unknown If all of the above answers are "NO", then may proceed with Cephalosporin use.     Patient Measurements: Height: 5\' 6"  (167.6 cm) Weight: 148 lb 8 oz (67.4 kg) (scale c) IBW/kg (Calculated) : 59.3   Vital Signs: Temp: 98.3 F (36.8 C) (12/01 0414) Temp Source: Oral (12/01 0414) BP: 143/56 (12/01 0414) Pulse Rate: 92 (12/01 0414)  Labs:  Recent Labs  03/02/16 1210 03/03/16 0356  HGB 8.2*  --   HCT 23.9*  --   PLT 285  --   LABPROT 19.9* 20.3*  INR 1.67 1.71  CREATININE 1.26* 1.41*    Estimated Creatinine Clearance: 24.8 mL/min (by C-G formula based on SCr of 1.41 mg/dL (H)).  Assessment: 89yof continues on coumadin for afib/flutter. INR remains below goal at 1.71. No bleeding reported.  Home dose: 3.75mg  daily  Goal of Therapy:  INR 2-3 Monitor platelets by anticoagulation protocol: Yes   Plan:  1) Repeat coumadin 4mg  x 1 2) Daily INR  Deboraha Sprang 03/03/2016,9:19 AM

## 2016-03-03 NOTE — Progress Notes (Signed)
Assisted Jettie Booze NP-C with JG tube dressing change.  Removed old dressing that had some old drainage on it.  Cleaned with normal saline and CHG wipes; applied skin prep; applied split gauze x2; applied tape edging; flushed J-tube with 50cc NS.

## 2016-03-03 NOTE — Progress Notes (Signed)
Initial Nutrition Assessment   INTERVENTION:  Provide Osmolite 1.5 @ 65 ml/hr via J port of GJ tube at rate of 65 ml/hr for 12 hours daily from 2000 hr to 0800 hr.   Provide 30 ml Prostat BID via J-tube.    Tube feeding regimen provides 1370 kcal (100% of needs), 79 grams of protein, and 592 ml of H2O.   Provide Ensure Enlive BID, each supplement provides 350 kcal and 20 grams of protein.    NUTRITION DIAGNOSIS:   Inadequate oral intake related to altered GI function as evidenced by other (see comment) (Full liquid diet).   GOAL:   Patient will meet greater than or equal to 90% of their needs   MONITOR:   PO intake, TF tolerance, Skin, I & O's, Labs, Weight trends  REASON FOR ASSESSMENT:   Consult Enteral/tube feeding initiation and management  ASSESSMENT:   82 year old female  history of persistent atrial fibrillation on amiodarone, bradycardia, CKD stage III, hypothyroidism, non-obstructive coronary artery disease, type 2 diabetes, hypertension, chronic diastolic CHF, stroke, cerebrovascular disease, COPD, asthma, gastric outlet obstruction s/p prior J tube, anemia. Presents with acute respiratory failure sec to acute on chronic diastolic CHF.  Pt's daughter at bedside assisted patient in providing history. Pt receives Osmolite 1.5 tube feeding formula at 65 ml/hr via J-port of GJ tube for 12 hours daily from 2000 hr to 0800 hr. Pt consumes full liquids PO during the day such as Boost Plus, Boost Breeze, juice, jello, pudding, Gingerale and cream of chicken soup. Sometimes regular soda, sometimes sugar free. She also receives 30 ml of Pro-Stat twice daily. Weight history shows that patient had lost down to 131 lbs earlier in the month. Daughter reports that patient regained 1-2 lbs, but most of weight gain was fluid weight.  Per chart, pt receives 230 ml free water flush TID.   Labs: glucose ranging 164 to 313 mg/dL, low hemoglobin, low sodium, low chloride, elevated  BUN  Diet Order:  Diet full liquid Room service appropriate? Yes; Fluid consistency: Thin  Skin:  Reviewed, no issues  Last BM:  11/29  Height:   Ht Readings from Last 1 Encounters:  03/02/16 5\' 6"  (1.676 m)    Weight:   Wt Readings from Last 1 Encounters:  03/03/16 148 lb 8 oz (67.4 kg)    Ideal Body Weight:  59.1 kg  BMI:  Body mass index is 23.97 kg/m.  Estimated Nutritional Needs:   Kcal:  1300-1500  Protein:  70-85 grams  Fluid:  1.5 L/day  EDUCATION NEEDS:   No education needs identified at this time  [place], CSP, LDN Inpatient Clinical Dietitian Pager: [phone] After Hours Pager: [phone]

## 2016-03-03 NOTE — Progress Notes (Signed)
Pt's MRSA swab came positive, MRSA protocol is in place, pt is in full liquid diet, tolerating well, will continue to monitor

## 2016-03-04 LAB — BASIC METABOLIC PANEL
Anion gap: 11 (ref 5–15)
BUN: 38 mg/dL — ABNORMAL HIGH (ref 6–20)
CO2: 25 mmol/L (ref 22–32)
Calcium: 9.1 mg/dL (ref 8.9–10.3)
Chloride: 90 mmol/L — ABNORMAL LOW (ref 101–111)
Creatinine, Ser: 1.34 mg/dL — ABNORMAL HIGH (ref 0.44–1.00)
GFR calc Af Amer: 39 mL/min — ABNORMAL LOW (ref >60)
GFR calc non Af Amer: 34 mL/min — ABNORMAL LOW (ref >60)
Glucose, Bld: 229 mg/dL — ABNORMAL HIGH (ref 65–99)
Potassium: 3.9 mmol/L (ref 3.5–5.1)
Sodium: 126 mmol/L — ABNORMAL LOW (ref 135–145)

## 2016-03-04 LAB — GLUCOSE, CAPILLARY
Glucose-Capillary: 269 mg/dL — ABNORMAL HIGH (ref 65–99)
Glucose-Capillary: 178 mg/dL — ABNORMAL HIGH (ref 65–99)
Glucose-Capillary: 183 mg/dL — ABNORMAL HIGH (ref 65–99)
Glucose-Capillary: 177 mg/dL — ABNORMAL HIGH (ref 65–99)
Glucose-Capillary: 255 mg/dL — ABNORMAL HIGH (ref 65–99)

## 2016-03-04 LAB — PROTIME-INR
INR: 1.97
Prothrombin Time: 22.7 seconds — ABNORMAL HIGH (ref 11.4–15.2)

## 2016-03-04 MED ORDER — WARFARIN SODIUM 2 MG PO TABS
4.0000 mg | ORAL_TABLET | Freq: Once | ORAL | Status: AC
  Administered 2016-03-04: 4 mg via ORAL
  Filled 2016-03-04: qty 2

## 2016-03-04 MED ORDER — FREE WATER
200.0000 mL | Status: DC
  Administered 2016-03-04 – 2016-03-07 (×15): 200 mL

## 2016-03-04 NOTE — Progress Notes (Signed)
Patient Name: Shannon [name] Date of Encounter: 03/04/2016  Primary Cardiologist: [facility] Problem List     Active Problems:   Acute on chronic diastolic CHF (congestive heart failure) (HCC)     Subjective   Says breathing is slightly better.  Inpatient Medications    Scheduled Meds: . amiodarone  200 mg Oral Daily  . atorvastatin  10 mg Oral q1800  . Chlorhexidine Gluconate Cloth  6 each Topical Q0600  . cycloSPORINE  1 drop Both Eyes BID  . diltiazem  120 mg Oral Daily  . feeding supplement (ENSURE ENLIVE)  237 mL Oral BID BM  . feeding supplement (OSMOLITE 1.5 CAL)  1,000 mL Per Tube Q24H  . feeding supplement (PRO-STAT SUGAR FREE 64)  30 mL Oral BID BM  . fluticasone  2 spray Each Nare Daily  . furosemide  40 mg Intravenous Q12H  . gabapentin  300 mg Oral TID  . hydrALAZINE  50 mg Oral TID  . insulin aspart  0-9 Units Subcutaneous TID WC  . isosorbide mononitrate  30 mg Oral Daily  . latanoprost  1 drop Both Eyes QHS  . levothyroxine  137 mcg Oral QAC breakfast  . loratadine  10 mg Oral Daily  . mometasone-formoterol  2 puff Inhalation BID  . mupirocin ointment  1 application Nasal BID  . pantoprazole  40 mg Oral BID  . polyvinyl alcohol  1 drop Both Eyes QID  . sertraline  100 mg Oral Daily  . sodium chloride flush  3 mL Intravenous Q12H  . Warfarin - Pharmacist Dosing Inpatient   Does not apply q1800   Continuous Infusions:  PRN Meds: sodium chloride, acetaminophen, albuterol, HYDROcodone-acetaminophen, ipratropium-albuterol, ondansetron (ZOFRAN) IV, simethicone, sodium chloride flush, traMADol   Vital Signs    Vitals:   03/03/16 2045 03/04/16 0020 03/04/16 0515 03/04/16 0710  BP:  131/66 (!) 141/71   Pulse:  62 82   Resp:  18 20   Temp:  97.7 F (36.5 C) 98.3 F (36.8 C)   TempSrc:  Oral Oral   SpO2: 100% 100% 100%   Weight:    147 lb 3.2 oz (66.8 kg)  Height:        Intake/Output Summary (Last 24 hours) at 03/04/16 1049 Last data filed  at 03/04/16 0919  Gross per 24 hour  Intake          2485.92 ml  Output             3651 ml  Net         -1165.08 ml   Filed Weights   03/02/16 1137 03/03/16 0414 03/04/16 0710  Weight: 156 lb 9.6 oz (71 kg) 148 lb 8 oz (67.4 kg) 147 lb 3.2 oz (66.8 kg)    Physical Exam    GEN: elderly and frail, chronically ill, in no respiratorydistress  HEENT: normal  Neck: no JVD, carotid bruits, or masses Cardiac: regular rate, irregular rhythm, normal S1S2, no S3 Respiratory: wheezes b/l GI: soft, nontender, nondistended MS: diffuse muscle atrophy  Skin: warm and dry, 1+ LE edema up to the knees Neuro: Strength and sensation are intact Psych: euthymic mood, full affect  Labs    CBC  Recent Labs  03/02/16 1210  WBC 5.7  NEUTROABS 4.5  HGB 8.2*  HCT 23.9*  MCV 84.2  PLT 967   Basic Metabolic Panel  Recent Labs  03/03/16 0356 03/04/16 0324  NA 122* 126*  K 4.8 3.9  CL 87* 90*  CO2 20* 25  GLUCOSE 261* 229*  BUN 37* 38*  CREATININE 1.41* 1.34*  CALCIUM 9.3 9.1   Liver Function Tests  Recent Labs  03/02/16 1210  AST 30  ALT 30  ALKPHOS 61  BILITOT 0.5  PROT 6.4*  ALBUMIN 3.4*   No results for input(s): LIPASE, AMYLASE in the last 72 hours. Cardiac Enzymes No results for input(s): CKTOTAL, CKMB, CKMBINDEX, TROPONINI in the last 72 hours. BNP Invalid input(s): POCBNP D-Dimer No results for input(s): DDIMER in the last 72 hours. Hemoglobin A1C No results for input(s): HGBA1C in the last 72 hours. Fasting Lipid Panel No results for input(s): CHOL, HDL, LDLCALC, TRIG, CHOLHDL, LDLDIRECT in the last 72 hours. Thyroid Function Tests No results for input(s): TSH, T4TOTAL, T3FREE, THYROIDAB in the last 72 hours.  Invalid input(s): FREET3  Telemetry    Rate controlled a fib - Personally Reviewed  ECG     - Personally Reviewed  Radiology    Dg Chest 2 View  Result Date: 03/02/2016 CLINICAL DATA:  Shortness of Breath EXAM: CHEST  2 VIEW  COMPARISON:  02/07/2016 FINDINGS: Cardiomediastinal silhouette is stable. There is chronic elevation of the right hemidiaphragm. There is streaky left base retrocardiac atelectasis or infiltrate best seen on lateral view. Mild compression deformity lower thoracic spine of indeterminate age. Clinical correlation is necessary. IMPRESSION: There is streaky left base retrocardiac atelectasis or infiltrate best seen on lateral view. Mild compression deformity lower thoracic spine of indeterminate age. Clinical correlation is necessary. Electronically Signed   By: Lahoma Crocker M.D.   On: 03/02/2016 14:00    Cardiac Studies     Patient Profile     Elderly female with history of persistent atrial fibrillation on amiodarone, bradycardia, CKD stage III,hypothyroidism,non-obstructive coronary artery disease, type 2 diabetes, hypertension, chronic diastolic CHF, stroke, cerebrovascular disease, COPD, asthma, gastric outlet obstruction s/p prior J tube, anemia. Presents with acute respiratory failure sec to acute on chronic diastolic CHF.  Assessment & Plan    1. Acute on chronic diastolic CHF, 8 lbs weight gain in 2 days,  - on Lasix 40 iv Q12H, diuresing well. I/O negative 860 cc since yesterday, another 610 cc out this morning. She is feeling much better. Monitor Cr closely.   2. Acute respiratory failure - sec to acute on chronic CHF, doubt respiratory infection based on CXR and lack of fever. The patient has underlying asthma, this might be an acute exacerbation as she is wheezing on physical exam.  3. Afib/ atrial flutter - in atrial fib today. Rate controlled. Moderate LA enlargement Onamiodarone to 200mg  daily Consider cardioversion if arrhythmia persists once acute issues are resolved. Continue Coumadin  4. Prior bradycardia Sinus bradycardia previously noted on labetalol On low dosediltiazem 120 mg daily Per Dr Rayann Heman given advanced age and comorbidities, would avoid pacing long  term  5. H/o GOO s/p ge-tube. Dietary consulted to resume enteral feeds.  Her prognosis is poor. Ultimately, a palliative approach should be considered.    Signed, Kate Sable, MD  03/04/2016, 10:49 AM

## 2016-03-04 NOTE — Progress Notes (Signed)
Pt's NA level is 126 recent one, paged and called NP, no any interventions taken this time, Pt's PEG tube flushing and dressing continue,  Will continue to monitor the patient  Palma Holter, RN

## 2016-03-04 NOTE — Progress Notes (Signed)
ANTICOAGULATION CONSULT NOTE - Follow Up Consult  Pharmacy Consult for Coumadin Indication: Afib/Aflutter  Allergies  Allergen Reactions  . Penicillins Itching    Has patient had a PCN reaction causing immediate rash, facial/tongue/throat swelling, SOB or lightheadedness with hypotension: Unknown Has patient had a PCN reaction causing severe rash involving mucus membranes or skin necrosis: Unknown Has patient had a PCN reaction that required hospitalization: Unknown Has patient had a PCN reaction occurring within the last 10 years: Unknown If all of the above answers are "NO", then may proceed with Cephalosporin use.     Patient Measurements: Height: 5\' 6"  (167.6 cm) Weight: 147 lb 3.2 oz (66.8 kg) (scale c) IBW/kg (Calculated) : 59.3   Vital Signs: Temp: 98.2 F (36.8 C) (12/02 1237) Temp Source: Oral (12/02 1237) BP: 130/49 (12/02 1237) Pulse Rate: 68 (12/02 1237)  Labs:  Recent Labs  03/02/16 1210 03/03/16 0356 03/04/16 0324  HGB 8.2*  --   --   HCT 23.9*  --   --   PLT 285  --   --   LABPROT 19.9* 20.3* 22.7*  INR 1.67 1.71 1.97  CREATININE 1.26* 1.41* 1.34*    Estimated Creatinine Clearance: 26.1 mL/min (by C-G formula based on SCr of 1.34 mg/dL (H)).  Assessment: 89yof continues on coumadin for afib/flutter. INR remains below goal at 1.97 but improving. No bleeding reported. No new CBC today.   Home dose: 3.75mg  daily  Goal of Therapy:  INR 2-3 Monitor platelets by anticoagulation protocol: Yes   Plan:  1) Repeat coumadin 4mg  x 1 2) Daily INR  Nycole Kawahara, Rande Lawman 03/04/2016,1:33 PM

## 2016-03-05 ENCOUNTER — Inpatient Hospital Stay (HOSPITAL_COMMUNITY): Payer: Medicare Other

## 2016-03-05 DIAGNOSIS — R001 Bradycardia, unspecified: Secondary | ICD-10-CM

## 2016-03-05 DIAGNOSIS — J9620 Acute and chronic respiratory failure, unspecified whether with hypoxia or hypercapnia: Secondary | ICD-10-CM

## 2016-03-05 DIAGNOSIS — R05 Cough: Secondary | ICD-10-CM

## 2016-03-05 LAB — BASIC METABOLIC PANEL
Anion gap: 12 (ref 5–15)
BUN: 46 mg/dL — ABNORMAL HIGH (ref 6–20)
CO2: 26 mmol/L (ref 22–32)
Calcium: 9.1 mg/dL (ref 8.9–10.3)
Chloride: 90 mmol/L — ABNORMAL LOW (ref 101–111)
Creatinine, Ser: 1.36 mg/dL — ABNORMAL HIGH (ref 0.44–1.00)
GFR calc Af Amer: 38 mL/min — ABNORMAL LOW (ref >60)
GFR calc non Af Amer: 33 mL/min — ABNORMAL LOW (ref >60)
Glucose, Bld: 260 mg/dL — ABNORMAL HIGH (ref 65–99)
Potassium: 4.2 mmol/L (ref 3.5–5.1)
Sodium: 128 mmol/L — ABNORMAL LOW (ref 135–145)

## 2016-03-05 LAB — GLUCOSE, CAPILLARY
Glucose-Capillary: 174 mg/dL — ABNORMAL HIGH (ref 65–99)
Glucose-Capillary: 209 mg/dL — ABNORMAL HIGH (ref 65–99)
Glucose-Capillary: 217 mg/dL — ABNORMAL HIGH (ref 65–99)
Glucose-Capillary: 248 mg/dL — ABNORMAL HIGH (ref 65–99)
Glucose-Capillary: 252 mg/dL — ABNORMAL HIGH (ref 65–99)
Glucose-Capillary: 292 mg/dL — ABNORMAL HIGH (ref 65–99)

## 2016-03-05 LAB — PROTIME-INR
INR: 1.98
Prothrombin Time: 22.8 seconds — ABNORMAL HIGH (ref 11.4–15.2)

## 2016-03-05 MED ORDER — WARFARIN SODIUM 5 MG PO TABS
5.0000 mg | ORAL_TABLET | Freq: Once | ORAL | Status: AC
  Administered 2016-03-05: 5 mg via ORAL
  Filled 2016-03-05: qty 1

## 2016-03-05 MED ORDER — BENZONATATE 100 MG PO CAPS
200.0000 mg | ORAL_CAPSULE | Freq: Three times a day (TID) | ORAL | Status: DC
  Administered 2016-03-05 – 2016-03-14 (×29): 200 mg via ORAL
  Filled 2016-03-05 (×29): qty 2

## 2016-03-05 MED ORDER — FUROSEMIDE 40 MG PO TABS
40.0000 mg | ORAL_TABLET | Freq: Every day | ORAL | Status: DC
  Administered 2016-03-05 – 2016-03-07 (×3): 40 mg via ORAL
  Filled 2016-03-05 (×2): qty 1
  Filled 2016-03-05: qty 2

## 2016-03-05 NOTE — Progress Notes (Signed)
Patient Name: Shannon [name] Date of Encounter: 03/05/2016  Primary Cardiologist: [facility] Problem List     Active Problems:   Acute on chronic diastolic CHF (congestive heart failure) (HCC)     Subjective   Breathing better but coughing productively. No chest pain.   Inpatient Medications    Scheduled Meds: . amiodarone  200 mg Oral Daily  . atorvastatin  10 mg Oral q1800  . Chlorhexidine Gluconate Cloth  6 each Topical Q0600  . cycloSPORINE  1 drop Both Eyes BID  . diltiazem  120 mg Oral Daily  . feeding supplement (ENSURE ENLIVE)  237 mL Oral BID BM  . feeding supplement (OSMOLITE 1.5 CAL)  1,000 mL Per Tube Q24H  . feeding supplement (PRO-STAT SUGAR FREE 64)  30 mL Oral BID BM  . fluticasone  2 spray Each Nare Daily  . free water  200 mL Per Tube Q4H  . furosemide  40 mg Intravenous Q12H  . gabapentin  300 mg Oral TID  . hydrALAZINE  50 mg Oral TID  . insulin aspart  0-9 Units Subcutaneous TID WC  . isosorbide mononitrate  30 mg Oral Daily  . latanoprost  1 drop Both Eyes QHS  . levothyroxine  137 mcg Oral QAC breakfast  . loratadine  10 mg Oral Daily  . mometasone-formoterol  2 puff Inhalation BID  . mupirocin ointment  1 application Nasal BID  . pantoprazole  40 mg Oral BID  . polyvinyl alcohol  1 drop Both Eyes QID  . sertraline  100 mg Oral Daily  . sodium chloride flush  3 mL Intravenous Q12H  . Warfarin - Pharmacist Dosing Inpatient   Does not apply q1800   Continuous Infusions:  PRN Meds: sodium chloride, acetaminophen, albuterol, HYDROcodone-acetaminophen, ipratropium-albuterol, ondansetron (ZOFRAN) IV, simethicone, sodium chloride flush, traMADol   Vital Signs    Vitals:   03/04/16 1237 03/04/16 2031 03/05/16 0518 03/05/16 0818  BP: (!) 130/49 131/64 134/65   Pulse: 68 69 72   Resp: 18 18 18    Temp: 98.2 F (36.8 C) 98.3 F (36.8 C) 98.3 F (36.8 C)   TempSrc: Oral Oral Oral   SpO2: 100% 100% 100% 99%  Weight:   145 lb 3.2 oz (65.9  kg)   Height:        Intake/Output Summary (Last 24 hours) at 03/05/16 1051 Last data filed at 03/05/16 0858  Gross per 24 hour  Intake             1885 ml  Output             2652 ml  Net             -767 ml   Filed Weights   03/03/16 0414 03/04/16 0710 03/05/16 0518  Weight: 148 lb 8 oz (67.4 kg) 147 lb 3.2 oz (66.8 kg) 145 lb 3.2 oz (65.9 kg)    Physical Exam    GEN: elderly and frail, chronically ill, in no respiratorydistress  HEENT: normal  Neck: no JVD, carotid bruits, or masses Cardiac: iregular rate, irregular rhythm, normal S1S2, no S3 Respiratory: Mild inspiratory wheezes. Bilateral crackles. Frequent productive coughing.  GI: soft, nontender, nondistended, feeding tube in place. MS: diffuse muscle atrophy  Skin: warm and dry, No LE edema  Neuro: Strength and sensation are intact Psych: euthymic mood, full affect  Labs    CBC  Recent Labs  03/02/16 1210  WBC 5.7  NEUTROABS 4.5  HGB 8.2*  HCT  23.9*  MCV 84.2  PLT 179   Basic Metabolic Panel  Recent Labs  03/04/16 0324 03/05/16 0427  NA 126* 128*  K 3.9 4.2  CL 90* 90*  CO2 25 26  GLUCOSE 229* 260*  BUN 38* 46*  CREATININE 1.34* 1.36*  CALCIUM 9.1 9.1   Liver Function Tests  Recent Labs  03/02/16 1210  AST 30  ALT 30  ALKPHOS 61  BILITOT 0.5  PROT 6.4*  ALBUMIN 3.4*     Telemetry    Rate controlled atrial flutter- Personally Reviewed  ECG     - Personally Reviewed  Radiology   Cardiomediastinal silhouette is stable. There is chronic elevation of the right hemidiaphragm. There is streaky left base retrocardiac atelectasis or infiltrate best seen on lateral view. Mild compression deformity lower thoracic spine of indeterminate age. Clinical correlation is necessary.  IMPRESSION: There is streaky left base retrocardiac atelectasis or infiltrate best seen on lateral view. Mild compression deformity lower thoracic spine of indeterminate age. Clinical correlation is  necessary.   Cardiac Studies  Echocardiogram 01/27/2016 Left ventricle: The cavity size was normal. Wall thickness was   normal. Systolic function was normal. The estimated ejection   fraction was in the range of 55% to 60%. Wall motion was normal;   there were no regional wall motion abnormalities. Doppler   parameters are consistent with abnormal left ventricular   relaxation (grade 1 diastolic dysfunction). - Aortic valve: There was mild regurgitation. - Mitral valve: There was mild regurgitation. - Left atrium: The atrium was moderately dilated.  Impressions:  - Normal LV systolic function; grade 1 diastolic dysfunction; mild   Patient Profile     Elderly female with history of persistent atrial fibrillation on amiodarone, bradycardia, CKD stage III,hypothyroidism,non-obstructive coronary artery disease, type 2 diabetes, hypertension, chronic diastolic CHF, stroke, cerebrovascular disease, COPD, asthma, gastric outlet obstruction s/p prior J tube, anemia. Presents with acute respiratory failure sec to acute on chronic diastolic CHF.  Assessment & Plan    1. Acute on chronic diastolic CHF, 8 lbs weight gain in 2 days,  - on Lasix 40 iv Q12H, diuresed  4.592 liters since admission with 2.950 from yesterday wt 145 lbs, down from 147 lbs. She is feeling better but continues to cough productively. Creatinine is stable. Will switch to Lasix 40 mg daily.  2. Acute respiratory failure - as a result  acute on chronic CHF, doubt respiratory infection based on CXR and lack of fever. She has frequent productive coughing with continue bilateral crackles.. Will provide expectorant for her.  The patient has underlying asthma, wheezing improved.  Will provide Tessalon pearls.  3. Afib/ atrial flutter - in atrial fib today. Rate controlled. Moderate LA enlargement Onamiodarone to 200mg  daily Consider cardioversion if arrhythmia persists once acute issues are resolved. Continue  Coumadin  4. Prior bradycardia Sinus bradycardia previously noted on labetalol On low dosediltiazem 120 mg daily Per Dr Rayann Heman given advanced age and comorbidities, would avoid pacing long term  5. H/o GOO s/p ge-tube. Dietary consulted to resume enteral feeds. They will need to discuss diet and Free water with family.   Her prognosis is poor. Ultimately, a palliative approach should be considered. Brought this up with daughter who I spoke to by phone this am. She stated it has been brought up before but she declined to discuss this now.    Signed, Jory Sims, NP  03/05/2016, 10:50 AM   Pt seen and examined. Note modified above to reflect my thoughts.  I agree that a palliative care approach is best.

## 2016-03-05 NOTE — Progress Notes (Signed)
ANTICOAGULATION CONSULT NOTE - Follow Up Consult  Pharmacy Consult for Coumadin Indication: Afib/Aflutter  Patient Measurements: Height: 5\' 6"  (167.6 cm) Weight: 145 lb 3.2 oz (65.9 kg) IBW/kg (Calculated) : 59.3   Vital Signs: Temp: 98.3 F (36.8 C) (12/03 0518) Temp Source: Oral (12/03 0518) BP: 134/65 (12/03 0518) Pulse Rate: 72 (12/03 0518)  Labs:  Recent Labs  03/03/16 0356 03/04/16 0324 03/05/16 0427  LABPROT 20.3* 22.7* 22.8*  INR 1.71 1.97 1.98  CREATININE 1.41* 1.34* 1.36*    Estimated Creatinine Clearance: 25.7 mL/min (by C-G formula based on SCr of 1.36 mg/dL (H)).  Assessment: 89yof continues on coumadin for afib/flutter. INR remains below goal at 1.98. No bleeding reported. No new CBC today.   Home dose: 3.75mg  daily  Goal of Therapy:  INR 2-3 Monitor platelets by anticoagulation protocol: Yes   Plan:  - Warfarin 5mg  PO x 1 tonight - Daily INR   Shannon [name], Shannon [name] 03/05/2016,12:20 PM

## 2016-03-06 ENCOUNTER — Inpatient Hospital Stay (HOSPITAL_COMMUNITY): Payer: Medicare Other

## 2016-03-06 LAB — GLUCOSE, CAPILLARY
Glucose-Capillary: 164 mg/dL — ABNORMAL HIGH (ref 65–99)
Glucose-Capillary: 247 mg/dL — ABNORMAL HIGH (ref 65–99)
Glucose-Capillary: 258 mg/dL — ABNORMAL HIGH (ref 65–99)
Glucose-Capillary: 265 mg/dL — ABNORMAL HIGH (ref 65–99)
Glucose-Capillary: 333 mg/dL — ABNORMAL HIGH (ref 65–99)

## 2016-03-06 LAB — BASIC METABOLIC PANEL
Anion gap: 11 (ref 5–15)
BUN: 48 mg/dL — ABNORMAL HIGH (ref 6–20)
CO2: 27 mmol/L (ref 22–32)
Calcium: 8.7 mg/dL — ABNORMAL LOW (ref 8.9–10.3)
Chloride: 90 mmol/L — ABNORMAL LOW (ref 101–111)
Creatinine, Ser: 1.35 mg/dL — ABNORMAL HIGH (ref 0.44–1.00)
GFR calc Af Amer: 39 mL/min — ABNORMAL LOW (ref >60)
GFR calc non Af Amer: 33 mL/min — ABNORMAL LOW (ref >60)
Glucose, Bld: 256 mg/dL — ABNORMAL HIGH (ref 65–99)
Potassium: 3.7 mmol/L (ref 3.5–5.1)
Sodium: 128 mmol/L — ABNORMAL LOW (ref 135–145)

## 2016-03-06 LAB — PROTIME-INR
INR: 2.24
Prothrombin Time: 25.2 seconds — ABNORMAL HIGH (ref 11.4–15.2)

## 2016-03-06 LAB — CBC WITH DIFFERENTIAL/PLATELET
Basophils Absolute: 0 10*3/uL (ref 0.0–0.1)
Basophils Relative: 0 %
Eosinophils Absolute: 0.3 10*3/uL (ref 0.0–0.7)
Eosinophils Relative: 3 %
HCT: 25.2 % — ABNORMAL LOW (ref 36.0–46.0)
Hemoglobin: 8.6 g/dL — ABNORMAL LOW (ref 12.0–15.0)
Lymphocytes Relative: 7 %
Lymphs Abs: 0.7 10*3/uL (ref 0.7–4.0)
MCH: 28.9 pg (ref 26.0–34.0)
MCHC: 34.1 g/dL (ref 30.0–36.0)
MCV: 84.6 fL (ref 78.0–100.0)
Monocytes Absolute: 2 10*3/uL — ABNORMAL HIGH (ref 0.1–1.0)
Monocytes Relative: 20 %
Neutro Abs: 7 10*3/uL (ref 1.7–7.7)
Neutrophils Relative %: 70 %
Platelets: 372 10*3/uL (ref 150–400)
RBC: 2.98 MIL/uL — ABNORMAL LOW (ref 3.87–5.11)
RDW: 17.8 % — ABNORMAL HIGH (ref 11.5–15.5)
WBC: 10 10*3/uL (ref 4.0–10.5)

## 2016-03-06 MED ORDER — WARFARIN SODIUM 7.5 MG PO TABS
3.7500 mg | ORAL_TABLET | Freq: Once | ORAL | Status: AC
  Administered 2016-03-06: 3.75 mg via ORAL
  Filled 2016-03-06: qty 0.5

## 2016-03-06 MED ORDER — GUAIFENESIN-DM 100-10 MG/5ML PO SYRP
5.0000 mL | ORAL_SOLUTION | ORAL | Status: DC | PRN
  Administered 2016-03-06 – 2016-03-12 (×6): 5 mL via ORAL
  Filled 2016-03-06 (×6): qty 5

## 2016-03-06 NOTE — Progress Notes (Signed)
ANTICOAGULATION CONSULT NOTE - Follow Up Consult  Pharmacy Consult for Coumadin Indication: Afib/Aflutter  Patient Measurements: Height: 5\' 6"  (167.6 cm) Weight: 140 lb 4.8 oz (63.6 kg) IBW/kg (Calculated) : 59.3   Vital Signs: Temp: 98.8 F (37.1 C) (12/04 0845) Temp Source: Oral (12/04 0845) BP: 128/59 (12/04 0845) Pulse Rate: 83 (12/04 0845)  Labs:  Recent Labs  03/04/16 0324 03/05/16 0427 03/06/16 0314  LABPROT 22.7* 22.8* 25.2*  INR 1.97 1.98 2.24  CREATININE 1.34* 1.36* 1.35*    Estimated Creatinine Clearance: 25.9 mL/min (by C-G formula based on SCr of 1.35 mg/dL (H)).  Assessment: 89yof continues on Coumadin 3.75mg  daily for hx afib and recent DVT. INR 1.71 on admit. Now INR therapeutic and up to 2.24. Has been getting higher dose than PTA Coumadin since admit. Last Hgb 8.2, plts wnl on 11/30.  Goal of Therapy:  INR 2-3 Monitor platelets by anticoagulation protocol: Yes   Plan:  Give Coumadin 3.75mg  Monitor daily INR, CBC, s/s of bleed  [name], PharmD, [facility] Clinical Pharmacist Pager [phone] 03/06/2016 9:43 AM

## 2016-03-06 NOTE — Progress Notes (Signed)
Pt CBG's have been 240-250's at night when receiving her Osmolite, does not have any coverage ordered at night, could u order some coverage to help with this, thanks Arvella Nigh RN

## 2016-03-06 NOTE — Progress Notes (Signed)
Results for Shannon [name], Shannon [name] (MRN [id number]) as of 03/06/2016 12:55  Ref. Range 03/05/2016 20:27 03/06/2016 00:15 03/06/2016 04:09 03/06/2016 06:40 03/06/2016 11:49  Glucose-Capillary Latest Ref Range: 65 - 99 mg/dL 174 (H) 258 (H) 265 (H) 247 (H) 333 (H)   Noted that blood sugars continue to be elevated. Recommend increasing Novolog to MODERATE correction scale TID & HS and add Novolog 3-4 units TID as meal coverage if eats at least 50 % of meals. Will continue to follow blood sugars while in the hospital. Harvel Ricks RN BSN CDE

## 2016-03-06 NOTE — Progress Notes (Signed)
Patient Name: Shannon [name] Date of Encounter: 03/06/2016  Primary Cardiologist: Dr. Rhona Raider Problem List     Active Problems:   Acute on chronic diastolic CHF (congestive heart failure) (HCC)     Subjective   Having left sided chest pain, very SOB. Cannot speak but one word at a time before having to stop to rest.   Inpatient Medications    Scheduled Meds: . amiodarone  200 mg Oral Daily  . atorvastatin  10 mg Oral q1800  . benzonatate  200 mg Oral TID  . Chlorhexidine Gluconate Cloth  6 each Topical Q0600  . cycloSPORINE  1 drop Both Eyes BID  . diltiazem  120 mg Oral Daily  . feeding supplement (ENSURE ENLIVE)  237 mL Oral BID BM  . feeding supplement (OSMOLITE 1.5 CAL)  1,000 mL Per Tube Q24H  . feeding supplement (PRO-STAT SUGAR FREE 64)  30 mL Oral BID BM  . fluticasone  2 spray Each Nare Daily  . free water  200 mL Per Tube Q4H  . furosemide  40 mg Oral Daily  . gabapentin  300 mg Oral TID  . hydrALAZINE  50 mg Oral TID  . insulin aspart  0-9 Units Subcutaneous TID WC  . isosorbide mononitrate  30 mg Oral Daily  . latanoprost  1 drop Both Eyes QHS  . levothyroxine  137 mcg Oral QAC breakfast  . loratadine  10 mg Oral Daily  . mometasone-formoterol  2 puff Inhalation BID  . mupirocin ointment  1 application Nasal BID  . pantoprazole  40 mg Oral BID  . polyvinyl alcohol  1 drop Both Eyes QID  . sertraline  100 mg Oral Daily  . sodium chloride flush  3 mL Intravenous Q12H  . Warfarin - Pharmacist Dosing Inpatient   Does not apply q1800   Continuous Infusions:  PRN Meds: sodium chloride, acetaminophen, albuterol, guaiFENesin-dextromethorphan, HYDROcodone-acetaminophen, ipratropium-albuterol, ondansetron (ZOFRAN) IV, simethicone, sodium chloride flush, traMADol   Vital Signs    Vitals:   03/05/16 2047 03/05/16 2142 03/06/16 0414 03/06/16 0802  BP:  128/63 (!) 114/45   Pulse:   63   Resp:   20   Temp:   98.6 F (37 C)   TempSrc:   Oral   SpO2:  100%  100% 99%  Weight:   140 lb 4.8 oz (63.6 kg)   Height:        Intake/Output Summary (Last 24 hours) at 03/06/16 0827 Last data filed at 03/06/16 0600  Gross per 24 hour  Intake             1710 ml  Output             2350 ml  Net             -640 ml   Filed Weights   03/04/16 0710 03/05/16 0518 03/06/16 0414  Weight: 147 lb 3.2 oz (66.8 kg) 145 lb 3.2 oz (65.9 kg) 140 lb 4.8 oz (63.6 kg)    Physical Exam   GEN: Elderly ill appearing female  HEENT: Grossly normal.  Neck: Supple, no JVD, carotid bruits, or masses. Cardiac: iRRR, no murmurs, rubs, or gallops. No clubbing, cyanosis, edema.  Radials/DP/PT 2+ and equal bilaterally.  Respiratory:  Respirations labored. Diffuse rhonchi, productive cough.  GI: Soft, nontender, nondistended, BS + x 4. GJ tube in place  MS: no deformity or atrophy. Skin: warm and dry, no rash. Neuro:  Strength and sensation are intact. Psych: AAOx3.  Normal affect.  Labs    Basic Metabolic Panel  Recent Labs  03/05/16 0427 03/06/16 0314  NA 128* 128*  K 4.2 3.7  CL 90* 90*  CO2 26 27  GLUCOSE 260* 256*  BUN 46* 48*  CREATININE 1.36* 1.35*  CALCIUM 9.1 8.7*     Telemetry    Aflutter- Personally Reviewed  ECG    Aflutter with variable AV block- Personally Reviewed  Radiology    Dg Chest Port 1v Same Day  Result Date: 03/05/2016 CLINICAL DATA:  CHF EXAM: PORTABLE CHEST 1 VIEW COMPARISON:  03/02/2016 FINDINGS: Stable cardiomegaly without definite CHF. Chronic elevation of the right hemidiaphragm. Increased consolidative left lower lobe opacity obscures the left cardiac border and the left hemidiaphragm suspicious for developing left lower lobe collapse/ consolidation. Pneumonia not excluded. No pneumothorax. Trachea is midline. Atherosclerosis changes of the aorta. Degenerative changes of the spine and shoulders. IMPRESSION: Stable cardiomegaly without definite edema or CHF Increased left lower lobe collapse/consolidation concerning  for developing pneumonia Thoracic aortic atherosclerosis Electronically Signed   By: Jerilynn Mages.  Shick M.D.   On: 03/05/2016 14:13   12/4  FINDINGS: The lungs are reasonably well inflated. The cardiac silhouette remains enlarged. The central pulmonary vascularity is mildly prominent and slightly more conspicuous than on yesterday's study. There is calcification in the wall of the aortic arch. The mediastinum is normal in width. There is no significant pleural effusion. The observed bony thorax is unremarkable.  IMPRESSION: Chronic bronchitic changes. Probable superimposed low-grade CHF. No discrete pneumonia.  Thoracic aortic atherosclerosis. Cardiac Studies   Transthoracic Echocardiography 01/27/16 Study Conclusions  - Left ventricle: The cavity size was normal. Wall thickness was   normal. Systolic function was normal. The estimated ejection   fraction was in the range of 55% to 60%. Wall motion was normal;   there were no regional wall motion abnormalities. Doppler   parameters are consistent with abnormal left ventricular   relaxation (grade 1 diastolic dysfunction). - Aortic valve: There was mild regurgitation. - Mitral valve: There was mild regurgitation. - Left atrium: The atrium was moderately dilated.  Impressions:  - Normal LV systolic function; grade 1 diastolic dysfunction; mild   AI; mild MR; moderate LAE.  Patient Profile     Shannon [name] is a 82 year old female with a past medical history of persistent Afib/flutter on Amiodarone, CKD stage III, DM, HTN, chronic diastolic CHF, CVA, gastric outlet obstruction s/p J tube. Multiple recent hospitalizations most recent in Oct. 2017 for rapid aflutter and acute on chronic diastolic CHF.   Presented to the office on 03/02/16 with 8 pound weight gain in 2 days, BLE edema and orthopnea. Admitted for IV diuresis.   Assessment & Plan    1. Acute on chronic diastolic CHF: Presented with weight gain and volume overload.  Transitioned to po Lasix yesterday. Looks very SOB this am, but I wonder with productive cough and diffuse rhonchi if these symptoms are more representative of PNA (aspiration is a worry as well). Chest X ray yesterday showed LLL consolidation/collapse with no definite edema.   Will get 2 view X ray to better view. Patient also saying that she is having chest pain on the left side. Seems worse when she breaths in.  May need [facility] to take over care with developing PNA and also to help manage J tube.   2. Acute respiratory failure: She does have underlying pulmonary issues with asthmas and COPD. O2 saturations are 100%. Baseline she has always taken deep  gasps of breath when talking. But does seem more acutely SOB this am.  3. Left lower extremity DVT last admission:  She was started on coumadin. No evidence of right heart strain on Echo last admit. Creatinine elevated so CTA might not be an option.   4. Chronic Afib: Rate controlled. On Amiodarone daily.   5. H/o GOO s/p ge-tube:  Dietary consulted to resume enteral feeds  6. Hyponatremia: Improving   Signed, Arbutus Leas, NP  03/06/2016, 8:27 AM   I have seen and examined the patient along with Arbutus Leas, NP .  I have reviewed the chart, notes and new data.  I agree with NP's note.  Key new complaints: appears chronically ill, dyspneic, coughing with yellow secretions Key examination changes: no rales or wheezing, just upper airway rhonchi Key new findings / data: CXR does not show pneumonia, confirms mild vascular congestion:  "The lungs are reasonably well inflated. The cardiac silhouette remains enlarged. The central pulmonary vascularity is mildly prominent and slightly more conspicuous than on yesterday's study. There is calcification in the wall of the aortic arch. The mediastinum is normal in width. There is no significant pleural effusion. The observed bony thorax is unremarkable.  IMPRESSION: Chronic bronchitic changes.  Probable superimposed low-grade CHF. No discrete pneumonia. Thoracic aortic atherosclerosis." BNP 515 is well above her baseline.   PLAN: Confusing presentation with hyponatremia. "Free water" was increased not long ago (maybe in response to elevated creatinine?). Hyperglycemia partly accounts for low Na, but not entirely. CHF decompensation was probably related to NaCl tablets, but what caused initial electrolyte imbalance is puzzling. Still a little "wet" today by CXR and BNP. She takes sertraline, but no change for years. She has moderate renal insufficiency. Could she have SIADH? She is now receiving furosemide so cannot check urine electrolytes. Will diurese to euvolemic status, then DC furosemide and check urine lytes and osmolality in 48 hours.  She is old and frail. Note "partial code" status (no CPR, no intubation, but meds and BiPAP ok).  Sanda Klein, MD, [place] [phone] 03/06/2016, 11:31 AM

## 2016-03-07 LAB — PROTIME-INR
INR: 2.34
Prothrombin Time: 26 seconds — ABNORMAL HIGH (ref 11.4–15.2)

## 2016-03-07 LAB — CBC
HCT: 24.3 % — ABNORMAL LOW (ref 36.0–46.0)
Hemoglobin: 8.1 g/dL — ABNORMAL LOW (ref 12.0–15.0)
MCH: 28.3 pg (ref 26.0–34.0)
MCHC: 33.3 g/dL (ref 30.0–36.0)
MCV: 85 fL (ref 78.0–100.0)
Platelets: 386 10*3/uL (ref 150–400)
RBC: 2.86 MIL/uL — ABNORMAL LOW (ref 3.87–5.11)
RDW: 17.9 % — ABNORMAL HIGH (ref 11.5–15.5)
WBC: 10.2 10*3/uL (ref 4.0–10.5)

## 2016-03-07 LAB — GLUCOSE, CAPILLARY
Glucose-Capillary: 214 mg/dL — ABNORMAL HIGH (ref 65–99)
Glucose-Capillary: 227 mg/dL — ABNORMAL HIGH (ref 65–99)
Glucose-Capillary: 218 mg/dL — ABNORMAL HIGH (ref 65–99)
Glucose-Capillary: 243 mg/dL — ABNORMAL HIGH (ref 65–99)

## 2016-03-07 LAB — BASIC METABOLIC PANEL
Anion gap: 10 (ref 5–15)
BUN: 44 mg/dL — ABNORMAL HIGH (ref 6–20)
CO2: 29 mmol/L (ref 22–32)
Calcium: 8.6 mg/dL — ABNORMAL LOW (ref 8.9–10.3)
Chloride: 86 mmol/L — ABNORMAL LOW (ref 101–111)
Creatinine, Ser: 1.34 mg/dL — ABNORMAL HIGH (ref 0.44–1.00)
GFR calc Af Amer: 39 mL/min — ABNORMAL LOW (ref >60)
GFR calc non Af Amer: 34 mL/min — ABNORMAL LOW (ref >60)
Glucose, Bld: 252 mg/dL — ABNORMAL HIGH (ref 65–99)
Potassium: 3.6 mmol/L (ref 3.5–5.1)
Sodium: 125 mmol/L — ABNORMAL LOW (ref 135–145)

## 2016-03-07 MED ORDER — WARFARIN SODIUM 7.5 MG PO TABS
3.7500 mg | ORAL_TABLET | Freq: Once | ORAL | Status: AC
  Administered 2016-03-07: 3.75 mg via ORAL
  Filled 2016-03-07: qty 0.5

## 2016-03-07 MED ORDER — FREE WATER
200.0000 mL | Freq: Three times a day (TID) | Status: DC
  Administered 2016-03-07 – 2016-03-14 (×21): 200 mL

## 2016-03-07 NOTE — Progress Notes (Signed)
Jettie Booze NP at bedside again, obtained and used G/J tube cleaner. NP also tried to flush G/J tube with sterile water and [employer]. Unsuccessful attempts, NP stated she would consult IR for further evaluation.   Kadie Balestrieri Leory Plowman

## 2016-03-07 NOTE — Progress Notes (Signed)
RN and charge nurse at bedside to flush pt g/j tube. Pt and family adamant that only the J tube portion is to be flushed. Attempted to flush J tube and unable to, meeting resistance. MD on call paged, awaiting call back Shannon [name] Shannon [name]

## 2016-03-07 NOTE — Progress Notes (Addendum)
Inpatient Diabetes Program Recommendations  AACE/ADA: New Consensus Statement on Inpatient Glycemic Control (2015)  Target Ranges:  Prepandial:   less than 140 mg/dL      Peak postprandial:   less than 180 mg/dL (1-2 hours)      Critically ill patients:  140 - 180 mg/dL   Results for [name], [name] (MRN [id number]) as of 03/07/2016 10:32  Ref. Range 03/05/2016 06:21 03/05/2016 11:01 03/05/2016 16:25 03/05/2016 20:27 03/06/2016 00:15 03/06/2016 04:09 03/06/2016 06:40 03/06/2016 11:49 03/06/2016 16:08 03/07/2016 05:56  Glucose-Capillary Latest Ref Range: 65 - 99 mg/dL 248 (H) 217 (H) 209 (H) 174 (H) 258 (H) 265 (H) 247 (H) 333 (H) 164 (H) 214 (H)   Review of Glycemic Control  Diabetes history:No Outpatient Diabetes medications: NA Current orders for Inpatient glycemic control: Novolog 0-9 units TID with meals  Inpatient Diabetes Program Recommendations: Correction (SSI): Please change CBGs and Novolog correction frequency to Q4H. Insulin -Tube Coverage: Please consider ordering Novolog 2 units TID (at 8pm, 12am, and 4am) for tube feeding coverage receiving during the night.  Thanks, Barnie Alderman, RN, MSN, CDE Diabetes Coordinator Inpatient Diabetes Program [phone] (Team Pager from 8am to 5pm)

## 2016-03-07 NOTE — Progress Notes (Signed)
Patient Name: Shannon [name] Date of Encounter: 03/07/2016  Primary Cardiologist: Dr. Rhona Raider Problem List     Active Problems:   Acute on chronic diastolic CHF (congestive heart failure) (HCC)     Subjective   Feels good this morning. Feels stronger.   Inpatient Medications    Scheduled Meds: . amiodarone  200 mg Oral Daily  . atorvastatin  10 mg Oral q1800  . benzonatate  200 mg Oral TID  . cycloSPORINE  1 drop Both Eyes BID  . diltiazem  120 mg Oral Daily  . feeding supplement (ENSURE ENLIVE)  237 mL Oral BID BM  . feeding supplement (OSMOLITE 1.5 CAL)  1,000 mL Per Tube Q24H  . feeding supplement (PRO-STAT SUGAR FREE 64)  30 mL Oral BID BM  . fluticasone  2 spray Each Nare Daily  . free water  200 mL Per Tube Q4H  . furosemide  40 mg Oral Daily  . gabapentin  300 mg Oral TID  . hydrALAZINE  50 mg Oral TID  . insulin aspart  0-9 Units Subcutaneous TID WC  . isosorbide mononitrate  30 mg Oral Daily  . latanoprost  1 drop Both Eyes QHS  . levothyroxine  137 mcg Oral QAC breakfast  . loratadine  10 mg Oral Daily  . mometasone-formoterol  2 puff Inhalation BID  . mupirocin ointment  1 application Nasal BID  . pantoprazole  40 mg Oral BID  . polyvinyl alcohol  1 drop Both Eyes QID  . sertraline  100 mg Oral Daily  . sodium chloride flush  3 mL Intravenous Q12H  . warfarin  3.75 mg Oral ONCE-1800  . Warfarin - Pharmacist Dosing Inpatient   Does not apply q1800   Continuous Infusions:  PRN Meds: sodium chloride, acetaminophen, albuterol, guaiFENesin-dextromethorphan, HYDROcodone-acetaminophen, ipratropium-albuterol, ondansetron (ZOFRAN) IV, simethicone, sodium chloride flush, traMADol   Vital Signs    Vitals:   03/06/16 0845 03/06/16 1155 03/06/16 2100 03/07/16 0738  BP: (!) 128/59 (!) 125/56 (!) 125/44 (!) 128/52  Pulse: 83 97 96 71  Resp: (!) 21 20 20 18   Temp: 98.8 F (37.1 C) 98.6 F (37 C) 99.8 F (37.7 C) 97.9 F (36.6 C)  TempSrc: Oral Oral  Oral Oral  SpO2: 100% 98% 99% 100%  Weight:    140 lb 12.8 oz (63.9 kg)  Height:        Intake/Output Summary (Last 24 hours) at 03/07/16 0924 Last data filed at 03/07/16 9892  Gross per 24 hour  Intake             2855 ml  Output             1130 ml  Net             1725 ml   Filed Weights   03/05/16 0518 03/06/16 0414 03/07/16 0738  Weight: 145 lb 3.2 oz (65.9 kg) 140 lb 4.8 oz (63.6 kg) 140 lb 12.8 oz (63.9 kg)    Physical Exam  GEN: Elderly ill appearing female  HEENT: Grossly normal.  Neck: Supple, no JVD, carotid bruits, or masses. Cardiac: iRRR, no murmurs, rubs, or gallops. No clubbing, cyanosis, edema.  Radials/DP/PT 2+ and equal bilaterally.  Respiratory:  Respirations labored. Diffuse rhonchi, productive cough.  GI: Soft, nontender, nondistended, BS + x 4. GJ tube in place  MS: no deformity or atrophy. Skin: warm and dry, no rash. Neuro:  Strength and sensation are intact. Psych: AAOx3.  Normal affect.  Labs    CBC  Recent Labs  03/06/16 1822 03/07/16 0554  WBC 10.0 10.2  NEUTROABS 7.0  --   HGB 8.6* 8.1*  HCT 25.2* 24.3*  MCV 84.6 85.0  PLT 372 086   Basic Metabolic Panel  Recent Labs  03/06/16 0314 03/07/16 0554  NA 128* 125*  K 3.7 3.6  CL 90* 86*  CO2 27 29  GLUCOSE 256* 252*  BUN 48* 44*  CREATININE 1.35* 1.34*  CALCIUM 8.7* 8.6*    Telemetry    3:1 Atrial flutter - Personally Reviewed  ECG     Aflutter with variable AV block - Personally Reviewed  Radiology    Dg Chest 2 View  Result Date: 03/06/2016 CLINICAL DATA:  Shortness of breath with cough and chest congestion today. History of asthma -COPD, coronary artery disease, nonsmoker. EXAM: CHEST  2 VIEW COMPARISON:  Portable chest x-ray of March 05, 2016 FINDINGS: The lungs are reasonably well inflated. The cardiac silhouette remains enlarged. The central pulmonary vascularity is mildly prominent and slightly more conspicuous than on yesterday's study. There is  calcification in the wall of the aortic arch. The mediastinum is normal in width. There is no significant pleural effusion. The observed bony thorax is unremarkable. IMPRESSION: Chronic bronchitic changes. Probable superimposed low-grade CHF. No discrete pneumonia. Thoracic aortic atherosclerosis. Electronically Signed   By: David  Martinique M.D.   On: 03/06/2016 10:28   Dg Chest Port 1v Same Day  Result Date: 03/05/2016 CLINICAL DATA:  CHF EXAM: PORTABLE CHEST 1 VIEW COMPARISON:  03/02/2016 FINDINGS: Stable cardiomegaly without definite CHF. Chronic elevation of the right hemidiaphragm. Increased consolidative left lower lobe opacity obscures the left cardiac border and the left hemidiaphragm suspicious for developing left lower lobe collapse/ consolidation. Pneumonia not excluded. No pneumothorax. Trachea is midline. Atherosclerosis changes of the aorta. Degenerative changes of the spine and shoulders. IMPRESSION: Stable cardiomegaly without definite edema or CHF Increased left lower lobe collapse/consolidation concerning for developing pneumonia Thoracic aortic atherosclerosis Electronically Signed   By: Jerilynn Mages.  Shick M.D.   On: 03/05/2016 14:13    Cardiac Studies  Transthoracic Echocardiography 01/27/16 Study Conclusions  - Left ventricle: The cavity size was normal. Wall thickness was normal. Systolic function was normal. The estimated ejection fraction was in the range of 55% to 60%. Wall motion was normal; there were no regional wall motion abnormalities. Doppler parameters are consistent with abnormal left ventricular relaxation (grade 1 diastolic dysfunction). - Aortic valve: There was mild regurgitation. - Mitral valve: There was mild regurgitation. - Left atrium: The atrium was moderately dilated.  Impressions:  - Normal LV systolic function; grade 1 diastolic dysfunction; mild AI; mild MR; moderate LAE   Patient Profile   Shannon [name] is a 82 year old female with a past  medical history of persistent Afib/flutter on Amiodarone, CKD stage III, DM, HTN, chronic diastolic CHF, CVA, gastric outlet obstruction s/p J tube. Multiple recent hospitalizations most recent in Oct. 2017 for rapid aflutter and acute on chronic diastolic CHF.   Presented to the office on 03/02/16 with 8 pound weight gain in 2 days, BLE edema and orthopnea. Admitted for IV diuresis   Assessment & Plan  1. Acute on chronic diastolic CHF: Presented with weight gain and volume overload. Transitioned to po Lasix yesterday.   Looks better today, probably getting close to euvolemia.   2. Acute respiratory failure: She does have underlying pulmonary issues with asthmas and COPD. O2 saturations are 100%. Baseline she has always taken deep  gasps of breath when talking.   3. Left lower extremity DVT last admission:  She was started on coumadin. No evidence of right heart strain on Echo last admit. Creatinine elevated so CTA might not be an option.   4. Chronic Afib: Rate controlled. On Amiodarone daily.   5. H/o GOO s/p ge-tube:  Dietary consulted to resume enteral feeds. Wound care consult for G tube site. Nursing reports some purulent drainage.   6. Hyponatremia: worsened   Signed, Arbutus Leas, NP  03/07/2016, 9:24 AM   I have seen and examined the patient along with Arbutus Leas, NP.  I have reviewed the chart, notes and new data.  I agree with NP's note.  Key new complaints: coughing less and breathing better, complains of scratchy, tender eyes with lids sticking together Key examination changes: no obvious eye drainage, mild symmetrical conjunctival erythema; bilateral rhonchi and some inspiratory stridor on the R, no rales/crackles. Key new findings / data: Na 125, stable creatinine  PLAN: I think she is euvolemic or close to it. Worsening Na. Reduce free water. Hold furosemide. Check urine electrolytes and osm in 48 hours. Has hypothyroidism, TSH still a little high, but  markedly improved from August and now with normal FT4, hard to believe it explains low Na. No other evidence for adrenal insufficiency. Consider paraneoplastic SIADH, maybe right bronchogenic Ca? But no mass on CT chest in April 2017. Consider Samsca. Watch for worsening eye irritation - she is on Restasis.  Sanda Klein, MD, [place] [phone] 03/07/2016, 10:28 AM

## 2016-03-07 NOTE — Progress Notes (Signed)
Nutrition Follow-up   INTERVENTION:  Provide Osmolite 1.5 @ 65 ml/hr via J port of GJ tube at rate of 65 ml/hr for 12 hours daily from 2000 hr to 0800 hr.   Provide 30 ml Prostat BID via J-tube.    Tube feeding regimen provides 1370 kcal (100% of needs), 79 grams of protein, and 592 ml of H2O.   Discontinue Ensure Enlive supplements PO due to elevated glucose and good PO intake at meals   NUTRITION DIAGNOSIS:   Inadequate oral intake related to altered GI function as evidenced by other (see comment) (Full liquid diet).  ongoing  GOAL:   Patient will meet greater than or equal to 90% of their needs  Being met/exceeded  MONITOR:   PO intake, TF tolerance, Skin, I & O's, Labs, Weight trends  REASON FOR ASSESSMENT:   Consult Enteral/tube feeding initiation and management  ASSESSMENT:   82 year old female  history of persistent atrial fibrillation on amiodarone, bradycardia, CKD stage III, hypothyroidism, non-obstructive coronary artery disease, type 2 diabetes, hypertension, chronic diastolic CHF, stroke, cerebrovascular disease, COPD, asthma, gastric outlet obstruction s/p prior J tube, anemia. Presents with acute respiratory failure sec to acute on chronic diastolic CHF.  Pt has been receiving her TF at night and tolerating well. Per nursing notes, she has been consuming 480 ml to 600 ml of full liquids at most meals, 240 ml at some. Pt reports tolerating TF well and eating well at meals. Her weight has dropped from 156 lbs to 140 lbs since admission with weight stable to past 2 days. RD discussed discontinuing Ensure supplements due pt's blood glucose being elevated and seeing how her intake at meals is good. Pt is agreeable to discontinuing supplement during the day.   Labs: elevated glucose ranging 164 to 333 mg/dL, low hemoglobin,   Diet Order:  Diet full liquid Room service appropriate? Yes; Fluid consistency: Thin  Skin:  Reviewed, no issues  Last BM:   12/3  Height:   Ht Readings from Last 1 Encounters:  03/02/16 '5\' 6"'  (1.676 m)    Weight:   Wt Readings from Last 1 Encounters:  03/07/16 140 lb 12.8 oz (63.9 kg)    Ideal Body Weight:  59.1 kg  BMI:  Body mass index is 22.73 kg/m.  Estimated Nutritional Needs:   Kcal:  1300-1500  Protein:  70-85 grams  Fluid:  1.5 L/day  EDUCATION NEEDS:   No education needs identified at this time  [place], CSP, LDN Inpatient Clinical Dietitian Pager: [phone] After Hours Pager: [phone]

## 2016-03-07 NOTE — Progress Notes (Signed)
Patient ID: Shannon [name], female   DOB: 10-Jul-1923, 82 y.o.   MRN: [id number]    Referring Physician(s): Dr. Shelly Flatten  Supervising Physician: Oley Balm  Patient Status: [facility] - In-pt  Chief Complaint: Clogged J portion of GJ tube  Subjective: Pt well known to IR for clogging of the J portion of her GJ-tube.  It has reclogged today prior to discharge.  We have been asked to unclog this.  Allergies: Penicillins  Medications: Prior to Admission medications   Medication Sig Start Date End Date Taking? Authorizing Provider  acetaminophen (TYLENOL) 325 MG tablet Take 650 mg by mouth 2 (two) times daily as needed for mild pain.    Yes Historical Provider, MD  ADVAIR DISKUS 250-50 MCG/DOSE AEPB INHALE 1 PUFF INTO THE LUNGS 2 (TWO) TIMES DAILY AS NEEDED FOR SHORTNESS OF BREATH 12/01/14  Yes Historical Provider, MD  AMINO ACIDS-PROTEIN HYDROLYS PO Take 30 mLs by mouth 2 (two) times daily.   Yes Historical Provider, MD  amiodarone (PACERONE) 200 MG tablet Take 200 mg by mouth daily.   Yes Historical Provider, MD  amLODipine (NORVASC) 10 MG tablet TAKE 1 TABLET BY MOUTH DAILY FOR HYPERTENSION 02/21/16  Yes Kirt Boys, DO  atorvastatin (LIPITOR) 10 MG tablet Take 10 mg by mouth daily.   Yes Historical Provider, MD  b complex vitamins tablet Take 1 tablet by mouth daily.    Yes Historical Provider, MD  bisacodyl (DULCOLAX) 10 MG suppository Place 1 suppository (10 mg total) rectally daily as needed for moderate constipation. 09/30/15  Yes Calvert Cantor, MD  calcium-vitamin D (OSCAL WITH D) 500-200 MG-UNIT per tablet Take 1 tablet by mouth daily with breakfast.    Yes Historical Provider, MD  cefUROXime (CEFTIN) 250 MG tablet Take 250 mg by mouth 2 (two) times daily. 02/28/16  Yes Historical Provider, MD  cetirizine (ZYRTEC) 10 MG tablet Take 10 mg by mouth daily as needed for allergies or rhinitis.   Yes Historical Provider, MD  chlorhexidine (PERIDEX) 0.12 % solution Use as directed 15 mLs  in the mouth or throat 2 (two) times daily.   Yes Historical Provider, MD  diltiazem (CARDIZEM CD) 120 MG 24 hr capsule Take 1 capsule (120 mg total) by mouth daily. 02/23/16 05/23/16 Yes Hillis Range, MD  feeding supplement (BOOST / RESOURCE BREEZE) LIQD Take 1 Container by mouth daily. 11/11/15  Yes Rodolph Bong, MD  fluticasone [facility]) 50 MCG/ACT nasal spray Place 2 sprays into both nostrils daily. 08/07/14  Yes Kirt Boys, DO  gabapentin (NEURONTIN) 300 MG capsule Take 1 capsule (300 mg total) by mouth 3 (three) times daily. 02/02/16  Yes Little Ishikawa, NP  hydrALAZINE (APRESOLINE) 50 MG tablet Take 1 tablet (50 mg total) by mouth 3 (three) times daily. 11/11/15  Yes Rodolph Bong, MD  HYDROcodone-acetaminophen (NORCO) 5-325 MG tablet Take 1 tablet by mouth every 6 (six) hours as needed for moderate pain. 02/18/16  Yes Monica Carter, DO  ipratropium-albuterol (DUONEB) 0.5-2.5 (3) MG/3ML SOLN Take 3 mLs by nebulization every 6 (six) hours as needed (sob). Use 3 times daily x 4 days then every 6 hours as needed. 11/11/15  Yes Rodolph Bong, MD  isosorbide mononitrate (IMDUR) 30 MG 24 hr tablet TAKE 1 TABLET (30 MG TOTAL) BY MOUTH DAILY. 06/22/15  Yes Historical Provider, MD  latanoprost (XALATAN) 0.005 % ophthalmic solution Place 1 drop into both eyes at bedtime. 06/02/14  Yes Oneal Grout, MD  levalbuterol [facility] HFA) 45 MCG/ACT inhaler Inhale 2  puffs into the lungs every 6 (six) hours as needed for wheezing or shortness of breath. 02/03/16  Yes Kirt Boys, DO  levothyroxine (SYNTHROID, LEVOTHROID) 137 MCG tablet Take 1 tablet (137 mcg total) by mouth daily before breakfast. 12/27/15  Yes Kirt Boys, DO  Maltodextrin-Xanthan Gum (RESOURCE THICKENUP CLEAR) POWD Take 120 g by mouth as needed (use to thicken liquids.). 11/11/15  Yes Rodolph Bong, MD  Multiple Vitamin (MULTIVITAMIN WITH MINERALS) TABS tablet Take 1 tablet by mouth daily.   Yes Historical Provider, MD  Nutritional  Supplements (FEEDING SUPPLEMENT, OSMOLITE 1.5 CAL,) LIQD Place 1,000 mLs into feeding tube daily. 09/30/15  Yes Calvert Cantor, MD  pantoprazole (PROTONIX) 40 MG tablet TAKE 1 TABLET BY MOUTH TWICE A DAY 08/11/15  Yes Kirt Boys, DO  Polyethyl Glycol-Propyl Glycol 0.4-0.3 % SOLN Place 1 drop into both eyes 4 (four) times daily.    Yes Historical Provider, MD  polyethylene glycol powder (GLYCOLAX/MIRALAX) powder MIX 17GRAMS IN 8 OUNCES OF LIQUID AND DRINK TWICE DAILY AS NEEDED 09/13/15  Yes Monica Carter, DO  RESTASIS 0.05 % ophthalmic emulsion USE 1 DROP INTO BOTH EYES TWICE DAILY 11/01/15  Yes Kimber Relic, MD  sertraline (ZOLOFT) 100 MG tablet Take one tablet by mouth once daily for depression 09/03/15  Yes Kirt Boys, DO  sodium chloride 1 g tablet Take 1 tablet (1 g total) by mouth 2 (two) times daily with a meal. 02/28/16  Yes Gilda Crease, MD  traMADol (ULTRAM) 50 MG tablet Take 50 mg by mouth daily as needed for moderate pain.    Yes Historical Provider, MD  warfarin (COUMADIN) 2.5 MG tablet Take as directed by [facility]. Patient taking differently: Take 3.75 mg by mouth daily at 6 PM. Take as directed by [facility]. 02/16/16  Yes Lars Masson, MD  Water For Irrigation, Sterile (FREE WATER) SOLN Place 230 mLs into feeding tube 3 (three) times daily.   Yes Historical Provider, MD    Vital Signs: BP (!) 128/52 (BP Location: Right Arm)   Pulse 71   Temp 97.9 F (36.6 C) (Oral)   Resp 18   Ht 5\' 6"  (1.676 m)   Wt 140 lb 12.8 oz (63.9 kg)   SpO2 100% Comment: decreased to 3lpm   BMI 22.73 kg/m   Physical Exam: Abd: J tube accessed and using a 10cc NS flush the J-tube was flushed.  This unclogged her tube.  It then flushed well after that to confirm the problem had been resolved.  Imaging: Dg Chest 2 View  Result Date: 03/06/2016 CLINICAL DATA:  Shortness of breath with cough and chest congestion today. History of asthma -COPD, coronary artery disease,  nonsmoker. EXAM: CHEST  2 VIEW COMPARISON:  Portable chest x-ray of March 05, 2016 FINDINGS: The lungs are reasonably well inflated. The cardiac silhouette remains enlarged. The central pulmonary vascularity is mildly prominent and slightly more conspicuous than on yesterday's study. There is calcification in the wall of the aortic arch. The mediastinum is normal in width. There is no significant pleural effusion. The observed bony thorax is unremarkable. IMPRESSION: Chronic bronchitic changes. Probable superimposed low-grade CHF. No discrete pneumonia. Thoracic aortic atherosclerosis. Electronically Signed   By: David  [place] M.D.   On: 03/06/2016 10:28   Dg Chest Port 1v Same Day  Result Date: 03/05/2016 CLINICAL DATA:  CHF EXAM: PORTABLE CHEST 1 VIEW COMPARISON:  03/02/2016 FINDINGS: Stable cardiomegaly without definite CHF. Chronic elevation of the right hemidiaphragm. Increased consolidative left lower  lobe opacity obscures the left cardiac border and the left hemidiaphragm suspicious for developing left lower lobe collapse/ consolidation. Pneumonia not excluded. No pneumothorax. Trachea is midline. Atherosclerosis changes of the aorta. Degenerative changes of the spine and shoulders. IMPRESSION: Stable cardiomegaly without definite edema or CHF Increased left lower lobe collapse/consolidation concerning for developing pneumonia Thoracic aortic atherosclerosis Electronically Signed   By: Judie Petit.  Shick M.D.   On: 03/05/2016 14:13    Labs:  CBC:  Recent Labs  02/28/16 0522 02/28/16 0539 03/02/16 1210 03/06/16 1822 03/07/16 0554  WBC 4.3  --  5.7 10.0 10.2  HGB 8.3* 9.2* 8.2* 8.6* 8.1*  HCT 23.8* 27.0* 23.9* 25.2* 24.3*  PLT 271  --  285 372 386    COAGS:  Recent Labs  01/27/16 1235 01/27/16 2001 01/28/16 0324  02/08/16 0340  03/04/16 0324 03/05/16 0427 03/06/16 0314 03/07/16 0554  INR 1.20  --  1.20  < > 4.42*  < > 1.97 1.98 2.24 2.34  APTT 66* 88* 74*  --  52*  --   --   --    --   --   < > = values in this interval not displayed.  BMP:  Recent Labs  03/04/16 0324 03/05/16 0427 03/06/16 0314 03/07/16 0554  NA 126* 128* 128* 125*  K 3.9 4.2 3.7 3.6  CL 90* 90* 90* 86*  CO2 25 26 27 29   GLUCOSE 229* 260* 256* 252*  BUN 38* 46* 48* 44*  CALCIUM 9.1 9.1 8.7* 8.6*  CREATININE 1.34* 1.36* 1.35* 1.34*  GFRNONAA 34* 33* 33* 34*  GFRAA 39* 38* 39* 39*    LIVER FUNCTION TESTS:  Recent Labs  11/06/15 1612 01/24/16 1633 02/07/16 2118 03/02/16 1210  BILITOT 0.5 0.3 0.5 0.5  AST 26 39 24 30  ALT 23 26 41 30  ALKPHOS 49 68 59 61  PROT 7.6 8.1 7.5 6.4*  ALBUMIN 3.6 3.8 3.8 3.4*    Assessment and Plan: 1. Clogged GJ-tube This was unclogged and now working well.  May resume use of this tube. No crushed med down this tube and make sure to flush the tube right after TFs disconnected.  Electronically Signed: Letha Cape 03/07/2016, 3:58 PM   I spent a total of 15 Minutes at the the patient's bedside AND on the patient's hospital floor or unit, greater than 50% of which was counseling/coordinating care for clogged GJ-tube

## 2016-03-07 NOTE — Progress Notes (Signed)
ANTICOAGULATION CONSULT NOTE - Follow Up Consult  Pharmacy Consult for Coumadin Indication: Afib/Aflutter  Patient Measurements: Height: 5\' 6"  (167.6 cm) Weight: 140 lb 12.8 oz (63.9 kg) IBW/kg (Calculated) : 59.3   Vital Signs: Temp: 97.9 F (36.6 C) (12/05 0738) Temp Source: Oral (12/05 0738) BP: 128/52 (12/05 0738) Pulse Rate: 71 (12/05 0738)  Labs:  Recent Labs  03/05/16 0427 03/06/16 0314 03/06/16 1822 03/07/16 0554  HGB  --   --  8.6* 8.1*  HCT  --   --  25.2* 24.3*  PLT  --   --  372 386  LABPROT 22.8* 25.2*  --  26.0*  INR 1.98 2.24  --  2.34  CREATININE 1.36* 1.35*  --  1.34*    Estimated Creatinine Clearance: 26.1 mL/min (by C-G formula based on SCr of 1.34 mg/dL (H)).  Assessment: 89yof continues on Coumadin 3.75mg  daily for hx afib and recent DVT. INR 1.71 on admit. Now INR therapeutic and up to 2.34. Had been getting higher dose than PTA Coumadin while inpatient until 12/4. Last Hgb low but stable at 8.1, plts wnl.  Goal of Therapy:  INR 2-3 Monitor platelets by anticoagulation protocol: Yes   Plan:  Give Coumadin 3.75mg  Monitor daily INR, CBC, s/s of bleed  [name], PharmD, [facility] Clinical Pharmacist Pager [phone] 03/07/2016 8:39 AM

## 2016-03-07 NOTE — Consult Note (Signed)
[place] Nurse wound consult note Reason for Consult:  PEG tube drainage Wound type:  Gastrostomy site Drainage (amount, consistency, odor) yellow, no odor Dressing procedure/placement/frequency: Add silver hydrofiber around the PEG tube site, cut like drain sponge, cover with dry dressing. Secure with tape. Change every other day.  Discussed POC with patient and bedside nurse.  Re consult if needed, will not follow at this time. Thanks  Arbutus Nelligan [employer], RN,CWOCN, CNS [phone])

## 2016-03-08 DIAGNOSIS — E871 Hypo-osmolality and hyponatremia: Secondary | ICD-10-CM

## 2016-03-08 LAB — BASIC METABOLIC PANEL
Anion gap: 12 (ref 5–15)
BUN: 47 mg/dL — ABNORMAL HIGH (ref 6–20)
CO2: 28 mmol/L (ref 22–32)
Calcium: 8.7 mg/dL — ABNORMAL LOW (ref 8.9–10.3)
Chloride: 87 mmol/L — ABNORMAL LOW (ref 101–111)
Creatinine, Ser: 1.33 mg/dL — ABNORMAL HIGH (ref 0.44–1.00)
GFR calc Af Amer: 39 mL/min — ABNORMAL LOW (ref >60)
GFR calc non Af Amer: 34 mL/min — ABNORMAL LOW (ref >60)
Glucose, Bld: 244 mg/dL — ABNORMAL HIGH (ref 65–99)
Potassium: 3.8 mmol/L (ref 3.5–5.1)
Sodium: 127 mmol/L — ABNORMAL LOW (ref 135–145)

## 2016-03-08 LAB — PROTIME-INR
INR: 1.84
Prothrombin Time: 21.5 seconds — ABNORMAL HIGH (ref 11.4–15.2)

## 2016-03-08 LAB — GLUCOSE, CAPILLARY
Glucose-Capillary: 317 mg/dL — ABNORMAL HIGH (ref 65–99)
Glucose-Capillary: 231 mg/dL — ABNORMAL HIGH (ref 65–99)
Glucose-Capillary: 188 mg/dL — ABNORMAL HIGH (ref 65–99)
Glucose-Capillary: 202 mg/dL — ABNORMAL HIGH (ref 65–99)

## 2016-03-08 MED ORDER — ERYTHROMYCIN 5 MG/GM OP OINT
TOPICAL_OINTMENT | Freq: Four times a day (QID) | OPHTHALMIC | Status: DC
  Administered 2016-03-08 – 2016-03-12 (×18): via OPHTHALMIC
  Administered 2016-03-12: 1 via OPHTHALMIC
  Administered 2016-03-13: 06:00:00 via OPHTHALMIC
  Filled 2016-03-08: qty 3.5

## 2016-03-08 MED ORDER — WARFARIN SODIUM 5 MG PO TABS
5.0000 mg | ORAL_TABLET | Freq: Once | ORAL | Status: AC
  Administered 2016-03-08: 5 mg via ORAL
  Filled 2016-03-08: qty 1

## 2016-03-08 NOTE — Progress Notes (Signed)
Patient Name: Shannon [name] Date of Encounter: 03/08/2016  Primary Cardiologist:   Hospital Problem List     Active Problems:   Acute on chronic diastolic CHF (congestive heart failure) ([place])     Subjective   Feels ok today. Denies chest pain and SOB. Her eyes are bothering her.   Inpatient Medications    Scheduled Meds: . amiodarone  200 mg Oral Daily  . atorvastatin  10 mg Oral q1800  . benzonatate  200 mg Oral TID  . cycloSPORINE  1 drop Both Eyes BID  . diltiazem  120 mg Oral Daily  . erythromycin   Both Eyes Q6H  . feeding supplement (OSMOLITE 1.5 CAL)  1,000 mL Per Tube Q24H  . feeding supplement (PRO-STAT SUGAR FREE 64)  30 mL Oral BID BM  . fluticasone  2 spray Each Nare Daily  . free water  200 mL Per Tube Q8H  . gabapentin  300 mg Oral TID  . hydrALAZINE  50 mg Oral TID  . insulin aspart  0-9 Units Subcutaneous TID WC  . isosorbide mononitrate  30 mg Oral Daily  . latanoprost  1 drop Both Eyes QHS  . levothyroxine  137 mcg Oral QAC breakfast  . loratadine  10 mg Oral Daily  . mometasone-formoterol  2 puff Inhalation BID  . pantoprazole  40 mg Oral BID  . polyvinyl alcohol  1 drop Both Eyes QID  . sertraline  100 mg Oral Daily  . sodium chloride flush  3 mL Intravenous Q12H  . warfarin  5 mg Oral ONCE-1800  . Warfarin - Pharmacist Dosing Inpatient   Does not apply q1800   Continuous Infusions:  PRN Meds: sodium chloride, acetaminophen, albuterol, guaiFENesin-dextromethorphan, HYDROcodone-acetaminophen, ipratropium-albuterol, ondansetron (ZOFRAN) IV, simethicone, sodium chloride flush, traMADol   Vital Signs    Vitals:   03/07/16 1021 03/07/16 2019 03/08/16 0455 03/08/16 0842  BP:  (!) 111/53 (!) 125/59   Pulse:  70 73   Resp:  18 19   Temp:  99.1 F (37.3 C) 98.4 F (36.9 C)   TempSrc:  Oral Oral   SpO2: 100% 99% 98% 98%  Weight:   144 lb 8 oz (65.5 kg)   Height:        Intake/Output Summary (Last 24 hours) at 03/08/16 1027 Last data filed  at 03/08/16 0947  Gross per 24 hour  Intake             3558 ml  Output             1301 ml  Net             2257 ml   Filed Weights   03/06/16 0414 03/07/16 0738 03/08/16 0455  Weight: 140 lb 4.8 oz (63.6 kg) 140 lb 12.8 oz (63.9 kg) 144 lb 8 oz (65.5 kg)    Physical Exam     NKN:LZJQBHA ill appearing female  HEENT:Grossly normal.  Neck:Supple, no JVD, carotid bruits, or masses. Cardiac:iRRR, no murmurs, rubs, or gallops. No clubbing, cyanosis, edema. Radials/DP/PT 2+ and equal bilaterally.  Respiratory:Respirations labored. Diffuse rhonchi, productive cough.  LP:FXTK, nontender, nondistended, BS + x 4. GJ tube in place  MS:no deformity or atrophy. Skin:warm and dry, no rash. Neuro:Strength and sensation are intact. Psych:AAOx3. Normal affect. Labs    CBC  Recent Labs  03/06/16 1822 03/07/16 0554  WBC 10.0 10.2  NEUTROABS 7.0  --   HGB 8.6* 8.1*  HCT 25.2* 24.3*  MCV 84.6 85.0  PLT  372 226   Basic Metabolic Panel  Recent Labs  03/06/16 0314 03/07/16 0554  NA 128* 125*  K 3.7 3.6  CL 90* 86*  CO2 27 29  GLUCOSE 256* 252*  BUN 48* 44*  CREATININE 1.35* 1.34*  CALCIUM 8.7* 8.6*     Telemetry    3:1 Atrial Flutter- Personally Reviewed   Radiology    No results found.  Cardiac Studies  Transthoracic Echocardiography 01/27/16 Study Conclusions  - Left ventricle: The cavity size was normal. Wall thickness was normal. Systolic function was normal. The estimated ejection fraction was in the range of 55% to 60%. Wall motion was normal; there were no regional wall motion abnormalities. Doppler parameters are consistent with abnormal left ventricular relaxation (grade 1 diastolic dysfunction). - Aortic valve: There was mild regurgitation. - Mitral valve: There was mild regurgitation. - Left atrium: The atrium was moderately dilated.  Impressions:  - Normal LV systolic function; grade 1 diastolic dysfunction; mild AI;  mild MR; moderate LAE   Patient Profile  Shannon [name] is a 82 year old female with a past medical history of persistent Afib/flutter on Amiodarone, CKD stage III, DM, HTN, chronic diastolic CHF, CVA, gastric outlet obstruction s/p J tube. Multiple recent hospitalizations most recent in Oct. 2017 for rapid aflutter and acute on chronic diastolic CHF.   Presented to the office on 03/02/16 with 8 pound weight gain in 2 days, BLE edema and orthopnea. Admitted for IV diuresis      Assessment & Plan    1.Acute on chronic diastolic CHF: Presented with weight gain and volume overload, euvolemic after diuresis.   2. Acute respiratory failure: She does have underlying pulmonary issues with asthmas and COPD. O2 saturations are 100%. Baseline she has always taken deep gasps of breath when talking.   3. Left lower extremity DVT last admission: She was started on coumadin. No evidence of right heart strain on Echo last admit.   4. Chronic Afib: Rate controlled. On Amiodarone daily.   5. H/o GOO s/p ge-tube: Dietary consulted to resume enteral feeds. Appreciate wound care reccommedations.    6. Hyponatremia: Will order urine creatinine and sodium tomorrow (she got lasix yesterday).   Signed, Arbutus Leas, NP  03/08/2016, 10:27 AM   I have seen and examined the patient along with Arbutus Leas, NP.  I have reviewed the chart, notes and new data.  I agree with NP's note.  Key new complaints: no dyspnea, cough a little better, eyes sting Key examination changes: conjunctival erythema, no rales Key new findings / data: Na a little better, creat unchanged  PLAN: Appears euvolemic. Holding diuretics. Check urine and serum labs tomorrow.  Sanda Klein, MD, [place] [phone] 03/08/2016, 2:46 PM

## 2016-03-08 NOTE — Progress Notes (Signed)
ANTICOAGULATION CONSULT NOTE - Follow Up Consult  Pharmacy Consult for Coumadin Indication: Afib/Aflutter  Patient Measurements: Height: 5\' 6"  (167.6 cm) Weight: 144 lb 8 oz (65.5 kg) IBW/kg (Calculated) : 59.3   Vital Signs: Temp: 98.4 F (36.9 C) (12/06 0455) Temp Source: Oral (12/06 0455) BP: 125/59 (12/06 0455) Pulse Rate: 73 (12/06 0455)  Labs:  Recent Labs  03/06/16 0314 03/06/16 1822 03/07/16 0554 03/08/16 0425  HGB  --  8.6* 8.1*  --   HCT  --  25.2* 24.3*  --   PLT  --  372 386  --   LABPROT 25.2*  --  26.0* 21.5*  INR 2.24  --  2.34 1.84  CREATININE 1.35*  --  1.34*  --     Estimated Creatinine Clearance: 26.1 mL/min (by C-G formula based on SCr of 1.34 mg/dL (H)).  Assessment: 89yof continues on Coumadin 3.75mg  daily for hx afib and recent DVT. INR 1.71 on admit. Has needed several higher doses than PTA Coumadin while inpatient. Now INR went down to 1.84 this am. Last Hgb low but stable at 8.1, plts wnl.  Goal of Therapy:  INR 2-3 Monitor platelets by anticoagulation protocol: Yes   Plan:  Give Coumadin 5mg  PO x 1 tonight Monitor daily INR, CBC, s/s of bleed  [name], PharmD, [facility] Clinical Pharmacist Pager [phone] 03/08/2016 8:38 AM

## 2016-03-08 NOTE — Progress Notes (Signed)
Inpatient Diabetes Program Recommendations  AACE/ADA: New Consensus Statement on Inpatient Glycemic Control (2015)  Target Ranges:  Prepandial:   less than 140 mg/dL      Peak postprandial:   less than 180 mg/dL (1-2 hours)      Critically ill patients:  140 - 180 mg/dL  Results for CHLORIS, MARCOUX (MRN [id number]) as of 03/08/2016 08:17  Ref. Range 03/07/2016 05:56 03/07/2016 11:24 03/07/2016 16:54 03/07/2016 21:43 03/08/2016 05:58  Glucose-Capillary Latest Ref Range: 65 - 99 mg/dL 214 (H) 227 (H) 218 (H) 243 (H) 317 (H)    Review of Glycemic Control  Diabetes history:No Outpatient Diabetes medications: NA Current orders for Inpatient glycemic control: Novolog 0-9 units TID with meals  Inpatient Diabetes Program Recommendations: Correction (SSI): Please change CBGs and Novolog correction frequency to Q4H and increase to Moderate scale. Insulin -Tube Coverage: Please consider ordering Novolog 3 units TID (at 8pm, 12am, and 4am) for tube feeding coverage receiving during the night  Thanks, Barnie Alderman, RN, MSN, CDE Diabetes Coordinator Inpatient Diabetes Program [phone] (Team Pager from 8am to 5pm)

## 2016-03-09 ENCOUNTER — Other Ambulatory Visit: Payer: Self-pay | Admitting: Internal Medicine

## 2016-03-09 LAB — BASIC METABOLIC PANEL
Anion gap: 10 (ref 5–15)
BUN: 46 mg/dL — ABNORMAL HIGH (ref 6–20)
CO2: 28 mmol/L (ref 22–32)
Calcium: 8.4 mg/dL — ABNORMAL LOW (ref 8.9–10.3)
Chloride: 87 mmol/L — ABNORMAL LOW (ref 101–111)
Creatinine, Ser: 1.26 mg/dL — ABNORMAL HIGH (ref 0.44–1.00)
GFR calc Af Amer: 42 mL/min — ABNORMAL LOW (ref >60)
GFR calc non Af Amer: 36 mL/min — ABNORMAL LOW (ref >60)
Glucose, Bld: 223 mg/dL — ABNORMAL HIGH (ref 65–99)
Potassium: 4.2 mmol/L (ref 3.5–5.1)
Sodium: 125 mmol/L — ABNORMAL LOW (ref 135–145)

## 2016-03-09 LAB — CBC
HCT: 24 % — ABNORMAL LOW (ref 36.0–46.0)
Hemoglobin: 8.2 g/dL — ABNORMAL LOW (ref 12.0–15.0)
MCH: 28.8 pg (ref 26.0–34.0)
MCHC: 34.2 g/dL (ref 30.0–36.0)
MCV: 84.2 fL (ref 78.0–100.0)
Platelets: 407 10*3/uL — ABNORMAL HIGH (ref 150–400)
RBC: 2.85 MIL/uL — ABNORMAL LOW (ref 3.87–5.11)
RDW: 17.8 % — ABNORMAL HIGH (ref 11.5–15.5)
WBC: 9.8 10*3/uL (ref 4.0–10.5)

## 2016-03-09 LAB — GLUCOSE, CAPILLARY
Glucose-Capillary: 119 mg/dL — ABNORMAL HIGH (ref 65–99)
Glucose-Capillary: 185 mg/dL — ABNORMAL HIGH (ref 65–99)
Glucose-Capillary: 206 mg/dL — ABNORMAL HIGH (ref 65–99)
Glucose-Capillary: 238 mg/dL — ABNORMAL HIGH (ref 65–99)
Glucose-Capillary: 268 mg/dL — ABNORMAL HIGH (ref 65–99)

## 2016-03-09 LAB — CREATININE, URINE, RANDOM: Creatinine, Urine: 58.8 mg/dL

## 2016-03-09 LAB — PROTIME-INR
INR: 2.25
Prothrombin Time: 25.3 seconds — ABNORMAL HIGH (ref 11.4–15.2)

## 2016-03-09 LAB — SODIUM, URINE, RANDOM: Sodium, Ur: 21 mmol/L

## 2016-03-09 MED ORDER — FUROSEMIDE 10 MG/ML IJ SOLN
40.0000 mg | Freq: Two times a day (BID) | INTRAMUSCULAR | Status: DC
Start: 2016-03-09 — End: 2016-03-14
  Administered 2016-03-09 – 2016-03-14 (×10): 40 mg via INTRAVENOUS
  Filled 2016-03-09 (×10): qty 4

## 2016-03-09 MED ORDER — WARFARIN SODIUM 5 MG PO TABS
5.0000 mg | ORAL_TABLET | Freq: Once | ORAL | Status: AC
  Administered 2016-03-09: 5 mg via ORAL
  Filled 2016-03-09: qty 1

## 2016-03-09 NOTE — Progress Notes (Signed)
Inpatient Diabetes Program Recommendations  AACE/ADA: New Consensus Statement on Inpatient Glycemic Control (2015)  Target Ranges:  Prepandial:   less than 140 mg/dL      Peak postprandial:   less than 180 mg/dL (1-2 hours)      Critically ill patients:  140 - 180 mg/dL   Lab Results  Component Value Date   GLUCAP 268 (H) 03/09/2016   HGBA1C 6.5 (H) 08/28/2015    Review of Glycemic Control Results for Shannon [name], Shannon [name] (MRN [id number]) as of 03/09/2016 11:03  Ref. Range 03/08/2016 11:31 03/08/2016 16:38 03/08/2016 21:48 03/09/2016 00:33 03/09/2016 06:30  Glucose-Capillary Latest Ref Range: 65 - 99 mg/dL 231 (H) 188 (H) 202 (H) 238 (H) 268 (H)   Diabetes history:No Outpatient Diabetes medications: NA Current orders for Inpatient glycemic control: Novolog 0-9 units TID with meals  Inpatient Diabetes Program Recommendations: Correction (SSI): Please change CBGs and Novolog correction frequency to Q4H and increase to Moderate scale. Insulin -Tube Coverage: Please consider ordering Novolog 3 units TID (at 8pm, 12am, and 4am) for tube feeding coverage receiving during the night  Thank you, [name], RN, MSN, CDE Inpatient Glycemic Control Team Team Pager [phone] (8am-5pm) 03/09/2016 11:04 AM

## 2016-03-09 NOTE — Progress Notes (Signed)
Patient Name: Shannon [name] Date of Encounter: 03/09/2016  Primary Cardiologist: Dr. Rhona Raider Problem List     Active Problems:   Acute on chronic diastolic CHF (congestive heart failure) (HCC)     Subjective   Feels SOB this morning, says she doesn't feel well.  Inpatient Medications    Scheduled Meds: . amiodarone  200 mg Oral Daily  . atorvastatin  10 mg Oral q1800  . benzonatate  200 mg Oral TID  . cycloSPORINE  1 drop Both Eyes BID  . diltiazem  120 mg Oral Daily  . erythromycin   Both Eyes Q6H  . feeding supplement (OSMOLITE 1.5 CAL)  1,000 mL Per Tube Q24H  . feeding supplement (PRO-STAT SUGAR FREE 64)  30 mL Oral BID BM  . fluticasone  2 spray Each Nare Daily  . free water  200 mL Per Tube Q8H  . gabapentin  300 mg Oral TID  . hydrALAZINE  50 mg Oral TID  . insulin aspart  0-9 Units Subcutaneous TID WC  . isosorbide mononitrate  30 mg Oral Daily  . latanoprost  1 drop Both Eyes QHS  . levothyroxine  137 mcg Oral QAC breakfast  . loratadine  10 mg Oral Daily  . mometasone-formoterol  2 puff Inhalation BID  . pantoprazole  40 mg Oral BID  . polyvinyl alcohol  1 drop Both Eyes QID  . sertraline  100 mg Oral Daily  . sodium chloride flush  3 mL Intravenous Q12H  . warfarin  5 mg Oral ONCE-1800  . Warfarin - Pharmacist Dosing Inpatient   Does not apply q1800   Continuous Infusions:  PRN Meds: sodium chloride, acetaminophen, albuterol, guaiFENesin-dextromethorphan, HYDROcodone-acetaminophen, ipratropium-albuterol, ondansetron (ZOFRAN) IV, simethicone, sodium chloride flush, traMADol   Vital Signs    Vitals:   03/08/16 0842 03/08/16 1132 03/08/16 1944 03/09/16 0444  BP:  (!) 124/58 (!) 128/57 (!) 122/58  Pulse:  84 100 76  Resp:  20 20 18   Temp:  98.4 F (36.9 C) 99.4 F (37.4 C) 98.3 F (36.8 C)  TempSrc:  Oral Oral Oral  SpO2: 98% 97% 100% 100%  Weight:    142 lb 3.2 oz (64.5 kg)  Height:        Intake/Output Summary (Last 24 hours) at  03/09/16 0836 Last data filed at 03/08/16 2043  Gross per 24 hour  Intake             1788 ml  Output              601 ml  Net             1187 ml   Filed Weights   03/07/16 0738 03/08/16 0455 03/09/16 0444  Weight: 140 lb 12.8 oz (63.9 kg) 144 lb 8 oz (65.5 kg) 142 lb 3.2 oz (64.5 kg)    Physical Exam    JAS:[id number] ill appearing female  HEENT:Grossly normal.  Neck:Supple, no JVD, carotid bruits, or masses. Cardiac:iRRR, no murmurs, rubs, or gallops. No clubbing, cyanosis, edema. Radials/DP/PT 2+ and equal bilaterally.  Respiratory:Respirations labored. Diffuse rhonchi, productive cough.  BH:ALPF, nontender, nondistended, BS + x 4. GJ tube in place  MS:no deformity or atrophy. Skin:warm and dry, no rash. Neuro:Strength and sensation are intact. Psych:AAOx3. Normal affect.  Labs    CBC  Recent Labs  03/06/16 1822 03/07/16 0554  WBC 10.0 10.2  NEUTROABS 7.0  --   HGB 8.6* 8.1*  HCT 25.2* 24.3*  MCV 84.6 85.0  PLT 372 219   Basic Metabolic Panel  Recent Labs  03/07/16 0554 03/08/16 1027  NA 125* 127*  K 3.6 3.8  CL 86* 87*  CO2 29 28  GLUCOSE 252* 244*  BUN 44* 47*  CREATININE 1.34* 1.33*  CALCIUM 8.6* 8.7*    Telemetry    Atrial flutter  - Personally Reviewed    Radiology    No results found.    Patient Profile  Shannon [name] is a 82 year old female with a past medical history of persistent Afib/flutter on Amiodarone, CKD stage III, DM, HTN, chronic diastolic CHF, CVA, gastric outlet obstruction s/p J tube. Multiple recent hospitalizations most recent in Oct. 2017 for rapid aflutter and acute on chronic diastolic CHF.   Presented to the office on 03/02/16 with 8 pound weight gain in 2 days, BLE edema and orthopnea. Admitted for IV diuresis     Assessment & Plan    1.Acute on chronic diastolic CHF: Feels SOB this am, says she feels "short winded" diuretics were held the past 2 days, likely has some volume on board now but weight is  still down. MD to advise on diuresis in the setting of hyponatremia.   2. Acute respiratory failure: Stable. She does have underlying pulmonary issues with asthmas and COPD. O2 saturations are 100%. Baseline she has always taken deep gasps of breath when talking.   3. Left lower extremity DVT last admission: Stable.She was started on coumadin. No evidence of right heart strain on Echo last admit.   4. Chronic Afib: Rate controlled. On Amiodarone daily.   5. H/o GOO s/p ge-tube:  Appreciate wound care reccommedations.    6. Hyponatremia: Urine sodium is 21. Urine creatinine is pending.      Signed, Arbutus Leas, NP  03/09/2016, 8:36 AM    I have seen and examined the patient along with Arbutus Leas, NP .  I have reviewed the chart, notes and new data.  I agree with NP's note.  Key new complaints: feels worse while we have held diuretics and has gained 8 lb Key examination changes: not overtly edematous Key new findings / data: UNa=21, FENa is 0.36%, consistent with avid sodium retention due to CHF/poor cardiac output, not consistent with SIADH. It appears that she has very severe CHF despite preserved LVEF. Consider cardiac infiltrative disorder, amyloidosis? Analysis compounded by the gastric outlet obstruction, which limits the patient's ability to respond to thirst/self-regulate water intake.  PLAN: Restart diuretics. Shoot for weight 131 lb or so as she weighed in early November. May need to allow creatinine rise to 1.8-1.9 level. Discuss palliative care.  Sanda Klein, MD, [place] [phone] 03/09/2016, 2:15 PM

## 2016-03-09 NOTE — Progress Notes (Signed)
ANTICOAGULATION CONSULT NOTE - Follow Up Consult  Pharmacy Consult for Coumadin Indication: Afib/Aflutter  Patient Measurements: Height: 5\' 6"  (167.6 cm) Weight: 142 lb 3.2 oz (64.5 kg) IBW/kg (Calculated) : 59.3   Vital Signs: Temp: 98.3 F (36.8 C) (12/07 0444) Temp Source: Oral (12/07 0444) BP: 122/58 (12/07 0444) Pulse Rate: 76 (12/07 0444)   Assessment: 89yof continues on Coumadin 3.75mg  daily for hx afib and recent DVT. INR 1.71 on admit. Has needed several higher doses than PTA Coumadin while inpatient. INR back up to 2.25 this am. Last Hgb low but stable at 8.1, plts wnl.  Goal of Therapy:  INR 2-3 Monitor platelets by anticoagulation protocol: Yes   Plan:  Give Coumadin 5mg  PO x 1 tonight Monitor daily INR, CBC, s/s of bleed  [name], PharmD, [facility] Clinical Pharmacist Pager [phone] 03/09/2016 8:23 AM

## 2016-03-09 NOTE — Progress Notes (Signed)
Unable to start iv.  Pt with poor veins.  Karie Kirks, RN

## 2016-03-10 ENCOUNTER — Inpatient Hospital Stay (HOSPITAL_COMMUNITY): Payer: Medicare Other

## 2016-03-10 ENCOUNTER — Encounter (HOSPITAL_COMMUNITY): Payer: Self-pay | Admitting: Cardiology

## 2016-03-10 DIAGNOSIS — Z931 Gastrostomy status: Secondary | ICD-10-CM

## 2016-03-10 LAB — GLUCOSE, CAPILLARY
Glucose-Capillary: 281 mg/dL — ABNORMAL HIGH (ref 65–99)
Glucose-Capillary: 182 mg/dL — ABNORMAL HIGH (ref 65–99)
Glucose-Capillary: 182 mg/dL — ABNORMAL HIGH (ref 65–99)
Glucose-Capillary: 147 mg/dL — ABNORMAL HIGH (ref 65–99)

## 2016-03-10 LAB — CBC
HCT: 22.8 % — ABNORMAL LOW (ref 36.0–46.0)
Hemoglobin: 7.8 g/dL — ABNORMAL LOW (ref 12.0–15.0)
MCH: 28.8 pg (ref 26.0–34.0)
MCHC: 34.2 g/dL (ref 30.0–36.0)
MCV: 84.1 fL (ref 78.0–100.0)
Platelets: 405 10*3/uL — ABNORMAL HIGH (ref 150–400)
RBC: 2.71 MIL/uL — ABNORMAL LOW (ref 3.87–5.11)
RDW: 18 % — ABNORMAL HIGH (ref 11.5–15.5)
WBC: 9 10*3/uL (ref 4.0–10.5)

## 2016-03-10 LAB — PROTIME-INR
INR: 2.2
Prothrombin Time: 24.8 seconds — ABNORMAL HIGH (ref 11.4–15.2)

## 2016-03-10 MED ORDER — GLUCERNA 1.2 CAL PO LIQD
1000.0000 mL | ORAL | Status: DC
  Administered 2016-03-10 – 2016-03-12 (×3): 1000 mL
  Filled 2016-03-10 (×9): qty 1000

## 2016-03-10 MED ORDER — WARFARIN SODIUM 5 MG PO TABS
5.0000 mg | ORAL_TABLET | Freq: Once | ORAL | Status: DC
  Filled 2016-03-10 (×2): qty 1

## 2016-03-10 MED ORDER — INSULIN ASPART 100 UNIT/ML ~~LOC~~ SOLN
0.0000 [IU] | SUBCUTANEOUS | Status: DC
  Administered 2016-03-10: 1 [IU] via SUBCUTANEOUS
  Administered 2016-03-10 – 2016-03-11 (×2): 2 [IU] via SUBCUTANEOUS

## 2016-03-10 MED ORDER — DEXTROSE 5 % IV SOLN
1.0000 g | INTRAVENOUS | Status: DC
  Administered 2016-03-10: 1 g via INTRAVENOUS
  Filled 2016-03-10: qty 10

## 2016-03-10 MED ORDER — PIPERACILLIN-TAZOBACTAM 3.375 G IVPB
3.3750 g | Freq: Three times a day (TID) | INTRAVENOUS | Status: DC
  Administered 2016-03-10 – 2016-03-14 (×11): 3.375 g via INTRAVENOUS
  Filled 2016-03-10 (×13): qty 50

## 2016-03-10 NOTE — Progress Notes (Signed)
Pharmacy Antibiotic Note  Shannon [name] is a 82 y.o. female admitted on 03/02/2016 with aspiration pneumia.  Pharmacy has been consulted for zosyn dosing.  Patient afebrile, wbc 9. Renal function is normal but with age will watch antibiotic dosing carefully.   Plan: Zosyn 3.375g IV q8 hours   Height: 5\' 6"  (167.6 cm) Weight: 142 lb 6.4 oz (64.6 kg) (scale b) IBW/kg (Calculated) : 59.3  Temp (24hrs), Avg:98.5 F (36.9 C), Min:98.1 F (36.7 C), Max:98.9 F (37.2 C)   Recent Labs Lab 03/05/16 0427 03/06/16 0314 03/06/16 1822 03/07/16 0554 03/08/16 1027 03/09/16 1037 03/10/16 0318  WBC  --   --  10.0 10.2  --  9.8 9.0  CREATININE 1.36* 1.35*  --  1.34* 1.33* 1.26*  --     Estimated Creatinine Clearance: 27.8 mL/min (by C-G formula based on SCr of 1.26 mg/dL (H)).    Allergies  Allergen Reactions  . Penicillins Itching    Has patient had a PCN reaction causing immediate rash, facial/tongue/throat swelling, SOB or lightheadedness with hypotension: Unknown Has patient had a PCN reaction causing severe rash involving mucus membranes or skin necrosis: Unknown Has patient had a PCN reaction that required hospitalization: Unknown Has patient had a PCN reaction occurring within the last 10 years: Unknown If all of the above answers are "NO", then may proceed with Cephalosporin use.   Thank you for allowing pharmacy to be a part of this patient's care.  Erin Hearing PharmD., BCPS Clinical Pharmacist Pager [phone] 03/10/2016 6:19 PM

## 2016-03-10 NOTE — Progress Notes (Signed)
Pharmacy Antibiotic Note  Shannon [name] is a 82 y.o. female admitted on 03/02/2016 with SOB, lower extremity edema, and weight gain. MD states CBGs may be worse due to UTI. Did have a recent E. Coli UTI about a month ago and was treated with ceftin which was sensitive. The E. Coli was only resistant to ampicillin and cipro. Pharmacy has been consulted for ceftriaxone dosing. Afebrile, WBC wnl.  Plan: Start ceftriaxone 1g IV Q24 Monitor clinical picture F/U C&S?, abx deescalation / LOT  Any urinary symptoms? If not consider stopping abx  Height: 5\' 6"  (167.6 cm) Weight: 142 lb 6.4 oz (64.6 kg) (scale b) IBW/kg (Calculated) : 59.3  Temp (24hrs), Avg:98.5 F (36.9 C), Min:98.1 F (36.7 C), Max:98.9 F (37.2 C)   Recent Labs Lab 03/05/16 0427 03/06/16 0314 03/06/16 1822 03/07/16 0554 03/08/16 1027 03/09/16 1037 03/10/16 0318  WBC  --   --  10.0 10.2  --  9.8 9.0  CREATININE 1.36* 1.35*  --  1.34* 1.33* 1.26*  --     Estimated Creatinine Clearance: 27.8 mL/min (by C-G formula based on SCr of 1.26 mg/dL (H)).    Allergies  Allergen Reactions  . Penicillins Itching    Has patient had a PCN reaction causing immediate rash, facial/tongue/throat swelling, SOB or lightheadedness with hypotension: Unknown Has patient had a PCN reaction causing severe rash involving mucus membranes or skin necrosis: Unknown Has patient had a PCN reaction that required hospitalization: Unknown Has patient had a PCN reaction occurring within the last 10 years: Unknown If all of the above answers are "NO", then may proceed with Cephalosporin use.     Antimicrobials this admission: Ceftriaxone 12/8 >>   Dose adjustments this admission: n/a  Microbiology results: n/a  Thank you for allowing pharmacy to be a part of this patient's care.  Reginia Naas 03/10/2016 2:40 PM

## 2016-03-10 NOTE — Progress Notes (Signed)
ANTICOAGULATION CONSULT NOTE - Follow Up Consult  Pharmacy Consult for Coumadin Indication: Afib/Aflutter  Patient Measurements: Height: 5\' 6"  (167.6 cm) Weight: 142 lb 6.4 oz (64.6 kg) (scale b) IBW/kg (Calculated) : 59.3   Vital Signs: Temp: 98.9 F (37.2 C) (12/08 0500) Temp Source: Oral (12/08 0500) BP: 130/62 (12/08 0500) Pulse Rate: 100 (12/07 2327)   Assessment: 32 yoF continues on warfarin for hx afib and recent DVT. INR 1.71 on admit, now therapeutic at 2.20. Of note, pt has required dosing higher inpatient than PTA dosing to achieve therapeutic INR. Hgb low but relatively stable, plt wnl, no S/Sx bleeding documented.  PTA warfarin dosing = 2.5mg  daily  Goal of Therapy:  INR 2-3 Monitor platelets by anticoagulation protocol: Yes   Plan:  -Warfarin 5mg  PO x1 -Monitor daily INR, CBC, s/sx of bleed  Arrie Senate, PharmD PGY-1 Pharmacy Resident Pager: [phone] 03/10/2016

## 2016-03-10 NOTE — Evaluation (Signed)
Physical Therapy Evaluation Patient Details Name: Shannon [name] MRN: [id number] DOB: 1923-10-10 Today's Date: 03/10/2016   History of Present Illness  Shannon [name] is a 82 y.o. female with a history A-fib, hypothyroidism,bradycardia, non-obstructive coronary artery disease, DM, hypertension, cerebro-vascular disease and COPD/ashma. Pt admitted for acute on chronic CHF.  Clinical Impression  Pt admitted with above diagnosis. Pt currently with functional limitations due to the deficits listed below (see PT Problem List). On eval, pt required min assist bed mobility, min assist transfers and min guard assist ambulation with RW 15 feet x 2. Pt on 3 L O2 throughout assessement. 2/4 DOE noted during gait. HR monitored and ranged from 86-121. O2 sats remained in the 90s. Pt will benefit from skilled PT to increase their independence and safety with mobility to allow discharge to the venue listed below.       Follow Up Recommendations Home health PT;Supervision/Assistance - 24 hour    Equipment Recommendations  None recommended by PT    Recommendations for Other Services       Precautions / Restrictions Precautions Precautions: Fall;Other (comment) Precaution Comments: watch O2 and HR      Mobility  Bed Mobility Overal bed mobility: Needs Assistance Bed Mobility: Supine to Sit     Supine to sit: Min assist;HOB elevated     General bed mobility comments: + rail, increased time to complete  Transfers Overall transfer level: Needs assistance Equipment used: Rolling walker (2 wheeled) Transfers: Sit to/from [employer] Sit to Stand: Min assist Stand pivot transfers: Min assist       General transfer comment: assist to power up. Increased time to complete.  Ambulation/Gait Ambulation/Gait assistance: Min guard Ambulation Distance (Feet): 15 Feet (x 2) Assistive device: Rolling walker (2 wheeled) Gait Pattern/deviations: Step-through pattern;Decreased stride  length;Trunk flexed Gait velocity: decreased Gait velocity interpretation: Below normal speed for age/gender General Gait Details: 2/4 DOE noted with minimal activity.   Stairs            Wheelchair Mobility    Modified Rankin (Stroke Patients Only)       Balance                                             Pertinent Vitals/Pain Pain Assessment: No/denies pain    Home Living Family/patient expects to be discharged to:: Private residence Living Arrangements: Children Available Help at Discharge: Family;Available 24 hours/day Type of Home: House Home Access: Ramped entrance     Home Layout: One level Home Equipment: Walker - 4 wheels;Cane - single point;Shower seat;Hand held Careers information officer - 2 wheels Additional Comments: Lives w/ daughter, son in Social worker, and grandson    Prior Function Level of Independence: Needs assistance   Gait / Transfers Assistance Needed: Uses rollator   ADL's / Homemaking Assistance Needed: family assists with bathing and other ADLs PRN        Hand Dominance   Dominant Hand: Right    Extremity/Trunk Assessment                         Communication   Communication: No difficulties  Cognition Arousal/Alertness: Awake/alert Behavior During Therapy: WFL for tasks assessed/performed Overall Cognitive Status: Within Functional Limits for tasks assessed  General Comments      Exercises General Exercises - Lower Extremity Ankle Circles/Pumps: AROM;Both;20 reps;Seated   Assessment/Plan    PT Assessment Patient needs continued PT services  PT Problem List Decreased strength;Decreased mobility;Decreased activity tolerance;Decreased balance;Cardiopulmonary status limiting activity          PT Treatment Interventions Gait training;Functional mobility training;Balance training;Therapeutic exercise;Therapeutic activities;Patient/family education    PT Goals (Current goals can  be found in the Care Plan section)  Acute Rehab PT Goals Patient Stated Goal: home PT Goal Formulation: With patient/family Time For Goal Achievement: 03/24/16 Potential to Achieve Goals: Good    Frequency Min 3X/week   Barriers to discharge        Co-evaluation               End of Session Equipment Utilized During Treatment: Gait belt;Oxygen Activity Tolerance: Treatment limited secondary to medical complications (Comment) (cardiopulmonary status) Patient left: in chair;with call bell/phone within reach;with family/visitor present Nurse Communication: Mobility status         Time: 4098-1191 PT Time Calculation (min) (ACUTE ONLY): 24 min   Charges:   PT Evaluation $PT Eval Moderate Complexity: 1 Procedure PT Treatments $Therapeutic Activity: 8-22 mins   PT G Codes:        Ilda Foil 03/10/2016, 10:59 AM

## 2016-03-10 NOTE — Progress Notes (Signed)
Patient is active with [facility] for [facility] prior to admission; CM talked with daughter [name] about DCP; she does not want to have [facility] for [facility] because she did not like the care that they provided for the patient; Patient is High Risk for Readmission, [facility] was requested; Butch Penny with [facility] called for arrangements. Butch Penny is checking to see if they can accept the case; Aneta Mins [phone]

## 2016-03-10 NOTE — Progress Notes (Signed)
Patient Name: Shannon [name] Date of Encounter: 03/10/2016  Primary Cardiologist: Dr. Rhona Raider Problem List     Principal Problem:   Acute on chronic diastolic CHF (congestive heart failure) (HCC) Active Problems:   Chronic kidney insufficiency, stage 3 (moderate)   Diabetes (HCC)   Gastric outlet obstruction   Dysphagia   Atrial flutter (HCC)   Warfarin anticoagulation   PEG (percutaneous endoscopic gastrostomy) status (HCC)     Subjective   Cough that has started during hospital stay and some chest pressure.  Inpatient Medications    Scheduled Meds: . amiodarone  200 mg Oral Daily  . atorvastatin  10 mg Oral q1800  . benzonatate  200 mg Oral TID  . cycloSPORINE  1 drop Both Eyes BID  . diltiazem  120 mg Oral Daily  . erythromycin   Both Eyes Q6H  . feeding supplement (OSMOLITE 1.5 CAL)  1,000 mL Per Tube Q24H  . feeding supplement (PRO-STAT SUGAR FREE 64)  30 mL Oral BID BM  . fluticasone  2 spray Each Nare Daily  . free water  200 mL Per Tube Q8H  . furosemide  40 mg Intravenous Q12H  . gabapentin  300 mg Oral TID  . hydrALAZINE  50 mg Oral TID  . insulin aspart  0-9 Units Subcutaneous Q4H  . isosorbide mononitrate  30 mg Oral Daily  . latanoprost  1 drop Both Eyes QHS  . levothyroxine  137 mcg Oral QAC breakfast  . loratadine  10 mg Oral Daily  . mometasone-formoterol  2 puff Inhalation BID  . pantoprazole  40 mg Oral BID  . polyvinyl alcohol  1 drop Both Eyes QID  . sertraline  100 mg Oral Daily  . sodium chloride flush  3 mL Intravenous Q12H  . warfarin  5 mg Oral ONCE-1800  . Warfarin - Pharmacist Dosing Inpatient   Does not apply q1800   Continuous Infusions:  PRN Meds: sodium chloride, acetaminophen, albuterol, guaiFENesin-dextromethorphan, HYDROcodone-acetaminophen, ipratropium-albuterol, ondansetron (ZOFRAN) IV, simethicone, sodium chloride flush, traMADol   Vital Signs    Vitals:   03/10/16 0500 03/10/16 0855 03/10/16 1017 03/10/16 1118   BP: 130/62  139/70 136/70  Pulse:    85  Resp: 20   18  Temp: 98.9 F (37.2 C)   98.6 F (37 C)  TempSrc: Oral   Oral  SpO2: 100% 100%  95%  Weight: 142 lb 6.4 oz (64.6 kg)     Height:        Intake/Output Summary (Last 24 hours) at 03/10/16 1214 Last data filed at 03/10/16 0802  Gross per 24 hour  Intake              203 ml  Output             1750 ml  Net            -1547 ml   Filed Weights   03/08/16 0455 03/09/16 0444 03/10/16 0500  Weight: 144 lb 8 oz (65.5 kg) 142 lb 3.2 oz (64.5 kg) 142 lb 6.4 oz (64.6 kg)    Physical Exam   GEN: up in chair, in no acute distress. Takes deep breaths with talking HEENT: normocephalic, sclera clear, mucus membranes moist.  Neck: Supple, no JVD or masses. Cardiac: irreg irreg, no murmurs, rubs, or gallops. No clubbing, cyanosis, edema.  Radials/DP/PT 2+ and equal bilaterally.  Respiratory:  Respirations regular and somewhat labored,diminished throughout with auscultation bilaterally without rales, rhonchi or wheezes. GI: Abd -  Soft, nontender, nondistended, BS + x 4. MS: no deformity or atrophy. Skin: warm and dry, brisk capillary refill, no obvious rash Neuro:  Alert and oriented X 3 MAE, follows commands Psych: answers questions appropriately,Normal and pleasant affect.   Labs    CBC  Recent Labs  03/09/16 1037 03/10/16 0318  WBC 9.8 9.0  HGB 8.2* 7.8*  HCT 24.0* 22.8*  MCV 84.2 84.1  PLT 407* 025*   Basic Metabolic Panel  Recent Labs  03/08/16 1027 03/09/16 1037  NA 127* 125*  K 3.8 4.2  CL 87* 87*  CO2 28 28  GLUCOSE 244* 223*  BUN 47* 46*  CREATININE 1.33* 1.26*  CALCIUM 8.7* 8.4*     Telemetry    A flutter rate controlled - Personally Reviewed  ECG    No new since the 4th. - Personally Reviewed  Radiology    No results found.  Cardiac Studies   none  Patient Profile     Shannon [name] is a 82 year old female with a past medical history of persistent Afib/flutter on Amiodarone, CKD stage III,  DM, HTN, chronic diastolic CHF, CVA, gastric outlet obstruction s/p J tube. Multiple recent hospitalizations most recent in Oct. 2017 for rapid aflutter and acute on chronic diastolic CHF.   Presented to the office on 03/02/16 with 8 pound weight gain in 2 days, BLE edema and orthopnea. Admitted for IV diuresis    Assessment & Plan    1.Acute on chronic diastolic CHF:  Lasix resumed yesterday (with holding WT increased and I&O +) --negative 1197 today and positive 572 since admit --weight 142 today decreasing now.     2. Acute respiratory failure: Stable. She does have underlying pulmonary issues with asthmas and COPD. O2 saturations are 100%. Baseline she has always taken deep gasps of breath when talking.  But today with cough and a chest pressure.  Will check CXR  No fevers.  WBC is normal. With gastric outlet obstruction she has aspiration   3. Left lower extremity DVT last admission: Stable.She was started on coumadin. No evidence of right heart strain on Echo last admit.   4. Chronic Afib: Rate controlled. On Amiodarone daily. Last SR by EKG 01/25/16  -Dr. Meda Coffee thought [place] may be helpful on admit -anticoagulation with INR 2.20  5. H/o GOO s/p ge-tube:  Appreciate wound care reccommedations. receives tube feeding 12 hours at night will need to monitor glucose at night. Changed   6. Hyponatremia: Urine sodium is 21. Urine creatinine 58.80  Yesterday Na 125   7.  Anemic due to chronic disease now with H/H 7.8/22.8  Average 8.1 to 8.6   8.  DM-2 will check hgb A1c glusoce elevated adjusting insulin per diabetic coordinator recommendations.   Has not been on insulin glucose has been elevated > 200 with SSI ? IM consult for diabetes. She needs home plan.  9. Hypothyroidism with TSH of 55 in August last check 01/25/16 was 8.794 will recheck in AM  Daughter could not wait for MD, she would like update.     Signed, Cecilie Kicks, NP  03/10/2016, 12:14 PM  [place] Pager [phone]  After 5 or weekends [phone]  I have seen and examined the patient along with Cecilie Kicks, NP.  I have reviewed the chart, notes and new data.  I agree with NP's note.  Key new complaints: continues to cough, now has chest discomfort Key examination changes: reduced breath sounds L posterior lung,  irregular rhythm Key new findings / data: New infiltrate in L lung base, persistent hyponatremia, albeit less severe. FENa confirms avid sodium retention c/w CHF, not SIADH.   PLAN: Start antibiotics for presumed aspiration pneumonia. Continue diuresis. Currently NPO and planning repeat swallow evaluation. Discussed current clinical status with her daughter Shannon [name] and expressed my pessimism about her recovery. The focus should be on symptom relief.   Sanda Klein, MD, [place] [phone] 03/10/2016, 5:48 PM

## 2016-03-10 NOTE — Consult Note (Signed)
Medical Consultation   Shannon [name]  WUJ:[id number]  DOB: [birth year]  DOA: 03/02/2016  PCP: Kirt Boys, DO   Requesting physician: Nada Boozer - NP  Reason for consultation: Hyperglycemia  History of Present Illness: Shannon [name] is an 82 y.o. female with past medical history significant for diabetes, stroke, SIADH, hypothyroid, hypertension, DVT, COPD, CK D, CHF, CAD and asthma is currently admitted for acute exacerbation of congestive heart failure. Patient has had elevated blood sugars throughout her hospital stay. She has not received any insulin. Patient states that she used to be on insulin for diabetes but is now diet controlled.    Review of Systems:  ROS As per HPI otherwise 10 point review of systems negative.    Past Medical History: Past Medical History:  Diagnosis Date  . Anemia    previous blood transfusions  . Arthritis    "all over"  . Asthma   . Bradycardia    requiring previous d/c of BB and reduction of amiodarone  . CAD (coronary artery disease)    nonobstructive per notes  . Chronic diastolic CHF (congestive heart failure) (HCC)   . CKD (chronic kidney disease), stage III   . Complication of blood transfusion    "got the wrong blood type at [facility] in ~ 2015; no adverse reaction that we are aware of"/daughter, Shannon [name] (01/27/2016)  . COPD (chronic obstructive pulmonary disease) (HCC)   . Depression    "light case"  . DVT (deep venous thrombosis) (HCC) 01/2016   a. LLE DVT 01/2016 - switched from Eliquis to Coumadin.  . Gastric stenosis    a. s/p stomach tube  . GERD (gastroesophageal reflux disease)   . History of blood transfusion    "several" (01/27/2016)  . History of stomach ulcers   . Hyperlipidemia   . Hypertension   . Hypothyroidism   . Paraesophageal hernia   . Perforated gastric ulcer (HCC)   . Seasonal allergies   . SIADH (syndrome of inappropriate ADH production) (HCC)    Shannon [name] 01/10/2015  . Small bowel  obstruction    "I don't know how many" (01/11/2015)  . Stroke (HCC)    "light one"  . Type II diabetes mellitus (HCC)    "related to prednisone use  for > 20 yr; once predinose stopped; no more DM RX" (01/27/2016)  . Ventral hernia with bowel obstruction     Past Surgical History: Past Surgical History:  Procedure Laterality Date  . CATARACT EXTRACTION W/ INTRAOCULAR LENS  IMPLANT, BILATERAL    . CHOLECYSTECTOMY OPEN    . COLECTOMY    . ESOPHAGOGASTRODUODENOSCOPY N/A 01/19/2014   Procedure: ESOPHAGOGASTRODUODENOSCOPY (EGD);  Surgeon: Hilarie Fredrickson, MD;  Location: [facility];  Service: Endoscopy;  Laterality: N/A;  . ESOPHAGOGASTRODUODENOSCOPY N/A 01/20/2014   Procedure: ESOPHAGOGASTRODUODENOSCOPY (EGD);  Surgeon: Hilarie Fredrickson, MD;  Location: [facility];  Service: Endoscopy;  Laterality: N/A;  . ESOPHAGOGASTRODUODENOSCOPY N/A 03/19/2014   Procedure: ESOPHAGOGASTRODUODENOSCOPY (EGD);  Surgeon: Rachael Fee, MD;  Location: [facility];  Service: Endoscopy;  Laterality: N/A;  . ESOPHAGOGASTRODUODENOSCOPY N/A 07/08/2015   Procedure: ESOPHAGOGASTRODUODENOSCOPY (EGD);  Surgeon: Sherrilyn Rist, MD;  Location: [facility] ENDOSCOPY;  Service: Endoscopy;  Laterality: N/A;  . ESOPHAGOGASTRODUODENOSCOPY (EGD) WITH PROPOFOL N/A 09/15/2015   Procedure: ESOPHAGOGASTRODUODENOSCOPY (EGD) WITH PROPOFOL;  Surgeon: Ruffin Frederick, MD;  [facility];  Service: Gastroenterology;  Laterality: N/A;  . GASTROJEJUNOSTOMY  hx/notes 01/10/2015  . GASTROJEJUNOSTOMY N/A 09/23/2015   Procedure: OPEN GASTROJEJUNOSTOMY TUBE PLACEMENT;  Surgeon: De Blanch Kinsinger, MD;  Location: [facility] ORS;  Service: General;  Laterality: N/A;  . GLAUCOMA SURGERY Bilateral   . HERNIA REPAIR  2015  . IR GENERIC HISTORICAL  01/07/2016   IR GJ TUBE CHANGE 01/07/2016 Malachy Moan, MD [facility]  . IR GENERIC HISTORICAL  01/27/2016   IR MECH REMOV OBSTRUC MAT ANY COLON TUBE W/FLUORO 01/27/2016 Richarda Overlie, MD [facility]  . IR GENERIC HISTORICAL  02/07/2016   IR PATIENT EVAL TECH 0-60 MINS Darrell K Allred, PA-C [facility]  . IR GENERIC HISTORICAL  02/08/2016   IR GJ TUBE CHANGE 02/08/2016 Berdine Dance, MD [facility]  . LAPAROTOMY N/A 01/20/2015   Procedure: EXPLORATORY LAPAROTOMY;  Surgeon: Abigail Miyamoto, MD;  Location: [facility] OR;  Service: General;  Laterality: N/A;  . LYSIS OF ADHESION N/A 01/20/2015   Procedure: LYSIS OF ADHESIONS < 1 HOUR;  Surgeon: Abigail Miyamoto, MD;  Location: [facility] OR;  Service: General;  Laterality: N/A;  . TONSILLECTOMY    . TUBAL LIGATION    . VENTRAL HERNIA REPAIR  2015   incarcerated ventral hernia ([facility] 09/2013)/notes 01/10/2015     Allergies:   Allergies  Allergen Reactions  . Penicillins Itching    Has patient had a PCN reaction causing immediate rash, facial/tongue/throat swelling, SOB or lightheadedness with hypotension: Unknown Has patient had a PCN reaction causing severe rash involving mucus membranes or skin necrosis: Unknown Has patient had a PCN reaction that required hospitalization: Unknown Has patient had a PCN reaction occurring within the last 10 years: Unknown If all of the above answers are "NO", then may proceed with Cephalosporin use.      Social History:  reports that she has never smoked. She has quit using smokeless tobacco. Her smokeless tobacco use included Snuff. She reports that she does not drink alcohol or use drugs.   Family History: Family History  Problem Relation Age of Onset  . Stroke Mother   . Hypertension Mother   . Diabetes Brother   . Heart attack Neg Hx     Physical Exam: Vitals:   03/10/16 0500 03/10/16 0855 03/10/16 1017 03/10/16 1118  BP: 130/62  139/70 136/70  Pulse:    85  Resp: 20   18  Temp: 98.9 F (37.2 C)   98.6 F (37 C)  TempSrc: Oral   Oral  SpO2: 100% 100%  95%  Weight: 64.6 kg (142 lb 6.4 oz)     Height:        Constitutional: Appearance,  Alert and awake, oriented x3, And respiratory  distress. Eyes: PERLA, EOMI, irises appear normal, anicteric sclera,  ENMT: external ears and nose appear normal, normal hearing or hard of hearing            Lips appears normal, oropharynx mucosa, tongue, posterior pharynx appear normal  Neck: neck appears normal, no masses, normal ROM, no thyromegaly, no JVD  CVS: S1-S2 clear, no murmur rubs or gallops, no LE edema, normal pedal pulses  Respiratory:  Increased work of breathing, speaking in short sentences, wheezing, rales Abdomen: soft nontender, nondistended, normal bowel sounds, no hepatosplenomegaly, no hernias  Musculoskeletal: : no cyanosis, clubbing or edema noted bilaterally                       Joint/bones/muscle exam, strength, contractures or atrophy Neuro: Cranial nerves II-XII intact, strength, sensation, reflexes Psych: judgement  and insight appear normal, stable mood and affect, mental status Skin: no rashes or lesions or ulcers, no induration or nodules    Data reviewed:  I have personally reviewed following labs and imaging studies Labs:  CBC:  Recent Labs Lab 03/06/16 1822 03/07/16 0554 03/09/16 1037 03/10/16 0318  WBC 10.0 10.2 9.8 9.0  NEUTROABS 7.0  --   --   --   HGB 8.6* 8.1* 8.2* 7.8*  HCT 25.2* 24.3* 24.0* 22.8*  MCV 84.6 85.0 84.2 84.1  PLT 372 386 407* 405*    Basic Metabolic Panel:  Recent Labs Lab 03/05/16 0427 03/06/16 0314 03/07/16 0554 03/08/16 1027 03/09/16 1037  NA 128* 128* 125* 127* 125*  K 4.2 3.7 3.6 3.8 4.2  CL 90* 90* 86* 87* 87*  CO2 26 27 29 28 28   GLUCOSE 260* 256* 252* 244* 223*  BUN 46* 48* 44* 47* 46*  CREATININE 1.36* 1.35* 1.34* 1.33* 1.26*  CALCIUM 9.1 8.7* 8.6* 8.7* 8.4*   GFR Estimated Creatinine Clearance: 27.8 mL/min (by C-G formula based on SCr of 1.26 mg/dL (H)). Liver Function Tests: No results for input(s): AST, ALT, ALKPHOS, BILITOT, PROT, ALBUMIN in the last 168 hours. No results for input(s): LIPASE, AMYLASE in the last 168 hours. No results for  input(s): AMMONIA in the last 168 hours. Coagulation profile  Recent Labs Lab 03/06/16 0314 03/07/16 0554 03/08/16 0425 03/09/16 0248 03/10/16 0318  INR 2.24 2.34 1.84 2.25 2.20    Cardiac Enzymes: No results for input(s): CKTOTAL, CKMB, CKMBINDEX, TROPONINI in the last 168 hours. BNP: Invalid input(s): POCBNP CBG:  Recent Labs Lab 03/09/16 1255 03/09/16 1700 03/09/16 2128 03/10/16 0609 03/10/16 1115  GLUCAP 206* 185* 119* 281* 182*   D-Dimer No results for input(s): DDIMER in the last 72 hours. Hgb A1c No results for input(s): HGBA1C in the last 72 hours. Lipid Profile No results for input(s): CHOL, HDL, LDLCALC, TRIG, CHOLHDL, LDLDIRECT in the last 72 hours. Thyroid function studies No results for input(s): TSH, T4TOTAL, T3FREE, THYROIDAB in the last 72 hours.  Invalid input(s): FREET3 Anemia work up No results for input(s): VITAMINB12, FOLATE, FERRITIN, TIBC, IRON, RETICCTPCT in the last 72 hours. Urinalysis    Component Value Date/Time   COLORURINE YELLOW 02/07/2016 2112   APPEARANCEUR CLOUDY (A) 02/07/2016 2112   LABSPEC 1.013 02/07/2016 2112   PHURINE 8.5 (H) 02/07/2016 2112   GLUCOSEU NEGATIVE 02/07/2016 2112   HGBUR NEGATIVE 02/07/2016 2112   BILIRUBINUR NEGATIVE 02/07/2016 2112   KETONESUR NEGATIVE 02/07/2016 2112   PROTEINUR 30 (A) 02/07/2016 2112   UROBILINOGEN 0.2 01/27/2015 2308   NITRITE POSITIVE (A) 02/07/2016 2112   LEUKOCYTESUR LARGE (A) 02/07/2016 2112     Microbiology Recent Results (from the past 240 hour(s))  MRSA PCR Screening     Status: Abnormal   Collection Time: 03/02/16  6:37 PM  Result Value Ref Range Status   MRSA by PCR POSITIVE (A) NEGATIVE Final    Comment:        The GeneXpert MRSA Assay (FDA approved for NASAL specimens only), is one component of a comprehensive MRSA colonization surveillance program. It is not intended to diagnose MRSA infection nor to guide or monitor treatment for MRSA infections. CRITICAL  RESULT CALLED TO, READ BACK BY AND VERIFIED WITH: RDeanne Coffer, RN AT 2235 ON 03/02/16 BY C. JESSUP, MLT.        Inpatient Medications:   Scheduled Meds: . amiodarone  200 mg Oral Daily  . atorvastatin  10 mg Oral  q1800  . benzonatate  200 mg Oral TID  . cycloSPORINE  1 drop Both Eyes BID  . diltiazem  120 mg Oral Daily  . erythromycin   Both Eyes Q6H  . feeding supplement (OSMOLITE 1.5 CAL)  1,000 mL Per Tube Q24H  . feeding supplement (PRO-STAT SUGAR FREE 64)  30 mL Oral BID BM  . fluticasone  2 spray Each Nare Daily  . free water  200 mL Per Tube Q8H  . furosemide  40 mg Intravenous Q12H  . gabapentin  300 mg Oral TID  . hydrALAZINE  50 mg Oral TID  . insulin aspart  0-9 Units Subcutaneous Q4H  . isosorbide mononitrate  30 mg Oral Daily  . latanoprost  1 drop Both Eyes QHS  . levothyroxine  137 mcg Oral QAC breakfast  . loratadine  10 mg Oral Daily  . mometasone-formoterol  2 puff Inhalation BID  . pantoprazole  40 mg Oral BID  . polyvinyl alcohol  1 drop Both Eyes QID  . sertraline  100 mg Oral Daily  . sodium chloride flush  3 mL Intravenous Q12H  . warfarin  5 mg Oral ONCE-1800  . Warfarin - Pharmacist Dosing Inpatient   Does not apply q1800   Continuous Infusions:   Radiological Exams on Admission: No results found.  Impression/Recommendations Principal Problem:   Acute on chronic diastolic CHF (congestive heart failure) (HCC) Active Problems:   Chronic kidney insufficiency, stage 3 (moderate)   Diabetes (HCC)   Gastric outlet obstruction   Dysphagia   Atrial flutter (HCC)   Warfarin anticoagulation   PEG (percutaneous endoscopic gastrostomy) status (HCC)   Hyperglycemia Patient does have a history of diet-controlled diabetes. Blood sugar control likely worsened due to urinary tract infection. Patient was on Ceftin as an outpatient but recent UA and culture shows Escherichia coli still present. Culture shows that there is sensitivity Rocephin. This is an  acceptable change in antibiotic therapy given that Ceftin has very limited gram-negative coverage. Blood sugars could also be affected by the wound on patient's buttocks. Nursing to look at what they have available to see if wound care consult is necessary. Aspiration concerns from nursing. CXR pending. Ordered SLP eval, has dysphagia hx, will need recs for continued feeding. TFs switched from Osmolite to Glucerna. A1c pending.  Other issues per primary team.  Thank you for this consultation.  Our [facility] hospitalist team will follow the patient with you.   Time Spent: 19  Haydee Salter M.D. [facility] 03/10/2016, 2:14 PM

## 2016-03-11 DIAGNOSIS — L8990 Pressure ulcer of unspecified site, unspecified stage: Secondary | ICD-10-CM | POA: Insufficient documentation

## 2016-03-11 LAB — BASIC METABOLIC PANEL
Anion gap: 14 (ref 5–15)
BUN: 50 mg/dL — ABNORMAL HIGH (ref 6–20)
CO2: 28 mmol/L (ref 22–32)
Calcium: 9.1 mg/dL (ref 8.9–10.3)
Chloride: 88 mmol/L — ABNORMAL LOW (ref 101–111)
Creatinine, Ser: 1.34 mg/dL — ABNORMAL HIGH (ref 0.44–1.00)
GFR calc Af Amer: 39 mL/min — ABNORMAL LOW (ref >60)
GFR calc non Af Amer: 34 mL/min — ABNORMAL LOW (ref >60)
Glucose, Bld: 183 mg/dL — ABNORMAL HIGH (ref 65–99)
Potassium: 4.1 mmol/L (ref 3.5–5.1)
Sodium: 130 mmol/L — ABNORMAL LOW (ref 135–145)

## 2016-03-11 LAB — GLUCOSE, CAPILLARY
Glucose-Capillary: 174 mg/dL — ABNORMAL HIGH (ref 65–99)
Glucose-Capillary: 225 mg/dL — ABNORMAL HIGH (ref 65–99)
Glucose-Capillary: 139 mg/dL — ABNORMAL HIGH (ref 65–99)
Glucose-Capillary: 140 mg/dL — ABNORMAL HIGH (ref 65–99)
Glucose-Capillary: 158 mg/dL — ABNORMAL HIGH (ref 65–99)

## 2016-03-11 LAB — HEMOGLOBIN A1C
Hgb A1c MFr Bld: 7.3 % — ABNORMAL HIGH (ref 4.8–5.6)
Mean Plasma Glucose: 163 mg/dL

## 2016-03-11 LAB — PROTIME-INR
INR: 2.3
Prothrombin Time: 25.7 seconds — ABNORMAL HIGH (ref 11.4–15.2)

## 2016-03-11 LAB — TSH: TSH: 5.642 u[IU]/mL — ABNORMAL HIGH (ref 0.350–4.500)

## 2016-03-11 MED ORDER — WARFARIN SODIUM 5 MG PO TABS
5.0000 mg | ORAL_TABLET | Freq: Once | ORAL | Status: AC
  Administered 2016-03-11: 5 mg via ORAL
  Filled 2016-03-11: qty 1

## 2016-03-11 MED ORDER — MORPHINE SULFATE (CONCENTRATE) 10 MG/0.5ML PO SOLN
5.0000 mg | ORAL | Status: DC | PRN
  Administered 2016-03-11: 5 mg via ORAL
  Filled 2016-03-11: qty 0.5

## 2016-03-11 MED ORDER — INSULIN ASPART 100 UNIT/ML ~~LOC~~ SOLN
0.0000 [IU] | SUBCUTANEOUS | Status: DC
  Administered 2016-03-11: 1 [IU] via SUBCUTANEOUS
  Administered 2016-03-11: 2 [IU] via SUBCUTANEOUS
  Administered 2016-03-11: 3 [IU] via SUBCUTANEOUS
  Administered 2016-03-12: 2 [IU] via SUBCUTANEOUS
  Administered 2016-03-12: 1 [IU] via SUBCUTANEOUS
  Administered 2016-03-12 – 2016-03-13 (×6): 2 [IU] via SUBCUTANEOUS
  Administered 2016-03-13: 1 [IU] via SUBCUTANEOUS
  Administered 2016-03-13 (×2): 2 [IU] via SUBCUTANEOUS
  Administered 2016-03-13 – 2016-03-14 (×2): 1 [IU] via SUBCUTANEOUS
  Administered 2016-03-14 (×3): 2 [IU] via SUBCUTANEOUS

## 2016-03-11 NOTE — Progress Notes (Signed)
Patient up to [facility] multiple times during 7a-7 shift, does get very short of breath and is short of breath at rest as well.  Did try Morphine po but patient did not feel this was any assistance with her shortness of breath.  Daughter called and is concerned that patient is not on thickened liquid even though this was not speech therapies recommendation.  Advised daughter to call and speak with physician or speech therapy regarding her concerns.

## 2016-03-11 NOTE — Evaluation (Addendum)
Clinical/Bedside Swallow Evaluation Patient Details  Name: Shannon [name] MRN: [id number] Date of Birth: [birth year]  Today's Date: 03/11/2016 Time: SLP Start Time (ACUTE ONLY): 0915 SLP Stop Time (ACUTE ONLY): 0935 SLP Time Calculation (min) (ACUTE ONLY): 20 min  Past Medical History:  Past Medical History:  Diagnosis Date  . Anemia    previous blood transfusions  . Arthritis    "all over"  . Asthma   . Bradycardia    requiring previous d/c of BB and reduction of amiodarone  . CAD (coronary artery disease)    nonobstructive per notes  . Chronic diastolic CHF (congestive heart failure) (HCC)   . CKD (chronic kidney disease), stage III   . Complication of blood transfusion    "got the wrong blood type at [facility] in ~ 2015; no adverse reaction that we are aware of"/daughter, [name] (01/27/2016)  . COPD (chronic obstructive pulmonary disease) (HCC)   . Depression    "light case"  . DVT (deep venous thrombosis) (HCC) 01/2016   a. LLE DVT 01/2016 - switched from Eliquis to Coumadin.  . Gastric stenosis    a. s/p stomach tube  . GERD (gastroesophageal reflux disease)   . History of blood transfusion    "several" (01/27/2016)  . History of stomach ulcers   . Hyperlipidemia   . Hypertension   . Hypothyroidism   . Paraesophageal hernia   . Perforated gastric ulcer (HCC)   . Seasonal allergies   . SIADH (syndrome of inappropriate ADH production) (HCC)    [name] 01/10/2015  . Small bowel obstruction    "I don't know how many" (01/11/2015)  . Stroke (HCC)    "light one"  . Type II diabetes mellitus (HCC)    "related to prednisone use  for > 20 yr; once predinose stopped; no more DM RX" (01/27/2016)  . Ventral hernia with bowel obstruction    Past Surgical History:  Past Surgical History:  Procedure Laterality Date  . CATARACT EXTRACTION W/ INTRAOCULAR LENS  IMPLANT, BILATERAL    . CHOLECYSTECTOMY OPEN    . COLECTOMY    . ESOPHAGOGASTRODUODENOSCOPY N/A 01/19/2014   Procedure:  ESOPHAGOGASTRODUODENOSCOPY (EGD);  Surgeon: Hilarie Fredrickson, MD;  Location: [facility] ENDOSCOPY;  Service: Endoscopy;  Laterality: N/A;  . ESOPHAGOGASTRODUODENOSCOPY N/A 01/20/2014   Procedure: ESOPHAGOGASTRODUODENOSCOPY (EGD);  Surgeon: Hilarie Fredrickson, MD;  Location: [facility] ENDOSCOPY;  Service: Endoscopy;  Laterality: N/A;  . ESOPHAGOGASTRODUODENOSCOPY N/A 03/19/2014   Procedure: ESOPHAGOGASTRODUODENOSCOPY (EGD);  Surgeon: Rachael Fee, MD;  Location: [facility]OSCOPY;  Service: Endoscopy;  Laterality: N/A;  . ESOPHAGOGASTRODUODENOSCOPY N/A 07/08/2015   Procedure: ESOPHAGOGASTRODUODENOSCOPY (EGD);  Surgeon: Sherrilyn Rist, MD;  Location: [facility] ENDOSCOPY;  Service: Endoscopy;  Laterality: N/A;  . ESOPHAGOGASTRODUODENOSCOPY (EGD) WITH PROPOFOL N/A 09/15/2015   Procedure: ESOPHAGOGASTRODUODENOSCOPY (EGD) WITH PROPOFOL;  Surgeon: Ruffin Frederick, MD;  [facility];  Service: Gastroenterology;  Laterality: N/A;  . GASTROJEJUNOSTOMY     hx/notes 01/10/2015  . GASTROJEJUNOSTOMY N/A 09/23/2015   Procedure: OPEN GASTROJEJUNOSTOMY TUBE PLACEMENT;  Surgeon: De Blanch Kinsinger, MD;  Location: [facility] ORS;  Service: General;  Laterality: N/A;  . GLAUCOMA SURGERY Bilateral   . HERNIA REPAIR  2015  . IR GENERIC HISTORICAL  01/07/2016   IR GJ TUBE CHANGE 01/07/2016 Malachy Moan, MD [facility]  . IR GENERIC HISTORICAL  01/27/2016   IR MECH REMOV OBSTRUC MAT ANY COLON TUBE W/FLUORO 01/27/2016 Richarda Overlie, MD [facility]  . IR GENERIC HISTORICAL  02/07/2016   IR PATIENT EVAL TECH  0-60 MINS Darrell K Allred, PA-C [facility]  . IR GENERIC HISTORICAL  02/08/2016   IR GJ TUBE CHANGE 02/08/2016 Berdine Dance, MD [facility]  . LAPAROTOMY N/A 01/20/2015   Procedure: EXPLORATORY LAPAROTOMY;  Surgeon: Abigail Miyamoto, MD;  Location: [facility] OR;  Service: General;  Laterality: N/A;  . LYSIS OF ADHESION N/A 01/20/2015   Procedure: LYSIS OF ADHESIONS < 1 HOUR;  Surgeon: Abigail Miyamoto, MD;  Location: [facility] OR;  Service:  General;  Laterality: N/A;  . TONSILLECTOMY    . TUBAL LIGATION    . VENTRAL HERNIA REPAIR  2015   incarcerated ventral hernia ([facility] 09/2013)/notes 01/10/2015   HPI:  Pt is a 82 y.o. female with PMH of anemia, asthma, CAD, CHF, COPD, hx of DVT on coumadin, HTN, HLP, SIADH, DM2, presenting to the ED on 11/30 for shortness of breath. Pt reported coughing a lot in the past few days with greenish sputum. Pt has had multiple recent hospitalizations and has been followed by speech therapy over previous admissions. Pt also with history of remote gastrojejunostomy & suspected vagotomy, status post GJ anastomosis resection and GJ revision after perforation of anastomotic ulcer 09/2013. Pt had a MBS on 11/09/15 indicating penetration of thin liquids to level of VFs which pt was able to sense and clear most of material. Also noted prominence at CP segment. Recommendation at that time was nectar thick liquids/ full liquid diet although there were pharyngeal residuals on this consistency. Pt was receiving additional nutrition through J tube. Pt seen again 01/26/16 with recommendation of full thin liquid diet. CXR on 12/8 showed left LL PNA with associated effusion. Bedside swallow eval ordered to re-assess given extensive history.   Assessment / Plan / Recommendation Clinical Impression  Pt had an immediate throat clear x1 following sip of thin liquid; no other s/s of aspiration were noted during evaluation. Reviewed results of previous MBS in August with pt- occasional penetration of thin liquids due to premature spill and reduced hyolaryngeal excursion- pt sensed and cleared penetrated material; no penetration on nectar-thick but some increased residuals. The pt is at risk of aspiration on both consistencies for those reasons along with decreased respiratory status. Reviewed aspiration risks with pt and the possibility of repeating the MBS; the pt does not wish to have a re-evaluation at this time. Recommend initiating a  full liquid diet along with full supervision during meals to cue pt to take small bites/ sips at a time, cue pt to clear throat every few sips for possible penetrated material. Also follow reflux precautions- sit upright 60 minutes after meal; pt may benefit from smaller, more frequent meals. Pt will still be at a moderate risk of aspiration on this diet. Pt reports receiving tube feedings overnight from 8 PM to 8 AM- given hx of GERD, ? whether pt is at risk of aspirating refluxed material if receiving feeds while lying down overnight. RN and pt report no difficulties having meds whole with liquid. Will f/u x1 for continued education and diet tolerance.    Aspiration Risk  Moderate aspiration risk    Diet Recommendation Thin liquid;Other (Comment) (full liquid)   Liquid Administration via: Cup;Straw Medication Administration: Whole meds with liquid Supervision: Patient able to self feed;Full supervision/cueing for compensatory strategies Compensations: Slow rate;Small sips/bites;Clear throat intermittently Postural Changes: Seated upright at 90 degrees;Remain upright for at least 30 minutes after po intake    Other  Recommendations Oral Care Recommendations: Oral care BID Other Recommendations: Clarify dietary restrictions  Follow up Recommendations Other (comment) (TBD)      Frequency and Duration min 1 x/week  1 week       Prognosis Prognosis for Safe Diet Advancement: Guarded      Swallow Study   General HPI: Pt is a 82 y.o. female with PMH of anemia, asthma, CAD, CHF, COPD, hx of DVT on coumadin, HTN, HLP, SIADH, DM2, presenting to the ED on 11/30 for shortness of breath. Pt reported coughing a lot in the past few days with greenish sputum. Pt has had multiple recent hospitalizations and has been followed by speech therapy over previous admissions. Pt also with history of remote gastrojejunostomy & suspected vagotomy, status post GJ anastomosis resection and GJ revision after  perforation of anastomotic ulcer 09/2013. Pt had a MBS on 11/09/15 indicating penetration of thin liquids to level of VFs which pt was able to sense and clear most of material. Also noted prominence at CP segment. Recommendation at that time was nectar thick liquids/ full liquid diet although there were pharyngeal residuals on this consistency. Pt was receiving additional nutrition through J tube. Pt seen again 01/26/16 with recommendation of full thin liquid diet. CXR on 12/8 showed left LL PNA with associated effusion. Bedside swallow eval ordered to re-assess given extensive history. Type of Study: Bedside Swallow Evaluation Previous Swallow Assessment: MBS Aug 2017- rec'd full liquid diet/ nectar thick liquids Diet Prior to this Study: NPO Temperature Spikes Noted: No Respiratory Status: Nasal cannula History of Recent Intubation: No Behavior/Cognition: Alert;Cooperative;Pleasant mood Oral Cavity Assessment: Dry Oral Care Completed by SLP:  (pt recently brushed teeth) Oral Cavity - Dentition: Missing dentition Vision: Functional for self-feeding Self-Feeding Abilities: Able to feed self Patient Positioning: Upright in bed Baseline Vocal Quality: Hoarse Volitional Cough: Strong Volitional Swallow: Able to elicit    Oral/Motor/Sensory Function Overall Oral Motor/Sensory Function: Within functional limits   Ice Chips Ice chips: Not tested   Thin Liquid Thin Liquid: Impaired Presentation: Straw Pharyngeal  Phase Impairments: Throat Clearing - Immediate    Nectar Thick Nectar Thick Liquid: Not tested   Honey Thick Honey Thick Liquid: Not tested   Puree Puree: Not tested (pt reported being unable to have applesauce)   Solid   GO   Solid: Not tested        Metro Kung, MA, CCC-SLP 03/11/2016,9:53 AM [phone]

## 2016-03-11 NOTE — Progress Notes (Addendum)
Patient Name: Shannon [name] Date of Encounter: 03/11/2016  Primary Cardiologist: Dr. Rhona Raider Problem List     Principal Problem:   Acute on chronic diastolic CHF (congestive heart failure) (HCC) Active Problems:   Chronic kidney insufficiency, stage 3 (moderate)   Diabetes (HCC)   Gastric outlet obstruction   Dysphagia   Atrial flutter (HCC)   Warfarin anticoagulation   PEG (percutaneous endoscopic gastrostomy) status ([facility])     Subjective   Started on Zosyn for potential aspiration pneumonia. Thin liquids were recommended by swallow evaluation.  I appreciate Dr. Verlon Au seeing patient for diabetes.  She appears mild to moderately labored with her breathing.    Inpatient Medications    Scheduled Meds: . amiodarone  200 mg Oral Daily  . atorvastatin  10 mg Oral q1800  . benzonatate  200 mg Oral TID  . cycloSPORINE  1 drop Both Eyes BID  . diltiazem  120 mg Oral Daily  . erythromycin   Both Eyes Q6H  . feeding supplement (PRO-STAT SUGAR FREE 64)  30 mL Oral BID BM  . fluticasone  2 spray Each Nare Daily  . free water  200 mL Per Tube Q8H  . furosemide  40 mg Intravenous Q12H  . gabapentin  300 mg Oral TID  . hydrALAZINE  50 mg Oral TID  . insulin aspart  0-9 Units Subcutaneous Q4H  . isosorbide mononitrate  30 mg Oral Daily  . latanoprost  1 drop Both Eyes QHS  . levothyroxine  137 mcg Oral QAC breakfast  . loratadine  10 mg Oral Daily  . mometasone-formoterol  2 puff Inhalation BID  . pantoprazole  40 mg Oral BID  . piperacillin-tazobactam (ZOSYN)  IV  3.375 g Intravenous Q8H  . polyvinyl alcohol  1 drop Both Eyes QID  . sertraline  100 mg Oral Daily  . sodium chloride flush  3 mL Intravenous Q12H  . warfarin  5 mg Oral ONCE-1800  . Warfarin - Pharmacist Dosing Inpatient   Does not apply q1800   Continuous Infusions: . feeding supplement (GLUCERNA 1.2 CAL) 1,000 mL (03/10/16 1513)   PRN Meds: sodium chloride, acetaminophen, albuterol,  guaiFENesin-dextromethorphan, HYDROcodone-acetaminophen, ipratropium-albuterol, ondansetron (ZOFRAN) IV, simethicone, sodium chloride flush, traMADol   Vital Signs    Vitals:   03/10/16 2012 03/11/16 0353 03/11/16 1111 03/11/16 1141  BP: (!) 143/50 133/70 (!) 143/68 126/81  Pulse: 95 (!) 103 71 67  Resp: 18 18  20   Temp: 98.1 F (36.7 C) 97.6 F (36.4 C)  98.1 F (36.7 C)  TempSrc: Oral Oral  Oral  SpO2: 99% 100% 100% 100%  Weight:  138 lb 6.4 oz (62.8 kg)    Height:        Intake/Output Summary (Last 24 hours) at 03/11/16 1241 Last data filed at 03/11/16 1130  Gross per 24 hour  Intake             1735 ml  Output             3825 ml  Net            -2090 ml   Filed Weights   03/09/16 0444 03/10/16 0500 03/11/16 0353  Weight: 142 lb 3.2 oz (64.5 kg) 142 lb 6.4 oz (64.6 kg) 138 lb 6.4 oz (62.8 kg)    Physical Exam    GEN: Well nourished, well developed,Mild increase respiratorydistress.  HEENT: Grossly normal.  Neck: Supple, no JVD, carotid bruits, or masses. Cardiac: Irregularly irregular, no murmurs,  rubs, or gallops. No clubbing, cyanosis, edema.  Radials/DP/PT 2+ and equal bilaterally.  Respiratory Mildly labored, left lower lobe crackles  GI: Soft, nontender, nondistended, BS + x 4. MS: no deformity or atrophy. Skin: warm and dry, no rash. Neuro:  Strength and sensation are intact. Psych: AAOx3.  Normal affect.  Labs    CBC  Recent Labs  03/09/16 1037 03/10/16 0318  WBC 9.8 9.0  HGB 8.2* 7.8*  HCT 24.0* 22.8*  MCV 84.2 84.1  PLT 407* 811*   Basic Metabolic Panel  Recent Labs  03/09/16 1037 03/11/16 0354  NA 125* 130*  K 4.2 4.1  CL 87* 88*  CO2 28 28  GLUCOSE 223* 183*  BUN 46* 50*  CREATININE 1.26* 1.34*  CALCIUM 8.4* 9.1     Recent Labs  03/10/16 0318  HGBA1C 7.3*   Fasting Lipid Panel No results for input(s): CHOL, HDL, LDLCALC, TRIG, CHOLHDL, LDLDIRECT in the last 72 hours. Thyroid Function Tests  Recent Labs  03/11/16 0354   TSH 5.642*    Telemetry    Atrial flutter currently rate controlled - Personally Reviewed  ECG    atrial flutter - Personally Reviewed  Radiology    Dg Chest Port 1 View  Result Date: 03/10/2016 CLINICAL DATA:  Cough for several days with left-sided chest pain, initial encounter EXAM: PORTABLE CHEST 1 VIEW COMPARISON:  03/06/2016 FINDINGS: Cardiac shadow is mildly enlarged but stable. Increasing left lower lobe consolidation is noted with associated effusion. No other focal infiltrate is noted. No bony abnormality is seen. IMPRESSION: Left lower lobe pneumonia with associated effusion. Electronically Signed   By: Inez Catalina M.D.   On: 03/10/2016 14:48    Cardiac Studies   Echocardiogram 01/27/16: - Left ventricle: The cavity size was normal. Wall thickness was   normal. Systolic function was normal. The estimated ejection   fraction was in the range of 55% to 60%. Wall motion was normal;   there were no regional wall motion abnormalities. Doppler   parameters are consistent with abnormal left ventricular   relaxation (grade 1 diastolic dysfunction). - Aortic valve: There was mild regurgitation. - Mitral valve: There was mild regurgitation. - Left atrium: The atrium was moderately dilated.  Impressions:  - Normal LV systolic function; grade 1 diastolic dysfunction; mild   AI; mild MR; moderate LAE.  Nuclear stress test 02/25/14:  - No ischemia.   Patient Profile     Shannon [name] is a 82 year old female with a past medical history of persistent Afib/flutter on Amiodarone, CKD stage III, DM, HTN, chronic diastolic CHF, CVA, gastric outlet obstruction s/p J tube. Multiple recent hospitalizations most recent in Oct. 2017 for rapid aflutter and acute on chronic diastolic CHF.   Presented to the office on 03/02/16 with 8 pound weight gain in 2 days, BLE edema and orthopnea. Admitted for IV diuresis  Assessment & Plan    Acute on chronic diastolic heart failure  - Lasix IV  40 mg every 12 hours.  - Creatinine slightly increased today to 1.34 up from 1.26.  - continue diuresis.  Chronic anticoagulation  - Therapeutic Coumadin  Atrial flutter/atrial fibrillation permanent  - Rate controlled  Aspiration pneumonia  - On IV Zosyn  - Left lower lobe pneumonia noted on x-ray 03/10/16.  - Thin liquids  - Mildly labored respirations.   Gastric outlet obstruction  - J-tube  History of stroke  - Coumadin  Hyponatremia  - 130 up from 125. Seems to be improving  with diuresis.  Chronic anemia of chronic disease  - 7.8  Poor prognosis in review of notes. we will have palliative care team come and discuss possible end-of-life issues with family.   Signed, Candee Furbish, MD  03/11/2016, 12:41 PM

## 2016-03-11 NOTE — Progress Notes (Signed)
ANTICOAGULATION CONSULT NOTE - Follow Up Consult  Pharmacy Consult for Coumadin Indication: Afib/Aflutter  Patient Measurements: Height: 5\' 6"  (167.6 cm) Weight: 138 lb 6.4 oz (62.8 kg) IBW/kg (Calculated) : 59.3   Vital Signs: Temp: 98.1 F (36.7 C) (12/09 1141) Temp Source: Oral (12/09 1141) BP: 126/81 (12/09 1141) Pulse Rate: 67 (12/09 1141)   Assessment: Shannon [name] continues on warfarin for hx afib and recent DVT. INR 1.71 on admit,  Now therapeutic at 2.30.   Of note, pt has required dosing higher inpatient than PTA dosing to achieve therapeutic INR. Hgb low but relatively stable, plt wnl, no S/Sx bleeding documented.  PTA warfarin dosing = 2.5mg  daily  Goal of Therapy:  INR 2-3 Monitor platelets by anticoagulation protocol: Yes   Plan:  -Warfarin 5mg  PO x1 -Monitor daily INR, CBC, s/sx of bleed  Thank you Anette Guarneri, PharmD [phone] 03/11/2016

## 2016-03-11 NOTE — Progress Notes (Signed)
  Notes per Cardiology reviewed and consult from Dr. Aggie Moats assessed.  Patient looks terrible.  She is breathing about 30 x per min.  She is able to verbalize but has a cough with some sputum. No cp, no n/v  eomi sunken eyes JVD ?  s1 s2 slight tachy abd G tube noted Neuro oving all 4 limbs ='lly  Rec -although A1c is 7.3, would not actively control DM -suggest Goals of care this hospital stay.  Think this is a terminal admission for her just looking at her.  Suggest roxanol for work of breathing q 8 [have ordered] -can continue Zosyn, do not think this will make a durable difference in the logn term as PEG tube pre-disposes to recurrent aspiraiton  -I am available if needed but will sign off otherwise   Verneita Griffes, MD [facility] ([phone]

## 2016-03-12 DIAGNOSIS — I509 Heart failure, unspecified: Secondary | ICD-10-CM

## 2016-03-12 DIAGNOSIS — R0602 Shortness of breath: Secondary | ICD-10-CM

## 2016-03-12 DIAGNOSIS — Z515 Encounter for palliative care: Secondary | ICD-10-CM

## 2016-03-12 DIAGNOSIS — Z7189 Other specified counseling: Secondary | ICD-10-CM

## 2016-03-12 DIAGNOSIS — I5084 End stage heart failure: Secondary | ICD-10-CM

## 2016-03-12 LAB — GLUCOSE, CAPILLARY
Glucose-Capillary: 131 mg/dL — ABNORMAL HIGH (ref 65–99)
Glucose-Capillary: 144 mg/dL — ABNORMAL HIGH (ref 65–99)
Glucose-Capillary: 153 mg/dL — ABNORMAL HIGH (ref 65–99)
Glucose-Capillary: 153 mg/dL — ABNORMAL HIGH (ref 65–99)
Glucose-Capillary: 158 mg/dL — ABNORMAL HIGH (ref 65–99)
Glucose-Capillary: 172 mg/dL — ABNORMAL HIGH (ref 65–99)
Glucose-Capillary: 188 mg/dL — ABNORMAL HIGH (ref 65–99)

## 2016-03-12 LAB — HEMOGLOBIN A1C
Hgb A1c MFr Bld: 7.4 % — ABNORMAL HIGH (ref 4.8–5.6)
Mean Plasma Glucose: 166 mg/dL

## 2016-03-12 LAB — CBC
HCT: 27.3 % — ABNORMAL LOW (ref 36.0–46.0)
Hemoglobin: 9.1 g/dL — ABNORMAL LOW (ref 12.0–15.0)
MCH: 28.2 pg (ref 26.0–34.0)
MCHC: 33.3 g/dL (ref 30.0–36.0)
MCV: 84.5 fL (ref 78.0–100.0)
Platelets: 561 10*3/uL — ABNORMAL HIGH (ref 150–400)
RBC: 3.23 MIL/uL — ABNORMAL LOW (ref 3.87–5.11)
RDW: 18 % — ABNORMAL HIGH (ref 11.5–15.5)
WBC: 8.5 10*3/uL (ref 4.0–10.5)

## 2016-03-12 LAB — PROTIME-INR
INR: 2.22
Prothrombin Time: 25 seconds — ABNORMAL HIGH (ref 11.4–15.2)

## 2016-03-12 MED ORDER — WARFARIN SODIUM 2 MG PO TABS
4.0000 mg | ORAL_TABLET | Freq: Every day | ORAL | Status: DC
  Administered 2016-03-12 – 2016-03-13 (×2): 4 mg via ORAL
  Filled 2016-03-12 (×2): qty 2

## 2016-03-12 MED ORDER — SENNA 8.6 MG PO TABS
1.0000 | ORAL_TABLET | Freq: Two times a day (BID) | ORAL | Status: DC
  Administered 2016-03-13: 8.6 mg via ORAL
  Filled 2016-03-12 (×2): qty 1

## 2016-03-12 MED ORDER — MORPHINE SULFATE (CONCENTRATE) 10 MG/0.5ML PO SOLN
5.0000 mg | ORAL | Status: DC | PRN
  Administered 2016-03-12 – 2016-03-13 (×2): 5 mg via SUBLINGUAL
  Filled 2016-03-12 (×2): qty 0.5

## 2016-03-12 NOTE — Progress Notes (Signed)
ANTICOAGULATION CONSULT NOTE - Follow Up Consult  Pharmacy Consult for Coumadin Indication: Afib/Aflutter  Patient Measurements: Height: 5\' 6"  (167.6 cm) Weight: 136 lb 1.6 oz (61.7 kg) (scale c) IBW/kg (Calculated) : 59.3   Vital Signs: Temp: 98.1 F (36.7 C) (12/10 0551) Temp Source: Oral (12/10 0551) BP: 135/56 (12/10 1045) Pulse Rate: 74 (12/10 1045)   Assessment: 75 yoF continues on warfarin for hx afib and recent DVT. INR 1.71 on admit,  Now therapeutic at 2.22  Of note, pt has required dosing higher inpatient than PTA dosing to achieve therapeutic INR.   PTA warfarin dosing = 2.5mg  daily  Goal of Therapy:  INR 2-3 Monitor platelets by anticoagulation protocol: Yes   Plan:  -Warfarin 4 mg po daily -Monitor daily INR, CBC, s/sx of bleed  Thank you Anette Guarneri, PharmD [phone] 03/12/2016

## 2016-03-12 NOTE — Consult Note (Signed)
Consultation Note Date: 03/12/2016   Patient Name: Shannon [name]  DOB: [birth year]  MRN: [id number]  Age / Sex: 82 y.o., female  PCP: Kirt Boys, DO Referring Physician: Lars Masson, MD  Reason for Consultation: Establishing goals of care  HPI/Patient Profile: 82 y.o. female  with past medical history of diabetes, stroke, HTN, DVT, COPD, CKD, CHF, asthma admitted on 03/02/2016 with CHF exacerbation. During admission she has developed aspiration pneumonia. Palliative medicine consulted for GOC.    Clinical Assessment and Goals of Care: Met with patient, her daughter, granddaughter in person. Her son and grandson also participated in meeting via speakerphone. I introduced palliative medicine concepts including symptom management and goals of care. Patient lives at home with her daughter and is cared for by her daughter and granddaughter.  We discussed CHF and disease trajectory including that this is a progressive terminal illness. Noted she has a feeding tube and this is possibly causing reflux with aspiration leading to pneumonia and this is likely to occur again. Discussed concerns that she has limited lifetime left and attempted to elicit her goals of care.  Patient and family named goals of treating what is treatable as well as aggressive symptom management. Patient states she would not want to be kept alive long term on a ventilator or with tube feedings if she were unable to make her own healthcare decisions. Their main goals are to stabilize patient so that she can return to her home. She would be interested in pursuing comfort measures only if she were to become a burden on her family. We discussed hospice care. Patient would be eligible for hospice services, but they decline.  They are receptive to palliative services at home.  We discussed symptom management. Her main complaint is cough and SOB.  Discussed using morphine for SOB and she would like to try this.  Patient granddaughter is a Engineer, civil (consulting) and former Child psychotherapist. She and patient's daughter are very invested in continuing current level of care. They are beginning to grasp that patient is entering end of life. Continued follow up from palliative services outpatient will be beneficial if patient is able to discharge. Palliative medicine will continue to follow for reentry should patient decline further.   Primary Decision Maker PATIENT - with assistance from family- daughter is surrogate    SUMMARY OF RECOMMENDATIONS -Continue current level of care in hopes of stabilizing to be able to return home with [facility] and Palliative care services for symptom management and continued GOC -Morphine concentrate solution 5mg  SL q1hr SOB and premed for exertional activities -Senna 1 tab bid for bowel prophylaxis -Family is requesting evaluation of feeding tube by IR- they note that tube was "tied in a knot" a few months ago and they were told by IR to request consult "anytime"- currently there are no symptoms- pt denies pain, nausea, vomiting- will defer this to attending physician    Code Status/Advance Care Planning:  Limited code    Palliative Prophylaxis:   Aspiration, Bowel Regimen and Frequent Pain Assessment  Additional Recommendations (Limitations, Scope, Preferences):  No Surgical Procedures and No Tracheostomy  Psycho-social/Spiritual:  Desire for further Chaplaincy support:No  Prognosis:    < 6 months d/t progressive decline r/t CHF with increasing, frequent hospitalizations  Discharge Planning: Home with Palliative Services  Primary Diagnoses: Present on Admission: . Acute on chronic diastolic CHF (congestive heart failure) (HCC) . Atrial flutter (HCC) . Chronic kidney insufficiency, stage 3 (moderate) . Gastric outlet obstruction   I have reviewed the medical record, interviewed the patient and family, and examined  the patient. The following aspects are pertinent.  Past Medical History:  Diagnosis Date  . Anemia    previous blood transfusions  . Arthritis    "all over"  . Asthma   . Bradycardia    requiring previous d/c of BB and reduction of amiodarone  . CAD (coronary artery disease)    nonobstructive per notes  . Chronic diastolic CHF (congestive heart failure) (HCC)   . CKD (chronic kidney disease), stage III   . Complication of blood transfusion    "got the wrong blood type at [facility] in ~ 2015; no adverse reaction that we are aware of"/daughter, [name] (01/27/2016)  . COPD (chronic obstructive pulmonary disease) (HCC)   . Depression    "light case"  . DVT (deep venous thrombosis) (HCC) 01/2016   a. LLE DVT 01/2016 - switched from Eliquis to Coumadin.  . Gastric stenosis    a. s/p stomach tube  . GERD (gastroesophageal reflux disease)   . History of blood transfusion    "several" (01/27/2016)  . History of stomach ulcers   . Hyperlipidemia   . Hypertension   . Hypothyroidism   . Paraesophageal hernia   . Perforated gastric ulcer (HCC)   . Seasonal allergies   . SIADH (syndrome of inappropriate ADH production) (HCC)    [name] 01/10/2015  . Small bowel obstruction    "I don't know how many" (01/11/2015)  . Stroke (HCC)    "light one"  . Type II diabetes mellitus (HCC)    "related to prednisone use  for > 20 yr; once predinose stopped; no more DM RX" (01/27/2016)  . Ventral hernia with bowel obstruction    Social History   Social History  . Marital status: Widowed    Spouse name: N/A  . Number of children: 3  . Years of education: N/A   Social History Main Topics  . Smoking status: Never Smoker  . Smokeless tobacco: Former Neurosurgeon    Types: Snuff     Comment: "used snuff in my younger days"  . Alcohol use No  . Drug use: No  . Sexual activity: No   Other Topics Concern  . None   Social History Narrative   Married in [place], lives in one story house with 2 people  and no pets   Occupation: ?   Has a living Will, [place], and doesn't have DNR   Was living with Husband (in frail health) in [place] until 09/2013.  After her [facility] admission they both came to live with daughter in [place]   Family History  Problem Relation Age of Onset  . Stroke Mother   . Hypertension Mother   . Diabetes Brother   . Heart attack Neg Hx    Scheduled Meds: . amiodarone  200 mg Oral Daily  . atorvastatin  10 mg Oral q1800  . benzonatate  200 mg Oral TID  . cycloSPORINE  1 drop Both Eyes BID  . diltiazem  120 mg Oral Daily  . erythromycin   Both Eyes Q6H  . feeding supplement (PRO-STAT SUGAR FREE 64)  30 mL Oral BID BM  . fluticasone  2 spray Each Nare Daily  . free water  200 mL Per Tube Q8H  . furosemide  40 mg Intravenous Q12H  . gabapentin  300 mg Oral TID  . hydrALAZINE  50 mg Oral TID  . insulin aspart  0-9 Units Subcutaneous Q4H  . isosorbide mononitrate  30 mg Oral Daily  . latanoprost  1 drop Both Eyes QHS  . levothyroxine  137 mcg Oral QAC breakfast  . loratadine  10 mg Oral Daily  . mometasone-formoterol  2 puff Inhalation BID  . pantoprazole  40 mg Oral BID  . piperacillin-tazobactam (ZOSYN)  IV  3.375 g Intravenous Q8H  . polyvinyl alcohol  1 drop Both Eyes QID  . sertraline  100 mg Oral Daily  . sodium chloride flush  3 mL Intravenous Q12H  . warfarin  4 mg Oral q1800  . Warfarin - Pharmacist Dosing Inpatient   Does not apply q1800   Continuous Infusions: . feeding supplement (GLUCERNA 1.2 CAL) 1,000 mL (03/11/16 2246)   PRN Meds:.sodium chloride, acetaminophen, albuterol, guaiFENesin-dextromethorphan, ipratropium-albuterol, morphine CONCENTRATE, ondansetron (ZOFRAN) IV, simethicone, sodium chloride flush, traMADol Medications Prior to Admission:  Prior to Admission medications   Medication Sig Start Date End Date Taking? Authorizing Provider  acetaminophen (TYLENOL) 325 MG tablet Take 650 mg by mouth 2 (two) times daily as needed for mild  pain.    Yes Historical Provider, MD  ADVAIR DISKUS 250-50 MCG/DOSE AEPB INHALE 1 PUFF INTO THE LUNGS 2 (TWO) TIMES DAILY AS NEEDED FOR SHORTNESS OF BREATH 12/01/14  Yes Historical Provider, MD  AMINO ACIDS-PROTEIN HYDROLYS PO Take 30 mLs by mouth 2 (two) times daily.   Yes Historical Provider, MD  amiodarone (PACERONE) 200 MG tablet Take 200 mg by mouth daily.   Yes Historical Provider, MD  amLODipine (NORVASC) 10 MG tablet TAKE 1 TABLET BY MOUTH DAILY FOR HYPERTENSION 02/21/16  Yes Kirt Boys, DO  atorvastatin (LIPITOR) 10 MG tablet Take 10 mg by mouth daily.   Yes Historical Provider, MD  b complex vitamins tablet Take 1 tablet by mouth daily.    Yes Historical Provider, MD  bisacodyl (DULCOLAX) 10 MG suppository Place 1 suppository (10 mg total) rectally daily as needed for moderate constipation. 09/30/15  Yes Calvert Cantor, MD  calcium-vitamin D (OSCAL WITH D) 500-200 MG-UNIT per tablet Take 1 tablet by mouth daily with breakfast.    Yes Historical Provider, MD  cefUROXime (CEFTIN) 250 MG tablet Take 250 mg by mouth 2 (two) times daily. 02/28/16  Yes Historical Provider, MD  cetirizine (ZYRTEC) 10 MG tablet Take 10 mg by mouth daily as needed for allergies or rhinitis.   Yes Historical Provider, MD  chlorhexidine (PERIDEX) 0.12 % solution Use as directed 15 mLs in the mouth or throat 2 (two) times daily.   Yes Historical Provider, MD  diltiazem (CARDIZEM CD) 120 MG 24 hr capsule Take 1 capsule (120 mg total) by mouth daily. 02/23/16 05/23/16 Yes Hillis Range, MD  feeding supplement (BOOST / RESOURCE BREEZE) LIQD Take 1 Container by mouth daily. 11/11/15  Yes Rodolph Bong, MD  fluticasone [facility]) 50 MCG/ACT nasal spray Place 2 sprays into both nostrils daily. 08/07/14  Yes Kirt Boys, DO  gabapentin (NEURONTIN) 300 MG capsule Take 1 capsule (300 mg total) by mouth 3 (three) times daily. 02/02/16  Yes Denny Peon  Candy Sledge, NP  hydrALAZINE (APRESOLINE) 50 MG tablet Take 1 tablet (50 mg total) by mouth  3 (three) times daily. 11/11/15  Yes Rodolph Bong, MD  HYDROcodone-acetaminophen (NORCO) 5-325 MG tablet Take 1 tablet by mouth every 6 (six) hours as needed for moderate pain. 02/18/16  Yes Monica Carter, DO  ipratropium-albuterol (DUONEB) 0.5-2.5 (3) MG/3ML SOLN Take 3 mLs by nebulization every 6 (six) hours as needed (sob). Use 3 times daily x 4 days then every 6 hours as needed. 11/11/15  Yes Rodolph Bong, MD  isosorbide mononitrate (IMDUR) 30 MG 24 hr tablet TAKE 1 TABLET (30 MG TOTAL) BY MOUTH DAILY. 06/22/15  Yes Historical Provider, MD  latanoprost (XALATAN) 0.005 % ophthalmic solution Place 1 drop into both eyes at bedtime. 06/02/14  Yes Mahima Glade Lloyd, MD  levalbuterol [facility] HFA) 45 MCG/ACT inhaler Inhale 2 puffs into the lungs every 6 (six) hours as needed for wheezing or shortness of breath. 02/03/16  Yes Kirt Boys, DO  levothyroxine (SYNTHROID, LEVOTHROID) 137 MCG tablet Take 1 tablet (137 mcg total) by mouth daily before breakfast. 12/27/15  Yes Kirt Boys, DO  Maltodextrin-Xanthan Gum (RESOURCE THICKENUP CLEAR) POWD Take 120 g by mouth as needed (use to thicken liquids.). 11/11/15  Yes Rodolph Bong, MD  Multiple Vitamin (MULTIVITAMIN WITH MINERALS) TABS tablet Take 1 tablet by mouth daily.   Yes Historical Provider, MD  Nutritional Supplements (FEEDING SUPPLEMENT, OSMOLITE 1.5 CAL,) LIQD Place 1,000 mLs into feeding tube daily. 09/30/15  Yes Calvert Cantor, MD  Polyethyl Glycol-Propyl Glycol 0.4-0.3 % SOLN Place 1 drop into both eyes 4 (four) times daily.    Yes Historical Provider, MD  polyethylene glycol powder (GLYCOLAX/MIRALAX) powder MIX 17GRAMS IN 8 OUNCES OF LIQUID AND DRINK TWICE DAILY AS NEEDED 09/13/15  Yes Monica Carter, DO  RESTASIS 0.05 % ophthalmic emulsion USE 1 DROP INTO BOTH EYES TWICE DAILY 11/01/15  Yes Kimber Relic, MD  sertraline (ZOLOFT) 100 MG tablet Take one tablet by mouth once daily for depression 09/03/15  Yes Kirt Boys, DO  sodium chloride 1 g  tablet Take 1 tablet (1 g total) by mouth 2 (two) times daily with a meal. 02/28/16  Yes Gilda Crease, MD  traMADol (ULTRAM) 50 MG tablet Take 50 mg by mouth daily as needed for moderate pain.    Yes Historical Provider, MD  warfarin (COUMADIN) 2.5 MG tablet Take as directed by [facility]. Patient taking differently: Take 3.75 mg by mouth daily at 6 PM. Take as directed by [facility]madin Clinic. 02/16/16  Yes Lars Masson, MD  Water For Irrigation, Sterile (FREE WATER) SOLN Place 230 mLs into feeding tube 3 (three) times daily.   Yes Historical Provider, MD  pantoprazole (PROTONIX) 40 MG tablet TAKE 1 TABLET BY MOUTH TWICE A DAY 03/09/16   Kirt Boys, DO   Allergies  Allergen Reactions  . Penicillins Itching    Has patient had a PCN reaction causing immediate rash, facial/tongue/throat swelling, SOB or lightheadedness with hypotension: Unknown Has patient had a PCN reaction causing severe rash involving mucus membranes or skin necrosis: Unknown Has patient had a PCN reaction that required hospitalization: Unknown Has patient had a PCN reaction occurring within the last 10 years: Unknown If all of the above answers are "NO", then may proceed with Cephalosporin use.    Review of Systems  Constitutional: Positive for activity change, appetite change and fatigue.  Respiratory: Positive for cough and shortness of breath.   All other systems reviewed  and are negative.   Physical Exam  Constitutional: She is oriented to person, place, and time. No distress.  Frail elderly lady lying in bed  Cardiovascular: Normal rate, regular rhythm and intact distal pulses.   Pulmonary/Chest:  Sob at rest  Abdominal: Soft. Bowel sounds are normal.  Musculoskeletal: She exhibits no edema.  Generalized weakness   Neurological: She is alert and oriented to person, place, and time.  Skin: Skin is warm and dry. She is not diaphoretic.  Psychiatric: She has a normal mood and affect. Her  behavior is normal. Judgment and thought content normal.    Vital Signs: BP (!) 128/53 (BP Location: Right Arm)   Pulse 75   Temp 97.9 F (36.6 C) (Oral)   Resp 20   Ht 5\' 6"  (1.676 m)   Wt 61.7 kg (136 lb 1.6 oz) Comment: scale c  SpO2 100%   BMI 21.97 kg/m  Pain Assessment: No/denies pain   Pain Score: 0-No pain   SpO2: SpO2: 100 % O2 Device:SpO2: 100 % O2 Flow Rate: .O2 Flow Rate (L/min): 2 L/min  IO: Intake/output summary:  Intake/Output Summary (Last 24 hours) at 03/12/16 1553 Last data filed at 03/12/16 1456  Gross per 24 hour  Intake              480 ml  Output             1776 ml  Net            -1296 ml    LBM: Last BM Date: 03/12/16 Baseline Weight: Weight: 71 kg (156 lb 9.6 oz) Most recent weight: Weight: 61.7 kg (136 lb 1.6 oz) (scale c)     Palliative Assessment/Data: PPS: 40%     Thank you for this consult. Palliative medicine will continue to follow and assist as needed.   Time In: 1330 Time Out: 1445 Time Total: 75 minutes Greater than 50%  of this time was spent counseling and coordinating care related to the above assessment and plan.  Signed by: Ocie Bob, AGNP-C Palliative Medicine    Please contact Palliative Medicine Team phone at [phone] for questions and concerns.  For individual provider: See Loretha Stapler

## 2016-03-12 NOTE — Progress Notes (Signed)
Patient Name: Shannon [name] Date of Encounter: 03/12/2016  Primary Cardiologist: Dr. Rhona Raider Problem List     Principal Problem:   Acute on chronic diastolic CHF (congestive heart failure) (HCC) Active Problems:   Chronic kidney insufficiency, stage 3 (moderate)   Diabetes (HCC)   Gastric outlet obstruction   Dysphagia   Atrial flutter (HCC)   Warfarin anticoagulation   PEG (percutaneous endoscopic gastrostomy) status ([place])   Pressure injury of skin     Subjective   Started on Zosyn for potential aspiration pneumonia. Thin liquids were recommended by swallow evaluation.Daughter wonders if thickened liquids would be helpful. She does have a J-tube in place.  I appreciate Dr. Verlon Au seeing patient for diabetes. He was quite concerned about her overall status, prognosis, and possible terminal illness.  She appears mild to moderately labored with her breathing. She thinks that she is feeling better today.      Inpatient Medications    Scheduled Meds: . amiodarone  200 mg Oral Daily  . atorvastatin  10 mg Oral q1800  . benzonatate  200 mg Oral TID  . cycloSPORINE  1 drop Both Eyes BID  . diltiazem  120 mg Oral Daily  . erythromycin   Both Eyes Q6H  . feeding supplement (PRO-STAT SUGAR FREE 64)  30 mL Oral BID BM  . fluticasone  2 spray Each Nare Daily  . free water  200 mL Per Tube Q8H  . furosemide  40 mg Intravenous Q12H  . gabapentin  300 mg Oral TID  . hydrALAZINE  50 mg Oral TID  . insulin aspart  0-9 Units Subcutaneous Q4H  . isosorbide mononitrate  30 mg Oral Daily  . latanoprost  1 drop Both Eyes QHS  . levothyroxine  137 mcg Oral QAC breakfast  . loratadine  10 mg Oral Daily  . mometasone-formoterol  2 puff Inhalation BID  . pantoprazole  40 mg Oral BID  . piperacillin-tazobactam (ZOSYN)  IV  3.375 g Intravenous Q8H  . polyvinyl alcohol  1 drop Both Eyes QID  . sertraline  100 mg Oral Daily  . sodium chloride flush  3 mL Intravenous Q12H  .  warfarin  4 mg Oral q1800  . Warfarin - Pharmacist Dosing Inpatient   Does not apply q1800   Continuous Infusions: . feeding supplement (GLUCERNA 1.2 CAL) 1,000 mL (03/11/16 2246)   PRN Meds: sodium chloride, acetaminophen, albuterol, guaiFENesin-dextromethorphan, ipratropium-albuterol, morphine CONCENTRATE, ondansetron (ZOFRAN) IV, simethicone, sodium chloride flush, traMADol   Vital Signs    Vitals:   03/11/16 2046 03/12/16 0551 03/12/16 0753 03/12/16 1045  BP: 99/63 125/65  (!) 135/56  Pulse: 67 73  74  Resp: 20 20    Temp: 97.8 F (36.6 C) 98.1 F (36.7 C)    TempSrc: Oral Oral    SpO2: 100% 100% 100% 100%  Weight:  136 lb 1.6 oz (61.7 kg)    Height:        Intake/Output Summary (Last 24 hours) at 03/12/16 1151 Last data filed at 03/12/16 0946  Gross per 24 hour  Intake              240 ml  Output             1875 ml  Net            -1635 ml   Filed Weights   03/10/16 0500 03/11/16 0353 03/12/16 0551  Weight: 142 lb 6.4 oz (64.6 kg) 138 lb 6.4 oz (62.8  kg) 136 lb 1.6 oz (61.7 kg)    Physical Exam    GEN: Well nourished, well developed,Mild increase respiratorydistress.  HEENT: Grossly normal.  Neck: Supple, no JVD, carotid bruits, or masses. Cardiac: Irregularly irregular, no murmurs, rubs, or gallops. No clubbing, cyanosis, edema.  Radials/DP/PT 2+ and equal bilaterally.  Respiratory Mildly labored, left lower lobe crackles  GI: Soft, nontender, nondistended, BS + x 4. MS: no deformity or atrophy. Skin: warm and dry, no rash. Neuro:  Strength and sensation are intact. Psych: AAOx3.  Normal affect.  Labs    CBC  Recent Labs  03/10/16 0318 03/12/16 0741  WBC 9.0 8.5  HGB 7.8* 9.1*  HCT 22.8* 27.3*  MCV 84.1 84.5  PLT 405* 683*   Basic Metabolic Panel  Recent Labs  03/11/16 0354  NA 130*  K 4.1  CL 88*  CO2 28  GLUCOSE 183*  BUN 50*  CREATININE 1.34*  CALCIUM 9.1     Recent Labs  03/10/16 0318  HGBA1C 7.3*   Fasting Lipid  Panel No results for input(s): CHOL, HDL, LDLCALC, TRIG, CHOLHDL, LDLDIRECT in the last 72 hours. Thyroid Function Tests  Recent Labs  03/11/16 0354  TSH 5.642*    Telemetry    Atrial flutter currently rate controlled - Personally Reviewed  ECG    atrial flutter - Personally Reviewed  Radiology    Dg Chest Port 1 View  Result Date: 03/10/2016 CLINICAL DATA:  Cough for several days with left-sided chest pain, initial encounter EXAM: PORTABLE CHEST 1 VIEW COMPARISON:  03/06/2016 FINDINGS: Cardiac shadow is mildly enlarged but stable. Increasing left lower lobe consolidation is noted with associated effusion. No other focal infiltrate is noted. No bony abnormality is seen. IMPRESSION: Left lower lobe pneumonia with associated effusion. Electronically Signed   By: Inez Catalina M.D.   On: 03/10/2016 14:48    Cardiac Studies   Echocardiogram 01/27/16: - Left ventricle: The cavity size was normal. Wall thickness was   normal. Systolic function was normal. The estimated ejection   fraction was in the range of 55% to 60%. Wall motion was normal;   there were no regional wall motion abnormalities. Doppler   parameters are consistent with abnormal left ventricular   relaxation (grade 1 diastolic dysfunction). - Aortic valve: There was mild regurgitation. - Mitral valve: There was mild regurgitation. - Left atrium: The atrium was moderately dilated.  Impressions:  - Normal LV systolic function; grade 1 diastolic dysfunction; mild   AI; mild MR; moderate LAE.  Nuclear stress test 02/25/14:  - No ischemia.   Patient Profile     Shannon [name] is a 82 year old female with a past medical history of persistent Afib/flutter on Amiodarone, CKD stage III, DM, HTN, chronic diastolic CHF, CVA, gastric outlet obstruction s/p J tube. Multiple recent hospitalizations most recent in Oct. 2017 for rapid aflutter and acute on chronic diastolic CHF.   Presented to the office on 03/02/16 with 8  pound weight gain in 2 days, BLE edema and orthopnea. Admitted for IV diuresis  Assessment & Plan    Acute on chronic diastolic heart failure  - Lasix IV 40 mg every 12 hours. Out 4.1L. Continue with Lasix  - Creatinine slightly increased today to 1.34 up from 1.26.  - continue diuresis. Checking basic metabolic profile in the morning  Chronic anticoagulation  - Therapeutic Coumadin  Atrial flutter/atrial fibrillation permanent  - Rate controlled  Aspiration pneumonia  - On IV Zosyn  - Left lower  lobe pneumonia noted on x-ray 03/10/16.  - Thin liquids - Recommended by speech. Daughter is potentially concerned that she should be on thickened liquids. She does have a J-tube.  - Mildly labored respirations.   Gastric outlet obstruction  - J-tube. Can lead to aspiration.  History of stroke  - Coumadin  Hyponatremia  - 130 up from 125. Seems to be improving with diuresis.  Chronic anemia of chronic disease  - 7.8  Poor prognosis in review of notes. we will have palliative care team come and discuss possible end-of-life issues with family.   Signed, Candee Furbish, MD  03/12/2016, 11:51 AM

## 2016-03-12 NOTE — Progress Notes (Signed)
Patient with no complaints during 7 a to 7 p shift, states her shortness of breath has improved today as well as cough.  Family in to visit patient which patient enjoyed.  Up and down multiple times to [facility], had a BM with each urination.  Patient does get more short of breath with exertion, O2 sats remain in high 90's on 2 liters.

## 2016-03-13 ENCOUNTER — Inpatient Hospital Stay (HOSPITAL_COMMUNITY): Payer: Medicare Other

## 2016-03-13 ENCOUNTER — Inpatient Hospital Stay (HOSPITAL_COMMUNITY): Admission: RE | Admit: 2016-03-13 | Payer: Medicare Other | Source: Ambulatory Visit

## 2016-03-13 DIAGNOSIS — Z7901 Long term (current) use of anticoagulants: Secondary | ICD-10-CM

## 2016-03-13 DIAGNOSIS — Z515 Encounter for palliative care: Secondary | ICD-10-CM

## 2016-03-13 DIAGNOSIS — R1310 Dysphagia, unspecified: Secondary | ICD-10-CM

## 2016-03-13 DIAGNOSIS — Z931 Gastrostomy status: Secondary | ICD-10-CM

## 2016-03-13 DIAGNOSIS — I509 Heart failure, unspecified: Secondary | ICD-10-CM

## 2016-03-13 LAB — GLUCOSE, CAPILLARY
Glucose-Capillary: 182 mg/dL — ABNORMAL HIGH (ref 65–99)
Glucose-Capillary: 185 mg/dL — ABNORMAL HIGH (ref 65–99)
Glucose-Capillary: 175 mg/dL — ABNORMAL HIGH (ref 65–99)
Glucose-Capillary: 137 mg/dL — ABNORMAL HIGH (ref 65–99)
Glucose-Capillary: 180 mg/dL — ABNORMAL HIGH (ref 65–99)
Glucose-Capillary: 182 mg/dL — ABNORMAL HIGH (ref 65–99)

## 2016-03-13 LAB — CBC
HCT: 27.7 % — ABNORMAL LOW (ref 36.0–46.0)
Hemoglobin: 9.4 g/dL — ABNORMAL LOW (ref 12.0–15.0)
MCH: 28.6 pg (ref 26.0–34.0)
MCHC: 33.9 g/dL (ref 30.0–36.0)
MCV: 84.2 fL (ref 78.0–100.0)
Platelets: 577 10*3/uL — ABNORMAL HIGH (ref 150–400)
RBC: 3.29 MIL/uL — ABNORMAL LOW (ref 3.87–5.11)
RDW: 17.8 % — ABNORMAL HIGH (ref 11.5–15.5)
WBC: 10 10*3/uL (ref 4.0–10.5)

## 2016-03-13 LAB — BASIC METABOLIC PANEL
Anion gap: 17 — ABNORMAL HIGH (ref 5–15)
BUN: 64 mg/dL — ABNORMAL HIGH (ref 6–20)
CO2: 22 mmol/L (ref 22–32)
Calcium: 9.1 mg/dL (ref 8.9–10.3)
Chloride: 92 mmol/L — ABNORMAL LOW (ref 101–111)
Creatinine, Ser: 1.74 mg/dL — ABNORMAL HIGH (ref 0.44–1.00)
GFR calc Af Amer: 29 mL/min — ABNORMAL LOW (ref >60)
GFR calc non Af Amer: 25 mL/min — ABNORMAL LOW (ref >60)
Glucose, Bld: 181 mg/dL — ABNORMAL HIGH (ref 65–99)
Potassium: 4.5 mmol/L (ref 3.5–5.1)
Sodium: 131 mmol/L — ABNORMAL LOW (ref 135–145)

## 2016-03-13 LAB — PROTIME-INR
INR: 2.15
Prothrombin Time: 24.4 seconds — ABNORMAL HIGH (ref 11.4–15.2)

## 2016-03-13 MED ORDER — GLUCERNA SHAKE PO LIQD
237.0000 mL | ORAL | Status: DC
  Administered 2016-03-14: 237 mL via ORAL

## 2016-03-13 MED ORDER — IOPAMIDOL (ISOVUE-300) INJECTION 61%
50.0000 mL | Freq: Once | INTRAVENOUS | Status: AC | PRN
  Administered 2016-03-13: 50 mL

## 2016-03-13 MED ORDER — IOPAMIDOL (ISOVUE-300) INJECTION 61%
INTRAVENOUS | Status: AC
  Administered 2016-03-13: 50 mL
  Filled 2016-03-13: qty 50

## 2016-03-13 NOTE — Progress Notes (Signed)
Patient Name: Shannon [name] Date of Encounter: 03/13/2016  Primary Cardiologist: Dr. Rhona Raider Problem List     Principal Problem:   Acute on chronic diastolic CHF (congestive heart failure) (HCC) Active Problems:   Chronic kidney insufficiency, stage 3 (moderate)   Diabetes (HCC)   Gastric outlet obstruction   Dysphagia   Atrial flutter (HCC)   Warfarin anticoagulation   PEG (percutaneous endoscopic gastrostomy) status (HCC)   Pressure injury of skin   CHF (NYHA class IV, ACC/AHA stage D) ([place])   Advance care planning   Palliative care by specialist   Shortness of breath    Subjective   Feels ok, still has labored breathing (her baseline appears labored however). Denies chest pain.   Inpatient Medications    Scheduled Meds: . amiodarone  200 mg Oral Daily  . atorvastatin  10 mg Oral q1800  . benzonatate  200 mg Oral TID  . cycloSPORINE  1 drop Both Eyes BID  . diltiazem  120 mg Oral Daily  . erythromycin   Both Eyes Q6H  . feeding supplement (PRO-STAT SUGAR FREE 64)  30 mL Oral BID BM  . fluticasone  2 spray Each Nare Daily  . free water  200 mL Per Tube Q8H  . furosemide  40 mg Intravenous Q12H  . gabapentin  300 mg Oral TID  . hydrALAZINE  50 mg Oral TID  . insulin aspart  0-9 Units Subcutaneous Q4H  . isosorbide mononitrate  30 mg Oral Daily  . latanoprost  1 drop Both Eyes QHS  . levothyroxine  137 mcg Oral QAC breakfast  . loratadine  10 mg Oral Daily  . mometasone-formoterol  2 puff Inhalation BID  . pantoprazole  40 mg Oral BID  . piperacillin-tazobactam (ZOSYN)  IV  3.375 g Intravenous Q8H  . polyvinyl alcohol  1 drop Both Eyes QID  . senna  1 tablet Oral BID  . sertraline  100 mg Oral Daily  . sodium chloride flush  3 mL Intravenous Q12H  . warfarin  4 mg Oral q1800  . Warfarin - Pharmacist Dosing Inpatient   Does not apply q1800   Continuous Infusions: . feeding supplement (GLUCERNA 1.2 CAL) 1,000 mL (03/12/16 2214)   PRN Meds: sodium  chloride, acetaminophen, albuterol, guaiFENesin-dextromethorphan, ipratropium-albuterol, morphine CONCENTRATE, ondansetron (ZOFRAN) IV, simethicone, sodium chloride flush, traMADol   Vital Signs    Vitals:   03/12/16 1157 03/12/16 2009 03/12/16 2033 03/13/16 0606  BP: (!) 128/53 (!) 112/53  126/62  Pulse: 75 90  81  Resp: 20 20  18   Temp: 97.9 F (36.6 C) 98.7 F (37.1 C)  98.3 F (36.8 C)  TempSrc: Oral Oral  Oral  SpO2: 100% 100% 99% 100%  Weight:    135 lb 8 oz (61.5 kg)  Height:        Intake/Output Summary (Last 24 hours) at 03/13/16 0738 Last data filed at 03/13/16 0600  Gross per 24 hour  Intake              840 ml  Output             2577 ml  Net            -1737 ml   Filed Weights   03/11/16 0353 03/12/16 0551 03/13/16 0606  Weight: 138 lb 6.4 oz (62.8 kg) 136 lb 1.6 oz (61.7 kg) 135 lb 8 oz (61.5 kg)    Physical Exam   GEN: Elderly female in no acute  distress.  HEENT: Grossly normal.  Neck: Supple, no JVD, carotid bruits, or masses. Cardiac: IRRR, no murmurs, rubs, or gallops. No clubbing, cyanosis, edema.  Radials/DP/PT 2+ and equal bilaterally.  Respiratory:  Respirations regular and labored, Diffuse rhonchi  GI: Soft, nontender, nondistended, BS + x 4. MS: no deformity or atrophy. Skin: warm and dry, no rash. Neuro:  Strength and sensation are intact. Psych: AAOx3.  Normal affect.  Labs    CBC  Recent Labs  03/12/16 0741 03/13/16 0514  WBC 8.5 10.0  HGB 9.1* 9.4*  HCT 27.3* 27.7*  MCV 84.5 84.2  PLT 561* 144*   Basic Metabolic Panel  Recent Labs  03/11/16 0354 03/13/16 0514  NA 130* 131*  K 4.1 4.5  CL 88* 92*  CO2 28 22  GLUCOSE 183* 181*  BUN 50* 64*  CREATININE 1.34* 1.74*  CALCIUM 9.1 9.1   Hemoglobin A1C  Recent Labs  03/11/16 0354  HGBA1C 7.4*   Thyroid Function Tests  Recent Labs  03/11/16 0354  TSH 5.642*    Telemetry     3:1 Atrial Flutter- Personally Reviewed    Radiology    No results  found.  Cardiac Studies  Echocardiogram 01/27/16: - Left ventricle: The cavity size was normal. Wall thickness was normal. Systolic function was normal. The estimated ejection fraction was in the range of 55% to 60%. Wall motion was normal; there were no regional wall motion abnormalities. Doppler parameters are consistent with abnormal left ventricular relaxation (grade 1 diastolic dysfunction). - Aortic valve: There was mild regurgitation. - Mitral valve: There was mild regurgitation. - Left atrium: The atrium was moderately dilated.  Impressions:  - Normal LV systolic function; grade 1 diastolic dysfunction; mild AI; mild MR; moderate LAE.  Nuclear stress test 02/25/14:  - No ischemia.    Patient Profile     Shannon [name] is a 82 year old female with a past medical history of persistent Afib/flutter on Amiodarone, CKD stage III, DM, HTN, chronic diastolic CHF, CVA, gastric outlet obstruction s/p J tube. Multiple recent hospitalizations most recent in Oct. 2017 for rapid aflutter and acute on chronic diastolic CHF.   Presented to the office on 03/02/16 with 8 pound weight gain in 2 days, BLE edema and orthopnea. Admitted for IV diuresis, has chronic hyponatremia, palliative care involved for goals of care.  Assessment & Plan    1. Acute on chronic diastolic heart failure: Negative 5 L. Says her breathing has improved. Still not able to lie flat. Getting IV diuresis, I suspect some of her labored breathing is related to her aspiration PNA. I would put her on po Lasix 40mg  BID, and continue SL Morphine for increased work of breathing.   Weight down 21 pounds from admission. The family would like to take her home and receive Hospice care in the home, Palliative Care has been consulted.   2. Chronic anticoagulation/Atrial Flutter: Rate controlled, in 3:1 Flutter. Patient is on Coumadin, INR is therapeutic. Will continue same.   3. Aspiration pneumonia  - On IV Zosyn,  would switch to Augmentin 500mg /125mg  po q 12 hours, discussed PCN allergy with Pharmacy who felt that it was safe to due Augmentin. (patient had rash with Amox).   - Left lower lobe pneumonia noted on x-ray 03/10/16.  4. Gastric outlet obstruction  - J-tube. Can lead to aspiration - however her poor overall health can also be set up for aspiration  5. History of stroke  - Coumadin  6. Hyponatremia: Improved  7. Chronic anemia of chronic disease    Signed, Arbutus Leas, NP  03/13/2016, 7:38 AM    I have seen, examined and evaluated the patient this AM along with Jettie Booze, NP.  After reviewing all the available data and chart, we discussed the patients laboratory, study & physical findings as well as symptoms in detail. I agree with her findings, examination as well as impression recommendations as per our discussion.    Clinically she seems to be low but better. Still very frail, but seems to be relatively euvolemic at this point. I agree with converting to by mouth Lasix. I also think we need to consider switching him to oral antibiotics in order to have plans for her to potentially go home. I appreciate palliative care consult involvement. The daughter seems very fixated on the patient's feeding tube and we have asked radiology to evaluate dyspneic should've no problems. I think is concerned because of her aspiration. I tried explained to her aspiration pneumonia without having anything with the feeding tube. It is simply part of the downtrend of failure to thrive.  Plan will be to work towards d/c home tomorrow with home Palliative Care assistance.   Shannon [name], M.D., M.S. Interventional Cardiologist   Pager # [phone] Phone # [phone] [address] [place], [place] [address]

## 2016-03-13 NOTE — Progress Notes (Signed)
Patient still experiencing shortness of breath. Reports it is not as severe as it has been in previous days. Patient refused PRN morphine. Patient also reported tenderness in stomach. Skin assessed around peg tube. Peg tube flushed without resistance. Patient tolerated tube feeding well. Patient had loose stool with each urination. Got up to the [facility]. Sats remained in the 90's

## 2016-03-13 NOTE — Progress Notes (Signed)
Pharmacy Antibiotic Note  Shannon [name] is a 82 y.o. female admitted on 03/02/2016 with aspiration pneumia.  Pharmacy has been consulted for zosyn dosing.  Patient afebrile, wbc 10. SCr has bumped up significantly following diuresis, current CrCl ~ 20, borderline for adjustment. Weight down ~ 20 lbs since admission. Will leave dose for today and f/u SCr trend tomorrow. If no improvement, will consider decreased dosing.  Plan: Continue Zosyn 3.375g IV q8 hours F/u daily SCr Monitor clinical progress, renal function, abx plan/LOT   Height: 5\' 6"  (167.6 cm) Weight: 135 lb 8 oz (61.5 kg) IBW/kg (Calculated) : 59.3  Temp (24hrs), Avg:98.3 F (36.8 C), Min:97.9 F (36.6 C), Max:98.7 F (37.1 C)   Recent Labs Lab 03/07/16 0554 03/08/16 1027 03/09/16 1037 03/10/16 0318 03/11/16 0354 03/12/16 0741 03/13/16 0514  WBC 10.2  --  9.8 9.0  --  8.5 10.0  CREATININE 1.34* 1.33* 1.26*  --  1.34*  --  1.74*    Estimated Creatinine Clearance: 20.1 mL/min (by C-G formula based on SCr of 1.74 mg/dL (H)).    Allergies  Allergen Reactions  . Penicillins Itching    Has patient had a PCN reaction causing immediate rash, facial/tongue/throat swelling, SOB or lightheadedness with hypotension: Unknown Has patient had a PCN reaction causing severe rash involving mucus membranes or skin necrosis: Unknown Has patient had a PCN reaction that required hospitalization: Unknown Has patient had a PCN reaction occurring within the last 10 years: Unknown If all of the above answers are "NO", then may proceed with Cephalosporin use.    Thank you for allowing [place] to participate in this patients care. Jens Som, PharmD Pager: [phone] 03/13/2016 10:39 AM

## 2016-03-13 NOTE — Progress Notes (Signed)
Nutrition Follow-up   INTERVENTION:  Glucerna 1.2 @ 108m/hr via J port of GJ tube atrate of 682mhr for 12 hours daily from 2000 hr to 0800 hr.   Provide 30 ml Prostat BID via J-tube.   Tube feeding regimen provides 1136kcal (87% of estimated needs), 77grams of protein (100% of estimated needs), and 63244mf H2O.   Provide Glucerna Shake once daily, provides 220 kcal and 10 grams of protein   NUTRITION DIAGNOSIS:   Inadequate oral intake related to altered GI function as evidenced by other (see comment) (Full liquid diet).  ongoing  GOAL:   Patient will meet greater than or equal to 90% of their needs  Being met  MONITOR:   PO intake, TF tolerance, Skin, I & O's, Labs, Weight trends  REASON FOR ASSESSMENT:   Consult Enteral/tube feeding initiation and management  ASSESSMENT:   89 3ar old female  history of persistent atrial fibrillation on amiodarone, bradycardia, CKD stage III, hypothyroidism, non-obstructive coronary artery disease, type 2 diabetes, hypertension, chronic diastolic CHF, stroke, cerebrovascular disease, COPD, asthma, gastric outlet obstruction s/p prior J tube, anemia. Presents with acute respiratory failure sec to acute on chronic diastolic CHF.  TF formula was changed from Osmolite 1.5 to Glucerna 1.2 by MD on 03/10/16 due to hyperglycemia. TF order is to provide Glucerna 1.2 @ 65 ml/hr continuously, but TF turned off at time of RD visit and pt reports that she continues to receive RD at night only for 12 hours. Pt denies any issues with new TF formula. New TF regimen with Pro-stat BID provides 1136 kcal (87% of estimated calorie needs) and 77 grams of protein (100% of estimated needs).   Labs: glucose ranging 137 to 225 mg/dL, low sodium, low chloride, elevated BUN/creatinine, low hemoglobin  Diet Order:  Diet full liquid Room service appropriate? Yes; Fluid consistency: Thin  Skin:  Wound (see comment) (Stage I pressure ulcer)  Last BM:   12/11  Height:   Ht Readings from Last 1 Encounters:  03/02/16 _0  (1.676 m)    Weight:   Wt Readings from Last 1 Encounters:  03/13/16 135 lb 8 oz (61.5 kg)    Ideal Body Weight:  59.1 kg  BMI:  Body mass index is 21.87 kg/m.  Estimated Nutritional Needs:   Kcal:  1300-1500  Protein:  70-85 grams  Fluid:  1.5 L/day  EDUCATION NEEDS:   No education needs identified at this time  ReaOhiovilleSP, LDN Inpatient Clinical Dietitian Pager: [phone] Hours Pager: [phone]

## 2016-03-13 NOTE — Progress Notes (Addendum)
ANTICOAGULATION CONSULT NOTE - Follow Up Consult  Pharmacy Consult for warfarin Indication: atrial fibrillation  Allergies  Allergen Reactions  . Penicillins Itching    Has patient had a PCN reaction causing immediate rash, facial/tongue/throat swelling, SOB or lightheadedness with hypotension: Unknown Has patient had a PCN reaction causing severe rash involving mucus membranes or skin necrosis: Unknown Has patient had a PCN reaction that required hospitalization: Unknown Has patient had a PCN reaction occurring within the last 10 years: Unknown If all of the above answers are "NO", then may proceed with Cephalosporin use.     Patient Measurements: Height: 5\' 6"  (167.6 cm) Weight: 135 lb 8 oz (61.5 kg) IBW/kg (Calculated) : 59.3  Vital Signs: Temp: 98.3 F (36.8 C) (12/11 0606) Temp Source: Oral (12/11 0606) BP: 126/62 (12/11 0606) Pulse Rate: 81 (12/11 0606)  Labs:  Recent Labs  03/11/16 0354 03/12/16 0152 03/12/16 0741 03/13/16 0514  HGB  --   --  9.1* 9.4*  HCT  --   --  27.3* 27.7*  PLT  --   --  561* 577*  LABPROT 25.7* 25.0*  --  24.4*  INR 2.30 2.22  --  2.15  CREATININE 1.34*  --   --  1.74*    Estimated Creatinine Clearance: 20.1 mL/min (by C-G formula based on SCr of 1.74 mg/dL (H)).   Medications:  See EMR.  Assessment: 67 yoF continues on warfarin for hx afib and recent DVT. INR 1.71 on admit.  Continues to be therapeutic at 2.15 today. CBC stable, no bleeding noted. PO intake 75-85%. On amiodarone and levothyroxine continued from PTA.  Goal of Therapy:  INR 2-3 Monitor platelets by anticoagulation protocol: Yes   Plan:  Give warfarin 4 mg po daily Monitor daily INR, CBC, clinical course, s/sx of bleed, PO intake, DDI   Thank you for allowing [place] to participate in this patients care. Jens Som, PharmD Pager: [phone] 03/13/2016,10:27 AM

## 2016-03-13 NOTE — Progress Notes (Signed)
Physical Therapy Treatment Patient Details Name: Shannon [name] MRN: [id number] DOB: [birth year] Today's Date: 03/13/2016    History of Present Illness Shannon [name] is a 82 y.o. female with a history A-fib, hypothyroidism,bradycardia, non-obstructive coronary artery disease, DM, hypertension, cerebro-vascular disease and COPD/ashma. Pt admitted for acute on chronic CHF.    PT Comments    Patient seen for mobility progression. Patient received on [facility] and assisted with hygiene and pericare, patient then able to mobilize min guard/ supervision during transfer and ambulation in hall. Making steady progress towards PT goals. Will continue to see as indicated.  Follow Up Recommendations  Home health PT;Supervision/Assistance - 24 hour     Equipment Recommendations  None recommended by PT    Recommendations for Other Services       Precautions / Restrictions Precautions Precautions: Fall;Other (comment) Precaution Comments: watch O2 and HR Restrictions Weight Bearing Restrictions: No    Mobility  Bed Mobility Overal bed mobility: Needs Assistance Bed Mobility: Sit to Supine     Supine to sit: Supervision     General bed mobility comments: no physical assist requried  Transfers Overall transfer level: Needs assistance Equipment used: Rolling walker (2 wheeled) Transfers: Sit to/from [employer] Sit to Stand: Min guard Stand pivot transfers: Min guard       General transfer comment: Min guard for safety, able to pivot from [facility] to bed without assist, then able to power up with RW prior to ambulation  Ambulation/Gait Ambulation/Gait assistance: Min guard Ambulation Distance (Feet): 120 Feet Assistive device: Rolling walker (2 wheeled) Gait Pattern/deviations: Step-through pattern;Decreased stride length;Trunk flexed Gait velocity: decreased Gait velocity interpretation: Below normal speed for age/gender General Gait Details: some modest instability, no  physical assist require. one standing rest break. VCs for upright posture and positioning with RW   Stairs            Wheelchair Mobility    Modified Rankin (Stroke Patients Only)       Balance                                    Cognition Arousal/Alertness: Awake/alert Behavior During Therapy: WFL for tasks assessed/performed Overall Cognitive Status: Within Functional Limits for tasks assessed                      Exercises      General Comments        Pertinent Vitals/Pain Pain Assessment: No/denies pain    Home Living                      Prior Function            PT Goals (current goals can now be found in the care plan section) Acute Rehab PT Goals Patient Stated Goal: home PT Goal Formulation: With patient/family Time For Goal Achievement: 03/24/16 Potential to Achieve Goals: Good Progress towards PT goals: Progressing toward goals    Frequency    Min 3X/week      PT Plan Current plan remains appropriate    Co-evaluation             End of Session Equipment Utilized During Treatment: Gait belt;Oxygen Activity Tolerance: Patient tolerated treatment well Patient left: in bed;with call bell/phone within reach     Time: 1546-1606 PT Time Calculation (min) (ACUTE ONLY): 20 min  Charges:  $Gait Training: 8-22 mins  G Codes:      Fabio Asa 03/13/2016, 5:41 PM Charlotte Crumb, PT DPT  [phone]

## 2016-03-13 NOTE — Progress Notes (Addendum)
CM talked to patient's daughter [name], she is in agreement for palliative care referral to [facility]; referral placed as requested. Clinical information faxed to [phone]  03/14/2016 10:46 Received call from Dr Clifton James [facility] Chief Medical Director of [facility]; CM called patient's PCP as requested concerning palliative care services outpatient. MD is not in the office today; VM left discussing the disposition plans, call back number given ; Aneta Mins [phone]

## 2016-03-13 NOTE — Progress Notes (Signed)
Speech Language Pathology Treatment: Dysphagia  Patient Details Name: Shannon [name] MRN: [id number] DOB: 07/22/1923 Today's Date: 03/13/2016 Time: 7616-0737 SLP Time Calculation (min) (ACUTE ONLY): 16 min  Assessment / Plan / Recommendation Clinical Impression  F/u diet tolerance assessment complete. Patient alert and cooperative, able to recall 3/4 swallowing precautions taught after initial evaluation 12/10. Min verbal cueing provided for use of intermittent throat clear to clear potential penetrates noted during bedside evaluation (throat clearing) as well as during most recent MBS. Overall patient appears to be tolerating diet however patient with known aspiration risk based on history and development of potential aspiration PNA raises concern for either dysphagia or G-tube related aspiration. Discussed again possibility of instrumental testing with patient who understandably declines at this time. Will continue to f/u for education.    HPI HPI: Pt is a 82 y.o. female with PMH of anemia, asthma, CAD, CHF, COPD, hx of DVT on coumadin, HTN, HLP, SIADH, DM2, presenting to the ED on 11/30 for shortness of breath. Pt reported coughing a lot in the past few days with greenish sputum. Pt has had multiple recent hospitalizations and has been followed by speech therapy over previous admissions. Pt also with history of remote gastrojejunostomy & suspected vagotomy, status post GJ anastomosis resection and GJ revision after perforation of anastomotic ulcer 09/2013. Pt had a MBS on 11/09/15 indicating penetration of thin liquids to level of VFs which pt was able to sense and clear most of material. Also noted prominence at CP segment. Recommendation at that time was nectar thick liquids/ full liquid diet although there were pharyngeal residuals on this consistency. Pt was receiving additional nutrition through J tube. Pt seen again 01/26/16 with recommendation of full thin liquid diet. CXR on 12/8 showed left LL PNA  with associated effusion. Bedside swallow eval ordered to re-assess given extensive history.      SLP Plan  Continue with current plan of care     Recommendations  Diet recommendations: Thin liquid (full liquids) Liquids provided via: Cup;Straw Medication Administration: Whole meds with liquid Supervision: Patient able to self feed;Full supervision/cueing for compensatory strategies Compensations: Slow rate;Small sips/bites;Clear throat intermittently Postural Changes and/or Swallow Maneuvers: Seated upright 90 degrees;Upright 30-60 min after meal                Oral Care Recommendations: Oral care BID Follow up Recommendations: None Plan: Continue with current plan of care       [place], [place] [phone]    Kyran Whittier Meryl 03/13/2016, 3:16 PM

## 2016-03-13 NOTE — Progress Notes (Signed)
Referring Physician(s): Dr Liane Comber  Supervising Physician: Corrie Mckusick  Patient Status:  [facility] - In-pt  Chief Complaint:  Open GJ tube placed 09/2015 in OR with Dr Kauer Robert  Subjective:  IR has exchanged and repositioned tube few times since then. 11/7 /17 GJ was exchanged secondary coiling in stomach.  Tubing is functioning well Flushing well No leakage Flush to skin  We have been asked to "check" tubing prior to DC   Allergies: Penicillins  Medications: Prior to Admission medications   Medication Sig Start Date End Date Taking? Authorizing Provider  acetaminophen (TYLENOL) 325 MG tablet Take 650 mg by mouth 2 (two) times daily as needed for mild pain.    Yes Historical Provider, MD  ADVAIR DISKUS 250-50 MCG/DOSE AEPB INHALE 1 PUFF INTO THE LUNGS 2 (TWO) TIMES DAILY AS NEEDED FOR SHORTNESS OF BREATH 12/01/14  Yes Historical Provider, MD  AMINO ACIDS-PROTEIN HYDROLYS PO Take 30 mLs by mouth 2 (two) times daily.   Yes Historical Provider, MD  amiodarone (PACERONE) 200 MG tablet Take 200 mg by mouth daily.   Yes Historical Provider, MD  amLODipine (NORVASC) 10 MG tablet TAKE 1 TABLET BY MOUTH DAILY FOR HYPERTENSION 02/21/16  Yes Gildardo Cranker, DO  atorvastatin (LIPITOR) 10 MG tablet Take 10 mg by mouth daily.   Yes Historical Provider, MD  b complex vitamins tablet Take 1 tablet by mouth daily.    Yes Historical Provider, MD  bisacodyl (DULCOLAX) 10 MG suppository Place 1 suppository (10 mg total) rectally daily as needed for moderate constipation. 09/30/15  Yes Debbe Odea, MD  calcium-vitamin D (OSCAL WITH D) 500-200 MG-UNIT per tablet Take 1 tablet by mouth daily with breakfast.    Yes Historical Provider, MD  cefUROXime (CEFTIN) 250 MG tablet Take 250 mg by mouth 2 (two) times daily. 02/28/16  Yes Historical Provider, MD  cetirizine (ZYRTEC) 10 MG tablet Take 10 mg by mouth daily as needed for allergies or rhinitis.   Yes Historical Provider, MD  chlorhexidine (PERIDEX)  0.12 % solution Use as directed 15 mLs in the mouth or throat 2 (two) times daily.   Yes Historical Provider, MD  diltiazem (CARDIZEM CD) 120 MG 24 hr capsule Take 1 capsule (120 mg total) by mouth daily. 02/23/16 05/23/16 Yes Thompson Grayer, MD  feeding supplement (BOOST / RESOURCE BREEZE) LIQD Take 1 Container by mouth daily. 11/11/15  Yes Eugenie Filler, MD  fluticasone [facility]) 50 MCG/ACT nasal spray Place 2 sprays into both nostrils daily. 08/07/14  Yes Gildardo Cranker, DO  gabapentin (NEURONTIN) 300 MG capsule Take 1 capsule (300 mg total) by mouth 3 (three) times daily. 02/02/16  Yes Arbutus Leas, NP  hydrALAZINE (APRESOLINE) 50 MG tablet Take 1 tablet (50 mg total) by mouth 3 (three) times daily. 11/11/15  Yes Eugenie Filler, MD  HYDROcodone-acetaminophen (NORCO) 5-325 MG tablet Take 1 tablet by mouth every 6 (six) hours as needed for moderate pain. 02/18/16  Yes Monica Carter, DO  ipratropium-albuterol (DUONEB) 0.5-2.5 (3) MG/3ML SOLN Take 3 mLs by nebulization every 6 (six) hours as needed (sob). Use 3 times daily x 4 days then every 6 hours as needed. 11/11/15  Yes Eugenie Filler, MD  isosorbide mononitrate (IMDUR) 30 MG 24 hr tablet TAKE 1 TABLET (30 MG TOTAL) BY MOUTH DAILY. 06/22/15  Yes Historical Provider, MD  latanoprost (XALATAN) 0.005 % ophthalmic solution Place 1 drop into both eyes at bedtime. 06/02/14  Yes Blanchie Serve, MD  levalbuterol [facility] HFA) 45  MCG/ACT inhaler Inhale 2 puffs into the lungs every 6 (six) hours as needed for wheezing or shortness of breath. 02/03/16  Yes Gildardo Cranker, DO  levothyroxine (SYNTHROID, LEVOTHROID) 137 MCG tablet Take 1 tablet (137 mcg total) by mouth daily before breakfast. 12/27/15  Yes Gildardo Cranker, DO  Maltodextrin-Xanthan Gum (RESOURCE THICKENUP CLEAR) POWD Take 120 g by mouth as needed (use to thicken liquids.). 11/11/15  Yes Eugenie Filler, MD  Multiple Vitamin (MULTIVITAMIN WITH MINERALS) TABS tablet Take 1 tablet by mouth daily.   Yes  Historical Provider, MD  Nutritional Supplements (FEEDING SUPPLEMENT, OSMOLITE 1.5 CAL,) LIQD Place 1,000 mLs into feeding tube daily. 09/30/15  Yes Debbe Odea, MD  Polyethyl Glycol-Propyl Glycol 0.4-0.3 % SOLN Place 1 drop into both eyes 4 (four) times daily.    Yes Historical Provider, MD  polyethylene glycol powder (GLYCOLAX/MIRALAX) powder MIX 17GRAMS IN 8 OUNCES OF LIQUID AND DRINK TWICE DAILY AS NEEDED 09/13/15  Yes Monica Carter, DO  RESTASIS 0.05 % ophthalmic emulsion USE 1 DROP INTO BOTH EYES TWICE DAILY 11/01/15  Yes Estill Dooms, MD  sertraline (ZOLOFT) 100 MG tablet Take one tablet by mouth once daily for depression 09/03/15  Yes Gildardo Cranker, DO  sodium chloride 1 g tablet Take 1 tablet (1 g total) by mouth 2 (two) times daily with a meal. 02/28/16  Yes Orpah Greek, MD  traMADol (ULTRAM) 50 MG tablet Take 50 mg by mouth daily as needed for moderate pain.    Yes Historical Provider, MD  warfarin (COUMADIN) 2.5 MG tablet Take as directed by [facility]. Patient taking differently: Take 3.75 mg by mouth daily at 6 PM. Take as directed by [facility]. 02/16/16  Yes Dorothy Spark, MD  Water For Irrigation, Sterile (FREE WATER) SOLN Place 230 mLs into feeding tube 3 (three) times daily.   Yes Historical Provider, MD  pantoprazole (PROTONIX) 40 MG tablet TAKE 1 TABLET BY MOUTH TWICE A DAY 03/09/16   Gildardo Cranker, DO     Vital Signs: BP (!) 117/57 (BP Location: Right Arm)   Pulse 62   Temp 98.3 F (36.8 C) (Oral)   Resp 18   Ht 5\' 6"  (1.676 m)   Wt 135 lb 8 oz (61.5 kg)   SpO2 100%   BMI 21.87 kg/m   Physical Exam  Abdominal: Soft. Bowel sounds are normal.  Musculoskeletal: Normal range of motion.  Neurological: She is alert.  Skin: Skin is warm and dry.  Site of G tube is clean and dry 1 4x4 is beneath bumper at skin site. No leaking at site flushed well  No sign of infection   Nursing note and vitals reviewed.   Imaging: Dg Chest Port 1  View  Result Date: 03/10/2016 CLINICAL DATA:  Cough for several days with left-sided chest pain, initial encounter EXAM: PORTABLE CHEST 1 VIEW COMPARISON:  03/06/2016 FINDINGS: Cardiac shadow is mildly enlarged but stable. Increasing left lower lobe consolidation is noted with associated effusion. No other focal infiltrate is noted. No bony abnormality is seen. IMPRESSION: Left lower lobe pneumonia with associated effusion. Electronically Signed   By: Inez Catalina M.D.   On: 03/10/2016 14:48    Labs:  CBC:  Recent Labs  03/09/16 1037 03/10/16 0318 03/12/16 0741 03/13/16 0514  WBC 9.8 9.0 8.5 10.0  HGB 8.2* 7.8* 9.1* 9.4*  HCT 24.0* 22.8* 27.3* 27.7*  PLT 407* 405* 561* 577*    COAGS:  Recent Labs  01/27/16 1235 01/27/16 2001 01/28/16  6415  02/08/16 0340  03/10/16 0318 03/11/16 0354 03/12/16 0152 03/13/16 0514  INR 1.20  --  1.20  < > 4.42*  < > 2.20 2.30 2.22 2.15  APTT 66* 88* 74*  --  52*  --   --   --   --   --   < > = values in this interval not displayed.  BMP:  Recent Labs  03/08/16 1027 03/09/16 1037 03/11/16 0354 03/13/16 0514  NA 127* 125* 130* 131*  K 3.8 4.2 4.1 4.5  CL 87* 87* 88* 92*  CO2 28 28 28 22   GLUCOSE 244* 223* 183* 181*  BUN 47* 46* 50* 64*  CALCIUM 8.7* 8.4* 9.1 9.1  CREATININE 1.33* 1.26* 1.34* 1.74*  GFRNONAA 34* 36* 34* 25*  GFRAA 39* 42* 39* 29*    LIVER FUNCTION TESTS:  Recent Labs  11/06/15 1612 01/24/16 1633 02/07/16 2118 03/02/16 1210  BILITOT 0.5 0.3 0.5 0.5  AST 26 39 24 30  ALT 23 26 41 30  ALKPHOS 49 68 59 61  PROT 7.6 8.1 7.5 6.4*  ALBUMIN 3.6 3.8 3.8 3.4*    Assessment and Plan:  Discussed tubing and function with daughter via phone. Her bigger question is --"is tubing coiled in stomach?" With flushing and functioning wnl---coiling is highly unlikely. She is asking too evaluate tubing for coiling before pt is sent home. Discussed with Dr Earleen Newport; will get KUB for evaluation   Electronically  Signed: Vianca Bracher A 03/13/2016, 2:46 PM   I spent a total of 15 Minutes at the the patient's bedside AND on the patient's hospital floor or unit, greater than 50% of which was counseling/coordinating care for [place] tube

## 2016-03-14 ENCOUNTER — Telehealth: Payer: Self-pay | Admitting: Cardiology

## 2016-03-14 ENCOUNTER — Telehealth: Payer: Self-pay | Admitting: *Deleted

## 2016-03-14 DIAGNOSIS — Z7189 Other specified counseling: Secondary | ICD-10-CM

## 2016-03-14 DIAGNOSIS — R1319 Other dysphagia: Secondary | ICD-10-CM

## 2016-03-14 DIAGNOSIS — I509 Heart failure, unspecified: Secondary | ICD-10-CM

## 2016-03-14 LAB — CREATININE, SERUM
Creatinine, Ser: 1.93 mg/dL — ABNORMAL HIGH (ref 0.44–1.00)
GFR calc Af Amer: 25 mL/min — ABNORMAL LOW (ref >60)
GFR calc non Af Amer: 22 mL/min — ABNORMAL LOW (ref >60)

## 2016-03-14 LAB — GLUCOSE, CAPILLARY
Glucose-Capillary: 121 mg/dL — ABNORMAL HIGH (ref 65–99)
Glucose-Capillary: 153 mg/dL — ABNORMAL HIGH (ref 65–99)
Glucose-Capillary: 156 mg/dL — ABNORMAL HIGH (ref 65–99)
Glucose-Capillary: 157 mg/dL — ABNORMAL HIGH (ref 65–99)

## 2016-03-14 LAB — PROTIME-INR
INR: 2.21
Prothrombin Time: 24.9 seconds — ABNORMAL HIGH (ref 11.4–15.2)

## 2016-03-14 MED ORDER — AMOXICILLIN-POT CLAVULANATE 875-125 MG PO TABS: 1.0000 | ORAL_TABLET | Freq: Two times a day (BID) | ORAL | 0 refills | Status: AC

## 2016-03-14 MED ORDER — FUROSEMIDE 40 MG PO TABS: 40.0000 mg | ORAL_TABLET | Freq: Two times a day (BID) | ORAL | 12 refills | Status: DC

## 2016-03-14 MED ORDER — FUROSEMIDE 40 MG PO TABS: 40.0000 mg | ORAL_TABLET | Freq: Every day | ORAL | 12 refills | Status: DC

## 2016-03-14 MED ORDER — PIPERACILLIN-TAZOBACTAM IN DEX 2-0.25 GM/50ML IV SOLN
2.2500 g | Freq: Four times a day (QID) | INTRAVENOUS | Status: DC
  Filled 2016-03-14 (×2): qty 50

## 2016-03-14 MED ORDER — PIPERACILLIN-TAZOBACTAM IN DEX 2-0.25 GM/50ML IV SOLN
2.2500 g | Freq: Four times a day (QID) | INTRAVENOUS | Status: DC
  Filled 2016-03-14 (×4): qty 50

## 2016-03-14 NOTE — Telephone Encounter (Signed)
Shannon [name], Nurse Case Manager at [facility] called and just wanted to let you know that patient is in the hospital and will be discharged with Home Health and will be followed by [facility].

## 2016-03-14 NOTE — Progress Notes (Signed)
ANTICOAGULATION CONSULT NOTE - Follow Up Consult  Pharmacy Consult for warfarin Indication: atrial fibrillation  Allergies  Allergen Reactions  . Penicillins Itching    Has patient had a PCN reaction causing immediate rash, facial/tongue/throat swelling, SOB or lightheadedness with hypotension: Unknown Has patient had a PCN reaction causing severe rash involving mucus membranes or skin necrosis: Unknown Has patient had a PCN reaction that required hospitalization: Unknown Has patient had a PCN reaction occurring within the last 10 years: Unknown If all of the above answers are "NO", then may proceed with Cephalosporin use.     Patient Measurements: Height: 5\' 6"  (167.6 cm) Weight: 133 lb 9.6 oz (60.6 kg) (scale c) IBW/kg (Calculated) : 59.3  Vital Signs: Temp: 98.4 F (36.9 C) (12/12 0550) Temp Source: Oral (12/12 0550) BP: 127/48 (12/12 0903) Pulse Rate: 75 (12/12 0550)  Labs:  Recent Labs  03/12/16 0152 03/12/16 0741 03/13/16 0514 03/14/16 0340  HGB  --  9.1* 9.4*  --   HCT  --  27.3* 27.7*  --   PLT  --  561* 577*  --   LABPROT 25.0*  --  24.4* 24.9*  INR 2.22  --  2.15 2.21  CREATININE  --   --  1.74* 1.93*    Estimated Creatinine Clearance: 18.1 mL/min (by C-G formula based on SCr of 1.93 mg/dL (H)).    Assessment: 22 yoF continues on warfarin for hx afib and recent DVT. INR 1.71 on admit.  Continues to be therapeutic at 2.21 today. CBC stable, no bleeding noted.  On amiodarone and levothyroxine continued from PTA.  Goal of Therapy:  INR 2-3 Monitor platelets by anticoagulation protocol: Yes   Plan:  Give warfarin 4 mg po daily. Monitor daily INR, CBC, clinical course, s/sx of bleed, PO intake, DDI   Thank you for allowing [place] to participate in this patients care. Uvaldo Bristle, PharmD Pager: [phone] 03/14/2016,11:02 AM

## 2016-03-14 NOTE — Progress Notes (Signed)
All d/c instructions explained and given to pt and her daughter at the bedside.  Verbalized understanding.  Karie Kirks, RN

## 2016-03-14 NOTE — Progress Notes (Signed)
Patient ID: Shannon [name], female   DOB: 1924-02-09, 82 y.o.   MRN: [id number]  Pt and dtr worried about location and position of GJ  Requesting to be sure all is well with tube.  Functioning well; no leaks  IMPRESSION: GJ tube noted as above described with gastric component in the body of the stomach and tip of jejunal component in the left lower quadrant in the expected course of proximal jejunum.  In proper position.

## 2016-03-14 NOTE — Progress Notes (Signed)
SATURATION QUALIFICATIONS: (This note is used to comply with regulatory documentation for home oxygen)  Patient Saturations on Room Air at Rest = 98%  Patient Saturations on Room Air while Ambulating = 94%  Patient Saturations on 2 Liters of oxygen while Ambulating = 98%  Please briefly explain why patient needs home oxygen: Patient not on home O2 prior to admission and is tolerating activity well on room air, do not feel patient will required further supplemental O2 at this time unless respiratory status declines. Alben Deeds, PT DPT [phone]

## 2016-03-14 NOTE — Progress Notes (Signed)
Physical Therapy Treatment Patient Details Name: Shannon [name] MRN: [id number] DOB: [birth year] Today's Date: March 18, 2016    History of Present Illness Shannon [name] is a 82 y.o. female with a history A-fib, hypothyroidism,bradycardia, non-obstructive coronary artery disease, DM, hypertension, cerebro-vascular disease and COPD/ashma. Pt admitted for acute on chronic CHF.    PT Comments    Patient mobilizing well, ambulated increased distance with RW on room air. One brief standing rest break. VSS throughout session. Patient is eager to go home.  Follow Up Recommendations  Home health PT;Supervision/Assistance - 24 hour     Equipment Recommendations  None recommended by PT    Recommendations for Other Services       Precautions / Restrictions Precautions Precautions: Fall;Other (comment) Precaution Comments: watch O2 and HR Restrictions Weight Bearing Restrictions: No    Mobility  Bed Mobility               General bed mobility comments: received in chair  Transfers Overall transfer level: Needs assistance Equipment used: Rolling walker (2 wheeled) Transfers: Sit to/from Stand Sit to Stand: Min guard         General transfer comment: min guard for stability when coming to upright, no physical assist required  Ambulation/Gait Ambulation/Gait assistance: Supervision Ambulation Distance (Feet): 240 Feet Assistive device: Rolling walker (2 wheeled) Gait Pattern/deviations: Step-through pattern;Decreased stride length;Trunk flexed Gait velocity: decreased   General Gait Details: ambulated on room air with stable saturations   Stairs            Wheelchair Mobility    Modified Rankin (Stroke Patients Only)       Balance                                    Cognition Arousal/Alertness: Awake/alert Behavior During Therapy: WFL for tasks assessed/performed Overall Cognitive Status: Within Functional Limits for tasks assessed                      Exercises      General Comments        Pertinent Vitals/Pain Pain Assessment: No/denies pain    Home Living                      Prior Function            PT Goals (current goals can now be found in the care plan section) Acute Rehab PT Goals Patient Stated Goal: home PT Goal Formulation: With patient/family Time For Goal Achievement: 03/24/16 Potential to Achieve Goals: Good Progress towards PT goals: Progressing toward goals    Frequency    Min 3X/week      PT Plan Current plan remains appropriate    Co-evaluation             End of Session Equipment Utilized During Treatment: Gait belt Activity Tolerance: Patient tolerated treatment well Patient left: in chair;with call bell/phone within reach     Time: 1135-1157 PT Time Calculation (min) (ACUTE ONLY): 22 min  Charges:  $Gait Training: 8-22 mins                    G Codes:      Duncan Dull [name], 2016, 4:07 PM Alben Deeds, [place] DPT  [phone]

## 2016-03-14 NOTE — Progress Notes (Addendum)
Patient Name: Shannon [name] Date of Encounter: 03/14/2016  Primary Cardiologist: Dr. Rhona Raider Problem List     Principal Problem:   Acute on chronic diastolic CHF (congestive heart failure) (HCC) Active Problems:   Chronic kidney insufficiency, stage 3 (moderate)   Diabetes (HCC)   Gastric outlet obstruction   Dysphagia   Atrial flutter (HCC)   Warfarin anticoagulation   PEG (percutaneous endoscopic gastrostomy) status (HCC)   Pressure injury of skin   CHF (NYHA class IV, ACC/AHA stage D) ([place])   Advance care planning   Palliative care by specialist   Shortness of breath     Subjective   Feels well this morning, denies SOB. Cough better. Walking in hallway.  Inpatient Medications    Scheduled Meds: . amiodarone  200 mg Oral Daily  . atorvastatin  10 mg Oral q1800  . benzonatate  200 mg Oral TID  . cycloSPORINE  1 drop Both Eyes BID  . diltiazem  120 mg Oral Daily  . feeding supplement (GLUCERNA SHAKE)  237 mL Oral Q24H  . feeding supplement (PRO-STAT SUGAR FREE 64)  30 mL Oral BID BM  . fluticasone  2 spray Each Nare Daily  . free water  200 mL Per Tube Q8H  . furosemide  40 mg Intravenous Q12H  . gabapentin  300 mg Oral TID  . hydrALAZINE  50 mg Oral TID  . insulin aspart  0-9 Units Subcutaneous Q4H  . isosorbide mononitrate  30 mg Oral Daily  . latanoprost  1 drop Both Eyes QHS  . levothyroxine  137 mcg Oral QAC breakfast  . loratadine  10 mg Oral Daily  . mometasone-formoterol  2 puff Inhalation BID  . pantoprazole  40 mg Oral BID  . piperacillin-tazobactam (ZOSYN)  IV  3.375 g Intravenous Q8H  . polyvinyl alcohol  1 drop Both Eyes QID  . senna  1 tablet Oral BID  . sertraline  100 mg Oral Daily  . sodium chloride flush  3 mL Intravenous Q12H  . warfarin  4 mg Oral q1800  . Warfarin - Pharmacist Dosing Inpatient   Does not apply q1800   Continuous Infusions: . feeding supplement (GLUCERNA 1.2 CAL) 1,000 mL (03/12/16 2214)   PRN Meds: sodium  chloride, acetaminophen, albuterol, guaiFENesin-dextromethorphan, ipratropium-albuterol, morphine CONCENTRATE, ondansetron (ZOFRAN) IV, simethicone, sodium chloride flush, traMADol   Vital Signs    Vitals:   03/13/16 1238 03/13/16 2046 03/14/16 0550 03/14/16 0807  BP: (!) 117/57 (!) 128/57 (!) 114/57   Pulse: 62  75   Resp: 18 16 16    Temp:  98.1 F (36.7 C) 98.4 F (36.9 C)   TempSrc:  Oral Oral   SpO2: 100% 100% 99% 100%  Weight:   133 lb 9.6 oz (60.6 kg)   Height:        Intake/Output Summary (Last 24 hours) at 03/14/16 0837 Last data filed at 03/14/16 0553  Gross per 24 hour  Intake             1945 ml  Output             1950 ml  Net               -5 ml   Filed Weights   03/12/16 0551 03/13/16 0606 03/14/16 0550  Weight: 136 lb 1.6 oz (61.7 kg) 135 lb 8 oz (61.5 kg) 133 lb 9.6 oz (60.6 kg)    Physical Exam   GEN: Elderly female in no acute  distress.  HEENT: Grossly normal.  Neck: Supple, no JVD, carotid bruits, or masses. Cardiac: IRRR, no murmurs, rubs, or gallops. No clubbing, cyanosis, edema.  Radials/DP/PT 2+ and equal bilaterally.  Respiratory:  Respirations regular and labored, Diffuse rhonchi  GI: Soft, nontender, nondistended, BS + x 4. MS: no deformity or atrophy. Skin: warm and dry, no rash. Neuro:  Strength and sensation are intact. Psych: AAOx3.  Normal affect.  Labs    CBC  Recent Labs  03/12/16 0741 03/13/16 0514  WBC 8.5 10.0  HGB 9.1* 9.4*  HCT 27.3* 27.7*  MCV 84.5 84.2  PLT 561* 161*   Basic Metabolic Panel  Recent Labs  03/13/16 0514 03/14/16 0340  NA 131*  --   K 4.5  --   CL 92*  --   CO2 22  --   GLUCOSE 181*  --   BUN 64*  --   CREATININE 1.74* 1.93*  CALCIUM 9.1  --       Telemetry    3:1 Atrial flutter - Personally Reviewed    Radiology    Dg Abdomen Peg Tube Location  Result Date: 03/13/2016 CLINICAL DATA:  Evaluate gastrojejunostomy tube for proper positioning EXAM: ABDOMEN - 1 VIEW COMPARISON:   02/08/2016, 01/03/2016 FINDINGS: Contrast is noted within the proximal stomach with GJ tube balloon noted in the body of the stomach. The jejunostomy component is seen coursing along the expected course the duodenum and into proximal jejunum, tip in the left lower quadrant. Chain sutures are also seen in the left lower quadrant. Cholecystectomy clips are seen in the right upper quadrant. Chronic T11 superior endplate compression. IMPRESSION: GJ tube noted as above described with gastric component in the body of the stomach and tip of jejunal component in the left lower quadrant in the expected course of proximal jejunum. Electronically Signed   By: Ashley Royalty M.D.   On: 03/13/2016 17:45    Cardiac Studies   Echocardiogram 01/27/16: - Left ventricle: The cavity size was normal. Wall thickness was normal. Systolic function was normal. The estimated ejection fraction was in the range of 55% to 60%. Wall motion was normal; there were no regional wall motion abnormalities. Doppler parameters are consistent with abnormal left ventricular relaxation (grade 1 diastolic dysfunction). - Aortic valve: There was mild regurgitation. - Mitral valve: There was mild regurgitation. - Left atrium: The atrium was moderately dilated.  Impressions:  - Normal LV systolic function; grade 1 diastolic dysfunction; mild AI; mild MR; moderate LAE.  Nuclear stress test 02/25/14: - No ischemia.   Patient Profile  Shannon [name] is a 82 year old female with a past medical history of persistent Afib/flutter on Amiodarone, CKD stage III, DM, HTN, chronic diastolic CHF, CVA, gastric outlet obstruction s/p J tube. Multiple recent hospitalizations most recent in Oct. 2017 for rapid aflutter and acute on chronic diastolic CHF.   Presented to the office on 03/02/16 with 8 pound weight gain in 2 days, BLE edema and orthopnea. Admitted for IV diuresis, has chronic hyponatremia, palliative care involved for goals of  care.    Assessment & Plan  1. Acute on chronic diastolic heart failure: Negative 5 L. Says her breathing has improved. Still not able to lie flat. Getting IV diuresis, I suspect some of her labored breathing is related to her aspiration PNA.   I would put her on po Lasix 40mg  BID - start tomorrow PM,   continue SL Morphine for increased work of breathing.   Weight down 21  pounds from admission. The family would like to take her home and receive Hospice care in the home, Palliative Care has been consulted.   2. Chronic anticoagulation/Atrial Flutter: Rate controlled, in 3:1 Flutter. Patient is on Coumadin, INR is therapeutic. Will continue same.   3. Aspiration pneumonia - On IV Zosyn, would switch to Augmentin 500mg /125mg  po q 12 hours, discussed PCN allergy with Pharmacy who felt that it was safe to due Augmentin. (patient had rash with Amox).  - Left lower lobe pneumonia noted on x-ray 03/10/16.  4. Gastric outlet obstruction - J-tube. IR has checked tube, and talked with family.   5. History of stroke - Coumadin  6. Hyponatremia: Improved  7. Chronic anemia of chronic disease   Signed, Arbutus Leas, NP  03/14/2016, 8:37 AM   I have seen, examined and evaluated the patient this AM  along with Jettie Booze, NP.  After reviewing all the available data and chart, we discussed the patients laboratory, study & physical findings as well as symptoms in detail. I agree with her findings, examination as well as impression recommendations as per our discussion.    Much better today. The only concern is that her renal function has showed signs of potential overdiuresis. I have stopped her Lasix today and would not be started until tomorrow. She will need a creatinine check BMP at the end of the week. Her BUN is elevated, this would suggest that she is indeed a little bit dehydrated.  Continue antibiotic to complete a ten-day course using Augmentin Sodium levels  stabilized.  With likely aspiration pneumonia triggering this event, I think it's best for her to go back to thickened liquids as part of her dysphagia diet.  Otherwise she is stable for discharge today, I'm scared to keep her longer any longer she will get sick again. Holding off on her diuretics should help her renal function improve.    Glenetta Hew, M.D., M.S. Interventional Cardiologist   Pager # [phone] Phone # [phone] [address] [place], [place] [address]

## 2016-03-14 NOTE — Progress Notes (Signed)
Daily Progress Note   Patient Name: Shannon [name]       Date: 03/14/2016 DOB: [birth year]  Age: 82 y.o. MRN#: [id number] Attending Physician: Lars Masson, MD Primary Care Physician: Kirt Boys, DO Admit Date: 03/02/2016  Reason for Consultation/Follow-up: Establishing goals of care  Subjective: Patient sitting up in chair, eating lunch. She is happy that she is going home today. She recalled our meeting on Sunday and was appreciative. She is looking forward to visit from palliative care at home. Note that referral has been made to [facility] for palliative service.  She is comfortable and had no complaints. I called her daughter [name] per patient's request and left a message with my contact information.  Review of Systems  All other systems reviewed and are negative.   Length of Stay: 12  Current Medications: Scheduled Meds:  . amiodarone  200 mg Oral Daily  . atorvastatin  10 mg Oral q1800  . benzonatate  200 mg Oral TID  . cycloSPORINE  1 drop Both Eyes BID  . diltiazem  120 mg Oral Daily  . feeding supplement (GLUCERNA SHAKE)  237 mL Oral Q24H  . feeding supplement (PRO-STAT SUGAR FREE 64)  30 mL Oral BID BM  . fluticasone  2 spray Each Nare Daily  . free water  200 mL Per Tube Q8H  . gabapentin  300 mg Oral TID  . hydrALAZINE  50 mg Oral TID  . insulin aspart  0-9 Units Subcutaneous Q4H  . isosorbide mononitrate  30 mg Oral Daily  . latanoprost  1 drop Both Eyes QHS  . levothyroxine  137 mcg Oral QAC breakfast  . loratadine  10 mg Oral Daily  . mometasone-formoterol  2 puff Inhalation BID  . pantoprazole  40 mg Oral BID  . piperacillin-tazobactam (ZOSYN)  IV  2.25 g Intravenous Q6H  . polyvinyl alcohol  1 drop Both Eyes QID  . senna  1 tablet Oral BID  . sertraline  100  mg Oral Daily  . sodium chloride flush  3 mL Intravenous Q12H  . warfarin  4 mg Oral q1800  . Warfarin - Pharmacist Dosing Inpatient   Does not apply q1800    Continuous Infusions: . feeding supplement (GLUCERNA 1.2 CAL) 1,000 mL (03/12/16 2214)    PRN Meds: sodium chloride, acetaminophen, albuterol, guaiFENesin-dextromethorphan,  ipratropium-albuterol, morphine CONCENTRATE, ondansetron (ZOFRAN) IV, simethicone, sodium chloride flush, traMADol  Physical Exam  Constitutional: She is oriented to person, place, and time. No distress.  Thin, frail  HENT:  Head: Normocephalic and atraumatic.  Cardiovascular: Normal rate and intact distal pulses.   Pulmonary/Chest: Effort normal and breath sounds normal. No respiratory distress.  Musculoskeletal: Normal range of motion.  Neurological: She is alert and oriented to person, place, and time.  Skin: Skin is warm and dry.  Psychiatric: She has a normal mood and affect. Her behavior is normal. Judgment and thought content normal.  Nursing note and vitals reviewed.           Vital Signs: BP (!) 127/48   Pulse 75   Temp 98.4 F (36.9 C) (Oral)   Resp 16   Ht 5\' 6"  (1.676 m)   Wt 60.6 kg (133 lb 9.6 oz) Comment: scale c  SpO2 100%   BMI 21.56 kg/m  SpO2: SpO2: 100 % O2 Device: O2 Device: Nasal Cannula O2 Flow Rate: O2 Flow Rate (L/min): 2 L/min  Intake/output summary:  Intake/Output Summary (Last 24 hours) at 03/14/16 1352 Last data filed at 03/14/16 0932  Gross per 24 hour  Intake             2185 ml  Output             1950 ml  Net              235 ml   LBM: Last BM Date: 03/14/16 Baseline Weight: Weight: 71 kg (156 lb 9.6 oz) Most recent weight: Weight: 60.6 kg (133 lb 9.6 oz) (scale c)       Palliative Assessment/Data: PPS: 40%    Flowsheet Rows   Flowsheet Row Most Recent Value  Intake Tab  Referral Department  Cardiology  Unit at Time of Referral  Cardiac/Telemetry Unit  Palliative Care Primary Diagnosis  Cardiac    Date Notified  03/11/16  Palliative Care Type  Return patient Palliative Care  Reason for referral  Clarify Goals of Care  Date of Admission  03/02/16  Date first seen by Palliative Care  03/12/16  # of days Palliative referral response time  1 Day(s)  # of days IP prior to Palliative referral  9  Clinical Assessment  Psychosocial & Spiritual Assessment  Palliative Care Outcomes      Patient Active Problem List   Diagnosis Date Noted  . CHF (NYHA class IV, ACC/AHA stage D) (HCC)   . Advance care planning   . Palliative care by specialist   . Shortness of breath   . Pressure injury of skin 03/11/2016  . PEG (percutaneous endoscopic gastrostomy) status (HCC) 03/10/2016  . Acute on chronic diastolic CHF (congestive heart failure) (HCC) 03/02/2016  . Encounter for therapeutic drug monitoring 02/16/2016  . Sepsis (HCC) 02/08/2016  . UTI (urinary tract infection) 02/08/2016  . Warfarin anticoagulation   . Chronic seasonal allergic rhinitis   . Sinus pain   . Acute renal insufficiency 01/26/2016  . Acute deep vein thrombosis (DVT) of left lower extremity (HCC)   . Acute respiratory failure (HCC)   . Respiratory alkalosis   . Atrial flutter (HCC) 01/24/2016  . Chronic diastolic CHF (congestive heart failure) (HCC) 01/24/2016  . Infection 01/24/2016  . Bilateral leg edema 12/29/2015  . Hypothyroidism due to medication   . Weakness   . Pyloric stenosis   . Pressure ulcer 09/15/2015  . Hyperkalemia 09/12/2015  . Acute osteomyelitis of toe  of right foot (HCC)   . PVD (peripheral vascular disease) (HCC)   . Adjustment disorder with anxiety 08/29/2015  . Diabetic ulcer of toe of right foot associated with type 2 diabetes mellitus, with necrosis of muscle (HCC)   . Diverticulitis of large intestine without perforation or abscess without bleeding   . Colitis 08/26/2015  . Acute on chronic diastolic CHF (congestive heart failure), NYHA class 4 (HCC) 08/02/2015  . Esophageal  candidiasis (HCC)   . Goals of care, counseling/discussion   . Dyspnea   . SOB (shortness of breath)   . Acute renal failure superimposed on stage 3 chronic kidney disease (HCC)   . Encounter for long-term (current) use of other high-risk medications   . Respiratory distress 07/06/2015  . Dysphagia 07/05/2015  . Asthma with allergic rhinitis 06/04/2015  . Hypertensive heart disease 03/18/2015  . Malnutrition of moderate degree 01/28/2015  . Syncope 01/27/2015  . Fever, unspecified 01/22/2015  . Bradycardia 01/21/2015  . Hypotension 01/21/2015  . Palliative care encounter   . DNR (do not resuscitate) discussion   . Protein-calorie malnutrition, severe 01/12/2015  . Elevated troponin 01/12/2015  . HTN (hypertension), uncontrolled 01/12/2015  . AP (abdominal pain) 01/10/2015  . SBO (small bowel obstruction) 01/10/2015  . Cerebrovascular disease 07/14/2014  . Diabetic polyneuropathy associated with type 2 diabetes mellitus (HCC) 07/14/2014  . Hyperlipidemia 07/14/2014  . Gastric outlet obstruction 05/21/2014  . Unstable gait 04/29/2014  . Hyponatremia 03/23/2014  . Acute encephalopathy 03/22/2014  . Slurred speech   . Gastroparesis   . Slow transit constipation 02/03/2014  . Carotid stenosis 02/03/2014  . Demand ischemia (HCC) 01/21/2014  . Gastric bezoar 01/20/2014  . Anemia of chronic disease 01/18/2014  . Chronic kidney insufficiency, stage 3 (moderate) 01/18/2014  . Depression 09/29/2013  . Essential hypertension, benign 09/22/2013  . GERD (gastroesophageal reflux disease) 09/22/2013  . Dyslipidemia 09/22/2013  . DM (diabetes mellitus) type II uncontrolled, periph vascular disorder (HCC) 09/22/2013  . Persistent atrial fibrillation (HCC) 09/22/2013  . CVA (cerebral vascular accident) (HCC) 09/22/2013  . Diabetic neuropathy (HCC) 09/22/2013  . Hypothyroidism 09/22/2013  . Chronic gastrojejunal ulcer with perforation (HCC) 09/07/2013  . Diabetes (HCC) 09/07/2013  .  Accelerated hypertension 09/07/2013  . Systemic lupus erythematosus (SLE) inhibitor 09/07/2013  . Other specified abnormal immunological findings in serum 09/07/2013    Palliative Care Assessment & Plan   Patient Profile: 82 y.o. female  with past medical history of diabetes, stroke, HTN, DVT, COPD, CKD, CHF, asthma admitted on 03/02/2016 with CHF exacerbation. During admission she has developed aspiration pneumonia. Palliative medicine consulted for GOC.   Assessment/Recommendations/Plan   D/C home with community palliative followup  Code Status:  Limited code - no CPR, no intubation  Prognosis:   Unable to determine  Discharge Planning:  Home with Palliative Services  Care plan was discussed with patient.  Thank you for allowing the Palliative Medicine Team to assist in the care of this patient.   Time In: 1315 Time Out: 1340 Total Time 25 Prolonged Time Billed No      Greater than 50%  of this time was spent counseling and coordinating care related to the above assessment and plan.  Ocie Bob, AGNP-C Palliative Medicine   Please contact Palliative Medicine Team phone at [phone] for questions and concerns.

## 2016-03-14 NOTE — Discharge Summary (Signed)
Discharge Summary    Patient ID: [facility],  MRN: [id number], DOB/AGE: 07/30/1923 82 y.o.  Admit date: 03/02/2016 Discharge date: 03/14/2016  Primary Care Provider: Kirt Boys Primary Cardiologist: Dr. Delton See   Discharge Diagnoses    Principal Problem:   Acute on chronic diastolic CHF (congestive heart failure) (HCC) Active Problems:   Chronic kidney insufficiency, stage 3 (moderate)   Diabetes (HCC)   Gastric outlet obstruction   Dysphagia   Atrial flutter (HCC)   Warfarin anticoagulation   PEG (percutaneous endoscopic gastrostomy) status (HCC)   Pressure injury of skin   CHF (NYHA class IV, ACC/AHA stage D) (HCC)   Advance care planning   Palliative care by specialist   Shortness of breath   Allergies Allergies  Allergen Reactions  . Penicillins Itching    Has patient had a PCN reaction causing immediate rash, facial/tongue/throat swelling, SOB or lightheadedness with hypotension: Unknown Has patient had a PCN reaction causing severe rash involving mucus membranes or skin necrosis: Unknown Has patient had a PCN reaction that required hospitalization: Unknown Has patient had a PCN reaction occurring within the last 10 years: Unknown If all of the above answers are "NO", then may proceed with Cephalosporin use.     Diagnostic Studies/Procedures  None  _____________   History of Present Illness    Shannon Smithis a 82 y.o.femalewho presented to the office on 11/301/17 for an urgent visit with  8 pounds weight gain in 2 days, shortness of breath, orthopnea and worsening lower extremity edema. This has started a few days ago when she noticed worsening shortness of breath now at rest, also paroxysmal nocturnal dyspnea as well as worsening lower extremity edema. She has noticed 5 pound weight increase overnight from 147-152 pounds. The patient states that she has been coughing a lot in the last few days with sputum of greenish color, she denies any fever or  chills. She denies palpitations or syncope. Denies chest pain.  This is a very pleasant elderly female with history of persistent atrial fibrillation on amiodarone, bradycardia, CKD stage III,hypothyroidism,non-obstructive coronary artery disease, type 2 diabetes, hypertension, chronic diastolic CHF, stroke, cerebrovascular disease, COPD, asthma, gastric outlet obstruction s/p prior J tube, anemia.  She has had multiple recent hospitalizations, she was admitted in August with bradycardia and weakness with HRs in the 30s-40s. Her labetololwas discontinued and amiodarone was reduced from 200 mg daily to 100 mg daily. She was seen in the office 01/24/16 extremely SOB with rapid atrial flutter and hypoxia. She was admitted to [facility] and upon arrival she required Bipap and IV Lasix with rapid improvement with brisk UOP. She converted to NSR on amiodarone drip. She was also noted to have a LLE DVT. Her Eliquis was held and she was started on IV heparin. Hematology saw her and recommended Xarelto but with her GFR <30, Coumadin was started. Her amiodarone was increased to 200mg  BID x 2 weeks with plans to decrease to 200mg  daily as OP if remaining in NSR. IR also saw her for J tube obstruction. 2D Echo 01/27/16 showed EF 55-60%, grade 1 DD, mild AI/MR, mod LAE.   Unfortunately she was re-admitted 11/6-11/11 with worsening SOB and chest pain with fever, leukocytosis, tachycardia, AKI on CKD, and was treated for sepsis due to UTI. Her Coumadin was held due to supratherapeutic range. EKG on 02/07/16 showed atrial flutter. Last labs during that adm showed Hgb 9.5, Na 133, BUN 36, Cr 1.17, INR 3.27.  She was admitted  from the office on 03/02/16 for acute on chronic diastolic CHF.    Hospital Course   1. Acute on chronic diastolic heart failure: Negative 5 L. Says her breathing has improved. Still not able to lie flat.   I would put her on po Lasix 40mg  BID   continue SL Morphine for increased  work of breathing.   Weight down 21 pounds from admission. The family would like to take her home and receive Hospice care in the home, Palliative Care has been consulted.   2. Chronic anticoagulation/Atrial Flutter: Rate controlled, in 3:1 Flutter. Patient is on Coumadin, INR is therapeutic. Will continue same.   3. Aspiration pneumonia - On IV Zosyn, would switch to Augmentin 875mg /125mg  po q 12 hours, discussed PCN allergy with Pharmacy who felt that it was safe to due Augmentin. (patient had rash with Amox).  - Left lower lobe pneumonia noted on x-ray 03/10/16.  4. Gastric outlet obstruction - J-tube. IR has checked tube, and talked with family.   5. History of stroke - Coumadin  6. Hyponatremia: Improved  7. Chronic anemia of chronic disease   _____________  Discharge Vitals Blood pressure (!) 127/48, pulse 75, temperature 98.4 F (36.9 C), temperature source Oral, resp. rate 16, height 5\' 6"  (1.676 m), weight 133 lb 9.6 oz (60.6 kg), SpO2 100 %.  Filed Weights   03/12/16 0551 03/13/16 0606 03/14/16 0550  Weight: 136 lb 1.6 oz (61.7 kg) 135 lb 8 oz (61.5 kg) 133 lb 9.6 oz (60.6 kg)    Labs & Radiologic Studies     CBC  Recent Labs  03/12/16 0741 03/13/16 0514  WBC 8.5 10.0  HGB 9.1* 9.4*  HCT 27.3* 27.7*  MCV 84.5 84.2  PLT 561* 577*   Basic Metabolic Panel  Recent Labs  03/13/16 0514 03/14/16 0340  NA 131*  --   K 4.5  --   CL 92*  --   CO2 22  --   GLUCOSE 181*  --   BUN 64*  --   CREATININE 1.74* 1.93*  CALCIUM 9.1  --     Dg Chest 2 View  Result Date: 03/06/2016 CLINICAL DATA:  Shortness of breath with cough and chest congestion today. History of asthma -COPD, coronary artery disease, nonsmoker. EXAM: CHEST  2 VIEW COMPARISON:  Portable chest x-ray of March 05, 2016 FINDINGS: The lungs are reasonably well inflated. The cardiac silhouette remains enlarged. The central pulmonary vascularity is mildly prominent and slightly more  conspicuous than on yesterday's study. There is calcification in the wall of the aortic arch. The mediastinum is normal in width. There is no significant pleural effusion. The observed bony thorax is unremarkable. IMPRESSION: Chronic bronchitic changes. Probable superimposed low-grade CHF. No discrete pneumonia. Thoracic aortic atherosclerosis. Electronically Signed   By: David  [place] M.D.   On: 03/06/2016 10:28   Dg Chest 2 View  Result Date: 03/02/2016 CLINICAL DATA:  Shortness of Breath EXAM: CHEST  2 VIEW COMPARISON:  02/07/2016 FINDINGS: Cardiomediastinal silhouette is stable. There is chronic elevation of the right hemidiaphragm. There is streaky left base retrocardiac atelectasis or infiltrate best seen on lateral view. Mild compression deformity lower thoracic spine of indeterminate age. Clinical correlation is necessary. IMPRESSION: There is streaky left base retrocardiac atelectasis or infiltrate best seen on lateral view. Mild compression deformity lower thoracic spine of indeterminate age. Clinical correlation is necessary. Electronically Signed   By: Natasha Mead M.D.   On: 03/02/2016 14:00   Ct Head Wo Contrast  Result Date: 02/28/2016 CLINICAL DATA:  Jaw pain.  Pain and swelling. EXAM: CT HEAD WITHOUT CONTRAST CT NECK WITHOUT CONTRAST TECHNIQUE: Contiguous axial images were obtained from the base of the skull through the vertex without contrast. Multidetector CT imaging of the neck was performed using the standard protocol without intravenous contrast. COMPARISON:  Head CT 11/06/2015, sinus CT 01/27/2016 FINDINGS: CT HEAD FINDINGS Brain: No acute intracranial hemorrhage. No focal mass lesion. No CT evidence of acute infarction. No midline shift or mass effect. No hydrocephalus. Basilar cisterns are patent. There are periventricular and subcortical white matter hypodensities. Generalized cortical atrophy. Vascular: No hyperdense vessel or unexpected calcification. Skull: Normal. Negative for  fracture or focal lesion. Sinuses/Orbits: No acute finding. Other: None. CT NECK FINDINGS Pharynx and larynx: Normal. No mass or swelling. Salivary glands: No inflammation, mass, or stone. Thyroid: Normal. Lymph nodes: None enlarged or abnormal density. Vascular: Negative. Mastoids and visualized paranasal sinuses: Clear. Skeleton: Motion degradation limits evaluation of the available. Degenerate change in the cervical spine Set hypertrophy, osteophytosis and joint space narrowing. Degenerative cystic change in the dense Upper chest: Clear Other: Motion degradation IMPRESSION: 1. No acute intracranial findings. 2. Atrophy and white matter microvascular disease. 3. No acute findings in the neck. 4. Motion degradation limits evaluation of the mandible. No abscess or inflammation identified. Electronically Signed   By: Genevive Bi M.D.   On: 02/28/2016 08:04   Ct Soft Tissue Neck Wo Contrast  Result Date: 02/28/2016 CLINICAL DATA:  Jaw pain.  Pain and swelling. EXAM: CT HEAD WITHOUT CONTRAST CT NECK WITHOUT CONTRAST TECHNIQUE: Contiguous axial images were obtained from the base of the skull through the vertex without contrast. Multidetector CT imaging of the neck was performed using the standard protocol without intravenous contrast. COMPARISON:  Head CT 11/06/2015, sinus CT 01/27/2016 FINDINGS: CT HEAD FINDINGS Brain: No acute intracranial hemorrhage. No focal mass lesion. No CT evidence of acute infarction. No midline shift or mass effect. No hydrocephalus. Basilar cisterns are patent. There are periventricular and subcortical white matter hypodensities. Generalized cortical atrophy. Vascular: No hyperdense vessel or unexpected calcification. Skull: Normal. Negative for fracture or focal lesion. Sinuses/Orbits: No acute finding. Other: None. CT NECK FINDINGS Pharynx and larynx: Normal. No mass or swelling. Salivary glands: No inflammation, mass, or stone. Thyroid: Normal. Lymph nodes: None enlarged or  abnormal density. Vascular: Negative. Mastoids and visualized paranasal sinuses: Clear. Skeleton: Motion degradation limits evaluation of the available. Degenerate change in the cervical spine Set hypertrophy, osteophytosis and joint space narrowing. Degenerative cystic change in the dense Upper chest: Clear Other: Motion degradation IMPRESSION: 1. No acute intracranial findings. 2. Atrophy and white matter microvascular disease. 3. No acute findings in the neck. 4. Motion degradation limits evaluation of the mandible. No abscess or inflammation identified. Electronically Signed   By: Genevive Bi M.D.   On: 02/28/2016 08:04   Dg Abdomen Peg Tube Location  Result Date: 03/13/2016 CLINICAL DATA:  Evaluate gastrojejunostomy tube for proper positioning EXAM: ABDOMEN - 1 VIEW COMPARISON:  02/08/2016, 01/03/2016 FINDINGS: Contrast is noted within the proximal stomach with GJ tube balloon noted in the body of the stomach. The jejunostomy component is seen coursing along the expected course the duodenum and into proximal jejunum, tip in the left lower quadrant. Chain sutures are also seen in the left lower quadrant. Cholecystectomy clips are seen in the right upper quadrant. Chronic T11 superior endplate compression. IMPRESSION: GJ tube noted as above described with gastric component in the body of the stomach and  tip of jejunal component in the left lower quadrant in the expected course of proximal jejunum. Electronically Signed   By: Tollie Eth M.D.   On: 03/13/2016 17:45   Dg Chest Port 1 View  Result Date: 03/10/2016 CLINICAL DATA:  Cough for several days with left-sided chest pain, initial encounter EXAM: PORTABLE CHEST 1 VIEW COMPARISON:  03/06/2016 FINDINGS: Cardiac shadow is mildly enlarged but stable. Increasing left lower lobe consolidation is noted with associated effusion. No other focal infiltrate is noted. No bony abnormality is seen. IMPRESSION: Left lower lobe pneumonia with associated  effusion. Electronically Signed   By: Alcide Clever M.D.   On: 03/10/2016 14:48   Dg Chest Port 1v Same Day  Result Date: 03/05/2016 CLINICAL DATA:  CHF EXAM: PORTABLE CHEST 1 VIEW COMPARISON:  03/02/2016 FINDINGS: Stable cardiomegaly without definite CHF. Chronic elevation of the right hemidiaphragm. Increased consolidative left lower lobe opacity obscures the left cardiac border and the left hemidiaphragm suspicious for developing left lower lobe collapse/ consolidation. Pneumonia not excluded. No pneumothorax. Trachea is midline. Atherosclerosis changes of the aorta. Degenerative changes of the spine and shoulders. IMPRESSION: Stable cardiomegaly without definite edema or CHF Increased left lower lobe collapse/consolidation concerning for developing pneumonia Thoracic aortic atherosclerosis Electronically Signed   By: Judie Petit.  Shick M.D.   On: 03/05/2016 14:13    Disposition   Pt is being discharged home today in good condition.  Follow-up Plans & Appointments    Follow-up Information    [facility] Follow up.   Why:  They will do your home health care at your home Contact information: [address] [place] [address] [phone]          Discharge Instructions    Diet - low sodium heart healthy    Complete by:  As directed    Increase activity slowly    Complete by:  As directed       Discharge Medications   Current Discharge Medication List    START taking these medications   Details  amoxicillin-clavulanate (AUGMENTIN) 875-125 MG tablet Take 1 tablet by mouth 2 (two) times daily. Qty: 14 tablet, Refills: 0    furosemide (LASIX) 40 MG tablet Take 1 tablet (40 mg total) by mouth daily. Qty: 30 tablet, Refills: 12      CONTINUE these medications which have NOT CHANGED   Details  acetaminophen (TYLENOL) 325 MG tablet Take 650 mg by mouth 2 (two) times daily as needed for mild pain.     ADVAIR DISKUS 250-50 MCG/DOSE AEPB INHALE 1 PUFF INTO  THE LUNGS 2 (TWO) TIMES DAILY AS NEEDED FOR SHORTNESS OF BREATH Refills: 3    AMINO ACIDS-PROTEIN HYDROLYS PO Take 30 mLs by mouth 2 (two) times daily.    amiodarone (PACERONE) 200 MG tablet Take 200 mg by mouth daily.    atorvastatin (LIPITOR) 10 MG tablet Take 10 mg by mouth daily.   Associated Diagnoses: Bradycardia    b complex vitamins tablet Take 1 tablet by mouth daily.     bisacodyl (DULCOLAX) 10 MG suppository Place 1 suppository (10 mg total) rectally daily as needed for moderate constipation. Qty: 12 suppository, Refills: 0    calcium-vitamin D (OSCAL WITH D) 500-200 MG-UNIT per tablet Take 1 tablet by mouth daily with breakfast.     cetirizine (ZYRTEC) 10 MG tablet Take 10 mg by mouth daily as needed for allergies or rhinitis.    chlorhexidine (PERIDEX) 0.12 % solution Use as directed 15 mLs  in the mouth or throat 2 (two) times daily.    diltiazem (CARDIZEM CD) 120 MG 24 hr capsule Take 1 capsule (120 mg total) by mouth daily. Qty: 90 capsule, Refills: 3    !! feeding supplement (BOOST / RESOURCE BREEZE) LIQD Take 1 Container by mouth daily.    fluticasone (FLONASE) 50 MCG/ACT nasal spray Place 2 sprays into both nostrils daily. Qty: 16 g, Refills: 3    gabapentin (NEURONTIN) 300 MG capsule Take 1 capsule (300 mg total) by mouth 3 (three) times daily. Qty: 90 capsule, Refills: 12    hydrALAZINE (APRESOLINE) 50 MG tablet Take 1 tablet (50 mg total) by mouth 3 (three) times daily. Qty: 120 tablet, Refills: 0    HYDROcodone-acetaminophen (NORCO) 5-325 MG tablet Take 1 tablet by mouth every 6 (six) hours as needed for moderate pain. Qty: 30 tablet, Refills: 0    ipratropium-albuterol (DUONEB) 0.5-2.5 (3) MG/3ML SOLN Take 3 mLs by nebulization every 6 (six) hours as needed (sob). Use 3 times daily x 4 days then every 6 hours as needed. Qty: 360 mL, Refills: 4    isosorbide mononitrate (IMDUR) 30 MG 24 hr tablet TAKE 1 TABLET (30 MG TOTAL) BY MOUTH DAILY. Refills: 2     latanoprost (XALATAN) 0.005 % ophthalmic solution Place 1 drop into both eyes at bedtime. Qty: 2.5 mL, Refills: 12    levalbuterol (XOPENEX HFA) 45 MCG/ACT inhaler Inhale 2 puffs into the lungs every 6 (six) hours as needed for wheezing or shortness of breath. Qty: 1 Inhaler, Refills: 12    levothyroxine (SYNTHROID, LEVOTHROID) 137 MCG tablet Take 1 tablet (137 mcg total) by mouth daily before breakfast. Qty: 30 tablet, Refills: 2    Maltodextrin-Xanthan Gum (RESOURCE THICKENUP CLEAR) POWD Take 120 g by mouth as needed (use to thicken liquids.). Qty: 20 Can, Refills: 0    Multiple Vitamin (MULTIVITAMIN WITH MINERALS) TABS tablet Take 1 tablet by mouth daily.    !! Nutritional Supplements (FEEDING SUPPLEMENT, OSMOLITE 1.5 CAL,) LIQD Place 1,000 mLs into feeding tube daily. Qty: 1000 mL, Refills: 3    Polyethyl Glycol-Propyl Glycol 0.4-0.3 % SOLN Place 1 drop into both eyes 4 (four) times daily.     polyethylene glycol powder (GLYCOLAX/MIRALAX) powder MIX 17GRAMS IN 8 OUNCES OF LIQUID AND DRINK TWICE DAILY AS NEEDED Qty: 3162 g, Refills: 1    RESTASIS 0.05 % ophthalmic emulsion USE 1 DROP INTO BOTH EYES TWICE DAILY Qty: 60 mL, Refills: 0    sertraline (ZOLOFT) 100 MG tablet Take one tablet by mouth once daily for depression Qty: 90 tablet, Refills: 1    traMADol (ULTRAM) 50 MG tablet Take 50 mg by mouth daily as needed for moderate pain.     warfarin (COUMADIN) 2.5 MG tablet Take as directed by Coumadin Clinic. Qty: 30 tablet, Refills: 1    Water For Irrigation, Sterile (FREE WATER) SOLN Place 230 mLs into feeding tube 3 (three) times daily.    pantoprazole (PROTONIX) 40 MG tablet TAKE 1 TABLET BY MOUTH TWICE A DAY Qty: 60 tablet, Refills: 5     !! - Potential duplicate medications found. Please discuss with provider.    STOP taking these medications     amLODipine (NORVASC) 10 MG tablet      cefUROXime (CEFTIN) 250 MG tablet      sodium chloride 1 g tablet       simethicone (MYLICON) 80 MG chewable tablet             Outstanding Labs/Studies  BMP   Duration of Discharge Encounter   Greater than 30 minutes including physician time.  Signed, Little Ishikawa NP 03/14/2016, 1:41 PM    I saw, examined and evaluated the patient this AM  along with Suzzette Righter, NP.  After reviewing all the available data and chart, we discussed the patients laboratory, study & physical findings as well as symptoms in detail. I agree with her findings, examination as well as impression recommendations as per our discussion.    Much better today. The only concern is that her renal function has showed signs of potential overdiuresis. I have stopped her Lasix today and would not be started until tomorrow. She will need a creatinine check BMP at the end of the week. Her BUN is elevated, this would suggest that she is indeed a little bit dehydrated.  Continue antibiotic to complete a ten-day course using Augmentin Sodium levels stabilized.  With likely aspiration pneumonia triggering this event, I think it's best for her to go back to thickened liquids as part of her dysphagia diet.  Otherwise she is stable for discharge today, I'm scared to keep her longer any longer she will get sick again. Holding off on her diuretics should help her renal function improve.  Should be ok for her to have dental work done - would need to hold anticoagulation.    Bryan Lemma, M.D., M.S. Interventional Cardiologist   Pager # [phone] Phone # [phone] [address] [place], [place] [address]\   .

## 2016-03-14 NOTE — Telephone Encounter (Signed)
10 day TCM fu appt per Erin--appt 03-21-16 at 11a with Nell Range

## 2016-03-14 NOTE — Progress Notes (Signed)
Pharmacy Antibiotic Note  Shannon [name] is a 82 y.o. female admitted on 03/02/2016 with aspiration pneumia.  Pharmacy has been consulted for zosyn dosing.  Patient afebrile, wbc 10. SCr has bumped up significantly following diuresis, current CrCl ~ 18, t. Weight down ~ 20 lbs since admission.   Plan: Decrease Zosyn dosing to 2.25 gm every 6 hours over 30 minutes  F/u daily SCr and adjust dosing as needed  Monitor clinical progress, renal function, abx plan/LOT   Height: 5\' 6"  (167.6 cm) Weight: 133 lb 9.6 oz (60.6 kg) (scale c) IBW/kg (Calculated) : 59.3  Temp (24hrs), Avg:98.3 F (36.8 C), Min:98.1 F (36.7 C), Max:98.4 F (36.9 C)   Recent Labs Lab 03/08/16 1027 03/09/16 1037 03/10/16 0318 03/11/16 0354 03/12/16 0741 03/13/16 0514 03/14/16 0340  WBC  --  9.8 9.0  --  8.5 10.0  --   CREATININE 1.33* 1.26*  --  1.34*  --  1.74* 1.93*    Estimated Creatinine Clearance: 18.1 mL/min (by C-G formula based on SCr of 1.93 mg/dL (H)).    Allergies  Allergen Reactions  . Penicillins Itching    Has patient had a PCN reaction causing immediate rash, facial/tongue/throat swelling, SOB or lightheadedness with hypotension: Unknown Has patient had a PCN reaction causing severe rash involving mucus membranes or skin necrosis: Unknown Has patient had a PCN reaction that required hospitalization: Unknown Has patient had a PCN reaction occurring within the last 10 years: Unknown If all of the above answers are "NO", then may proceed with Cephalosporin use.    Thank you for allowing [place] to participate in this patients care. Uvaldo Bristle, PharmD Pharmacy Resident  Pager: [phone] 03/14/2016 11:12 AM

## 2016-03-15 ENCOUNTER — Telehealth: Payer: Self-pay | Admitting: Cardiology

## 2016-03-15 ENCOUNTER — Telehealth: Payer: Self-pay

## 2016-03-15 ENCOUNTER — Ambulatory Visit (INDEPENDENT_AMBULATORY_CARE_PROVIDER_SITE_OTHER): Payer: Medicare Other | Admitting: Cardiovascular Disease

## 2016-03-15 ENCOUNTER — Ambulatory Visit: Payer: Medicare Other | Admitting: Cardiology

## 2016-03-15 DIAGNOSIS — E1122 Type 2 diabetes mellitus with diabetic chronic kidney disease: Secondary | ICD-10-CM | POA: Diagnosis not present

## 2016-03-15 DIAGNOSIS — L89610 Pressure ulcer of right heel, unstageable: Secondary | ICD-10-CM | POA: Diagnosis not present

## 2016-03-15 DIAGNOSIS — Z931 Gastrostomy status: Secondary | ICD-10-CM | POA: Diagnosis not present

## 2016-03-15 DIAGNOSIS — I130 Hypertensive heart and chronic kidney disease with heart failure and stage 1 through stage 4 chronic kidney disease, or unspecified chronic kidney disease: Secondary | ICD-10-CM | POA: Diagnosis not present

## 2016-03-15 DIAGNOSIS — D638 Anemia in other chronic diseases classified elsewhere: Secondary | ICD-10-CM | POA: Diagnosis not present

## 2016-03-15 DIAGNOSIS — Z5181 Encounter for therapeutic drug level monitoring: Secondary | ICD-10-CM

## 2016-03-15 DIAGNOSIS — Z7951 Long term (current) use of inhaled steroids: Secondary | ICD-10-CM | POA: Diagnosis not present

## 2016-03-15 DIAGNOSIS — I82442 Acute embolism and thrombosis of left tibial vein: Secondary | ICD-10-CM

## 2016-03-15 DIAGNOSIS — R1310 Dysphagia, unspecified: Secondary | ICD-10-CM | POA: Diagnosis not present

## 2016-03-15 DIAGNOSIS — I5033 Acute on chronic diastolic (congestive) heart failure: Secondary | ICD-10-CM | POA: Diagnosis not present

## 2016-03-15 DIAGNOSIS — Z7901 Long term (current) use of anticoagulants: Secondary | ICD-10-CM | POA: Diagnosis not present

## 2016-03-15 DIAGNOSIS — I4892 Unspecified atrial flutter: Secondary | ICD-10-CM

## 2016-03-15 DIAGNOSIS — N183 Chronic kidney disease, stage 3 (moderate): Secondary | ICD-10-CM | POA: Diagnosis not present

## 2016-03-15 DIAGNOSIS — L89620 Pressure ulcer of left heel, unstageable: Secondary | ICD-10-CM | POA: Diagnosis not present

## 2016-03-15 DIAGNOSIS — L89151 Pressure ulcer of sacral region, stage 1: Secondary | ICD-10-CM | POA: Diagnosis not present

## 2016-03-15 LAB — POCT INR: INR: 2

## 2016-03-15 NOTE — Telephone Encounter (Signed)
Transition Care Management Follow-Up Telephone Call   Date discharged and where:  How have you been since you were released from the hospital? Spoke with pt's daughter, states pt still has a cough and diarrhea. Daughter has given her Tussin. Eating ok, only drinking liquids at this time (broth soups, Gaterade etc.) Pallative Care to come out on Mon, 03/20/16.  Any patient concerns? Daughter wants to know what to give pt for gas, as they have stopped her Mylicon in the hospital. Also, she was taking Tessalon Pearls in the hospital for her cough, but she was not given a prescription. What can the pt's daughter give her to help with her coughing? Please Advise.  Items Reviewed:   Meds: Y  Allergies: Y  Dietary Changes Reviewed: Y; on a low sodium diet, currently pt is only tolerating liquids.   Functional Questionnaire:  Independent-I Dependent-D  ADLs:   Dressing- I     Eating- I   Maintaining continence- I   Transferring- I   Transportation- I   Meal Prep- D   Managing Meds- D  Confirmed importance and Date/Time of follow-up visits scheduled: 03/24/16 with  Dr. Eulas Post   Confirmed with patient if condition worsens to call PCP or go to the Emergency Dept. Patient was given office number and encouraged to call back with questions or concerns: Yes

## 2016-03-15 NOTE — Telephone Encounter (Signed)
Patient contacted regarding discharge from [facility] on 03/14/16.  Patient understands to follow up with provider Nell Range, PA-C on 03/21/16 at 11:00 am at [address].  Patient understands discharge instructions? Yes  Patient understands medications and regiment? Yes  Patient understands to bring all medications to this visit? Yes  I spoke with the patient's daughter, Soledad Gerlach, (DPR on file). The daughter understood that the patient should START taking the amoxicillin and lasix. She understood for the patient to STOP taking the amlodipine, cefuroxime, sodium chloride, and mylicon. She understood to CONTINUE all of the other medications as directed which did not change.   The daughter had a question about how the patient should take her warfarin. In the discharge summary it states to take as directed by the Coumadin Clinic. I gave her the number to the Coumadin clinic so that she could follow up with them directly. The daughter verbalized understanding and was appreciative for the call.

## 2016-03-15 NOTE — Telephone Encounter (Signed)
Home health RN with [facility] spoke to Dr Meda Coffee to request that she sign a faxed order for the pt to have PT/OT/Skilled Nursing.  Butch Penny RN with [facility] states that Dr Meda Coffee will only have to sign to face-to-face encounter paper work, and the pts PCP will follow her PT/OT/Skilled Nursing orders from here on out.  Butch Penny RN states Dr Meda Coffee has to sign this, so they can get reimbursed for this through Medicare.  Butch Penny RN will fax this to our office today.

## 2016-03-15 NOTE — Telephone Encounter (Signed)
Shannon [name] ( Uncertain ) is calling to see if a FACE to [facility] can be placed into Epic for [facility] for Nursing, PT and Social Work . Please call if you have any questions !  Thanks

## 2016-03-16 DIAGNOSIS — N183 Chronic kidney disease, stage 3 (moderate): Secondary | ICD-10-CM | POA: Diagnosis not present

## 2016-03-16 DIAGNOSIS — L89151 Pressure ulcer of sacral region, stage 1: Secondary | ICD-10-CM | POA: Diagnosis not present

## 2016-03-16 DIAGNOSIS — I5033 Acute on chronic diastolic (congestive) heart failure: Secondary | ICD-10-CM | POA: Diagnosis not present

## 2016-03-16 DIAGNOSIS — E1122 Type 2 diabetes mellitus with diabetic chronic kidney disease: Secondary | ICD-10-CM | POA: Diagnosis not present

## 2016-03-16 DIAGNOSIS — I130 Hypertensive heart and chronic kidney disease with heart failure and stage 1 through stage 4 chronic kidney disease, or unspecified chronic kidney disease: Secondary | ICD-10-CM | POA: Diagnosis not present

## 2016-03-16 DIAGNOSIS — L89610 Pressure ulcer of right heel, unstageable: Secondary | ICD-10-CM | POA: Diagnosis not present

## 2016-03-16 NOTE — Progress Notes (Signed)
Cardiology Office Note    Date:  03/21/2016   ID:  Shannon [name], DOB 30-Mar-1924, MRN [id number]  PCP:  Kirt Boys, DO  Cardiologist:  Dr. Delton See  CC: post hosptial follow up -CHF  History of Present Illness:  Shannon [name] is a 82 y.o. female with a history of persistent atrial fibrillation on amiodarone, bradycardia, CKD stage III,hypothyroidism,non-obstructive coronary artery disease, type 2 diabetes, hypertension, chronic diastolic CHF, stroke, cerebrovascular disease, COPD, asthma, gastric outlet obstruction s/p prior J tube, anemia and hyponatremia who presents to clinic for follow up of CHF.  She has had multiple recent hospitalizations, she was admitted in August with bradycardia and weakness with HRs in the 30s-40s. Her labetololwas discontinued and amiodarone was reduced from 200 mg daily to 100 mg daily. She was seen in the office 01/24/16 extremely SOB with rapid atrial flutter and hypoxia. She was admitted to [facility] and upon arrival she required Bipap and IV Lasix with rapid improvement with brisk UOP. She converted to NSR on amiodarone drip. She was also noted to have a LLE DVT. Her Eliquis was held and she was started on IV heparin. Hematology saw her and recommended Xarelto but with her GFR <30, Coumadin was started. Her amiodarone was increased to 200mg  BID x 2 weeks with plans to decrease to 200mg  daily as OP if remaining in NSR. IR also saw her for J tube obstruction. 2D Echo 01/27/16 showed EF 55-60%, grade 1 DD, mild AI/MR, mod LAE.   Unfortunately she was re-admitted 11/6-11/11 with worsening SOB and chest pain with fever, leukocytosis, tachycardia, AKI on CKD, and was treated for sepsis due to UTI. Her Coumadin was held due to supratherapeutic range. EKG on 02/07/16 showed atrial flutter. Last labs during that adm showed Hgb 9.5, Na 133, BUN 36, Cr 1.17, INR 3.27.  She was admitted from the office on 03/02/16 for acute on chronic diastolic CHF. Diuresed 5  L and 21 lbs. Discharge weight 133. Treated for aspiration PNA, told to go back on thickened liquid diet. Discharged on Lasix 40mg  daily. Palliative care was consulted. She was sent home with palliative care services. Limited code.   Today she presents to clinic for follow up. She has been feeling better since leaving the hospital. Feels like she is getting stronger each day. Breathing okay. No chest pain. Some mild LE edema, sleeps on two pillow. No PND. No dizziness or syncope. Has been using a nebulizer solution for some SOB. Doing pretty well otherwise.   Past Medical History:  Diagnosis Date  . Anemia    previous blood transfusions  . Arthritis    "all over"  . Asthma   . Bradycardia    requiring previous d/c of BB and reduction of amiodarone  . CAD (coronary artery disease)    nonobstructive per notes  . Chronic diastolic CHF (congestive heart failure) (HCC)   . CKD (chronic kidney disease), stage III   . Complication of blood transfusion    "got the wrong blood type at [facility] in ~ 2015; no adverse reaction that we are aware of"/daughter, Shannon [name] (01/27/2016)  . COPD (chronic obstructive pulmonary disease) (HCC)   . Depression    "light case"  . DVT (deep venous thrombosis) (HCC) 01/2016   a. LLE DVT 01/2016 - switched from Eliquis to Coumadin.  . Gastric stenosis    a. s/p stomach tube  . GERD (gastroesophageal reflux disease)   . History of blood transfusion    "several" (01/27/2016)  .  History of stomach ulcers   . Hyperlipidemia   . Hypertension   . Hypothyroidism   . Paraesophageal hernia   . Perforated gastric ulcer (HCC)   . Seasonal allergies   . SIADH (syndrome of inappropriate ADH production) (HCC)    [name] 01/10/2015  . Small bowel obstruction    "I don't know how many" (01/11/2015)  . Stroke (HCC)    "light one"  . Type II diabetes mellitus (HCC)    "related to prednisone use  for > 20 yr; once predinose stopped; no more DM RX" (01/27/2016)  . Ventral  hernia with bowel obstruction     Past Surgical History:  Procedure Laterality Date  . CATARACT EXTRACTION W/ INTRAOCULAR LENS  IMPLANT, BILATERAL    . CHOLECYSTECTOMY OPEN    . COLECTOMY    . ESOPHAGOGASTRODUODENOSCOPY N/A 01/19/2014   Procedure: ESOPHAGOGASTRODUODENOSCOPY (EGD);  Surgeon: Hilarie Fredrickson, MD;  Location: [facility];  Service: Endoscopy;  Laterality: N/A;  . ESOPHAGOGASTRODUODENOSCOPY N/A 01/20/2014   Procedure: ESOPHAGOGASTRODUODENOSCOPY (EGD);  Surgeon: Hilarie Fredrickson, MD;  Location: [facility];  Service: Endoscopy;  Laterality: N/A;  . ESOPHAGOGASTRODUODENOSCOPY N/A 03/19/2014   Procedure: ESOPHAGOGASTRODUODENOSCOPY (EGD);  Surgeon: Rachael Fee, MD;  Location: [facility];  Service: Endoscopy;  Laterality: N/A;  . ESOPHAGOGASTRODUODENOSCOPY N/A 07/08/2015   Procedure: ESOPHAGOGASTRODUODENOSCOPY (EGD);  Surgeon: Sherrilyn Rist, MD;  Location: [facility]COPY;  Service: Endoscopy;  Laterality: N/A;  . ESOPHAGOGASTRODUODENOSCOPY (EGD) WITH PROPOFOL N/A 09/15/2015   Procedure: ESOPHAGOGASTRODUODENOSCOPY (EGD) WITH PROPOFOL;  Surgeon: Ruffin Frederick, MD;  Location: [facility];  Service: Gastroenterology;  Laterality: N/A;  . GASTROJEJUNOSTOMY     hx/notes 01/10/2015  . GASTROJEJUNOSTOMY N/A 09/23/2015   Procedure: OPEN GASTROJEJUNOSTOMY TUBE PLACEMENT;  Surgeon: De Blanch Kinsinger, MD;  Location: [facility] ORS;  Service: General;  Laterality: N/A;  . GLAUCOMA SURGERY Bilateral   . HERNIA REPAIR  2015  . IR GENERIC HISTORICAL  01/07/2016   IR GJ TUBE CHANGE 01/07/2016 Malachy Moan, MD [facility]  . IR GENERIC HISTORICAL  01/27/2016   IR MECH REMOV OBSTRUC MAT ANY COLON TUBE W/FLUORO 01/27/2016 Richarda Overlie, MD [facility]  . IR GENERIC HISTORICAL  02/07/2016   IR PATIENT EVAL TECH 0-60 MINS Darrell K Allred, PA-C [facility]  . IR GENERIC HISTORICAL  02/08/2016   IR GJ TUBE CHANGE 02/08/2016 Berdine Dance, MD [facility]  . LAPAROTOMY N/A 01/20/2015    Procedure: EXPLORATORY LAPAROTOMY;  Surgeon: Abigail Miyamoto, MD;  Location: [facility] OR;  Service: General;  Laterality: N/A;  . LYSIS OF ADHESION N/A 01/20/2015   Procedure: LYSIS OF ADHESIONS < 1 HOUR;  Surgeon: Abigail Miyamoto, MD;  Location: [facility] OR;  Service: General;  Laterality: N/A;  . TONSILLECTOMY    . TUBAL LIGATION    . VENTRAL HERNIA REPAIR  2015   incarcerated ventral hernia ([facility] 09/2013)/notes 01/10/2015    Current Medications: Outpatient Medications Prior to Visit  Medication Sig Dispense Refill  . acetaminophen (TYLENOL) 325 MG tablet Take 650 mg by mouth 2 (two) times daily as needed for mild pain.     [name] ADVAIR DISKUS 250-50 MCG/DOSE AEPB INHALE 1 PUFF INTO THE LUNGS 2 (TWO) TIMES DAILY AS NEEDED FOR SHORTNESS OF BREATH  3  . AMINO ACIDS-PROTEIN HYDROLYS PO Take 30 mLs by mouth 2 (two) times daily.    [name] amiodarone (PACERONE) 200 MG tablet Take 200 mg by mouth daily.    [name] amoxicillin-clavulanate (AUGMENTIN) 875-125 MG tablet Take 1 tablet by mouth 2 (  two) times daily. 14 tablet 0  . atorvastatin (LIPITOR) 10 MG tablet Take 10 mg by mouth daily.    [name] b complex vitamins tablet Take 1 tablet by mouth daily.     . bisacodyl (DULCOLAX) 10 MG suppository Place 1 suppository (10 mg total) rectally daily as needed for moderate constipation. 12 suppository 0  . calcium-vitamin D (OSCAL WITH D) 500-200 MG-UNIT per tablet Take 1 tablet by mouth daily with breakfast.     . cetirizine (ZYRTEC) 10 MG tablet Take 10 mg by mouth daily as needed for allergies or rhinitis.    . chlorhexidine (PERIDEX) 0.12 % solution Use as directed 15 mLs in the mouth or throat 2 (two) times daily.    [name] diltiazem (CARDIZEM CD) 120 MG 24 hr capsule Take 1 capsule (120 mg total) by mouth daily. 90 capsule 3  . feeding supplement (BOOST / RESOURCE BREEZE) LIQD Take 1 Container by mouth daily.    . fluticasone (FLONASE) 50 MCG/ACT nasal spray Place 2 sprays into both nostrils daily. 16 g 3  . gabapentin (NEURONTIN)  300 MG capsule Take 1 capsule (300 mg total) by mouth 3 (three) times daily. 90 capsule 12  . hydrALAZINE (APRESOLINE) 50 MG tablet Take 1 tablet (50 mg total) by mouth 3 (three) times daily. 120 tablet 0  . HYDROcodone-acetaminophen (NORCO) 5-325 MG tablet Take 1 tablet by mouth every 6 (six) hours as needed for moderate pain. 30 tablet 0  . isosorbide mononitrate (IMDUR) 30 MG 24 hr tablet TAKE 1 TABLET (30 MG TOTAL) BY MOUTH DAILY.  2  . latanoprost (XALATAN) 0.005 % ophthalmic solution Place 1 drop into both eyes at bedtime. 2.5 mL 12  . levalbuterol (XOPENEX HFA) 45 MCG/ACT inhaler Inhale 2 puffs into the lungs every 6 (six) hours as needed for wheezing or shortness of breath. 1 Inhaler 12  . levothyroxine (SYNTHROID, LEVOTHROID) 137 MCG tablet Take 1 tablet (137 mcg total) by mouth daily before breakfast. 30 tablet 2  . Maltodextrin-Xanthan Gum (RESOURCE THICKENUP CLEAR) POWD Take 120 g by mouth as needed (use to thicken liquids.). 20 Can 0  . Multiple Vitamin (MULTIVITAMIN WITH MINERALS) TABS tablet Take 1 tablet by mouth daily.    . Nutritional Supplements (FEEDING SUPPLEMENT, OSMOLITE 1.5 CAL,) LIQD Place 1,000 mLs into feeding tube daily. 1000 mL 3  . pantoprazole (PROTONIX) 40 MG tablet TAKE 1 TABLET BY MOUTH TWICE A DAY 60 tablet 5  . Polyethyl Glycol-Propyl Glycol 0.4-0.3 % SOLN Place 1 drop into both eyes 4 (four) times daily.     . polyethylene glycol powder (GLYCOLAX/MIRALAX) powder MIX 17GRAMS IN 8 OUNCES OF LIQUID AND DRINK TWICE DAILY AS NEEDED 3162 g 1  . RESTASIS 0.05 % ophthalmic emulsion USE 1 DROP INTO BOTH EYES TWICE DAILY 60 mL 0  . sertraline (ZOLOFT) 100 MG tablet Take one tablet by mouth once daily for depression 90 tablet 1  . traMADol (ULTRAM) 50 MG tablet Take 50 mg by mouth daily as needed for moderate pain.     [name] warfarin (COUMADIN) 2.5 MG tablet Take as directed by Coumadin Clinic. (Patient taking differently: Take 3.75 mg by mouth daily at 6 PM. Take as directed by  Coumadin Clinic.) 30 tablet 1  . Water For Irrigation, Sterile (FREE WATER) SOLN Place 230 mLs into feeding tube 3 (three) times daily.    . furosemide (LASIX) 40 MG tablet Take 1 tablet (40 mg total) by mouth 2 (two) times daily. 30 tablet 12  .  ipratropium-albuterol (DUONEB) 0.5-2.5 (3) MG/3ML SOLN Take 3 mLs by nebulization every 6 (six) hours as needed (sob). Use 3 times daily x 4 days then every 6 hours as needed. 360 mL 4   No facility-administered medications prior to visit.      Allergies:   Penicillins   Social History   Social History  . Marital status: Widowed    Spouse name: N/A  . Number of children: 3  . Years of education: N/A   Social History Main Topics  . Smoking status: Never Smoker  . Smokeless tobacco: Former Neurosurgeon    Types: Snuff     Comment: "used snuff in my younger days"  . Alcohol use No  . Drug use: No  . Sexual activity: No   Other Topics Concern  . None   Social History Narrative   Married in [place], lives in one story house with 2 people and no pets   Occupation: ?   Has a living Will, [place], and doesn't have DNR   Was living with Husband (in frail health) in [place] until 09/2013.  After her [facility] admission they both came to live with daughter in [place]     Family History:  The patient's family history includes Diabetes in her brother; Hypertension in her mother; Stroke in her mother.     ROS:   Please see the history of present illness.    ROS All other systems reviewed and are negative.   PHYSICAL EXAM:   VS:  BP 136/60   Pulse (!) 106   Ht 5\' 6"  (1.676 m)   Wt 142 lb (64.4 kg)   SpO2 97%   BMI 22.92 kg/m    GEN: elderly and frail HEENT: normal  Neck: no JVD, carotid bruits, or masses Cardiac: irreg irreg; no murmurs, rubs, or gallops,no edema  Respiratory:  clear to auscultation bilaterally, normal work of breathing GI: soft, nontender, nondistended, + BS MS: no deformity or atrophy  Skin: warm and dry, no rash Neuro:   Alert and Oriented x 3, Strength and sensation are intact Psych: euthymic mood, full affect  Wt Readings from Last 3 Encounters:  03/21/16 142 lb (64.4 kg)  03/14/16 133 lb 9.6 oz (60.6 kg)  03/02/16 156 lb 9.6 oz (71 kg)      Studies/Labs Reviewed:   EKG:  EKG is NOT ordered today.    Recent Labs: 11/11/2015: Magnesium 1.8 03/02/2016: ALT 30; B Natriuretic Peptide 515.7 03/11/2016: TSH 5.642 03/13/2016: BUN 64; Hemoglobin 9.4; Platelets 577; Potassium 4.5; Sodium 131 03/14/2016: Creatinine, Ser 1.93   Lipid Panel    Component Value Date/Time   CHOL 126 04/07/2015 1015   TRIG 107 04/07/2015 1015   HDL 69 04/07/2015 1015   CHOLHDL 1.8 04/07/2015 1015   CHOLHDL 1.9 12/24/2013 0456   VLDL 26 12/24/2013 0456   LDLCALC 36 04/07/2015 1015    Additional studies/ records that were reviewed today include:  2D ECHO: 01/27/2016 LV EF: 55% -   60% Study Conclusions - Left ventricle: The cavity size was normal. Wall thickness was   normal. Systolic function was normal. The estimated ejection   fraction was in the range of 55% to 60%. Wall motion was normal;   there were no regional wall motion abnormalities. Doppler   parameters are consistent with abnormal left ventricular   relaxation (grade 1 diastolic dysfunction). - Aortic valve: There was mild regurgitation. - Mitral valve: There was mild regurgitation. - Left atrium: The atrium was moderately  dilated. Impressions: - Normal LV systolic function; grade 1 diastolic dysfunction; mild   AI; mild MR; moderate LAE.   ASSESSMENT & PLAN:    1. Acute on chronic diastolic heart failure: discharge weight 133lbs. Today 142 lbs. Discharged on lasix 40mg  daily. Her weight is up a little but doesn't appear volume overloaded and feeling pretty good today. Will not make any changes today. Check BMET today.  2. Chronic anticoagulation/Atrial Flutter: HR 106 today. Worry about increasing AVN blocking agents or amiodarone given her  history of bradycardia. Continue to monitor. Continue coumadin. Home health is checking coumadin, then will be seen here.   3. Aspiration pneumonia: discharged on Augmentin 500mg /125mg  po q 12 hours  4. Gastric outlet obstruction: has chronic J-tube.   5. History of stroke: continue Coumadin  6. Hyponatremia: Improved. Check BMET today  7. Chronic anemia of chronic disease   Medication Adjustments/Labs and Tests Ordered: Current medicines are reviewed at length with the patient today.  Concerns regarding medicines are outlined above.  Medication changes, Labs and Tests ordered today are listed in the Patient Instructions below. Patient Instructions  Medication Instructions:  Your physician recommends that you continue on your current medications as directed. Please refer to the Current Medication list given to you today.   Labwork: TODAY:  BMET  Testing/Procedures: None ordered  Follow-Up: Your physician recommends that you schedule a follow-up appointment in: SEE DR. Delton See AS SCHEDULED   Any Other Special Instructions Will Be Listed Below (If Applicable).     If you need a refill on your cardiac medications before your next appointment, please call your pharmacy.      Signed, Cline Crock, PA-C  03/21/2016 11:37 AM    [facility] [address] [place], [place], [place]  [address] Phone: [phone]; Fax: [phone]

## 2016-03-17 DIAGNOSIS — L89610 Pressure ulcer of right heel, unstageable: Secondary | ICD-10-CM | POA: Diagnosis not present

## 2016-03-17 DIAGNOSIS — I5033 Acute on chronic diastolic (congestive) heart failure: Secondary | ICD-10-CM | POA: Diagnosis not present

## 2016-03-17 DIAGNOSIS — I130 Hypertensive heart and chronic kidney disease with heart failure and stage 1 through stage 4 chronic kidney disease, or unspecified chronic kidney disease: Secondary | ICD-10-CM | POA: Diagnosis not present

## 2016-03-17 DIAGNOSIS — E1122 Type 2 diabetes mellitus with diabetic chronic kidney disease: Secondary | ICD-10-CM | POA: Diagnosis not present

## 2016-03-17 DIAGNOSIS — N183 Chronic kidney disease, stage 3 (moderate): Secondary | ICD-10-CM | POA: Diagnosis not present

## 2016-03-17 DIAGNOSIS — L89151 Pressure ulcer of sacral region, stage 1: Secondary | ICD-10-CM | POA: Diagnosis not present

## 2016-03-20 ENCOUNTER — Other Ambulatory Visit: Payer: Self-pay

## 2016-03-20 DIAGNOSIS — L89610 Pressure ulcer of right heel, unstageable: Secondary | ICD-10-CM | POA: Diagnosis not present

## 2016-03-20 DIAGNOSIS — I5033 Acute on chronic diastolic (congestive) heart failure: Secondary | ICD-10-CM | POA: Diagnosis not present

## 2016-03-20 DIAGNOSIS — N183 Chronic kidney disease, stage 3 (moderate): Secondary | ICD-10-CM | POA: Diagnosis not present

## 2016-03-20 DIAGNOSIS — E1122 Type 2 diabetes mellitus with diabetic chronic kidney disease: Secondary | ICD-10-CM | POA: Diagnosis not present

## 2016-03-20 DIAGNOSIS — R531 Weakness: Secondary | ICD-10-CM | POA: Diagnosis not present

## 2016-03-20 DIAGNOSIS — L89151 Pressure ulcer of sacral region, stage 1: Secondary | ICD-10-CM | POA: Diagnosis not present

## 2016-03-20 DIAGNOSIS — I130 Hypertensive heart and chronic kidney disease with heart failure and stage 1 through stage 4 chronic kidney disease, or unspecified chronic kidney disease: Secondary | ICD-10-CM | POA: Diagnosis not present

## 2016-03-20 NOTE — Patient Outreach (Signed)
[facility]) Care Management  03/20/2016  Shannon [name] [birth year] [id number]       EMMI-HF RED ON EMMI ALERT Day # 2 Date: 03/17/16 Red Alert Reason:  Any new problems? Yes  New/worsening problems/ Yes Had diarrhea or felt sick to stomach? Yes Fever or chills? Yes Other symptoms/problems? Yes   Outreach attempt #1 to patient. Spoke with dtr/caregiver-Shannon [name](ROI on file). Discussed and addressed red alerts. Dtr states she was the one who provided the responses to the call. She reports that patient has not been having any fever or chills. However, she has been having diarrhea about 3-4x/day. Dtr reports she gave patient a few doses of anti-diarrheal med recommended by pharmacist but hasn't noticed a change. Patient has had one loose stool today but has not taken any meds. She reports that patient is on dysphagia diet and is only taking thickened liquids and pudding by mouth. She also has PEG tube and gets feedings that way. Dtr states that patient has been having some drainage around gastric tube site and she feels like stomach area is "swollen."  Dtr verbalizes that patient was on antibiotic therapy while in the hospital and was discharged home with antibiotic to complete. Discussed possibility that antibiotics can sometimes cause diarrhea and advised dtr on precautions and follow up measures. She will discuss this further with [facility] RN. Patient was set up with [facility] services and [facility] RN coming out today for a visit and assessment. Dtr reports that they are also interested in palliative care services for patient and that a nurse from there was coming to visit today as well. Patient has PCP f/u app for 03/24/16. Due to patient's current condition and multiple problems discussed with dtr importance of moving appt up and being seen sooner by MD. She will f/u once home visit by [facility] and palliative care made today. Family provided transportation for patient and dtr managing patient's meds.  Dtr did not feel like [name] services needed at this time due to other agencies in place. Advised dtr that patient would continue to get automated EMMI HF calls to check on her and if any responses are abnormal it would trigger a call from [facility] RN to check on patient. She voiced understanding and was appreciative of call.     Plan: RN CM will    Enzo Montgomery, RN,BSN,CCM [facility] Telephonic Care Management Coordinator Direct Phone: [phone] Toll Free: [phone] Fax: [phone]

## 2016-03-21 ENCOUNTER — Encounter: Payer: Self-pay | Admitting: Physician Assistant

## 2016-03-21 ENCOUNTER — Other Ambulatory Visit: Payer: Self-pay | Admitting: Physician Assistant

## 2016-03-21 ENCOUNTER — Ambulatory Visit (INDEPENDENT_AMBULATORY_CARE_PROVIDER_SITE_OTHER): Payer: Medicare Other | Admitting: Physician Assistant

## 2016-03-21 VITALS — BP 136/60 | HR 106 | Ht 66.0 in | Wt 142.0 lb

## 2016-03-21 DIAGNOSIS — R001 Bradycardia, unspecified: Secondary | ICD-10-CM | POA: Diagnosis not present

## 2016-03-21 DIAGNOSIS — I5032 Chronic diastolic (congestive) heart failure: Secondary | ICD-10-CM

## 2016-03-21 DIAGNOSIS — I4892 Unspecified atrial flutter: Secondary | ICD-10-CM | POA: Diagnosis not present

## 2016-03-21 DIAGNOSIS — Z8673 Personal history of transient ischemic attack (TIA), and cerebral infarction without residual deficits: Secondary | ICD-10-CM

## 2016-03-21 DIAGNOSIS — I10 Essential (primary) hypertension: Secondary | ICD-10-CM

## 2016-03-21 LAB — BASIC METABOLIC PANEL
BUN: 60 mg/dL — ABNORMAL HIGH (ref 7–25)
CO2: 25 mmol/L (ref 20–31)
Calcium: 9.5 mg/dL (ref 8.6–10.4)
Chloride: 98 mmol/L (ref 98–110)
Creat: 1.85 mg/dL — ABNORMAL HIGH (ref 0.60–0.88)
Glucose, Bld: 151 mg/dL — ABNORMAL HIGH (ref 65–99)
Potassium: 5.3 mmol/L (ref 3.5–5.3)
Sodium: 135 mmol/L (ref 135–146)

## 2016-03-21 MED ORDER — FUROSEMIDE 40 MG PO TABS: 40.0000 mg | ORAL_TABLET | Freq: Every day | ORAL | 12 refills | Status: DC

## 2016-03-21 MED ORDER — IPRATROPIUM-ALBUTEROL 0.5-2.5 (3) MG/3ML IN SOLN: 3.0000 mL | Freq: Four times a day (QID) | RESPIRATORY_TRACT | 4 refills | Status: DC | PRN

## 2016-03-21 NOTE — Addendum Note (Signed)
Addended by: Eulis Foster on: 03/21/2016 11:38 AM   Modules accepted: Orders

## 2016-03-21 NOTE — Patient Instructions (Addendum)
Medication Instructions:  Your physician recommends that you continue on your current medications as directed. Please refer to the Current Medication list given to you today.   Labwork: TODAY:  BMET  Testing/Procedures: None ordered  Follow-Up: Your physician recommends that you schedule a follow-up appointment in: SEE DR. Meda Coffee AS SCHEDULED   Any Other Special Instructions Will Be Listed Below (If Applicable).     If you need a refill on your cardiac medications before your next appointment, please call your pharmacy.

## 2016-03-22 ENCOUNTER — Ambulatory Visit (INDEPENDENT_AMBULATORY_CARE_PROVIDER_SITE_OTHER): Payer: Medicare Other | Admitting: Internal Medicine

## 2016-03-22 DIAGNOSIS — I82442 Acute embolism and thrombosis of left tibial vein: Secondary | ICD-10-CM

## 2016-03-22 DIAGNOSIS — I5033 Acute on chronic diastolic (congestive) heart failure: Secondary | ICD-10-CM | POA: Diagnosis not present

## 2016-03-22 DIAGNOSIS — N183 Chronic kidney disease, stage 3 (moderate): Secondary | ICD-10-CM | POA: Diagnosis not present

## 2016-03-22 DIAGNOSIS — I130 Hypertensive heart and chronic kidney disease with heart failure and stage 1 through stage 4 chronic kidney disease, or unspecified chronic kidney disease: Secondary | ICD-10-CM | POA: Diagnosis not present

## 2016-03-22 DIAGNOSIS — L89151 Pressure ulcer of sacral region, stage 1: Secondary | ICD-10-CM | POA: Diagnosis not present

## 2016-03-22 DIAGNOSIS — L89610 Pressure ulcer of right heel, unstageable: Secondary | ICD-10-CM | POA: Diagnosis not present

## 2016-03-22 DIAGNOSIS — I4892 Unspecified atrial flutter: Secondary | ICD-10-CM

## 2016-03-22 DIAGNOSIS — Z5181 Encounter for therapeutic drug level monitoring: Secondary | ICD-10-CM

## 2016-03-22 DIAGNOSIS — E1122 Type 2 diabetes mellitus with diabetic chronic kidney disease: Secondary | ICD-10-CM | POA: Diagnosis not present

## 2016-03-22 LAB — POCT INR: INR: 1.3

## 2016-03-23 DIAGNOSIS — L89151 Pressure ulcer of sacral region, stage 1: Secondary | ICD-10-CM | POA: Diagnosis not present

## 2016-03-23 DIAGNOSIS — N183 Chronic kidney disease, stage 3 (moderate): Secondary | ICD-10-CM | POA: Diagnosis not present

## 2016-03-23 DIAGNOSIS — L89610 Pressure ulcer of right heel, unstageable: Secondary | ICD-10-CM | POA: Diagnosis not present

## 2016-03-23 DIAGNOSIS — E1122 Type 2 diabetes mellitus with diabetic chronic kidney disease: Secondary | ICD-10-CM | POA: Diagnosis not present

## 2016-03-23 DIAGNOSIS — I5033 Acute on chronic diastolic (congestive) heart failure: Secondary | ICD-10-CM | POA: Diagnosis not present

## 2016-03-23 DIAGNOSIS — I130 Hypertensive heart and chronic kidney disease with heart failure and stage 1 through stage 4 chronic kidney disease, or unspecified chronic kidney disease: Secondary | ICD-10-CM | POA: Diagnosis not present

## 2016-03-24 ENCOUNTER — Other Ambulatory Visit: Payer: Self-pay

## 2016-03-24 ENCOUNTER — Ambulatory Visit (INDEPENDENT_AMBULATORY_CARE_PROVIDER_SITE_OTHER): Payer: Medicare Other | Admitting: Internal Medicine

## 2016-03-24 ENCOUNTER — Encounter: Payer: Self-pay | Admitting: Internal Medicine

## 2016-03-24 VITALS — BP 122/68 | HR 81 | Temp 97.4°F | Ht 66.0 in | Wt 143.4 lb

## 2016-03-24 DIAGNOSIS — I5032 Chronic diastolic (congestive) heart failure: Secondary | ICD-10-CM

## 2016-03-24 DIAGNOSIS — J45909 Unspecified asthma, uncomplicated: Secondary | ICD-10-CM | POA: Diagnosis not present

## 2016-03-24 DIAGNOSIS — Z931 Gastrostomy status: Secondary | ICD-10-CM

## 2016-03-24 DIAGNOSIS — I10 Essential (primary) hypertension: Secondary | ICD-10-CM

## 2016-03-24 DIAGNOSIS — R059 Cough, unspecified: Secondary | ICD-10-CM

## 2016-03-24 DIAGNOSIS — K311 Adult hypertrophic pyloric stenosis: Secondary | ICD-10-CM | POA: Diagnosis not present

## 2016-03-24 DIAGNOSIS — E1122 Type 2 diabetes mellitus with diabetic chronic kidney disease: Secondary | ICD-10-CM

## 2016-03-24 DIAGNOSIS — E032 Hypothyroidism due to medicaments and other exogenous substances: Secondary | ICD-10-CM | POA: Diagnosis not present

## 2016-03-24 DIAGNOSIS — R05 Cough: Secondary | ICD-10-CM

## 2016-03-24 DIAGNOSIS — I82542 Chronic embolism and thrombosis of left tibial vein: Secondary | ICD-10-CM | POA: Diagnosis not present

## 2016-03-24 DIAGNOSIS — I4892 Unspecified atrial flutter: Secondary | ICD-10-CM | POA: Diagnosis not present

## 2016-03-24 DIAGNOSIS — N183 Chronic kidney disease, stage 3 unspecified: Secondary | ICD-10-CM

## 2016-03-24 MED ORDER — BENZONATATE 200 MG PO CAPS: 200.0000 mg | ORAL_CAPSULE | Freq: Three times a day (TID) | ORAL | 1 refills | Status: DC | PRN

## 2016-03-24 NOTE — Patient Instructions (Signed)
May try Glucerna for nutritional supplement instead of Boost  Continue current medications as ordered  Follow up with specialists as scheduled  Continue tube feeds as ordered  Follow up with podiatry as scheduled  Continue Home Health/palliative care  Follow up in 1-2 mos for routine visit

## 2016-03-24 NOTE — Progress Notes (Signed)
Patient ID: Shannon [name], female   DOB: 09-May-1923, 82 y.o.   MRN: [id number]    Location:  [facility] Place of Service: OFFICE  Chief Complaint  Patient presents with  . Hospitalization Follow-up    HPI:  82 yo female seen today following hospital stay 11/30-12/12th for acute/chronic dHF (NYHA class IV), LLL aspiration pneumonia, CKD, DM, gastric outlet obstruction, dysphagia s/p Peg, atrial flutter on coumadin, pressure injury of skin. Cardiology followed pt during admission. She was diuresed aggressively  to negative 5 L with improvement of SOB. Weight down 21 lbs. palliative care consulted and she was d/c'd with Hospice at home. INR therapeutic at d/c (2.21). Atrial flutter rate 3:1. abx tx with IV Zosyn-->po augmentin.IR checked J tube. Cr 1.93; Hgb 9.4; A1c 7.4%; TSH 5.642; albumin 3.4; Na 131 at d/c.  Today she reports SOB stable. She has seen cardiology since d/c. She c/o cough that is productive with yellow mucous. She was taking tessalon perles in the hospital but was not given rx at d/c. She has gum pain and is scheduled to see dentist in 2 weeks. She wonders if it is her dentures vs 6 remaining teeth. Family decided against Hospice but she is receiving palliative care services  She has seasonal allergy sx's and takes claritin daily and has flonase. She takes advair for asthma  No seizures since last OV. Takes gabapentin  HTN/afib/aflutter/hyperlipidemia/chronic dHF - BP controlled on hydralazine, imdur. Cholesterol stable on lipitor. Rate controlled on amiodarone and cardizem. She takes lasix. She takes coumadin for anticoagulation. INR Followed by cardio  GOO - s/p GJ tube. Takes Protonix BID. Constipation stable on lactulose. She gets TF at night. Drinks liquids po. Takes zofran prn  Hyponatremia/ hx SIADH- she was taking salt tabs but over the last several weeks has noticed the entire tab in her stool intermittently. Na 131  DM - diet controlled. A1c 7.4%. Takes gabapentin for  neuropathy  Hx CVA - stable. Takes ASA daily  Depression - mood stable on sertraline  Arthritis - joint pain stable on tramadol, norco.  Hypothyroidism - takes levothyroxine 137 mcg daily. TSH 5.642  Pressure sore/right 3rd toe osteomyelitis - completed 1 month of augmentin. Followed by podiatry. She has PAD  COPD - takes advair 250/50, proventil hfa, duonebs. Followed by pulmonary. No recent exacerbations    Past Medical History:  Diagnosis Date  . Anemia    previous blood transfusions  . Arthritis    "all over"  . Asthma   . Bradycardia    requiring previous d/c of BB and reduction of amiodarone  . CAD (coronary artery disease)    nonobstructive per notes  . Chronic diastolic CHF (congestive heart failure) (HCC)   . CKD (chronic kidney disease), stage III   . Complication of blood transfusion    "got the wrong blood type at [facility] in ~ 2015; no adverse reaction that we are aware of"/daughter, [name] (01/27/2016)  . COPD (chronic obstructive pulmonary disease) (HCC)   . Depression    "light case"  . DVT (deep venous thrombosis) (HCC) 01/2016   a. LLE DVT 01/2016 - switched from Eliquis to Coumadin.  . Gastric stenosis    a. s/p stomach tube  . GERD (gastroesophageal reflux disease)   . History of blood transfusion    "several" (01/27/2016)  . History of stomach ulcers   . Hyperlipidemia   . Hypertension   . Hypothyroidism   . Paraesophageal hernia   . Perforated gastric ulcer (HCC)   .  Seasonal allergies   . SIADH (syndrome of inappropriate ADH production) (HCC)    [name] 01/10/2015  . Small bowel obstruction    "I don't know how many" (01/11/2015)  . Stroke (HCC)    "light one"  . Type II diabetes mellitus (HCC)    "related to prednisone use  for > 20 yr; once predinose stopped; no more DM RX" (01/27/2016)  . Ventral hernia with bowel obstruction     Past Surgical History:  Procedure Laterality Date  . CATARACT EXTRACTION W/ INTRAOCULAR LENS  IMPLANT,  BILATERAL    . CHOLECYSTECTOMY OPEN    . COLECTOMY    . ESOPHAGOGASTRODUODENOSCOPY N/A 01/19/2014   Procedure: ESOPHAGOGASTRODUODENOSCOPY (EGD);  Surgeon: Hilarie Fredrickson, MD;  Location: [facility];  Service: Endoscopy;  Laterality: N/A;  . ESOPHAGOGASTRODUODENOSCOPY N/A 01/20/2014   Procedure: ESOPHAGOGASTRODUODENOSCOPY (EGD);  Surgeon: Hilarie Fredrickson, MD;  Location: [facility];  Service: Endoscopy;  Laterality: N/A;  . ESOPHAGOGASTRODUODENOSCOPY N/A 03/19/2014   Procedure: ESOPHAGOGASTRODUODENOSCOPY (EGD);  Surgeon: Rachael Fee, MD;  Location: [facility];  Service: Endoscopy;  Laterality: N/A;  . ESOPHAGOGASTRODUODENOSCOPY N/A 07/08/2015   Procedure: ESOPHAGOGASTRODUODENOSCOPY (EGD);  Surgeon: Sherrilyn Rist, MD;  Location: [facility];  Service: Endoscopy;  Laterality: N/A;  . ESOPHAGOGASTRODUODENOSCOPY (EGD) WITH PROPOFOL N/A 09/15/2015   Procedure: ESOPHAGOGASTRODUODENOSCOPY (EGD) WITH PROPOFOL;  Surgeon: Ruffin Frederick, MD;  Location: [facility];  Service: Gastroenterology;  Laterality: N/A;  . GASTROJEJUNOSTOMY     hx/notes 01/10/2015  . GASTROJEJUNOSTOMY N/A 09/23/2015   Procedure: OPEN GASTROJEJUNOSTOMY TUBE PLACEMENT;  Surgeon: De Blanch Kinsinger, MD;  Location: [facility]S;  Service: General;  Laterality: N/A;  . GLAUCOMA SURGERY Bilateral   . HERNIA REPAIR  2015  . IR GENERIC HISTORICAL  01/07/2016   IR GJ TUBE CHANGE 01/07/2016 Malachy Moan, MD [facility]  . IR GENERIC HISTORICAL  01/27/2016   IR MECH REMOV OBSTRUC MAT ANY COLON TUBE W/FLUORO 01/27/2016 Richarda Overlie, MD [facility]  . IR GENERIC HISTORICAL  02/07/2016   IR PATIENT EVAL TECH 0-60 MINS Darrell K Allred, PA-C [facility]  . IR GENERIC HISTORICAL  02/08/2016   IR GJ TUBE CHANGE 02/08/2016 Berdine Dance, MD [facility]  . LAPAROTOMY N/A 01/20/2015   Procedure: EXPLORATORY LAPAROTOMY;  Surgeon: Abigail Miyamoto, MD;  Location: [facility] OR;  Service: General;  Laterality: N/A;  . LYSIS OF ADHESION N/A  01/20/2015   Procedure: LYSIS OF ADHESIONS < 1 HOUR;  Surgeon: Abigail Miyamoto, MD;  Location: [facility] OR;  Service: General;  Laterality: N/A;  . TONSILLECTOMY    . TUBAL LIGATION    . VENTRAL HERNIA REPAIR  2015   incarcerated ventral hernia ([facility] 09/2013)/notes 01/10/2015    Patient Care Team: Kirt Boys, DO as PCP - General (Internal Medicine) Jason Coop, MD as Referring Physician (Cardiology) Carole Binning, MD as Referring Physician (Ophthalmology)  Social History   Social History  . Marital status: Widowed    Spouse name: N/A  . Number of children: 3  . Years of education: N/A   Occupational History  . Not on file.   Social History Main Topics  . Smoking status: Never Smoker  . Smokeless tobacco: Former Neurosurgeon    Types: Snuff     Comment: "used snuff in my younger days"  . Alcohol use No  . Drug use: No  . Sexual activity: No   Other Topics Concern  . Not on file   Social History Narrative   Married in [place], lives  in one story house with 2 people and no pets   Occupation: ?   Has a living Will, POA, and doesn't have DNR   Was living with Husband (in frail health) in [place] until 09/2013.  After her [facility] admission they both came to live with daughter in [place]     reports that she has never smoked. She has quit using smokeless tobacco. Her smokeless tobacco use included Snuff. She reports that she does not drink alcohol or use drugs.  Family History  Problem Relation Age of Onset  . Stroke Mother   . Hypertension Mother   . Diabetes Brother   . Heart attack Neg Hx    Family Status  Relation Status  . Mother Deceased  . Father Deceased  . Maternal Grandmother Deceased  . Maternal Grandfather Deceased  . Paternal Grandmother Deceased  . Paternal Grandfather Deceased  . Brother   . Neg Hx      Allergies  Allergen Reactions  . Penicillins Itching    Has patient had a PCN reaction causing immediate rash, facial/tongue/throat  swelling, SOB or lightheadedness with hypotension: Unknown Has patient had a PCN reaction causing severe rash involving mucus membranes or skin necrosis: Unknown Has patient had a PCN reaction that required hospitalization: Unknown Has patient had a PCN reaction occurring within the last 10 years: Unknown If all of the above answers are "NO", then may proceed with Cephalosporin use.     Medications: Patient's Medications  New Prescriptions   No medications on file  Previous Medications   ACETAMINOPHEN (TYLENOL) 325 MG TABLET    Take 650 mg by mouth 2 (two) times daily as needed for mild pain.    ADVAIR DISKUS 250-50 MCG/DOSE AEPB    INHALE 1 PUFF INTO THE LUNGS 2 (TWO) TIMES DAILY AS NEEDED FOR SHORTNESS OF BREATH   AMINO ACIDS-PROTEIN HYDROLYS PO    Take 30 mLs by mouth 2 (two) times daily.   AMIODARONE (PACERONE) 200 MG TABLET    Take 200 mg by mouth daily.   ATORVASTATIN (LIPITOR) 10 MG TABLET    Take 10 mg by mouth daily.   B COMPLEX VITAMINS TABLET    Take 1 tablet by mouth daily.    BISACODYL (DULCOLAX) 10 MG SUPPOSITORY    Place 1 suppository (10 mg total) rectally daily as needed for moderate constipation.   CALCIUM-VITAMIN D (OSCAL WITH D) 500-200 MG-UNIT PER TABLET    Take 1 tablet by mouth daily with breakfast.    CETIRIZINE (ZYRTEC) 10 MG TABLET    Take 10 mg by mouth daily as needed for allergies or rhinitis.   CHLORHEXIDINE (PERIDEX) 0.12 % SOLUTION    Use as directed 15 mLs in the mouth or throat 2 (two) times daily.   DILTIAZEM (CARDIZEM CD) 120 MG 24 HR CAPSULE    Take 1 capsule (120 mg total) by mouth daily.   FEEDING SUPPLEMENT (BOOST / RESOURCE BREEZE) LIQD    Take 1 Container by mouth daily.   FLUTICASONE (FLONASE) 50 MCG/ACT NASAL SPRAY    Place 2 sprays into both nostrils daily.   FUROSEMIDE (LASIX) 40 MG TABLET    Take 1 tablet (40 mg total) by mouth daily.   GABAPENTIN (NEURONTIN) 300 MG CAPSULE    Take 1 capsule (300 mg total) by mouth 3 (three) times daily.    HYDRALAZINE (APRESOLINE) 50 MG TABLET    Take 1 tablet (50 mg total) by mouth 3 (three) times daily.   HYDROCODONE-ACETAMINOPHEN (  NORCO) 5-325 MG TABLET    Take 1 tablet by mouth every 6 (six) hours as needed for moderate pain.   IPRATROPIUM-ALBUTEROL (DUONEB) 0.5-2.5 (3) MG/3ML SOLN    Take 3 mLs by nebulization every 6 (six) hours as needed (sob). Use 3 times daily x 4 days then every 6 hours as needed.   ISOSORBIDE MONONITRATE (IMDUR) 30 MG 24 HR TABLET    TAKE 1 TABLET (30 MG TOTAL) BY MOUTH DAILY.   LATANOPROST (XALATAN) 0.005 % OPHTHALMIC SOLUTION    Place 1 drop into both eyes at bedtime.   LEVALBUTEROL (XOPENEX HFA) 45 MCG/ACT INHALER    Inhale 2 puffs into the lungs every 6 (six) hours as needed for wheezing or shortness of breath.   LEVOTHYROXINE (SYNTHROID, LEVOTHROID) 137 MCG TABLET    Take 1 tablet (137 mcg total) by mouth daily before breakfast.   MALTODEXTRIN-XANTHAN GUM (RESOURCE THICKENUP CLEAR) POWD    Take 120 g by mouth as needed (use to thicken liquids.).   MULTIPLE VITAMIN (MULTIVITAMIN WITH MINERALS) TABS TABLET    Take 1 tablet by mouth daily.   NUTRITIONAL SUPPLEMENTS (FEEDING SUPPLEMENT, OSMOLITE 1.5 CAL,) LIQD    Place 1,000 mLs into feeding tube daily.   PANTOPRAZOLE (PROTONIX) 40 MG TABLET    TAKE 1 TABLET BY MOUTH TWICE A DAY   POLYETHYL GLYCOL-PROPYL GLYCOL 0.4-0.3 % SOLN    Place 1 drop into both eyes 4 (four) times daily.    POLYETHYLENE GLYCOL POWDER (GLYCOLAX/MIRALAX) POWDER    MIX 17GRAMS IN 8 OUNCES OF LIQUID AND DRINK TWICE DAILY AS NEEDED   RESTASIS 0.05 % OPHTHALMIC EMULSION    USE 1 DROP INTO BOTH EYES TWICE DAILY   SERTRALINE (ZOLOFT) 100 MG TABLET    Take one tablet by mouth once daily for depression   TRAMADOL (ULTRAM) 50 MG TABLET    Take 50 mg by mouth daily as needed for moderate pain.    WARFARIN (COUMADIN) 2.5 MG TABLET    Take as directed by [facility].   WATER FOR IRRIGATION, STERILE (FREE WATER) SOLN    Place 230 mLs into feeding tube 3  (three) times daily.  Modified Medications   No medications on file  Discontinued Medications   No medications on file    Review of Systems  Respiratory: Positive for shortness of breath.   Gastrointestinal: Positive for abdominal pain.  Musculoskeletal: Positive for arthralgias and neck pain.  All other systems reviewed and are negative.   Vitals:   03/24/16 0853  BP: 122/68  Pulse: 81  Temp: 97.4 F (36.3 C)  TempSrc: Oral  SpO2: 90%  Weight: 143 lb 6.4 oz (65 kg)  Height: 5\' 6"  (1.676 m)   Body mass index is 23.15 kg/m.  Physical Exam  Constitutional: She is oriented to person, place, and time. She appears well-developed.  Frail appearing, sitting upright in chair, min conversational dyspnea. Daughter present  HENT:  Mouth/Throat: Oropharynx is clear and moist. No oropharyngeal exudate.  MMM  Eyes: Pupils are equal, round, and reactive to light. No scleral icterus.  Neck: Neck supple. Carotid bruit is not present. No tracheal deviation present. No thyromegaly present.  Cardiovascular: Normal rate, regular rhythm and intact distal pulses.  Exam reveals no gallop and no friction rub.   Murmur (1/6 SEM) heard. +1 pitting LLE edema with calf TTP and palpable cord; trace RLE with No calf TTP; loss of hair on leg b/l due to swelling. Left calf varicose veins, soft, NT  Pulmonary/Chest:  Effort normal. No stridor. No respiratory distress. She has no wheezes. She has no rales.  Abdominal: Soft. Bowel sounds are normal. She exhibits no distension and no mass. There is no hepatomegaly. There is tenderness (epigastric). There is no rebound and no guarding.  GJ tube intact with no signs of redness or d/c at insertion site; NT.  Musculoskeletal: She exhibits edema.  Lymphadenopathy:    She has no cervical adenopathy.  Neurological: She is alert and oriented to person, place, and time. Gait (uses rolling walker) abnormal.  Skin: Skin is warm and dry. No rash noted.  Psychiatric:  She has a normal mood and affect. Her behavior is normal. Thought content normal.     Labs reviewed: Anti-coag visit on 03/22/2016  Component Date Value Ref Range Status  . INR 03/22/2016 1.3   Final  Office Visit on 03/21/2016  Component Date Value Ref Range Status  . Sodium 03/21/2016 135  135 - 146 mmol/L Final  . Potassium 03/21/2016 5.3  3.5 - 5.3 mmol/L Final  . Chloride 03/21/2016 98  98 - 110 mmol/L Final  . CO2 03/21/2016 25  20 - 31 mmol/L Final  . Glucose, Bld 03/21/2016 151* 65 - 99 mg/dL Final  . BUN 08/65/7846 60* 7 - 25 mg/dL Final  . Creat 96/29/5284 1.85* 0.60 - 0.88 mg/dL Final   Comment:   For patients > or = 80 years of age: The upper reference limit for Creatinine is approximately 13% higher for people identified as African-American.     . Calcium 03/21/2016 9.5  8.6 - 10.4 mg/dL Final  Anti-coag visit on 03/15/2016  Component Date Value Ref Range Status  . INR 03/15/2016 2.0   Final  Admission on 03/02/2016, Discharged on 03/14/2016  No results displayed because visit has over 200 results.    Admission on 02/28/2016, Discharged on 02/28/2016  Component Date Value Ref Range Status  . WBC 02/28/2016 4.3  4.0 - 10.5 K/uL Final  . RBC 02/28/2016 2.81* 3.87 - 5.11 MIL/uL Final  . Hemoglobin 02/28/2016 8.3* 12.0 - 15.0 g/dL Final  . HCT 13/24/4010 23.8* 36.0 - 46.0 % Final  . MCV 02/28/2016 84.7  78.0 - 100.0 fL Final  . MCH 02/28/2016 29.5  26.0 - 34.0 pg Final  . MCHC 02/28/2016 34.9  30.0 - 36.0 g/dL Final  . RDW 27/25/3664 17.5* 11.5 - 15.5 % Final  . Platelets 02/28/2016 271  150 - 400 K/uL Final  . Neutrophils Relative % 02/28/2016 60  % Final  . Neutro Abs 02/28/2016 2.6  1.7 - 7.7 K/uL Final  . Lymphocytes Relative 02/28/2016 23  % Final  . Lymphs Abs 02/28/2016 1.0  0.7 - 4.0 K/uL Final  . Monocytes Relative 02/28/2016 14  % Final  . Monocytes Absolute 02/28/2016 0.6  0.1 - 1.0 K/uL Final  . Eosinophils Relative 02/28/2016 3  % Final  .  Eosinophils Absolute 02/28/2016 0.1  0.0 - 0.7 K/uL Final  . Basophils Relative 02/28/2016 0  % Final  . Basophils Absolute 02/28/2016 0.0  0.0 - 0.1 K/uL Final  . Sodium 02/28/2016 122* 135 - 145 mmol/L Final  . Potassium 02/28/2016 4.7  3.5 - 5.1 mmol/L Final  . Chloride 02/28/2016 91* 101 - 111 mmol/L Final  . CO2 02/28/2016 23  22 - 32 mmol/L Final  . Glucose, Bld 02/28/2016 121* 65 - 99 mg/dL Final  . BUN 40/34/7425 35* 6 - 20 mg/dL Final  . Creatinine, Ser 02/28/2016 1.20* 0.44 - 1.00  mg/dL Final  . Calcium 95/62/1308 8.9  8.9 - 10.3 mg/dL Final  . GFR calc non Af Amer 02/28/2016 39* >60 mL/min Final  . GFR calc Af Amer 02/28/2016 45* >60 mL/min Final   Comment: (NOTE) The eGFR has been calculated using the CKD EPI equation. This calculation has not been validated in all clinical situations. eGFR's persistently <60 mL/min signify possible Chronic Kidney Disease.   . Anion gap 02/28/2016 8  5 - 15 Final  . Sed Rate 02/28/2016 36* 0 - 22 mm/hr Final  . Troponin I 02/28/2016 <0.03  <0.03 ng/mL Final  . Sodium 02/28/2016 122* 135 - 145 mmol/L Final  . Potassium 02/28/2016 4.7  3.5 - 5.1 mmol/L Final  . Chloride 02/28/2016 90* 101 - 111 mmol/L Final  . BUN 02/28/2016 31* 6 - 20 mg/dL Final  . Creatinine, Ser 02/28/2016 1.30* 0.44 - 1.00 mg/dL Final  . Glucose, Bld 65/78/4696 119* 65 - 99 mg/dL Final  . Calcium, Ion 29/52/8413 1.16  1.15 - 1.40 mmol/L Final  . TCO2 02/28/2016 23  0 - 100 mmol/L Final  . Hemoglobin 02/28/2016 9.2* 12.0 - 15.0 g/dL Final  . HCT 24/40/1027 27.0* 36.0 - 46.0 % Final  . Prothrombin Time 02/28/2016 17.3* 11.4 - 15.2 seconds Final  . INR 02/28/2016 1.40   Final  Anti-coag visit on 02/21/2016  Component Date Value Ref Range Status  . INR 02/21/2016 1.7   Final  Office Visit on 02/17/2016  Component Date Value Ref Range Status  . INR 02/17/2016 2.5   Final  . Sodium 02/17/2016 130* 135 - 146 mmol/L Final  . Potassium 02/17/2016 5.2  3.5 - 5.3 mmol/L  Final  . Chloride 02/17/2016 98  98 - 110 mmol/L Final  . CO2 02/17/2016 23  20 - 31 mmol/L Final  . Glucose, Bld 02/17/2016 102* 65 - 99 mg/dL Final  . BUN 25/36/6440 51* 7 - 25 mg/dL Final  . Creat 34/74/2595 1.46* 0.60 - 0.88 mg/dL Final   Comment:   For patients > or = 80 years of age: The upper reference limit for Creatinine is approximately 13% higher for people identified as African-American.     . Calcium 02/17/2016 9.2  8.6 - 10.4 mg/dL Final  . GFR, Est African American 02/17/2016 36* >=60 mL/min Final  . GFR, Est Non African American 02/17/2016 31* >=60 mL/min Final  Anti-coag visit on 02/16/2016  Component Date Value Ref Range Status  . INR 02/15/2016 4.7   Final  Office Visit on 02/14/2016  Component Date Value Ref Range Status  . TSH 02/15/2016 7.34* mIU/L Final   Comment:   Reference Range   > or = 20 Years  0.40-4.50   Pregnancy Range First trimester  0.26-2.66 Second trimester 0.55-2.73 Third trimester  0.43-2.91     . Free T4 02/15/2016 1.5  0.8 - 1.8 ng/dL Final  . Sodium 63/87/5643 130* 135 - 146 mmol/L Final  . Potassium 02/15/2016 5.4* 3.5 - 5.3 mmol/L Final  . Chloride 02/15/2016 97* 98 - 110 mmol/L Final  . CO2 02/15/2016 23  20 - 31 mmol/L Final  . Glucose, Bld 02/15/2016 172* 65 - 99 mg/dL Final  . BUN 32/95/1884 55* 7 - 25 mg/dL Final  . Creat 16/60/6301 1.64* 0.60 - 0.88 mg/dL Final   Comment:   For patients > or = 80 years of age: The upper reference limit for Creatinine is approximately 13% higher for people identified as African-American.     . Calcium  02/15/2016 9.1  8.6 - 10.4 mg/dL Final  . WBC 16/01/9603 9.6  3.8 - 10.8 K/uL Final  . RBC 02/15/2016 3.18* 3.80 - 5.10 MIL/uL Final  . Hemoglobin 02/15/2016 9.2* 11.7 - 15.5 g/dL Final  . HCT 54/12/8117 28.7* 35.0 - 45.0 % Final  . MCV 02/15/2016 90.3  80.0 - 100.0 fL Final  . MCH 02/15/2016 28.9  27.0 - 33.0 pg Final  . MCHC 02/15/2016 32.1  32.0 - 36.0 g/dL Final  . RDW 14/78/2956  16.9* 11.0 - 15.0 % Final  . Platelets 02/15/2016 360  140 - 400 K/uL Final  . MPV 02/15/2016 9.5  7.5 - 12.5 fL Final  . Prothrombin Time 02/15/2016 50.1* 9.0 - 11.5 sec Final   Comment:   For more information on this test, go to: http://education.[facility].com/faq/FAQ104     . INR 02/15/2016 5.2*  Final   Comment: Verified by repeat analysis.   Reference Range                        0.9-1.1 Moderate-intensity Warfarin Therapy    2.0-3.0 Higher-intensity Warfarin Therapy      3.0-4.0     . Brain Natriuretic Peptide 02/15/2016 40.9  <100 pg/mL Final   Comment:   BNP levels increase with age in the general population with the highest values seen in individuals greater than 67 years of age. Reference: Earlyne Iba Cardiol 2002; 21:308-65.     Admission on 02/07/2016, Discharged on 02/12/2016  No results displayed because visit has over 200 results.    There may be more visits with results that are not included.    Dg Chest 2 View  Result Date: 03/06/2016 CLINICAL DATA:  Shortness of breath with cough and chest congestion today. History of asthma -COPD, coronary artery disease, nonsmoker. EXAM: CHEST  2 VIEW COMPARISON:  Portable chest x-ray of March 05, 2016 FINDINGS: The lungs are reasonably well inflated. The cardiac silhouette remains enlarged. The central pulmonary vascularity is mildly prominent and slightly more conspicuous than on yesterday's study. There is calcification in the wall of the aortic arch. The mediastinum is normal in width. There is no significant pleural effusion. The observed bony thorax is unremarkable. IMPRESSION: Chronic bronchitic changes. Probable superimposed low-grade CHF. No discrete pneumonia. Thoracic aortic atherosclerosis. Electronically Signed   By: David  [place] M.D.   On: 03/06/2016 10:28   Dg Chest 2 View  Result Date: 03/02/2016 CLINICAL DATA:  Shortness of Breath EXAM: CHEST  2 VIEW COMPARISON:  02/07/2016 FINDINGS: Cardiomediastinal  silhouette is stable. There is chronic elevation of the right hemidiaphragm. There is streaky left base retrocardiac atelectasis or infiltrate best seen on lateral view. Mild compression deformity lower thoracic spine of indeterminate age. Clinical correlation is necessary. IMPRESSION: There is streaky left base retrocardiac atelectasis or infiltrate best seen on lateral view. Mild compression deformity lower thoracic spine of indeterminate age. Clinical correlation is necessary. Electronically Signed   By: Natasha Mead M.D.   On: 03/02/2016 14:00   Ct Head Wo Contrast  Result Date: 02/28/2016 CLINICAL DATA:  Jaw pain.  Pain and swelling. EXAM: CT HEAD WITHOUT CONTRAST CT NECK WITHOUT CONTRAST TECHNIQUE: Contiguous axial images were obtained from the base of the skull through the vertex without contrast. Multidetector CT imaging of the neck was performed using the standard protocol without intravenous contrast. COMPARISON:  Head CT 11/06/2015, sinus CT 01/27/2016 FINDINGS: CT HEAD FINDINGS Brain: No acute intracranial hemorrhage. No focal mass lesion. No  CT evidence of acute infarction. No midline shift or mass effect. No hydrocephalus. Basilar cisterns are patent. There are periventricular and subcortical white matter hypodensities. Generalized cortical atrophy. Vascular: No hyperdense vessel or unexpected calcification. Skull: Normal. Negative for fracture or focal lesion. Sinuses/Orbits: No acute finding. Other: None. CT NECK FINDINGS Pharynx and larynx: Normal. No mass or swelling. Salivary glands: No inflammation, mass, or stone. Thyroid: Normal. Lymph nodes: None enlarged or abnormal density. Vascular: Negative. Mastoids and visualized paranasal sinuses: Clear. Skeleton: Motion degradation limits evaluation of the available. Degenerate change in the cervical spine Set hypertrophy, osteophytosis and joint space narrowing. Degenerative cystic change in the dense Upper chest: Clear Other: Motion degradation  IMPRESSION: 1. No acute intracranial findings. 2. Atrophy and white matter microvascular disease. 3. No acute findings in the neck. 4. Motion degradation limits evaluation of the mandible. No abscess or inflammation identified. Electronically Signed   By: Genevive Bi M.D.   On: 02/28/2016 08:04   Ct Soft Tissue Neck Wo Contrast  Result Date: 02/28/2016 CLINICAL DATA:  Jaw pain.  Pain and swelling. EXAM: CT HEAD WITHOUT CONTRAST CT NECK WITHOUT CONTRAST TECHNIQUE: Contiguous axial images were obtained from the base of the skull through the vertex without contrast. Multidetector CT imaging of the neck was performed using the standard protocol without intravenous contrast. COMPARISON:  Head CT 11/06/2015, sinus CT 01/27/2016 FINDINGS: CT HEAD FINDINGS Brain: No acute intracranial hemorrhage. No focal mass lesion. No CT evidence of acute infarction. No midline shift or mass effect. No hydrocephalus. Basilar cisterns are patent. There are periventricular and subcortical white matter hypodensities. Generalized cortical atrophy. Vascular: No hyperdense vessel or unexpected calcification. Skull: Normal. Negative for fracture or focal lesion. Sinuses/Orbits: No acute finding. Other: None. CT NECK FINDINGS Pharynx and larynx: Normal. No mass or swelling. Salivary glands: No inflammation, mass, or stone. Thyroid: Normal. Lymph nodes: None enlarged or abnormal density. Vascular: Negative. Mastoids and visualized paranasal sinuses: Clear. Skeleton: Motion degradation limits evaluation of the available. Degenerate change in the cervical spine Set hypertrophy, osteophytosis and joint space narrowing. Degenerative cystic change in the dense Upper chest: Clear Other: Motion degradation IMPRESSION: 1. No acute intracranial findings. 2. Atrophy and white matter microvascular disease. 3. No acute findings in the neck. 4. Motion degradation limits evaluation of the mandible. No abscess or inflammation identified.  Electronically Signed   By: Genevive Bi M.D.   On: 02/28/2016 08:04   Dg Abdomen Peg Tube Location  Result Date: 03/13/2016 CLINICAL DATA:  Evaluate gastrojejunostomy tube for proper positioning EXAM: ABDOMEN - 1 VIEW COMPARISON:  02/08/2016, 01/03/2016 FINDINGS: Contrast is noted within the proximal stomach with GJ tube balloon noted in the body of the stomach. The jejunostomy component is seen coursing along the expected course the duodenum and into proximal jejunum, tip in the left lower quadrant. Chain sutures are also seen in the left lower quadrant. Cholecystectomy clips are seen in the right upper quadrant. Chronic T11 superior endplate compression. IMPRESSION: GJ tube noted as above described with gastric component in the body of the stomach and tip of jejunal component in the left lower quadrant in the expected course of proximal jejunum. Electronically Signed   By: Tollie Eth M.D.   On: 03/13/2016 17:45   Dg Chest Port 1 View  Result Date: 03/10/2016 CLINICAL DATA:  Cough for several days with left-sided chest pain, initial encounter EXAM: PORTABLE CHEST 1 VIEW COMPARISON:  03/06/2016 FINDINGS: Cardiac shadow is mildly enlarged but stable. Increasing left lower lobe consolidation  is noted with associated effusion. No other focal infiltrate is noted. No bony abnormality is seen. IMPRESSION: Left lower lobe pneumonia with associated effusion. Electronically Signed   By: Alcide Clever M.D.   On: 03/10/2016 14:48   Dg Chest Port 1v Same Day  Result Date: 03/05/2016 CLINICAL DATA:  CHF EXAM: PORTABLE CHEST 1 VIEW COMPARISON:  03/02/2016 FINDINGS: Stable cardiomegaly without definite CHF. Chronic elevation of the right hemidiaphragm. Increased consolidative left lower lobe opacity obscures the left cardiac border and the left hemidiaphragm suspicious for developing left lower lobe collapse/ consolidation. Pneumonia not excluded. No pneumothorax. Trachea is midline. Atherosclerosis changes of  the aorta. Degenerative changes of the spine and shoulders. IMPRESSION: Stable cardiomegaly without definite edema or CHF Increased left lower lobe collapse/consolidation concerning for developing pneumonia Thoracic aortic atherosclerosis Electronically Signed   By: Judie Petit.  Shick M.D.   On: 03/05/2016 14:13     Assessment/Plan   ICD-9-CM ICD-10-CM   1. Cough 786.2 R05   2. Atrial flutter, unspecified type (HCC) 427.32 I48.92   3. Asthma with allergic rhinitis, unspecified asthma severity, uncomplicated 493.00 J45.909   4. CKD (chronic kidney disease) stage 3, GFR 30-59 ml/min 585.3 N18.3   5. Essential hypertension, benign 401.1 I10   6. Type 2 diabetes mellitus with stage 3 chronic kidney disease, without long-term current use of insulin (HCC) 250.40 E11.22    585.3 N18.3   7. Chronic diastolic CHF (congestive heart failure) (HCC) 428.32 I50.32    428.0    8. Gastric outlet obstruction 537.0 K31.1   9. Chronic deep vein thrombosis (DVT) of tibial vein of left lower extremity (HCC) 453.52 I82.542   10. Hypothyroidism due to medication 244.8 E03.2    E980.5    11. PEG (percutaneous endoscopic gastrostomy) status (HCC) V44.1 Z93.1    May try Glucerna for nutritional supplement instead of Boost  Continue current medications as ordered  Follow up with specialists as scheduled  Continue tube feeds as ordered  Follow up with podiatry as scheduled  Continue Home Health/palliative care  Follow up in 1-2 mos for routine visit  Jakyrah Holladay S. Ancil Linsey  [facility] [address] [place], [place] [address] [phone] Cell (Monday-Friday 8 AM - 5 PM) [phone] After 5 PM and follow prompts

## 2016-03-24 NOTE — Patient Outreach (Signed)
[place] [facility]) Care Management  03/24/2016  Shannon [name] 10/01/1923 [id number]     EMMI-HF RED ON EMMI ALERT Day # 8 Date: 03/23/16 Red Alert Reason: "Went to follow up appt? No"    Patient completed PCP f/u appt on today. She had not yet had PCP when she received automated EMMI call on 03/23/16. No further interventions needed at this time.   Plan: RN CM will notify [facility] administrative assistant of case status.   Enzo Montgomery, RN,BSN,CCM [place] Management Telephonic Care Management Coordinator Direct Phone: [phone] Toll Free: [phone] Fax: [phone]

## 2016-03-28 ENCOUNTER — Other Ambulatory Visit: Payer: Self-pay

## 2016-03-28 NOTE — Patient Outreach (Signed)
[place] [facility]) Care Management  03/28/2016  Shannon [name] Sep 16, 1923 [id number]  REFERRAL DATE; 03/28/16 REFERRAL SOURCE:  EMMI heart failure transition program REFERRAL REASON: EMMI heart failure RED ALERT for:  "weight (lbs) - 141 lbs.    Telephone call to patient/ patients caregiver regarding EMMI heart failure RED alert. Unable to reach patient. HIPAA compliant voice message left with call back phone number.   PLAN; RNCM will attempt 2nd telephone call to patient within 1 week.   Quinn Plowman RN,BSN,CCM [facility] Telephonic  [phone]

## 2016-03-29 ENCOUNTER — Telehealth: Payer: Self-pay | Admitting: *Deleted

## 2016-03-29 ENCOUNTER — Ambulatory Visit (INDEPENDENT_AMBULATORY_CARE_PROVIDER_SITE_OTHER): Payer: Medicare Other | Admitting: Orthopaedic Surgery

## 2016-03-29 ENCOUNTER — Ambulatory Visit (INDEPENDENT_AMBULATORY_CARE_PROVIDER_SITE_OTHER): Payer: Medicare Other | Admitting: Internal Medicine

## 2016-03-29 ENCOUNTER — Other Ambulatory Visit: Payer: Self-pay

## 2016-03-29 DIAGNOSIS — I5033 Acute on chronic diastolic (congestive) heart failure: Secondary | ICD-10-CM | POA: Diagnosis not present

## 2016-03-29 DIAGNOSIS — L89610 Pressure ulcer of right heel, unstageable: Secondary | ICD-10-CM | POA: Diagnosis not present

## 2016-03-29 DIAGNOSIS — L89151 Pressure ulcer of sacral region, stage 1: Secondary | ICD-10-CM | POA: Diagnosis not present

## 2016-03-29 DIAGNOSIS — N183 Chronic kidney disease, stage 3 (moderate): Secondary | ICD-10-CM | POA: Diagnosis not present

## 2016-03-29 DIAGNOSIS — I130 Hypertensive heart and chronic kidney disease with heart failure and stage 1 through stage 4 chronic kidney disease, or unspecified chronic kidney disease: Secondary | ICD-10-CM | POA: Diagnosis not present

## 2016-03-29 DIAGNOSIS — E1122 Type 2 diabetes mellitus with diabetic chronic kidney disease: Secondary | ICD-10-CM | POA: Diagnosis not present

## 2016-03-29 DIAGNOSIS — I4892 Unspecified atrial flutter: Secondary | ICD-10-CM

## 2016-03-29 DIAGNOSIS — I824Y2 Acute embolism and thrombosis of unspecified deep veins of left proximal lower extremity: Secondary | ICD-10-CM

## 2016-03-29 DIAGNOSIS — Z5181 Encounter for therapeutic drug level monitoring: Secondary | ICD-10-CM

## 2016-03-29 LAB — POCT INR: INR: 1.3

## 2016-03-29 MED ORDER — AZITHROMYCIN 250 MG PO TABS: ORAL_TABLET | ORAL | 0 refills | Status: DC

## 2016-03-29 NOTE — Telephone Encounter (Signed)
zpak #1 take as directed no rf

## 2016-03-29 NOTE — Patient Outreach (Signed)
[place] [facility]) Care Management  03/29/2016  Shannon [name] December 15, 1923 [id number]    EMMI-HF RED ON EMMI ALERT Day # 11 Date: 03/26/16(Sun.) Red Alert Reason: "Weight: 141 lbs"  Outreach attempt to patient. Spoke with dtr-Shannon [name] whom patient has given consent to speak with. Dtr reports that patient's weight was up the other day to 141 lbs. She was having some SOB, wheezing and swelling as well. She states weight is back down today at 139 lbs and patient feeling and doing better. HH RN currently in the home and assessing patient at present. RN CM questioned dtr as to rather or not they decided to go with palliative care services. Dtr confirmed that they did choose palliative care services and they are in place and following patient as well as [facility] agency. Advised that RN CM would have automated EMMI alls deactivated since patient getting palliative care in home support. Dtr in agreement with stopping calls as she has in home support to manage patient and was appreciative of call.     Plan: RN CM will notify [facility] administrative assistant of case closure status and to deactivate automated EMMI calls.    Enzo Montgomery, RN,BSN,CCM [place] Management Telephonic Care Management Coordinator Direct Phone: [phone] Toll Free: [phone] Fax: [phone]

## 2016-03-29 NOTE — Telephone Encounter (Signed)
Linda with [facility] called and stated that patient has Head and Chest Congestion, coughing up Yellow/Green sputum, No Fever, Wheezes and has a history of multiple times with pneumonia. Nurse was wondering if you wanted to go ahead and start an antibiotic. Please Advise.

## 2016-03-29 NOTE — Telephone Encounter (Signed)
Patient caregiver notified and agreed. Rx faxed to pharmacy.

## 2016-03-30 ENCOUNTER — Telehealth: Payer: Self-pay

## 2016-03-30 MED ORDER — DOXYCYCLINE HYCLATE 100 MG PO TABS: 100.0000 mg | ORAL_TABLET | Freq: Two times a day (BID) | ORAL | 0 refills | Status: DC

## 2016-03-30 NOTE — Telephone Encounter (Signed)
[facility] in [place] called with concerns about Zpak and Amiodarone increasing T wave prolongation. Please advise if ok to fill

## 2016-03-30 NOTE — Telephone Encounter (Signed)
Pharmacy called back stating they need a response to previous question. Patient's daughter is needing medication today.  Dr.Carter is out of office and has not responded, I will forward to in office provider for response

## 2016-03-30 NOTE — Telephone Encounter (Signed)
Lets change her to doxycyline 100 mg by mouth twice daily for 1 week- will need INR checked on Tuesday 04/04/16 by coumadin clinic

## 2016-03-31 DIAGNOSIS — I130 Hypertensive heart and chronic kidney disease with heart failure and stage 1 through stage 4 chronic kidney disease, or unspecified chronic kidney disease: Secondary | ICD-10-CM | POA: Diagnosis not present

## 2016-03-31 DIAGNOSIS — N183 Chronic kidney disease, stage 3 (moderate): Secondary | ICD-10-CM | POA: Diagnosis not present

## 2016-03-31 DIAGNOSIS — L89151 Pressure ulcer of sacral region, stage 1: Secondary | ICD-10-CM | POA: Diagnosis not present

## 2016-03-31 DIAGNOSIS — I5033 Acute on chronic diastolic (congestive) heart failure: Secondary | ICD-10-CM | POA: Diagnosis not present

## 2016-03-31 DIAGNOSIS — E1122 Type 2 diabetes mellitus with diabetic chronic kidney disease: Secondary | ICD-10-CM | POA: Diagnosis not present

## 2016-03-31 DIAGNOSIS — L89610 Pressure ulcer of right heel, unstageable: Secondary | ICD-10-CM | POA: Diagnosis not present

## 2016-03-31 NOTE — Telephone Encounter (Signed)
Spoke with Shannon [name], pharmacy called to inform her rx is ready and she plans on picking it up today. Patient is scheduled to have a PT/INR on Thursday at the [facility]

## 2016-04-02 ENCOUNTER — Other Ambulatory Visit: Payer: Self-pay | Admitting: Internal Medicine

## 2016-04-03 NOTE — Telephone Encounter (Signed)
Needs to be checked sooner due to being on antibiotics please see if she can get in on Tuesday 1/2

## 2016-04-04 ENCOUNTER — Telehealth: Payer: Self-pay | Admitting: *Deleted

## 2016-04-04 DIAGNOSIS — E1122 Type 2 diabetes mellitus with diabetic chronic kidney disease: Secondary | ICD-10-CM | POA: Diagnosis not present

## 2016-04-04 DIAGNOSIS — I5033 Acute on chronic diastolic (congestive) heart failure: Secondary | ICD-10-CM | POA: Diagnosis not present

## 2016-04-04 DIAGNOSIS — I130 Hypertensive heart and chronic kidney disease with heart failure and stage 1 through stage 4 chronic kidney disease, or unspecified chronic kidney disease: Secondary | ICD-10-CM | POA: Diagnosis not present

## 2016-04-04 DIAGNOSIS — N183 Chronic kidney disease, stage 3 (moderate): Secondary | ICD-10-CM | POA: Diagnosis not present

## 2016-04-04 DIAGNOSIS — L89610 Pressure ulcer of right heel, unstageable: Secondary | ICD-10-CM | POA: Diagnosis not present

## 2016-04-04 DIAGNOSIS — L89151 Pressure ulcer of sacral region, stage 1: Secondary | ICD-10-CM | POA: Diagnosis not present

## 2016-04-04 NOTE — Telephone Encounter (Signed)
Left message for patient to call the office

## 2016-04-04 NOTE — Telephone Encounter (Signed)
What kind of "foam"? If it is for wound prevention or barrier protection, ok to use prn

## 2016-04-04 NOTE — Telephone Encounter (Signed)
Shannon [name] with [facility] called and requested a standing order for foam to use as needed when she has break down of her skin. Please Advise.

## 2016-04-04 NOTE — Telephone Encounter (Signed)
Patient's daughter called back and we discussed getting patient's INR checked. Shannon [name] informed me that someone from the coumadin clinic came out to patient's house to do INR check. Patient has therapy today and a doctor's appointment tomorrow, so Shannon [name] does not what time to have someone from coumadin clinic out to patient's house today or tomorrow.

## 2016-04-04 NOTE — Telephone Encounter (Signed)
Aware of this however since pt is on an antibiotic that can increase INR therefore increases her risk of bleeding it is recommended that she get checked sooner. Please just make sure she is aware of this risk and if she has any signs of bleeding or mental status changes they go to the ER immediately

## 2016-04-04 NOTE — Telephone Encounter (Signed)
noted 

## 2016-04-04 NOTE — Telephone Encounter (Signed)
I spoke with [name] about setting up an INR check for today. [name] stated that patient will be going to coumadin clinic on Thursday and she would rather wait until then. She stated that the coumadin clinic has been following patient closely in order to get INR up due to it being too low.

## 2016-04-05 ENCOUNTER — Ambulatory Visit (INDEPENDENT_AMBULATORY_CARE_PROVIDER_SITE_OTHER): Payer: Medicare Other | Admitting: Podiatry

## 2016-04-05 DIAGNOSIS — M659 Synovitis and tenosynovitis, unspecified: Secondary | ICD-10-CM

## 2016-04-05 DIAGNOSIS — M79609 Pain in unspecified limb: Secondary | ICD-10-CM | POA: Diagnosis not present

## 2016-04-05 DIAGNOSIS — M25572 Pain in left ankle and joints of left foot: Secondary | ICD-10-CM | POA: Diagnosis not present

## 2016-04-05 DIAGNOSIS — E0843 Diabetes mellitus due to underlying condition with diabetic autonomic (poly)neuropathy: Secondary | ICD-10-CM | POA: Diagnosis not present

## 2016-04-05 DIAGNOSIS — B351 Tinea unguium: Secondary | ICD-10-CM

## 2016-04-05 DIAGNOSIS — M7752 Other enthesopathy of left foot: Secondary | ICD-10-CM

## 2016-04-05 DIAGNOSIS — L603 Nail dystrophy: Secondary | ICD-10-CM

## 2016-04-05 DIAGNOSIS — E11621 Type 2 diabetes mellitus with foot ulcer: Secondary | ICD-10-CM

## 2016-04-05 DIAGNOSIS — L608 Other nail disorders: Secondary | ICD-10-CM | POA: Diagnosis not present

## 2016-04-05 DIAGNOSIS — L97521 Non-pressure chronic ulcer of other part of left foot limited to breakdown of skin: Secondary | ICD-10-CM

## 2016-04-05 DIAGNOSIS — M65972 Unspecified synovitis and tenosynovitis, left ankle and foot: Secondary | ICD-10-CM

## 2016-04-05 NOTE — Telephone Encounter (Signed)
Spoke with [name] and she stated that it is Radiographer, therapeutic. Verbal order given.

## 2016-04-05 NOTE — Telephone Encounter (Signed)
Noted  

## 2016-04-06 ENCOUNTER — Ambulatory Visit (INDEPENDENT_AMBULATORY_CARE_PROVIDER_SITE_OTHER): Payer: Medicare Other | Admitting: Pharmacist

## 2016-04-06 DIAGNOSIS — E1122 Type 2 diabetes mellitus with diabetic chronic kidney disease: Secondary | ICD-10-CM | POA: Diagnosis not present

## 2016-04-06 DIAGNOSIS — I4892 Unspecified atrial flutter: Secondary | ICD-10-CM

## 2016-04-06 DIAGNOSIS — N183 Chronic kidney disease, stage 3 (moderate): Secondary | ICD-10-CM | POA: Diagnosis not present

## 2016-04-06 DIAGNOSIS — I130 Hypertensive heart and chronic kidney disease with heart failure and stage 1 through stage 4 chronic kidney disease, or unspecified chronic kidney disease: Secondary | ICD-10-CM | POA: Diagnosis not present

## 2016-04-06 DIAGNOSIS — L89610 Pressure ulcer of right heel, unstageable: Secondary | ICD-10-CM | POA: Diagnosis not present

## 2016-04-06 DIAGNOSIS — I5033 Acute on chronic diastolic (congestive) heart failure: Secondary | ICD-10-CM | POA: Diagnosis not present

## 2016-04-06 DIAGNOSIS — Z5181 Encounter for therapeutic drug level monitoring: Secondary | ICD-10-CM

## 2016-04-06 DIAGNOSIS — L89151 Pressure ulcer of sacral region, stage 1: Secondary | ICD-10-CM | POA: Diagnosis not present

## 2016-04-06 DIAGNOSIS — I824Y2 Acute embolism and thrombosis of unspecified deep veins of left proximal lower extremity: Secondary | ICD-10-CM

## 2016-04-06 LAB — POCT INR: INR: 1.4

## 2016-04-08 ENCOUNTER — Other Ambulatory Visit: Payer: Self-pay | Admitting: Internal Medicine

## 2016-04-10 ENCOUNTER — Telehealth: Payer: Self-pay | Admitting: Cardiology

## 2016-04-10 DIAGNOSIS — L89151 Pressure ulcer of sacral region, stage 1: Secondary | ICD-10-CM | POA: Diagnosis not present

## 2016-04-10 DIAGNOSIS — I130 Hypertensive heart and chronic kidney disease with heart failure and stage 1 through stage 4 chronic kidney disease, or unspecified chronic kidney disease: Secondary | ICD-10-CM | POA: Diagnosis not present

## 2016-04-10 DIAGNOSIS — I5033 Acute on chronic diastolic (congestive) heart failure: Secondary | ICD-10-CM | POA: Diagnosis not present

## 2016-04-10 DIAGNOSIS — L89610 Pressure ulcer of right heel, unstageable: Secondary | ICD-10-CM | POA: Diagnosis not present

## 2016-04-10 DIAGNOSIS — E1122 Type 2 diabetes mellitus with diabetic chronic kidney disease: Secondary | ICD-10-CM | POA: Diagnosis not present

## 2016-04-10 DIAGNOSIS — N183 Chronic kidney disease, stage 3 (moderate): Secondary | ICD-10-CM | POA: Diagnosis not present

## 2016-04-10 NOTE — Telephone Encounter (Signed)
New message    1. What dental office are you calling from?  [facility]  2. What is your office phone and fax number?   Fax [phone]  3. What type of procedure is the patient having performed? Extraction of 6 teeth  What date is procedure scheduled? 04-20-16 4. What is your question (ex. Antibiotics prior to procedure, holding medication-we need to know how long dentist wants pt to hold med)?  Pt will stay on coumadin because of DVT in leg.  Is this ok for her to stay on coumadin to have 6 teeth extracted?

## 2016-04-10 NOTE — Telephone Encounter (Signed)
Complicated patient - her CHADS2 score is 6 (CHF, HTN, DM, age, and CVA), and pt had a DVT on 01/24/16. Doppler report reads as follows: "evidence of deep vein thrombosis involving the left lower extremity. Deep vein thrombosis involves the popliteal vein, peroneal, and posterior tibial veins of the left lower extremity."  Pt ideally needs to hold anticoag therapy unless she is seen by a dental surgeon who is comfortable performing 6 dental extractions with pt on therapeutic Coumadin. Otherwise, would usually recommend bridging pt with Lovenox given her high risk. However, pt is 83 years old and also has a CrCl of 20 - Lovenox PK/PD data is limited in this patient population.  Will defer to Dr Meda Coffee for 2 reasons - to see if she is comfortable with pt interrupting anticoagulation therapy at all within 6 months of DVT treatment, and for assessment regarding need for bridging.

## 2016-04-10 NOTE — Telephone Encounter (Signed)
Will route clearance for coumadin clinic and  Dr Meda Coffee to review and advise on.  Will follow-up with Oral Surgeons office accordingly, once recommendations are provided.

## 2016-04-12 ENCOUNTER — Other Ambulatory Visit: Payer: Self-pay | Admitting: Cardiology

## 2016-04-13 ENCOUNTER — Ambulatory Visit (INDEPENDENT_AMBULATORY_CARE_PROVIDER_SITE_OTHER): Payer: Medicare Other | Admitting: Pulmonary Disease

## 2016-04-13 ENCOUNTER — Encounter: Payer: Self-pay | Admitting: Pulmonary Disease

## 2016-04-13 ENCOUNTER — Ambulatory Visit (INDEPENDENT_AMBULATORY_CARE_PROVIDER_SITE_OTHER): Payer: Medicare Other | Admitting: Cardiovascular Disease

## 2016-04-13 ENCOUNTER — Ambulatory Visit (INDEPENDENT_AMBULATORY_CARE_PROVIDER_SITE_OTHER)
Admission: RE | Admit: 2016-04-13 | Discharge: 2016-04-13 | Disposition: A | Discharge status: Home / Self Care | Payer: Medicare Other | Source: Ambulatory Visit | Attending: Pulmonary Disease | Admitting: Pulmonary Disease

## 2016-04-13 VITALS — BP 132/80 | HR 105 | Ht 66.0 in | Wt 143.2 lb

## 2016-04-13 DIAGNOSIS — Z5181 Encounter for therapeutic drug level monitoring: Secondary | ICD-10-CM

## 2016-04-13 DIAGNOSIS — E1122 Type 2 diabetes mellitus with diabetic chronic kidney disease: Secondary | ICD-10-CM | POA: Diagnosis not present

## 2016-04-13 DIAGNOSIS — L89151 Pressure ulcer of sacral region, stage 1: Secondary | ICD-10-CM | POA: Diagnosis not present

## 2016-04-13 DIAGNOSIS — I4892 Unspecified atrial flutter: Secondary | ICD-10-CM

## 2016-04-13 DIAGNOSIS — L89610 Pressure ulcer of right heel, unstageable: Secondary | ICD-10-CM | POA: Diagnosis not present

## 2016-04-13 DIAGNOSIS — I824Y2 Acute embolism and thrombosis of unspecified deep veins of left proximal lower extremity: Secondary | ICD-10-CM

## 2016-04-13 DIAGNOSIS — R0600 Dyspnea, unspecified: Secondary | ICD-10-CM

## 2016-04-13 DIAGNOSIS — I130 Hypertensive heart and chronic kidney disease with heart failure and stage 1 through stage 4 chronic kidney disease, or unspecified chronic kidney disease: Secondary | ICD-10-CM | POA: Diagnosis not present

## 2016-04-13 DIAGNOSIS — R05 Cough: Secondary | ICD-10-CM | POA: Diagnosis not present

## 2016-04-13 DIAGNOSIS — N183 Chronic kidney disease, stage 3 (moderate): Secondary | ICD-10-CM | POA: Diagnosis not present

## 2016-04-13 DIAGNOSIS — I5033 Acute on chronic diastolic (congestive) heart failure: Secondary | ICD-10-CM | POA: Diagnosis not present

## 2016-04-13 LAB — POCT INR: INR: 2

## 2016-04-13 NOTE — Telephone Encounter (Signed)
Great thank you Jinny Blossom!

## 2016-04-13 NOTE — Progress Notes (Signed)
Shannon [name]    [id number]    03-01-1924  Primary Care Physician:Carter, Maxine Glenn, DO  Referring Physician: Kirt Boys, DO [address] [place], [place] [address]  Chief complaint:  Consult for evaluation of dyspnea  HPI: [name] is a 83 year old with complicated medical history including heart failure, chronic kidney disease, diabetes, dysphagia with recurrent aspiration status post PEG tube placement, gastric outlet obstruction. She has been hospitalized multiple times over the past yearwith bradycardia, weakness, rapid atrial flutter, left lower extremity DVT,sepsisdue to UTI. Her last admission was in December 2017 for acute on chronic heart failure. At that time palliative care was consulted. She was discharged home with a referral for home palliative care. She is currently DNR/DNI.   She returns to clinic today with chief complaints of dyspnea on exertion with occasional wheeze. She has occasional cough with mucus production. He denies any fevers, chills. She feels that she is slowly improving. She is currently maintained on DuoNeb which she takes twice daily. She is also on Xopenex neb PRN. She has been prescribed Advair but is not taking it on a regular basis. She is a never smoker with no known exposures.  Outpatient Encounter Prescriptions as of 04/13/2016  Medication Sig  . acetaminophen (TYLENOL) 325 MG tablet Take 650 mg by mouth 2 (two) times daily as needed for mild pain.   [name] AMINO ACIDS-PROTEIN HYDROLYS PO Take 30 mLs by mouth 2 (two) times daily.  [name] amiodarone (PACERONE) 200 MG tablet Take 200 mg by mouth daily.  [name] atorvastatin (LIPITOR) 10 MG tablet Take 10 mg by mouth daily.  [name] b complex vitamins tablet Take 1 tablet by mouth daily.   . benzonatate (TESSALON) 200 MG capsule Take 1 capsule (200 mg total) by mouth 3 (three) times daily as needed for cough.  . bisacodyl (DULCOLAX) 10 MG suppository Place 1 suppository (10 mg total) rectally daily as needed for  moderate constipation.  . calcium-vitamin D (OSCAL WITH D) 500-200 MG-UNIT per tablet Take 1 tablet by mouth daily with breakfast.   . cetirizine (ZYRTEC) 10 MG tablet Take 10 mg by mouth daily as needed for allergies or rhinitis.  . chlorhexidine (PERIDEX) 0.12 % solution Use as directed 15 mLs in the mouth or throat 2 (two) times daily.  [name] diltiazem (CARDIZEM CD) 120 MG 24 hr capsule Take 1 capsule (120 mg total) by mouth daily.  . feeding supplement (BOOST / RESOURCE BREEZE) LIQD Take 1 Container by mouth daily.  . fluticasone (FLONASE) 50 MCG/ACT nasal spray Place 2 sprays into both nostrils daily.  . furosemide (LASIX) 40 MG tablet Take 1 tablet (40 mg total) by mouth daily.  [name] gabapentin (NEURONTIN) 300 MG capsule Take 1 capsule (300 mg total) by mouth 3 (three) times daily.  . hydrALAZINE (APRESOLINE) 50 MG tablet Take 1 tablet (50 mg total) by mouth 3 (three) times daily.  [name] HYDROcodone-acetaminophen (NORCO) 5-325 MG tablet Take 1 tablet by mouth every 6 (six) hours as needed for moderate pain.  [name] ipratropium-albuterol (DUONEB) 0.5-2.5 (3) MG/3ML SOLN Take 3 mLs by nebulization every 6 (six) hours as needed (sob). Use 3 times daily x 4 days then every 6 hours as needed.  . isosorbide mononitrate (IMDUR) 30 MG 24 hr tablet TAKE 1 TABLET (30 MG TOTAL) BY MOUTH DAILY.  [name] latanoprost (XALATAN) 0.005 % ophthalmic solution Place 1 drop into both eyes at bedtime.  . levalbuterol (XOPENEX HFA) 45 MCG/ACT inhaler Inhale 2  puffs into the lungs every 6 (six) hours as needed for wheezing or shortness of breath.  . levothyroxine (SYNTHROID, LEVOTHROID) 137 MCG tablet TAKE 1 TABLET (137 MCG TOTAL) BY MOUTH DAILY BEFORE BREAKFAST.  . Maltodextrin-Xanthan Gum (RESOURCE THICKENUP CLEAR) POWD Take 120 g by mouth as needed (use to thicken liquids.).  [name] Multiple Vitamin (MULTIVITAMIN WITH MINERALS) TABS tablet Take 1 tablet by mouth daily.  . Nutritional Supplements (FEEDING SUPPLEMENT, OSMOLITE 1.5 CAL,) LIQD  Place 1,000 mLs into feeding tube daily.  . pantoprazole (PROTONIX) 40 MG tablet TAKE 1 TABLET BY MOUTH TWICE A DAY  . Polyethyl Glycol-Propyl Glycol 0.4-0.3 % SOLN Place 1 drop into both eyes 4 (four) times daily.   . polyethylene glycol powder (GLYCOLAX/MIRALAX) powder MIX 17GRAMS IN 8 OUNCES OF LIQUID AND DRINK TWICE DAILY AS NEEDED  . RESTASIS 0.05 % ophthalmic emulsion USE 1 DROP INTO BOTH EYES TWICE DAILY  . sertraline (ZOLOFT) 100 MG tablet TAKE ONE TABLET BY MOUTH ONCE DAILY FOR DEPRESSION  . traMADol (ULTRAM) 50 MG tablet Take 50 mg by mouth daily as needed for moderate pain.   [name] warfarin (COUMADIN) 2.5 MG tablet Take as directed by [facility]. (Patient taking differently: Take 2.5 mg by mouth daily. Take as directed by [facility].)  . Water For Irrigation, Sterile (FREE WATER) SOLN Place 230 mLs into feeding tube 3 (three) times daily.  [name] ADVAIR DISKUS 250-50 MCG/DOSE AEPB INHALE 1 PUFF INTO THE LUNGS 2 (TWO) TIMES DAILY AS NEEDED FOR SHORTNESS OF BREATH  . [DISCONTINUED] doxycycline (VIBRA-TABS) 100 MG tablet Take 1 tablet (100 mg total) by mouth 2 (two) times daily. (Patient not taking: Reported on 04/13/2016)   No facility-administered encounter medications on file as of 04/13/2016.     Allergies as of 04/13/2016 - Review Complete 04/13/2016  Allergen Reaction Noted  . Penicillins Itching 02/12/2016    Past Medical History:  Diagnosis Date  . Anemia    previous blood transfusions  . Arthritis    "all over"  . Asthma   . Bradycardia    requiring previous d/c of BB and reduction of amiodarone  . CAD (coronary artery disease)    nonobstructive per notes  . Chronic diastolic CHF (congestive heart failure) (HCC)   . CKD (chronic kidney disease), stage III   . Complication of blood transfusion    "got the wrong blood type at [facility] in ~ 2015; no adverse reaction that we are aware of"/daughter, [name] (01/27/2016)  . COPD (chronic obstructive pulmonary disease)  (HCC)   . Depression    "light case"  . DVT (deep venous thrombosis) (HCC) 01/2016   a. LLE DVT 01/2016 - switched from Eliquis to Coumadin.  . Gastric stenosis    a. s/p stomach tube  . GERD (gastroesophageal reflux disease)   . History of blood transfusion    "several" (01/27/2016)  . History of stomach ulcers   . Hyperlipidemia   . Hypertension   . Hypothyroidism   . Paraesophageal hernia   . Perforated gastric ulcer (HCC)   . Seasonal allergies   . SIADH (syndrome of inappropriate ADH production) (HCC)    [name] 01/10/2015  . Small bowel obstruction    "I don't know how many" (01/11/2015)  . Stroke (HCC)    "light one"  . Type II diabetes mellitus (HCC)    "related to prednisone use  for > 20 yr; once predinose stopped; no more DM RX" (01/27/2016)  . Ventral hernia with bowel obstruction  Past Surgical History:  Procedure Laterality Date  . CATARACT EXTRACTION W/ INTRAOCULAR LENS  IMPLANT, BILATERAL    . CHOLECYSTECTOMY OPEN    . COLECTOMY    . ESOPHAGOGASTRODUODENOSCOPY N/A 01/19/2014   Procedure: ESOPHAGOGASTRODUODENOSCOPY (EGD);  Surgeon: Hilarie Fredrickson, MD;  Location: [facility];  Service: Endoscopy;  Laterality: N/A;  . ESOPHAGOGASTRODUODENOSCOPY N/A 01/20/2014   Procedure: ESOPHAGOGASTRODUODENOSCOPY (EGD);  Surgeon: Hilarie Fredrickson, MD;  Location: [facility];  Service: Endoscopy;  Laterality: N/A;  . ESOPHAGOGASTRODUODENOSCOPY N/A 03/19/2014   Procedure: ESOPHAGOGASTRODUODENOSCOPY (EGD);  Surgeon: Rachael Fee, MD;  Location: [facility];  Service: Endoscopy;  Laterality: N/A;  . ESOPHAGOGASTRODUODENOSCOPY N/A 07/08/2015   Procedure: ESOPHAGOGASTRODUODENOSCOPY (EGD);  Surgeon: Sherrilyn Rist, MD;  Location: [facility] ENDOSCOPY;  Service: Endoscopy;  Laterality: N/A;  . ESOPHAGOGASTRODUODENOSCOPY (EGD) WITH PROPOFOL N/A 09/15/2015   Procedure: ESOPHAGOGASTRODUODENOSCOPY (EGD) WITH PROPOFOL;  Surgeon: Ruffin Frederick, MD;  Location: [facility] ENDOSCOPY;  Service:  Gastroenterology;  Laterality: N/A;  . GASTROJEJUNOSTOMY     hx/notes 01/10/2015  . GASTROJEJUNOSTOMY N/A 09/23/2015   Procedure: OPEN GASTROJEJUNOSTOMY TUBE PLACEMENT;  Surgeon: De Blanch Kinsinger, MD;  Location: [facility] ORS;  Service: General;  Laterality: N/A;  . GLAUCOMA SURGERY Bilateral   . HERNIA REPAIR  2015  . IR GENERIC HISTORICAL  01/07/2016   IR GJ TUBE CHANGE 01/07/2016 Malachy Moan, MD [facility]  . IR GENERIC HISTORICAL  01/27/2016   IR MECH REMOV OBSTRUC MAT ANY COLON TUBE W/FLUORO 01/27/2016 Richarda Overlie, MD [facility]  . IR GENERIC HISTORICAL  02/07/2016   IR PATIENT EVAL TECH 0-60 MINS Darrell K Allred, PA-C [facility]  . IR GENERIC HISTORICAL  02/08/2016   IR GJ TUBE CHANGE 02/08/2016 Berdine Dance, MD [facility]  . LAPAROTOMY N/A 01/20/2015   Procedure: EXPLORATORY LAPAROTOMY;  Surgeon: Abigail Miyamoto, MD;  Location: [facility] OR;  Service: General;  Laterality: N/A;  . LYSIS OF ADHESION N/A 01/20/2015   Procedure: LYSIS OF ADHESIONS < 1 HOUR;  Surgeon: Abigail Miyamoto, MD;  Location: [facility] OR;  Service: General;  Laterality: N/A;  . TONSILLECTOMY    . TUBAL LIGATION    . VENTRAL HERNIA REPAIR  2015   incarcerated ventral hernia ([facility] 09/2013)/notes 01/10/2015    Family History  Problem Relation Age of Onset  . Stroke Mother   . Hypertension Mother   . Diabetes Brother   . Heart attack Neg Hx     Social History   Social History  . Marital status: Widowed    Spouse name: N/A  . Number of children: 3  . Years of education: N/A   Occupational History  . Not on file.   Social History Main Topics  . Smoking status: Never Smoker  . Smokeless tobacco: Former Neurosurgeon    Types: Snuff     Comment: "used snuff in my younger days"  . Alcohol use No  . Drug use: No  . Sexual activity: No   Other Topics Concern  . Not on file   Social History Narrative   Married in [place], lives in one story house with 2 people and no pets   Occupation: ?   Has a living Will,  [place], and doesn't have DNR   Was living with Husband (in frail health) in [place] until 09/2013.  After her [facility] admission they both came to live with daughter in [place]   Review of systems: Review of Systems  Constitutional: Negative for fever and chills.  HENT: Negative.   Eyes:  Negative for blurred vision.  Respiratory: as per HPI  Cardiovascular: Negative for chest pain and palpitations.  Gastrointestinal: Negative for vomiting, diarrhea, blood per rectum. Genitourinary: Negative for dysuria, urgency, frequency and hematuria.  Musculoskeletal: Negative for myalgias, back pain and joint pain.  Skin: Negative for itching and rash.  Neurological: Negative for dizziness, tremors, focal weakness, seizures and loss of consciousness.  Endo/Heme/Allergies: Negative for environmental allergies.  Psychiatric/Behavioral: Negative for depression, suicidal ideas and hallucinations.  All other systems reviewed and are negative.  Physical Exam: Blood pressure 132/80, pulse (!) 105, height 5\' 6"  (1.676 m), weight 143 lb 3.2 oz (65 kg), SpO2 100 %. Gen:      No acute distress HEENT:  EOMI, sclera anicteric Neck:     No masses; no thyromegaly Lungs:    Clear to auscultation bilaterally; normal respiratory effort CV:         Regular rate and rhythm; no murmurs Abd:      + bowel sounds; soft, non-tender; no palpable masses, no distension Ext:    No edema; adequate peripheral perfusion Skin:      Warm and dry; no rash Neuro: alert and oriented x 3 Psych: normal mood and affect  Data Reviewed: CXR 03/10/16- left lower lobe infiltrate with associated effusion. Images reviewed  Echo 01/28/16 Normal LV systolic function; grade 1 diastolic dysfunction; mild AI; mild MR; moderate LAE.  Assessment:  Evaluation of dyspnea I suspect her dyspnea is probably multifactorial from heart failure, chronic kidney disease, volume overload, recent left lower lobe pneumonia. She has a diagnosis of COPD and  asthma but she is a never smoker and does not have symptoms cough, mucus, wheezing. She was unable to complete an FENO in the office today and I don't believe that she'll be able to do spirometry or PFTs as well due to problems with coordination.  I will get a chest x-ray today to ensure resolution of the recent left lower lobe infiltrate. She'll also continue on the nebulizer and xopenex. She was instructed to increase her duonebs to 4 times a day on the days that she has excessive symptoms of dyspnea.  Return in 3 months  Plan/Recommendations: - CXR - Continue Duonebs and xopenex inhaler.   Chilton Greathouse MD [facility] Pager [phone] 04/13/2016, 2:16 PM  CC: Kirt Boys, DO

## 2016-04-13 NOTE — Patient Instructions (Signed)
We will schedule you for chest x-ray today to ensure resolution of the left lower lobe infiltrate Continue your duo nebs. You can use the nebulizer up to 6 times per day as needed for shortness of breath Use the Xopenex inhaler as a rescue medication for the times when you're not able to access the nebulizer machine.  Return to clinic in 3 months.

## 2016-04-13 NOTE — Telephone Encounter (Signed)
Pt has found an oral surgeon - Dr Keane Police - who is willing to extract 6 teeth while pt remains on Coumadin. Will fax over INR on 1/17 the day prior to procedure.

## 2016-04-13 NOTE — Telephone Encounter (Signed)
I agree, lets try to find a dentist who is willing to perform surgery while on coumadin, otherwise hold it with no bridging.

## 2016-04-13 NOTE — Telephone Encounter (Signed)
Rx refill sent to pharmacy. 

## 2016-04-14 ENCOUNTER — Telehealth: Payer: Self-pay

## 2016-04-14 DIAGNOSIS — I5033 Acute on chronic diastolic (congestive) heart failure: Secondary | ICD-10-CM | POA: Diagnosis not present

## 2016-04-14 DIAGNOSIS — L89610 Pressure ulcer of right heel, unstageable: Secondary | ICD-10-CM | POA: Diagnosis not present

## 2016-04-14 DIAGNOSIS — I130 Hypertensive heart and chronic kidney disease with heart failure and stage 1 through stage 4 chronic kidney disease, or unspecified chronic kidney disease: Secondary | ICD-10-CM | POA: Diagnosis not present

## 2016-04-14 DIAGNOSIS — L89151 Pressure ulcer of sacral region, stage 1: Secondary | ICD-10-CM | POA: Diagnosis not present

## 2016-04-14 DIAGNOSIS — N183 Chronic kidney disease, stage 3 (moderate): Secondary | ICD-10-CM | POA: Diagnosis not present

## 2016-04-14 DIAGNOSIS — E1122 Type 2 diabetes mellitus with diabetic chronic kidney disease: Secondary | ICD-10-CM | POA: Diagnosis not present

## 2016-04-14 NOTE — Telephone Encounter (Signed)
Message left on clinical intake voicemail:   Stacie with [name] called to request verbal orders for Physical Therapy.   Last OV 03/24/17   Per [facility] standing order, verbal order given. Message will be sent to patient's provider as a FYI.

## 2016-04-16 MED ORDER — BETAMETHASONE SOD PHOS & ACET 6 (3-3) MG/ML IJ SUSP: 3.0000 mg | Freq: Once | INTRAMUSCULAR | Status: DC

## 2016-04-16 NOTE — Progress Notes (Signed)
SUBJECTIVE Patient with a history of diabetes mellitus presents to office today complaining of elongated, thickened nails. Pain while ambulating in shoes. Patient is unable to trim their own nails.  Patient also complains of painful callus lesion an ulcer to the second digit left foot. Patient also has a new complaint today of ankle pain to the left lower extremity. Patient states the pains been ongoing with ambulation for a while now. No alleviating factors. Patient presents today for further treatment and evaluation  OBJECTIVE General Patient is awake, alert, and oriented x 3 and in no acute distress. Derm ulcer noted to the second digit left foot distal tuft. There is no purulence or malodor. Minimal drainage.  Skin is dry and supple bilateral. Negative open lesions or macerations. Remaining integument unremarkable. Nails are tender, long, thickened and dystrophic with subungual debris, consistent with onychomycosis, 1-5 bilateral. No signs of infection noted. Vasc  DP and PT pedal pulses palpable bilaterally. Temperature gradient within normal limits.  Neuro Epicritic and protective threshold sensation diminished bilaterally.  Musculoskeletal Exam pain on palpation noted to the anterior medial and lateral aspects of the patient's left ankle joint. Edema noted. No symptomatic pedal deformities noted bilateral. Muscular strength within normal limits.  ASSESSMENT 1. Diabetes Mellitus w/ peripheral neuropathy 2. Onychomycosis of nail due to dermatophyte bilateral 3. Pain in foot bilateral 4. Ulcer second digit left foot secondary to diabetes mellitus 5. Ankle joint synovitis with capsulitis left ankle  PLAN OF CARE 1. Patient evaluated today. 2. Instructed to maintain good pedal hygiene and foot care. Stressed importance of controlling blood sugar.  3. Mechanical debridement of nails 1-5 bilaterally performed using a nail nipper. Filed with dremel without incident.  4. Injection of 0.5 mL  Celestone Soluspan injected in the patient's left ankle joint  5. Return to clinic in 3 mos.    Edrick Kins, DPM

## 2016-04-18 DIAGNOSIS — I130 Hypertensive heart and chronic kidney disease with heart failure and stage 1 through stage 4 chronic kidney disease, or unspecified chronic kidney disease: Secondary | ICD-10-CM | POA: Diagnosis not present

## 2016-04-18 DIAGNOSIS — L89610 Pressure ulcer of right heel, unstageable: Secondary | ICD-10-CM | POA: Diagnosis not present

## 2016-04-18 DIAGNOSIS — E1122 Type 2 diabetes mellitus with diabetic chronic kidney disease: Secondary | ICD-10-CM | POA: Diagnosis not present

## 2016-04-18 DIAGNOSIS — L89151 Pressure ulcer of sacral region, stage 1: Secondary | ICD-10-CM | POA: Diagnosis not present

## 2016-04-18 DIAGNOSIS — I5033 Acute on chronic diastolic (congestive) heart failure: Secondary | ICD-10-CM | POA: Diagnosis not present

## 2016-04-18 DIAGNOSIS — N183 Chronic kidney disease, stage 3 (moderate): Secondary | ICD-10-CM | POA: Diagnosis not present

## 2016-04-19 ENCOUNTER — Encounter: Payer: Self-pay | Admitting: Internal Medicine

## 2016-04-19 DIAGNOSIS — N390 Urinary tract infection, site not specified: Secondary | ICD-10-CM | POA: Diagnosis not present

## 2016-04-19 DIAGNOSIS — N183 Chronic kidney disease, stage 3 (moderate): Secondary | ICD-10-CM | POA: Diagnosis not present

## 2016-04-19 DIAGNOSIS — I130 Hypertensive heart and chronic kidney disease with heart failure and stage 1 through stage 4 chronic kidney disease, or unspecified chronic kidney disease: Secondary | ICD-10-CM | POA: Diagnosis not present

## 2016-04-19 DIAGNOSIS — L89610 Pressure ulcer of right heel, unstageable: Secondary | ICD-10-CM | POA: Diagnosis not present

## 2016-04-19 DIAGNOSIS — L89151 Pressure ulcer of sacral region, stage 1: Secondary | ICD-10-CM | POA: Diagnosis not present

## 2016-04-19 DIAGNOSIS — I5033 Acute on chronic diastolic (congestive) heart failure: Secondary | ICD-10-CM | POA: Diagnosis not present

## 2016-04-19 DIAGNOSIS — E1122 Type 2 diabetes mellitus with diabetic chronic kidney disease: Secondary | ICD-10-CM | POA: Diagnosis not present

## 2016-04-19 LAB — POCT INR: INR: 1.9

## 2016-04-21 ENCOUNTER — Ambulatory Visit (INDEPENDENT_AMBULATORY_CARE_PROVIDER_SITE_OTHER): Payer: Medicare Other | Admitting: Cardiology

## 2016-04-21 DIAGNOSIS — I824Y2 Acute embolism and thrombosis of unspecified deep veins of left proximal lower extremity: Secondary | ICD-10-CM

## 2016-04-21 DIAGNOSIS — L89610 Pressure ulcer of right heel, unstageable: Secondary | ICD-10-CM | POA: Diagnosis not present

## 2016-04-21 DIAGNOSIS — N183 Chronic kidney disease, stage 3 (moderate): Secondary | ICD-10-CM | POA: Diagnosis not present

## 2016-04-21 DIAGNOSIS — E1122 Type 2 diabetes mellitus with diabetic chronic kidney disease: Secondary | ICD-10-CM | POA: Diagnosis not present

## 2016-04-21 DIAGNOSIS — L89151 Pressure ulcer of sacral region, stage 1: Secondary | ICD-10-CM | POA: Diagnosis not present

## 2016-04-21 DIAGNOSIS — I4892 Unspecified atrial flutter: Secondary | ICD-10-CM

## 2016-04-21 DIAGNOSIS — I130 Hypertensive heart and chronic kidney disease with heart failure and stage 1 through stage 4 chronic kidney disease, or unspecified chronic kidney disease: Secondary | ICD-10-CM | POA: Diagnosis not present

## 2016-04-21 DIAGNOSIS — Z5181 Encounter for therapeutic drug level monitoring: Secondary | ICD-10-CM

## 2016-04-21 DIAGNOSIS — I5033 Acute on chronic diastolic (congestive) heart failure: Secondary | ICD-10-CM | POA: Diagnosis not present

## 2016-04-24 ENCOUNTER — Telehealth: Payer: Self-pay | Admitting: Pulmonary Disease

## 2016-04-24 NOTE — Telephone Encounter (Signed)
Notes Recorded by Marshell Garfinkel, MD on 04/14/2016 at 10:30 AM EST Please let the patient know that the CXR shows clearing of the pneumonia. There are no acute abnormalities.  Spoke with pt's daughter Shannon [name] and made her aware of results. Shannon [name] voiced her understanding and had no further questions  Nothing further needed.

## 2016-04-25 ENCOUNTER — Telehealth: Payer: Self-pay | Admitting: *Deleted

## 2016-04-25 DIAGNOSIS — E1122 Type 2 diabetes mellitus with diabetic chronic kidney disease: Secondary | ICD-10-CM | POA: Diagnosis not present

## 2016-04-25 DIAGNOSIS — L89151 Pressure ulcer of sacral region, stage 1: Secondary | ICD-10-CM | POA: Diagnosis not present

## 2016-04-25 DIAGNOSIS — I5033 Acute on chronic diastolic (congestive) heart failure: Secondary | ICD-10-CM | POA: Diagnosis not present

## 2016-04-25 DIAGNOSIS — L89610 Pressure ulcer of right heel, unstageable: Secondary | ICD-10-CM | POA: Diagnosis not present

## 2016-04-25 DIAGNOSIS — N183 Chronic kidney disease, stage 3 (moderate): Secondary | ICD-10-CM | POA: Diagnosis not present

## 2016-04-25 DIAGNOSIS — I130 Hypertensive heart and chronic kidney disease with heart failure and stage 1 through stage 4 chronic kidney disease, or unspecified chronic kidney disease: Secondary | ICD-10-CM | POA: Diagnosis not present

## 2016-04-25 NOTE — Telephone Encounter (Signed)
Received fax from [facility] with [facility] [phone] of patient's urine culture results. Given to Janett Billow to review and sign. Place in folder.

## 2016-04-26 ENCOUNTER — Telehealth: Payer: Self-pay | Admitting: Cardiology

## 2016-04-26 DIAGNOSIS — I6789 Other cerebrovascular disease: Secondary | ICD-10-CM | POA: Diagnosis not present

## 2016-04-26 DIAGNOSIS — J449 Chronic obstructive pulmonary disease, unspecified: Secondary | ICD-10-CM | POA: Diagnosis not present

## 2016-04-26 DIAGNOSIS — I10 Essential (primary) hypertension: Secondary | ICD-10-CM | POA: Diagnosis not present

## 2016-04-26 MED ORDER — CEPHALEXIN 500 MG PO CAPS: ORAL_CAPSULE | ORAL | 0 refills | Status: DC

## 2016-04-26 NOTE — Telephone Encounter (Signed)
New Message     Daughter needs you to call her pts weight is increasing 143 1/18 and 1/24 147 , thinks she is retaining fluids ,    She is complaining of heavy chest and pulse is 102 to 81   Legs are swollen and diagnosed with UTI  Pulmonologist said that she has pneumonia and she has blood in her feeding tube from coughing

## 2016-04-26 NOTE — Telephone Encounter (Signed)
Per Dr. Herbert Deaner Keflex 500mg  #14 one capsule every 12 hours. No RF  [name], caregiver stated that patient can take this by [place].   Sent lab for scanning.

## 2016-04-26 NOTE — Telephone Encounter (Signed)
Spoke with patient's daughter (DPR on file). She states that the patient has been complaining of difficulty breathing and pressure on her chest. She states that the pain is not constant and that it only hurts when she coughs. She states that she gave her mother aspirin. She states that her mother was recently diagnosed with pneumonia and a UTI. She states that her mother has not started taking the antibiotics yet. The patient was seen by pulmonology on 04/13/16 where she was instructed to use her nebulizer and xopenex for dyspnea. I explained to her daughter that these symptoms could be related to her recent infections. She states that her BP has been around 130/70s and that her pulse has been 80-100. The daughter states that her mother has gained weight. She went from 139 lbs to 147 lbs today. She states that her mother has been taking 40 mg of lasix daily. She may be volume overloaded, not sure if she needs med changes. Please advise.

## 2016-04-26 NOTE — Telephone Encounter (Signed)
She should really see her PCP (unless she has done so already) as there is a lot of flu in the community. If she has already been diagnosed with a pneumonia she should start taking antibiotics along with inhalors/nebulizers.

## 2016-04-26 NOTE — Addendum Note (Signed)
Addended by: Rafael Bihari A on: 04/26/2016 04:31 PM   Modules accepted: Orders

## 2016-04-26 NOTE — Telephone Encounter (Signed)
Patient's daughter notified of Dr. Francesca Oman recommendations.

## 2016-04-27 ENCOUNTER — Telehealth: Payer: Self-pay | Admitting: Cardiology

## 2016-04-27 ENCOUNTER — Ambulatory Visit (INDEPENDENT_AMBULATORY_CARE_PROVIDER_SITE_OTHER): Payer: Medicare Other | Admitting: Interventional Cardiology

## 2016-04-27 ENCOUNTER — Encounter: Payer: Self-pay | Admitting: Internal Medicine

## 2016-04-27 DIAGNOSIS — I6789 Other cerebrovascular disease: Secondary | ICD-10-CM | POA: Diagnosis not present

## 2016-04-27 DIAGNOSIS — I5032 Chronic diastolic (congestive) heart failure: Secondary | ICD-10-CM

## 2016-04-27 DIAGNOSIS — I5033 Acute on chronic diastolic (congestive) heart failure: Secondary | ICD-10-CM

## 2016-04-27 DIAGNOSIS — R0602 Shortness of breath: Secondary | ICD-10-CM

## 2016-04-27 DIAGNOSIS — Z5181 Encounter for therapeutic drug level monitoring: Secondary | ICD-10-CM

## 2016-04-27 DIAGNOSIS — J449 Chronic obstructive pulmonary disease, unspecified: Secondary | ICD-10-CM | POA: Diagnosis not present

## 2016-04-27 DIAGNOSIS — I824Y2 Acute embolism and thrombosis of unspecified deep veins of left proximal lower extremity: Secondary | ICD-10-CM

## 2016-04-27 DIAGNOSIS — N183 Chronic kidney disease, stage 3 (moderate): Secondary | ICD-10-CM | POA: Diagnosis not present

## 2016-04-27 DIAGNOSIS — I10 Essential (primary) hypertension: Secondary | ICD-10-CM | POA: Diagnosis not present

## 2016-04-27 DIAGNOSIS — L89151 Pressure ulcer of sacral region, stage 1: Secondary | ICD-10-CM | POA: Diagnosis not present

## 2016-04-27 DIAGNOSIS — L89610 Pressure ulcer of right heel, unstageable: Secondary | ICD-10-CM | POA: Diagnosis not present

## 2016-04-27 DIAGNOSIS — R0600 Dyspnea, unspecified: Secondary | ICD-10-CM

## 2016-04-27 DIAGNOSIS — I130 Hypertensive heart and chronic kidney disease with heart failure and stage 1 through stage 4 chronic kidney disease, or unspecified chronic kidney disease: Secondary | ICD-10-CM | POA: Diagnosis not present

## 2016-04-27 DIAGNOSIS — E1122 Type 2 diabetes mellitus with diabetic chronic kidney disease: Secondary | ICD-10-CM | POA: Diagnosis not present

## 2016-04-27 DIAGNOSIS — I4892 Unspecified atrial flutter: Secondary | ICD-10-CM

## 2016-04-27 LAB — POCT INR: INR: 1.5

## 2016-04-27 NOTE — Telephone Encounter (Signed)
Linda from [facility] was calling just to make Dr. Meda Coffee aware that the patient's HR this morning is "irregular and is skipping every 5th beat". Patient has hx of Afib. She states that the patient is stable with BP-118/70 and HR-80. She just wanted to let our office know. Message routed to Dr. Meda Coffee.

## 2016-04-27 NOTE — Telephone Encounter (Signed)
New Message     Heart rate is irregular it skipping every 5th beat , pt has AFIB   Gave to brittany

## 2016-04-28 ENCOUNTER — Telehealth: Payer: Self-pay | Admitting: Pulmonary Disease

## 2016-04-28 ENCOUNTER — Telehealth: Payer: Self-pay | Admitting: *Deleted

## 2016-04-28 MED ORDER — DOXYCYCLINE HYCLATE 100 MG PO TABS: 100.0000 mg | ORAL_TABLET | Freq: Two times a day (BID) | ORAL | 0 refills | Status: DC

## 2016-04-28 NOTE — Telephone Encounter (Signed)
Prescribe doxy 100 mg bid for 7 days  PM

## 2016-04-28 NOTE — Telephone Encounter (Signed)
Inez Catalina with [facility] called and left message stating that they have stopped services for now per daughters request. Can restart anytime. Just call [phone] with verbal order.

## 2016-04-28 NOTE — Telephone Encounter (Signed)
Called and spoke to pt's daughter, [name]. Pt c/o increase in prod cough with yellow mucus, increase in SOB, and chest discomfort on left side of chest x 2 days. Pt denies f/c/s. Pt states she is taking tessalon and OTC cough syrup without relief. Pt is using the Duoneb 4 times a day for SOB.   Dr. Vaughan Browner please advise. Thanks.

## 2016-04-28 NOTE — Telephone Encounter (Signed)
OK to take both antibiotics.  PM

## 2016-04-28 NOTE — Telephone Encounter (Signed)
Pt's daughter is made aware of PM's recommendations & voiced her understanding.  Rx sent to preferred pharmacy. Nothing further needed.

## 2016-04-28 NOTE — Telephone Encounter (Signed)
Spoke with pt's daughter and made her aware of below message. [name] states pt is currently taking cethalexin 500mg  for UTI. [name] wants to make sure it okay for pt to take both abx.  PM please advise. Thanks.

## 2016-05-01 ENCOUNTER — Encounter: Payer: Self-pay | Admitting: Internal Medicine

## 2016-05-01 ENCOUNTER — Ambulatory Visit (INDEPENDENT_AMBULATORY_CARE_PROVIDER_SITE_OTHER): Payer: Medicare Other | Admitting: Internal Medicine

## 2016-05-01 DIAGNOSIS — I824Y2 Acute embolism and thrombosis of unspecified deep veins of left proximal lower extremity: Secondary | ICD-10-CM

## 2016-05-01 DIAGNOSIS — N183 Chronic kidney disease, stage 3 (moderate): Secondary | ICD-10-CM | POA: Diagnosis not present

## 2016-05-01 DIAGNOSIS — I130 Hypertensive heart and chronic kidney disease with heart failure and stage 1 through stage 4 chronic kidney disease, or unspecified chronic kidney disease: Secondary | ICD-10-CM | POA: Diagnosis not present

## 2016-05-01 DIAGNOSIS — I5033 Acute on chronic diastolic (congestive) heart failure: Secondary | ICD-10-CM | POA: Diagnosis not present

## 2016-05-01 DIAGNOSIS — I4892 Unspecified atrial flutter: Secondary | ICD-10-CM

## 2016-05-01 DIAGNOSIS — L89151 Pressure ulcer of sacral region, stage 1: Secondary | ICD-10-CM | POA: Diagnosis not present

## 2016-05-01 DIAGNOSIS — L89610 Pressure ulcer of right heel, unstageable: Secondary | ICD-10-CM | POA: Diagnosis not present

## 2016-05-01 DIAGNOSIS — Z5181 Encounter for therapeutic drug level monitoring: Secondary | ICD-10-CM

## 2016-05-01 DIAGNOSIS — E1122 Type 2 diabetes mellitus with diabetic chronic kidney disease: Secondary | ICD-10-CM | POA: Diagnosis not present

## 2016-05-01 LAB — POCT INR: INR: 1.5

## 2016-05-02 ENCOUNTER — Encounter (HOSPITAL_COMMUNITY): Payer: Self-pay | Admitting: Interventional Radiology

## 2016-05-02 ENCOUNTER — Ambulatory Visit (HOSPITAL_COMMUNITY)
Admission: RE | Admit: 2016-05-02 | Discharge: 2016-05-02 | Disposition: A | Discharge status: Home / Self Care | Payer: Medicare Other | Source: Ambulatory Visit | Attending: Interventional Radiology | Admitting: Interventional Radiology

## 2016-05-02 ENCOUNTER — Other Ambulatory Visit (HOSPITAL_COMMUNITY): Payer: Self-pay | Admitting: Interventional Radiology

## 2016-05-02 ENCOUNTER — Telehealth: Payer: Self-pay | Admitting: Student

## 2016-05-02 ENCOUNTER — Telehealth: Payer: Self-pay | Admitting: Internal Medicine

## 2016-05-02 DIAGNOSIS — Z434 Encounter for attention to other artificial openings of digestive tract: Secondary | ICD-10-CM | POA: Diagnosis not present

## 2016-05-02 DIAGNOSIS — R633 Feeding difficulties, unspecified: Secondary | ICD-10-CM

## 2016-05-02 DIAGNOSIS — K9423 Gastrostomy malfunction: Secondary | ICD-10-CM | POA: Diagnosis not present

## 2016-05-02 HISTORY — PX: IR GENERIC HISTORICAL: IMG1180011

## 2016-05-02 MED ORDER — IOPAMIDOL (ISOVUE-300) INJECTION 61%
INTRAVENOUS | Status: AC
  Administered 2016-05-02: 20 mL
  Filled 2016-05-02: qty 50

## 2016-05-02 NOTE — Progress Notes (Signed)
Patient with GJ placed by Dr. Anselm Pancoast 10/07/2015 due to gastric outlet obstruction.  Tube replaced 01/07/16 due to jejunal arm pulled back into the stomach and again replaced 02/08/16 due to coiling in the stomach. Daughter called with concern for 500 mL "bright red" output from gastric tube last night.  States patient ate red jello at dinner, then later felt distended and had red gastric contents aspirated from G-tube.  No hematemesis, no cough-ground contents aspirated. Daughter also states patient has had a UTI and pneumonia- currently on antibiotics for both.  With coughing, she states patient complains of discomfort at insertion site.  Daughter reports the J-port "isn't fitting like it's supposed to."  Discussed with Dr. Laurence Ferrari who recommends patient be seen today.  Have made an appointment in [facility] Radiology department.  Informed daughter of plan.  She is in agreement.   Brynda Greathouse, MS RD PA-C 05/02/16 10:02 AM

## 2016-05-02 NOTE — Telephone Encounter (Signed)
Left message for [facility] RN that pts daughter called IR and pt was seen today by them at 12:30pm. If they are still having issues with tube IR should be called back as they placed the tube.

## 2016-05-04 ENCOUNTER — Telehealth: Payer: Self-pay

## 2016-05-04 DIAGNOSIS — N183 Chronic kidney disease, stage 3 (moderate): Secondary | ICD-10-CM | POA: Diagnosis not present

## 2016-05-04 DIAGNOSIS — L89151 Pressure ulcer of sacral region, stage 1: Secondary | ICD-10-CM | POA: Diagnosis not present

## 2016-05-04 DIAGNOSIS — L89610 Pressure ulcer of right heel, unstageable: Secondary | ICD-10-CM | POA: Diagnosis not present

## 2016-05-04 DIAGNOSIS — I130 Hypertensive heart and chronic kidney disease with heart failure and stage 1 through stage 4 chronic kidney disease, or unspecified chronic kidney disease: Secondary | ICD-10-CM | POA: Diagnosis not present

## 2016-05-04 DIAGNOSIS — E1122 Type 2 diabetes mellitus with diabetic chronic kidney disease: Secondary | ICD-10-CM | POA: Diagnosis not present

## 2016-05-04 DIAGNOSIS — I5033 Acute on chronic diastolic (congestive) heart failure: Secondary | ICD-10-CM | POA: Diagnosis not present

## 2016-05-04 NOTE — Telephone Encounter (Signed)
[facility] is calling to inform Dr Meda Coffee that the pt has gained 4 lbs in a weeks time.   Per Jacques Navy, she wanted to make Dr Meda Coffee aware that the pt is still coughing and at times becomes sob. [name] states pt is in no acute distress during this time or at this current time.   Per Jacques Navy, its questionable though if the pt has aspiration pneumonia vs worsening heart failure.   Per Jacques Navy, the pt also had a known UTI over a week and 1/2 ago, and has currently been treated for this.   [name] RN wants to request for Dr Meda Coffee to adjust her lasix, for weight gain.  Per Jacques Navy, pts PCP Dr Eulas Post, follows the pt closely and follows pts home health orders.  Suggested to [facility] that given the pts renal failure, recent UTI, high risk for aspiration pneumonia (has a PEG tube which is how pt receives her meds in the home), and having heart failure, she should request from the pts PCP for a verbal order to have a mobile chest x ray done.   Informed [name] that the chest x ray will safely be able to address the pts issues she voiced.   Informed [name] that Dr Meda Coffee is out of the office today, but I will still route this message to her for further review and recommendation, and follow-up with her thereafter if any new changes need to be made.   Per Jacques Navy, she verbalized understanding and agrees with this plan.  Per Jacques Navy, she states that she will call the pts PCP Dr Eulas Post now, and obtain an order to have a mobile chest x ray done on the pt while in the home.  [name] gracious for all the assistance provided.

## 2016-05-04 NOTE — Telephone Encounter (Signed)
Spoke with Jacques Navy at [facility].  Informed [name] that per Dr Meda Coffee, she recommends that the pt have her chest X ray done and increase lasix to 40 mg po in the am and 40 mg po in the pm for the next 5 days, and her come to clinic on Monday with PA, and same day  BMP and BNP. Per Jacques Navy, on their records the daughter reports that she is giving the pt lasix 40 mg po bid already.  [name] states she will re-clarify with the pts daughter, but that's what they have on their records while caring for the pt in the home.  Informed Dr Meda Coffee of this and she recommends that if the pt is truly taking lasix 40 mg po bid,  then she should then just come in to see a PA on Monday 05/08/16, with a BMET and BNP same day.  Per Jacques Navy, she will call the daughter to confirm this and call our office back tomorrow at 0800.   [name] RN also stated that the pts chest x ray will be done tomorrow morning, and she will call with those results as well.  [name] RN states that this is easier for the pt, given her multiple health issues, and PEG tube.   Will tentatively hold a slot on Bonney Leitz PA-C schedule for 1030 with this pt.  Valetta Fuller recently saw this pt and is familiar with her history.  Will confirm for appt tomorrow, once home health nurse returns a call back.

## 2016-05-04 NOTE — Telephone Encounter (Signed)
Please recommend a chest X ray and increase lasix to 40 mg po in the am and 40 mg po in the pm for the next 5 days, and her come to clinic on Monday with BMP and BNP.

## 2016-05-04 NOTE — Telephone Encounter (Signed)
I spoke with [name] and informed her of Dr.Carter's decision. I verified office number to call for the one given was not a working number.   [facility] office number is [phone]  I called [place] and gave verbal order (spoke with Sharee Pimple)

## 2016-05-04 NOTE — Telephone Encounter (Signed)
Ok for CXR for cough and SOB

## 2016-05-04 NOTE — Telephone Encounter (Signed)
Spoke with the pts Daughter and she confirms that she is giving the pt lasix 40 mg po BID.  Daughter reports that she understood that was the frequency of how she was to give this to the pt.  Informed the pts Daughter that last lasix dose advised was on 03/21/16 with [facility], and the pt was to take lasix 40 mg po daily. Informed the pts Daughter that with this information given, Dr Meda Coffee recommends that she continue regimen as is, and she wants her to proceed with getting her chest x ray done tomorrow, come in to see Bonney Leitz PA-C on Monday 05/08/16 at 1030, and have pts labs drawn prior to that appt to check a bmet and bnp.  Advised the daughter that she should bring the pt in a few mins early to have labs drawn on 2/5.  Daughter verbalized understanding and agrees with this plan.  Daughter gracious for all the assistance provided.

## 2016-05-04 NOTE — Telephone Encounter (Addendum)
Dr Meda Coffee her PCP ordered for her to have a chest x ray done in the home.  Her [place] reports the pt is already taking lasix 40 mg po BID , despite that not being the dose you recommended.  Looks like her chest  x ray results aren't back yet but you will be able to view in EPIC, for her PCP is in the system.  Do you want to change orders around or wait for chest x ray results?

## 2016-05-04 NOTE — Telephone Encounter (Signed)
Shannon [name] with [facility] called to request order for Mobile Xray. Patient still with productive cough and trouble breathing. Shannon [name] is not sure if this is related to pneumonia or diagnosis of congestive heart failure.  Verbal order can be given for Xray, call the office @ [phone]. If ok to give verbal please provide diagnosis to be attached to order.

## 2016-05-04 NOTE — Telephone Encounter (Signed)
Follow up    Pt has gained 4lbs of fluid, she has congestive heart failure, pt is having coughing and shortness of breath, pt also has upper respiratory infection.  102/60 as of this morning, orthostatic dizziness , is there anything you can do for water retention, she is on lasix 40 mg 2x a day

## 2016-05-05 ENCOUNTER — Telehealth: Payer: Self-pay | Admitting: Cardiology

## 2016-05-05 ENCOUNTER — Encounter: Payer: Self-pay | Admitting: *Deleted

## 2016-05-05 ENCOUNTER — Telehealth: Payer: Self-pay

## 2016-05-05 DIAGNOSIS — E1122 Type 2 diabetes mellitus with diabetic chronic kidney disease: Secondary | ICD-10-CM | POA: Diagnosis not present

## 2016-05-05 DIAGNOSIS — L89151 Pressure ulcer of sacral region, stage 1: Secondary | ICD-10-CM | POA: Diagnosis not present

## 2016-05-05 DIAGNOSIS — N183 Chronic kidney disease, stage 3 (moderate): Secondary | ICD-10-CM | POA: Diagnosis not present

## 2016-05-05 DIAGNOSIS — I5033 Acute on chronic diastolic (congestive) heart failure: Secondary | ICD-10-CM | POA: Diagnosis not present

## 2016-05-05 DIAGNOSIS — I130 Hypertensive heart and chronic kidney disease with heart failure and stage 1 through stage 4 chronic kidney disease, or unspecified chronic kidney disease: Secondary | ICD-10-CM | POA: Diagnosis not present

## 2016-05-05 DIAGNOSIS — L89610 Pressure ulcer of right heel, unstageable: Secondary | ICD-10-CM | POA: Diagnosis not present

## 2016-05-05 NOTE — Telephone Encounter (Signed)
Left a message for Shannon [name] [facility] with [facility] to call back.

## 2016-05-05 NOTE — Telephone Encounter (Signed)
[name] RN with [facility] calling to inform Dr Meda Coffee and myself that the mobile x ray is at the pts home and she is currently having this test done.  Per [name], she will call us back with the pts chest x ray results, once its complete.

## 2016-05-05 NOTE — Telephone Encounter (Signed)
Follow Up  Shannon [name] returning phone call from [facility]... Per Shannon [name] pt has been taking lasix twice a day according to pill bottle and never made a change in December.

## 2016-05-05 NOTE — Telephone Encounter (Signed)
Per Dr.Carter- She does not qualify for home O2 per test results.  Discussed results with Shannon [name] verbalized understanding of results, report sent to scanning

## 2016-05-05 NOTE — Telephone Encounter (Signed)
[name] RN with [facility] calling to inform Dr Meda Coffee and myself that the mobile x ray is at the pts home and she is currently having this test done.  Per [name], she will call us back with the pts chest x ray results, once its complete.

## 2016-05-07 NOTE — Progress Notes (Signed)
**Note Shannon-Identified via Obfuscation** Cardiology Office Note    Date:  05/08/2016   ID:  Shannon [name], DOB 08-05-1923, MRN [id number]  PCP:  Shannon Boys, DO  Cardiologist:  Dr. Delton [name]  CC: SOB and cough  History of Present Illness:  Shannon [name] is a 83 y.o. female with a history of persistent atrial fibrillation on amiodarone, bradycardia, CKD stage III,hypothyroidism,non-obstructive coronary artery disease, type 2 diabetes, hypertension, chronic diastolic CHF, stroke, cerebrovascular disease, COPD, asthma, gastric outlet obstruction s/p prior J tube, anemia and hyponatremia who presents to clinic for evaluation of SOB and cough.  She has had multiple recent hospitalizations, she was admitted in 11/2015 with bradycardia and weakness with HRs in the 30s-40s. Her labetololwas discontinued and amiodarone was reduced from 200 mg daily to 100 mg daily. She was seen in the office 01/24/16 extremely SOB with rapid atrial flutter and hypoxia. She was admitted to [facility] and upon arrival she required Bipap and IV Lasix with rapid improvement with brisk UOP. She converted to NSR on amiodarone drip. She was also noted to have a LLE DVT. Her Eliquis was held and she was started on IV heparin. Hematology saw her and recommended Xarelto but with her GFR <30, Coumadin was started. Her amiodarone was increased to 200mg  BID x 2 weeks with plans to decrease to 200mg  daily as OP if remaining in NSR. IR also saw her for J tube obstruction. 2D Echo 01/27/16 showed EF 55-60%, grade 1 DD, mild AI/MR, mod LAE.   Unfortunately she was re-admitted 11/6-11/11 with worsening SOB and chest pain with fever, leukocytosis, tachycardia, AKI on CKD, and was treated for sepsis due to UTI. Her Coumadin was held due to supratherapeutic range. EKG on 02/07/16 showed atrial flutter.  She was admitted from the office on 03/02/16 for acute on chronic diastolic CHF. Diuresed 5 L and 21 lbs. Discharge weight 133. Treated for aspiration PNA, told to go back on  thickened liquid diet. Discharged on Lasix 40mg  daily. Palliative care was consulted. She was sent home with palliative care services. Limited code.   I saw her in clinic for follow up and she was doing relatively well. Her weight was up and HR 106, but since she was feeling so good, we decided to continued observation.   Per phone notes, she has had a cough and weight gain. She was added onto my schedule for further evaluation. She comes into the office today with her daughter. She has had worsening SOB and chills since last week. Also has some LE edema (L>R), orthopnea and PND. Had a CXR last Friday with no CHF or PNA. She has had a worsening productive cough with purulent looking sputum. No chest pain. She just feels terrible and cannot breath. Hunched over today and very tachypnic. No palpitations, dizziness or syncope.      Past Medical History:  Diagnosis Date  . Anemia    previous blood transfusions  . Arthritis    "all over"  . Asthma   . Bradycardia    requiring previous d/c of BB and reduction of amiodarone  . CAD (coronary artery disease)    nonobstructive per notes  . Chronic diastolic CHF (congestive heart failure) (HCC)   . CKD (chronic kidney disease), stage III   . Complication of blood transfusion    "got the wrong blood type at [facility] in ~ 2015; no adverse reaction that we are aware of"/daughter, Shannon [name] (01/27/2016)  . COPD (chronic obstructive pulmonary disease) (HCC)   . Depression    "  light case"  . DVT (deep venous thrombosis) (HCC) 01/2016   a. LLE DVT 01/2016 - switched from Eliquis to Coumadin.  . Gastric stenosis    a. s/p stomach tube  . GERD (gastroesophageal reflux disease)   . History of blood transfusion    "several" (01/27/2016)  . History of stomach ulcers   . Hyperlipidemia   . Hypertension   . Hypothyroidism   . Paraesophageal hernia   . Perforated gastric ulcer (HCC)   . Seasonal allergies   . SIADH (syndrome of inappropriate ADH  production) (HCC)    Shannon [name] 01/10/2015  . Small bowel obstruction    "I don't know how many" (01/11/2015)  . Stroke (HCC)    "light one"  . Type II diabetes mellitus (HCC)    "related to prednisone use  for > 20 yr; once predinose stopped; no more DM RX" (01/27/2016)  . Ventral hernia with bowel obstruction     Past Surgical History:  Procedure Laterality Date  . CATARACT EXTRACTION W/ INTRAOCULAR LENS  IMPLANT, BILATERAL    . CHOLECYSTECTOMY OPEN    . COLECTOMY    . ESOPHAGOGASTRODUODENOSCOPY N/A 01/19/2014   Procedure: ESOPHAGOGASTRODUODENOSCOPY (EGD);  Surgeon: Shannon Fredrickson, MD;  Location: [facility] END[facility]COPY;  Service: Endoscopy;  Laterality: N/A;  . ESOPHAGOGASTRODUODENOSCOPY N/A 01/20/2014   Procedure: ESOPHAGOGASTRODUODENOSCOPY (EGD);  Surgeon: Shannon Fredrickson, MD;  Location: [facility] ENDOSCOPY;  Service: Endoscopy;  Laterality: N/A;  . ESOPHAGOGASTRODUODENOSCOPY N/A 03/19/2014   Procedure: ESOPHAGOGASTRODUODENOSCOPY (EGD);  Surgeon: Shannon Fee, MD;  Location: [facility] ENDOSCOPY;  Service: Endoscopy;  Laterality: N/A;  . ESOPHAGOGASTRODUODENOSCOPY N/A 07/08/2015   Procedure: ESOPHAGOGASTRODUODENOSCOPY (EGD);  Surgeon: Shannon Rist, MD;  Location: [facility] ENDOSCOPY;  Service: Endoscopy;  Laterality: N/A;  . ESOPHAGOGASTRODUODENOSCOPY (EGD) WITH PROPOFOL N/A 09/15/2015   Procedure: ESOPHAGOGASTRODUODENOSCOPY (EGD) WITH PROPOFOL;  Surgeon: Shannon Frederick, MD;  Location: [facility] ENDOSCOPY;  Service: Gastroenterology;  Laterality: N/A;  . GASTROJEJUNOSTOMY     hx/notes 01/10/2015  . GASTROJEJUNOSTOMY N/A 09/23/2015   Procedure: OPEN GASTROJEJUNOSTOMY TUBE PLACEMENT;  Surgeon: Shannon Blanch Kinsinger, MD;  Location: [facility] ORS;  Service: General;  Laterality: N/A;  . GLAUCOMA SURGERY Bilateral   . HERNIA REPAIR  2015  . IR GENERIC HISTORICAL  01/07/2016   IR GJ TUBE CHANGE 01/07/2016 Shannon Moan, MD [facility]INTER[facility] RAD  . IR GENERIC HISTORICAL  01/27/2016   IR MECH REMOV OBSTRUC MAT ANY COLON TUBE W/FLUORO  01/27/2016 Shannon Overlie, MD [facility]  . IR GENERIC HISTORICAL  02/07/2016   IR PATIENT EVAL TECH 0-60 MINS Shannon K Allred, PA-C [facility]  . IR GENERIC HISTORICAL  02/08/2016   IR GJ TUBE CHANGE 02/08/2016 Berdine Dance, MD [facility]  . IR GENERIC HISTORICAL  01/06/2016   IR GJ TUBE CHANGE 01/06/2016 [facility]-RAD OUT REF  . IR GENERIC HISTORICAL  05/02/2016   IR CM INJ ANY COLONIC TUBE W/FLUORO 05/02/2016 Oley Balm, MD [facility]  . LAPAROTOMY N/A 01/20/2015   Procedure: EXPLORATORY LAPAROTOMY;  Surgeon: Abigail Miyamoto, MD;  Location: [facility] OR;  Service: General;  Laterality: N/A;  . LYSIS OF ADHESION N/A 01/20/2015   Procedure: LYSIS OF ADHESIONS < 1 HOUR;  Surgeon: Abigail Miyamoto, MD;  Location: [facility] OR;  Service: General;  Laterality: N/A;  . TONSILLECTOMY    . TUBAL LIGATION    . VENTRAL HERNIA REPAIR  2015   incarcerated ventral hernia ([facility] 09/2013)/notes 01/10/2015    Current Medications: Outpatient Medications Prior to Visit  Medication Sig Dispense Refill  .  acetaminophen (TYLENOL) 325 MG tablet Take 650 mg by mouth 2 (two) times daily as needed for mild pain.     [name] ADVAIR DISKUS 250-50 MCG/DOSE AEPB INHALE 1 PUFF INTO THE LUNGS 2 (TWO) TIMES DAILY AS NEEDED FOR SHORTNESS OF BREATH  3  . AMINO ACIDS-PROTEIN HYDROLYS PO Take 30 mLs by mouth 2 (two) times daily.    [name] amiodarone (PACERONE) 200 MG tablet Take 200 mg by mouth daily.    [name] atorvastatin (LIPITOR) 10 MG tablet Take 10 mg by mouth daily.    [name] b complex vitamins tablet Take 1 tablet by mouth daily.     . benzonatate (TESSALON) 200 MG capsule Take 1 capsule (200 mg total) by mouth 3 (three) times daily as needed for cough. 60 capsule 1  . bisacodyl (DULCOLAX) 10 MG suppository Place 1 suppository (10 mg total) rectally daily as needed for moderate constipation. 12 suppository 0  . calcium-vitamin D (OSCAL WITH D) 500-200 MG-UNIT per tablet Take 1 tablet by mouth daily with breakfast.     . cetirizine (ZYRTEC) 10 MG  tablet Take 10 mg by mouth daily as needed for allergies or rhinitis.    . chlorhexidine (PERIDEX) 0.12 % solution Use as directed 15 mLs in the mouth or throat 2 (two) times daily.    [name] diltiazem (CARDIZEM CD) 120 MG 24 hr capsule Take 1 capsule (120 mg total) by mouth daily. 90 capsule 3  . feeding supplement (BOOST / RESOURCE BREEZE) LIQD Take 1 Container by mouth daily.    . fluticasone (FLONASE) 50 MCG/ACT nasal spray Place 2 sprays into both nostrils daily. 16 g 3  . gabapentin (NEURONTIN) 300 MG capsule Take 1 capsule (300 mg total) by mouth 3 (three) times daily. 90 capsule 12  . hydrALAZINE (APRESOLINE) 50 MG tablet Take 1 tablet (50 mg total) by mouth 3 (three) times daily. 120 tablet 0  . HYDROcodone-acetaminophen (NORCO) 5-325 MG tablet Take 1 tablet by mouth every 6 (six) hours as needed for moderate pain. 30 tablet 0  . ipratropium-albuterol (DUONEB) 0.5-2.5 (3) MG/3ML SOLN Take 3 mLs by nebulization every 6 (six) hours as needed (sob). Use 3 times daily x 4 days then every 6 hours as needed. 360 mL 4  . isosorbide mononitrate (IMDUR) 30 MG 24 hr tablet TAKE 1 TABLET (30 MG TOTAL) BY MOUTH DAILY. 90 tablet 3  . latanoprost (XALATAN) 0.005 % ophthalmic solution Place 1 drop into both eyes at bedtime. 2.5 mL 12  . levalbuterol (XOPENEX HFA) 45 MCG/ACT inhaler Inhale 2 puffs into the lungs every 6 (six) hours as needed for wheezing or shortness of breath. 1 Inhaler 12  . levothyroxine (SYNTHROID, LEVOTHROID) 137 MCG tablet TAKE 1 TABLET (137 MCG TOTAL) BY MOUTH DAILY BEFORE BREAKFAST. 30 tablet 2  . Maltodextrin-Xanthan Gum (RESOURCE THICKENUP CLEAR) POWD Take 120 g by mouth as needed (use to thicken liquids.). 20 Can 0  . Multiple Vitamin (MULTIVITAMIN WITH MINERALS) TABS tablet Take 1 tablet by mouth daily.    . Nutritional Supplements (FEEDING SUPPLEMENT, OSMOLITE 1.5 CAL,) LIQD Place 1,000 mLs into feeding tube daily. 1000 mL 3  . pantoprazole (PROTONIX) 40 MG tablet TAKE 1 TABLET BY  MOUTH TWICE A DAY 60 tablet 5  . Polyethyl Glycol-Propyl Glycol 0.4-0.3 % SOLN Place 1 drop into both eyes 4 (four) times daily.     . polyethylene glycol powder (GLYCOLAX/MIRALAX) powder MIX 17GRAMS IN 8 OUNCES OF LIQUID AND DRINK TWICE DAILY AS NEEDED 3162 g 1  .  RESTASIS 0.05 % ophthalmic emulsion USE 1 DROP INTO BOTH EYES TWICE DAILY 60 mL 0  . sertraline (ZOLOFT) 100 MG tablet TAKE ONE TABLET BY MOUTH ONCE DAILY FOR DEPRESSION 90 tablet 1  . traMADol (ULTRAM) 50 MG tablet Take 50 mg by mouth daily as needed for moderate pain.     [name] warfarin (COUMADIN) 2.5 MG tablet Take as directed by Coumadin Clinic. (Patient taking differently: Take 2.5 mg by mouth daily. Take as directed by Coumadin Clinic.) 30 tablet 1  . Water For Irrigation, Sterile (FREE WATER) SOLN Place 230 mLs into feeding tube 3 (three) times daily.    . cephALEXin (KEFLEX) 500 MG capsule Take one capsule every 12 hours for infection 14 capsule 0  . doxycycline (VIBRA-TABS) 100 MG tablet Take 1 tablet (100 mg total) by mouth 2 (two) times daily. 14 tablet 0  . furosemide (LASIX) 40 MG tablet Take 1 tablet (40 mg total) by mouth daily. 30 tablet 12   Facility-Administered Medications Prior to Visit  Medication Dose Route Frequency Provider Last Rate Last Dose  . betamethasone acetate-betamethasone sodium phosphate (CELESTONE) injection 3 mg  3 mg Intramuscular Once Felecia Shelling, DPM         Allergies:   Penicillins   Social History   Social History  . Marital status: Widowed    Spouse name: N/A  . Number of children: 3  . Years of education: N/A   Social History Main Topics  . Smoking status: Never Smoker  . Smokeless tobacco: Former Neurosurgeon    Types: Snuff     Comment: "used snuff in my younger days"  . Alcohol use No  . Drug use: No  . Sexual activity: No   Other Topics Concern  . None   Social History Narrative   Married in [place], lives in one story house with 2 people and no pets   Occupation: ?   Has a  living Will, [place], and doesn't have DNR   Was living with Husband (in frail health) in [place] until 09/2013.  After her [facility] admission they both came to live with daughter in [place]     Family History:  The patient's family history includes Diabetes in her brother; Hypertension in her mother; Stroke in her mother.     ROS:   Please [name] the history of present illness.    ROS All other systems reviewed and are negative.   PHYSICAL EXAM:   VS:  BP 128/82   Pulse 95   Ht 5\' 6"  (1.676 m)   Wt 145 lb (65.8 kg)   BMI 23.40 kg/m    GEN: Well nourished, well developed, in no acute distress, elderly and frail appearing. Appears uncomfortable with significant SOB.  HEENT: normal  Neck: no JVD, carotid bruits, or masses Cardiac: irreg irreg; no murmurs, rubs, or gallops, trace RLE, 1 + LLE  Respiratory:  clear to auscultation bilaterally, normal work of breathing GI: soft, nontender, nondistended, + BS MS: no deformity or atrophy  Skin: warm and dry, no rash Neuro:  Alert and Oriented x 3, Strength and sensation are intact Psych: euthymic mood, full affect    Wt Readings from Last 3 Encounters:  05/08/16 145 lb (65.8 kg)  04/13/16 143 lb 3.2 oz (65 kg)  03/24/16 143 lb 6.4 oz (65 kg)      Studies/Labs Reviewed:   EKG:  EKG is NOT ordered today.    Recent Labs: 11/11/2015: Magnesium 1.8 03/02/2016: ALT 30; B  Natriuretic Peptide 515.7 03/11/2016: TSH 5.642 05/08/2016: BUN 56; Creatinine, Ser 1.63; Hemoglobin 10.2; Platelets 301; Potassium 4.3; Sodium 123   Lipid Panel    Component Value Date/Time   CHOL 126 04/07/2015 1015   TRIG 107 04/07/2015 1015   HDL 69 04/07/2015 1015   CHOLHDL 1.8 04/07/2015 1015   CHOLHDL 1.9 12/24/2013 0456   VLDL 26 12/24/2013 0456   LDLCALC 36 04/07/2015 1015    Additional studies/ records that were reviewed today include:  2D ECHO: 01/27/2016 LV EF: 55% - 60% Study Conclusions - Left ventricle: The cavity size was normal. Wall  thickness was normal. Systolic function was normal. The estimated ejection fraction was in the range of 55% to 60%. Wall motion was normal; there were no regional wall motion abnormalities. Doppler parameters are consistent with abnormal left ventricular relaxation (grade 1 diastolic dysfunction). - Aortic valve: There was mild regurgitation. - Mitral valve: There was mild regurgitation. - Left atrium: The atrium was moderately dilated. Impressions: - Normal LV systolic function; grade 1 diastolic dysfunction; mild AI; mild MR; moderate LAE.    ASSESSMENT & PLAN:   Dyspnea and cough: coughing up purulent looking sputum. CXR on 05/05/16 showed no acute pulmonary disease. Could be a combination of URI and CHF. Will need further work up in hospital. I tried to directly admit but no beds available at this time. She has been told to go to ER for further work up and treatment. I would recommend hospitalist to admit  Acute on chronic diastolic CHF: weight is up since last discharge but doesn't appear significnatly volume overloaded. [name] above. She may require IV diuresis.   Atrial flutter on Coumadin: HR 95. Continue Cardizem CD 120mg  daily and Coumadin for CHADSVASC of at least 6.   Gastric outlet obstruction: has chronic J tube   Hx of CVA: continue coumadin  Hyponatremia: normalized by last BMP, check BMET in hospital   CKD: last creat ~1.9. Recheck in hospital   Dispo: I think we need to re-initiate palliative care. Daughter seems resistant to this  Total time spent with patient was over 40 minutes which included evaluating patient, reviewing record and coordinating care. Face to face time >50%. She appears very sick. Dr. Okey Dupre saw patient with me today. Unsure if this is a URI vs CHF. She has chronic J tube and chills. We tried to admit her but no beds available, so she will have to go through the ER.    Medication Adjustments/Labs and Tests Ordered: Current medicines are  reviewed at length with the patient today.  Concerns regarding medicines are outlined above.  Medication changes, Labs and Tests ordered today are listed in the Patient Instructions below. Patient Instructions  Medication Instructions:  Your physician recommends that you continue on your current medications as directed. Please refer to the Current Medication list given to you today.   Labwork: None ordered  Testing/Procedures: None ordered  Follow-Up: Your physician recommends that you schedule a follow-up appointment in:    Any Other Special Instructions Will Be Listed Below (If Applicable).     If you need a refill on your cardiac medications before your next appointment, please call your pharmacy.      Signed, Cline Crock, PA-C  05/08/2016 1:21 PM    [facility] [address] [place], [place], [place]  [address] Phone: [phone]; Fax: [phone]

## 2016-05-08 ENCOUNTER — Ambulatory Visit (INDEPENDENT_AMBULATORY_CARE_PROVIDER_SITE_OTHER): Payer: Medicare Other | Admitting: Physician Assistant

## 2016-05-08 ENCOUNTER — Other Ambulatory Visit: Payer: Medicare Other | Admitting: *Deleted

## 2016-05-08 ENCOUNTER — Telehealth: Payer: Self-pay | Admitting: *Deleted

## 2016-05-08 ENCOUNTER — Inpatient Hospital Stay (HOSPITAL_COMMUNITY)
Admission: EM | Admit: 2016-05-08 | Discharge: 2016-05-17 | DRG: 193 | Disposition: A | Discharge status: Home Health | Payer: Medicare Other | Attending: Internal Medicine | Admitting: Internal Medicine

## 2016-05-08 ENCOUNTER — Encounter: Payer: Self-pay | Admitting: Physician Assistant

## 2016-05-08 ENCOUNTER — Encounter (HOSPITAL_COMMUNITY): Payer: Self-pay | Admitting: Emergency Medicine

## 2016-05-08 ENCOUNTER — Emergency Department (HOSPITAL_COMMUNITY): Payer: Medicare Other

## 2016-05-08 VITALS — BP 128/82 | HR 95 | Ht 66.0 in | Wt 145.0 lb

## 2016-05-08 DIAGNOSIS — E785 Hyperlipidemia, unspecified: Secondary | ICD-10-CM | POA: Diagnosis present

## 2016-05-08 DIAGNOSIS — N183 Chronic kidney disease, stage 3 unspecified: Secondary | ICD-10-CM | POA: Diagnosis present

## 2016-05-08 DIAGNOSIS — J9691 Respiratory failure, unspecified with hypoxia: Secondary | ICD-10-CM | POA: Diagnosis not present

## 2016-05-08 DIAGNOSIS — Z431 Encounter for attention to gastrostomy: Secondary | ICD-10-CM | POA: Diagnosis not present

## 2016-05-08 DIAGNOSIS — Z8711 Personal history of peptic ulcer disease: Secondary | ICD-10-CM | POA: Diagnosis not present

## 2016-05-08 DIAGNOSIS — Z880 Allergy status to penicillin: Secondary | ICD-10-CM

## 2016-05-08 DIAGNOSIS — I130 Hypertensive heart and chronic kidney disease with heart failure and stage 1 through stage 4 chronic kidney disease, or unspecified chronic kidney disease: Secondary | ICD-10-CM | POA: Diagnosis not present

## 2016-05-08 DIAGNOSIS — Z9842 Cataract extraction status, left eye: Secondary | ICD-10-CM | POA: Diagnosis not present

## 2016-05-08 DIAGNOSIS — I509 Heart failure, unspecified: Secondary | ICD-10-CM | POA: Diagnosis not present

## 2016-05-08 DIAGNOSIS — I5032 Chronic diastolic (congestive) heart failure: Secondary | ICD-10-CM

## 2016-05-08 DIAGNOSIS — Z66 Do not resuscitate: Secondary | ICD-10-CM | POA: Diagnosis present

## 2016-05-08 DIAGNOSIS — I4892 Unspecified atrial flutter: Secondary | ICD-10-CM | POA: Diagnosis not present

## 2016-05-08 DIAGNOSIS — R0600 Dyspnea, unspecified: Secondary | ICD-10-CM

## 2016-05-08 DIAGNOSIS — K311 Adult hypertrophic pyloric stenosis: Secondary | ICD-10-CM

## 2016-05-08 DIAGNOSIS — M1990 Unspecified osteoarthritis, unspecified site: Secondary | ICD-10-CM | POA: Diagnosis present

## 2016-05-08 DIAGNOSIS — E1142 Type 2 diabetes mellitus with diabetic polyneuropathy: Secondary | ICD-10-CM | POA: Diagnosis not present

## 2016-05-08 DIAGNOSIS — Z7901 Long term (current) use of anticoagulants: Secondary | ICD-10-CM

## 2016-05-08 DIAGNOSIS — I4819 Other persistent atrial fibrillation: Secondary | ICD-10-CM | POA: Diagnosis present

## 2016-05-08 DIAGNOSIS — Z9841 Cataract extraction status, right eye: Secondary | ICD-10-CM | POA: Diagnosis not present

## 2016-05-08 DIAGNOSIS — I483 Typical atrial flutter: Secondary | ICD-10-CM | POA: Diagnosis not present

## 2016-05-08 DIAGNOSIS — J449 Chronic obstructive pulmonary disease, unspecified: Secondary | ICD-10-CM | POA: Diagnosis present

## 2016-05-08 DIAGNOSIS — I481 Persistent atrial fibrillation: Secondary | ICD-10-CM | POA: Diagnosis present

## 2016-05-08 DIAGNOSIS — I2510 Atherosclerotic heart disease of native coronary artery without angina pectoris: Secondary | ICD-10-CM | POA: Diagnosis present

## 2016-05-08 DIAGNOSIS — J189 Pneumonia, unspecified organism: Principal | ICD-10-CM | POA: Diagnosis present

## 2016-05-08 DIAGNOSIS — Z8673 Personal history of transient ischemic attack (TIA), and cerebral infarction without residual deficits: Secondary | ICD-10-CM

## 2016-05-08 DIAGNOSIS — E871 Hypo-osmolality and hyponatremia: Secondary | ICD-10-CM

## 2016-05-08 DIAGNOSIS — E1122 Type 2 diabetes mellitus with diabetic chronic kidney disease: Secondary | ICD-10-CM | POA: Diagnosis present

## 2016-05-08 DIAGNOSIS — M79609 Pain in unspecified limb: Secondary | ICD-10-CM | POA: Diagnosis not present

## 2016-05-08 DIAGNOSIS — I5033 Acute on chronic diastolic (congestive) heart failure: Secondary | ICD-10-CM

## 2016-05-08 DIAGNOSIS — Z79899 Other long term (current) drug therapy: Secondary | ICD-10-CM

## 2016-05-08 DIAGNOSIS — Z86718 Personal history of other venous thrombosis and embolism: Secondary | ICD-10-CM | POA: Diagnosis not present

## 2016-05-08 DIAGNOSIS — R531 Weakness: Secondary | ICD-10-CM

## 2016-05-08 DIAGNOSIS — Z931 Gastrostomy status: Secondary | ICD-10-CM

## 2016-05-08 DIAGNOSIS — E039 Hypothyroidism, unspecified: Secondary | ICD-10-CM | POA: Diagnosis not present

## 2016-05-08 DIAGNOSIS — K219 Gastro-esophageal reflux disease without esophagitis: Secondary | ICD-10-CM | POA: Diagnosis present

## 2016-05-08 DIAGNOSIS — R262 Difficulty in walking, not elsewhere classified: Secondary | ICD-10-CM

## 2016-05-08 DIAGNOSIS — F329 Major depressive disorder, single episode, unspecified: Secondary | ICD-10-CM | POA: Diagnosis present

## 2016-05-08 DIAGNOSIS — Z87891 Personal history of nicotine dependence: Secondary | ICD-10-CM

## 2016-05-08 DIAGNOSIS — T85598A Other mechanical complication of other gastrointestinal prosthetic devices, implants and grafts, initial encounter: Secondary | ICD-10-CM

## 2016-05-08 DIAGNOSIS — Z961 Presence of intraocular lens: Secondary | ICD-10-CM | POA: Diagnosis present

## 2016-05-08 DIAGNOSIS — D649 Anemia, unspecified: Secondary | ICD-10-CM | POA: Diagnosis present

## 2016-05-08 DIAGNOSIS — R0602 Shortness of breath: Secondary | ICD-10-CM | POA: Diagnosis not present

## 2016-05-08 DIAGNOSIS — K9423 Gastrostomy malfunction: Secondary | ICD-10-CM | POA: Diagnosis not present

## 2016-05-08 DIAGNOSIS — R05 Cough: Secondary | ICD-10-CM | POA: Diagnosis not present

## 2016-05-08 LAB — NA AND K (SODIUM & POTASSIUM), RAND UR
Potassium Urine: 18 mmol/L
Sodium, Ur: 73 mmol/L

## 2016-05-08 LAB — BASIC METABOLIC PANEL
Anion gap: 11 (ref 5–15)
BUN/Creatinine Ratio: 33 — ABNORMAL HIGH (ref 12–28)
BUN: 53 mg/dL — ABNORMAL HIGH (ref 10–36)
BUN: 56 mg/dL — ABNORMAL HIGH (ref 6–20)
CO2: 26 mmol/L (ref 18–29)
CO2: 26 mmol/L (ref 22–32)
Calcium: 9.6 mg/dL (ref 8.7–10.3)
Calcium: 9.6 mg/dL (ref 8.9–10.3)
Chloride: 81 mmol/L — ABNORMAL LOW (ref 96–106)
Chloride: 86 mmol/L — ABNORMAL LOW (ref 101–111)
Creatinine, Ser: 1.59 mg/dL — ABNORMAL HIGH (ref 0.57–1.00)
Creatinine, Ser: 1.63 mg/dL — ABNORMAL HIGH (ref 0.44–1.00)
GFR calc Af Amer: 33 mL/min/{1.73_m2} — ABNORMAL LOW (ref >59)
GFR calc Af Amer: 31 mL/min — ABNORMAL LOW (ref >60)
GFR calc non Af Amer: 28 mL/min/{1.73_m2} — ABNORMAL LOW (ref >59)
GFR calc non Af Amer: 27 mL/min — ABNORMAL LOW (ref >60)
Glucose, Bld: 127 mg/dL — ABNORMAL HIGH (ref 65–99)
Glucose: 121 mg/dL — ABNORMAL HIGH (ref 65–99)
Potassium: 4.6 mmol/L (ref 3.5–5.2)
Potassium: 4.3 mmol/L (ref 3.5–5.1)
Sodium: 121 mmol/L — ABNORMAL LOW (ref 134–144)
Sodium: 123 mmol/L — ABNORMAL LOW (ref 135–145)

## 2016-05-08 LAB — CBC
HCT: 30.2 % — ABNORMAL LOW (ref 36.0–46.0)
Hemoglobin: 10.2 g/dL — ABNORMAL LOW (ref 12.0–15.0)
MCH: 27.3 pg (ref 26.0–34.0)
MCHC: 33.8 g/dL (ref 30.0–36.0)
MCV: 81 fL (ref 78.0–100.0)
Platelets: 301 10*3/uL (ref 150–400)
RBC: 3.73 MIL/uL — ABNORMAL LOW (ref 3.87–5.11)
RDW: 18.2 % — ABNORMAL HIGH (ref 11.5–15.5)
WBC: 6.4 10*3/uL (ref 4.0–10.5)

## 2016-05-08 LAB — URINALYSIS, COMPLETE (UACMP) WITH MICROSCOPIC
Bilirubin Urine: NEGATIVE
Glucose, UA: NEGATIVE mg/dL
Ketones, ur: NEGATIVE mg/dL
Nitrite: NEGATIVE
Protein, ur: NEGATIVE mg/dL
Specific Gravity, Urine: 1.009 (ref 1.005–1.030)
pH: 8 (ref 5.0–8.0)

## 2016-05-08 LAB — PROTIME-INR
INR: 1.4
Prothrombin Time: 17.3 seconds — ABNORMAL HIGH (ref 11.4–15.2)

## 2016-05-08 LAB — OSMOLALITY: Osmolality: 279 mOsm/kg (ref 275–295)

## 2016-05-08 LAB — RESPIRATORY PANEL BY PCR

## 2016-05-08 LAB — TSH: TSH: 7.468 u[IU]/mL — ABNORMAL HIGH (ref 0.350–4.500)

## 2016-05-08 LAB — I-STAT TROPONIN, ED: Troponin i, poc: 0.02 ng/mL (ref 0.00–0.08)

## 2016-05-08 LAB — MRSA PCR SCREENING: MRSA by PCR: NEGATIVE

## 2016-05-08 LAB — I-STAT CG4 LACTIC ACID, ED: Lactic Acid, Venous: 1.47 mmol/L (ref 0.5–1.9)

## 2016-05-08 LAB — STREP PNEUMONIAE URINARY ANTIGEN: Strep Pneumo Urinary Antigen: NEGATIVE

## 2016-05-08 LAB — GLUCOSE, CAPILLARY: Glucose-Capillary: 163 mg/dL — ABNORMAL HIGH (ref 65–99)

## 2016-05-08 LAB — BRAIN NATRIURETIC PEPTIDE: B Natriuretic Peptide: 80.9 pg/mL (ref 0.0–100.0)

## 2016-05-08 LAB — OSMOLALITY, URINE: Osmolality, Ur: 280 mOsm/kg — ABNORMAL LOW (ref 300–900)

## 2016-05-08 LAB — PRO B NATRIURETIC PEPTIDE: NT-Pro BNP: 1132 pg/mL — ABNORMAL HIGH (ref 0–738)

## 2016-05-08 MED ORDER — FUROSEMIDE 10 MG/ML IJ SOLN
40.0000 mg | Freq: Two times a day (BID) | INTRAMUSCULAR | Status: DC
  Administered 2016-05-08 – 2016-05-10 (×4): 40 mg via INTRAVENOUS
  Filled 2016-05-08 (×4): qty 4

## 2016-05-08 MED ORDER — DILTIAZEM HCL ER COATED BEADS 120 MG PO CP24
120.0000 mg | ORAL_CAPSULE | Freq: Every day | ORAL | Status: DC
Start: 2016-05-09 — End: 2016-05-17
  Administered 2016-05-09 – 2016-05-17 (×9): 120 mg via ORAL
  Filled 2016-05-08 (×9): qty 1

## 2016-05-08 MED ORDER — GABAPENTIN 300 MG PO CAPS
300.0000 mg | ORAL_CAPSULE | Freq: Every day | ORAL | Status: DC
  Administered 2016-05-08 – 2016-05-16 (×9): 300 mg via ORAL
  Filled 2016-05-08 (×9): qty 1

## 2016-05-08 MED ORDER — CALCIUM CARBONATE-VITAMIN D 500-200 MG-UNIT PO TABS
1.0000 | ORAL_TABLET | Freq: Every day | ORAL | Status: DC
  Administered 2016-05-09 – 2016-05-17 (×9): 1 via ORAL
  Filled 2016-05-08 (×9): qty 1

## 2016-05-08 MED ORDER — OSMOLITE 1.5 CAL PO LIQD
1000.0000 mL | ORAL | Status: DC
Start: 2016-05-08 — End: 2016-05-09
  Administered 2016-05-08: 1000 mL
  Filled 2016-05-08 (×3): qty 1000

## 2016-05-08 MED ORDER — LEVOTHYROXINE SODIUM 137 MCG PO TABS
137.0000 ug | ORAL_TABLET | Freq: Every day | ORAL | Status: DC
  Administered 2016-05-09 – 2016-05-17 (×9): 137 ug via ORAL
  Filled 2016-05-08 (×10): qty 1

## 2016-05-08 MED ORDER — BENZONATATE 100 MG PO CAPS
200.0000 mg | ORAL_CAPSULE | Freq: Three times a day (TID) | ORAL | Status: DC | PRN
  Administered 2016-05-09 – 2016-05-17 (×3): 200 mg via ORAL
  Filled 2016-05-08 (×3): qty 2

## 2016-05-08 MED ORDER — LORATADINE 10 MG PO TABS
10.0000 mg | ORAL_TABLET | Freq: Every day | ORAL | Status: DC
  Administered 2016-05-09 – 2016-05-17 (×9): 10 mg via ORAL
  Filled 2016-05-08 (×9): qty 1

## 2016-05-08 MED ORDER — BISACODYL 10 MG RE SUPP: 10.0000 mg | Freq: Every day | RECTAL | Status: DC | PRN

## 2016-05-08 MED ORDER — WARFARIN - PHARMACIST DOSING INPATIENT
Freq: Every day | Status: DC
  Administered 2016-05-09: 18:00:00

## 2016-05-08 MED ORDER — DEXTROSE 5 % IV SOLN
2.0000 g | Freq: Once | INTRAVENOUS | Status: AC
  Administered 2016-05-08: 2 g via INTRAVENOUS
  Filled 2016-05-08: qty 2

## 2016-05-08 MED ORDER — SODIUM CHLORIDE 0.9 % IV BOLUS (SEPSIS)
500.0000 mL | Freq: Once | INTRAVENOUS | Status: AC
  Administered 2016-05-08: 500 mL via INTRAVENOUS

## 2016-05-08 MED ORDER — VANCOMYCIN HCL IN DEXTROSE 750-5 MG/150ML-% IV SOLN: 750.0000 mg | INTRAVENOUS | Status: DC

## 2016-05-08 MED ORDER — B COMPLEX PO TABS: 1.0000 | ORAL_TABLET | Freq: Every day | ORAL | Status: DC

## 2016-05-08 MED ORDER — DEXTROSE 5 % IV SOLN
1.0000 g | INTRAVENOUS | Status: AC
  Administered 2016-05-09 – 2016-05-14 (×6): 1 g via INTRAVENOUS
  Filled 2016-05-08 (×7): qty 10

## 2016-05-08 MED ORDER — IPRATROPIUM-ALBUTEROL 0.5-2.5 (3) MG/3ML IN SOLN
3.0000 mL | Freq: Four times a day (QID) | RESPIRATORY_TRACT | Status: DC
  Administered 2016-05-08 – 2016-05-09 (×4): 3 mL via RESPIRATORY_TRACT
  Filled 2016-05-08 (×4): qty 3

## 2016-05-08 MED ORDER — LATANOPROST 0.005 % OP SOLN
1.0000 [drp] | Freq: Every day | OPHTHALMIC | Status: DC
  Administered 2016-05-08 – 2016-05-16 (×9): 1 [drp] via OPHTHALMIC
  Filled 2016-05-08: qty 2.5

## 2016-05-08 MED ORDER — FLUTICASONE PROPIONATE 50 MCG/ACT NA SUSP
2.0000 | Freq: Every day | NASAL | Status: DC
  Administered 2016-05-09 – 2016-05-17 (×9): 2 via NASAL
  Filled 2016-05-08: qty 16

## 2016-05-08 MED ORDER — WARFARIN SODIUM 5 MG PO TABS
5.0000 mg | ORAL_TABLET | Freq: Once | ORAL | Status: AC
  Filled 2016-05-08: qty 1

## 2016-05-08 MED ORDER — ISOSORBIDE MONONITRATE ER 30 MG PO TB24
30.0000 mg | ORAL_TABLET | Freq: Every day | ORAL | Status: DC
  Administered 2016-05-09 – 2016-05-17 (×9): 30 mg via ORAL
  Filled 2016-05-08 (×9): qty 1

## 2016-05-08 MED ORDER — SODIUM CHLORIDE 0.9 % IV SOLN
1500.0000 mg | Freq: Once | INTRAVENOUS | Status: AC
  Administered 2016-05-08: 1500 mg via INTRAVENOUS
  Filled 2016-05-08: qty 1500

## 2016-05-08 MED ORDER — VANCOMYCIN HCL IN DEXTROSE 1-5 GM/200ML-% IV SOLN
1000.0000 mg | Freq: Once | INTRAVENOUS | Status: DC
  Filled 2016-05-08: qty 200

## 2016-05-08 MED ORDER — SERTRALINE HCL 100 MG PO TABS
100.0000 mg | ORAL_TABLET | Freq: Every day | ORAL | Status: DC
  Administered 2016-05-09 – 2016-05-17 (×9): 100 mg via ORAL
  Filled 2016-05-08 (×9): qty 1

## 2016-05-08 MED ORDER — GLUCERNA SHAKE PO LIQD
237.0000 mL | Freq: Two times a day (BID) | ORAL | Status: DC
  Administered 2016-05-09 – 2016-05-15 (×11): 237 mL via ORAL

## 2016-05-08 MED ORDER — ADULT MULTIVITAMIN W/MINERALS CH
1.0000 | ORAL_TABLET | Freq: Every day | ORAL | Status: DC
  Administered 2016-05-09 – 2016-05-17 (×9): 1 via ORAL
  Filled 2016-05-08 (×9): qty 1

## 2016-05-08 MED ORDER — DEXTROSE 5 % IV SOLN: 1.0000 g | INTRAVENOUS | Status: DC

## 2016-05-08 MED ORDER — DEXTROSE 5 % IV SOLN
500.0000 mg | INTRAVENOUS | Status: AC
  Administered 2016-05-09 – 2016-05-14 (×5): 500 mg via INTRAVENOUS
  Filled 2016-05-08 (×7): qty 500

## 2016-05-08 MED ORDER — ACETAMINOPHEN 325 MG PO TABS: 650.0000 mg | ORAL_TABLET | Freq: Two times a day (BID) | ORAL | Status: DC | PRN

## 2016-05-08 MED ORDER — ATORVASTATIN CALCIUM 10 MG PO TABS
10.0000 mg | ORAL_TABLET | Freq: Every day | ORAL | Status: DC
  Administered 2016-05-09 – 2016-05-17 (×9): 10 mg via ORAL
  Filled 2016-05-08 (×9): qty 1

## 2016-05-08 MED ORDER — CYCLOSPORINE 0.05 % OP EMUL
1.0000 [drp] | Freq: Two times a day (BID) | OPHTHALMIC | Status: DC
  Administered 2016-05-08 – 2016-05-17 (×18): 1 [drp] via OPHTHALMIC
  Filled 2016-05-08 (×20): qty 1

## 2016-05-08 MED ORDER — AMIODARONE HCL 200 MG PO TABS
200.0000 mg | ORAL_TABLET | Freq: Every day | ORAL | Status: DC
  Administered 2016-05-09 – 2016-05-11 (×3): 200 mg via ORAL
  Filled 2016-05-08 (×3): qty 1

## 2016-05-08 MED ORDER — POLYVINYL ALCOHOL 1.4 % OP SOLN
1.0000 [drp] | Freq: Four times a day (QID) | OPHTHALMIC | Status: DC
  Administered 2016-05-08 – 2016-05-17 (×32): 1 [drp] via OPHTHALMIC
  Filled 2016-05-08: qty 15

## 2016-05-08 MED ORDER — HYDRALAZINE HCL 50 MG PO TABS
50.0000 mg | ORAL_TABLET | Freq: Three times a day (TID) | ORAL | Status: DC
  Administered 2016-05-08 – 2016-05-17 (×18): 50 mg via ORAL
  Filled 2016-05-08 (×25): qty 1

## 2016-05-08 MED ORDER — ALBUTEROL SULFATE (2.5 MG/3ML) 0.083% IN NEBU
2.5000 mg | INHALATION_SOLUTION | Freq: Four times a day (QID) | RESPIRATORY_TRACT | Status: DC | PRN
  Administered 2016-05-10 – 2016-05-14 (×3): 2.5 mg via RESPIRATORY_TRACT
  Filled 2016-05-08 (×3): qty 3

## 2016-05-08 MED ORDER — CHLORHEXIDINE GLUCONATE 0.12 % MT SOLN
15.0000 mL | Freq: Two times a day (BID) | OROMUCOSAL | Status: DC
Start: 2016-05-08 — End: 2016-05-17
  Administered 2016-05-08 – 2016-05-17 (×18): 15 mL via OROMUCOSAL
  Filled 2016-05-08 (×19): qty 15

## 2016-05-08 MED ORDER — TRAMADOL HCL 50 MG PO TABS
50.0000 mg | ORAL_TABLET | Freq: Every day | ORAL | Status: DC | PRN
  Administered 2016-05-09: 50 mg via ORAL
  Filled 2016-05-08: qty 1

## 2016-05-08 MED ORDER — FREE WATER: 230.0000 mL | Freq: Three times a day (TID) | Status: DC

## 2016-05-08 MED ORDER — POLYETHYL GLYCOL-PROPYL GLYCOL 0.4-0.3 % OP SOLN: 1.0000 [drp] | Freq: Four times a day (QID) | OPHTHALMIC | Status: DC

## 2016-05-08 MED ORDER — PANTOPRAZOLE SODIUM 40 MG PO TBEC
40.0000 mg | DELAYED_RELEASE_TABLET | Freq: Two times a day (BID) | ORAL | Status: DC
  Administered 2016-05-08 – 2016-05-17 (×18): 40 mg via ORAL
  Filled 2016-05-08 (×18): qty 1

## 2016-05-08 MED ORDER — LEVALBUTEROL TARTRATE 45 MCG/ACT IN AERO: 2.0000 | INHALATION_SPRAY | Freq: Four times a day (QID) | RESPIRATORY_TRACT | Status: DC | PRN

## 2016-05-08 NOTE — ED Notes (Signed)
Dr. Steinl at bedside 

## 2016-05-08 NOTE — ED Notes (Signed)
Soledad Gerlach (daughter) can be reached at [phone] (home) or [phone] (cell)

## 2016-05-08 NOTE — H&P (Signed)
History and Physical    Shannon [name] BZJ:[id number] DOB: [birth year] DOA: 05/08/2016  PCP: Kirt Boys, DO  Patient coming from:Home Chief Complaint: SOB, cough  HPI: Shannon [name] is a 83 y.o. female with medical history significant of persistent atrial fibrillation on amiodarone, bradycardia, CKD stage III,hypothyroidism,non-obstructive coronary artery disease, type 2 diabetes, hypertension, chronic diastolic CHF, stroke, cerebrovascular disease, COPD, asthma, gastric outlet obstruction s/p prior J tube, anemia and hyponatremia, presented with shortness of breath and cough for worsening for about 2 weeks. Patient was seen at cardiology clinic today for follow-up and was instructed to come to ER for admission. Patient reported that her breathing is getting worse for last 2 weeks associated with productive cough. She has white is to brownish phlegm. Has headache and occasional sore throat but denied runny nose, sick contact. She was hospitalized about 2 months ago. Denied fever, chest pain, nausea, vomiting. No diarrhea or constipation. Patient also reported feeling weak gradually worsening.  ED Course: In the ER patient was found to have hyponatremia with serum sodium of 04/22/1946. Cultures were sent. Chest x-ray with possible pneumonia and treated with vancomycin and cefepime. Admitted for further evaluation.  Review of Systems: As per HPI otherwise 10 point review of systems negative.    Past Medical History:  Diagnosis Date  . Anemia    previous blood transfusions  . Arthritis    "all over"  . Asthma   . Bradycardia    requiring previous d/c of BB and reduction of amiodarone  . CAD (coronary artery disease)    nonobstructive per notes  . Chronic diastolic CHF (congestive heart failure) (HCC)   . CKD (chronic kidney disease), stage III   . Complication of blood transfusion    "got the wrong blood type at [facility] in ~ 2015; no adverse reaction that we are aware of"/daughter, [name]  (01/27/2016)  . COPD (chronic obstructive pulmonary disease) (HCC)   . Depression    "light case"  . DVT (deep venous thrombosis) (HCC) 01/2016   a. LLE DVT 01/2016 - switched from Eliquis to Coumadin.  . Gastric stenosis    a. s/p stomach tube  . GERD (gastroesophageal reflux disease)   . History of blood transfusion    "several" (01/27/2016)  . History of stomach ulcers   . Hyperlipidemia   . Hypertension   . Hypothyroidism   . Paraesophageal hernia   . Perforated gastric ulcer (HCC)   . Seasonal allergies   . SIADH (syndrome of inappropriate ADH production) (HCC)    [name] 01/10/2015  . Small bowel obstruction    "I don't know how many" (01/11/2015)  . Stroke (HCC)    "light one"  . Type II diabetes mellitus (HCC)    "related to prednisone use  for > 20 yr; once predinose stopped; no more DM RX" (01/27/2016)  . Ventral hernia with bowel obstruction     Past Surgical History:  Procedure Laterality Date  . CATARACT EXTRACTION W/ INTRAOCULAR LENS  IMPLANT, BILATERAL    . CHOLECYSTECTOMY OPEN    . COLECTOMY    . ESOPHAGOGASTRODUODENOSCOPY N/A 01/19/2014   Procedure: ESOPHAGOGASTRODUODENOSCOPY (EGD);  Surgeon: Hilarie Fredrickson, MD;  Location: [facility];  Service: Endoscopy;  Laterality: N/A;  . ESOPHAGOGASTRODUODENOSCOPY N/A 01/20/2014   Procedure: ESOPHAGOGASTRODUODENOSCOPY (EGD);  Surgeon: Hilarie Fredrickson, MD;  Location: [facility];  Service: Endoscopy;  Laterality: N/A;  . ESOPHAGOGASTRODUODENOSCOPY N/A 03/19/2014   Procedure: ESOPHAGOGASTRODUODENOSCOPY (EGD);  Surgeon: Rachael Fee, MD;  Location: [facility];  Service: Endoscopy;  Laterality: N/A;  . ESOPHAGOGASTRODUODENOSCOPY N/A 07/08/2015   Procedure: ESOPHAGOGASTRODUODENOSCOPY (EGD);  Surgeon: Sherrilyn Rist, MD;  Location: [facility] ENDOSCOPY;  Service: Endoscopy;  Laterality: N/A;  . ESOPHAGOGASTRODUODENOSCOPY (EGD) WITH PROPOFOL N/A 09/15/2015   Procedure: ESOPHAGOGASTRODUODENOSCOPY (EGD) WITH PROPOFOL;  Surgeon: Ruffin Frederick, MD;  Location: [facility] ENDOSCOPY;  Service: Gastroenterology;  Laterality: N/A;  . GASTROJEJUNOSTOMY     hx/notes 01/10/2015  . GASTROJEJUNOSTOMY N/A 09/23/2015   Procedure: OPEN GASTROJEJUNOSTOMY TUBE PLACEMENT;  Surgeon: De Blanch Kinsinger, MD;  Location: [facility] ORS;  Service: General;  Laterality: N/A;  . GLAUCOMA SURGERY Bilateral   . HERNIA REPAIR  2015  . IR GENERIC HISTORICAL  01/07/2016   IR GJ TUBE CHANGE 01/07/2016 Malachy Moan, MD [facility]  . IR GENERIC HISTORICAL  01/27/2016   IR MECH REMOV OBSTRUC MAT ANY COLON TUBE W/FLUORO 01/27/2016 Richarda Overlie, MD [facility]  . IR GENERIC HISTORICAL  02/07/2016   IR PATIENT EVAL TECH 0-60 MINS Darrell K Allred, PA-C [facility]  . IR GENERIC HISTORICAL  02/08/2016   IR GJ TUBE CHANGE 02/08/2016 Berdine Dance, MD [facility]  . IR GENERIC HISTORICAL  01/06/2016   IR GJ TUBE CHANGE 01/06/2016 [facility] OUT REF  . IR GENERIC HISTORICAL  05/02/2016   IR CM INJ ANY COLONIC TUBE W/FLUORO 05/02/2016 Oley Balm, MD [facility]  . LAPAROTOMY N/A 01/20/2015   Procedure: EXPLORATORY LAPAROTOMY;  Surgeon: Abigail Miyamoto, MD;  Location: [facility] OR;  Service: General;  Laterality: N/A;  . LYSIS OF ADHESION N/A 01/20/2015   Procedure: LYSIS OF ADHESIONS < 1 HOUR;  Surgeon: Abigail Miyamoto, MD;  Location: [facility] OR;  Service: General;  Laterality: N/A;  . TONSILLECTOMY    . TUBAL LIGATION    . VENTRAL HERNIA REPAIR  2015   incarcerated ventral hernia ([facility] 09/2013)/notes 01/10/2015    Social history: reports that she has never smoked. She has quit using smokeless tobacco. Her smokeless tobacco use included Snuff. She reports that she does not drink alcohol or use drugs.  Allergies  Allergen Reactions  . Penicillins Itching and Rash    Has patient had a PCN reaction causing immediate rash, facial/tongue/throat swelling, SOB or lightheadedness with hypotension: Yes Has patient had a PCN reaction causing severe rash involving mucus membranes  or skin necrosis: Unk Has patient had a PCN reaction that required hospitalization: Unk Has patient had a PCN reaction occurring within the last 10 years: Unk If all of the above answers are "NO", then may proceed with Cephalosporin use.     Family History  Problem Relation Age of Onset  . Stroke Mother   . Hypertension Mother   . Diabetes Brother   . Heart attack Neg Hx      Prior to Admission medications   Medication Sig Start Date End Date Taking? Authorizing Provider  acetaminophen (TYLENOL) 325 MG tablet Take 650 mg by mouth 2 (two) times daily as needed for mild pain.    Yes Historical Provider, MD  ADVAIR DISKUS 250-50 MCG/DOSE AEPB INHALE 1 PUFF INTO THE LUNGS 2 (TWO) TIMES DAILY AS NEEDED FOR SHORTNESS OF BREATH 12/01/14  Yes Historical Provider, MD  AMINO ACIDS-PROTEIN HYDROLYS PO Take 30 mLs by mouth 2 (two) times daily.   Yes Historical Provider, MD  amiodarone (PACERONE) 200 MG tablet Take 200 mg by mouth daily.   Yes Historical Provider, MD  atorvastatin (LIPITOR) 10 MG tablet Take 10 mg by mouth daily.   Yes Historical Provider, MD  b complex vitamins tablet Take 1 tablet by mouth daily.    Yes Historical Provider, MD  benzonatate (TESSALON) 200 MG capsule Take 1 capsule (200 mg total) by mouth 3 (three) times daily as needed for cough. 03/24/16  Yes Kirt Boys, DO  calcium-vitamin D (OSCAL WITH D) 500-200 MG-UNIT per tablet Take 1 tablet by mouth daily with breakfast.    Yes Historical Provider, MD  cetirizine (ZYRTEC) 10 MG tablet Take 10 mg by mouth daily as needed for allergies or rhinitis.   Yes Historical Provider, MD  chlorhexidine (PERIDEX) 0.12 % solution Use as directed 15 mLs in the mouth or throat 2 (two) times daily.   Yes Historical Provider, MD  diltiazem (CARDIZEM CD) 120 MG 24 hr capsule Take 1 capsule (120 mg total) by mouth daily. 02/23/16 05/23/16 Yes Hillis Range, MD  feeding supplement, GLUCERNA SHAKE, (GLUCERNA SHAKE) LIQD Take 237 mLs by mouth See  admin instructions. One to two times a day   Yes Historical Provider, MD  fluticasone (FLONASE) 50 MCG/ACT nasal spray Place 2 sprays into both nostrils daily. 08/07/14  Yes Kirt Boys, DO  furosemide (LASIX) 40 MG tablet Take 40 mg by mouth 2 (two) times daily.   Yes Historical Provider, MD  gabapentin (NEURONTIN) 300 MG capsule Take 1 capsule (300 mg total) by mouth 3 (three) times daily. 02/02/16  Yes Little Ishikawa, NP  hydrALAZINE (APRESOLINE) 50 MG tablet Take 1 tablet (50 mg total) by mouth 3 (three) times daily. 11/11/15  Yes Rodolph Bong, MD  HYDROcodone-acetaminophen (NORCO) 5-325 MG tablet Take 1 tablet by mouth every 6 (six) hours as needed for moderate pain. 02/18/16  Yes Monica Carter, DO  ipratropium-albuterol (DUONEB) 0.5-2.5 (3) MG/3ML SOLN Take 3 mLs by nebulization every 6 (six) hours as needed (sob). Use 3 times daily x 4 days then every 6 hours as needed. Patient taking differently: Take 3 mLs by nebulization See admin instructions. Every 4 to 6 hours (up to 6 times a day) 03/21/16  Yes Janetta Hora, PA-C  isosorbide mononitrate (IMDUR) 30 MG 24 hr tablet TAKE 1 TABLET (30 MG TOTAL) BY MOUTH DAILY. 04/13/16  Yes Lars Masson, MD  latanoprost (XALATAN) 0.005 % ophthalmic solution Place 1 drop into both eyes at bedtime. 06/02/14  Yes Mahima Glade Lloyd, MD  levalbuterol [facility] HFA) 45 MCG/ACT inhaler Inhale 2 puffs into the lungs every 6 (six) hours as needed for wheezing or shortness of breath. 02/03/16  Yes Kirt Boys, DO  levothyroxine (SYNTHROID, LEVOTHROID) 137 MCG tablet TAKE 1 TABLET (137 MCG TOTAL) BY MOUTH DAILY BEFORE BREAKFAST. 04/04/16  Yes Kirt Boys, DO  Maltodextrin-Xanthan Gum (RESOURCE THICKENUP CLEAR) POWD Take 120 g by mouth as needed (use to thicken liquids.). 11/11/15  Yes Rodolph Bong, MD  Multiple Vitamin (MULTIVITAMIN WITH MINERALS) TABS tablet Take 1 tablet by mouth daily.   Yes Historical Provider, MD  Nutritional Supplements (FEEDING  SUPPLEMENT, OSMOLITE 1.5 CAL,) LIQD Place 1,000 mLs into feeding tube daily. 09/30/15  Yes Calvert Cantor, MD  pantoprazole (PROTONIX) 40 MG tablet TAKE 1 TABLET BY MOUTH TWICE A DAY 03/09/16  Yes Kirt Boys, DO  Polyethyl Glycol-Propyl Glycol 0.4-0.3 % SOLN Place 1 drop into both eyes 4 (four) times daily.    Yes Historical Provider, MD  RESTASIS 0.05 % ophthalmic emulsion USE 1 DROP INTO BOTH EYES TWICE DAILY 11/01/15  Yes Kimber Relic, MD  sertraline (ZOLOFT) 100 MG tablet TAKE ONE TABLET BY MOUTH ONCE DAILY FOR DEPRESSION  04/10/16  Yes Kirt Boys, DO  traMADol (ULTRAM) 50 MG tablet Take 50 mg by mouth daily as needed for moderate pain.    Yes Historical Provider, MD  warfarin (COUMADIN) 2.5 MG tablet Take as directed by Coumadin Clinic. Patient taking differently: Take 2.5-3.75 mg by mouth See admin instructions. 3.75 mg at 1800 on Sun/Tues/Wed/Thurs/Fri/Sat and 2.5 mg on Mon 02/16/16  Yes Lars Masson, MD  Water For Irrigation, Sterile (FREE WATER) SOLN Place 230 mLs into feeding tube 3 (three) times daily.   Yes Historical Provider, MD  bisacodyl (DULCOLAX) 10 MG suppository Place 1 suppository (10 mg total) rectally daily as needed for moderate constipation. Patient not taking: Reported on 05/08/2016 09/30/15   Calvert Cantor, MD  feeding supplement (BOOST / RESOURCE BREEZE) LIQD Take 1 Container by mouth daily. Patient not taking: Reported on 05/08/2016 11/11/15   Rodolph Bong, MD  polyethylene glycol powder (GLYCOLAX/MIRALAX) powder MIX 17GRAMS IN 8 OUNCES OF LIQUID AND DRINK TWICE DAILY AS NEEDED Patient not taking: Reported on 05/08/2016 09/13/15   Kirt Boys, DO    Physical Exam: Vitals:   05/08/16 1515 05/08/16 1530 05/08/16 1600 05/08/16 1615  BP: 125/65 133/76 129/70 133/71  Pulse: 91 97 91 88  Resp: 20 23 20 20   Temp:      TempSrc:      SpO2: 98% 100% 96% 95%  Weight:          Constitutional: Ill-looking elderly female lying on bed Vitals:   05/08/16 1515 05/08/16  1530 05/08/16 1600 05/08/16 1615  BP: 125/65 133/76 129/70 133/71  Pulse: 91 97 91 88  Resp: 20 23 20 20   Temp:      TempSrc:      SpO2: 98% 100% 96% 95%  Weight:       Eyes: PERRL, ENMT: Mucous membranes are moist.  Neck: normal, supple Respiratory: Coarse breath sounds bilaterally, intermittent wheezing. Mild increased work of breathing  Cardiovascular: Regular rate and rhythm, no murmurs / rubs / gallops. Trace lower extremity edema. Abdomen: Soft, nontender. Bowel sound positive Musculoskeletal: no clubbing / cyanosis. No joint deformity upper and lower extremities.  Skin: no rashes, lesions, ulcers. No induration Neurologic: Alert, awake, following commands.   Labs on Admission: I have personally reviewed following labs and imaging studies  CBC:  Recent Labs Lab 05/08/16 1241  WBC 6.4  HGB 10.2*  HCT 30.2*  MCV 81.0  PLT 301   Basic Metabolic Panel:  Recent Labs Lab 05/08/16 1053 05/08/16 1241  NA 121* 123*  K 4.6 4.3  CL 81* 86*  CO2 26 26  GLUCOSE 121* 127*  BUN 53* 56*  CREATININE 1.59* 1.63*  CALCIUM 9.6 9.6   GFR: Estimated Creatinine Clearance: 21.5 mL/min (by C-G formula based on SCr of 1.63 mg/dL (H)). Liver Function Tests: No results for input(s): AST, ALT, ALKPHOS, BILITOT, PROT, ALBUMIN in the last 168 hours. No results for input(s): LIPASE, AMYLASE in the last 168 hours. No results for input(s): AMMONIA in the last 168 hours. Coagulation Profile:  Recent Labs Lab 05/08/16 1642  INR 1.40   Cardiac Enzymes: No results for input(s): CKTOTAL, CKMB, CKMBINDEX, TROPONINI in the last 168 hours. BNP (last 3 results)  Recent Labs  05/08/16 1053  PROBNP 1,132*   HbA1C: No results for input(s): HGBA1C in the last 72 hours. CBG: No results for input(s): GLUCAP in the last 168 hours. Lipid Profile: No results for input(s): CHOL, HDL, LDLCALC, TRIG, CHOLHDL, LDLDIRECT in the last 72  hours. Thyroid Function Tests: No results for input(s):  TSH, T4TOTAL, FREET4, T3FREE, THYROIDAB in the last 72 hours. Anemia Panel: No results for input(s): VITAMINB12, FOLATE, FERRITIN, TIBC, IRON, RETICCTPCT in the last 72 hours. Urine analysis:    Component Value Date/Time   COLORURINE AMBER (A) 05/08/2016 1652   APPEARANCEUR CLOUDY (A) 05/08/2016 1652   LABSPEC 1.009 05/08/2016 1652   PHURINE 8.0 05/08/2016 1652   GLUCOSEU NEGATIVE 05/08/2016 1652   HGBUR SMALL (A) 05/08/2016 1652   BILIRUBINUR NEGATIVE 05/08/2016 1652   KETONESUR NEGATIVE 05/08/2016 1652   PROTEINUR NEGATIVE 05/08/2016 1652   UROBILINOGEN 0.2 01/27/2015 2308   NITRITE NEGATIVE 05/08/2016 1652   LEUKOCYTESUR LARGE (A) 05/08/2016 1652   Sepsis Labs: !!!!!!!!!!!!!!!!!!!!!!!!!!!!!!!!!!!!!!!!!!!! @LABRCNTIP (procalcitonin:4,lacticidven:4) )No results found for this or any previous visit (from the past 240 hour(s)).   Radiological Exams on Admission: Dg Chest 2 View  Result Date: 05/08/2016 CLINICAL DATA:  Cough and shortness of breath over the last week. EXAM: CHEST  2 VIEW COMPARISON:  04/13/2016 FINDINGS: Heart size is normal. There is aortic atherosclerosis. There is newly seen basilar volume loss, left more than right. This is consistent with mild basilar pneumonia. Upper lungs are clear. No effusions. No acute bone finding. IMPRESSION: Newly seen volume loss at both lung bases most consistent with mild atelectatic pneumonia. Aortic atherosclerosis. Electronically Signed   By: Paulina Fusi M.D.   On: 05/08/2016 13:05    EKG: Independently reviewed. Atrial flutter, nonspecific T-wave changes.  Assessment/Plan Active Problems:   CAP (community acquired pneumonia)  # Possible community acquired pneumonia: -Patient lives at home with her daughter. The last hospital admission was about 2 months ago. Chest x-ray consistent with bibasal atelectasis/pneumonia. Received IV vancomycin and cefepime in Er. -I will start ceftriaxone and azithromycin. -Check pro-calcitonin  level, respiratory viral panel, droplet precaution -Patient is not febrile, not hypoxic and has no leukocytosis. -Continue supportive care. -Follow up culture results sent from the ER  #Likely acute on chronic diastolic congestive heart failure: Patient with elevated BNP at the cardiology clinic 1132. Patient with productive cough and dyspnea on exertion and trace lower extremity edema. Patient also with hyponatremia. -I will start Lasix 40 IV twice a day, strict ins and outs, low-salt diet -Repeat echocardiogram -Patient has no chest pain, troponin not elevated. -Continue Imdur, hydralazine -May consider cardiology consult in the morning.  #Paroxysmal atrial fibrillation: Continue amiodarone, diltiazem.  -INR subtherapeutic with 1.4. Continue Coumadin, pharmacy consulted  -Monitor heart rate.  #Acute on chronic hyponatremia likely in the setting of CHF: -I will check a serum osmolality, urine sodium potassium and urine osmolality -On IV Lasix -Repeat lab in the morning.  #Chronic kidney disease is stage III: Serum creatinine level around baseline. Monitor BMP and electrolytes. Repeat lab in the morning.  #Hypothyroidism: Continue Synthroid. Check TSH level.  #Continue eyedrops.  DVT prophylaxis: Systemic anticoagulation Code Status: Full code Family Communication: Tried to call patient's daughter, unable to contact Disposition Plan: Admit to telemetry floor Consults called: None Admission status: Inpatient   Dron Jaynie Collins MD [facility] Pager [phone]  If 7PM-7AM, please contact night-coverage [email or web address] Password [email or web address]  05/08/2016, 5:25 PM

## 2016-05-08 NOTE — ED Provider Notes (Addendum)
[facility] DEPT Provider Note   CSN: [id number] Arrival date & time: 05/08/16  1220     History   Chief Complaint Chief Complaint  Patient presents with  . Shortness of Breath    HPI Shannon [name] is a 83 y.o. female.  Patient c/o feeling generally weak for the past 2 weeks. Also notes non prod cough, sob, poor appetite. Symptoms moderate, persistent, worse this week.  Pt limited historian - level 5 caveat.   Pt denies fever or chills, temp  In ED 99.6.  No chest pain. Mild sob. No increased leg edema. Denies abd pain. No nvd. No dysuria or gu c/o. Notes recent tx for pna and uti, currently off abx.    The history is provided by the patient and a relative. The history is limited by the condition of the patient.  Shortness of Breath  Associated symptoms include cough. Pertinent negatives include no headaches, no sore throat, no neck pain, no chest pain, no vomiting, no abdominal pain and no rash.    Past Medical History:  Diagnosis Date  . Anemia    previous blood transfusions  . Arthritis    "all over"  . Asthma   . Bradycardia    requiring previous d/c of BB and reduction of amiodarone  . CAD (coronary artery disease)    nonobstructive per notes  . Chronic diastolic CHF (congestive heart failure) ([place])   . CKD (chronic kidney disease), stage III   . Complication of blood transfusion    "got the wrong blood type at [facility] in ~ 2015; no adverse reaction that we are aware of"/daughter, [name] (01/27/2016)  . COPD (chronic obstructive pulmonary disease) ([place])   . Depression    "light case"  . DVT (deep venous thrombosis) ([place]) 01/2016   a. LLE DVT 01/2016 - switched from Eliquis to Coumadin.  . Gastric stenosis    a. s/p stomach tube  . GERD (gastroesophageal reflux disease)   . History of blood transfusion    "several" (01/27/2016)  . History of stomach ulcers   . Hyperlipidemia   . Hypertension   . Hypothyroidism   . Paraesophageal hernia   . Perforated  gastric ulcer ([place])   . Seasonal allergies   . SIADH (syndrome of inappropriate ADH production) ([place])    [name] 01/10/2015  . Small bowel obstruction    "I don't know how many" (01/11/2015)  . Stroke ([place])    "light one"  . Type II diabetes mellitus ([place])    "related to prednisone use  for > 20 yr; once predinose stopped; no more DM RX" (01/27/2016)  . Ventral hernia with bowel obstruction     Patient Active Problem List   Diagnosis Date Noted  . Acute on chronic congestive heart failure ([place])   . CHF (NYHA class IV, ACC/AHA stage D) ([place])   . Advance care planning   . Palliative care by specialist   . Shortness of breath   . Pressure injury of skin 03/11/2016  . PEG (percutaneous endoscopic gastrostomy) status ([place]) 03/10/2016  . Acute on chronic diastolic CHF (congestive heart failure) ([place]) 03/02/2016  . Encounter for therapeutic drug monitoring 02/16/2016  . Sepsis ([place]) 02/08/2016  . UTI (urinary tract infection) 02/08/2016  . Warfarin anticoagulation   . Chronic seasonal allergic rhinitis   . Sinus pain   . Acute renal insufficiency 01/26/2016  . Acute deep vein thrombosis (DVT) of left lower extremity ([place])   . Acute respiratory failure ([place])   .  Respiratory alkalosis   . Atrial flutter ([place]) 01/24/2016  . Chronic diastolic CHF (congestive heart failure) ([place]) 01/24/2016  . Infection 01/24/2016  . Bilateral leg edema 12/29/2015  . Hypothyroidism due to medication   . Weakness   . Pyloric stenosis   . Pressure ulcer 09/15/2015  . Hyperkalemia 09/12/2015  . Acute osteomyelitis of toe of right foot ([employer])   . PVD (peripheral vascular disease) ([place])   . Adjustment disorder with anxiety 08/29/2015  . Diabetic ulcer of toe of right foot associated with type 2 diabetes mellitus, with necrosis of muscle ([place])   . Diverticulitis of large intestine without perforation or abscess without bleeding   . Colitis 08/26/2015  . Acute on chronic diastolic CHF (congestive heart  failure), NYHA class 4 ([place]) 08/02/2015  . Esophageal candidiasis ([place])   . Goals of care, counseling/discussion   . Dyspnea   . SOB (shortness of breath)   . Acute renal failure superimposed on stage 3 chronic kidney disease ([place])   . Encounter for long-term (current) use of other high-risk medications   . Respiratory distress 07/06/2015  . Dysphagia 07/05/2015  . Asthma with allergic rhinitis 06/04/2015  . Hypertensive heart disease 03/18/2015  . Malnutrition of moderate degree 01/28/2015  . Syncope 01/27/2015  . Fever, unspecified 01/22/2015  . Bradycardia 01/21/2015  . Hypotension 01/21/2015  . Palliative care encounter   . DNR (do not resuscitate) discussion   . Protein-calorie malnutrition, severe 01/12/2015  . Elevated troponin 01/12/2015  . HTN (hypertension), uncontrolled 01/12/2015  . AP (abdominal pain) 01/10/2015  . SBO (small bowel obstruction) 01/10/2015  . Cerebrovascular disease 07/14/2014  . Diabetic polyneuropathy associated with type 2 diabetes mellitus ([place]) 07/14/2014  . Hyperlipidemia 07/14/2014  . Gastric outlet obstruction 05/21/2014  . Unstable gait 04/29/2014  . Hyponatremia 03/23/2014  . Acute encephalopathy 03/22/2014  . Slurred speech   . Gastroparesis   . Slow transit constipation 02/03/2014  . Carotid stenosis 02/03/2014  . Demand ischemia ([place]) 01/21/2014  . Gastric bezoar 01/20/2014  . Anemia of chronic disease 01/18/2014  . Chronic kidney insufficiency, stage 3 (moderate) 01/18/2014  . Depression 09/29/2013  . Essential hypertension, benign 09/22/2013  . GERD (gastroesophageal reflux disease) 09/22/2013  . Dyslipidemia 09/22/2013  . DM (diabetes mellitus) type II uncontrolled, periph vascular disorder ([place]) 09/22/2013  . Persistent atrial fibrillation ([place]) 09/22/2013  . CVA (cerebral vascular accident) ([place]) 09/22/2013  . Diabetic neuropathy ([place]) 09/22/2013  . Hypothyroidism 09/22/2013  . Chronic gastrojejunal ulcer with perforation  ([place]) 09/07/2013  . Diabetes ([place]) 09/07/2013  . Accelerated hypertension 09/07/2013  . Systemic lupus erythematosus (SLE) inhibitor 09/07/2013  . Other specified abnormal immunological findings in serum 09/07/2013    Past Surgical History:  Procedure Laterality Date  . CATARACT EXTRACTION W/ INTRAOCULAR LENS  IMPLANT, BILATERAL    . CHOLECYSTECTOMY OPEN    . COLECTOMY    . ESOPHAGOGASTRODUODENOSCOPY N/A 01/19/2014   Procedure: ESOPHAGOGASTRODUODENOSCOPY (EGD);  Surgeon: Irene Shipper, MD;  Location: [facility];  Service: Endoscopy;  Laterality: N/A;  . ESOPHAGOGASTRODUODENOSCOPY N/A 01/20/2014   Procedure: ESOPHAGOGASTRODUODENOSCOPY (EGD);  Surgeon: Irene Shipper, MD;  Location: [facility];  Service: Endoscopy;  Laterality: N/A;  . ESOPHAGOGASTRODUODENOSCOPY N/A 03/19/2014   Procedure: ESOPHAGOGASTRODUODENOSCOPY (EGD);  Surgeon: Milus Banister, MD;  Location: [facility];  Service: Endoscopy;  Laterality: N/A;  . ESOPHAGOGASTRODUODENOSCOPY N/A 07/08/2015   Procedure: ESOPHAGOGASTRODUODENOSCOPY (EGD);  Surgeon: Doran Stabler, MD;  Location: [facility] END[facility]COPY;  Service: Endoscopy;  Laterality: N/A;  . ESOPHAGOGASTRODUODENOSCOPY (  EGD) WITH PROPOFOL N/A 09/15/2015   Procedure: ESOPHAGOGASTRODUODENOSCOPY (EGD) WITH PROPOFOL;  Surgeon: Manus Gunning, MD;  Location: [facility] ENDOSCOPY;  Service: Gastroenterology;  Laterality: N/A;  . GASTROJEJUNOSTOMY     hx/notes 01/10/2015  . GASTROJEJUNOSTOMY N/A 09/23/2015   Procedure: OPEN GASTROJEJUNOSTOMY TUBE PLACEMENT;  Surgeon: Arta Bruce Kinsinger, MD;  Location: [facility] ORS;  Service: General;  Laterality: N/A;  . GLAUCOMA SURGERY Bilateral   . HERNIA REPAIR  2015  . IR GENERIC HISTORICAL  01/07/2016   IR GJ TUBE CHANGE 01/07/2016 Jacqulynn Cadet, MD [facility]  . IR GENERIC HISTORICAL  01/27/2016   IR MECH REMOV OBSTRUC MAT ANY COLON TUBE W/FLUORO 01/27/2016 Markus Daft, MD [facility]  . IR GENERIC HISTORICAL  02/07/2016   IR PATIENT EVAL TECH  0-60 MINS Darrell K Allred, PA-C [facility]  . IR GENERIC HISTORICAL  02/08/2016   IR GJ TUBE CHANGE 02/08/2016 Greggory Keen, MD [facility]  . IR GENERIC HISTORICAL  01/06/2016   IR GJ TUBE CHANGE 01/06/2016 [facility] OUT REF  . IR GENERIC HISTORICAL  05/02/2016   IR CM INJ ANY COLONIC TUBE W/FLUORO 05/02/2016 Arne Cleveland, MD [facility]  . LAPAROTOMY N/A 01/20/2015   Procedure: EXPLORATORY LAPAROTOMY;  Surgeon: Coralie Keens, MD;  Location: [facility];  Service: General;  Laterality: N/A;  . LYSIS OF ADHESION N/A 01/20/2015   Procedure: LYSIS OF ADHESIONS < 1 HOUR;  Surgeon: Coralie Keens, MD;  Location: [facility];  Service: General;  Laterality: N/A;  . TONSILLECTOMY    . TUBAL LIGATION    . VENTRAL HERNIA REPAIR  2015   incarcerated ventral hernia ([facility] 09/2013)/notes 01/10/2015    OB History    No data available       Home Medications    Prior to Admission medications   Medication Sig Start Date End Date Taking? Authorizing Provider  acetaminophen (TYLENOL) 325 MG tablet Take 650 mg by mouth 2 (two) times daily as needed for mild pain.    Yes Historical Provider, MD  ADVAIR DISKUS 250-50 MCG/DOSE AEPB INHALE 1 PUFF INTO THE LUNGS 2 (TWO) TIMES DAILY AS NEEDED FOR SHORTNESS OF BREATH 12/01/14  Yes Historical Provider, MD  AMINO ACIDS-PROTEIN HYDROLYS PO Take 30 mLs by mouth 2 (two) times daily.   Yes Historical Provider, MD  amiodarone (PACERONE) 200 MG tablet Take 200 mg by mouth daily.   Yes Historical Provider, MD  atorvastatin (LIPITOR) 10 MG tablet Take 10 mg by mouth daily.   Yes Historical Provider, MD  b complex vitamins tablet Take 1 tablet by mouth daily.    Yes Historical Provider, MD  benzonatate (TESSALON) 200 MG capsule Take 1 capsule (200 mg total) by mouth 3 (three) times daily as needed for cough. 03/24/16  Yes Gildardo Cranker, DO  calcium-vitamin D (OSCAL WITH D) 500-200 MG-UNIT per tablet Take 1 tablet by mouth daily with breakfast.    Yes Historical Provider, MD    cetirizine (ZYRTEC) 10 MG tablet Take 10 mg by mouth daily as needed for allergies or rhinitis.   Yes Historical Provider, MD  chlorhexidine (PERIDEX) 0.12 % solution Use as directed 15 mLs in the mouth or throat 2 (two) times daily.   Yes Historical Provider, MD  diltiazem (CARDIZEM CD) 120 MG 24 hr capsule Take 1 capsule (120 mg total) by mouth daily. 02/23/16 05/23/16 Yes Thompson Grayer, MD  feeding supplement, GLUCERNA SHAKE, (GLUCERNA SHAKE) LIQD Take 237 mLs by mouth See admin instructions. One to two times a day  Yes Historical Provider, MD  fluticasone (FLONASE) 50 MCG/ACT nasal spray Place 2 sprays into both nostrils daily. 08/07/14  Yes Gildardo Cranker, DO  furosemide (LASIX) 40 MG tablet Take 40 mg by mouth 2 (two) times daily.   Yes Historical Provider, MD  gabapentin (NEURONTIN) 300 MG capsule Take 1 capsule (300 mg total) by mouth 3 (three) times daily. 02/02/16  Yes Arbutus Leas, NP  hydrALAZINE (APRESOLINE) 50 MG tablet Take 1 tablet (50 mg total) by mouth 3 (three) times daily. 11/11/15  Yes Eugenie Filler, MD  HYDROcodone-acetaminophen (NORCO) 5-325 MG tablet Take 1 tablet by mouth every 6 (six) hours as needed for moderate pain. 02/18/16  Yes Monica Carter, DO  ipratropium-albuterol (DUONEB) 0.5-2.5 (3) MG/3ML SOLN Take 3 mLs by nebulization every 6 (six) hours as needed (sob). Use 3 times daily x 4 days then every 6 hours as needed. Patient taking differently: Take 3 mLs by nebulization See admin instructions. Every 4 to 6 hours (up to 6 times a day) 03/21/16  Yes Eileen Stanford, PA-C  isosorbide mononitrate (IMDUR) 30 MG 24 hr tablet TAKE 1 TABLET (30 MG TOTAL) BY MOUTH DAILY. 04/13/16  Yes Dorothy Spark, MD  latanoprost (XALATAN) 0.005 % ophthalmic solution Place 1 drop into both eyes at bedtime. 06/02/14  Yes Mahima Bubba Camp, MD  levalbuterol [facility] HFA) 45 MCG/ACT inhaler Inhale 2 puffs into the lungs every 6 (six) hours as needed for wheezing or shortness of breath. 02/03/16   Yes Gildardo Cranker, DO  levothyroxine (SYNTHROID, LEVOTHROID) 137 MCG tablet TAKE 1 TABLET (137 MCG TOTAL) BY MOUTH DAILY BEFORE BREAKFAST. 04/04/16  Yes Gildardo Cranker, DO  Maltodextrin-Xanthan Gum (RESOURCE THICKENUP CLEAR) POWD Take 120 g by mouth as needed (use to thicken liquids.). 11/11/15  Yes Eugenie Filler, MD  Multiple Vitamin (MULTIVITAMIN WITH MINERALS) TABS tablet Take 1 tablet by mouth daily.   Yes Historical Provider, MD  Nutritional Supplements (FEEDING SUPPLEMENT, OSMOLITE 1.5 CAL,) LIQD Place 1,000 mLs into feeding tube daily. 09/30/15  Yes Debbe Odea, MD  pantoprazole (PROTONIX) 40 MG tablet TAKE 1 TABLET BY MOUTH TWICE A DAY 03/09/16  Yes Gildardo Cranker, DO  Polyethyl Glycol-Propyl Glycol 0.4-0.3 % SOLN Place 1 drop into both eyes 4 (four) times daily.    Yes Historical Provider, MD  RESTASIS 0.05 % ophthalmic emulsion USE 1 DROP INTO BOTH EYES TWICE DAILY 11/01/15  Yes Estill Dooms, MD  sertraline (ZOLOFT) 100 MG tablet TAKE ONE TABLET BY MOUTH ONCE DAILY FOR DEPRESSION 04/10/16  Yes Gildardo Cranker, DO  traMADol (ULTRAM) 50 MG tablet Take 50 mg by mouth daily as needed for moderate pain.    Yes Historical Provider, MD  warfarin (COUMADIN) 2.5 MG tablet Take as directed by [facility]. Patient taking differently: Take 2.5-3.75 mg by mouth See admin instructions. 3.75 mg at 1800 on Sun/Tues/Wed/Thurs/Fri/Sat and 2.5 mg on Mon 02/16/16  Yes Dorothy Spark, MD  Water For Irrigation, Sterile (FREE WATER) SOLN Place 230 mLs into feeding tube 3 (three) times daily.   Yes Historical Provider, MD  bisacodyl (DULCOLAX) 10 MG suppository Place 1 suppository (10 mg total) rectally daily as needed for moderate constipation. Patient not taking: Reported on 05/08/2016 09/30/15   Debbe Odea, MD  feeding supplement (BOOST / RESOURCE BREEZE) LIQD Take 1 Container by mouth daily. Patient not taking: Reported on 05/08/2016 11/11/15   Eugenie Filler, MD  polyethylene glycol powder  (GLYCOLAX/MIRALAX) powder MIX 17GRAMS IN 8 OUNCES OF LIQUID AND DRINK  TWICE DAILY AS NEEDED Patient not taking: Reported on 05/08/2016 09/13/15   Gildardo Cranker, DO    Family History Family History  Problem Relation Age of Onset  . Stroke Mother   . Hypertension Mother   . Diabetes Brother   . Heart attack Neg Hx     Social History Social History  Substance Use Topics  . Smoking status: Never Smoker  . Smokeless tobacco: Former Systems developer    Types: Snuff     Comment: "used snuff in my younger days"  . Alcohol use No     Allergies   Penicillins   Review of Systems Review of Systems  Constitutional: Negative for chills.  HENT: Negative for sore throat.   Eyes: Negative for visual disturbance.  Respiratory: Positive for cough and shortness of breath.   Cardiovascular: Negative for chest pain.  Gastrointestinal: Negative for abdominal pain and vomiting.  Genitourinary: Negative for flank pain.  Musculoskeletal: Negative for back pain and neck pain.  Skin: Negative for rash.  Neurological: Negative for headaches.  Hematological: Does not bruise/bleed easily.  Psychiatric/Behavioral: Negative for agitation.     Physical Exam Updated Vital Signs BP 133/71   Pulse 88   Temp 97.9 F (36.6 C) (Oral)   Resp 20   Wt 64.2 kg   SpO2 95%   BMI 22.84 kg/m   Physical Exam  Constitutional: She appears well-developed and well-nourished. No distress.  HENT:  Mouth/Throat: Oropharynx is clear and moist.  Eyes: Conjunctivae are normal. Pupils are equal, round, and reactive to light. No scleral icterus.  Neck: Neck supple. No tracheal deviation present.  No stiffness or rigidity  Cardiovascular: Normal rate, regular rhythm, normal heart sounds and intact distal pulses.  Exam reveals no gallop and no friction rub.   No murmur heard. Pulmonary/Chest: Effort normal. No respiratory distress.  Rhonchi bil.   Abdominal: Soft. Normal appearance and bowel sounds are normal. She exhibits no  distension.  Genitourinary:  Genitourinary Comments: No cva tenderness  Musculoskeletal:  Mild bil leg edema.   Neurological: She is alert.  Skin: Skin is warm and dry. No rash noted. She is not diaphoretic.  Psychiatric: She has a normal mood and affect.  Nursing note and vitals reviewed.    ED Treatments / Results  Labs (all labs ordered are listed, but only abnormal results are displayed) Results for orders placed or performed during the hospital encounter of 73/71/06  Basic metabolic panel  Result Value Ref Range   Sodium 123 (L) 135 - 145 mmol/L   Potassium 4.3 3.5 - 5.1 mmol/L   Chloride 86 (L) 101 - 111 mmol/L   CO2 26 22 - 32 mmol/L   Glucose, Bld 127 (H) 65 - 99 mg/dL   BUN 56 (H) 6 - 20 mg/dL   Creatinine, Ser 1.63 (H) 0.44 - 1.00 mg/dL   Calcium 9.6 8.9 - 10.3 mg/dL   GFR calc non Af Amer 27 (L) >60 mL/min   GFR calc Af Amer 31 (L) >60 mL/min   Anion gap 11 5 - 15  CBC  Result Value Ref Range   WBC 6.4 4.0 - 10.5 K/uL   RBC 3.73 (L) 3.87 - 5.11 MIL/uL   Hemoglobin 10.2 (L) 12.0 - 15.0 g/dL   HCT 30.2 (L) 36.0 - 46.0 %   MCV 81.0 78.0 - 100.0 fL   MCH 27.3 26.0 - 34.0 pg   MCHC 33.8 30.0 - 36.0 g/dL   RDW 18.2 (H) 11.5 - 15.5 %  Platelets 301 150 - 400 K/uL  I-stat troponin, ED  Result Value Ref Range   Troponin i, poc 0.02 0.00 - 0.08 ng/mL   Comment 3           Dg Chest 2 View  Result Date: 05/08/2016 CLINICAL DATA:  Cough and shortness of breath over the last week. EXAM: CHEST  2 VIEW COMPARISON:  04/13/2016 FINDINGS: Heart size is normal. There is aortic atherosclerosis. There is newly seen basilar volume loss, left more than right. This is consistent with mild basilar pneumonia. Upper lungs are clear. No effusions. No acute bone finding. IMPRESSION: Newly seen volume loss at both lung bases most consistent with mild atelectatic pneumonia. Aortic atherosclerosis. Electronically Signed   By: Nelson Chimes M.D.   On: 05/08/2016 13:05   Dg Chest 2  View  Result Date: 04/13/2016 CLINICAL DATA:  Chronic cough and congestion with shortness of breath, history of diabetes EXAM: CHEST  2 VIEW COMPARISON:  Chest x-ray of 03/10/2016 and 03/06/2016 FINDINGS: The previously noted left lower lobe opacity has cleared. No pleural effusion is seen. The right lung is well aerated. Mediastinal and hilar contours are unremarkable and cardiomegaly is stable. No acute bony abnormality seen with probable old compression deformity of what appears to be T11 vertebral body. NG tube remains. IMPRESSION: 1. No definite pneumonia or effusion. Clearing of left basilar opacity. 2. Stable cardiomegaly. 3. Apparent old partial compression deformity of T11 . Electronically Signed   By: Ivar Drape M.D.   On: 04/13/2016 15:20   Ir Cm Inj Any Colonic Tube W/fluoro  Result Date: 05/02/2016 INDICATION: Feeding difficulties, catheter dislodgement. Previous GJ exchange 02/08/2016. EXAM: GI TUBE INJECTION FLUOROSCOPY TIME:  2 minutes 40 second, 25 mGy COMPLICATIONS: None immediate. PROCEDURE: Informed written consent was obtained from the patient after a thorough discussion of the procedural risks, benefits and alternatives. All questions were addressed. Sterile Technique was utilized including . A timeout was performed prior to the initiation of the procedure. Initial fluoroscopic inspection demonstrated good position of the retention balloon in the region the gastric lumen. The jejunostomy limb extends beyond the ligament of Treitz. The weighted tip appears folded back upon the catheter. Injection confirms patency of the jejunal limb. An angled stiff glidewire was advanced through the jejunal liMb to straighten the apparent kink in the distal catheter. Final injection shows good position of the catheter, no kink, tip beyond the ligament of Treitz, widely patent, okay for routine use. IMPRESSION: Dual-lumen gastrojejunostomy catheter in good position, okay for routine use. Electronically  Signed   By: Lucrezia Europe M.D.   On: 05/02/2016 15:26    EKG  EKG Interpretation  Date/Time:  Monday May 08 2016 12:25:06 EST Ventricular Rate:  89 PR Interval:    QRS Duration: 66 QT Interval:  314 QTC Calculation: 382 R Axis:   93 Text Interpretation:  Atrial flutter with variable A-V block Rightward axis Nonspecific ST and T wave abnormality Confirmed by Ashok Cordia  MD, Lennette Bihari ([id number]) on 05/08/2016 4:41:57 PM       Radiology Dg Chest 2 View  Result Date: 05/08/2016 CLINICAL DATA:  Cough and shortness of breath over the last week. EXAM: CHEST  2 VIEW COMPARISON:  04/13/2016 FINDINGS: Heart size is normal. There is aortic atherosclerosis. There is newly seen basilar volume loss, left more than right. This is consistent with mild basilar pneumonia. Upper lungs are clear. No effusions. No acute bone finding. IMPRESSION: Newly seen volume loss at both lung bases most  consistent with mild atelectatic pneumonia. Aortic atherosclerosis. Electronically Signed   By: Nelson Chimes M.D.   On: 05/08/2016 13:05    Procedures Procedures (including critical care time)  Medications Ordered in ED Medications  ceFEPIme (MAXIPIME) 2 g in dextrose 5 % 50 mL IVPB (not administered)  vancomycin (VANCOCIN) 1,500 mg in sodium chloride 0.9 % 500 mL IVPB (not administered)     Initial Impression / Assessment and Plan / ED Course  I have reviewed the triage vital signs and the nursing notes.  Pertinent labs & imaging results that were available during my care of the patient were reviewed by me and considered in my medical decision making (see chart for details).  Iv ns. Labs. Cxr.   cxr with ?infiltrate.   Blood cxs and lactate added.  Na low, ns bolus.  Iv abx, will cover for hcap.     Final Clinical Impressions(s) / ED Diagnoses   Final diagnoses:  None    New Prescriptions New Prescriptions   No medications on file         Lajean Saver, MD 05/08/16 1642

## 2016-05-08 NOTE — ED Triage Notes (Addendum)
Short winded x  1 week  No n/v  C/o neck pain  And c/o left eye pain states it gets matted up

## 2016-05-08 NOTE — Progress Notes (Addendum)
Pharmacy Antibiotic Note  Shannon [name] is a 83 y.o. female admitted on 05/08/2016 with pneumonia.  Pharmacy has been consulted for vancomycin/cefepime dosing. Pt has noted penicillin allergy, but appears to have tolerated cephalosporins in the past.  Plan: -Cefepime 2g IV x1 per MD -Cefepime 1g IV q24h -Vancomycin 1500mg  IV x1 -Vancomycin 750mg  IV q24h -F/U cultures, LOT, VT as indicated  Weight: 141 lb 8 oz (64.2 kg)  Temp (24hrs), Avg:98.6 F (37 C), Min:97.9 F (36.6 C), Max:99.6 F (37.6 C)   Recent Labs Lab 05/08/16 1241  WBC 6.4  CREATININE 1.63*    Estimated Creatinine Clearance: 21.5 mL/min (by C-G formula based on SCr of 1.63 mg/dL (H)).    Allergies  Allergen Reactions  . Penicillins Itching and Rash    Has patient had a PCN reaction causing immediate rash, facial/tongue/throat swelling, SOB or lightheadedness with hypotension: Yes Has patient had a PCN reaction causing severe rash involving mucus membranes or skin necrosis: Unk Has patient had a PCN reaction that required hospitalization: Unk Has patient had a PCN reaction occurring within the last 10 years: Unk If all of the above answers are "NO", then may proceed with Cephalosporin use.     Antimicrobials this admission: 2/5 Vancomycin >> 2/5 Cefepime >>  Dose adjustments this admission: none  Microbiology results: 2/5 BCx: IP  Thank you for allowing pharmacy to be a part of this patient's care.  Arrie Senate, PharmD PGY-1 Pharmacy Resident Pager: [phone] 05/08/2016

## 2016-05-08 NOTE — Telephone Encounter (Signed)
Pamalee Leyden with [facility] called requesting verbal orders for Missed visit. Verbal order given.

## 2016-05-08 NOTE — Patient Instructions (Signed)
Medication Instructions:  Your physician recommends that you continue on your current medications as directed. Please refer to the Current Medication list given to you today.   Labwork: None ordered  Testing/Procedures: None ordered  Follow-Up: Your physician recommends that you schedule a follow-up appointment in:    Any Other Special Instructions Will Be Listed Below (If Applicable).     If you need a refill on your cardiac medications before your next appointment, please call your pharmacy.

## 2016-05-08 NOTE — Progress Notes (Signed)
ANTICOAGULATION CONSULT NOTE - Initial Consult  Pharmacy Consult for warfarin Indication: atrial fibrillation  Allergies  Allergen Reactions  . Penicillins Itching and Rash    Has patient had a PCN reaction causing immediate rash, facial/tongue/throat swelling, SOB or lightheadedness with hypotension: Yes Has patient had a PCN reaction causing severe rash involving mucus membranes or skin necrosis: Unk Has patient had a PCN reaction that required hospitalization: Unk Has patient had a PCN reaction occurring within the last 10 years: Unk If all of the above answers are "NO", then may proceed with Cephalosporin use.     Patient Measurements: Weight: 141 lb 8 oz (64.2 kg) Heparin Dosing Weight: n/a  Vital Signs: Temp: 97.9 F (36.6 C) (02/05 1503) Temp Source: Oral (02/05 1503) BP: 133/71 (02/05 1615) Pulse Rate: 88 (02/05 1615)  Labs:  Recent Labs  05/08/16 1053 05/08/16 1241 05/08/16 1642  HGB  --  10.2*  --   HCT  --  30.2*  --   PLT  --  301  --   LABPROT  --   --  17.3*  INR  --   --  1.40  CREATININE 1.59* 1.63*  --     Estimated Creatinine Clearance: 21.5 mL/min (by C-G formula based on SCr of 1.63 mg/dL (H)).   Medical History: Past Medical History:  Diagnosis Date  . Anemia    previous blood transfusions  . Arthritis    "all over"  . Asthma   . Bradycardia    requiring previous d/c of BB and reduction of amiodarone  . CAD (coronary artery disease)    nonobstructive per notes  . Chronic diastolic CHF (congestive heart failure) ([place])   . CKD (chronic kidney disease), stage III   . Complication of blood transfusion    "got the wrong blood type at [facility] in ~ 2015; no adverse reaction that we are aware of"/daughter, [name] (01/27/2016)  . COPD (chronic obstructive pulmonary disease) ([place])   . Depression    "light case"  . DVT (deep venous thrombosis) ([place]) 01/2016   a. LLE DVT 01/2016 - switched from Eliquis to Coumadin.  . Gastric stenosis    a. s/p stomach tube  . GERD (gastroesophageal reflux disease)   . History of blood transfusion    "several" (01/27/2016)  . History of stomach ulcers   . Hyperlipidemia   . Hypertension   . Hypothyroidism   . Paraesophageal hernia   . Perforated gastric ulcer ([place])   . Seasonal allergies   . SIADH (syndrome of inappropriate ADH production) ([place])    [name] 01/10/2015  . Small bowel obstruction    "I don't know how many" (01/11/2015)  . Stroke ([place])    "light one"  . Type II diabetes mellitus ([place])    "related to prednisone use  for > 20 yr; once predinose stopped; no more DM RX" (01/27/2016)  . Ventral hernia with bowel obstruction     Assessment: 78 yoF on warfarin PTA for atrial fibrillation to resume per pharmacy on admit. INR in ED subtherapeutic at 1.40, no S/Sx bleeding noted, Hgb 10.2.  PTA warfarin dose = 3.75mg  daily except 2.5mg  on Mondays  Goal of Therapy:  INR 2-3 Monitor platelets by anticoagulation protocol: Yes   Plan:  -Warfarin 5mg  PO x1 tonight -Monitor INR, S/Sx bleeding daily  Arrie Senate, PharmD PGY-1 Pharmacy Resident Pager: [phone] 05/08/2016

## 2016-05-09 ENCOUNTER — Inpatient Hospital Stay (HOSPITAL_COMMUNITY): Payer: Medicare Other

## 2016-05-09 DIAGNOSIS — M79609 Pain in unspecified limb: Secondary | ICD-10-CM

## 2016-05-09 DIAGNOSIS — I509 Heart failure, unspecified: Secondary | ICD-10-CM

## 2016-05-09 LAB — BASIC METABOLIC PANEL
Anion gap: 13 (ref 5–15)
BUN: 51 mg/dL — ABNORMAL HIGH (ref 6–20)
CO2: 28 mmol/L (ref 22–32)
Calcium: 9.5 mg/dL (ref 8.9–10.3)
Chloride: 87 mmol/L — ABNORMAL LOW (ref 101–111)
Creatinine, Ser: 1.44 mg/dL — ABNORMAL HIGH (ref 0.44–1.00)
GFR calc Af Amer: 36 mL/min — ABNORMAL LOW (ref >60)
GFR calc non Af Amer: 31 mL/min — ABNORMAL LOW (ref >60)
Glucose, Bld: 170 mg/dL — ABNORMAL HIGH (ref 65–99)
Potassium: 3.8 mmol/L (ref 3.5–5.1)
Sodium: 128 mmol/L — ABNORMAL LOW (ref 135–145)

## 2016-05-09 LAB — CBC
HCT: 30.3 % — ABNORMAL LOW (ref 36.0–46.0)
Hemoglobin: 10.4 g/dL — ABNORMAL LOW (ref 12.0–15.0)
MCH: 28 pg (ref 26.0–34.0)
MCHC: 34.3 g/dL (ref 30.0–36.0)
MCV: 81.7 fL (ref 78.0–100.0)
Platelets: 287 10*3/uL (ref 150–400)
RBC: 3.71 MIL/uL — ABNORMAL LOW (ref 3.87–5.11)
RDW: 18.7 % — ABNORMAL HIGH (ref 11.5–15.5)
WBC: 6.2 10*3/uL (ref 4.0–10.5)

## 2016-05-09 LAB — EXPECTORATED SPUTUM ASSESSMENT W GRAM STAIN, RFLX TO RESP C

## 2016-05-09 LAB — GLUCOSE, CAPILLARY
Glucose-Capillary: 228 mg/dL — ABNORMAL HIGH (ref 65–99)
Glucose-Capillary: 124 mg/dL — ABNORMAL HIGH (ref 65–99)
Glucose-Capillary: 192 mg/dL — ABNORMAL HIGH (ref 65–99)
Glucose-Capillary: 128 mg/dL — ABNORMAL HIGH (ref 65–99)

## 2016-05-09 LAB — PROTIME-INR
INR: 1.31
Prothrombin Time: 16.4 seconds — ABNORMAL HIGH (ref 11.4–15.2)

## 2016-05-09 LAB — ECHOCARDIOGRAM COMPLETE
Height: 66 in
Weight: 2224 oz

## 2016-05-09 MED ORDER — OSMOLITE 1.5 CAL PO LIQD
1000.0000 mL | ORAL | Status: DC
  Administered 2016-05-12 – 2016-05-16 (×3): 1000 mL
  Filled 2016-05-09 (×15): qty 1000

## 2016-05-09 MED ORDER — INSULIN ASPART 100 UNIT/ML ~~LOC~~ SOLN
0.0000 [IU] | Freq: Three times a day (TID) | SUBCUTANEOUS | Status: DC
  Administered 2016-05-09: 2 [IU] via SUBCUTANEOUS
  Administered 2016-05-10: 1 [IU] via SUBCUTANEOUS
  Administered 2016-05-10: 3 [IU] via SUBCUTANEOUS
  Administered 2016-05-10: 2 [IU] via SUBCUTANEOUS
  Administered 2016-05-11: 3 [IU] via SUBCUTANEOUS
  Administered 2016-05-11 – 2016-05-12 (×3): 1 [IU] via SUBCUTANEOUS
  Administered 2016-05-12: 3 [IU] via SUBCUTANEOUS
  Administered 2016-05-12 – 2016-05-13 (×2): 2 [IU] via SUBCUTANEOUS
  Administered 2016-05-13: 1 [IU] via SUBCUTANEOUS
  Administered 2016-05-13: 2 [IU] via SUBCUTANEOUS
  Administered 2016-05-14: 1 [IU] via SUBCUTANEOUS
  Administered 2016-05-14 (×2): 2 [IU] via SUBCUTANEOUS
  Administered 2016-05-15: 3 [IU] via SUBCUTANEOUS
  Administered 2016-05-15: 1 [IU] via SUBCUTANEOUS
  Administered 2016-05-16: 3 [IU] via SUBCUTANEOUS
  Administered 2016-05-16 (×2): 2 [IU] via SUBCUTANEOUS
  Administered 2016-05-17: 3 [IU] via SUBCUTANEOUS
  Administered 2016-05-17: 2 [IU] via SUBCUTANEOUS

## 2016-05-09 MED ORDER — HYDROCODONE-ACETAMINOPHEN 5-325 MG PO TABS
1.0000 | ORAL_TABLET | Freq: Once | ORAL | Status: AC
  Administered 2016-05-09: 1 via ORAL
  Filled 2016-05-09: qty 1

## 2016-05-09 MED ORDER — SODIUM BICARBONATE 650 MG PO TABS
650.0000 mg | ORAL_TABLET | Freq: Once | ORAL | Status: AC
  Administered 2016-05-09: 650 mg via ORAL
  Filled 2016-05-09: qty 1

## 2016-05-09 MED ORDER — HYDROCODONE-ACETAMINOPHEN 5-325 MG PO TABS
1.0000 | ORAL_TABLET | Freq: Four times a day (QID) | ORAL | Status: DC | PRN
  Administered 2016-05-14 – 2016-05-17 (×2): 1 via ORAL
  Filled 2016-05-09 (×2): qty 1

## 2016-05-09 MED ORDER — RESOURCE THICKENUP CLEAR PO POWD
ORAL | Status: DC | PRN
  Filled 2016-05-09 (×2): qty 125

## 2016-05-09 MED ORDER — PRO-STAT SUGAR FREE PO LIQD
30.0000 mL | Freq: Every day | ORAL | Status: DC
  Administered 2016-05-10 – 2016-05-17 (×6): 30 mL
  Filled 2016-05-09 (×6): qty 30

## 2016-05-09 MED ORDER — IPRATROPIUM-ALBUTEROL 0.5-2.5 (3) MG/3ML IN SOLN
3.0000 mL | Freq: Three times a day (TID) | RESPIRATORY_TRACT | Status: DC
  Administered 2016-05-09 – 2016-05-17 (×21): 3 mL via RESPIRATORY_TRACT
  Filled 2016-05-09 (×23): qty 3

## 2016-05-09 NOTE — Progress Notes (Signed)
OT Cancellation Note  Patient Details Name: Shannon [name] MRN: [id number] DOB: June 06, 1923   Cancelled Treatment:    Reason Eval/Treat Not Completed: Medical issues which prohibited therapy (Increased WOB per RN. Will follow.)  [name] 05/09/2016, 10:29 AM

## 2016-05-09 NOTE — Progress Notes (Signed)
  Echocardiogram 2D Echocardiogram has been performed.  Tresa Res 05/09/2016, 1:11 PM

## 2016-05-09 NOTE — Progress Notes (Signed)
Nursing called to the patient room by physical therapy. While ambulating from bed to bedside commode patient expressed pain in her jaw that radiated to her chest. She described the pain as pressure. Patient was also short of breath but oxygen saturation on room air. She also felt warm to touch but patient is afebrile. Page notification sent to Dr. Carolin Sicks.

## 2016-05-09 NOTE — Evaluation (Signed)
Physical Therapy Evaluation Patient Details Name: Shannon [name] MRN: [id number] DOB: Aug 30, 1923 Today's Date: 05/09/2016   History of Present Illness  83 y.o. female with medical history significant of persistent atrial fibrillation on amiodarone, bradycardia, CKD stage III, hypothyroidism, non-obstructive coronary artery disease, type 2 diabetes, hypertension, chronic diastolic CHF, stroke, cerebrovascular disease, COPD, asthma, gastric outlet obstruction s/p prior J tube, anemia and hyponatremia, presented with shortness of breath and cough for worsening for about 2 weeks.  Clinical Impression  Patient demonstrates deficits in functional mobility as indicated below. Will need continued skilled PT to address deficits and maximize function. Will see as indicated and progress as tolerated. Recommend HHPT upon acute discharge if family able to continue to provide adequate level of assist.  OF NOTE:Upon reaching chair patient complained of mouth and jaw pain as well as chest pressure. Nursing inquired about arm pain and patient reported pain in left arm. At that time, rapid response nurse called re: active chest pressure with jaw and arm pain. VS assessed and stable throughout encounter. Patient assisted back to bed. Reports improvements in pain and pressure. Nursing present and assumed care at that time.    Follow Up Recommendations Home health PT;Supervision/Assistance - 24 hour    Equipment Recommendations  None recommended by PT    Recommendations for Other Services Speech consult     Precautions / Restrictions Precautions Precautions: Fall Restrictions Weight Bearing Restrictions: No      Mobility  Bed Mobility Overal bed mobility: Needs Assistance Bed Mobility: Supine to Sit     Supine to sit: Mod assist     General bed mobility comments: moderate assist to come to EOB and elevate trunk to upright. VCs for positioning and UE pull to sit. Assist to rotate trunk to  EOB  Transfers Overall transfer level: Needs assistance Equipment used: 2 person hand held assist (wrap around support with gait belt) Transfers: Sit to/from Stand Sit to Stand: Min assist;+2 physical assistance         General transfer comment: +2 min assist to power up to standing, manual assist to place LUE on Rw. multi modal cues for upright posture and positioning. Min assist for stability  Ambulation/Gait Ambulation/Gait assistance: Min assist;+2 physical assistance Ambulation Distance (Feet): 4 Feet Assistive device: Rolling walker (2 wheeled) Gait Pattern/deviations: Step-to pattern;Decreased stride length;Shuffle;Trunk flexed Gait velocity: decreased Gait velocity interpretation: <1.8 ft/sec, indicative of risk for recurrent falls General Gait Details: to shuffling steps forward approximately 4 ft to bedside commode. Upon reaching bedside commode saturations 96% on room air.   Stairs            Wheelchair Mobility    Modified Rankin (Stroke Patients Only)       Balance Overall balance assessment: Needs assistance Sitting-balance support: Bilateral upper extremity supported Sitting balance-Leahy Scale: Fair Sitting balance - Comments: able to sit EOB with out physical assist   Standing balance support: Bilateral upper extremity supported Standing balance-Leahy Scale: Poor Standing balance comment: heavy reliance on UE support                             Pertinent Vitals/Pain Pain Assessment: 0-10 Pain Score: 8  Pain Location: left heel and toes, radiating pain (also reported episode of jaw, pain, left arm pain and chest pressure nsg aware) Pain Descriptors / Indicators: Discomfort;Grimacing;Aching;Sharp;Sore Pain Intervention(s): Repositioned;Limited activity within patient's tolerance;Monitored during session    Home Living Family/patient expects to be discharged to:: Private  residence Living Arrangements: Children Available Help at  Discharge: Family;Available 24 hours/day Type of Home: House Home Access: Ramped entrance     Home Layout: One level Home Equipment: Walker - 4 wheels;Cane - single point;Shower seat;Hand held Careers information officer - 2 wheels Additional Comments: Lives w/ daughter, son in Social worker, and grandson    Prior Function Level of Independence: Needs assistance   Gait / Transfers Assistance Needed: Uses rollator   ADL's / Homemaking Assistance Needed: family assists with bathing and other ADLs PRN  Comments: Uses rollator during the day and RW at night.       Hand Dominance   Dominant Hand: Right    Extremity/Trunk Assessment   Upper Extremity Assessment Upper Extremity Assessment: Generalized weakness (noted tremor during session)    Lower Extremity Assessment Lower Extremity Assessment: Generalized weakness       Communication   Communication: No difficulties  Cognition Arousal/Alertness: Awake/alert Behavior During Therapy: Flat affect Overall Cognitive Status: No family/caregiver present to determine baseline cognitive functioning                      General Comments      Exercises     Assessment/Plan    PT Assessment Patient needs continued PT services  PT Problem List Decreased strength;Decreased activity tolerance;Decreased balance;Decreased mobility;Cardiopulmonary status limiting activity;Pain          PT Treatment Interventions DME instruction;Gait training;Functional mobility training;Therapeutic activities;Therapeutic exercise;Balance training;Patient/family education    PT Goals (Current goals can be found in the Care Plan section)  Acute Rehab PT Goals Patient Stated Goal: to get better PT Goal Formulation: With patient Time For Goal Achievement: 05/23/16 Potential to Achieve Goals: Good    Frequency Min 3X/week   Barriers to discharge        Co-evaluation               End of Session Equipment Utilized During Treatment: Gait  belt Activity Tolerance: Treatment limited secondary to medical complications (Comment) (active chest pain reported) Patient left: in bed;with call bell/phone within reach;with bed alarm set;with nursing/sitter in room Nurse Communication: Mobility status         Time: 0454-0981 PT Time Calculation (min) (ACUTE ONLY): 39 min   Charges:   PT Evaluation $PT Eval Moderate Complexity: 1 Procedure     PT G Codes:        Fabio Asa 2016-05-28, 2:49 PM Charlotte Crumb, PT DPT  [phone]

## 2016-05-09 NOTE — Progress Notes (Signed)
Pt is alert and oriented having pain, cough and crackles, medicated and IV consult for Lasix to clear lung, Resp giving Duo Neb.

## 2016-05-09 NOTE — Evaluation (Signed)
Occupational Therapy Evaluation Patient Details Name: Shannon [name] MRN: [id number] DOB: 1923/08/11 Today's Date: 05/09/2016    History of Present Illness 83 y.o. female with medical history significant of persistent atrial fibrillation on amiodarone, bradycardia, CKD stage III, hypothyroidism, non-obstructive coronary artery disease, type 2 diabetes, hypertension, chronic diastolic CHF, stroke, cerebrovascular disease, COPD, asthma, gastric outlet obstruction s/p prior J tube, anemia and hyponatremia, presented with shortness of breath and cough for worsening for about 2 weeks.   Clinical Impression   Pt is assisted for ADL and ambulates with a rollator and assist at home with her family. She presents with generailzede weakness, decreased activity tolerance and impaired balance. She required 2 person assist for sit to stand and ambulation a few feet this visit. Session limited by pt with increased coughing followed by report of chest pressure, jaw/mouth pain. Pt with coughing up thick mucous. RN made aware and in room assessing pt at end of session. Will follow.    Follow Up Recommendations  No OT follow up;Supervision/Assistance - 24 hour    Equipment Recommendations  None recommended by OT    Recommendations for Other Services       Precautions / Restrictions Precautions Precautions: Fall Restrictions Weight Bearing Restrictions: No      Mobility Bed Mobility Overal bed mobility: Needs Assistance Bed Mobility: Supine to Sit;Sit to Supine     Supine to sit: Mod assist Sit to supine: Min assist   General bed mobility comments: moderate assist to come to EOB and elevate trunk to upright. VCs for positioning and UE pull to sit. Assist to rotate trunk to EOB, min assist of LEs back into bed.  Transfers Overall transfer level: Needs assistance Equipment used: Rolling walker (2 wheeled) Transfers: Sit to/from Stand Sit to Stand: Min assist;+2 physical assistance          General transfer comment: +2 min assist to power up to standing, manual assist to place LUE on Rw. multi modal cues for upright posture and positioning. Min assist for stability    Balance Overall balance assessment: Needs assistance Sitting-balance support: Bilateral upper extremity supported Sitting balance-Leahy Scale: Fair Sitting balance - Comments: able to sit EOB with out physical assist   Standing balance support: Bilateral upper extremity supported Standing balance-Leahy Scale: Poor Standing balance comment: heavy reliance on UE support                            ADL Overall ADL's : Needs assistance/impaired Eating/Feeding: Set up;Sitting Eating/Feeding Details (indicate cue type and reason): drink--thickened by RN Grooming: Wash/dry face;Sitting;Set up   Upper Body Bathing: Minimal assistance;Sitting   Lower Body Bathing: Total assistance;Sit to/from stand   Upper Body Dressing : Moderate assistance;Sitting   Lower Body Dressing: Total assistance;Sit to/from stand   Toilet Transfer: +2 for physical assistance;Minimal assistance;Ambulation;RW   Toileting- Clothing Manipulation and Hygiene: Total assistance;Sit to/from stand         General ADL Comments: Pt ambulated from bed to 3 in 1, stood after toileting and sat in chair pulled up behind her. Returned to bed at RNs request when she complained of pain in her chest/jaw.     Vision     Perception     Praxis      Pertinent Vitals/Pain Pain Assessment: 0-10 Pain Score: 8  Pain Location: left heel and toes, radiating pain (also reported episode of jaw, pain, left arm pain and chest pressure nsg aware) Pain Descriptors /  Indicators: Discomfort;Grimacing;Aching;Sharp;Sore Pain Intervention(s): Monitored during session;Repositioned (RN notified of chest pain)     Hand Dominance Right   Extremity/Trunk Assessment Upper Extremity Assessment Upper Extremity Assessment: Generalized weakness    Lower Extremity Assessment Lower Extremity Assessment: Defer to PT evaluation       Communication Communication Communication: No difficulties   Cognition Arousal/Alertness: Awake/alert Behavior During Therapy: Flat affect Overall Cognitive Status: No family/caregiver present to determine baseline cognitive functioning                     General Comments       Exercises       Shoulder Instructions      Home Living Family/patient expects to be discharged to:: Private residence Living Arrangements: Children Available Help at Discharge: Family;Available 24 hours/day Type of Home: House Home Access: Ramped entrance     Home Layout: One level     Bathroom Shower/Tub: Chief Strategy Officer: Standard Bathroom Accessibility: Yes   Home Equipment: Walker - 4 wheels;Cane - single point;Shower seat;Hand held Careers information officer - 2 wheels   Additional Comments: Lives w/ daughter, son in Social worker, and grandson      Prior Functioning/Environment Level of Independence: Needs assistance  Gait / Transfers Assistance Needed: Uses rollator  ADL's / Homemaking Assistance Needed: family assists with bathing and other ADLs PRN   Comments: Uses rollator during the day and RW at night.          OT Problem List: Decreased strength;Decreased activity tolerance;Impaired balance (sitting and/or standing);Decreased cognition;Decreased safety awareness;Decreased knowledge of use of DME or AE;Cardiopulmonary status limiting activity;Pain   OT Treatment/Interventions: Self-care/ADL training;DME and/or AE instruction;Patient/family education;Balance training;Therapeutic activities    OT Goals(Current goals can be found in the care plan section) Acute Rehab OT Goals Patient Stated Goal: to get better OT Goal Formulation: With patient Time For Goal Achievement: 05/23/16 Potential to Achieve Goals: Fair ADL Goals Pt Will Perform Grooming: with supervision;with  set-up;sitting Pt Will Transfer to Toilet: with min assist;ambulating;bedside commode (over toilet) Pt Will Perform Toileting - Clothing Manipulation and hygiene: with mod assist;sit to/from stand Additional ADL Goal #1: Pt will perform bed mobility with min assist in preparation for ADL.  OT Frequency: Min 2X/week   Barriers to D/C:            Co-evaluation PT/OT/SLP Co-Evaluation/Treatment: Yes Reason for Co-Treatment: For patient/therapist safety   OT goals addressed during session: ADL's and self-care      End of Session Equipment Utilized During Treatment: Gait belt;Rolling walker Nurse Communication:  (pain in chest, jaw)  Activity Tolerance: Patient limited by pain;Patient limited by fatigue Patient left: in bed;with call bell/phone within reach;with bed alarm set;with nursing/sitter in room   Time: 1342-1421 OT Time Calculation (min): 39 min Charges:  OT General Charges $OT Visit: 1 Procedure OT Treatments $Self Care/Home Management : 23-37 mins G-Codes:    Evern Bio 05/09/2016, 3:42 PM  [phone]

## 2016-05-09 NOTE — Progress Notes (Signed)
Pt. Is alert and oriented has been in hospital with reoccurring PNA, and fluid overload, Notified MD about concerns of aspiration, MD did not want speech however approval of Thick liquids as precautions. Also ordered narco tab Q6 for Chest wall Pain.

## 2016-05-09 NOTE — Progress Notes (Signed)
Patient seen for therapy evaluation in conjunction with OT therapist. Prior to activity patient was 98% on room air at rest with HR 81. Assisted patient OOB to standing, took ~4 steps to [facility] with assist, while on BSC, patient temperature taken via dynamap as patient was very warm to touch and diaphoretic, temp 36.9 with pulse ox of 96% on room air with HR 94. While on [facility], nurse called to room as patient was coughing up very thick milky secretions. We assisted patient with hygiene and pericare then transferred from [facility] to chair. Upon reaching chair patient complained of mouth and jaw pain as well as chest pressure. Nursing inquired about arm pain and patient reported pain in left arm. At that time, rapid response nurse called re: active chest pressure with jaw and arm pain. All VS assessed and stable throughout encounter. Patient assisted back to bed. Reports improvements in pain and pressure. Nursing present and assumed care at that time. Full evaluation note to follow.  Alben Deeds, [place] DPT [phone]

## 2016-05-09 NOTE — Progress Notes (Signed)
Initial Nutrition Assessment  INTERVENTION:  Provide Osmolite 1.5@ 46ml/hr via J port of GJ tube atrate of 71ml/hr for 12 hours daily from 2000 hr to 0800 hr.   Provide 30 ml Prostat once daily via J-tube.   Tube feeding regimen provides 1270kcal (92% of needs), 64grams of protein, and 531ml of H2O.   Recommend SLP consult to assess safest fluid consistency.    NUTRITION DIAGNOSIS:   Inadequate oral intake related to altered GI function as evidenced by per patient/family report.   GOAL:   Patient will meet greater than or equal to 90% of their needs   MONITOR:   TF tolerance, PO intake, I & O's, Labs, Skin  REASON FOR ASSESSMENT:   Consult Enteral/tube feeding initiation and management  ASSESSMENT:   83 y.o. female with medical history significant of persistent atrial fibrillation on amiodarone, bradycardia, CKD stage III, hypothyroidism, non-obstructive coronary artery disease, type 2 diabetes, hypertension, chronic diastolic CHF, stroke, cerebrovascular disease, COPD, asthma, gastric outlet obstruction s/p prior J tube, anemia and hyponatremia, presented with shortness of breath and cough for worsening for about 2 weeks.  RD familiar with pt from previous admission. Pt received Osmolite 1.5 @ 50 ml/hr overnight. Pt reports she usually receives TF at 65 ml/hr at night. Pt denies any nausea or abdominal pain. She reports drinking liquids and eating ice cream, pudding and jello PTA. RN reports that pt requires thickened liquids, but pt's daughter was unclear about how thick liquids need to be. There may be concern that pt is aspirating. RN reports that J-tube became clogged this morning.   Moderate muscle wasting noted in nutrition-focused physical exam. No evidence of recent weight loss per weight history.   Labs: low sodium, low chloride, low hemoglobin, elevated BUN  Diet Order:  Diet clear liquid Room service appropriate? Yes; Fluid consistency: Thin  Skin:   Reviewed, no issues  Last BM:  2/5  Height:   Ht Readings from Last 1 Encounters:  05/08/16 5\' 6"  (1.676 m)    Weight:   Wt Readings from Last 1 Encounters:  05/09/16 139 lb (63 kg)    Ideal Body Weight:  59.09 kg  BMI:  Body mass index is 22.44 kg/m.  Estimated Nutritional Needs:   Kcal:  2297-9892  Protein:  75-85 grams  Fluid:  1.4-1.6 L/day  EDUCATION NEEDS:   No education needs identified at this time  [place], CSP, LDN Inpatient Clinical Dietitian Pager: [phone] After Hours Pager: [phone]

## 2016-05-09 NOTE — Progress Notes (Addendum)
*  PRELIMINARY RESULTS* Vascular Ultrasound Left lower extremity venous duplex has been completed.  Preliminary findings: No evidence of DVT or baker's cyst. Multiple non-thrombosed varicose veins noted in the left calf.     Landry Mellow, RDMS, RVT  05/09/2016, 3:19 PM

## 2016-05-09 NOTE — Progress Notes (Signed)
[place] for warfarin Indication: atrial fibrillation  Allergies  Allergen Reactions  . Penicillins Itching and Rash    Has patient had a PCN reaction causing immediate rash, facial/tongue/throat swelling, SOB or lightheadedness with hypotension: Yes Has patient had a PCN reaction causing severe rash involving mucus membranes or skin necrosis: Unk Has patient had a PCN reaction that required hospitalization: Unk Has patient had a PCN reaction occurring within the last 10 years: Unk If all of the above answers are "NO", then may proceed with Cephalosporin use.     Patient Measurements: Height: 5\' 6"  (167.6 cm) Weight: 139 lb (63 kg) IBW/kg (Calculated) : 59.3 Heparin Dosing Weight: n/a  Vital Signs: Temp: 98.8 F (37.1 C) (02/06 0959) Temp Source: Oral (02/06 0959) BP: 133/74 (02/06 0959) Pulse Rate: 100 (02/06 0959)  Labs:  Recent Labs  05/08/16 1053 05/08/16 1241 05/08/16 1642  HGB  --  10.2*  --   HCT  --  30.2*  --   PLT  --  301  --   LABPROT  --   --  17.3*  INR  --   --  1.40  CREATININE 1.59* 1.63*  --     Estimated Creatinine Clearance: 21.5 mL/min (by C-G formula based on SCr of 1.63 mg/dL (H)).   Assessment: 34 yoF on warfarin PTA for atrial fibrillation to resume per pharmacy on admit.   PTA warfarin dose = 3.75mg  daily except 2.5mg  on Mondays  Goal of Therapy:  INR 2-3 Monitor platelets by anticoagulation protocol: Yes   Plan:  -Repeat Warfarin 5mg  PO x1 tonight -Monitor INR, S/Sx bleeding daily  Thank you Anette Guarneri, PharmD [phone] 05/09/2016

## 2016-05-09 NOTE — Progress Notes (Signed)
Pt is alert and oriented, G-Tube clogged and MD Notified. IR Ordered for intervention.

## 2016-05-09 NOTE — Progress Notes (Signed)
PROGRESS NOTE    Shannon [name]  ZDG:[id number] DOB: June 25, 1923 DOA: 05/08/2016 PCP: Kirt Boys, DO   Brief Narrative: 83 y.o. female with medical history significant of persistent atrial fibrillation on amiodarone, bradycardia, CKD stage III,hypothyroidism,non-obstructive coronary artery disease, type 2 diabetes, hypertension, chronic diastolic CHF, stroke, cerebrovascular disease, COPD, asthma, gastric outlet obstruction s/p prior J tube, anemia and hyponatremia, presented with shortness of breath and cough for worsening for about 2 weeks.  Assessment & Plan:   Active Problems:   CAP (community acquired pneumonia)  # Possible community acquired pneumonia: -Patient lives at home with her daughter. The last hospital admission was about 2 months ago. Chest x-ray consistent with bibasal atelectasis/pneumonia.  -continue ceftriaxone and azithromycin.Pt is clinically improved. -Respiratory viral panel negative, discontinue droplet precaution -Patient is not febrile, not hypoxic and has no leukocytosis. -Continue supportive care. -Follow up culture results sent from the Er. -Pt is clinically improving.  #Likely acute on chronic diastolic congestive heart failure: Patient with elevated BNP at the cardiology clinic 1132. Patient with productive cough and dyspnea on exertion and trace lower extremity edema. Patient also with hyponatremia. -continue IV lasix. Serum sodium and creatinine level improving. Pt's SOB is better today. This favors CHF exacerbation. -I will continueLasix 40 IV twice a day, strict ins and outs, low-salt diet -pending echocardiogram -Patient has no chest pain, troponin not elevated. -Continue Imdur, hydralazine  #Paroxysmal atrial fibrillation: Continue amiodarone, diltiazem.  -INR subtherapeutic at 1.3.  -Continue Coumadin, pharmacy consulted -Monitor heart rate.  #Acute on chronic hyponatremia likely in the setting of CHF: -serum sodium level improving to  128 today -Repeat lab in the morning.  #Chronic kidney disease is stage III: Serum creatinine level around baseline. Monitor BMP and electrolytes. Repeat lab in the morning.  #Hypothyroidism: Continue Synthroid. Check TSH level.  #Continue eyedrops.  # left leg pain, chronic in nature: check doppler US to r/o DVT. I discussed this with pt's daughter at length. She and patient doesn't want any invasive testing or procedure. Pt is already on systemic ac (coumadin). Continue supportive care.  # Goals of care discussion: discussed with pt's daughter. She stated the patient does not want CPR, defibrillator or intubation. Okay with continuing medical care. DO NOT RESUSCITATE order was placed as per daughter's request.  # h/o gastric outlet obstruction: Pt's daughter reported that she can have   DVT prophylaxis: Systemic anticoagulation Code Status: DO NOT RESUSCITATE Family Communication: Discussed with the patient's daughter at length Disposition Plan: Likely discharge to rehabilitation versus home with home care in 1-2 days    Consultants:   None  Procedures: None Antimicrobials: Azithromycin, ceftriaxone  Subjective: Patient was seen and examined at bedside. Patient reported shortness of breath and cough is better. Denied chest pain, nausea or vomiting. She reported pain in her left foot on and off for months.  Objective: Vitals:   05/09/16 0353 05/09/16 0453 05/09/16 0902 05/09/16 0959  BP:  (!) 124/59  133/74  Pulse:  (!) 101 83 100  Resp:  20 (!) 22 (!) 21  Temp:  98.1 F (36.7 C)  98.8 F (37.1 C)  TempSrc:  Oral  Oral  SpO2: 95% 96% 93% 97%  Weight:  63 kg (139 lb)    Height:        Intake/Output Summary (Last 24 hours) at 05/09/16 1224 Last data filed at 05/09/16 1001  Gross per 24 hour  Intake             1225 ml  Output             2750 ml  Net            -1525 ml   Filed Weights   05/08/16 1504 05/08/16 1842 05/09/16 0453  Weight: 64.2 kg (141 lb 8 oz)  63 kg (139 lb) 63 kg (139 lb)    Examination:  General exam: Elderly female lying on bed, not in distress Respiratory system: Bibasal crackles. Respiratory effort normal. Cardiovascular system: S1 & S2 heard, RRR.  trace pedal edema. Gastrointestinal system: Abdomen is nondistended, soft and nontender. Normal bowel sounds heard. Central nervous system: Alert awake and following commands Extremities: Symmetric 5 x 5 power. Skin: No rashes, lesions or ulcers Psychiatry: Judgement and insight appear normal. Mood & affect appropriate.     Data Reviewed: I have personally reviewed following labs and imaging studies  CBC:  Recent Labs Lab 05/08/16 1241 05/09/16 0953  WBC 6.4 6.2  HGB 10.2* 10.4*  HCT 30.2* 30.3*  MCV 81.0 81.7  PLT 301 287   Basic Metabolic Panel:  Recent Labs Lab 05/08/16 1053 05/08/16 1241 05/09/16 0953  NA 121* 123* 128*  K 4.6 4.3 3.8  CL 81* 86* 87*  CO2 26 26 28   GLUCOSE 121* 127* 170*  BUN 53* 56* 51*  CREATININE 1.59* 1.63* 1.44*  CALCIUM 9.6 9.6 9.5   GFR: Estimated Creatinine Clearance: 24.3 mL/min (by C-G formula based on SCr of 1.44 mg/dL (H)). Liver Function Tests: No results for input(s): AST, ALT, ALKPHOS, BILITOT, PROT, ALBUMIN in the last 168 hours. No results for input(s): LIPASE, AMYLASE in the last 168 hours. No results for input(s): AMMONIA in the last 168 hours. Coagulation Profile:  Recent Labs Lab 05/08/16 1642 05/09/16 0953  INR 1.40 1.31   Cardiac Enzymes: No results for input(s): CKTOTAL, CKMB, CKMBINDEX, TROPONINI in the last 168 hours. BNP (last 3 results)  Recent Labs  05/08/16 1053  PROBNP 1,132*   HbA1C: No results for input(s): HGBA1C in the last 72 hours. CBG:  Recent Labs Lab 05/08/16 2137 05/09/16 0736 05/09/16 1209  GLUCAP 163* 228* 124*   Lipid Profile: No results for input(s): CHOL, HDL, LDLCALC, TRIG, CHOLHDL, LDLDIRECT in the last 72 hours. Thyroid Function Tests:  Recent Labs   05/08/16 1807  TSH 7.468*   Anemia Panel: No results for input(s): VITAMINB12, FOLATE, FERRITIN, TIBC, IRON, RETICCTPCT in the last 72 hours. Sepsis Labs:  Recent Labs Lab 05/08/16 1658  LATICACIDVEN 1.47    Recent Results (from the past 240 hour(s))  MRSA PCR Screening     Status: None   Collection Time: 05/08/16  7:32 PM  Result Value Ref Range Status   MRSA by PCR NEGATIVE NEGATIVE Final    Comment:        The GeneXpert MRSA Assay (FDA approved for NASAL specimens only), is one component of a comprehensive MRSA colonization surveillance program. It is not intended to diagnose MRSA infection nor to guide or monitor treatment for MRSA infections.   Respiratory Panel by PCR     Status: None   Collection Time: 05/08/16  7:35 PM  Result Value Ref Range Status   Adenovirus NOT DETECTED NOT DETECTED Final   Coronavirus 229E NOT DETECTED NOT DETECTED Final   Coronavirus HKU1 NOT DETECTED NOT DETECTED Final   Coronavirus NL63 NOT DETECTED NOT DETECTED Final   Coronavirus OC43 NOT DETECTED NOT DETECTED Final   Metapneumovirus NOT DETECTED NOT DETECTED Final   Rhinovirus / Enterovirus NOT  DETECTED NOT DETECTED Final   Influenza A NOT DETECTED NOT DETECTED Final   Influenza B NOT DETECTED NOT DETECTED Final   Parainfluenza Virus 1 NOT DETECTED NOT DETECTED Final   Parainfluenza Virus 2 NOT DETECTED NOT DETECTED Final   Parainfluenza Virus 3 NOT DETECTED NOT DETECTED Final   Parainfluenza Virus 4 NOT DETECTED NOT DETECTED Final   Respiratory Syncytial Virus NOT DETECTED NOT DETECTED Final   Bordetella pertussis NOT DETECTED NOT DETECTED Final   Chlamydophila pneumoniae NOT DETECTED NOT DETECTED Final   Mycoplasma pneumoniae NOT DETECTED NOT DETECTED Final  Culture, sputum-assessment     Status: None   Collection Time: 05/09/16  4:01 AM  Result Value Ref Range Status   Specimen Description EXPECTORATED SPUTUM  Final   Special Requests NONE  Final   Sputum evaluation    Final    Sputum specimen not acceptable for testing.  Please recollect.   Gram Stain Report Called to,Read Back By and Verified With: R CULBREN,RN @0710  05/09/16 MKELLY,MLT   Report Status 05/09/2016 FINAL  Final         Radiology Studies: Dg Chest 2 View  Result Date: 05/08/2016 CLINICAL DATA:  Cough and shortness of breath over the last week. EXAM: CHEST  2 VIEW COMPARISON:  04/13/2016 FINDINGS: Heart size is normal. There is aortic atherosclerosis. There is newly seen basilar volume loss, left more than right. This is consistent with mild basilar pneumonia. Upper lungs are clear. No effusions. No acute bone finding. IMPRESSION: Newly seen volume loss at both lung bases most consistent with mild atelectatic pneumonia. Aortic atherosclerosis. Electronically Signed   By: Paulina Fusi M.D.   On: 05/08/2016 13:05        Scheduled Meds: . amiodarone  200 mg Oral Daily  . atorvastatin  10 mg Oral Daily  . azithromycin  500 mg Intravenous Q24H  . calcium-vitamin D  1 tablet Oral Q breakfast  . cefTRIAXone (ROCEPHIN)  IV  1 g Intravenous Q24H  . chlorhexidine  15 mL Mouth/Throat BID  . cycloSPORINE  1 drop Both Eyes BID  . diltiazem  120 mg Oral Daily  . feeding supplement (GLUCERNA SHAKE)  237 mL Oral q12n4p  . fluticasone  2 spray Each Nare Daily  . furosemide  40 mg Intravenous Q12H  . gabapentin  300 mg Oral QHS  . hydrALAZINE  50 mg Oral TID  . ipratropium-albuterol  3 mL Nebulization Q6H  . isosorbide mononitrate  30 mg Oral Daily  . latanoprost  1 drop Both Eyes QHS  . levothyroxine  137 mcg Oral QAC breakfast  . loratadine  10 mg Oral Daily  . multivitamin with minerals  1 tablet Oral Daily  . pantoprazole  40 mg Oral BID  . polyvinyl alcohol  1 drop Both Eyes QID  . sertraline  100 mg Oral Daily  . warfarin  5 mg Oral ONCE-1800  . Warfarin - Pharmacist Dosing Inpatient   Does not apply q1800   Continuous Infusions: . feeding supplement (OSMOLITE 1.5 CAL) 1,000 mL  (05/08/16 2030)     LOS: 1 day    Dron Jaynie Collins, MD [facility] Pager [phone]  If 7PM-7AM, please contact night-coverage [email or web address] Password [facility] 05/09/2016, 12:24 PM

## 2016-05-10 LAB — PROTIME-INR
INR: 1.41
Prothrombin Time: 17.3 seconds — ABNORMAL HIGH (ref 11.4–15.2)

## 2016-05-10 LAB — BASIC METABOLIC PANEL
Anion gap: 15 (ref 5–15)
BUN: 52 mg/dL — ABNORMAL HIGH (ref 6–20)
CO2: 25 mmol/L (ref 22–32)
Calcium: 9.5 mg/dL (ref 8.9–10.3)
Chloride: 88 mmol/L — ABNORMAL LOW (ref 101–111)
Creatinine, Ser: 1.55 mg/dL — ABNORMAL HIGH (ref 0.44–1.00)
GFR calc Af Amer: 33 mL/min — ABNORMAL LOW (ref >60)
GFR calc non Af Amer: 28 mL/min — ABNORMAL LOW (ref >60)
Glucose, Bld: 140 mg/dL — ABNORMAL HIGH (ref 65–99)
Potassium: 4.2 mmol/L (ref 3.5–5.1)
Sodium: 128 mmol/L — ABNORMAL LOW (ref 135–145)

## 2016-05-10 LAB — GLUCOSE, CAPILLARY
Glucose-Capillary: 103 mg/dL — ABNORMAL HIGH (ref 65–99)
Glucose-Capillary: 112 mg/dL — ABNORMAL HIGH (ref 65–99)
Glucose-Capillary: 135 mg/dL — ABNORMAL HIGH (ref 65–99)
Glucose-Capillary: 145 mg/dL — ABNORMAL HIGH (ref 65–99)
Glucose-Capillary: 148 mg/dL — ABNORMAL HIGH (ref 65–99)
Glucose-Capillary: 174 mg/dL — ABNORMAL HIGH (ref 65–99)
Glucose-Capillary: 220 mg/dL — ABNORMAL HIGH (ref 65–99)

## 2016-05-10 LAB — URINALYSIS, ROUTINE W REFLEX MICROSCOPIC
Bilirubin Urine: NEGATIVE
Glucose, UA: NEGATIVE mg/dL
Hgb urine dipstick: NEGATIVE
Ketones, ur: NEGATIVE mg/dL
Leukocytes, UA: NEGATIVE
Nitrite: NEGATIVE
Protein, ur: NEGATIVE mg/dL
Specific Gravity, Urine: 1.009 (ref 1.005–1.030)
pH: 7 (ref 5.0–8.0)

## 2016-05-10 MED ORDER — BACITRACIN-POLYMYXIN B 500-10000 UNIT/GM OP OINT
TOPICAL_OINTMENT | Freq: Two times a day (BID) | OPHTHALMIC | Status: DC
  Administered 2016-05-10 – 2016-05-17 (×15): via OPHTHALMIC
  Filled 2016-05-10 (×2): qty 3.5

## 2016-05-10 MED ORDER — FUROSEMIDE 40 MG PO TABS
40.0000 mg | ORAL_TABLET | Freq: Two times a day (BID) | ORAL | Status: DC
  Administered 2016-05-11 – 2016-05-12 (×3): 40 mg via ORAL
  Filled 2016-05-10 (×3): qty 1

## 2016-05-10 MED ORDER — WARFARIN SODIUM 5 MG PO TABS
5.0000 mg | ORAL_TABLET | Freq: Once | ORAL | Status: AC
  Administered 2016-05-10: 5 mg via ORAL
  Filled 2016-05-10: qty 1

## 2016-05-10 NOTE — Progress Notes (Signed)
[facility]  Patient Status: Active (receiving services up to time of hospitalization)  [facility] is providing the following services: RN and PT  If patient discharges after hours, please call [phone].   Shannon [name] 05/10/2016, 2:27 PM

## 2016-05-10 NOTE — Care Management Note (Signed)
Case Management Note  Patient Details  Name: Johann Gascoigne MRN: [id number] Date of Birth: [birth year]  Subjective/Objective:     Admitted with Pneumonia               Action/Plan: Patient lives at home with daughter, [name] [phone]). PCP: Gildardo Cranker, DO; has private insurance with Medicare / Roseanna Rainbow with prescription drug coverage; she is active with [facility] as prior to admission for [facility] and PT; CM will continue to follow for DCP.  Expected Discharge Date:   possibly 05/11/2016               Expected Discharge Plan:  [place]  In-House Referral:   [facility]  Discharge planning Services  CM Consult   HH Arranged:  RN, PT [facility] Agency:  [facility]  Status of Service:  In process, will continue to follow  Sherrilyn Rist [phone] 05/10/2016, 10:11 AM

## 2016-05-10 NOTE — Progress Notes (Signed)
Referring Physician(s): Dr. Marzetta Board  Supervising Physician: Aletta Edouard  Patient Status: [facility] - In-pt  Chief Complaint: Clogged J-tube  Subjective: Patient has been admitted secondary to PNA.  She has a GJ tube that is known to become clogged on occasions.  The patient is well-known to the IR service for this issue.  She is currently complaining of eye pain and drainage from her eyes.    Allergies: Penicillins  Medications: Prior to Admission medications   Medication Sig Start Date End Date Taking? Authorizing Provider  acetaminophen (TYLENOL) 325 MG tablet Take 650 mg by mouth 2 (two) times daily as needed for mild pain.    Yes Historical Provider, MD  ADVAIR DISKUS 250-50 MCG/DOSE AEPB INHALE 1 PUFF INTO THE LUNGS 2 (TWO) TIMES DAILY AS NEEDED FOR SHORTNESS OF BREATH 12/01/14  Yes Historical Provider, MD  AMINO ACIDS-PROTEIN HYDROLYS PO Take 30 mLs by mouth 2 (two) times daily.   Yes Historical Provider, MD  amiodarone (PACERONE) 200 MG tablet Take 200 mg by mouth daily.   Yes Historical Provider, MD  atorvastatin (LIPITOR) 10 MG tablet Take 10 mg by mouth daily.   Yes Historical Provider, MD  b complex vitamins tablet Take 1 tablet by mouth daily.    Yes Historical Provider, MD  benzonatate (TESSALON) 200 MG capsule Take 1 capsule (200 mg total) by mouth 3 (three) times daily as needed for cough. 03/24/16  Yes Gildardo Cranker, DO  calcium-vitamin D (OSCAL WITH D) 500-200 MG-UNIT per tablet Take 1 tablet by mouth daily with breakfast.    Yes Historical Provider, MD  cetirizine (ZYRTEC) 10 MG tablet Take 10 mg by mouth daily as needed for allergies or rhinitis.   Yes Historical Provider, MD  chlorhexidine (PERIDEX) 0.12 % solution Use as directed 15 mLs in the mouth or throat 2 (two) times daily.   Yes Historical Provider, MD  diltiazem (CARDIZEM CD) 120 MG 24 hr capsule Take 1 capsule (120 mg total) by mouth daily. 02/23/16 05/23/16 Yes Thompson Grayer, MD  feeding  supplement, GLUCERNA SHAKE, (GLUCERNA SHAKE) LIQD Take 237 mLs by mouth See admin instructions. One to two times a day   Yes Historical Provider, MD  fluticasone (FLONASE) 50 MCG/ACT nasal spray Place 2 sprays into both nostrils daily. 08/07/14  Yes Gildardo Cranker, DO  furosemide (LASIX) 40 MG tablet Take 40 mg by mouth 2 (two) times daily.   Yes Historical Provider, MD  gabapentin (NEURONTIN) 300 MG capsule Take 1 capsule (300 mg total) by mouth 3 (three) times daily. 02/02/16  Yes Arbutus Leas, NP  hydrALAZINE (APRESOLINE) 50 MG tablet Take 1 tablet (50 mg total) by mouth 3 (three) times daily. 11/11/15  Yes Eugenie Filler, MD  HYDROcodone-acetaminophen (NORCO) 5-325 MG tablet Take 1 tablet by mouth every 6 (six) hours as needed for moderate pain. 02/18/16  Yes Monica Carter, DO  ipratropium-albuterol (DUONEB) 0.5-2.5 (3) MG/3ML SOLN Take 3 mLs by nebulization every 6 (six) hours as needed (sob). Use 3 times daily x 4 days then every 6 hours as needed. Patient taking differently: Take 3 mLs by nebulization See admin instructions. Every 4 to 6 hours (up to 6 times a day) 03/21/16  Yes Eileen Stanford, PA-C  isosorbide mononitrate (IMDUR) 30 MG 24 hr tablet TAKE 1 TABLET (30 MG TOTAL) BY MOUTH DAILY. 04/13/16  Yes Dorothy Spark, MD  latanoprost (XALATAN) 0.005 % ophthalmic solution Place 1 drop into both eyes at bedtime. 06/02/14  Yes Mahima  Bubba Camp, MD  levalbuterol [facility] HFA) 45 MCG/ACT inhaler Inhale 2 puffs into the lungs every 6 (six) hours as needed for wheezing or shortness of breath. 02/03/16  Yes Gildardo Cranker, DO  levothyroxine (SYNTHROID, LEVOTHROID) 137 MCG tablet TAKE 1 TABLET (137 MCG TOTAL) BY MOUTH DAILY BEFORE BREAKFAST. 04/04/16  Yes Gildardo Cranker, DO  Maltodextrin-Xanthan Gum (RESOURCE THICKENUP CLEAR) POWD Take 120 g by mouth as needed (use to thicken liquids.). 11/11/15  Yes Eugenie Filler, MD  Multiple Vitamin (MULTIVITAMIN WITH MINERALS) TABS tablet Take 1 tablet by mouth  daily.   Yes Historical Provider, MD  Nutritional Supplements (FEEDING SUPPLEMENT, OSMOLITE 1.5 CAL,) LIQD Place 1,000 mLs into feeding tube daily. 09/30/15  Yes Debbe Odea, MD  pantoprazole (PROTONIX) 40 MG tablet TAKE 1 TABLET BY MOUTH TWICE A DAY 03/09/16  Yes Gildardo Cranker, DO  Polyethyl Glycol-Propyl Glycol 0.4-0.3 % SOLN Place 1 drop into both eyes 4 (four) times daily.    Yes Historical Provider, MD  RESTASIS 0.05 % ophthalmic emulsion USE 1 DROP INTO BOTH EYES TWICE DAILY 11/01/15  Yes Estill Dooms, MD  sertraline (ZOLOFT) 100 MG tablet TAKE ONE TABLET BY MOUTH ONCE DAILY FOR DEPRESSION 04/10/16  Yes Gildardo Cranker, DO  traMADol (ULTRAM) 50 MG tablet Take 50 mg by mouth daily as needed for moderate pain.    Yes Historical Provider, MD  warfarin (COUMADIN) 2.5 MG tablet Take as directed by Coumadin Clinic. Patient taking differently: Take 2.5-3.75 mg by mouth See admin instructions. 3.75 mg at 1800 on Sun/Tues/Wed/Thurs/Fri/Sat and 2.5 mg on Mon 02/16/16  Yes Dorothy Spark, MD  Water For Irrigation, Sterile (FREE WATER) SOLN Place 230 mLs into feeding tube 3 (three) times daily.   Yes Historical Provider, MD  bisacodyl (DULCOLAX) 10 MG suppository Place 1 suppository (10 mg total) rectally daily as needed for moderate constipation. Patient not taking: Reported on 05/08/2016 09/30/15   Debbe Odea, MD  feeding supplement (BOOST / RESOURCE BREEZE) LIQD Take 1 Container by mouth daily. Patient not taking: Reported on 05/08/2016 11/11/15   Eugenie Filler, MD  polyethylene glycol powder (GLYCOLAX/MIRALAX) powder MIX 17GRAMS IN 8 OUNCES OF LIQUID AND DRINK TWICE DAILY AS NEEDED Patient not taking: Reported on 05/08/2016 09/13/15   Gildardo Cranker, DO    Vital Signs: BP (!) 158/67 (BP Location: Left Arm)   Pulse 87   Temp 98.2 F (36.8 C) (Oral)   Resp 16   Ht 5\' 6"  (1.676 m)   Wt 135 lb 14.4 oz (61.6 kg) Comment: scale a  SpO2 98%   BMI 21.93 kg/m   Physical Exam: Abd: GJ tube is in good  position and site is clean.  G-tube flushes well with no issues.  A 3cc syringe with saline was used to flush her J-tube.  After several attempts her J-tube was unclogged.  It was then flushed multiple times to assure that it was open.  It was working well and it was recapped.  Imaging: Dg Chest 2 View  Result Date: 05/08/2016 CLINICAL DATA:  Cough and shortness of breath over the last week. EXAM: CHEST  2 VIEW COMPARISON:  04/13/2016 FINDINGS: Heart size is normal. There is aortic atherosclerosis. There is newly seen basilar volume loss, left more than right. This is consistent with mild basilar pneumonia. Upper lungs are clear. No effusions. No acute bone finding. IMPRESSION: Newly seen volume loss at both lung bases most consistent with mild atelectatic pneumonia. Aortic atherosclerosis. Electronically Signed   By: Elta Guadeloupe  Shogry M.D.   On: 05/08/2016 13:05    Labs:  CBC:  Recent Labs  03/12/16 0741 03/13/16 0514 05/08/16 1241 05/09/16 0953  WBC 8.5 10.0 6.4 6.2  HGB 9.1* 9.4* 10.2* 10.4*  HCT 27.3* 27.7* 30.2* 30.3*  PLT 561* 577* 301 287    COAGS:  Recent Labs  01/27/16 1235 01/27/16 2001 01/28/16 0324  02/08/16 0340  05/01/16 05/08/16 1642 05/09/16 0953 05/10/16 0515  INR 1.20  --  1.20  < > 4.42*  < > 1.5 1.40 1.31 1.41  APTT 66* 88* 74*  --  52*  --   --   --   --   --   < > = values in this interval not displayed.  BMP:  Recent Labs  05/08/16 1053 05/08/16 1241 05/09/16 0953 05/10/16 0515  NA 121* 123* 128* 128*  K 4.6 4.3 3.8 4.2  CL 81* 86* 87* 88*  CO2 26 26 28 25   GLUCOSE 121* 127* 170* 140*  BUN 53* 56* 51* 52*  CALCIUM 9.6 9.6 9.5 9.5  CREATININE 1.59* 1.63* 1.44* 1.55*  GFRNONAA 28* 27* 31* 28*  GFRAA 33* 31* 36* 33*    LIVER FUNCTION TESTS:  Recent Labs  11/06/15 1612 01/24/16 1633 02/07/16 2118 03/02/16 1210  BILITOT 0.5 0.3 0.5 0.5  AST 26 39 24 30  ALT 23 26 41 30  ALKPHOS 49 68 59 61  PROT 7.6 8.1 7.5 6.4*  ALBUMIN 3.6 3.8 3.8  3.4*    Assessment and Plan: 1. Clogged J-tube -the tube has been successfully unclogged. -it is ready for immediate use -only tube feeds and liquid medications down this tube.  Crushed up medications or anything else should be put down her g-tube.  Her J-tube should be flushed immediate after TFs are disconnected and after medications are given. -no further IR intervention.  Electronically Signed: Henreitta Cea 05/10/2016, 10:17 AM   I spent a total of 25 Minutes at the the patient's bedside AND on the patient's hospital floor or unit, greater than 50% of which was counseling/coordinating care for clogged J-tube

## 2016-05-10 NOTE — Progress Notes (Signed)
[facility]  Patient Status: Active (receiving services up to time of hospitalization)  [facility] is providing the following services: RN and PT  If patient discharges after hours, please call [phone].   Shannon [name] 05/10/2016, 9:44 AM

## 2016-05-10 NOTE — Progress Notes (Signed)
Patient with no complaints or concerns during 7pm - 7am shift. Alert and oriented, slept during the night.   Alexis Mizuno, RN

## 2016-05-10 NOTE — Progress Notes (Signed)
[facility]  Patient Status: Pt is ACTIVE [facility] is providing the following services: RN PT   If patient discharges after hours, please call [phone].   [place] Navigator 05/10/2016, 1:41 PM  [phone]

## 2016-05-10 NOTE — Progress Notes (Signed)
PROGRESS NOTE  Shannon [name] ONG:[id number] DOB: [birth year] DOA: 05/08/2016 PCP: Kirt Boys, DO   LOS: 2 days   Brief Narrative: 83 y.o.femalewith medical history significant of persistent atrial fibrillation on amiodarone, bradycardia, CKD stage III,hypothyroidism,non-obstructive coronary artery disease, type 2 diabetes, hypertension, chronic diastolic CHF, stroke, cerebrovascular disease, COPD, asthma, gastric outlet obstruction s/p prior J tube, anemia and hyponatremia, presented with shortness of breath and cough for worsening for about 2 weeks.  Assessment & Plan: Active Problems:   Persistent atrial fibrillation (HCC)   Hypothyroidism   CAP (community acquired pneumonia)   Chronic kidney insufficiency, stage 3 (moderate)   Hyponatremia   Diabetic polyneuropathy associated with type 2 diabetes mellitus (HCC)   Acute on chronic diastolic CHF (congestive heart failure), NYHA class 4 (HCC)   PEG (percutaneous endoscopic gastrostomy) status (HCC)   Possible community acquired pneumonia - Patient placed on ceftriaxone and azithromycin, continue, improving - Respiratory virus panel was negative - Continue supportive care - Remain on IV antibiotics because cannot use PEG tube currently  Likely acute on chronic diastolic congestive heart failure - Diuresed with IV Lasix, weight is improved from 141 >>135, she appears euvolemic, there is mild elevation in creatinine, discontinue IV Lasix today and place her on by mouth at her home dose - 2-D echo done on 05/09/16 and showed ejection fraction of 60-65% - Patient has no chest pain, troponin not elevated. - Continue Imdur, hydralazine  H/o gastric outlet obstruction - With PEG tube, now clumped, IR consulted, appreciate input  Persistent atrial fibrillation / flutter - Continue amiodarone, diltiazem, Coumadin per pharmacy  Acute on chronic hyponatremia likely in the setting of CHF - Serum sodium has remained stable at  128  Chronic kidney disease is stage III  - Serum creatinine level around baseline. Monitor BMP and electrolytes. Repeat lab in the morning.  Hypothyroidism  - Continue Synthroid - TSH 7.4, will need to be rechecked as an outpatient in ~ 3 weeks  Left leg pain, chronic in nature - Ultrasound negative for DVT  Goals of care discussion - DNR   DVT prophylaxis: Coumadin Code Status: DNR Family Communication: no family bedside Disposition Plan: d/c 1 day   Consultants:   None   Procedures:   None   Antimicrobials:  Ceftriaxone and azithromycin 2/5 >>  Subjective: - Feels very bit better, however still short of breath this morning.  Objective: Vitals:   05/10/16 0103 05/10/16 0349 05/10/16 0629 05/10/16 0805  BP: (!) 143/51  (!) 158/67   Pulse: 78  87   Resp: 18  16   Temp: 98 F (36.7 C)  98.2 F (36.8 C)   TempSrc: Oral  Oral   SpO2: 98%  96% 98%  Weight:  61.6 kg (135 lb 14.4 oz)    Height:        Intake/Output Summary (Last 24 hours) at 05/10/16 1048 Last data filed at 05/10/16 0905  Gross per 24 hour  Intake              760 ml  Output             1250 ml  Net             -490 ml   Filed Weights   05/08/16 1842 05/09/16 0453 05/10/16 0349  Weight: 63 kg (139 lb) 63 kg (139 lb) 61.6 kg (135 lb 14.4 oz)    Examination: Constitutional: NAD Vitals:   05/10/16 0103 05/10/16 4401 05/10/16 0272 05/10/16 5366  BP: (!) 143/51  (!) 158/67   Pulse: 78  87   Resp: 18  16   Temp: 98 F (36.7 C)  98.2 F (36.8 C)   TempSrc: Oral  Oral   SpO2: 98%  96% 98%  Weight:  61.6 kg (135 lb 14.4 oz)    Height:       Eyes: PERRL, eyes with crusty discharge ENMT: Mucous membranes are moist.  Neck: normal, supple, no masses Respiratory: clear to auscultation bilaterally, no wheezing, no crackles. Normal respiratory effort. No accessory muscle use.  Cardiovascular: Regular rate and rhythm, no murmurs / rubs / gallops. Trace LE edema. 2+ pedal pulses.   Abdomen: no tenderness. Bowel sounds positive.  Musculoskeletal: no clubbing / cyanosis. Neurologic: non focal    Data Reviewed: I have personally reviewed following labs and imaging studies  CBC:  Recent Labs Lab 05/08/16 1241 05/09/16 0953  WBC 6.4 6.2  HGB 10.2* 10.4*  HCT 30.2* 30.3*  MCV 81.0 81.7  PLT 301 287   Basic Metabolic Panel:  Recent Labs Lab 05/08/16 1053 05/08/16 1241 05/09/16 0953 05/10/16 0515  NA 121* 123* 128* 128*  K 4.6 4.3 3.8 4.2  CL 81* 86* 87* 88*  CO2 26 26 28 25   GLUCOSE 121* 127* 170* 140*  BUN 53* 56* 51* 52*  CREATININE 1.59* 1.63* 1.44* 1.55*  CALCIUM 9.6 9.6 9.5 9.5   Coagulation Profile:  Recent Labs Lab 05/08/16 1642 05/09/16 0953 05/10/16 0515  INR 1.40 1.31 1.41   BNP (last 3 results)  Recent Labs  05/08/16 1053  PROBNP 1,132*   CBG:  Recent Labs Lab 05/09/16 2247 05/10/16 0035 05/10/16 0500 05/10/16 0626 05/10/16 0750  GLUCAP 128* 103* 148* 135* 145*   Thyroid Function Tests:  Recent Labs  05/08/16 1807  TSH 7.468*   Urine analysis:    Component Value Date/Time   COLORURINE AMBER (A) 05/08/2016 1652   APPEARANCEUR CLOUDY (A) 05/08/2016 1652   LABSPEC 1.009 05/08/2016 1652   PHURINE 8.0 05/08/2016 1652   GLUCOSEU NEGATIVE 05/08/2016 1652   HGBUR SMALL (A) 05/08/2016 1652   BILIRUBINUR NEGATIVE 05/08/2016 1652   KETONESUR NEGATIVE 05/08/2016 1652   PROTEINUR NEGATIVE 05/08/2016 1652   UROBILINOGEN 0.2 01/27/2015 2308   NITRITE NEGATIVE 05/08/2016 1652   LEUKOCYTESUR LARGE (A) 05/08/2016 1652   Sepsis Labs: Invalid input(s): PROCALCITONIN, LACTICIDVEN  Recent Results (from the past 240 hour(s))  Blood Culture (routine x 2)     Status: None (Preliminary result)   Collection Time: 05/08/16  4:30 PM  Result Value Ref Range Status   Specimen Description BLOOD LEFT FOREARM  Final   Special Requests IN PEDIATRIC BOTTLE 1CC  Final   Culture NO GROWTH < 24 HOURS  Final   Report Status PENDING   Incomplete  Blood Culture (routine x 2)     Status: None (Preliminary result)   Collection Time: 05/08/16  4:35 PM  Result Value Ref Range Status   Specimen Description BLOOD RIGHT HAND  Final   Special Requests BOTTLES DRAWN AEROBIC AND ANAEROBIC 5CC  Final   Culture NO GROWTH < 24 HOURS  Final   Report Status PENDING  Incomplete  MRSA PCR Screening     Status: None   Collection Time: 05/08/16  7:32 PM  Result Value Ref Range Status   MRSA by PCR NEGATIVE NEGATIVE Final    Comment:        The GeneXpert MRSA Assay (FDA approved for NASAL specimens only), is one component  of a comprehensive MRSA colonization surveillance program. It is not intended to diagnose MRSA infection nor to guide or monitor treatment for MRSA infections.   Respiratory Panel by PCR     Status: None   Collection Time: 05/08/16  7:35 PM  Result Value Ref Range Status   Adenovirus NOT DETECTED NOT DETECTED Final   Coronavirus 229E NOT DETECTED NOT DETECTED Final   Coronavirus HKU1 NOT DETECTED NOT DETECTED Final   Coronavirus NL63 NOT DETECTED NOT DETECTED Final   Coronavirus OC43 NOT DETECTED NOT DETECTED Final   Metapneumovirus NOT DETECTED NOT DETECTED Final   Rhinovirus / Enterovirus NOT DETECTED NOT DETECTED Final   Influenza A NOT DETECTED NOT DETECTED Final   Influenza B NOT DETECTED NOT DETECTED Final   Parainfluenza Virus 1 NOT DETECTED NOT DETECTED Final   Parainfluenza Virus 2 NOT DETECTED NOT DETECTED Final   Parainfluenza Virus 3 NOT DETECTED NOT DETECTED Final   Parainfluenza Virus 4 NOT DETECTED NOT DETECTED Final   Respiratory Syncytial Virus NOT DETECTED NOT DETECTED Final   Bordetella pertussis NOT DETECTED NOT DETECTED Final   Chlamydophila pneumoniae NOT DETECTED NOT DETECTED Final   Mycoplasma pneumoniae NOT DETECTED NOT DETECTED Final  Culture, sputum-assessment     Status: None   Collection Time: 05/09/16  4:01 AM  Result Value Ref Range Status   Specimen Description  EXPECTORATED SPUTUM  Final   Special Requests NONE  Final   Sputum evaluation   Final    Sputum specimen not acceptable for testing.  Please recollect.   Gram Stain Report Called to,Read Back By and Verified With: R CULBREN,RN @0710  05/09/16 MKELLY,MLT   Report Status 05/09/2016 FINAL  Final      Radiology Studies: Dg Chest 2 View  Result Date: 05/08/2016 CLINICAL DATA:  Cough and shortness of breath over the last week. EXAM: CHEST  2 VIEW COMPARISON:  04/13/2016 FINDINGS: Heart size is normal. There is aortic atherosclerosis. There is newly seen basilar volume loss, left more than right. This is consistent with mild basilar pneumonia. Upper lungs are clear. No effusions. No acute bone finding. IMPRESSION: Newly seen volume loss at both lung bases most consistent with mild atelectatic pneumonia. Aortic atherosclerosis. Electronically Signed   By: Paulina Fusi M.D.   On: 05/08/2016 13:05     Scheduled Meds: . amiodarone  200 mg Oral Daily  . atorvastatin  10 mg Oral Daily  . azithromycin  500 mg Intravenous Q24H  . bacitracin-polymyxin b   Both Eyes BID  . calcium-vitamin D  1 tablet Oral Q breakfast  . cefTRIAXone (ROCEPHIN)  IV  1 g Intravenous Q24H  . chlorhexidine  15 mL Mouth/Throat BID  . cycloSPORINE  1 drop Both Eyes BID  . diltiazem  120 mg Oral Daily  . feeding supplement (GLUCERNA SHAKE)  237 mL Oral q12n4p  . feeding supplement (PRO-STAT SUGAR FREE 64)  30 mL Per Tube Daily  . fluticasone  2 spray Each Nare Daily  . furosemide  40 mg Intravenous Q12H  . gabapentin  300 mg Oral QHS  . hydrALAZINE  50 mg Oral TID  . insulin aspart  0-9 Units Subcutaneous TID WC  . ipratropium-albuterol  3 mL Nebulization TID  . isosorbide mononitrate  30 mg Oral Daily  . latanoprost  1 drop Both Eyes QHS  . levothyroxine  137 mcg Oral QAC breakfast  . loratadine  10 mg Oral Daily  . multivitamin with minerals  1 tablet Oral Daily  .  pantoprazole  40 mg Oral BID  . polyvinyl alcohol  1  drop Both Eyes QID  . sertraline  100 mg Oral Daily  . Warfarin - Pharmacist Dosing Inpatient   Does not apply q1800   Continuous Infusions: . feeding supplement (OSMOLITE 1.5 CAL) Stopped (05/09/16 2000)    Pamella Pert, MD, PhD [facility] Pager [phone]  If 7PM-7AM, please contact night-coverage [email or web address] Password [facility] 05/10/2016, 10:48 AM

## 2016-05-10 NOTE — Progress Notes (Signed)
Informed by day shift nurse that feeding tube is blocked, unable to continue tube feedings. IR radiology and Evaulation ordered. Will continue to monitor.  Braxton Weisbecker, RN

## 2016-05-10 NOTE — Progress Notes (Signed)
[place] for warfarin Indication: atrial fibrillation  Allergies  Allergen Reactions  . Penicillins Itching and Rash    Has patient had a PCN reaction causing immediate rash, facial/tongue/throat swelling, SOB or lightheadedness with hypotension: Yes Has patient had a PCN reaction causing severe rash involving mucus membranes or skin necrosis: Unk Has patient had a PCN reaction that required hospitalization: Unk Has patient had a PCN reaction occurring within the last 10 years: Unk If all of the above answers are "NO", then may proceed with Cephalosporin use.     Patient Measurements: Height: 5\' 6"  (167.6 cm) Weight: 135 lb 14.4 oz (61.6 kg) (scale a) IBW/kg (Calculated) : 59.3 Heparin Dosing Weight: n/a  Vital Signs: Temp: 98.2 F (36.8 C) (02/07 0629) Temp Source: Oral (02/07 0629) BP: 158/67 (02/07 0629) Pulse Rate: 87 (02/07 0629)  Labs:  Recent Labs  05/08/16 1241 05/08/16 1642 05/09/16 0953 05/10/16 0515  HGB 10.2*  --  10.4*  --   HCT 30.2*  --  30.3*  --   PLT 301  --  287  --   LABPROT  --  17.3* 16.4* 17.3*  INR  --  1.40 1.31 1.41  CREATININE 1.63*  --  1.44* 1.55*    Estimated Creatinine Clearance: 22.6 mL/min (by C-G formula based on SCr of 1.55 mg/dL (H)).   Assessment: 55 yoF on warfarin PTA for atrial fibrillation to resume per pharmacy on admit.  INR = 1.41 (no doses on 2/5 / 2/6)  PTA warfarin dose = 3.75mg  daily except 2.5mg  on Mondays  Goal of Therapy:  INR 2-3 Monitor platelets by anticoagulation protocol: Yes   Plan:  -Warfarin 5 mg po x 1 tonight -Monitor INR, S/Sx bleeding daily  Thank you Anette Guarneri, PharmD [phone] 05/10/2016

## 2016-05-11 ENCOUNTER — Inpatient Hospital Stay (HOSPITAL_COMMUNITY): Payer: Medicare Other

## 2016-05-11 ENCOUNTER — Ambulatory Visit: Payer: Medicare Other | Admitting: Cardiology

## 2016-05-11 DIAGNOSIS — I5033 Acute on chronic diastolic (congestive) heart failure: Secondary | ICD-10-CM

## 2016-05-11 DIAGNOSIS — N183 Chronic kidney disease, stage 3 (moderate): Secondary | ICD-10-CM

## 2016-05-11 DIAGNOSIS — E871 Hypo-osmolality and hyponatremia: Secondary | ICD-10-CM

## 2016-05-11 DIAGNOSIS — I481 Persistent atrial fibrillation: Secondary | ICD-10-CM

## 2016-05-11 DIAGNOSIS — E039 Hypothyroidism, unspecified: Secondary | ICD-10-CM

## 2016-05-11 LAB — BLOOD GAS, ARTERIAL
Acid-Base Excess: 3.7 mmol/L — ABNORMAL HIGH (ref 0.0–2.0)
Bicarbonate: 27 mmol/L (ref 20.0–28.0)
Drawn by: 257881
O2 Content: 2 L/min
O2 Saturation: 92.4 %
Patient temperature: 98.6
pCO2 arterial: 35.5 mmHg (ref 32.0–48.0)
pH, Arterial: 7.493 — ABNORMAL HIGH (ref 7.350–7.450)
pO2, Arterial: 65.3 mmHg — ABNORMAL LOW (ref 83.0–108.0)

## 2016-05-11 LAB — BASIC METABOLIC PANEL
Anion gap: 13 (ref 5–15)
BUN: 47 mg/dL — ABNORMAL HIGH (ref 6–20)
CO2: 26 mmol/L (ref 22–32)
Calcium: 9.5 mg/dL (ref 8.9–10.3)
Chloride: 88 mmol/L — ABNORMAL LOW (ref 101–111)
Creatinine, Ser: 1.59 mg/dL — ABNORMAL HIGH (ref 0.44–1.00)
GFR calc Af Amer: 32 mL/min — ABNORMAL LOW (ref >60)
GFR calc non Af Amer: 27 mL/min — ABNORMAL LOW (ref >60)
Glucose, Bld: 130 mg/dL — ABNORMAL HIGH (ref 65–99)
Potassium: 3.7 mmol/L (ref 3.5–5.1)
Sodium: 127 mmol/L — ABNORMAL LOW (ref 135–145)

## 2016-05-11 LAB — GLUCOSE, CAPILLARY
Glucose-Capillary: 130 mg/dL — ABNORMAL HIGH (ref 65–99)
Glucose-Capillary: 182 mg/dL — ABNORMAL HIGH (ref 65–99)
Glucose-Capillary: 213 mg/dL — ABNORMAL HIGH (ref 65–99)
Glucose-Capillary: 182 mg/dL — ABNORMAL HIGH (ref 65–99)

## 2016-05-11 LAB — PROTIME-INR
INR: 1.36
Prothrombin Time: 16.9 seconds — ABNORMAL HIGH (ref 11.4–15.2)

## 2016-05-11 MED ORDER — HYDROCOD POLST-CPM POLST ER 10-8 MG/5ML PO SUER
2.5000 mL | ORAL | Status: AC
  Administered 2016-05-11: 2.5 mL via ORAL
  Filled 2016-05-11: qty 5

## 2016-05-11 MED ORDER — FUROSEMIDE 10 MG/ML IJ SOLN: 20.0000 mg | Freq: Once | INTRAMUSCULAR | Status: DC

## 2016-05-11 MED ORDER — WARFARIN SODIUM 7.5 MG PO TABS
7.5000 mg | ORAL_TABLET | Freq: Once | ORAL | Status: AC
  Administered 2016-05-11: 7.5 mg via ORAL
  Filled 2016-05-11: qty 1

## 2016-05-11 MED ORDER — BISOPROLOL FUMARATE 5 MG PO TABS
5.0000 mg | ORAL_TABLET | Freq: Every day | ORAL | Status: DC
  Administered 2016-05-11 – 2016-05-17 (×7): 5 mg via ORAL
  Filled 2016-05-11 (×7): qty 1

## 2016-05-11 NOTE — Progress Notes (Signed)
[place] for warfarin Indication: atrial fibrillation  Allergies  Allergen Reactions  . Penicillins Itching and Rash    Has patient had a PCN reaction causing immediate rash, facial/tongue/throat swelling, SOB or lightheadedness with hypotension: Yes Has patient had a PCN reaction causing severe rash involving mucus membranes or skin necrosis: Unk Has patient had a PCN reaction that required hospitalization: Unk Has patient had a PCN reaction occurring within the last 10 years: Unk If all of the above answers are "NO", then may proceed with Cephalosporin use.     Patient Measurements: Height: 5\' 6"  (167.6 cm) Weight: 135 lb 14.4 oz (61.6 kg) (scale a) IBW/kg (Calculated) : 59.3 Heparin Dosing Weight: n/a  Vital Signs: Temp: 97.7 F (36.5 C) (02/08 0444) Temp Source: Oral (02/08 0444) BP: 124/57 (02/08 0444) Pulse Rate: 97 (02/08 0444)  Labs:  Recent Labs  05/08/16 1241  05/09/16 0953 05/10/16 0515 05/11/16 0545  HGB 10.2*  --  10.4*  --   --   HCT 30.2*  --  30.3*  --   --   PLT 301  --  287  --   --   LABPROT  --   < > 16.4* 17.3* 16.9*  INR  --   < > 1.31 1.41 1.36  CREATININE 1.63*  --  1.44* 1.55* 1.59*  < > = values in this interval not displayed.  Estimated Creatinine Clearance: 22 mL/min (by C-G formula based on SCr of 1.59 mg/dL (H)).   Assessment: 83 yoF on warfarin PTA for atrial fibrillation to resume per pharmacy on admit.  INR = 1.36 (no doses on 2/5 / 2/6)  PTA warfarin dose = 3.75mg  daily except 2.5mg  on Mondays  Goal of Therapy:  INR 2-3 Monitor platelets by anticoagulation protocol: Yes   Plan:  -Warfarin 7.5 mg po x 1 tonight -Monitor INR, S/Sx bleeding daily  Thank you Anette Guarneri, PharmD [phone]

## 2016-05-11 NOTE — Consult Note (Signed)
CARDIOLOGY CONSULT NOTE   Patient ID: Shannon [name] MRN: [id number] DOB/AGE: [birth year] 83 y.o.  Admit date: 05/08/2016  Requesting Physician: Dr. Wyonia Hough  Primary Physician:   Kirt Boys, DO Primary Cardiologist:  Dr Delton See  Reason for Consultation: CHF/ SOB  HPI: Shannon [name] is a 83 y.o. female with a history of persistent atrial fibrillation on amiodarone, bradycardia, CKD stage III,hypothyroidism,non-obstructive coronary artery disease, type 2 diabetes, hypertension, chronic diastolic CHF, stroke, cerebrovascular disease, COPD, asthma, gastric outlet obstruction s/p prior J tube, anemia and hyponatremia who was admitted from office on 05/08/16 for productive cough and SOB. IM admitted through ER for possible PNA and IV diuresis. Patient remains SOB and cardiology for further evaluation and management.   She has had multiple recent hospitalizations, she was admitted in 11/2015 with bradycardia and weakness with HRs in the 30s-40s. Her labetololwas discontinued and amiodarone was reduced from 200 mg daily to 100 mg daily. She was seen in the office 01/24/16 extremely SOB with rapid atrial flutter and hypoxia. She was admitted to [facility] and upon arrival she required Bipap and IV Lasix with rapid improvement with brisk UOP. She converted to NSR on amiodarone drip. She was also noted to have a LLE DVT. Her Eliquis was held and she was started on IV heparin. Hematology saw her and recommended Xarelto but with her GFR <30, Coumadin was started. Her amiodarone was increased to 200mg  BID x 2 weeks with plans to decrease to 200mg  daily as OP if remaining in NSR. IR also saw her for J tube obstruction. 2D Echo 01/27/16 showed EF 55-60%, grade 1 DD, mild AI/MR, mod LAE.   Unfortunately she was re-admitted 11/6-11/11/17 with worsening SOB and chest pain with fever, leukocytosis, tachycardia, AKI on CKD, and was treated for sepsis due to UTI. Her Coumadin was held due to  supratherapeutic range. EKG on 02/07/16 showed atrial flutter.  She was admitted from the office on 03/02/16 for acute on chronic diastolic CHF. Diuresed 5 L and 21 lbs. Discharge weight 133.Treated for aspiration PNA, told to go back on thickened liquid diet. Discharged on Lasix 40mg  daily. Palliative care was consulted. She was sent home with palliative care services. Limited code.  I saw her in clinic for follow up on 03/21/16 and she was doing relatively well. Her weight was up ~142 and HR 106, but since she was feeling so good, we decided to continued observation.   I saw her back in the clinic on 05/08/16 for weight gain and cough. She looked very sickly and unconformable. She has had a worsening productive cough with purulent looking sputum. No beds were available so she was admitted through the ER. Internal medicine admitted and treated for PNA. Diuresed 6 lbs. Still coughing up a lot of sputum and feeling SOB. Daughter demands cardiology consult. She still has cough and SOB, breathing a little better. Some left sided chest pain. No LE edema, orthopnea or pND. No dizziness or syncope.    Past Medical History:  Diagnosis Date  . Anemia    previous blood transfusions  . Arthritis    "all over"  . Asthma   . Bradycardia    requiring previous d/c of BB and reduction of amiodarone  . CAD (coronary artery disease)    nonobstructive per notes  . Chronic diastolic CHF (congestive heart failure) (HCC)   . CKD (chronic kidney disease), stage III   . Complication of blood transfusion    "got the wrong  blood type at [facility] in ~ 2015; no adverse reaction that we are aware of"/daughter, [name] (01/27/2016)  . COPD (chronic obstructive pulmonary disease) (HCC)   . Depression    "light case"  . DVT (deep venous thrombosis) (HCC) 01/2016   a. LLE DVT 01/2016 - switched from Eliquis to Coumadin.  . Gastric stenosis    a. s/p stomach tube  . GERD (gastroesophageal reflux disease)   . History  of blood transfusion    "several" (01/27/2016)  . History of stomach ulcers   . Hyperlipidemia   . Hypertension   . Hypothyroidism   . Paraesophageal hernia   . Perforated gastric ulcer (HCC)   . Seasonal allergies   . SIADH (syndrome of inappropriate ADH production) (HCC)    Shannon [name] 01/10/2015  . Small bowel obstruction    "I don't know how many" (01/11/2015)  . Stroke (HCC)    "light one"  . Type II diabetes mellitus (HCC)    "related to prednisone use  for > 20 yr; once predinose stopped; no more DM RX" (01/27/2016)  . Ventral hernia with bowel obstruction      Past Surgical History:  Procedure Laterality Date  . CATARACT EXTRACTION W/ INTRAOCULAR LENS  IMPLANT, BILATERAL    . CHOLECYSTECTOMY OPEN    . COLECTOMY    . ESOPHAGOGASTRODUODENOSCOPY N/A 01/19/2014   Procedure: ESOPHAGOGASTRODUODENOSCOPY (EGD);  Surgeon: Hilarie Fredrickson, MD;  Location: [facility];  Service: Endoscopy;  Laterality: N/A;  . ESOPHAGOGASTRODUODENOSCOPY N/A 01/20/2014   Procedure: ESOPHAGOGASTRODUODENOSCOPY (EGD);  Surgeon: Hilarie Fredrickson, MD;  Location: [facility];  Service: Endoscopy;  Laterality: N/A;  . ESOPHAGOGASTRODUODENOSCOPY N/A 03/19/2014   Procedure: ESOPHAGOGASTRODUODENOSCOPY (EGD);  Surgeon: Rachael Fee, MD;  Location: [facility];  Service: Endoscopy;  Laterality: N/A;  . ESOPHAGOGASTRODUODENOSCOPY N/A 07/08/2015   Procedure: ESOPHAGOGASTRODUODENOSCOPY (EGD);  Surgeon: Sherrilyn Rist, MD;  Location: [facility] ENDOSCOPY;  Service: Endoscopy;  Laterality: N/A;  . ESOPHAGOGASTRODUODENOSCOPY (EGD) WITH PROPOFOL N/A 09/15/2015   Procedure: ESOPHAGOGASTRODUODENOSCOPY (EGD) WITH PROPOFOL;  Surgeon: Ruffin Frederick, MD;  Location: [facility] ENDOSCOPY;  Service: Gastroenterology;  Laterality: N/A;  . GASTROJEJUNOSTOMY     hx/notes 01/10/2015  . GASTROJEJUNOSTOMY N/A 09/23/2015   Procedure: OPEN GASTROJEJUNOSTOMY TUBE PLACEMENT;  Surgeon: De Blanch Kinsinger, MD;  Location: [facility] ORS;  Service: General;   Laterality: N/A;  . GLAUCOMA SURGERY Bilateral   . HERNIA REPAIR  2015  . IR GENERIC HISTORICAL  01/07/2016   IR GJ TUBE CHANGE 01/07/2016 Malachy Moan, MD [facility]-INTER[facility] RAD  . IR GENERIC HISTORICAL  01/27/2016   IR MECH REMOV OBSTRUC MAT ANY COLON TUBE W/FLUORO 01/27/2016 Richarda Overlie, MD [facility]  . IR GENERIC HISTORICAL  02/07/2016   IR PATIENT EVAL TECH 0-60 MINS Darrell K Allred, PA-C [facility]  . IR GENERIC HISTORICAL  02/08/2016   IR GJ TUBE CHANGE 02/08/2016 Berdine Dance, MD [facility]  . IR GENERIC HISTORICAL  01/06/2016   IR GJ TUBE CHANGE 01/06/2016 [facility]-RAD OUT REF  . IR GENERIC HISTORICAL  05/02/2016   IR CM INJ ANY COLONIC TUBE W/FLUORO 05/02/2016 Oley Balm, MD [facility]  . LAPAROTOMY N/A 01/20/2015   Procedure: EXPLORATORY LAPAROTOMY;  Surgeon: Abigail Miyamoto, MD;  Location: [facility] OR;  Service: General;  Laterality: N/A;  . LYSIS OF ADHESION N/A 01/20/2015   Procedure: LYSIS OF ADHESIONS < 1 HOUR;  Surgeon: Abigail Miyamoto, MD;  Location: [facility] OR;  Service: General;  Laterality: N/A;  . TONSILLECTOMY    . TUBAL LIGATION    .  VENTRAL HERNIA REPAIR  2015   incarcerated ventral hernia ([facility] 09/2013)/notes 01/10/2015    Allergies  Allergen Reactions  . Penicillins Itching and Rash    Has patient had a PCN reaction causing immediate rash, facial/tongue/throat swelling, SOB or lightheadedness with hypotension: Yes Has patient had a PCN reaction causing severe rash involving mucus membranes or skin necrosis: Unk Has patient had a PCN reaction that required hospitalization: Unk Has patient had a PCN reaction occurring within the last 10 years: Unk If all of the above answers are "NO", then may proceed with Cephalosporin use.     I have reviewed the patient's current medications . amiodarone  200 mg Oral Daily  . atorvastatin  10 mg Oral Daily  . azithromycin  500 mg Intravenous Q24H  . bacitracin-polymyxin b   Both Eyes BID  . calcium-vitamin D  1 tablet Oral Q  breakfast  . cefTRIAXone (ROCEPHIN)  IV  1 g Intravenous Q24H  . chlorhexidine  15 mL Mouth/Throat BID  . cycloSPORINE  1 drop Both Eyes BID  . diltiazem  120 mg Oral Daily  . feeding supplement (GLUCERNA SHAKE)  237 mL Oral q12n4p  . feeding supplement (PRO-STAT SUGAR FREE 64)  30 mL Per Tube Daily  . fluticasone  2 spray Each Nare Daily  . furosemide  40 mg Oral BID  . gabapentin  300 mg Oral QHS  . hydrALAZINE  50 mg Oral TID  . insulin aspart  0-9 Units Subcutaneous TID WC  . ipratropium-albuterol  3 mL Nebulization TID  . isosorbide mononitrate  30 mg Oral Daily  . latanoprost  1 drop Both Eyes QHS  . levothyroxine  137 mcg Oral QAC breakfast  . loratadine  10 mg Oral Daily  . multivitamin with minerals  1 tablet Oral Daily  . pantoprazole  40 mg Oral BID  . polyvinyl alcohol  1 drop Both Eyes QID  . sertraline  100 mg Oral Daily  . warfarin  7.5 mg Oral ONCE-1800  . Warfarin - Pharmacist Dosing Inpatient   Does not apply q1800   . feeding supplement (OSMOLITE 1.5 CAL) Stopped (05/09/16 2000)   acetaminophen, albuterol, benzonatate, bisacodyl, HYDROcodone-acetaminophen, RESOURCE THICKENUP CLEAR, traMADol  Prior to Admission medications   Medication Sig Start Date End Date Taking? Authorizing Provider  acetaminophen (TYLENOL) 325 MG tablet Take 650 mg by mouth 2 (two) times daily as needed for mild pain.    Yes Historical Provider, MD  ADVAIR DISKUS 250-50 MCG/DOSE AEPB INHALE 1 PUFF INTO THE LUNGS 2 (TWO) TIMES DAILY AS NEEDED FOR SHORTNESS OF BREATH 12/01/14  Yes Historical Provider, MD  AMINO ACIDS-PROTEIN HYDROLYS PO Take 30 mLs by mouth 2 (two) times daily.   Yes Historical Provider, MD  amiodarone (PACERONE) 200 MG tablet Take 200 mg by mouth daily.   Yes Historical Provider, MD  atorvastatin (LIPITOR) 10 MG tablet Take 10 mg by mouth daily.   Yes Historical Provider, MD  b complex vitamins tablet Take 1 tablet by mouth daily.    Yes Historical Provider, MD  benzonatate  (TESSALON) 200 MG capsule Take 1 capsule (200 mg total) by mouth 3 (three) times daily as needed for cough. 03/24/16  Yes Kirt Boys, DO  calcium-vitamin D (OSCAL WITH D) 500-200 MG-UNIT per tablet Take 1 tablet by mouth daily with breakfast.    Yes Historical Provider, MD  cetirizine (ZYRTEC) 10 MG tablet Take 10 mg by mouth daily as needed for allergies or rhinitis.   Yes Historical  Provider, MD  chlorhexidine (PERIDEX) 0.12 % solution Use as directed 15 mLs in the mouth or throat 2 (two) times daily.   Yes Historical Provider, MD  diltiazem (CARDIZEM CD) 120 MG 24 hr capsule Take 1 capsule (120 mg total) by mouth daily. 02/23/16 05/23/16 Yes Hillis Range, MD  feeding supplement, GLUCERNA SHAKE, (GLUCERNA SHAKE) LIQD Take 237 mLs by mouth See admin instructions. One to two times a day   Yes Historical Provider, MD  fluticasone (FLONASE) 50 MCG/ACT nasal spray Place 2 sprays into both nostrils daily. 08/07/14  Yes Kirt Boys, DO  furosemide (LASIX) 40 MG tablet Take 40 mg by mouth 2 (two) times daily.   Yes Historical Provider, MD  gabapentin (NEURONTIN) 300 MG capsule Take 1 capsule (300 mg total) by mouth 3 (three) times daily. 02/02/16  Yes Little Ishikawa, NP  hydrALAZINE (APRESOLINE) 50 MG tablet Take 1 tablet (50 mg total) by mouth 3 (three) times daily. 11/11/15  Yes Rodolph Bong, MD  HYDROcodone-acetaminophen (NORCO) 5-325 MG tablet Take 1 tablet by mouth every 6 (six) hours as needed for moderate pain. 02/18/16  Yes Monica Carter, DO  ipratropium-albuterol (DUONEB) 0.5-2.5 (3) MG/3ML SOLN Take 3 mLs by nebulization every 6 (six) hours as needed (sob). Use 3 times daily x 4 days then every 6 hours as needed. Patient taking differently: Take 3 mLs by nebulization See admin instructions. Every 4 to 6 hours (up to 6 times a day) 03/21/16  Yes Janetta Hora, PA-C  isosorbide mononitrate (IMDUR) 30 MG 24 hr tablet TAKE 1 TABLET (30 MG TOTAL) BY MOUTH DAILY. 04/13/16  Yes Lars Masson, MD   latanoprost (XALATAN) 0.005 % ophthalmic solution Place 1 drop into both eyes at bedtime. 06/02/14  Yes Mahima Glade Lloyd, MD  levalbuterol [facility] HFA) 45 MCG/ACT inhaler Inhale 2 puffs into the lungs every 6 (six) hours as needed for wheezing or shortness of breath. 02/03/16  Yes Kirt Boys, DO  levothyroxine (SYNTHROID, LEVOTHROID) 137 MCG tablet TAKE 1 TABLET (137 MCG TOTAL) BY MOUTH DAILY BEFORE BREAKFAST. 04/04/16  Yes Kirt Boys, DO  Maltodextrin-Xanthan Gum (RESOURCE THICKENUP CLEAR) POWD Take 120 g by mouth as needed (use to thicken liquids.). 11/11/15  Yes Rodolph Bong, MD  Multiple Vitamin (MULTIVITAMIN WITH MINERALS) TABS tablet Take 1 tablet by mouth daily.   Yes Historical Provider, MD  Nutritional Supplements (FEEDING SUPPLEMENT, OSMOLITE 1.5 CAL,) LIQD Place 1,000 mLs into feeding tube daily. 09/30/15  Yes Calvert Cantor, MD  pantoprazole (PROTONIX) 40 MG tablet TAKE 1 TABLET BY MOUTH TWICE A DAY 03/09/16  Yes Kirt Boys, DO  Polyethyl Glycol-Propyl Glycol 0.4-0.3 % SOLN Place 1 drop into both eyes 4 (four) times daily.    Yes Historical Provider, MD  RESTASIS 0.05 % ophthalmic emulsion USE 1 DROP INTO BOTH EYES TWICE DAILY 11/01/15  Yes Kimber Relic, MD  sertraline (ZOLOFT) 100 MG tablet TAKE ONE TABLET BY MOUTH ONCE DAILY FOR DEPRESSION 04/10/16  Yes Kirt Boys, DO  traMADol (ULTRAM) 50 MG tablet Take 50 mg by mouth daily as needed for moderate pain.    Yes Historical Provider, MD  warfarin (COUMADIN) 2.5 MG tablet Take as directed by Coumadin Clinic. Patient taking differently: Take 2.5-3.75 mg by mouth See admin instructions. 3.75 mg at 1800 on Sun/Tues/Wed/Thurs/Fri/Sat and 2.5 mg on Mon 02/16/16  Yes Lars Masson, MD  Water For Irrigation, Sterile (FREE WATER) SOLN Place 230 mLs into feeding tube 3 (three) times daily.   Yes Historical  Provider, MD  bisacodyl (DULCOLAX) 10 MG suppository Place 1 suppository (10 mg total) rectally daily as needed for moderate  constipation. Patient not taking: Reported on 05/08/2016 09/30/15   Calvert Cantor, MD  feeding supplement (BOOST / RESOURCE BREEZE) LIQD Take 1 Container by mouth daily. Patient not taking: Reported on 05/08/2016 11/11/15   Rodolph Bong, MD  polyethylene glycol powder (GLYCOLAX/MIRALAX) powder MIX 17GRAMS IN 8 OUNCES OF LIQUID AND DRINK TWICE DAILY AS NEEDED Patient not taking: Reported on 05/08/2016 09/13/15   Kirt Boys, DO     Social History   Social History  . Marital status: Widowed    Spouse name: N/A  . Number of children: 3  . Years of education: N/A   Occupational History  . Not on file.   Social History Main Topics  . Smoking status: Never Smoker  . Smokeless tobacco: Former Neurosurgeon    Types: Snuff     Comment: "used snuff in my younger days"  . Alcohol use No  . Drug use: No  . Sexual activity: No   Other Topics Concern  . Not on file   Social History Narrative   Married in [place], lives in one story house with 2 people and no pets   Occupation: ?   Has a living Will, [place], and doesn't have DNR   Was living with Husband (in frail health) in [place] until 09/2013.  After her [facility] admission they both came to live with daughter in [place]    Family Status  Relation Status  . Mother Deceased  . Father Deceased  . Maternal Grandmother Deceased  . Maternal Grandfather Deceased  . Paternal Grandmother Deceased  . Paternal Grandfather Deceased  . Brother   . Neg Hx    Family History  Problem Relation Age of Onset  . Stroke Mother   . Hypertension Mother   . Diabetes Brother   . Heart attack Neg Hx     ROS:  Full 14 point review of systems complete and found to be negative unless listed above.  Physical Exam: Blood pressure 100/62, pulse 99, temperature 97.6 F (36.4 C), temperature source Oral, resp. rate 18, height 5\' 6"  (1.676 m), weight 135 lb 14.4 oz (61.6 kg), SpO2 96 %.  General: Well developed, well nourished, female in no acute distress,  chronically ill appearing.  Head: Eyes PERRLA, No xanthomas.   Normocephalic and atraumatic, oropharynx without edema or exudate.  Lungs: CTAB Heart: HRRR S1 S2, no rub/gallop, Heart irregular rate and rhythm with S1, S2  murmur. pulses are 2+ extrem.   Neck: No carotid bruits. No lymphadenopathy.  No JVD. Abdomen: Bowel sounds present, abdomen soft and non-tender without masses or hernias noted. Msk:  No spine or cva tenderness. No weakness, no joint deformities or effusions. Extremities: No clubbing or cyanosis. No LE edema.  Neuro: Alert and oriented X 3. No focal deficits noted. Psych:  Good affect, responds appropriately Skin: No rashes or lesions noted.  Labs:   Lab Results  Component Value Date   WBC 6.2 05/09/2016   HGB 10.4 (L) 05/09/2016   HCT 30.3 (L) 05/09/2016   MCV 81.7 05/09/2016   PLT 287 05/09/2016    Recent Labs  05/11/16 0545  INR 1.36    Recent Labs Lab 05/11/16 0545  NA 127*  K 3.7  CL 88*  CO2 26  BUN 47*  CREATININE 1.59*  CALCIUM 9.5  GLUCOSE 130*   Magnesium  Date Value Ref Range Status  11/11/2015 1.8 1.7 - 2.4 mg/dL Final   No results for input(s): CKTOTAL, CKMB, TROPONINI in the last 72 hours. No results for input(s): TROPIPOC in the last 72 hours. Pro B Natriuretic peptide (BNP)  Date/Time Value Ref Range Status  01/17/2014 09:16 PM 961.6 (H) 0 - 450 pg/mL Final   NT-Pro BNP  Date/Time Value Ref Range Status  05/08/2016 10:53 AM 1,132 (H) 0 - 738 pg/mL Final    Comment:    The following cut-points have been suggested for the use of proBNP for the diagnostic evaluation of heart failure (HF) in patients with acute dyspnea: Modality                     Age           Optimal Cut                            (years)            Point ------------------------------------------------------ Diagnosis (rule in HF)        <50            450 pg/mL                           50 - 75            900 pg/mL                               >75            1800 pg/mL Exclusion (rule out HF)  Age independent     300 pg/mL    Lab Results  Component Value Date   CHOL 126 04/07/2015   HDL 69 04/07/2015   LDLCALC 36 04/07/2015   TRIG 107 04/07/2015   No results found for: DDIMER Lipase  Date/Time Value Ref Range Status  11/06/2015 04:12 PM 21 11 - 51 U/L Final   TSH  Date/Time Value Ref Range Status  05/08/2016 06:07 PM 7.468 (H) 0.350 - 4.500 uIU/mL Final    Comment:    Performed by a 3rd Generation assay with a functional sensitivity of <=0.01 uIU/mL.  02/14/2016 02:54 PM 7.34 (H) mIU/L Final    Comment:      Reference Range   > or = 20 Years  0.40-4.50   Pregnancy Range First trimester  0.26-2.66 Second trimester 0.55-2.73 Third trimester  0.43-2.91      T3, Free  Date/Time Value Ref Range Status  11/08/2015 11:27 AM 1.3 (L) 2.0 - 4.4 pg/mL Final    Comment:    (NOTE) Performed At: [facility] [address] [place], [place] [id number] Mila Homer MD WU:[id number]    Vitamin B-12  Date/Time Value Ref Range Status  03/22/2014 10:00 AM >2,000 (H) 211 - 911 pg/mL Final    Comment:    Performed at [facility]    Echo: 2D ECHO: 01/27/2016 LV EF: 55% - 60% Study Conclusions - Left ventricle: The cavity size was normal. Wall thickness was normal. Systolic function was normal. The estimated ejection fraction was in the range of 55% to 60%. Wall motion was normal; there were no regional wall motion abnormalities. Doppler parameters are consistent with abnormal left ventricular relaxation (grade 1 diastolic dysfunction). - Aortic valve: There was mild regurgitation. - Mitral valve: There was  mild regurgitation. - Left atrium: The atrium was moderately dilated. Impressions: - Normal LV systolic function; grade 1 diastolic dysfunction; mild AI; mild MR; moderate LAE.  ECG:  Aflutter with var block HR 89  Radiology:  Dg Chest 2 View  Result Date: 05/11/2016 CLINICAL DATA:   Dyspnea, community-acquired pneumonia, CHF, chronic renal insufficiency. EXAM: CHEST  2 VIEW COMPARISON:  Chest x-ray of May 08, 2016 FINDINGS: Since a previous study there has developed confluent alveolar opacity in the left mid and lower lung. The left hemidiaphragm is nearly obscured. The right lung is adequately inflated and clear. The pulmonary vascularity is not clearly engorged. The left heart border is now obscured. There is calcification in the wall of the aortic arch. There is chronic partial compression of the body of T11. IMPRESSION: Interval development of alveolar opacity in the left lung likely in the lingula which is not clearly visible on the lateral view this may reflect pneumonia or atelectasis. There is a new small left pleural effusion. No CHF. Thoracic aortic atherosclerosis. Electronically Signed   By: David  [place] M.D.   On: 05/11/2016 13:06    ASSESSMENT AND PLAN:    Active Problems:   Persistent atrial fibrillation (HCC)   Hypothyroidism   CAP (community acquired pneumonia)   Chronic kidney insufficiency, stage 3 (moderate)   Hyponatremia   Diabetic polyneuropathy associated with type 2 diabetes mellitus (HCC)   Acute on chronic diastolic CHF (congestive heart failure), NYHA class 4 (HCC)   PEG (percutaneous endoscopic gastrostomy) status (HCC)  Shannon [name] is a 83 y.o. female with a history of persistent atrial fibrillation on amiodarone, bradycardia, CKD stage III,hypothyroidism,non-obstructive coronary artery disease, type 2 diabetes, hypertension, chronic diastolic CHF, stroke, cerebrovascular disease, COPD, asthma, gastric outlet obstruction s/p prior J tube, anemia and hyponatremia who was admitted from office on 05/08/16 for productive cough and SOB. IM admitted through ER for possible PNA and IV diuresis. Patient remains SOB and cardiology for further evaluation and management.   Dyspnea and cough: ongoing issues. Possibly related to PNA and CHF. Appears close  to her dry weight (141-->135lbs) . CXR today showed new L pleural effusion but no CHF. Creat is 1.59 currently. Will give her more IV lasix today to try to get her back down to 133 lbs (discharge weight last admission.) NA 125, watch closely with more IV lasix. I think palliative care is best option for Shannon [name], but her daughter is very resistant to this which makes it a very difficult situation. Also , need to t  Acute on chronic diastolic CHF: see above.   Possible PNA: being treated by IM  Persistent atrial flutter on Coumadin: HR low 100s. Continue Cardizem CD 120mg  daily, amiodarone 200mg  daily and Coumadin for CHADSVASC of at least 6. Not sure why she is on amiodarone still, since a rate control strategy is being pursued, wonder if this could be contributing to get lung problems and cough. I will stop this now and we can increase CCB as her BP tolerates.   Gastric outlet obstruction: has chronic J tube   Hx of CVA: continue coumadin  Hyponatremia: Na 127, watch with lasix.   CKD: last creat 1.59. Watch with diuresis.    Signed: Cline Crock, PA-C 05/11/2016 3:46 PM  Pager [phone]  Patient seen and examined. Agree with assessment and plan.  Shannon [name] is a 83 year old African-American female who has complex medical history which includes persistent atrial fibrillation, a history of previous bradycardia, hypothyroidism, nonobstructive CAD,  hypertension, type 2 diabetes mellitus, history of CVA, diastolic heart failure, prior J-tube placement for gastric outlet obstruction, and anemia.  She has recently developed productive cough and shortness of breath and was admitted for possible pneumonia and IV diuresis.  Her BNP is significantly elevated, consistent with acute on chronic heart failure.  She is hyponatremic as result of her CHF which should improve with IV diuresis.  Her breath sounds are diffusely decreased on exam, and I would recommend discontinuance of amiodarone  due to potential lung toxicity in this 83 year old female specially since her atrial fibrillation is persistent with no plans for attempt at reversion to sinus rhythm.  I will initiate bisoprolol, particularly with her history of bronchospasm for AF, rate control.  Her echo from 05/09/2016 shows normal systolic function with an EF of 60-65% without regional wall motion abnormalities.  Her atrium were dilated and she had moderate TR with mild pulmonary hypertension.  She is on Coumadin for anticoagulation with her renal insufficiency and prior stroke.  Her initial ECG had shown atrial flutter at 89 bpm.  Telemetry now reveals atrial flutter at approximately 100 bpm. .  Her TSH is mildly elevated at 7.4, although she has been on amiodarone.   Lennette Bihari, MD, [facility] 05/11/2016 5:00 PM

## 2016-05-11 NOTE — Progress Notes (Signed)
Physical Therapy Treatment Patient Details Name: Shannon [name] MRN: [id number] DOB: 1923-05-27 Today's Date: 05/11/2016    History of Present Illness 83 y.o. female with medical history significant of persistent atrial fibrillation on amiodarone, bradycardia, CKD stage III, hypothyroidism, non-obstructive coronary artery disease, type 2 diabetes, hypertension, chronic diastolic CHF, stroke, cerebrovascular disease, COPD, asthma, gastric outlet obstruction s/p prior J tube, anemia and hyponatremia, presented with shortness of breath and cough for worsening for about 2 weeks.    PT Comments    Pt presented sitting OOB in recliner when PT entered room. Pt making slow progress and able to increase distance ambulated this session. Pt on 2L of O2 with SPO2 maintaining >95% throughout. Pt would continue to benefit from skilled physical therapy services at this time while admitted and after d/c to address her limitations in order to improve her overall safety and independence with functional mobility.    Follow Up Recommendations  Home health PT;Supervision/Assistance - 24 hour     Equipment Recommendations  None recommended by PT    Recommendations for Other Services Speech consult     Precautions / Restrictions Precautions Precautions: Fall Restrictions Weight Bearing Restrictions: No    Mobility  Bed Mobility Overal bed mobility: Needs Assistance Bed Mobility: Supine to Sit     Supine to sit: Supervision     General bed mobility comments: pt sitting OOB in recliner when PT entered room  Transfers Overall transfer level: Needs assistance Equipment used: Rolling walker (2 wheeled) Transfers: Sit to/from Stand Sit to Stand: Min assist;+2 physical assistance;+2 safety/equipment Stand pivot transfers: Min assist       General transfer comment: increased time, min A x2 to power into standing from recliner. pt performed sit-to-stand from recliner  x2.  Ambulation/Gait Ambulation/Gait assistance: Min assist;+2 safety/equipment Ambulation Distance (Feet): 15 Feet Assistive device: Rolling walker (2 wheeled) Gait Pattern/deviations: Step-to pattern;Decreased stride length;Shuffle;Trunk flexed Gait velocity: decreased Gait velocity interpretation: Below normal speed for age/gender General Gait Details: pt required constant min A for stability and movement of RW; close chair follow   Stairs            Wheelchair Mobility    Modified Rankin (Stroke Patients Only)       Balance Overall balance assessment: Needs assistance   Sitting balance-Leahy Scale: Fair     Standing balance support: During functional activity;Bilateral upper extremity supported Standing balance-Leahy Scale: Poor Standing balance comment: pt reliant on bilateral UEs on RW                    Cognition Arousal/Alertness: Awake/alert Behavior During Therapy: Flat affect Overall Cognitive Status: Within Functional Limits for tasks assessed                      Exercises      General Comments        Pertinent Vitals/Pain Pain Assessment: Faces Faces Pain Scale: Hurts even more Pain Location: soreness with standing/ambulation Pain Descriptors / Indicators: Grimacing;Sore Pain Intervention(s): Monitored during session    Home Living                      Prior Function            PT Goals (current goals can now be found in the care plan section) Acute Rehab PT Goals Patient Stated Goal: to get better PT Goal Formulation: With patient Time For Goal Achievement: 05/23/16 Potential to Achieve Goals: Good Progress towards PT  goals: Progressing toward goals    Frequency    Min 3X/week      PT Plan Current plan remains appropriate    Co-evaluation             End of Session Equipment Utilized During Treatment: Gait belt;Oxygen Activity Tolerance: Patient limited by fatigue Patient left: in  chair;with call bell/phone within reach;with chair alarm set     Time: 7253-6644 PT Time Calculation (min) (ACUTE ONLY): 22 min  Charges:  $Gait Training: 8-22 mins                    G Codes:      Alessandra Bevels Gunnison Chahal May 24, 2016, 10:05 AM Deborah Chalk, PT, DPT [phone]

## 2016-05-11 NOTE — Care Management Important Message (Signed)
Important Message  Patient Details  Name: Shannon [name] MRN: [id number] Date of Birth: January 11, 1924   Medicare Important Message Given:  Yes    Parrish Daddario 05/11/2016, 11:47 AM

## 2016-05-11 NOTE — Progress Notes (Signed)
PROGRESS NOTE  Shannon [name] OVF:[id number] DOB: 10/09/1923 DOA: 05/08/2016 PCP: Kirt Boys, DO   LOS: 3 days   Brief Narrative: 83 y.o.femalewith medical history significant of persistent atrial fibrillation on amiodarone, bradycardia, CKD stage III,hypothyroidism,non-obstructive coronary artery disease, type 2 diabetes, hypertension, chronic diastolic CHF, stroke, cerebrovascular disease, COPD, asthma, gastric outlet obstruction s/p prior J tube, anemia and hyponatremia, presented with shortness of breath and cough for worsening for about 2 weeks.  Assessment & Plan: Active Problems:   Persistent atrial fibrillation (HCC)   Hypothyroidism   CAP (community acquired pneumonia)   Chronic kidney insufficiency, stage 3 (moderate)   Hyponatremia   Diabetic polyneuropathy associated with type 2 diabetes mellitus (HCC)   Acute on chronic diastolic CHF (congestive heart failure), NYHA class 4 (HCC)   PEG (percutaneous endoscopic gastrostomy) status (HCC)   Possible community acquired pneumonia - Patient placed on ceftriaxone and azithromycin, continue, improving, today day 4/7 - Respiratory virus panel was negative - Continue supportive care - Remain on IV antibiotics, continues to be dyspneic,  Hypoxic respiratory failure - wean off oxygen as tolerated  Likely acute on chronic diastolic congestive heart failure - Diuresed with IV Lasix, weight is improved from 141 >>135, she appears euvolemic, there is mild elevation in creatinine, discontinued IV Lasix today and place her on by mouth at her home dose - 2-D echo done on 05/09/16 and showed ejection fraction of 60-65% - Patient has no chest pain, troponin not elevated. - Continue Imdur, hydralazine - there is a component of diastolic CHF, however not much significant LE edema. Consulted cardiology as well, they have been following her closely as an outpatient, appreciate input  H/o gastric outlet obstruction - With PEG-J tube,  declogged by IR 2/7  Persistent atrial fibrillation / flutter - Continue amiodarone, diltiazem, Coumadin per pharmacy  Acute on chronic hyponatremia likely in the setting of CHF - Serum sodium has remained stable at 128  Chronic kidney disease is stage III  - Serum creatinine level around baseline. Monitor BMP and electrolytes. Repeat lab in the morning.  Hypothyroidism  - Continue Synthroid - TSH 7.4, will need to be rechecked as an outpatient in ~ 3 weeks  Left leg pain, chronic in nature - Ultrasound negative for DVT  Goals of care discussion - DNR   DVT prophylaxis: Coumadin Code Status: DNR Family Communication: daughter bedside Disposition Plan: d/c 1 day   Consultants:   None   Procedures:   None   Antimicrobials:  Ceftriaxone and azithromycin 2/5 >>  Subjective: - Feels very bit better, however still short of breath this morning.  Objective: Vitals:   05/11/16 0444 05/11/16 0729 05/11/16 1000 05/11/16 1140  BP: (!) 124/57  (!) 110/56 100/62  Pulse: 97  (!) 102 99  Resp: (!) 22   18  Temp: 97.7 F (36.5 C)   97.6 F (36.4 C)  TempSrc: Oral   Oral  SpO2: 99% 97% 98% 99%  Weight:      Height:        Intake/Output Summary (Last 24 hours) at 05/11/16 1317 Last data filed at 05/11/16 0903  Gross per 24 hour  Intake             2160 ml  Output              700 ml  Net             1460 ml   Filed Weights   05/08/16 1842 05/09/16 0453 05/10/16 0349  Weight: 63 kg (139 lb) 63 kg (139 lb) 61.6 kg (135 lb 14.4 oz)    Examination: Constitutional: NAD Vitals:   05/11/16 0444 05/11/16 0729 05/11/16 1000 05/11/16 1140  BP: (!) 124/57  (!) 110/56 100/62  Pulse: 97  (!) 102 99  Resp: (!) 22   18  Temp: 97.7 F (36.5 C)   97.6 F (36.4 C)  TempSrc: Oral   Oral  SpO2: 99% 97% 98% 99%  Weight:      Height:       Eyes: PERRL, eyes with crusty discharge ENMT: Mucous membranes are moist.  Neck: normal, supple, no masses Respiratory: clear to  auscultation bilaterally, no wheezing, no crackles. Normal respiratory effort. No accessory muscle use.  Cardiovascular: Regular rate and rhythm, no murmurs / rubs / gallops. Trace LE edema. 2+ pedal pulses.  Abdomen: no tenderness. Bowel sounds positive.  Musculoskeletal: no clubbing / cyanosis. Neurologic: non focal    Data Reviewed: I have personally reviewed following labs and imaging studies  CBC:  Recent Labs Lab 05/08/16 1241 05/09/16 0953  WBC 6.4 6.2  HGB 10.2* 10.4*  HCT 30.2* 30.3*  MCV 81.0 81.7  PLT 301 287   Basic Metabolic Panel:  Recent Labs Lab 05/08/16 1053 05/08/16 1241 05/09/16 0953 05/10/16 0515 05/11/16 0545  NA 121* 123* 128* 128* 127*  K 4.6 4.3 3.8 4.2 3.7  CL 81* 86* 87* 88* 88*  CO2 26 26 28 25 26   GLUCOSE 121* 127* 170* 140* 130*  BUN 53* 56* 51* 52* 47*  CREATININE 1.59* 1.63* 1.44* 1.55* 1.59*  CALCIUM 9.6 9.6 9.5 9.5 9.5   Coagulation Profile:  Recent Labs Lab 05/08/16 1642 05/09/16 0953 05/10/16 0515 05/11/16 0545  INR 1.40 1.31 1.41 1.36   BNP (last 3 results)  Recent Labs  05/08/16 1053  PROBNP 1,132*   CBG:  Recent Labs Lab 05/10/16 1125 05/10/16 1647 05/10/16 2143 05/11/16 0731 05/11/16 1136  GLUCAP 174* 220* 112* 130* 182*   Thyroid Function Tests:  Recent Labs  05/08/16 1807  TSH 7.468*   Urine analysis:    Component Value Date/Time   COLORURINE YELLOW 05/10/2016 1539   APPEARANCEUR CLEAR 05/10/2016 1539   LABSPEC 1.009 05/10/2016 1539   PHURINE 7.0 05/10/2016 1539   GLUCOSEU NEGATIVE 05/10/2016 1539   HGBUR NEGATIVE 05/10/2016 1539   BILIRUBINUR NEGATIVE 05/10/2016 1539   KETONESUR NEGATIVE 05/10/2016 1539   PROTEINUR NEGATIVE 05/10/2016 1539   UROBILINOGEN 0.2 01/27/2015 2308   NITRITE NEGATIVE 05/10/2016 1539   LEUKOCYTESUR NEGATIVE 05/10/2016 1539   Sepsis Labs: Invalid input(s): PROCALCITONIN, LACTICIDVEN  Recent Results (from the past 240 hour(s))  Blood Culture (routine x 2)      Status: None (Preliminary result)   Collection Time: 05/08/16  4:30 PM  Result Value Ref Range Status   Specimen Description BLOOD LEFT FOREARM  Final   Special Requests IN PEDIATRIC BOTTLE 1CC  Final   Culture NO GROWTH 3 DAYS  Final   Report Status PENDING  Incomplete  Blood Culture (routine x 2)     Status: None (Preliminary result)   Collection Time: 05/08/16  4:35 PM  Result Value Ref Range Status   Specimen Description BLOOD RIGHT HAND  Final   Special Requests BOTTLES DRAWN AEROBIC AND ANAEROBIC 5CC  Final   Culture NO GROWTH 3 DAYS  Final   Report Status PENDING  Incomplete  MRSA PCR Screening     Status: None   Collection Time: 05/08/16  7:32 PM  Result Value Ref Range Status   MRSA by PCR NEGATIVE NEGATIVE Final    Comment:        The GeneXpert MRSA Assay (FDA approved for NASAL specimens only), is one component of a comprehensive MRSA colonization surveillance program. It is not intended to diagnose MRSA infection nor to guide or monitor treatment for MRSA infections.   Respiratory Panel by PCR     Status: None   Collection Time: 05/08/16  7:35 PM  Result Value Ref Range Status   Adenovirus NOT DETECTED NOT DETECTED Final   Coronavirus 229E NOT DETECTED NOT DETECTED Final   Coronavirus HKU1 NOT DETECTED NOT DETECTED Final   Coronavirus NL63 NOT DETECTED NOT DETECTED Final   Coronavirus OC43 NOT DETECTED NOT DETECTED Final   Metapneumovirus NOT DETECTED NOT DETECTED Final   Rhinovirus / Enterovirus NOT DETECTED NOT DETECTED Final   Influenza A NOT DETECTED NOT DETECTED Final   Influenza B NOT DETECTED NOT DETECTED Final   Parainfluenza Virus 1 NOT DETECTED NOT DETECTED Final   Parainfluenza Virus 2 NOT DETECTED NOT DETECTED Final   Parainfluenza Virus 3 NOT DETECTED NOT DETECTED Final   Parainfluenza Virus 4 NOT DETECTED NOT DETECTED Final   Respiratory Syncytial Virus NOT DETECTED NOT DETECTED Final   Bordetella pertussis NOT DETECTED NOT DETECTED Final    Chlamydophila pneumoniae NOT DETECTED NOT DETECTED Final   Mycoplasma pneumoniae NOT DETECTED NOT DETECTED Final  Culture, sputum-assessment     Status: None   Collection Time: 05/09/16  4:01 AM  Result Value Ref Range Status   Specimen Description EXPECTORATED SPUTUM  Final   Special Requests NONE  Final   Sputum evaluation   Final    Sputum specimen not acceptable for testing.  Please recollect.   Gram Stain Report Called to,Read Back By and Verified With: R CULBREN,RN @0710  05/09/16 MKELLY,MLT   Report Status 05/09/2016 FINAL  Final      Radiology Studies: Dg Chest 2 View  Result Date: 05/11/2016 CLINICAL DATA:  Dyspnea, community-acquired pneumonia, CHF, chronic renal insufficiency. EXAM: CHEST  2 VIEW COMPARISON:  Chest x-ray of May 08, 2016 FINDINGS: Since a previous study there has developed confluent alveolar opacity in the left mid and lower lung. The left hemidiaphragm is nearly obscured. The right lung is adequately inflated and clear. The pulmonary vascularity is not clearly engorged. The left heart border is now obscured. There is calcification in the wall of the aortic arch. There is chronic partial compression of the body of T11. IMPRESSION: Interval development of alveolar opacity in the left lung likely in the lingula which is not clearly visible on the lateral view this may reflect pneumonia or atelectasis. There is a new small left pleural effusion. No CHF. Thoracic aortic atherosclerosis. Electronically Signed   By: David  [place] M.D.   On: 05/11/2016 13:06     Scheduled Meds: . amiodarone  200 mg Oral Daily  . atorvastatin  10 mg Oral Daily  . azithromycin  500 mg Intravenous Q24H  . bacitracin-polymyxin b   Both Eyes BID  . calcium-vitamin D  1 tablet Oral Q breakfast  . cefTRIAXone (ROCEPHIN)  IV  1 g Intravenous Q24H  . chlorhexidine  15 mL Mouth/Throat BID  . cycloSPORINE  1 drop Both Eyes BID  . diltiazem  120 mg Oral Daily  . feeding supplement (GLUCERNA  SHAKE)  237 mL Oral q12n4p  . feeding supplement (PRO-STAT SUGAR FREE 64)  30 mL Per Tube Daily  . fluticasone  2 spray Each Nare Daily  . furosemide  40 mg Oral BID  . gabapentin  300 mg Oral QHS  . hydrALAZINE  50 mg Oral TID  . insulin aspart  0-9 Units Subcutaneous TID WC  . ipratropium-albuterol  3 mL Nebulization TID  . isosorbide mononitrate  30 mg Oral Daily  . latanoprost  1 drop Both Eyes QHS  . levothyroxine  137 mcg Oral QAC breakfast  . loratadine  10 mg Oral Daily  . multivitamin with minerals  1 tablet Oral Daily  . pantoprazole  40 mg Oral BID  . polyvinyl alcohol  1 drop Both Eyes QID  . sertraline  100 mg Oral Daily  . warfarin  7.5 mg Oral ONCE-1800  . Warfarin - Pharmacist Dosing Inpatient   Does not apply q1800   Continuous Infusions: . feeding supplement (OSMOLITE 1.5 CAL) Stopped (05/09/16 2000)    Time spent: 45 minutes, > 50% bedside discussing with daughter bedside   Pamella Pert, MD, PhD [facility] Pager [phone]  If 7PM-7AM, please contact night-coverage [email or web address] Password [email or web address] 05/11/2016, 1:17 PM

## 2016-05-11 NOTE — Progress Notes (Signed)
Occupational Therapy Treatment Patient Details Name: Shannon [name] MRN: [id number] DOB: March 25, 1924 Today's Date: 05/11/2016    History of present illness 83 y.o. female with medical history significant of persistent atrial fibrillation on amiodarone, bradycardia, CKD stage III, hypothyroidism, non-obstructive coronary artery disease, type 2 diabetes, hypertension, chronic diastolic CHF, stroke, cerebrovascular disease, COPD, asthma, gastric outlet obstruction s/p prior J tube, anemia and hyponatremia, presented with shortness of breath and cough for worsening for about 2 weeks.   OT comments  Pt requiring one person assist to pivot to chair and set up for breakfast. Pt with coughing with thick mucous once seated at EOB. States she feels "miserable."   Follow Up Recommendations  No OT follow up;Supervision/Assistance - 24 hour    Equipment Recommendations  None recommended by OT    Recommendations for Other Services      Precautions / Restrictions Precautions Precautions: Fall Restrictions Weight Bearing Restrictions: No       Mobility Bed Mobility Overal bed mobility: Needs Assistance Bed Mobility: Supine to Sit     Supine to sit: Supervision     General bed mobility comments: increased time, but no physical assist  Transfers Overall transfer level: Needs assistance   Transfers: Sit to/from Stand;Stand Pivot Transfers Sit to Stand: Mod assist Stand pivot transfers: Min assist       General transfer comment: assist to rise from bed, steadyin assist as she pivoted to chair    Balance Overall balance assessment: Needs assistance   Sitting balance-Leahy Scale: Fair       Standing balance-Leahy Scale: Poor Standing balance comment: posterior lean                   ADL Overall ADL's : Needs assistance/impaired Eating/Feeding: Set up;Sitting Eating/Feeding Details (indicate cue type and reason): thickened pt's clear liquid breakfast, place in cup, tremor  interfering with self feeding with spoon Grooming: Wash/dry hands;Wash/dry face;Sitting;Set up                                 General ADL Comments: Transferred to chair for breakfast and to take medication.      Vision                     Perception     Praxis      Cognition   Behavior During Therapy: Flat affect Overall Cognitive Status: Within Functional Limits for tasks assessed                       Extremity/Trunk Assessment               Exercises     Shoulder Instructions       General Comments      Pertinent Vitals/ Pain       Pain Assessment: Faces Faces Pain Scale: Hurts even more Pain Location: L side of chest with coughing Pain Descriptors / Indicators: Grimacing Pain Intervention(s): Monitored during session (RN in room and aware)  Home Living                                          Prior Functioning/Environment              Frequency  Min 2X/week        Progress Toward Goals  OT Goals(current goals can now be found in the care plan section)  Progress towards OT goals: Progressing toward goals  Acute Rehab OT Goals Patient Stated Goal: to get better Time For Goal Achievement: 05/23/16 Potential to Achieve Goals: Fair  Plan Discharge plan remains appropriate    Co-evaluation                 End of Session Equipment Utilized During Treatment: Gait belt;Oxygen   Activity Tolerance Patient limited by fatigue   Patient Left in chair;with call bell/phone within reach;with chair alarm set   Nurse Communication  (aware of pain)        Time: 9371-6967 OT Time Calculation (min): 25 min  Charges: OT General Charges $OT Visit: 1 Procedure OT Treatments $Self Care/Home Management : 23-37 mins  Evern Bio 05/11/2016, 8:48 AM [phone]

## 2016-05-11 NOTE — Consult Note (Signed)
[name] CM Inpatient Consult   05/11/2016  Shannon [name] 05/11/1923 [id number]  Patient was assessed for [name] for community services and well known to [name]. Patient has been evaluated for multiple hospitalizations in the past 6 months.   Met with patient  in room sitting up in chair, alert and oriented but very short of breath, able to state her birthday and events prior to admission as she had been sent to the hospital from her MD office visit.  Patient very short of breath and she states her daughter will visit later today.  Chart review reveals per documented notes the patient is a 83 y.o. female with medical history significant of persistent atrial fibrillation on amiodarone, bradycardia, CKD stage III, hypothyroidism, non-obstructive coronary artery disease, type 2 diabetes, hypertension, chronic diastolic CHF, stroke, cerebrovascular disease, COPD, asthma, gastric outlet obstruction s/p prior J tube, anemia and hyponatremia, presented with shortness of breath and cough for worsening for about 2 weeks    Will follow for progression and needs.  Of note, [name] services does not replace or interfere with any services that are arranged by inpatient case management or social work. For additional questions or referrals please contact:    , RN BSN CCM [facility] Liaison  [phone] business mobile phone Toll free office [phone]     [name] CM Inpatient Consult   05/11/2016  Shannon [name] 05/11/1923 [id number]  Patient was assessed for [name] for community services and well known to [name]. Patient has been evaluated for multiple hospitalizations in the past 6 months.   Met with patient  in room sitting up in chair, alert and oriented but very short of breath, able to state her birthday and events prior to admission as she had been sent to the hospital from her MD office visit.  Patient very short of breath and she states her daughter will visit later today.  Chart review reveals per documented notes the patient is a 83 y.o. female with medical history significant of persistent atrial fibrillation on amiodarone, bradycardia, CKD stage III, hypothyroidism, non-obstructive coronary artery disease, type 2 diabetes, hypertension, chronic diastolic CHF, stroke, cerebrovascular disease, COPD, asthma, gastric outlet obstruction s/p prior J tube, anemia and hyponatremia, presented with shortness of breath and cough for worsening for about 2 weeks    Will follow for progression and needs.  Of note, [name] services does not replace or interfere with any services that are arranged by inpatient case management or social work. For additional questions or referrals please contact:    , RN BSN CCM [facility] Liaison  [phone] business mobile phone Toll free office [phone]   

## 2016-05-11 NOTE — Progress Notes (Signed)
Patient refused to start tube feeds now, she wants it to be started tonight.

## 2016-05-12 DIAGNOSIS — J189 Pneumonia, unspecified organism: Secondary | ICD-10-CM

## 2016-05-12 DIAGNOSIS — R0600 Dyspnea, unspecified: Secondary | ICD-10-CM

## 2016-05-12 DIAGNOSIS — I4892 Unspecified atrial flutter: Secondary | ICD-10-CM

## 2016-05-12 LAB — CBC
HCT: 28.6 % — ABNORMAL LOW (ref 36.0–46.0)
Hemoglobin: 9.7 g/dL — ABNORMAL LOW (ref 12.0–15.0)
MCH: 28 pg (ref 26.0–34.0)
MCHC: 33.9 g/dL (ref 30.0–36.0)
MCV: 82.7 fL (ref 78.0–100.0)
Platelets: 284 10*3/uL (ref 150–400)
RBC: 3.46 MIL/uL — ABNORMAL LOW (ref 3.87–5.11)
RDW: 19 % — ABNORMAL HIGH (ref 11.5–15.5)
WBC: 5.8 10*3/uL (ref 4.0–10.5)

## 2016-05-12 LAB — PROTIME-INR
INR: 1.47
Prothrombin Time: 17.9 seconds — ABNORMAL HIGH (ref 11.4–15.2)

## 2016-05-12 LAB — GLUCOSE, CAPILLARY
Glucose-Capillary: 205 mg/dL — ABNORMAL HIGH (ref 65–99)
Glucose-Capillary: 137 mg/dL — ABNORMAL HIGH (ref 65–99)
Glucose-Capillary: 183 mg/dL — ABNORMAL HIGH (ref 65–99)
Glucose-Capillary: 139 mg/dL — ABNORMAL HIGH (ref 65–99)

## 2016-05-12 LAB — BASIC METABOLIC PANEL
Anion gap: 13 (ref 5–15)
BUN: 56 mg/dL — ABNORMAL HIGH (ref 6–20)
CO2: 25 mmol/L (ref 22–32)
Calcium: 9 mg/dL (ref 8.9–10.3)
Chloride: 89 mmol/L — ABNORMAL LOW (ref 101–111)
Creatinine, Ser: 1.82 mg/dL — ABNORMAL HIGH (ref 0.44–1.00)
GFR calc Af Amer: 27 mL/min — ABNORMAL LOW (ref >60)
GFR calc non Af Amer: 23 mL/min — ABNORMAL LOW (ref >60)
Glucose, Bld: 209 mg/dL — ABNORMAL HIGH (ref 65–99)
Potassium: 3.6 mmol/L (ref 3.5–5.1)
Sodium: 127 mmol/L — ABNORMAL LOW (ref 135–145)

## 2016-05-12 MED ORDER — WARFARIN SODIUM 7.5 MG PO TABS
7.5000 mg | ORAL_TABLET | Freq: Once | ORAL | Status: AC
  Administered 2016-05-12: 7.5 mg via ORAL
  Filled 2016-05-12: qty 1

## 2016-05-12 MED ORDER — FUROSEMIDE 40 MG PO TABS
40.0000 mg | ORAL_TABLET | Freq: Every day | ORAL | Status: DC
  Administered 2016-05-13 – 2016-05-16 (×4): 40 mg via ORAL
  Filled 2016-05-12 (×4): qty 1

## 2016-05-12 NOTE — Progress Notes (Signed)
Physical Therapy Treatment Patient Details Name: Shannon [name] MRN: [id number] DOB: July 02, 1923 Today's Date: 05/12/2016    History of Present Illness 83 y.o. female with medical history significant of persistent atrial fibrillation on amiodarone, bradycardia, CKD stage III, hypothyroidism, non-obstructive coronary artery disease, type 2 diabetes, hypertension, chronic diastolic CHF, stroke, cerebrovascular disease, COPD, asthma, gastric outlet obstruction s/p prior J tube, anemia and hyponatremia, presented with shortness of breath and cough for worsening for about 2 weeks.    PT Comments    Pt making great progress with mobility. PT will continue to follow acutely and continuing to recommend pt d/c home with HHPT services.   Follow Up Recommendations  Home health PT;Supervision/Assistance - 24 hour     Equipment Recommendations  None recommended by PT    Recommendations for Other Services Speech consult     Precautions / Restrictions Precautions Precautions: Fall Restrictions Weight Bearing Restrictions: No    Mobility  Bed Mobility Overal bed mobility: Needs Assistance Bed Mobility: Sit to Supine       Sit to supine: Min guard   General bed mobility comments: increased time, min guard for safety  Transfers Overall transfer level: Needs assistance Equipment used: Rolling walker (2 wheeled) Transfers: Sit to/from Stand Sit to Stand: Min guard         General transfer comment: increased time, min guard for safety  Ambulation/Gait Ambulation/Gait assistance: Min guard Ambulation Distance (Feet): 20 Feet Assistive device: Rolling walker (2 wheeled) Gait Pattern/deviations: Step-to pattern;Decreased stride length;Shuffle;Trunk flexed Gait velocity: decreased Gait velocity interpretation: Below normal speed for age/gender General Gait Details: pt demonstrated safety with use of RW, mild instability but no LOB or need for physical assist, min guard for  safety   Stairs            Wheelchair Mobility    Modified Rankin (Stroke Patients Only)       Balance Overall balance assessment: Needs assistance Sitting-balance support: Feet supported Sitting balance-Leahy Scale: Fair     Standing balance support: During functional activity;Bilateral upper extremity supported Standing balance-Leahy Scale: Poor Standing balance comment: pt reliant on bilateral UEs on external supports                    Cognition Arousal/Alertness: Awake/alert Behavior During Therapy: WFL for tasks assessed/performed Overall Cognitive Status: Within Functional Limits for tasks assessed                      Exercises      General Comments        Pertinent Vitals/Pain Pain Assessment: No/denies pain    Home Living                      Prior Function            PT Goals (current goals can now be found in the care plan section) Acute Rehab PT Goals Patient Stated Goal: to get better PT Goal Formulation: With patient Time For Goal Achievement: 05/23/16 Potential to Achieve Goals: Good Progress towards PT goals: Progressing toward goals    Frequency    Min 3X/week      PT Plan Current plan remains appropriate    Co-evaluation             End of Session Equipment Utilized During Treatment: Gait belt;Oxygen Activity Tolerance: Patient tolerated treatment well Patient left: in bed;with call bell/phone within reach;with bed alarm set     Time: 4098-1191 PT  Time Calculation (min) (ACUTE ONLY): 20 min  Charges:  $Gait Training: 8-22 mins                    G CodesAlessandra Bevels Maccoy Haubner 2016-05-18, 5:59 PM Deborah Chalk, PT, DPT [phone]

## 2016-05-12 NOTE — Progress Notes (Signed)
MD did u want to order any free water with the tube feeding, pt does not have any ordered thanks, Arvella Nigh RN.

## 2016-05-12 NOTE — Progress Notes (Signed)
Physical Therapy Treatment Patient Details Name: Shannon [name] MRN: [id number] DOB: January 11, 1924 Today's Date: 05/12/2016    History of Present Illness 83 y.o. female with medical history significant of persistent atrial fibrillation on amiodarone, bradycardia, CKD stage III, hypothyroidism, non-obstructive coronary artery disease, type 2 diabetes, hypertension, chronic diastolic CHF, stroke, cerebrovascular disease, COPD, asthma, gastric outlet obstruction s/p prior J tube, anemia and hyponatremia, presented with shortness of breath and cough for worsening for about 2 weeks.    PT Comments    Pt presented supine in bed with HOB elevated, awake and requesting assistance to use the bathroom. Pt required min A with transfers to and from [facility]. Pt would continue to benefit from skilled physical therapy services at this time while admitted and after d/c to address her limitations in order to improve her overall safety and independence with functional mobility.    Follow Up Recommendations  Home health PT;Supervision/Assistance - 24 hour     Equipment Recommendations  None recommended by PT    Recommendations for Other Services Speech consult     Precautions / Restrictions Precautions Precautions: Fall Restrictions Weight Bearing Restrictions: No    Mobility  Bed Mobility Overal bed mobility: Needs Assistance Bed Mobility: Supine to Sit     Supine to sit: Supervision     General bed mobility comments: increased time, supervision for safety  Transfers Overall transfer level: Needs assistance Equipment used: None Transfers: Sit to/from Stand;Stand Pivot Transfers Sit to Stand: Min assist Stand pivot transfers: Min assist       General transfer comment: increased time, min A for stability with sit-to-stand from bed and stand-pivot transfers from bed to [facility] and then from [facility] to recliner.  Ambulation/Gait                 Stairs            Wheelchair Mobility     Modified Rankin (Stroke Patients Only)       Balance Overall balance assessment: Needs assistance   Sitting balance-Leahy Scale: Fair Sitting balance - Comments: able to sit EOB with out physical assist   Standing balance support: During functional activity;Bilateral upper extremity supported Standing balance-Leahy Scale: Poor Standing balance comment: pt reliant on bilateral UEs on external supports                    Cognition Arousal/Alertness: Awake/alert Behavior During Therapy: Flat affect Overall Cognitive Status: Within Functional Limits for tasks assessed                      Exercises      General Comments        Pertinent Vitals/Pain Pain Assessment: Faces Faces Pain Scale: No hurt    Home Living                      Prior Function            PT Goals (current goals can now be found in the care plan section) Acute Rehab PT Goals Patient Stated Goal: to get better PT Goal Formulation: With patient Time For Goal Achievement: 05/23/16 Potential to Achieve Goals: Good Progress towards PT goals: Progressing toward goals    Frequency    Min 3X/week      PT Plan Current plan remains appropriate    Co-evaluation             End of Session Equipment Utilized During Treatment: Gait belt Activity  Tolerance: Patient limited by fatigue Patient left: in chair;with call bell/phone within reach;with family/visitor present;with chair alarm set     Time: 1019-1040 PT Time Calculation (min) (ACUTE ONLY): 21 min  Charges:  $Therapeutic Activity: 8-22 mins                    G CodesAlessandra Bevels Joli Koob 06/08/2016, 10:50 AM Deborah Chalk, PT, DPT [phone]

## 2016-05-12 NOTE — Progress Notes (Signed)
[place] for warfarin Indication: atrial fibrillation  Allergies  Allergen Reactions  . Penicillins Itching and Rash    Has patient had a PCN reaction causing immediate rash, facial/tongue/throat swelling, SOB or lightheadedness with hypotension: Yes Has patient had a PCN reaction causing severe rash involving mucus membranes or skin necrosis: Unk Has patient had a PCN reaction that required hospitalization: Unk Has patient had a PCN reaction occurring within the last 10 years: Unk If all of the above answers are "NO", then may proceed with Cephalosporin use.     Patient Measurements: Height: 5\' 6"  (167.6 cm) Weight: 136 lb 9.6 oz (62 kg) (scale a) IBW/kg (Calculated) : 59.3 Heparin Dosing Weight: n/a  Vital Signs: Temp: 98.3 F (36.8 C) (02/09 0458) Temp Source: Oral (02/09 0458) BP: 135/50 (02/09 0458) Pulse Rate: 75 (02/09 0458)  Labs:  Recent Labs  05/09/16 0953 05/10/16 0515 05/11/16 0545 05/12/16 0350  HGB 10.4*  --   --  9.7*  HCT 30.3*  --   --  28.6*  PLT 287  --   --  284  LABPROT 16.4* 17.3* 16.9* 17.9*  INR 1.31 1.41 1.36 1.47  CREATININE 1.44* 1.55* 1.59* 1.82*    Estimated Creatinine Clearance: 19.2 mL/min (by C-G formula based on SCr of 1.82 mg/dL (H)).   Assessment: 86 yoF on warfarin PTA for atrial fibrillation to resume per pharmacy on admit.  INR = 1.47 (no doses on 2/5 / 2/6)  PTA warfarin dose = 3.75mg  daily except 2.5mg  on Mondays  Goal of Therapy:  INR 2-3 Monitor platelets by anticoagulation protocol: Yes   Plan:  -Warfarin 7.5 mg po x 1 tonight -Monitor INR, S/Sx bleeding daily  Thank you Anette Guarneri, PharmD [phone]

## 2016-05-12 NOTE — Progress Notes (Signed)
Progress Note  Patient Name: Shannon [name] Date of Encounter: 05/12/2016  Primary Cardiologist: Dr. Meda Coffee  Subjective   No chest pain  Inpatient Medications    Scheduled Meds: . atorvastatin  10 mg Oral Daily  . azithromycin  500 mg Intravenous Q24H  . bacitracin-polymyxin b   Both Eyes BID  . bisoprolol  5 mg Oral Daily  . calcium-vitamin D  1 tablet Oral Q breakfast  . cefTRIAXone (ROCEPHIN)  IV  1 g Intravenous Q24H  . chlorhexidine  15 mL Mouth/Throat BID  . cycloSPORINE  1 drop Both Eyes BID  . diltiazem  120 mg Oral Daily  . feeding supplement (GLUCERNA SHAKE)  237 mL Oral q12n4p  . feeding supplement (PRO-STAT SUGAR FREE 64)  30 mL Per Tube Daily  . fluticasone  2 spray Each Nare Daily  . furosemide  40 mg Oral BID  . gabapentin  300 mg Oral QHS  . hydrALAZINE  50 mg Oral TID  . insulin aspart  0-9 Units Subcutaneous TID WC  . ipratropium-albuterol  3 mL Nebulization TID  . isosorbide mononitrate  30 mg Oral Daily  . latanoprost  1 drop Both Eyes QHS  . levothyroxine  137 mcg Oral QAC breakfast  . loratadine  10 mg Oral Daily  . multivitamin with minerals  1 tablet Oral Daily  . pantoprazole  40 mg Oral BID  . polyvinyl alcohol  1 drop Both Eyes QID  . sertraline  100 mg Oral Daily  . warfarin  7.5 mg Oral ONCE-1800  . Warfarin - Pharmacist Dosing Inpatient   Does not apply q1800   Continuous Infusions: . feeding supplement (OSMOLITE 1.5 CAL) Stopped (05/09/16 2000)   PRN Meds: acetaminophen, albuterol, benzonatate, bisacodyl, HYDROcodone-acetaminophen, RESOURCE THICKENUP CLEAR, traMADol   Vital Signs    Vitals:   05/11/16 2219 05/12/16 0458 05/12/16 0728 05/12/16 1138  BP:  (!) 135/50  (!) 125/56  Pulse:  75  79  Resp:  20    Temp:  98.3 F (36.8 C)    TempSrc:  Oral    SpO2: 92% 95% 95% 94%  Weight:  136 lb 9.6 oz (62 kg)    Height:        Intake/Output Summary (Last 24 hours) at 05/12/16 1415 Last data filed at 05/12/16 0943  Gross per 24  hour  Intake             2430 ml  Output              902 ml  Net             1528 ml    I/O since admission: +893  Filed Weights   05/09/16 0453 05/10/16 0349 05/12/16 0458  Weight: 139 lb (63 kg) 135 lb 14.4 oz (61.6 kg) 136 lb 9.6 oz (62 kg)    Telemetry    AFlutter with variable block in the 80s- Personally Reviewed  ECG    ECG (independently read by me): A Flutter at 89; NSSTT changes  Physical Exam   BP (!) 125/56 (BP Location: Right Arm)   Pulse 79   Temp 98.3 F (36.8 C) (Oral)   Resp 20   Ht 5\' 6"  (1.676 m)   Wt 136 lb 9.6 oz (62 kg) Comment: scale a  SpO2 94%   BMI 22.05 kg/m  General: Alert, oriented, no distress.  Skin: normal turgor, no rashes, warm and dry HEENT: Normocephalic, atraumatic. Pupils equal round and reactive to light;  sclera anicteric; extraocular muscles intact; Nose without nasal septal hypertrophy Mouth/Parynx benign; Mallinpatti scale 3 Neck: No JVD, no carotid bruits; normal carotid upstroke Lungs: diffuse rhonchi Chest wall: without tenderness to palpitation Heart: irregular in the 80s s1 s2 normal, 1/6 systolic murmur, no diastolic murmur, no rubs, gallops, thrills, or heaves Abdomen: soft, nontender; no hepatosplenomehaly, BS+; abdominal aorta nontender and not dilated by palpation. Back: no CVA tenderness Pulses 2+ Extremities: no clubbing cyanosis or edema, Homan's sign negative  Neurologic: grossly nonfocal; Cranial nerves grossly wnl Psychologic: Normal mood   Labs    Chemistry Recent Labs Lab 05/10/16 0515 05/11/16 0545 05/12/16 0350  NA 128* 127* 127*  K 4.2 3.7 3.6  CL 88* 88* 89*  CO2 25 26 25   GLUCOSE 140* 130* 209*  BUN 52* 47* 56*  CREATININE 1.55* 1.59* 1.82*  CALCIUM 9.5 9.5 9.0  GFRNONAA 28* 27* 23*  GFRAA 33* 32* 27*  ANIONGAP 15 13 13      Hematology Recent Labs Lab 05/08/16 1241 05/09/16 0953 05/12/16 0350  WBC 6.4 6.2 5.8  RBC 3.73* 3.71* 3.46*  HGB 10.2* 10.4* 9.7*  HCT 30.2* 30.3*  28.6*  MCV 81.0 81.7 82.7  MCH 27.3 28.0 28.0  MCHC 33.8 34.3 33.9  RDW 18.2* 18.7* 19.0*  PLT 301 287 284    Cardiac EnzymesNo results for input(s): TROPONINI in the last 168 hours.  Recent Labs Lab 05/08/16 1254  TROPIPOC 0.02     BNP Recent Labs Lab 05/08/16 1053 05/08/16 1635  BNP  --  80.9  PROBNP 1,132*  --      DDimer No results for input(s): DDIMER in the last 168 hours.   Lipid Panel     Component Value Date/Time   CHOL 126 04/07/2015 1015   TRIG 107 04/07/2015 1015   HDL 69 04/07/2015 1015   CHOLHDL 1.8 04/07/2015 1015   CHOLHDL 1.9 12/24/2013 0456   VLDL 26 12/24/2013 0456   LDLCALC 36 04/07/2015 1015    Radiology    Dg Chest 2 View  Result Date: 05/11/2016 CLINICAL DATA:  Dyspnea, community-acquired pneumonia, CHF, chronic renal insufficiency. EXAM: CHEST  2 VIEW COMPARISON:  Chest x-ray of May 08, 2016 FINDINGS: Since a previous study there has developed confluent alveolar opacity in the left mid and lower lung. The left hemidiaphragm is nearly obscured. The right lung is adequately inflated and clear. The pulmonary vascularity is not clearly engorged. The left heart border is now obscured. There is calcification in the wall of the aortic arch. There is chronic partial compression of the body of T11. IMPRESSION: Interval development of alveolar opacity in the left lung likely in the lingula which is not clearly visible on the lateral view this may reflect pneumonia or atelectasis. There is a new small left pleural effusion. No CHF. Thoracic aortic atherosclerosis. Electronically Signed   By: David  Martinique M.D.   On: 05/11/2016 13:06    Cardiac Studies   Echo: 2D ECHO: 01/27/2016 LV EF: 55% - 60% Study Conclusions - Left ventricle: The cavity size was normal. Wall thickness was normal. Systolic function was normal. The estimated ejection fraction was in the range of 55% to 60%. Wall motion was normal; there were no regional wall motion  abnormalities. Doppler parameters are consistent with abnormal left ventricular relaxation (grade 1 diastolic dysfunction). - Aortic valve: There was mild regurgitation. - Mitral valve: There was mild regurgitation. - Left atrium: The atrium was moderately dilated. Impressions: - Normal LV systolic function; grade 1  diastolic dysfunction; mild AI; mild MR; moderate LAE.  Patient Profile      Shannon [name] is a 83 y.o. female with a history of persistent atrial fibrillation on amiodarone, bradycardia, CKD stage III,hypothyroidism,non-obstructive coronary artery disease, type 2 diabetes, hypertension, chronic diastolic CHF, stroke, cerebrovascular disease, COPD, asthma, gastric outlet obstruction s/p prior J tube, anemia and hyponatremia who was admitted from office on 05/08/16 for productive cough and SOB. IM admitted through ER for possible PNA and IV diuresis. Patient remains SOB and cardiology consulted for further evaluation and management.   Assessment & Plan    1. Persistent AFlutter with variable block: now off amiodarone;  Rate controlled now on bisoprolol.   2. Acute on chronic diastolic CHF:  BNP on 2/5 80.9  3. Hyponatremia  Na 127 today  4. Possible pneumonia  5. CKD Cr increased to 1.82 today, will need to decrease lasix.   6. Anemia  H/H 9.7/28.6 today  7. Anticoagulation on warfarin; subtherapeutic at 1.47  Signed, Troy Sine, MD, [facility] 05/12/2016, 2:15 PM

## 2016-05-12 NOTE — Progress Notes (Signed)
Inpatient Diabetes Program Recommendations  AACE/ADA: New Consensus Statement on Inpatient Glycemic Control (2015)  Target Ranges:  Prepandial:   less than 140 mg/dL      Peak postprandial:   less than 180 mg/dL (1-2 hours)      Critically ill patients:  140 - 180 mg/dL   Lab Results  Component Value Date   GLUCAP 205 (H) 05/12/2016   HGBA1C 7.4 (H) 03/11/2016     Current orders for Inpatient glycemic control:    Sensitive correction scale Novolog 0-9 units [facility]  Inpatient Diabetes Program Recommendations:     Please consider adding sensitive correction at bedtime of Novolog 0-5 units QHS.  Thank you,  Windy Carina, RN, MSN Diabetes Coordinator Inpatient Diabetes Program [phone] (Team Pager)

## 2016-05-12 NOTE — Progress Notes (Addendum)
PROGRESS NOTE  Shannon [name] MVH:[id number] DOB: [birth year] DOA: 05/08/2016 PCP: Kirt Boys, DO   LOS: 4 days   Brief Narrative: 83 y.o.femalewith medical history significant of persistent atrial fibrillation on amiodarone, bradycardia, CKD stage III,hypothyroidism,non-obstructive coronary artery disease, type 2 diabetes, hypertension, chronic diastolic CHF, stroke, cerebrovascular disease, COPD, asthma, gastric outlet obstruction s/p prior J tube, anemia and hyponatremia, presented with shortness of breath and cough for worsening for about 2 weeks.  Assessment & Plan: Active Problems:   Persistent atrial fibrillation (HCC)   Hypothyroidism   CAP (community acquired pneumonia)   Chronic kidney insufficiency, stage 3 (moderate)   Hyponatremia   Diabetic polyneuropathy associated with type 2 diabetes mellitus (HCC)   Acute on chronic diastolic CHF (congestive heart failure), NYHA class 4 (HCC)   PEG (percutaneous endoscopic gastrostomy) status (HCC)   Possible community acquired pneumonia - Patient placed on ceftriaxone and azithromycin, continue, improving, today day 5/7 - Respiratory virus panel was negative - Continue supportive care - Remain on IV antibiotics, continues to be dyspneic, however still improved today after IV diuresis yesterday  Hypoxic respiratory failure - wean off oxygen as tolerated, she is on room air this morning  Acute on chronic diastolic congestive heart failure - Patient was initially on IV Lasix, this was converted to by mouth on 2/6, she lost ~ 6 lbs (141>135) and was converted to po, however she remained persistently dyspneic and cardiology was consulted on 2/8, and IV Lasix was resumed 2/8 - Mild creatinine increase, closely monitor, she is actually up 1 pound up at 136, continue IV Lasix - 2-D echo done on 05/09/16 and showed ejection fraction of 60-65% - Patient has no chest pain, troponin not elevated. - Continue Imdur, hydralazine  H/o gastric  outlet obstruction - With PEG-J tube, declogged by IR 2/7  Persistent atrial fibrillation / flutter - Continue amiodarone, diltiazem, Coumadin per pharmacy  Acute on chronic hyponatremia likely in the setting of CHF - Serum sodium has remained stable at 128 >> 127 >> 127  Chronic kidney disease is stage III  - Monitor BMP and electrolytes. Repeat lab in the morning. - Slight creatinine increase to 1.8,  Hypothyroidism  - Continue Synthroid - TSH 7.4, will need to be rechecked as an outpatient in ~ 3 weeks  Left leg pain, chronic in nature - Ultrasound negative for DVT  Goals of care discussion - DNR, thus with daughter at bedside today regarding palliative, she has seen within the past but is not too keen on discussing with them again. I brought up the idea of hospice however she seems reluctant   DVT prophylaxis: Coumadin Code Status: DNR Family Communication: daughter bedside Disposition Plan: d/c 1-2 days  Consultants:   Cardiology  Procedures:   None   Antimicrobials:  Ceftriaxone and azithromycin 2/5 >>  Subjective: - Overall feeling better, she feels she has more energy  Objective: Vitals:   05/11/16 2048 05/11/16 2219 05/12/16 0458 05/12/16 0728  BP: 118/65  (!) 135/50   Pulse: (!) 101  75   Resp: 20  20   Temp: 98 F (36.7 C)  98.3 F (36.8 C)   TempSrc: Oral  Oral   SpO2: 95% 92% 95% 95%  Weight:   62 kg (136 lb 9.6 oz)   Height:        Intake/Output Summary (Last 24 hours) at 05/12/16 1038 Last data filed at 05/12/16 0943  Gross per 24 hour  Intake  2430 ml  Output              902 ml  Net             1528 ml   Filed Weights   05/09/16 0453 05/10/16 0349 05/12/16 0458  Weight: 63 kg (139 lb) 61.6 kg (135 lb 14.4 oz) 62 kg (136 lb 9.6 oz)    Examination: Constitutional: NAD Vitals:   05/11/16 2048 05/11/16 2219 05/12/16 0458 05/12/16 0728  BP: 118/65  (!) 135/50   Pulse: (!) 101  75   Resp: 20  20   Temp: 98 F (36.7  C)  98.3 F (36.8 C)   TempSrc: Oral  Oral   SpO2: 95% 92% 95% 95%  Weight:   62 kg (136 lb 9.6 oz)   Height:       Eyes: PERRL, eyes with crusty discharge ENMT: Mucous membranes are moist.  Neck: normal, supple, no masses Respiratory: clear to auscultation bilaterally, no wheezing, no crackles. Normal respiratory effort. No accessory muscle use.  Cardiovascular: Regular rate and rhythm, no murmurs / rubs / gallops. Trace LE edema. 2+ pedal pulses.  Abdomen: no tenderness. Bowel sounds positive.  Musculoskeletal: no clubbing / cyanosis. Neurologic: non focal    Data Reviewed: I have personally reviewed following labs and imaging studies  CBC:  Recent Labs Lab 05/08/16 1241 05/09/16 0953 05/12/16 0350  WBC 6.4 6.2 5.8  HGB 10.2* 10.4* 9.7*  HCT 30.2* 30.3* 28.6*  MCV 81.0 81.7 82.7  PLT 301 287 284   Basic Metabolic Panel:  Recent Labs Lab 05/08/16 1241 05/09/16 0953 05/10/16 0515 05/11/16 0545 05/12/16 0350  NA 123* 128* 128* 127* 127*  K 4.3 3.8 4.2 3.7 3.6  CL 86* 87* 88* 88* 89*  CO2 26 28 25 26 25   GLUCOSE 127* 170* 140* 130* 209*  BUN 56* 51* 52* 47* 56*  CREATININE 1.63* 1.44* 1.55* 1.59* 1.82*  CALCIUM 9.6 9.5 9.5 9.5 9.0   Coagulation Profile:  Recent Labs Lab 05/08/16 1642 05/09/16 0953 05/10/16 0515 05/11/16 0545 05/12/16 0350  INR 1.40 1.31 1.41 1.36 1.47   BNP (last 3 results)  Recent Labs  05/08/16 1053  PROBNP 1,132*   CBG:  Recent Labs Lab 05/11/16 0731 05/11/16 1136 05/11/16 1701 05/11/16 2050 05/12/16 0736  GLUCAP 130* 182* 213* 182* 205*   Thyroid Function Tests: No results for input(s): TSH, T4TOTAL, FREET4, T3FREE, THYROIDAB in the last 72 hours. Urine analysis:    Component Value Date/Time   COLORURINE YELLOW 05/10/2016 1539   APPEARANCEUR CLEAR 05/10/2016 1539   LABSPEC 1.009 05/10/2016 1539   PHURINE 7.0 05/10/2016 1539   GLUCOSEU NEGATIVE 05/10/2016 1539   HGBUR NEGATIVE 05/10/2016 1539   BILIRUBINUR  NEGATIVE 05/10/2016 1539   KETONESUR NEGATIVE 05/10/2016 1539   PROTEINUR NEGATIVE 05/10/2016 1539   UROBILINOGEN 0.2 01/27/2015 2308   NITRITE NEGATIVE 05/10/2016 1539   LEUKOCYTESUR NEGATIVE 05/10/2016 1539   Sepsis Labs: Invalid input(s): PROCALCITONIN, LACTICIDVEN  Recent Results (from the past 240 hour(s))  Blood Culture (routine x 2)     Status: None (Preliminary result)   Collection Time: 05/08/16  4:30 PM  Result Value Ref Range Status   Specimen Description BLOOD LEFT FOREARM  Final   Special Requests IN PEDIATRIC BOTTLE 1CC  Final   Culture NO GROWTH 3 DAYS  Final   Report Status PENDING  Incomplete  Blood Culture (routine x 2)     Status: None (Preliminary result)   Collection Time:  05/08/16  4:35 PM  Result Value Ref Range Status   Specimen Description BLOOD RIGHT HAND  Final   Special Requests BOTTLES DRAWN AEROBIC AND ANAEROBIC 5CC  Final   Culture NO GROWTH 3 DAYS  Final   Report Status PENDING  Incomplete  MRSA PCR Screening     Status: None   Collection Time: 05/08/16  7:32 PM  Result Value Ref Range Status   MRSA by PCR NEGATIVE NEGATIVE Final    Comment:        The GeneXpert MRSA Assay (FDA approved for NASAL specimens only), is one component of a comprehensive MRSA colonization surveillance program. It is not intended to diagnose MRSA infection nor to guide or monitor treatment for MRSA infections.   Respiratory Panel by PCR     Status: None   Collection Time: 05/08/16  7:35 PM  Result Value Ref Range Status   Adenovirus NOT DETECTED NOT DETECTED Final   Coronavirus 229E NOT DETECTED NOT DETECTED Final   Coronavirus HKU1 NOT DETECTED NOT DETECTED Final   Coronavirus NL63 NOT DETECTED NOT DETECTED Final   Coronavirus OC43 NOT DETECTED NOT DETECTED Final   Metapneumovirus NOT DETECTED NOT DETECTED Final   Rhinovirus / Enterovirus NOT DETECTED NOT DETECTED Final   Influenza A NOT DETECTED NOT DETECTED Final   Influenza B NOT DETECTED NOT DETECTED  Final   Parainfluenza Virus 1 NOT DETECTED NOT DETECTED Final   Parainfluenza Virus 2 NOT DETECTED NOT DETECTED Final   Parainfluenza Virus 3 NOT DETECTED NOT DETECTED Final   Parainfluenza Virus 4 NOT DETECTED NOT DETECTED Final   Respiratory Syncytial Virus NOT DETECTED NOT DETECTED Final   Bordetella pertussis NOT DETECTED NOT DETECTED Final   Chlamydophila pneumoniae NOT DETECTED NOT DETECTED Final   Mycoplasma pneumoniae NOT DETECTED NOT DETECTED Final  Culture, sputum-assessment     Status: None   Collection Time: 05/09/16  4:01 AM  Result Value Ref Range Status   Specimen Description EXPECTORATED SPUTUM  Final   Special Requests NONE  Final   Sputum evaluation   Final    Sputum specimen not acceptable for testing.  Please recollect.   Gram Stain Report Called to,Read Back By and Verified With: R CULBREN,RN @0710  05/09/16 MKELLY,MLT   Report Status 05/09/2016 FINAL  Final      Radiology Studies: Dg Chest 2 View  Result Date: 05/11/2016 CLINICAL DATA:  Dyspnea, community-acquired pneumonia, CHF, chronic renal insufficiency. EXAM: CHEST  2 VIEW COMPARISON:  Chest x-ray of May 08, 2016 FINDINGS: Since a previous study there has developed confluent alveolar opacity in the left mid and lower lung. The left hemidiaphragm is nearly obscured. The right lung is adequately inflated and clear. The pulmonary vascularity is not clearly engorged. The left heart border is now obscured. There is calcification in the wall of the aortic arch. There is chronic partial compression of the body of T11. IMPRESSION: Interval development of alveolar opacity in the left lung likely in the lingula which is not clearly visible on the lateral view this may reflect pneumonia or atelectasis. There is a new small left pleural effusion. No CHF. Thoracic aortic atherosclerosis. Electronically Signed   By: David  [place] M.D.   On: 05/11/2016 13:06     Scheduled Meds: . atorvastatin  10 mg Oral Daily  .  azithromycin  500 mg Intravenous Q24H  . bacitracin-polymyxin b   Both Eyes BID  . bisoprolol  5 mg Oral Daily  . calcium-vitamin D  1 tablet Oral  Q breakfast  . cefTRIAXone (ROCEPHIN)  IV  1 g Intravenous Q24H  . chlorhexidine  15 mL Mouth/Throat BID  . cycloSPORINE  1 drop Both Eyes BID  . diltiazem  120 mg Oral Daily  . feeding supplement (GLUCERNA SHAKE)  237 mL Oral q12n4p  . feeding supplement (PRO-STAT SUGAR FREE 64)  30 mL Per Tube Daily  . fluticasone  2 spray Each Nare Daily  . furosemide  40 mg Oral BID  . gabapentin  300 mg Oral QHS  . hydrALAZINE  50 mg Oral TID  . insulin aspart  0-9 Units Subcutaneous TID WC  . ipratropium-albuterol  3 mL Nebulization TID  . isosorbide mononitrate  30 mg Oral Daily  . latanoprost  1 drop Both Eyes QHS  . levothyroxine  137 mcg Oral QAC breakfast  . loratadine  10 mg Oral Daily  . multivitamin with minerals  1 tablet Oral Daily  . pantoprazole  40 mg Oral BID  . polyvinyl alcohol  1 drop Both Eyes QID  . sertraline  100 mg Oral Daily  . warfarin  7.5 mg Oral ONCE-1800  . Warfarin - Pharmacist Dosing Inpatient   Does not apply q1800   Continuous Infusions: . feeding supplement (OSMOLITE 1.5 CAL) Stopped (05/09/16 2000)   Pamella Pert, MD, PhD [facility] Pager [phone]  If 7PM-7AM, please contact night-coverage [email or web address] Password [email or web address] 05/12/2016, 10:38 AM

## 2016-05-13 DIAGNOSIS — I483 Typical atrial flutter: Secondary | ICD-10-CM

## 2016-05-13 LAB — GLUCOSE, CAPILLARY
Glucose-Capillary: 191 mg/dL — ABNORMAL HIGH (ref 65–99)
Glucose-Capillary: 158 mg/dL — ABNORMAL HIGH (ref 65–99)
Glucose-Capillary: 131 mg/dL — ABNORMAL HIGH (ref 65–99)
Glucose-Capillary: 199 mg/dL — ABNORMAL HIGH (ref 65–99)

## 2016-05-13 LAB — CULTURE, BLOOD (ROUTINE X 2)
Culture: NO GROWTH
Culture: NO GROWTH

## 2016-05-13 LAB — PROTIME-INR
INR: 1.73
Prothrombin Time: 20.4 seconds — ABNORMAL HIGH (ref 11.4–15.2)

## 2016-05-13 LAB — BASIC METABOLIC PANEL
Anion gap: 11 (ref 5–15)
BUN: 48 mg/dL — ABNORMAL HIGH (ref 6–20)
CO2: 27 mmol/L (ref 22–32)
Calcium: 9.3 mg/dL (ref 8.9–10.3)
Chloride: 93 mmol/L — ABNORMAL LOW (ref 101–111)
Creatinine, Ser: 1.31 mg/dL — ABNORMAL HIGH (ref 0.44–1.00)
GFR calc Af Amer: 40 mL/min — ABNORMAL LOW (ref >60)
GFR calc non Af Amer: 35 mL/min — ABNORMAL LOW (ref >60)
Glucose, Bld: 93 mg/dL (ref 65–99)
Potassium: 4 mmol/L (ref 3.5–5.1)
Sodium: 131 mmol/L — ABNORMAL LOW (ref 135–145)

## 2016-05-13 MED ORDER — WARFARIN SODIUM 5 MG PO TABS
5.0000 mg | ORAL_TABLET | Freq: Once | ORAL | Status: AC
  Administered 2016-05-13: 5 mg via ORAL
  Filled 2016-05-13: qty 1

## 2016-05-13 NOTE — Progress Notes (Signed)
Page to Dr Cruzita Lederer  "3d06 Shannon [name] Tube is clogged. unable to flush manually; appears most of night feeding did complete. please advise."

## 2016-05-13 NOTE — Progress Notes (Signed)
PROGRESS NOTE  Shannon [name] ZOX:[id number] DOB: Feb 12, 1924 DOA: 05/08/2016 PCP: Kirt Boys, DO   LOS: 5 days   Brief Narrative: 83 y.o.femalewith medical history significant of persistent atrial fibrillation on amiodarone, bradycardia, CKD stage III,hypothyroidism,non-obstructive coronary artery disease, type 2 diabetes, hypertension, chronic diastolic CHF, stroke, cerebrovascular disease, COPD, asthma, gastric outlet obstruction s/p prior J tube, anemia and hyponatremia, presented with shortness of breath and cough for worsening for about 2 weeks.  Assessment & Plan: Active Problems:   Persistent atrial fibrillation (HCC)   Hypothyroidism   CAP (community acquired pneumonia)   Chronic kidney insufficiency, stage 3 (moderate)   Hyponatremia   Diabetic polyneuropathy associated with type 2 diabetes mellitus (HCC)   Acute on chronic diastolic CHF (congestive heart failure), NYHA class 4 (HCC)   PEG (percutaneous endoscopic gastrostomy) status (HCC)   HCAP (healthcare-associated pneumonia)   Possible community acquired pneumonia - Patient placed on ceftriaxone and azithromycin, continue, improving, today day 6/7 - Respiratory virus panel was negative - Continue supportive care - Remain on IV antibiotics, dyspnea improving  Hypoxic respiratory failure - wean off oxygen as tolerated, now resolved.  - she remains on room air this morning  Acute on chronic diastolic congestive heart failure - Patient was initially on IV Lasix, this was converted to by mouth on 2/6, she lost ~ 6 lbs (141>135) and was converted to po, however she remained persistently dyspneic and cardiology was consulted on 2/8, and IV Lasix was resumed 2/8 - BMP pending this morning - 2-D echo done on 05/09/16 and showed ejection fraction of 60-65% - Patient has no chest pain, troponin not elevated. - Continue Imdur, hydralazine  H/o gastric outlet obstruction - With PEG-J tube, declogged by IR 2/7, now clogged  again. Have asked IR to re-evaluate  Persistent atrial fibrillation / flutter - Continue amiodarone, diltiazem, Coumadin per pharmacy  Acute on chronic hyponatremia likely in the setting of CHF - Serum sodium has remained stable at 128 >> 127 >> 127  Chronic kidney disease is stage III  - Monitor BMP and electrolytes. Repeat lab in the morning. - Slight creatinine increase to 1.8, this morning BMP pending  Hypothyroidism  - Continue Synthroid - TSH 7.4, will need to be rechecked as an outpatient in ~ 3 weeks  Left leg pain, chronic in nature - Ultrasound negative for DVT  Goals of care discussion - DNR, thus with daughter at bedside today regarding palliative, she has seen within the past but is not too keen on discussing with them again. I brought up the idea of hospice however she seems reluctant   DVT prophylaxis: Coumadin Code Status: DNR Family Communication: daughter bedside Disposition Plan: d/c 1-2 days  Consultants:   Cardiology  Procedures:   None   Antimicrobials:  Ceftriaxone and azithromycin 2/5 >>  Subjective: - Overall feeling better, eating some breakfast today. Breathing has improved. No chest pain  Objective: Vitals:   05/12/16 1934 05/12/16 2117 05/13/16 0423 05/13/16 0835  BP: 119/65  134/68   Pulse: 79  82   Resp: 18  18   Temp: 98.2 F (36.8 C)  98.2 F (36.8 C)   TempSrc: Oral  Oral   SpO2: 95% 94% 97% 98%  Weight:   62.6 kg (138 lb)   Height:        Intake/Output Summary (Last 24 hours) at 05/13/16 1049 Last data filed at 05/13/16 0925  Gross per 24 hour  Intake  1120 ml  Output             1350 ml  Net             -230 ml   Filed Weights   05/10/16 0349 05/12/16 0458 05/13/16 0423  Weight: 61.6 kg (135 lb 14.4 oz) 62 kg (136 lb 9.6 oz) 62.6 kg (138 lb)    Examination: Constitutional: NAD Vitals:   05/12/16 1934 05/12/16 2117 05/13/16 0423 05/13/16 0835  BP: 119/65  134/68   Pulse: 79  82   Resp: 18  18    Temp: 98.2 F (36.8 C)  98.2 F (36.8 C)   TempSrc: Oral  Oral   SpO2: 95% 94% 97% 98%  Weight:   62.6 kg (138 lb)   Height:       Eyes: PERRL, eyes with crusty discharge ENMT: Mucous membranes are moist.  Neck: normal, supple, no masses Respiratory: clear to auscultation bilaterally, no wheezing, no crackles. Normal respiratory effort. No accessory muscle use.  Cardiovascular: Regular rate and rhythm, no murmurs / rubs / gallops. Trace LE edema. 2+ pedal pulses.  Abdomen: no tenderness. Bowel sounds positive.  Musculoskeletal: no clubbing / cyanosis. Neurologic: non focal    Data Reviewed: I have personally reviewed following labs and imaging studies  CBC:  Recent Labs Lab 05/08/16 1241 05/09/16 0953 05/12/16 0350  WBC 6.4 6.2 5.8  HGB 10.2* 10.4* 9.7*  HCT 30.2* 30.3* 28.6*  MCV 81.0 81.7 82.7  PLT 301 287 284   Basic Metabolic Panel:  Recent Labs Lab 05/08/16 1241 05/09/16 0953 05/10/16 0515 05/11/16 0545 05/12/16 0350  NA 123* 128* 128* 127* 127*  K 4.3 3.8 4.2 3.7 3.6  CL 86* 87* 88* 88* 89*  CO2 26 28 25 26 25   GLUCOSE 127* 170* 140* 130* 209*  BUN 56* 51* 52* 47* 56*  CREATININE 1.63* 1.44* 1.55* 1.59* 1.82*  CALCIUM 9.6 9.5 9.5 9.5 9.0   Coagulation Profile:  Recent Labs Lab 05/09/16 0953 05/10/16 0515 05/11/16 0545 05/12/16 0350 05/13/16 0433  INR 1.31 1.41 1.36 1.47 1.73   BNP (last 3 results)  Recent Labs  05/08/16 1053  PROBNP 1,132*   CBG:  Recent Labs Lab 05/12/16 0736 05/12/16 1152 05/12/16 1617 05/12/16 2114 05/13/16 0748  GLUCAP 205* 137* 183* 139* 191*   Thyroid Function Tests: No results for input(s): TSH, T4TOTAL, FREET4, T3FREE, THYROIDAB in the last 72 hours. Urine analysis:    Component Value Date/Time   COLORURINE YELLOW 05/10/2016 1539   APPEARANCEUR CLEAR 05/10/2016 1539   LABSPEC 1.009 05/10/2016 1539   PHURINE 7.0 05/10/2016 1539   GLUCOSEU NEGATIVE 05/10/2016 1539   HGBUR NEGATIVE 05/10/2016 1539     BILIRUBINUR NEGATIVE 05/10/2016 1539   KETONESUR NEGATIVE 05/10/2016 1539   PROTEINUR NEGATIVE 05/10/2016 1539   UROBILINOGEN 0.2 01/27/2015 2308   NITRITE NEGATIVE 05/10/2016 1539   LEUKOCYTESUR NEGATIVE 05/10/2016 1539   Sepsis Labs: Invalid input(s): PROCALCITONIN, LACTICIDVEN  Recent Results (from the past 240 hour(s))  Blood Culture (routine x 2)     Status: None (Preliminary result)   Collection Time: 05/08/16  4:30 PM  Result Value Ref Range Status   Specimen Description BLOOD LEFT FOREARM  Final   Special Requests IN PEDIATRIC BOTTLE 1CC  Final   Culture NO GROWTH 4 DAYS  Final   Report Status PENDING  Incomplete  Blood Culture (routine x 2)     Status: None (Preliminary result)   Collection Time: 05/08/16  4:35 PM  Result Value Ref Range Status   Specimen Description BLOOD RIGHT HAND  Final   Special Requests BOTTLES DRAWN AEROBIC AND ANAEROBIC 5CC  Final   Culture NO GROWTH 4 DAYS  Final   Report Status PENDING  Incomplete  MRSA PCR Screening     Status: None   Collection Time: 05/08/16  7:32 PM  Result Value Ref Range Status   MRSA by PCR NEGATIVE NEGATIVE Final    Comment:        The GeneXpert MRSA Assay (FDA approved for NASAL specimens only), is one component of a comprehensive MRSA colonization surveillance program. It is not intended to diagnose MRSA infection nor to guide or monitor treatment for MRSA infections.   Respiratory Panel by PCR     Status: None   Collection Time: 05/08/16  7:35 PM  Result Value Ref Range Status   Adenovirus NOT DETECTED NOT DETECTED Final   Coronavirus 229E NOT DETECTED NOT DETECTED Final   Coronavirus HKU1 NOT DETECTED NOT DETECTED Final   Coronavirus NL63 NOT DETECTED NOT DETECTED Final   Coronavirus OC43 NOT DETECTED NOT DETECTED Final   Metapneumovirus NOT DETECTED NOT DETECTED Final   Rhinovirus / Enterovirus NOT DETECTED NOT DETECTED Final   Influenza A NOT DETECTED NOT DETECTED Final   Influenza B NOT DETECTED  NOT DETECTED Final   Parainfluenza Virus 1 NOT DETECTED NOT DETECTED Final   Parainfluenza Virus 2 NOT DETECTED NOT DETECTED Final   Parainfluenza Virus 3 NOT DETECTED NOT DETECTED Final   Parainfluenza Virus 4 NOT DETECTED NOT DETECTED Final   Respiratory Syncytial Virus NOT DETECTED NOT DETECTED Final   Bordetella pertussis NOT DETECTED NOT DETECTED Final   Chlamydophila pneumoniae NOT DETECTED NOT DETECTED Final   Mycoplasma pneumoniae NOT DETECTED NOT DETECTED Final  Culture, sputum-assessment     Status: None   Collection Time: 05/09/16  4:01 AM  Result Value Ref Range Status   Specimen Description EXPECTORATED SPUTUM  Final   Special Requests NONE  Final   Sputum evaluation   Final    Sputum specimen not acceptable for testing.  Please recollect.   Gram Stain Report Called to,Read Back By and Verified With: R CULBREN,RN @0710  05/09/16 MKELLY,MLT   Report Status 05/09/2016 FINAL  Final      Radiology Studies: Dg Chest 2 View  Result Date: 05/11/2016 CLINICAL DATA:  Dyspnea, community-acquired pneumonia, CHF, chronic renal insufficiency. EXAM: CHEST  2 VIEW COMPARISON:  Chest x-ray of May 08, 2016 FINDINGS: Since a previous study there has developed confluent alveolar opacity in the left mid and lower lung. The left hemidiaphragm is nearly obscured. The right lung is adequately inflated and clear. The pulmonary vascularity is not clearly engorged. The left heart border is now obscured. There is calcification in the wall of the aortic arch. There is chronic partial compression of the body of T11. IMPRESSION: Interval development of alveolar opacity in the left lung likely in the lingula which is not clearly visible on the lateral view this may reflect pneumonia or atelectasis. There is a new small left pleural effusion. No CHF. Thoracic aortic atherosclerosis. Electronically Signed   By: David  [place] M.D.   On: 05/11/2016 13:06     Scheduled Meds: . atorvastatin  10 mg Oral Daily    . azithromycin  500 mg Intravenous Q24H  . bacitracin-polymyxin b   Both Eyes BID  . bisoprolol  5 mg Oral Daily  . calcium-vitamin D  1 tablet Oral Q breakfast  .  cefTRIAXone (ROCEPHIN)  IV  1 g Intravenous Q24H  . chlorhexidine  15 mL Mouth/Throat BID  . cycloSPORINE  1 drop Both Eyes BID  . diltiazem  120 mg Oral Daily  . feeding supplement (GLUCERNA SHAKE)  237 mL Oral q12n4p  . feeding supplement (PRO-STAT SUGAR FREE 64)  30 mL Per Tube Daily  . fluticasone  2 spray Each Nare Daily  . furosemide  40 mg Oral Daily  . gabapentin  300 mg Oral QHS  . hydrALAZINE  50 mg Oral TID  . insulin aspart  0-9 Units Subcutaneous TID WC  . ipratropium-albuterol  3 mL Nebulization TID  . isosorbide mononitrate  30 mg Oral Daily  . latanoprost  1 drop Both Eyes QHS  . levothyroxine  137 mcg Oral QAC breakfast  . loratadine  10 mg Oral Daily  . multivitamin with minerals  1 tablet Oral Daily  . pantoprazole  40 mg Oral BID  . polyvinyl alcohol  1 drop Both Eyes QID  . sertraline  100 mg Oral Daily  . warfarin  5 mg Oral ONCE-1800  . Warfarin - Pharmacist Dosing Inpatient   Does not apply q1800   Continuous Infusions: . feeding supplement (OSMOLITE 1.5 CAL) 1,000 mL (05/12/16 2114)   Pamella Pert, MD, PhD [facility] Pager [phone]  If 7PM-7AM, please contact night-coverage [email or web address] Password [facility] 05/13/2016, 10:49 AM

## 2016-05-13 NOTE — Progress Notes (Signed)
Dressing changed at g/j tube, pt now denies any discomfort.

## 2016-05-13 NOTE — Progress Notes (Signed)
Page to Dr Cruzita Lederer,  "3e06 pt c/o left side chest/abdominal pain. EKG complete, vitals WNL, upon further inspect. pt c/o discomfort around G/J Tube. Pt reports resolution of sx"

## 2016-05-13 NOTE — Progress Notes (Signed)
pts daughter vivian notified of plan to replace G/J Tube Monday morning. Shannon [name] informed that pt is aware and we will take her ensure/nepro nutritional supplements prior to extra po fluids etc to receive max nutrition without tube feedings.

## 2016-05-13 NOTE — Progress Notes (Signed)
IR called to assess clogged J-tube again. Was just declogged a few days and is now obstructed again.  RN says TF ran fine and flushed well last pm.  Precipitous debris seen in J-limb Multiple attempts to unclog using various syringes with NS/H2O Ultimately unsuccessful Given that se has re-clogged so quickly, this is likely to keep happening even with successful unclogging. Therefore, would recommend we bring her down to IR Mon for image guided exchange of G-J tube.  Ascencion Dike PA-C Interventional Radiology 05/13/2016 1:16 PM

## 2016-05-13 NOTE — Progress Notes (Signed)
Pt changed to room air, maintained saturations of 95% in no distress at rest. O2 nasal canula left beside patient for moments of transfer to [facility]

## 2016-05-13 NOTE — Progress Notes (Signed)
Upon entering room to stop tube feeding, pump was found with "tube clogged" notification versus the expected "completed" notification. Nearly all of the feeding was complete, it is unclear how the tube clogged. Pt has been 'declogged' multiple times in the past week.

## 2016-05-13 NOTE — Progress Notes (Addendum)
[place] for warfarin Indication: atrial fibrillation  Allergies  Allergen Reactions  . Penicillins Itching and Rash    Has patient had a PCN reaction causing immediate rash, facial/tongue/throat swelling, SOB or lightheadedness with hypotension: Yes Has patient had a PCN reaction causing severe rash involving mucus membranes or skin necrosis: Unk Has patient had a PCN reaction that required hospitalization: Unk Has patient had a PCN reaction occurring within the last 10 years: Unk If all of the above answers are "NO", then may proceed with Cephalosporin use.     Patient Measurements: Height: 5\' 6"  (167.6 cm) Weight: 138 lb (62.6 kg) IBW/kg (Calculated) : 59.3 Heparin Dosing Weight: n/a  Vital Signs: Temp: 98.2 F (36.8 C) (02/10 0423) Temp Source: Oral (02/10 0423) BP: 134/68 (02/10 0423) Pulse Rate: 82 (02/10 0423)  Labs:  Recent Labs  05/11/16 0545 05/12/16 0350 05/13/16 0433  HGB  --  9.7*  --   HCT  --  28.6*  --   PLT  --  284  --   LABPROT 16.9* 17.9* 20.4*  INR 1.36 1.47 1.73  CREATININE 1.59* 1.82*  --     Estimated Creatinine Clearance: 19.2 mL/min (by C-G formula based on SCr of 1.82 mg/dL (H)).   Assessment: 29 yoF on warfarin PTA for atrial fibrillation to resume per pharmacy on admit. She did not received warfarin doses on 2/5 and 2/6.   INR remains subtherapeutic at 1.73 but is slowly trending up after boost doses. Patient is supposed to be receiving tube feeds but tube is currently clogged. Per RN she is taking pills okay. Serum creatinine trending up and was 1.82 yesterday. Amiodarone was stopped 2/8. Antibiotics will be ending on 2/12.   PTA warfarin dose = 3.75mg  daily except 2.5mg  on Mondays  Given that patient's INR is still subtherapeutic but is trending in the right direction, will give smaller boost dose tonight.  Goal of Therapy:  INR 2-3 Monitor platelets by anticoagulation protocol: Yes   Plan:   -Warfarin 5 mg po x 1 tonight -Monitor INR, S/Sx bleeding daily -Monitor tube feeds/intake and drug interactions  Demetrius Charity, PharmD Acute Care Pharmacy Resident  Pager: [phone] 05/13/2016

## 2016-05-13 NOTE — Progress Notes (Signed)
Progress Note  Patient Name: Shannon [name] Date of Encounter: 05/13/2016  Primary Cardiologist: Dr. Ena Dawley  Subjective   No chest pain or palpitations.  Inpatient Medications    Scheduled Meds: . atorvastatin  10 mg Oral Daily  . azithromycin  500 mg Intravenous Q24H  . bacitracin-polymyxin b   Both Eyes BID  . bisoprolol  5 mg Oral Daily  . calcium-vitamin D  1 tablet Oral Q breakfast  . cefTRIAXone (ROCEPHIN)  IV  1 g Intravenous Q24H  . chlorhexidine  15 mL Mouth/Throat BID  . cycloSPORINE  1 drop Both Eyes BID  . diltiazem  120 mg Oral Daily  . feeding supplement (GLUCERNA SHAKE)  237 mL Oral q12n4p  . feeding supplement (PRO-STAT SUGAR FREE 64)  30 mL Per Tube Daily  . fluticasone  2 spray Each Nare Daily  . furosemide  40 mg Oral Daily  . gabapentin  300 mg Oral QHS  . hydrALAZINE  50 mg Oral TID  . insulin aspart  0-9 Units Subcutaneous TID WC  . ipratropium-albuterol  3 mL Nebulization TID  . isosorbide mononitrate  30 mg Oral Daily  . latanoprost  1 drop Both Eyes QHS  . levothyroxine  137 mcg Oral QAC breakfast  . loratadine  10 mg Oral Daily  . multivitamin with minerals  1 tablet Oral Daily  . pantoprazole  40 mg Oral BID  . polyvinyl alcohol  1 drop Both Eyes QID  . sertraline  100 mg Oral Daily  . warfarin  5 mg Oral ONCE-1800  . Warfarin - Pharmacist Dosing Inpatient   Does not apply q1800   Continuous Infusions: . feeding supplement (OSMOLITE 1.5 CAL) 1,000 mL (05/12/16 2114)   PRN Meds: acetaminophen, albuterol, benzonatate, bisacodyl, HYDROcodone-acetaminophen, RESOURCE THICKENUP CLEAR, traMADol   Vital Signs    Vitals:   05/12/16 1934 05/12/16 2117 05/13/16 0423 05/13/16 0835  BP: 119/65  134/68   Pulse: 79  82   Resp: 18  18   Temp: 98.2 F (36.8 C)  98.2 F (36.8 C)   TempSrc: Oral  Oral   SpO2: 95% 94% 97% 98%  Weight:   138 lb (62.6 kg)   Height:        Intake/Output Summary (Last 24 hours) at 05/13/16 1027 Last data  filed at 05/13/16 0925  Gross per 24 hour  Intake             1120 ml  Output             1350 ml  Net             -230 ml   Filed Weights   05/10/16 0349 05/12/16 0458 05/13/16 0423  Weight: 135 lb 14.4 oz (61.6 kg) 136 lb 9.6 oz (62 kg) 138 lb (62.6 kg)    Telemetry    I personally reviewed telemetry which shows atrial flutter with variable conduction.  ECG    I personally reviewed the tracing from 05/08/2016 which showed atrial flutter with variable conduction.  Physical Exam   GEN: Frail elderly woman, no acute distress.  Neck: No JVD Cardiac: Irregularly irregular, soft systolic murmur, no gallop.  Respiratory: Clear to auscultation bilaterally. GI: Soft, non-distended, PEG tube. MS: No edema; kyphosis.  Labs    Chemistry Recent Labs Lab 05/10/16 0515 05/11/16 0545 05/12/16 0350  NA 128* 127* 127*  K 4.2 3.7 3.6  CL 88* 88* 89*  CO2 25 26 25   GLUCOSE 140* 130* 209*  BUN 52* 47* 56*  CREATININE 1.55* 1.59* 1.82*  CALCIUM 9.5 9.5 9.0  GFRNONAA 28* 27* 23*  GFRAA 33* 32* 27*  ANIONGAP 15 13 13      Hematology Recent Labs Lab 05/08/16 1241 05/09/16 0953 05/12/16 0350  WBC 6.4 6.2 5.8  RBC 3.73* 3.71* 3.46*  HGB 10.2* 10.4* 9.7*  HCT 30.2* 30.3* 28.6*  MCV 81.0 81.7 82.7  MCH 27.3 28.0 28.0  MCHC 33.8 34.3 33.9  RDW 18.2* 18.7* 19.0*  PLT 301 287 284    Cardiac EnzymesNo results for input(s): TROPONINI in the last 168 hours.  Recent Labs Lab 05/08/16 1254  TROPIPOC 0.02     Radiology    Dg Chest 2 View  Result Date: 05/11/2016 CLINICAL DATA:  Dyspnea, community-acquired pneumonia, CHF, chronic renal insufficiency. EXAM: CHEST  2 VIEW COMPARISON:  Chest x-ray of May 08, 2016 FINDINGS: Since a previous study there has developed confluent alveolar opacity in the left mid and lower lung. The left hemidiaphragm is nearly obscured. The right lung is adequately inflated and clear. The pulmonary vascularity is not clearly engorged. The left heart  border is now obscured. There is calcification in the wall of the aortic arch. There is chronic partial compression of the body of T11. IMPRESSION: Interval development of alveolar opacity in the left lung likely in the lingula which is not clearly visible on the lateral view this may reflect pneumonia or atelectasis. There is a new small left pleural effusion. No CHF. Thoracic aortic atherosclerosis. Electronically Signed   By: David  Martinique M.D.   On: 05/11/2016 13:06    Cardiac Studies   Echocardiogram 05/09/2016: Study Conclusions  - Left ventricle: The cavity size was normal. Wall thickness was   normal. Systolic function was normal. The estimated ejection   fraction was in the range of 60% to 65%. Wall motion was normal;   there were no regional wall motion abnormalities. - Aortic valve: There was trivial regurgitation. - Left atrium: The atrium was mildly dilated. Volume/bsa, ES,   (1-plane Simpson&'s, A2C): 45.6 ml/m^2. - Right atrium: The atrium was moderately dilated. - Tricuspid valve: There was moderate regurgitation. - Pulmonary arteries: Systolic pressure was mildly increased. PA   peak pressure: 31 mm Hg (S).  Impressions:  - No cardiac source of emboli was indentified.  Patient Profile     83 y.o. female with persistent atrial flutter, CKD stage III, hypothyroidism, nonobstructive CAD, previous stroke, COPD, gastric outlet obstruction status post PEG placement, and anemia. She is currently admitted with cough and shortness of breath, being treated for pneumonia and volume overload.  Assessment & Plan    1. Persistent atrial flutter with variable conduction. She has been taken off of amiodarone, currently on heart rate control strategy with bisoprolol and Cardizem CD. Coumadin is being adjusted by pharmacy, INR up to 1.73.  2. Acute on chronic diastolic heart failure, recent echocardiogram noted above. Lasix now at 40 mg oral daily. Intake and output fairly even.  3.  Acute on chronic renal insufficiency, creatinine 1.8.  Current cardiac regimen includes bisoprolol, Cardizem CD, Lasix 40 mg daily, Lipitor, hydralazine, and Imdur. Heart rate control of atrial flutter looks to be adequate, in the 90-100 range. Need to work toward stable diuretic dosing regimen. She was on Lasix 40 mg twice daily at home. Follow-up BMET.   Signed, Rozann Lesches, MD  05/13/2016, 10:27 AM

## 2016-05-13 NOTE — Evaluation (Signed)
Clinical/Bedside Swallow Evaluation Patient Details  Name: Shannon [name] MRN: [id number] Date of Birth: [birth year]  Today's Date: 05/13/2016 Time: SLP Start Time (ACUTE ONLY): 7253 SLP Stop Time (ACUTE ONLY): 0855 SLP Time Calculation (min) (ACUTE ONLY): 17 min  Past Medical History:  Past Medical History:  Diagnosis Date  . Anemia    previous blood transfusions  . Arthritis    "all over"  . Asthma   . Bradycardia    requiring previous d/c of BB and reduction of amiodarone  . CAD (coronary artery disease)    nonobstructive per notes  . Chronic diastolic CHF (congestive heart failure) (HCC)   . CKD (chronic kidney disease), stage III   . Complication of blood transfusion    "got the wrong blood type at [facility] in ~ 2015; no adverse reaction that we are aware of"/daughter, [name] (01/27/2016)  . COPD (chronic obstructive pulmonary disease) (HCC)   . Depression    "light case"  . DVT (deep venous thrombosis) (HCC) 01/2016   a. LLE DVT 01/2016 - switched from Eliquis to Coumadin.  . Gastric stenosis    a. s/p stomach tube  . GERD (gastroesophageal reflux disease)   . History of blood transfusion    "several" (01/27/2016)  . History of stomach ulcers   . Hyperlipidemia   . Hypertension   . Hypothyroidism   . Paraesophageal hernia   . Perforated gastric ulcer (HCC)   . Seasonal allergies   . SIADH (syndrome of inappropriate ADH production) (HCC)    [name] 01/10/2015  . Small bowel obstruction    "I don't know how many" (01/11/2015)  . Stroke (HCC)    "light one"  . Type II diabetes mellitus (HCC)    "related to prednisone use  for > 20 yr; once predinose stopped; no more DM RX" (01/27/2016)  . Ventral hernia with bowel obstruction    Past Surgical History:  Past Surgical History:  Procedure Laterality Date  . CATARACT EXTRACTION W/ INTRAOCULAR LENS  IMPLANT, BILATERAL    . CHOLECYSTECTOMY OPEN    . COLECTOMY    . ESOPHAGOGASTRODUODENOSCOPY N/A 01/19/2014   Procedure:  ESOPHAGOGASTRODUODENOSCOPY (EGD);  Surgeon: Hilarie Fredrickson, MD;  Location: [facility];  Service: Endoscopy;  Laterality: N/A;  . ESOPHAGOGASTRODUODENOSCOPY N/A 01/20/2014   Procedure: ESOPHAGOGASTRODUODENOSCOPY (EGD);  Surgeon: Hilarie Fredrickson, MD;  Location: [facility];  Service: Endoscopy;  Laterality: N/A;  . ESOPHAGOGASTRODUODENOSCOPY N/A 03/19/2014   Procedure: ESOPHAGOGASTRODUODENOSCOPY (EGD);  Surgeon: Rachael Fee, MD;  Location: [facility];  Service: Endoscopy;  Laterality: N/A;  . ESOPHAGOGASTRODUODENOSCOPY N/A 07/08/2015   Procedure: ESOPHAGOGASTRODUODENOSCOPY (EGD);  Surgeon: Sherrilyn Rist, MD;  Location: [facility] ENDOSCOPY;  Service: Endoscopy;  Laterality: N/A;  . ESOPHAGOGASTRODUODENOSCOPY (EGD) WITH PROPOFOL N/A 09/15/2015   Procedure: ESOPHAGOGASTRODUODENOSCOPY (EGD) WITH PROPOFOL;  Surgeon: Ruffin Frederick, MD;  Location: [facility] ENDOSCOPY;  Service: Gastroenterology;  Laterality: N/A;  . GASTROJEJUNOSTOMY     hx/notes 01/10/2015  . GASTROJEJUNOSTOMY N/A 09/23/2015   Procedure: OPEN GASTROJEJUNOSTOMY TUBE PLACEMENT;  Surgeon: De Blanch Kinsinger, MD;  Location: [facility] ORS;  Service: General;  Laterality: N/A;  . GLAUCOMA SURGERY Bilateral   . HERNIA REPAIR  2015  . IR GENERIC HISTORICAL  01/07/2016   IR GJ TUBE CHANGE 01/07/2016 Malachy Moan, MD [facility]  . IR GENERIC HISTORICAL  01/27/2016   IR MECH REMOV OBSTRUC MAT ANY COLON TUBE W/FLUORO 01/27/2016 Richarda Overlie, MD [facility]  . IR GENERIC HISTORICAL  02/07/2016   IR PATIENT EVAL TECH  0-60 MINS Darrell K Allred, PA-C [facility]  . IR GENERIC HISTORICAL  02/08/2016   IR GJ TUBE CHANGE 02/08/2016 Berdine Dance, MD [facility]  . IR GENERIC HISTORICAL  01/06/2016   IR GJ TUBE CHANGE 01/06/2016 [facility] OUT REF  . IR GENERIC HISTORICAL  05/02/2016   IR CM INJ ANY COLONIC TUBE W/FLUORO 05/02/2016 Oley Balm, MD [facility]  . LAPAROTOMY N/A 01/20/2015   Procedure: EXPLORATORY LAPAROTOMY;  Surgeon: Abigail Miyamoto,  MD;  Location: [facility] OR;  Service: General;  Laterality: N/A;  . LYSIS OF ADHESION N/A 01/20/2015   Procedure: LYSIS OF ADHESIONS < 1 HOUR;  Surgeon: Abigail Miyamoto, MD;  Location: [facility] OR;  Service: General;  Laterality: N/A;  . TONSILLECTOMY    . TUBAL LIGATION    . VENTRAL HERNIA REPAIR  2015   incarcerated ventral hernia ([facility] 09/2013)/notes 01/10/2015   HPI:  83 y.o. female with medical history significant of persistent atrial fibrillation on amiodarone, bradycardia, CKD stage III, hypothyroidism, non-obstructive coronary artery disease, type 2 diabetes, hypertension, chronic diastolic CHF, stroke, cerebrovascular disease, COPD, asthma, gastric outlet obstruction s/p prior J tube, anemia and hyponatremia, presented with shortness of breath and cough for worsening for about 2 weeks. Admitted with possible pneumonia, hypoxic respiratory failure. At baseline consumes liquid diet thickened to nectar consistency with supplemental nutrition via J tube. Seen by SLP during multiple prior admissions. Pt had a MBS on 11/09/15 indicating penetration of thin liquids to level of VFs which pt was able to sense and clear most of material. Also noted prominence at CP segment. Recommendation at that time was nectar thick liquids/ full liquid diet although there were pharyngeal residuals on this consistency. During most recent admission 03/2016, recommendations were thin liquids (full liquids), medications whole with liquid. Instrumental testing declined by patient at that time. Chest x-ray 05/11/16 showed Interval development of alveolar opacity in the left lung likely in the lingula which may reflect pneumonia or atelectasis, new small left pleural effusion.   Assessment / Plan / Recommendation Clinical Impression  Patient presents with moderate risk for aspiration given history of dysphagia, tube feedings, prior CVA and pneumonia. Patient reports she has been thickening some liquids at home, however she states she has been  consuming thin liquids during acute stay including water, jello, icies. She has a congested cough at baseline, however following trials of thin liquids via cup, she presents with immediate cough suggestive of reduced airway protection. Delayed coughing following trials of nectar-thickened liquids also noted; difficult to discern at bedside whether cough is 2/2 reduced airway protection or baseline cough. Spoke with patient regarding instrumental assessment to aid in determining safest, least restrictive diet and to identify compensatory techniques to improve swallow function. She is in agreement with this plan. Recommend nectar thick diet (clear liquids per MD) with medications whole with liquid pending MBS to be performed 2/11 or 2/12.    Aspiration Risk  Moderate aspiration risk    Diet Recommendation Nectar-thick liquid   Liquid Administration via: Cup Medication Administration: Whole meds with liquid Supervision: Patient able to self feed Compensations: Slow rate;Small sips/bites Postural Changes: Seated upright at 90 degrees    Other  Recommendations Oral Care Recommendations: Oral care QID Other Recommendations: Order thickener from pharmacy;Prohibited food (jello, ice cream, thin soups)   Follow up Recommendations Skilled Nursing facility      Frequency and Duration min 2x/week  2 weeks       Prognosis Prognosis for Safe Diet Advancement: Fair Barriers to  Reach Goals: Time post onset      Swallow Study   General Date of Onset: 05/08/16 HPI: 83 y.o. female with medical history significant of persistent atrial fibrillation on amiodarone, bradycardia, CKD stage III, hypothyroidism, non-obstructive coronary artery disease, type 2 diabetes, hypertension, chronic diastolic CHF, stroke, cerebrovascular disease, COPD, asthma, gastric outlet obstruction s/p prior J tube, anemia and hyponatremia, presented with shortness of breath and cough for worsening for about 2 weeks. Admitted with  possible pneumonia, hypoxic respiratory failure. At baseline consumes liquid diet thickened to nectar consistency with supplemental nutrition via J tube. Seen by SLP during multiple prior admissions. Pt had a MBS on 11/09/15 indicating penetration of thin liquids to level of VFs which pt was able to sense and clear most of material. Also noted prominence at CP segment. Recommendation at that time was nectar thick liquids/ full liquid diet although there were pharyngeal residuals on this consistency. During most recent admission 03/2016, recommendations were thin liquids (full liquids), medications whole with liquid. Instrumental testing declined by patient at that time. Chest x-ray 05/11/16 showed Interval development of alveolar opacity in the left lung likely in the lingula which may reflect pneumonia or atelectasis, new small left pleural effusion. Type of Study: Bedside Swallow Evaluation Previous Swallow Assessment: extensive history, see HPI Diet Prior to this Study: Thin liquids (Order is for thin, pt has been thickening herself) Temperature Spikes Noted: No Respiratory Status: Room air History of Recent Intubation: No Behavior/Cognition: Alert;Cooperative Oral Cavity Assessment: Within Functional Limits Oral Care Completed by SLP: No Oral Cavity - Dentition: Edentulous Vision: Functional for self-feeding Self-Feeding Abilities: Able to feed self Patient Positioning: Upright in bed Baseline Vocal Quality: Hoarse;Low vocal intensity Volitional Cough: Congested;Weak Volitional Swallow: Able to elicit    Oral/Motor/Sensory Function Overall Oral Motor/Sensory Function: Within functional limits   Ice Chips Ice chips: Not tested   Thin Liquid Thin Liquid: Impaired Presentation: Cup;Self Fed Pharyngeal  Phase Impairments: Throat Clearing - Immediate;Cough - Immediate    Nectar Thick Nectar Thick Liquid: Impaired Presentation: Cup Pharyngeal Phase Impairments: Cough - Delayed   Honey Thick  Honey Thick Liquid: Not tested   Puree Puree: Not tested   Solid   GO  Rondel Baton, [place] CF-SLP Speech-Language Pathologist [phone] Solid: Not tested        Arlana Lindau 05/13/2016,9:14 AM

## 2016-05-14 LAB — BASIC METABOLIC PANEL
Anion gap: 11 (ref 5–15)
Anion gap: 12 (ref 5–15)
BUN: 49 mg/dL — ABNORMAL HIGH (ref 6–20)
BUN: 51 mg/dL — ABNORMAL HIGH (ref 6–20)
CO2: 26 mmol/L (ref 22–32)
CO2: 27 mmol/L (ref 22–32)
Calcium: 9.3 mg/dL (ref 8.9–10.3)
Calcium: 9.5 mg/dL (ref 8.9–10.3)
Chloride: 93 mmol/L — ABNORMAL LOW (ref 101–111)
Chloride: 95 mmol/L — ABNORMAL LOW (ref 101–111)
Creatinine, Ser: 1.33 mg/dL — ABNORMAL HIGH (ref 0.44–1.00)
Creatinine, Ser: 1.33 mg/dL — ABNORMAL HIGH (ref 0.44–1.00)
GFR calc Af Amer: 39 mL/min — ABNORMAL LOW (ref >60)
GFR calc Af Amer: 39 mL/min — ABNORMAL LOW (ref >60)
GFR calc non Af Amer: 34 mL/min — ABNORMAL LOW (ref >60)
GFR calc non Af Amer: 34 mL/min — ABNORMAL LOW (ref >60)
Glucose, Bld: 142 mg/dL — ABNORMAL HIGH (ref 65–99)
Glucose, Bld: 144 mg/dL — ABNORMAL HIGH (ref 65–99)
Potassium: 3.8 mmol/L (ref 3.5–5.1)
Potassium: 3.8 mmol/L (ref 3.5–5.1)
Sodium: 132 mmol/L — ABNORMAL LOW (ref 135–145)
Sodium: 132 mmol/L — ABNORMAL LOW (ref 135–145)

## 2016-05-14 LAB — GLUCOSE, CAPILLARY
Glucose-Capillary: 151 mg/dL — ABNORMAL HIGH (ref 65–99)
Glucose-Capillary: 183 mg/dL — ABNORMAL HIGH (ref 65–99)
Glucose-Capillary: 147 mg/dL — ABNORMAL HIGH (ref 65–99)
Glucose-Capillary: 168 mg/dL — ABNORMAL HIGH (ref 65–99)

## 2016-05-14 LAB — PROTIME-INR
INR: 1.89
Prothrombin Time: 21.9 seconds — ABNORMAL HIGH (ref 11.4–15.2)

## 2016-05-14 MED ORDER — WARFARIN SODIUM 5 MG PO TABS
5.0000 mg | ORAL_TABLET | Freq: Once | ORAL | Status: AC
  Administered 2016-05-14: 5 mg via ORAL
  Filled 2016-05-14: qty 1

## 2016-05-14 NOTE — Progress Notes (Signed)
Progress Note  Patient Name: Shannon [name] Date of Encounter: 05/14/2016  Primary Cardiologist: Dr. Ena Dawley  Subjective   Feels perhaps a little bit better today. No chest pain.  Inpatient Medications    Scheduled Meds: . atorvastatin  10 mg Oral Daily  . azithromycin  500 mg Intravenous Q24H  . bacitracin-polymyxin b   Both Eyes BID  . bisoprolol  5 mg Oral Daily  . calcium-vitamin D  1 tablet Oral Q breakfast  . cefTRIAXone (ROCEPHIN)  IV  1 g Intravenous Q24H  . chlorhexidine  15 mL Mouth/Throat BID  . cycloSPORINE  1 drop Both Eyes BID  . diltiazem  120 mg Oral Daily  . feeding supplement (GLUCERNA SHAKE)  237 mL Oral q12n4p  . feeding supplement (PRO-STAT SUGAR FREE 64)  30 mL Per Tube Daily  . fluticasone  2 spray Each Nare Daily  . furosemide  40 mg Oral Daily  . gabapentin  300 mg Oral QHS  . hydrALAZINE  50 mg Oral TID  . insulin aspart  0-9 Units Subcutaneous TID WC  . ipratropium-albuterol  3 mL Nebulization TID  . isosorbide mononitrate  30 mg Oral Daily  . latanoprost  1 drop Both Eyes QHS  . levothyroxine  137 mcg Oral QAC breakfast  . loratadine  10 mg Oral Daily  . multivitamin with minerals  1 tablet Oral Daily  . pantoprazole  40 mg Oral BID  . polyvinyl alcohol  1 drop Both Eyes QID  . sertraline  100 mg Oral Daily  . warfarin  5 mg Oral ONCE-1800  . Warfarin - Pharmacist Dosing Inpatient   Does not apply q1800   Continuous Infusions: . feeding supplement (OSMOLITE 1.5 CAL) Stopped (05/13/16 0900)   PRN Meds: acetaminophen, albuterol, benzonatate, bisacodyl, HYDROcodone-acetaminophen, RESOURCE THICKENUP CLEAR, traMADol   Vital Signs    Vitals:   05/13/16 2251 05/14/16 0543 05/14/16 0830 05/14/16 1149  BP: (!) 131/59 (!) 110/56    Pulse: 84 84    Resp: 18 18    Temp:  97.8 F (36.6 C)    TempSrc:  Oral    SpO2: 100% 96% 94% 93%  Weight:  136 lb 6.4 oz (61.9 kg)    Height:        Intake/Output Summary (Last 24 hours) at 05/14/16  1212 Last data filed at 05/14/16 0915  Gross per 24 hour  Intake              700 ml  Output             1270 ml  Net             -570 ml   Filed Weights   05/12/16 0458 05/13/16 0423 05/14/16 0543  Weight: 136 lb 9.6 oz (62 kg) 138 lb (62.6 kg) 136 lb 6.4 oz (61.9 kg)    Telemetry    I personally reviewed telemetry which shows atrial flutter with variable conduction.  ECG    I personally reviewed the tracing from 05/08/2016 which showed atrial flutter with variable conduction.  Physical Exam   GEN: Frail elderly woman, no acute distress.  Neck: No JVD Cardiac: Irregularly irregular, soft systolic murmur, no gallop.  Respiratory: Clear to auscultation bilaterally. GI: Soft, non-distended, PEG tube. MS: No edema; kyphosis.  Labs    Chemistry  Recent Labs Lab 05/13/16 1521 05/13/16 2340 05/14/16 0619  NA 131* 132* 132*  K 4.0 3.8 3.8  CL 93* 93* 95*  CO2 27  27 26  GLUCOSE 93 144* 142*  BUN 48* 51* 49*  CREATININE 1.31* 1.33* 1.33*  CALCIUM 9.3 9.3 9.5  GFRNONAA 35* 34* 34*  GFRAA 40* 39* 39*  ANIONGAP 11 12 11      Hematology  Recent Labs Lab 05/08/16 1241 05/09/16 0953 05/12/16 0350  WBC 6.4 6.2 5.8  RBC 3.73* 3.71* 3.46*  HGB 10.2* 10.4* 9.7*  HCT 30.2* 30.3* 28.6*  MCV 81.0 81.7 82.7  MCH 27.3 28.0 28.0  MCHC 33.8 34.3 33.9  RDW 18.2* 18.7* 19.0*  PLT 301 287 284    Cardiac EnzymesNo results for input(s): TROPONINI in the last 168 hours.   Recent Labs Lab 05/08/16 1254  TROPIPOC 0.02     Radiology    No results found.  Cardiac Studies   Echocardiogram 05/09/2016: Study Conclusions  - Left ventricle: The cavity size was normal. Wall thickness was   normal. Systolic function was normal. The estimated ejection   fraction was in the range of 60% to 65%. Wall motion was normal;   there were no regional wall motion abnormalities. - Aortic valve: There was trivial regurgitation. - Left atrium: The atrium was mildly dilated. Volume/bsa,  ES,   (1-plane Simpson&'s, A2C): 45.6 ml/m^2. - Right atrium: The atrium was moderately dilated. - Tricuspid valve: There was moderate regurgitation. - Pulmonary arteries: Systolic pressure was mildly increased. PA   peak pressure: 31 mm Hg (S).  Impressions:  - No cardiac source of emboli was indentified.  Patient Profile     83 y.o. female with persistent atrial flutter, CKD stage III, hypothyroidism, nonobstructive CAD, previous stroke, COPD, gastric outlet obstruction status post PEG placement, and anemia. She is currently admitted with cough and shortness of breath, being treated for pneumonia and volume overload.  Assessment & Plan    1. Persistent atrial flutter with variable conduction. She has been taken off of amiodarone, currently on heart rate control strategy with bisoprolol and Cardizem CD. Coumadin is being adjusted by pharmacy, INR up to 1.89.  2. Acute on chronic diastolic heart failure, recent echocardiogram noted above. Lasix now at 40 mg oral daily. Intake and output fairly even.  3. Acute on chronic renal insufficiency, creatinine 1.8.  Current cardiac regimen includes bisoprolol, Cardizem CD, Lasix 40 mg daily, Lipitor, hydralazine, and Imdur. Heart rate control of atrial flutter is reasonable. Had approximately 800 cc out more than in on current diuretic regimen and renal function is stable. No changes were made today.   Signed, Rozann Lesches, MD  05/14/2016, 12:12 PM

## 2016-05-14 NOTE — Progress Notes (Signed)
Patient with no complaints or concerns during 7pm - 7am shift. Alert and oriented, slept during the night shift. Feeding tube stopped on day shift due to tube being clogged.   Eriq Hufford, RN

## 2016-05-14 NOTE — Progress Notes (Signed)
PROGRESS NOTE  Shannon [name] XBM:[id number] DOB: 03-Dec-1923 DOA: 05/08/2016 PCP: Kirt Boys, DO   LOS: 6 days   Brief Narrative: 83 y.o.femalewith medical history significant of persistent atrial fibrillation on amiodarone, bradycardia, CKD stage III,hypothyroidism,non-obstructive coronary artery disease, type 2 diabetes, hypertension, chronic diastolic CHF, stroke, cerebrovascular disease, COPD, asthma, gastric outlet obstruction s/p prior J tube, anemia and hyponatremia, presented with shortness of breath and cough for worsening for about 2 weeks.  Assessment & Plan: Active Problems:   Persistent atrial fibrillation (HCC)   Hypothyroidism   CAP (community acquired pneumonia)   Chronic kidney insufficiency, stage 3 (moderate)   Hyponatremia   Diabetic polyneuropathy associated with type 2 diabetes mellitus (HCC)   Acute on chronic diastolic CHF (congestive heart failure), NYHA class 4 (HCC)   PEG (percutaneous endoscopic gastrostomy) status (HCC)   HCAP (healthcare-associated pneumonia)   Possible community acquired pneumonia - Patient placed on ceftriaxone and azithromycin, continue, improving, today day 7/7, d/c Abx tomorrow am - Respiratory virus panel was negative - Continue supportive care - dyspnea improved  Hypoxic respiratory failure - wean off oxygen as tolerated, now resolved.  - she remains on room air this morning - Amiodarone discontinued by cardiology due to concern for pulmonary toxicity  H/o gastric outlet obstruction - With PEG-J tube, declogged by IR 2/7, now clogged again. Have asked IR to re-evaluate  Acute on chronic diastolic congestive heart failure - Patient was initially on IV Lasix, this was converted to by mouth on 2/6, she lost ~ 6 lbs (141>135) and was converted to po, however she remained persistently dyspneic and cardiology was consulted on 2/8, and IV Lasix was resumed 2/8 - 2-D echo done on 05/09/16 and showed ejection fraction of 60-65% -  Patient has no chest pain, troponin not elevated. - Continue Imdur, hydralazine - Renal function has remained stable  Persistent atrial fibrillation / flutter - Continue diltiazem, Coumadin per pharmacy - Amiodarone discontinued as above  Acute on chronic hyponatremia likely in the setting of CHF - Serum sodium has remained stable at 128 >> 127 >> 127 >> 131>>132>>132  Chronic kidney disease is stage III  - Monitor BMP and electrolytes. Repeat lab in the morning. - Slight creatinine stable ~1.3 today  Hypothyroidism  - Continue Synthroid - TSH 7.4, will need to be rechecked as an outpatient in ~ 3 weeks  Left leg pain, chronic in nature - Ultrasound negative for DVT  Goals of care discussion - DNR, thus with daughter at bedside today regarding palliative, she has seen within the past but is not too keen on discussing with them again. I brought up the idea of hospice however she seems reluctant   DVT prophylaxis: Coumadin Code Status: DNR Family Communication: daughter bedside Disposition Plan: d/c 1-2 days  Consultants:   Cardiology  Procedures:   None   Antimicrobials:  Ceftriaxone and azithromycin 2/5 >>  Subjective: - eating breakfast. More energy today  Objective: Vitals:   05/13/16 2020 05/13/16 2251 05/14/16 0543 05/14/16 0830  BP: 133/66 (!) 131/59 (!) 110/56   Pulse: 81 84 84   Resp: 18 18 18    Temp: 97.7 F (36.5 C)  97.8 F (36.6 C)   TempSrc: Oral  Oral   SpO2: 95% 100% 96% 94%  Weight:   61.9 kg (136 lb 6.4 oz)   Height:        Intake/Output Summary (Last 24 hours) at 05/14/16 1015 Last data filed at 05/14/16 0915  Gross per 24 hour  Intake  700 ml  Output             1270 ml  Net             -570 ml   Filed Weights   05/12/16 0458 05/13/16 0423 05/14/16 0543  Weight: 62 kg (136 lb 9.6 oz) 62.6 kg (138 lb) 61.9 kg (136 lb 6.4 oz)    Examination: Constitutional: NAD Vitals:   05/13/16 2020 05/13/16 2251 05/14/16  0543 05/14/16 0830  BP: 133/66 (!) 131/59 (!) 110/56   Pulse: 81 84 84   Resp: 18 18 18    Temp: 97.7 F (36.5 C)  97.8 F (36.6 C)   TempSrc: Oral  Oral   SpO2: 95% 100% 96% 94%  Weight:   61.9 kg (136 lb 6.4 oz)   Height:       ENMT: Mucous membranes are moist.  Neck: normal, supple, no masses Respiratory: clear to auscultation bilaterally, no wheezing, no crackles.  Cardiovascular: Regular rate and rhythm, no murmurs / rubs / gallops. Trace LE edema. 2+ pedal pulses.  Abdomen: no tenderness. Bowel sounds positive.  Neurologic: non focal    Data Reviewed: I have personally reviewed following labs and imaging studies  CBC:  Recent Labs Lab 05/08/16 1241 05/09/16 0953 05/12/16 0350  WBC 6.4 6.2 5.8  HGB 10.2* 10.4* 9.7*  HCT 30.2* 30.3* 28.6*  MCV 81.0 81.7 82.7  PLT 301 287 284   Basic Metabolic Panel:  Recent Labs Lab 05/11/16 0545 05/12/16 0350 05/13/16 1521 05/13/16 2340 05/14/16 0619  NA 127* 127* 131* 132* 132*  K 3.7 3.6 4.0 3.8 3.8  CL 88* 89* 93* 93* 95*  CO2 26 25 27 27 26   GLUCOSE 130* 209* 93 144* 142*  BUN 47* 56* 48* 51* 49*  CREATININE 1.59* 1.82* 1.31* 1.33* 1.33*  CALCIUM 9.5 9.0 9.3 9.3 9.5   Coagulation Profile:  Recent Labs Lab 05/10/16 0515 05/11/16 0545 05/12/16 0350 05/13/16 0433 05/14/16 0619  INR 1.41 1.36 1.47 1.73 1.89   BNP (last 3 results)  Recent Labs  05/08/16 1053  PROBNP 1,132*   CBG:  Recent Labs Lab 05/13/16 0748 05/13/16 1132 05/13/16 1635 05/13/16 2223 05/14/16 0752  GLUCAP 191* 158* 131* 199* 151*   Thyroid Function Tests: No results for input(s): TSH, T4TOTAL, FREET4, T3FREE, THYROIDAB in the last 72 hours. Urine analysis:    Component Value Date/Time   COLORURINE YELLOW 05/10/2016 1539   APPEARANCEUR CLEAR 05/10/2016 1539   LABSPEC 1.009 05/10/2016 1539   PHURINE 7.0 05/10/2016 1539   GLUCOSEU NEGATIVE 05/10/2016 1539   HGBUR NEGATIVE 05/10/2016 1539   BILIRUBINUR NEGATIVE 05/10/2016  1539   KETONESUR NEGATIVE 05/10/2016 1539   PROTEINUR NEGATIVE 05/10/2016 1539   UROBILINOGEN 0.2 01/27/2015 2308   NITRITE NEGATIVE 05/10/2016 1539   LEUKOCYTESUR NEGATIVE 05/10/2016 1539   Sepsis Labs: Invalid input(s): PROCALCITONIN, LACTICIDVEN  Recent Results (from the past 240 hour(s))  Blood Culture (routine x 2)     Status: None   Collection Time: 05/08/16  4:30 PM  Result Value Ref Range Status   Specimen Description BLOOD LEFT FOREARM  Final   Special Requests IN PEDIATRIC BOTTLE 1CC  Final   Culture NO GROWTH 5 DAYS  Final   Report Status 05/13/2016 FINAL  Final  Blood Culture (routine x 2)     Status: None   Collection Time: 05/08/16  4:35 PM  Result Value Ref Range Status   Specimen Description BLOOD RIGHT HAND  Final   Special  Requests BOTTLES DRAWN AEROBIC AND ANAEROBIC 5CC  Final   Culture NO GROWTH 5 DAYS  Final   Report Status 05/13/2016 FINAL  Final  MRSA PCR Screening     Status: None   Collection Time: 05/08/16  7:32 PM  Result Value Ref Range Status   MRSA by PCR NEGATIVE NEGATIVE Final    Comment:        The GeneXpert MRSA Assay (FDA approved for NASAL specimens only), is one component of a comprehensive MRSA colonization surveillance program. It is not intended to diagnose MRSA infection nor to guide or monitor treatment for MRSA infections.   Respiratory Panel by PCR     Status: None   Collection Time: 05/08/16  7:35 PM  Result Value Ref Range Status   Adenovirus NOT DETECTED NOT DETECTED Final   Coronavirus 229E NOT DETECTED NOT DETECTED Final   Coronavirus HKU1 NOT DETECTED NOT DETECTED Final   Coronavirus NL63 NOT DETECTED NOT DETECTED Final   Coronavirus OC43 NOT DETECTED NOT DETECTED Final   Metapneumovirus NOT DETECTED NOT DETECTED Final   Rhinovirus / Enterovirus NOT DETECTED NOT DETECTED Final   Influenza A NOT DETECTED NOT DETECTED Final   Influenza B NOT DETECTED NOT DETECTED Final   Parainfluenza Virus 1 NOT DETECTED NOT DETECTED  Final   Parainfluenza Virus 2 NOT DETECTED NOT DETECTED Final   Parainfluenza Virus 3 NOT DETECTED NOT DETECTED Final   Parainfluenza Virus 4 NOT DETECTED NOT DETECTED Final   Respiratory Syncytial Virus NOT DETECTED NOT DETECTED Final   Bordetella pertussis NOT DETECTED NOT DETECTED Final   Chlamydophila pneumoniae NOT DETECTED NOT DETECTED Final   Mycoplasma pneumoniae NOT DETECTED NOT DETECTED Final  Culture, sputum-assessment     Status: None   Collection Time: 05/09/16  4:01 AM  Result Value Ref Range Status   Specimen Description EXPECTORATED SPUTUM  Final   Special Requests NONE  Final   Sputum evaluation   Final    Sputum specimen not acceptable for testing.  Please recollect.   Gram Stain Report Called to,Read Back By and Verified With: R CULBREN,RN @0710  05/09/16 MKELLY,MLT   Report Status 05/09/2016 FINAL  Final      Radiology Studies: No results found.   Scheduled Meds: . atorvastatin  10 mg Oral Daily  . azithromycin  500 mg Intravenous Q24H  . bacitracin-polymyxin b   Both Eyes BID  . bisoprolol  5 mg Oral Daily  . calcium-vitamin D  1 tablet Oral Q breakfast  . cefTRIAXone (ROCEPHIN)  IV  1 g Intravenous Q24H  . chlorhexidine  15 mL Mouth/Throat BID  . cycloSPORINE  1 drop Both Eyes BID  . diltiazem  120 mg Oral Daily  . feeding supplement (GLUCERNA SHAKE)  237 mL Oral q12n4p  . feeding supplement (PRO-STAT SUGAR FREE 64)  30 mL Per Tube Daily  . fluticasone  2 spray Each Nare Daily  . furosemide  40 mg Oral Daily  . gabapentin  300 mg Oral QHS  . hydrALAZINE  50 mg Oral TID  . insulin aspart  0-9 Units Subcutaneous TID WC  . ipratropium-albuterol  3 mL Nebulization TID  . isosorbide mononitrate  30 mg Oral Daily  . latanoprost  1 drop Both Eyes QHS  . levothyroxine  137 mcg Oral QAC breakfast  . loratadine  10 mg Oral Daily  . multivitamin with minerals  1 tablet Oral Daily  . pantoprazole  40 mg Oral BID  . polyvinyl alcohol  1 drop  Both Eyes QID  .  sertraline  100 mg Oral Daily  . warfarin  5 mg Oral ONCE-1800  . Warfarin - Pharmacist Dosing Inpatient   Does not apply q1800   Continuous Infusions: . feeding supplement (OSMOLITE 1.5 CAL) Stopped (05/13/16 0900)   Pamella Pert, MD, PhD [facility] Pager [phone]  If 7PM-7AM, please contact night-coverage [email or web address] Password [facility] 05/14/2016, 10:15 AM

## 2016-05-14 NOTE — Progress Notes (Signed)
[facility] for warfarin Indication: atrial fibrillation  Allergies  Allergen Reactions  . Penicillins Itching and Rash    Has patient had a PCN reaction causing immediate rash, facial/tongue/throat swelling, SOB or lightheadedness with hypotension: Yes Has patient had a PCN reaction causing severe rash involving mucus membranes or skin necrosis: Unk Has patient had a PCN reaction that required hospitalization: Unk Has patient had a PCN reaction occurring within the last 10 years: Unk If all of the above answers are "NO", then may proceed with Cephalosporin use.     Patient Measurements: Height: 5\' 6"  (167.6 cm) Weight: 136 lb 6.4 oz (61.9 kg) IBW/kg (Calculated) : 59.3 Heparin Dosing Weight: n/a  Vital Signs: Temp: 97.8 F (36.6 C) (02/11 0543) Temp Source: Oral (02/11 0543) BP: 110/56 (02/11 0543) Pulse Rate: 84 (02/11 0543)  Labs:  Recent Labs  05/12/16 0350 05/13/16 0433 05/13/16 1521 05/13/16 2340 05/14/16 0619  HGB 9.7*  --   --   --   --   HCT 28.6*  --   --   --   --   PLT 284  --   --   --   --   LABPROT 17.9* 20.4*  --   --  21.9*  INR 1.47 1.73  --   --  1.89  CREATININE 1.82*  --  1.31* 1.33* 1.33*    Estimated Creatinine Clearance: 26.3 mL/min (by C-G formula based on SCr of 1.33 mg/dL (H)).   Assessment: 60 yoF on warfarin PTA for atrial fibrillation to resume per pharmacy on admit. She did not received warfarin doses on 2/5 and 2/6.   INR remains subtherapeutic at 1.89 but is slowly trending up after boost doses. Patient is supposed to be receiving tube feeds but tube is currently clogged. Per RN she is taking pills okay. Serum creatinine trending up and was 1.82 yesterday. Amiodarone was stopped 2/8. Antibiotics will be ending on 2/12.   PTA warfarin dose = 3.75mg  daily except 2.5mg  on Mondays, may require higher doses in future with amiodarone stopped.  Goal of Therapy:  INR 2-3 Monitor platelets by  anticoagulation protocol: Yes   Plan:  -Warfarin 5 mg po again tonight -Monitor INR, S/Sx bleeding daily -Monitor tube feeds/intake and drug interactions  Demetrius Charity, PharmD Acute Care Pharmacy Resident  Pager: [phone] 05/14/2016

## 2016-05-15 ENCOUNTER — Encounter (HOSPITAL_COMMUNITY): Payer: Self-pay | Admitting: Radiology

## 2016-05-15 ENCOUNTER — Inpatient Hospital Stay (HOSPITAL_COMMUNITY): Payer: Medicare Other

## 2016-05-15 HISTORY — PX: IR GENERIC HISTORICAL: IMG1180011

## 2016-05-15 LAB — BASIC METABOLIC PANEL
Anion gap: 12 (ref 5–15)
BUN: 52 mg/dL — ABNORMAL HIGH (ref 6–20)
CO2: 25 mmol/L (ref 22–32)
Calcium: 9.3 mg/dL (ref 8.9–10.3)
Chloride: 95 mmol/L — ABNORMAL LOW (ref 101–111)
Creatinine, Ser: 1.5 mg/dL — ABNORMAL HIGH (ref 0.44–1.00)
GFR calc Af Amer: 34 mL/min — ABNORMAL LOW (ref >60)
GFR calc non Af Amer: 29 mL/min — ABNORMAL LOW (ref >60)
Glucose, Bld: 213 mg/dL — ABNORMAL HIGH (ref 65–99)
Potassium: 3.8 mmol/L (ref 3.5–5.1)
Sodium: 132 mmol/L — ABNORMAL LOW (ref 135–145)

## 2016-05-15 LAB — GLUCOSE, CAPILLARY
Glucose-Capillary: 150 mg/dL — ABNORMAL HIGH (ref 65–99)
Glucose-Capillary: 205 mg/dL — ABNORMAL HIGH (ref 65–99)
Glucose-Capillary: 81 mg/dL (ref 65–99)
Glucose-Capillary: 170 mg/dL — ABNORMAL HIGH (ref 65–99)

## 2016-05-15 LAB — PROTIME-INR
INR: 2.17
Prothrombin Time: 24.6 seconds — ABNORMAL HIGH (ref 11.4–15.2)

## 2016-05-15 MED ORDER — ENSURE ENLIVE PO LIQD
237.0000 mL | ORAL | Status: DC
  Administered 2016-05-16: 237 mL via ORAL

## 2016-05-15 MED ORDER — LIDOCAINE VISCOUS 2 % MT SOLN
OROMUCOSAL | Status: DC | PRN
  Administered 2016-05-15: 15 mL via OROMUCOSAL

## 2016-05-15 MED ORDER — LIDOCAINE VISCOUS 2 % MT SOLN
OROMUCOSAL | Status: AC
  Filled 2016-05-15: qty 15

## 2016-05-15 MED ORDER — WARFARIN SODIUM 5 MG PO TABS
5.0000 mg | ORAL_TABLET | Freq: Once | ORAL | Status: AC
  Administered 2016-05-15: 5 mg via ORAL
  Filled 2016-05-15: qty 1

## 2016-05-15 MED ORDER — IOPAMIDOL (ISOVUE-300) INJECTION 61%
INTRAVENOUS | Status: AC
  Administered 2016-05-15: 25 mL
  Filled 2016-05-15: qty 50

## 2016-05-15 NOTE — Progress Notes (Signed)
Progress Note  Patient Name: Shannon [name] Date of Encounter: 05/15/2016  Primary Cardiologist: Dr. Tobias Alexander  Subjective   Feels perhaps a little bit better today. No chest pain. Had feeding tube replaced today  Inpatient Medications    Scheduled Meds: . atorvastatin  10 mg Oral Daily  . azithromycin  500 mg Intravenous Q24H  . bacitracin-polymyxin b   Both Eyes BID  . bisoprolol  5 mg Oral Daily  . calcium-vitamin D  1 tablet Oral Q breakfast  . cefTRIAXone (ROCEPHIN)  IV  1 g Intravenous Q24H  . chlorhexidine  15 mL Mouth/Throat BID  . cycloSPORINE  1 drop Both Eyes BID  . diltiazem  120 mg Oral Daily  . feeding supplement (GLUCERNA SHAKE)  237 mL Oral q12n4p  . feeding supplement (PRO-STAT SUGAR FREE 64)  30 mL Per Tube Daily  . fluticasone  2 spray Each Nare Daily  . furosemide  40 mg Oral Daily  . gabapentin  300 mg Oral QHS  . hydrALAZINE  50 mg Oral TID  . insulin aspart  0-9 Units Subcutaneous TID WC  . ipratropium-albuterol  3 mL Nebulization TID  . isosorbide mononitrate  30 mg Oral Daily  . latanoprost  1 drop Both Eyes QHS  . levothyroxine  137 mcg Oral QAC breakfast  . lidocaine      . loratadine  10 mg Oral Daily  . multivitamin with minerals  1 tablet Oral Daily  . pantoprazole  40 mg Oral BID  . polyvinyl alcohol  1 drop Both Eyes QID  . sertraline  100 mg Oral Daily  . warfarin  5 mg Oral ONCE-1800  . Warfarin - Pharmacist Dosing Inpatient   Does not apply q1800   Continuous Infusions: . feeding supplement (OSMOLITE 1.5 CAL) Stopped (05/13/16 0900)   PRN Meds: acetaminophen, albuterol, benzonatate, bisacodyl, HYDROcodone-acetaminophen, lidocaine, RESOURCE THICKENUP CLEAR, traMADol   Vital Signs    Vitals:   05/14/16 2023 05/15/16 0702 05/15/16 0804 05/15/16 1109  BP: 107/66 (!) 143/58  (!) 138/59  Pulse: (!) 101 65  66  Resp: 17   18  Temp: 98.2 F (36.8 C) 98 F (36.7 C)  98.3 F (36.8 C)  TempSrc: Oral Oral    SpO2: 95% 93% 95%  92%  Weight:  137 lb (62.1 kg)    Height:        Intake/Output Summary (Last 24 hours) at 05/15/16 1523 Last data filed at 05/15/16 1139  Gross per 24 hour  Intake              120 ml  Output              450 ml  Net             -330 ml   Filed Weights   05/13/16 0423 05/14/16 0543 05/15/16 0702  Weight: 138 lb (62.6 kg) 136 lb 6.4 oz (61.9 kg) 137 lb (62.1 kg)    Telemetry    I personally reviewed telemetry which shows NSR. She converted from atrial flutter at 2:48 this am  ECG    I personally reviewed the tracing from 05/08/2016 which showed atrial flutter with variable conduction.  Physical Exam   GEN: Frail elderly woman, no acute distress.  Neck: No JVD Cardiac: regularly regular, soft systolic murmur, no gallop.  Respiratory: Clear to auscultation bilaterally. GI: Soft, non-distended, PEG tube. MS: No edema; kyphosis.  Labs    Chemistry  Recent Labs Lab 05/13/16 2340  05/14/16 0619 05/15/16 1027  NA 132* 132* 132*  K 3.8 3.8 3.8  CL 93* 95* 95*  CO2 27 26 25   GLUCOSE 144* 142* 213*  BUN 51* 49* 52*  CREATININE 1.33* 1.33* 1.50*  CALCIUM 9.3 9.5 9.3  GFRNONAA 34* 34* 29*  GFRAA 39* 39* 34*  ANIONGAP 12 11 12      Hematology  Recent Labs Lab 05/09/16 0953 05/12/16 0350  WBC 6.2 5.8  RBC 3.71* 3.46*  HGB 10.4* 9.7*  HCT 30.3* 28.6*  MCV 81.7 82.7  MCH 28.0 28.0  MCHC 34.3 33.9  RDW 18.7* 19.0*  PLT 287 284    Cardiac EnzymesNo results for input(s): TROPONINI in the last 168 hours.  No results for input(s): TROPIPOC in the last 168 hours.   Radiology    Ir Gj Tube Change  Result Date: 05/15/2016 Please refer to "Notes" to see consult details.  Dg Swallowing Func-speech Pathology  Result Date: 05/15/2016 Objective Swallowing Evaluation: Type of Study: MBS-Modified Barium Swallow Study Patient Details Name: Shannon [name] MRN: [id number] Date of Birth: [birth year] Today's Date: 05/15/2016 Time: SLP Start Time (ACUTE ONLY): 0955-SLP Stop Time  (ACUTE ONLY): 1014 SLP Time Calculation (min) (ACUTE ONLY): 19 min Past Medical History: Past Medical History: Diagnosis Date . Anemia   previous blood transfusions . Arthritis   "all over" . Asthma  . Bradycardia   requiring previous d/c of BB and reduction of amiodarone . CAD (coronary artery disease)   nonobstructive per notes . Chronic diastolic CHF (congestive heart failure) (HCC)  . CKD (chronic kidney disease), stage III  . Complication of blood transfusion   "got the wrong blood type at [facility] in ~ 2015; no adverse reaction that we are aware of"/daughter, [name] (01/27/2016) . COPD (chronic obstructive pulmonary disease) (HCC)  . Depression   "light case" . DVT (deep venous thrombosis) (HCC) 01/2016  a. LLE DVT 01/2016 - switched from Eliquis to Coumadin. . Gastric stenosis   a. s/p stomach tube . GERD (gastroesophageal reflux disease)  . History of blood transfusion   "several" (01/27/2016) . History of stomach ulcers  . Hyperlipidemia  . Hypertension  . Hypothyroidism  . Paraesophageal hernia  . Perforated gastric ulcer (HCC)  . Seasonal allergies  . SIADH (syndrome of inappropriate ADH production) (HCC)   [name] 01/10/2015 . Small bowel obstruction   "I don't know how many" (01/11/2015) . Stroke (HCC)   "light one" . Type II diabetes mellitus (HCC)   "related to prednisone use  for > 20 yr; once predinose stopped; no more DM RX" (01/27/2016) . Ventral hernia with bowel obstruction  Past Surgical History: Past Surgical History: Procedure Laterality Date . CATARACT EXTRACTION W/ INTRAOCULAR LENS  IMPLANT, BILATERAL   . CHOLECYSTECTOMY OPEN   . COLECTOMY   . ESOPHAGOGASTRODUODENOSCOPY N/A 01/19/2014  Procedure: ESOPHAGOGASTRODUODENOSCOPY (EGD);  Surgeon: Hilarie Fredrickson, MD;  Location: [facility];  Service: Endoscopy;  Laterality: N/A; . ESOPHAGOGASTRODUODENOSCOPY N/A 01/20/2014  Procedure: ESOPHAGOGASTRODUODENOSCOPY (EGD);  Surgeon: Hilarie Fredrickson, MD;  Location: [facility];  Service: Endoscopy;   Laterality: N/A; . ESOPHAGOGASTRODUODENOSCOPY N/A 03/19/2014  Procedure: ESOPHAGOGASTRODUODENOSCOPY (EGD);  Surgeon: Rachael Fee, MD;  Location: [facility];  Service: Endoscopy;  Laterality: N/A; . ESOPHAGOGASTRODUODENOSCOPY N/A 07/08/2015  Procedure: ESOPHAGOGASTRODUODENOSCOPY (EGD);  Surgeon: Sherrilyn Rist, MD;  Location: [facility] ENDOSCOPY;  Service: Endoscopy;  Laterality: N/A; . ESOPHAGOGASTRODUODENOSCOPY (EGD) WITH PROPOFOL N/A 09/15/2015  Procedure: ESOPHAGOGASTRODUODENOSCOPY (EGD) WITH PROPOFOL;  Surgeon: Ruffin Frederick, MD;  Location: [facility];  Service: Gastroenterology;  Laterality: N/A; . GASTROJEJUNOSTOMY    hx/notes 01/10/2015 . GASTROJEJUNOSTOMY N/A 09/23/2015  Procedure: OPEN GASTROJEJUNOSTOMY TUBE PLACEMENT;  Surgeon: De Blanch Kinsinger, MD;  Location: [facility] ORS;  Service: General;  Laterality: N/A; . GLAUCOMA SURGERY Bilateral  . HERNIA REPAIR  2015 . IR GENERIC HISTORICAL  01/07/2016  IR GJ TUBE CHANGE 01/07/2016 Malachy Moan, MD [facility] . IR GENERIC HISTORICAL  01/27/2016  IR MECH REMOV OBSTRUC MAT ANY COLON TUBE W/FLUORO 01/27/2016 Richarda Overlie, MD [facility] . IR GENERIC HISTORICAL  02/07/2016  IR PATIENT EVAL TECH 0-60 MINS Darrell K Allred, PA-C [facility] . IR GENERIC HISTORICAL  02/08/2016  IR GJ TUBE CHANGE 02/08/2016 Berdine Dance, MD [facility] . IR GENERIC HISTORICAL  01/06/2016  IR GJ TUBE CHANGE 01/06/2016 [facility] OUT REF . IR GENERIC HISTORICAL  05/02/2016  IR CM INJ ANY COLONIC TUBE W/FLUORO 05/02/2016 Oley Balm, MD [facility] . LAPAROTOMY N/A 01/20/2015  Procedure: EXPLORATORY LAPAROTOMY;  Surgeon: Abigail Miyamoto, MD;  Location: [facility];  Service: General;  Laterality: N/A; . LYSIS OF ADHESION N/A 01/20/2015  Procedure: LYSIS OF ADHESIONS < 1 HOUR;  Surgeon: Abigail Miyamoto, MD;  Location: [facility] OR;  Service: General;  Laterality: N/A; . TONSILLECTOMY   . TUBAL LIGATION   . VENTRAL HERNIA REPAIR  2015  incarcerated ventral hernia ([facility] 09/2013)/notes 01/10/2015  HPI: 83 y.o. female with medical history significant of persistent atrial fibrillation on amiodarone, bradycardia, CKD stage III, hypothyroidism, non-obstructive coronary artery disease, type 2 diabetes, hypertension, chronic diastolic CHF, stroke, cerebrovascular disease, COPD, asthma, gastric outlet obstruction s/p prior J tube, anemia and hyponatremia, presented with shortness of breath and cough for worsening for about 2 weeks. Admitted with possible pneumonia, hypoxic respiratory failure. At baseline consumes liquid diet thickened to nectar consistency with supplemental nutrition via J tube. Seen by SLP during multiple prior admissions. Pt had a MBS on 11/09/15 indicating penetration of thin liquids to level of VFs which pt was able to sense and clear most of material. Also noted prominence at CP segment. Recommendation at that time was nectar thick liquids/ full liquid diet although there were pharyngeal residuals on this consistency. During most recent admission 03/2016, recommendations were thin liquids (full liquids), medications whole with liquid. Instrumental testing declined by patient at that time. Chest x-ray 05/11/16 showed Interval development of alveolar opacity in the left lung likely in the lingula which may reflect pneumonia or atelectasis, new small left pleural effusion. Subjective: Pt semi-upright in bed, consuming clear liquids from tray which she thickens herself to nectar consistency Assessment / Plan / Recommendation [facility] IP CLINICAL IMPRESSIONS 05/15/2016 Therapy Diagnosis Mild oral phase dysphagia;Mild pharyngeal phase dysphagia Clinical Impression Patient presents with mild oropharyngeal dysphagia characterized by delayed swallow initiation resulting in silent aspiration of thin liquids. Additionally, patient with mild vallecular residuals post swallow due to base of tongue weakness leading to one episode of silent aspiration of residuals post swallow. Use of chin tuck did prevent  penetration/aspiration of thin liquids during todays study. Would consider upgrade to thin liquids once patient can complete chin tuck consistently. Note that long h/o esophageal dysphagia does also increase aspiration risk. Will continue to f/u.  Impact on safety and function Moderate aspiration risk   [facility] IP TREATMENT RECOMMENDATION 05/15/2016 Treatment Recommendations Therapy as outlined in treatment plan below   Prognosis 05/15/2016 Prognosis for Safe Diet Advancement Good Barriers to Reach Goals Time post onset Barriers/Prognosis Comment -- [facility] IP DIET RECOMMENDATION 05/15/2016 SLP Diet Recommendations Nectar thick  liquid Liquid Administration via Cup;No straw Medication Administration Crushed with puree Compensations Slow rate;Small sips/bites Postural Changes Seated upright at 90 degrees;Remain semi-upright after after feeds/meals (Comment)   [facility] IP OTHER RECOMMENDATIONS 05/15/2016 Recommended Consults -- Oral Care Recommendations Oral care BID Other Recommendations Order thickener from pharmacy;Prohibited food (jello, ice cream, thin soups)   [facility] IP FOLLOW UP RECOMMENDATIONS 05/15/2016 Follow up Recommendations Skilled Nursing facility   [facility] IP FREQUENCY AND DURATION 05/15/2016 Speech Therapy Frequency (ACUTE ONLY) min 2x/week Treatment Duration 2 weeks      [facility] IP ORAL PHASE 05/15/2016 Oral Phase WFL Oral - Pudding Teaspoon -- Oral - Pudding Cup -- Oral - Honey Teaspoon -- Oral - Honey Cup -- Oral - Nectar Teaspoon -- Oral - Nectar Cup -- Oral - Nectar Straw -- Oral - Thin Teaspoon -- Oral - Thin Cup -- Oral - Thin Straw -- Oral - Puree -- Oral - Mech Soft -- Oral - Regular -- Oral - Multi-Consistency -- Oral - Pill -- Oral Phase - Comment --  [facility] IP PHARYNGEAL PHASE 05/15/2016 Pharyngeal Phase Impaired Pharyngeal- Pudding Teaspoon -- Pharyngeal -- Pharyngeal- Pudding Cup -- Pharyngeal -- Pharyngeal- Honey Teaspoon -- Pharyngeal -- Pharyngeal- Honey Cup -- Pharyngeal -- Pharyngeal- Nectar Teaspoon Delayed swallow  initiation-vallecula;Reduced tongue base retraction;Pharyngeal residue - valleculae Pharyngeal -- Pharyngeal- Nectar Cup Delayed swallow initiation-vallecula;Reduced tongue base retraction;Pharyngeal residue - valleculae Pharyngeal -- Pharyngeal- Nectar Straw -- Pharyngeal -- Pharyngeal- Thin Teaspoon Delayed swallow initiation-vallecula;Reduced tongue base retraction;Pharyngeal residue - valleculae Pharyngeal -- Pharyngeal- Thin Cup Delayed swallow initiation-vallecula;Reduced tongue base retraction;Pharyngeal residue - valleculae;Delayed swallow initiation-pyriform sinuses;Penetration/Aspiration before swallow;Penetration/Apiration after swallow Pharyngeal Material enters airway, passes BELOW cords without attempt by patient to eject out (silent aspiration) Pharyngeal- Thin Straw Reduced tongue base retraction;Pharyngeal residue - valleculae;Delayed swallow initiation-pyriform sinuses;Delayed swallow initiation-vallecula;Penetration/Aspiration before swallow Pharyngeal Material enters airway, passes BELOW cords without attempt by patient to eject out (silent aspiration) Pharyngeal- Puree Delayed swallow initiation-vallecula;Reduced tongue base retraction;Pharyngeal residue - valleculae Pharyngeal -- Pharyngeal- Mechanical Soft -- Pharyngeal -- Pharyngeal- Regular -- Pharyngeal -- Pharyngeal- Multi-consistency -- Pharyngeal -- Pharyngeal- Pill Pharyngeal residue - valleculae;Reduced tongue base retraction Pharyngeal -- Pharyngeal Comment --  [facility] IP CERVICAL ESOPHAGEAL PHASE 05/15/2016 Cervical Esophageal Phase Impaired Pudding Teaspoon -- Pudding Cup -- Honey Teaspoon -- Honey Cup -- Nectar Teaspoon -- Nectar Cup -- Nectar Straw -- Thin Teaspoon -- Thin Cup -- Thin Straw -- Puree -- Mechanical Soft -- Regular -- Multi-consistency -- Pill -- Cervical Esophageal Comment -- [facility] IP GO 11/09/2015 Functional Assessment Tool Used clinical judgment Functional Limitations Swallowing Swallow Current Status (W0981) CJ Swallow  Goal Status (X9147) CI Swallow Discharge Status (W2956) (None) Motor Speech Current Status (O1308) (None) Motor Speech Goal Status (M5784) (None) Motor Speech Goal Status (O9629) (None) Spoken Language Comprehension Current Status (B2841) (None) Spoken Language Comprehension Goal Status (L2440) (None) Spoken Language Comprehension Discharge Status (N0272) (None) Spoken Language Expression Current Status (Z3664) (None) Spoken Language Expression Goal Status (Q0347) (None) Spoken Language Expression Discharge Status [phone]) (None) Attention Current Status (G3875) (None) Attention Goal Status (I4332) (None) Attention Discharge Status (R5188) (None) Memory Current Status (C1660) (None) Memory Goal Status (Y3016) (None) Memory Discharge Status (W1093) (None) Voice Current Status (A3557) (None) Voice Goal Status (D2202) (None) Voice Discharge Status (R4270) (None) Other Speech-Language Pathology Functional Limitation (W2376) (None) Other Speech-Language Pathology Functional Limitation Goal Status (E8315) (None) Other Speech-Language Pathology Functional Limitation Discharge Status [phone]) (None) Ferdinand Lango MA, CCC-SLP [phone] McCoy Leah Meryl 05/15/2016, 10:45 AM  Cardiac Studies   Echocardiogram 05/09/2016: Study Conclusions  - Left ventricle: The cavity size was normal. Wall thickness was   normal. Systolic function was normal. The estimated ejection   fraction was in the range of 60% to 65%. Wall motion was normal;   there were no regional wall motion abnormalities. - Aortic valve: There was trivial regurgitation. - Left atrium: The atrium was mildly dilated. Volume/bsa, ES,   (1-plane Simpson&'s, A2C): 45.6 ml/m^2. - Right atrium: The atrium was moderately dilated. - Tricuspid valve: There was moderate regurgitation. - Pulmonary arteries: Systolic pressure was mildly increased. PA   peak pressure: 31 mm Hg (S).  Impressions:  - No cardiac source of emboli was  indentified.  Patient Profile     83 y.o. female with persistent atrial flutter, CKD stage III, hypothyroidism, nonobstructive CAD, previous stroke, COPD, gastric outlet obstruction status post PEG placement, and anemia. She is currently admitted with cough and shortness of breath, being treated for pneumonia and volume overload.  Assessment & Plan    1. Persistent atrial flutter with variable conduction. She has been taken off of amiodarone due to concern about pulmonary toxicity, currently on heart rate control strategy with bisoprolol and Cardizem CD. She converted to NSR today.  Coumadin is being adjusted by pharmacy, INR up to 2.17  2. Acute on chronic diastolic heart failure, recent echocardiogram noted above. Lasix now at 40 mg oral daily. Intake and output fairly even.  3. Acute on chronic renal insufficiency, creatinine 1.5    Signed, Laylia Mui [place], MD  05/15/2016, 3:23 PM

## 2016-05-15 NOTE — Progress Notes (Signed)
Occupational Therapy Treatment Patient Details Name: Shannon [name] MRN: [id number] DOB: 23-Apr-1923 Today's Date: 05/15/2016    History of present illness 83 y.o. female with medical history significant of persistent atrial fibrillation on amiodarone, bradycardia, CKD stage III, hypothyroidism, non-obstructive coronary artery disease, type 2 diabetes, hypertension, chronic diastolic CHF, stroke, cerebrovascular disease, COPD, asthma, gastric outlet obstruction s/p prior J tube, anemia and hyponatremia, presented with shortness of breath and cough for worsening for about 2 weeks.   OT comments  Pt progressing with OOB mobility for toileting and seated grooming. Increasing activity tolerance. Pt eager to go home.  Follow Up Recommendations  No OT follow up;Supervision/Assistance - 24 hour    Equipment Recommendations  None recommended by OT    Recommendations for Other Services      Precautions / Restrictions Precautions Precautions: Fall       Mobility Bed Mobility Overal bed mobility: Needs Assistance Bed Mobility: Supine to Sit     Supine to sit: Supervision     General bed mobility comments: increased time  Transfers Overall transfer level: Needs assistance Equipment used: None Transfers: Sit to/from Stand;Stand Pivot Transfers Sit to Stand: Min guard Stand pivot transfers: Min assist            Balance     Sitting balance-Leahy Scale: Fair       Standing balance-Leahy Scale: Poor Standing balance comment: pt reliant on bilateral UEs on external supports                   ADL Overall ADL's : Needs assistance/impaired     Grooming: Wash/dry hands;Sitting;Set up Grooming Details (indicate cue type and reason): used mouthwash with set up in sitting         Upper Body Dressing : Minimal assistance;Sitting       Toilet Transfer: Minimal assistance;Stand-pivot;BSC   Toileting- Clothing Manipulation and Hygiene: Minimal assistance;Sit to/from  stand                Vision                     Perception     Praxis      Cognition   Behavior During Therapy: [facility] for tasks assessed/performed Overall Cognitive Status: Within Functional Limits for tasks assessed                       Extremity/Trunk Assessment               Exercises     Shoulder Instructions       General Comments      Pertinent Vitals/ Pain       Pain Assessment: No/denies pain  Home Living                                          Prior Functioning/Environment              Frequency  Min 2X/week        Progress Toward Goals  OT Goals(current goals can now be found in the care plan section)  Progress towards OT goals: Progressing toward goals  Acute Rehab OT Goals Patient Stated Goal: to get better Time For Goal Achievement: 05/23/16 Potential to Achieve Goals: Fair  Plan Discharge plan remains appropriate    Co-evaluation  End of Session Equipment Utilized During Treatment: Gait belt   Activity Tolerance Patient tolerated treatment well   Patient Left in chair;with call bell/phone within reach;with chair alarm set   Nurse Communication          Time: [phone] OT Time Calculation (min): 16 min  Charges: OT General Charges $OT Visit: 1 Procedure OT Treatments $Self Care/Home Management : 8-22 mins  Evern Bio 05/15/2016, 4:57 PM  [phone]

## 2016-05-15 NOTE — Progress Notes (Signed)
Nutrition Follow-up   INTERVENTION:  Provide Osmolite 1.5@ 61m/hr via J port of GJ tube atrate of 632mhr for 12 hours daily from 2000 hr to 0800 hr. Provide 30 ml Prostat once daily via J-tube.   Tube feeding regimen provides 1270kcal (92% of needs), 64grams of protein, and 59223mf H2O.   D/C Glucerna Shakes. Provide Ensure Enlive BID PRN and Mighty Shakes BID with meals.    NUTRITION DIAGNOSIS:   Inadequate oral intake related to altered GI function as evidenced by per patient/family report.  ongoing  GOAL:   Patient will meet greater than or equal to 90% of their needs  Being met  MONITOR:   TF tolerance, PO intake, I & O's, Labs, Skin  REASON FOR ASSESSMENT:   Consult Enteral/tube feeding initiation and management  ASSESSMENT:   [age]. female with medical history significant of persistent atrial fibrillation on amiodarone, bradycardia, CKD stage III, hypothyroidism, non-obstructive coronary artery disease, type 2 diabetes, hypertension, chronic diastolic CHF, stroke, cerebrovascular disease, COPD, asthma, gastric outlet obstruction s/p prior J tube, anemia and hyponatremia, presented with shortness of breath and cough for worsening for about 2 weeks.  Pt had to have PEG-J tube de-clogged by IR on 2/7, but tube clogged again. Per nursing notes, pt was given extra nutritional supplements PO to maximize nutrition without tube feedings.  Pt had feeding tube replaced today. Pt had MBS this morning and SLP recommended nectar-thick liquids. Pt had Glucerna Shake with straw and stated that she had no difficulty swallowing Glucerna. RD recommended trying a different nectar-thick supplement; pt agreeable.   Labs: low sodium, low chloride, elevated BUN, elevated prothrombin time  Diet Order:  Diet clear liquid Room service appropriate? Yes; Fluid consistency: Nectar Thick  Skin:  Reviewed, no issues  Last BM:  2/11  Height:   Ht Readings from Last 1 Encounters:   05/08/16 '5\' 6"'  (1.676 m)    Weight:   Wt Readings from Last 1 Encounters:  05/15/16 137 lb (62.1 kg)    Ideal Body Weight:  59.09 kg  BMI:  Body mass index is 22.11 kg/m.  Estimated Nutritional Needs:   Kcal:  [id number]:  75-85 grams  Fluid:  1.4-1.6 L/day  EDUCATION NEEDS:   No education needs identified at this time  ReaScarlette Ar, LDN, CSP Inpatient Clinical Dietitian Pager: [phone] Hours Pager: [phone]

## 2016-05-15 NOTE — Progress Notes (Signed)
ANTICOAGULATION CONSULT NOTE - Follow Up Consult  Pharmacy Consult for Coumadin Indication: atrial fibrillation  Allergies  Allergen Reactions  . Penicillins Itching and Rash    Has patient had a PCN reaction causing immediate rash, facial/tongue/throat swelling, SOB or lightheadedness with hypotension: Yes Has patient had a PCN reaction causing severe rash involving mucus membranes or skin necrosis: Unk Has patient had a PCN reaction that required hospitalization: Unk Has patient had a PCN reaction occurring within the last 10 years: Unk If all of the above answers are "NO", then may proceed with Cephalosporin use.      Patient Measurements: Height: 5\' 6"  (167.6 cm) Weight: 137 lb (62.1 kg) (a scale) IBW/kg (Calculated) : 59.3  Vital Signs: Temp: 98 F (36.7 C) (02/12 0702) Temp Source: Oral (02/12 0702) BP: 143/58 (02/12 0702) Pulse Rate: 65 (02/12 0702)  Labs:  Recent Labs  05/13/16 0433 05/13/16 1521 05/13/16 2340 05/14/16 0619 05/15/16 0415  LABPROT 20.4*  --   --  21.9* 24.6*  INR 1.73  --   --  1.89 2.17  CREATININE  --  1.31* 1.33* 1.33*  --     Estimated Creatinine Clearance: 26.3 mL/min (by C-G formula based on SCr of 1.33 mg/dL (H)).   Assessment:  Anticoag: Warf PTA for AFib (goal 2-3) to resume per pharmacy. INR subtherapeutic at 1.40 on admit (low last outpatient visits as well). Amio stopped 2/8, ABx to stop 2/12. Missed doses 2/5 and 2/6 due to clogged tube INR today = 2.17  PTA Dose = 3.75mg  daily except 2.5mg  Monday - may have incr. warfarin needs w/ amio stopped  Goal of Therapy:  INR 2-3 Heparin level 0.3-0.7 units/ml Monitor platelets by anticoagulation protocol: Yes   Plan:  Repeat Warfarin 5 mg po x 1 Daily INR, s/sx bleeding   Shannon [name] S. Alford Highland, PharmD, BCPS Clinical Staff Pharmacist Pager [phone]  Shannon [name] 05/15/2016,9:52 AM

## 2016-05-15 NOTE — Care Management Important Message (Signed)
Important Message  Patient Details  Name: Shannon [name] MRN: [id number] Date of Birth: 1923-11-28   Medicare Important Message Given:  Yes    [name] [address] 05/15/2016, 4:16 PM

## 2016-05-15 NOTE — Progress Notes (Signed)
Modified Barium Swallow Progress Note  Patient Details  Name: Shannon [name] MRN: [id number] Date of Birth: 07-29-1923  Today's Date: 05/15/2016  Modified Barium Swallow completed.  Full report located under Chart Review in the Imaging Section.  Brief recommendations include the following:  Clinical Impression  Patient presents with mild oropharyngeal dysphagia characterized by delayed swallow initiation resulting in silent aspiration of thin liquids. Additionally, patient with mild vallecular residuals post swallow due to base of tongue weakness leading to one episode of silent aspiration of residuals post swallow. Use of chin tuck did prevent penetration/aspiration of thin liquids during todays study. Would consider upgrade to thin liquids once patient can complete chin tuck consistently. Note that long h/o esophageal dysphagia does also increase aspiration risk. Will continue to f/u.    Swallow Evaluation Recommendations       SLP Diet Recommendations: Nectar thick liquid (clear liquids thickened to nectar)   Liquid Administration via: Cup;No straw   Medication Administration: Crushed with puree   Supervision: Patient able to self feed;Full supervision/cueing for compensatory strategies   Compensations: Slow rate;Small sips/bites   Postural Changes: Seated upright at 90 degrees;Remain semi-upright after after feeds/meals (Comment)   Oral Care Recommendations: Oral care BID   Other Recommendations: Order thickener from pharmacy;Prohibited food (jello, ice cream, thin soups)  Jassen Sarver MA, CCC-SLP [phone]   [name] 05/15/2016,10:44 AM

## 2016-05-15 NOTE — Progress Notes (Signed)
PROGRESS NOTE  Shannon [name] ZOX:[id number] DOB: [birth year] DOA: 05/08/2016 PCP: Kirt Boys, DO   LOS: 7 days   Brief Narrative: 83 y.o.femalewith medical history significant of persistent atrial fibrillation on amiodarone, bradycardia, CKD stage III,hypothyroidism,non-obstructive coronary artery disease, type 2 diabetes, hypertension, chronic diastolic CHF, stroke, cerebrovascular disease, COPD, asthma, gastric outlet obstruction s/p prior J tube, anemia and hyponatremia, presented with shortness of breath and cough for worsening for about 2 weeks.  Assessment & Plan: Active Problems:   Persistent atrial fibrillation (HCC)   Hypothyroidism   CAP (community acquired pneumonia)   Chronic kidney insufficiency, stage 3 (moderate)   Hyponatremia   Diabetic polyneuropathy associated with type 2 diabetes mellitus (HCC)   Acute on chronic diastolic CHF (congestive heart failure), NYHA class 4 (HCC)   PEG (percutaneous endoscopic gastrostomy) status (HCC)   HCAP (healthcare-associated pneumonia)   Possible community acquired pneumonia - Patient is s/p complete course of 7 days of ceftriaxone and azithromycin - Respiratory virus panel was negative - Continue supportive care - Patient had worsening cough yesterday, and states that overall she feels worse  Hypoxic respiratory failure - wean off oxygen as tolerated, now resolved.  - she remains on room air this morning - Amiodarone discontinued by cardiology due to concern for pulmonary toxicity  H/o gastric outlet obstruction - With PEG-J tube, declogged by IR 2/7, now clogged again.  - Supposed to go for PEG tube replacement sometimes today  Acute on chronic diastolic congestive heart failure - Patient was initially on IV Lasix, this was converted to by mouth on 2/6, she lost ~ 6 lbs (141>135) and was converted to po, however she remained persistently dyspneic and cardiology was consulted on 2/8, and IV Lasix was resumed 2/8 - 2-D  echo done on 05/09/16 and showed ejection fraction of 60-65% - Patient has no chest pain, troponin not elevated. - Continue Imdur, hydralazine - Renal function has remained stable - Converted back to by mouth Lasix on 2/11  Persistent atrial fibrillation / flutter - Continue diltiazem, Coumadin per pharmacy - Amiodarone discontinued as above  Acute on chronic hyponatremia likely in the setting of CHF - Serum sodium has remained stable at 128 >> 127 >> 127 >> 131>>132>>132 >> today's labs are pending  Chronic kidney disease is stage III  - Monitor BMP and electrolytes. Repeat lab in the morning. - Slight creatinine stable ~1.3, today's labs are pending  Hypothyroidism  - Continue Synthroid - TSH 7.4, will need to be rechecked as an outpatient in ~ 3 weeks  Left leg pain, chronic in nature - Ultrasound negative for DVT  Goals of care discussion - DNR, thus with daughter at bedside regarding palliative, she has seen within the past but is not too keen on discussing with them again. I brought up the idea of hospice however she seems reluctant   DVT prophylaxis: Coumadin Code Status: DNR Family Communication: daughter bedside Disposition Plan: d/c 1-2 days  Consultants:   Cardiology  Procedures:   None   Antimicrobials:  Ceftriaxone and azithromycin 2/5 >> 2/12  Subjective: - eating breakfast, appears to be a little bit short-winded as morning  Objective: Vitals:   05/14/16 1921 05/14/16 2023 05/15/16 0702 05/15/16 0804  BP:  107/66 (!) 143/58   Pulse:  (!) 101 65   Resp:  17    Temp:  98.2 F (36.8 C) 98 F (36.7 C)   TempSrc:  Oral Oral   SpO2: 98% 95% 93% 95%  Weight:   62.1  kg (137 lb)   Height:        Intake/Output Summary (Last 24 hours) at 05/15/16 0928 Last data filed at 05/15/16 0400  Gross per 24 hour  Intake              480 ml  Output              150 ml  Net              330 ml   Filed Weights   05/13/16 0423 05/14/16 0543 05/15/16 0702    Weight: 62.6 kg (138 lb) 61.9 kg (136 lb 6.4 oz) 62.1 kg (137 lb)    Examination: Constitutional: NAD Vitals:   05/14/16 1921 05/14/16 2023 05/15/16 0702 05/15/16 0804  BP:  107/66 (!) 143/58   Pulse:  (!) 101 65   Resp:  17    Temp:  98.2 F (36.8 C) 98 F (36.7 C)   TempSrc:  Oral Oral   SpO2: 98% 95% 93% 95%  Weight:   62.1 kg (137 lb)   Height:       ENMT: Mucous membranes are moist.  Neck: normal, supple, no masses Respiratory: coarse breath sounds, no wheezing, no crackles.  Cardiovascular: Regular rate and rhythm, no murmurs / rubs / gallops. Trace LE edema. 2+ pedal pulses.  Abdomen: no tenderness. Bowel sounds positive.  Neurologic: non focal    Data Reviewed: I have personally reviewed following labs and imaging studies  CBC:  Recent Labs Lab 05/08/16 1241 05/09/16 0953 05/12/16 0350  WBC 6.4 6.2 5.8  HGB 10.2* 10.4* 9.7*  HCT 30.2* 30.3* 28.6*  MCV 81.0 81.7 82.7  PLT 301 287 284   Basic Metabolic Panel:  Recent Labs Lab 05/11/16 0545 05/12/16 0350 05/13/16 1521 05/13/16 2340 05/14/16 0619  NA 127* 127* 131* 132* 132*  K 3.7 3.6 4.0 3.8 3.8  CL 88* 89* 93* 93* 95*  CO2 26 25 27 27 26   GLUCOSE 130* 209* 93 144* 142*  BUN 47* 56* 48* 51* 49*  CREATININE 1.59* 1.82* 1.31* 1.33* 1.33*  CALCIUM 9.5 9.0 9.3 9.3 9.5   Coagulation Profile:  Recent Labs Lab 05/11/16 0545 05/12/16 0350 05/13/16 0433 05/14/16 0619 05/15/16 0415  INR 1.36 1.47 1.73 1.89 2.17   BNP (last 3 results)  Recent Labs  05/08/16 1053  PROBNP 1,132*   CBG:  Recent Labs Lab 05/14/16 0752 05/14/16 1136 05/14/16 1612 05/14/16 2137 05/15/16 0739  GLUCAP 151* 183* 147* 168* 150*   Thyroid Function Tests: No results for input(s): TSH, T4TOTAL, FREET4, T3FREE, THYROIDAB in the last 72 hours. Urine analysis:    Component Value Date/Time   COLORURINE YELLOW 05/10/2016 1539   APPEARANCEUR CLEAR 05/10/2016 1539   LABSPEC 1.009 05/10/2016 1539   PHURINE 7.0  05/10/2016 1539   GLUCOSEU NEGATIVE 05/10/2016 1539   HGBUR NEGATIVE 05/10/2016 1539   BILIRUBINUR NEGATIVE 05/10/2016 1539   KETONESUR NEGATIVE 05/10/2016 1539   PROTEINUR NEGATIVE 05/10/2016 1539   UROBILINOGEN 0.2 01/27/2015 2308   NITRITE NEGATIVE 05/10/2016 1539   LEUKOCYTESUR NEGATIVE 05/10/2016 1539   Sepsis Labs: Invalid input(s): PROCALCITONIN, LACTICIDVEN  Recent Results (from the past 240 hour(s))  Blood Culture (routine x 2)     Status: None   Collection Time: 05/08/16  4:30 PM  Result Value Ref Range Status   Specimen Description BLOOD LEFT FOREARM  Final   Special Requests IN PEDIATRIC BOTTLE 1CC  Final   Culture NO GROWTH 5 DAYS  Final  Report Status 05/13/2016 FINAL  Final  Blood Culture (routine x 2)     Status: None   Collection Time: 05/08/16  4:35 PM  Result Value Ref Range Status   Specimen Description BLOOD RIGHT HAND  Final   Special Requests BOTTLES DRAWN AEROBIC AND ANAEROBIC 5CC  Final   Culture NO GROWTH 5 DAYS  Final   Report Status 05/13/2016 FINAL  Final  MRSA PCR Screening     Status: None   Collection Time: 05/08/16  7:32 PM  Result Value Ref Range Status   MRSA by PCR NEGATIVE NEGATIVE Final    Comment:        The GeneXpert MRSA Assay (FDA approved for NASAL specimens only), is one component of a comprehensive MRSA colonization surveillance program. It is not intended to diagnose MRSA infection nor to guide or monitor treatment for MRSA infections.   Respiratory Panel by PCR     Status: None   Collection Time: 05/08/16  7:35 PM  Result Value Ref Range Status   Adenovirus NOT DETECTED NOT DETECTED Final   Coronavirus 229E NOT DETECTED NOT DETECTED Final   Coronavirus HKU1 NOT DETECTED NOT DETECTED Final   Coronavirus NL63 NOT DETECTED NOT DETECTED Final   Coronavirus OC43 NOT DETECTED NOT DETECTED Final   Metapneumovirus NOT DETECTED NOT DETECTED Final   Rhinovirus / Enterovirus NOT DETECTED NOT DETECTED Final   Influenza A NOT  DETECTED NOT DETECTED Final   Influenza B NOT DETECTED NOT DETECTED Final   Parainfluenza Virus 1 NOT DETECTED NOT DETECTED Final   Parainfluenza Virus 2 NOT DETECTED NOT DETECTED Final   Parainfluenza Virus 3 NOT DETECTED NOT DETECTED Final   Parainfluenza Virus 4 NOT DETECTED NOT DETECTED Final   Respiratory Syncytial Virus NOT DETECTED NOT DETECTED Final   Bordetella pertussis NOT DETECTED NOT DETECTED Final   Chlamydophila pneumoniae NOT DETECTED NOT DETECTED Final   Mycoplasma pneumoniae NOT DETECTED NOT DETECTED Final  Culture, sputum-assessment     Status: None   Collection Time: 05/09/16  4:01 AM  Result Value Ref Range Status   Specimen Description EXPECTORATED SPUTUM  Final   Special Requests NONE  Final   Sputum evaluation   Final    Sputum specimen not acceptable for testing.  Please recollect.   Gram Stain Report Called to,Read Back By and Verified With: R CULBREN,RN @0710  05/09/16 MKELLY,MLT   Report Status 05/09/2016 FINAL  Final      Radiology Studies: No results found.   Scheduled Meds: . atorvastatin  10 mg Oral Daily  . azithromycin  500 mg Intravenous Q24H  . bacitracin-polymyxin b   Both Eyes BID  . bisoprolol  5 mg Oral Daily  . calcium-vitamin D  1 tablet Oral Q breakfast  . cefTRIAXone (ROCEPHIN)  IV  1 g Intravenous Q24H  . chlorhexidine  15 mL Mouth/Throat BID  . cycloSPORINE  1 drop Both Eyes BID  . diltiazem  120 mg Oral Daily  . feeding supplement (GLUCERNA SHAKE)  237 mL Oral q12n4p  . feeding supplement (PRO-STAT SUGAR FREE 64)  30 mL Per Tube Daily  . fluticasone  2 spray Each Nare Daily  . furosemide  40 mg Oral Daily  . gabapentin  300 mg Oral QHS  . hydrALAZINE  50 mg Oral TID  . insulin aspart  0-9 Units Subcutaneous TID WC  . ipratropium-albuterol  3 mL Nebulization TID  . isosorbide mononitrate  30 mg Oral Daily  . latanoprost  1 drop Both  Eyes QHS  . levothyroxine  137 mcg Oral QAC breakfast  . loratadine  10 mg Oral Daily  .  multivitamin with minerals  1 tablet Oral Daily  . pantoprazole  40 mg Oral BID  . polyvinyl alcohol  1 drop Both Eyes QID  . sertraline  100 mg Oral Daily  . Warfarin - Pharmacist Dosing Inpatient   Does not apply q1800   Continuous Infusions: . feeding supplement (OSMOLITE 1.5 CAL) Stopped (05/13/16 0900)   Pamella Pert, MD, PhD [facility] Pager [phone]  If 7PM-7AM, please contact night-coverage [email or web address] Password [facility] 05/15/2016, 9:28 AM

## 2016-05-15 NOTE — Progress Notes (Addendum)
PT Cancellation Note  Patient Details Name: Shannon [name] MRN: [id number] DOB: 13-Sep-1923   Cancelled Treatment:    Reason Eval/Treat Not Completed: Patient at procedure or test/unavailable. Pt off the floor for PEG tube replacement. PT to re-attempt tomorrow.   Lorriane Shire 05/15/2016, 9:46 AM

## 2016-05-16 ENCOUNTER — Encounter (HOSPITAL_COMMUNITY): Payer: Self-pay

## 2016-05-16 ENCOUNTER — Inpatient Hospital Stay (HOSPITAL_COMMUNITY): Payer: Medicare Other

## 2016-05-16 LAB — BASIC METABOLIC PANEL
Anion gap: 14 (ref 5–15)
BUN: 54 mg/dL — ABNORMAL HIGH (ref 6–20)
CO2: 26 mmol/L (ref 22–32)
Calcium: 9.4 mg/dL (ref 8.9–10.3)
Chloride: 94 mmol/L — ABNORMAL LOW (ref 101–111)
Creatinine, Ser: 1.46 mg/dL — ABNORMAL HIGH (ref 0.44–1.00)
GFR calc Af Amer: 35 mL/min — ABNORMAL LOW (ref >60)
GFR calc non Af Amer: 30 mL/min — ABNORMAL LOW (ref >60)
Glucose, Bld: 193 mg/dL — ABNORMAL HIGH (ref 65–99)
Potassium: 3.8 mmol/L (ref 3.5–5.1)
Sodium: 134 mmol/L — ABNORMAL LOW (ref 135–145)

## 2016-05-16 LAB — CBC
HCT: 29.8 % — ABNORMAL LOW (ref 36.0–46.0)
Hemoglobin: 9.9 g/dL — ABNORMAL LOW (ref 12.0–15.0)
MCH: 27.4 pg (ref 26.0–34.0)
MCHC: 33.2 g/dL (ref 30.0–36.0)
MCV: 82.5 fL (ref 78.0–100.0)
Platelets: 358 10*3/uL (ref 150–400)
RBC: 3.61 MIL/uL — ABNORMAL LOW (ref 3.87–5.11)
RDW: 19 % — ABNORMAL HIGH (ref 11.5–15.5)
WBC: 6.6 10*3/uL (ref 4.0–10.5)

## 2016-05-16 LAB — GLUCOSE, CAPILLARY
Glucose-Capillary: 208 mg/dL — ABNORMAL HIGH (ref 65–99)
Glucose-Capillary: 154 mg/dL — ABNORMAL HIGH (ref 65–99)
Glucose-Capillary: 179 mg/dL — ABNORMAL HIGH (ref 65–99)
Glucose-Capillary: 184 mg/dL — ABNORMAL HIGH (ref 65–99)

## 2016-05-16 LAB — PROTIME-INR
INR: 2.33
Prothrombin Time: 26 seconds — ABNORMAL HIGH (ref 11.4–15.2)

## 2016-05-16 MED ORDER — WARFARIN SODIUM 5 MG PO TABS
5.0000 mg | ORAL_TABLET | Freq: Once | ORAL | Status: AC
  Administered 2016-05-16: 5 mg via ORAL
  Filled 2016-05-16: qty 1

## 2016-05-16 MED ORDER — FUROSEMIDE 10 MG/ML IJ SOLN
40.0000 mg | Freq: Once | INTRAMUSCULAR | Status: AC
  Administered 2016-05-16: 40 mg via INTRAVENOUS
  Filled 2016-05-16: qty 4

## 2016-05-16 NOTE — Progress Notes (Signed)
Call from [place] to relay Shannon [name] showed "asystole" then no tele-data.   Upon entering room, Shannon [name] was found with MD at bedside c/a/ox3.   Tech to replace leads while checking her CBG.   Shannon [name] denies complaints.   MD witness to Shannon [name] c/a/ox3, and malfunctioning telemetry device.

## 2016-05-16 NOTE — Progress Notes (Signed)
Progress Note  Patient Name: Shannon [name] Date of Encounter: 05/16/2016  Primary Cardiologist: Dr. Tobias Alexander  Subjective   States she is OK today. No chest pain. Notes she gets SOB easily with activity.  Inpatient Medications    Scheduled Meds: . atorvastatin  10 mg Oral Daily  . bacitracin-polymyxin b   Both Eyes BID  . bisoprolol  5 mg Oral Daily  . calcium-vitamin D  1 tablet Oral Q breakfast  . chlorhexidine  15 mL Mouth/Throat BID  . cycloSPORINE  1 drop Both Eyes BID  . diltiazem  120 mg Oral Daily  . feeding supplement (ENSURE ENLIVE)  237 mL Oral Q24H  . feeding supplement (PRO-STAT SUGAR FREE 64)  30 mL Per Tube Daily  . fluticasone  2 spray Each Nare Daily  . furosemide  40 mg Intravenous Once  . gabapentin  300 mg Oral QHS  . hydrALAZINE  50 mg Oral TID  . insulin aspart  0-9 Units Subcutaneous TID WC  . ipratropium-albuterol  3 mL Nebulization TID  . isosorbide mononitrate  30 mg Oral Daily  . latanoprost  1 drop Both Eyes QHS  . levothyroxine  137 mcg Oral QAC breakfast  . loratadine  10 mg Oral Daily  . multivitamin with minerals  1 tablet Oral Daily  . pantoprazole  40 mg Oral BID  . polyvinyl alcohol  1 drop Both Eyes QID  . sertraline  100 mg Oral Daily  . Warfarin - Pharmacist Dosing Inpatient   Does not apply q1800   Continuous Infusions: . feeding supplement (OSMOLITE 1.5 CAL) Stopped (05/16/16 0900)   PRN Meds: acetaminophen, albuterol, benzonatate, bisacodyl, HYDROcodone-acetaminophen, lidocaine, RESOURCE THICKENUP CLEAR, traMADol   Vital Signs    Vitals:   05/16/16 0747 05/16/16 0941 05/16/16 1012 05/16/16 1150  BP: (!) 152/50  (!) 153/56 135/60  Pulse: 66   66  Resp: 16   20  Temp: 99.1 F (37.3 C)   98.3 F (36.8 C)  TempSrc: Oral   Oral  SpO2: 95% 98%  100%  Weight:      Height:        Intake/Output Summary (Last 24 hours) at 05/16/16 1210 Last data filed at 05/16/16 1030  Gross per 24 hour  Intake             1600 ml    Output              601 ml  Net              999 ml   Filed Weights   05/14/16 0543 05/15/16 0702 05/16/16 0521  Weight: 136 lb 6.4 oz (61.9 kg) 137 lb (62.1 kg) 137 lb (62.1 kg)    Telemetry    I personally reviewed telemetry which shows NSR.   ECG    I personally reviewed the tracing from 05/08/2016 which showed atrial flutter with variable conduction.  Physical Exam   GEN: Frail elderly woman, no acute distress. Gasps when she talks. Neck: No JVD Cardiac: regularly regular, soft systolic murmur, no gallop.  Respiratory: Clear to auscultation bilaterally. GI: Soft, non-distended, PEG tube. MS: No edema; kyphosis.  Labs    Chemistry  Recent Labs Lab 05/14/16 0619 05/15/16 1027 05/16/16 0343  NA 132* 132* 134*  K 3.8 3.8 3.8  CL 95* 95* 94*  CO2 26 25 26   GLUCOSE 142* 213* 193*  BUN 49* 52* 54*  CREATININE 1.33* 1.50* 1.46*  CALCIUM 9.5 9.3 9.4  GFRNONAA 34* 29* 30*  GFRAA 39* 34* 35*  ANIONGAP 11 12 14      Hematology  Recent Labs Lab 05/12/16 0350 05/16/16 0343  WBC 5.8 6.6  RBC 3.46* 3.61*  HGB 9.7* 9.9*  HCT 28.6* 29.8*  MCV 82.7 82.5  MCH 28.0 27.4  MCHC 33.9 33.2  RDW 19.0* 19.0*  PLT 284 358    Cardiac EnzymesNo results for input(s): TROPONINI in the last 168 hours.  No results for input(s): TROPIPOC in the last 168 hours.   Radiology    Ir Gj Tube Change  Result Date: 05/15/2016 Please refer to "Notes" to see consult details.  Dg Swallowing Func-speech Pathology  Result Date: 05/15/2016 Objective Swallowing Evaluation: Type of Study: MBS-Modified Barium Swallow Study Patient Details Name: Shannon [name] MRN: [id number] Date of Birth: 01-11-1924 Today's Date: 05/15/2016 Time: SLP Start Time (ACUTE ONLY): 0955-SLP Stop Time (ACUTE ONLY): 1014 SLP Time Calculation (min) (ACUTE ONLY): 19 min Past Medical History: Past Medical History: Diagnosis Date . Anemia   previous blood transfusions . Arthritis   "all over" . Asthma  . Bradycardia    requiring previous d/c of BB and reduction of amiodarone . CAD (coronary artery disease)   nonobstructive per notes . Chronic diastolic CHF (congestive heart failure) (HCC)  . CKD (chronic kidney disease), stage III  . Complication of blood transfusion   "got the wrong blood type at [facility] in ~ 2015; no adverse reaction that we are aware of"/daughter, [name] (01/27/2016) . COPD (chronic obstructive pulmonary disease) (HCC)  . Depression   "light case" . DVT (deep venous thrombosis) (HCC) 01/2016  a. LLE DVT 01/2016 - switched from Eliquis to Coumadin. . Gastric stenosis   a. s/p stomach tube . GERD (gastroesophageal reflux disease)  . History of blood transfusion   "several" (01/27/2016) . History of stomach ulcers  . Hyperlipidemia  . Hypertension  . Hypothyroidism  . Paraesophageal hernia  . Perforated gastric ulcer (HCC)  . Seasonal allergies  . SIADH (syndrome of inappropriate ADH production) (HCC)   [name] 01/10/2015 . Small bowel obstruction   "I don't know how many" (01/11/2015) . Stroke (HCC)   "light one" . Type II diabetes mellitus (HCC)   "related to prednisone use  for > 20 yr; once predinose stopped; no more DM RX" (01/27/2016) . Ventral hernia with bowel obstruction  Past Surgical History: Past Surgical History: Procedure Laterality Date . CATARACT EXTRACTION W/ INTRAOCULAR LENS  IMPLANT, BILATERAL   . CHOLECYSTECTOMY OPEN   . COLECTOMY   . ESOPHAGOGASTRODUODENOSCOPY N/A 01/19/2014  Procedure: ESOPHAGOGASTRODUODENOSCOPY (EGD);  Surgeon: Hilarie Fredrickson, MD;  Location: [facility];  Service: Endoscopy;  Laterality: N/A; . ESOPHAGOGASTRODUODENOSCOPY N/A 01/20/2014  Procedure: ESOPHAGOGASTRODUODENOSCOPY (EGD);  Surgeon: Hilarie Fredrickson, MD;  Location: [facility];  Service: Endoscopy;  Laterality: N/A; . ESOPHAGOGASTRODUODENOSCOPY N/A 03/19/2014  Procedure: ESOPHAGOGASTRODUODENOSCOPY (EGD);  Surgeon: Rachael Fee, MD;  Location: [facility];  Service: Endoscopy;  Laterality: N/A; .  ESOPHAGOGASTRODUODENOSCOPY N/A 07/08/2015  Procedure: ESOPHAGOGASTRODUODENOSCOPY (EGD);  Surgeon: Sherrilyn Rist, MD;  Location: [facility] ENDOSCOPY;  Service: Endoscopy;  Laterality: N/A; . ESOPHAGOGASTRODUODENOSCOPY (EGD) WITH PROPOFOL N/A 09/15/2015  Procedure: ESOPHAGOGASTRODUODENOSCOPY (EGD) WITH PROPOFOL;  Surgeon: Ruffin Frederick, MD;  Location: [facility];  Service: Gastroenterology;  Laterality: N/A; . GASTROJEJUNOSTOMY    hx/notes 01/10/2015 . GASTROJEJUNOSTOMY N/A 09/23/2015  Procedure: OPEN GASTROJEJUNOSTOMY TUBE PLACEMENT;  Surgeon: De Blanch Kinsinger, MD;  Location: [facility] ORS;  Service: General;  Laterality: N/A; . GLAUCOMA SURGERY Bilateral  . HERNIA  REPAIR  2015 . IR GENERIC HISTORICAL  01/07/2016  IR GJ TUBE CHANGE 01/07/2016 Malachy Moan, MD [facility] . IR GENERIC HISTORICAL  01/27/2016  IR MECH REMOV OBSTRUC MAT ANY COLON TUBE W/FLUORO 01/27/2016 Richarda Overlie, MD [facility] . IR GENERIC HISTORICAL  02/07/2016  IR PATIENT EVAL TECH 0-60 MINS Darrell K Allred, PA-C [facility] . IR GENERIC HISTORICAL  02/08/2016  IR GJ TUBE CHANGE 02/08/2016 Berdine Dance, MD [facility] . IR GENERIC HISTORICAL  01/06/2016  IR GJ TUBE CHANGE 01/06/2016 [facility] OUT REF . IR GENERIC HISTORICAL  05/02/2016  IR CM INJ ANY COLONIC TUBE W/FLUORO 05/02/2016 Oley Balm, MD [facility] . LAPAROTOMY N/A 01/20/2015  Procedure: EXPLORATORY LAPAROTOMY;  Surgeon: Abigail Miyamoto, MD;  Location: [facility] OR;  Service: General;  Laterality: N/A; . LYSIS OF ADHESION N/A 01/20/2015  Procedure: LYSIS OF ADHESIONS < 1 HOUR;  Surgeon: Abigail Miyamoto, MD;  Location: [facility] OR;  Service: General;  Laterality: N/A; . TONSILLECTOMY   . TUBAL LIGATION   . VENTRAL HERNIA REPAIR  2015  incarcerated ventral hernia ([facility] 09/2013)/notes 01/10/2015 HPI: 83 y.o. female with medical history significant of persistent atrial fibrillation on amiodarone, bradycardia, CKD stage III, hypothyroidism, non-obstructive coronary artery disease, type 2 diabetes,  hypertension, chronic diastolic CHF, stroke, cerebrovascular disease, COPD, asthma, gastric outlet obstruction s/p prior J tube, anemia and hyponatremia, presented with shortness of breath and cough for worsening for about 2 weeks. Admitted with possible pneumonia, hypoxic respiratory failure. At baseline consumes liquid diet thickened to nectar consistency with supplemental nutrition via J tube. Seen by SLP during multiple prior admissions. Pt had a MBS on 11/09/15 indicating penetration of thin liquids to level of VFs which pt was able to sense and clear most of material. Also noted prominence at CP segment. Recommendation at that time was nectar thick liquids/ full liquid diet although there were pharyngeal residuals on this consistency. During most recent admission 03/2016, recommendations were thin liquids (full liquids), medications whole with liquid. Instrumental testing declined by patient at that time. Chest x-ray 05/11/16 showed Interval development of alveolar opacity in the left lung likely in the lingula which may reflect pneumonia or atelectasis, new small left pleural effusion. Subjective: Pt semi-upright in bed, consuming clear liquids from tray which she thickens herself to nectar consistency Assessment / Plan / Recommendation [facility] IP CLINICAL IMPRESSIONS 05/15/2016 Therapy Diagnosis Mild oral phase dysphagia;Mild pharyngeal phase dysphagia Clinical Impression Patient presents with mild oropharyngeal dysphagia characterized by delayed swallow initiation resulting in silent aspiration of thin liquids. Additionally, patient with mild vallecular residuals post swallow due to base of tongue weakness leading to one episode of silent aspiration of residuals post swallow. Use of chin tuck did prevent penetration/aspiration of thin liquids during todays study. Would consider upgrade to thin liquids once patient can complete chin tuck consistently. Note that long h/o esophageal dysphagia does also increase  aspiration risk. Will continue to f/u.  Impact on safety and function Moderate aspiration risk   [facility] IP TREATMENT RECOMMENDATION 05/15/2016 Treatment Recommendations Therapy as outlined in treatment plan below   Prognosis 05/15/2016 Prognosis for Safe Diet Advancement Good Barriers to Reach Goals Time post onset Barriers/Prognosis Comment -- [facility] IP DIET RECOMMENDATION 05/15/2016 SLP Diet Recommendations Nectar thick liquid Liquid Administration via Cup;No straw Medication Administration Crushed with puree Compensations Slow rate;Small sips/bites Postural Changes Seated upright at 90 degrees;Remain semi-upright after after feeds/meals (Comment)   [facility] IP OTHER RECOMMENDATIONS 05/15/2016 Recommended Consults -- Oral Care Recommendations Oral care BID Other Recommendations  Order thickener from pharmacy;Prohibited food (jello, ice cream, thin soups)   [facility] IP FOLLOW UP RECOMMENDATIONS 05/15/2016 Follow up Recommendations Skilled Nursing facility   [facility] IP FREQUENCY AND DURATION 05/15/2016 Speech Therapy Frequency (ACUTE ONLY) min 2x/week Treatment Duration 2 weeks      [facility] IP ORAL PHASE 05/15/2016 Oral Phase WFL Oral - Pudding Teaspoon -- Oral - Pudding Cup -- Oral - Honey Teaspoon -- Oral - Honey Cup -- Oral - Nectar Teaspoon -- Oral - Nectar Cup -- Oral - Nectar Straw -- Oral - Thin Teaspoon -- Oral - Thin Cup -- Oral - Thin Straw -- Oral - Puree -- Oral - Mech Soft -- Oral - Regular -- Oral - Multi-Consistency -- Oral - Pill -- Oral Phase - Comment --  [facility] IP PHARYNGEAL PHASE 05/15/2016 Pharyngeal Phase Impaired Pharyngeal- Pudding Teaspoon -- Pharyngeal -- Pharyngeal- Pudding Cup -- Pharyngeal -- Pharyngeal- Honey Teaspoon -- Pharyngeal -- Pharyngeal- Honey Cup -- Pharyngeal -- Pharyngeal- Nectar Teaspoon Delayed swallow initiation-vallecula;Reduced tongue base retraction;Pharyngeal residue - valleculae Pharyngeal -- Pharyngeal- Nectar Cup Delayed swallow initiation-vallecula;Reduced tongue base retraction;Pharyngeal residue -  valleculae Pharyngeal -- Pharyngeal- Nectar Straw -- Pharyngeal -- Pharyngeal- Thin Teaspoon Delayed swallow initiation-vallecula;Reduced tongue base retraction;Pharyngeal residue - valleculae Pharyngeal -- Pharyngeal- Thin Cup Delayed swallow initiation-vallecula;Reduced tongue base retraction;Pharyngeal residue - valleculae;Delayed swallow initiation-pyriform sinuses;Penetration/Aspiration before swallow;Penetration/Apiration after swallow Pharyngeal Material enters airway, passes BELOW cords without attempt by patient to eject out (silent aspiration) Pharyngeal- Thin Straw Reduced tongue base retraction;Pharyngeal residue - valleculae;Delayed swallow initiation-pyriform sinuses;Delayed swallow initiation-vallecula;Penetration/Aspiration before swallow Pharyngeal Material enters airway, passes BELOW cords without attempt by patient to eject out (silent aspiration) Pharyngeal- Puree Delayed swallow initiation-vallecula;Reduced tongue base retraction;Pharyngeal residue - valleculae Pharyngeal -- Pharyngeal- Mechanical Soft -- Pharyngeal -- Pharyngeal- Regular -- Pharyngeal -- Pharyngeal- Multi-consistency -- Pharyngeal -- Pharyngeal- Pill Pharyngeal residue - valleculae;Reduced tongue base retraction Pharyngeal -- Pharyngeal Comment --  [facility] IP CERVICAL ESOPHAGEAL PHASE 05/15/2016 Cervical Esophageal Phase Impaired Pudding Teaspoon -- Pudding Cup -- Honey Teaspoon -- Honey Cup -- Nectar Teaspoon -- Nectar Cup -- Nectar Straw -- Thin Teaspoon -- Thin Cup -- Thin Straw -- Puree -- Mechanical Soft -- Regular -- Multi-consistency -- Pill -- Cervical Esophageal Comment -- [facility] IP GO 11/09/2015 Functional Assessment Tool Used clinical judgment Functional Limitations Swallowing Swallow Current Status (Z6109) CJ Swallow Goal Status (U0454) CI Swallow Discharge Status (U9811) (None) Motor Speech Current Status (B1478) (None) Motor Speech Goal Status (G9562) (None) Motor Speech Goal Status (Z3086) (None) Spoken Language  Comprehension Current Status (V7846) (None) Spoken Language Comprehension Goal Status (N6295) (None) Spoken Language Comprehension Discharge Status (M8413) (None) Spoken Language Expression Current Status (K4401) (None) Spoken Language Expression Goal Status (U2725) (None) Spoken Language Expression Discharge Status [phone]) (None) Attention Current Status (I3474) (None) Attention Goal Status (Q5956) (None) Attention Discharge Status (L8756) (None) Memory Current Status (E3329) (None) Memory Goal Status (J1884) (None) Memory Discharge Status (Z6606) (None) Voice Current Status (T0160) (None) Voice Goal Status (F0932) (None) Voice Discharge Status (T5573) (None) Other Speech-Language Pathology Functional Limitation (U2025) (None) Other Speech-Language Pathology Functional Limitation Goal Status (K2706) (None) Other Speech-Language Pathology Functional Limitation Discharge Status [phone]) (None) Ferdinand Lango MA, CCC-SLP [phone] McCoy Leah Meryl 05/15/2016, 10:45 AM               Cardiac Studies   Echocardiogram 05/09/2016: Study Conclusions  - Left ventricle: The cavity size was normal. Wall thickness was   normal. Systolic function was normal. The estimated ejection   fraction was in  the range of 60% to 65%. Wall motion was normal;   there were no regional wall motion abnormalities. - Aortic valve: There was trivial regurgitation. - Left atrium: The atrium was mildly dilated. Volume/bsa, ES,   (1-plane Simpson&'s, A2C): 45.6 ml/m^2. - Right atrium: The atrium was moderately dilated. - Tricuspid valve: There was moderate regurgitation. - Pulmonary arteries: Systolic pressure was mildly increased. PA   peak pressure: 31 mm Hg (S).  Impressions:  - No cardiac source of emboli was indentified.  Patient Profile     83 y.o. female with persistent atrial flutter, CKD stage III, hypothyroidism, nonobstructive CAD, previous stroke, COPD, gastric outlet obstruction status post PEG placement, and  anemia. She is currently admitted with cough and shortness of breath, being treated for pneumonia and volume overload.  Assessment & Plan    1. Persistent atrial flutter with variable conduction. She has been taken off of amiodarone due to concern about pulmonary toxicity, currently on heart rate control strategy with bisoprolol and Cardizem CD. She converted to NSR yesterday and is maintaining NSR.  Coumadin is being adjusted by pharmacy  2. Acute on chronic diastolic heart failure, recent echocardiogram noted above. Lasix now at 40 mg oral daily. Intake and output fairly even but weight is up one pound. She does desat with ambulation. May give additional IV lasix today. Will defer to primary team. Patient is significantly deconditioned. Little else to offer.  3. Acute on chronic renal insufficiency, creatinine 1.5  We will sign off now. Please call if we can assist further.  Signed, Braven Wolk [place], MD  05/16/2016, 12:10 PM

## 2016-05-16 NOTE — Progress Notes (Signed)
Physical Therapy Treatment Patient Details Name: Shannon [name] MRN: [id number] DOB: 05/30/1923 Today's Date: 05/16/2016    History of Present Illness 83 y.o. female with medical history significant of persistent atrial fibrillation on amiodarone, bradycardia, CKD stage III, hypothyroidism, non-obstructive coronary artery disease, type 2 diabetes, hypertension, chronic diastolic CHF, stroke, cerebrovascular disease, COPD, asthma, gastric outlet obstruction s/p prior J tube, anemia and hyponatremia, presented with shortness of breath and cough for worsening for about 2 weeks.    PT Comments    Excellent progress with mobility/gait. Pt very motivated to participate with therapy. Pt on RA throughout Rx with sats ranging 86-91%.  Follow Up Recommendations  Home health PT;Supervision/Assistance - 24 hour     Equipment Recommendations  None recommended by PT    Recommendations for Other Services       Precautions / Restrictions Precautions Precautions: Fall    Mobility  Bed Mobility         Supine to sit: Supervision     General bed mobility comments: increased time, +rail  Transfers   Equipment used: Ambulation equipment used   Sit to Stand: Min guard         General transfer comment: increased time, min guard for safety  Ambulation/Gait Ambulation/Gait assistance: Min guard Ambulation Distance (Feet): 100 Feet Assistive device: Rolling walker (2 wheeled) Gait Pattern/deviations: Step-through pattern;Decreased stride length;Trunk flexed Gait velocity: decreased Gait velocity interpretation: Below normal speed for age/gender General Gait Details: Min guard for safety. No LOB. Steady gait with RW.   Stairs            Wheelchair Mobility    Modified Rankin (Stroke Patients Only)       Balance   Sitting-balance support: Feet supported;No upper extremity supported Sitting balance-Leahy Scale: Good     Standing balance support: During functional  activity;Bilateral upper extremity supported Standing balance-Leahy Scale: Poor Standing balance comment: reliant on RW for ambulation                    Cognition Arousal/Alertness: Awake/alert Behavior During Therapy: WFL for tasks assessed/performed Overall Cognitive Status: Within Functional Limits for tasks assessed                      Exercises      General Comments        Pertinent Vitals/Pain Pain Assessment: No/denies pain    Home Living                      Prior Function            PT Goals (current goals can now be found in the care plan section) Acute Rehab PT Goals Patient Stated Goal: to get better PT Goal Formulation: With patient Time For Goal Achievement: 05/23/16 Potential to Achieve Goals: Good Progress towards PT goals: Progressing toward goals    Frequency    Min 3X/week      PT Plan Current plan remains appropriate    Co-evaluation             End of Session Equipment Utilized During Treatment: Gait belt Activity Tolerance: Patient tolerated treatment well Patient left: in chair;with chair alarm set;with call bell/phone within reach     Time: 1032-1056 PT Time Calculation (min) (ACUTE ONLY): 24 min  Charges:  $Gait Training: 23-37 mins                    G Codes:  Ilda Foil 05/16/2016, 11:04 AM

## 2016-05-16 NOTE — Care Management (Deleted)
Student nurse taught patient deep breathing exercises and how to properly use incentive spirometer.

## 2016-05-16 NOTE — Progress Notes (Signed)
Pt requested no chocolate shakes, but call placed to cafeteria requesting vanilla shakes only. Per cafeteria, they will replace this mornings, and make a note for future trays.   Request sent to pharmacy for a second can of thickener; as patient's current supply is running out.

## 2016-05-16 NOTE — Progress Notes (Signed)
Call received from Dr Pascal Lux, informing this nurse that he is currently completing documentation from J tube procedure. He did confirm reason for replacing the tube with single J lumen versus G/J, was to reduce the risk of clogging, due to frequent clogging complications; and very rarely requiring drainage via G tube at home, as daughter reports that as the only use for G port.   Information relayed to daughter, rather confirmed as this was previously presumed to be the reason. Daughter expressed understanding, has hopes the G port will not be needed.

## 2016-05-16 NOTE — Progress Notes (Signed)
PROGRESS NOTE  Shannon [name] EXB:[id number] DOB: Jan 19, 1924 DOA: 05/08/2016 PCP: Kirt Boys, DO   LOS: 8 days   Brief Narrative: 83 y.o.femalewith medical history significant of persistent atrial fibrillation on amiodarone, bradycardia, CKD stage III,hypothyroidism,non-obstructive coronary artery disease, type 2 diabetes, hypertension, chronic diastolic CHF, stroke, cerebrovascular disease, COPD, asthma, gastric outlet obstruction s/p prior J tube, anemia and hyponatremia, presented with shortness of breath and cough for worsening for about 2 weeks.  Assessment & Plan: Active Problems:   Persistent atrial fibrillation (HCC)   Hypothyroidism   CAP (community acquired pneumonia)   Chronic kidney insufficiency, stage 3 (moderate)   Hyponatremia   Diabetic polyneuropathy associated with type 2 diabetes mellitus (HCC)   Acute on chronic diastolic CHF (congestive heart failure), NYHA class 4 (HCC)   PEG (percutaneous endoscopic gastrostomy) status (HCC)   HCAP (healthcare-associated pneumonia)   Possible community acquired pneumonia - Patient is s/p complete course of 7 days of ceftriaxone and azithromycin - Respiratory virus panel was negative - Continue supportive care - Patient had worsening cough 2/12, and states that overall she feels worse with her breathing  Hypoxic respiratory failure - Amiodarone discontinued by cardiology due to concern for pulmonary toxicity - patient weaned off to room air and has been stable however when walking to PT today desatting to 80s, and quite symptomatic. Will change Lasix to IV x 1 on 2/13, recheck sats with ambulation in am.  H/o gastric outlet obstruction - With PEG-J tube, declogged by IR 2/7, now clogged again.  - PEG tube replaced 2/12 per IR  Acute on chronic diastolic congestive heart failure - Patient was initially on IV Lasix, this was converted to by mouth on 2/6, she lost ~ 6 lbs (141>135) and was converted to po, however she  remained persistently dyspneic and cardiology was consulted on 2/8, and IV Lasix was resumed 2/8. Continue IV Lasix 2/13 - 2-D echo done on 05/09/16 and showed ejection fraction of 60-65% - Patient has no chest pain, troponin not elevated. - Continue Imdur, hydralazine - Renal function has remained stable  Persistent atrial fibrillation / flutter - Continue diltiazem, Coumadin per pharmacy - Amiodarone discontinued as above  Acute on chronic hyponatremia likely in the setting of CHF - Serum sodium has remained stable at 128 >> 127 >> 127 >> 131>>132>>132 >> 134  Chronic kidney disease is stage III  - Monitor BMP and electrolytes. Repeat lab in the morning. - creatinine stable  Hypothyroidism  - Continue Synthroid - TSH 7.4, will need to be rechecked as an outpatient in ~ 3 weeks  Left leg pain, chronic in nature - Ultrasound negative for DVT  Goals of care discussion - DNR, thus with daughter at bedside regarding palliative, she has seen within the past but is not too keen on discussing with them again. I brought up the idea of hospice however she seems reluctant   DVT prophylaxis: Coumadin Code Status: DNR Family Communication: daughter bedside Disposition Plan: d/c 1-2 days once hypoxia improved  Consultants:   Cardiology  Procedures:   None   Antimicrobials:  Ceftriaxone and azithromycin 2/5 >> 2/12  Subjective: - eating breakfast, appears to be a little bit short-winded as morning  Objective: Vitals:   05/16/16 0747 05/16/16 0941 05/16/16 1012 05/16/16 1150  BP: (!) 152/50  (!) 153/56 135/60  Pulse: 66   66  Resp: 16   20  Temp: 99.1 F (37.3 C)   98.3 F (36.8 C)  TempSrc: Oral   Oral  SpO2: 95% 98%  100%  Weight:      Height:        Intake/Output Summary (Last 24 hours) at 05/16/16 1201 Last data filed at 05/16/16 1030  Gross per 24 hour  Intake             1600 ml  Output              601 ml  Net              999 ml   Filed Weights    05/14/16 0543 05/15/16 0702 05/16/16 0521  Weight: 61.9 kg (136 lb 6.4 oz) 62.1 kg (137 lb) 62.1 kg (137 lb)    Examination: Constitutional: NAD Vitals:   05/16/16 0747 05/16/16 0941 05/16/16 1012 05/16/16 1150  BP: (!) 152/50  (!) 153/56 135/60  Pulse: 66   66  Resp: 16   20  Temp: 99.1 F (37.3 C)   98.3 F (36.8 C)  TempSrc: Oral   Oral  SpO2: 95% 98%  100%  Weight:      Height:       ENMT: Mucous membranes are moist.  Neck: normal, supple, no masses Respiratory: coarse breath sounds, no wheezing, no crackles. Uses accessory muscles with breathing   Cardiovascular: Regular rate and rhythm, no murmurs / rubs / gallops. Trace LE edema. 2+ pedal pulses.  Abdomen: no tenderness. Bowel sounds positive.  Neurologic: non focal    Data Reviewed: I have personally reviewed following labs and imaging studies  CBC:  Recent Labs Lab 05/12/16 0350 05/16/16 0343  WBC 5.8 6.6  HGB 9.7* 9.9*  HCT 28.6* 29.8*  MCV 82.7 82.5  PLT 284 358   Basic Metabolic Panel:  Recent Labs Lab 05/13/16 1521 05/13/16 2340 05/14/16 0619 05/15/16 1027 05/16/16 0343  NA 131* 132* 132* 132* 134*  K 4.0 3.8 3.8 3.8 3.8  CL 93* 93* 95* 95* 94*  CO2 27 27 26 25 26   GLUCOSE 93 144* 142* 213* 193*  BUN 48* 51* 49* 52* 54*  CREATININE 1.31* 1.33* 1.33* 1.50* 1.46*  CALCIUM 9.3 9.3 9.5 9.3 9.4   Coagulation Profile:  Recent Labs Lab 05/12/16 0350 05/13/16 0433 05/14/16 0619 05/15/16 0415 05/16/16 0343  INR 1.47 1.73 1.89 2.17 2.33   BNP (last 3 results)  Recent Labs  05/08/16 1053  PROBNP 1,132*   CBG:  Recent Labs Lab 05/15/16 1107 05/15/16 1623 05/15/16 2115 05/16/16 0739 05/16/16 1152  GLUCAP 205* 81 170* 208* 154*   Thyroid Function Tests: No results for input(s): TSH, T4TOTAL, FREET4, T3FREE, THYROIDAB in the last 72 hours. Urine analysis:    Component Value Date/Time   COLORURINE YELLOW 05/10/2016 1539   APPEARANCEUR CLEAR 05/10/2016 1539   LABSPEC 1.009  05/10/2016 1539   PHURINE 7.0 05/10/2016 1539   GLUCOSEU NEGATIVE 05/10/2016 1539   HGBUR NEGATIVE 05/10/2016 1539   BILIRUBINUR NEGATIVE 05/10/2016 1539   KETONESUR NEGATIVE 05/10/2016 1539   PROTEINUR NEGATIVE 05/10/2016 1539   UROBILINOGEN 0.2 01/27/2015 2308   NITRITE NEGATIVE 05/10/2016 1539   LEUKOCYTESUR NEGATIVE 05/10/2016 1539   Sepsis Labs: Invalid input(s): PROCALCITONIN, LACTICIDVEN  Recent Results (from the past 240 hour(s))  Blood Culture (routine x 2)     Status: None   Collection Time: 05/08/16  4:30 PM  Result Value Ref Range Status   Specimen Description BLOOD LEFT FOREARM  Final   Special Requests IN PEDIATRIC BOTTLE 1CC  Final   Culture NO GROWTH 5 DAYS  Final  Report Status 05/13/2016 FINAL  Final  Blood Culture (routine x 2)     Status: None   Collection Time: 05/08/16  4:35 PM  Result Value Ref Range Status   Specimen Description BLOOD RIGHT HAND  Final   Special Requests BOTTLES DRAWN AEROBIC AND ANAEROBIC 5CC  Final   Culture NO GROWTH 5 DAYS  Final   Report Status 05/13/2016 FINAL  Final  MRSA PCR Screening     Status: None   Collection Time: 05/08/16  7:32 PM  Result Value Ref Range Status   MRSA by PCR NEGATIVE NEGATIVE Final    Comment:        The GeneXpert MRSA Assay (FDA approved for NASAL specimens only), is one component of a comprehensive MRSA colonization surveillance program. It is not intended to diagnose MRSA infection nor to guide or monitor treatment for MRSA infections.   Respiratory Panel by PCR     Status: None   Collection Time: 05/08/16  7:35 PM  Result Value Ref Range Status   Adenovirus NOT DETECTED NOT DETECTED Final   Coronavirus 229E NOT DETECTED NOT DETECTED Final   Coronavirus HKU1 NOT DETECTED NOT DETECTED Final   Coronavirus NL63 NOT DETECTED NOT DETECTED Final   Coronavirus OC43 NOT DETECTED NOT DETECTED Final   Metapneumovirus NOT DETECTED NOT DETECTED Final   Rhinovirus / Enterovirus NOT DETECTED NOT  DETECTED Final   Influenza A NOT DETECTED NOT DETECTED Final   Influenza B NOT DETECTED NOT DETECTED Final   Parainfluenza Virus 1 NOT DETECTED NOT DETECTED Final   Parainfluenza Virus 2 NOT DETECTED NOT DETECTED Final   Parainfluenza Virus 3 NOT DETECTED NOT DETECTED Final   Parainfluenza Virus 4 NOT DETECTED NOT DETECTED Final   Respiratory Syncytial Virus NOT DETECTED NOT DETECTED Final   Bordetella pertussis NOT DETECTED NOT DETECTED Final   Chlamydophila pneumoniae NOT DETECTED NOT DETECTED Final   Mycoplasma pneumoniae NOT DETECTED NOT DETECTED Final  Culture, sputum-assessment     Status: None   Collection Time: 05/09/16  4:01 AM  Result Value Ref Range Status   Specimen Description EXPECTORATED SPUTUM  Final   Special Requests NONE  Final   Sputum evaluation   Final    Sputum specimen not acceptable for testing.  Please recollect.   Gram Stain Report Called to,Read Back By and Verified With: R CULBREN,RN @0710  05/09/16 MKELLY,MLT   Report Status 05/09/2016 FINAL  Final      Radiology Studies: Ir Gj Tube Change  Result Date: 05/15/2016 Please refer to "Notes" to see consult details.  Dg Swallowing Func-speech Pathology  Result Date: 05/15/2016 Objective Swallowing Evaluation: Type of Study: MBS-Modified Barium Swallow Study Patient Details Name: Ariza Grieve MRN: [id number] Date of Birth: 1923/10/08 Today's Date: 05/15/2016 Time: SLP Start Time (ACUTE ONLY): 0955-SLP Stop Time (ACUTE ONLY): 1014 SLP Time Calculation (min) (ACUTE ONLY): 19 min Past Medical History: Past Medical History: Diagnosis Date . Anemia   previous blood transfusions . Arthritis   "all over" . Asthma  . Bradycardia   requiring previous d/c of BB and reduction of amiodarone . CAD (coronary artery disease)   nonobstructive per notes . Chronic diastolic CHF (congestive heart failure) (HCC)  . CKD (chronic kidney disease), stage III  . Complication of blood transfusion   "got the wrong blood type at [facility] in ~  2015; no adverse reaction that we are aware of"/daughter, [name] (01/27/2016) . COPD (chronic obstructive pulmonary disease) (HCC)  . Depression   "light case" .  DVT (deep venous thrombosis) (HCC) 01/2016  a. LLE DVT 01/2016 - switched from Eliquis to Coumadin. . Gastric stenosis   a. s/p stomach tube . GERD (gastroesophageal reflux disease)  . History of blood transfusion   "several" (01/27/2016) . History of stomach ulcers  . Hyperlipidemia  . Hypertension  . Hypothyroidism  . Paraesophageal hernia  . Perforated gastric ulcer (HCC)  . Seasonal allergies  . SIADH (syndrome of inappropriate ADH production) (HCC)   [name] 01/10/2015 . Small bowel obstruction   "I don't know how many" (01/11/2015) . Stroke (HCC)   "light one" . Type II diabetes mellitus (HCC)   "related to prednisone use  for > 20 yr; once predinose stopped; no more DM RX" (01/27/2016) . Ventral hernia with bowel obstruction  Past Surgical History: Past Surgical History: Procedure Laterality Date . CATARACT EXTRACTION W/ INTRAOCULAR LENS  IMPLANT, BILATERAL   . CHOLECYSTECTOMY OPEN   . COLECTOMY   . ESOPHAGOGASTRODUODENOSCOPY N/A 01/19/2014  Procedure: ESOPHAGOGASTRODUODENOSCOPY (EGD);  Surgeon: Hilarie Fredrickson, MD;  Location: [facility];  Service: Endoscopy;  Laterality: N/A; . ESOPHAGOGASTRODUODENOSCOPY N/A 01/20/2014  Procedure: ESOPHAGOGASTRODUODENOSCOPY (EGD);  Surgeon: Hilarie Fredrickson, MD;  Location: [facility];  Service: Endoscopy;  Laterality: N/A; . ESOPHAGOGASTRODUODENOSCOPY N/A 03/19/2014  Procedure: ESOPHAGOGASTRODUODENOSCOPY (EGD);  Surgeon: Rachael Fee, MD;  Location: [facility];  Service: Endoscopy;  Laterality: N/A; . ESOPHAGOGASTRODUODENOSCOPY N/A 07/08/2015  Procedure: ESOPHAGOGASTRODUODENOSCOPY (EGD);  Surgeon: Sherrilyn Rist, MD;  Location: [facility] END[facility]COPY;  Service: Endoscopy;  Laterality: N/A; . ESOPHAGOGASTRODUODENOSCOPY (EGD) WITH PROPOFOL N/A 09/15/2015  Procedure: ESOPHAGOGASTRODUODENOSCOPY (EGD) WITH PROPOFOL;  Surgeon:  Ruffin Frederick, MD;  Location: [facility] ENDOSCOPY;  Service: Gastroenterology;  Laterality: N/A; . GASTROJEJUNOSTOMY    hx/notes 01/10/2015 . GASTROJEJUNOSTOMY N/A 09/23/2015  Procedure: OPEN GASTROJEJUNOSTOMY TUBE PLACEMENT;  Surgeon: De Blanch Kinsinger, MD;  Location: [facility] ORS;  Service: General;  Laterality: N/A; . GLAUCOMA SURGERY Bilateral  . HERNIA REPAIR  2015 . IR GENERIC HISTORICAL  01/07/2016  IR GJ TUBE CHANGE 01/07/2016 Malachy Moan, MD [facility] . IR GENERIC HISTORICAL  01/27/2016  IR MECH REMOV OBSTRUC MAT ANY COLON TUBE W/FLUORO 01/27/2016 Richarda Overlie, MD [facility] . IR GENERIC HISTORICAL  02/07/2016  IR PATIENT EVAL TECH 0-60 MINS Darrell K Allred, PA-C [facility] . IR GENERIC HISTORICAL  02/08/2016  IR GJ TUBE CHANGE 02/08/2016 Berdine Dance, MD [facility] . IR GENERIC HISTORICAL  01/06/2016  IR GJ TUBE CHANGE 01/06/2016 [facility]-RAD OUT REF . IR GENERIC HISTORICAL  05/02/2016  IR CM INJ ANY COLONIC TUBE W/FLUORO 05/02/2016 Oley Balm, MD [facility] . LAPAROTOMY N/A 01/20/2015  Procedure: EXPLORATORY LAPAROTOMY;  Surgeon: Abigail Miyamoto, MD;  Location: [facility] OR;  Service: General;  Laterality: N/A; . LYSIS OF ADHESION N/A 01/20/2015  Procedure: LYSIS OF ADHESIONS < 1 HOUR;  Surgeon: Abigail Miyamoto, MD;  Location: [facility] OR;  Service: General;  Laterality: N/A; . TONSILLECTOMY   . TUBAL LIGATION   . VENTRAL HERNIA REPAIR  2015  incarcerated ventral hernia ([facility] 09/2013)/notes 01/10/2015 HPI: 83 y.o. female with medical history significant of persistent atrial fibrillation on amiodarone, bradycardia, CKD stage III, hypothyroidism, non-obstructive coronary artery disease, type 2 diabetes, hypertension, chronic diastolic CHF, stroke, cerebrovascular disease, COPD, asthma, gastric outlet obstruction s/p prior J tube, anemia and hyponatremia, presented with shortness of breath and cough for worsening for about 2 weeks. Admitted with possible pneumonia, hypoxic respiratory failure. At baseline consumes  liquid diet thickened to nectar consistency with supplemental nutrition via J tube. Seen by SLP  during multiple prior admissions. Pt had a MBS on 11/09/15 indicating penetration of thin liquids to level of VFs which pt was able to sense and clear most of material. Also noted prominence at CP segment. Recommendation at that time was nectar thick liquids/ full liquid diet although there were pharyngeal residuals on this consistency. During most recent admission 03/2016, recommendations were thin liquids (full liquids), medications whole with liquid. Instrumental testing declined by patient at that time. Chest x-ray 05/11/16 showed Interval development of alveolar opacity in the left lung likely in the lingula which may reflect pneumonia or atelectasis, new small left pleural effusion. Subjective: Pt semi-upright in bed, consuming clear liquids from tray which she thickens herself to nectar consistency Assessment / Plan / Recommendation [facility] IP CLINICAL IMPRESSIONS 05/15/2016 Therapy Diagnosis Mild oral phase dysphagia;Mild pharyngeal phase dysphagia Clinical Impression Patient presents with mild oropharyngeal dysphagia characterized by delayed swallow initiation resulting in silent aspiration of thin liquids. Additionally, patient with mild vallecular residuals post swallow due to base of tongue weakness leading to one episode of silent aspiration of residuals post swallow. Use of chin tuck did prevent penetration/aspiration of thin liquids during todays study. Would consider upgrade to thin liquids once patient can complete chin tuck consistently. Note that long h/o esophageal dysphagia does also increase aspiration risk. Will continue to f/u.  Impact on safety and function Moderate aspiration risk   [facility] IP TREATMENT RECOMMENDATION 05/15/2016 Treatment Recommendations Therapy as outlined in treatment plan below   Prognosis 05/15/2016 Prognosis for Safe Diet Advancement Good Barriers to Reach Goals Time post onset  Barriers/Prognosis Comment -- [facility] IP DIET RECOMMENDATION 05/15/2016 SLP Diet Recommendations Nectar thick liquid Liquid Administration via Cup;No straw Medication Administration Crushed with puree Compensations Slow rate;Small sips/bites Postural Changes Seated upright at 90 degrees;Remain semi-upright after after feeds/meals (Comment)   [facility] IP OTHER RECOMMENDATIONS 05/15/2016 Recommended Consults -- Oral Care Recommendations Oral care BID Other Recommendations Order thickener from pharmacy;Prohibited food (jello, ice cream, thin soups)   [facility] IP FOLLOW UP RECOMMENDATIONS 05/15/2016 Follow up Recommendations Skilled Nursing facility   [facility] IP FREQUENCY AND DURATION 05/15/2016 Speech Therapy Frequency (ACUTE ONLY) min 2x/week Treatment Duration 2 weeks      [facility] IP ORAL PHASE 05/15/2016 Oral Phase WFL Oral - Pudding Teaspoon -- Oral - Pudding Cup -- Oral - Honey Teaspoon -- Oral - Honey Cup -- Oral - Nectar Teaspoon -- Oral - Nectar Cup -- Oral - Nectar Straw -- Oral - Thin Teaspoon -- Oral - Thin Cup -- Oral - Thin Straw -- Oral - Puree -- Oral - Mech Soft -- Oral - Regular -- Oral - Multi-Consistency -- Oral - Pill -- Oral Phase - Comment --  [facility] IP PHARYNGEAL PHASE 05/15/2016 Pharyngeal Phase Impaired Pharyngeal- Pudding Teaspoon -- Pharyngeal -- Pharyngeal- Pudding Cup -- Pharyngeal -- Pharyngeal- Honey Teaspoon -- Pharyngeal -- Pharyngeal- Honey Cup -- Pharyngeal -- Pharyngeal- Nectar Teaspoon Delayed swallow initiation-vallecula;Reduced tongue base retraction;Pharyngeal residue - valleculae Pharyngeal -- Pharyngeal- Nectar Cup Delayed swallow initiation-vallecula;Reduced tongue base retraction;Pharyngeal residue - valleculae Pharyngeal -- Pharyngeal- Nectar Straw -- Pharyngeal -- Pharyngeal- Thin Teaspoon Delayed swallow initiation-vallecula;Reduced tongue base retraction;Pharyngeal residue - valleculae Pharyngeal -- Pharyngeal- Thin Cup Delayed swallow initiation-vallecula;Reduced tongue base retraction;Pharyngeal  residue - valleculae;Delayed swallow initiation-pyriform sinuses;Penetration/Aspiration before swallow;Penetration/Apiration after swallow Pharyngeal Material enters airway, passes BELOW cords without attempt by patient to eject out (silent aspiration) Pharyngeal- Thin Straw Reduced tongue base retraction;Pharyngeal residue - valleculae;Delayed swallow initiation-pyriform sinuses;Delayed swallow initiation-vallecula;Penetration/Aspiration before swallow Pharyngeal Material enters airway, passes  BELOW cords without attempt by patient to eject out (silent aspiration) Pharyngeal- Puree Delayed swallow initiation-vallecula;Reduced tongue base retraction;Pharyngeal residue - valleculae Pharyngeal -- Pharyngeal- Mechanical Soft -- Pharyngeal -- Pharyngeal- Regular -- Pharyngeal -- Pharyngeal- Multi-consistency -- Pharyngeal -- Pharyngeal- Pill Pharyngeal residue - valleculae;Reduced tongue base retraction Pharyngeal -- Pharyngeal Comment --  [facility] IP CERVICAL ESOPHAGEAL PHASE 05/15/2016 Cervical Esophageal Phase Impaired Pudding Teaspoon -- Pudding Cup -- Honey Teaspoon -- Honey Cup -- Nectar Teaspoon -- Nectar Cup -- Nectar Straw -- Thin Teaspoon -- Thin Cup -- Thin Straw -- Puree -- Mechanical Soft -- Regular -- Multi-consistency -- Pill -- Cervical Esophageal Comment -- [facility] IP GO 11/09/2015 Functional Assessment Tool Used clinical judgment Functional Limitations Swallowing Swallow Current Status (Z6109) CJ Swallow Goal Status (U0454) CI Swallow Discharge Status (U9811) (None) Motor Speech Current Status (B1478) (None) Motor Speech Goal Status (G9562) (None) Motor Speech Goal Status (Z3086) (None) Spoken Language Comprehension Current Status (V7846) (None) Spoken Language Comprehension Goal Status (N6295) (None) Spoken Language Comprehension Discharge Status (M8413) (None) Spoken Language Expression Current Status (K4401) (None) Spoken Language Expression Goal Status (U2725) (None) Spoken Language Expression Discharge Status  [phone]) (None) Attention Current Status (I3474) (None) Attention Goal Status (Q5956) (None) Attention Discharge Status (L8756) (None) Memory Current Status (E3329) (None) Memory Goal Status (J1884) (None) Memory Discharge Status (Z6606) (None) Voice Current Status (T0160) (None) Voice Goal Status (F0932) (None) Voice Discharge Status (T5573) (None) Other Speech-Language Pathology Functional Limitation (U2025) (None) Other Speech-Language Pathology Functional Limitation Goal Status (K2706) (None) Other Speech-Language Pathology Functional Limitation Discharge Status [phone]) (None) Ferdinand Lango MA, CCC-SLP [phone] McCoy Leah Meryl 05/15/2016, 10:45 AM                Scheduled Meds: . atorvastatin  10 mg Oral Daily  . bacitracin-polymyxin b   Both Eyes BID  . bisoprolol  5 mg Oral Daily  . calcium-vitamin D  1 tablet Oral Q breakfast  . chlorhexidine  15 mL Mouth/Throat BID  . cycloSPORINE  1 drop Both Eyes BID  . diltiazem  120 mg Oral Daily  . feeding supplement (ENSURE ENLIVE)  237 mL Oral Q24H  . feeding supplement (PRO-STAT SUGAR FREE 64)  30 mL Per Tube Daily  . fluticasone  2 spray Each Nare Daily  . furosemide  40 mg Intravenous Once  . gabapentin  300 mg Oral QHS  . hydrALAZINE  50 mg Oral TID  . insulin aspart  0-9 Units Subcutaneous TID WC  . ipratropium-albuterol  3 mL Nebulization TID  . isosorbide mononitrate  30 mg Oral Daily  . latanoprost  1 drop Both Eyes QHS  . levothyroxine  137 mcg Oral QAC breakfast  . loratadine  10 mg Oral Daily  . multivitamin with minerals  1 tablet Oral Daily  . pantoprazole  40 mg Oral BID  . polyvinyl alcohol  1 drop Both Eyes QID  . sertraline  100 mg Oral Daily  . Warfarin - Pharmacist Dosing Inpatient   Does not apply q1800   Continuous Infusions: . feeding supplement (OSMOLITE 1.5 CAL) Stopped (05/16/16 0900)   Pamella Pert, MD, PhD [facility] Pager [phone]  If 7PM-7AM, please contact  night-coverage [email or web address] Password [email or web address] 05/16/2016, 12:01 PM

## 2016-05-16 NOTE — Progress Notes (Signed)
ANTICOAGULATION CONSULT NOTE - Follow Up Consult  Pharmacy Consult for Coumadin Indication: afib/flutter  Allergies  Allergen Reactions  . Penicillins Itching and Rash    Has patient had a PCN reaction causing immediate rash, facial/tongue/throat swelling, SOB or lightheadedness with hypotension: Yes Has patient had a PCN reaction causing severe rash involving mucus membranes or skin necrosis: Unk Has patient had a PCN reaction that required hospitalization: Unk Has patient had a PCN reaction occurring within the last 10 years: Unk If all of the above answers are "NO", then may proceed with Cephalosporin use.     Patient Measurements: Height: 5\' 6"  (167.6 cm) Weight: 137 lb (62.1 kg) (a scale) IBW/kg (Calculated) : 59.3 Heparin Dosing Weight:    Vital Signs: Temp: 98.3 F (36.8 C) (02/13 1150) Temp Source: Oral (02/13 1150) BP: 135/60 (02/13 1150) Pulse Rate: 66 (02/13 1150)  Labs:  Recent Labs  05/14/16 0619 05/15/16 0415 05/15/16 1027 05/16/16 0343  HGB  --   --   --  9.9*  HCT  --   --   --  29.8*  PLT  --   --   --  358  LABPROT 21.9* 24.6*  --  26.0*  INR 1.89 2.17  --  2.33  CREATININE 1.33*  --  1.50* 1.46*    Estimated Creatinine Clearance: 24 mL/min (by C-G formula based on SCr of 1.46 mg/dL (H)).  Assessment:  Anticoag: Warf PTA for AFib /flutter(goal 2-3) to resume per pharmacy. INR subtherapeutic at 1.40 on admit (low last outpatient visits as well). Amio stopped 2/8, ABx to stop 2/12. Missed doses 2/5 and 2/6 due to clogged tube INR today = 2.33. CBC stable. - Dose PTA 2.5mg  Mon, 3.75mg  all other days.  Goal of Therapy:  INR 2-3 Monitor platelets by anticoagulation protocol: Yes   Plan:  Repeat Warfarin 5 mg po x 1 Daily INR, s/sx bleeding   Zara Wendt S. Alford Highland, PharmD, BCPS Clinical Staff Pharmacist Pager [phone]  [place], [place] 05/16/2016,1:06 PM

## 2016-05-16 NOTE — Progress Notes (Signed)
Page sent to MD in IR today, in effort to find information regarding yesterdays procedure; as her tube type was changed, and there are no procedure notes in the chart.

## 2016-05-16 NOTE — Procedures (Signed)
Successful fluoroscopic guided conversion of existing gastrojejunostomy catheter to a new 22-French jejunostomy tube.   This conversion was performed a secondary to recurrent occlusion of the jejunostomy lumen as well as lack of frequent usage of the gastrostomy lumen.   The lumen of the new 22 French jejunostomy catheter is much larger in caliber and therefore has a decreased risk of occlusion.   The new jejunostomy tube is ready for immediate use.  Ronny Bacon, MD Pager #: [phone]

## 2016-05-16 NOTE — Progress Notes (Signed)
Taught Pt how to properly use incentive speromitor, deep breathing  techniques and relaxation techniques.

## 2016-05-17 ENCOUNTER — Other Ambulatory Visit: Payer: Self-pay | Admitting: Internal Medicine

## 2016-05-17 LAB — PROTIME-INR
INR: 2.67
Prothrombin Time: 29 seconds — ABNORMAL HIGH (ref 11.4–15.2)

## 2016-05-17 LAB — BASIC METABOLIC PANEL
Anion gap: 14 (ref 5–15)
BUN: 54 mg/dL — ABNORMAL HIGH (ref 6–20)
CO2: 24 mmol/L (ref 22–32)
Calcium: 9.2 mg/dL (ref 8.9–10.3)
Chloride: 93 mmol/L — ABNORMAL LOW (ref 101–111)
Creatinine, Ser: 1.31 mg/dL — ABNORMAL HIGH (ref 0.44–1.00)
GFR calc Af Amer: 40 mL/min — ABNORMAL LOW (ref >60)
GFR calc non Af Amer: 35 mL/min — ABNORMAL LOW (ref >60)
Glucose, Bld: 193 mg/dL — ABNORMAL HIGH (ref 65–99)
Potassium: 3.7 mmol/L (ref 3.5–5.1)
Sodium: 131 mmol/L — ABNORMAL LOW (ref 135–145)

## 2016-05-17 LAB — GLUCOSE, CAPILLARY
Glucose-Capillary: 202 mg/dL — ABNORMAL HIGH (ref 65–99)
Glucose-Capillary: 160 mg/dL — ABNORMAL HIGH (ref 65–99)

## 2016-05-17 MED ORDER — WARFARIN SODIUM 7.5 MG PO TABS: 3.7500 mg | ORAL_TABLET | ORAL | Status: DC

## 2016-05-17 MED ORDER — FUROSEMIDE 40 MG PO TABS: 40.0000 mg | ORAL_TABLET | Freq: Every day | ORAL | 0 refills | Status: DC

## 2016-05-17 MED ORDER — WARFARIN SODIUM 2.5 MG PO TABS: 2.5000 mg | ORAL_TABLET | ORAL | Status: DC

## 2016-05-17 MED ORDER — BISOPROLOL FUMARATE 5 MG PO TABS: 5.0000 mg | ORAL_TABLET | Freq: Every day | ORAL | 0 refills | Status: DC

## 2016-05-17 MED ORDER — GABAPENTIN 300 MG PO CAPS: 300.0000 mg | ORAL_CAPSULE | Freq: Every day | ORAL | 0 refills | Status: DC

## 2016-05-17 NOTE — Progress Notes (Signed)
ANTICOAGULATION CONSULT NOTE - Follow Up Consult  Pharmacy Consult for Coumadin Indication: afib/flutter  Allergies  Allergen Reactions  . Penicillins Itching and Rash    Has patient had a PCN reaction causing immediate rash, facial/tongue/throat swelling, SOB or lightheadedness with hypotension: Yes Has patient had a PCN reaction causing severe rash involving mucus membranes or skin necrosis: Unk Has patient had a PCN reaction that required hospitalization: Unk Has patient had a PCN reaction occurring within the last 10 years: Unk If all of the above answers are "NO", then may proceed with Cephalosporin use.     Patient Measurements: Height: 5\' 6"  (167.6 cm) Weight: 137 lb 4.8 oz (62.3 kg) IBW/kg (Calculated) : 59.3 Heparin Dosing Weight:    Vital Signs: Temp: 97.7 F (36.5 C) (02/14 0553) Temp Source: Oral (02/14 0553) BP: 148/87 (02/14 0958) Pulse Rate: 61 (02/14 0553)  Labs:  Recent Labs  05/15/16 0415 05/15/16 1027 05/16/16 0343 05/17/16 0407  HGB  --   --  9.9*  --   HCT  --   --  29.8*  --   PLT  --   --  358  --   LABPROT 24.6*  --  26.0* 29.0*  INR 2.17  --  2.33 2.67  CREATININE  --  1.50* 1.46* 1.31*    Estimated Creatinine Clearance: 26.7 mL/min (by C-G formula based on SCr of 1.31 mg/dL (H)).   Assessment:  Anticoag: Warf PTA for AFib /flutter(goal 2-3) to resume per pharmacy. INR subtherapeutic at 1.40 on admit (low last outpatient visits as well). Amio stopped 2/8, ABx to stop 2/12. Missed doses 2/5 and 2/6 due to clogged tube INR today = 2.67. CBC stable. - Dose PTA 2.5mg  Mon, 3.75mg  all other days.  Goal of Therapy:  INR 2-3 Monitor platelets by anticoagulation protocol: Yes   Plan:  Resume home Coumadin regimen 2.5mg  Mon with 3.75mg  other days. Daily INR, s/sx bleeding   Saabir Blyth S. Alford Highland, PharmD, BCPS Clinical Staff Pharmacist Pager [phone]  Eilene Ghazi Stillinger 05/17/2016,12:00 PM

## 2016-05-17 NOTE — Progress Notes (Signed)
Speech Language Pathology Treatment: Dysphagia  Patient Details Name: Shannon [name] MRN: [id number] DOB: 11-29-1923 Today's Date: 05/17/2016 Time: 6269-4854 SLP Time Calculation (min) (ACUTE ONLY): 13 min  Assessment / Plan / Recommendation Clinical Impression  Pt tolerating diet well.  Preparing for D/C home.  Able to thicken liquids independently.  Taught chin tuck for use with thin liquids (not necessary with nectar thick) which assists with airway protection per pt's MBS. Pt able to execute independently. No further SLP f/u needed.  D/W pt, family.     HPI HPI: 83 y.o. female with medical history significant of persistent atrial fibrillation on amiodarone, bradycardia, CKD stage III, hypothyroidism, non-obstructive coronary artery disease, type 2 diabetes, hypertension, chronic diastolic CHF, stroke, cerebrovascular disease, COPD, asthma, gastric outlet obstruction s/p prior J tube, anemia and hyponatremia, presented with shortness of breath and cough for worsening for about 2 weeks. Admitted with possible pneumonia, hypoxic respiratory failure. At baseline consumes liquid diet thickened to nectar consistency with supplemental nutrition via J tube. Seen by SLP during multiple prior admissions. Pt had a MBS on 11/09/15 indicating penetration of thin liquids to level of VFs which pt was able to sense and clear most of material. Also noted prominence at CP segment. Recommendation at that time was nectar thick liquids/ full liquid diet although there were pharyngeal residuals on this consistency. During most recent admission 03/2016, recommendations were thin liquids (full liquids), medications whole with liquid. Instrumental testing declined by patient at that time. Chest x-ray 05/11/16 showed Interval development of alveolar opacity in the left lung likely in the lingula which may reflect pneumonia or atelectasis, new small left pleural effusion.      SLP Plan  Discharge SLP treatment due to (comment)      Recommendations  Diet recommendations: Thin liquid (with chin tuck);Nectar-thick liquid Liquids provided via: Cup;Straw Medication Administration: Whole meds with liquid Supervision: Patient able to self feed Compensations: Slow rate;Small sips/bites Postural Changes and/or Swallow Maneuvers: Chin tuck                Plan: Discharge SLP treatment due to (comment)       GO                Juan Quam Laurice 05/17/2016, 2:04 PM

## 2016-05-17 NOTE — Progress Notes (Signed)
Per Pt, pt ambulated very well with increased strength today compared to previous sessions. Pt was able to maintain her oxygen saturation in the 90s, not dropping below 93% per PT

## 2016-05-17 NOTE — Progress Notes (Signed)
Patient slept during the night. Complained of coughing, PRN given and effective. No other complaints. Will continue to monitor.   Jamee Keach, RN

## 2016-05-17 NOTE — Discharge Summary (Signed)
catheter was advanced through the gastrojejunostomy track with tip ultimately terminating within the proximal jejunum. The retention balloon was inflated and the balloon was cinched. Contrast was injected and several spot fluoroscopic images were obtained in various obliquities confirming intraluminal positioning. The patient tolerated the procedure well without immediate postprocedural complication. IMPRESSION: Successful fluoroscopic guided conversion of existing gastrojejunostomy catheter to a new 22-French jejunostomy tube. This conversion was performed a secondary to recurrent occlusion of the jejunostomy lumen as well as lack of frequent usage of the gastrostomy lumen. The lumen of the new 22 French jejunostomy catheter is much larger in caliber and therefore has a decreased risk of occlusion. The new jejunostomy tube is ready for immediate use. Electronically Signed   By: Sandi Mariscal M.D.   On: 05/16/2016 15:40   Ir Gj Tube Change  Result Date: 05/16/2016 Please refer to "Notes" to see consult details.  Ir Cm Inj Any Colonic Tube W/fluoro  Result Date: 05/02/2016 INDICATION: Feeding difficulties, catheter dislodgement. Previous GJ exchange 02/08/2016. EXAM: GI TUBE INJECTION FLUOROSCOPY TIME:  2 minutes 40 second, 25 mGy COMPLICATIONS: None immediate. PROCEDURE: Informed written consent was obtained from the patient after a thorough discussion of the procedural risks,  benefits and alternatives. All questions were addressed. Sterile Technique was utilized including . A timeout was performed prior to the initiation of the procedure. Initial fluoroscopic inspection demonstrated good position of the retention balloon in the region the gastric lumen. The jejunostomy limb extends beyond the ligament of Treitz. The weighted tip appears folded back upon the catheter. Injection confirms patency of the jejunal limb. An angled stiff glidewire was advanced through the jejunal liMb to straighten the apparent kink in the distal catheter. Final injection shows good position of the catheter, no kink, tip beyond the ligament of Treitz, widely patent, okay for routine use. IMPRESSION: Dual-lumen gastrojejunostomy catheter in good position, okay for routine use. Electronically Signed   By: Lucrezia Europe M.D.   On: 05/02/2016 15:26   Dg Swallowing Func-speech Pathology  Result Date: 05/15/2016 Objective Swallowing Evaluation: Type of Study: MBS-Modified Barium Swallow Study Patient Details Name: Shannon [name] MRN: [id number] Date of Birth: December 01, 1923 Today's Date: 05/15/2016 Time: SLP Start Time (ACUTE ONLY): 0955-SLP Stop Time (ACUTE ONLY): 1014 SLP Time Calculation (min) (ACUTE ONLY): 19 min Past Medical History: Past Medical History: Diagnosis Date . Anemia   previous blood transfusions . Arthritis   "all over" . Asthma  . Bradycardia   requiring previous d/c of BB and reduction of amiodarone . CAD (coronary artery disease)   nonobstructive per notes . Chronic diastolic CHF (congestive heart failure) ([place])  . CKD (chronic kidney disease), stage III  . Complication of blood transfusion   "got the wrong blood type at [facility] in ~ 2015; no adverse reaction that we are aware of"/daughter, [name] (01/27/2016) . COPD (chronic obstructive pulmonary disease) ([place])  . Depression   "light case" . DVT (deep venous thrombosis) ([place]) 01/2016  a. LLE DVT 01/2016 - switched from Eliquis to Coumadin. . Gastric  stenosis   a. s/p stomach tube . GERD (gastroesophageal reflux disease)  . History of blood transfusion   "several" (01/27/2016) . History of stomach ulcers  . Hyperlipidemia  . Hypertension  . Hypothyroidism  . Paraesophageal hernia  . Perforated gastric ulcer ([place])  . Seasonal allergies  . SIADH (syndrome of inappropriate ADH production) ([place])   [name] 01/10/2015 . Small bowel obstruction   "I don't know how many" (01/11/2015) . Stroke [facility])   "  catheter was advanced through the gastrojejunostomy track with tip ultimately terminating within the proximal jejunum. The retention balloon was inflated and the balloon was cinched. Contrast was injected and several spot fluoroscopic images were obtained in various obliquities confirming intraluminal positioning. The patient tolerated the procedure well without immediate postprocedural complication. IMPRESSION: Successful fluoroscopic guided conversion of existing gastrojejunostomy catheter to a new 22-French jejunostomy tube. This conversion was performed a secondary to recurrent occlusion of the jejunostomy lumen as well as lack of frequent usage of the gastrostomy lumen. The lumen of the new 22 French jejunostomy catheter is much larger in caliber and therefore has a decreased risk of occlusion. The new jejunostomy tube is ready for immediate use. Electronically Signed   By: Sandi Mariscal M.D.   On: 05/16/2016 15:40   Ir Gj Tube Change  Result Date: 05/16/2016 Please refer to "Notes" to see consult details.  Ir Cm Inj Any Colonic Tube W/fluoro  Result Date: 05/02/2016 INDICATION: Feeding difficulties, catheter dislodgement. Previous GJ exchange 02/08/2016. EXAM: GI TUBE INJECTION FLUOROSCOPY TIME:  2 minutes 40 second, 25 mGy COMPLICATIONS: None immediate. PROCEDURE: Informed written consent was obtained from the patient after a thorough discussion of the procedural risks,  benefits and alternatives. All questions were addressed. Sterile Technique was utilized including . A timeout was performed prior to the initiation of the procedure. Initial fluoroscopic inspection demonstrated good position of the retention balloon in the region the gastric lumen. The jejunostomy limb extends beyond the ligament of Treitz. The weighted tip appears folded back upon the catheter. Injection confirms patency of the jejunal limb. An angled stiff glidewire was advanced through the jejunal liMb to straighten the apparent kink in the distal catheter. Final injection shows good position of the catheter, no kink, tip beyond the ligament of Treitz, widely patent, okay for routine use. IMPRESSION: Dual-lumen gastrojejunostomy catheter in good position, okay for routine use. Electronically Signed   By: Lucrezia Europe M.D.   On: 05/02/2016 15:26   Dg Swallowing Func-speech Pathology  Result Date: 05/15/2016 Objective Swallowing Evaluation: Type of Study: MBS-Modified Barium Swallow Study Patient Details Name: Shannon [name] MRN: [id number] Date of Birth: December 01, 1923 Today's Date: 05/15/2016 Time: SLP Start Time (ACUTE ONLY): 0955-SLP Stop Time (ACUTE ONLY): 1014 SLP Time Calculation (min) (ACUTE ONLY): 19 min Past Medical History: Past Medical History: Diagnosis Date . Anemia   previous blood transfusions . Arthritis   "all over" . Asthma  . Bradycardia   requiring previous d/c of BB and reduction of amiodarone . CAD (coronary artery disease)   nonobstructive per notes . Chronic diastolic CHF (congestive heart failure) ([place])  . CKD (chronic kidney disease), stage III  . Complication of blood transfusion   "got the wrong blood type at [facility] in ~ 2015; no adverse reaction that we are aware of"/daughter, [name] (01/27/2016) . COPD (chronic obstructive pulmonary disease) ([place])  . Depression   "light case" . DVT (deep venous thrombosis) ([place]) 01/2016  a. LLE DVT 01/2016 - switched from Eliquis to Coumadin. . Gastric  stenosis   a. s/p stomach tube . GERD (gastroesophageal reflux disease)  . History of blood transfusion   "several" (01/27/2016) . History of stomach ulcers  . Hyperlipidemia  . Hypertension  . Hypothyroidism  . Paraesophageal hernia  . Perforated gastric ulcer ([place])  . Seasonal allergies  . SIADH (syndrome of inappropriate ADH production) ([place])   [name] 01/10/2015 . Small bowel obstruction   "I don't know how many" (01/11/2015) . Stroke [facility])   "  catheter was advanced through the gastrojejunostomy track with tip ultimately terminating within the proximal jejunum. The retention balloon was inflated and the balloon was cinched. Contrast was injected and several spot fluoroscopic images were obtained in various obliquities confirming intraluminal positioning. The patient tolerated the procedure well without immediate postprocedural complication. IMPRESSION: Successful fluoroscopic guided conversion of existing gastrojejunostomy catheter to a new 22-French jejunostomy tube. This conversion was performed a secondary to recurrent occlusion of the jejunostomy lumen as well as lack of frequent usage of the gastrostomy lumen. The lumen of the new 22 French jejunostomy catheter is much larger in caliber and therefore has a decreased risk of occlusion. The new jejunostomy tube is ready for immediate use. Electronically Signed   By: Sandi Mariscal M.D.   On: 05/16/2016 15:40   Ir Gj Tube Change  Result Date: 05/16/2016 Please refer to "Notes" to see consult details.  Ir Cm Inj Any Colonic Tube W/fluoro  Result Date: 05/02/2016 INDICATION: Feeding difficulties, catheter dislodgement. Previous GJ exchange 02/08/2016. EXAM: GI TUBE INJECTION FLUOROSCOPY TIME:  2 minutes 40 second, 25 mGy COMPLICATIONS: None immediate. PROCEDURE: Informed written consent was obtained from the patient after a thorough discussion of the procedural risks,  benefits and alternatives. All questions were addressed. Sterile Technique was utilized including . A timeout was performed prior to the initiation of the procedure. Initial fluoroscopic inspection demonstrated good position of the retention balloon in the region the gastric lumen. The jejunostomy limb extends beyond the ligament of Treitz. The weighted tip appears folded back upon the catheter. Injection confirms patency of the jejunal limb. An angled stiff glidewire was advanced through the jejunal liMb to straighten the apparent kink in the distal catheter. Final injection shows good position of the catheter, no kink, tip beyond the ligament of Treitz, widely patent, okay for routine use. IMPRESSION: Dual-lumen gastrojejunostomy catheter in good position, okay for routine use. Electronically Signed   By: Lucrezia Europe M.D.   On: 05/02/2016 15:26   Dg Swallowing Func-speech Pathology  Result Date: 05/15/2016 Objective Swallowing Evaluation: Type of Study: MBS-Modified Barium Swallow Study Patient Details Name: Shannon [name] MRN: [id number] Date of Birth: December 01, 1923 Today's Date: 05/15/2016 Time: SLP Start Time (ACUTE ONLY): 0955-SLP Stop Time (ACUTE ONLY): 1014 SLP Time Calculation (min) (ACUTE ONLY): 19 min Past Medical History: Past Medical History: Diagnosis Date . Anemia   previous blood transfusions . Arthritis   "all over" . Asthma  . Bradycardia   requiring previous d/c of BB and reduction of amiodarone . CAD (coronary artery disease)   nonobstructive per notes . Chronic diastolic CHF (congestive heart failure) ([place])  . CKD (chronic kidney disease), stage III  . Complication of blood transfusion   "got the wrong blood type at [facility] in ~ 2015; no adverse reaction that we are aware of"/daughter, [name] (01/27/2016) . COPD (chronic obstructive pulmonary disease) ([place])  . Depression   "light case" . DVT (deep venous thrombosis) ([place]) 01/2016  a. LLE DVT 01/2016 - switched from Eliquis to Coumadin. . Gastric  stenosis   a. s/p stomach tube . GERD (gastroesophageal reflux disease)  . History of blood transfusion   "several" (01/27/2016) . History of stomach ulcers  . Hyperlipidemia  . Hypertension  . Hypothyroidism  . Paraesophageal hernia  . Perforated gastric ulcer ([place])  . Seasonal allergies  . SIADH (syndrome of inappropriate ADH production) ([place])   [name] 01/10/2015 . Small bowel obstruction   "I don't know how many" (01/11/2015) . Stroke [facility])   "  catheter was advanced through the gastrojejunostomy track with tip ultimately terminating within the proximal jejunum. The retention balloon was inflated and the balloon was cinched. Contrast was injected and several spot fluoroscopic images were obtained in various obliquities confirming intraluminal positioning. The patient tolerated the procedure well without immediate postprocedural complication. IMPRESSION: Successful fluoroscopic guided conversion of existing gastrojejunostomy catheter to a new 22-French jejunostomy tube. This conversion was performed a secondary to recurrent occlusion of the jejunostomy lumen as well as lack of frequent usage of the gastrostomy lumen. The lumen of the new 22 French jejunostomy catheter is much larger in caliber and therefore has a decreased risk of occlusion. The new jejunostomy tube is ready for immediate use. Electronically Signed   By: Sandi Mariscal M.D.   On: 05/16/2016 15:40   Ir Gj Tube Change  Result Date: 05/16/2016 Please refer to "Notes" to see consult details.  Ir Cm Inj Any Colonic Tube W/fluoro  Result Date: 05/02/2016 INDICATION: Feeding difficulties, catheter dislodgement. Previous GJ exchange 02/08/2016. EXAM: GI TUBE INJECTION FLUOROSCOPY TIME:  2 minutes 40 second, 25 mGy COMPLICATIONS: None immediate. PROCEDURE: Informed written consent was obtained from the patient after a thorough discussion of the procedural risks,  benefits and alternatives. All questions were addressed. Sterile Technique was utilized including . A timeout was performed prior to the initiation of the procedure. Initial fluoroscopic inspection demonstrated good position of the retention balloon in the region the gastric lumen. The jejunostomy limb extends beyond the ligament of Treitz. The weighted tip appears folded back upon the catheter. Injection confirms patency of the jejunal limb. An angled stiff glidewire was advanced through the jejunal liMb to straighten the apparent kink in the distal catheter. Final injection shows good position of the catheter, no kink, tip beyond the ligament of Treitz, widely patent, okay for routine use. IMPRESSION: Dual-lumen gastrojejunostomy catheter in good position, okay for routine use. Electronically Signed   By: Lucrezia Europe M.D.   On: 05/02/2016 15:26   Dg Swallowing Func-speech Pathology  Result Date: 05/15/2016 Objective Swallowing Evaluation: Type of Study: MBS-Modified Barium Swallow Study Patient Details Name: Shannon [name] MRN: [id number] Date of Birth: December 01, 1923 Today's Date: 05/15/2016 Time: SLP Start Time (ACUTE ONLY): 0955-SLP Stop Time (ACUTE ONLY): 1014 SLP Time Calculation (min) (ACUTE ONLY): 19 min Past Medical History: Past Medical History: Diagnosis Date . Anemia   previous blood transfusions . Arthritis   "all over" . Asthma  . Bradycardia   requiring previous d/c of BB and reduction of amiodarone . CAD (coronary artery disease)   nonobstructive per notes . Chronic diastolic CHF (congestive heart failure) ([place])  . CKD (chronic kidney disease), stage III  . Complication of blood transfusion   "got the wrong blood type at [facility] in ~ 2015; no adverse reaction that we are aware of"/daughter, [name] (01/27/2016) . COPD (chronic obstructive pulmonary disease) ([place])  . Depression   "light case" . DVT (deep venous thrombosis) ([place]) 01/2016  a. LLE DVT 01/2016 - switched from Eliquis to Coumadin. . Gastric  stenosis   a. s/p stomach tube . GERD (gastroesophageal reflux disease)  . History of blood transfusion   "several" (01/27/2016) . History of stomach ulcers  . Hyperlipidemia  . Hypertension  . Hypothyroidism  . Paraesophageal hernia  . Perforated gastric ulcer ([place])  . Seasonal allergies  . SIADH (syndrome of inappropriate ADH production) ([place])   [name] 01/10/2015 . Small bowel obstruction   "I don't know how many" (01/11/2015) . Stroke [facility])   "  catheter was advanced through the gastrojejunostomy track with tip ultimately terminating within the proximal jejunum. The retention balloon was inflated and the balloon was cinched. Contrast was injected and several spot fluoroscopic images were obtained in various obliquities confirming intraluminal positioning. The patient tolerated the procedure well without immediate postprocedural complication. IMPRESSION: Successful fluoroscopic guided conversion of existing gastrojejunostomy catheter to a new 22-French jejunostomy tube. This conversion was performed a secondary to recurrent occlusion of the jejunostomy lumen as well as lack of frequent usage of the gastrostomy lumen. The lumen of the new 22 French jejunostomy catheter is much larger in caliber and therefore has a decreased risk of occlusion. The new jejunostomy tube is ready for immediate use. Electronically Signed   By: Sandi Mariscal M.D.   On: 05/16/2016 15:40   Ir Gj Tube Change  Result Date: 05/16/2016 Please refer to "Notes" to see consult details.  Ir Cm Inj Any Colonic Tube W/fluoro  Result Date: 05/02/2016 INDICATION: Feeding difficulties, catheter dislodgement. Previous GJ exchange 02/08/2016. EXAM: GI TUBE INJECTION FLUOROSCOPY TIME:  2 minutes 40 second, 25 mGy COMPLICATIONS: None immediate. PROCEDURE: Informed written consent was obtained from the patient after a thorough discussion of the procedural risks,  benefits and alternatives. All questions were addressed. Sterile Technique was utilized including . A timeout was performed prior to the initiation of the procedure. Initial fluoroscopic inspection demonstrated good position of the retention balloon in the region the gastric lumen. The jejunostomy limb extends beyond the ligament of Treitz. The weighted tip appears folded back upon the catheter. Injection confirms patency of the jejunal limb. An angled stiff glidewire was advanced through the jejunal liMb to straighten the apparent kink in the distal catheter. Final injection shows good position of the catheter, no kink, tip beyond the ligament of Treitz, widely patent, okay for routine use. IMPRESSION: Dual-lumen gastrojejunostomy catheter in good position, okay for routine use. Electronically Signed   By: Lucrezia Europe M.D.   On: 05/02/2016 15:26   Dg Swallowing Func-speech Pathology  Result Date: 05/15/2016 Objective Swallowing Evaluation: Type of Study: MBS-Modified Barium Swallow Study Patient Details Name: Shannon [name] MRN: [id number] Date of Birth: December 01, 1923 Today's Date: 05/15/2016 Time: SLP Start Time (ACUTE ONLY): 0955-SLP Stop Time (ACUTE ONLY): 1014 SLP Time Calculation (min) (ACUTE ONLY): 19 min Past Medical History: Past Medical History: Diagnosis Date . Anemia   previous blood transfusions . Arthritis   "all over" . Asthma  . Bradycardia   requiring previous d/c of BB and reduction of amiodarone . CAD (coronary artery disease)   nonobstructive per notes . Chronic diastolic CHF (congestive heart failure) ([place])  . CKD (chronic kidney disease), stage III  . Complication of blood transfusion   "got the wrong blood type at [facility] in ~ 2015; no adverse reaction that we are aware of"/daughter, [name] (01/27/2016) . COPD (chronic obstructive pulmonary disease) ([place])  . Depression   "light case" . DVT (deep venous thrombosis) ([place]) 01/2016  a. LLE DVT 01/2016 - switched from Eliquis to Coumadin. . Gastric  stenosis   a. s/p stomach tube . GERD (gastroesophageal reflux disease)  . History of blood transfusion   "several" (01/27/2016) . History of stomach ulcers  . Hyperlipidemia  . Hypertension  . Hypothyroidism  . Paraesophageal hernia  . Perforated gastric ulcer ([place])  . Seasonal allergies  . SIADH (syndrome of inappropriate ADH production) ([place])   [name] 01/10/2015 . Small bowel obstruction   "I don't know how many" (01/11/2015) . Stroke [facility])   "  catheter was advanced through the gastrojejunostomy track with tip ultimately terminating within the proximal jejunum. The retention balloon was inflated and the balloon was cinched. Contrast was injected and several spot fluoroscopic images were obtained in various obliquities confirming intraluminal positioning. The patient tolerated the procedure well without immediate postprocedural complication. IMPRESSION: Successful fluoroscopic guided conversion of existing gastrojejunostomy catheter to a new 22-French jejunostomy tube. This conversion was performed a secondary to recurrent occlusion of the jejunostomy lumen as well as lack of frequent usage of the gastrostomy lumen. The lumen of the new 22 French jejunostomy catheter is much larger in caliber and therefore has a decreased risk of occlusion. The new jejunostomy tube is ready for immediate use. Electronically Signed   By: Sandi Mariscal M.D.   On: 05/16/2016 15:40   Ir Gj Tube Change  Result Date: 05/16/2016 Please refer to "Notes" to see consult details.  Ir Cm Inj Any Colonic Tube W/fluoro  Result Date: 05/02/2016 INDICATION: Feeding difficulties, catheter dislodgement. Previous GJ exchange 02/08/2016. EXAM: GI TUBE INJECTION FLUOROSCOPY TIME:  2 minutes 40 second, 25 mGy COMPLICATIONS: None immediate. PROCEDURE: Informed written consent was obtained from the patient after a thorough discussion of the procedural risks,  benefits and alternatives. All questions were addressed. Sterile Technique was utilized including . A timeout was performed prior to the initiation of the procedure. Initial fluoroscopic inspection demonstrated good position of the retention balloon in the region the gastric lumen. The jejunostomy limb extends beyond the ligament of Treitz. The weighted tip appears folded back upon the catheter. Injection confirms patency of the jejunal limb. An angled stiff glidewire was advanced through the jejunal liMb to straighten the apparent kink in the distal catheter. Final injection shows good position of the catheter, no kink, tip beyond the ligament of Treitz, widely patent, okay for routine use. IMPRESSION: Dual-lumen gastrojejunostomy catheter in good position, okay for routine use. Electronically Signed   By: Lucrezia Europe M.D.   On: 05/02/2016 15:26   Dg Swallowing Func-speech Pathology  Result Date: 05/15/2016 Objective Swallowing Evaluation: Type of Study: MBS-Modified Barium Swallow Study Patient Details Name: Shannon [name] MRN: [id number] Date of Birth: December 01, 1923 Today's Date: 05/15/2016 Time: SLP Start Time (ACUTE ONLY): 0955-SLP Stop Time (ACUTE ONLY): 1014 SLP Time Calculation (min) (ACUTE ONLY): 19 min Past Medical History: Past Medical History: Diagnosis Date . Anemia   previous blood transfusions . Arthritis   "all over" . Asthma  . Bradycardia   requiring previous d/c of BB and reduction of amiodarone . CAD (coronary artery disease)   nonobstructive per notes . Chronic diastolic CHF (congestive heart failure) ([place])  . CKD (chronic kidney disease), stage III  . Complication of blood transfusion   "got the wrong blood type at [facility] in ~ 2015; no adverse reaction that we are aware of"/daughter, [name] (01/27/2016) . COPD (chronic obstructive pulmonary disease) ([place])  . Depression   "light case" . DVT (deep venous thrombosis) ([place]) 01/2016  a. LLE DVT 01/2016 - switched from Eliquis to Coumadin. . Gastric  stenosis   a. s/p stomach tube . GERD (gastroesophageal reflux disease)  . History of blood transfusion   "several" (01/27/2016) . History of stomach ulcers  . Hyperlipidemia  . Hypertension  . Hypothyroidism  . Paraesophageal hernia  . Perforated gastric ulcer ([place])  . Seasonal allergies  . SIADH (syndrome of inappropriate ADH production) ([place])   [name] 01/10/2015 . Small bowel obstruction   "I don't know how many" (01/11/2015) . Stroke [facility])   "  catheter was advanced through the gastrojejunostomy track with tip ultimately terminating within the proximal jejunum. The retention balloon was inflated and the balloon was cinched. Contrast was injected and several spot fluoroscopic images were obtained in various obliquities confirming intraluminal positioning. The patient tolerated the procedure well without immediate postprocedural complication. IMPRESSION: Successful fluoroscopic guided conversion of existing gastrojejunostomy catheter to a new 22-French jejunostomy tube. This conversion was performed a secondary to recurrent occlusion of the jejunostomy lumen as well as lack of frequent usage of the gastrostomy lumen. The lumen of the new 22 French jejunostomy catheter is much larger in caliber and therefore has a decreased risk of occlusion. The new jejunostomy tube is ready for immediate use. Electronically Signed   By: Sandi Mariscal M.D.   On: 05/16/2016 15:40   Ir Gj Tube Change  Result Date: 05/16/2016 Please refer to "Notes" to see consult details.  Ir Cm Inj Any Colonic Tube W/fluoro  Result Date: 05/02/2016 INDICATION: Feeding difficulties, catheter dislodgement. Previous GJ exchange 02/08/2016. EXAM: GI TUBE INJECTION FLUOROSCOPY TIME:  2 minutes 40 second, 25 mGy COMPLICATIONS: None immediate. PROCEDURE: Informed written consent was obtained from the patient after a thorough discussion of the procedural risks,  benefits and alternatives. All questions were addressed. Sterile Technique was utilized including . A timeout was performed prior to the initiation of the procedure. Initial fluoroscopic inspection demonstrated good position of the retention balloon in the region the gastric lumen. The jejunostomy limb extends beyond the ligament of Treitz. The weighted tip appears folded back upon the catheter. Injection confirms patency of the jejunal limb. An angled stiff glidewire was advanced through the jejunal liMb to straighten the apparent kink in the distal catheter. Final injection shows good position of the catheter, no kink, tip beyond the ligament of Treitz, widely patent, okay for routine use. IMPRESSION: Dual-lumen gastrojejunostomy catheter in good position, okay for routine use. Electronically Signed   By: Lucrezia Europe M.D.   On: 05/02/2016 15:26   Dg Swallowing Func-speech Pathology  Result Date: 05/15/2016 Objective Swallowing Evaluation: Type of Study: MBS-Modified Barium Swallow Study Patient Details Name: Shannon [name] MRN: [id number] Date of Birth: December 01, 1923 Today's Date: 05/15/2016 Time: SLP Start Time (ACUTE ONLY): 0955-SLP Stop Time (ACUTE ONLY): 1014 SLP Time Calculation (min) (ACUTE ONLY): 19 min Past Medical History: Past Medical History: Diagnosis Date . Anemia   previous blood transfusions . Arthritis   "all over" . Asthma  . Bradycardia   requiring previous d/c of BB and reduction of amiodarone . CAD (coronary artery disease)   nonobstructive per notes . Chronic diastolic CHF (congestive heart failure) ([place])  . CKD (chronic kidney disease), stage III  . Complication of blood transfusion   "got the wrong blood type at [facility] in ~ 2015; no adverse reaction that we are aware of"/daughter, [name] (01/27/2016) . COPD (chronic obstructive pulmonary disease) ([place])  . Depression   "light case" . DVT (deep venous thrombosis) ([place]) 01/2016  a. LLE DVT 01/2016 - switched from Eliquis to Coumadin. . Gastric  stenosis   a. s/p stomach tube . GERD (gastroesophageal reflux disease)  . History of blood transfusion   "several" (01/27/2016) . History of stomach ulcers  . Hyperlipidemia  . Hypertension  . Hypothyroidism  . Paraesophageal hernia  . Perforated gastric ulcer ([place])  . Seasonal allergies  . SIADH (syndrome of inappropriate ADH production) ([place])   [name] 01/10/2015 . Small bowel obstruction   "I don't know how many" (01/11/2015) . Stroke [facility])   "  Physician Discharge Summary  Shannon [name] PPI:[id number] DOB: September 19, 1923 DOA: 05/08/2016  PCP: Gildardo Cranker, DO  Admit date: 05/08/2016 Discharge date: 05/17/2016  Admitted From: Home Disposition:  Home  Recommendations for Outpatient Follow-up:  1. Follow up with PCP in 1 week 2. Follow up with Cardiology in 1 week  3. Please obtain BMP in 1 week   Home Health: PT  Equipment/Devices: None    Discharge Condition: Stable CODE STATUS: DNR  Diet recommendation: Heart healthy   Brief/Interim Summary: From H&P: Shannon [name] is a 83 y.o.femalewith medical history significant of persistent atrial fibrillation on amiodarone, bradycardia, CKD stage III,hypothyroidism,non-obstructive coronary artery disease, type 2 diabetes, hypertension, chronic diastolic CHF, stroke, cerebrovascular disease, COPD, asthma, gastric outlet obstruction s/p prior J tube, anemia and hyponatremia, presented with shortness of breath and cough for worsening for about 2 weeks associated with productive cough. She has white is to brownish phlegm. Has headache and occasional sore throat but denied runny nose, sick contact. She was hospitalized about 2 months ago. Denied fever, chest pain, nausea, vomiting. No diarrhea or constipation. Patient also reported feeling weak gradually worsening.  ED Course: In the ER patient was found to have hyponatremia. Chest x-ray with possible pneumonia and treated with vancomycin and cefepime. Admitted for further evaluation.  Interim: She was initially treated with vancomycin, cefepime, which was switched to ceftriaxone and azithromycin for treatment of possible community-acquired pneumonia. In addition, patient was treated with IV Lasix for her acute on chronic diastolic congestive heart failure. Echocardiogram was repeated which showed EF of 60-65%. Cardiology was also consulted during admission. Amiodarone was stopped as patient persistent atrial fibrillation is treated with goal of rate  control and also due to concern for pulmonary toxicity. Diuretic was adjusted to lasix 40mg  PO daily. Interventional radiology was also contacted due to clogged J-tube. This was successfully unclogged, but ultimately underwent exchange of G-J tube as it was clogged again.   Subjective on day of discharge: Doing well this morning. Her main complaint today was irritation in the back of her throat due to cough. Denies any shortness of breath or chest pain. Asked RN to reassess ambulatory pulse ox today. According to report, patient did not desaturate with ambulation today under 93% and will not require O2 at discharge.   Discharge Diagnoses:  Active Problems:   Persistent atrial fibrillation (HCC)   Hypothyroidism   CAP (community acquired pneumonia)   Chronic kidney insufficiency, stage 3 (moderate)   Hyponatremia   Diabetic polyneuropathy associated with type 2 diabetes mellitus (HCC)   Acute on chronic diastolic CHF (congestive heart failure), NYHA class 4 (HCC)   PEG (percutaneous endoscopic gastrostomy) status ([place])   HCAP (healthcare-associated pneumonia)   Possible community acquired pneumonia - Patient completed course of 7 days of ceftriaxone and azithromycin - Respiratory virus panel was negative - Continue supportive care  Hypoxic respiratory failure - Amiodarone discontinued by cardiology due to concern for pulmonary toxicity - Ambulatory pulse ox today is much improved, did not drop under 93% per report   H/o gastric outlet obstruction - With PEG-J tube, declogged by IR 2/7, now clogged again.  - PEG tube replaced 2/12 per IR  Acute on chronic diastolic congestive heart failure - Patient was initially on IV Lasix, this was converted to by mouth on 2/6, she lost ~ 6 lbs (141>135) and was converted to PO, however she remained persistently dyspneic and cardiology was consulted on 2/8, and IV Lasix was resumed 2/8, transition to PO again.  -  catheter was advanced through the gastrojejunostomy track with tip ultimately terminating within the proximal jejunum. The retention balloon was inflated and the balloon was cinched. Contrast was injected and several spot fluoroscopic images were obtained in various obliquities confirming intraluminal positioning. The patient tolerated the procedure well without immediate postprocedural complication. IMPRESSION: Successful fluoroscopic guided conversion of existing gastrojejunostomy catheter to a new 22-French jejunostomy tube. This conversion was performed a secondary to recurrent occlusion of the jejunostomy lumen as well as lack of frequent usage of the gastrostomy lumen. The lumen of the new 22 French jejunostomy catheter is much larger in caliber and therefore has a decreased risk of occlusion. The new jejunostomy tube is ready for immediate use. Electronically Signed   By: Sandi Mariscal M.D.   On: 05/16/2016 15:40   Ir Gj Tube Change  Result Date: 05/16/2016 Please refer to "Notes" to see consult details.  Ir Cm Inj Any Colonic Tube W/fluoro  Result Date: 05/02/2016 INDICATION: Feeding difficulties, catheter dislodgement. Previous GJ exchange 02/08/2016. EXAM: GI TUBE INJECTION FLUOROSCOPY TIME:  2 minutes 40 second, 25 mGy COMPLICATIONS: None immediate. PROCEDURE: Informed written consent was obtained from the patient after a thorough discussion of the procedural risks,  benefits and alternatives. All questions were addressed. Sterile Technique was utilized including . A timeout was performed prior to the initiation of the procedure. Initial fluoroscopic inspection demonstrated good position of the retention balloon in the region the gastric lumen. The jejunostomy limb extends beyond the ligament of Treitz. The weighted tip appears folded back upon the catheter. Injection confirms patency of the jejunal limb. An angled stiff glidewire was advanced through the jejunal liMb to straighten the apparent kink in the distal catheter. Final injection shows good position of the catheter, no kink, tip beyond the ligament of Treitz, widely patent, okay for routine use. IMPRESSION: Dual-lumen gastrojejunostomy catheter in good position, okay for routine use. Electronically Signed   By: Lucrezia Europe M.D.   On: 05/02/2016 15:26   Dg Swallowing Func-speech Pathology  Result Date: 05/15/2016 Objective Swallowing Evaluation: Type of Study: MBS-Modified Barium Swallow Study Patient Details Name: Shannon [name] MRN: [id number] Date of Birth: December 01, 1923 Today's Date: 05/15/2016 Time: SLP Start Time (ACUTE ONLY): 0955-SLP Stop Time (ACUTE ONLY): 1014 SLP Time Calculation (min) (ACUTE ONLY): 19 min Past Medical History: Past Medical History: Diagnosis Date . Anemia   previous blood transfusions . Arthritis   "all over" . Asthma  . Bradycardia   requiring previous d/c of BB and reduction of amiodarone . CAD (coronary artery disease)   nonobstructive per notes . Chronic diastolic CHF (congestive heart failure) ([place])  . CKD (chronic kidney disease), stage III  . Complication of blood transfusion   "got the wrong blood type at [facility] in ~ 2015; no adverse reaction that we are aware of"/daughter, [name] (01/27/2016) . COPD (chronic obstructive pulmonary disease) ([place])  . Depression   "light case" . DVT (deep venous thrombosis) ([place]) 01/2016  a. LLE DVT 01/2016 - switched from Eliquis to Coumadin. . Gastric  stenosis   a. s/p stomach tube . GERD (gastroesophageal reflux disease)  . History of blood transfusion   "several" (01/27/2016) . History of stomach ulcers  . Hyperlipidemia  . Hypertension  . Hypothyroidism  . Paraesophageal hernia  . Perforated gastric ulcer ([place])  . Seasonal allergies  . SIADH (syndrome of inappropriate ADH production) ([place])   [name] 01/10/2015 . Small bowel obstruction   "I don't know how many" (01/11/2015) . Stroke [facility])   "  catheter was advanced through the gastrojejunostomy track with tip ultimately terminating within the proximal jejunum. The retention balloon was inflated and the balloon was cinched. Contrast was injected and several spot fluoroscopic images were obtained in various obliquities confirming intraluminal positioning. The patient tolerated the procedure well without immediate postprocedural complication. IMPRESSION: Successful fluoroscopic guided conversion of existing gastrojejunostomy catheter to a new 22-French jejunostomy tube. This conversion was performed a secondary to recurrent occlusion of the jejunostomy lumen as well as lack of frequent usage of the gastrostomy lumen. The lumen of the new 22 French jejunostomy catheter is much larger in caliber and therefore has a decreased risk of occlusion. The new jejunostomy tube is ready for immediate use. Electronically Signed   By: Sandi Mariscal M.D.   On: 05/16/2016 15:40   Ir Gj Tube Change  Result Date: 05/16/2016 Please refer to "Notes" to see consult details.  Ir Cm Inj Any Colonic Tube W/fluoro  Result Date: 05/02/2016 INDICATION: Feeding difficulties, catheter dislodgement. Previous GJ exchange 02/08/2016. EXAM: GI TUBE INJECTION FLUOROSCOPY TIME:  2 minutes 40 second, 25 mGy COMPLICATIONS: None immediate. PROCEDURE: Informed written consent was obtained from the patient after a thorough discussion of the procedural risks,  benefits and alternatives. All questions were addressed. Sterile Technique was utilized including . A timeout was performed prior to the initiation of the procedure. Initial fluoroscopic inspection demonstrated good position of the retention balloon in the region the gastric lumen. The jejunostomy limb extends beyond the ligament of Treitz. The weighted tip appears folded back upon the catheter. Injection confirms patency of the jejunal limb. An angled stiff glidewire was advanced through the jejunal liMb to straighten the apparent kink in the distal catheter. Final injection shows good position of the catheter, no kink, tip beyond the ligament of Treitz, widely patent, okay for routine use. IMPRESSION: Dual-lumen gastrojejunostomy catheter in good position, okay for routine use. Electronically Signed   By: Lucrezia Europe M.D.   On: 05/02/2016 15:26   Dg Swallowing Func-speech Pathology  Result Date: 05/15/2016 Objective Swallowing Evaluation: Type of Study: MBS-Modified Barium Swallow Study Patient Details Name: Shannon [name] MRN: [id number] Date of Birth: December 01, 1923 Today's Date: 05/15/2016 Time: SLP Start Time (ACUTE ONLY): 0955-SLP Stop Time (ACUTE ONLY): 1014 SLP Time Calculation (min) (ACUTE ONLY): 19 min Past Medical History: Past Medical History: Diagnosis Date . Anemia   previous blood transfusions . Arthritis   "all over" . Asthma  . Bradycardia   requiring previous d/c of BB and reduction of amiodarone . CAD (coronary artery disease)   nonobstructive per notes . Chronic diastolic CHF (congestive heart failure) ([place])  . CKD (chronic kidney disease), stage III  . Complication of blood transfusion   "got the wrong blood type at [facility] in ~ 2015; no adverse reaction that we are aware of"/daughter, [name] (01/27/2016) . COPD (chronic obstructive pulmonary disease) ([place])  . Depression   "light case" . DVT (deep venous thrombosis) ([place]) 01/2016  a. LLE DVT 01/2016 - switched from Eliquis to Coumadin. . Gastric  stenosis   a. s/p stomach tube . GERD (gastroesophageal reflux disease)  . History of blood transfusion   "several" (01/27/2016) . History of stomach ulcers  . Hyperlipidemia  . Hypertension  . Hypothyroidism  . Paraesophageal hernia  . Perforated gastric ulcer ([place])  . Seasonal allergies  . SIADH (syndrome of inappropriate ADH production) ([place])   [name] 01/10/2015 . Small bowel obstruction   "I don't know how many" (01/11/2015) . Stroke [facility])   "  Physician Discharge Summary  Shannon [name] PPI:[id number] DOB: September 19, 1923 DOA: 05/08/2016  PCP: Gildardo Cranker, DO  Admit date: 05/08/2016 Discharge date: 05/17/2016  Admitted From: Home Disposition:  Home  Recommendations for Outpatient Follow-up:  1. Follow up with PCP in 1 week 2. Follow up with Cardiology in 1 week  3. Please obtain BMP in 1 week   Home Health: PT  Equipment/Devices: None    Discharge Condition: Stable CODE STATUS: DNR  Diet recommendation: Heart healthy   Brief/Interim Summary: From H&P: Shannon [name] is a 83 y.o.femalewith medical history significant of persistent atrial fibrillation on amiodarone, bradycardia, CKD stage III,hypothyroidism,non-obstructive coronary artery disease, type 2 diabetes, hypertension, chronic diastolic CHF, stroke, cerebrovascular disease, COPD, asthma, gastric outlet obstruction s/p prior J tube, anemia and hyponatremia, presented with shortness of breath and cough for worsening for about 2 weeks associated with productive cough. She has white is to brownish phlegm. Has headache and occasional sore throat but denied runny nose, sick contact. She was hospitalized about 2 months ago. Denied fever, chest pain, nausea, vomiting. No diarrhea or constipation. Patient also reported feeling weak gradually worsening.  ED Course: In the ER patient was found to have hyponatremia. Chest x-ray with possible pneumonia and treated with vancomycin and cefepime. Admitted for further evaluation.  Interim: She was initially treated with vancomycin, cefepime, which was switched to ceftriaxone and azithromycin for treatment of possible community-acquired pneumonia. In addition, patient was treated with IV Lasix for her acute on chronic diastolic congestive heart failure. Echocardiogram was repeated which showed EF of 60-65%. Cardiology was also consulted during admission. Amiodarone was stopped as patient persistent atrial fibrillation is treated with goal of rate  control and also due to concern for pulmonary toxicity. Diuretic was adjusted to lasix 40mg  PO daily. Interventional radiology was also contacted due to clogged J-tube. This was successfully unclogged, but ultimately underwent exchange of G-J tube as it was clogged again.   Subjective on day of discharge: Doing well this morning. Her main complaint today was irritation in the back of her throat due to cough. Denies any shortness of breath or chest pain. Asked RN to reassess ambulatory pulse ox today. According to report, patient did not desaturate with ambulation today under 93% and will not require O2 at discharge.   Discharge Diagnoses:  Active Problems:   Persistent atrial fibrillation (HCC)   Hypothyroidism   CAP (community acquired pneumonia)   Chronic kidney insufficiency, stage 3 (moderate)   Hyponatremia   Diabetic polyneuropathy associated with type 2 diabetes mellitus (HCC)   Acute on chronic diastolic CHF (congestive heart failure), NYHA class 4 (HCC)   PEG (percutaneous endoscopic gastrostomy) status ([place])   HCAP (healthcare-associated pneumonia)   Possible community acquired pneumonia - Patient completed course of 7 days of ceftriaxone and azithromycin - Respiratory virus panel was negative - Continue supportive care  Hypoxic respiratory failure - Amiodarone discontinued by cardiology due to concern for pulmonary toxicity - Ambulatory pulse ox today is much improved, did not drop under 93% per report   H/o gastric outlet obstruction - With PEG-J tube, declogged by IR 2/7, now clogged again.  - PEG tube replaced 2/12 per IR  Acute on chronic diastolic congestive heart failure - Patient was initially on IV Lasix, this was converted to by mouth on 2/6, she lost ~ 6 lbs (141>135) and was converted to PO, however she remained persistently dyspneic and cardiology was consulted on 2/8, and IV Lasix was resumed 2/8, transition to PO again.  -

## 2016-05-17 NOTE — Progress Notes (Signed)
Physical Therapy Treatment Patient Details Name: Shannon [name] MRN: [id number] DOB: Jun 08, 1923 Today's Date: 05/17/2016    History of Present Illness 83 y.o. female with medical history significant of persistent atrial fibrillation on amiodarone, bradycardia, CKD stage III, hypothyroidism, non-obstructive coronary artery disease, type 2 diabetes, hypertension, chronic diastolic CHF, stroke, cerebrovascular disease, COPD, asthma, gastric outlet obstruction s/p prior J tube, anemia and hyponatremia, presented with shortness of breath and cough for worsening for about 2 weeks.    PT Comments    Pt continues to make excellent progress with mobility, requiring less physical assistance and improving efficiency. Pt would continue to benefit from skilled physical therapy services at this time while admitted and after d/c to address her limitations in order to improve her overall safety and independence with functional mobility.    Follow Up Recommendations  Home health PT;Supervision/Assistance - 24 hour     Equipment Recommendations  None recommended by PT    Recommendations for Other Services       Precautions / Restrictions Precautions Precautions: Fall Restrictions Weight Bearing Restrictions: No    Mobility  Bed Mobility Overal bed mobility: Needs Assistance Bed Mobility: Supine to Sit     Supine to sit: Supervision     General bed mobility comments: increased time, supervision for safety  Transfers Overall transfer level: Needs assistance Equipment used: Rolling walker (2 wheeled) Transfers: Sit to/from Stand Sit to Stand: Supervision         General transfer comment: increased time, supervision for safety  Ambulation/Gait Ambulation/Gait assistance: Min guard Ambulation Distance (Feet): 75 Feet Assistive device: Rolling walker (2 wheeled) Gait Pattern/deviations: Step-through pattern;Decreased stride length;Trunk flexed Gait velocity: decreased Gait velocity  interpretation: Below normal speed for age/gender General Gait Details: Min guard for safety. No LOB. Steady gait with RW.   Stairs            Wheelchair Mobility    Modified Rankin (Stroke Patients Only)       Balance Overall balance assessment: Needs assistance Sitting-balance support: Feet supported;No upper extremity supported Sitting balance-Leahy Scale: Good Sitting balance - Comments: able to sit EOB with out physical assist   Standing balance support: During functional activity;Bilateral upper extremity supported Standing balance-Leahy Scale: Poor Standing balance comment: reliant on bilateral UE supports on RW                     Cognition Arousal/Alertness: Awake/alert Behavior During Therapy: WFL for tasks assessed/performed Overall Cognitive Status: Within Functional Limits for tasks assessed                      Exercises General Exercises - Lower Extremity Long Arc Quad: AROM;Strengthening;Both;10 reps;Seated Hip Flexion/Marching: AROM;Strengthening;Both;10 reps;Seated    General Comments        Pertinent Vitals/Pain Pain Assessment: No/denies pain    Home Living                      Prior Function            PT Goals (current goals can now be found in the care plan section) Acute Rehab PT Goals Patient Stated Goal: to get better PT Goal Formulation: With patient Time For Goal Achievement: 05/23/16 Potential to Achieve Goals: Good Progress towards PT goals: Progressing toward goals    Frequency    Min 3X/week      PT Plan Current plan remains appropriate    Co-evaluation  End of Session Equipment Utilized During Treatment: Gait belt Activity Tolerance: Patient tolerated treatment well Patient left: in chair;with chair alarm set;with call bell/phone within reach     Time: 1046-1100 PT Time Calculation (min) (ACUTE ONLY): 14 min  Charges:  $Gait Training: 8-22 mins                     G CodesAlessandra Bevels Quenesha Douglass 2016/05/26, 11:03 AM Deborah Chalk, PT, DPT [phone]

## 2016-05-17 NOTE — Progress Notes (Signed)
Call placed to [facility] to notify of telemetry monitoring d/c.  Per verbal dc notice from Dr Maylene Roes .

## 2016-05-18 ENCOUNTER — Telehealth: Payer: Self-pay | Admitting: General Surgery

## 2016-05-18 ENCOUNTER — Other Ambulatory Visit: Payer: Self-pay

## 2016-05-18 DIAGNOSIS — E7800 Pure hypercholesterolemia, unspecified: Secondary | ICD-10-CM

## 2016-05-18 NOTE — Telephone Encounter (Signed)
Due for lipid panel

## 2016-05-18 NOTE — Progress Notes (Signed)
Patient's daughter called concerning some new leakage around the patient's J-tube today.  She has pushed the phalange flush to the skin.  I have advised to hold tube feeds for a few hours to see if this helps as well.  Since this is the first day, I have told her to keep an eye on the leakage and to see if any other these other things help.  If this continues to be a problem, then she has been advised to call us back for evaluation.  Wisdom Rickey E 4:32 PM 05/18/2016

## 2016-05-19 ENCOUNTER — Other Ambulatory Visit: Payer: Self-pay

## 2016-05-19 ENCOUNTER — Telehealth: Payer: Self-pay

## 2016-05-19 DIAGNOSIS — J189 Pneumonia, unspecified organism: Secondary | ICD-10-CM | POA: Diagnosis not present

## 2016-05-19 DIAGNOSIS — I5033 Acute on chronic diastolic (congestive) heart failure: Secondary | ICD-10-CM | POA: Diagnosis not present

## 2016-05-19 DIAGNOSIS — E1142 Type 2 diabetes mellitus with diabetic polyneuropathy: Secondary | ICD-10-CM | POA: Diagnosis not present

## 2016-05-19 DIAGNOSIS — Z7951 Long term (current) use of inhaled steroids: Secondary | ICD-10-CM | POA: Diagnosis not present

## 2016-05-19 DIAGNOSIS — E1122 Type 2 diabetes mellitus with diabetic chronic kidney disease: Secondary | ICD-10-CM | POA: Diagnosis not present

## 2016-05-19 DIAGNOSIS — I130 Hypertensive heart and chronic kidney disease with heart failure and stage 1 through stage 4 chronic kidney disease, or unspecified chronic kidney disease: Secondary | ICD-10-CM | POA: Diagnosis not present

## 2016-05-19 DIAGNOSIS — I481 Persistent atrial fibrillation: Secondary | ICD-10-CM | POA: Diagnosis not present

## 2016-05-19 DIAGNOSIS — I2510 Atherosclerotic heart disease of native coronary artery without angina pectoris: Secondary | ICD-10-CM | POA: Diagnosis not present

## 2016-05-19 DIAGNOSIS — N183 Chronic kidney disease, stage 3 (moderate): Secondary | ICD-10-CM | POA: Diagnosis not present

## 2016-05-19 DIAGNOSIS — Z7901 Long term (current) use of anticoagulants: Secondary | ICD-10-CM | POA: Diagnosis not present

## 2016-05-19 DIAGNOSIS — R1310 Dysphagia, unspecified: Secondary | ICD-10-CM | POA: Diagnosis not present

## 2016-05-19 DIAGNOSIS — Z931 Gastrostomy status: Secondary | ICD-10-CM | POA: Diagnosis not present

## 2016-05-19 NOTE — Patient Outreach (Signed)
[facility]) Care Management  05/19/2016  Shannon [name] 1924/01/11 [id number]       EMMI-PNA RED ON EMMI ALERT Day # 1 Date: 05/18/16 Red Alert Reason:"Scheduled a follow up appt? No"    Outreach attempt #1 to patient. Spoke with primary caregiver/dtr-[name]. Addressed and reviewed red alert. Dtr states that she has made patient's follow up appts. She goes to se PCP on 05/26/16 and cardiologist on 05/29/16.. Dtr continues to transport patient to appts as well as manage meds. Denies any issues with meds. [place] services have contacted them and RN scheduled to make home visit today. Dtr reports that patient did not have a good night last night. She states that patient complained of some SOB. Dtr checked patient with pulse ox and sats were in the high 90s and HR in the 40s. Patient has known history of bradycardia. Dtr reports she rechecked HR and it was back up to 68. Dtr provided patient with comfort measures by adding pillows and elevating HOB which provided relief. She states she plans to speak with [facility] regarding possibly getting oxygen in the home for prn usage. Dtr continues to verbalizes that she is able to manage patient's care and no further RN CM need or concerns at this time. Advised that patient will continue to get automated post discharge EMMI-PNA calls and will receive call from nurse if any responses are abnormal. Dtr familiar with program as patient had post discharge EMMI calls during last hospital discharge. She voiced understanding and was appreciative of call.    Plan: RN CM will notify [facility] administrative assistant of case status.  Enzo Montgomery, RN,BSN,CCM [place] [facility] Telephonic Care Management Coordinator Direct Phone: [phone] Toll Free: [phone] Fax: [phone]

## 2016-05-19 NOTE — Telephone Encounter (Signed)
Transition Care Management Follow-Up Telephone Call   Date discharged and where: 05/17/16[facility]  How have you been since you were released from the hospital? Pt is doing ok; not coughing as much, able to get Nectar thick liquids down. Having normal Bm's. Pt's daughter is wanting pt to be on O2 at home b/c she says that it helped the pt when she was in the hospital, even though pt's O2 stats were WNL.   Items Reviewed:   Meds: Y  Allergies:Y  Dietary Changes Reviewed:Y  Functional Questionnaire:  Independent-I Dependent-D  ADLs:   Dressing- D    Eating-I   Maintaining continence-D   Transferring-D   Transportation-D   Meal Prep-D   Managing Meds- D  Confirmed importance and Date/Time of follow-up visits scheduled: 05/26/16 with Dr. Eulas Post   Confirmed with patient if condition worsens to call PCP or go to the Emergency Dept. Patient was given office number and encouraged to call back with questions or concerns: Darreld Mclean

## 2016-05-21 ENCOUNTER — Other Ambulatory Visit: Payer: Self-pay | Admitting: Internal Medicine

## 2016-05-22 ENCOUNTER — Inpatient Hospital Stay (HOSPITAL_COMMUNITY)
Admission: EM | Admit: 2016-05-22 | Discharge: 2016-05-30 | DRG: 871 | Disposition: A | Discharge status: Home / Self Care | Payer: Medicare Other | Attending: Internal Medicine | Admitting: Internal Medicine

## 2016-05-22 ENCOUNTER — Encounter (HOSPITAL_COMMUNITY): Payer: Self-pay | Admitting: Emergency Medicine

## 2016-05-22 ENCOUNTER — Other Ambulatory Visit: Payer: Self-pay

## 2016-05-22 ENCOUNTER — Emergency Department (HOSPITAL_COMMUNITY): Payer: Medicare Other

## 2016-05-22 DIAGNOSIS — R627 Adult failure to thrive: Secondary | ICD-10-CM | POA: Diagnosis present

## 2016-05-22 DIAGNOSIS — J9621 Acute and chronic respiratory failure with hypoxia: Secondary | ICD-10-CM | POA: Diagnosis present

## 2016-05-22 DIAGNOSIS — E1122 Type 2 diabetes mellitus with diabetic chronic kidney disease: Secondary | ICD-10-CM | POA: Diagnosis present

## 2016-05-22 DIAGNOSIS — E038 Other specified hypothyroidism: Secondary | ICD-10-CM | POA: Diagnosis not present

## 2016-05-22 DIAGNOSIS — Z8711 Personal history of peptic ulcer disease: Secondary | ICD-10-CM

## 2016-05-22 DIAGNOSIS — T17990A Other foreign object in respiratory tract, part unspecified in causing asphyxiation, initial encounter: Secondary | ICD-10-CM | POA: Diagnosis present

## 2016-05-22 DIAGNOSIS — I5033 Acute on chronic diastolic (congestive) heart failure: Secondary | ICD-10-CM | POA: Diagnosis present

## 2016-05-22 DIAGNOSIS — J441 Chronic obstructive pulmonary disease with (acute) exacerbation: Secondary | ICD-10-CM | POA: Diagnosis present

## 2016-05-22 DIAGNOSIS — Z66 Do not resuscitate: Secondary | ICD-10-CM | POA: Diagnosis present

## 2016-05-22 DIAGNOSIS — J9811 Atelectasis: Secondary | ICD-10-CM | POA: Diagnosis present

## 2016-05-22 DIAGNOSIS — E032 Hypothyroidism due to medicaments and other exogenous substances: Secondary | ICD-10-CM | POA: Diagnosis present

## 2016-05-22 DIAGNOSIS — I2510 Atherosclerotic heart disease of native coronary artery without angina pectoris: Secondary | ICD-10-CM | POA: Diagnosis present

## 2016-05-22 DIAGNOSIS — I5032 Chronic diastolic (congestive) heart failure: Secondary | ICD-10-CM | POA: Diagnosis not present

## 2016-05-22 DIAGNOSIS — K311 Adult hypertrophic pyloric stenosis: Secondary | ICD-10-CM | POA: Diagnosis present

## 2016-05-22 DIAGNOSIS — R0603 Acute respiratory distress: Secondary | ICD-10-CM | POA: Diagnosis not present

## 2016-05-22 DIAGNOSIS — Z9851 Tubal ligation status: Secondary | ICD-10-CM

## 2016-05-22 DIAGNOSIS — Z86718 Personal history of other venous thrombosis and embolism: Secondary | ICD-10-CM

## 2016-05-22 DIAGNOSIS — Y95 Nosocomial condition: Secondary | ICD-10-CM | POA: Diagnosis present

## 2016-05-22 DIAGNOSIS — Z79899 Other long term (current) drug therapy: Secondary | ICD-10-CM

## 2016-05-22 DIAGNOSIS — I4819 Other persistent atrial fibrillation: Secondary | ICD-10-CM | POA: Diagnosis present

## 2016-05-22 DIAGNOSIS — Z9049 Acquired absence of other specified parts of digestive tract: Secondary | ICD-10-CM

## 2016-05-22 DIAGNOSIS — Z8673 Personal history of transient ischemic attack (TIA), and cerebral infarction without residual deficits: Secondary | ICD-10-CM | POA: Diagnosis not present

## 2016-05-22 DIAGNOSIS — K219 Gastro-esophageal reflux disease without esophagitis: Secondary | ICD-10-CM | POA: Diagnosis present

## 2016-05-22 DIAGNOSIS — A419 Sepsis, unspecified organism: Secondary | ICD-10-CM | POA: Diagnosis present

## 2016-05-22 DIAGNOSIS — J189 Pneumonia, unspecified organism: Secondary | ICD-10-CM

## 2016-05-22 DIAGNOSIS — R0689 Other abnormalities of breathing: Secondary | ICD-10-CM | POA: Diagnosis not present

## 2016-05-22 DIAGNOSIS — Z87891 Personal history of nicotine dependence: Secondary | ICD-10-CM

## 2016-05-22 DIAGNOSIS — Z7189 Other specified counseling: Secondary | ICD-10-CM

## 2016-05-22 DIAGNOSIS — Z9841 Cataract extraction status, right eye: Secondary | ICD-10-CM | POA: Diagnosis not present

## 2016-05-22 DIAGNOSIS — R0602 Shortness of breath: Secondary | ICD-10-CM | POA: Diagnosis not present

## 2016-05-22 DIAGNOSIS — Z961 Presence of intraocular lens: Secondary | ICD-10-CM | POA: Diagnosis present

## 2016-05-22 DIAGNOSIS — I481 Persistent atrial fibrillation: Secondary | ICD-10-CM | POA: Diagnosis present

## 2016-05-22 DIAGNOSIS — M1990 Unspecified osteoarthritis, unspecified site: Secondary | ICD-10-CM | POA: Diagnosis present

## 2016-05-22 DIAGNOSIS — I480 Paroxysmal atrial fibrillation: Secondary | ICD-10-CM | POA: Diagnosis present

## 2016-05-22 DIAGNOSIS — J690 Pneumonitis due to inhalation of food and vomit: Secondary | ICD-10-CM | POA: Diagnosis not present

## 2016-05-22 DIAGNOSIS — Z931 Gastrostomy status: Secondary | ICD-10-CM | POA: Diagnosis not present

## 2016-05-22 DIAGNOSIS — E1165 Type 2 diabetes mellitus with hyperglycemia: Secondary | ICD-10-CM | POA: Diagnosis present

## 2016-05-22 DIAGNOSIS — E872 Acidosis: Secondary | ICD-10-CM | POA: Diagnosis present

## 2016-05-22 DIAGNOSIS — J9601 Acute respiratory failure with hypoxia: Secondary | ICD-10-CM | POA: Diagnosis not present

## 2016-05-22 DIAGNOSIS — N183 Chronic kidney disease, stage 3 (moderate): Secondary | ICD-10-CM | POA: Diagnosis present

## 2016-05-22 DIAGNOSIS — Z880 Allergy status to penicillin: Secondary | ICD-10-CM

## 2016-05-22 DIAGNOSIS — R0600 Dyspnea, unspecified: Secondary | ICD-10-CM | POA: Diagnosis not present

## 2016-05-22 DIAGNOSIS — E871 Hypo-osmolality and hyponatremia: Secondary | ICD-10-CM | POA: Diagnosis present

## 2016-05-22 DIAGNOSIS — Z8701 Personal history of pneumonia (recurrent): Secondary | ICD-10-CM

## 2016-05-22 DIAGNOSIS — E785 Hyperlipidemia, unspecified: Secondary | ICD-10-CM | POA: Diagnosis present

## 2016-05-22 DIAGNOSIS — Z833 Family history of diabetes mellitus: Secondary | ICD-10-CM

## 2016-05-22 DIAGNOSIS — Z8249 Family history of ischemic heart disease and other diseases of the circulatory system: Secondary | ICD-10-CM

## 2016-05-22 DIAGNOSIS — I130 Hypertensive heart and chronic kidney disease with heart failure and stage 1 through stage 4 chronic kidney disease, or unspecified chronic kidney disease: Secondary | ICD-10-CM | POA: Diagnosis present

## 2016-05-22 DIAGNOSIS — Z9889 Other specified postprocedural states: Secondary | ICD-10-CM

## 2016-05-22 DIAGNOSIS — H409 Unspecified glaucoma: Secondary | ICD-10-CM | POA: Diagnosis present

## 2016-05-22 DIAGNOSIS — I482 Chronic atrial fibrillation: Secondary | ICD-10-CM | POA: Diagnosis present

## 2016-05-22 DIAGNOSIS — T380X5A Adverse effect of glucocorticoids and synthetic analogues, initial encounter: Secondary | ICD-10-CM | POA: Diagnosis present

## 2016-05-22 DIAGNOSIS — Z9842 Cataract extraction status, left eye: Secondary | ICD-10-CM

## 2016-05-22 DIAGNOSIS — Z823 Family history of stroke: Secondary | ICD-10-CM

## 2016-05-22 DIAGNOSIS — Z7901 Long term (current) use of anticoagulants: Secondary | ICD-10-CM

## 2016-05-22 DIAGNOSIS — R0682 Tachypnea, not elsewhere classified: Secondary | ICD-10-CM | POA: Diagnosis present

## 2016-05-22 LAB — I-STAT CHEM 8, ED
BUN: 48 mg/dL — ABNORMAL HIGH (ref 6–20)
Calcium, Ion: 1.19 mmol/L (ref 1.15–1.40)
Chloride: 96 mmol/L — ABNORMAL LOW (ref 101–111)
Creatinine, Ser: 1.3 mg/dL — ABNORMAL HIGH (ref 0.44–1.00)
Glucose, Bld: 171 mg/dL — ABNORMAL HIGH (ref 65–99)
HCT: 37 % (ref 36.0–46.0)
Hemoglobin: 12.6 g/dL (ref 12.0–15.0)
Potassium: 4.4 mmol/L (ref 3.5–5.1)
Sodium: 131 mmol/L — ABNORMAL LOW (ref 135–145)
TCO2: 28 mmol/L (ref 0–100)

## 2016-05-22 LAB — RESPIRATORY PANEL BY PCR

## 2016-05-22 LAB — URINALYSIS, ROUTINE W REFLEX MICROSCOPIC
Bacteria, UA: NONE SEEN
Bilirubin Urine: NEGATIVE
Glucose, UA: NEGATIVE mg/dL
Hgb urine dipstick: NEGATIVE
Ketones, ur: NEGATIVE mg/dL
Leukocytes, UA: NEGATIVE
Nitrite: NEGATIVE
Protein, ur: 100 mg/dL — AB
Specific Gravity, Urine: 1.011 (ref 1.005–1.030)
Squamous Epithelial / LPF: NONE SEEN
pH: 7 (ref 5.0–8.0)

## 2016-05-22 LAB — CBC WITH DIFFERENTIAL/PLATELET
Basophils Absolute: 0 10*3/uL (ref 0.0–0.1)
Basophils Relative: 0 %
Eosinophils Absolute: 0.4 10*3/uL (ref 0.0–0.7)
Eosinophils Relative: 5 %
HCT: 33.1 % — ABNORMAL LOW (ref 36.0–46.0)
Hemoglobin: 11.4 g/dL — ABNORMAL LOW (ref 12.0–15.0)
Lymphocytes Relative: 27 %
Lymphs Abs: 2.2 10*3/uL (ref 0.7–4.0)
MCH: 28.6 pg (ref 26.0–34.0)
MCHC: 34.4 g/dL (ref 30.0–36.0)
MCV: 83 fL (ref 78.0–100.0)
Monocytes Absolute: 0.8 10*3/uL (ref 0.1–1.0)
Monocytes Relative: 10 %
Neutro Abs: 4.9 10*3/uL (ref 1.7–7.7)
Neutrophils Relative %: 58 %
Platelets: 418 10*3/uL — ABNORMAL HIGH (ref 150–400)
RBC: 3.99 MIL/uL (ref 3.87–5.11)
RDW: 18.9 % — ABNORMAL HIGH (ref 11.5–15.5)
WBC: 8.3 10*3/uL (ref 4.0–10.5)

## 2016-05-22 LAB — I-STAT TROPONIN, ED: Troponin i, poc: 0 ng/mL (ref 0.00–0.08)

## 2016-05-22 LAB — COMPREHENSIVE METABOLIC PANEL
ALT: 68 U/L — ABNORMAL HIGH (ref 14–54)
AST: 50 U/L — ABNORMAL HIGH (ref 15–41)
Albumin: 3.5 g/dL (ref 3.5–5.0)
Alkaline Phosphatase: 87 U/L (ref 38–126)
Anion gap: 12 (ref 5–15)
BUN: 47 mg/dL — ABNORMAL HIGH (ref 6–20)
CO2: 26 mmol/L (ref 22–32)
Calcium: 9.8 mg/dL (ref 8.9–10.3)
Chloride: 93 mmol/L — ABNORMAL LOW (ref 101–111)
Creatinine, Ser: 1.31 mg/dL — ABNORMAL HIGH (ref 0.44–1.00)
GFR calc Af Amer: 40 mL/min — ABNORMAL LOW (ref >60)
GFR calc non Af Amer: 35 mL/min — ABNORMAL LOW (ref >60)
Glucose, Bld: 178 mg/dL — ABNORMAL HIGH (ref 65–99)
Potassium: 4.5 mmol/L (ref 3.5–5.1)
Sodium: 131 mmol/L — ABNORMAL LOW (ref 135–145)
Total Bilirubin: 0.6 mg/dL (ref 0.3–1.2)
Total Protein: 7.6 g/dL (ref 6.5–8.1)

## 2016-05-22 LAB — I-STAT VENOUS BLOOD GAS, ED
Acid-Base Excess: 3 mmol/L — ABNORMAL HIGH (ref 0.0–2.0)
Bicarbonate: 28.7 mmol/L — ABNORMAL HIGH (ref 20.0–28.0)
O2 Saturation: 80 %
TCO2: 30 mmol/L (ref 0–100)
pCO2, Ven: 49.7 mmHg (ref 44.0–60.0)
pH, Ven: 7.37 (ref 7.250–7.430)
pO2, Ven: 47 mmHg — ABNORMAL HIGH (ref 32.0–45.0)

## 2016-05-22 LAB — PROTIME-INR
INR: 1.84
INR: 1.9
Prothrombin Time: 21.5 seconds — ABNORMAL HIGH (ref 11.4–15.2)
Prothrombin Time: 22 seconds — ABNORMAL HIGH (ref 11.4–15.2)

## 2016-05-22 LAB — LACTIC ACID, PLASMA
Lactic Acid, Venous: 2.3 mmol/L (ref 0.5–1.9)
Lactic Acid, Venous: 3 mmol/L (ref 0.5–1.9)

## 2016-05-22 LAB — PROCALCITONIN: Procalcitonin: <0.1 ng/mL

## 2016-05-22 LAB — I-STAT CG4 LACTIC ACID, ED: Lactic Acid, Venous: 1.13 mmol/L (ref 0.5–1.9)

## 2016-05-22 LAB — MRSA PCR SCREENING: MRSA by PCR: POSITIVE — AB

## 2016-05-22 LAB — BRAIN NATRIURETIC PEPTIDE: B Natriuretic Peptide: 271 pg/mL — ABNORMAL HIGH (ref 0.0–100.0)

## 2016-05-22 MED ORDER — IPRATROPIUM-ALBUTEROL 0.5-2.5 (3) MG/3ML IN SOLN
3.0000 mL | Freq: Four times a day (QID) | RESPIRATORY_TRACT | Status: DC
  Administered 2016-05-22 – 2016-05-23 (×6): 3 mL via RESPIRATORY_TRACT
  Filled 2016-05-22 (×6): qty 3

## 2016-05-22 MED ORDER — FUROSEMIDE 10 MG/ML IJ SOLN
20.0000 mg | INTRAMUSCULAR | Status: AC
  Administered 2016-05-22: 20 mg via INTRAVENOUS
  Filled 2016-05-22: qty 2

## 2016-05-22 MED ORDER — METHYLPREDNISOLONE SODIUM SUCC 125 MG IJ SOLR
60.0000 mg | Freq: Two times a day (BID) | INTRAMUSCULAR | Status: DC
  Administered 2016-05-22 – 2016-05-23 (×4): 60 mg via INTRAVENOUS
  Filled 2016-05-22 (×5): qty 2

## 2016-05-22 MED ORDER — LATANOPROST 0.005 % OP SOLN
1.0000 [drp] | Freq: Every day | OPHTHALMIC | Status: DC
  Administered 2016-05-22 – 2016-05-29 (×8): 1 [drp] via OPHTHALMIC
  Filled 2016-05-22: qty 2.5

## 2016-05-22 MED ORDER — WARFARIN - PHARMACIST DOSING INPATIENT
Freq: Every day | Status: DC
  Administered 2016-05-22 – 2016-05-26 (×3)

## 2016-05-22 MED ORDER — LEVOTHYROXINE SODIUM 25 MCG PO TABS
137.0000 ug | ORAL_TABLET | Freq: Every day | ORAL | Status: DC
  Administered 2016-05-23 – 2016-05-30 (×8): 137 ug via ORAL
  Filled 2016-05-22 (×8): qty 1

## 2016-05-22 MED ORDER — DEXTROSE 5 % IV SOLN
1.0000 g | Freq: Three times a day (TID) | INTRAVENOUS | Status: DC
  Administered 2016-05-22: 1 g via INTRAVENOUS
  Filled 2016-05-22 (×2): qty 1

## 2016-05-22 MED ORDER — BUDESONIDE 0.5 MG/2ML IN SUSP
0.5000 mg | Freq: Two times a day (BID) | RESPIRATORY_TRACT | Status: DC
  Administered 2016-05-22 – 2016-05-30 (×16): 0.5 mg via RESPIRATORY_TRACT
  Filled 2016-05-22 (×17): qty 2

## 2016-05-22 MED ORDER — PANTOPRAZOLE SODIUM 40 MG IV SOLR
40.0000 mg | Freq: Two times a day (BID) | INTRAVENOUS | Status: DC
  Administered 2016-05-22 – 2016-05-25 (×6): 40 mg via INTRAVENOUS
  Filled 2016-05-22 (×6): qty 40

## 2016-05-22 MED ORDER — ONDANSETRON HCL 4 MG/2ML IJ SOLN: 4.0000 mg | Freq: Four times a day (QID) | INTRAMUSCULAR | Status: DC | PRN

## 2016-05-22 MED ORDER — ORAL CARE MOUTH RINSE
15.0000 mL | Freq: Two times a day (BID) | OROMUCOSAL | Status: DC
  Administered 2016-05-22 – 2016-05-29 (×13): 15 mL via OROMUCOSAL

## 2016-05-22 MED ORDER — OSMOLITE 1.5 CAL PO LIQD
65.0000 mL/h | ORAL | Status: DC
  Administered 2016-05-22 – 2016-05-29 (×8): 65 mL/h
  Filled 2016-05-22 (×11): qty 1000

## 2016-05-22 MED ORDER — BISOPROLOL FUMARATE 5 MG PO TABS
5.0000 mg | ORAL_TABLET | Freq: Every day | ORAL | Status: DC
  Administered 2016-05-23 – 2016-05-25 (×3): 5 mg
  Filled 2016-05-22 (×3): qty 1

## 2016-05-22 MED ORDER — ACETAMINOPHEN 325 MG PO TABS: 650.0000 mg | ORAL_TABLET | Freq: Four times a day (QID) | ORAL | Status: DC | PRN

## 2016-05-22 MED ORDER — MUPIROCIN 2 % EX OINT
1.0000 "application " | TOPICAL_OINTMENT | Freq: Two times a day (BID) | CUTANEOUS | Status: AC
  Administered 2016-05-22 – 2016-05-26 (×10): 1 via NASAL
  Filled 2016-05-22 (×5): qty 22

## 2016-05-22 MED ORDER — ARFORMOTEROL TARTRATE 15 MCG/2ML IN NEBU
15.0000 ug | INHALATION_SOLUTION | Freq: Two times a day (BID) | RESPIRATORY_TRACT | Status: DC
  Administered 2016-05-22 – 2016-05-30 (×16): 15 ug via RESPIRATORY_TRACT
  Filled 2016-05-22 (×17): qty 2

## 2016-05-22 MED ORDER — ATORVASTATIN CALCIUM 10 MG PO TABS
10.0000 mg | ORAL_TABLET | Freq: Every day | ORAL | Status: DC
  Administered 2016-05-22 – 2016-05-24 (×3): 10 mg
  Filled 2016-05-22 (×3): qty 1

## 2016-05-22 MED ORDER — ADULT MULTIVITAMIN W/MINERALS CH
1.0000 | ORAL_TABLET | Freq: Every day | ORAL | Status: DC
  Administered 2016-05-22 – 2016-05-30 (×9): 1 via ORAL
  Filled 2016-05-22 (×9): qty 1

## 2016-05-22 MED ORDER — CHLORHEXIDINE GLUCONATE 0.12 % MT SOLN: 15.0000 mL | Freq: Two times a day (BID) | OROMUCOSAL | Status: DC

## 2016-05-22 MED ORDER — CHLORHEXIDINE GLUCONATE CLOTH 2 % EX PADS
6.0000 | MEDICATED_PAD | Freq: Every day | CUTANEOUS | Status: AC
  Administered 2016-05-23 – 2016-05-27 (×3): 6 via TOPICAL

## 2016-05-22 MED ORDER — CHLORHEXIDINE GLUCONATE 0.12 % MT SOLN
15.0000 mL | Freq: Two times a day (BID) | OROMUCOSAL | Status: DC
  Administered 2016-05-22 – 2016-05-30 (×16): 15 mL via OROMUCOSAL
  Filled 2016-05-22 (×18): qty 15

## 2016-05-22 MED ORDER — ACETAMINOPHEN 650 MG RE SUPP: 650.0000 mg | Freq: Four times a day (QID) | RECTAL | Status: DC | PRN

## 2016-05-22 MED ORDER — ACETAMINOPHEN 325 MG PO TABS: 650.0000 mg | ORAL_TABLET | Freq: Two times a day (BID) | ORAL | Status: DC | PRN

## 2016-05-22 MED ORDER — WARFARIN SODIUM 2 MG PO TABS
4.0000 mg | ORAL_TABLET | Freq: Once | ORAL | Status: AC
  Administered 2016-05-22: 4 mg via ORAL
  Filled 2016-05-22: qty 2

## 2016-05-22 MED ORDER — CYCLOSPORINE 0.05 % OP EMUL
1.0000 [drp] | Freq: Two times a day (BID) | OPHTHALMIC | Status: DC
  Administered 2016-05-22 – 2016-05-24 (×5): 1 [drp] via OPHTHALMIC
  Filled 2016-05-22 (×5): qty 1

## 2016-05-22 MED ORDER — PANTOPRAZOLE SODIUM 40 MG PO TBEC
40.0000 mg | DELAYED_RELEASE_TABLET | Freq: Two times a day (BID) | ORAL | Status: DC
  Administered 2016-05-22: 40 mg via ORAL
  Filled 2016-05-22: qty 1

## 2016-05-22 MED ORDER — FREE WATER
230.0000 mL | Freq: Three times a day (TID) | Status: DC
  Administered 2016-05-22 – 2016-05-30 (×24): 230 mL

## 2016-05-22 MED ORDER — DILTIAZEM HCL ER COATED BEADS 120 MG PO CP24
120.0000 mg | ORAL_CAPSULE | Freq: Every day | ORAL | Status: DC
  Administered 2016-05-22 – 2016-05-30 (×9): 120 mg via ORAL
  Filled 2016-05-22 (×9): qty 1

## 2016-05-22 MED ORDER — HYDRALAZINE HCL 20 MG/ML IJ SOLN
5.0000 mg | INTRAMUSCULAR | Status: DC | PRN
  Administered 2016-05-22: 5 mg via INTRAVENOUS
  Filled 2016-05-22: qty 1

## 2016-05-22 MED ORDER — HYDRALAZINE HCL 50 MG PO TABS
50.0000 mg | ORAL_TABLET | Freq: Three times a day (TID) | ORAL | Status: DC
  Administered 2016-05-22: 50 mg via ORAL
  Filled 2016-05-22: qty 1

## 2016-05-22 MED ORDER — BISOPROLOL FUMARATE 5 MG PO TABS
5.0000 mg | ORAL_TABLET | Freq: Every day | ORAL | Status: DC
  Administered 2016-05-22: 5 mg via ORAL
  Filled 2016-05-22: qty 1

## 2016-05-22 MED ORDER — VANCOMYCIN HCL IN DEXTROSE 1-5 GM/200ML-% IV SOLN
1000.0000 mg | Freq: Once | INTRAVENOUS | Status: AC
  Administered 2016-05-22: 1000 mg via INTRAVENOUS
  Filled 2016-05-22: qty 200

## 2016-05-22 MED ORDER — BENZONATATE 100 MG PO CAPS: 200.0000 mg | ORAL_CAPSULE | Freq: Three times a day (TID) | ORAL | Status: DC | PRN

## 2016-05-22 MED ORDER — RESOURCE THICKENUP CLEAR PO POWD
ORAL | Status: DC | PRN
  Filled 2016-05-22: qty 125

## 2016-05-22 MED ORDER — ISOSORBIDE MONONITRATE ER 30 MG PO TB24
30.0000 mg | ORAL_TABLET | Freq: Every day | ORAL | Status: DC
  Administered 2016-05-22 – 2016-05-30 (×9): 30 mg via ORAL
  Filled 2016-05-22 (×9): qty 1

## 2016-05-22 MED ORDER — ATORVASTATIN CALCIUM 10 MG PO TABS: 10.0000 mg | ORAL_TABLET | Freq: Every day | ORAL | Status: DC

## 2016-05-22 MED ORDER — DEXTROSE 5 % IV SOLN
2.0000 g | Freq: Once | INTRAVENOUS | Status: AC
  Administered 2016-05-22: 2 g via INTRAVENOUS
  Filled 2016-05-22: qty 2

## 2016-05-22 MED ORDER — ONDANSETRON HCL 4 MG PO TABS: 4.0000 mg | ORAL_TABLET | Freq: Four times a day (QID) | ORAL | Status: DC | PRN

## 2016-05-22 MED ORDER — TRAMADOL HCL 50 MG PO TABS: 50.0000 mg | ORAL_TABLET | Freq: Every day | ORAL | Status: DC | PRN

## 2016-05-22 MED ORDER — IPRATROPIUM-ALBUTEROL 0.5-2.5 (3) MG/3ML IN SOLN: 3.0000 mL | RESPIRATORY_TRACT | Status: DC | PRN

## 2016-05-22 MED ORDER — VANCOMYCIN HCL IN DEXTROSE 1-5 GM/200ML-% IV SOLN
1000.0000 mg | INTRAVENOUS | Status: DC
  Administered 2016-05-23 – 2016-05-24 (×2): 1000 mg via INTRAVENOUS
  Filled 2016-05-22 (×2): qty 200

## 2016-05-22 MED ORDER — CEFEPIME HCL 1 G IJ SOLR
1.0000 g | INTRAMUSCULAR | Status: DC
  Administered 2016-05-22 – 2016-05-29 (×8): 1 g via INTRAVENOUS
  Filled 2016-05-22 (×9): qty 1

## 2016-05-22 MED ORDER — HYDRALAZINE HCL 50 MG PO TABS
50.0000 mg | ORAL_TABLET | Freq: Three times a day (TID) | ORAL | Status: DC
  Administered 2016-05-22 – 2016-05-25 (×9): 50 mg
  Filled 2016-05-22 (×9): qty 1

## 2016-05-22 MED ORDER — FLUTICASONE PROPIONATE 50 MCG/ACT NA SUSP
2.0000 | Freq: Every day | NASAL | Status: DC
  Administered 2016-05-22 – 2016-05-30 (×9): 2 via NASAL
  Filled 2016-05-22: qty 16

## 2016-05-22 NOTE — Progress Notes (Signed)
Informed daughter [name] of the need for PPE.  Education done with daughter and patient. Order set for MRSA initiated and hanger with signage placed on door.  MD also made aware.

## 2016-05-22 NOTE — Progress Notes (Signed)
RT NOTE:  Pt transported to 4N11 without event. Report given to [place], RT.

## 2016-05-22 NOTE — ED Notes (Addendum)
After RT suctioned, pt appears more comfortable.

## 2016-05-22 NOTE — H&P (Signed)
History and Physical    Shannon [name] NWG:[id number] DOB: 16-Jun-1923 DOA: 05/22/2016  Referring MD/NP/PA: Dr. Mora Bellman PCP: Shannon Boys, DO  Patient coming from: Nursing facility via EMS  Chief Complaint: Shortness of breath  HPI: Shannon [name] is a 83 y.o. female with medical history significant of HTN, HLD, CVA, COPD/asthma, chronic A.fib, diastolic CHF, DM type 2, hypothyroidism, CAD, bradycardia, gastric outlet obstruction s/p prior G-tube, anemia; who presents with complaints of shortness of breath since last leaving the hospital. History is obtained via reported patient unable to provide her own history at this time due to acute respiratory distress. Hospitalized from 2/5-2/14 for acute respiratory distress and suspected community-acquired pneumonia and treated with full 7 day course of antibiotics. Cardiology stopped amiodarone secondary to suspected lung toxicity. The patient was discharged to skilled nursing facility. Patient Apparently developed worsening shortness of breath for which EMS was called.  ED Course: En route ems had been given 10 mg  albuterol, 1 atrovent, 2 mg magnesium sulfate, 125 mg solumedrol IV. Initially placed on nonrebreather as refused CPAP. Upon admission to the emergency department initial ABG read pH 7.37, pCO2 49.7, p02 47. Lab work revealed WBC 8.3, hemoglobin 11.4, platelets 418, sodium 131, BUN 48, creatinine 1.47, glucose 178, troponin,and BNP 271. Patient was placed on BiPAP with improvement of respiratory distress. Patient was initially given vancomycin and aztreonam for possible healthcare associated pneumonia. However, chest x-ray revealed resolving pneumonia. Urinalysis was clear signs of infection  Review of Systems: As per HPI otherwise 10 point review of systems negative.   Past Medical History:  Diagnosis Date  . Anemia    previous blood transfusions  . Arthritis    "all over"  . Asthma   . Bradycardia    requiring previous d/c of BB and reduction  of amiodarone  . CAD (coronary artery disease)    nonobstructive per notes  . Chronic diastolic CHF (congestive heart failure) (HCC)   . CKD (chronic kidney disease), stage III   . Complication of blood transfusion    "got the wrong blood type at [facility] in ~ 2015; no adverse reaction that we are aware of"/daughter, Shannon [name] (01/27/2016)  . COPD (chronic obstructive pulmonary disease) (HCC)   . Depression    "light case"  . DVT (deep venous thrombosis) (HCC) 01/2016   a. LLE DVT 01/2016 - switched from Eliquis to Coumadin.  . Gastric stenosis    a. s/p stomach tube  . GERD (gastroesophageal reflux disease)   . History of blood transfusion    "several" (01/27/2016)  . History of stomach ulcers   . Hyperlipidemia   . Hypertension   . Hypothyroidism   . Paraesophageal hernia   . Perforated gastric ulcer (HCC)   . Seasonal allergies   . SIADH (syndrome of inappropriate ADH production) (HCC)    Shannon [name] 01/10/2015  . Small bowel obstruction    "I don't know how many" (01/11/2015)  . Stroke (HCC)    "light one"  . Type II diabetes mellitus (HCC)    "related to prednisone use  for > 20 yr; once predinose stopped; no more DM RX" (01/27/2016)  . Ventral hernia with bowel obstruction     Past Surgical History:  Procedure Laterality Date  . CATARACT EXTRACTION W/ INTRAOCULAR LENS  IMPLANT, BILATERAL    . CHOLECYSTECTOMY OPEN    . COLECTOMY    . ESOPHAGOGASTRODUODENOSCOPY N/A 01/19/2014   Procedure: ESOPHAGOGASTRODUODENOSCOPY (EGD);  Surgeon: Hilarie Fredrickson, MD;  Location: [facility] ENDOSCOPY;  Service:  Endoscopy;  Laterality: N/A;  . ESOPHAGOGASTRODUODENOSCOPY N/A 01/20/2014   Procedure: ESOPHAGOGASTRODUODENOSCOPY (EGD);  Surgeon: Hilarie Fredrickson, MD;  Location: [facility] ENDOSCOPY;  Service: Endoscopy;  Laterality: N/A;  . ESOPHAGOGASTRODUODENOSCOPY N/A 03/19/2014   Procedure: ESOPHAGOGASTRODUODENOSCOPY (EGD);  Surgeon: Rachael Fee, MD;  Location: [facility] ENDOSCOPY;  Service: Endoscopy;  Laterality: N/A;   . ESOPHAGOGASTRODUODENOSCOPY N/A 07/08/2015   Procedure: ESOPHAGOGASTRODUODENOSCOPY (EGD);  Surgeon: Sherrilyn Rist, MD;  Location[facility] ENDOSCOPY;  Service: Endoscopy;  Laterality: N/A;  . ESOPHAGOGASTRODUODENOSCOPY (EGD) WITH PROPOFOL N/A 09/15/2015   Procedure: ESOPHAGOGASTRODUODENOSCOPY (EGD) WITH PROPOFOL;  Surgeon: Ruffin Frederick, MD;  Location: [facility] ENDOSCOPY;  Service: Gastroenterology;  Laterality: N/A;  . GASTROJEJUNOSTOMY     hx/notes 01/10/2015  . GASTROJEJUNOSTOMY N/A 09/23/2015   Procedure: OPEN GASTROJEJUNOSTOMY TUBE PLACEMENT;  Surgeon: De Blanch Kinsinger, MD;  Location: [facility] ORS;  Service: General;  Laterality: N/A;  . GLAUCOMA SURGERY Bilateral   . HERNIA REPAIR  2015  . IR GENERIC HISTORICAL  01/07/2016   IR GJ TUBE CHANGE 01/07/2016 Malachy Moan, MD [facility]INTER[facility] RAD  . IR GENERIC HISTORICAL  01/27/2016   IR MECH REMOV OBSTRUC MAT ANY COLON TUBE W/FLUORO 01/27/2016 Richarda Overlie, MD [facility]  . IR GENERIC HISTORICAL  02/07/2016   IR PATIENT EVAL TECH 0-60 MINS Darrell K Allred, PA-C [facility]INTER[facility]  . IR GENERIC HISTORICAL  02/08/2016   IR GJ TUBE CHANGE 02/08/2016 Berdine Dance, MD [facility]  . IR GENERIC HISTORICAL  01/06/2016   IR GJ TUBE CHANGE 01/06/2016 [facility]-RAD OUT REF  . IR GENERIC HISTORICAL  05/02/2016   IR CM INJ ANY COLONIC TUBE W/FLUORO 05/02/2016 Oley Balm, MD [facility]  . IR GENERIC HISTORICAL  05/15/2016   IR GJ TUBE CHANGE 05/15/2016 Simonne Come, MD [facility]  . LAPAROTOMY N/A 01/20/2015   Procedure: EXPLORATORY LAPAROTOMY;  Surgeon: Abigail Miyamoto, MD;  [facility] OR;  Service: General;  Laterality: N/A;  . LYSIS OF ADHESION N/A 01/20/2015   Procedure: LYSIS OF ADHESIONS < 1 HOUR;  Surgeon: Abigail Miyamoto, MD;  Location: [facility] OR;  Service: General;  Laterality: N/A;  . TONSILLECTOMY    . TUBAL LIGATION    . VENTRAL HERNIA REPAIR  2015   incarcerated ventral hernia ([facility] 09/2013)/notes 01/10/2015     reports that she has never smoked. She  has quit using smokeless tobacco. Her smokeless tobacco use included Snuff. She reports that she does not drink alcohol or use drugs.  Allergies  Allergen Reactions  . Penicillins Itching and Rash    Has patient had a PCN reaction causing immediate rash, facial/tongue/throat swelling, SOB or lightheadedness with hypotension: Yes Has patient had a PCN reaction causing severe rash involving mucus membranes or skin necrosis: Unk Has patient had a PCN reaction that required hospitalization: Unk Has patient had a PCN reaction occurring within the last 10 years: Unk If all of the above answers are "NO", then may proceed with Cephalosporin use.     Family History  Problem Relation Age of Onset  . Stroke Mother   . Hypertension Mother   . Diabetes Brother   . Heart attack Neg Hx     Prior to Admission medications   Medication Sig Start Date End Date Taking? Authorizing Provider  acetaminophen (TYLENOL) 325 MG tablet Take 650 mg by mouth 2 (two) times daily as needed for mild pain.     Historical Provider, MD  ADVAIR DISKUS 250-50 MCG/DOSE AEPB INHALE 1 PUFF INTO THE LUNGS 2 (TWO) TIMES DAILY  AS NEEDED FOR SHORTNESS OF BREATH 12/01/14   Historical Provider, MD  AMINO ACIDS-PROTEIN HYDROLYS PO Take 30 mLs by mouth 2 (two) times daily.    Historical Provider, MD  atorvastatin (LIPITOR) 10 MG tablet Take 10 mg by mouth daily.    Historical Provider, MD  atorvastatin (LIPITOR) 10 MG tablet CHOLESTEROL CHECK OVERDUE Take one tablet by mouth once daily 05/18/16   Shannon Boys, DO  b complex vitamins tablet Take 1 tablet by mouth daily.     Historical Provider, MD  benzonatate (TESSALON) 200 MG capsule Take 1 capsule (200 mg total) by mouth 3 (three) times daily as needed for cough. 03/24/16   Shannon Boys, DO  bisacodyl (DULCOLAX) 10 MG suppository Place 1 suppository (10 mg total) rectally daily as needed for moderate constipation. Patient not taking: Reported on 05/08/2016 09/30/15   Calvert Cantor, MD    bisoprolol (ZEBETA) 5 MG tablet Take 1 tablet (5 mg total) by mouth daily. 05/18/16   Jordan Hawks, DO  calcium-vitamin D (OSCAL WITH D) 500-200 MG-UNIT per tablet Take 1 tablet by mouth daily with breakfast.     Historical Provider, MD  cetirizine (ZYRTEC) 10 MG tablet Take 10 mg by mouth daily as needed for allergies or rhinitis.    Historical Provider, MD  chlorhexidine (PERIDEX) 0.12 % solution Use as directed 15 mLs in the mouth or throat 2 (two) times daily.    Historical Provider, MD  diltiazem (CARDIZEM CD) 120 MG 24 hr capsule Take 1 capsule (120 mg total) by mouth daily. 02/23/16 05/23/16  Hillis Range, MD  feeding supplement (BOOST / RESOURCE BREEZE) LIQD Take 1 Container by mouth daily. Patient not taking: Reported on 05/08/2016 11/11/15   Rodolph Bong, MD  feeding supplement, GLUCERNA SHAKE, (GLUCERNA SHAKE) LIQD Take 237 mLs by mouth See admin instructions. One to two times a day    Historical Provider, MD  fluticasone (FLONASE) 50 MCG/ACT nasal spray Place 2 sprays into both nostrils daily. 08/07/14   Shannon Boys, DO  furosemide (LASIX) 40 MG tablet Take 1 tablet (40 mg total) by mouth daily. 05/17/16   Carlton Adam Choi, DO  gabapentin (NEURONTIN) 300 MG capsule Take 1 capsule (300 mg total) by mouth at bedtime. 05/17/16   Carlton Adam Choi, DO  hydrALAZINE (APRESOLINE) 50 MG tablet Take 1 tablet (50 mg total) by mouth 3 (three) times daily. 11/11/15   Rodolph Bong, MD  HYDROcodone-acetaminophen (NORCO) 5-325 MG tablet Take 1 tablet by mouth every 6 (six) hours as needed for moderate pain. 02/18/16   Monica Carter, DO  ipratropium-albuterol (DUONEB) 0.5-2.5 (3) MG/3ML SOLN Take 3 mLs by nebulization every 6 (six) hours as needed (sob). Use 3 times daily x 4 days then every 6 hours as needed. Patient taking differently: Take 3 mLs by nebulization See admin instructions. Every 4 to 6 hours (up to 6 times a day) 03/21/16   Janetta Hora, PA-C  isosorbide  mononitrate (IMDUR) 30 MG 24 hr tablet TAKE 1 TABLET (30 MG TOTAL) BY MOUTH DAILY. 04/13/16   Lars Masson, MD  latanoprost (XALATAN) 0.005 % ophthalmic solution Place 1 drop into both eyes at bedtime. 06/02/14   Oneal Grout, MD  levalbuterol (XOPENEX HFA) 45 MCG/ACT inhaler Inhale 2 puffs into the lungs every 6 (six) hours as needed for wheezing or shortness of breath. 02/03/16   Shannon Boys, DO  levothyroxine (SYNTHROID, LEVOTHROID) 137 MCG tablet TAKE 1 TABLET (137 MCG TOTAL) BY MOUTH DAILY BEFORE BREAKFAST.  04/04/16   Shannon Boys, DO  Maltodextrin-Xanthan Gum (RESOURCE THICKENUP CLEAR) POWD Take 120 g by mouth as needed (use to thicken liquids.). 11/11/15   Rodolph Bong, MD  Multiple Vitamin (MULTIVITAMIN WITH MINERALS) TABS tablet Take 1 tablet by mouth daily.    Historical Provider, MD  Nutritional Supplements (FEEDING SUPPLEMENT, OSMOLITE 1.5 CAL,) LIQD Place 1,000 mLs into feeding tube daily. 09/30/15   Calvert Cantor, MD  pantoprazole (PROTONIX) 40 MG tablet TAKE 1 TABLET BY MOUTH TWICE A DAY 03/09/16   Shannon Boys, DO  Polyethyl Glycol-Propyl Glycol 0.4-0.3 % SOLN Place 1 drop into both eyes 4 (four) times daily.     Historical Provider, MD  polyethylene glycol powder (GLYCOLAX/MIRALAX) powder MIX 17GRAMS IN 8 OUNCES OF LIQUID AND DRINK TWICE DAILY AS NEEDED Patient not taking: Reported on 05/08/2016 09/13/15   Shannon Boys, DO  RESTASIS 0.05 % ophthalmic emulsion USE 1 DROP INTO BOTH EYES TWICE DAILY 11/01/15   Kimber Relic, MD  sertraline (ZOLOFT) 100 MG tablet TAKE ONE TABLET BY MOUTH ONCE DAILY FOR DEPRESSION 04/10/16   Shannon Boys, DO  traMADol (ULTRAM) 50 MG tablet Take 50 mg by mouth daily as needed for moderate pain.     Historical Provider, MD  warfarin (COUMADIN) 2.5 MG tablet Take as directed by [facility]mad[facility] Clinic. Patient taking differently: Take 2.5-3.75 mg by mouth See admin instructions. 3.75 mg at 1800 on Sun/Tues/Wed/Thurs/Fri/Sat and 2.5 mg on Mon 02/16/16   Lars Masson, MD  Water For Irrigation, Sterile (FREE WATER) SOLN Place 230 mLs into feeding tube 3 (three) times daily.    Historical Provider, MD    Physical Exam:  Constitutional: Frail Elderly female currently resting on BiPAP  Vitals:   05/22/16 0058 05/22/16 0100 05/22/16 0130 05/22/16 0200  BP:  150/69 123/55 168/72  Pulse:  71 67 72  Resp:  23 18 20   Temp:  98.1 F (36.7 C)    TempSrc:  Oral    SpO2:  100% 100% 96%  Weight: 62.1 kg (137 lb)     Height: 5\' 6"  (1.676 m)      Eyes: PERRL, lids and conjunctivae normal ENMT: Mucous membranes are dr Posterior pharynx clear of any exudate or lesions.  Neck: normal, supple, no masses, no thyromegaly, + JVD Respiratory: Intermittent rales and rhonchi. Respiratory effort less labored at this time on BiPAP. Cardiovascular: Irregular irregular heart rhythm. +SEM and positive pitting lower extremity edema. Abdomen: no tenderness, no masses palpated. No hepatosplenomegaly. Bowel sounds positive. Peg tube of LLQ Musculoskeletal: no clubbing / cyanosis. No joint deformity upper and lower extremities. Good ROM, no contractures. Normal muscle tone.  Skin: no rashes, lesions, ulcers. No induration Neurologic: CN 2-12 grossly intact. Sensation intact, DTR normal. Strength 5/5 in all 4.  Psychiatric: Normal judgment and insight. Alert and oriented x 3. Normal mood.     Labs on Admission: I have personally reviewed following labs and imaging studies  CBC:  Recent Labs Lab 05/16/16 0343 05/22/16 0101 05/22/16 0117  WBC 6.6 8.3  --   NEUTROABS  --  4.9  --   HGB 9.9* 11.4* 12.6  HCT 29.8* 33.1* 37.0  MCV 82.5 83.0  --   PLT 358 418*  --    Basic Metabolic Panel:  Recent Labs Lab 05/15/16 1027 05/16/16 0343 05/17/16 0407 05/22/16 0101 05/22/16 0117  NA 132* 134* 131* 131* 131*  K 3.8 3.8 3.7 4.5 4.4  CL 95* 94* 93* 93* 96*  CO2 25  26 24 26   --   GLUCOSE 213* 193* 193* 178* 171*  BUN 52* 54* 54* 47* 48*  CREATININE 1.50* 1.46*  1.31* 1.31* 1.30*  CALCIUM 9.3 9.4 9.2 9.8  --    GFR: Estimated Creatinine Clearance: 26.9 mL/min (by C-G formula based on SCr of 1.3 mg/dL (H)). Liver Function Tests:  Recent Labs Lab 05/22/16 0101  AST 50*  ALT 68*  ALKPHOS 87  BILITOT 0.6  PROT 7.6  ALBUMIN 3.5   No results for input(s): LIPASE, AMYLASE in the last 168 hours. No results for input(s): AMMONIA in the last 168 hours. Coagulation Profile:  Recent Labs Lab 05/15/16 0415 05/16/16 0343 05/17/16 0407 05/22/16 0101  INR 2.17 2.33 2.67 1.84   Cardiac Enzymes: No results for input(s): CKTOTAL, CKMB, CKMBINDEX, TROPONINI in the last 168 hours. BNP (last 3 results)  Recent Labs  05/08/16 1053  PROBNP 1,132*   HbA1C: No results for input(s): HGBA1C in the last 72 hours. CBG:  Recent Labs Lab 05/16/16 1152 05/16/16 1651 05/16/16 2049 05/17/16 0807 05/17/16 1132  GLUCAP 154* 179* 184* 202* 160*   Lipid Profile: No results for input(s): CHOL, HDL, LDLCALC, TRIG, CHOLHDL, LDLDIRECT in the last 72 hours. Thyroid Function Tests: No results for input(s): TSH, T4TOTAL, FREET4, T3FREE, THYROIDAB in the last 72 hours. Anemia Panel: No results for input(s): VITAMINB12, FOLATE, FERRITIN, TIBC, IRON, RETICCTPCT in the last 72 hours. Urine analysis:    Component Value Date/Time   COLORURINE YELLOW 05/22/2016 0155   APPEARANCEUR HAZY (A) 05/22/2016 0155   LABSPEC 1.011 05/22/2016 0155   PHURINE 7.0 05/22/2016 0155   GLUCOSEU NEGATIVE 05/22/2016 0155   HGBUR NEGATIVE 05/22/2016 0155   BILIRUBINUR NEGATIVE 05/22/2016 0155   KETONESUR NEGATIVE 05/22/2016 0155   PROTEINUR 100 (A) 05/22/2016 0155   UROBILINOGEN 0.2 01/27/2015 2308   NITRITE NEGATIVE 05/22/2016 0155   LEUKOCYTESUR NEGATIVE 05/22/2016 0155   Sepsis Labs: No results found for this or any previous visit (from the past 240 hour(s)).   Radiological Exams on Admission: Dg Chest Port 1 View  Result Date: 05/22/2016 CLINICAL DATA:  83 year old  female with shortness of breath. Respiratory distress. Recently hospitalized for pneumonia. EXAM: PORTABLE CHEST 1 VIEW COMPARISON:  Chest radiograph 05/11/2016 FINDINGS: There has been interval improvement of the previously seen left lung base airspace opacities. Minimal residual density at the left lung base may represent atelectasis versus residual infiltrate. There is a small left pleural effusion. The right lung is clear. There is no pneumothorax. The cardiac silhouette is within normal limits. There is atherosclerotic calcification of the aortic arch. No acute osseous pathology. IMPRESSION: Interval improvement of the left lung base opacity with near complete resolution. Small left pleural effusion with minimal left lung base atelectasis versus residual infiltrate. Clinical correlation and follow-up recommended. Electronically Signed   By: Elgie Collard M.D.   On: 05/22/2016 01:40    EKG: pending  Assessment/Plan Acute respiratory failure with hypoxia: Question the possibility of aspiration. - Admit to stepdown - Continuous pulse oximetry - Continue BiPAP, wean as tolerated   - Solu-Medrol IV - DuoNebs QID and prn SOB/wheezing - Budesonide and Brovana nebs - Check respiratory viral panel  Question of SIRS/Sepsis: Patient was empirically given vancomycin and aztreonam initially - Empirically initially placed on vancomycin and Zosyn - Follow-up blood cultures and sputum cultures  Diastolic CHF exacerbation: Acute on chronic. BNP elevated at 271 when compared to previous hospitalization. -  Lasix 20 mg 1 dose IV - Continue isosorbide mononitrate  H/o gastric outlet obstruction: with PEG-J tube, declogged by IR 2/7, now clogged again. PEG tube replaced 2/12 per IR  Persistent atrial fibrillation on chronic anticoagulation: Cardiology stop the patient's amiodarone during last hospitalization. INR currently subtherapeutic at 1.86. - Continue diltiazem, bisprolol, and Coumadin per  pharmacy  Essential hypertension -  continue hydralazine  Hypothyroidism: Last TSH was 7.4 - Continue Synthroid  - Recommend follow-up TSH level checked in 3 weeks from last discharge.  Hyponatremia: Chronic. - continue to follow  DVT prophylaxis: lovenox   Code Status: DNR  Family Communication: No family at bedside Disposition Plan: TBD Consults called: none  Admission status: Inpatient  Clydie Braun MD [facility] Pager [phone]  If 7PM-7AM, please contact night-coverage [email or web address] Password [email or web address]  05/22/2016, 2:22 AM

## 2016-05-22 NOTE — ED Notes (Signed)
Pt resting, appears to be more comfortable. No more retractions, still some accessory muscle use. EDP at bedside to re-eval pt

## 2016-05-22 NOTE — Patient Outreach (Signed)
[facility]) Care Management  05/22/2016  Gaylynn Seiple Sep 24, 1923 [id number]     EMMI-PNA RED ON EMMI ALERT Day # 3/4 Date: 05/20/16 & 05/21/16 Red Alert Reason: "Been to follow up appt? No" "Feeling better overall? No" "More SOB than yesterday? Yes" "Wheezing more than yesterday? Yes" "Fevers or chills? Yes" "Swelling in hands/feet or changes in weight? Yes" "Feeling better overall? No" "Been confused? Yes" "Sleeping better than when in hospital? No"    Patient with multiple red alerts on automated post discharge EMMI PNA calls. Patient was readmitted to hospital on 05/22/16.    Plan: RN CM will notify [facility] administrative assistant of case status and to stop automated calls.   Enzo Montgomery, RN,BSN,CCM [facility] Telephonic Care Management Coordinator Direct Phone: [phone] Toll Free: [phone] Fax: [phone]

## 2016-05-22 NOTE — ED Notes (Signed)
Informed pt daughter Soledad Gerlach) that pt was going to be admitted.

## 2016-05-22 NOTE — Progress Notes (Signed)
ANTICOAGULATION CONSULT NOTE - Initial Consult  Pharmacy Consult for Warfarin  Indication: Atrial flutter  Allergies  Allergen Reactions  . Penicillins Itching and Rash    Has patient had a PCN reaction causing immediate rash, facial/tongue/throat swelling, SOB or lightheadedness with hypotension: Yes Has patient had a PCN reaction causing severe rash involving mucus membranes or skin necrosis: Unk Has patient had a PCN reaction that required hospitalization: Unk Has patient had a PCN reaction occurring within the last 10 years: Unk If all of the above answers are "NO", then may proceed with Cephalosporin use.    Patient Measurements: Height: 5\' 6"  (167.6 cm) Weight: 138 lb 14.2 oz (63 kg) IBW/kg (Calculated) : 59.3  Vital Signs: Temp: 98.5 F (36.9 C) (02/19 0428) Temp Source: Oral (02/19 0428) BP: 192/64 (02/19 0435) Pulse Rate: 74 (02/19 0435)  Labs:  Recent Labs  05/22/16 0101 05/22/16 0117  HGB 11.4* 12.6  HCT 33.1* 37.0  PLT 418*  --   LABPROT 21.5*  --   INR 1.84  --   CREATININE 1.31* 1.30*    Estimated Creatinine Clearance: 26.9 mL/min (by C-G formula based on SCr of 1.3 mg/dL (H)).   Medical History: Past Medical History:  Diagnosis Date  . Anemia    previous blood transfusions  . Arthritis    "all over"  . Asthma   . Bradycardia    requiring previous d/c of BB and reduction of amiodarone  . CAD (coronary artery disease)    nonobstructive per notes  . Chronic diastolic CHF (congestive heart failure) ([place])   . CKD (chronic kidney disease), stage III   . Complication of blood transfusion    "got the wrong blood type at [facility] in ~ 2015; no adverse reaction that we are aware of"/daughter, [name] (01/27/2016)  . COPD (chronic obstructive pulmonary disease) ([place])   . Depression    "light case"  . DVT (deep venous thrombosis) ([place]) 01/2016   a. LLE DVT 01/2016 - switched from Eliquis to Coumadin.  . Gastric stenosis    a. s/p stomach tube  .  GERD (gastroesophageal reflux disease)   . History of blood transfusion    "several" (01/27/2016)  . History of stomach ulcers   . Hyperlipidemia   . Hypertension   . Hypothyroidism   . Paraesophageal hernia   . Perforated gastric ulcer ([place])   . Seasonal allergies   . SIADH (syndrome of inappropriate ADH production) ([place])    [name] 01/10/2015  . Small bowel obstruction    "I don't know how many" (01/11/2015)  . Stroke ([place])    "light one"  . Type II diabetes mellitus ([place])    "related to prednisone use  for > 20 yr; once predinose stopped; no more DM RX" (01/27/2016)  . Ventral hernia with bowel obstruction     Assessment: 83 y/o F on warfarin PTA for aflutter, just discharged on 2/14, back to ED with worsening shortness of breath, INR sub-therapeutic at 1.84 on admit  Goal of Therapy:  INR 2-3 Monitor platelets by anticoagulation protocol: Yes   Plan:  -Warfarin 4 mg PO x 1 tonight at 1800 -Daily PT/INR -Monitor for bleeding  [name] 05/22/2016,5:03 AM

## 2016-05-22 NOTE — Progress Notes (Signed)
Initial Nutrition Assessment  INTERVENTION:   Recommend SLP eval for safest PO diet  Continue Osmolite 1.5@ 73ml/hr via J port of GJ tube atrate of 54ml/hr for 12 hours daily from 2000 hr to 0800 hr.  30 ml Prostat once dailyvia J-tube.   Tube feeding regimen provides 1270kcal (94% of needs), 64grams of protein, and 532ml of H2O.   230 ml free water TD Total free water: 1282 ml  Recommend once diet advanced: Glucerna Shake po BID, each supplement provides 220 kcal and 10 grams of protein  NUTRITION DIAGNOSIS:   Inadequate oral intake related to altered GI function as evidenced by  (unable to meet nutrition needs by PO alone).  GOAL:   Patient will meet greater than or equal to 90% of their needs  MONITOR:   Diet advancement, TF tolerance, I & O's  REASON FOR ASSESSMENT:   Consult Enteral/tube feeding initiation and management  ASSESSMENT:   Pt with PMH of HTN, HLD, CVA, COPD/asthma, chronic A.fib, diastolic CHF, DM type 2, hypothyroidism, CAD, bradycardia, gastric outlet obstruction s/p prior G-tube, anemia; who presents with complaints of shortness of breath since last leaving the hospital. Recent hospitalization 2/5-2/14.   Diet from previous admission - Clear Liquids (Nectar thickened)  Unable to complete Nutrition-Focused physical exam at this time as pt is on bedpan. At last admission per RD note pt had moderate muscle depletion on exam.  Pt's weight has remained stable.  Spoke with daughter who reports that pt lives with her. Pt had GJ but has since been converted to J tube only. Pt was having leakage at site and this is why it was changed. Per daughter she used to vent g-tube if pt complained of fullness but is unable to do this now. Pt is eating less that she was at admission on her last visit. Per daughter pt drinks around 1 oz of juice at a time (which she thickens), will drink some soups, and drinks one Glucerna shake per day. She thickens all of her  liquids to nectar-thick unless it is a thick soup or her glucerna.  Pt is NPO for now. Asked RN to put in SLP consult, relayed this to SLP on unit.   Diet Order:  Diet NPO time specified  Skin:  Reviewed, no issues  Last BM:  unknown  Height:   Ht Readings from Last 1 Encounters:  05/22/16 5\' 6"  (1.676 m)    Weight:   Wt Readings from Last 1 Encounters:  05/22/16 138 lb 14.2 oz (63 kg)    Ideal Body Weight:  59 kg  BMI:  Body mass index is 22.42 kg/m.  Estimated Nutritional Needs:   Kcal:  1350-1550  Protein:  75-85 grams  Fluid:  > 1.5 L/day  EDUCATION NEEDS:   No education needs identified at this time  [place], [place], [place] Pager [phone] After Hours Pager

## 2016-05-22 NOTE — ED Notes (Signed)
EDP and RT at bedside, while getting pt on bedpan pt began to struggle to breath, almost as if there was a mucous plug. Suctioned pt. Pt currently remains on bipap.

## 2016-05-22 NOTE — ED Provider Notes (Signed)
[facility] DEPT Provider Note   CSN: [id number] Arrival date & time: 05/22/16  5643   By signing my name below, I, Eunice Blase, attest that this documentation has been prepared under the direction and in the presence of Everlene Balls, MD. Electronically signed, Eunice Blase, ED Scribe. 05/22/16. 1:08 AM.   History   Chief Complaint No chief complaint on file. LEVEL 5 CAVEAT D/T ACUITY OF CONDITION  The history is provided by the patient and medical records. No language interpreter was used.    HPI Comments: Marionna Gonia is a 83 y.o. female BIB EMS who presents to the Emergency Department complaining of gradually worsening SOB x ~5 days. Pt dx'ed with PNA and hospitalized 05/08/2016-05/17/2016, per EMS. Pt arrives with NRB breathing treatment in place with EMS. Triage notes congested cough, wheezing and rhonchi. EMS further notes pt given 10 albuterol, 1 Atrovent, 125 mg solumedrol and 2 mg mag administered en route. Pt reportedly refused CPAP en route.  Past Medical History:  Diagnosis Date  . Anemia    previous blood transfusions  . Arthritis    "all over"  . Asthma   . Bradycardia    requiring previous d/c of BB and reduction of amiodarone  . CAD (coronary artery disease)    nonobstructive per notes  . Chronic diastolic CHF (congestive heart failure) ([place])   . CKD (chronic kidney disease), stage III   . Complication of blood transfusion    "got the wrong blood type at [facility] in ~ 2015; no adverse reaction that we are aware of"/daughter, [name] (01/27/2016)  . COPD (chronic obstructive pulmonary disease) ([place])   . Depression    "light case"  . DVT (deep venous thrombosis) ([place]) 01/2016   a. LLE DVT 01/2016 - switched from Eliquis to Coumadin.  . Gastric stenosis    a. s/p stomach tube  . GERD (gastroesophageal reflux disease)   . History of blood transfusion    "several" (01/27/2016)  . History of stomach ulcers   . Hyperlipidemia   . Hypertension   .  Hypothyroidism   . Paraesophageal hernia   . Perforated gastric ulcer ([place])   . Seasonal allergies   . SIADH (syndrome of inappropriate ADH production) ([place])    [name] 01/10/2015  . Small bowel obstruction    "I don't know how many" (01/11/2015)  . Stroke ([place])    "light one"  . Type II diabetes mellitus ([place])    "related to prednisone use  for > 20 yr; once predinose stopped; no more DM RX" (01/27/2016)  . Ventral hernia with bowel obstruction     Patient Active Problem List   Diagnosis Date Noted  . HCAP (healthcare-associated pneumonia)   . Acute on chronic congestive heart failure ([place])   . CHF (NYHA class IV, ACC/AHA stage D) ([place])   . Advance care planning   . Palliative care by specialist   . Shortness of breath   . Pressure injury of skin 03/11/2016  . PEG (percutaneous endoscopic gastrostomy) status ([place]) 03/10/2016  . Acute on chronic diastolic congestive heart failure ([place]) 03/02/2016  . Encounter for therapeutic drug monitoring 02/16/2016  . Sepsis ([place]) 02/08/2016  . UTI (urinary tract infection) 02/08/2016  . Warfarin anticoagulation   . Chronic seasonal allergic rhinitis   . Sinus pain   . Acute renal insufficiency 01/26/2016  . Acute deep vein thrombosis (DVT) of left lower extremity ([place])   . Acute respiratory failure ([place])   . Respiratory alkalosis   .  Atrial flutter ([place]) 01/24/2016  . Chronic diastolic CHF (congestive heart failure) ([place]) 01/24/2016  . Infection 01/24/2016  . Bilateral leg edema 12/29/2015  . Hypothyroidism due to medication   . Weakness   . Pyloric stenosis   . Pressure ulcer 09/15/2015  . Hyperkalemia 09/12/2015  . Acute osteomyelitis of toe of right foot ([place])   . PVD (peripheral vascular disease) ([place])   . Adjustment disorder with anxiety 08/29/2015  . Diabetic ulcer of toe of right foot associated with type 2 diabetes mellitus, with necrosis of muscle ([place])   . Diverticulitis of large intestine without perforation or abscess  without bleeding   . Colitis 08/26/2015  . Acute on chronic diastolic CHF (congestive heart failure), NYHA class 4 ([place]) 08/02/2015  . Esophageal candidiasis ([place])   . Goals of care, counseling/discussion   . Dyspnea   . SOB (shortness of breath)   . Acute renal failure superimposed on stage 3 chronic kidney disease ([place])   . Encounter for long-term (current) use of other high-risk medications   . Respiratory distress 07/06/2015  . Dysphagia 07/05/2015  . Asthma with allergic rhinitis 06/04/2015  . Hypertensive heart disease 03/18/2015  . Malnutrition of moderate degree 01/28/2015  . Syncope 01/27/2015  . Fever, unspecified 01/22/2015  . Bradycardia 01/21/2015  . Hypotension 01/21/2015  . Palliative care encounter   . DNR (do not resuscitate) discussion   . Protein-calorie malnutrition, severe 01/12/2015  . Elevated troponin 01/12/2015  . HTN (hypertension), uncontrolled 01/12/2015  . AP (abdominal pain) 01/10/2015  . SBO (small bowel obstruction) 01/10/2015  . Cerebrovascular disease 07/14/2014  . Diabetic polyneuropathy associated with type 2 diabetes mellitus ([place]) 07/14/2014  . Hyperlipidemia 07/14/2014  . Gastric outlet obstruction 05/21/2014  . Unstable gait 04/29/2014  . Hyponatremia 03/23/2014  . Acute encephalopathy 03/22/2014  . Slurred speech   . Gastroparesis   . Slow transit constipation 02/03/2014  . Carotid stenosis 02/03/2014  . Demand ischemia ([place]) 01/21/2014  . Gastric bezoar 01/20/2014  . Anemia of chronic disease 01/18/2014  . Chronic kidney insufficiency, stage 3 (moderate) 01/18/2014  . Depression 09/29/2013  . CAP (community acquired pneumonia) 09/29/2013  . Essential hypertension, benign 09/22/2013  . GERD (gastroesophageal reflux disease) 09/22/2013  . Dyslipidemia 09/22/2013  . DM (diabetes mellitus) type II uncontrolled, periph vascular disorder ([place]) 09/22/2013  . Persistent atrial fibrillation ([place]) 09/22/2013  . CVA (cerebral vascular  accident) ([place]) 09/22/2013  . Diabetic neuropathy ([place]) 09/22/2013  . Hypothyroidism 09/22/2013  . Chronic gastrojejunal ulcer with perforation ([place]) 09/07/2013  . Diabetes ([place]) 09/07/2013  . Accelerated hypertension 09/07/2013  . Systemic lupus erythematosus (SLE) inhibitor 09/07/2013  . Other specified abnormal immunological findings in serum 09/07/2013    Past Surgical History:  Procedure Laterality Date  . CATARACT EXTRACTION W/ INTRAOCULAR LENS  IMPLANT, BILATERAL    . CHOLECYSTECTOMY OPEN    . COLECTOMY    . ESOPHAGOGASTRODUODENOSCOPY N/A 01/19/2014   Procedure: ESOPHAGOGASTRODUODENOSCOPY (EGD);  Surgeon: Irene Shipper, MD;  Location: [facility];  Service: Endoscopy;  Laterality: N/A;  . ESOPHAGOGASTRODUODENOSCOPY N/A 01/20/2014   Procedure: ESOPHAGOGASTRODUODENOSCOPY (EGD);  Surgeon: Irene Shipper, MD;  Location: [facility];  Service: Endoscopy;  Laterality: N/A;  . ESOPHAGOGASTRODUODENOSCOPY N/A 03/19/2014   Procedure: ESOPHAGOGASTRODUODENOSCOPY (EGD);  Surgeon: Milus Banister, MD;  Location: [facility];  Service: Endoscopy;  Laterality: N/A;  . ESOPHAGOGASTRODUODENOSCOPY N/A 07/08/2015   Procedure: ESOPHAGOGASTRODUODENOSCOPY (EGD);  Surgeon: Doran Stabler, MD;  Location: [facility] ENDOSCOPY;  Service: Endoscopy;  Laterality: N/A;  .  ESOPHAGOGASTRODUODENOSCOPY (EGD) WITH PROPOFOL N/A 09/15/2015   Procedure: ESOPHAGOGASTRODUODENOSCOPY (EGD) WITH PROPOFOL;  Surgeon: Manus Gunning, MD;  Location: [facility] ENDOSCOPY;  Service: Gastroenterology;  Laterality: N/A;  . GASTROJEJUNOSTOMY     hx/notes 01/10/2015  . GASTROJEJUNOSTOMY N/A 09/23/2015   Procedure: OPEN GASTROJEJUNOSTOMY TUBE PLACEMENT;  Surgeon: Arta Bruce Kinsinger, MD;  Location: [facility] ORS;  Service: General;  Laterality: N/A;  . GLAUCOMA SURGERY Bilateral   . HERNIA REPAIR  2015  . IR GENERIC HISTORICAL  01/07/2016   IR GJ TUBE CHANGE 01/07/2016 Jacqulynn Cadet, MD [facility]  . IR GENERIC HISTORICAL  01/27/2016   IR  MECH REMOV OBSTRUC MAT ANY COLON TUBE W/FLUORO 01/27/2016 Markus Daft, MD [facility]  . IR GENERIC HISTORICAL  02/07/2016   IR PATIENT EVAL TECH 0-60 MINS Darrell K Allred, PA-C [facility]  . IR GENERIC HISTORICAL  02/08/2016   IR GJ TUBE CHANGE 02/08/2016 Greggory Keen, MD [facility]  . IR GENERIC HISTORICAL  01/06/2016   IR GJ TUBE CHANGE 01/06/2016 [facility] OUT REF  . IR GENERIC HISTORICAL  05/02/2016   IR CM INJ ANY COLONIC TUBE W/FLUORO 05/02/2016 Arne Cleveland, MD [facility]  . IR GENERIC HISTORICAL  05/15/2016   IR GJ TUBE CHANGE 05/15/2016 Sandi Mariscal, MD [facility]  . LAPAROTOMY N/A 01/20/2015   Procedure: EXPLORATORY LAPAROTOMY;  Surgeon: Coralie Keens, MD;  Location: [facility];  Service: General;  Laterality: N/A;  . LYSIS OF ADHESION N/A 01/20/2015   Procedure: LYSIS OF ADHESIONS < 1 HOUR;  Surgeon: Coralie Keens, MD;  Location: [facility];  Service: General;  Laterality: N/A;  . TONSILLECTOMY    . TUBAL LIGATION    . VENTRAL HERNIA REPAIR  2015   incarcerated ventral hernia ([facility] 09/2013)/notes 01/10/2015    OB History    No data available       Home Medications    Prior to Admission medications   Medication Sig Start Date End Date Taking? Authorizing Provider  acetaminophen (TYLENOL) 325 MG tablet Take 650 mg by mouth 2 (two) times daily as needed for mild pain.     Historical Provider, MD  ADVAIR DISKUS 250-50 MCG/DOSE AEPB INHALE 1 PUFF INTO THE LUNGS 2 (TWO) TIMES DAILY AS NEEDED FOR SHORTNESS OF BREATH 12/01/14   Historical Provider, MD  AMINO ACIDS-PROTEIN HYDROLYS PO Take 30 mLs by mouth 2 (two) times daily.    Historical Provider, MD  atorvastatin (LIPITOR) 10 MG tablet Take 10 mg by mouth daily.    Historical Provider, MD  atorvastatin (LIPITOR) 10 MG tablet CHOLESTEROL CHECK OVERDUE Take one tablet by mouth once daily 05/18/16   Gildardo Cranker, DO  b complex vitamins tablet Take 1 tablet by mouth daily.     Historical Provider, MD  benzonatate (TESSALON) 200 MG  capsule Take 1 capsule (200 mg total) by mouth 3 (three) times daily as needed for cough. 03/24/16   Gildardo Cranker, DO  bisacodyl (DULCOLAX) 10 MG suppository Place 1 suppository (10 mg total) rectally daily as needed for moderate constipation. Patient not taking: Reported on 05/08/2016 09/30/15   Debbe Odea, MD  bisoprolol (ZEBETA) 5 MG tablet Take 1 tablet (5 mg total) by mouth daily. 05/18/16   Shon Millet, DO  calcium-vitamin D (OSCAL WITH D) 500-200 MG-UNIT per tablet Take 1 tablet by mouth daily with breakfast.     Historical Provider, MD  cetirizine (ZYRTEC) 10 MG tablet Take 10 mg by mouth daily as needed for allergies or rhinitis.  Historical Provider, MD  chlorhexidine (PERIDEX) 0.12 % solution Use as directed 15 mLs in the mouth or throat 2 (two) times daily.    Historical Provider, MD  diltiazem (CARDIZEM CD) 120 MG 24 hr capsule Take 1 capsule (120 mg total) by mouth daily. 02/23/16 05/23/16  Thompson Grayer, MD  feeding supplement (BOOST / RESOURCE BREEZE) LIQD Take 1 Container by mouth daily. Patient not taking: Reported on 05/08/2016 11/11/15   Eugenie Filler, MD  feeding supplement, GLUCERNA SHAKE, (GLUCERNA SHAKE) LIQD Take 237 mLs by mouth See admin instructions. One to two times a day    Historical Provider, MD  fluticasone (FLONASE) 50 MCG/ACT nasal spray Place 2 sprays into both nostrils daily. 08/07/14   Gildardo Cranker, DO  furosemide (LASIX) 40 MG tablet Take 1 tablet (40 mg total) by mouth daily. 05/17/16   Rich Fuchs Choi, DO  gabapentin (NEURONTIN) 300 MG capsule Take 1 capsule (300 mg total) by mouth at bedtime. 05/17/16   Rich Fuchs Choi, DO  hydrALAZINE (APRESOLINE) 50 MG tablet Take 1 tablet (50 mg total) by mouth 3 (three) times daily. 11/11/15   Eugenie Filler, MD  HYDROcodone-acetaminophen (NORCO) 5-325 MG tablet Take 1 tablet by mouth every 6 (six) hours as needed for moderate pain. 02/18/16   Monica Carter, DO  ipratropium-albuterol (DUONEB)  0.5-2.5 (3) MG/3ML SOLN Take 3 mLs by nebulization every 6 (six) hours as needed (sob). Use 3 times daily x 4 days then every 6 hours as needed. Patient taking differently: Take 3 mLs by nebulization See admin instructions. Every 4 to 6 hours (up to 6 times a day) 03/21/16   Eileen Stanford, PA-C  isosorbide mononitrate (IMDUR) 30 MG 24 hr tablet TAKE 1 TABLET (30 MG TOTAL) BY MOUTH DAILY. 04/13/16   Dorothy Spark, MD  latanoprost (XALATAN) 0.005 % ophthalmic solution Place 1 drop into both eyes at bedtime. 06/02/14   Blanchie Serve, MD  levalbuterol (XOPENEX HFA) 45 MCG/ACT inhaler Inhale 2 puffs into the lungs every 6 (six) hours as needed for wheezing or shortness of breath. 02/03/16   Gildardo Cranker, DO  levothyroxine (SYNTHROID, LEVOTHROID) 137 MCG tablet TAKE 1 TABLET (137 MCG TOTAL) BY MOUTH DAILY BEFORE BREAKFAST. 04/04/16   Gildardo Cranker, DO  Maltodextrin-Xanthan Gum (RESOURCE THICKENUP CLEAR) POWD Take 120 g by mouth as needed (use to thicken liquids.). 11/11/15   Eugenie Filler, MD  Multiple Vitamin (MULTIVITAMIN WITH MINERALS) TABS tablet Take 1 tablet by mouth daily.    Historical Provider, MD  Nutritional Supplements (FEEDING SUPPLEMENT, OSMOLITE 1.5 CAL,) LIQD Place 1,000 mLs into feeding tube daily. 09/30/15   Debbe Odea, MD  pantoprazole (PROTONIX) 40 MG tablet TAKE 1 TABLET BY MOUTH TWICE A DAY 03/09/16   Gildardo Cranker, DO  Polyethyl Glycol-Propyl Glycol 0.4-0.3 % SOLN Place 1 drop into both eyes 4 (four) times daily.     Historical Provider, MD  polyethylene glycol powder (GLYCOLAX/MIRALAX) powder MIX 17GRAMS IN 8 OUNCES OF LIQUID AND DRINK TWICE DAILY AS NEEDED Patient not taking: Reported on 05/08/2016 09/13/15   Gildardo Cranker, DO  RESTASIS 0.05 % ophthalmic emulsion USE 1 DROP INTO BOTH EYES TWICE DAILY 11/01/15   Estill Dooms, MD  sertraline (ZOLOFT) 100 MG tablet TAKE ONE TABLET BY MOUTH ONCE DAILY FOR DEPRESSION 04/10/16   Gildardo Cranker, DO  traMADol (ULTRAM) 50 MG tablet Take 50  mg by mouth daily as needed for moderate pain.     Historical Provider, MD  warfarin (COUMADIN) 2.5  MG tablet Take as directed by [facility]. Patient taking differently: Take 2.5-3.75 mg by mouth See admin instructions. 3.75 mg at 1800 on Sun/Tues/Wed/Thurs/Fri/Sat and 2.5 mg on Mon 02/16/16   Dorothy Spark, MD  Water For Irrigation, Sterile (FREE WATER) SOLN Place 230 mLs into feeding tube 3 (three) times daily.    Historical Provider, MD    Family History Family History  Problem Relation Age of Onset  . Stroke Mother   . Hypertension Mother   . Diabetes Brother   . Heart attack Neg Hx     Social History Social History  Substance Use Topics  . Smoking status: Never Smoker  . Smokeless tobacco: Former Systems developer    Types: Snuff     Comment: "used snuff in my younger days"  . Alcohol use No     Allergies   Penicillins   Review of Systems Review of Systems  All other systems reviewed and are negative.  A complete 10 system review of systems was obtained and all systems are negative except as noted in the HPI and PMH.    Physical Exam Updated Vital Signs BP 150/69 (BP Location: Right Arm)   Pulse 71   Temp 98.1 F (36.7 C) (Oral)   Resp 18   Ht 5\' 6"  (1.676 m)   Wt 137 lb (62.1 kg)   SpO2 100%   BMI 22.11 kg/m   Physical Exam  Constitutional: She is oriented to person, place, and time. She appears well-developed and well-nourished. She appears distressed.  HENT:  Head: Normocephalic and atraumatic.  Nose: Nose normal.  Mouth/Throat: Oropharynx is clear and moist. No oropharyngeal exudate.  Eyes: Conjunctivae and EOM are normal. Pupils are equal, round, and reactive to light. No scleral icterus.  Neck: Normal range of motion. Neck supple. No JVD present. No tracheal deviation present. No thyromegaly present.  Cardiovascular: Normal rate, regular rhythm and normal heart sounds.  Exam reveals no gallop and no friction rub.   No murmur heard. Pulmonary/Chest:  Accessory muscle usage present. Tachypnea noted. She is in respiratory distress. She has decreased breath sounds. She has no wheezes. She has rhonchi. She has rales. She exhibits no tenderness.  NRB mask in place. PEG tube in the LUQ; bilateral lower extremity edema  Abdominal: Soft. Bowel sounds are normal. She exhibits no distension and no mass. There is no tenderness. There is no rebound and no guarding.  Musculoskeletal: Normal range of motion. She exhibits no edema or tenderness.  Lymphadenopathy:    She has no cervical adenopathy.  Neurological: She is alert and oriented to person, place, and time. No cranial nerve deficit. She exhibits normal muscle tone.  Skin: Skin is warm and dry. No rash noted. No erythema. No pallor.  Nursing note and vitals reviewed.   ED Treatments / Results  DIAGNOSTIC STUDIES: Oxygen Saturation is 88% on RA, low by my interpretation.    COORDINATION OF CARE: 12:57 AM Discussed treatment plan with pt at bedside and pt agreed to plan.  Labs (all labs ordered are listed, but only abnormal results are displayed) Labs Reviewed - No data to display  EKG  EKG Interpretation None       Radiology No results found.  Procedures Procedures (including critical care time)  Medications Ordered in ED Medications - No data to display   Initial Impression / Assessment and Plan / ED Course  I have reviewed the triage vital signs and the nursing notes.  Pertinent labs & imaging results that  were available during my care of the patient were reviewed by me and considered in my medical decision making (see chart for details).     Patient presents to the ED for resp distress.  Initially 85% on RA.  She is doingm uch better on bipap.  Recent pneumonia diagnosis.  CXR shows significant improvement.  Code sepsis was called and she was already given broad spectrum abx.  No wheezing on exam.  She will require admission for further care. I spoke with Dr. Tamala Julian who will  admit the patient to step down   CRITICAL CARE Performed by: Krystine Pabst   Total critical care time: 45 minutes - resp distress  Critical care time was exclusive of separately billable procedures and treating other patients.  Critical care was necessary to treat or prevent imminent or life-threatening deterioration.  Critical care was time spent personally by me on the following activities: development of treatment plan with patient and/or surrogate as well as nursing, discussions with consultants, evaluation of patient's response to treatment, examination of patient, obtaining history from patient or surrogate, ordering and performing treatments and interventions, ordering and review of laboratory studies, ordering and review of radiographic studies, pulse oximetry and re-evaluation of patient's condition.   I personally performed the services described in this documentation, which was scribed in my presence. The recorded information has been reviewed and is accurate.     Final Clinical Impressions(s) / ED Diagnoses   Final diagnoses:  None    New Prescriptions New Prescriptions   No medications on file     Everlene Balls, MD 05/22/16 [phone]

## 2016-05-22 NOTE — Progress Notes (Signed)
Patient admitted after midnight. For details please refer to admission note done 05/22/2016  83 year old female with past medical history significant for hypertension, dyslipidemia, CVA, COPD and asthma, chronic atrial fibrillation, diastolic congestive heart failure, diabetes, hypothyroidism, gastric outlet obstruction with prior G2. Patient presented with shortness of breath ever since she left the hospital 05/17/2016.[name] Last hospitalization from 05/08/2016 through 05/17/2016 for community-acquired pneumonia and respiratory failure.  Patient was given nebulizer treatments, BiPAP but continued to be short of breath for which reason she was admitted she was given vancomycin and aztreonam for possible healthcare associated pneumonia but her chest x-ray actually revealed resolving pneumonia. Urinalysis was negative for leukocytes.  Assessment and plan:  Acute on chronic respiratory failure with hypoxia - Likely combination of pneumonia as well as COPD exacerbation - Respiratory team following, will try to wean off of BiPAP - Continue Brovana and Pulmicort nebulizer twice daily - Continue DuoNeb nebulizer 4 times daily as well as every 2 hours as needed for shortness of breath or wheezing  Sepsis / Healthcare associated pneumonia - Sepsis criteria met on the admission with tachypnea, hypoxia, lactic acidosis of 2.3 and 3 - Source of infection likely healthcare associated pneumonia considering patient's last recent hospitalization within 5 days - Continue azithromycin and and vancomycin - Follow up blood culture results - Respiratory virus panel is pending  Leisa Lenz [facility] [phone]

## 2016-05-22 NOTE — ED Triage Notes (Signed)
BIB EMS from home, in for resp distress. Recently hospitalized for pneumonia from 2/5-2/14. Has had inc SOB ever since leaving. Pt has congested cough, wheezing and rhonci. Given 10 albuterol, 1 atrovent, 2 mag, 125 solumedrol. EDP and RT at bedside, preparing for bipap

## 2016-05-22 NOTE — Progress Notes (Signed)
ANTIBIOTIC CONSULT NOTE - INITIAL  Pharmacy Consult for Vanco/Aztreonam Indication: pneumonia  Allergies  Allergen Reactions  . Penicillins Itching and Rash    Has patient had a PCN reaction causing immediate rash, facial/tongue/throat swelling, SOB or lightheadedness with hypotension: Yes Has patient had a PCN reaction causing severe rash involving mucus membranes or skin necrosis: Unk Has patient had a PCN reaction that required hospitalization: Unk Has patient had a PCN reaction occurring within the last 10 years: Unk If all of the above answers are "NO", then may proceed with Cephalosporin use.     Patient Measurements: Height: 5\' 6"  (167.6 cm) Weight: 137 lb (62.1 kg) IBW/kg (Calculated) : 59.3 Adjusted Body Weight:    Vital Signs: Temp: 98.1 F (36.7 C) (02/19 0100) Temp Source: Oral (02/19 0100) BP: 150/69 (02/19 0100) Pulse Rate: 71 (02/19 0100) Intake/Output from previous day: No intake/output data recorded. Intake/Output from this shift: No intake/output data recorded.  Labs: No results for input(s): WBC, HGB, PLT, LABCREA, CREATININE in the last 72 hours. Estimated Creatinine Clearance: 26.7 mL/min (by C-G formula based on SCr of 1.31 mg/dL (H)). No results for input(s): VANCOTROUGH, VANCOPEAK, VANCORANDOM, GENTTROUGH, GENTPEAK, GENTRANDOM, TOBRATROUGH, TOBRAPEAK, TOBRARND, AMIKACINPEAK, AMIKACINTROU, AMIKACIN in the last 72 hours.   Microbiology:   Medical History: Past Medical History:  Diagnosis Date  . Anemia    previous blood transfusions  . Arthritis    "all over"  . Asthma   . Bradycardia    requiring previous d/c of BB and reduction of amiodarone  . CAD (coronary artery disease)    nonobstructive per notes  . Chronic diastolic CHF (congestive heart failure) ([place])   . CKD (chronic kidney disease), stage III   . Complication of blood transfusion    "got the wrong blood type at [facility] in ~ 2015; no adverse reaction that we are aware  of"/daughter, [name] (01/27/2016)  . COPD (chronic obstructive pulmonary disease) ([place])   . Depression    "light case"  . DVT (deep venous thrombosis) ([place]) 01/2016   a. LLE DVT 01/2016 - switched from Eliquis to Coumadin.  . Gastric stenosis    a. s/p stomach tube  . GERD (gastroesophageal reflux disease)   . History of blood transfusion    "several" (01/27/2016)  . History of stomach ulcers   . Hyperlipidemia   . Hypertension   . Hypothyroidism   . Paraesophageal hernia   . Perforated gastric ulcer ([place])   . Seasonal allergies   . SIADH (syndrome of inappropriate ADH production) ([place])    [name] 01/10/2015  . Small bowel obstruction    "I don't know how many" (01/11/2015)  . Stroke ([place])    "light one"  . Type II diabetes mellitus ([place])    "related to prednisone use  for > 20 yr; once predinose stopped; no more DM RX" (01/27/2016)  . Ventral hernia with bowel obstruction     Medications: see med rec  Assessment: CC/HPI: Worsening Respiratory distress after recent hospital discharge. Afebrile, Scr 1.3 (unchanged), WBC 8.3, LA 1.13  Significant events: Recently hospitalized for pneumonia from 2/5-2/14   Goal of Therapy:  Vancomycin trough level 15-20 mcg/ml  Plan:  Vancomycin 1g IV x 1 in ED, then 750mg  IV q24h Aztreonam 2g IV x 1 in ED, then 1g IV q8hr   Patsey Pitstick S. Alford Highland, PharmD, BCPS Clinical Staff Pharmacist Pager [phone]  Eilene Ghazi Stillinger 05/22/2016,1:09 AM

## 2016-05-22 NOTE — Progress Notes (Signed)
MD notified of Lactic Acid of 2.3. WIll continue to monitor. Gildardo Cranker, RN

## 2016-05-23 DIAGNOSIS — J690 Pneumonitis due to inhalation of food and vomit: Secondary | ICD-10-CM

## 2016-05-23 DIAGNOSIS — E038 Other specified hypothyroidism: Secondary | ICD-10-CM

## 2016-05-23 LAB — PROTIME-INR
INR: 1.91
Prothrombin Time: 22.1 seconds — ABNORMAL HIGH (ref 11.4–15.2)

## 2016-05-23 LAB — URINE CULTURE: Culture: NO GROWTH

## 2016-05-23 MED ORDER — IPRATROPIUM-ALBUTEROL 0.5-2.5 (3) MG/3ML IN SOLN
3.0000 mL | Freq: Three times a day (TID) | RESPIRATORY_TRACT | Status: DC
  Administered 2016-05-23 – 2016-05-28 (×15): 3 mL via RESPIRATORY_TRACT
  Filled 2016-05-23 (×16): qty 3

## 2016-05-23 MED ORDER — WARFARIN SODIUM 5 MG PO TABS
5.0000 mg | ORAL_TABLET | Freq: Once | ORAL | Status: AC
  Administered 2016-05-23: 5 mg via ORAL
  Filled 2016-05-23: qty 1

## 2016-05-23 MED ORDER — ARTIFICIAL TEARS OP OINT: TOPICAL_OINTMENT | Freq: Every evening | OPHTHALMIC | Status: DC | PRN

## 2016-05-23 MED ORDER — NEPRO/CARBSTEADY PO LIQD
237.0000 mL | Freq: Two times a day (BID) | ORAL | Status: DC
  Administered 2016-05-23 – 2016-05-30 (×14): 237 mL via ORAL
  Filled 2016-05-23 (×17): qty 237

## 2016-05-23 MED ORDER — METOPROLOL TARTRATE 5 MG/5ML IV SOLN: 2.5000 mg | Freq: Three times a day (TID) | INTRAVENOUS | Status: DC

## 2016-05-23 NOTE — Progress Notes (Signed)
Nutrition Follow-up  DOCUMENTATION CODES:   Not applicable  INTERVENTION:   -Continue Osmolite 1.5@ 43ml/hr via J port of GJ tube atrate of 79ml/hr for 12 hours daily from 2000 hr to 0800 hr.  30 ml Prostat once dailyvia J-tube.   Tube feeding regimen provides 1270kcal (94% of needs), 64grams of protein, and 534ml of H2O.   230 ml free water TD  -Continue MVI daily -Nepro Shake po BID, each supplement provides 425 kcal and 19 grams protein  NUTRITION DIAGNOSIS:   Inadequate oral intake related to altered GI function as evidenced by  (unable to meet nutrition needs by PO alone).  Progressing  GOAL:   Patient will meet greater than or equal to 90% of their needs  Progressing  MONITOR:   Diet advancement, TF tolerance, I & O's  REASON FOR ASSESSMENT:   Rounds Enteral/tube feeding initiation and management  ASSESSMENT:   Pt with PMH of HTN, HLD, CVA, COPD/asthma, chronic A.fib, diastolic CHF, DM type 2, hypothyroidism, CAD, bradycardia, gastric outlet obstruction s/p prior G-tube, anemia; who presents with complaints of shortness of breath since last leaving the hospital. Recent hospitalization 2/5-2/14.   Pt seen per request of RN and SLP; pt is interested in which supplements are most appropriate for her.   Per discussion with SLP, pt has been advanced to a full liquid diet (nectar thick liquids only). However, pt has been allowed some non-traditional nectar thick foods such as jello. Pt is a chronically high aspiration risk. SLP reports that she will follow-up with family education.  Spoke with pt and daughter at bedside. They report that pt consumes several small meals daily at home- mainly supplements, soup, jello, and ice cream /milkshakes. Pt has been rotating supplements, such as Boost Breeze and Glucerna. Pt was sipping on a Nepro shake at time of visit, which she also liked. Discussed common nectar thick liquids. Pt is willing to continue Nepro due to  natural nectar thick consistency, nutrient density, and preference. Discussed importance of thickening liquids and supplements to appropriate consistency. Advised against using Boost Breeze, as product has the tendency not to thicken well. Pt daughter reports she purchases most of pt's supplements online.   Spoke with RN, who reports pt is tolerating TF well. Pt continues to receive Osmolite 1.5@ 30ml/hr via J port of GJ tube atrate of 74ml/hr for 12 hours daily from 2000 hr to 0800 hr and 30 ml Prostat once dailyvia J-tube. Tube feeding regimen provides 1270kcal (94% of needs), 64grams of protein, and 554ml of H2O.   Labs reviewed.   Diet Order:  Diet full liquid Room service appropriate? Yes; Fluid consistency: Nectar Thick  Skin:  Reviewed, no issues  Last BM:  unknown  Height:   Ht Readings from Last 1 Encounters:  05/22/16 5\' 6"  (1.676 m)    Weight:   Wt Readings from Last 1 Encounters:  05/22/16 138 lb 14.2 oz (63 kg)    Ideal Body Weight:  59 kg  BMI:  Body mass index is 22.42 kg/m.  Estimated Nutritional Needs:   Kcal:  1350-1550  Protein:  75-85 grams  Fluid:  > 1.5 L/day  EDUCATION NEEDS:   No education needs identified at this time  Kayloni Rocco A. Jimmye Norman, RD, LDN, CDE Pager: [phone] After hours Pager: [phone]

## 2016-05-23 NOTE — Progress Notes (Signed)
Dr. Charlies Silvers attempted to call back but tech had phone.  Repaged again.

## 2016-05-23 NOTE — Progress Notes (Addendum)
Patient ID: Shannon [name], female   DOB: [birth year], 83 y.o.   MRN: [id number]  PROGRESS NOTE    Shannon [name]  AYT:[id number] DOB: 09-29-1923 DOA: 05/22/2016  PCP: Kirt Boys, DO   Brief Narrative:  83 year old female with past medical history significant for hypertension, dyslipidemia, CVA, COPD and asthma, chronic atrial fibrillation, diastolic congestive heart failure, diabetes, hypothyroidism, gastric outlet obstruction with prior G2. Patient presented with shortness of breath ever since she left the hospital 05/17/2016.[name] Last hospitalization from 05/08/2016 through 05/17/2016 for community-acquired pneumonia and respiratory failure.  Patient was given nebulizer treatments, BiPAP but continued to be short of breath for which reason she was admitted she was given vancomycin and aztreonam for possible healthcare associated pneumonia but her chest x-ray actually revealed resolving pneumonia. Urinalysis was negative for leukocytes.  Assessment & Plan:  Acute on chronic respiratory failure with hypoxia / Acute COPD exacerbation - Likely combination of pneumonia as well as COPD exacerbation, aspiration - Respiratory team following, will try to wean off of BiPAP - Continue Brovana and Pulmicort nebulizer twice daily - Continue solumedrol 40 mg IV Q 12 hours  - Continue DuoNeb nebulizer 3 times daily as well as every 2 hours as needed for shortness of breath or wheezing - Continue oxygen support via  to keep O2 sats above 90%  Aspiration pneumonitis - She is on tube feeds with minimal PO intake at home - Concern that tube feeds are causing more issues at this time as she is constatntly coughing - Will have SLP eval  - Continue cefepime and vancomycin  Sepsis / Healthcare associated pneumonia / Lactic acidosis  - Sepsis criteria met on the admission with tachypnea, hypoxia, lactic acidosis of 2.3 and 3 - Source of infection likely healthcare associated pneumonia considering patient's  last recent hospitalization within 5 days - Continue cefepime and vancomycin - Follow up blood culture results - Respiratory virus panel is negative   Hypothyroidism - Continue synthroid   Chronic atrial fibrillation - CHADS vasc score 6 - On anticoagulation with coumadin - Rate controlled with bisoprolol and Cardizem  Chronic diastolic CHF - EF 60% based on 2 D ECHO 05/09/2016 - BNP in 200 on this admission - She does not appear to be volume overloaded as no edema in LE and as a amtter of fact looks a bit dry clinically  - Continue current meds  Chronic kidney disease stage 3 - Cr on this admission 1.31, at baseline compared with recent creatinine values   Essential hypertension - Continue Cardizem and bisoprolol - Continue hydralazine and Imdur   Dyslipidemia - Continue statin therapy   DVT prophylaxis: On Coumadin  Code Status: DNR/DNI Family Communication: spoke with pt daughter 2/19 Disposition Plan: home once medically stable, needs SLP at this time, also her resp status not yet at baseline and therefore she is not yet ready for discharge    Consultants:   SLP  Procedures:   BiPAP  Antimicrobials:   Vanco and cefepime 05/22/2016 -->   Subjective: More coughing this am.  Objective: Vitals:   05/23/16 1300 05/23/16 1400 05/23/16 1500 05/23/16 1600  BP:   (!) 158/70   Pulse: 80 79 79 76  Resp: 20 (!) 23 12 (!) 21  Temp:   97.8 F (36.6 C)   TempSrc:   Oral   SpO2: 97% 98% 96% 98%  Weight:      Height:        Intake/Output Summary (Last 24 hours) at 05/23/16 1625 Last data  filed at 05/23/16 1500  Gross per 24 hour  Intake          2309.75 ml  Output              850 ml  Net          1459.75 ml   Filed Weights   05/22/16 0058 05/22/16 0428 05/22/16 0455  Weight: 62.1 kg (137 lb) 63 kg (138 lb 14.2 oz) 63 kg (138 lb 14.2 oz)    Examination:  General exam: Appears calm and comfortable  Respiratory system: Congested, rhonchorous    Cardiovascular system: S1 & S2 heard, Rate controlled  Gastrointestinal system: Abdomen is nondistended, soft and nontender. No organomegaly or masses felt. Normal bowel sounds heard. Central nervous system: No focal neurological deficits. Extremities: No swelling, palpable pulses  Skin: No rashes, lesions or ulcers Psychiatry: No agitation or restlessness   Data Reviewed: I have personally reviewed following labs and imaging studies  CBC:  Recent Labs Lab 05/22/16 0101 05/22/16 0117  WBC 8.3  --   NEUTROABS 4.9  --   HGB 11.4* 12.6  HCT 33.1* 37.0  MCV 83.0  --   PLT 418*  --    Basic Metabolic Panel:  Recent Labs Lab 05/17/16 0407 05/22/16 0101 05/22/16 0117  NA 131* 131* 131*  K 3.7 4.5 4.4  CL 93* 93* 96*  CO2 24 26  --   GLUCOSE 193* 178* 171*  BUN 54* 47* 48*  CREATININE 1.31* 1.31* 1.30*  CALCIUM 9.2 9.8  --    GFR: Estimated Creatinine Clearance: 26.9 mL/min (by C-G formula based on SCr of 1.3 mg/dL (H)). Liver Function Tests:  Recent Labs Lab 05/22/16 0101  AST 50*  ALT 68*  ALKPHOS 87  BILITOT 0.6  PROT 7.6  ALBUMIN 3.5   No results for input(s): LIPASE, AMYLASE in the last 168 hours. No results for input(s): AMMONIA in the last 168 hours. Coagulation Profile:  Recent Labs Lab 05/17/16 0407 05/22/16 0101 05/22/16 0717 05/23/16 0634  INR 2.67 1.84 1.90 1.91   Cardiac Enzymes: No results for input(s): CKTOTAL, CKMB, CKMBINDEX, TROPONINI in the last 168 hours. BNP (last 3 results)  Recent Labs  05/08/16 1053  PROBNP 1,132*   HbA1C: No results for input(s): HGBA1C in the last 72 hours. CBG:  Recent Labs Lab 05/16/16 1651 05/16/16 2049 05/17/16 0807 05/17/16 1132  GLUCAP 179* 184* 202* 160*   Lipid Profile: No results for input(s): CHOL, HDL, LDLCALC, TRIG, CHOLHDL, LDLDIRECT in the last 72 hours. Thyroid Function Tests: No results for input(s): TSH, T4TOTAL, FREET4, T3FREE, THYROIDAB in the last 72 hours. Anemia  Panel: No results for input(s): VITAMINB12, FOLATE, FERRITIN, TIBC, IRON, RETICCTPCT in the last 72 hours. Urine analysis:    Component Value Date/Time   COLORURINE YELLOW 05/22/2016 0155   APPEARANCEUR HAZY (A) 05/22/2016 0155   LABSPEC 1.011 05/22/2016 0155   PHURINE 7.0 05/22/2016 0155   GLUCOSEU NEGATIVE 05/22/2016 0155   HGBUR NEGATIVE 05/22/2016 0155   BILIRUBINUR NEGATIVE 05/22/2016 0155   KETONESUR NEGATIVE 05/22/2016 0155   PROTEINUR 100 (A) 05/22/2016 0155   UROBILINOGEN 0.2 01/27/2015 2308   NITRITE NEGATIVE 05/22/2016 0155   LEUKOCYTESUR NEGATIVE 05/22/2016 0155   Sepsis Labs: @LABRCNTIP (procalcitonin:4,lacticidven:4)  Blood Culture (routine x 2)     Status: None (Preliminary result)   Collection Time: 05/22/16  1:01 AM  Result Value Ref Range Status   Specimen Description BLOOD RIGHT FOREARM  Final   Culture NO GROWTH 1 DAY  Final   Report Status PENDING  Incomplete  Urine culture     Status: None   Collection Time: 05/22/16  1:55 AM  Result Value Ref Range Status   Specimen Description URINE, CLEAN CATCH  Final   Special Requests NONE  Final   Culture NO GROWTH  Final   Report Status 05/23/2016 FINAL  Final  MRSA PCR Screening     Status: Abnormal   Collection Time: 05/22/16  4:27 AM  Result Value Ref Range Status   MRSA by PCR POSITIVE (A) NEGATIVE Final  Blood Culture (routine x 2)     Status: None (Preliminary result)   Collection Time: 05/22/16  4:51 AM  Result Value Ref Range Status   Specimen Description BLOOD RIGHT HAND  Final   Culture NO GROWTH 1 DAY  Final   Report Status PENDING  Incomplete  Respiratory Panel by PCR     Status: None   Collection Time: 05/22/16 10:30 AM  Result Value Ref Range Status   Adenovirus NOT DETECTED NOT DETECTED Final   Coronavirus 229E NOT DETECTED NOT DETECTED Final   Coronavirus HKU1 NOT DETECTED NOT DETECTED Final   Coronavirus NL63 NOT DETECTED NOT DETECTED Final   Coronavirus OC43 NOT DETECTED NOT DETECTED  Final   Metapneumovirus NOT DETECTED NOT DETECTED Final   Rhinovirus / Enterovirus NOT DETECTED NOT DETECTED Final   Influenza A NOT DETECTED NOT DETECTED Final   Influenza B NOT DETECTED NOT DETECTED Final   Parainfluenza Virus 1 NOT DETECTED NOT DETECTED Final   Parainfluenza Virus 2 NOT DETECTED NOT DETECTED Final   Parainfluenza Virus 3 NOT DETECTED NOT DETECTED Final   Parainfluenza Virus 4 NOT DETECTED NOT DETECTED Final   Respiratory Syncytial Virus NOT DETECTED NOT DETECTED Final   Bordetella pertussis NOT DETECTED NOT DETECTED Final   Chlamydophila pneumoniae NOT DETECTED NOT DETECTED Final   Mycoplasma pneumoniae NOT DETECTED NOT DETECTED Final      Radiology Studies: Dg Chest Port 1 View Result Date: 05/22/2016  Interval improvement of the left lung base opacity with near complete resolution. Small left pleural effusion with minimal left lung base atelectasis versus residual infiltrate. Clinical correlation and follow-up recommended.   Scheduled Meds: . arformoterol  15 mcg Nebulization BID  . atorvastatin  10 mg Per Tube q1800  . bisoprolol  5 mg Per Tube Daily  . budesonide   0.5 mg Nebulization BID  . ceFEPime  1 g Intravenous Q24H  . diltiazem  120 mg Oral Daily  . feeding supplement (NEPRO CARB STEADY)  237 mL Oral BID BM  . feeding supplement OSMOLITE   65 mL/hr Per Tube Q24H  . fluticasone  2 spray Each Nare Daily  . free water  230 mL Per Tube TID  . hydrALAZINE  50 mg Per Tube TID  . ipratropium-albuter  3 mL Nebulization TID  . isosorbide mononitrate  30 mg Oral Daily  . levothyroxine  137 mcg Oral QAC breakfast  . methylPREDNISolo  60 mg Intravenous Q12H  . multivitamin with minerals  1 tablet Oral Daily  . pantoprazole (PROTONIX) IV  40 mg Intravenous Q12H  . vancomycin  1,000 mg Intravenous Q24H  . warfarin  5 mg Oral ONCE-1800   Continuous Infusions:   LOS: 1 day    Time spent: 25 minutes  Greater than 50% of the time spent on counseling and  coordinating the care.   Manson Passey, MD [facility] Pager [phone]  If 7PM-7AM, please contact night-coverage [email or web address]  Password [email or web address] 05/23/2016, 4:25 PM

## 2016-05-23 NOTE — Consult Note (Signed)
Consultation Note Date: 05/23/2016   Patient Name: Shannon [name]  DOB: 23-Aug-1923  MRN: [id number]  Age / Sex: 83 y.o., female  PCP: Kirt Boys, DO Referring Physician: Alison Murray, MD  Reason for Consultation: Establishing goals of care  HPI/Patient Profile: 83 y.o. female    admitted on 05/22/2016     Clinical Assessment and Goals of Care:  83 year old female with past medical history significant for hypertension, dyslipidemia, CVA, COPD and asthma, chronic atrial fibrillation, diastolic congestive heart failure, diabetes, hypothyroidism, gastric outlet obstruction with prior G2. Patient presented with shortness of breath ever since she left the hospital 05/17/2016.[name] Last hospitalization from 05/08/2016 through 05/17/2016 for community-acquired pneumonia and respiratory failure.  Patient was given nebulizer treatments, BiPAP but continued to be short of breath for which reason she was admitted she was given vancomycin and aztreonam for possible healthcare associated pneumonia but her chest x-ray actually revealed resolving pneumonia.   Patient has been seen by palliative service in the past, once in May 2017 in the context of a hospitalization for SBO, osteomyelitis, then again in December 2017 when she was hospitalized for respiratory issues. Patient has a PEG tube, she is well taken care of by her daughter and grand daughter. They are from the medical profession and aware of hospice and palliative services.   Patient was with acute dyspnea earlier this am, she is now awake alert breathing comfortably, in no distress. There is no family present at the bedside, see recommendations below:   HCPOA  daughter [name].   SUMMARY OF RECOMMENDATIONS    continue scope of current hospitalization Wishes to go home with home health Goals are not palliative/ hospice oriented right now, patient is a wonderful lady  who states she wishes to make it to 71 next month, knows that her birthday is [birth year].  We will continue to follow peripherally Thank you for the consult.   Code Status/Advance Care Planning:  DNR    Symptom Management:    as above   Palliative Prophylaxis:   Bowel Regimen   Psycho-social/Spiritual:   Desire for further Chaplaincy support:no  Additional Recommendations: Caregiving  Support/Resources  Prognosis:   Guarded.   Discharge Planning: Home with Home Health      Primary Diagnoses: Present on Admission: . Acute respiratory failure with hypoxia (HCC) . Hypothyroidism due to medication . Hyponatremia . Persistent atrial fibrillation (HCC) . Chronic diastolic CHF (congestive heart failure) (HCC)   I have reviewed the medical record, interviewed the patient and family, and examined the patient. The following aspects are pertinent.  Past Medical History:  Diagnosis Date  . Anemia    previous blood transfusions  . Arthritis    "all over"  . Asthma   . Bradycardia    requiring previous d/c of BB and reduction of amiodarone  . CAD (coronary artery disease)    nonobstructive per notes  . Chronic diastolic CHF (congestive heart failure) (HCC)   . CKD (chronic kidney disease), stage III   . Complication of blood transfusion    "  got the wrong blood type at [facility] in ~ 2015; no adverse reaction that we are aware of"/daughter, [name] (01/27/2016)  . COPD (chronic obstructive pulmonary disease) (HCC)   . Depression    "light case"  . DVT (deep venous thrombosis) (HCC) 01/2016   a. LLE DVT 01/2016 - switched from Eliquis to Coumadin.  . Gastric stenosis    a. s/p stomach tube  . GERD (gastroesophageal reflux disease)   . History of blood transfusion    "several" (01/27/2016)  . History of stomach ulcers   . Hyperlipidemia   . Hypertension   . Hypothyroidism   . Paraesophageal hernia   . Perforated gastric ulcer (HCC)   . Seasonal allergies   .  SIADH (syndrome of inappropriate ADH production) (HCC)    [name] 01/10/2015  . Small bowel obstruction    "I don't know how many" (01/11/2015)  . Stroke (HCC)    "light one"  . Type II diabetes mellitus (HCC)    "related to prednisone use  for > 20 yr; once predinose stopped; no more DM RX" (01/27/2016)  . Ventral hernia with bowel obstruction    Social History   Social History  . Marital status: Widowed    Spouse name: N/A  . Number of children: 3  . Years of education: N/A   Social History Main Topics  . Smoking status: Never Smoker  . Smokeless tobacco: Former Neurosurgeon    Types: Snuff     Comment: "used snuff in my younger days"  . Alcohol use No  . Drug use: No  . Sexual activity: No   Other Topics Concern  . None   Social History Narrative   Married in [place], lives in one story house with 2 people and no pets   Occupation: ?   Has a living Will, [place], and doesn't have DNR   Was living with Husband (in frail health) in [place] until 09/2013.  After her [facility] admission they both came to live with daughter in [place]   Family History  Problem Relation Age of Onset  . Stroke Mother   . Hypertension Mother   . Diabetes Brother   . Heart attack Neg Hx    Scheduled Meds: . arformoterol  15 mcg Nebulization BID  . atorvastatin  10 mg Per Tube q1800  . bisoprolol  5 mg Per Tube Daily  . budesonide (PULMICORT) nebulizer solution  0.5 mg Nebulization BID  . ceFEPime (MAXIPIME) IV  1 g Intravenous Q24H  . chlorhexidine  15 mL Mouth Rinse BID  . Chlorhexidine Gluconate Cloth  6 each Topical Q0600  . cycloSPORINE  1 drop Both Eyes BID  . diltiazem  120 mg Oral Daily  . feeding supplement (NEPRO CARB STEADY)  237 mL Oral BID BM  . feeding supplement (OSMOLITE 1.5 CAL)  65 mL/hr Per Tube Q24H  . fluticasone  2 spray Each Nare Daily  . free water  230 mL Per Tube TID  . hydrALAZINE  50 mg Per Tube TID  . ipratropium-albuterol  3 mL Nebulization TID  . isosorbide  mononitrate  30 mg Oral Daily  . latanoprost  1 drop Both Eyes QHS  . levothyroxine  137 mcg Oral QAC breakfast  . mouth rinse  15 mL Mouth Rinse q12n4p  . methylPREDNISolone (SOLU-MEDROL) injection  60 mg Intravenous Q12H  . multivitamin with minerals  1 tablet Oral Daily  . mupirocin ointment  1 application Nasal BID  . pantoprazole (PROTONIX) IV  40 mg Intravenous Q12H  . vancomycin  1,000 mg Intravenous Q24H  . warfarin  5 mg Oral ONCE-1800  . Warfarin - Pharmacist Dosing Inpatient   Does not apply q1800   Continuous Infusions: PRN Meds:.acetaminophen **OR** acetaminophen, benzonatate, hydrALAZINE, ipratropium-albuterol, [DISCONTINUED] ondansetron **OR** ondansetron (ZOFRAN) IV, RESOURCE THICKENUP CLEAR, traMADol Medications Prior to Admission:  Prior to Admission medications   Medication Sig Start Date End Date Taking? Authorizing Provider  AMINO ACIDS-PROTEIN HYDROLYS PO Take 30 mLs by mouth 2 (two) times daily.   Yes Historical Provider, MD  atorvastatin (LIPITOR) 10 MG tablet TAKE ONE TABLET BY MOUTH ONCE DAILY 05/22/16  Yes Kirt Boys, DO  b complex vitamins tablet Take 1 tablet by mouth daily.    Yes Historical Provider, MD  benzonatate (TESSALON) 200 MG capsule Take 1 capsule (200 mg total) by mouth 3 (three) times daily as needed for cough. 03/24/16  Yes Kirt Boys, DO  bisoprolol (ZEBETA) 5 MG tablet Take 1 tablet (5 mg total) by mouth daily. 05/18/16  Yes Jennifer Chahn-Yang Choi, DO  calcium-vitamin D (OSCAL WITH D) 500-200 MG-UNIT per tablet Take 1 tablet by mouth daily with breakfast.    Yes Historical Provider, MD  chlorhexidine (PERIDEX) 0.12 % solution Use as directed 15 mLs in the mouth or throat 2 (two) times daily.   Yes Historical Provider, MD  diltiazem (CARDIZEM CD) 120 MG 24 hr capsule Take 1 capsule (120 mg total) by mouth daily. 02/23/16 05/23/16 Yes Hillis Range, MD  feeding supplement, GLUCERNA SHAKE, (GLUCERNA SHAKE) LIQD Take 237 mLs by mouth See admin  instructions. One to two times a day   Yes Historical Provider, MD  fluticasone (FLONASE) 50 MCG/ACT nasal spray Place 2 sprays into both nostrils daily. 08/07/14  Yes Kirt Boys, DO  furosemide (LASIX) 40 MG tablet Take 1 tablet (40 mg total) by mouth daily. 05/17/16  Yes Jennifer Chahn-Yang Choi, DO  gabapentin (NEURONTIN) 300 MG capsule Take 1 capsule (300 mg total) by mouth at bedtime. 05/17/16  Yes Jennifer Chahn-Yang Choi, DO  hydrALAZINE (APRESOLINE) 50 MG tablet Take 1 tablet (50 mg total) by mouth 3 (three) times daily. 11/11/15  Yes Rodolph Bong, MD  ipratropium-albuterol (DUONEB) 0.5-2.5 (3) MG/3ML SOLN Take 3 mLs by nebulization every 6 (six) hours as needed (sob). Use 3 times daily x 4 days then every 6 hours as needed. Patient taking differently: Take 3 mLs by nebulization See admin instructions. Every 4 to 6 hours (up to 6 times a day) 03/21/16  Yes Janetta Hora, PA-C  isosorbide mononitrate (IMDUR) 30 MG 24 hr tablet TAKE 1 TABLET (30 MG TOTAL) BY MOUTH DAILY. 04/13/16  Yes Lars Masson, MD  latanoprost (XALATAN) 0.005 % ophthalmic solution Place 1 drop into both eyes at bedtime. 06/02/14  Yes Mahima Glade Lloyd, MD  levalbuterol [facility] HFA) 45 MCG/ACT inhaler Inhale 2 puffs into the lungs every 6 (six) hours as needed for wheezing or shortness of breath. 02/03/16  Yes Monica Carter, DO  levofloxacin (LEVAQUIN) 500 MG tablet Take 500 mg by mouth daily. 05/19/16  Yes Historical Provider, MD  levothyroxine (SYNTHROID, LEVOTHROID) 137 MCG tablet TAKE 1 TABLET (137 MCG TOTAL) BY MOUTH DAILY BEFORE BREAKFAST. 04/04/16  Yes Kirt Boys, DO  loratadine (CLARITIN) 10 MG tablet Take 10 mg by mouth daily.   Yes Historical Provider, MD  Maltodextrin-Xanthan Gum (RESOURCE THICKENUP CLEAR) POWD Take 120 g by mouth as needed (use to thicken liquids.). 11/11/15  Yes Rodolph Bong, MD  Multiple  Vitamin (MULTIVITAMIN WITH MINERALS) TABS tablet Take 1 tablet by mouth daily.   Yes Historical  Provider, MD  Nutritional Supplements (FEEDING SUPPLEMENT, OSMOLITE 1.5 CAL,) LIQD Place 1,000 mLs into feeding tube daily. 09/30/15  Yes Calvert Cantor, MD  pantoprazole (PROTONIX) 40 MG tablet TAKE 1 TABLET BY MOUTH TWICE A DAY 03/09/16  Yes Kirt Boys, DO  Polyethyl Glycol-Propyl Glycol 0.4-0.3 % SOLN Place 1 drop into both eyes 4 (four) times daily.    Yes Historical Provider, MD  RESTASIS 0.05 % ophthalmic emulsion USE 1 DROP INTO BOTH EYES TWICE DAILY 11/01/15  Yes Kimber Relic, MD  sertraline (ZOLOFT) 100 MG tablet TAKE ONE TABLET BY MOUTH ONCE DAILY FOR DEPRESSION 04/10/16  Yes Kirt Boys, DO  warfarin (COUMADIN) 2.5 MG tablet Take as directed by [facility] Clinic. Patient taking differently: Take 2.5 mg by mouth daily.  02/16/16  Yes Lars Masson, MD  Water For Irrigation, Sterile (FREE WATER) SOLN Place 230 mLs into feeding tube 3 (three) times daily.   Yes Historical Provider, MD  acetaminophen (TYLENOL) 325 MG tablet Take 650 mg by mouth 2 (two) times daily as needed for mild pain.     Historical Provider, MD  atorvastatin (LIPITOR) 10 MG tablet CHOLESTEROL CHECK OVERDUE Take one tablet by mouth once daily Patient not taking: Reported on 05/22/2016 05/18/16   Kirt Boys, DO  bisacodyl (DULCOLAX) 10 MG suppository Place 1 suppository (10 mg total) rectally daily as needed for moderate constipation. Patient not taking: Reported on 05/08/2016 09/30/15   Calvert Cantor, MD  feeding supplement (BOOST / RESOURCE BREEZE) LIQD Take 1 Container by mouth daily. Patient not taking: Reported on 05/08/2016 11/11/15   Rodolph Bong, MD  HYDROcodone-acetaminophen [facility]) 5-325 MG tablet Take 1 tablet by mouth every 6 (six) hours as needed for moderate pain. 02/18/16   Kirt Boys, DO  polyethylene glycol powder (GLYCOLAX/MIRALAX) powder MIX 17GRAMS IN 8 OUNCES OF LIQUID AND DRINK TWICE DAILY AS NEEDED Patient not taking: Reported on 05/08/2016 09/13/15   Kirt Boys, DO  traMADol (ULTRAM) 50 MG  tablet Take 50 mg by mouth daily as needed for moderate pain.     Historical Provider, MD   Allergies  Allergen Reactions  . Penicillins Itching, Rash and Other (See Comments)    Has patient had a PCN reaction causing immediate rash, facial/tongue/throat swelling, SOB or lightheadedness with hypotension: Yes Has patient had a PCN reaction causing severe rash involving mucus membranes or skin necrosis: Unk Has patient had a PCN reaction that required hospitalization: Unk Has patient had a PCN reaction occurring within the last 10 years: Unk If all of the above answers are "NO", then may proceed with Cephalosporin use.    Review of Systems + for dyspnea earlier today, none currently.   Physical Exam Elderly lady resting in bed Some scattered rhonchi S1 S2 Abdomen soft Thin extremities Awake alert Non focal  Vital Signs: BP (!) 158/70 (BP Location: Right Arm)   Pulse 76   Temp 97.8 F (36.6 C) (Oral)   Resp (!) 21   Ht 5\' 6"  (1.676 m)   Wt 63 kg (138 lb 14.2 oz)   SpO2 98%   BMI 22.42 kg/m  Pain Assessment: No/denies pain POSS *See Group Information*: 1-Acceptable,Awake and alert Pain Score: 0-No pain   SpO2: SpO2: 98 % O2 Device:SpO2: 98 % O2 Flow Rate: .O2 Flow Rate (L/min): 3 L/min  IO: Intake/output summary:  Intake/Output Summary (Last 24 hours) at 05/23/16 1629 Last data  filed at 05/23/16 1500  Gross per 24 hour  Intake          2309.75 ml  Output              850 ml  Net          1459.75 ml    LBM: Last BM Date:  (pta) Baseline Weight: Weight: 62.1 kg (137 lb) Most recent weight: Weight: 63 kg (138 lb 14.2 oz)     Palliative Assessment/Data:   Flowsheet Rows   Flowsheet Row Most Recent Value  Intake Tab  Referral Department  Hospitalist  Unit at Time of Referral  ICU  Palliative Care Primary Diagnosis  Pulmonary  Date Notified  05/22/16  Palliative Care Type  Return patient Palliative Care  Reason for referral  Clarify Goals of Care  Date of  Admission  05/22/16  # of days IP prior to Palliative referral  0  Clinical Assessment  Psychosocial & Spiritual Assessment  Palliative Care Outcomes      Time In:  10 Time Out:  11 Time Total:  60 min  Greater than 50%  of this time was spent counseling and coordinating care related to the above assessment and plan.  Signed by: Rosalin Hawking, MD  [phone]  Please contact Palliative Medicine Team phone at [phone] for questions and concerns.  For individual provider: See Loretha Stapler

## 2016-05-23 NOTE — Progress Notes (Signed)
Pt been c/o eye pain and burning.  Requested eye ointment.  Text paged Dr. Charlies Silvers.

## 2016-05-23 NOTE — Progress Notes (Signed)
Shannon [name] for Warfarin  Indication: Atrial flutter  Allergies  Allergen Reactions  . Penicillins Itching, Rash and Other (See Comments)    Has patient had a PCN reaction causing immediate rash, facial/tongue/throat swelling, SOB or lightheadedness with hypotension: Yes Has patient had a PCN reaction causing severe rash involving mucus membranes or skin necrosis: Unk Has patient had a PCN reaction that required hospitalization: Unk Has patient had a PCN reaction occurring within the last 10 years: Unk If all of the above answers are "NO", then may proceed with Cephalosporin use.    Patient Measurements: Height: 5\' 6"  (167.6 cm) Weight: 138 lb 14.2 oz (63 kg) IBW/kg (Calculated) : 59.3  Vital Signs: Temp: 99.1 F (37.3 C) (02/20 0736) Temp Source: Oral (02/20 0736) BP: 157/83 (02/20 0736) Pulse Rate: 82 (02/20 0736)  Labs:  Recent Labs  05/22/16 0101 05/22/16 0117 05/22/16 0717 05/23/16 0634  HGB 11.4* 12.6  --   --   HCT 33.1* 37.0  --   --   PLT 418*  --   --   --   LABPROT 21.5*  --  22.0* 22.1*  INR 1.84  --  1.90 1.91  CREATININE 1.31* 1.30*  --   --     Estimated Creatinine Clearance: 26.9 mL/min (by C-G formula based on SCr of 1.3 mg/dL (H)).   Medical History: Past Medical History:  Diagnosis Date  . Anemia    previous blood transfusions  . Arthritis    "all over"  . Asthma   . Bradycardia    requiring previous d/c of BB and reduction of amiodarone  . CAD (coronary artery disease)    nonobstructive per notes  . Chronic diastolic CHF (congestive heart failure) ([place])   . CKD (chronic kidney disease), stage III   . Complication of blood transfusion    "got the wrong blood type at [facility] in ~ 2015; no adverse reaction that we are aware of"/daughter, [name] (01/27/2016)  . COPD (chronic obstructive pulmonary disease) ([place])   . Depression    "light case"  . DVT (deep venous thrombosis) ([place]) 01/2016   a. LLE DVT  01/2016 - switched from Eliquis to Coumadin.  . Gastric stenosis    a. s/p stomach tube  . GERD (gastroesophageal reflux disease)   . History of blood transfusion    "several" (01/27/2016)  . History of stomach ulcers   . Hyperlipidemia   . Hypertension   . Hypothyroidism   . Paraesophageal hernia   . Perforated gastric ulcer ([place])   . Seasonal allergies   . SIADH (syndrome of inappropriate ADH production) ([place])    [name] 01/10/2015  . Small bowel obstruction    "I don't know how many" (01/11/2015)  . Stroke ([place])    "light one"  . Type II diabetes mellitus ([place])    "related to prednisone use  for > 20 yr; once predinose stopped; no more DM RX" (01/27/2016)  . Ventral hernia with bowel obstruction     Assessment: 83 y/o F on warfarin PTA for aflutter, just discharged on 2/14, back to ED with worsening shortness of breath. Pharmacy consulted to dose coumadin -INR= 1.91  Goal of Therapy:  INR 2-3 Monitor platelets by anticoagulation protocol: Yes   Plan:  -Warfarin 5 mg PO x 1 tonight at 1800 -Daily PT/INR  Hildred Laser, Pharm D 05/23/2016 10:10 AM

## 2016-05-23 NOTE — Care Management Note (Signed)
Case Management Note  Patient Details  Name: Shannon [name] MRN: [id number] Date of Birth: 1923/06/08  Subjective/Objective:    Adm w hypoxia                Action/Plan:lives w fam, act w ahc, sw offered snf but fam prefers to take home  Expected Discharge Date:                  Expected Discharge Plan:  [place]  In-House Referral:  Clinical Social Work  Discharge planning Services  CM Consult  Post Acute Care Choice:  Resumption of Svcs/PTA Provider Choice offered to:     DME Arranged:    DME Agency:     HH Arranged:  RN [place] Agency:  [place]  Status of Service:  In process, will continue to follow  If discussed at Long Length of Stay Meetings, dates discussed:    Additional Comments:alerted [facility] of pt's adm Shannon Leigh, RN 05/23/2016, 2:00 PM

## 2016-05-23 NOTE — Progress Notes (Signed)
Pt sob at rest.  Unable to complete sentences.  States, "worse than it has been"  Increased wob noted.  Lungs diminished.  duoneb given at this time.  Paged Dr. Charlies Silvers.

## 2016-05-23 NOTE — Progress Notes (Signed)
[facility]  Patient Status: Active HRI Effective 03/10/2016 Discharged from [place] per daughter's request  [facility] is providing the following services: RN PT SW  If patient discharges after hours, please call [phone].   Cindie J Sillmon 05/23/2016, 4:18 PM  SW Navigator [facility]) [phone]

## 2016-05-24 LAB — BASIC METABOLIC PANEL
Anion gap: 10 (ref 5–15)
BUN: 54 mg/dL — ABNORMAL HIGH (ref 6–20)
CO2: 24 mmol/L (ref 22–32)
Calcium: 9.3 mg/dL (ref 8.9–10.3)
Chloride: 96 mmol/L — ABNORMAL LOW (ref 101–111)
Creatinine, Ser: 1.23 mg/dL — ABNORMAL HIGH (ref 0.44–1.00)
GFR calc Af Amer: 43 mL/min — ABNORMAL LOW (ref >60)
GFR calc non Af Amer: 37 mL/min — ABNORMAL LOW (ref >60)
Glucose, Bld: 398 mg/dL — ABNORMAL HIGH (ref 65–99)
Potassium: 4.3 mmol/L (ref 3.5–5.1)
Sodium: 130 mmol/L — ABNORMAL LOW (ref 135–145)

## 2016-05-24 LAB — CBC
HCT: 30.1 % — ABNORMAL LOW (ref 36.0–46.0)
Hemoglobin: 10.1 g/dL — ABNORMAL LOW (ref 12.0–15.0)
MCH: 27.4 pg (ref 26.0–34.0)
MCHC: 33.6 g/dL (ref 30.0–36.0)
MCV: 81.8 fL (ref 78.0–100.0)
Platelets: 399 10*3/uL (ref 150–400)
RBC: 3.68 MIL/uL — ABNORMAL LOW (ref 3.87–5.11)
RDW: 18.8 % — ABNORMAL HIGH (ref 11.5–15.5)
WBC: 18.2 10*3/uL — ABNORMAL HIGH (ref 4.0–10.5)

## 2016-05-24 LAB — PROTIME-INR
INR: 1.93
Prothrombin Time: 22.3 seconds — ABNORMAL HIGH (ref 11.4–15.2)

## 2016-05-24 LAB — GLUCOSE, CAPILLARY
Glucose-Capillary: 276 mg/dL — ABNORMAL HIGH (ref 65–99)
Glucose-Capillary: 158 mg/dL — ABNORMAL HIGH (ref 65–99)

## 2016-05-24 MED ORDER — CYCLOSPORINE 0.05 % OP EMUL
1.0000 [drp] | Freq: Four times a day (QID) | OPHTHALMIC | Status: DC
  Administered 2016-05-24 – 2016-05-30 (×24): 1 [drp] via OPHTHALMIC
  Filled 2016-05-24 (×27): qty 1

## 2016-05-24 MED ORDER — METHYLPREDNISOLONE SODIUM SUCC 125 MG IJ SOLR
60.0000 mg | INTRAMUSCULAR | Status: DC
  Administered 2016-05-24: 60 mg via INTRAVENOUS
  Filled 2016-05-24: qty 2

## 2016-05-24 MED ORDER — INSULIN ASPART 100 UNIT/ML ~~LOC~~ SOLN
0.0000 [IU] | Freq: Three times a day (TID) | SUBCUTANEOUS | Status: DC
  Administered 2016-05-24: 5 [IU] via SUBCUTANEOUS
  Administered 2016-05-25: 2 [IU] via SUBCUTANEOUS
  Administered 2016-05-25: 3 [IU] via SUBCUTANEOUS
  Administered 2016-05-26: 9 [IU] via SUBCUTANEOUS
  Administered 2016-05-26: 3 [IU] via SUBCUTANEOUS
  Administered 2016-05-26: 7 [IU] via SUBCUTANEOUS
  Administered 2016-05-27: 3 [IU] via SUBCUTANEOUS
  Administered 2016-05-27: 7 [IU] via SUBCUTANEOUS
  Administered 2016-05-27: 2 [IU] via SUBCUTANEOUS
  Administered 2016-05-28 – 2016-05-30 (×5): 5 [IU] via SUBCUTANEOUS

## 2016-05-24 MED ORDER — BACITRACIN-POLYMYXIN B 500-10000 UNIT/GM OP OINT
TOPICAL_OINTMENT | Freq: Two times a day (BID) | OPHTHALMIC | Status: DC
  Administered 2016-05-24: 23:00:00 via OPHTHALMIC
  Administered 2016-05-25: 1 via OPHTHALMIC
  Administered 2016-05-25 – 2016-05-29 (×9): via OPHTHALMIC
  Administered 2016-05-30: 1 via OPHTHALMIC
  Filled 2016-05-24: qty 3.5

## 2016-05-24 MED ORDER — WARFARIN SODIUM 5 MG PO TABS
5.0000 mg | ORAL_TABLET | Freq: Once | ORAL | Status: AC
  Administered 2016-05-24: 5 mg via ORAL
  Filled 2016-05-24: qty 1

## 2016-05-24 MED ORDER — POLYVINYL ALCOHOL 1.4 % OP SOLN
1.0000 [drp] | Freq: Four times a day (QID) | OPHTHALMIC | Status: DC
  Administered 2016-05-24 – 2016-05-30 (×22): 1 [drp] via OPHTHALMIC
  Filled 2016-05-24: qty 15

## 2016-05-24 NOTE — Progress Notes (Signed)
Speech Language Pathology Treatment: Dysphagia  Patient Details Name: Shannon [name] MRN: [id number] DOB: 1924-02-18 Today's Date: 05/24/2016 Time: 1100-1120 SLP Time Calculation (min) (ACUTE ONLY): 20 min  Assessment / Plan / Recommendation Clinical Impression  Pt and daughter seen to discuss diet and recommendations reiterate precautions and strategies. Daughter and pt wonder what they can do to keep from getting pneumonia again. SLP reiterated that it is not ceratint hat pts pneumonia is related to swallowing, but she has many risk factors that could contribute to an aspiration pneumonia. Reiterated that she is on the safest PO diet at this time and she is doing everything right that she can do to reduce risk. Despite this, risk will be persistent due to risk of postprandial aspiration or aspiraiton of secretions. The only other option is to eliminate PO or stick to a strict water protocol, which pt and daughter confirm would impact her quality of life too significantly. SLP discussed appropriate thickening strategies, precautions, importance of oral care. All education complete at this time. Will sign off.   HPI HPI: 83 year old female with past medical history significant for hypertension, dyslipidemia, CVA, COPD and asthma, chronic atrial fibrillation, diastolic congestive heart failure, diabetes, hypothyroidism, gastric outlet obstruction with prior Gtube. Patient presented with shortness of breath ever since she left the hospital 05/17/2016. Last hospitalization from 05/08/2016 through 05/17/2016 for community-acquired pneumonia and respiratory failure. Pt currently has a J tube though she has previously taken nectar thick, clear liquids, soups, glucerna orally as well. Her daughter reported that When pt had a GJ tube she was able to drain residuals, but cannot do that since tube is now only a J tube and pt often complains of fullness. Pt has a history of dysphagia, last MBS on 05/15/16 recommended  thin liquids only if pt can do a chin tuck, otherwise nectar thick liquids due ot silent aspiration of thin.       SLP Plan  All goals met       Recommendations  Diet recommendations: Nectar-thick liquid Liquids provided via: Cup;Straw Medication Administration: Via alternative means Supervision: Patient able to self feed Compensations: Slow rate;Small sips/bites Postural Changes and/or Swallow Maneuvers: Chin tuck                Plan: All goals met       GO               Harlon Ditty, MA CCC-SLP [phone]  Claudine Mouton 05/24/2016, 11:23 AM

## 2016-05-24 NOTE — Progress Notes (Signed)
[facility] for Warfarin  Indication: Atrial flutter  Allergies  Allergen Reactions  . Penicillins Itching, Rash and Other (See Comments)    Has patient had a PCN reaction causing immediate rash, facial/tongue/throat swelling, SOB or lightheadedness with hypotension: Yes Has patient had a PCN reaction causing severe rash involving mucus membranes or skin necrosis: Unk Has patient had a PCN reaction that required hospitalization: Unk Has patient had a PCN reaction occurring within the last 10 years: Unk If all of the above answers are "NO", then may proceed with Cephalosporin use.    Patient Measurements: Height: 5\' 6"  (167.6 cm) Weight: 138 lb 14.2 oz (63 kg) IBW/kg (Calculated) : 59.3  Vital Signs: Temp: 97.9 F (36.6 C) (02/21 0811) Temp Source: Axillary (02/21 0811) BP: 181/82 (02/21 0811) Pulse Rate: 83 (02/21 0811)  Labs:  Recent Labs  05/22/16 0101 05/22/16 0117 05/22/16 0717 05/23/16 0634 05/24/16 0340  HGB 11.4* 12.6  --   --  10.1*  HCT 33.1* 37.0  --   --  30.1*  PLT 418*  --   --   --  399  LABPROT 21.5*  --  22.0* 22.1* 22.3*  INR 1.84  --  1.90 1.91 1.93  CREATININE 1.31* 1.30*  --   --  1.23*    Estimated Creatinine Clearance: 28.5 mL/min (by C-G formula based on SCr of 1.23 mg/dL (H)).   Medical History: Past Medical History:  Diagnosis Date  . Anemia    previous blood transfusions  . Arthritis    "all over"  . Asthma   . Bradycardia    requiring previous d/c of BB and reduction of amiodarone  . CAD (coronary artery disease)    nonobstructive per notes  . Chronic diastolic CHF (congestive heart failure) ([place])   . CKD (chronic kidney disease), stage III   . Complication of blood transfusion    "got the wrong blood type at [facility] in ~ 2015; no adverse reaction that we are aware of"/daughter, [name] (01/27/2016)  . COPD (chronic obstructive pulmonary disease) ([place])   . Depression    "light case"  . DVT  (deep venous thrombosis) ([place]) 01/2016   a. LLE DVT 01/2016 - switched from Eliquis to Coumadin.  . Gastric stenosis    a. s/p stomach tube  . GERD (gastroesophageal reflux disease)   . History of blood transfusion    "several" (01/27/2016)  . History of stomach ulcers   . Hyperlipidemia   . Hypertension   . Hypothyroidism   . Paraesophageal hernia   . Perforated gastric ulcer ([place])   . Seasonal allergies   . SIADH (syndrome of inappropriate ADH production) ([place])    [name] 01/10/2015  . Small bowel obstruction    "I don't know how many" (01/11/2015)  . Stroke ([place])    "light one"  . Type II diabetes mellitus ([place])    "related to prednisone use  for > 20 yr; once predinose stopped; no more DM RX" (01/27/2016)  . Ventral hernia with bowel obstruction     Assessment: 83 y/o F on warfarin PTA for aflutter, just discharged on 2/14, back to ED with worsening shortness of breath. Pharmacy consulted to dose coumadin -INR= 1.93  Goal of Therapy:  INR 2-3 Monitor platelets by anticoagulation protocol: Yes   Plan:  -Warfarin 5 mg PO x 1 tonight at 1800 -Daily PT/INR  Hildred Laser, Pharm D 05/24/2016 11:34 AM

## 2016-05-24 NOTE — Progress Notes (Addendum)
Patient ID: Shannon [name], female   DOB: May 11, 1923, 83 y.o.   MRN: [id number]  PROGRESS NOTE    Reachel Guthridge  XLK:[id number] DOB: [birth year] DOA: 05/22/2016  PCP: Kirt Boys, DO   Brief Narrative:  83 year old female with past medical history significant for hypertension, dyslipidemia, CVA, COPD and asthma, chronic atrial fibrillation, diastolic congestive heart failure, diabetes, hypothyroidism, gastric outlet obstruction with prior G2. Patient presented with shortness of breath ever since she left the hospital 05/17/2016.[name] Last hospitalization from 05/08/2016 through 05/17/2016 for community-acquired pneumonia and respiratory failure.  Patient was given nebulizer treatments, BiPAP but continued to be short of breath for which reason she was admitted she was given vancomycin and aztreonam for possible healthcare associated pneumonia but her chest x-ray actually revealed resolving pneumonia. Urinalysis was negative for leukocytes.  Assessment & Plan:  Acute on chronic respiratory failure with hypoxia / Acute COPD exacerbation - Likely combination of pneumonia as well as COPD exacerbation, aspiration - Looks better this am, less respiratory distress  - Continue Brovana and Pulmicort nebulizer twice daily - Continue solumedrol but reduce frequency to once a day instead of Q 12 hours  - Continue DuoNeb nebulizer 3 times daily as well as every 2 hours as needed for shortness of breath or wheezing - Continue oxygen support via [place] to keep O2 sats above 90%  Aspiration pneumonitis - She is on tube feeds with minimal PO intake at home - Plan for SLP evaluation  - Continue cefepime and vancomycin for now  Sepsis / Healthcare associated pneumonia / Lactic acidosis  - Sepsis criteria met on the admission with tachypnea, hypoxia, lactic acidosis of 2.3 and 3 - Source of infection likely healthcare associated pneumonia considering patient's last recent hospitalization within 5 days - Continue  cefepime and vancomycin - Blood cx results negative so far  - Respiratory virus panel is negative  - Repeat lactic acid in am  Hypothyroidism - Continue synthroid   Chronic atrial fibrillation - CHADS vasc score 6 - Continue anticoagulation with coumadin - Rate controlled with bisoprolol and Cardizem  Chronic diastolic CHF - EF 60% based on 2 D ECHO 05/09/2016 - BNP in 200 on this admission - Continue current meds  Chronic kidney disease stage 3 - Cr on this admission 1.31, at baseline compared to recent creatinine values   Essential hypertension - Continue Cardizem and Bisoprolol - Continue Hydralazine and Imdur   Dyslipidemia - Continue statin therapy   DVT prophylaxis: On Coumadin  Code Status: DNR/DNI Family Communication: spoke with pt daughter 2/19 Disposition Plan: home once medically stable, needs SLP; anticipated D/C by 2/23   Consultants:   SLP  PT  Procedures:   BiPAP  Antimicrobials:   Vanco and cefepime 05/22/2016 -->   Subjective: No overnight events.   Objective: Vitals:   05/23/16 2300 05/24/16 0000 05/24/16 0300 05/24/16 0811  BP: (!) 152/62 (!) 152/62  (!) 181/82  Pulse: 82 77 77 83  Resp: (!) 21 18 17 18   Temp: 97.6 F (36.4 C)  97.8 F (36.6 C) 97.9 F (36.6 C)  TempSrc: Oral  Oral Axillary  SpO2: 95% 97% 98% 95%  Weight:      Height:        Intake/Output Summary (Last 24 hours) at 05/24/16 3664 Last data filed at 05/24/16 0820  Gross per 24 hour  Intake             1010 ml  Output  400 ml  Net              610 ml   Filed Weights   05/22/16 0058 05/22/16 0428 05/22/16 0455  Weight: 62.1 kg (137 lb) 63 kg (138 lb 14.2 oz) 63 kg (138 lb 14.2 oz)    Examination:  General exam: No acute distress  Respiratory system: Rhonchorous bilaterally   Cardiovascular system: S1 & S2 heard, Rate controlled  Gastrointestinal system: (+) BS, non tender, PEG tube in place  Central nervous system: Non focal, more alert  this am Extremities: No swelling, palpable pulses bilaterally  Skin: No rashes, lesions or ulcers, skin is warm and dry  Psychiatry: Normal mood and behavior   Data Reviewed: I have personally reviewed following labs and imaging studies  CBC:  Recent Labs Lab 05/22/16 0101 05/22/16 0117 05/24/16 0340  WBC 8.3  --  18.2*  NEUTROABS 4.9  --   --   HGB 11.4* 12.6 10.1*  HCT 33.1* 37.0 30.1*  MCV 83.0  --  81.8  PLT 418*  --  399   Basic Metabolic Panel:  Recent Labs Lab 05/22/16 0101 05/22/16 0117 05/24/16 0340  NA 131* 131* 130*  K 4.5 4.4 4.3  CL 93* 96* 96*  CO2 26  --  24  GLUCOSE 178* 171* 398*  BUN 47* 48* 54*  CREATININE 1.31* 1.30* 1.23*  CALCIUM 9.8  --  9.3   GFR: Estimated Creatinine Clearance: 28.5 mL/min (by C-G formula based on SCr of 1.23 mg/dL (H)). Liver Function Tests:  Recent Labs Lab 05/22/16 0101  AST 50*  ALT 68*  ALKPHOS 87  BILITOT 0.6  PROT 7.6  ALBUMIN 3.5   No results for input(s): LIPASE, AMYLASE in the last 168 hours. No results for input(s): AMMONIA in the last 168 hours. Coagulation Profile:  Recent Labs Lab 05/22/16 0101 05/22/16 0717 05/23/16 0634 05/24/16 0340  INR 1.84 1.90 1.91 1.93   Cardiac Enzymes: No results for input(s): CKTOTAL, CKMB, CKMBINDEX, TROPONINI in the last 168 hours. BNP (last 3 results)  Recent Labs  05/08/16 1053  PROBNP 1,132*   HbA1C: No results for input(s): HGBA1C in the last 72 hours. CBG:  Recent Labs Lab 05/17/16 1132  GLUCAP 160*   Lipid Profile: No results for input(s): CHOL, HDL, LDLCALC, TRIG, CHOLHDL, LDLDIRECT in the last 72 hours. Thyroid Function Tests: No results for input(s): TSH, T4TOTAL, FREET4, T3FREE, THYROIDAB in the last 72 hours. Anemia Panel: No results for input(s): VITAMINB12, FOLATE, FERRITIN, TIBC, IRON, RETICCTPCT in the last 72 hours. Urine analysis:    Component Value Date/Time   COLORURINE YELLOW 05/22/2016 0155   APPEARANCEUR HAZY (A)  05/22/2016 0155   LABSPEC 1.011 05/22/2016 0155   PHURINE 7.0 05/22/2016 0155   GLUCOSEU NEGATIVE 05/22/2016 0155   HGBUR NEGATIVE 05/22/2016 0155   BILIRUBINUR NEGATIVE 05/22/2016 0155   KETONESUR NEGATIVE 05/22/2016 0155   PROTEINUR 100 (A) 05/22/2016 0155   UROBILINOGEN 0.2 01/27/2015 2308   NITRITE NEGATIVE 05/22/2016 0155   LEUKOCYTESUR NEGATIVE 05/22/2016 0155   Sepsis Labs: @LABRCNTIP (procalcitonin:4,lacticidven:4)  Blood Culture (routine x 2)     Status: None (Preliminary result)   Collection Time: 05/22/16  1:01 AM  Result Value Ref Range Status   Specimen Description BLOOD RIGHT FOREARM  Final   Culture NO GROWTH 1 DAY  Final   Report Status PENDING  Incomplete  Urine culture     Status: None   Collection Time: 05/22/16  1:55 AM  Result Value Ref  Range Status   Specimen Description URINE, CLEAN CATCH  Final   Special Requests NONE  Final   Culture NO GROWTH  Final   Report Status 05/23/2016 FINAL  Final  MRSA PCR Screening     Status: Abnormal   Collection Time: 05/22/16  4:27 AM  Result Value Ref Range Status   MRSA by PCR POSITIVE (A) NEGATIVE Final  Blood Culture (routine x 2)     Status: None (Preliminary result)   Collection Time: 05/22/16  4:51 AM  Result Value Ref Range Status   Specimen Description BLOOD RIGHT HAND  Final   Culture NO GROWTH 1 DAY  Final   Report Status PENDING  Incomplete  Respiratory Panel by PCR     Status: None   Collection Time: 05/22/16 10:30 AM  Result Value Ref Range Status   Adenovirus NOT DETECTED NOT DETECTED Final   Coronavirus 229E NOT DETECTED NOT DETECTED Final   Coronavirus HKU1 NOT DETECTED NOT DETECTED Final   Coronavirus NL63 NOT DETECTED NOT DETECTED Final   Coronavirus OC43 NOT DETECTED NOT DETECTED Final   Metapneumovirus NOT DETECTED NOT DETECTED Final   Rhinovirus / Enterovirus NOT DETECTED NOT DETECTED Final   Influenza A NOT DETECTED NOT DETECTED Final   Influenza B NOT DETECTED NOT DETECTED Final    Parainfluenza Virus 1 NOT DETECTED NOT DETECTED Final   Parainfluenza Virus 2 NOT DETECTED NOT DETECTED Final   Parainfluenza Virus 3 NOT DETECTED NOT DETECTED Final   Parainfluenza Virus 4 NOT DETECTED NOT DETECTED Final   Respiratory Syncytial Virus NOT DETECTED NOT DETECTED Final   Bordetella pertussis NOT DETECTED NOT DETECTED Final   Chlamydophila pneumoniae NOT DETECTED NOT DETECTED Final   Mycoplasma pneumoniae NOT DETECTED NOT DETECTED Final      Radiology Studies: Dg Chest Port 1 View Result Date: 05/22/2016  Interval improvement of the left lung base opacity with near complete resolution. Small left pleural effusion with minimal left lung base atelectasis versus residual infiltrate. Clinical correlation and follow-up recommended.   Scheduled Meds: . arformoterol  15 mcg Nebulization BID  . atorvastatin  10 mg Per Tube q1800  . bisoprolol  5 mg Per Tube Daily  . budesonide   0.5 mg Nebulization BID  . ceFEPime  1 g Intravenous Q24H  . diltiazem  120 mg Oral Daily  . feeding supplement (NEPRO CARB STEADY)  237 mL Oral BID BM  . feeding supplement OSMOLITE   65 mL/hr Per Tube Q24H  . fluticasone  2 spray Each Nare Daily  . free water  230 mL Per Tube TID  . hydrALAZINE  50 mg Per Tube TID  . ipratropium-albuter  3 mL Nebulization TID  . isosorbide mononitrate  30 mg Oral Daily  . levothyroxine  137 mcg Oral QAC breakfast  . methylPREDNISolo  60 mg Intravenous Q12H  . multivitamin with minerals  1 tablet Oral Daily  . pantoprazole (PROTONIX) IV  40 mg Intravenous Q12H  . vancomycin  1,000 mg Intravenous Q24H  . warfarin  5 mg Oral ONCE-1800   Continuous Infusions:   LOS: 2 days    Time spent: 25 minutes  Greater than 50% of the time spent on counseling and coordinating the care.   Manson Passey, MD [facility] Pager [phone]  If 7PM-7AM, please contact night-coverage [email or web address] [email or web address] 05/24/2016, 8:33 AM

## 2016-05-24 NOTE — Evaluation (Signed)
Physical Therapy Evaluation Patient Details Name: Shannon [name] MRN: [id number] DOB: 05-05-1923 Today's Date: 05/24/2016   History of Present Illness  83 year old female with past medical history significant for hypertension, dyslipidemia, CVA, COPD and asthma, chronic atrial fibrillation, diastolic congestive heart failure, diabetes, hypothyroidism, gastric outlet obstruction with prior Gtube. Patient presented with shortness of breath ever since she left the hospital 05/17/2016. Last hospitalization from 05/08/2016 through 05/17/2016 for community-acquired pneumonia and respiratory failure  Clinical Impression  Patient demonstrates deficits in functional mobility as indicated below. Will need continued skilled PT to address deficits and maximize function. Will see as indicated and progress as tolerated.     Follow Up Recommendations Home health PT    Equipment Recommendations  None recommended by PT    Recommendations for Other Services       Precautions / Restrictions Precautions Precautions: Fall Restrictions Weight Bearing Restrictions: No      Mobility  Bed Mobility               General bed mobility comments: received in chair from OT  Transfers Overall transfer level: Needs assistance Equipment used: Rolling walker (2 wheeled) Transfers: Sit to/from Stand Sit to Stand: Min assist         General transfer comment: Min assist for stability   Ambulation/Gait Ambulation/Gait assistance: Min assist Ambulation Distance (Feet): 180 Feet Assistive device: Rolling walker (2 wheeled) Gait Pattern/deviations: Step-through pattern;Decreased stride length;Trunk flexed Gait velocity: decreased   General Gait Details: Min guard for safety. No LOB. Steady gait with RW.  Stairs            Wheelchair Mobility    Modified Rankin (Stroke Patients Only)       Balance Overall balance assessment: Needs assistance Sitting-balance support: Feet supported;No upper  extremity supported Sitting balance-Leahy Scale: Good     Standing balance support: During functional activity;Bilateral upper extremity supported Standing balance-Leahy Scale: Poor Standing balance comment: reliant on bilateral UE supports on RW                              Pertinent Vitals/Pain Pain Assessment: Faces Faces Pain Scale: Hurts little more Pain Location: left side buttocks Pain Descriptors / Indicators: Sore Pain Intervention(s): Monitored during session    Home Living Family/patient expects to be discharged to:: Private residence Living Arrangements: Children Available Help at Discharge: Family;Available 24 hours/day Type of Home: House Home Access: Ramped entrance     Home Layout: One level Home Equipment: Walker - 4 wheels;Cane - single point;Shower seat;Hand held Careers information officer - 2 wheels Additional Comments: Lives w/ daughter, son in Social worker, and grandson    Prior Function Level of Independence: Needs assistance   Gait / Transfers Assistance Needed: Uses rollator   ADL's / Homemaking Assistance Needed: family assists with bathing and other ADLs PRN  Comments: Uses rollator during the day and RW at night.       Hand Dominance   Dominant Hand: Right    Extremity/Trunk Assessment   Upper Extremity Assessment Upper Extremity Assessment: Generalized weakness    Lower Extremity Assessment Lower Extremity Assessment: Generalized weakness    Cervical / Trunk Assessment Cervical / Trunk Assessment: Kyphotic  Communication   Communication: No difficulties  Cognition Arousal/Alertness: Awake/alert Behavior During Therapy: WFL for tasks assessed/performed Overall Cognitive Status: Within Functional Limits for tasks assessed  General Comments      Exercises     Assessment/Plan    PT Assessment Patient needs continued PT services  PT Problem List Decreased strength;Decreased activity tolerance;Decreased  balance;Decreased mobility;Cardiopulmonary status limiting activity;Pain       PT Treatment Interventions DME instruction;Gait training;Functional mobility training;Therapeutic activities;Therapeutic exercise;Balance training;Patient/family education    PT Goals (Current goals can be found in the Care Plan section)  Acute Rehab PT Goals Patient Stated Goal: to get better PT Goal Formulation: With patient Time For Goal Achievement: 06/07/16 Potential to Achieve Goals: Good    Frequency Min 3X/week   Barriers to discharge        Co-evaluation               End of Session   Activity Tolerance: Patient tolerated treatment well Patient left: in chair;with chair alarm set;with call bell/phone within reach Nurse Communication: Mobility status           Time: 4098-1191 PT Time Calculation (min) (ACUTE ONLY): 13 min   Charges:   PT Evaluation $PT Eval Moderate Complexity: 1 Procedure     PT G Codes:         Fabio Asa 06-12-2016, 5:12 PM Charlotte Crumb, PT DPT  [phone]

## 2016-05-24 NOTE — Evaluation (Signed)
Occupational Therapy Evaluation Patient Details Name: Shannon [name] MRN: [id number] DOB: 1923/10/23 Today's Date: 05/24/2016    History of Present Illness 83 year old female with past medical history significant for hypertension, dyslipidemia, CVA, COPD and asthma, chronic atrial fibrillation, diastolic congestive heart failure, diabetes, hypothyroidism, gastric outlet obstruction with prior Gtube. Patient presented with shortness of breath ever since she left the hospital 05/17/2016. Last hospitalization from 05/08/2016 through 05/17/2016 for community-acquired pneumonia and respiratory failure   Clinical Impression   Pt is well known to this therapist from previous admission. Presents with decreased activity tolerance, impaired balance and dependence in ADL. Pt performed one activity in standing at sink, toileted and ambulated with one seated rest break. Pt is eager to mobilize. Will follow acutely.    Follow Up Recommendations  No OT follow up;Supervision/Assistance - 24 hour    Equipment Recommendations  None recommended by OT    Recommendations for Other Services       Precautions / Restrictions Precautions Precautions: Fall Restrictions Weight Bearing Restrictions: No      Mobility Bed Mobility               General bed mobility comments: received in chair  Transfers Overall transfer level: Needs assistance Equipment used: Rolling walker (2 wheeled) Transfers: Sit to/from Stand Sit to Stand: Min assist         General transfer comment: Min assist for stability     Balance Overall balance assessment: Needs assistance Sitting-balance support: Feet supported;No upper extremity supported Sitting balance-Leahy Scale: Good     Standing balance support: During functional activity;Bilateral upper extremity supported Standing balance-Leahy Scale: Poor Standing balance comment: reliant on bilateral UE supports on RW                             ADL  Overall ADL's : Needs assistance/impaired Eating/Feeding: Set up;Sitting Eating/Feeding Details (indicate cue type and reason): assist to thick liquids Grooming: Wash/dry hands;Standing;Minimal assistance   Upper Body Bathing: Minimal assistance;Sitting   Lower Body Bathing: Total assistance;Sit to/from stand   Upper Body Dressing : Minimal assistance;Sitting   Lower Body Dressing: Total assistance;Sit to/from stand   Toilet Transfer: Minimal assistance;BSC;Ambulation;RW   Toileting- Clothing Manipulation and Hygiene: Minimal assistance;Sit to/from stand       Functional mobility during ADLs: Min guard;Rolling walker General ADL Comments: Unable to read 02 sats due to pt's hands being cold.     Vision Patient Visual Report: No change from baseline       Perception     Praxis      Pertinent Vitals/Pain Pain Assessment: Faces Faces Pain Scale: Hurts little more Pain Location: left side buttocks Pain Descriptors / Indicators: Sore Pain Intervention(s): Monitored during session     Hand Dominance Right   Extremity/Trunk Assessment Upper Extremity Assessment Upper Extremity Assessment: Generalized weakness   Lower Extremity Assessment Lower Extremity Assessment: Generalized weakness   Cervical / Trunk Assessment Cervical / Trunk Assessment: Kyphotic   Communication Communication Communication: No difficulties   Cognition Arousal/Alertness: Awake/alert Behavior During Therapy: WFL for tasks assessed/performed Overall Cognitive Status: Within Functional Limits for tasks assessed                     General Comments       Exercises       Shoulder Instructions      Home Living Family/patient expects to be discharged to:: Private residence Living Arrangements: Children Available Help at  Discharge: Family;Available 24 hours/day Type of Home: House Home Access: Ramped entrance     Home Layout: One level     Bathroom Shower/Tub: Multimedia programmer: Standard Bathroom Accessibility: Yes   Home Equipment: Walker - 4 wheels;Cane - single point;Shower seat;Hand held Careers information officer - 2 wheels   Additional Comments: Lives w/ daughter, son in Social worker, and grandson      Prior Functioning/Environment Level of Independence: Needs assistance  Gait / Transfers Assistance Needed: Uses rollator  ADL's / Homemaking Assistance Needed: family assists with bathing and other ADLs PRN   Comments: Uses rollator during the day and RW at night.          OT Problem List: Decreased strength;Decreased activity tolerance;Impaired balance (sitting and/or standing);Decreased safety awareness;Decreased knowledge of use of DME or AE;Cardiopulmonary status limiting activity;Pain      OT Treatment/Interventions: Self-care/ADL training;DME and/or AE instruction;Patient/family education;Balance training;Therapeutic activities    OT Goals(Current goals can be found in the care plan section) Acute Rehab OT Goals Patient Stated Goal: to get better OT Goal Formulation: With patient Time For Goal Achievement: 06/07/16 Potential to Achieve Goals: Fair ADL Goals Pt Will Perform Grooming: with supervision;standing (2 activities) Pt Will Perform Upper Body Dressing: with supervision;with set-up;sitting Pt Will Transfer to Toilet: with supervision;ambulating Pt Will Perform Toileting - Clothing Manipulation and hygiene: with supervision;sit to/from stand Additional ADL Goal #1: Pt will gather items ADL items with RW with supervision.  OT Frequency: Min 2X/week   Barriers to D/C:            Co-evaluation              End of Session Equipment Utilized During Treatment: Gait belt;Rolling walker;Oxygen Nurse Communication: Mobility status  Activity Tolerance: Patient tolerated treatment well Patient left: in chair;with call bell/phone within reach  OT Visit Diagnosis: Unsteadiness on feet (R26.81);Other abnormalities of gait and  mobility (R26.89);Muscle weakness (generalized) (M62.81);Pain Pain - Right/Left: Right Pain - part of body:  (buttocks)                ADL either performed or assessed with clinical judgement  Time: 1528-1550 OT Time Calculation (min): 22 min Charges:  OT General Charges $OT Visit: 1 Procedure OT Evaluation $OT Eval Moderate Complexity: 1 Procedure G-Codes:     Evern Bio 05/24/2016, 4:26 PM  [phone]

## 2016-05-24 NOTE — Progress Notes (Signed)
Late entry  05/23/16 1000  SLP Visit Information  SLP Received On 05/23/16  General Information  HPI 83 year old female with past medical history significant for hypertension, dyslipidemia, CVA, COPD and asthma, chronic atrial fibrillation, diastolic congestive heart failure, diabetes, hypothyroidism, gastric outlet obstruction with prior Gtube. Patient presented with shortness of breath ever since she left the hospital 05/17/2016. Last hospitalization from 05/08/2016 through 05/17/2016 for community-acquired pneumonia and respiratory failure. Pt currently has a J tube though she has previously taken nectar thick, clear liquids, soups, glucerna orally as well. Her daughter reported that When pt had a GJ tube she was able to drain residuals, but cannot do that since tube is now only a J tube and pt often complains of fullness. Pt has a history of dysphagia, last MBS on 05/15/16 recommended thin liquids only if pt can do a chin tuck, otherwise nectar thick liquids due ot silent aspiration of thin.   Type of Study Bedside Swallow Evaluation  Previous Swallow Assessment see HPI  Diet Prior to this Study NPO;PEG tube  Temperature Spikes Noted No  Respiratory Status Nasal cannula  History of Recent Intubation No  Behavior/Cognition Alert;Cooperative;Pleasant mood  Oral Cavity Assessment WFL  Oral Care Completed by SLP No  Oral Cavity - Dentition Edentulous  Vision Functional for self-feeding  Self-Feeding Abilities Able to feed self  Patient Positioning Upright in chair  Baseline Vocal Quality Breathy  Volitional Cough Congested;Strong  Volitional Swallow Able to elicit  Oral Motor/Sensory Function  Overall Oral Motor/Sensory Function WFL  Thin Liquid  Thin Liquid Impaired  Presentation Self Fed;Straw  Pharyngeal  Phase Impairments Cough - Delayed  Nectar Thick Liquid  Nectar Thick Liquid WFL  Honey Thick Liquid  Honey Thick Liquid NT  Puree  Puree NT  Solid  Solid NT  SLP Assessment   Clinical Impression Statement (ACUTE ONLY) Pt demonstrates presentation consistent with baseline chronic dysphagia. Pt is able to verbalize recommendations from prior MBS including need for nectar thick liquids but aiblity to drink thin water if she completes a chin tuck. Pt demosntrated a chin tuck accross consistencies. Recommend pt resume nectar thick liquid diet though thin water is allowed. Pt will be at persistent risk of aspiration with all PO intake given history of esophageal deficits. Will f/u x1 for any further questions or needs from family/pt. Pt has questions regarding appropriate supplements (breeze, nepro?), asked her to discuss with dietitian.   SLP Visit Diagnosis Dysphagia, oropharyngeal phase (R13.12)  Impact on safety and function Moderate aspiration risk  Other Related Risk Factors History of dysphagia;Decreased respiratory status;Previous CVA;History of esophageal-related issues  Swallow Evaluation Recommendations  SLP Diet Recommendations Nectar-thick liquid (ok to have thin water)  Liquid Administration via Cup  Medication Administration Via alternative means  Supervision Patient able to self feed  Compensations Slow rate;Small sips/bites  Postural Changes Seated upright at 90 degrees  Treatment Plan  Oral Care Recommendations Oral care QID  Other Recommendations Order thickener from pharmacy  Treatment Recommendations Therapy as outlined in treatment plan below  Follow up Recommendations 24 hour supervision/assistance  Speech Therapy Frequency (ACUTE ONLY) min 2x/week  Treatment Duration 1 week  Interventions Aspiration precaution training;Patient/family education;Trials of upgraded texture/liquids;Diet toleration management by SLP;Compensatory techniques  Prognosis  Prognosis for Safe Diet Advancement Good  Individuals Consulted  Consulted and Agree with Results and Recommendations Patient  Progression Toward Goals  Progression toward goals Progressing toward  goals  SLP Time Calculation  SLP Start Time (ACUTE ONLY) 1020  SLP Stop  Time (ACUTE ONLY) 1040  SLP Time Calculation (min) (ACUTE ONLY) 20 min  SLP Evaluations  $ SLP Speech Visit 1 Procedure  SLP Evaluations  $BSS Swallow 1 Procedure  $Swallowing Treatment 1 Procedure

## 2016-05-25 LAB — GLUCOSE, CAPILLARY
Glucose-Capillary: 552 mg/dL (ref 65–99)
Glucose-Capillary: 451 mg/dL — ABNORMAL HIGH (ref 65–99)
Glucose-Capillary: 215 mg/dL — ABNORMAL HIGH (ref 65–99)
Glucose-Capillary: 176 mg/dL — ABNORMAL HIGH (ref 65–99)
Glucose-Capillary: 203 mg/dL — ABNORMAL HIGH (ref 65–99)

## 2016-05-25 LAB — CBC
HCT: 32.1 % — ABNORMAL LOW (ref 36.0–46.0)
Hemoglobin: 10.8 g/dL — ABNORMAL LOW (ref 12.0–15.0)
MCH: 27.9 pg (ref 26.0–34.0)
MCHC: 33.6 g/dL (ref 30.0–36.0)
MCV: 82.9 fL (ref 78.0–100.0)
Platelets: 391 10*3/uL (ref 150–400)
RBC: 3.87 MIL/uL (ref 3.87–5.11)
RDW: 19.2 % — ABNORMAL HIGH (ref 11.5–15.5)
WBC: 15.5 10*3/uL — ABNORMAL HIGH (ref 4.0–10.5)

## 2016-05-25 LAB — BASIC METABOLIC PANEL
Anion gap: 12 (ref 5–15)
BUN: 52 mg/dL — ABNORMAL HIGH (ref 6–20)
CO2: 26 mmol/L (ref 22–32)
Calcium: 9.3 mg/dL (ref 8.9–10.3)
Chloride: 96 mmol/L — ABNORMAL LOW (ref 101–111)
Creatinine, Ser: 1.16 mg/dL — ABNORMAL HIGH (ref 0.44–1.00)
GFR calc Af Amer: 47 mL/min — ABNORMAL LOW (ref >60)
GFR calc non Af Amer: 40 mL/min — ABNORMAL LOW (ref >60)
Glucose, Bld: 327 mg/dL — ABNORMAL HIGH (ref 65–99)
Potassium: 4.4 mmol/L (ref 3.5–5.1)
Sodium: 134 mmol/L — ABNORMAL LOW (ref 135–145)

## 2016-05-25 LAB — PROTIME-INR
INR: 2.22
Prothrombin Time: 25 seconds — ABNORMAL HIGH (ref 11.4–15.2)

## 2016-05-25 LAB — LACTIC ACID, PLASMA: Lactic Acid, Venous: 2.1 mmol/L (ref 0.5–1.9)

## 2016-05-25 IMAGING — CT CT HEAD W/O CM
2 series · 15 of 30 positions shown, 19 images · non-contrast
Comparison: None.

CLINICAL DATA: Slurred speech

EXAM:
CT HEAD WITHOUT CONTRAST
TECHNIQUE: Contiguous axial images were obtained from the base of the skull
through the vertex without intravenous contrast.

[Series 201: head w/o, idose (1) · axial · non-contrast · 0.39mm/px · z∈[+53,+173]mm · 13 of 29 slices shown, 17 images]
[im 3/29  brain]
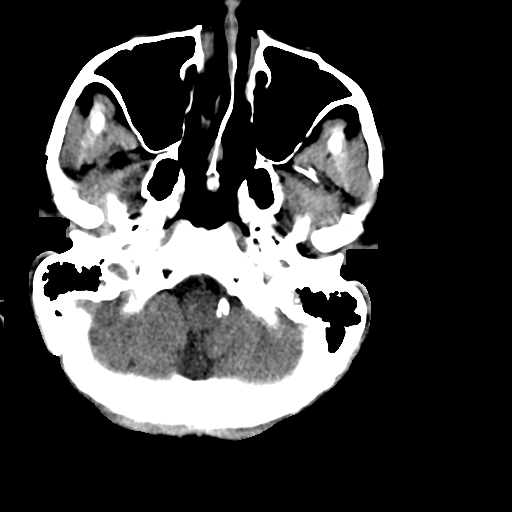
[im 3/29  bone]
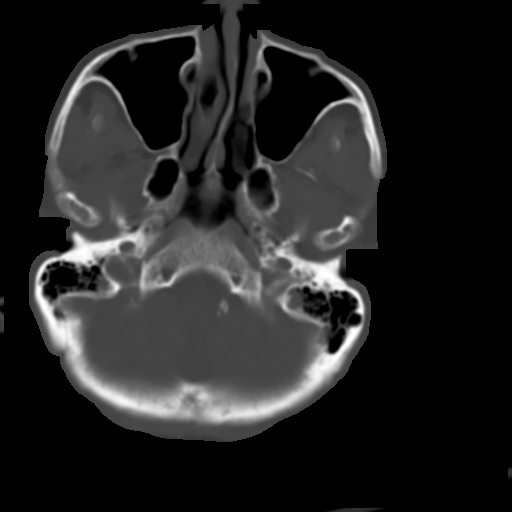
[im 5/29  brain]
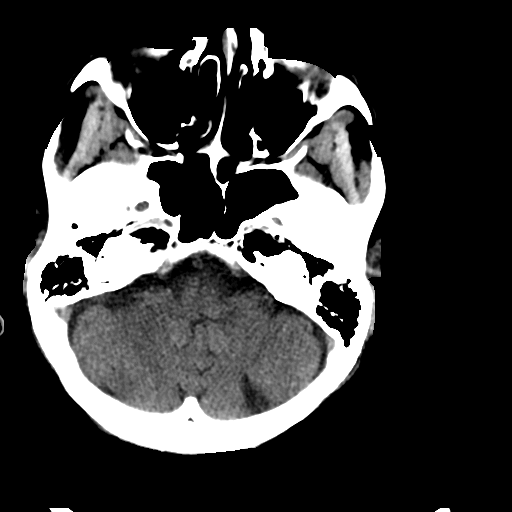
[im 7/29  brain]
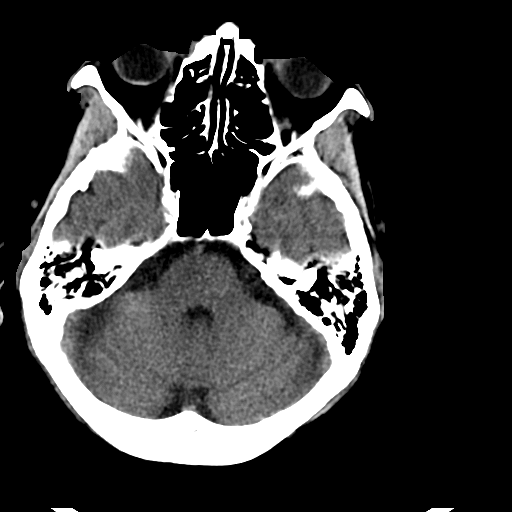
[im 9/29  brain]
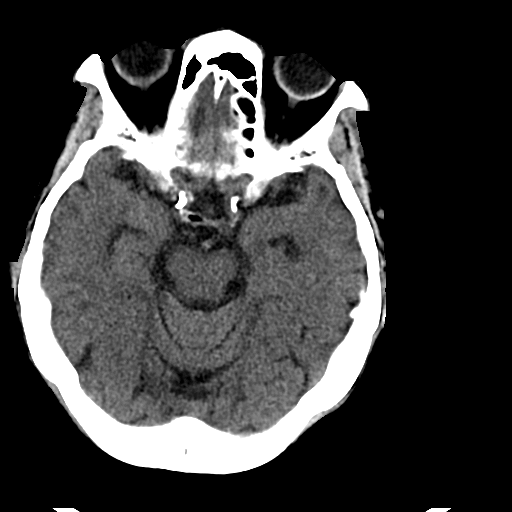
[im 11/29  brain]
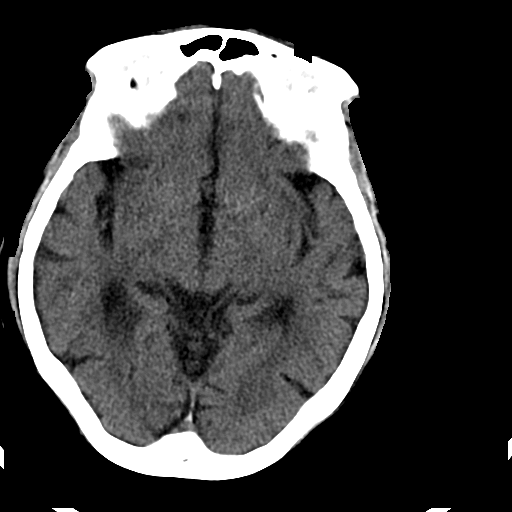
[im 11/29  bone]
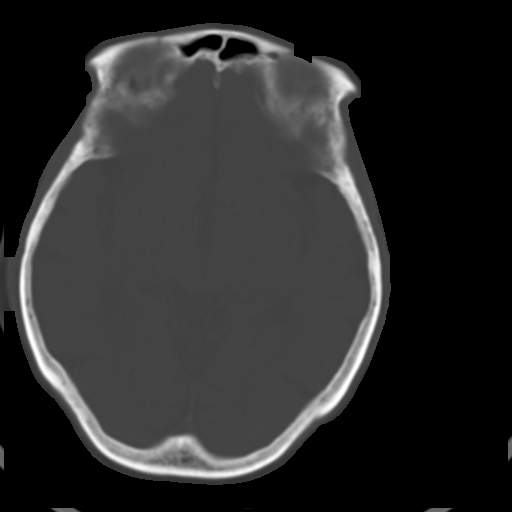
[im 13/29  brain]
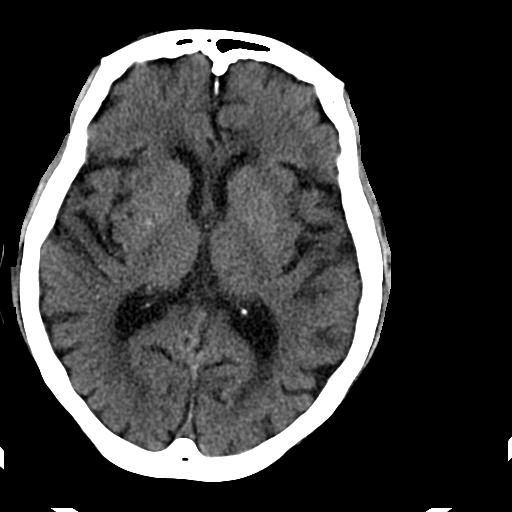
[im 15/29  brain]
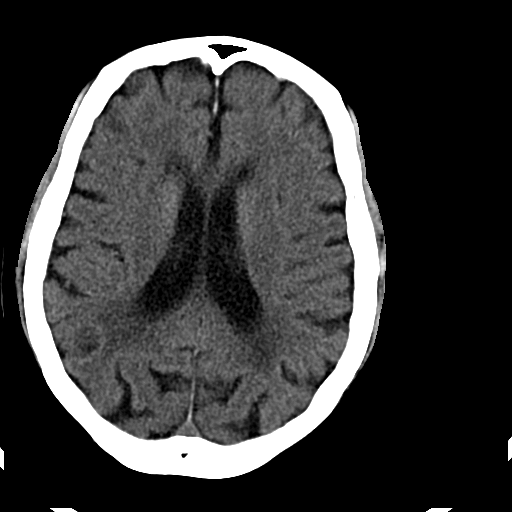
[im 17/29  brain]
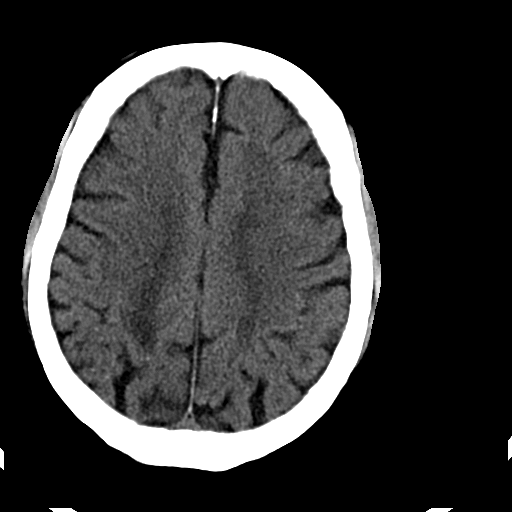
[im 19/29  brain]
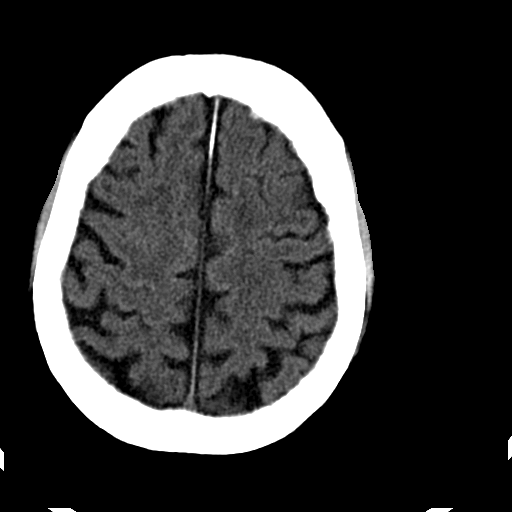
[im 19/29  bone]
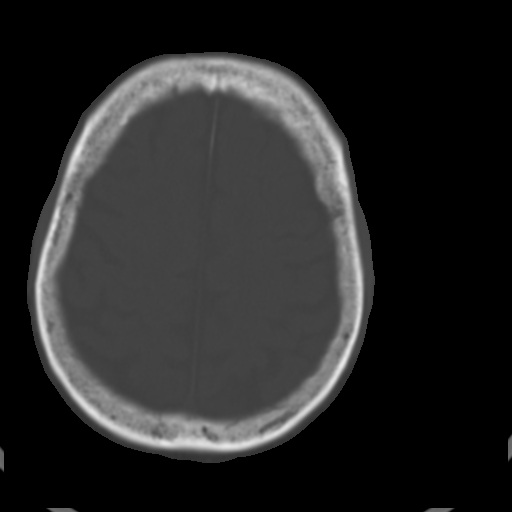
[im 21/29  brain]
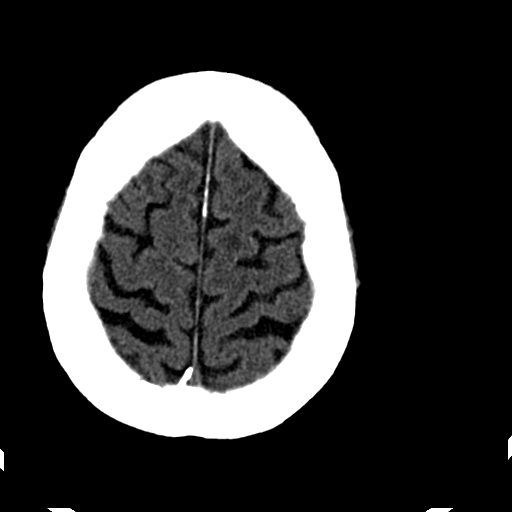
[im 23/29  brain]
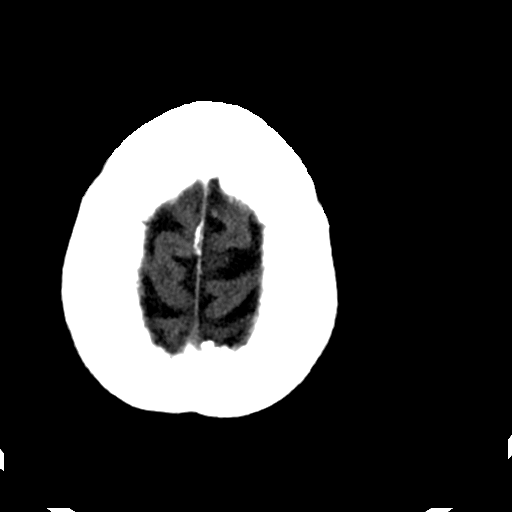
[im 25/29  brain]
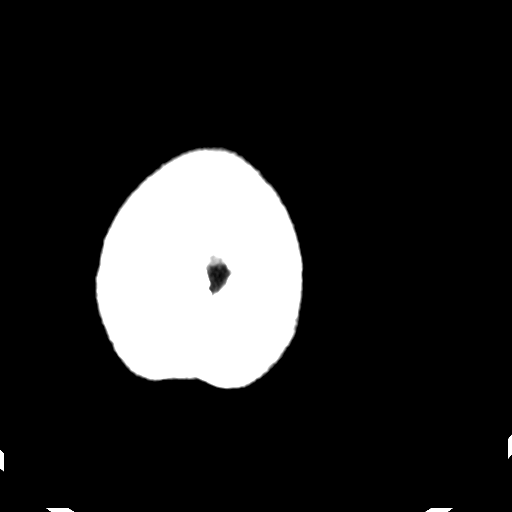
[im 27/29  brain]
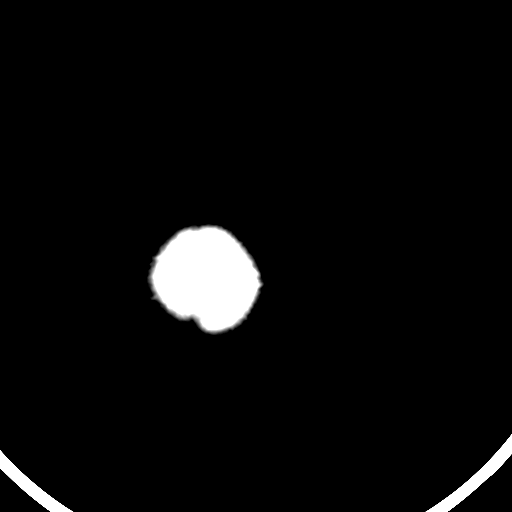
[im 27/29  bone]
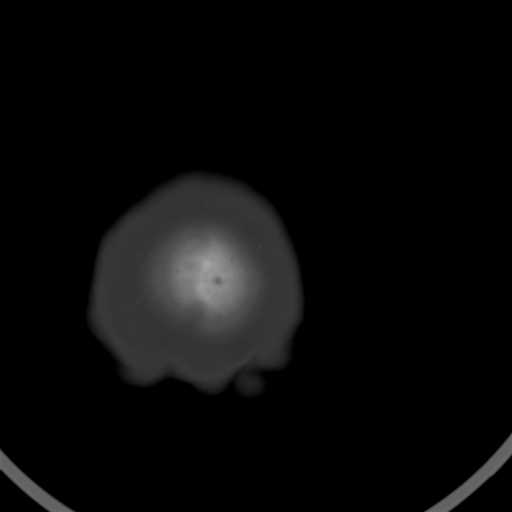

[Series 202: head w/o bone, idose (1) · axial · non-contrast · 0.39mm/px · z∈[+53,+73]mm · 2 of 29 slices shown]
[im 3/29  bone]
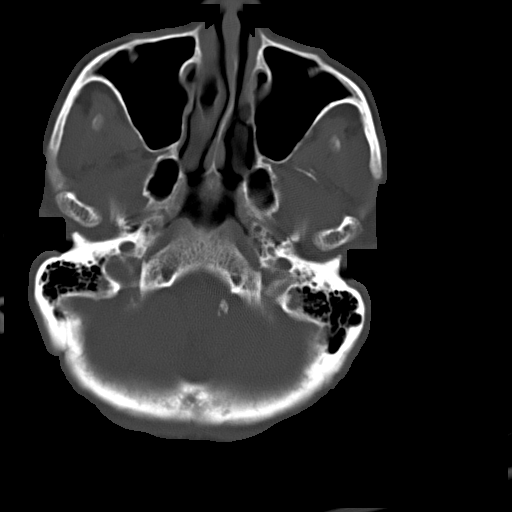
[im 7/29  bone]
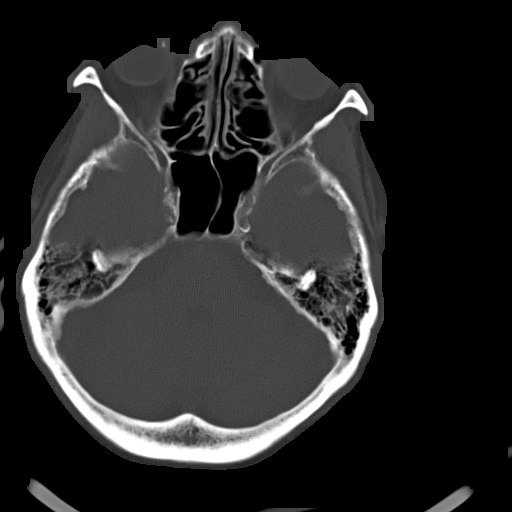

[15 of 30 positions shown; findings below may reference images not displayed]

FINDINGS: There is no evidence of mass effect, midline shift, or extra-axial
fluid collections. There is no evidence of a space-occupying lesion
or intracranial hemorrhage. There is no evidence of a cortical-based
area of acute infarction. There is generalized cerebral atrophy.
There is periventricular white matter low attenuation likely
secondary to microangiopathy.

The ventricles and sulci are appropriate for the patient's age. The
basal cisterns are patent.

Visualized portions of the orbits are unremarkable. The visualized
portions of the paranasal sinuses and mastoid air cells are
unremarkable. Cerebrovascular atherosclerotic calcifications are
noted.

The osseous structures are unremarkable.
IMPRESSION: No acute intracranial pathology.

These results were called by telephone at the time of interpretation
on 12/23/2013 at [DATE] to Dr. PIENI ANUNTI , who verbally acknowledged
these results.

## 2016-05-25 MED ORDER — BISOPROLOL FUMARATE 5 MG PO TABS
5.0000 mg | ORAL_TABLET | Freq: Every day | ORAL | Status: DC
  Administered 2016-05-26 – 2016-05-27 (×2): 5 mg via ORAL
  Filled 2016-05-25 (×3): qty 1

## 2016-05-25 MED ORDER — HYDRALAZINE HCL 50 MG PO TABS
50.0000 mg | ORAL_TABLET | Freq: Three times a day (TID) | ORAL | Status: DC
  Administered 2016-05-25 – 2016-05-30 (×15): 50 mg via ORAL
  Filled 2016-05-25 (×15): qty 1

## 2016-05-25 MED ORDER — WARFARIN SODIUM 7.5 MG PO TABS
3.7500 mg | ORAL_TABLET | ORAL | Status: DC
  Administered 2016-05-25 – 2016-05-28 (×4): 3.75 mg via ORAL
  Filled 2016-05-25 (×4): qty 0.5

## 2016-05-25 MED ORDER — METHYLPREDNISOLONE SODIUM SUCC 125 MG IJ SOLR
60.0000 mg | Freq: Once | INTRAMUSCULAR | Status: AC
  Administered 2016-05-25: 60 mg via INTRAVENOUS
  Filled 2016-05-25: qty 2

## 2016-05-25 MED ORDER — PANTOPRAZOLE SODIUM 40 MG PO TBEC
40.0000 mg | DELAYED_RELEASE_TABLET | Freq: Two times a day (BID) | ORAL | Status: DC
  Administered 2016-05-25 – 2016-05-30 (×10): 40 mg via ORAL
  Filled 2016-05-25 (×10): qty 1

## 2016-05-25 MED ORDER — ATORVASTATIN CALCIUM 10 MG PO TABS
10.0000 mg | ORAL_TABLET | Freq: Every day | ORAL | Status: DC
  Administered 2016-05-25 – 2016-05-29 (×5): 10 mg via ORAL
  Filled 2016-05-25 (×5): qty 1

## 2016-05-25 MED ORDER — INSULIN ASPART 100 UNIT/ML ~~LOC~~ SOLN
15.0000 [IU] | Freq: Once | SUBCUTANEOUS | Status: AC
  Administered 2016-05-25: 15 [IU] via SUBCUTANEOUS

## 2016-05-25 MED ORDER — PREDNISONE 50 MG PO TABS
50.0000 mg | ORAL_TABLET | Freq: Every day | ORAL | Status: DC
  Administered 2016-05-26 – 2016-05-27 (×2): 50 mg via ORAL
  Filled 2016-05-25 (×2): qty 1

## 2016-05-25 MED ORDER — SODIUM CHLORIDE 0.9 % IV BOLUS (SEPSIS)
250.0000 mL | Freq: Once | INTRAVENOUS | Status: AC
  Administered 2016-05-25: 250 mL via INTRAVENOUS

## 2016-05-25 MED ORDER — WARFARIN SODIUM 2.5 MG PO TABS: 2.5000 mg | ORAL_TABLET | ORAL | Status: DC

## 2016-05-25 NOTE — Progress Notes (Signed)
Alerted donna w [facility] of order for hhrn-hhpt-hhot-bath aid. Poss dc soon.

## 2016-05-25 NOTE — Progress Notes (Addendum)
Patient ID: Shannon [name], female   DOB: 10-07-1923, 83 y.o.   MRN: [id number]  PROGRESS NOTE    Shannon [name]  HQI:[id number] DOB: [birth year] DOA: 05/22/2016  PCP: Shannon Boys, DO   Brief Narrative:  83 year old female with past medical history significant for hypertension, dyslipidemia, CVA, COPD and asthma, chronic atrial fibrillation, diastolic congestive heart failure, diabetes, hypothyroidism, gastric outlet obstruction with prior G2. Patient presented with shortness of breath ever since she left the hospital 05/17/2016.[name] Last hospitalization from 05/08/2016 through 05/17/2016 for community-acquired pneumonia and respiratory failure.  Patient was given nebulizer treatments, BiPAP but continued to be short of breath for which reason she was admitted she was given vancomycin and aztreonam for possible healthcare associated pneumonia but her chest x-ray actually revealed resolving pneumonia. Urinalysis was negative for leukocytes.  Assessment & Plan:  Acute on chronic respiratory failure with hypoxia / Acute COPD exacerbation - Likely combination of pneumonia as well as COPD exacerbation, aspiration - Stable respiratory status this am - Continue Brovana and Pulmicort nebulizer twice daily - Stop solumedrol and start prednisone 50 mg a day from tomorrow - Continue DuoNeb nebulizer 3 times daily as well as every 2 hours as needed for shortness of breath or wheezing - Continue oxygen support via [place] to keep O2 sats above 90%  Aspiration pneumonitis - She is on tube feeds with minimal PO intake at home - Per SLP eval - nectar thick liquids  - Continue cefepime and vanco stopped 2/21 - Blood cx negative so far   Sepsis / Healthcare associated pneumonia / Lactic acidosis  - Sepsis criteria met on the admission with tachypnea, hypoxia, lactic acidosis of 2.3 and 3 - Source of infection likely healthcare associated pneumonia considering patient's last recent hospitalization within 5 days -  Continue cefepime and vanco stopped 2/21 - Blood cx results negative so far  - Respiratory virus panel is negative  - Repeat lactic acid in am; lactic acid this am 2.1  Steroid induced hyperglycemia - Continue SSI  Hypothyroidism - Continue synthroid   Persistent atrial fibrillation - CHADS vasc score 6 - Continue anticoagulation with coumadin - Rate controlled with bisoprolol and Cardizem  Chronic diastolic CHF - EF 60% based on 2 D ECHO 05/09/2016 - BNP in 200 on this admission - Continue current meds  Chronic kidney disease stage 3 - Cr on this admission 1.31, at baseline compared to recent creatinine values   Essential hypertension - Continue Cardizem and Bisoprolol - Continue Hydralazine and Imdur   Dyslipidemia - Continue statin therapy   DVT prophylaxis: On Coumadin  Code Status: DNR/DNI Family Communication: spoke with pt daughter 2/19, 2/21 and this am; spoke with her that mrs mcgary will be transferred to tele today and hopefully stable for d/c by 2/24 Disposition Plan: transfer to telemetry floor today; anticipate d/c 2/24   Consultants:   SLP  PT  Procedures:   BiPAP  Antimicrobials:   Vanco 05/22/2016 --> 05/24/2016   Cefepime 05/22/2016 -->   Subjective: No overnight events.   Objective: Vitals:   05/25/16 0329 05/25/16 0400 05/25/16 0833 05/25/16 1101  BP:  (!) 159/62 (!) 191/75 (!) 191/75  Pulse: 66 68 71 69  Resp: 20 (!) 28 (!) 24 19  Temp: 98.5 F (36.9 C)  98.3 F (36.8 C)   TempSrc: Oral     SpO2: 99% 100% 93% 97%  Weight:      Height:        Intake/Output Summary (Last 24 hours) at 05/25/16  1120 Last data filed at 05/25/16 0800  Gross per 24 hour  Intake          1295.83 ml  Output             1200 ml  Net            95.83 ml   Filed Weights   05/22/16 0058 05/22/16 0428 05/22/16 0455  Weight: 62.1 kg (137 lb) 63 kg (138 lb 14.2 oz) 63 kg (138 lb 14.2 oz)    Examination:  General exam: No acute distress, calm and  comfortable  Respiratory system: Rhonchorous but less wheezing this am Cardiovascular system: S1 & S2 heard, Rate controlled  Gastrointestinal system: (+) BS, non tender, PEG tube in place  Central nervous system: No focal deficits  Extremities: No swelling, palpable pulses Skin: skin is warm and dry  Psychiatry: Normal mood and behavior, no restlessness   Data Reviewed: I have personally reviewed following labs and imaging studies  CBC:  Recent Labs Lab 05/22/16 0101 05/22/16 0117 05/24/16 0340 05/25/16 0335  WBC 8.3  --  18.2* 15.5*  NEUTROABS 4.9  --   --   --   HGB 11.4* 12.6 10.1* 10.8*  HCT 33.1* 37.0 30.1* 32.1*  MCV 83.0  --  81.8 82.9  PLT 418*  --  399 391   Basic Metabolic Panel:  Recent Labs Lab 05/22/16 0101 05/22/16 0117 05/24/16 0340 05/25/16 0335  NA 131* 131* 130* 134*  K 4.5 4.4 4.3 4.4  CL 93* 96* 96* 96*  CO2 26  --  24 26  GLUCOSE 178* 171* 398* 327*  BUN 47* 48* 54* 52*  CREATININE 1.31* 1.30* 1.23* 1.16*  CALCIUM 9.8  --  9.3 9.3   GFR: Estimated Creatinine Clearance: 30.2 mL/min (by C-G formula based on SCr of 1.16 mg/dL (H)). Liver Function Tests:  Recent Labs Lab 05/22/16 0101  AST 50*  ALT 68*  ALKPHOS 87  BILITOT 0.6  PROT 7.6  ALBUMIN 3.5   No results for input(s): LIPASE, AMYLASE in the last 168 hours. No results for input(s): AMMONIA in the last 168 hours. Coagulation Profile:  Recent Labs Lab 05/22/16 0101 05/22/16 0717 05/23/16 0634 05/24/16 0340 05/25/16 0335  INR 1.84 1.90 1.91 1.93 2.22   Cardiac Enzymes: No results for input(s): CKTOTAL, CKMB, CKMBINDEX, TROPONINI in the last 168 hours. BNP (last 3 results)  Recent Labs  05/08/16 1053  PROBNP 1,132*   HbA1C: No results for input(s): HGBA1C in the last 72 hours. CBG:  Recent Labs Lab 05/24/16 1617 05/24/16 2205 05/25/16 0829 05/25/16 0834  GLUCAP 276* 158* 552* 451*   Lipid Profile: No results for input(s): CHOL, HDL, LDLCALC, TRIG,  CHOLHDL, LDLDIRECT in the last 72 hours. Thyroid Function Tests: No results for input(s): TSH, T4TOTAL, FREET4, T3FREE, THYROIDAB in the last 72 hours. Anemia Panel: No results for input(s): VITAMINB12, FOLATE, FERRITIN, TIBC, IRON, RETICCTPCT in the last 72 hours. Urine analysis:    Component Value Date/Time   COLORURINE YELLOW 05/22/2016 0155   APPEARANCEUR HAZY (A) 05/22/2016 0155   LABSPEC 1.011 05/22/2016 0155   PHURINE 7.0 05/22/2016 0155   GLUCOSEU NEGATIVE 05/22/2016 0155   HGBUR NEGATIVE 05/22/2016 0155   BILIRUBINUR NEGATIVE 05/22/2016 0155   KETONESUR NEGATIVE 05/22/2016 0155   PROTEINUR 100 (A) 05/22/2016 0155   UROBILINOGEN 0.2 01/27/2015 2308   NITRITE NEGATIVE 05/22/2016 0155   LEUKOCYTESUR NEGATIVE 05/22/2016 0155   Sepsis Labs: @LABRCNTIP (procalcitonin:4,lacticidven:4)  Blood Culture (routine x 2)  Status: None (Preliminary result)   Collection Time: 05/22/16  1:01 AM  Result Value Ref Range Status   Specimen Description BLOOD RIGHT FOREARM  Final   Culture NO GROWTH 1 DAY  Final   Report Status PENDING  Incomplete  Urine culture     Status: None   Collection Time: 05/22/16  1:55 AM  Result Value Ref Range Status   Specimen Description URINE, CLEAN CATCH  Final   Special Requests NONE  Final   Culture NO GROWTH  Final   Report Status 05/23/2016 FINAL  Final  MRSA PCR Screening     Status: Abnormal   Collection Time: 05/22/16  4:27 AM  Result Value Ref Range Status   MRSA by PCR POSITIVE (A) NEGATIVE Final  Blood Culture (routine x 2)     Status: None (Preliminary result)   Collection Time: 05/22/16  4:51 AM  Result Value Ref Range Status   Specimen Description BLOOD RIGHT HAND  Final   Culture NO GROWTH 1 DAY  Final   Report Status PENDING  Incomplete  Respiratory Panel by PCR     Status: None   Collection Time: 05/22/16 10:30 AM  Result Value Ref Range Status   Adenovirus NOT DETECTED NOT DETECTED Final   Coronavirus 229E NOT DETECTED NOT  DETECTED Final   Coronavirus HKU1 NOT DETECTED NOT DETECTED Final   Coronavirus NL63 NOT DETECTED NOT DETECTED Final   Coronavirus OC43 NOT DETECTED NOT DETECTED Final   Metapneumovirus NOT DETECTED NOT DETECTED Final   Rhinovirus / Enterovirus NOT DETECTED NOT DETECTED Final   Influenza A NOT DETECTED NOT DETECTED Final   Influenza B NOT DETECTED NOT DETECTED Final   Parainfluenza Virus 1 NOT DETECTED NOT DETECTED Final   Parainfluenza Virus 2 NOT DETECTED NOT DETECTED Final   Parainfluenza Virus 3 NOT DETECTED NOT DETECTED Final   Parainfluenza Virus 4 NOT DETECTED NOT DETECTED Final   Respiratory Syncytial Virus NOT DETECTED NOT DETECTED Final   Bordetella pertussis NOT DETECTED NOT DETECTED Final   Chlamydophila pneumoniae NOT DETECTED NOT DETECTED Final   Mycoplasma pneumoniae NOT DETECTED NOT DETECTED Final      Radiology Studies: Dg Chest Port 1 View Result Date: 05/22/2016  Interval improvement of the left lung base opacity with near complete resolution. Small left pleural effusion with minimal left lung base atelectasis versus residual infiltrate. Clinical correlation and follow-up recommended.   Scheduled Meds: . arformoterol  15 mcg Nebulization BID  . atorvastatin  10 mg Per Tube q1800  . bisoprolol  5 mg Per Tube Daily  . budesonide   0.5 mg Nebulization BID  . ceFEPime  1 g Intravenous Q24H  . diltiazem  120 mg Oral Daily  . feeding supplement (NEPRO CARB STEADY)  237 mL Oral BID BM  . feeding supplement OSMOLITE   65 mL/hr Per Tube Q24H  . fluticasone  2 spray Each Nare Daily  . free water  230 mL Per Tube TID  . hydrALAZINE  50 mg Per Tube TID  . ipratropium-albuter  3 mL Nebulization TID  . isosorbide mononitrate  30 mg Oral Daily  . levothyroxine  137 mcg Oral QAC breakfast  . methylPREDNISolo  60 mg Intravenous Q12H  . multivitamin with minerals  1 tablet Oral Daily  . pantoprazole (PROTONIX) IV  40 mg Intravenous Q12H  . vancomycin  1,000 mg Intravenous  Q24H  . warfarin  5 mg Oral ONCE-1800   Continuous Infusions:   LOS: 3 days  Time spent: 25 minutes  Greater than 50% of the time spent on counseling and coordinating the care.   Manson Passey, MD [facility] Pager [phone]  If 7PM-7AM, please contact night-coverage [email or web address] Password [facility] 05/25/2016, 11:20 AM

## 2016-05-25 NOTE — Progress Notes (Signed)
Pt arrived to floor via be from [place].  Oriented to room. Call bell at reach.  Instructed to call for assistance.  Also pt requesting to notify daughter vivian Tasia Catchings that she is now in [place].  Daughter called and notified of room and instructed will see pt tomorrow.  Pt made aware and verbalized understanding.  Kelcey Wickstrom,RN.

## 2016-05-25 NOTE — Progress Notes (Signed)
Took over care of this pt from [facility] at approx. 0015. Per Caitlin tube feeding had not been started at scheduled time d/t not being available and that bottle had just arrived from pharmacy. TF now infusing.

## 2016-05-25 NOTE — Progress Notes (Signed)
CRITICAL VALUE ALERT  Critical value received:  Lactic acid 2.1   Date of notification:  05/25/2016  Time of notification:  0500  Critical value read back:Yes.    Nurse who received alert:  Cipriano Mile   MD notified (1st page): Schorr NP   Time of first page:  [phone]  MD notified (2nd page):  Time of second page:  Responding MD:    Time MD responded:

## 2016-05-25 NOTE — Progress Notes (Signed)
[place] for Warfarin  Indication: Atrial flutter  Allergies  Allergen Reactions  . Penicillins Itching, Rash and Other (See Comments)    Has patient had a PCN reaction causing immediate rash, facial/tongue/throat swelling, SOB or lightheadedness with hypotension: Yes Has patient had a PCN reaction causing severe rash involving mucus membranes or skin necrosis: Unk Has patient had a PCN reaction that required hospitalization: Unk Has patient had a PCN reaction occurring within the last 10 years: Unk If all of the above answers are "NO", then may proceed with Cephalosporin use.    Patient Measurements: Height: 5\' 6"  (167.6 cm) Weight: 138 lb 14.2 oz (63 kg) IBW/kg (Calculated) : 59.3  Vital Signs: Temp: 97.6 F (36.4 C) (02/22 1208) Temp Source: Oral (02/22 1208) BP: 166/68 (02/22 1208) Pulse Rate: 73 (02/22 1208)  Labs:  Recent Labs  05/23/16 0634 05/24/16 0340 05/25/16 0335  HGB  --  10.1* 10.8*  HCT  --  30.1* 32.1*  PLT  --  399 391  LABPROT 22.1* 22.3* 25.0*  INR 1.91 1.93 2.22  CREATININE  --  1.23* 1.16*    Estimated Creatinine Clearance: 30.2 mL/min (by C-G formula based on SCr of 1.16 mg/dL (H)).   Medical History: Past Medical History:  Diagnosis Date  . Anemia    previous blood transfusions  . Arthritis    "all over"  . Asthma   . Bradycardia    requiring previous d/c of BB and reduction of amiodarone  . CAD (coronary artery disease)    nonobstructive per notes  . Chronic diastolic CHF (congestive heart failure) ([place])   . CKD (chronic kidney disease), stage III   . Complication of blood transfusion    "got the wrong blood type at [facility] in ~ 2015; no adverse reaction that we are aware of"/daughter, [name] (01/27/2016)  . COPD (chronic obstructive pulmonary disease) ([place])   . Depression    "light case"  . DVT (deep venous thrombosis) ([place]) 01/2016   a. LLE DVT 01/2016 - switched from Eliquis to  Coumadin.  . Gastric stenosis    a. s/p stomach tube  . GERD (gastroesophageal reflux disease)   . History of blood transfusion    "several" (01/27/2016)  . History of stomach ulcers   . Hyperlipidemia   . Hypertension   . Hypothyroidism   . Paraesophageal hernia   . Perforated gastric ulcer ([place])   . Seasonal allergies   . SIADH (syndrome of inappropriate ADH production) ([place])    [name] 01/10/2015  . Small bowel obstruction    "I don't know how many" (01/11/2015)  . Stroke ([place])    "light one"  . Type II diabetes mellitus ([place])    "related to prednisone use  for > 20 yr; once predinose stopped; no more DM RX" (01/27/2016)  . Ventral hernia with bowel obstruction     Assessment: 83 y/o F on warfarin PTA for aflutter, just discharged on 2/14, back to ED with worsening shortness of breath treated with ABX for possible pna. Pharmacy consulted to dose coumadin -INR= 2.2 after boost last pm  Will restart home Warfarin dose 3.75mg /day except 2.5mg  on Mon  Goal of Therapy:  INR 2-3 Monitor platelets by anticoagulation protocol: Yes   Plan:  -Warfarin  3.75mg /day except 2.5mg  on Mon -Daily PT/INR  Bonnita Nasuti Pharm.D. CPP, BCPS Clinical Pharmacist [phone] 05/25/2016 12:39 PM   c

## 2016-05-26 ENCOUNTER — Encounter: Payer: Self-pay | Admitting: Internal Medicine

## 2016-05-26 LAB — PROTIME-INR
INR: 2.22
Prothrombin Time: 25 seconds — ABNORMAL HIGH (ref 11.4–15.2)

## 2016-05-26 LAB — BASIC METABOLIC PANEL
Anion gap: 13 (ref 5–15)
BUN: 54 mg/dL — ABNORMAL HIGH (ref 6–20)
CO2: 25 mmol/L (ref 22–32)
Calcium: 9.4 mg/dL (ref 8.9–10.3)
Chloride: 97 mmol/L — ABNORMAL LOW (ref 101–111)
Creatinine, Ser: 1.14 mg/dL — ABNORMAL HIGH (ref 0.44–1.00)
GFR calc Af Amer: 48 mL/min — ABNORMAL LOW (ref >60)
GFR calc non Af Amer: 41 mL/min — ABNORMAL LOW (ref >60)
Glucose, Bld: 388 mg/dL — ABNORMAL HIGH (ref 65–99)
Potassium: 3.9 mmol/L (ref 3.5–5.1)
Sodium: 135 mmol/L (ref 135–145)

## 2016-05-26 LAB — GLUCOSE, CAPILLARY
Glucose-Capillary: 387 mg/dL — ABNORMAL HIGH (ref 65–99)
Glucose-Capillary: 208 mg/dL — ABNORMAL HIGH (ref 65–99)
Glucose-Capillary: 301 mg/dL — ABNORMAL HIGH (ref 65–99)
Glucose-Capillary: 246 mg/dL — ABNORMAL HIGH (ref 65–99)

## 2016-05-26 LAB — CBC
HCT: 32.4 % — ABNORMAL LOW (ref 36.0–46.0)
Hemoglobin: 10.8 g/dL — ABNORMAL LOW (ref 12.0–15.0)
MCH: 27.7 pg (ref 26.0–34.0)
MCHC: 33.3 g/dL (ref 30.0–36.0)
MCV: 83.1 fL (ref 78.0–100.0)
Platelets: 417 10*3/uL — ABNORMAL HIGH (ref 150–400)
RBC: 3.9 MIL/uL (ref 3.87–5.11)
RDW: 19.4 % — ABNORMAL HIGH (ref 11.5–15.5)
WBC: 15.3 10*3/uL — ABNORMAL HIGH (ref 4.0–10.5)

## 2016-05-26 LAB — LACTIC ACID, PLASMA
Lactic Acid, Venous: 3.5 mmol/L (ref 0.5–1.9)
Lactic Acid, Venous: 2.1 mmol/L (ref 0.5–1.9)

## 2016-05-26 IMAGING — MR MR HEAD W/O CM
9 of 13 series · 23 of 48 positions shown · non-contrast
Comparison: Head CT without contrast 12/23/2013 at 7517 hrs.

CLINICAL DATA: 89-year-old female with episode of slurred speech,
weakness and diaphoresis. Code stroke. Query seizure. Initial
encounter.

EXAM:
MRI HEAD WITHOUT CONTRAST
MRA HEAD WITHOUT CONTRAST
MRA NECK WITHOUT CONTRAST
TECHNIQUE: Multiplanar, multiecho pulse sequences of the brain and surrounding
structures were obtained without intravenous contrast. Angiographic
images of the Circle of Willis were obtained using MRA technique
without intravenous contrast. Angiographic images of the neck were
obtained using MRA technique without intravenous contrast. Carotid
stenosis measurements (when applicable) are obtained utilizing
NASCET criteria, using the distal internal carotid diameter as the
denominator.

[Series 3: FLAIR · sagittal · 5.0mm · 0.47mm/px · 2 of 23 slices shown (1 of 2)]
[im 1/23]
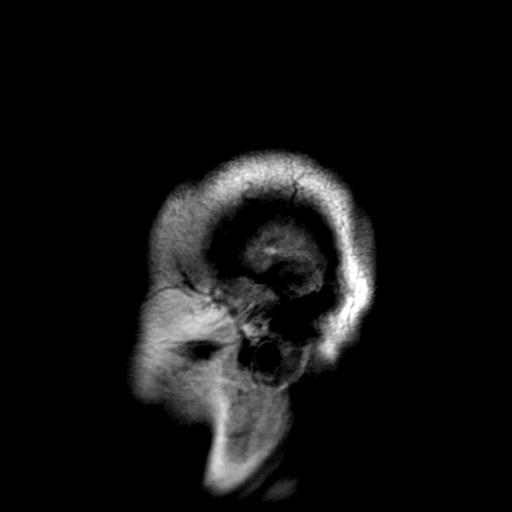
[im 23/23]
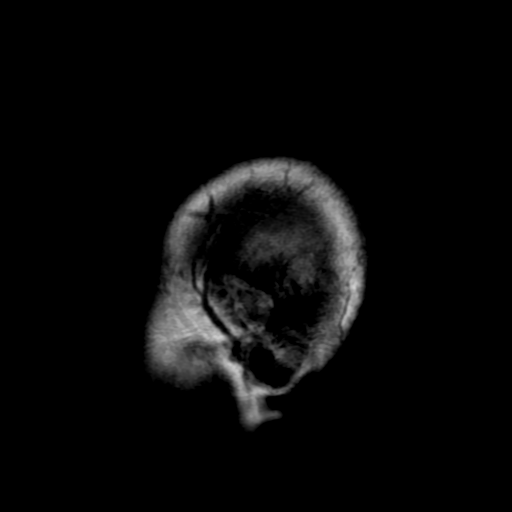

[Series 6: T2 · axial · 5.0mm · 0.43mm/px · z∈[-100,+30]mm · 2 of 24 slices shown (1 of 2)]
[im 1/24]
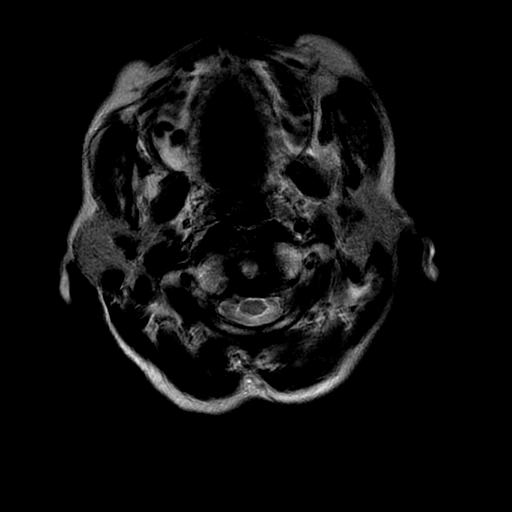
[im 24/24]
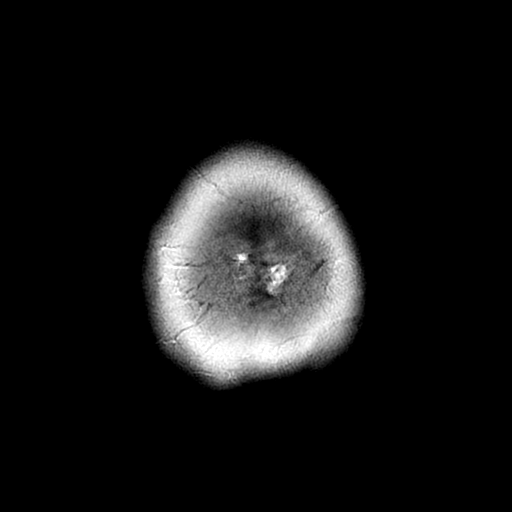

[Series 8: ax (id) 2 · axial · 1.4mm · 0.43mm/px · z∈[-90,+10]mm · 8 of 152 slices shown]
[im 1/152]
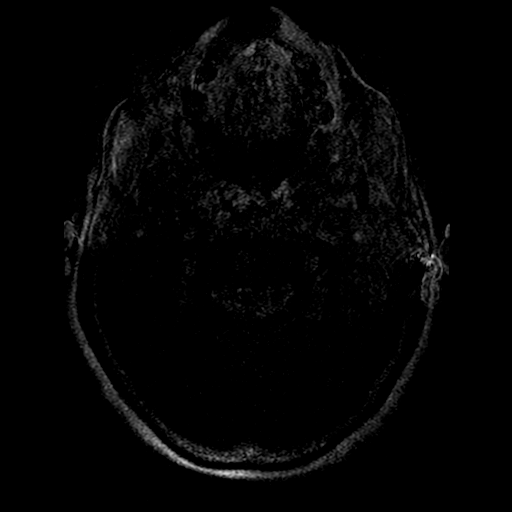
[im 22/152]
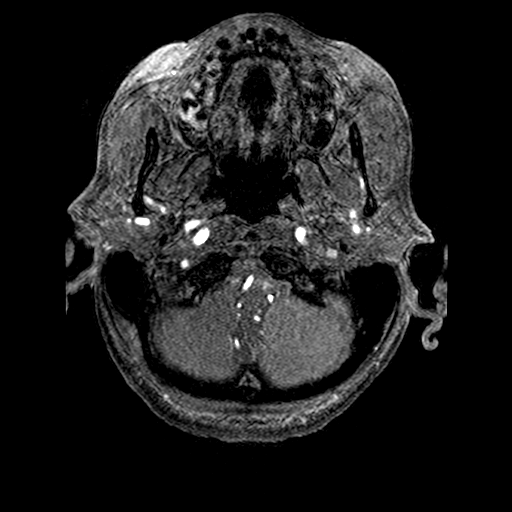
[im 44/152]
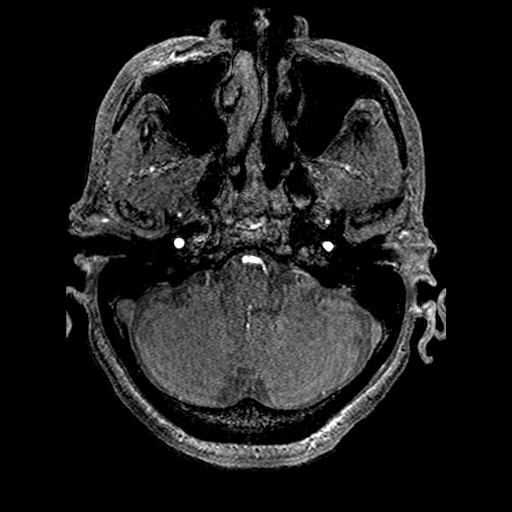
[im 65/152]
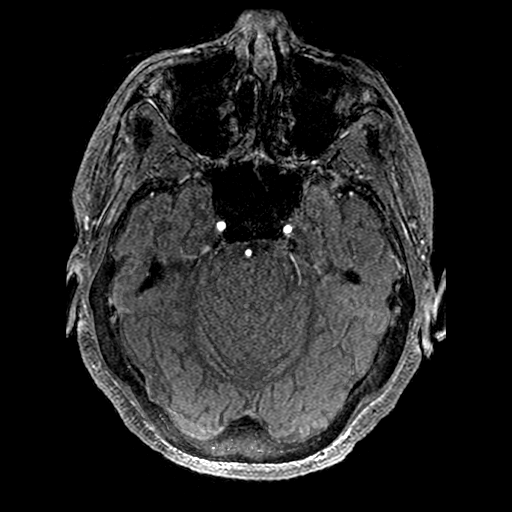
[im 87/152]
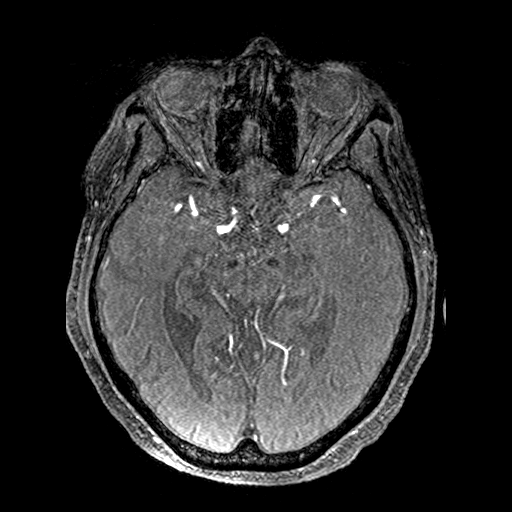
[im 108/152]
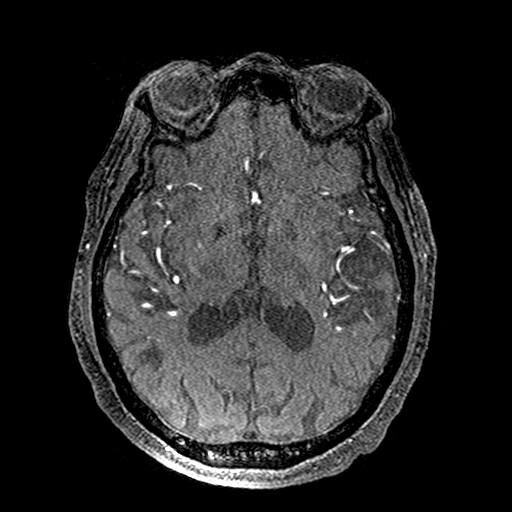
[im 130/152]
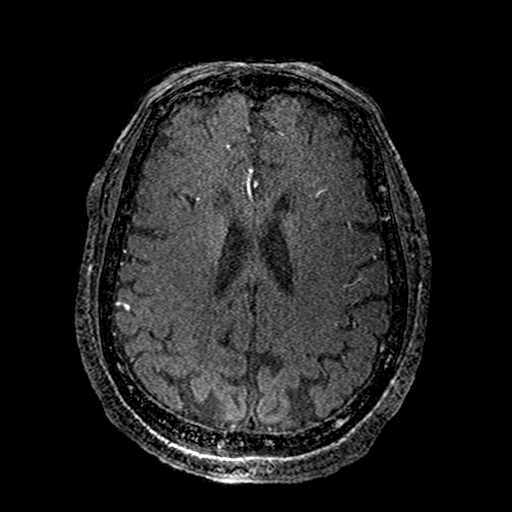
[im 152/152]
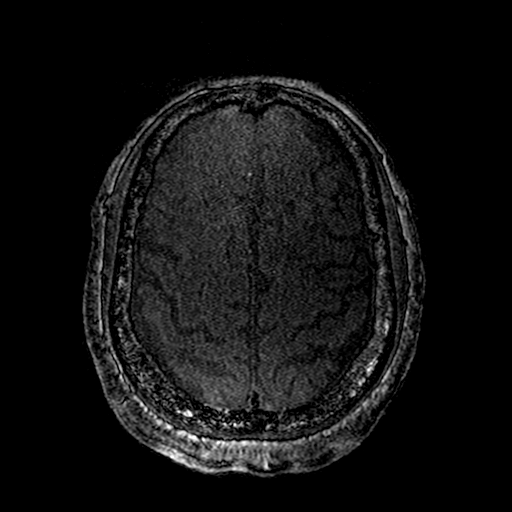

[Series 9: FLAIR · axial · 5.0mm · 0.43mm/px · 1 of 24 slices shown (2 of 2)]
[im 1/24]
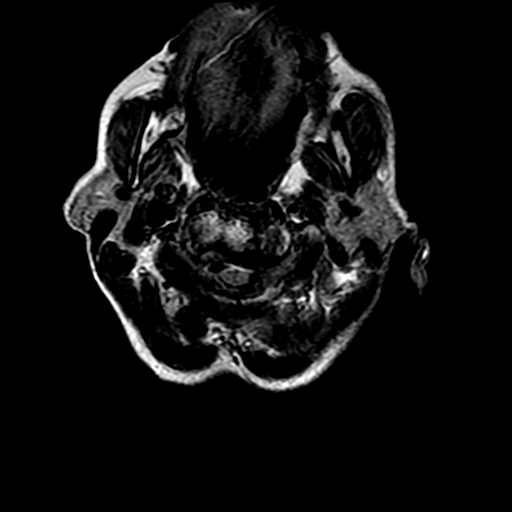

[Series 10: DWI · coronal · 5.0mm · 1.02mm/px · 3 of 68 slices shown (1 of 4)]
[im 1/68]
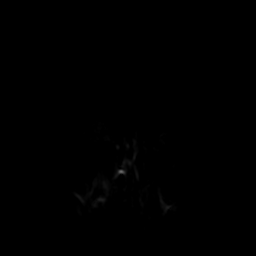
[im 34/68]
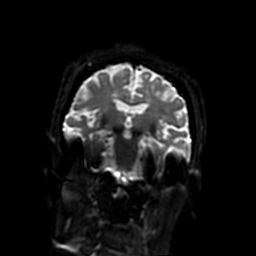
[im 68/68]
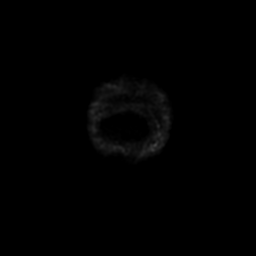

[Series 11: DWI · axial · 5.0mm · 1.02mm/px · z∈[-97,+30]mm · 3 of 56 slices shown (2 of 4)]
[im 1/56]
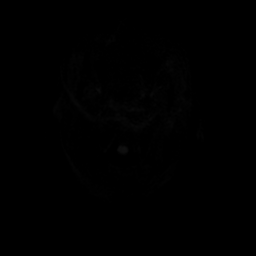
[im 28/56]
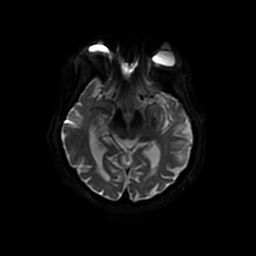
[im 56/56]
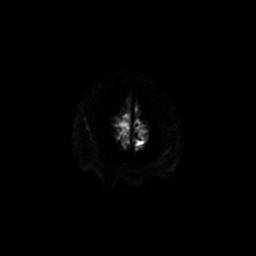

[Series 14: T2 · coronal · 5.0mm · 0.47mm/px · 1 of 27 slices shown (2 of 2)]
[im 1/27]
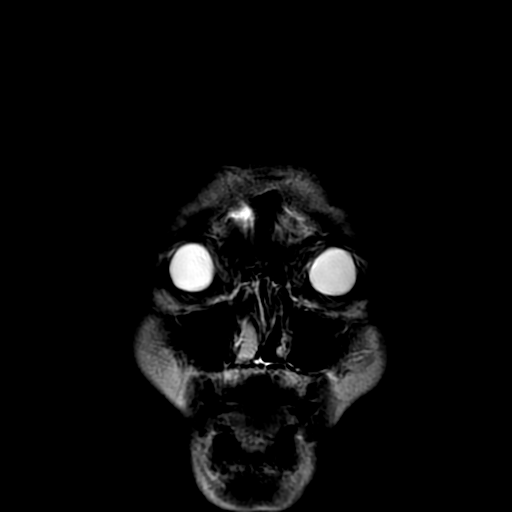

[Series 1000: DWI · coronal · 5.0mm · 1.02mm/px · 2 of 34 slices shown (3 of 4)]
[im 1/34]
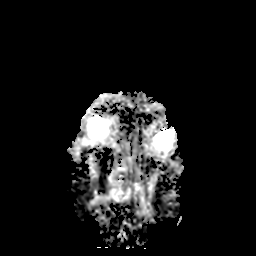
[im 34/34]
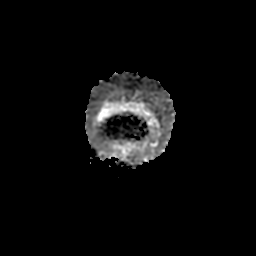

[Series 1100: DWI · axial · 5.0mm · 1.02mm/px · 1 of 28 slices shown (4 of 4)]
[im 1/28]
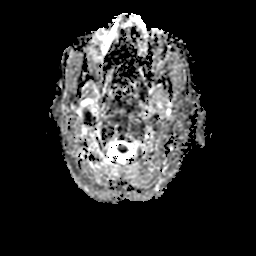

[23 of 48 positions shown; findings below may reference images not displayed]

FINDINGS: MRI HEAD FINDINGS

Study is intermittently degraded by motion artifact despite repeated
imaging attempts.

No restricted diffusion to suggest acute infarction. No midline
shift, mass effect, evidence of mass lesion, ventriculomegaly,
extra-axial collection or acute intracranial hemorrhage.
Cervicomedullary junction and pituitary are within normal limits.
Major intracranial vascular flow voids are preserved.

Small chronic infarcts in both cerebellar hemispheres, an the
bilateral thalami. Patchy cerebral white matter T2 and FLAIR
hyperintensity, with some white matter signal changes most
resembling chronic lacunar infarcts. Occasional chronic micro
hemorrhages in the brain.

Visible internal auditory structures appear normal. Visualized
paranasal sinuses and mastoids are clear. Postoperative changes to
the globes. Visualized scalp soft tissues are within normal limits.
Normal bone marrow signal.

Heterogeneity of the C3-C4 endplates in the visible upper cervical
spine is felt to be degenerative in nature.

MRA HEAD FINDINGS

Antegrade flow in the posterior circulation. Codominant distal
vertebral arteries. Normal PICA and AICA origins. Patent
vertebrobasilar junction. No basilar stenosis. SCA and PCA origins
are within normal limits. Tortuous right posterior communicating
artery, the left is diminutive or absent. Bilateral PCA branches are
within normal limits.

Antegrade flow in both ICA siphons. Tortuous distal cervical right
ICA. Siphon irregularity in keeping with atherosclerosis, but no
siphon stenosis. Ophthalmic and right posterior communicating artery
origins are within normal limits. Patent carotid termini. Normal MCA
and ACA origins. Anterior communicating artery within normal limits.

High-grade tandem left ACA A2 segment and pericholecystic all region
stenosis, with attenuated left ACA flow. See series 8, image 46.
Visualized right ACA branches are within normal limits.

Bilateral MCA M1 segments are within normal limits. Both MCA
bifurcations are patent. Visualized bilateral MCA branches are
within normal limits.

MRA NECK FINDINGS

Time-of-flight imaging demonstrates antegrade flow in both carotid
and vertebral arteries in the neck. There is irregularity and loss
of flow signal at both carotid bifurcations, greater on the right.
The appearance of the right ICA origin is suspicious for
hemodynamically significant stenosis (series 16, image 35). Tortuous
distal cervical right ICA.
IMPRESSION: 1. No acute intracranial abnormality. Moderately advanced chronic
small vessel ischemia.
2. Carotid atherosclerosis and tortuosity in the neck, greater on
the right. The appearance is suspicious for right ICA origin
hemodynamically significant stenosis. Carotid doppler may be the
simplest way to confirm.
3. Tandem high-grade stenoses of the left ACA, with subsequent poor
flow in that vessel.
4. Otherwise negative for age intracranial MRA.

## 2016-05-26 MED ORDER — FUROSEMIDE 10 MG/ML IJ SOLN
40.0000 mg | Freq: Once | INTRAMUSCULAR | Status: AC
  Administered 2016-05-26: 40 mg via INTRAVENOUS
  Filled 2016-05-26: qty 4

## 2016-05-26 MED ORDER — HYDRALAZINE HCL 20 MG/ML IJ SOLN
10.0000 mg | Freq: Four times a day (QID) | INTRAMUSCULAR | Status: DC | PRN
  Administered 2016-05-26: 10 mg via INTRAVENOUS
  Filled 2016-05-26: qty 1

## 2016-05-26 MED ORDER — VANCOMYCIN HCL IN DEXTROSE 1-5 GM/200ML-% IV SOLN
1000.0000 mg | INTRAVENOUS | Status: DC
  Administered 2016-05-26 – 2016-05-28 (×3): 1000 mg via INTRAVENOUS
  Filled 2016-05-26 (×4): qty 200

## 2016-05-26 MED ORDER — SODIUM CHLORIDE 0.9 % IV BOLUS (SEPSIS)
500.0000 mL | Freq: Once | INTRAVENOUS | Status: AC
  Administered 2016-05-26: 500 mL via INTRAVENOUS

## 2016-05-26 NOTE — Progress Notes (Signed)
Occupational Therapy Treatment Patient Details Name: Shannon [name] MRN: [id number] DOB: 02-Feb-1924 Today's Date: 05/26/2016    History of present illness 83 year old female with past medical history significant for hypertension, dyslipidemia, CVA, COPD and asthma, chronic atrial fibrillation, diastolic congestive heart failure, diabetes, hypothyroidism, gastric outlet obstruction with prior Gtube. Patient presented with shortness of breath ever since she left the hospital 05/17/2016. Last hospitalization from 05/08/2016 through 05/17/2016 for community-acquired pneumonia and respiratory failure   OT comments  Pt enjoying a visit from her son who lives in [place] state. Performed toileting and grooming activities with min assist. Pt is hopeful to return home soon.  Follow Up Recommendations  No OT follow up;Supervision/Assistance - 24 hour    Equipment Recommendations  None recommended by OT    Recommendations for Other Services      Precautions / Restrictions Precautions Precautions: Fall       Mobility Bed Mobility      General bed mobility comments: received in chair  Transfers Overall transfer level: Needs assistance Equipment used: Rolling walker (2 wheeled) Transfers: Sit to/from Stand Sit to Stand: Min assist         General transfer comment: stability assist during stand up    Balance Overall balance assessment: Needs assistance Sitting-balance support: Feet supported;No upper extremity supported Sitting balance-Leahy Scale: Good     Standing balance support: During functional activity;Bilateral upper extremity supported Standing balance-Leahy Scale: Poor Standing balance comment: reliant on at least one UE for support with standing ADL                   ADL Overall ADL's : Needs assistance/impaired     Grooming: Wash/dry hands;Sitting;Set up           Upper Body Dressing : Minimal assistance;Sitting       Toilet Transfer: Min  guard;Stand-pivot;RW   Toileting- Clothing Manipulation and Hygiene: Minimal assistance;Sit to/from stand                Vision                     Perception     Praxis      Cognition   Behavior During Therapy: [facility] for tasks assessed/performed Overall Cognitive Status: Within Functional Limits for tasks assessed                         Exercises     Shoulder Instructions       General Comments      Pertinent Vitals/ Pain       Pain Assessment: No/denies pain Faces Pain Scale: No hurt  Home Living                                          Prior Functioning/Environment              Frequency  Min 2X/week        Progress Toward Goals  OT Goals(current goals can now be found in the care plan section)  Progress towards OT goals: Progressing toward goals  Acute Rehab OT Goals Patient Stated Goal: to get better Time For Goal Achievement: 06/07/16 Potential to Achieve Goals: Fair  Plan Discharge plan remains appropriate    Co-evaluation                 End of Session  Equipment Utilized During Treatment: Gait belt;Oxygen;Rolling walker  OT Visit Diagnosis: Unsteadiness on feet (R26.81);Other abnormalities of gait and mobility (R26.89);Muscle weakness (generalized) (M62.81);Pain   Activity Tolerance Patient tolerated treatment well   Patient Left in chair;with call bell/phone within reach;with family/visitor present   Nurse Communication          Time: 1345-1401 OT Time Calculation (min): 16 min  Charges: OT General Charges $OT Visit: 1 Procedure OT Treatments $Self Care/Home Management : 8-22 mins    Evern Bio 05/26/2016, 2:10 PM  [phone]

## 2016-05-26 NOTE — Progress Notes (Addendum)
Paged Herbie Baltimore, Speech Therapist at [phone] as requested by Dr. Erlinda Hong.  Need clarification as to medication route to be given.  Per pt, and as I have witnessed, pt prefers and is stable to take pills whole with thickened liquid.  However, per speech Progress Note, pt is at high risk for aspiration.   Am waiting for return call from speech.

## 2016-05-26 NOTE — Progress Notes (Signed)
Peg tube dressing changed via daughter at bedside.

## 2016-05-26 NOTE — Progress Notes (Signed)
Patient ID: Shannon [name], female   DOB: 1924-01-29, 83 y.o.   MRN: [id number]  PROGRESS NOTE    Yolandra Masella  ONG:[id number] DOB: 1923/12/13 DOA: 05/22/2016  PCP: Kirt Boys, DO   Brief Narrative:  83 year old female with past medical history significant for hypertension, dyslipidemia, CVA, COPD and asthma, chronic atrial fibrillation, diastolic congestive heart failure, diabetes, hypothyroidism, gastric outlet obstruction with prior G2. Patient presented with shortness of breath ever since she left the hospital 05/17/2016.[name] Last hospitalization from 05/08/2016 through 05/17/2016 for community-acquired pneumonia and respiratory failure.  Patient was given nebulizer treatments, BiPAP but continued to be short of breath for which reason she was admitted she was given vancomycin and aztreonam for possible healthcare associated pneumonia but her chest x-ray actually revealed resolving pneumonia. Urinalysis was negative for leukocytes.  Assessment & Plan:  Acute on chronic respiratory failure with hypoxia / Acute COPD exacerbation/recurrent aspiration ( per patient  She is not dependent on 02 at baseline) - Likely combination of pneumonia as well as COPD exacerbation, aspiration - she initially required bipap, now on 4liter, still have dyspnea with talking, still have congested cough -Continue Brovana and Pulmicort nebulizer twice daily, stop solumedrol and start prednisone  - Continue DuoNeb nebulizer 3 times daily as well as every 2 hours as needed for shortness of breath or wheezing - Continue oxygen support via [place] to keep O2 sats above 90%   Sepsis / Healthcare associated pneumonia / aspiration pneumonia/Lactic acidosis  - Sepsis criteria met on the admission with tachypnea, hypoxia, lactic acidosis of 2.3 and 3 - Source of infection likely healthcare associated pneumonia considering patient's last recent hospitalization within 5 days, and recurrent aspiration pneumonia -She is on J tube  feeds with minimal PO intake at home,  Per SLP eval - nectar thick liquids  - Continue cefepime and vanco stopped 2/21, she remain has elevated lactic acid, mrsa screen is position, restart vanc - Blood cx results negative so far  - Respiratory virus panel is negative    Steroid induced hyperglycemia - Continue SSI  Persistent atrial fibrillation - CHADS vasc score 6 - Continue anticoagulation with coumadin - Rate controlled with bisoprolol and Cardizem  Chronic diastolic CHF - EF 60% based on 2 D ECHO 05/09/2016 - BNP in 200 on this admission - Continue current meds  Chronic kidney disease stage 3 - Cr on this admission 1.31, at baseline compared to recent creatinine values   Essential hypertension - Continue Cardizem and Bisoprolol - Continue Hydralazine and Imdur   Hypothyroidism - Continue synthroid   Dyslipidemia - Continue statin therapy   DVT prophylaxis: On Coumadin  Code Status: DNR/DNI Family Communication: patient Disposition Plan: d/c when able to wean off oxygen, may need home o2 if not able to wean   Consultants:   SLP  PT  Palliative care  Procedures:   BiPAP  Antimicrobials:   Vanco 05/22/2016 --> 05/24/2016  , restarted on 2/23  Cefepime 05/22/2016 -->   Subjective: Significant dyspnea when talking, on 4liter oxygen, congested cough  Objective: Vitals:   05/26/16 0500 05/26/16 0526 05/26/16 0750 05/26/16 1025  BP:  (!) 160/64  (!) 180/67  Pulse:  68  78  Resp:    18  Temp:    98 F (36.7 C)  TempSrc:    Oral  SpO2:   96% 100%  Weight: 61.4 kg (135 lb 6.4 oz)     Height:        Intake/Output Summary (Last 24 hours) at 05/26/16  1046 Last data filed at 05/26/16 1037  Gross per 24 hour  Intake             1482 ml  Output             1800 ml  Net             -318 ml   Filed Weights   05/22/16 0455 05/25/16 1618 05/26/16 0500  Weight: 63 kg (138 lb 14.2 oz) 61.7 kg (136 lb 1.6 oz) 61.4 kg (135 lb 6.4 oz)     Examination:  General exam: significant dyspnea when talk, aaox3  Respiratory system: Rhonchorous but less wheezing this am Cardiovascular system: S1 & S2 heard, Rate controlled  Gastrointestinal system: (+) BS, non tender, PEG tube in place  Central nervous system: No focal deficits  Extremities: No swelling, palpable pulses Skin: skin is warm and dry  Psychiatry: Normal mood and behavior, no restlessness   Data Reviewed: I have personally reviewed following labs and imaging studies  CBC:  Recent Labs Lab 05/22/16 0101 05/22/16 0117 05/24/16 0340 05/25/16 0335 05/26/16 0458  WBC 8.3  --  18.2* 15.5* 15.3*  NEUTROABS 4.9  --   --   --   --   HGB 11.4* 12.6 10.1* 10.8* 10.8*  HCT 33.1* 37.0 30.1* 32.1* 32.4*  MCV 83.0  --  81.8 82.9 83.1  PLT 418*  --  399 391 417*   Basic Metabolic Panel:  Recent Labs Lab 05/22/16 0101 05/22/16 0117 05/24/16 0340 05/25/16 0335 05/26/16 0458  NA 131* 131* 130* 134* 135  K 4.5 4.4 4.3 4.4 3.9  CL 93* 96* 96* 96* 97*  CO2 26  --  24 26 25   GLUCOSE 178* 171* 398* 327* 388*  BUN 47* 48* 54* 52* 54*  CREATININE 1.31* 1.30* 1.23* 1.16* 1.14*  CALCIUM 9.8  --  9.3 9.3 9.4   GFR: Estimated Creatinine Clearance: 30.7 mL/min (by C-G formula based on SCr of 1.14 mg/dL (H)). Liver Function Tests:  Recent Labs Lab 05/22/16 0101  AST 50*  ALT 68*  ALKPHOS 87  BILITOT 0.6  PROT 7.6  ALBUMIN 3.5   No results for input(s): LIPASE, AMYLASE in the last 168 hours. No results for input(s): AMMONIA in the last 168 hours. Coagulation Profile:  Recent Labs Lab 05/22/16 0717 05/23/16 0634 05/24/16 0340 05/25/16 0335 05/26/16 0458  INR 1.90 1.91 1.93 2.22 2.22   Cardiac Enzymes: No results for input(s): CKTOTAL, CKMB, CKMBINDEX, TROPONINI in the last 168 hours. BNP (last 3 results)  Recent Labs  05/08/16 1053  PROBNP 1,132*   HbA1C: No results for input(s): HGBA1C in the last 72 hours. CBG:  Recent Labs Lab  05/25/16 0834 05/25/16 1207 05/25/16 1659 05/25/16 2149 05/26/16 0719  GLUCAP 451* 215* 176* 203* 387*   Lipid Profile: No results for input(s): CHOL, HDL, LDLCALC, TRIG, CHOLHDL, LDLDIRECT in the last 72 hours. Thyroid Function Tests: No results for input(s): TSH, T4TOTAL, FREET4, T3FREE, THYROIDAB in the last 72 hours. Anemia Panel: No results for input(s): VITAMINB12, FOLATE, FERRITIN, TIBC, IRON, RETICCTPCT in the last 72 hours. Urine analysis:    Component Value Date/Time   COLORURINE YELLOW 05/22/2016 0155   APPEARANCEUR HAZY (A) 05/22/2016 0155   LABSPEC 1.011 05/22/2016 0155   PHURINE 7.0 05/22/2016 0155   GLUCOSEU NEGATIVE 05/22/2016 0155   HGBUR NEGATIVE 05/22/2016 0155   BILIRUBINUR NEGATIVE 05/22/2016 0155   KETONESUR NEGATIVE 05/22/2016 0155   PROTEINUR 100 (A) 05/22/2016 0155  UROBILINOGEN 0.2 01/27/2015 2308   NITRITE NEGATIVE 05/22/2016 0155   LEUKOCYTESUR NEGATIVE 05/22/2016 0155   Sepsis Labs: @LABRCNTIP (procalcitonin:4,lacticidven:4)  Blood Culture (routine x 2)     Status: None (Preliminary result)   Collection Time: 05/22/16  1:01 AM  Result Value Ref Range Status   Specimen Description BLOOD RIGHT FOREARM  Final   Culture NO GROWTH 1 DAY  Final   Report Status PENDING  Incomplete  Urine culture     Status: None   Collection Time: 05/22/16  1:55 AM  Result Value Ref Range Status   Specimen Description URINE, CLEAN CATCH  Final   Special Requests NONE  Final   Culture NO GROWTH  Final   Report Status 05/23/2016 FINAL  Final  MRSA PCR Screening     Status: Abnormal   Collection Time: 05/22/16  4:27 AM  Result Value Ref Range Status   MRSA by PCR POSITIVE (A) NEGATIVE Final  Blood Culture (routine x 2)     Status: None (Preliminary result)   Collection Time: 05/22/16  4:51 AM  Result Value Ref Range Status   Specimen Description BLOOD RIGHT HAND  Final   Culture NO GROWTH 1 DAY  Final   Report Status PENDING  Incomplete  Respiratory Panel by  PCR     Status: None   Collection Time: 05/22/16 10:30 AM  Result Value Ref Range Status   Adenovirus NOT DETECTED NOT DETECTED Final   Coronavirus 229E NOT DETECTED NOT DETECTED Final   Coronavirus HKU1 NOT DETECTED NOT DETECTED Final   Coronavirus NL63 NOT DETECTED NOT DETECTED Final   Coronavirus OC43 NOT DETECTED NOT DETECTED Final   Metapneumovirus NOT DETECTED NOT DETECTED Final   Rhinovirus / Enterovirus NOT DETECTED NOT DETECTED Final   Influenza A NOT DETECTED NOT DETECTED Final   Influenza B NOT DETECTED NOT DETECTED Final   Parainfluenza Virus 1 NOT DETECTED NOT DETECTED Final   Parainfluenza Virus 2 NOT DETECTED NOT DETECTED Final   Parainfluenza Virus 3 NOT DETECTED NOT DETECTED Final   Parainfluenza Virus 4 NOT DETECTED NOT DETECTED Final   Respiratory Syncytial Virus NOT DETECTED NOT DETECTED Final   Bordetella pertussis NOT DETECTED NOT DETECTED Final   Chlamydophila pneumoniae NOT DETECTED NOT DETECTED Final   Mycoplasma pneumoniae NOT DETECTED NOT DETECTED Final      Radiology Studies: Dg Chest Port 1 View Result Date: 05/22/2016  Interval improvement of the left lung base opacity with near complete resolution. Small left pleural effusion with minimal left lung base atelectasis versus residual infiltrate. Clinical correlation and follow-up recommended.   Scheduled Meds: . arformoterol  15 mcg Nebulization BID  . atorvastatin  10 mg Per Tube q1800  . bisoprolol  5 mg Per Tube Daily  . budesonide   0.5 mg Nebulization BID  . ceFEPime  1 g Intravenous Q24H  . diltiazem  120 mg Oral Daily  . feeding supplement (NEPRO CARB STEADY)  237 mL Oral BID BM  . feeding supplement OSMOLITE   65 mL/hr Per Tube Q24H  . fluticasone  2 spray Each Nare Daily  . free water  230 mL Per Tube TID  . hydrALAZINE  50 mg Per Tube TID  . ipratropium-albuter  3 mL Nebulization TID  . isosorbide mononitrate  30 mg Oral Daily  . levothyroxine  137 mcg Oral QAC breakfast  .  methylPREDNISolo  60 mg Intravenous Q12H  . multivitamin with minerals  1 tablet Oral Daily  . pantoprazole (PROTONIX) IV  40 mg Intravenous Q12H  . vancomycin  1,000 mg Intravenous Q24H  . warfarin  5 mg Oral ONCE-1800   Continuous Infusions:   LOS: 4 days    Time spent: 35 minutes  Greater than 50% of the time spent on counseling and coordinating the care.   Albertine Grates, MD PhD [facility] Pager [phone]  If 7PM-7AM, please contact night-coverage [email or web address] Password [facility] 05/26/2016, 10:46 AM

## 2016-05-26 NOTE — Progress Notes (Signed)
Paged Rober Minion, Speech Therapy, at [phone] at 1400, and again at 1537, with no reply.

## 2016-05-26 NOTE — Progress Notes (Signed)
Pharmacy Antibiotic Note  Shannon [name] is a 83 y.o. female admitted on 05/22/2016 with pneumonia.  Pharmacy has been consulted for vancomycin dosing. Patient is on cefepime dosed appropriately.  Plan: Vancomycin 1g IV every 24 hours.  Goal trough 15-20 mcg/mL.  Cefepime 1g IV q24h Monitor culture data, renal function and clinical course VT at SS prn  Height: 5\' 6"  (167.6 cm) Weight: 135 lb 6.4 oz (61.4 kg) IBW/kg (Calculated) : 59.3  Temp (24hrs), Avg:98 F (36.7 C), Min:97.5 F (36.4 C), Max:98.4 F (36.9 C)   Recent Labs Lab 05/22/16 0101 05/22/16 0117 05/22/16 0452 05/22/16 0713 05/24/16 0340 05/25/16 0335 05/26/16 0458  WBC 8.3  --   --   --  18.2* 15.5* 15.3*  CREATININE 1.31* 1.30*  --   --  1.23* 1.16* 1.14*  LATICACIDVEN  --  1.13 2.3* 3.0*  --  2.1* 3.5*    Estimated Creatinine Clearance: 30.7 mL/min (by C-G formula based on SCr of 1.14 mg/dL (H)).    Allergies  Allergen Reactions  . Penicillins Itching, Rash and Other (See Comments)    Has patient had a PCN reaction causing immediate rash, facial/tongue/throat swelling, SOB or lightheadedness with hypotension: Yes Has patient had a PCN reaction causing severe rash involving mucus membranes or skin necrosis: Unk Has patient had a PCN reaction that required hospitalization: Unk Has patient had a PCN reaction occurring within the last 10 years: Unk If all of the above answers are "NO", then may proceed with Cephalosporin use.      Andrey Cota. Diona Foley, PharmD, BCPS Clinical Pharmacist [phone] 05/26/2016 10:34 AM

## 2016-05-26 NOTE — Progress Notes (Signed)
CRITICAL VALUE ALERT  Critical value received: Lactic acid =3.5  Date of notification:  05/26/2016  Time of notification:  0635  Critical value read back:Yes.    Nurse who received alert: r. colimbres  MD notified (1st page):  K.Schorr  Time of first page:  [phone]  MD notified (2nd page):  Time of second page:  Responding MD: waiting  Time MD responded:  waiting

## 2016-05-26 NOTE — Progress Notes (Signed)
CRITICAL VALUE ALERT  Critical value received:  Lactic acid = 2.1   Date of notification:  05/26/16  Time of notification:  1130  Critical value read back:  yes  Nurse who received alert:  Glean Hess RN  MD notified (1st page):  Dr. Erlinda Hong  Time of first page:  1135  MD notified (2nd page):  Time of second page:  Responding MD:    Time MD responded:

## 2016-05-26 NOTE — Progress Notes (Signed)
Physical Therapy Treatment Patient Details Name: Shannon [name] MRN: [id number] DOB: 09-Dec-1923 Today's Date: 05/26/2016    History of Present Illness 83 year old female with past medical history significant for hypertension, dyslipidemia, CVA, COPD and asthma, chronic atrial fibrillation, diastolic congestive heart failure, diabetes, hypothyroidism, gastric outlet obstruction with prior Gtube. Patient presented with shortness of breath ever since she left the hospital 05/17/2016. Last hospitalization from 05/08/2016 through 05/17/2016 for community-acquired pneumonia and respiratory failure    PT Comments    Progressing steadily.  Will likely need Oxygen at home for a little longer.  Otherwise gait is generally steady and pt is at min to min guard level.    Follow Up Recommendations  Home health PT     Equipment Recommendations  None recommended by PT    Recommendations for Other Services       Precautions / Restrictions Precautions Precautions: Fall    Mobility  Bed Mobility Overal bed mobility: Needs Assistance Bed Mobility: Supine to Sit     Supine to sit: Supervision        Transfers Overall transfer level: Needs assistance Equipment used: Rolling walker (2 wheeled) Transfers: Sit to/from Stand Sit to Stand: Min assist         General transfer comment: stability assist during stand up  Ambulation/Gait Ambulation/Gait assistance: Min guard Ambulation Distance (Feet): 230 Feet Assistive device: Rolling walker (2 wheeled) Gait Pattern/deviations: Step-through pattern;Decreased stride length;Trunk flexed Gait velocity: decreased Gait velocity interpretation: Below normal speed for age/gender General Gait Details: 2 standing rests propped against the wall.  Generally steady.  On RA during gait sats dropped to 82% at 84 bpm.  on 3 L [place] sats increased  to 93% at 81 bpm.   Stairs            Wheelchair Mobility    Modified Rankin (Stroke Patients  Only)       Balance Overall balance assessment: Needs assistance Sitting-balance support: Feet supported;No upper extremity supported Sitting balance-Leahy Scale: Good     Standing balance support: During functional activity;Bilateral upper extremity supported Standing balance-Leahy Scale: Poor Standing balance comment: reliant on bilateral UE supports on RW                     Cognition Arousal/Alertness: Awake/alert Behavior During Therapy: WFL for tasks assessed/performed Overall Cognitive Status: Within Functional Limits for tasks assessed                      Exercises      General Comments        Pertinent Vitals/Pain Pain Assessment: Faces Faces Pain Scale: No hurt    Home Living                      Prior Function            PT Goals (current goals can now be found in the care plan section) Acute Rehab PT Goals Patient Stated Goal: to get better PT Goal Formulation: With patient Time For Goal Achievement: 06/07/16 Potential to Achieve Goals: Good Progress towards PT goals: Progressing toward goals    Frequency    Min 3X/week      PT Plan Current plan remains appropriate    Co-evaluation             End of Session Equipment Utilized During Treatment: Gait belt;Oxygen Activity Tolerance: Patient tolerated treatment well Patient left: in chair;with chair alarm set;with call bell/phone within reach  Nurse Communication: Mobility status PT Visit Diagnosis: Unsteadiness on feet (R26.81)     Time: 4132-4401 PT Time Calculation (min) (ACUTE ONLY): 21 min  Charges:  $Gait Training: 8-22 mins                    G CodesEliseo Gum Sharmayne Jablon 05/26/2016, 1:19 PM 05/26/2016  [place]el[place] Bing, PT [phone]  (pager)

## 2016-05-26 NOTE — Progress Notes (Signed)
BP 192/73, HR 77.  Notified MD.

## 2016-05-26 NOTE — Progress Notes (Signed)
Results for KHAMILLE, BEYNON (MRN [id number]) as of 05/26/2016 07:47  Ref. Range 05/25/2016 08:34 05/25/2016 12:07 05/25/2016 16:59 05/25/2016 21:49 05/26/2016 07:19  Glucose-Capillary Latest Ref Range: 65 - 99 mg/dL 451 (H) 215 (H) 176 (H) 203 (H) 387 (H)  Noted that blood sugars have been greater than 180 mg/dl. Recommend changing Novolog SENSITIVE correction scale to every 4 hours if on continuous tube feedings and patient not eating. Will continue to monitor blood sugars while in the hospital. Harvel Ricks RN BSN CDE

## 2016-05-26 NOTE — Consult Note (Signed)
[facility] CM Inpatient Consult   05/26/2016  Shannon [name] 18-May-1923 [id number]   Patient was receiving post hospital EMMI calls for follow up and [facility] Telephonic RNCM support.  Patient is very well known with this Clinical research associate.  Chart reviewed from Palliative notes as well regarding the patient and family are planning to return home with Home Health Care.  This patient has had 5 hospitalizations in the past 6 months noted.    Met with the patient and her daughter Shannon [name] regarding the community care needs.  Daughter states that they did receive the follow up calls from [facility] but, her symptoms were happening on the weekend and she had no one to call. Inquired to see if she called the 24 hour nurse advise line [previously provided information] and she states she did not remember receiving that information. Reminded the daughter and patient that [facility] Care Management can follow with an assigned nurse and be evaluated for monthly visits.  The daughter states she felt that having home care with [facility] was going well.  She states she had had Palliative Care that had started but they had sent her a bill in which she had no idea that they would be billed for the services.  She states, "it was no an enormous amount it was just the principle of them not disclosing that it was a service that they would be billing [place] for.  This made me angry because I don't think I was 'heard'.  It was not about the money as it was only $13.00 but it's they we were not informed.  Human resources officer showed empathy and understanding.  Asked her regarding considering home palliative again.  She states that the agency was out of [place] she believed and she knows what to expect now.  She states when the nurse from [facility] stops coming she 'could' call palliative back she felt to start services.   She did not consent to community care management with home visits.  She did not decline the EMMI follow up.  A magnet with [facility] Care Management nurse  advise line was given and encouraged to call for needs.  She accepted the magnet and states she will place it on the refrigerator.  She states she hopes she doesn't have to bring her back states, "If we can just get rid of this pneumonia.." For questions, please contact:  Charlesetta Shanks, RN BSN CCM [facility]  [phone] business mobile phone Toll free office [phone]

## 2016-05-27 ENCOUNTER — Inpatient Hospital Stay (HOSPITAL_COMMUNITY): Payer: Medicare Other

## 2016-05-27 LAB — CULTURE, BLOOD (ROUTINE X 2)
Culture: NO GROWTH
Culture: NO GROWTH

## 2016-05-27 LAB — GLUCOSE, CAPILLARY
Glucose-Capillary: 344 mg/dL — ABNORMAL HIGH (ref 65–99)
Glucose-Capillary: 237 mg/dL — ABNORMAL HIGH (ref 65–99)
Glucose-Capillary: 197 mg/dL — ABNORMAL HIGH (ref 65–99)
Glucose-Capillary: 282 mg/dL — ABNORMAL HIGH (ref 65–99)

## 2016-05-27 LAB — BASIC METABOLIC PANEL
Anion gap: 12 (ref 5–15)
BUN: 47 mg/dL — ABNORMAL HIGH (ref 6–20)
CO2: 27 mmol/L (ref 22–32)
Calcium: 9.5 mg/dL (ref 8.9–10.3)
Chloride: 101 mmol/L (ref 101–111)
Creatinine, Ser: 1.11 mg/dL — ABNORMAL HIGH (ref 0.44–1.00)
GFR calc Af Amer: 49 mL/min — ABNORMAL LOW (ref >60)
GFR calc non Af Amer: 42 mL/min — ABNORMAL LOW (ref >60)
Glucose, Bld: 374 mg/dL — ABNORMAL HIGH (ref 65–99)
Potassium: 3.5 mmol/L (ref 3.5–5.1)
Sodium: 140 mmol/L (ref 135–145)

## 2016-05-27 LAB — CBC
HCT: 32.2 % — ABNORMAL LOW (ref 36.0–46.0)
Hemoglobin: 10.9 g/dL — ABNORMAL LOW (ref 12.0–15.0)
MCH: 28 pg (ref 26.0–34.0)
MCHC: 33.9 g/dL (ref 30.0–36.0)
MCV: 82.8 fL (ref 78.0–100.0)
Platelets: 390 10*3/uL (ref 150–400)
RBC: 3.89 MIL/uL (ref 3.87–5.11)
RDW: 19.5 % — ABNORMAL HIGH (ref 11.5–15.5)
WBC: 14.4 10*3/uL — ABNORMAL HIGH (ref 4.0–10.5)

## 2016-05-27 LAB — LACTIC ACID, PLASMA
Lactic Acid, Venous: 2.9 mmol/L (ref 0.5–1.9)
Lactic Acid, Venous: 2.8 mmol/L (ref 0.5–1.9)

## 2016-05-27 LAB — PROTIME-INR
INR: 2.65
Prothrombin Time: 28.8 seconds — ABNORMAL HIGH (ref 11.4–15.2)

## 2016-05-27 LAB — MAGNESIUM: Magnesium: 2.2 mg/dL (ref 1.7–2.4)

## 2016-05-27 MED ORDER — PREDNISONE 20 MG PO TABS
40.0000 mg | ORAL_TABLET | Freq: Every day | ORAL | Status: DC
  Administered 2016-05-28: 40 mg via ORAL
  Filled 2016-05-27: qty 2

## 2016-05-27 MED ORDER — BISOPROLOL FUMARATE 5 MG PO TABS
10.0000 mg | ORAL_TABLET | Freq: Every day | ORAL | Status: DC
Start: 2016-05-28 — End: 2016-05-30
  Administered 2016-05-28 – 2016-05-30 (×3): 10 mg via ORAL
  Filled 2016-05-27 (×3): qty 2

## 2016-05-27 NOTE — Progress Notes (Signed)
Lactic acid 2.9, Dr. Hal Hope notified and wants lactic acid recheck again at 0900.

## 2016-05-27 NOTE — Progress Notes (Signed)
Patient ID: Shannon [name], female   DOB: 1923/11/26, 83 y.o.   MRN: [id number]  PROGRESS NOTE    Shannon [name]  ZDG:[id number] DOB: 22-Mar-1924 DOA: 05/22/2016  PCP: Kirt Boys, DO   Brief Narrative:  83 year old female with past medical history significant for hypertension, dyslipidemia, CVA, COPD and asthma, chronic atrial fibrillation, diastolic congestive heart failure, diabetes, hypothyroidism, gastric outlet obstruction with prior G2. Patient presented with shortness of breath ever since she left the hospital 05/17/2016.[name] Last hospitalization from 05/08/2016 through 05/17/2016 for community-acquired pneumonia and respiratory failure.  Patient was given nebulizer treatments, BiPAP but continued to be short of breath for which reason she was admitted she was given vancomycin and aztreonam for possible healthcare associated pneumonia but her chest x-ray actually revealed resolving pneumonia. Urinalysis was negative for leukocytes.  Assessment & Plan:  Acute on chronic respiratory failure with hypoxia / Acute COPD exacerbation/recurrent aspiration ( per patient  She is not dependent on 02 at baseline) - Likely combination of pneumonia as well as COPD exacerbation, aspiration - she initially required bipap, now on 4liter, still have dyspnea with talking, still have congested cough -Continue Brovana and Pulmicort nebulizer twice daily, stop solumedrol and start prednisone and taper - Continue DuoNeb nebulizer 3 times daily as well as every 2 hours as needed for shortness of breath or wheezing - Continue oxygen support via [place] to keep O2 sats above 90%, may need to set up home o2, I have explained this to the patient and her daughter   Sepsis / Healthcare associated pneumonia / aspiration pneumonia/Lactic acidosis  - Sepsis criteria met on the admission with tachypnea, hypoxia, lactic acidosis of 2.3 and 3 - Source of infection likely healthcare associated pneumonia considering patient's last  recent hospitalization within 5 days, and recurrent aspiration pneumonia -She is on J tube feeds with minimal PO intake at home,  Per SLP eval - nectar thick liquids  - Continue cefepime and vanco stopped 2/21, she remain has elevated lactic acid, mrsa screen is position, restart vanc, check chest CT - Blood cx results negative so far  - Respiratory virus panel is negative    Steroid induced hyperglycemia - Continue SSI  Persistent atrial fibrillation - CHADS vasc score 6 - Continue anticoagulation with coumadin - Rate controlled with bisoprolol and Cardizem  Chronic diastolic CHF - EF 60% based on 2 D ECHO 05/09/2016 - BNP in 200 on this admission - Continue current meds, no edema  Chronic kidney disease stage 3 - Cr on this admission 1.31, at baseline compared to recent creatinine values   Essential hypertension - bp still elevated on  Cardizem /Bisoprolol/ Hydralazine/ Imdur  Increase bisoprolol on 2/24  Hypothyroidism - Continue synthroid   Dyslipidemia - Continue statin therapy   MRSA colonization: contact precaution and decolonization    DVT prophylaxis: On Coumadin  Code Status: DNR/DNI Family Communication: patient Disposition Plan: d/c when able to wean off oxygen, may need home o2 if not able to wean   Consultants:   SLP  PT  Palliative care  Procedures:   BiPAP  Antimicrobials:   Vanco 05/22/2016 --> 05/24/2016  , restarted on 2/23  Cefepime 05/22/2016 -->   Subjective: Seems improving , still has intermittent congested cough, less dyspnea when talking, on 3liter oxygen, no edema She c/o delayed tube feeds last night  Objective: Vitals:   05/27/16 0918 05/27/16 1010 05/27/16 1126 05/27/16 1357  BP: (!) 192/58  (!) 156/53   Pulse: 78  74   Resp:  18   Temp:   98.1 F (36.7 C)   TempSrc:   Oral   SpO2: 95% 96% 99% 99%  Weight:      Height:        Intake/Output Summary (Last 24 hours) at 05/27/16 1825 Last data filed at 05/27/16  1300  Gross per 24 hour  Intake             1575 ml  Output             2375 ml  Net             -800 ml   Filed Weights   05/25/16 1618 05/26/16 0500 05/27/16 0435  Weight: 61.7 kg (136 lb 1.6 oz) 61.4 kg (135 lb 6.4 oz) 60.7 kg (133 lb 14.4 oz)    Examination:  General exam: less dyspnea when talk, aaox3  Respiratory system: remain Rhonchorous, vs upper airway sounds,  wheezing has resolved Cardiovascular system: S1 & S2 heard, Rate controlled  Gastrointestinal system: (+) BS, non tender, J tube in place  Central nervous system: No focal deficits  Extremities: No swelling, palpable pulses Skin: skin is warm and dry  Psychiatry: Normal mood and behavior, no restlessness   Data Reviewed: I have personally reviewed following labs and imaging studies  CBC:  Recent Labs Lab 05/22/16 0101 05/22/16 0117 05/24/16 0340 05/25/16 0335 05/26/16 0458 05/27/16 0443  WBC 8.3  --  18.2* 15.5* 15.3* 14.4*  NEUTROABS 4.9  --   --   --   --   --   HGB 11.4* 12.6 10.1* 10.8* 10.8* 10.9*  HCT 33.1* 37.0 30.1* 32.1* 32.4* 32.2*  MCV 83.0  --  81.8 82.9 83.1 82.8  PLT 418*  --  399 391 417* 390   Basic Metabolic Panel:  Recent Labs Lab 05/22/16 0101 05/22/16 0117 05/24/16 0340 05/25/16 0335 05/26/16 0458 05/27/16 0443  NA 131* 131* 130* 134* 135 140  K 4.5 4.4 4.3 4.4 3.9 3.5  CL 93* 96* 96* 96* 97* 101  CO2 26  --  24 26 25 27   GLUCOSE 178* 171* 398* 327* 388* 374*  BUN 47* 48* 54* 52* 54* 47*  CREATININE 1.31* 1.30* 1.23* 1.16* 1.14* 1.11*  CALCIUM 9.8  --  9.3 9.3 9.4 9.5  MG  --   --   --   --   --  2.2   GFR: Estimated Creatinine Clearance: 31.5 mL/min (by C-G formula based on SCr of 1.11 mg/dL (H)). Liver Function Tests:  Recent Labs Lab 05/22/16 0101  AST 50*  ALT 68*  ALKPHOS 87  BILITOT 0.6  PROT 7.6  ALBUMIN 3.5   No results for input(s): LIPASE, AMYLASE in the last 168 hours. No results for input(s): AMMONIA in the last 168 hours. Coagulation  Profile:  Recent Labs Lab 05/23/16 0634 05/24/16 0340 05/25/16 0335 05/26/16 0458 05/27/16 0443  INR 1.91 1.93 2.22 2.22 2.65   Cardiac Enzymes: No results for input(s): CKTOTAL, CKMB, CKMBINDEX, TROPONINI in the last 168 hours. BNP (last 3 results)  Recent Labs  05/08/16 1053  PROBNP 1,132*   HbA1C: No results for input(s): HGBA1C in the last 72 hours. CBG:  Recent Labs Lab 05/26/16 1610 05/26/16 2223 05/27/16 0740 05/27/16 1136 05/27/16 1628  GLUCAP 301* 246* 344* 237* 197*   Lipid Profile: No results for input(s): CHOL, HDL, LDLCALC, TRIG, CHOLHDL, LDLDIRECT in the last 72 hours. Thyroid Function Tests: No results for input(s): TSH, T4TOTAL, FREET4, T3FREE, THYROIDAB in  the last 72 hours. Anemia Panel: No results for input(s): VITAMINB12, FOLATE, FERRITIN, TIBC, IRON, RETICCTPCT in the last 72 hours. Urine analysis:    Component Value Date/Time   COLORURINE YELLOW 05/22/2016 0155   APPEARANCEUR HAZY (A) 05/22/2016 0155   LABSPEC 1.011 05/22/2016 0155   PHURINE 7.0 05/22/2016 0155   GLUCOSEU NEGATIVE 05/22/2016 0155   HGBUR NEGATIVE 05/22/2016 0155   BILIRUBINUR NEGATIVE 05/22/2016 0155   KETONESUR NEGATIVE 05/22/2016 0155   PROTEINUR 100 (A) 05/22/2016 0155   UROBILINOGEN 0.2 01/27/2015 2308   NITRITE NEGATIVE 05/22/2016 0155   LEUKOCYTESUR NEGATIVE 05/22/2016 0155   Sepsis Labs: @LABRCNTIP (procalcitonin:4,lacticidven:4)  Blood Culture (routine x 2)     Status: None (Preliminary result)   Collection Time: 05/22/16  1:01 AM  Result Value Ref Range Status   Specimen Description BLOOD RIGHT FOREARM  Final   Culture NO GROWTH 1 DAY  Final   Report Status PENDING  Incomplete  Urine culture     Status: None   Collection Time: 05/22/16  1:55 AM  Result Value Ref Range Status   Specimen Description URINE, CLEAN CATCH  Final   Special Requests NONE  Final   Culture NO GROWTH  Final   Report Status 05/23/2016 FINAL  Final  MRSA PCR Screening     Status:  Abnormal   Collection Time: 05/22/16  4:27 AM  Result Value Ref Range Status   MRSA by PCR POSITIVE (A) NEGATIVE Final  Blood Culture (routine x 2)     Status: None (Preliminary result)   Collection Time: 05/22/16  4:51 AM  Result Value Ref Range Status   Specimen Description BLOOD RIGHT HAND  Final   Culture NO GROWTH 1 DAY  Final   Report Status PENDING  Incomplete  Respiratory Panel by PCR     Status: None   Collection Time: 05/22/16 10:30 AM  Result Value Ref Range Status   Adenovirus NOT DETECTED NOT DETECTED Final   Coronavirus 229E NOT DETECTED NOT DETECTED Final   Coronavirus HKU1 NOT DETECTED NOT DETECTED Final   Coronavirus NL63 NOT DETECTED NOT DETECTED Final   Coronavirus OC43 NOT DETECTED NOT DETECTED Final   Metapneumovirus NOT DETECTED NOT DETECTED Final   Rhinovirus / Enterovirus NOT DETECTED NOT DETECTED Final   Influenza A NOT DETECTED NOT DETECTED Final   Influenza B NOT DETECTED NOT DETECTED Final   Parainfluenza Virus 1 NOT DETECTED NOT DETECTED Final   Parainfluenza Virus 2 NOT DETECTED NOT DETECTED Final   Parainfluenza Virus 3 NOT DETECTED NOT DETECTED Final   Parainfluenza Virus 4 NOT DETECTED NOT DETECTED Final   Respiratory Syncytial Virus NOT DETECTED NOT DETECTED Final   Bordetella pertussis NOT DETECTED NOT DETECTED Final   Chlamydophila pneumoniae NOT DETECTED NOT DETECTED Final   Mycoplasma pneumoniae NOT DETECTED NOT DETECTED Final      Radiology Studies: Dg Chest Port 1 View Result Date: 05/22/2016  Interval improvement of the left lung base opacity with near complete resolution. Small left pleural effusion with minimal left lung base atelectasis versus residual infiltrate. Clinical correlation and follow-up recommended.   Scheduled Meds: . arformoterol  15 mcg Nebulization BID  . atorvastatin  10 mg Per Tube q1800  . bisoprolol  5 mg Per Tube Daily  . budesonide   0.5 mg Nebulization BID  . ceFEPime  1 g Intravenous Q24H  . diltiazem  120  mg Oral Daily  . feeding supplement (NEPRO CARB STEADY)  237 mL Oral BID BM  . feeding  supplement OSMOLITE   65 mL/hr Per Tube Q24H  . fluticasone  2 spray Each Nare Daily  . free water  230 mL Per Tube TID  . hydrALAZINE  50 mg Per Tube TID  . ipratropium-albuter  3 mL Nebulization TID  . isosorbide mononitrate  30 mg Oral Daily  . levothyroxine  137 mcg Oral QAC breakfast  . methylPREDNISolo  60 mg Intravenous Q12H  . multivitamin with minerals  1 tablet Oral Daily  . pantoprazole (PROTONIX) IV  40 mg Intravenous Q12H  . vancomycin  1,000 mg Intravenous Q24H  . warfarin  5 mg Oral ONCE-1800   Continuous Infusions:   LOS: 5 days    Time spent: 35 minutes  Greater than 50% of the time spent on counseling and coordinating the care.   Albertine Grates, MD PhD [facility] Pager [phone]  If 7PM-7AM, please contact night-coverage [email or web address] Password [facility] 05/27/2016, 6:25 PM

## 2016-05-27 NOTE — Progress Notes (Signed)
[facility] for Warfarin  Indication: Atrial flutter  Allergies  Allergen Reactions  . Penicillins Itching, Rash and Other (See Comments)    Has patient had a PCN reaction causing immediate rash, facial/tongue/throat swelling, SOB or lightheadedness with hypotension: Yes Has patient had a PCN reaction causing severe rash involving mucus membranes or skin necrosis: Unk Has patient had a PCN reaction that required hospitalization: Unk Has patient had a PCN reaction occurring within the last 10 years: Unk If all of the above answers are "NO", then may proceed with Cephalosporin use.    Patient Measurements: Height: 5\' 6"  (167.6 cm) Weight: 133 lb 14.4 oz (60.7 kg) (c scale) IBW/kg (Calculated) : 59.3  Vital Signs: Temp: 98.1 F (36.7 C) (02/24 1126) Temp Source: Oral (02/24 1126) BP: 156/53 (02/24 1126) Pulse Rate: 74 (02/24 1126)  Labs:  Recent Labs  05/25/16 0335 05/26/16 0458 05/27/16 0443  HGB 10.8* 10.8* 10.9*  HCT 32.1* 32.4* 32.2*  PLT 391 417* 390  LABPROT 25.0* 25.0* 28.8*  INR 2.22 2.22 2.65  CREATININE 1.16* 1.14* 1.11*    Estimated Creatinine Clearance: 31.5 mL/min (by C-G formula based on SCr of 1.11 mg/dL (H)).    Assessment: 83 y/o F on warfarin PTA for aflutter, just discharged on 2/14, back to ED with worsening shortness of breath treated with ABX for possible pna. Pharmacy consulted to dose warfarin.   PTA warfarin dose = 3.75mg /day except 2.5mg  on Mon.   INR therapeutic after dose increase x 3 days (2/19-2/21) then return to home dose. Patient with poor-to-moderate PO intake, on ABX and prednisone. No bleeding noted. CBC stable.   Goal of Therapy:  INR 2-3 Monitor platelets by anticoagulation protocol: Yes   Plan:  -Continue home dose - Warfarin  3.75mg /day except 2.5mg  on Mon -Daily PT/INR -Monitor for s/sx bleeding, DDI, PO intake  Carlean Jews, Pharm.D. PGY1 Pharmacy Resident 2/24/20181:31  PM Pager [phone]

## 2016-05-28 LAB — CBC WITH DIFFERENTIAL/PLATELET
Basophils Absolute: 0 10*3/uL (ref 0.0–0.1)
Basophils Relative: 0 %
Eosinophils Absolute: 0 10*3/uL (ref 0.0–0.7)
Eosinophils Relative: 0 %
HCT: 32 % — ABNORMAL LOW (ref 36.0–46.0)
Hemoglobin: 10.7 g/dL — ABNORMAL LOW (ref 12.0–15.0)
Lymphocytes Relative: 9 %
Lymphs Abs: 1.2 10*3/uL (ref 0.7–4.0)
MCH: 27.6 pg (ref 26.0–34.0)
MCHC: 33.4 g/dL (ref 30.0–36.0)
MCV: 82.7 fL (ref 78.0–100.0)
Monocytes Absolute: 0.6 10*3/uL (ref 0.1–1.0)
Monocytes Relative: 4 %
Neutro Abs: 11.8 10*3/uL — ABNORMAL HIGH (ref 1.7–7.7)
Neutrophils Relative %: 87 %
Platelets: 365 10*3/uL (ref 150–400)
RBC: 3.87 MIL/uL (ref 3.87–5.11)
RDW: 19.8 % — ABNORMAL HIGH (ref 11.5–15.5)
WBC: 13.6 10*3/uL — ABNORMAL HIGH (ref 4.0–10.5)

## 2016-05-28 LAB — COMPREHENSIVE METABOLIC PANEL
ALT: 52 U/L (ref 14–54)
AST: 32 U/L (ref 15–41)
Albumin: 2.8 g/dL — ABNORMAL LOW (ref 3.5–5.0)
Alkaline Phosphatase: 72 U/L (ref 38–126)
Anion gap: 10 (ref 5–15)
BUN: 45 mg/dL — ABNORMAL HIGH (ref 6–20)
CO2: 25 mmol/L (ref 22–32)
Calcium: 9.1 mg/dL (ref 8.9–10.3)
Chloride: 100 mmol/L — ABNORMAL LOW (ref 101–111)
Creatinine, Ser: 1.11 mg/dL — ABNORMAL HIGH (ref 0.44–1.00)
GFR calc Af Amer: 49 mL/min — ABNORMAL LOW (ref >60)
GFR calc non Af Amer: 42 mL/min — ABNORMAL LOW (ref >60)
Glucose, Bld: 345 mg/dL — ABNORMAL HIGH (ref 65–99)
Potassium: 3.8 mmol/L (ref 3.5–5.1)
Sodium: 135 mmol/L (ref 135–145)
Total Bilirubin: 0.3 mg/dL (ref 0.3–1.2)
Total Protein: 6.1 g/dL — ABNORMAL LOW (ref 6.5–8.1)

## 2016-05-28 LAB — LACTIC ACID, PLASMA: Lactic Acid, Venous: 2.5 mmol/L (ref 0.5–1.9)

## 2016-05-28 LAB — PROTIME-INR
INR: 2.64
Prothrombin Time: 28.7 seconds — ABNORMAL HIGH (ref 11.4–15.2)

## 2016-05-28 LAB — GLUCOSE, CAPILLARY
Glucose-Capillary: 299 mg/dL — ABNORMAL HIGH (ref 65–99)
Glucose-Capillary: 108 mg/dL — ABNORMAL HIGH (ref 65–99)
Glucose-Capillary: 295 mg/dL — ABNORMAL HIGH (ref 65–99)
Glucose-Capillary: 266 mg/dL — ABNORMAL HIGH (ref 65–99)

## 2016-05-28 LAB — MAGNESIUM: Magnesium: 2 mg/dL (ref 1.7–2.4)

## 2016-05-28 MED ORDER — PREDNISONE 20 MG PO TABS
30.0000 mg | ORAL_TABLET | Freq: Every day | ORAL | Status: DC
  Administered 2016-05-29 – 2016-05-30 (×2): 30 mg via ORAL
  Filled 2016-05-28 (×2): qty 1

## 2016-05-28 NOTE — Progress Notes (Signed)
Patient ID: Shannon [name], female   DOB: 1924/02/13, 83 y.o.   MRN: [id number]  PROGRESS NOTE    Shannon [name]  LKG:[id number] DOB: [birth year] DOA: 05/22/2016  PCP: Kirt Boys, DO   Brief Narrative:  83 year old female with past medical history significant for hypertension, dyslipidemia, CVA, COPD and asthma, chronic atrial fibrillation, diastolic congestive heart failure, diabetes, hypothyroidism, gastric outlet obstruction with prior G2. Patient presented with shortness of breath ever since she left the hospital 05/17/2016.[name] Last hospitalization from 05/08/2016 through 05/17/2016 for community-acquired pneumonia and respiratory failure.  Patient was given nebulizer treatments, BiPAP but continued to be short of breath for which reason she was admitted she was given vancomycin and aztreonam for possible healthcare associated pneumonia but her chest x-ray actually revealed resolving pneumonia. Urinalysis was negative for leukocytes.  Assessment & Plan:  Acute on chronic respiratory failure with hypoxia / Acute COPD exacerbation/recurrent aspiration ( per patient  She is not dependent on 02 at baseline) - Likely combination of pneumonia as well as COPD exacerbation, aspiration - she initially required bipap, now on 4liter, still have dyspnea with talking, still have congested cough -Continue Brovana and Pulmicort nebulizer twice daily, stop solumedrol and start prednisone and taper - Continue DuoNeb nebulizer 3 times daily as well as every 2 hours as needed for shortness of breath or wheezing - Continue oxygen support via [place] to keep O2 sats above 90%, may need to set up home o2, I have explained this to the patient and her daughter   Sepsis / Healthcare associated pneumonia / aspiration pneumonia/Lactic acidosis  - Sepsis criteria met on the admission with tachypnea, hypoxia, lactic acidosis of 2.3 and 3 - Source of infection likely healthcare associated pneumonia considering patient's last  recent hospitalization within 5 days, and recurrent aspiration pneumonia -She is on J tube feeds with minimal PO intake at home,  Per SLP eval - nectar thick liquids  -  Blood cx results negative so far  - Respiratory virus panel is negative  -Continue cefepime and vanco stopped 2/21, she remain has elevated lactic acid, mrsa screen is position, restart vanc,  chest CT "Left upper lobe collapse without identifiable cause on this study. Broncholiths within the collapsed left upper lobe identified.Nonspecific bilateral central ground-glass opacities -question edema, infection or nonspecific inflammation." Start chest PT, I have talked to pulmonology Dr Sherene Sires who state pulm will see on 2/26  Steroid induced hyperglycemia - Continue SSI  Persistent atrial fibrillation - CHADS vasc score 6 - Continue anticoagulation with coumadin - Rate controlled with bisoprolol and Cardizem  Chronic diastolic CHF - EF 60% based on 2 D ECHO 05/09/2016 - BNP in 200 on this admission - Continue current meds, no edema  Chronic kidney disease stage 3 - Cr on this admission 1.31, at baseline compared to recent creatinine values   Essential hypertension - bp still elevated on  Cardizem /Bisoprolol/ Hydralazine/ Imdur  Increase bisoprolol on 2/24  Hypothyroidism - Continue synthroid   Dyslipidemia - Continue statin therapy   MRSA colonization: contact precaution and decolonization    DVT prophylaxis: On Coumadin  Code Status: DNR/DNI Family Communication: patient Disposition Plan: d/c when able to wean off oxygen, may need home o2 if not able to wean   Consultants:   SLP  PT  Palliative care  pulmonology  Procedures:   BiPAP  Antimicrobials:   Vanco 05/22/2016 --> 05/24/2016  , restarted on 2/23  Cefepime 05/22/2016 -->   Subjective: Seems improving , still has intermittent congested cough,  less dyspnea when talking, on 3liter oxygen, no edema   Objective: Vitals:   05/28/16 0305  05/28/16 1245 05/28/16 1355 05/28/16 1529  BP: (!) 170/75 (!) 147/68 135/73   Pulse: 96 68 80   Resp: 20 18 18    Temp: 97.9 F (36.6 C) 98 F (36.7 C) 98 F (36.7 C)   TempSrc: Oral Oral Oral   SpO2: 97% 98% 100% 100%  Weight: 61.7 kg (136 lb 1.6 oz)     Height:        Intake/Output Summary (Last 24 hours) at 05/28/16 1737 Last data filed at 05/28/16 1300  Gross per 24 hour  Intake              960 ml  Output             1153 ml  Net             -193 ml   Filed Weights   05/26/16 0500 05/27/16 0435 05/28/16 0305  Weight: 61.4 kg (135 lb 6.4 oz) 60.7 kg (133 lb 14.4 oz) 61.7 kg (136 lb 1.6 oz)    Examination:  General exam: less dyspnea when talk, aaox3  Respiratory system: remain Rhonchorous, vs upper airway sounds,  wheezing has resolved Cardiovascular system: S1 & S2 heard, Rate controlled  Gastrointestinal system: (+) BS, non tender, J tube in place  Central nervous system: No focal deficits  Extremities: No swelling, palpable pulses Skin: skin is warm and dry  Psychiatry: Normal mood and behavior, no restlessness   Data Reviewed: I have personally reviewed following labs and imaging studies  CBC:  Recent Labs Lab 05/22/16 0101  05/24/16 0340 05/25/16 0335 05/26/16 0458 05/27/16 0443 05/28/16 0519  WBC 8.3  --  18.2* 15.5* 15.3* 14.4* 13.6*  NEUTROABS 4.9  --   --   --   --   --  11.8*  HGB 11.4*  < > 10.1* 10.8* 10.8* 10.9* 10.7*  HCT 33.1*  < > 30.1* 32.1* 32.4* 32.2* 32.0*  MCV 83.0  --  81.8 82.9 83.1 82.8 82.7  PLT 418*  --  399 391 417* 390 365  < > = values in this interval not displayed. Basic Metabolic Panel:  Recent Labs Lab 05/24/16 0340 05/25/16 0335 05/26/16 0458 05/27/16 0443 05/28/16 0519  NA 130* 134* 135 140 135  K 4.3 4.4 3.9 3.5 3.8  CL 96* 96* 97* 101 100*  CO2 24 26 25 27 25   GLUCOSE 398* 327* 388* 374* 345*  BUN 54* 52* 54* 47* 45*  CREATININE 1.23* 1.16* 1.14* 1.11* 1.11*  CALCIUM 9.3 9.3 9.4 9.5 9.1  MG  --   --   --   2.2 2.0   GFR: Estimated Creatinine Clearance: 31.5 mL/min (by C-G formula based on SCr of 1.11 mg/dL (H)). Liver Function Tests:  Recent Labs Lab 05/22/16 0101 05/28/16 0519  AST 50* 32  ALT 68* 52  ALKPHOS 87 72  BILITOT 0.6 0.3  PROT 7.6 6.1*  ALBUMIN 3.5 2.8*   No results for input(s): LIPASE, AMYLASE in the last 168 hours. No results for input(s): AMMONIA in the last 168 hours. Coagulation Profile:  Recent Labs Lab 05/24/16 0340 05/25/16 0335 05/26/16 0458 05/27/16 0443 05/28/16 0519  INR 1.93 2.22 2.22 2.65 2.64   Cardiac Enzymes: No results for input(s): CKTOTAL, CKMB, CKMBINDEX, TROPONINI in the last 168 hours. BNP (last 3 results)  Recent Labs  05/08/16 1053  PROBNP 1,132*   HbA1C: No results for  input(s): HGBA1C in the last 72 hours. CBG:  Recent Labs Lab 05/27/16 1628 05/27/16 2144 05/28/16 0721 05/28/16 1239 05/28/16 1623  GLUCAP 197* 282* 299* 108* 295*   Lipid Profile: No results for input(s): CHOL, HDL, LDLCALC, TRIG, CHOLHDL, LDLDIRECT in the last 72 hours. Thyroid Function Tests: No results for input(s): TSH, T4TOTAL, FREET4, T3FREE, THYROIDAB in the last 72 hours. Anemia Panel: No results for input(s): VITAMINB12, FOLATE, FERRITIN, TIBC, IRON, RETICCTPCT in the last 72 hours. Urine analysis:    Component Value Date/Time   COLORURINE YELLOW 05/22/2016 0155   APPEARANCEUR HAZY (A) 05/22/2016 0155   LABSPEC 1.011 05/22/2016 0155   PHURINE 7.0 05/22/2016 0155   GLUCOSEU NEGATIVE 05/22/2016 0155   HGBUR NEGATIVE 05/22/2016 0155   BILIRUBINUR NEGATIVE 05/22/2016 0155   KETONESUR NEGATIVE 05/22/2016 0155   PROTEINUR 100 (A) 05/22/2016 0155   UROBILINOGEN 0.2 01/27/2015 2308   NITRITE NEGATIVE 05/22/2016 0155   LEUKOCYTESUR NEGATIVE 05/22/2016 0155   Sepsis Labs: @LABRCNTIP (procalcitonin:4,lacticidven:4)  Blood Culture (routine x 2)     Status: None (Preliminary result)   Collection Time: 05/22/16  1:01 AM  Result Value Ref  Range Status   Specimen Description BLOOD RIGHT FOREARM  Final   Culture NO GROWTH 1 DAY  Final   Report Status PENDING  Incomplete  Urine culture     Status: None   Collection Time: 05/22/16  1:55 AM  Result Value Ref Range Status   Specimen Description URINE, CLEAN CATCH  Final   Special Requests NONE  Final   Culture NO GROWTH  Final   Report Status 05/23/2016 FINAL  Final  MRSA PCR Screening     Status: Abnormal   Collection Time: 05/22/16  4:27 AM  Result Value Ref Range Status   MRSA by PCR POSITIVE (A) NEGATIVE Final  Blood Culture (routine x 2)     Status: None (Preliminary result)   Collection Time: 05/22/16  4:51 AM  Result Value Ref Range Status   Specimen Description BLOOD RIGHT HAND  Final   Culture NO GROWTH 1 DAY  Final   Report Status PENDING  Incomplete  Respiratory Panel by PCR     Status: None   Collection Time: 05/22/16 10:30 AM  Result Value Ref Range Status   Adenovirus NOT DETECTED NOT DETECTED Final   Coronavirus 229E NOT DETECTED NOT DETECTED Final   Coronavirus HKU1 NOT DETECTED NOT DETECTED Final   Coronavirus NL63 NOT DETECTED NOT DETECTED Final   Coronavirus OC43 NOT DETECTED NOT DETECTED Final   Metapneumovirus NOT DETECTED NOT DETECTED Final   Rhinovirus / Enterovirus NOT DETECTED NOT DETECTED Final   Influenza A NOT DETECTED NOT DETECTED Final   Influenza B NOT DETECTED NOT DETECTED Final   Parainfluenza Virus 1 NOT DETECTED NOT DETECTED Final   Parainfluenza Virus 2 NOT DETECTED NOT DETECTED Final   Parainfluenza Virus 3 NOT DETECTED NOT DETECTED Final   Parainfluenza Virus 4 NOT DETECTED NOT DETECTED Final   Respiratory Syncytial Virus NOT DETECTED NOT DETECTED Final   Bordetella pertussis NOT DETECTED NOT DETECTED Final   Chlamydophila pneumoniae NOT DETECTED NOT DETECTED Final   Mycoplasma pneumoniae NOT DETECTED NOT DETECTED Final      Radiology Studies: Dg Chest Port 1 View Result Date: 05/22/2016  Interval improvement of the left  lung base opacity with near complete resolution. Small left pleural effusion with minimal left lung base atelectasis versus residual infiltrate. Clinical correlation and follow-up recommended.   Scheduled Meds: . arformoterol  15 mcg  Nebulization BID  . atorvastatin  10 mg Per Tube q1800  . bisoprolol  5 mg Per Tube Daily  . budesonide   0.5 mg Nebulization BID  . ceFEPime  1 g Intravenous Q24H  . diltiazem  120 mg Oral Daily  . feeding supplement (NEPRO CARB STEADY)  237 mL Oral BID BM  . feeding supplement OSMOLITE   65 mL/hr Per Tube Q24H  . fluticasone  2 spray Each Nare Daily  . free water  230 mL Per Tube TID  . hydrALAZINE  50 mg Per Tube TID  . ipratropium-albuter  3 mL Nebulization TID  . isosorbide mononitrate  30 mg Oral Daily  . levothyroxine  137 mcg Oral QAC breakfast  . methylPREDNISolo  60 mg Intravenous Q12H  . multivitamin with minerals  1 tablet Oral Daily  . pantoprazole (PROTONIX) IV  40 mg Intravenous Q12H  . vancomycin  1,000 mg Intravenous Q24H  . warfarin  5 mg Oral ONCE-1800   Continuous Infusions:   LOS: 6 days    Time spent: 35 minutes  Greater than 50% of the time spent on counseling and coordinating the care.   Albertine Grates, MD PhD [facility] Pager [phone]  If 7PM-7AM, please contact night-coverage [email or web address] Password [facility] 05/28/2016, 5:37 PM

## 2016-05-28 NOTE — Progress Notes (Signed)
[place] for Warfarin  Indication: Atrial flutter  Allergies  Allergen Reactions  . Penicillins Itching, Rash and Other (See Comments)    Has patient had a PCN reaction causing immediate rash, facial/tongue/throat swelling, SOB or lightheadedness with hypotension: Yes Has patient had a PCN reaction causing severe rash involving mucus membranes or skin necrosis: Unk Has patient had a PCN reaction that required hospitalization: Unk Has patient had a PCN reaction occurring within the last 10 years: Unk If all of the above answers are "NO", then may proceed with Cephalosporin use.    Patient Measurements: Height: 5\' 6"  (167.6 cm) Weight: 136 lb 1.6 oz (61.7 kg) IBW/kg (Calculated) : 59.3  Vital Signs: Temp: 98 F (36.7 C) (02/25 1245) Temp Source: Oral (02/25 1245) BP: 147/68 (02/25 1245) Pulse Rate: 68 (02/25 1245)  Labs:  Recent Labs  05/26/16 0458 05/27/16 0443 05/28/16 0519  HGB 10.8* 10.9* 10.7*  HCT 32.4* 32.2* 32.0*  PLT 417* 390 365  LABPROT 25.0* 28.8* 28.7*  INR 2.22 2.65 2.64  CREATININE 1.14* 1.11* 1.11*    Estimated Creatinine Clearance: 31.5 mL/min (by C-G formula based on SCr of 1.11 mg/dL (H)).    Assessment: 83 y/o F on warfarin PTA for aflutter, just discharged on 2/14, back to ED with worsening shortness of breath treated with ABX for possible pna. Pharmacy consulted to dose warfarin.   PTA warfarin dose = 3.75mg /day except 2.5mg  on Mon.   INR therapeutic after dose increase x 3 days (2/19-2/21) then return to home dose. Patient with poor-to-moderate PO intake, on ABX and prednisone. No bleeding noted. CBC stable.   Goal of Therapy:  INR 2-3 Monitor platelets by anticoagulation protocol: Yes   Plan:  -Continue home dose - Warfarin  3.75mg /day except 2.5mg  on Mon -Daily PT/INR -Monitor for s/sx bleeding, DDI, PO intake  Carlean Jews, Pharm.D. PGY1 Pharmacy Resident 2/25/201812:51 PM Pager  [phone]

## 2016-05-29 ENCOUNTER — Ambulatory Visit: Payer: Medicare Other | Admitting: Physician Assistant

## 2016-05-29 ENCOUNTER — Inpatient Hospital Stay (HOSPITAL_COMMUNITY): Payer: Medicare Other

## 2016-05-29 DIAGNOSIS — R0689 Other abnormalities of breathing: Secondary | ICD-10-CM

## 2016-05-29 DIAGNOSIS — J690 Pneumonitis due to inhalation of food and vomit: Secondary | ICD-10-CM

## 2016-05-29 DIAGNOSIS — J9601 Acute respiratory failure with hypoxia: Secondary | ICD-10-CM

## 2016-05-29 DIAGNOSIS — J9811 Atelectasis: Secondary | ICD-10-CM

## 2016-05-29 LAB — GLUCOSE, CAPILLARY
Glucose-Capillary: 285 mg/dL — ABNORMAL HIGH (ref 65–99)
Glucose-Capillary: 106 mg/dL — ABNORMAL HIGH (ref 65–99)
Glucose-Capillary: 270 mg/dL — ABNORMAL HIGH (ref 65–99)
Glucose-Capillary: 247 mg/dL — ABNORMAL HIGH (ref 65–99)

## 2016-05-29 LAB — BASIC METABOLIC PANEL
Anion gap: 7 (ref 5–15)
BUN: 43 mg/dL — ABNORMAL HIGH (ref 6–20)
CO2: 29 mmol/L (ref 22–32)
Calcium: 8.8 mg/dL — ABNORMAL LOW (ref 8.9–10.3)
Chloride: 100 mmol/L — ABNORMAL LOW (ref 101–111)
Creatinine, Ser: 1.08 mg/dL — ABNORMAL HIGH (ref 0.44–1.00)
GFR calc Af Amer: 51 mL/min — ABNORMAL LOW (ref >60)
GFR calc non Af Amer: 44 mL/min — ABNORMAL LOW (ref >60)
Glucose, Bld: 304 mg/dL — ABNORMAL HIGH (ref 65–99)
Potassium: 3.9 mmol/L (ref 3.5–5.1)
Sodium: 136 mmol/L (ref 135–145)

## 2016-05-29 LAB — CBC
HCT: 31 % — ABNORMAL LOW (ref 36.0–46.0)
Hemoglobin: 10.2 g/dL — ABNORMAL LOW (ref 12.0–15.0)
MCH: 27.3 pg (ref 26.0–34.0)
MCHC: 32.9 g/dL (ref 30.0–36.0)
MCV: 82.9 fL (ref 78.0–100.0)
Platelets: 346 10*3/uL (ref 150–400)
RBC: 3.74 MIL/uL — ABNORMAL LOW (ref 3.87–5.11)
RDW: 19.6 % — ABNORMAL HIGH (ref 11.5–15.5)
WBC: 12.8 10*3/uL — ABNORMAL HIGH (ref 4.0–10.5)

## 2016-05-29 LAB — PROTIME-INR
INR: 2.95
Prothrombin Time: 31.4 seconds — ABNORMAL HIGH (ref 11.4–15.2)

## 2016-05-29 LAB — VANCOMYCIN, TROUGH: Vancomycin Tr: 15 ug/mL (ref 15–20)

## 2016-05-29 MED ORDER — WARFARIN SODIUM 2.5 MG PO TABS
2.5000 mg | ORAL_TABLET | Freq: Once | ORAL | Status: AC
  Administered 2016-05-29: 2.5 mg via ORAL
  Filled 2016-05-29: qty 1

## 2016-05-29 MED ORDER — VANCOMYCIN HCL 10 G IV SOLR
1250.0000 mg | INTRAVENOUS | Status: DC
  Administered 2016-05-29: 1250 mg via INTRAVENOUS
  Filled 2016-05-29 (×2): qty 1250

## 2016-05-29 MED ORDER — IPRATROPIUM-ALBUTEROL 0.5-2.5 (3) MG/3ML IN SOLN
3.0000 mL | Freq: Two times a day (BID) | RESPIRATORY_TRACT | Status: DC
  Administered 2016-05-29 – 2016-05-30 (×3): 3 mL via RESPIRATORY_TRACT
  Filled 2016-05-29 (×3): qty 3

## 2016-05-29 MED ORDER — FUROSEMIDE 40 MG PO TABS
40.0000 mg | ORAL_TABLET | Freq: Every day | ORAL | Status: DC
  Administered 2016-05-29 – 2016-05-30 (×2): 40 mg via ORAL
  Filled 2016-05-29 (×2): qty 1

## 2016-05-29 NOTE — Progress Notes (Signed)
Physical Therapy Treatment Patient Details Name: Shannon [name] MRN: [id number] DOB: [birth year] Today's Date: 05/29/2016    History of Present Illness 83 year old female with past medical history significant for hypertension, dyslipidemia, CVA, COPD and asthma, chronic atrial fibrillation, diastolic congestive heart failure, diabetes, hypothyroidism, gastric outlet obstruction with prior Gtube. Patient presented with shortness of breath ever since she left the hospital 05/17/2016. Last hospitalization from 05/08/2016 through 05/17/2016 for community-acquired pneumonia and respiratory failure    PT Comments    Pt able to ambulate in hallway on room air with o2 sat at 92-94%. Con't to recommend HHPT for home safety assessment.   Follow Up Recommendations  Home health PT     Equipment Recommendations  None recommended by PT    Recommendations for Other Services       Precautions / Restrictions Precautions Precautions: Fall Restrictions Weight Bearing Restrictions: No    Mobility  Bed Mobility               General bed mobility comments: pt up in recliner upon arrival  Transfers     Transfers: Sit to/from Stand Sit to Stand: Min guard;Min assist         General transfer comment: min/guard from recliner and MIN A from low toilet with rail A  Ambulation/Gait Ambulation/Gait assistance: Min guard;Supervision Ambulation Distance (Feet): 240 Feet Assistive device: Rolling walker (2 wheeled) Gait Pattern/deviations: Step-through pattern;Trunk flexed Gait velocity: decreased   General Gait Details: o2 between 92-94% on room air with gait with no rest breaks.  Pt sounded SOB, but denies feeling SOB. overall steady with RW   Stairs            Wheelchair Mobility    Modified Rankin (Stroke Patients Only)       Balance                                    Cognition Arousal/Alertness: Awake/alert Behavior During Therapy: WFL for tasks  assessed/performed Overall Cognitive Status: Within Functional Limits for tasks assessed                      Exercises      General Comments        Pertinent Vitals/Pain Pain Assessment: No/denies pain    Home Living                      Prior Function            PT Goals (current goals can now be found in the care plan section) Acute Rehab PT Goals Patient Stated Goal: to get better PT Goal Formulation: With patient Time For Goal Achievement: 06/07/16 Potential to Achieve Goals: Good Progress towards PT goals: Progressing toward goals    Frequency    Min 3X/week      PT Plan Current plan remains appropriate    Co-evaluation             End of Session Equipment Utilized During Treatment: Gait belt Activity Tolerance: Patient tolerated treatment well Patient left: in chair;with call bell/phone within reach;with chair alarm set Nurse Communication: Mobility status;Other (comment) (o2 sats) PT Visit Diagnosis: Difficulty in walking, not elsewhere classified (R26.2)     Time: 3235-5732 PT Time Calculation (min) (ACUTE ONLY): 25 min  Charges:  $Gait Training: 8-22 mins $Therapeutic Activity: 8-22 mins  G Codes:       Jude Naclerio LUBECK 05/29/2016, 10:30 AM

## 2016-05-29 NOTE — Care Management Note (Signed)
Case Management Note  Patient Details  Name: Shannon [name] MRN: [id number] Date of Birth: [birth year]  Subjective/Objective:                    Action/Plan:   Expected Discharge Date:                  Expected Discharge Plan:  [place]  In-House Referral:  Clinical Social Work  Discharge planning Services  CM Consult  Post Acute Care Choice:  Resumption of Svcs/PTA Provider Choice offered to:     DME Arranged:    DME Agency:     HH Arranged:  RN, PT, OT, Nurse's Aide [place] Agency:  [facility]  Status of Service:  Completed, signed off  If discussed at [place] of Stay Meetings, dates discussed:    Additional Comments:  Marilu Favre, RN 05/29/2016, 11:23 AM

## 2016-05-29 NOTE — Progress Notes (Signed)
Patient ID: Shannon [name], female   DOB: [birth year], 83 y.o.   MRN: [id number]  PROGRESS NOTE    Shannon [name]  GLO:[id number] DOB: Feb 10, 1924 DOA: 05/22/2016  PCP: Kirt Boys, DO   Brief Narrative:  83 year old female with past medical history significant for hypertension, dyslipidemia, CVA, COPD and asthma, chronic atrial fibrillation, diastolic congestive heart failure, diabetes, hypothyroidism, gastric outlet obstruction with prior G2. Patient presented with shortness of breath ever since she left the hospital 05/17/2016.[name] Last hospitalization from 05/08/2016 through 05/17/2016 for community-acquired pneumonia and respiratory failure.  Patient was given nebulizer treatments, BiPAP but continued to be short of breath for which reason she was admitted she was given vancomycin and aztreonam for possible healthcare associated pneumonia but her chest x-ray actually revealed resolving pneumonia. Urinalysis was negative for leukocytes.  Assessment & Plan:  Acute on chronic respiratory failure with hypoxia / Acute COPD exacerbation/recurrent aspiration ( per patient  She is not dependent on 02 at baseline) - Likely combination of recurrent aspiration pneumonia /health care associated pna (mrsa screen +), sputum not collected - she initially required bipap, improving, now off oxygen -continue abx/nabs taper steroids   Sepsis / Healthcare associated pneumonia / aspiration pneumonia/Lactic acidosis /left upper lobe collapse - Sepsis criteria met on the admission with tachypnea, hypoxia, lactic acidosis of 2.3 and 3 - Source of infection likely healthcare associated pneumonia considering patient's last recent hospitalization within 5 days, and recurrent aspiration pneumonia -She is on J tube feeds with minimal PO intake at home,  Per SLP eval - nectar thick liquids  -  Blood cx , urine culture no growth,  Respiratory virus panel is negative  -Continue cefepime and vanco stopped 2/21, restart vanc  restarted due to persistent elevated lactic acid, and mrsa screen is position,  - chest CT "Left upper lobe collapse without identifiable cause on this study. Broncholiths within the collapsed left upper lobe identified.Nonspecific bilateral central ground-glass opacities -question edema, infection or nonspecific inflammation." -Start chest PT, consult pccm.  Steroid induced hyperglycemia - Continue SSI, taper steroids  Persistent atrial fibrillation - CHADS vasc score 6 - Continue anticoagulation with coumadin - Rate controlled with bisoprolol and Cardizem  Chronic diastolic CHF - EF 60% based on 2 D ECHO 05/09/2016 - BNP in 200 on this admission - Continue current meds, no edema  Chronic kidney disease stage 3 - Cr on this admission 1.31, at baseline compared to recent creatinine values   Essential hypertension - bp still elevated on  Cardizem /Bisoprolol/ Hydralazine/ Imdur  bp better controlled with Increase bisoprolol on 2/24 and taper steroids  Hypothyroidism - Continue synthroid   Dyslipidemia - Continue statin therapy   Gastric outlet obstruction, s/p j tube placement, on tube feeds, still have to eat by mouth, on dysphagia diet  MRSA colonization: contact precaution and decolonization  FTT: palliative care consulted, family does not want to consider comfort measures,  Will need home health  DVT prophylaxis: On Coumadin  Code Status: DNR/DNI Family Communication: patient and daughter Disposition Plan: d/c home with home health , likely on 2/27   Consultants:   SLP  PT  Palliative care  pulmonology  Procedures:   BiPAP  Antimicrobials:   Vanco 05/22/2016 --> 05/24/2016  , restarted on 2/23  Cefepime 05/22/2016 -->   Subjective: improving , still has intermittent congested cough, less dyspnea when talking, now off oxygen, no edema   Objective: Vitals:   05/29/16 0614 05/29/16 0900 05/29/16 0942 05/29/16 1138  BP: (!) 145/55 (!) 170/56  [name])  143/52  Pulse: 71 62  67  Resp: 18   20  Temp: 98.1 F (36.7 C)   98.2 F (36.8 C)  TempSrc: Oral   Oral  SpO2: 98%  100% 98%  Weight: 61.7 kg (136 lb 1.6 oz)     Height:        Intake/Output Summary (Last 24 hours) at 05/29/16 1243 Last data filed at 05/29/16 1138  Gross per 24 hour  Intake              600 ml  Output             1125 ml  Net             -525 ml   Filed Weights   05/27/16 0435 05/28/16 0305 05/29/16 0614  Weight: 60.7 kg (133 lb 14.4 oz) 61.7 kg (136 lb 1.6 oz) 61.7 kg (136 lb 1.6 oz)    Examination:  General exam: less dyspnea when talk, aaox3  Respiratory system: remain Rhonchorous, vs upper airway sounds,  wheezing has resolved Cardiovascular system: S1 & S2 heard, Rate controlled  Gastrointestinal system: (+) BS, non tender, J tube in place  Central nervous system: No focal deficits  Extremities: No swelling, palpable pulses Skin: skin is warm and dry  Psychiatry: Normal mood and behavior, no restlessness   Data Reviewed: I have personally reviewed following labs and imaging studies  CBC:  Recent Labs Lab 05/25/16 0335 05/26/16 0458 05/27/16 0443 05/28/16 0519 05/29/16 0527  WBC 15.5* 15.3* 14.4* 13.6* 12.8*  NEUTROABS  --   --   --  11.8*  --   HGB 10.8* 10.8* 10.9* 10.7* 10.2*  HCT 32.1* 32.4* 32.2* 32.0* 31.0*  MCV 82.9 83.1 82.8 82.7 82.9  PLT 391 417* 390 365 346   Basic Metabolic Panel:  Recent Labs Lab 05/25/16 0335 05/26/16 0458 05/27/16 0443 05/28/16 0519 05/29/16 0527  NA 134* 135 140 135 136  K 4.4 3.9 3.5 3.8 3.9  CL 96* 97* 101 100* 100*  CO2 26 25 27 25 29   GLUCOSE 327* 388* 374* 345* 304*  BUN 52* 54* 47* 45* 43*  CREATININE 1.16* 1.14* 1.11* 1.11* 1.08*  CALCIUM 9.3 9.4 9.5 9.1 8.8*  MG  --   --  2.2 2.0  --    GFR: Estimated Creatinine Clearance: 32.4 mL/min (by C-G formula based on SCr of 1.08 mg/dL (H)). Liver Function Tests:  Recent Labs Lab 05/28/16 0519  AST 32  ALT 52  ALKPHOS 72  BILITOT  0.3  PROT 6.1*  ALBUMIN 2.8*   No results for input(s): LIPASE, AMYLASE in the last 168 hours. No results for input(s): AMMONIA in the last 168 hours. Coagulation Profile:  Recent Labs Lab 05/25/16 0335 05/26/16 0458 05/27/16 0443 05/28/16 0519 05/29/16 0527  INR 2.22 2.22 2.65 2.64 2.95   Cardiac Enzymes: No results for input(s): CKTOTAL, CKMB, CKMBINDEX, TROPONINI in the last 168 hours. BNP (last 3 results)  Recent Labs  05/08/16 1053  PROBNP 1,132*   HbA1C: No results for input(s): HGBA1C in the last 72 hours. CBG:  Recent Labs Lab 05/28/16 1239 05/28/16 1623 05/28/16 2206 05/29/16 0741 05/29/16 1135  GLUCAP 108* 295* 266* 285* 106*   Lipid Profile: No results for input(s): CHOL, HDL, LDLCALC, TRIG, CHOLHDL, LDLDIRECT in the last 72 hours. Thyroid Function Tests: No results for input(s): TSH, T4TOTAL, FREET4, T3FREE, THYROIDAB in the last 72 hours. Anemia Panel: No results for input(s): VITAMINB12, FOLATE, FERRITIN, TIBC, IRON, RETICCTPCT  in the last 72 hours. Urine analysis:    Component Value Date/Time   COLORURINE YELLOW 05/22/2016 0155   APPEARANCEUR HAZY (A) 05/22/2016 0155   LABSPEC 1.011 05/22/2016 0155   PHURINE 7.0 05/22/2016 0155   GLUCOSEU NEGATIVE 05/22/2016 0155   HGBUR NEGATIVE 05/22/2016 0155   BILIRUBINUR NEGATIVE 05/22/2016 0155   KETONESUR NEGATIVE 05/22/2016 0155   PROTEINUR 100 (A) 05/22/2016 0155   UROBILINOGEN 0.2 01/27/2015 2308   NITRITE NEGATIVE 05/22/2016 0155   LEUKOCYTESUR NEGATIVE 05/22/2016 0155   Sepsis Labs: @LABRCNTIP (procalcitonin:4,lacticidven:4)  Blood Culture (routine x 2)     Status: None (Preliminary result)   Collection Time: 05/22/16  1:01 AM  Result Value Ref Range Status   Specimen Description BLOOD RIGHT FOREARM  Final   Culture NO GROWTH 1 DAY  Final   Report Status PENDING  Incomplete  Urine culture     Status: None   Collection Time: 05/22/16  1:55 AM  Result Value Ref Range Status   Specimen  Description URINE, CLEAN CATCH  Final   Special Requests NONE  Final   Culture NO GROWTH  Final   Report Status 05/23/2016 FINAL  Final  MRSA PCR Screening     Status: Abnormal   Collection Time: 05/22/16  4:27 AM  Result Value Ref Range Status   MRSA by PCR POSITIVE (A) NEGATIVE Final  Blood Culture (routine x 2)     Status: None (Preliminary result)   Collection Time: 05/22/16  4:51 AM  Result Value Ref Range Status   Specimen Description BLOOD RIGHT HAND  Final   Culture NO GROWTH 1 DAY  Final   Report Status PENDING  Incomplete  Respiratory Panel by PCR     Status: None   Collection Time: 05/22/16 10:30 AM  Result Value Ref Range Status   Adenovirus NOT DETECTED NOT DETECTED Final   Coronavirus 229E NOT DETECTED NOT DETECTED Final   Coronavirus HKU1 NOT DETECTED NOT DETECTED Final   Coronavirus NL63 NOT DETECTED NOT DETECTED Final   Coronavirus OC43 NOT DETECTED NOT DETECTED Final   Metapneumovirus NOT DETECTED NOT DETECTED Final   Rhinovirus / Enterovirus NOT DETECTED NOT DETECTED Final   Influenza A NOT DETECTED NOT DETECTED Final   Influenza B NOT DETECTED NOT DETECTED Final   Parainfluenza Virus 1 NOT DETECTED NOT DETECTED Final   Parainfluenza Virus 2 NOT DETECTED NOT DETECTED Final   Parainfluenza Virus 3 NOT DETECTED NOT DETECTED Final   Parainfluenza Virus 4 NOT DETECTED NOT DETECTED Final   Respiratory Syncytial Virus NOT DETECTED NOT DETECTED Final   Bordetella pertussis NOT DETECTED NOT DETECTED Final   Chlamydophila pneumoniae NOT DETECTED NOT DETECTED Final   Mycoplasma pneumoniae NOT DETECTED NOT DETECTED Final      Radiology Studies: Dg Chest Port 1 View Result Date: 05/22/2016  Interval improvement of the left lung base opacity with near complete resolution. Small left pleural effusion with minimal left lung base atelectasis versus residual infiltrate. Clinical correlation and follow-up recommended.   Scheduled Meds: . arformoterol  15 mcg Nebulization  BID  . atorvastatin  10 mg Per Tube q1800  . bisoprolol  5 mg Per Tube Daily  . budesonide   0.5 mg Nebulization BID  . ceFEPime  1 g Intravenous Q24H  . diltiazem  120 mg Oral Daily  . feeding supplement (NEPRO CARB STEADY)  237 mL Oral BID BM  . feeding supplement OSMOLITE   65 mL/hr Per Tube Q24H  . fluticasone  2 spray Each  Nare Daily  . free water  230 mL Per Tube TID  . hydrALAZINE  50 mg Per Tube TID  . ipratropium-albuter  3 mL Nebulization TID  . isosorbide mononitrate  30 mg Oral Daily  . levothyroxine  137 mcg Oral QAC breakfast  . methylPREDNISolo  60 mg Intravenous Q12H  . multivitamin with minerals  1 tablet Oral Daily  . pantoprazole (PROTONIX) IV  40 mg Intravenous Q12H  . vancomycin  1,000 mg Intravenous Q24H  . warfarin  5 mg Oral ONCE-1800   Continuous Infusions:   LOS: 7 days    Time spent: 35 minutes  Greater than 50% of the time spent on counseling and coordinating the care.   Albertine Grates, MD PhD [facility] Pager [phone]  If 7PM-7AM, please contact night-coverage [email or web address] Password [facility] 05/29/2016, 12:43 PM

## 2016-05-29 NOTE — Progress Notes (Signed)
SATURATION QUALIFICATIONS: (This note is used to comply with regulatory documentation for home oxygen)  Patient Saturations on Room Air at Rest = 98%  Patient Saturations on Room Air while Ambulating = 93%    Please briefly explain why patient needs home oxygen: Pt did not need supplemental o2 with gait.

## 2016-05-29 NOTE — Progress Notes (Signed)
Occupational Therapy Treatment Patient Details Name: Shannon [name] MRN: [id number] DOB: 05-Jun-1923 Today's Date: 05/29/2016    History of present illness 83 year old female with past medical history significant for hypertension, dyslipidemia, CVA, COPD and asthma, chronic atrial fibrillation, diastolic congestive heart failure, diabetes, hypothyroidism, gastric outlet obstruction with prior Gtube. Patient presented with shortness of breath ever since she left the hospital 05/17/2016. Last hospitalization from 05/08/2016 through 05/17/2016 for community-acquired pneumonia and respiratory failure   OT comments  This 83 yo female admitted with above presents to acute OT today making progress with grooming and toileting transfers. Pt was on 3 liters of O2 when I entered with sats 97% and HR 76, had pt do all OT session on 1 liter and it never went below 92% with HR never greater than 76. Pt left on 1 liter of O2 after checking with RN.   Follow Up Recommendations  Supervision/Assistance - 24 hour;Home health OT    Equipment Recommendations  None recommended by OT       Precautions / Restrictions Precautions Precautions: Fall Restrictions Weight Bearing Restrictions: No       Mobility Bed Mobility Overal bed mobility: Needs Assistance Bed Mobility: Supine to Sit     Supine to sit: Min guard      Transfers Overall transfer level: Needs assistance Equipment used: Rolling walker (2 wheeled) Transfers: Sit to/from Stand Sit to Stand: Min assist            Balance Overall balance assessment: Needs assistance Sitting-balance support: No upper extremity supported;Feet supported Sitting balance-Leahy Scale: Fair     Standing balance support: Single extremity supported;During functional activity Standing balance-Leahy Scale: Poor Standing balance comment: reliant on at least one UE for support with standing ADL                   ADL Overall ADL's : Needs  assistance/impaired Eating/Feeding: Set up;Sitting   Grooming: Wash/dry hands;Wash/dry face;Oral care;Standing;Min guard                   Toilet Transfer: Minimal assistance;Ambulation;RW Toilet Transfer Details (indicate cue type and reason): bed>BSC 5 feet away Toileting- Clothing Manipulation and Hygiene: Moderate assistance (min A sit<>stand)                          Cognition   Behavior During Therapy: WFL for tasks assessed/performed Overall Cognitive Status: Within Functional Limits for tasks assessed                                    Pertinent Vitals/ Pain       Pain Assessment: No/denies pain         Frequency  Min 2X/week        Progress Toward Goals  OT Goals(current goals can now be found in the care plan section)  Progress towards OT goals: Progressing toward goals  Acute Rehab OT Goals Patient Stated Goal: to get better  Plan Discharge plan remains appropriate       End of Session Equipment Utilized During Treatment: Gait belt;Oxygen;Rolling walker (1 liter)  OT Visit Diagnosis: Unsteadiness on feet (R26.81);Muscle weakness (generalized) (M62.81)   Activity Tolerance Patient tolerated treatment well   Patient Left in chair;with call bell/phone within reach;with chair alarm set   Nurse Communication          Time: 2952-8413 OT Time Calculation (min):  43 min  Charges: OT General Charges $OT Visit: 1 Procedure OT Treatments $Self Care/Home Management : 38-52 mins  Ignacia Palma, OTR/L [phone] 05/29/2016

## 2016-05-29 NOTE — Progress Notes (Signed)
Inpatient Diabetes Program Recommendations  AACE/ADA: New Consensus Statement on Inpatient Glycemic Control (2015)  Target Ranges:  Prepandial:   less than 140 mg/dL      Peak postprandial:   less than 180 mg/dL (1-2 hours)      Critically ill patients:  140 - 180 mg/dL   Lab Results  Component Value Date   GLUCAP 285 (H) 05/29/2016   HGBA1C 7.4 (H) 03/11/2016    Review of Glycemic Control  FBS elevated x 2 days. May benefit from basal insulin addition.  Inpatient Diabetes Program Recommendations:     Add Levemir 8 units Q24H.  Will continue to follow.  Thank you. Lorenda Peck, RD, LDN, CDE Inpatient Diabetes Coordinator [phone]

## 2016-05-29 NOTE — Progress Notes (Signed)
Pharmacy Antibiotic Note  Shannon [name] is a 83 y.o. female admitted on 05/22/2016 with pneumonia.  Pharmacy has been consulted for vancomycin dosing. Patient is on cefepime dosed appropriately.   Vancomycin trough is 15 mcg/ml on vancomycin 1g q24h. However, level was drawn approximately an hour early and would likely be subtherapeutic. Will increase slightly.  Plan: Vancomycin 1250mg  IV every 24 hours.  Goal trough 15-20 mcg/mL.  Cefepime 1g IV q24h Monitor culture data, renal function and clinical course VT at SS prn  Height: 5\' 6"  (167.6 cm) Weight: 136 lb 1.6 oz (61.7 kg) (c scale) IBW/kg (Calculated) : 59.3  Temp (24hrs), Avg:98.1 F (36.7 C), Min:98 F (36.7 C), Max:98.3 F (36.8 C)   Recent Labs Lab 05/25/16 0335 05/26/16 0458 05/26/16 1003 05/27/16 0443 05/27/16 1018 05/28/16 0519 05/29/16 0527 05/29/16 1042  WBC 15.5* 15.3*  --  14.4*  --  13.6* 12.8*  --   CREATININE 1.16* 1.14*  --  1.11*  --  1.11* 1.08*  --   LATICACIDVEN 2.1* 3.5* 2.1* 2.9* 2.8* 2.5*  --   --   VANCOTROUGH  --   --   --   --   --   --   --  15    Estimated Creatinine Clearance: 32.4 mL/min (by C-G formula based on SCr of 1.08 mg/dL (H)).    Allergies  Allergen Reactions  . Penicillins Itching, Rash and Other (See Comments)    Has patient had a PCN reaction causing immediate rash, facial/tongue/throat swelling, SOB or lightheadedness with hypotension: Yes Has patient had a PCN reaction causing severe rash involving mucus membranes or skin necrosis: Unk Has patient had a PCN reaction that required hospitalization: Unk Has patient had a PCN reaction occurring within the last 10 years: Unk If all of the above answers are "NO", then may proceed with Cephalosporin use.      Andrey Cota. Diona Foley, PharmD, BCPS Clinical Pharmacist [phone] 05/29/2016 8:29 AM

## 2016-05-29 NOTE — Progress Notes (Signed)
[facility] for Warfarin  Indication: Atrial flutter  Allergies  Allergen Reactions  . Penicillins Itching, Rash and Other (See Comments)    Has patient had a PCN reaction causing immediate rash, facial/tongue/throat swelling, SOB or lightheadedness with hypotension: Yes Has patient had a PCN reaction causing severe rash involving mucus membranes or skin necrosis: Unk Has patient had a PCN reaction that required hospitalization: Unk Has patient had a PCN reaction occurring within the last 10 years: Unk If all of the above answers are "NO", then may proceed with Cephalosporin use.    Patient Measurements: Height: 5\' 6"  (167.6 cm) Weight: 136 lb 1.6 oz (61.7 kg) (c scale) IBW/kg (Calculated) : 59.3  Vital Signs: Temp: 98.2 F (36.8 C) (02/26 1138) Temp Source: Oral (02/26 1138) BP: 143/52 (02/26 1138) Pulse Rate: 67 (02/26 1138)  Labs:  Recent Labs  05/27/16 0443 05/28/16 0519 05/29/16 0527  HGB 10.9* 10.7* 10.2*  HCT 32.2* 32.0* 31.0*  PLT 390 365 346  LABPROT 28.8* 28.7* 31.4*  INR 2.65 2.64 2.95  CREATININE 1.11* 1.11* 1.08*    Estimated Creatinine Clearance: 32.4 mL/min (by C-G formula based on SCr of 1.08 mg/dL (H)).    Assessment: 83 y/o F on warfarin PTA for aflutter, just discharged on 2/14, back to ED with worsening shortness of breath treated with ABX for possible pna. Pharmacy consulted to dose warfarin.   PTA warfarin dose = 3.75mg /day except 2.5mg  on Mon.   INR therapeutic after dose increase x 3 days (2/19-2/21) then return to home dose. Patient with poor-to-moderate PO intake, on ABX and prednisone. No bleeding noted. CBC stable.   INR has continued to trend up and patient has had poor oral intake. Will lower dose tonight.  Goal of Therapy:  INR 2-3 Monitor platelets by anticoagulation protocol: Yes   Plan:  Warfarin 2.5mg  tonight x1 Daily INR Monitor s/sx of bleeding  Andrey Cota. Diona Foley, PharmD, BCPS Clinical  Pharmacist [phone] 2/26/201812:45 PM

## 2016-05-29 NOTE — Consult Note (Signed)
Name: Elan Tetzloff MRN: [id number] DOB: 06-13-1923    ADMISSION DATE:  05/22/2016 CONSULTATION DATE:  05/29/2016  REFERRING MD :  Dr. Roda Shutters [facility]  CHIEF COMPLAINT:  SOB  BRIEF PATIENT DESCRIPTION: 83 year old female admitted to SOB with history of recurrent PNA. Found to have LUL collapse on CT and PCCM consulted for potential bronchoscopic evaluation.  SIGNIFICANT EVENTS  Admit 2/5 > 2/14 admit for CAP and hyponatremia 2/19 admit for HCAP  STUDIES:  CT chest 2/24 > Left upper lobe collapse without identifiable cause on this study. Broncholiths within the collapsed left upper lobe identified. Nonspecific bilateral central ground-glass opacities -question edema, infection or nonspecific inflammation. Cardiomegaly, coronary artery disease and thoracic atherosclerotic calcifications.   HISTORY OF PRESENT ILLNESS:  83 year old female with PMH as below, which is significant for gastric outlet obstruction s/p G tube (switched to J tube 2/13), COPD, CKD, and recent CAP requiring admission discharged 05/17/2016. She was on amio for Afib, which was stopped during last admit with concerns for lung toxicity. She is followed by Dr. Isaiah Serge in the pulmonary clinic for COPD although she has never been a smoker, so this diagnosis is not entirely clear to him. She was felt to be unable to tolerate PFT.  She presented to [facility] ED 2/19 with complaints of respiratory distress. In the ED she was found to be hypoxic which improved with Bi PAP. She was started on empiric antibiotics based on CXR demonstrating PNA, although appeared to be improving. Also treated for COPD exacerbation with nebs and steroids. She has been slow to improve despite these therapies, which raises concern for continued aspiration PNA. CT was performed 2/14 which demonstrated LUL collapse and PCCM has been consulted for further evaluation.  PAST MEDICAL HISTORY :   has a past medical history of Anemia; Arthritis; Asthma; Bradycardia; CAD  (coronary artery disease); Chronic diastolic CHF (congestive heart failure) (HCC); CKD (chronic kidney disease), stage III; Complication of blood transfusion; COPD (chronic obstructive pulmonary disease) (HCC); Depression; DVT (deep venous thrombosis) (HCC) (01/2016); Gastric stenosis; GERD (gastroesophageal reflux disease); History of blood transfusion; History of stomach ulcers; Hyperlipidemia; Hypertension; Hypothyroidism; Paraesophageal hernia; Perforated gastric ulcer (HCC); Seasonal allergies; SIADH (syndrome of inappropriate ADH production) (HCC); Small bowel obstruction; Stroke (HCC); Type II diabetes mellitus (HCC); and Ventral hernia with bowel obstruction.  has a past surgical history that includes Tubal ligation; Colectomy; Esophagogastroduodenoscopy (N/A, 01/19/2014); Esophagogastroduodenoscopy (N/A, 01/20/2014); Esophagogastroduodenoscopy (N/A, 03/19/2014); Ventral hernia repair (2015); Cataract extraction w/ intraocular lens  implant, bilateral; Gastrojejunostomy; laparotomy (N/A, 01/20/2015); Lysis of adhesion (N/A, 01/20/2015); Esophagogastroduodenoscopy (N/A, 07/08/2015); Esophagogastroduodenoscopy (egd) with propofol (N/A, 09/15/2015); Gastrojejunostomy (N/A, 09/23/2015); ir generic historical (01/07/2016); Tonsillectomy; Cholecystectomy open; Hernia repair (2015); Glaucoma surgery (Bilateral); ir generic historical (01/27/2016); ir generic historical (02/07/2016); ir generic historical (02/08/2016); ir generic historical (01/06/2016); ir generic historical (05/02/2016); and ir generic historical (05/15/2016). Prior to Admission medications   Medication Sig Start Date End Date Taking? Authorizing Provider  AMINO ACIDS-PROTEIN HYDROLYS PO Take 30 mLs by mouth 2 (two) times daily.   Yes Historical Provider, MD  atorvastatin (LIPITOR) 10 MG tablet TAKE ONE TABLET BY MOUTH ONCE DAILY 05/22/16  Yes Kirt Boys, DO  b complex vitamins tablet Take 1 tablet by mouth daily.    Yes Historical Provider, MD    bacitracin-polymyxin b (POLYSPORIN) ophthalmic ointment Place 1 application into both eyes 2 (two) times daily. apply to eye every 12 hours while awake   Yes Historical Provider, MD  benzonatate (TESSALON) 200 MG  capsule Take 1 capsule (200 mg total) by mouth 3 (three) times daily as needed for cough. 03/24/16  Yes Kirt Boys, DO  bisoprolol (ZEBETA) 5 MG tablet Take 1 tablet (5 mg total) by mouth daily. 05/18/16  Yes Jennifer Chahn-Yang Choi, DO  calcium-vitamin D (OSCAL WITH D) 500-200 MG-UNIT per tablet Take 1 tablet by mouth daily with breakfast.    Yes Historical Provider, MD  chlorhexidine (PERIDEX) 0.12 % solution Use as directed 15 mLs in the mouth or throat 2 (two) times daily.   Yes Historical Provider, MD  diltiazem (CARDIZEM CD) 120 MG 24 hr capsule Take 1 capsule (120 mg total) by mouth daily. 02/23/16 05/23/16 Yes Hillis Range, MD  feeding supplement, GLUCERNA SHAKE, (GLUCERNA SHAKE) LIQD Take 237 mLs by mouth See admin instructions. One to two times a day   Yes Historical Provider, MD  fluticasone (FLONASE) 50 MCG/ACT nasal spray Place 2 sprays into both nostrils daily. 08/07/14  Yes Kirt Boys, DO  furosemide (LASIX) 40 MG tablet Take 1 tablet (40 mg total) by mouth daily. 05/17/16  Yes Jennifer Chahn-Yang Choi, DO  gabapentin (NEURONTIN) 300 MG capsule Take 1 capsule (300 mg total) by mouth at bedtime. 05/17/16  Yes Jennifer Chahn-Yang Choi, DO  hydrALAZINE (APRESOLINE) 50 MG tablet Take 1 tablet (50 mg total) by mouth 3 (three) times daily. 11/11/15  Yes Rodolph Bong, MD  ipratropium-albuterol (DUONEB) 0.5-2.5 (3) MG/3ML SOLN Take 3 mLs by nebulization every 6 (six) hours as needed (sob). Use 3 times daily x 4 days then every 6 hours as needed. Patient taking differently: Take 3 mLs by nebulization See admin instructions. Every 4 to 6 hours (up to 6 times a day) 03/21/16  Yes Janetta Hora, PA-C  isosorbide mononitrate (IMDUR) 30 MG 24 hr tablet TAKE 1 TABLET (30 MG TOTAL)  BY MOUTH DAILY. 04/13/16  Yes Lars Masson, MD  latanoprost (XALATAN) 0.005 % ophthalmic solution Place 1 drop into both eyes at bedtime. 06/02/14  Yes Mahima Glade Lloyd, MD  levalbuterol [facility] HFA) 45 MCG/ACT inhaler Inhale 2 puffs into the lungs every 6 (six) hours as needed for wheezing or shortness of breath. 02/03/16  Yes Monica Carter, DO  levofloxacin (LEVAQUIN) 500 MG tablet Take 500 mg by mouth daily. 05/19/16  Yes Historical Provider, MD  levothyroxine (SYNTHROID, LEVOTHROID) 137 MCG tablet TAKE 1 TABLET (137 MCG TOTAL) BY MOUTH DAILY BEFORE BREAKFAST. 04/04/16  Yes Kirt Boys, DO  loratadine (CLARITIN) 10 MG tablet Take 10 mg by mouth daily.   Yes Historical Provider, MD  Maltodextrin-Xanthan Gum (RESOURCE THICKENUP CLEAR) POWD Take 120 g by mouth as needed (use to thicken liquids.). 11/11/15  Yes Rodolph Bong, MD  Multiple Vitamin (MULTIVITAMIN WITH MINERALS) TABS tablet Take 1 tablet by mouth daily.   Yes Historical Provider, MD  Nutritional Supplements (FEEDING SUPPLEMENT, OSMOLITE 1.5 CAL,) LIQD Place 1,000 mLs into feeding tube daily. 09/30/15  Yes Calvert Cantor, MD  pantoprazole (PROTONIX) 40 MG tablet TAKE 1 TABLET BY MOUTH TWICE A DAY 03/09/16  Yes Kirt Boys, DO  Polyethyl Glycol-Propyl Glycol 0.4-0.3 % SOLN Place 1 drop into both eyes 4 (four) times daily.    Yes Historical Provider, MD  RESTASIS 0.05 % ophthalmic emulsion USE 1 DROP INTO BOTH EYES TWICE DAILY 11/01/15  Yes Kimber Relic, MD  sertraline (ZOLOFT) 100 MG tablet TAKE ONE TABLET BY MOUTH ONCE DAILY FOR DEPRESSION 04/10/16  Yes Kirt Boys, DO  warfarin (COUMADIN) 2.5 MG tablet Take as directed by  Coumad[facility] Clinic. Patient taking differently: Take 2.5 mg by mouth daily.  02/16/16  Yes Lars Masson, MD  Water For Irrigation, Sterile (FREE WATER) SOLN Place 230 mLs into feeding tube 3 (three) times daily.   Yes Historical Provider, MD  acetaminophen (TYLENOL) 325 MG tablet Take 650 mg by mouth 2 (two) times  daily as needed for mild pain.     Historical Provider, MD  atorvastatin (LIPITOR) 10 MG tablet CHOLESTEROL CHECK OVERDUE Take one tablet by mouth once daily Patient not taking: Reported on 05/22/2016 05/18/16   Kirt Boys, DO  bisacodyl (DULCOLAX) 10 MG suppository Place 1 suppository (10 mg total) rectally daily as needed for moderate constipation. Patient not taking: Reported on 05/08/2016 09/30/15   Calvert Cantor, MD  feeding supplement (BOOST / RESOURCE BREEZE) LIQD Take 1 Container by mouth daily. Patient not taking: Reported on 05/08/2016 11/11/15   Rodolph Bong, MD  HYDROcodone-acetaminophen [facility]) 5-325 MG tablet Take 1 tablet by mouth every 6 (six) hours as needed for moderate pain. 02/18/16   Kirt Boys, DO  polyethylene glycol powder (GLYCOLAX/MIRALAX) powder MIX 17GRAMS IN 8 OUNCES OF LIQUID AND DRINK TWICE DAILY AS NEEDED Patient not taking: Reported on 05/08/2016 09/13/15   Kirt Boys, DO  traMADol (ULTRAM) 50 MG tablet Take 50 mg by mouth daily as needed for moderate pain.     Historical Provider, MD   Allergies  Allergen Reactions  . Penicillins Itching, Rash and Other (See Comments)    Has patient had a PCN reaction causing immediate rash, facial/tongue/throat swelling, SOB or lightheadedness with hypotension: Yes Has patient had a PCN reaction causing severe rash involving mucus membranes or skin necrosis: Unk Has patient had a PCN reaction that required hospitalization: Unk Has patient had a PCN reaction occurring within the last 10 years: Unk If all of the above answers are "NO", then may proceed with Cephalosporin use.     FAMILY HISTORY:  family history includes Diabetes in her brother; Hypertension in her mother; Stroke in her mother. SOCIAL HISTORY:  reports that she has never smoked. She has quit using smokeless tobacco. Her smokeless tobacco use included Snuff. She reports that she does not drink alcohol or use drugs.  REVIEW OF SYSTEMS:  Bolds are positive   Constitutional: weight loss, gain, night sweats, Fevers, chills, fatigue .  HEENT: headaches, Sore throat, sneezing, nasal congestion, post nasal drip, Difficulty swallowing, Tooth/dental problems, visual complaints visual changes, ear ache CV:  chest pain, radiates:,Orthopnea, PND, swelling in lower extremities, dizziness, palpitations, syncope.  GI  heartburn, indigestion, abdominal pain, nausea, vomiting, diarrhea, change in bowel habits, loss of appetite, bloody stools.  Resp: cough, productive: , hemoptysis, dyspnea, chest pain, pleuritic.  Skin: rash or itching or icterus GU: dysuria, change in color of urine, urgency or frequency. flank pain, hematuria  MS: joint pain or swelling. decreased range of motion  Psych: change in mood or affect. depression or anxiety.  Neuro: difficulty with speech, weakness, numbness, ataxia    SUBJECTIVE:   VITAL SIGNS: Temp:  [98 F (36.7 C)-98.3 F (36.8 C)] 98.1 F (36.7 C) (02/26 0614) Pulse Rate:  [62-80] 62 (02/26 0900) Resp:  [17-18] 18 (02/26 0614) BP: (135-170)/(51-73) 170/56 (02/26 0900) SpO2:  [98 %-100 %] 100 % (02/26 0942) Weight:  [61.7 kg (136 lb 1.6 oz)] 61.7 kg (136 lb 1.6 oz) (02/26 0454)  PHYSICAL EXAMINATION: General:  Frail elderly female in NAD Neuro:  Alert, oriented, non-focal HEENT:  [place]/AT, PERRL, mild JVD Cardiovascular:  RRR, no MRG Lungs:  Diminished left, limited due to transmitted upper airway sounds.  Abdomen:  Soft, non-tender Musculoskeletal:  No acute deformity Skin:  Grossly intact   Recent Labs Lab 05/27/16 0443 05/28/16 0519 05/29/16 0527  NA 140 135 136  K 3.5 3.8 3.9  CL 101 100* 100*  CO2 27 25 29   BUN 47* 45* 43*  CREATININE 1.11* 1.11* 1.08*  GLUCOSE 374* 345* 304*    Recent Labs Lab 05/27/16 0443 05/28/16 0519 05/29/16 0527  HGB 10.9* 10.7* 10.2*  HCT 32.2* 32.0* 31.0*  WBC 14.4* 13.6* 12.8*  PLT 390 365 346   Ct Chest Wo Contrast  Result Date: 05/28/2016 CLINICAL DATA:   Follow-up pneumonia. EXAM: CT CHEST WITHOUT CONTRAST TECHNIQUE: Multidetector CT imaging of the chest was performed following the standard protocol without IV contrast. COMPARISON:  05/22/2016 and prior radiographs.  07/07/2015 chest CT. FINDINGS: Cardiovascular: Cardiomegaly and coronary artery calcifications again noted. Thoracic aortic atherosclerotic calcifications again identified without aneurysm. No pericardial effusion identified. Mediastinum/Nodes: No mediastinal mass or enlarged lymph nodes. Lungs/Pleura: Left upper lobe collapse is identified without definite obstructing cause. There are several calcifications within the collapsed left upper lobe. Central ground-glass opacities within both lungs are noted and nonspecific. This may represent edema, nonspecific inflammation or infection. Mucous filling several right lower lobe bronchi are noted. Upper Abdomen: An NG tube in the stomach is noted. No acute abnormalities identified. Musculoskeletal: No acute or suspicious abnormality. A T11 compression fracture is unchanged. IMPRESSION: Left upper lobe collapse without identifiable cause on this study. Broncholiths within the collapsed left upper lobe identified. Nonspecific bilateral central ground-glass opacities -question edema, infection or nonspecific inflammation. Cardiomegaly, coronary artery disease and thoracic atherosclerotic calcifications. Electronically Signed   By: Harmon Pier M.D.   On: 05/28/2016 07:20    ASSESSMENT / PLAN:  LUL collapse:  No clear mass or lesion identified on CT. This is most likely a consolidative process or post-obstructive change in the setting of mucous. Although bronchoscopy is one possible means for evaluation of this, given her age, frailty, and several chronic medical conditions she is not an ideal candidate for this. I believe it is reasonable to treat her with antibiotics and follow imaging. If acute illness resolves and collapse remains, can revisit the  conversation for bronchoscopy. However, even in that setting her candidacy will still be in question.   Acute hypoxemic respiratory failure: Multifactorial. Largely PNA related but CHF and questionable COPD also playing a role. PNA likely due to recurrent aspiration. - Agree with nebs, transition to PO steroids for short taper.  - ABX per primary, trend PCT - Supplemental O2 PRN to keep Spo2 > 90% - Diet requirements per SLP - Pulmonary follow up as outpatient.  - CXR in AM  PAF, CHF, CKD - Per primary  Joneen Roach, AGACNP-BC [facility] Pulmonology/Critical Care Pager [phone] or [phone]  05/29/2016 11:52 AM  Attending Note:  83 year old female with history of aspiration who present with LUL collapse.  Patient has a PEG in.  On exam, decreased BS on the bases.  Patient is eating.  I reviewed CXR myself, LUL collapse noted.  Discussed with PCCM-NP.  Atelectasis due to aspiration.  - No role for bronchoscopy here.  - Aspiration precautions  Mucous plugging:  - Chest PT  - Ambulate  - Aspiration precautions  Hypoxemia:  - Titrate O2 for sat of 88-92%  - Ambulatory desat study prior to discharge for ?of home O2.  Aspiration Pneumonia  -  Cefepime/vanc, may consider transition to augmentin pending cultures  - F/U on cutlure  May need to consider palliation here if patient is to continue to eat.  Bronchoscopy is counter productive here since we will not be able to stop the cause of mucous plugging/atelectasis which is aspiration in a patient that continues to eat.  Recommend a palliative approach.  PCCM will sign off, please call back if needed.  Patient seen and examined, agree with above note.  I dictated the care and orders written for this patient under my direction.  Alyson Reedy, MD [phone]

## 2016-05-30 LAB — BASIC METABOLIC PANEL
Anion gap: 8 (ref 5–15)
BUN: 43 mg/dL — ABNORMAL HIGH (ref 6–20)
CO2: 28 mmol/L (ref 22–32)
Calcium: 8.8 mg/dL — ABNORMAL LOW (ref 8.9–10.3)
Chloride: 101 mmol/L (ref 101–111)
Creatinine, Ser: 1.12 mg/dL — ABNORMAL HIGH (ref 0.44–1.00)
GFR calc Af Amer: 49 mL/min — ABNORMAL LOW (ref >60)
GFR calc non Af Amer: 42 mL/min — ABNORMAL LOW (ref >60)
Glucose, Bld: 279 mg/dL — ABNORMAL HIGH (ref 65–99)
Potassium: 3.8 mmol/L (ref 3.5–5.1)
Sodium: 137 mmol/L (ref 135–145)

## 2016-05-30 LAB — CBC WITH DIFFERENTIAL/PLATELET
Basophils Absolute: 0 10*3/uL (ref 0.0–0.1)
Basophils Relative: 0 %
Eosinophils Absolute: 0 10*3/uL (ref 0.0–0.7)
Eosinophils Relative: 0 %
HCT: 30.5 % — ABNORMAL LOW (ref 36.0–46.0)
Hemoglobin: 10.2 g/dL — ABNORMAL LOW (ref 12.0–15.0)
Lymphocytes Relative: 9 %
Lymphs Abs: 1.1 10*3/uL (ref 0.7–4.0)
MCH: 27.9 pg (ref 26.0–34.0)
MCHC: 33.4 g/dL (ref 30.0–36.0)
MCV: 83.6 fL (ref 78.0–100.0)
Monocytes Absolute: 1.6 10*3/uL — ABNORMAL HIGH (ref 0.1–1.0)
Monocytes Relative: 13 %
Neutro Abs: 9.9 10*3/uL — ABNORMAL HIGH (ref 1.7–7.7)
Neutrophils Relative %: 78 %
Platelets: 327 10*3/uL (ref 150–400)
RBC: 3.65 MIL/uL — ABNORMAL LOW (ref 3.87–5.11)
RDW: 20.3 % — ABNORMAL HIGH (ref 11.5–15.5)
WBC: 12.6 10*3/uL — ABNORMAL HIGH (ref 4.0–10.5)

## 2016-05-30 LAB — GLUCOSE, CAPILLARY
Glucose-Capillary: 281 mg/dL — ABNORMAL HIGH (ref 65–99)
Glucose-Capillary: 94 mg/dL (ref 65–99)

## 2016-05-30 LAB — PROTIME-INR
INR: 2.87
Prothrombin Time: 30.7 seconds — ABNORMAL HIGH (ref 11.4–15.2)

## 2016-05-30 LAB — LACTIC ACID, PLASMA: Lactic Acid, Venous: 1.9 mmol/L (ref 0.5–1.9)

## 2016-05-30 MED ORDER — WARFARIN SODIUM 7.5 MG PO TABS: 3.7500 mg | ORAL_TABLET | Freq: Once | ORAL | Status: DC

## 2016-05-30 MED ORDER — PREDNISONE 10 MG PO TABS: ORAL_TABLET | ORAL | 0 refills | Status: DC

## 2016-05-30 MED ORDER — BISOPROLOL FUMARATE 5 MG PO TABS: 10.0000 mg | ORAL_TABLET | Freq: Every day | ORAL | 0 refills | Status: DC

## 2016-05-30 MED ORDER — DOXYCYCLINE HYCLATE 100 MG PO CAPS: 100.0000 mg | ORAL_CAPSULE | Freq: Two times a day (BID) | ORAL | 0 refills | Status: AC

## 2016-05-30 MED ORDER — INSULIN ASPART 100 UNIT/ML FLEXPEN: PEN_INJECTOR | SUBCUTANEOUS | 1 refills | Status: DC

## 2016-05-30 MED ORDER — LEVOFLOXACIN 500 MG PO TABS: 500.0000 mg | ORAL_TABLET | Freq: Every day | ORAL | 0 refills | Status: AC

## 2016-05-30 MED ORDER — NEPRO/CARBSTEADY PO LIQD: 237.0000 mL | Freq: Two times a day (BID) | ORAL | 0 refills | Status: DC

## 2016-05-30 NOTE — Progress Notes (Signed)
Physical Therapy Treatment Patient Details Name: Grasyn Marcial MRN: [id number] DOB: 1923-06-10 Today's Date: 05/30/2016    History of Present Illness 83 year old female with past medical history significant for hypertension, dyslipidemia, CVA, COPD and asthma, chronic atrial fibrillation, diastolic congestive heart failure, diabetes, hypothyroidism, gastric outlet obstruction with prior Gtube. Patient presented with shortness of breath ever since she left the hospital 05/17/2016. Last hospitalization from 05/08/2016 through 05/17/2016 for community-acquired pneumonia and respiratory failure    PT Comments    Pt performed gait with good maintenance of SPO2 on RA.  Pt educated to continue mobility at home.  Informed family to make sure patient is able to mobilize multiple times throughout day to maintain strength and respiratory function.  Patient's daughter anxious to leave so treatment limited to ambulation/gait training.    Follow Up Recommendations  Home health PT     Equipment Recommendations  None recommended by PT    Recommendations for Other Services       Precautions / Restrictions Precautions Precautions: Fall Restrictions Weight Bearing Restrictions: No    Mobility  Bed Mobility               General bed mobility comments: Pt sitting edge of bed on arrival.    Transfers Overall transfer level: Needs assistance Equipment used: Rolling walker (2 wheeled) Transfers: Sit to/from Stand Sit to Stand: Supervision Stand pivot transfers: Supervision       General transfer comment: Cues for hand placement.    Ambulation/Gait Ambulation/Gait assistance: Supervision Ambulation Distance (Feet): 200 Feet Assistive device: Rolling walker (2 wheeled) Gait Pattern/deviations: Step-through pattern;Trunk flexed Gait velocity: decreased   General Gait Details: SPO2 on RA 90%-94%.  Pt did not require rest breaks.  Pt required cues for pursed lip breathing and upper trunk  control.     Stairs            Wheelchair Mobility    Modified Rankin (Stroke Patients Only)       Balance Overall balance assessment: Needs assistance Sitting-balance support: No upper extremity supported;Feet supported Sitting balance-Leahy Scale: Good       Standing balance-Leahy Scale: Fair                      Cognition Arousal/Alertness: Awake/alert Behavior During Therapy: WFL for tasks assessed/performed Overall Cognitive Status: Within Functional Limits for tasks assessed                      Exercises      General Comments        Pertinent Vitals/Pain      Home Living                      Prior Function            PT Goals (current goals can now be found in the care plan section) Acute Rehab PT Goals Patient Stated Goal: to get better Potential to Achieve Goals: Good Progress towards PT goals: Progressing toward goals    Frequency    Min 3X/week      PT Plan Current plan remains appropriate    Co-evaluation             End of Session Equipment Utilized During Treatment: Gait belt Activity Tolerance: Patient tolerated treatment well Patient left: in chair;with call bell/phone within reach;with chair alarm set Nurse Communication: Mobility status;Other (comment) (O2 sats.  ) PT Visit Diagnosis: Difficulty in walking, not elsewhere classified (  R26.2)     Time: 9323-5573 PT Time Calculation (min) (ACUTE ONLY): 10 min  Charges:  $Gait Training: 8-22 mins                    G Codes:       Florestine Avers 2016-06-12, 2:18 PM Joycelyn Rua, PTA pager [phone]

## 2016-05-30 NOTE — Progress Notes (Signed)
Patient ambulate 141ft on room air, maintained 97% O2 Sats.

## 2016-05-30 NOTE — Progress Notes (Signed)
SATURATION QUALIFICATIONS: (This note is used to comply with regulatory documentation for home oxygen)  Patient Saturations on Room Air at Rest = 97%  Patient Saturations on Room Air while Ambulating = 97%    Please briefly explain why patient needs home oxygen: 

## 2016-05-30 NOTE — Progress Notes (Signed)
[place] for Warfarin  Indication: Atrial flutter  Allergies  Allergen Reactions  . Penicillins Itching, Rash and Other (See Comments)    Has patient had a PCN reaction causing immediate rash, facial/tongue/throat swelling, SOB or lightheadedness with hypotension: Yes Has patient had a PCN reaction causing severe rash involving mucus membranes or skin necrosis: Unk Has patient had a PCN reaction that required hospitalization: Unk Has patient had a PCN reaction occurring within the last 10 years: Unk If all of the above answers are "NO", then may proceed with Cephalosporin use.    Patient Measurements: Height: 5\' 6"  (167.6 cm) Weight: 137 lb 4.8 oz (62.3 kg) (c scale) IBW/kg (Calculated) : 59.3  Vital Signs: Temp: 98.4 F (36.9 C) (02/27 0639) Temp Source: Oral (02/27 0639) BP: 170/53 (02/27 1200) Pulse Rate: 70 (02/27 1200)  Labs:  Recent Labs  05/28/16 0519 05/29/16 0527 05/30/16 0530  HGB 10.7* 10.2* 10.2*  HCT 32.0* 31.0* 30.5*  PLT 365 346 327  LABPROT 28.7* 31.4* 30.7*  INR 2.64 2.95 2.87  CREATININE 1.11* 1.08* 1.12*    Estimated Creatinine Clearance: 31.3 mL/min (by C-G formula based on SCr of 1.12 mg/dL (H)).    Assessment: 83 y/o F on warfarin PTA for aflutter, just discharged on 2/14, back to ED with worsening shortness of breath treated with ABX for possible pna. Pharmacy consulted to dose warfarin.   PTA warfarin dose = 3.75mg /day except 2.5mg  on Mon.   INR therapeutic after dose increase x 3 days (2/19-2/21) then return to home dose. Patient with poor-to-moderate PO intake, on ABX and prednisone. No bleeding noted. CBC stable.   Goal of Therapy:  INR 2-3 Monitor platelets by anticoagulation protocol: Yes   Plan:  Warfarin 3.75mg  tonight x1 Daily INR Monitor s/sx of bleeding  Andrey Cota. Diona Foley, PharmD, BCPS Clinical Pharmacist [phone] 2/27/201812:38 PM

## 2016-05-30 NOTE — Discharge Summary (Addendum)
Discharge Summary  Shannon [name] OVF:[id number] DOB: 25-Jun-1923  PCP: Kirt Boys, DO  Admit date: 05/22/2016 Discharge date: 05/30/2016  Time spent: >56mins, 50% time spent of coordination of care and patient's counseling   Recommendations for Outpatient Follow-up:  1. F/u with PMD within a week  for hospital discharge follow up, repeat cbc/bmp at follow up 2. F/u with pulmonology for recurrent aspiration pneumonia 3. F/u with cardiology for afib and diastolic chf 4. Maximize home health, with PT/RN/speech/respiratory/social worker, referral to palliative care if patient and family are agreeable to it.  Discharge Diagnoses:  Active Hospital Problems   Diagnosis Date Noted  . Acute respiratory failure with hypoxia (HCC) 05/22/2016  . Aspiration pneumonitis (HCC)   . Other specified hypothyroidism   . PEG (percutaneous endoscopic gastrostomy) status (HCC) 03/10/2016  . Chronic diastolic CHF (congestive heart failure) (HCC) 01/24/2016  . Hypothyroidism due to medication   . Hyponatremia 03/23/2014  . Persistent atrial fibrillation (HCC) 09/22/2013    Resolved Hospital Problems   Diagnosis Date Noted Date Resolved  No resolved problems to display.    Discharge Condition: stable  Diet recommendation:  Nutrition mainly per j tube,  Need thickener if patient desires to have oral intake, strict aspiration precaution, continue follow speech therapist .  Ceasar Mons Weights   05/28/16 0305 05/29/16 0614 05/30/16 0639  Weight: 61.7 kg (136 lb 1.6 oz) 61.7 kg (136 lb 1.6 oz) 62.3 kg (137 lb 4.8 oz)    History of present illness:  PCP: Kirt Boys, DO  Patient coming from: Nursing facility via EMS  Chief Complaint: Shortness of breath  HPI: Shannon [name] is a 83 y.o. female with medical history significant of HTN, HLD, CVA, COPD/asthma, chronic A.fib, diastolic CHF, DM type 2, hypothyroidism, CAD, bradycardia, gastric outlet obstruction s/p prior G-tube, anemia; who presents with  complaints of shortness of breath since last leaving the hospital. History is obtained via reported patient unable to provide her own history at this time due to acute respiratory distress. Hospitalized from 2/5-2/14 for acute respiratory distress and suspected community-acquired pneumonia and treated with full 7 day course of antibiotics. Cardiology stopped amiodarone secondary to suspected lung toxicity. The patient was discharged to skilled nursing facility. Patient Apparently developed worsening shortness of breath for which EMS was called.  ED Course: En route ems had been given 10 mg  albuterol, 1 atrovent, 2 mg magnesium sulfate, 125 mg solumedrol IV. Initially placed on nonrebreather as refused CPAP. Upon admission to the emergency department initial ABG read pH 7.37, pCO2 49.7, p02 47. Lab work revealed WBC 8.3, hemoglobin 11.4, platelets 418, sodium 131, BUN 48, creatinine 1.47, glucose 178, troponin,and BNP 271. Patient was placed on BiPAP with improvement of respiratory distress. Patient was initially given vancomycin and aztreonam for possible healthcare associated pneumonia. However, chest x-ray revealed resolving pneumonia. Urinalysis was clear signs of infection  Hospital Course:  Principal Problem:   Acute respiratory failure with hypoxia (HCC) Active Problems:   Persistent atrial fibrillation (HCC)   Hyponatremia   Hypothyroidism due to medication   Chronic diastolic CHF (congestive heart failure) (HCC)   PEG (percutaneous endoscopic gastrostomy) status (HCC)   Aspiration pneumonitis (HCC)   Other specified hypothyroidism   Acute on chronic respiratory failure with hypoxia / Acute COPD exacerbation/recurrent aspiration ( per patient  She is not dependent on 02 at baseline) - Likely combination of recurrent aspiration pneumonia /health care associated pna (mrsa screen +), sputum not collected - she initially required bipap, much improved, now off  oxygen, Patient Saturations on  Room Air at Rest = 97% Patient Saturations on Room Air while Ambulating = 97% -she is discharged home with abx/nabs taper steroids to finish treatment course.   Sepsis / Healthcare associated pneumonia / aspiration pneumonia/Lactic acidosis /left upper lobe collapse - Sepsis criteria met on the admission with tachypnea, hypoxia, lactic acidosis of 2.3 and 3 - Source of infection likely healthcare associated pneumonia considering patient's last recent hospitalization within 5 days, and recurrent aspiration pneumonia -  Blood cx , urine culture no growth,  Respiratory virus panel is negative  -Continue cefepime and vanco stopped 2/21, restart vanc restarted due to persistent elevated lactic acid, and mrsa screen is positive.  - chest CT "Left upper lobe collapse without identifiable cause on this study. Broncholiths within the collapsed left upper lobe identified.Nonspecific bilateral central ground-glass opacities -question edema, infection or nonspecific inflammation." -Pulmonology consulted, recommend continue medial management and chest PT, outpatient follow up with pulmonology, detail please see consult note. -she has improved, persistently elevated lactic acid has normalized, she is weaned off oxygen, she is discharge home with levaquin and doxycycline for days to finish total of 14days abx treatment . She is discharged home with home health and respiratory therapist.  Steroid induced hyperglycemia - Continue SSI, taper steroids -she is discharged on ssi  Persistent atrial fibrillation, h/o CVA (Chronic LEFT cerebellar infarct. Chronic BILATERAL thalamic infarcts.) - CHADS vasc score 6 - Continue anticoagulation with coumadin - Rate controlled with bisoprolol and Cardizem  Chronic diastolic CHF - EF 60% based on 2 D ECHO 05/09/2016 - BNP in 200 on this admission - Continue current meds, no edema  Chronic kidney disease stage 3 - Cr on this admission 1.31, at baseline  compared to recent creatinine values   Essential hypertension - bp still elevated on  Cardizem /Bisoprolol/ Hydralazine/ Imdur  bp better controlled with Increase bisoprolol on 2/24 and taper steroids  Hypothyroidism - Continue synthroid   Dyslipidemia - Continue statin therapy   Gastric outlet obstruction, s/p j tube placement, on tube feeds, still have to eat by mouth, on dysphagia diet -She is on J tube feeds with minimal PO intake at home,  Per SLP eval - nectar thick liquids   MRSA colonization: contact precaution and decolonization  FTT: palliative care consulted, family does not want to consider comfort measures,  home health arranged, consider to refer patient to outpatient palliative care when patient and family are agreeable to it.  DVT prophylaxis: On Coumadin  Code Status: DNR/DNI Family Communication: patient and daughter Disposition Plan: d/c home with home health on 2/27   Consultants:   SLP  PT  Palliative care  pulmonology  Procedures:   BiPAP  Antimicrobials:   Vanco 05/22/2016 --> 05/24/2016  , restarted on 2/23  Cefepime 05/22/2016 --> 2/27    Discharge Exam: BP (!) 170/53 (BP Location: Left Arm)   Pulse 70   Temp 98.4 F (36.9 C) (Oral)   Resp 18   Ht 5\' 6"  (1.676 m)   Wt 62.3 kg (137 lb 4.8 oz) Comment: c scale  SpO2 96%   BMI 22.16 kg/m   General: NAD, able to finish full sentences without dyspnea, aaox3 Cardiovascular: S1 & S2 heard, Rate controlled  Respiratory: rhonchorous, vs upper airway sounds, wheezing has resolved. Gastrointestinal system: (+) BS, non tender, J tube in place  Extremities: No swelling, palpable pulses   Discharge Instructions You were cared for by a hospitalist during your hospital stay. If you  have any questions about your discharge medications or the care you received while you were in the hospital after you are discharged, you can call the unit and asked to speak with the hospitalist on call  if the hospitalist that took care of you is not available. Once you are discharged, your primary care physician will handle any further medical issues. Please note that NO REFILLS for any discharge medications will be authorized once you are discharged, as it is imperative that you return to your primary care physician (or establish a relationship with a primary care physician if you do not have one) for your aftercare needs so that they can reassess your need for medications and monitor your lab values.  Discharge Instructions    Diet - low sodium heart healthy    Complete by:  As directed    Aspiration precaution, continue nightly tube feeds.   Face-to-face encounter (required for Medicare/Medicaid patients)    Complete by:  As directed    I Lecretia Buczek certify that this patient is under my care and that I, or a nurse practitioner or physician's assistant working with me, had a face-to-face encounter that meets the physician face-to-face encounter requirements with this patient on 05/29/2016. The encounter with the patient was in whole, or in part for the following medical condition(s) which is the primary reason for home health care (List medical condition): FTT RN to check INR in three days on March 2nd, report result to patient's coumadin clinic.   The encounter with the patient was in whole, or in part, for the following medical condition, which is the primary reason for home health care:  FTT   I certify that, based on my findings, the following services are medically necessary home health services:   Nursing Physical therapy Speech language pathology     Reason for Medically Necessary Home Health Services:  Skilled Nursing- Change/Decline in Patient Status   My clinical findings support the need for the above services:  Shortness of breath with activity   Further, I certify that my clinical findings support that this patient is homebound due to:  Shortness of Breath with activity   For home use  only DME Suction    Complete by:  As directed    Suction:  Oral   Home Health    Complete by:  As directed    Referral to palliative care when patient and family are agreeable to it.   To provide the following care/treatments:   PT OT RN Social work SLP Respiratory Care     Increase activity slowly    Complete by:  As directed      Allergies as of 05/30/2016      Reactions   Penicillins Itching, Rash, Other (See Comments)   Has patient had a PCN reaction causing immediate rash, facial/tongue/throat swelling, SOB or lightheadedness with hypotension: Yes Has patient had a PCN reaction causing severe rash involving mucus membranes or skin necrosis: Unk Has patient had a PCN reaction that required hospitalization: Unk Has patient had a PCN reaction occurring within the last 10 years: Unk If all of the above answers are "NO", then may proceed with Cephalosporin use.      Medication List    STOP taking these medications   HYDROcodone-acetaminophen 5-325 MG tablet Commonly known as:  NORCO   pantoprazole 40 MG tablet Commonly known as:  PROTONIX   polyethylene glycol powder powder Commonly known as:  GLYCOLAX/MIRALAX     TAKE these medications  acetaminophen 325 MG tablet Commonly known as:  TYLENOL Take 650 mg by mouth 2 (two) times daily as needed for mild pain.   AMINO ACIDS-PROTEIN HYDROLYS PO Take 30 mLs by mouth 2 (two) times daily.   atorvastatin 10 MG tablet Commonly known as:  LIPITOR TAKE ONE TABLET BY MOUTH ONCE DAILY What changed:  See the new instructions.   b complex vitamins tablet Take 1 tablet by mouth daily.   bacitracin-polymyxin b ophthalmic ointment Commonly known as:  POLYSPORIN Place 1 application into both eyes 2 (two) times daily. apply to eye every 12 hours while awake   benzonatate 200 MG capsule Commonly known as:  TESSALON Take 1 capsule (200 mg total) by mouth 3 (three) times daily as needed for cough.   bisacodyl 10 MG  suppository Commonly known as:  DULCOLAX Place 1 suppository (10 mg total) rectally daily as needed for moderate constipation.   bisoprolol 5 MG tablet Commonly known as:  ZEBETA Take 2 tablets (10 mg total) by mouth daily. What changed:  how much to take   calcium-vitamin D 500-200 MG-UNIT tablet Commonly known as:  OSCAL WITH D Take 1 tablet by mouth daily with breakfast.   chlorhexidine 0.12 % solution Commonly known as:  PERIDEX Use as directed 15 mLs in the mouth or throat 2 (two) times daily.   diltiazem 120 MG 24 hr capsule Commonly known as:  CARDIZEM CD Take 1 capsule (120 mg total) by mouth daily.   doxycycline 100 MG capsule Commonly known as:  VIBRAMYCIN Take 1 capsule (100 mg total) by mouth 2 (two) times daily.   feeding supplement (GLUCERNA SHAKE) Liqd Take 237 mLs by mouth See admin instructions. One to two times a day What changed:  Another medication with the same name was changed. Make sure you understand how and when to take each.   feeding supplement (OSMOLITE 1.5 CAL) Liqd Place 1,000 mLs into feeding tube daily. What changed:  Another medication with the same name was changed. Make sure you understand how and when to take each.   feeding supplement (NEPRO CARB STEADY) Liqd Take 237 mLs by mouth 2 (two) times daily between meals. What changed:  how much to take  when to take this   fluticasone 50 MCG/ACT nasal spray Commonly known as:  FLONASE Place 2 sprays into both nostrils daily.   free water Soln Place 230 mLs into feeding tube 3 (three) times daily.   furosemide 40 MG tablet Commonly known as:  LASIX Take 1 tablet (40 mg total) by mouth daily.   gabapentin 300 MG capsule Commonly known as:  NEURONTIN Take 1 capsule (300 mg total) by mouth at bedtime.   hydrALAZINE 50 MG tablet Commonly known as:  APRESOLINE Take 1 tablet (50 mg total) by mouth 3 (three) times daily.   insulin aspart 100 UNIT/ML FlexPen Commonly known as:   NOVOLOG FLEXPEN Insulin sliding scale: Blood sugar  120-150   3units                       151-200   4units                       201-250   7units                       251- 300  11units  301-350   15uints                       351-400   20units                       >400         call MD immediately   ipratropium-albuterol 0.5-2.5 (3) MG/3ML Soln Commonly known as:  DUONEB Take 3 mLs by nebulization every 6 (six) hours as needed (sob). Use 3 times daily x 4 days then every 6 hours as needed. What changed:  when to take this  additional instructions   isosorbide mononitrate 30 MG 24 hr tablet Commonly known as:  IMDUR TAKE 1 TABLET (30 MG TOTAL) BY MOUTH DAILY.   latanoprost 0.005 % ophthalmic solution Commonly known as:  XALATAN Place 1 drop into both eyes at bedtime.   levalbuterol 45 MCG/ACT inhaler Commonly known as:  XOPENEX HFA Inhale 2 puffs into the lungs every 6 (six) hours as needed for wheezing or shortness of breath.   levofloxacin 500 MG tablet Commonly known as:  LEVAQUIN Take 1 tablet (500 mg total) by mouth daily.   levothyroxine 137 MCG tablet Commonly known as:  SYNTHROID, LEVOTHROID TAKE 1 TABLET (137 MCG TOTAL) BY MOUTH DAILY BEFORE BREAKFAST.   loratadine 10 MG tablet Commonly known as:  CLARITIN Take 10 mg by mouth daily.   multivitamin with minerals Tabs tablet Take 1 tablet by mouth daily.   Polyethyl Glycol-Propyl Glycol 0.4-0.3 % Soln Place 1 drop into both eyes 4 (four) times daily.   predniSONE 10 MG tablet Commonly known as:  DELTASONE Please take 2tabs on day one, then 1 tab on day two, then half tab on day three, then stop.   RESOURCE THICKENUP CLEAR Powd Take 120 g by mouth as needed (use to thicken liquids.).   RESTASIS 0.05 % ophthalmic emulsion Generic drug:  cycloSPORINE USE 1 DROP INTO BOTH EYES TWICE DAILY   sertraline 100 MG tablet Commonly known as:  ZOLOFT TAKE ONE TABLET BY MOUTH ONCE DAILY FOR  DEPRESSION   traMADol 50 MG tablet Commonly known as:  ULTRAM Take 50 mg by mouth daily as needed for moderate pain.   warfarin 2.5 MG tablet Commonly known as:  COUMADIN Take as directed by Coumadin Clinic. What changed:  how much to take  how to take this  when to take this  additional instructions            Durable Medical Equipment        Start     Ordered   05/30/16 0000  For home use only DME Suction    Question:  Suction  Answer:  Oral   05/30/16 1243     Allergies  Allergen Reactions  . Penicillins Itching, Rash and Other (See Comments)    Has patient had a PCN reaction causing immediate rash, facial/tongue/throat swelling, SOB or lightheadedness with hypotension: Yes Has patient had a PCN reaction causing severe rash involving mucus membranes or skin necrosis: Unk Has patient had a PCN reaction that required hospitalization: Unk Has patient had a PCN reaction occurring within the last 10 years: Unk If all of the above answers are "NO", then may proceed with Cephalosporin use.    Follow-up Information    [facility] Follow up.   Why:  They will do your home health care at your home Contact information: [address] [place]  [address] [phone]        Chilton Greathouse, MD. Nyra Capes on 06/22/2016.   Specialty:  Pulmonary Disease Why:  for recurrent aspitation pneumonia, repeat chest imaging to be determined by pulmonology@4pm .  Contact information: [address] [place] [address] [phone]        Kirt Boys, DO. Go on 06/02/2016.   Specialty:  Internal Medicine Why:  hospital discharge follow up repeat cbc/bmp at follow up.@8 :45am Contact information: [address] [place] [address] [phone]        [facility] [employer]. Go on 06/27/2016.   Specialty:  Cardiology Why:  for afib and heart failure managment@9 :45am with Garnet Koyanagi information: [address]  [address] [place] [address] [phone]           The results of significant diagnostics from this hospitalization (including imaging, microbiology, ancillary and laboratory) are listed below for reference.    Significant Diagnostic Studies: Dg Chest 2 View  Result Date: 05/29/2016 CLINICAL DATA:  Pneumonia EXAM: CHEST  2 VIEW COMPARISON:  CT T 07/21/2016.  Plain film 05/22/2016 FINDINGS: Left upper lobe collapse again suspected as seen on prior chest CT, likely not significantly changed. Right lung is clear. Heart is borderline in size. No effusions or acute bony abnormality. IMPRESSION: Probable continued left upper lobe collapse. Mild cardiomegaly. Electronically Signed   By: Charlett Nose M.D.   On: 05/29/2016 13:24   Dg Chest 2 View  Result Date: 05/11/2016 CLINICAL DATA:  Dyspnea, community-acquired pneumonia, CHF, chronic renal insufficiency. EXAM: CHEST  2 VIEW COMPARISON:  Chest x-ray of May 08, 2016 FINDINGS: Since a previous study there has developed confluent alveolar opacity in the left mid and lower lung. The left hemidiaphragm is nearly obscured. The right lung is adequately inflated and clear. The pulmonary vascularity is not clearly engorged. The left heart border is now obscured. There is calcification in the wall of the aortic arch. There is chronic partial compression of the body of T11. IMPRESSION: Interval development of alveolar opacity in the left lung likely in the lingula which is not clearly visible on the lateral view this may reflect pneumonia or atelectasis. There is a new small left pleural effusion. No CHF. Thoracic aortic atherosclerosis. Electronically Signed   By: David  [place] M.D.   On: 05/11/2016 13:06   Dg Chest 2 View  Result Date: 05/08/2016 CLINICAL DATA:  Cough and shortness of breath over the last week. EXAM: CHEST  2 VIEW COMPARISON:  04/13/2016 FINDINGS: Heart size is normal. There is aortic atherosclerosis. There is newly seen  basilar volume loss, left more than right. This is consistent with mild basilar pneumonia. Upper lungs are clear. No effusions. No acute bone finding. IMPRESSION: Newly seen volume loss at both lung bases most consistent with mild atelectatic pneumonia. Aortic atherosclerosis. Electronically Signed   By: Paulina Fusi M.D.   On: 05/08/2016 13:05   Ct Chest Wo Contrast  Result Date: 05/28/2016 CLINICAL DATA:  Follow-up pneumonia. EXAM: CT CHEST WITHOUT CONTRAST TECHNIQUE: Multidetector CT imaging of the chest was performed following the standard protocol without IV contrast. COMPARISON:  05/22/2016 and prior radiographs.  07/07/2015 chest CT. FINDINGS: Cardiovascular: Cardiomegaly and coronary artery calcifications again noted. Thoracic aortic atherosclerotic calcifications again identified without aneurysm. No pericardial effusion identified. Mediastinum/Nodes: No mediastinal mass or enlarged lymph nodes. Lungs/Pleura: Left upper lobe collapse is identified without definite obstructing cause. There are several calcifications within the collapsed left upper lobe. Central ground-glass  opacities within both lungs are noted and nonspecific. This may represent edema, nonspecific inflammation or infection. Mucous filling several right lower lobe bronchi are noted. Upper Abdomen: An NG tube in the stomach is noted. No acute abnormalities identified. Musculoskeletal: No acute or suspicious abnormality. A T11 compression fracture is unchanged. IMPRESSION: Left upper lobe collapse without identifiable cause on this study. Broncholiths within the collapsed left upper lobe identified. Nonspecific bilateral central ground-glass opacities -question edema, infection or nonspecific inflammation. Cardiomegaly, coronary artery disease and thoracic atherosclerotic calcifications. Electronically Signed   By: Harmon Pier M.D.   On: 05/28/2016 07:20   Ir Gj Tube Change  Result Date: 05/16/2016 INDICATION: History of dysphagia with  chronic gastrojejunostomy catheter. Unfortunately, the patient's gastrojejunostomy catheter has become frequently clogged despite vigorous flushing following medication and enteric nutrition administration. Additionally, the gastrostomy lumen is rarely use. Please perform fluoroscopic guided exchange/conversion. EXAM: FLUOROSCOPIC GUIDED CONVERSION OF EXISTING GASTROJEJUNOSTOMY TUBE TO A JEJUNOSTOMY TUBE. COMPARISON:  Fluoroscopic guided gastrojejunostomy catheter exchange -1/30/ 2018; 02/08/2016; 01/27/2016; 01/07/2016 MEDICATIONS: None CONTRAST:  20 cc Isovue 300, administered into the enteric system. FLUOROSCOPY TIME:  7 minutes 18 seconds (89.1 mGy). COMPLICATIONS: None immediate. PROCEDURE: Despite vigorous attempted flushing an use of a stiff Amplatz wire, the jejunostomy lumen of the gastrojejunostomy catheter could not be on clogged. As such, the external portion of the gastrojejunostomy catheter was cut and the tube was removed intact. With the use of a Kumpe the catheter, a stiff Glidewire was advanced out the gastric antrum and into the proximal jejunum. Contrast injection confirmed appropriate positioning Under intermittent fluoroscopic guidance, a 22 French jejunostomy catheter was advanced through the gastrojejunostomy track with tip ultimately terminating within the proximal jejunum. The retention balloon was inflated and the balloon was cinched. Contrast was injected and several spot fluoroscopic images were obtained in various obliquities confirming intraluminal positioning. The patient tolerated the procedure well without immediate postprocedural complication. IMPRESSION: Successful fluoroscopic guided conversion of existing gastrojejunostomy catheter to a new 22-French jejunostomy tube. This conversion was performed a secondary to recurrent occlusion of the jejunostomy lumen as well as lack of frequent usage of the gastrostomy lumen. The lumen of the new 22 French jejunostomy catheter is much  larger in caliber and therefore has a decreased risk of occlusion. The new jejunostomy tube is ready for immediate use. Electronically Signed   By: Simonne Come M.D.   On: 05/16/2016 15:40   Ir Gj Tube Change  Result Date: 05/16/2016 Please refer to "Notes" to see consult details.  Ir Cm Inj Any Colonic Tube W/fluoro  Result Date: 05/02/2016 INDICATION: Feeding difficulties, catheter dislodgement. Previous GJ exchange 02/08/2016. EXAM: GI TUBE INJECTION FLUOROSCOPY TIME:  2 minutes 40 second, 25 mGy COMPLICATIONS: None immediate. PROCEDURE: Informed written consent was obtained from the patient after a thorough discussion of the procedural risks, benefits and alternatives. All questions were addressed. Sterile Technique was utilized including . A timeout was performed prior to the initiation of the procedure. Initial fluoroscopic inspection demonstrated good position of the retention balloon in the region the gastric lumen. The jejunostomy limb extends beyond the ligament of Treitz. The weighted tip appears folded back upon the catheter. Injection confirms patency of the jejunal limb. An angled stiff glidewire was advanced through the jejunal liMb to straighten the apparent kink in the distal catheter. Final injection shows good position of the catheter, no kink, tip beyond the ligament of Treitz, widely patent, okay for routine use. IMPRESSION: Dual-lumen gastrojejunostomy catheter in good position, okay for  routine use. Electronically Signed   By: Corlis Leak M.D.   On: 05/02/2016 15:26   Dg Chest Port 1 View  Result Date: 05/22/2016 CLINICAL DATA:  83 year old female with shortness of breath. Respiratory distress. Recently hospitalized for pneumonia. EXAM: PORTABLE CHEST 1 VIEW COMPARISON:  Chest radiograph 05/11/2016 FINDINGS: There has been interval improvement of the previously seen left lung base airspace opacities. Minimal residual density at the left lung base may represent atelectasis versus  residual infiltrate. There is a small left pleural effusion. The right lung is clear. There is no pneumothorax. The cardiac silhouette is within normal limits. There is atherosclerotic calcification of the aortic arch. No acute osseous pathology. IMPRESSION: Interval improvement of the left lung base opacity with near complete resolution. Small left pleural effusion with minimal left lung base atelectasis versus residual infiltrate. Clinical correlation and follow-up recommended. Electronically Signed   By: Elgie Collard M.D.   On: 05/22/2016 01:40   Dg Swallowing Func-speech Pathology  Result Date: 05/15/2016 Objective Swallowing Evaluation: Type of Study: MBS-Modified Barium Swallow Study Patient Details Name: Kealoha Hurtgen MRN: [id number] Date of Birth: [birth year] Today's Date: 05/15/2016 Time: SLP Start Time (ACUTE ONLY): 0955-SLP Stop Time (ACUTE ONLY): 1014 SLP Time Calculation (min) (ACUTE ONLY): 19 min Past Medical History: Past Medical History: Diagnosis Date . Anemia   previous blood transfusions . Arthritis   "all over" . Asthma  . Bradycardia   requiring previous d/c of BB and reduction of amiodarone . CAD (coronary artery disease)   nonobstructive per notes . Chronic diastolic CHF (congestive heart failure) (HCC)  . CKD (chronic kidney disease), stage III  . Complication of blood transfusion   "got the wrong blood type at [facility] in ~ 2015; no adverse reaction that we are aware of"/daughter, [name] (01/27/2016) . COPD (chronic obstructive pulmonary disease) (HCC)  . Depression   "light case" . DVT (deep venous thrombosis) (HCC) 01/2016  a. LLE DVT 01/2016 - switched from Eliquis to Coumadin. . Gastric stenosis   a. s/p stomach tube . GERD (gastroesophageal reflux disease)  . History of blood transfusion   "several" (01/27/2016) . History of stomach ulcers  . Hyperlipidemia  . Hypertension  . Hypothyroidism  . Paraesophageal hernia  . Perforated gastric ulcer (HCC)  . Seasonal allergies  . SIADH  (syndrome of inappropriate ADH production) (HCC)   [name] 01/10/2015 . Small bowel obstruction   "I don't know how many" (01/11/2015) . Stroke (HCC)   "light one" . Type II diabetes mellitus (HCC)   "related to prednisone use  for > 20 yr; once predinose stopped; no more DM RX" (01/27/2016) . Ventral hernia with bowel obstruction  Past Surgical History: Past Surgical History: Procedure Laterality Date . CATARACT EXTRACTION W/ INTRAOCULAR LENS  IMPLANT, BILATERAL   . CHOLECYSTECTOMY OPEN   . COLECTOMY   . ESOPHAGOGASTRODUODENOSCOPY N/A 01/19/2014  Procedure: ESOPHAGOGASTRODUODENOSCOPY (EGD);  Surgeon: Hilarie Fredrickson, MD;  Location: [facility];  Service: Endoscopy;  Laterality: N/A; . ESOPHAGOGASTRODUODENOSCOPY N/A 01/20/2014  Procedure: ESOPHAGOGASTRODUODENOSCOPY (EGD);  Surgeon: Hilarie Fredrickson, MD;  Location: [facility];  Service: Endoscopy;  Laterality: N/A; . ESOPHAGOGASTRODUODENOSCOPY N/A 03/19/2014  Procedure: ESOPHAGOGASTRODUODENOSCOPY (EGD);  Surgeon: Rachael Fee, MD;  Location: [facility];  Service: Endoscopy;  Laterality: N/A; . ESOPHAGOGASTRODUODENOSCOPY N/A 07/08/2015  Procedure: ESOPHAGOGASTRODUODENOSCOPY (EGD);  Surgeon: Sherrilyn Rist, MD;  Location: [facility] ENDOSCOPY;  Service: Endoscopy;  Laterality: N/A; . ESOPHAGOGASTRODUODENOSCOPY (EGD) WITH PROPOFOL N/A 09/15/2015  Procedure: ESOPHAGOGASTRODUODENOSCOPY (EGD) WITH PROPOFOL;  Surgeon: Reeves Forth  Adela Lank, MD;  Location: [facility];  Service: Gastroenterology;  Laterality: N/A; . GASTROJEJUNOSTOMY    hx/notes 01/10/2015 . GASTROJEJUNOSTOMY N/A 09/23/2015  Procedure: OPEN GASTROJEJUNOSTOMY TUBE PLACEMENT;  Surgeon: De Blanch Kinsinger, MD;  Location: [facility] ORS;  Service: General;  Laterality: N/A; . GLAUCOMA SURGERY Bilateral  . HERNIA REPAIR  2015 . IR GENERIC HISTORICAL  01/07/2016  IR GJ TUBE CHANGE 01/07/2016 Malachy Moan, MD [facility] . IR GENERIC HISTORICAL  01/27/2016  IR MECH REMOV OBSTRUC MAT ANY COLON TUBE W/FLUORO 01/27/2016 Richarda Overlie, MD [facility] . IR GENERIC HISTORICAL  02/07/2016  IR PATIENT EVAL TECH 0-60 MINS Darrell K Allred, PA-C [facility] . IR GENERIC HISTORICAL  02/08/2016  IR GJ TUBE CHANGE 02/08/2016 Berdine Dance, MD [facility] . IR GENERIC HISTORICAL  01/06/2016  IR GJ TUBE CHANGE 01/06/2016 [facility] OUT REF . IR GENERIC HISTORICAL  05/02/2016  IR CM INJ ANY COLONIC TUBE W/FLUORO 05/02/2016 Oley Balm, MD [facility] . LAPAROTOMY N/A 01/20/2015  Procedure: EXPLORATORY LAPAROTOMY;  Surgeon: Abigail Miyamoto, MD;  Location: [facility] OR;  Service: General;  Laterality: N/A; . LYSIS OF ADHESION N/A 01/20/2015  Procedure: LYSIS OF ADHESIONS < 1 HOUR;  Surgeon: Abigail Miyamoto, MD;  Location: [facility] OR;  Service: General;  Laterality: N/A; . TONSILLECTOMY   . TUBAL LIGATION   . VENTRAL HERNIA REPAIR  2015  incarcerated ventral hernia ([facility] 09/2013)/notes 01/10/2015 HPI: 83 y.o. female with medical history significant of persistent atrial fibrillation on amiodarone, bradycardia, CKD stage III, hypothyroidism, non-obstructive coronary artery disease, type 2 diabetes, hypertension, chronic diastolic CHF, stroke, cerebrovascular disease, COPD, asthma, gastric outlet obstruction s/p prior J tube, anemia and hyponatremia, presented with shortness of breath and cough for worsening for about 2 weeks. Admitted with possible pneumonia, hypoxic respiratory failure. At baseline consumes liquid diet thickened to nectar consistency with supplemental nutrition via J tube. Seen by SLP during multiple prior admissions. Pt had a MBS on 11/09/15 indicating penetration of thin liquids to level of VFs which pt was able to sense and clear most of material. Also noted prominence at CP segment. Recommendation at that time was nectar thick liquids/ full liquid diet although there were pharyngeal residuals on this consistency. During most recent admission 03/2016, recommendations were thin liquids (full liquids), medications whole with liquid. Instrumental testing  declined by patient at that time. Chest x-ray 05/11/16 showed Interval development of alveolar opacity in the left lung likely in the lingula which may reflect pneumonia or atelectasis, new small left pleural effusion. Subjective: Pt semi-upright in bed, consuming clear liquids from tray which she thickens herself to nectar consistency Assessment / Plan / Recommendation [facility] IP CLINICAL IMPRESSIONS 05/15/2016 Therapy Diagnosis Mild oral phase dysphagia;Mild pharyngeal phase dysphagia Clinical Impression Patient presents with mild oropharyngeal dysphagia characterized by delayed swallow initiation resulting in silent aspiration of thin liquids. Additionally, patient with mild vallecular residuals post swallow due to base of tongue weakness leading to one episode of silent aspiration of residuals post swallow. Use of chin tuck did prevent penetration/aspiration of thin liquids during todays study. Would consider upgrade to thin liquids once patient can complete chin tuck consistently. Note that long h/o esophageal dysphagia does also increase aspiration risk. Will continue to f/u.  Impact on safety and function Moderate aspiration risk   [facility] IP TREATMENT RECOMMENDATION 05/15/2016 Treatment Recommendations Therapy as outlined in treatment plan below   Prognosis 05/15/2016 Prognosis for Safe Diet Advancement Good Barriers to Reach Goals Time post onset Barriers/Prognosis Comment -- [facility] IP DIET  RECOMMENDATION 05/15/2016 SLP Diet Recommendations Nectar thick liquid Liquid Administration via Cup;No straw Medication Administration Crushed with puree Compensations Slow rate;Small sips/bites Postural Changes Seated upright at 90 degrees;Remain semi-upright after after feeds/meals (Comment)   [facility] IP OTHER RECOMMENDATIONS 05/15/2016 Recommended Consults -- Oral Care Recommendations Oral care BID Other Recommendations Order thickener from pharmacy;Prohibited food (jello, ice cream, thin soups)   [facility] IP FOLLOW UP RECOMMENDATIONS  05/15/2016 Follow up Recommendations Skilled Nursing facility   [facility] IP FREQUENCY AND DURATION 05/15/2016 Speech Therapy Frequency (ACUTE ONLY) min 2x/week Treatment Duration 2 weeks      [facility] IP ORAL PHASE 05/15/2016 Oral Phase WFL Oral - Pudding Teaspoon -- Oral - Pudding Cup -- Oral - Honey Teaspoon -- Oral - Honey Cup -- Oral - Nectar Teaspoon -- Oral - Nectar Cup -- Oral - Nectar Straw -- Oral - Thin Teaspoon -- Oral - Thin Cup -- Oral - Thin Straw -- Oral - Puree -- Oral - Mech Soft -- Oral - Regular -- Oral - Multi-Consistency -- Oral - Pill -- Oral Phase - Comment --  [facility] IP PHARYNGEAL PHASE 05/15/2016 Pharyngeal Phase Impaired Pharyngeal- Pudding Teaspoon -- Pharyngeal -- Pharyngeal- Pudding Cup -- Pharyngeal -- Pharyngeal- Honey Teaspoon -- Pharyngeal -- Pharyngeal- Honey Cup -- Pharyngeal -- Pharyngeal- Nectar Teaspoon Delayed swallow initiation-vallecula;Reduced tongue base retraction;Pharyngeal residue - valleculae Pharyngeal -- Pharyngeal- Nectar Cup Delayed swallow initiation-vallecula;Reduced tongue base retraction;Pharyngeal residue - valleculae Pharyngeal -- Pharyngeal- Nectar Straw -- Pharyngeal -- Pharyngeal- Thin Teaspoon Delayed swallow initiation-vallecula;Reduced tongue base retraction;Pharyngeal residue - valleculae Pharyngeal -- Pharyngeal- Thin Cup Delayed swallow initiation-vallecula;Reduced tongue base retraction;Pharyngeal residue - valleculae;Delayed swallow initiation-pyriform sinuses;Penetration/Aspiration before swallow;Penetration/Apiration after swallow Pharyngeal Material enters airway, passes BELOW cords without attempt by patient to eject out (silent aspiration) Pharyngeal- Thin Straw Reduced tongue base retraction;Pharyngeal residue - valleculae;Delayed swallow initiation-pyriform sinuses;Delayed swallow initiation-vallecula;Penetration/Aspiration before swallow Pharyngeal Material enters airway, passes BELOW cords without attempt by patient to eject out (silent aspiration)  Pharyngeal- Puree Delayed swallow initiation-vallecula;Reduced tongue base retraction;Pharyngeal residue - valleculae Pharyngeal -- Pharyngeal- Mechanical Soft -- Pharyngeal -- Pharyngeal- Regular -- Pharyngeal -- Pharyngeal- Multi-consistency -- Pharyngeal -- Pharyngeal- Pill Pharyngeal residue - valleculae;Reduced tongue base retraction Pharyngeal -- Pharyngeal Comment --  [facility] IP CERVICAL ESOPHAGEAL PHASE 05/15/2016 Cervical Esophageal Phase Impaired Pudding Teaspoon -- Pudding Cup -- Honey Teaspoon -- Honey Cup -- Nectar Teaspoon -- Nectar Cup -- Nectar Straw -- Thin Teaspoon -- Thin Cup -- Thin Straw -- Puree -- Mechanical Soft -- Regular -- Multi-consistency -- Pill -- Cervical Esophageal Comment -- [facility] IP GO 11/09/2015 Functional Assessment Tool Used clinical judgment Functional Limitations Swallowing Swallow Current Status (O9629) CJ Swallow Goal Status (B2841) CI Swallow Discharge Status (L2440) (None) Motor Speech Current Status (N0272) (None) Motor Speech Goal Status (Z3664) (None) Motor Speech Goal Status (Q0347) (None) Spoken Language Comprehension Current Status (Q2595) (None) Spoken Language Comprehension Goal Status (G3875) (None) Spoken Language Comprehension Discharge Status (I4332) (None) Spoken Language Expression Current Status (R5188) (None) Spoken Language Expression Goal Status (C1660) (None) Spoken Language Expression Discharge Status [phone] (None) Attention Current Status (W1093) (None) Attention Goal Status (A3557) (None) Attention Discharge Status (D2202) (None) Memory Current Status (R4270) (None) Memory Goal Status (W2376) (None) Memory Discharge Status (E8315) (None) Voice Current Status (V7616) (None) Voice Goal Status (W7371) (None) Voice Discharge Status (G6269) (None) Other Speech-Language Pathology Functional Limitation (S8546) (None) Other Speech-Language Pathology Functional Limitation Goal Status (E7035) (None) Other Speech-Language Pathology Functional Limitation Discharge Status  [phone]) (None) Ferdinand Lango MA, CCC-SLP [phone] McCoy Tacey Ruiz  Meryl 05/15/2016, 10:45 AM               Microbiology: Recent Results (from the past 240 hour(s))  Blood Culture (routine x 2)     Status: None   Collection Time: 05/22/16  1:01 AM  Result Value Ref Range Status   Specimen Description BLOOD RIGHT FOREARM  Final   Special Requests BOTTLES DRAWN AEROBIC AND ANAEROBIC  Final   Culture NO GROWTH 5 DAYS  Final   Report Status 05/27/2016 FINAL  Final  Urine culture     Status: None   Collection Time: 05/22/16  1:55 AM  Result Value Ref Range Status   Specimen Description URINE, CLEAN CATCH  Final   Special Requests NONE  Final   Culture NO GROWTH  Final   Report Status 05/23/2016 FINAL  Final  MRSA PCR Screening     Status: Abnormal   Collection Time: 05/22/16  4:27 AM  Result Value Ref Range Status   MRSA by PCR POSITIVE (A) NEGATIVE Final    Comment:        The GeneXpert MRSA Assay (FDA approved for NASAL specimens only), is one component of a comprehensive MRSA colonization surveillance program. It is not intended to diagnose MRSA infection nor to guide or monitor treatment for MRSA infections. RESULT CALLED TO, READ BACK BY AND VERIFIED WITH: T.SHELTON RN AT 0805 05/22/16 BY A.DAVIS   Blood Culture (routine x 2)     Status: None   Collection Time: 05/22/16  4:51 AM  Result Value Ref Range Status   Specimen Description BLOOD RIGHT HAND  Final   Special Requests IN PEDIATRIC BOTTLE  Final   Culture NO GROWTH 5 DAYS  Final   Report Status 05/27/2016 FINAL  Final  Respiratory Panel by PCR     Status: None   Collection Time: 05/22/16 10:30 AM  Result Value Ref Range Status   Adenovirus NOT DETECTED NOT DETECTED Final   Coronavirus 229E NOT DETECTED NOT DETECTED Final   Coronavirus HKU1 NOT DETECTED NOT DETECTED Final   Coronavirus NL63 NOT DETECTED NOT DETECTED Final   Coronavirus OC43 NOT DETECTED NOT DETECTED Final   Metapneumovirus NOT DETECTED NOT  DETECTED Final   Rhinovirus / Enterovirus NOT DETECTED NOT DETECTED Final   Influenza A NOT DETECTED NOT DETECTED Final   Influenza B NOT DETECTED NOT DETECTED Final   Parainfluenza Virus 1 NOT DETECTED NOT DETECTED Final   Parainfluenza Virus 2 NOT DETECTED NOT DETECTED Final   Parainfluenza Virus 3 NOT DETECTED NOT DETECTED Final   Parainfluenza Virus 4 NOT DETECTED NOT DETECTED Final   Respiratory Syncytial Virus NOT DETECTED NOT DETECTED Final   Bordetella pertussis NOT DETECTED NOT DETECTED Final   Chlamydophila pneumoniae NOT DETECTED NOT DETECTED Final   Mycoplasma pneumoniae NOT DETECTED NOT DETECTED Final     Labs: Basic Metabolic Panel:  Recent Labs Lab 05/26/16 0458 05/27/16 0443 05/28/16 0519 05/29/16 0527 05/30/16 0530  NA 135 140 135 136 137  K 3.9 3.5 3.8 3.9 3.8  CL 97* 101 100* 100* 101  CO2 25 27 25 29 28   GLUCOSE 388* 374* 345* 304* 279*  BUN 54* 47* 45* 43* 43*  CREATININE 1.14* 1.11* 1.11* 1.08* 1.12*  CALCIUM 9.4 9.5 9.1 8.8* 8.8*  MG  --  2.2 2.0  --   --    Liver Function Tests:  Recent Labs Lab 05/28/16 0519  AST 32  ALT 52  ALKPHOS 72  BILITOT 0.3  PROT 6.1*  ALBUMIN 2.8*   No results for input(s): LIPASE, AMYLASE in the last 168 hours. No results for input(s): AMMONIA in the last 168 hours. CBC:  Recent Labs Lab 05/26/16 0458 05/27/16 0443 05/28/16 0519 05/29/16 0527 05/30/16 0530  WBC 15.3* 14.4* 13.6* 12.8* 12.6*  NEUTROABS  --   --  11.8*  --  9.9*  HGB 10.8* 10.9* 10.7* 10.2* 10.2*  HCT 32.4* 32.2* 32.0* 31.0* 30.5*  MCV 83.1 82.8 82.7 82.9 83.6  PLT 417* 390 365 346 327   Cardiac Enzymes: No results for input(s): CKTOTAL, CKMB, CKMBINDEX, TROPONINI in the last 168 hours. BNP: BNP (last 3 results)  Recent Labs  03/02/16 1210 05/08/16 1635 05/22/16 0102  BNP 515.7* 80.9 271.0*    ProBNP (last 3 results)  Recent Labs  05/08/16 1053  PROBNP 1,132*    CBG:  Recent Labs Lab 05/29/16 1135 05/29/16 1643  05/29/16 2158 05/30/16 0743 05/30/16 1148  GLUCAP 106* 270* 247* 281* 94       Signed:  Katherinne Mofield MD, PhD  [facility] 05/30/2016, 12:44 PM

## 2016-05-30 NOTE — Progress Notes (Signed)
Portable suction machine ordered as requested and to be delivered to the patient's room today prior to discharging home; B Woodman RN,MHA,BSN [phone]

## 2016-05-31 ENCOUNTER — Telehealth: Payer: Self-pay

## 2016-05-31 DIAGNOSIS — E1122 Type 2 diabetes mellitus with diabetic chronic kidney disease: Secondary | ICD-10-CM | POA: Diagnosis not present

## 2016-05-31 DIAGNOSIS — J189 Pneumonia, unspecified organism: Secondary | ICD-10-CM | POA: Diagnosis not present

## 2016-05-31 DIAGNOSIS — N183 Chronic kidney disease, stage 3 (moderate): Secondary | ICD-10-CM | POA: Diagnosis not present

## 2016-05-31 DIAGNOSIS — I2510 Atherosclerotic heart disease of native coronary artery without angina pectoris: Secondary | ICD-10-CM | POA: Diagnosis not present

## 2016-05-31 DIAGNOSIS — I130 Hypertensive heart and chronic kidney disease with heart failure and stage 1 through stage 4 chronic kidney disease, or unspecified chronic kidney disease: Secondary | ICD-10-CM | POA: Diagnosis not present

## 2016-05-31 DIAGNOSIS — I5033 Acute on chronic diastolic (congestive) heart failure: Secondary | ICD-10-CM | POA: Diagnosis not present

## 2016-05-31 LAB — HEMOGLOBIN A1C
Hgb A1c MFr Bld: 8 % — ABNORMAL HIGH (ref 4.8–5.6)
Mean Plasma Glucose: 183 mg/dL

## 2016-05-31 NOTE — Telephone Encounter (Signed)
I have made the 1st attempt to contact the patient or family member in charge, in order to follow up from recently being discharged from the hospital. I left a message on voicemail but I will make another attempt at a different time. AM 05/31/16 @12 :13pm.

## 2016-06-02 ENCOUNTER — Ambulatory Visit (INDEPENDENT_AMBULATORY_CARE_PROVIDER_SITE_OTHER): Payer: Medicare Other | Admitting: Internal Medicine

## 2016-06-02 ENCOUNTER — Encounter: Payer: Self-pay | Admitting: Internal Medicine

## 2016-06-02 VITALS — BP 184/84 | HR 60 | Temp 97.8°F | Ht 66.0 in | Wt 139.4 lb

## 2016-06-02 DIAGNOSIS — J690 Pneumonitis due to inhalation of food and vomit: Secondary | ICD-10-CM

## 2016-06-02 DIAGNOSIS — I130 Hypertensive heart and chronic kidney disease with heart failure and stage 1 through stage 4 chronic kidney disease, or unspecified chronic kidney disease: Secondary | ICD-10-CM | POA: Diagnosis not present

## 2016-06-02 DIAGNOSIS — I82542 Chronic embolism and thrombosis of left tibial vein: Secondary | ICD-10-CM | POA: Diagnosis not present

## 2016-06-02 DIAGNOSIS — I2510 Atherosclerotic heart disease of native coronary artery without angina pectoris: Secondary | ICD-10-CM | POA: Diagnosis not present

## 2016-06-02 DIAGNOSIS — Z794 Long term (current) use of insulin: Secondary | ICD-10-CM

## 2016-06-02 DIAGNOSIS — Z931 Gastrostomy status: Secondary | ICD-10-CM | POA: Diagnosis not present

## 2016-06-02 DIAGNOSIS — N183 Chronic kidney disease, stage 3 unspecified: Secondary | ICD-10-CM

## 2016-06-02 DIAGNOSIS — I480 Paroxysmal atrial fibrillation: Secondary | ICD-10-CM | POA: Diagnosis not present

## 2016-06-02 DIAGNOSIS — J45909 Unspecified asthma, uncomplicated: Secondary | ICD-10-CM | POA: Diagnosis not present

## 2016-06-02 DIAGNOSIS — I10 Essential (primary) hypertension: Secondary | ICD-10-CM | POA: Diagnosis not present

## 2016-06-02 DIAGNOSIS — I5033 Acute on chronic diastolic (congestive) heart failure: Secondary | ICD-10-CM | POA: Diagnosis not present

## 2016-06-02 DIAGNOSIS — I5032 Chronic diastolic (congestive) heart failure: Secondary | ICD-10-CM

## 2016-06-02 DIAGNOSIS — E1122 Type 2 diabetes mellitus with diabetic chronic kidney disease: Secondary | ICD-10-CM | POA: Diagnosis not present

## 2016-06-02 DIAGNOSIS — J189 Pneumonia, unspecified organism: Secondary | ICD-10-CM | POA: Diagnosis not present

## 2016-06-02 MED ORDER — INSULIN GLARGINE 100 UNIT/ML SOLOSTAR PEN: 5.0000 [IU] | PEN_INJECTOR | Freq: Every day | SUBCUTANEOUS | 6 refills | Status: DC

## 2016-06-02 MED ORDER — INSULIN PEN NEEDLE 31G X 5 MM MISC: 1.0000 [IU] | Freq: Every day | 6 refills | Status: DC

## 2016-06-02 MED ORDER — IPRATROPIUM-ALBUTEROL 0.5-2.5 (3) MG/3ML IN SOLN
3.0000 mL | Freq: Once | RESPIRATORY_TRACT | Status: AC
Start: 2016-06-02 — End: 2016-06-02
  Administered 2016-06-02: 3 mL via RESPIRATORY_TRACT

## 2016-06-02 NOTE — Patient Instructions (Addendum)
STOP  SLIDING SCALE INSULIN  START LANTUS 5 UNITS INJECTION AT BEDTIME  Finish antibiotic and prednisone  Continue nebulizer/breathing treatment as ordered. Use 3 times a day for 4 days then use as needed every 6 hours for shortness of breath, wheezing or cough  Do not recommend suction apparatus as you have a good cough and able to spit out mucous  Continue other medications as ordered  Follow up in 3 mos for COPD,CHF

## 2016-06-02 NOTE — Progress Notes (Signed)
Patient ID: Shannon [name], female   DOB: 1924-02-01, 83 y.o.   MRN: [id number]    Location:  [facility] Place of Service: OFFICE  Chief Complaint  Patient presents with  . Hospitalization Follow-up    admitted on 05/22/2016- 05/30/2016 for shortness of breath    HPI:  83 yo female seen today for hospital f/u of acute respiratory failure with hypoxia, aspiration pneumonitis/HCAP, chronic dHF, hypothyroidism, persistent afib, hyponatremia, PEG status 2/2 gastric outlet obstruction. She was given BiPAP then weaned off. RA sats 97%. Respiratory, urine and blood cx neg. CT chest showed "Left upper lobe collapse without identifiable cause on this study. Broncholiths within the collapsed left upper lobe identified.Nonspecific bilateral central ground-glass opacities -question edema, infection or nonspecific inflammation". Pulmonary consulted. Initially rec'd IV cefepime and vanco but was stopped on 2/21. abx resumed after lactic acid remained elevated. She was d/c'd on levaquin and doxy. EF 60% by 2D echo. She was d/c with [facility] PT/OT/ST/RN/SW/respiratory   Today she reports feeling better. She has SOB but improved. She is receiving HH. She attempts to follow hospital d/c instructions as written  She has seasonal allergy sx's and takes claritin daily and has flonase. She takes advair for asthma  No seizures since last OV. Takes gabapentin  HTN/afib/aflutter/hyperlipidemia/chronic dHF - BP controlled on hydralazine, imdur. Cholesterol stable on lipitor. Rate controlled on amiodarone and cardizem. She takes lasix. She takes coumadin for anticoagulation. INR Followed by cardio  GOO - s/p GJ tube. Takes Protonix BID. Constipation stable on lactulose. She gets TF at night. Drinks liquids po. Takes zofran prn  Hyponatremia/ hx SIADH- she was taking salt tabs but over the last several weeks has noticed the entire tab in her stool intermittently. Na 131  DM - diet controlled. A1c 7.4%. Takes gabapentin for  neuropathy  Hx CVA - stable. Takes ASA daily  Depression - mood stable on sertraline  Arthritis - joint pain stable on tramadol, norco.  Hypothyroidism - takes levothyroxine 137 mcg daily. TSH 5.642  Pressure sore/right 3rd toe osteomyelitis - completed 1 month of augmentin. Followed by podiatry. She has PAD  COPD - takes advair 250/50, proventil hfa, duonebs. Followed by pulmonary. No recent exacerbations  Past Medical History:  Diagnosis Date  . Anemia    previous blood transfusions  . Arthritis    "all over"  . Asthma   . Bradycardia    requiring previous d/c of BB and reduction of amiodarone  . CAD (coronary artery disease)    nonobstructive per notes  . Chronic diastolic CHF (congestive heart failure) ([place])   . CKD (chronic kidney disease), stage III   . Complication of blood transfusion    "got the wrong blood type at [facility] in ~ 2015; no adverse reaction that we are aware of"/daughter, [name] (01/27/2016)  . COPD (chronic obstructive pulmonary disease) ([place])   . Depression    "light case"  . DVT (deep venous thrombosis) ([place]) 01/2016   a. LLE DVT 01/2016 - switched from Eliquis to Coumadin.  . Gastric stenosis    a. s/p stomach tube  . GERD (gastroesophageal reflux disease)   . History of blood transfusion    "several" (01/27/2016)  . History of stomach ulcers   . Hyperlipidemia   . Hypertension   . Hypothyroidism   . Paraesophageal hernia   . Perforated gastric ulcer ([place])   . Seasonal allergies   . SIADH (syndrome of inappropriate ADH production) ([place])    [name] 01/10/2015  . Small bowel  On: 05/28/2016 07:20   Ir Gj Tube Change  Result Date: 05/16/2016 INDICATION: History of dysphagia with chronic gastrojejunostomy catheter. Unfortunately, the patient's gastrojejunostomy catheter has become frequently clogged despite vigorous flushing following medication and enteric nutrition administration. Additionally, the gastrostomy lumen is rarely use. Please perform fluoroscopic guided exchange/conversion. EXAM: FLUOROSCOPIC GUIDED CONVERSION OF EXISTING GASTROJEJUNOSTOMY TUBE TO A JEJUNOSTOMY TUBE. COMPARISON:  Fluoroscopic guided gastrojejunostomy catheter exchange -1/30/ 2018; 02/08/2016; 01/27/2016; 01/07/2016 MEDICATIONS: None CONTRAST:  20 cc Isovue 300, administered into the enteric system. FLUOROSCOPY TIME:   7 minutes 18 seconds (89.1 mGy). COMPLICATIONS: None immediate. PROCEDURE: Despite vigorous attempted flushing an use of a stiff Amplatz wire, the jejunostomy lumen of the gastrojejunostomy catheter could not be on clogged. As such, the external portion of the gastrojejunostomy catheter was cut and the tube was removed intact. With the use of a Kumpe the catheter, a stiff Glidewire was advanced out the gastric antrum and into the proximal jejunum. Contrast injection confirmed appropriate positioning Under intermittent fluoroscopic guidance, a 22 French jejunostomy catheter was advanced through the gastrojejunostomy track with tip ultimately terminating within the proximal jejunum. The retention balloon was inflated and the balloon was cinched. Contrast was injected and several spot fluoroscopic images were obtained in various obliquities confirming intraluminal positioning. The patient tolerated the procedure well without immediate postprocedural complication. IMPRESSION: Successful fluoroscopic guided conversion of existing gastrojejunostomy catheter to a new 22-French jejunostomy tube. This conversion was performed a secondary to recurrent occlusion of the jejunostomy lumen as well as lack of frequent usage of the gastrostomy lumen. The lumen of the new 22 French jejunostomy catheter is much larger in caliber and therefore has a decreased risk of occlusion. The new jejunostomy tube is ready for immediate use. Electronically Signed   By: Sandi Mariscal M.D.   On: 05/16/2016 15:40   Ir Gj Tube Change  Result Date: 05/16/2016 Please refer to "Notes" to see consult details.  Dg Chest Port 1 View  Result Date: 05/22/2016 CLINICAL DATA:  83 year old female with shortness of breath. Respiratory distress. Recently hospitalized for pneumonia. EXAM: PORTABLE CHEST 1 VIEW COMPARISON:  Chest radiograph 05/11/2016 FINDINGS: There has been interval improvement of the previously seen left lung base airspace opacities.  Minimal residual density at the left lung base may represent atelectasis versus residual infiltrate. There is a small left pleural effusion. The right lung is clear. There is no pneumothorax. The cardiac silhouette is within normal limits. There is atherosclerotic calcification of the aortic arch. No acute osseous pathology. IMPRESSION: Interval improvement of the left lung base opacity with near complete resolution. Small left pleural effusion with minimal left lung base atelectasis versus residual infiltrate. Clinical correlation and follow-up recommended. Electronically Signed   By: Anner Crete M.D.   On: 05/22/2016 01:40   Dg Swallowing Func-speech Pathology  Result Date: 05/15/2016 Objective Swallowing Evaluation: Type of Study: MBS-Modified Barium Swallow Study Patient Details Name: Lia Vigilante MRN: [id number] Date of Birth: [birth year] Today's Date: 05/15/2016 Time: SLP Start Time (ACUTE ONLY): 0955-SLP Stop Time (ACUTE ONLY): 1014 SLP Time Calculation (min) (ACUTE ONLY): 19 min Past Medical History: Past Medical History: Diagnosis Date . Anemia   previous blood transfusions . Arthritis   "all over" . Asthma  . Bradycardia   requiring previous d/c of BB and reduction of amiodarone . CAD (coronary artery disease)   nonobstructive per notes . Chronic diastolic CHF (congestive heart failure) ([place])  . CKD (chronic kidney disease), stage III  . Complication of blood transfusion   "  Patient ID: Shannon [name], female   DOB: 1924-02-01, 83 y.o.   MRN: [id number]    Location:  [facility] Place of Service: OFFICE  Chief Complaint  Patient presents with  . Hospitalization Follow-up    admitted on 05/22/2016- 05/30/2016 for shortness of breath    HPI:  83 yo female seen today for hospital f/u of acute respiratory failure with hypoxia, aspiration pneumonitis/HCAP, chronic dHF, hypothyroidism, persistent afib, hyponatremia, PEG status 2/2 gastric outlet obstruction. She was given BiPAP then weaned off. RA sats 97%. Respiratory, urine and blood cx neg. CT chest showed "Left upper lobe collapse without identifiable cause on this study. Broncholiths within the collapsed left upper lobe identified.Nonspecific bilateral central ground-glass opacities -question edema, infection or nonspecific inflammation". Pulmonary consulted. Initially rec'd IV cefepime and vanco but was stopped on 2/21. abx resumed after lactic acid remained elevated. She was d/c'd on levaquin and doxy. EF 60% by 2D echo. She was d/c with [facility] PT/OT/ST/RN/SW/respiratory   Today she reports feeling better. She has SOB but improved. She is receiving HH. She attempts to follow hospital d/c instructions as written  She has seasonal allergy sx's and takes claritin daily and has flonase. She takes advair for asthma  No seizures since last OV. Takes gabapentin  HTN/afib/aflutter/hyperlipidemia/chronic dHF - BP controlled on hydralazine, imdur. Cholesterol stable on lipitor. Rate controlled on amiodarone and cardizem. She takes lasix. She takes coumadin for anticoagulation. INR Followed by cardio  GOO - s/p GJ tube. Takes Protonix BID. Constipation stable on lactulose. She gets TF at night. Drinks liquids po. Takes zofran prn  Hyponatremia/ hx SIADH- she was taking salt tabs but over the last several weeks has noticed the entire tab in her stool intermittently. Na 131  DM - diet controlled. A1c 7.4%. Takes gabapentin for  neuropathy  Hx CVA - stable. Takes ASA daily  Depression - mood stable on sertraline  Arthritis - joint pain stable on tramadol, norco.  Hypothyroidism - takes levothyroxine 137 mcg daily. TSH 5.642  Pressure sore/right 3rd toe osteomyelitis - completed 1 month of augmentin. Followed by podiatry. She has PAD  COPD - takes advair 250/50, proventil hfa, duonebs. Followed by pulmonary. No recent exacerbations  Past Medical History:  Diagnosis Date  . Anemia    previous blood transfusions  . Arthritis    "all over"  . Asthma   . Bradycardia    requiring previous d/c of BB and reduction of amiodarone  . CAD (coronary artery disease)    nonobstructive per notes  . Chronic diastolic CHF (congestive heart failure) ([place])   . CKD (chronic kidney disease), stage III   . Complication of blood transfusion    "got the wrong blood type at [facility] in ~ 2015; no adverse reaction that we are aware of"/daughter, [name] (01/27/2016)  . COPD (chronic obstructive pulmonary disease) ([place])   . Depression    "light case"  . DVT (deep venous thrombosis) ([place]) 01/2016   a. LLE DVT 01/2016 - switched from Eliquis to Coumadin.  . Gastric stenosis    a. s/p stomach tube  . GERD (gastroesophageal reflux disease)   . History of blood transfusion    "several" (01/27/2016)  . History of stomach ulcers   . Hyperlipidemia   . Hypertension   . Hypothyroidism   . Paraesophageal hernia   . Perforated gastric ulcer ([place])   . Seasonal allergies   . SIADH (syndrome of inappropriate ADH production) ([place])    [name] 01/10/2015  . Small bowel  Patient ID: Shannon [name], female   DOB: 1924-02-01, 83 y.o.   MRN: [id number]    Location:  [facility] Place of Service: OFFICE  Chief Complaint  Patient presents with  . Hospitalization Follow-up    admitted on 05/22/2016- 05/30/2016 for shortness of breath    HPI:  83 yo female seen today for hospital f/u of acute respiratory failure with hypoxia, aspiration pneumonitis/HCAP, chronic dHF, hypothyroidism, persistent afib, hyponatremia, PEG status 2/2 gastric outlet obstruction. She was given BiPAP then weaned off. RA sats 97%. Respiratory, urine and blood cx neg. CT chest showed "Left upper lobe collapse without identifiable cause on this study. Broncholiths within the collapsed left upper lobe identified.Nonspecific bilateral central ground-glass opacities -question edema, infection or nonspecific inflammation". Pulmonary consulted. Initially rec'd IV cefepime and vanco but was stopped on 2/21. abx resumed after lactic acid remained elevated. She was d/c'd on levaquin and doxy. EF 60% by 2D echo. She was d/c with [facility] PT/OT/ST/RN/SW/respiratory   Today she reports feeling better. She has SOB but improved. She is receiving HH. She attempts to follow hospital d/c instructions as written  She has seasonal allergy sx's and takes claritin daily and has flonase. She takes advair for asthma  No seizures since last OV. Takes gabapentin  HTN/afib/aflutter/hyperlipidemia/chronic dHF - BP controlled on hydralazine, imdur. Cholesterol stable on lipitor. Rate controlled on amiodarone and cardizem. She takes lasix. She takes coumadin for anticoagulation. INR Followed by cardio  GOO - s/p GJ tube. Takes Protonix BID. Constipation stable on lactulose. She gets TF at night. Drinks liquids po. Takes zofran prn  Hyponatremia/ hx SIADH- she was taking salt tabs but over the last several weeks has noticed the entire tab in her stool intermittently. Na 131  DM - diet controlled. A1c 7.4%. Takes gabapentin for  neuropathy  Hx CVA - stable. Takes ASA daily  Depression - mood stable on sertraline  Arthritis - joint pain stable on tramadol, norco.  Hypothyroidism - takes levothyroxine 137 mcg daily. TSH 5.642  Pressure sore/right 3rd toe osteomyelitis - completed 1 month of augmentin. Followed by podiatry. She has PAD  COPD - takes advair 250/50, proventil hfa, duonebs. Followed by pulmonary. No recent exacerbations  Past Medical History:  Diagnosis Date  . Anemia    previous blood transfusions  . Arthritis    "all over"  . Asthma   . Bradycardia    requiring previous d/c of BB and reduction of amiodarone  . CAD (coronary artery disease)    nonobstructive per notes  . Chronic diastolic CHF (congestive heart failure) ([place])   . CKD (chronic kidney disease), stage III   . Complication of blood transfusion    "got the wrong blood type at [facility] in ~ 2015; no adverse reaction that we are aware of"/daughter, [name] (01/27/2016)  . COPD (chronic obstructive pulmonary disease) ([place])   . Depression    "light case"  . DVT (deep venous thrombosis) ([place]) 01/2016   a. LLE DVT 01/2016 - switched from Eliquis to Coumadin.  . Gastric stenosis    a. s/p stomach tube  . GERD (gastroesophageal reflux disease)   . History of blood transfusion    "several" (01/27/2016)  . History of stomach ulcers   . Hyperlipidemia   . Hypertension   . Hypothyroidism   . Paraesophageal hernia   . Perforated gastric ulcer ([place])   . Seasonal allergies   . SIADH (syndrome of inappropriate ADH production) ([place])    [name] 01/10/2015  . Small bowel  On: 05/28/2016 07:20   Ir Gj Tube Change  Result Date: 05/16/2016 INDICATION: History of dysphagia with chronic gastrojejunostomy catheter. Unfortunately, the patient's gastrojejunostomy catheter has become frequently clogged despite vigorous flushing following medication and enteric nutrition administration. Additionally, the gastrostomy lumen is rarely use. Please perform fluoroscopic guided exchange/conversion. EXAM: FLUOROSCOPIC GUIDED CONVERSION OF EXISTING GASTROJEJUNOSTOMY TUBE TO A JEJUNOSTOMY TUBE. COMPARISON:  Fluoroscopic guided gastrojejunostomy catheter exchange -1/30/ 2018; 02/08/2016; 01/27/2016; 01/07/2016 MEDICATIONS: None CONTRAST:  20 cc Isovue 300, administered into the enteric system. FLUOROSCOPY TIME:   7 minutes 18 seconds (89.1 mGy). COMPLICATIONS: None immediate. PROCEDURE: Despite vigorous attempted flushing an use of a stiff Amplatz wire, the jejunostomy lumen of the gastrojejunostomy catheter could not be on clogged. As such, the external portion of the gastrojejunostomy catheter was cut and the tube was removed intact. With the use of a Kumpe the catheter, a stiff Glidewire was advanced out the gastric antrum and into the proximal jejunum. Contrast injection confirmed appropriate positioning Under intermittent fluoroscopic guidance, a 22 French jejunostomy catheter was advanced through the gastrojejunostomy track with tip ultimately terminating within the proximal jejunum. The retention balloon was inflated and the balloon was cinched. Contrast was injected and several spot fluoroscopic images were obtained in various obliquities confirming intraluminal positioning. The patient tolerated the procedure well without immediate postprocedural complication. IMPRESSION: Successful fluoroscopic guided conversion of existing gastrojejunostomy catheter to a new 22-French jejunostomy tube. This conversion was performed a secondary to recurrent occlusion of the jejunostomy lumen as well as lack of frequent usage of the gastrostomy lumen. The lumen of the new 22 French jejunostomy catheter is much larger in caliber and therefore has a decreased risk of occlusion. The new jejunostomy tube is ready for immediate use. Electronically Signed   By: Sandi Mariscal M.D.   On: 05/16/2016 15:40   Ir Gj Tube Change  Result Date: 05/16/2016 Please refer to "Notes" to see consult details.  Dg Chest Port 1 View  Result Date: 05/22/2016 CLINICAL DATA:  83 year old female with shortness of breath. Respiratory distress. Recently hospitalized for pneumonia. EXAM: PORTABLE CHEST 1 VIEW COMPARISON:  Chest radiograph 05/11/2016 FINDINGS: There has been interval improvement of the previously seen left lung base airspace opacities.  Minimal residual density at the left lung base may represent atelectasis versus residual infiltrate. There is a small left pleural effusion. The right lung is clear. There is no pneumothorax. The cardiac silhouette is within normal limits. There is atherosclerotic calcification of the aortic arch. No acute osseous pathology. IMPRESSION: Interval improvement of the left lung base opacity with near complete resolution. Small left pleural effusion with minimal left lung base atelectasis versus residual infiltrate. Clinical correlation and follow-up recommended. Electronically Signed   By: Anner Crete M.D.   On: 05/22/2016 01:40   Dg Swallowing Func-speech Pathology  Result Date: 05/15/2016 Objective Swallowing Evaluation: Type of Study: MBS-Modified Barium Swallow Study Patient Details Name: Lia Vigilante MRN: [id number] Date of Birth: [birth year] Today's Date: 05/15/2016 Time: SLP Start Time (ACUTE ONLY): 0955-SLP Stop Time (ACUTE ONLY): 1014 SLP Time Calculation (min) (ACUTE ONLY): 19 min Past Medical History: Past Medical History: Diagnosis Date . Anemia   previous blood transfusions . Arthritis   "all over" . Asthma  . Bradycardia   requiring previous d/c of BB and reduction of amiodarone . CAD (coronary artery disease)   nonobstructive per notes . Chronic diastolic CHF (congestive heart failure) ([place])  . CKD (chronic kidney disease), stage III  . Complication of blood transfusion   "  Patient ID: Shannon [name], female   DOB: 1924-02-01, 83 y.o.   MRN: [id number]    Location:  [facility] Place of Service: OFFICE  Chief Complaint  Patient presents with  . Hospitalization Follow-up    admitted on 05/22/2016- 05/30/2016 for shortness of breath    HPI:  83 yo female seen today for hospital f/u of acute respiratory failure with hypoxia, aspiration pneumonitis/HCAP, chronic dHF, hypothyroidism, persistent afib, hyponatremia, PEG status 2/2 gastric outlet obstruction. She was given BiPAP then weaned off. RA sats 97%. Respiratory, urine and blood cx neg. CT chest showed "Left upper lobe collapse without identifiable cause on this study. Broncholiths within the collapsed left upper lobe identified.Nonspecific bilateral central ground-glass opacities -question edema, infection or nonspecific inflammation". Pulmonary consulted. Initially rec'd IV cefepime and vanco but was stopped on 2/21. abx resumed after lactic acid remained elevated. She was d/c'd on levaquin and doxy. EF 60% by 2D echo. She was d/c with [facility] PT/OT/ST/RN/SW/respiratory   Today she reports feeling better. She has SOB but improved. She is receiving HH. She attempts to follow hospital d/c instructions as written  She has seasonal allergy sx's and takes claritin daily and has flonase. She takes advair for asthma  No seizures since last OV. Takes gabapentin  HTN/afib/aflutter/hyperlipidemia/chronic dHF - BP controlled on hydralazine, imdur. Cholesterol stable on lipitor. Rate controlled on amiodarone and cardizem. She takes lasix. She takes coumadin for anticoagulation. INR Followed by cardio  GOO - s/p GJ tube. Takes Protonix BID. Constipation stable on lactulose. She gets TF at night. Drinks liquids po. Takes zofran prn  Hyponatremia/ hx SIADH- she was taking salt tabs but over the last several weeks has noticed the entire tab in her stool intermittently. Na 131  DM - diet controlled. A1c 7.4%. Takes gabapentin for  neuropathy  Hx CVA - stable. Takes ASA daily  Depression - mood stable on sertraline  Arthritis - joint pain stable on tramadol, norco.  Hypothyroidism - takes levothyroxine 137 mcg daily. TSH 5.642  Pressure sore/right 3rd toe osteomyelitis - completed 1 month of augmentin. Followed by podiatry. She has PAD  COPD - takes advair 250/50, proventil hfa, duonebs. Followed by pulmonary. No recent exacerbations  Past Medical History:  Diagnosis Date  . Anemia    previous blood transfusions  . Arthritis    "all over"  . Asthma   . Bradycardia    requiring previous d/c of BB and reduction of amiodarone  . CAD (coronary artery disease)    nonobstructive per notes  . Chronic diastolic CHF (congestive heart failure) ([place])   . CKD (chronic kidney disease), stage III   . Complication of blood transfusion    "got the wrong blood type at [facility] in ~ 2015; no adverse reaction that we are aware of"/daughter, [name] (01/27/2016)  . COPD (chronic obstructive pulmonary disease) ([place])   . Depression    "light case"  . DVT (deep venous thrombosis) ([place]) 01/2016   a. LLE DVT 01/2016 - switched from Eliquis to Coumadin.  . Gastric stenosis    a. s/p stomach tube  . GERD (gastroesophageal reflux disease)   . History of blood transfusion    "several" (01/27/2016)  . History of stomach ulcers   . Hyperlipidemia   . Hypertension   . Hypothyroidism   . Paraesophageal hernia   . Perforated gastric ulcer ([place])   . Seasonal allergies   . SIADH (syndrome of inappropriate ADH production) ([place])    [name] 01/10/2015  . Small bowel  Patient ID: Shannon [name], female   DOB: 1924-02-01, 83 y.o.   MRN: [id number]    Location:  [facility] Place of Service: OFFICE  Chief Complaint  Patient presents with  . Hospitalization Follow-up    admitted on 05/22/2016- 05/30/2016 for shortness of breath    HPI:  83 yo female seen today for hospital f/u of acute respiratory failure with hypoxia, aspiration pneumonitis/HCAP, chronic dHF, hypothyroidism, persistent afib, hyponatremia, PEG status 2/2 gastric outlet obstruction. She was given BiPAP then weaned off. RA sats 97%. Respiratory, urine and blood cx neg. CT chest showed "Left upper lobe collapse without identifiable cause on this study. Broncholiths within the collapsed left upper lobe identified.Nonspecific bilateral central ground-glass opacities -question edema, infection or nonspecific inflammation". Pulmonary consulted. Initially rec'd IV cefepime and vanco but was stopped on 2/21. abx resumed after lactic acid remained elevated. She was d/c'd on levaquin and doxy. EF 60% by 2D echo. She was d/c with [facility] PT/OT/ST/RN/SW/respiratory   Today she reports feeling better. She has SOB but improved. She is receiving HH. She attempts to follow hospital d/c instructions as written  She has seasonal allergy sx's and takes claritin daily and has flonase. She takes advair for asthma  No seizures since last OV. Takes gabapentin  HTN/afib/aflutter/hyperlipidemia/chronic dHF - BP controlled on hydralazine, imdur. Cholesterol stable on lipitor. Rate controlled on amiodarone and cardizem. She takes lasix. She takes coumadin for anticoagulation. INR Followed by cardio  GOO - s/p GJ tube. Takes Protonix BID. Constipation stable on lactulose. She gets TF at night. Drinks liquids po. Takes zofran prn  Hyponatremia/ hx SIADH- she was taking salt tabs but over the last several weeks has noticed the entire tab in her stool intermittently. Na 131  DM - diet controlled. A1c 7.4%. Takes gabapentin for  neuropathy  Hx CVA - stable. Takes ASA daily  Depression - mood stable on sertraline  Arthritis - joint pain stable on tramadol, norco.  Hypothyroidism - takes levothyroxine 137 mcg daily. TSH 5.642  Pressure sore/right 3rd toe osteomyelitis - completed 1 month of augmentin. Followed by podiatry. She has PAD  COPD - takes advair 250/50, proventil hfa, duonebs. Followed by pulmonary. No recent exacerbations  Past Medical History:  Diagnosis Date  . Anemia    previous blood transfusions  . Arthritis    "all over"  . Asthma   . Bradycardia    requiring previous d/c of BB and reduction of amiodarone  . CAD (coronary artery disease)    nonobstructive per notes  . Chronic diastolic CHF (congestive heart failure) ([place])   . CKD (chronic kidney disease), stage III   . Complication of blood transfusion    "got the wrong blood type at [facility] in ~ 2015; no adverse reaction that we are aware of"/daughter, [name] (01/27/2016)  . COPD (chronic obstructive pulmonary disease) ([place])   . Depression    "light case"  . DVT (deep venous thrombosis) ([place]) 01/2016   a. LLE DVT 01/2016 - switched from Eliquis to Coumadin.  . Gastric stenosis    a. s/p stomach tube  . GERD (gastroesophageal reflux disease)   . History of blood transfusion    "several" (01/27/2016)  . History of stomach ulcers   . Hyperlipidemia   . Hypertension   . Hypothyroidism   . Paraesophageal hernia   . Perforated gastric ulcer ([place])   . Seasonal allergies   . SIADH (syndrome of inappropriate ADH production) ([place])    [name] 01/10/2015  . Small bowel  On: 05/28/2016 07:20   Ir Gj Tube Change  Result Date: 05/16/2016 INDICATION: History of dysphagia with chronic gastrojejunostomy catheter. Unfortunately, the patient's gastrojejunostomy catheter has become frequently clogged despite vigorous flushing following medication and enteric nutrition administration. Additionally, the gastrostomy lumen is rarely use. Please perform fluoroscopic guided exchange/conversion. EXAM: FLUOROSCOPIC GUIDED CONVERSION OF EXISTING GASTROJEJUNOSTOMY TUBE TO A JEJUNOSTOMY TUBE. COMPARISON:  Fluoroscopic guided gastrojejunostomy catheter exchange -1/30/ 2018; 02/08/2016; 01/27/2016; 01/07/2016 MEDICATIONS: None CONTRAST:  20 cc Isovue 300, administered into the enteric system. FLUOROSCOPY TIME:   7 minutes 18 seconds (89.1 mGy). COMPLICATIONS: None immediate. PROCEDURE: Despite vigorous attempted flushing an use of a stiff Amplatz wire, the jejunostomy lumen of the gastrojejunostomy catheter could not be on clogged. As such, the external portion of the gastrojejunostomy catheter was cut and the tube was removed intact. With the use of a Kumpe the catheter, a stiff Glidewire was advanced out the gastric antrum and into the proximal jejunum. Contrast injection confirmed appropriate positioning Under intermittent fluoroscopic guidance, a 22 French jejunostomy catheter was advanced through the gastrojejunostomy track with tip ultimately terminating within the proximal jejunum. The retention balloon was inflated and the balloon was cinched. Contrast was injected and several spot fluoroscopic images were obtained in various obliquities confirming intraluminal positioning. The patient tolerated the procedure well without immediate postprocedural complication. IMPRESSION: Successful fluoroscopic guided conversion of existing gastrojejunostomy catheter to a new 22-French jejunostomy tube. This conversion was performed a secondary to recurrent occlusion of the jejunostomy lumen as well as lack of frequent usage of the gastrostomy lumen. The lumen of the new 22 French jejunostomy catheter is much larger in caliber and therefore has a decreased risk of occlusion. The new jejunostomy tube is ready for immediate use. Electronically Signed   By: Sandi Mariscal M.D.   On: 05/16/2016 15:40   Ir Gj Tube Change  Result Date: 05/16/2016 Please refer to "Notes" to see consult details.  Dg Chest Port 1 View  Result Date: 05/22/2016 CLINICAL DATA:  83 year old female with shortness of breath. Respiratory distress. Recently hospitalized for pneumonia. EXAM: PORTABLE CHEST 1 VIEW COMPARISON:  Chest radiograph 05/11/2016 FINDINGS: There has been interval improvement of the previously seen left lung base airspace opacities.  Minimal residual density at the left lung base may represent atelectasis versus residual infiltrate. There is a small left pleural effusion. The right lung is clear. There is no pneumothorax. The cardiac silhouette is within normal limits. There is atherosclerotic calcification of the aortic arch. No acute osseous pathology. IMPRESSION: Interval improvement of the left lung base opacity with near complete resolution. Small left pleural effusion with minimal left lung base atelectasis versus residual infiltrate. Clinical correlation and follow-up recommended. Electronically Signed   By: Anner Crete M.D.   On: 05/22/2016 01:40   Dg Swallowing Func-speech Pathology  Result Date: 05/15/2016 Objective Swallowing Evaluation: Type of Study: MBS-Modified Barium Swallow Study Patient Details Name: Lia Vigilante MRN: [id number] Date of Birth: [birth year] Today's Date: 05/15/2016 Time: SLP Start Time (ACUTE ONLY): 0955-SLP Stop Time (ACUTE ONLY): 1014 SLP Time Calculation (min) (ACUTE ONLY): 19 min Past Medical History: Past Medical History: Diagnosis Date . Anemia   previous blood transfusions . Arthritis   "all over" . Asthma  . Bradycardia   requiring previous d/c of BB and reduction of amiodarone . CAD (coronary artery disease)   nonobstructive per notes . Chronic diastolic CHF (congestive heart failure) ([place])  . CKD (chronic kidney disease), stage III  . Complication of blood transfusion   "  On: 05/28/2016 07:20   Ir Gj Tube Change  Result Date: 05/16/2016 INDICATION: History of dysphagia with chronic gastrojejunostomy catheter. Unfortunately, the patient's gastrojejunostomy catheter has become frequently clogged despite vigorous flushing following medication and enteric nutrition administration. Additionally, the gastrostomy lumen is rarely use. Please perform fluoroscopic guided exchange/conversion. EXAM: FLUOROSCOPIC GUIDED CONVERSION OF EXISTING GASTROJEJUNOSTOMY TUBE TO A JEJUNOSTOMY TUBE. COMPARISON:  Fluoroscopic guided gastrojejunostomy catheter exchange -1/30/ 2018; 02/08/2016; 01/27/2016; 01/07/2016 MEDICATIONS: None CONTRAST:  20 cc Isovue 300, administered into the enteric system. FLUOROSCOPY TIME:   7 minutes 18 seconds (89.1 mGy). COMPLICATIONS: None immediate. PROCEDURE: Despite vigorous attempted flushing an use of a stiff Amplatz wire, the jejunostomy lumen of the gastrojejunostomy catheter could not be on clogged. As such, the external portion of the gastrojejunostomy catheter was cut and the tube was removed intact. With the use of a Kumpe the catheter, a stiff Glidewire was advanced out the gastric antrum and into the proximal jejunum. Contrast injection confirmed appropriate positioning Under intermittent fluoroscopic guidance, a 22 French jejunostomy catheter was advanced through the gastrojejunostomy track with tip ultimately terminating within the proximal jejunum. The retention balloon was inflated and the balloon was cinched. Contrast was injected and several spot fluoroscopic images were obtained in various obliquities confirming intraluminal positioning. The patient tolerated the procedure well without immediate postprocedural complication. IMPRESSION: Successful fluoroscopic guided conversion of existing gastrojejunostomy catheter to a new 22-French jejunostomy tube. This conversion was performed a secondary to recurrent occlusion of the jejunostomy lumen as well as lack of frequent usage of the gastrostomy lumen. The lumen of the new 22 French jejunostomy catheter is much larger in caliber and therefore has a decreased risk of occlusion. The new jejunostomy tube is ready for immediate use. Electronically Signed   By: Sandi Mariscal M.D.   On: 05/16/2016 15:40   Ir Gj Tube Change  Result Date: 05/16/2016 Please refer to "Notes" to see consult details.  Dg Chest Port 1 View  Result Date: 05/22/2016 CLINICAL DATA:  83 year old female with shortness of breath. Respiratory distress. Recently hospitalized for pneumonia. EXAM: PORTABLE CHEST 1 VIEW COMPARISON:  Chest radiograph 05/11/2016 FINDINGS: There has been interval improvement of the previously seen left lung base airspace opacities.  Minimal residual density at the left lung base may represent atelectasis versus residual infiltrate. There is a small left pleural effusion. The right lung is clear. There is no pneumothorax. The cardiac silhouette is within normal limits. There is atherosclerotic calcification of the aortic arch. No acute osseous pathology. IMPRESSION: Interval improvement of the left lung base opacity with near complete resolution. Small left pleural effusion with minimal left lung base atelectasis versus residual infiltrate. Clinical correlation and follow-up recommended. Electronically Signed   By: Anner Crete M.D.   On: 05/22/2016 01:40   Dg Swallowing Func-speech Pathology  Result Date: 05/15/2016 Objective Swallowing Evaluation: Type of Study: MBS-Modified Barium Swallow Study Patient Details Name: Lia Vigilante MRN: [id number] Date of Birth: [birth year] Today's Date: 05/15/2016 Time: SLP Start Time (ACUTE ONLY): 0955-SLP Stop Time (ACUTE ONLY): 1014 SLP Time Calculation (min) (ACUTE ONLY): 19 min Past Medical History: Past Medical History: Diagnosis Date . Anemia   previous blood transfusions . Arthritis   "all over" . Asthma  . Bradycardia   requiring previous d/c of BB and reduction of amiodarone . CAD (coronary artery disease)   nonobstructive per notes . Chronic diastolic CHF (congestive heart failure) ([place])  . CKD (chronic kidney disease), stage III  . Complication of blood transfusion   "  On: 05/28/2016 07:20   Ir Gj Tube Change  Result Date: 05/16/2016 INDICATION: History of dysphagia with chronic gastrojejunostomy catheter. Unfortunately, the patient's gastrojejunostomy catheter has become frequently clogged despite vigorous flushing following medication and enteric nutrition administration. Additionally, the gastrostomy lumen is rarely use. Please perform fluoroscopic guided exchange/conversion. EXAM: FLUOROSCOPIC GUIDED CONVERSION OF EXISTING GASTROJEJUNOSTOMY TUBE TO A JEJUNOSTOMY TUBE. COMPARISON:  Fluoroscopic guided gastrojejunostomy catheter exchange -1/30/ 2018; 02/08/2016; 01/27/2016; 01/07/2016 MEDICATIONS: None CONTRAST:  20 cc Isovue 300, administered into the enteric system. FLUOROSCOPY TIME:   7 minutes 18 seconds (89.1 mGy). COMPLICATIONS: None immediate. PROCEDURE: Despite vigorous attempted flushing an use of a stiff Amplatz wire, the jejunostomy lumen of the gastrojejunostomy catheter could not be on clogged. As such, the external portion of the gastrojejunostomy catheter was cut and the tube was removed intact. With the use of a Kumpe the catheter, a stiff Glidewire was advanced out the gastric antrum and into the proximal jejunum. Contrast injection confirmed appropriate positioning Under intermittent fluoroscopic guidance, a 22 French jejunostomy catheter was advanced through the gastrojejunostomy track with tip ultimately terminating within the proximal jejunum. The retention balloon was inflated and the balloon was cinched. Contrast was injected and several spot fluoroscopic images were obtained in various obliquities confirming intraluminal positioning. The patient tolerated the procedure well without immediate postprocedural complication. IMPRESSION: Successful fluoroscopic guided conversion of existing gastrojejunostomy catheter to a new 22-French jejunostomy tube. This conversion was performed a secondary to recurrent occlusion of the jejunostomy lumen as well as lack of frequent usage of the gastrostomy lumen. The lumen of the new 22 French jejunostomy catheter is much larger in caliber and therefore has a decreased risk of occlusion. The new jejunostomy tube is ready for immediate use. Electronically Signed   By: Sandi Mariscal M.D.   On: 05/16/2016 15:40   Ir Gj Tube Change  Result Date: 05/16/2016 Please refer to "Notes" to see consult details.  Dg Chest Port 1 View  Result Date: 05/22/2016 CLINICAL DATA:  83 year old female with shortness of breath. Respiratory distress. Recently hospitalized for pneumonia. EXAM: PORTABLE CHEST 1 VIEW COMPARISON:  Chest radiograph 05/11/2016 FINDINGS: There has been interval improvement of the previously seen left lung base airspace opacities.  Minimal residual density at the left lung base may represent atelectasis versus residual infiltrate. There is a small left pleural effusion. The right lung is clear. There is no pneumothorax. The cardiac silhouette is within normal limits. There is atherosclerotic calcification of the aortic arch. No acute osseous pathology. IMPRESSION: Interval improvement of the left lung base opacity with near complete resolution. Small left pleural effusion with minimal left lung base atelectasis versus residual infiltrate. Clinical correlation and follow-up recommended. Electronically Signed   By: Anner Crete M.D.   On: 05/22/2016 01:40   Dg Swallowing Func-speech Pathology  Result Date: 05/15/2016 Objective Swallowing Evaluation: Type of Study: MBS-Modified Barium Swallow Study Patient Details Name: Lia Vigilante MRN: [id number] Date of Birth: [birth year] Today's Date: 05/15/2016 Time: SLP Start Time (ACUTE ONLY): 0955-SLP Stop Time (ACUTE ONLY): 1014 SLP Time Calculation (min) (ACUTE ONLY): 19 min Past Medical History: Past Medical History: Diagnosis Date . Anemia   previous blood transfusions . Arthritis   "all over" . Asthma  . Bradycardia   requiring previous d/c of BB and reduction of amiodarone . CAD (coronary artery disease)   nonobstructive per notes . Chronic diastolic CHF (congestive heart failure) ([place])  . CKD (chronic kidney disease), stage III  . Complication of blood transfusion   "

## 2016-06-05 ENCOUNTER — Telehealth: Payer: Self-pay | Admitting: *Deleted

## 2016-06-05 ENCOUNTER — Telehealth: Payer: Self-pay | Admitting: Cardiology

## 2016-06-05 ENCOUNTER — Ambulatory Visit (INDEPENDENT_AMBULATORY_CARE_PROVIDER_SITE_OTHER): Payer: Medicare Other | Admitting: Internal Medicine

## 2016-06-05 DIAGNOSIS — N183 Chronic kidney disease, stage 3 (moderate): Secondary | ICD-10-CM | POA: Diagnosis not present

## 2016-06-05 DIAGNOSIS — I2510 Atherosclerotic heart disease of native coronary artery without angina pectoris: Secondary | ICD-10-CM | POA: Diagnosis not present

## 2016-06-05 DIAGNOSIS — I5033 Acute on chronic diastolic (congestive) heart failure: Secondary | ICD-10-CM | POA: Diagnosis not present

## 2016-06-05 DIAGNOSIS — I4892 Unspecified atrial flutter: Secondary | ICD-10-CM

## 2016-06-05 DIAGNOSIS — J189 Pneumonia, unspecified organism: Secondary | ICD-10-CM | POA: Diagnosis not present

## 2016-06-05 DIAGNOSIS — I824Y2 Acute embolism and thrombosis of unspecified deep veins of left proximal lower extremity: Secondary | ICD-10-CM

## 2016-06-05 DIAGNOSIS — Z5181 Encounter for therapeutic drug level monitoring: Secondary | ICD-10-CM

## 2016-06-05 DIAGNOSIS — Z8673 Personal history of transient ischemic attack (TIA), and cerebral infarction without residual deficits: Secondary | ICD-10-CM

## 2016-06-05 DIAGNOSIS — E1122 Type 2 diabetes mellitus with diabetic chronic kidney disease: Secondary | ICD-10-CM | POA: Diagnosis not present

## 2016-06-05 DIAGNOSIS — R0602 Shortness of breath: Secondary | ICD-10-CM

## 2016-06-05 DIAGNOSIS — I130 Hypertensive heart and chronic kidney disease with heart failure and stage 1 through stage 4 chronic kidney disease, or unspecified chronic kidney disease: Secondary | ICD-10-CM | POA: Diagnosis not present

## 2016-06-05 LAB — POCT INR: INR: 5.6

## 2016-06-05 NOTE — Telephone Encounter (Signed)
Shannon [name], daughter called and requested a Rx to be faxed to [facility] [phone] for a [employer]. Please Advise.

## 2016-06-05 NOTE — Telephone Encounter (Signed)
New Message:   Amy wants to know if you want her to check her INR since the pt is home?She have not had it checked since last Monday in the hospital.

## 2016-06-05 NOTE — Telephone Encounter (Signed)
Spoke with Amy she will return to pts house and check INR as pt has been on some new medications.

## 2016-06-05 NOTE — Telephone Encounter (Signed)
[place] for bath bench for shortness of breath and history CVA

## 2016-06-06 MED ORDER — BATH BENCH WITH BACK MISC: 1.0000 | Freq: Once | 0 refills | Status: AC

## 2016-06-06 NOTE — Telephone Encounter (Signed)
Rx printed and faxed to [facility] 

## 2016-06-07 DIAGNOSIS — J189 Pneumonia, unspecified organism: Secondary | ICD-10-CM | POA: Diagnosis not present

## 2016-06-07 DIAGNOSIS — I2510 Atherosclerotic heart disease of native coronary artery without angina pectoris: Secondary | ICD-10-CM | POA: Diagnosis not present

## 2016-06-07 DIAGNOSIS — N183 Chronic kidney disease, stage 3 (moderate): Secondary | ICD-10-CM | POA: Diagnosis not present

## 2016-06-07 DIAGNOSIS — I5033 Acute on chronic diastolic (congestive) heart failure: Secondary | ICD-10-CM | POA: Diagnosis not present

## 2016-06-07 DIAGNOSIS — I130 Hypertensive heart and chronic kidney disease with heart failure and stage 1 through stage 4 chronic kidney disease, or unspecified chronic kidney disease: Secondary | ICD-10-CM | POA: Diagnosis not present

## 2016-06-07 DIAGNOSIS — E1122 Type 2 diabetes mellitus with diabetic chronic kidney disease: Secondary | ICD-10-CM | POA: Diagnosis not present

## 2016-06-08 ENCOUNTER — Inpatient Hospital Stay (HOSPITAL_COMMUNITY)
Admission: EM | Admit: 2016-06-08 | Discharge: 2016-06-16 | DRG: 308 | Disposition: A | Discharge status: Home Health | Payer: Medicare Other | Attending: Internal Medicine | Admitting: Internal Medicine

## 2016-06-08 ENCOUNTER — Emergency Department (HOSPITAL_COMMUNITY): Payer: Medicare Other

## 2016-06-08 DIAGNOSIS — D649 Anemia, unspecified: Secondary | ICD-10-CM | POA: Diagnosis present

## 2016-06-08 DIAGNOSIS — Z931 Gastrostomy status: Secondary | ICD-10-CM | POA: Diagnosis not present

## 2016-06-08 DIAGNOSIS — N183 Chronic kidney disease, stage 3 unspecified: Secondary | ICD-10-CM | POA: Diagnosis present

## 2016-06-08 DIAGNOSIS — I952 Hypotension due to drugs: Secondary | ICD-10-CM | POA: Diagnosis not present

## 2016-06-08 DIAGNOSIS — J690 Pneumonitis due to inhalation of food and vomit: Secondary | ICD-10-CM | POA: Diagnosis not present

## 2016-06-08 DIAGNOSIS — R001 Bradycardia, unspecified: Secondary | ICD-10-CM | POA: Diagnosis not present

## 2016-06-08 DIAGNOSIS — E11649 Type 2 diabetes mellitus with hypoglycemia without coma: Secondary | ICD-10-CM | POA: Diagnosis present

## 2016-06-08 DIAGNOSIS — I10 Essential (primary) hypertension: Secondary | ICD-10-CM | POA: Diagnosis not present

## 2016-06-08 DIAGNOSIS — Z66 Do not resuscitate: Secondary | ICD-10-CM | POA: Diagnosis present

## 2016-06-08 DIAGNOSIS — Z79899 Other long term (current) drug therapy: Secondary | ICD-10-CM

## 2016-06-08 DIAGNOSIS — E1165 Type 2 diabetes mellitus with hyperglycemia: Secondary | ICD-10-CM | POA: Diagnosis present

## 2016-06-08 DIAGNOSIS — E039 Hypothyroidism, unspecified: Secondary | ICD-10-CM | POA: Diagnosis not present

## 2016-06-08 DIAGNOSIS — Z8711 Personal history of peptic ulcer disease: Secondary | ICD-10-CM

## 2016-06-08 DIAGNOSIS — N184 Chronic kidney disease, stage 4 (severe): Secondary | ICD-10-CM | POA: Diagnosis not present

## 2016-06-08 DIAGNOSIS — Z9189 Other specified personal risk factors, not elsewhere classified: Secondary | ICD-10-CM

## 2016-06-08 DIAGNOSIS — E119 Type 2 diabetes mellitus without complications: Secondary | ICD-10-CM | POA: Diagnosis not present

## 2016-06-08 DIAGNOSIS — R7989 Other specified abnormal findings of blood chemistry: Secondary | ICD-10-CM

## 2016-06-08 DIAGNOSIS — R0902 Hypoxemia: Secondary | ICD-10-CM

## 2016-06-08 DIAGNOSIS — I129 Hypertensive chronic kidney disease with stage 1 through stage 4 chronic kidney disease, or unspecified chronic kidney disease: Secondary | ICD-10-CM | POA: Diagnosis not present

## 2016-06-08 DIAGNOSIS — I130 Hypertensive heart and chronic kidney disease with heart failure and stage 1 through stage 4 chronic kidney disease, or unspecified chronic kidney disease: Secondary | ICD-10-CM | POA: Diagnosis present

## 2016-06-08 DIAGNOSIS — E785 Hyperlipidemia, unspecified: Secondary | ICD-10-CM | POA: Diagnosis present

## 2016-06-08 DIAGNOSIS — E1142 Type 2 diabetes mellitus with diabetic polyneuropathy: Secondary | ICD-10-CM | POA: Diagnosis present

## 2016-06-08 DIAGNOSIS — E1122 Type 2 diabetes mellitus with diabetic chronic kidney disease: Secondary | ICD-10-CM

## 2016-06-08 DIAGNOSIS — R0682 Tachypnea, not elsewhere classified: Secondary | ICD-10-CM | POA: Diagnosis not present

## 2016-06-08 DIAGNOSIS — F329 Major depressive disorder, single episode, unspecified: Secondary | ICD-10-CM | POA: Diagnosis present

## 2016-06-08 DIAGNOSIS — Z8701 Personal history of pneumonia (recurrent): Secondary | ICD-10-CM

## 2016-06-08 DIAGNOSIS — L89322 Pressure ulcer of left buttock, stage 2: Secondary | ICD-10-CM | POA: Diagnosis present

## 2016-06-08 DIAGNOSIS — Z8673 Personal history of transient ischemic attack (TIA), and cerebral infarction without residual deficits: Secondary | ICD-10-CM

## 2016-06-08 DIAGNOSIS — E1143 Type 2 diabetes mellitus with diabetic autonomic (poly)neuropathy: Secondary | ICD-10-CM | POA: Diagnosis present

## 2016-06-08 DIAGNOSIS — R1110 Vomiting, unspecified: Secondary | ICD-10-CM | POA: Diagnosis not present

## 2016-06-08 DIAGNOSIS — R946 Abnormal results of thyroid function studies: Secondary | ICD-10-CM

## 2016-06-08 DIAGNOSIS — T461X5A Adverse effect of calcium-channel blockers, initial encounter: Secondary | ICD-10-CM | POA: Diagnosis present

## 2016-06-08 DIAGNOSIS — R42 Dizziness and giddiness: Secondary | ICD-10-CM | POA: Diagnosis not present

## 2016-06-08 DIAGNOSIS — E1151 Type 2 diabetes mellitus with diabetic peripheral angiopathy without gangrene: Secondary | ICD-10-CM | POA: Diagnosis present

## 2016-06-08 DIAGNOSIS — J189 Pneumonia, unspecified organism: Secondary | ICD-10-CM | POA: Diagnosis not present

## 2016-06-08 DIAGNOSIS — R404 Transient alteration of awareness: Secondary | ICD-10-CM | POA: Diagnosis not present

## 2016-06-08 DIAGNOSIS — Z794 Long term (current) use of insulin: Secondary | ICD-10-CM

## 2016-06-08 DIAGNOSIS — H409 Unspecified glaucoma: Secondary | ICD-10-CM | POA: Diagnosis present

## 2016-06-08 DIAGNOSIS — J449 Chronic obstructive pulmonary disease, unspecified: Secondary | ICD-10-CM | POA: Diagnosis present

## 2016-06-08 DIAGNOSIS — I482 Chronic atrial fibrillation: Secondary | ICD-10-CM | POA: Diagnosis present

## 2016-06-08 DIAGNOSIS — Z823 Family history of stroke: Secondary | ICD-10-CM

## 2016-06-08 DIAGNOSIS — Z9049 Acquired absence of other specified parts of digestive tract: Secondary | ICD-10-CM

## 2016-06-08 DIAGNOSIS — I5032 Chronic diastolic (congestive) heart failure: Secondary | ICD-10-CM | POA: Diagnosis not present

## 2016-06-08 DIAGNOSIS — Y95 Nosocomial condition: Secondary | ICD-10-CM | POA: Diagnosis not present

## 2016-06-08 DIAGNOSIS — K5900 Constipation, unspecified: Secondary | ICD-10-CM | POA: Diagnosis present

## 2016-06-08 DIAGNOSIS — I480 Paroxysmal atrial fibrillation: Secondary | ICD-10-CM | POA: Diagnosis present

## 2016-06-08 DIAGNOSIS — I2510 Atherosclerotic heart disease of native coronary artery without angina pectoris: Secondary | ICD-10-CM | POA: Diagnosis present

## 2016-06-08 DIAGNOSIS — N179 Acute kidney failure, unspecified: Secondary | ICD-10-CM | POA: Diagnosis not present

## 2016-06-08 DIAGNOSIS — T461X5S Adverse effect of calcium-channel blockers, sequela: Secondary | ICD-10-CM | POA: Diagnosis not present

## 2016-06-08 DIAGNOSIS — L89312 Pressure ulcer of right buttock, stage 2: Secondary | ICD-10-CM | POA: Diagnosis present

## 2016-06-08 DIAGNOSIS — Z8249 Family history of ischemic heart disease and other diseases of the circulatory system: Secondary | ICD-10-CM

## 2016-06-08 DIAGNOSIS — Z7901 Long term (current) use of anticoagulants: Secondary | ICD-10-CM

## 2016-06-08 DIAGNOSIS — Z87891 Personal history of nicotine dependence: Secondary | ICD-10-CM

## 2016-06-08 DIAGNOSIS — F17210 Nicotine dependence, cigarettes, uncomplicated: Secondary | ICD-10-CM | POA: Diagnosis present

## 2016-06-08 DIAGNOSIS — Z880 Allergy status to penicillin: Secondary | ICD-10-CM

## 2016-06-08 DIAGNOSIS — I9589 Other hypotension: Secondary | ICD-10-CM | POA: Diagnosis not present

## 2016-06-08 DIAGNOSIS — I700 Atherosclerosis of aorta: Secondary | ICD-10-CM | POA: Diagnosis present

## 2016-06-08 DIAGNOSIS — I495 Sick sinus syndrome: Secondary | ICD-10-CM | POA: Diagnosis not present

## 2016-06-08 DIAGNOSIS — Z86718 Personal history of other venous thrombosis and embolism: Secondary | ICD-10-CM

## 2016-06-08 DIAGNOSIS — K219 Gastro-esophageal reflux disease without esophagitis: Secondary | ICD-10-CM | POA: Diagnosis present

## 2016-06-08 LAB — URINALYSIS, ROUTINE W REFLEX MICROSCOPIC
Bilirubin Urine: NEGATIVE
Glucose, UA: NEGATIVE mg/dL
Hgb urine dipstick: NEGATIVE
Ketones, ur: NEGATIVE mg/dL
Leukocytes, UA: NEGATIVE
Nitrite: NEGATIVE
Protein, ur: NEGATIVE mg/dL
Specific Gravity, Urine: 1.006 (ref 1.005–1.030)
pH: 8 (ref 5.0–8.0)

## 2016-06-08 LAB — CBC WITH DIFFERENTIAL/PLATELET
Basophils Absolute: 0 10*3/uL (ref 0.0–0.1)
Basophils Relative: 0 %
Eosinophils Absolute: 0.3 10*3/uL (ref 0.0–0.7)
Eosinophils Relative: 4 %
HCT: 28.5 % — ABNORMAL LOW (ref 36.0–46.0)
Hemoglobin: 9.6 g/dL — ABNORMAL LOW (ref 12.0–15.0)
Lymphocytes Relative: 26 %
Lymphs Abs: 1.6 10*3/uL (ref 0.7–4.0)
MCH: 28.2 pg (ref 26.0–34.0)
MCHC: 33.7 g/dL (ref 30.0–36.0)
MCV: 83.8 fL (ref 78.0–100.0)
Monocytes Absolute: 0.7 10*3/uL (ref 0.1–1.0)
Monocytes Relative: 12 %
Neutro Abs: 3.5 10*3/uL (ref 1.7–7.7)
Neutrophils Relative %: 58 %
Platelets: 176 10*3/uL (ref 150–400)
RBC: 3.4 MIL/uL — ABNORMAL LOW (ref 3.87–5.11)
RDW: 20.8 % — ABNORMAL HIGH (ref 11.5–15.5)
WBC: 6 10*3/uL (ref 4.0–10.5)

## 2016-06-08 LAB — TSH: TSH: 10.017 u[IU]/mL — ABNORMAL HIGH (ref 0.350–4.500)

## 2016-06-08 LAB — I-STAT CHEM 8, ED
BUN: 53 mg/dL — ABNORMAL HIGH (ref 6–20)
Calcium, Ion: 1.13 mmol/L — ABNORMAL LOW (ref 1.15–1.40)
Chloride: 98 mmol/L — ABNORMAL LOW (ref 101–111)
Creatinine, Ser: 2 mg/dL — ABNORMAL HIGH (ref 0.44–1.00)
Glucose, Bld: 144 mg/dL — ABNORMAL HIGH (ref 65–99)
HCT: 31 % — ABNORMAL LOW (ref 36.0–46.0)
Hemoglobin: 10.5 g/dL — ABNORMAL LOW (ref 12.0–15.0)
Potassium: 4.5 mmol/L (ref 3.5–5.1)
Sodium: 135 mmol/L (ref 135–145)
TCO2: 28 mmol/L (ref 0–100)

## 2016-06-08 LAB — COMPREHENSIVE METABOLIC PANEL
ALT: 24 U/L (ref 14–54)
AST: 25 U/L (ref 15–41)
Albumin: 2.8 g/dL — ABNORMAL LOW (ref 3.5–5.0)
Alkaline Phosphatase: 53 U/L (ref 38–126)
Anion gap: 7 (ref 5–15)
BUN: 54 mg/dL — ABNORMAL HIGH (ref 6–20)
CO2: 29 mmol/L (ref 22–32)
Calcium: 8.7 mg/dL — ABNORMAL LOW (ref 8.9–10.3)
Chloride: 99 mmol/L — ABNORMAL LOW (ref 101–111)
Creatinine, Ser: 1.97 mg/dL — ABNORMAL HIGH (ref 0.44–1.00)
GFR calc Af Amer: 25 mL/min — ABNORMAL LOW (ref >60)
GFR calc non Af Amer: 21 mL/min — ABNORMAL LOW (ref >60)
Glucose, Bld: 148 mg/dL — ABNORMAL HIGH (ref 65–99)
Potassium: 4.4 mmol/L (ref 3.5–5.1)
Sodium: 135 mmol/L (ref 135–145)
Total Bilirubin: 0.6 mg/dL (ref 0.3–1.2)
Total Protein: 5.4 g/dL — ABNORMAL LOW (ref 6.5–8.1)

## 2016-06-08 LAB — BRAIN NATRIURETIC PEPTIDE: B Natriuretic Peptide: 1268.6 pg/mL — ABNORMAL HIGH (ref 0.0–100.0)

## 2016-06-08 LAB — MAGNESIUM: Magnesium: 1.9 mg/dL (ref 1.7–2.4)

## 2016-06-08 LAB — T4, FREE: Free T4: 1.5 ng/dL — ABNORMAL HIGH (ref 0.61–1.12)

## 2016-06-08 LAB — GLUCOSE, CAPILLARY
Glucose-Capillary: 155 mg/dL — ABNORMAL HIGH (ref 65–99)
Glucose-Capillary: 194 mg/dL — ABNORMAL HIGH (ref 65–99)

## 2016-06-08 LAB — I-STAT TROPONIN, ED: Troponin i, poc: 0.03 ng/mL (ref 0.00–0.08)

## 2016-06-08 LAB — CBG MONITORING, ED: Glucose-Capillary: 158 mg/dL — ABNORMAL HIGH (ref 65–99)

## 2016-06-08 LAB — PROTIME-INR
INR: 1.78
Prothrombin Time: 20.9 seconds — ABNORMAL HIGH (ref 11.4–15.2)

## 2016-06-08 LAB — LACTIC ACID, PLASMA: Lactic Acid, Venous: 1.8 mmol/L (ref 0.5–1.9)

## 2016-06-08 MED ORDER — DOBUTAMINE IN D5W 4-5 MG/ML-% IV SOLN: 2.5000 ug/kg/min | INTRAVENOUS | Status: DC

## 2016-06-08 MED ORDER — LATANOPROST 0.005 % OP SOLN
1.0000 [drp] | Freq: Every day | OPHTHALMIC | Status: DC
  Administered 2016-06-08 – 2016-06-15 (×7): 1 [drp] via OPHTHALMIC
  Filled 2016-06-08 (×2): qty 2.5

## 2016-06-08 MED ORDER — WARFARIN - PHARMACIST DOSING INPATIENT
Freq: Every day | Status: DC
  Administered 2016-06-11: 17:00:00
  Administered 2016-06-14: 1

## 2016-06-08 MED ORDER — EPINEPHRINE PF 1 MG/ML IJ SOLN
0.5000 ug/min | INTRAVENOUS | Status: DC
  Administered 2016-06-08: 0.5 ug/min via INTRAVENOUS
  Filled 2016-06-08: qty 4

## 2016-06-08 MED ORDER — IPRATROPIUM-ALBUTEROL 0.5-2.5 (3) MG/3ML IN SOLN
3.0000 mL | Freq: Four times a day (QID) | RESPIRATORY_TRACT | Status: DC | PRN
  Administered 2016-06-13: 3 mL via RESPIRATORY_TRACT
  Filled 2016-06-08: qty 3

## 2016-06-08 MED ORDER — SODIUM CHLORIDE 0.9 % IV SOLN: 250.0000 mL | INTRAVENOUS | Status: DC | PRN

## 2016-06-08 MED ORDER — SODIUM CHLORIDE 0.9 % IV BOLUS (SEPSIS)
1000.0000 mL | Freq: Once | INTRAVENOUS | Status: AC
  Administered 2016-06-08: 1000 mL via INTRAVENOUS

## 2016-06-08 MED ORDER — ATROPINE SULFATE 1 MG/10ML IJ SOSY
0.5000 mg | PREFILLED_SYRINGE | Freq: Once | INTRAMUSCULAR | Status: AC
  Administered 2016-06-08: 0.5 mg via INTRAVENOUS

## 2016-06-08 MED ORDER — GLUCAGON HCL RDNA (DIAGNOSTIC) 1 MG IJ SOLR
1.0000 mg/h | INTRAVENOUS | Status: DC
  Administered 2016-06-08 – 2016-06-09 (×3): 1 mg/h via INTRAVENOUS
  Filled 2016-06-08 (×3): qty 5

## 2016-06-08 MED ORDER — HEPARIN SODIUM (PORCINE) 5000 UNIT/ML IJ SOLN: 5000.0000 [IU] | Freq: Three times a day (TID) | INTRAMUSCULAR | Status: DC

## 2016-06-08 MED ORDER — CYCLOSPORINE 0.05 % OP EMUL
1.0000 [drp] | Freq: Two times a day (BID) | OPHTHALMIC | Status: DC
  Administered 2016-06-08 – 2016-06-16 (×16): 1 [drp] via OPHTHALMIC
  Filled 2016-06-08 (×17): qty 1

## 2016-06-08 MED ORDER — ONDANSETRON HCL 4 MG/2ML IJ SOLN
4.0000 mg | Freq: Four times a day (QID) | INTRAMUSCULAR | Status: DC | PRN
  Administered 2016-06-08: 4 mg via INTRAVENOUS
  Filled 2016-06-08 (×2): qty 2

## 2016-06-08 MED ORDER — HYDROCORTISONE NA SUCCINATE PF 100 MG IJ SOLR
50.0000 mg | Freq: Four times a day (QID) | INTRAMUSCULAR | Status: DC
  Administered 2016-06-08 – 2016-06-09 (×3): 50 mg via INTRAVENOUS
  Filled 2016-06-08 (×3): qty 2

## 2016-06-08 MED ORDER — LEVOTHYROXINE SODIUM 100 MCG IV SOLR
75.0000 ug | Freq: Every day | INTRAVENOUS | Status: DC
  Administered 2016-06-09 – 2016-06-10 (×2): 75 ug via INTRAVENOUS
  Filled 2016-06-08 (×2): qty 5

## 2016-06-08 MED ORDER — GABAPENTIN 300 MG PO CAPS
300.0000 mg | ORAL_CAPSULE | Freq: Every day | ORAL | Status: DC
  Administered 2016-06-09 – 2016-06-11 (×3): 300 mg
  Filled 2016-06-08 (×4): qty 1

## 2016-06-08 MED ORDER — GABAPENTIN 300 MG PO CAPS: 300.0000 mg | ORAL_CAPSULE | Freq: Every day | ORAL | Status: DC

## 2016-06-08 MED ORDER — WARFARIN SODIUM 2 MG PO TABS
4.0000 mg | ORAL_TABLET | Freq: Once | ORAL | Status: AC
  Administered 2016-06-08: 4 mg via ORAL
  Filled 2016-06-08: qty 2
  Filled 2016-06-08: qty 1

## 2016-06-08 MED ORDER — SERTRALINE HCL 100 MG PO TABS: 100.0000 mg | ORAL_TABLET | Freq: Every day | ORAL | Status: DC

## 2016-06-08 MED ORDER — ATROPINE SULFATE 1 MG/10ML IJ SOSY
PREFILLED_SYRINGE | INTRAMUSCULAR | Status: AC
  Filled 2016-06-08: qty 10

## 2016-06-08 MED ORDER — DOPAMINE-DEXTROSE 3.2-5 MG/ML-% IV SOLN
0.0000 ug/kg/min | INTRAVENOUS | Status: DC
  Administered 2016-06-08: 5 ug/kg/min via INTRAVENOUS
  Filled 2016-06-08: qty 250

## 2016-06-08 MED ORDER — INSULIN ASPART 100 UNIT/ML ~~LOC~~ SOLN
2.0000 [IU] | SUBCUTANEOUS | Status: DC
  Administered 2016-06-08 (×2): 4 [IU] via SUBCUTANEOUS
  Administered 2016-06-09 (×2): 2 [IU] via SUBCUTANEOUS

## 2016-06-08 MED ORDER — ATORVASTATIN CALCIUM 10 MG PO TABS: 10.0000 mg | ORAL_TABLET | Freq: Every day | ORAL | Status: DC

## 2016-06-08 MED ORDER — CALCIUM GLUCONATE 10 % IV SOLN
1.0000 g | Freq: Once | INTRAVENOUS | Status: AC
  Administered 2016-06-08: 1 g via INTRAVENOUS
  Filled 2016-06-08: qty 10

## 2016-06-08 MED ORDER — SERTRALINE HCL 100 MG PO TABS
100.0000 mg | ORAL_TABLET | Freq: Every day | ORAL | Status: DC
  Administered 2016-06-09 – 2016-06-12 (×4): 100 mg
  Filled 2016-06-08 (×4): qty 1

## 2016-06-08 MED ORDER — ATORVASTATIN CALCIUM 10 MG PO TABS
10.0000 mg | ORAL_TABLET | Freq: Every day | ORAL | Status: DC
  Administered 2016-06-09 – 2016-06-12 (×4): 10 mg
  Filled 2016-06-08 (×4): qty 1

## 2016-06-08 NOTE — ED Notes (Signed)
Dr. Feinstein at bedside   

## 2016-06-08 NOTE — Consult Note (Signed)
CONSULT NOTE  Date: 06/08/2016               Patient Name:  Shannon [name] MRN: [id number]  DOB: [birth year] Age / Sex: 83 y.o., female        PCP: Kirt Boys Primary Cardiologist: Delton See            Referring Physician: Erma Heritage              Reason for Consult: Bradycardia            History of Present Illness: Patient is a 83 y.o. female with a PMHx of hypertension, dyslipidemia, CVA, COPD and asthma, chronic atrial fibrillation, diastolic congestive heart failure, diabetes, hypothyroidism, gastric outlet obstruction with prior G tube, who was admitted to [facility] on 06/08/2016 for evaluation of near syncope and bradycardia  We are called for bradcardia She had received atropine  0.5 mg x 2 doses and her HR is now 59 .  Had  altered mental status , now its much better On low dose epi drip which has helped her HR and BP  I spoke with her daughter who provided much of the history. Shannon [name] is reliable with her medications. She does not eat a lot of salt.  She has a recent admission with pneumonia and possible aspiration pneumonia. She has a feeding tube in place.    She has a history of bradycardia in the past. Her medications and then held but she presented last month with HCAP and tachycardia Amiodarone was DC'd but she was started on Bystlic and Diltiazem  She took her meds this am      Medications: Outpatient medications:  (Not in a hospital admission)  Current medications: Current Facility-Administered Medications  Medication Dose Route Frequency Provider Last Rate Last Dose  . atropine 1 MG/10ML injection           . calcium gluconate 1 g in sodium chloride 0.9 % 100 mL IVPB  1 g Intravenous Once Shaune Pollack, MD      . EPINEPHrine (ADRENALIN) 4 mg in dextrose 5 % 250 mL (0.016 mg/mL) infusion  0.5-20 mcg/min Intravenous Titrated Shaune Pollack, MD 1.9 mL/hr at 06/08/16 1533 0.5 mcg/min at 06/08/16 1533  . sodium chloride 0.9 % bolus 1,000 mL  1,000 mL  Intravenous Once Shaune Pollack, MD 1,000 mL/hr at 06/08/16 1524 1,000 mL at 06/08/16 1524   Current Outpatient Prescriptions  Medication Sig Dispense Refill  . acetaminophen (TYLENOL) 325 MG tablet Take 650 mg by mouth 2 (two) times daily as needed for mild pain.     [name] AMINO ACIDS-PROTEIN HYDROLYS PO Take 30 mLs by mouth 2 (two) times daily.    [name] atorvastatin (LIPITOR) 10 MG tablet TAKE ONE TABLET BY MOUTH ONCE DAILY 90 tablet 1  . b complex vitamins tablet Take 1 tablet by mouth daily.     . bacitracin-polymyxin b (POLYSPORIN) ophthalmic ointment Place 1 application into both eyes 2 (two) times daily. apply to eye every 12 hours while awake    . benzonatate (TESSALON) 200 MG capsule Take 1 capsule (200 mg total) by mouth 3 (three) times daily as needed for cough. 60 capsule 1  . bisacodyl (DULCOLAX) 10 MG suppository Place 1 suppository (10 mg total) rectally daily as needed for moderate constipation. 12 suppository 0  . bisoprolol (ZEBETA) 5 MG tablet Take 2 tablets (10 mg total) by mouth daily. 30 tablet 0  . calcium-vitamin D (OSCAL WITH D) 500-200 MG-UNIT per  tablet Take 1 tablet by mouth daily with breakfast.     . chlorhexidine (PERIDEX) 0.12 % solution Use as directed 15 mLs in the mouth or throat 2 (two) times daily.    [name] diltiazem (CARDIZEM CD) 120 MG 24 hr capsule Take 1 capsule (120 mg total) by mouth daily. 90 capsule 3  . feeding supplement, GLUCERNA SHAKE, (GLUCERNA SHAKE) LIQD Take 237 mLs by mouth See admin instructions. One to two times a day    . fluticasone (FLONASE) 50 MCG/ACT nasal spray Place 2 sprays into both nostrils daily. 16 g 3  . furosemide (LASIX) 40 MG tablet Take 1 tablet (40 mg total) by mouth daily. 30 tablet 0  . gabapentin (NEURONTIN) 300 MG capsule Take 1 capsule (300 mg total) by mouth at bedtime. 30 capsule 0  . hydrALAZINE (APRESOLINE) 50 MG tablet Take 1 tablet (50 mg total) by mouth 3 (three) times daily. 120 tablet 0  . insulin aspart (NOVOLOG FLEXPEN)  100 UNIT/ML FlexPen Insulin sliding scale: Blood sugar  120-150   3units                       151-200   4units                       201-250   7units                       251- 300  11units                       301-350   15uints                       351-400   20units                       >400         call MD immediately 1 pen 1  . Insulin Glargine (LANTUS SOLOSTAR) 100 UNIT/ML Solostar Pen Inject 5 Units into the skin daily at 10 pm. 5 pen 6  . Insulin Pen Needle 31G X 5 MM MISC 1 Units by Does not apply route at bedtime. 50 each 6  . ipratropium-albuterol (DUONEB) 0.5-2.5 (3) MG/3ML SOLN Take 3 mLs by nebulization every 6 (six) hours as needed (sob). Use 3 times daily x 4 days then every 6 hours as needed. (Patient taking differently: Take 3 mLs by nebulization See admin instructions. Every 4 to 6 hours (up to 6 times a day)) 360 mL 4  . isosorbide mononitrate (IMDUR) 30 MG 24 hr tablet TAKE 1 TABLET (30 MG TOTAL) BY MOUTH DAILY. 90 tablet 3  . latanoprost (XALATAN) 0.005 % ophthalmic solution Place 1 drop into both eyes at bedtime. 2.5 mL 12  . levalbuterol (XOPENEX HFA) 45 MCG/ACT inhaler Inhale 2 puffs into the lungs every 6 (six) hours as needed for wheezing or shortness of breath. 1 Inhaler 12  . levothyroxine (SYNTHROID, LEVOTHROID) 137 MCG tablet TAKE 1 TABLET (137 MCG TOTAL) BY MOUTH DAILY BEFORE BREAKFAST. 30 tablet 2  . loratadine (CLARITIN) 10 MG tablet Take 10 mg by mouth daily.    . Maltodextrin-Xanthan Gum (RESOURCE THICKENUP CLEAR) POWD Take 120 g by mouth as needed (use to thicken liquids.). 20 Can 0  . Multiple Vitamin (MULTIVITAMIN WITH MINERALS) TABS tablet Take 1 tablet by mouth daily.    . Nutritional  Supplements (FEEDING SUPPLEMENT, NEPRO CARB STEADY,) LIQD Take 237 mLs by mouth 2 (two) times daily between meals. 30 Can 0  . Nutritional Supplements (FEEDING SUPPLEMENT, OSMOLITE 1.5 CAL,) LIQD Place 1,000 mLs into feeding tube daily. 1000 mL 3  . Polyethyl Glycol-Propyl  Glycol 0.4-0.3 % SOLN Place 1 drop into both eyes 4 (four) times daily.     . predniSONE (DELTASONE) 10 MG tablet Please take 2tabs on day one, then 1 tab on day two, then half tab on day three, then stop. 4 tablet 0  . RESTASIS 0.05 % ophthalmic emulsion USE 1 DROP INTO BOTH EYES TWICE DAILY 60 mL 0  . sertraline (ZOLOFT) 100 MG tablet TAKE ONE TABLET BY MOUTH ONCE DAILY FOR DEPRESSION 90 tablet 1  . traMADol (ULTRAM) 50 MG tablet Take 50 mg by mouth daily as needed for moderate pain.     [name] warfarin (COUMADIN) 2.5 MG tablet Take as directed by [facility]. (Patient taking differently: Take 2.5 mg by mouth daily. ) 30 tablet 1  . Water For Irrigation, Sterile (FREE WATER) SOLN Place 230 mLs into feeding tube 3 (three) times daily.       Allergies  Allergen Reactions  . Penicillins Itching, Rash and Other (See Comments)    Has patient had a PCN reaction causing immediate rash, facial/tongue/throat swelling, SOB or lightheadedness with hypotension: Yes Has patient had a PCN reaction causing severe rash involving mucus membranes or skin necrosis: Unk Has patient had a PCN reaction that required hospitalization: Unk Has patient had a PCN reaction occurring within the last 10 years: Unk If all of the above answers are "NO", then may proceed with Cephalosporin use.      Past Medical History:  Diagnosis Date  . Anemia    previous blood transfusions  . Arthritis    "all over"  . Asthma   . Bradycardia    requiring previous d/c of BB and reduction of amiodarone  . CAD (coronary artery disease)    nonobstructive per notes  . Chronic diastolic CHF (congestive heart failure) (HCC)   . CKD (chronic kidney disease), stage III   . Complication of blood transfusion    "got the wrong blood type at [facility] in ~ 2015; no adverse reaction that we are aware of"/daughter, [name] (01/27/2016)  . COPD (chronic obstructive pulmonary disease) (HCC)   . Depression    "light case"  . DVT (deep venous  thrombosis) (HCC) 01/2016   a. LLE DVT 01/2016 - switched from Eliquis to Coumadin.  . Gastric stenosis    a. s/p stomach tube  . GERD (gastroesophageal reflux disease)   . History of blood transfusion    "several" (01/27/2016)  . History of stomach ulcers   . Hyperlipidemia   . Hypertension   . Hypothyroidism   . Paraesophageal hernia   . Perforated gastric ulcer (HCC)   . Seasonal allergies   . SIADH (syndrome of inappropriate ADH production) (HCC)    [name] 01/10/2015  . Small bowel obstruction    "I don't know how many" (01/11/2015)  . Stroke (HCC)    "light one"  . Type II diabetes mellitus (HCC)    "related to prednisone use  for > 20 yr; once predinose stopped; no more DM RX" (01/27/2016)  . Ventral hernia with bowel obstruction     Past Surgical History:  Procedure Laterality Date  . CATARACT EXTRACTION W/ INTRAOCULAR LENS  IMPLANT, BILATERAL    . CHOLECYSTECTOMY OPEN    .  COLECTOMY    . ESOPHAGOGASTRODUODENOSCOPY N/A 01/19/2014   Procedure: ESOPHAGOGASTRODUODENOSCOPY (EGD);  Surgeon: Hilarie Fredrickson, MD;  Location: [facility];  Service: Endoscopy;  Laterality: N/A;  . ESOPHAGOGASTRODUODENOSCOPY N/A 01/20/2014   Procedure: ESOPHAGOGASTRODUODENOSCOPY (EGD);  Surgeon: Hilarie Fredrickson, MD;  Location: [facility];  Service: Endoscopy;  Laterality: N/A;  . ESOPHAGOGASTRODUODENOSCOPY N/A 03/19/2014   Procedure: ESOPHAGOGASTRODUODENOSCOPY (EGD);  Surgeon: Rachael Fee, MD;  Location: [facility];  Service: Endoscopy;  Laterality: N/A;  . ESOPHAGOGASTRODUODENOSCOPY N/A 07/08/2015   Procedure: ESOPHAGOGASTRODUODENOSCOPY (EGD);  Surgeon: Sherrilyn Rist, MD;  Location: [facility] ENDOSCOPY;  Service: Endoscopy;  Laterality: N/A;  . ESOPHAGOGASTRODUODENOSCOPY (EGD) WITH PROPOFOL N/A 09/15/2015   Procedure: ESOPHAGOGASTRODUODENOSCOPY (EGD) WITH PROPOFOL;  Surgeon: Ruffin Frederick, MD;  Location: [facility] ENDOSCOPY;  Service: Gastroenterology;  Laterality: N/A;  . GASTROJEJUNOSTOMY      hx/notes 01/10/2015  . GASTROJEJUNOSTOMY N/A 09/23/2015   Procedure: OPEN GASTROJEJUNOSTOMY TUBE PLACEMENT;  Surgeon: De Blanch Kinsinger, MD;  Location: [facility] ORS;  Service: General;  Laterality: N/A;  . GLAUCOMA SURGERY Bilateral   . HERNIA REPAIR  2015  . IR GENERIC HISTORICAL  01/07/2016   IR GJ TUBE CHANGE 01/07/2016 Malachy Moan, MD [facility]  . IR GENERIC HISTORICAL  01/27/2016   IR MECH REMOV OBSTRUC MAT ANY COLON TUBE W/FLUORO 01/27/2016 Richarda Overlie, MD [facility]  . IR GENERIC HISTORICAL  02/07/2016   IR PATIENT EVAL TECH 0-60 MINS Darrell K Allred, PA-C [facility]  . IR GENERIC HISTORICAL  02/08/2016   IR GJ TUBE CHANGE 02/08/2016 Berdine Dance, MD [facility]  . IR GENERIC HISTORICAL  01/06/2016   IR GJ TUBE CHANGE 01/06/2016 [facility] OUT REF  . IR GENERIC HISTORICAL  05/02/2016   IR CM INJ ANY COLONIC TUBE W/FLUORO 05/02/2016 Oley Balm, MD [facility]  . IR GENERIC HISTORICAL  05/15/2016   IR GJ TUBE CHANGE 05/15/2016 Simonne Come, MD [facility]  . LAPAROTOMY N/A 01/20/2015   Procedure: EXPLORATORY LAPAROTOMY;  Surgeon: Abigail Miyamoto, MD;  Location: [facility] OR;  Service: General;  Laterality: N/A;  . LYSIS OF ADHESION N/A 01/20/2015   Procedure: LYSIS OF ADHESIONS < 1 HOUR;  Surgeon: Abigail Miyamoto, MD;  Location: [facility] OR;  Service: General;  Laterality: N/A;  . TONSILLECTOMY    . TUBAL LIGATION    . VENTRAL HERNIA REPAIR  2015   incarcerated ventral hernia ([facility] 09/2013)/notes 01/10/2015    Family History  Problem Relation Age of Onset  . Stroke Mother   . Hypertension Mother   . Diabetes Brother   . Heart attack Neg Hx     Social History:  reports that she has never smoked. She has quit using smokeless tobacco. Her smokeless tobacco use included Snuff. She reports that she does not drink alcohol or use drugs.   Review of Systems: Constitutional:  denies fever, chills, diaphoresis, appetite change and fatigue.  HEENT: denies photophobia, eye pain, redness,  hearing loss, ear pain, congestion, sore throat, rhinorrhea, sneezing, neck pain, neck stiffness and tinnitus.  Respiratory: denies SOB, DOE, cough, chest tightness, and wheezing.  Cardiovascular: denies chest pain, palpitations and leg swelling.  Gastrointestinal: denies nausea, vomiting, abdominal pain, diarrhea, constipation, blood in stool.  Genitourinary: denies dysuria, urgency, frequency, hematuria, flank pain and difficulty urinating.  Musculoskeletal: denies  myalgias, back pain, joint swelling, arthralgias and gait problem.   Skin: denies pallor, rash and wound.  Neurological: denies dizziness, seizures, syncope, weakness, light-headedness, numbness and headaches.   Hematological: denies adenopathy, easy bruising,  personal or family bleeding history.  Psychiatric/ Behavioral: denies suicidal ideation, mood changes, confusion, nervousness, sleep disturbance and agitation.    Physical Exam: BP 150/56   Pulse (!) 55   Temp 97.2 F (36.2 C) (Rectal)   Resp 16   SpO2 100%   Wt Readings from Last 3 Encounters:  06/02/16 139 lb 6.4 oz (63.2 kg)  05/30/16 137 lb 4.8 oz (62.3 kg)  05/17/16 137 lb 4.8 oz (62.3 kg)    General: Vital signs reviewed and noted.  Chronically ill appearing , very elderly female   Head: Normocephalic, atraumatic, sclera anicteric,   Neck: Supple. Negative for carotid bruits. No JVD   Lungs:  Clear bilaterally, no  wheezes, rales, or rhonchi. Breathing is normal   Heart: RRR with S1 S2. No murmurs, rubs, or gallops   Abdomen/ GI :  Soft, non-tender, non-distended with normoactive bowel sounds. No hepatomegaly. No rebound/guarding. No obvious abdominal masses   MSK: Strength and the appear normal for age.   Extremities: No clubbing or cyanosis. No edema.  Distal pedal pulses are 2+ and equal   Neurologic:  CN are grossly intact,  No obvious motor or sensory defect.  Alert and oriented X 3. Moves all extremities spontaneously.  Psych: Responds to questions  appropriately with a normal affect.     Lab results: Basic Metabolic Panel:  Recent Labs Lab 06/08/16 1523 06/08/16 1541  NA 135 135  K 4.4 4.5  CL 99* 98*  CO2 29  --   GLUCOSE 148* 144*  BUN 54* 53*  CREATININE 1.97* 2.00*  CALCIUM 8.7*  --   MG 1.9  --     Liver Function Tests:  Recent Labs Lab 06/08/16 1523  AST 25  ALT 24  ALKPHOS 53  BILITOT 0.6  PROT 5.4*  ALBUMIN 2.8*   No results for input(s): LIPASE, AMYLASE in the last 168 hours. No results for input(s): AMMONIA in the last 168 hours.  CBC:  Recent Labs Lab 06/08/16 1523 06/08/16 1541  WBC 6.0  --   NEUTROABS 3.5  --   HGB 9.6* 10.5*  HCT 28.5* 31.0*  MCV 83.8  --   PLT 176  --     Cardiac Enzymes: No results for input(s): CKTOTAL, CKMB, CKMBINDEX, TROPONINI in the last 168 hours.  BNP: Invalid input(s): POCBNP  CBG:  Recent Labs Lab 06/08/16 1518  GLUCAP 158*    Coagulation Studies:  Recent Labs  06/08/16 1523  LABPROT 20.9*  INR 1.78     Other results: Personal review of EKG shows :    #`1  - junctional bradycardia , no St or T wave changes  #2:  Sinus brady at 56, no ST or T wave changes   Imaging:  No results found.    Assessment & Plan:  1.  Bradycardia: Mrs. Deadmond presents with a episode of profound bradycardia. This is most likely result of her bystolic therapy  as well as the diltiazem. She has received 0.5 mg of atropine 2. She is also now on an epinephrine drip at low dose. She is mentating well. Her heart rate and blood pressure have improved.  Continue to hold bystlic  and diltiazem and allow these to drift down. 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 She is very aero elderly and is a poor candidate for pacemaker placement. I discussed this option with her family member and Mrs. Ravindran has already decided that she does not want to have a pacemaker.  Her  TSH is 10.017 - possibly related to her recent Amio therapy She is on Synthroid 0.137 mg  a day . She may need more synthroid.   Will defer to IM   We'll continue with medical therapy.  2. Acute renal failure: Her creatinine was 1.08 10 days ago. Today her creatinine is 2.0. Further recommendations per the internal medicine team.  This may be due to her severe hypotension and bradycardia and may improve with a higher blood pressure and heart rate.  3. Hypothyroidism:    THS is elevated.   May need a higher dose of synthroid.  Will defer to IM      Alvia Grove., MD, [facility] 06/08/2016, 4:23 PM Office - [phone] Pager [phone]

## 2016-06-08 NOTE — H&P (Signed)
PULMONARY / CRITICAL CARE MEDICINE   Name: Shannon [name] MRN: [id number] DOB: July 24, 1923    ADMISSION DATE:  06/08/2016 CONSULTATION DATE:  06/08/2016  REFERRING MD:  Dr. Erma Heritage   CHIEF COMPLAINT: Bradycardia    HISTORY OF PRESENT ILLNESS:   83 year old female with PMH of asthma, bradycardia, CAD, Diastolic HF, CKD stage III, COPD, DVT, GERD, HTN, DM type 2, gastric outlet obstruction s/p G-tube placement, and hyperlipidemia. Presents to ED 3/8 with complaints of syncope and weakness. Upon arrival EMS reported HR 30-40s but mentating well. Cardiology was consulted. Recently patient was started on Cardizem and Bisoprolol. Daughter reports that patient has been taking her medications as prescribed since last admission. In ED currently on EPI gtt and receiving dose of calcium gluconate. PCCM asked to admit.   Patient had recent admission 2/19-2/22 for dyspnea. During admission was suspected to have CAP and treated with 7 day course of antibiotics. Cardiology stopped amiodarone secondary to suspected lung toxicity  PAST MEDICAL HISTORY :  She  has a past medical history of Anemia; Arthritis; Asthma; Bradycardia; CAD (coronary artery disease); Chronic diastolic CHF (congestive heart failure) (HCC); CKD (chronic kidney disease), stage III; Complication of blood transfusion; COPD (chronic obstructive pulmonary disease) (HCC); Depression; DVT (deep venous thrombosis) (HCC) (01/2016); Gastric stenosis; GERD (gastroesophageal reflux disease); History of blood transfusion; History of stomach ulcers; Hyperlipidemia; Hypertension; Hypothyroidism; Paraesophageal hernia; Perforated gastric ulcer (HCC); Seasonal allergies; SIADH (syndrome of inappropriate ADH production) (HCC); Small bowel obstruction; Stroke (HCC); Type II diabetes mellitus (HCC); and Ventral hernia with bowel obstruction.  PAST SURGICAL HISTORY: She  has a past surgical history that includes Tubal ligation; Colectomy; Esophagogastroduodenoscopy  (N/A, 01/19/2014); Esophagogastroduodenoscopy (N/A, 01/20/2014); Esophagogastroduodenoscopy (N/A, 03/19/2014); Ventral hernia repair (2015); Cataract extraction w/ intraocular lens  implant, bilateral; Gastrojejunostomy; laparotomy (N/A, 01/20/2015); Lysis of adhesion (N/A, 01/20/2015); Esophagogastroduodenoscopy (N/A, 07/08/2015); Esophagogastroduodenoscopy (egd) with propofol (N/A, 09/15/2015); Gastrojejunostomy (N/A, 09/23/2015); ir generic historical (01/07/2016); Tonsillectomy; Cholecystectomy open; Hernia repair (2015); Glaucoma surgery (Bilateral); ir generic historical (01/27/2016); ir generic historical (02/07/2016); ir generic historical (02/08/2016); ir generic historical (01/06/2016); ir generic historical (05/02/2016); and ir generic historical (05/15/2016).  Allergies  Allergen Reactions  . Penicillins Itching, Rash and Other (See Comments)    Has patient had a PCN reaction causing immediate rash, facial/tongue/throat swelling, SOB or lightheadedness with hypotension: Yes Has patient had a PCN reaction causing severe rash involving mucus membranes or skin necrosis: Unk Has patient had a PCN reaction that required hospitalization: Unk Has patient had a PCN reaction occurring within the last 10 years: Unk If all of the above answers are "NO", then may proceed with Cephalosporin use.     No current facility-administered medications on file prior to encounter.    Current Outpatient Prescriptions on File Prior to Encounter  Medication Sig  . acetaminophen (TYLENOL) 325 MG tablet Take 650 mg by mouth 2 (two) times daily as needed for mild pain.   [name] AMINO ACIDS-PROTEIN HYDROLYS PO Take 30 mLs by mouth 2 (two) times daily.  [name] atorvastatin (LIPITOR) 10 MG tablet TAKE ONE TABLET BY MOUTH ONCE DAILY  . b complex vitamins tablet Take 1 tablet by mouth daily.   . bacitracin-polymyxin b (POLYSPORIN) ophthalmic ointment Place 1 application into both eyes 2 (two) times daily. apply to eye every 12 hours  while awake  . bisoprolol (ZEBETA) 5 MG tablet Take 2 tablets (10 mg total) by mouth daily. (Patient taking differently: Take 5 mg by mouth 2 (two) times daily. )  . calcium-vitamin  D (OSCAL WITH D) 500-200 MG-UNIT per tablet Take 1 tablet by mouth daily with breakfast.   . chlorhexidine (PERIDEX) 0.12 % solution Use as directed 15 mLs in the mouth or throat 2 (two) times daily.  [name] diltiazem (CARDIZEM CD) 120 MG 24 hr capsule Take 1 capsule (120 mg total) by mouth daily.  . feeding supplement, GLUCERNA SHAKE, (GLUCERNA SHAKE) LIQD Take 237 mLs by mouth daily. One to two times a day   . fluticasone (FLONASE) 50 MCG/ACT nasal spray Place 2 sprays into both nostrils daily.  . furosemide (LASIX) 40 MG tablet Take 1 tablet (40 mg total) by mouth daily.  [name] gabapentin (NEURONTIN) 300 MG capsule Take 1 capsule (300 mg total) by mouth at bedtime.  . hydrALAZINE (APRESOLINE) 50 MG tablet Take 1 tablet (50 mg total) by mouth 3 (three) times daily.  . insulin aspart (NOVOLOG FLEXPEN) 100 UNIT/ML FlexPen Insulin sliding scale: Blood sugar  120-150   3units                       151-200   4units                       201-250   7units                       251- 300  11units                       301-350   15uints                       351-400   20units                       >400         call MD immediately  . Insulin Glargine (LANTUS SOLOSTAR) 100 UNIT/ML Solostar Pen Inject 5 Units into the skin daily at 10 pm.  . ipratropium-albuterol (DUONEB) 0.5-2.5 (3) MG/3ML SOLN Take 3 mLs by nebulization every 6 (six) hours as needed (sob). Use 3 times daily x 4 days then every 6 hours as needed. (Patient taking differently: Take 3 mLs by nebulization every 6 (six) hours as needed (sob). )  . isosorbide mononitrate (IMDUR) 30 MG 24 hr tablet TAKE 1 TABLET (30 MG TOTAL) BY MOUTH DAILY.  [name] latanoprost (XALATAN) 0.005 % ophthalmic solution Place 1 drop into both eyes at bedtime.  . levalbuterol (XOPENEX HFA) 45 MCG/ACT  inhaler Inhale 2 puffs into the lungs every 6 (six) hours as needed for wheezing or shortness of breath.  . levothyroxine (SYNTHROID, LEVOTHROID) 137 MCG tablet TAKE 1 TABLET (137 MCG TOTAL) BY MOUTH DAILY BEFORE BREAKFAST.  . Multiple Vitamin (MULTIVITAMIN WITH MINERALS) TABS tablet Take 1 tablet by mouth daily.  . Nutritional Supplements (FEEDING SUPPLEMENT, OSMOLITE 1.5 CAL,) LIQD Place 1,000 mLs into feeding tube daily. (Patient taking differently: Place 65 mL/hr into feeding tube daily. )  . Polyethyl Glycol-Propyl Glycol 0.4-0.3 % SOLN Place 1 drop into both eyes 4 (four) times daily.   . RESTASIS 0.05 % ophthalmic emulsion USE 1 DROP INTO BOTH EYES TWICE DAILY  . sertraline (ZOLOFT) 100 MG tablet TAKE ONE TABLET BY MOUTH ONCE DAILY FOR DEPRESSION  . Water For Irrigation, Sterile (FREE WATER) SOLN Place 230 mLs into feeding tube 3 (three) times daily.  . benzonatate (TESSALON) 200 MG capsule Take 1  capsule (200 mg total) by mouth 3 (three) times daily as needed for cough.  . bisacodyl (DULCOLAX) 10 MG suppository Place 1 suppository (10 mg total) rectally daily as needed for moderate constipation.  . Insulin Pen Needle 31G X 5 MM MISC 1 Units by Does not apply route at bedtime.  [name] loratadine (CLARITIN) 10 MG tablet Take 10 mg by mouth daily.  . Maltodextrin-Xanthan Gum (RESOURCE THICKENUP CLEAR) POWD Take 120 g by mouth as needed (use to thicken liquids.).  [name] Nutritional Supplements (FEEDING SUPPLEMENT, NEPRO CARB STEADY,) LIQD Take 237 mLs by mouth 2 (two) times daily between meals.  . predniSONE (DELTASONE) 10 MG tablet Please take 2tabs on day one, then 1 tab on day two, then half tab on day three, then stop.  . traMADol (ULTRAM) 50 MG tablet Take 50 mg by mouth daily as needed for moderate pain.   [name] warfarin (COUMADIN) 2.5 MG tablet Take as directed by [facility]. (Patient taking differently: Take 2.5 mg by mouth daily. )    FAMILY HISTORY:  Her indicated that her mother is  deceased. She indicated that her father is deceased. She indicated that the status of her brother is unknown. She indicated that her maternal grandmother is deceased. She indicated that her maternal grandfather is deceased. She indicated that her paternal grandmother is deceased. She indicated that her paternal grandfather is deceased. She indicated that the status of her neg hx is unknown.    SOCIAL HISTORY: She  reports that she has never smoked. She has quit using smokeless tobacco. Her smokeless tobacco use included Snuff. She reports that she does not drink alcohol or use drugs.  REVIEW OF SYSTEMS:   All negative; except for those that are bolded, which indicate positives.  Constitutional: weight loss, weight gain, night sweats, fevers, chills, fatigue, weakness.  HEENT: headaches, sore throat, sneezing, nasal congestion, post nasal drip, difficulty swallowing, tooth/dental problems, visual complaints, visual changes, ear aches. Neuro: difficulty with speech, weakness, numbness, ataxia. CV:  chest pain, orthopnea, PND, swelling in lower extremities, dizziness, palpitations, syncope.  Resp: cough, hemoptysis, dyspnea, wheezing. GI: heartburn, indigestion, abdominal pain, nausea, vomiting, diarrhea, constipation, change in bowel habits, loss of appetite, hematemesis, melena, hematochezia.  GU: dysuria, change in color of urine, urgency or frequency, flank pain, hematuria. MSK: joint pain or swelling, decreased range of motion. Psych: change in mood or affect, depression, anxiety, suicidal ideations, homicidal ideations. Skin: rash, itching, bruising.   SUBJECTIVE:  On epi gtt. Family at bedside   VITAL SIGNS: BP 164/57 (BP Location: Right Arm)   Pulse (!) 46   Temp 97.2 F (36.2 C) (Rectal)   Resp 14   SpO2 100%   HEMODYNAMICS:    VENTILATOR SETTINGS:    INTAKE / OUTPUT: No intake/output data recorded.  PHYSICAL EXAMINATION: General:  Elderly female, no distress, lying in  bed  Neuro:  Alert, oriented, follows commands  HEENT:  Normocephalic  Cardiovascular:  Brady, Regular Rhythm, no MRG   Lungs:  Clear Breath sounds, non-labored  Abdomen:  Non-tender, PEG in place  Musculoskeletal: +1 edema to BLE Skin:  Warm, dry, intact   LABS:  BMET  Recent Labs Lab 06/08/16 1523 06/08/16 1541  NA 135 135  K 4.4 4.5  CL 99* 98*  CO2 29  --   BUN 54* 53*  CREATININE 1.97* 2.00*  GLUCOSE 148* 144*    Electrolytes  Recent Labs Lab 06/08/16 1523  CALCIUM 8.7*  MG 1.9    CBC  Recent Labs Lab 06/08/16 1523 06/08/16 1541  WBC 6.0  --   HGB 9.6* 10.5*  HCT 28.5* 31.0*  PLT 176  --     Coag's  Recent Labs Lab 06/05/16 06/08/16 1523  INR 5.6 1.78    Sepsis Markers No results for input(s): LATICACIDVEN, PROCALCITON, O2SATVEN in the last 168 hours.  ABG No results for input(s): PHART, PCO2ART, PO2ART in the last 168 hours.  Liver Enzymes  Recent Labs Lab 06/08/16 1523  AST 25  ALT 24  ALKPHOS 53  BILITOT 0.6  ALBUMIN 2.8*    Cardiac Enzymes No results for input(s): TROPONINI, PROBNP in the last 168 hours.  Glucose  Recent Labs Lab 06/08/16 1518  GLUCAP 158*    Imaging Dg Chest Portable 1 View  Result Date: 06/08/2016 CLINICAL DATA:  Bradycardia EXAM: PORTABLE CHEST 1 VIEW COMPARISON:  Chest x-ray of 05/29/2016 FINDINGS: Aeration of the left upper lobe appears to have improved. There is mild left basilar atelectasis present. Mediastinal and hilar contours are unremarkable. Mild cardiomegaly is stable. No bony abnormality is seen other than degenerative change particularly in the left shoulder. IMPRESSION: Improved aeration of the left upper lobe. Mild left basilar atelectasis. Electronically Signed   By: Dwyane Dee M.D.   On: 06/08/2016 16:39     STUDIES:  CXR 3/8 >Improved aeration of left upper lobe, mild left basilar atelectasis   CULTURES: None.   ANTIBIOTICS: None.   SIGNIFICANT EVENTS: 3/8 > ED with  bradycardia   LINES/TUBES: None.   DISCUSSION: 83 year old female with extensive medical history as listed above presents with bradycardia, HR 30-40, requiring epi gtt. Cardiology consulted. Suspected secondary to CCB. Will switch EPI to dopamine to maintain MAP >60.   ASSESSMENT / PLAN:  PULMONARY A: H/O COPD, asthma P:   Maintain Oxygen >92 Pulmonary Hygiene  CARDIOVASCULAR A:  Bradycardia secondary to CCB Chronic Diastolic HF Chronic A.fib (on coumadin) H/O HTN, HLD, CAD P:  Cardiac Monitoring  Wean Dopamine to maintain MAP >60 Hold home Bisoprolol and Caredizem  Start Glucagen gtt  Continue Coumadin   RENAL A:   CKD stage III P:   Trend BMP Replace electrolytes as needed   GASTROINTESTINAL A:   GERD  Gastric Outlet Obstruction s/p PEG  P:   PPI  HEMATOLOGIC A:   Anemia  P:  Trend CBC   INFECTIOUS A:   No issues  P:   Trend WBC and Fever Curve   ENDOCRINE A:   Hypothyroidism  H/O DM 2    P:   SSI Q4H glucose checks  Continue Synthroid Hydrocortisone sodium 50 mg q6h   NEUROLOGIC A:   H/O CVA, Depression  P:   RASS goal: 0 Monitor  Continue Zoloft    FAMILY  - Updates: Daughter updated at bedside   - Inter-disciplinary family meet or Palliative Care meeting due by:  06/15/2016  CC Time: 55 mins  Jovita Kussmaul, AG-ACNP [facility]  Pgr: [phone]  PCCM Pgr: [phone]  STAFF NOTE: Cindi Carbon, MD FACP have personally reviewed patient's available data, including medical history, events of note, physical examination and test results as part of my evaluation. I have discussed with resident/NP and other care providers such as pharmacist, RN and RRT. In addition, I personally evaluated patient and elicited key findings of: IN ER, no distress, nek brace in place, HR 40-70, no sig murmur, lungs CTA distant, low muscle mass, chronic edema small ulcerations lower ext anterior, she  presents with  symptomatic Bradycardia likely secondary to calcium channel blocker and BB affect in setting of TB syndome, r.o conribution hypothyroidism induced from amio and residual amio affect, plan is to hold CBB, BB, calcium was given, change epi to dopamine infusion through PIV, add glucagon bolus and drip, may eed calcium infusion and frequent assements calcium ( hold this for now), add her home synthroid as IV and stress roids prior, goal is MAP 60, I have had extensive discussions with family daughter and pt. We discussed patients current circumstances and organ failures. We also discussed patient's prior wishes under circumstances such as this. Family and pt has decided to NOT perform resuscitation if arrest but to continue current medical support for now. Admit ICU, tele, hope to avoid central line, chem in am  The patient is critically ill with multiple organ systems failure and requires high complexity decision making for assessment and support, frequent evaluation and titration of therapies, application of advanced monitoring technologies and extensive interpretation of multiple databases.   Critical Care Time devoted to patient care services described in this note is40 Minutes. This time reflects time of care of this signee: Rory Percy, MD FACP. This critical care time does not reflect procedure time, or teaching time or supervisory time of PA/NP/Med student/Med Resident etc but could involve care discussion time. Rest per NP/medical resident whose note is outlined above and that I agree with   Mcarthur Rossetti. Tyson Alias, MD, FACP Pgr: [phone] [facility] 06/08/2016 9:36 PM

## 2016-06-08 NOTE — Progress Notes (Signed)
eLink Physician-Brief Progress Note Patient Name: Shannon [name] DOB: May 05, 1923 MRN: [id number]   Date of Service  06/08/2016  HPI/Events of Note  Notified by bedside nurse patient complaining of nausea. Successfully weaned off epinephrine infusion. Continuing to require low-dose dopamine infusion for heart rate maintenance.   eICU Interventions  1. Continuing titration of dopamine 2. Continuous telemetry monitoring 3. Zofran IV as needed for nausea      Intervention Category Intermediate Interventions: Other:  Tera Partridge 06/08/2016, 10:04 PM

## 2016-06-08 NOTE — ED Provider Notes (Signed)
[facility] DEPT Provider Note   CSN: [id number] Arrival date & time: 06/08/16  1455     History   Chief Complaint Chief Complaint  Patient presents with  . Bradycardia    HPI Shannon [name] is a 83 y.o. female.  HPI   83 year old female with past medical history of hypertension, HLD, CAD, hyperlipidemia, who presents with generalized lightheadedness. History at this time is limited secondary to altered mental status. According to EMS, they were called out to the scene for weakness over the last 12 hours. On their arrival, patient had heart rate in the 40s but was mentating well. In route, patient has become increasingly drowsy and confused. Her heart rate has also decreased to the low 30s en route. Currently, she feels lightheaded and is unable to provide further history as she feels as though she is going to pass out. Denies any chest pain. Denies any other complaints. Of note, she was recently admitted for healthcare associated pneumonia. She is on diltiazem. Family gives her blood pressure medications and does not believe that she has taken any additional deficits.  Past Medical History:  Diagnosis Date  . Anemia    previous blood transfusions  . Arthritis    "all over"  . Asthma   . Bradycardia    requiring previous d/c of BB and reduction of amiodarone  . CAD (coronary artery disease)    nonobstructive per notes  . Chronic diastolic CHF (congestive heart failure) ([place])   . CKD (chronic kidney disease), stage III   . Complication of blood transfusion    "got the wrong blood type at [facility] in ~ 2015; no adverse reaction that we are aware of"/daughter, [name] (01/27/2016)  . COPD (chronic obstructive pulmonary disease) ([place])   . Depression    "light case"  . DVT (deep venous thrombosis) ([place]) 01/2016   a. LLE DVT 01/2016 - switched from Eliquis to Coumadin.  . Gastric stenosis    a. s/p stomach tube  . GERD (gastroesophageal reflux disease)   . History of blood  transfusion    "several" (01/27/2016)  . History of stomach ulcers   . Hyperlipidemia   . Hypertension   . Hypothyroidism   . Paraesophageal hernia   . Perforated gastric ulcer ([place])   . Seasonal allergies   . SIADH (syndrome of inappropriate ADH production) ([place])    [name] 01/10/2015  . Small bowel obstruction    "I don't know how many" (01/11/2015)  . Stroke ([place])    "light one"  . Type II diabetes mellitus ([place])    "related to prednisone use  for > 20 yr; once predinose stopped; no more DM RX" (01/27/2016)  . Ventral hernia with bowel obstruction     Patient Active Problem List   Diagnosis Date Noted  . Aspiration pneumonitis ([place])   . Other specified hypothyroidism   . Acute respiratory failure with hypoxia ([place]) 05/22/2016  . HCAP (healthcare-associated pneumonia)   . Acute on chronic congestive heart failure ([place])   . CHF (NYHA class IV, ACC/AHA stage D) ([place])   . Advance care planning   . Palliative care by specialist   . Shortness of breath   . Pressure injury of skin 03/11/2016  . PEG (percutaneous endoscopic gastrostomy) status ([place]) 03/10/2016  . Acute on chronic diastolic congestive heart failure ([place]) 03/02/2016  . Encounter for therapeutic drug monitoring 02/16/2016  . Sepsis ([place]) 02/08/2016  . UTI (urinary tract infection) 02/08/2016  . Warfarin anticoagulation   .  Chronic seasonal allergic rhinitis   . Sinus pain   . Acute renal insufficiency 01/26/2016  . Acute deep vein thrombosis (DVT) of left lower extremity ([place])   . Acute respiratory failure ([place])   . Respiratory alkalosis   . Atrial flutter ([place]) 01/24/2016  . Chronic diastolic CHF (congestive heart failure) ([place]) 01/24/2016  . Infection 01/24/2016  . Bilateral leg edema 12/29/2015  . Hypothyroidism due to medication   . Weakness   . Pyloric stenosis   . Pressure ulcer 09/15/2015  . Hyperkalemia 09/12/2015  . Acute osteomyelitis of toe of right foot ([place])   . PVD (peripheral vascular  disease) ([place])   . Adjustment disorder with anxiety 08/29/2015  . Diabetic ulcer of toe of right foot associated with type 2 diabetes mellitus, with necrosis of muscle ([place])   . Diverticulitis of large intestine without perforation or abscess without bleeding   . Colitis 08/26/2015  . Acute on chronic diastolic CHF (congestive heart failure), NYHA class 4 ([place]) 08/02/2015  . Esophageal candidiasis ([place])   . Goals of care, counseling/discussion   . Dyspnea   . SOB (shortness of breath)   . Acute renal failure superimposed on stage 3 chronic kidney disease ([place])   . Encounter for long-term (current) use of other high-risk medications   . Respiratory distress 07/06/2015  . Dysphagia 07/05/2015  . Asthma with allergic rhinitis 06/04/2015  . Hypertensive heart disease 03/18/2015  . Malnutrition of moderate degree 01/28/2015  . Syncope 01/27/2015  . Fever, unspecified 01/22/2015  . Bradycardia 01/21/2015  . Hypotension 01/21/2015  . Palliative care encounter   . DNR (do not resuscitate) discussion   . Protein-calorie malnutrition, severe 01/12/2015  . Elevated troponin 01/12/2015  . HTN (hypertension), uncontrolled 01/12/2015  . AP (abdominal pain) 01/10/2015  . SBO (small bowel obstruction) 01/10/2015  . Cerebrovascular disease 07/14/2014  . Diabetic polyneuropathy associated with type 2 diabetes mellitus ([place]) 07/14/2014  . Hyperlipidemia 07/14/2014  . Gastric outlet obstruction 05/21/2014  . Unstable gait 04/29/2014  . Hyponatremia 03/23/2014  . Acute encephalopathy 03/22/2014  . Slurred speech   . Gastroparesis   . Slow transit constipation 02/03/2014  . Carotid stenosis 02/03/2014  . Demand ischemia ([place]) 01/21/2014  . Gastric bezoar 01/20/2014  . Anemia of chronic disease 01/18/2014  . Chronic kidney insufficiency, stage 3 (moderate) 01/18/2014  . Depression 09/29/2013  . CAP (community acquired pneumonia) 09/29/2013  . Essential hypertension, benign 09/22/2013  . GERD  (gastroesophageal reflux disease) 09/22/2013  . Dyslipidemia 09/22/2013  . DM (diabetes mellitus) type II uncontrolled, periph vascular disorder ([place]) 09/22/2013  . Persistent atrial fibrillation ([place]) 09/22/2013  . CVA (cerebral vascular accident) ([place]) 09/22/2013  . Diabetic neuropathy ([place]) 09/22/2013  . Hypothyroidism 09/22/2013  . Chronic gastrojejunal ulcer with perforation ([place]) 09/07/2013  . Diabetes ([place]) 09/07/2013  . Accelerated hypertension 09/07/2013  . Systemic lupus erythematosus (SLE) inhibitor 09/07/2013  . Other specified abnormal immunological findings in serum 09/07/2013    Past Surgical History:  Procedure Laterality Date  . CATARACT EXTRACTION W/ INTRAOCULAR LENS  IMPLANT, BILATERAL    . CHOLECYSTECTOMY OPEN    . COLECTOMY    . ESOPHAGOGASTRODUODENOSCOPY N/A 01/19/2014   Procedure: ESOPHAGOGASTRODUODENOSCOPY (EGD);  Surgeon: Irene Shipper, MD;  Location: [facility];  Service: Endoscopy;  Laterality: N/A;  . ESOPHAGOGASTRODUODENOSCOPY N/A 01/20/2014   Procedure: ESOPHAGOGASTRODUODENOSCOPY (EGD);  Surgeon: Irene Shipper, MD;  Location: [facility];  Service: Endoscopy;  Laterality: N/A;  . ESOPHAGOGASTRODUODENOSCOPY N/A 03/19/2014   Procedure: ESOPHAGOGASTRODUODENOSCOPY (EGD);  Surgeon: Milus Banister, MD;  Location: [facility];  Service: Endoscopy;  Laterality: N/A;  . ESOPHAGOGASTRODUODENOSCOPY N/A 07/08/2015   Procedure: ESOPHAGOGASTRODUODENOSCOPY (EGD);  Surgeon: Doran Stabler, MD;  Location[facility] ENDOSCOPY;  Service: Endoscopy;  Laterality: N/A;  . ESOPHAGOGASTRODUODENOSCOPY (EGD) WITH PROPOFOL N/A 09/15/2015   Procedure: ESOPHAGOGASTRODUODENOSCOPY (EGD) WITH PROPOFOL;  Surgeon: Manus Gunning, MD;  Location: [facility] ENDOSCOPY;  Service: Gastroenterology;  Laterality: N/A;  . GASTROJEJUNOSTOMY     hx/notes 01/10/2015  . GASTROJEJUNOSTOMY N/A 09/23/2015   Procedure: OPEN GASTROJEJUNOSTOMY TUBE PLACEMENT;  Surgeon: Arta Bruce Kinsinger, MD;  Location: [facility]  ORS;  Service: General;  Laterality: N/A;  . GLAUCOMA SURGERY Bilateral   . HERNIA REPAIR  2015  . IR GENERIC HISTORICAL  01/07/2016   IR GJ TUBE CHANGE 01/07/2016 Jacqulynn Cadet, MD [facility]  . IR GENERIC HISTORICAL  01/27/2016   IR MECH REMOV OBSTRUC MAT ANY COLON TUBE W/FLUORO 01/27/2016 Markus Daft, MD [facility]  . IR GENERIC HISTORICAL  02/07/2016   IR PATIENT EVAL TECH 0-60 MINS Darrell K Allred, PA-C [facility]  . IR GENERIC HISTORICAL  02/08/2016   IR GJ TUBE CHANGE 02/08/2016 Greggory Keen, MD [facility]  . IR GENERIC HISTORICAL  01/06/2016   IR GJ TUBE CHANGE 01/06/2016 [facility] OUT REF  . IR GENERIC HISTORICAL  05/02/2016   IR CM INJ ANY COLONIC TUBE W/FLUORO 05/02/2016 Arne Cleveland, MD [facility]  . IR GENERIC HISTORICAL  05/15/2016   IR GJ TUBE CHANGE 05/15/2016 Sandi Mariscal, MD [facility]  . LAPAROTOMY N/A 01/20/2015   Procedure: EXPLORATORY LAPAROTOMY;  Surgeon: Coralie Keens, MD;  Location: [facility];  Service: General;  Laterality: N/A;  . LYSIS OF ADHESION N/A 01/20/2015   Procedure: LYSIS OF ADHESIONS < 1 HOUR;  Surgeon: Coralie Keens, MD;  Location: [facility];  Service: General;  Laterality: N/A;  . TONSILLECTOMY    . TUBAL LIGATION    . VENTRAL HERNIA REPAIR  2015   incarcerated ventral hernia ([facility] 09/2013)/notes 01/10/2015    OB History    No data available       Home Medications    Prior to Admission medications   Medication Sig Start Date End Date Taking? Authorizing Provider  acetaminophen (TYLENOL) 325 MG tablet Take 650 mg by mouth 2 (two) times daily as needed for mild pain.     Historical Provider, MD  AMINO ACIDS-PROTEIN HYDROLYS PO Take 30 mLs by mouth 2 (two) times daily.    Historical Provider, MD  atorvastatin (LIPITOR) 10 MG tablet TAKE ONE TABLET BY MOUTH ONCE DAILY 05/22/16   Gildardo Cranker, DO  b complex vitamins tablet Take 1 tablet by mouth daily.     Historical Provider, MD  bacitracin-polymyxin b (POLYSPORIN) ophthalmic ointment  Place 1 application into both eyes 2 (two) times daily. apply to eye every 12 hours while awake    Historical Provider, MD  benzonatate (TESSALON) 200 MG capsule Take 1 capsule (200 mg total) by mouth 3 (three) times daily as needed for cough. 03/24/16   Gildardo Cranker, DO  bisacodyl (DULCOLAX) 10 MG suppository Place 1 suppository (10 mg total) rectally daily as needed for moderate constipation. 09/30/15   Debbe Odea, MD  bisoprolol (ZEBETA) 5 MG tablet Take 2 tablets (10 mg total) by mouth daily. 05/30/16   Florencia Reasons, MD  calcium-vitamin D (OSCAL WITH D) 500-200 MG-UNIT per tablet Take 1 tablet by mouth daily with breakfast.     Historical Provider, MD  chlorhexidine ([place])  0.12 % solution Use as directed 15 mLs in the mouth or throat 2 (two) times daily.    Historical Provider, MD  diltiazem (CARDIZEM CD) 120 MG 24 hr capsule Take 1 capsule (120 mg total) by mouth daily. 02/23/16 05/23/16  Thompson Grayer, MD  feeding supplement, GLUCERNA SHAKE, (GLUCERNA SHAKE) LIQD Take 237 mLs by mouth See admin instructions. One to two times a day    Historical Provider, MD  fluticasone (FLONASE) 50 MCG/ACT nasal spray Place 2 sprays into both nostrils daily. 08/07/14   Gildardo Cranker, DO  furosemide (LASIX) 40 MG tablet Take 1 tablet (40 mg total) by mouth daily. 05/17/16   Rich Fuchs Choi, DO  gabapentin (NEURONTIN) 300 MG capsule Take 1 capsule (300 mg total) by mouth at bedtime. 05/17/16   Rich Fuchs Choi, DO  hydrALAZINE (APRESOLINE) 50 MG tablet Take 1 tablet (50 mg total) by mouth 3 (three) times daily. 11/11/15   Eugenie Filler, MD  insulin aspart (NOVOLOG FLEXPEN) 100 UNIT/ML FlexPen Insulin sliding scale: Blood sugar  120-150   3units                       151-200   4units                       201-250   7units                       251- 300  11units                       301-350   15uints                       351-400   20units                       >400         call MD immediately  05/30/16   Florencia Reasons, MD  Insulin Glargine (LANTUS SOLOSTAR) 100 UNIT/ML Solostar Pen Inject 5 Units into the skin daily at 10 pm. 06/02/16   Gildardo Cranker, DO  Insulin Pen Needle 31G X 5 MM MISC 1 Units by Does not apply route at bedtime. 06/02/16   Gildardo Cranker, DO  ipratropium-albuterol (DUONEB) 0.5-2.5 (3) MG/3ML SOLN Take 3 mLs by nebulization every 6 (six) hours as needed (sob). Use 3 times daily x 4 days then every 6 hours as needed. Patient taking differently: Take 3 mLs by nebulization See admin instructions. Every 4 to 6 hours (up to 6 times a day) 03/21/16   Eileen Stanford, PA-C  isosorbide mononitrate (IMDUR) 30 MG 24 hr tablet TAKE 1 TABLET (30 MG TOTAL) BY MOUTH DAILY. 04/13/16   Dorothy Spark, MD  latanoprost (XALATAN) 0.005 % ophthalmic solution Place 1 drop into both eyes at bedtime. 06/02/14   Blanchie Serve, MD  levalbuterol (XOPENEX HFA) 45 MCG/ACT inhaler Inhale 2 puffs into the lungs every 6 (six) hours as needed for wheezing or shortness of breath. 02/03/16   Gildardo Cranker, DO  levothyroxine (SYNTHROID, LEVOTHROID) 137 MCG tablet TAKE 1 TABLET (137 MCG TOTAL) BY MOUTH DAILY BEFORE BREAKFAST. 04/04/16   Gildardo Cranker, DO  loratadine (CLARITIN) 10 MG tablet Take 10 mg by mouth daily.    Historical Provider, MD  Maltodextrin-Xanthan Gum (RESOURCE THICKENUP CLEAR) POWD Take 120 g by mouth as needed (use to thicken  liquids.). 11/11/15   Eugenie Filler, MD  Multiple Vitamin (MULTIVITAMIN WITH MINERALS) TABS tablet Take 1 tablet by mouth daily.    Historical Provider, MD  Nutritional Supplements (FEEDING SUPPLEMENT, NEPRO CARB STEADY,) LIQD Take 237 mLs by mouth 2 (two) times daily between meals. 05/30/16   Florencia Reasons, MD  Nutritional Supplements (FEEDING SUPPLEMENT, OSMOLITE 1.5 CAL,) LIQD Place 1,000 mLs into feeding tube daily. 09/30/15   Debbe Odea, MD  Polyethyl Glycol-Propyl Glycol 0.4-0.3 % SOLN Place 1 drop into both eyes 4 (four) times daily.     Historical Provider, MD  predniSONE  (DELTASONE) 10 MG tablet Please take 2tabs on day one, then 1 tab on day two, then half tab on day three, then stop. 05/30/16   Florencia Reasons, MD  RESTASIS 0.05 % ophthalmic emulsion USE 1 DROP INTO BOTH EYES TWICE DAILY 11/01/15   Estill Dooms, MD  sertraline (ZOLOFT) 100 MG tablet TAKE ONE TABLET BY MOUTH ONCE DAILY FOR DEPRESSION 04/10/16   Gildardo Cranker, DO  traMADol (ULTRAM) 50 MG tablet Take 50 mg by mouth daily as needed for moderate pain.     Historical Provider, MD  warfarin (COUMADIN) 2.5 MG tablet Take as directed by [facility]. Patient taking differently: Take 2.5 mg by mouth daily.  02/16/16   Dorothy Spark, MD  Water For Irrigation, Sterile (FREE WATER) SOLN Place 230 mLs into feeding tube 3 (three) times daily.    Historical Provider, MD    Family History Family History  Problem Relation Age of Onset  . Stroke Mother   . Hypertension Mother   . Diabetes Brother   . Heart attack Neg Hx     Social History Social History  Substance Use Topics  . Smoking status: Never Smoker  . Smokeless tobacco: Former Systems developer    Types: Snuff     Comment: "used snuff in my younger days"  . Alcohol use No     Allergies   Penicillins   Review of Systems Review of Systems  Constitutional: Positive for fatigue. Negative for chills and fever.  HENT: Negative for congestion, rhinorrhea and sore throat.   Eyes: Negative for visual disturbance.  Respiratory: Negative for cough, shortness of breath and wheezing.   Cardiovascular: Negative for chest pain and leg swelling.  Gastrointestinal: Negative for abdominal pain, diarrhea, nausea and vomiting.  Genitourinary: Negative for dysuria, flank pain, vaginal bleeding and vaginal discharge.  Musculoskeletal: Negative for neck pain.  Skin: Negative for rash.  Allergic/Immunologic: Negative for immunocompromised state.  Neurological: Positive for weakness and light-headedness. Negative for syncope and headaches.  Hematological: Does not  bruise/bleed easily.  All other systems reviewed and are negative.    Physical Exam Updated Vital Signs BP 164/57 (BP Location: Right Arm)   Pulse (!) 46   Temp 97.2 F (36.2 C) (Rectal)   Resp 14   SpO2 100%   Physical Exam  Constitutional: She is oriented to person, place, and time. She appears well-developed and well-nourished. She appears distressed.  HENT:  Head: Normocephalic and atraumatic.  Eyes: Conjunctivae are normal.  Neck: Neck supple.  Cardiovascular: Regular rhythm and normal heart sounds.  Bradycardia present.  Exam reveals decreased pulses. Exam reveals no friction rub.   No murmur heard. Pulses:      Carotid pulses are 1+ on the right side, and 1+ on the left side.      Radial pulses are 1+ on the right side, and 1+ on the left side.  Femoral pulses are 1+ on the right side, and 1+ on the left side.      Popliteal pulses are 1+ on the right side, and 1+ on the left side.       Dorsalis pedis pulses are 1+ on the right side, and 1+ on the left side.       Posterior tibial pulses are 1+ on the right side, and 1+ on the left side.  Pulmonary/Chest: Effort normal and breath sounds normal. No respiratory distress. She has no wheezes. She has no rales.  Abdominal: She exhibits no distension.  Musculoskeletal: She exhibits no edema.  Neurological: She is alert and oriented to person, place, and time. She exhibits normal muscle tone.  Skin: Skin is warm. Capillary refill takes less than 2 seconds.  Cool to touch  Psychiatric: She has a normal mood and affect.  Nursing note and vitals reviewed.    ED Treatments / Results  Labs (all labs ordered are listed, but only abnormal results are displayed) Labs Reviewed  CBC WITH DIFFERENTIAL/PLATELET - Abnormal; Notable for the following:       Result Value   RBC 3.40 (*)    Hemoglobin 9.6 (*)    HCT 28.5 (*)    RDW 20.8 (*)    All other components within normal limits  COMPREHENSIVE METABOLIC PANEL - Abnormal;  Notable for the following:    Chloride 99 (*)    Glucose, Bld 148 (*)    BUN 54 (*)    Creatinine, Ser 1.97 (*)    Calcium 8.7 (*)    Total Protein 5.4 (*)    Albumin 2.8 (*)    GFR calc non Af Amer 21 (*)    GFR calc Af Amer 25 (*)    All other components within normal limits  BRAIN NATRIURETIC PEPTIDE - Abnormal; Notable for the following:    B Natriuretic Peptide 1,268.6 (*)    All other components within normal limits  TSH - Abnormal; Notable for the following:    TSH 10.017 (*)    All other components within normal limits  PROTIME-INR - Abnormal; Notable for the following:    Prothrombin Time 20.9 (*)    All other components within normal limits  I-STAT CHEM 8, ED - Abnormal; Notable for the following:    Chloride 98 (*)    BUN 53 (*)    Creatinine, Ser 2.00 (*)    Glucose, Bld 144 (*)    Calcium, Ion 1.13 (*)    Hemoglobin 10.5 (*)    HCT 31.0 (*)    All other components within normal limits  CBG MONITORING, ED - Abnormal; Notable for the following:    Glucose-Capillary 158 (*)    All other components within normal limits  MAGNESIUM  URINALYSIS, ROUTINE W REFLEX MICROSCOPIC  T4, FREE  I-STAT TROPOININ, ED    EKG  EKG Interpretation  Date/Time:  Thursday June 08 2016 15:14:13 EST Ventricular Rate:  56 PR Interval:    QRS Duration: 81 QT Interval:  484 QTC Calculation: 468 R Axis:   96 Text Interpretation:  Sinus rhythm Anterior infarct, old Since last EKG, sinus bradycardia has replaced junctional rhythm Confirmed by Xayne Brumbaugh MD, Lysbeth Galas [phone]) on 06/08/2016 3:25:23 PM       Radiology Dg Chest Portable 1 View  Result Date: 06/08/2016 CLINICAL DATA:  Bradycardia EXAM: PORTABLE CHEST 1 VIEW COMPARISON:  Chest x-ray of 05/29/2016 FINDINGS: Aeration of the left upper lobe appears to have improved. There is  mild left basilar atelectasis present. Mediastinal and hilar contours are unremarkable. Mild cardiomegaly is stable. No bony abnormality is seen other than  degenerative change particularly in the left shoulder. IMPRESSION: Improved aeration of the left upper lobe. Mild left basilar atelectasis. Electronically Signed   By: Ivar Drape M.D.   On: 06/08/2016 16:39    Procedures .Critical Care Performed by: Duffy Bruce Authorized by: Duffy Bruce   Critical care provider statement:    Critical care time (minutes):  45   Critical care time was exclusive of:  Separately billable procedures and treating other patients   Critical care was necessary to treat or prevent imminent or life-threatening deterioration of the following conditions:  Cardiac failure, circulatory failure and shock   Critical care was time spent personally by me on the following activities:  Blood draw for specimens, development of treatment plan with patient or surrogate, discussions with consultants, evaluation of patient's response to treatment, examination of patient, obtaining history from patient or surrogate, ordering and performing treatments and interventions, ordering and review of laboratory studies, ordering and review of radiographic studies, pulse oximetry, re-evaluation of patient's condition and review of old charts   I assumed direction of critical care for this patient from another provider in my specialty: no      (including critical care time)  Medications Ordered in ED Medications  DOPamine (INTROPIN) 800 mg in dextrose 5 % 250 mL (3.2 mg/mL) infusion (not administered)  sodium chloride 0.9 % bolus 1,000 mL (0 mLs Intravenous Stopped 06/08/16 1624)  calcium gluconate 1 g in sodium chloride 0.9 % 100 mL IVPB (1 g Intravenous New Bag/Given 06/08/16 1648)  atropine 1 MG/10ML injection 0.5 mg (0.5 mg Intravenous Given 06/08/16 1511)  atropine 1 MG/10ML injection 0.5 mg (0.5 mg Intravenous Given 06/08/16 1525)     Initial Impression / Assessment and Plan / ED Course  I have reviewed the triage vital signs and the nursing notes.  Pertinent labs & imaging results  that were available during my care of the patient were reviewed by me and considered in my medical decision making (see chart for details).     83 year old female with past medical history as above who presents with symptomatic bradycardia. On arrival, patient has heart rate to as low as 29 with blood pressure 58 over 30s. Given her symptomatically bradycardia, patient given atropine 0.5 mg 2 with transition from likely junctional rhythm to sinus bradycardia with improvement in blood pressure. We will start the patient on peripheral epinephrine drip. 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 Cardiology paged. Labwork shows mild acute on chronic kidney injury, hypocalcemia, and elevated TSH. Will add on free T4. Otherwise, patient given calcium and maintained on epinephrine drip.  Cardiology has evaluated. Patient would not desire pacemaker and they recommend holding all nodal blockade agents. Intensivist paged and will admit. Patient started on dopamine as she would not desire central line. Otherwise, patient's mentation has markedly improved and her vitals are stable.  Final Clinical Impressions(s) / ED Diagnoses   Final diagnoses:  Symptomatic bradycardia  Hypotension due to drugs  Acute renal failure superimposed on stage 4 chronic kidney disease, unspecified acute renal failure type (HCC)  Elevated TSH    New Prescriptions New Prescriptions   No medications on file     Duffy Bruce, MD 06/08/16 1717

## 2016-06-08 NOTE — Progress Notes (Signed)
ANTICOAGULATION CONSULT NOTE - Initial Consult  Pharmacy Consult for Warfarin Indication: atrial fibrillation  Allergies  Allergen Reactions  . Penicillins Itching, Rash and Other (See Comments)    Has patient had a PCN reaction causing immediate rash, facial/tongue/throat swelling, SOB or lightheadedness with hypotension: Yes Has patient had a PCN reaction causing severe rash involving mucus membranes or skin necrosis: Unk Has patient had a PCN reaction that required hospitalization: Unk Has patient had a PCN reaction occurring within the last 10 years: Unk If all of the above answers are "NO", then may proceed with Cephalosporin use.      Vital Signs: Temp: 97.2 F (36.2 C) (03/08 1605) Temp Source: Rectal (03/08 1605) BP: 164/57 (03/08 1654) Pulse Rate: 46 (03/08 1654)  Labs:  Recent Labs  06/08/16 1523 06/08/16 1541  HGB 9.6* 10.5*  HCT 28.5* 31.0*  PLT 176  --   LABPROT 20.9*  --   INR 1.78  --   CREATININE 1.97* 2.00*    Estimated Creatinine Clearance: 17.5 mL/min (by C-G formula based on SCr of 2 mg/dL (H)).   Medical History: Past Medical History:  Diagnosis Date  . Anemia    previous blood transfusions  . Arthritis    "all over"  . Asthma   . Bradycardia    requiring previous d/c of BB and reduction of amiodarone  . CAD (coronary artery disease)    nonobstructive per notes  . Chronic diastolic CHF (congestive heart failure) ([place])   . CKD (chronic kidney disease), stage III   . Complication of blood transfusion    "got the wrong blood type at [facility] in ~ 2015; no adverse reaction that we are aware of"/daughter, [name] (01/27/2016)  . COPD (chronic obstructive pulmonary disease) ([place])   . Depression    "light case"  . DVT (deep venous thrombosis) ([place]) 01/2016   a. LLE DVT 01/2016 - switched from Eliquis to Coumadin.  . Gastric stenosis    a. s/p stomach tube  . GERD (gastroesophageal reflux disease)   . History of blood transfusion    "several" (01/27/2016)  . History of stomach ulcers   . Hyperlipidemia   . Hypertension   . Hypothyroidism   . Paraesophageal hernia   . Perforated gastric ulcer ([place])   . Seasonal allergies   . SIADH (syndrome of inappropriate ADH production) ([place])    [name] 01/10/2015  . Small bowel obstruction    "I don't know how many" (01/11/2015)  . Stroke ([place])    "light one"  . Type II diabetes mellitus ([place])    "related to prednisone use  for > 20 yr; once predinose stopped; no more DM RX" (01/27/2016)  . Ventral hernia with bowel obstruction     Medications:   (Not in a hospital admission) Scheduled:  . atorvastatin  10 mg Oral Daily  . cycloSPORINE  1 drop Both Eyes BID  . gabapentin  300 mg Oral QHS  . hydrocortisone sod succinate (SOLU-CORTEF) inj  50 mg Intravenous Q6H  . insulin aspart  2-6 Units Subcutaneous Q4H  . latanoprost  1 drop Both Eyes QHS  . levothyroxine  75 mcg Intravenous Daily   Infusions:  . sodium chloride    . DOPamine    . glucagon (GLUCAGEN) infusion (for beta blocker/calcium channel blocker overdose)     PRN: sodium chloride Anti-infectives    None      Assessment: Patient is a 46 yof admitted 3/8 w/ near syncope and bradycardia on warfarin  PTA for a fib. Her warfarin has been on hold since 3/5 due to recent supratherapeutic INR, and she was to restart tonight.  INR on admission is subtherapeutic at 1.78, likely due to recently held doses. Hemoglobin low/stable and platelet count within normal limits.   New PTA dose: 2.5mg  Mon, 3.75mg  all other days of the week  Drug interactions: levothyroxine dose was increased for high TSH, which can increase INR.    Goal of Therapy:  INR 2-3 Monitor platelets by anticoagulation protocol: Yes   Plan:  Warfarin 4mg  PO tonight  Daily INR Monitor for signs/symptoms of bleeding  Demetrius Charity, PharmD Acute Care Pharmacy Resident  Pager: [phone] 06/08/2016

## 2016-06-08 NOTE — ED Triage Notes (Signed)
Pt BIB EMS from home. Family noted pt to have a low HR. Per EMS, pt was brady at 37bpm. Pt reports dizziness, off & on, for the past 2 days. Chronic issues of h/a, neck pain. Presents to ED with soft collar that she wears at home. Denies LOC.

## 2016-06-08 NOTE — ED Notes (Signed)
Placed zol pads on pt.

## 2016-06-08 NOTE — ED Notes (Signed)
Pt's Daughter Soledad Gerlach  [phone] (home) [phone] (cell)

## 2016-06-09 ENCOUNTER — Telehealth: Payer: Self-pay

## 2016-06-09 DIAGNOSIS — T461X5A Adverse effect of calcium-channel blockers, initial encounter: Secondary | ICD-10-CM

## 2016-06-09 DIAGNOSIS — T461X5S Adverse effect of calcium-channel blockers, sequela: Secondary | ICD-10-CM

## 2016-06-09 LAB — GLUCOSE, CAPILLARY
Glucose-Capillary: 97 mg/dL (ref 65–99)
Glucose-Capillary: 122 mg/dL — ABNORMAL HIGH (ref 65–99)
Glucose-Capillary: 140 mg/dL — ABNORMAL HIGH (ref 65–99)
Glucose-Capillary: 409 mg/dL — ABNORMAL HIGH (ref 65–99)
Glucose-Capillary: 436 mg/dL — ABNORMAL HIGH (ref 65–99)
Glucose-Capillary: 430 mg/dL — ABNORMAL HIGH (ref 65–99)
Glucose-Capillary: 395 mg/dL — ABNORMAL HIGH (ref 65–99)
Glucose-Capillary: 355 mg/dL — ABNORMAL HIGH (ref 65–99)
Glucose-Capillary: 101 mg/dL — ABNORMAL HIGH (ref 65–99)

## 2016-06-09 LAB — MAGNESIUM: Magnesium: 1.9 mg/dL (ref 1.7–2.4)

## 2016-06-09 LAB — LACTIC ACID, PLASMA: Lactic Acid, Venous: 1 mmol/L (ref 0.5–1.9)

## 2016-06-09 LAB — CBC
HCT: 34.7 % — ABNORMAL LOW (ref 36.0–46.0)
Hemoglobin: 11.9 g/dL — ABNORMAL LOW (ref 12.0–15.0)
MCH: 28.6 pg (ref 26.0–34.0)
MCHC: 34.3 g/dL (ref 30.0–36.0)
MCV: 83.4 fL (ref 78.0–100.0)
Platelets: 196 10*3/uL (ref 150–400)
RBC: 4.16 MIL/uL (ref 3.87–5.11)
RDW: 21 % — ABNORMAL HIGH (ref 11.5–15.5)
WBC: 15.4 10*3/uL — ABNORMAL HIGH (ref 4.0–10.5)

## 2016-06-09 LAB — BASIC METABOLIC PANEL
Anion gap: 9 (ref 5–15)
BUN: 49 mg/dL — ABNORMAL HIGH (ref 6–20)
CO2: 26 mmol/L (ref 22–32)
Calcium: 9.3 mg/dL (ref 8.9–10.3)
Chloride: 103 mmol/L (ref 101–111)
Creatinine, Ser: 1.6 mg/dL — ABNORMAL HIGH (ref 0.44–1.00)
GFR calc Af Amer: 32 mL/min — ABNORMAL LOW (ref >60)
GFR calc non Af Amer: 27 mL/min — ABNORMAL LOW (ref >60)
Glucose, Bld: 191 mg/dL — ABNORMAL HIGH (ref 65–99)
Potassium: 3.9 mmol/L (ref 3.5–5.1)
Sodium: 138 mmol/L (ref 135–145)

## 2016-06-09 LAB — PROTIME-INR
INR: 1.56
Prothrombin Time: 18.8 seconds — ABNORMAL HIGH (ref 11.4–15.2)

## 2016-06-09 LAB — PHOSPHORUS: Phosphorus: 3.3 mg/dL (ref 2.5–4.6)

## 2016-06-09 LAB — MRSA PCR SCREENING: MRSA by PCR: NEGATIVE

## 2016-06-09 MED ORDER — INSULIN GLARGINE 100 UNIT/ML ~~LOC~~ SOLN
10.0000 [IU] | Freq: Every day | SUBCUTANEOUS | Status: DC
  Administered 2016-06-09: 10 [IU] via SUBCUTANEOUS
  Filled 2016-06-09: qty 0.1

## 2016-06-09 MED ORDER — RESOURCE THICKENUP CLEAR PO POWD
ORAL | Status: DC | PRN
  Filled 2016-06-09 (×3): qty 125

## 2016-06-09 MED ORDER — ACETAMINOPHEN 325 MG PO TABS
650.0000 mg | ORAL_TABLET | Freq: Four times a day (QID) | ORAL | Status: DC | PRN
  Administered 2016-06-09 – 2016-06-13 (×5): 650 mg via ORAL
  Filled 2016-06-09 (×6): qty 2

## 2016-06-09 MED ORDER — GLUCAGON HCL RDNA (DIAGNOSTIC) 1 MG IJ SOLR
1.0000 mg/h | INTRAVENOUS | Status: DC
  Administered 2016-06-09 (×2): 1 mg/h via INTRAVENOUS
  Filled 2016-06-09: qty 5

## 2016-06-09 MED ORDER — ORAL CARE MOUTH RINSE
15.0000 mL | Freq: Two times a day (BID) | OROMUCOSAL | Status: DC
  Administered 2016-06-09 – 2016-06-15 (×12): 15 mL via OROMUCOSAL

## 2016-06-09 MED ORDER — NEPRO/CARBSTEADY PO LIQD
237.0000 mL | Freq: Two times a day (BID) | ORAL | Status: DC
  Administered 2016-06-09 – 2016-06-16 (×10): 237 mL via ORAL
  Filled 2016-06-09 (×16): qty 237

## 2016-06-09 MED ORDER — INSULIN ASPART 100 UNIT/ML ~~LOC~~ SOLN
0.0000 [IU] | SUBCUTANEOUS | Status: DC
  Administered 2016-06-09: 20 [IU] via SUBCUTANEOUS
  Administered 2016-06-10: 4 [IU] via SUBCUTANEOUS

## 2016-06-09 NOTE — H&P (Signed)
PULMONARY / CRITICAL CARE MEDICINE   Name: Shannon [name] MRN: [id number] DOB: 1923-04-23    ADMISSION DATE:  06/08/2016 CONSULTATION DATE:  06/08/2016  REFERRING MD:  Dr. Erma Heritage   CHIEF COMPLAINT: Bradycardia    Brief:   83 year old female with PMH of asthma, bradycardia, CAD, Diastolic HF, CKD stage III, COPD, DVT, GERD, HTN, DM type 2, gastric outlet obstruction s/p G-tube placement, and hyperlipidemia. Presents to ED 3/8 with complaints of syncope and weakness. Upon arrival EMS reported HR 30-40s but mentating well. Cardiology was consulted. Recently patient was started on Cardizem and Bisoprolol. Daughter reports that patient has been taking her medications as prescribed since last admission. In ED currently on EPI gtt and receiving dose of calcium gluconate.   Patient had recent admission 2/19-2/22 for dyspnea. During admission was suspected to have CAP and treated with 7 day course of antibiotics. Cardiology stopped amiodarone secondary to suspected lung toxicity  SUBJECTIVE:  Remains dopamine and Glucagon gtt  VITAL SIGNS: BP (!) 146/57   Pulse (!) 51   Temp 98 F (36.7 C) (Oral)   Resp 18   Ht 5\' 6"  (1.676 m)   Wt 64.7 kg (142 lb 10.2 oz)   SpO2 100%   BMI 23.02 kg/m   HEMODYNAMICS:    VENTILATOR SETTINGS:    INTAKE / OUTPUT: I/O last 3 completed shifts: In: 1154.8 [I.V.:64.8; IV Piggyback:1090] Out: 1300 [Urine:1300]  PHYSICAL EXAMINATION: General:  Elderly female, no distress, lying in bed  Neuro:  Alert, oriented, follows commands, Grossly intact   HEENT:  Normocephalic  Cardiovascular:  Huston Foley, Regular Rhythm, no MRG   Lungs:  Clear Breath sounds, non-labored, no wheeze/crackles   Abdomen:  Non-tender, PEG in place  Musculoskeletal: +1 edema to BLE Skin:  Warm, dry, intact   LABS:  BMET  Recent Labs Lab 06/08/16 1523 06/08/16 1541 06/08/16 2359  NA 135 135 138  K 4.4 4.5 3.9  CL 99* 98* 103  CO2 29  --  26  BUN 54* 53* 49*  CREATININE 1.97*  2.00* 1.60*  GLUCOSE 148* 144* 191*    Electrolytes  Recent Labs Lab 06/08/16 1523 06/08/16 2359  CALCIUM 8.7* 9.3  MG 1.9 1.9  PHOS  --  3.3    CBC  Recent Labs Lab 06/08/16 1523 06/08/16 1541 06/08/16 2359  WBC 6.0  --  15.4*  HGB 9.6* 10.5* 11.9*  HCT 28.5* 31.0* 34.7*  PLT 176  --  196    Coag's  Recent Labs Lab 06/05/16 06/08/16 1523  INR 5.6 1.78    Sepsis Markers  Recent Labs Lab 06/08/16 2131 06/08/16 2359  LATICACIDVEN 1.8 1.0    ABG No results for input(s): PHART, PCO2ART, PO2ART in the last 168 hours.  Liver Enzymes  Recent Labs Lab 06/08/16 1523  AST 25  ALT 24  ALKPHOS 53  BILITOT 0.6  ALBUMIN 2.8*    Cardiac Enzymes No results for input(s): TROPONINI, PROBNP in the last 168 hours.  Glucose  Recent Labs Lab 06/08/16 1518 06/08/16 2133 06/08/16 2336 06/09/16 0438 06/09/16 0733  GLUCAP 158* 155* 194* 97 122*    Imaging Dg Chest Portable 1 View  Result Date: 06/08/2016 CLINICAL DATA:  Bradycardia EXAM: PORTABLE CHEST 1 VIEW COMPARISON:  Chest x-ray of 05/29/2016 FINDINGS: Aeration of the left upper lobe appears to have improved. There is mild left basilar atelectasis present. Mediastinal and hilar contours are unremarkable. Mild cardiomegaly is stable. No bony abnormality is seen other than degenerative change particularly  in the left shoulder. IMPRESSION: Improved aeration of the left upper lobe. Mild left basilar atelectasis. Electronically Signed   By: Dwyane Dee M.D.   On: 06/08/2016 16:39     STUDIES:  CXR 3/8 >Improved aeration of left upper lobe, mild left basilar atelectasis   CULTURES: None.   ANTIBIOTICS: None.   SIGNIFICANT EVENTS: 3/8 > ED with bradycardia   LINES/TUBES: None.   DISCUSSION: 83 year old female with extensive medical history as listed above presents with bradycardia, HR 30-40, requiring epi gtt. Cardiology consulted. Suspected secondary to CCB. Will switch EPI to dopamine to maintain  MAP >60.   ASSESSMENT / PLAN:  PULMONARY A: H/O COPD, asthma P:   Maintain Oxygen >92 Pulmonary Hygiene Duoneb PRN  CARDIOVASCULAR A:  Bradycardia secondary to CCB Chronic Diastolic HF Chronic A.fib (on coumadin) H/O HTN, HLD, CAD P:  Cardiac Monitoring  Wean Dopamine to maintain MAP >60 Hold home Bisoprolol and Caredizem  D/C Glucagen gtt  Continue Coumadin and Lipitor   RENAL A:   CKD stage III P:   Trend BMP Replace electrolytes as needed   GASTROINTESTINAL A:   GERD  Gastric Outlet Obstruction s/p PEG  P:   PPI Full Liquid diet ordered  Supplemental Nepro ordered   HEMATOLOGIC A:   Anemia  P:  Trend CBC   INFECTIOUS A:   No issues  P:   Trend WBC and Fever Curve   ENDOCRINE A:   Hypothyroidism  H/O DM 2    P:   SSI Q4H glucose checks  Continue Synthroid Hydrocortisone sodium 50 mg q6h   NEUROLOGIC A:   H/O CVA, Depression  P:   RASS goal: 0 Monitor  Continue Zoloft    FAMILY  - Updates: No family at bedside   - Inter-disciplinary family meet or Palliative Care meeting due by:  06/15/2016  CC Time: 32 mins  If can wean off dopamine will transfer to Telemetry unit this afternoon.   Jovita Kussmaul, AG-ACNP [facility]  Pgr: [phone]  PCCM Pgr: [phone]

## 2016-06-09 NOTE — Progress Notes (Addendum)
Shannon [name] for Warfarin Indication: atrial fibrillation  Allergies  Allergen Reactions  . Penicillins Itching, Rash and Other (See Comments)    Has patient had a PCN reaction causing immediate rash, facial/tongue/throat swelling, SOB or lightheadedness with hypotension: Yes Has patient had a PCN reaction causing severe rash involving mucus membranes or skin necrosis: Unk Has patient had a PCN reaction that required hospitalization: Unk Has patient had a PCN reaction occurring within the last 10 years: Unk If all of the above answers are "NO", then may proceed with Cephalosporin use.      Vital Signs: Temp: 97.9 F (36.6 C) (03/09 1200) Temp Source: Oral (03/09 1200) BP: 149/51 (03/09 1200) Pulse Rate: 51 (03/09 1200)  Labs:  Recent Labs  06/08/16 1523 06/08/16 1541 06/08/16 2359 06/09/16 1109  HGB 9.6* 10.5* 11.9*  --   HCT 28.5* 31.0* 34.7*  --   PLT 176  --  196  --   LABPROT 20.9*  --   --  18.8*  INR 1.78  --   --  1.56  CREATININE 1.97* 2.00* 1.60*  --     Estimated Creatinine Clearance: 21.9 mL/min (by C-G formula based on SCr of 1.6 mg/dL (H)).   Medical History: Past Medical History:  Diagnosis Date  . Anemia    previous blood transfusions  . Arthritis    "all over"  . Asthma   . Bradycardia    requiring previous d/c of BB and reduction of amiodarone  . CAD (coronary artery disease)    nonobstructive per notes  . Chronic diastolic CHF (congestive heart failure) ([place])   . CKD (chronic kidney disease), stage III   . Complication of blood transfusion    "got the wrong blood type at [facility] in ~ 2015; no adverse reaction that we are aware of"/daughter, [name] (01/27/2016)  . COPD (chronic obstructive pulmonary disease) ([place])   . Depression    "light case"  . DVT (deep venous thrombosis) ([place]) 01/2016   a. LLE DVT 01/2016 - switched from Eliquis to Coumadin.  . Gastric stenosis    a. s/p stomach tube  . GERD  (gastroesophageal reflux disease)   . History of blood transfusion    "several" (01/27/2016)  . History of stomach ulcers   . Hyperlipidemia   . Hypertension   . Hypothyroidism   . Paraesophageal hernia   . Perforated gastric ulcer ([place])   . Seasonal allergies   . SIADH (syndrome of inappropriate ADH production) ([place])    [name] 01/10/2015  . Small bowel obstruction    "I don't know how many" (01/11/2015)  . Stroke ([place])    "light one"  . Type II diabetes mellitus ([place])    "related to prednisone use  for > 20 yr; once predinose stopped; no more DM RX" (01/27/2016)  . Ventral hernia with bowel obstruction     Medications:  Prescriptions Prior to Admission  Medication Sig Dispense Refill Last Dose  . acetaminophen (TYLENOL) 325 MG tablet Take 650 mg by mouth 2 (two) times daily as needed for mild pain.    Past Month at Unknown time  . AMINO ACIDS-PROTEIN HYDROLYS PO Take 30 mLs by mouth 2 (two) times daily.   06/08/2016 at Unknown time  . atorvastatin (LIPITOR) 10 MG tablet TAKE ONE TABLET BY MOUTH ONCE DAILY 90 tablet 1 06/07/2016 at Unknown time  . b complex vitamins tablet Take 1 tablet by mouth daily.    06/08/2016 at Unknown time  .  bacitracin-polymyxin b (POLYSPORIN) ophthalmic ointment Place 1 application into both eyes 2 (two) times daily. apply to eye every 12 hours while awake   06/08/2016 at Unknown time  . bisacodyl (DULCOLAX) 10 MG suppository Place 1 suppository (10 mg total) rectally daily as needed for moderate constipation. 12 suppository 0 Past Month at Unknown time  . bisoprolol (ZEBETA) 5 MG tablet Take 2 tablets (10 mg total) by mouth daily. (Patient taking differently: Take 5 mg by mouth 2 (two) times daily. ) 30 tablet 0 06/08/2016 at 1000  . calcium-vitamin D (OSCAL WITH D) 500-200 MG-UNIT per tablet Take 1 tablet by mouth daily with breakfast.    06/08/2016 at Unknown time  . chlorhexidine (PERIDEX) 0.12 % solution Use as directed 15 mLs in the mouth or throat 2 (two) times  daily.   06/08/2016 at Unknown time  . Chlorphen-PE-Acetaminophen (SINUS CONGESTION/PAIN DAY/NGHT PO) Take 1 capsule by mouth daily.   06/08/2016 at Unknown time  . diltiazem (CARDIZEM CD) 120 MG 24 hr capsule Take 1 capsule (120 mg total) by mouth daily. 90 capsule 3 06/08/2016 at Unknown time  . feeding supplement, GLUCERNA SHAKE, (GLUCERNA SHAKE) LIQD Take 237 mLs by mouth daily.    06/07/2016 at Unknown time  . fluticasone (FLONASE) 50 MCG/ACT nasal spray Place 2 sprays into both nostrils daily. 16 g 3 06/08/2016 at Unknown time  . furosemide (LASIX) 40 MG tablet Take 1 tablet (40 mg total) by mouth daily. 30 tablet 0 06/08/2016 at Unknown time  . gabapentin (NEURONTIN) 300 MG capsule Take 1 capsule (300 mg total) by mouth at bedtime. 30 capsule 0 06/07/2016 at Unknown time  . hydrALAZINE (APRESOLINE) 50 MG tablet Take 1 tablet (50 mg total) by mouth 3 (three) times daily. 120 tablet 0 06/08/2016 at Unknown time  . insulin aspart (NOVOLOG FLEXPEN) 100 UNIT/ML FlexPen Insulin sliding scale: Blood sugar  120-150   3units                       151-200   4units                       201-250   7units                       251- 300  11units                       301-350   15uints                       351-400   20units                       >400         call MD immediately 1 pen 1 06/07/2016 at Unknown time  . Insulin Glargine (LANTUS SOLOSTAR) 100 UNIT/ML Solostar Pen Inject 5 Units into the skin daily at 10 pm. 5 pen 6 06/07/2016 at Unknown time  . ipratropium-albuterol (DUONEB) 0.5-2.5 (3) MG/3ML SOLN Take 3 mLs by nebulization every 6 (six) hours as needed (sob). Use 3 times daily x 4 days then every 6 hours as needed. (Patient taking differently: Take 3 mLs by nebulization every 6 (six) hours as needed (sob). ) 360 mL 4 06/07/2016 at Unknown time  . isosorbide mononitrate (IMDUR) 30 MG 24 hr tablet TAKE 1 TABLET (30 MG TOTAL) BY MOUTH DAILY.  90 tablet 3 06/08/2016 at Unknown time  . latanoprost (XALATAN) 0.005 %  ophthalmic solution Place 1 drop into both eyes at bedtime. 2.5 mL 12 06/07/2016 at Unknown time  . levalbuterol (XOPENEX HFA) 45 MCG/ACT inhaler Inhale 2 puffs into the lungs every 6 (six) hours as needed for wheezing or shortness of breath. 1 Inhaler 12 06/08/2016 at Unknown time  . levothyroxine (SYNTHROID, LEVOTHROID) 137 MCG tablet TAKE 1 TABLET (137 MCG TOTAL) BY MOUTH DAILY BEFORE BREAKFAST. 30 tablet 2 06/08/2016 at Unknown time  . Maltodextrin-Xanthan Gum (RESOURCE THICKENUP CLEAR) POWD Take 120 g by mouth as needed (use to thicken liquids.). 20 Can 0 prn  . Multiple Vitamin (MULTIVITAMIN WITH MINERALS) TABS tablet Take 1 tablet by mouth daily.   06/08/2016 at Unknown time  . Nutritional Supplements (FEEDING SUPPLEMENT, OSMOLITE 1.5 CAL,) LIQD Place 1,000 mLs into feeding tube daily. (Patient taking differently: Place 65 mL/hr into feeding tube daily. ) 1000 mL 3 06/08/2016 at Unknown time  . Polyethyl Glycol-Propyl Glycol 0.4-0.3 % SOLN Place 1 drop into both eyes 4 (four) times daily.    06/08/2016 at Unknown time  . RESTASIS 0.05 % ophthalmic emulsion USE 1 DROP INTO BOTH EYES TWICE DAILY 60 mL 0 06/08/2016 at Unknown time  . sertraline (ZOLOFT) 100 MG tablet TAKE ONE TABLET BY MOUTH ONCE DAILY FOR DEPRESSION 90 tablet 1 06/08/2016 at Unknown time  . Water For Irrigation, Sterile (FREE WATER) SOLN Place 230 mLs into feeding tube 3 (three) times daily.   06/08/2016 at Unknown time  . warfarin (COUMADIN) 2.5 MG tablet Take as directed by Coumadin Clinic. (Patient taking differently: Take 2.5 mg by mouth daily. ) 30 tablet 1 on hold   Scheduled:  . atorvastatin  10 mg Per Tube Daily  . cycloSPORINE  1 drop Both Eyes BID  . feeding supplement (NEPRO CARB STEADY)  237 mL Oral BID BM  . gabapentin  300 mg Per Tube QHS  . hydrocortisone sod succinate (SOLU-CORTEF) inj  50 mg Intravenous Q6H  . insulin aspart  2-6 Units Subcutaneous Q4H  . latanoprost  1 drop Both Eyes QHS  . levothyroxine  75 mcg Intravenous  Daily  . sertraline  100 mg Per Tube Daily  . Warfarin - Pharmacist Dosing Inpatient   Does not apply q1800   Infusions:  . DOPamine Stopped (06/09/16 1115)  . glucagon (GLUCAGEN) infusion (for beta blocker/calcium channel blocker overdose) 1 mg/hr (06/09/16 1156)   PRN: sodium chloride, acetaminophen, ipratropium-albuterol, ondansetron (ZOFRAN) IV Anti-infectives    None      Assessment: Patient is a 48 yof admitted 3/8 w/ near syncope and bradycardia on warfarin PTA for a fib. Warfarin on hold since 3/5 due to recent supratherapeutic INR. Warfarin resumed yesterday. INR on admission was subtherapeutic at 1.78, likely due to recently held doses.   INR today is subtherapeutic at 1.56 after warfarin being on hold for 3 days; likely have not seen the full effects last night's dose, but doubt it is sufficient to increase to goal INR. Hemoglobin low/stable and platelet count within normal limits. No bleeding per nurse.   Drug interactions: levothyroxine dose was recently increased for high TSH, which can increase INR.   New PTA dose: 2.5mg  Mon, 3.75mg  all other days of the week  Goal of Therapy:  INR 2-3 Monitor platelets by anticoagulation protocol: Yes   Plan:  Warfarin 6mg  PO tonight  Daily INR Monitor for signs/symptoms of bleeding  Jeananne Rama, PharmD Candidate 06/09/2016  ----------------------------------------------------------------------------------------------------------------------  I have discussed with the student and agree with their findings and plan.   Dierdre Harness, [place], PharmD Clinical Pharmacy Resident [phone] (Pager) 06/09/2016 1:35 PM

## 2016-06-09 NOTE — Progress Notes (Signed)
Progress Note  Patient Name: Shannon [name] Date of Encounter: 06/09/2016  Primary Cardiologist: Meda Coffee   Subjective   83 yo with hx of hypertension, dyslipidemia, CVA, COPD and asthma, chronic atrial fibrillation, diastolic congestive heart failure, diabetes, hypothyroidism, gastric outlet obstruction with prior G tube, who was admitted to [facility] on 06/08/2016 for evaluation of near syncope and bradycardia   Inpatient Medications    Scheduled Meds: . atorvastatin  10 mg Per Tube Daily  . cycloSPORINE  1 drop Both Eyes BID  . gabapentin  300 mg Per Tube QHS  . hydrocortisone sod succinate (SOLU-CORTEF) inj  50 mg Intravenous Q6H  . insulin aspart  2-6 Units Subcutaneous Q4H  . latanoprost  1 drop Both Eyes QHS  . levothyroxine  75 mcg Intravenous Daily  . sertraline  100 mg Per Tube Daily  . Warfarin - Pharmacist Dosing Inpatient   Does not apply q1800   Continuous Infusions: . DOPamine 2.532 mcg/kg/min (06/09/16 0800)  . glucagon (GLUCAGEN) infusion (for beta blocker/calcium channel blocker overdose) 1 mg/hr (06/09/16 0745)   PRN Meds: sodium chloride, ipratropium-albuterol, ondansetron (ZOFRAN) IV   Vital Signs    Vitals:   06/09/16 0500 06/09/16 0600 06/09/16 0735 06/09/16 0800  BP: (!) 165/62 (!) 138/52  (!) 146/57  Pulse: (!) 56 (!) 52  (!) 51  Resp: 15 16  18   Temp:   98 F (36.7 C)   TempSrc:   Oral   SpO2: 100% 100%  100%  Weight:      Height:        Intake/Output Summary (Last 24 hours) at 06/09/16 1026 Last data filed at 06/09/16 0800  Gross per 24 hour  Intake          1160.75 ml  Output             1300 ml  Net          -139.25 ml   Filed Weights   06/08/16 2117 06/09/16 0438  Weight: 142 lb 13.7 oz (64.8 kg) 142 lb 10.2 oz (64.7 kg)    Telemetry    Sinus brady - Personally Reviewed  ECG      Physical Exam   GEN: No acute distress.    Neck: No JVD Cardiac: RRR, no murmurs, rubs, or gallops.  Respiratory: Clear to auscultation  bilaterally. GI: Soft, nontender, non-distended  MS: No edema; No deformity. Neuro:  Nonfocal  Psych: Normal affect   Labs    Chemistry Recent Labs Lab 06/08/16 1523 06/08/16 1541 06/08/16 2359  NA 135 135 138  K 4.4 4.5 3.9  CL 99* 98* 103  CO2 29  --  26  GLUCOSE 148* 144* 191*  BUN 54* 53* 49*  CREATININE 1.97* 2.00* 1.60*  CALCIUM 8.7*  --  9.3  PROT 5.4*  --   --   ALBUMIN 2.8*  --   --   AST 25  --   --   ALT 24  --   --   ALKPHOS 53  --   --   BILITOT 0.6  --   --   GFRNONAA 21*  --  27*  GFRAA 25*  --  32*  ANIONGAP 7  --  9     Hematology Recent Labs Lab 06/08/16 1523 06/08/16 1541 06/08/16 2359  WBC 6.0  --  15.4*  RBC 3.40*  --  4.16  HGB 9.6* 10.5* 11.9*  HCT 28.5* 31.0* 34.7*  MCV 83.8  --  83.4  MCH 28.2  --  28.6  MCHC 33.7  --  34.3  RDW 20.8*  --  21.0*  PLT 176  --  196    Cardiac EnzymesNo results for input(s): TROPONINI in the last 168 hours.  Recent Labs Lab 06/08/16 1539  TROPIPOC 0.03     BNP Recent Labs Lab 06/08/16 1523  BNP 1,268.6*     DDimer No results for input(s): DDIMER in the last 168 hours.   Radiology    Dg Chest Portable 1 View  Result Date: 06/08/2016 CLINICAL DATA:  Bradycardia EXAM: PORTABLE CHEST 1 VIEW COMPARISON:  Chest x-ray of 05/29/2016 FINDINGS: Aeration of the left upper lobe appears to have improved. There is mild left basilar atelectasis present. Mediastinal and hilar contours are unremarkable. Mild cardiomegaly is stable. No bony abnormality is seen other than degenerative change particularly in the left shoulder. IMPRESSION: Improved aeration of the left upper lobe. Mild left basilar atelectasis. Electronically Signed   By: Ivar Drape M.D.   On: 06/08/2016 16:39    Cardiac Studies      Patient Profile     83 y.o. female  With hx of hypertension, dyslipidemia, CVA, COPD and asthma, chronic atrial fibrillation, diastolic congestive heart failure, diabetes, hypothyroidism, gastric outlet  obstruction with prior G tube, who was admitted to [facility] on 06/08/2016 for evaluation of near syncope and bradycardia  Assessment & Plan    1. Bradycardia :  Likely due to bystolic and diltiazem She is better but is still on low dose Dopamine drip and glucagon drip I suspect these drips can be Dc'd tomorrow as the dilt and Bystolic get further out of her system  Tricky situation.   She  Does not want a pacer . Has had episodes of bradycardia and tachycardia .  Continue current meds for now    Signed, Mertie Moores, MD  06/09/2016, 10:26 AM

## 2016-06-09 NOTE — Progress Notes (Signed)
eLink Physician-Brief Progress Note Patient Name: Shannon [name] DOB: 1924-02-04 MRN: [id number]   Date of Service  06/09/2016  HPI/Events of Note  CBgs high now 400s, on diet, remains on glucagon gtt , HR 50s, > 24 h since admit  eICU Interventions  Taper glucagon to off over next 4h - cardizem / bystolic should be off her system SSI resistant with 10 u lantus (on 5 at home ) Avoid insulin gtt here     Intervention Category Major Interventions: Hyperglycemia - active titration of insulin therapy  [name] V. 06/09/2016, 6:17 PM

## 2016-06-09 NOTE — Telephone Encounter (Signed)
Occupational therapist with [facility] called to inform Shannon Cranker, DO that patient missed visit today due to hospitalization   No return call needed.  Message forwarded

## 2016-06-09 NOTE — Progress Notes (Signed)
[facility]  Patient Status: Active (receiving services up to time of hospitalization)  [facility] is providing the following services: RN, PT, OT and MSW  If patient discharges after hours, please call [phone].   Shannon [name] 06/09/2016, 11:28 AM

## 2016-06-09 NOTE — Progress Notes (Signed)
[facility]  Patient Status: [place] effective 03/10/2016  [facility] is providing the following services: RN PT  If patient discharges after hours, please call [phone].   [place], SW Navigator 06/09/2016, 1:49 PM  [phone]

## 2016-06-10 DIAGNOSIS — N183 Chronic kidney disease, stage 3 (moderate): Secondary | ICD-10-CM

## 2016-06-10 DIAGNOSIS — I5032 Chronic diastolic (congestive) heart failure: Secondary | ICD-10-CM

## 2016-06-10 DIAGNOSIS — I10 Essential (primary) hypertension: Secondary | ICD-10-CM

## 2016-06-10 LAB — BASIC METABOLIC PANEL
Anion gap: 10 (ref 5–15)
BUN: 52 mg/dL — ABNORMAL HIGH (ref 6–20)
CO2: 24 mmol/L (ref 22–32)
Calcium: 8.7 mg/dL — ABNORMAL LOW (ref 8.9–10.3)
Chloride: 100 mmol/L — ABNORMAL LOW (ref 101–111)
Creatinine, Ser: 1.3 mg/dL — ABNORMAL HIGH (ref 0.44–1.00)
GFR calc Af Amer: 41 mL/min — ABNORMAL LOW (ref >60)
GFR calc non Af Amer: 35 mL/min — ABNORMAL LOW (ref >60)
Glucose, Bld: 142 mg/dL — ABNORMAL HIGH (ref 65–99)
Potassium: 3.5 mmol/L (ref 3.5–5.1)
Sodium: 134 mmol/L — ABNORMAL LOW (ref 135–145)

## 2016-06-10 LAB — CBC
HCT: 27.5 % — ABNORMAL LOW (ref 36.0–46.0)
Hemoglobin: 9.4 g/dL — ABNORMAL LOW (ref 12.0–15.0)
MCH: 28.2 pg (ref 26.0–34.0)
MCHC: 34.2 g/dL (ref 30.0–36.0)
MCV: 82.6 fL (ref 78.0–100.0)
Platelets: 144 10*3/uL — ABNORMAL LOW (ref 150–400)
RBC: 3.33 MIL/uL — ABNORMAL LOW (ref 3.87–5.11)
RDW: 21.1 % — ABNORMAL HIGH (ref 11.5–15.5)
WBC: 10.4 10*3/uL (ref 4.0–10.5)

## 2016-06-10 LAB — GLUCOSE, CAPILLARY
Glucose-Capillary: 110 mg/dL — ABNORMAL HIGH (ref 65–99)
Glucose-Capillary: 114 mg/dL — ABNORMAL HIGH (ref 65–99)
Glucose-Capillary: 153 mg/dL — ABNORMAL HIGH (ref 65–99)
Glucose-Capillary: 160 mg/dL — ABNORMAL HIGH (ref 65–99)
Glucose-Capillary: 205 mg/dL — ABNORMAL HIGH (ref 65–99)
Glucose-Capillary: 79 mg/dL (ref 65–99)

## 2016-06-10 LAB — PROTIME-INR
INR: 1.75
Prothrombin Time: 20.7 seconds — ABNORMAL HIGH (ref 11.4–15.2)

## 2016-06-10 LAB — PHOSPHORUS: Phosphorus: 3.3 mg/dL (ref 2.5–4.6)

## 2016-06-10 LAB — MAGNESIUM: Magnesium: 2.1 mg/dL (ref 1.7–2.4)

## 2016-06-10 MED ORDER — WARFARIN SODIUM 2 MG PO TABS
4.0000 mg | ORAL_TABLET | Freq: Once | ORAL | Status: AC
Start: 2016-06-10 — End: 2016-06-10
  Administered 2016-06-10: 4 mg via ORAL
  Filled 2016-06-10 (×2): qty 2

## 2016-06-10 MED ORDER — INSULIN GLARGINE 100 UNIT/ML ~~LOC~~ SOLN
5.0000 [IU] | Freq: Every day | SUBCUTANEOUS | Status: DC
  Administered 2016-06-10: 5 [IU] via SUBCUTANEOUS
  Filled 2016-06-10 (×2): qty 0.05

## 2016-06-10 MED ORDER — INSULIN ASPART 100 UNIT/ML ~~LOC~~ SOLN
0.0000 [IU] | Freq: Three times a day (TID) | SUBCUTANEOUS | Status: DC
  Administered 2016-06-10: 2 [IU] via SUBCUTANEOUS
  Administered 2016-06-11: 5 [IU] via SUBCUTANEOUS
  Administered 2016-06-12 – 2016-06-13 (×2): 2 [IU] via SUBCUTANEOUS

## 2016-06-10 MED ORDER — PRO-STAT SUGAR FREE PO LIQD
30.0000 mL | Freq: Every day | ORAL | Status: DC
  Administered 2016-06-10 – 2016-06-14 (×5): 30 mL
  Filled 2016-06-10 (×6): qty 30

## 2016-06-10 MED ORDER — JEVITY 1.2 CAL PO LIQD: 1000.0000 mL | ORAL | Status: DC

## 2016-06-10 MED ORDER — OSMOLITE 1.5 CAL PO LIQD
1000.0000 mL | ORAL | Status: DC
  Administered 2016-06-10 – 2016-06-12 (×3): 1000 mL
  Filled 2016-06-10 (×8): qty 1000

## 2016-06-10 MED ORDER — LEVOTHYROXINE SODIUM 25 MCG PO TABS
137.0000 ug | ORAL_TABLET | Freq: Every day | ORAL | Status: DC
  Administered 2016-06-11 – 2016-06-14 (×4): 137 ug via ORAL
  Filled 2016-06-10 (×4): qty 1

## 2016-06-10 NOTE — Progress Notes (Signed)
[facility] PROGRESS NOTE  Luxe Fomby VQQ:[id number] DOB: 1923-07-21 DOA: 06/08/2016  PCP: Kirt Boys, DO  Brief History/Interval Summary: 83 year old female with PMH of asthma, bradycardia, CAD, Diastolic HF, CKD stage III, COPD, DVT, GERD, HTN, DM type 2, gastric outlet obstruction s/p G-tube placement, and hyperlipidemia. Presented to ED 3/8 with complaints of syncope and weakness. Upon arrival EMS reported HR 30-40s but mentating well. Cardiology was consulted. Recently patient was started on Cardizem and Bisoprolol. Daughter reported that patient has been taking her medications as prescribed since last admission. In ED she was on EPI gtt and receiving dose of calcium gluconate. Patient was admitted to the intensive care unit after being initiated on dopamine infusion as well as glucagon infusion. She was stabilized and then transferred to stepdown unit.  Reason for Visit: Bradycardia  Consultants: Cardiology. Critical care medicine.  Procedures: None  Antibiotics: None  Subjective/Interval History: Patient feels much better. She has a chronic neck pain and wears a neck collar. Denies any chest pain or shortness of breath. Asking about physical therapy.  ROS: Denies any nausea or vomiting.  Objective:  Vital Signs  Vitals:   06/10/16 0600 06/10/16 0630 06/10/16 0735 06/10/16 0800  BP: (!) 160/57 (!) 139/51 137/63 112/84  Pulse: (!) 56 (!) 50 (!) 52 (!) 54  Resp: 18 18 11  (!) 31  Temp:   97.7 F (36.5 C)   TempSrc:   Oral   SpO2: 100% 100% 100% 100%  Weight:      Height:        Intake/Output Summary (Last 24 hours) at 06/10/16 1055 Last data filed at 06/10/16 1047  Gross per 24 hour  Intake              440 ml  Output             1409 ml  Net             -969 ml   Filed Weights   06/08/16 2117 06/09/16 0438 06/10/16 0416  Weight: 64.8 kg (142 lb 13.7 oz) 64.7 kg (142 lb 10.2 oz) 64.9 kg (143 lb)    General appearance: alert, cooperative, appears  stated age and no distress Resp: clear to auscultation bilaterally Cardio: S1, S2, is bradycardic, regular. No S3, S4. No rubs, murmurs, or bruit.  GI: soft, non-tender; bowel sounds normal; no masses,  no organomegaly Extremities: extremities normal, atraumatic, no cyanosis or edema Neurologic: Awake and alert. Oriented 3. No focal neurological deficits.  Lab Results:  Data Reviewed: I have personally reviewed following labs and imaging studies  CBC:  Recent Labs Lab 06/08/16 1523 06/08/16 1541 06/08/16 2359 06/10/16 0317  WBC 6.0  --  15.4* 10.4  NEUTROABS 3.5  --   --   --   HGB 9.6* 10.5* 11.9* 9.4*  HCT 28.5* 31.0* 34.7* 27.5*  MCV 83.8  --  83.4 82.6  PLT 176  --  196 144*    Basic Metabolic Panel:  Recent Labs Lab 06/08/16 1523 06/08/16 1541 06/08/16 2359 06/10/16 0317  NA 135 135 138 134*  K 4.4 4.5 3.9 3.5  CL 99* 98* 103 100*  CO2 29  --  26 24  GLUCOSE 148* 144* 191* 142*  BUN 54* 53* 49* 52*  CREATININE 1.97* 2.00* 1.60* 1.30*  CALCIUM 8.7*  --  9.3 8.7*  MG 1.9  --  1.9 2.1  PHOS  --   --  3.3 3.3    GFR: Estimated  Creatinine Clearance: 26.9 mL/min (by C-G formula based on SCr of 1.3 mg/dL (H)).  Liver Function Tests:  Recent Labs Lab 06/08/16 1523  AST 25  ALT 24  ALKPHOS 53  BILITOT 0.6  PROT 5.4*  ALBUMIN 2.8*    Coagulation Profile:  Recent Labs Lab 06/05/16 06/08/16 1523 06/09/16 1109 06/10/16 0317  INR 5.6 1.78 1.56 1.75     CBG:  Recent Labs Lab 06/09/16 2000 06/09/16 2207 06/10/16 0030 06/10/16 0413 06/10/16 0733  GLUCAP 355* 101* 153* 114* 79     Thyroid Function Tests:  Recent Labs  06/08/16 1523 06/08/16 1727  TSH 10.017*  --   FREET4  --  1.50*    Recent Results (from the past 240 hour(s))  MRSA PCR Screening     Status: None   Collection Time: 06/08/16  9:27 PM  Result Value Ref Range Status   MRSA by PCR NEGATIVE NEGATIVE Final    Comment:        The GeneXpert MRSA Assay (FDA approved  for NASAL specimens only), is one component of a comprehensive MRSA colonization surveillance program. It is not intended to diagnose MRSA infection nor to guide or monitor treatment for MRSA infections.       Radiology Studies: Dg Chest Portable 1 View  Result Date: 06/08/2016 CLINICAL DATA:  Bradycardia EXAM: PORTABLE CHEST 1 VIEW COMPARISON:  Chest x-ray of 05/29/2016 FINDINGS: Aeration of the left upper lobe appears to have improved. There is mild left basilar atelectasis present. Mediastinal and hilar contours are unremarkable. Mild cardiomegaly is stable. No bony abnormality is seen other than degenerative change particularly in the left shoulder. IMPRESSION: Improved aeration of the left upper lobe. Mild left basilar atelectasis. Electronically Signed   By: Dwyane Dee M.D.   On: 06/08/2016 16:39     Medications:  Scheduled: . atorvastatin  10 mg Per Tube Daily  . cycloSPORINE  1 drop Both Eyes BID  . feeding supplement (NEPRO CARB STEADY)  237 mL Oral BID BM  . gabapentin  300 mg Per Tube QHS  . insulin aspart  0-9 Units Subcutaneous TID WC  . insulin glargine  5 Units Subcutaneous QHS  . latanoprost  1 drop Both Eyes QHS  . levothyroxine  75 mcg Intravenous Daily  . mouth rinse  15 mL Mouth Rinse BID  . sertraline  100 mg Per Tube Daily  . warfarin  4 mg Oral ONCE-1800  . Warfarin - Pharmacist Dosing Inpatient   Does not apply q1800   Continuous:  ZOX:WRUEAV chloride, acetaminophen, ipratropium-albuterol, ondansetron (ZOFRAN) IV, RESOURCE THICKENUP CLEAR  Assessment/Plan:  Active Problems:   Calcium channel blocker adverse reaction   Elevated TSH    Symptomatic bradycardia in the setting of tachycardia bradycardia syndrome Patient was recently started on Cardizem and bisoprolol. She came in with symptomatic bradycardia. Patient seen by cardiology. She was placed on dopamine as well as glucagon infusions. Heart rate has stabilized. She has been taken off of these  infusions. Patient has refused pacemaker. Cardiology continues to follow. Avoid rate limiting drugs in the future.  Paroxysmal atrial fibrillation This appears to be stable. Chads2 vascular score of 6.  Continue with anticoagulation with warfarin.  Diabetes mellitus type 2 Patient did have hyperglycemia as a result of glucagon. She was started on Lantus last night. Glucose levels are borderline low off of the glucagon infusion. We'll cut back on her sliding scale insulin as well as Lantus. HbA1c back in February was 8.0.  Chronic diastolic  congestive heart failure Systolic function has been noted to be normal. Currently appears to be well compensated.  Acute on Chronic kidney disease stage III Creatinine was 2 when she was admitted to the hospital. She is making urine. Creatinine has improved. Continue to monitor closely.  Essential hypertension Monitor blood pressures closely.   History of COPD and asthma Stable.  History of gastric outlet obstruction, status post PEG tube She is on tube feedings during the nighttime. This will be initiated.  Hypothyroidism Noted to have elevated TSH and mildly elevated free T4. Will not make any changes to her thyroid medication dose at this time. She is noted to be on intravenous Synthroid here. This can be changed over to oral from tomorrow. Will recommend repeating her thyroid function tests as an outpatient in 3-4 weeks.  Normocytic Anemia Hemoglobin levels noted to be fluctuating. She does appear to have chronic anemia. No overt bleeding. Continue to monitor.  History of stroke Stable. PT evaluation.  History of depression Continue sertraline.  DVT Prophylaxis: On warfarin    Code Status: DO NOT RESUSCITATE  Family Communication: Discussed with the patient  Disposition Plan: Continue management as outlined above. PT evaluation. Can start mobilizing.    LOS: 2 days   [facility]  [facility] Pager [phone] 06/10/2016,  10:55 AM  If 7PM-7AM, please contact night-coverage at [email or web address], password [facility]

## 2016-06-10 NOTE — Progress Notes (Signed)
Initial Nutrition Assessment  INTERVENTION:  Provide Osmolite 1.5@ 54ml/hr via J-tube for 12 hours overnight (2000 hr to 0800 hr) to provide 1170 kcal, 49 grams of protein, and 593 ml of water.  Provide 30 ml Pro-stat via J-tube daily, provides 100 kcal and 15 grams of protein  Provide Nepro Shake po BID, each supplement provides 425 kcal, 19 grams protein, and 173 ml of fluid.     NUTRITION DIAGNOSIS:   Inadequate oral intake related to altered GI function as evidenced by other (see comment) (Full Liquid diet).   GOAL:   Patient will meet greater than or equal to 90% of their needs   MONITOR:   TF tolerance, Skin, Weight trends, PO intake, Labs, I & O's  REASON FOR ASSESSMENT:   Consult Enteral/tube feeding initiation and management  ASSESSMENT:   83 year old female with PMH of asthma, bradycardia, CAD, Diastolic HF, CKD stage III, COPD, DVT, GERD, HTN, DM type 2, gastric outlet obstruction s/p G-tube placement, and hyperlipidemia. Presents to ED 3/8 with complaints of syncope and weakness. Upon arrival EMS reported HR 30-40s but mentating well.  Pt familiar to nutrition team from previous admissions. Pt reports tolerating nocturnal feeds PTA and drinking Glucerna Shake twice daily; she was planning to switch to [facility]. She would like to continue home nutrition plan. Pt receiving bath at time of visit making physical assessment difficult. Mild muscl wasting noted in legs and temple, and mild fat wasting or arms and ribs. Pt's weight has trended up in the past month.   Labs: low calcium, high BUN, low hemoglobin  Diet Order:  Diet full liquid Room service appropriate? Yes; Fluid consistency: Nectar Thick  Skin:  Reviewed, no issues  Last BM:  3/10  Height:   Ht Readings from Last 1 Encounters:  06/08/16 5\' 6"  (1.676 m)    Weight:   Wt Readings from Last 1 Encounters:  06/10/16 143 lb (64.9 kg)    Ideal Body Weight:  59 kg  BMI:  Body mass index is 23.08  kg/m.  Estimated Nutritional Needs:   Kcal:  1450-1650  Protein:  75-90 grams  Fluid:  1.5 L/day  EDUCATION NEEDS:   No education needs identified at this time  Scarlette Ar RD, LDN, CSP Inpatient Clinical Dietitian Pager: [phone] After Hours Pager: [phone]

## 2016-06-10 NOTE — Progress Notes (Signed)
Progress Note  Patient Name: Shannon [name] Date of Encounter: 06/10/2016  Primary Cardiologist: Dr. Ena Dawley  Subjective   No chest pain or shortness of breath. No palpitations. Eating breakfast.  Inpatient Medications    Scheduled Meds: . atorvastatin  10 mg Per Tube Daily  . cycloSPORINE  1 drop Both Eyes BID  . feeding supplement (NEPRO CARB STEADY)  237 mL Oral BID BM  . gabapentin  300 mg Per Tube QHS  . insulin aspart  0-20 Units Subcutaneous Q4H  . insulin glargine  10 Units Subcutaneous QHS  . latanoprost  1 drop Both Eyes QHS  . levothyroxine  75 mcg Intravenous Daily  . mouth rinse  15 mL Mouth Rinse BID  . sertraline  100 mg Per Tube Daily  . Warfarin - Pharmacist Dosing Inpatient   Does not apply q1800    PRN Meds: sodium chloride, acetaminophen, ipratropium-albuterol, ondansetron (ZOFRAN) IV, RESOURCE THICKENUP CLEAR   Vital Signs    Vitals:   06/10/16 0558 06/10/16 0600 06/10/16 0630 06/10/16 0735  BP:  (!) 160/57 (!) 139/51 137/63  Pulse:  (!) 56 (!) 50 (!) 52  Resp:  18 18 11   Temp: 97.6 F (36.4 C)   97.7 F (36.5 C)  TempSrc: Oral   Oral  SpO2:  100% 100% 100%  Weight:      Height:        Intake/Output Summary (Last 24 hours) at 06/10/16 0845 Last data filed at 06/10/16 0630  Gross per 24 hour  Intake              100 ml  Output             1159 ml  Net            -1059 ml   Filed Weights   06/08/16 2117 06/09/16 0438 06/10/16 0416  Weight: 142 lb 13.7 oz (64.8 kg) 142 lb 10.2 oz (64.7 kg) 143 lb (64.9 kg)    Telemetry    Sinus rhythm, no pauses. Personally reviewed.  ECG    Tracing from 06/08/2016 shows sinus bradycardia with poor R wave progression, rule out old anterior infarct pattern. Personally reviewed.  Physical Exam   GEN: Elderly woman. No acute distress.   Neck: No JVD. Cardiac: RRR, soft systolic murmur, no gallop.  Respiratory: Nonlabored. Clear to auscultation bilaterally. GI: Soft, nontender, bowel sounds  present. MS: No edema; No deformity.  Labs    Chemistry Recent Labs Lab 06/08/16 1523 06/08/16 1541 06/08/16 2359 06/10/16 0317  NA 135 135 138 134*  K 4.4 4.5 3.9 3.5  CL 99* 98* 103 100*  CO2 29  --  26 24  GLUCOSE 148* 144* 191* 142*  BUN 54* 53* 49* 52*  CREATININE 1.97* 2.00* 1.60* 1.30*  CALCIUM 8.7*  --  9.3 8.7*  PROT 5.4*  --   --   --   ALBUMIN 2.8*  --   --   --   AST 25  --   --   --   ALT 24  --   --   --   ALKPHOS 53  --   --   --   BILITOT 0.6  --   --   --   GFRNONAA 21*  --  27* 35*  GFRAA 25*  --  32* 41*  ANIONGAP 7  --  9 10     Hematology Recent Labs Lab 06/08/16 1523 06/08/16 1541 06/08/16 2359 06/10/16 1610  WBC 6.0  --  15.4* 10.4  RBC 3.40*  --  4.16 3.33*  HGB 9.6* 10.5* 11.9* 9.4*  HCT 28.5* 31.0* 34.7* 27.5*  MCV 83.8  --  83.4 82.6  MCH 28.2  --  28.6 28.2  MCHC 33.7  --  34.3 34.2  RDW 20.8*  --  21.0* 21.1*  PLT 176  --  196 144*    Cardiac EnzymesNo results for input(s): TROPONINI in the last 168 hours.  Recent Labs Lab 06/08/16 1539  TROPIPOC 0.03     BNP Recent Labs Lab 06/08/16 1523  BNP 1,268.6*     Radiology    Dg Chest Portable 1 View  Result Date: 06/08/2016 CLINICAL DATA:  Bradycardia EXAM: PORTABLE CHEST 1 VIEW COMPARISON:  Chest x-ray of 05/29/2016 FINDINGS: Aeration of the left upper lobe appears to have improved. There is mild left basilar atelectasis present. Mediastinal and hilar contours are unremarkable. Mild cardiomegaly is stable. No bony abnormality is seen other than degenerative change particularly in the left shoulder. IMPRESSION: Improved aeration of the left upper lobe. Mild left basilar atelectasis. Electronically Signed   By: Ivar Drape M.D.   On: 06/08/2016 16:39    Cardiac Studies   Echocardiogram 05/09/2016: Study Conclusions  - Left ventricle: The cavity size was normal. Wall thickness was   normal. Systolic function was normal. The estimated ejection   fraction was in the range  of 60% to 65%. Wall motion was normal;   there were no regional wall motion abnormalities. - Aortic valve: There was trivial regurgitation. - Left atrium: The atrium was mildly dilated. Volume/bsa, ES,   (1-plane Simpson&'s, A2C): 45.6 ml/m^2. - Right atrium: The atrium was moderately dilated. - Tricuspid valve: There was moderate regurgitation. - Pulmonary arteries: Systolic pressure was mildly increased. PA   peak pressure: 31 mm Hg (S).  Impressions:  - No cardiac source of emboli was indentified.  Patient Profile     82 y.o. female with hypertension, hyperlipidemia, previous stroke, COPD and asthma, chronic atrial fibrillation, type 2 diabetes mellitus, hypothyroidism, gastric outlet obstruction with previous PEG tube, and diastolic heart failure. She is currently admitted with near-syncope and bradycardia.  Assessment & Plan    1. Symptomatic bradycardia in the setting of tachycardia-bradycardia syndrome with PAF. She has been taken off of diltiazem and bisoprolol. Was temporarily on dopamine and glucagon, drips of been discontinued within the last 24 hours. Heart rate stable at this time.  2. Paroxysmal atrial fibrillation, CHADSVASC score of 6. She is currently on Coumadin. At this point not on any heart rate lowering medications and does have a prior history of tachycardia with arrhythmia. She has declined pacemaker.  3. Chronic diastolic heart failure, stable at this time. LVEF 60-65% by recent assessment.  4. CKD stage 3 with recent acute exacerbation. Creatinine down to 1.3.  5. Essential hypertension, systolic blood pressure 062I to 150s.  Patient stable for transfer to stepdown. She has been taken off of dopamine and glucagon. Continue Coumadin and statin therapy. She is not on any AV nodal blockers. Hopefully if she has recurrent atrial fibrillation, rate control will not be a major issue since she does not want to proceed with pacemaker and already has documented  symptomatic bradycardia on heart rate lowering agents. Pindolol might be an option for her.  Signed, Rozann Lesches, MD  06/10/2016, 8:45 AM

## 2016-06-10 NOTE — Progress Notes (Signed)
[place] for Warfarin Indication: atrial fibrillation  Allergies  Allergen Reactions  . Penicillins Itching, Rash and Other (See Comments)    Has patient had a PCN reaction causing immediate rash, facial/tongue/throat swelling, SOB or lightheadedness with hypotension: Yes Has patient had a PCN reaction causing severe rash involving mucus membranes or skin necrosis: Unk Has patient had a PCN reaction that required hospitalization: Unk Has patient had a PCN reaction occurring within the last 10 years: Unk If all of the above answers are "NO", then may proceed with Cephalosporin use.      Vital Signs: Temp: 97.7 F (36.5 C) (03/10 0735) Temp Source: Oral (03/10 0735) BP: 137/63 (03/10 0735) Pulse Rate: 52 (03/10 0735)  Labs:  Recent Labs  06/08/16 1523 06/08/16 1541 06/08/16 2359 06/09/16 1109 06/10/16 0317  HGB 9.6* 10.5* 11.9*  --  9.4*  HCT 28.5* 31.0* 34.7*  --  27.5*  PLT 176  --  196  --  144*  LABPROT 20.9*  --   --  18.8* 20.7*  INR 1.78  --   --  1.56 1.75  CREATININE 1.97* 2.00* 1.60*  --  1.30*    Estimated Creatinine Clearance: 26.9 mL/min (by C-G formula based on SCr of 1.3 mg/dL (H)).   Medical History: Past Medical History:  Diagnosis Date  . Anemia    previous blood transfusions  . Arthritis    "all over"  . Asthma   . Bradycardia    requiring previous d/c of BB and reduction of amiodarone  . CAD (coronary artery disease)    nonobstructive per notes  . Chronic diastolic CHF (congestive heart failure) ([place])   . CKD (chronic kidney disease), stage III   . Complication of blood transfusion    "got the wrong blood type at [facility] in ~ 2015; no adverse reaction that we are aware of"/daughter, [name] (01/27/2016)  . COPD (chronic obstructive pulmonary disease) ([place])   . Depression    "light case"  . DVT (deep venous thrombosis) ([place]) 01/2016   a. LLE DVT 01/2016 - switched from Eliquis to Coumadin.  .  Gastric stenosis    a. s/p stomach tube  . GERD (gastroesophageal reflux disease)   . History of blood transfusion    "several" (01/27/2016)  . History of stomach ulcers   . Hyperlipidemia   . Hypertension   . Hypothyroidism   . Paraesophageal hernia   . Perforated gastric ulcer ([place])   . Seasonal allergies   . SIADH (syndrome of inappropriate ADH production) ([place])    [name] 01/10/2015  . Small bowel obstruction    "I don't know how many" (01/11/2015)  . Stroke ([place])    "light one"  . Type II diabetes mellitus ([place])    "related to prednisone use  for > 20 yr; once predinose stopped; no more DM RX" (01/27/2016)  . Ventral hernia with bowel obstruction     Medications:  Prescriptions Prior to Admission  Medication Sig Dispense Refill Last Dose  . acetaminophen (TYLENOL) 325 MG tablet Take 650 mg by mouth 2 (two) times daily as needed for mild pain.    Past Month at Unknown time  . AMINO ACIDS-PROTEIN HYDROLYS PO Take 30 mLs by mouth 2 (two) times daily.   06/08/2016 at Unknown time  . atorvastatin (LIPITOR) 10 MG tablet TAKE ONE TABLET BY MOUTH ONCE DAILY 90 tablet 1 06/07/2016 at Unknown time  . b complex vitamins tablet Take 1 tablet by mouth  daily.    06/08/2016 at Unknown time  . bacitracin-polymyxin b (POLYSPORIN) ophthalmic ointment Place 1 application into both eyes 2 (two) times daily. apply to eye every 12 hours while awake   06/08/2016 at Unknown time  . bisacodyl (DULCOLAX) 10 MG suppository Place 1 suppository (10 mg total) rectally daily as needed for moderate constipation. 12 suppository 0 Past Month at Unknown time  . bisoprolol (ZEBETA) 5 MG tablet Take 2 tablets (10 mg total) by mouth daily. (Patient taking differently: Take 5 mg by mouth 2 (two) times daily. ) 30 tablet 0 06/08/2016 at 1000  . calcium-vitamin D (OSCAL WITH D) 500-200 MG-UNIT per tablet Take 1 tablet by mouth daily with breakfast.    06/08/2016 at Unknown time  . chlorhexidine (PERIDEX) 0.12 % solution Use as  directed 15 mLs in the mouth or throat 2 (two) times daily.   06/08/2016 at Unknown time  . Chlorphen-PE-Acetaminophen (SINUS CONGESTION/PAIN DAY/NGHT PO) Take 1 capsule by mouth daily.   06/08/2016 at Unknown time  . diltiazem (CARDIZEM CD) 120 MG 24 hr capsule Take 1 capsule (120 mg total) by mouth daily. 90 capsule 3 06/08/2016 at Unknown time  . feeding supplement, GLUCERNA SHAKE, (GLUCERNA SHAKE) LIQD Take 237 mLs by mouth daily.    06/07/2016 at Unknown time  . fluticasone (FLONASE) 50 MCG/ACT nasal spray Place 2 sprays into both nostrils daily. 16 g 3 06/08/2016 at Unknown time  . furosemide (LASIX) 40 MG tablet Take 1 tablet (40 mg total) by mouth daily. 30 tablet 0 06/08/2016 at Unknown time  . gabapentin (NEURONTIN) 300 MG capsule Take 1 capsule (300 mg total) by mouth at bedtime. 30 capsule 0 06/07/2016 at Unknown time  . hydrALAZINE (APRESOLINE) 50 MG tablet Take 1 tablet (50 mg total) by mouth 3 (three) times daily. 120 tablet 0 06/08/2016 at Unknown time  . insulin aspart (NOVOLOG FLEXPEN) 100 UNIT/ML FlexPen Insulin sliding scale: Blood sugar  120-150   3units                       151-200   4units                       201-250   7units                       251- 300  11units                       301-350   15uints                       351-400   20units                       >400         call MD immediately 1 pen 1 06/07/2016 at Unknown time  . Insulin Glargine (LANTUS SOLOSTAR) 100 UNIT/ML Solostar Pen Inject 5 Units into the skin daily at 10 pm. 5 pen 6 06/07/2016 at Unknown time  . ipratropium-albuterol (DUONEB) 0.5-2.5 (3) MG/3ML SOLN Take 3 mLs by nebulization every 6 (six) hours as needed (sob). Use 3 times daily x 4 days then every 6 hours as needed. (Patient taking differently: Take 3 mLs by nebulization every 6 (six) hours as needed (sob). ) 360 mL 4 06/07/2016 at Unknown time  . isosorbide mononitrate (IMDUR) 30 MG 24 hr  tablet TAKE 1 TABLET (30 MG TOTAL) BY MOUTH DAILY. 90 tablet 3 06/08/2016 at  Unknown time  . latanoprost (XALATAN) 0.005 % ophthalmic solution Place 1 drop into both eyes at bedtime. 2.5 mL 12 06/07/2016 at Unknown time  . levalbuterol (XOPENEX HFA) 45 MCG/ACT inhaler Inhale 2 puffs into the lungs every 6 (six) hours as needed for wheezing or shortness of breath. 1 Inhaler 12 06/08/2016 at Unknown time  . levothyroxine (SYNTHROID, LEVOTHROID) 137 MCG tablet TAKE 1 TABLET (137 MCG TOTAL) BY MOUTH DAILY BEFORE BREAKFAST. 30 tablet 2 06/08/2016 at Unknown time  . Maltodextrin-Xanthan Gum (RESOURCE THICKENUP CLEAR) POWD Take 120 g by mouth as needed (use to thicken liquids.). 20 Can 0 prn  . Multiple Vitamin (MULTIVITAMIN WITH MINERALS) TABS tablet Take 1 tablet by mouth daily.   06/08/2016 at Unknown time  . Nutritional Supplements (FEEDING SUPPLEMENT, OSMOLITE 1.5 CAL,) LIQD Place 1,000 mLs into feeding tube daily. (Patient taking differently: Place 65 mL/hr into feeding tube daily. ) 1000 mL 3 06/08/2016 at Unknown time  . Polyethyl Glycol-Propyl Glycol 0.4-0.3 % SOLN Place 1 drop into both eyes 4 (four) times daily.    06/08/2016 at Unknown time  . RESTASIS 0.05 % ophthalmic emulsion USE 1 DROP INTO BOTH EYES TWICE DAILY 60 mL 0 06/08/2016 at Unknown time  . sertraline (ZOLOFT) 100 MG tablet TAKE ONE TABLET BY MOUTH ONCE DAILY FOR DEPRESSION 90 tablet 1 06/08/2016 at Unknown time  . Water For Irrigation, Sterile (FREE WATER) SOLN Place 230 mLs into feeding tube 3 (three) times daily.   06/08/2016 at Unknown time  . warfarin (COUMADIN) 2.5 MG tablet Take as directed by [facility]. (Patient taking differently: Take 2.5 mg by mouth daily. ) 30 tablet 1 on hold   Scheduled:  . atorvastatin  10 mg Per Tube Daily  . cycloSPORINE  1 drop Both Eyes BID  . feeding supplement (NEPRO CARB STEADY)  237 mL Oral BID BM  . gabapentin  300 mg Per Tube QHS  . insulin aspart  0-20 Units Subcutaneous Q4H  . insulin glargine  10 Units Subcutaneous QHS  . latanoprost  1 drop Both Eyes QHS  .  levothyroxine  75 mcg Intravenous Daily  . mouth rinse  15 mL Mouth Rinse BID  . sertraline  100 mg Per Tube Daily  . warfarin  4 mg Oral ONCE-1800  . Warfarin - Pharmacist Dosing Inpatient   Does not apply q1800   Infusions:   PRN: sodium chloride, acetaminophen, ipratropium-albuterol, ondansetron (ZOFRAN) IV, RESOURCE THICKENUP CLEAR Anti-infectives    None      Assessment: Patient is a 43 yof admitted 3/8 w/ near syncope and bradycardia on warfarin PTA for a fib. Warfarin on hold since 3/5 due to recent supratherapeutic INR. Warfarin resumed 3/8.  INR on admission was subtherapeutic at 1.78, likely due to recently held doses.   INR 1.7> 1.6> 1.7 s/p warfarin 4mg  x1 3/8 but missed dose 3/9 - will continue with small boost 4mg  tonight. Hemoglobin slight drop - watch and platelet count ok. No bleeding per nurse.   New PTA dose: 2.5mg  Mon, 3.75mg  all other days of the week  Goal of Therapy:  INR 2-3 Monitor platelets by anticoagulation protocol: Yes   Plan:  Warfarin 4mg  PO tonight  Daily INR Monitor for signs/symptoms of bleeding  Bonnita Nasuti Pharm.D. CPP, BCPS Clinical Pharmacist [phone] 06/10/2016 9:04 AM

## 2016-06-11 LAB — BASIC METABOLIC PANEL
Anion gap: 7 (ref 5–15)
BUN: 41 mg/dL — ABNORMAL HIGH (ref 6–20)
CO2: 25 mmol/L (ref 22–32)
Calcium: 8.8 mg/dL — ABNORMAL LOW (ref 8.9–10.3)
Chloride: 105 mmol/L (ref 101–111)
Creatinine, Ser: 1.3 mg/dL — ABNORMAL HIGH (ref 0.44–1.00)
GFR calc Af Amer: 41 mL/min — ABNORMAL LOW (ref >60)
GFR calc non Af Amer: 35 mL/min — ABNORMAL LOW (ref >60)
Glucose, Bld: 265 mg/dL — ABNORMAL HIGH (ref 65–99)
Potassium: 4.3 mmol/L (ref 3.5–5.1)
Sodium: 137 mmol/L (ref 135–145)

## 2016-06-11 LAB — GLUCOSE, CAPILLARY
Glucose-Capillary: 104 mg/dL — ABNORMAL HIGH (ref 65–99)
Glucose-Capillary: 178 mg/dL — ABNORMAL HIGH (ref 65–99)
Glucose-Capillary: 258 mg/dL — ABNORMAL HIGH (ref 65–99)
Glucose-Capillary: 288 mg/dL — ABNORMAL HIGH (ref 65–99)
Glucose-Capillary: 54 mg/dL — ABNORMAL LOW (ref 65–99)
Glucose-Capillary: 68 mg/dL (ref 65–99)

## 2016-06-11 LAB — CBC
HCT: 29.5 % — ABNORMAL LOW (ref 36.0–46.0)
Hemoglobin: 9.9 g/dL — ABNORMAL LOW (ref 12.0–15.0)
MCH: 28.4 pg (ref 26.0–34.0)
MCHC: 33.6 g/dL (ref 30.0–36.0)
MCV: 84.8 fL (ref 78.0–100.0)
Platelets: 158 10*3/uL (ref 150–400)
RBC: 3.48 MIL/uL — ABNORMAL LOW (ref 3.87–5.11)
RDW: 21.9 % — ABNORMAL HIGH (ref 11.5–15.5)
WBC: 5.8 10*3/uL (ref 4.0–10.5)

## 2016-06-11 LAB — PROTIME-INR
INR: 1.92
Prothrombin Time: 22.3 seconds — ABNORMAL HIGH (ref 11.4–15.2)

## 2016-06-11 MED ORDER — WARFARIN SODIUM 4 MG PO TABS
4.0000 mg | ORAL_TABLET | Freq: Once | ORAL | Status: AC
  Administered 2016-06-11: 4 mg via ORAL
  Filled 2016-06-11: qty 1

## 2016-06-11 MED ORDER — AMLODIPINE BESYLATE 5 MG PO TABS
5.0000 mg | ORAL_TABLET | Freq: Every day | ORAL | Status: DC
  Administered 2016-06-11: 5 mg via ORAL
  Filled 2016-06-11: qty 1

## 2016-06-11 MED ORDER — INSULIN GLARGINE 100 UNIT/ML ~~LOC~~ SOLN
8.0000 [IU] | Freq: Every day | SUBCUTANEOUS | Status: DC
  Administered 2016-06-11 – 2016-06-12 (×2): 8 [IU] via SUBCUTANEOUS
  Filled 2016-06-11 (×3): qty 0.08

## 2016-06-11 MED ORDER — POLYETHYLENE GLYCOL 3350 17 G PO PACK
17.0000 g | PACK | Freq: Every day | ORAL | Status: DC
  Administered 2016-06-11: 17 g via ORAL
  Filled 2016-06-11 (×3): qty 1

## 2016-06-11 MED ORDER — SENNA 8.6 MG PO TABS
1.0000 | ORAL_TABLET | Freq: Every day | ORAL | Status: DC
  Administered 2016-06-11 – 2016-06-14 (×3): 8.6 mg via ORAL
  Filled 2016-06-11 (×4): qty 1

## 2016-06-11 NOTE — Progress Notes (Signed)
[facility] PROGRESS NOTE  Iridessa Heidebrink SAY:[id number] DOB: 01-26-1924 DOA: 06/08/2016  PCP: Kirt Boys, DO  Brief History/Interval Summary: 83 year old female with PMH of asthma, bradycardia, CAD, Diastolic HF, CKD stage III, COPD, DVT, GERD, HTN, DM type 2, gastric outlet obstruction s/p G-tube placement, and hyperlipidemia. Presented to ED 3/8 with complaints of syncope and weakness. Upon arrival EMS reported HR 30-40s but mentating well. Cardiology was consulted. Recently patient was started on Cardizem and Bisoprolol. Daughter reported that patient has been taking her medications as prescribed since last admission. In ED she was on EPI gtt and receiving dose of calcium gluconate. Patient was admitted to the intensive care unit after being initiated on dopamine infusion as well as glucagon infusion. She was stabilized and then transferred to stepdown unit.  Reason for Visit: Bradycardia  Consultants: Cardiology. Critical care medicine.  Procedures: None  Antibiotics: None  Subjective/Interval History: Patient is feeling much better. She denies any chest pain or shortness of breath. No dizziness or lightheadedness. Complains of constipation. No BMs in 2-3 days.  ROS: Denies any nausea or vomiting.  Objective:  Vital Signs  Vitals:   06/10/16 1921 06/11/16 0034 06/11/16 0440 06/11/16 0700  BP: (!) 137/114 (!) 179/78 (!) 170/61   Pulse: (!) 56 70 (!) 59 (!) 58  Resp: 18 19 15 18   Temp: 98.9 F (37.2 C) 99 F (37.2 C) 98.6 F (37 C)   TempSrc: Oral Oral Oral   SpO2: 100% 96% 97% 98%  Weight:   60.8 kg (134 lb 0.6 oz)   Height:        Intake/Output Summary (Last 24 hours) at 06/11/16 0747 Last data filed at 06/11/16 0600  Gross per 24 hour  Intake             1075 ml  Output              450 ml  Net              625 ml   Filed Weights   06/10/16 0416 06/10/16 1833 06/11/16 0440  Weight: 64.9 kg (143 lb) 61 kg (134 lb 7.7 oz) 60.8 kg (134 lb 0.6 oz)     General appearance: alert, cooperative, appears stated age and no distress Resp: clear to auscultation bilaterally Cardio: S1, S2, is bradycardic, regular. No S3, S4. No rubs, murmurs, or bruit.  GI: soft, non-tender; bowel sounds normal; no masses,  no organomegaly Extremities: No edema Neurologic: Awake and alert. Oriented 3. No focal neurological deficits.  Lab Results:  Data Reviewed: I have personally reviewed following labs and imaging studies  CBC:  Recent Labs Lab 06/08/16 1523 06/08/16 1541 06/08/16 2359 06/10/16 0317 06/11/16 0503  WBC 6.0  --  15.4* 10.4 5.8  NEUTROABS 3.5  --   --   --   --   HGB 9.6* 10.5* 11.9* 9.4* 9.9*  HCT 28.5* 31.0* 34.7* 27.5* 29.5*  MCV 83.8  --  83.4 82.6 84.8  PLT 176  --  196 144* 158    Basic Metabolic Panel:  Recent Labs Lab 06/08/16 1523 06/08/16 1541 06/08/16 2359 06/10/16 0317 06/11/16 0503  NA 135 135 138 134* 137  K 4.4 4.5 3.9 3.5 4.3  CL 99* 98* 103 100* 105  CO2 29  --  26 24 25   GLUCOSE 148* 144* 191* 142* 265*  BUN 54* 53* 49* 52* 41*  CREATININE 1.97* 2.00* 1.60* 1.30* 1.30*  CALCIUM 8.7*  --  9.3  8.7* 8.8*  MG 1.9  --  1.9 2.1  --   PHOS  --   --  3.3 3.3  --     GFR: Estimated Creatinine Clearance: 26.9 mL/min (by C-G formula based on SCr of 1.3 mg/dL (H)).  Liver Function Tests:  Recent Labs Lab 06/08/16 1523  AST 25  ALT 24  ALKPHOS 53  BILITOT 0.6  PROT 5.4*  ALBUMIN 2.8*    Coagulation Profile:  Recent Labs Lab 06/05/16 06/08/16 1523 06/09/16 1109 06/10/16 0317 06/11/16 0503  INR 5.6 1.78 1.56 1.75 1.92     CBG:  Recent Labs Lab 06/10/16 1200 06/10/16 1621 06/10/16 2119 06/11/16 0033 06/11/16 0442  GLUCAP 160* 110* 205* 288* 258*     Thyroid Function Tests:  Recent Labs  06/08/16 1523 06/08/16 1727  TSH 10.017*  --   FREET4  --  1.50*    Recent Results (from the past 240 hour(s))  MRSA PCR Screening     Status: None   Collection Time: 06/08/16  9:27  PM  Result Value Ref Range Status   MRSA by PCR NEGATIVE NEGATIVE Final    Comment:        The GeneXpert MRSA Assay (FDA approved for NASAL specimens only), is one component of a comprehensive MRSA colonization surveillance program. It is not intended to diagnose MRSA infection nor to guide or monitor treatment for MRSA infections.       Radiology Studies: No results found.   Medications:  Scheduled: . atorvastatin  10 mg Per Tube Daily  . cycloSPORINE  1 drop Both Eyes BID  . feeding supplement (NEPRO CARB STEADY)  237 mL Oral BID BM  . feeding supplement (OSMOLITE 1.5 CAL)  1,000 mL Per Tube Q24H  . feeding supplement (PRO-STAT SUGAR FREE 64)  30 mL Per Tube Daily  . gabapentin  300 mg Per Tube QHS  . insulin aspart  0-9 Units Subcutaneous TID WC  . insulin glargine  8 Units Subcutaneous QHS  . latanoprost  1 drop Both Eyes QHS  . levothyroxine  137 mcg Oral QAC breakfast  . mouth rinse  15 mL Mouth Rinse BID  . polyethylene glycol  17 g Oral Daily  . senna  1 tablet Oral Daily  . sertraline  100 mg Per Tube Daily  . warfarin  4 mg Oral ONCE-1800  . Warfarin - Pharmacist Dosing Inpatient   Does not apply q1800   Continuous:  QMV:HQIONG chloride, acetaminophen, ipratropium-albuterol, ondansetron (ZOFRAN) IV, RESOURCE THICKENUP CLEAR  Assessment/Plan:  Principal Problem:   Symptomatic bradycardia Active Problems:   Diabetes mellitus type 2, noninsulin dependent (HCC)   Hypothyroidism   Chronic kidney insufficiency, stage 3 (moderate)   PEG (percutaneous endoscopic gastrostomy) status ([facility])   Calcium channel blocker adverse reaction   Elevated TSH    Symptomatic bradycardia in the setting of tachycardia bradycardia syndrome Patient was recently started on Cardizem and bisoprolol. She came in with symptomatic bradycardia. Patient seen by cardiology. She was placed on dopamine as well as glucagon infusions. Heart rate has stabilized. She has been taken off of  these infusions. Patient has refused pacemaker. Cardiology continues to follow. Avoid rate limiting drugs in the future.Patient remains stable.  Paroxysmal atrial fibrillation This appears to be stable. Chads2 vascular score of 6. Continue with anticoagulation with warfarin.  Diabetes mellitus type 2 Patient did have hyperglycemia as a result of glucagon. She was started on Lantus. She was also started on her tube feedings last night.  Hyperglycemia this morning, probably a result of that. Slightly increase the dose of Lantus today. Continue with sliding scale coverage. HbA1c back in February was 8.0.  Chronic diastolic congestive heart failure Systolic function has been noted to be normal. Currently appears to be well compensated.  Acute on Chronic kidney disease stage III Creatinine was 2 when she was admitted to the hospital. She is making urine. Creatinine has improved. Continue to monitor closely.  Essential hypertension Blood pressure noted to be in the hypertensive range. Discussed with cardiology. Will initiate amlodipine. Avoid AV nodal blocking agents.  History of COPD and asthma Stable.  History of gastric outlet obstruction, status post PEG tube She is on tube feedings during the nighttime. Is on full liquid diet during the daytime with nectar thick liquids.  Hypothyroidism Noted to have elevated TSH and mildly elevated free T4. Will not make any changes to her thyroid medication dose at this time. Continue current dose of levothyroxine. Will recommend repeating her thyroid function tests as an outpatient in 3-4 weeks.  Normocytic Anemia Hemoglobin levels noted to be fluctuating. She does appear to have chronic anemia. No overt bleeding. Continue to monitor.  History of stroke Stable. PT evaluation.  History of depression Continue sertraline.  Constipation Initiate laxatives.  DVT Prophylaxis: On warfarin    Code Status: DO NOT RESUSCITATE  Family Communication:  Discussed with the patient  Disposition Plan: Continue management as outlined above. PT evaluation. Can start mobilizing.    LOS: 3 days   [facility]  [facility] Pager [phone] 06/11/2016, 7:47 AM  If 7PM-7AM, please contact night-coverage at [email or web address], password [facility]

## 2016-06-11 NOTE — Discharge Instructions (Signed)
Information on my medicine - Coumadin®   (Warfarin) ° °This medication education was reviewed with me or my healthcare representative as part of my discharge preparation.  The pharmacist that spoke with me during my hospital stay was:  Keatin Benham T Eduard Penkala, RPH ° °Why was Coumadin prescribed for you? °Coumadin was prescribed for you because you have a blood clot or a medical condition that can cause an increased risk of forming blood clots. Blood clots can cause serious health problems by blocking the flow of blood to the heart, lung, or brain. Coumadin can prevent harmful blood clots from forming. °As a reminder your indication for Coumadin is:   Stroke Prevention Because Of Atrial Fibrillation ° °What test will check on my response to Coumadin? °While on Coumadin (warfarin) you will need to have an INR test regularly to ensure that your dose is keeping you in the desired range. The INR (international normalized ratio) number is calculated from the result of the laboratory test called prothrombin time (PT). ° °If an INR APPOINTMENT HAS NOT ALREADY BEEN MADE FOR YOU please schedule an appointment to have this lab work done by your health care provider within 7 days. °Your INR goal is usually a number between:  2 to 3 or your provider may give you a more narrow range like 2-2.5.  Ask your health care provider during an office visit what your goal INR is. ° °What  do you need to  know  About  COUMADIN? °Take Coumadin (warfarin) exactly as prescribed by your healthcare provider about the same time each day.  DO NOT stop taking without talking to the doctor who prescribed the medication.  Stopping without other blood clot prevention medication to take the place of Coumadin may increase your risk of developing a new clot or stroke.  Get refills before you run out. ° °What do you do if you miss a dose? °If you miss a dose, take it as soon as you remember on the same day then continue your regularly scheduled regimen the next  day.  Do not take two doses of Coumadin at the same time. ° °Important Safety Information °A possible side effect of Coumadin (Warfarin) is an increased risk of bleeding. You should call your healthcare provider right away if you experience any of the following: °? Bleeding from an injury or your nose that does not stop. °? Unusual colored urine (red or dark brown) or unusual colored stools (red or black). °? Unusual bruising for unknown reasons. °? A serious fall or if you hit your head (even if there is no bleeding). ° °Some foods or medicines interact with Coumadin® (warfarin) and might alter your response to warfarin. To help avoid this: °? Eat a balanced diet, maintaining a consistent amount of Vitamin K. °? Notify your provider about major diet changes you plan to make. °? Avoid alcohol or limit your intake to 1 drink for women and 2 drinks for men per day. °(1 drink is 5 oz. wine, 12 oz. beer, or 1.5 oz. liquor.) ° °Make sure that ANY health care provider who prescribes medication for you knows that you are taking Coumadin (warfarin).  Also make sure the healthcare provider who is monitoring your Coumadin knows when you have started a new medication including herbals and non-prescription products. ° °Coumadin® (Warfarin)  Major Drug Interactions  °Increased Warfarin Effect Decreased Warfarin Effect  °Alcohol (large quantities) °Antibiotics (esp. Septra/Bactrim, Flagyl, Cipro) °Amiodarone (Cordarone) °Aspirin (ASA) °Cimetidine (Tagamet) °Megestrol (Megace) °NSAIDs (ibuprofen,   naproxen, etc.) °Piroxicam (Feldene) °Propafenone (Rythmol SR) °Propranolol (Inderal) °Isoniazid (INH) °Posaconazole (Noxafil) Barbiturates (Phenobarbital) °Carbamazepine (Tegretol) °Chlordiazepoxide (Librium) °Cholestyramine (Questran) °Griseofulvin °Oral Contraceptives °Rifampin °Sucralfate (Carafate) °Vitamin K  ° °Coumadin® (Warfarin) Major Herbal Interactions  °Increased Warfarin Effect Decreased Warfarin Effect   °Garlic °Ginseng °Ginkgo biloba Coenzyme Q10 °Green tea °St. John’s wort   ° °Coumadin® (Warfarin) FOOD Interactions  °Eat a consistent number of servings per week of foods HIGH in Vitamin K °(1 serving = ½ cup)  °Collards (cooked, or boiled & drained) °Kale (cooked, or boiled & drained) °Mustard greens (cooked, or boiled & drained) °Parsley *serving size only = ¼ cup °Spinach (cooked, or boiled & drained) °Swiss chard (cooked, or boiled & drained) °Turnip greens (cooked, or boiled & drained)  °Eat a consistent number of servings per week of foods MEDIUM-HIGH in Vitamin K °(1 serving = 1 cup)  °Asparagus (cooked, or boiled & drained) °Broccoli (cooked, boiled & drained, or raw & chopped) °Brussel sprouts (cooked, or boiled & drained) *serving size only = ½ cup °Lettuce, raw (green leaf, endive, romaine) °Spinach, raw °Turnip greens, raw & chopped  ° °These websites have more information on Coumadin (warfarin):  www.coumadin.com; °www.ahrq.gov/consumer/coumadin.htm; ° ° ° °

## 2016-06-11 NOTE — Progress Notes (Signed)
Progress Note  Patient Name: Shannon [name] Date of Encounter: 06/11/2016  Primary Cardiologist: Dr. Ena Dawley  Subjective   Up in chair today. No chest pain or palpitations. No lightheadedness.  Inpatient Medications    Scheduled Meds: . atorvastatin  10 mg Per Tube Daily  . cycloSPORINE  1 drop Both Eyes BID  . feeding supplement (NEPRO CARB STEADY)  237 mL Oral BID BM  . feeding supplement (OSMOLITE 1.5 CAL)  1,000 mL Per Tube Q24H  . feeding supplement (PRO-STAT SUGAR FREE 64)  30 mL Per Tube Daily  . gabapentin  300 mg Per Tube QHS  . insulin aspart  0-9 Units Subcutaneous TID WC  . insulin glargine  8 Units Subcutaneous QHS  . latanoprost  1 drop Both Eyes QHS  . levothyroxine  137 mcg Oral QAC breakfast  . mouth rinse  15 mL Mouth Rinse BID  . polyethylene glycol  17 g Oral Daily  . senna  1 tablet Oral Daily  . sertraline  100 mg Per Tube Daily  . warfarin  4 mg Oral ONCE-1800  . Warfarin - Pharmacist Dosing Inpatient   Does not apply q1800    PRN Meds: sodium chloride, acetaminophen, ipratropium-albuterol, ondansetron (ZOFRAN) IV, RESOURCE THICKENUP CLEAR   Vital Signs    Vitals:   06/11/16 0034 06/11/16 0440 06/11/16 0700 06/11/16 0800  BP: (!) 179/78 (!) 170/61  (!) 166/106  Pulse: 70 (!) 59 (!) 58 (!) 58  Resp: 19 15 18 13   Temp: 99 F (37.2 C) 98.6 F (37 C)  98.6 F (37 C)  TempSrc: Oral Oral  Oral  SpO2: 96% 97% 98% 98%  Weight:  134 lb 0.6 oz (60.8 kg)    Height:        Intake/Output Summary (Last 24 hours) at 06/11/16 1020 Last data filed at 06/11/16 1000  Gross per 24 hour  Intake              955 ml  Output             1150 ml  Net             -195 ml   Filed Weights   06/10/16 0416 06/10/16 1833 06/11/16 0440  Weight: 143 lb (64.9 kg) 134 lb 7.7 oz (61 kg) 134 lb 0.6 oz (60.8 kg)    Telemetry    Sinus rhythm, no pauses. Personally reviewed.  ECG    Tracing from 06/08/2016 shows sinus bradycardia with poor R wave  progression, rule out old anterior infarct pattern. Personally reviewed.  Physical Exam   GEN: Elderly woman. No acute distress.   Neck: No JVD. Cardiac: RRR, soft systolic murmur, no gallop.  Respiratory: Nonlabored. Clear to auscultation bilaterally. GI: Soft, nontender, bowel sounds present. MS: No edema; No deformity.  Labs    Chemistry Recent Labs Lab 06/08/16 1523  06/08/16 2359 06/10/16 0317 06/11/16 0503  NA 135  < > 138 134* 137  K 4.4  < > 3.9 3.5 4.3  CL 99*  < > 103 100* 105  CO2 29  --  26 24 25   GLUCOSE 148*  < > 191* 142* 265*  BUN 54*  < > 49* 52* 41*  CREATININE 1.97*  < > 1.60* 1.30* 1.30*  CALCIUM 8.7*  --  9.3 8.7* 8.8*  PROT 5.4*  --   --   --   --   ALBUMIN 2.8*  --   --   --   --  AST 25  --   --   --   --   ALT 24  --   --   --   --   ALKPHOS 53  --   --   --   --   BILITOT 0.6  --   --   --   --   GFRNONAA 21*  --  27* 35* 35*  GFRAA 25*  --  32* 41* 41*  ANIONGAP 7  --  9 10 7   < > = values in this interval not displayed.   Hematology  Recent Labs Lab 06/08/16 2359 06/10/16 0317 06/11/16 0503  WBC 15.4* 10.4 5.8  RBC 4.16 3.33* 3.48*  HGB 11.9* 9.4* 9.9*  HCT 34.7* 27.5* 29.5*  MCV 83.4 82.6 84.8  MCH 28.6 28.2 28.4  MCHC 34.3 34.2 33.6  RDW 21.0* 21.1* 21.9*  PLT 196 144* 158    Cardiac EnzymesNo results for input(s): TROPONINI in the last 168 hours.   Recent Labs Lab 06/08/16 1539  TROPIPOC 0.03     BNP  Recent Labs Lab 06/08/16 1523  BNP 1,268.6*     Radiology    No results found.  Cardiac Studies   Echocardiogram 05/09/2016: Study Conclusions  - Left ventricle: The cavity size was normal. Wall thickness was   normal. Systolic function was normal. The estimated ejection   fraction was in the range of 60% to 65%. Wall motion was normal;   there were no regional wall motion abnormalities. - Aortic valve: There was trivial regurgitation. - Left atrium: The atrium was mildly dilated. Volume/bsa, ES,    (1-plane Simpson&'s, A2C): 45.6 ml/m^2. - Right atrium: The atrium was moderately dilated. - Tricuspid valve: There was moderate regurgitation. - Pulmonary arteries: Systolic pressure was mildly increased. PA   peak pressure: 31 mm Hg (S).  Impressions:  - No cardiac source of emboli was indentified.  Patient Profile     83 y.o. female with hypertension, hyperlipidemia, previous stroke, COPD and asthma, chronic atrial fibrillation, type 2 diabetes mellitus, hypothyroidism, gastric outlet obstruction with previous PEG tube, and diastolic heart failure. She is currently admitted with near-syncope and bradycardia.  Assessment & Plan    1. Symptomatic bradycardia in the setting of tachycardia-bradycardia syndrome with PAF. She has been taken off of diltiazem and bisoprolol. Was temporarily on dopamine and glucagon, drips of been discontinued within the last 48 hours. Heart rate stable at this time.  2. Paroxysmal atrial fibrillation, CHADSVASC score of 6. She is currently on Coumadin. At this point not on any heart rate lowering medications and does have a prior history of tachycardia with arrhythmia. She has declined pacemaker.  3. Chronic diastolic heart failure, stable at this time. LVEF 60-65% by recent assessment.  4. CKD stage 3 with recent acute exacerbation. Creatinine down to 1.3.  5. Essential hypertension, systolic blood pressure 194R to 150s.  Continue Coumadin and statin therapy. She is not on any AV nodal blockers. Hopefully if she has recurrent atrial fibrillation, rate control will not be a major issue since she does not want to proceed with pacemaker and already has documented symptomatic bradycardia on heart rate lowering agents. Pindolol might be an option for her. For further management of hypertension could consider Norvasc or potentially ARB.  Signed, Rozann Lesches, MD  06/11/2016, 10:20 AM

## 2016-06-11 NOTE — Evaluation (Signed)
Physical Therapy Evaluation Patient Details Name: Shannon [name] MRN: [id number] DOB: May 24, 1923 Today's Date: 06/11/2016   History of Present Illness  Patient is a 83 yo female admitted 06/08/16 with near syncope due to symptomatic bradycardia.    PMH:  hypertension, dyslipidemia, CVA, COPD and asthma, chronic atrial fibrillation, diastolic congestive heart failure, tachy/bracy syndrome, CKD, CAD, chronic neck pain, diabetes, diabetic neuropathy, anemia, hypothyroidism, gastric outlet obstruction with prior Gtube.  Clinical Impression  Patient presents with problems listed below.  Will benefit from acute PT to maximize functional independence prior to return home with daughter and family support.  Patient receiving HHPT pta.  Recommend continued HHPT at d/c.    Follow Up Recommendations Home health PT;Supervision for mobility/OOB    Equipment Recommendations  None recommended by PT    Recommendations for Other Services       Precautions / Restrictions Precautions Precautions: Fall Restrictions Weight Bearing Restrictions: No      Mobility  Bed Mobility Overal bed mobility: Needs Assistance Bed Mobility: Supine to Sit;Sit to Supine     Supine to sit: Min assist Sit to supine: Min assist   General bed mobility comments: Assist to raise trunk to sitting position.  Good sitting balance once upright.  Assist to bring LE's onto bed to return to supine.  Transfers Overall transfer level: Needs assistance Equipment used: Rolling walker (2 wheeled) Transfers: Sit to/from Stand Sit to Stand: Min guard         General transfer comment: Verbal cues for hand placement.  Assist for stability during transfers.  Ambulation/Gait Ambulation/Gait assistance: Min guard Ambulation Distance (Feet): 160 Feet Assistive device: Rolling walker (2 wheeled) Gait Pattern/deviations: Step-through pattern;Trunk flexed;Decreased stride length Gait velocity: decreased Gait velocity interpretation:  Below normal speed for age/gender General Gait Details: Patient demonstrates safe use of RW.  Able to ambulate 160' with min guard assist for safety.  HR remained 67-69 during gait.  Stairs            Wheelchair Mobility    Modified Rankin (Stroke Patients Only)       Balance Overall balance assessment: Needs assistance Sitting-balance support: No upper extremity supported;Feet supported Sitting balance-Leahy Scale: Good     Standing balance support: Single extremity supported;During functional activity Standing balance-Leahy Scale: Poor Standing balance comment: UE support to maintain balance                             Pertinent Vitals/Pain Pain Assessment: 0-10 Pain Score: 5  Pain Location: head and neck Pain Descriptors / Indicators: Aching;Headache Pain Intervention(s): Monitored during session;Repositioned (Patient wearing cervical soft collar for neck pain)    Home Living Family/patient expects to be discharged to:: Private residence Living Arrangements: Children (Patient and daughter live together with granddtr with disabi) Available Help at Discharge: Family;Available 24 hours/day Type of Home: House Home Access: Ramped entrance     Home Layout: One level Home Equipment: Walker - 4 wheels;Cane - single point;Hand held Careers information officer - 2 wheels;Bedside commode Additional Comments: Aide comes to assist granddaughter with ADL's    Prior Function Level of Independence: Needs assistance   Gait / Transfers Assistance Needed: Uses rollator   ADL's / Homemaking Assistance Needed: Granddaughter assists with shower. Daughter assists with feeding tube, and does the housekeeping.        Hand Dominance   Dominant Hand: Right    Extremity/Trunk Assessment   Upper Extremity Assessment Upper Extremity Assessment: Generalized weakness  Lower Extremity Assessment Lower Extremity Assessment: Generalized weakness (Peripheral neuropathy with  Bil feet numbness)    Cervical / Trunk Assessment Cervical / Trunk Assessment: Kyphotic (with head forward)  Communication   Communication: No difficulties  Cognition Arousal/Alertness: Awake/alert Behavior During Therapy: WFL for tasks assessed/performed Overall Cognitive Status: Within Functional Limits for tasks assessed                      General Comments      Exercises     Assessment/Plan    PT Assessment Patient needs continued PT services  PT Problem List Decreased strength;Decreased balance;Decreased mobility;Cardiopulmonary status limiting activity;Impaired sensation;Pain       PT Treatment Interventions DME instruction;Gait training;Functional mobility training;Therapeutic activities;Therapeutic exercise;Balance training;Patient/family education    PT Goals (Current goals can be found in the Care Plan section)  Acute Rehab PT Goals Patient Stated Goal: to get better PT Goal Formulation: With patient Time For Goal Achievement: 06/18/16 Potential to Achieve Goals: Good    Frequency Min 3X/week   Barriers to discharge        Co-evaluation               End of Session Equipment Utilized During Treatment: Gait belt Activity Tolerance: Patient tolerated treatment well Patient left: in bed;with call bell/phone within reach Nurse Communication: Mobility status PT Visit Diagnosis: Difficulty in walking, not elsewhere classified (R26.2);Muscle weakness (generalized) (M62.81);Pain Pain - part of body:  (Head and neck)         Time: 1027-2536 PT Time Calculation (min) (ACUTE ONLY): 18 min   Charges:   PT Evaluation $PT Eval High Complexity: 1 Procedure     PT G Codes:         Vena Austria 06-27-2016, 2:35 PM Durenda Hurt. Renaldo Fiddler, [facility] Acute Rehab Services Pager [phone]

## 2016-06-11 NOTE — Progress Notes (Signed)
[place] for Warfarin Indication: atrial fibrillation  Allergies  Allergen Reactions  . Penicillins Itching, Rash and Other (See Comments)    Has patient had a PCN reaction causing immediate rash, facial/tongue/throat swelling, SOB or lightheadedness with hypotension: Yes Has patient had a PCN reaction causing severe rash involving mucus membranes or skin necrosis: Unk Has patient had a PCN reaction that required hospitalization: Unk Has patient had a PCN reaction occurring within the last 10 years: Unk If all of the above answers are "NO", then may proceed with Cephalosporin use.      Vital Signs: Temp: 98.6 F (37 C) (03/11 0440) Temp Source: Oral (03/11 0440) BP: 170/61 (03/11 0440) Pulse Rate: 59 (03/11 0440)  Labs:  Recent Labs  06/08/16 2359 06/09/16 1109 06/10/16 0317 06/11/16 0503  HGB 11.9*  --  9.4* 9.9*  HCT 34.7*  --  27.5* 29.5*  PLT 196  --  144* 158  LABPROT  --  18.8* 20.7* 22.3*  INR  --  1.56 1.75 1.92  CREATININE 1.60*  --  1.30* 1.30*    Estimated Creatinine Clearance: 26.9 mL/min (by C-G formula based on SCr of 1.3 mg/dL (H)).   Medical History: Past Medical History:  Diagnosis Date  . Anemia    previous blood transfusions  . Arthritis    "all over"  . Asthma   . Bradycardia    requiring previous d/c of BB and reduction of amiodarone  . CAD (coronary artery disease)    nonobstructive per notes  . Chronic diastolic CHF (congestive heart failure) ([place])   . CKD (chronic kidney disease), stage III   . Complication of blood transfusion    "got the wrong blood type at [facility] in ~ 2015; no adverse reaction that we are aware of"/daughter, [name] (01/27/2016)  . COPD (chronic obstructive pulmonary disease) ([place])   . Depression    "light case"  . DVT (deep venous thrombosis) ([place]) 01/2016   a. LLE DVT 01/2016 - switched from Eliquis to Coumadin.  . Gastric stenosis    a. s/p stomach tube  . GERD  (gastroesophageal reflux disease)   . History of blood transfusion    "several" (01/27/2016)  . History of stomach ulcers   . Hyperlipidemia   . Hypertension   . Hypothyroidism   . Paraesophageal hernia   . Perforated gastric ulcer ([place])   . Seasonal allergies   . SIADH (syndrome of inappropriate ADH production) ([place])    [name] 01/10/2015  . Small bowel obstruction    "I don't know how many" (01/11/2015)  . Stroke ([place])    "light one"  . Type II diabetes mellitus ([place])    "related to prednisone use  for > 20 yr; once predinose stopped; no more DM RX" (01/27/2016)  . Ventral hernia with bowel obstruction     Medications:  Prescriptions Prior to Admission  Medication Sig Dispense Refill Last Dose  . acetaminophen (TYLENOL) 325 MG tablet Take 650 mg by mouth 2 (two) times daily as needed for mild pain.    Past Month at Unknown time  . AMINO ACIDS-PROTEIN HYDROLYS PO Take 30 mLs by mouth 2 (two) times daily.   06/08/2016 at Unknown time  . atorvastatin (LIPITOR) 10 MG tablet TAKE ONE TABLET BY MOUTH ONCE DAILY 90 tablet 1 06/07/2016 at Unknown time  . b complex vitamins tablet Take 1 tablet by mouth daily.    06/08/2016 at Unknown time  . bacitracin-polymyxin b (POLYSPORIN) ophthalmic  ointment Place 1 application into both eyes 2 (two) times daily. apply to eye every 12 hours while awake   06/08/2016 at Unknown time  . bisacodyl (DULCOLAX) 10 MG suppository Place 1 suppository (10 mg total) rectally daily as needed for moderate constipation. 12 suppository 0 Past Month at Unknown time  . bisoprolol (ZEBETA) 5 MG tablet Take 2 tablets (10 mg total) by mouth daily. (Patient taking differently: Take 5 mg by mouth 2 (two) times daily. ) 30 tablet 0 06/08/2016 at 1000  . calcium-vitamin D (OSCAL WITH D) 500-200 MG-UNIT per tablet Take 1 tablet by mouth daily with breakfast.    06/08/2016 at Unknown time  . chlorhexidine (PERIDEX) 0.12 % solution Use as directed 15 mLs in the mouth or throat 2 (two) times  daily.   06/08/2016 at Unknown time  . Chlorphen-PE-Acetaminophen (SINUS CONGESTION/PAIN DAY/NGHT PO) Take 1 capsule by mouth daily.   06/08/2016 at Unknown time  . diltiazem (CARDIZEM CD) 120 MG 24 hr capsule Take 1 capsule (120 mg total) by mouth daily. 90 capsule 3 06/08/2016 at Unknown time  . feeding supplement, GLUCERNA SHAKE, (GLUCERNA SHAKE) LIQD Take 237 mLs by mouth daily.    06/07/2016 at Unknown time  . fluticasone (FLONASE) 50 MCG/ACT nasal spray Place 2 sprays into both nostrils daily. 16 g 3 06/08/2016 at Unknown time  . furosemide (LASIX) 40 MG tablet Take 1 tablet (40 mg total) by mouth daily. 30 tablet 0 06/08/2016 at Unknown time  . gabapentin (NEURONTIN) 300 MG capsule Take 1 capsule (300 mg total) by mouth at bedtime. 30 capsule 0 06/07/2016 at Unknown time  . hydrALAZINE (APRESOLINE) 50 MG tablet Take 1 tablet (50 mg total) by mouth 3 (three) times daily. 120 tablet 0 06/08/2016 at Unknown time  . insulin aspart (NOVOLOG FLEXPEN) 100 UNIT/ML FlexPen Insulin sliding scale: Blood sugar  120-150   3units                       151-200   4units                       201-250   7units                       251- 300  11units                       301-350   15uints                       351-400   20units                       >400         call MD immediately 1 pen 1 06/07/2016 at Unknown time  . Insulin Glargine (LANTUS SOLOSTAR) 100 UNIT/ML Solostar Pen Inject 5 Units into the skin daily at 10 pm. 5 pen 6 06/07/2016 at Unknown time  . ipratropium-albuterol (DUONEB) 0.5-2.5 (3) MG/3ML SOLN Take 3 mLs by nebulization every 6 (six) hours as needed (sob). Use 3 times daily x 4 days then every 6 hours as needed. (Patient taking differently: Take 3 mLs by nebulization every 6 (six) hours as needed (sob). ) 360 mL 4 06/07/2016 at Unknown time  . isosorbide mononitrate (IMDUR) 30 MG 24 hr tablet TAKE 1 TABLET (30 MG TOTAL) BY MOUTH DAILY. 90 tablet 3 06/08/2016  at Unknown time  . latanoprost (XALATAN) 0.005 %  ophthalmic solution Place 1 drop into both eyes at bedtime. 2.5 mL 12 06/07/2016 at Unknown time  . levalbuterol (XOPENEX HFA) 45 MCG/ACT inhaler Inhale 2 puffs into the lungs every 6 (six) hours as needed for wheezing or shortness of breath. 1 Inhaler 12 06/08/2016 at Unknown time  . levothyroxine (SYNTHROID, LEVOTHROID) 137 MCG tablet TAKE 1 TABLET (137 MCG TOTAL) BY MOUTH DAILY BEFORE BREAKFAST. 30 tablet 2 06/08/2016 at Unknown time  . Maltodextrin-Xanthan Gum (RESOURCE THICKENUP CLEAR) POWD Take 120 g by mouth as needed (use to thicken liquids.). 20 Can 0 prn  . Multiple Vitamin (MULTIVITAMIN WITH MINERALS) TABS tablet Take 1 tablet by mouth daily.   06/08/2016 at Unknown time  . Nutritional Supplements (FEEDING SUPPLEMENT, OSMOLITE 1.5 CAL,) LIQD Place 1,000 mLs into feeding tube daily. (Patient taking differently: Place 65 mL/hr into feeding tube daily. ) 1000 mL 3 06/08/2016 at Unknown time  . Polyethyl Glycol-Propyl Glycol 0.4-0.3 % SOLN Place 1 drop into both eyes 4 (four) times daily.    06/08/2016 at Unknown time  . RESTASIS 0.05 % ophthalmic emulsion USE 1 DROP INTO BOTH EYES TWICE DAILY 60 mL 0 06/08/2016 at Unknown time  . sertraline (ZOLOFT) 100 MG tablet TAKE ONE TABLET BY MOUTH ONCE DAILY FOR DEPRESSION 90 tablet 1 06/08/2016 at Unknown time  . Water For Irrigation, Sterile (FREE WATER) SOLN Place 230 mLs into feeding tube 3 (three) times daily.   06/08/2016 at Unknown time  . warfarin (COUMADIN) 2.5 MG tablet Take as directed by Coumadin Clinic. (Patient taking differently: Take 2.5 mg by mouth daily. ) 30 tablet 1 on hold   Scheduled:  . atorvastatin  10 mg Per Tube Daily  . cycloSPORINE  1 drop Both Eyes BID  . feeding supplement (NEPRO CARB STEADY)  237 mL Oral BID BM  . feeding supplement (OSMOLITE 1.5 CAL)  1,000 mL Per Tube Q24H  . feeding supplement (PRO-STAT SUGAR FREE 64)  30 mL Per Tube Daily  . gabapentin  300 mg Per Tube QHS  . insulin aspart  0-9 Units Subcutaneous TID WC  . insulin  glargine  5 Units Subcutaneous QHS  . latanoprost  1 drop Both Eyes QHS  . levothyroxine  137 mcg Oral QAC breakfast  . mouth rinse  15 mL Mouth Rinse BID  . sertraline  100 mg Per Tube Daily  . Warfarin - Pharmacist Dosing Inpatient   Does not apply q1800   Infusions:   PRN: sodium chloride, acetaminophen, ipratropium-albuterol, ondansetron (ZOFRAN) IV, RESOURCE THICKENUP CLEAR Anti-infectives    None      Assessment: 89 yoF on warfarin PTA for AFib. Warfarin on hold PTA since 3/5 due to recent supratherapeutic INR with plans to resume 3/8. INR on admission was subtherapeutic at 1.78, likely due to recently held doses - to resume warfarin inpatient per pharmacy. INR today remains slightly subtherapeutic at 1.92 but trending up nicely from 1.75 on 3/10 despite missed dose on 3/9. Hgb and platelets stable, no S/Sx bleeding noted. Will give slightly boosted dose again tonight.  PTA Warfarin Dose: 2.5mg  Mon, 3.75mg  all other days of the week  Goal of Therapy:  INR 2-3 Monitor platelets by anticoagulation protocol: Yes   Plan:  -Warfarin 4mg  PO x1 tonight  -Daily INR -Monitor for signs/symptoms of bleeding  Arrie Senate, PharmD PGY-1 Pharmacy Resident Pager: [phone] 06/11/2016

## 2016-06-12 LAB — CBC
HCT: 29.9 % — ABNORMAL LOW (ref 36.0–46.0)
Hemoglobin: 10 g/dL — ABNORMAL LOW (ref 12.0–15.0)
MCH: 28.3 pg (ref 26.0–34.0)
MCHC: 33.4 g/dL (ref 30.0–36.0)
MCV: 84.7 fL (ref 78.0–100.0)
Platelets: 153 10*3/uL (ref 150–400)
RBC: 3.53 MIL/uL — ABNORMAL LOW (ref 3.87–5.11)
RDW: 22 % — ABNORMAL HIGH (ref 11.5–15.5)
WBC: 5.5 10*3/uL (ref 4.0–10.5)

## 2016-06-12 LAB — GLUCOSE, CAPILLARY
Glucose-Capillary: 111 mg/dL — ABNORMAL HIGH (ref 65–99)
Glucose-Capillary: 135 mg/dL — ABNORMAL HIGH (ref 65–99)
Glucose-Capillary: 149 mg/dL — ABNORMAL HIGH (ref 65–99)
Glucose-Capillary: 159 mg/dL — ABNORMAL HIGH (ref 65–99)
Glucose-Capillary: 192 mg/dL — ABNORMAL HIGH (ref 65–99)
Glucose-Capillary: 196 mg/dL — ABNORMAL HIGH (ref 65–99)
Glucose-Capillary: 97 mg/dL (ref 65–99)

## 2016-06-12 LAB — BASIC METABOLIC PANEL
Anion gap: 7 (ref 5–15)
BUN: 35 mg/dL — ABNORMAL HIGH (ref 6–20)
CO2: 24 mmol/L (ref 22–32)
Calcium: 8.7 mg/dL — ABNORMAL LOW (ref 8.9–10.3)
Chloride: 106 mmol/L (ref 101–111)
Creatinine, Ser: 1.26 mg/dL — ABNORMAL HIGH (ref 0.44–1.00)
GFR calc Af Amer: 42 mL/min — ABNORMAL LOW (ref >60)
GFR calc non Af Amer: 36 mL/min — ABNORMAL LOW (ref >60)
Glucose, Bld: 183 mg/dL — ABNORMAL HIGH (ref 65–99)
Potassium: 4.3 mmol/L (ref 3.5–5.1)
Sodium: 137 mmol/L (ref 135–145)

## 2016-06-12 LAB — PROTIME-INR
INR: 2.61
Prothrombin Time: 28.4 seconds — ABNORMAL HIGH (ref 11.4–15.2)

## 2016-06-12 MED ORDER — ATORVASTATIN CALCIUM 10 MG PO TABS
10.0000 mg | ORAL_TABLET | Freq: Every day | ORAL | Status: DC
  Administered 2016-06-13 – 2016-06-14 (×2): 10 mg via ORAL
  Filled 2016-06-12 (×3): qty 1

## 2016-06-12 MED ORDER — SERTRALINE HCL 100 MG PO TABS
100.0000 mg | ORAL_TABLET | Freq: Every day | ORAL | Status: DC
  Administered 2016-06-13 – 2016-06-14 (×2): 100 mg via ORAL
  Filled 2016-06-12 (×2): qty 1

## 2016-06-12 MED ORDER — GABAPENTIN 300 MG PO CAPS
300.0000 mg | ORAL_CAPSULE | Freq: Every day | ORAL | Status: DC
  Administered 2016-06-12: 300 mg via ORAL
  Filled 2016-06-12 (×2): qty 1

## 2016-06-12 MED ORDER — HYPROMELLOSE (GONIOSCOPIC) 2.5 % OP SOLN
1.0000 [drp] | Freq: Three times a day (TID) | OPHTHALMIC | Status: DC | PRN
  Administered 2016-06-15: 1 [drp] via OPHTHALMIC
  Filled 2016-06-12: qty 15

## 2016-06-12 MED ORDER — LOSARTAN POTASSIUM 25 MG PO TABS
25.0000 mg | ORAL_TABLET | Freq: Every day | ORAL | Status: DC
  Administered 2016-06-12: 25 mg via ORAL
  Filled 2016-06-12 (×2): qty 1

## 2016-06-12 MED ORDER — AMLODIPINE BESYLATE 10 MG PO TABS
10.0000 mg | ORAL_TABLET | Freq: Every day | ORAL | Status: DC
  Administered 2016-06-12 – 2016-06-14 (×3): 10 mg via ORAL
  Filled 2016-06-12 (×3): qty 1

## 2016-06-12 NOTE — Progress Notes (Signed)
Inpatient Diabetes Program Recommendations  AACE/ADA: New Consensus Statement on Inpatient Glycemic Control (2015)  Target Ranges:  Prepandial:   less than 140 mg/dL      Peak postprandial:   less than 180 mg/dL (1-2 hours)      Critically ill patients:  140 - 180 mg/dL   Lab Results  Component Value Date   GLUCAP 196 (H) 06/12/2016   HGBA1C 8.0 (H) 05/30/2016    Review of Glycemic Control Results for Shannon [name], Shannon [name] (MRN [id number]) as of 06/12/2016 10:32  Ref. Range 06/11/2016 04:42 06/11/2016 09:01 06/11/2016 11:51 06/11/2016 16:39 06/11/2016 19:25 06/11/2016 23:27 06/12/2016 03:14 06/12/2016 07:26  Glucose-Capillary Latest Ref Range: 65 - 99 mg/dL 258 (H) 178 (H) 54 (L) 68 104 (H) 159 (H) 192 (H) 196 (H)    Inpatient Diabetes Program Recommendations:  Please consider: -Decrease Novolog correction to custom scale 150-200    1 units 201-250     2 units 251-300     3 units 301-350      4 units 351-400       5 units >400            Call MD  Thank you, Nani Gasser. Eirik Schueler, RN, MSN, CDE Inpatient Glycemic Control Team Team Pager [phone] (8am-5pm) 06/12/2016 10:34 AM

## 2016-06-12 NOTE — Progress Notes (Signed)
Patient transferred to 3e30 with NT by wheelchair.

## 2016-06-12 NOTE — Care Management Note (Signed)
Case Management Note  Patient Details  Name: Shannon [name] MRN: [id number] Date of Birth: 11-Sep-1923  Subjective/Objective:   From home with daughter, she is active ([place]) with [facility], PT, OT, aide, SW.  Presents with bradycardia and hypotensive, was on dopamine drip but off now, has hx of tachy brady syndrome but refuses pace maker, she has a long term peg tube, she gets feeds at night from 8pm to 8 am, BP is high trying to get it under control. She is for possible dc tomorrow.  NCM will cont to follow for dc needs.                   Action/Plan:   Expected Discharge Date:                  Expected Discharge Plan:  [place]  In-House Referral:     Discharge planning Services  CM Consult  Post Acute Care Choice:  Home Health, Resumption of Svcs/PTA Provider Choice offered to:  Patient  DME Arranged:    DME Agency:     HH Arranged:  RN, PT, OT, Nurse's Aide, Social Work [employer] Agency:  [place]  Status of Service:  Completed, signed off  If discussed at [employer], dates discussed:    Additional Comments:  Zenon Mayo, RN 06/12/2016, 12:31 PM

## 2016-06-12 NOTE — Progress Notes (Signed)
[place] for Warfarin Indication: atrial fibrillation  Vital Signs: Temp: 98.5 F (36.9 C) (03/12 0725) Temp Source: Oral (03/12 0725) BP: 170/84 (03/12 0725) Pulse Rate: 60 (03/12 0725)  Assessment: 89 yoF on Coumadin 3.75mg  daily exc for 2.5mg  on Mon PTA for Afib. INR 1.78 on admit. Recently finished a course of doxy and Levaquin. INR trending up to 2.61 even after missed dose on 3/9. Hgb low but stable at 10, plts wnl. No s/s of bleed.   Goal of Therapy:  INR 2-3 Monitor platelets by anticoagulation protocol: Yes   Plan:  Hold Coumadin tonight (expect INR to continue to rise) Monitor daily INR, CBC, s/s of bleed  [name], PharmD, BCPS Clinical Pharmacist Pager [phone] 06/12/2016 8:30 AM

## 2016-06-12 NOTE — Progress Notes (Signed)
[facility] PROGRESS NOTE  Shannon [name] WGN:[id number] DOB: 1924/01/21 DOA: 06/08/2016  PCP: Kirt Boys, DO  Brief History/Interval Summary: 83 year old female with PMH of asthma, bradycardia, CAD, Diastolic HF, CKD stage III, COPD, DVT, GERD, HTN, DM type 2, gastric outlet obstruction s/p G-tube placement, and hyperlipidemia. Presented to ED 3/8 with complaints of syncope and weakness. Upon arrival EMS reported HR 30-40s but mentating well. Cardiology was consulted. Recently patient was started on Cardizem and Bisoprolol. Daughter reported that patient has been taking her medications as prescribed since last admission. In ED she was on EPI gtt and receiving dose of calcium gluconate. Patient was admitted to the intensive care unit after being initiated on dopamine infusion as well as glucagon infusion. She was stabilized and then transferred to stepdown unit.  Reason for Visit: Bradycardia. Hypertension  Consultants: Cardiology. Critical care medicine.  Procedures: None  Antibiotics: None  Subjective/Interval History: Patient complains neck pain and headache, which is a chronic issue for her. Complains of some dizziness at times, which is also chronic issue for her. Otherwise, denies any chest pain or shortness of breath.   ROS: Denies any nausea or vomiting.  Objective:  Vital Signs  Vitals:   06/11/16 1952 06/11/16 2326 06/12/16 0312 06/12/16 0725  BP: (!) 161/65 (!) 172/64 (!) 181/74 (!) 170/84  Pulse: (!) 56 (!) 59 62 60  Resp: 18 (!) 21 (!) 22 19  Temp: 99.1 F (37.3 C) 98.9 F (37.2 C) 98.2 F (36.8 C) 98.5 F (36.9 C)  TempSrc: Oral Oral Oral Oral  SpO2: 98% 99% 99% 99%  Weight:      Height:        Intake/Output Summary (Last 24 hours) at 06/12/16 1022 Last data filed at 06/12/16 0600  Gross per 24 hour  Intake             2160 ml  Output              702 ml  Net             1458 ml   Filed Weights   06/10/16 0416 06/10/16 1833 06/11/16 0440    Weight: 64.9 kg (143 lb) 61 kg (134 lb 7.7 oz) 60.8 kg (134 lb 0.6 oz)    General appearance: alert, cooperative, appears stated age and no distress Resp: clear to auscultation bilaterally Cardio: S1, S2, is bradycardic, regular. No S3, S4. No rubs, murmurs, or bruit.  GI: soft, non-tender; bowel sounds normal; no masses,  no organomegaly Extremities: No edema Neurologic: Awake and alert. Oriented 3. No focal neurological deficits.  Lab Results:  Data Reviewed: I have personally reviewed following labs and imaging studies  CBC:  Recent Labs Lab 06/08/16 1523 06/08/16 1541 06/08/16 2359 06/10/16 0317 06/11/16 0503 06/12/16 0317  WBC 6.0  --  15.4* 10.4 5.8 5.5  NEUTROABS 3.5  --   --   --   --   --   HGB 9.6* 10.5* 11.9* 9.4* 9.9* 10.0*  HCT 28.5* 31.0* 34.7* 27.5* 29.5* 29.9*  MCV 83.8  --  83.4 82.6 84.8 84.7  PLT 176  --  196 144* 158 153    Basic Metabolic Panel:  Recent Labs Lab 06/08/16 1523 06/08/16 1541 06/08/16 2359 06/10/16 0317 06/11/16 0503 06/12/16 0317  NA 135 135 138 134* 137 137  K 4.4 4.5 3.9 3.5 4.3 4.3  CL 99* 98* 103 100* 105 106  CO2 29  --  26 24 25  24  GLUCOSE 148* 144* 191* 142* 265* 183*  BUN 54* 53* 49* 52* 41* 35*  CREATININE 1.97* 2.00* 1.60* 1.30* 1.30* 1.26*  CALCIUM 8.7*  --  9.3 8.7* 8.8* 8.7*  MG 1.9  --  1.9 2.1  --   --   PHOS  --   --  3.3 3.3  --   --     GFR: Estimated Creatinine Clearance: 27.8 mL/min (by C-G formula based on SCr of 1.26 mg/dL (H)).  Liver Function Tests:  Recent Labs Lab 06/08/16 1523  AST 25  ALT 24  ALKPHOS 53  BILITOT 0.6  PROT 5.4*  ALBUMIN 2.8*    Coagulation Profile:  Recent Labs Lab 06/08/16 1523 06/09/16 1109 06/10/16 0317 06/11/16 0503 06/12/16 0317  INR 1.78 1.56 1.75 1.92 2.61     CBG:  Recent Labs Lab 06/11/16 1639 06/11/16 1925 06/11/16 2327 06/12/16 0314 06/12/16 0726  GLUCAP 68 104* 159* 192* 196*     Recent Results (from the past 240 hour(s))   MRSA PCR Screening     Status: None   Collection Time: 06/08/16  9:27 PM  Result Value Ref Range Status   MRSA by PCR NEGATIVE NEGATIVE Final    Comment:        The GeneXpert MRSA Assay (FDA approved for NASAL specimens only), is one component of a comprehensive MRSA colonization surveillance program. It is not intended to diagnose MRSA infection nor to guide or monitor treatment for MRSA infections.       Radiology Studies: No results found.   Medications:  Scheduled: . amLODipine  10 mg Oral Daily  . atorvastatin  10 mg Per Tube Daily  . cycloSPORINE  1 drop Both Eyes BID  . feeding supplement (NEPRO CARB STEADY)  237 mL Oral BID BM  . feeding supplement (OSMOLITE 1.5 CAL)  1,000 mL Per Tube Q24H  . feeding supplement (PRO-STAT SUGAR FREE 64)  30 mL Per Tube Daily  . gabapentin  300 mg Per Tube QHS  . insulin aspart  0-9 Units Subcutaneous TID WC  . insulin glargine  8 Units Subcutaneous QHS  . latanoprost  1 drop Both Eyes QHS  . levothyroxine  137 mcg Oral QAC breakfast  . mouth rinse  15 mL Mouth Rinse BID  . polyethylene glycol  17 g Oral Daily  . senna  1 tablet Oral Daily  . sertraline  100 mg Per Tube Daily  . Warfarin - Pharmacist Dosing Inpatient   Does not apply q1800   Continuous:  ZOX:WRUEAV chloride, acetaminophen, hydroxypropyl methylcellulose / hypromellose, ipratropium-albuterol, ondansetron (ZOFRAN) IV, RESOURCE THICKENUP CLEAR  Assessment/Plan:  Principal Problem:   Symptomatic bradycardia Active Problems:   Diabetes mellitus type 2 in nonobese (HCC)   Hypothyroidism   Chronic kidney insufficiency, stage 3 (moderate)   PEG (percutaneous endoscopic gastrostomy) status (HCC)   Calcium channel blocker adverse reaction   Elevated TSH    Symptomatic bradycardia in the setting of tachycardia bradycardia syndrome Patient was recently started on Cardizem and bisoprolol. She came in with symptomatic bradycardia. Patient seen by cardiology. She  was placed on dopamine as well as glucagon infusions. Heart rate has stabilized. She has been taken off of these infusions. Patient has refused pacemaker placement. Cardiology continues to follow. Avoid rate limiting drugs in the future. Patient remains stable.  Paroxysmal atrial fibrillation This appears to be stable. Chads2 vascular score of 6. Continue with anticoagulation with warfarin. Pharmacy is managing.  Diabetes mellitus type 2 Patient did have  hyperglycemia as a result of glucagon. She was started on Lantus. She was also started on her tube feedings last night. Lantus dose was adjusted. CBGs have improved some. Did have another episode of hypoglycemia yesterday afternoon. Continue to monitor. Continue with sliding scale coverage. HbA1c back in February was 8.0.  Chronic diastolic congestive heart failure Systolic function has been noted to be normal. Currently appears to be well compensated.  Acute on Chronic kidney disease stage III Creatinine was 2 when she was admitted to the hospital. She is making urine. Creatinine has improved. Continue to monitor closely.  Essential hypertension Blood pressure remains poorly controlled. We will increase the dose of amlodipine. Will also add low-dose ARB per cardiology recommendation. Avoid AV nodal blocking agents.  History of COPD and asthma Stable.  History of gastric outlet obstruction, status post PEG tube She is on tube feedings during the nighttime. Is on full liquid diet during the daytime with nectar thick liquids.  Hypothyroidism Noted to have elevated TSH and mildly elevated free T4. Will not make any changes to her thyroid medication dose at this time. Continue current dose of levothyroxine. Will recommend repeating her thyroid function tests as an outpatient in 3-4 weeks.  Normocytic Anemia Hemoglobin levels noted to be fluctuating. She does appear to have chronic anemia. No overt bleeding. Continue to monitor.  History of  stroke Stable. PT evaluation.  History of depression Continue sertraline.  Constipation Initiate laxatives.  DVT Prophylaxis: On warfarin    Code Status: DO NOT RESUSCITATE  Family Communication: Discussed with the patient  Disposition Plan: Continue management as outlined above. Okay for transfer to telemetry. Achieve better blood pressure control. Anticipate discharge tomorrow with home health.    LOS: 4 days   [facility]  [facility] Pager [phone] 06/12/2016, 10:21 AM  If 7PM-7AM, please contact night-coverage at [email or web address], password [facility]

## 2016-06-12 NOTE — Progress Notes (Signed)
Progress Note  Patient Name: Shannon [name] Date of Encounter: 06/12/2016  Primary Cardiologist: Dr. Ena Dawley  Subjective   Patient is very verbal and conversant. She denies chest discomfort, shortness of breath or palpitations. She has been up in the chair yesterday and walked a short distance without dizziness. She feels much better than shen she arrive, but not quite her normal.  Inpatient Medications    Scheduled Meds: . amLODipine  10 mg Oral Daily  . atorvastatin  10 mg Per Tube Daily  . cycloSPORINE  1 drop Both Eyes BID  . feeding supplement (NEPRO CARB STEADY)  237 mL Oral BID BM  . feeding supplement (OSMOLITE 1.5 CAL)  1,000 mL Per Tube Q24H  . feeding supplement (PRO-STAT SUGAR FREE 64)  30 mL Per Tube Daily  . gabapentin  300 mg Per Tube QHS  . insulin aspart  0-9 Units Subcutaneous TID WC  . insulin glargine  8 Units Subcutaneous QHS  . latanoprost  1 drop Both Eyes QHS  . levothyroxine  137 mcg Oral QAC breakfast  . mouth rinse  15 mL Mouth Rinse BID  . polyethylene glycol  17 g Oral Daily  . senna  1 tablet Oral Daily  . sertraline  100 mg Per Tube Daily  . Warfarin - Pharmacist Dosing Inpatient   Does not apply q1800   Continuous Infusions:  PRN Meds: sodium chloride, acetaminophen, ipratropium-albuterol, ondansetron (ZOFRAN) IV, RESOURCE THICKENUP CLEAR   Vital Signs    Vitals:   06/11/16 1952 06/11/16 2326 06/12/16 0312 06/12/16 0725  BP: (!) 161/65 (!) 172/64 (!) 181/74 (!) 170/84  Pulse: (!) 56 (!) 59 62 60  Resp: 18 (!) 21 (!) 22 19  Temp: 99.1 F (37.3 C) 98.9 F (37.2 C) 98.2 F (36.8 C) 98.5 F (36.9 C)  TempSrc: Oral Oral Oral Oral  SpO2: 98% 99% 99% 99%  Weight:      Height:        Intake/Output Summary (Last 24 hours) at 06/12/16 0829 Last data filed at 06/12/16 0600  Gross per 24 hour  Intake             2637 ml  Output             1402 ml  Net             1235 ml   Filed Weights   06/10/16 0416 06/10/16 1833 06/11/16  0440  Weight: 143 lb (64.9 kg) 134 lb 7.7 oz (61 kg) 134 lb 0.6 oz (60.8 kg)    Telemetry    sinus bradycardia in the 50's - Personally Reviewed  ECG    No new tracings.   Physical Exam   GEN: Elderly AA female. No acute distress. Wearing a neck collar.  Neck: No JVD Cardiac: RRR, SEM murmurs, rubs, or gallops.  Respiratory: Clear to auscultation bilaterally. GI: Soft, nontender, non-distended  MS: No edema; No deformity. Neuro:  Nonfocal  Psych: Normal affect   Labs    Chemistry Recent Labs Lab 06/08/16 1523  06/10/16 0317 06/11/16 0503 06/12/16 0317  NA 135  < > 134* 137 137  K 4.4  < > 3.5 4.3 4.3  CL 99*  < > 100* 105 106  CO2 29  < > 24 25 24   GLUCOSE 148*  < > 142* 265* 183*  BUN 54*  < > 52* 41* 35*  CREATININE 1.97*  < > 1.30* 1.30* 1.26*  CALCIUM 8.7*  < > 8.7*  8.8* 8.7*  PROT 5.4*  --   --   --   --   ALBUMIN 2.8*  --   --   --   --   AST 25  --   --   --   --   ALT 24  --   --   --   --   ALKPHOS 53  --   --   --   --   BILITOT 0.6  --   --   --   --   GFRNONAA 21*  < > 35* 35* 36*  GFRAA 25*  < > 41* 41* 42*  ANIONGAP 7  < > 10 7 7   < > = values in this interval not displayed.   Hematology Recent Labs Lab 06/10/16 0317 06/11/16 0503 06/12/16 0317  WBC 10.4 5.8 5.5  RBC 3.33* 3.48* 3.53*  HGB 9.4* 9.9* 10.0*  HCT 27.5* 29.5* 29.9*  MCV 82.6 84.8 84.7  MCH 28.2 28.4 28.3  MCHC 34.2 33.6 33.4  RDW 21.1* 21.9* 22.0*  PLT 144* 158 153    Cardiac EnzymesNo results for input(s): TROPONINI in the last 168 hours.  Recent Labs Lab 06/08/16 1539  TROPIPOC 0.03     BNP Recent Labs Lab 06/08/16 1523  BNP 1,268.6*     DDimer No results for input(s): DDIMER in the last 168 hours.   Radiology    No results found.  Cardiac Studies   Echocardiogram 05/09/2016: Study Conclusions  - Left ventricle: The cavity size was normal. Wall thickness was normal. Systolic function was normal. The estimated ejection fraction was in the  range of 60% to 65%. Wall motion was normal; there were no regional wall motion abnormalities. - Aortic valve: There was trivial regurgitation. - Left atrium: The atrium was mildly dilated. Volume/bsa, ES, (1-plane Simpson&'s, A2C): 45.6 ml/m^2. - Right atrium: The atrium was moderately dilated. - Tricuspid valve: There was moderate regurgitation. - Pulmonary arteries: Systolic pressure was mildly increased. PA peak pressure: 31 mm Hg (S).  Impressions:  - No cardiac source of emboli was indentified.  Patient Profile     83 y.o. female with hypertension, hyperlipidemia, previous stroke, COPD and asthma, chronic atrial fibrillation, type 2 diabetes mellitus, hypothyroidism, gastric outlet obstruction with previous PEG tube, and diastolic heart failure. She is currently admitted with near-syncope and bradycardia.   Assessment & Plan    1. Symptomatic bradycardia -Hx of tachy-brday syndrome with PAF, admitted with heart rates in the 30's-40's. -She had been on Cardizem and bisoprolol. Amiodarone was dc'd in 05/2016 for suspected lung toxicity. -She was treated with dopamine and glucagon drips initially until stabilized. All AV nodal blocking agents are being held. -She is maintaining sinus bradycardia in the 50's  2. Paroxysmal atrial fibrillation -CHA2DS2/VAS Stroke Risk  Is 9 (CHF, CAD, HTN, Age (2), DM, stroke (2), female). She is anticoagulated with coumadin as she has CKD. INR is therapeutic today. Managed by pharmacy. -Not on any heart rate control due to symptomatic bradycardia on cardizem and bisoprolol. Currently sinus bradycardia in the 50's. -has declined a pacemaker  3. Chronic diastolic heart failure -BNP 1268 on admission -LV EF 60-65% -stable at this time.   4. CKD stage 3 -SCr 2.00 >>>1.26, stable  5. Hypertension - SBP 160's-180's -Norvasc 5 mg inititated on 3/11, increased to 10 mg today -Avoiding any AV nodal blocking agents -With improved SCr, may  consider adding an ARB if further BP control needed  Signed, Daune Perch, NP  06/12/2016, 8:29 AM    Patient examined chart reviewed. Elderly female with SEM clear lungs. Tachybrady. Telemetry today with no AV block SB.  Norvasc titrated for BP. Declined pacer Rhythm stable at present. Continue coumadin for PAF   [employer]

## 2016-06-13 ENCOUNTER — Encounter (HOSPITAL_COMMUNITY): Payer: Self-pay | Admitting: Rehabilitation

## 2016-06-13 ENCOUNTER — Inpatient Hospital Stay (HOSPITAL_COMMUNITY): Payer: Medicare Other

## 2016-06-13 LAB — BASIC METABOLIC PANEL
Anion gap: 10 (ref 5–15)
BUN: 32 mg/dL — ABNORMAL HIGH (ref 6–20)
CO2: 22 mmol/L (ref 22–32)
Calcium: 8.8 mg/dL — ABNORMAL LOW (ref 8.9–10.3)
Chloride: 106 mmol/L (ref 101–111)
Creatinine, Ser: 1.15 mg/dL — ABNORMAL HIGH (ref 0.44–1.00)
GFR calc Af Amer: 47 mL/min — ABNORMAL LOW (ref >60)
GFR calc non Af Amer: 41 mL/min — ABNORMAL LOW (ref >60)
Glucose, Bld: 166 mg/dL — ABNORMAL HIGH (ref 65–99)
Potassium: 4.3 mmol/L (ref 3.5–5.1)
Sodium: 138 mmol/L (ref 135–145)

## 2016-06-13 LAB — GLUCOSE, CAPILLARY
Glucose-Capillary: 163 mg/dL — ABNORMAL HIGH (ref 65–99)
Glucose-Capillary: 168 mg/dL — ABNORMAL HIGH (ref 65–99)
Glucose-Capillary: 133 mg/dL — ABNORMAL HIGH (ref 65–99)
Glucose-Capillary: 92 mg/dL (ref 65–99)
Glucose-Capillary: 82 mg/dL (ref 65–99)

## 2016-06-13 LAB — CBC WITH DIFFERENTIAL/PLATELET
Basophils Absolute: 0 10*3/uL (ref 0.0–0.1)
Basophils Relative: 0 %
Eosinophils Absolute: 0 10*3/uL (ref 0.0–0.7)
Eosinophils Relative: 0 %
HCT: 32.1 % — ABNORMAL LOW (ref 36.0–46.0)
Hemoglobin: 10.8 g/dL — ABNORMAL LOW (ref 12.0–15.0)
Lymphocytes Relative: 2 %
Lymphs Abs: 0.2 10*3/uL — ABNORMAL LOW (ref 0.7–4.0)
MCH: 28.6 pg (ref 26.0–34.0)
MCHC: 33.6 g/dL (ref 30.0–36.0)
MCV: 84.9 fL (ref 78.0–100.0)
Monocytes Absolute: 0.1 10*3/uL (ref 0.1–1.0)
Monocytes Relative: 1 %
Neutro Abs: 8 10*3/uL — ABNORMAL HIGH (ref 1.7–7.7)
Neutrophils Relative %: 97 %
Platelets: 130 10*3/uL — ABNORMAL LOW (ref 150–400)
RBC: 3.78 MIL/uL — ABNORMAL LOW (ref 3.87–5.11)
RDW: 21.8 % — ABNORMAL HIGH (ref 11.5–15.5)
WBC: 8.3 10*3/uL (ref 4.0–10.5)

## 2016-06-13 LAB — CBC
HCT: 30.3 % — ABNORMAL LOW (ref 36.0–46.0)
Hemoglobin: 10.1 g/dL — ABNORMAL LOW (ref 12.0–15.0)
MCH: 28.3 pg (ref 26.0–34.0)
MCHC: 33.3 g/dL (ref 30.0–36.0)
MCV: 84.9 fL (ref 78.0–100.0)
Platelets: 164 10*3/uL (ref 150–400)
RBC: 3.57 MIL/uL — ABNORMAL LOW (ref 3.87–5.11)
RDW: 22.1 % — ABNORMAL HIGH (ref 11.5–15.5)
WBC: 5.6 10*3/uL (ref 4.0–10.5)

## 2016-06-13 LAB — PROTIME-INR
INR: 2.33
Prothrombin Time: 26 seconds — ABNORMAL HIGH (ref 11.4–15.2)

## 2016-06-13 MED ORDER — ONDANSETRON HCL 4 MG PO TABS
4.0000 mg | ORAL_TABLET | Freq: Once | ORAL | Status: AC
  Administered 2016-06-13: 4 mg via ORAL
  Filled 2016-06-13: qty 1

## 2016-06-13 MED ORDER — ONDANSETRON HCL 4 MG/2ML IJ SOLN
4.0000 mg | Freq: Four times a day (QID) | INTRAMUSCULAR | Status: DC | PRN
Start: 2016-06-13 — End: 2016-06-16

## 2016-06-13 MED ORDER — WARFARIN SODIUM 7.5 MG PO TABS: 3.7500 mg | ORAL_TABLET | Freq: Once | ORAL | Status: DC

## 2016-06-13 MED ORDER — ISOSORBIDE MONONITRATE ER 30 MG PO TB24
30.0000 mg | ORAL_TABLET | Freq: Every day | ORAL | Status: DC
  Administered 2016-06-13 – 2016-06-16 (×4): 30 mg via ORAL
  Filled 2016-06-13 (×4): qty 1

## 2016-06-13 MED ORDER — ONDANSETRON HCL 4 MG PO TABS
4.0000 mg | ORAL_TABLET | Freq: Four times a day (QID) | ORAL | Status: DC
  Administered 2016-06-14 (×2): 4 mg via ORAL
  Filled 2016-06-13 (×4): qty 1

## 2016-06-13 MED ORDER — HYDRALAZINE HCL 20 MG/ML IJ SOLN
5.0000 mg | Freq: Once | INTRAMUSCULAR | Status: AC
  Administered 2016-06-13: 5 mg via INTRAVENOUS
  Filled 2016-06-13: qty 1

## 2016-06-13 MED ORDER — POLYETHYLENE GLYCOL 3350 17 G PO PACK: 17.0000 g | PACK | Freq: Every day | ORAL | 0 refills | Status: DC | PRN

## 2016-06-13 MED ORDER — LOSARTAN POTASSIUM 50 MG PO TABS
50.0000 mg | ORAL_TABLET | Freq: Every day | ORAL | Status: DC
  Administered 2016-06-13 – 2016-06-14 (×2): 50 mg via ORAL
  Filled 2016-06-13 (×2): qty 1

## 2016-06-13 MED ORDER — AMLODIPINE BESYLATE 10 MG PO TABS: 10.0000 mg | ORAL_TABLET | Freq: Every day | ORAL | 0 refills | Status: DC

## 2016-06-13 MED ORDER — FUROSEMIDE 20 MG PO TABS: 20.0000 mg | ORAL_TABLET | Freq: Every day | ORAL | 0 refills | Status: DC

## 2016-06-13 MED ORDER — INSULIN ASPART 100 UNIT/ML ~~LOC~~ SOLN
0.0000 [IU] | SUBCUTANEOUS | Status: DC
  Administered 2016-06-15 – 2016-06-16 (×4): 1 [IU] via SUBCUTANEOUS
  Administered 2016-06-16: 2 [IU] via SUBCUTANEOUS
  Administered 2016-06-16: 1 [IU] via SUBCUTANEOUS
  Administered 2016-06-16: 2 [IU] via SUBCUTANEOUS

## 2016-06-13 MED ORDER — DEXTROSE 5 % IV SOLN
INTRAVENOUS | Status: DC
  Administered 2016-06-14: 01:00:00 via INTRAVENOUS

## 2016-06-13 MED ORDER — LOSARTAN POTASSIUM 50 MG PO TABS: 50.0000 mg | ORAL_TABLET | Freq: Every day | ORAL | 2 refills | Status: DC

## 2016-06-13 NOTE — Progress Notes (Signed)
Pt is up in the chair with light headedness check Blood pressure 190/80s.  pt daughter was getting her ready to go home. Called MD and Cancelled d/c to recheck blood pressure 45 minutes after IV Hydralazine.

## 2016-06-13 NOTE — Progress Notes (Signed)
Pt is awake and alert and oriented no distress, Report given with concerned with Wound on sacrum scant bleeding cleaned and applied foam.

## 2016-06-13 NOTE — Progress Notes (Signed)
[facility] PROGRESS NOTE  Shannon [name] BOF:[id number] DOB: 1923/08/26 DOA: 06/08/2016  PCP: Kirt Boys, DO  Brief History/Interval Summary: 83 year old female with PMH of asthma, bradycardia, CAD, Diastolic HF, CKD stage III, COPD, DVT, GERD, HTN, DM type 2, gastric outlet obstruction s/p G-tube placement, and hyperlipidemia. Presented to ED 3/8 with complaints of syncope and weakness. Upon arrival EMS reported HR 30-40s but mentating well. Cardiology was consulted. Recently patient was started on Cardizem and Bisoprolol. Daughter reported that patient has been taking her medications as prescribed since last admission. In ED she was on EPI gtt and receiving dose of calcium gluconate. Patient was admitted to the intensive care unit after being initiated on dopamine infusion as well as glucagon infusion. She was stabilized and then transferred to stepdown unit.  Reason for Visit: Bradycardia. Hypertension  Consultants: Cardiology. Critical care medicine.  Procedures: None  Antibiotics: None  Subjective/Interval History: Patient feels well. Denies any complaints.  ROS: Denies any nausea or vomiting.  Objective:  Vital Signs  Vitals:   06/13/16 0412 06/13/16 0900 06/13/16 1137 06/13/16 1531  BP: (!) 181/70 (!) 180/60 (!) 173/69 (!) 195/88  Pulse: 62  (!) 53 (!) 57  Resp: (!) 22     Temp: 98.5 F (36.9 C) 98.5 F (36.9 C)    TempSrc: Oral Oral    SpO2: 99% 100%  100%  Weight: 60.7 kg (133 lb 14.4 oz)     Height:        Intake/Output Summary (Last 24 hours) at 06/13/16 1537 Last data filed at 06/13/16 1406  Gross per 24 hour  Intake              720 ml  Output              500 ml  Net              220 ml   Filed Weights   06/10/16 1833 06/11/16 0440 06/13/16 0412  Weight: 61 kg (134 lb 7.7 oz) 60.8 kg (134 lb 0.6 oz) 60.7 kg (133 lb 14.4 oz)    General appearance: alert, cooperative, appears stated age and no distress Resp: clear to auscultation  bilaterally Cardio: S1, S2, is bradycardic, regular. No S3, S4. No rubs, murmurs, or bruit.  GI: soft, non-tender; bowel sounds normal; no masses,  no organomegaly Extremities: No edema Neurologic: Awake and alert. Oriented 3. No focal neurological deficits.  Lab Results:  Data Reviewed: I have personally reviewed following labs and imaging studies  CBC:  Recent Labs Lab 06/08/16 1523  06/08/16 2359 06/10/16 0317 06/11/16 0503 06/12/16 0317 06/13/16 0346  WBC 6.0  --  15.4* 10.4 5.8 5.5 5.6  NEUTROABS 3.5  --   --   --   --   --   --   HGB 9.6*  < > 11.9* 9.4* 9.9* 10.0* 10.1*  HCT 28.5*  < > 34.7* 27.5* 29.5* 29.9* 30.3*  MCV 83.8  --  83.4 82.6 84.8 84.7 84.9  PLT 176  --  196 144* 158 153 164  < > = values in this interval not displayed.  Basic Metabolic Panel:  Recent Labs Lab 06/08/16 1523  06/08/16 2359 06/10/16 0317 06/11/16 0503 06/12/16 0317 06/13/16 0346  NA 135  < > 138 134* 137 137 138  K 4.4  < > 3.9 3.5 4.3 4.3 4.3  CL 99*  < > 103 100* 105 106 106  CO2 29  --  26 24 25  24 22  GLUCOSE 148*  < > 191* 142* 265* 183* 166*  BUN 54*  < > 49* 52* 41* 35* 32*  CREATININE 1.97*  < > 1.60* 1.30* 1.30* 1.26* 1.15*  CALCIUM 8.7*  --  9.3 8.7* 8.8* 8.7* 8.8*  MG 1.9  --  1.9 2.1  --   --   --   PHOS  --   --  3.3 3.3  --   --   --   < > = values in this interval not displayed.  GFR: Estimated Creatinine Clearance: 30.4 mL/min (by C-G formula based on SCr of 1.15 mg/dL (H)).  Liver Function Tests:  Recent Labs Lab 06/08/16 1523  AST 25  ALT 24  ALKPHOS 53  BILITOT 0.6  PROT 5.4*  ALBUMIN 2.8*    Coagulation Profile:  Recent Labs Lab 06/09/16 1109 06/10/16 0317 06/11/16 0503 06/12/16 0317 06/13/16 0346  INR 1.56 1.75 1.92 2.61 2.33     CBG:  Recent Labs Lab 06/12/16 2106 06/12/16 2357 06/13/16 0359 06/13/16 0805 06/13/16 1141  GLUCAP 111* 149* 163* 168* 133*     Recent Results (from the past 240 hour(s))  MRSA PCR Screening      Status: None   Collection Time: 06/08/16  9:27 PM  Result Value Ref Range Status   MRSA by PCR NEGATIVE NEGATIVE Final    Comment:        The GeneXpert MRSA Assay (FDA approved for NASAL specimens only), is one component of a comprehensive MRSA colonization surveillance program. It is not intended to diagnose MRSA infection nor to guide or monitor treatment for MRSA infections.       Radiology Studies: No results found.   Medications:  Scheduled: . amLODipine  10 mg Oral Daily  . atorvastatin  10 mg Oral q1800  . cycloSPORINE  1 drop Both Eyes BID  . feeding supplement (NEPRO CARB STEADY)  237 mL Oral BID BM  . feeding supplement (OSMOLITE 1.5 CAL)  1,000 mL Per Tube Q24H  . feeding supplement (PRO-STAT SUGAR FREE 64)  30 mL Per Tube Daily  . gabapentin  300 mg Oral QHS  . hydrALAZINE  5 mg Intravenous Once  . insulin aspart  0-9 Units Subcutaneous TID WC  . insulin glargine  8 Units Subcutaneous QHS  . isosorbide mononitrate  30 mg Oral Daily  . latanoprost  1 drop Both Eyes QHS  . levothyroxine  137 mcg Oral QAC breakfast  . losartan  50 mg Oral Daily  . mouth rinse  15 mL Mouth Rinse BID  . polyethylene glycol  17 g Oral Daily  . senna  1 tablet Oral Daily  . sertraline  100 mg Oral Daily  . warfarin  3.75 mg Oral ONCE-1800  . Warfarin - Pharmacist Dosing Inpatient   Does not apply q1800   Continuous:  ZOX:WRUEAV chloride, acetaminophen, hydroxypropyl methylcellulose / hypromellose, ipratropium-albuterol, ondansetron (ZOFRAN) IV, RESOURCE THICKENUP CLEAR  Assessment/Plan:  Principal Problem:   Symptomatic bradycardia Active Problems:   Diabetes mellitus type 2 in nonobese (HCC)   Hypothyroidism   Chronic kidney insufficiency, stage 3 (moderate)   PEG (percutaneous endoscopic gastrostomy) status (HCC)   Calcium channel blocker adverse reaction   Elevated TSH    Symptomatic bradycardia in the setting of tachycardia bradycardia syndrome Patient was  recently started on Cardizem and bisoprolol. She came in with symptomatic bradycardia. Patient seen by cardiology. She was placed on dopamine as well as glucagon infusions. Heart rate has stabilized.  She has been taken off of these infusions. Patient has refused pacemaker placement. Cardiology continues to follow. Avoid rate limiting drugs in the future. Patient remains stable.   Paroxysmal atrial fibrillation This appears to be stable. Chads2 vascular score of 6. Continue with anticoagulation with warfarin. Pharmacy is managing.  Diabetes mellitus type 2 Patient did have hyperglycemia as a result of glucagon. She was started on Lantus. She was also started on her tube feedings last night. Lantus dose was adjusted. CBGs have improved some. Did have another episode of hypoglycemia yesterday afternoon. Continue to monitor. Continue with sliding scale coverage. HbA1c back in February was 8.0.  Chronic diastolic congestive heart failure Systolic function has been noted to be normal. Currently appears to be well compensated.  Acute on Chronic kidney disease stage III Creatinine was 2 when she was admitted to the hospital. She is making urine. Creatinine has improved. Continue to monitor closely.  Essential hypertension Blood pressure remains poorly controlled. Dose of her neutropenia was increased. Does of ARB also increased today. Avoid AV nodal blocking agents. Plan was for the patient to be discharged. However, in the afternoon BP was noted to be 195. Patient was feeling a little swimmy headed. She will be given a dose of intravenous hydralazine. Imdur will be reinitiated. Cancel discharge for now.  History of COPD and asthma Stable.  History of gastric outlet obstruction, status post PEG tube She is on tube feedings during the nighttime. Is on full liquid diet during the daytime with nectar thick liquids.  Hypothyroidism Noted to have elevated TSH and mildly elevated free T4. Will not make any  changes to her thyroid medication dose at this time. Continue current dose of levothyroxine. Will recommend repeating her thyroid function tests as an outpatient in 3-4 weeks.  Normocytic Anemia Hemoglobin levels noted to be fluctuating. She does appear to have chronic anemia. No overt bleeding. Continue to monitor.  History of stroke Stable. PT evaluation.  History of depression Continue sertraline.  Constipation Initiated laxatives.  DVT Prophylaxis: On warfarin    Code Status: DO NOT RESUSCITATE  Family Communication: Discussed with the patient  Disposition Plan: Continue management as outlined above. Discharge cancelled.    LOS: 5 days   [facility]  [facility] Pager [phone] 06/13/2016, 3:37 PM  If 7PM-7AM, please contact night-coverage at [email or web address], password [facility]

## 2016-06-13 NOTE — Progress Notes (Signed)
Patient having shortness of breath. RT notified and gave Albuterol treatment and put patient in non-rebreather mask. Charge notified. O2 at 100 %.

## 2016-06-13 NOTE — Progress Notes (Signed)
Patient using accessory muscles while breathing. RRT and MD notified. Md ordered Chest x-ray..Vital signs taken and recorded.

## 2016-06-13 NOTE — Progress Notes (Signed)
RN called earlier in shift. Pt had vomited on day shift and had brief period of desaturation to upper 80s. This was corrected with application of 2L O2 per Gonzales. RN stated pt was using accessory muscles. RRRN responded and did not concur. Lungs clear. Pt awake. RR 18-10. O2 sat 99% on 2L. CXR and KUB ordered. CXR-atelectasis vs infiltrate. Pt is afebrile. CBC with normal WBCC. No antibiotics at this time, will watch. KUB-no ileus or SBO.  Hold TFs tonight and keep pt NPO due to risk of aspiration. Will follow. KJKG, NP [facility]

## 2016-06-13 NOTE — Progress Notes (Signed)
[place] for Warfarin Indication: atrial fibrillation  Vital Signs: Temp: 98.5 F (36.9 C) (03/13 0412) Temp Source: Oral (03/13 0412) BP: 181/70 (03/13 0412) Pulse Rate: 62 (03/13 0412)  Assessment: 89 yoF on Coumadin  PTA for Afib. INR 2.33 today, therapeutic. CBC stable, No bleeding noted per chart.  PTA dose: 3.75mg  daily exc for 2.5mg  on Mon. Recently finished a course of doxy and Levaquin and supratherapeutic INR (5.6) on 3/5, held coumadin 3/5-3/7, INR 1.78 on admission on 3/8  Goal of Therapy:  INR 2-3 Monitor platelets by anticoagulation protocol: Yes   Plan:  Coumadin 3.75 today Daily INR  Maryanna Shape, PharmD, BCPS  Clinical Pharmacist  Pager: [phone]  06/13/2016 8:38 AM

## 2016-06-13 NOTE — Progress Notes (Signed)
Attending rounding plan for repeat in blood pressure in a couple of hours, wound care to treat wound and poss discharge later today.

## 2016-06-13 NOTE — Progress Notes (Signed)
Pt vomiting X2 had an Bowel movement unable to obtain an patent Iv. MD ordered Zofran ODT

## 2016-06-13 NOTE — Progress Notes (Signed)
Progress Note  Patient Name: Shannon [name] Date of Encounter: 06/13/2016  Primary Cardiologist: Dr. Ena Dawley  Subjective   No complaints has been OOB to chair   Inpatient Medications    Scheduled Meds: . amLODipine  10 mg Oral Daily  . atorvastatin  10 mg Oral q1800  . cycloSPORINE  1 drop Both Eyes BID  . feeding supplement (NEPRO CARB STEADY)  237 mL Oral BID BM  . feeding supplement (OSMOLITE 1.5 CAL)  1,000 mL Per Tube Q24H  . feeding supplement (PRO-STAT SUGAR FREE 64)  30 mL Per Tube Daily  . gabapentin  300 mg Oral QHS  . insulin aspart  0-9 Units Subcutaneous TID WC  . insulin glargine  8 Units Subcutaneous QHS  . latanoprost  1 drop Both Eyes QHS  . levothyroxine  137 mcg Oral QAC breakfast  . losartan  25 mg Oral Daily  . mouth rinse  15 mL Mouth Rinse BID  . polyethylene glycol  17 g Oral Daily  . senna  1 tablet Oral Daily  . sertraline  100 mg Oral Daily  . Warfarin - Pharmacist Dosing Inpatient   Does not apply q1800   Continuous Infusions:  PRN Meds: sodium chloride, acetaminophen, hydroxypropyl methylcellulose / hypromellose, ipratropium-albuterol, ondansetron (ZOFRAN) IV, RESOURCE THICKENUP CLEAR   Vital Signs    Vitals:   06/12/16 1233 06/12/16 2005 06/12/16 2136 06/13/16 0412  BP: (!) 174/58 103/65 (!) 159/64 (!) 181/70  Pulse: (!) 55 79 (!) 55 62  Resp: 18 18 18  (!) 22  Temp: 98.2 F (36.8 C) 97.6 F (36.4 C) 98.5 F (36.9 C) 98.5 F (36.9 C)  TempSrc: Oral Oral Oral Oral  SpO2: 100% 97% 98% 99%  Weight:    133 lb 14.4 oz (60.7 kg)  Height:        Intake/Output Summary (Last 24 hours) at 06/13/16 0822 Last data filed at 06/13/16 0412  Gross per 24 hour  Intake           372.17 ml  Output             1200 ml  Net          -827.83 ml   Filed Weights   06/10/16 1833 06/11/16 0440 06/13/16 0412  Weight: 134 lb 7.7 oz (61 kg) 134 lb 0.6 oz (60.8 kg) 133 lb 14.4 oz (60.7 kg)    Telemetry    sinus bradycardia in the 50's -  Personally Reviewed  ECG    No new tracings.   Physical Exam   GEN: Elderly AA female. No acute distress. Wearing a neck collar.  Neck: No JVD Edentul9ous  Cardiac: RRR, SEM murmurs, rubs, or gallops.  Respiratory: expiratory wheezing  GI: Soft, nontender, non-distended  MS: No edema; No deformity. Neuro:  Nonfocal  Psych: Normal affect   Labs    Chemistry Recent Labs Lab 06/08/16 1523  06/11/16 0503 06/12/16 0317 06/13/16 0346  NA 135  < > 137 137 138  K 4.4  < > 4.3 4.3 4.3  CL 99*  < > 105 106 106  CO2 29  < > 25 24 22   GLUCOSE 148*  < > 265* 183* 166*  BUN 54*  < > 41* 35* 32*  CREATININE 1.97*  < > 1.30* 1.26* 1.15*  CALCIUM 8.7*  < > 8.8* 8.7* 8.8*  PROT 5.4*  --   --   --   --   ALBUMIN 2.8*  --   --   --   --  AST 25  --   --   --   --   ALT 24  --   --   --   --   ALKPHOS 53  --   --   --   --   BILITOT 0.6  --   --   --   --   GFRNONAA 21*  < > 35* 36* 41*  GFRAA 25*  < > 41* 42* 47*  ANIONGAP 7  < > 7 7 10   < > = values in this interval not displayed.   Hematology  Recent Labs Lab 06/11/16 0503 06/12/16 0317 06/13/16 0346  WBC 5.8 5.5 5.6  RBC 3.48* 3.53* 3.57*  HGB 9.9* 10.0* 10.1*  HCT 29.5* 29.9* 30.3*  MCV 84.8 84.7 84.9  MCH 28.4 28.3 28.3  MCHC 33.6 33.4 33.3  RDW 21.9* 22.0* 22.1*  PLT 158 153 164    Cardiac EnzymesNo results for input(s): TROPONINI in the last 168 hours.   Recent Labs Lab 06/08/16 1539  TROPIPOC 0.03     BNP  Recent Labs Lab 06/08/16 1523  BNP 1,268.6*     DDimer No results for input(s): DDIMER in the last 168 hours.   Radiology    No results found.  Cardiac Studies   Echocardiogram 05/09/2016: Study Conclusions  - Left ventricle: The cavity size was normal. Wall thickness was normal. Systolic function was normal. The estimated ejection fraction was in the range of 60% to 65%. Wall motion was normal; there were no regional wall motion abnormalities. - Aortic valve: There was trivial  regurgitation. - Left atrium: The atrium was mildly dilated. Volume/bsa, ES, (1-plane Simpson&'s, A2C): 45.6 ml/m^2. - Right atrium: The atrium was moderately dilated. - Tricuspid valve: There was moderate regurgitation. - Pulmonary arteries: Systolic pressure was mildly increased. PA peak pressure: 31 mm Hg (S).  Impressions:  - No cardiac source of emboli was indentified.  Patient Profile     83 y.o. female with hypertension, hyperlipidemia, previous stroke, COPD and asthma, chronic atrial fibrillation, type 2 diabetes mellitus, hypothyroidism, gastric outlet obstruction with previous PEG tube, and diastolic heart failure. She is currently admitted with near-syncope and bradycardia.   Assessment & Plan    1. Symptomatic bradycardia -Hx of tachy-brday syndrome with PAF, admitted with heart rates in the 30's-40's. -She had been on Cardizem and bisoprolol. Amiodarone was dc'd in 05/2016 for suspected lung toxicity. -She was treated with dopamine and glucagon drips initially until stabilized. All AV nodal blocking agents are being held. -She is maintaining sinus bradycardia in the 50's She does not want pacer and currently no indication for one   2. Paroxysmal atrial fibrillation -CHA2DS2/VAS Stroke Risk  Is 9 (CHF, CAD, HTN, Age (2), DM, stroke (2), female). She is anticoagulated with coumadin as she has CKD. INR is therapeutic today. Managed by pharmacy. -Not on any heart rate control due to symptomatic bradycardia on cardizem and bisoprolol. Currently sinus bradycardia in the 50's. -has declined a pacemaker  3. Chronic diastolic heart failure -BNP 1268 on admission -LV EF 60-65% -stable at this time.   4. CKD stage 3 -SCr 2.00 >>>1.15 , stable  5. Hypertension  Increased cozaar avoid AV nodal blocking drugs    Jenkins Rouge

## 2016-06-13 NOTE — Progress Notes (Signed)
Physical Therapy Treatment Patient Details Name: Shannon [name] MRN: [id number] DOB: 1924/01/13 Today's Date: 06/13/2016    History of Present Illness Patient is a 83 yo female admitted 06/08/16 with near syncope due to symptomatic bradycardia.    PMH:  hypertension, dyslipidemia, CVA, COPD and asthma, chronic atrial fibrillation, diastolic congestive heart failure, tachy/bracy syndrome, CKD, CAD, chronic neck pain, diabetes, diabetic neuropathy, anemia, hypothyroidism, gastric outlet obstruction with prior Gtube.    PT Comments    Pt making good progress with mobility.   Follow Up Recommendations  Home health PT;Supervision - Intermittent     Equipment Recommendations  None recommended by PT    Recommendations for Other Services       Precautions / Restrictions Precautions Precautions: Fall Restrictions Weight Bearing Restrictions: No    Mobility  Bed Mobility Overal bed mobility: Needs Assistance Bed Mobility: Supine to Sit     Supine to sit: Modified independent (Device/Increase time)        Transfers Overall transfer level: Needs assistance Equipment used: 4-wheeled walker Transfers: Sit to/from Stand Sit to Stand: Supervision         General transfer comment: Supervision for safety  Ambulation/Gait Ambulation/Gait assistance: Supervision Ambulation Distance (Feet): 225 Feet Assistive device: 4-wheeled walker Gait Pattern/deviations: Step-through pattern;Trunk flexed;Decreased stride length   Gait velocity interpretation: at or above normal speed for age/gender General Gait Details: Amb on RA with SpO2 of 100% after amb. No loss of balance   Stairs            Wheelchair Mobility    Modified Rankin (Stroke Patients Only)       Balance Overall balance assessment: Needs assistance Sitting-balance support: No upper extremity supported;Feet supported Sitting balance-Leahy Scale: Good     Standing balance support: Single extremity  supported;During functional activity Standing balance-Leahy Scale: Poor Standing balance comment: UE support to maintain balance                    Cognition Arousal/Alertness: Awake/alert Behavior During Therapy: WFL for tasks assessed/performed Overall Cognitive Status: Within Functional Limits for tasks assessed                      Exercises      General Comments        Pertinent Vitals/Pain Pain Assessment: No/denies pain    Home Living                      Prior Function            PT Goals (current goals can now be found in the care plan section) Acute Rehab PT Goals PT Goal Formulation: With patient Time For Goal Achievement: 06/20/16 Potential to Achieve Goals: Good Progress towards PT goals: Goals met and updated - see care plan    Frequency    Min 3X/week      PT Plan Current plan remains appropriate    Co-evaluation             End of Session Equipment Utilized During Treatment: Gait belt Activity Tolerance: Patient tolerated treatment well Patient left: with call bell/phone within reach;in chair Nurse Communication: Mobility status PT Visit Diagnosis: Difficulty in walking, not elsewhere classified (R26.2);Muscle weakness (generalized) (M62.81)     Time: 5784-6962 PT Time Calculation (min) (ACUTE ONLY): 22 min  Charges:  $Gait Training: 8-22 mins  G CodesAngelina Ok [place]cok 06/13/2016, 1:24 PM [employer] PT [phone]

## 2016-06-13 NOTE — Progress Notes (Signed)
Patient sitting on BSC having a BM when she suddenly couldn't speak the the Nursing assistance.  Upon my arrival patient spitting out a large amount of sputum. Then She was able to speak normally.  BP 125/56  HR 60.  She had just received medication for BP 195/88 in the past hour.  Assisted back to bed.  Patient at her usually MS baseline.  BP improved to 151/57.  She states she has a HA but it is improving.  Dr Maryland Pink at bedside to assess patient.

## 2016-06-13 NOTE — Consult Note (Signed)
[facility] CM Inpatient Consult   06/13/2016  Shannon [name] 1923-09-28 [id number]   Patient assessed for ongoing follow up with multiple admissions.  Inpatient RNCM, Steward Drone  has assigned the patient to post hospital EMMI calls for HF.  Patient remains active with [facility].  Met with the patient, sitting up in chair. No family in the room.   She states she feels better, no acute distress noted.  She agrees to ongoing follow up calls and states, "they will call my daughter, right?" Daughter contact information remains the same per patient.   Daughter will be the contact person for calls.  For questions, please contact:  Charlesetta Shanks, RN BSN CCM [facility]  [phone] business mobile phone Toll free office [phone]

## 2016-06-14 DIAGNOSIS — Z9189 Other specified personal risk factors, not elsewhere classified: Secondary | ICD-10-CM

## 2016-06-14 DIAGNOSIS — R0902 Hypoxemia: Secondary | ICD-10-CM

## 2016-06-14 DIAGNOSIS — R0682 Tachypnea, not elsewhere classified: Secondary | ICD-10-CM

## 2016-06-14 DIAGNOSIS — Z931 Gastrostomy status: Secondary | ICD-10-CM

## 2016-06-14 LAB — PROTIME-INR
INR: 1.64
Prothrombin Time: 19.6 seconds — ABNORMAL HIGH (ref 11.4–15.2)

## 2016-06-14 LAB — CBC
HCT: 30.6 % — ABNORMAL LOW (ref 36.0–46.0)
Hemoglobin: 10.3 g/dL — ABNORMAL LOW (ref 12.0–15.0)
MCH: 28.5 pg (ref 26.0–34.0)
MCHC: 33.7 g/dL (ref 30.0–36.0)
MCV: 84.8 fL (ref 78.0–100.0)
Platelets: 137 10*3/uL — ABNORMAL LOW (ref 150–400)
RBC: 3.61 MIL/uL — ABNORMAL LOW (ref 3.87–5.11)
RDW: 22 % — ABNORMAL HIGH (ref 11.5–15.5)
WBC: 17.2 10*3/uL — ABNORMAL HIGH (ref 4.0–10.5)

## 2016-06-14 LAB — GLUCOSE, CAPILLARY
Glucose-Capillary: 105 mg/dL — ABNORMAL HIGH (ref 65–99)
Glucose-Capillary: 113 mg/dL — ABNORMAL HIGH (ref 65–99)
Glucose-Capillary: 119 mg/dL — ABNORMAL HIGH (ref 65–99)
Glucose-Capillary: 88 mg/dL (ref 65–99)
Glucose-Capillary: 92 mg/dL (ref 65–99)
Glucose-Capillary: 98 mg/dL (ref 65–99)

## 2016-06-14 MED ORDER — VANCOMYCIN HCL 10 G IV SOLR
1250.0000 mg | INTRAVENOUS | Status: DC
  Filled 2016-06-14: qty 1250

## 2016-06-14 MED ORDER — LEVOTHYROXINE SODIUM 25 MCG PO TABS
137.0000 ug | ORAL_TABLET | Freq: Every day | ORAL | Status: DC
  Administered 2016-06-15: 137 ug
  Filled 2016-06-14: qty 1

## 2016-06-14 MED ORDER — GUAIFENESIN 100 MG/5ML PO SOLN
5.0000 mL | Freq: Four times a day (QID) | ORAL | Status: DC
  Administered 2016-06-14 – 2016-06-15 (×2): 100 mg
  Filled 2016-06-14 (×3): qty 5

## 2016-06-14 MED ORDER — SENNOSIDES 8.8 MG/5ML PO SYRP
5.0000 mL | ORAL_SOLUTION | Freq: Every day | ORAL | Status: DC
  Filled 2016-06-14: qty 5

## 2016-06-14 MED ORDER — DEXTROSE-NACL 5-0.45 % IV SOLN
INTRAVENOUS | Status: DC
  Administered 2016-06-14: 11:00:00 via INTRAVENOUS

## 2016-06-14 MED ORDER — SODIUM CHLORIDE 0.9 % IV SOLN
3.0000 g | Freq: Two times a day (BID) | INTRAVENOUS | Status: DC
  Administered 2016-06-14 (×2): 3 g via INTRAVENOUS
  Filled 2016-06-14 (×3): qty 3

## 2016-06-14 MED ORDER — VANCOMYCIN HCL 10 G IV SOLR
1250.0000 mg | Freq: Once | INTRAVENOUS | Status: AC
  Administered 2016-06-14: 1250 mg via INTRAVENOUS
  Filled 2016-06-14: qty 1250

## 2016-06-14 MED ORDER — LOSARTAN POTASSIUM 50 MG PO TABS
50.0000 mg | ORAL_TABLET | Freq: Every day | ORAL | Status: DC
  Filled 2016-06-14: qty 1

## 2016-06-14 MED ORDER — WARFARIN SODIUM 3 MG PO TABS
6.0000 mg | ORAL_TABLET | Freq: Once | ORAL | Status: AC
Start: 2016-06-14 — End: 2016-06-14
  Administered 2016-06-14: 6 mg via ORAL
  Filled 2016-06-14: qty 2

## 2016-06-14 MED ORDER — ONDANSETRON HCL 4 MG/5ML PO SOLN
4.0000 mg | Freq: Four times a day (QID) | ORAL | Status: DC
  Administered 2016-06-14 – 2016-06-15 (×2): 4 mg
  Filled 2016-06-14 (×3): qty 5

## 2016-06-14 MED ORDER — GABAPENTIN 250 MG/5ML PO SOLN
300.0000 mg | Freq: Every day | ORAL | Status: DC
  Filled 2016-06-14: qty 6

## 2016-06-14 MED ORDER — GABAPENTIN 250 MG/5ML PO SOLN
300.0000 mg | Freq: Every day | ORAL | Status: DC
  Administered 2016-06-14: 300 mg
  Filled 2016-06-14: qty 6

## 2016-06-14 MED ORDER — ATORVASTATIN CALCIUM 10 MG PO TABS
10.0000 mg | ORAL_TABLET | Freq: Every day | ORAL | Status: DC
  Administered 2016-06-14: 10 mg
  Filled 2016-06-14: qty 1

## 2016-06-14 MED ORDER — AMLODIPINE 1 MG/ML ORAL SUSPENSION
10.0000 mg | Freq: Every day | ORAL | Status: DC
  Filled 2016-06-14 (×2): qty 10

## 2016-06-14 MED ORDER — POLYETHYLENE GLYCOL 3350 17 G PO PACK: 17.0000 g | PACK | Freq: Every day | ORAL | Status: DC

## 2016-06-14 MED ORDER — SERTRALINE HCL 20 MG/ML PO CONC
100.0000 mg | Freq: Every day | ORAL | Status: DC
  Filled 2016-06-14: qty 5

## 2016-06-14 MED ORDER — SODIUM CHLORIDE 0.9 % IV SOLN
1.0000 g | Freq: Two times a day (BID) | INTRAVENOUS | Status: DC
  Administered 2016-06-14: 1 g via INTRAVENOUS
  Filled 2016-06-14: qty 1

## 2016-06-14 MED ORDER — GUAIFENESIN ER 600 MG PO TB12
600.0000 mg | ORAL_TABLET | Freq: Two times a day (BID) | ORAL | Status: DC
  Administered 2016-06-14: 600 mg via ORAL
  Filled 2016-06-14: qty 1

## 2016-06-14 NOTE — Progress Notes (Signed)
Physical Therapy Treatment Patient Details Name: Shannon [name] MRN: [id number] DOB: 12-31-1923 Today's Date: 06/14/2016    History of Present Illness Patient is a 83 yo female admitted 06/08/16 with near syncope due to symptomatic bradycardia.    PMH:  hypertension, dyslipidemia, CVA, COPD and asthma, chronic atrial fibrillation, diastolic congestive heart failure, tachy/bracy syndrome, CKD, CAD, chronic neck pain, diabetes, diabetic neuropathy, anemia, hypothyroidism, gastric outlet obstruction with prior Gtube.    PT Comments    Pt making good progress with mobility and participated in bilateral LE strengthening exercises this session (see below). PT will continue to follow acutely to ensure a safe d/c home.    Follow Up Recommendations  Home health PT;Supervision - Intermittent     Equipment Recommendations  None recommended by PT    Recommendations for Other Services       Precautions / Restrictions Precautions Precautions: Fall Restrictions Weight Bearing Restrictions: No    Mobility  Bed Mobility Overal bed mobility: Needs Assistance Bed Mobility: Supine to Sit;Sit to Supine     Supine to sit: Modified independent (Device/Increase time) Sit to supine: Min guard   General bed mobility comments: increased time, use of bed rails  Transfers Overall transfer level: Needs assistance Equipment used: Rolling walker (2 wheeled) Transfers: Sit to/from Stand Sit to Stand: Supervision         General transfer comment: Supervision for safety  Ambulation/Gait             General Gait Details: deferred ambulation at this time as patient was having difficulties with her g-tube and RN present attempting to remedy   Stairs            Wheelchair Mobility    Modified Rankin (Stroke Patients Only)       Balance Overall balance assessment: Needs assistance Sitting-balance support: No upper extremity supported;Feet supported Sitting balance-Leahy Scale:  Good     Standing balance support: During functional activity;Bilateral upper extremity supported Standing balance-Leahy Scale: Poor Standing balance comment: UE support to maintain balance                    Cognition Arousal/Alertness: Awake/alert Behavior During Therapy: WFL for tasks assessed/performed Overall Cognitive Status: Within Functional Limits for tasks assessed                      Exercises General Exercises - Lower Extremity Long Arc Quad: AROM;Strengthening;Both;10 reps;Seated Hip ABduction/ADduction: AROM;Strengthening;Both;10 reps;Standing Hip Flexion/Marching: AROM;Strengthening;Both;20 reps;Standing Mini-Sqauts: AROM;Strengthening;10 reps;Standing    General Comments        Pertinent Vitals/Pain Pain Assessment: No/denies pain    Home Living                      Prior Function            PT Goals (current goals can now be found in the care plan section) Acute Rehab PT Goals PT Goal Formulation: With patient Time For Goal Achievement: 06/20/16 Potential to Achieve Goals: Good Progress towards PT goals: Progressing toward goals    Frequency    Min 3X/week      PT Plan Current plan remains appropriate    Co-evaluation             End of Session Equipment Utilized During Treatment: Gait belt Activity Tolerance: Patient tolerated treatment well Patient left: in bed;with call bell/phone within reach;with nursing/sitter in room Nurse Communication: Mobility status PT Visit Diagnosis: Difficulty in walking, not elsewhere classified (  R26.2);Muscle weakness (generalized) (M62.81)     Time: 1039-1100 PT Time Calculation (min) (ACUTE ONLY): 21 min  Charges:  $Therapeutic Exercise: 8-22 mins                    G CodesAlessandra Bevels Nyeisha Goodall 2016/06/24, 12:04 PM Deborah Chalk, PT, DPT [phone]

## 2016-06-14 NOTE — Progress Notes (Addendum)
[place] for Warfarin Indication: atrial fibrillation  Vital Signs: Temp: 101.6 F (38.7 C) 06-16-2022 0417) Temp Source: Oral Jun 16, 2022 0417) BP: 126/45 2022-06-16 0417) Pulse Rate: 70 06-16-22 0417)  Assessment: 89 yoF on Coumadin PTA for Afib. Didn't receive coumadin last night d/u vomiting and po meds on hold. INR 2.33 > 1.64. CBC stable, No bleeding noted per chart. Now ok to crush med and give per J tube  PTA dose: 3.75mg  daily exc for 2.5mg  on Mon. Recently finished a course of doxy and Levaquin and supratherapeutic INR (5.6) on 3/5, held coumadin 3/5-3/7, INR 1.78 on admission on 3/8  Goal of Therapy:  INR 2-3 Monitor platelets by anticoagulation protocol: Yes   Plan:  Coumadin 6 today Daily INR  Maryanna Shape, PharmD, BCPS  Clinical Pharmacist  Pager: [phone]  06/14/2016 9:46 AM   Addendum: Pt was started vancomycin and merrem empirically for aspiration pneumonia vs. HCAP. Pt has documented penicillin allergy (SOB, hypotension), but tolerated unasyn, augmentin, zosyn, and cephalosporins previously, had a course of Augmentin in December. So recommended switching merrem to unasyn. crcl ~ 30 ml/min.  Plan:  Unasyn 3g IV Q 12 hrs Monitor renal function, f/u cultures.  Maryanna Shape, PharmD, BCPS  Clinical Pharmacist  Pager: [phone]

## 2016-06-14 NOTE — Progress Notes (Signed)
[facility] PROGRESS NOTE  Shannon [name] ZOX:[id number] DOB: 03-28-1924 DOA: 06/08/2016  PCP: Kirt Boys, DO   Subjective/Interval History: She is short of breath this morning, developed fever of 101.6 not feeling good this morning.  Brief History/Interval Summary: 83 year old female with PMH of asthma, bradycardia, CAD, Diastolic HF, CKD stage III, COPD, DVT, GERD, HTN, DM type 2, gastric outlet obstruction s/p G-tube placement, and hyperlipidemia. Presented to ED 3/8 with complaints of syncope and weakness. Upon arrival EMS reported HR 30-40s but mentating well. Cardiology was consulted. Recently patient was started on Cardizem and Bisoprolol. Daughter reported that patient has been taking her medications as prescribed since last admission. In ED she was on EPI gtt and receiving dose of calcium gluconate. Patient was admitted to the intensive care unit after being initiated on dopamine infusion as well as glucagon infusion. She was stabilized and then transferred to stepdown unit.  Reason for Visit: Fever, likely develop aspiration pneumonia  Consultants: Cardiology. Critical care medicine.  Procedures: None  Antibiotics: None   ROS: Denies any nausea or vomiting.  Objective:  Vital Signs  Vitals:   06/13/16 1951 06/13/16 2100 06/14/16 0027 06/14/16 0417  BP:  (!) 139/55  (!) 126/45  Pulse: 81 88 72 70  Resp: (!) 22 16  18   Temp:    (!) 101.6 F (38.7 C)  TempSrc:    Oral  SpO2: 97% 99% 100% 100%  Weight:    60.3 kg (132 lb 14.4 oz)  Height:        Intake/Output Summary (Last 24 hours) at 06/14/16 1030 Last data filed at 06/14/16 1013  Gross per 24 hour  Intake              910 ml  Output              701 ml  Net              209 ml   Filed Weights   06/11/16 0440 06/13/16 0412 06/14/16 0417  Weight: 60.8 kg (134 lb 0.6 oz) 60.7 kg (133 lb 14.4 oz) 60.3 kg (132 lb 14.4 oz)    General appearance: alert, cooperative, appears stated age and no  distress Resp: clear to auscultation bilaterally Cardio: S1, S2, is bradycardic, regular. No S3, S4. No rubs, murmurs, or bruit.  GI: soft, non-tender; bowel sounds normal; no masses,  no organomegaly Extremities: No edema Neurologic: Awake and alert. Oriented 3. No focal neurological deficits.  Lab Results:  Data Reviewed: I have personally reviewed following labs and imaging studies  CBC:  Recent Labs Lab 06/08/16 1523  06/11/16 0503 06/12/16 0317 06/13/16 0346 06/13/16 2215 06/14/16 0455  WBC 6.0  < > 5.8 5.5 5.6 8.3 17.2*  NEUTROABS 3.5  --   --   --   --  8.0*  --   HGB 9.6*  < > 9.9* 10.0* 10.1* 10.8* 10.3*  HCT 28.5*  < > 29.5* 29.9* 30.3* 32.1* 30.6*  MCV 83.8  < > 84.8 84.7 84.9 84.9 84.8  PLT 176  < > 158 153 164 130* 137*  < > = values in this interval not displayed.  Basic Metabolic Panel:  Recent Labs Lab 06/08/16 1523  06/08/16 2359 06/10/16 0317 06/11/16 0503 06/12/16 0317 06/13/16 0346  NA 135  < > 138 134* 137 137 138  K 4.4  < > 3.9 3.5 4.3 4.3 4.3  CL 99*  < > 103 100* 105 106 106  CO2  29  --  26 24 25 24 22   GLUCOSE 148*  < > 191* 142* 265* 183* 166*  BUN 54*  < > 49* 52* 41* 35* 32*  CREATININE 1.97*  < > 1.60* 1.30* 1.30* 1.26* 1.15*  CALCIUM 8.7*  --  9.3 8.7* 8.8* 8.7* 8.8*  MG 1.9  --  1.9 2.1  --   --   --   PHOS  --   --  3.3 3.3  --   --   --   < > = values in this interval not displayed.  GFR: Estimated Creatinine Clearance: 29.8 mL/min (by C-G formula based on SCr of 1.15 mg/dL (H)).  Liver Function Tests:  Recent Labs Lab 06/08/16 1523  AST 25  ALT 24  ALKPHOS 53  BILITOT 0.6  PROT 5.4*  ALBUMIN 2.8*    Coagulation Profile:  Recent Labs Lab 06/10/16 0317 06/11/16 0503 06/12/16 0317 06/13/16 0346 06/14/16 0455  INR 1.75 1.92 2.61 2.33 1.64     CBG:  Recent Labs Lab 06/13/16 1648 06/13/16 2125 06/14/16 0046 06/14/16 0414 06/14/16 0747  GLUCAP 92 82 92 119* 98     Recent Results (from the past  240 hour(s))  MRSA PCR Screening     Status: None   Collection Time: 06/08/16  9:27 PM  Result Value Ref Range Status   MRSA by PCR NEGATIVE NEGATIVE Final    Comment:        The GeneXpert MRSA Assay (FDA approved for NASAL specimens only), is one component of a comprehensive MRSA colonization surveillance program. It is not intended to diagnose MRSA infection nor to guide or monitor treatment for MRSA infections.       Radiology Studies: Dg Chest Port 1 View  Result Date: 06/13/2016 CLINICAL DATA:  Tachypnea with vomiting EXAM: PORTABLE CHEST 1 VIEW COMPARISON:  06/08/2016 FINDINGS: Elevation of the right diaphragm as before. Right lung is grossly clear. Streaky atelectasis versus mild infiltrate at the left lung base. Mild cardiomegaly. Atherosclerosis. No pneumothorax. IMPRESSION: Streaky atelectasis or infiltrate at the left base. Mild cardiomegaly. Electronically Signed   By: Jasmine Pang M.D.   On: 06/13/2016 21:25   Dg Abd Portable 1v  Result Date: 06/13/2016 CLINICAL DATA:  Vomiting today EXAM: PORTABLE ABDOMEN - 1 VIEW COMPARISON:  02/08/2016 FINDINGS: Streaky atelectasis at the left lung base. Surgical clips in the right upper quadrant. Gastro jejunostomy tube is in place. Surgical suture in the central abdomen. Nonobstructed gas pattern.  Mild stool in the rectum. IMPRESSION: Postsurgical changes of the central abdomen. Overall nonobstructed gas pattern with small amount of air within nondilated colon in the right abdomen and scattered stool in the rectum. Electronically Signed   By: Jasmine Pang M.D.   On: 06/13/2016 21:27     Medications:  Scheduled: . amLODipine  10 mg Oral Daily  . atorvastatin  10 mg Oral q1800  . cycloSPORINE  1 drop Both Eyes BID  . feeding supplement (NEPRO CARB STEADY)  237 mL Oral BID BM  . feeding supplement (OSMOLITE 1.5 CAL)  1,000 mL Per Tube Q24H  . feeding supplement (PRO-STAT SUGAR FREE 64)  30 mL Per Tube Daily  . gabapentin  300  mg Oral QHS  . guaiFENesin  600 mg Oral BID  . insulin aspart  0-9 Units Subcutaneous Q4H  . isosorbide mononitrate  30 mg Oral Daily  . latanoprost  1 drop Both Eyes QHS  . levothyroxine  137 mcg Oral QAC breakfast  .  losartan  50 mg Oral Daily  . mouth rinse  15 mL Mouth Rinse BID  . meropenem (MERREM) IV  1 g Intravenous Q12H  . ondansetron  4 mg Oral QID  . polyethylene glycol  17 g Oral Daily  . senna  1 tablet Oral Daily  . sertraline  100 mg Oral Daily  . vancomycin  1,250 mg Intravenous Once  . [START ON 06/15/2016] vancomycin  1,250 mg Intravenous Q24H  . warfarin  6 mg Oral ONCE-1800  . Warfarin - Pharmacist Dosing Inpatient   Does not apply q1800   Continuous: . dextrose 5 % and 0.45% NaCl 1,000 mL infusion     ZOX:WRUEAV chloride, acetaminophen, hydroxypropyl methylcellulose / hypromellose, ipratropium-albuterol, ondansetron (ZOFRAN) IV, ondansetron, RESOURCE THICKENUP CLEAR  Assessment/Plan:  Principal Problem:   Symptomatic bradycardia Active Problems:   Diabetes mellitus type 2 in nonobese (HCC)   Hypothyroidism   Chronic kidney insufficiency, stage 3 (moderate)   PEG (percutaneous endoscopic gastrostomy) status (HCC)   Calcium channel blocker adverse reaction   Elevated TSH    Pneumonia -Hospital acquired pneumonia versus aspiration, developed fever, cytosis and shortness of breath. -Has history of dysphagia and on dysphagia 2 diet with nectar thick liquids, getting tube feeding for gastric outlet obstruction. -Started on meropenem and vancomycin initially because of what is reported to be penicillins allergy. -Discussed with pharmacist, reportedly patient received Unasyn and Zosyn previously, switched to Unasyn. -Added Mucinex and DuoNeb  Symptomatic bradycardia in the setting of tachycardia bradycardia syndrome Patient was recently started on Cardizem and bisoprolol. She came in with symptomatic bradycardia. Patient seen by cardiology. She was placed on  dopamine as well as glucagon infusions. Heart rate has stabilized. She has been taken off of these infusions. Patient has refused pacemaker placement. Cardiology continues to follow.  -Avoid AV nodal slowing medication.  Paroxysmal atrial fibrillation -This appears to be stable. Chads2 vascular score of 6. Continue with anticoagulation with warfarin. Pharmacy is managing. -INR is 1.6.  Diabetes mellitus type 2 Patient did have hyperglycemia as a result of glucagon. She was started on Lantus. She was also started on her tube feedings last night. Lantus dose was adjusted. CBGs have improved some. Did have another episode of hypoglycemia yesterday afternoon. Continue to monitor. Continue with sliding scale coverage. HbA1c back in February was 8.0.  Chronic diastolic congestive heart failure Systolic function has been noted to be normal. Currently appears to be well compensated.  Acute on Chronic kidney disease stage III Creatinine was 2 when she was admitted to the hospital. She is making urine. Creatinine has improved. Continue to monitor closely.  Essential hypertension Blood pressure remains poorly controlled. Dose of her neutropenia was increased. Does of ARB also increased today. Avoid AV nodal blocking agents. Plan was for the patient to be discharged. However, in the afternoon BP was noted to be 195. Patient was feeling a little swimmy headed. She will be given a dose of intravenous hydralazine. Imdur will be reinitiated. Cancel discharge for now.  History of COPD and asthma Stable.  History of gastric outlet obstruction, status post PEG tube She is on tube feedings during the nighttime. Is on full liquid diet during the daytime with nectar thick liquids.  Hypothyroidism Noted to have elevated TSH and mildly elevated free T4. Will not make any changes to her thyroid medication dose at this time. Continue current dose of levothyroxine. Will recommend repeating her thyroid function tests  as an outpatient in 3-4 weeks.  Normocytic Anemia Hemoglobin  levels noted to be fluctuating. She does appear to have chronic anemia. No overt bleeding. Continue to monitor.  History of stroke Stable. PT evaluation.  History of depression Continue sertraline.  Constipation Initiated laxatives.  Pressure ulcer Per nursing documentation Pressure Injury 06/10/16 Stage II - Partial thickness loss of dermis presenting as a shallow open ulcer with a red, pink wound bed without slough.  DVT Prophylaxis: On warfarin    Code Status: DO NOT RESUSCITATE  Family Communication: Discussed with the patient  Disposition Plan: Continue management as outlined above. Discharge cancelled.    LOS: 6 days   [facility] A  [facility] Pager [phone] 06/14/2016, 10:30 AM  If 7PM-7AM, please contact night-coverage at [email or web address], password [facility]

## 2016-06-14 NOTE — Progress Notes (Signed)
Per MD, ok to crush PO meds, and admin via J-tube.

## 2016-06-14 NOTE — Progress Notes (Signed)
Pharmacy Antibiotic Note  Shannon [name] is a 83 y.o. female admitted on 06/08/2016 for symptomatic bradycardia, now with atelectasis vs infiltrate on CXR.  Pharmacy has been consulted for Vancocin and Merrem dosing.  Plan: Vancomycin 1250mg  IV every 24 hours (based on vanc trough ~2wk ago during prior admission).  Goal trough 15-20 mcg/mL. Merrem 1g IV every 12 hours.  Height: 5\' 6"  (167.6 cm) Weight: 132 lb 14.4 oz (60.3 kg) (scale b) IBW/kg (Calculated) : 59.3  Temp (24hrs), Avg:99.4 F (37.4 C), Min:98 F (36.7 C), Max:101.6 F (38.7 C)   Recent Labs Lab 06/08/16 2131 06/08/16 2359 06/10/16 0317 06/11/16 0503 06/12/16 0317 06/13/16 0346 06/13/16 2215 06/14/16 0455  WBC  --  15.4* 10.4 5.8 5.5 5.6 8.3 17.2*  CREATININE  --  1.60* 1.30* 1.30* 1.26* 1.15*  --   --   LATICACIDVEN 1.8 1.0  --   --   --   --   --   --     Estimated Creatinine Clearance: 29.8 mL/min (by C-G formula based on SCr of 1.15 mg/dL (H)).    Allergies  Allergen Reactions  . Penicillins Itching, Rash and Other (See Comments)    Has patient had a PCN reaction causing immediate rash, facial/tongue/throat swelling, SOB or lightheadedness with hypotension: Yes Has patient had a PCN reaction causing severe rash involving mucus membranes or skin necrosis: Unk Has patient had a PCN reaction that required hospitalization: Unk Has patient had a PCN reaction occurring within the last 10 years: Unk If all of the above answers are "NO", then may proceed with Cephalosporin use.      Thank you for allowing pharmacy to be a part of this patient's care.  Wynona Neat, PharmD, BCPS  06/14/2016 7:57 AM

## 2016-06-14 NOTE — Significant Event (Signed)
Rapid Response Event Note RN called for accessory muscle use, vomiting several times, and hypotensive Overview: Time Called: 2057 Arrival Time: 2058 Event Type: Respiratory  Initial Focused Assessment: On arrival pt supine in bed, skin warm and dry, resting. Lungs clear bilaterally, pt taking in a deep breath with RR 16-18, RN reports pt had vomited x2 two prior to shift change, drop in BP. Pt denies any pain or SOB. BP 139/55, HR 88, 99% 2L [place]. Baltazar Najjar NP ordered CXR and KUB.  Interventions: Baltazar Najjar NP dc'd tube feed tonight to prevent possible aspiration due to previous vomiting episodes  Plan of Care (if not transferred): Continue to monitor pt, spot check SpO2 and call RRT and Baltazar Najjar NP if stas drop. Christina RN made aware of plan.  Event Summary: Name of Physician Notified: Baltazar Najjar NP  at  (PTA RRT )    at    Outcome: Stayed in room and stabalized     [place], [place]

## 2016-06-14 NOTE — Progress Notes (Addendum)
Tube Feedings not administered per Baltazar Najjar, NP order. Instructed to hold feedings tonight( due to patient vomiting last night and previous order to hold TF's until furder notice ) and have day RN ask attending for new orders. Will inform day shift nurse. Will continue to monitor.  Ying Blankenhorn, RN

## 2016-06-14 NOTE — Progress Notes (Signed)
Progress Note  Patient Name: Shannon [name] Date of Encounter: 06/14/2016  Primary Cardiologist: Dr. Ena Dawley  Subjective   Rough day yesterday some dyspnea and abdominal pain with vomiting   Inpatient Medications    Scheduled Meds: . amLODipine  10 mg Oral Daily  . atorvastatin  10 mg Oral q1800  . cycloSPORINE  1 drop Both Eyes BID  . feeding supplement (NEPRO CARB STEADY)  237 mL Oral BID BM  . feeding supplement (OSMOLITE 1.5 CAL)  1,000 mL Per Tube Q24H  . feeding supplement (PRO-STAT SUGAR FREE 64)  30 mL Per Tube Daily  . gabapentin  300 mg Oral QHS  . guaiFENesin  600 mg Oral BID  . insulin aspart  0-9 Units Subcutaneous Q4H  . isosorbide mononitrate  30 mg Oral Daily  . latanoprost  1 drop Both Eyes QHS  . levothyroxine  137 mcg Oral QAC breakfast  . losartan  50 mg Oral Daily  . mouth rinse  15 mL Mouth Rinse BID  . meropenem (MERREM) IV  1 g Intravenous Q12H  . ondansetron  4 mg Oral QID  . polyethylene glycol  17 g Oral Daily  . senna  1 tablet Oral Daily  . sertraline  100 mg Oral Daily  . vancomycin  1,250 mg Intravenous Once  . [START ON 06/15/2016] vancomycin  1,250 mg Intravenous Q24H  . warfarin  3.75 mg Oral ONCE-1800  . Warfarin - Pharmacist Dosing Inpatient   Does not apply q1800   Continuous Infusions:  PRN Meds: sodium chloride, acetaminophen, hydroxypropyl methylcellulose / hypromellose, ipratropium-albuterol, ondansetron (ZOFRAN) IV, ondansetron, RESOURCE THICKENUP CLEAR   Vital Signs    Vitals:   06/13/16 1951 06/13/16 2100 06/14/16 0027 06/14/16 0417  BP:  (!) 139/55  (!) 126/45  Pulse: 81 88 72 70  Resp: (!) 22 16  18   Temp:    (!) 101.6 F (38.7 C)  TempSrc:    Oral  SpO2: 97% 99% 100% 100%  Weight:    132 lb 14.4 oz (60.3 kg)  Height:        Intake/Output Summary (Last 24 hours) at 06/14/16 0834 Last data filed at 06/14/16 0429  Gross per 24 hour  Intake              910 ml  Output              701 ml  Net               209 ml   Filed Weights   06/11/16 0440 06/13/16 0412 06/14/16 0417  Weight: 134 lb 0.6 oz (60.8 kg) 133 lb 14.4 oz (60.7 kg) 132 lb 14.4 oz (60.3 kg)    Telemetry    sinus bradycardia in the 50's - Personally Reviewed  ECG    No new tracings.   Physical Exam   GEN: Elderly AA female. No acute distress. Wearing a neck collar.  Neck: No JVD Edentul9ous  Cardiac: RRR, SEM murmurs, rubs, or gallops.  Respiratory: expiratory wheezing  GI: Soft, nontender, non-distended  MS: No edema; No deformity. Neuro:  Nonfocal  Psych: Normal affect   Labs    Chemistry Recent Labs Lab 06/08/16 1523  06/11/16 0503 06/12/16 0317 06/13/16 0346  NA 135  < > 137 137 138  K 4.4  < > 4.3 4.3 4.3  CL 99*  < > 105 106 106  CO2 29  < > 25 24 22   GLUCOSE 148*  < >  265* 183* 166*  BUN 54*  < > 41* 35* 32*  CREATININE 1.97*  < > 1.30* 1.26* 1.15*  CALCIUM 8.7*  < > 8.8* 8.7* 8.8*  PROT 5.4*  --   --   --   --   ALBUMIN 2.8*  --   --   --   --   AST 25  --   --   --   --   ALT 24  --   --   --   --   ALKPHOS 53  --   --   --   --   BILITOT 0.6  --   --   --   --   GFRNONAA 21*  < > 35* 36* 41*  GFRAA 25*  < > 41* 42* 47*  ANIONGAP 7  < > 7 7 10   < > = values in this interval not displayed.   Hematology  Recent Labs Lab 06/13/16 0346 06/13/16 2215 06/14/16 0455  WBC 5.6 8.3 17.2*  RBC 3.57* 3.78* 3.61*  HGB 10.1* 10.8* 10.3*  HCT 30.3* 32.1* 30.6*  MCV 84.9 84.9 84.8  MCH 28.3 28.6 28.5  MCHC 33.3 33.6 33.7  RDW 22.1* 21.8* 22.0*  PLT 164 130* 137*    Cardiac EnzymesNo results for input(s): TROPONINI in the last 168 hours.   Recent Labs Lab 06/08/16 1539  TROPIPOC 0.03     BNP  Recent Labs Lab 06/08/16 1523  BNP 1,268.6*     DDimer No results for input(s): DDIMER in the last 168 hours.   Radiology    Dg Chest Port 1 View  Result Date: 06/13/2016 CLINICAL DATA:  Tachypnea with vomiting EXAM: PORTABLE CHEST 1 VIEW COMPARISON:  06/08/2016 FINDINGS: Elevation  of the right diaphragm as before. Right lung is grossly clear. Streaky atelectasis versus mild infiltrate at the left lung base. Mild cardiomegaly. Atherosclerosis. No pneumothorax. IMPRESSION: Streaky atelectasis or infiltrate at the left base. Mild cardiomegaly. Electronically Signed   By: Donavan Foil M.D.   On: 06/13/2016 21:25   Dg Abd Portable 1v  Result Date: 06/13/2016 CLINICAL DATA:  Vomiting today EXAM: PORTABLE ABDOMEN - 1 VIEW COMPARISON:  02/08/2016 FINDINGS: Streaky atelectasis at the left lung base. Surgical clips in the right upper quadrant. Gastro jejunostomy tube is in place. Surgical suture in the central abdomen. Nonobstructed gas pattern.  Mild stool in the rectum. IMPRESSION: Postsurgical changes of the central abdomen. Overall nonobstructed gas pattern with small amount of air within nondilated colon in the right abdomen and scattered stool in the rectum. Electronically Signed   By: Donavan Foil M.D.   On: 06/13/2016 21:27    Cardiac Studies   Echocardiogram 05/09/2016: Study Conclusions  - Left ventricle: The cavity size was normal. Wall thickness was normal. Systolic function was normal. The estimated ejection fraction was in the range of 60% to 65%. Wall motion was normal; there were no regional wall motion abnormalities. - Aortic valve: There was trivial regurgitation. - Left atrium: The atrium was mildly dilated. Volume/bsa, ES, (1-plane Simpson&'s, A2C): 45.6 ml/m^2. - Right atrium: The atrium was moderately dilated. - Tricuspid valve: There was moderate regurgitation. - Pulmonary arteries: Systolic pressure was mildly increased. PA peak pressure: 31 mm Hg (S).  Impressions:  - No cardiac source of emboli was indentified.  Patient Profile     83 y.o. female with hypertension, hyperlipidemia, previous stroke, COPD and asthma, chronic atrial fibrillation, type 2 diabetes mellitus, hypothyroidism, gastric outlet obstruction with previous PEG  tube,  and diastolic heart failure. She is currently admitted with near-syncope and bradycardia.   Assessment & Plan    1. Symptomatic bradycardia -Hx of tachy-brday syndrome with PAF, admitted with heart rates in the 30's-40's. -She had been on Cardizem and bisoprolol. Amiodarone was dc'd in 05/2016 for suspected lung toxicity. -She was treated with dopamine and glucagon drips initially until stabilized. All AV nodal blocking agents are being held. -She is maintaining sinus bradycardia in the 50's She does not want pacer and currently no indication for one   2. Paroxysmal atrial fibrillation -CHA2DS2/VAS Stroke Risk  Is 9 (CHF, CAD, HTN, Age (2), DM, stroke (2), female). She is anticoagulated with coumadin as she has CKD. INR is sub Rx today at 1.6. Managed by pharmacy. -Not on any heart rate control due to symptomatic bradycardia on cardizem and bisoprolol. Currently sinus bradycardia in the 50's. -has declined a pacemaker  3. Chronic diastolic heart failure -BNP 1268 on admission -LV EF 60-65% -stable at this time.   4. CKD stage 3 -SCr 2.00 >>>1.15 , stable  5. Hypertension  Increased cozaar avoid AV nodal blocking drugs  6. Dyspnea :  CXR no CHF seems better this am plan per primary service ? Aspiration  7. Abdominal Pain:  Exam benign KUB no ileus or obstruction currently NPO   Cardiac status is stable will sign off   Jenkins Rouge

## 2016-06-15 DIAGNOSIS — E119 Type 2 diabetes mellitus without complications: Secondary | ICD-10-CM

## 2016-06-15 DIAGNOSIS — E039 Hypothyroidism, unspecified: Secondary | ICD-10-CM

## 2016-06-15 LAB — BASIC METABOLIC PANEL
Anion gap: 8 (ref 5–15)
BUN: 35 mg/dL — ABNORMAL HIGH (ref 6–20)
CO2: 20 mmol/L — ABNORMAL LOW (ref 22–32)
Calcium: 8.2 mg/dL — ABNORMAL LOW (ref 8.9–10.3)
Chloride: 108 mmol/L (ref 101–111)
Creatinine, Ser: 1.71 mg/dL — ABNORMAL HIGH (ref 0.44–1.00)
GFR calc Af Amer: 29 mL/min — ABNORMAL LOW (ref >60)
GFR calc non Af Amer: 25 mL/min — ABNORMAL LOW (ref >60)
Glucose, Bld: 110 mg/dL — ABNORMAL HIGH (ref 65–99)
Potassium: 4.7 mmol/L (ref 3.5–5.1)
Sodium: 136 mmol/L (ref 135–145)

## 2016-06-15 LAB — CBC
HCT: 27.8 % — ABNORMAL LOW (ref 36.0–46.0)
Hemoglobin: 9.3 g/dL — ABNORMAL LOW (ref 12.0–15.0)
MCH: 28.4 pg (ref 26.0–34.0)
MCHC: 33.5 g/dL (ref 30.0–36.0)
MCV: 84.8 fL (ref 78.0–100.0)
Platelets: 123 10*3/uL — ABNORMAL LOW (ref 150–400)
RBC: 3.28 MIL/uL — ABNORMAL LOW (ref 3.87–5.11)
RDW: 22.6 % — ABNORMAL HIGH (ref 11.5–15.5)
WBC: 13.6 10*3/uL — ABNORMAL HIGH (ref 4.0–10.5)

## 2016-06-15 LAB — GLUCOSE, CAPILLARY
Glucose-Capillary: 102 mg/dL — ABNORMAL HIGH (ref 65–99)
Glucose-Capillary: 125 mg/dL — ABNORMAL HIGH (ref 65–99)
Glucose-Capillary: 116 mg/dL — ABNORMAL HIGH (ref 65–99)
Glucose-Capillary: 153 mg/dL — ABNORMAL HIGH (ref 65–99)
Glucose-Capillary: 126 mg/dL — ABNORMAL HIGH (ref 65–99)
Glucose-Capillary: 130 mg/dL — ABNORMAL HIGH (ref 65–99)

## 2016-06-15 LAB — PROTIME-INR
INR: 1.44
Prothrombin Time: 17.7 seconds — ABNORMAL HIGH (ref 11.4–15.2)

## 2016-06-15 MED ORDER — LEVOTHYROXINE SODIUM 25 MCG PO TABS
137.0000 ug | ORAL_TABLET | Freq: Every day | ORAL | Status: DC
  Administered 2016-06-16: 137 ug via ORAL
  Filled 2016-06-15: qty 1

## 2016-06-15 MED ORDER — WARFARIN SODIUM 5 MG PO TABS
5.0000 mg | ORAL_TABLET | Freq: Once | ORAL | Status: AC
  Administered 2016-06-15: 5 mg via ORAL
  Filled 2016-06-15: qty 1

## 2016-06-15 MED ORDER — ONDANSETRON 4 MG PO TBDP
4.0000 mg | ORAL_TABLET | Freq: Four times a day (QID) | ORAL | Status: DC
  Administered 2016-06-15 – 2016-06-16 (×4): 4 mg via ORAL
  Filled 2016-06-15 (×4): qty 1

## 2016-06-15 MED ORDER — PRO-STAT SUGAR FREE PO LIQD
30.0000 mL | Freq: Every day | ORAL | Status: DC
  Administered 2016-06-15 – 2016-06-16 (×2): 30 mL via ORAL
  Filled 2016-06-15: qty 30

## 2016-06-15 MED ORDER — SENNOSIDES-DOCUSATE SODIUM 8.6-50 MG PO TABS
1.0000 | ORAL_TABLET | Freq: Every day | ORAL | Status: DC
  Administered 2016-06-16: 1 via ORAL
  Filled 2016-06-15 (×2): qty 1

## 2016-06-15 MED ORDER — OSMOLITE 1.5 CAL PO LIQD
1000.0000 mL | ORAL | Status: DC
  Administered 2016-06-15: 1000 mL
  Filled 2016-06-15 (×2): qty 1000

## 2016-06-15 MED ORDER — SERTRALINE HCL 100 MG PO TABS
100.0000 mg | ORAL_TABLET | Freq: Every day | ORAL | Status: DC
  Administered 2016-06-15 – 2016-06-16 (×2): 100 mg via ORAL
  Filled 2016-06-15 (×2): qty 1

## 2016-06-15 MED ORDER — RESOURCE THICKENUP CLEAR PO POWD: ORAL | Status: DC | PRN

## 2016-06-15 MED ORDER — AMOXICILLIN-POT CLAVULANATE 500-125 MG PO TABS
1.0000 | ORAL_TABLET | ORAL | Status: DC
  Administered 2016-06-15 – 2016-06-16 (×2): 500 mg via ORAL
  Filled 2016-06-15 (×2): qty 1

## 2016-06-15 MED ORDER — DEXTROSE-NACL 5-0.45 % IV SOLN
INTRAVENOUS | Status: DC
  Administered 2016-06-15: 06:00:00 via INTRAVENOUS

## 2016-06-15 MED ORDER — AMLODIPINE BESYLATE 10 MG PO TABS
10.0000 mg | ORAL_TABLET | Freq: Every day | ORAL | Status: DC
  Administered 2016-06-15 – 2016-06-16 (×2): 10 mg via ORAL
  Filled 2016-06-15 (×2): qty 1

## 2016-06-15 MED ORDER — VANCOMYCIN HCL IN DEXTROSE 1-5 GM/200ML-% IV SOLN: 1000.0000 mg | INTRAVENOUS | Status: DC

## 2016-06-15 MED ORDER — GABAPENTIN 300 MG PO CAPS
300.0000 mg | ORAL_CAPSULE | Freq: Every day | ORAL | Status: DC
  Administered 2016-06-15: 300 mg via ORAL
  Filled 2016-06-15: qty 1

## 2016-06-15 MED ORDER — AMOXICILLIN-POT CLAVULANATE 875-125 MG PO TABS: 1.0000 | ORAL_TABLET | Freq: Two times a day (BID) | ORAL | Status: DC

## 2016-06-15 MED ORDER — ATORVASTATIN CALCIUM 10 MG PO TABS
10.0000 mg | ORAL_TABLET | Freq: Every day | ORAL | Status: DC
  Administered 2016-06-15: 10 mg via ORAL
  Filled 2016-06-15: qty 1

## 2016-06-15 MED ORDER — GUAIFENESIN 100 MG/5ML PO SOLN
5.0000 mL | Freq: Four times a day (QID) | ORAL | Status: DC
  Administered 2016-06-15 – 2016-06-16 (×4): 100 mg via ORAL
  Filled 2016-06-15 (×3): qty 5

## 2016-06-15 MED ORDER — LOSARTAN POTASSIUM 50 MG PO TABS: 50.0000 mg | ORAL_TABLET | Freq: Every day | ORAL | Status: DC

## 2016-06-15 MED ORDER — POLYETHYLENE GLYCOL 3350 17 G PO PACK
17.0000 g | PACK | Freq: Every day | ORAL | Status: DC
  Filled 2016-06-15: qty 1

## 2016-06-15 MED ORDER — LOSARTAN POTASSIUM 50 MG PO TABS
50.0000 mg | ORAL_TABLET | Freq: Every day | ORAL | Status: DC
  Administered 2016-06-15 – 2016-06-16 (×2): 50 mg via ORAL
  Filled 2016-06-15 (×2): qty 1

## 2016-06-15 NOTE — Progress Notes (Addendum)
[place] for Warfarin Indication: atrial fibrillation  Vital Signs: Temp: 98 F (36.7 C) (03/15 0427) Temp Source: Oral (03/15 0427) BP: 118/59 (03/15 0427) Pulse Rate: 66 (03/15 0427)  Assessment: 89 yoF on Coumadin PTA for Afib. INR down to 1.44 this morning, Didn't receive coumadin 3/13-3/14. hgb/plt sl trending down, No bleeding noted per chart.   PTA dose: 3.75mg  daily exc for 2.5mg  on Mon. Recently finished a course of doxy and Levaquin and supratherapeutic INR (5.6) on 3/5, held coumadin 3/5-3/7, INR 1.78 on admission on 3/8  Goal of Therapy:  INR 2-3 Monitor platelets by anticoagulation protocol: Yes   Plan:  Coumadin 5 today Daily INR Monitor CBC closely  Maryanna Shape, PharmD, BCPS  Clinical Pharmacist  Pager: [phone]  06/15/2016 8:30 AM   Addendum: Pt's scr is trending up, 1.15 > 1.71 she only received one dose of vancomycin 1250 mg yesterday morning. Will change vancomycin to 1000 mg IV Q 48hrs now, next dose tomorrow. Might consider checking random level before next dose if scr continue trending up.  Maryanna Shape, PharmD, BCPS  Clinical Pharmacist  Pager: [phone]

## 2016-06-15 NOTE — Progress Notes (Signed)
Page to Dr Hartford Poli  3e30 per family/pt she cannot take the dys2 diet due to hx of complications with PEG tube. pt does well with liquid diet, thickening to nectar thick  ___________________________  Shannon [name] notified of this as well, to document for future reference.  ___________________________   Per DR Hartford Poli, Pt is ok to return to full liquid, nectar thick diet.

## 2016-06-15 NOTE — Progress Notes (Signed)
[facility] PROGRESS NOTE  Shannon [name] BMW:[id number] DOB: 03-Aug-1923 DOA: 06/08/2016  PCP: Kirt Boys, DO   Subjective/Interval History: Feels much better this AM, no fever or chills.  Brief History/Interval Summary: 83 year old female with PMH of asthma, bradycardia, CAD, Diastolic HF, CKD stage III, COPD, DVT, GERD, HTN, DM type 2, gastric outlet obstruction s/p G-tube placement, and hyperlipidemia. Presented to ED 3/8 with complaints of syncope and weakness. Upon arrival EMS reported HR 30-40s but mentating well. Cardiology was consulted. Recently patient was started on Cardizem and Bisoprolol. Daughter reported that patient has been taking her medications as prescribed since last admission. In ED she was on EPI gtt and receiving dose of calcium gluconate. Patient was admitted to the intensive care unit after being initiated on dopamine infusion as well as glucagon infusion. She was stabilized and then transferred to stepdown unit.  Reason for Visit: Fever, likely develop aspiration pneumonia  Consultants: Cardiology. Critical care medicine.  Procedures: None  Antibiotics: None   ROS: Denies any nausea or vomiting.  Objective:  Vital Signs  Vitals:   06/14/16 0417 06/14/16 1216 06/14/16 2004 06/15/16 0427  BP: (!) 126/45 (!) 120/45 135/60 (!) 118/59  Pulse: 70 68 71 66  Resp: 18 16 16    Temp: (!) 101.6 F (38.7 C) 97.4 F (36.3 C) 98.2 F (36.8 C) 98 F (36.7 C)  TempSrc: Oral Oral Oral Oral  SpO2: 100% 97% 96% 98%  Weight: 60.3 kg (132 lb 14.4 oz)   60.3 kg (133 lb)  Height:        Intake/Output Summary (Last 24 hours) at 06/15/16 1145 Last data filed at 06/15/16 0800  Gross per 24 hour  Intake          3650.33 ml  Output             1050 ml  Net          2600.33 ml   Filed Weights   06/13/16 0412 06/14/16 0417 06/15/16 0427  Weight: 60.7 kg (133 lb 14.4 oz) 60.3 kg (132 lb 14.4 oz) 60.3 kg (133 lb)    General appearance: alert, cooperative,  appears stated age and no distress Resp: clear to auscultation bilaterally Cardio: S1, S2, is bradycardic, regular. No S3, S4. No rubs, murmurs, or bruit.  GI: soft, non-tender; bowel sounds normal; no masses,  no organomegaly Extremities: No edema Neurologic: Awake and alert. Oriented 3. No focal neurological deficits.  Lab Results:  Data Reviewed: I have personally reviewed following labs and imaging studies  CBC:  Recent Labs Lab 06/08/16 1523  06/12/16 0317 06/13/16 0346 06/13/16 2215 06/14/16 0455 06/15/16 0400  WBC 6.0  < > 5.5 5.6 8.3 17.2* 13.6*  NEUTROABS 3.5  --   --   --  8.0*  --   --   HGB 9.6*  < > 10.0* 10.1* 10.8* 10.3* 9.3*  HCT 28.5*  < > 29.9* 30.3* 32.1* 30.6* 27.8*  MCV 83.8  < > 84.7 84.9 84.9 84.8 84.8  PLT 176  < > 153 164 130* 137* 123*  < > = values in this interval not displayed.  Basic Metabolic Panel:  Recent Labs Lab 06/08/16 1523  06/08/16 2359 06/10/16 0317 06/11/16 0503 06/12/16 0317 06/13/16 0346 06/15/16 0400  NA 135  < > 138 134* 137 137 138 136  K 4.4  < > 3.9 3.5 4.3 4.3 4.3 4.7  CL 99*  < > 103 100* 105 106 106 108  CO2 29  --  26 24 25 24 22  20*  GLUCOSE 148*  < > 191* 142* 265* 183* 166* 110*  BUN 54*  < > 49* 52* 41* 35* 32* 35*  CREATININE 1.97*  < > 1.60* 1.30* 1.30* 1.26* 1.15* 1.71*  CALCIUM 8.7*  --  9.3 8.7* 8.8* 8.7* 8.8* 8.2*  MG 1.9  --  1.9 2.1  --   --   --   --   PHOS  --   --  3.3 3.3  --   --   --   --   < > = values in this interval not displayed.  GFR: Estimated Creatinine Clearance: 20.1 mL/min (A) (by C-G formula based on SCr of 1.71 mg/dL (H)).  Liver Function Tests:  Recent Labs Lab 06/08/16 1523  AST 25  ALT 24  ALKPHOS 53  BILITOT 0.6  PROT 5.4*  ALBUMIN 2.8*    Coagulation Profile:  Recent Labs Lab 06/11/16 0503 06/12/16 0317 06/13/16 0346 06/14/16 0455 06/15/16 0400  INR 1.92 2.61 2.33 1.64 1.44     CBG:  Recent Labs Lab 06/14/16 1649 06/14/16 1955 06/15/16 0102  06/15/16 0427 06/15/16 0807  GLUCAP 105* 113* 102* 125* 116*     Recent Results (from the past 240 hour(s))  MRSA PCR Screening     Status: None   Collection Time: 06/08/16  9:27 PM  Result Value Ref Range Status   MRSA by PCR NEGATIVE NEGATIVE Final    Comment:        The GeneXpert MRSA Assay (FDA approved for NASAL specimens only), is one component of a comprehensive MRSA colonization surveillance program. It is not intended to diagnose MRSA infection nor to guide or monitor treatment for MRSA infections.       Radiology Studies: Dg Chest Port 1 View  Result Date: 06/13/2016 CLINICAL DATA:  Tachypnea with vomiting EXAM: PORTABLE CHEST 1 VIEW COMPARISON:  06/08/2016 FINDINGS: Elevation of the right diaphragm as before. Right lung is grossly clear. Streaky atelectasis versus mild infiltrate at the left lung base. Mild cardiomegaly. Atherosclerosis. No pneumothorax. IMPRESSION: Streaky atelectasis or infiltrate at the left base. Mild cardiomegaly. Electronically Signed   By: Jasmine Pang M.D.   On: 06/13/2016 21:25   Dg Abd Portable 1v  Result Date: 06/13/2016 CLINICAL DATA:  Vomiting today EXAM: PORTABLE ABDOMEN - 1 VIEW COMPARISON:  02/08/2016 FINDINGS: Streaky atelectasis at the left lung base. Surgical clips in the right upper quadrant. Gastro jejunostomy tube is in place. Surgical suture in the central abdomen. Nonobstructed gas pattern.  Mild stool in the rectum. IMPRESSION: Postsurgical changes of the central abdomen. Overall nonobstructed gas pattern with small amount of air within nondilated colon in the right abdomen and scattered stool in the rectum. Electronically Signed   By: Jasmine Pang M.D.   On: 06/13/2016 21:27     Medications:  Scheduled: . amLODipine  10 mg Oral Daily  . amoxicillin-clavulanate  1 tablet Oral Q24H  . atorvastatin  10 mg Oral QHS  . cycloSPORINE  1 drop Both Eyes BID  . feeding supplement (NEPRO CARB STEADY)  237 mL Oral BID BM  .  feeding supplement (OSMOLITE 1.5 CAL)  1,000 mL Per Tube Q24H  . [START ON 06/16/2016] feeding supplement (PRO-STAT SUGAR FREE 64)  30 mL Oral Daily  . gabapentin  300 mg Oral QHS  . guaiFENesin  5 mL Oral Q6H  . insulin aspart  0-9 Units Subcutaneous Q4H  . isosorbide mononitrate  30 mg Oral Daily  .  latanoprost  1 drop Both Eyes QHS  . [START ON 06/16/2016] levothyroxine  137 mcg Oral QAC breakfast  . losartan  50 mg Oral Daily  . mouth rinse  15 mL Mouth Rinse BID  . ondansetron  4 mg Oral Q6H  . [START ON 06/16/2016] polyethylene glycol  17 g Oral Daily  . [START ON 06/16/2016] senna-docusate  1 tablet Oral Daily  . sertraline  100 mg Oral Daily  . [START ON 06/16/2016] vancomycin  1,000 mg Intravenous Q48H  . warfarin  5 mg Oral ONCE-1800   Continuous:  WGN:[id number], hydroxypropyl methylcellulose / hypromellose, ipratropium-albuterol, ondansetron, RESOURCE THICKENUP CLEAR  Assessment/Plan:  Principal Problem:   Symptomatic bradycardia Active Problems:   Diabetes mellitus type 2 in nonobese (HCC)   Hypothyroidism   Pneumonia   Chronic kidney insufficiency, stage 3 (moderate)   PEG (percutaneous endoscopic gastrostomy) status (HCC)   Calcium channel blocker adverse reaction   Elevated TSH    Pneumonia -Hospital acquired pneumonia versus aspiration, developed fever, leukocytosis and shortness of breath. -Has history of dysphagia and on dysphagia 2 diet with nectar thick liquids, getting tube feeding for gastric outlet obstruction. -Started on meropenem and vancomycin initially because of what is reported to be penicillins allergy. -Continue Mucinex and DuoNeb. -Patient was on Unasyn, although she is listed allergic to penicillin and she was okay with Zosyn before. -Antibiotics switched to Augmentin.  Symptomatic bradycardia in the setting of tachycardia bradycardia syndrome Patient was recently started on Cardizem and bisoprolol. She came in with symptomatic  bradycardia. Patient seen by cardiology. She was placed on dopamine as well as glucagon infusions. Heart rate has stabilized. She has been taken off of these infusions. Patient has refused pacemaker placement. Cardiology continues to follow.  -Avoid AV nodal slowing medication. Currently on amlodipine, Imdur and losartan.  Paroxysmal atrial fibrillation -This appears to be stable. Chads2 vascular score of 6. Continue with anticoagulation with warfarin. Pharmacy is managing. -INR is 1.6.  Diabetes mellitus type 2 Patient did have hyperglycemia as a result of glucagon. She was started on Lantus. She was also started on her tube feedings last night. Lantus dose was adjusted. CBGs have improved some. Did have another episode of hypoglycemia yesterday afternoon. Continue to monitor. Continue with sliding scale coverage. HbA1c back in February was 8.0.  Chronic diastolic congestive heart failure Systolic function has been noted to be normal. Currently appears to be well compensated.  Acute on Chronic kidney disease stage III Creatinine was 2 when she was admitted to the hospital. She is making urine. Creatinine has improved. Continue to monitor closely.  Essential hypertension Blood pressure remains poorly controlled. Dose of her neutropenia was increased. Does of ARB also increased today. Avoid AV nodal blocking agents. Plan was for the patient to be discharged. However, in the afternoon BP was noted to be 195. Patient was feeling a little swimmy headed. She will be given a dose of intravenous hydralazine. Imdur will be reinitiated. Cancel discharge for now.  History of COPD and asthma Stable.  History of gastric outlet obstruction, status post PEG tube She is on tube feedings during the nighttime. Is on full liquid diet during the daytime with nectar thick liquids.  Hypothyroidism Noted to have elevated TSH and mildly elevated free T4. Will not make any changes to her thyroid medication dose at  this time. Continue current dose of levothyroxine. Will recommend repeating her thyroid function tests as an outpatient in 3-4 weeks.  Normocytic Anemia Hemoglobin levels noted to be  fluctuating. She does appear to have chronic anemia. No overt bleeding. Continue to monitor.  History of stroke Stable. PT evaluation.  History of depression Continue sertraline.  Constipation Initiated laxatives.  Pressure ulcer Per nursing documentation Pressure Injury 06/10/16 Stage II - Partial thickness loss of dermis presenting as a shallow open ulcer with a red, pink wound bed without slough.  DVT Prophylaxis: On warfarin    Code Status: DO NOT RESUSCITATE  Family Communication: Discussed with the patient  Disposition Plan: Continue management as outlined above. Discharge cancelled.    LOS: 7 days   [facility] A  [facility] Pager [phone] 06/15/2016, 11:45 AM  If 7PM-7AM, please contact night-coverage at [email or web address], password [facility]

## 2016-06-15 NOTE — Progress Notes (Signed)
Contacted pharmacy to change medications to be administered via J- tube.  Odessa Morren, RN

## 2016-06-15 NOTE — Progress Notes (Signed)
Patient with no complaints or concerns during 7pm - 7am shift. Slept during the night.   Keyshawna Prouse, RN 

## 2016-06-15 NOTE — Progress Notes (Signed)
Nutrition Follow-up  DOCUMENTATION CODES:   Not applicable  INTERVENTION:   Provide Osmolite 1.5@ 56m/hr via J-tube for 12 hours overnight (2000 hr to 0800 hr) to provide 1170 kcal, 49 grams of protein, and 593 ml of water.   Provide 30 ml Pro-stat via J-tube daily, provides 100 kcal and 15 grams of protein  Provide Nepro Shake po BID, each supplement provides 425 kcal, 19 grams protein, and 173 ml of fluid.   NUTRITION DIAGNOSIS:   Inadequate oral intake related to altered GI function as evidenced by other (see comment) (Full Liquid diet).  GOAL:   Patient will meet greater than or equal to 90% of their needs  MONITOR:   TF tolerance, Skin, Weight trends, PO intake, Labs, I & O's  REASON FOR ASSESSMENT:   Consult Enteral/tube feeding initiation and management  ASSESSMENT:   83year old female with PMH of asthma, bradycardia, CAD, Diastolic HF, CKD stage III, COPD, DVT, GERD, HTN, DM type 2, gastric outlet obstruction s/p G-tube placement, and hyperlipidemia. Presents to ED 3/8 with complaints of syncope and weakness. Upon arrival EMS reported HR 30-40s but mentating well.   Met with pt in room today, pt reports that she is feeling much better today. Pt eating thickened clear liquid diet while RD was in room. Pt also reports drinking 1-2 Nepros per day. RD spoke to RN who reports that pt can not have thin fluids or chopped or ground meats. Pt tolerates nectar thick fluids well and pt is good about thickening liquids herself as needed. Pt had a couple episodes of vomiting on 3/13 and tube feeds were held for two days. Plan is to restart tube feeds tonight at goal rate. RD spoke to RN who feels that pt will tolerate tube feeds well at goal. Per chart, pt has lost 9lbs(6%) in one week. RD is unsure if this is related to fluid changes or increased energy needs as pt with PNA and stage II pressure injury. Pt has remained weight stable on her home regimen of tubes feeds. RD will  continue to monitor weights and make changes to tube feeds as necessary if pt continues to loose weight.    Medications reviewed and include: augmentin, insulin, synthroid, zofran, miralax, senokot, warfarin  Labs reviewed: BUN 35(H), creat 1.71(H), Ca 8.2(L) Wbc- 13.6(H), Hgb 9.3(L), Hct 27.8(L)  Diet Order:  Diet - low sodium heart healthy Diet full liquid Room service appropriate? Yes; Fluid consistency: Nectar Thick  Skin:  Reviewed, no issues  Last BM:  3/14  Height:   Ht Readings from Last 1 Encounters:  06/10/16 '5\' 6"'  (1.676 m)    Weight:   Wt Readings from Last 1 Encounters:  06/15/16 133 lb (60.3 kg)    Ideal Body Weight:  59 kg  BMI:  Body mass index is 21.47 kg/m.  Estimated Nutritional Needs:   Kcal:  1450-1650  Protein:  75-90 grams  Fluid:  1.5 L/day  EDUCATION NEEDS:   No education needs identified at this time  CKoleen Distance RD, LSelbyvillePager #- [phone]

## 2016-06-16 ENCOUNTER — Other Ambulatory Visit: Payer: Self-pay | Admitting: Cardiology

## 2016-06-16 DIAGNOSIS — J690 Pneumonitis due to inhalation of food and vomit: Secondary | ICD-10-CM

## 2016-06-16 LAB — GLUCOSE, CAPILLARY
Glucose-Capillary: 172 mg/dL — ABNORMAL HIGH (ref 65–99)
Glucose-Capillary: 174 mg/dL — ABNORMAL HIGH (ref 65–99)
Glucose-Capillary: 143 mg/dL — ABNORMAL HIGH (ref 65–99)
Glucose-Capillary: 137 mg/dL — ABNORMAL HIGH (ref 65–99)

## 2016-06-16 LAB — CBC
HCT: 28.4 % — ABNORMAL LOW (ref 36.0–46.0)
Hemoglobin: 9.6 g/dL — ABNORMAL LOW (ref 12.0–15.0)
MCH: 28.9 pg (ref 26.0–34.0)
MCHC: 33.8 g/dL (ref 30.0–36.0)
MCV: 85.5 fL (ref 78.0–100.0)
Platelets: 149 10*3/uL — ABNORMAL LOW (ref 150–400)
RBC: 3.32 MIL/uL — ABNORMAL LOW (ref 3.87–5.11)
RDW: 22.3 % — ABNORMAL HIGH (ref 11.5–15.5)
WBC: 8.7 10*3/uL (ref 4.0–10.5)

## 2016-06-16 LAB — PROTIME-INR
INR: 1.33
Prothrombin Time: 16.6 seconds — ABNORMAL HIGH (ref 11.4–15.2)

## 2016-06-16 MED ORDER — AMOXICILLIN-POT CLAVULANATE 500-125 MG PO TABS: 1.0000 | ORAL_TABLET | Freq: Two times a day (BID) | ORAL | 0 refills | Status: DC

## 2016-06-16 MED ORDER — WARFARIN - PHARMACIST DOSING INPATIENT: Freq: Every day | Status: DC

## 2016-06-16 MED ORDER — WARFARIN SODIUM 5 MG PO TABS: 5.0000 mg | ORAL_TABLET | Freq: Once | ORAL | Status: DC

## 2016-06-16 NOTE — Progress Notes (Signed)
Call to pts daughter to notify her of prescriptions from unsuccessful discharge 4 days ago were left in the chart.   Verified pharmacy with pt's daughter. Faxed prescriptions to pharmacy for patient and daughter to pick up.

## 2016-06-16 NOTE — Consult Note (Signed)
[facility] CM Inpatient Consult   06/16/2016  Laurabeth Dayan 1923-08-21 [id number]  Follow up:  Spoke with Dr. Arthor Captain regarding ongoing barriers with community follow up with the patient.  Daughter was allowing follow up EMMI calls but declined Community follow up, as verbalized to this Clinical research associate, that home health was enough. Also, that the daughter has cancelled Palliative Care follow up in the past recent months due to a billing issue. [facility] Care Management plan to continue to follow up through [facility] HF follow up calls at this point. For quesitons, please contact:  Charlesetta Shanks, RN BSN CCM [facility]  [phone] business mobile phone Toll free office [phone]

## 2016-06-16 NOTE — Discharge Summary (Signed)
Physician Discharge Summary  Shannon [name] WUJ:[id number] DOB: 1923-05-20 DOA: 06/08/2016  PCP: Kirt Boys, DO  Admit date: 06/08/2016 Discharge date: 06/16/2016  Admitted From: Home Disposition: Home  Recommendations for Outpatient Follow-up:  1. Follow up with PCP in 1-2 weeks 2. Please obtain BMP/CBC in one week  Home Health: NA Equipment/Devices:NA  Discharge Condition: Stable CODE STATUS: DNR Diet recommendation: Diet full liquid, Fluid consistency: Nectar Thick  Brief/Interim Summary: 83 year old female with PMH of asthma, bradycardia, CAD, Diastolic HF, CKD stage III, COPD, DVT, GERD, HTN, DM type 2, gastric outlet obstruction s/p G-tube placement, and hyperlipidemia. Presented to ED 3/8 with complaints of syncope and weakness. Upon arrival EMS reported HR 30-40s but mentating well. Cardiology was consulted. Recently patient was started on Cardizem and Bisoprolol. Daughter reported that patient has been taking her medications as prescribed since last admission. In ED she was on EPI gtt and receiving dose of calcium gluconate. Patient was admitted to the intensive care unit after being initiated on dopamine infusion as well as glucagon infusion. She was stabilized and then transferred to stepdown unit.  Discharge Diagnoses:  Principal Problem:   Symptomatic bradycardia Active Problems:   Diabetes mellitus type 2 in nonobese (HCC)   Hypothyroidism   Pneumonia   Chronic kidney insufficiency, stage 3 (moderate)   PEG (percutaneous endoscopic gastrostomy) status (HCC)   Calcium channel blocker adverse reaction   Elevated TSH   Pneumonia -Hospital acquired pneumonia versus aspiration, developed fever 101.6, leukocytosis and shortness of breath. -Has history of dysphagia and on dysphagia 2 diet with nectar thick liquids, getting tube feeding for gastric outlet obstruction. -Started on meropenem and vancomycin initially because of what is reported to be penicillins  allergy. -Continue Mucinex and DuoNeb. -Patient was on Unasyn, although she is listed allergic to penicillin and she was okay with Zosyn before. -Antibiotics switched to Augmentin, discharged on Augmentin for 4 more days.  Symptomatic bradycardia in the setting of tachycardia bradycardia syndrome -Patient was recently started on Cardizem and bisoprolol. She came in with symptomatic bradycardia.  -Seen by cardiology. She was placed on dopamine as well as glucagon infusions. Heart rate has stabilized.  -She has been taken off of these infusions. Patient has refused pacemaker placement.  -Avoid AV nodal slowing medication. Currently on amlodipine, Imdur and losartan.  Paroxysmal atrial fibrillation -This appears to be stable. Chads2 vascular score of 6. Continue with anticoagulation with warfarin. Pharmacy is managing. -INR is 1.3 on discharge, home RN to check INR on Monday 06/19/2016  Diabetes mellitus type 2 Patient did have hyperglycemia as a result of glucagon. She was started on Lantus. She was also started on her tube feedings last night. Lantus dose was adjusted. CBGs have improved some. Did have another episode of hypoglycemia yesterday afternoon. Continue to monitor. Continue with sliding scale coverage. HbA1c back in February was 8.0.  Chronic diastolic congestive heart failure Systolic function has been noted to be normal. Currently appears to be well compensated.  Acute on Chronic kidney disease stage III Creatinine was 2 when she was admitted to the hospital. She is making urine. Creatinine has improved. Continue to monitor closely.  Essential hypertension Blood pressure remains poorly controlled. Dose of her neutropenia was increased. Does of ARB also increased today. Avoid AV nodal blocking agents. Plan was for the patient to be discharged. However, in the afternoon BP was noted to be 195. Patient was feeling a little swimmy headed. She will be given a dose of intravenous  hydralazine. Imdur  will be reinitiated. Cancel discharge for now.  History of COPD and asthma Stable.  History of gastric outlet obstruction, status post PEG tube She is on tube feedings during the nighttime. Is on full liquid diet during the daytime with nectar thick liquids.  Hypothyroidism Noted to have elevated TSH and mildly elevated free T4. Will not make any changes to her thyroid medication dose at this time. Continue current dose of levothyroxine. Will recommend repeating her thyroid function tests as an outpatient in 3-4 weeks.  Normocytic Anemia Hemoglobin levels noted to be fluctuating. She does appear to have chronic anemia. No overt bleeding. Continue to monitor.  History of stroke Stable. PT evaluation.  History of depression Continue sertraline.  Constipation Initiated laxatives.  Pressure ulcer Per nursing documentation Pressure Injury 06/10/16 Stage II - Partial thickness loss of dermis presenting as a shallow open ulcer with a red, pink wound bed without slough.  Goals of care -Patient has history of recurrent admission to the hospital, admitted 6 times in the past 6 months. -Per CM she had palliative care in the past. -Currently DNR/DNI, needs to follow-up closely with primary care physician.   Discharge Instructions  Discharge Instructions    Call MD for:  difficulty breathing, headache or visual disturbances    Complete by:  As directed    Call MD for:  extreme fatigue    Complete by:  As directed    Call MD for:  persistant dizziness or light-headedness    Complete by:  As directed    Call MD for:  persistant nausea and vomiting    Complete by:  As directed    Call MD for:  severe uncontrolled pain    Complete by:  As directed    Call MD for:  temperature >100.4    Complete by:  As directed    Diet - low sodium heart healthy    Complete by:  As directed    Diet - low sodium heart healthy    Complete by:  As directed    Discharge  instructions    Complete by:  As directed    Please take your medications as prescribed. Please note changes to medications. Blood pressure will likely take a few days to achieve reasonable control. Home health nurse will check your INR level on Friday. You have been made an appointment with cardiology.  You were cared for by a hospitalist during your hospital stay. If you have any questions about your discharge medications or the care you received while you were in the hospital after you are discharged, you can call the unit and asked to speak with the hospitalist on call if the hospitalist that took care of you is not available. Once you are discharged, your primary care physician will handle any further medical issues. Please note that NO REFILLS for any discharge medications will be authorized once you are discharged, as it is imperative that you return to your primary care physician (or establish a relationship with a primary care physician if you do not have one) for your aftercare needs so that they can reassess your need for medications and monitor your lab values. If you do not have a primary care physician, you can call [phone] for a physician referral.   Increase activity slowly    Complete by:  As directed    Increase activity slowly    Complete by:  As directed      Allergies as of 06/16/2016  Reactions   Penicillins Itching, Rash, Other (See Comments)   Tolerated unasyn, zosyn & cephalosporins in the past.  Has patient had a PCN reaction causing immediate rash, facial/tongue/throat swelling, SOB or lightheadedness with hypotension: Yes Has patient had a PCN reaction causing severe rash involving mucus membranes or skin necrosis: Unk Has patient had a PCN reaction that required hospitalization: Unk Has patient had a PCN reaction occurring within the last 10 years: Unk If all of the above answers are "NO", then may proceed with Cephalosporins      Medication List    STOP  taking these medications   bisoprolol 5 MG tablet Commonly known as:  ZEBETA   diltiazem 120 MG 24 hr capsule Commonly known as:  CARDIZEM CD   hydrALAZINE 50 MG tablet Commonly known as:  APRESOLINE   isosorbide mononitrate 30 MG 24 hr tablet Commonly known as:  IMDUR     TAKE these medications   acetaminophen 325 MG tablet Commonly known as:  TYLENOL Take 650 mg by mouth 2 (two) times daily as needed for mild pain.   AMINO ACIDS-PROTEIN HYDROLYS PO Take 30 mLs by mouth 2 (two) times daily.   amLODipine 10 MG tablet Commonly known as:  NORVASC Take 1 tablet (10 mg total) by mouth daily.   amoxicillin-clavulanate 500-125 MG tablet Commonly known as:  AUGMENTIN Take 1 tablet (500 mg total) by mouth 2 (two) times daily.   atorvastatin 10 MG tablet Commonly known as:  LIPITOR TAKE ONE TABLET BY MOUTH ONCE DAILY   b complex vitamins tablet Take 1 tablet by mouth daily.   bacitracin-polymyxin b ophthalmic ointment Commonly known as:  POLYSPORIN Place 1 application into both eyes 2 (two) times daily. apply to eye every 12 hours while awake   bisacodyl 10 MG suppository Commonly known as:  DULCOLAX Place 1 suppository (10 mg total) rectally daily as needed for moderate constipation.   calcium-vitamin D 500-200 MG-UNIT tablet Commonly known as:  OSCAL WITH D Take 1 tablet by mouth daily with breakfast.   chlorhexidine 0.12 % solution Commonly known as:  PERIDEX Use as directed 15 mLs in the mouth or throat 2 (two) times daily.   feeding supplement (GLUCERNA SHAKE) Liqd Take 237 mLs by mouth daily. What changed:  Another medication with the same name was changed. Make sure you understand how and when to take each.   feeding supplement (OSMOLITE 1.5 CAL) Liqd Place 1,000 mLs into feeding tube daily. What changed:  how much to take   fluticasone 50 MCG/ACT nasal spray Commonly known as:  FLONASE Place 2 sprays into both nostrils daily.   free water Soln Place  230 mLs into feeding tube 3 (three) times daily.   furosemide 20 MG tablet Commonly known as:  LASIX Take 1 tablet (20 mg total) by mouth daily. What changed:  medication strength  how much to take   gabapentin 300 MG capsule Commonly known as:  NEURONTIN Take 1 capsule (300 mg total) by mouth at bedtime.   insulin aspart 100 UNIT/ML FlexPen Commonly known as:  NOVOLOG FLEXPEN Insulin sliding scale: Blood sugar  120-150   3units                       151-200   4units                       201-250   7units  251- 300  11units                       301-350   15uints                       351-400   20units                       >400         call MD immediately   Insulin Glargine 100 UNIT/ML Solostar Pen Commonly known as:  LANTUS SOLOSTAR Inject 5 Units into the skin daily at 10 pm.   ipratropium-albuterol 0.5-2.5 (3) MG/3ML Soln Commonly known as:  DUONEB Take 3 mLs by nebulization every 6 (six) hours as needed (sob). Use 3 times daily x 4 days then every 6 hours as needed. What changed:  additional instructions   latanoprost 0.005 % ophthalmic solution Commonly known as:  XALATAN Place 1 drop into both eyes at bedtime.   levalbuterol 45 MCG/ACT inhaler Commonly known as:  XOPENEX HFA Inhale 2 puffs into the lungs every 6 (six) hours as needed for wheezing or shortness of breath.   levothyroxine 137 MCG tablet Commonly known as:  SYNTHROID, LEVOTHROID TAKE 1 TABLET (137 MCG TOTAL) BY MOUTH DAILY BEFORE BREAKFAST.   losartan 50 MG tablet Commonly known as:  COZAAR Take 1 tablet (50 mg total) by mouth daily.   multivitamin with minerals Tabs tablet Take 1 tablet by mouth daily.   Polyethyl Glycol-Propyl Glycol 0.4-0.3 % Soln Place 1 drop into both eyes 4 (four) times daily.   polyethylene glycol packet Commonly known as:  MIRALAX / GLYCOLAX Take 17 g by mouth daily as needed.   RESOURCE THICKENUP CLEAR Powd Take 120 g by mouth as needed (use to  thicken liquids.).   RESTASIS 0.05 % ophthalmic emulsion Generic drug:  cycloSPORINE USE 1 DROP INTO BOTH EYES TWICE DAILY   sertraline 100 MG tablet Commonly known as:  ZOLOFT TAKE ONE TABLET BY MOUTH ONCE DAILY FOR DEPRESSION   SINUS CONGESTION/PAIN DAY/NGHT PO Take 1 capsule by mouth daily.   warfarin 2.5 MG tablet Commonly known as:  COUMADIN Take as directed by [facility]. What changed:  how much to take  how to take this  when to take this  additional instructions      Follow-up Information    [facility] Follow up.   Why:  Resume HHRN, PT, OT, aide, social worker; INR to be drawn 06/19/2016 Contact information: [address] [place] [address] [phone]        Jacolyn Reedy, PA-C Follow up on 06/27/2016.   Specialty:  Cardiology Why:  9:45AM on 06/27/16 Contact information: [address] [place] [address] [phone]          Allergies  Allergen Reactions  . Penicillins Itching, Rash and Other (See Comments)    Tolerated unasyn, zosyn & cephalosporins in the past.  Has patient had a PCN reaction causing immediate rash, facial/tongue/throat swelling, SOB or lightheadedness with hypotension: Yes Has patient had a PCN reaction causing severe rash involving mucus membranes or skin necrosis: Unk Has patient had a PCN reaction that required hospitalization: Unk Has patient had a PCN reaction occurring within the last 10 years: Unk If all of the above answers are "NO", then may proceed with Cephalosporins    Consultations:  None   Procedures None  Radiological studies:  Dg Chest 2 View  Result Date: 05/29/2016 CLINICAL DATA:  Pneumonia EXAM: CHEST  2 VIEW COMPARISON:  CT T 07/21/2016.  Plain film 05/22/2016 FINDINGS: Left upper lobe collapse again suspected as seen on prior chest CT, likely not significantly changed. Right lung is clear. Heart is borderline in size. No effusions or acute bony  abnormality. IMPRESSION: Probable continued left upper lobe collapse. Mild cardiomegaly. Electronically Signed   By: Charlett Nose M.D.   On: 05/29/2016 13:24   Ct Chest Wo Contrast  Result Date: 05/28/2016 CLINICAL DATA:  Follow-up pneumonia. EXAM: CT CHEST WITHOUT CONTRAST TECHNIQUE: Multidetector CT imaging of the chest was performed following the standard protocol without IV contrast. COMPARISON:  05/22/2016 and prior radiographs.  07/07/2015 chest CT. FINDINGS: Cardiovascular: Cardiomegaly and coronary artery calcifications again noted. Thoracic aortic atherosclerotic calcifications again identified without aneurysm. No pericardial effusion identified. Mediastinum/Nodes: No mediastinal mass or enlarged lymph nodes. Lungs/Pleura: Left upper lobe collapse is identified without definite obstructing cause. There are several calcifications within the collapsed left upper lobe. Central ground-glass opacities within both lungs are noted and nonspecific. This may represent edema, nonspecific inflammation or infection. Mucous filling several right lower lobe bronchi are noted. Upper Abdomen: An NG tube in the stomach is noted. No acute abnormalities identified. Musculoskeletal: No acute or suspicious abnormality. A T11 compression fracture is unchanged. IMPRESSION: Left upper lobe collapse without identifiable cause on this study. Broncholiths within the collapsed left upper lobe identified. Nonspecific bilateral central ground-glass opacities -question edema, infection or nonspecific inflammation. Cardiomegaly, coronary artery disease and thoracic atherosclerotic calcifications. Electronically Signed   By: Harmon Pier M.D.   On: 05/28/2016 07:20   Dg Chest Port 1 View  Result Date: 06/13/2016 CLINICAL DATA:  Tachypnea with vomiting EXAM: PORTABLE CHEST 1 VIEW COMPARISON:  06/08/2016 FINDINGS: Elevation of the right diaphragm as before. Right lung is grossly clear. Streaky atelectasis versus mild infiltrate at  the left lung base. Mild cardiomegaly. Atherosclerosis. No pneumothorax. IMPRESSION: Streaky atelectasis or infiltrate at the left base. Mild cardiomegaly. Electronically Signed   By: Jasmine Pang M.D.   On: 06/13/2016 21:25   Dg Chest Portable 1 View  Result Date: 06/08/2016 CLINICAL DATA:  Bradycardia EXAM: PORTABLE CHEST 1 VIEW COMPARISON:  Chest x-ray of 05/29/2016 FINDINGS: Aeration of the left upper lobe appears to have improved. There is mild left basilar atelectasis present. Mediastinal and hilar contours are unremarkable. Mild cardiomegaly is stable. No bony abnormality is seen other than degenerative change particularly in the left shoulder. IMPRESSION: Improved aeration of the left upper lobe. Mild left basilar atelectasis. Electronically Signed   By: Dwyane Dee M.D.   On: 06/08/2016 16:39   Dg Chest Port 1 View  Result Date: 05/22/2016 CLINICAL DATA:  83 year old female with shortness of breath. Respiratory distress. Recently hospitalized for pneumonia. EXAM: PORTABLE CHEST 1 VIEW COMPARISON:  Chest radiograph 05/11/2016 FINDINGS: There has been interval improvement of the previously seen left lung base airspace opacities. Minimal residual density at the left lung base may represent atelectasis versus residual infiltrate. There is a small left pleural effusion. The right lung is clear. There is no pneumothorax. The cardiac silhouette is within normal limits. There is atherosclerotic calcification of the aortic arch. No acute osseous pathology. IMPRESSION: Interval improvement of the left lung base opacity with near complete resolution. Small left pleural effusion with minimal left lung base atelectasis versus residual infiltrate. Clinical correlation and follow-up recommended. Electronically Signed   By: Elgie Collard M.D.   On: 05/22/2016  01:40   Dg Abd Portable 1v  Result Date: 06/13/2016 CLINICAL DATA:  Vomiting today EXAM: PORTABLE ABDOMEN - 1 VIEW COMPARISON:  02/08/2016 FINDINGS:  Streaky atelectasis at the left lung base. Surgical clips in the right upper quadrant. Gastro jejunostomy tube is in place. Surgical suture in the central abdomen. Nonobstructed gas pattern.  Mild stool in the rectum. IMPRESSION: Postsurgical changes of the central abdomen. Overall nonobstructed gas pattern with small amount of air within nondilated colon in the right abdomen and scattered stool in the rectum. Electronically Signed   By: Jasmine Pang M.D.   On: 06/13/2016 21:27     Subjective:  Discharge Exam: Vitals:   06/15/16 0427 06/15/16 1235 06/15/16 1943 06/16/16 0504  BP: (!) 118/59 (!) 140/51 (!) 133/54 (!) 124/50  Pulse: 66 63 63 (!) 58  Resp:  18 16   Temp: 98 F (36.7 C) 97.8 F (36.6 C) 98.7 F (37.1 C) 98.4 F (36.9 C)  TempSrc: Oral Oral Oral Oral  SpO2: 98% 100% 98% 96%  Weight: 60.3 kg (133 lb)   60.4 kg (133 lb 1.6 oz)  Height:       General: Pt is alert, awake, not in acute distress Cardiovascular: RRR, S1/S2 +, no rubs, no gallops Respiratory: CTA bilaterally, no wheezing, no rhonchi Abdominal: Soft, NT, ND, bowel sounds + Extremities: no edema, no cyanosis   The results of significant diagnostics from this hospitalization (including imaging, microbiology, ancillary and laboratory) are listed below for reference.    Microbiology: Recent Results (from the past 240 hour(s))  MRSA PCR Screening     Status: None   Collection Time: 06/08/16  9:27 PM  Result Value Ref Range Status   MRSA by PCR NEGATIVE NEGATIVE Final    Comment:        The GeneXpert MRSA Assay (FDA approved for NASAL specimens only), is one component of a comprehensive MRSA colonization surveillance program. It is not intended to diagnose MRSA infection nor to guide or monitor treatment for MRSA infections.   Culture, blood (Routine X 2) w Reflex to ID Panel     Status: None (Preliminary result)   Collection Time: 06/14/16 10:15 AM  Result Value Ref Range Status   Specimen  Description BLOOD LEFT ARM  Final   Special Requests IN PEDIATRIC BOTTLE 2CC  Final   Culture NO GROWTH 1 DAY  Final   Report Status PENDING  Incomplete  Culture, blood (Routine X 2) w Reflex to ID Panel     Status: None (Preliminary result)   Collection Time: 06/14/16 10:30 AM  Result Value Ref Range Status   Specimen Description BLOOD LEFT HAND  Final   Special Requests IN PEDIATRIC BOTTLE 2CC  Final   Culture NO GROWTH 1 DAY  Final   Report Status PENDING  Incomplete     Labs: BNP (last 3 results)  Recent Labs  05/08/16 1635 05/22/16 0102 06/08/16 1523  BNP 80.9 271.0* 1,268.6*   Basic Metabolic Panel:  Recent Labs Lab 06/10/16 0317 06/11/16 0503 06/12/16 0317 06/13/16 0346 06/15/16 0400  NA 134* 137 137 138 136  K 3.5 4.3 4.3 4.3 4.7  CL 100* 105 106 106 108  CO2 24 25 24 22  20*  GLUCOSE 142* 265* 183* 166* 110*  BUN 52* 41* 35* 32* 35*  CREATININE 1.30* 1.30* 1.26* 1.15* 1.71*  CALCIUM 8.7* 8.8* 8.7* 8.8* 8.2*  MG 2.1  --   --   --   --   PHOS 3.3  --   --   --   --  Liver Function Tests: No results for input(s): AST, ALT, ALKPHOS, BILITOT, PROT, ALBUMIN in the last 168 hours. No results for input(s): LIPASE, AMYLASE in the last 168 hours. No results for input(s): AMMONIA in the last 168 hours. CBC:  Recent Labs Lab 06/13/16 0346 06/13/16 2215 06/14/16 0455 06/15/16 0400 06/16/16 0632  WBC 5.6 8.3 17.2* 13.6* 8.7  NEUTROABS  --  8.0*  --   --   --   HGB 10.1* 10.8* 10.3* 9.3* 9.6*  HCT 30.3* 32.1* 30.6* 27.8* 28.4*  MCV 84.9 84.9 84.8 84.8 85.5  PLT 164 130* 137* 123* 149*   Cardiac Enzymes: No results for input(s): CKTOTAL, CKMB, CKMBINDEX, TROPONINI in the last 168 hours. BNP: Invalid input(s): POCBNP CBG:  Recent Labs Lab 06/15/16 1656 06/15/16 1941 06/16/16 0125 06/16/16 0443 06/16/16 0752  GLUCAP 126* 130* 172* 174* 143*   D-Dimer No results for input(s): DDIMER in the last 72 hours. Hgb A1c No results for input(s): HGBA1C  in the last 72 hours. Lipid Profile No results for input(s): CHOL, HDL, LDLCALC, TRIG, CHOLHDL, LDLDIRECT in the last 72 hours. Thyroid function studies No results for input(s): TSH, T4TOTAL, T3FREE, THYROIDAB in the last 72 hours.  Invalid input(s): FREET3 Anemia work up No results for input(s): VITAMINB12, FOLATE, FERRITIN, TIBC, IRON, RETICCTPCT in the last 72 hours. Urinalysis    Component Value Date/Time   COLORURINE YELLOW 06/08/2016 1647   APPEARANCEUR CLEAR 06/08/2016 1647   LABSPEC 1.006 06/08/2016 1647   PHURINE 8.0 06/08/2016 1647   GLUCOSEU NEGATIVE 06/08/2016 1647   HGBUR NEGATIVE 06/08/2016 1647   BILIRUBINUR NEGATIVE 06/08/2016 1647   KETONESUR NEGATIVE 06/08/2016 1647   PROTEINUR NEGATIVE 06/08/2016 1647   UROBILINOGEN 0.2 01/27/2015 2308   NITRITE NEGATIVE 06/08/2016 1647   LEUKOCYTESUR NEGATIVE 06/08/2016 1647   Sepsis Labs Invalid input(s): PROCALCITONIN,  WBC,  LACTICIDVEN Microbiology Recent Results (from the past 240 hour(s))  MRSA PCR Screening     Status: None   Collection Time: 06/08/16  9:27 PM  Result Value Ref Range Status   MRSA by PCR NEGATIVE NEGATIVE Final    Comment:        The GeneXpert MRSA Assay (FDA approved for NASAL specimens only), is one component of a comprehensive MRSA colonization surveillance program. It is not intended to diagnose MRSA infection nor to guide or monitor treatment for MRSA infections.   Culture, blood (Routine X 2) w Reflex to ID Panel     Status: None (Preliminary result)   Collection Time: 06/14/16 10:15 AM  Result Value Ref Range Status   Specimen Description BLOOD LEFT ARM  Final   Special Requests IN PEDIATRIC BOTTLE 2CC  Final   Culture NO GROWTH 1 DAY  Final   Report Status PENDING  Incomplete  Culture, blood (Routine X 2) w Reflex to ID Panel     Status: None (Preliminary result)   Collection Time: 06/14/16 10:30 AM  Result Value Ref Range Status   Specimen Description BLOOD LEFT HAND  Final    Special Requests IN PEDIATRIC BOTTLE 2CC  Final   Culture NO GROWTH 1 DAY  Final   Report Status PENDING  Incomplete     Time coordinating discharge: Over 30 minutes  SIGNED:   Clint Lipps, MD  [facility] 06/16/2016, 10:58 AM Pager   If 7PM-7AM, please contact night-coverage [email or web address] Password [email or web address]

## 2016-06-16 NOTE — Progress Notes (Signed)
Physical Therapy Treatment Patient Details Name: Shannon [name] MRN: [id number] DOB: [birth year] Today's Date: 06/16/2016    History of Present Illness Patient is a 83 yo female admitted 06/08/16 with near syncope due to symptomatic bradycardia.    PMH:  hypertension, dyslipidemia, CVA, COPD and asthma, chronic atrial fibrillation, diastolic congestive heart failure, tachy/bracy syndrome, CKD, CAD, chronic neck pain, diabetes, diabetic neuropathy, anemia, hypothyroidism, gastric outlet obstruction with prior Gtube.    PT Comments    Assisted OOB to [facility] to void.  Assisted with hygiene then amb a greater distance in hallway.  Pt feeling good and eager to D/C to home.   Follow Up Recommendations  Home health PT;Supervision - Intermittent     Equipment Recommendations  None recommended by PT    Recommendations for Other Services       Precautions / Restrictions Precautions Precautions: Fall Restrictions Weight Bearing Restrictions: No    Mobility  Bed Mobility Overal bed mobility: Needs Assistance Bed Mobility: Supine to Sit;Sit to Supine     Supine to sit: Modified independent (Device/Increase time)     General bed mobility comments: increased time, use of bed rails  Transfers Overall transfer level: Needs assistance Equipment used: Rolling walker (2 wheeled) Transfers: Sit to/from [employer] Sit to Stand: Supervision Stand pivot transfers: Supervision       General transfer comment: Supervision for safety.  assisted from bed to [facility] to void with one VC safety with turn completion  Ambulation/Gait Ambulation/Gait assistance: Supervision Ambulation Distance (Feet): 230 Feet Assistive device: Rolling walker (2 wheeled) Gait Pattern/deviations: Step-through pattern;Trunk flexed;Decreased stride length Gait velocity: decreased   General Gait Details: tolerated well   Stairs            Wheelchair Mobility    Modified Rankin (Stroke Patients  Only)       Balance                                    Cognition Arousal/Alertness: Awake/alert Behavior During Therapy: WFL for tasks assessed/performed Overall Cognitive Status: Within Functional Limits for tasks assessed                      Exercises      General Comments        Pertinent Vitals/Pain Pain Assessment: No/denies pain    Home Living                      Prior Function            PT Goals (current goals can now be found in the care plan section) Progress towards PT goals: Progressing toward goals    Frequency    Min 3X/week      PT Plan Current plan remains appropriate    Co-evaluation             End of Session Equipment Utilized During Treatment: Gait belt Activity Tolerance: Patient tolerated treatment well Patient left: in chair;with call bell/phone within reach Nurse Communication: Mobility status PT Visit Diagnosis: Difficulty in walking, not elsewhere classified (R26.2);Muscle weakness (generalized) (M62.81)     Time: 1000-1026 PT Time Calculation (min) (ACUTE ONLY): 26 min  Charges:  $Gait Training: 8-22 mins $Therapeutic Activity: 8-22 mins                    G Codes:  Shannon [name]  PTA [facility]  Acute  Rehab Pager      [phone]

## 2016-06-16 NOTE — Progress Notes (Signed)
[place] for Warfarin Indication: atrial fibrillation  Vital Signs: Temp: 98.4 F (36.9 C) (03/16 0504) Temp Source: Oral (03/16 0504) BP: 124/50 (03/16 0504) Pulse Rate: 58 (03/16 0504)  Assessment: 89 yoF on Coumadin PTA for Afib. INR down to 1.33 this morning, Didn't receive coumadin 3/13-3/14, has been giving higher than home dose in the past 2 days, CBC stable, No bleeding noted per chart.   PTA dose: 3.75mg  daily exc for 2.5mg  on Mon. Recently finished a course of doxy and Levaquin and supratherapeutic INR (5.6) on 3/5, held coumadin 3/5-3/7, INR 1.78 on admission on 3/8  Goal of Therapy:  INR 2-3 Monitor platelets by anticoagulation protocol: Yes   Plan:  Coumadin 5 today Daily INR Monitor CBC closely  Maryanna Shape, PharmD, BCPS  Clinical Pharmacist  Pager: [phone]  06/16/2016 8:47 AM

## 2016-06-17 LAB — BLOOD CULTURE ID PANEL (REFLEXED)
Acinetobacter baumannii: NOT DETECTED
Candida albicans: DETECTED — AB
Candida glabrata: NOT DETECTED
Candida krusei: NOT DETECTED
Candida parapsilosis: NOT DETECTED
Candida tropicalis: NOT DETECTED
Enterobacter cloacae complex: NOT DETECTED
Enterobacteriaceae species: NOT DETECTED
Enterococcus species: NOT DETECTED
Escherichia coli: NOT DETECTED
Haemophilus influenzae: NOT DETECTED
Klebsiella oxytoca: NOT DETECTED
Klebsiella pneumoniae: NOT DETECTED
Listeria monocytogenes: NOT DETECTED
Neisseria meningitidis: NOT DETECTED
Proteus species: NOT DETECTED
Pseudomonas aeruginosa: NOT DETECTED
Serratia marcescens: NOT DETECTED
Staphylococcus aureus (BCID): NOT DETECTED
Staphylococcus species: NOT DETECTED
Streptococcus agalactiae: NOT DETECTED
Streptococcus pneumoniae: NOT DETECTED
Streptococcus pyogenes: NOT DETECTED
Streptococcus species: NOT DETECTED

## 2016-06-18 ENCOUNTER — Emergency Department (HOSPITAL_COMMUNITY)
Admission: EM | Admit: 2016-06-18 | Discharge: 2016-06-18 | Disposition: A | Discharge status: Home / Self Care | Payer: Medicare Other | Attending: Emergency Medicine | Admitting: Emergency Medicine

## 2016-06-18 ENCOUNTER — Emergency Department (HOSPITAL_COMMUNITY): Payer: Medicare Other

## 2016-06-18 ENCOUNTER — Encounter (HOSPITAL_COMMUNITY): Payer: Self-pay | Admitting: Emergency Medicine

## 2016-06-18 DIAGNOSIS — Z7901 Long term (current) use of anticoagulants: Secondary | ICD-10-CM | POA: Diagnosis not present

## 2016-06-18 DIAGNOSIS — E1122 Type 2 diabetes mellitus with diabetic chronic kidney disease: Secondary | ICD-10-CM | POA: Insufficient documentation

## 2016-06-18 DIAGNOSIS — N183 Chronic kidney disease, stage 3 (moderate): Secondary | ICD-10-CM | POA: Insufficient documentation

## 2016-06-18 DIAGNOSIS — B379 Candidiasis, unspecified: Secondary | ICD-10-CM | POA: Diagnosis not present

## 2016-06-18 DIAGNOSIS — I130 Hypertensive heart and chronic kidney disease with heart failure and stage 1 through stage 4 chronic kidney disease, or unspecified chronic kidney disease: Secondary | ICD-10-CM | POA: Insufficient documentation

## 2016-06-18 DIAGNOSIS — I2510 Atherosclerotic heart disease of native coronary artery without angina pectoris: Secondary | ICD-10-CM | POA: Insufficient documentation

## 2016-06-18 DIAGNOSIS — R0602 Shortness of breath: Secondary | ICD-10-CM | POA: Diagnosis present

## 2016-06-18 DIAGNOSIS — R7989 Other specified abnormal findings of blood chemistry: Secondary | ICD-10-CM | POA: Diagnosis not present

## 2016-06-18 DIAGNOSIS — J449 Chronic obstructive pulmonary disease, unspecified: Secondary | ICD-10-CM | POA: Diagnosis not present

## 2016-06-18 DIAGNOSIS — I5033 Acute on chronic diastolic (congestive) heart failure: Secondary | ICD-10-CM | POA: Diagnosis not present

## 2016-06-18 DIAGNOSIS — B9689 Other specified bacterial agents as the cause of diseases classified elsewhere: Secondary | ICD-10-CM | POA: Diagnosis not present

## 2016-06-18 DIAGNOSIS — Z8673 Personal history of transient ischemic attack (TIA), and cerebral infarction without residual deficits: Secondary | ICD-10-CM | POA: Diagnosis not present

## 2016-06-18 DIAGNOSIS — Z794 Long term (current) use of insulin: Secondary | ICD-10-CM | POA: Diagnosis not present

## 2016-06-18 DIAGNOSIS — B377 Candidal sepsis: Secondary | ICD-10-CM | POA: Diagnosis not present

## 2016-06-18 LAB — COMPREHENSIVE METABOLIC PANEL
ALT: 25 U/L (ref 14–54)
AST: 21 U/L (ref 15–41)
Albumin: 3.1 g/dL — ABNORMAL LOW (ref 3.5–5.0)
Alkaline Phosphatase: 71 U/L (ref 38–126)
Anion gap: 7 (ref 5–15)
BUN: 35 mg/dL — ABNORMAL HIGH (ref 6–20)
CO2: 23 mmol/L (ref 22–32)
Calcium: 9.4 mg/dL (ref 8.9–10.3)
Chloride: 106 mmol/L (ref 101–111)
Creatinine, Ser: 1.41 mg/dL — ABNORMAL HIGH (ref 0.44–1.00)
GFR calc Af Amer: 37 mL/min — ABNORMAL LOW (ref >60)
GFR calc non Af Amer: 32 mL/min — ABNORMAL LOW (ref >60)
Glucose, Bld: 97 mg/dL (ref 65–99)
Potassium: 4.7 mmol/L (ref 3.5–5.1)
Sodium: 136 mmol/L (ref 135–145)
Total Bilirubin: 0.4 mg/dL (ref 0.3–1.2)
Total Protein: 6.6 g/dL (ref 6.5–8.1)

## 2016-06-18 LAB — CBC WITH DIFFERENTIAL/PLATELET
Basophils Absolute: 0.1 10*3/uL (ref 0.0–0.1)
Basophils Relative: 1 %
Eosinophils Absolute: 0.2 10*3/uL (ref 0.0–0.7)
Eosinophils Relative: 3 %
HCT: 31 % — ABNORMAL LOW (ref 36.0–46.0)
Hemoglobin: 10.4 g/dL — ABNORMAL LOW (ref 12.0–15.0)
Lymphocytes Relative: 24 %
Lymphs Abs: 1.8 10*3/uL (ref 0.7–4.0)
MCH: 28.6 pg (ref 26.0–34.0)
MCHC: 33.5 g/dL (ref 30.0–36.0)
MCV: 85.2 fL (ref 78.0–100.0)
Monocytes Absolute: 1.1 10*3/uL — ABNORMAL HIGH (ref 0.1–1.0)
Monocytes Relative: 15 %
Neutro Abs: 4.3 10*3/uL (ref 1.7–7.7)
Neutrophils Relative %: 57 %
Platelets: 238 10*3/uL (ref 150–400)
RBC: 3.64 MIL/uL — ABNORMAL LOW (ref 3.87–5.11)
RDW: 21.9 % — ABNORMAL HIGH (ref 11.5–15.5)
WBC: 7.5 10*3/uL (ref 4.0–10.5)

## 2016-06-18 MED ORDER — FLUCONAZOLE 100 MG PO TABS
200.0000 mg | ORAL_TABLET | Freq: Once | ORAL | Status: AC
  Administered 2016-06-18: 200 mg via ORAL
  Filled 2016-06-18: qty 2

## 2016-06-18 MED ORDER — FLUCONAZOLE 200 MG PO TABS: 200.0000 mg | ORAL_TABLET | Freq: Every day | ORAL | 0 refills | Status: DC

## 2016-06-18 NOTE — ED Provider Notes (Signed)
[place] DEPT Provider Note   CSN: [id number] Arrival date & time: 06/18/16  1409     History   Chief Complaint Chief Complaint  Patient presents with  . IV Medication    "yeast in blood"    HPI Shannon [name] is a 83 y.o. female.  HPI Patient was recently admitted to the ICU with symptomatically bradycardia. Developed healthcare associated pneumonia while in hospital. Given antibiotics and discharged 2 days ago with Augmentin. Patient states she's had continued improvement of his shortness of breath. Denies any fever or shaking chills. Believes that she is better than when she was discharged. Was called as the evening and informed of positive blood culture. States she had yeast growing in her blood and needed to come to the emergency department for evaluation. Past Medical History:  Diagnosis Date  . Anemia    previous blood transfusions  . Arthritis    "all over"  . Asthma   . Bradycardia    requiring previous d/c of BB and reduction of amiodarone  . CAD (coronary artery disease)    nonobstructive per notes  . Chronic diastolic CHF (congestive heart failure) ([place])   . CKD (chronic kidney disease), stage III   . Complication of blood transfusion    "got the wrong blood type at [facility] in ~ 2015; no adverse reaction that we are aware of"/daughter, [name] (01/27/2016)  . COPD (chronic obstructive pulmonary disease) ([place])   . Depression    "light case"  . DVT (deep venous thrombosis) ([place]) 01/2016   a. LLE DVT 01/2016 - switched from Eliquis to Coumadin.  . Gastric stenosis    a. s/p stomach tube  . GERD (gastroesophageal reflux disease)   . History of blood transfusion    "several" (01/27/2016)  . History of stomach ulcers   . Hyperlipidemia   . Hypertension   . Hypothyroidism   . Paraesophageal hernia   . Perforated gastric ulcer ([place])   . Seasonal allergies   . SIADH (syndrome of inappropriate ADH production) ([place])    [name] 01/10/2015  . Small bowel  obstruction    "I don't know how many" (01/11/2015)  . Stroke ([place])    "light one"  . Type II diabetes mellitus ([place])    "related to prednisone use  for > 20 yr; once predinose stopped; no more DM RX" (01/27/2016)  . Ventral hernia with bowel obstruction     Patient Active Problem List   Diagnosis Date Noted  . Calcium channel blocker adverse reaction 06/08/2016  . Elevated TSH   . Aspiration pneumonitis ([place])   . Other specified hypothyroidism   . Acute respiratory failure with hypoxia ([place]) 05/22/2016  . HCAP (healthcare-associated pneumonia)   . Acute on chronic congestive heart failure ([place])   . CHF (NYHA class IV, ACC/AHA stage D) ([place])   . Advance care planning   . Palliative care by specialist   . Shortness of breath   . Pressure injury of skin 03/11/2016  . PEG (percutaneous endoscopic gastrostomy) status ([place]) 03/10/2016  . Acute on chronic diastolic congestive heart failure ([place]) 03/02/2016  . Encounter for therapeutic drug monitoring 02/16/2016  . Sepsis ([place]) 02/08/2016  . UTI (urinary tract infection) 02/08/2016  . Warfarin anticoagulation   . Chronic seasonal allergic rhinitis   . Sinus pain   . Acute renal insufficiency 01/26/2016  . Acute deep vein thrombosis (DVT) of left lower extremity ([place])   . Acute respiratory failure ([place])   . Respiratory alkalosis   .  Atrial flutter ([place]) 01/24/2016  . Chronic diastolic CHF (congestive heart failure) ([place]) 01/24/2016  . Infection 01/24/2016  . Bilateral leg edema 12/29/2015  . Hypothyroidism due to medication   . Weakness   . Pyloric stenosis   . Pressure ulcer 09/15/2015  . Hyperkalemia 09/12/2015  . Acute osteomyelitis of toe of right foot ([place])   . PVD (peripheral vascular disease) ([place])   . Adjustment disorder with anxiety 08/29/2015  . Diabetic ulcer of toe of right foot associated with type 2 diabetes mellitus, with necrosis of muscle ([place])   . Diverticulitis of large intestine without perforation or  abscess without bleeding   . Colitis 08/26/2015  . Acute on chronic diastolic CHF (congestive heart failure), NYHA class 4 ([place]) 08/02/2015  . Esophageal candidiasis ([place])   . Goals of care, counseling/discussion   . Dyspnea   . SOB (shortness of breath)   . Acute renal failure superimposed on stage 4 chronic kidney disease ([place])   . Encounter for long-term (current) use of other high-risk medications   . Respiratory distress 07/06/2015  . Dysphagia 07/05/2015  . Asthma with allergic rhinitis 06/04/2015  . Hypertensive heart disease 03/18/2015  . Malnutrition of moderate degree 01/28/2015  . Syncope 01/27/2015  . Fever, unspecified 01/22/2015  . Symptomatic bradycardia 01/21/2015  . Hypotension due to drugs 01/21/2015  . Palliative care encounter   . DNR (do not resuscitate) discussion   . Protein-calorie malnutrition, severe 01/12/2015  . Elevated troponin 01/12/2015  . Essential hypertension 01/12/2015  . AP (abdominal pain) 01/10/2015  . SBO (small bowel obstruction) 01/10/2015  . Cerebrovascular disease 07/14/2014  . Diabetic polyneuropathy associated with type 2 diabetes mellitus ([place]) 07/14/2014  . Hyperlipidemia 07/14/2014  . Gastric outlet obstruction 05/21/2014  . Unstable gait 04/29/2014  . Hyponatremia 03/23/2014  . Acute encephalopathy 03/22/2014  . Slurred speech   . Gastroparesis   . Slow transit constipation 02/03/2014  . Carotid stenosis 02/03/2014  . Demand ischemia ([place]) 01/21/2014  . Gastric bezoar 01/20/2014  . Anemia of chronic disease 01/18/2014  . Chronic kidney insufficiency, stage 3 (moderate) 01/18/2014  . Depression 09/29/2013  . Pneumonia 09/29/2013  . Essential hypertension, benign 09/22/2013  . GERD (gastroesophageal reflux disease) 09/22/2013  . Dyslipidemia 09/22/2013  . Diabetes mellitus type 2 in nonobese ([place]) 09/22/2013  . Persistent atrial fibrillation ([place]) 09/22/2013  . CVA (cerebral vascular accident) ([place]) 09/22/2013  . Diabetic  neuropathy ([place]) 09/22/2013  . Hypothyroidism 09/22/2013  . Chronic gastrojejunal ulcer with perforation ([place]) 09/07/2013  . Diabetes ([place]) 09/07/2013  . Accelerated hypertension 09/07/2013  . Systemic lupus erythematosus (SLE) inhibitor 09/07/2013  . Other specified abnormal immunological findings in serum 09/07/2013    Past Surgical History:  Procedure Laterality Date  . CATARACT EXTRACTION W/ INTRAOCULAR LENS  IMPLANT, BILATERAL    . CHOLECYSTECTOMY OPEN    . COLECTOMY    . ESOPHAGOGASTRODUODENOSCOPY N/A 01/19/2014   Procedure: ESOPHAGOGASTRODUODENOSCOPY (EGD);  Surgeon: Irene Shipper, MD;  Location: [facility];  Service: Endoscopy;  Laterality: N/A;  . ESOPHAGOGASTRODUODENOSCOPY N/A 01/20/2014   Procedure: ESOPHAGOGASTRODUODENOSCOPY (EGD);  Surgeon: Irene Shipper, MD;  Location: [facility];  Service: Endoscopy;  Laterality: N/A;  . ESOPHAGOGASTRODUODENOSCOPY N/A 03/19/2014   Procedure: ESOPHAGOGASTRODUODENOSCOPY (EGD);  Surgeon: Milus Banister, MD;  Location: [facility];  Service: Endoscopy;  Laterality: N/A;  . ESOPHAGOGASTRODUODENOSCOPY N/A 07/08/2015   Procedure: ESOPHAGOGASTRODUODENOSCOPY (EGD);  Surgeon: Doran Stabler, MD;  Location[facility]COPY;  Service: Endoscopy;  Laterality: N/A;  . ESOPHAGOGASTRODUODENOSCOPY (EGD)  WITH PROPOFOL N/A 09/15/2015   Procedure: ESOPHAGOGASTRODUODENOSCOPY (EGD) WITH PROPOFOL;  Surgeon: Manus Gunning, MD;  [facility];  Service: Gastroenterology;  Laterality: N/A;  . GASTROJEJUNOSTOMY     hx/notes 01/10/2015  . GASTROJEJUNOSTOMY N/A 09/23/2015   Procedure: OPEN GASTROJEJUNOSTOMY TUBE PLACEMENT;  Surgeon: Arta Bruce Kinsinger, MD;  Location[facility];  Service: General;  Laterality: N/A;  . GLAUCOMA SURGERY Bilateral   . HERNIA REPAIR  2015  . IR GENERIC HISTORICAL  01/07/2016   IR GJ TUBE CHANGE 01/07/2016 Jacqulynn Cadet, MD [facility]  . IR GENERIC HISTORICAL  01/27/2016   IR MECH REMOV OBSTRUC MAT ANY COLON TUBE  W/FLUORO 01/27/2016 Markus Daft, MD [facility]  . IR GENERIC HISTORICAL  02/07/2016   IR PATIENT EVAL TECH 0-60 MINS Darrell K Allred, PA-C [facility]INTER[facility]  . IR GENERIC HISTORICAL  02/08/2016   IR GJ TUBE CHANGE 02/08/2016 Greggory Keen, MD [facility]  . IR GENERIC HISTORICAL  01/06/2016   IR GJ TUBE CHANGE 01/06/2016 [facility] OUT REF  . IR GENERIC HISTORICAL  05/02/2016   IR CM INJ ANY COLONIC TUBE W/FLUORO 05/02/2016 Arne Cleveland, MD [facility]  . IR GENERIC HISTORICAL  05/15/2016   IR GJ TUBE CHANGE 05/15/2016 Sandi Mariscal, MD [facility]  . LAPAROTOMY N/A 01/20/2015   Procedure: EXPLORATORY LAPAROTOMY;  Surgeon: Coralie Keens, MD;  Location: [facility];  Service: General;  Laterality: N/A;  . LYSIS OF ADHESION N/A 01/20/2015   Procedure: LYSIS OF ADHESIONS < 1 HOUR;  Surgeon: Coralie Keens, MD;  Location: [facility];  Service: General;  Laterality: N/A;  . TONSILLECTOMY    . TUBAL LIGATION    . VENTRAL HERNIA REPAIR  2015   incarcerated ventral hernia ([facility] 09/2013)/notes 01/10/2015    OB History    No data available       Home Medications    Prior to Admission medications   Medication Sig Start Date End Date Taking? Authorizing Provider  acetaminophen (TYLENOL) 325 MG tablet Take 325-650 mg by mouth 2 (two) times daily as needed for mild pain.    Yes Historical Provider, MD  AMINO ACIDS-PROTEIN HYDROLYS PO Take 30 mLs by mouth 2 (two) times daily.   Yes Historical Provider, MD  amLODipine (NORVASC) 10 MG tablet Take 1 tablet (10 mg total) by mouth daily. 06/13/16  Yes Bonnielee Haff, MD  amoxicillin-clavulanate (AUGMENTIN) 500-125 MG tablet Take 1 tablet (500 mg total) by mouth 2 (two) times daily. 06/16/16  Yes Verlee Monte, MD  atorvastatin (LIPITOR) 10 MG tablet TAKE ONE TABLET BY MOUTH ONCE DAILY 05/22/16  Yes Gildardo Cranker, DO  b complex vitamins tablet Take 1 tablet by mouth daily.    Yes Historical Provider, MD  bacitracin-polymyxin b (POLYSPORIN) ophthalmic ointment Place 1  application into both eyes 2 (two) times daily. apply to eye every 12 hours while awake   Yes Historical Provider, MD  bisacodyl (DULCOLAX) 10 MG suppository Place 1 suppository (10 mg total) rectally daily as needed for moderate constipation. 09/30/15  Yes Debbe Odea, MD  calcium-vitamin D (OSCAL WITH D) 500-200 MG-UNIT per tablet Take 1 tablet by mouth daily with breakfast.    Yes Historical Provider, MD  chlorhexidine (PERIDEX) 0.12 % solution Use as directed 15 mLs in the mouth or throat 2 (two) times daily.   Yes Historical Provider, MD  Chlorphen-PE-Acetaminophen (SINUS CONGESTION/PAIN DAY/NGHT PO) Take 1 capsule by mouth daily as needed (for sinus irritation).    Yes Historical Provider, MD  feeding supplement,  GLUCERNA SHAKE, (GLUCERNA SHAKE) LIQD Take 237 mLs by mouth daily.    Yes Historical Provider, MD  fluticasone (FLONASE) 50 MCG/ACT nasal spray Place 2 sprays into both nostrils daily. 08/07/14  Yes Gildardo Cranker, DO  furosemide (LASIX) 20 MG tablet Take 1 tablet (20 mg total) by mouth daily. 06/13/16  Yes Bonnielee Haff, MD  gabapentin (NEURONTIN) 300 MG capsule Take 1 capsule (300 mg total) by mouth at bedtime. 05/17/16  Yes Jennifer Chahn-Yang Choi, DO  Insulin Glargine (LANTUS SOLOSTAR) 100 UNIT/ML Solostar Pen Inject 5 Units into the skin daily at 10 pm. Patient taking differently: Inject 5 Units into the skin at bedtime as needed (for elevated BGL).  06/02/16  Yes Monica Carter, DO  ipratropium-albuterol (DUONEB) 0.5-2.5 (3) MG/3ML SOLN Take 3 mLs by nebulization every 6 (six) hours as needed (sob). Use 3 times daily x 4 days then every 6 hours as needed. Patient taking differently: Take 3 mLs by nebulization every 6 (six) hours as needed (for shortness of breath).  03/21/16  Yes Eileen Stanford, PA-C  latanoprost (XALATAN) 0.005 % ophthalmic solution Place 1 drop into both eyes at bedtime. 06/02/14  Yes Mahima Bubba Camp, MD  levalbuterol [facility] HFA) 45 MCG/ACT inhaler Inhale 2 puffs  into the lungs every 6 (six) hours as needed for wheezing or shortness of breath. 02/03/16  Yes Gildardo Cranker, DO  levothyroxine (SYNTHROID, LEVOTHROID) 137 MCG tablet TAKE 1 TABLET (137 MCG TOTAL) BY MOUTH DAILY BEFORE BREAKFAST. 04/04/16  Yes Gildardo Cranker, DO  losartan (COZAAR) 50 MG tablet Take 1 tablet (50 mg total) by mouth daily. 06/13/16  Yes Bonnielee Haff, MD  Maltodextrin-Xanthan Gum (RESOURCE THICKENUP CLEAR) POWD Take 120 g by mouth as needed (use to thicken liquids.). 11/11/15  Yes Eugenie Filler, MD  Multiple Vitamin (MULTIVITAMIN WITH MINERALS) TABS tablet Take 1 tablet by mouth daily.   Yes Historical Provider, MD  Nutritional Supplements (FEEDING SUPPLEMENT, OSMOLITE 1.5 CAL,) LIQD Place 1,000 mLs into feeding tube daily. Patient taking differently: Place 65 mL/hr into feeding tube daily. 2100-0800 09/30/15  Yes Debbe Odea, MD  Polyethyl Glycol-Propyl Glycol 0.4-0.3 % SOLN Place 1 drop into both eyes 4 (four) times daily.    Yes Historical Provider, MD  polyethylene glycol (MIRALAX / GLYCOLAX) packet Take 17 g by mouth daily as needed. Patient taking differently: Take 17 g by mouth daily as needed for mild constipation.  06/13/16  Yes Bonnielee Haff, MD  RESTASIS 0.05 % ophthalmic emulsion USE 1 DROP INTO BOTH EYES TWICE DAILY 11/01/15  Yes Estill Dooms, MD  sertraline (ZOLOFT) 100 MG tablet TAKE ONE TABLET BY MOUTH ONCE DAILY FOR DEPRESSION 04/10/16  Yes Gildardo Cranker, DO  traMADol (ULTRAM) 50 MG tablet Take 50 mg by mouth See admin instructions. Every 4-6 hours as needed for pain 05/01/16  Yes Historical Provider, MD  warfarin (COUMADIN) 2.5 MG tablet Take as directed by [facility]madin Clinic. Patient taking differently: Take 2.5-3.75 mg by mouth See admin instructions. 3.75 mg in the evening at 1800 (6 PM) on Sun/Tues/Wed/Thurs/Fri/Sat and 2.5 mg on Mon 02/16/16  Yes Dorothy Spark, MD  Water For Irrigation, Sterile (FREE WATER) SOLN Place 230 mLs into feeding tube 3 (three) times daily.    Yes Historical Provider, MD  fluconazole (DIFLUCAN) 200 MG tablet Take 1 tablet (200 mg total) by mouth daily. 06/19/16   Julianne Rice, MD  insulin aspart (NOVOLOG FLEXPEN) 100 UNIT/ML FlexPen Insulin sliding scale: Blood sugar  120-150   3units  151-200   4units                       201-250   7units                       251- 300  11units                       301-350   15uints                       351-400   20units                       >400         call MD immediately Patient not taking: Reported on 06/18/2016 05/30/16   Florencia Reasons, MD    Family History Family History  Problem Relation Age of Onset  . Stroke Mother   . Hypertension Mother   . Diabetes Brother   . Heart attack Neg Hx     Social History Social History  Substance Use Topics  . Smoking status: Never Smoker  . Smokeless tobacco: Former Systems developer    Types: Snuff     Comment: "used snuff in my younger days"  . Alcohol use No     Allergies   Penicillins   Review of Systems Review of Systems  Constitutional: Negative for appetite change, chills and fever.  Respiratory: Positive for wheezing. Negative for cough and shortness of breath.   Cardiovascular: Negative for chest pain.  Gastrointestinal: Negative for abdominal pain, diarrhea, nausea and vomiting.  Musculoskeletal: Negative for back pain and neck pain.  Neurological: Negative for weakness, numbness and headaches.  All other systems reviewed and are negative.    Physical Exam Updated Vital Signs BP (!) 173/56   Pulse (!) 58   Temp 97.6 F (36.4 C) (Oral)   Resp 20   SpO2 100%   Physical Exam  Constitutional: She is oriented to person, place, and time. She appears well-developed and well-nourished.  Frail-appearing  HENT:  Head: Normocephalic and atraumatic.  Mouth/Throat: Oropharynx is clear and moist. No oropharyngeal exudate.  Eyes: EOM are normal. Pupils are equal, round, and reactive to light.  Neck: Normal range  of motion. Neck supple. No JVD present.  Cardiovascular: Normal rate and regular rhythm.  Exam reveals no gallop and no friction rub.   No murmur heard. Pulmonary/Chest: Effort normal. She has wheezes.  Scattered expiratory wheezes. No respiratory distress.  Abdominal: Soft. Bowel sounds are normal. There is no tenderness. There is no rebound and no guarding.  Musculoskeletal: Normal range of motion. She exhibits no edema or tenderness.  No lower extremity swelling, asymmetry or tenderness.  Neurological: She is alert and oriented to person, place, and time.  Moving all extremities without deficit. Sensation fully intact.  Skin: Skin is warm and dry. Capillary refill takes less than 2 seconds. No rash noted. No erythema.  Psychiatric: She has a normal mood and affect. Her behavior is normal.  Nursing note and vitals reviewed.    ED Treatments / Results  Labs (all labs ordered are listed, but only abnormal results are displayed) Labs Reviewed  CBC WITH DIFFERENTIAL/PLATELET - Abnormal; Notable for the following:       Result Value   RBC 3.64 (*)    Hemoglobin 10.4 (*)    HCT 31.0 (*)    RDW 21.9 (*)  Monocytes Absolute 1.1 (*)    All other components within normal limits  COMPREHENSIVE METABOLIC PANEL - Abnormal; Notable for the following:    BUN 35 (*)    Creatinine, Ser 1.41 (*)    Albumin 3.1 (*)    GFR calc non Af Amer 32 (*)    GFR calc Af Amer 37 (*)    All other components within normal limits  CULTURE, BLOOD (ROUTINE X 2)  CULTURE, BLOOD (ROUTINE X 2)    EKG  EKG Interpretation None       Radiology Dg Chest 2 View  Result Date: 06/18/2016 CLINICAL DATA:  83 year old female with recent history of heart failure. Yeast in recent blood cultures. EXAM: CHEST  2 VIEW COMPARISON:  Chest x-ray 06/13/2016. FINDINGS: Mild diffuse peribronchial cuffing, most evident throughout the mid to lower lungs bilaterally. Lung volumes are low. No consolidative airspace disease.  No pleural effusions. No pneumothorax. No pulmonary nodule or mass noted. Pulmonary vasculature and the cardiomediastinal silhouette are within normal limits. Aortic atherosclerosis. IMPRESSION: 1. Mild diffuse peribronchial cuffing, most evident in the mid to lower lungs bilaterally, concerning for underlying bronchitis. 2. Aortic atherosclerosis. Electronically Signed   By: Vinnie Langton M.D.   On: 06/18/2016 16:49    Procedures Procedures (including critical care time)  Medications Ordered in ED Medications  fluconazole (DIFLUCAN) tablet 200 mg (200 mg Oral Given 06/18/16 1741)     Initial Impression / Assessment and Plan / ED Course  I have reviewed the triage vital signs and the nursing notes.  Pertinent labs & imaging results that were available during my care of the patient were reviewed by me and considered in my medical decision making (see chart for details).    Discussed with Dr. Linus Salmons. Recommends Diflucan by mouth 200 mg every day for 2 weeks. Advised to follow-up with her primary physician in several days to repeat blood cultures. Patient has normal white blood cell count. She is well-appearing. Afebrile. Given dose of Diflucan here and discussed discharge instructions with both the patient and her daughter. Both agree with plan. Understanding the need to return immediately for fever, worsening shortness of breath, diminished mental status or for any concerns.  Final Clinical Impressions(s) / ED Diagnoses   Final diagnoses:  Blood culture positive for Candida species    New Prescriptions New Prescriptions   FLUCONAZOLE (DIFLUCAN) 200 MG TABLET    Take 1 tablet (200 mg total) by mouth daily.     Julianne Rice, MD 06/18/16 [phone]

## 2016-06-18 NOTE — Discharge Instructions (Signed)
Make an appointment to follow-up with your primary physician in 2-3 days to repeat blood cultures and for reassessment. Return immediately for fever, worsening shortness of breath or for any concerns.

## 2016-06-18 NOTE — ED Triage Notes (Signed)
Daughter stated, my mother just got out of hospital on Friday with heart failure, and they called today and said bring her back to ER , she needed a IV cause it shows she has yeast in her blood.

## 2016-06-19 ENCOUNTER — Ambulatory Visit (INDEPENDENT_AMBULATORY_CARE_PROVIDER_SITE_OTHER): Payer: Medicare Other | Admitting: Interventional Cardiology

## 2016-06-19 DIAGNOSIS — I130 Hypertensive heart and chronic kidney disease with heart failure and stage 1 through stage 4 chronic kidney disease, or unspecified chronic kidney disease: Secondary | ICD-10-CM | POA: Diagnosis not present

## 2016-06-19 DIAGNOSIS — I5033 Acute on chronic diastolic (congestive) heart failure: Secondary | ICD-10-CM | POA: Diagnosis not present

## 2016-06-19 DIAGNOSIS — J189 Pneumonia, unspecified organism: Secondary | ICD-10-CM | POA: Diagnosis not present

## 2016-06-19 DIAGNOSIS — E1122 Type 2 diabetes mellitus with diabetic chronic kidney disease: Secondary | ICD-10-CM | POA: Diagnosis not present

## 2016-06-19 DIAGNOSIS — Z5181 Encounter for therapeutic drug level monitoring: Secondary | ICD-10-CM

## 2016-06-19 DIAGNOSIS — N183 Chronic kidney disease, stage 3 (moderate): Secondary | ICD-10-CM | POA: Diagnosis not present

## 2016-06-19 DIAGNOSIS — I2510 Atherosclerotic heart disease of native coronary artery without angina pectoris: Secondary | ICD-10-CM | POA: Diagnosis not present

## 2016-06-19 DIAGNOSIS — I4892 Unspecified atrial flutter: Secondary | ICD-10-CM

## 2016-06-19 DIAGNOSIS — I824Y2 Acute embolism and thrombosis of unspecified deep veins of left proximal lower extremity: Secondary | ICD-10-CM

## 2016-06-19 LAB — CULTURE, BLOOD (ROUTINE X 2): Culture: NO GROWTH

## 2016-06-19 LAB — POCT INR: INR: 1.5

## 2016-06-19 IMAGING — CT CT ABD-PELV W/ CM
1 of 3 series · 13 of 32 positions shown, 18 images · IV contrast (OMNIPAQUE 300)
Comparison: None.

CLINICAL DATA: Vomiting. Abdominal pain. History of small bowel
obstruction.

EXAM:
CT ABDOMEN AND PELVIS WITH CONTRAST
TECHNIQUE: Multidetector CT imaging of the abdomen and pelvis was performed
using the standard protocol following bolus administration of
intravenous contrast.
CONTRAST:  50mL OMNIPAQUE IOHEXOL 300 MG/ML SOLN, 80mL OMNIPAQUE
IOHEXOL 300 MG/ML SOLN

[Series 2: abd/pel with · axial · 0.78mm/px · z∈[+1135,+1525]mm · 13 of 88 slices shown, 18 images]
[im 5/88  soft-tissue]
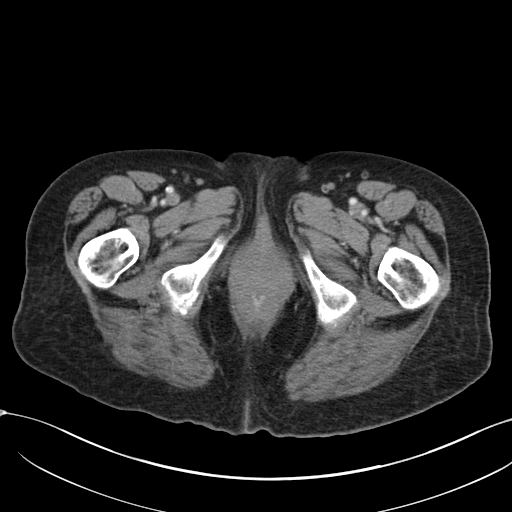
[im 5/88  bone]
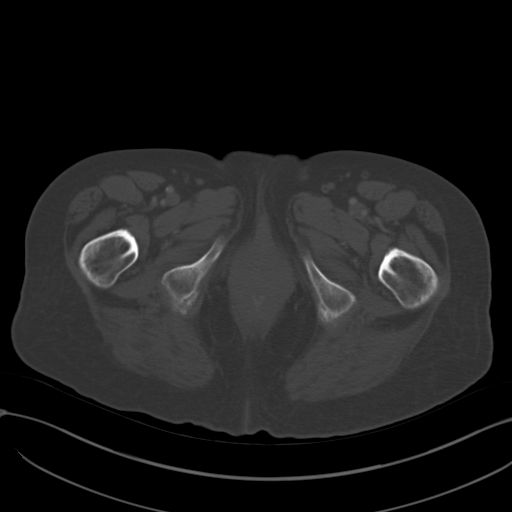
[im 14/88  soft-tissue]
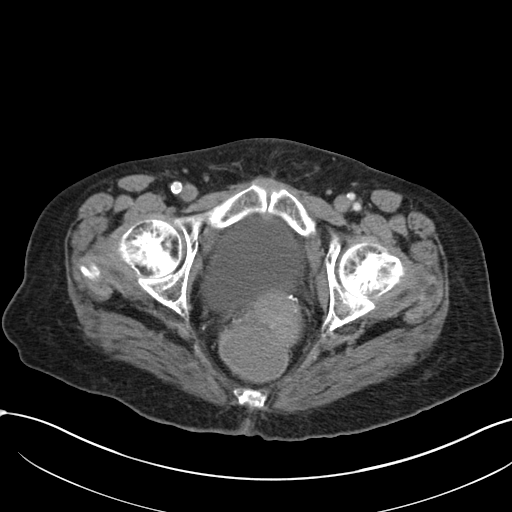
[im 19/88  soft-tissue]
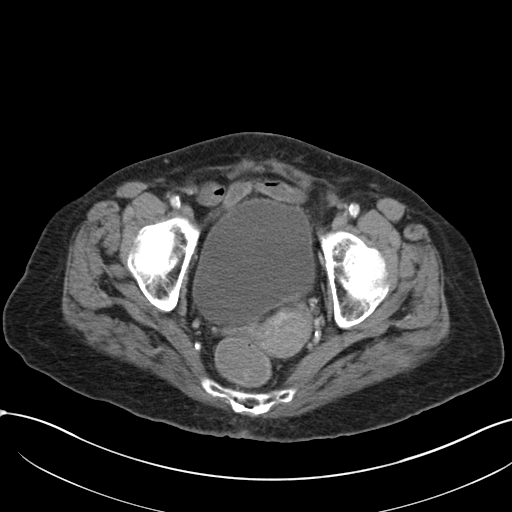
[im 28/88  soft-tissue]
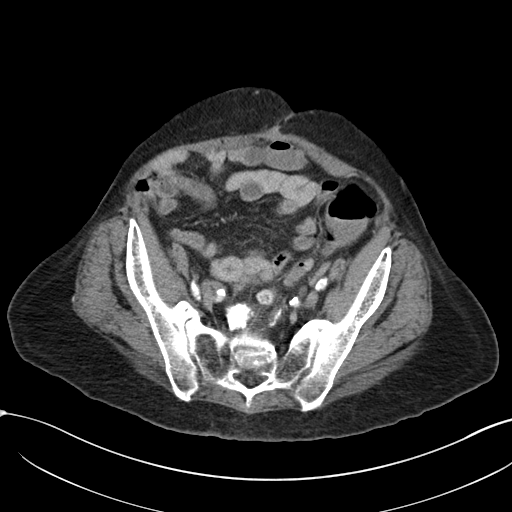
[im 33/88  soft-tissue]
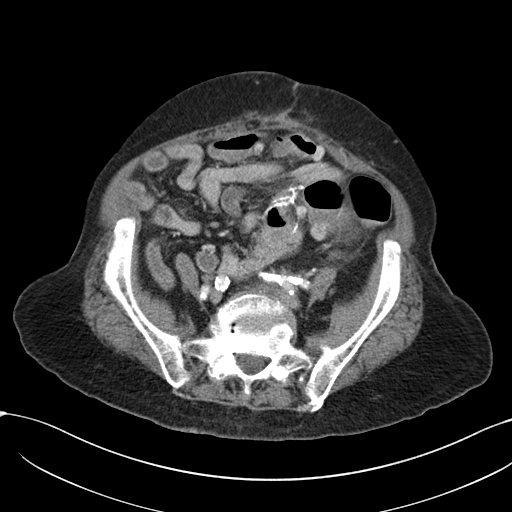
[im 42/88  soft-tissue]
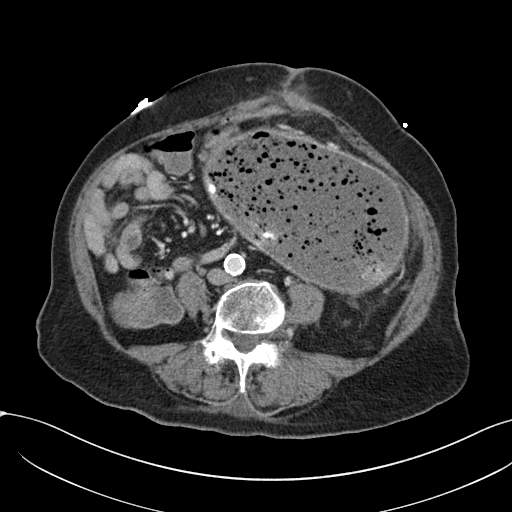
[im 46/88  soft-tissue]
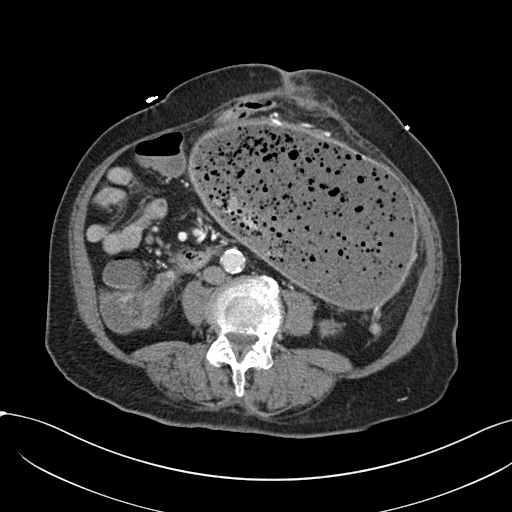
[im 55/88  soft-tissue]
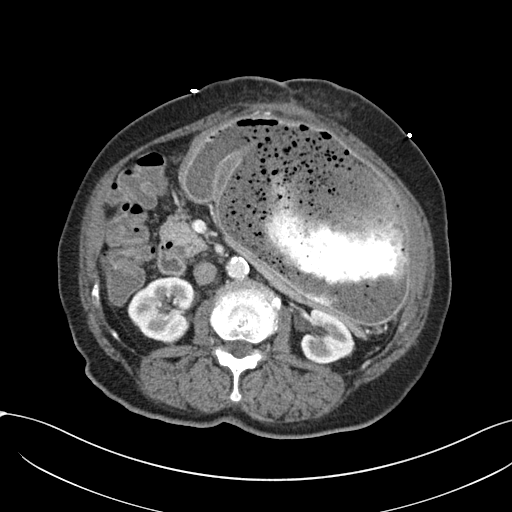
[im 60/88  soft-tissue]
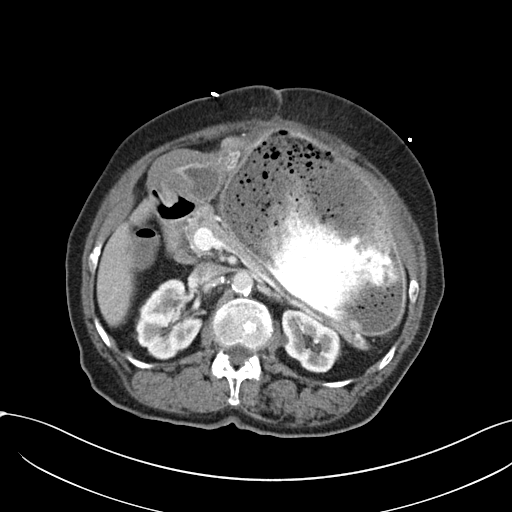
[im 60/88  bone]
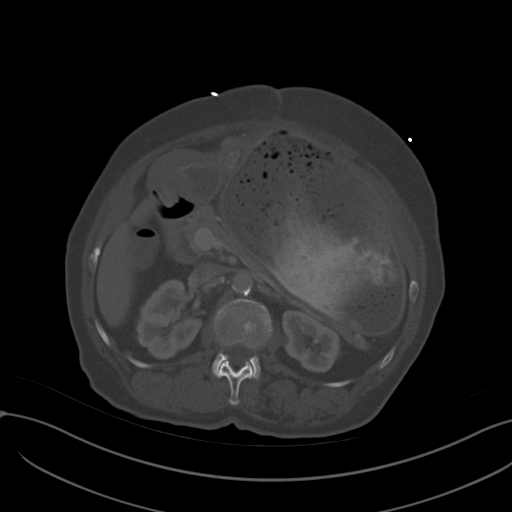
[im 69/88  soft-tissue]
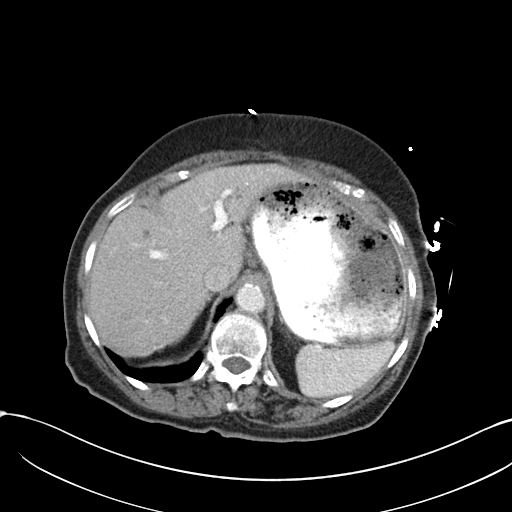
[im 69/88  lung]
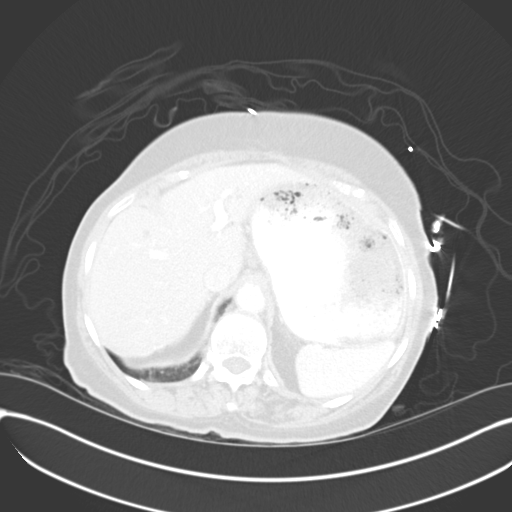
[im 74/88  soft-tissue]
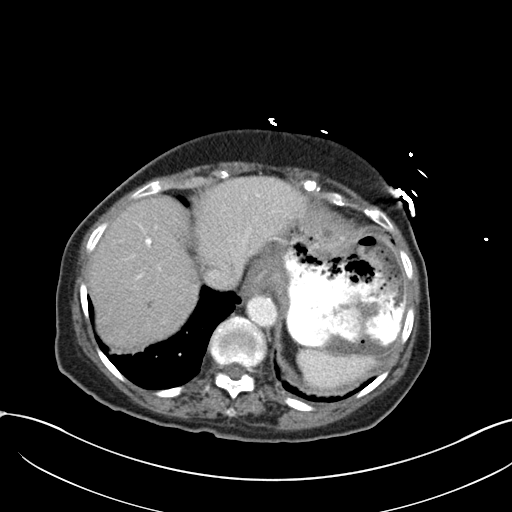
[im 74/88  lung]
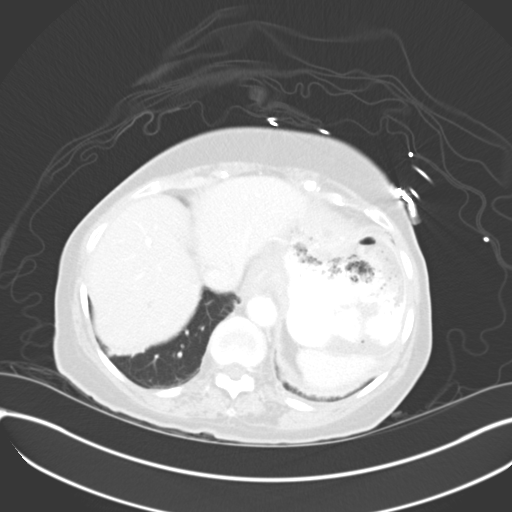
[im 78/88  lung]
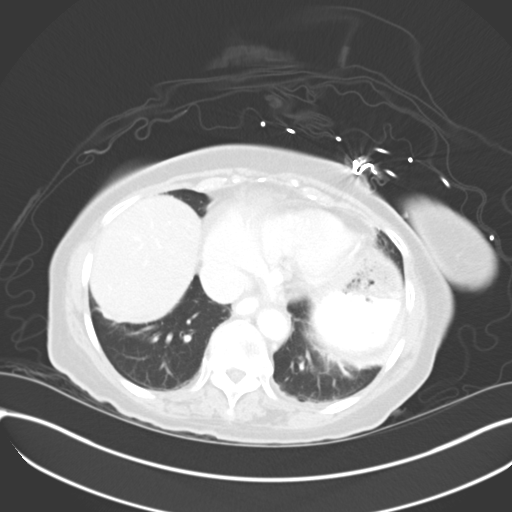
[im 83/88  soft-tissue]
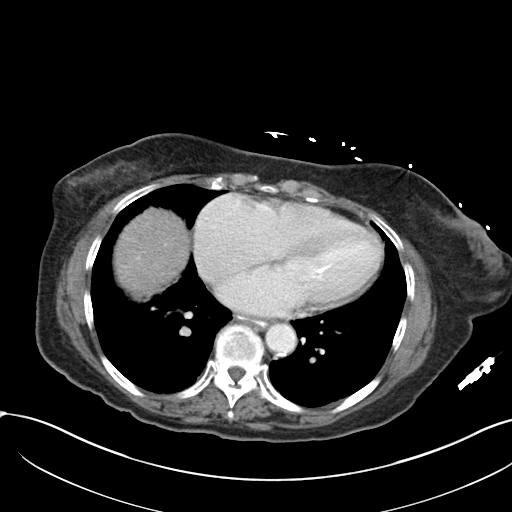
[im 83/88  lung]
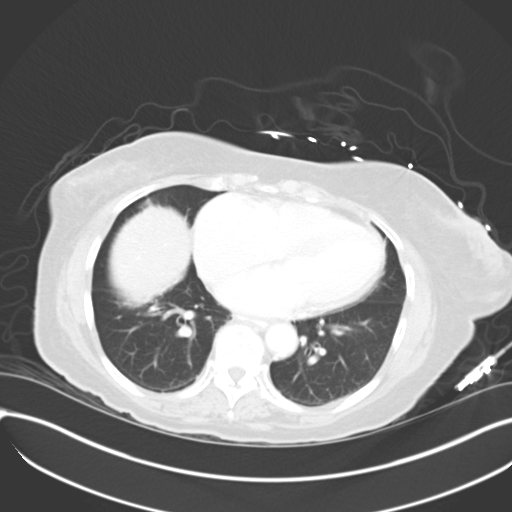

[13 of 32 positions shown; findings below may reference images not displayed]

FINDINGS: BODY WALL: Unremarkable.

LOWER CHEST: Cardiomegaly. Gastroesophageal reflux in the setting of
gastric outlet obstruction.

ABDOMEN/PELVIS:

Liver: Numerous well circumscribed low-density lesions most
consistent with cysts. No indication of malignancy history in the
electronic medical record.

Biliary: Cholecystectomy.

Pancreas: Atrophy without acute finding.

Spleen: Incidental sub cm cyst or other lesion in the anterior
subcapsular spleen.

Adrenals: Unremarkable.

Kidneys and ureters: Lobulation of both renal cortices consistent
with scarring. Bilateral renal atrophy. No hydronephrosis.

Bladder: Unremarkable.

Reproductive: Unremarkable for age.  The pelvic floor appears low.

Bowel: The stomach is massively dilated to the level of the pylorus
which is circumferentially thickened (single wall thickness of 15
mm). The mucosa is distinct at the level of the pylorus, with
submucosal low-density favoring inflammatory edema. No evidence for
perforation or pneumatosis. There is been in enteroenterostomy which
is patent. This may be related to electronic medical record history
of small bowel obstruction. Sutures noted along the anterior gastric
body, likely related to patient's history perforated gastric ulcer.
Fluid levels present in the distal colon consistent with diarrhea.
Colonic diverticulosis.

Retroperitoneum: No mass or adenopathy.

Peritoneum: No ascites or pneumoperitoneum.

Vascular: No acute abnormality.

OSSEOUS: No acute abnormalities. Remote appearing T11 compression
fracture with approximate 50% height loss.
IMPRESSION: 1. High-grade gastric outlet obstruction with pylorus appearance
favoring peptic ulcer disease over mass. Follow-up endoscopy or
upper GI suggested.
2. Numerous non acute findings are noted above.

## 2016-06-20 DIAGNOSIS — E1122 Type 2 diabetes mellitus with diabetic chronic kidney disease: Secondary | ICD-10-CM | POA: Diagnosis not present

## 2016-06-20 DIAGNOSIS — I5033 Acute on chronic diastolic (congestive) heart failure: Secondary | ICD-10-CM | POA: Diagnosis not present

## 2016-06-20 DIAGNOSIS — J189 Pneumonia, unspecified organism: Secondary | ICD-10-CM | POA: Diagnosis not present

## 2016-06-20 DIAGNOSIS — N183 Chronic kidney disease, stage 3 (moderate): Secondary | ICD-10-CM | POA: Diagnosis not present

## 2016-06-20 DIAGNOSIS — I130 Hypertensive heart and chronic kidney disease with heart failure and stage 1 through stage 4 chronic kidney disease, or unspecified chronic kidney disease: Secondary | ICD-10-CM | POA: Diagnosis not present

## 2016-06-20 DIAGNOSIS — I2510 Atherosclerotic heart disease of native coronary artery without angina pectoris: Secondary | ICD-10-CM | POA: Diagnosis not present

## 2016-06-21 ENCOUNTER — Ambulatory Visit (INDEPENDENT_AMBULATORY_CARE_PROVIDER_SITE_OTHER): Payer: Medicare Other | Admitting: Cardiovascular Disease

## 2016-06-21 DIAGNOSIS — E1122 Type 2 diabetes mellitus with diabetic chronic kidney disease: Secondary | ICD-10-CM | POA: Diagnosis not present

## 2016-06-21 DIAGNOSIS — Z5181 Encounter for therapeutic drug level monitoring: Secondary | ICD-10-CM

## 2016-06-21 DIAGNOSIS — I824Y2 Acute embolism and thrombosis of unspecified deep veins of left proximal lower extremity: Secondary | ICD-10-CM

## 2016-06-21 DIAGNOSIS — I2510 Atherosclerotic heart disease of native coronary artery without angina pectoris: Secondary | ICD-10-CM | POA: Diagnosis not present

## 2016-06-21 DIAGNOSIS — J189 Pneumonia, unspecified organism: Secondary | ICD-10-CM | POA: Diagnosis not present

## 2016-06-21 DIAGNOSIS — I4892 Unspecified atrial flutter: Secondary | ICD-10-CM

## 2016-06-21 DIAGNOSIS — N183 Chronic kidney disease, stage 3 (moderate): Secondary | ICD-10-CM | POA: Diagnosis not present

## 2016-06-21 DIAGNOSIS — I130 Hypertensive heart and chronic kidney disease with heart failure and stage 1 through stage 4 chronic kidney disease, or unspecified chronic kidney disease: Secondary | ICD-10-CM | POA: Diagnosis not present

## 2016-06-21 DIAGNOSIS — I5033 Acute on chronic diastolic (congestive) heart failure: Secondary | ICD-10-CM | POA: Diagnosis not present

## 2016-06-21 LAB — POCT INR: INR: 1.6

## 2016-06-22 ENCOUNTER — Encounter: Payer: Self-pay | Admitting: Pulmonary Disease

## 2016-06-22 ENCOUNTER — Ambulatory Visit (INDEPENDENT_AMBULATORY_CARE_PROVIDER_SITE_OTHER): Payer: Medicare Other | Admitting: Pulmonary Disease

## 2016-06-22 VITALS — BP 124/68 | HR 74 | Ht 66.0 in | Wt 140.3 lb

## 2016-06-22 DIAGNOSIS — R0600 Dyspnea, unspecified: Secondary | ICD-10-CM

## 2016-06-22 NOTE — Patient Instructions (Signed)
Follow-up in 6 months Continue nebulizer and inhaler as prescribed

## 2016-06-22 NOTE — Progress Notes (Signed)
Shannon [name]    [id number]    June 03, 1923  Primary Care Physician:Carter, Maxine Glenn, DO  Referring Physician: Kirt Boys, DO [address] [place], [place] [address]  Chief complaint:   Follow up for dyspnea  HPI: Shannon [name] is a 83 year old with complicated medical history including heart failure, chronic kidney disease, diabetes, dysphagia with recurrent aspiration status post PEG tube placement, gastric outlet obstruction. She has been hospitalized multiple times over the past yearwith bradycardia, weakness, rapid atrial flutter, left lower extremity DVT,sepsisdue to UTI. Her last admission was in December 2017 for acute on chronic heart failure. At that time palliative care was consulted. She was discharged home with a referral for home palliative care. She is currently DNR/DNI.   She returns to clinic today with chief complaints of dyspnea on exertion with occasional wheeze. She has occasional cough with mucus production. He denies any fevers, chills. She feels that she is slowly improving. She is currently maintained on DuoNeb which she takes twice daily. She is also on Xopenex neb PRN. She has been prescribed Advair but is not taking it on a regular basis. She is a never smoker with no known exposures.  Interim history:  She was hospitalized earlier this month for symptomatic bradycardia, hypotension secondary to beta blocker and calcium channel blocker. She had to be treated with dopamine and glucagon drip. She was evaluated by cardiology but declined a pacemaker. Her stay was complicated by hospital-acquired pneumonia. Also noted to have 1 out of 2 bottles growing Candida. This was thought to be a contaminant but she was started on Diflucan. Repeat cultures are negative.  Outpatient Encounter Prescriptions as of 06/22/2016  Medication Sig  . acetaminophen (TYLENOL) 325 MG tablet Take 325-650 mg by mouth 2 (two) times daily as needed for mild pain.   [name] AMINO ACIDS-PROTEIN  HYDROLYS PO Take 30 mLs by mouth 2 (two) times daily.  [name] amLODipine (NORVASC) 10 MG tablet Take 1 tablet (10 mg total) by mouth daily.  [name] atorvastatin (LIPITOR) 10 MG tablet TAKE ONE TABLET BY MOUTH ONCE DAILY  . b complex vitamins tablet Take 1 tablet by mouth daily.   . bacitracin-polymyxin b (POLYSPORIN) ophthalmic ointment Place 1 application into both eyes 2 (two) times daily. apply to eye every 12 hours while awake  . bisacodyl (DULCOLAX) 10 MG suppository Place 1 suppository (10 mg total) rectally daily as needed for moderate constipation.  . calcium-vitamin D (OSCAL WITH D) 500-200 MG-UNIT per tablet Take 1 tablet by mouth daily with breakfast.   . chlorhexidine (PERIDEX) 0.12 % solution Use as directed 15 mLs in the mouth or throat 2 (two) times daily.  . Chlorphen-PE-Acetaminophen (SINUS CONGESTION/PAIN DAY/NGHT PO) Take 1 capsule by mouth daily as needed (for sinus irritation).   . feeding supplement, GLUCERNA SHAKE, (GLUCERNA SHAKE) LIQD Take 237 mLs by mouth daily.   . fluconazole (DIFLUCAN) 200 MG tablet Take 1 tablet (200 mg total) by mouth daily.  . fluticasone (FLONASE) 50 MCG/ACT nasal spray Place 2 sprays into both nostrils daily.  . furosemide (LASIX) 20 MG tablet Take 1 tablet (20 mg total) by mouth daily.  [name] gabapentin (NEURONTIN) 300 MG capsule Take 1 capsule (300 mg total) by mouth at bedtime.  . insulin aspart (NOVOLOG FLEXPEN) 100 UNIT/ML FlexPen Insulin sliding scale: Blood sugar  120-150   3units  151-200   4units                       201-250   7units                       251- 300  11units                       301-350   15uints                       351-400   20units                       >400         call MD immediately  . Insulin Glargine (LANTUS SOLOSTAR) 100 UNIT/ML Solostar Pen Inject 5 Units into the skin daily at 10 pm. (Patient taking differently: Inject 5 Units into the skin at bedtime as needed (for elevated BGL). )  .  ipratropium-albuterol (DUONEB) 0.5-2.5 (3) MG/3ML SOLN Take 3 mLs by nebulization every 6 (six) hours as needed (sob). Use 3 times daily x 4 days then every 6 hours as needed. (Patient taking differently: Take 3 mLs by nebulization every 6 (six) hours as needed (for shortness of breath). )  . latanoprost (XALATAN) 0.005 % ophthalmic solution Place 1 drop into both eyes at bedtime.  . levalbuterol (XOPENEX HFA) 45 MCG/ACT inhaler Inhale 2 puffs into the lungs every 6 (six) hours as needed for wheezing or shortness of breath.  . levothyroxine (SYNTHROID, LEVOTHROID) 137 MCG tablet TAKE 1 TABLET (137 MCG TOTAL) BY MOUTH DAILY BEFORE BREAKFAST.  [name] losartan (COZAAR) 50 MG tablet Take 1 tablet (50 mg total) by mouth daily.  . Maltodextrin-Xanthan Gum (RESOURCE THICKENUP CLEAR) POWD Take 120 g by mouth as needed (use to thicken liquids.).  [name] Multiple Vitamin (MULTIVITAMIN WITH MINERALS) TABS tablet Take 1 tablet by mouth daily.  . Nutritional Supplements (FEEDING SUPPLEMENT, OSMOLITE 1.5 CAL,) LIQD Place 1,000 mLs into feeding tube daily. (Patient taking differently: Place 65 mL/hr into feeding tube daily. 2100-0800)  . Polyethyl Glycol-Propyl Glycol 0.4-0.3 % SOLN Place 1 drop into both eyes 4 (four) times daily.   . polyethylene glycol (MIRALAX / GLYCOLAX) packet Take 17 g by mouth daily as needed. (Patient taking differently: Take 17 g by mouth daily as needed for mild constipation. )  . RESTASIS 0.05 % ophthalmic emulsion USE 1 DROP INTO BOTH EYES TWICE DAILY  . sertraline (ZOLOFT) 100 MG tablet TAKE ONE TABLET BY MOUTH ONCE DAILY FOR DEPRESSION  . traMADol (ULTRAM) 50 MG tablet Take 50 mg by mouth See admin instructions. Every 4-6 hours as needed for pain  . warfarin (COUMADIN) 2.5 MG tablet TAKE AS DIRECTED BY COUMADIN CLINIC.  [name] Water For Irrigation, Sterile (FREE WATER) SOLN Place 230 mLs into feeding tube 3 (three) times daily.  . [DISCONTINUED] amoxicillin-clavulanate (AUGMENTIN) 500-125 MG tablet  Take 1 tablet (500 mg total) by mouth 2 (two) times daily.   No facility-administered encounter medications on file as of 06/22/2016.     Allergies as of 06/22/2016 - Review Complete 06/22/2016  Allergen Reaction Noted  . Penicillins Itching, Rash, and Other (See Comments) 02/12/2016    Past Medical History:  Diagnosis Date  . Anemia    previous blood transfusions  . Arthritis    "all over"  . Asthma   . Bradycardia  requiring previous d/c of BB and reduction of amiodarone  . CAD (coronary artery disease)    nonobstructive per notes  . Chronic diastolic CHF (congestive heart failure) (HCC)   . CKD (chronic kidney disease), stage III   . Complication of blood transfusion    "got the wrong blood type at [facility] in ~ 2015; no adverse reaction that we are aware of"/daughter, [name] (01/27/2016)  . COPD (chronic obstructive pulmonary disease) (HCC)   . Depression    "light case"  . DVT (deep venous thrombosis) (HCC) 01/2016   a. LLE DVT 01/2016 - switched from Eliquis to Coumadin.  . Gastric stenosis    a. s/p stomach tube  . GERD (gastroesophageal reflux disease)   . History of blood transfusion    "several" (01/27/2016)  . History of stomach ulcers   . Hyperlipidemia   . Hypertension   . Hypothyroidism   . Paraesophageal hernia   . Perforated gastric ulcer (HCC)   . Seasonal allergies   . SIADH (syndrome of inappropriate ADH production) (HCC)    [name] 01/10/2015  . Small bowel obstruction    "I don't know how many" (01/11/2015)  . Stroke (HCC)    "light one"  . Type II diabetes mellitus (HCC)    "related to prednisone use  for > 20 yr; once predinose stopped; no more DM RX" (01/27/2016)  . Ventral hernia with bowel obstruction     Past Surgical History:  Procedure Laterality Date  . CATARACT EXTRACTION W/ INTRAOCULAR LENS  IMPLANT, BILATERAL    . CHOLECYSTECTOMY OPEN    . COLECTOMY    . ESOPHAGOGASTRODUODENOSCOPY N/A 01/19/2014   Procedure:  ESOPHAGOGASTRODUODENOSCOPY (EGD);  Surgeon: Hilarie Fredrickson, MD;  Location: [facility];  Service: Endoscopy;  Laterality: N/A;  . ESOPHAGOGASTRODUODENOSCOPY N/A 01/20/2014   Procedure: ESOPHAGOGASTRODUODENOSCOPY (EGD);  Surgeon: Hilarie Fredrickson, MD;  Location: [facility];  Service: Endoscopy;  Laterality: N/A;  . ESOPHAGOGASTRODUODENOSCOPY N/A 03/19/2014   Procedure: ESOPHAGOGASTRODUODENOSCOPY (EGD);  Surgeon: Rachael Fee, MD;  Location: [facility];  Service: Endoscopy;  Laterality: N/A;  . ESOPHAGOGASTRODUODENOSCOPY N/A 07/08/2015   Procedure: ESOPHAGOGASTRODUODENOSCOPY (EGD);  Surgeon: Sherrilyn Rist, MD;  Location: [facility] ENDOSCOPY;  Service: Endoscopy;  Laterality: N/A;  . ESOPHAGOGASTRODUODENOSCOPY (EGD) WITH PROPOFOL N/A 09/15/2015   Procedure: ESOPHAGOGASTRODUODENOSCOPY (EGD) WITH PROPOFOL;  Surgeon: Ruffin Frederick, MD;  Location: [facility] END[facility];  Service: Gastroenterology;  Laterality: N/A;  . GASTROJEJUNOSTOMY     hx/notes 01/10/2015  . GASTROJEJUNOSTOMY N/A 09/23/2015   Procedure: OPEN GASTROJEJUNOSTOMY TUBE PLACEMENT;  Surgeon: De Blanch Kinsinger, MD;  Location: [facility] ORS;  Service: General;  Laterality: N/A;  . GLAUCOMA SURGERY Bilateral   . HERNIA REPAIR  2015  . IR GENERIC HISTORICAL  01/07/2016   IR GJ TUBE CHANGE 01/07/2016 Malachy Moan, MD [facility]  . IR GENERIC HISTORICAL  01/27/2016   IR MECH REMOV OBSTRUC MAT ANY COLON TUBE W/FLUORO 01/27/2016 Richarda Overlie, MD [facility]  . IR GENERIC HISTORICAL  02/07/2016   IR PATIENT EVAL TECH 0-60 MINS Darrell K Allred, PA-C [facility]  . IR GENERIC HISTORICAL  02/08/2016   IR GJ TUBE CHANGE 02/08/2016 Berdine Dance, MD [facility]  . IR GENERIC HISTORICAL  01/06/2016   IR GJ TUBE CHANGE 01/06/2016 [facility] OUT REF  . IR GENERIC HISTORICAL  05/02/2016   IR CM INJ ANY COLONIC TUBE W/FLUORO 05/02/2016 Oley Balm, MD [facility]  . IR GENERIC HISTORICAL  05/15/2016   IR GJ TUBE CHANGE 05/15/2016 Jonny Ruiz  Grace Isaac, MD [facility]   . LAPAROTOMY N/A 01/20/2015   Procedure: EXPLORATORY LAPAROTOMY;  Surgeon: Abigail Miyamoto, MD;  Location: [facility] OR;  Service: General;  Laterality: N/A;  . LYSIS OF ADHESION N/A 01/20/2015   Procedure: LYSIS OF ADHESIONS < 1 HOUR;  Surgeon: Abigail Miyamoto, MD;  Location: [facility] OR;  Service: General;  Laterality: N/A;  . TONSILLECTOMY    . TUBAL LIGATION    . VENTRAL HERNIA REPAIR  2015   incarcerated ventral hernia ([facility] 09/2013)/notes 01/10/2015    Family History  Problem Relation Age of Onset  . Stroke Mother   . Hypertension Mother   . Diabetes Brother   . Heart attack Neg Hx     Social History   Social History  . Marital status: Widowed    Spouse name: N/A  . Number of children: 3  . Years of education: N/A   Occupational History  . Not on file.   Social History Main Topics  . Smoking status: Never Smoker  . Smokeless tobacco: Former Neurosurgeon    Types: Snuff     Comment: "used snuff in my younger days"  . Alcohol use No  . Drug use: No  . Sexual activity: No   Other Topics Concern  . Not on file   Social History Narrative   Married in [place], lives in one story house with 2 people and no pets   Occupation: ?   Has a living Will, [place], and doesn't have DNR   Was living with Husband (in frail health) in [place] until 09/2013.  After her [facility] admission they both came to live with daughter in [place]   Review of systems: Review of Systems  Constitutional: Negative for fever and chills.  HENT: Negative.   Eyes: Negative for blurred vision.  Respiratory: as per HPI  Cardiovascular: Negative for chest pain and palpitations.  Gastrointestinal: Negative for vomiting, diarrhea, blood per rectum. Genitourinary: Negative for dysuria, urgency, frequency and hematuria.  Musculoskeletal: Negative for myalgias, back pain and joint pain.  Skin: Negative for itching and rash.  Neurological: Negative for dizziness, tremors, focal weakness, seizures and loss of consciousness.    Endo/Heme/Allergies: Negative for environmental allergies.  Psychiatric/Behavioral: Negative for depression, suicidal ideas and hallucinations.  All other systems reviewed and are negative.  Physical Exam: Blood pressure 124/68, pulse 74, height 5\' 6"  (1.676 m), weight 140 lb 4.8 oz (63.6 kg), SpO2 99 %. Gen:      No acute distress HEENT:  EOMI, sclera anicteric Neck:     No masses; no thyromegaly Lungs:    Clear to auscultation bilaterally; normal respiratory effort CV:         Regular rate and rhythm; no murmurs Abd:      + bowel sounds; soft, non-tender; no palpable masses, no distension Ext:    No edema; adequate peripheral perfusion Skin:      Warm and dry; no rash Neuro: alert and oriented x 3 Psych: normal mood and affect  Data Reviewed: CXR 03/10/16- left lower lobe infiltrate with associated effusion.  CXR 06/18/16- mild bronchitis. All images personally reviewed.  Echo 01/28/16 Normal LV systolic function; grade 1 diastolic dysfunction; mild AI; mild MR; moderate LAE.  Assessment:  Follow up of dyspnea I suspect her dyspnea is probably multifactorial from heart failure, chronic kidney disease, volume overload, issues with bradycardia. She has a diagnosis of COPD and asthma but she is a never smoker and does not have symptoms cough, mucus, wheezing. She was  unable to complete an FENO in the office today and I don't believe that she'll be able to do spirometry or PFTs as well due to problems with coordination.  She will continue on nebulizers and xopenex  Return in 3 months  Plan/Recommendations: - Continue Duonebs and xopenex inhaler.   Chilton Greathouse MD [facility] Pager [phone] 06/22/2016, 4:43 PM  CC: Kirt Boys, DO

## 2016-06-23 ENCOUNTER — Encounter: Payer: Self-pay | Admitting: Nurse Practitioner

## 2016-06-23 ENCOUNTER — Ambulatory Visit (INDEPENDENT_AMBULATORY_CARE_PROVIDER_SITE_OTHER): Payer: Medicare Other | Admitting: Nurse Practitioner

## 2016-06-23 ENCOUNTER — Other Ambulatory Visit: Payer: Self-pay

## 2016-06-23 VITALS — BP 128/56 | HR 47 | Temp 97.3°F | Resp 17 | Ht 66.0 in | Wt 140.8 lb

## 2016-06-23 DIAGNOSIS — E1122 Type 2 diabetes mellitus with diabetic chronic kidney disease: Secondary | ICD-10-CM

## 2016-06-23 DIAGNOSIS — E039 Hypothyroidism, unspecified: Secondary | ICD-10-CM | POA: Diagnosis not present

## 2016-06-23 DIAGNOSIS — I5033 Acute on chronic diastolic (congestive) heart failure: Secondary | ICD-10-CM | POA: Diagnosis not present

## 2016-06-23 DIAGNOSIS — J690 Pneumonitis due to inhalation of food and vomit: Secondary | ICD-10-CM | POA: Diagnosis not present

## 2016-06-23 DIAGNOSIS — N183 Chronic kidney disease, stage 3 unspecified: Secondary | ICD-10-CM

## 2016-06-23 DIAGNOSIS — I2510 Atherosclerotic heart disease of native coronary artery without angina pectoris: Secondary | ICD-10-CM | POA: Diagnosis not present

## 2016-06-23 DIAGNOSIS — I10 Essential (primary) hypertension: Secondary | ICD-10-CM | POA: Diagnosis not present

## 2016-06-23 DIAGNOSIS — L89151 Pressure ulcer of sacral region, stage 1: Secondary | ICD-10-CM

## 2016-06-23 DIAGNOSIS — R001 Bradycardia, unspecified: Secondary | ICD-10-CM | POA: Diagnosis not present

## 2016-06-23 DIAGNOSIS — Z794 Long term (current) use of insulin: Secondary | ICD-10-CM

## 2016-06-23 DIAGNOSIS — J189 Pneumonia, unspecified organism: Secondary | ICD-10-CM | POA: Diagnosis not present

## 2016-06-23 DIAGNOSIS — I130 Hypertensive heart and chronic kidney disease with heart failure and stage 1 through stage 4 chronic kidney disease, or unspecified chronic kidney disease: Secondary | ICD-10-CM | POA: Diagnosis not present

## 2016-06-23 LAB — CULTURE, BLOOD (ROUTINE X 2)
Culture: NO GROWTH
Culture: NO GROWTH

## 2016-06-23 NOTE — Progress Notes (Signed)
Careteam: Patient Care Team: Gildardo Cranker, DO as PCP - General (Internal Medicine) Riccardo Dubin, MD as Referring Physician (Cardiology) Alveta Heimlich, MD as Referring Physician (Ophthalmology) Kandra Nicolas Randal Buba, RN as [facility] Management  Advanced Directive information Does Patient Have a Medical Advance Directive?: Yes, Type of Advance Directive: Healthcare Power of Attorney  Allergies  Allergen Reactions  . Penicillins Itching, Rash and Other (See Comments)    Tolerated unasyn, zosyn & cephalosporins in the past.  Has patient had a PCN reaction causing immediate rash, facial/tongue/throat swelling, SOB or lightheadedness with hypotension: Yes Has patient had a PCN reaction causing severe rash involving mucus membranes or skin necrosis: Unk Has patient had a PCN reaction that required hospitalization: Unk Has patient had a PCN reaction occurring within the last 10 years: Unk If all of the above answers are "NO", then may proceed with Cephalosporins    Chief Complaint  Patient presents with  . Transitions Of Care    Pt had recent hospital stay at [facility] from 06/08/16 to 06/16/16     HPI: Patient is a 83 y.o. female seen in the office today for hospital follow up. Pt was hospitalized  from 06/08/16-06/16/16 due to bradycardia. Pt with hx of asthma, bradycardia, CAD, Diastolic HF, CKD stage III, COPD, DVT, GERD, HTN, DM type 2, gastric outlet obstruction s/p G-tube placement, and hyperlipidemia.  Pt was noted to have HAP vs aspiration. Pt with history of dysphagia and on dysphagia 2 diet with nectar thick liquids, getting tube feeding for gastric outlet obstruction. -Started on meropenem and vancomycin due to PCN allergies which was switched to Augmentin (bc she tolerated zosyn on previous admission), discharged on Augmentin for 4 more days. Which she has completed, she was diagnosed with yeast in blood and went to ED for further  testing which was negative however they went ahead and treated her with diflucan for 14 days.  Coumadin clinic aware she is on diflucan.  Follow up with clinic on Monday.  Pt was recently started on Cardizem and bisoprolol, she was placed on dopamine and glucagon infusion for bradycardia and HR stabilized. Reports at home her HR has been in the 70s, 40s today.  Patient and daughter do not wish to pursue a pacemaker due to age.  She has hx of a fib, following with coumadin clinic for INR.  Diabetes- blood sugars were elevated in hospital due to glucagon drip, insulin was adjusted. Blood sugars have been doing good at home. cbg 170s, 140s  Blood pressure- poorly controlled in hospital, had to stay extra during hospitalization for HTN- better today.  TSH was elevated- no adjustment made to medications. will need follow up Thyroid function test palliative care consult was obtained in the past but daughter reports they did not have a good experience with them in the past.  Pressure ulcer noted on sacrum prior to discharge, this has improved.  Review of Systems:  Review of Systems  Constitutional: Negative for activity change, fatigue and unexpected weight change.  HENT: Negative for congestion.   Eyes: Negative.   Respiratory: Negative for cough and shortness of breath.   Cardiovascular: Positive for leg swelling. Negative for chest pain and palpitations.  Gastrointestinal: Negative for abdominal pain, constipation and diarrhea.  Genitourinary: Negative for difficulty urinating and dysuria.  Musculoskeletal: Positive for neck pain. Negative for arthralgias and myalgias.  Skin: Negative for color change and wound.  Neurological: Positive for weakness. Negative for dizziness.  Psychiatric/Behavioral:  Negative for agitation, behavioral problems and confusion.    Past Medical History:  Diagnosis Date  . Anemia    previous blood transfusions  . Arthritis    "all over"  . Asthma   . Bradycardia     requiring previous d/c of BB and reduction of amiodarone  . CAD (coronary artery disease)    nonobstructive per notes  . Chronic diastolic CHF (congestive heart failure) ([place])   . CKD (chronic kidney disease), stage III   . Complication of blood transfusion    "got the wrong blood type at [facility] in ~ 2015; no adverse reaction that we are aware of"/daughter, [name] (01/27/2016)  . COPD (chronic obstructive pulmonary disease) ([place])   . Depression    "light case"  . DVT (deep venous thrombosis) ([place]) 01/2016   a. LLE DVT 01/2016 - switched from Eliquis to Coumadin.  . Gastric stenosis    a. s/p stomach tube  . GERD (gastroesophageal reflux disease)   . History of blood transfusion    "several" (01/27/2016)  . History of stomach ulcers   . Hyperlipidemia   . Hypertension   . Hypothyroidism   . Paraesophageal hernia   . Perforated gastric ulcer ([place])   . Seasonal allergies   . SIADH (syndrome of inappropriate ADH production) ([place])    [name] 01/10/2015  . Small bowel obstruction    "I don't know how many" (01/11/2015)  . Stroke ([place])    "light one"  . Type II diabetes mellitus ([place])    "related to prednisone use  for > 20 yr; once predinose stopped; no more DM RX" (01/27/2016)  . Ventral hernia with bowel obstruction    Past Surgical History:  Procedure Laterality Date  . CATARACT EXTRACTION W/ INTRAOCULAR LENS  IMPLANT, BILATERAL    . CHOLECYSTECTOMY OPEN    . COLECTOMY    . ESOPHAGOGASTRODUODENOSCOPY N/A 01/19/2014   Procedure: ESOPHAGOGASTRODUODENOSCOPY (EGD);  Surgeon: Irene Shipper, MD;  Location: [facility];  Service: Endoscopy;  Laterality: N/A;  . ESOPHAGOGASTRODUODENOSCOPY N/A 01/20/2014   Procedure: ESOPHAGOGASTRODUODENOSCOPY (EGD);  Surgeon: Irene Shipper, MD;  Location: [facility];  Service: Endoscopy;  Laterality: N/A;  . ESOPHAGOGASTRODUODENOSCOPY N/A 03/19/2014   Procedure: ESOPHAGOGASTRODUODENOSCOPY (EGD);  Surgeon: Milus Banister, MD;  Location: [facility]  ENDOSCOPY;  Service: Endoscopy;  Laterality: N/A;  . ESOPHAGOGASTRODUODENOSCOPY N/A 07/08/2015   Procedure: ESOPHAGOGASTRODUODENOSCOPY (EGD);  Surgeon: Doran Stabler, MD;  Location: [facility] ENDOSCOPY;  Service: Endoscopy;  Laterality: N/A;  . ESOPHAGOGASTRODUODENOSCOPY (EGD) WITH PROPOFOL N/A 09/15/2015   Procedure: ESOPHAGOGASTRODUODENOSCOPY (EGD) WITH PROPOFOL;  Surgeon: Manus Gunning, MD;  Location: [facility] ENDOSCOPY;  Service: Gastroenterology;  Laterality: N/A;  . GASTROJEJUNOSTOMY     hx/notes 01/10/2015  . GASTROJEJUNOSTOMY N/A 09/23/2015   Procedure: OPEN GASTROJEJUNOSTOMY TUBE PLACEMENT;  Surgeon: Arta Bruce Kinsinger, MD;  Location: [facility] ORS;  Service: General;  Laterality: N/A;  . GLAUCOMA SURGERY Bilateral   . HERNIA REPAIR  2015  . IR GENERIC HISTORICAL  01/07/2016   IR GJ TUBE CHANGE 01/07/2016 Jacqulynn Cadet, MD [facility]-INTER[facility] RAD  . IR GENERIC HISTORICAL  01/27/2016   IR MECH REMOV OBSTRUC MAT ANY COLON TUBE W/FLUORO 01/27/2016 Markus Daft, MD [facility]  . IR GENERIC HISTORICAL  02/07/2016   IR PATIENT EVAL TECH 0-60 MINS Darrell K Allred, PA-C [facility]INTER[facility]  . IR GENERIC HISTORICAL  02/08/2016   IR GJ TUBE CHANGE 02/08/2016 Greggory Keen, MD [facility]  . IR GENERIC HISTORICAL  01/06/2016   IR GJ  TUBE CHANGE 01/06/2016 [facility]-RAD OUT REF  . IR GENERIC HISTORICAL  05/02/2016   IR CM INJ ANY COLONIC TUBE W/FLUORO 05/02/2016 Arne Cleveland, MD [facility]  . IR GENERIC HISTORICAL  05/15/2016   IR GJ TUBE CHANGE 05/15/2016 Sandi Mariscal, MD [facility]  . LAPAROTOMY N/A 01/20/2015   Procedure: EXPLORATORY LAPAROTOMY;  Surgeon: Coralie Keens, MD;  Location: [facility];  Service: General;  Laterality: N/A;  . LYSIS OF ADHESION N/A 01/20/2015   Procedure: LYSIS OF ADHESIONS < 1 HOUR;  Surgeon: Coralie Keens, MD;  Location: [facility];  Service: General;  Laterality: N/A;  . TONSILLECTOMY    . TUBAL LIGATION    . VENTRAL HERNIA REPAIR  2015   incarcerated ventral hernia ([facility] 09/2013)/notes  01/10/2015   Social History:   reports that she has never smoked. She has quit using smokeless tobacco. Her smokeless tobacco use included Snuff. She reports that she does not drink alcohol or use drugs.  Family History  Problem Relation Age of Onset  . Stroke Mother   . Hypertension Mother   . Diabetes Brother   . Heart attack Neg Hx     Medications: Patient's Medications  New Prescriptions   No medications on file  Previous Medications   ACETAMINOPHEN (TYLENOL) 325 MG TABLET    Take 325-650 mg by mouth 2 (two) times daily as needed for mild pain.    AMINO ACIDS-PROTEIN HYDROLYS PO    Take 30 mLs by mouth 2 (two) times daily.   AMLODIPINE (NORVASC) 10 MG TABLET    Take 1 tablet (10 mg total) by mouth daily.   ATORVASTATIN (LIPITOR) 10 MG TABLET    TAKE ONE TABLET BY MOUTH ONCE DAILY   B COMPLEX VITAMINS TABLET    Take 1 tablet by mouth daily.    BACITRACIN-POLYMYXIN B (POLYSPORIN) OPHTHALMIC OINTMENT    Place 1 application into both eyes 2 (two) times daily. apply to eye every 12 hours while awake   BISACODYL (DULCOLAX) 10 MG SUPPOSITORY    Place 1 suppository (10 mg total) rectally daily as needed for moderate constipation.   CALCIUM-VITAMIN D (OSCAL WITH D) 500-200 MG-UNIT PER TABLET    Take 1 tablet by mouth daily with breakfast.    CHLORHEXIDINE (PERIDEX) 0.12 % SOLUTION    Use as directed 15 mLs in the mouth or throat 2 (two) times daily.   CHLORPHEN-PE-ACETAMINOPHEN (SINUS CONGESTION/PAIN DAY/NGHT PO)    Take 1 capsule by mouth daily as needed (for sinus irritation).    FEEDING SUPPLEMENT, GLUCERNA SHAKE, (GLUCERNA SHAKE) LIQD    Take 237 mLs by mouth daily.    FLUCONAZOLE (DIFLUCAN) 200 MG TABLET    Take 1 tablet (200 mg total) by mouth daily.   FLUTICASONE (FLONASE) 50 MCG/ACT NASAL SPRAY    Place 2 sprays into both nostrils daily.   FUROSEMIDE (LASIX) 20 MG TABLET    Take 1 tablet (20 mg total) by mouth daily.   GABAPENTIN (NEURONTIN) 300 MG CAPSULE    Take 1 capsule (300 mg  total) by mouth at bedtime.   INSULIN ASPART (NOVOLOG FLEXPEN) 100 UNIT/ML FLEXPEN    Insulin sliding scale: Blood sugar  120-150   3units                       151-200   4units                       201-250   7units  251- 300  11units                       301-350   15uints                       351-400   20units                       >400         call MD immediately   INSULIN GLARGINE (LANTUS SOLOSTAR) 100 UNIT/ML SOLOSTAR PEN    Inject 5 Units into the skin daily at 10 pm.   IPRATROPIUM-ALBUTEROL (DUONEB) 0.5-2.5 (3) MG/3ML SOLN    Take 3 mLs by nebulization every 6 (six) hours as needed (sob). Use 3 times daily x 4 days then every 6 hours as needed.   LATANOPROST (XALATAN) 0.005 % OPHTHALMIC SOLUTION    Place 1 drop into both eyes at bedtime.   LEVALBUTEROL (XOPENEX HFA) 45 MCG/ACT INHALER    Inhale 2 puffs into the lungs every 6 (six) hours as needed for wheezing or shortness of breath.   LEVOTHYROXINE (SYNTHROID, LEVOTHROID) 137 MCG TABLET    TAKE 1 TABLET (137 MCG TOTAL) BY MOUTH DAILY BEFORE BREAKFAST.   LOSARTAN (COZAAR) 50 MG TABLET    Take 1 tablet (50 mg total) by mouth daily.   MALTODEXTRIN-XANTHAN GUM (RESOURCE THICKENUP CLEAR) POWD    Take 120 g by mouth as needed (use to thicken liquids.).   MULTIPLE VITAMIN (MULTIVITAMIN WITH MINERALS) TABS TABLET    Take 1 tablet by mouth daily.   NUTRITIONAL SUPPLEMENTS (FEEDING SUPPLEMENT, OSMOLITE 1.5 CAL,) LIQD    Place 1,000 mLs into feeding tube daily.   POLYETHYL GLYCOL-PROPYL GLYCOL 0.4-0.3 % SOLN    Place 1 drop into both eyes 4 (four) times daily.    POLYETHYLENE GLYCOL (MIRALAX / GLYCOLAX) PACKET    Take 17 g by mouth daily as needed.   RESTASIS 0.05 % OPHTHALMIC EMULSION    USE 1 DROP INTO BOTH EYES TWICE DAILY   SERTRALINE (ZOLOFT) 100 MG TABLET    TAKE ONE TABLET BY MOUTH ONCE DAILY FOR DEPRESSION   TRAMADOL (ULTRAM) 50 MG TABLET    Take 50 mg by mouth See admin instructions. Every 4-6 hours as needed for  pain   WARFARIN (COUMADIN) 2.5 MG TABLET    TAKE AS DIRECTED BY COUMADIN CLINIC.   WATER FOR IRRIGATION, STERILE (FREE WATER) SOLN    Place 230 mLs into feeding tube 3 (three) times daily.  Modified Medications   No medications on file  Discontinued Medications   No medications on file     Physical Exam:  Vitals:   06/23/16 1308  BP: (!) 128/56  Pulse: (!) 47  Resp: 17  Temp: 97.3 F (36.3 C)  TempSrc: Oral  SpO2: 98%  Weight: 140 lb 12.8 oz (63.9 kg)  Height: 5\' 6"  (1.676 m)   Body mass index is 22.73 kg/m.  Physical Exam  Constitutional: She is oriented to person, place, and time. She appears well-developed and well-nourished. No distress.  HENT:  Head: Normocephalic and atraumatic.  Mouth/Throat: Oropharynx is clear and moist. No oropharyngeal exudate.  Eyes: Conjunctivae are normal. Pupils are equal, round, and reactive to light.  Neck: Normal range of motion. Neck supple.  Cardiovascular: Regular rhythm and normal heart sounds.  Bradycardia present.   Pulmonary/Chest: Effort normal and breath sounds normal.  Abdominal: Soft. Bowel sounds are normal.  Musculoskeletal: She exhibits edema. She exhibits no tenderness.  Neurological: She is alert and oriented to person, place, and time.  Skin: Skin is warm and dry. She is not diaphoretic.  Psychiatric: She has a normal mood and affect.    Labs reviewed: Basic Metabolic Panel:  Recent Labs  09/16/15 0322  03/11/16 0354  05/08/16 1807  06/08/16 1523  06/08/16 2359 06/10/16 0317  06/13/16 0346 06/15/16 0400 06/18/16 1607  NA 137  < > 130*  < >  --   < > 135  < > 138 134*  < > 138 136 136  K 3.5  < > 4.1  < >  --   < > 4.4  < > 3.9 3.5  < > 4.3 4.7 4.7  CL 110  < > 88*  < >  --   < > 99*  < > 103 100*  < > 106 108 106  CO2 22  < > 28  < >  --   < > 29  --  26 24  < > 22 20* 23  GLUCOSE 83  < > 183*  < >  --   < > 148*  < > 191* 142*  < > 166* 110* 97  BUN 7  < > 50*  < >  --   < > 54*  < > 49* 52*  < > 32*  35* 35*  CREATININE 1.01*  < > 1.34*  < >  --   < > 1.97*  < > 1.60* 1.30*  < > 1.15* 1.71* 1.41*  CALCIUM 8.0*  < > 9.1  < >  --   < > 8.7*  --  9.3 8.7*  < > 8.8* 8.2* 9.4  MG 1.7  < >  --   --   --   < > 1.9  --  1.9 2.1  --   --   --   --   PHOS 1.8*  --   --   --   --   --   --   --  3.3 3.3  --   --   --   --   TSH  --   < > 5.642*  --  7.468*  --  10.017*  --   --   --   --   --   --   --   < > = values in this interval not displayed. Liver Function Tests:  Recent Labs  05/28/16 0519 06/08/16 1523 06/18/16 1607  AST 32 25 21  ALT 52 24 25  ALKPHOS 72 53 71  BILITOT 0.3 0.6 0.4  PROT 6.1* 5.4* 6.6  ALBUMIN 2.8* 2.8* 3.1*    Recent Labs  08/26/15 0630 09/11/15 2351 11/06/15 1612  LIPASE 18 81* 21   No results for input(s): AMMONIA in the last 8760 hours. CBC:  Recent Labs  06/08/16 1523  06/13/16 2215  06/15/16 0400 06/16/16 0632 06/18/16 1607  WBC 6.0  < > 8.3  < > 13.6* 8.7 7.5  NEUTROABS 3.5  --  8.0*  --   --   --  4.3  HGB 9.6*  < > 10.8*  < > 9.3* 9.6* 10.4*  HCT 28.5*  < > 32.1*  < > 27.8* 28.4* 31.0*  MCV 83.8  < > 84.9  < > 84.8 85.5 85.2  PLT 176  < > 130*  < > 123* 149* 238  < > = values in this  interval not displayed. Lipid Panel: No results for input(s): CHOL, HDL, LDLCALC, TRIG, CHOLHDL, LDLDIRECT in the last 8760 hours. TSH:  Recent Labs  03/11/16 0354 05/08/16 1807 06/08/16 1523  TSH 5.642* 7.468* 10.017*   A1C: Lab Results  Component Value Date   HGBA1C 8.0 (H) 05/30/2016   Lab Results  Component Value Date   INR 1.6 06/21/2016   INR 1.5 06/19/2016   INR 1.33 06/16/2016     Assessment/Plan 1. Essential hypertension, benign Stable, to cont on amlodipine, Imdur and losartan.  2. Bradycardia Not a candidate for pacemaker, to avoid AV nodal slowing medication.  3. Aspiration pneumonia due to vomit, unspecified laterality, unspecified part of lung ([place]) Occurred during hospitalization, pt has completed antibiotic  4.  Type 2 diabetes mellitus with stage 3 chronic kidney disease, with long-term current use of insulin (HCC) To cont on lantus, daughter managing blood sugars and insulin, no further hypoglycemic epidsodes  5. Hypothyroidism, unspecified type -no recent adjustments in synthroid, tsh elevated in hospital will follow. - TSH; Future  6. Decubitus ulcer of sacral region, stage 1 Has significantly improved, pressure reduction with good protein encouraged.   7. A fib  Stable, not on medication for rate due to bradycardia, coumadin clinic managing INR, following up closely due to being on diflucan.   Follow up in 6 weeks. Sooner if needed  [employer]. Harle Battiest  [facility] [phone] 8 am - 5 pm) [phone] (after hours)

## 2016-06-23 NOTE — Patient Instructions (Signed)
4 week for lab

## 2016-06-23 NOTE — Patient Outreach (Signed)
[facility]) Care Management  06/23/2016  Shannon [name] 1924-03-31 [id number]       EMMI-HF RED ON EMMI ALERT Day # 2 Date: 06/22/16 Red Alert Reason: " Weighed themselves today? No"    Outreach attempt #1 to patient. Spoke with dtr-Shannon [name]. She was about to leave and requested to contact RN CM back later.     Plan: RN CM will make outreach attempt to patient/dtr within one business day if no return call from pt/dtr.   Enzo Montgomery, RN,BSN,CCM [place] Management Telephonic Care Management Coordinator Direct Phone: [phone] Toll Free: [phone] Fax: [phone]

## 2016-06-26 ENCOUNTER — Ambulatory Visit (INDEPENDENT_AMBULATORY_CARE_PROVIDER_SITE_OTHER): Payer: Medicare Other | Admitting: Cardiovascular Disease

## 2016-06-26 ENCOUNTER — Other Ambulatory Visit: Payer: Self-pay

## 2016-06-26 DIAGNOSIS — N183 Chronic kidney disease, stage 3 (moderate): Secondary | ICD-10-CM | POA: Diagnosis not present

## 2016-06-26 DIAGNOSIS — I2510 Atherosclerotic heart disease of native coronary artery without angina pectoris: Secondary | ICD-10-CM | POA: Diagnosis not present

## 2016-06-26 DIAGNOSIS — H527 Unspecified disorder of refraction: Secondary | ICD-10-CM | POA: Diagnosis not present

## 2016-06-26 DIAGNOSIS — I824Y2 Acute embolism and thrombosis of unspecified deep veins of left proximal lower extremity: Secondary | ICD-10-CM

## 2016-06-26 DIAGNOSIS — E1122 Type 2 diabetes mellitus with diabetic chronic kidney disease: Secondary | ICD-10-CM | POA: Diagnosis not present

## 2016-06-26 DIAGNOSIS — J189 Pneumonia, unspecified organism: Secondary | ICD-10-CM | POA: Diagnosis not present

## 2016-06-26 DIAGNOSIS — H16143 Punctate keratitis, bilateral: Secondary | ICD-10-CM | POA: Diagnosis not present

## 2016-06-26 DIAGNOSIS — Z961 Presence of intraocular lens: Secondary | ICD-10-CM | POA: Diagnosis not present

## 2016-06-26 DIAGNOSIS — Z5181 Encounter for therapeutic drug level monitoring: Secondary | ICD-10-CM

## 2016-06-26 DIAGNOSIS — I130 Hypertensive heart and chronic kidney disease with heart failure and stage 1 through stage 4 chronic kidney disease, or unspecified chronic kidney disease: Secondary | ICD-10-CM | POA: Diagnosis not present

## 2016-06-26 DIAGNOSIS — I4892 Unspecified atrial flutter: Secondary | ICD-10-CM

## 2016-06-26 DIAGNOSIS — I5033 Acute on chronic diastolic (congestive) heart failure: Secondary | ICD-10-CM | POA: Diagnosis not present

## 2016-06-26 DIAGNOSIS — H16223 Keratoconjunctivitis sicca, not specified as Sjogren's, bilateral: Secondary | ICD-10-CM | POA: Diagnosis not present

## 2016-06-26 LAB — POCT INR: INR: >8

## 2016-06-26 NOTE — Patient Outreach (Signed)
[facility]) Care Management  06/26/2016  Shannon [name] [birth year] [id number]   EMMI-HF RED ON EMMI ALERT Day # 2 Date: 06/22/16 Red Alert Reason: " Weighed themselves today? No"    Outreach attempt #2 to patient. Spoke with dtr-[name]. Dtr states that patient is doing very well. She reports that patient is weighing daily. Weight has been stable at 136-137 lbs. Patient has some mild LLE edema but states that this has been ongoing and not worse. Patient continues to get tube feedings. Blood sugars have been managed and ranging around 160s-170s. Dtr states she plans to discuss with [facility] and MD about possible having insulin dosage adjusted. She reports that patient has been able to eat some small amounts of soft foods likes soup, jello and yogurt. Dtr voices that she is so pleased and shocked at how patient's overall progress and condition has improved. Patient is up walking and moving around some. Dtr took patient to MD f/u appt on last week and got a good report. Dtr denies any issues for RN CM at this time. Advised that patient would continue to get automated EMMI calls and will receive a call from a nurse if any of her responses are abnormal.     Plan: RN CM will notify [facility] administrative assistant of case status.   Enzo Montgomery, RN,BSN,CCM [place] Management Telephonic Care Management Coordinator Direct Phone: [phone] Toll Free: [phone] Fax: [phone]

## 2016-06-27 ENCOUNTER — Encounter: Payer: Self-pay | Admitting: Physician Assistant

## 2016-06-27 ENCOUNTER — Ambulatory Visit (INDEPENDENT_AMBULATORY_CARE_PROVIDER_SITE_OTHER): Payer: Medicare Other | Admitting: Physician Assistant

## 2016-06-27 VITALS — Ht 66.0 in | Wt 141.8 lb

## 2016-06-27 DIAGNOSIS — I10 Essential (primary) hypertension: Secondary | ICD-10-CM | POA: Diagnosis not present

## 2016-06-27 DIAGNOSIS — I5032 Chronic diastolic (congestive) heart failure: Secondary | ICD-10-CM | POA: Diagnosis not present

## 2016-06-27 DIAGNOSIS — R001 Bradycardia, unspecified: Secondary | ICD-10-CM | POA: Diagnosis not present

## 2016-06-27 DIAGNOSIS — N183 Chronic kidney disease, stage 3 (moderate): Secondary | ICD-10-CM | POA: Diagnosis not present

## 2016-06-27 DIAGNOSIS — I4819 Other persistent atrial fibrillation: Secondary | ICD-10-CM

## 2016-06-27 DIAGNOSIS — I5033 Acute on chronic diastolic (congestive) heart failure: Secondary | ICD-10-CM | POA: Diagnosis not present

## 2016-06-27 DIAGNOSIS — J189 Pneumonia, unspecified organism: Secondary | ICD-10-CM | POA: Diagnosis not present

## 2016-06-27 DIAGNOSIS — E1122 Type 2 diabetes mellitus with diabetic chronic kidney disease: Secondary | ICD-10-CM | POA: Diagnosis not present

## 2016-06-27 DIAGNOSIS — I481 Persistent atrial fibrillation: Secondary | ICD-10-CM

## 2016-06-27 DIAGNOSIS — I2510 Atherosclerotic heart disease of native coronary artery without angina pectoris: Secondary | ICD-10-CM | POA: Diagnosis not present

## 2016-06-27 DIAGNOSIS — I130 Hypertensive heart and chronic kidney disease with heart failure and stage 1 through stage 4 chronic kidney disease, or unspecified chronic kidney disease: Secondary | ICD-10-CM | POA: Diagnosis not present

## 2016-06-27 LAB — BASIC METABOLIC PANEL
BUN/Creatinine Ratio: 42 — ABNORMAL HIGH (ref 12–28)
BUN: 46 mg/dL — ABNORMAL HIGH (ref 10–36)
CO2: 22 mmol/L (ref 18–29)
Calcium: 9.2 mg/dL (ref 8.7–10.3)
Chloride: 97 mmol/L (ref 96–106)
Creatinine, Ser: 1.1 mg/dL — ABNORMAL HIGH (ref 0.57–1.00)
GFR calc Af Amer: 51 mL/min/{1.73_m2} — ABNORMAL LOW (ref >59)
GFR calc non Af Amer: 44 mL/min/{1.73_m2} — ABNORMAL LOW (ref >59)
Glucose: 101 mg/dL — ABNORMAL HIGH (ref 65–99)
Potassium: 5.6 mmol/L — ABNORMAL HIGH (ref 3.5–5.2)
Sodium: 134 mmol/L (ref 134–144)

## 2016-06-27 LAB — CBC
Hematocrit: 32 % — ABNORMAL LOW (ref 34.0–46.6)
Hemoglobin: 10.5 g/dL — ABNORMAL LOW (ref 11.1–15.9)
MCH: 29.3 pg (ref 26.6–33.0)
MCHC: 32.8 g/dL (ref 31.5–35.7)
MCV: 89 fL (ref 79–97)
Platelets: 368 10*3/uL (ref 150–379)
RBC: 3.58 x10E6/uL — ABNORMAL LOW (ref 3.77–5.28)
RDW: 21.1 % — ABNORMAL HIGH (ref 12.3–15.4)
WBC: 6.7 10*3/uL (ref 3.4–10.8)

## 2016-06-27 NOTE — Patient Instructions (Addendum)
Medication Instructions:  Your physician has recommended you make the following change in your medication:   Labwork: TODAY:  BMET & CBC  Testing/Procedures: None ordered  Follow-Up: Your physician recommends that you schedule a follow-up appointment in: 4-6 [place]   Any Other Special Instructions Will Be Listed Below (If Applicable).   If you need a refill on your cardiac medications before your next appointment, please call your pharmacy.

## 2016-06-27 NOTE — Progress Notes (Signed)
Cardiology Office Note    Date:  06/27/2016   ID:  Shannon [name], DOB [birth year], MRN [id number]  PCP:  Kirt Boys, DO  Cardiologist: Dr. Delton See  Chief Complaint  Patient presents with  . Follow-up    History of Present Illness:  Shannon [name] is a 83 y.o. female with hypertension, hyperlipidemia, previous stroke, COPD and asthma, chronic atrial fibrillation, type 2 diabetes mellitus, hypothyroidism, gastric outlet obstruction with previous PEG tube, and diastolic heart failure.   She was admitted with near-syncope and bradycardia.Heart rate was in the 30s to 40s. Patient had been on Cardizem and bisoprolol. Amiodarone was stopped 05/2016 for suspected lung toxicity. She was placed on dopamine and glucagon drips and all AV nodal agents were held. She remained bradycardic in the 50s and did not want a pacemaker. CHADSVASC=9 on Coumadin. Also had acute on chronic diastolic CHF with BNP 1268, LVEF 60-65%, HTN-Cozaar increased. Also developed pneumonia and D/Ced on Augmentin.1 BC came back positive for yeast and given Diflucan for 2 weeks. INR yest was 8.0 so coumadin on hold.  Patient comes in today accompanied by her daughter. Overall they feel like she is doing well. She has had no further dizziness or presyncope. Her heart rates been in the 50s at home. Blood pressure was 148 systolic at home this morning and is 124 systolic here. She has not taken her medications yet. She denies any bleeding, black stools. She is weak and has chronic dyspnea on exertion.    Past Medical History:  Diagnosis Date  . Anemia    previous blood transfusions  . Arthritis    "all over"  . Asthma   . Bradycardia    requiring previous d/c of BB and reduction of amiodarone  . CAD (coronary artery disease)    nonobstructive per notes  . Chronic diastolic CHF (congestive heart failure) (HCC)   . CKD (chronic kidney disease), stage III   . Complication of blood transfusion    "got the wrong blood type at  [facility] in ~ 2015; no adverse reaction that we are aware of"/daughter, Shannon [name] (01/27/2016)  . COPD (chronic obstructive pulmonary disease) (HCC)   . Depression    "light case"  . DVT (deep venous thrombosis) (HCC) 01/2016   a. LLE DVT 01/2016 - switched from Eliquis to Coumadin.  . Gastric stenosis    a. s/p stomach tube  . GERD (gastroesophageal reflux disease)   . History of blood transfusion    "several" (01/27/2016)  . History of stomach ulcers   . Hyperlipidemia   . Hypertension   . Hypothyroidism   . Paraesophageal hernia   . Perforated gastric ulcer (HCC)   . Seasonal allergies   . SIADH (syndrome of inappropriate ADH production) (HCC)    [name] 01/10/2015  . Small bowel obstruction    "I don't know how many" (01/11/2015)  . Stroke (HCC)    "light one"  . Type II diabetes mellitus (HCC)    "related to prednisone use  for > 20 yr; once predinose stopped; no more DM RX" (01/27/2016)  . Ventral hernia with bowel obstruction     Past Surgical History:  Procedure Laterality Date  . CATARACT EXTRACTION W/ INTRAOCULAR LENS  IMPLANT, BILATERAL    . CHOLECYSTECTOMY OPEN    . COLECTOMY    . ESOPHAGOGASTRODUODENOSCOPY N/A 01/19/2014   Procedure: ESOPHAGOGASTRODUODENOSCOPY (EGD);  Surgeon: Hilarie Fredrickson, MD;  Location: [facility];  Service: Endoscopy;  Laterality: N/A;  . ESOPHAGOGASTRODUODENOSCOPY N/A 01/20/2014  Procedure: ESOPHAGOGASTRODUODENOSCOPY (EGD);  Surgeon: Hilarie Fredrickson, MD;  Location: [facility];  Service: Endoscopy;  Laterality: N/A;  . ESOPHAGOGASTRODUODENOSCOPY N/A 03/19/2014   Procedure: ESOPHAGOGASTRODUODENOSCOPY (EGD);  Surgeon: Rachael Fee, MD;  Location: [facility];  Service: Endoscopy;  Laterality: N/A;  . ESOPHAGOGASTRODUODENOSCOPY N/A 07/08/2015   Procedure: ESOPHAGOGASTRODUODENOSCOPY (EGD);  Surgeon: Sherrilyn Rist, MD;  Location: [facility] ENDOSCOPY;  Service: Endoscopy;  Laterality: N/A;  . ESOPHAGOGASTRODUODENOSCOPY (EGD) WITH PROPOFOL N/A  09/15/2015   Procedure: ESOPHAGOGASTRODUODENOSCOPY (EGD) WITH PROPOFOL;  Surgeon: Ruffin Frederick, MD;  Location: [facility] ENDOSCOPY;  Service: Gastroenterology;  Laterality: N/A;  . GASTROJEJUNOSTOMY     hx/notes 01/10/2015  . GASTROJEJUNOSTOMY N/A 09/23/2015   Procedure: OPEN GASTROJEJUNOSTOMY TUBE PLACEMENT;  Surgeon: De Blanch Kinsinger, MD;  Location: [facility] ORS;  Service: General;  Laterality: N/A;  . GLAUCOMA SURGERY Bilateral   . HERNIA REPAIR  2015  . IR GENERIC HISTORICAL  01/07/2016   IR GJ TUBE CHANGE 01/07/2016 Malachy Moan, MD [facility]  . IR GENERIC HISTORICAL  01/27/2016   IR MECH REMOV OBSTRUC MAT ANY COLON TUBE W/FLUORO 01/27/2016 Richarda Overlie, MD [facility]  . IR GENERIC HISTORICAL  02/07/2016   IR PATIENT EVAL TECH 0-60 MINS Darrell K Allred, PA-C [facility]  . IR GENERIC HISTORICAL  02/08/2016   IR GJ TUBE CHANGE 02/08/2016 Berdine Dance, MD [facility]  . IR GENERIC HISTORICAL  01/06/2016   IR GJ TUBE CHANGE 01/06/2016 [facility]-RAD OUT REF  . IR GENERIC HISTORICAL  05/02/2016   IR CM INJ ANY COLONIC TUBE W/FLUORO 05/02/2016 Oley Balm, MD [facility]  . IR GENERIC HISTORICAL  05/15/2016   IR GJ TUBE CHANGE 05/15/2016 Simonne Come, MD [facility]  . LAPAROTOMY N/A 01/20/2015   Procedure: EXPLORATORY LAPAROTOMY;  Surgeon: Abigail Miyamoto, MD;  Location: [facility] OR;  Service: General;  Laterality: N/A;  . LYSIS OF ADHESION N/A 01/20/2015   Procedure: LYSIS OF ADHESIONS < 1 HOUR;  Surgeon: Abigail Miyamoto, MD;  Location: [facility] OR;  Service: General;  Laterality: N/A;  . TONSILLECTOMY    . TUBAL LIGATION    . VENTRAL HERNIA REPAIR  2015   incarcerated ventral hernia ([facility] 09/2013)/notes 01/10/2015    Current Medications: Outpatient Medications Prior to Visit  Medication Sig Dispense Refill  . acetaminophen (TYLENOL) 325 MG tablet Take 325-650 mg by mouth 2 (two) times daily as needed for mild pain.     [name] AMINO ACIDS-PROTEIN HYDROLYS PO Take 30 mLs by mouth 2 (two) times  daily.    [name] amLODipine (NORVASC) 10 MG tablet Take 1 tablet (10 mg total) by mouth daily. 30 tablet 0  . atorvastatin (LIPITOR) 10 MG tablet TAKE ONE TABLET BY MOUTH ONCE DAILY 90 tablet 1  . b complex vitamins tablet Take 1 tablet by mouth daily.     . bacitracin-polymyxin b (POLYSPORIN) ophthalmic ointment Place 1 application into both eyes 2 (two) times daily. apply to eye every 12 hours while awake    . bisacodyl (DULCOLAX) 10 MG suppository Place 1 suppository (10 mg total) rectally daily as needed for moderate constipation. 12 suppository 0  . calcium-vitamin D (OSCAL WITH D) 500-200 MG-UNIT per tablet Take 1 tablet by mouth daily with breakfast.     . chlorhexidine (PERIDEX) 0.12 % solution Use as directed 15 mLs in the mouth or throat 2 (two) times daily.    . Chlorphen-PE-Acetaminophen (SINUS CONGESTION/PAIN DAY/NGHT PO) Take 1 capsule by mouth daily as needed (for sinus irritation).     [name]  feeding supplement, GLUCERNA SHAKE, (GLUCERNA SHAKE) LIQD Take 237 mLs by mouth daily.     . fluconazole (DIFLUCAN) 200 MG tablet Take 1 tablet (200 mg total) by mouth daily. 13 tablet 0  . fluticasone (FLONASE) 50 MCG/ACT nasal spray Place 2 sprays into both nostrils daily. 16 g 3  . furosemide (LASIX) 20 MG tablet Take 1 tablet (20 mg total) by mouth daily. 30 tablet 0  . gabapentin (NEURONTIN) 300 MG capsule Take 1 capsule (300 mg total) by mouth at bedtime. 30 capsule 0  . Insulin Glargine (LANTUS SOLOSTAR) 100 UNIT/ML Solostar Pen Inject 5 Units into the skin daily at 10 pm. 5 pen 6  . ipratropium-albuterol (DUONEB) 0.5-2.5 (3) MG/3ML SOLN Take 3 mLs by nebulization every 6 (six) hours as needed (sob). Use 3 times daily x 4 days then every 6 hours as needed. 360 mL 4  . latanoprost (XALATAN) 0.005 % ophthalmic solution Place 1 drop into both eyes at bedtime. 2.5 mL 12  . levalbuterol (XOPENEX HFA) 45 MCG/ACT inhaler Inhale 2 puffs into the lungs every 6 (six) hours as needed for wheezing or shortness  of breath. 1 Inhaler 12  . levothyroxine (SYNTHROID, LEVOTHROID) 137 MCG tablet TAKE 1 TABLET (137 MCG TOTAL) BY MOUTH DAILY BEFORE BREAKFAST. 30 tablet 2  . losartan (COZAAR) 50 MG tablet Take 1 tablet (50 mg total) by mouth daily. 30 tablet 2  . Maltodextrin-Xanthan Gum (RESOURCE THICKENUP CLEAR) POWD Take 120 g by mouth as needed (use to thicken liquids.). 20 Can 0  . Multiple Vitamin (MULTIVITAMIN WITH MINERALS) TABS tablet Take 1 tablet by mouth daily.    . Nutritional Supplements (FEEDING SUPPLEMENT, OSMOLITE 1.5 CAL,) LIQD Place 1,000 mLs into feeding tube daily. 1000 mL 3  . Polyethyl Glycol-Propyl Glycol 0.4-0.3 % SOLN Place 1 drop into both eyes 4 (four) times daily.     . RESTASIS 0.05 % ophthalmic emulsion USE 1 DROP INTO BOTH EYES TWICE DAILY 60 mL 0  . sertraline (ZOLOFT) 100 MG tablet TAKE ONE TABLET BY MOUTH ONCE DAILY FOR DEPRESSION 90 tablet 1  . traMADol (ULTRAM) 50 MG tablet Take 50 mg by mouth See admin instructions. Every 4-6 hours as needed for pain  0  . warfarin (COUMADIN) 2.5 MG tablet TAKE AS DIRECTED BY COUMADIN CLINIC. 40 tablet 1  . Water For Irrigation, Sterile (FREE WATER) SOLN Place 230 mLs into feeding tube 3 (three) times daily.    . insulin aspart (NOVOLOG FLEXPEN) 100 UNIT/ML FlexPen Insulin sliding scale: Blood sugar  120-150   3units                       151-200   4units                       201-250   7units                       251- 300  11units                       301-350   15uints                       351-400   20units                       >400         call  MD immediately (Patient not taking: Reported on 06/23/2016) 1 pen 1  . polyethylene glycol (MIRALAX / GLYCOLAX) packet Take 17 g by mouth daily as needed. (Patient taking differently: Take 17 g by mouth daily as needed for mild constipation. ) 30 each 0   No facility-administered medications prior to visit.      Allergies:   Penicillins   Social History   Social History  . Marital  status: Widowed    Spouse name: N/A  . Number of children: 3  . Years of education: N/A   Social History Main Topics  . Smoking status: Never Smoker  . Smokeless tobacco: Former Neurosurgeon    Types: Snuff     Comment: "used snuff in my younger days"  . Alcohol use No  . Drug use: No  . Sexual activity: No   Other Topics Concern  . None   Social History Narrative   Married in [place], lives in one story house with 2 people and no pets   Occupation: ?   Has a living Will, [place], and doesn't have DNR   Was living with Husband (in frail health) in [place] until 09/2013.  After her [facility] admission they both came to live with daughter in [place]     Family History:  The patient's   family history includes Diabetes in her brother; Hypertension in her mother; Stroke in her mother.   ROS:   Please see the history of present illness.    Review of Systems  Constitution: Positive for weakness and malaise/fatigue.  HENT: Negative.   Eyes: Negative.   Cardiovascular: Positive for dyspnea on exertion.  Respiratory: Negative.   Hematologic/Lymphatic: Negative.   Musculoskeletal: Positive for muscle weakness. Negative for joint pain.  Gastrointestinal: Negative.   Genitourinary: Negative.    All other systems reviewed and are negative.   PHYSICAL EXAM:   VS:  Ht 5\' 6"  (1.676 m)   Wt 141 lb 12.8 oz (64.3 kg)   BMI 22.89 kg/m   Physical Exam  GEN: Well nourished, well developed, in no acute distress  Neck: no JVD, carotid bruits, or masses Cardiac:RRR; 2/6 systolic murmur LSB, no rubs, or gallops  Respiratory: Decreased breath sounds throughout without rales or rhonchi  GI: soft, nontender, nondistended, + BS Ext: Trace of edema on the left without cyanosis, clubbing, Good distal pulses bilaterally Neuro:  Alert and Oriented x 3 Psych: euthymic mood, full affect  Wt Readings from Last 3 Encounters:  06/27/16 141 lb 12.8 oz (64.3 kg)  06/23/16 140 lb 12.8 oz (63.9 kg)  06/22/16 140  lb 4.8 oz (63.6 kg)      Studies/Labs Reviewed:   EKG:  EKG is  ordered today.  The ekg ordered today demonstrates Sinus bradycardia at 50 bpm  Recent Labs: 05/08/2016: NT-Pro BNP 1,132 06/08/2016: B Natriuretic Peptide 1,268.6; TSH 10.017 06/10/2016: Magnesium 2.1 06/18/2016: ALT 25; BUN 35; Creatinine, Ser 1.41; Hemoglobin 10.4; Platelets 238; Potassium 4.7; Sodium 136   Lipid Panel    Component Value Date/Time   CHOL 126 04/07/2015 1015   TRIG 107 04/07/2015 1015   HDL 69 04/07/2015 1015   CHOLHDL 1.8 04/07/2015 1015   CHOLHDL 1.9 12/24/2013 0456   VLDL 26 12/24/2013 0456   LDLCALC 36 04/07/2015 1015    Additional studies/ records that were reviewed today include:    Echocardiogram 05/09/2016: Study Conclusions   - Left ventricle: The cavity size was normal. Wall thickness was   normal. Systolic function was normal. The  estimated ejection   fraction was in the range of 60% to 65%. Wall motion was normal;   there were no regional wall motion abnormalities. - Aortic valve: There was trivial regurgitation. - Left atrium: The atrium was mildly dilated. Volume/bsa, ES,   (1-plane Simpson&'s, A2C): 45.6 ml/m^2. - Right atrium: The atrium was moderately dilated. - Tricuspid valve: There was moderate regurgitation. - Pulmonary arteries: Systolic pressure was mildly increased. PA   peak pressure: 31 mm Hg (S).   Impressions:   - No cardiac source of emboli was indentified   ASSESSMENT:    1. Symptomatic bradycardia   2. Persistent atrial fibrillation (HCC)   3. Essential hypertension, benign   4. Chronic diastolic CHF (congestive heart failure) (HCC)      PLAN:  In order of problems listed above:  Symptomatic bradycardia now off all AV nodal agents as well as amiodarone. Patient's heart rate staying in the 50s. Continue to monitor as an outpatient. No dizziness or presyncope at this time. Patient has declined pacemaker in the hospital. Follow-up with Dr. Delton See in 4-6  weeks.  Persistent atrial fibrillation hasn't had recurrence since AV nodal agents stopped and amiodarone. CHADSVASC=9. INR was up to 8.0 yesterday on Coumadin and Diflucan. No evidence of bleeding. We'll check CBC today. Follow up with the Coumadin clinic Friday.  Essential hypertension blood pressure was running on the high side in the past but is 124 systolic today and she has not taken any of her medications. Patient takes her blood pressure twice a day at home and it's been as high as 170 systolic. They will continue to monitor closely we can always decrease amlodipine or Cozaar if needed.  Chronic diastolic CHF well compensated on low-dose Lasix 20 mg daily. Check renal function today.    Medication Adjustments/Labs and Tests Ordered: Current medicines are reviewed at length with the patient today.  Concerns regarding medicines are outlined above.  Medication changes, Labs and Tests ordered today are listed in the Patient Instructions below. Patient Instructions  Medication Instructions:  Your physician has recommended you make the following change in your medication:   Labwork: TODAY:    Testing/Procedures: None ordered  Follow-Up: Your physician recommends that you schedule a follow-up appointment in:    Any Other Special Instructions Will Be Listed Below (If Applicable).   If you need a refill on your cardiac medications before your next appointment, please call your pharmacy.      Elson Clan, PA-C  06/27/2016 10:20 AM    [facility] [address] [place], [place], [place]  [address] Phone: [phone]; Fax: [phone]

## 2016-06-28 ENCOUNTER — Other Ambulatory Visit: Payer: Self-pay | Admitting: Physician Assistant

## 2016-06-28 ENCOUNTER — Other Ambulatory Visit: Payer: Self-pay

## 2016-06-28 ENCOUNTER — Ambulatory Visit (HOSPITAL_COMMUNITY)
Admission: RE | Admit: 2016-06-28 | Discharge: 2016-06-28 | Disposition: A | Discharge status: Home / Self Care | Payer: Medicare Other | Source: Ambulatory Visit | Attending: Radiology | Admitting: Radiology

## 2016-06-28 ENCOUNTER — Other Ambulatory Visit: Payer: Self-pay | Admitting: Internal Medicine

## 2016-06-28 ENCOUNTER — Other Ambulatory Visit (HOSPITAL_COMMUNITY): Payer: Self-pay | Admitting: Radiology

## 2016-06-28 ENCOUNTER — Encounter (HOSPITAL_COMMUNITY): Payer: Self-pay | Admitting: Interventional Radiology

## 2016-06-28 ENCOUNTER — Telehealth: Payer: Self-pay | Admitting: *Deleted

## 2016-06-28 DIAGNOSIS — Y733 Surgical instruments, materials and gastroenterology and urology devices (including sutures) associated with adverse incidents: Secondary | ICD-10-CM | POA: Insufficient documentation

## 2016-06-28 DIAGNOSIS — K9413 Enterostomy malfunction: Secondary | ICD-10-CM | POA: Insufficient documentation

## 2016-06-28 DIAGNOSIS — R1319 Other dysphagia: Secondary | ICD-10-CM

## 2016-06-28 DIAGNOSIS — E875 Hyperkalemia: Secondary | ICD-10-CM | POA: Diagnosis not present

## 2016-06-28 DIAGNOSIS — K9423 Gastrostomy malfunction: Secondary | ICD-10-CM | POA: Diagnosis not present

## 2016-06-28 HISTORY — PX: IR GENERIC HISTORICAL: IMG1180011

## 2016-06-28 MED ORDER — LOSARTAN POTASSIUM 25 MG PO TABS: 50.0000 mg | ORAL_TABLET | Freq: Every day | ORAL | 1 refills | Status: DC

## 2016-06-28 MED ORDER — IOPAMIDOL (ISOVUE-300) INJECTION 61%
INTRAVENOUS | Status: AC
  Filled 2016-06-28: qty 50

## 2016-06-28 NOTE — Addendum Note (Signed)
Addended by: Gaetano Net on: 06/28/2016 10:13 AM   Modules accepted: Orders

## 2016-06-28 NOTE — Procedures (Signed)
22 Fr Balloon GJ exchange Tip Jejunum Comp 0 EBL 0

## 2016-06-28 NOTE — Telephone Encounter (Signed)
-----   Message from Imogene Burn, PA-C sent at 06/28/2016  7:46 AM EDT ----- Kidney function better but potassium elevated. I don't see that she's taking potassium. Decrease Cozaar to 25 mg daily. Have home health repeat bmet today stat.

## 2016-06-29 ENCOUNTER — Telehealth: Payer: Self-pay | Admitting: *Deleted

## 2016-06-29 DIAGNOSIS — E875 Hyperkalemia: Secondary | ICD-10-CM

## 2016-06-29 LAB — BASIC METABOLIC PANEL
BUN: 50 mg/dL — ABNORMAL HIGH (ref 7–25)
CO2: 20 mmol/L (ref 20–31)
Calcium: 9.5 mg/dL (ref 8.6–10.4)
Chloride: 99 mmol/L (ref 98–110)
Creat: 1.36 mg/dL — ABNORMAL HIGH (ref 0.60–0.88)
Glucose, Bld: 96 mg/dL (ref 65–99)
Potassium: 5.8 mmol/L — ABNORMAL HIGH (ref 3.5–5.3)
Sodium: 133 mmol/L — ABNORMAL LOW (ref 135–146)

## 2016-06-29 MED ORDER — SODIUM POLYSTYRENE SULFONATE 15 GM/60ML PO SUSP: 15.0000 g | Freq: Once | ORAL | 0 refills | Status: AC

## 2016-06-29 NOTE — Telephone Encounter (Signed)
-----   Message from Imogene Burn, PA-C sent at 06/29/2016  9:52 AM EDT ----- Stop Cozaar, make sure she's not on potassium supplement. K 5.8 today. Tell daughter can try kayexalate at home if she is comfortable giving to patient, if not can go to ER. Kayexalate 15 g/60 ml x 1. Don't take any other meds 3 hrs before or after kayexalate.

## 2016-06-29 NOTE — Telephone Encounter (Signed)
Thank you :)

## 2016-06-29 NOTE — Telephone Encounter (Signed)
pts daughter, [name], [place] on file, has been made aware of pts K+.  She has just taken the cozaar right before I called, but will d/c after today.  [name] verified that pt IS NOT taking K+ supplement. She will give pt the Kayexalate 15g / 66ml X1 today and no medications 3 hrs prior or 3 hrs after.  Pt just took meds at 10 so she will have the Kayexalate at 1:10 pm  Spoke with Sharee Pimple, CSM, [facility], and she will get the STAT BMET tomorrow, 06/30/16, and call with results to me.

## 2016-06-30 ENCOUNTER — Telehealth: Payer: Self-pay | Admitting: Physician Assistant

## 2016-06-30 ENCOUNTER — Ambulatory Visit (INDEPENDENT_AMBULATORY_CARE_PROVIDER_SITE_OTHER): Payer: Medicare Other | Admitting: Internal Medicine

## 2016-06-30 ENCOUNTER — Telehealth: Payer: Self-pay | Admitting: *Deleted

## 2016-06-30 DIAGNOSIS — J189 Pneumonia, unspecified organism: Secondary | ICD-10-CM | POA: Diagnosis not present

## 2016-06-30 DIAGNOSIS — I130 Hypertensive heart and chronic kidney disease with heart failure and stage 1 through stage 4 chronic kidney disease, or unspecified chronic kidney disease: Secondary | ICD-10-CM | POA: Diagnosis not present

## 2016-06-30 DIAGNOSIS — E1122 Type 2 diabetes mellitus with diabetic chronic kidney disease: Secondary | ICD-10-CM | POA: Diagnosis not present

## 2016-06-30 DIAGNOSIS — J440 Chronic obstructive pulmonary disease with acute lower respiratory infection: Secondary | ICD-10-CM | POA: Diagnosis not present

## 2016-06-30 DIAGNOSIS — N183 Chronic kidney disease, stage 3 (moderate): Secondary | ICD-10-CM | POA: Diagnosis not present

## 2016-06-30 DIAGNOSIS — I5033 Acute on chronic diastolic (congestive) heart failure: Secondary | ICD-10-CM | POA: Diagnosis not present

## 2016-06-30 DIAGNOSIS — I4892 Unspecified atrial flutter: Secondary | ICD-10-CM

## 2016-06-30 DIAGNOSIS — I2510 Atherosclerotic heart disease of native coronary artery without angina pectoris: Secondary | ICD-10-CM | POA: Diagnosis not present

## 2016-06-30 DIAGNOSIS — Z5181 Encounter for therapeutic drug level monitoring: Secondary | ICD-10-CM

## 2016-06-30 DIAGNOSIS — I824Y2 Acute embolism and thrombosis of unspecified deep veins of left proximal lower extremity: Secondary | ICD-10-CM

## 2016-06-30 LAB — BASIC METABOLIC PANEL
BUN: 59 mg/dL — AB (ref 4–21)
Creatinine: 1.4 mg/dL — AB (ref 0.5–1.1)
Glucose: 91 mg/dL
Potassium: 5.7 mmol/L — AB (ref 3.4–5.3)
Sodium: 133 mmol/L — AB (ref 137–147)

## 2016-06-30 LAB — POCT INR: INR: 2.8

## 2016-06-30 NOTE — Telephone Encounter (Signed)
Shannon [name], Case Manager, was calling to let me know that the pt wasn't letting the RN come out until 2:30 today and that it will be approximately 4 hours before they get the stat lab back.  I advised Shannon [name] just to have someone call the on-call person and give them the values.  She verbalized understanding.

## 2016-06-30 NOTE — Telephone Encounter (Signed)
error 

## 2016-06-30 NOTE — Telephone Encounter (Signed)
New Message  Home health rn verbalized that she is returning call for rn   About pt STAT Lab results

## 2016-07-01 NOTE — Telephone Encounter (Signed)
Will await call back

## 2016-07-03 ENCOUNTER — Encounter: Payer: Self-pay | Admitting: *Deleted

## 2016-07-03 ENCOUNTER — Telehealth: Payer: Self-pay

## 2016-07-03 DIAGNOSIS — I130 Hypertensive heart and chronic kidney disease with heart failure and stage 1 through stage 4 chronic kidney disease, or unspecified chronic kidney disease: Secondary | ICD-10-CM | POA: Diagnosis not present

## 2016-07-03 DIAGNOSIS — N183 Chronic kidney disease, stage 3 (moderate): Secondary | ICD-10-CM | POA: Diagnosis not present

## 2016-07-03 DIAGNOSIS — E1122 Type 2 diabetes mellitus with diabetic chronic kidney disease: Secondary | ICD-10-CM | POA: Diagnosis not present

## 2016-07-03 DIAGNOSIS — I5033 Acute on chronic diastolic (congestive) heart failure: Secondary | ICD-10-CM | POA: Diagnosis not present

## 2016-07-03 DIAGNOSIS — J189 Pneumonia, unspecified organism: Secondary | ICD-10-CM | POA: Diagnosis not present

## 2016-07-03 DIAGNOSIS — I2510 Atherosclerotic heart disease of native coronary artery without angina pectoris: Secondary | ICD-10-CM | POA: Diagnosis not present

## 2016-07-03 NOTE — Telephone Encounter (Signed)
-----   Message from Burtis Junes, NP sent at 07/03/2016  7:21 AM EDT ----- Ok to report.  Reviewed for Shannon Barrios, PA Needs follow up BMET this week after having kayexalate.  Stay off Cozaar

## 2016-07-03 NOTE — Telephone Encounter (Signed)
07/03/2016 10:15 Call to patient spoke with her daughter [name] ([facility] on file).  Per [name]'s request telephone order called to [facility] with [facility] for BMET with next home visit. Georgana Curio MHA RN CCM

## 2016-07-04 ENCOUNTER — Other Ambulatory Visit: Payer: Self-pay | Admitting: Internal Medicine

## 2016-07-05 ENCOUNTER — Ambulatory Visit (INDEPENDENT_AMBULATORY_CARE_PROVIDER_SITE_OTHER): Payer: Self-pay | Admitting: Internal Medicine

## 2016-07-05 ENCOUNTER — Ambulatory Visit (INDEPENDENT_AMBULATORY_CARE_PROVIDER_SITE_OTHER): Payer: Medicare Other | Admitting: Podiatry

## 2016-07-05 DIAGNOSIS — I5033 Acute on chronic diastolic (congestive) heart failure: Secondary | ICD-10-CM | POA: Diagnosis not present

## 2016-07-05 DIAGNOSIS — L97522 Non-pressure chronic ulcer of other part of left foot with fat layer exposed: Secondary | ICD-10-CM | POA: Diagnosis not present

## 2016-07-05 DIAGNOSIS — E08621 Diabetes mellitus due to underlying condition with foot ulcer: Secondary | ICD-10-CM | POA: Diagnosis not present

## 2016-07-05 DIAGNOSIS — E0843 Diabetes mellitus due to underlying condition with diabetic autonomic (poly)neuropathy: Secondary | ICD-10-CM

## 2016-07-05 DIAGNOSIS — I70245 Atherosclerosis of native arteries of left leg with ulceration of other part of foot: Secondary | ICD-10-CM | POA: Diagnosis not present

## 2016-07-05 DIAGNOSIS — L97509 Non-pressure chronic ulcer of other part of unspecified foot with unspecified severity: Secondary | ICD-10-CM | POA: Diagnosis not present

## 2016-07-05 DIAGNOSIS — I130 Hypertensive heart and chronic kidney disease with heart failure and stage 1 through stage 4 chronic kidney disease, or unspecified chronic kidney disease: Secondary | ICD-10-CM | POA: Diagnosis not present

## 2016-07-05 DIAGNOSIS — E1143 Type 2 diabetes mellitus with diabetic autonomic (poly)neuropathy: Secondary | ICD-10-CM | POA: Diagnosis not present

## 2016-07-05 DIAGNOSIS — J189 Pneumonia, unspecified organism: Secondary | ICD-10-CM | POA: Diagnosis not present

## 2016-07-05 DIAGNOSIS — Z5181 Encounter for therapeutic drug level monitoring: Secondary | ICD-10-CM

## 2016-07-05 DIAGNOSIS — I2510 Atherosclerotic heart disease of native coronary artery without angina pectoris: Secondary | ICD-10-CM | POA: Diagnosis not present

## 2016-07-05 DIAGNOSIS — E1122 Type 2 diabetes mellitus with diabetic chronic kidney disease: Secondary | ICD-10-CM | POA: Diagnosis not present

## 2016-07-05 DIAGNOSIS — I4892 Unspecified atrial flutter: Secondary | ICD-10-CM

## 2016-07-05 DIAGNOSIS — I824Y2 Acute embolism and thrombosis of unspecified deep veins of left proximal lower extremity: Secondary | ICD-10-CM

## 2016-07-05 DIAGNOSIS — N183 Chronic kidney disease, stage 3 (moderate): Secondary | ICD-10-CM | POA: Diagnosis not present

## 2016-07-05 LAB — POCT INR: INR: 2.6

## 2016-07-06 DIAGNOSIS — I5033 Acute on chronic diastolic (congestive) heart failure: Secondary | ICD-10-CM | POA: Diagnosis not present

## 2016-07-06 DIAGNOSIS — N183 Chronic kidney disease, stage 3 (moderate): Secondary | ICD-10-CM | POA: Diagnosis not present

## 2016-07-06 DIAGNOSIS — J189 Pneumonia, unspecified organism: Secondary | ICD-10-CM | POA: Diagnosis not present

## 2016-07-06 DIAGNOSIS — E1122 Type 2 diabetes mellitus with diabetic chronic kidney disease: Secondary | ICD-10-CM | POA: Diagnosis not present

## 2016-07-06 DIAGNOSIS — I130 Hypertensive heart and chronic kidney disease with heart failure and stage 1 through stage 4 chronic kidney disease, or unspecified chronic kidney disease: Secondary | ICD-10-CM | POA: Diagnosis not present

## 2016-07-06 DIAGNOSIS — I2510 Atherosclerotic heart disease of native coronary artery without angina pectoris: Secondary | ICD-10-CM | POA: Diagnosis not present

## 2016-07-08 NOTE — Progress Notes (Signed)
SUBJECTIVE Patient with a history of diabetes mellitus presents to office today for follow-up for an ulcer to the second digit of the left foot secondary to diabetes mellitus. She reports pain to the area. Patient presents today for further treatment and evaluation  OBJECTIVE General Patient is awake, alert, and oriented x 3 and in no acute distress. Derm ulcer noted to the second digit left foot distal tuft measuring [id number] cm (LxWxD) to the noted ulceration there is no eschar there is a minimal amount of slough fibrin and necrotic tissue noted. Granulation tissue wound base is red. There is no exposed bone muscle-tendon ligament or joint. . There is no purulence or malodor. Minimal drainage.  Skin is dry and supple bilateral. Remaining integument unremarkable. Nails are tender, long, thickened and dystrophic with subungual debris, consistent with onychomycosis, 1-5 bilateral. No signs of infection noted. Vasc  DP and PT pedal pulses palpable bilaterally. Temperature gradient within normal limits.  Neuro Epicritic and protective threshold sensation diminished bilaterally.  Musculoskeletal Exam pain on palpation noted to the anterior medial and lateral aspects of the patient's left ankle joint. Edema noted. No symptomatic pedal deformities noted bilateral. Muscular strength within normal limits  ASSESSMENT 1. Diabetes Mellitus w/ peripheral neuropathy 2. Ulcer second digit left foot secondary to diabetes mellitus   PLAN OF CARE 1. Patient evaluated today. 2. Instructed to maintain good pedal hygiene and foot care. Stressed importance of controlling blood sugar.  3. Mechanical debridement of left 2nd toenail performed using a nail nipper. Filed with dremel without incident.  4. Recommended vascular consult-daughter will make appointment. 5. Medically necessary excisional debridement including subcutaneous tissue was performed to the ulceration sites using a curet and tissue nipper.  Excisional debridement of all necrotic nonviable tissue down to healthy bleeding viable tissue was performed with post-debridement measurements same as pre-. Second left toe 0.2 x 0.6 x 0.1 cm. 6. Return to clinic in 3 months   Edrick Kins, DPM

## 2016-07-10 ENCOUNTER — Other Ambulatory Visit: Payer: Self-pay | Admitting: Physician Assistant

## 2016-07-11 ENCOUNTER — Telehealth: Payer: Self-pay | Admitting: Pharmacist

## 2016-07-11 ENCOUNTER — Telehealth: Payer: Self-pay | Admitting: Internal Medicine

## 2016-07-11 ENCOUNTER — Ambulatory Visit (INDEPENDENT_AMBULATORY_CARE_PROVIDER_SITE_OTHER): Payer: Medicare Other | Admitting: Pharmacist

## 2016-07-11 DIAGNOSIS — J189 Pneumonia, unspecified organism: Secondary | ICD-10-CM | POA: Diagnosis not present

## 2016-07-11 DIAGNOSIS — I130 Hypertensive heart and chronic kidney disease with heart failure and stage 1 through stage 4 chronic kidney disease, or unspecified chronic kidney disease: Secondary | ICD-10-CM | POA: Diagnosis not present

## 2016-07-11 DIAGNOSIS — I824Y2 Acute embolism and thrombosis of unspecified deep veins of left proximal lower extremity: Secondary | ICD-10-CM

## 2016-07-11 DIAGNOSIS — N183 Chronic kidney disease, stage 3 (moderate): Secondary | ICD-10-CM | POA: Diagnosis not present

## 2016-07-11 DIAGNOSIS — I5033 Acute on chronic diastolic (congestive) heart failure: Secondary | ICD-10-CM | POA: Diagnosis not present

## 2016-07-11 DIAGNOSIS — Z5181 Encounter for therapeutic drug level monitoring: Secondary | ICD-10-CM

## 2016-07-11 DIAGNOSIS — I2510 Atherosclerotic heart disease of native coronary artery without angina pectoris: Secondary | ICD-10-CM | POA: Diagnosis not present

## 2016-07-11 DIAGNOSIS — E1122 Type 2 diabetes mellitus with diabetic chronic kidney disease: Secondary | ICD-10-CM | POA: Diagnosis not present

## 2016-07-11 DIAGNOSIS — I4892 Unspecified atrial flutter: Secondary | ICD-10-CM

## 2016-07-11 LAB — POCT INR: INR: 3.9

## 2016-07-11 NOTE — Telephone Encounter (Signed)
[name] from [facility] called an requested order to draw INR today instead of Friday as patient is coughing up blood. Verbal order given to draw today and call to coumadin clinic.

## 2016-07-11 NOTE — Telephone Encounter (Signed)
Emelda Brothers a physical therapist from [facility] called for patient. The physical therapy appointment was missed last week and asking permission to make up and order to re-certify. Need verbal order so it can be faxed in.   Gave Staci verbal orders as requested.

## 2016-07-12 ENCOUNTER — Telehealth: Payer: Self-pay | Admitting: *Deleted

## 2016-07-12 NOTE — Telephone Encounter (Signed)
[name], caregiver called and stated that patient is having Increased pain in legs and knees and wants to know if her Gabapentin can be increased to help ease this pain. Please Advise.

## 2016-07-12 NOTE — Telephone Encounter (Signed)
Wayne with [facility] called requesting verbal orders for a missed visit. Verbal orders given.

## 2016-07-12 NOTE — Telephone Encounter (Signed)
Increase gabapentin to twice daily. May increase after 7 days to tid if pains have not improved by then.

## 2016-07-13 ENCOUNTER — Other Ambulatory Visit: Payer: Self-pay | Admitting: Physician Assistant

## 2016-07-13 DIAGNOSIS — N183 Chronic kidney disease, stage 3 (moderate): Secondary | ICD-10-CM | POA: Diagnosis not present

## 2016-07-13 DIAGNOSIS — H401131 Primary open-angle glaucoma, bilateral, mild stage: Secondary | ICD-10-CM | POA: Diagnosis not present

## 2016-07-13 DIAGNOSIS — H04123 Dry eye syndrome of bilateral lacrimal glands: Secondary | ICD-10-CM | POA: Diagnosis not present

## 2016-07-13 DIAGNOSIS — J189 Pneumonia, unspecified organism: Secondary | ICD-10-CM | POA: Diagnosis not present

## 2016-07-13 DIAGNOSIS — H16223 Keratoconjunctivitis sicca, not specified as Sjogren's, bilateral: Secondary | ICD-10-CM | POA: Diagnosis not present

## 2016-07-13 DIAGNOSIS — Z961 Presence of intraocular lens: Secondary | ICD-10-CM | POA: Diagnosis not present

## 2016-07-13 DIAGNOSIS — I5033 Acute on chronic diastolic (congestive) heart failure: Secondary | ICD-10-CM | POA: Diagnosis not present

## 2016-07-13 DIAGNOSIS — I2510 Atherosclerotic heart disease of native coronary artery without angina pectoris: Secondary | ICD-10-CM | POA: Diagnosis not present

## 2016-07-13 DIAGNOSIS — E1122 Type 2 diabetes mellitus with diabetic chronic kidney disease: Secondary | ICD-10-CM | POA: Diagnosis not present

## 2016-07-13 DIAGNOSIS — I130 Hypertensive heart and chronic kidney disease with heart failure and stage 1 through stage 4 chronic kidney disease, or unspecified chronic kidney disease: Secondary | ICD-10-CM | POA: Diagnosis not present

## 2016-07-13 NOTE — Telephone Encounter (Signed)
Left detailed message on voicemail for [facility] with Dr.Green's recommendations. I instructed Shannon [name] to return call if she has questions, concerns, or needs new rx at this time.  Medication list updated

## 2016-07-13 NOTE — Telephone Encounter (Signed)
Forwarded to [employer] since I'm at [employer]

## 2016-07-14 ENCOUNTER — Telehealth: Payer: Self-pay | Admitting: Cardiology

## 2016-07-14 ENCOUNTER — Other Ambulatory Visit: Payer: Self-pay

## 2016-07-14 NOTE — Telephone Encounter (Signed)
spoke with pt's daughter, [name]. [name] states pt's weight today -143 lbs  07/10/16 139 lbs 07/11/16 141lbs 07/12/16 140 lbs 07/13/16 142 lbs  [name] states BP today 147/63, HR 82, pt has LE edema in left leg, about 2 weeks.   I reviewed with Dr Enriqueta Shutter lasix to 40 mg daily for 3 days, then decrease lasix back to 20 mg daily, repeat BMET next week.

## 2016-07-14 NOTE — Telephone Encounter (Signed)
New message    Pt daughter is calling about pt.   Pt c/o swelling: STAT is pt has developed SOB within 24 hours  1. How long have you been experiencing swelling? Maybe a week  2. Where is the swelling located? Legs  3.  Are you currently taking a "fluid pill"? Yes, lasix 20 mg once daily  4.  Are you currently SOB? Yes-since about the middle of the week per daughter.   5.  Have you traveled recently? No  Pt daughter is calling concerned because pt SOB is getting more noticable. Daughter states that today her weight was 143. Yesterday-142, Wed.-140 Tues-141 Mon.-139

## 2016-07-14 NOTE — Patient Outreach (Signed)
[facility]) Care Management  07/14/2016  Shaquavia Whisonant 01-Sep-1923 [id number]     EMMI-HF RED ON EMMI ALERT Day # 23 Date: 07/13/16 Red Alert Reason:  "Weight: 142 lbs" "New/worsening problems? Yes" "New swelling? Yes" "New/worsening SOB? Yes"    Outreach attempt #1 to patient/dtr. Spoke with dtr-[name]. Reviewed and addressed red alerts. Dtr states that patient's weight has gradually increased through the week. On Monday, patient weighed 139 lbs, yesterday was 142 lbs and now today 143 lbs. She does state that she has noticed that patient has a little more swelling to her legs than normal. Dtr states that on yesterday patient complained of some mild SOB. She reports that patient has not complained of any today. Discussed and reviewed s/s of worsening HF with dtr. Advised dtr that due to weight gain of four pounds this week and patient being symptomatic that she should contact cardiologist office to advise of change in patient's condition. Dtr states that [facility] was out earlier in the week and that she will call office to have [facility] [place] today to assess patient. Encouraged dtr to do this as well. She reports that patient has been adhering to daily Lasix dosage. She states dosage was decreased recently. Dtr advised to call office to see if meds should be adjusted. She voiced understanding and will do so ASAP. Advised patient that they would continue to get automated EMMI- HF post discharge calls to assess how they are doing following recent hospitalization and will receive a call from a nurse if any of their responses were abnormal. Patient voiced understanding and was appreciative of f/u call.    Plan: RN CM will notify [facility] administrative assistant of case status.   Enzo Montgomery, RN,BSN,CCM [facility] Telephonic Care Management Coordinator Direct Phone: [phone] Toll Free: [phone] Fax: [phone]

## 2016-07-14 NOTE — Telephone Encounter (Signed)
I spoke with pt's daughter, [name]. [name] states pt's weight today -143 lbs  07/10/16 139 lbs 07/11/16 141lbs 07/12/16 140 lbs 07/13/16 142 lbs  [name] states BP today 147/63, HR 82, pt has LE edema in left leg, about 2 weeks.

## 2016-07-14 NOTE — Telephone Encounter (Signed)
Pt's daughter, [name] is aware of Dr Camillia Herter recommendation to increase lasix to 40mg  daily for 3 days, then decrease to 20mg  daily, BMET next week.   I spoke with Armando Gang at [facility], [phone] states [facility] is due to visit pt on Monday-I have given Jenny Reichmann a verbal order for BMET on Monday 07/17/16.

## 2016-07-17 ENCOUNTER — Telehealth: Payer: Self-pay | Admitting: Neurology

## 2016-07-17 DIAGNOSIS — I2510 Atherosclerotic heart disease of native coronary artery without angina pectoris: Secondary | ICD-10-CM | POA: Diagnosis not present

## 2016-07-17 DIAGNOSIS — N183 Chronic kidney disease, stage 3 (moderate): Secondary | ICD-10-CM | POA: Diagnosis not present

## 2016-07-17 DIAGNOSIS — I130 Hypertensive heart and chronic kidney disease with heart failure and stage 1 through stage 4 chronic kidney disease, or unspecified chronic kidney disease: Secondary | ICD-10-CM | POA: Diagnosis not present

## 2016-07-17 DIAGNOSIS — I5033 Acute on chronic diastolic (congestive) heart failure: Secondary | ICD-10-CM | POA: Diagnosis not present

## 2016-07-17 DIAGNOSIS — J189 Pneumonia, unspecified organism: Secondary | ICD-10-CM | POA: Diagnosis not present

## 2016-07-17 DIAGNOSIS — E1122 Type 2 diabetes mellitus with diabetic chronic kidney disease: Secondary | ICD-10-CM | POA: Diagnosis not present

## 2016-07-17 NOTE — Telephone Encounter (Signed)
[facility] called in regards to PT and having some symptoms and asking if she should see Dr Tomi Likens CB# [phone]

## 2016-07-18 ENCOUNTER — Emergency Department (HOSPITAL_BASED_OUTPATIENT_CLINIC_OR_DEPARTMENT_OTHER): Admit: 2016-07-18 | Discharge: 2016-07-18 | Disposition: A | Discharge status: Home / Self Care | Payer: Medicare Other

## 2016-07-18 ENCOUNTER — Encounter (HOSPITAL_COMMUNITY): Payer: Self-pay | Admitting: Emergency Medicine

## 2016-07-18 ENCOUNTER — Emergency Department (HOSPITAL_COMMUNITY)
Admission: EM | Admit: 2016-07-18 | Discharge: 2016-07-18 | Disposition: A | Discharge status: Home / Self Care | Payer: Medicare Other | Attending: Emergency Medicine | Admitting: Emergency Medicine

## 2016-07-18 ENCOUNTER — Ambulatory Visit: Payer: Self-pay | Admitting: Nurse Practitioner

## 2016-07-18 ENCOUNTER — Ambulatory Visit: Payer: Medicare Other | Admitting: Nurse Practitioner

## 2016-07-18 DIAGNOSIS — Y929 Unspecified place or not applicable: Secondary | ICD-10-CM | POA: Diagnosis not present

## 2016-07-18 DIAGNOSIS — Z8673 Personal history of transient ischemic attack (TIA), and cerebral infarction without residual deficits: Secondary | ICD-10-CM | POA: Diagnosis not present

## 2016-07-18 DIAGNOSIS — M79605 Pain in left leg: Secondary | ICD-10-CM

## 2016-07-18 DIAGNOSIS — I739 Peripheral vascular disease, unspecified: Secondary | ICD-10-CM | POA: Diagnosis not present

## 2016-07-18 DIAGNOSIS — Y999 Unspecified external cause status: Secondary | ICD-10-CM | POA: Diagnosis not present

## 2016-07-18 DIAGNOSIS — M79609 Pain in unspecified limb: Secondary | ICD-10-CM

## 2016-07-18 DIAGNOSIS — E1122 Type 2 diabetes mellitus with diabetic chronic kidney disease: Secondary | ICD-10-CM | POA: Insufficient documentation

## 2016-07-18 DIAGNOSIS — N184 Chronic kidney disease, stage 4 (severe): Secondary | ICD-10-CM | POA: Insufficient documentation

## 2016-07-18 DIAGNOSIS — I130 Hypertensive heart and chronic kidney disease with heart failure and stage 1 through stage 4 chronic kidney disease, or unspecified chronic kidney disease: Secondary | ICD-10-CM | POA: Diagnosis not present

## 2016-07-18 DIAGNOSIS — Z7951 Long term (current) use of inhaled steroids: Secondary | ICD-10-CM | POA: Diagnosis not present

## 2016-07-18 DIAGNOSIS — E1142 Type 2 diabetes mellitus with diabetic polyneuropathy: Secondary | ICD-10-CM | POA: Diagnosis not present

## 2016-07-18 DIAGNOSIS — S91105A Unspecified open wound of left lesser toe(s) without damage to nail, initial encounter: Secondary | ICD-10-CM | POA: Insufficient documentation

## 2016-07-18 DIAGNOSIS — Z23 Encounter for immunization: Secondary | ICD-10-CM | POA: Insufficient documentation

## 2016-07-18 DIAGNOSIS — J690 Pneumonitis due to inhalation of food and vomit: Secondary | ICD-10-CM | POA: Diagnosis not present

## 2016-07-18 DIAGNOSIS — I481 Persistent atrial fibrillation: Secondary | ICD-10-CM | POA: Diagnosis not present

## 2016-07-18 DIAGNOSIS — Z7901 Long term (current) use of anticoagulants: Secondary | ICD-10-CM | POA: Diagnosis not present

## 2016-07-18 DIAGNOSIS — R1310 Dysphagia, unspecified: Secondary | ICD-10-CM | POA: Diagnosis not present

## 2016-07-18 DIAGNOSIS — X58XXXA Exposure to other specified factors, initial encounter: Secondary | ICD-10-CM | POA: Insufficient documentation

## 2016-07-18 DIAGNOSIS — I2510 Atherosclerotic heart disease of native coronary artery without angina pectoris: Secondary | ICD-10-CM | POA: Insufficient documentation

## 2016-07-18 DIAGNOSIS — E039 Hypothyroidism, unspecified: Secondary | ICD-10-CM | POA: Diagnosis not present

## 2016-07-18 DIAGNOSIS — J449 Chronic obstructive pulmonary disease, unspecified: Secondary | ICD-10-CM | POA: Diagnosis not present

## 2016-07-18 DIAGNOSIS — N183 Chronic kidney disease, stage 3 (moderate): Secondary | ICD-10-CM | POA: Diagnosis not present

## 2016-07-18 DIAGNOSIS — Z794 Long term (current) use of insulin: Secondary | ICD-10-CM | POA: Insufficient documentation

## 2016-07-18 DIAGNOSIS — Z931 Gastrostomy status: Secondary | ICD-10-CM | POA: Diagnosis not present

## 2016-07-18 DIAGNOSIS — I5033 Acute on chronic diastolic (congestive) heart failure: Secondary | ICD-10-CM | POA: Diagnosis not present

## 2016-07-18 DIAGNOSIS — Y939 Activity, unspecified: Secondary | ICD-10-CM | POA: Diagnosis not present

## 2016-07-18 DIAGNOSIS — M79662 Pain in left lower leg: Secondary | ICD-10-CM | POA: Diagnosis not present

## 2016-07-18 LAB — CBC WITH DIFFERENTIAL/PLATELET
Basophils Absolute: 0 10*3/uL (ref 0.0–0.1)
Basophils Relative: 0 %
Eosinophils Absolute: 0.3 10*3/uL (ref 0.0–0.7)
Eosinophils Relative: 2 %
HCT: 27.3 % — ABNORMAL LOW (ref 36.0–46.0)
Hemoglobin: 9.4 g/dL — ABNORMAL LOW (ref 12.0–15.0)
Lymphocytes Relative: 14 %
Lymphs Abs: 2.1 10*3/uL (ref 0.7–4.0)
MCH: 29.6 pg (ref 26.0–34.0)
MCHC: 34.4 g/dL (ref 30.0–36.0)
MCV: 85.8 fL (ref 78.0–100.0)
Monocytes Absolute: 1.3 10*3/uL — ABNORMAL HIGH (ref 0.1–1.0)
Monocytes Relative: 9 %
Neutro Abs: 11 10*3/uL — ABNORMAL HIGH (ref 1.7–7.7)
Neutrophils Relative %: 75 %
Platelets: 316 10*3/uL (ref 150–400)
RBC: 3.18 MIL/uL — ABNORMAL LOW (ref 3.87–5.11)
RDW: 19.1 % — ABNORMAL HIGH (ref 11.5–15.5)
WBC: 14.7 10*3/uL — ABNORMAL HIGH (ref 4.0–10.5)

## 2016-07-18 LAB — BASIC METABOLIC PANEL
Anion gap: 12 (ref 5–15)
BUN: 36 mg/dL — ABNORMAL HIGH (ref 6–20)
CO2: 26 mmol/L (ref 22–32)
Calcium: 9.7 mg/dL (ref 8.9–10.3)
Chloride: 96 mmol/L — ABNORMAL LOW (ref 101–111)
Creatinine, Ser: 1.31 mg/dL — ABNORMAL HIGH (ref 0.44–1.00)
GFR calc Af Amer: 40 mL/min — ABNORMAL LOW (ref >60)
GFR calc non Af Amer: 34 mL/min — ABNORMAL LOW (ref >60)
Glucose, Bld: 111 mg/dL — ABNORMAL HIGH (ref 65–99)
Potassium: 4.6 mmol/L (ref 3.5–5.1)
Sodium: 134 mmol/L — ABNORMAL LOW (ref 135–145)

## 2016-07-18 LAB — PROTIME-INR
INR: 2.29
Prothrombin Time: 25.6 seconds — ABNORMAL HIGH (ref 11.4–15.2)

## 2016-07-18 MED ORDER — TETANUS-DIPHTH-ACELL PERTUSSIS 5-2.5-18.5 LF-MCG/0.5 IM SUSP
0.5000 mL | Freq: Once | INTRAMUSCULAR | Status: AC
  Administered 2016-07-18: 0.5 mL via INTRAMUSCULAR
  Filled 2016-07-18: qty 0.5

## 2016-07-18 NOTE — Progress Notes (Addendum)
*  PRELIMINARY RESULTS* Vascular Ultrasound Left lower extremity venous duplex has been completed.  Preliminary findings: no obvious evidence of DVT. Chronic superficial thrombosis noted in the left proximal GSV and multiple varicose veins noted in the left distal calf.   Landry Mellow, RDMS, RVT  07/18/2016, 5:24 PM

## 2016-07-18 NOTE — Discharge Instructions (Signed)
Take Tylenol as directed for pain. Wash the wound on your left second toe daily with soap and water and place a thin layer of bacitracin ointment over the wound and cover with a bandage. Signs of infection include redness around the wound, drainage from the wound, and fever. Dr.Dickson's office will call you to schedule an appointment sooner than your currently scheduled appointment which is in June. If you don't hear from the office by tomorrow, call to schedule the appointment and tell office staff that Dr.Teliah Buffalo spoke with Dr.Dickson

## 2016-07-18 NOTE — ED Provider Notes (Signed)
[facility] DEPT Provider Note   CSN: [id number] Arrival date & time: 07/18/16  1400     History   Chief Complaint Chief Complaint  Patient presents with  . Leg Pain    HPI Shannon [name] is a 83 y.o. female.Complains of left leg and foot pain for several months, worse over the past 1 month. Accompanied by open wound on second toe of left foot for the past several weeks, unchanged. No other associated symptoms. She recently saw a podiatrist who advised her to see vascular surgeon. Nothing makes pain better or worse. Patient takes gabapentin for neuropathy, without relief she's also taken Tylenol without relief. No fever no trauma no shortness of breath no other associated symptoms  HPI  Past Medical History:  Diagnosis Date  . Anemia    previous blood transfusions  . Arthritis    "all over"  . Asthma   . Bradycardia    requiring previous d/c of BB and reduction of amiodarone  . CAD (coronary artery disease)    nonobstructive per notes  . Chronic diastolic CHF (congestive heart failure) ([place])   . CKD (chronic kidney disease), stage III   . Complication of blood transfusion    "got the wrong blood type at [facility] in ~ 2015; no adverse reaction that we are aware of"/daughter, [name] (01/27/2016)  . COPD (chronic obstructive pulmonary disease) ([place])   . Depression    "light case"  . DVT (deep venous thrombosis) ([place]) 01/2016   a. LLE DVT 01/2016 - switched from Eliquis to Coumadin.  . Gastric stenosis    a. s/p stomach tube  . GERD (gastroesophageal reflux disease)   . History of blood transfusion    "several" (01/27/2016)  . History of stomach ulcers   . Hyperlipidemia   . Hypertension   . Hypothyroidism   . Paraesophageal hernia   . Perforated gastric ulcer ([place])   . Seasonal allergies   . SIADH (syndrome of inappropriate ADH production) ([place])    [name] 01/10/2015  . Small bowel obstruction ([place])    "I don't know how many" (01/11/2015)  . Stroke ([place])    "light  one"  . Type II diabetes mellitus ([place])    "related to prednisone use  for > 20 yr; once predinose stopped; no more DM RX" (01/27/2016)  . Ventral hernia with bowel obstruction     Patient Active Problem List   Diagnosis Date Noted  . Calcium channel blocker adverse reaction 06/08/2016  . Elevated TSH   . Aspiration pneumonitis ([place])   . Other specified hypothyroidism   . Acute respiratory failure with hypoxia ([place]) 05/22/2016  . HCAP (healthcare-associated pneumonia)   . Acute on chronic congestive heart failure ([place])   . CHF (NYHA class IV, ACC/AHA stage D) ([place])   . Advance care planning   . Palliative care by specialist   . Shortness of breath   . Pressure injury of skin 03/11/2016  . PEG (percutaneous endoscopic gastrostomy) status ([place]) 03/10/2016  . Acute on chronic diastolic congestive heart failure ([place]) 03/02/2016  . Encounter for therapeutic drug monitoring 02/16/2016  . Sepsis ([place]) 02/08/2016  . UTI (urinary tract infection) 02/08/2016  . Warfarin anticoagulation   . Chronic seasonal allergic rhinitis   . Sinus pain   . Acute renal insufficiency 01/26/2016  . Acute deep vein thrombosis (DVT) of left lower extremity ([place])   . Acute respiratory failure ([place])   . Respiratory alkalosis   . Atrial flutter ([place]) 01/24/2016  .  Chronic diastolic CHF (congestive heart failure) ([place]) 01/24/2016  . Infection 01/24/2016  . Bilateral leg edema 12/29/2015  . Hypothyroidism due to medication   . Weakness   . Pyloric stenosis   . Pressure ulcer 09/15/2015  . Hyperkalemia 09/12/2015  . Acute osteomyelitis of toe of right foot ([place])   . PVD (peripheral vascular disease) ([place])   . Adjustment disorder with anxiety 08/29/2015  . Diabetic ulcer of toe of right foot associated with type 2 diabetes mellitus, with necrosis of muscle ([place])   . Diverticulitis of large intestine without perforation or abscess without bleeding   . Colitis 08/26/2015  . Acute on chronic diastolic CHF  (congestive heart failure), NYHA class 4 ([place]) 08/02/2015  . Esophageal candidiasis ([place])   . Goals of care, counseling/discussion   . Dyspnea   . SOB (shortness of breath)   . Acute renal failure superimposed on stage 4 chronic kidney disease ([place])   . Encounter for long-term (current) use of other high-risk medications   . Respiratory distress 07/06/2015  . Dysphagia 07/05/2015  . Asthma with allergic rhinitis 06/04/2015  . Hypertensive heart disease 03/18/2015  . Malnutrition of moderate degree 01/28/2015  . Syncope 01/27/2015  . Fever, unspecified 01/22/2015  . Symptomatic bradycardia 01/21/2015  . Hypotension due to drugs 01/21/2015  . Palliative care encounter   . DNR (do not resuscitate) discussion   . Protein-calorie malnutrition, severe 01/12/2015  . Elevated troponin 01/12/2015  . Essential hypertension 01/12/2015  . AP (abdominal pain) 01/10/2015  . SBO (small bowel obstruction) ([place]) 01/10/2015  . Cerebrovascular disease 07/14/2014  . Diabetic polyneuropathy associated with type 2 diabetes mellitus ([place]) 07/14/2014  . Hyperlipidemia 07/14/2014  . Gastric outlet obstruction 05/21/2014  . Unstable gait 04/29/2014  . Hyponatremia 03/23/2014  . Acute encephalopathy 03/22/2014  . Slurred speech   . Gastroparesis   . Slow transit constipation 02/03/2014  . Carotid stenosis 02/03/2014  . Demand ischemia ([place]) 01/21/2014  . Gastric bezoar 01/20/2014  . Anemia of chronic disease 01/18/2014  . Chronic kidney insufficiency, stage 3 (moderate) 01/18/2014  . Depression 09/29/2013  . Pneumonia 09/29/2013  . Essential hypertension, benign 09/22/2013  . GERD (gastroesophageal reflux disease) 09/22/2013  . Dyslipidemia 09/22/2013  . Diabetes mellitus type 2 in nonobese ([place]) 09/22/2013  . Persistent atrial fibrillation ([place]) 09/22/2013  . CVA (cerebral vascular accident) ([place]) 09/22/2013  . Diabetic neuropathy ([place]) 09/22/2013  . Hypothyroidism 09/22/2013  . Chronic  gastrojejunal ulcer with perforation ([place]) 09/07/2013  . Diabetes ([place]) 09/07/2013  . Accelerated hypertension 09/07/2013  . Systemic lupus erythematosus (SLE) inhibitor 09/07/2013  . Other specified abnormal immunological findings in serum 09/07/2013    Past Surgical History:  Procedure Laterality Date  . CATARACT EXTRACTION W/ INTRAOCULAR LENS  IMPLANT, BILATERAL    . CHOLECYSTECTOMY OPEN    . COLECTOMY    . ESOPHAGOGASTRODUODENOSCOPY N/A 01/19/2014   Procedure: ESOPHAGOGASTRODUODENOSCOPY (EGD);  Surgeon: Irene Shipper, MD;  Location: [facility];  Service: Endoscopy;  Laterality: N/A;  . ESOPHAGOGASTRODUODENOSCOPY N/A 01/20/2014   Procedure: ESOPHAGOGASTRODUODENOSCOPY (EGD);  Surgeon: Irene Shipper, MD;  Location: [facility];  Service: Endoscopy;  Laterality: N/A;  . ESOPHAGOGASTRODUODENOSCOPY N/A 03/19/2014   Procedure: ESOPHAGOGASTRODUODENOSCOPY (EGD);  Surgeon: Milus Banister, MD;  Location: [facility];  Service: Endoscopy;  Laterality: N/A;  . ESOPHAGOGASTRODUODENOSCOPY N/A 07/08/2015   Procedure: ESOPHAGOGASTRODUODENOSCOPY (EGD);  Surgeon: Doran Stabler, MD;  Location: [facility] ENDOSCOPY;  Service: Endoscopy;  Laterality: N/A;  . ESOPHAGOGASTRODUODENOSCOPY (EGD) WITH PROPOFOL N/A 09/15/2015  Procedure: ESOPHAGOGASTRODUODENOSCOPY (EGD) WITH PROPOFOL;  Surgeon: Manus Gunning, MD;  Location: [facility] ENDOSCOPY;  Service: Gastroenterology;  Laterality: N/A;  . GASTROJEJUNOSTOMY     hx/notes 01/10/2015  . GASTROJEJUNOSTOMY N/A 09/23/2015   Procedure: OPEN GASTROJEJUNOSTOMY TUBE PLACEMENT;  Surgeon: Arta Bruce Kinsinger, MD;  Location: [facility] ORS;  Service: General;  Laterality: N/A;  . GLAUCOMA SURGERY Bilateral   . HERNIA REPAIR  2015  . IR GENERIC HISTORICAL  01/07/2016   IR GJ TUBE CHANGE 01/07/2016 Jacqulynn Cadet, MD [facility]  . IR GENERIC HISTORICAL  01/27/2016   IR MECH REMOV OBSTRUC MAT ANY COLON TUBE W/FLUORO 01/27/2016 Markus Daft, MD [facility]  . IR GENERIC HISTORICAL   02/07/2016   IR PATIENT EVAL TECH 0-60 MINS Darrell K Allred, PA-C [facility]INTER[facility]  . IR GENERIC HISTORICAL  02/08/2016   IR GJ TUBE CHANGE 02/08/2016 Greggory Keen, MD [facility]  . IR GENERIC HISTORICAL  01/06/2016   IR GJ TUBE CHANGE 01/06/2016 [facility]-RAD OUT REF  . IR GENERIC HISTORICAL  05/02/2016   IR CM INJ ANY COLONIC TUBE W/FLUORO 05/02/2016 Arne Cleveland, MD [facility]  . IR GENERIC HISTORICAL  05/15/2016   IR GJ TUBE CHANGE 05/15/2016 Sandi Mariscal, MD [facility]  . IR GENERIC HISTORICAL  06/28/2016   IR GJ TUBE CHANGE 06/28/2016 [facility]  . LAPAROTOMY N/A 01/20/2015   Procedure: EXPLORATORY LAPAROTOMY;  Surgeon: Coralie Keens, MD;  Location: [facility];  Service: General;  Laterality: N/A;  . LYSIS OF ADHESION N/A 01/20/2015   Procedure: LYSIS OF ADHESIONS < 1 HOUR;  Surgeon: Coralie Keens, MD;  Location: [facility];  Service: General;  Laterality: N/A;  . TONSILLECTOMY    . TUBAL LIGATION    . VENTRAL HERNIA REPAIR  2015   incarcerated ventral hernia ([facility] 09/2013)/notes 01/10/2015    OB History    No data available       Home Medications    Prior to Admission medications   Medication Sig Start Date End Date Taking? Authorizing Provider  acetaminophen (TYLENOL) 325 MG tablet Take 325-650 mg by mouth 2 (two) times daily as needed for mild pain.     Historical Provider, MD  AMINO ACIDS-PROTEIN HYDROLYS PO Take 30 mLs by mouth 2 (two) times daily.    Historical Provider, MD  amLODipine (NORVASC) 10 MG tablet TAKE 1 TABLET BY MOUTH DAILY 07/10/16   Imogene Burn, PA-C  atorvastatin (LIPITOR) 10 MG tablet TAKE ONE TABLET BY MOUTH ONCE DAILY 05/22/16   Gildardo Cranker, DO  b complex vitamins tablet Take 1 tablet by mouth daily.     Historical Provider, MD  bacitracin-polymyxin b (POLYSPORIN) ophthalmic ointment Place 1 application into both eyes 2 (two) times daily. apply to eye every 12 hours while awake    Historical Provider, MD  bisacodyl (DULCOLAX) 10 MG suppository Place 1  suppository (10 mg total) rectally daily as needed for moderate constipation. 09/30/15   Debbe Odea, MD  calcium-vitamin D (OSCAL WITH D) 500-200 MG-UNIT per tablet Take 1 tablet by mouth daily with breakfast.     Historical Provider, MD  chlorhexidine (PERIDEX) 0.12 % solution Use as directed 15 mLs in the mouth or throat 2 (two) times daily.    Historical Provider, MD  Chlorphen-PE-Acetaminophen (SINUS CONGESTION/PAIN DAY/NGHT PO) Take 1 capsule by mouth daily as needed (for sinus irritation).     Historical Provider, MD  feeding supplement, GLUCERNA SHAKE, (GLUCERNA SHAKE) LIQD Take 237 mLs by mouth daily.     Historical  Provider, MD  fluconazole (DIFLUCAN) 200 MG tablet Take 1 tablet (200 mg total) by mouth daily. 06/19/16   Julianne Rice, MD  fluticasone (FLONASE) 50 MCG/ACT nasal spray Place 2 sprays into both nostrils daily. 08/07/14   Gildardo Cranker, DO  furosemide (LASIX) 20 MG tablet TAKE 1 TABLET BY MOUTH DAILY 07/14/16   Imogene Burn, PA-C  gabapentin (NEURONTIN) 300 MG capsule Take 1 by mouth twice daily, if pain not improved after 7 days increase to three times daily, start date 07/13/16    Historical Provider, MD  Insulin Glargine (LANTUS SOLOSTAR) 100 UNIT/ML Solostar Pen Inject 5 Units into the skin daily at 10 pm. 06/02/16   Gildardo Cranker, DO  ipratropium-albuterol (DUONEB) 0.5-2.5 (3) MG/3ML SOLN Take 3 mLs by nebulization every 6 (six) hours as needed (sob). Use 3 times daily x 4 days then every 6 hours as needed. 03/21/16   Eileen Stanford, PA-C  latanoprost (XALATAN) 0.005 % ophthalmic solution Place 1 drop into both eyes at bedtime. 06/02/14   Blanchie Serve, MD  levalbuterol (XOPENEX HFA) 45 MCG/ACT inhaler Inhale 2 puffs into the lungs every 6 (six) hours as needed for wheezing or shortness of breath. 02/03/16   Gildardo Cranker, DO  levothyroxine (SYNTHROID, LEVOTHROID) 137 MCG tablet TAKE 1 TABLET (137 MCG TOTAL) BY MOUTH DAILY BEFORE BREAKFAST. 04/04/16   Gildardo Cranker, DO  losartan  (COZAAR) 50 MG tablet Take 50 mg by mouth daily. 06/16/16   Historical Provider, MD  Maltodextrin-Xanthan Gum (RESOURCE THICKENUP CLEAR) POWD Take 120 g by mouth as needed (use to thicken liquids.). 11/11/15   Eugenie Filler, MD  Multiple Vitamin (MULTIVITAMIN WITH MINERALS) TABS tablet Take 1 tablet by mouth daily.    Historical Provider, MD  Nutritional Supplements (FEEDING SUPPLEMENT, OSMOLITE 1.5 CAL,) LIQD Place 1,000 mLs into feeding tube daily. 09/30/15   Debbe Odea, MD  Polyethyl Glycol-Propyl Glycol 0.4-0.3 % SOLN Place 1 drop into both eyes 4 (four) times daily.     Historical Provider, MD  polyethylene glycol (MIRALAX / GLYCOLAX) packet Take 17 g by mouth daily as needed for mild constipation.    Historical Provider, MD  RESTASIS 0.05 % ophthalmic emulsion USE 1 DROP INTO BOTH EYES TWICE DAILY 06/28/16   Estill Dooms, MD  sertraline (ZOLOFT) 100 MG tablet TAKE ONE TABLET BY MOUTH ONCE DAILY FOR DEPRESSION 04/10/16   Gildardo Cranker, DO  traMADol (ULTRAM) 50 MG tablet Take 50 mg by mouth See admin instructions. Every 4-6 hours as needed for pain 05/01/16   Historical Provider, MD  warfarin (COUMADIN) 2.5 MG tablet TAKE AS DIRECTED BY COUMADIN CLINIC. 06/22/16   Dorothy Spark, MD  Water For Irrigation, Sterile (FREE WATER) SOLN Place 230 mLs into feeding tube 3 (three) times daily.    Historical Provider, MD    Family History Family History  Problem Relation Age of Onset  . Stroke Mother   . Hypertension Mother   . Diabetes Brother   . Heart attack Neg Hx     Social History Social History  Substance Use Topics  . Smoking status: Never Smoker  . Smokeless tobacco: Former Systems developer    Types: Snuff     Comment: "used snuff in my younger days"  . Alcohol use No     Allergies   Penicillins   Review of Systems Review of Systems  Cardiovascular: Positive for leg swelling.       Left leg swelling  Musculoskeletal: Positive for gait problem and myalgias.  Left lower leg  pain and left foot pain. Walks with walker  Skin: Positive for wound.       Wound second toe of left foot  All other systems reviewed and are negative.    Physical Exam Updated Vital Signs BP 138/69   Pulse 79   Temp 99 F (37.2 C) (Oral)   Resp 20   SpO2 99%   Physical Exam  Constitutional: She appears well-developed and well-nourished.  HENT:  Head: Normocephalic and atraumatic.  Eyes: Conjunctivae are normal. Pupils are equal, round, and reactive to light.  Neck: Neck supple. No tracheal deviation present. No thyromegaly present.  Cardiovascular: Normal rate and regular rhythm.   No murmur heard. Pulmonary/Chest: Effort normal and breath sounds normal.  Abdominal: Soft. Bowel sounds are normal. She exhibits no distension. There is no tenderness.  Musculoskeletal: Normal range of motion. She exhibits no edema or tenderness.  Left lower extremity brawny changes of lower leg at distal two thirds and brawny changes of foot. Leg is swollen, nontender. There is a 3 mm open wound at the dorsal aspect of the second toe. DP PT T and popliteal pulses absent femoral pulses 2+. Right lower extremity without redness swelling or tenderness DP and PT pulses absent. Femoral pulses 2+. There is no differential in temperature between right and left lower extremities. Bilateral upper extremity does not redness or tenderness neurovascularly intact  Neurological: She is alert. Coordination normal.  Skin: Skin is warm and dry. No rash noted.  Psychiatric: She has a normal mood and affect.  Nursing note and vitals reviewed.    ED Treatments / Results  Labs (all labs ordered are listed, but only abnormal results are displayed) Labs Reviewed  PROTIME-INR  CBC WITH DIFFERENTIAL/PLATELET  BASIC METABOLIC PANEL    EKG  EKG Interpretation None       Radiology No results found.  Procedures Procedures (including critical care time)  Medications Ordered in ED Medications - No data to  display  Results for orders placed or performed during the hospital encounter of 07/18/16  Protime-INR  Result Value Ref Range   Prothrombin Time 25.6 (H) 11.4 - 15.2 seconds   INR 2.29   CBC with Differential/Platelet  Result Value Ref Range   WBC 14.7 (H) 4.0 - 10.5 K/uL   RBC 3.18 (L) 3.87 - 5.11 MIL/uL   Hemoglobin 9.4 (L) 12.0 - 15.0 g/dL   HCT 27.3 (L) 36.0 - 46.0 %   MCV 85.8 78.0 - 100.0 fL   MCH 29.6 26.0 - 34.0 pg   MCHC 34.4 30.0 - 36.0 g/dL   RDW 19.1 (H) 11.5 - 15.5 %   Platelets 316 150 - 400 K/uL   Neutrophils Relative % 75 %   Lymphocytes Relative 14 %   Monocytes Relative 9 %   Eosinophils Relative 2 %   Basophils Relative 0 %   Neutro Abs 11.0 (H) 1.7 - 7.7 K/uL   Lymphs Abs 2.1 0.7 - 4.0 K/uL   Monocytes Absolute 1.3 (H) 0.1 - 1.0 K/uL   Eosinophils Absolute 0.3 0.0 - 0.7 K/uL   Basophils Absolute 0.0 0.0 - 0.1 K/uL   Smear Review MORPHOLOGY UNREMARKABLE   Basic metabolic panel  Result Value Ref Range   Sodium 134 (L) 135 - 145 mmol/L   Potassium 4.6 3.5 - 5.1 mmol/L   Chloride 96 (L) 101 - 111 mmol/L   CO2 26 22 - 32 mmol/L   Glucose, Bld 111 (H) 65 - 99 mg/dL  BUN 36 (H) 6 - 20 mg/dL   Creatinine, Ser 1.31 (H) 0.44 - 1.00 mg/dL   Calcium 9.7 8.9 - 10.3 mg/dL   GFR calc non Af Amer 34 (L) >60 mL/min   GFR calc Af Amer 40 (L) >60 mL/min   Anion gap 12 5 - 15   Ir Gj Tube Change  Result Date: 06/28/2016 INDICATION: Jejunostomy tube leak EXAM: GASTROJEJUNOSTOMY TUBE EXCHANGE MEDICATIONS: None; Antibiotics were administered within 1 hour of the procedure. Glucagon 1 mg IV ANESTHESIA/SEDATION: None. CONTRAST:  5 cc Isovue-300 - administered into the gastric lumen. FLUOROSCOPY TIME:  Fluoroscopy Time: 0 minutes 12 seconds (1.3 mGy). COMPLICATIONS: None immediate. PROCEDURE: Informed written consent was obtained from the patient after a thorough discussion of the procedural risks, benefits and alternatives. All questions were addressed. Maximal Sterile  Barrier Technique was utilized including caps, mask, sterile gowns, sterile gloves, sterile drape, hand hygiene and skin antiseptic. A timeout was performed prior to the initiation of the procedure. The existing gastrojejunostomy tube was exchanged for a new 38 French gastrojejunostomy tube over a stiff glidewire. The securing balloon was insufflated with 10 cc saline. Contrast was injected. FINDINGS: The tip of the new gastrojejunostomy tube is positioned in the proximal jejunum. IMPRESSION: Successful 22 French gastrojejunostomy tube exchange. Electronically Signed   By: Marybelle Killings M.D.   On: 06/28/2016 16:19   Initial Impression / Assessment and Plan / ED Course  I have reviewed the triage vital signs and the nursing notes.  Pertinent labs & imaging results that were available during my care of the patient were reviewed by me and considered in my medical decision making (see chart for details).     7:10 PM resting comfortably. No distress. I spoke with Dr. Scot Dock, vascular surgeon via telephone. Ischemic changes in the legs appears chronic, and symptoms are chronic. Dr. Nicole Cella office will call patient to schedule sooner appointment than her scheduled appointment which is 2 months from now. No evidence of new DVT. Renal insufficiency is chronic. INR is therapeutic. Tylenol for pain.  Tdap updated Final Clinical Impressions(s) / ED Diagnoses  Diagnosis #1 chronic left leg pain #2 chronic peripheral artery disease #3 wound of left second toe #4 chronic renal insufficiency Final diagnoses:  None    New Prescriptions New Prescriptions   No medications on file     Orlie Dakin, MD 07/18/16 [id number]

## 2016-07-18 NOTE — ED Triage Notes (Signed)
Pt here for left leg pain and swelling in foot; pt noted to have boot on foot and sts hx of neuropathy; pt denies new injury

## 2016-07-18 NOTE — Telephone Encounter (Signed)
Returned call. Left vmail to return call.

## 2016-07-19 ENCOUNTER — Telehealth: Payer: Self-pay | Admitting: Vascular Surgery

## 2016-07-19 NOTE — Telephone Encounter (Signed)
spoke to daughter on home #, mailing lttr with directions for appt on 5/11 OV/US sched with [facility] new pt

## 2016-07-19 NOTE — Telephone Encounter (Signed)
-----   Message from Angelia Mould, MD sent at 07/19/2016 10:42 AM EDT ----- Regarding: RE: office visit She will need ABI's only for a new patient visit. Thanks  ----- Message ----- From: Reginia Naas Sent: 07/19/2016  10:03 AM To: Angelia Mould, MD Subject: FW: office visit                               This pt does not have a scheduled appt with our office in June, She is seeing Dr Tomi Likens Neurology and Dr Eulas Post Pulmonology. She had LE Arterial done at [facility] with no evidence of new DVT on 4/17, what studies if any will she need to be seen here? Pt has never been seen in our office and would be a new pt unless she was seen in the [place] by one of our MD's. Please advise    ----- Message ----- From: Mena Goes, RN Sent: 07/19/2016   9:26 AM To: [facility] Admin Pool Subject: FW: office visit                                 ----- Message ----- From: Angelia Mould, MD Sent: 07/18/2016   5:50 PM To: [facility] Charge Pool Subject: office visit                                   Dr. Diannia Ruder (ED) asked that this patient be seen sooner than June. She is 83 YO with left leg pain for several months. She is scheduled to be seen by one of [place] in June and he asked if she could be seen sooner. She should come in 1-3 weeks. Thanks CD

## 2016-07-19 NOTE — Telephone Encounter (Signed)
spoke to daughter on home #, mailing lttr with directions for appt on 5/11 OV/US

## 2016-07-19 NOTE — Telephone Encounter (Signed)
-----   Message from Mena Goes, RN sent at 07/19/2016  9:26 AM EDT ----- Regarding: FW: office visit   ----- Message ----- From: Angelia Mould, MD Sent: 07/18/2016   5:50 PM To: [facility] Charge Pool Subject: office visit                                   Dr. Diannia Ruder (ED) asked that this patient be seen sooner than June. She is 83 YO with left leg pain for several months. She is scheduled to be seen by one of [place] in June and he asked if she could be seen sooner. She should come in 1-3 weeks. Thanks CD

## 2016-07-20 ENCOUNTER — Telehealth: Payer: Self-pay | Admitting: Neurology

## 2016-07-20 ENCOUNTER — Other Ambulatory Visit: Payer: Self-pay

## 2016-07-20 ENCOUNTER — Ambulatory Visit: Payer: Medicare Other | Admitting: Internal Medicine

## 2016-07-20 DIAGNOSIS — I5033 Acute on chronic diastolic (congestive) heart failure: Secondary | ICD-10-CM | POA: Diagnosis not present

## 2016-07-20 DIAGNOSIS — J690 Pneumonitis due to inhalation of food and vomit: Secondary | ICD-10-CM | POA: Diagnosis not present

## 2016-07-20 DIAGNOSIS — J449 Chronic obstructive pulmonary disease, unspecified: Secondary | ICD-10-CM | POA: Diagnosis not present

## 2016-07-20 DIAGNOSIS — I130 Hypertensive heart and chronic kidney disease with heart failure and stage 1 through stage 4 chronic kidney disease, or unspecified chronic kidney disease: Secondary | ICD-10-CM | POA: Diagnosis not present

## 2016-07-20 DIAGNOSIS — E1122 Type 2 diabetes mellitus with diabetic chronic kidney disease: Secondary | ICD-10-CM | POA: Diagnosis not present

## 2016-07-20 DIAGNOSIS — I2510 Atherosclerotic heart disease of native coronary artery without angina pectoris: Secondary | ICD-10-CM | POA: Diagnosis not present

## 2016-07-20 NOTE — Telephone Encounter (Signed)
Patient's daughter is returning your call. Please call back at previous call back #. Thank you

## 2016-07-20 NOTE — Patient Outreach (Signed)
[place] [facility]) Care Management  07/20/2016  Jadin Kagel [birth year] [id number]       EMMI-CHF RED ON EMMI ALERT Day # 29 Date: 07/19/16 Red Alert Reason: "Weighed themselves today? No" "New/worsening problems? Yes" "New swelling? Yes"    Outreach attempt #1 to patient/dtr. No answer at present.     Plan: RN CM will make outreach attempt to patient within one business day.   Enzo Montgomery, RN,BSN,CCM [place] Management Telephonic Care Management Coordinator Direct Phone: [phone] Toll Free: [phone] Fax: [phone]

## 2016-07-21 ENCOUNTER — Other Ambulatory Visit: Payer: Medicare Other

## 2016-07-21 ENCOUNTER — Ambulatory Visit (INDEPENDENT_AMBULATORY_CARE_PROVIDER_SITE_OTHER): Payer: Medicare Other | Admitting: Pharmacist

## 2016-07-21 ENCOUNTER — Other Ambulatory Visit: Payer: Self-pay

## 2016-07-21 DIAGNOSIS — E039 Hypothyroidism, unspecified: Secondary | ICD-10-CM | POA: Diagnosis not present

## 2016-07-21 DIAGNOSIS — Z5181 Encounter for therapeutic drug level monitoring: Secondary | ICD-10-CM

## 2016-07-21 DIAGNOSIS — I4892 Unspecified atrial flutter: Secondary | ICD-10-CM

## 2016-07-21 DIAGNOSIS — I824Y2 Acute embolism and thrombosis of unspecified deep veins of left proximal lower extremity: Secondary | ICD-10-CM

## 2016-07-21 LAB — TSH: TSH: 4.97 mIU/L — ABNORMAL HIGH

## 2016-07-21 NOTE — Patient Outreach (Signed)
[place] [facility]) Care Management  07/21/2016  Shannon [name] [birth year] [id number]     EMMI-CHF RED ON EMMI ALERT Day # 29 Date: 07/19/16 Red Alert Reason: "Weighed themselves today? No" "New/worsening problems? Yes" "New swelling? Yes"     Outreach attempt #2 to patient/dtr. No answer at present and unable to leave message,     Plan: RN CM will make outreach attempt within one business day if no return call.   Enzo Montgomery, RN,BSN,CCM [place] Management Telephonic Care Management Coordinator Direct Phone: [phone] Toll Free: [phone] Fax: [phone]

## 2016-07-21 NOTE — Telephone Encounter (Signed)
Patient's daughter states she did not call our office and no issues going on with her mother.

## 2016-07-24 ENCOUNTER — Other Ambulatory Visit: Payer: Self-pay

## 2016-07-24 NOTE — Patient Outreach (Signed)
[place] [facility]) Care Management  07/24/2016  Kaeley Vinje Mar 20, 1924 [id number]   EMMI-CHF RED ON EMMI ALERT Day # 29 Date: 07/19/16 Red Alert Reason: "Weighed themselves today? No" "New/worsening problems? Yes" "New swelling? Yes"   Outreach attempt # 3 to patient. No answer at present.    Plan: RN CM will send unsuccessful outreach letter to patient and close case if no response within 10 business days.   Enzo Montgomery, RN,BSN,CCM [place] Management Telephonic Care Management Coordinator Direct Phone: [phone] Toll Free: [phone] Fax: [phone]

## 2016-07-25 ENCOUNTER — Other Ambulatory Visit: Payer: Self-pay

## 2016-07-25 DIAGNOSIS — J449 Chronic obstructive pulmonary disease, unspecified: Secondary | ICD-10-CM | POA: Diagnosis not present

## 2016-07-25 DIAGNOSIS — J690 Pneumonitis due to inhalation of food and vomit: Secondary | ICD-10-CM | POA: Diagnosis not present

## 2016-07-25 DIAGNOSIS — I5033 Acute on chronic diastolic (congestive) heart failure: Secondary | ICD-10-CM | POA: Diagnosis not present

## 2016-07-25 DIAGNOSIS — I2510 Atherosclerotic heart disease of native coronary artery without angina pectoris: Secondary | ICD-10-CM | POA: Diagnosis not present

## 2016-07-25 DIAGNOSIS — I130 Hypertensive heart and chronic kidney disease with heart failure and stage 1 through stage 4 chronic kidney disease, or unspecified chronic kidney disease: Secondary | ICD-10-CM | POA: Diagnosis not present

## 2016-07-25 DIAGNOSIS — E1122 Type 2 diabetes mellitus with diabetic chronic kidney disease: Secondary | ICD-10-CM | POA: Diagnosis not present

## 2016-07-25 NOTE — Patient Outreach (Signed)
[place] [facility]) Care Management  07/25/2016  Tenesha Garza 01-Jan-1924 [id number]     EMMI-CHF RED ON EMMI ALERT Day # 34 Date: 07/24/16 Red Alert Reason: "New/worsening SOB? Yes "New/worsening problems? Yes"  Day # 29 Date: 07/19/16 Red Alert Reason: "Weighed themselves today? No" "New/worsening problems? Yes" "New swelling? Yes"     RN CM has attempted multiple outreaches to address red alerts. Unable to reach letter mailed to patient and awaiting response back.        Plan: RN CM will close case out if no response from patient within 10 business days.  Enzo Montgomery, RN,BSN,CCM [place] Management Telephonic Care Management Coordinator Direct Phone: [phone] Toll Free: [phone] Fax: [phone]

## 2016-07-26 ENCOUNTER — Ambulatory Visit: Payer: Medicare Other | Admitting: Pulmonary Disease

## 2016-07-26 ENCOUNTER — Ambulatory Visit (INDEPENDENT_AMBULATORY_CARE_PROVIDER_SITE_OTHER): Payer: Medicare Other | Admitting: Cardiology

## 2016-07-26 DIAGNOSIS — Z5181 Encounter for therapeutic drug level monitoring: Secondary | ICD-10-CM

## 2016-07-26 DIAGNOSIS — I2510 Atherosclerotic heart disease of native coronary artery without angina pectoris: Secondary | ICD-10-CM | POA: Diagnosis not present

## 2016-07-26 DIAGNOSIS — I4892 Unspecified atrial flutter: Secondary | ICD-10-CM

## 2016-07-26 DIAGNOSIS — E1122 Type 2 diabetes mellitus with diabetic chronic kidney disease: Secondary | ICD-10-CM | POA: Diagnosis not present

## 2016-07-26 DIAGNOSIS — I5033 Acute on chronic diastolic (congestive) heart failure: Secondary | ICD-10-CM | POA: Diagnosis not present

## 2016-07-26 DIAGNOSIS — I130 Hypertensive heart and chronic kidney disease with heart failure and stage 1 through stage 4 chronic kidney disease, or unspecified chronic kidney disease: Secondary | ICD-10-CM | POA: Diagnosis not present

## 2016-07-26 DIAGNOSIS — J690 Pneumonitis due to inhalation of food and vomit: Secondary | ICD-10-CM | POA: Diagnosis not present

## 2016-07-26 DIAGNOSIS — J449 Chronic obstructive pulmonary disease, unspecified: Secondary | ICD-10-CM | POA: Diagnosis not present

## 2016-07-26 DIAGNOSIS — I824Y2 Acute embolism and thrombosis of unspecified deep veins of left proximal lower extremity: Secondary | ICD-10-CM

## 2016-07-26 LAB — POCT INR: INR: 2.7

## 2016-07-27 ENCOUNTER — Encounter: Payer: Self-pay | Admitting: Podiatry

## 2016-07-27 ENCOUNTER — Ambulatory Visit (INDEPENDENT_AMBULATORY_CARE_PROVIDER_SITE_OTHER): Payer: Medicare Other | Admitting: Podiatry

## 2016-07-27 VITALS — Temp 96.7°F

## 2016-07-27 DIAGNOSIS — I739 Peripheral vascular disease, unspecified: Secondary | ICD-10-CM | POA: Diagnosis not present

## 2016-07-27 DIAGNOSIS — J690 Pneumonitis due to inhalation of food and vomit: Secondary | ICD-10-CM | POA: Diagnosis not present

## 2016-07-27 DIAGNOSIS — I130 Hypertensive heart and chronic kidney disease with heart failure and stage 1 through stage 4 chronic kidney disease, or unspecified chronic kidney disease: Secondary | ICD-10-CM | POA: Diagnosis not present

## 2016-07-27 DIAGNOSIS — I96 Gangrene, not elsewhere classified: Secondary | ICD-10-CM | POA: Diagnosis not present

## 2016-07-27 DIAGNOSIS — J449 Chronic obstructive pulmonary disease, unspecified: Secondary | ICD-10-CM | POA: Diagnosis not present

## 2016-07-27 DIAGNOSIS — E1122 Type 2 diabetes mellitus with diabetic chronic kidney disease: Secondary | ICD-10-CM | POA: Diagnosis not present

## 2016-07-27 DIAGNOSIS — I70262 Atherosclerosis of native arteries of extremities with gangrene, left leg: Secondary | ICD-10-CM

## 2016-07-27 DIAGNOSIS — I2510 Atherosclerotic heart disease of native coronary artery without angina pectoris: Secondary | ICD-10-CM | POA: Diagnosis not present

## 2016-07-27 DIAGNOSIS — I5033 Acute on chronic diastolic (congestive) heart failure: Secondary | ICD-10-CM | POA: Diagnosis not present

## 2016-07-28 ENCOUNTER — Emergency Department (HOSPITAL_COMMUNITY): Payer: Medicare Other

## 2016-07-28 ENCOUNTER — Encounter: Payer: Self-pay | Admitting: Adult Health

## 2016-07-28 ENCOUNTER — Encounter: Payer: Self-pay | Admitting: Vascular Surgery

## 2016-07-28 ENCOUNTER — Ambulatory Visit (INDEPENDENT_AMBULATORY_CARE_PROVIDER_SITE_OTHER)
Admission: RE | Admit: 2016-07-28 | Discharge: 2016-07-28 | Disposition: A | Discharge status: Home / Self Care | Payer: Medicare Other | Source: Ambulatory Visit | Attending: Vascular Surgery | Admitting: Vascular Surgery

## 2016-07-28 ENCOUNTER — Telehealth: Payer: Self-pay | Admitting: *Deleted

## 2016-07-28 ENCOUNTER — Inpatient Hospital Stay (HOSPITAL_COMMUNITY): Payer: Medicare Other

## 2016-07-28 ENCOUNTER — Ambulatory Visit (INDEPENDENT_AMBULATORY_CARE_PROVIDER_SITE_OTHER): Payer: Medicare Other | Admitting: Adult Health

## 2016-07-28 ENCOUNTER — Inpatient Hospital Stay (HOSPITAL_COMMUNITY)
Admission: EM | Admit: 2016-07-28 | Discharge: 2016-08-01 | DRG: 300 | Disposition: A | Discharge status: Home Health | Payer: Medicare Other | Attending: Family Medicine | Admitting: Family Medicine

## 2016-07-28 ENCOUNTER — Encounter (HOSPITAL_COMMUNITY): Payer: Self-pay | Admitting: *Deleted

## 2016-07-28 ENCOUNTER — Other Ambulatory Visit: Payer: Self-pay | Admitting: Podiatry

## 2016-07-28 ENCOUNTER — Ambulatory Visit (INDEPENDENT_AMBULATORY_CARE_PROVIDER_SITE_OTHER): Payer: Medicare Other | Admitting: Vascular Surgery

## 2016-07-28 DIAGNOSIS — Z8673 Personal history of transient ischemic attack (TIA), and cerebral infarction without residual deficits: Secondary | ICD-10-CM

## 2016-07-28 DIAGNOSIS — L03116 Cellulitis of left lower limb: Secondary | ICD-10-CM | POA: Diagnosis not present

## 2016-07-28 DIAGNOSIS — I130 Hypertensive heart and chronic kidney disease with heart failure and stage 1 through stage 4 chronic kidney disease, or unspecified chronic kidney disease: Secondary | ICD-10-CM | POA: Diagnosis present

## 2016-07-28 DIAGNOSIS — I70262 Atherosclerosis of native arteries of extremities with gangrene, left leg: Secondary | ICD-10-CM | POA: Diagnosis not present

## 2016-07-28 DIAGNOSIS — E039 Hypothyroidism, unspecified: Secondary | ICD-10-CM | POA: Diagnosis present

## 2016-07-28 DIAGNOSIS — J4530 Mild persistent asthma, uncomplicated: Secondary | ICD-10-CM | POA: Diagnosis not present

## 2016-07-28 DIAGNOSIS — L03119 Cellulitis of unspecified part of limb: Secondary | ICD-10-CM

## 2016-07-28 DIAGNOSIS — S91302A Unspecified open wound, left foot, initial encounter: Secondary | ICD-10-CM | POA: Diagnosis not present

## 2016-07-28 DIAGNOSIS — M79672 Pain in left foot: Secondary | ICD-10-CM

## 2016-07-28 DIAGNOSIS — E11621 Type 2 diabetes mellitus with foot ulcer: Secondary | ICD-10-CM | POA: Diagnosis not present

## 2016-07-28 DIAGNOSIS — Z66 Do not resuscitate: Secondary | ICD-10-CM | POA: Diagnosis present

## 2016-07-28 DIAGNOSIS — Z79899 Other long term (current) drug therapy: Secondary | ICD-10-CM

## 2016-07-28 DIAGNOSIS — I96 Gangrene, not elsewhere classified: Secondary | ICD-10-CM

## 2016-07-28 DIAGNOSIS — I2510 Atherosclerotic heart disease of native coronary artery without angina pectoris: Secondary | ICD-10-CM | POA: Diagnosis present

## 2016-07-28 DIAGNOSIS — Z9841 Cataract extraction status, right eye: Secondary | ICD-10-CM

## 2016-07-28 DIAGNOSIS — I70269 Atherosclerosis of native arteries of extremities with gangrene, unspecified extremity: Secondary | ICD-10-CM | POA: Insufficient documentation

## 2016-07-28 DIAGNOSIS — Z9842 Cataract extraction status, left eye: Secondary | ICD-10-CM | POA: Diagnosis not present

## 2016-07-28 DIAGNOSIS — H409 Unspecified glaucoma: Secondary | ICD-10-CM | POA: Diagnosis present

## 2016-07-28 DIAGNOSIS — I4891 Unspecified atrial fibrillation: Secondary | ICD-10-CM | POA: Diagnosis present

## 2016-07-28 DIAGNOSIS — F329 Major depressive disorder, single episode, unspecified: Secondary | ICD-10-CM | POA: Diagnosis present

## 2016-07-28 DIAGNOSIS — N183 Chronic kidney disease, stage 3 (moderate): Secondary | ICD-10-CM | POA: Diagnosis present

## 2016-07-28 DIAGNOSIS — L8990 Pressure ulcer of unspecified site, unspecified stage: Secondary | ICD-10-CM | POA: Diagnosis present

## 2016-07-28 DIAGNOSIS — Z931 Gastrostomy status: Secondary | ICD-10-CM | POA: Diagnosis not present

## 2016-07-28 DIAGNOSIS — L97509 Non-pressure chronic ulcer of other part of unspecified foot with unspecified severity: Secondary | ICD-10-CM

## 2016-07-28 DIAGNOSIS — E1140 Type 2 diabetes mellitus with diabetic neuropathy, unspecified: Secondary | ICD-10-CM | POA: Diagnosis present

## 2016-07-28 DIAGNOSIS — I5032 Chronic diastolic (congestive) heart failure: Secondary | ICD-10-CM | POA: Diagnosis present

## 2016-07-28 DIAGNOSIS — Z86718 Personal history of other venous thrombosis and embolism: Secondary | ICD-10-CM | POA: Diagnosis not present

## 2016-07-28 DIAGNOSIS — E1169 Type 2 diabetes mellitus with other specified complication: Secondary | ICD-10-CM | POA: Diagnosis present

## 2016-07-28 DIAGNOSIS — J449 Chronic obstructive pulmonary disease, unspecified: Secondary | ICD-10-CM | POA: Diagnosis present

## 2016-07-28 DIAGNOSIS — E785 Hyperlipidemia, unspecified: Secondary | ICD-10-CM | POA: Diagnosis present

## 2016-07-28 DIAGNOSIS — Z961 Presence of intraocular lens: Secondary | ICD-10-CM | POA: Diagnosis present

## 2016-07-28 DIAGNOSIS — Z7901 Long term (current) use of anticoagulants: Secondary | ICD-10-CM

## 2016-07-28 DIAGNOSIS — E1122 Type 2 diabetes mellitus with diabetic chronic kidney disease: Secondary | ICD-10-CM | POA: Diagnosis present

## 2016-07-28 DIAGNOSIS — M869 Osteomyelitis, unspecified: Secondary | ICD-10-CM | POA: Diagnosis present

## 2016-07-28 DIAGNOSIS — I998 Other disorder of circulatory system: Secondary | ICD-10-CM

## 2016-07-28 DIAGNOSIS — K219 Gastro-esophageal reflux disease without esophagitis: Secondary | ICD-10-CM | POA: Diagnosis present

## 2016-07-28 DIAGNOSIS — Z880 Allergy status to penicillin: Secondary | ICD-10-CM

## 2016-07-28 DIAGNOSIS — I70245 Atherosclerosis of native arteries of left leg with ulceration of other part of foot: Secondary | ICD-10-CM | POA: Diagnosis not present

## 2016-07-28 DIAGNOSIS — L97429 Non-pressure chronic ulcer of left heel and midfoot with unspecified severity: Secondary | ICD-10-CM | POA: Diagnosis not present

## 2016-07-28 DIAGNOSIS — M1990 Unspecified osteoarthritis, unspecified site: Secondary | ICD-10-CM | POA: Diagnosis present

## 2016-07-28 DIAGNOSIS — I10 Essential (primary) hypertension: Secondary | ICD-10-CM

## 2016-07-28 DIAGNOSIS — E1152 Type 2 diabetes mellitus with diabetic peripheral angiopathy with gangrene: Secondary | ICD-10-CM | POA: Diagnosis present

## 2016-07-28 DIAGNOSIS — L97529 Non-pressure chronic ulcer of other part of left foot with unspecified severity: Secondary | ICD-10-CM | POA: Diagnosis present

## 2016-07-28 DIAGNOSIS — Z87891 Personal history of nicotine dependence: Secondary | ICD-10-CM

## 2016-07-28 LAB — PROTIME-INR
INR: 2.65
Prothrombin Time: 28.8 seconds — ABNORMAL HIGH (ref 11.4–15.2)

## 2016-07-28 LAB — CBC WITH DIFFERENTIAL/PLATELET
Basophils Absolute: 0 10*3/uL (ref 0.0–0.1)
Basophils Relative: 0 %
Eosinophils Absolute: 0.3 10*3/uL (ref 0.0–0.7)
Eosinophils Relative: 3 %
HCT: 26 % — ABNORMAL LOW (ref 36.0–46.0)
Hemoglobin: 8.9 g/dL — ABNORMAL LOW (ref 12.0–15.0)
Lymphocytes Relative: 19 %
Lymphs Abs: 1.7 10*3/uL (ref 0.7–4.0)
MCH: 29.3 pg (ref 26.0–34.0)
MCHC: 34.2 g/dL (ref 30.0–36.0)
MCV: 85.5 fL (ref 78.0–100.0)
Monocytes Absolute: 0.6 10*3/uL (ref 0.1–1.0)
Monocytes Relative: 7 %
Neutro Abs: 6.4 10*3/uL (ref 1.7–7.7)
Neutrophils Relative %: 71 %
Platelets: 530 10*3/uL — ABNORMAL HIGH (ref 150–400)
RBC: 3.04 MIL/uL — ABNORMAL LOW (ref 3.87–5.11)
RDW: 18 % — ABNORMAL HIGH (ref 11.5–15.5)
WBC: 9.1 10*3/uL (ref 4.0–10.5)

## 2016-07-28 LAB — I-STAT CHEM 8, ED
BUN: 43 mg/dL — ABNORMAL HIGH (ref 6–20)
Calcium, Ion: 1.14 mmol/L — ABNORMAL LOW (ref 1.15–1.40)
Chloride: 102 mmol/L (ref 101–111)
Creatinine, Ser: 1.3 mg/dL — ABNORMAL HIGH (ref 0.44–1.00)
Glucose, Bld: 101 mg/dL — ABNORMAL HIGH (ref 65–99)
HCT: 28 % — ABNORMAL LOW (ref 36.0–46.0)
Hemoglobin: 9.5 g/dL — ABNORMAL LOW (ref 12.0–15.0)
Potassium: 4.2 mmol/L (ref 3.5–5.1)
Sodium: 136 mmol/L (ref 135–145)
TCO2: 24 mmol/L (ref 0–100)

## 2016-07-28 LAB — C-REACTIVE PROTEIN: CRP: 12.8 mg/dL — ABNORMAL HIGH (ref <1.0)

## 2016-07-28 MED ORDER — ADULT MULTIVITAMIN W/MINERALS CH
1.0000 | ORAL_TABLET | Freq: Every day | ORAL | Status: DC
  Administered 2016-07-29 – 2016-08-01 (×4): 1 via ORAL
  Filled 2016-07-28 (×4): qty 1

## 2016-07-28 MED ORDER — GABAPENTIN 300 MG PO CAPS
300.0000 mg | ORAL_CAPSULE | Freq: Three times a day (TID) | ORAL | Status: DC
  Administered 2016-07-29 – 2016-08-01 (×12): 300 mg via ORAL
  Filled 2016-07-28 (×12): qty 1

## 2016-07-28 MED ORDER — AMLODIPINE BESYLATE 5 MG PO TABS
10.0000 mg | ORAL_TABLET | Freq: Every day | ORAL | Status: DC
  Administered 2016-07-29 – 2016-08-01 (×4): 10 mg via ORAL
  Filled 2016-07-28 (×4): qty 2

## 2016-07-28 MED ORDER — INSULIN ASPART 100 UNIT/ML ~~LOC~~ SOLN
0.0000 [IU] | Freq: Three times a day (TID) | SUBCUTANEOUS | Status: DC
  Administered 2016-07-29 – 2016-07-31 (×5): 2 [IU] via SUBCUTANEOUS
  Administered 2016-07-31 (×2): 1 [IU] via SUBCUTANEOUS
  Administered 2016-08-01 (×2): 2 [IU] via SUBCUTANEOUS
  Administered 2016-08-01: 1 [IU] via SUBCUTANEOUS

## 2016-07-28 MED ORDER — GLUCERNA SHAKE PO LIQD
237.0000 mL | ORAL | Status: DC
  Administered 2016-07-29: 237 mL via ORAL

## 2016-07-28 MED ORDER — ACETAMINOPHEN 650 MG RE SUPP: 650.0000 mg | Freq: Four times a day (QID) | RECTAL | Status: DC | PRN

## 2016-07-28 MED ORDER — INSULIN GLARGINE 100 UNIT/ML ~~LOC~~ SOLN
5.0000 [IU] | Freq: Every day | SUBCUTANEOUS | Status: DC
  Administered 2016-07-29 – 2016-08-01 (×3): 5 [IU] via SUBCUTANEOUS
  Filled 2016-07-28 (×5): qty 0.05

## 2016-07-28 MED ORDER — LOSARTAN POTASSIUM 50 MG PO TABS
50.0000 mg | ORAL_TABLET | Freq: Every day | ORAL | Status: DC
  Administered 2016-07-29 – 2016-08-01 (×4): 50 mg via ORAL
  Filled 2016-07-28 (×4): qty 1

## 2016-07-28 MED ORDER — SODIUM CHLORIDE 0.9 % IV SOLN
1250.0000 mg | INTRAVENOUS | Status: DC
  Administered 2016-07-28: 1250 mg via INTRAVENOUS
  Filled 2016-07-28: qty 1250

## 2016-07-28 MED ORDER — LEVOTHYROXINE SODIUM 137 MCG PO TABS: 137.0000 ug | ORAL_TABLET | Freq: Every day | ORAL | Status: DC

## 2016-07-28 MED ORDER — FLUTICASONE PROPIONATE 50 MCG/ACT NA SUSP
2.0000 | Freq: Every day | NASAL | Status: DC
  Administered 2016-07-29 – 2016-08-01 (×4): 2 via NASAL
  Filled 2016-07-28: qty 16

## 2016-07-28 MED ORDER — SERTRALINE HCL 100 MG PO TABS
100.0000 mg | ORAL_TABLET | Freq: Every day | ORAL | Status: DC
  Administered 2016-07-29 – 2016-08-01 (×4): 100 mg via ORAL
  Filled 2016-07-28 (×4): qty 1

## 2016-07-28 MED ORDER — IPRATROPIUM-ALBUTEROL 0.5-2.5 (3) MG/3ML IN SOLN: 3.0000 mL | Freq: Four times a day (QID) | RESPIRATORY_TRACT | Status: DC | PRN

## 2016-07-28 MED ORDER — WARFARIN - PHARMACIST DOSING INPATIENT: Freq: Every day | Status: DC

## 2016-07-28 MED ORDER — TRAMADOL HCL 50 MG PO TABS: 50.0000 mg | ORAL_TABLET | Freq: Four times a day (QID) | ORAL | Status: DC | PRN

## 2016-07-28 MED ORDER — BISACODYL 10 MG RE SUPP: 10.0000 mg | Freq: Every day | RECTAL | Status: DC | PRN

## 2016-07-28 MED ORDER — ACETAMINOPHEN 325 MG PO TABS
650.0000 mg | ORAL_TABLET | Freq: Four times a day (QID) | ORAL | Status: DC | PRN
  Administered 2016-07-31: 650 mg via ORAL
  Filled 2016-07-28: qty 2

## 2016-07-28 MED ORDER — FREE WATER
230.0000 mL | Freq: Three times a day (TID) | Status: DC
  Administered 2016-07-29 – 2016-08-01 (×12): 230 mL

## 2016-07-28 MED ORDER — METRONIDAZOLE IN NACL 5-0.79 MG/ML-% IV SOLN
500.0000 mg | Freq: Three times a day (TID) | INTRAVENOUS | Status: DC
  Administered 2016-07-29 – 2016-07-31 (×7): 500 mg via INTRAVENOUS
  Filled 2016-07-28 (×7): qty 100

## 2016-07-28 MED ORDER — ATORVASTATIN CALCIUM 10 MG PO TABS
10.0000 mg | ORAL_TABLET | Freq: Every day | ORAL | Status: DC
  Administered 2016-07-29 – 2016-08-01 (×4): 10 mg via ORAL
  Filled 2016-07-28 (×4): qty 1

## 2016-07-28 MED ORDER — WARFARIN SODIUM 2 MG PO TABS: 2.0000 mg | ORAL_TABLET | Freq: Once | ORAL | Status: DC

## 2016-07-28 MED ORDER — BACITRACIN-POLYMYXIN B 500-10000 UNIT/GM OP OINT
1.0000 "application " | TOPICAL_OINTMENT | Freq: Two times a day (BID) | OPHTHALMIC | Status: DC
  Administered 2016-07-29 – 2016-08-01 (×8): 1 via OPHTHALMIC
  Filled 2016-07-28: qty 3.5

## 2016-07-28 MED ORDER — SODIUM CHLORIDE 0.9% FLUSH
3.0000 mL | Freq: Two times a day (BID) | INTRAVENOUS | Status: DC
  Administered 2016-07-29 – 2016-08-01 (×6): 3 mL via INTRAVENOUS

## 2016-07-28 MED ORDER — POLYETHYLENE GLYCOL 3350 17 G PO PACK: 17.0000 g | PACK | Freq: Every day | ORAL | Status: DC | PRN

## 2016-07-28 MED ORDER — ONDANSETRON HCL 4 MG/2ML IJ SOLN
4.0000 mg | Freq: Four times a day (QID) | INTRAMUSCULAR | Status: DC | PRN
Start: 2016-07-28 — End: 2016-08-01

## 2016-07-28 MED ORDER — CHLORHEXIDINE GLUCONATE 0.12 % MT SOLN
15.0000 mL | Freq: Two times a day (BID) | OROMUCOSAL | Status: DC
  Administered 2016-07-29 – 2016-08-01 (×8): 15 mL via OROMUCOSAL
  Filled 2016-07-28 (×8): qty 15

## 2016-07-28 MED ORDER — CYCLOSPORINE 0.05 % OP EMUL
1.0000 [drp] | Freq: Two times a day (BID) | OPHTHALMIC | Status: DC
  Administered 2016-07-29 – 2016-08-01 (×8): 1 [drp] via OPHTHALMIC
  Filled 2016-07-28 (×9): qty 1

## 2016-07-28 MED ORDER — RESOURCE THICKENUP CLEAR PO POWD
6.0000 g | ORAL | Status: DC | PRN
  Filled 2016-07-28: qty 125

## 2016-07-28 MED ORDER — VANCOMYCIN HCL IN DEXTROSE 1-5 GM/200ML-% IV SOLN: 1000.0000 mg | Freq: Once | INTRAVENOUS | Status: DC

## 2016-07-28 MED ORDER — LATANOPROST 0.005 % OP SOLN
1.0000 [drp] | Freq: Every day | OPHTHALMIC | Status: DC
  Administered 2016-07-29 – 2016-08-01 (×4): 1 [drp] via OPHTHALMIC
  Filled 2016-07-28: qty 2.5

## 2016-07-28 MED ORDER — ONDANSETRON HCL 4 MG PO TABS: 4.0000 mg | ORAL_TABLET | Freq: Four times a day (QID) | ORAL | Status: DC | PRN

## 2016-07-28 MED ORDER — FUROSEMIDE 20 MG PO TABS
20.0000 mg | ORAL_TABLET | Freq: Every day | ORAL | Status: DC
  Administered 2016-07-29: 20 mg via ORAL
  Filled 2016-07-28: qty 1

## 2016-07-28 MED ORDER — DEXTROSE 5 % IV SOLN
1.0000 g | INTRAVENOUS | Status: DC
  Administered 2016-07-29 – 2016-07-30 (×3): 1 g via INTRAVENOUS
  Filled 2016-07-28 (×3): qty 1

## 2016-07-28 MED ORDER — POLYVINYL ALCOHOL 1.4 % OP SOLN
1.0000 [drp] | Freq: Three times a day (TID) | OPHTHALMIC | Status: DC
  Administered 2016-07-29 – 2016-08-01 (×16): 1 [drp] via OPHTHALMIC
  Filled 2016-07-28: qty 15

## 2016-07-28 NOTE — Progress Notes (Addendum)
ANTICOAGULATION CONSULT NOTE - Initial Consult  Pharmacy Consult for Warfarin  Indication: atrial fibrillation and DVT  Patient Measurements: Height: 5\' 6"  (167.6 cm) Weight: 144 lb (65.3 kg) IBW/kg (Calculated) : 59.3 Vital Signs: Temp: 97.6 F (36.4 C) (04/27 1615) Temp Source: Oral (04/27 1615) BP: 165/69 (04/27 2230) Pulse Rate: 89 (04/27 2230)  Labs:  Recent Labs  07/26/16 07/28/16 2032 07/28/16 2039 07/28/16 2229  HGB  --  8.9* 9.5*  --   HCT  --  26.0* 28.0*  --   PLT  --  530*  --   --   LABPROT  --   --   --  28.8*  INR 2.7  --   --  2.65  CREATININE  --   --  1.30*  --     Estimated Creatinine Clearance: 26.4 mL/min (A) (by C-G formula based on SCr of 1.3 mg/dL (H)).   Medical History: Past Medical History:  Diagnosis Date  . Anemia    previous blood transfusions  . Arthritis    "all over"  . Asthma   . Bradycardia    requiring previous d/c of BB and reduction of amiodarone  . CAD (coronary artery disease)    nonobstructive per notes  . Chronic diastolic CHF (congestive heart failure) ([place])   . CKD (chronic kidney disease), stage III   . Complication of blood transfusion    "got the wrong blood type at [facility] in ~ 2015; no adverse reaction that we are aware of"/daughter, [name] (01/27/2016)  . COPD (chronic obstructive pulmonary disease) ([place])   . Depression    "light case"  . DVT (deep venous thrombosis) ([place]) 01/2016   a. LLE DVT 01/2016 - switched from Eliquis to Coumadin.  . Gastric stenosis    a. s/p stomach tube  . GERD (gastroesophageal reflux disease)   . History of blood transfusion    "several" (01/27/2016)  . History of stomach ulcers   . Hyperlipidemia   . Hypertension   . Hypothyroidism   . Paraesophageal hernia   . Perforated gastric ulcer ([place])   . Seasonal allergies   . SIADH (syndrome of inappropriate ADH production) ([place])    [name] 01/10/2015  . Small bowel obstruction ([place])    "I don't know how many" (01/11/2015)  .  Stroke ([place])    "light one"  . Type II diabetes mellitus ([place])    "related to prednisone use  for > 20 yr; once predinose stopped; no more DM RX" (01/27/2016)  . Ventral hernia with bowel obstruction    Assessment:  83 y/o F on warfarin PTA, here for foot/leg pain, INR is therapeutic at 2.65, Hgb 9.5.  Warfarin PTA dosing: 2.5 mg on mondays, 3.75 mg all other days  Noted DDI with planned Flagyl, will empirically reduce warfarin dose during this.   Goal of Therapy:  INR 2-3 Monitor platelets by anticoagulation protocol: Yes   Plan:  -Warfarin 2 mg x 1 at 1800 on 4/28 (empiric dose reduction with flagyl) -Daily PT/INR -Monitor for bleeding  [name] 07/28/2016,11:30 PM

## 2016-07-28 NOTE — ED Notes (Signed)
Pt's daughter would like to be called with any updates. Soledad Gerlach - [phone].

## 2016-07-28 NOTE — ED Notes (Signed)
Patient transported to MRI 

## 2016-07-28 NOTE — Telephone Encounter (Addendum)
Dr. Amalia Hailey states pt has been advised to go to ED, pt refuses. Dr. Amalia Hailey states get pt in as soon as possible for ABI with TBI, Arterial dopplers for evaluation of sudden swelling left foot, ischemic pain and black left 2nd toe. 07/28/2016-[facility] - [place] - Radiology Medicine states Rollene Fare is out of the office, so she will call around to see who can get pt in as soon as possible. Rip Harbour - [facility] states they have pt in today at 2:00pm to see Dr. Bridgett Larsson and to have ABI with or without TBI and he will schedule other testing as needed after evaluation. Rip Harbour - [facility] states she has contacted pt's dtr.pt's dtr, Soledad Gerlach called and call was dropped.I called [name] and she states she had been contacted by [facility] and they would be going there after her current appt.08/02/2016-[facility] [place] states pt was admitted to the hospital Friday, and received IV antibiotics and was discharged with oral Cleocin, and no wound care instructions other than to soak the area and cover with Kerlix. Kem Parcher feels pt would benefit from being seen by the [facility] or with different wound care orders. 08/03/2016-I informed pt's dtr

## 2016-07-28 NOTE — Assessment & Plan Note (Signed)
?  mild Asthma -appears controlled with nebs  Cont on current regimen   Plan  Patient Instructions  Continue on current regimen  Follow up with Dr. Vaughan Browner in 6 months and As needed

## 2016-07-28 NOTE — H&P (Signed)
History and Physical    Shannon [name] WUJ:[id number] DOB: [birth year] DOA: 07/28/2016  PCP: Kirt Boys, DO   Patient coming from: [facility]  Chief Complaint: Referred to the ED from Vascular Clinic for management of diabetic foot ulcer, gangrene  HPI: Shannon [name] is a 83 y.o. woman with multiple medical problems including HTN, HLD, Type 2 DM, prior CVA, chronic diastolic heart failure, gastric outlet obstruction S/P G-tube placement, hypothyroidism, symptomatic bradycardia (she declined PPM), CKD 3, GERD, COPD, and DVT who was referred to the ED from Dr. 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  The patient cannot tell me how long she has been having problems with this foot, but she reports acute changes in this foot on Tuesday.  She denies any trauma or acute injury.  She reports pain in the left foot with increased drainage and swelling.  No fever.  She had one episode of nausea yesterday.  No light-headedness.  No chest pain or shortness of breath.  No dysuria.  She has subacute pain in her left jaw.  She has evidence of peripheral arterial disease based on LLE arterial duplex done in Dr. Nicky Pugh office today.  She is anticoagulated with warfarin at baseline (history of atrial fibrillation, CHADS-Vasc 6).  INR therapeutic.  ED Course: WBC count 9.  Hgb 8.9.  Platelet count 530.  BUN 43.  Creatinine 1.3.  Foot xray did not show acute fracture or definitive evidence of osteomyelitis.  IV vancomycin was ordered.  Hospitalist asked to admit.  Review of Systems: As per HPI otherwise 10 systems reviewed and negative.   Past Medical History:  Diagnosis Date  . Anemia    previous blood transfusions  . Arthritis    "all over"  . Asthma   . Bradycardia    requiring previous d/c of BB and reduction of amiodarone  . CAD (coronary artery disease)    nonobstructive per notes  . Chronic diastolic  CHF (congestive heart failure) (HCC)   . CKD (chronic kidney disease), stage III   . Complication of blood transfusion    "got the wrong blood type at [facility] in ~ 2015; no adverse reaction that we are aware of"/daughter, Shannon [name] (01/27/2016)  . COPD (chronic obstructive pulmonary disease) (HCC)   . Depression    "light case"  . DVT (deep venous thrombosis) (HCC) 01/2016   a. LLE DVT 01/2016 - switched from Eliquis to Coumadin.  . Gastric stenosis    a. s/p stomach tube  . GERD (gastroesophageal reflux disease)   . History of blood transfusion    "several" (01/27/2016)  . History of stomach ulcers   . Hyperlipidemia   . Hypertension   . Hypothyroidism   . Paraesophageal hernia   . Perforated gastric ulcer (HCC)   . Seasonal allergies   . SIADH (syndrome of inappropriate ADH production) (HCC)    [name] 01/10/2015  . Small bowel obstruction (HCC)    "I don't know how many" (01/11/2015)  . Stroke (HCC)    "light one"  . Type II diabetes mellitus (HCC)    "related to prednisone use  for > 20 yr; once predinose stopped; no more DM RX" (01/27/2016)  . Ventral hernia with bowel obstruction     Past Surgical History:  Procedure Laterality Date  . CATARACT EXTRACTION W/ INTRAOCULAR LENS  IMPLANT, BILATERAL    . CHOLECYSTECTOMY OPEN    . COLECTOMY    .  ESOPHAGOGASTRODUODENOSCOPY N/A 01/19/2014   Procedure: ESOPHAGOGASTRODUODENOSCOPY (EGD);  Surgeon: Hilarie Fredrickson, MD;  Location: [facility];  Service: Endoscopy;  Laterality: N/A;  . ESOPHAGOGASTRODUODENOSCOPY N/A 01/20/2014   Procedure: ESOPHAGOGASTRODUODENOSCOPY (EGD);  Surgeon: Hilarie Fredrickson, MD;  Location: [facility];  Service: Endoscopy;  Laterality: N/A;  . ESOPHAGOGASTRODUODENOSCOPY N/A 03/19/2014   Procedure: ESOPHAGOGASTRODUODENOSCOPY (EGD);  Surgeon: Rachael Fee, MD;  Location: [facility];  Service: Endoscopy;  Laterality: N/A;  . ESOPHAGOGASTRODUODENOSCOPY N/A 07/08/2015   Procedure: ESOPHAGOGASTRODUODENOSCOPY (EGD);   Surgeon: Sherrilyn Rist, MD;  Location: [facility] ENDOSCOPY;  Service: Endoscopy;  Laterality: N/A;  . ESOPHAGOGASTRODUODENOSCOPY (EGD) WITH PROPOFOL N/A 09/15/2015   Procedure: ESOPHAGOGASTRODUODENOSCOPY (EGD) WITH PROPOFOL;  Surgeon: Ruffin Frederick, MD;  Location: [facility] ENDOSCOPY;  Service: Gastroenterology;  Laterality: N/A;  . GASTROJEJUNOSTOMY     hx/notes 01/10/2015  . GASTROJEJUNOSTOMY N/A 09/23/2015   Procedure: OPEN GASTROJEJUNOSTOMY TUBE PLACEMENT;  Surgeon: De Blanch Kinsinger, MD;  Location: [facility] ORS;  Service: General;  Laterality: N/A;  . GLAUCOMA SURGERY Bilateral   . HERNIA REPAIR  2015  . IR GENERIC HISTORICAL  01/07/2016   IR GJ TUBE CHANGE 01/07/2016 Malachy Moan, MD [facility]  . IR GENERIC HISTORICAL  01/27/2016   IR MECH REMOV OBSTRUC MAT ANY COLON TUBE W/FLUORO 01/27/2016 Richarda Overlie, MD [facility]  . IR GENERIC HISTORICAL  02/07/2016   IR PATIENT EVAL TECH 0-60 MINS Darrell K Allred, PA-C [facility]  . IR GENERIC HISTORICAL  02/08/2016   IR GJ TUBE CHANGE 02/08/2016 Berdine Dance, MD [facility]  . IR GENERIC HISTORICAL  01/06/2016   IR GJ TUBE CHANGE 01/06/2016 [facility] OUT REF  . IR GENERIC HISTORICAL  05/02/2016   IR CM INJ ANY COLONIC TUBE W/FLUORO 05/02/2016 Oley Balm, MD [facility]  . IR GENERIC HISTORICAL  05/15/2016   IR GJ TUBE CHANGE 05/15/2016 Simonne Come, MD [facility]  . IR GENERIC HISTORICAL  06/28/2016   IR GJ TUBE CHANGE 06/28/2016 [facility]  . LAPAROTOMY N/A 01/20/2015   Procedure: EXPLORATORY LAPAROTOMY;  Surgeon: Abigail Miyamoto, MD;  Location: [facility] OR;  Service: General;  Laterality: N/A;  . LYSIS OF ADHESION N/A 01/20/2015   Procedure: LYSIS OF ADHESIONS < 1 HOUR;  Surgeon: Abigail Miyamoto, MD;  Location: [facility] OR;  Service: General;  Laterality: N/A;  . TONSILLECTOMY    . TUBAL LIGATION    . VENTRAL HERNIA REPAIR  2015   incarcerated ventral hernia ([facility] 09/2013)/notes 01/10/2015     reports that she has never smoked. She has quit  using smokeless tobacco. Her smokeless tobacco use included Snuff. She reports that she does not drink alcohol or use drugs.  She lives with her daughter.  Allergies  Allergen Reactions  . Penicillins Itching, Rash and Other (See Comments)    Tolerated unasyn, zosyn & cephalosporins in the past.  Has patient had a PCN reaction causing immediate rash, facial/tongue/throat swelling, SOB or lightheadedness with hypotension: Yes Has patient had a PCN reaction causing severe rash involving mucus membranes or skin necrosis: Unk Has patient had a PCN reaction that required hospitalization: Unk Has patient had a PCN reaction occurring within the last 10 years: Unk If all of the above answers are "NO", then may proceed with Cephalosporins    Family History  Problem Relation Age of Onset  . Stroke Mother   . Hypertension Mother   . Diabetes Brother   . Heart attack Neg Hx      Prior to Admission medications  Medication Sig Start Date End Date Taking? Authorizing Provider  acetaminophen (TYLENOL) 325 MG tablet Take 325-650 mg by mouth 2 (two) times daily as needed for mild pain.     Historical Provider, MD  AMINO ACIDS-PROTEIN HYDROLYS PO Take 30 mLs by mouth 2 (two) times daily.    Historical Provider, MD  amLODipine (NORVASC) 10 MG tablet TAKE 1 TABLET BY MOUTH DAILY 07/10/16   Dyann Kief, PA-C  atorvastatin (LIPITOR) 10 MG tablet TAKE ONE TABLET BY MOUTH ONCE DAILY 05/22/16   Kirt Boys, DO  b complex vitamins tablet Take 1 tablet by mouth daily.     Historical Provider, MD  bacitracin-polymyxin b (POLYSPORIN) ophthalmic ointment Place 1 application into both eyes 2 (two) times daily. apply to eye every 12 hours while awake    Historical Provider, MD  bisacodyl (DULCOLAX) 10 MG suppository Place 1 suppository (10 mg total) rectally daily as needed for moderate constipation. 09/30/15   Calvert Cantor, MD  calcium-vitamin D (OSCAL WITH D) 500-200 MG-UNIT per tablet Take 1 tablet by mouth  daily with breakfast.     Historical Provider, MD  chlorhexidine (PERIDEX) 0.12 % solution Use as directed 15 mLs in the mouth or throat 2 (two) times daily.    Historical Provider, MD  Chlorphen-PE-Acetaminophen (SINUS CONGESTION/PAIN DAY/NGHT PO) Take 1 capsule by mouth daily as needed (for sinus irritation).     Historical Provider, MD  doxycycline (VIBRAMYCIN) 100 MG capsule TAKE ONE CAPSULE BY MOUTH TWICE A DAY FOR 10 DAYS 07/21/16   Historical Provider, MD  feeding supplement, GLUCERNA SHAKE, (GLUCERNA SHAKE) LIQD Take 237 mLs by mouth daily.     Historical Provider, MD  fluconazole (DIFLUCAN) 200 MG tablet Take 1 tablet (200 mg total) by mouth daily. 06/19/16   Loren Racer, MD  fluticasone (FLONASE) 50 MCG/ACT nasal spray Place 2 sprays into both nostrils daily. 08/07/14   Kirt Boys, DO  furosemide (LASIX) 20 MG tablet TAKE 1 TABLET BY MOUTH DAILY 07/14/16   Dyann Kief, PA-C  gabapentin (NEURONTIN) 300 MG capsule 3 (three) times daily. Take 1 by mouth twice daily, if pain not improved after 7 days increase to three times daily, start date 07/13/16     Historical Provider, MD  Insulin Glargine (LANTUS SOLOSTAR) 100 UNIT/ML Solostar Pen Inject 5 Units into the skin daily at 10 pm. 06/02/16   Kirt Boys, DO  ipratropium-albuterol (DUONEB) 0.5-2.5 (3) MG/3ML SOLN Take 3 mLs by nebulization every 6 (six) hours as needed (sob). Use 3 times daily x 4 days then every 6 hours as needed. 03/21/16   Janetta Hora, PA-C  latanoprost (XALATAN) 0.005 % ophthalmic solution Place 1 drop into both eyes at bedtime. 06/02/14   Oneal Grout, MD  levalbuterol (XOPENEX HFA) 45 MCG/ACT inhaler Inhale 2 puffs into the lungs every 6 (six) hours as needed for wheezing or shortness of breath. 02/03/16   Kirt Boys, DO  levothyroxine (SYNTHROID, LEVOTHROID) 137 MCG tablet TAKE 1 TABLET (137 MCG TOTAL) BY MOUTH DAILY BEFORE BREAKFAST. 04/04/16   Kirt Boys, DO  losartan (COZAAR) 50 MG tablet Take 50 mg by  mouth daily. 06/16/16   Historical Provider, MD  Maltodextrin-Xanthan Gum (RESOURCE THICKENUP CLEAR) POWD Take 120 g by mouth as needed (use to thicken liquids.). 11/11/15   Rodolph Bong, MD  Multiple Vitamin (MULTIVITAMIN WITH MINERALS) TABS tablet Take 1 tablet by mouth daily.    Historical Provider, MD  Nutritional Supplements (FEEDING SUPPLEMENT, OSMOLITE 1.5 CAL,) LIQD Place 1,000  mLs into feeding tube daily. 09/30/15   Calvert Cantor, MD  Polyethyl Glycol-Propyl Glycol 0.4-0.3 % SOLN Place 1 drop into both eyes 4 (four) times daily.     Historical Provider, MD  polyethylene glycol (MIRALAX / GLYCOLAX) packet Take 17 g by mouth daily as needed for mild constipation.    Historical Provider, MD  RESTASIS 0.05 % ophthalmic emulsion USE 1 DROP INTO BOTH EYES TWICE DAILY 06/28/16   Kimber Relic, MD  sertraline (ZOLOFT) 100 MG tablet TAKE ONE TABLET BY MOUTH ONCE DAILY FOR DEPRESSION 04/10/16   Kirt Boys, DO  traMADol (ULTRAM) 50 MG tablet Take 50 mg by mouth See admin instructions. Every 4-6 hours as needed for pain 05/01/16   Historical Provider, MD  warfarin (COUMADIN) 2.5 MG tablet TAKE AS DIRECTED BY COUMADIN CLINIC. 06/22/16   Lars Masson, MD  Water For Irrigation, Sterile (FREE WATER) SOLN Place 230 mLs into feeding tube 3 (three) times daily.    Historical Provider, MD    Physical Exam: Vitals:   07/28/16 1615 07/28/16 1616 07/28/16 2015 07/28/16 2230  BP: (!) 150/68  (!) 171/78 (!) 165/69  Pulse: 88  91 89  Resp: 16  18 18   Temp: 97.6 F (36.4 C)     TempSrc: Oral     SpO2: 99%  100% 100%  Weight:  65.3 kg (144 lb)    Height:  5\' 6"  (1.676 m)        Constitutional: NAD, calm, comfortable, frail appearing  Vitals:   07/28/16 1615 07/28/16 1616 07/28/16 2015 07/28/16 2230  BP: (!) 150/68  (!) 171/78 (!) 165/69  Pulse: 88  91 89  Resp: 16  18 18   Temp: 97.6 F (36.4 C)     TempSrc: Oral     SpO2: 99%  100% 100%  Weight:  65.3 kg (144 lb)    Height:  5\' 6"  (1.676 m)      Eyes: PERRL, lids and conjunctivae normal ENMT: Mucous membranes are moist. Posterior pharynx clear of any exudate or lesions. Edentulous. Neck: normal appearance, supple, no masses Respiratory: clear to auscultation bilaterally, no wheezing, no crackles. Normal respiratory effort. No accessory muscle use.  Cardiovascular: Normal rate, regular rhythm, no murmurs / rubs / gallops. 1+ edema in bilateral lower extremities.  Cannot find palpable pulse in her feet. GI: abdomen is soft and compressible.  Surgical scars present.  G-tube to LUQ.  No distention.  No tenderness.  Bowel sounds are present. Musculoskeletal:  No joint deformity in upper and lower extremities. Good ROM, no contractures. Normal muscle tone.  Skin: no rashes, waxy appearance in lower extremities.  At her left foot, the second digit is gangrenous however there is edema involving her entire forefoot and all of her toes.  She has ulceration in the skin overlying the toes.  No significant drainage for me. Neurologic: CN 2-12 grossly intact. Generalized weakness. Psychiatric: Normal judgment and insight. Alert and oriented x 3. Normal mood.     Labs on Admission: I have personally reviewed following labs and imaging studies  CBC:  Recent Labs Lab 07/28/16 2032 07/28/16 2039  WBC 9.1  --   NEUTROABS 6.4  --   HGB 8.9* 9.5*  HCT 26.0* 28.0*  MCV 85.5  --   PLT 530*  --    Basic Metabolic Panel:  Recent Labs Lab 07/28/16 2039  NA 136  K 4.2  CL 102  GLUCOSE 101*  BUN 43*  CREATININE 1.30*   GFR:  Estimated Creatinine Clearance: 26.4 mL/min (A) (by C-G formula based on SCr of 1.3 mg/dL (H)).   Recent Labs Lab 07/26/16 07/28/16 2229  INR 2.7 2.65    Radiological Exams on Admission: Dg Foot Complete Left  Result Date: 07/28/2016 CLINICAL DATA:  83 y/o F; increasing foot pain in the plantar and anterior site of the foot. EXAM: LEFT FOOT - COMPLETE 3+ VIEW COMPARISON:  08/26/2014 foot radiographs  FINDINGS: Bones are demineralized. Hallux valgus. Lisfranc alignment is maintained. Pes planus. No acute fracture identified. Plantar calcaneal enthesophyte. Vascular calcifications. IMPRESSION: No acute fracture identified. Hallux valgus. Pes planus. Plantar calcaneal enthesophyte. Electronically Signed   By: Mitzi Hansen M.D.   On: 07/28/2016 21:10    Assessment/Plan Principal Problem:   Diabetic foot ulcer (HCC) Active Problems:   Essential hypertension, benign   GERD (gastroesophageal reflux disease)   Warfarin anticoagulation   Atherosclerosis of native arteries of the extremities with gangrene (HCC)   Gangrene (HCC)      Diabetic foot ulcer with possible underlying abscess, osteomyelitis, and gangrene of her second toe on the left foot with PAD --Empiric vancomycin, cefepime, and flagyl for now --Blood cultures --MRI of left foot without contrast --Check sed rate and CRP --She will need vascular surgery consult in the AM (though the patient is adamant that she cannot undergo any type of surgery due to her underlying heart and lung disease).  Diabetes --Full liquid diet with nectar thickened liquids --Osmolite 1.5 Cal qHS 8p-8a at 65cc/hr --POC glucose testing AC/HS --Continue home dose of lantus --Free water flushes per tube per home regimen  HTN --Cozaar, amlodipine  Hypothyroidism --Levothyroxine  Glaucoma --Continue home eyedrops  History of CVA --On statin, anticoagulated with warfarin  Chronic anticoagulation --Pharmacy to manage; will not reverse for now since it is not clear that the patient will even agree to surgery at this point --Daily PT/INR  Of note, fluconazole on home med list and I am not sure of the indication; this medication has been held for now.  DVT prophylaxis: Anticoagulated with warfarin Code Status: DNR Family Communication: Patient alone in the ED; I spoke to her daughter by phone. Disposition Plan: To be  determined. Consults called: NONE.  Vascular surgery will need to be called in the AM. Admission status: Inpatient, telemetry.  I expect the patient will need inpatient services for greater than two midnights.   TIME SPENT: 60 minutes   Jerene Bears MD [facility] Pager [phone]  If 7PM-7AM, please contact night-coverage [email or web address] Password [facility]  07/28/2016, 11:25 PM

## 2016-07-28 NOTE — Progress Notes (Signed)
Pharmacy Antibiotic Note  Shannon [name] is a 83 y.o. female admitted on 07/28/2016 with diabetic foot wound.  Pharmacy has been consulted for Cefepime dosing. WBC WNL. Noted renal dysfunction.  Plan: -Cefepime 1g IV q24h -Already on vancomycin/flagyl  Height: 5\' 6"  (167.6 cm) Weight: 144 lb (65.3 kg) IBW/kg (Calculated) : 59.3  Temp (24hrs), Avg:97.2 F (36.2 C), Min:96.7 F (35.9 C), Max:97.6 F (36.4 C)   Recent Labs Lab 07/28/16 2032 07/28/16 2039  WBC 9.1  --   CREATININE  --  1.30*    Estimated Creatinine Clearance: 26.4 mL/min (A) (by C-G formula based on SCr of 1.3 mg/dL (H)).    Allergies  Allergen Reactions  . Penicillins Itching, Rash and Other (See Comments)    Tolerated unasyn, zosyn & cephalosporins in the past.  Has patient had a PCN reaction causing immediate rash, facial/tongue/throat swelling, SOB or lightheadedness with hypotension: Yes Has patient had a PCN reaction causing severe rash involving mucus membranes or skin necrosis: Unk Has patient had a PCN reaction that required hospitalization: Unk Has patient had a PCN reaction occurring within the last 10 years: Unk If all of the above answers are "NO", then may proceed with Cephalosporins    Shannon [name] 07/28/2016 11:28 PM

## 2016-07-28 NOTE — ED Notes (Signed)
Patient transported to X-ray 

## 2016-07-28 NOTE — Progress Notes (Signed)
@Patient  ID: Shannon [name], female    DOB: [birth year], 83 y.o.   MRN: [id number]  No chief complaint on file.   Referring provider: Kirt Boys, DO  HPI: 83 yo female never smoker followed for dyspnea which is felt to be multifactorial with congestive heart failure, chronic kidney disease, volume overload, and possible chronic asthma   TEST  Echo 01/28/16 Normal LV systolic function; grade 1 diastolic dysfunction; mild AI; mild MR; moderate LAE.  07/28/2016 Follow up : Dyspnea ?Chronic asthma  Patient returns for a two-month follow-up. Patient says overall that her breathing is doing better. She is using her duoneb 2-3 times a day. Patient denies any flare of cough or wheezing. She denies any increased lower extremity swelling..  Patient has been dealing with her left foot ulcerations. She has been referred to a vascular surgeon. She remains in left foot boot.    Allergies  Allergen Reactions  . Penicillins Itching, Rash and Other (See Comments)    Tolerated unasyn, zosyn & cephalosporins in the past.  Has patient had a PCN reaction causing immediate rash, facial/tongue/throat swelling, SOB or lightheadedness with hypotension: Yes Has patient had a PCN reaction causing severe rash involving mucus membranes or skin necrosis: Unk Has patient had a PCN reaction that required hospitalization: Unk Has patient had a PCN reaction occurring within the last 10 years: Unk If all of the above answers are "NO", then may proceed with Cephalosporins    Immunization History  Administered Date(s) Administered  . Influenza,inj,Quad PF,36+ Mos 12/24/2013, 01/27/2015, 12/29/2015  . Influenza-Unspecified 12/02/2012  . Pneumococcal Conjugate-13 02/03/2014  . Tdap 07/18/2016  . Zoster 04/12/2011    Past Medical History:  Diagnosis Date  . Anemia    previous blood transfusions  . Arthritis    "all over"  . Asthma   . Bradycardia    requiring previous d/c of BB and reduction of  amiodarone  . CAD (coronary artery disease)    nonobstructive per notes  . Chronic diastolic CHF (congestive heart failure) (HCC)   . CKD (chronic kidney disease), stage III   . Complication of blood transfusion    "got the wrong blood type at [facility] in ~ 2015; no adverse reaction that we are aware of"/daughter, [name] (01/27/2016)  . COPD (chronic obstructive pulmonary disease) (HCC)   . Depression    "light case"  . DVT (deep venous thrombosis) (HCC) 01/2016   a. LLE DVT 01/2016 - switched from Eliquis to Coumadin.  . Gastric stenosis    a. s/p stomach tube  . GERD (gastroesophageal reflux disease)   . History of blood transfusion    "several" (01/27/2016)  . History of stomach ulcers   . Hyperlipidemia   . Hypertension   . Hypothyroidism   . Paraesophageal hernia   . Perforated gastric ulcer (HCC)   . Seasonal allergies   . SIADH (syndrome of inappropriate ADH production) (HCC)    [name] 01/10/2015  . Small bowel obstruction (HCC)    "I don't know how many" (01/11/2015)  . Stroke (HCC)    "light one"  . Type II diabetes mellitus (HCC)    "related to prednisone use  for > 20 yr; once predinose stopped; no more DM RX" (01/27/2016)  . Ventral hernia with bowel obstruction     Tobacco History: History  Smoking Status  . Never Smoker  Smokeless Tobacco  . Former Neurosurgeon  . Types: Snuff    Comment: "used snuff in my younger days"  Counseling given: Not Answered   Outpatient Encounter Prescriptions as of 07/28/2016  Medication Sig  . acetaminophen (TYLENOL) 325 MG tablet Take 325-650 mg by mouth 2 (two) times daily as needed for mild pain.   [name] AMINO ACIDS-PROTEIN HYDROLYS PO Take 30 mLs by mouth 2 (two) times daily.  [name] amLODipine (NORVASC) 10 MG tablet TAKE 1 TABLET BY MOUTH DAILY  . atorvastatin (LIPITOR) 10 MG tablet TAKE ONE TABLET BY MOUTH ONCE DAILY  . b complex vitamins tablet Take 1 tablet by mouth daily.   . bacitracin-polymyxin b (POLYSPORIN) ophthalmic  ointment Place 1 application into both eyes 2 (two) times daily. apply to eye every 12 hours while awake  . bisacodyl (DULCOLAX) 10 MG suppository Place 1 suppository (10 mg total) rectally daily as needed for moderate constipation.  . calcium-vitamin D (OSCAL WITH D) 500-200 MG-UNIT per tablet Take 1 tablet by mouth daily with breakfast.   . chlorhexidine (PERIDEX) 0.12 % solution Use as directed 15 mLs in the mouth or throat 2 (two) times daily.  . Chlorphen-PE-Acetaminophen (SINUS CONGESTION/PAIN DAY/NGHT PO) Take 1 capsule by mouth daily as needed (for sinus irritation).   . feeding supplement, GLUCERNA SHAKE, (GLUCERNA SHAKE) LIQD Take 237 mLs by mouth daily.   . fluconazole (DIFLUCAN) 200 MG tablet Take 1 tablet (200 mg total) by mouth daily.  . fluticasone (FLONASE) 50 MCG/ACT nasal spray Place 2 sprays into both nostrils daily.  . furosemide (LASIX) 20 MG tablet TAKE 1 TABLET BY MOUTH DAILY  . gabapentin (NEURONTIN) 300 MG capsule Take 1 by mouth twice daily, if pain not improved after 7 days increase to three times daily, start date 07/13/16  . Insulin Glargine (LANTUS SOLOSTAR) 100 UNIT/ML Solostar Pen Inject 5 Units into the skin daily at 10 pm.  . ipratropium-albuterol (DUONEB) 0.5-2.5 (3) MG/3ML SOLN Take 3 mLs by nebulization every 6 (six) hours as needed (sob). Use 3 times daily x 4 days then every 6 hours as needed.  . latanoprost (XALATAN) 0.005 % ophthalmic solution Place 1 drop into both eyes at bedtime.  . levalbuterol (XOPENEX HFA) 45 MCG/ACT inhaler Inhale 2 puffs into the lungs every 6 (six) hours as needed for wheezing or shortness of breath.  . levothyroxine (SYNTHROID, LEVOTHROID) 137 MCG tablet TAKE 1 TABLET (137 MCG TOTAL) BY MOUTH DAILY BEFORE BREAKFAST.  [name] losartan (COZAAR) 50 MG tablet Take 50 mg by mouth daily.  . Maltodextrin-Xanthan Gum (RESOURCE THICKENUP CLEAR) POWD Take 120 g by mouth as needed (use to thicken liquids.).  [name] Multiple Vitamin (MULTIVITAMIN WITH  MINERALS) TABS tablet Take 1 tablet by mouth daily.  . Nutritional Supplements (FEEDING SUPPLEMENT, OSMOLITE 1.5 CAL,) LIQD Place 1,000 mLs into feeding tube daily.  Bertram Gala Glycol-Propyl Glycol 0.4-0.3 % SOLN Place 1 drop into both eyes 4 (four) times daily.   . polyethylene glycol (MIRALAX / GLYCOLAX) packet Take 17 g by mouth daily as needed for mild constipation.  . RESTASIS 0.05 % ophthalmic emulsion USE 1 DROP INTO BOTH EYES TWICE DAILY  . sertraline (ZOLOFT) 100 MG tablet TAKE ONE TABLET BY MOUTH ONCE DAILY FOR DEPRESSION  . traMADol (ULTRAM) 50 MG tablet Take 50 mg by mouth See admin instructions. Every 4-6 hours as needed for pain  . warfarin (COUMADIN) 2.5 MG tablet TAKE AS DIRECTED BY COUMADIN CLINIC.  [name] Water For Irrigation, Sterile (FREE WATER) SOLN Place 230 mLs into feeding tube 3 (three) times daily.  [name] doxycycline (VIBRAMYCIN) 100 MG capsule TAKE ONE CAPSULE BY MOUTH  TWICE A DAY FOR 10 DAYS   No facility-administered encounter medications on file as of 07/28/2016.      Review of Systems  Constitutional:   No  weight loss, night sweats,  Fevers, chills, + fatigue, or  lassitude.  HEENT:   No headaches,  Difficulty swallowing,  Tooth/dental problems, or  Sore throat,                No sneezing, itching, ear ache, nasal congestion, post nasal drip,   CV:  No chest pain,  Orthopnea, PND, swelling in lower extremities, anasarca, dizziness, palpitations, syncope.   GI  No heartburn, indigestion, abdominal pain, nausea, vomiting, diarrhea, change in bowel habits, loss of appetite, bloody stools.   Resp:    No non-productive cough,  No coughing up of blood.  No change in color of mucus.  No wheezing.  No chest wall deformity   gU: no dysuria, change in color of urine, no urgency or frequency.  No flank pain, no hematuria       Physical Exam  BP 122/64 (BP Location: Left Arm, Cuff Size: Normal)   Pulse 94   Ht 5\' 6"  (1.676 m)   Wt 149 lb 12.8 oz (67.9 kg)   SpO2 99%    BMI 24.18 kg/m   GEN: A/Ox3; pleasant , NAD, elderly    HEENT:  [place]/AT,  EACs-clear, TMs-wnl, NOSE-clear, THROAT-clear, no lesions, no postnasal drip or exudate noted.   NECK:  Supple w/ fair ROM; no JVD; normal carotid impulses w/o bruits; no thyromegaly or nodules palpated; no lymphadenopathy.    RESP  Clear  P & A; w/o, wheezes/ rales/ or rhonchi. no accessory muscle use, no dullness to percussion  CARD:  RRR, no m/r/g, no peripheral edema, pulses intact, no cyanosis or clubbing.  GI:   Soft & nt; nml bowel sounds; no organomegaly or masses detected.   Musco: Warm bil, no deformities or joint swelling noted.   Neuro: alert, no focal deficits noted.    Skin left foot boot.      Rubye Oaks, NP 07/28/2016

## 2016-07-28 NOTE — Patient Instructions (Signed)
Continue on current regimen  Follow up with Dr. Vaughan Browner in 6 months and As needed

## 2016-07-28 NOTE — ED Provider Notes (Signed)
[facility] DEPT Provider Note   CSN: [id number] Arrival date & time: 07/28/16  1605     History   Chief Complaint Chief Complaint  Patient presents with  . Foot Pain  . Leg Pain    HPI Kierra Jezewski is a 83 y.o. female.  Patient is a 83 year old female with multiple chronic diseases including coronary artery disease, peripheral vascular disease, anemia, chronic kidney disease, multiple small bowel obstructions and stomach stenosis status post feeding tube presenting today from vascular surgery office after seeing Dr. Bridgett Larsson.  Patient in the last month has had a worsening wound to the left foot with blackening of her second toe. Patient was seen in the emergency room 10 days ago. At that time she had no palpable pulses in bilateral feet but ongoing pain in the toe. At that time vascular surgery was called and they scheduled her a follow-up appointment. Since that time patient has had ongoing pain but is now having sloughing of the skin on the top of the foot and on the plantar surface of the foot. She denies any fever, appetite changes or weakness.    The history is provided by the patient and a relative.  Foot Pain  This is a chronic problem.  Leg Pain      Past Medical History:  Diagnosis Date  . Anemia    previous blood transfusions  . Arthritis    "all over"  . Asthma   . Bradycardia    requiring previous d/c of BB and reduction of amiodarone  . CAD (coronary artery disease)    nonobstructive per notes  . Chronic diastolic CHF (congestive heart failure) ([place])   . CKD (chronic kidney disease), stage III   . Complication of blood transfusion    "got the wrong blood type at [facility] in ~ 2015; no adverse reaction that we are aware of"/daughter, [name] (01/27/2016)  . COPD (chronic obstructive pulmonary disease) ([place])   . Depression    "light case"  . DVT (deep venous thrombosis) ([place]) 01/2016   a. LLE DVT 01/2016 - switched from Eliquis to Coumadin.  . Gastric  stenosis    a. s/p stomach tube  . GERD (gastroesophageal reflux disease)   . History of blood transfusion    "several" (01/27/2016)  . History of stomach ulcers   . Hyperlipidemia   . Hypertension   . Hypothyroidism   . Paraesophageal hernia   . Perforated gastric ulcer ([place])   . Seasonal allergies   . SIADH (syndrome of inappropriate ADH production) ([place])    [name] 01/10/2015  . Small bowel obstruction ([place])    "I don't know how many" (01/11/2015)  . Stroke ([place])    "light one"  . Type II diabetes mellitus ([place])    "related to prednisone use  for > 20 yr; once predinose stopped; no more DM RX" (01/27/2016)  . Ventral hernia with bowel obstruction     Patient Active Problem List   Diagnosis Date Noted  . Atherosclerosis of native arteries of the extremities with gangrene ([place]) 07/28/2016  . Gangrene ([place]) 07/28/2016  . Calcium channel blocker adverse reaction 06/08/2016  . Elevated TSH   . Aspiration pneumonitis ([place])   . Other specified hypothyroidism   . Acute respiratory failure with hypoxia ([place]) 05/22/2016  . HCAP (healthcare-associated pneumonia)   . Acute on chronic congestive heart failure ([place])   . CHF (NYHA class IV, ACC/AHA stage D) ([place])   . Advance care planning   . Palliative  care by specialist   . Shortness of breath   . Pressure injury of skin 03/11/2016  . PEG (percutaneous endoscopic gastrostomy) status ([place]) 03/10/2016  . Acute on chronic diastolic congestive heart failure ([place]) 03/02/2016  . Encounter for therapeutic drug monitoring 02/16/2016  . Sepsis ([place]) 02/08/2016  . UTI (urinary tract infection) 02/08/2016  . Warfarin anticoagulation   . Chronic seasonal allergic rhinitis   . Sinus pain   . Acute renal insufficiency 01/26/2016  . Acute deep vein thrombosis (DVT) of left lower extremity ([place])   . Acute respiratory failure ([place])   . Respiratory alkalosis   . Atrial flutter ([place]) 01/24/2016  . Chronic diastolic CHF (congestive heart failure)  ([place]) 01/24/2016  . Infection 01/24/2016  . Bilateral leg edema 12/29/2015  . Hypothyroidism due to medication   . Weakness   . Pyloric stenosis   . Pressure ulcer 09/15/2015  . Hyperkalemia 09/12/2015  . Acute osteomyelitis of toe of right foot ([place])   . PVD (peripheral vascular disease) ([place])   . Adjustment disorder with anxiety 08/29/2015  . Diabetic ulcer of toe of right foot associated with type 2 diabetes mellitus, with necrosis of muscle ([place])   . Diverticulitis of large intestine without perforation or abscess without bleeding   . Colitis 08/26/2015  . Acute on chronic diastolic CHF (congestive heart failure), NYHA class 4 ([place]) 08/02/2015  . Esophageal candidiasis ([place])   . Goals of care, counseling/discussion   . Dyspnea   . SOB (shortness of breath)   . Acute renal failure superimposed on stage 4 chronic kidney disease ([place])   . Encounter for long-term (current) use of other high-risk medications   . Respiratory distress 07/06/2015  . Dysphagia 07/05/2015  . Asthma with allergic rhinitis 06/04/2015  . Hypertensive heart disease 03/18/2015  . Malnutrition of moderate degree 01/28/2015  . Syncope 01/27/2015  . Fever, unspecified 01/22/2015  . Symptomatic bradycardia 01/21/2015  . Hypotension due to drugs 01/21/2015  . Palliative care encounter   . DNR (do not resuscitate) discussion   . Protein-calorie malnutrition, severe 01/12/2015  . Elevated troponin 01/12/2015  . Essential hypertension 01/12/2015  . AP (abdominal pain) 01/10/2015  . SBO (small bowel obstruction) ([place]) 01/10/2015  . Cerebrovascular disease 07/14/2014  . Diabetic polyneuropathy associated with type 2 diabetes mellitus ([place]) 07/14/2014  . Hyperlipidemia 07/14/2014  . Gastric outlet obstruction 05/21/2014  . Unstable gait 04/29/2014  . Hyponatremia 03/23/2014  . Acute encephalopathy 03/22/2014  . Slurred speech   . Gastroparesis   . Slow transit constipation 02/03/2014  . Carotid stenosis  02/03/2014  . Demand ischemia ([place]) 01/21/2014  . Gastric bezoar 01/20/2014  . Anemia of chronic disease 01/18/2014  . Chronic kidney insufficiency, stage 3 (moderate) 01/18/2014  . Depression 09/29/2013  . Pneumonia 09/29/2013  . Essential hypertension, benign 09/22/2013  . GERD (gastroesophageal reflux disease) 09/22/2013  . Dyslipidemia 09/22/2013  . Diabetes mellitus type 2 in nonobese ([place]) 09/22/2013  . Persistent atrial fibrillation ([place]) 09/22/2013  . CVA (cerebral vascular accident) ([place]) 09/22/2013  . Diabetic neuropathy ([place]) 09/22/2013  . Hypothyroidism 09/22/2013  . Chronic gastrojejunal ulcer with perforation ([place]) 09/07/2013  . Diabetes ([place]) 09/07/2013  . Accelerated hypertension 09/07/2013  . Systemic lupus erythematosus (SLE) inhibitor 09/07/2013  . Other specified abnormal immunological findings in serum 09/07/2013    Past Surgical History:  Procedure Laterality Date  . CATARACT EXTRACTION W/ INTRAOCULAR LENS  IMPLANT, BILATERAL    . CHOLECYSTECTOMY OPEN    . COLECTOMY    .  ESOPHAGOGASTRODUODENOSCOPY N/A 01/19/2014   Procedure: ESOPHAGOGASTRODUODENOSCOPY (EGD);  Surgeon: Irene Shipper, MD;  Location: [facility];  Service: Endoscopy;  Laterality: N/A;  . ESOPHAGOGASTRODUODENOSCOPY N/A 01/20/2014   Procedure: ESOPHAGOGASTRODUODENOSCOPY (EGD);  Surgeon: Irene Shipper, MD;  Location: [facility];  Service: Endoscopy;  Laterality: N/A;  . ESOPHAGOGASTRODUODENOSCOPY N/A 03/19/2014   Procedure: ESOPHAGOGASTRODUODENOSCOPY (EGD);  Surgeon: Milus Banister, MD;  Location: [facility];  Service: Endoscopy;  Laterality: N/A;  . ESOPHAGOGASTRODUODENOSCOPY N/A 07/08/2015   Procedure: ESOPHAGOGASTRODUODENOSCOPY (EGD);  Surgeon: Doran Stabler, MD;  [facility];  Service: Endoscopy;  Laterality: N/A;  . ESOPHAGOGASTRODUODENOSCOPY (EGD) WITH PROPOFOL N/A 09/15/2015   Procedure: ESOPHAGOGASTRODUODENOSCOPY (EGD) WITH PROPOFOL;  Surgeon: Manus Gunning, MD;   Location: [facility] ENDOSCOPY;  Service: Gastroenterology;  Laterality: N/A;  . GASTROJEJUNOSTOMY     hx/notes 01/10/2015  . GASTROJEJUNOSTOMY N/A 09/23/2015   Procedure: OPEN GASTROJEJUNOSTOMY TUBE PLACEMENT;  Surgeon: Arta Bruce Kinsinger, MD;  Location: [facility] ORS;  Service: General;  Laterality: N/A;  . GLAUCOMA SURGERY Bilateral   . HERNIA REPAIR  2015  . IR GENERIC HISTORICAL  01/07/2016   IR GJ TUBE CHANGE 01/07/2016 Jacqulynn Cadet, MD [facility]  . IR GENERIC HISTORICAL  01/27/2016   IR MECH REMOV OBSTRUC MAT ANY COLON TUBE W/FLUORO 01/27/2016 Markus Daft, MD [facility]  . IR GENERIC HISTORICAL  02/07/2016   IR PATIENT EVAL TECH 0-60 MINS Darrell K Allred, PA-C [facility]  . IR GENERIC HISTORICAL  02/08/2016   IR GJ TUBE CHANGE 02/08/2016 Greggory Keen, MD [facility]  . IR GENERIC HISTORICAL  01/06/2016   IR GJ TUBE CHANGE 01/06/2016 [facility] OUT REF  . IR GENERIC HISTORICAL  05/02/2016   IR CM INJ ANY COLONIC TUBE W/FLUORO 05/02/2016 Arne Cleveland, MD [facility]  . IR GENERIC HISTORICAL  05/15/2016   IR GJ TUBE CHANGE 05/15/2016 Sandi Mariscal, MD [facility]  . IR GENERIC HISTORICAL  06/28/2016   IR GJ TUBE CHANGE 06/28/2016 [facility]  . LAPAROTOMY N/A 01/20/2015   Procedure: EXPLORATORY LAPAROTOMY;  Surgeon: Coralie Keens, MD;  Location: [facility];  Service: General;  Laterality: N/A;  . LYSIS OF ADHESION N/A 01/20/2015   Procedure: LYSIS OF ADHESIONS < 1 HOUR;  Surgeon: Coralie Keens, MD;  Location: [facility];  Service: General;  Laterality: N/A;  . TONSILLECTOMY    . TUBAL LIGATION    . VENTRAL HERNIA REPAIR  2015   incarcerated ventral hernia ([facility] 09/2013)/notes 01/10/2015    OB History    No data available       Home Medications    Prior to Admission medications   Medication Sig Start Date End Date Taking? Authorizing Provider  acetaminophen (TYLENOL) 325 MG tablet Take 325-650 mg by mouth 2 (two) times daily as needed for mild pain.     Historical Provider, MD  AMINO  ACIDS-PROTEIN HYDROLYS PO Take 30 mLs by mouth 2 (two) times daily.    Historical Provider, MD  amLODipine (NORVASC) 10 MG tablet TAKE 1 TABLET BY MOUTH DAILY 07/10/16   Imogene Burn, PA-C  atorvastatin (LIPITOR) 10 MG tablet TAKE ONE TABLET BY MOUTH ONCE DAILY 05/22/16   Gildardo Cranker, DO  b complex vitamins tablet Take 1 tablet by mouth daily.     Historical Provider, MD  bacitracin-polymyxin b (POLYSPORIN) ophthalmic ointment Place 1 application into both eyes 2 (two) times daily. apply to eye every 12 hours while awake    Historical Provider, MD  bisacodyl (DULCOLAX) 10 MG suppository  Place 1 suppository (10 mg total) rectally daily as needed for moderate constipation. 09/30/15   Debbe Odea, MD  calcium-vitamin D (OSCAL WITH D) 500-200 MG-UNIT per tablet Take 1 tablet by mouth daily with breakfast.     Historical Provider, MD  chlorhexidine (PERIDEX) 0.12 % solution Use as directed 15 mLs in the mouth or throat 2 (two) times daily.    Historical Provider, MD  Chlorphen-PE-Acetaminophen (SINUS CONGESTION/PAIN DAY/NGHT PO) Take 1 capsule by mouth daily as needed (for sinus irritation).     Historical Provider, MD  doxycycline (VIBRAMYCIN) 100 MG capsule TAKE ONE CAPSULE BY MOUTH TWICE A DAY FOR 10 DAYS 07/21/16   Historical Provider, MD  feeding supplement, GLUCERNA SHAKE, (GLUCERNA SHAKE) LIQD Take 237 mLs by mouth daily.     Historical Provider, MD  fluconazole (DIFLUCAN) 200 MG tablet Take 1 tablet (200 mg total) by mouth daily. 06/19/16   Julianne Rice, MD  fluticasone (FLONASE) 50 MCG/ACT nasal spray Place 2 sprays into both nostrils daily. 08/07/14   Gildardo Cranker, DO  furosemide (LASIX) 20 MG tablet TAKE 1 TABLET BY MOUTH DAILY 07/14/16   Imogene Burn, PA-C  gabapentin (NEURONTIN) 300 MG capsule 3 (three) times daily. Take 1 by mouth twice daily, if pain not improved after 7 days increase to three times daily, start date 07/13/16     Historical Provider, MD  Insulin Glargine (LANTUS SOLOSTAR)  100 UNIT/ML Solostar Pen Inject 5 Units into the skin daily at 10 pm. 06/02/16   Gildardo Cranker, DO  ipratropium-albuterol (DUONEB) 0.5-2.5 (3) MG/3ML SOLN Take 3 mLs by nebulization every 6 (six) hours as needed (sob). Use 3 times daily x 4 days then every 6 hours as needed. 03/21/16   Eileen Stanford, PA-C  latanoprost (XALATAN) 0.005 % ophthalmic solution Place 1 drop into both eyes at bedtime. 06/02/14   Blanchie Serve, MD  levalbuterol (XOPENEX HFA) 45 MCG/ACT inhaler Inhale 2 puffs into the lungs every 6 (six) hours as needed for wheezing or shortness of breath. 02/03/16   Gildardo Cranker, DO  levothyroxine (SYNTHROID, LEVOTHROID) 137 MCG tablet TAKE 1 TABLET (137 MCG TOTAL) BY MOUTH DAILY BEFORE BREAKFAST. 04/04/16   Gildardo Cranker, DO  losartan (COZAAR) 50 MG tablet Take 50 mg by mouth daily. 06/16/16   Historical Provider, MD  Maltodextrin-Xanthan Gum (RESOURCE THICKENUP CLEAR) POWD Take 120 g by mouth as needed (use to thicken liquids.). 11/11/15   Eugenie Filler, MD  Multiple Vitamin (MULTIVITAMIN WITH MINERALS) TABS tablet Take 1 tablet by mouth daily.    Historical Provider, MD  Nutritional Supplements (FEEDING SUPPLEMENT, OSMOLITE 1.5 CAL,) LIQD Place 1,000 mLs into feeding tube daily. 09/30/15   Debbe Odea, MD  Polyethyl Glycol-Propyl Glycol 0.4-0.3 % SOLN Place 1 drop into both eyes 4 (four) times daily.     Historical Provider, MD  polyethylene glycol (MIRALAX / GLYCOLAX) packet Take 17 g by mouth daily as needed for mild constipation.    Historical Provider, MD  RESTASIS 0.05 % ophthalmic emulsion USE 1 DROP INTO BOTH EYES TWICE DAILY 06/28/16   Estill Dooms, MD  sertraline (ZOLOFT) 100 MG tablet TAKE ONE TABLET BY MOUTH ONCE DAILY FOR DEPRESSION 04/10/16   Gildardo Cranker, DO  traMADol (ULTRAM) 50 MG tablet Take 50 mg by mouth See admin instructions. Every 4-6 hours as needed for pain 05/01/16   Historical Provider, MD  warfarin (COUMADIN) 2.5 MG tablet TAKE AS DIRECTED BY COUMADIN CLINIC.  06/22/16   Dorothy Spark, MD  Water For Irrigation, Sterile (FREE WATER) SOLN Place 230 mLs into feeding tube 3 (three) times daily.    Historical Provider, MD    Family History Family History  Problem Relation Age of Onset  . Stroke Mother   . Hypertension Mother   . Diabetes Brother   . Heart attack Neg Hx     Social History Social History  Substance Use Topics  . Smoking status: Never Smoker  . Smokeless tobacco: Former Systems developer    Types: Snuff     Comment: "used snuff in my younger days"  . Alcohol use No     Allergies   Penicillins   Review of Systems Review of Systems  All other systems reviewed and are negative.    Physical Exam Updated Vital Signs BP (!) 171/78   Pulse 91   Temp 97.6 F (36.4 C) (Oral)   Resp 18   Ht 5\' 6"  (1.676 m)   Wt 144 lb (65.3 kg)   SpO2 100%   BMI 23.24 kg/m   Physical Exam  Constitutional: She is oriented to person, place, and time. She appears well-developed and well-nourished. No distress.  HENT:  Head: Normocephalic and atraumatic.  Mouth/Throat: Oropharynx is clear and moist.  Eyes: Conjunctivae and EOM are normal. Pupils are equal, round, and reactive to light.  Neck: Normal range of motion. Neck supple.  Cardiovascular: Normal rate, regular rhythm and intact distal pulses.   No murmur heard. Pulmonary/Chest: Effort normal and breath sounds normal. No respiratory distress. She has no wheezes. She has no rales.  Abdominal: Soft. She exhibits no distension. There is no tenderness. There is no rebound and no guarding.  Feeding tube present in the LUQ with minimal drainage.  Multiple well-healed surgical scars with multiple soft reducible hernias  Musculoskeletal: Normal range of motion. She exhibits edema and tenderness.  Sloughing of the skin over the distal dorsal portion of the foot from toes 2-4.  2nd toe is black and decreased sensation.  Plantar surface of the foot is weeping and skin is peeling.  Foot is warm with 2  sec cap refill.  No palpable pulse.  Mild pitting edema noted to the LLE.  No erythema or streaking up the leg.  Neurological: She is alert and oriented to person, place, and time.  Skin: Skin is warm and dry. No rash noted. No erythema.  Psychiatric: She has a normal mood and affect. Her behavior is normal.  Nursing note and vitals reviewed.    ED Treatments / Results  Labs (all labs ordered are listed, but only abnormal results are displayed) Labs Reviewed  CBC WITH DIFFERENTIAL/PLATELET - Abnormal; Notable for the following:       Result Value   RBC 3.04 (*)    Hemoglobin 8.9 (*)    HCT 26.0 (*)    RDW 18.0 (*)    Platelets 530 (*)    All other components within normal limits  I-STAT CHEM 8, ED - Abnormal; Notable for the following:    BUN 43 (*)    Creatinine, Ser 1.30 (*)    Glucose, Bld 101 (*)    Calcium, Ion 1.14 (*)    Hemoglobin 9.5 (*)    HCT 28.0 (*)    All other components within normal limits  CULTURE, BLOOD (ROUTINE X 2)  CULTURE, BLOOD (ROUTINE X 2)  PROTIME-INR  SEDIMENTATION RATE  C-REACTIVE PROTEIN    EKG  EKG Interpretation None       Radiology Dg Foot Complete  Left  Result Date: 07/28/2016 CLINICAL DATA:  83 y/o F; increasing foot pain in the plantar and anterior site of the foot. EXAM: LEFT FOOT - COMPLETE 3+ VIEW COMPARISON:  08/26/2014 foot radiographs FINDINGS: Bones are demineralized. Hallux valgus. Lisfranc alignment is maintained. Pes planus. No acute fracture identified. Plantar calcaneal enthesophyte. Vascular calcifications. IMPRESSION: No acute fracture identified. Hallux valgus. Pes planus. Plantar calcaneal enthesophyte. Electronically Signed   By: Kristine Garbe M.D.   On: 07/28/2016 21:10    Procedures Procedures (including critical care time)  Medications Ordered in ED Medications  vancomycin (VANCOCIN) 1,250 mg in sodium chloride 0.9 % 250 mL IVPB (not administered)     Initial Impression / Assessment and Plan /  ED Course  I have reviewed the triage vital signs and the nursing notes.  Pertinent labs & imaging results that were available during my care of the patient were reviewed by me and considered in my medical decision making (see chart for details).    Patient presenting with persistently worsening discoloration of the second toe and now sloughing of the skin. In the office patient was found to have decreased flow to the left foot with concern for ischemic toe. After Dr. Bridgett Larsson evaluated the patient he sent her to the emergency room for admission, IV antibiotics and evaluation to ensure no osteomyelitis.  There were several possible options for treatment of this ischemia however patient will need to be medically optimized before those would be possible. At this time patient is stable. Labs show chronic renal insufficiency and chronic anemia which is not significantly changed. Normal white cell count. X-ray did not show acute ostia. Patient will be admitted for IV antibiotic she was given vancomycin  Final Clinical Impressions(s) / ED Diagnoses   Final diagnoses:  Ischemic toe  Cellulitis of foot    New Prescriptions New Prescriptions   No medications on file     Blanchie Dessert, MD 07/28/16 2235

## 2016-07-28 NOTE — ED Triage Notes (Signed)
Pt reports having pain to left foot and leg, hx of same. Pt has boot on her foot pta. Pt was sent here from vascular md today for further eval. No acute distress is noted at triage.

## 2016-07-28 NOTE — ED Notes (Signed)
Iconnect message sent re: wait time

## 2016-07-28 NOTE — Progress Notes (Addendum)
Pharmacy Antibiotic Note Shannon [name] is a 83 y.o. female admitted on 07/28/2016 with LLE gangrene with critical limb ischemia. Pharmacy has been consulted for vancomycin dosing.  Plan: 1. Vancomycin 1250 IV every 24 hours.  Goal trough 15-20 mcg/mL.  2. Follow up on need for gram negative coverage   Height: 5\' 6"  (167.6 cm) Weight: 144 lb (65.3 kg) IBW/kg (Calculated) : 59.3  Temp (24hrs), Avg:97.2 F (36.2 C), Min:96.7 F (35.9 C), Max:97.6 F (36.4 C)   Recent Labs Lab 07/28/16 2032 07/28/16 2039  WBC 9.1  --   CREATININE  --  1.30*    Estimated Creatinine Clearance: 26.4 mL/min (A) (by C-G formula based on SCr of 1.3 mg/dL (H)).    Allergies  Allergen Reactions  . Penicillins Itching, Rash and Other (See Comments)    Tolerated unasyn, zosyn & cephalosporins in the past.  Has patient had a PCN reaction causing immediate rash, facial/tongue/throat swelling, SOB or lightheadedness with hypotension: Yes Has patient had a PCN reaction causing severe rash involving mucus membranes or skin necrosis: Unk Has patient had a PCN reaction that required hospitalization: Unk Has patient had a PCN reaction occurring within the last 10 years: Unk If all of the above answers are "NO", then may proceed with Cephalosporins    Antimicrobials this admission: 4/27 vancomycin >>    Thank you for allowing pharmacy to be a part of this patient's care.  Vincenza Hews, PharmD, BCPS 07/28/2016, 10:09 PM

## 2016-07-28 NOTE — ED Notes (Signed)
Pt & family updated on wait time & encouraged to wait for physician to eval the pt, pt agrees to wait for evaluation at this time

## 2016-07-28 NOTE — Progress Notes (Signed)
Referred by:  Kirt Boys, DO [address] [place], [place] [address]   Reason for referral: left foot gangrene    History of Present Illness   Shannon [name] is a 83 y.o. (1923/07/19) female with multiple active co-morbities who presents with cc: "bad looking toes"  Pt is somewhat a limited historian due to possible cognitive limitation related to age.  She has previously been seen by Dr. Arbie Cookey in consultation in 08/28/15 with a gangrenous R 3rd toe.  She now present swelling and black toe on L foot.  Reportedly there has been some drainage from the left foot and skin slough.  The patient denies any fever or chills.  Past Medical History:  Diagnosis Date  . Anemia    previous blood transfusions  . Arthritis    "all over"  . Asthma   . Bradycardia    requiring previous d/c of BB and reduction of amiodarone  . CAD (coronary artery disease)    nonobstructive per notes  . Chronic diastolic CHF (congestive heart failure) (HCC)   . CKD (chronic kidney disease), stage III   . Complication of blood transfusion    "got the wrong blood type at [facility] in ~ 2015; no adverse reaction that we are aware of"/daughter, [name] (01/27/2016)  . COPD (chronic obstructive pulmonary disease) (HCC)   . Depression    "light case"  . DVT (deep venous thrombosis) (HCC) 01/2016   a. LLE DVT 01/2016 - switched from Eliquis to Coumadin.  . Gastric stenosis    a. s/p stomach tube  . GERD (gastroesophageal reflux disease)   . History of blood transfusion    "several" (01/27/2016)  . History of stomach ulcers   . Hyperlipidemia   . Hypertension   . Hypothyroidism   . Paraesophageal hernia   . Perforated gastric ulcer (HCC)   . Seasonal allergies   . SIADH (syndrome of inappropriate ADH production) (HCC)    [name] 01/10/2015  . Small bowel obstruction (HCC)    "I don't know how many" (01/11/2015)  . Stroke (HCC)    "light one"  . Type II diabetes mellitus (HCC)    "related to prednisone use   for > 20 yr; once predinose stopped; no more DM RX" (01/27/2016)  . Ventral hernia with bowel obstruction     Past Surgical History:  Procedure Laterality Date  . CATARACT EXTRACTION W/ INTRAOCULAR LENS  IMPLANT, BILATERAL    . CHOLECYSTECTOMY OPEN    . COLECTOMY    . ESOPHAGOGASTRODUODENOSCOPY N/A 01/19/2014   Procedure: ESOPHAGOGASTRODUODENOSCOPY (EGD);  Surgeon: Hilarie Fredrickson, MD;  Location: [facility];  Service: Endoscopy;  Laterality: N/A;  . ESOPHAGOGASTRODUODENOSCOPY N/A 01/20/2014   Procedure: ESOPHAGOGASTRODUODENOSCOPY (EGD);  Surgeon: Hilarie Fredrickson, MD;  Location: [facility];  Service: Endoscopy;  Laterality: N/A;  . ESOPHAGOGASTRODUODENOSCOPY N/A 03/19/2014   Procedure: ESOPHAGOGASTRODUODENOSCOPY (EGD);  Surgeon: Rachael Fee, MD;  Location: [facility];  Service: Endoscopy;  Laterality: N/A;  . ESOPHAGOGASTRODUODENOSCOPY N/A 07/08/2015   Procedure: ESOPHAGOGASTRODUODENOSCOPY (EGD);  Surgeon: Sherrilyn Rist, MD;  Location: [facility] ENDOSCOPY;  Service: Endoscopy;  Laterality: N/A;  . ESOPHAGOGASTRODUODENOSCOPY (EGD) WITH PROPOFOL N/A 09/15/2015   Procedure: ESOPHAGOGASTRODUODENOSCOPY (EGD) WITH PROPOFOL;  Surgeon: Ruffin Frederick, MD;  Location: [facility] ENDOSCOPY;  Service: Gastroenterology;  Laterality: N/A;  . GASTROJEJUNOSTOMY     hx/notes 01/10/2015  . GASTROJEJUNOSTOMY N/A 09/23/2015   Procedure: OPEN GASTROJEJUNOSTOMY TUBE PLACEMENT;  Surgeon: De Blanch Kinsinger, MD;  Location: [facility] ORS;  Service:  General;  Laterality: N/A;  . GLAUCOMA SURGERY Bilateral   . HERNIA REPAIR  2015  . IR GENERIC HISTORICAL  01/07/2016   IR GJ TUBE CHANGE 01/07/2016 Malachy Moan, MD [facility]  . IR GENERIC HISTORICAL  01/27/2016   IR MECH REMOV OBSTRUC MAT ANY COLON TUBE W/FLUORO 01/27/2016 Richarda Overlie, MD [facility]  . IR GENERIC HISTORICAL  02/07/2016   IR PATIENT EVAL TECH 0-60 MINS Darrell K Allred, PA-C [facility]  . IR GENERIC HISTORICAL  02/08/2016   IR GJ TUBE CHANGE  02/08/2016 Berdine Dance, MD [facility]  . IR GENERIC HISTORICAL  01/06/2016   IR GJ TUBE CHANGE 01/06/2016 [facility] OUT REF  . IR GENERIC HISTORICAL  05/02/2016   IR CM INJ ANY COLONIC TUBE W/FLUORO 05/02/2016 Oley Balm, MD [facility]  . IR GENERIC HISTORICAL  05/15/2016   IR GJ TUBE CHANGE 05/15/2016 Simonne Come, MD [facility]  . IR GENERIC HISTORICAL  06/28/2016   IR GJ TUBE CHANGE 06/28/2016 [facility]  . LAPAROTOMY N/A 01/20/2015   Procedure: EXPLORATORY LAPAROTOMY;  Surgeon: Abigail Miyamoto, MD;  Location: [facility] OR;  Service: General;  Laterality: N/A;  . LYSIS OF ADHESION N/A 01/20/2015   Procedure: LYSIS OF ADHESIONS < 1 HOUR;  Surgeon: Abigail Miyamoto, MD;  Location: [facility] OR;  Service: General;  Laterality: N/A;  . TONSILLECTOMY    . TUBAL LIGATION    . VENTRAL HERNIA REPAIR  2015   incarcerated ventral hernia ([facility] 09/2013)/notes 01/10/2015     Social History   Social History  . Marital status: Widowed    Spouse name: N/A  . Number of children: 3  . Years of education: N/A   Occupational History  . Not on file.   Social History Main Topics  . Smoking status: Never Smoker  . Smokeless tobacco: Former Neurosurgeon    Types: Snuff     Comment: "used snuff in my younger days"  . Alcohol use No  . Drug use: No  . Sexual activity: No   Other Topics Concern  . Not on file   Social History Narrative   Married in [place], lives in one story house with 2 people and no pets   Occupation: ?   Has a living Will, [place], and doesn't have DNR   Was living with Husband (in frail health) in [place] until 09/2013.  After her [facility] admission they both came to live with daughter in [place]     Family History  Problem Relation Age of Onset  . Stroke Mother   . Hypertension Mother   . Diabetes Brother   . Heart attack Neg Hx     Current Outpatient Prescriptions  Medication Sig Dispense Refill  . acetaminophen (TYLENOL) 325 MG tablet Take 325-650 mg by mouth 2 (two) times daily  as needed for mild pain.     [name] AMINO ACIDS-PROTEIN HYDROLYS PO Take 30 mLs by mouth 2 (two) times daily.    [name] amLODipine (NORVASC) 10 MG tablet TAKE 1 TABLET BY MOUTH DAILY 30 tablet 0  . atorvastatin (LIPITOR) 10 MG tablet TAKE ONE TABLET BY MOUTH ONCE DAILY 90 tablet 1  . b complex vitamins tablet Take 1 tablet by mouth daily.     . bacitracin-polymyxin b (POLYSPORIN) ophthalmic ointment Place 1 application into both eyes 2 (two) times daily. apply to eye every 12 hours while awake    . bisacodyl (DULCOLAX) 10 MG suppository Place 1 suppository (10 mg total) rectally daily as needed  for moderate constipation. 12 suppository 0  . calcium-vitamin D (OSCAL WITH D) 500-200 MG-UNIT per tablet Take 1 tablet by mouth daily with breakfast.     . chlorhexidine (PERIDEX) 0.12 % solution Use as directed 15 mLs in the mouth or throat 2 (two) times daily.    . Chlorphen-PE-Acetaminophen (SINUS CONGESTION/PAIN DAY/NGHT PO) Take 1 capsule by mouth daily as needed (for sinus irritation).     [name] doxycycline (VIBRAMYCIN) 100 MG capsule TAKE ONE CAPSULE BY MOUTH TWICE A DAY FOR 10 DAYS  0  . feeding supplement, GLUCERNA SHAKE, (GLUCERNA SHAKE) LIQD Take 237 mLs by mouth daily.     . fluconazole (DIFLUCAN) 200 MG tablet Take 1 tablet (200 mg total) by mouth daily. 13 tablet 0  . fluticasone (FLONASE) 50 MCG/ACT nasal spray Place 2 sprays into both nostrils daily. 16 g 3  . furosemide (LASIX) 20 MG tablet TAKE 1 TABLET BY MOUTH DAILY 30 tablet 11  . gabapentin (NEURONTIN) 300 MG capsule 3 (three) times daily. Take 1 by mouth twice daily, if pain not improved after 7 days increase to three times daily, start date 07/13/16     . Insulin Glargine (LANTUS SOLOSTAR) 100 UNIT/ML Solostar Pen Inject 5 Units into the skin daily at 10 pm. 5 pen 6  . ipratropium-albuterol (DUONEB) 0.5-2.5 (3) MG/3ML SOLN Take 3 mLs by nebulization every 6 (six) hours as needed (sob). Use 3 times daily x 4 days then every 6 hours as needed. 360 mL  4  . latanoprost (XALATAN) 0.005 % ophthalmic solution Place 1 drop into both eyes at bedtime. 2.5 mL 12  . levalbuterol (XOPENEX HFA) 45 MCG/ACT inhaler Inhale 2 puffs into the lungs every 6 (six) hours as needed for wheezing or shortness of breath. 1 Inhaler 12  . levothyroxine (SYNTHROID, LEVOTHROID) 137 MCG tablet TAKE 1 TABLET (137 MCG TOTAL) BY MOUTH DAILY BEFORE BREAKFAST. 30 tablet 2  . losartan (COZAAR) 50 MG tablet Take 50 mg by mouth daily.  2  . Maltodextrin-Xanthan Gum (RESOURCE THICKENUP CLEAR) POWD Take 120 g by mouth as needed (use to thicken liquids.). 20 Can 0  . Multiple Vitamin (MULTIVITAMIN WITH MINERALS) TABS tablet Take 1 tablet by mouth daily.    . Nutritional Supplements (FEEDING SUPPLEMENT, OSMOLITE 1.5 CAL,) LIQD Place 1,000 mLs into feeding tube daily. 1000 mL 3  . Polyethyl Glycol-Propyl Glycol 0.4-0.3 % SOLN Place 1 drop into both eyes 4 (four) times daily.     . polyethylene glycol (MIRALAX / GLYCOLAX) packet Take 17 g by mouth daily as needed for mild constipation.    . RESTASIS 0.05 % ophthalmic emulsion USE 1 DROP INTO BOTH EYES TWICE DAILY 60 mL 3  . sertraline (ZOLOFT) 100 MG tablet TAKE ONE TABLET BY MOUTH ONCE DAILY FOR DEPRESSION 90 tablet 1  . traMADol (ULTRAM) 50 MG tablet Take 50 mg by mouth See admin instructions. Every 4-6 hours as needed for pain  0  . warfarin (COUMADIN) 2.5 MG tablet TAKE AS DIRECTED BY COUMADIN CLINIC. 40 tablet 1  . Water For Irrigation, Sterile (FREE WATER) SOLN Place 230 mLs into feeding tube 3 (three) times daily.     No current facility-administered medications for this visit.     Allergies  Allergen Reactions  . Penicillins Itching, Rash and Other (See Comments)    Tolerated unasyn, zosyn & cephalosporins in the past.  Has patient had a PCN reaction causing immediate rash, facial/tongue/throat swelling, SOB or lightheadedness with hypotension: Yes Has patient had  a PCN reaction causing severe rash involving mucus membranes  or skin necrosis: Unk Has patient had a PCN reaction that required hospitalization: Unk Has patient had a PCN reaction occurring within the last 10 years: Unk If all of the above answers are "NO", then may proceed with Cephalosporins     REVIEW OF SYSTEMS:   Cardiac:  positive for: Shortness of breath upon exertion, negative for: Chest pain or chest pressure and Shortness of breath when lying flat,   Vascular:  positive for: Pain in calf, thigh, or hip brought on by ambulation, Left foot swelling negative for: Pain in feet at night that wakes you up from your sleep and Blood clot in your veins  Pulmonary:  positive for: no symptoms,  negative for: Oxygen at home, Productive cough and Wheezing  Neurologic:  positive for: No symptoms, negative for: Sudden weakness in arms or legs, Sudden numbness in arms or legs, Sudden onset of difficulty speaking or slurred speech, Temporary loss of vision in one eye and Problems with dizziness  Gastrointestinal:  positive for: no symptoms, negative for: Blood in stool and Vomited blood  Genitourinary:  positive for: no symptoms, negative for: Burning when urinating and Blood in urine  Psychiatric:  positive for: no symptoms,  negative for: Major depression  Hematologic:  positive for: no symptoms,  negative for: negative for: Bleeding problems and Problems with blood clotting too easily  Dermatologic:  positive for: abnormal skin lesions, negative for: Rashes or ulcers and abnormal skin lesions  Constitutional:  positive for: no symptoms, negative for: Fever or chills  Ear/Nose/Throat:  positive for: no symptoms, negative for: Change in hearing, Nose bleeds and Sore throat  Musculoskeletal:  positive for: no symptoms, negative for: Back pain, Joint pain and Muscle pain   For VQI Use Only   PRE-ADM LIVING Home  AMB STATUS Wheelchair  CAD Sx History of MI, but no symptoms No MI within 6 months  PRIOR CHF Moderate  STRESS  TEST No    Physical Examination   Vitals:   07/28/16 1441  BP: (!) 166/77  Pulse: 92  Resp: 18  Temp: (!) 96.7 F (35.9 C)  TempSrc: Oral  SpO2: 100%  Weight: 150 lb (68 kg)  Height: 5\' 6"  (1.676 m)    Body mass index is 24.21 kg/m.  General Awake, mild confusion, NAD, cachectic  Head [place]/AT,    Ear/Nose/Throat Hearing grossly decreased, nares without erythema or drainage, oropharynx without Erythema or Exudate, Mallampati score: 3,   Eyes PERRL, EOMI,    Neck Supple, mid-line trachea,    Pulmonary Sym exp, good B air movt, CTA B  Cardiac RRR, Nl S1, S2, no Murmurs, No rubs, No S3,S4  Vascular Vessel Right Left  Radial Faintly palpable  Faintly palpable  Brachial Palpable Palpable  Carotid Palpable, No Bruit Palpable, No Bruit  Aorta Not palpable N/A  Femoral Palpable Palpable  Popliteal Not palpable Not palpable  PT Not palpable Not palpable  DP Not palpable Not palpable    Gastrointestinal soft, non-distended, mild diffuse TTP, No guarding or rebound, no HSM, no masses, no CVAT B, No palpable prominent aortic pulse, Surgical incisions well healed, G tube in place  Musculoskeletal M/S limited, BUE appeared to move 4-5/5, RLE 3-4/5, LLE limited due to pain,  R foot without ischemic changes, L foot with black toe with superficial skin slough around toes 2-4, swelling and TTP into mid-foot, no frank erythema or ascending streaking  Neurologic Limited neuro exam, Pain and  light touch intact in extremities, Motor exam as listed above  Psychiatric Judgement not intact, Mood & affect appropriate for pt's clinical situation  Dermatologic See M/S exam for extremity exam,   Lymphatic  Palpable lymph nodes:     Non-Invasive Vascular Imaging   B ABI (07/28/2016 )  R:   PT: none  DP: damp bi  L:   PT: damp mono  DP: damp mono   LLE Arterial Duplex (07/28/2016)  CFA: bi  PFA: bi  SFA: mono-bi, occluded distally  Reconstituted pop: mono  AT/PT:  mono  Laboratory: CBC:    Component Value Date/Time   WBC 14.7 (H) 07/18/2016 1627   RBC 3.18 (L) 07/18/2016 1627   HGB 9.4 (L) 07/18/2016 1627   HCT 27.3 (L) 07/18/2016 1627   HCT 32.0 (L) 06/27/2016 1054   PLT 316 07/18/2016 1627   PLT 368 06/27/2016 1054   MCV 85.8 07/18/2016 1627   MCV 89 06/27/2016 1054   MCH 29.6 07/18/2016 1627   MCHC 34.4 07/18/2016 1627   RDW 19.1 (H) 07/18/2016 1627   RDW 21.1 (H) 06/27/2016 1054   LYMPHSABS 2.1 07/18/2016 1627   LYMPHSABS 1.2 10/06/2015 1151   MONOABS 1.3 (H) 07/18/2016 1627   EOSABS 0.3 07/18/2016 1627   EOSABS 2.4 (H) 10/06/2015 1151   BASOSABS 0.0 07/18/2016 1627   BASOSABS 0.1 10/06/2015 1151    BMP:    Component Value Date/Time   NA 134 (L) 07/18/2016 1627   NA 133 (A) 06/30/2016   K 4.6 07/18/2016 1627   CL 96 (L) 07/18/2016 1627   CO2 26 07/18/2016 1627   GLUCOSE 111 (H) 07/18/2016 1627   BUN 36 (H) 07/18/2016 1627   BUN 59 (A) 06/30/2016   CREATININE 1.31 (H) 07/18/2016 1627   CREATININE 1.36 (H) 06/28/2016 1636   CALCIUM 9.7 07/18/2016 1627   GFRNONAA 34 (L) 07/18/2016 1627   GFRNONAA 31 (L) 02/17/2016 1435   GFRAA 40 (L) 07/18/2016 1627   GFRAA 36 (L) 02/17/2016 1435    Coagulation: Lab Results  Component Value Date   INR 2.7 07/26/2016   INR 2.29 07/18/2016   INR 3.9 07/11/2016   No results found for: PTT  Lipids:    Component Value Date/Time   CHOL 126 04/07/2015 1015   TRIG 107 04/07/2015 1015   HDL 69 04/07/2015 1015   CHOLHDL 1.8 04/07/2015 1015   CHOLHDL 1.9 12/24/2013 0456   VLDL 26 12/24/2013 0456   LDLCALC 36 04/07/2015 1015    Radiology   No results found.   Medical Decision Making   Epiphany Bockrath is a 83 y.o. female who presents with: multiple co-morbidities including CHF, chronic kidney disease stage IV, L foot gangrene, LLE critical limb ischemia    This patient's left foot requires further evaluation including L foot X-rays if not MRI to rule out osteomyelitis and mid-foot  abscess.  Her daughter is going to take her to [facility].  I have discussed her case with [facility] ED Triage.  She will likely need Hospitalists admission for IV abx and medical optimization.  In regards to her L foot gangrene, her options include: 1. Palliative management: pain medications, wound care, and abx 2. Left BKA vs AKA: not certain how she would risk stratify from a cardiac viewpoint for such. 3. L leg angiogram, possible intervention followed by L toe amp vs TMA: obviously she would be at risk for developing ESRD due to CIN  I discussed in depth with the patient the nature  of atherosclerosis, and emphasized the importance of maximal medical management including strict control of blood pressure, blood glucose, and lipid levels, obtaining regular exercise, antiplatelet agents, and cessation of smoking.   The patient is currently on a statin: Lipitor.  The patient is currently not on an anti-platelet due to bleeding risks related to use of an anticoagulant.  The patient is on Coumadin also.  The patient is aware that without maximal medical management the underlying atherosclerotic disease process will progress, limiting the benefit of any interventions.  Thank you for allowing [place] to participate in this patient's care.   Leonides Sake, MD, FACS [facility] Office: [phone] Pager: [phone]  07/28/2016, 4:46 PM

## 2016-07-29 ENCOUNTER — Other Ambulatory Visit: Payer: Self-pay | Admitting: Internal Medicine

## 2016-07-29 DIAGNOSIS — L03119 Cellulitis of unspecified part of limb: Secondary | ICD-10-CM

## 2016-07-29 LAB — CBC
HCT: 23.4 % — ABNORMAL LOW (ref 36.0–46.0)
Hemoglobin: 7.9 g/dL — ABNORMAL LOW (ref 12.0–15.0)
MCH: 29 pg (ref 26.0–34.0)
MCHC: 33.8 g/dL (ref 30.0–36.0)
MCV: 86 fL (ref 78.0–100.0)
Platelets: 591 10*3/uL — ABNORMAL HIGH (ref 150–400)
RBC: 2.72 MIL/uL — ABNORMAL LOW (ref 3.87–5.11)
RDW: 17.6 % — ABNORMAL HIGH (ref 11.5–15.5)
WBC: 11.5 10*3/uL — ABNORMAL HIGH (ref 4.0–10.5)

## 2016-07-29 LAB — GLUCOSE, CAPILLARY
Glucose-Capillary: 116 mg/dL — ABNORMAL HIGH (ref 65–99)
Glucose-Capillary: 130 mg/dL — ABNORMAL HIGH (ref 65–99)
Glucose-Capillary: 143 mg/dL — ABNORMAL HIGH (ref 65–99)
Glucose-Capillary: 168 mg/dL — ABNORMAL HIGH (ref 65–99)
Glucose-Capillary: 177 mg/dL — ABNORMAL HIGH (ref 65–99)
Glucose-Capillary: 96 mg/dL (ref 65–99)

## 2016-07-29 LAB — BASIC METABOLIC PANEL
Anion gap: 10 (ref 5–15)
BUN: 42 mg/dL — ABNORMAL HIGH (ref 6–20)
CO2: 23 mmol/L (ref 22–32)
Calcium: 9.8 mg/dL (ref 8.9–10.3)
Chloride: 102 mmol/L (ref 101–111)
Creatinine, Ser: 1.15 mg/dL — ABNORMAL HIGH (ref 0.44–1.00)
GFR calc Af Amer: 47 mL/min — ABNORMAL LOW (ref >60)
GFR calc non Af Amer: 40 mL/min — ABNORMAL LOW (ref >60)
Glucose, Bld: 89 mg/dL (ref 65–99)
Potassium: 4.5 mmol/L (ref 3.5–5.1)
Sodium: 135 mmol/L (ref 135–145)

## 2016-07-29 LAB — PROTIME-INR
INR: 2.49
Prothrombin Time: 27.4 seconds — ABNORMAL HIGH (ref 11.4–15.2)

## 2016-07-29 LAB — PREALBUMIN: Prealbumin: 16.8 mg/dL — ABNORMAL LOW (ref 18–38)

## 2016-07-29 LAB — SEDIMENTATION RATE: Sed Rate: >140 mm/hr — ABNORMAL HIGH (ref 0–22)

## 2016-07-29 LAB — MRSA PCR SCREENING: MRSA by PCR: NEGATIVE

## 2016-07-29 MED ORDER — VANCOMYCIN HCL IN DEXTROSE 1-5 GM/200ML-% IV SOLN
1000.0000 mg | INTRAVENOUS | Status: DC
  Administered 2016-07-29 – 2016-07-31 (×2): 1000 mg via INTRAVENOUS
  Filled 2016-07-29 (×2): qty 200

## 2016-07-29 MED ORDER — NEPRO/CARBSTEADY PO LIQD
237.0000 mL | Freq: Two times a day (BID) | ORAL | Status: DC
  Administered 2016-07-29 – 2016-08-01 (×6): 237 mL via ORAL

## 2016-07-29 MED ORDER — PRO-STAT SUGAR FREE PO LIQD
30.0000 mL | Freq: Every day | ORAL | Status: DC
  Administered 2016-07-29 – 2016-08-01 (×4): 30 mL
  Filled 2016-07-29 (×4): qty 30

## 2016-07-29 MED ORDER — LEVOTHYROXINE SODIUM 25 MCG PO TABS
137.0000 ug | ORAL_TABLET | Freq: Every day | ORAL | Status: DC
  Administered 2016-07-29 – 2016-08-01 (×4): 137 ug via ORAL
  Filled 2016-07-29 (×4): qty 1

## 2016-07-29 MED ORDER — OSMOLITE 1.5 CAL PO LIQD
1000.0000 mL | ORAL | Status: DC
  Administered 2016-07-29 – 2016-08-01 (×3): 1000 mL
  Filled 2016-07-29 (×4): qty 1000

## 2016-07-29 NOTE — Progress Notes (Signed)
Initial Nutrition Assessment  DOCUMENTATION CODES:   Not applicable  INTERVENTION:  Provide Osmolite 1.5 @ 65 ml/hr via J-tube for 12 hours over night (2000-0800). Provides 1170 kcal, 49 grams of protein, 593 ml water daily.  Provide Pro-Stat 30 ml via J-tube daily, each supplement provides 100 kcal and 15 grams of protein.  Continue free water flushes of 230 ml TID per tube (patient's home regimen).  Provide Nepro Shake po BID, each supplement provides 425 kcal and 19 grams protein, 173 ml water.  Continue daily multivitamin with minerals.  NUTRITION DIAGNOSIS:   Inadequate oral intake related to altered GI function (hx of SBO, GOO requiring GJ tube) as evidenced by other (see comment) (full liquid nectar-thick diet).  GOAL:   Patient will meet greater than or equal to 90% of their needs  MONITOR:   PO intake, Supplement acceptance, Labs, Weight trends, TF tolerance, I & O's  REASON FOR ASSESSMENT:   Consult, Malnutrition Screening Tool Enteral/tube feeding initiation and management (and wound healing)  ASSESSMENT:   83 year old female with PMHx of HTN, SBO, GOO s/p GJ tube placement initially 09/23/2015, HLD, COPD, GERD, history of stomach ulcers, SIADH, paraesophageal hernia, DVT, DM type 2, CKD III, prior CVA, chronic diastolic CHF, CAD referred to ED from vascular surgery clinic for evaluation of progressive diabetic food ulcer and gangrene of second digit on let toe.   -Per chart most recent GJ exchange was 06/28/2016 - tip terminates in proximal jejunum. -Per Vascular Surgery options are palliative care, arteriography, or AKA.  Spoke with patient at bedside. She reports she lives at home. She continues to tolerate her goal TF regimen of Osmolite 1.5 @ 65 ml/hr over 12 hours (8p-8a) via GJ tube. She did not receive her TF last night since she got here late, but received it the night before. She continues to do free water flush of 230 ml TID. She drinks Nepro 1-2 times  daily at home and enjoys all of the flavors. She usually eats lunch and dinner at home. She is able to thicken liquids herself. She enjoys cream of chicken soup, gelatin, pudding. Patient reports she has a problem with hyperkalemia so she avoids orange juice. She will drink thickened apple juice and tea.  Patient reports her UBW used to be 160 lbs and she has lost a lot of weight. Last time per chart patient was 160 lbs was in 2015. Patient was 133-137 lbs earlier in 2018 and has since been gaining weight.   Medications reviewed and include: Lasix 20 mg daily, Novolog sliding scale TID with meals, Lantus 5 units daily at 10pm, levothyroxine, MVI daily.  Labs reviewed: CBG 96-116, BUN 52, Creatinine 1.15.   Nutrition-Focused physical exam completed. Findings are mild-moderate fat depletion, moderate-severe muscle depletion, and moderate edema left lower extremity. Patient reports she can ambulate with a walker. Some level of muscle wasting expected due to advanced age and limited mobility.  Discussed with RN. As patient knows how to thicken her liquids to appropriate consistency, okay to add note in [facility] to send thin liquids to be thickened in room.   Diet Order:  Diet full liquid Room service appropriate? Yes; Fluid consistency: Nectar Thick  Skin:  Wound (see comment) (diabetic ulcers left foot, left 2nd toe necrotic)  Last BM:  07/28/2016  Height:   Ht Readings from Last 1 Encounters:  07/28/16 5\' 6"  (1.676 m)    Weight:   Wt Readings from Last 1 Encounters:  07/29/16 142 lb  14.4 oz (64.8 kg)    Ideal Body Weight:  59.1 kg  BMI:  Body mass index is 23.06 kg/m.  Estimated Nutritional Needs:   Kcal:  1412-1630 (MSJ x 1.3-1.5)  Protein:  75-90 grams (1.2-1.4 grams/kg)  Fluid:  1.5 L/day  EDUCATION NEEDS:   No education needs identified at this time  Willey Blade, MS, RD, LDN Pager: [phone] After Hours Pager: [phone]

## 2016-07-29 NOTE — Progress Notes (Signed)
   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 Bridgett Larsson. As outlined in his note, there are 3 options: 1. Palliative care 2. Arteriography. The patient's family had previously refused this given her chronic kidney disease and the risk for putting her on dialysis. The study could potentially be done with CO2 although these images are not as useful. 3. Primary above-the-knee amputation which would be associated with significant risk given her age  Coumadin on hold for now. INR today is 2.5   SUBJECTIVE:   Not having significant pain.  PHYSICAL EXAM:   Vitals:   07/28/16 2230 07/28/16 2300 07/29/16 0551 07/29/16 0851  BP: (!) 165/69 (!) 153/62 (!) 150/62 (!) 154/67  Pulse: 89 80 79 82  Resp: 18 18 18 16   Temp:   98.4 F (36.9 C) 98.4 F (36.9 C)  TempSrc:   Oral Oral  SpO2: 100% 98% 92% 96%  Weight:   142 lb 14.4 oz (64.8 kg)   Height:       Gangrene of left foot is unchanged.  LABS:   Lab Results  Component Value Date   WBC 11.5 (H) 07/29/2016   HGB 7.9 (L) 07/29/2016   HCT 23.4 (L) 07/29/2016   MCV 86.0 07/29/2016   PLT 591 (H) 07/29/2016   Lab Results  Component Value Date   CREATININE 1.15 (H) 07/29/2016   Lab Results  Component Value Date   INR 2.49 07/29/2016   CBG (last 3)   Recent Labs  07/29/16 0217 07/29/16 0846  GLUCAP 96 116*    PROBLEM LIST:    Principal Problem:   Diabetic foot ulcer ([place]) Active Problems:   Essential hypertension, benign   GERD (gastroesophageal reflux disease)   Warfarin anticoagulation   Atherosclerosis of native arteries of the extremities with gangrene (HCC)   Gangrene (HCC)   CURRENT MEDS:   . amLODipine  10 mg Oral Daily  . atorvastatin  10 mg Oral Daily  . bacitracin-polymyxin b  1 application Both Eyes BID  . chlorhexidine  15 mL Mouth/Throat BID  . cycloSPORINE  1 drop Both Eyes BID  . feeding supplement  (GLUCERNA SHAKE)  237 mL Oral Q24H  . fluticasone  2 spray Each Nare Daily  . free water  230 mL Per Tube TID  . furosemide  20 mg Oral Daily  . gabapentin  300 mg Oral TID  . insulin aspart  0-9 Units Subcutaneous TID WC  . insulin glargine  5 Units Subcutaneous Q2200  . latanoprost  1 drop Both Eyes QHS  . levothyroxine  137 mcg Oral QAC breakfast  . losartan  50 mg Oral Daily  . multivitamin with minerals  1 tablet Oral Daily  . polyvinyl alcohol  1 drop Both Eyes TID AC & HS  . sertraline  100 mg Oral Daily  . sodium chloride flush  3 mL Intravenous Q12H  . warfarin  2 mg Oral ONCE-1800  . Warfarin - Pharmacist Dosing Inpatient   Does not apply q1800   MRI: This shows a marrow signal abnormality involving the base of the second proximal phalanx overlying an area of soft tissue ulceration. There is possibly a tiny subcutaneous abscess noted. There is also plantar soft tissue inflammation noted along the base of the fourth and fifth digits.  Gae Gallop Beeper: [phone] Office: [phone] 07/29/2016

## 2016-07-29 NOTE — Progress Notes (Signed)
New Admission Note:  Arrival Method: By bed from the ED around 0200 Mental Orientation: Alert and oriented Telemetry: Box 26, CCMD notified Assessment: Completed Skin: Complete, refer to flowsheets IV: Left forearm Pain: Denies Tubes: G-Tube LUQ Safety Measures: Safety Fall Prevention Plan was given, discussed and signed. Admission: Completed [address] Orientation: Patient has been orientated to the room, unit and the staff. Family: None  Orders have been reviewed and implemented. Will continue to monitor the patient. Call light has been placed within reach and bed alarm has been activated.   Perry Mount, RN  Phone Number: [phone]

## 2016-07-29 NOTE — Progress Notes (Addendum)
ANTICOAGULATION CONSULT NOTE  Pharmacy Consult for Warfarin  Indication: atrial fibrillation and DVT  Patient Measurements: Height: 5\' 6"  (167.6 cm) Weight: 142 lb 14.4 oz (64.8 kg) IBW/kg (Calculated) : 59.3 Vital Signs: Temp: 98.4 [name] (36.9 C) (04/28 0851) Temp Source: Oral (04/28 0851) BP: 154/67 (04/28 0851) Pulse Rate: 82 (04/28 0851)  Labs:  Recent Labs  07/28/16 2032 07/28/16 2039 07/28/16 2229 07/29/16 0041  HGB 8.9* 9.5*  --  7.9*  HCT 26.0* 28.0*  --  23.4*  PLT 530*  --   --  591*  LABPROT  --   --  28.8* 27.4*  INR  --   --  2.65 2.49  CREATININE  --  1.30*  --  1.15*    Estimated Creatinine Clearance: 29.8 mL/min (A) (by C-G formula based on SCr of 1.15 mg/dL (H)).  Assessment:  83 y/o [name] on warfarin PTA, here for foot/leg pain found to have gangrene left foot with PVD. Holding warfarin per VVS note for possible vascular intervention.  INR is therapeutic at 2.49. No bleeding noted, Hgb trended down to 7.9, platelets are normal.  Warfarin PTA dosing: 2.5 mg on mondays, 3.75 mg all other days  Goal of Therapy:  INR 2-3 Monitor platelets by anticoagulation protocol: Yes   Plan:  - Warfarin on hold until pharmacy asked to resume - Consider starting heparin bridge when INR <2 for hx stroke - Daily INR - Monitor for s/sx of bleeding - Adjusted vancomycin to 1000 mg IV q24h   Renold Genta, PharmD, BCPS Clinical Pharmacist Phone for today - [place] - [phone] 07/29/2016 9:21 AM

## 2016-07-29 NOTE — Progress Notes (Signed)
Inpatient Diabetes Program Recommendations  AACE/ADA: New Consensus Statement on Inpatient Glycemic Control (2015)  Target Ranges:  Prepandial:   less than 140 mg/dL      Peak postprandial:   less than 180 mg/dL (1-2 hours)      Critically ill patients:  140 - 180 mg/dL   Review of Glycemic Control  Diabetes history: DM 2 Outpatient Diabetes medications: Lantus 5 units Current orders for Inpatient glycemic control: Lantus 5 units QHS, Novolog Sensitive tid  Inpatient Diabetes Program Recommendations:    Consult for Diabetic Foot Ulcer  Consider updated A1c level to assess glucose control over the past 2-3 months last A1c 8% on 05/30/16 however admitting glucose levels 89-101 mg/dl. Agree with current regimen. Will follow patient trends while inpatient.  Thanks,  Tama Headings RN, MSN, [facility] Inpatient Diabetes Coordinator Team Pager [phone] (8a-5p)

## 2016-07-29 NOTE — Progress Notes (Signed)
Shannon [name] VHQ:[id number] DOB: [birth year] DOA: 07/28/2016 PCP: Kirt Boys, DO  Brief narrative:  56 fem CKD III Afib CHad2vasc2 = 6 + Tachybradtysyndrome COPD DVT in past CVA Chr LCerebellear infracts HTN ty II DM GO Obstrution s/p G tube HLD GERD DIastolic HF hypothryoid Pressure ulcer  This is her 6th or th admission for various issue sin the past 6 mo She has been offered pallaitive care in the past as her condition is the sum of her mu;tiple issues  She presented from Vascular surgery office with gangrene-it is felt that she has few options other than amputation left to consider  Past medical history-As per Problem list Chart reviewed as below-   Consultants:    Procedures:    Antibiotics:  Cefepime  vancomycin   Subjective   Fair Being bathed No pain Not willing to engage regarding decisions about her leg   Objective    Interim History:   Telemetry: nsr   Objective: Vitals:   07/28/16 2230 07/28/16 2300 07/29/16 0551 07/29/16 0851  BP: (!) 165/69 (!) 153/62 (!) 150/62 (!) 154/67  Pulse: 89 80 79 82  Resp: 18 18 18 16   Temp:   98.4 F (36.9 C) 98.4 F (36.9 C)  TempSrc:   Oral Oral  SpO2: 100% 98% 92% 96%  Weight:   64.8 kg (142 lb 14.4 oz)   Height:        Intake/Output Summary (Last 24 hours) at 07/29/16 1320 Last data filed at 07/29/16 1100  Gross per 24 hour  Intake              950 ml  Output             1651 ml  Net             -701 ml    Exam:  General: alert frail in nad Cardiovascular: s1 s 2no m/r/g Respiratory: clear  Abdomen: soft Skinnot examined today Neuro alert intact-moves 4 limbs equally  Data Reviewed: Basic Metabolic Panel:  Recent Labs Lab 07/28/16 2039 07/29/16 0041  NA 136 135  K 4.2 4.5  CL 102 102  CO2  --  23  GLUCOSE 101* 89  BUN 43* 42*  CREATININE 1.30* 1.15*  CALCIUM  --  9.8   Liver Function Tests: No results for input(s): AST, ALT, ALKPHOS, BILITOT, PROT, ALBUMIN in  the last 168 hours. No results for input(s): LIPASE, AMYLASE in the last 168 hours. No results for input(s): AMMONIA in the last 168 hours. CBC:  Recent Labs Lab 07/28/16 2032 07/28/16 2039 07/29/16 0041  WBC 9.1  --  11.5*  NEUTROABS 6.4  --   --   HGB 8.9* 9.5* 7.9*  HCT 26.0* 28.0* 23.4*  MCV 85.5  --  86.0  PLT 530*  --  591*   Cardiac Enzymes: No results for input(s): CKTOTAL, CKMB, CKMBINDEX, TROPONINI in the last 168 hours. BNP: Invalid input(s): POCBNP CBG:  Recent Labs Lab 07/29/16 0217 07/29/16 0846 07/29/16 1145  GLUCAP 96 116* 168*    Recent Results (from the past 240 hour(s))  MRSA PCR Screening     Status: None   Collection Time: 07/29/16  2:47 AM  Result Value Ref Range Status   MRSA by PCR NEGATIVE NEGATIVE Final    Comment:        The GeneXpert MRSA Assay (FDA approved for NASAL specimens only), is one component of a comprehensive MRSA colonization surveillance program. It is not intended to diagnose  MRSA infection nor to guide or monitor treatment for MRSA infections.      Studies:              All Imaging reviewed and is as per above notation   Scheduled Meds: . amLODipine  10 mg Oral Daily  . atorvastatin  10 mg Oral Daily  . bacitracin-polymyxin b  1 application Both Eyes BID  . chlorhexidine  15 mL Mouth/Throat BID  . cycloSPORINE  1 drop Both Eyes BID  . feeding supplement (NEPRO CARB STEADY)  237 mL Oral BID BM  . feeding supplement (OSMOLITE 1.5 CAL)  1,000 mL Per Tube Q24H  . feeding supplement (PRO-STAT SUGAR FREE 64)  30 mL Per Tube Daily  . fluticasone  2 spray Each Nare Daily  . free water  230 mL Per Tube TID  . furosemide  20 mg Oral Daily  . gabapentin  300 mg Oral TID  . insulin aspart  0-9 Units Subcutaneous TID WC  . insulin glargine  5 Units Subcutaneous Q2200  . latanoprost  1 drop Both Eyes QHS  . levothyroxine  137 mcg Oral QAC breakfast  . losartan  50 mg Oral Daily  . multivitamin with minerals  1 tablet  Oral Daily  . polyvinyl alcohol  1 drop Both Eyes TID AC & HS  . sertraline  100 mg Oral Daily  . sodium chloride flush  3 mL Intravenous Q12H  . Warfarin - Pharmacist Dosing Inpatient   Does not apply q1800   Continuous Infusions: . ceFEPime (MAXIPIME) IV Stopped (07/29/16 0234)  . metronidazole Stopped (07/29/16 0906)  . vancomycin       Assessment/Plan:  Gangrene and osteomyelitis LLE 2nd toe Not really considering amputaiton Cont vanc and cefepime + flagyll Await family to discuss  Afib chadvasc=6 Cont coumadin per pharm D-INR 2.4 and therapetuic Not on rt control presumably 2/2 to tachy-brady  Prior CVA  on coumadin  COPD No wheeze.  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

## 2016-07-30 LAB — PROTIME-INR
INR: 2.28
Prothrombin Time: 25.5 seconds — ABNORMAL HIGH (ref 11.4–15.2)

## 2016-07-30 LAB — GLUCOSE, CAPILLARY
Glucose-Capillary: 167 mg/dL — ABNORMAL HIGH (ref 65–99)
Glucose-Capillary: 170 mg/dL — ABNORMAL HIGH (ref 65–99)
Glucose-Capillary: 135 mg/dL — ABNORMAL HIGH (ref 65–99)
Glucose-Capillary: 131 mg/dL — ABNORMAL HIGH (ref 65–99)
Glucose-Capillary: 109 mg/dL — ABNORMAL HIGH (ref 65–99)

## 2016-07-30 MED ORDER — WARFARIN SODIUM 5 MG PO TABS
5.0000 mg | ORAL_TABLET | Freq: Once | ORAL | Status: AC
  Administered 2016-07-30: 5 mg via ORAL
  Filled 2016-07-30: qty 1

## 2016-07-30 NOTE — Progress Notes (Signed)
   VASCULAR SURGERY ASSESSMENT & PLAN:   I discussed the situation yesterday with the patient and with the family. They have decided to not consider primary amputation or arteriography at this time.   Continue antibiotics and wound care (I have written orders for wound care.)   SUBJECTIVE:   No complaints.   PHYSICAL EXAM:   Vitals:   07/29/16 0851 07/29/16 1621 07/29/16 2017 07/30/16 0500  BP: (!) 154/67 (!) 148/56 (!) 142/56 (!) 149/50  Pulse: 82 80 78 77  Resp: 16 18 18 18   Temp: 98.4 F (36.9 C) 98.4 F (36.9 C) 98.2 F (36.8 C) 98.3 F (36.8 C)  TempSrc: Oral Oral Oral Oral  SpO2: 96% 100% 97% 99%  Weight:   145 lb 1.6 oz (65.8 kg)   Height:       Dry gangrene of foot unchanged.   LABS:   Lab Results  Component Value Date   WBC 11.5 (H) 07/29/2016   HGB 7.9 (L) 07/29/2016   HCT 23.4 (L) 07/29/2016   MCV 86.0 07/29/2016   PLT 591 (H) 07/29/2016   Lab Results  Component Value Date   CREATININE 1.15 (H) 07/29/2016   Lab Results  Component Value Date   INR 2.28 07/30/2016   CBG (last 3)   Recent Labs  07/29/16 2340 07/30/16 0403 07/30/16 0808  GLUCAP 177* 167* 170*    PROBLEM LIST:    Principal Problem:   Diabetic foot ulcer ([place]) Active Problems:   Essential hypertension, benign   GERD (gastroesophageal reflux disease)   Warfarin anticoagulation   Atherosclerosis of native arteries of the extremities with gangrene (HCC)   Gangrene (HCC)   CURRENT MEDS:   . amLODipine  10 mg Oral Daily  . atorvastatin  10 mg Oral Daily  . bacitracin-polymyxin b  1 application Both Eyes BID  . chlorhexidine  15 mL Mouth/Throat BID  . cycloSPORINE  1 drop Both Eyes BID  . feeding supplement (NEPRO CARB STEADY)  237 mL Oral BID BM  . feeding supplement (OSMOLITE 1.5 CAL)  1,000 mL Per Tube Q24H  . feeding supplement (PRO-STAT SUGAR FREE 64)  30 mL Per Tube Daily  . fluticasone  2 spray Each Nare Daily  . free water  230 mL Per Tube TID  . gabapentin   300 mg Oral TID  . insulin aspart  0-9 Units Subcutaneous TID WC  . insulin glargine  5 Units Subcutaneous Q2200  . latanoprost  1 drop Both Eyes QHS  . levothyroxine  137 mcg Oral QAC breakfast  . losartan  50 mg Oral Daily  . multivitamin with minerals  1 tablet Oral Daily  . polyvinyl alcohol  1 drop Both Eyes TID AC & HS  . sertraline  100 mg Oral Daily  . sodium chloride flush  3 mL Intravenous Q12H  . Warfarin - Pharmacist Dosing Inpatient   Does not apply [place]: [phone] Office: [phone] 07/30/2016

## 2016-07-30 NOTE — Progress Notes (Signed)
Shannon [name] ZOX:[id number] DOB: 06-Oct-1923 DOA: 07/28/2016 PCP: Kirt Boys, DO  Brief narrative:  20 fem CKD III Afib CHad2vasc2 = 6 + Tachybradysyndrome COPD DVT in past CVA Chr LCerebellear infracts HTN ty II DM GO Obstrution s/p G tube HLD GERD DIastolic HF hypothryoid Pressure ulcer  This is her 6th or th admission for various issue sin the past 6 mo She has been offered pallaitive care in the past as her condition is the sum of her mu;tiple issues  She presented from Vascular surgery office with gangrene-it is felt that she has few options other than amputation left to consider She is unwilling to undergo amoputation and wishes to try medication only  Past medical history-As per Problem list Chart reviewed as below-   Consultants:    Procedures:    Antibiotics:  Cefepime  vancomycin   Subjective   Fair No new issues Pain is not bad Asking to have wound dressed eaitng and drinking No other c/o   Objective    Interim History:   Telemetry: nsr   Objective: Vitals:   07/29/16 1621 07/29/16 2017 07/30/16 0500 07/30/16 0922  BP: (!) 148/56 (!) 142/56 (!) 149/50 (!) 152/66  Pulse: 80 78 77 78  Resp: 18 18 18 16   Temp: 98.4 F (36.9 C) 98.2 F (36.8 C) 98.3 F (36.8 C) 97.7 F (36.5 C)  TempSrc: Oral Oral Oral Oral  SpO2: 100% 97% 99% 99%  Weight:  65.8 kg (145 lb 1.6 oz)    Height:        Intake/Output Summary (Last 24 hours) at 07/30/16 1324 Last data filed at 07/30/16 1154  Gross per 24 hour  Intake          2576.75 ml  Output             1153 ml  Net          1423.75 ml    Exam:  General: alert frail in nad Cardiovascular: s1 s 2no m/r/g Respiratory: clear  Abdomen: soft Skinnot examined today Neuro alert intact-moves 4 limbs equally  Data Reviewed: Basic Metabolic Panel:  Recent Labs Lab 07/28/16 2039 07/29/16 0041  NA 136 135  K 4.2 4.5  CL 102 102  CO2  --  23  GLUCOSE 101* 89  BUN 43* 42*    CREATININE 1.30* 1.15*  CALCIUM  --  9.8   Liver Function Tests: No results for input(s): AST, ALT, ALKPHOS, BILITOT, PROT, ALBUMIN in the last 168 hours. No results for input(s): LIPASE, AMYLASE in the last 168 hours. No results for input(s): AMMONIA in the last 168 hours. CBC:  Recent Labs Lab 07/28/16 2032 07/28/16 2039 07/29/16 0041  WBC 9.1  --  11.5*  NEUTROABS 6.4  --   --   HGB 8.9* 9.5* 7.9*  HCT 26.0* 28.0* 23.4*  MCV 85.5  --  86.0  PLT 530*  --  591*   Cardiac Enzymes: No results for input(s): CKTOTAL, CKMB, CKMBINDEX, TROPONINI in the last 168 hours. BNP: Invalid input(s): POCBNP CBG:  Recent Labs Lab 07/29/16 2016 07/29/16 2340 07/30/16 0403 07/30/16 0808 07/30/16 1140  GLUCAP 130* 177* 167* 170* 135*    Recent Results (from the past 240 hour(s))  MRSA PCR Screening     Status: None   Collection Time: 07/29/16  2:47 AM  Result Value Ref Range Status   MRSA by PCR NEGATIVE NEGATIVE Final    Comment:        The GeneXpert MRSA  Assay (FDA approved for NASAL specimens only), is one component of a comprehensive MRSA colonization surveillance program. It is not intended to diagnose MRSA infection nor to guide or monitor treatment for MRSA infections.      Studies:              All Imaging reviewed and is as per above notation   Scheduled Meds: . amLODipine  10 mg Oral Daily  . atorvastatin  10 mg Oral Daily  . bacitracin-polymyxin b  1 application Both Eyes BID  . chlorhexidine  15 mL Mouth/Throat BID  . cycloSPORINE  1 drop Both Eyes BID  . feeding supplement (NEPRO CARB STEADY)  237 mL Oral BID BM  . feeding supplement (OSMOLITE 1.5 CAL)  1,000 mL Per Tube Q24H  . feeding supplement (PRO-STAT SUGAR FREE 64)  30 mL Per Tube Daily  . fluticasone  2 spray Each Nare Daily  . free water  230 mL Per Tube TID  . gabapentin  300 mg Oral TID  . insulin aspart  0-9 Units Subcutaneous TID WC  . insulin glargine  5 Units Subcutaneous Q2200  .  latanoprost  1 drop Both Eyes QHS  . levothyroxine  137 mcg Oral QAC breakfast  . losartan  50 mg Oral Daily  . multivitamin with minerals  1 tablet Oral Daily  . polyvinyl alcohol  1 drop Both Eyes TID AC & HS  . sertraline  100 mg Oral Daily  . sodium chloride flush  3 mL Intravenous Q12H  . warfarin  5 mg Oral ONCE-1800  . Warfarin - Pharmacist Dosing Inpatient   Does not apply q1800   Continuous Infusions: . ceFEPime (MAXIPIME) IV Stopped (07/29/16 2135)  . metronidazole Stopped (07/30/16 1124)  . vancomycin Stopped (07/30/16 0053)     Assessment/Plan:  Gangrene and osteomyelitis LLE 2nd toe Not really considering amputaiton Cont vanc and cefepime + flagyll--trasntiion in 24-48 hours to PO abx eval PT  Afib chadvasc=6 Cont coumadin per pharm D-INR 2.4 and therapetuic Not on rt control presumably 2/2 to tachy-brady  Prior CVA  on coumadin INR therapeutic-cont Coumadin 5  stg III CKD At around usual baseline Monitor peridocially  COPD No wheeze.  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

## 2016-07-30 NOTE — Progress Notes (Signed)
   HPI:  Patient presents today with her daughter for evaluation of acute ischemic changes to the left foot. Patient states that over the last few days she noticed discoloration to her toes as well as swelling. Patient presents today for emergent evaluation.    Physical Exam: The patient is alert and oriented x3 in no acute distress. Left lower extremity is cold to touch. There is edema noted to the left foot as well with serous drainage. Acute ischemic gangrenous changes also noted to the second digit left foot. Vascular: Pulses are nonpalpable. Skin is cold to touch. Neurological: Epicritic and protective threshold absent bilaterally.   Assessment: 1. Acute ischemic changes left lower extremity.  Plan of Care:  1. Patient was evaluated. Dry sterile dressings were applied 2. Recommend the patient go to the emergency department for admission and further vascular workup. The patient does not want to go to the emergency department despite the seriousness of the condition. We are going to arrange for the patient an appointment for vascular consultation and workup as soon as possible. 3.  From a foot and ankle standpoint, there is no further care that can be provided. This appears to be more of a vascular issue. Return to clinic when necessary   Edrick Kins, DPM [facility]  Dr. Edrick Kins, [place]                                        [place], [place] [address]                Office [phone]  Fax [phone]

## 2016-07-30 NOTE — Discharge Instructions (Signed)

## 2016-07-30 NOTE — Progress Notes (Addendum)
ANTICOAGULATION CONSULT NOTE  Pharmacy Consult for Warfarin  Indication: atrial fibrillation and DVT  Patient Measurements: Height: 5\' 6"  (167.6 cm) Weight: 145 lb 1.6 oz (65.8 kg) IBW/kg (Calculated) : 59.3  Vital Signs: Temp: 97.7 F (36.5 C) (04/29 0922) Temp Source: Oral (04/29 0922) BP: 152/66 (04/29 0922) Pulse Rate: 78 (04/29 0922)  Labs:  Recent Labs  07/28/16 2032 07/28/16 2039 07/28/16 2229 07/29/16 0041 07/30/16 0458  HGB 8.9* 9.5*  --  7.9*  --   HCT 26.0* 28.0*  --  23.4*  --   PLT 530*  --   --  591*  --   LABPROT  --   --  28.8* 27.4* 25.5*  INR  --   --  2.65 2.49 2.28  CREATININE  --  1.30*  --  1.15*  --     Estimated Creatinine Clearance: 29.8 mL/min (A) (by C-G formula based on SCr of 1.15 mg/dL (H)).  Assessment:  83 y/o F on warfarin PTA, here for foot/leg pain found to have gangrene left foot with PVD. Warfarin held 4/28 awaiting family decision for vascular intervention - decision was made not to pursue amputation or arteriography per VVS note.  INR is therapeutic at 2.28 despite missed dose. No bleeding noted, no new CBC. Watch INR closely with DDI with Flagyl.  Warfarin PTA dosing: 2.5 mg on mondays, 3.75 mg all other days  Goal of Therapy:  INR 2-3 Monitor platelets by anticoagulation protocol: Yes   Plan:  - Warfarin 5 mg PO tonight (extra for missed dose) - Daily INR - Monitor for s/sx of bleeding   Renold Genta, PharmD, BCPS Clinical Pharmacist Phone for today - [place] - [phone] 07/30/2016 11:01 AM

## 2016-07-31 ENCOUNTER — Ambulatory Visit: Payer: Medicare Other | Admitting: Internal Medicine

## 2016-07-31 ENCOUNTER — Ambulatory Visit: Payer: Medicare Other | Admitting: Physician Assistant

## 2016-07-31 LAB — CBC
HCT: 26.9 % — ABNORMAL LOW (ref 36.0–46.0)
Hemoglobin: 8.9 g/dL — ABNORMAL LOW (ref 12.0–15.0)
MCH: 28.5 pg (ref 26.0–34.0)
MCHC: 33.1 g/dL (ref 30.0–36.0)
MCV: 86.2 fL (ref 78.0–100.0)
Platelets: 526 10*3/uL — ABNORMAL HIGH (ref 150–400)
RBC: 3.12 MIL/uL — ABNORMAL LOW (ref 3.87–5.11)
RDW: 18.2 % — ABNORMAL HIGH (ref 11.5–15.5)
WBC: 8.6 10*3/uL (ref 4.0–10.5)

## 2016-07-31 LAB — GLUCOSE, CAPILLARY
Glucose-Capillary: 153 mg/dL — ABNORMAL HIGH (ref 65–99)
Glucose-Capillary: 137 mg/dL — ABNORMAL HIGH (ref 65–99)
Glucose-Capillary: 138 mg/dL — ABNORMAL HIGH (ref 65–99)
Glucose-Capillary: 197 mg/dL — ABNORMAL HIGH (ref 65–99)

## 2016-07-31 LAB — PROTIME-INR
INR: 2.16
Prothrombin Time: 24.4 seconds — ABNORMAL HIGH (ref 11.4–15.2)

## 2016-07-31 MED ORDER — WARFARIN SODIUM 2.5 MG PO TABS
2.5000 mg | ORAL_TABLET | Freq: Once | ORAL | Status: AC
  Administered 2016-07-31: 2.5 mg via ORAL
  Filled 2016-07-31: qty 1

## 2016-07-31 MED ORDER — CLINDAMYCIN HCL 300 MG PO CAPS
300.0000 mg | ORAL_CAPSULE | Freq: Three times a day (TID) | ORAL | Status: DC
  Administered 2016-07-31 – 2016-08-01 (×5): 300 mg via ORAL
  Filled 2016-07-31 (×5): qty 1

## 2016-07-31 NOTE — Progress Notes (Signed)
ANTICOAGULATION CONSULT NOTE  Pharmacy Consult for Warfarin  Indication: atrial fibrillation and DVT  Patient Measurements: Height: 5\' 6"  (167.6 cm) Weight: 145 lb 1.6 oz (65.8 kg) IBW/kg (Calculated) : 59.3  Vital Signs: Temp: 98.6 F (37 C) (04/30 0923) Temp Source: Oral (04/30 0923) BP: 135/61 (04/30 0923) Pulse Rate: 73 (04/30 0923)  Labs:  Recent Labs  07/28/16 2032 07/28/16 2039  07/29/16 0041 07/30/16 0458 07/31/16 0452  HGB 8.9* 9.5*  --  7.9*  --  8.9*  HCT 26.0* 28.0*  --  23.4*  --  26.9*  PLT 530*  --   --  591*  --  526*  LABPROT  --   --   < > 27.4* 25.5* 24.4*  INR  --   --   < > 2.49 2.28 2.16  CREATININE  --  1.30*  --  1.15*  --   --   < > = values in this interval not displayed.  Estimated Creatinine Clearance: 29.8 mL/min (A) (by C-G formula based on SCr of 1.15 mg/dL (H)).  Assessment:  83 y/o F on warfarin PTA, here for foot/leg pain found to have gangrene left foot with PVD. Warfarin held 4/28 awaiting family decision for vascular intervention - decision was made not to pursue amputation or arteriography per VVS note.  INR is therapeutic at 2.16 this morning. Hgb improved to 8.9, platelets remain elevated- no bleeding noted.  Warfarin PTA dosing: 2.5 mg on mondays, 3.75 mg all other days  Goal of Therapy:  INR 2-3 Monitor platelets by anticoagulation protocol: Yes   Plan:  - Warfarin 2.5 mg PO tonight as per home dosing regimen - Daily INR - Monitor for s/sx of bleeding - Watch for interaction with clindamycin PO which is being started today  Agron Swiney D. Herschell Virani, PharmD, BCPS Clinical Pharmacist Pager: [phone] 07/31/2016 10:48 AM

## 2016-07-31 NOTE — Progress Notes (Signed)
Shannon [name] XBJ:[id number] DOB: Nov 03, 1923 DOA: 07/28/2016 PCP: Kirt Boys, DO  Brief narrative:  31 fem CKD III Afib CHad2vasc2 = 6 + Tachybradysyndrome COPD DVT in past CVA Chr L Cerebellear infracts HTN ty II DM GO Obstrution s/p G tube HLD GERD Diastolic HF hypothryoid Pressure ulcer  This is her 6th or th admission for various issue sin the past 6 mo She has been offered pallaitive care in the past as her condition is the sum of her mu;tiple issues  She presented from Vascular surgery office with gangrene-it is felt that she has few options other than amputation left to consider She is unwilling to undergo amoputation and wishes to try medication only  Past medical history-As per Problem list Chart reviewed as below-   Consultants:  Vascular  Procedures:  none  Antibiotics:  Cefepime-->4/30  Vancomycin-->4/30   Subjective   Fair tol tube feeds no n/v No pains    Objective    Interim History:   Telemetry: nsr   Objective: Vitals:   07/30/16 1630 07/30/16 2028 07/31/16 0538 07/31/16 0923  BP: (!) 147/67 (!) 145/69 (!) 148/64 135/61  Pulse: 76 79 73 73  Resp: 16 16 16 18   Temp: 98.1 F (36.7 C) 98.1 F (36.7 C) 98.3 F (36.8 C) 98.6 F (37 C)  TempSrc: Oral Oral Oral Oral  SpO2: 99% 97% 95% 100%  Weight:      Height:        Intake/Output Summary (Last 24 hours) at 07/31/16 1045 Last data filed at 07/31/16 0944  Gross per 24 hour  Intake             1323 ml  Output              100 ml  Net             1223 ml    Exam:  General: alert frail in nad Cardiovascular: s1 s 2no m/r/g Respiratory: clear  Abdomen: soft Skin-wound on foot looks black--no oozing or othe rissue Neuro alert intact-moves 4 limbs equally  Data Reviewed: Basic Metabolic Panel:  Recent Labs Lab 07/28/16 2039 07/29/16 0041  NA 136 135  K 4.2 4.5  CL 102 102  CO2  --  23  GLUCOSE 101* 89  BUN 43* 42*  CREATININE 1.30* 1.15*  CALCIUM  --   9.8   Liver Function Tests: No results for input(s): AST, ALT, ALKPHOS, BILITOT, PROT, ALBUMIN in the last 168 hours. No results for input(s): LIPASE, AMYLASE in the last 168 hours. No results for input(s): AMMONIA in the last 168 hours. CBC:  Recent Labs Lab 07/28/16 2032 07/28/16 2039 07/29/16 0041 07/31/16 0452  WBC 9.1  --  11.5* 8.6  NEUTROABS 6.4  --   --   --   HGB 8.9* 9.5* 7.9* 8.9*  HCT 26.0* 28.0* 23.4* 26.9*  MCV 85.5  --  86.0 86.2  PLT 530*  --  591* 526*   Cardiac Enzymes: No results for input(s): CKTOTAL, CKMB, CKMBINDEX, TROPONINI in the last 168 hours. BNP: Invalid input(s): POCBNP CBG:  Recent Labs Lab 07/30/16 0808 07/30/16 1140 07/30/16 1630 07/30/16 2023 07/31/16 0726  GLUCAP 170* 135* 131* 109* 153*    Recent Results (from the past 240 hour(s))  Culture, blood (routine x 2)     Status: None (Preliminary result)   Collection Time: 07/29/16 12:50 AM  Result Value Ref Range Status   Specimen Description BLOOD LEFT HAND  Final   Special  Requests   Final    BOTTLES DRAWN AEROBIC ONLY Blood Culture adequate volume   Culture NO GROWTH 1 DAY  Final   Report Status PENDING  Incomplete  Culture, blood (routine x 2)     Status: None (Preliminary result)   Collection Time: 07/29/16 12:58 AM  Result Value Ref Range Status   Specimen Description BLOOD RIGHT HAND  Final   Special Requests   Final    BOTTLES DRAWN AEROBIC ONLY Blood Culture adequate volume   Culture NO GROWTH 1 DAY  Final   Report Status PENDING  Incomplete  MRSA PCR Screening     Status: None   Collection Time: 07/29/16  2:47 AM  Result Value Ref Range Status   MRSA by PCR NEGATIVE NEGATIVE Final    Comment:        The GeneXpert MRSA Assay (FDA approved for NASAL specimens only), is one component of a comprehensive MRSA colonization surveillance program. It is not intended to diagnose MRSA infection nor to guide or monitor treatment for MRSA infections.      Studies:                   All Imaging reviewed and is as per above notation   Scheduled Meds: . amLODipine  10 mg Oral Daily  . atorvastatin  10 mg Oral Daily  . bacitracin-polymyxin b  1 application Both Eyes BID  . chlorhexidine  15 mL Mouth/Throat BID  . clindamycin  300 mg Oral Q8H  . cycloSPORINE  1 drop Both Eyes BID  . feeding supplement (NEPRO CARB STEADY)  237 mL Oral BID BM  . feeding supplement (OSMOLITE 1.5 CAL)  1,000 mL Per Tube Q24H  . feeding supplement (PRO-STAT SUGAR FREE 64)  30 mL Per Tube Daily  . fluticasone  2 spray Each Nare Daily  . free water  230 mL Per Tube TID  . gabapentin  300 mg Oral TID  . insulin aspart  0-9 Units Subcutaneous TID WC  . insulin glargine  5 Units Subcutaneous Q2200  . latanoprost  1 drop Both Eyes QHS  . levothyroxine  137 mcg Oral QAC breakfast  . losartan  50 mg Oral Daily  . multivitamin with minerals  1 tablet Oral Daily  . polyvinyl alcohol  1 drop Both Eyes TID AC & HS  . sertraline  100 mg Oral Daily  . sodium chloride flush  3 mL Intravenous Q12H  . warfarin  2.5 mg Oral ONCE-1800  . Warfarin - Pharmacist Dosing Inpatient   Does not apply q1800   Continuous Infusions:    Assessment/Plan:  Gangrene and osteomyelitis LLE 2nd toe Not really considering amputaiton Cont vanc and cefepime + flagyll--trasntiion to PO clinda for 3-4 weeks and review as an OP eval PT  Afib chadvasc=6 Cont coumadin per pharm D-INR 2.1 and therapetuic Not on rt control presumably 2/2 to tachy-brady  Prior CVA  on coumadin INR therapeutic-cont Coumadin 5  stg III CKD At around usual baseline Monitor peridocially  COPD No wheeze.  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

## 2016-07-31 NOTE — Progress Notes (Deleted)
Cardiology Office Note    Date:  07/31/2016   ID:  Shannon [name], DOB 1923-07-02, MRN [id number]  PCP:  Kirt Boys, DO  Cardiologist:   No chief complaint on file.   History of Present Illness:  Shannon [name] is a 83 y.o. female ***    Past Medical History:  Diagnosis Date  . Anemia    previous blood transfusions  . Arthritis    "all over"  . Asthma   . Bradycardia    requiring previous d/c of BB and reduction of amiodarone  . CAD (coronary artery disease)    nonobstructive per notes  . Chronic diastolic CHF (congestive heart failure) (HCC)   . CKD (chronic kidney disease), stage III   . Complication of blood transfusion    "got the wrong blood type at [facility] in ~ 2015; no adverse reaction that we are aware of"/daughter, [name] (01/27/2016)  . COPD (chronic obstructive pulmonary disease) (HCC)   . Depression    "light case"  . DVT (deep venous thrombosis) (HCC) 01/2016   a. LLE DVT 01/2016 - switched from Eliquis to Coumadin.  . Gastric stenosis    a. s/p stomach tube  . GERD (gastroesophageal reflux disease)   . History of blood transfusion    "several" (01/27/2016)  . History of stomach ulcers   . Hyperlipidemia   . Hypertension   . Hypothyroidism   . Paraesophageal hernia   . Perforated gastric ulcer (HCC)   . Seasonal allergies   . SIADH (syndrome of inappropriate ADH production) (HCC)    [name] 01/10/2015  . Small bowel obstruction (HCC)    "I don't know how many" (01/11/2015)  . Stroke (HCC)    "light one"  . Type II diabetes mellitus (HCC)    "related to prednisone use  for > 20 yr; once predinose stopped; no more DM RX" (01/27/2016)  . Ventral hernia with bowel obstruction     Past Surgical History:  Procedure Laterality Date  . CATARACT EXTRACTION W/ INTRAOCULAR LENS  IMPLANT, BILATERAL    . CHOLECYSTECTOMY OPEN    . COLECTOMY    . ESOPHAGOGASTRODUODENOSCOPY N/A 01/19/2014   Procedure: ESOPHAGOGASTRODUODENOSCOPY (EGD);  Surgeon: Hilarie Fredrickson, MD;  Location: [facility];  Service: Endoscopy;  Laterality: N/A;  . ESOPHAGOGASTRODUODENOSCOPY N/A 01/20/2014   Procedure: ESOPHAGOGASTRODUODENOSCOPY (EGD);  Surgeon: Hilarie Fredrickson, MD;  Location: [facility];  Service: Endoscopy;  Laterality: N/A;  . ESOPHAGOGASTRODUODENOSCOPY N/A 03/19/2014   Procedure: ESOPHAGOGASTRODUODENOSCOPY (EGD);  Surgeon: Rachael Fee, MD;  Location: [facility];  Service: Endoscopy;  Laterality: N/A;  . ESOPHAGOGASTRODUODENOSCOPY N/A 07/08/2015   Procedure: ESOPHAGOGASTRODUODENOSCOPY (EGD);  Surgeon: Sherrilyn Rist, MD;  Location: [facility] ENDOSCOPY;  Service: Endoscopy;  Laterality: N/A;  . ESOPHAGOGASTRODUODENOSCOPY (EGD) WITH PROPOFOL N/A 09/15/2015   Procedure: ESOPHAGOGASTRODUODENOSCOPY (EGD) WITH PROPOFOL;  Surgeon: Ruffin Frederick, MD;  Location: [facility] ENDOSCOPY;  Service: Gastroenterology;  Laterality: N/A;  . GASTROJEJUNOSTOMY     hx/notes 01/10/2015  . GASTROJEJUNOSTOMY N/A 09/23/2015   Procedure: OPEN GASTROJEJUNOSTOMY TUBE PLACEMENT;  Surgeon: De Blanch Kinsinger, MD;  Location: [facility] ORS;  Service: General;  Laterality: N/A;  . GLAUCOMA SURGERY Bilateral   . HERNIA REPAIR  2015  . IR GENERIC HISTORICAL  01/07/2016   IR GJ TUBE CHANGE 01/07/2016 Malachy Moan, MD [facility]  . IR GENERIC HISTORICAL  01/27/2016   IR MECH REMOV OBSTRUC MAT ANY COLON TUBE W/FLUORO 01/27/2016 Richarda Overlie, MD [facility]  . IR GENERIC HISTORICAL  02/07/2016  IR PATIENT EVAL TECH 0-60 MINS Darrell K Allred, PA-C [facility]  . IR GENERIC HISTORICAL  02/08/2016   IR GJ TUBE CHANGE 02/08/2016 Berdine Dance, MD [facility]  . IR GENERIC HISTORICAL  01/06/2016   IR GJ TUBE CHANGE 01/06/2016 [facility]-RAD OUT REF  . IR GENERIC HISTORICAL  05/02/2016   IR CM INJ ANY COLONIC TUBE W/FLUORO 05/02/2016 Oley Balm, MD [facility]  . IR GENERIC HISTORICAL  05/15/2016   IR GJ TUBE CHANGE 05/15/2016 Simonne Come, MD [facility]  . IR GENERIC HISTORICAL  06/28/2016   IR GJ TUBE  CHANGE 06/28/2016 [facility]  . LAPAROTOMY N/A 01/20/2015   Procedure: EXPLORATORY LAPAROTOMY;  Surgeon: Abigail Miyamoto, MD;  Location: [facility] OR;  Service: General;  Laterality: N/A;  . LYSIS OF ADHESION N/A 01/20/2015   Procedure: LYSIS OF ADHESIONS < 1 HOUR;  Surgeon: Abigail Miyamoto, MD;  Location: [facility] OR;  Service: General;  Laterality: N/A;  . TONSILLECTOMY    . TUBAL LIGATION    . VENTRAL HERNIA REPAIR  2015   incarcerated ventral hernia ([facility] 09/2013)/notes 01/10/2015    Current Medications: Facility-Administered Medications Prior to Visit  Medication Dose Route Frequency Provider Last Rate Last Dose  . acetaminophen (TYLENOL) tablet 650 mg  650 mg Oral Q6H PRN Michael Litter, MD       Or  . acetaminophen (TYLENOL) suppository 650 mg  650 mg Rectal Q6H PRN Michael Litter, MD      . amLODipine (NORVASC) tablet 10 mg  10 mg Oral Daily Michael Litter, MD   10 mg at 07/30/16 1123  . atorvastatin (LIPITOR) tablet 10 mg  10 mg Oral Daily Michael Litter, MD   10 mg at 07/30/16 1123  . bacitracin-polymyxin b (POLYSPORIN) ophthalmic ointment 1 application  1 application Both Eyes BID Michael Litter, MD   1 application at 07/30/16 2331  . bisacodyl (DULCOLAX) suppository 10 mg  10 mg Rectal Daily PRN Michael Litter, MD      . ceFEPIme (MAXIPIME) 1 g in dextrose 5 % 50 mL IVPB  1 g Intravenous Q24H Stevphen Rochester, RPH   Stopped at 07/30/16 2337  . chlorhexidine (PERIDEX) 0.12 % solution 15 mL  15 mL Mouth/Throat BID Michael Litter, MD   15 mL at 07/30/16 2327  . cycloSPORINE (RESTASIS) 0.05 % ophthalmic emulsion 1 drop  1 drop Both Eyes BID Michael Litter, MD   1 drop at 07/30/16 2330  . feeding supplement (NEPRO CARB STEADY) liquid 237 mL  237 mL Oral BID BM Rhetta Mura, MD   237 mL at 07/30/16 1127  . feeding supplement (OSMOLITE 1.5 CAL) liquid 1,000 mL  1,000 mL Per Tube Q24H Rhetta Mura, MD 65 mL/hr at 07/30/16 2207 1,000 mL at 07/30/16 2207  . feeding supplement (PRO-STAT SUGAR FREE 64)  liquid 30 mL  30 mL Per Tube Daily Rhetta Mura, MD   30 mL at 07/30/16 1124  . fluticasone (FLONASE) 50 MCG/ACT nasal spray 2 spray  2 spray Each Nare Daily Michael Litter, MD   2 spray at 07/30/16 [phone]  . free water 230 mL  230 mL Per Tube TID Michael Litter, MD   230 mL at 07/30/16 2200  . gabapentin (NEURONTIN) capsule 300 mg  300 mg Oral TID Michael Litter, MD   300 mg at 07/30/16 2330  . insulin aspart (novoLOG) injection 0-9 Units  0-9 Units Subcutaneous TID WC Michael Litter, MD   2 Units at 07/31/16 [phone]  . insulin glargine (LANTUS)  injection 5 Units  5 Units Subcutaneous Q2200 Michael Litter, MD   5 Units at 07/30/16 2328  . ipratropium-albuterol (DUONEB) 0.5-2.5 (3) MG/3ML nebulizer solution 3 mL  3 mL Nebulization Q6H PRN Michael Litter, MD      . latanoprost (XALATAN) 0.005 % ophthalmic solution 1 drop  1 drop Both Eyes QHS Michael Litter, MD   1 drop at 07/30/16 2327  . levothyroxine (SYNTHROID, LEVOTHROID) tablet 137 mcg  137 mcg Oral QAC breakfast Michael Litter, MD   137 mcg at 07/31/16 0749  . losartan (COZAAR) tablet 50 mg  50 mg Oral Daily Michael Litter, MD   50 mg at 07/30/16 1122  . metroNIDAZOLE (FLAGYL) IVPB 500 mg  500 mg Intravenous Q8H Michael Litter, MD 100 mL/hr at 07/31/16 0145 500 mg at 07/31/16 0145  . multivitamin with minerals tablet 1 tablet  1 tablet Oral Daily Michael Litter, MD   1 tablet at 07/30/16 1123  . ondansetron (ZOFRAN) tablet 4 mg  4 mg Oral Q6H PRN Michael Litter, MD       Or  . ondansetron [facility]) injection 4 mg  4 mg Intravenous Q6H PRN Michael Litter, MD      . polyethylene glycol (MIRALAX / GLYCOLAX) packet 17 g  17 g Oral Daily PRN Michael Litter, MD      . polyvinyl alcohol (LIQUIFILM TEARS) 1.4 % ophthalmic solution 1 drop  1 drop Both Eyes TID AC & HS Michael Litter, MD   1 drop at 07/31/16 0751  . RESOURCE THICKENUP CLEAR 6 g  6 g Oral PRN Michael Litter, MD      . sertraline (ZOLOFT) tablet 100 mg  100 mg Oral Daily Michael Litter, MD   100 mg at 07/30/16 1125  .  sodium chloride flush (NS) 0.9 % injection 3 mL  3 mL Intravenous Q12H Michael Litter, MD   3 mL at 07/30/16 1134  . traMADol (ULTRAM) tablet 50 mg  50 mg Oral Q6H PRN Michael Litter, MD      . vancomycin (VANCOCIN) IVPB 1000 mg/200 mL premix  1,000 mg Intravenous Q24H Lynita Lombard [place], [facility]   Stopped at 07/31/16 1610  . Warfarin - Pharmacist Dosing Inpatient   Does not apply q1800 Stevphen Rochester, [facility]   Stopped at 07/29/16 1800   Outpatient Medications Prior to Visit  Medication Sig Dispense Refill  . acetaminophen (TYLENOL) 325 MG tablet Take 325-650 mg by mouth 2 (two) times daily as needed for mild pain.     [name] AMINO ACIDS-PROTEIN HYDROLYS PO Take 30 mLs by mouth 2 (two) times daily.    [name] amLODipine (NORVASC) 10 MG tablet TAKE 1 TABLET BY MOUTH DAILY 30 tablet 0  . atorvastatin (LIPITOR) 10 MG tablet TAKE ONE TABLET BY MOUTH ONCE DAILY 90 tablet 1  . b complex vitamins tablet Take 1 tablet by mouth daily.     . bisacodyl (DULCOLAX) 10 MG suppository Place 1 suppository (10 mg total) rectally daily as needed for moderate constipation. 12 suppository 0  . calcium-vitamin D (OSCAL WITH D) 500-200 MG-UNIT per tablet Take 1 tablet by mouth daily with breakfast.     . chlorhexidine (PERIDEX) 0.12 % solution Use as directed 15 mLs in the mouth or throat 2 (two) times daily.    . Chlorphen-PE-Acetaminophen (SINUS CONGESTION/PAIN DAY/NGHT PO) Take 1 capsule by mouth daily as needed (for sinus irritation).     [name] doxycycline (VIBRAMYCIN) 100 MG capsule TAKE ONE CAPSULE BY MOUTH TWICE A DAY FOR  10 DAYS  0  . feeding supplement, GLUCERNA SHAKE, (GLUCERNA SHAKE) LIQD Take 237 mLs by mouth daily.     . fluticasone (FLONASE) 50 MCG/ACT nasal spray Place 2 sprays into both nostrils daily. 16 g 3  . furosemide (LASIX) 20 MG tablet TAKE 1 TABLET BY MOUTH DAILY 30 tablet 11  . gabapentin (NEURONTIN) 300 MG capsule Take 300 mg by mouth 3 (three) times daily.     . Insulin Glargine (LANTUS SOLOSTAR) 100 UNIT/ML  Solostar Pen Inject 5 Units into the skin daily at 10 pm. 5 pen 6  . ipratropium-albuterol (DUONEB) 0.5-2.5 (3) MG/3ML SOLN Take 3 mLs by nebulization every 6 (six) hours as needed (sob). Use 3 times daily x 4 days then every 6 hours as needed. 360 mL 4  . isosorbide mononitrate (IMDUR) 30 MG 24 hr tablet Take 30 mg by mouth daily.    [name] latanoprost (XALATAN) 0.005 % ophthalmic solution Place 1 drop into both eyes at bedtime. 2.5 mL 12  . levalbuterol (XOPENEX HFA) 45 MCG/ACT inhaler Inhale 2 puffs into the lungs every 6 (six) hours as needed for wheezing or shortness of breath. 1 Inhaler 12  . levothyroxine (SYNTHROID, LEVOTHROID) 137 MCG tablet TAKE 1 TABLET (137 MCG TOTAL) BY MOUTH DAILY BEFORE BREAKFAST. 30 tablet 2  . losartan (COZAAR) 50 MG tablet Take 50 mg by mouth daily.  2  . Maltodextrin-Xanthan Gum (RESOURCE THICKENUP CLEAR) POWD Take 120 g by mouth as needed (use to thicken liquids.). 20 Can 0  . Multiple Vitamin (MULTIVITAMIN WITH MINERALS) TABS tablet Take 1 tablet by mouth daily.    . Nutritional Supplements (FEEDING SUPPLEMENT, OSMOLITE 1.5 CAL,) LIQD Place 1,000 mLs into feeding tube daily. 1000 mL 3  . Polyethyl Glycol-Propyl Glycol 0.4-0.3 % SOLN Place 1 drop into both eyes 4 (four) times daily.     . polyethylene glycol (MIRALAX / GLYCOLAX) packet Take 17 g by mouth daily as needed for mild constipation.    . RESTASIS 0.05 % ophthalmic emulsion USE 1 DROP INTO BOTH EYES TWICE DAILY 60 mL 3  . sertraline (ZOLOFT) 100 MG tablet TAKE ONE TABLET BY MOUTH ONCE DAILY FOR DEPRESSION 90 tablet 1  . traMADol (ULTRAM) 50 MG tablet Take 50 mg by mouth See admin instructions. Every 4-6 hours as needed for pain  0  . warfarin (COUMADIN) 2.5 MG tablet TAKE AS DIRECTED BY COUMADIN CLINIC. (Patient taking differently: Monday 2.5mg  and all other days 3.75mg ) 40 tablet 1  . Water For Irrigation, Sterile (FREE WATER) SOLN Place 230 mLs into feeding tube 3 (three) times daily.       Allergies:    Penicillins   Social History   Social History  . Marital status: Widowed    Spouse name: N/A  . Number of children: 3  . Years of education: N/A   Social History Main Topics  . Smoking status: Never Smoker  . Smokeless tobacco: Former Neurosurgeon    Types: Snuff     Comment: "used snuff in my younger days"  . Alcohol use No  . Drug use: No  . Sexual activity: No   Other Topics Concern  . Not on file   Social History Narrative   Married in [place], lives in one story house with 2 people and no pets   Occupation: ?   Has a living Will, [place], and doesn't have DNR   Was living with Husband (in frail health) in [place] until 09/2013.  After her [facility]  admission they both came to live with daughter in [place]     Family History:  The patient's ***family history includes Diabetes in her brother; Hypertension in her mother; Stroke in her mother.   ROS:   Please see the history of present illness.    ROS All other systems reviewed and are negative.   PHYSICAL EXAM:   VS:  There were no vitals taken for this visit.  Physical Exam  GEN: Well nourished, well developed, in no acute distress HEENT: normal Neck: no JVD, carotid bruits, or masses Cardiac:RRR; no murmurs, rubs, or gallops  Respiratory:  clear to auscultation bilaterally, normal work of breathing GI: soft, nontender, nondistended, + BS Ext: without cyanosis, clubbing, or edema, Good distal pulses bilaterally MS: no deformity or atrophy Skin: warm and dry, no rash Neuro:  Alert and Oriented x 3, Strength and sensation are intact Psych: euthymic mood, full affect  Wt Readings from Last 3 Encounters:  07/29/16 145 lb 1.6 oz (65.8 kg)  07/28/16 150 lb (68 kg)  07/28/16 149 lb 12.8 oz (67.9 kg)      Studies/Labs Reviewed:   EKG:  EKG is*** ordered today.  The ekg ordered today demonstrates ***  Recent Labs: 05/08/2016: NT-Pro BNP 1,132 06/08/2016: B Natriuretic Peptide 1,268.6 06/10/2016: Magnesium 2.1 06/18/2016: ALT  25 07/21/2016: TSH 4.97 07/29/2016: BUN 42; Creatinine, Ser 1.15; Potassium 4.5; Sodium 135 07/31/2016: Hemoglobin 8.9; Platelets 526   Lipid Panel    Component Value Date/Time   CHOL 126 04/07/2015 1015   TRIG 107 04/07/2015 1015   HDL 69 04/07/2015 1015   CHOLHDL 1.8 04/07/2015 1015   CHOLHDL 1.9 12/24/2013 0456   VLDL 26 12/24/2013 0456   LDLCALC 36 04/07/2015 1015    Additional studies/ records that were reviewed today include:  ***    ASSESSMENT:    No diagnosis found.   PLAN:  In order of problems listed above:      Medication Adjustments/Labs and Tests Ordered: Current medicines are reviewed at length with the patient today.  Concerns regarding medicines are outlined above.  Medication changes, Labs and Tests ordered today are listed in the Patient Instructions below. There are no Patient Instructions on file for this visit.   Signed, Jacolyn Reedy, PA-C  07/31/2016 8:16 AM    [facility] [address] [place], [place], [place]  [address] Phone: [phone]; Fax: [phone]

## 2016-07-31 NOTE — Consult Note (Signed)
[facility] Inpatient Consult   07/31/2016  Shannon [name] [birth year] [id number]  EMMI Calls only:  Patient active with EMMI HF calls with [facility] Care Management for follow up only with calls. Patient is a beneficiary of Medicare ACO. Patient receives her transition of care calls from [facility] ([facility]). Chart review reveals the patient is Shannon [name] is a 83 y.o. woman with multiple medical problems including HTN, HLD, Type 2 DM, prior CVA, chronic diastolic heart failure, gastric outlet obstruction S/P G-tube placement, hypothyroidism, symptomatic bradycardia (she declined PPM), CKD 3, GERD, COPD, and DVT who was referred to the ED from Dr. 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  The patient cannot tell me how long she has been having problems with this foot, but she reports acute changes in this foot on Tuesday.  She denies any trauma or acute injury. She has subacute pain in her left jaw. Met with the patient and her daughter [name] at the bedside.  [name], starts to laugh, when she sees me then states, "it wasn't her heart this time." Patient begins to laugh as well. "No not the heart this time."  Daughter states she was answering the EMMI HF calls but states, "the calls were annoying but we know it's only to keep up with her HF."  Daughter states that the patient's foot deteriorated pretty quickly and states that the talk of amputation is not an option. Patient states, "no, I'm not having that done."  Patient states she is not in a lot of pain.  Daughter states that the infection in is the bone she was told but she had not heard it officially from Dr. Waverly Ferrari. Spoke with the patient and daughter regarding Palliative Care for pain and symptom management.  Daughter states, "Mom will never be Hospice, we will consider Palliative if they are for her pain and symptoms.  I don't see that she will need anything  other than [facility] right now as far as visits go.   We have your phone number, I promise, on the refrigerator." Encouraged to call at anytime. Daughter states she has brochures and cards at home.  Explained that [facility] Care Management does not interfere with or replace any services needed.  They verbalized understanding.  Will accept EMMI calls but her primary care provider does the transition of care calls. Will follow up with their office, as well.  For questions, please contact:  Charlesetta Shanks, RN BSN CCM [facility]  [phone] business mobile phone Toll free office [phone]

## 2016-07-31 NOTE — Progress Notes (Signed)
[facility]  Patient Status: Active (receiving services up to time of hospitalization)  [facility] is providing the following services: RN, PT and MSW  If patient discharges after hours, please call [phone].   Shannon [name] 07/31/2016, 4:37 PM

## 2016-07-31 NOTE — Evaluation (Signed)
Physical Therapy Evaluation Patient Details Name: Shannon [name] MRN: [id number] DOB: Jun 26, 1923 Today's Date: 07/31/2016   History of Present Illness  83 yo female with PMH of hypothyroidism, symptomatic bradycardia (she declined PPM), CKD 3, GERD, COPD, and DVT who was referred to the ED from Dr. 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  Clinical Impression  Pt admitted with above diagnosis. Pt currently with functional limitations due to the deficits listed below (see PT Problem List). Pain is limiting ability to walk, but Ms. Komar uses the RW well to [place] painful foot; would recommend we restart HHPT and HHRN;  Pt will benefit from skilled PT to increase their independence and safety with mobility to allow discharge to the venue listed below.       Follow Up Recommendations Home health PTHHRN    Equipment Recommendations  3in1 (PT)    Recommendations for Other Services       Precautions / Restrictions Precautions Precautions: Fall      Mobility  Bed Mobility                  Transfers Overall transfer level: Needs assistance Equipment used: Rolling walker (2 wheeled) Transfers: Sit to/from Stand Sit to Stand: Min guard         General transfer comment: Minguard for safety; cues for hand placement; very dependent on UEs to push and for balance during transfer  Ambulation/Gait Ambulation/Gait assistance: Min guard (with and without physical contact) Ambulation Distance (Feet): 20 Feet Assistive device: Rolling walker (2 wheeled) Gait Pattern/deviations: Step-through pattern;Decreased step length - right;Decreased step length - left;Decreased stride length     General Gait Details: Cues to push down into RW to [employer] painful LLE  Stairs            Wheelchair Mobility    Modified Rankin (Stroke Patients Only)       Balance Overall balance assessment: Needs  assistance   Sitting balance-Leahy Scale: Good       Standing balance-Leahy Scale: Fair                               Pertinent Vitals/Pain Pain Assessment: Faces Faces Pain Scale: Hurts little more Pain Location: L foot Pain Descriptors / Indicators: Aching;Grimacing Pain Intervention(s): Monitored during session    Home Living Family/patient expects to be discharged to:: Private residence Living Arrangements: Children (with daughter) Available Help at Discharge: Family;Available 24 hours/day Type of Home: House Home Access: Ramped entrance     Home Layout: One level Home Equipment: Walker - 4 wheels;Cane - single point;Hand held Careers information officer - 2 wheels;Bedside commode Additional Comments: Aide comes to assist granddaughter with ADL's    Prior Function Level of Independence: Needs assistance   Gait / Transfers Assistance Needed: Uses rollator   ADL's / Homemaking Assistance Needed: Granddaughter assists with shower. Daughter assists with feeding tube, and does the housekeeping.        Hand Dominance   Dominant Hand: Right    Extremity/Trunk Assessment   Upper Extremity Assessment Upper Extremity Assessment: Generalized weakness    Lower Extremity Assessment Lower Extremity Assessment: Generalized weakness (noted wounds L foot)       Communication   Communication: No difficulties  Cognition Arousal/Alertness: Awake/alert Behavior During Therapy: WFL for tasks assessed/performed Overall Cognitive Status: Within Functional Limits for tasks assessed  General Comments General comments (skin integrity, edema, etc.): Wrapped L foot in Kerlix prior to walking    Exercises     Assessment/Plan    PT Assessment Patient needs continued PT services  PT Problem List Decreased strength;Decreased activity tolerance;Decreased balance;Decreased mobility;Decreased knowledge of use of  DME;Pain       PT Treatment Interventions DME instruction;Gait training;Stair training;Functional mobility training;Therapeutic activities;Therapeutic exercise;Balance training;Neuromuscular re-education;Cognitive remediation;Patient/family education    PT Goals (Current goals can be found in the Care Plan section)  Acute Rehab PT Goals Patient Stated Goal: Hopes to be home soon PT Goal Formulation: With patient Time For Goal Achievement: 08/14/16 Potential to Achieve Goals: Good    Frequency Min 3X/week   Barriers to discharge        Co-evaluation               AM-PAC PT "6 Clicks" Daily Activity  Outcome Measure Difficulty turning over in bed (including adjusting bedclothes, sheets and blankets)?: A Little Difficulty moving from lying on back to sitting on the side of the bed? : A Little Difficulty sitting down on and standing up from a chair with arms (e.g., wheelchair, bedside commode, etc,.)?: A Little Help needed moving to and from a bed to chair (including a wheelchair)?: A Little Help needed walking in hospital room?: A Little Help needed climbing 3-5 steps with a railing? : A Little 6 Click Score: 18    End of Session Equipment Utilized During Treatment: Gait belt Activity Tolerance: Patient tolerated treatment well Patient left: in chair;with call bell/phone within reach Nurse Communication: Mobility status PT Visit Diagnosis: Other abnormalities of gait and mobility (R26.89);Pain Pain - Right/Left: Left Pain - part of body:  (foot)    Time: 1610-9604 PT Time Calculation (min) (ACUTE ONLY): 26 min   Charges:   PT Evaluation $PT Eval Low Complexity: 1 Procedure PT Treatments $Gait Training: 8-22 mins   PT G Codes:        Van Clines, PT  Acute Rehabilitation Services Pager [phone] Office [phone]   Levi Aland 07/31/2016, 3:11 PM

## 2016-07-31 NOTE — Progress Notes (Signed)
[facility]  Patient Status: ACTIVE [place]  [facility] is providing the following services: RN PT  If patient discharges after hours, please call [phone].   [place], SW Navigator 07/31/2016, 1:35 PM  [phone]

## 2016-07-31 NOTE — Consult Note (Addendum)
WOC consult requested prior to Vascular service involvement. Pt has possible osteomyelitis according to the MRI results. They are now following for assessment and plan of care and have written orders for topical treatment; please refer to their team for further plan of care. Please re-consult if further assistance is needed.  Thank-you,  Julien Girt MSN, [place], [place], [place], [place]

## 2016-08-01 LAB — BASIC METABOLIC PANEL
Anion gap: 7 (ref 5–15)
BUN: 29 mg/dL — ABNORMAL HIGH (ref 6–20)
CO2: 24 mmol/L (ref 22–32)
Calcium: 9.3 mg/dL (ref 8.9–10.3)
Chloride: 100 mmol/L — ABNORMAL LOW (ref 101–111)
Creatinine, Ser: 1.01 mg/dL — ABNORMAL HIGH (ref 0.44–1.00)
GFR calc Af Amer: 55 mL/min — ABNORMAL LOW (ref >60)
GFR calc non Af Amer: 47 mL/min — ABNORMAL LOW (ref >60)
Glucose, Bld: 153 mg/dL — ABNORMAL HIGH (ref 65–99)
Potassium: 4.7 mmol/L (ref 3.5–5.1)
Sodium: 131 mmol/L — ABNORMAL LOW (ref 135–145)

## 2016-08-01 LAB — CBC
HCT: 30.7 % — ABNORMAL LOW (ref 36.0–46.0)
Hemoglobin: 10.2 g/dL — ABNORMAL LOW (ref 12.0–15.0)
MCH: 28.7 pg (ref 26.0–34.0)
MCHC: 33.2 g/dL (ref 30.0–36.0)
MCV: 86.5 fL (ref 78.0–100.0)
Platelets: 577 10*3/uL — ABNORMAL HIGH (ref 150–400)
RBC: 3.55 MIL/uL — ABNORMAL LOW (ref 3.87–5.11)
RDW: 18 % — ABNORMAL HIGH (ref 11.5–15.5)
WBC: 8.6 10*3/uL (ref 4.0–10.5)

## 2016-08-01 LAB — GLUCOSE, CAPILLARY
Glucose-Capillary: 172 mg/dL — ABNORMAL HIGH (ref 65–99)
Glucose-Capillary: 136 mg/dL — ABNORMAL HIGH (ref 65–99)
Glucose-Capillary: 181 mg/dL — ABNORMAL HIGH (ref 65–99)
Glucose-Capillary: 172 mg/dL — ABNORMAL HIGH (ref 65–99)

## 2016-08-01 LAB — PROTIME-INR
INR: 2.13
Prothrombin Time: 24.1 seconds — ABNORMAL HIGH (ref 11.4–15.2)

## 2016-08-01 MED ORDER — CLINDAMYCIN HCL 300 MG PO CAPS: 300.0000 mg | ORAL_CAPSULE | Freq: Three times a day (TID) | ORAL | 0 refills | Status: DC

## 2016-08-01 MED ORDER — WARFARIN SODIUM 4 MG PO TABS
4.0000 mg | ORAL_TABLET | Freq: Once | ORAL | Status: AC
  Administered 2016-08-01: 4 mg via ORAL
  Filled 2016-08-01 (×2): qty 1

## 2016-08-01 NOTE — Progress Notes (Signed)
Physical Therapy Treatment Patient Details Name: Shannon [name] MRN: [id number] DOB: [birth year] Today's Date: 08/01/2016    History of Present Illness 83 yo female with PMH of hypothyroidism, symptomatic bradycardia (she declined PPM), CKD 3, GERD, COPD, and DVT who was referred to the ED from Dr. 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    PT Comments    Continuing work on functional mobility and activity tolerance;  Able to incr amb distance, and indicates she is in less pain today; discussed with Mathis Fare, RN, who posed that a hard sole post op shoe may help with pain and protection of L foot/toes; I completely agree -- paged Dr. Mahala Menghini for the order   Follow Up Recommendations  Home health PT     Equipment Recommendations  3in1 (PT)    Recommendations for Other Services       Precautions / Restrictions Precautions Precautions: Fall    Mobility  Bed Mobility                  Transfers Overall transfer level: Needs assistance Equipment used: Rolling walker (2 wheeled) Transfers: Sit to/from Stand Sit to Stand: Min guard         General transfer comment: Minguard for safety; cues for hand placement; very dependent on UEs to push and for balance during transfer  Ambulation/Gait Ambulation/Gait assistance: Min guard (without physical contact) Ambulation Distance (Feet): 65 Feet Assistive device: Rolling walker (2 wheeled) Gait Pattern/deviations: Step-through pattern;Decreased step length - right;Decreased step length - left;Decreased stride length     General Gait Details: Cues to push down into RW to [employer] painful LLE   Stairs            Wheelchair Mobility    Modified Rankin (Stroke Patients Only)       Balance                                            Cognition Arousal/Alertness: Awake/alert Behavior During Therapy: WFL for tasks  assessed/performed Overall Cognitive Status: Within Functional Limits for tasks assessed                                        Exercises      General Comments        Pertinent Vitals/Pain Pain Assessment: Faces Faces Pain Scale: Hurts a little bit Pain Location: L foot Pain Descriptors / Indicators: Aching;Grimacing Pain Intervention(s): Monitored during session    Home Living                      Prior Function            PT Goals (current goals can now be found in the care plan section) Acute Rehab PT Goals Patient Stated Goal: Hopes to be home soon PT Goal Formulation: With patient Time For Goal Achievement: 08/14/16 Potential to Achieve Goals: Good Progress towards PT goals: Progressing toward goals    Frequency    Min 3X/week      PT Plan Current plan remains appropriate    Co-evaluation              AM-PAC PT "6 Clicks" Daily Activity  Outcome Measure  Difficulty turning over in bed (including adjusting bedclothes, sheets and blankets)?: None Difficulty moving from lying on back to sitting on the side of the bed? : None Difficulty sitting down on and standing up from a chair with arms (e.g., wheelchair, bedside commode, etc,.)?: A Little Help needed moving to and from a bed to chair (including a wheelchair)?: None Help needed walking in hospital room?: None Help needed climbing 3-5 steps with a railing? : A Little 6 Click Score: 22    End of Session Equipment Utilized During Treatment: Gait belt Activity Tolerance: Patient tolerated treatment well Patient left: in chair;with call bell/phone within reach Nurse Communication: Mobility status PT Visit Diagnosis: Other abnormalities of gait and mobility (R26.89);Pain Pain - Right/Left: Left Pain - part of body:  (foot and toes)     Time: 2536-6440 PT Time Calculation (min) (ACUTE ONLY): 14 min  Charges:  $Gait Training: 8-22 mins                    G Codes:        Van Clines, PT  Acute Rehabilitation Services Pager [phone] Office [phone]    Levi Aland 08/01/2016, 11:41 AM

## 2016-08-01 NOTE — Care Management Note (Signed)
Case Management Note  Patient Details  Name: Shannon [name] MRN: [id number] Date of Birth: 1923/07/21  Subjective/Objective:        CM following for progression and d/c planning.             Action/Plan: Pt active with [facility] at [facility] level for [facility], HHPT and Education officer, museum. Did not discuss with pt or daughter as this is in place at this time and per hx, they may decline HH or support and any time for unknown reasons therefore no discussion with this CM. Will continue the services that they are getting now and hope that they will continue to accept this support in an effort to limit frequent readmission.  Per history, discussions with the daughter may result in cancellation of services.   Expected Discharge Date:  08/01/16               Expected Discharge Plan:  [place]  In-House Referral:  NA  Discharge planning Services  CM Consult  Post Acute Care Choice:  Home Health Choice offered to:  Adult Children  DME Arranged:  N/A DME Agency:  NA  HH Arranged:  RN, PT, Social Work [employer] Agency:  [place]  Status of Service:  Completed, signed off  If discussed at [employer], dates discussed:    Additional Comments:  Adron Bene, RN 08/01/2016, 12:06 PM

## 2016-08-01 NOTE — Discharge Summary (Signed)
Physician Discharge Summary  Shannon [name] ZOX:[id number] DOB: 08-24-1923 DOA: 07/28/2016  PCP: Kirt Boys, DO  Admit date: 07/28/2016 Discharge date: 08/01/2016  Time spent: 30 minutes  Recommendations for Outpatient Follow-up:  1. Complete clinda 300 po tid for 1 mo 2. She has a very poor prognosis if her leg contunueds to look infected-she is DNR but is not yet willing to discuss palliative care 3. Need cbc and cmet as OP 4. Should follow with either PCP ior vasc for wound checks--wash with dial soap and place 2 x2 in between toes  Discharge Diagnoses:  Principal Problem:   Diabetic foot ulcer (HCC) Active Problems:   Essential hypertension, benign   GERD (gastroesophageal reflux disease)   Warfarin anticoagulation   Atherosclerosis of native arteries of the extremities with gangrene (HCC)   Gangrene (HCC)   Discharge Condition: gaurded  Diet recommendation: hh low salt  Filed Weights   07/28/16 1616 07/29/16 0551 07/29/16 2017  Weight: 65.3 kg (144 lb) 64.8 kg (142 lb 14.4 oz) 65.8 kg (145 lb 1.6 oz)    History of present illness:  83 fem CKD III Afib CHad2vasc2 = 6 + Tachybradysyndrome COPD DVT in past CVA Chr L Cerebellear infracts HTN ty II DM GO Obstrution s/p G tube HLD GERD Diastolic HF hypothryoid Pressure ulcer  This is her 6th or th admission for various issues in the past 6 mo She has been offered pallaitive care in the past as her condition is the sum of her mu;tiple issues  She presented from Vascular surgery office with gangrene-it is felt that she has few options other than amputation left to consider She is unwilling to undergo amoputation and wishes to try medication only   Hospital Course:  Gangrene and osteomyelitis LLE 2nd toe Not really considering amputaiton Cont vanc and cefepime + flagyll--trasntiion to PO clinda for 3-4 weeks and review as an OP We will ask home health Rn and Pt to see patient as OP  Afib chadvasc=6 Cont  coumadin per pharm D-INR 2.1 and therapetuic Not on rt control presumably 2/2 to tachy-brady  Prior CVA  on coumadin INR therapeutic-cont Coumadin 5  stg III CKD At around usual baseline Monitor periodically as OP  COPD No wheeze.  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 Studies not automatically included, echos, thoracentesis, etc; not x-rays)  Consultations:  vanc and flagyll 4/27-->4/30  Clindamycin 4/30---->5/30  Discharge Exam: Vitals:   07/31/16 2022 08/01/16 0410  BP: (!) 156/61 (!) 147/58  Pulse: 81 78  Resp: 18 16  Temp: 98.4 F (36.9 C) 98.6 F (37 C)    General: alert oriente din nad Cardiovascular: s1 s 2no m/r/g Respiratory: cta b Skin shows some gangrene on toes.  No overt oozing  Discharge Instructions    Current Discharge Medication List    CONTINUE these medications which have CHANGED   Details  levothyroxine (SYNTHROID, LEVOTHROID) 137 MCG tablet TAKE 1 TABLET (137 MCG TOTAL) BY MOUTH DAILY BEFORE BREAKFAST. Qty: 30 tablet, Refills: 2      CONTINUE these medications which have NOT CHANGED   Details  acetaminophen (TYLENOL) 325 MG tablet Take 325-650 mg by mouth 2 (two) times daily  as needed for mild pain.     AMINO ACIDS-PROTEIN HYDROLYS PO Take 30 mLs by mouth 2 (two) times daily.    amLODipine (NORVASC) 10 MG tablet TAKE 1 TABLET BY MOUTH DAILY Qty: 30 tablet, Refills: 0    atorvastatin (LIPITOR) 10 MG tablet TAKE ONE TABLET BY MOUTH ONCE DAILY Qty: 90 tablet, Refills: 1    b complex vitamins tablet Take 1 tablet by mouth daily.     bisacodyl (DULCOLAX) 10 MG suppository Place 1 suppository (10 mg total) rectally daily as  needed for moderate constipation. Qty: 12 suppository, Refills: 0    calcium-vitamin D (OSCAL WITH D) 500-200 MG-UNIT per tablet Take 1 tablet by mouth daily with breakfast.     chlorhexidine (PERIDEX) 0.12 % solution Use as directed 15 mLs in the mouth or throat 2 (two) times daily.    Chlorphen-PE-Acetaminophen (SINUS CONGESTION/PAIN DAY/NGHT PO) Take 1 capsule by mouth daily as needed (for sinus irritation).     doxycycline (VIBRAMYCIN) 100 MG capsule TAKE ONE CAPSULE BY MOUTH TWICE A DAY FOR 10 DAYS Refills: 0    !! feeding supplement, GLUCERNA SHAKE, (GLUCERNA SHAKE) LIQD Take 237 mLs by mouth daily.     fluticasone (FLONASE) 50 MCG/ACT nasal spray Place 2 sprays into both nostrils daily. Qty: 16 g, Refills: 3    furosemide (LASIX) 20 MG tablet TAKE 1 TABLET BY MOUTH DAILY Qty: 30 tablet, Refills: 11    gabapentin (NEURONTIN) 300 MG capsule Take 300 mg by mouth 3 (three) times daily.     Insulin Glargine (LANTUS SOLOSTAR) 100 UNIT/ML Solostar Pen Inject 5 Units into the skin daily at 10 pm. Qty: 5 pen, Refills: 6   Associated Diagnoses: Type 2 diabetes mellitus with stage 3 chronic kidney disease, with long-term current use of insulin (HCC)    ipratropium-albuterol (DUONEB) 0.5-2.5 (3) MG/3ML SOLN Take 3 mLs by nebulization every 6 (six) hours as needed (sob). Use 3 times daily x 4 days then every 6 hours as needed. Qty: 360 mL, Refills: 4    isosorbide mononitrate (IMDUR) 30 MG 24 hr tablet Take 30 mg by mouth daily.    latanoprost (XALATAN) 0.005 % ophthalmic solution Place 1 drop into both eyes at bedtime. Qty: 2.5 mL, Refills: 12    levalbuterol (XOPENEX HFA) 45 MCG/ACT inhaler Inhale 2 puffs into the lungs every 6 (six) hours as needed for wheezing or shortness of breath. Qty: 1 Inhaler, Refills: 12    losartan (COZAAR) 50 MG tablet Take 50 mg by mouth daily. Refills: 2    Maltodextrin-Xanthan Gum (RESOURCE THICKENUP CLEAR) POWD Take 120 g by mouth as needed (use to  thicken liquids.). Qty: 20 Can, Refills: 0    Multiple Vitamin (MULTIVITAMIN WITH MINERALS) TABS tablet Take 1 tablet by mouth daily.    !! Nutritional Supplements (FEEDING SUPPLEMENT, OSMOLITE 1.5 CAL,) LIQD Place 1,000 mLs into feeding tube daily. Qty: 1000 mL, Refills: 3    Polyethyl Glycol-Propyl Glycol 0.4-0.3 % SOLN Place 1 drop into both eyes 4 (four) times daily.     polyethylene glycol (MIRALAX / GLYCOLAX) packet Take 17 g by mouth daily as needed for mild constipation.    RESTASIS 0.05 % ophthalmic emulsion USE 1 DROP INTO BOTH EYES TWICE DAILY Qty: 60 mL, Refills: 3    sertraline (ZOLOFT) 100 MG tablet TAKE ONE TABLET BY MOUTH ONCE DAILY FOR DEPRESSION Qty: 90 tablet, Refills: 1    traMADol (ULTRAM) 50 MG tablet Take 50 mg by mouth See admin instructions.  Every 4-6 hours as needed for pain Refills: 0    warfarin (COUMADIN) 2.5 MG tablet TAKE AS DIRECTED BY COUMADIN CLINIC. Qty: 40 tablet, Refills: 1    Water For Irrigation, Sterile (FREE WATER) SOLN Place 230 mLs into feeding tube 3 (three) times daily.     !! - Potential duplicate medications found. Please discuss with provider.    STOP taking these medications     bacitracin-polymyxin b (POLYSPORIN) ophthalmic ointment        Allergies  Allergen Reactions  . Penicillins Itching, Rash and Other (See Comments)    Tolerated unasyn, zosyn & cephalosporins in the past.  Has patient had a PCN reaction causing immediate rash, facial/tongue/throat swelling, SOB or lightheadedness with hypotension: Yes Has patient had a PCN reaction causing severe rash involving mucus membranes or skin necrosis: Unk Has patient had a PCN reaction that required hospitalization: Unk Has patient had a PCN reaction occurring within the last 10 years: Unk If all of the above answers are "NO", then may proceed with Cephalosporins      The results of significant diagnostics from this hospitalization (including imaging, microbiology,  ancillary and laboratory) are listed below for reference.    Significant Diagnostic Studies: Mr Foot Left Wo Contrast  Result Date: 07/29/2016 CLINICAL DATA:  Diabetic left foot wound.  Second toe appears block. EXAM: MRI OF THE LEFT FOOT WITHOUT CONTRAST TECHNIQUE: Multiplanar, multisequence MR imaging of the left forefoot was performed. No intravenous contrast was administered. COMPARISON:  None. FINDINGS: Bones/Joint/Cartilage Marrow signal abnormality consistent with bone marrow edema is identified involving the base of the second proximal phalanx. This overlies an area of plantar soft tissue cellulitis with ulceration. Despite lack of IV contrast there is suspicion for a subcutaneous soft tissue abscess along the plantar aspect of the left second toe measuring 6 x 3 x 7 mm characterized by irregular hypointensity within the area of inflamed soft tissue. Similar plantar soft tissue edema and/or cellulitis is seen along the plantar aspect of the fourth and fifth digits. Hallux valgus with osteoarthritic joint space narrowing is noted of the great toe. DIP and PIP joint space narrowing as well as interphalangeal joint space narrowing of the great toe are identified. Ligaments Noncontributory Muscles and Tendons No intramuscular hemorrhage, hematoma or atrophy. The extensor and flexor tendons of the forefoot are grossly intact without sickle-cell abnormalities noted. Soft tissues Diffuse soft tissue swelling/cellulitis of the forefoot especially along the dorsum. IMPRESSION: 1. Marrow signal abnormality involving the base of the second proximal phalanx overlying an area of soft tissue ulceration, inflammation and possible tiny subcutaneous abscess measuring 6 x 3 x 7 mm. This raises concern for osteomyelitis though specificity is somewhat limited by lack of IV contrast. 2. Plantar soft tissue inflammation also noted along the base of the fourth and fifth digits without adjacent marrow signal abnormalities. 3.  Hallux valgus with osteoarthritic joint space narrowing of the great toe. Lesser degree of the joint space narrowing is identified of the PIP and DIP joints of the second through fifth digits. Electronically Signed   By: Tollie Eth M.D.   On: 07/29/2016 01:16   Dg Foot Complete Left  Result Date: 07/28/2016 CLINICAL DATA:  83 y/o F; increasing foot pain in the plantar and anterior site of the foot. EXAM: LEFT FOOT - COMPLETE 3+ VIEW COMPARISON:  08/26/2014 foot radiographs FINDINGS: Bones are demineralized. Hallux valgus. Lisfranc alignment is maintained. Pes planus. No acute fracture identified. Plantar calcaneal enthesophyte. Vascular calcifications. IMPRESSION: No  acute fracture identified. Hallux valgus. Pes planus. Plantar calcaneal enthesophyte. Electronically Signed   By: Mitzi Hansen M.D.   On: 07/28/2016 21:10    Microbiology: Recent Results (from the past 240 hour(s))  Culture, blood (routine x 2)     Status: None (Preliminary result)   Collection Time: 07/29/16 12:50 AM  Result Value Ref Range Status   Specimen Description BLOOD LEFT HAND  Final   Special Requests   Final    BOTTLES DRAWN AEROBIC ONLY Blood Culture adequate volume   Culture NO GROWTH 3 DAYS  Final   Report Status PENDING  Incomplete  Culture, blood (routine x 2)     Status: None (Preliminary result)   Collection Time: 07/29/16 12:58 AM  Result Value Ref Range Status   Specimen Description BLOOD RIGHT HAND  Final   Special Requests   Final    BOTTLES DRAWN AEROBIC ONLY Blood Culture adequate volume   Culture NO GROWTH 3 DAYS  Final   Report Status PENDING  Incomplete  MRSA PCR Screening     Status: None   Collection Time: 07/29/16  2:47 AM  Result Value Ref Range Status   MRSA by PCR NEGATIVE NEGATIVE Final    Comment:        The GeneXpert MRSA Assay (FDA approved for NASAL specimens only), is one component of a comprehensive MRSA colonization surveillance program. It is not intended to  diagnose MRSA infection nor to guide or monitor treatment for MRSA infections.      Labs: Basic Metabolic Panel:  Recent Labs Lab 07/28/16 2039 07/29/16 0041 08/01/16 0528  NA 136 135 131*  K 4.2 4.5 4.7  CL 102 102 100*  CO2  --  23 24  GLUCOSE 101* 89 153*  BUN 43* 42* 29*  CREATININE 1.30* 1.15* 1.01*  CALCIUM  --  9.8 9.3   Liver Function Tests: No results for input(s): AST, ALT, ALKPHOS, BILITOT, PROT, ALBUMIN in the last 168 hours. No results for input(s): LIPASE, AMYLASE in the last 168 hours. No results for input(s): AMMONIA in the last 168 hours. CBC:  Recent Labs Lab 07/28/16 2032 07/28/16 2039 07/29/16 0041 07/31/16 0452 08/01/16 0528  WBC 9.1  --  11.5* 8.6 8.6  NEUTROABS 6.4  --   --   --   --   HGB 8.9* 9.5* 7.9* 8.9* 10.2*  HCT 26.0* 28.0* 23.4* 26.9* 30.7*  MCV 85.5  --  86.0 86.2 86.5  PLT 530*  --  591* 526* 577*   Cardiac Enzymes: No results for input(s): CKTOTAL, CKMB, CKMBINDEX, TROPONINI in the last 168 hours. BNP: BNP (last 3 results)  Recent Labs  05/08/16 1635 05/22/16 0102 06/08/16 1523  BNP 80.9 271.0* 1,268.6*    ProBNP (last 3 results)  Recent Labs  05/08/16 1053  PROBNP 1,132*    CBG:  Recent Labs Lab 07/31/16 1148 07/31/16 1636 07/31/16 2105 08/01/16 0408 08/01/16 0730  GLUCAP 137* 138* 197* 172* 136*       Signed:  Rhetta Mura MD   [facility] 08/01/2016, 9:56 AM

## 2016-08-01 NOTE — Progress Notes (Addendum)
[facility] for Warfarin  Indication: atrial fibrillation and DVT  Patient Measurements: Height: 5\' 6"  (167.6 cm) Weight: 145 lb 1.6 oz (65.8 kg) IBW/kg (Calculated) : 59.3  Vital Signs: Temp: 98.6 F (37 C) (05/01 0410) Temp Source: Oral (05/01 0410) BP: 147/58 (05/01 0410) Pulse Rate: 78 (05/01 0410)  Labs:  Recent Labs  07/30/16 0458 07/31/16 0452 08/01/16 0528  HGB  --  8.9* 10.2*  HCT  --  26.9* 30.7*  PLT  --  526* 577*  LABPROT 25.5* 24.4* 24.1*  INR 2.28 2.16 2.13  CREATININE  --   --  1.01*    Estimated Creatinine Clearance: 34 mL/min (A) (by C-G formula based on SCr of 1.01 mg/dL (H)).  Assessment:  83 y/o F on warfarin PTA, here for foot/leg pain found to have gangrene left foot with PVD. Warfarin held 4/28 awaiting family decision for vascular intervention - decision was made not to pursue amputation or arteriography per [facility] note.  INR is therapeutic at 2.13 this morning. Hgb improved to 10.2, platelets remain elevated- no bleeding noted.  Warfarin PTA dosing: 2.5 mg on mondays, 3.75 mg all other days  Goal of Therapy:  INR 2-3 Monitor platelets by anticoagulation protocol: Yes   Plan:  - Warfarin 4mg  PO tonight if still in the hospital (order entered) - If patient is discharged, can restart home dose of 3.75mg  except 2.5mg  on Mondays with an INR check on Thursday 5/3 or Friday 5/4 - Daily INR while in the hospital - Monitor for s/sx of bleeding - Watch for interaction with clindamycin PO  Jerron Niblack D. Jazae Gandolfi, PharmD, BCPS Clinical Pharmacist Pager: [phone] 08/01/2016 9:12 AM

## 2016-08-01 NOTE — Care Management Important Message (Signed)
Important Message  Patient Details  Name: Shannon [name] MRN: [id number] Date of Birth: [birth year]   Medicare Important Message Given:  Yes    [name] 08/01/2016, 1:43 PM

## 2016-08-01 NOTE — Progress Notes (Signed)
Orthopedic Tech Progress Note Patient Details:  Shannon [name] 07-21-1923 [id number]  Ortho Devices Type of Ortho Device: Postop shoe/boot Ortho Device/Splint Location: LLE Ortho Device/Splint Interventions: Application, Ordered   Braulio Bosch 08/01/2016, 3:45 PM

## 2016-08-02 ENCOUNTER — Ambulatory Visit (INDEPENDENT_AMBULATORY_CARE_PROVIDER_SITE_OTHER): Payer: Medicare Other | Admitting: Pharmacist

## 2016-08-02 ENCOUNTER — Telehealth: Payer: Self-pay | Admitting: Cardiology

## 2016-08-02 DIAGNOSIS — E1122 Type 2 diabetes mellitus with diabetic chronic kidney disease: Secondary | ICD-10-CM | POA: Diagnosis not present

## 2016-08-02 DIAGNOSIS — I4892 Unspecified atrial flutter: Secondary | ICD-10-CM

## 2016-08-02 DIAGNOSIS — I2510 Atherosclerotic heart disease of native coronary artery without angina pectoris: Secondary | ICD-10-CM | POA: Diagnosis not present

## 2016-08-02 DIAGNOSIS — I824Y2 Acute embolism and thrombosis of unspecified deep veins of left proximal lower extremity: Secondary | ICD-10-CM

## 2016-08-02 DIAGNOSIS — Z5181 Encounter for therapeutic drug level monitoring: Secondary | ICD-10-CM

## 2016-08-02 DIAGNOSIS — I130 Hypertensive heart and chronic kidney disease with heart failure and stage 1 through stage 4 chronic kidney disease, or unspecified chronic kidney disease: Secondary | ICD-10-CM | POA: Diagnosis not present

## 2016-08-02 DIAGNOSIS — I5033 Acute on chronic diastolic (congestive) heart failure: Secondary | ICD-10-CM | POA: Diagnosis not present

## 2016-08-02 DIAGNOSIS — J449 Chronic obstructive pulmonary disease, unspecified: Secondary | ICD-10-CM | POA: Diagnosis not present

## 2016-08-02 DIAGNOSIS — J690 Pneumonitis due to inhalation of food and vomit: Secondary | ICD-10-CM | POA: Diagnosis not present

## 2016-08-02 LAB — POCT INR: INR: 3

## 2016-08-02 NOTE — Telephone Encounter (Signed)
Pts Daughter calling to report to Dr Meda Coffee that the pt was just discharged from the hospital for non-cardiac related issues.  Daughter reports that the pts lasix was discontinued at discharge, and was advised to hold for now.  Daughter states that the pt is scheduled to see her PCP for hospital follow-up on next Monday 5/7 and follow-up with our office on 5/15.  Daughter wants to ask Dr Meda Coffee should she start her back on her lasix regimen, or should this be held and addressed by her PCP on 5/7, or by [place] on 5/15.  Informed the daughter that I will route this message to Dr Meda Coffee to review discharge note from the hospital and advise on lasix regimen.  Informed the daughter that I will follow-up with her shortly thereafter, once recommendations are provided.  Daughter verbalized understanding and agrees with this plan.

## 2016-08-02 NOTE — Telephone Encounter (Signed)
New message   Pt off of lasix after visit to the hospital. Pt daughter wants to know if its ok to be off Lasix.

## 2016-08-03 ENCOUNTER — Telehealth: Payer: Self-pay

## 2016-08-03 LAB — CULTURE, BLOOD (ROUTINE X 2)
Culture: NO GROWTH
Culture: NO GROWTH
Special Requests: ADEQUATE
Special Requests: ADEQUATE

## 2016-08-03 NOTE — Telephone Encounter (Addendum)
I have made the 1st attempt to contact the patient or family member in charge, in order to follow up from recently being discharged from the hospital. Pt did not have a voicemail box but I will make another attempt at a different time.    Transition Care Management Follow-Up Telephone Call   Date discharged and where: [facility] on 08/01/2016  How have you been since you were released from the hospital? Concerned about wound care. I went over it with her but she may have more questions during her F/U visit.  Any patient concerns?  Lasix was discontinued and pt is not urinating much  Items Reviewed:   Meds:  Y  Allergies:Y  Dietary Changes Reviewed: Y  Functional Questionnaire:  Independent-I Dependent-D  ADLs:   Dressing- I    Eating- I   Maintaining continence- I   Transferring- I w/ walker   Transportation- D   Meal Prep- D   Managing Meds- D   Confirmed importance and Date/Time of follow-up visits scheduled: Yes. Sherrie Mustache, NP 08/07/2016 @ 1pm   Confirmed with patient if condition worsens to call PCP or go to the Emergency Dept. Patient was given office number and encouraged to call back with questions or concerns: Yes

## 2016-08-03 NOTE — Telephone Encounter (Signed)
please schedule her with one of our PA tomorrow or next week.

## 2016-08-03 NOTE — Telephone Encounter (Signed)
Spoke with the pts daughter and offered to move the pts appt up to tomorrow or next week with a PA, and Daughter's schedule conflicted with appts offered.  Daughter is unable to bring the pt in tomorrow or next week.  Spoke with Dr Meda Coffee about this and she endorsed that the pt should keep her follow-up appt with Sharyn Lull as scheduled for 5/17, and go and see her PCP as scheduled for next Monday.  Informed the pts Daughter that Dr Meda Coffee would like for her to speak with her PCP Sherrie Mustache, about possibly restarting her lasix, if acceptable, based on her assessment.  Daughter verbalized understanding and agrees with this plan.

## 2016-08-03 NOTE — Telephone Encounter (Signed)
Sure, Wound Care consult to Dr. Con Memos or Dellia Nims. Thanks, Dr. Amalia Hailey

## 2016-08-07 ENCOUNTER — Encounter: Payer: Self-pay | Admitting: Nurse Practitioner

## 2016-08-07 ENCOUNTER — Ambulatory Visit (INDEPENDENT_AMBULATORY_CARE_PROVIDER_SITE_OTHER): Payer: Medicare Other | Admitting: Nurse Practitioner

## 2016-08-07 VITALS — BP 134/64 | HR 69 | Temp 97.8°F | Resp 18 | Ht 66.0 in | Wt 151.4 lb

## 2016-08-07 DIAGNOSIS — L97513 Non-pressure chronic ulcer of other part of right foot with necrosis of muscle: Secondary | ICD-10-CM

## 2016-08-07 DIAGNOSIS — I96 Gangrene, not elsewhere classified: Secondary | ICD-10-CM

## 2016-08-07 DIAGNOSIS — M86179 Other acute osteomyelitis, unspecified ankle and foot: Secondary | ICD-10-CM | POA: Diagnosis not present

## 2016-08-07 DIAGNOSIS — R197 Diarrhea, unspecified: Secondary | ICD-10-CM | POA: Diagnosis not present

## 2016-08-07 DIAGNOSIS — E11621 Type 2 diabetes mellitus with foot ulcer: Secondary | ICD-10-CM

## 2016-08-07 DIAGNOSIS — I5032 Chronic diastolic (congestive) heart failure: Secondary | ICD-10-CM

## 2016-08-07 DIAGNOSIS — E1142 Type 2 diabetes mellitus with diabetic polyneuropathy: Secondary | ICD-10-CM

## 2016-08-07 DIAGNOSIS — I10 Essential (primary) hypertension: Secondary | ICD-10-CM

## 2016-08-07 DIAGNOSIS — I70262 Atherosclerosis of native arteries of extremities with gangrene, left leg: Secondary | ICD-10-CM

## 2016-08-07 LAB — CBC WITH DIFFERENTIAL/PLATELET
Basophils Absolute: 83 cells/uL (ref 0–200)
Basophils Relative: 1 %
Eosinophils Absolute: 913 cells/uL — ABNORMAL HIGH (ref 15–500)
Eosinophils Relative: 11 %
HCT: 27.3 % — ABNORMAL LOW (ref 35.0–45.0)
Hemoglobin: 8.6 g/dL — ABNORMAL LOW (ref 11.7–15.5)
Lymphocytes Relative: 19 %
Lymphs Abs: 1577 cells/uL (ref 850–3900)
MCH: 28.5 pg (ref 27.0–33.0)
MCHC: 31.5 g/dL — ABNORMAL LOW (ref 32.0–36.0)
MCV: 90.4 fL (ref 80.0–100.0)
MPV: 9.6 fL (ref 7.5–12.5)
Monocytes Absolute: 1079 cells/uL — ABNORMAL HIGH (ref 200–950)
Monocytes Relative: 13 %
Neutro Abs: 4648 cells/uL (ref 1500–7800)
Neutrophils Relative %: 56 %
Platelets: 495 10*3/uL — ABNORMAL HIGH (ref 140–400)
RBC: 3.02 MIL/uL — ABNORMAL LOW (ref 3.80–5.10)
RDW: 17.8 % — ABNORMAL HIGH (ref 11.0–15.0)
WBC: 8.3 10*3/uL (ref 3.8–10.8)

## 2016-08-07 LAB — BASIC METABOLIC PANEL WITH GFR
BUN: 58 mg/dL — ABNORMAL HIGH (ref 7–25)
CO2: 25 mmol/L (ref 20–31)
Calcium: 9.2 mg/dL (ref 8.6–10.4)
Chloride: 100 mmol/L (ref 98–110)
Creat: 1.2 mg/dL — ABNORMAL HIGH (ref 0.60–0.88)
GFR, Est African American: 46 mL/min — ABNORMAL LOW (ref >=60)
GFR, Est Non African American: 40 mL/min — ABNORMAL LOW (ref >=60)
Glucose, Bld: 170 mg/dL — ABNORMAL HIGH (ref 65–99)
Potassium: 5.3 mmol/L (ref 3.5–5.3)
Sodium: 133 mmol/L — ABNORMAL LOW (ref 135–146)

## 2016-08-07 IMAGING — CT CT ABD-PELV W/ CM
1 of 3 series · 13 of 32 positions shown, 17 images · IV contrast (OMNIPAQUE 300)
Comparison: CT of the abdomen and pelvis 01/17/2014.

CLINICAL DATA: 89-year-old female presenting with rectal pressure
gradually worsening over the prior month.

EXAM:
CT ABDOMEN AND PELVIS WITH CONTRAST
TECHNIQUE: Multidetector CT imaging of the abdomen and pelvis was performed
using the standard protocol following bolus administration of
intravenous contrast.
CONTRAST:  50mL OMNIPAQUE IOHEXOL 300 MG/ML SOLN, 80mL OMNIPAQUE
IOHEXOL 300 MG/ML SOLN

[Series 2: abd/pel with · axial · 0.69mm/px · z∈[-447,-42]mm · 13 of 91 slices shown, 17 images]
[im 5/91  soft-tissue]
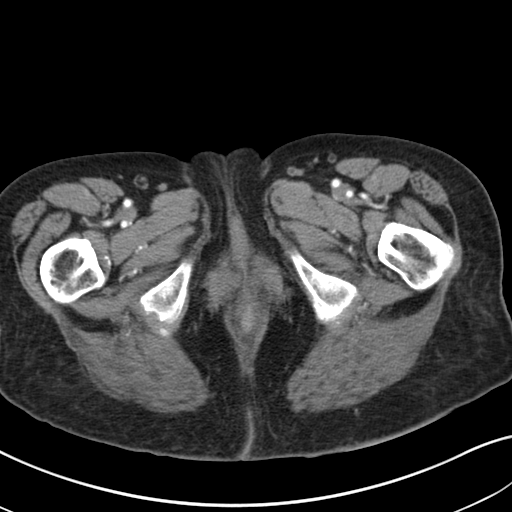
[im 5/91  bone]
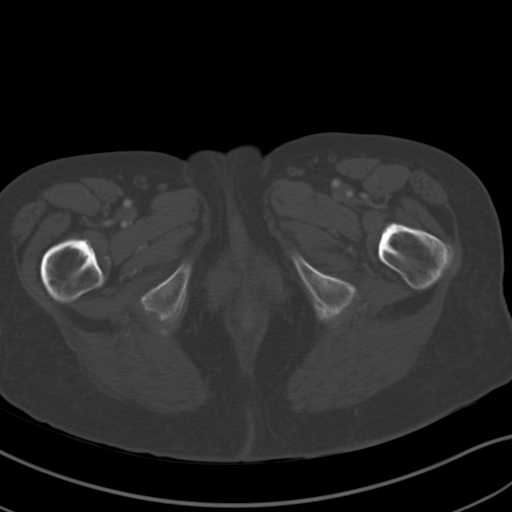
[im 15/91  soft-tissue]
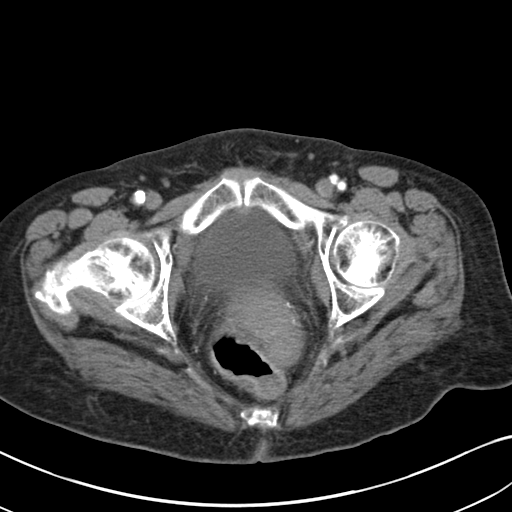
[im 24/91  soft-tissue]
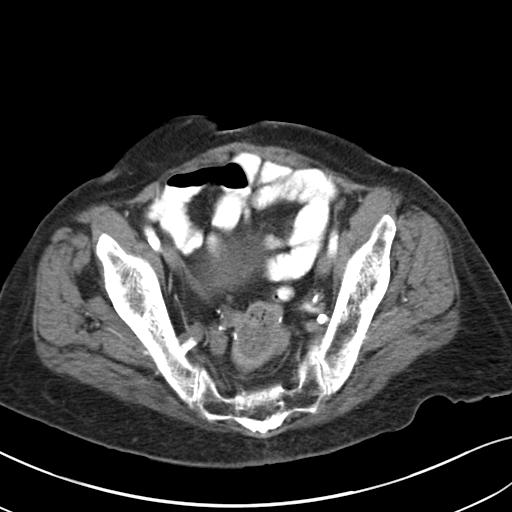
[im 29/91  soft-tissue]
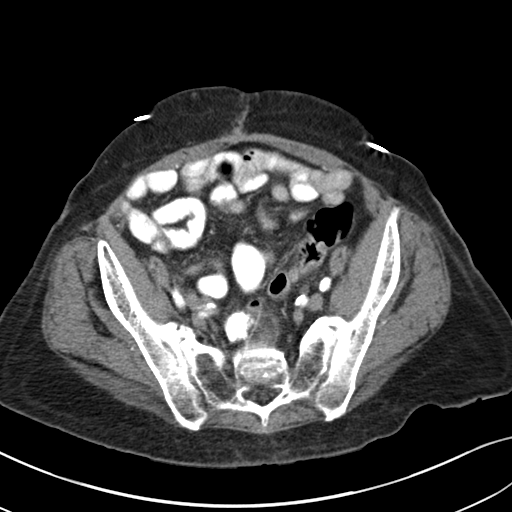
[im 38/91  soft-tissue]
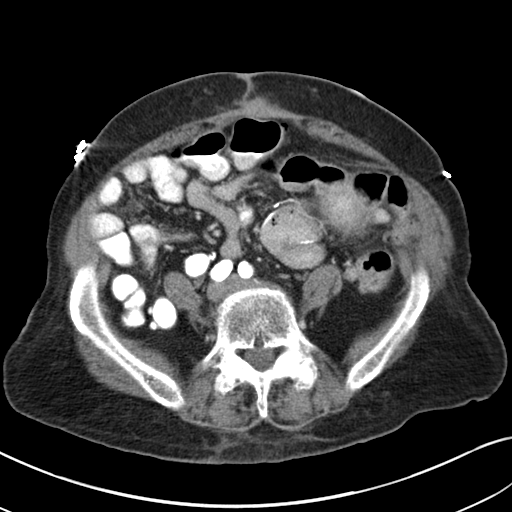
[im 48/91  soft-tissue]
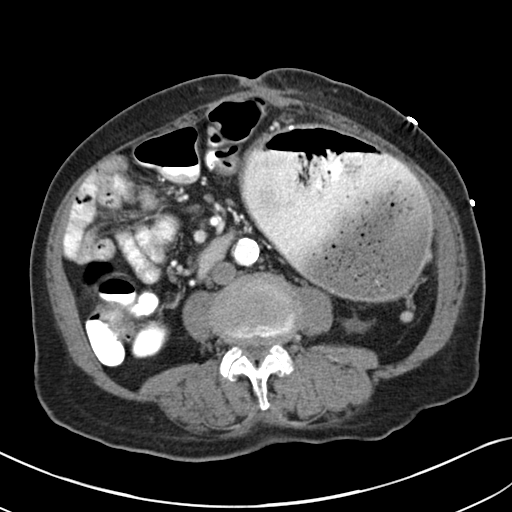
[im 53/91  soft-tissue]
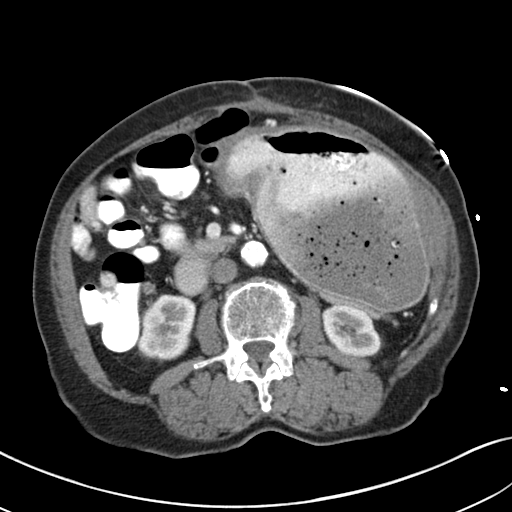
[im 62/91  soft-tissue]
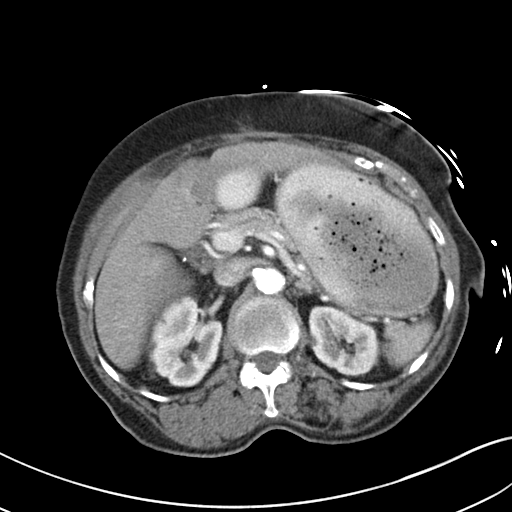
[im 67/91  soft-tissue]
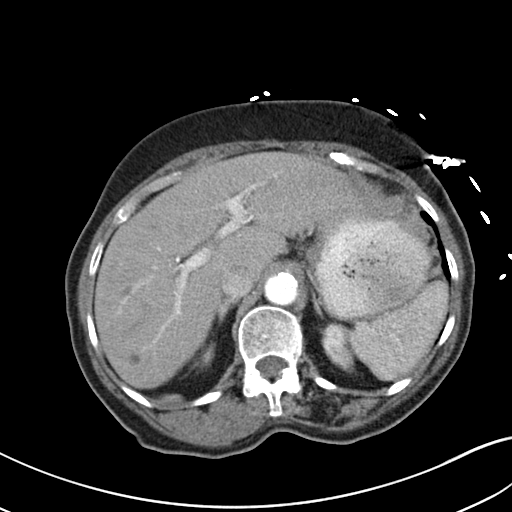
[im 67/91  bone]
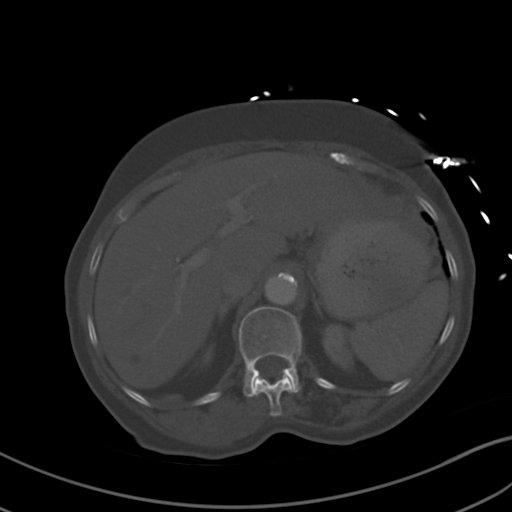
[im 72/91  lung]
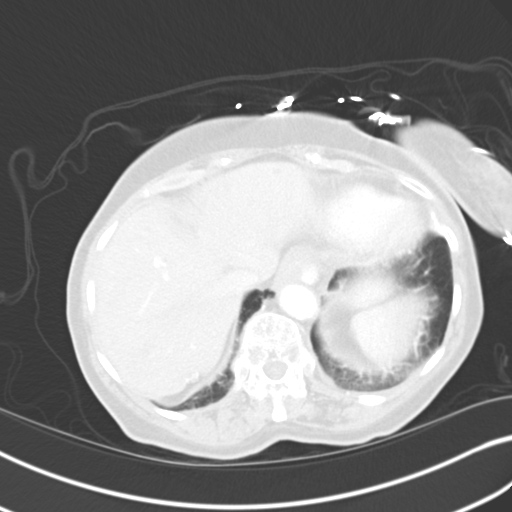
[im 76/91  soft-tissue]
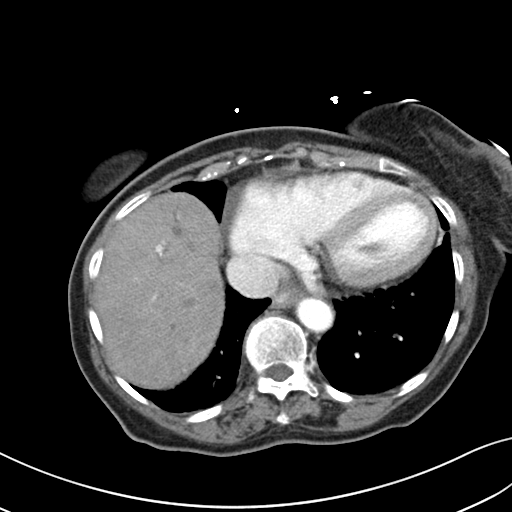
[im 76/91  lung]
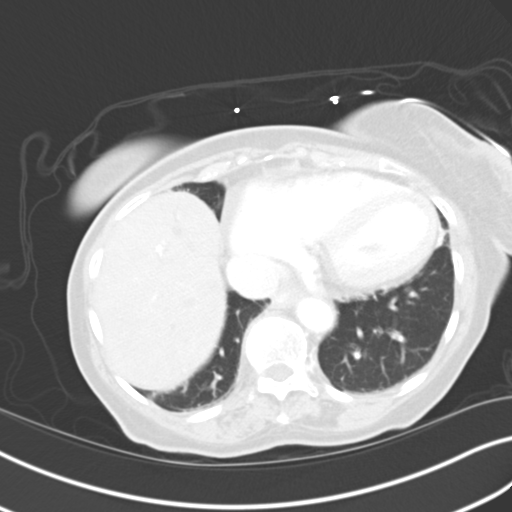
[im 81/91  lung]
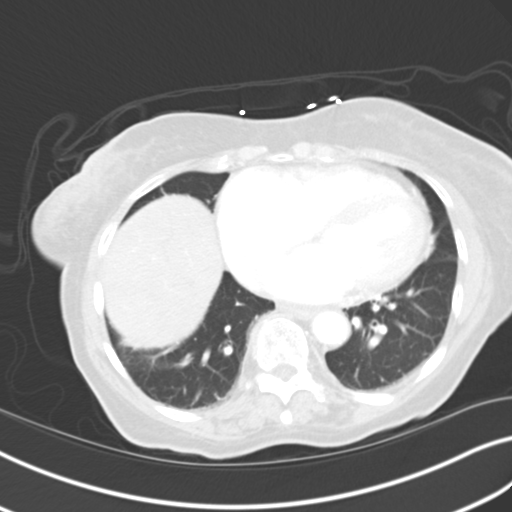
[im 86/91  soft-tissue]
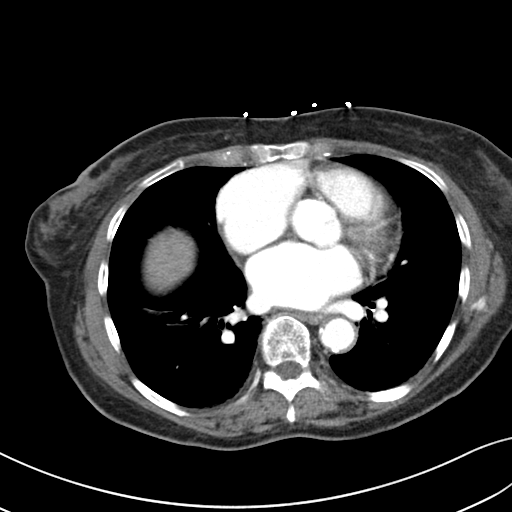
[im 86/91  lung]
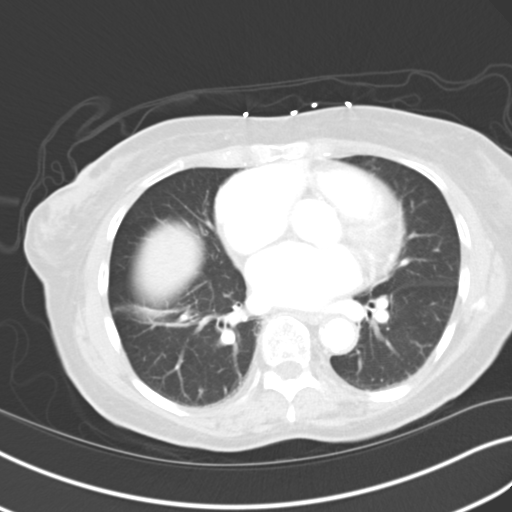

[13 of 32 positions shown; findings below may reference images not displayed]

FINDINGS: Lower chest: Cardiomegaly with biatrial dilatation. Calcifications
of the aortic valve. Atherosclerotic calcifications in the left main
and left circumflex coronary arteries. Small hiatal hernia.

Hepatobiliary: Multifocal low-attenuation lesions appear unchanged
in size, number and distribution scattered throughout the liver.
Many these are too small to definitively characterize, while the
larger lesions are intermediate attenuation, favored to represent
proteinaceous cysts. The largest of these lesions measures only 12
mm in segment 2 (image 21 of series 2). No new hepatic lesions are
noted. No intra or extrahepatic biliary ductal dilatation. Status
post cholecystectomy.

Pancreas: Unremarkable.

Spleen: Unremarkable.

Adrenals/Urinary Tract: Again noted is a 1.2 cm intermediate
attenuation (23 HU) lesion in the lower pole of the left kidney,
with some surrounding architectural distortion, favored to represent
a previously ruptured proteinaceous cyst. No suspicious renal
lesions are noted. Multifocal cortical thinning in the kidneys
bilaterally, presumably mild scarring. No hydroureteronephrosis.
Urinary bladder is normal in appearance. Bilateral adrenal glands
are normal in appearance.

Stomach/Bowel: As with the prior examination there is extensive
thickening of the gastric wall in the antral pre-pyloric region of
the stomach. The pylorus is also markedly thickened (up to 15 mm in
thickness). The stomach is moderately distended. No pathologic
dilatation of small bowel or colon. Postoperative changes of partial
colectomy near the rectosigmoid junction are noted. A few scattered
colonic diverticulae are noted, without surrounding inflammatory
changes to suggest an acute diverticulitis at this time. The distal
rectum and anus are grossly unremarkable in appearance.

Vascular/Lymphatic: Extensive atherosclerosis throughout the
abdominal and pelvic vasculature, without evidence of aneurysm or
dissection. No lymphadenopathy noted in the abdomen or pelvis.

Reproductive: Retroverted uterus.  Ovaries are atrophic.

Other: No significant volume of ascites.  No pneumoperitoneum.

Musculoskeletal: Tiny ventral hernia, through which there is
incomplete herniation of the mid transverse colon; no findings to
suggest associated bowel incarceration or obstruction at this time.
Old compression fracture of superior endplate of T11 with
approximately 20% loss of anterior vertebral body height
(unchanged). There are no aggressive appearing lytic or blastic
lesions noted in the visualized portions of the skeleton.
IMPRESSION: 1. No findings to account for the patient's worsening rectal
pressure.
2. There is persistent thickening of the pylorus and antral
pre-pyloric region of the stomach, which could be inflammatory in
the setting of ulcer disease, or could be neoplastic. There is
persistent moderate dilatation of the stomach, suggesting some
degree of gastric outlet obstruction. Correlation with results of
recent upper endoscopy is suggested.
3. Additional incidental findings, as above, similar to prior
examination.

## 2016-08-07 MED ORDER — FUROSEMIDE 20 MG PO TABS: 20.0000 mg | ORAL_TABLET | Freq: Every day | ORAL | 3 refills | Status: DC

## 2016-08-07 MED ORDER — GABAPENTIN 300 MG PO CAPS: ORAL_CAPSULE | ORAL | 0 refills | Status: DC

## 2016-08-07 NOTE — Progress Notes (Deleted)
Careteam: Patient Care Team: Gildardo Cranker, DO as PCP - General (Internal Medicine) Riccardo Dubin, MD as Referring Physician (Cardiology) Alveta Heimlich, MD as Referring Physician (Ophthalmology) Florance, Tomasa Blase, RN as [place] Management Edrick Kins, DPM as Consulting Physician (Podiatry)  Advanced Directive information Does Patient Have a Medical Advance Directive?: Yes, Type of Advance Directive: Healthcare Power of Attorney  Allergies  Allergen Reactions  . Penicillins Itching, Rash and Other (See Comments)    Tolerated unasyn, zosyn & cephalosporins in the past.  Has patient had a PCN reaction causing immediate rash, facial/tongue/throat swelling, SOB or lightheadedness with hypotension: Yes Has patient had a PCN reaction causing severe rash involving mucus membranes or skin necrosis: Unk Has patient had a PCN reaction that required hospitalization: Unk Has patient had a PCN reaction occurring within the last 10 years: Unk If all of the above answers are "NO", then may proceed with Cephalosporins    Chief Complaint  Patient presents with  . Transitions Of Care    Pt is had recent stay at [facility] 07/28/16 to 08/01/16 for diabetic ulcer of left foot.      HPI: Patient is a 83 y.o. female seen in the office today to follow up hospitalization  Pt was hospitalized due to gangrene and osteomyelitis of 2nd toe.  Did not wish to have an amputation  due to her age and other co-morbidities reports she does not think she could go through it.  Has been prescribed Clindamycin 300 mg daily three times daily for 1 month. Pt was following with vascular prior to hospitalization. She has been also seeing podiatrist but they referred her to the wound care center which she will go tomorrow 08/08/16 in [place].  Per daughter NR was 3 (something) by home health nurse but no longer has nursing coming out to home- now coumadin clinic managing her  INR- plans to call for follow up. Coumadin was adjusted.  Daughter reports her mom is having increase in pain to left leg and foot. Feels like this is her neuropathy, shooting stabbing pain. She was previously taking gabapentin 2 tablets BID and 1 tablet in the afternoon but hospital decreased it on prior hospitalization and then at follow up visit it was increased to 1 tablet TID.  Elevating her leg helps the pain.  She was taken off her lasix. Daughter reports her leg has been more swollen and her weight is gradually increasing. Daughter states she is more short of breath, increase in wheezing and cough.   Blood sugars have been good throughout the day; was elevated prior to hospitalization, now it has improved.  Having diarrhea since she has been home. Bottom is sore from cleaning her self. No fevers. Denies foul odor or mucous.    Review of Systems:  Review of Systems***  Past Medical History:  Diagnosis Date  . Anemia    previous blood transfusions  . Arthritis    "all over"  . Asthma   . Bradycardia    requiring previous d/c of BB and reduction of amiodarone  . CAD (coronary artery disease)    nonobstructive per notes  . Chronic diastolic CHF (congestive heart failure) ([place])   . CKD (chronic kidney disease), stage III   . Complication of blood transfusion    "got the wrong blood type at [facility] in ~ 2015; no adverse reaction that we are aware of"/daughter, [name] (01/27/2016)  . COPD (chronic obstructive pulmonary disease) ([place])   .  Depression    "light case"  . DVT (deep venous thrombosis) ([place]) 01/2016   a. LLE DVT 01/2016 - switched from Eliquis to Coumadin.  . Gastric stenosis    a. s/p stomach tube  . GERD (gastroesophageal reflux disease)   . History of blood transfusion    "several" (01/27/2016)  . History of stomach ulcers   . Hyperlipidemia   . Hypertension   . Hypothyroidism   . Paraesophageal hernia   . Perforated gastric ulcer ([place])   . Seasonal  allergies   . SIADH (syndrome of inappropriate ADH production) ([place])    [name] 01/10/2015  . Small bowel obstruction ([place])    "I don't know how many" (01/11/2015)  . Stroke ([place])    "light one"  . Type II diabetes mellitus ([place])    "related to prednisone use  for > 20 yr; once predinose stopped; no more DM RX" (01/27/2016)  . Ventral hernia with bowel obstruction    Past Surgical History:  Procedure Laterality Date  . CATARACT EXTRACTION W/ INTRAOCULAR LENS  IMPLANT, BILATERAL    . CHOLECYSTECTOMY OPEN    . COLECTOMY    . ESOPHAGOGASTRODUODENOSCOPY N/A 01/19/2014   Procedure: ESOPHAGOGASTRODUODENOSCOPY (EGD);  Surgeon: Irene Shipper, MD;  Location: [facility];  Service: Endoscopy;  Laterality: N/A;  . ESOPHAGOGASTRODUODENOSCOPY N/A 01/20/2014   Procedure: ESOPHAGOGASTRODUODENOSCOPY (EGD);  Surgeon: Irene Shipper, MD;  Location: [facility];  Service: Endoscopy;  Laterality: N/A;  . ESOPHAGOGASTRODUODENOSCOPY N/A 03/19/2014   Procedure: ESOPHAGOGASTRODUODENOSCOPY (EGD);  Surgeon: Milus Banister, MD;  Location: [facility];  Service: Endoscopy;  Laterality: N/A;  . ESOPHAGOGASTRODUODENOSCOPY N/A 07/08/2015   Procedure: ESOPHAGOGASTRODUODENOSCOPY (EGD);  Surgeon: Doran Stabler, MD;  Location: [facility] ENDOSCOPY;  Service: Endoscopy;  Laterality: N/A;  . ESOPHAGOGASTRODUODENOSCOPY (EGD) WITH PROPOFOL N/A 09/15/2015   Procedure: ESOPHAGOGASTRODUODENOSCOPY (EGD) WITH PROPOFOL;  Surgeon: Manus Gunning, MD;  Location: [facility] ENDOSCOPY;  Service: Gastroenterology;  Laterality: N/A;  . GASTROJEJUNOSTOMY     hx/notes 01/10/2015  . GASTROJEJUNOSTOMY N/A 09/23/2015   Procedure: OPEN GASTROJEJUNOSTOMY TUBE PLACEMENT;  Surgeon: Arta Bruce Kinsinger, MD;  Location: [facility] ORS;  Service: General;  Laterality: N/A;  . GLAUCOMA SURGERY Bilateral   . HERNIA REPAIR  2015  . IR GENERIC HISTORICAL  01/07/2016   IR GJ TUBE CHANGE 01/07/2016 Jacqulynn Cadet, MD [facility]  . IR GENERIC HISTORICAL  01/27/2016     IR MECH REMOV OBSTRUC MAT ANY COLON TUBE W/FLUORO 01/27/2016 Markus Daft, MD [facility]  . IR GENERIC HISTORICAL  02/07/2016   IR PATIENT EVAL TECH 0-60 MINS Darrell K Allred, PA-C [facility]  . IR GENERIC HISTORICAL  02/08/2016   IR GJ TUBE CHANGE 02/08/2016 Greggory Keen, MD [facility]  . IR GENERIC HISTORICAL  01/06/2016   IR GJ TUBE CHANGE 01/06/2016 [facility] OUT REF  . IR GENERIC HISTORICAL  05/02/2016   IR CM INJ ANY COLONIC TUBE W/FLUORO 05/02/2016 Arne Cleveland, MD [facility]  . IR GENERIC HISTORICAL  05/15/2016   IR GJ TUBE CHANGE 05/15/2016 Sandi Mariscal, MD [facility]  . IR GENERIC HISTORICAL  06/28/2016   IR GJ TUBE CHANGE 06/28/2016 [facility]  . LAPAROTOMY N/A 01/20/2015   Procedure: EXPLORATORY LAPAROTOMY;  Surgeon: Coralie Keens, MD;  Location: [facility];  Service: General;  Laterality: N/A;  . LYSIS OF ADHESION N/A 01/20/2015   Procedure: LYSIS OF ADHESIONS < 1 HOUR;  Surgeon: Coralie Keens, MD;  Location: [facility];  Service: General;  Laterality: N/A;  . TONSILLECTOMY    .  TUBAL LIGATION    . VENTRAL HERNIA REPAIR  2015   incarcerated ventral hernia ([facility] 09/2013)/notes 01/10/2015   Social History:   reports that she has never smoked. She has quit using smokeless tobacco. Her smokeless tobacco use included Snuff. She reports that she does not drink alcohol or use drugs.  Family History  Problem Relation Age of Onset  . Stroke Mother   . Hypertension Mother   . Diabetes Brother   . Heart attack Neg Hx     Medications: Patient's Medications  New Prescriptions   No medications on file  Previous Medications   AMINO ACIDS-PROTEIN HYDROLYS PO    Take 30 mLs by mouth 2 (two) times daily.   AMLODIPINE (NORVASC) 10 MG TABLET    TAKE 1 TABLET BY MOUTH DAILY   ATORVASTATIN (LIPITOR) 10 MG TABLET    TAKE ONE TABLET BY MOUTH ONCE DAILY   B COMPLEX VITAMINS TABLET    Take 1 tablet by mouth daily.    BISACODYL (DULCOLAX) 10 MG SUPPOSITORY    Place 1 suppository (10 mg  total) rectally daily as needed for moderate constipation.   CALCIUM-VITAMIN D (OSCAL WITH D) 500-200 MG-UNIT PER TABLET    Take 1 tablet by mouth daily with breakfast.    CHLORHEXIDINE (PERIDEX) 0.12 % SOLUTION    Use as directed 15 mLs in the mouth or throat 2 (two) times daily.   CHLORPHEN-PE-ACETAMINOPHEN (SINUS CONGESTION/PAIN DAY/NGHT PO)    Take 1 capsule by mouth daily as needed (for sinus irritation).    CLINDAMYCIN (CLEOCIN) 300 MG CAPSULE    Take 1 capsule (300 mg total) by mouth every 8 (eight) hours.   FEEDING SUPPLEMENT, GLUCERNA SHAKE, (GLUCERNA SHAKE) LIQD    Take 237 mLs by mouth daily.    FLUTICASONE (FLONASE) 50 MCG/ACT NASAL SPRAY    Place 2 sprays into both nostrils daily.   GABAPENTIN (NEURONTIN) 300 MG CAPSULE    Take 300 mg by mouth 3 (three) times daily.    INSULIN GLARGINE (LANTUS SOLOSTAR) 100 UNIT/ML SOLOSTAR PEN    Inject 5 Units into the skin daily at 10 pm.   IPRATROPIUM-ALBUTEROL (DUONEB) 0.5-2.5 (3) MG/3ML SOLN    Take 3 mLs by nebulization every 6 (six) hours as needed (sob). Use 3 times daily x 4 days then every 6 hours as needed.   ISOSORBIDE MONONITRATE (IMDUR) 30 MG 24 HR TABLET    Take 30 mg by mouth daily.   LATANOPROST (XALATAN) 0.005 % OPHTHALMIC SOLUTION    Place 1 drop into both eyes at bedtime.   LEVALBUTEROL (XOPENEX HFA) 45 MCG/ACT INHALER    Inhale 2 puffs into the lungs every 6 (six) hours as needed for wheezing or shortness of breath.   LEVOTHYROXINE (SYNTHROID, LEVOTHROID) 137 MCG TABLET    TAKE 1 TABLET (137 MCG TOTAL) BY MOUTH DAILY BEFORE BREAKFAST.   LOSARTAN (COZAAR) 50 MG TABLET    Take 50 mg by mouth daily.   MALTODEXTRIN-XANTHAN GUM (RESOURCE THICKENUP CLEAR) POWD    Take 120 g by mouth as needed (use to thicken liquids.).   MULTIPLE VITAMIN (MULTIVITAMIN WITH MINERALS) TABS TABLET    Take 1 tablet by mouth daily.   NUTRITIONAL SUPPLEMENTS (FEEDING SUPPLEMENT, OSMOLITE 1.5 CAL,) LIQD    Place 1,000 mLs into feeding tube daily.   POLYETHYL  GLYCOL-PROPYL GLYCOL 0.4-0.3 % SOLN    Place 1 drop into both eyes 4 (four) times daily.    POLYETHYLENE GLYCOL (MIRALAX / GLYCOLAX) PACKET  Take 17 g by mouth daily as needed for mild constipation.   RESTASIS 0.05 % OPHTHALMIC EMULSION    USE 1 DROP INTO BOTH EYES TWICE DAILY   SERTRALINE (ZOLOFT) 100 MG TABLET    TAKE ONE TABLET BY MOUTH ONCE DAILY FOR DEPRESSION   TRAMADOL (ULTRAM) 50 MG TABLET    Take 50 mg by mouth See admin instructions. Every 4-6 hours as needed for pain   WARFARIN (COUMADIN) 2.5 MG TABLET    TAKE AS DIRECTED BY COUMADIN CLINIC.   WATER FOR IRRIGATION, STERILE (FREE WATER) SOLN    Place 230 mLs into feeding tube 3 (three) times daily.  Modified Medications   No medications on file  Discontinued Medications   No medications on file     Physical Exam:  Vitals:   08/07/16 1312  BP: 134/64  Pulse: 69  Resp: 18  Temp: 97.8 F (36.6 C)  TempSrc: Oral  SpO2: 99%  Weight: 151 lb 6.4 oz (68.7 kg)  Height: 5\' 6"  (1.676 m)   Body mass index is 24.44 kg/m.  Physical Exam***  Labs reviewed: Basic Metabolic Panel:  Recent Labs  09/16/15 0322  05/08/16 1807  06/08/16 1523  06/08/16 2359 06/10/16 0317  07/18/16 1627 07/21/16 1124 07/28/16 2039 07/29/16 0041 08/01/16 0528  NA 137  < >  --   < > 135  < > 138 134*  < > 134*  --  136 135 131*  K 3.5  < >  --   < > 4.4  < > 3.9 3.5  < > 4.6  --  4.2 4.5 4.7  CL 110  < >  --   < > 99*  < > 103 100*  < > 96*  --  102 102 100*  CO2 22  < >  --   < > 29  --  26 24  < > 26  --   --  23 24  GLUCOSE 83  < >  --   < > 148*  < > 191* 142*  < > 111*  --  101* 89 153*  BUN 7  < >  --   < > 54*  < > 49* 52*  < > 36*  --  43* 42* 29*  CREATININE 1.01*  < >  --   < > 1.97*  < > 1.60* 1.30*  < > 1.31*  --  1.30* 1.15* 1.01*  CALCIUM 8.0*  < >  --   < > 8.7*  --  9.3 8.7*  < > 9.7  --   --  9.8 9.3  MG 1.7  < >  --   < > 1.9  --  1.9 2.1  --   --   --   --   --   --   PHOS 1.8*  --   --   --   --   --  3.3 3.3  --   --    --   --   --   --   TSH  --   < > 7.468*  --  10.017*  --   --   --   --   --  4.97*  --   --   --   < > = values in this interval not displayed. Liver Function Tests:  Recent Labs  05/28/16 0519 06/08/16 1523 06/18/16 1607  AST 32 25 21  ALT 52 24 25  ALKPHOS 72 53 71  BILITOT 0.3 0.6 0.4  PROT 6.1* 5.4* 6.6  ALBUMIN 2.8* 2.8* 3.1*    Recent Labs  08/26/15 0630 09/11/15 2351 11/06/15 1612  LIPASE 18 81* 21   No results for input(s): AMMONIA in the last 8760 hours. CBC:  Recent Labs  06/18/16 1607  07/18/16 1627 07/28/16 2032  07/29/16 0041 07/31/16 0452 08/01/16 0528  WBC 7.5  < > 14.7* 9.1  --  11.5* 8.6 8.6  NEUTROABS 4.3  --  11.0* 6.4  --   --   --   --   HGB 10.4*  --  9.4* 8.9*  < > 7.9* 8.9* 10.2*  HCT 31.0*  < > 27.3* 26.0*  < > 23.4* 26.9* 30.7*  MCV 85.2  < > 85.8 85.5  --  86.0 86.2 86.5  PLT 238  < > 316 530*  --  591* 526* 577*  < > = values in this interval not displayed. Lipid Panel: No results for input(s): CHOL, HDL, LDLCALC, TRIG, CHOLHDL, LDLDIRECT in the last 8760 hours. TSH:  Recent Labs  05/08/16 1807 06/08/16 1523 07/21/16 1124  TSH 7.468* 10.017* 4.97*   A1C: Lab Results  Component Value Date   HGBA1C 8.0 (H) 05/30/2016   Mr Foot Left Wo Contrast  Result Date: 07/29/2016 CLINICAL DATA:  Diabetic left foot wound.  Second toe appears block. EXAM: MRI OF THE LEFT FOOT WITHOUT CONTRAST TECHNIQUE: Multiplanar, multisequence MR imaging of the left forefoot was performed. No intravenous contrast was administered. COMPARISON:  None. FINDINGS: Bones/Joint/Cartilage Marrow signal abnormality consistent with bone marrow edema is identified involving the base of the second proximal phalanx. This overlies an area of plantar soft tissue cellulitis with ulceration. Despite lack of IV contrast there is suspicion for a subcutaneous soft tissue abscess along the plantar aspect of the left second toe measuring 6 x 3 x 7 mm characterized by irregular  hypointensity within the area of inflamed soft tissue. Similar plantar soft tissue edema and/or cellulitis is seen along the plantar aspect of the fourth and fifth digits. Hallux valgus with osteoarthritic joint space narrowing is noted of the great toe. DIP and PIP joint space narrowing as well as interphalangeal joint space narrowing of the great toe are identified. Ligaments Noncontributory Muscles and Tendons No intramuscular hemorrhage, hematoma or atrophy. The extensor and flexor tendons of the forefoot are grossly intact without sickle-cell abnormalities noted. Soft tissues Diffuse soft tissue swelling/cellulitis of the forefoot especially along the dorsum. IMPRESSION: 1. Marrow signal abnormality involving the base of the second proximal phalanx overlying an area of soft tissue ulceration, inflammation and possible tiny subcutaneous abscess measuring 6 x 3 x 7 mm. This raises concern for osteomyelitis though specificity is somewhat limited by lack of IV contrast. 2. Plantar soft tissue inflammation also noted along the base of the fourth and fifth digits without adjacent marrow signal abnormalities. 3. Hallux valgus with osteoarthritic joint space narrowing of the great toe. Lesser degree of the joint space narrowing is identified of the PIP and DIP joints of the second through fifth digits. Electronically Signed   By: Ashley Royalty M.D.   On: 07/29/2016 01:16   Dg Foot Complete Left  Result Date: 07/28/2016 CLINICAL DATA:  83 y/o F; increasing foot pain in the plantar and anterior site of the foot. EXAM: LEFT FOOT - COMPLETE 3+ VIEW COMPARISON:  08/26/2014 foot radiographs FINDINGS: Bones are demineralized. Hallux valgus. Lisfranc alignment is maintained. Pes planus. No acute fracture identified. Plantar calcaneal enthesophyte. Vascular calcifications.  IMPRESSION: No acute fracture identified. Hallux valgus. Pes planus. Plantar calcaneal enthesophyte. Electronically Signed   By: Kristine Garbe  M.D.   On: 07/28/2016 21:10    Assessment/Plan There are no diagnoses linked to this encounter.   [name]. Harle Battiest  [facility] [phone] 8 am - 5 pm) [phone] (after hours)

## 2016-08-07 NOTE — Patient Instructions (Addendum)
Start probiotic (Florastor) twice daily To eat Activia daily   Restart lasix  May take additional dose of Gabapentin 300 mg by mouth every 8 hours as needed- cont Gabapentin 300 mg 1 tablet TID

## 2016-08-08 ENCOUNTER — Encounter: Payer: Medicare Other | Attending: Internal Medicine | Admitting: Internal Medicine

## 2016-08-08 ENCOUNTER — Other Ambulatory Visit: Payer: Self-pay

## 2016-08-08 DIAGNOSIS — E1122 Type 2 diabetes mellitus with diabetic chronic kidney disease: Secondary | ICD-10-CM | POA: Insufficient documentation

## 2016-08-08 DIAGNOSIS — M868X7 Other osteomyelitis, ankle and foot: Secondary | ICD-10-CM | POA: Insufficient documentation

## 2016-08-08 DIAGNOSIS — K219 Gastro-esophageal reflux disease without esophagitis: Secondary | ICD-10-CM | POA: Diagnosis not present

## 2016-08-08 DIAGNOSIS — D649 Anemia, unspecified: Secondary | ICD-10-CM | POA: Insufficient documentation

## 2016-08-08 DIAGNOSIS — E1169 Type 2 diabetes mellitus with other specified complication: Secondary | ICD-10-CM | POA: Diagnosis not present

## 2016-08-08 DIAGNOSIS — Z794 Long term (current) use of insulin: Secondary | ICD-10-CM | POA: Insufficient documentation

## 2016-08-08 DIAGNOSIS — E1152 Type 2 diabetes mellitus with diabetic peripheral angiopathy with gangrene: Secondary | ICD-10-CM | POA: Insufficient documentation

## 2016-08-08 DIAGNOSIS — L97523 Non-pressure chronic ulcer of other part of left foot with necrosis of muscle: Secondary | ICD-10-CM | POA: Diagnosis not present

## 2016-08-08 DIAGNOSIS — Z7901 Long term (current) use of anticoagulants: Secondary | ICD-10-CM | POA: Insufficient documentation

## 2016-08-08 DIAGNOSIS — N183 Chronic kidney disease, stage 3 (moderate): Secondary | ICD-10-CM | POA: Insufficient documentation

## 2016-08-08 DIAGNOSIS — L97511 Non-pressure chronic ulcer of other part of right foot limited to breakdown of skin: Secondary | ICD-10-CM | POA: Diagnosis not present

## 2016-08-08 DIAGNOSIS — M1990 Unspecified osteoarthritis, unspecified site: Secondary | ICD-10-CM | POA: Insufficient documentation

## 2016-08-08 DIAGNOSIS — J449 Chronic obstructive pulmonary disease, unspecified: Secondary | ICD-10-CM | POA: Insufficient documentation

## 2016-08-08 DIAGNOSIS — S91105A Unspecified open wound of left lesser toe(s) without damage to nail, initial encounter: Secondary | ICD-10-CM | POA: Diagnosis not present

## 2016-08-08 DIAGNOSIS — S91102A Unspecified open wound of left great toe without damage to nail, initial encounter: Secondary | ICD-10-CM | POA: Diagnosis not present

## 2016-08-08 DIAGNOSIS — I130 Hypertensive heart and chronic kidney disease with heart failure and stage 1 through stage 4 chronic kidney disease, or unspecified chronic kidney disease: Secondary | ICD-10-CM | POA: Diagnosis not present

## 2016-08-08 DIAGNOSIS — S91101A Unspecified open wound of right great toe without damage to nail, initial encounter: Secondary | ICD-10-CM | POA: Diagnosis not present

## 2016-08-08 NOTE — Patient Outreach (Signed)
[place] [facility]) Care Management  08/08/2016  [employer] 12/14/1923 [id number]   EMMI-CHF RED ON EMMI ALERT Day # 34 Date: 07/24/16 Red Alert Reason: "New/worsening SOB? Yes "New/worsening problems? Yes"  Day # 29 Date: 07/19/16 Red Alert Reason: "Weighed themselves today? No" "New/worsening problems? Yes" "New swelling? Yes"     Multiple attempts to establish contact with patient without success. No response from letter mailed to patient. Case is being closed at this time.    Plan: RN CM will notify [facility] administrative assistant of case status.   Enzo Montgomery, RN,BSN,CCM [place] Management Telephonic Care Management Coordinator Direct Phone: [phone] Toll Free: [phone] Fax: [phone]

## 2016-08-08 NOTE — Progress Notes (Addendum)
Multi-Disciplinary Care Plan Details Patient Name: Shannon [name], Shannon [name] Date of Service: 08/08/2016 12:45 PM Medical Record Patient Account Number: [id number] Number: Treating RN: Baruch Gouty, RN, BSN, Rita [birth year] [phone] y.o. Other Clinician: Date of  Birth/Sex: Female) Treating ROBSON, Rollins Primary Care Cristofer Yaffe: Eulas Post, MONICA Manaal Mandala/Extender: G Referring Prachi Oftedahl: Alyson Ingles in Treatment: 0 Active Inactive ` Nutrition Nursing Diagnoses: Imbalanced nutrition Impaired glucose control: actual or potential Potential for alteratiion in Nutrition/Potential for imbalanced nutrition Goals: Patient/caregiver agrees to and verbalizes understanding of need to obtain nutritional consultation Date Initiated: 08/08/2016 Target Resolution Date: 11/08/2016 Goal Status: Active Patient/caregiver agrees to and verbalizes understanding of need to use nutritional supplements and/or vitamins as prescribed Date Initiated: 08/08/2016 Target Resolution Date: 11/08/2016 Goal Status: Active Patient/caregiver verbalizes understanding of need to maintain therapeutic glucose control per primary care physician Date Initiated: 08/08/2016 Target Resolution Date: 11/08/2016 Goal Status: Active Patient/caregiver will maintain therapeutic glucose control Date Initiated: 08/08/2016 Target Resolution Date: 11/08/2016 Goal Status: Active Interventions: Assess patient nutrition upon admission and as needed per policy Provide education on elevated blood sugars and impact on wound healing Provide education on nutrition Treatment Activities: Obtain HgA1c : 08/08/2016 Notes: ROSABELL, GEYER ([id number]) Orientation to the Wound Care Program Nursing Diagnoses: Knowledge deficit related to the wound healing center program Goals: Patient/caregiver will verbalize understanding of the [place] Date Initiated: 08/08/2016 Target Resolution Date: 11/10/2016 Goal Status: Active Interventions: Provide education on orientation to the wound center Notes: ` Osteomyelitis Nursing Diagnoses: Infection: osteomyelitis Knowledge deficit related to disease process and management Potential for infection: osteomyelitis Goals: Diagnostic evaluation for  osteomyelitis completed as ordered Date Initiated: 08/08/2016 Target Resolution Date: 11/10/2016 Goal Status: Active Patient/caregiver will verbalize understanding of disease process and disease management Date Initiated: 08/08/2016 Target Resolution Date: 11/10/2016 Goal Status: Active Patient's osteomyelitis will resolve Date Initiated: 08/08/2016 Target Resolution Date: 11/10/2016 Goal Status: Active Signs and symptoms for osteomyelitis will be recognized and promptly addressed Date Initiated: 08/08/2016 Target Resolution Date: 11/10/2016 Goal Status: Active Interventions: Assess for signs and symptoms of osteomyelitis resolution every visit Provide education on osteomyelitis Screen for HBO Notes: ` Pain, Acute or Chronic DANNIELA, MCBREARTY ([id number]) Nursing Diagnoses: Pain, acute or chronic: actual or potential Potential alteration in comfort, pain Goals: Patient will verbalize adequate pain control and receive pain control interventions during procedures as needed Date Initiated: 08/08/2016 Target Resolution Date: 11/10/2016 Goal Status: Active Patient/caregiver will verbalize adequate pain control between visits Date Initiated: 08/08/2016 Target Resolution Date: 11/10/2016 Goal Status: Active Patient/caregiver will verbalize comfort level met Date Initiated: 08/08/2016 Target Resolution Date: 11/10/2016 Goal Status: Active Interventions: Assess comfort goal upon admission Complete pain assessment as per visit requirements Encourage patient to take pain medications as prescribed Implement pain control techniques (non-pharmaceutical) Provide education on pain management Provision of support: recognize patient pain, provide comfort and support as needed Treatment Activities: Administer pain control measures as ordered : 08/08/2016 Notes: ` Peripheral Neuropathy Nursing Diagnoses: Knowledge deficit related to disease process and management of peripheral neurovascular  dysfunction Potential alteration in peripheral tissue perfusion (select prior to confirmation of diagnosis) Goals: Patient/caregiver will verbalize understanding of disease process and disease management Date Initiated: 08/08/2016 Target Resolution Date: 11/10/2016 Goal Status: Active Interventions: Assess signs and symptoms of neuropathy upon admission and as needed Provide education on Management of Neuropathy and Related Ulcers Provide education on Management of Neuropathy upon discharge from the [facility] ([id number]) Notes: ` Wound/Skin Impairment Nursing Diagnoses: Impaired tissue integrity Knowledge deficit related to ulceration/compromised  X - Complex Wound Measurement - multiple wounds 3 5 _0  - Simple Wound Cleansing - one wound 0 X - Complex Wound Cleansing - multiple wounds 3 5 INTERVENTIONS - Wound Dressings X - Small Wound Dressing one or multiple wounds 3 10 _1  - Medium Wound Dressing one or multiple wounds 0 _2  - Large Wound Dressing one or multiple wounds 0 <[id number]>_7<\/[id number]>_6  - Application of Medications - injection 0 INTERVENTIONS - Miscellaneous _4  - External ear exam 0 _5  - Specimen Collection (cultures, biopsies, blood, body fluids, etc.) 0 _6  - Specimen(s) / Culture(s) sent or taken to Lab for analysis 0 _7  - Patient Transfer (multiple staff / Harrel Lemon Lift / Similar devices) 0 _8  - Simple Staple / Suture removal (25 or less) 0 [place], Halen ([id number]) _9  - Complex Staple / Suture removal (26 or more) 0 _10  - Hypo / Hyperglycemic Management (close monitor of Blood Glucose) 0 X - Ankle / Brachial Index (ABI) - do not check if billed separately 1 15 Has the patient been seen at the hospital within the last three years: Yes Total Score: 190 Level Of Care: New/Established - Level 5 Electronic Signature(s) Signed: 08/08/2016 5:27:57 PM By: Regan Lemming BSN, RN Entered By: Regan Lemming on 08/08/2016 14:15:22 [name] ([id number]) -------------------------------------------------------------------------------- Encounter Discharge Information Details Patient Name: [name] Date of Service: 08/08/2016 12:45 PM Medical Record Patient Account Number: [id number] Number: Treating RN: Baruch Gouty, RN, BSN, Rita 24-Feb-1924 (83 y.o. Other Clinician: Date of Birth/Sex: Female) Treating ROBSON, MICHAEL Primary Care Lennox Leikam: Eulas Post,  MONICA Pasqual Farias/Extender: G Referring Mishel Sans: Alyson Ingles in Treatment: 0 Encounter Discharge Information Items Discharge Pain Level: 0 Discharge Condition: Stable Ambulatory Status: Walker Discharge Destination: Home Transportation: Private Auto Accompanied By: dtr Schedule Follow-up Appointment: No Medication Reconciliation completed No and provided to Patient/Care Teriyah Purington: Provided on Clinical Summary of Care: 08/08/2016 Form Type Recipient Paper Patient LS Electronic Signature(s) Signed: 08/08/2016 5:27:57 PM By: Regan Lemming BSN, RN Previous Signature: 08/08/2016 2:48:16 PM Version By: Ruthine Dose Entered By: Regan Lemming on 08/08/2016 14:50:49 [name] ([id number]) -------------------------------------------------------------------------------- Lower Extremity Assessment Details Patient Name: [name] Date of Service: 08/08/2016 12:45 PM Medical Record Patient Account Number: [id number] Number: Treating RN: Baruch Gouty, RN, BSN, Rita [birth year] (83 y.o. Other Clinician: Date of Birth/Sex: Female) Treating ROBSON, MICHAEL Primary Care Ople Girgis: Eulas Post, MONICA Khadeja Abt/Extender: G Referring Brilynn Biasi: Daylene Katayama Weeks in Treatment: 0 Edema Assessment Assessed: [Left: No] [Right: No] Edema: [Left: Yes] [Right: No] Vascular Assessment Claudication: Claudication Assessment [Left:None] [Right:None] Pulses: Dorsalis Pedis Palpable: [Left:No] [Right:No] Doppler Audible: [Left:Yes] [Right:Yes] Posterior Tibial Palpable: [Left:Yes] [Right:Yes] Doppler Audible: [Left:Yes] [Right:Yes] Extremity colors, hair growth, and conditions: Extremity Color: [Left:Dusky] [Right:Dusky] Hair Growth on Extremity: [Left:No] [Right:No] Temperature of Extremity: [Left:Warm] [Right:Warm] Capillary Refill: [Left:< 3 seconds] [Right:< 3 seconds] Blood Pressure: Brachial: [Left:151] Dorsalis Pedis: 77 [Left:Dorsalis Pedis: 237] Ankle: Posterior Tibial: [Left:Posterior  Tibial: 0.51] [Right:0.79] Toe Nail Assessment Left: Right: Thick: Yes Yes Discolored: Yes Yes Deformed: Yes Yes Improper Length and Hygiene: Yes Yes Electronic Signature(s) Signed: 08/08/2016 5:27:57 PM By: Regan Lemming BSN, RN [place], Epifania Gore ([id number]) Entered By: Regan Lemming on 08/08/2016 13:50:11 [name] ([id number]) -------------------------------------------------------------------------------- Multi Wound Chart Details Patient Name: [name] Date of Service: 08/08/2016 12:45 PM Medical Record Patient Account Number: [id number] Number: Treating RN: Baruch Gouty, RN, BSN, Rita 1923-08-13 (83 y.o. Other Clinician: Date of Birth/Sex: Female) Treating ROBSON, MICHAEL Primary Care Joleene Burnham: Eulas Post, MONICA Philomena Buttermore/Extender: G Referring Carder Yin: Daylene Katayama Weeks in Treatment: 0 Vital Signs Height(in): 66 Pulse(bpm): 89 Weight(lbs):  Multi-Disciplinary Care Plan Details Patient Name: Shannon [name], Shannon [name] Date of Service: 08/08/2016 12:45 PM Medical Record Patient Account Number: [id number] Number: Treating RN: Baruch Gouty, RN, BSN, Rita [birth year] [phone] y.o. Other Clinician: Date of  Birth/Sex: Female) Treating ROBSON, Rollins Primary Care Cristofer Yaffe: Eulas Post, MONICA Manaal Mandala/Extender: G Referring Prachi Oftedahl: Alyson Ingles in Treatment: 0 Active Inactive ` Nutrition Nursing Diagnoses: Imbalanced nutrition Impaired glucose control: actual or potential Potential for alteratiion in Nutrition/Potential for imbalanced nutrition Goals: Patient/caregiver agrees to and verbalizes understanding of need to obtain nutritional consultation Date Initiated: 08/08/2016 Target Resolution Date: 11/08/2016 Goal Status: Active Patient/caregiver agrees to and verbalizes understanding of need to use nutritional supplements and/or vitamins as prescribed Date Initiated: 08/08/2016 Target Resolution Date: 11/08/2016 Goal Status: Active Patient/caregiver verbalizes understanding of need to maintain therapeutic glucose control per primary care physician Date Initiated: 08/08/2016 Target Resolution Date: 11/08/2016 Goal Status: Active Patient/caregiver will maintain therapeutic glucose control Date Initiated: 08/08/2016 Target Resolution Date: 11/08/2016 Goal Status: Active Interventions: Assess patient nutrition upon admission and as needed per policy Provide education on elevated blood sugars and impact on wound healing Provide education on nutrition Treatment Activities: Obtain HgA1c : 08/08/2016 Notes: ROSABELL, GEYER ([id number]) Orientation to the Wound Care Program Nursing Diagnoses: Knowledge deficit related to the wound healing center program Goals: Patient/caregiver will verbalize understanding of the [place] Date Initiated: 08/08/2016 Target Resolution Date: 11/10/2016 Goal Status: Active Interventions: Provide education on orientation to the wound center Notes: ` Osteomyelitis Nursing Diagnoses: Infection: osteomyelitis Knowledge deficit related to disease process and management Potential for infection: osteomyelitis Goals: Diagnostic evaluation for  osteomyelitis completed as ordered Date Initiated: 08/08/2016 Target Resolution Date: 11/10/2016 Goal Status: Active Patient/caregiver will verbalize understanding of disease process and disease management Date Initiated: 08/08/2016 Target Resolution Date: 11/10/2016 Goal Status: Active Patient's osteomyelitis will resolve Date Initiated: 08/08/2016 Target Resolution Date: 11/10/2016 Goal Status: Active Signs and symptoms for osteomyelitis will be recognized and promptly addressed Date Initiated: 08/08/2016 Target Resolution Date: 11/10/2016 Goal Status: Active Interventions: Assess for signs and symptoms of osteomyelitis resolution every visit Provide education on osteomyelitis Screen for HBO Notes: ` Pain, Acute or Chronic DANNIELA, MCBREARTY ([id number]) Nursing Diagnoses: Pain, acute or chronic: actual or potential Potential alteration in comfort, pain Goals: Patient will verbalize adequate pain control and receive pain control interventions during procedures as needed Date Initiated: 08/08/2016 Target Resolution Date: 11/10/2016 Goal Status: Active Patient/caregiver will verbalize adequate pain control between visits Date Initiated: 08/08/2016 Target Resolution Date: 11/10/2016 Goal Status: Active Patient/caregiver will verbalize comfort level met Date Initiated: 08/08/2016 Target Resolution Date: 11/10/2016 Goal Status: Active Interventions: Assess comfort goal upon admission Complete pain assessment as per visit requirements Encourage patient to take pain medications as prescribed Implement pain control techniques (non-pharmaceutical) Provide education on pain management Provision of support: recognize patient pain, provide comfort and support as needed Treatment Activities: Administer pain control measures as ordered : 08/08/2016 Notes: ` Peripheral Neuropathy Nursing Diagnoses: Knowledge deficit related to disease process and management of peripheral neurovascular  dysfunction Potential alteration in peripheral tissue perfusion (select prior to confirmation of diagnosis) Goals: Patient/caregiver will verbalize understanding of disease process and disease management Date Initiated: 08/08/2016 Target Resolution Date: 11/10/2016 Goal Status: Active Interventions: Assess signs and symptoms of neuropathy upon admission and as needed Provide education on Management of Neuropathy and Related Ulcers Provide education on Management of Neuropathy upon discharge from the [facility] ([id number]) Notes: ` Wound/Skin Impairment Nursing Diagnoses: Impaired tissue integrity Knowledge deficit related to ulceration/compromised  X - Complex Wound Measurement - multiple wounds 3 5 _0  - Simple Wound Cleansing - one wound 0 X - Complex Wound Cleansing - multiple wounds 3 5 INTERVENTIONS - Wound Dressings X - Small Wound Dressing one or multiple wounds 3 10 _1  - Medium Wound Dressing one or multiple wounds 0 _2  - Large Wound Dressing one or multiple wounds 0 <[id number]>_7<\/[id number]>_6  - Application of Medications - injection 0 INTERVENTIONS - Miscellaneous _4  - External ear exam 0 _5  - Specimen Collection (cultures, biopsies, blood, body fluids, etc.) 0 _6  - Specimen(s) / Culture(s) sent or taken to Lab for analysis 0 _7  - Patient Transfer (multiple staff / Harrel Lemon Lift / Similar devices) 0 _8  - Simple Staple / Suture removal (25 or less) 0 [place], Halen ([id number]) _9  - Complex Staple / Suture removal (26 or more) 0 _10  - Hypo / Hyperglycemic Management (close monitor of Blood Glucose) 0 X - Ankle / Brachial Index (ABI) - do not check if billed separately 1 15 Has the patient been seen at the hospital within the last three years: Yes Total Score: 190 Level Of Care: New/Established - Level 5 Electronic Signature(s) Signed: 08/08/2016 5:27:57 PM By: Regan Lemming BSN, RN Entered By: Regan Lemming on 08/08/2016 14:15:22 [name] ([id number]) -------------------------------------------------------------------------------- Encounter Discharge Information Details Patient Name: [name] Date of Service: 08/08/2016 12:45 PM Medical Record Patient Account Number: [id number] Number: Treating RN: Baruch Gouty, RN, BSN, Rita 24-Feb-1924 (83 y.o. Other Clinician: Date of Birth/Sex: Female) Treating ROBSON, MICHAEL Primary Care Lennox Leikam: Eulas Post,  MONICA Pasqual Farias/Extender: G Referring Mishel Sans: Alyson Ingles in Treatment: 0 Encounter Discharge Information Items Discharge Pain Level: 0 Discharge Condition: Stable Ambulatory Status: Walker Discharge Destination: Home Transportation: Private Auto Accompanied By: dtr Schedule Follow-up Appointment: No Medication Reconciliation completed No and provided to Patient/Care Teriyah Purington: Provided on Clinical Summary of Care: 08/08/2016 Form Type Recipient Paper Patient LS Electronic Signature(s) Signed: 08/08/2016 5:27:57 PM By: Regan Lemming BSN, RN Previous Signature: 08/08/2016 2:48:16 PM Version By: Ruthine Dose Entered By: Regan Lemming on 08/08/2016 14:50:49 [name] ([id number]) -------------------------------------------------------------------------------- Lower Extremity Assessment Details Patient Name: [name] Date of Service: 08/08/2016 12:45 PM Medical Record Patient Account Number: [id number] Number: Treating RN: Baruch Gouty, RN, BSN, Rita [birth year] (83 y.o. Other Clinician: Date of Birth/Sex: Female) Treating ROBSON, MICHAEL Primary Care Ople Girgis: Eulas Post, MONICA Khadeja Abt/Extender: G Referring Brilynn Biasi: Daylene Katayama Weeks in Treatment: 0 Edema Assessment Assessed: [Left: No] [Right: No] Edema: [Left: Yes] [Right: No] Vascular Assessment Claudication: Claudication Assessment [Left:None] [Right:None] Pulses: Dorsalis Pedis Palpable: [Left:No] [Right:No] Doppler Audible: [Left:Yes] [Right:Yes] Posterior Tibial Palpable: [Left:Yes] [Right:Yes] Doppler Audible: [Left:Yes] [Right:Yes] Extremity colors, hair growth, and conditions: Extremity Color: [Left:Dusky] [Right:Dusky] Hair Growth on Extremity: [Left:No] [Right:No] Temperature of Extremity: [Left:Warm] [Right:Warm] Capillary Refill: [Left:< 3 seconds] [Right:< 3 seconds] Blood Pressure: Brachial: [Left:151] Dorsalis Pedis: 77 [Left:Dorsalis Pedis: 237] Ankle: Posterior Tibial: [Left:Posterior  Tibial: 0.51] [Right:0.79] Toe Nail Assessment Left: Right: Thick: Yes Yes Discolored: Yes Yes Deformed: Yes Yes Improper Length and Hygiene: Yes Yes Electronic Signature(s) Signed: 08/08/2016 5:27:57 PM By: Regan Lemming BSN, RN [place], Epifania Gore ([id number]) Entered By: Regan Lemming on 08/08/2016 13:50:11 [name] ([id number]) -------------------------------------------------------------------------------- Multi Wound Chart Details Patient Name: [name] Date of Service: 08/08/2016 12:45 PM Medical Record Patient Account Number: [id number] Number: Treating RN: Baruch Gouty, RN, BSN, Rita 1923-08-13 (83 y.o. Other Clinician: Date of Birth/Sex: Female) Treating ROBSON, MICHAEL Primary Care Joleene Burnham: Eulas Post, MONICA Philomena Buttermore/Extender: G Referring Carder Yin: Daylene Katayama Weeks in Treatment: 0 Vital Signs Height(in): 66 Pulse(bpm): 89 Weight(lbs):  X - Complex Wound Measurement - multiple wounds 3 5 _0  - Simple Wound Cleansing - one wound 0 X - Complex Wound Cleansing - multiple wounds 3 5 INTERVENTIONS - Wound Dressings X - Small Wound Dressing one or multiple wounds 3 10 _1  - Medium Wound Dressing one or multiple wounds 0 _2  - Large Wound Dressing one or multiple wounds 0 <[id number]>_7<\/[id number]>_6  - Application of Medications - injection 0 INTERVENTIONS - Miscellaneous _4  - External ear exam 0 _5  - Specimen Collection (cultures, biopsies, blood, body fluids, etc.) 0 _6  - Specimen(s) / Culture(s) sent or taken to Lab for analysis 0 _7  - Patient Transfer (multiple staff / Harrel Lemon Lift / Similar devices) 0 _8  - Simple Staple / Suture removal (25 or less) 0 [place], Halen ([id number]) _9  - Complex Staple / Suture removal (26 or more) 0 _10  - Hypo / Hyperglycemic Management (close monitor of Blood Glucose) 0 X - Ankle / Brachial Index (ABI) - do not check if billed separately 1 15 Has the patient been seen at the hospital within the last three years: Yes Total Score: 190 Level Of Care: New/Established - Level 5 Electronic Signature(s) Signed: 08/08/2016 5:27:57 PM By: Regan Lemming BSN, RN Entered By: Regan Lemming on 08/08/2016 14:15:22 [name] ([id number]) -------------------------------------------------------------------------------- Encounter Discharge Information Details Patient Name: [name] Date of Service: 08/08/2016 12:45 PM Medical Record Patient Account Number: [id number] Number: Treating RN: Baruch Gouty, RN, BSN, Rita 24-Feb-1924 (83 y.o. Other Clinician: Date of Birth/Sex: Female) Treating ROBSON, MICHAEL Primary Care Lennox Leikam: Eulas Post,  MONICA Pasqual Farias/Extender: G Referring Mishel Sans: Alyson Ingles in Treatment: 0 Encounter Discharge Information Items Discharge Pain Level: 0 Discharge Condition: Stable Ambulatory Status: Walker Discharge Destination: Home Transportation: Private Auto Accompanied By: dtr Schedule Follow-up Appointment: No Medication Reconciliation completed No and provided to Patient/Care Teriyah Purington: Provided on Clinical Summary of Care: 08/08/2016 Form Type Recipient Paper Patient LS Electronic Signature(s) Signed: 08/08/2016 5:27:57 PM By: Regan Lemming BSN, RN Previous Signature: 08/08/2016 2:48:16 PM Version By: Ruthine Dose Entered By: Regan Lemming on 08/08/2016 14:50:49 [name] ([id number]) -------------------------------------------------------------------------------- Lower Extremity Assessment Details Patient Name: [name] Date of Service: 08/08/2016 12:45 PM Medical Record Patient Account Number: [id number] Number: Treating RN: Baruch Gouty, RN, BSN, Rita [birth year] (83 y.o. Other Clinician: Date of Birth/Sex: Female) Treating ROBSON, MICHAEL Primary Care Ople Girgis: Eulas Post, MONICA Khadeja Abt/Extender: G Referring Brilynn Biasi: Daylene Katayama Weeks in Treatment: 0 Edema Assessment Assessed: [Left: No] [Right: No] Edema: [Left: Yes] [Right: No] Vascular Assessment Claudication: Claudication Assessment [Left:None] [Right:None] Pulses: Dorsalis Pedis Palpable: [Left:No] [Right:No] Doppler Audible: [Left:Yes] [Right:Yes] Posterior Tibial Palpable: [Left:Yes] [Right:Yes] Doppler Audible: [Left:Yes] [Right:Yes] Extremity colors, hair growth, and conditions: Extremity Color: [Left:Dusky] [Right:Dusky] Hair Growth on Extremity: [Left:No] [Right:No] Temperature of Extremity: [Left:Warm] [Right:Warm] Capillary Refill: [Left:< 3 seconds] [Right:< 3 seconds] Blood Pressure: Brachial: [Left:151] Dorsalis Pedis: 77 [Left:Dorsalis Pedis: 237] Ankle: Posterior Tibial: [Left:Posterior  Tibial: 0.51] [Right:0.79] Toe Nail Assessment Left: Right: Thick: Yes Yes Discolored: Yes Yes Deformed: Yes Yes Improper Length and Hygiene: Yes Yes Electronic Signature(s) Signed: 08/08/2016 5:27:57 PM By: Regan Lemming BSN, RN [place], Epifania Gore ([id number]) Entered By: Regan Lemming on 08/08/2016 13:50:11 [name] ([id number]) -------------------------------------------------------------------------------- Multi Wound Chart Details Patient Name: [name] Date of Service: 08/08/2016 12:45 PM Medical Record Patient Account Number: [id number] Number: Treating RN: Baruch Gouty, RN, BSN, Rita 1923-08-13 (83 y.o. Other Clinician: Date of Birth/Sex: Female) Treating ROBSON, MICHAEL Primary Care Joleene Burnham: Eulas Post, MONICA Philomena Buttermore/Extender: G Referring Carder Yin: Daylene Katayama Weeks in Treatment: 0 Vital Signs Height(in): 66 Pulse(bpm): 89 Weight(lbs):  Multi-Disciplinary Care Plan Details Patient Name: Shannon [name], Shannon [name] Date of Service: 08/08/2016 12:45 PM Medical Record Patient Account Number: [id number] Number: Treating RN: Baruch Gouty, RN, BSN, Rita [birth year] [phone] y.o. Other Clinician: Date of  Birth/Sex: Female) Treating ROBSON, Rollins Primary Care Cristofer Yaffe: Eulas Post, MONICA Manaal Mandala/Extender: G Referring Prachi Oftedahl: Alyson Ingles in Treatment: 0 Active Inactive ` Nutrition Nursing Diagnoses: Imbalanced nutrition Impaired glucose control: actual or potential Potential for alteratiion in Nutrition/Potential for imbalanced nutrition Goals: Patient/caregiver agrees to and verbalizes understanding of need to obtain nutritional consultation Date Initiated: 08/08/2016 Target Resolution Date: 11/08/2016 Goal Status: Active Patient/caregiver agrees to and verbalizes understanding of need to use nutritional supplements and/or vitamins as prescribed Date Initiated: 08/08/2016 Target Resolution Date: 11/08/2016 Goal Status: Active Patient/caregiver verbalizes understanding of need to maintain therapeutic glucose control per primary care physician Date Initiated: 08/08/2016 Target Resolution Date: 11/08/2016 Goal Status: Active Patient/caregiver will maintain therapeutic glucose control Date Initiated: 08/08/2016 Target Resolution Date: 11/08/2016 Goal Status: Active Interventions: Assess patient nutrition upon admission and as needed per policy Provide education on elevated blood sugars and impact on wound healing Provide education on nutrition Treatment Activities: Obtain HgA1c : 08/08/2016 Notes: ROSABELL, GEYER ([id number]) Orientation to the Wound Care Program Nursing Diagnoses: Knowledge deficit related to the wound healing center program Goals: Patient/caregiver will verbalize understanding of the [facility] Date Initiated: 08/08/2016 Target Resolution Date: 11/10/2016 Goal Status: Active Interventions: Provide education on orientation to the wound center Notes: ` Osteomyelitis Nursing Diagnoses: Infection: osteomyelitis Knowledge deficit related to disease process and management Potential for infection: osteomyelitis Goals: Diagnostic evaluation for  osteomyelitis completed as ordered Date Initiated: 08/08/2016 Target Resolution Date: 11/10/2016 Goal Status: Active Patient/caregiver will verbalize understanding of disease process and disease management Date Initiated: 08/08/2016 Target Resolution Date: 11/10/2016 Goal Status: Active Patient's osteomyelitis will resolve Date Initiated: 08/08/2016 Target Resolution Date: 11/10/2016 Goal Status: Active Signs and symptoms for osteomyelitis will be recognized and promptly addressed Date Initiated: 08/08/2016 Target Resolution Date: 11/10/2016 Goal Status: Active Interventions: Assess for signs and symptoms of osteomyelitis resolution every visit Provide education on osteomyelitis Screen for HBO Notes: ` Pain, Acute or Chronic DANNIELA, MCBREARTY ([id number]) Nursing Diagnoses: Pain, acute or chronic: actual or potential Potential alteration in comfort, pain Goals: Patient will verbalize adequate pain control and receive pain control interventions during procedures as needed Date Initiated: 08/08/2016 Target Resolution Date: 11/10/2016 Goal Status: Active Patient/caregiver will verbalize adequate pain control between visits Date Initiated: 08/08/2016 Target Resolution Date: 11/10/2016 Goal Status: Active Patient/caregiver will verbalize comfort level met Date Initiated: 08/08/2016 Target Resolution Date: 11/10/2016 Goal Status: Active Interventions: Assess comfort goal upon admission Complete pain assessment as per visit requirements Encourage patient to take pain medications as prescribed Implement pain control techniques (non-pharmaceutical) Provide education on pain management Provision of support: recognize patient pain, provide comfort and support as needed Treatment Activities: Administer pain control measures as ordered : 08/08/2016 Notes: ` Peripheral Neuropathy Nursing Diagnoses: Knowledge deficit related to disease process and management of peripheral neurovascular  dysfunction Potential alteration in peripheral tissue perfusion (select prior to confirmation of diagnosis) Goals: Patient/caregiver will verbalize understanding of disease process and disease management Date Initiated: 08/08/2016 Target Resolution Date: 11/10/2016 Goal Status: Active Interventions: Assess signs and symptoms of neuropathy upon admission and as needed Provide education on Management of Neuropathy and Related Ulcers Provide education on Management of Neuropathy upon discharge from the [facility] ([id number]) Notes: ` Wound/Skin Impairment Nursing Diagnoses: Impaired tissue integrity Knowledge deficit related to ulceration/compromised  Multi-Disciplinary Care Plan Details Patient Name: Shannon [name], Shannon [name] Date of Service: 08/08/2016 12:45 PM Medical Record Patient Account Number: [id number] Number: Treating RN: Baruch Gouty, RN, BSN, Rita [birth year] [phone] y.o. Other Clinician: Date of  Birth/Sex: Female) Treating ROBSON, Rollins Primary Care Cristofer Yaffe: Eulas Post, MONICA Manaal Mandala/Extender: G Referring Prachi Oftedahl: Alyson Ingles in Treatment: 0 Active Inactive ` Nutrition Nursing Diagnoses: Imbalanced nutrition Impaired glucose control: actual or potential Potential for alteratiion in Nutrition/Potential for imbalanced nutrition Goals: Patient/caregiver agrees to and verbalizes understanding of need to obtain nutritional consultation Date Initiated: 08/08/2016 Target Resolution Date: 11/08/2016 Goal Status: Active Patient/caregiver agrees to and verbalizes understanding of need to use nutritional supplements and/or vitamins as prescribed Date Initiated: 08/08/2016 Target Resolution Date: 11/08/2016 Goal Status: Active Patient/caregiver verbalizes understanding of need to maintain therapeutic glucose control per primary care physician Date Initiated: 08/08/2016 Target Resolution Date: 11/08/2016 Goal Status: Active Patient/caregiver will maintain therapeutic glucose control Date Initiated: 08/08/2016 Target Resolution Date: 11/08/2016 Goal Status: Active Interventions: Assess patient nutrition upon admission and as needed per policy Provide education on elevated blood sugars and impact on wound healing Provide education on nutrition Treatment Activities: Obtain HgA1c : 08/08/2016 Notes: ROSABELL, GEYER ([id number]) Orientation to the Wound Care Program Nursing Diagnoses: Knowledge deficit related to the wound healing center program Goals: Patient/caregiver will verbalize understanding of the [facility] Date Initiated: 08/08/2016 Target Resolution Date: 11/10/2016 Goal Status: Active Interventions: Provide education on orientation to the wound center Notes: ` Osteomyelitis Nursing Diagnoses: Infection: osteomyelitis Knowledge deficit related to disease process and management Potential for infection: osteomyelitis Goals: Diagnostic evaluation for  osteomyelitis completed as ordered Date Initiated: 08/08/2016 Target Resolution Date: 11/10/2016 Goal Status: Active Patient/caregiver will verbalize understanding of disease process and disease management Date Initiated: 08/08/2016 Target Resolution Date: 11/10/2016 Goal Status: Active Patient's osteomyelitis will resolve Date Initiated: 08/08/2016 Target Resolution Date: 11/10/2016 Goal Status: Active Signs and symptoms for osteomyelitis will be recognized and promptly addressed Date Initiated: 08/08/2016 Target Resolution Date: 11/10/2016 Goal Status: Active Interventions: Assess for signs and symptoms of osteomyelitis resolution every visit Provide education on osteomyelitis Screen for HBO Notes: ` Pain, Acute or Chronic DANNIELA, MCBREARTY ([id number]) Nursing Diagnoses: Pain, acute or chronic: actual or potential Potential alteration in comfort, pain Goals: Patient will verbalize adequate pain control and receive pain control interventions during procedures as needed Date Initiated: 08/08/2016 Target Resolution Date: 11/10/2016 Goal Status: Active Patient/caregiver will verbalize adequate pain control between visits Date Initiated: 08/08/2016 Target Resolution Date: 11/10/2016 Goal Status: Active Patient/caregiver will verbalize comfort level met Date Initiated: 08/08/2016 Target Resolution Date: 11/10/2016 Goal Status: Active Interventions: Assess comfort goal upon admission Complete pain assessment as per visit requirements Encourage patient to take pain medications as prescribed Implement pain control techniques (non-pharmaceutical) Provide education on pain management Provision of support: recognize patient pain, provide comfort and support as needed Treatment Activities: Administer pain control measures as ordered : 08/08/2016 Notes: ` Peripheral Neuropathy Nursing Diagnoses: Knowledge deficit related to disease process and management of peripheral neurovascular  dysfunction Potential alteration in peripheral tissue perfusion (select prior to confirmation of diagnosis) Goals: Patient/caregiver will verbalize understanding of disease process and disease management Date Initiated: 08/08/2016 Target Resolution Date: 11/10/2016 Goal Status: Active Interventions: Assess signs and symptoms of neuropathy upon admission and as needed Provide education on Management of Neuropathy and Related Ulcers Provide education on Management of Neuropathy upon discharge from the [facility] ([id number]) Notes: ` Wound/Skin Impairment Nursing Diagnoses: Impaired tissue integrity Knowledge deficit related to ulceration/compromised

## 2016-08-09 ENCOUNTER — Other Ambulatory Visit: Payer: Self-pay | Admitting: Physician Assistant

## 2016-08-09 NOTE — Progress Notes (Signed)
Shannon [name], Shannon [name] ([id number]) Visit Report for 08/08/2016 Abuse/Suicide Risk Screen Details Patient Name: Shannon [name], Shannon [name] Date of Service: 08/08/2016 12:45 PM Medical Record Patient Account Number: [id number] Number: Treating RN: Clover Mealy, RN, BSN, Rita 05-25-1923 [phone] y.o. Other Clinician: Date of Birth/Sex: Female) Treating ROBSON, MICHAEL Primary Care Adira Limburg: Montez Morita, MONICA Noelene Gang/Extender: G Referring Shareece Bultman: Gala Lewandowsky Weeks in Treatment: 0 Abuse/Suicide Risk Screen Items Answer ABUSE/SUICIDE RISK SCREEN: Has anyone close to you tried to hurt or harm you recentlyo No Do you feel uncomfortable with anyone in your familyo No Has anyone forced you do things that you didnot want to doo No Do you have any thoughts of harming yourselfo No Patient displays signs or symptoms of abuse and/or neglect. No Electronic Signature(s) Signed: 08/08/2016 5:27:57 PM By: Elpidio Eric BSN, RN Entered By: Elpidio Eric on 08/08/2016 13:15:38 Shannon [name] ([id number]) -------------------------------------------------------------------------------- Activities of Daily Living Details Patient Name: Shannon [name] Date of Service: 08/08/2016 12:45 PM Medical Record Patient Account Number: [id number] Number: Treating RN: Afful, RN, BSN, Rita 1924/01/29 [phone] y.o. Other Clinician: Date of Birth/Sex: Female) Treating ROBSON, MICHAEL Primary Care Lamonda Noxon: Montez Morita, MONICA Fiorela Pelzer/Extender: G Referring Pieper Kasik: Gala Lewandowsky Weeks in Treatment: 0 Activities of Daily Living Items Answer Activities of Daily Living (Please select one for each item) Drive Automobile Not Able Take Medications Completely Able Use Telephone Completely Able Care for Appearance Completely Able Use Toilet Completely Able Bath / Shower Completely Able Dress Self Completely Able Feed Self Completely Able Walk Completely Able Get In / Out Bed Completely Able Housework Completely Able Prepare Meals Completely  Able Handle Money Completely Able Shop for Self Need Assistance Electronic Signature(s) Signed: 08/08/2016 5:27:57 PM By: Elpidio Eric BSN, RN Entered By: Elpidio Eric on 08/08/2016 13:19:36 Shannon [name] ([id number]) -------------------------------------------------------------------------------- Education Assessment Details Patient Name: Shannon [name] Date of Service: 08/08/2016 12:45 PM Medical Record Patient Account Number: [id number] Number: Treating RN: Clover Mealy, RN, BSN, Rita May 09, 1923 (83 y.o. Other Clinician: Date of Birth/Sex: Female) Treating ROBSON, MICHAEL Primary Care Kyliyah Stirn: Montez Morita, MONICA Tirth Cothron/Extender: G Referring [name]: Geryl Councilman in Treatment: 0 Primary Learner Assessed: Patient Learning Preferences/Education Level/Primary Language Learning Preference: Explanation Highest Education Level: Grade School Preferred Language: English Cognitive Barrier Assessment/Beliefs Language Barrier: No Physical Barrier Assessment Impaired Vision: Yes Glasses Impaired Hearing: No Decreased Hand dexterity: No Knowledge/Comprehension Assessment Knowledge Level: Medium Comprehension Level: Medium Ability to understand written Medium instructions: Ability to understand verbal Medium instructions: Motivation Assessment Anxiety Level: Calm Cooperation: Cooperative Education Importance: Acknowledges Need Interest in Health Problems: Asks Questions Perception: Coherent Willingness to Engage in Self- Medium Management Activities: Readiness to Engage in Self- Medium Management Activities: Electronic Signature(s) Signed: 08/08/2016 5:27:57 PM By: Elpidio Eric BSN, RN Entered By: Elpidio Eric on 08/08/2016 13:18:56 Shannon [name] ([id number]) Shannon [name], Shannon [name] ([id number]) -------------------------------------------------------------------------------- Fall Risk Assessment Details Patient Name: Shannon [name] Date of Service: 08/08/2016 12:45 PM Medical Record Patient  Account Number: [id number] Number: Treating RN: Clover Mealy, RN, BSN, Rita 07/05/1923 [phone] y.o. Other Clinician: Date of Birth/Sex: Female) Treating ROBSON, MICHAEL Primary Care Tameron Lama: Montez Morita, MONICA Kawika Bischoff/Extender: G Referring Jancarlos Thrun: Geryl Councilman in Treatment: 0 Fall Risk Assessment Items Have you had 2 or more falls in the last 12 monthso 0 No Have you had any fall that resulted in injury in the last 12 monthso 0 No FALL RISK ASSESSMENT: History of falling - immediate or within 3 months 25 Yes Secondary diagnosis 0 No Ambulatory aid None/bed rest/wheelchair/nurse 0 No Crutches/cane/walker 15 Yes Furniture 0 No IV Access/Saline Lock 0 No  Gait/Training Normal/bed rest/immobile 0 No Weak 10 Yes Impaired 0 No Mental Status Oriented to own ability 0 Yes Electronic Signature(s) Signed: 08/08/2016 5:27:57 PM By: Elpidio Eric BSN, RN Entered By: Elpidio Eric on 08/08/2016 13:15:55 Shannon [name] ([id number]) -------------------------------------------------------------------------------- Foot Assessment Details Patient Name: Shannon [name] Date of Service: 08/08/2016 12:45 PM Medical Record Patient Account Number: [id number] Number: Treating RN: Clover Mealy, RN, BSN, Rita 09-Oct-1923 (83 y.o. Other Clinician: Date of Birth/Sex: Female) Treating ROBSON, MICHAEL Primary Care Daven Pinckney: Montez Morita, MONICA Shatisha Falter/Extender: G Referring Toney Lizaola: Gala Lewandowsky Weeks in Treatment: 0 Foot Assessment Items Site Locations + = Sensation present, - = Sensation absent, C = Callus, U = Ulcer R = Redness, W = Warmth, M = Maceration, PU = Pre-ulcerative lesion F = Fissure, S = Swelling, D = Dryness Assessment Right: Left: Other Deformity: No No Prior Foot Ulcer: No No Prior Amputation: No No Charcot Joint: No No Ambulatory Status: Ambulatory With Help Assistance Device: Walker Gait: Surveyor, mining) Signed: 08/08/2016 5:27:57 PM By: Elpidio Eric BSN, RN Entered  By: Elpidio Eric on 08/08/2016 13:18:13 Shannon [name] ([id number]) Shannon [name] ([id number]) -------------------------------------------------------------------------------- Nutrition Risk Assessment Details Patient Name: Shannon [name] Date of Service: 08/08/2016 12:45 PM Medical Record Patient Account Number: [id number] Number: Treating RN: Clover Mealy, RN, BSN, Rita 11-05-1923 (83 y.o. Other Clinician: Date of Birth/Sex: Female) Treating ROBSON, MICHAEL Primary Care Zeth Buday: Montez Morita, MONICA Monica Zahler/Extender: G Referring Shanna Un: Gala Lewandowsky Weeks in Treatment: 0 Height (in): Weight (lbs): Body Mass Index (BMI): Nutrition Risk Assessment Items NUTRITION RISK SCREEN: I have an illness or condition that made me change the kind and/or 0 No amount of food I eat I eat fewer than two meals per day 0 No I eat few fruits and vegetables, or milk products 0 No I have three or more drinks of beer, liquor or wine almost every day 0 No I have tooth or mouth problems that make it hard for me to eat 0 No I don't always have enough money to buy the food I need 0 No I eat alone most of the time 0 No I take three or more different prescribed or over-the-counter drugs a 0 No day Without wanting to, I have lost or gained 10 pounds in the last six 0 No months I am not always physically able to shop, cook and/or feed myself 2 Yes Nutrition Protocols Good Risk Protocol 0 No interventions needed Moderate Risk Protocol Electronic Signature(s) Signed: 08/08/2016 5:27:57 PM By: Elpidio Eric BSN, RN Entered By: Elpidio Eric on 08/08/2016 13:16:18

## 2016-08-10 ENCOUNTER — Telehealth: Payer: Self-pay

## 2016-08-10 ENCOUNTER — Other Ambulatory Visit: Payer: Self-pay | Admitting: Nurse Practitioner

## 2016-08-10 DIAGNOSIS — M86179 Other acute osteomyelitis, unspecified ankle and foot: Secondary | ICD-10-CM

## 2016-08-10 NOTE — Progress Notes (Addendum)
PM Version By: Linton Ham MD Entered By: Regan Lemming on 08/11/2016 16:09:30 Shannon [name] ([id number]) -------------------------------------------------------------------------------- Problem List Details Patient Name: Shannon [name] Date of Service: 08/08/2016 12:45 PM Medical Record Patient Account Number: [id number] Number: Treating RN: Baruch Gouty, RN, BSN, Rita 01-06-1924 [phone] y.o. 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 Other Clinician: Date of Birth/Sex: Female) Treating Areg Bialas Primary Care Provider: Eulas Post, MONICA Provider/Extender: G Referring Provider: Gildardo Cranker Weeks in Treatment: 0 Subjective Chief Complaint Information obtained from Patient 08/08/16; patient is here for review of wound of the left 2nd toe, left first toe and a small area of the right first toe associated with severe PAD History of Present Illness (HPI) 08/08/16; this is a 83 year old women who arrives in our clinic referred from her podiatrist Dr. Amalia Hailey. She is a type 2 diabetic with PAD who recently came to the attention of vein and vascular in [place]. She presented with a black toe on the left foot. She was admitted to hospital with this from 07/28/16-08/01/16. MRI of the foot suggested a tiny subcutaneous abscess at the base of the second proximal phalynx suggested osteomyelitis. She did not wish to consider amputation or angiography. She received vanc and cephapime in the hospital discharged on clindamycin for a month. She is on Coumadin. Stage 3 crf, Previous non invasive arterial studies showed diffuse plaque with circumferential calcified vessels. monophasic waveforms in the superficial femoral artery and below. Patient is in moderate pain. They are only using dial soaks. and gauze. Wound History Patient presents with 3 open wounds that have been present for approximately 4 weeks. Patient has been treating wounds in the following manner: dry dsg. Laboratory tests have not been performed in the last month. Patient reportedly has not tested positive for an antibiotic resistant organism. Patient reportedly has tested positive for osteomyelitis. Patient reportedly has had testing performed to evaluate circulation in the legs. Patient experiences the following problems associated with their wounds: infection, swelling. Patient  History Information obtained from Patient, Chart. Allergies PCN Family History Diabetes - Siblings, Paternal Grandparents, Maternal Grandparents, Heart Disease - Siblings, Paternal Grandparents, Hypertension - Siblings, Lung Disease - Siblings, Maternal Grandparents, Stroke - Siblings, Thyroid Problems - Siblings, LORYNN, MOESER ([id number]) No family history of Cancer, Hereditary Spherocytosis, Kidney Disease, Seizures, Tuberculosis. Social History Never smoker, Marital Status - Widowed, Alcohol Use - Never, Drug Use - No History, Caffeine Use - Moderate. Medical History Eyes Patient has history of Cataracts Ear/Nose/Mouth/Throat Denies history of Chronic sinus problems/congestion, Middle ear problems Hematologic/Lymphatic Patient has history of Anemia Respiratory Patient has history of Asthma, Chronic Obstructive Pulmonary Disease (COPD) Cardiovascular Patient has history of Arrhythmia, Congestive Heart Failure, Coronary Artery Disease, Deep Vein Thrombosis, Hypertension Endocrine Patient has history of Type II  pt requires aid of supportive devices such as crutches, cane, wheelchairs, walkers, the use of special transportation or the assistance of another person to leave their place of residence. There is a normal inability to leave the home and doing so requires considerable and taxing effort. Other absences are for  medical reasons / religious services and are infrequent or of short duration when for other reasons). If current dressing causes regression in wound condition, may D/C ordered dressing product/s and apply Normal Saline Moist Dressing daily until next Dent / Other MD appointment. [place] of regression in wound condition at [phone]. Please direct any NON-WOUND related issues/requests for orders to patient's Primary Care Physician Medications-please add to medication list.: Wound #1 Right,Medial Toe Great: P.O. Antibiotics - Continue taking Cleocin 300mg  as given by ER/PCP Wound #2 Left,Circumferential Toe Second: P.O. Antibiotics - Continue taking Cleocin 300mg  as given by ER/PCP Wound #3 Left,Medial Toe Great: P.O. Antibiotics - Continue taking Cleocin 300mg  as given by ER/PCP #1 gangrene of the left foot specifically involving the left second toe which is mummified probably on its way to involving the left first toe and the medial aspect of the left foot. The patient is in some pain but is not specifically unstable at the moment. We applied silver alginate and Kerlix to all of this area. They can continue to wash this with Dial soap. #2 to the tip of the right first toe we also applied silver alginate and Kerlix #3 with regards to the decisions for this patient I outlined to both the patient and the daughter how I look at this type of situation. This comes from years of dealing with frail people in similar situations. Although I understand the patient's desire to not be aggressive and certainly respect this if this is ultimately her decision, I generally don't favor this type of approach. First of all this will ultimately become more painful and then no be further tissue breakdown which will be difficult to watch leading to failure to thrive and ultimately probably a septic picture. If this is a decision that I think hospice should be involved. I think  an attempt at revascularization and if this is not possible or not successful then amputation is a better option. I outlined this. Her daughter was very receptive but made it clear it was her mother's Warden/ranger) Signed: 08/17/2016 10:23:21 AM By: Gretta Cool, RN, BSN, Kim RN, BSN [place], [place] ([id number]) Signed: 08/29/2016 7:46:50 AM By: Linton Ham MD Previous Signature: 08/09/2016 12:30:45 PM Version By: Linton Ham MD Entered By: Gretta Cool RN, BSN, Kim on 08/17/2016 10:23:21 SERRINA, MINOGUE ([id number]) -------------------------------------------------------------------------------- ROS/PFSH Details Patient Name: Shannon [name] Date of Service: 08/08/2016 12:45 PM Medical Record Patient Account Number: [id number] Number: Treating RN: Baruch Gouty, RN, BSN, Rita May 26, 1923 [phone] y.o. Other Clinician: Date of Birth/Sex: Female) Treating Vernie Vinciguerra Primary Care Provider: Eulas Post, MONICA Provider/Extender: G Referring Provider: Eulas Post, MONICA Weeks in Treatment: 0 Information Obtained From Patient Chart Wound History Do you currently have one or more open woundso Yes How many open wounds do you currently haveo 3 Approximately how long have you had your woundso 4 weeks How have you been treating your wound(s) until nowo dry dsg Has your wound(s) ever healed and then re-openedo No Have you had any lab work done in the past montho No Have you tested positive for an antibiotic resistant organism (MRSA, VRE)o No Have you tested positive for osteomyelitis (bone infection)o Yes Have you had any tests  for circulation on your legso Yes Have you had other problems associated with your woundso Infection, Swelling Eyes Complaints and Symptoms: Positive for: Vision Changes; Glasses / Contacts Medical History: Positive for: Cataracts Hematologic/Lymphatic Complaints and Symptoms: Positive for: Bleeding / Clotting Disorders Medical History: Positive for:  Anemia Respiratory Complaints and Symptoms: Positive for: Shortness of Breath Medical History: Positive for: Asthma; Chronic Obstructive Pulmonary Disease (COPD) Cardiovascular RENDI, MAPEL ([id number]) Complaints and Symptoms: Positive for: LE edema Medical History: Positive for: Arrhythmia; Congestive Heart Failure; Coronary Artery Disease; Deep Vein Thrombosis; Hypertension Integumentary (Skin) Complaints and Symptoms: Positive for: Wounds; Breakdown; Swelling Constitutional Symptoms (General Health) Complaints and Symptoms: No Complaints or Symptoms Ear/Nose/Mouth/Throat Complaints and Symptoms: No Complaints or Symptoms Medical History: Negative for: Chronic sinus problems/congestion; Middle ear problems Gastrointestinal Complaints and Symptoms: No Complaints or Symptoms Medical History: Past Medical History Notes: GERD, Small bowel obstruction ,Paraesophageal hernia o o Perforated gastric ulcer (HCC)History of stomach ulcers, Gastric stenosis o o a. s/p stomach tube Endocrine Complaints and Symptoms: No Complaints or Symptoms Medical History: Positive for: Type II Diabetes Time with diabetes: years Treated with: Insulin Blood sugar tested every day: Yes Tested : Blood sugar testing results: Bedtime: [address] STALEY, BUDZINSKI ([id number]) Complaints and Symptoms: No Complaints or Symptoms Medical History: Negative for: End Stage Renal Disease Immunological Complaints and Symptoms: No Complaints or Symptoms Medical History: Negative for: Lupus Erythematosus; Raynaudos; Scleroderma Musculoskeletal Complaints and Symptoms: No Complaints or Symptoms Medical History: Positive for: Osteoarthritis; Osteomyelitis Neurologic Complaints and Symptoms: No Complaints or Symptoms Medical History: Positive for: Neuropathy Oncologic Complaints and Symptoms: No Complaints or Symptoms Medical History: Negative for: Received Chemotherapy; Received  Radiation Psychiatric Complaints and Symptoms: No Complaints or Symptoms Medical History: Negative for: Anorexia/bulimia; Confinement Anxiety HBO Extended History Items Eyes: Cataracts Immunizations KEIRY, KOWAL ([id number]) Pneumococcal Vaccine: Received Pneumococcal Vaccination: Yes Family and Social History Cancer: No; Diabetes: Yes - Siblings, Paternal Grandparents, Maternal Grandparents; Heart Disease: Yes - Siblings, Paternal Grandparents; Hereditary Spherocytosis: No; Hypertension: Yes - Siblings; Kidney Disease: No; Lung Disease: Yes - Siblings, Maternal Grandparents; Seizures: No; Stroke: Yes - Siblings; Thyroid Problems: Yes - Siblings; Tuberculosis: No; Never smoker; Marital Status - Widowed; Alcohol Use: Never; Drug Use: No History; Caffeine Use: Moderate; Financial Concerns: No; Food, Clothing or Shelter Needs: No; Support System Lacking: No; Transportation Concerns: No; Advanced Directives: Yes (Not Provided); Patient does not want information on Advanced Directives; Living Will: Yes (Not Provided) Electronic Signature(s) Signed: 08/08/2016 5:27:57 PM By: Regan Lemming BSN, RN Signed: 08/09/2016 12:30:45 PM By: Linton Ham MD Entered By: Regan Lemming on 08/08/2016 13:51:50 Shannon [name] ([id number]) -------------------------------------------------------------------------------- SuperBill Details Patient Name: Shannon [name] Date of Service: 08/08/2016 Medical Record Patient Account Number: [id number] Number: Treating RN: Baruch Gouty, RN, BSN, Rita 30-Jul-1923 [phone] y.o. 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 with necrosis of  muscle L97.511 Non-pressure chronic ulcer of other part of right foot limited to breakdown of skin Facility Procedures CPT4 Code: 65035465 Description: 68127 - WOUND CARE VISIT-LEV 5 EST PT Modifier: Quantity: 1 Physician Procedures CPT4: Description Modifier Quantity Code 5170017 49449 - WC PHYS LEVEL 4 - NEW PT 1 ICD-10 Description Diagnosis E11.52 Type 2 diabetes mellitus with diabetic peripheral angiopathy with gangrene L97.511 Non-pressure chronic ulcer of other  pt requires aid of supportive devices such as crutches, cane, wheelchairs, walkers, the use of special transportation or the assistance of another person to leave their place of residence. There is a normal inability to leave the home and doing so requires considerable and taxing effort. Other absences are for  medical reasons / religious services and are infrequent or of short duration when for other reasons). If current dressing causes regression in wound condition, may D/C ordered dressing product/s and apply Normal Saline Moist Dressing daily until next Dent / Other MD appointment. [place] of regression in wound condition at [phone]. Please direct any NON-WOUND related issues/requests for orders to patient's Primary Care Physician Medications-please add to medication list.: Wound #1 Right,Medial Toe Great: P.O. Antibiotics - Continue taking Cleocin 300mg  as given by ER/PCP Wound #2 Left,Circumferential Toe Second: P.O. Antibiotics - Continue taking Cleocin 300mg  as given by ER/PCP Wound #3 Left,Medial Toe Great: P.O. Antibiotics - Continue taking Cleocin 300mg  as given by ER/PCP #1 gangrene of the left foot specifically involving the left second toe which is mummified probably on its way to involving the left first toe and the medial aspect of the left foot. The patient is in some pain but is not specifically unstable at the moment. We applied silver alginate and Kerlix to all of this area. They can continue to wash this with Dial soap. #2 to the tip of the right first toe we also applied silver alginate and Kerlix #3 with regards to the decisions for this patient I outlined to both the patient and the daughter how I look at this type of situation. This comes from years of dealing with frail people in similar situations. Although I understand the patient's desire to not be aggressive and certainly respect this if this is ultimately her decision, I generally don't favor this type of approach. First of all this will ultimately become more painful and then no be further tissue breakdown which will be difficult to watch leading to failure to thrive and ultimately probably a septic picture. If this is a decision that I think hospice should be involved. I think  an attempt at revascularization and if this is not possible or not successful then amputation is a better option. I outlined this. Her daughter was very receptive but made it clear it was her mother's Warden/ranger) Signed: 08/17/2016 10:23:21 AM By: Gretta Cool, RN, BSN, Kim RN, BSN [place], [place] ([id number]) Signed: 08/29/2016 7:46:50 AM By: Linton Ham MD Previous Signature: 08/09/2016 12:30:45 PM Version By: Linton Ham MD Entered By: Gretta Cool RN, BSN, Kim on 08/17/2016 10:23:21 SERRINA, MINOGUE ([id number]) -------------------------------------------------------------------------------- ROS/PFSH Details Patient Name: Shannon [name] Date of Service: 08/08/2016 12:45 PM Medical Record Patient Account Number: [id number] Number: Treating RN: Baruch Gouty, RN, BSN, Rita May 26, 1923 [phone] y.o. Other Clinician: Date of Birth/Sex: Female) Treating Vernie Vinciguerra Primary Care Provider: Eulas Post, MONICA Provider/Extender: G Referring Provider: Eulas Post, MONICA Weeks in Treatment: 0 Information Obtained From Patient Chart Wound History Do you currently have one or more open woundso Yes How many open wounds do you currently haveo 3 Approximately how long have you had your woundso 4 weeks How have you been treating your wound(s) until nowo dry dsg Has your wound(s) ever healed and then re-openedo No Have you had any lab work done in the past montho No Have you tested positive for an antibiotic resistant organism (MRSA, VRE)o No Have you tested positive for osteomyelitis (bone infection)o Yes Have you had any tests  pt requires aid of supportive devices such as crutches, cane, wheelchairs, walkers, the use of special transportation or the assistance of another person to leave their place of residence. There is a normal inability to leave the home and doing so requires considerable and taxing effort. Other absences are for  medical reasons / religious services and are infrequent or of short duration when for other reasons). If current dressing causes regression in wound condition, may D/C ordered dressing product/s and apply Normal Saline Moist Dressing daily until next Dent / Other MD appointment. [place] of regression in wound condition at [phone]. Please direct any NON-WOUND related issues/requests for orders to patient's Primary Care Physician Medications-please add to medication list.: Wound #1 Right,Medial Toe Great: P.O. Antibiotics - Continue taking Cleocin 300mg  as given by ER/PCP Wound #2 Left,Circumferential Toe Second: P.O. Antibiotics - Continue taking Cleocin 300mg  as given by ER/PCP Wound #3 Left,Medial Toe Great: P.O. Antibiotics - Continue taking Cleocin 300mg  as given by ER/PCP #1 gangrene of the left foot specifically involving the left second toe which is mummified probably on its way to involving the left first toe and the medial aspect of the left foot. The patient is in some pain but is not specifically unstable at the moment. We applied silver alginate and Kerlix to all of this area. They can continue to wash this with Dial soap. #2 to the tip of the right first toe we also applied silver alginate and Kerlix #3 with regards to the decisions for this patient I outlined to both the patient and the daughter how I look at this type of situation. This comes from years of dealing with frail people in similar situations. Although I understand the patient's desire to not be aggressive and certainly respect this if this is ultimately her decision, I generally don't favor this type of approach. First of all this will ultimately become more painful and then no be further tissue breakdown which will be difficult to watch leading to failure to thrive and ultimately probably a septic picture. If this is a decision that I think hospice should be involved. I think  an attempt at revascularization and if this is not possible or not successful then amputation is a better option. I outlined this. Her daughter was very receptive but made it clear it was her mother's Warden/ranger) Signed: 08/17/2016 10:23:21 AM By: Gretta Cool, RN, BSN, Kim RN, BSN [place], [place] ([id number]) Signed: 08/29/2016 7:46:50 AM By: Linton Ham MD Previous Signature: 08/09/2016 12:30:45 PM Version By: Linton Ham MD Entered By: Gretta Cool RN, BSN, Kim on 08/17/2016 10:23:21 SERRINA, MINOGUE ([id number]) -------------------------------------------------------------------------------- ROS/PFSH Details Patient Name: Shannon [name] Date of Service: 08/08/2016 12:45 PM Medical Record Patient Account Number: [id number] Number: Treating RN: Baruch Gouty, RN, BSN, Rita May 26, 1923 [phone] y.o. Other Clinician: Date of Birth/Sex: Female) Treating Vernie Vinciguerra Primary Care Provider: Eulas Post, MONICA Provider/Extender: G Referring Provider: Eulas Post, MONICA Weeks in Treatment: 0 Information Obtained From Patient Chart Wound History Do you currently have one or more open woundso Yes How many open wounds do you currently haveo 3 Approximately how long have you had your woundso 4 weeks How have you been treating your wound(s) until nowo dry dsg Has your wound(s) ever healed and then re-openedo No Have you had any lab work done in the past montho No Have you tested positive for an antibiotic resistant organism (MRSA, VRE)o No Have you tested positive for osteomyelitis (bone infection)o Yes Have you had any tests  PM Version By: Linton Ham MD Entered By: Regan Lemming on 08/11/2016 16:09:30 Shannon [name] ([id number]) -------------------------------------------------------------------------------- Problem List Details Patient Name: Shannon [name] Date of Service: 08/08/2016 12:45 PM Medical Record Patient Account Number: [id number] Number: Treating RN: Baruch Gouty, RN, BSN, Rita 01-06-1924 [phone] y.o. 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 Other Clinician: Date of Birth/Sex: Female) Treating Areg Bialas Primary Care Provider: Eulas Post, MONICA Provider/Extender: G Referring Provider: Gildardo Cranker Weeks in Treatment: 0 Subjective Chief Complaint Information obtained from Patient 08/08/16; patient is here for review of wound of the left 2nd toe, left first toe and a small area of the right first toe associated with severe PAD History of Present Illness (HPI) 08/08/16; this is a 83 year old women who arrives in our clinic referred from her podiatrist Dr. Amalia Hailey. She is a type 2 diabetic with PAD who recently came to the attention of vein and vascular in [place]. She presented with a black toe on the left foot. She was admitted to hospital with this from 07/28/16-08/01/16. MRI of the foot suggested a tiny subcutaneous abscess at the base of the second proximal phalynx suggested osteomyelitis. She did not wish to consider amputation or angiography. She received vanc and cephapime in the hospital discharged on clindamycin for a month. She is on Coumadin. Stage 3 crf, Previous non invasive arterial studies showed diffuse plaque with circumferential calcified vessels. monophasic waveforms in the superficial femoral artery and below. Patient is in moderate pain. They are only using dial soaks. and gauze. Wound History Patient presents with 3 open wounds that have been present for approximately 4 weeks. Patient has been treating wounds in the following manner: dry dsg. Laboratory tests have not been performed in the last month. Patient reportedly has not tested positive for an antibiotic resistant organism. Patient reportedly has tested positive for osteomyelitis. Patient reportedly has had testing performed to evaluate circulation in the legs. Patient experiences the following problems associated with their wounds: infection, swelling. Patient  History Information obtained from Patient, Chart. Allergies PCN Family History Diabetes - Siblings, Paternal Grandparents, Maternal Grandparents, Heart Disease - Siblings, Paternal Grandparents, Hypertension - Siblings, Lung Disease - Siblings, Maternal Grandparents, Stroke - Siblings, Thyroid Problems - Siblings, LORYNN, MOESER ([id number]) No family history of Cancer, Hereditary Spherocytosis, Kidney Disease, Seizures, Tuberculosis. Social History Never smoker, Marital Status - Widowed, Alcohol Use - Never, Drug Use - No History, Caffeine Use - Moderate. Medical History Eyes Patient has history of Cataracts Ear/Nose/Mouth/Throat Denies history of Chronic sinus problems/congestion, Middle ear problems Hematologic/Lymphatic Patient has history of Anemia Respiratory Patient has history of Asthma, Chronic Obstructive Pulmonary Disease (COPD) Cardiovascular Patient has history of Arrhythmia, Congestive Heart Failure, Coronary Artery Disease, Deep Vein Thrombosis, Hypertension Endocrine Patient has history of Type II  PM Version By: Linton Ham MD Entered By: Regan Lemming on 08/11/2016 16:09:30 Shannon [name] ([id number]) -------------------------------------------------------------------------------- Problem List Details Patient Name: Shannon [name] Date of Service: 08/08/2016 12:45 PM Medical Record Patient Account Number: [id number] Number: Treating RN: Baruch Gouty, RN, BSN, Rita 01-06-1924 [phone] y.o. 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 Other Clinician: Date of Birth/Sex: Female) Treating Areg Bialas Primary Care Provider: Eulas Post, MONICA Provider/Extender: G Referring Provider: Gildardo Cranker Weeks in Treatment: 0 Subjective Chief Complaint Information obtained from Patient 08/08/16; patient is here for review of wound of the left 2nd toe, left first toe and a small area of the right first toe associated with severe PAD History of Present Illness (HPI) 08/08/16; this is a 83 year old women who arrives in our clinic referred from her podiatrist Dr. Amalia Hailey. She is a type 2 diabetic with PAD who recently came to the attention of vein and vascular in [place]. She presented with a black toe on the left foot. She was admitted to hospital with this from 07/28/16-08/01/16. MRI of the foot suggested a tiny subcutaneous abscess at the base of the second proximal phalynx suggested osteomyelitis. She did not wish to consider amputation or angiography. She received vanc and cephapime in the hospital discharged on clindamycin for a month. She is on Coumadin. Stage 3 crf, Previous non invasive arterial studies showed diffuse plaque with circumferential calcified vessels. monophasic waveforms in the superficial femoral artery and below. Patient is in moderate pain. They are only using dial soaks. and gauze. Wound History Patient presents with 3 open wounds that have been present for approximately 4 weeks. Patient has been treating wounds in the following manner: dry dsg. Laboratory tests have not been performed in the last month. Patient reportedly has not tested positive for an antibiotic resistant organism. Patient reportedly has tested positive for osteomyelitis. Patient reportedly has had testing performed to evaluate circulation in the legs. Patient experiences the following problems associated with their wounds: infection, swelling. Patient  History Information obtained from Patient, Chart. Allergies PCN Family History Diabetes - Siblings, Paternal Grandparents, Maternal Grandparents, Heart Disease - Siblings, Paternal Grandparents, Hypertension - Siblings, Lung Disease - Siblings, Maternal Grandparents, Stroke - Siblings, Thyroid Problems - Siblings, LORYNN, MOESER ([id number]) No family history of Cancer, Hereditary Spherocytosis, Kidney Disease, Seizures, Tuberculosis. Social History Never smoker, Marital Status - Widowed, Alcohol Use - Never, Drug Use - No History, Caffeine Use - Moderate. Medical History Eyes Patient has history of Cataracts Ear/Nose/Mouth/Throat Denies history of Chronic sinus problems/congestion, Middle ear problems Hematologic/Lymphatic Patient has history of Anemia Respiratory Patient has history of Asthma, Chronic Obstructive Pulmonary Disease (COPD) Cardiovascular Patient has history of Arrhythmia, Congestive Heart Failure, Coronary Artery Disease, Deep Vein Thrombosis, Hypertension Endocrine Patient has history of Type II  for circulation on your legso Yes Have you had other problems associated with your woundso Infection, Swelling Eyes Complaints and Symptoms: Positive for: Vision Changes; Glasses / Contacts Medical History: Positive for: Cataracts Hematologic/Lymphatic Complaints and Symptoms: Positive for: Bleeding / Clotting Disorders Medical History: Positive for:  Anemia Respiratory Complaints and Symptoms: Positive for: Shortness of Breath Medical History: Positive for: Asthma; Chronic Obstructive Pulmonary Disease (COPD) Cardiovascular RENDI, MAPEL ([id number]) Complaints and Symptoms: Positive for: LE edema Medical History: Positive for: Arrhythmia; Congestive Heart Failure; Coronary Artery Disease; Deep Vein Thrombosis; Hypertension Integumentary (Skin) Complaints and Symptoms: Positive for: Wounds; Breakdown; Swelling Constitutional Symptoms (General Health) Complaints and Symptoms: No Complaints or Symptoms Ear/Nose/Mouth/Throat Complaints and Symptoms: No Complaints or Symptoms Medical History: Negative for: Chronic sinus problems/congestion; Middle ear problems Gastrointestinal Complaints and Symptoms: No Complaints or Symptoms Medical History: Past Medical History Notes: GERD, Small bowel obstruction ,Paraesophageal hernia o o Perforated gastric ulcer (HCC)History of stomach ulcers, Gastric stenosis o o a. s/p stomach tube Endocrine Complaints and Symptoms: No Complaints or Symptoms Medical History: Positive for: Type II Diabetes Time with diabetes: years Treated with: Insulin Blood sugar tested every day: Yes Tested : Blood sugar testing results: Bedtime: [address] STALEY, BUDZINSKI ([id number]) Complaints and Symptoms: No Complaints or Symptoms Medical History: Negative for: End Stage Renal Disease Immunological Complaints and Symptoms: No Complaints or Symptoms Medical History: Negative for: Lupus Erythematosus; Raynaudos; Scleroderma Musculoskeletal Complaints and Symptoms: No Complaints or Symptoms Medical History: Positive for: Osteoarthritis; Osteomyelitis Neurologic Complaints and Symptoms: No Complaints or Symptoms Medical History: Positive for: Neuropathy Oncologic Complaints and Symptoms: No Complaints or Symptoms Medical History: Negative for: Received Chemotherapy; Received  Radiation Psychiatric Complaints and Symptoms: No Complaints or Symptoms Medical History: Negative for: Anorexia/bulimia; Confinement Anxiety HBO Extended History Items Eyes: Cataracts Immunizations KEIRY, KOWAL ([id number]) Pneumococcal Vaccine: Received Pneumococcal Vaccination: Yes Family and Social History Cancer: No; Diabetes: Yes - Siblings, Paternal Grandparents, Maternal Grandparents; Heart Disease: Yes - Siblings, Paternal Grandparents; Hereditary Spherocytosis: No; Hypertension: Yes - Siblings; Kidney Disease: No; Lung Disease: Yes - Siblings, Maternal Grandparents; Seizures: No; Stroke: Yes - Siblings; Thyroid Problems: Yes - Siblings; Tuberculosis: No; Never smoker; Marital Status - Widowed; Alcohol Use: Never; Drug Use: No History; Caffeine Use: Moderate; Financial Concerns: No; Food, Clothing or Shelter Needs: No; Support System Lacking: No; Transportation Concerns: No; Advanced Directives: Yes (Not Provided); Patient does not want information on Advanced Directives; Living Will: Yes (Not Provided) Electronic Signature(s) Signed: 08/08/2016 5:27:57 PM By: Regan Lemming BSN, RN Signed: 08/09/2016 12:30:45 PM By: Linton Ham MD Entered By: Regan Lemming on 08/08/2016 13:51:50 Shannon [name] ([id number]) -------------------------------------------------------------------------------- SuperBill Details Patient Name: Shannon [name] Date of Service: 08/08/2016 Medical Record Patient Account Number: [id number] Number: Treating RN: Baruch Gouty, RN, BSN, Rita 30-Jul-1923 [phone] y.o. 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 with necrosis of  muscle L97.511 Non-pressure chronic ulcer of other part of right foot limited to breakdown of skin Facility Procedures CPT4 Code: 65035465 Description: 68127 - WOUND CARE VISIT-LEV 5 EST PT Modifier: Quantity: 1 Physician Procedures CPT4: Description Modifier Quantity Code 5170017 [id number] - WC PHYS LEVEL 4 - NEW PT 1 ICD-10 Description Diagnosis E11.52 Type 2 diabetes mellitus with diabetic peripheral angiopathy with gangrene L97.511 Non-pressure chronic ulcer of other  pt requires aid of supportive devices such as crutches, cane, wheelchairs, walkers, the use of special transportation or the assistance of another person to leave their place of residence. There is a normal inability to leave the home and doing so requires considerable and taxing effort. Other absences are for  medical reasons / religious services and are infrequent or of short duration when for other reasons). If current dressing causes regression in wound condition, may D/C ordered dressing product/s and apply Normal Saline Moist Dressing daily until next Dent / Other MD appointment. [place] of regression in wound condition at [phone]. Please direct any NON-WOUND related issues/requests for orders to patient's Primary Care Physician Medications-please add to medication list.: Wound #1 Right,Medial Toe Great: P.O. Antibiotics - Continue taking Cleocin 300mg  as given by ER/PCP Wound #2 Left,Circumferential Toe Second: P.O. Antibiotics - Continue taking Cleocin 300mg  as given by ER/PCP Wound #3 Left,Medial Toe Great: P.O. Antibiotics - Continue taking Cleocin 300mg  as given by ER/PCP #1 gangrene of the left foot specifically involving the left second toe which is mummified probably on its way to involving the left first toe and the medial aspect of the left foot. The patient is in some pain but is not specifically unstable at the moment. We applied silver alginate and Kerlix to all of this area. They can continue to wash this with Dial soap. #2 to the tip of the right first toe we also applied silver alginate and Kerlix #3 with regards to the decisions for this patient I outlined to both the patient and the daughter how I look at this type of situation. This comes from years of dealing with frail people in similar situations. Although I understand the patient's desire to not be aggressive and certainly respect this if this is ultimately her decision, I generally don't favor this type of approach. First of all this will ultimately become more painful and then no be further tissue breakdown which will be difficult to watch leading to failure to thrive and ultimately probably a septic picture. If this is a decision that I think hospice should be involved. I think  an attempt at revascularization and if this is not possible or not successful then amputation is a better option. I outlined this. Her daughter was very receptive but made it clear it was her mother's Warden/ranger) Signed: 08/17/2016 10:23:21 AM By: Gretta Cool, RN, BSN, Kim RN, BSN [place], [place] ([id number]) Signed: 08/29/2016 7:46:50 AM By: Linton Ham MD Previous Signature: 08/09/2016 12:30:45 PM Version By: Linton Ham MD Entered By: Gretta Cool RN, BSN, Kim on 08/17/2016 10:23:21 SERRINA, MINOGUE ([id number]) -------------------------------------------------------------------------------- ROS/PFSH Details Patient Name: Shannon [name] Date of Service: 08/08/2016 12:45 PM Medical Record Patient Account Number: [id number] Number: Treating RN: Baruch Gouty, RN, BSN, Rita May 26, 1923 [phone] y.o. Other Clinician: Date of Birth/Sex: Female) Treating Vernie Vinciguerra Primary Care Provider: Eulas Post, MONICA Provider/Extender: G Referring Provider: Eulas Post, MONICA Weeks in Treatment: 0 Information Obtained From Patient Chart Wound History Do you currently have one or more open woundso Yes How many open wounds do you currently haveo 3 Approximately how long have you had your woundso 4 weeks How have you been treating your wound(s) until nowo dry dsg Has your wound(s) ever healed and then re-openedo No Have you had any lab work done in the past montho No Have you tested positive for an antibiotic resistant organism (MRSA, VRE)o No Have you tested positive for osteomyelitis (bone infection)o Yes Have you had any tests  for circulation on your legso Yes Have you had other problems associated with your woundso Infection, Swelling Eyes Complaints and Symptoms: Positive for: Vision Changes; Glasses / Contacts Medical History: Positive for: Cataracts Hematologic/Lymphatic Complaints and Symptoms: Positive for: Bleeding / Clotting Disorders Medical History: Positive for:  Anemia Respiratory Complaints and Symptoms: Positive for: Shortness of Breath Medical History: Positive for: Asthma; Chronic Obstructive Pulmonary Disease (COPD) Cardiovascular RENDI, MAPEL ([id number]) Complaints and Symptoms: Positive for: LE edema Medical History: Positive for: Arrhythmia; Congestive Heart Failure; Coronary Artery Disease; Deep Vein Thrombosis; Hypertension Integumentary (Skin) Complaints and Symptoms: Positive for: Wounds; Breakdown; Swelling Constitutional Symptoms (General Health) Complaints and Symptoms: No Complaints or Symptoms Ear/Nose/Mouth/Throat Complaints and Symptoms: No Complaints or Symptoms Medical History: Negative for: Chronic sinus problems/congestion; Middle ear problems Gastrointestinal Complaints and Symptoms: No Complaints or Symptoms Medical History: Past Medical History Notes: GERD, Small bowel obstruction ,Paraesophageal hernia o o Perforated gastric ulcer (HCC)History of stomach ulcers, Gastric stenosis o o a. s/p stomach tube Endocrine Complaints and Symptoms: No Complaints or Symptoms Medical History: Positive for: Type II Diabetes Time with diabetes: years Treated with: Insulin Blood sugar tested every day: Yes Tested : Blood sugar testing results: Bedtime: [address] [place], BUDZINSKI ([id number]) Complaints and Symptoms: No Complaints or Symptoms Medical History: Negative for: End Stage Renal Disease Immunological Complaints and Symptoms: No Complaints or Symptoms Medical History: Negative for: Lupus Erythematosus; Raynaudos; Scleroderma Musculoskeletal Complaints and Symptoms: No Complaints or Symptoms Medical History: Positive for: Osteoarthritis; Osteomyelitis Neurologic Complaints and Symptoms: No Complaints or Symptoms Medical History: Positive for: Neuropathy Oncologic Complaints and Symptoms: No Complaints or Symptoms Medical History: Negative for: Received Chemotherapy; Received  Radiation Psychiatric Complaints and Symptoms: No Complaints or Symptoms Medical History: Negative for: Anorexia/bulimia; Confinement Anxiety HBO Extended History Items Eyes: Cataracts Immunizations KEIRY, KOWAL ([id number]) Pneumococcal Vaccine: Received Pneumococcal Vaccination: Yes Family and Social History Cancer: No; Diabetes: Yes - Siblings, Paternal Grandparents, Maternal Grandparents; Heart Disease: Yes - Siblings, Paternal Grandparents; Hereditary Spherocytosis: No; Hypertension: Yes - Siblings; Kidney Disease: No; Lung Disease: Yes - Siblings, Maternal Grandparents; Seizures: No; Stroke: Yes - Siblings; Thyroid Problems: Yes - Siblings; Tuberculosis: No; Never smoker; Marital Status - Widowed; Alcohol Use: Never; Drug Use: No History; Caffeine Use: Moderate; Financial Concerns: No; Food, Clothing or Shelter Needs: No; Support System Lacking: No; Transportation Concerns: No; Advanced Directives: Yes (Not Provided); Patient does not want information on Advanced Directives; Living Will: Yes (Not Provided) Electronic Signature(s) Signed: 08/08/2016 5:27:57 PM By: Regan Lemming BSN, RN Signed: 08/09/2016 12:30:45 PM By: Linton Ham MD Entered By: Regan Lemming on 08/08/2016 13:51:50 Shannon [name] ([id number]) -------------------------------------------------------------------------------- SuperBill Details Patient Name: Shannon [name] Date of Service: 08/08/2016 Medical Record Patient Account Number: [id number] Number: Treating RN: Baruch Gouty, RN, BSN, Rita 30-Jul-1923 [phone] y.o. 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

## 2016-08-10 NOTE — Telephone Encounter (Signed)
Patients daughter stated that she would like to have a referral for palliative care and said that it was discussed at last office visit. Please advise

## 2016-08-11 ENCOUNTER — Other Ambulatory Visit: Payer: Self-pay

## 2016-08-11 ENCOUNTER — Ambulatory Visit (INDEPENDENT_AMBULATORY_CARE_PROVIDER_SITE_OTHER): Payer: Medicare Other | Admitting: Pharmacist

## 2016-08-11 ENCOUNTER — Ambulatory Visit: Payer: Medicare Other | Admitting: Neurology

## 2016-08-11 ENCOUNTER — Encounter (HOSPITAL_COMMUNITY): Payer: Medicare Other

## 2016-08-11 ENCOUNTER — Encounter: Payer: Medicare Other | Admitting: Vascular Surgery

## 2016-08-11 DIAGNOSIS — Z5181 Encounter for therapeutic drug level monitoring: Secondary | ICD-10-CM | POA: Diagnosis not present

## 2016-08-11 DIAGNOSIS — I4892 Unspecified atrial flutter: Secondary | ICD-10-CM

## 2016-08-11 DIAGNOSIS — I70262 Atherosclerosis of native arteries of extremities with gangrene, left leg: Secondary | ICD-10-CM

## 2016-08-11 LAB — POCT INR: INR: 2.4

## 2016-08-11 NOTE — Patient Outreach (Signed)
[facility] [address]) Care Management  08/11/2016  Shannon [name] 1923/07/11 [id number]   Difficult Case Discussion Case Review Date: 08/10/16 Reason: 6 hospital admissions within past 6 months PCP: Dr. Eulas Post Cardiologist: Dr. 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 Patient has had six hospital admissions within the past six months.   02/07/2016: sepsis  03/02/16: CHF   05/08/16: HCAP   05/22/16: respiratory distress   06/08/16: symptomatic bradycardia  07/28/16: ischemic toe  Disposition: Patient discharged home with dtr on 08/01/16/. [facility] RN,PT and SW in place per inpatient CM note.  Multiple conversations with dtr-HCPOA regarding goals of care by multiple medical staff members. Dtr continues to adamantly decline any and all services except HH , inlcuding [name] services. She was sent home on oral antibiotics and foot in left boot. MD notes indicate that patient has a poor prognosis. DNR is in place.   RN CM Follow Up: Per difficult case discussion RN CM intervention was to contact PCP office to discuss palliative/hospice care given her medical conditions and poor prognosis. However, upon chart review noted that patient's dtr/HCPOA contacted MD office yesterday requesting palliative care. No outreach to MD office needed at this time.   Enzo Montgomery, RN,BSN,CCM [place] [facility] Telephonic Care Management Coordinator Direct Phone: [phone] Toll Free: [phone] Fax: [phone]

## 2016-08-12 ENCOUNTER — Emergency Department (HOSPITAL_COMMUNITY): Payer: Medicare Other

## 2016-08-12 ENCOUNTER — Inpatient Hospital Stay (HOSPITAL_COMMUNITY)
Admission: EM | Admit: 2016-08-12 | Discharge: 2016-08-18 | DRG: 190 | Disposition: A | Discharge status: Home / Self Care | Payer: Medicare Other | Attending: Internal Medicine | Admitting: Internal Medicine

## 2016-08-12 ENCOUNTER — Encounter (HOSPITAL_COMMUNITY): Payer: Self-pay | Admitting: Emergency Medicine

## 2016-08-12 DIAGNOSIS — Z79899 Other long term (current) drug therapy: Secondary | ICD-10-CM | POA: Diagnosis not present

## 2016-08-12 DIAGNOSIS — E1121 Type 2 diabetes mellitus with diabetic nephropathy: Secondary | ICD-10-CM | POA: Diagnosis present

## 2016-08-12 DIAGNOSIS — E119 Type 2 diabetes mellitus without complications: Secondary | ICD-10-CM

## 2016-08-12 DIAGNOSIS — E039 Hypothyroidism, unspecified: Secondary | ICD-10-CM | POA: Diagnosis present

## 2016-08-12 DIAGNOSIS — Z931 Gastrostomy status: Secondary | ICD-10-CM | POA: Diagnosis not present

## 2016-08-12 DIAGNOSIS — K219 Gastro-esophageal reflux disease without esophagitis: Secondary | ICD-10-CM | POA: Diagnosis present

## 2016-08-12 DIAGNOSIS — I10 Essential (primary) hypertension: Secondary | ICD-10-CM | POA: Diagnosis present

## 2016-08-12 DIAGNOSIS — I5032 Chronic diastolic (congestive) heart failure: Secondary | ICD-10-CM | POA: Diagnosis present

## 2016-08-12 DIAGNOSIS — E785 Hyperlipidemia, unspecified: Secondary | ICD-10-CM | POA: Diagnosis present

## 2016-08-12 DIAGNOSIS — E43 Unspecified severe protein-calorie malnutrition: Secondary | ICD-10-CM | POA: Diagnosis present

## 2016-08-12 DIAGNOSIS — J690 Pneumonitis due to inhalation of food and vomit: Secondary | ICD-10-CM | POA: Diagnosis not present

## 2016-08-12 DIAGNOSIS — I130 Hypertensive heart and chronic kidney disease with heart failure and stage 1 through stage 4 chronic kidney disease, or unspecified chronic kidney disease: Secondary | ICD-10-CM | POA: Diagnosis present

## 2016-08-12 DIAGNOSIS — R0602 Shortness of breath: Secondary | ICD-10-CM | POA: Diagnosis present

## 2016-08-12 DIAGNOSIS — J441 Chronic obstructive pulmonary disease with (acute) exacerbation: Principal | ICD-10-CM | POA: Insufficient documentation

## 2016-08-12 DIAGNOSIS — F32A Depression, unspecified: Secondary | ICD-10-CM | POA: Diagnosis present

## 2016-08-12 DIAGNOSIS — Z7901 Long term (current) use of anticoagulants: Secondary | ICD-10-CM | POA: Diagnosis not present

## 2016-08-12 DIAGNOSIS — N183 Chronic kidney disease, stage 3 unspecified: Secondary | ICD-10-CM | POA: Diagnosis present

## 2016-08-12 DIAGNOSIS — J449 Chronic obstructive pulmonary disease, unspecified: Secondary | ICD-10-CM | POA: Diagnosis not present

## 2016-08-12 DIAGNOSIS — E1152 Type 2 diabetes mellitus with diabetic peripheral angiopathy with gangrene: Secondary | ICD-10-CM | POA: Diagnosis present

## 2016-08-12 DIAGNOSIS — Z86718 Personal history of other venous thrombosis and embolism: Secondary | ICD-10-CM

## 2016-08-12 DIAGNOSIS — N289 Disorder of kidney and ureter, unspecified: Secondary | ICD-10-CM

## 2016-08-12 DIAGNOSIS — Z794 Long term (current) use of insulin: Secondary | ICD-10-CM | POA: Diagnosis not present

## 2016-08-12 DIAGNOSIS — D649 Anemia, unspecified: Secondary | ICD-10-CM | POA: Diagnosis not present

## 2016-08-12 DIAGNOSIS — Z6824 Body mass index (BMI) 24.0-24.9, adult: Secondary | ICD-10-CM | POA: Diagnosis not present

## 2016-08-12 DIAGNOSIS — E1169 Type 2 diabetes mellitus with other specified complication: Secondary | ICD-10-CM | POA: Diagnosis present

## 2016-08-12 DIAGNOSIS — E876 Hypokalemia: Secondary | ICD-10-CM | POA: Diagnosis not present

## 2016-08-12 DIAGNOSIS — Z7189 Other specified counseling: Secondary | ICD-10-CM | POA: Diagnosis not present

## 2016-08-12 DIAGNOSIS — I2510 Atherosclerotic heart disease of native coronary artery without angina pectoris: Secondary | ICD-10-CM | POA: Diagnosis present

## 2016-08-12 DIAGNOSIS — I4819 Other persistent atrial fibrillation: Secondary | ICD-10-CM

## 2016-08-12 DIAGNOSIS — E1122 Type 2 diabetes mellitus with diabetic chronic kidney disease: Secondary | ICD-10-CM

## 2016-08-12 DIAGNOSIS — E1165 Type 2 diabetes mellitus with hyperglycemia: Secondary | ICD-10-CM | POA: Diagnosis present

## 2016-08-12 DIAGNOSIS — Z792 Long term (current) use of antibiotics: Secondary | ICD-10-CM | POA: Diagnosis not present

## 2016-08-12 DIAGNOSIS — Z66 Do not resuscitate: Secondary | ICD-10-CM | POA: Diagnosis present

## 2016-08-12 DIAGNOSIS — F329 Major depressive disorder, single episode, unspecified: Secondary | ICD-10-CM | POA: Diagnosis not present

## 2016-08-12 DIAGNOSIS — R069 Unspecified abnormalities of breathing: Secondary | ICD-10-CM | POA: Diagnosis not present

## 2016-08-12 DIAGNOSIS — R05 Cough: Secondary | ICD-10-CM | POA: Diagnosis not present

## 2016-08-12 DIAGNOSIS — I5033 Acute on chronic diastolic (congestive) heart failure: Secondary | ICD-10-CM | POA: Diagnosis not present

## 2016-08-12 DIAGNOSIS — Z515 Encounter for palliative care: Secondary | ICD-10-CM | POA: Diagnosis not present

## 2016-08-12 DIAGNOSIS — Z8673 Personal history of transient ischemic attack (TIA), and cerebral infarction without residual deficits: Secondary | ICD-10-CM

## 2016-08-12 DIAGNOSIS — I96 Gangrene, not elsewhere classified: Secondary | ICD-10-CM | POA: Diagnosis not present

## 2016-08-12 LAB — BASIC METABOLIC PANEL
Anion gap: 9 (ref 5–15)
BUN: 62 mg/dL — ABNORMAL HIGH (ref 6–20)
CO2: 26 mmol/L (ref 22–32)
Calcium: 9.3 mg/dL (ref 8.9–10.3)
Chloride: 98 mmol/L — ABNORMAL LOW (ref 101–111)
Creatinine, Ser: 1.03 mg/dL — ABNORMAL HIGH (ref 0.44–1.00)
GFR calc Af Amer: 53 mL/min — ABNORMAL LOW (ref >60)
GFR calc non Af Amer: 46 mL/min — ABNORMAL LOW (ref >60)
Glucose, Bld: 186 mg/dL — ABNORMAL HIGH (ref 65–99)
Potassium: 4.5 mmol/L (ref 3.5–5.1)
Sodium: 133 mmol/L — ABNORMAL LOW (ref 135–145)

## 2016-08-12 LAB — PROTIME-INR
INR: 1.98
Prothrombin Time: 22.8 seconds — ABNORMAL HIGH (ref 11.4–15.2)

## 2016-08-12 LAB — CBC
HCT: 25.7 % — ABNORMAL LOW (ref 36.0–46.0)
Hemoglobin: 8.5 g/dL — ABNORMAL LOW (ref 12.0–15.0)
MCH: 29.1 pg (ref 26.0–34.0)
MCHC: 33.1 g/dL (ref 30.0–36.0)
MCV: 88 fL (ref 78.0–100.0)
Platelets: 369 10*3/uL (ref 150–400)
RBC: 2.92 MIL/uL — ABNORMAL LOW (ref 3.87–5.11)
RDW: 17.5 % — ABNORMAL HIGH (ref 11.5–15.5)
WBC: 8.5 10*3/uL (ref 4.0–10.5)

## 2016-08-12 LAB — BRAIN NATRIURETIC PEPTIDE: B Natriuretic Peptide: 92.2 pg/mL (ref 0.0–100.0)

## 2016-08-12 IMAGING — CT CT ABD-PELV W/O CM
1 of 2 series · 14 of 32 positions shown, 18 images · non-contrast
Comparison: 03/07/2014

CLINICAL DATA: Constant vomiting today, constipated since [REDACTED],
RIGHT lower quadrant rectal pain, history diabetes, hypertension,
prior gastric bezoar

EXAM:
CT ABDOMEN AND PELVIS WITHOUT CONTRAST
TECHNIQUE: Multidetector CT imaging of the abdomen and pelvis was performed
following the standard protocol without IV contrast. Sagittal and
coronal MPR images reconstructed from axial data set.

[Series 2: abd/pel w/o · axial · non-contrast · 0.66mm/px · z∈[+966,+1376]mm · 14 of 90 slices shown, 18 images]
[im 4/90  soft-tissue]
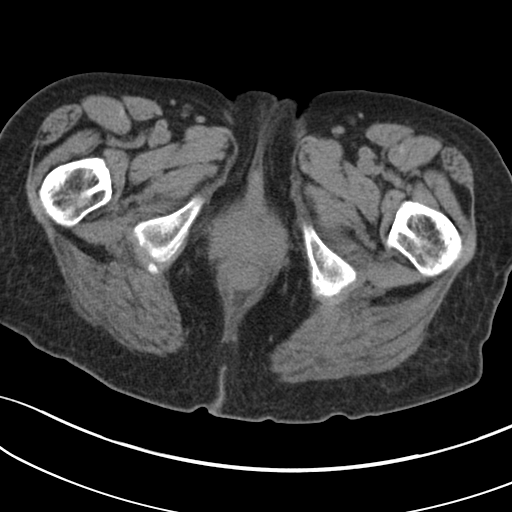
[im 4/90  bone]
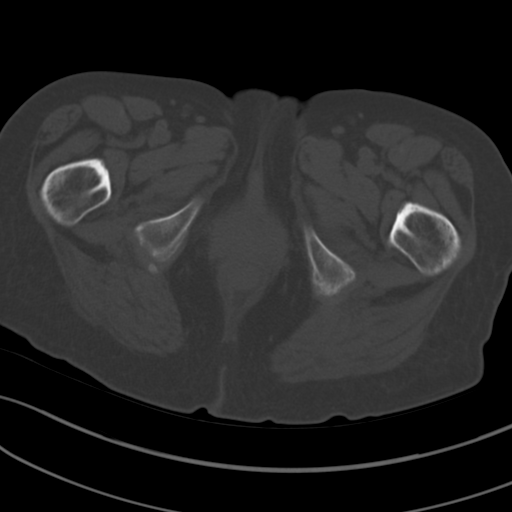
[im 12/90  soft-tissue]
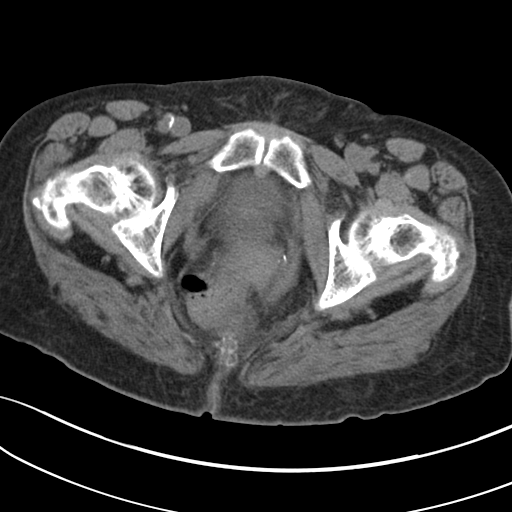
[im 20/90  soft-tissue]
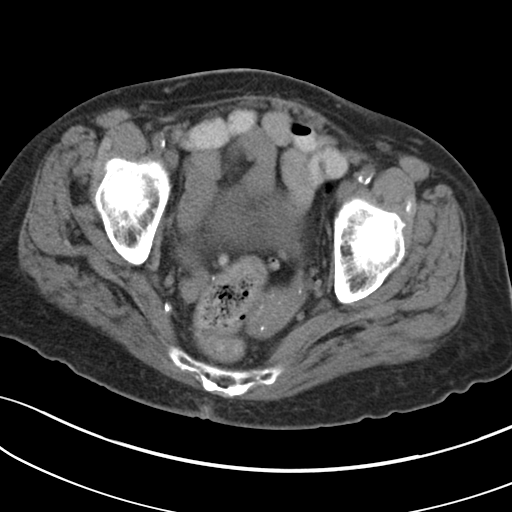
[im 28/90  soft-tissue]
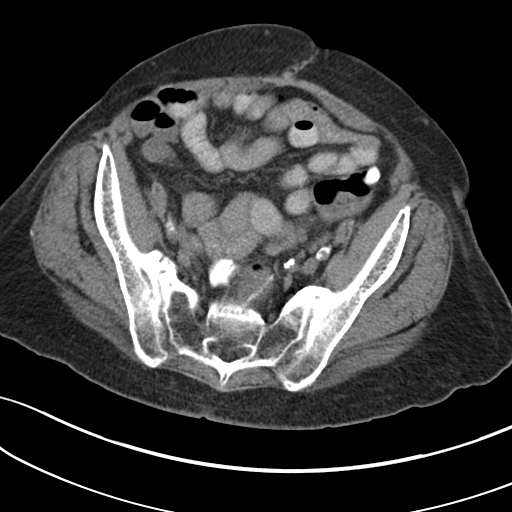
[im 35/90  soft-tissue]
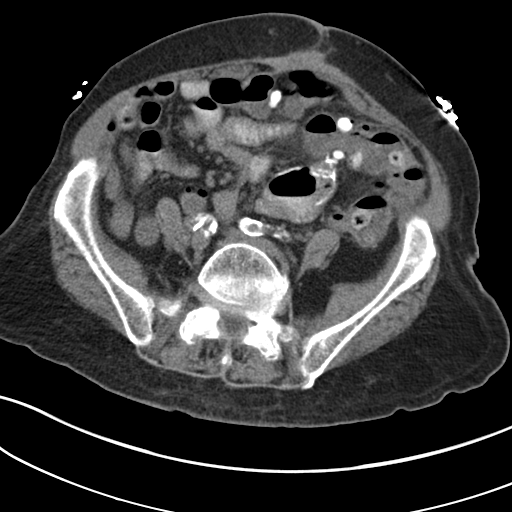
[im 43/90  soft-tissue]
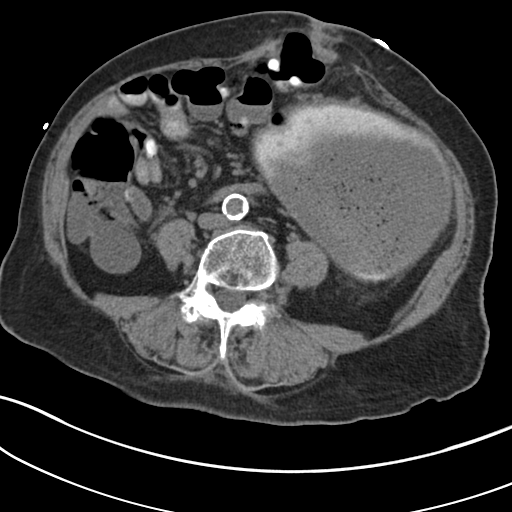
[im 47/90  soft-tissue]
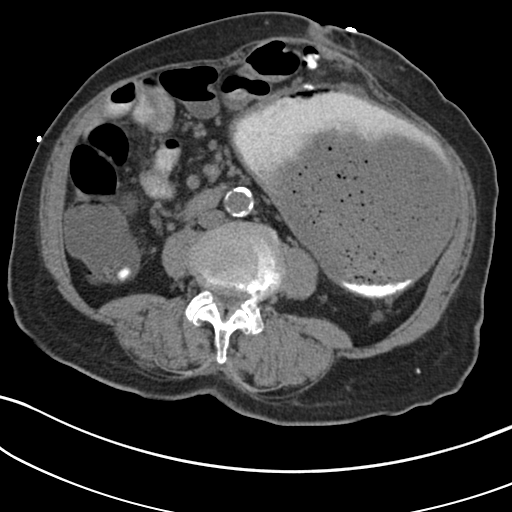
[im 55/90  soft-tissue]
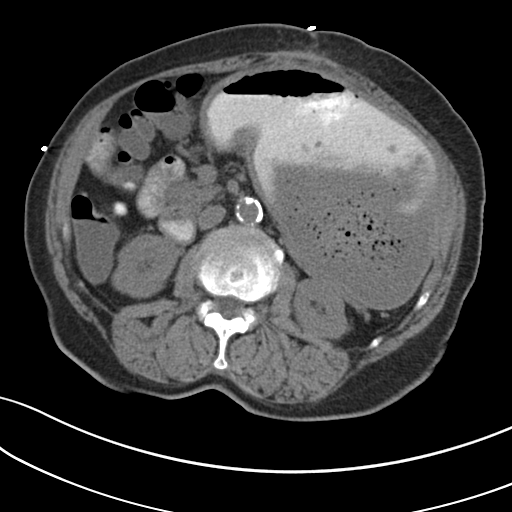
[im 62/90  soft-tissue]
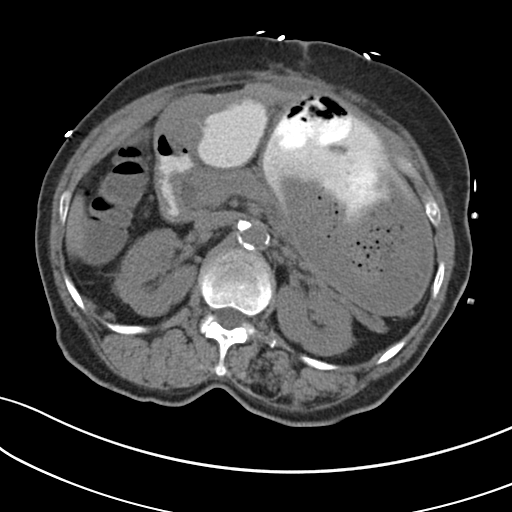
[im 62/90  bone]
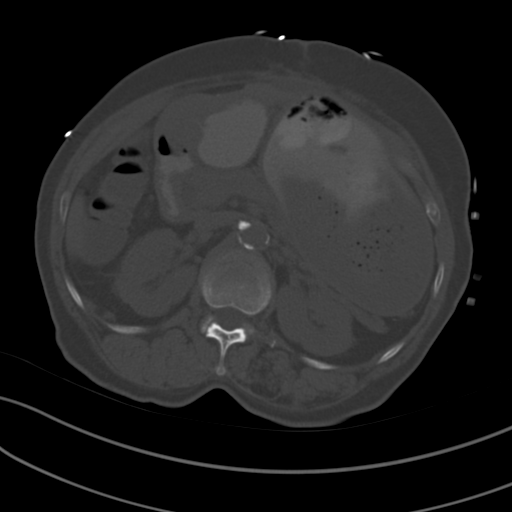
[im 70/90  soft-tissue]
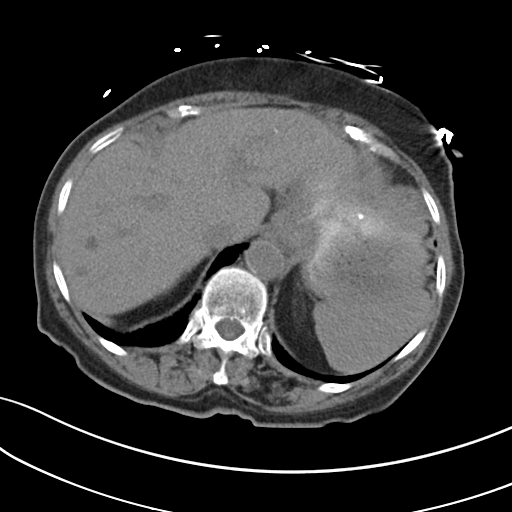
[im 74/90  lung]
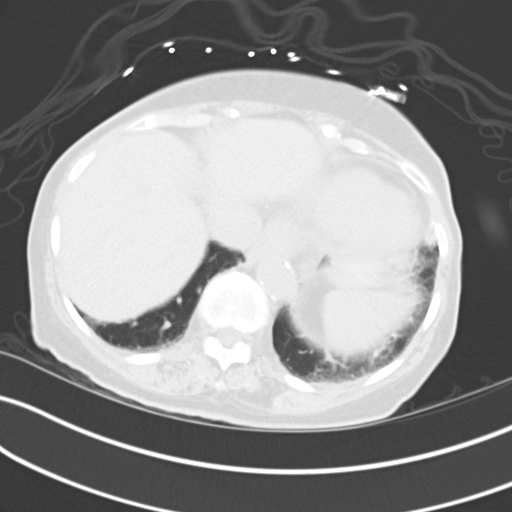
[im 78/90  soft-tissue]
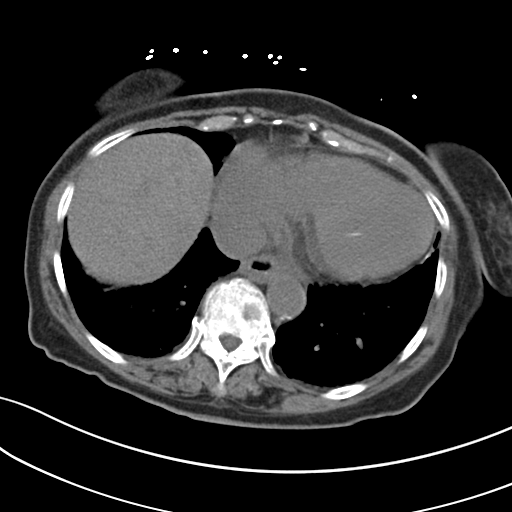
[im 78/90  lung]
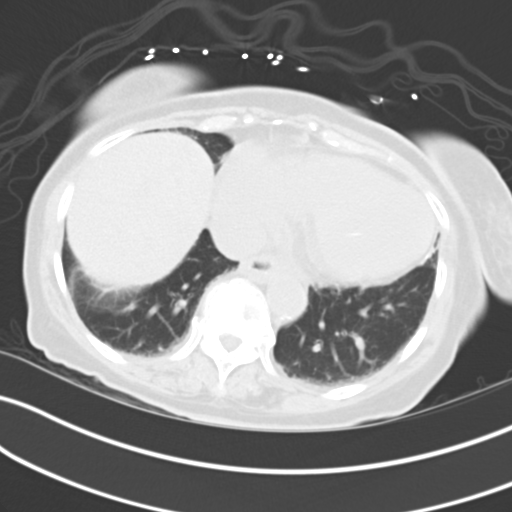
[im 82/90  lung]
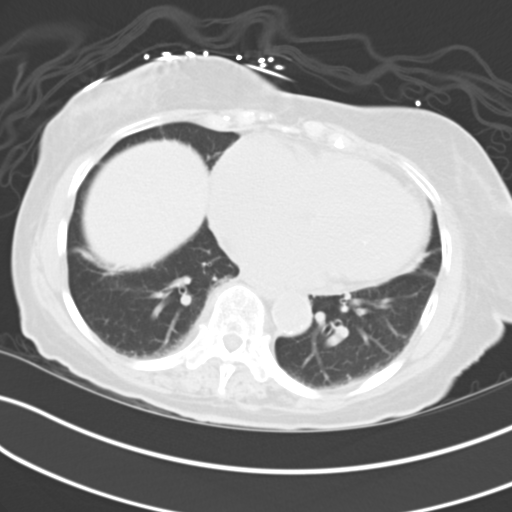
[im 86/90  soft-tissue]
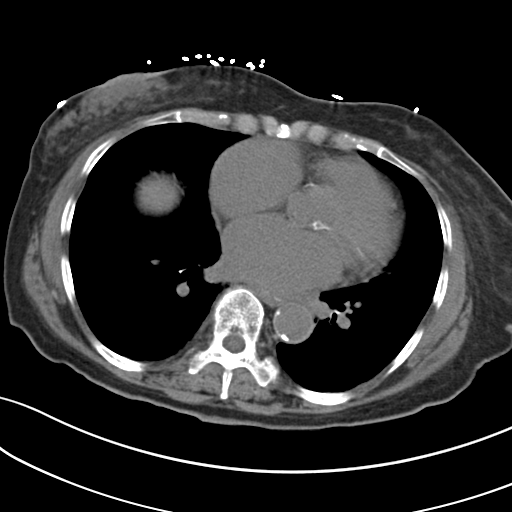
[im 86/90  lung]
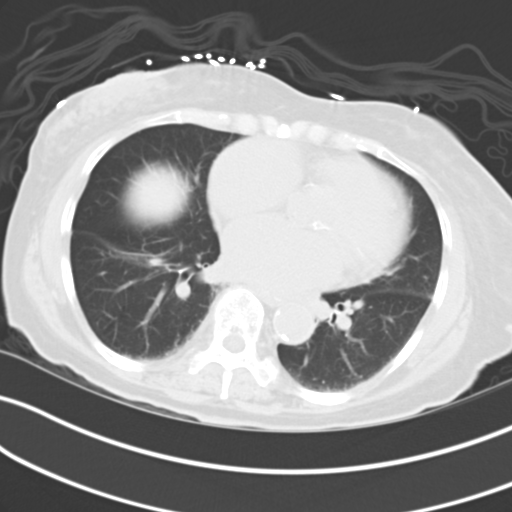

[14 of 32 positions shown; findings below may reference images not displayed]

FINDINGS: Minimal bibasilar atelectasis.

Low-attenuation foci within liver, question cysts, unchanged.

Metallic foreign body at LEFT ventricle again identified, uncertain
etiology.

Gallbladder surgically absent.

Probable tiny LEFT renal cyst 9 mm diameter.

Within limits of a nonenhanced exam, remainder of liver, spleen,
pancreas, kidneys and adrenal glands normal appearance.

Persistent marked thickening of the pyloric channel and pre pyloric
antrum gastric distention question partial gastric outlet
obstruction.

Small amount of ingested contrast is present within proximal small
bowel loops.

Large filling defect within the stomach, 12.2 x 10.0 x 15.2 cm,
containing bubbly gas, consistent with recurrent bezoar.

Scattered colonic diverticula.

No evidence of bowel obstruction or dilatation.

Extensive atherosclerotic calcifications aorta and minimally in
coronary arteries.

High attenuation nodule 13 mm diameter again identified adjacent to
the thickened gastroesophageal junction, unchanged but since
03/07/2014 but new since 01/17/2014, question contrast within
epiphrenic diverticulum or small focal contained perforation,
unlikely calcified lymph node since this is new in past 2 months.

Unremarkable bladder, ureters, uterus and adnexae.

Appendix not visualized.

No definite rectal wall thickening.

No additional mass, adenopathy, free fluid, free air or abscess.

Bones demineralized with scattered degenerative changes.

Surgical scar at ventral abdomen with a small incisional hernia
containing a portion of transverse colon.
IMPRESSION: Thickened pylorus and pre-pyloric antrum likely causing partial
outlet obstruction.

Large filling defect within stomach consistent with recurrent bezoar
12.2 x 10.0 x 15.2 cm.

Colonic diverticulosis.

Suspected hepatic cysts unchanged.

13 mm high attenuation nodule adjacent to thickened gastroesophageal
junction, question contained perforation versus epiphrenic
diverticulum containing contrast, new since 01/17/2014 but unchanged
since 03/07/2014.

Small incisional hernia containing a portion of the transverse colon
wall without obstruction.

## 2016-08-12 MED ORDER — AMLODIPINE BESYLATE 10 MG PO TABS
10.0000 mg | ORAL_TABLET | Freq: Every day | ORAL | Status: DC
  Administered 2016-08-13 – 2016-08-18 (×6): 10 mg via ORAL
  Filled 2016-08-12 (×6): qty 1

## 2016-08-12 MED ORDER — FLUTICASONE PROPIONATE 50 MCG/ACT NA SUSP
2.0000 | Freq: Every day | NASAL | Status: DC
  Administered 2016-08-13 – 2016-08-18 (×6): 2 via NASAL
  Filled 2016-08-12: qty 16

## 2016-08-12 MED ORDER — IPRATROPIUM BROMIDE 0.02 % IN SOLN
0.5000 mg | Freq: Four times a day (QID) | RESPIRATORY_TRACT | Status: DC
  Administered 2016-08-12: 0.5 mg via RESPIRATORY_TRACT
  Filled 2016-08-12: qty 2.5

## 2016-08-12 MED ORDER — WARFARIN - PHARMACIST DOSING INPATIENT: Freq: Every day | Status: DC

## 2016-08-12 MED ORDER — IPRATROPIUM BROMIDE 0.02 % IN SOLN
0.5000 mg | Freq: Once | RESPIRATORY_TRACT | Status: AC
  Administered 2016-08-12: 0.5 mg via RESPIRATORY_TRACT
  Filled 2016-08-12: qty 2.5

## 2016-08-12 MED ORDER — ACETAMINOPHEN 500 MG PO TABS
1000.0000 mg | ORAL_TABLET | Freq: Four times a day (QID) | ORAL | Status: DC | PRN
  Administered 2016-08-13 – 2016-08-14 (×3): 1000 mg via ORAL
  Filled 2016-08-12 (×3): qty 2

## 2016-08-12 MED ORDER — ONDANSETRON HCL 4 MG/2ML IJ SOLN: 4.0000 mg | Freq: Four times a day (QID) | INTRAMUSCULAR | Status: DC | PRN

## 2016-08-12 MED ORDER — ATORVASTATIN CALCIUM 10 MG PO TABS
10.0000 mg | ORAL_TABLET | Freq: Every day | ORAL | Status: DC
  Administered 2016-08-13 – 2016-08-18 (×6): 10 mg via ORAL
  Filled 2016-08-12 (×6): qty 1

## 2016-08-12 MED ORDER — ALBUTEROL SULFATE (2.5 MG/3ML) 0.083% IN NEBU: 2.5000 mg | INHALATION_SOLUTION | RESPIRATORY_TRACT | Status: DC | PRN

## 2016-08-12 MED ORDER — FUROSEMIDE 20 MG PO TABS
20.0000 mg | ORAL_TABLET | Freq: Every day | ORAL | Status: DC
Start: 2016-08-13 — End: 2016-08-18
  Administered 2016-08-13 – 2016-08-18 (×6): 20 mg via ORAL
  Filled 2016-08-12 (×6): qty 1

## 2016-08-12 MED ORDER — METHYLPREDNISOLONE SODIUM SUCC 40 MG IJ SOLR
40.0000 mg | Freq: Four times a day (QID) | INTRAMUSCULAR | Status: DC
  Administered 2016-08-12 – 2016-08-15 (×11): 40 mg via INTRAVENOUS
  Filled 2016-08-12 (×11): qty 1

## 2016-08-12 MED ORDER — WARFARIN SODIUM 4 MG PO TABS
4.0000 mg | ORAL_TABLET | Freq: Once | ORAL | Status: AC
  Administered 2016-08-13: 4 mg via ORAL
  Filled 2016-08-12: qty 1

## 2016-08-12 MED ORDER — ALBUTEROL SULFATE (2.5 MG/3ML) 0.083% IN NEBU
5.0000 mg | INHALATION_SOLUTION | Freq: Once | RESPIRATORY_TRACT | Status: AC
  Administered 2016-08-12: 5 mg via RESPIRATORY_TRACT
  Filled 2016-08-12: qty 6

## 2016-08-12 MED ORDER — ONDANSETRON HCL 4 MG PO TABS: 4.0000 mg | ORAL_TABLET | Freq: Four times a day (QID) | ORAL | Status: DC | PRN

## 2016-08-12 MED ORDER — LEVOTHYROXINE SODIUM 25 MCG PO TABS
137.0000 ug | ORAL_TABLET | Freq: Every day | ORAL | Status: DC
  Administered 2016-08-13 – 2016-08-18 (×6): 137 ug via ORAL
  Filled 2016-08-12 (×6): qty 1

## 2016-08-12 MED ORDER — SODIUM CHLORIDE 0.9% FLUSH
3.0000 mL | Freq: Two times a day (BID) | INTRAVENOUS | Status: DC
  Administered 2016-08-12 – 2016-08-18 (×12): 3 mL via INTRAVENOUS

## 2016-08-12 MED ORDER — LATANOPROST 0.005 % OP SOLN
1.0000 [drp] | Freq: Every day | OPHTHALMIC | Status: DC
  Administered 2016-08-13 – 2016-08-17 (×5): 1 [drp] via OPHTHALMIC
  Filled 2016-08-12: qty 2.5

## 2016-08-12 MED ORDER — INSULIN ASPART 100 UNIT/ML ~~LOC~~ SOLN
0.0000 [IU] | Freq: Three times a day (TID) | SUBCUTANEOUS | Status: DC
  Administered 2016-08-13: 3 [IU] via SUBCUTANEOUS
  Administered 2016-08-13: 2 [IU] via SUBCUTANEOUS
  Administered 2016-08-13: 3 [IU] via SUBCUTANEOUS
  Administered 2016-08-14 (×2): 5 [IU] via SUBCUTANEOUS
  Administered 2016-08-14: 3 [IU] via SUBCUTANEOUS

## 2016-08-12 MED ORDER — HYDRALAZINE HCL 20 MG/ML IJ SOLN: 5.0000 mg | INTRAMUSCULAR | Status: DC | PRN

## 2016-08-12 MED ORDER — ISOSORBIDE MONONITRATE ER 30 MG PO TB24
30.0000 mg | ORAL_TABLET | Freq: Every day | ORAL | Status: DC
  Administered 2016-08-13 – 2016-08-18 (×6): 30 mg via ORAL
  Filled 2016-08-12 (×6): qty 1

## 2016-08-12 MED ORDER — LOSARTAN POTASSIUM 50 MG PO TABS
50.0000 mg | ORAL_TABLET | Freq: Every day | ORAL | Status: DC
  Administered 2016-08-13 – 2016-08-18 (×6): 50 mg via ORAL
  Filled 2016-08-12 (×6): qty 1

## 2016-08-12 MED ORDER — METHYLPREDNISOLONE SODIUM SUCC 125 MG IJ SOLR
125.0000 mg | Freq: Once | INTRAMUSCULAR | Status: AC
  Administered 2016-08-12: 125 mg via INTRAVENOUS
  Filled 2016-08-12: qty 2

## 2016-08-12 MED ORDER — ENSURE ENLIVE PO LIQD
237.0000 mL | Freq: Two times a day (BID) | ORAL | Status: DC
  Administered 2016-08-13: 237 mL via ORAL

## 2016-08-12 MED ORDER — ALBUTEROL SULFATE (2.5 MG/3ML) 0.083% IN NEBU
2.5000 mg | INHALATION_SOLUTION | Freq: Four times a day (QID) | RESPIRATORY_TRACT | Status: DC
  Administered 2016-08-12: 2.5 mg via RESPIRATORY_TRACT
  Filled 2016-08-12: qty 3

## 2016-08-12 MED ORDER — IPRATROPIUM-ALBUTEROL 0.5-2.5 (3) MG/3ML IN SOLN
3.0000 mL | Freq: Three times a day (TID) | RESPIRATORY_TRACT | Status: DC
  Administered 2016-08-13 – 2016-08-18 (×17): 3 mL via RESPIRATORY_TRACT
  Filled 2016-08-12 (×17): qty 3

## 2016-08-12 MED ORDER — ENOXAPARIN SODIUM 40 MG/0.4ML ~~LOC~~ SOLN
40.0000 mg | SUBCUTANEOUS | Status: DC
  Filled 2016-08-12: qty 0.4

## 2016-08-12 MED ORDER — SERTRALINE HCL 50 MG PO TABS
50.0000 mg | ORAL_TABLET | Freq: Every day | ORAL | Status: DC
  Administered 2016-08-13 – 2016-08-18 (×6): 50 mg via ORAL
  Filled 2016-08-12 (×6): qty 1

## 2016-08-12 NOTE — ED Notes (Signed)
Bed: QF90 Expected date:  Expected time:  Means of arrival:  Comments: 83 yo SOB- nebs given

## 2016-08-12 NOTE — Progress Notes (Signed)
ANTICOAGULATION CONSULT NOTE - Initial Consult  Pharmacy Consult for Warfarin Indication: DVT  Allergies  Allergen Reactions  . Penicillins Itching, Rash and Other (See Comments)    Tolerated unasyn, zosyn & cephalosporins in the past.  Has patient had a PCN reaction causing immediate rash, facial/tongue/throat swelling, SOB or lightheadedness with hypotension: Yes Has patient had a PCN reaction causing severe rash involving mucus membranes or skin necrosis: Unk Has patient had a PCN reaction that required hospitalization: Unk Has patient had a PCN reaction occurring within the last 10 years: Unk If all of the above answers are "NO", then may proceed with Cephalosporins    Patient Measurements: Height: 5\' 6"  (167.6 cm) Weight: 150 lb 9.2 oz (68.3 kg) IBW/kg (Calculated) : 59.3 Heparin Dosing Weight:   Vital Signs: Temp: 98 F (36.7 C) (05/12 2116) Temp Source: Oral (05/12 2116) BP: 139/85 (05/12 2116) Pulse Rate: 102 (05/12 2116)  Labs:  Recent Labs  08/11/16 1433 08/12/16 1400 08/12/16 1429  HGB  --  8.5*  --   HCT  --  25.7*  --   PLT  --  369  --   LABPROT  --   --  22.8*  INR 2.4  --  1.98  CREATININE  --  1.03*  --     Estimated Creatinine Clearance: 33.3 mL/min (A) (by C-G formula based on SCr of 1.03 mg/dL (H)).   Medical History: Past Medical History:  Diagnosis Date  . Anemia    previous blood transfusions  . Arthritis    "all over"  . Asthma   . Bradycardia    requiring previous d/c of BB and reduction of amiodarone  . CAD (coronary artery disease)    nonobstructive per notes  . Chronic diastolic CHF (congestive heart failure) ([place])   . CKD (chronic kidney disease), stage III   . Complication of blood transfusion    "got the wrong blood type at [facility] in ~ 2015; no adverse reaction that we are aware of"/daughter, [name] (01/27/2016)  . COPD (chronic obstructive pulmonary disease) ([place])   . Depression    "light case"  . DVT (deep venous  thrombosis) ([place]) 01/2016   a. LLE DVT 01/2016 - switched from Eliquis to Coumadin.  . Gastric stenosis    a. s/p stomach tube  . GERD (gastroesophageal reflux disease)   . History of blood transfusion    "several" (01/27/2016)  . History of stomach ulcers   . Hyperlipidemia   . Hypertension   . Hypothyroidism   . Paraesophageal hernia   . Perforated gastric ulcer ([place])   . Seasonal allergies   . SIADH (syndrome of inappropriate ADH production) ([place])    [name] 01/10/2015  . Small bowel obstruction ([place])    "I don't know how many" (01/11/2015)  . Stroke ([place])    "light one"  . Type II diabetes mellitus ([place])    "related to prednisone use  for > 20 yr; once predinose stopped; no more DM RX" (01/27/2016)  . Ventral hernia with bowel obstruction     Medications:  Prescriptions Prior to Admission  Medication Sig Dispense Refill Last Dose  . acetaminophen (TYLENOL) 500 MG tablet Take 1,000 mg by mouth every 6 (six) hours as needed (pain).   Past Month at Unknown time  . AMINO ACIDS-PROTEIN HYDROLYS PO Take 30 mLs by mouth 2 (two) times daily.   08/12/2016 at Unknown time  . amLODipine (NORVASC) 10 MG tablet TAKE 1 TABLET BY MOUTH DAILY 30  tablet 1 08/12/2016 at Unknown time  . atorvastatin (LIPITOR) 10 MG tablet TAKE ONE TABLET BY MOUTH ONCE DAILY 90 tablet 1 08/12/2016 at Unknown time  . b complex vitamins tablet Take 1 tablet by mouth daily.    08/12/2016 at Unknown time  . calcium-vitamin D (OSCAL WITH D) 500-200 MG-UNIT per tablet Take 1 tablet by mouth daily with breakfast.    08/12/2016 at Unknown time  . chlorhexidine (PERIDEX) 0.12 % solution Use as directed 15 mLs in the mouth or throat 2 (two) times daily.   08/11/2016 at Unknown time  . clindamycin (CLEOCIN) 300 MG capsule Take 1 capsule (300 mg total) by mouth every 8 (eight) hours. (Patient taking differently: Take 300 mg by mouth every 8 (eight) hours. 0600,1400,2200) 90 capsule 0 08/12/2016 at Unknown time  . fluticasone  (FLONASE) 50 MCG/ACT nasal spray Place 2 sprays into both nostrils daily. 16 g 3 08/12/2016 at Unknown time  . furosemide (LASIX) 20 MG tablet Take 1 tablet (20 mg total) by mouth daily. 30 tablet 3 08/12/2016 at Unknown time  . gabapentin (NEURONTIN) 300 MG capsule 1 tablet by mouth three times daily, may have additional dose every 8 hours as needed pain (Patient taking differently: Take 300-600 mg by mouth 3 (three) times daily. Take 600mg  by mouth in the morning, 300mg  by mouth in the afternoon, and 372my by mouth at bedtime) 90 capsule 0 08/12/2016 at Unknown time  . Insulin Glargine (LANTUS SOLOSTAR) 100 UNIT/ML Solostar Pen Inject 5 Units into the skin daily at 10 pm. 5 pen 6 08/11/2016 at Unknown time  . ipratropium-albuterol (DUONEB) 0.5-2.5 (3) MG/3ML SOLN Take 3 mLs by nebulization every 6 (six) hours as needed (sob). Use 3 times daily x 4 days then every 6 hours as needed. 360 mL 4 08/12/2016 at Unknown time  . isosorbide mononitrate (IMDUR) 30 MG 24 hr tablet Take 30 mg by mouth daily.   08/11/2016 at Unknown time  . latanoprost (XALATAN) 0.005 % ophthalmic solution Place 1 drop into both eyes at bedtime. 2.5 mL 12 08/11/2016 at Unknown time  . levalbuterol (XOPENEX HFA) 45 MCG/ACT inhaler Inhale 2 puffs into the lungs every 6 (six) hours as needed for wheezing or shortness of breath. 1 Inhaler 12 08/12/2016 at Unknown time  . levothyroxine (SYNTHROID, LEVOTHROID) 137 MCG tablet TAKE 1 TABLET (137 MCG TOTAL) BY MOUTH DAILY BEFORE BREAKFAST. 30 tablet 2 08/12/2016 at Unknown time  . losartan (COZAAR) 50 MG tablet Take 50 mg by mouth daily.  2 08/12/2016 at Unknown time  . Maltodextrin-Xanthan Gum (RESOURCE THICKENUP CLEAR) POWD Take 120 g by mouth as needed (use to thicken liquids.). 20 Can 0 Taking  . Multiple Vitamin (MULTIVITAMIN WITH MINERALS) TABS tablet Take 1 tablet by mouth daily.   08/12/2016 at Unknown time  . Nutritional Supplements (FEEDING SUPPLEMENT, OSMOLITE 1.5 CAL,) LIQD Place 1,000 mLs  into feeding tube daily. 1000 mL 3 08/12/2016 at Unknown time  . Polyethyl Glycol-Propyl Glycol 0.4-0.3 % SOLN Place 1 drop into both eyes 4 (four) times daily.    08/12/2016 at Unknown time  . polyethylene glycol (MIRALAX / GLYCOLAX) packet Take 17 g by mouth daily as needed for mild constipation.   unknown  . RESTASIS 0.05 % ophthalmic emulsion USE 1 DROP INTO BOTH EYES TWICE DAILY 60 mL 3 08/12/2016 at Unknown time  . sertraline (ZOLOFT) 100 MG tablet TAKE ONE TABLET BY MOUTH ONCE DAILY FOR DEPRESSION 90 tablet 1 08/12/2016 at Unknown time  . warfarin (  COUMADIN) 2.5 MG tablet TAKE AS DIRECTED BY COUMADIN CLINIC. (Patient taking differently: Monday 2.5mg  and all other days 3.75mg ) 40 tablet 1 08/11/2016 at 1800  . Water For Irrigation, Sterile (FREE WATER) SOLN Place 230 mLs into feeding tube 3 (three) times daily.   Taking   Scheduled:  . [START ON 08/13/2016] amLODipine  10 mg Oral Daily  . [START ON 08/13/2016] atorvastatin  10 mg Oral Daily  . [START ON 08/13/2016] feeding supplement (ENSURE ENLIVE)  237 mL Oral BID BM  . [START ON 08/13/2016] fluticasone  2 spray Each Nare Daily  . [START ON 08/13/2016] furosemide  20 mg Oral Daily  . [START ON 08/13/2016] insulin aspart  0-9 Units Subcutaneous TID WC  . [START ON 08/13/2016] ipratropium-albuterol  3 mL Nebulization TID  . [START ON 08/13/2016] isosorbide mononitrate  30 mg Oral Daily  . [START ON 08/13/2016] latanoprost  1 drop Both Eyes QHS  . [START ON 08/13/2016] levothyroxine  137 mcg Oral QAC breakfast  . [START ON 08/13/2016] losartan  50 mg Oral Daily  . methylPREDNISolone (SOLU-MEDROL) injection  40 mg Intravenous Q6H  . [START ON 08/13/2016] sertraline  50 mg Oral Daily  . sodium chloride flush  3 mL Intravenous Q12H  . [START ON 08/13/2016] Warfarin - Pharmacist Dosing Inpatient   Does not apply q1800    Assessment: Patient with hx of DVT and on chronic warfarin prior to admission.  INR on admit < 2.  MD wants warfarin to start  5/13.  Goal of Therapy:  INR 2-3    Plan:  Daily INR Warfarin 4mg  po x1  [name] 08/12/2016,9:56 PM

## 2016-08-12 NOTE — ED Triage Notes (Signed)
Pt brought in by EMS from home with shortness of breath. Pt with productive cough, labored breathing on arrival. EMS gave patient 1 Albuterol and 1 Duoneb treatment. Pt arrived with wheezing and rhonchi. Pt c/o headache and L ear pain.

## 2016-08-12 NOTE — H&P (Addendum)
History and Physical    Shannon [name] GEX:[id number] DOB: July 18, 1923 DOA: 08/12/2016  Referring MD/NP/PA: Dr. Karma Ganja  PCP: Kirt Boys, DO   Patient coming from: home   Chief Complaint: dyspnea  HPI: Shannon [name] is a 83 y.o. female with known COPD, CAD, HTN, CKD stage III, dementia, presented to [facility] ED with main concern of several days duration of progressively worsening dyspnea, initially present with exertion but has progressed to dyspnea at rest for the past 24 hours, associated with wheezing, non productive cough, chest tightness that occurs with coughing spells, poor oral intake., Please note pt is unable to provide much of the history due to dementia and family at bedside able to provide some information.   ED Course: Pt initially with significant dyspnea but improved with nebulizer treatments and solumedrol. Currently VSS, TRH asked to admit for further evaluation.   Review of Systems:  Unable to obtain due to dementia   Past Medical History:  Diagnosis Date  . Anemia    previous blood transfusions  . Arthritis    "all over"  . Asthma   . Bradycardia    requiring previous d/c of BB and reduction of amiodarone  . CAD (coronary artery disease)    nonobstructive per notes  . Chronic diastolic CHF (congestive heart failure) (H[facility])   . CKD (chronic kidney disease), stage III   . Complication of blood transfusion    "got the wrong blood type at [facility] in ~ 2015; no adverse reaction that we are aware of"/daughter, [name] (01/27/2016)  . COPD (chronic obstructive pulmonary disease) (H[facility])   . Depression    "light case"  . DVT (deep venous thrombosis) (H[facility]) 01/2016   a. LLE DVT 01/2016 - switched from Eliquis to Coumadin.  . Gastric stenosis    a. s/p stomach tube  . GERD (gastroesophageal reflux disease)   . History of blood transfusion    "several" (01/27/2016)  . History of stomach ulcers   . Hyperlipidemia   . Hypertension   . Hypothyroidism   . Paraesophageal  hernia   . Perforated gastric ulcer (H[facility])   . Seasonal allergies   . SIADH (syndrome of inappropriate ADH production) ([facility])    [name] 01/10/2015  . Small bowel obstruction ([facility])    "I don't know how many" (01/11/2015)  . Stroke ([facility])    "light one"  . Type II diabetes mellitus ([facility])    "related to prednisone use  for > 20 yr; once predinose stopped; no more DM RX" (01/27/2016)  . Ventral hernia with bowel obstruction     Past Surgical History:  Procedure Laterality Date  . CATARACT EXTRACTION W/ INTRAOCULAR LENS  IMPLANT, BILATERAL    . CHOLECYSTECTOMY OPEN    . COLECTOMY    . ESOPHAGOGASTRODUODENOSCOPY N/A 01/19/2014   Procedure: ESOPHAGOGASTRODUODENOSCOPY (EGD);  Surgeon: Hilarie Fredrickson, MD;  Location: [facility];  Service: Endoscopy;  Laterality: N/A;  . ESOPHAGOGASTRODUODENOSCOPY N/A 01/20/2014   Procedure: ESOPHAGOGASTRODUODENOSCOPY (EGD);  Surgeon: Hilarie Fredrickson, MD;  Location: [facility];  Service: Endoscopy;  Laterality: N/A;  . ESOPHAGOGASTRODUODENOSCOPY N/A 03/19/2014   Procedure: ESOPHAGOGASTRODUODENOSCOPY (EGD);  Surgeon: Rachael Fee, MD;  Location: [facility];  Service: Endoscopy;  Laterality: N/A;  . ESOPHAGOGASTRODUODENOSCOPY N/A 07/08/2015   Procedure: ESOPHAGOGASTRODUODENOSCOPY (EGD);  Surgeon: Sherrilyn Rist, MD;  Location: [facility] ENDOSCOPY;  Service: Endoscopy;  Laterality: N/A;  . ESOPHAGOGASTRODUODENOSCOPY (EGD) WITH PROPOFOL N/A 09/15/2015   Procedure: ESOPHAGOGASTRODUODENOSCOPY (EGD) WITH PROPOFOL;  Surgeon: Ruffin Frederick, MD;  Location: [facility] ENDOSCOPY;  Service: Gastroenterology;  Laterality: N/A;  . GASTROJEJUNOSTOMY     hx/notes 01/10/2015  . GASTROJEJUNOSTOMY N/A 09/23/2015   Procedure: OPEN GASTROJEJUNOSTOMY TUBE PLACEMENT;  Surgeon: De Blanch Kinsinger, MD;  Location: [facility] ORS;  Service: General;  Laterality: N/A;  . GLAUCOMA SURGERY Bilateral   . HERNIA REPAIR  2015  . IR GENERIC HISTORICAL  01/07/2016   IR GJ TUBE CHANGE 01/07/2016 Malachy Moan, MD [facility]  . IR GENERIC HISTORICAL  01/27/2016   IR MECH REMOV OBSTRUC MAT ANY COLON TUBE W/FLUORO 01/27/2016 Richarda Overlie, MD [facility]  . IR GENERIC HISTORICAL  02/07/2016   IR PATIENT EVAL TECH 0-60 MINS Darrell K Allred, PA-C [facility]  . IR GENERIC HISTORICAL  02/08/2016   IR GJ TUBE CHANGE 02/08/2016 Berdine Dance, MD [facility]  . IR GENERIC HISTORICAL  01/06/2016   IR GJ TUBE CHANGE 01/06/2016 [facility]-RAD OUT REF  . IR GENERIC HISTORICAL  05/02/2016   IR CM INJ ANY COLONIC TUBE W/FLUORO 05/02/2016 Oley Balm, MD [facility]  . IR GENERIC HISTORICAL  05/15/2016   IR GJ TUBE CHANGE 05/15/2016 Simonne Come, MD [facility]  . IR GENERIC HISTORICAL  06/28/2016   IR GJ TUBE CHANGE 06/28/2016 [facility]  . LAPAROTOMY N/A 01/20/2015   Procedure: EXPLORATORY LAPAROTOMY;  Surgeon: Abigail Miyamoto, MD;  Location: [facility] OR;  Service: General;  Laterality: N/A;  . LYSIS OF ADHESION N/A 01/20/2015   Procedure: LYSIS OF ADHESIONS < 1 HOUR;  Surgeon: Abigail Miyamoto, MD;  Location: [facility] OR;  Service: General;  Laterality: N/A;  . TONSILLECTOMY    . TUBAL LIGATION    . VENTRAL HERNIA REPAIR  2015   incarcerated ventral hernia ([facility] 09/2013)/notes 01/10/2015   Social Hx:  reports that she has never smoked. She has quit using smokeless tobacco. Her smokeless tobacco use included Snuff. She reports that she does not drink alcohol or use drugs.  Allergies  Allergen Reactions  . Penicillins Itching, Rash and Other (See Comments)    Tolerated unasyn, zosyn & cephalosporins in the past.  Has patient had a PCN reaction causing immediate rash, facial/tongue/throat swelling, SOB or lightheadedness with hypotension: Yes Has patient had a PCN reaction causing severe rash involving mucus membranes or skin necrosis: Unk Has patient had a PCN reaction that required hospitalization: Unk Has patient had a PCN reaction occurring within the last 10 years: Unk If all of the above answers are  "NO", then may proceed with Cephalosporins    Family History  Problem Relation Age of Onset  . Stroke Mother   . Hypertension Mother   . Diabetes Brother   . Heart attack Neg Hx     Prior to Admission medications   Medication Sig Start Date End Date Taking? Authorizing Provider  amLODipine (NORVASC) 10 MG tablet TAKE 1 TABLET BY MOUTH DAILY 08/09/16  Yes Lars Masson, MD  atorvastatin (LIPITOR) 10 MG tablet TAKE ONE TABLET BY MOUTH ONCE DAILY 05/22/16  Yes Kirt Boys, DO  clindamycin (CLEOCIN) 300 MG capsule Take 1 capsule (300 mg total) by mouth every 8 (eight) hours. 08/01/16  Yes Rhetta Mura, MD  AMINO ACIDS-PROTEIN HYDROLYS PO Take 30 mLs by mouth 2 (two) times daily.    [provider]  b complex vitamins tablet Take 1 tablet by mouth daily.     [provider]  bisacodyl (DULCOLAX) 10 MG suppository Place 1 suppository (10 mg total) rectally daily as needed for moderate constipation. 09/30/15  Calvert Cantor, MD  calcium-vitamin D (OSCAL WITH D) 500-200 MG-UNIT per tablet Take 1 tablet by mouth daily with breakfast.     [provider]  chlorhexidine (PERIDEX) 0.12 % solution Use as directed 15 mLs in the mouth or throat 2 (two) times daily.    [provider]  Chlorphen-PE-Acetaminophen (SINUS CONGESTION/PAIN DAY/NGHT PO) Take 1 capsule by mouth daily as needed (for sinus irritation).     [provider]  feeding supplement, GLUCERNA SHAKE, (GLUCERNA SHAKE) LIQD Take 237 mLs by mouth daily.     [provider]  fluticasone (FLONASE) 50 MCG/ACT nasal spray Place 2 sprays into both nostrils daily. 08/07/14   Kirt Boys, DO  furosemide (LASIX) 20 MG tablet Take 1 tablet (20 mg total) by mouth daily. 08/07/16   Sharon Seller, NP  gabapentin (NEURONTIN) 300 MG capsule 1 tablet by mouth three times daily, may have additional dose every 8 hours as needed pain 08/07/16   Sharon Seller, NP  Insulin Glargine (LANTUS  SOLOSTAR) 100 UNIT/ML Solostar Pen Inject 5 Units into the skin daily at 10 pm. 06/02/16   Kirt Boys, DO  ipratropium-albuterol (DUONEB) 0.5-2.5 (3) MG/3ML SOLN Take 3 mLs by nebulization every 6 (six) hours as needed (sob). Use 3 times daily x 4 days then every 6 hours as needed. 03/21/16   Janetta Hora, PA-C  isosorbide mononitrate (IMDUR) 30 MG 24 hr tablet Take 30 mg by mouth daily. 07/20/16   [provider]  latanoprost (XALATAN) 0.005 % ophthalmic solution Place 1 drop into both eyes at bedtime. 06/02/14   Oneal Grout, MD  levalbuterol [facility] HFA) 45 MCG/ACT inhaler Inhale 2 puffs into the lungs every 6 (six) hours as needed for wheezing or shortness of breath. 02/03/16   Kirt Boys, DO  levothyroxine (SYNTHROID, LEVOTHROID) 137 MCG tablet TAKE 1 TABLET (137 MCG TOTAL) BY MOUTH DAILY BEFORE BREAKFAST. 07/31/16   Kimber Relic, MD  losartan (COZAAR) 50 MG tablet Take 50 mg by mouth daily. 06/16/16   [provider]  Maltodextrin-Xanthan Gum (RESOURCE THICKENUP CLEAR) POWD Take 120 g by mouth as needed (use to thicken liquids.). 11/11/15   Rodolph Bong, MD  Multiple Vitamin (MULTIVITAMIN WITH MINERALS) TABS tablet Take 1 tablet by mouth daily.    [provider]  Nutritional Supplements (FEEDING SUPPLEMENT, OSMOLITE 1.5 CAL,) LIQD Place 1,000 mLs into feeding tube daily. 09/30/15   Calvert Cantor, MD  Polyethyl Glycol-Propyl Glycol 0.4-0.3 % SOLN Place 1 drop into both eyes 4 (four) times daily.     [provider]  polyethylene glycol (MIRALAX / GLYCOLAX) packet Take 17 g by mouth daily as needed for mild constipation.    [provider]  RESTASIS 0.05 % ophthalmic emulsion USE 1 DROP INTO BOTH EYES TWICE DAILY 06/28/16   Kimber Relic, MD  sertraline (ZOLOFT) 100 MG tablet TAKE ONE TABLET BY MOUTH ONCE DAILY FOR DEPRESSION 04/10/16   Kirt Boys, DO  traMADol (ULTRAM) 50 MG tablet Take 50 mg by mouth See admin instructions. Every  4-6 hours as needed for pain 05/01/16   [provider]  warfarin (COUMADIN) 2.5 MG tablet TAKE AS DIRECTED BY COUMADIN CLINIC. Patient taking differently: Monday 2.5mg  and all other days 3.75mg  06/22/16   Lars Masson, MD  Water For Irrigation, Sterile (FREE WATER) SOLN Place 230 mLs into feeding tube 3 (three) times daily.    [provider]    Physical Exam: Vitals:   08/12/16 1335  08/12/16 1500  BP: (!) 173/63 (!) 180/67  Pulse: 98 97  Resp: (!) 28 (!) 21  Temp: 98.8 F (37.1 C)   TempSrc: Oral   SpO2: 99% 91%    Constitutional: NAD, calm, comfortable Vitals:   08/12/16 1335 08/12/16 1500  BP: (!) 173/63 (!) 180/67  Pulse: 98 97  Resp: (!) 28 (!) 21  Temp: 98.8 F (37.1 C)   TempSrc: Oral   SpO2: 99% 91%   Eyes: PERRL, lids and conjunctivae normal ENMT: Mucous membranes are dry. Posterior pharynx clear of any exudate or lesions.Normal dentition.  Neck: normal, supple, no masses, no thyromegaly Respiratory: course breath sounds bilaterally with exp wheezing  Cardiovascular: Regular rate and rhythm, no rubs / gallops. No extremity edema. 2+ pedal pulses. No carotid bruits.  Abdomen: no tenderness, no masses palpated. No hepatosplenomegaly. Bowel sounds positive.  Musculoskeletal: no clubbing / cyanosis. No joint deformity upper and lower extremities. Skin: no rashes, lesions, ulcers. No induration Neurologic: CN 2-12 grossly intact. Sensation intact, DTR normal.  Psychiatric: Normal mood  Labs on Admission: I have personally reviewed following labs and imaging studies  CBC:  Recent Labs Lab 08/07/16 1424 08/12/16 1400  WBC 8.3 8.5  NEUTROABS 4,648  --   HGB 8.6* 8.5*  HCT 27.3* 25.7*  MCV 90.4 88.0  PLT 495* 369   Basic Metabolic Panel:  Recent Labs Lab 08/07/16 1424 08/12/16 1400  NA 133* 133*  K 5.3 4.5  CL 100 98*  CO2 25 26  GLUCOSE 170* 186*  BUN 58* 62*  CREATININE 1.20* 1.03*  CALCIUM 9.2 9.3   Coagulation  Profile:  Recent Labs Lab 08/11/16 1433 08/12/16 1429  INR 2.4 1.98   BNP (last 3 results)  Recent Labs  05/08/16 1053  PROBNP 1,132*   Urine analysis:    Component Value Date/Time   COLORURINE YELLOW 06/08/2016 1647   APPEARANCEUR CLEAR 06/08/2016 1647   LABSPEC 1.006 06/08/2016 1647   PHURINE 8.0 06/08/2016 1647   GLUCOSEU NEGATIVE 06/08/2016 1647   HGBUR NEGATIVE 06/08/2016 1647   BILIRUBINUR NEGATIVE 06/08/2016 1647   KETONESUR NEGATIVE 06/08/2016 1647   PROTEINUR NEGATIVE 06/08/2016 1647   UROBILINOGEN 0.2 01/27/2015 2308   NITRITE NEGATIVE 06/08/2016 1647   LEUKOCYTESUR NEGATIVE 06/08/2016 1647  \ Radiological Exams on Admission: Dg Chest 2 View  Result Date: 08/12/2016 CLINICAL DATA:  Shortness of breath and cough today. EXAM: CHEST  2 VIEW COMPARISON:  06/18/2016 and prior chest radiograph FINDINGS: Cardiomegaly and mild pulmonary vascular congestion noted. Elevation of the right hemidiaphragm is again identified. There is no evidence of focal airspace disease, pulmonary edema, suspicious pulmonary nodule/mass, pleural effusion, or pneumothorax. No acute bony abnormalities are identified. IMPRESSION: Cardiomegaly with mild pulmonary vascular congestion. Electronically Signed   By: Harmon Pier M.D.   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

## 2016-08-12 NOTE — ED Provider Notes (Signed)
[facility] DEPT Provider Note   CSN: [id number] Arrival date & time: 08/12/16  1326     History   Chief Complaint Chief Complaint  Patient presents with  . Shortness of Breath    HPI Shannon [name] is a 83 y.o. female.  HPI  Pt presentign with c/o shortness of breath and wheezing. Pt states she has been having cold symptoms for the past several days and breathing has been getting worse since last night.  She tried using her nebulizer at home but it did not help very much.  She has had a productive cough.  No fever/chills.  EMS gave duoneb en route which did help somewhat.  No chest pain.  No leg swelling.  There are no other associated systemic symptoms, there are no other alleviating or modifying factors.   Past Medical History:  Diagnosis Date  . Anemia    previous blood transfusions  . Arthritis    "all over"  . Asthma   . Bradycardia    requiring previous d/c of BB and reduction of amiodarone  . CAD (coronary artery disease)    nonobstructive per notes  . Chronic diastolic CHF (congestive heart failure) ([place])   . CKD (chronic kidney disease), stage III   . Complication of blood transfusion    "got the wrong blood type at [facility] in ~ 2015; no adverse reaction that we are aware of"/daughter, [name] (01/27/2016)  . COPD (chronic obstructive pulmonary disease) ([place])   . Depression    "light case"  . DVT (deep venous thrombosis) ([facility]) 01/2016   a. LLE DVT 01/2016 - switched from Eliquis to Coumadin.  . Gastric stenosis    a. s/p stomach tube  . GERD (gastroesophageal reflux disease)   . History of blood transfusion    "several" (01/27/2016)  . History of stomach ulcers   . Hyperlipidemia   . Hypertension   . Hypothyroidism   . Paraesophageal hernia   . Perforated gastric ulcer ([place])   . Seasonal allergies   . SIADH (syndrome of inappropriate ADH production) ([place])    [name] 01/10/2015  . Small bowel obstruction ([place])    "I don't know how many" (01/11/2015)  .  Stroke ([place])    "light one"  . Type II diabetes mellitus ([place])    "related to prednisone use  for > 20 yr; once predinose stopped; no more DM RX" (01/27/2016)  . Ventral hernia with bowel obstruction     Patient Active Problem List   Diagnosis Date Noted  . COPD (chronic obstructive pulmonary disease) ([place]) 08/12/2016  . COPD exacerbation ([place])   . Atherosclerosis of native arteries of the extremities with gangrene ([place]) 07/28/2016  . Gangrene ([place]) 07/28/2016  . Calcium channel blocker adverse reaction 06/08/2016  . Elevated TSH   . Aspiration pneumonitis ([place])   . Other specified hypothyroidism   . Acute respiratory failure with hypoxia ([place]) 05/22/2016  . HCAP (healthcare-associated pneumonia)   . Acute on chronic congestive heart failure ([place])   . CHF (NYHA class IV, ACC/AHA stage D) ([place])   . Advance care planning   . Palliative care by specialist   . Shortness of breath   . Pressure injury of skin 03/11/2016  . PEG (percutaneous endoscopic gastrostomy) status ([place]) 03/10/2016  . Acute on chronic diastolic congestive heart failure ([place]) 03/02/2016  . Encounter for therapeutic drug monitoring 02/16/2016  . Sepsis ([place]) 02/08/2016  . UTI (urinary tract infection) 02/08/2016  . Warfarin anticoagulation   . Chronic  seasonal allergic rhinitis   . Sinus pain   . Acute renal insufficiency 01/26/2016  . Acute deep vein thrombosis (DVT) of left lower extremity ([place])   . Acute respiratory failure ([place])   . Respiratory alkalosis   . Atrial flutter ([place]) 01/24/2016  . Chronic diastolic CHF (congestive heart failure) ([place]) 01/24/2016  . Infection 01/24/2016  . Bilateral leg edema 12/29/2015  . Hypothyroidism due to medication   . Weakness   . Pyloric stenosis   . Pressure ulcer 09/15/2015  . Hyperkalemia 09/12/2015  . Acute osteomyelitis of toe of right foot ([place])   . PVD (peripheral vascular disease) ([place])   . Adjustment disorder with anxiety 08/29/2015  . Diabetic ulcer of  toe of right foot associated with type 2 diabetes mellitus, with necrosis of muscle ([place])   . Diverticulitis of large intestine without perforation or abscess without bleeding   . Colitis 08/26/2015  . Acute on chronic diastolic CHF (congestive heart failure), NYHA class 4 ([place]) 08/02/2015  . Esophageal candidiasis ([place])   . Goals of care, counseling/discussion   . Dyspnea   . SOB (shortness of breath)   . Acute renal failure superimposed on stage 4 chronic kidney disease ([place])   . Encounter for long-term (current) use of other high-risk medications   . Respiratory distress 07/06/2015  . Dysphagia 07/05/2015  . Asthma with allergic rhinitis 06/04/2015  . Hypertensive heart disease 03/18/2015  . Malnutrition of moderate degree 01/28/2015  . Syncope 01/27/2015  . Fever, unspecified 01/22/2015  . Symptomatic bradycardia 01/21/2015  . Hypotension due to drugs 01/21/2015  . Palliative care encounter   . DNR (do not resuscitate) discussion   . Protein-calorie malnutrition, severe 01/12/2015  . Elevated troponin 01/12/2015  . Essential hypertension 01/12/2015  . AP (abdominal pain) 01/10/2015  . Cerebrovascular disease 07/14/2014  . Diabetic polyneuropathy associated with type 2 diabetes mellitus ([place]) 07/14/2014  . Hyperlipidemia 07/14/2014  . Gastric outlet obstruction 05/21/2014  . Unstable gait 04/29/2014  . Hyponatremia 03/23/2014  . Acute encephalopathy 03/22/2014  . Slurred speech   . Gastroparesis   . Slow transit constipation 02/03/2014  . Carotid stenosis 02/03/2014  . Demand ischemia ([place]) 01/21/2014  . Gastric bezoar 01/20/2014  . Anemia of chronic disease 01/18/2014  . Chronic kidney insufficiency, stage 3 (moderate) 01/18/2014  . Depression 09/29/2013  . Essential hypertension, benign 09/22/2013  . GERD (gastroesophageal reflux disease) 09/22/2013  . Dyslipidemia 09/22/2013  . Diabetes mellitus type 2 in nonobese ([place]) 09/22/2013  . Persistent atrial fibrillation  ([place]) 09/22/2013  . CVA (cerebral vascular accident) ([place]) 09/22/2013  . Diabetic neuropathy ([place]) 09/22/2013  . Hypothyroidism 09/22/2013  . Chronic gastrojejunal ulcer with perforation ([place]) 09/07/2013  . Accelerated hypertension 09/07/2013  . Systemic lupus erythematosus (SLE) inhibitor 09/07/2013  . Other specified abnormal immunological findings in serum 09/07/2013    Past Surgical History:  Procedure Laterality Date  . CATARACT EXTRACTION W/ INTRAOCULAR LENS  IMPLANT, BILATERAL    . CHOLECYSTECTOMY OPEN    . COLECTOMY    . ESOPHAGOGASTRODUODENOSCOPY N/A 01/19/2014   Procedure: ESOPHAGOGASTRODUODENOSCOPY (EGD);  Surgeon: Irene Shipper, MD;  Location: [facility];  Service: Endoscopy;  Laterality: N/A;  . ESOPHAGOGASTRODUODENOSCOPY N/A 01/20/2014   Procedure: ESOPHAGOGASTRODUODENOSCOPY (EGD);  Surgeon: Irene Shipper, MD;  Location: [facility];  Service: Endoscopy;  Laterality: N/A;  . ESOPHAGOGASTRODUODENOSCOPY N/A 03/19/2014   Procedure: ESOPHAGOGASTRODUODENOSCOPY (EGD);  Surgeon: Milus Banister, MD;  Location: [facility];  Service: Endoscopy;  Laterality: N/A;  . ESOPHAGOGASTRODUODENOSCOPY N/A 07/08/2015  Procedure: ESOPHAGOGASTRODUODENOSCOPY (EGD);  Surgeon: Doran Stabler, MD;  Location[facility] ENDOSCOPY;  Service: Endoscopy;  Laterality: N/A;  . ESOPHAGOGASTRODUODENOSCOPY (EGD) WITH PROPOFOL N/A 09/15/2015   Procedure: ESOPHAGOGASTRODUODENOSCOPY (EGD) WITH PROPOFOL;  Surgeon: Manus Gunning, MD;  Location: [facility] ENDOSCOPY;  Service: Gastroenterology;  Laterality: N/A;  . GASTROJEJUNOSTOMY     hx/notes 01/10/2015  . GASTROJEJUNOSTOMY N/A 09/23/2015   Procedure: OPEN GASTROJEJUNOSTOMY TUBE PLACEMENT;  Surgeon: Arta Bruce Kinsinger, MD;  Location: [facility] ORS;  Service: General;  Laterality: N/A;  . GLAUCOMA SURGERY Bilateral   . HERNIA REPAIR  2015  . IR GENERIC HISTORICAL  01/07/2016   IR GJ TUBE CHANGE 01/07/2016 Jacqulynn Cadet, MD [facility]INTER[facility]  . IR GENERIC HISTORICAL   01/27/2016   IR MECH REMOV OBSTRUC MAT ANY COLON TUBE W/FLUORO 01/27/2016 Markus Daft, MD [facility]  . IR GENERIC HISTORICAL  02/07/2016   IR PATIENT EVAL TECH 0-60 MINS Darrell K Allred, PA-C [facility]  . IR GENERIC HISTORICAL  02/08/2016   IR GJ TUBE CHANGE 02/08/2016 Greggory Keen, MD [facility]  . IR GENERIC HISTORICAL  01/06/2016   IR GJ TUBE CHANGE 01/06/2016 [facility]-RAD OUT REF  . IR GENERIC HISTORICAL  05/02/2016   IR CM INJ ANY COLONIC TUBE W/FLUORO 05/02/2016 Arne Cleveland, MD [facility]  . IR GENERIC HISTORICAL  05/15/2016   IR GJ TUBE CHANGE 05/15/2016 Sandi Mariscal, MD [facility]  . IR GENERIC HISTORICAL  06/28/2016   IR GJ TUBE CHANGE 06/28/2016 [facility]  . LAPAROTOMY N/A 01/20/2015   Procedure: EXPLORATORY LAPAROTOMY;  Surgeon: Coralie Keens, MD;  Location: [facility];  Service: General;  Laterality: N/A;  . LYSIS OF ADHESION N/A 01/20/2015   Procedure: LYSIS OF ADHESIONS < 1 HOUR;  Surgeon: Coralie Keens, MD;  Location: [facility];  Service: General;  Laterality: N/A;  . TONSILLECTOMY    . TUBAL LIGATION    . VENTRAL HERNIA REPAIR  2015   incarcerated ventral hernia ([facility] 09/2013)/notes 01/10/2015    OB History    No data available       Home Medications    Prior to Admission medications   Medication Sig Start Date End Date Taking? Authorizing Provider  acetaminophen (TYLENOL) 500 MG tablet Take 1,000 mg by mouth every 6 (six) hours as needed (pain).   Yes [provider]  AMINO ACIDS-PROTEIN HYDROLYS PO Take 30 mLs by mouth 2 (two) times daily.   Yes [provider]  amLODipine (NORVASC) 10 MG tablet TAKE 1 TABLET BY MOUTH DAILY 08/09/16  Yes Dorothy Spark, MD  atorvastatin (LIPITOR) 10 MG tablet TAKE ONE TABLET BY MOUTH ONCE DAILY 05/22/16  Yes Gildardo Cranker, DO  b complex vitamins tablet Take 1 tablet by mouth daily.    Yes [provider]  calcium-vitamin D (OSCAL WITH D) 500-200 MG-UNIT per tablet Take 1 tablet by mouth daily with  breakfast.    Yes [provider]  chlorhexidine (PERIDEX) 0.12 % solution Use as directed 15 mLs in the mouth or throat 2 (two) times daily.   Yes [provider]  clindamycin (CLEOCIN) 300 MG capsule Take 1 capsule (300 mg total) by mouth every 8 (eight) hours. Patient taking differently: Take 300 mg by mouth every 8 (eight) hours. [id number] 08/01/16  Yes Nita Sells, MD  fluticasone (FLONASE) 50 MCG/ACT nasal spray Place 2 sprays into both nostrils daily. 08/07/14  Yes Eulas Post, Monica, DO  furosemide (LASIX) 20 MG tablet Take 1 tablet (20 mg total) by mouth  daily. 08/07/16  Yes [name], NP  gabapentin (NEURONTIN) 300 MG capsule 1 tablet by mouth three times daily, may have additional dose every 8 hours as needed pain Patient taking differently: Take 300-600 mg by mouth 3 (three) times daily. Take 600mg  by mouth in the morning, 300mg  by mouth in the afternoon, and 377my by mouth at bedtime 08/07/16  Yes [name], NP  Insulin Glargine (LANTUS SOLOSTAR) 100 UNIT/ML Solostar Pen Inject 5 Units into the skin daily at 10 pm. 06/02/16  Yes Gildardo Cranker, DO  ipratropium-albuterol (DUONEB) 0.5-2.5 (3) MG/3ML SOLN Take 3 mLs by nebulization every 6 (six) hours as needed (sob). Use 3 times daily x 4 days then every 6 hours as needed. 03/21/16  Yes Eileen Stanford, PA-C  isosorbide mononitrate (IMDUR) 30 MG 24 hr tablet Take 30 mg by mouth daily. 07/20/16  Yes [provider]  latanoprost (XALATAN) 0.005 % ophthalmic solution Place 1 drop into both eyes at bedtime. 06/02/14  Yes Blanchie Serve, MD  levalbuterol [facility] HFA) 45 MCG/ACT inhaler Inhale 2 puffs into the lungs every 6 (six) hours as needed for wheezing or shortness of breath. 02/03/16  Yes Eulas Post, Monica, DO  levothyroxine (SYNTHROID, LEVOTHROID) 137 MCG tablet TAKE 1 TABLET (137 MCG TOTAL) BY MOUTH DAILY BEFORE BREAKFAST. 07/31/16  Yes Estill Dooms, MD  losartan (COZAAR) 50 MG tablet Take 50  mg by mouth daily. 06/16/16  Yes [provider]  Maltodextrin-Xanthan Gum (RESOURCE THICKENUP CLEAR) POWD Take 120 g by mouth as needed (use to thicken liquids.). 11/11/15  Yes Eugenie Filler, MD  Multiple Vitamin (MULTIVITAMIN WITH MINERALS) TABS tablet Take 1 tablet by mouth daily.   Yes [provider]  Nutritional Supplements (FEEDING SUPPLEMENT, OSMOLITE 1.5 CAL,) LIQD Place 1,000 mLs into feeding tube daily. 09/30/15  Yes Debbe Odea, MD  Polyethyl Glycol-Propyl Glycol 0.4-0.3 % SOLN Place 1 drop into both eyes 4 (four) times daily.    Yes [provider]  polyethylene glycol (MIRALAX / GLYCOLAX) packet Take 17 g by mouth daily as needed for mild constipation.   Yes [provider]  RESTASIS 0.05 % ophthalmic emulsion USE 1 DROP INTO BOTH EYES TWICE DAILY 06/28/16  Yes Estill Dooms, MD  sertraline (ZOLOFT) 100 MG tablet TAKE ONE TABLET BY MOUTH ONCE DAILY FOR DEPRESSION 04/10/16  Yes Gildardo Cranker, DO  Water For Irrigation, Sterile (FREE WATER) SOLN Place 230 mLs into feeding tube 3 (three) times daily.   Yes [provider]  warfarin (COUMADIN) 2.5 MG tablet TAKE AS DIRECTED BY COUMADIN CLINIC. 08/15/16   Dorothy Spark, MD    Family History Family History  Problem Relation Age of Onset  . Stroke Mother   . Hypertension Mother   . Diabetes Brother   . Heart attack Neg Hx     Social History Social History  Substance Use Topics  . Smoking status: Never Smoker  . Smokeless tobacco: Former Systems developer    Types: Snuff     Comment: "used snuff in my younger days"  . Alcohol use No     Allergies   Penicillins and Other   Review of Systems Review of Systems  ROS reviewed and all otherwise negative except for mentioned in HPI   Physical Exam Updated Vital Signs BP (!) 152/67 (BP Location: Left Arm)   Pulse 100   Temp 98.1 F (36.7 C) (Oral)   Resp 18   Ht 5\' 6"  (1.676 m)   Wt 65.4  kg   SpO2 99%   BMI 23.27 kg/m  Vitals  reviewed Physical Exam Physical Examination: General appearance - alert, frail appearing, and in no distress Mental status - alert, oriented to person, place, and time Eyes - no conjunctival injection, no scleral icterus Mouth - mucous membranes moist, pharynx normal without lesions Neck - supple, no significant adenopathy Chest - increased respiratory effort, bilateral wheezing on expiration with some decreased air movement Heart - normal rate, regular rhythm, normal S1, S2, no murmurs, rubs, clicks or gallops Abdomen - soft, nontender, nondistended, no masses or organomegaly Neurological - alert, oriented, normal speech Extremities - peripheral pulses normal, no pedal edema, no clubbing or cyanosis Skin - normal coloration and turgor, no rashes  ED Treatments / Results  Labs (all labs ordered are listed, but only abnormal results are displayed) Labs Reviewed  CBC - Abnormal; Notable for the following:       Result Value   RBC 2.92 (*)    Hemoglobin 8.5 (*)    HCT 25.7 (*)    RDW 17.5 (*)    All other components within normal limits  BASIC METABOLIC PANEL - Abnormal; Notable for the following:    Sodium 133 (*)    Chloride 98 (*)    Glucose, Bld 186 (*)    BUN 62 (*)    Creatinine, Ser 1.03 (*)    GFR calc non Af Amer 46 (*)    GFR calc Af Amer 53 (*)    All other components within normal limits  PROTIME-INR - Abnormal; Notable for the following:    Prothrombin Time 22.8 (*)    All other components within normal limits  BASIC METABOLIC PANEL - Abnormal; Notable for the following:    Chloride 100 (*)    Glucose, Bld 221 (*)    BUN 63 (*)    Creatinine, Ser 1.16 (*)    GFR calc non Af Amer 40 (*)    GFR calc Af Amer 46 (*)    All other components within normal limits  CBC - Abnormal; Notable for the following:    WBC 11.0 (*)    RBC 2.98 (*)    Hemoglobin 8.7 (*)    HCT 26.3 (*)    RDW 17.8 (*)    All other components within normal limits  PROTIME-INR - Abnormal;  Notable for the following:    Prothrombin Time 21.5 (*)    All other components within normal limits  GLUCOSE, CAPILLARY - Abnormal; Notable for the following:    Glucose-Capillary 178 (*)    All other components within normal limits  GLUCOSE, CAPILLARY - Abnormal; Notable for the following:    Glucose-Capillary 222 (*)    All other components within normal limits  GLUCOSE, CAPILLARY - Abnormal; Notable for the following:    Glucose-Capillary 228 (*)    All other components within normal limits  PROTIME-INR - Abnormal; Notable for the following:    Prothrombin Time 19.6 (*)    All other components within normal limits  CBC WITH DIFFERENTIAL/PLATELET - Abnormal; Notable for the following:    WBC 16.6 (*)    RBC 3.09 (*)    Hemoglobin 9.0 (*)    HCT 27.0 (*)    RDW 18.0 (*)    Neutro Abs 15.8 (*)    Lymphs Abs 0.5 (*)    All other components within normal limits  COMPREHENSIVE METABOLIC PANEL - Abnormal; Notable for the following:    Glucose, Bld 238 (*)  BUN 55 (*)    Creatinine, Ser 1.07 (*)    Albumin 3.2 (*)    AST 59 (*)    GFR calc non Af Amer 44 (*)    GFR calc Af Amer 51 (*)    All other components within normal limits  GLUCOSE, CAPILLARY - Abnormal; Notable for the following:    Glucose-Capillary 222 (*)    All other components within normal limits  GLUCOSE, CAPILLARY - Abnormal; Notable for the following:    Glucose-Capillary 217 (*)    All other components within normal limits  GLUCOSE, CAPILLARY - Abnormal; Notable for the following:    Glucose-Capillary 285 (*)    All other components within normal limits  GLUCOSE, CAPILLARY - Abnormal; Notable for the following:    Glucose-Capillary 276 (*)    All other components within normal limits  PROTIME-INR - Abnormal; Notable for the following:    Prothrombin Time 22.6 (*)    All other components within normal limits  CBC WITH DIFFERENTIAL/PLATELET - Abnormal; Notable for the following:    WBC 12.1 (*)    RBC  3.07 (*)    Hemoglobin 8.9 (*)    HCT 27.1 (*)    RDW 18.0 (*)    Neutro Abs 11.0 (*)    All other components within normal limits  COMPREHENSIVE METABOLIC PANEL - Abnormal; Notable for the following:    Potassium 3.3 (*)    Glucose, Bld 451 (*)    BUN 64 (*)    Creatinine, Ser 1.11 (*)    Albumin 3.1 (*)    AST 51 (*)    GFR calc non Af Amer 42 (*)    GFR calc Af Amer 49 (*)    All other components within normal limits  PHOSPHORUS - Abnormal; Notable for the following:    Phosphorus 2.4 (*)    All other components within normal limits  GLUCOSE, CAPILLARY - Abnormal; Notable for the following:    Glucose-Capillary 309 (*)    All other components within normal limits  GLUCOSE, CAPILLARY - Abnormal; Notable for the following:    Glucose-Capillary 363 (*)    All other components within normal limits  GLUCOSE, CAPILLARY - Abnormal; Notable for the following:    Glucose-Capillary 481 (*)    All other components within normal limits  GLUCOSE, CAPILLARY - Abnormal; Notable for the following:    Glucose-Capillary 361 (*)    All other components within normal limits  RESPIRATORY PANEL BY PCR  BRAIN NATRIURETIC PEPTIDE  MAGNESIUM  PHOSPHORUS  MAGNESIUM    EKG  EKG Interpretation  Date/Time:  Saturday Aug 12 2016 13:54:36 EDT Ventricular Rate:  94 PR Interval:    QRS Duration: 75 QT Interval:  363 QTC Calculation: 454 R Axis:   90 Text Interpretation:  Sinus rhythm Borderline right axis deviation Since previous tracing rate has increased Confirmed by Canary Brim  MD, Azaliah Carrero [phone]) on 08/12/2016 1:58:00 PM Also confirmed by Canary Brim  MD, [place] [phone]), editor [name] [phone])  on 08/12/2016 3:38:41 PM       Radiology Dg Chest Port 1 View  Result Date: 08/14/2016 CLINICAL DATA:  Shortness of breath.  Cough. EXAM: PORTABLE CHEST 1 VIEW COMPARISON:  08/12/2016. FINDINGS: Mediastinum hilar structures are normal. Cardiomegaly. Low lung volumes with bibasilar atelectasis.  Left base infiltrate cannot be excluded. Tiny left pleural effusion cannot be excluded. No pneumothorax. IMPRESSION: 1. Low lung volumes with bibasilar atelectasis. Left base infiltrate cannot be excluded. Small left pleural effusion cannot  be excluded. 2. Stable cardiomegaly. Electronically Signed   By: Marcello Moores  Register   On: 08/14/2016 06:41    Procedures Procedures (including critical care time)  Medications Ordered in ED Medications  sodium chloride flush (NS) 0.9 % injection 3 mL (3 mLs Intravenous Given 08/14/16 2124)  ondansetron (ZOFRAN) tablet 4 mg (not administered)    Or  ondansetron (ZOFRAN) injection 4 mg (not administered)  albuterol (PROVENTIL) (2.5 MG/3ML) 0.083% nebulizer solution 2.5 mg (not administered)  hydrALAZINE (APRESOLINE) injection 5 mg (not administered)  methylPREDNISolone sodium succinate (SOLU-MEDROL) 40 mg/mL injection 40 mg (40 mg Intravenous Given 08/15/16 0807)  ipratropium-albuterol (DUONEB) 0.5-2.5 (3) MG/3ML nebulizer solution 3 mL (3 mLs Nebulization Given 08/15/16 0838)  acetaminophen (TYLENOL) tablet 1,000 mg (1,000 mg Oral Given 08/14/16 2122)  amLODipine (NORVASC) tablet 10 mg (10 mg Oral Given 08/14/16 1142)  atorvastatin (LIPITOR) tablet 10 mg (10 mg Oral Given 08/14/16 1143)  fluticasone (FLONASE) 50 MCG/ACT nasal spray 2 spray (2 sprays Each Nare Given 08/14/16 1145)  furosemide (LASIX) tablet 20 mg (20 mg Oral Given 08/14/16 1143)  isosorbide mononitrate (IMDUR) 24 hr tablet 30 mg (30 mg Oral Given 08/14/16 1143)  latanoprost (XALATAN) 0.005 % ophthalmic solution 1 drop (1 drop Both Eyes Given 08/14/16 2124)  levothyroxine (SYNTHROID, LEVOTHROID) tablet 137 mcg (137 mcg Oral Given 08/15/16 0809)  losartan (COZAAR) tablet 50 mg (50 mg Oral Given 08/14/16 1143)  sertraline (ZOLOFT) tablet 50 mg (50 mg Oral Given 08/14/16 1143)  Warfarin - Pharmacist Dosing Inpatient ( Does not apply Duplicate 08/29/39 3244)  guaiFENesin (MUCINEX) 12 hr tablet 1,200 mg (1,200  mg Oral Given 08/14/16 2122)  azithromycin (ZITHROMAX) 500 mg in dextrose 5 % 250 mL IVPB (0 mg Intravenous Stopped 08/14/16 1017)  clindamycin (CLEOCIN) capsule 300 mg (300 mg Oral Given 08/15/16 0517)  gabapentin (NEURONTIN) capsule 600 mg (600 mg Oral Given 08/15/16 0809)  gabapentin (NEURONTIN) capsule 300 mg (300 mg Oral Given 08/14/16 2125)  feeding supplement (OSMOLITE 1.5 CAL) liquid 1,000 mL (0 mLs Per Tube Stopped 08/15/16 0922)  feeding supplement (NEPRO CARB STEADY) liquid 237 mL (237 mLs Oral New Bag/Given 08/14/16 1304)  feeding supplement (PRO-STAT SUGAR FREE 64) liquid 30 mL (30 mLs Per Tube Given 08/14/16 2122)  cycloSPORINE (RESTASIS) 0.05 % ophthalmic emulsion 1 drop (1 drop Both Eyes Given 08/14/16 2123)  polyvinyl alcohol (LIQUIFILM TEARS) 1.4 % ophthalmic solution 1 drop (1 drop Both Eyes Given 08/14/16 2124)  warfarin (COUMADIN) tablet 3.75 mg (not administered)  insulin aspart (novoLOG) injection 0-15 Units (15 Units Subcutaneous Given 08/15/16 0808)  insulin glargine (LANTUS) injection 5 Units (not administered)  potassium chloride 20 MEQ/15ML (10%) solution 40 mEq (not administered)  albuterol (PROVENTIL) (2.5 MG/3ML) 0.083% nebulizer solution 5 mg (5 mg Nebulization Given 08/12/16 1400)  ipratropium (ATROVENT) nebulizer solution 0.5 mg (0.5 mg Nebulization Given 08/12/16 1400)  methylPREDNISolone sodium succinate (SOLU-MEDROL) 125 mg/2 mL injection 125 mg (125 mg Intravenous Given 08/12/16 1357)  albuterol (PROVENTIL) (2.5 MG/3ML) 0.083% nebulizer solution 5 mg (5 mg Nebulization Given 08/12/16 1451)  ipratropium (ATROVENT) nebulizer solution 0.5 mg (0.5 mg Nebulization Given 08/12/16 1451)  warfarin (COUMADIN) tablet 4 mg (4 mg Oral Given 08/13/16 1724)  food thickener (THICK IT) powder ( Oral Given 08/13/16 1446)  gabapentin (NEURONTIN) capsule 300 mg (300 mg Oral Given 08/13/16 2242)  warfarin (COUMADIN) tablet 4 mg (4 mg Oral Given 08/14/16 1843)  insulin aspart (novoLOG) injection  10 Units (10 Units Subcutaneous Given 08/15/16 0434)   CRITICAL CARE Performed  by: Threasa Beards Total critical care time: 40 minutes Critical care time was exclusive of separately billable procedures and treating other patients. Critical care was necessary to treat or prevent imminent or life-threatening deterioration. Critical care was time spent personally by me on the following activities: development of treatment plan with patient and/or surrogate as well as nursing, discussions with consultants, evaluation of patient's response to treatment, examination of patient, obtaining history from patient or surrogate, ordering and performing treatments and interventions, ordering and review of laboratory studies, ordering and review of radiographic studies, pulse oximetry and re-evaluation of patient's condition.  Initial Impression / Assessment and Plan / ED Course  I have reviewed the triage vital signs and the nursing notes.  Pertinent labs & imaging results that were available during my care of the patient were reviewed by me and considered in my medical decision making (see chart for details).    3:21 PM  D/w Dr. Doyle Askew, triad for admission.    Pt is improving after 3 duonebs in the ED.  RR has improved from 28 to 21.  She has also received solumedrol.  No pneumonia on CXR.  BNP is not elevated, do not feel this is c/w a CHF exacerbation today.  He rrenal function is at her baseline, anemia is at her baseline.     Final Clinical Impressions(s) / ED Diagnoses   Final diagnoses:  COPD exacerbation ([place])  Anemia, unspecified type  Renal insufficiency    New Prescriptions Current Discharge Medication List    START taking these medications   Details  warfarin (COUMADIN) 2.5 MG tablet TAKE AS DIRECTED BY [facility]. Qty: 40 tablet, Refills: 1         Alfonzo Beers, MD 08/15/16 [phone]

## 2016-08-12 NOTE — ED Notes (Signed)
Patient transported to X-ray 

## 2016-08-13 DIAGNOSIS — N289 Disorder of kidney and ureter, unspecified: Secondary | ICD-10-CM

## 2016-08-13 DIAGNOSIS — D649 Anemia, unspecified: Secondary | ICD-10-CM

## 2016-08-13 DIAGNOSIS — J449 Chronic obstructive pulmonary disease, unspecified: Secondary | ICD-10-CM

## 2016-08-13 DIAGNOSIS — R0602 Shortness of breath: Secondary | ICD-10-CM

## 2016-08-13 DIAGNOSIS — E785 Hyperlipidemia, unspecified: Secondary | ICD-10-CM

## 2016-08-13 DIAGNOSIS — F329 Major depressive disorder, single episode, unspecified: Secondary | ICD-10-CM

## 2016-08-13 DIAGNOSIS — E039 Hypothyroidism, unspecified: Secondary | ICD-10-CM

## 2016-08-13 LAB — RESPIRATORY PANEL BY PCR

## 2016-08-13 LAB — CBC
HCT: 26.3 % — ABNORMAL LOW (ref 36.0–46.0)
Hemoglobin: 8.7 g/dL — ABNORMAL LOW (ref 12.0–15.0)
MCH: 29.2 pg (ref 26.0–34.0)
MCHC: 33.1 g/dL (ref 30.0–36.0)
MCV: 88.3 fL (ref 78.0–100.0)
Platelets: 373 10*3/uL (ref 150–400)
RBC: 2.98 MIL/uL — ABNORMAL LOW (ref 3.87–5.11)
RDW: 17.8 % — ABNORMAL HIGH (ref 11.5–15.5)
WBC: 11 10*3/uL — ABNORMAL HIGH (ref 4.0–10.5)

## 2016-08-13 LAB — GLUCOSE, CAPILLARY
Glucose-Capillary: 178 mg/dL — ABNORMAL HIGH (ref 65–99)
Glucose-Capillary: 222 mg/dL — ABNORMAL HIGH (ref 65–99)
Glucose-Capillary: 228 mg/dL — ABNORMAL HIGH (ref 65–99)
Glucose-Capillary: 222 mg/dL — ABNORMAL HIGH (ref 65–99)

## 2016-08-13 LAB — BASIC METABOLIC PANEL
Anion gap: 11 (ref 5–15)
BUN: 63 mg/dL — ABNORMAL HIGH (ref 6–20)
CO2: 25 mmol/L (ref 22–32)
Calcium: 10 mg/dL (ref 8.9–10.3)
Chloride: 100 mmol/L — ABNORMAL LOW (ref 101–111)
Creatinine, Ser: 1.16 mg/dL — ABNORMAL HIGH (ref 0.44–1.00)
GFR calc Af Amer: 46 mL/min — ABNORMAL LOW (ref >60)
GFR calc non Af Amer: 40 mL/min — ABNORMAL LOW (ref >60)
Glucose, Bld: 221 mg/dL — ABNORMAL HIGH (ref 65–99)
Potassium: 4.7 mmol/L (ref 3.5–5.1)
Sodium: 136 mmol/L (ref 135–145)

## 2016-08-13 LAB — PROTIME-INR
INR: 1.84
Prothrombin Time: 21.5 seconds — ABNORMAL HIGH (ref 11.4–15.2)

## 2016-08-13 IMAGING — RF DG ESOPHAGUS
14 series · 14 of 14 positions shown · non-contrast
Comparison: CT abdomen pelvis dated 03/12/2014 and 03/07/2014

CLINICAL DATA: Abnormal CT, evaluate for esophageal perforation

EXAM:
ESOPHOGRAM/BARIUM SWALLOW
TECHNIQUE: Single contrast examination was performed using water-soluble
contrast.
FLUOROSCOPY TIME:  1 min 7 seconds

[Series 1: run · 1 of 1 slices shown (1 of 14)]
[im 1/1]
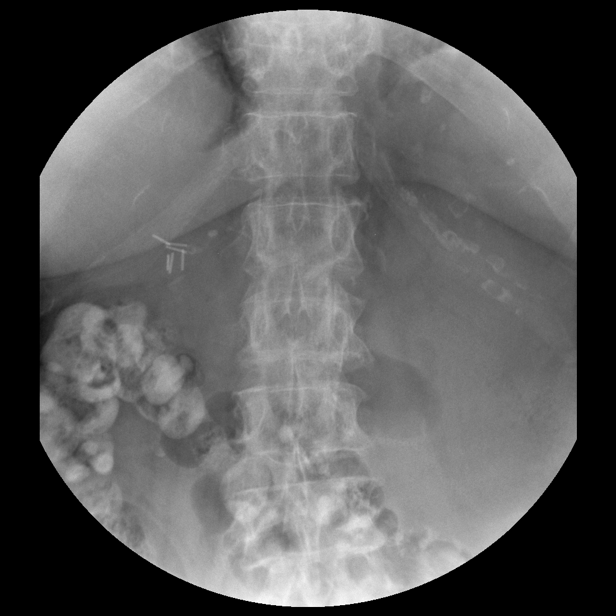

[Series 2: run · 1 of 1 slices shown (2 of 14)]
[im 1/1]
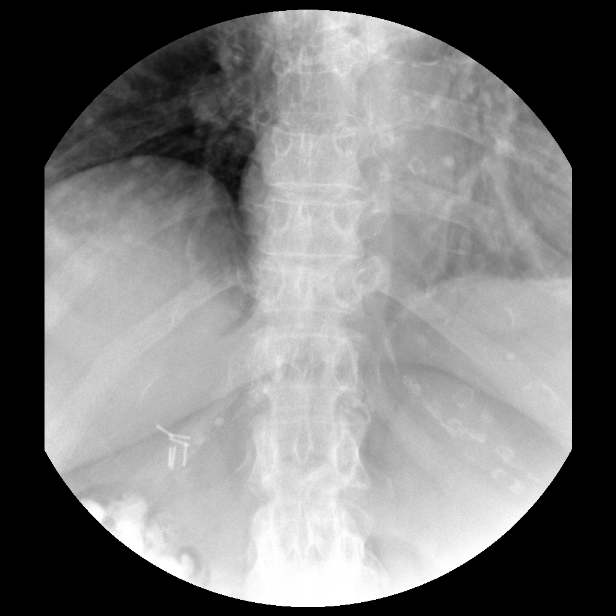

[Series 3: run · 1 of 1 slices shown (3 of 14)]
[im 1/1]
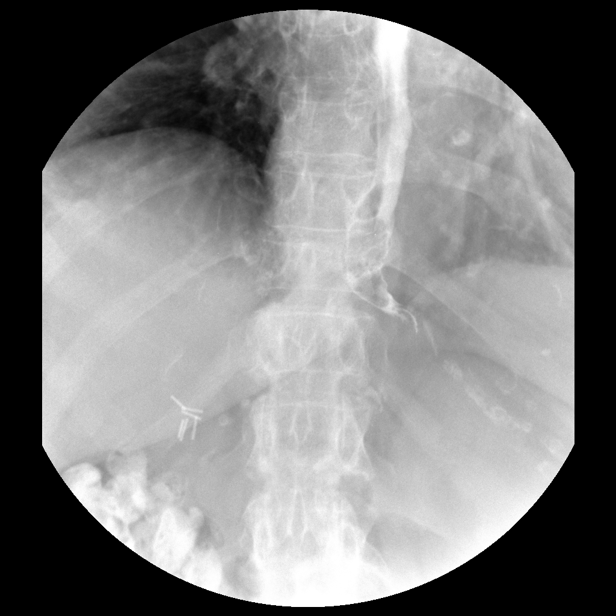

[Series 4: run · 1 of 1 slices shown (4 of 14)]
[im 1/1]
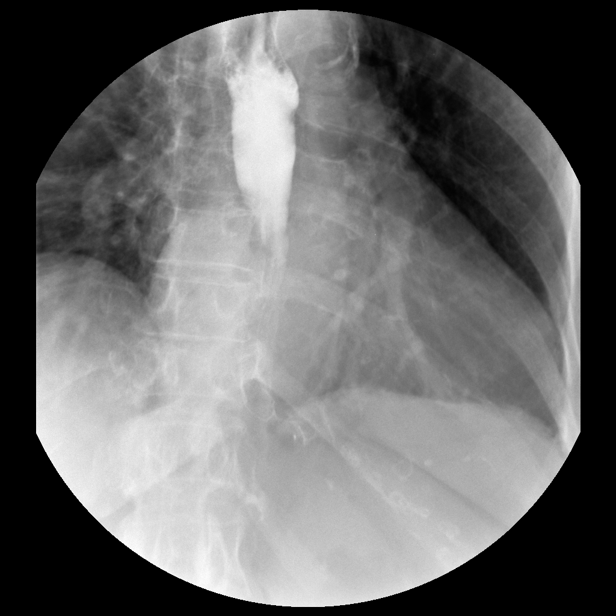

[Series 5: run · 1 of 1 slices shown (5 of 14)]
[im 1/1]
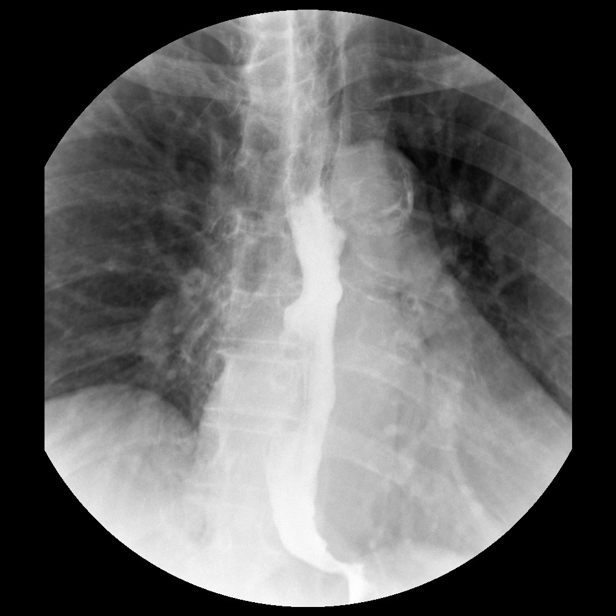

[Series 6: run · 1 of 1 slices shown (6 of 14)]
[im 1/1]
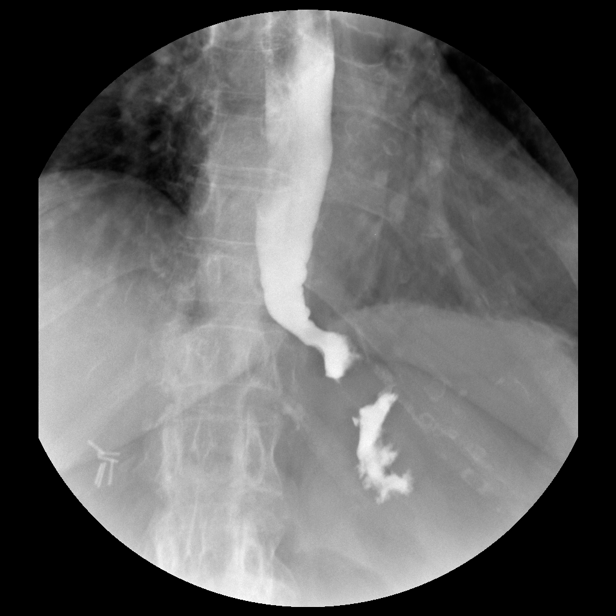

[Series 7: run · 1 of 1 slices shown (7 of 14)]
[im 1/1]
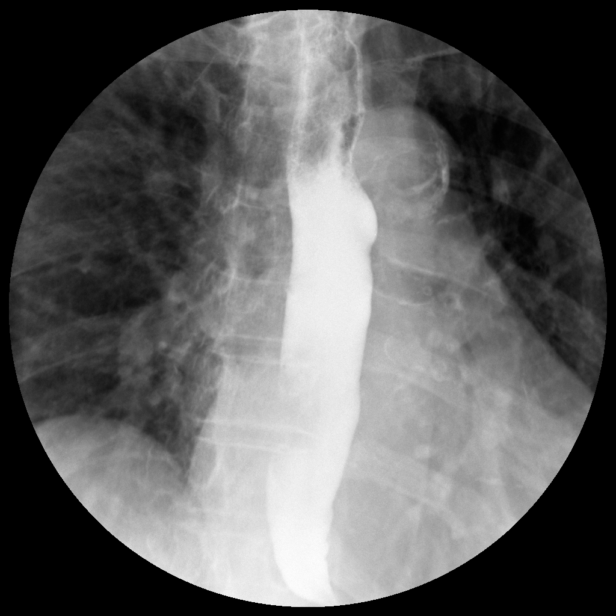

[Series 8: run · 1 of 1 slices shown (8 of 14)]
[im 1/1]
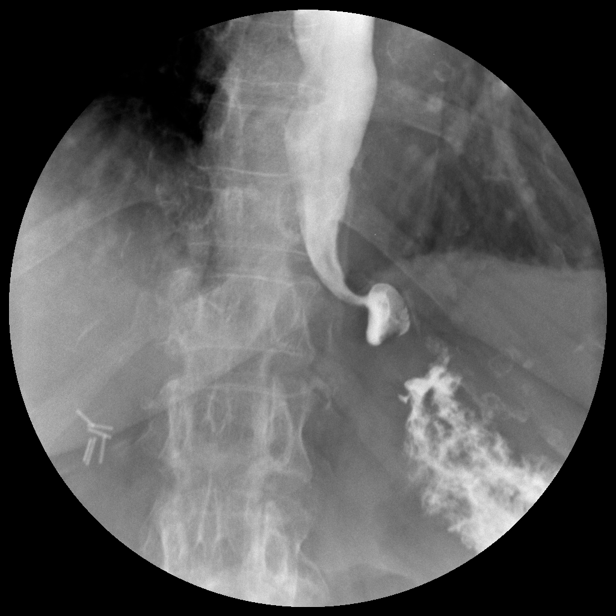

[Series 9: run · 1 of 1 slices shown (9 of 14)]
[im 1/1]
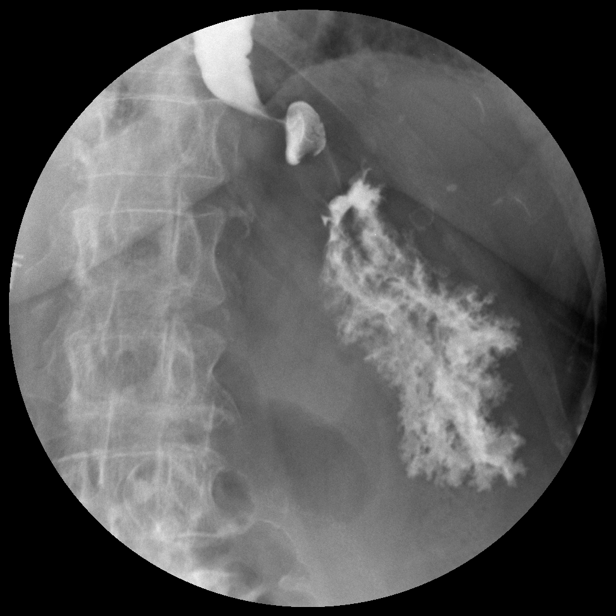

[Series 10: run · 1 of 1 slices shown (10 of 14)]
[im 1/1]
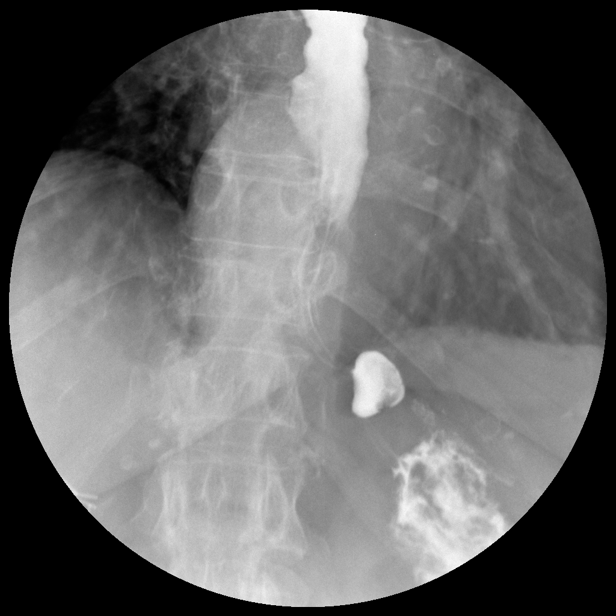

[Series 11: run · 1 of 1 slices shown (11 of 14)]
[im 1/1]
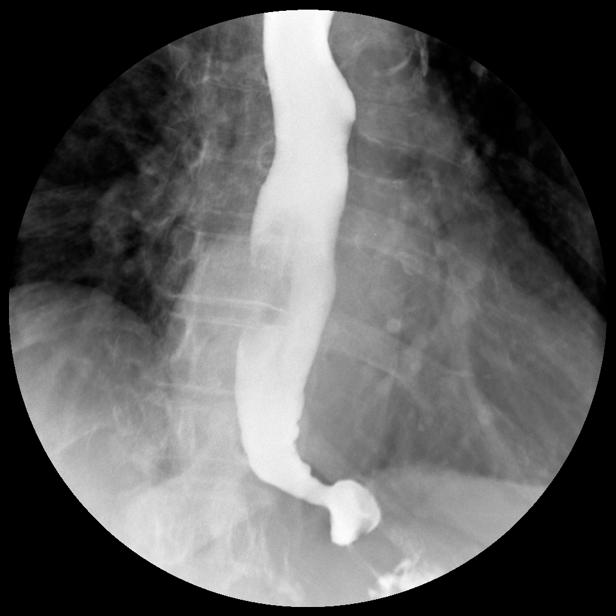

[Series 12: run · 1 of 1 slices shown (12 of 14)]
[im 1/1]
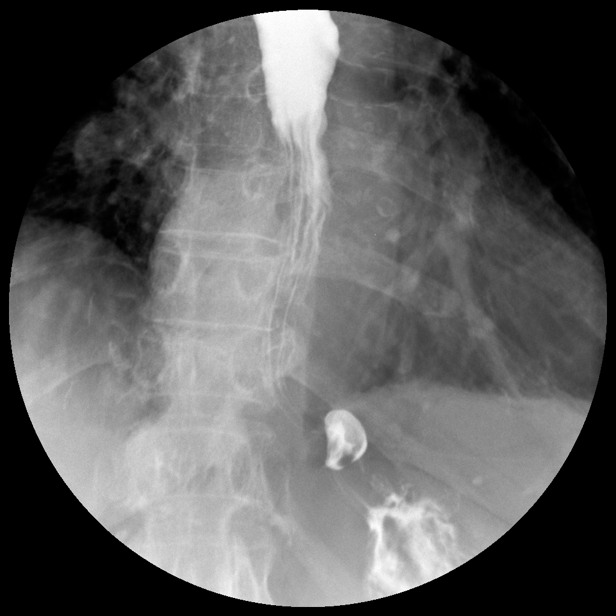

[Series 13: run · 1 of 1 slices shown (13 of 14)]
[im 1/1]
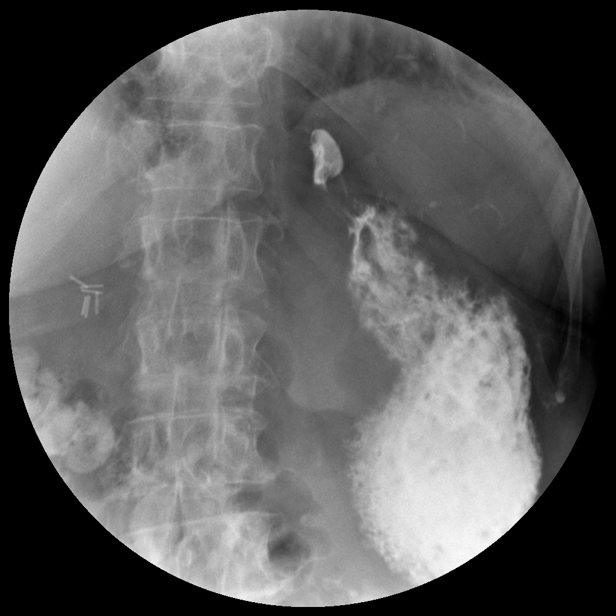

[Series 14: run · 1 of 1 slices shown (14 of 14)]
[im 1/1]
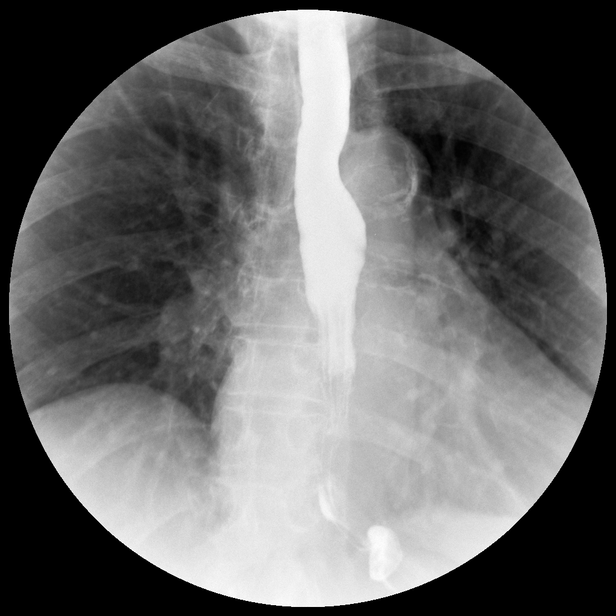

[14 of 14 positions shown; findings below may reference images not displayed]

FINDINGS: Esophageal dysmotility.

No fixed esophageal narrowing/stricture.

Suspected small paraesophageal hernia (less likely hiatal hernia or
esophageal diverticulum) at the GE junction. The appearance does not
suggest a contained esophageal perforation.

Presence of gastroesophageal reflux could not be assessed due to the
patient's inability to clear her esophagus.

Moderate food/debris in the stomach.
IMPRESSION: Esophageal dysmotility.  No fixed esophageal narrowing/stricture.

Suspected small paraesophageal hernia at the GE junction. No
evidence of contained esophageal perforation.

Moderate food/debris in the stomach.

## 2016-08-13 MED ORDER — STARCH (THICKENING) PO POWD
Freq: Once | ORAL | Status: AC
  Administered 2016-08-13: 15:00:00 via ORAL
  Filled 2016-08-13 (×2): qty 227

## 2016-08-13 MED ORDER — GUAIFENESIN ER 600 MG PO TB12
1200.0000 mg | ORAL_TABLET | Freq: Two times a day (BID) | ORAL | Status: DC
  Administered 2016-08-13 – 2016-08-18 (×11): 1200 mg via ORAL
  Filled 2016-08-13 (×11): qty 2

## 2016-08-13 MED ORDER — GABAPENTIN 300 MG PO CAPS
300.0000 mg | ORAL_CAPSULE | Freq: Once | ORAL | Status: AC
  Administered 2016-08-13: 300 mg via ORAL
  Filled 2016-08-13: qty 1

## 2016-08-13 MED ORDER — DEXTROSE 5 % IV SOLN
500.0000 mg | INTRAVENOUS | Status: DC
  Administered 2016-08-13 – 2016-08-16 (×4): 500 mg via INTRAVENOUS
  Filled 2016-08-13 (×4): qty 500

## 2016-08-13 NOTE — Evaluation (Signed)
Physical Therapy Evaluation Patient Details Name: Shannon [name] MRN: [id number] DOB: 30-Jun-1923 Today's Date: 08/13/2016   History of Present Illness  83 yo female with PMH of hypothyroidism, symptomatic bradycardia (she declined PPM), CKD 3, GERD, COPD, and DVT with recent admission  for evaluation of progressive diabetic foot ulcer and gangrene of her second digit on the left toe. Pt now admitted with DOE, cough, wheezing.   Clinical Impression  Pt admitted with above diagnosis. Pt currently with functional limitations due to the deficits listed below (see PT Problem List). Pt ambulated 4' with RW, distance limited by coughing/dyspnea, SaO2 97% on RA with activity. HHPT recommended. Pt has 24* assistance at home.  Pt will benefit from skilled PT to increase their independence and safety with mobility to allow discharge to the venue listed below.       Follow Up Recommendations Home health PT    Equipment Recommendations  None recommended by PT    Recommendations for Other Services       Precautions / Restrictions Precautions Precautions: Fall Restrictions Weight Bearing Restrictions: No      Mobility  Bed Mobility Overal bed mobility: Modified Independent             General bed mobility comments: with rail  Transfers Overall transfer level: Needs assistance Equipment used: Rolling walker (2 wheeled) Transfers: Sit to/from Stand Sit to Stand: Min guard         General transfer comment: Minguard for safety; cues for hand placement; very dependent on UEs to push and for balance during transfer  Ambulation/Gait Ambulation/Gait assistance: Min guard Ambulation Distance (Feet): 4 Feet Assistive device: Rolling walker (2 wheeled) Gait Pattern/deviations: Step-through pattern;Decreased step length - right;Decreased step length - left;Decreased stride length;Trunk flexed   Gait velocity interpretation: Below normal speed for age/gender General Gait Details: distance  limited by coughing/fatigue, SaO2 97% on RA, no LOB  Stairs            Wheelchair Mobility    Modified Rankin (Stroke Patients Only)       Balance Overall balance assessment: Needs assistance   Sitting balance-Leahy Scale: Good       Standing balance-Leahy Scale: Fair                               Pertinent Vitals/Pain Faces Pain Scale: Hurts little more Pain Location: L heel Pain Descriptors / Indicators: Aching;Grimacing Pain Intervention(s): Limited activity within patient's tolerance;Monitored during session    Home Living Family/patient expects to be discharged to:: Private residence Living Arrangements: Children (with daughter) Available Help at Discharge: Family;Available 24 hours/day Type of Home: House Home Access: Ramped entrance     Home Layout: One level Home Equipment: Walker - 4 wheels;Cane - single point;Hand held Careers information officer - 2 wheels;Bedside commode Additional Comments: Aide comes to assist granddaughter with ADL's    Prior Function Level of Independence: Needs assistance   Gait / Transfers Assistance Needed: Uses rollator   ADL's / Homemaking Assistance Needed: Granddaughter assists with shower. Daughter assists with feeding tube, and does the housekeeping.        Hand Dominance   Dominant Hand: Right    Extremity/Trunk Assessment   Upper Extremity Assessment Upper Extremity Assessment: Overall WFL for tasks assessed    Lower Extremity Assessment Lower Extremity Assessment: Overall WFL for tasks assessed (R knee ext 5/5, L knee ext 4/5)    Cervical / Trunk Assessment Cervical / Trunk  Assessment: Kyphotic  Communication   Communication: No difficulties  Cognition Arousal/Alertness: Awake/alert Behavior During Therapy: WFL for tasks assessed/performed Overall Cognitive Status: Within Functional Limits for tasks assessed                                        General Comments       Exercises     Assessment/Plan    PT Assessment Patient needs continued PT services  PT Problem List Decreased strength;Decreased activity tolerance;Decreased balance;Decreased mobility;Pain       PT Treatment Interventions DME instruction;Gait training;Stair training;Functional mobility training;Therapeutic activities;Therapeutic exercise;Balance training;Neuromuscular re-education;Cognitive remediation;Patient/family education    PT Goals (Current goals can be found in the Care Plan section)  Acute Rehab PT Goals Patient Stated Goal: Hopes to be home soon PT Goal Formulation: With patient Time For Goal Achievement: 08/27/16 Potential to Achieve Goals: Good    Frequency Min 3X/week   Barriers to discharge        Co-evaluation               AM-PAC PT "6 Clicks" Daily Activity  Outcome Measure Difficulty turning over in bed (including adjusting bedclothes, sheets and blankets)?: None Difficulty moving from lying on back to sitting on the side of the bed? : None Difficulty sitting down on and standing up from a chair with arms (e.g., wheelchair, bedside commode, etc,.)?: A Little Help needed moving to and from a bed to chair (including a wheelchair)?: A Little Help needed walking in hospital room?: A Little Help needed climbing 3-5 steps with a railing? : A Lot 6 Click Score: 19    End of Session Equipment Utilized During Treatment: Gait belt Activity Tolerance: Patient tolerated treatment well Patient left: in chair;with call bell/phone within reach;with chair alarm set Nurse Communication: Mobility status PT Visit Diagnosis: Other abnormalities of gait and mobility (R26.89);Pain Pain - Right/Left: Left Pain - part of body: Ankle and joints of foot (foot and toes)    Time: 1610-9604 PT Time Calculation (min) (ACUTE ONLY): 13 min   Charges:   PT Evaluation $PT Eval Low Complexity: 1 Procedure     PT G Codes:          Shannon Bathe  [name] 08/13/2016, 10:00 AM [phone]

## 2016-08-13 NOTE — Progress Notes (Signed)
PROGRESS NOTE    Shannon [name]  BMW:[id number] DOB: Aug 07, 1923 DOA: 08/12/2016 PCP: Kirt Boys, DO  Brief Narrative:  Shannon [name] is a 83 y.o. female with known COPD, CAD, HTN, CKD stage III, dementia, presented to [facility] ED with main concern of several days duration of progressively worsening dyspnea, initially present with exertion but has progressed to dyspnea at rest for the past 24 hours, associated with wheezing, non productive cough, chest tightness that occurs with coughing spells, poor oral intake., Please note pt is unable to provide much of the history due to dementia and family at bedside able to provide some information. In the ED the paient initially had significant dyspnea but improved with nebulizer treatments and solumedrol. Currently VSS, TRH asked to admit for further evaluation for COPD Exacerbation.  Assessment & Plan:   Active Problems:   Essential hypertension, benign   Dyslipidemia   Diabetes mellitus type 2 in nonobese (HCC)   Hypothyroidism   Depression   Chronic kidney insufficiency, stage 3 (moderate)   SOB (shortness of breath)   PEG (percutaneous endoscopic gastrostomy) status (HCC)   COPD (chronic obstructive pulmonary disease) (HCC)  Acute Exacerbation of COPD (chronic obstructive pulmonary disease) (HCC) with Hx of Asthma -Admitted to Telemetry -Placed on Bronchodilators with DuoNebs 3 mL TID scheduled and with Albuterol 2.4 mg Nebs q2hprn for Wheezing -Started IV Azithromycin for COPD -C/w Fluticasone Nasal Spray 2 spray each Nare Daily -Added Guaifenesin 1200 mg po BID -Checked Respiratory Virus Panel and was Negative; Droplet Precautions Discontinued -Placed on Solumedrol 40 mg IV q6h with planned tapering as clinically indicated -C/w Supplemental O2 -Repeat CXR in AM -WBC went from 8.5 -> 11.0 likely from Steroid Demargination  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 -Held lantus until oral intake improves -Placed on  Sensitive Novolog SSI for now -CBG's ranged from 178-228  HTN -BP Well controlled - Continue home medical regimen with Amlodipine 10 mg po Daily, Furosemide 20 mg Daily, Imdur 30 mg po Daily, and with Losartan 50 mg po Daily -Has Hydralazine 5 mg IV q4hprn for SBP >155 when needed  CKD stage III -Cr is at baseline -BUN/Cr went from 62/1.03 -> 63/1.16 -Repeat CMP in AM  Gastric Stenosis/Outlet Obstruction s/p PEG -Full Liquid Diet during the Day; C/w Thick It  -SLP to Evaluate -Gets Tube Feedings at Night and Dietician Consulted for help in management of Nightly Tube Feedings   Hx of DVT - Continue Coumadin per Pharmacy Dosing (INR was 1.84) -Repeat PT-INR in AM  Chronic Diastolic CHF -Appears to be Compensated  - continue Lasix per home medical regimen   Depression -C/w Sertraline 50 mg po Daily  Hyperlipidemia -C/w Atorvastatin 10 mg po Daily  Hypothyroidism -C/w Levothyroxine 137 mcg po Daily  Left Foot Second Toe Necrosis -WOC Nurse Consultation appreciated   DVT prophylaxis: Anticoagulated with Coumadin  Code Status: DO NOT RESUSCITATE Family Communication: No Family present at bedside Disposition Plan: Home Health PT when medically Stable.  Consultants:   None   Procedures: None   Antimicrobials:  Anti-infectives    Start     Dose/Rate Route Frequency Ordered Stop   08/13/16 1000  azithromycin (ZITHROMAX) 500 mg in dextrose 5 % 250 mL IVPB     500 mg 250 mL/hr over 60 Minutes Intravenous Every 24 hours 08/13/16 0943       Subjective: Seen and examined at bedside and was still coughing. No nausea or vomiting. Patient feeling slightly better but SOB when ambulating.  Objective: Vitals:   08/13/16 0603 08/13/16 0955 08/13/16 1318 08/13/16 1446  BP: (!) 160/73   121/72  Pulse: (!) 107   (!) 105  Resp: 20   20  Temp: 99 F (37.2 C)   99.4 F (37.4 C)  TempSrc: Oral   Oral  SpO2: 98% 97% 99% 96%  Weight:      Height:        Intake/Output  Summary (Last 24 hours) at 08/13/16 1905 Last data filed at 08/13/16 1800  Gross per 24 hour  Intake             1020 ml  Output             1275 ml  Net             -255 ml   Filed Weights   08/12/16 1653  Weight: 68.3 kg (150 lb 9.2 oz)   Examination: Physical Exam:  Constitutional:  NAD and appears calm and comfortable Eyes: Llids and conjunctivae normal, sclerae anicteric  ENMT: External Ears, Nose appear normal. Grossly normal hearing.  Neck: Appears normal, supple, no cervical masses, normal ROM, no appreciable thyromegaly, no JVD Respiratory: Diminished to auscultation bilaterally with wheezing and crackle. Normal respiratory effort and patient is not tachypenic. No accessory muscle use.  Cardiovascular: Tachycardic Rate and Rhythm, no murmurs / rubs / gallops. S1 and S2 auscultated. No extremity edema.   Abdomen: Soft, non-tender, non-distended. PEG tube in place. No masses palpated. No appreciable hepatosplenomegaly. Bowel sounds positive.  GU: Deferred. Musculoskeletal: No clubbing / cyanosis of digits/nails. No joint deformity upper and lower extremities.  Skin: Left toe ulcer. No induration; Warm and dry.  Neurologic: CN 2-12 grossly intact with no focal deficits. Romberg sign cerebellar reflexes not assessed.  Psychiatric: Normal judgment and insight. Alert and oriented x 3. Normal and pleasant mood and appropriate affect.   Data Reviewed: I have personally reviewed following labs and imaging studies  CBC:  Recent Labs Lab 08/07/16 1424 08/12/16 1400 08/13/16 0520  WBC 8.3 8.5 11.0*  NEUTROABS 4,648  --   --   HGB 8.6* 8.5* 8.7*  HCT 27.3* 25.7* 26.3*  MCV 90.4 88.0 88.3  PLT 495* 369 373   Basic Metabolic Panel:  Recent Labs Lab 08/07/16 1424 08/12/16 1400 08/13/16 0520  NA 133* 133* 136  K 5.3 4.5 4.7  CL 100 98* 100*  CO2 25 26 25   GLUCOSE 170* 186* 221*  BUN 58* 62* 63*  CREATININE 1.20* 1.03* 1.16*  CALCIUM 9.2 9.3 10.0   GFR: Estimated  Creatinine Clearance: 29.6 mL/min (A) (by C-G formula based on SCr of 1.16 mg/dL (H)). Liver Function Tests: No results for input(s): AST, ALT, ALKPHOS, BILITOT, PROT, ALBUMIN in the last 168 hours. No results for input(s): LIPASE, AMYLASE in the last 168 hours. No results for input(s): AMMONIA in the last 168 hours. Coagulation Profile:  Recent Labs Lab 08/11/16 1433 08/12/16 1429 08/13/16 0520  INR 2.4 1.98 1.84   Cardiac Enzymes: No results for input(s): CKTOTAL, CKMB, CKMBINDEX, TROPONINI in the last 168 hours. BNP (last 3 results)  Recent Labs  05/08/16 1053  PROBNP 1,132*   HbA1C: No results for input(s): HGBA1C in the last 72 hours. CBG:  Recent Labs Lab 08/13/16 0843 08/13/16 1214 08/13/16 1634  GLUCAP 178* 222* 228*   Lipid Profile: No results for input(s): CHOL, HDL, LDLCALC, TRIG, CHOLHDL, LDLDIRECT in the last 72 hours. Thyroid Function Tests: No results for input(s): TSH, T4TOTAL, FREET4,  T3FREE, THYROIDAB in the last 72 hours. Anemia Panel: No results for input(s): VITAMINB12, FOLATE, FERRITIN, TIBC, IRON, RETICCTPCT in the last 72 hours. Sepsis Labs: No results for input(s): PROCALCITON, LATICACIDVEN in the last 168 hours.  Recent Results (from the past 240 hour(s))  Respiratory Panel by PCR     Status: None   Collection Time: 08/13/16 11:37 AM  Result Value Ref Range Status   Adenovirus NOT DETECTED NOT DETECTED Final   Coronavirus 229E NOT DETECTED NOT DETECTED Final   Coronavirus HKU1 NOT DETECTED NOT DETECTED Final   Coronavirus NL63 NOT DETECTED NOT DETECTED Final   Coronavirus OC43 NOT DETECTED NOT DETECTED Final   Metapneumovirus NOT DETECTED NOT DETECTED Final   Rhinovirus / Enterovirus NOT DETECTED NOT DETECTED Final   Influenza A NOT DETECTED NOT DETECTED Final   Influenza B NOT DETECTED NOT DETECTED Final   Parainfluenza Virus 1 NOT DETECTED NOT DETECTED Final   Parainfluenza Virus 2 NOT DETECTED NOT DETECTED Final   Parainfluenza  Virus 3 NOT DETECTED NOT DETECTED Final   Parainfluenza Virus 4 NOT DETECTED NOT DETECTED Final   Respiratory Syncytial Virus NOT DETECTED NOT DETECTED Final   Bordetella pertussis NOT DETECTED NOT DETECTED Final   Chlamydophila pneumoniae NOT DETECTED NOT DETECTED Final   Mycoplasma pneumoniae NOT DETECTED NOT DETECTED Final    Comment: Performed at [facility], [address], [place], [place] [address]    Radiology Studies: Dg Chest 2 View  Result Date: 08/12/2016 CLINICAL DATA:  Shortness of breath and cough today. EXAM: CHEST  2 VIEW COMPARISON:  06/18/2016 and prior chest radiograph FINDINGS: Cardiomegaly and mild pulmonary vascular congestion noted. Elevation of the right hemidiaphragm is again identified. There is no evidence of focal airspace disease, pulmonary edema, suspicious pulmonary nodule/mass, pleural effusion, or pneumothorax. No acute bony abnormalities are identified. IMPRESSION: Cardiomegaly with mild pulmonary vascular congestion. Electronically Signed   By: Harmon Pier M.D.   On: 08/12/2016 14:57   Scheduled Meds: . amLODipine  10 mg Oral Daily  . atorvastatin  10 mg Oral Daily  . feeding supplement (ENSURE ENLIVE)  237 mL Oral BID BM  . fluticasone  2 spray Each Nare Daily  . furosemide  20 mg Oral Daily  . guaiFENesin  1,200 mg Oral BID  . insulin aspart  0-9 Units Subcutaneous TID WC  . ipratropium-albuterol  3 mL Nebulization TID  . isosorbide mononitrate  30 mg Oral Daily  . latanoprost  1 drop Both Eyes QHS  . levothyroxine  137 mcg Oral QAC breakfast  . losartan  50 mg Oral Daily  . methylPREDNISolone (SOLU-MEDROL) injection  40 mg Intravenous Q6H  . sertraline  50 mg Oral Daily  . sodium chloride flush  3 mL Intravenous Q12H  . Warfarin - Pharmacist Dosing Inpatient   Does not apply q1800   Continuous Infusions: . azithromycin Stopped (08/13/16 1307)    LOS: 1 day   Merlene Laughter, DO [facility] Pager [phone]  If 7PM-7AM,  please contact night-coverage [email or web address] Password [email or web address] 08/13/2016, 7:05 PM

## 2016-08-14 ENCOUNTER — Inpatient Hospital Stay (HOSPITAL_COMMUNITY): Payer: Medicare Other

## 2016-08-14 LAB — CBC WITH DIFFERENTIAL/PLATELET
Basophils Absolute: 0 10*3/uL (ref 0.0–0.1)
Basophils Relative: 0 %
Eosinophils Absolute: 0 10*3/uL (ref 0.0–0.7)
Eosinophils Relative: 0 %
HCT: 27 % — ABNORMAL LOW (ref 36.0–46.0)
Hemoglobin: 9 g/dL — ABNORMAL LOW (ref 12.0–15.0)
Lymphocytes Relative: 3 %
Lymphs Abs: 0.5 10*3/uL — ABNORMAL LOW (ref 0.7–4.0)
MCH: 29.1 pg (ref 26.0–34.0)
MCHC: 33.3 g/dL (ref 30.0–36.0)
MCV: 87.4 fL (ref 78.0–100.0)
Monocytes Absolute: 0.3 10*3/uL (ref 0.1–1.0)
Monocytes Relative: 2 %
Neutro Abs: 15.8 10*3/uL — ABNORMAL HIGH (ref 1.7–7.7)
Neutrophils Relative %: 95 %
Platelets: 371 10*3/uL (ref 150–400)
RBC: 3.09 MIL/uL — ABNORMAL LOW (ref 3.87–5.11)
RDW: 18 % — ABNORMAL HIGH (ref 11.5–15.5)
WBC: 16.6 10*3/uL — ABNORMAL HIGH (ref 4.0–10.5)

## 2016-08-14 LAB — GLUCOSE, CAPILLARY
Glucose-Capillary: 217 mg/dL — ABNORMAL HIGH (ref 65–99)
Glucose-Capillary: 285 mg/dL — ABNORMAL HIGH (ref 65–99)
Glucose-Capillary: 276 mg/dL — ABNORMAL HIGH (ref 65–99)
Glucose-Capillary: 309 mg/dL — ABNORMAL HIGH (ref 65–99)

## 2016-08-14 LAB — COMPREHENSIVE METABOLIC PANEL
ALT: 33 U/L (ref 14–54)
AST: 59 U/L — ABNORMAL HIGH (ref 15–41)
Albumin: 3.2 g/dL — ABNORMAL LOW (ref 3.5–5.0)
Alkaline Phosphatase: 59 U/L (ref 38–126)
Anion gap: 12 (ref 5–15)
BUN: 55 mg/dL — ABNORMAL HIGH (ref 6–20)
CO2: 25 mmol/L (ref 22–32)
Calcium: 9.8 mg/dL (ref 8.9–10.3)
Chloride: 101 mmol/L (ref 101–111)
Creatinine, Ser: 1.07 mg/dL — ABNORMAL HIGH (ref 0.44–1.00)
GFR calc Af Amer: 51 mL/min — ABNORMAL LOW (ref >60)
GFR calc non Af Amer: 44 mL/min — ABNORMAL LOW (ref >60)
Glucose, Bld: 238 mg/dL — ABNORMAL HIGH (ref 65–99)
Potassium: 3.8 mmol/L (ref 3.5–5.1)
Sodium: 138 mmol/L (ref 135–145)
Total Bilirubin: 0.6 mg/dL (ref 0.3–1.2)
Total Protein: 8.1 g/dL (ref 6.5–8.1)

## 2016-08-14 LAB — MAGNESIUM: Magnesium: 2.1 mg/dL (ref 1.7–2.4)

## 2016-08-14 LAB — PROTIME-INR
INR: 1.64
Prothrombin Time: 19.6 seconds — ABNORMAL HIGH (ref 11.4–15.2)

## 2016-08-14 LAB — PHOSPHORUS: Phosphorus: 3.7 mg/dL (ref 2.5–4.6)

## 2016-08-14 IMAGING — DX DG CHEST 1V PORT
1 series · 1 of 1 positions shown · non-contrast
Comparison: None.

CLINICAL DATA: 89-year-old female with new onset chest pain
beginning today

EXAM:
PORTABLE CHEST - 1 VIEW

[chest ap]
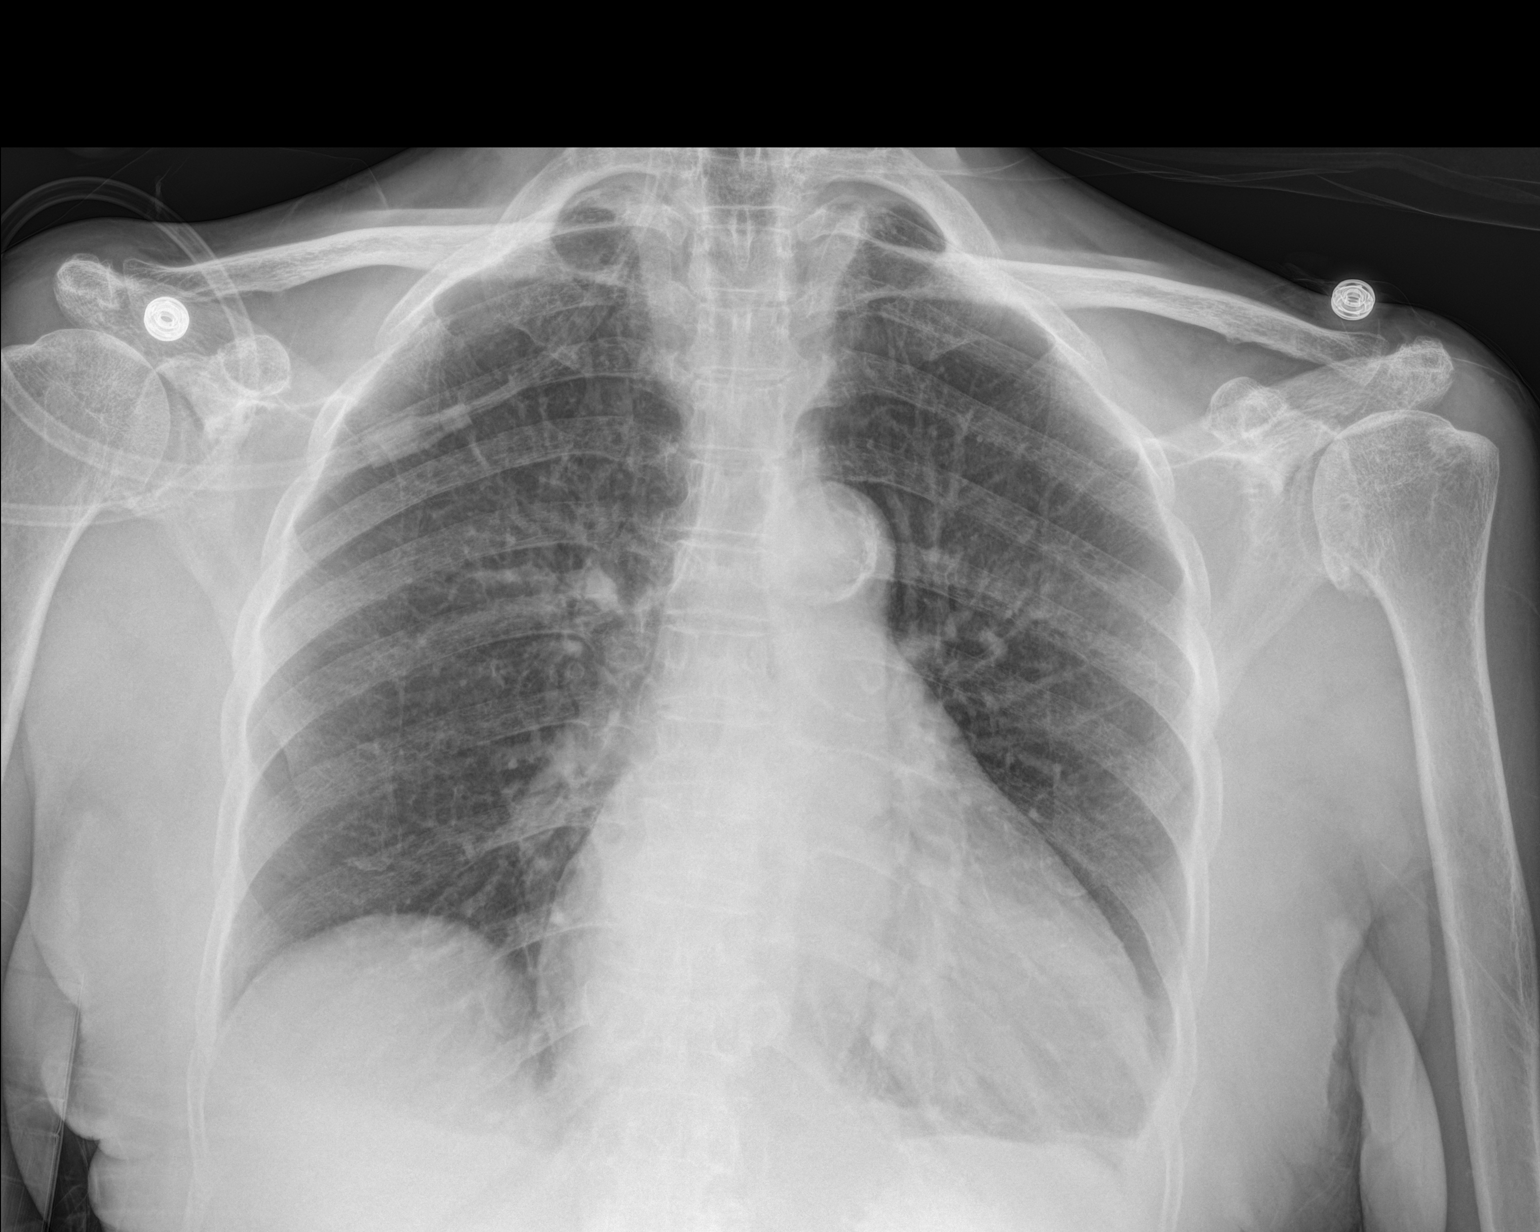

[1 of 1 positions shown; findings below may reference images not displayed]

FINDINGS: Cardiomegaly. Atherosclerotic calcification present in the
transverse aorta. No focal airspace consolidation, pulmonary edema,
pneumothorax. a query trace left pleural effusion. No acute osseous
abnormality. The left glenohumeral joint osteoarthritis. Mild
coarsening of the interstitial markings.
IMPRESSION: 1. Query trace left pleural effusion.
2. Cardiomegaly.
3. Aortic atherosclerosis.

## 2016-08-14 MED ORDER — JEVITY 1.2 CAL PO LIQD: 1000.0000 mL | ORAL | Status: DC

## 2016-08-14 MED ORDER — PRO-STAT SUGAR FREE PO LIQD
30.0000 mL | Freq: Two times a day (BID) | ORAL | Status: DC
  Administered 2016-08-14 – 2016-08-16 (×5): 30 mL
  Filled 2016-08-14 (×5): qty 30

## 2016-08-14 MED ORDER — GABAPENTIN 300 MG PO CAPS
300.0000 mg | ORAL_CAPSULE | ORAL | Status: DC
  Administered 2016-08-14 – 2016-08-17 (×7): 300 mg via ORAL
  Filled 2016-08-14 (×6): qty 1

## 2016-08-14 MED ORDER — CLINDAMYCIN HCL 300 MG PO CAPS
300.0000 mg | ORAL_CAPSULE | Freq: Three times a day (TID) | ORAL | Status: DC
  Administered 2016-08-14 – 2016-08-16 (×7): 300 mg via ORAL
  Filled 2016-08-14 (×7): qty 1

## 2016-08-14 MED ORDER — POLYVINYL ALCOHOL 1.4 % OP SOLN
1.0000 [drp] | Freq: Four times a day (QID) | OPHTHALMIC | Status: DC
  Administered 2016-08-14 – 2016-08-18 (×16): 1 [drp] via OPHTHALMIC
  Filled 2016-08-14: qty 15

## 2016-08-14 MED ORDER — OSMOLITE 1.5 CAL PO LIQD
1000.0000 mL | ORAL | Status: DC
  Administered 2016-08-14 – 2016-08-15 (×2): 1000 mL
  Filled 2016-08-14 (×2): qty 1000

## 2016-08-14 MED ORDER — NEPRO/CARBSTEADY PO LIQD
237.0000 mL | ORAL | Status: DC
  Administered 2016-08-14 – 2016-08-15 (×2): 237 mL via ORAL
  Filled 2016-08-14 (×3): qty 237

## 2016-08-14 MED ORDER — WARFARIN SODIUM 4 MG PO TABS
4.0000 mg | ORAL_TABLET | Freq: Once | ORAL | Status: AC
  Administered 2016-08-14: 4 mg via ORAL
  Filled 2016-08-14: qty 1

## 2016-08-14 MED ORDER — GABAPENTIN 300 MG PO CAPS
600.0000 mg | ORAL_CAPSULE | Freq: Every day | ORAL | Status: DC
  Administered 2016-08-15 – 2016-08-17 (×3): 600 mg via ORAL
  Filled 2016-08-14 (×4): qty 2

## 2016-08-14 MED ORDER — POLYETHYL GLYCOL-PROPYL GLYCOL 0.4-0.3 % OP SOLN: 1.0000 [drp] | Freq: Four times a day (QID) | OPHTHALMIC | Status: DC

## 2016-08-14 MED ORDER — CYCLOSPORINE 0.05 % OP EMUL
1.0000 [drp] | Freq: Two times a day (BID) | OPHTHALMIC | Status: DC
  Administered 2016-08-14 – 2016-08-18 (×8): 1 [drp] via OPHTHALMIC
  Filled 2016-08-14 (×9): qty 1

## 2016-08-14 NOTE — Evaluation (Addendum)
Clinical/Bedside Swallow Evaluation Patient Details  Name: Shannon [name] MRN: [id number] Date of Birth: 1923-09-21  Today's Date: 08/14/2016 Time: SLP Start Time (ACUTE ONLY): 8315 SLP Stop Time (ACUTE ONLY): 0905 SLP Time Calculation (min) (ACUTE ONLY): 30 min  Past Medical History:  Past Medical History:  Diagnosis Date  . Anemia    previous blood transfusions  . Arthritis    "all over"  . Asthma   . Bradycardia    requiring previous d/c of BB and reduction of amiodarone  . CAD (coronary artery disease)    nonobstructive per notes  . Chronic diastolic CHF (congestive heart failure) (HCC)   . CKD (chronic kidney disease), stage III   . Complication of blood transfusion    "got the wrong blood type at [facility] in ~ 2015; no adverse reaction that we are aware of"/daughter, Shannon [name] (01/27/2016)  . COPD (chronic obstructive pulmonary disease) (HCC)   . Depression    "light case"  . DVT (deep venous thrombosis) (HCC) 01/2016   a. LLE DVT 01/2016 - switched from Eliquis to Coumadin.  . Gastric stenosis    a. s/p stomach tube  . GERD (gastroesophageal reflux disease)   . History of blood transfusion    "several" (01/27/2016)  . History of stomach ulcers   . Hyperlipidemia   . Hypertension   . Hypothyroidism   . Paraesophageal hernia   . Perforated gastric ulcer (HCC)   . Seasonal allergies   . SIADH (syndrome of inappropriate ADH production) (HCC)    Shannon [name] 01/10/2015  . Small bowel obstruction (HCC)    "I don't know how many" (01/11/2015)  . Stroke (HCC)    "light one"  . Type II diabetes mellitus (HCC)    "related to prednisone use  for > 20 yr; once predinose stopped; no more DM RX" (01/27/2016)  . Ventral hernia with bowel obstruction    Past Surgical History:  Past Surgical History:  Procedure Laterality Date  . CATARACT EXTRACTION W/ INTRAOCULAR LENS  IMPLANT, BILATERAL    . CHOLECYSTECTOMY OPEN    . COLECTOMY    . ESOPHAGOGASTRODUODENOSCOPY N/A 01/19/2014   Procedure: ESOPHAGOGASTRODUODENOSCOPY (EGD);  Surgeon: Hilarie Fredrickson, MD;  Location: [facility];  Service: Endoscopy;  Laterality: N/A;  . ESOPHAGOGASTRODUODENOSCOPY N/A 01/20/2014   Procedure: ESOPHAGOGASTRODUODENOSCOPY (EGD);  Surgeon: Hilarie Fredrickson, MD;  Location: [facility];  Service: Endoscopy;  Laterality: N/A;  . ESOPHAGOGASTRODUODENOSCOPY N/A 03/19/2014   Procedure: ESOPHAGOGASTRODUODENOSCOPY (EGD);  Surgeon: Rachael Fee, MD;  Location: [facility];  Service: Endoscopy;  Laterality: N/A;  . ESOPHAGOGASTRODUODENOSCOPY N/A 07/08/2015   Procedure: ESOPHAGOGASTRODUODENOSCOPY (EGD);  Surgeon: Sherrilyn Rist, MD;  Location: [facility] ENDOSCOPY;  Service: Endoscopy;  Laterality: N/A;  . ESOPHAGOGASTRODUODENOSCOPY (EGD) WITH PROPOFOL N/A 09/15/2015   Procedure: ESOPHAGOGASTRODUODENOSCOPY (EGD) WITH PROPOFOL;  Surgeon: Ruffin Frederick, MD;  Location: [facility] ENDOSCOPY;  Service: Gastroenterology;  Laterality: N/A;  . GASTROJEJUNOSTOMY     hx/notes 01/10/2015  . GASTROJEJUNOSTOMY N/A 09/23/2015   Procedure: OPEN GASTROJEJUNOSTOMY TUBE PLACEMENT;  Surgeon: De Blanch Kinsinger, MD;  Location: [facility] ORS;  Service: General;  Laterality: N/A;  . GLAUCOMA SURGERY Bilateral   . HERNIA REPAIR  2015  . IR GENERIC HISTORICAL  01/07/2016   IR GJ TUBE CHANGE 01/07/2016 Malachy Moan, MD [facility]  . IR GENERIC HISTORICAL  01/27/2016   IR MECH REMOV OBSTRUC MAT ANY COLON TUBE W/FLUORO 01/27/2016 Richarda Overlie, MD [facility]  . IR GENERIC HISTORICAL  02/07/2016   IR PATIENT EVAL TECH  0-60 MINS Darrell K Allred, PA-C [facility]  . IR GENERIC HISTORICAL  02/08/2016   IR GJ TUBE CHANGE 02/08/2016 Berdine Dance, MD [facility]  . IR GENERIC HISTORICAL  01/06/2016   IR GJ TUBE CHANGE 01/06/2016 [facility] OUT REF  . IR GENERIC HISTORICAL  05/02/2016   IR CM INJ ANY COLONIC TUBE W/FLUORO 05/02/2016 Oley Balm, MD [facility]  . IR GENERIC HISTORICAL  05/15/2016   IR GJ TUBE CHANGE 05/15/2016 Simonne Come, MD  [facility]  . IR GENERIC HISTORICAL  06/28/2016   IR GJ TUBE CHANGE 06/28/2016 [facility]  . LAPAROTOMY N/A 01/20/2015   Procedure: EXPLORATORY LAPAROTOMY;  Surgeon: Abigail Miyamoto, MD;  Location: [facility] OR;  Service: General;  Laterality: N/A;  . LYSIS OF ADHESION N/A 01/20/2015   Procedure: LYSIS OF ADHESIONS < 1 HOUR;  Surgeon: Abigail Miyamoto, MD;  Location: [facility] OR;  Service: General;  Laterality: N/A;  . TONSILLECTOMY    . TUBAL LIGATION    . VENTRAL HERNIA REPAIR  2015   incarcerated ventral hernia ([facility] 09/2013)/notes 01/10/2015   HPI:  83 yo female adm to [facility] with cough, DOE, wheeze. Pt has h/o dysphagia, paraesophageal hernia, GERD, gastric stenosis s/p G now J tube, ventral hernia with bowel obstruction, ? CVA.  Pt has prior swallow study done 05/2016 with recommendations for nectar liquids unless chin tuck can be fully completed due to silent aspiration of thin. CXR bilateral ATX, left base infiltrate not excluded 5/14.  Swallow evaluation ordered.       Assessment / Plan / Recommendation Clinical Impression  Pt has h/o multifactorial dysphagia including silent aspiration of thin, GERD and paraesophageal hernia.  SLP reviewed study images from 05/2016 MBS including reinforcing effective compensation strategies.  Pt with baseline cough that is productive to white viscous secretions.  Cough also observed during intake - much more prominent with thin vs nectar.  Pt reports consuming a nectar thickened liquid diet at home - no solids requiring mastication.  OME unremarkable and pt with strong productive cough.    Shannon [name] reports only having problems swallowing large pills at home - indicating them lodging - pointing to proximal esophagus.  She states this sensation clears with more nectar liquid swallows.  Upon review of last MBS, barium tablet lodged at vallecular space and appeared to lodge in esophagus - recommend strict precautions with large pills.    Recommend to continue liquid diet  - nectar thick with strict compensation strategies.   Using teach back, educated pt to recommendations and provided them in writing.  Pt will be chronic aspiration risk due to her multifactorial dysphagia.    Per note in computer from 5/10, daughter had requested palliative referral from her MD office- suspect may be beneficial to pursue at this time.    Will follow up for dysphagia management.   Thanks for this order.  SLP Visit Diagnosis: Dysphagia, oropharyngeal phase (R13.12)    Aspiration Risk       Diet Recommendation Nectar-thick liquid   Liquid Administration via: Cup;Straw Medication Administration: Whole meds with puree (cut in half if large) Supervision: Patient able to self feed Compensations: Slow rate;Small sips/bites;Follow solids with liquid Postural Changes: Seated upright at 90 degrees;Remain upright for at least 30 minutes after po intake    Other  Recommendations Oral Care Recommendations: Oral care BID   Follow up Recommendations        Frequency and Duration min 2x/week  1 week  Prognosis Prognosis for Safe Diet Advancement: Fair Barriers to Reach Goals: Time post onset      Swallow Study   General Date of Onset: 08/14/16 HPI: 83 yo female adm to [facility] with cough, DOE, wheeze. Pt has h/o dysphagia, paraesophageal hernia, GERD, gastric stenosis s/p G now J tube, ventral hernia with bowel obstruction, ? CVA.  Pt has prior swallow study done 05/2016 with recommendations for nectar liquids unless chin tuck can be fully completed due to silent aspiration of thin. CXR bilateral ATX, left base infiltrate not excluded 5/14.  Swallow evaluation ordered.     Type of Study: Bedside Swallow Evaluation Previous Swallow Assessment: see HPI Diet Prior to this Study:  (full liquids) Temperature Spikes Noted: Yes (low grade) Respiratory Status: Room air History of Recent Intubation: No Behavior/Cognition: Alert;Cooperative;Pleasant mood Oral Cavity Assessment: Within  Functional Limits Oral Care Completed by SLP: No Oral Cavity - Dentition: Edentulous Vision: Functional for self-feeding Self-Feeding Abilities: Able to feed self Patient Positioning: Upright in bed Baseline Vocal Quality: Normal Volitional Cough: Strong Volitional Swallow: Able to elicit    Oral/Motor/Sensory Function Overall Oral Motor/Sensory Function: Generalized oral weakness   Ice Chips Ice chips: Not tested   Thin Liquid Thin Liquid: Impaired Oral Phase Functional Implications: Oral holding Pharyngeal  Phase Impairments: Cough - Delayed Other Comments: significant coughing episode    Nectar Thick Nectar Thick Liquid: Impaired Oral phase functional implications: Prolonged oral transit Pharyngeal Phase Impairments: Cough - Delayed   Honey Thick Honey Thick Liquid: Not tested   Puree Puree: Not tested   Solid   GO   Solid: Not tested        Mills Koller, Shannon [facility] SLP [phone]

## 2016-08-14 NOTE — Progress Notes (Addendum)
ANTICOAGULATION CONSULT NOTE  Pharmacy Consult for Warfarin Indication: DVT and afib  Allergies  Allergen Reactions  . Penicillins Itching, Rash and Other (See Comments)    Tolerated unasyn, zosyn & cephalosporins in the past.  Has patient had a PCN reaction causing immediate rash, facial/tongue/throat swelling, SOB or lightheadedness with hypotension: Yes Has patient had a PCN reaction causing severe rash involving mucus membranes or skin necrosis: Unk Has patient had a PCN reaction that required hospitalization: Unk Has patient had a PCN reaction occurring within the last 10 years: Unk If all of the above answers are "NO", then may proceed with Cephalosporins  . Other     Patient tolerates amoxicillin and cephalosporins    Patient Measurements: Height: 5\' 6"  (167.6 cm) Weight: 150 lb 1.6 oz (68.1 kg) IBW/kg (Calculated) : 59.3 Heparin Dosing Weight:   Vital Signs: Temp: 98.7 F (37.1 C) (05/14 0539) Temp Source: Oral (05/14 0539) BP: 154/65 (05/14 0539) Pulse Rate: 100 (05/14 0539)  Labs:  Recent Labs  08/12/16 1400 08/12/16 1429 08/13/16 0520 08/14/16 0505  HGB 8.5*  --  8.7* 9.0*  HCT 25.7*  --  26.3* 27.0*  PLT 369  --  373 371  LABPROT  --  22.8* 21.5* 19.6*  INR  --  1.98 1.84 1.64  CREATININE 1.03*  --  1.16* 1.07*    Estimated Creatinine Clearance: 32.1 mL/min (A) (by C-G formula based on SCr of 1.07 mg/dL (H)).  Assessment: 63 YOF with hx of DVT and AFib on chronic warfarin prior to admission.  INR on admit < 2.  MD wants warfarin to start 5/13.  Home warfarin regimen: 3.75mg  daily with 2.5mg  on Mondays  Today, 08/14/2016:  INR subtherapeutic and trending down to 1.64 (looks like dose missed on 5/12)  CBC: Hgb low/stable, pltc WNL  Full liquid diet  No major drug interactions.  Azithromycin may potentiate warfarin's effects  Goal of Therapy:  INR 2-3  Plan:   Warfarin 4mg  PO x 1 today (normal dose on Mondays is 2.5mg )  Daily  INR  Monitor for bleeding  Doreene Eland, PharmD, BCPS.   Pager: [phone] 08/14/2016 7:44 AM

## 2016-08-14 NOTE — Progress Notes (Signed)
Initial Nutrition Assessment  DOCUMENTATION CODES:   Severe malnutrition in context of chronic illness  INTERVENTION:   Continue home TF regimen: Osmolite 1.5 @ 65 ml/hr x 12 hours (2100-0900) with 30 ml Prostat BID. Recommend free water flush of 120 ml before and after continuous feeds. Pt will need additional 240 ml TID either via GJ tube or PO. This regimen provides 1370 kcal, 79g protein and 1554 ml H2O.  Provide Nepro Shake po daily, each supplement provides 425 kcal and 19 grams protein  RD to continue to monitor  NUTRITION DIAGNOSIS:   Malnutrition related to chronic illness, dysphagia (paraesophageal hernia) as evidenced by moderate depletion of body fat, severe depletion of muscle mass.  GOAL:   Patient will meet greater than or equal to 90% of their needs  MONITOR:   PO intake, Supplement acceptance, Labs, Weight trends, TF tolerance, Skin, I & O's  REASON FOR ASSESSMENT:   Consult Enteral/tube feeding initiation and management  ASSESSMENT:   83 y.o. female with known COPD, CAD, HTN, CKD stage III, dementia, presented to [facility] ED with main concern of several days duration of progressively worsening dyspnea, initially present with exertion but has progressed to dyspnea at rest for the past 24 hours, associated with wheezing, non productive cough, chest tightness that occurs with coughing spells, poor oral intake., Please note pt is unable to provide much of the history due to dementia and family at bedside able to provide some information. In the ED the paient initially had significant dyspnea but improved with nebulizer treatments and solumedrol. Currently VSS, TRH asked to admit for further evaluation for COPD Exacerbation.  Patient in room with daughter at bedside. Pt reports drinking 1-2 Nepro shakes daily and she would prefer these over Glucerna during this admission. She is currently sipping on a [employer] and enjoys those as well. Pt and family both confirm that  pt receives Osmolite 1.5 @ 65 ml/hr x 12 hours every night from 9pm to 9am. Will place this order. SLP evaluated today: recommend nectar thickened liquids given dysphagia. Pt with thickener in room. TF regimen + supplements (1 Nepro daily) provides 1795 kcal and 98g protein.  Per chart review, pt has gained weight since 4/28. Nutrition-Focused physical exam completed. Findings are moderate fat depletion, severe muscle depletion, and no edema. Would contribute some degree of depletion to advanced age.  Medications: Lasix tablet daily Labs reviewed: GFR: 51  Diet Order:  Diet full liquid Room service appropriate? Yes; Fluid consistency: Nectar Thick  Skin:   L DM foot ulcer  Last BM:  PTA  Height:   Ht Readings from Last 1 Encounters:  08/12/16 5\' 6"  (1.676 m)    Weight:   Wt Readings from Last 1 Encounters:  08/14/16 150 lb 1.6 oz (68.1 kg)    Ideal Body Weight:  59.1 kg  BMI:  Body mass index is 24.23 kg/m.  Estimated Nutritional Needs:   Kcal:  1500-1700  Protein:  80-90g  Fluid:  1.5L/day  EDUCATION NEEDS:   Education needs addressed  Clayton Bibles, MS, RD, LDN Pager: [phone] After Hours Pager: [phone]

## 2016-08-14 NOTE — Progress Notes (Signed)
Physical Therapy Treatment Patient Details Name: Shannon [name] MRN: [id number] DOB: [birth year] Today's Date: 08/14/2016    History of Present Illness 83 yo female with PMH of hypothyroidism, symptomatic bradycardia (she declined PPM), CKD 3, GERD, COPD, and DVT with recent admission  for evaluation of progressive diabetic foot ulcer and gangrene of her second digit on the left toe. Pt now admitted with DOE, cough, wheezing.     PT Comments    Pt agreeable to mobilize however only tolerated 24 feet of ambulation due to L heel pain.  Follow Up Recommendations  Home health PT     Equipment Recommendations  None recommended by PT    Recommendations for Other Services       Precautions / Restrictions Precautions Precautions: Fall    Mobility  Bed Mobility               General bed mobility comments: pt up in recliner on arrival  Transfers Overall transfer level: Needs assistance Equipment used: Rolling walker (2 wheeled) Transfers: Sit to/from Stand Sit to Stand: Min guard         General transfer comment: dependent on UE assist for transfers  Ambulation/Gait Ambulation/Gait assistance: Min guard Ambulation Distance (Feet): 24 Feet Assistive device: Rolling walker (2 wheeled) Gait Pattern/deviations: Step-through pattern;Decreased stride length;Trunk flexed Gait velocity: decreased   General Gait Details: distance limited by L heel pain per pt, pt reports mild SOB however does not feel this is limiting, very slow pace, encouraged WBing through RW for pain control and upright posture   Stairs            Wheelchair Mobility    Modified Rankin (Stroke Patients Only)       Balance                                            Cognition Arousal/Alertness: Awake/alert Behavior During Therapy: WFL for tasks assessed/performed Overall Cognitive Status: Within Functional Limits for tasks assessed                                        Exercises      General Comments        Pertinent Vitals/Pain Pain Assessment: Faces Faces Pain Scale: Hurts little more Pain Location: L heel Pain Descriptors / Indicators: Sore Pain Intervention(s): Limited activity within patient's tolerance;Monitored during session;Repositioned    Home Living                      Prior Function            PT Goals (current goals can now be found in the care plan section) Progress towards PT goals: Progressing toward goals    Frequency           PT Plan Current plan remains appropriate    Co-evaluation              AM-PAC PT "6 Clicks" Daily Activity  Outcome Measure  Difficulty turning over in bed (including adjusting bedclothes, sheets and blankets)?: None Difficulty moving from lying on back to sitting on the side of the bed? : None Difficulty sitting down on and standing up from a chair with arms (e.g., wheelchair, bedside commode, etc,.)?: A Little Help needed moving to and from  a bed to chair (including a wheelchair)?: A Little Help needed walking in hospital room?: A Little Help needed climbing 3-5 steps with a railing? : A Lot 6 Click Score: 19    End of Session Equipment Utilized During Treatment: Gait belt Activity Tolerance: Patient tolerated treatment well Patient left: in chair;with call bell/phone within reach;with chair alarm set;with family/visitor present   PT Visit Diagnosis: Other abnormalities of gait and mobility (R26.89)     Time: 9485-4627 PT Time Calculation (min) (ACUTE ONLY): 18 min  Charges:  $Gait Training: 8-22 mins                    G Codes:       Zenovia Jarred, PT, DPT 08/14/2016 Pager: [phone]    Maida Sale E 08/14/2016, 12:40 PM

## 2016-08-14 NOTE — Consult Note (Signed)
[place] Nurse wound consult note Reason for Consult: Gangrene of left second toe, nonintact lesion to left great toe and tip of right great toe.  Wound type:Arterial  Pressure Injury POA: Yes Measurement: entire left second toe is necrotic and dry, shriveled.  Left great toe with 0.5 cm lesion on medial aspect/ Right great toe with 0.5 cm dry lesion to circumferential callous, seen at wound care center and has periodic debridements as appropriate.  Wound bed: dry Drainage (amount, consistency, odor) none Periwound:Cool, dry skin Dressing procedure/placement/frequency:Cleanse wounds to left first and second toe and right great toe with NS and pat gently dry.  Apply dry gauze and kerlix/tape daily.  Will not follow at this time.  Please re-consult if needed.  Domenic Moras RN BSN [place] Pager [phone]

## 2016-08-14 NOTE — Care Management Note (Signed)
Case Management Note  Patient Details  Name: Binta Statzer MRN: [id number] Date of Birth: 1923-07-03  Subjective/Objective: 83 y/o f admitted w/COPD. From home w/family.Has TF @ night. Per dtr-used [facility] in past, goes to wound care center-[place]. PT-recc HHPT.[facility] rep already following for HHRN/HHPT.                   Action/Plan:d/c plan home w/HHC.   Expected Discharge Date:                  Expected Discharge Plan:  [place]  In-House Referral:     Discharge planning Services  CM Consult  Post Acute Care Choice:    Choice offered to:  Adult Children  DME Arranged:    DME Agency:     HH Arranged:  RN, PT [place] Agency:  [place]  Status of Service:  In process, will continue to follow  If discussed at Long Length of Stay Meetings, dates discussed:    Additional Comments:  Dessa Phi, RN 08/14/2016, 1:41 PM

## 2016-08-14 NOTE — Progress Notes (Signed)
PROGRESS NOTE    Shannon [name]  ZHY:[id number] DOB: [birth year] DOA: 08/12/2016 PCP: Kirt Boys, DO  Brief Narrative:  Shannon [name] is a 83 y.o. female with known COPD, CAD, HTN, CKD stage III, dementia, presented to [facility] ED with main concern of several days duration of progressively worsening dyspnea, initially present with exertion but has progressed to dyspnea at rest for the past 24 hours, associated with wheezing, non productive cough, chest tightness that occurs with coughing spells, poor oral intake., Please note pt is unable to provide much of the history due to dementia and family at bedside able to provide some information. In the ED the paient initially had significant dyspnea but improved with nebulizer treatments and solumedrol. Currently VSS, TR[facility] asked to admit for further evaluation for COPD Exacerbation.  Assessment & Plan:   Active Problems:   Essential hypertension, benign   Dyslipidemia   Diabetes mellitus type 2 in nonobese (HCC)   Hypothyroidism   Depression   Chronic kidney insufficiency, stage 3 (moderate)   SOB (shortness of breath)   PEG (percutaneous endoscopic gastrostomy) status (HCC)   COPD (chronic obstructive pulmonary disease) (HCC)  Acute Exacerbation of COPD (chronic obstructive pulmonary disease) (HCC) with Hx of Asthma -Admitted to Telemetry -C/w Bronchodilators with DuoNebs 3 mL TID scheduled and with Albuterol 2.4 mg Nebs q2hprn for Wheezing -Started IV Azithromycin for COPD -C/w Fluticasone Nasal Spray 2 spray each Nare Daily -Added Guaifenesin 1200 mg po BID -Checked Respiratory Virus Panel and was Negative; Droplet Precautions Discontinued -Placed on Solumedrol 40 mg IV q6h with planned tapering as clinically indicated -C/w Supplemental O2 -Repeat CXR this AM showed Mediastinum hilar structures are normal. Cardiomegaly. Low lung volumes with bibasilar atelectasis. Left base infiltrate cannot be excluded. Tiny left pleural effusion cannot be  excluded. Nopneumothorax. 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 -Held lantus until oral intake improves -Placed on Sensitive Novolog SSI for now -CBG's ranged from 217-276 -C/w Home Gabapentin   HTN -BP Well controlled - Continue home medical regimen with Amlodipine 10 mg po Daily, Furosemide 20 mg Daily, Imdur 30 mg po Daily, and with Losartan 50 mg po Daily -Has Hydralazine 5 mg IV q4hprn for SBP >155 when needed  CKD stage III -Cr is at baseline -BUN/Cr went from 62/1.03 -> 63/1.16 -> 55/1.07 -Repeat CMP in AM  Gastric Stenosis/Outlet Obstruction s/p PEG -Full Liquid Diet during the Day; C/w Thick It  -SLP to Evaluate and recommended Nectar Thick Liquids -Gets Tube Feedings at Night and Dietician Consulted for help in management of Nightly Tube Feedings  -C/w Nepro 237 mL po q24h, Osmolite 1000 mL continuous, Prostat 30 mL per tube BID  Hx of DVT - Continue Coumadin per Pharmacy Dosing (INR was 1.64) -Repeat PT-INR in AM  Chronic Diastolic CHF -Appears to be Compensated  - continue Lasix 20 mg Daily per home medical regimen   Depression -C/w Sertraline 50 mg po Daily  Hyperlipidemia -C/w Atorvastatin 10 mg po Daily  Hypothyroidism -C/w Levothyroxine 137 mcg po Daily  Left Foot Second Toe Necrosis -WOC Nurse Consultation appreciated  -Restarted Home Clindamycin 300 mg po q8h  DVT prophylaxis: Anticoagulated with Coumadin  Code Status: DO NOT RESUSCITATE Family Communication: Discussed with Daugther at bedside Disposition Plan: Home Health PT when medically Stable.  Consultants:   None   Procedures: None   Antimicrobials:  Anti-infectives    Start     Dose/Rate  Route Frequency Ordered Stop   08/14/16 1100  clindamycin (CLEOCIN) capsule 300 mg     300 mg Oral Every 8 hours 08/14/16 1009     08/13/16 1000  azithromycin (ZITHROMAX) 500 mg in dextrose 5 % 250 mL  IVPB     500 mg 250 mL/hr over 60 Minutes Intravenous Every 24 hours 08/13/16 0943       Subjective: Seen and examined at bedside and states she only gets SOB when she coughs. Denied any CP or N/V. Had no other complaints and is very pleasant to speak with.   Objective: Vitals:   08/14/16 0539 08/14/16 0738 08/14/16 1411 08/14/16 1545  BP: (!) 154/65   130/68  Pulse: 100   97  Resp: 20   18  Temp: 98.7 F (37.1 C)   97.8 F (36.6 C)  TempSrc: Oral   Oral  SpO2: 97% 93% 97% 100%  Weight: 68.1 kg (150 lb 1.6 oz)     Height:        Intake/Output Summary (Last 24 hours) at 08/14/16 2101 Last data filed at 08/14/16 1017  Gross per 24 hour  Intake              630 ml  Output                0 ml  Net              630 ml   Filed Weights   08/12/16 1653 08/14/16 0539  Weight: 68.3 kg (150 lb 9.2 oz) 68.1 kg (150 lb 1.6 oz)   Examination: Physical Exam:  Constitutional: Pleasant 83 yo female in NAD Eyes: Sclerae anicteric. Lids normal ENMT:  Grossly normal hearing. External Ears and nose appear normal. Neck: Supple and no evidence of JVD Respiratory: Diminished breath sounds with some wheezing and mild rhonchi. Not tachypenic or any accessory muscles to breathe Cardiovascular: RRR, no LE edema Abdomen: Soft, NT, ND. PEG in place with no erythema GU: Deferred Musculoskeletal: No contractures Skin:. Left 2nd Toe necrosis noted. No rashes  Neurologic:  CN 2-12 grossly intact. No focal deficits Psychiatric: Awake and alert. Intact judgement and insight and normal mood and affect.  Data Reviewed: I have personally reviewed following labs and imaging studies  CBC:  Recent Labs Lab 08/12/16 1400 08/13/16 0520 08/14/16 0505  WBC 8.5 11.0* 16.6*  NEUTROABS  --   --  15.8*  HGB 8.5* 8.7* 9.0*  HCT 25.7* 26.3* 27.0*  MCV 88.0 88.3 87.4  PLT 369 373 371   Basic Metabolic Panel:  Recent Labs Lab 08/12/16 1400 08/13/16 0520 08/14/16 0505  NA 133* 136 138  K 4.5 4.7  3.8  CL 98* 100* 101  CO2 26 25 25   GLUCOSE 186* 221* 238*  BUN 62* 63* 55*  CREATININE 1.03* 1.16* 1.07*  CALCIUM 9.3 10.0 9.8  MG  --   --  2.1  PHOS  --   --  3.7   GFR: Estimated Creatinine Clearance: 32.1 mL/min (A) (by C-G formula based on SCr of 1.07 mg/dL (H)). Liver Function Tests:  Recent Labs Lab 08/14/16 0505  AST 59*  ALT 33  ALKPHOS 59  BILITOT 0.6  PROT 8.1  ALBUMIN 3.2*   No results for input(s): LIPASE, AMYLASE in the last 168 hours. No results for input(s): AMMONIA in the last 168 hours. Coagulation Profile:  Recent Labs Lab 08/11/16 1433 08/12/16 1429 08/13/16 0520 08/14/16 0505  INR 2.4 1.98 1.84 1.64  Cardiac Enzymes: No results for input(s): CKTOTAL, CKMB, CKMBINDEX, TROPONINI in the last 168 hours. BNP (last 3 results)  Recent Labs  05/08/16 1053  PROBNP 1,132*   HbA1C: No results for input(s): HGBA1C in the last 72 hours. CBG:  Recent Labs Lab 08/13/16 1634 08/13/16 2027 08/14/16 0802 08/14/16 1126 08/14/16 1649  GLUCAP 228* 222* 217* 285* 276*   Lipid Profile: No results for input(s): CHOL, HDL, LDLCALC, TRIG, CHOLHDL, LDLDIRECT in the last 72 hours. Thyroid Function Tests: No results for input(s): TSH, T4TOTAL, FREET4, T3FREE, THYROIDAB in the last 72 hours. Anemia Panel: No results for input(s): VITAMINB12, FOLATE, FERRITIN, TIBC, IRON, RETICCTPCT in the last 72 hours. Sepsis Labs: No results for input(s): PROCALCITON, LATICACIDVEN in the last 168 hours.  Recent Results (from the past 240 hour(s))  Respiratory Panel by PCR     Status: None   Collection Time: 08/13/16 11:37 AM  Result Value Ref Range Status   Adenovirus NOT DETECTED NOT DETECTED Final   Coronavirus 229E NOT DETECTED NOT DETECTED Final   Coronavirus HKU1 NOT DETECTED NOT DETECTED Final   Coronavirus NL63 NOT DETECTED NOT DETECTED Final   Coronavirus OC43 NOT DETECTED NOT DETECTED Final   Metapneumovirus NOT DETECTED NOT DETECTED Final   Rhinovirus  / Enterovirus NOT DETECTED NOT DETECTED Final   Influenza A NOT DETECTED NOT DETECTED Final   Influenza B NOT DETECTED NOT DETECTED Final   Parainfluenza Virus 1 NOT DETECTED NOT DETECTED Final   Parainfluenza Virus 2 NOT DETECTED NOT DETECTED Final   Parainfluenza Virus 3 NOT DETECTED NOT DETECTED Final   Parainfluenza Virus 4 NOT DETECTED NOT DETECTED Final   Respiratory Syncytial Virus NOT DETECTED NOT DETECTED Final   Bordetella pertussis NOT DETECTED NOT DETECTED Final   Chlamydophila pneumoniae NOT DETECTED NOT DETECTED Final   Mycoplasma pneumoniae NOT DETECTED NOT DETECTED Final    Comment: Performed at [facility], [address], [place], [place] [address]    Radiology Studies: Dg Chest Port 1 View  Result Date: 08/14/2016 CLINICAL DATA:  Shortness of breath.  Cough. EXAM: PORTABLE CHEST 1 VIEW COMPARISON:  08/12/2016. FINDINGS: Mediastinum hilar structures are normal. Cardiomegaly. Low lung volumes with bibasilar atelectasis. Left base infiltrate cannot be excluded. Tiny left pleural effusion cannot be excluded. No pneumothorax. IMPRESSION: 1. Low lung volumes with bibasilar atelectasis. Left base infiltrate cannot be excluded. Small left pleural effusion cannot be excluded. 2. Stable cardiomegaly. Electronically Signed   By: Maisie Fus  Register   On: 08/14/2016 06:41   Scheduled Meds: . amLODipine  10 mg Oral Daily  . atorvastatin  10 mg Oral Daily  . clindamycin  300 mg Oral Q8H  . cycloSPORINE  1 drop Both Eyes BID  . feeding supplement (NEPRO CARB STEADY)  237 mL Oral Q24H  . feeding supplement (PRO-STAT SUGAR FREE 64)  30 mL Per Tube BID  . fluticasone  2 spray Each Nare Daily  . furosemide  20 mg Oral Daily  . gabapentin  300 mg Oral 2 times per day  . [START ON 08/15/2016] gabapentin  600 mg Oral Q breakfast  . guaiFENesin  1,200 mg Oral BID  . insulin aspart  0-9 Units Subcutaneous TID WC  . ipratropium-albuterol  3 mL Nebulization TID  . isosorbide mononitrate   30 mg Oral Daily  . latanoprost  1 drop Both Eyes QHS  . levothyroxine  137 mcg Oral QAC breakfast  . losartan  50 mg Oral Daily  . methylPREDNISolone (SOLU-MEDROL) injection  40 mg Intravenous Q6H  . polyvinyl alcohol  1 drop Both Eyes QID  . sertraline  50 mg Oral Daily  . sodium chloride flush  3 mL Intravenous Q12H  . Warfarin - Pharmacist Dosing Inpatient   Does not apply q1800   Continuous Infusions: . azithromycin Stopped (08/14/16 1017)  . feeding supplement (OSMOLITE 1.5 CAL)      LOS: 2 days   Merlene Laughter, DO [facility] Pager [phone]  If 7PM-7AM, please contact night-coverage [email or web address] Password [email or web address] 08/14/2016, 9:01 PM

## 2016-08-15 ENCOUNTER — Other Ambulatory Visit: Payer: Self-pay | Admitting: Cardiology

## 2016-08-15 DIAGNOSIS — E876 Hypokalemia: Secondary | ICD-10-CM

## 2016-08-15 LAB — CBC WITH DIFFERENTIAL/PLATELET
Basophils Absolute: 0 10*3/uL (ref 0.0–0.1)
Basophils Relative: 0 %
Eosinophils Absolute: 0 10*3/uL (ref 0.0–0.7)
Eosinophils Relative: 0 %
HCT: 27.1 % — ABNORMAL LOW (ref 36.0–46.0)
Hemoglobin: 8.9 g/dL — ABNORMAL LOW (ref 12.0–15.0)
Lymphocytes Relative: 7 %
Lymphs Abs: 0.8 10*3/uL (ref 0.7–4.0)
MCH: 29 pg (ref 26.0–34.0)
MCHC: 32.8 g/dL (ref 30.0–36.0)
MCV: 88.3 fL (ref 78.0–100.0)
Monocytes Absolute: 0.2 10*3/uL (ref 0.1–1.0)
Monocytes Relative: 2 %
Neutro Abs: 11 10*3/uL — ABNORMAL HIGH (ref 1.7–7.7)
Neutrophils Relative %: 91 %
Platelets: 348 10*3/uL (ref 150–400)
RBC: 3.07 MIL/uL — ABNORMAL LOW (ref 3.87–5.11)
RDW: 18 % — ABNORMAL HIGH (ref 11.5–15.5)
WBC: 12.1 10*3/uL — ABNORMAL HIGH (ref 4.0–10.5)

## 2016-08-15 LAB — PROTIME-INR
INR: 1.96
Prothrombin Time: 22.6 seconds — ABNORMAL HIGH (ref 11.4–15.2)

## 2016-08-15 LAB — COMPREHENSIVE METABOLIC PANEL
ALT: 40 U/L (ref 14–54)
AST: 51 U/L — ABNORMAL HIGH (ref 15–41)
Albumin: 3.1 g/dL — ABNORMAL LOW (ref 3.5–5.0)
Alkaline Phosphatase: 62 U/L (ref 38–126)
Anion gap: 11 (ref 5–15)
BUN: 64 mg/dL — ABNORMAL HIGH (ref 6–20)
CO2: 23 mmol/L (ref 22–32)
Calcium: 9.3 mg/dL (ref 8.9–10.3)
Chloride: 102 mmol/L (ref 101–111)
Creatinine, Ser: 1.11 mg/dL — ABNORMAL HIGH (ref 0.44–1.00)
GFR calc Af Amer: 49 mL/min — ABNORMAL LOW (ref >60)
GFR calc non Af Amer: 42 mL/min — ABNORMAL LOW (ref >60)
Glucose, Bld: 451 mg/dL — ABNORMAL HIGH (ref 65–99)
Potassium: 3.3 mmol/L — ABNORMAL LOW (ref 3.5–5.1)
Sodium: 136 mmol/L (ref 135–145)
Total Bilirubin: 0.5 mg/dL (ref 0.3–1.2)
Total Protein: 7.8 g/dL (ref 6.5–8.1)

## 2016-08-15 LAB — PHOSPHORUS: Phosphorus: 2.4 mg/dL — ABNORMAL LOW (ref 2.5–4.6)

## 2016-08-15 LAB — MAGNESIUM: Magnesium: 2.1 mg/dL (ref 1.7–2.4)

## 2016-08-15 LAB — GLUCOSE, CAPILLARY
Glucose-Capillary: 191 mg/dL — ABNORMAL HIGH (ref 65–99)
Glucose-Capillary: 255 mg/dL — ABNORMAL HIGH (ref 65–99)
Glucose-Capillary: 262 mg/dL — ABNORMAL HIGH (ref 65–99)
Glucose-Capillary: 266 mg/dL — ABNORMAL HIGH (ref 65–99)
Glucose-Capillary: 361 mg/dL — ABNORMAL HIGH (ref 65–99)
Glucose-Capillary: 363 mg/dL — ABNORMAL HIGH (ref 65–99)
Glucose-Capillary: 481 mg/dL — ABNORMAL HIGH (ref 65–99)

## 2016-08-15 IMAGING — DX DG ABD PORTABLE 1V
1 series · 1 of 1 positions shown · non-contrast
Comparison: 03/14/2014

CLINICAL DATA: Nasogastric tube placement.

EXAM:
PORTABLE ABDOMEN - 1 VIEW

[abdomen kub]
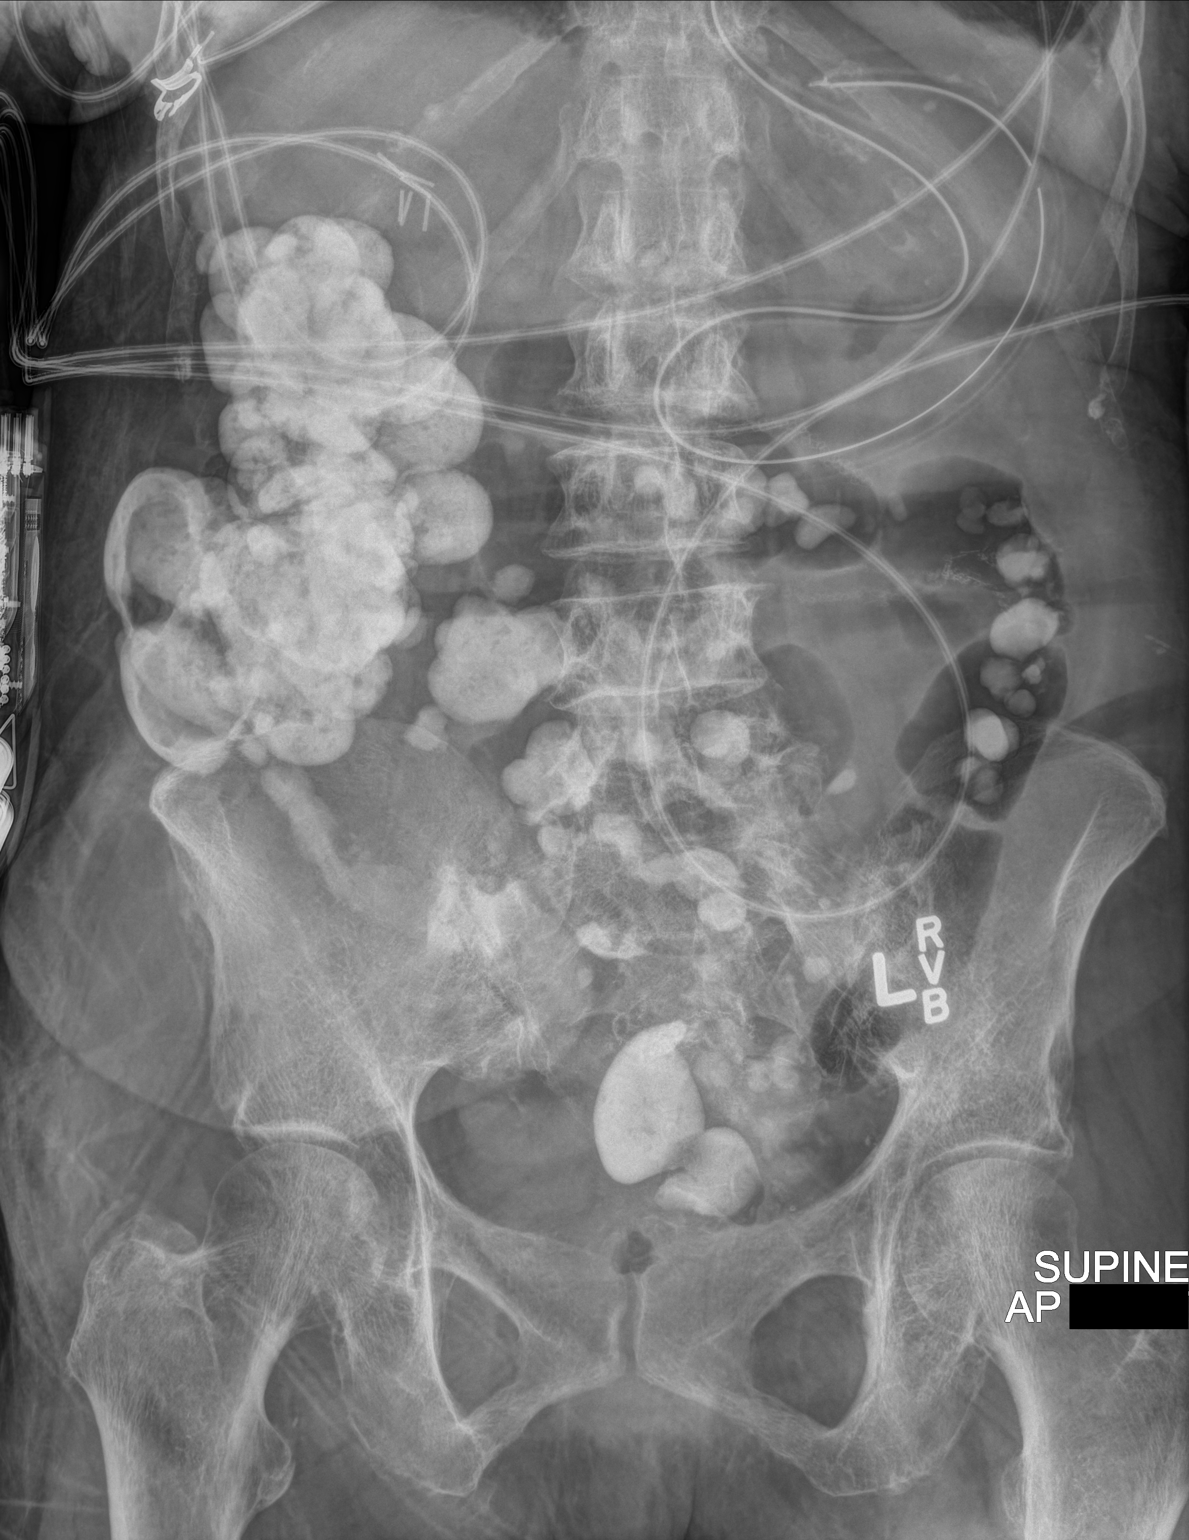

[1 of 1 positions shown; findings below may reference images not displayed]

FINDINGS: The nasogastric tube is in place, tip coiled within the stomach and
tip overlying the region of the fundus. Surgical clips are noted in
the right upper quadrant. There is residual contrast in the colon
and distal small bowel following esophagram on 03/13/2014. Bowel gas
pattern is nonobstructive.
IMPRESSION: Nasogastric tube coiled within the stomach.

## 2016-08-15 MED ORDER — INSULIN ASPART 100 UNIT/ML ~~LOC~~ SOLN: 0.0000 [IU] | SUBCUTANEOUS | Status: DC

## 2016-08-15 MED ORDER — POTASSIUM CHLORIDE 20 MEQ/15ML (10%) PO SOLN
40.0000 meq | Freq: Two times a day (BID) | ORAL | Status: AC
  Administered 2016-08-15 (×2): 40 meq via ORAL
  Filled 2016-08-15 (×2): qty 30

## 2016-08-15 MED ORDER — INSULIN ASPART 100 UNIT/ML ~~LOC~~ SOLN
0.0000 [IU] | SUBCUTANEOUS | Status: DC
  Administered 2016-08-15: 8 [IU] via SUBCUTANEOUS
  Administered 2016-08-15: 3 [IU] via SUBCUTANEOUS
  Administered 2016-08-15 (×2): 8 [IU] via SUBCUTANEOUS
  Administered 2016-08-15: 15 [IU] via SUBCUTANEOUS
  Administered 2016-08-16 (×2): 11 [IU] via SUBCUTANEOUS
  Administered 2016-08-16: 3 [IU] via SUBCUTANEOUS
  Administered 2016-08-16: 11 [IU] via SUBCUTANEOUS
  Administered 2016-08-16: 8 [IU] via SUBCUTANEOUS
  Administered 2016-08-17: 3 [IU] via SUBCUTANEOUS
  Administered 2016-08-17: 5 [IU] via SUBCUTANEOUS
  Administered 2016-08-17 (×2): 3 [IU] via SUBCUTANEOUS
  Administered 2016-08-18 (×2): 2 [IU] via SUBCUTANEOUS
  Administered 2016-08-18: 3 [IU] via SUBCUTANEOUS

## 2016-08-15 MED ORDER — INSULIN GLARGINE 100 UNIT/ML ~~LOC~~ SOLN
5.0000 [IU] | Freq: Every day | SUBCUTANEOUS | Status: DC
  Filled 2016-08-15: qty 0.05

## 2016-08-15 MED ORDER — METHYLPREDNISOLONE SODIUM SUCC 40 MG IJ SOLR
40.0000 mg | Freq: Two times a day (BID) | INTRAMUSCULAR | Status: DC
  Administered 2016-08-15 – 2016-08-16 (×2): 40 mg via INTRAVENOUS
  Filled 2016-08-15 (×2): qty 1

## 2016-08-15 MED ORDER — WARFARIN SODIUM 2.5 MG PO TABS
3.7500 mg | ORAL_TABLET | Freq: Once | ORAL | Status: AC
  Administered 2016-08-15: 3.75 mg via ORAL
  Filled 2016-08-15 (×2): qty 1.5

## 2016-08-15 MED ORDER — INSULIN GLARGINE 100 UNIT/ML ~~LOC~~ SOLN
8.0000 [IU] | Freq: Every day | SUBCUTANEOUS | Status: DC
  Administered 2016-08-15 – 2016-08-16 (×2): 8 [IU] via SUBCUTANEOUS
  Filled 2016-08-15 (×3): qty 0.08

## 2016-08-15 MED ORDER — INSULIN ASPART 100 UNIT/ML ~~LOC~~ SOLN
10.0000 [IU] | Freq: Once | SUBCUTANEOUS | Status: AC
  Administered 2016-08-15: 10 [IU] via SUBCUTANEOUS

## 2016-08-15 NOTE — Progress Notes (Addendum)
ANTICOAGULATION CONSULT NOTE  Pharmacy Consult for Warfarin Indication: DVT and afib  Allergies  Allergen Reactions  . Penicillins Itching, Rash and Other (See Comments)    Tolerated unasyn, zosyn & cephalosporins in the past.  Has patient had a PCN reaction causing immediate rash, facial/tongue/throat swelling, SOB or lightheadedness with hypotension: Yes Has patient had a PCN reaction causing severe rash involving mucus membranes or skin necrosis: Unk Has patient had a PCN reaction that required hospitalization: Unk Has patient had a PCN reaction occurring within the last 10 years: Unk If all of the above answers are "NO", then may proceed with Cephalosporins  . Other     Patient tolerates amoxicillin and cephalosporins    Patient Measurements: Height: 5\' 6"  (167.6 cm) Weight: 144 lb 2.9 oz (65.4 kg) IBW/kg (Calculated) : 59.3 Heparin Dosing Weight:   Vital Signs: Temp: 98.1 F (36.7 C) (05/15 0435) Temp Source: Oral (05/15 0435) BP: 152/67 (05/15 0435) Pulse Rate: 100 (05/15 0435)  Labs:  Recent Labs  08/13/16 0520 08/14/16 0505 08/15/16 0545  HGB 8.7* 9.0* 8.9*  HCT 26.3* 27.0* 27.1*  PLT 373 371 348  LABPROT 21.5* 19.6* 22.6*  INR 1.84 1.64 1.96  CREATININE 1.16* 1.07* 1.11*    Estimated Creatinine Clearance: 30.9 mL/min (A) (by C-G formula based on SCr of 1.11 mg/dL (H)).  Assessment: 37 YOF with hx of DVT and AFib on chronic warfarin prior to admission.  INR on admit < 2.  MD wants warfarin to start 5/13.  Home warfarin regimen: 3.75mg  daily with 2.5mg  on Mondays  Today, 08/15/2016:  INR subtherapeutic but closer to goal this am (= 1.94 (looks like dose missed on 5/12)  CBC: Hgb low/stable, pltc WNL  Full liquid diet/TF qhs resumed 5/14 hs  No major drug interactions.  Azithromycin and corticosteroids may potentiate warfarin's effects  Goal of Therapy:  INR 2-3  Plan:   Warfarin 3.75mg  PO tonight (as per home dose)  Daily INR  Monitor for  bleeding  Doreene Eland, PharmD, BCPS.   Pager: [phone] 08/15/2016 7:33 AM

## 2016-08-15 NOTE — Progress Notes (Signed)
Inpatient Diabetes Program Recommendations  AACE/ADA: New Consensus Statement on Inpatient Glycemic Control (2015)  Target Ranges:  Prepandial:   less than 140 mg/dL      Peak postprandial:   less than 180 mg/dL (1-2 hours)      Critically ill patients:  140 - 180 mg/dL   Lab Results  Component Value Date   GLUCAP 255 (H) 08/15/2016   HGBA1C 8.0 (H) 05/30/2016    Review of Glycemic Control  Diabetes history: DM2 Outpatient Diabetes medications: Lantus 5 units QHS Current orders for Inpatient glycemic control: Lantus 5 units QHS, Novolog 0-15 units Q4H  HgbA1C - 8% Solumedrol 40 units Q12H - titrating down.  Inpatient Diabetes Program Recommendations:    Increase Lantus to 8 units QHS Agree with Novolog 0-15 units Q4H.  Will likely need rapid-acting insulin at home, along with the Lantus.  Continue to follow. Thank you. Lorenda Peck, RD, LDN, CDE Inpatient Diabetes Coordinator [phone]

## 2016-08-15 NOTE — Progress Notes (Addendum)
PROGRESS NOTE    Shannon [name]  GNF:[id number] DOB: 12/31/1923 DOA: 08/12/2016 PCP: Kirt Boys, DO  Brief Narrative:  Shannon [name] is a 83 y.o. female with known COPD, CAD, HTN, CKD stage III, dementia, presented to [facility] ED with main concern of several days duration of progressively worsening dyspnea, initially present with exertion but has progressed to dyspnea at rest for the past 24 hours, associated with wheezing, non productive cough, chest tightness that occurs with coughing spells, poor oral intake. In the ED the paient initially had significant dyspnea but improved with nebulizer treatments and solumedrol. Currently VSS, TRH asked to admit for further evaluation for COPD Exacerbation. Patient is breathing a little better but complains of SOB when coughing.   Assessment & Plan:   Active Problems:   Essential hypertension, benign   Dyslipidemia   Diabetes mellitus type 2 in nonobese (HCC)   Hypothyroidism   Depression   Chronic kidney insufficiency, stage 3 (moderate)   SOB (shortness of breath)   PEG (percutaneous endoscopic gastrostomy) status (HCC)   COPD (chronic obstructive pulmonary disease) (HCC)   Hypokalemia  Acute Exacerbation of COPD (chronic obstructive pulmonary disease) (HCC) with Hx of Asthma -Admitted to Telemetry -C/w Bronchodilators with DuoNebs 3 mL TID scheduled and with Albuterol 2.4 mg Nebs q2hprn for Wheezing -Started IV Azithromycin for COPD (Day 3) -C/w Fluticasone Nasal Spray 2 spray each Nare Daily -Added Guaifenesin 1200 mg po BID -Checked Respiratory Virus Panel and was Negative; Droplet Precautions Discontinued -Placed on Solumedrol 40 mg IV q6h with planned tapering as clinically indicated -C/w Supplemental O2 -Repeat CXR this AM showed Mediastinum hilar structures are normal. Cardiomegaly. Low lung volumes with bibasilar atelectasis. Left base infiltrate cannot be excluded. Tiny left pleural effusion cannot be excluded. No pneumothorax. 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 -Increased Sensitive Novolog SSI q4h to Moderate Novolog SSI q4h -CBG's ranged from 276-481 -Last HbA1c on 05/30/16 was 8.; Repeat HbA1c this visit -Appreciate Diabetic Education Coordinator Assistance -Restart Home Lantus 5 units sq Daily and will increase to 8 units sq Daily per Diabetic Education Coordinator Recc -C/w Home Gabapentin Regimen   HTN -BP was 156/78 -Continue home medical regimen with Amlodipine 10 mg po Daily, Furosemide 20 mg Daily, Imdur 30 mg po Daily, and with Losartan 50 mg po Daily -Has Hydralazine 5 mg IV q4hprn for SBP >155 when needed  Hypokalemia -Patient's K+ went from 3.8 -> 3.3 -Replete with KCl Liquid 40 mEQ BID x 2 doses -Repeat CMP in AM  CKD stage III -Cr is at baseline -BUN/Cr went from 62/1.03 -> 63/1.16 -> 55/1.07 -> 64/1.11 -Repeat CMP in AM  Gastric Stenosis/Outlet Obstruction s/p PEG -Full Liquid Diet during the Day; C/w Thick It  -SLP to Evaluate and recommended Nectar Thick Liquids -Gets Tube Feedings at Night and Dietician Consulted for help in management of Nightly Tube Feedings  -C/w Nepro 237 mL po q24h, Osmolite 1000 mL continuous, Prostat 30 mL per tube BID  Hx of DVT - Continue Coumadin per Pharmacy Dosing (INR was 1.64 and improved to 1.96 -Patient to get 3.75 mg po Tonight  -Repeat PT-INR in AM -Continue to Monitor for S/Sx of bleeding  Chronic Diastolic CHF -Appears to be Compensated  -Continue Lasix 20 mg Daily per home medical regimen   Depression -C/w Sertraline 50 mg po Daily  Hyperlipidemia -C/w Atorvastatin 10 mg po  Daily  Hypothyroidism -C/w Levothyroxine 137 mcg po Daily  Chronic Normocytic Anemia -Patient's Hb/Hct went from 9.0/27.0 -> 8.9/27.1 -No S/Sx of Bleeding -Repeat CBC in AM  Left Foot Second Toe Necrosis -WOC Nurse Consultation appreciated;  Patient would like to see WOC nurse again today.  -Restarted Home Clindamycin 300 mg po q8h  DVT prophylaxis: Anticoagulated with Coumadin  Code Status: DO NOT RESUSCITATE Family Communication: Discussed with Daugther at bedside Disposition Plan: Home Health PT when medically Stable in 24-48 hours  Consultants:   None   Procedures: None   Antimicrobials:  Anti-infectives    Start     Dose/Rate Route Frequency Ordered Stop   08/14/16 1100  clindamycin (CLEOCIN) capsule 300 mg     300 mg Oral Every 8 hours 08/14/16 1009     08/13/16 1000  azithromycin (ZITHROMAX) 500 mg in dextrose 5 % 250 mL IVPB     500 mg 250 mL/hr over 60 Minutes Intravenous Every 24 hours 08/13/16 0943       Subjective: Seen and examined at bedside and was doing much better but only complained of SOB when coughing. States she is concerned about her blood sugar being high. No Nausea or vomiting and would like to speak with the [facility] nurse again.    Objective: Vitals:   08/15/16 0435 08/15/16 0838 08/15/16 1416 08/15/16 1434  BP: (!) 152/67   (!) 156/78  Pulse: 100   94  Resp: 18   18  Temp: 98.1 F (36.7 C)   98 F (36.7 C)  TempSrc: Oral   Oral  SpO2: 100% 99% 95% 94%  Weight: 65.4 kg (144 lb 2.9 oz)     Height:       No intake or output data in the 24 hours ending 08/15/16 1611 Filed Weights   08/12/16 1653 08/14/16 0539 08/15/16 0435  Weight: 68.3 kg (150 lb 9.2 oz) 68.1 kg (150 lb 1.6 oz) 65.4 kg (144 lb 2.9 oz)   Examination: Physical Exam:  Constitutional: Very pleasant 83 yo AAF in NAD sitting bedside Eyes: Sclerae anicteric. Conjuntivae non-injected. Lids Normal ENMT:  External Ears and Nose appear normal. Mucous membranes appear moist. Patient has grossly normal hearing. Neck: No JVD or evidence of thyromegaly Respiratory: Diminished on left side with crackles and wheezing. Patient not tachypenic or using any accessory muscles to breath. Cardiovascular: RRR S1, S2, no lower extremity  edema Abdomen: Soft, NT, ND. Bowel sounds present. PEG tube in place without erythema GU: Deferred Musculoskeletal: No contractures. Good ROM. Skin:. Left 2nd to necrotic. Warm and dry. No rashes Neurologic:  CN 2-12 grossly intact. No focal deficits. Psychiatric: Pleasant mood and affect. Intact Judgement and insight. Awake and alert.   Data Reviewed: I have personally reviewed following labs and imaging studies  CBC:  Recent Labs Lab 08/12/16 1400 08/13/16 0520 08/14/16 0505 08/15/16 0545  WBC 8.5 11.0* 16.6* 12.1*  NEUTROABS  --   --  15.8* 11.0*  HGB 8.5* 8.7* 9.0* 8.9*  HCT 25.7* 26.3* 27.0* 27.1*  MCV 88.0 88.3 87.4 88.3  PLT 369 373 371 348   Basic Metabolic Panel:  Recent Labs Lab 08/12/16 1400 08/13/16 0520 08/14/16 0505 08/15/16 0545  NA 133* 136 138 136  K 4.5 4.7 3.8 3.3*  CL 98* 100* 101 102  CO2 26 25 25 23   GLUCOSE 186* 221* 238* 451*  BUN 62* 63* 55* 64*  CREATININE 1.03* 1.16* 1.07* 1.11*  CALCIUM 9.3 10.0 9.8 9.3  MG  --   --  2.1 2.1  PHOS  --   --  3.7 2.4*   GFR: Estimated Creatinine Clearance: 30.9 mL/min (A) (by C-G formula based on SCr of 1.11 mg/dL (H)). Liver Function Tests:  Recent Labs Lab 08/14/16 0505 08/15/16 0545  AST 59* 51*  ALT 33 40  ALKPHOS 59 62  BILITOT 0.6 0.5  PROT 8.1 7.8  ALBUMIN 3.2* 3.1*   No results for input(s): LIPASE, AMYLASE in the last 168 hours. No results for input(s): AMMONIA in the last 168 hours. Coagulation Profile:  Recent Labs Lab 08/11/16 1433 08/12/16 1429 08/13/16 0520 08/14/16 0505 08/15/16 0545  INR 2.4 1.98 1.84 1.64 1.96   Cardiac Enzymes: No results for input(s): CKTOTAL, CKMB, CKMBINDEX, TROPONINI in the last 168 hours. BNP (last 3 results)  Recent Labs  05/08/16 1053  PROBNP 1,132*   HbA1C: No results for input(s): HGBA1C in the last 72 hours. CBG:  Recent Labs Lab 08/14/16 2213 08/15/16 0012 08/15/16 0413 08/15/16 0744 08/15/16 1130  GLUCAP 309* 363* 481*  361* 255*   Lipid Profile: No results for input(s): CHOL, HDL, LDLCALC, TRIG, CHOLHDL, LDLDIRECT in the last 72 hours. Thyroid Function Tests: No results for input(s): TSH, T4TOTAL, FREET4, T3FREE, THYROIDAB in the last 72 hours. Anemia Panel: No results for input(s): VITAMINB12, FOLATE, FERRITIN, TIBC, IRON, RETICCTPCT in the last 72 hours. Sepsis Labs: No results for input(s): PROCALCITON, LATICACIDVEN in the last 168 hours.  Recent Results (from the past 240 hour(s))  Respiratory Panel by PCR     Status: None   Collection Time: 08/13/16 11:37 AM  Result Value Ref Range Status   Adenovirus NOT DETECTED NOT DETECTED Final   Coronavirus 229E NOT DETECTED NOT DETECTED Final   Coronavirus HKU1 NOT DETECTED NOT DETECTED Final   Coronavirus NL63 NOT DETECTED NOT DETECTED Final   Coronavirus OC43 NOT DETECTED NOT DETECTED Final   Metapneumovirus NOT DETECTED NOT DETECTED Final   Rhinovirus / Enterovirus NOT DETECTED NOT DETECTED Final   Influenza A NOT DETECTED NOT DETECTED Final   Influenza B NOT DETECTED NOT DETECTED Final   Parainfluenza Virus 1 NOT DETECTED NOT DETECTED Final   Parainfluenza Virus 2 NOT DETECTED NOT DETECTED Final   Parainfluenza Virus 3 NOT DETECTED NOT DETECTED Final   Parainfluenza Virus 4 NOT DETECTED NOT DETECTED Final   Respiratory Syncytial Virus NOT DETECTED NOT DETECTED Final   Bordetella pertussis NOT DETECTED NOT DETECTED Final   Chlamydophila pneumoniae NOT DETECTED NOT DETECTED Final   Mycoplasma pneumoniae NOT DETECTED NOT DETECTED Final    Comment: Performed at [facility], [address], [place], [place] [address]    Radiology Studies: Dg Chest Port 1 View  Result Date: 08/14/2016 CLINICAL DATA:  Shortness of breath.  Cough. EXAM: PORTABLE CHEST 1 VIEW COMPARISON:  08/12/2016. FINDINGS: Mediastinum hilar structures are normal. Cardiomegaly. Low lung volumes with bibasilar atelectasis. Left base infiltrate cannot be excluded. Tiny left  pleural effusion cannot be excluded. No pneumothorax. IMPRESSION: 1. Low lung volumes with bibasilar atelectasis. Left base infiltrate cannot be excluded. Small left pleural effusion cannot be excluded. 2. Stable cardiomegaly. Electronically Signed   By: Maisie Fus  Register   On: 08/14/2016 06:41   Scheduled Meds: . amLODipine  10 mg Oral Daily  . atorvastatin  10 mg Oral Daily  . clindamycin  300 mg Oral Q8H  . cycloSPORINE  1 drop Both Eyes BID  . feeding supplement (NEPRO CARB STEADY)  237 mL Oral Q24H  . feeding supplement (PRO-STAT SUGAR  FREE 64)  30 mL Per Tube BID  . fluticasone  2 spray Each Nare Daily  . furosemide  20 mg Oral Daily  . gabapentin  300 mg Oral 2 times per day  . gabapentin  600 mg Oral Q breakfast  . guaiFENesin  1,200 mg Oral BID  . insulin aspart  0-15 Units Subcutaneous Q4H  . insulin glargine  8 Units Subcutaneous Q2200  . ipratropium-albuterol  3 mL Nebulization TID  . isosorbide mononitrate  30 mg Oral Daily  . latanoprost  1 drop Both Eyes QHS  . levothyroxine  137 mcg Oral QAC breakfast  . losartan  50 mg Oral Daily  . methylPREDNISolone (SOLU-MEDROL) injection  40 mg Intravenous Q12H  . polyvinyl alcohol  1 drop Both Eyes QID  . potassium chloride  40 mEq Oral BID  . sertraline  50 mg Oral Daily  . sodium chloride flush  3 mL Intravenous Q12H  . warfarin  3.75 mg Oral ONCE-1800  . Warfarin - Pharmacist Dosing Inpatient   Does not apply q1800   Continuous Infusions: . azithromycin Stopped (08/15/16 1200)  . feeding supplement (OSMOLITE 1.5 CAL) Stopped (08/15/16 0922)    LOS: 3 days   Merlene Laughter, DO [facility] Pager [phone]  If 7PM-7AM, please contact night-coverage [email or web address] Password [email or web address] 08/15/2016, 4:11 PM

## 2016-08-15 NOTE — Consult Note (Signed)
[facility] CM Inpatient Consult   08/15/2016  Shannon [name] 11-09-1923 [id number]   Spoke with inpatient RNCM to make aware, per chart review, that patient's daughter requested outpatient palliative referral from MD office on May 10th.   Shannon [name] has had multiple admissions. Shannon [name] daughter has declined [facility] Care Management program thus far.  However, writer wanted to make inpatient team aware of the above.    Raiford Noble, MSN-Ed, RN,BSN [facility] Liaison [phone]

## 2016-08-15 NOTE — Progress Notes (Signed)
Careteam:  Patient Care Team:  Gildardo Cranker, DO as PCP - General (Internal Medicine)  Riccardo Dubin, MD as Referring Physician (Cardiology)  Alveta Heimlich, MD as Referring Physician (Ophthalmology)  Florance, Tomasa Blase, RN as [facility]  Edrick Kins, DPM as Consulting Physician (Podiatry)  Advanced Directive information  Does Patient Have a Medical Advance Directive?: Yes, Type of Advance Directive: Healthcare Power of Attorney       Allergies  Allergen Reactions  . Penicillins Itching, Rash and Other (See Comments)    Tolerated unasyn, zosyn & cephalosporins in the past.  Has patient had a PCN reaction causing immediate rash, facial/tongue/throat swelling, SOB or lightheadedness with hypotension: Yes  Has patient had a PCN reaction causing severe rash involving mucus membranes or skin necrosis: Unk  Has patient had a PCN reaction that required hospitalization: Unk  Has patient had a PCN reaction occurring within the last 10 years: Unk  If all of the above answers are "NO", then may proceed with Cephalosporins       Chief Complaint  Patient presents with  . Transitions Of Care    Pt is had recent stay at [facility] 07/28/16 to 08/01/16 for diabetic ulcer of left foot.    HPI: Patient is a 83 y.o. female seen in the office today to follow up hospitalization  Pt was hospitalized due to gangrene and osteomyelitis of 2nd toe.  Did not wish to have an amputation due to her age and other co-morbidities reports she does not think she could go through it.  Has been prescribed Clindamycin 300 mg daily three times daily for 1 month. Pt was following with vascular prior to hospitalization. She has been also seeing podiatrist but they referred her to the wound care center which she will go tomorrow 08/08/16 in [place].  Per daughter Jeannie Done was 3 (something) by home health nurse but no longer has nursing coming out to home- now coumadin clinic  managing her INR- plans to call for follow up. Coumadin was adjusted.  Daughter reports her mom is having increase in pain to left leg and foot. Feels like this is her neuropathy, shooting stabbing pain.  She was previously taking gabapentin 2 tablets BID and 1 tablet in the afternoon but hospital decreased it on prior hospitalization and then at follow up visit it was increased to 1 tablet TID.  Elevating her leg helps the pain.  She was taken off her lasix. Daughter reports her leg has been more swollen and her weight is gradually increasing. Daughter states she is more short of breath, increase in wheezing and cough.  Blood sugars have been good throughout the day; was elevated prior to hospitalization, now it has improved.  Having diarrhea since she has been home. Bottom is sore from cleaning her self. No fevers. Denies foul odor or mucous.  Review of Systems:  Review of Systems  Constitutional: Negative for activity change, fatigue and unexpected weight change.  HENT: Negative for congestion.  Eyes: Negative.  Respiratory: Negative for cough and shortness of breath.  Cardiovascular: Positive for leg swelling. Negative for chest pain and palpitations.  Gastrointestinal: Positive for diarrhea. Negative for abdominal pain and constipation.  Genitourinary: Negative for difficulty urinating and dysuria.  Musculoskeletal: Positive for neck pain. Negative for arthralgias and myalgias.  Skin: Positive for wound. Negative for color change.  Neurological: Positive for weakness. Negative for dizziness.  Psychiatric/Behavioral: Negative for agitation, behavioral problems and confusion.  Past Medical History:  Diagnosis Date  . Anemia    previous blood transfusions  . Arthritis    "all over"  . Asthma   . Bradycardia    requiring previous d/c of BB and reduction of amiodarone  . CAD (coronary artery disease)    nonobstructive per notes  . Chronic diastolic CHF (congestive heart failure)  ([place])   . CKD (chronic kidney disease), stage III   . Complication of blood transfusion    "got the wrong blood type at [facility] in ~ 2015; no adverse reaction that we are aware of"/daughter, [name] (01/27/2016)  . COPD (chronic obstructive pulmonary disease) ([place])   . Depression    "light case"  . DVT (deep venous thrombosis) ([place]) 01/2016   a. LLE DVT 01/2016 - switched from Eliquis to Coumadin.  . Gastric stenosis    a. s/p stomach tube  . GERD (gastroesophageal reflux disease)   . History of blood transfusion    "several" (01/27/2016)  . History of stomach ulcers   . Hyperlipidemia   . Hypertension   . Hypothyroidism   . Paraesophageal hernia   . Perforated gastric ulcer ([place])   . Seasonal allergies   . SIADH (syndrome of inappropriate ADH production) ([place])    [name] 01/10/2015  . Small bowel obstruction ([place])    "I don't know how many" (01/11/2015)  . Stroke ([place])    "light one"  . Type II diabetes mellitus ([place])    "related to prednisone use for > 20 yr; once predinose stopped; no more DM RX" (01/27/2016)  . Ventral hernia with bowel obstruction         Past Surgical History:  Procedure Laterality Date  . CATARACT EXTRACTION W/ INTRAOCULAR LENS IMPLANT, BILATERAL    . CHOLECYSTECTOMY OPEN    . COLECTOMY    . ESOPHAGOGASTRODUODENOSCOPY N/A 01/19/2014   Procedure: ESOPHAGOGASTRODUODENOSCOPY (EGD); Surgeon: Irene Shipper, MD; Location: [facility]; Service: Endoscopy; Laterality: N/A;  . ESOPHAGOGASTRODUODENOSCOPY N/A 01/20/2014   Procedure: ESOPHAGOGASTRODUODENOSCOPY (EGD); Surgeon: Irene Shipper, MD; Location: [facility]; Service: Endoscopy; Laterality: N/A;  . ESOPHAGOGASTRODUODENOSCOPY N/A 03/19/2014   Procedure: ESOPHAGOGASTRODUODENOSCOPY (EGD); Surgeon: Milus Banister, MD; Location: [facility]; Service: Endoscopy; Laterality: N/A;  . ESOPHAGOGASTRODUODENOSCOPY N/A 07/08/2015   Procedure: ESOPHAGOGASTRODUODENOSCOPY (EGD); Surgeon: Doran Stabler, MD; Location:  [facility] ENDOSCOPY; Service: Endoscopy; Laterality: N/A;  . ESOPHAGOGASTRODUODENOSCOPY (EGD) WITH PROPOFOL N/A 09/15/2015   Procedure: ESOPHAGOGASTRODUODENOSCOPY (EGD) WITH PROPOFOL; Surgeon: Manus Gunning, MD; Location: [facility] ENDOSCOPY; Service: Gastroenterology; Laterality: N/A;  . GASTROJEJUNOSTOMY     hx/notes 01/10/2015  . GASTROJEJUNOSTOMY N/A 09/23/2015   Procedure: OPEN GASTROJEJUNOSTOMY TUBE PLACEMENT; Surgeon: Arta Bruce Kinsinger, MD; Location: [facility] ORS; Service: General; Laterality: N/A;  . GLAUCOMA SURGERY Bilateral   . HERNIA REPAIR  2015  . IR GENERIC HISTORICAL  01/07/2016   IR GJ TUBE CHANGE 01/07/2016 Jacqulynn Cadet, MD [facility]  . IR GENERIC HISTORICAL  01/27/2016   IR MECH REMOV OBSTRUC MAT ANY COLON TUBE W/FLUORO 01/27/2016 Markus Daft, MD [facility]  . IR GENERIC HISTORICAL  02/07/2016   IR PATIENT EVAL TECH 0-60 MINS Darrell K Allred, PA-C [facility]  . IR GENERIC HISTORICAL  02/08/2016   IR GJ TUBE CHANGE 02/08/2016 Greggory Keen, MD [facility]  . IR GENERIC HISTORICAL  01/06/2016   IR GJ TUBE CHANGE 01/06/2016 [facility]-RAD OUT REF  . IR GENERIC HISTORICAL  05/02/2016   IR CM INJ ANY COLONIC TUBE W/FLUORO 05/02/2016 Arne Cleveland, MD [facility]  .  IR GENERIC HISTORICAL  05/15/2016   IR GJ TUBE CHANGE 05/15/2016 Sandi Mariscal, MD [facility]  . IR GENERIC HISTORICAL  06/28/2016   IR GJ TUBE CHANGE 06/28/2016 [facility]  . LAPAROTOMY N/A 01/20/2015   Procedure: EXPLORATORY LAPAROTOMY; Surgeon: Coralie Keens, MD; Location: [facility]; Service: General; Laterality: N/A;  . LYSIS OF ADHESION N/A 01/20/2015   Procedure: LYSIS OF ADHESIONS < 1 HOUR; Surgeon: Coralie Keens, MD; Location: [facility]; Service: General; Laterality: N/A;  . TONSILLECTOMY    . TUBAL LIGATION    . VENTRAL HERNIA REPAIR  2015   incarcerated ventral hernia ([facility] 09/2013)/notes 01/10/2015   Social History:  reports that she has never smoked. She has quit using smokeless tobacco. Her smokeless tobacco  use included Snuff. She reports that she does not drink alcohol or use drugs.       Family History  Problem Relation Age of Onset  . Stroke Mother   . Hypertension Mother   . Diabetes Brother   . Heart attack Neg Hx    Medications:      Patient's Medications  New Prescriptions   No medications on file  Previous Medications   AMINO ACIDS-PROTEIN HYDROLYS PO Take 30 mLs by mouth 2 (two) times daily.   AMLODIPINE (NORVASC) 10 MG TABLET TAKE 1 TABLET BY MOUTH DAILY   ATORVASTATIN (LIPITOR) 10 MG TABLET TAKE ONE TABLET BY MOUTH ONCE DAILY   B COMPLEX VITAMINS TABLET Take 1 tablet by mouth daily.    BISACODYL (DULCOLAX) 10 MG SUPPOSITORY Place 1 suppository (10 mg total) rectally daily as needed for moderate constipation.   CALCIUM-VITAMIN D (OSCAL WITH D) 500-200 MG-UNIT PER TABLET Take 1 tablet by mouth daily with breakfast.    CHLORHEXIDINE (PERIDEX) 0.12 % SOLUTION Use as directed 15 mLs in the mouth or throat 2 (two) times daily.   CHLORPHEN-PE-ACETAMINOPHEN (SINUS CONGESTION/PAIN DAY/NGHT PO) Take 1 capsule by mouth daily as needed (for sinus irritation).    CLINDAMYCIN (CLEOCIN) 300 MG CAPSULE Take 1 capsule (300 mg total) by mouth every 8 (eight) hours.   FEEDING SUPPLEMENT, GLUCERNA SHAKE, (GLUCERNA SHAKE) LIQD Take 237 mLs by mouth daily.    FLUTICASONE (FLONASE) 50 MCG/ACT NASAL SPRAY Place 2 sprays into both nostrils daily.   GABAPENTIN (NEURONTIN) 300 MG CAPSULE Take 300 mg by mouth 3 (three) times daily.    INSULIN GLARGINE (LANTUS SOLOSTAR) 100 UNIT/ML SOLOSTAR PEN Inject 5 Units into the skin daily at 10 pm.   IPRATROPIUM-ALBUTEROL (DUONEB) 0.5-2.5 (3) MG/3ML SOLN Take 3 mLs by nebulization every 6 (six) hours as needed (sob). Use 3 times daily x 4 days then every 6 hours as needed.   ISOSORBIDE MONONITRATE (IMDUR) 30 MG 24 HR TABLET Take 30 mg by mouth daily.   LATANOPROST (XALATAN) 0.005 % OPHTHALMIC SOLUTION Place 1 drop into both eyes at bedtime.   LEVALBUTEROL (XOPENEX  HFA) 45 MCG/ACT INHALER Inhale 2 puffs into the lungs every 6 (six) hours as needed for wheezing or shortness of breath.   LEVOTHYROXINE (SYNTHROID, LEVOTHROID) 137 MCG TABLET TAKE 1 TABLET (137 MCG TOTAL) BY MOUTH DAILY BEFORE BREAKFAST.   LOSARTAN (COZAAR) 50 MG TABLET Take 50 mg by mouth daily.   MALTODEXTRIN-XANTHAN GUM (RESOURCE THICKENUP CLEAR) POWD Take 120 g by mouth as needed (use to thicken liquids.).   MULTIPLE VITAMIN (MULTIVITAMIN WITH MINERALS) TABS TABLET Take 1 tablet by mouth daily.   NUTRITIONAL SUPPLEMENTS (FEEDING SUPPLEMENT, OSMOLITE 1.5 CAL,) LIQD Place 1,000 mLs into feeding tube daily.   POLYETHYL  GLYCOL-PROPYL GLYCOL 0.4-0.3 % SOLN Place 1 drop into both eyes 4 (four) times daily.    POLYETHYLENE GLYCOL (MIRALAX / GLYCOLAX) PACKET Take 17 g by mouth daily as needed for mild constipation.   RESTASIS 0.05 % OPHTHALMIC EMULSION USE 1 DROP INTO BOTH EYES TWICE DAILY   SERTRALINE (ZOLOFT) 100 MG TABLET TAKE ONE TABLET BY MOUTH ONCE DAILY FOR DEPRESSION   TRAMADOL (ULTRAM) 50 MG TABLET Take 50 mg by mouth See admin instructions. Every 4-6 hours as needed for pain   WARFARIN (COUMADIN) 2.5 MG TABLET TAKE AS DIRECTED BY COUMADIN CLINIC.   WATER FOR IRRIGATION, STERILE (FREE WATER) SOLN Place 230 mLs into feeding tube 3 (three) times daily.  Modified Medications   No medications on file  Discontinued Medications   No medications on file   Physical Exam:     Vitals:   08/07/16 1312  BP: 134/64  Pulse: 69  Resp: 18  Temp: 97.8 F (36.6 C)  TempSrc: Oral  SpO2: 99%  Weight: 151 lb 6.4 oz (68.7 kg)  Height: '5\' 6"'  (1.676 m)   Body mass index is 24.44 kg/m.  Physical Exam  Constitutional: She is oriented to person, place, and time. She appears well-developed and well-nourished. No distress.  HENT:  Head: Normocephalic and atraumatic.  Mouth/Throat: Oropharynx is clear and moist. No oropharyngeal exudate.  Eyes: Conjunctivae are normal. Pupils are equal, round, and  reactive to light.  Neck: Normal range of motion. Neck supple.  Cardiovascular: Normal rate, regular rhythm and normal heart sounds.  Pulmonary/Chest: Effort normal and breath sounds normal.  Abdominal: Soft. Bowel sounds are normal.  Musculoskeletal: She exhibits edema. She exhibits no tenderness.  Neurological: She is alert and oriented to person, place, and time.  Skin: Skin is warm and dry. She is not diaphoretic.  the patient has a black left second toe. There is open wound at the base of the second toe. Scant serous drainage noted at base of toe  Psychiatric: She has a normal mood and affect.  Labs reviewed:  Basic Metabolic Panel:   Recent Labs (within last 365 days)  Recent Labs    09/16/15 0322  05/08/16 1807  06/08/16 1523  06/08/16 2359 06/10/16 0317  07/18/16 1627 07/21/16 1124 07/28/16 2039 07/29/16 0041 08/01/16 0528  NA 137 < > --  < > 135 < > 138 134* < > 134* --  136 135 131*  K 3.5 < > --  < > 4.4 < > 3.9 3.5 < > 4.6 --  4.2 4.5 4.7  CL 110 < > --  < > 99* < > 103 100* < > 96* --  102 102 100*  CO2 22 < > --  < > 29 --  26 24 < > 26 --  --  23 24  GLUCOSE 83 < > --  < > 148* < > 191* 142* < > 111* --  101* 89 153*  BUN 7 < > --  < > 54* < > 49* 52* < > 36* --  43* 42* 29*  CREATININE 1.01* < > --  < > 1.97* < > 1.60* 1.30* < > 1.31* --  1.30* 1.15* 1.01*  CALCIUM 8.0* < > --  < > 8.7* --  9.3 8.7* < > 9.7 --  --  9.8 9.3  MG 1.7 < > --  < > 1.9 --  1.9 2.1 --  --  --  --  --  --   PHOS 1.8* --  --  --  --  --  3.3 3.3 --  --  --  --  --  --   TSH --  < > 7.468* --  10.017* --  --  --  --  --  4.97* --  --  --   < > = values in this interval not displayed.   Liver Function Tests:   Recent Labs (within last 365 days)  Recent Labs     05/28/16 0519 06/08/16 1523 06/18/16 1607   AST 32 25 21   ALT 52 24 25   ALKPHOS 72 53 71   BILITOT 0.3 0.6 0.4   PROT 6.1* 5.4* 6.6   ALBUMIN 2.8* 2.8* 3.1*     Recent Labs (within last 365 days)  Recent Labs      08/26/15 0630 09/11/15 2351 11/06/15 1612   LIPASE 18 81* 21    Recent Labs (within last 365 days)  No results for input(s): AMMONIA in the last 8760 hours.   CBC:   Recent Labs (within last 365 days)  Recent Labs    06/18/16 1607  07/18/16 1627 07/28/16 2032  07/29/16 0041 07/31/16 0452 08/01/16 0528  WBC 7.5 < > 14.7* 9.1 --  11.5* 8.6 8.6  NEUTROABS 4.3 --  11.0* 6.4 --  --  --  --   HGB 10.4* --  9.4* 8.9* < > 7.9* 8.9* 10.2*  HCT 31.0* < > 27.3* 26.0* < > 23.4* 26.9* 30.7*  MCV 85.2 < > 85.8 85.5 --  86.0 86.2 86.5  PLT 238 < > 316 530* --  591* 526* 577*  < > = values in this interval not displayed.   Lipid Panel:  Recent Labs (within last 365 days)  No results for input(s): CHOL, HDL, LDLCALC, TRIG, CHOLHDL, LDLDIRECT in the last 8760 hours.   TSH:   Recent Labs (within last 365 days)                  A1C:  Recent Labs                       Mr Foot Left Wo Contrast  Result Date: 07/29/2016  CLINICAL DATA: Diabetic left foot wound. Second toe appears block. EXAM: MRI OF THE LEFT FOOT WITHOUT CONTRAST TECHNIQUE: Multiplanar, multisequence MR imaging of the left forefoot was performed. No intravenous contrast was administered. COMPARISON: None. FINDINGS: Bones/Joint/Cartilage Marrow signal abnormality consistent with bone marrow edema is identified involving the base of the second proximal phalanx. This overlies an area of plantar soft tissue cellulitis with ulceration. Despite lack of IV contrast there is suspicion for a subcutaneous soft tissue abscess along the plantar aspect of the left second toe measuring 6 x 3 x 7 mm characterized by irregular hypointensity within the area of inflamed soft tissue. Similar plantar soft tissue edema and/or cellulitis is seen along the plantar aspect of the fourth and fifth digits. Hallux valgus with osteoarthritic joint space narrowing is noted of the great toe. DIP and PIP joint space narrowing as well as interphalangeal  joint space narrowing of the great toe are identified. Ligaments Noncontributory Muscles and Tendons No intramuscular hemorrhage, hematoma or atrophy. The extensor and flexor tendons of the forefoot are grossly intact without sickle-cell abnormalities noted. Soft tissues Diffuse soft tissue swelling/cellulitis of the forefoot especially along the dorsum. IMPRESSION: 1. Marrow signal abnormality involving the base of the second proximal phalanx overlying an area of soft tissue ulceration, inflammation and possible tiny subcutaneous abscess measuring 6 x 3 x  7 mm. This raises concern for osteomyelitis though specificity is somewhat limited by lack of IV contrast. 2. Plantar soft tissue inflammation also noted along the base of the fourth and fifth digits without adjacent marrow signal abnormalities. 3. Hallux valgus with osteoarthritic joint space narrowing of the great toe. Lesser degree of the joint space narrowing is identified of the PIP and DIP joints of the second through fifth digits. Electronically Signed By: Ashley Royalty M.D. On: 07/29/2016 01:16  Dg Foot Complete Left  Result Date: 07/28/2016  CLINICAL DATA: 83 y/o F; increasing foot pain in the plantar and anterior site of the foot. EXAM: LEFT FOOT - COMPLETE 3+ VIEW COMPARISON: 08/26/2014 foot radiographs FINDINGS: Bones are demineralized. Hallux valgus. Lisfranc alignment is maintained. Pes planus. No acute fracture identified. Plantar calcaneal enthesophyte. Vascular calcifications. IMPRESSION: No acute fracture identified. Hallux valgus. Pes planus. Plantar calcaneal enthesophyte. Electronically Signed By: Kristine Garbe M.D. On: 07/28/2016 21:10  Assessment/Plan  1. Chronic diastolic CHF (congestive heart failure) (HCC)  -increased edema to LE, off lasix at this time. Will restart and follow up labs, to cont lisinopril, not on betablocker due to bradycarda  - BMP with eGFR  - furosemide (LASIX) 20 MG tablet; Take 1 tablet (20 mg total) by  mouth daily. Dispense: 30 tablet; Refill: 3  2. Essential hypertension, benign  Stable at this time  3. Diabetic ulcer of toe of right foot associated with type 2 diabetes mellitus, with necrosis of muscle (HCC)  -will cont current lantus dose  4. Diabetic polyneuropathy associated with type 2 diabetes mellitus (HCC)  -will increase gabapentin due to increase in pain, to monitor for increase in lethargy  - gabapentin (NEURONTIN) 300 MG capsule; 1 tablet by mouth three times daily, may have additional dose every 8 hours as needed pain  5. Diarrhea, unspecified type  -to use probiotic twice daily  - CBC with Differential/Platelets, to monitor for elevated wbc for possible c diff due to antibiotic use  6. Osteomyelitis of foot, acute (HCC)  Cont clindamycin and follow up with ID 7. Gangrene of toe of left foot ([place])  -cont would care per hospitalization and follow up with wound care clinic  Discussed palliative care with pt and daughter however they are not ready for palliative care at this point.  [name]. Harle Battiest  [facility]  [phone] 8 am - 5 pm)  [phone] (after hours)

## 2016-08-16 ENCOUNTER — Inpatient Hospital Stay (HOSPITAL_COMMUNITY): Payer: Medicare Other

## 2016-08-16 DIAGNOSIS — I5033 Acute on chronic diastolic (congestive) heart failure: Secondary | ICD-10-CM

## 2016-08-16 DIAGNOSIS — Z931 Gastrostomy status: Secondary | ICD-10-CM

## 2016-08-16 DIAGNOSIS — I96 Gangrene, not elsewhere classified: Secondary | ICD-10-CM

## 2016-08-16 DIAGNOSIS — Z794 Long term (current) use of insulin: Secondary | ICD-10-CM

## 2016-08-16 DIAGNOSIS — J690 Pneumonitis due to inhalation of food and vomit: Secondary | ICD-10-CM

## 2016-08-16 DIAGNOSIS — E119 Type 2 diabetes mellitus without complications: Secondary | ICD-10-CM

## 2016-08-16 LAB — CBC WITH DIFFERENTIAL/PLATELET
Basophils Absolute: 0 10*3/uL (ref 0.0–0.1)
Basophils Relative: 0 %
Eosinophils Absolute: 0 10*3/uL (ref 0.0–0.7)
Eosinophils Relative: 0 %
HCT: 28.2 % — ABNORMAL LOW (ref 36.0–46.0)
Hemoglobin: 9.1 g/dL — ABNORMAL LOW (ref 12.0–15.0)
Lymphocytes Relative: 8 %
Lymphs Abs: 0.9 10*3/uL (ref 0.7–4.0)
MCH: 28.7 pg (ref 26.0–34.0)
MCHC: 32.3 g/dL (ref 30.0–36.0)
MCV: 89 fL (ref 78.0–100.0)
Monocytes Absolute: 0.2 10*3/uL (ref 0.1–1.0)
Monocytes Relative: 2 %
Neutro Abs: 9.7 10*3/uL — ABNORMAL HIGH (ref 1.7–7.7)
Neutrophils Relative %: 90 %
Platelets: 351 10*3/uL (ref 150–400)
RBC: 3.17 MIL/uL — ABNORMAL LOW (ref 3.87–5.11)
RDW: 18.1 % — ABNORMAL HIGH (ref 11.5–15.5)
WBC: 10.8 10*3/uL — ABNORMAL HIGH (ref 4.0–10.5)

## 2016-08-16 LAB — GLUCOSE, CAPILLARY
Glucose-Capillary: 330 mg/dL — ABNORMAL HIGH (ref 65–99)
Glucose-Capillary: 304 mg/dL — ABNORMAL HIGH (ref 65–99)
Glucose-Capillary: 327 mg/dL — ABNORMAL HIGH (ref 65–99)
Glucose-Capillary: 189 mg/dL — ABNORMAL HIGH (ref 65–99)
Glucose-Capillary: 252 mg/dL — ABNORMAL HIGH (ref 65–99)

## 2016-08-16 LAB — COMPREHENSIVE METABOLIC PANEL
ALT: 39 U/L (ref 14–54)
AST: 29 U/L (ref 15–41)
Albumin: 3 g/dL — ABNORMAL LOW (ref 3.5–5.0)
Alkaline Phosphatase: 61 U/L (ref 38–126)
Anion gap: 9 (ref 5–15)
BUN: 54 mg/dL — ABNORMAL HIGH (ref 6–20)
CO2: 25 mmol/L (ref 22–32)
Calcium: 9.4 mg/dL (ref 8.9–10.3)
Chloride: 107 mmol/L (ref 101–111)
Creatinine, Ser: 1.04 mg/dL — ABNORMAL HIGH (ref 0.44–1.00)
GFR calc Af Amer: 53 mL/min — ABNORMAL LOW (ref >60)
GFR calc non Af Amer: 46 mL/min — ABNORMAL LOW (ref >60)
Glucose, Bld: 350 mg/dL — ABNORMAL HIGH (ref 65–99)
Potassium: 4.2 mmol/L (ref 3.5–5.1)
Sodium: 141 mmol/L (ref 135–145)
Total Bilirubin: 0.2 mg/dL — ABNORMAL LOW (ref 0.3–1.2)
Total Protein: 7.7 g/dL (ref 6.5–8.1)

## 2016-08-16 LAB — MAGNESIUM: Magnesium: 1.9 mg/dL (ref 1.7–2.4)

## 2016-08-16 LAB — PROTIME-INR
INR: 2.57
Prothrombin Time: 28.1 seconds — ABNORMAL HIGH (ref 11.4–15.2)

## 2016-08-16 LAB — PHOSPHORUS: Phosphorus: 2.2 mg/dL — ABNORMAL LOW (ref 2.5–4.6)

## 2016-08-16 MED ORDER — AMOXICILLIN-POT CLAVULANATE 875-125 MG PO TABS
1.0000 | ORAL_TABLET | Freq: Two times a day (BID) | ORAL | Status: DC
  Administered 2016-08-16 – 2016-08-18 (×4): 1 via ORAL
  Filled 2016-08-16 (×4): qty 1

## 2016-08-16 MED ORDER — NEPRO/CARBSTEADY PO LIQD
237.0000 mL | Freq: Two times a day (BID) | ORAL | Status: DC
  Administered 2016-08-16 – 2016-08-18 (×5): 237 mL via ORAL
  Filled 2016-08-16 (×5): qty 237

## 2016-08-16 MED ORDER — SILVER SULFADIAZINE 1 % EX CREA
TOPICAL_CREAM | Freq: Every day | CUTANEOUS | Status: DC
  Administered 2016-08-16 – 2016-08-17 (×2): via TOPICAL
  Filled 2016-08-16: qty 50

## 2016-08-16 MED ORDER — GLUCERNA 1.2 CAL PO LIQD
1500.0000 mL | ORAL | Status: DC
  Administered 2016-08-16 – 2016-08-17 (×2): 1500 mL
  Filled 2016-08-16 (×3): qty 1500

## 2016-08-16 MED ORDER — WARFARIN SODIUM 1 MG PO TABS
1.0000 mg | ORAL_TABLET | Freq: Once | ORAL | Status: AC
  Administered 2016-08-16: 1 mg via ORAL
  Filled 2016-08-16 (×2): qty 1

## 2016-08-16 MED ORDER — PREDNISONE 20 MG PO TABS
40.0000 mg | ORAL_TABLET | Freq: Every day | ORAL | Status: AC
  Administered 2016-08-17: 40 mg via ORAL
  Filled 2016-08-16: qty 2

## 2016-08-16 MED ORDER — SYSTANE NIGHTTIME OP OINT
1.0000 "application " | TOPICAL_OINTMENT | Freq: Every day | OPHTHALMIC | Status: DC
  Administered 2016-08-16 – 2016-08-17 (×2): 1 via OPHTHALMIC

## 2016-08-16 NOTE — Progress Notes (Addendum)
Physical Therapy Treatment Patient Details Name: Shannon [name] MRN: [id number] DOB: [birth year] Today's Date: 08/16/2016    History of Present Illness 83 yo female with PMH of hypothyroidism, symptomatic bradycardia (she declined PPM), CKD 3, GERD, COPD, and DVT with recent admission  for evaluation of progressive diabetic foot ulcer and gangrene of her second digit on the left toe. Pt now admitted with DOE, cough, wheezing.     PT Comments    Progressing slowly with mobility. Pt fatigues easily with ambulation. O2 sats >90%, HR in 130s bpm during ambulation.    Follow Up Recommendations  Home health PT     Equipment Recommendations  None recommended by PT    Recommendations for Other Services       Precautions / Restrictions Precautions Precautions: Fall Restrictions Weight Bearing Restrictions: No    Mobility  Bed Mobility Overal bed mobility: Modified Independent                Transfers Overall transfer level: Needs assistance Equipment used: Rolling walker (2 wheeled) Transfers: Sit to/from [employer] Sit to Stand: Min guard Stand pivot transfers: Min guard       General transfer comment: Close guard for safety. Increased time. VCs safety  Ambulation/Gait Ambulation/Gait assistance: Min guard Ambulation Distance (Feet): 55 Feet Assistive device: Rolling walker (2 wheeled) Gait Pattern/deviations: Step-through pattern;Decreased stride length;Trunk flexed     General Gait Details: Pt purposely keeping weight thru L heel to stay off toes. Dyspnea 2/4. O2 sats >90% on RA, HR 130s bpm during ambulation.Very slow pace.    Stairs            Wheelchair Mobility    Modified Rankin (Stroke Patients Only)       Balance                                            Cognition Arousal/Alertness: Awake/alert Behavior During Therapy: WFL for tasks assessed/performed Overall Cognitive Status: Within Functional Limits  for tasks assessed                                        Exercises      General Comments        Pertinent Vitals/Pain Pain Assessment: Faces Faces Pain Scale: Hurts little more Pain Location: L heel Pain Descriptors / Indicators: Sore Pain Intervention(s): Limited activity within patient's tolerance;Repositioned    Home Living                      Prior Function            PT Goals (current goals can now be found in the care plan section) Progress towards PT goals: Progressing toward goals (slowly)    Frequency           PT Plan Current plan remains appropriate    Co-evaluation              AM-PAC PT "6 Clicks" Daily Activity  Outcome Measure  Difficulty turning over in bed (including adjusting bedclothes, sheets and blankets)?: None Difficulty moving from lying on back to sitting on the side of the bed? : None Difficulty sitting down on and standing up from a chair with arms (e.g., wheelchair, bedside commode, etc,.)?: A Little Help needed  moving to and from a bed to chair (including a wheelchair)?: A Little Help needed walking in hospital room?: A Little Help needed climbing 3-5 steps with a railing? : A Little 6 Click Score: 20    End of Session Equipment Utilized During Treatment: Gait belt Activity Tolerance: Patient limited by fatigue Patient left: in chair;with call bell/phone within reach;with family/visitor present;with chair alarm set   PT Visit Diagnosis: Muscle weakness (generalized) (M62.81);Difficulty in walking, not elsewhere classified (R26.2)     Time: [id number] PT Time Calculation (min) (ACUTE ONLY): 26 min  Charges:  $Gait Training: 8-22 mins $Therapeutic Activity: 8-22 mins                    G Codes:          Rebeca Alert, MPT Pager: [phone]

## 2016-08-16 NOTE — Progress Notes (Signed)
PROGRESS NOTE    Shannon [name]  ZOX:[id number] DOB: Dec 06, 1923 DOA: 08/12/2016 PCP: Kirt Boys, DO  Brief Narrative:  Shannon [name] is a 83 y.o. female with known COPD, CAD, HTN, CKD stage III, dementia, presented to [facility] ED with main concern of several days duration of progressively worsening dyspnea, initially present with exertion but has progressed to dyspnea at rest for the past 24 hours, associated with wheezing, non productive cough, chest tightness that occurs with coughing spells, poor oral intake. In the ED the paient initially had significant dyspnea but improved with nebulizer treatments and solumedrol. Currently VSS, TRH asked to admit for further evaluation for COPD Exacerbation. Patient is breathing a little better but complains of SOB when coughing.   Assessment & Plan:   Active Problems:   Essential hypertension, benign   Dyslipidemia   Diabetes mellitus type 2 in nonobese (HCC)   Hypothyroidism   Depression   Chronic kidney insufficiency, stage 3 (moderate)   SOB (shortness of breath)   PEG (percutaneous endoscopic gastrostomy) status (HCC)   COPD (chronic obstructive pulmonary disease) (HCC)   Hypokalemia  Acute Exacerbation of COPD (baseline not o2 dependent, not steroid dependent)  with Hx of Asthma, recurrent aspiration pneumonia -she presented with wheezing, cough, dyspnea, respiratory viral panel negative, she dose not have productive cough -she is treated with IV Azithromycin for COPD since admission, will change to oral augmentin to cover aspiration pna ( though pcn listed at allergy, she has been on amoxicillin and augmentin repeatedly in the past), continue guaifenesin, nebs, steroids --cxr  On 5/14 showed Mediastinum hilar structures are normal. Cardiomegaly. Low lung volumes with bibasilar atelectasis. Left base infiltrate cannot be excluded. Tiny left pleural effusion cannot be excluded. No pneumothorax. -cxr on 5/16 1. Low lung volumes with interim improvement  of aeration of left lung base. No infiltrate noted. 2. Stable cardiomegaly.  No pulmonary venous congestion. 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 -Last HbA1c on 05/30/16 was 8.; Repeat HbA1c this visit pending -Restart Home Lantus 5 units sq Daily and will increase to 8 units sq Daily per Diabetic Education Coordinator Recc -C/w Home Gabapentin Regimen  -change tube feed to glucerna, taper steroids  HTN -BP was 156/78 -Continue home medical regimen with Amlodipine 10 mg po Daily, Furosemide 20 mg Daily, Imdur 30 mg po Daily, and with Losartan 50 mg po Daily -Has Hydralazine 5 mg IV q4hprn for SBP >155 when needed  Hypokalemia -Patient's K+ went from 3.8 -> 3.3 -Replete with KCl Liquid 40 mEQ BID x 2 doses -Repeat CMP in AM, k 4.2  CKD stage III -Cr is at baseline   Gastric Stenosis/Outlet Obstruction s/p PEG --SLP to Evaluate and recommended Nectar Thick Liquids -Gets Tube Feedings at Night and Dietician Consulted for help in management of Nightly Tube Feedings  -discontinue Osmolite 1000 mL due to hyperglycemia, change to glucerna tube feeds, C/w Nepro 237 mL po q24h, Prostat 30 mL per tube BID  Hx of DVT - Continue Coumadin per Pharmacy Dosing   Chronic Normocytic Anemia -Patient's Hb/Hct went from 9.0/27.0 -> 8.9/27.1 -No S/Sx of Bleeding -Repeat CBC  in AM   Hyperlipidemia -C/w Atorvastatin 10 mg po Daily  Hypothyroidism -C/w Levothyroxine 137 mcg po Daily  Depression -C/w Sertraline 50 mg po Daily  Left Foot Second Toe Necrosis -WOC  Nurse Consultation appreciated; Patient would like to see WOC nurse again today.  -she is discharged on Clindamycin 300 mg po q8h for 30days start from 4/27, now on augmentin -, per patient she does not want to have surgery , daughter is requesting palliative care   DVT prophylaxis: Anticoagulated with Coumadin  Code Status: DO NOT RESUSCITATE Family Communication: Discussed with Daugther at bedside Disposition Plan: Home Health PT , pending palliative care consult  Consultants:   Palliative care   Procedures: None   Antimicrobials:  Anti-infectives    Start     Dose/Rate Route Frequency Ordered Stop   08/14/16 1100  clindamycin (CLEOCIN) capsule 300 mg     300 mg Oral Every 8 hours 08/14/16 1009     08/13/16 1000  azithromycin (ZITHROMAX) 500 mg in dextrose 5 % 250 mL IVPB     500 mg 250 mL/hr over 60 Minutes Intravenous Every 24 hours 08/13/16 0943       Subjective: Report feeling better, States she is concerned about her blood sugar being high. No Nausea or vomiting Patient denies foot pain, she request to have silvadene to put on her toe, she also request her eyedrops Daughter at bedside    Objective: Vitals:   08/15/16 1900 08/15/16 2034 08/16/16 0416 08/16/16 1024  BP:  (!) 155/63 (!) 157/65   Pulse:  97 98 (!) 125  Resp:  18 18 20   Temp:  98 F (36.7 C) 97.9 F (36.6 C)   TempSrc:  Oral Oral   SpO2: 99% 100% 98% 99%  Weight:   65.4 kg (144 lb 2.9 oz)   Height:        Intake/Output Summary (Last 24 hours) at 08/16/16 1622 Last data filed at 08/16/16 1300  Gross per 24 hour  Intake          1158.75 ml  Output                0 ml  Net          1158.75 ml   Filed Weights   08/14/16 0539 08/15/16 0435 08/16/16 0416  Weight: 68.1 kg (150 lb 1.6 oz) 65.4 kg (144 lb 2.9 oz) 65.4 kg (144 lb 2.9 oz)   Examination: Physical Exam:  Constitutional: Very pleasant 83 yo AAF , frail but NAD sitting bedside Eyes: Sclerae anicteric. Conjuntivae non-injected. Lids  Normal ENMT:  External Ears and Nose appear normal. Mucous membranes appear moist. Patient has grossly normal hearing. Neck: No JVD or evidence of thyromegaly Respiratory: Diminished on left side with crackles , previously documented wheezing seems has resolved. Patient not tachypenic or using any accessory muscles to breath. Cardiovascular: RRR S1, S2, no lower extremity edema Abdomen: Soft, NT, ND. Bowel sounds present. PEG tube in place without erythema GU: Deferred Musculoskeletal: No contractures. Good ROM. Skin:. Left 2nd to necrotic. Warm and dry. No rashes Neurologic:  CN 2-12 grossly intact. No focal deficits. Psychiatric: Pleasant mood and affect. Intact Judgement and insight. Awake and alert.   Data Reviewed: I have personally reviewed following labs and imaging studies  CBC:  Recent Labs Lab 08/12/16 1400 08/13/16 0520 08/14/16 0505 08/15/16 0545 08/16/16 0616  WBC 8.5 11.0* 16.6* 12.1* 10.8*  NEUTROABS  --   --  15.8* 11.0* 9.7*  HGB  8.5* 8.7* 9.0* 8.9* 9.1*  HCT 25.7* 26.3* 27.0* 27.1* 28.2*  MCV 88.0 88.3 87.4 88.3 89.0  PLT 369 373 371 348 351   Basic Metabolic Panel:  Recent Labs Lab 08/12/16 1400 08/13/16 0520 08/14/16 0505 08/15/16 0545 08/16/16 0616  NA 133* 136 138 136 141  K 4.5 4.7 3.8 3.3* 4.2  CL 98* 100* 101 102 107  CO2 26 25 25 23 25   GLUCOSE 186* 221* 238* 451* 350*  BUN 62* 63* 55* 64* 54*  CREATININE 1.03* 1.16* 1.07* 1.11* 1.04*  CALCIUM 9.3 10.0 9.8 9.3 9.4  MG  --   --  2.1 2.1 1.9  PHOS  --   --  3.7 2.4* 2.2*   GFR: Estimated Creatinine Clearance: 33 mL/min (A) (by C-G formula based on SCr of 1.04 mg/dL (H)). Liver Function Tests:  Recent Labs Lab 08/14/16 0505 08/15/16 0545 08/16/16 0616  AST 59* 51* 29  ALT 33 40 39  ALKPHOS 59 62 61  BILITOT 0.6 0.5 0.2*  PROT 8.1 7.8 7.7  ALBUMIN 3.2* 3.1* 3.0*   No results for input(s): LIPASE, AMYLASE in the last 168 hours. No results for input(s): AMMONIA in the last 168  hours. Coagulation Profile:  Recent Labs Lab 08/12/16 1429 08/13/16 0520 08/14/16 0505 08/15/16 0545 08/16/16 0616  INR 1.98 1.84 1.64 1.96 2.57   Cardiac Enzymes: No results for input(s): CKTOTAL, CKMB, CKMBINDEX, TROPONINI in the last 168 hours. BNP (last 3 results)  Recent Labs  05/08/16 1053  PROBNP 1,132*   HbA1C: No results for input(s): HGBA1C in the last 72 hours. CBG:  Recent Labs Lab 08/15/16 2336 08/16/16 0414 08/16/16 0734 08/16/16 1219 08/16/16 1618  GLUCAP 266* 330* 304* 327* 189*   Lipid Profile: No results for input(s): CHOL, HDL, LDLCALC, TRIG, CHOLHDL, LDLDIRECT in the last 72 hours. Thyroid Function Tests: No results for input(s): TSH, T4TOTAL, FREET4, T3FREE, THYROIDAB in the last 72 hours. Anemia Panel: No results for input(s): VITAMINB12, FOLATE, FERRITIN, TIBC, IRON, RETICCTPCT in the last 72 hours. Sepsis Labs: No results for input(s): PROCALCITON, LATICACIDVEN in the last 168 hours.  Recent Results (from the past 240 hour(s))  Respiratory Panel by PCR     Status: None   Collection Time: 08/13/16 11:37 AM  Result Value Ref Range Status   Adenovirus NOT DETECTED NOT DETECTED Final   Coronavirus 229E NOT DETECTED NOT DETECTED Final   Coronavirus HKU1 NOT DETECTED NOT DETECTED Final   Coronavirus NL63 NOT DETECTED NOT DETECTED Final   Coronavirus OC43 NOT DETECTED NOT DETECTED Final   Metapneumovirus NOT DETECTED NOT DETECTED Final   Rhinovirus / Enterovirus NOT DETECTED NOT DETECTED Final   Influenza A NOT DETECTED NOT DETECTED Final   Influenza B NOT DETECTED NOT DETECTED Final   Parainfluenza Virus 1 NOT DETECTED NOT DETECTED Final   Parainfluenza Virus 2 NOT DETECTED NOT DETECTED Final   Parainfluenza Virus 3 NOT DETECTED NOT DETECTED Final   Parainfluenza Virus 4 NOT DETECTED NOT DETECTED Final   Respiratory Syncytial Virus NOT DETECTED NOT DETECTED Final   Bordetella pertussis NOT DETECTED NOT DETECTED Final   Chlamydophila  pneumoniae NOT DETECTED NOT DETECTED Final   Mycoplasma pneumoniae NOT DETECTED NOT DETECTED Final    Comment: Performed at [facility], [address], [place], [place] [address]    Radiology Studies: Dg Chest Port 1 View  Result Date: 08/16/2016 CLINICAL DATA:  Shortness of breath. EXAM: PORTABLE CHEST 1 VIEW COMPARISON:  08/14/2016. FINDINGS: Mediastinum  hilar structures are normal. Stable cardiomegaly. Normal pulmonary vascularity. Low lung volumes, interval improvement of aeration of left lung base. No infiltrate noted. No pleural effusion or pneumothorax. Thoracic spine scoliosis and degenerative change IMPRESSION: 1. Low lung volumes with interim improvement of aeration of left lung base. No infiltrate noted. 2. Stable cardiomegaly.  No pulmonary venous congestion. Electronically Signed   By: Maisie Fus  Register   On: 08/16/2016 06:54   Scheduled Meds: . amLODipine  10 mg Oral Daily  . atorvastatin  10 mg Oral Daily  . clindamycin  300 mg Oral Q8H  . cycloSPORINE  1 drop Both Eyes BID  . feeding supplement (GLUCERNA 1.2 CAL)  1,500 mL Per Tube Q24H  . feeding supplement (NEPRO CARB STEADY)  237 mL Oral BID BM  . fluticasone  2 spray Each Nare Daily  . furosemide  20 mg Oral Daily  . gabapentin  300 mg Oral 2 times per day  . gabapentin  600 mg Oral Q breakfast  . guaiFENesin  1,200 mg Oral BID  . insulin aspart  0-15 Units Subcutaneous Q4H  . insulin glargine  8 Units Subcutaneous Q2200  . ipratropium-albuterol  3 mL Nebulization TID  . isosorbide mononitrate  30 mg Oral Daily  . latanoprost  1 drop Both Eyes QHS  . levothyroxine  137 mcg Oral QAC breakfast  . losartan  50 mg Oral Daily  . methylPREDNISolone (SOLU-MEDROL) injection  40 mg Intravenous Q12H  . polyvinyl alcohol  1 drop Both Eyes QID  . sertraline  50 mg Oral Daily  . silver sulfADIAZINE   Topical Daily  . sodium chloride flush  3 mL Intravenous Q12H  . SYSTANE NIGHTTIME  1 application Ophthalmic QHS  .  warfarin  1 mg Oral ONCE-1800  . Warfarin - Pharmacist Dosing Inpatient   Does not apply q1800   Continuous Infusions: . azithromycin Stopped (08/16/16 1158)    LOS: 4 days    Time spent>  Tyronne Blann, MD PhD [facility] Pager [phone]  If 7PM-7AM, please contact night-coverage [email or web address] Password [facility] 08/16/2016, 4:22 PM

## 2016-08-16 NOTE — Progress Notes (Signed)
Nutrition Follow-up  DOCUMENTATION CODES:   Severe malnutrition in context of chronic illness  INTERVENTION:  - Will change TF regimen: Glucerna 1.2 @ 55 mL/hr from 2100-0900 which will provide 792 kcal (53% minimum estimated kcal need), 40 grams of protein (50% minimum estimated protein need), 11 grams of fiber, and 531 mL free water.  - Will increase Nepro to BID, each supplement provides 420 kcal and 19 grams of protein.  - Continue to encourage PO intakes of meals, supplements. - RD will continue to monitor for needs.  NUTRITION DIAGNOSIS:   Malnutrition related to chronic illness, dysphagia (paraesophageal hernia) as evidenced by moderate depletion of body fat, severe depletion of muscle mass. -ongoing  GOAL:   Patient will meet greater than or equal to 90% of their needs -met   MONITOR:   PO intake, Supplement acceptance, Labs, Weight trends, TF tolerance, Skin, I & O's  REASON FOR ASSESSMENT:   Consult Enteral/tube feeding initiation and management  ASSESSMENT:   83 y.o. female with known COPD, CAD, HTN, CKD stage III, dementia, presented to [facility] ED with main concern of several days duration of progressively worsening dyspnea, initially present with exertion but has progressed to dyspnea at rest for the past 24 hours, associated with wheezing, non productive cough, chest tightness that occurs with coughing spells, poor oral intake., Please note pt is unable to provide much of the history due to dementia and family at bedside able to provide some information. In the ED the paient initially had significant dyspnea but improved with nebulizer treatments and solumedrol. Currently VSS, TRH asked to admit for further evaluation for COPD Exacerbation.  5/16 Consult received with concern for current TF formula given hx of Type 2 DM and CBGs. Weight -2.7 kg from 08/14/16. Pt with G-J and receiving Omsolite 1.5 @ 65 mL/hr with 30 mL Prostat BID from 2100-0900 via J-tube. This regimen  provides 1370 kcal, 79 grams of protein, 159 grams of carb, and 594 mL free water. Pt consumed 100% of dinner last night and 100% of breakfast this AM. Dinner provided ~715 kcal and 22 grams of protein and breakfast provided ~650 kcal and 16 grams of protein. Pt reports that she has been having diarrhea when drinking milk and that she drank it for dinner last night but not for breakfast this AM. She is unsure if she is lactose intolerant.   Order for Nepro once/day (420 kcal and 19 grams of protein) placed on 5/14 but pt and daughter deny pt receiving it Monday or yesterday. Communicated with RN who reports she will make sure pt receives it today. Pt also likes Boost Breeze and is aware that she is able to ask for this from floor stock.   Will adjust TF regimen as outlined above in the hope of improving glycemic control. Glucerna 1.5 not available at this facility but may be a better option for home use. When pt discharges recommend Glucerna 1.5 @ 65 mL/hr from 2100-0900 to provide 1170 kcal, 64 grams of protein, 12.5 grams of fiber, and 592 mL free water.  Adjusted TF regimen + intakes of meals + Nepro BID will meet estimated nutrition needs but unsure if PO intakes will be as great at home or if pt will be consuming oral nutrition supplement at home.   Medications reviewed; 20 mg IV Lasix/day, sliding scale Novolog, 8 units Lantus/day, 137 mcg oral Synthroid/day, 40 mg Solu-medrol BID, 40 mEq oral KCl x2 doses yesterday. Labs reviewed; CBGs: 191-363 mg/dL since 0012  on 5/15, BUN: 54 mg/dL, creatinine: 1.04 mg/dL, Phos: 2.2 mg/dL, GFR: 53 mL/min. HgbA1c on 2/27 was 8% and notes indicate plan to repeat this during this admission.     5/14 - Patient in room with daughter at bedside.  - Pt reports drinking 1-2 Nepro shakes daily and she would prefer these over Glucerna during this admission.  - Pt and family both confirm that pt receives Osmolite 1.5 @ 65 ml/hr x 12 hours every night from 9pm to 9am.   - SLP evaluated today: recommend nectar thickened liquids given dysphagia. Pt with thickener in room. - TF regimen + supplements (1 Nepro daily) provides 1795 kcal and 98g protein. - Per chart review, pt has gained weight since 4/28.  - Nutrition-Focused physical exam completed. Findings are moderate fat depletion, severe muscle depletion, and no edema.  - Would contribute some degree of depletion to advanced age.   Diet Order:  Diet full liquid Room service appropriate? Yes; Fluid consistency: Nectar Thick  Skin:    L DM foot ulcer  Last BM:  5/15  Height:   Ht Readings from Last 1 Encounters:  08/12/16 _0  (1.676 m)    Weight:   Wt Readings from Last 1 Encounters:  08/16/16 144 lb 2.9 oz (65.4 kg)    Ideal Body Weight:  59.1 kg  BMI:  Body mass index is 23.27 kg/m.  Estimated Nutritional Needs:   Kcal:  1500-1700  Protein:  80-90g  Fluid:  1.5L/day  EDUCATION NEEDS:   Education needs addressed    Jarome Matin, MS, RD, LDN, CNSC Inpatient Clinical Dietitian Pager # [phone] After hours/weekend pager # [phone]

## 2016-08-16 NOTE — Progress Notes (Signed)
No charge note.  Palliative consult received.   Called patient's daughter [name] and left a message req return call to arrange [place] meeting.  Mariana Kaufman, AGNP-C Palliative Medicine  Please call Palliative Medicine team phone with any questions [phone]. For individual providers please see AMION.

## 2016-08-16 NOTE — Progress Notes (Signed)
ANTICOAGULATION CONSULT NOTE  Pharmacy Consult for Warfarin Indication: DVT and afib  Allergies  Allergen Reactions  . Penicillins Itching, Rash and Other (See Comments)    Tolerated unasyn, zosyn & cephalosporins in the past.  Has patient had a PCN reaction causing immediate rash, facial/tongue/throat swelling, SOB or lightheadedness with hypotension: Yes Has patient had a PCN reaction causing severe rash involving mucus membranes or skin necrosis: Unk Has patient had a PCN reaction that required hospitalization: Unk Has patient had a PCN reaction occurring within the last 10 years: Unk If all of the above answers are "NO", then may proceed with Cephalosporins  . Other     Patient tolerates amoxicillin and cephalosporins    Patient Measurements: Height: 5\' 6"  (167.6 cm) Weight: 144 lb 2.9 oz (65.4 kg) IBW/kg (Calculated) : 59.3 Heparin Dosing Weight:   Vital Signs: Temp: 97.9 F (36.6 C) (05/16 0416) Temp Source: Oral (05/16 0416) BP: 157/65 (05/16 0416) Pulse Rate: 98 (05/16 0416)  Labs:  Recent Labs  08/14/16 0505 08/15/16 0545 08/16/16 0616  HGB 9.0* 8.9* 9.1*  HCT 27.0* 27.1* 28.2*  PLT 371 348 351  LABPROT 19.6* 22.6* 28.1*  INR 1.64 1.96 2.57  CREATININE 1.07* 1.11* 1.04*    Estimated Creatinine Clearance: 33 mL/min (A) (by C-G formula based on SCr of 1.04 mg/dL (H)).  Assessment: Shannon [name] with hx of DVT and AFib on chronic warfarin prior to admission.  INR on admit < 2.  MD wants warfarin to start 5/13.  Home warfarin regimen: 3.75mg  daily with 2.5mg  on Mondays  Today, 08/16/2016:  INR therapeutic this am - large jump overnight following higher doses for subtherapeutic INR (looks like dose missed on 5/12)  CBC: Hgb low/stable, pltc WNL  Full liquid diet/TF qhs resumed 5/14 hs  No major drug interactions.  Azithromycin and corticosteroids may potentiate warfarin's effects  Goal of Therapy:  INR 2-3  Plan:   Warfarin 1 mg PO tonight, reduce dose  for INR rate of rise  Daily INR  Monitor for bleeding  Doreene Eland, PharmD, BCPS.   Pager: [phone] 08/16/2016 7:53 AM

## 2016-08-17 ENCOUNTER — Telehealth: Payer: Self-pay | Admitting: Cardiology

## 2016-08-17 ENCOUNTER — Ambulatory Visit: Payer: Medicare Other | Admitting: Physician Assistant

## 2016-08-17 DIAGNOSIS — Z515 Encounter for palliative care: Secondary | ICD-10-CM

## 2016-08-17 DIAGNOSIS — N183 Chronic kidney disease, stage 3 (moderate): Secondary | ICD-10-CM

## 2016-08-17 DIAGNOSIS — Z7189 Other specified counseling: Secondary | ICD-10-CM

## 2016-08-17 LAB — BASIC METABOLIC PANEL
Anion gap: 9 (ref 5–15)
BUN: 47 mg/dL — ABNORMAL HIGH (ref 6–20)
CO2: 27 mmol/L (ref 22–32)
Calcium: 9.3 mg/dL (ref 8.9–10.3)
Chloride: 104 mmol/L (ref 101–111)
Creatinine, Ser: 0.85 mg/dL (ref 0.44–1.00)
GFR calc Af Amer: >60 mL/min (ref >60)
GFR calc non Af Amer: 58 mL/min — ABNORMAL LOW (ref >60)
Glucose, Bld: 81 mg/dL (ref 65–99)
Potassium: 3.7 mmol/L (ref 3.5–5.1)
Sodium: 140 mmol/L (ref 135–145)

## 2016-08-17 LAB — CBC WITH DIFFERENTIAL/PLATELET
Basophils Absolute: 0 10*3/uL (ref 0.0–0.1)
Basophils Relative: 0 %
Eosinophils Absolute: 0 10*3/uL (ref 0.0–0.7)
Eosinophils Relative: 0 %
HCT: 29.4 % — ABNORMAL LOW (ref 36.0–46.0)
Hemoglobin: 9.6 g/dL — ABNORMAL LOW (ref 12.0–15.0)
Lymphocytes Relative: 11 %
Lymphs Abs: 1.3 10*3/uL (ref 0.7–4.0)
MCH: 29.2 pg (ref 26.0–34.0)
MCHC: 32.7 g/dL (ref 30.0–36.0)
MCV: 89.4 fL (ref 78.0–100.0)
Monocytes Absolute: 1.5 10*3/uL — ABNORMAL HIGH (ref 0.1–1.0)
Monocytes Relative: 13 %
Neutro Abs: 8.8 10*3/uL — ABNORMAL HIGH (ref 1.7–7.7)
Neutrophils Relative %: 76 %
Platelets: 341 10*3/uL (ref 150–400)
RBC: 3.29 MIL/uL — ABNORMAL LOW (ref 3.87–5.11)
RDW: 18.2 % — ABNORMAL HIGH (ref 11.5–15.5)
WBC: 11.6 10*3/uL — ABNORMAL HIGH (ref 4.0–10.5)

## 2016-08-17 LAB — HEMOGLOBIN A1C
Hgb A1c MFr Bld: 7.7 % — ABNORMAL HIGH (ref 4.8–5.6)
Mean Plasma Glucose: 174 mg/dL

## 2016-08-17 LAB — EXPECTORATED SPUTUM ASSESSMENT W GRAM STAIN, RFLX TO RESP C

## 2016-08-17 LAB — GLUCOSE, CAPILLARY
Glucose-Capillary: 224 mg/dL — ABNORMAL HIGH (ref 65–99)
Glucose-Capillary: 89 mg/dL (ref 65–99)
Glucose-Capillary: 94 mg/dL (ref 65–99)
Glucose-Capillary: 167 mg/dL — ABNORMAL HIGH (ref 65–99)
Glucose-Capillary: 181 mg/dL — ABNORMAL HIGH (ref 65–99)
Glucose-Capillary: 185 mg/dL — ABNORMAL HIGH (ref 65–99)

## 2016-08-17 LAB — PROTIME-INR
INR: 2.74
Prothrombin Time: 29.6 seconds — ABNORMAL HIGH (ref 11.4–15.2)

## 2016-08-17 MED ORDER — CARVEDILOL 3.125 MG PO TABS
3.1250 mg | ORAL_TABLET | Freq: Two times a day (BID) | ORAL | Status: DC
  Administered 2016-08-17 – 2016-08-18 (×4): 3.125 mg via ORAL
  Filled 2016-08-17 (×4): qty 1

## 2016-08-17 MED ORDER — GABAPENTIN 300 MG PO CAPS
600.0000 mg | ORAL_CAPSULE | Freq: Three times a day (TID) | ORAL | Status: DC
  Administered 2016-08-17 – 2016-08-18 (×4): 600 mg via ORAL
  Filled 2016-08-17 (×4): qty 2

## 2016-08-17 MED ORDER — WARFARIN SODIUM 2.5 MG PO TABS
2.5000 mg | ORAL_TABLET | Freq: Once | ORAL | Status: AC
  Administered 2016-08-17: 2.5 mg via ORAL
  Filled 2016-08-17: qty 1

## 2016-08-17 MED ORDER — INSULIN GLARGINE 100 UNIT/ML ~~LOC~~ SOLN
5.0000 [IU] | Freq: Every day | SUBCUTANEOUS | Status: DC
  Administered 2016-08-17: 5 [IU] via SUBCUTANEOUS
  Filled 2016-08-17 (×3): qty 0.05

## 2016-08-17 MED ORDER — PREDNISONE 20 MG PO TABS
20.0000 mg | ORAL_TABLET | Freq: Every day | ORAL | Status: AC
  Administered 2016-08-18: 20 mg via ORAL
  Filled 2016-08-17: qty 1

## 2016-08-17 NOTE — Progress Notes (Signed)
Awaiting palliative cons recc. From [facility] meeting-recc if d/c plan is home will need [facility] to [facility] already following-may want to consider cortisone injections to assist w/allowing patient to stay @ home, & avoid re-admissions.

## 2016-08-17 NOTE — Telephone Encounter (Signed)
New message     The forms you sent back are signed in wrong area and and there are 2 orders missing

## 2016-08-17 NOTE — Progress Notes (Signed)
Speech Language Pathology Treatment: Dysphagia  Patient Details Name: Shannon [name] MRN: [id number] DOB: 1923-07-30 Today's Date: 08/17/2016 Time: 5956-3875 SLP Time Calculation (min) (ACUTE ONLY): 15 min  Assessment / Plan / Recommendation Clinical Impression  Pt seen for assessment of diet tolerance and education following BSE completed 08/14/16. Pt was observed during lunch of full liquids and nectar thick liquid. Pt verbalized safe swallow strategies, and implemented them independently. No overt s/s aspiration observed on any po trials. Safe swallow precautions posted at [facility]. ST will continue to follow for diet tolerance assessment and readiness to advance diet.    HPI HPI: 83 yo female adm to [facility] with cough, DOE, wheeze. Pt has h/o dysphagia, paraesophageal hernia, GERD, gastric stenosis s/p G now J tube, ventral hernia with bowel obstruction, ? CVA.  Pt has prior swallow study done 05/2016 with recommendations for nectar liquids unless chin tuck can be fully completed due to silent aspiration of thin. CXR bilateral ATX, left base infiltrate not excluded 5/14.  Swallow evaluation ordered.          SLP Plan  Continue with current plan of care       Recommendations  Diet recommendations: Nectar-thick liquid (full liquid) Liquids provided via: Straw Medication Administration: Whole meds with puree Supervision: Patient able to self feed;Intermittent supervision to cue for compensatory strategies Compensations: Slow rate;Small sips/bites;Follow solids with liquid;Minimize environmental distractions Postural Changes and/or Swallow Maneuvers: Seated upright 90 degrees         Oral Care Recommendations: Oral care BID Follow up Recommendations:  (TBD) SLP Visit Diagnosis: Dysphagia, oropharyngeal phase (R13.12) Plan: Continue with current plan of care       Celia B. Murvin Natal [facility], CCC-SLP [phone]  Leigh Aurora 08/17/2016, 11:50 AM

## 2016-08-17 NOTE — Progress Notes (Signed)
ANTICOAGULATION CONSULT NOTE  Pharmacy Consult for Warfarin Indication: DVT and afib  Allergies  Allergen Reactions  . Penicillins Itching, Rash and Other (See Comments)    Tolerated unasyn, zosyn & cephalosporins in the past.  Has patient had a PCN reaction causing immediate rash, facial/tongue/throat swelling, SOB or lightheadedness with hypotension: Yes Has patient had a PCN reaction causing severe rash involving mucus membranes or skin necrosis: Unk Has patient had a PCN reaction that required hospitalization: Unk Has patient had a PCN reaction occurring within the last 10 years: Unk If all of the above answers are "NO", then may proceed with Cephalosporins  . Other     Patient tolerates amoxicillin and cephalosporins    Patient Measurements: Height: 5\' 6"  (167.6 cm) Weight: 145 lb 8.1 oz (66 kg) IBW/kg (Calculated) : 59.3 Heparin Dosing Weight:   Vital Signs: Temp: 97.9 F (36.6 C) (05/17 0621) Temp Source: Oral (05/17 0621) BP: 146/81 (05/17 0621) Pulse Rate: 85 (05/17 0621)  Labs:  Recent Labs  08/15/16 0545 08/16/16 0616 08/17/16 0544  HGB 8.9* 9.1* 9.6*  HCT 27.1* 28.2* 29.4*  PLT 348 351 341  LABPROT 22.6* 28.1* 29.6*  INR 1.96 2.57 2.74  CREATININE 1.11* 1.04* 0.85    Estimated Creatinine Clearance: 40.4 mL/min (by C-G formula based on SCr of 0.85 mg/dL).  Assessment: 45 YOF with hx of DVT and AFib on chronic warfarin prior to admission.  INR on admit < 2.  MD wants warfarin to start 5/13.  Home warfarin regimen: 3.75mg  daily with 2.5mg  on Mondays  Today, 08/17/2016:  INR therapeutic this am ( = 2.74) INR rate of rise improved. (looks like dose missed on 5/12)  CBC: Hgb low but improved, pltc WNL  Full liquid diet/TF qhs resumed 5/14 hs  No major drug interactions.  Azithromycin and corticosteroids may potentiate warfarin's effects  Goal of Therapy:  INR 2-3  Plan:   Warfarin 2.5 mg PO tonight, reduced dose vs normal home dose as INR  trending up to upper limit goal  Daily INR  Monitor for bleeding  Doreene Eland, PharmD, BCPS.   Pager: [phone] 08/17/2016 7:53 AM

## 2016-08-17 NOTE — Progress Notes (Signed)
CSW consulted to assist with palliative care services at home. CSW is unable to assist with this request. RNCM has been alerted and will assist with d/c planning needs.  Werner Lean LCSW [phone]

## 2016-08-17 NOTE — Consult Note (Signed)
Consultation Note Date: 08/17/2016   Patient Name: Shannon [name]  DOB: 06/17/1923  MRN: [id number]  Age / Sex: 83 y.o., female  PCP: Kirt Boys, DO Referring Physician: Albertine Grates, MD  Reason for Consultation: Establishing goals of care  HPI/Patient Profile: 83 y.o. female  with past medical history of COPD, CAD, HTN, CKD (III), dementia (mild), G-tube placement for gastric outlet  admitted on 08/12/2016 with dyspnea. Workup reveals CHF exacerbation. She has also been found to have gangrenous toes for which she has declined amputation. Palliative medicine consulted per daughter, [name]'s request.   Clinical Assessment and Goals of Care: Met with patient, who is familiar to palliative service after multiple consults and her daughter at bedside. Patient is very clear in her goals of not having surgical amputation. States she has multiple medical problems and feels that surgery may complicate those problems further. She would rather manage the gangrene with medication, wound care and pain management. We discussed the possibility of infection, systemic infection and eventual death. She understands this and says she is prepared to die when it is her time. We discussed other goals that include staying as independent as long as possible. She says for now she can still move around her home, and care for her ADL's. She says if there came a point that she could not do that then she would want Hospice and she would not want to return to the hospital for any treatments. But for now, she wants to continue life prolonging treatments, declines Hospice. However, she and her daughter agree to followup visits from community Palliative provider for continued GOC and symptom management. Will ask SW to make referral for outpatient Palliative f/u.  Primary Decision Maker PATIENT - with support of daughter [name]    SUMMARY OF  RECOMMENDATIONS -Social work referral to arrange outpatient palliative service for symptom management and continued GOC -increase gabapentin to 600mg  TID for increased pain control    Code Status/Advance Care Planning:  DNR    Symptom Management:   As above  Palliative Prophylaxis:   Frequent Pain Assessment  Additional Recommendations (Limitations, Scope, Preferences):  No Surgical Procedures  Prognosis:    Unable to determine  Discharge Planning: Home with Palliative Services  Primary Diagnoses: Present on Admission: . COPD (chronic obstructive pulmonary disease) (HCC) . Hypothyroidism . Essential hypertension, benign . Dyslipidemia . Chronic kidney insufficiency, stage 3 (moderate) . Depression . SOB (shortness of breath)   I have reviewed the medical record, interviewed the patient and family, and examined the patient. The following aspects are pertinent.  Past Medical History:  Diagnosis Date  . Anemia    previous blood transfusions  . Arthritis    "all over"  . Asthma   . Bradycardia    requiring previous d/c of BB and reduction of amiodarone  . CAD (coronary artery disease)    nonobstructive per notes  . Chronic diastolic CHF (congestive heart failure) (HCC)   . CKD (chronic kidney disease), stage III   . Complication of blood transfusion    "  got the wrong blood type at [facility] in ~ 2015; no adverse reaction that we are aware of"/daughter, [name] (01/27/2016)  . COPD (chronic obstructive pulmonary disease) (HCC)   . Depression    "light case"  . DVT (deep venous thrombosis) (HCC) 01/2016   a. LLE DVT 01/2016 - switched from Eliquis to Coumadin.  . Gastric stenosis    a. s/p stomach tube  . GERD (gastroesophageal reflux disease)   . History of blood transfusion    "several" (01/27/2016)  . History of stomach ulcers   . Hyperlipidemia   . Hypertension   . Hypothyroidism   . Paraesophageal hernia   . Perforated gastric ulcer (HCC)   .  Seasonal allergies   . SIADH (syndrome of inappropriate ADH production) (HCC)    [name] 01/10/2015  . Small bowel obstruction (HCC)    "I don't know how many" (01/11/2015)  . Stroke (HCC)    "light one"  . Type II diabetes mellitus (HCC)    "related to prednisone use  for > 20 yr; once predinose stopped; no more DM RX" (01/27/2016)  . Ventral hernia with bowel obstruction    Social History   Social History  . Marital status: Widowed    Spouse name: N/A  . Number of children: 3  . Years of education: N/A   Social History Main Topics  . Smoking status: Never Smoker  . Smokeless tobacco: Former Neurosurgeon    Types: Snuff     Comment: "used snuff in my younger days"  . Alcohol use No  . Drug use: No  . Sexual activity: No   Other Topics Concern  . None   Social History Narrative   Married in [place], lives in one story house with 2 people and no pets   Occupation: ?   Has a living Will, [place], and doesn't have DNR   Was living with Husband (in frail health) in [place] until 09/2013.  After her [facility] admission they both came to live with daughter in [place]   Family History  Problem Relation Age of Onset  . Stroke Mother   . Hypertension Mother   . Diabetes Brother   . Heart attack Neg Hx    Scheduled Meds: . amLODipine  10 mg Oral Daily  . amoxicillin-clavulanate  1 tablet Oral Q12H  . atorvastatin  10 mg Oral Daily  . carvedilol  3.125 mg Oral BID WC  . cycloSPORINE  1 drop Both Eyes BID  . feeding supplement (GLUCERNA 1.2 CAL)  1,500 mL Per Tube Q24H  . feeding supplement (NEPRO CARB STEADY)  237 mL Oral BID BM  . fluticasone  2 spray Each Nare Daily  . furosemide  20 mg Oral Daily  . gabapentin  300 mg Oral 2 times per day  . gabapentin  600 mg Oral Q breakfast  . guaiFENesin  1,200 mg Oral BID  . insulin aspart  0-15 Units Subcutaneous Q4H  . insulin glargine  8 Units Subcutaneous Q2200  . ipratropium-albuterol  3 mL Nebulization TID  . isosorbide mononitrate  30  mg Oral Daily  . latanoprost  1 drop Both Eyes QHS  . levothyroxine  137 mcg Oral QAC breakfast  . losartan  50 mg Oral Daily  . polyvinyl alcohol  1 drop Both Eyes QID  . sertraline  50 mg Oral Daily  . silver sulfADIAZINE   Topical Daily  . sodium chloride flush  3 mL Intravenous Q12H  . SYSTANE NIGHTTIME  1 application Ophthalmic QHS  . warfarin  2.5 mg Oral ONCE-1800  . Warfarin - Pharmacist Dosing Inpatient   Does not apply q1800   Continuous Infusions: PRN Meds:.acetaminophen, albuterol, hydrALAZINE, ondansetron **OR** ondansetron (ZOFRAN) IV Medications Prior to Admission:  Prior to Admission medications   Medication Sig Start Date End Date Taking? Authorizing Provider  acetaminophen (TYLENOL) 500 MG tablet Take 1,000 mg by mouth every 6 (six) hours as needed (pain).   Yes [provider]  AMINO ACIDS-PROTEIN HYDROLYS PO Take 30 mLs by mouth 2 (two) times daily.   Yes [provider]  amLODipine (NORVASC) 10 MG tablet TAKE 1 TABLET BY MOUTH DAILY 08/09/16  Yes Lars Masson, MD  atorvastatin (LIPITOR) 10 MG tablet TAKE ONE TABLET BY MOUTH ONCE DAILY 05/22/16  Yes Kirt Boys, DO  b complex vitamins tablet Take 1 tablet by mouth daily.    Yes [provider]  calcium-vitamin D (OSCAL WITH D) 500-200 MG-UNIT per tablet Take 1 tablet by mouth daily with breakfast.    Yes [provider]  chlorhexidine (PERIDEX) 0.12 % solution Use as directed 15 mLs in the mouth or throat 2 (two) times daily.   Yes [provider]  clindamycin (CLEOCIN) 300 MG capsule Take 1 capsule (300 mg total) by mouth every 8 (eight) hours. Patient taking differently: Take 300 mg by mouth every 8 (eight) hours. 0600,1400,2200 08/01/16  Yes Rhetta Mura, MD  fluticasone (FLONASE) 50 MCG/ACT nasal spray Place 2 sprays into both nostrils daily. 08/07/14  Yes Montez Morita, Monica, DO  furosemide (LASIX) 20 MG tablet Take 1 tablet (20 mg total) by mouth daily. 08/07/16  Yes  Sharon Seller, NP  gabapentin (NEURONTIN) 300 MG capsule 1 tablet by mouth three times daily, may have additional dose every 8 hours as needed pain Patient taking differently: Take 300-600 mg by mouth 3 (three) times daily. Take 600mg  by mouth in the morning, 300mg  by mouth in the afternoon, and by mouth at bedtime 08/07/16  Yes Sharon Seller, NP  Insulin Glargine (LANTUS SOLOSTAR) 100 UNIT/ML Solostar Pen Inject 5 Units into the skin daily at 10 pm. 06/02/16  Yes Kirt Boys, DO  ipratropium-albuterol (DUONEB) 0.5-2.5 (3) MG/3ML SOLN Take 3 mLs by nebulization every 6 (six) hours as needed (sob). Use 3 times daily x 4 days then every 6 hours as needed. 03/21/16  Yes Janetta Hora, PA-C  isosorbide mononitrate (IMDUR) 30 MG 24 hr tablet Take 30 mg by mouth daily. 07/20/16  Yes [provider]  latanoprost (XALATAN) 0.005 % ophthalmic solution Place 1 drop into both eyes at bedtime. 06/02/14  Yes Oneal Grout, MD  levalbuterol [facility] HFA) 45 MCG/ACT inhaler Inhale 2 puffs into the lungs every 6 (six) hours as needed for wheezing or shortness of breath. 02/03/16  Yes Montez Morita, Monica, DO  levothyroxine (SYNTHROID, LEVOTHROID) 137 MCG tablet TAKE 1 TABLET (137 MCG TOTAL) BY MOUTH DAILY BEFORE BREAKFAST. 07/31/16  Yes Kimber Relic, MD  losartan (COZAAR) 50 MG tablet Take 50 mg by mouth daily. 06/16/16  Yes [provider]  Maltodextrin-Xanthan Gum (RESOURCE THICKENUP CLEAR) POWD Take 120 g by mouth as needed (use to thicken liquids.). 11/11/15  Yes Rodolph Bong, MD  Multiple Vitamin (MULTIVITAMIN WITH MINERALS) TABS tablet Take 1 tablet by mouth daily.   Yes [provider]  Nutritional Supplements (FEEDING SUPPLEMENT, OSMOLITE 1.5 CAL,) LIQD Place 1,000 mLs into feeding tube daily. 09/30/15  Yes Calvert Cantor, MD  Polyethyl Glycol-Propyl  Glycol 0.4-0.3 % SOLN Place 1 drop into both eyes 4 (four) times daily.    Yes [provider]  polyethylene  glycol (MIRALAX / GLYCOLAX) packet Take 17 g by mouth daily as needed for mild constipation.   Yes [provider]  RESTASIS 0.05 % ophthalmic emulsion USE 1 DROP INTO BOTH EYES TWICE DAILY 06/28/16  Yes Kimber Relic, MD  sertraline (ZOLOFT) 100 MG tablet TAKE ONE TABLET BY MOUTH ONCE DAILY FOR DEPRESSION 04/10/16  Yes Kirt Boys, DO  Water For Irrigation, Sterile (FREE WATER) SOLN Place 230 mLs into feeding tube 3 (three) times daily.   Yes [provider]  warfarin (COUMADIN) 2.5 MG tablet TAKE AS DIRECTED BY COUMADIN CLINIC. 08/15/16   Lars Masson, MD   Allergies  Allergen Reactions  . Penicillins Itching, Rash and Other (See Comments)    Tolerated unasyn, zosyn & cephalosporins in the past.  Has patient had a PCN reaction causing immediate rash, facial/tongue/throat swelling, SOB or lightheadedness with hypotension: Yes Has patient had a PCN reaction causing severe rash involving mucus membranes or skin necrosis: Unk Has patient had a PCN reaction that required hospitalization: Unk Has patient had a PCN reaction occurring within the last 10 years: Unk If all of the above answers are "NO", then may proceed with Cephalosporins  . Other     Patient tolerates amoxicillin and cephalosporins   Review of Systems  Physical Exam  Constitutional: She is oriented to person, place, and time. She appears well-developed. No distress.  Cardiovascular: Normal rate.   Neurological: She is alert and oriented to person, place, and time.  Skin: Skin is warm and dry.  Psychiatric: She has a normal mood and affect. Her behavior is normal. Judgment and thought content normal.  Nursing note and vitals reviewed.   Vital Signs: BP (!) 148/70 (BP Location: Left Arm)   Pulse 78   Temp 98.1 F (36.7 C) (Oral)   Resp 16   Ht 5\' 6"  (1.676 m)   Wt 66 kg (145 lb 8.1 oz)   SpO2 100%   BMI 23.48 kg/m  Pain Assessment: No/denies pain POSS *See Group Information*:  1-Acceptable,Awake and alert Pain Score: 0-No pain   SpO2: SpO2: 100 % O2 Device:SpO2: 100 % O2 Flow Rate: .   IO: Intake/output summary:  Intake/Output Summary (Last 24 hours) at 08/17/16 1547 Last data filed at 08/17/16 1500  Gross per 24 hour  Intake             1280 ml  Output                0 ml  Net             1280 ml    LBM: Last BM Date: 08/13/16 Baseline Weight: Weight: 68.3 kg (150 lb 9.2 oz) Most recent weight: Weight: 66 kg (145 lb 8.1 oz)     Palliative Assessment/Data: PPS: 50%     Thank you for this consult. Palliative medicine will continue to follow and assist as needed.    Time In: 1130 Time Out: 1245 Time Total: 75 mins Greater than 50%  of this time was spent counseling and coordinating care related to the above assessment and plan.  Signed by: Ocie Bob, AGNP-C Palliative Medicine    Please contact Palliative Medicine Team phone at [phone] for questions and concerns.  For individual provider: See Loretha Stapler

## 2016-08-17 NOTE — Telephone Encounter (Signed)
Left a message for [facility] rep to call back about completed paperwork Dr Meda Coffee signed.  Both orders were there and signed by Dr Meda Coffee.  Advised to call back for further assistance.

## 2016-08-17 NOTE — Progress Notes (Signed)
PROGRESS NOTE    [name]  GNF:[id number] DOB: January 30, 1924 DOA: 08/12/2016 PCP: Kirt Boys, DO  Brief Narrative:  Shannon [name] is a 83 y.o. female with known COPD, CAD, HTN, CKD stage III, dementia, presented to [facility] ED with main concern of several days duration of progressively worsening dyspnea, initially present with exertion but has progressed to dyspnea at rest for the past 24 hours, associated with wheezing, non productive cough, chest tightness that occurs with coughing spells, poor oral intake. In the ED the paient initially had significant dyspnea but improved with nebulizer treatments and solumedrol. Currently VSS, TRH asked to admit for further evaluation for COPD Exacerbation. Patient is breathing a little better but complains of SOB when coughing.   Assessment & Plan:   Active Problems:   Essential hypertension, benign   Dyslipidemia   Diabetes mellitus type 2 in nonobese (HCC)   Hypothyroidism   Depression   Chronic kidney insufficiency, stage 3 (moderate)   SOB (shortness of breath)   PEG (percutaneous endoscopic gastrostomy) status (HCC)   COPD (chronic obstructive pulmonary disease) (HCC)   Hypokalemia  Acute Exacerbation of COPD (baseline not o2 dependent, not steroid dependent)  with Hx of Asthma, recurrent aspiration pneumonia -she presented with wheezing, cough, dyspnea, respiratory viral panel negative, she dose not have productive cough -she is treated with IV Azithromycin for COPD since admission, will change to oral augmentin to cover aspiration pna ( though pcn listed at allergy, she has been on amoxicillin and augmentin repeatedly in the past), continue guaifenesin, nebs, steroids --cxr  On 5/14 showed Mediastinum hilar structures are normal. Cardiomegaly. Low lung volumes with bibasilar atelectasis. Left base infiltrate cannot be excluded. Tiny left pleural effusion cannot be excluded. No pneumothorax. -cxr on 5/16 1. Low lung volumes with interim improvement  of aeration of left lung base. No infiltrate noted. 2. Stable cardiomegaly.  No pulmonary venous congestion. -SLP to Evaluate and recommended Nectar Thick Liquids (Pt has prior swallow study done 05/2016 with recommendations for nectar liquids unless chin tuck can be fully completed due to silent aspiration of thin. 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 -Last HbA1c on 05/30/16 was 8.; Repeat HbA1c this visit 7.7 -Blood glucose elevated due to steroids use and on nondiabetic tube feeds -C/w Home Gabapentin Regimen  -change tube feed to glucerna, taper steroids, continue adjust insulin  HTN --Continue home medical regimen with Amlodipine 10 mg po Daily, Furosemide 20 mg Daily, Imdur 30 mg po Daily, and with Losartan 50 mg po Daily -Has Hydralazine 5 mg IV q4hprn for SBP >155 when needed  Hypokalemia -repleted   CKD stage III -Cr is at baseline  Gastric Stenosis/Outlet Obstruction s/p PEG --SLP to Evaluate and recommended Nectar Thick Liquids -Gets Tube Feedings at Night and Dietician Consulted for help in management of Nightly Tube Feedings  -discontinue Osmolite 1000 mL due to hyperglycemia, change to glucerna tube feeds, C/w Nepro 237 mL po q24h, Prostat 30 mL per tube BID  Hx of DVT - Continue Coumadin per Pharmacy Dosing   Chronic Normocytic Anemia -hgb baseline around 8-9 -No S/Sx of Bleeding   Hyperlipidemia -C/w Atorvastatin 10 mg po Daily  Hypothyroidism -C/w Levothyroxine 137 mcg po Daily  Depression -C/w Sertraline 50 mg po Daily  Left Foot Second Toe Necrosis -WOC Nurse Consultation  appreciated; Patient would like to see WOC nurse again today.  -she is discharged on Clindamycin 300 mg po q8h for 30days start from 4/27, now on augmentin -, per patient she does not want to have surgery , daughter is requesting palliative care   DVT prophylaxis: Anticoagulated with Coumadin  Code Status: DO NOT RESUSCITATE Family Communication: Discussed with Daugther at bedside Disposition Plan: Home Health PT , planned discharge on 5/18  Consultants:   Palliative care   Procedures: None   Antimicrobials:  Anti-infectives    Start     Dose/Rate Route Frequency Ordered Stop   08/16/16 1800  amoxicillin-clavulanate (AUGMENTIN) 875-125 MG per tablet 1 tablet     1 tablet Oral Every 12 hours 08/16/16 1730     08/14/16 1100  clindamycin (CLEOCIN) capsule 300 mg  Status:  Discontinued     300 mg Oral Every 8 hours 08/14/16 1009 08/16/16 1730   08/13/16 1000  azithromycin (ZITHROMAX) 500 mg in dextrose 5 % 250 mL IVPB  Status:  Discontinued     500 mg 250 mL/hr over 60 Minutes Intravenous Every 24 hours 08/13/16 0943 08/16/16 1729     Subjective: Feeling better, still has cough, no wheezing, on room air Daughter at bedside    Objective: Vitals:   08/16/16 2105 08/17/16 0621 08/17/16 0752 08/17/16 1328  BP: (!) 179/84 (!) 146/81  (!) 148/70  Pulse: 94 85  78  Resp:  20  16  Temp: 97.5 F (36.4 C) 97.9 F (36.6 C)  98.1 F (36.7 C)  TempSrc: Oral Oral  Oral  SpO2: 100% 99% 96% 100%  Weight:  66 kg (145 lb 8.1 oz)    Height:        Intake/Output Summary (Last 24 hours) at 08/17/16 1755 Last data filed at 08/17/16 1500  Gross per 24 hour  Intake             1040 ml  Output                0 ml  Net             1040 ml   Filed Weights   08/15/16 0435 08/16/16 0416 08/17/16 0621  Weight: 65.4 kg (144 lb 2.9 oz) 65.4 kg (144 lb 2.9 oz) 66 kg (145 lb 8.1 oz)   Examination: Physical Exam:  Constitutional:  frail but NAD sitting bedside Eyes: Sclerae anicteric.  Conjuntivae non-injected. Lids Normal ENMT:  External Ears and Nose appear normal. Mucous membranes appear moist. Patient has grossly normal hearing. Neck: No JVD or evidence of thyromegaly Respiratory: Diminished on left side with crackles , previously documented wheezing seems has resolved. Patient not tachypenic or using any accessory muscles to breath. Cardiovascular: RRR S1, S2, no lower extremity edema Abdomen: Soft, NT, ND. Bowel sounds present. PEG tube in place without erythema GU: Deferred Musculoskeletal: No contractures. Good ROM. Skin:. Left 2nd to necrotic. Warm and dry. No rashes Neurologic:  CN 2-12 grossly intact. No focal deficits. Psychiatric: Pleasant mood and affect. Intact Judgement and insight. Awake and alert.   Data Reviewed: I have personally reviewed following labs and imaging studies  CBC:  Recent Labs Lab 08/13/16 0520 08/14/16 0505 08/15/16 0545 08/16/16 0616 08/17/16 0544  WBC 11.0* 16.6* 12.1* 10.8* 11.6*  NEUTROABS  --  15.8* 11.0* 9.7* 8.8*  HGB 8.7*  9.0* 8.9* 9.1* 9.6*  HCT 26.3* 27.0* 27.1* 28.2* 29.4*  MCV 88.3 87.4 88.3 89.0 89.4  PLT 373 371 348 351 341   Basic Metabolic Panel:  Recent Labs Lab 08/13/16 0520 08/14/16 0505 08/15/16 0545 08/16/16 0616 08/17/16 0544  NA 136 138 136 141 140  K 4.7 3.8 3.3* 4.2 3.7  CL 100* 101 102 107 104  CO2 25 25 23 25 27   GLUCOSE 221* 238* 451* 350* 81  BUN 63* 55* 64* 54* 47*  CREATININE 1.16* 1.07* 1.11* 1.04* 0.85  CALCIUM 10.0 9.8 9.3 9.4 9.3  MG  --  2.1 2.1 1.9  --   PHOS  --  3.7 2.4* 2.2*  --    GFR: Estimated Creatinine Clearance: 40.4 mL/min (by C-G formula based on SCr of 0.85 mg/dL). Liver Function Tests:  Recent Labs Lab 08/14/16 0505 08/15/16 0545 08/16/16 0616  AST 59* 51* 29  ALT 33 40 39  ALKPHOS 59 62 61  BILITOT 0.6 0.5 0.2*  PROT 8.1 7.8 7.7  ALBUMIN 3.2* 3.1* 3.0*   No results for input(s): LIPASE, AMYLASE in the last 168 hours. No results for input(s):  AMMONIA in the last 168 hours. Coagulation Profile:  Recent Labs Lab 08/13/16 0520 08/14/16 0505 08/15/16 0545 08/16/16 0616 08/17/16 0544  INR 1.84 1.64 1.96 2.57 2.74   Cardiac Enzymes: No results for input(s): CKTOTAL, CKMB, CKMBINDEX, TROPONINI in the last 168 hours. BNP (last 3 results)  Recent Labs  05/08/16 1053  PROBNP 1,132*   HbA1C:  Recent Labs  08/16/16 0616  HGBA1C 7.7*   CBG:  Recent Labs Lab 08/17/16 0123 08/17/16 0514 08/17/16 0746 08/17/16 1223 08/17/16 1618  GLUCAP 224* 89 94 167* 181*   Lipid Profile: No results for input(s): CHOL, HDL, LDLCALC, TRIG, CHOLHDL, LDLDIRECT in the last 72 hours. Thyroid Function Tests: No results for input(s): TSH, T4TOTAL, FREET4, T3FREE, THYROIDAB in the last 72 hours. Anemia Panel: No results for input(s): VITAMINB12, FOLATE, FERRITIN, TIBC, IRON, RETICCTPCT in the last 72 hours. Sepsis Labs: No results for input(s): PROCALCITON, LATICACIDVEN in the last 168 hours.  Recent Results (from the past 240 hour(s))  Respiratory Panel by PCR     Status: None   Collection Time: 08/13/16 11:37 AM  Result Value Ref Range Status   Adenovirus NOT DETECTED NOT DETECTED Final   Coronavirus 229E NOT DETECTED NOT DETECTED Final   Coronavirus HKU1 NOT DETECTED NOT DETECTED Final   Coronavirus NL63 NOT DETECTED NOT DETECTED Final   Coronavirus OC43 NOT DETECTED NOT DETECTED Final   Metapneumovirus NOT DETECTED NOT DETECTED Final   Rhinovirus / Enterovirus NOT DETECTED NOT DETECTED Final   Influenza A NOT DETECTED NOT DETECTED Final   Influenza B NOT DETECTED NOT DETECTED Final   Parainfluenza Virus 1 NOT DETECTED NOT DETECTED Final   Parainfluenza Virus 2 NOT DETECTED NOT DETECTED Final   Parainfluenza Virus 3 NOT DETECTED NOT DETECTED Final   Parainfluenza Virus 4 NOT DETECTED NOT DETECTED Final   Respiratory Syncytial Virus NOT DETECTED NOT DETECTED Final   Bordetella pertussis NOT DETECTED NOT DETECTED Final    Chlamydophila pneumoniae NOT DETECTED NOT DETECTED Final   Mycoplasma pneumoniae NOT DETECTED NOT DETECTED Final    Comment: Performed at [facility], [address] [place], [place] [address]  Culture, expectorated sputum-assessment     Status: None   Collection Time: 08/17/16  3:10 PM  Result Value Ref Range Status   Specimen Description EXPECTORATED SPUTUM  Final  Special Requests NONE  Final   Sputum evaluation   Final    Sputum specimen not acceptable for testing.  Please recollect.   Gram Stain Report Called to,Read Back By and Verified With: JOHNSON,A @ 1623 ON [id number] BY POTEAT,S    Report Status 08/17/2016 FINAL  Final    Radiology Studies: Dg Chest Port 1 View  Result Date: 08/16/2016 CLINICAL DATA:  Shortness of breath. EXAM: PORTABLE CHEST 1 VIEW COMPARISON:  08/14/2016. FINDINGS: Mediastinum hilar structures are normal. Stable cardiomegaly. Normal pulmonary vascularity. Low lung volumes, interval improvement of aeration of left lung base. No infiltrate noted. No pleural effusion or pneumothorax. Thoracic spine scoliosis and degenerative change IMPRESSION: 1. Low lung volumes with interim improvement of aeration of left lung base. No infiltrate noted. 2. Stable cardiomegaly.  No pulmonary venous congestion. Electronically Signed   By: Maisie Fus  Register   On: 08/16/2016 06:54   Scheduled Meds: . amLODipine  10 mg Oral Daily  . amoxicillin-clavulanate  1 tablet Oral Q12H  . atorvastatin  10 mg Oral Daily  . carvedilol  3.125 mg Oral BID WC  . cycloSPORINE  1 drop Both Eyes BID  . feeding supplement (GLUCERNA 1.2 CAL)  1,500 mL Per Tube Q24H  . feeding supplement (NEPRO CARB STEADY)  237 mL Oral BID BM  . fluticasone  2 spray Each Nare Daily  . furosemide  20 mg Oral Daily  . gabapentin  600 mg Oral TID  . guaiFENesin  1,200 mg Oral BID  . insulin aspart  0-15 Units Subcutaneous Q4H  . insulin glargine  5 Units Subcutaneous Q2200  . ipratropium-albuterol  3 mL  Nebulization TID  . isosorbide mononitrate  30 mg Oral Daily  . latanoprost  1 drop Both Eyes QHS  . levothyroxine  137 mcg Oral QAC breakfast  . losartan  50 mg Oral Daily  . polyvinyl alcohol  1 drop Both Eyes QID  . sertraline  50 mg Oral Daily  . silver sulfADIAZINE   Topical Daily  . sodium chloride flush  3 mL Intravenous Q12H  . SYSTANE NIGHTTIME  1 application Ophthalmic QHS  . Warfarin - Pharmacist Dosing Inpatient   Does not apply q1800   Continuous Infusions:   LOS: 5 days    Time spent>  Malon Branton, MD PhD [facility] Pager [phone]  If 7PM-7AM, please contact night-coverage [email or web address] Password [facility] 08/17/2016, 5:55 PM

## 2016-08-17 NOTE — Care Management Note (Signed)
Case Management Note  Patient Details  Name: Geet Hosking MRN: [id number] Date of Birth: [birth year]  Subjective/Objective:  Noted referral for palliative care services otpt-contacted [facility] referral specialist-aware to follow in [facility] already following for [place], & [facility].                  Action/Plan:dc home w/HHC, & palliative care services otpt.   Expected Discharge Date:                  Expected Discharge Plan:  [place]  In-House Referral:     Discharge planning Services  CM Consult  Post Acute Care Choice:    Choice offered to:  Adult Children  DME Arranged:    DME Agency:     HH Arranged:  RN, PT [place] Agency:  [place]  Status of Service:  In process, will continue to follow  If discussed at Long Length of Stay Meetings, dates discussed:    Additional Comments:  Dessa Phi, RN 08/17/2016, 1:52 PM

## 2016-08-18 DIAGNOSIS — I10 Essential (primary) hypertension: Secondary | ICD-10-CM

## 2016-08-18 DIAGNOSIS — J690 Pneumonitis due to inhalation of food and vomit: Secondary | ICD-10-CM

## 2016-08-18 LAB — BASIC METABOLIC PANEL
Anion gap: 10 (ref 5–15)
BUN: 49 mg/dL — ABNORMAL HIGH (ref 6–20)
CO2: 24 mmol/L (ref 22–32)
Calcium: 9.1 mg/dL (ref 8.9–10.3)
Chloride: 101 mmol/L (ref 101–111)
Creatinine, Ser: 0.96 mg/dL (ref 0.44–1.00)
GFR calc Af Amer: 58 mL/min — ABNORMAL LOW (ref >60)
GFR calc non Af Amer: 50 mL/min — ABNORMAL LOW (ref >60)
Glucose, Bld: 154 mg/dL — ABNORMAL HIGH (ref 65–99)
Potassium: 4 mmol/L (ref 3.5–5.1)
Sodium: 135 mmol/L (ref 135–145)

## 2016-08-18 LAB — GLUCOSE, CAPILLARY
Glucose-Capillary: 103 mg/dL — ABNORMAL HIGH (ref 65–99)
Glucose-Capillary: 127 mg/dL — ABNORMAL HIGH (ref 65–99)
Glucose-Capillary: 135 mg/dL — ABNORMAL HIGH (ref 65–99)
Glucose-Capillary: 188 mg/dL — ABNORMAL HIGH (ref 65–99)
Glucose-Capillary: 241 mg/dL — ABNORMAL HIGH (ref 65–99)

## 2016-08-18 LAB — PROTIME-INR
INR: 2.17
Prothrombin Time: 24.6 seconds — ABNORMAL HIGH (ref 11.4–15.2)

## 2016-08-18 LAB — CBC WITH DIFFERENTIAL/PLATELET
Basophils Absolute: 0 10*3/uL (ref 0.0–0.1)
Basophils Relative: 0 %
Eosinophils Absolute: 0 10*3/uL (ref 0.0–0.7)
Eosinophils Relative: 0 %
HCT: 31.1 % — ABNORMAL LOW (ref 36.0–46.0)
Hemoglobin: 10.3 g/dL — ABNORMAL LOW (ref 12.0–15.0)
Lymphocytes Relative: 11 %
Lymphs Abs: 1.2 10*3/uL (ref 0.7–4.0)
MCH: 29.2 pg (ref 26.0–34.0)
MCHC: 33.1 g/dL (ref 30.0–36.0)
MCV: 88.1 fL (ref 78.0–100.0)
Monocytes Absolute: 1 10*3/uL (ref 0.1–1.0)
Monocytes Relative: 10 %
Neutro Abs: 8.4 10*3/uL — ABNORMAL HIGH (ref 1.7–7.7)
Neutrophils Relative %: 79 %
Platelets: 383 10*3/uL (ref 150–400)
RBC: 3.53 MIL/uL — ABNORMAL LOW (ref 3.87–5.11)
RDW: 18.1 % — ABNORMAL HIGH (ref 11.5–15.5)
WBC: 10.7 10*3/uL — ABNORMAL HIGH (ref 4.0–10.5)

## 2016-08-18 LAB — MAGNESIUM: Magnesium: 1.9 mg/dL (ref 1.7–2.4)

## 2016-08-18 LAB — TSH: TSH: 0.67 u[IU]/mL (ref 0.350–4.500)

## 2016-08-18 MED ORDER — GLUCERNA 1.2 CAL PO LIQD: ORAL | 0 refills | Status: DC

## 2016-08-18 MED ORDER — CARVEDILOL 6.25 MG PO TABS: 6.2500 mg | ORAL_TABLET | Freq: Two times a day (BID) | ORAL | 0 refills | Status: DC

## 2016-08-18 MED ORDER — AMOXICILLIN-POT CLAVULANATE 875-125 MG PO TABS: 1.0000 | ORAL_TABLET | Freq: Two times a day (BID) | ORAL | 0 refills | Status: AC

## 2016-08-18 MED ORDER — METHYLPREDNISOLONE SODIUM SUCC 125 MG IJ SOLR
60.0000 mg | Freq: Once | INTRAMUSCULAR | Status: AC
  Administered 2016-08-18: 60 mg via INTRAVENOUS
  Filled 2016-08-18: qty 2

## 2016-08-18 MED ORDER — PREDNISONE 10 MG PO TABS: ORAL_TABLET | ORAL | 0 refills | Status: DC

## 2016-08-18 MED ORDER — GABAPENTIN 300 MG PO CAPS: 600.0000 mg | ORAL_CAPSULE | Freq: Three times a day (TID) | ORAL | 0 refills | Status: DC

## 2016-08-18 MED ORDER — INSULIN ASPART 100 UNIT/ML FLEXPEN: PEN_INJECTOR | SUBCUTANEOUS | 0 refills | Status: DC

## 2016-08-18 NOTE — Progress Notes (Signed)
Daily Progress Note   Patient Name: Shannon [name]       Date: 08/18/2016 DOB: Oct 15, 1923  Age: 83 y.o. MRN#: [id number] Attending Physician: Albertine Grates, MD Primary Care Physician: Kirt Boys, DO Admit Date: 08/12/2016  Reason for Consultation/Follow-up: Establishing goals of care  Subjective: Patient sitting up in bed- eating well. Ready to go home. Case manager Bjorn Loser is arranging outpatient Palliative services at home for continued GOC and symptom management.  ROS  Length of Stay: 6  Current Medications: Scheduled Meds:  . amLODipine  10 mg Oral Daily  . amoxicillin-clavulanate  1 tablet Oral Q12H  . atorvastatin  10 mg Oral Daily  . carvedilol  3.125 mg Oral BID WC  . cycloSPORINE  1 drop Both Eyes BID  . feeding supplement (GLUCERNA 1.2 CAL)  1,500 mL Per Tube Q24H  . feeding supplement (NEPRO CARB STEADY)  237 mL Oral BID BM  . fluticasone  2 spray Each Nare Daily  . furosemide  20 mg Oral Daily  . gabapentin  600 mg Oral TID  . guaiFENesin  1,200 mg Oral BID  . insulin aspart  0-15 Units Subcutaneous Q4H  . insulin glargine  5 Units Subcutaneous Q2200  . ipratropium-albuterol  3 mL Nebulization TID  . isosorbide mononitrate  30 mg Oral Daily  . latanoprost  1 drop Both Eyes QHS  . levothyroxine  137 mcg Oral QAC breakfast  . losartan  50 mg Oral Daily  . methylPREDNISolone (SOLU-MEDROL) injection  60 mg Intravenous Once  . polyvinyl alcohol  1 drop Both Eyes QID  . sertraline  50 mg Oral Daily  . silver sulfADIAZINE   Topical Daily  . sodium chloride flush  3 mL Intravenous Q12H  . SYSTANE NIGHTTIME  1 application Ophthalmic QHS  . Warfarin - Pharmacist Dosing Inpatient   Does not apply q1800    Continuous Infusions:   PRN Meds: acetaminophen, albuterol,  hydrALAZINE, ondansetron **OR** ondansetron (ZOFRAN) IV  Physical Exam          Vital Signs: BP (!) 145/67 (BP Location: Left Arm)   Pulse 67   Temp 97.6 F (36.4 C) (Oral)   Resp 18   Ht 5\' 6"  (1.676 m)   Wt 66 kg (145 lb 8.1 oz)   SpO2 93%   BMI 23.48 kg/m  SpO2: SpO2: 93 % O2 Device: O2 Device: Not Delivered O2 Flow Rate:    Intake/output summary:  Intake/Output Summary (Last 24 hours) at 08/18/16 1318 Last data filed at 08/18/16 0600  Gross per 24 hour  Intake             1680 ml  Output                0 ml  Net             1680 ml   LBM: Last BM Date: 08/13/16 Baseline Weight: Weight: 68.3 kg (150 lb 9.2 oz) Most recent weight: Weight: 66 kg (145 lb 8.1 oz)       Palliative Assessment/Data: PPS: 50%      Patient Active Problem List   Diagnosis Date Noted  . Hypokalemia 08/15/2016  . COPD (chronic obstructive pulmonary disease) (HCC) 08/12/2016  . COPD exacerbation (HCC)   . Atherosclerosis of native arteries of the extremities with gangrene (HCC) 07/28/2016  . Gangrene (HCC) 07/28/2016  . Calcium channel blocker adverse reaction 06/08/2016  . Elevated TSH   . Aspiration pneumonitis (HCC)   . Other specified hypothyroidism   . Acute respiratory failure with hypoxia (HCC) 05/22/2016  . HCAP (healthcare-associated pneumonia)   . Acute on chronic congestive heart failure (HCC)   . CHF (NYHA class IV, ACC/AHA stage D) (HCC)   . Advance care planning   . Palliative care by specialist   . Shortness of breath   . Pressure injury of skin 03/11/2016  . PEG (percutaneous endoscopic gastrostomy) status (HCC) 03/10/2016  . Acute on chronic diastolic congestive heart failure (HCC) 03/02/2016  . Encounter for therapeutic drug monitoring 02/16/2016  . Sepsis (HCC) 02/08/2016  . UTI (urinary tract infection) 02/08/2016  . Warfarin anticoagulation   . Chronic seasonal allergic rhinitis   . Sinus pain   . Acute renal insufficiency 01/26/2016  . Acute deep vein  thrombosis (DVT) of left lower extremity (HCC)   . Acute respiratory failure (HCC)   . Respiratory alkalosis   . Atrial flutter (HCC) 01/24/2016  . Chronic diastolic CHF (congestive heart failure) (HCC) 01/24/2016  . Infection 01/24/2016  . Bilateral leg edema 12/29/2015  . Hypothyroidism due to medication   . Weakness   . Pyloric stenosis   . Pressure ulcer 09/15/2015  . Hyperkalemia 09/12/2015  . Acute osteomyelitis of toe of right foot (HCC)   . PVD (peripheral vascular disease) (HCC)   . Adjustment disorder with anxiety 08/29/2015  . Diabetic ulcer of toe of right foot associated with type 2 diabetes mellitus, with necrosis of muscle (HCC)   . Diverticulitis of large intestine without perforation or abscess without bleeding   . Colitis 08/26/2015  . Acute on chronic diastolic CHF (congestive heart failure), NYHA class 4 (HCC) 08/02/2015  . Esophageal candidiasis (HCC)   . Goals of care, counseling/discussion   . Dyspnea   . SOB (shortness of breath)   . Acute renal failure superimposed on stage 4 chronic kidney disease (HCC)   . Encounter for long-term (current) use of other high-risk medications   . Respiratory distress 07/06/2015  . Dysphagia 07/05/2015  . Asthma with allergic rhinitis 06/04/2015  . Hypertensive heart disease 03/18/2015  . Malnutrition of moderate degree 01/28/2015  . Syncope 01/27/2015  . Fever, unspecified 01/22/2015  . Symptomatic bradycardia 01/21/2015  . Hypotension due to drugs 01/21/2015  . Palliative care encounter   . DNR (do not resuscitate) discussion   . Protein-calorie malnutrition,  severe 01/12/2015  . Elevated troponin 01/12/2015  . Essential hypertension 01/12/2015  . AP (abdominal pain) 01/10/2015  . Cerebrovascular disease 07/14/2014  . Diabetic polyneuropathy associated with type 2 diabetes mellitus (HCC) 07/14/2014  . Hyperlipidemia 07/14/2014  . Gastric outlet obstruction 05/21/2014  . Unstable gait 04/29/2014  . Hyponatremia  03/23/2014  . Acute encephalopathy 03/22/2014  . Slurred speech   . Gastroparesis   . Slow transit constipation 02/03/2014  . Carotid stenosis 02/03/2014  . Demand ischemia (HCC) 01/21/2014  . Gastric bezoar 01/20/2014  . Anemia of chronic disease 01/18/2014  . Chronic kidney insufficiency, stage 3 (moderate) 01/18/2014  . Depression 09/29/2013  . Essential hypertension, benign 09/22/2013  . GERD (gastroesophageal reflux disease) 09/22/2013  . Dyslipidemia 09/22/2013  . Diabetes mellitus type 2 in nonobese (HCC) 09/22/2013  . Persistent atrial fibrillation (HCC) 09/22/2013  . CVA (cerebral vascular accident) (HCC) 09/22/2013  . Diabetic neuropathy (HCC) 09/22/2013  . Hypothyroidism 09/22/2013  . Chronic gastrojejunal ulcer with perforation (HCC) 09/07/2013  . Accelerated hypertension 09/07/2013  . Systemic lupus erythematosus (SLE) inhibitor 09/07/2013  . Other specified abnormal immunological findings in serum 09/07/2013    Palliative Care Assessment & Plan   Patient Profile: 83 y.o. female  with past medical history of COPD, CAD, HTN, CKD (III), dementia (mild), G-tube placement for gastric outlet  admitted on 08/12/2016 with dyspnea. Workup reveals CHF exacerbation. She has also been found to have gangrenous toes for which she has declined amputation. Palliative medicine consulted per daughter, [name]'s request.    Assessment/Recommendations/Plan   Patient is aware that she has multiple chronic illnesses that are going to worsen and likely require additional hospitalizations for management. As long as the hospitalizations are maintaining her current quality of life- she is walking, caring for herself, toileting on her own- she wishes to continue this level of care. She states her GOC that if she should become dependent for ADL's or otherwise a burden on her family, she would then want to transition care to Hospice.   Recommend outpatient palliative to follow for assistance with  symptom management of progressing gangrene, COPD, CHF and continued GOC conversations and monitoring for when transition to hospice will likely be necessary.  Goals of Care and Additional Recommendations:  Limitations on Scope of Treatment: Full Scope Treatment  Code Status:  DNR  Prognosis:   Unable to determine  Discharge Planning:  Home with Palliative Services  Care plan was discussed with patient and her daughterMaureen Ralphs.  Thank you for allowing the Palliative Medicine Team to assist in the care of this patient.   Time In: 1300 Time Out: 1315 Total Time 15 minutes Prolonged Time Billed No      Greater than 50%  of this time was spent counseling and coordinating care related to the above assessment and plan.  Ocie Bob, AGNP-C Palliative Medicine   Please contact Palliative Medicine Team phone at [phone] for questions and concerns.

## 2016-08-18 NOTE — Progress Notes (Signed)
[id number]/ETKKOE Davis,BSN,RN3,[phone]/tct-Stacie Stien for referral for outpt palliative care in the home.

## 2016-08-18 NOTE — Discharge Summary (Addendum)
Discharge Summary  Shannon [name] ION:[id number] DOB: 01-02-1924  PCP: Kirt Boys, DO  Admit date: 08/12/2016 Discharge date: 08/18/2016  Time spent: >51mins  Recommendations for Outpatient Follow-up:  1. F/u with PMD within a week  for hospital discharge follow up, repeat cbc/bmp at follow up 2. Outpatient follow up with palliative care  Discharge Diagnoses:  Active Hospital Problems   Diagnosis Date Noted  . Hypokalemia 08/15/2016  . COPD (chronic obstructive pulmonary disease) (HCC) 08/12/2016  . PEG (percutaneous endoscopic gastrostomy) status (HCC) 03/10/2016  . SOB (shortness of breath)   . Chronic kidney insufficiency, stage 3 (moderate) 01/18/2014  . Depression 09/29/2013  . Diabetes mellitus type 2 in nonobese (HCC) 09/22/2013  . Hypothyroidism 09/22/2013  . Essential hypertension, benign 09/22/2013  . Dyslipidemia 09/22/2013    Resolved Hospital Problems   Diagnosis Date Noted Date Resolved  No resolved problems to display.    Discharge Condition: stable  Diet recommendation:  Continue nightly tube feeds with glucerna 1.2cal  at 55/hr from 9pm to 9am.  Diet recommendations: Nectar-thick liquid (full liquid) Liquids provided via: Straw Medication Administration: Whole meds with puree Supervision: Patient able to self feed;Intermittent supervision to cue for compensatory strategies Compensations: Slow rate;Small sips/bites;Follow solids with liquid;Minimize environmental distractions Postural Changes and/or Swallow Maneuvers: Seated upright 90 degrees  Filed Weights   08/16/16 0416 08/17/16 0621 08/18/16 0703  Weight: 65.4 kg (144 lb 2.9 oz) 66 kg (145 lb 8.1 oz) 66 kg (145 lb 8.1 oz)    History of present illness:  Referring MD/NP/PA: Dr. Karma Ganja  PCP: Kirt Boys, DO   Patient coming from: home   Chief Complaint: dyspnea  HPI: Shannon [name] is a 83 y.o. female with known COPD, CAD, HTN, CKD stage III, dementia, presented to [facility] ED with main  concern of several days duration of progressively worsening dyspnea, initially present with exertion but has progressed to dyspnea at rest for the past 24 hours, associated with wheezing, non productive cough, chest tightness that occurs with coughing spells, poor oral intake., Please note pt is unable to provide much of the history due to dementia and family at bedside able to provide some information.   ED Course: Pt initially with significant dyspnea but improved with nebulizer treatments and solumedrol. Currently VSS, TRH asked to admit for further evaluation.   Hospital Course:  Active Problems:   Essential hypertension, benign   Dyslipidemia   Diabetes mellitus type 2 in nonobese (HCC)   Hypothyroidism   Depression   Chronic kidney insufficiency, stage 3 (moderate)   SOB (shortness of breath)   PEG (percutaneous endoscopic gastrostomy) status (HCC)   COPD (chronic obstructive pulmonary disease) (HCC)   Hypokalemia   Acute Exacerbation of COPD (baseline not o2 dependent, not steroid dependent)  with Hx of Asthma, recurrent aspiration pneumonia -she presented with wheezing, cough, dyspnea, respiratory viral panel negative, -she is treated with IV Azithromycin for COPD since admission, abx changed to oral augmentin to cover aspiration pna ( though pcn listed at allergy, she has been on amoxicillin and augmentin repeatedly in the past), continue guaifenesin, nebs, steroids --cxr  On 5/14 showed Mediastinum hilar structures are normal. Cardiomegaly. Low lung volumes with bibasilar atelectasis. Left base infiltrate cannot be excluded. Tiny left pleural effusion cannot be excluded. No pneumothorax. -cxr on 5/16 1. Low lung volumes with interim improvement of aeration of left lung base. No infiltrate noted. 2. Stable cardiomegaly. No pulmonary venous congestion. -SLP to Evaluate and recommended Nectar Thick Liquids (Pt has prior swallow  study done 05/2016 with recommendations for nectar liquids  unless chin tuck can be fully completed due to silent aspiration of thin. 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 -change tube feed to glucerna 1.2 cal, taper steroids, continue adjust insulin  HTN --norvasc discontinued due to tendency to have lower extremity edema --she is discharged on coreg, imdur/losartan/lasix,    Hypokalemia -repleted   CKD stage III -Cr is at baseline  Gastric Stenosis/Outlet Obstruction s/p PEG --SLP to Evaluate and recommended Nectar Thick Liquids -discontinue Osmolite 1000 mL due to hyperglycemia, change to glucerna .12 cal at 55cc/hr from 9pm to 9am,  w Nepro 237 mL po q24h, Prostat 30 mL per tube BID  Hx of DVT - Continue Coumadin per Pharmacy Dosing   Chronic Normocytic Anemia -hgb baseline around 8-9 -No S/Sx of Bleeding   Hyperlipidemia -C/w Atorvastatin 10 mg po Daily  Hypothyroidism -C/w Levothyroxine 137 mcg po Daily  Depression -C/w Sertraline 50 mg po Daily  Left Foot Second Toe Necrosis -WOC Nurse Consultation appreciated; Patient would like to see WOC nurse again today.  -she is discharged on Clindamycin 300 mg po q8h for 30days  start from 4/27, now on augmentin -, per patient she does not want to have surgery , daughter is requesting palliative care , community palliative care continue to follow.  DVT prophylaxis: Anticoagulated with Coumadin  Code Status: DO NOT RESUSCITATE Family Communication: Discussed with Daugther at bedside Disposition Plan: Home Health PT , RN, discharge on 5/18  Consultants:   Palliative care   Procedures: None   Antimicrobials:            Anti-infectives    Start     Dose/Rate Route Frequency Ordered Stop   08/16/16 1800  amoxicillin-clavulanate (AUGMENTIN) 875-125 MG per tablet 1 tablet     1 tablet Oral Every 12 hours 08/16/16 1730     08/14/16 1100  clindamycin (CLEOCIN) capsule 300 mg  Status:  Discontinued     300 mg Oral Every 8 hours 08/14/16 1009 08/16/16 1730   08/13/16 1000  azithromycin (ZITHROMAX) 500 mg in dextrose 5 % 250 mL IVPB  Status:  Discontinued     500 mg 250 mL/hr over 60 Minutes Intravenous Every 24 hours 08/13/16 0943 08/16/16 1729      Discharge Exam: BP (!) 145/67 (BP Location: Left Arm)   Pulse 67   Temp 97.6 F (36.4 C) (Oral)   Resp 18   Ht 5\' 6"  (1.676 m)   Wt 66 kg (145 lb 8.1 oz)   SpO2 93%   BMI 23.48 kg/m   General: frail, chronically ill, on room air, aaox3,  Cardiovascular: RRR Respiratory: scattered rhonchi/ wheezes Ab: peg tube in place Extremity: Left 2nd to necrotic.  Discharge Instructions You were cared for by a hospitalist during your hospital stay. If you have any questions about your discharge medications or the care you received while you were in the hospital after you are discharged, you can call the unit and asked to speak with the hospitalist on call if the hospitalist that took care of you is not available. Once you are discharged, your primary care physician will handle any further medical issues. Please note that NO REFILLS for any discharge medications will be authorized once you are discharged, as  it is imperative that you return to your primary care physician (or establish a relationship with a primary care physician if you do not have one) for your aftercare needs so that they can reassess your need for medications and monitor your lab values.  Discharge Instructions    Discharge instructions    Complete by:  As directed    Aspiration precaution Diet recommendations: Nectar-thick liquid (full liquid) Liquids provided via: Straw Medication Administration: Whole meds with puree Supervision: Patient able to self feed;Intermittent supervision to cue for compensatory strategies Compensations: Slow rate;Small sips/bites;Follow solids with liquid;Minimize environmental distractions Postural Changes and/or Swallow Maneuvers: Seated upright 90 degrees   Face-to-face encounter (required for Medicare/Medicaid patients)    Complete by:  As directed    I Maris Abascal certify that this patient is under my care and that I, or a nurse practitioner or physician's assistant working with me, had a face-to-face encounter that meets the physician face-to-face encounter requirements with this patient on 08/18/2016. The encounter with the patient was in whole, or in part for the following medical condition(s) which is the primary reason for home health care (List medical condition): FTT   The encounter with the patient was in whole, or in part, for the following medical condition, which is the primary reason for home health care:  FTT   I certify that, based on my findings, the following services are medically necessary home health services:   Nursing Physical therapy Speech language pathology     Reason for Medically Necessary Home Health Services:  Skilled Nursing- Change/Decline in Patient Status   My clinical findings support the need for the above services:  Shortness of breath with activity   Further, I certify that my clinical findings support that this patient is homebound due to:  Unsafe ambulation due to  balance issues   Home Health    Complete by:  As directed    To provide the following care/treatments:   PT RN     Increase activity slowly    Complete by:  As directed      Allergies as of 08/18/2016      Reactions   Penicillins Itching, Rash, Other (See Comments)   Tolerated unasyn, zosyn & cephalosporins in the past.  Has patient had a PCN reaction causing immediate rash, facial/tongue/throat swelling, SOB or lightheadedness with hypotension: Yes Has patient had a PCN reaction causing severe rash involving mucus membranes or skin necrosis: Unk Has patient had a PCN reaction that required hospitalization: Unk Has patient had a PCN reaction occurring within the last 10 years: Unk If all of the above answers are "NO", then may proceed with Cephalosporins   Other    Patient tolerates amoxicillin and cephalosporins      Medication List    STOP taking these medications   amLODipine 10 MG tablet Commonly known as:  NORVASC   clindamycin 300 MG capsule Commonly known as:  CLEOCIN  TAKE these medications   acetaminophen 500 MG tablet Commonly known as:  TYLENOL Take 1,000 mg by mouth every 6 (six) hours as needed (pain).   AMINO ACIDS-PROTEIN HYDROLYS PO Take 30 mLs by mouth 2 (two) times daily.   amoxicillin-clavulanate 875-125 MG tablet Commonly known as:  AUGMENTIN Take 1 tablet by mouth every 12 (twelve) hours.   atorvastatin 10 MG tablet Commonly known as:  LIPITOR TAKE ONE TABLET BY MOUTH ONCE DAILY   b complex vitamins tablet Take 1 tablet by mouth daily.   calcium-vitamin D 500-200 MG-UNIT tablet Commonly known as:  OSCAL WITH D Take 1 tablet by mouth daily with breakfast.   carvedilol 6.25 MG tablet Commonly known as:  COREG Take 1 tablet (6.25 mg total) by mouth 2 (two) times daily.   chlorhexidine 0.12 % solution Commonly known as:  PERIDEX Use as directed 15 mLs in the mouth or throat 2 (two) times daily.   feeding supplement (GLUCERNA 1.2 CAL)  Liqd Glucerna 1.2 @ 55 mL/hr from 2100-0900 What changed:  how much to take  how to take this  when to take this  additional instructions   fluticasone 50 MCG/ACT nasal spray Commonly known as:  FLONASE Place 2 sprays into both nostrils daily.   free water Soln Place 230 mLs into feeding tube 3 (three) times daily.   furosemide 20 MG tablet Commonly known as:  LASIX Take 1 tablet (20 mg total) by mouth daily.   gabapentin 300 MG capsule Commonly known as:  NEURONTIN 1 tablet by mouth three times daily, may have additional dose every 8 hours as needed pain What changed:  how much to take  how to take this  when to take this  additional instructions   insulin aspart 100 UNIT/ML FlexPen Commonly known as:  NOVOLOG FLEXPEN Before each meal 3 times a day, 140-199 - 2 units, 200-250 - 4 units, 251-299 - 6 units,  300-349 - 8 units,  350 or above 10 units. Insulin PEN if approved, provide syringes and needles if needed.   Insulin Glargine 100 UNIT/ML Solostar Pen Commonly known as:  LANTUS SOLOSTAR Inject 5 Units into the skin daily at 10 pm.   ipratropium-albuterol 0.5-2.5 (3) MG/3ML Soln Commonly known as:  DUONEB Take 3 mLs by nebulization every 6 (six) hours as needed (sob). Use 3 times daily x 4 days then every 6 hours as needed.   isosorbide mononitrate 30 MG 24 hr tablet Commonly known as:  IMDUR Take 30 mg by mouth daily.   latanoprost 0.005 % ophthalmic solution Commonly known as:  XALATAN Place 1 drop into both eyes at bedtime.   levalbuterol 45 MCG/ACT inhaler Commonly known as:  XOPENEX HFA Inhale 2 puffs into the lungs every 6 (six) hours as needed for wheezing or shortness of breath.   levothyroxine 137 MCG tablet Commonly known as:  SYNTHROID, LEVOTHROID TAKE 1 TABLET (137 MCG TOTAL) BY MOUTH DAILY BEFORE BREAKFAST.   losartan 50 MG tablet Commonly known as:  COZAAR Take 50 mg by mouth daily.   multivitamin with minerals Tabs tablet Take 1  tablet by mouth daily.   Polyethyl Glycol-Propyl Glycol 0.4-0.3 % Soln Place 1 drop into both eyes 4 (four) times daily.   polyethylene glycol packet Commonly known as:  MIRALAX / GLYCOLAX Take 17 g by mouth daily as needed for mild constipation.   predniSONE 10 MG tablet Commonly known as:  DELTASONE Label  & dispense according to the schedule below. 6 Pills  PO on day one then, 5 Pills PO on day two, 4 Pills PO on day three, 3 Pills PO on day four, 2 Pills PO on day five, 1 Pills PO on day six,  then STOP.   RESOURCE THICKENUP CLEAR Powd Take 120 g by mouth as needed (use to thicken liquids.).   RESTASIS 0.05 % ophthalmic emulsion Generic drug:  cycloSPORINE USE 1 DROP INTO BOTH EYES TWICE DAILY   sertraline 100 MG tablet Commonly known as:  ZOLOFT TAKE ONE TABLET BY MOUTH ONCE DAILY FOR DEPRESSION   warfarin 2.5 MG tablet Commonly known as:  COUMADIN TAKE AS DIRECTED BY COUMADIN CLINIC. What changed:  See the new instructions.      Allergies  Allergen Reactions  . Penicillins Itching, Rash and Other (See Comments)    Tolerated unasyn, zosyn & cephalosporins in the past.  Has patient had a PCN reaction causing immediate rash, facial/tongue/throat swelling, SOB or lightheadedness with hypotension: Yes Has patient had a PCN reaction causing severe rash involving mucus membranes or skin necrosis: Unk Has patient had a PCN reaction that required hospitalization: Unk Has patient had a PCN reaction occurring within the last 10 years: Unk If all of the above answers are "NO", then may proceed with Cephalosporins  . Other     Patient tolerates amoxicillin and cephalosporins   Follow-up Information    [facility].   Why:  HH nursing, physical therapy Contact information: [address] [place] [address] [phone]        Kirt Boys, DO Follow up in 1 week(s).   Specialty:  Internal Medicine Why:  hospital discharge follow  up Contact information: [address] [place] [address] [phone]        [facility] Schedule an appointment as soon as possible for a visit on 08/18/2016.   Specialty:  Hospice and Palliative Medicine Why:  Call for hospital follow up appointment Contact information: [address] [place] [address] [phone]            The results of significant diagnostics from this hospitalization (including imaging, microbiology, ancillary and laboratory) are listed below for reference.    Significant Diagnostic Studies: Dg Chest 2 View  Result Date: 08/12/2016 CLINICAL DATA:  Shortness of breath and cough today. EXAM: CHEST  2 VIEW COMPARISON:  06/18/2016 and prior chest radiograph FINDINGS: Cardiomegaly and mild pulmonary vascular congestion noted. Elevation of the right hemidiaphragm is again identified. There is no evidence of focal airspace disease, pulmonary edema, suspicious pulmonary nodule/mass, pleural effusion, or pneumothorax. No acute bony abnormalities are identified. IMPRESSION: Cardiomegaly with mild pulmonary vascular congestion. Electronically Signed   By: Harmon Pier M.D.   On: 08/12/2016 14:57   Mr Foot Left Wo Contrast  Result Date: 07/29/2016 CLINICAL DATA:  Diabetic left foot wound.  Second toe appears block. EXAM: MRI OF THE LEFT FOOT WITHOUT CONTRAST TECHNIQUE: Multiplanar, multisequence MR imaging of the left forefoot was performed. No intravenous contrast was administered. COMPARISON:  None. FINDINGS: Bones/Joint/Cartilage Marrow signal abnormality consistent with bone marrow edema is identified involving the base of the second proximal phalanx. This overlies an area of plantar soft tissue cellulitis with ulceration. Despite lack of IV contrast there is suspicion for a subcutaneous soft tissue abscess along the plantar aspect of the left second toe measuring 6 x 3 x 7 mm characterized by irregular hypointensity within the area of  inflamed soft tissue. Similar plantar soft tissue edema and/or cellulitis  is seen along the plantar aspect of the fourth and fifth digits. Hallux valgus with osteoarthritic joint space narrowing is noted of the great toe. DIP and PIP joint space narrowing as well as interphalangeal joint space narrowing of the great toe are identified. Ligaments Noncontributory Muscles and Tendons No intramuscular hemorrhage, hematoma or atrophy. The extensor and flexor tendons of the forefoot are grossly intact without sickle-cell abnormalities noted. Soft tissues Diffuse soft tissue swelling/cellulitis of the forefoot especially along the dorsum. IMPRESSION: 1. Marrow signal abnormality involving the base of the second proximal phalanx overlying an area of soft tissue ulceration, inflammation and possible tiny subcutaneous abscess measuring 6 x 3 x 7 mm. This raises concern for osteomyelitis though specificity is somewhat limited by lack of IV contrast. 2. Plantar soft tissue inflammation also noted along the base of the fourth and fifth digits without adjacent marrow signal abnormalities. 3. Hallux valgus with osteoarthritic joint space narrowing of the great toe. Lesser degree of the joint space narrowing is identified of the PIP and DIP joints of the second through fifth digits. Electronically Signed   By: Tollie Eth M.D.   On: 07/29/2016 01:16   Dg Chest Port 1 View  Result Date: 08/16/2016 CLINICAL DATA:  Shortness of breath. EXAM: PORTABLE CHEST 1 VIEW COMPARISON:  08/14/2016. FINDINGS: Mediastinum hilar structures are normal. Stable cardiomegaly. Normal pulmonary vascularity. Low lung volumes, interval improvement of aeration of left lung base. No infiltrate noted. No pleural effusion or pneumothorax. Thoracic spine scoliosis and degenerative change IMPRESSION: 1. Low lung volumes with interim improvement of aeration of left lung base. No infiltrate noted. 2. Stable cardiomegaly.  No pulmonary venous congestion.  Electronically Signed   By: Maisie Fus  Register   On: 08/16/2016 06:54   Dg Chest Port 1 View  Result Date: 08/14/2016 CLINICAL DATA:  Shortness of breath.  Cough. EXAM: PORTABLE CHEST 1 VIEW COMPARISON:  08/12/2016. FINDINGS: Mediastinum hilar structures are normal. Cardiomegaly. Low lung volumes with bibasilar atelectasis. Left base infiltrate cannot be excluded. Tiny left pleural effusion cannot be excluded. No pneumothorax. IMPRESSION: 1. Low lung volumes with bibasilar atelectasis. Left base infiltrate cannot be excluded. Small left pleural effusion cannot be excluded. 2. Stable cardiomegaly. Electronically Signed   By: Maisie Fus  Register   On: 08/14/2016 06:41   Dg Foot Complete Left  Result Date: 07/28/2016 CLINICAL DATA:  83 y/o F; increasing foot pain in the plantar and anterior site of the foot. EXAM: LEFT FOOT - COMPLETE 3+ VIEW COMPARISON:  08/26/2014 foot radiographs FINDINGS: Bones are demineralized. Hallux valgus. Lisfranc alignment is maintained. Pes planus. No acute fracture identified. Plantar calcaneal enthesophyte. Vascular calcifications. IMPRESSION: No acute fracture identified. Hallux valgus. Pes planus. Plantar calcaneal enthesophyte. Electronically Signed   By: Mitzi Hansen M.D.   On: 07/28/2016 21:10    Microbiology: Recent Results (from the past 240 hour(s))  Respiratory Panel by PCR     Status: None   Collection Time: 08/13/16 11:37 AM  Result Value Ref Range Status   Adenovirus NOT DETECTED NOT DETECTED Final   Coronavirus 229E NOT DETECTED NOT DETECTED Final   Coronavirus HKU1 NOT DETECTED NOT DETECTED Final   Coronavirus NL63 NOT DETECTED NOT DETECTED Final   Coronavirus OC43 NOT DETECTED NOT DETECTED Final   Metapneumovirus NOT DETECTED NOT DETECTED Final   Rhinovirus / Enterovirus NOT DETECTED NOT DETECTED Final   Influenza A NOT DETECTED NOT DETECTED Final   Influenza B NOT DETECTED NOT DETECTED Final   Parainfluenza Virus 1 NOT DETECTED  NOT DETECTED  Final   Parainfluenza Virus 2 NOT DETECTED NOT DETECTED Final   Parainfluenza Virus 3 NOT DETECTED NOT DETECTED Final   Parainfluenza Virus 4 NOT DETECTED NOT DETECTED Final   Respiratory Syncytial Virus NOT DETECTED NOT DETECTED Final   Bordetella pertussis NOT DETECTED NOT DETECTED Final   Chlamydophila pneumoniae NOT DETECTED NOT DETECTED Final   Mycoplasma pneumoniae NOT DETECTED NOT DETECTED Final    Comment: Performed at [facility], [address] [place], [place] [address]  Culture, expectorated sputum-assessment     Status: None   Collection Time: 08/17/16  3:10 PM  Result Value Ref Range Status   Specimen Description EXPECTORATED SPUTUM  Final   Special Requests NONE  Final   Sputum evaluation   Final    Sputum specimen not acceptable for testing.  Please recollect.   Gram Stain Report Called to,Read Back By and Verified With: JOHNSON,A @ 1623 ON [id number] BY POTEAT,S    Report Status 08/17/2016 FINAL  Final     Labs: Basic Metabolic Panel:  Recent Labs Lab 08/14/16 0505 08/15/16 0545 08/16/16 0616 08/17/16 0544 08/18/16 0517  NA 138 136 141 140 135  K 3.8 3.3* 4.2 3.7 4.0  CL 101 102 107 104 101  CO2 25 23 25 27 24   GLUCOSE 238* 451* 350* 81 154*  BUN 55* 64* 54* 47* 49*  CREATININE 1.07* 1.11* 1.04* 0.85 0.96  CALCIUM 9.8 9.3 9.4 9.3 9.1  MG 2.1 2.1 1.9  --  1.9  PHOS 3.7 2.4* 2.2*  --   --    Liver Function Tests:  Recent Labs Lab 08/14/16 0505 08/15/16 0545 08/16/16 0616  AST 59* 51* 29  ALT 33 40 39  ALKPHOS 59 62 61  BILITOT 0.6 0.5 0.2*  PROT 8.1 7.8 7.7  ALBUMIN 3.2* 3.1* 3.0*   No results for input(s): LIPASE, AMYLASE in the last 168 hours. No results for input(s): AMMONIA in the last 168 hours. CBC:  Recent Labs Lab 08/14/16 0505 08/15/16 0545 08/16/16 0616 08/17/16 0544 08/18/16 0517  WBC 16.6* 12.1* 10.8* 11.6* 10.7*  NEUTROABS 15.8* 11.0* 9.7* 8.8* 8.4*  HGB 9.0* 8.9* 9.1* 9.6* 10.3*  HCT 27.0* 27.1* 28.2* 29.4* 31.1*   MCV 87.4 88.3 89.0 89.4 88.1  PLT 371 348 351 341 383   Cardiac Enzymes: No results for input(s): CKTOTAL, CKMB, CKMBINDEX, TROPONINI in the last 168 hours. BNP: BNP (last 3 results)  Recent Labs  05/22/16 0102 06/08/16 1523 08/12/16 1400  BNP 271.0* 1,268.6* 92.2    ProBNP (last 3 results)  Recent Labs  05/08/16 1053  PROBNP 1,132*    CBG:  Recent Labs Lab 08/17/16 2046 08/18/16 0019 08/18/16 0547 08/18/16 0727 08/18/16 1150  GLUCAP 185* 188* 135* 127* 103*       Signed:  Adrianah Prophete MD, PhD  [facility] 08/18/2016, 1:16 PM

## 2016-08-18 NOTE — Progress Notes (Signed)
CSW consulted to arrange out patient palliative care services. CSW is unable to assist with this request. RNCM has also been consulted.  Werner Lean LCSW [phone]

## 2016-08-18 NOTE — Care Management Important Message (Signed)
Important Message  Patient Details  Name: Shannon [name] MRN: [id number] Date of Birth: [birth year]   Medicare Important Message Given:  Yes    Kerin Salen 08/18/2016, [place] Message  Patient Details  Name: Shannon [name] MRN: [id number] Date of Birth: 11/28/1923   Medicare Important Message Given:  Yes    Kerin Salen 08/18/2016, 11:22 AM

## 2016-08-20 DIAGNOSIS — I130 Hypertensive heart and chronic kidney disease with heart failure and stage 1 through stage 4 chronic kidney disease, or unspecified chronic kidney disease: Secondary | ICD-10-CM | POA: Diagnosis not present

## 2016-08-20 DIAGNOSIS — E1122 Type 2 diabetes mellitus with diabetic chronic kidney disease: Secondary | ICD-10-CM | POA: Diagnosis not present

## 2016-08-20 DIAGNOSIS — N183 Chronic kidney disease, stage 3 (moderate): Secondary | ICD-10-CM | POA: Diagnosis not present

## 2016-08-20 DIAGNOSIS — L97521 Non-pressure chronic ulcer of other part of left foot limited to breakdown of skin: Secondary | ICD-10-CM | POA: Diagnosis not present

## 2016-08-20 DIAGNOSIS — F0390 Unspecified dementia without behavioral disturbance: Secondary | ICD-10-CM | POA: Diagnosis not present

## 2016-08-20 DIAGNOSIS — I5032 Chronic diastolic (congestive) heart failure: Secondary | ICD-10-CM | POA: Diagnosis not present

## 2016-08-20 DIAGNOSIS — Z931 Gastrostomy status: Secondary | ICD-10-CM | POA: Diagnosis not present

## 2016-08-20 DIAGNOSIS — Z7901 Long term (current) use of anticoagulants: Secondary | ICD-10-CM | POA: Diagnosis not present

## 2016-08-20 DIAGNOSIS — K3189 Other diseases of stomach and duodenum: Secondary | ICD-10-CM | POA: Diagnosis not present

## 2016-08-20 DIAGNOSIS — E1142 Type 2 diabetes mellitus with diabetic polyneuropathy: Secondary | ICD-10-CM | POA: Diagnosis not present

## 2016-08-20 DIAGNOSIS — J441 Chronic obstructive pulmonary disease with (acute) exacerbation: Secondary | ICD-10-CM | POA: Diagnosis not present

## 2016-08-20 DIAGNOSIS — L97523 Non-pressure chronic ulcer of other part of left foot with necrosis of muscle: Secondary | ICD-10-CM | POA: Diagnosis not present

## 2016-08-20 DIAGNOSIS — E11621 Type 2 diabetes mellitus with foot ulcer: Secondary | ICD-10-CM | POA: Diagnosis not present

## 2016-08-20 DIAGNOSIS — Z794 Long term (current) use of insulin: Secondary | ICD-10-CM | POA: Diagnosis not present

## 2016-08-20 DIAGNOSIS — I2510 Atherosclerotic heart disease of native coronary artery without angina pectoris: Secondary | ICD-10-CM | POA: Diagnosis not present

## 2016-08-20 DIAGNOSIS — Z792 Long term (current) use of antibiotics: Secondary | ICD-10-CM | POA: Diagnosis not present

## 2016-08-21 ENCOUNTER — Telehealth: Payer: Self-pay

## 2016-08-21 DIAGNOSIS — E11621 Type 2 diabetes mellitus with foot ulcer: Secondary | ICD-10-CM | POA: Diagnosis not present

## 2016-08-21 DIAGNOSIS — L97523 Non-pressure chronic ulcer of other part of left foot with necrosis of muscle: Secondary | ICD-10-CM | POA: Diagnosis not present

## 2016-08-21 DIAGNOSIS — J441 Chronic obstructive pulmonary disease with (acute) exacerbation: Secondary | ICD-10-CM | POA: Diagnosis not present

## 2016-08-21 DIAGNOSIS — I130 Hypertensive heart and chronic kidney disease with heart failure and stage 1 through stage 4 chronic kidney disease, or unspecified chronic kidney disease: Secondary | ICD-10-CM | POA: Diagnosis not present

## 2016-08-21 DIAGNOSIS — L97521 Non-pressure chronic ulcer of other part of left foot limited to breakdown of skin: Secondary | ICD-10-CM | POA: Diagnosis not present

## 2016-08-21 DIAGNOSIS — I2510 Atherosclerotic heart disease of native coronary artery without angina pectoris: Secondary | ICD-10-CM | POA: Diagnosis not present

## 2016-08-21 NOTE — Telephone Encounter (Signed)
Transition Care Management Follow-up Telephone Call  Date discharged? 08/18/2016  How have you been since you were released from the hospital? Still coughing a lot, using nebulizer as needed.   Do you understand why you were in the hospital? YES  Do you understand the discharge instructions? YES  Where were you discharged to? Home   Items Reviewed: Medications reviewed: YES Allergies reviewed: YES Dietary changes reviewed: YES Referrals reviewed: YES   Functional Questionnaire:  Activities of Daily Living (ADLs):   She states they are independent in the following:  bathing, feeding, dressing States they require assistance with the following:  transportation  Any transportation issues/concerns?: NO  Any patient concerns?  [facility], needs to go to [place].   Confirmed importance and date/time of follow-up visits scheduled YES Provider Appointment booked with Gildardo Cranker on May 30th  at 245pm   Confirmed with patient if condition begins to worsen call PCP or go to the ER.  Patient was given the office number and encouraged to call back with question or concerns.  : YES

## 2016-08-22 ENCOUNTER — Encounter: Payer: Medicare Other | Admitting: Internal Medicine

## 2016-08-22 DIAGNOSIS — L97523 Non-pressure chronic ulcer of other part of left foot with necrosis of muscle: Secondary | ICD-10-CM | POA: Diagnosis not present

## 2016-08-22 DIAGNOSIS — N183 Chronic kidney disease, stage 3 (moderate): Secondary | ICD-10-CM | POA: Diagnosis not present

## 2016-08-22 DIAGNOSIS — E1169 Type 2 diabetes mellitus with other specified complication: Secondary | ICD-10-CM | POA: Diagnosis not present

## 2016-08-22 DIAGNOSIS — Z7901 Long term (current) use of anticoagulants: Secondary | ICD-10-CM | POA: Diagnosis not present

## 2016-08-22 DIAGNOSIS — E1152 Type 2 diabetes mellitus with diabetic peripheral angiopathy with gangrene: Secondary | ICD-10-CM | POA: Diagnosis not present

## 2016-08-22 DIAGNOSIS — Z794 Long term (current) use of insulin: Secondary | ICD-10-CM | POA: Diagnosis not present

## 2016-08-22 DIAGNOSIS — L97511 Non-pressure chronic ulcer of other part of right foot limited to breakdown of skin: Secondary | ICD-10-CM | POA: Diagnosis not present

## 2016-08-22 DIAGNOSIS — S91105A Unspecified open wound of left lesser toe(s) without damage to nail, initial encounter: Secondary | ICD-10-CM | POA: Diagnosis not present

## 2016-08-22 DIAGNOSIS — D649 Anemia, unspecified: Secondary | ICD-10-CM | POA: Diagnosis not present

## 2016-08-22 DIAGNOSIS — J449 Chronic obstructive pulmonary disease, unspecified: Secondary | ICD-10-CM | POA: Diagnosis not present

## 2016-08-22 DIAGNOSIS — M1990 Unspecified osteoarthritis, unspecified site: Secondary | ICD-10-CM | POA: Diagnosis not present

## 2016-08-22 DIAGNOSIS — I130 Hypertensive heart and chronic kidney disease with heart failure and stage 1 through stage 4 chronic kidney disease, or unspecified chronic kidney disease: Secondary | ICD-10-CM | POA: Diagnosis not present

## 2016-08-22 DIAGNOSIS — S91101A Unspecified open wound of right great toe without damage to nail, initial encounter: Secondary | ICD-10-CM | POA: Diagnosis not present

## 2016-08-22 DIAGNOSIS — S91102A Unspecified open wound of left great toe without damage to nail, initial encounter: Secondary | ICD-10-CM | POA: Diagnosis not present

## 2016-08-22 DIAGNOSIS — M868X7 Other osteomyelitis, ankle and foot: Secondary | ICD-10-CM | POA: Diagnosis not present

## 2016-08-22 DIAGNOSIS — K219 Gastro-esophageal reflux disease without esophagitis: Secondary | ICD-10-CM | POA: Diagnosis not present

## 2016-08-22 DIAGNOSIS — E1122 Type 2 diabetes mellitus with diabetic chronic kidney disease: Secondary | ICD-10-CM | POA: Diagnosis not present

## 2016-08-22 IMAGING — CT CT HEAD W/O CM
1 series · 15 of 30 positions shown, 19 images · non-contrast
Comparison: CT of the head performed 12/23/2013, and MRI of the
brain performed 12/24/2013

CLINICAL DATA: Acute onset of slurred speech.  Initial encounter.

EXAM:
CT HEAD WITHOUT CONTRAST
TECHNIQUE: Contiguous axial images were obtained from the base of the skull
through the vertex without intravenous contrast.

[Series 2: headseq 4.8 h45s · axial · 0.43mm/px · z∈[-162,-33]mm · 15 of 30 slices shown, 19 images]
[im 2/30  brain]
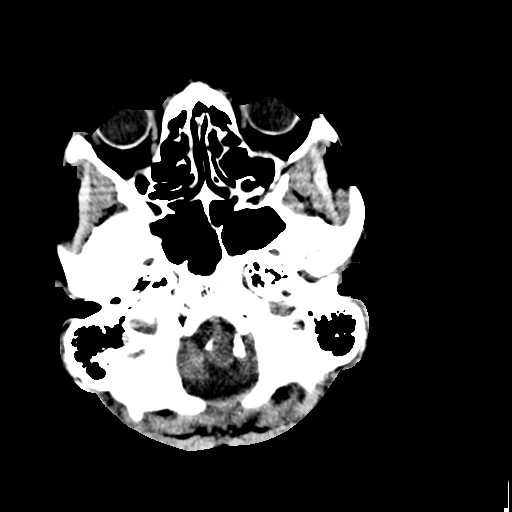
[im 2/30  bone]
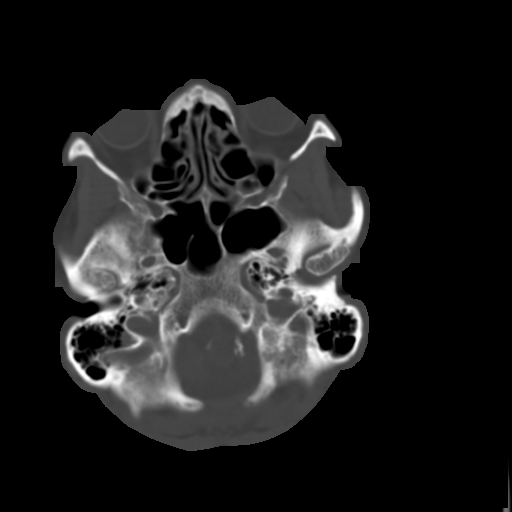
[im 4/30  brain]
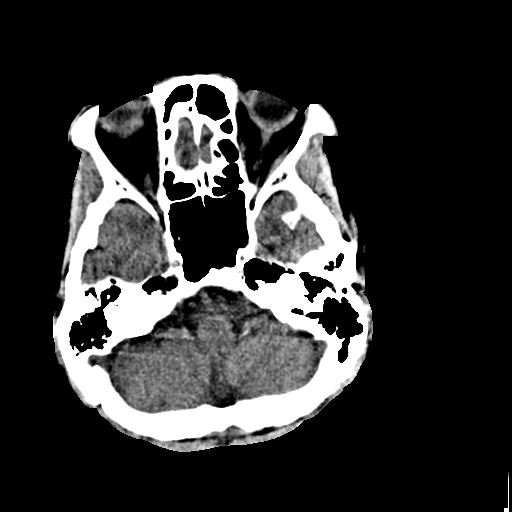
[im 6/30  brain]
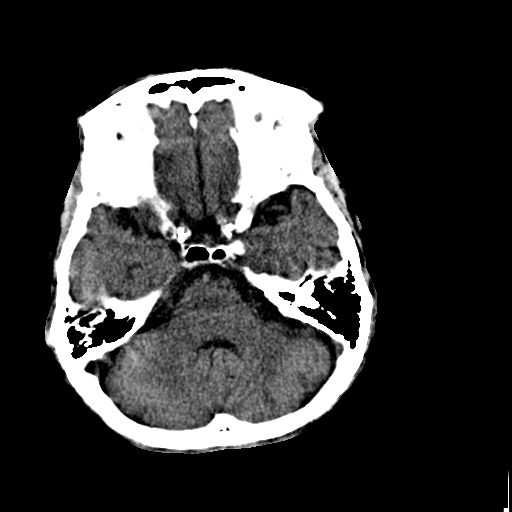
[im 8/30  brain]
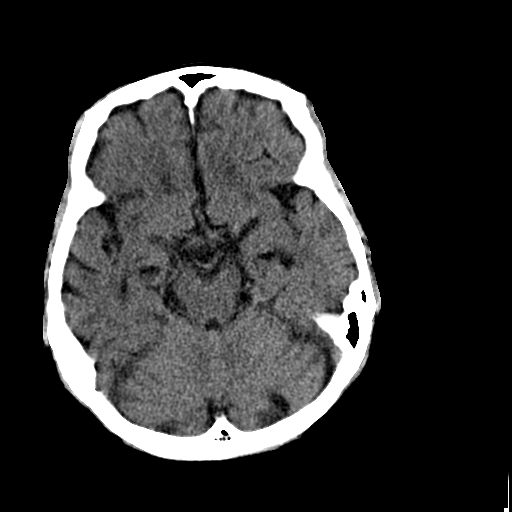
[im 10/30  brain]
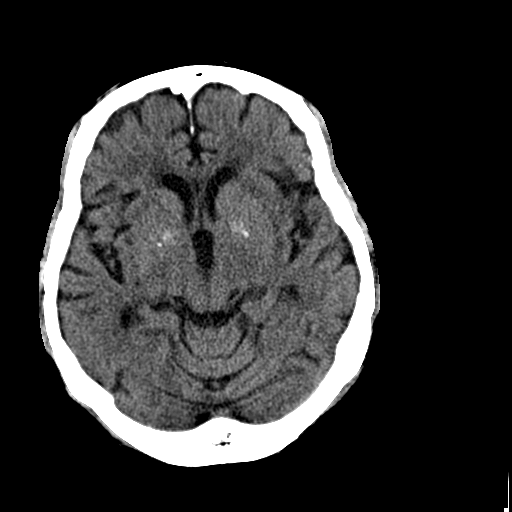
[im 10/30  bone]
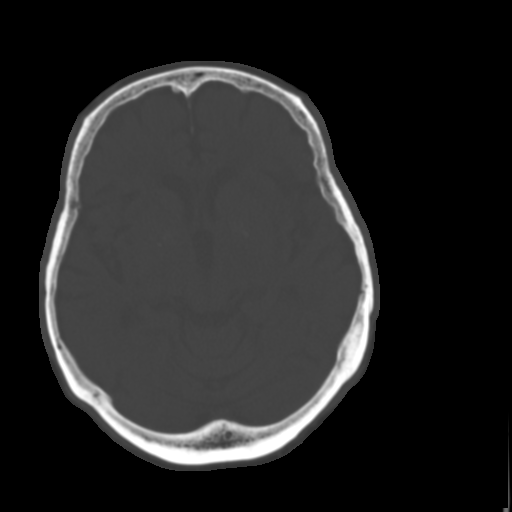
[im 12/30  brain]
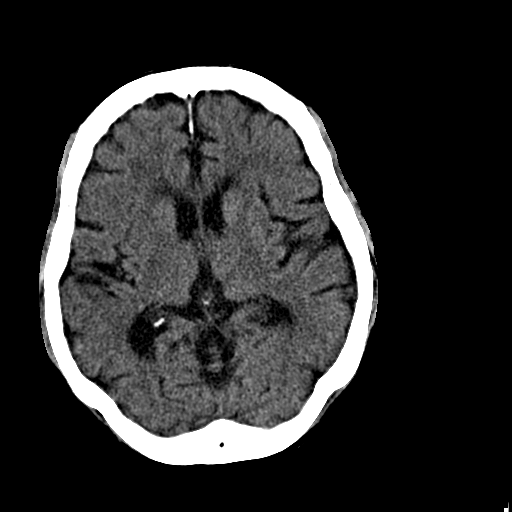
[im 14/30  brain]
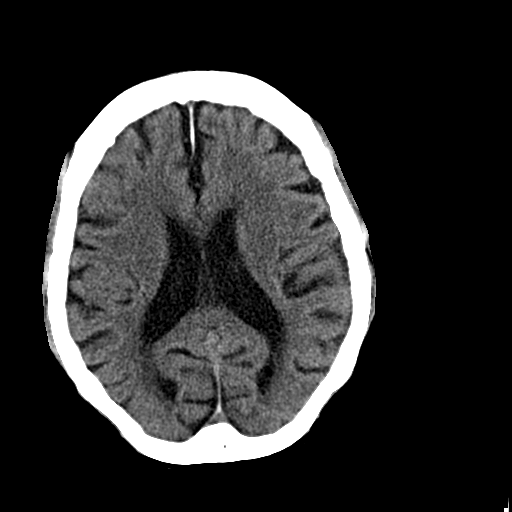
[im 16/30  brain]
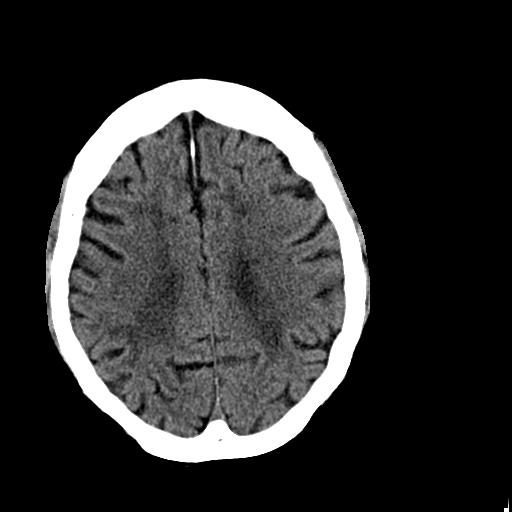
[im 17/30  brain]
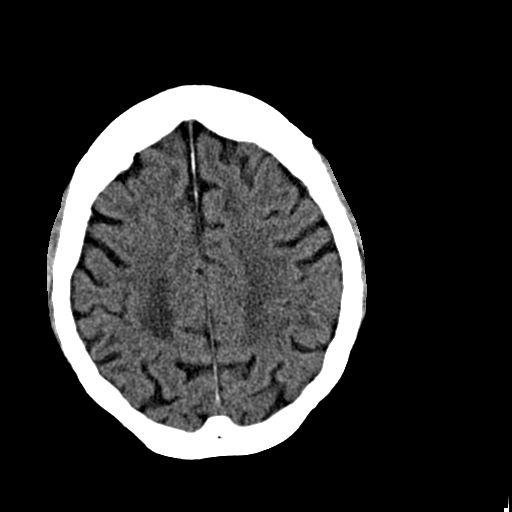
[im 17/30  bone]
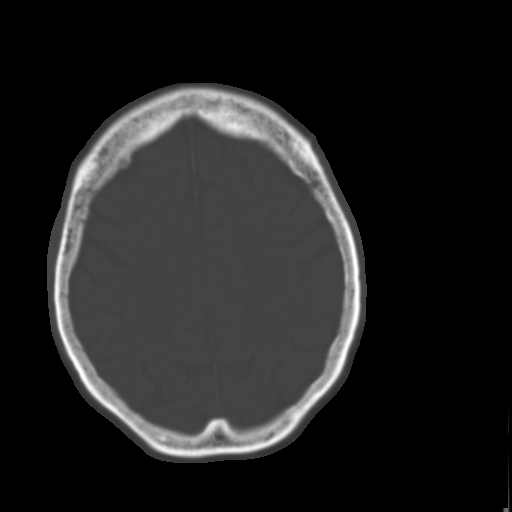
[im 19/30  brain]
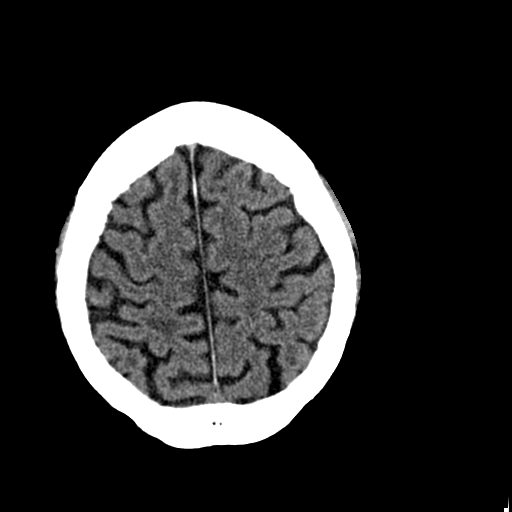
[im 21/30  brain]
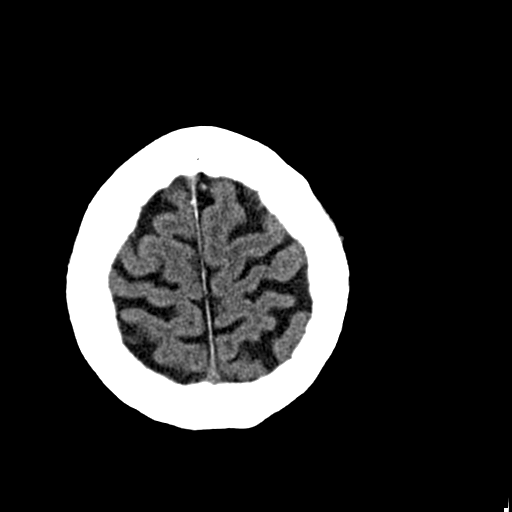
[im 23/30  brain]
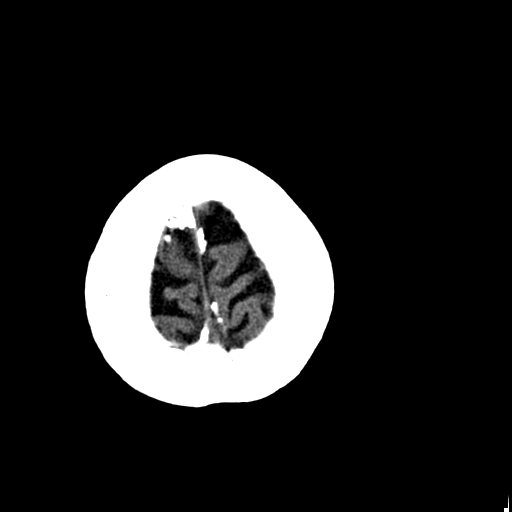
[im 25/30  brain]
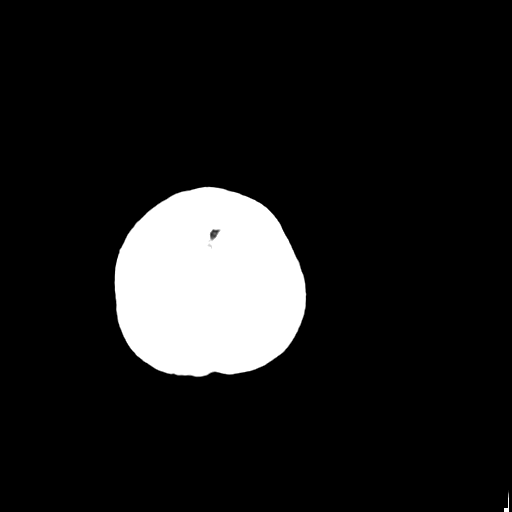
[im 25/30  bone]
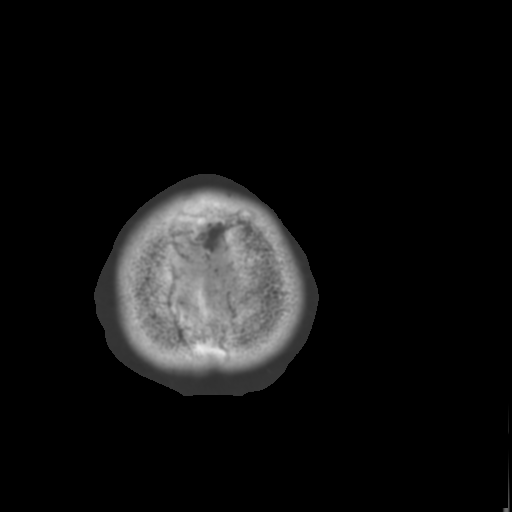
[im 27/30  brain]
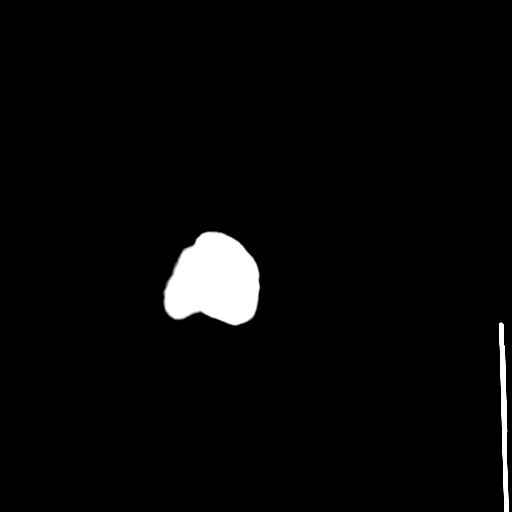
[im 29/30  brain]
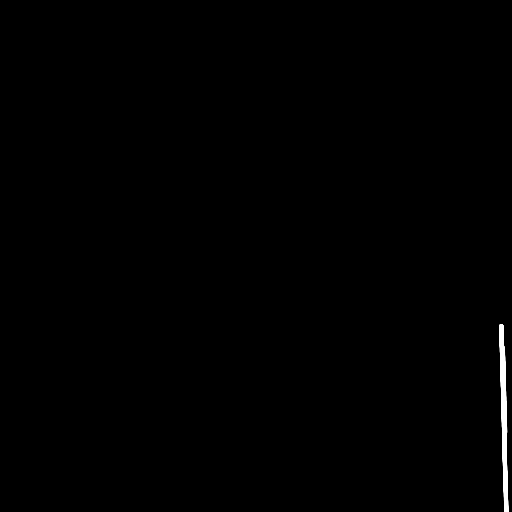

[15 of 30 positions shown; findings below may reference images not displayed]

FINDINGS: There is no evidence of acute infarction, mass lesion, or intra- or
extra-axial hemorrhage on CT.

Prominence of the ventricles and sulci reflects mild to moderate
cortical volume loss. Cerebellar atrophy is noted. Scattered
periventricular and subcortical white matter change likely reflects
small vessel ischemic microangiopathy. A chronic infarct is noted at
the left cerebellar hemisphere.

The brainstem and fourth ventricle are within normal limits. The
basal ganglia are unremarkable in appearance. The cerebral
hemispheres demonstrate grossly normal gray-white differentiation.
No mass effect or midline shift is seen.

There is no evidence of fracture; visualized osseous structures are
unremarkable in appearance. The visualized portions of the orbits
are within normal limits. The paranasal sinuses and mastoid air
cells are well-aerated. No significant soft tissue abnormalities are
seen.
IMPRESSION: 1. No acute intracranial pathology seen on CT.
2. Mild to moderate cortical volume loss and scattered small vessel
ischemic microangiopathy.
3. Chronic infarct at the left cerebellar hemisphere.

## 2016-08-23 DIAGNOSIS — J441 Chronic obstructive pulmonary disease with (acute) exacerbation: Secondary | ICD-10-CM | POA: Diagnosis not present

## 2016-08-23 DIAGNOSIS — L97523 Non-pressure chronic ulcer of other part of left foot with necrosis of muscle: Secondary | ICD-10-CM | POA: Diagnosis not present

## 2016-08-23 DIAGNOSIS — E11621 Type 2 diabetes mellitus with foot ulcer: Secondary | ICD-10-CM | POA: Diagnosis not present

## 2016-08-23 DIAGNOSIS — L97521 Non-pressure chronic ulcer of other part of left foot limited to breakdown of skin: Secondary | ICD-10-CM | POA: Diagnosis not present

## 2016-08-23 DIAGNOSIS — I2510 Atherosclerotic heart disease of native coronary artery without angina pectoris: Secondary | ICD-10-CM | POA: Diagnosis not present

## 2016-08-23 DIAGNOSIS — I130 Hypertensive heart and chronic kidney disease with heart failure and stage 1 through stage 4 chronic kidney disease, or unspecified chronic kidney disease: Secondary | ICD-10-CM | POA: Diagnosis not present

## 2016-08-23 IMAGING — MR MR HEAD W/O CM
8 of 10 series · 38 of 48 positions shown · non-contrast
Comparison: MR brain 12/24/2013.  CT head 03/22/2014.

CLINICAL DATA: Acute onset of slurred speech. Previous history of
CVA.

EXAM:
MRI HEAD WITHOUT CONTRAST
TECHNIQUE: Multiplanar, multiecho pulse sequences of the brain and surrounding
structures were obtained without intravenous contrast.

[Series 3: T1 · sagittal · 5.0mm · 0.47mm/px · 1 of 25 slices shown]
[im 1/25]
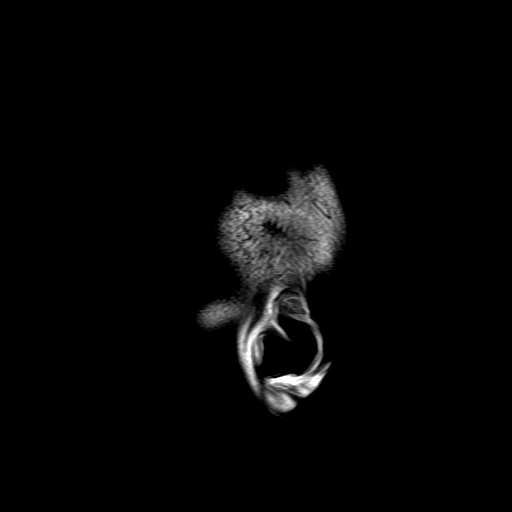

[Series 4: DWI · axial · 3.0mm · 1.09mm/px · z∈[-116,+16]mm · 11 of 94 slices shown (1 of 4)]
[im 1/94]
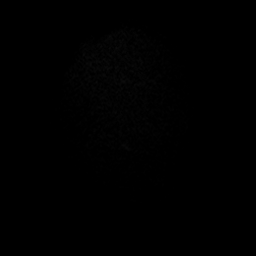
[im 10/94]
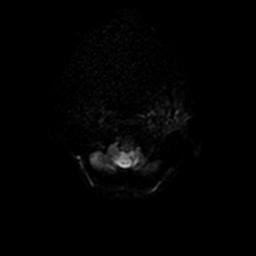
[im 19/94]
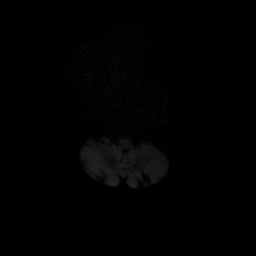
[im 28/94]
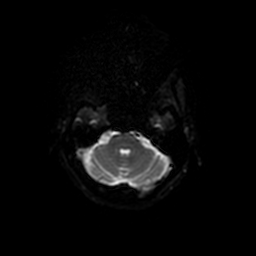
[im 38/94]
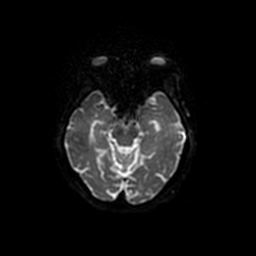
[im 47/94]
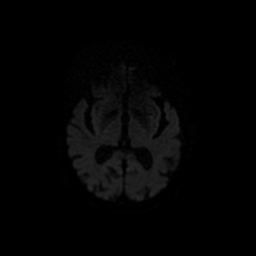
[im 56/94]
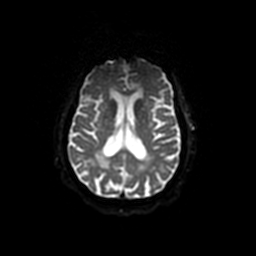
[im 66/94]
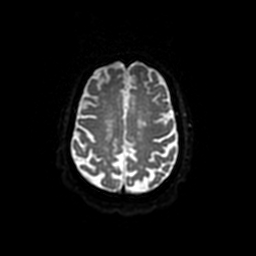
[im 75/94]
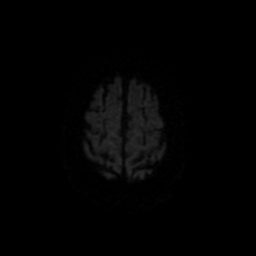
[im 84/94]
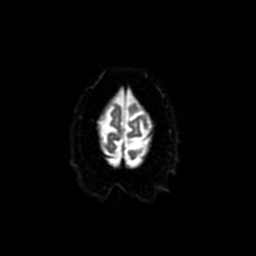
[im 94/94]
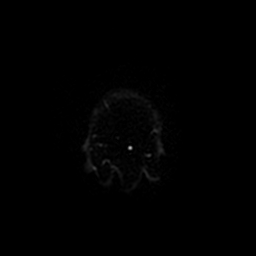

[Series 5: DWI · coronal · 5.0mm · 1.09mm/px · 8 of 72 slices shown (2 of 4)]
[im 1/72]
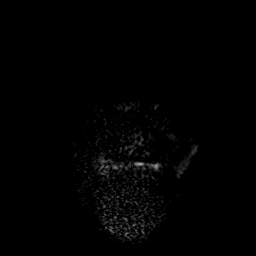
[im 11/72]
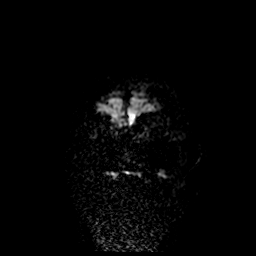
[im 21/72]
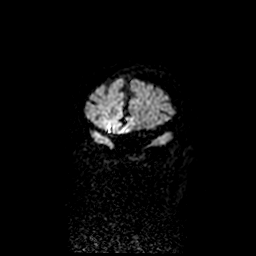
[im 31/72]
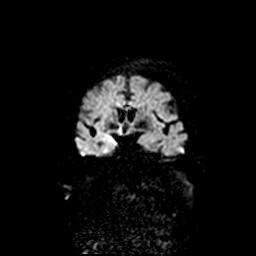
[im 41/72]
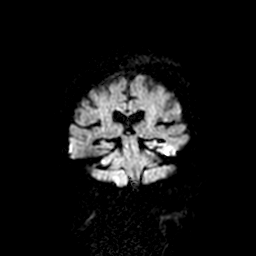
[im 51/72]
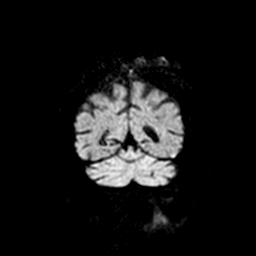
[im 61/72]
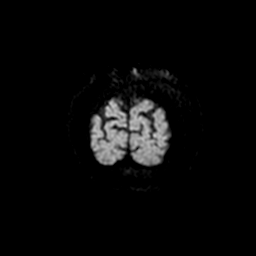
[im 72/72]
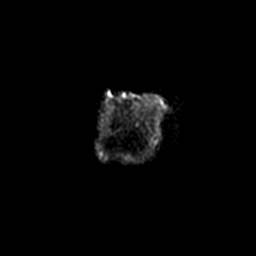

[Series 6: T2 · axial · 5.0mm · 0.43mm/px · z∈[-109,+26]mm · 3 of 23 slices shown (1 of 2)]
[im 1/23]
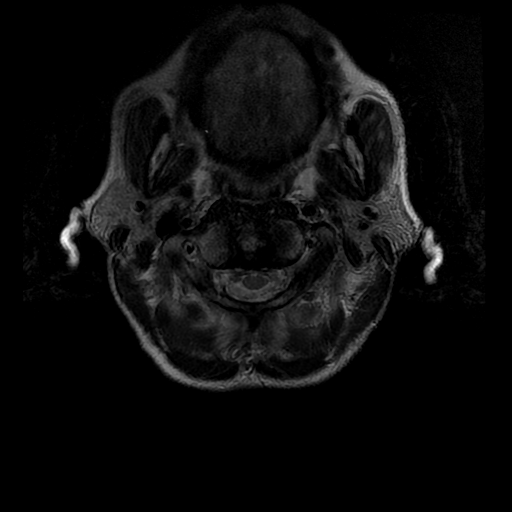
[im 12/23]
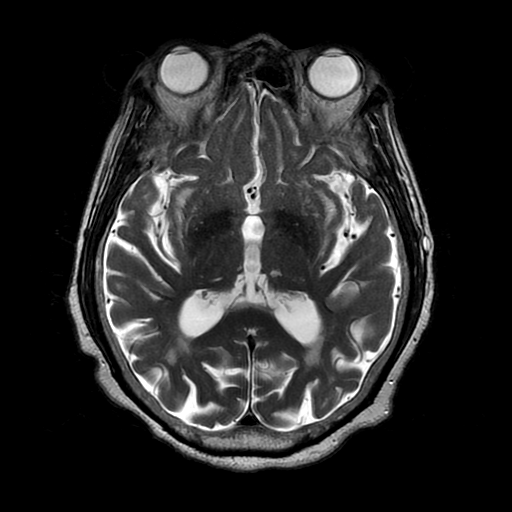
[im 23/23]
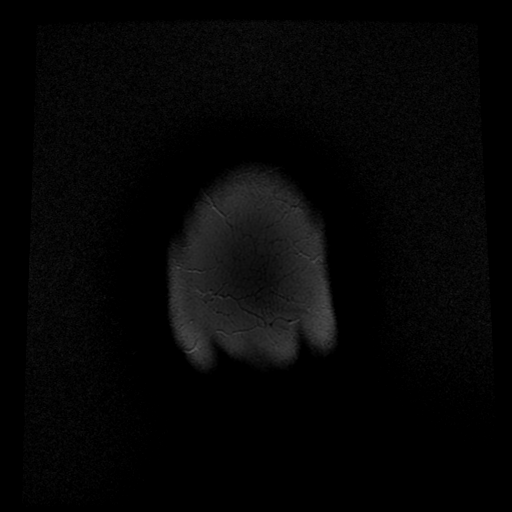

[Series 7: FLAIR · axial · 5.0mm · 0.43mm/px · z∈[-115,+31]mm · 3 of 23 slices shown]
[im 1/23]
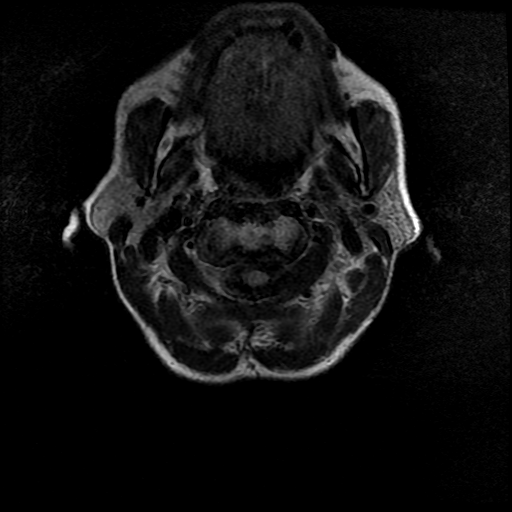
[im 12/23]
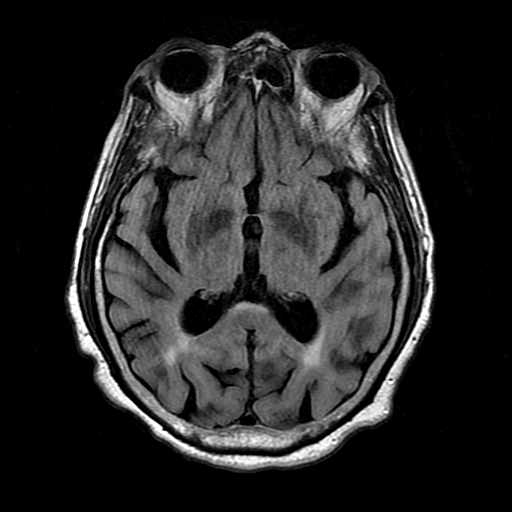
[im 23/23]
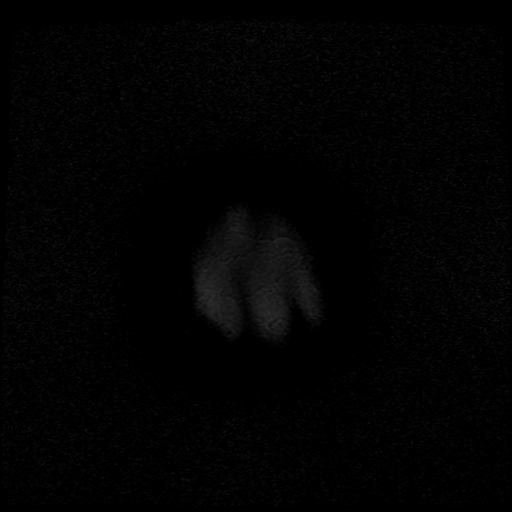

[Series 10: T2 · coronal · 5.0mm · 0.45mm/px · 3 of 27 slices shown (2 of 2)]
[im 1/27]
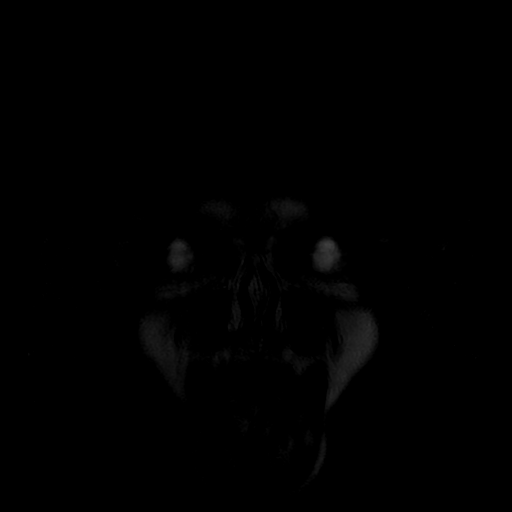
[im 14/27]
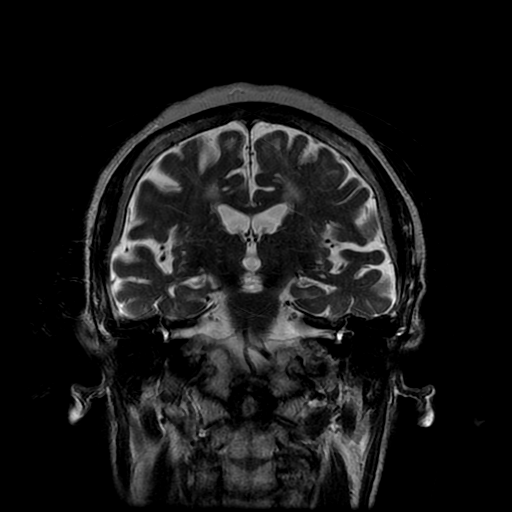
[im 27/27]
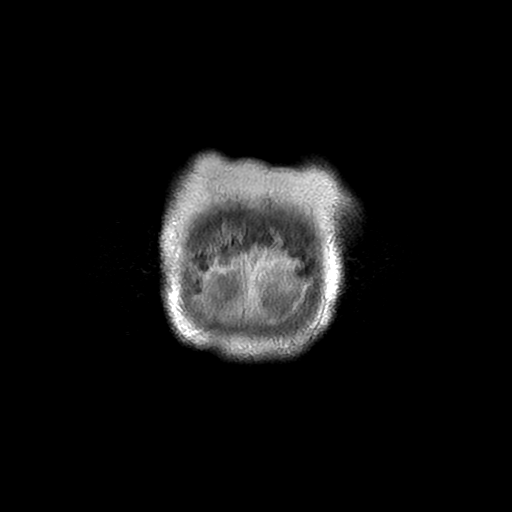

[Series 400: DWI · axial · 3.0mm · 1.09mm/px · z∈[-116,+16]mm · 5 of 47 slices shown (3 of 4)]
[im 1/47]
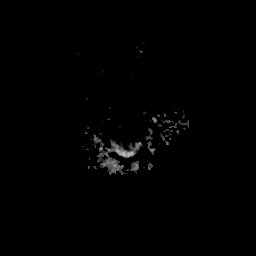
[im 12/47]
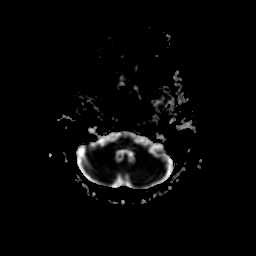
[im 24/47]
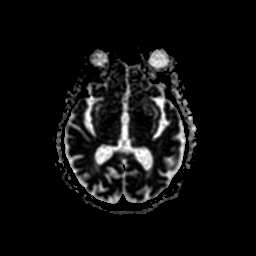
[im 35/47]
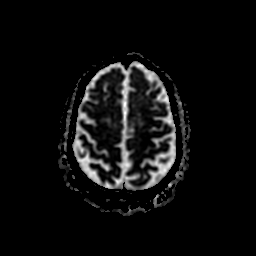
[im 47/47]
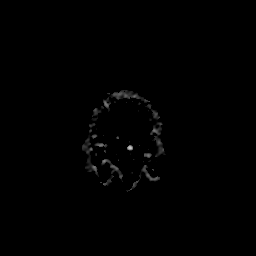

[Series 500: DWI · coronal · 5.0mm · 1.09mm/px · 4 of 36 slices shown (4 of 4)]
[im 1/36]
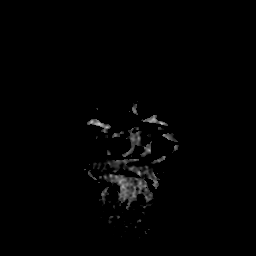
[im 12/36]
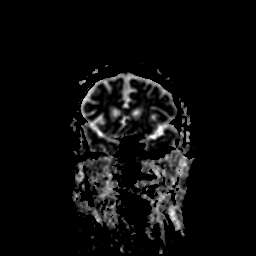
[im 24/36]
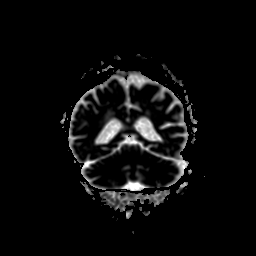
[im 36/36]
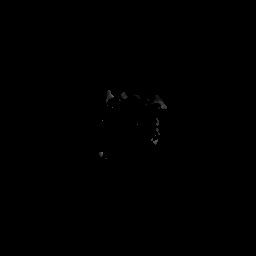

[38 of 48 positions shown; findings below may reference images not displayed]

FINDINGS: No evidence for acute infarction, hemorrhage, mass lesion,
hydrocephalus, or extra-axial fluid. Generalized cerebral and
cerebellar atrophy. Extensive T2 and FLAIR hyperintensities
throughout the periventricular and subcortical white matter,
consistent with small vessel disease. Flow voids are maintained
throughout the carotid, basilar, and vertebral arteries. There are
no areas of chronic hemorrhage. Pituitary, pineal, and cerebellar
tonsils unremarkable. No upper cervical lesions. Mild pannus, with
mild cervical spondylosis. Chronic LEFT cerebellar infarct. Chronic
BILATERAL thalamic infarcts. Tiny chronic microhemorrhages in the
brain, better appreciated on the previous 3T scan at[HOSPITAL].
No acute sinus or mastoid disease. Negative orbits. Scalp soft
tissues unremarkable.
IMPRESSION: Chronic changes as described. No acute intracranial abnormality.
Similar appearance to prior MR.

## 2016-08-23 NOTE — Progress Notes (Signed)
the 3rd toe, base of the second toe and the webspace of first and second toes. Dry ischemic area on th lateral left first met head. There is no evidence of infection spreading up the foot. Electronic Signature(s) Signed: 08/22/2016 3:44:58 PM By: Linton Ham MD Entered By: Linton Ham on 08/22/2016 12:22:44 Shannon [name] ([id number]) -------------------------------------------------------------------------------- Physician Orders Details Patient Name: Shannon [name] Date of Service: 08/22/2016 10:15 AM Medical Record Patient Account Number: [id number] Number: Treating RN: Cornell Barman [birth year] (83 y.o. Other Clinician: Date of Birth/Sex: Female) Treating ROBSON, MICHAEL Primary Care Provider: Eulas Post, MONICA Provider/Extender: G Referring Provider: Gildardo Cranker Weeks in Treatment: 2 Verbal / Phone Orders: No Diagnosis Coding Wound Cleansing Wound #1 Right,Medial Toe Great o Clean wound with Normal Saline. Wound #2 Left,Circumferential Toe Second o Clean wound with Normal Saline. Wound #3 Left,Medial Toe Great o Clean wound with Normal Saline. Anesthetic Wound #1 Right,Medial Toe Great o Topical Lidocaine 4% cream applied to wound bed prior to debridement Wound #2 Left,Circumferential Toe Second o Topical Lidocaine 4% cream applied to wound bed prior to debridement Wound #3 Left,Medial Toe Great o Topical Lidocaine 4% cream applied to wound bed prior to debridement Primary Wound Dressing Wound #1 Right,Medial Toe Great o Aquacel Ag Wound #2 Left,Circumferential Toe Second o Aquacel  Ag Wound #3 Left,Medial Toe Great o Aquacel Ag Secondary Dressing Wound #1 Right,Medial Toe Great o Gauze and Kerlix/Conform Wound #2 Left,Circumferential Toe Second [name], Shannon ([id number]) o Gauze and Kerlix/Conform Wound #3 Left,Medial Toe Great o Gauze and Kerlix/Conform Dressing Change Frequency Wound #1 Right,Medial Toe Great o Change dressing every other day. Wound #2 Left,Circumferential Toe Second o Change dressing every other day. Wound #3 Left,Medial Toe Great o Change dressing every other day. Follow-up Appointments Wound #1 Right,Medial Toe Great o Return Appointment in 2 weeks. Wound #2 Left,Circumferential Toe Second o Return Appointment in 2 weeks. Wound #3 Left,Medial Toe Great o Return Appointment in 2 weeks. Additional Orders / Instructions Wound #1 Right,Medial Toe Great o Increase protein intake. o Activity as tolerated Wound #2 Left,Circumferential Toe Second o Increase protein intake. o Activity as tolerated Wound #3 Left,Medial Toe Great o Increase protein intake. o Activity as tolerated Home Health Wound #1 [place] [facility] [place] [facility] o Home Health Nurse may visit PRN to address patientos wound care needs. o FACE TO FACE ENCOUNTER: MEDICARE and MEDICAID PATIENTS: I certify that this patient is under my care and that I had a face-to-face encounter that meets the physician face-to-face encounter requirements with this patient on this date. The encounter with the patient was in whole or in part for the following MEDICAL CONDITION: (primary reason for [place]) MEDICAL NECESSITY: I certify, that based on my findings, NURSING services are a medically RHETTA, CLEEK ([id number]) necessary home health service. HOME BOUND STATUS: I certify that my clinical findings support that this patient is homebound (i.e., Due to  illness or injury, pt requires aid of supportive devices such as crutches, cane, wheelchairs, walkers, the use of special transportation or the assistance of another person to leave their place of residence. There is a normal inability to leave the home and doing so requires considerable and taxing effort. Other absences are for medical reasons / religious services and are infrequent or of short duration when for other reasons). o If current dressing causes regression in wound condition, may D/C ordered dressing product/s and apply Normal Saline Moist  Number: [id number] Number: Treating RN: Montey Hora 1923/10/05 (83 y.o. 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 Other Clinician: Date of Birth/Sex: Female) Treating ROBSON, MICHAEL Primary Care Provider: Eulas Post, MONICA Provider/Extender:  G Referring Provider: Gildardo Cranker Weeks in Treatment: 2 Subjective Chief Complaint Information obtained from Patient 08/08/16; patient is here for review of wound of the left 2nd toe, left first toe and a small area of the right first toe associated with severe PAD History of Present Illness (HPI) 08/08/16; this is a 83 year old women who arrives in our clinic referred from her podiatrist Dr. Amalia Hailey. She is a type 2 diabetic with PAD who recently came to the attention of vein and vascular in [place]. She presented with a black toe on the left foot. She was admitted to hospital with this from 07/28/16-08/01/16. MRI of the foot suggested a tiny subcutaneous abscess at the base of the second proximal phalynx suggested osteomyelitis. She did not wish to consider amputation or angiography. She received vanc and cephapime in the hospital discharged on clindamycin for a month. She is on Coumadin. Stage 3 crf, Previous non invasive arterial studies showed diffuse plaque with circumferential calcified vessels. monophasic waveforms in the superficial femoral artery and below. Patient is in moderate pain. They are only using dial soaks. and gauze. 08/22/16 Patient returns to clinic with ischemic wounds on her left 2nd,3rd, and first toes. She denies severe pain,fever or other systemic symptoms. Tells me she was in hospital for pneumonia. I will check these records. She states she received different antibiotics from the clindamycin she was on. she has been using silver alginate. [facility]. She again tells me she does not wish to have attempts at revascularization or amputation. She does not feel she wound survive the surgery. Objective Constitutional Patient is hypertensive.. Pulse regular and within target range for patient.[name] Respirations regular, non-labored and within target range.. Temperature is normal and within the target range for the patient.. Patient's appearance is neat and  clean. Appears in no acute distress. Well nourished and well developed.. Vitals Time Taken: 10:50 AM, Height: 66 in, Weight: 147 lbs, BMI: 23.7, Temperature: 98.3 F, Pulse: 70 Battey, Kailiana ([id number]) bpm, Respiratory Rate: 20 breaths/min, Blood Pressure: 150/68 mmHg. Eyes Conjunctivae clear. No discharge.[name] Respiratory Respiratory effort is easy and symmetric bilaterally. Rate is normal at rest and on room air.. Cardiovascular absent popliteal pulse on the left. Pedal pulses absent bilaterally.. Lymphatic none palpable in the popliteal and inguinal area. Psychiatric No evidence of depression, anxiety, or agitation. Calm, cooperative, and communicative. Appropriate interactions and affect.. General Notes: Wound exam; This is mildly worse than last time, Mummified left second toe. necrosis on the medial aspect of the 3rd toe, base of the second toe and the  Number: [id number] Number: Treating RN: Montey Hora 1923/10/05 (83 y.o. 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 Other Clinician: Date of Birth/Sex: Female) Treating ROBSON, MICHAEL Primary Care Provider: Eulas Post, MONICA Provider/Extender:  G Referring Provider: Gildardo Cranker Weeks in Treatment: 2 Subjective Chief Complaint Information obtained from Patient 08/08/16; patient is here for review of wound of the left 2nd toe, left first toe and a small area of the right first toe associated with severe PAD History of Present Illness (HPI) 08/08/16; this is a 83 year old women who arrives in our clinic referred from her podiatrist Dr. Amalia Hailey. She is a type 2 diabetic with PAD who recently came to the attention of vein and vascular in [place]. She presented with a black toe on the left foot. She was admitted to hospital with this from 07/28/16-08/01/16. MRI of the foot suggested a tiny subcutaneous abscess at the base of the second proximal phalynx suggested osteomyelitis. She did not wish to consider amputation or angiography. She received vanc and cephapime in the hospital discharged on clindamycin for a month. She is on Coumadin. Stage 3 crf, Previous non invasive arterial studies showed diffuse plaque with circumferential calcified vessels. monophasic waveforms in the superficial femoral artery and below. Patient is in moderate pain. They are only using dial soaks. and gauze. 08/22/16 Patient returns to clinic with ischemic wounds on her left 2nd,3rd, and first toes. She denies severe pain,fever or other systemic symptoms. Tells me she was in hospital for pneumonia. I will check these records. She states she received different antibiotics from the clindamycin she was on. she has been using silver alginate. [facility]. She again tells me she does not wish to have attempts at revascularization or amputation. She does not feel she wound survive the surgery. Objective Constitutional Patient is hypertensive.. Pulse regular and within target range for patient.[name] Respirations regular, non-labored and within target range.. Temperature is normal and within the target range for the patient.. Patient's appearance is neat and  clean. Appears in no acute distress. Well nourished and well developed.. Vitals Time Taken: 10:50 AM, Height: 66 in, Weight: 147 lbs, BMI: 23.7, Temperature: 98.3 F, Pulse: 70 Battey, Kailiana ([id number]) bpm, Respiratory Rate: 20 breaths/min, Blood Pressure: 150/68 mmHg. Eyes Conjunctivae clear. No discharge.[name] Respiratory Respiratory effort is easy and symmetric bilaterally. Rate is normal at rest and on room air.. Cardiovascular absent popliteal pulse on the left. Pedal pulses absent bilaterally.. Lymphatic none palpable in the popliteal and inguinal area. Psychiatric No evidence of depression, anxiety, or agitation. Calm, cooperative, and communicative. Appropriate interactions and affect.. General Notes: Wound exam; This is mildly worse than last time, Mummified left second toe. necrosis on the medial aspect of the 3rd toe, base of the second toe and the  the 3rd toe, base of the second toe and the webspace of first and second toes. Dry ischemic area on th lateral left first met head. There is no evidence of infection spreading up the foot. Electronic Signature(s) Signed: 08/22/2016 3:44:58 PM By: Linton Ham MD Entered By: Linton Ham on 08/22/2016 12:22:44 Shannon [name] ([id number]) -------------------------------------------------------------------------------- Physician Orders Details Patient Name: Shannon [name] Date of Service: 08/22/2016 10:15 AM Medical Record Patient Account Number: [id number] Number: Treating RN: Cornell Barman [birth year] (83 y.o. Other Clinician: Date of Birth/Sex: Female) Treating ROBSON, MICHAEL Primary Care Provider: Eulas Post, MONICA Provider/Extender: G Referring Provider: Gildardo Cranker Weeks in Treatment: 2 Verbal / Phone Orders: No Diagnosis Coding Wound Cleansing Wound #1 Right,Medial Toe Great o Clean wound with Normal Saline. Wound #2 Left,Circumferential Toe Second o Clean wound with Normal Saline. Wound #3 Left,Medial Toe Great o Clean wound with Normal Saline. Anesthetic Wound #1 Right,Medial Toe Great o Topical Lidocaine 4% cream applied to wound bed prior to debridement Wound #2 Left,Circumferential Toe Second o Topical Lidocaine 4% cream applied to wound bed prior to debridement Wound #3 Left,Medial Toe Great o Topical Lidocaine 4% cream applied to wound bed prior to debridement Primary Wound Dressing Wound #1 Right,Medial Toe Great o Aquacel Ag Wound #2 Left,Circumferential Toe Second o Aquacel  Ag Wound #3 Left,Medial Toe Great o Aquacel Ag Secondary Dressing Wound #1 Right,Medial Toe Great o Gauze and Kerlix/Conform Wound #2 Left,Circumferential Toe Second [name], Shannon ([id number]) o Gauze and Kerlix/Conform Wound #3 Left,Medial Toe Great o Gauze and Kerlix/Conform Dressing Change Frequency Wound #1 Right,Medial Toe Great o Change dressing every other day. Wound #2 Left,Circumferential Toe Second o Change dressing every other day. Wound #3 Left,Medial Toe Great o Change dressing every other day. Follow-up Appointments Wound #1 Right,Medial Toe Great o Return Appointment in 2 weeks. Wound #2 Left,Circumferential Toe Second o Return Appointment in 2 weeks. Wound #3 Left,Medial Toe Great o Return Appointment in 2 weeks. Additional Orders / Instructions Wound #1 Right,Medial Toe Great o Increase protein intake. o Activity as tolerated Wound #2 Left,Circumferential Toe Second o Increase protein intake. o Activity as tolerated Wound #3 Left,Medial Toe Great o Increase protein intake. o Activity as tolerated Home Health Wound #1 [place] [facility] [place] [facility] o Home Health Nurse may visit PRN to address patientos wound care needs. o FACE TO FACE ENCOUNTER: MEDICARE and MEDICAID PATIENTS: I certify that this patient is under my care and that I had a face-to-face encounter that meets the physician face-to-face encounter requirements with this patient on this date. The encounter with the patient was in whole or in part for the following MEDICAL CONDITION: (primary reason for [place]) MEDICAL NECESSITY: I certify, that based on my findings, NURSING services are a medically RHETTA, CLEEK ([id number]) necessary home health service. HOME BOUND STATUS: I certify that my clinical findings support that this patient is homebound (i.e., Due to  illness or injury, pt requires aid of supportive devices such as crutches, cane, wheelchairs, walkers, the use of special transportation or the assistance of another person to leave their place of residence. There is a normal inability to leave the home and doing so requires considerable and taxing effort. Other absences are for medical reasons / religious services and are infrequent or of short duration when for other reasons). o If current dressing causes regression in wound condition, may D/C ordered dressing product/s and apply Normal Saline Moist  Number: [id number] Number: Treating RN: Montey Hora 1923/10/05 (83 y.o. 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 Other Clinician: Date of Birth/Sex: Female) Treating ROBSON, MICHAEL Primary Care Provider: Eulas Post, MONICA Provider/Extender:  G Referring Provider: Gildardo Cranker Weeks in Treatment: 2 Subjective Chief Complaint Information obtained from Patient 08/08/16; patient is here for review of wound of the left 2nd toe, left first toe and a small area of the right first toe associated with severe PAD History of Present Illness (HPI) 08/08/16; this is a 83 year old women who arrives in our clinic referred from her podiatrist Dr. Amalia Hailey. She is a type 2 diabetic with PAD who recently came to the attention of vein and vascular in [place]. She presented with a black toe on the left foot. She was admitted to hospital with this from 07/28/16-08/01/16. MRI of the foot suggested a tiny subcutaneous abscess at the base of the second proximal phalynx suggested osteomyelitis. She did not wish to consider amputation or angiography. She received vanc and cephapime in the hospital discharged on clindamycin for a month. She is on Coumadin. Stage 3 crf, Previous non invasive arterial studies showed diffuse plaque with circumferential calcified vessels. monophasic waveforms in the superficial femoral artery and below. Patient is in moderate pain. They are only using dial soaks. and gauze. 08/22/16 Patient returns to clinic with ischemic wounds on her left 2nd,3rd, and first toes. She denies severe pain,fever or other systemic symptoms. Tells me she was in hospital for pneumonia. I will check these records. She states she received different antibiotics from the clindamycin she was on. she has been using silver alginate. [facility]. She again tells me she does not wish to have attempts at revascularization or amputation. She does not feel she wound survive the surgery. Objective Constitutional Patient is hypertensive.. Pulse regular and within target range for patient.[name] Respirations regular, non-labored and within target range.. Temperature is normal and within the target range for the patient.. Patient's appearance is neat and  clean. Appears in no acute distress. Well nourished and well developed.. Vitals Time Taken: 10:50 AM, Height: 66 in, Weight: 147 lbs, BMI: 23.7, Temperature: 98.3 F, Pulse: 70 Battey, Kailiana ([id number]) bpm, Respiratory Rate: 20 breaths/min, Blood Pressure: 150/68 mmHg. Eyes Conjunctivae clear. No discharge.[name] Respiratory Respiratory effort is easy and symmetric bilaterally. Rate is normal at rest and on room air.. Cardiovascular absent popliteal pulse on the left. Pedal pulses absent bilaterally.. Lymphatic none palpable in the popliteal and inguinal area. Psychiatric No evidence of depression, anxiety, or agitation. Calm, cooperative, and communicative. Appropriate interactions and affect.. General Notes: Wound exam; This is mildly worse than last time, Mummified left second toe. necrosis on the medial aspect of the 3rd toe, base of the second toe and the  the 3rd toe, base of the second toe and the webspace of first and second toes. Dry ischemic area on th lateral left first met head. There is no evidence of infection spreading up the foot. Electronic Signature(s) Signed: 08/22/2016 3:44:58 PM By: Linton Ham MD Entered By: Linton Ham on 08/22/2016 12:22:44 Shannon [name] ([id number]) -------------------------------------------------------------------------------- Physician Orders Details Patient Name: Shannon [name] Date of Service: 08/22/2016 10:15 AM Medical Record Patient Account Number: [id number] Number: Treating RN: Cornell Barman [birth year] (83 y.o. Other Clinician: Date of Birth/Sex: Female) Treating ROBSON, MICHAEL Primary Care Provider: Eulas Post, MONICA Provider/Extender: G Referring Provider: Gildardo Cranker Weeks in Treatment: 2 Verbal / Phone Orders: No Diagnosis Coding Wound Cleansing Wound #1 Right,Medial Toe Great o Clean wound with Normal Saline. Wound #2 Left,Circumferential Toe Second o Clean wound with Normal Saline. Wound #3 Left,Medial Toe Great o Clean wound with Normal Saline. Anesthetic Wound #1 Right,Medial Toe Great o Topical Lidocaine 4% cream applied to wound bed prior to debridement Wound #2 Left,Circumferential Toe Second o Topical Lidocaine 4% cream applied to wound bed prior to debridement Wound #3 Left,Medial Toe Great o Topical Lidocaine 4% cream applied to wound bed prior to debridement Primary Wound Dressing Wound #1 Right,Medial Toe Great o Aquacel Ag Wound #2 Left,Circumferential Toe Second o Aquacel  Ag Wound #3 Left,Medial Toe Great o Aquacel Ag Secondary Dressing Wound #1 Right,Medial Toe Great o Gauze and Kerlix/Conform Wound #2 Left,Circumferential Toe Second [name], Shannon ([id number]) o Gauze and Kerlix/Conform Wound #3 Left,Medial Toe Great o Gauze and Kerlix/Conform Dressing Change Frequency Wound #1 Right,Medial Toe Great o Change dressing every other day. Wound #2 Left,Circumferential Toe Second o Change dressing every other day. Wound #3 Left,Medial Toe Great o Change dressing every other day. Follow-up Appointments Wound #1 Right,Medial Toe Great o Return Appointment in 2 weeks. Wound #2 Left,Circumferential Toe Second o Return Appointment in 2 weeks. Wound #3 Left,Medial Toe Great o Return Appointment in 2 weeks. Additional Orders / Instructions Wound #1 Right,Medial Toe Great o Increase protein intake. o Activity as tolerated Wound #2 Left,Circumferential Toe Second o Increase protein intake. o Activity as tolerated Wound #3 Left,Medial Toe Great o Increase protein intake. o Activity as tolerated Home Health Wound #1 [place] [facility] [place] [facility] o Home Health Nurse may visit PRN to address patientos wound care needs. o FACE TO FACE ENCOUNTER: MEDICARE and MEDICAID PATIENTS: I certify that this patient is under my care and that I had a face-to-face encounter that meets the physician face-to-face encounter requirements with this patient on this date. The encounter with the patient was in whole or in part for the following MEDICAL CONDITION: (primary reason for [place]) MEDICAL NECESSITY: I certify, that based on my findings, NURSING services are a medically RHETTA, CLEEK ([id number]) necessary home health service. HOME BOUND STATUS: I certify that my clinical findings support that this patient is homebound (i.e., Due to  illness or injury, pt requires aid of supportive devices such as crutches, cane, wheelchairs, walkers, the use of special transportation or the assistance of another person to leave their place of residence. There is a normal inability to leave the home and doing so requires considerable and taxing effort. Other absences are for medical reasons / religious services and are infrequent or of short duration when for other reasons). o If current dressing causes regression in wound condition, may D/C ordered dressing product/s and apply Normal Saline Moist  SHAREECE, BULTMAN ([id number]) Visit Report for 08/22/2016 Chief Complaint Document Details Patient Name: BRIASIA, FLINDERS Date of Service: 08/22/2016 10:15 AM Medical Record Patient Account Number: [id number] Number: Treating RN: Montey Hora 1923/08/07 (83 y.o. Other Clinician: Date of Birth/Sex: Female) Treating ROBSON, MICHAEL Primary Care Provider: Eulas Post, MONICA Provider/Extender: G Referring Provider: Gildardo Cranker Weeks in Treatment: 2 Information Obtained from: Patient Chief Complaint 08/08/16; patient is here for review of wound of the left 2nd toe, left first toe and a small area of the right first toe associated with severe PAD Electronic Signature(s) Signed: 08/22/2016 3:44:58 PM By: Linton Ham MD Entered By: Linton Ham on 08/22/2016 12:04:53 Shannon [name] ([id number]) -------------------------------------------------------------------------------- HPI Details Patient Name: Shannon [name] Date of Service: 08/22/2016 10:15 AM Medical Record Patient Account Number: [id number] Number: Treating RN: Montey Hora 1923-07-17 (83 y.o. Other Clinician: Date of Birth/Sex: Female) Treating ROBSON, MICHAEL Primary Care Provider: Eulas Post, MONICA Provider/Extender: G Referring Provider: Gildardo Cranker Weeks in Treatment: 2 History of Present Illness HPI Description: 08/08/16; this is a 83 year old women who arrives in our clinic referred from her podiatrist Dr. Amalia Hailey. She is a type 2 diabetic with PAD who recently came to the attention of vein and vascular in [place]. She presented with a black toe on the left foot. She was admitted to hospital with this from 07/28/16-08/01/16. MRI of the foot suggested a tiny subcutaneous abscess at the base of the second proximal phalynx suggested osteomyelitis. She did not wish to consider amputation or angiography. She received vanc and cephapime in the hospital discharged on clindamycin for a month. She is on Coumadin. Stage  3 crf, Previous non invasive arterial studies showed diffuse plaque with circumferential calcified vessels. monophasic waveforms in the superficial femoral artery and below. Patient is in moderate pain. They are only using dial soaks. and gauze. 08/22/16 Patient returns to clinic with ischemic wounds on her left 2nd,3rd, and first toes. She denies severe pain,fever or other systemic symptoms. Tells me she was in hospital for pneumonia. I will check these records. She states she received different antibiotics from the clindamycin she was on. she has been using silver alginate. [facility] has [facility]. She again tells me she does not wish to have attempts at revascularization or amputation. She does not feel she wound survive the surgery. Electronic Signature(s) Signed: 08/22/2016 3:44:58 PM By: Linton Ham MD Entered By: Linton Ham on 08/22/2016 12:10:50 Shannon [name] ([id number]) -------------------------------------------------------------------------------- Physical Exam Details Patient Name: Shannon [name] Date of Service: 08/22/2016 10:15 AM Medical Record Patient Account Number: [id number] Number: Treating RN: Montey Hora 11/25/1923 (83 y.o. Other Clinician: Date of Birth/Sex: Female) Treating ROBSON, MICHAEL Primary Care Provider: Eulas Post, MONICA Provider/Extender: G Referring Provider: Gildardo Cranker Weeks in Treatment: 2 Constitutional Patient is hypertensive.. Pulse regular and within target range for patient.[name] Respirations regular, non-labored and within target range.. Temperature is normal and within the target range for the patient.. Patient's appearance is neat and clean. Appears in no acute distress. Well nourished and well developed.. Eyes Conjunctivae clear. No discharge.[name] Respiratory Respiratory effort is easy and symmetric bilaterally. Rate is normal at rest and on room air.. Cardiovascular absent popliteal pulse on the left. Pedal pulses  absent bilaterally.. Lymphatic none palpable in the popliteal and inguinal area. Integumentary (Hair, Skin) . Psychiatric No evidence of depression, anxiety, or agitation. Calm, cooperative, and communicative. Appropriate interactions and affect.. Notes Wound exam; This is mildly worse than last time, Mummified left second toe. necrosis on the medial aspect of

## 2016-08-23 NOTE — Telephone Encounter (Signed)
New orders faxed and received from [facility].  Dr Meda Coffee signed accordingly, and this will be faxed back to [facility], as requested.

## 2016-08-23 NOTE — Progress Notes (Signed)
of the Lower Extremity the Lower Extremity the Lower Extremity Comorbid History: Cataracts, Anemia, Cataracts, Anemia, Cataracts, Anemia, Asthma, Chronic Asthma, Chronic Asthma, Chronic Obstructive Pulmonary Obstructive Pulmonary Obstructive Pulmonary Disease (COPD), Disease (COPD), Disease (COPD), Arrhythmia, Congestive Arrhythmia, Congestive Arrhythmia, Congestive Heart Failure, Coronary Heart Failure, Coronary Heart Failure, Coronary Artery Disease, Deep Vein Artery Disease, Deep Vein Artery Disease, Deep Vein Thrombosis, Thrombosis, Thrombosis, Hypertension, Type II Hypertension, Type II Hypertension, Type II Shannon [name], Shannon [name] ([id number]) Diabetes, Osteoarthritis, Diabetes, Osteoarthritis, Diabetes, Osteoarthritis, Osteomyelitis, Neuropathy Osteomyelitis, Neuropathy Osteomyelitis, Neuropathy Date Acquired: 07/18/2016 07/11/2016 07/11/2016 Weeks of Treatment: '2 2 2 ' Wound Status: Open Open Open Pending Amputation on No Yes No Presentation: Measurements L x W x D 0.4x0.4x0.1 9x2.5x0.1 1x1.3x0.1 (cm) Area (cm) : 0.126 17.671 1.021 Volume (cm) : 0.013 1.767 0.102 % Reduction in Area: 46.60% 0.00% 13.30% % Reduction in Volume: 45.80% 0.00% 13.60% Classification: Grade 1 Unable to visualize wound Unable to visualize wound bed bed Exudate  Amount: Medium Medium None Present Exudate Type: Serosanguineous Serosanguineous N/A Exudate Color: red, brown red, brown N/A Wound Margin: Distinct, outline attached Indistinct, nonvisible Distinct, outline attached Granulation Amount: None Present (0%) None Present (0%) None Present (0%) Necrotic Amount: Large (67-100%) Large (67-100%) Large (67-100%) Necrotic Tissue: Eschar Eschar, Adherent Slough Eschar Exposed Structures: Fascia: No Fascia: No Fascia: No Fat Layer (Subcutaneous Fat Layer (Subcutaneous Fat Layer (Subcutaneous Tissue) Exposed: No Tissue) Exposed: No Tissue) Exposed: No Tendon: No Tendon: No Tendon: No Muscle: No Muscle: No Muscle: No Joint: No Joint: No Joint: No Bone: No Bone: No Bone: No Limited to Skin Limited to Skin Limited to Skin Breakdown Breakdown Breakdown Epithelialization: None None None Periwound Skin Texture: Callus: Yes Excoriation: No Excoriation: No Excoriation: No Induration: No Induration: No Induration: No Callus: No Callus: No Crepitus: No Crepitus: No Crepitus: No Rash: No Rash: No Rash: No Scarring: No Scarring: No Scarring: No Periwound Skin Dry/Scaly: Yes Maceration: No Dry/Scaly: Yes Moisture: Maceration: No Dry/Scaly: No Maceration: No Periwound Skin Color: Hemosiderin Staining: Yes Atrophie Blanche: No Atrophie Blanche: No Atrophie Blanche: No Cyanosis: No Cyanosis: No Cyanosis: No Ecchymosis: No Ecchymosis: No Ecchymosis: No Erythema: No Erythema: No Erythema: No Hemosiderin Staining: No Hemosiderin Staining: No Mottled: No Mottled: No Mottled: No Pallor: No Pallor: No Pallor: No Rubor: No Rubor: No Rubor: No Temperature: No Abnormality No Abnormality No Abnormality Yes Yes No Shannon [name], Shannon [name] ([id number]) Tenderness on Palpation: Wound Preparation: Ulcer Cleansing: Ulcer Cleansing: Ulcer Cleansing: Rinsed/Irrigated with Rinsed/Irrigated with Rinsed/Irrigated with Saline Saline  Saline Topical Anesthetic Topical Anesthetic Topical Anesthetic Applied: Other: lidocaine Applied: Other: lidocaine Applied: Other: lidocaine 4% 4% 2% Treatment Notes Wound #1 (Right, Medial Toe Great) 1. Cleansed with: Clean wound with Normal Saline 4. Dressing Applied: Aquacel Ag 5. Secondary Dressing Applied Gauze and Kerlix/Conform 7. Secured with Tape Wound #2 (Left, Circumferential Toe Second) 1. Cleansed with: Clean wound with Normal Saline 4. Dressing Applied: Aquacel Ag 5. Secondary Dressing Applied Gauze and Kerlix/Conform 7. Secured with Tape Wound #3 (Left, Medial Toe Great) 1. Cleansed with: Clean wound with Normal Saline 4. Dressing Applied: Aquacel Ag 5. Secondary Dressing Applied Gauze and Kerlix/Conform 7. Secured with Recruitment consultant) Signed: 08/22/2016 3:44:58 PM By: Linton Ham MD Entered By: Linton Ham on 08/22/2016 12:04:46 Shannon [name] ([id number]) Shannon [name], Shannon [name] ([id number]) -------------------------------------------------------------------------------- Multi-Disciplinary Care Plan Details Patient Name: Shannon [name] Date of Service: 08/22/2016 10:15 AM Medical Record Patient Account Number: [id number] Number: Treating RN: Cornell Barman 05/20/1923 (83 y.o. Other Clinician: Date of Birth/Sex: Female) Treating ROBSON, MICHAEL  5 X - Wound Tracing (instead of photographs) 1 5 '[]'  - Simple Wound Measurement - one wound 0 X - Complex Wound Measurement - multiple wounds 3 5 INTERVENTIONS - Wound Dressings '[]'  - Small Wound Dressing one or multiple wounds 0 X - Medium Wound Dressing one or multiple wounds 2 15 '[]'  - Large Wound Dressing one or multiple wounds 0 '[]'  - Application of Medications - topical 0 '[]'  - Application of Medications - injection 0 Shannon [name], Shannon [name] ([id number]) INTERVENTIONS - Miscellaneous '[]'  - External ear exam 0 '[]'  - Specimen Collection (cultures, biopsies, blood, body fluids, etc.) 0 '[]'  - Specimen(s) / Culture(s) sent or taken to Lab for analysis 0 '[]'  - Patient Transfer (multiple staff / Harrel Lemon Lift / Similar devices) 0 '[]'  - Simple Staple / Suture removal (25 or less) 0 '[]'  - Complex Staple / Suture removal (26 or more) 0 '[]'  - Hypo / Hyperglycemic Management (close monitor of Blood Glucose) 0 '[]'  - Ankle / Brachial Index (ABI) - do not check if billed separately 0 X - Vital Signs 1 5 Has the patient been seen at the hospital within the last three years: Yes Total Score: 130 Level Of Care: New/Established - Level 4 Electronic Signature(s) Signed: 08/22/2016 3:34:22 PM By: Gretta Cool, BSN, RN, CWS, Kim RN, BSN Entered By: Gretta Cool, BSN, RN, CWS, Kim on 08/22/2016 11:37:31 Shannon [name] ([id number]) -------------------------------------------------------------------------------- Encounter Discharge Information Details Patient Name: Shannon [name] Date of Service: 08/22/2016 10:15 AM Medical Record Patient Account Number: [id number] Number: Treating RN: Montey Hora Oct 05, 1923 (83 y.o. Other Clinician: Date of Birth/Sex: Female) Treating ROBSON, MICHAEL Primary Care Niesha Bame: Eulas Post, MONICA Reece Fehnel/Extender: G Referring Saladin Petrelli: Gildardo Cranker Weeks in Treatment: 2 Encounter Discharge Information Items Discharge Pain Level: 0 Discharge Condition: Stable Ambulatory Status: Walker Discharge Destination: Home Transportation: Private Auto Accompanied By: self Schedule Follow-up Appointment: Yes Medication Reconciliation completed Yes and provided to Patient/Care Nakeysha Pasqual: Provided on Clinical Summary of Care: 08/22/2016 Form Type Recipient Paper Patient LS Electronic Signature(s) Signed: 08/22/2016 11:39:29 AM By: Gretta Cool, BSN, RN, CWS, Kim RN, BSN Previous Signature: 08/22/2016 11:32:32 AM Version By: Ruthine Dose Entered By: Gretta Cool BSN, RN, CWS, Kim on 08/22/2016 11:39:28 Shannon [name] ([id number]) -------------------------------------------------------------------------------- Lower Extremity Assessment Details Patient Name: Shannon [name] Date of Service: 08/22/2016 10:15 AM Medical Record Patient Account Number: [id number] Number: Treating RN: Montey Hora 1924-03-28 (83 y.o. Other Clinician: Date of Birth/Sex: Female) Treating ROBSON, MICHAEL Primary Care Raiden Yearwood: Eulas Post, MONICA Darshay Deupree/Extender: G Referring Javed Cotto: Eulas Post, MONICA Weeks in Treatment: 2 Vascular Assessment Pulses: Dorsalis Pedis Palpable: [Left:Yes] [Right:Yes] Posterior Tibial Extremity colors, hair growth, and conditions: Extremity Color: [Left:Normal] [Right:Normal] Hair Growth on Extremity: [Left:No] [Right:No] Temperature of Extremity: [Left:Warm] [Right:Warm] Capillary Refill: [Left:> 3 seconds] [Right:> 3 seconds] Electronic Signature(s) Signed: 08/22/2016 3:34:25 PM By: Montey Hora Entered By: Montey Hora on 08/22/2016 11:02:05 Shannon [name] ([id number]) -------------------------------------------------------------------------------- Multi Wound Chart Details Patient Name: Shannon [name] Date of Service: 08/22/2016 10:15 AM Medical Record Patient Account Number: [id number] Number: Treating RN: Cornell Barman [birth year] (83 y.o. 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  of the Lower Extremity the Lower Extremity the Lower Extremity Comorbid History: Cataracts, Anemia, Cataracts, Anemia, Cataracts, Anemia, Asthma, Chronic Asthma, Chronic Asthma, Chronic Obstructive Pulmonary Obstructive Pulmonary Obstructive Pulmonary Disease (COPD), Disease (COPD), Disease (COPD), Arrhythmia, Congestive Arrhythmia, Congestive Arrhythmia, Congestive Heart Failure, Coronary Heart Failure, Coronary Heart Failure, Coronary Artery Disease, Deep Vein Artery Disease, Deep Vein Artery Disease, Deep Vein Thrombosis, Thrombosis, Thrombosis, Hypertension, Type II Hypertension, Type II Hypertension, Type II Shannon [name], Shannon [name] ([id number]) Diabetes, Osteoarthritis, Diabetes, Osteoarthritis, Diabetes, Osteoarthritis, Osteomyelitis, Neuropathy Osteomyelitis, Neuropathy Osteomyelitis, Neuropathy Date Acquired: 07/18/2016 07/11/2016 07/11/2016 Weeks of Treatment: '2 2 2 ' Wound Status: Open Open Open Pending Amputation on No Yes No Presentation: Measurements L x W x D 0.4x0.4x0.1 9x2.5x0.1 1x1.3x0.1 (cm) Area (cm) : 0.126 17.671 1.021 Volume (cm) : 0.013 1.767 0.102 % Reduction in Area: 46.60% 0.00% 13.30% % Reduction in Volume: 45.80% 0.00% 13.60% Classification: Grade 1 Unable to visualize wound Unable to visualize wound bed bed Exudate  Amount: Medium Medium None Present Exudate Type: Serosanguineous Serosanguineous N/A Exudate Color: red, brown red, brown N/A Wound Margin: Distinct, outline attached Indistinct, nonvisible Distinct, outline attached Granulation Amount: None Present (0%) None Present (0%) None Present (0%) Necrotic Amount: Large (67-100%) Large (67-100%) Large (67-100%) Necrotic Tissue: Eschar Eschar, Adherent Slough Eschar Exposed Structures: Fascia: No Fascia: No Fascia: No Fat Layer (Subcutaneous Fat Layer (Subcutaneous Fat Layer (Subcutaneous Tissue) Exposed: No Tissue) Exposed: No Tissue) Exposed: No Tendon: No Tendon: No Tendon: No Muscle: No Muscle: No Muscle: No Joint: No Joint: No Joint: No Bone: No Bone: No Bone: No Limited to Skin Limited to Skin Limited to Skin Breakdown Breakdown Breakdown Epithelialization: None None None Periwound Skin Texture: Callus: Yes Excoriation: No Excoriation: No Excoriation: No Induration: No Induration: No Induration: No Callus: No Callus: No Crepitus: No Crepitus: No Crepitus: No Rash: No Rash: No Rash: No Scarring: No Scarring: No Scarring: No Periwound Skin Dry/Scaly: Yes Maceration: No Dry/Scaly: Yes Moisture: Maceration: No Dry/Scaly: No Maceration: No Periwound Skin Color: Hemosiderin Staining: Yes Atrophie Blanche: No Atrophie Blanche: No Atrophie Blanche: No Cyanosis: No Cyanosis: No Cyanosis: No Ecchymosis: No Ecchymosis: No Ecchymosis: No Erythema: No Erythema: No Erythema: No Hemosiderin Staining: No Hemosiderin Staining: No Mottled: No Mottled: No Mottled: No Pallor: No Pallor: No Pallor: No Rubor: No Rubor: No Rubor: No Temperature: No Abnormality No Abnormality No Abnormality Yes Yes No Shannon [name], Shannon [name] ([id number]) Tenderness on Palpation: Wound Preparation: Ulcer Cleansing: Ulcer Cleansing: Ulcer Cleansing: Rinsed/Irrigated with Rinsed/Irrigated with Rinsed/Irrigated with Saline Saline  Saline Topical Anesthetic Topical Anesthetic Topical Anesthetic Applied: Other: lidocaine Applied: Other: lidocaine Applied: Other: lidocaine 4% 4% 2% Treatment Notes Wound #1 (Right, Medial Toe Great) 1. Cleansed with: Clean wound with Normal Saline 4. Dressing Applied: Aquacel Ag 5. Secondary Dressing Applied Gauze and Kerlix/Conform 7. Secured with Tape Wound #2 (Left, Circumferential Toe Second) 1. Cleansed with: Clean wound with Normal Saline 4. Dressing Applied: Aquacel Ag 5. Secondary Dressing Applied Gauze and Kerlix/Conform 7. Secured with Tape Wound #3 (Left, Medial Toe Great) 1. Cleansed with: Clean wound with Normal Saline 4. Dressing Applied: Aquacel Ag 5. Secondary Dressing Applied Gauze and Kerlix/Conform 7. Secured with Recruitment consultant) Signed: 08/22/2016 3:44:58 PM By: Linton Ham MD Entered By: Linton Ham on 08/22/2016 12:04:46 Shannon [name] ([id number]) Shannon [name], Shannon [name] ([id number]) -------------------------------------------------------------------------------- Multi-Disciplinary Care Plan Details Patient Name: Shannon [name] Date of Service: 08/22/2016 10:15 AM Medical Record Patient Account Number: [id number] Number: Treating RN: Cornell Barman 05/20/1923 (83 y.o. Other Clinician: Date of Birth/Sex: Female) Treating ROBSON, MICHAEL  5 X - Wound Tracing (instead of photographs) 1 5 '[]'  - Simple Wound Measurement - one wound 0 X - Complex Wound Measurement - multiple wounds 3 5 INTERVENTIONS - Wound Dressings '[]'  - Small Wound Dressing one or multiple wounds 0 X - Medium Wound Dressing one or multiple wounds 2 15 '[]'  - Large Wound Dressing one or multiple wounds 0 '[]'  - Application of Medications - topical 0 '[]'  - Application of Medications - injection 0 Shannon [name], Shannon [name] ([id number]) INTERVENTIONS - Miscellaneous '[]'  - External ear exam 0 '[]'  - Specimen Collection (cultures, biopsies, blood, body fluids, etc.) 0 '[]'  - Specimen(s) / Culture(s) sent or taken to Lab for analysis 0 '[]'  - Patient Transfer (multiple staff / Harrel Lemon Lift / Similar devices) 0 '[]'  - Simple Staple / Suture removal (25 or less) 0 '[]'  - Complex Staple / Suture removal (26 or more) 0 '[]'  - Hypo / Hyperglycemic Management (close monitor of Blood Glucose) 0 '[]'  - Ankle / Brachial Index (ABI) - do not check if billed separately 0 X - Vital Signs 1 5 Has the patient been seen at the hospital within the last three years: Yes Total Score: 130 Level Of Care: New/Established - Level 4 Electronic Signature(s) Signed: 08/22/2016 3:34:22 PM By: Gretta Cool, BSN, RN, CWS, Kim RN, BSN Entered By: Gretta Cool, BSN, RN, CWS, Kim on 08/22/2016 11:37:31 Shannon [name] ([id number]) -------------------------------------------------------------------------------- Encounter Discharge Information Details Patient Name: Shannon [name] Date of Service: 08/22/2016 10:15 AM Medical Record Patient Account Number: [id number] Number: Treating RN: Montey Hora Oct 05, 1923 (83 y.o. Other Clinician: Date of Birth/Sex: Female) Treating ROBSON, MICHAEL Primary Care Niesha Bame: Eulas Post, MONICA Reece Fehnel/Extender: G Referring Saladin Petrelli: Gildardo Cranker Weeks in Treatment: 2 Encounter Discharge Information Items Discharge Pain Level: 0 Discharge Condition: Stable Ambulatory Status: Walker Discharge Destination: Home Transportation: Private Auto Accompanied By: self Schedule Follow-up Appointment: Yes Medication Reconciliation completed Yes and provided to Patient/Care Nakeysha Pasqual: Provided on Clinical Summary of Care: 08/22/2016 Form Type Recipient Paper Patient LS Electronic Signature(s) Signed: 08/22/2016 11:39:29 AM By: Gretta Cool, BSN, RN, CWS, Kim RN, BSN Previous Signature: 08/22/2016 11:32:32 AM Version By: Ruthine Dose Entered By: Gretta Cool BSN, RN, CWS, Kim on 08/22/2016 11:39:28 Shannon [name] ([id number]) -------------------------------------------------------------------------------- Lower Extremity Assessment Details Patient Name: Shannon [name] Date of Service: 08/22/2016 10:15 AM Medical Record Patient Account Number: [id number] Number: Treating RN: Montey Hora 1924-03-28 (83 y.o. Other Clinician: Date of Birth/Sex: Female) Treating ROBSON, MICHAEL Primary Care Raiden Yearwood: Eulas Post, MONICA Darshay Deupree/Extender: G Referring Javed Cotto: Eulas Post, MONICA Weeks in Treatment: 2 Vascular Assessment Pulses: Dorsalis Pedis Palpable: [Left:Yes] [Right:Yes] Posterior Tibial Extremity colors, hair growth, and conditions: Extremity Color: [Left:Normal] [Right:Normal] Hair Growth on Extremity: [Left:No] [Right:No] Temperature of Extremity: [Left:Warm] [Right:Warm] Capillary Refill: [Left:> 3 seconds] [Right:> 3 seconds] Electronic Signature(s) Signed: 08/22/2016 3:34:25 PM By: Montey Hora Entered By: Montey Hora on 08/22/2016 11:02:05 Shannon [name] ([id number]) -------------------------------------------------------------------------------- Multi Wound Chart Details Patient Name: Shannon [name] Date of Service: 08/22/2016 10:15 AM Medical Record Patient Account Number: [id number] Number: Treating RN: Cornell Barman [birth year] (83 y.o. 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  5 X - Wound Tracing (instead of photographs) 1 5 '[]'  - Simple Wound Measurement - one wound 0 X - Complex Wound Measurement - multiple wounds 3 5 INTERVENTIONS - Wound Dressings '[]'  - Small Wound Dressing one or multiple wounds 0 X - Medium Wound Dressing one or multiple wounds 2 15 '[]'  - Large Wound Dressing one or multiple wounds 0 '[]'  - Application of Medications - topical 0 '[]'  - Application of Medications - injection 0 Shannon [name], Shannon [name] ([id number]) INTERVENTIONS - Miscellaneous '[]'  - External ear exam 0 '[]'  - Specimen Collection (cultures, biopsies, blood, body fluids, etc.) 0 '[]'  - Specimen(s) / Culture(s) sent or taken to Lab for analysis 0 '[]'  - Patient Transfer (multiple staff / Harrel Lemon Lift / Similar devices) 0 '[]'  - Simple Staple / Suture removal (25 or less) 0 '[]'  - Complex Staple / Suture removal (26 or more) 0 '[]'  - Hypo / Hyperglycemic Management (close monitor of Blood Glucose) 0 '[]'  - Ankle / Brachial Index (ABI) - do not check if billed separately 0 X - Vital Signs 1 5 Has the patient been seen at the hospital within the last three years: Yes Total Score: 130 Level Of Care: New/Established - Level 4 Electronic Signature(s) Signed: 08/22/2016 3:34:22 PM By: Gretta Cool, BSN, RN, CWS, Kim RN, BSN Entered By: Gretta Cool, BSN, RN, CWS, Kim on 08/22/2016 11:37:31 Shannon [name] ([id number]) -------------------------------------------------------------------------------- Encounter Discharge Information Details Patient Name: Shannon [name] Date of Service: 08/22/2016 10:15 AM Medical Record Patient Account Number: [id number] Number: Treating RN: Montey Hora Oct 05, 1923 (83 y.o. Other Clinician: Date of Birth/Sex: Female) Treating ROBSON, MICHAEL Primary Care Niesha Bame: Eulas Post, MONICA Reece Fehnel/Extender: G Referring Saladin Petrelli: Gildardo Cranker Weeks in Treatment: 2 Encounter Discharge Information Items Discharge Pain Level: 0 Discharge Condition: Stable Ambulatory Status: Walker Discharge Destination: Home Transportation: Private Auto Accompanied By: self Schedule Follow-up Appointment: Yes Medication Reconciliation completed Yes and provided to Patient/Care Nakeysha Pasqual: Provided on Clinical Summary of Care: 08/22/2016 Form Type Recipient Paper Patient LS Electronic Signature(s) Signed: 08/22/2016 11:39:29 AM By: Gretta Cool, BSN, RN, CWS, Kim RN, BSN Previous Signature: 08/22/2016 11:32:32 AM Version By: Ruthine Dose Entered By: Gretta Cool BSN, RN, CWS, Kim on 08/22/2016 11:39:28 Shannon [name] ([id number]) -------------------------------------------------------------------------------- Lower Extremity Assessment Details Patient Name: Shannon [name] Date of Service: 08/22/2016 10:15 AM Medical Record Patient Account Number: [id number] Number: Treating RN: Montey Hora 1924-03-28 (83 y.o. Other Clinician: Date of Birth/Sex: Female) Treating ROBSON, MICHAEL Primary Care Raiden Yearwood: Eulas Post, MONICA Darshay Deupree/Extender: G Referring Javed Cotto: Eulas Post, MONICA Weeks in Treatment: 2 Vascular Assessment Pulses: Dorsalis Pedis Palpable: [Left:Yes] [Right:Yes] Posterior Tibial Extremity colors, hair growth, and conditions: Extremity Color: [Left:Normal] [Right:Normal] Hair Growth on Extremity: [Left:No] [Right:No] Temperature of Extremity: [Left:Warm] [Right:Warm] Capillary Refill: [Left:> 3 seconds] [Right:> 3 seconds] Electronic Signature(s) Signed: 08/22/2016 3:34:25 PM By: Montey Hora Entered By: Montey Hora on 08/22/2016 11:02:05 Shannon [name] ([id number]) -------------------------------------------------------------------------------- Multi Wound Chart Details Patient Name: Shannon [name] Date of Service: 08/22/2016 10:15 AM Medical Record Patient Account Number: [id number] Number: Treating RN: Cornell Barman [birth year] (83 y.o. 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  Initiated: 08/08/2016 Target Resolution Date: 11/10/2016 Goal Status: Active Ulcer/skin breakdown will have a volume reduction of 50% by week 8 Date Initiated: 08/08/2016 Target Resolution Date: 11/10/2016 Goal Status: Active Ulcer/skin breakdown will have a volume reduction of 80% by week 12 Date Initiated: 08/08/2016 Target Resolution Date: 11/10/2016 Goal Status: Active Ulcer/skin breakdown will heal within 14 weeks Date Initiated: 08/08/2016 Target Resolution Date: 11/10/2016 Goal Status: Active Interventions: Assess patient/caregiver ability to obtain necessary supplies Assess patient/caregiver ability to perform ulcer/skin care regimen upon admission and as needed Assess ulceration(s) every visit Provide education on ulcer and skin care Treatment Activities: Referred to DME Janyah Singleterry for dressing supplies : 08/08/2016 Skin care regimen initiated : 08/08/2016 Topical wound management initiated : 08/08/2016 Notes: Electronic Signature(s) Signed: 08/22/2016 3:34:22 PM By: Gretta Cool, BSN, RN, CWS, Kim RN, BSN Entered By: Gretta Cool, BSN, RN, CWS, Kim on 08/22/2016 11:12:40 Shannon [name] ([id number]) Shannon [name], Shannon [name] ([id number]) -------------------------------------------------------------------------------- Pain Assessment Details Patient Name: Shannon [name] Date of Service: 08/22/2016 10:15 AM Medical Record Patient Account Number: [id number] Number: Treating RN: Montey Hora 1924/03/05 (83 y.o. Other Clinician: Date of Birth/Sex: Female) Treating ROBSON, MICHAEL Primary Care Tavon Corriher: Eulas Post, MONICA Marqual Mi/Extender: G Referring Karon Heckendorn: Eulas Post MONICA Weeks in Treatment: 2 Active  Problems Location of Pain Severity and Description of Pain Patient Has Paino Yes Site Locations Pain Location: Pain in Ulcers With Dressing Change: Yes Duration of the Pain. Constant / Intermittento Intermittent Pain Management and Medication Current Pain Management: Electronic Signature(s) Signed: 08/22/2016 3:34:25 PM By: Montey Hora Entered By: Montey Hora on 08/22/2016 10:50:08 Shannon [name] ([id number]) -------------------------------------------------------------------------------- Patient/Caregiver Education Details Patient Name: Shannon [name] Date of Service: 08/22/2016 10:15 AM Medical Record Patient Account Number: [id number] Number: Treating RN: Cornell Barman 1924/02/12 (83 y.o. Other Clinician: Date of Birth/Gender: Female) Treating ROBSON, MICHAEL Primary Care Physician: Eulas Post, MONICA Physician/Extender: G Referring Physician: Sherron Monday in Treatment: 2 Education Assessment Education Provided To: Caregiver Education Topics Provided Wound/Skin Impairment: Handouts: Caring for Your Ulcer Methods: Demonstration, Explain/Verbal Responses: State content correctly Electronic Signature(s) Signed: 08/22/2016 3:34:22 PM By: Gretta Cool, BSN, RN, CWS, Kim RN, BSN Entered By: Gretta Cool, BSN, RN, CWS, Kim on 08/22/2016 11:39:46 Shannon [name] ([id number]) -------------------------------------------------------------------------------- Wound Assessment Details Patient Name: Shannon [name] Date of Service: 08/22/2016 10:15 AM Medical Record Patient Account Number: [id number] Number: Treating RN: Montey Hora 25-Oct-1923 (83 y.o. Other Clinician: Date of Birth/Sex: Female) Treating ROBSON, MICHAEL Primary Care Wali Reinheimer: Eulas Post, MONICA Emira Eubanks/Extender: G Referring Alejah Aristizabal: Eulas Post, MONICA Weeks in Treatment: 2 Wound Status Wound Number: 1 Primary Diabetic Wound/Ulcer of the Lower Etiology: Extremity Wound Location: Right Toe Great - Medial Wound  Open Wounding Event: Gradually Appeared Status: Date Acquired: 07/18/2016 Comorbid Cataracts, Anemia, Asthma, Chronic Weeks Of Treatment: 2 History: Obstructive Pulmonary Disease (COPD), Clustered Wound: No Arrhythmia, Congestive Heart Failure, Coronary Artery Disease, Deep Vein Thrombosis, Hypertension, Type II Diabetes, Osteoarthritis, Osteomyelitis, Neuropathy Photos Photo Uploaded By: Montey Hora on 08/22/2016 11:37:15 Wound Measurements Length: (cm) 0.4 Width: (cm) 0.4 Depth: (cm) 0.1 Area: (cm) 0.126 Volume: (cm) 0.013 % Reduction in Area: 46.6% % Reduction in Volume: 45.8% Epithelialization: None Tunneling: No Undermining: No Wound Description Classification: Grade 1 Foul Odor After Wound Margin: Distinct, outline attached Slough/Fibrino Exudate Amount: Medium Exudate Type: Serosanguineous Exudate Color: red, brown Shannon [name], Shannon [name] ([id number]) Cleansing: No Yes Wound Bed Granulation Amount: None Present (0%) Exposed Structure Necrotic Amount: Large (67-100%) Fascia Exposed: No Necrotic Quality: Eschar Fat Layer (Subcutaneous Tissue) Exposed: No Tendon Exposed: No Muscle Exposed: No Joint Exposed: No Bone Exposed: No Limited  of the Lower Extremity the Lower Extremity the Lower Extremity Comorbid History: Cataracts, Anemia, Cataracts, Anemia, Cataracts, Anemia, Asthma, Chronic Asthma, Chronic Asthma, Chronic Obstructive Pulmonary Obstructive Pulmonary Obstructive Pulmonary Disease (COPD), Disease (COPD), Disease (COPD), Arrhythmia, Congestive Arrhythmia, Congestive Arrhythmia, Congestive Heart Failure, Coronary Heart Failure, Coronary Heart Failure, Coronary Artery Disease, Deep Vein Artery Disease, Deep Vein Artery Disease, Deep Vein Thrombosis, Thrombosis, Thrombosis, Hypertension, Type II Hypertension, Type II Hypertension, Type II Shannon [name], Shannon [name] ([id number]) Diabetes, Osteoarthritis, Diabetes, Osteoarthritis, Diabetes, Osteoarthritis, Osteomyelitis, Neuropathy Osteomyelitis, Neuropathy Osteomyelitis, Neuropathy Date Acquired: 07/18/2016 07/11/2016 07/11/2016 Weeks of Treatment: '2 2 2 ' Wound Status: Open Open Open Pending Amputation on No Yes No Presentation: Measurements L x W x D 0.4x0.4x0.1 9x2.5x0.1 1x1.3x0.1 (cm) Area (cm) : 0.126 17.671 1.021 Volume (cm) : 0.013 1.767 0.102 % Reduction in Area: 46.60% 0.00% 13.30% % Reduction in Volume: 45.80% 0.00% 13.60% Classification: Grade 1 Unable to visualize wound Unable to visualize wound bed bed Exudate  Amount: Medium Medium None Present Exudate Type: Serosanguineous Serosanguineous N/A Exudate Color: red, brown red, brown N/A Wound Margin: Distinct, outline attached Indistinct, nonvisible Distinct, outline attached Granulation Amount: None Present (0%) None Present (0%) None Present (0%) Necrotic Amount: Large (67-100%) Large (67-100%) Large (67-100%) Necrotic Tissue: Eschar Eschar, Adherent Slough Eschar Exposed Structures: Fascia: No Fascia: No Fascia: No Fat Layer (Subcutaneous Fat Layer (Subcutaneous Fat Layer (Subcutaneous Tissue) Exposed: No Tissue) Exposed: No Tissue) Exposed: No Tendon: No Tendon: No Tendon: No Muscle: No Muscle: No Muscle: No Joint: No Joint: No Joint: No Bone: No Bone: No Bone: No Limited to Skin Limited to Skin Limited to Skin Breakdown Breakdown Breakdown Epithelialization: None None None Periwound Skin Texture: Callus: Yes Excoriation: No Excoriation: No Excoriation: No Induration: No Induration: No Induration: No Callus: No Callus: No Crepitus: No Crepitus: No Crepitus: No Rash: No Rash: No Rash: No Scarring: No Scarring: No Scarring: No Periwound Skin Dry/Scaly: Yes Maceration: No Dry/Scaly: Yes Moisture: Maceration: No Dry/Scaly: No Maceration: No Periwound Skin Color: Hemosiderin Staining: Yes Atrophie Blanche: No Atrophie Blanche: No Atrophie Blanche: No Cyanosis: No Cyanosis: No Cyanosis: No Ecchymosis: No Ecchymosis: No Ecchymosis: No Erythema: No Erythema: No Erythema: No Hemosiderin Staining: No Hemosiderin Staining: No Mottled: No Mottled: No Mottled: No Pallor: No Pallor: No Pallor: No Rubor: No Rubor: No Rubor: No Temperature: No Abnormality No Abnormality No Abnormality Yes Yes No Shannon [name], Shannon [name] ([id number]) Tenderness on Palpation: Wound Preparation: Ulcer Cleansing: Ulcer Cleansing: Ulcer Cleansing: Rinsed/Irrigated with Rinsed/Irrigated with Rinsed/Irrigated with Saline Saline  Saline Topical Anesthetic Topical Anesthetic Topical Anesthetic Applied: Other: lidocaine Applied: Other: lidocaine Applied: Other: lidocaine 4% 4% 2% Treatment Notes Wound #1 (Right, Medial Toe Great) 1. Cleansed with: Clean wound with Normal Saline 4. Dressing Applied: Aquacel Ag 5. Secondary Dressing Applied Gauze and Kerlix/Conform 7. Secured with Tape Wound #2 (Left, Circumferential Toe Second) 1. Cleansed with: Clean wound with Normal Saline 4. Dressing Applied: Aquacel Ag 5. Secondary Dressing Applied Gauze and Kerlix/Conform 7. Secured with Tape Wound #3 (Left, Medial Toe Great) 1. Cleansed with: Clean wound with Normal Saline 4. Dressing Applied: Aquacel Ag 5. Secondary Dressing Applied Gauze and Kerlix/Conform 7. Secured with Recruitment consultant) Signed: 08/22/2016 3:44:58 PM By: Linton Ham MD Entered By: Linton Ham on 08/22/2016 12:04:46 Shannon [name] ([id number]) Shannon [name], Shannon [name] ([id number]) -------------------------------------------------------------------------------- Multi-Disciplinary Care Plan Details Patient Name: Shannon [name] Date of Service: 08/22/2016 10:15 AM Medical Record Patient Account Number: [id number] Number: Treating RN: Cornell Barman 05/20/1923 (83 y.o. Other Clinician: Date of Birth/Sex: Female) Treating ROBSON, MICHAEL

## 2016-08-24 DIAGNOSIS — J441 Chronic obstructive pulmonary disease with (acute) exacerbation: Secondary | ICD-10-CM | POA: Diagnosis not present

## 2016-08-24 DIAGNOSIS — I2510 Atherosclerotic heart disease of native coronary artery without angina pectoris: Secondary | ICD-10-CM | POA: Diagnosis not present

## 2016-08-24 DIAGNOSIS — E11621 Type 2 diabetes mellitus with foot ulcer: Secondary | ICD-10-CM | POA: Diagnosis not present

## 2016-08-24 DIAGNOSIS — L97521 Non-pressure chronic ulcer of other part of left foot limited to breakdown of skin: Secondary | ICD-10-CM | POA: Diagnosis not present

## 2016-08-24 DIAGNOSIS — L97523 Non-pressure chronic ulcer of other part of left foot with necrosis of muscle: Secondary | ICD-10-CM | POA: Diagnosis not present

## 2016-08-24 DIAGNOSIS — I130 Hypertensive heart and chronic kidney disease with heart failure and stage 1 through stage 4 chronic kidney disease, or unspecified chronic kidney disease: Secondary | ICD-10-CM | POA: Diagnosis not present

## 2016-08-24 IMAGING — CR DG UGI W/ SMALL BOWEL
3 series · 3 of 3 positions shown · non-contrast
Comparison: Abdominal film March 15, 2014.

CLINICAL DATA: Nausea and vomiting; history of bezoar.

EXAM:
UPPER GI SERIES WITH SMALL BOWEL FOLLOW-THROUGH using thin barium.
FLUOROSCOPY TIME:  1 min, 17 seconds
TECHNIQUE: Combined double contrast and single contrast upper GI series using
effervescent crystals, thick barium, and thin barium. Subsequently,
serial images of the small bowel were obtained including spot views
of the terminal ileum.

[run (1 of 2)]
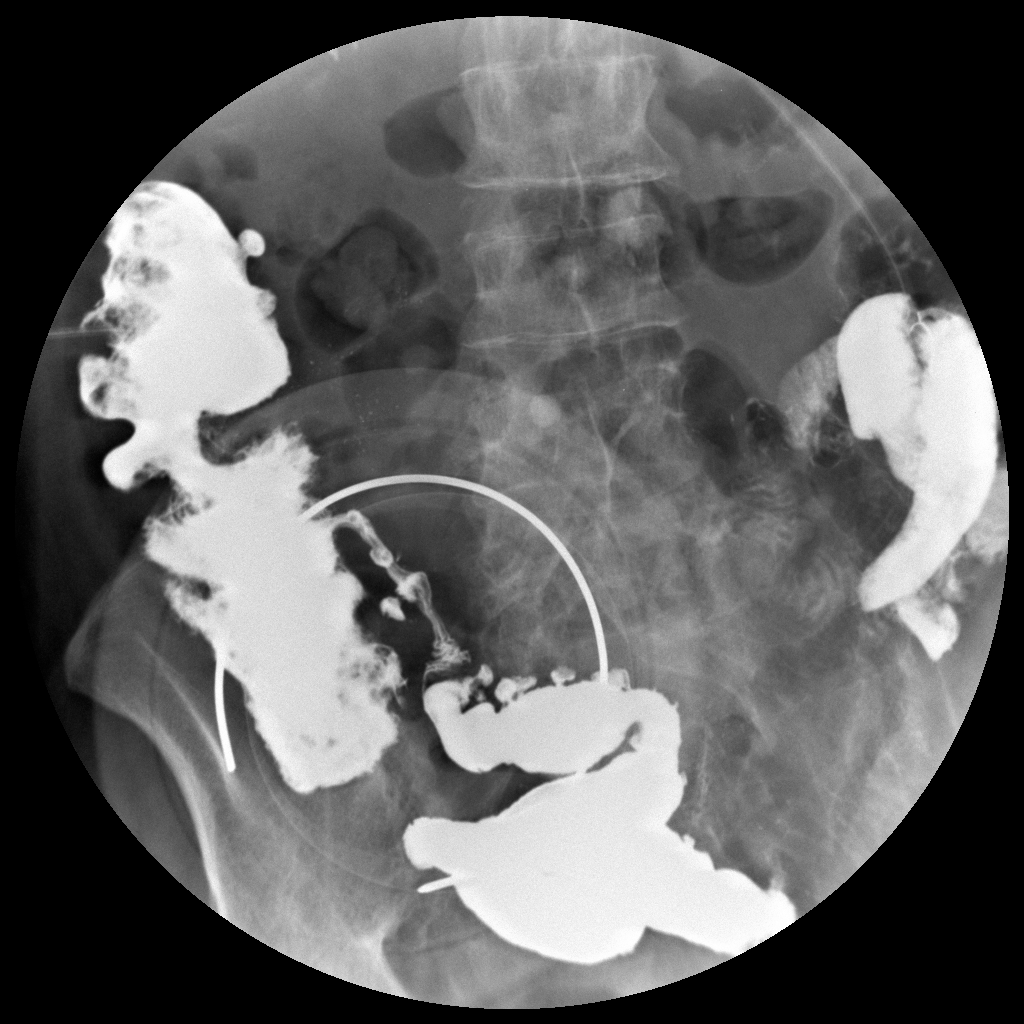

[run (2 of 2)]
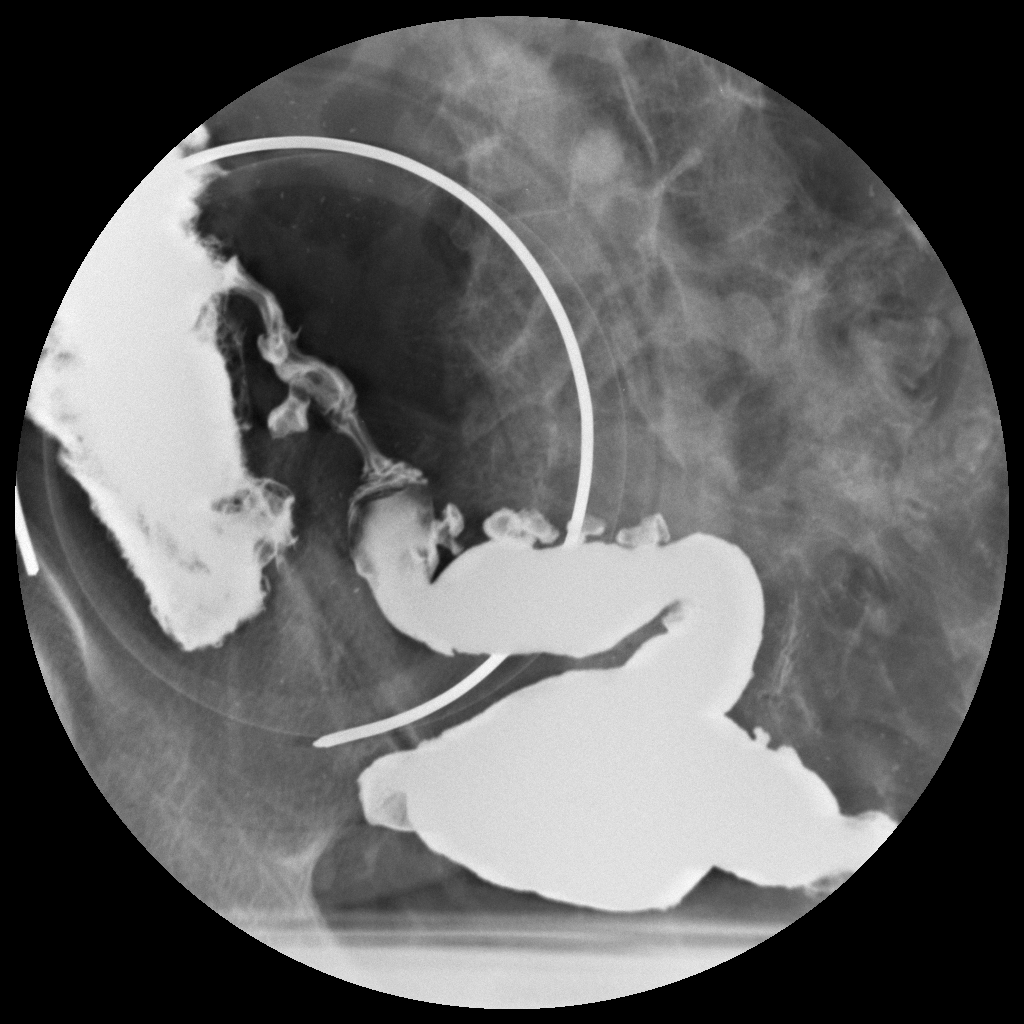

[view not recorded]
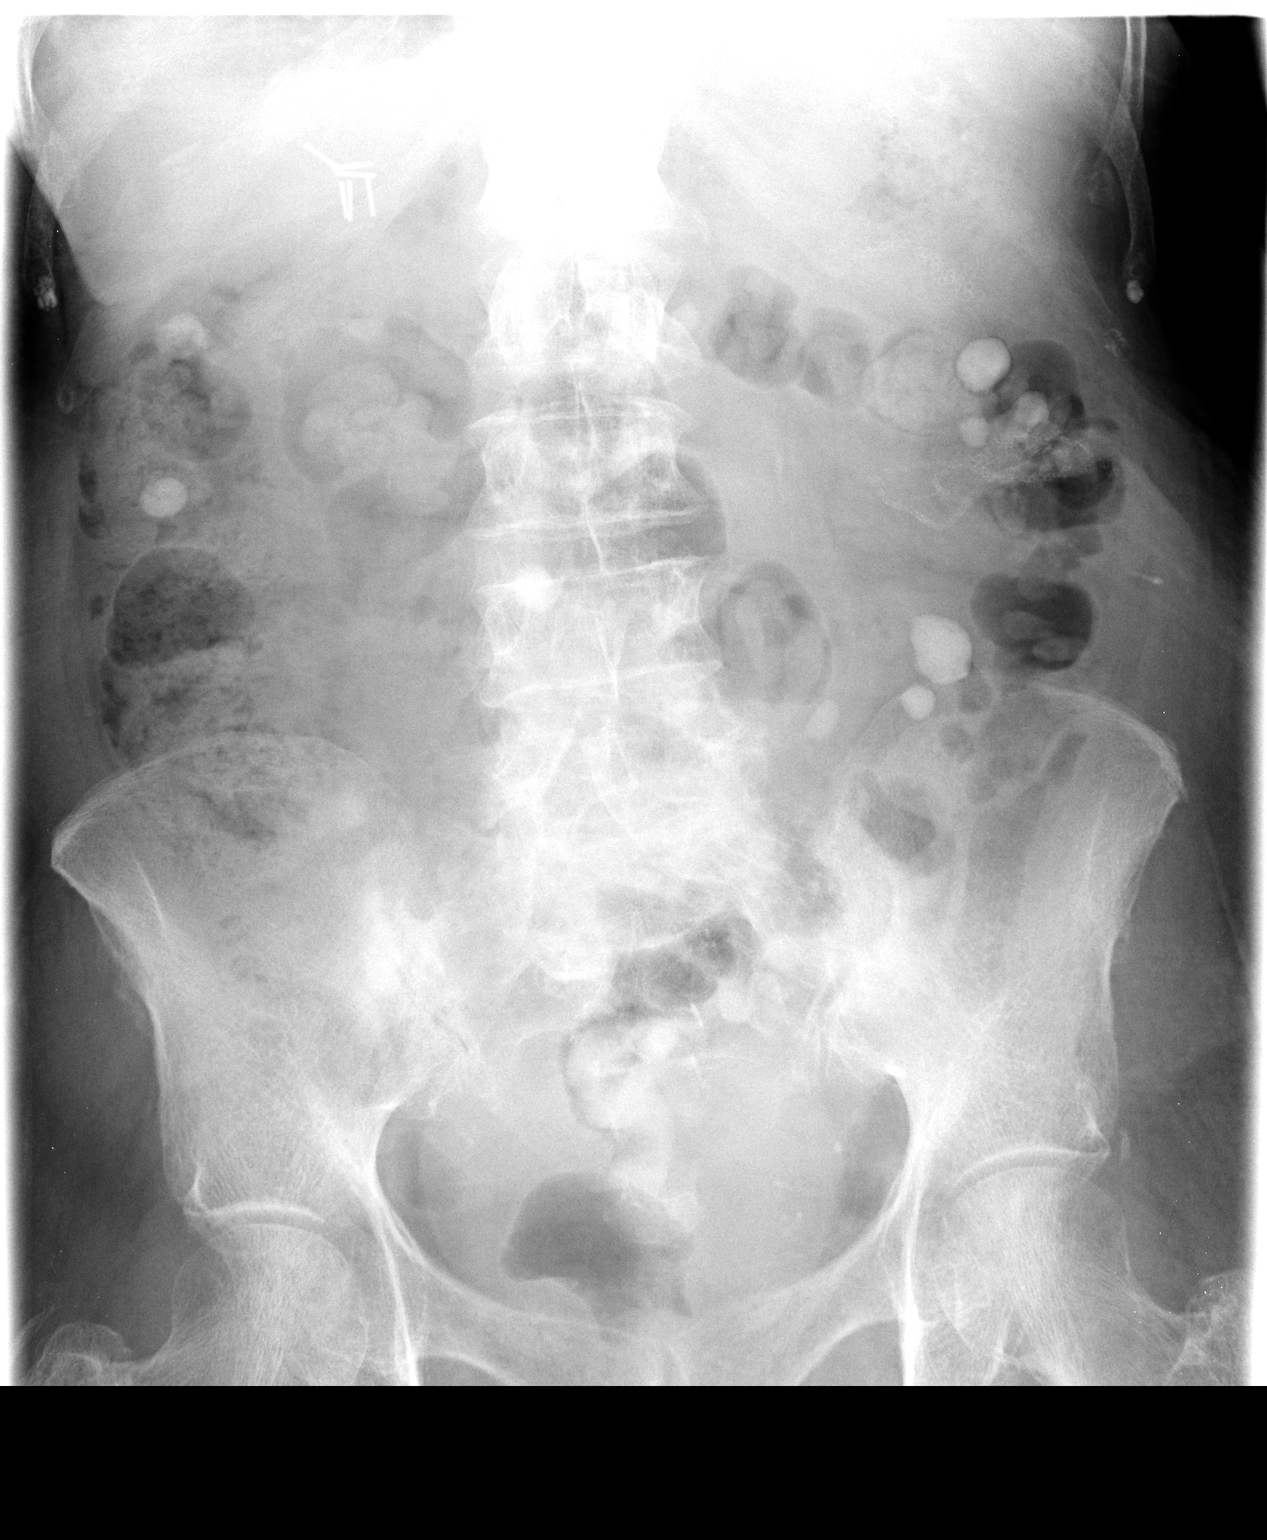

[3 of 3 positions shown; findings below may reference images not displayed]

FINDINGS: Scout films from [DATE] and today were reviewed. A moderate
stool burden in the right colon was noted. Scattered diverticula
containing radiodense contrast were noted in the left colon.

The patient ingested the barium without difficulty. The thoracic
esophagus demonstrated mild changes of presbyesophagus but no
evidence of a stricture. There was small non reducible hiatal
hernia. The stomach distended reasonably well. The rugal folds were
mildly prominent. No ulcer niche was demonstrated. No bezoar was
demonstrated. Gastric emptying was prompt. The duodenal bulb and C
sweep were normally positioned. Barium moved into the proximal
jejunum within 10 min of ingestion of barium preparations.

Over the course of 3 hr and 35 min the barium traversed the small
bowel. The jejunum demonstrated a normal feathery mucosal pattern.
The ileum demonstrated a normal pattern as well. The terminal ileum
was observed fluoroscopically and was normal. The patient voiced no
focal tenderness to palpation over the abdomen.
IMPRESSION: 1. No residual bezoar is demonstrated. There is a small hiatal
hernia and the rugal folds of the stomach are mildly prominent.
Gastric emptying is normal.
2. The duodenum, jejunum, and ileum demonstrate no acute
abnormality. There is no evidence of obstruction.

## 2016-08-25 IMAGING — RF DG UGI W/ SMALL BOWEL
7 of 20 series · 7 of 20 positions shown · non-contrast
Comparison: Abdominal film March 15, 2014.

CLINICAL DATA: Nausea and vomiting; history of bezoar.

EXAM:
UPPER GI SERIES WITH SMALL BOWEL FOLLOW-THROUGH using thin barium.
FLUOROSCOPY TIME:  1 min, 17 seconds
TECHNIQUE: Combined double contrast and single contrast upper GI series using
effervescent crystals, thick barium, and thin barium. Subsequently,
serial images of the small bowel were obtained including spot views
of the terminal ileum.

[Series 1: abdomen supine · 0.14mm/px · 1 of 1 slices shown]
[im 1/1]
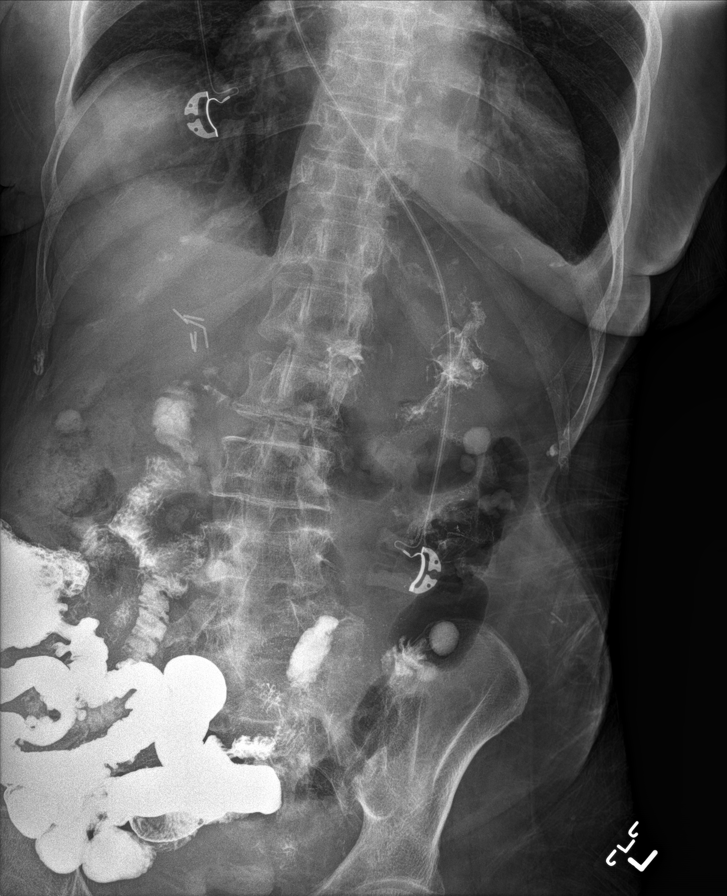

[Series 1: run · 1 of 1 slices shown (1 of 5)]
[im 1/1]
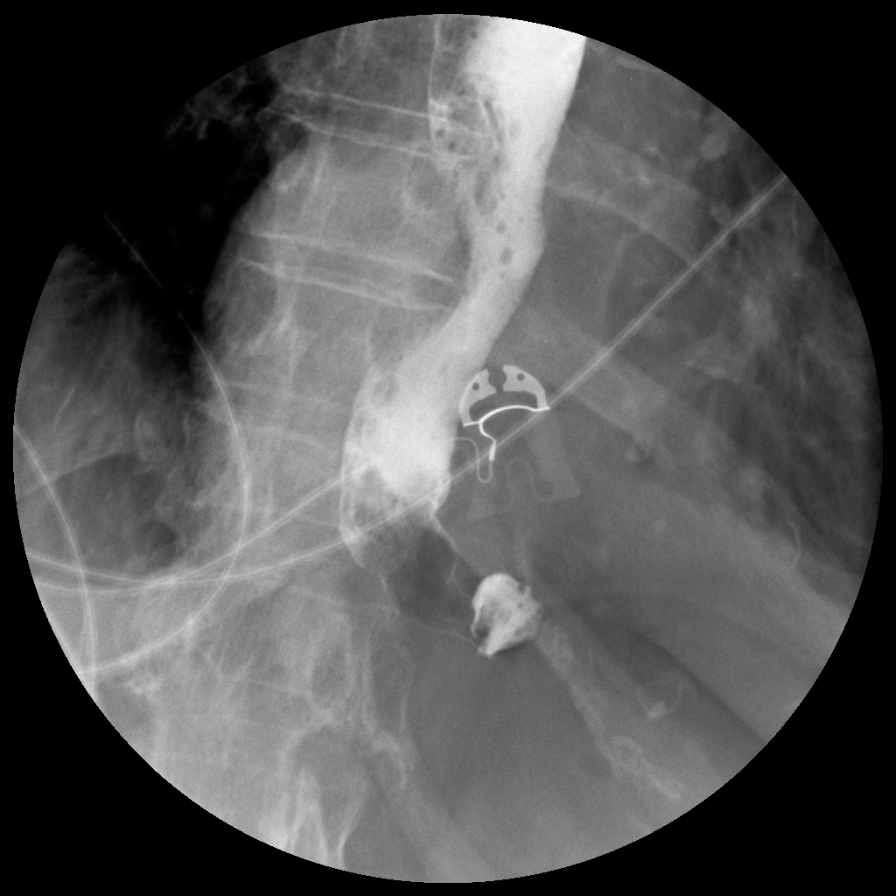

[Series 2: run · 1 of 1 slices shown (2 of 5)]
[im 1/1]
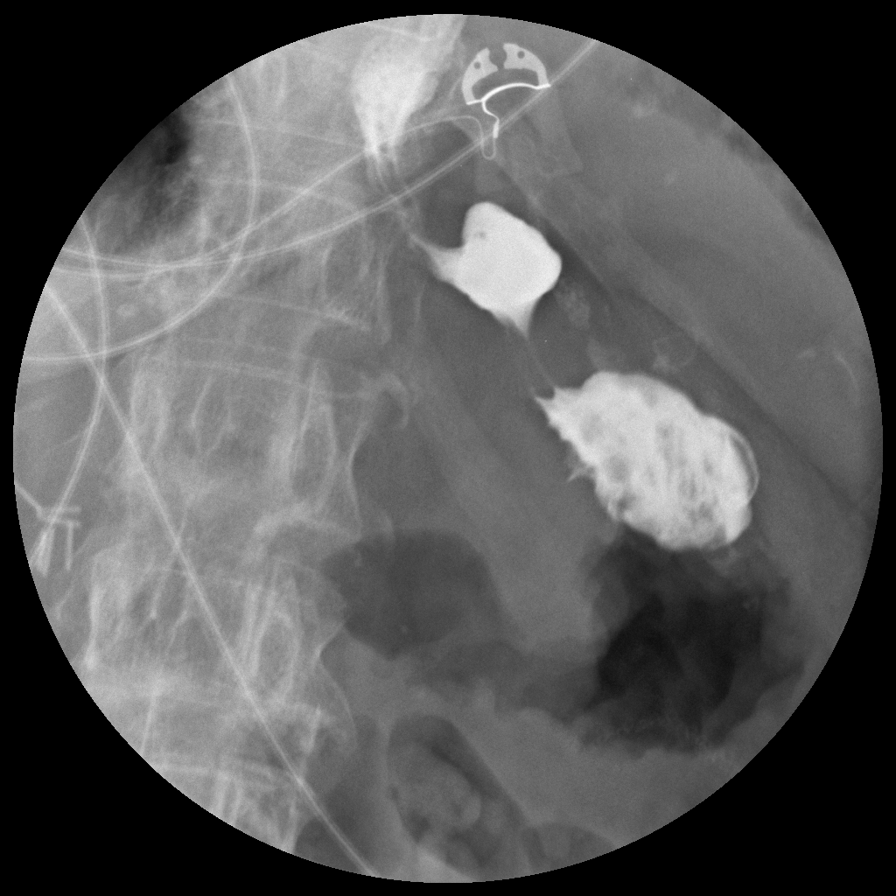

[Series 3: run · 1 of 1 slices shown (3 of 5)]
[im 1/1]
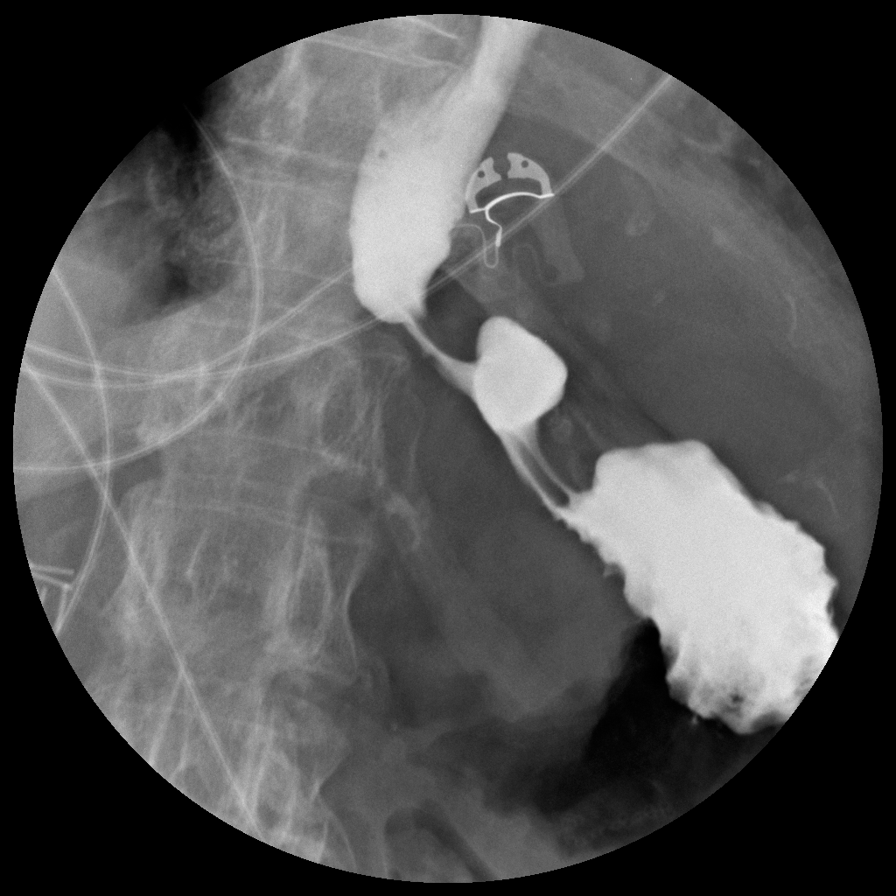

[Series 3: abdomen kub · 0.14mm/px · 1 of 1 slices shown]
[im 1/1]
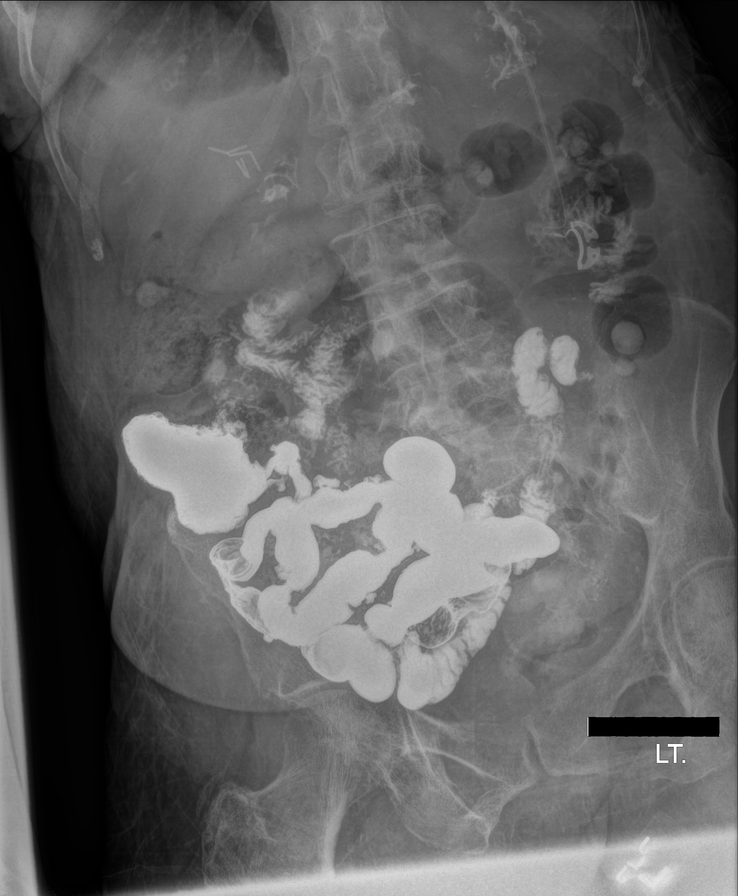

[Series 4: run · 1 of 1 slices shown (4 of 5)]
[im 1/1]
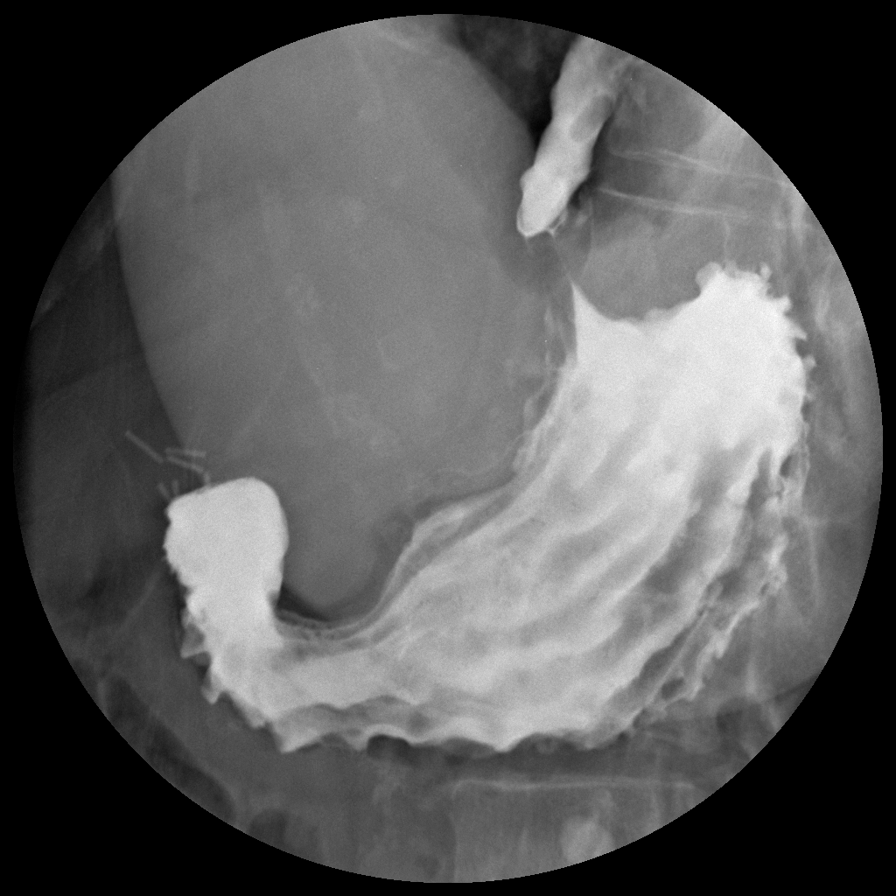

[Series 5: run · 1 of 1 slices shown (5 of 5)]
[im 1/1]
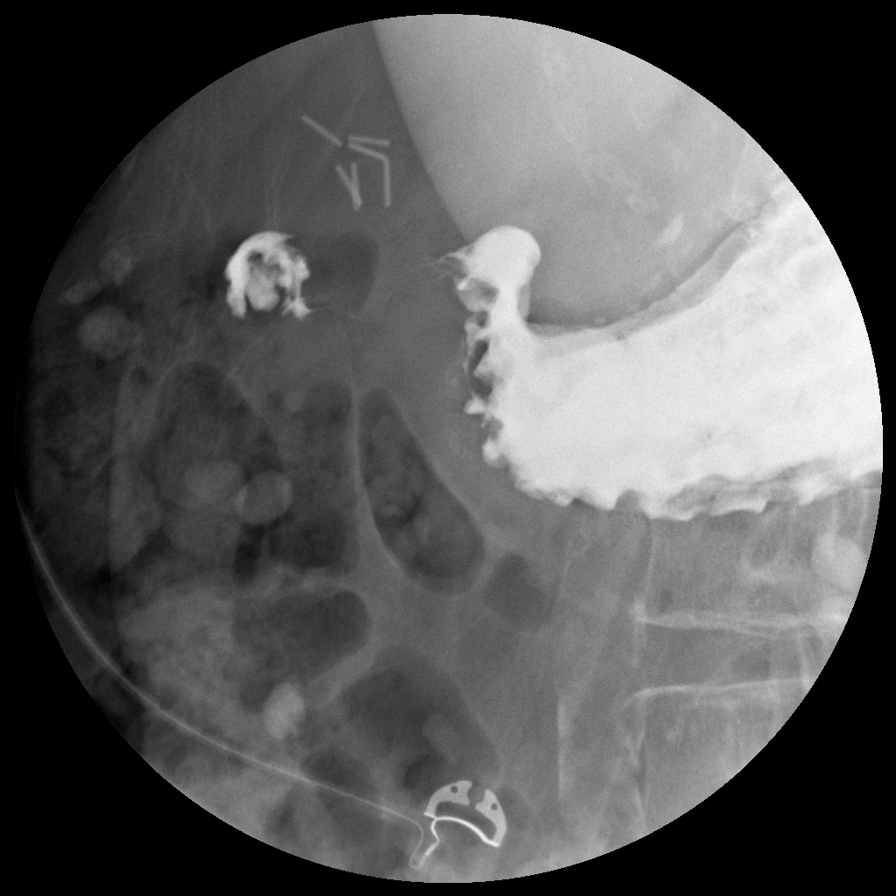

[7 of 20 positions shown; findings below may reference images not displayed]

FINDINGS: Scout films from [DATE] and today were reviewed. A moderate
stool burden in the right colon was noted. Scattered diverticula
containing radiodense contrast were noted in the left colon.

The patient ingested the barium without difficulty. The thoracic
esophagus demonstrated mild changes of presbyesophagus but no
evidence of a stricture. There was small non reducible hiatal
hernia. The stomach distended reasonably well. The rugal folds were
mildly prominent. No ulcer niche was demonstrated. No bezoar was
demonstrated. Gastric emptying was prompt. The duodenal bulb and C
sweep were normally positioned. Barium moved into the proximal
jejunum within 10 min of ingestion of barium preparations.

Over the course of 3 hr and 35 min the barium traversed the small
bowel. The jejunum demonstrated a normal feathery mucosal pattern.
The ileum demonstrated a normal pattern as well. The terminal ileum
was observed fluoroscopically and was normal. The patient voiced no
focal tenderness to palpation over the abdomen.
IMPRESSION: 1. No residual bezoar is demonstrated. There is a small hiatal
hernia and the rugal folds of the stomach are mildly prominent.
Gastric emptying is normal.
2. The duodenum, jejunum, and ileum demonstrate no acute
abnormality. There is no evidence of obstruction.

## 2016-08-27 IMAGING — DX DG CHEST 2V
2 series · 2 of 2 positions shown · non-contrast
Comparison: 03/14/2014

CLINICAL DATA: Acute chest discomfort

EXAM:
CHEST - 2 VIEW

[chest lat]
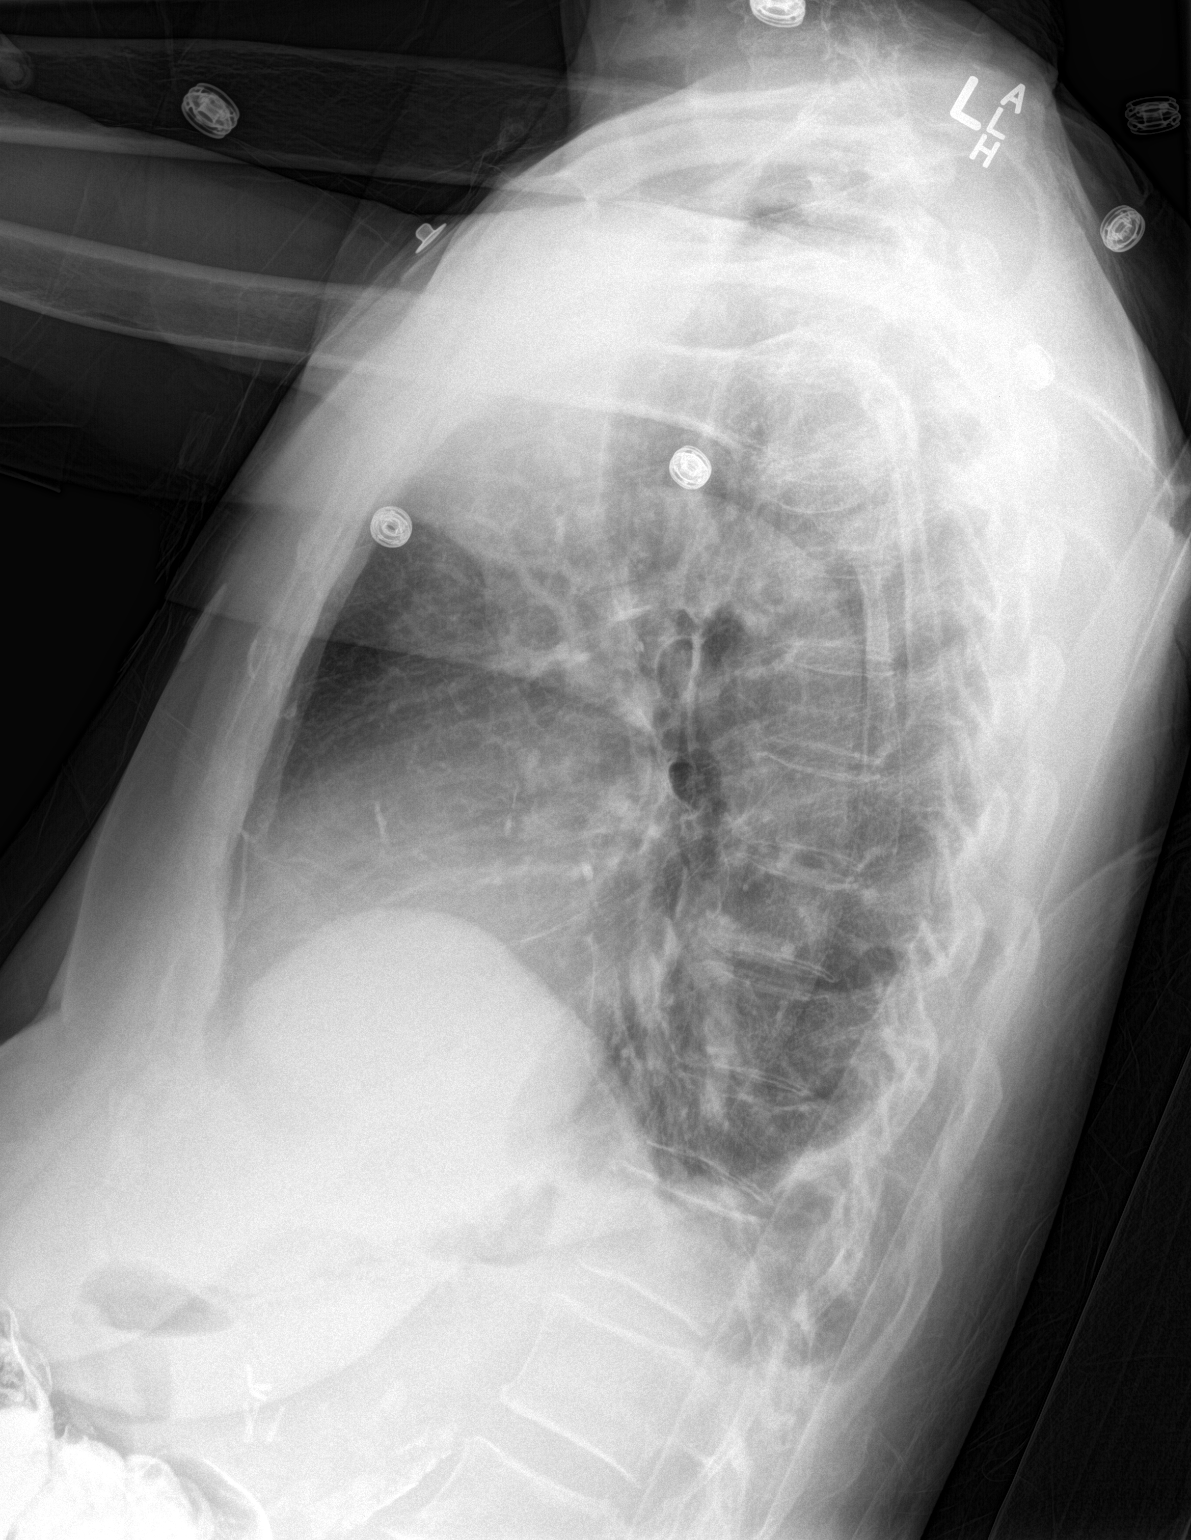

[chest ap]
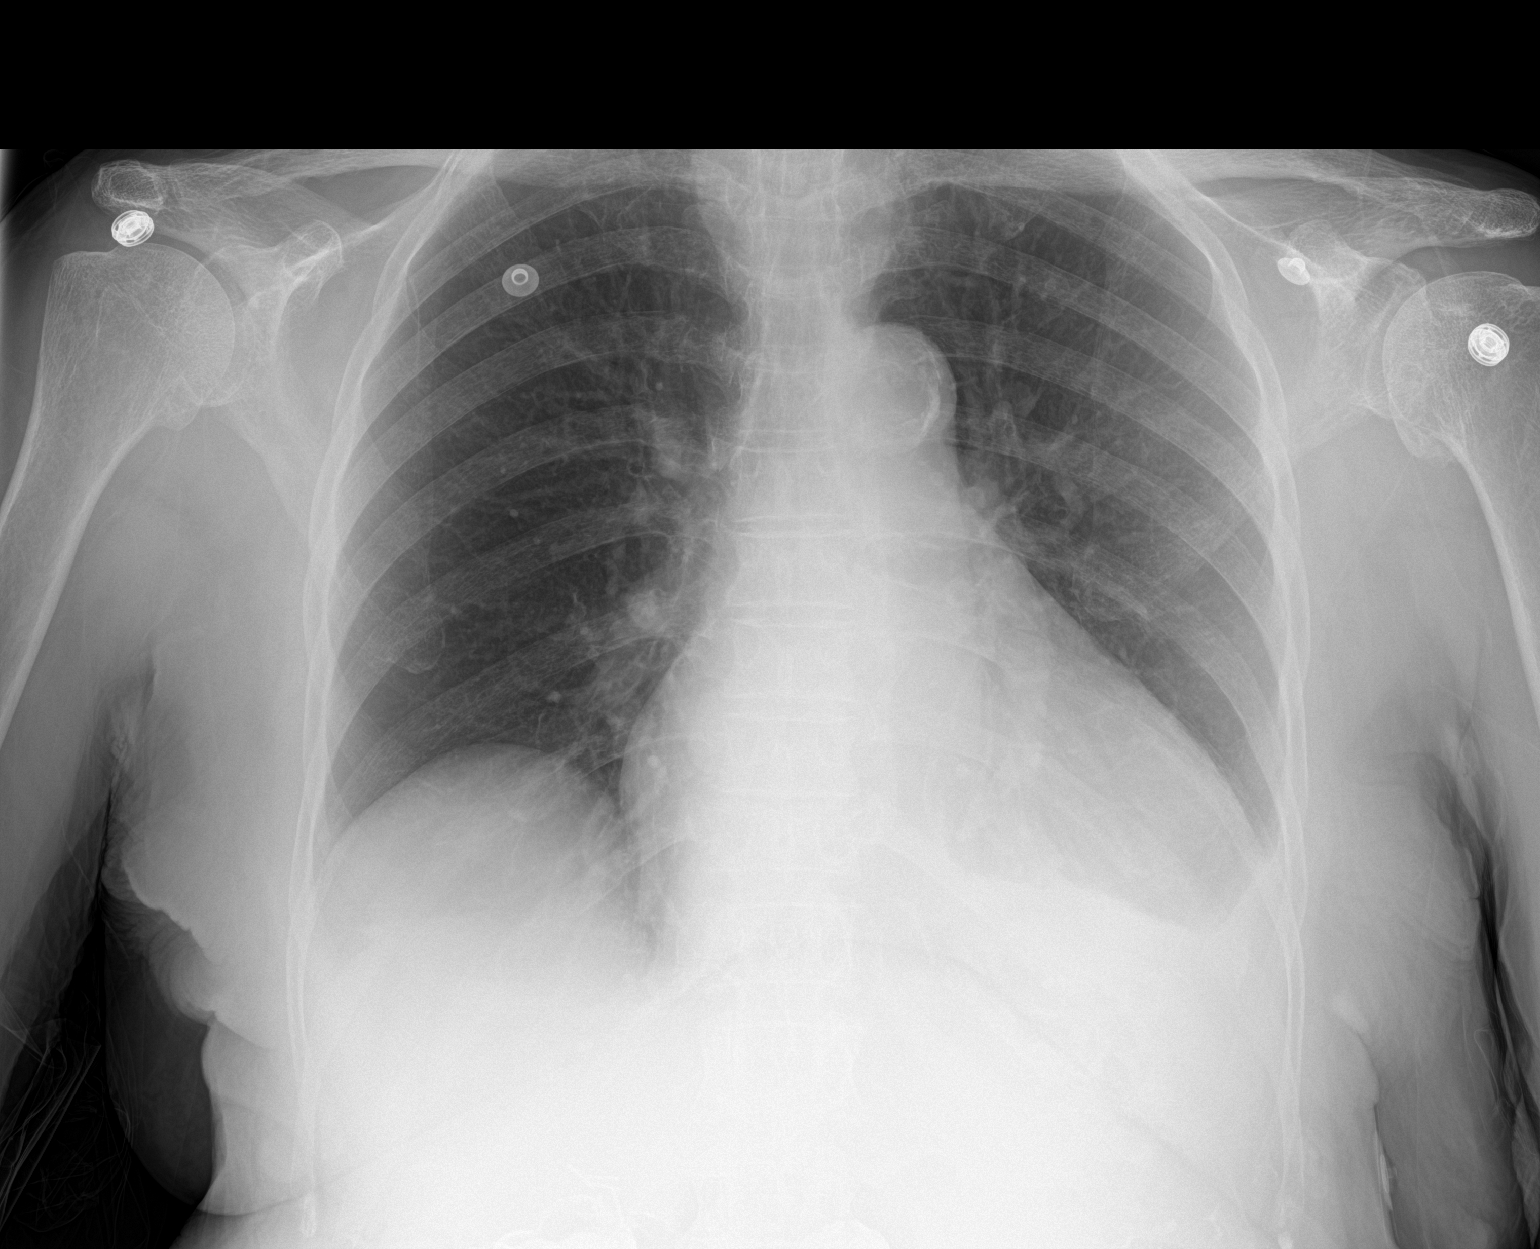

[2 of 2 positions shown; findings below may reference images not displayed]

FINDINGS: Chronic elevation of the right hemidiaphragm. Small left effusion
noted with left basilar compressive atelectasis suspected. Mild
cardiomegaly without superimposed edema or CHF. No pneumothorax.
Upper lobes remain clear. Trachea is midline. Atherosclerosis noted
of the aorta. Degenerative changes of the spine and shoulders.
IMPRESSION: Cardiomegaly without CHF

Small left effusion with left base compressive atelectasis

Chronic right hemidiaphragm elevation

## 2016-08-29 DIAGNOSIS — J441 Chronic obstructive pulmonary disease with (acute) exacerbation: Secondary | ICD-10-CM | POA: Diagnosis not present

## 2016-08-29 DIAGNOSIS — I130 Hypertensive heart and chronic kidney disease with heart failure and stage 1 through stage 4 chronic kidney disease, or unspecified chronic kidney disease: Secondary | ICD-10-CM | POA: Diagnosis not present

## 2016-08-29 DIAGNOSIS — I2510 Atherosclerotic heart disease of native coronary artery without angina pectoris: Secondary | ICD-10-CM | POA: Diagnosis not present

## 2016-08-29 DIAGNOSIS — L97523 Non-pressure chronic ulcer of other part of left foot with necrosis of muscle: Secondary | ICD-10-CM | POA: Diagnosis not present

## 2016-08-29 DIAGNOSIS — L97521 Non-pressure chronic ulcer of other part of left foot limited to breakdown of skin: Secondary | ICD-10-CM | POA: Diagnosis not present

## 2016-08-29 DIAGNOSIS — E11621 Type 2 diabetes mellitus with foot ulcer: Secondary | ICD-10-CM | POA: Diagnosis not present

## 2016-08-30 ENCOUNTER — Ambulatory Visit (INDEPENDENT_AMBULATORY_CARE_PROVIDER_SITE_OTHER): Payer: Medicare Other

## 2016-08-30 ENCOUNTER — Encounter: Payer: Self-pay | Admitting: Internal Medicine

## 2016-08-30 ENCOUNTER — Ambulatory Visit (INDEPENDENT_AMBULATORY_CARE_PROVIDER_SITE_OTHER): Payer: Medicare Other | Admitting: Internal Medicine

## 2016-08-30 VITALS — BP 118/44 | HR 63 | Temp 98.4°F | Ht 66.0 in | Wt 153.0 lb

## 2016-08-30 DIAGNOSIS — E039 Hypothyroidism, unspecified: Secondary | ICD-10-CM | POA: Diagnosis not present

## 2016-08-30 DIAGNOSIS — I480 Paroxysmal atrial fibrillation: Secondary | ICD-10-CM | POA: Diagnosis not present

## 2016-08-30 DIAGNOSIS — I82542 Chronic embolism and thrombosis of left tibial vein: Secondary | ICD-10-CM

## 2016-08-30 DIAGNOSIS — I952 Hypotension due to drugs: Secondary | ICD-10-CM | POA: Diagnosis not present

## 2016-08-30 DIAGNOSIS — I5032 Chronic diastolic (congestive) heart failure: Secondary | ICD-10-CM | POA: Diagnosis not present

## 2016-08-30 DIAGNOSIS — I10 Essential (primary) hypertension: Secondary | ICD-10-CM | POA: Diagnosis not present

## 2016-08-30 DIAGNOSIS — Z931 Gastrostomy status: Secondary | ICD-10-CM

## 2016-08-30 DIAGNOSIS — Z0000 Encounter for general adult medical examination without abnormal findings: Secondary | ICD-10-CM | POA: Diagnosis not present

## 2016-08-30 DIAGNOSIS — J449 Chronic obstructive pulmonary disease, unspecified: Secondary | ICD-10-CM

## 2016-08-30 DIAGNOSIS — E1142 Type 2 diabetes mellitus with diabetic polyneuropathy: Secondary | ICD-10-CM

## 2016-08-30 DIAGNOSIS — I96 Gangrene, not elsewhere classified: Secondary | ICD-10-CM

## 2016-08-30 DIAGNOSIS — Z23 Encounter for immunization: Secondary | ICD-10-CM

## 2016-08-30 MED ORDER — GLUCERNA 1.2 CAL PO LIQD: ORAL | 5 refills | Status: DC

## 2016-08-30 NOTE — Progress Notes (Signed)
Quick Notes   Health Maintenance: PNA 23 given.     Abnormal Screen: MMSE 23/30. Did not pass clock drawing     Patient Concerns: BP low recently. Was 15/44 yesterday, stopped Coreg yesterday     Nurse Concerns: None

## 2016-08-30 NOTE — Patient Instructions (Addendum)
Recommend follow up with Dr Meda Coffee for blood pressure, afib , and heart failure and management  STOP LOSARTAN due to low blood pressures  FOLLOW UP WITH DR Dellia Nims FOR WOUND CARE MANAGEMENT  Follow up with IR for Peg tube re-insertion due to leakage around site  Continue other medications as ordered  May use nebulizer 3 times daily for cough, wheezing and shortness of breath  Keep appt next month for routine f/u.

## 2016-08-30 NOTE — Progress Notes (Signed)
Patient ID: Shannon [name], female   DOB: [birth year], 83 y.o.   MRN: [id number]    Location:  [facility] Place of Service: OFFICE  Chief Complaint  Patient presents with  . Transitions Of Care    Hospital Follow up 08/12/2016-08/18/2016  . MMSE    23/30 Did not pass clock    HPI:  83 yo female seen today for hospital f/u (5/12-5/18th) for hypokalemia, acute COPD exacerbation, SOB, chronic dHF with possible mild exacerbation, DM, gastric stenosis/outlet obstruction s/p Peg, chronic normocytic anemia, left 2nd toe necrosis. COPD tx with IV azithromycin -->po augmentin. Cleocin stopped (taking for necrotic toe) as she was placed on augmentin. TF changed from osmolite to glucerna ("glucerna 1.2 cal at 55cc/hr from 9pm to 9am,  with Nepro 237 mL po q24h, Prostat 30 mL per tube BID") due to hyperglycemia.  Hgb 8.5-->10.3; WBC peaked 16.6K-->10.7K; Cr peaked 1.20-->0.96; K dropped 3.3-->4; glucose peaked 451; Albumin 3; AST 59-->29; TSH 0.670;  A1c 7.7% at d/c. TOC call was completed on 08/21/16.  Daughter reports DBP dropping to low 40s and she stopped losartan yesterday. Pt has had multiple medication changes in last several weeks. She is no longer on hydralazine but continues to take imdur. No longer takes bisoprolol,  amiodarone or diltiazem due to bradycardia.   She has seasonal allergy sx's and takes claritin daily and has flonase. She takes advair for asthma  No seizures since last OV. Takes gabapentin  HTN/afib/aflutter/hyperlipidemia/chronic dHF - BP controlled on hydralazine, imdur. Cholesterol stable on lipitor. Rate controlled on amiodarone and cardizem. She takes lasix. She takes coumadin for anticoagulation. INR Followed by cardio Dr Delton See  GOO - s/p GJ tube. Takes Protonix BID. Constipation stable on lactulose. She gets TF at night. Drinks liquids po. Takes zofran prn  Hyponatremia/ hx SIADH- she was taking salt tabs but over the last several weeks has noticed the entire tab in her stool  intermittently. Na 131  DM - diet controlled. A1c 7.4%. Takes gabapentin for neuropathy  Hx CVA - stable. Takes ASA daily  Depression - mood stable on sertraline  Arthritis - joint pain stable on tramadol, norco.  Hypothyroidism - takes levothyroxine 137 mcg daily. TSH 5.642  Pressure sore/right 3rd toe osteomyelitis - completed 1 month of augmentin. Followed by podiatry. She has PAD  COPD/asthma - takes advair 250/50, proventil hfa, duonebs. Followed by pulmonary. No recent exacerbations  Past Medical History:  Diagnosis Date  . Anemia    previous blood transfusions  . Arthritis    "all over"  . Asthma   . Bradycardia    requiring previous d/c of BB and reduction of amiodarone  . CAD (coronary artery disease)    nonobstructive per notes  . Chronic diastolic CHF (congestive heart failure) (HCC)   . CKD (chronic kidney disease), stage III   . Complication of blood transfusion    "got the wrong blood type at [facility] in ~ 2015; no adverse reaction that we are aware of"/daughter, Shannon [name] (01/27/2016)  . COPD (chronic obstructive pulmonary disease) (HCC)   . Depression    "light case"  . DVT (deep venous thrombosis) (HCC) 01/2016   a. LLE DVT 01/2016 - switched from Eliquis to Coumadin.  . Gastric stenosis    a. s/p stomach tube  . GERD (gastroesophageal reflux disease)   . History of blood transfusion    "several" (01/27/2016)  . History of stomach ulcers   . Hyperlipidemia   . Hypertension   . Hypothyroidism   .  Paraesophageal hernia   . Perforated gastric ulcer (HCC)   . Seasonal allergies   . SIADH (syndrome of inappropriate ADH production) (HCC)    Shannon [name] 01/10/2015  . Small bowel obstruction (HCC)    "I don't know how many" (01/11/2015)  . Stroke (HCC)    "light one"  . Type II diabetes mellitus (HCC)    "related to prednisone use  for > 20 yr; once predinose stopped; no more DM RX" (01/27/2016)  . Ventral hernia with bowel obstruction     Past Surgical  History:  Procedure Laterality Date  . CATARACT EXTRACTION W/ INTRAOCULAR LENS  IMPLANT, BILATERAL    . CHOLECYSTECTOMY OPEN    . COLECTOMY    . ESOPHAGOGASTRODUODENOSCOPY N/A 01/19/2014   Procedure: ESOPHAGOGASTRODUODENOSCOPY (EGD);  Surgeon: Hilarie Fredrickson, MD;  Location: [facility];  Service: Endoscopy;  Laterality: N/A;  . ESOPHAGOGASTRODUODENOSCOPY N/A 01/20/2014   Procedure: ESOPHAGOGASTRODUODENOSCOPY (EGD);  Surgeon: Hilarie Fredrickson, MD;  Location: [facility];  Service: Endoscopy;  Laterality: N/A;  . ESOPHAGOGASTRODUODENOSCOPY N/A 03/19/2014   Procedure: ESOPHAGOGASTRODUODENOSCOPY (EGD);  Surgeon: Rachael Fee, MD;  Location: [facility];  Service: Endoscopy;  Laterality: N/A;  . ESOPHAGOGASTRODUODENOSCOPY N/A 07/08/2015   Procedure: ESOPHAGOGASTRODUODENOSCOPY (EGD);  Surgeon: Sherrilyn Rist, MD;  Location: [facility] ENDOSCOPY;  Service: Endoscopy;  Laterality: N/A;  . ESOPHAGOGASTRODUODENOSCOPY (EGD) WITH PROPOFOL N/A 09/15/2015   Procedure: ESOPHAGOGASTRODUODENOSCOPY (EGD) WITH PROPOFOL;  Surgeon: Ruffin Frederick, MD;  Location: [facility] ENDOSCOPY;  Service: Gastroenterology;  Laterality: N/A;  . GASTROJEJUNOSTOMY     hx/notes 01/10/2015  . GASTROJEJUNOSTOMY N/A 09/23/2015   Procedure: OPEN GASTROJEJUNOSTOMY TUBE PLACEMENT;  Surgeon: De Blanch Kinsinger, MD;  Location: [facility] ORS;  Service: General;  Laterality: N/A;  . GLAUCOMA SURGERY Bilateral   . HERNIA REPAIR  2015  . IR GENERIC HISTORICAL  01/07/2016   IR GJ TUBE CHANGE 01/07/2016 Malachy Moan, MD [facility]  . IR GENERIC HISTORICAL  01/27/2016   IR MECH REMOV OBSTRUC MAT ANY COLON TUBE W/FLUORO 01/27/2016 Richarda Overlie, MD [facility]  . IR GENERIC HISTORICAL  02/07/2016   IR PATIENT EVAL TECH 0-60 MINS Darrell K Allred, PA-C [facility]  . IR GENERIC HISTORICAL  02/08/2016   IR GJ TUBE CHANGE 02/08/2016 Berdine Dance, MD [facility]  . IR GENERIC HISTORICAL  01/06/2016   IR GJ TUBE CHANGE 01/06/2016 [facility] OUT REF  . IR  GENERIC HISTORICAL  05/02/2016   IR CM INJ ANY COLONIC TUBE W/FLUORO 05/02/2016 Oley Balm, MD [facility]  . IR GENERIC HISTORICAL  05/15/2016   IR GJ TUBE CHANGE 05/15/2016 Simonne Come, MD [facility]  . IR GENERIC HISTORICAL  06/28/2016   IR GJ TUBE CHANGE 06/28/2016 [facility]  . LAPAROTOMY N/A 01/20/2015   Procedure: EXPLORATORY LAPAROTOMY;  Surgeon: Abigail Miyamoto, MD;  Location: [facility] OR;  Service: General;  Laterality: N/A;  . LYSIS OF ADHESION N/A 01/20/2015   Procedure: LYSIS OF ADHESIONS < 1 HOUR;  Surgeon: Abigail Miyamoto, MD;  Location: [facility] OR;  Service: General;  Laterality: N/A;  . TONSILLECTOMY    . TUBAL LIGATION    . VENTRAL HERNIA REPAIR  2015   incarcerated ventral hernia ([facility] 09/2013)/notes 01/10/2015    Patient Care Team: Kirt Boys, DO as PCP - General (Internal Medicine) Jason Coop, MD as Referring Physician (Cardiology) Carole Binning, MD as Referring Physician (Ophthalmology) Felecia Shelling, DPM as Consulting Physician (Podiatry)  Social History   Social History  . Marital status: Widowed  Spouse name: N/A  . Number of children: 3  . Years of education: N/A   Occupational History  . Not on file.   Social History Main Topics  . Smoking status: Never Smoker  . Smokeless tobacco: Former Neurosurgeon    Types: Snuff     Comment: "used snuff in my younger days"  . Alcohol use No  . Drug use: No  . Sexual activity: No   Other Topics Concern  . Not on file   Social History Narrative   Married in [place], lives in one story house with 2 people and no pets   Occupation: ?   Has a living Will, [place], and doesn't have DNR   Was living with Husband (in frail health) in [place] until 09/2013.  [facility] they both came to live with daughter in [place]     reports that she has never smoked. She has quit using smokeless tobacco. Her smokeless tobacco use included Snuff. She reports that she does not drink alcohol or  use drugs.  Family History  Problem Relation Age of Onset  . Stroke Mother   . Hypertension Mother   . Diabetes Brother   . Heart attack Neg Hx    Family Status  Relation Status  . Mother Deceased  . Father Deceased  . MGM Deceased  . MGF Deceased  . PGM Deceased  . PGF Deceased  . Brother (Not Specified)  . Neg Hx (Not Specified)     Allergies  Allergen Reactions  . Penicillins Itching, Rash and Other (See Comments)    Tolerated unasyn, zosyn & cephalosporins in the past.  Has patient had a PCN reaction causing immediate rash, facial/tongue/throat swelling, SOB or lightheadedness with hypotension: Yes Has patient had a PCN reaction causing severe rash involving mucus membranes or skin necrosis: Unk Has patient had a PCN reaction that required hospitalization: Unk Has patient had a PCN reaction occurring within the last 10 years: Unk If all of the above answers are "NO", then may proceed with Cephalosporins  . Other     Patient tolerates amoxicillin and cephalosporins    Medications: Patient's Medications  New Prescriptions   No medications on file  Previous Medications   ACETAMINOPHEN (TYLENOL) 500 MG TABLET    Take 1,000 mg by mouth every 6 (six) hours as needed (pain).   AMINO ACIDS-PROTEIN HYDROLYS PO    Take 30 mLs by mouth 2 (two) times daily.   ATORVASTATIN (LIPITOR) 10 MG TABLET    TAKE ONE TABLET BY MOUTH ONCE DAILY   B COMPLEX VITAMINS TABLET    Take 1 tablet by mouth daily.    CALCIUM-VITAMIN D (OSCAL WITH D) 500-200 MG-UNIT PER TABLET    Take 1 tablet by mouth daily with breakfast.    CARVEDILOL (COREG) 6.25 MG TABLET    Take 1 tablet (6.25 mg total) by mouth 2 (two) times daily.   CHLORHEXIDINE (PERIDEX) 0.12 % SOLUTION    Use as directed 15 mLs in the mouth or throat 2 (two) times daily.   FLUTICASONE (FLONASE) 50 MCG/ACT NASAL SPRAY    Place 2 sprays into both nostrils daily.   FUROSEMIDE (LASIX) 20 MG TABLET    Take 1 tablet (20 mg total) by mouth  daily.   GABAPENTIN (NEURONTIN) 300 MG CAPSULE    1 tablet by mouth three times daily, may have additional dose every 8 hours as needed pain   INSULIN ASPART (NOVOLOG FLEXPEN) 100 UNIT/ML FLEXPEN  Before each meal 3 times a day, 140-199 - 2 units, 200-250 - 4 units, 251-299 - 6 units,  300-349 - 8 units,  350 or above 10 units. Insulin PEN if approved, provide syringes and needles if needed.   IPRATROPIUM-ALBUTEROL (DUONEB) 0.5-2.5 (3) MG/3ML SOLN    Take 3 mLs by nebulization every 6 (six) hours as needed (sob). Use 3 times daily x 4 days then every 6 hours as needed.   ISOSORBIDE MONONITRATE (IMDUR) 30 MG 24 HR TABLET    Take 30 mg by mouth daily.   LATANOPROST (XALATAN) 0.005 % OPHTHALMIC SOLUTION    Place 1 drop into both eyes at bedtime.   LEVALBUTEROL (XOPENEX HFA) 45 MCG/ACT INHALER    Inhale 2 puffs into the lungs every 6 (six) hours as needed for wheezing or shortness of breath.   LEVOTHYROXINE (SYNTHROID, LEVOTHROID) 137 MCG TABLET    TAKE 1 TABLET (137 MCG TOTAL) BY MOUTH DAILY BEFORE BREAKFAST.   LOSARTAN (COZAAR) 50 MG TABLET    Take 50 mg by mouth daily.   MALTODEXTRIN-XANTHAN GUM (RESOURCE THICKENUP CLEAR) POWD    Take 120 g by mouth as needed (use to thicken liquids.).   MULTIPLE VITAMIN (MULTIVITAMIN WITH MINERALS) TABS TABLET    Take 1 tablet by mouth daily.   NUTRITIONAL SUPPLEMENTS (FEEDING SUPPLEMENT, OSMOLITE 1.5 CAL,) LIQD    Place 1,000 mLs into feeding tube Nightly.   POLYETHYL GLYCOL-PROPYL GLYCOL 0.4-0.3 % SOLN    Place 1 drop into both eyes 4 (four) times daily.    POLYETHYLENE GLYCOL (MIRALAX / GLYCOLAX) PACKET    Take 17 g by mouth daily as needed for mild constipation.   RESTASIS 0.05 % OPHTHALMIC EMULSION    USE 1 DROP INTO BOTH EYES TWICE DAILY   SERTRALINE (ZOLOFT) 100 MG TABLET    TAKE ONE TABLET BY MOUTH ONCE DAILY FOR DEPRESSION   WARFARIN (COUMADIN) 2.5 MG TABLET    TAKE AS DIRECTED BY COUMADIN CLINIC.   WATER FOR IRRIGATION, STERILE (FREE WATER) SOLN     Place 230 mLs into feeding tube 3 (three) times daily.  Modified Medications   No medications on file  Discontinued Medications   No medications on file    Review of Systems  Constitutional: Positive for fatigue.  HENT: Positive for trouble swallowing.   Gastrointestinal: Positive for abdominal distention.  Musculoskeletal: Positive for arthralgias.  Skin: Positive for wound.  All other systems reviewed and are negative.   Vitals:   08/30/16 1438  BP: (!) 118/44  Pulse: 63  Temp: 98.4 F (36.9 C)  TempSrc: Oral  SpO2: 97%  Weight: 153 lb (69.4 kg)  Height: 5\' 6"  (1.676 m)   Body mass index is 24.69 kg/m.  Physical Exam  Constitutional: She is oriented to person, place, and time. She appears well-developed.  Frail appearing, sitting upright in chair, min conversational dyspnea. Daughter present  HENT:  Mouth/Throat: Oropharynx is clear and moist. No oropharyngeal exudate.  MMM  Eyes: Pupils are equal, round, and reactive to light. No scleral icterus.  Neck: Neck supple. Carotid bruit is not present. No tracheal deviation present. No thyromegaly present.  Cardiovascular: Normal rate, regular rhythm and intact distal pulses.  Exam reveals no gallop and no friction rub.   Murmur (1/6 SEM) heard. +1 pitting LLE edema with calf TTP and palpable cord; trace RLE with No calf TTP; loss of hair on leg b/l due to swelling. Left calf varicose veins, soft, NT  Pulmonary/Chest: Effort normal. No stridor.  No respiratory distress. She has no wheezes. She has no rales.  Abdominal: Soft. Bowel sounds are normal. She exhibits no distension and no mass. There is no hepatomegaly. There is tenderness (epigastric). There is no rebound and no guarding.  GJ tube intact with no signs of redness or d/c at insertion site; NT. Leakage around site noted  Musculoskeletal: She exhibits edema.  Lymphadenopathy:    She has no cervical adenopathy.  Neurological: She is alert and oriented to person,  place, and time. Gait (uses rolling walker) abnormal.  Skin: Skin is warm and dry. No rash noted.  Psychiatric: She has a normal mood and affect. Her behavior is normal. Thought content normal.     Labs reviewed: Admission on 08/12/2016, Discharged on 08/18/2016  No results displayed because visit has over 200 results.    Anti-coag visit on 08/11/2016  Component Date Value Ref Range Status  . INR 08/11/2016 2.4   Final  Office Visit on 08/07/2016  Component Date Value Ref Range Status  . WBC 08/07/2016 8.3  3.8 - 10.8 K/uL Final  . RBC 08/07/2016 3.02* 3.80 - 5.10 MIL/uL Final  . Hemoglobin 08/07/2016 8.6* 11.7 - 15.5 g/dL Final  . HCT 16/01/9603 27.3* 35.0 - 45.0 % Final  . MCV 08/07/2016 90.4  80.0 - 100.0 fL Final  . MCH 08/07/2016 28.5  27.0 - 33.0 pg Final  . MCHC 08/07/2016 31.5* 32.0 - 36.0 g/dL Final  . RDW 54/12/8117 17.8* 11.0 - 15.0 % Final  . Platelets 08/07/2016 495* 140 - 400 K/uL Final  . MPV 08/07/2016 9.6  7.5 - 12.5 fL Final  . Neutro Abs 08/07/2016 4648  1,500 - 7,800 cells/uL Final  . Lymphs Abs 08/07/2016 1577  850 - 3,900 cells/uL Final  . Monocytes Absolute 08/07/2016 1079* 200 - 950 cells/uL Final  . Eosinophils Absolute 08/07/2016 913* 15 - 500 cells/uL Final  . Basophils Absolute 08/07/2016 83  0 - 200 cells/uL Final  . Neutrophils Relative % 08/07/2016 56  % Final  . Lymphocytes Relative 08/07/2016 19  % Final  . Monocytes Relative 08/07/2016 13  % Final  . Eosinophils Relative 08/07/2016 11  % Final  . Basophils Relative 08/07/2016 1  % Final  . Smear Review 08/07/2016 Criteria for review not met   Final  . Sodium 08/07/2016 133* 135 - 146 mmol/L Final  . Potassium 08/07/2016 5.3  3.5 - 5.3 mmol/L Final  . Chloride 08/07/2016 100  98 - 110 mmol/L Final  . CO2 08/07/2016 25  20 - 31 mmol/L Final  . Glucose, Bld 08/07/2016 170* 65 - 99 mg/dL Final  . BUN 14/78/2956 58* 7 - 25 mg/dL Final  . Creat 21/30/8657 1.20* 0.60 - 0.88 mg/dL Final   Comment:    For patients > or = 81 years of age: The upper reference limit for Creatinine is approximately 13% higher for people identified as African-American.     . Calcium 08/07/2016 9.2  8.6 - 10.4 mg/dL Final  . GFR, Est African American 08/07/2016 46* >=60 mL/min Final  . GFR, Est Non African American 08/07/2016 40* >=60 mL/min Final  Anti-coag visit on 08/02/2016  Component Date Value Ref Range Status  . INR 08/02/2016 3.0   Final   [facility]  Admission on 07/28/2016, Discharged on 08/01/2016  Component Date Value Ref Range Status  . WBC 07/28/2016 9.1  4.0 - 10.5 K/uL Final  . RBC 07/28/2016 3.04* 3.87 - 5.11 MIL/uL Final  . Hemoglobin 07/28/2016 8.9* 12.0 -  15.0 g/dL Final  . HCT 16/01/9603 26.0* 36.0 - 46.0 % Final  . MCV 07/28/2016 85.5  78.0 - 100.0 fL Final  . MCH 07/28/2016 29.3  26.0 - 34.0 pg Final  . MCHC 07/28/2016 34.2  30.0 - 36.0 g/dL Final  . RDW 54/12/8117 18.0* 11.5 - 15.5 % Final  . Platelets 07/28/2016 530* 150 - 400 K/uL Final  . Neutrophils Relative % 07/28/2016 71  % Final  . Neutro Abs 07/28/2016 6.4  1.7 - 7.7 K/uL Final  . Lymphocytes Relative 07/28/2016 19  % Final  . Lymphs Abs 07/28/2016 1.7  0.7 - 4.0 K/uL Final  . Monocytes Relative 07/28/2016 7  % Final  . Monocytes Absolute 07/28/2016 0.6  0.1 - 1.0 K/uL Final  . Eosinophils Relative 07/28/2016 3  % Final  . Eosinophils Absolute 07/28/2016 0.3  0.0 - 0.7 K/uL Final  . Basophils Relative 07/28/2016 0  % Final  . Basophils Absolute 07/28/2016 0.0  0.0 - 0.1 K/uL Final  . Sodium 07/28/2016 136  135 - 145 mmol/L Final  . Potassium 07/28/2016 4.2  3.5 - 5.1 mmol/L Final  . Chloride 07/28/2016 102  101 - 111 mmol/L Final  . BUN 07/28/2016 43* 6 - 20 mg/dL Final  . Creatinine, Ser 07/28/2016 1.30* 0.44 - 1.00 mg/dL Final  . Glucose, Bld 14/78/2956 101* 65 - 99 mg/dL Final  . Calcium, Ion 21/30/8657 1.14* 1.15 - 1.40 mmol/L Final  . TCO2 07/28/2016 24  0 - 100 mmol/L Final  . Hemoglobin 07/28/2016 9.5* 12.0 -  15.0 g/dL Final  . HCT 84/69/6295 28.0* 36.0 - 46.0 % Final  . Prothrombin Time 07/28/2016 28.8* 11.4 - 15.2 seconds Final  . INR 07/28/2016 2.65   Final  . Sed Rate 07/28/2016 >140* 0 - 22 mm/hr Final  . CRP 07/28/2016 12.8* <1.0 mg/dL Final  . Specimen Description 07/29/2016 BLOOD LEFT HAND   Final  . Special Requests 07/29/2016 BOTTLES DRAWN AEROBIC ONLY Blood Culture adequate volume   Final  . Culture 07/29/2016 NO GROWTH 5 DAYS   Final  . Report Status 07/29/2016 08/03/2016 FINAL   Final  . Specimen Description 07/29/2016 BLOOD RIGHT HAND   Final  . Special Requests 07/29/2016 BOTTLES DRAWN AEROBIC ONLY Blood Culture adequate volume   Final  . Culture 07/29/2016 NO GROWTH 5 DAYS   Final  . Report Status 07/29/2016 08/03/2016 FINAL   Final  . Prealbumin 07/29/2016 16.8* 18 - 38 mg/dL Final  . Prothrombin Time 07/29/2016 27.4* 11.4 - 15.2 seconds Final  . INR 07/29/2016 2.49   Final  . WBC 07/29/2016 11.5* 4.0 - 10.5 K/uL Final  . RBC 07/29/2016 2.72* 3.87 - 5.11 MIL/uL Final  . Hemoglobin 07/29/2016 7.9* 12.0 - 15.0 g/dL Final  . HCT 28/41/3244 23.4* 36.0 - 46.0 % Final  . MCV 07/29/2016 86.0  78.0 - 100.0 fL Final  . MCH 07/29/2016 29.0  26.0 - 34.0 pg Final  . MCHC 07/29/2016 33.8  30.0 - 36.0 g/dL Final  . RDW 04/05/7251 17.6* 11.5 - 15.5 % Final  . Platelets 07/29/2016 591* 150 - 400 K/uL Final  . Sodium 07/29/2016 135  135 - 145 mmol/L Final  . Potassium 07/29/2016 4.5  3.5 - 5.1 mmol/L Final  . Chloride 07/29/2016 102  101 - 111 mmol/L Final  . CO2 07/29/2016 23  22 - 32 mmol/L Final  . Glucose, Bld 07/29/2016 89  65 - 99 mg/dL Final  . BUN 66/44/0347 42* 6 - 20  mg/dL Final  . Creatinine, Ser 07/29/2016 1.15* 0.44 - 1.00 mg/dL Final  . Calcium 08/65/7846 9.8  8.9 - 10.3 mg/dL Final  . GFR calc non Af Amer 07/29/2016 40* >60 mL/min Final  . GFR calc Af Amer 07/29/2016 47* >60 mL/min Final   Comment: (NOTE) The eGFR has been calculated using the CKD EPI equation. This  calculation has not been validated in all clinical situations. eGFR's persistently <60 mL/min signify possible Chronic Kidney Disease.   . Anion gap 07/29/2016 10  5 - 15 Final  . Glucose-Capillary 07/29/2016 96  65 - 99 mg/dL Final  . MRSA by PCR 96/29/5284 NEGATIVE  NEGATIVE Final   Comment:        The GeneXpert MRSA Assay (FDA approved for NASAL specimens only), is one component of a comprehensive MRSA colonization surveillance program. It is not intended to diagnose MRSA infection nor to guide or monitor treatment for MRSA infections.   . Glucose-Capillary 07/29/2016 116* 65 - 99 mg/dL Final  . Glucose-Capillary 07/29/2016 168* 65 - 99 mg/dL Final  . Glucose-Capillary 07/29/2016 143* 65 - 99 mg/dL Final  . Comment 1 13/24/4010 Notify RN   Final  . Comment 2 07/29/2016 Document in Chart   Final  . Prothrombin Time 07/30/2016 25.5* 11.4 - 15.2 seconds Final  . INR 07/30/2016 2.28   Final  . Glucose-Capillary 07/29/2016 130* 65 - 99 mg/dL Final  . Glucose-Capillary 07/29/2016 177* 65 - 99 mg/dL Final  . Glucose-Capillary 07/30/2016 167* 65 - 99 mg/dL Final  . Glucose-Capillary 07/30/2016 170* 65 - 99 mg/dL Final  . Glucose-Capillary 07/30/2016 135* 65 - 99 mg/dL Final  . Glucose-Capillary 07/30/2016 131* 65 - 99 mg/dL Final  . Prothrombin Time 07/31/2016 24.4* 11.4 - 15.2 seconds Final  . INR 07/31/2016 2.16   Final  . WBC 07/31/2016 8.6  4.0 - 10.5 K/uL Final  . RBC 07/31/2016 3.12* 3.87 - 5.11 MIL/uL Final  . Hemoglobin 07/31/2016 8.9* 12.0 - 15.0 g/dL Final  . HCT 27/25/3664 26.9* 36.0 - 46.0 % Final  . MCV 07/31/2016 86.2  78.0 - 100.0 fL Final  . MCH 07/31/2016 28.5  26.0 - 34.0 pg Final  . MCHC 07/31/2016 33.1  30.0 - 36.0 g/dL Final  . RDW 40/34/7425 18.2* 11.5 - 15.5 % Final  . Platelets 07/31/2016 526* 150 - 400 K/uL Final  . Glucose-Capillary 07/30/2016 109* 65 - 99 mg/dL Final  . Glucose-Capillary 07/31/2016 153* 65 - 99 mg/dL Final  . Glucose-Capillary  07/31/2016 137* 65 - 99 mg/dL Final  . Glucose-Capillary 07/31/2016 138* 65 - 99 mg/dL Final  . Prothrombin Time 08/01/2016 24.1* 11.4 - 15.2 seconds Final  . INR 08/01/2016 2.13   Final  . WBC 08/01/2016 8.6  4.0 - 10.5 K/uL Final  . RBC 08/01/2016 3.55* 3.87 - 5.11 MIL/uL Final  . Hemoglobin 08/01/2016 10.2* 12.0 - 15.0 g/dL Final  . HCT 95/63/8756 30.7* 36.0 - 46.0 % Final  . MCV 08/01/2016 86.5  78.0 - 100.0 fL Final  . MCH 08/01/2016 28.7  26.0 - 34.0 pg Final  . MCHC 08/01/2016 33.2  30.0 - 36.0 g/dL Final  . RDW 43/32/9518 18.0* 11.5 - 15.5 % Final  . Platelets 08/01/2016 577* 150 - 400 K/uL Final  . Sodium 08/01/2016 131* 135 - 145 mmol/L Final  . Potassium 08/01/2016 4.7  3.5 - 5.1 mmol/L Final  . Chloride 08/01/2016 100* 101 - 111 mmol/L Final  . CO2 08/01/2016 24  22 - 32 mmol/L  Final  . Glucose, Bld 08/01/2016 153* 65 - 99 mg/dL Final  . BUN 40/98/1191 29* 6 - 20 mg/dL Final  . Creatinine, Ser 08/01/2016 1.01* 0.44 - 1.00 mg/dL Final  . Calcium 47/82/9562 9.3  8.9 - 10.3 mg/dL Final  . GFR calc non Af Amer 08/01/2016 47* >60 mL/min Final  . GFR calc Af Amer 08/01/2016 55* >60 mL/min Final   Comment: (NOTE) The eGFR has been calculated using the CKD EPI equation. This calculation has not been validated in all clinical situations. eGFR's persistently <60 mL/min signify possible Chronic Kidney Disease.   . Anion gap 08/01/2016 7  5 - 15 Final  . Glucose-Capillary 07/31/2016 197* 65 - 99 mg/dL Final  . Comment 1 13/11/6576 Notify RN   Final  . Glucose-Capillary 08/01/2016 172* 65 - 99 mg/dL Final  . Comment 1 46/96/2952 Notify RN   Final  . Glucose-Capillary 08/01/2016 136* 65 - 99 mg/dL Final  . Glucose-Capillary 08/01/2016 181* 65 - 99 mg/dL Final  . Glucose-Capillary 08/01/2016 172* 65 - 99 mg/dL Final  Anti-coag visit on 07/26/2016  Component Date Value Ref Range Status  . INR 07/26/2016 2.7   Final   [facility]  Appointment on 07/21/2016  Component Date Value Ref Range  Status  . TSH 07/21/2016 4.97* mIU/L Final   Comment:   Reference Range   > or = 20 Years  0.40-4.50   Pregnancy Range First trimester  0.26-2.66 Second trimester 0.55-2.73 Third trimester  0.43-2.91     Admission on 07/18/2016, Discharged on 07/18/2016  Component Date Value Ref Range Status  . Prothrombin Time 07/18/2016 25.6* 11.4 - 15.2 seconds Final  . INR 07/18/2016 2.29   Final  . WBC 07/18/2016 14.7* 4.0 - 10.5 K/uL Final  . RBC 07/18/2016 3.18* 3.87 - 5.11 MIL/uL Final  . Hemoglobin 07/18/2016 9.4* 12.0 - 15.0 g/dL Final  . HCT 84/13/2440 27.3* 36.0 - 46.0 % Final  . MCV 07/18/2016 85.8  78.0 - 100.0 fL Final  . MCH 07/18/2016 29.6  26.0 - 34.0 pg Final  . MCHC 07/18/2016 34.4  30.0 - 36.0 g/dL Final  . RDW 01/28/2535 19.1* 11.5 - 15.5 % Final  . Platelets 07/18/2016 316  150 - 400 K/uL Final  . Neutrophils Relative % 07/18/2016 75  % Final  . Lymphocytes Relative 07/18/2016 14  % Final  . Monocytes Relative 07/18/2016 9  % Final  . Eosinophils Relative 07/18/2016 2  % Final  . Basophils Relative 07/18/2016 0  % Final  . Neutro Abs 07/18/2016 11.0* 1.7 - 7.7 K/uL Final  . Lymphs Abs 07/18/2016 2.1  0.7 - 4.0 K/uL Final  . Monocytes Absolute 07/18/2016 1.3* 0.1 - 1.0 K/uL Final  . Eosinophils Absolute 07/18/2016 0.3  0.0 - 0.7 K/uL Final  . Basophils Absolute 07/18/2016 0.0  0.0 - 0.1 K/uL Final  . Smear Review 07/18/2016 MORPHOLOGY UNREMARKABLE   Final  . Sodium 07/18/2016 134* 135 - 145 mmol/L Final  . Potassium 07/18/2016 4.6  3.5 - 5.1 mmol/L Final  . Chloride 07/18/2016 96* 101 - 111 mmol/L Final  . CO2 07/18/2016 26  22 - 32 mmol/L Final  . Glucose, Bld 07/18/2016 111* 65 - 99 mg/dL Final  . BUN 64/40/3474 36* 6 - 20 mg/dL Final  . Creatinine, Ser 07/18/2016 1.31* 0.44 - 1.00 mg/dL Final  . Calcium 25/95/6387 9.7  8.9 - 10.3 mg/dL Final  . GFR calc non Af Amer 07/18/2016 34* >60 mL/min Final  . GFR calc  Af Amer 07/18/2016 40* >60 mL/min Final   Comment:  (NOTE) The eGFR has been calculated using the CKD EPI equation. This calculation has not been validated in all clinical situations. eGFR's persistently <60 mL/min signify possible Chronic Kidney Disease.   . Anion gap 07/18/2016 12  5 - 15 Final  Anti-coag visit on 07/11/2016  Component Date Value Ref Range Status  . INR 07/11/2016 3.9   Final   [facility]  Anti-coag visit on 07/05/2016  Component Date Value Ref Range Status  . INR 07/05/2016 2.6   Final   [facility] nurse  There may be more visits with results that are not included.    Dg Chest 2 View  Result Date: 08/12/2016 CLINICAL DATA:  Shortness of breath and cough today. EXAM: CHEST  2 VIEW COMPARISON:  06/18/2016 and prior chest radiograph FINDINGS: Cardiomegaly and mild pulmonary vascular congestion noted. Elevation of the right hemidiaphragm is again identified. There is no evidence of focal airspace disease, pulmonary edema, suspicious pulmonary nodule/mass, pleural effusion, or pneumothorax. No acute bony abnormalities are identified. IMPRESSION: Cardiomegaly with mild pulmonary vascular congestion. Electronically Signed   By: Harmon Pier M.D.   On: 08/12/2016 14:57   Dg Chest Port 1 View  Result Date: 08/16/2016 CLINICAL DATA:  Shortness of breath. EXAM: PORTABLE CHEST 1 VIEW COMPARISON:  08/14/2016. FINDINGS: Mediastinum hilar structures are normal. Stable cardiomegaly. Normal pulmonary vascularity. Low lung volumes, interval improvement of aeration of left lung base. No infiltrate noted. No pleural effusion or pneumothorax. Thoracic spine scoliosis and degenerative change IMPRESSION: 1. Low lung volumes with interim improvement of aeration of left lung base. No infiltrate noted. 2. Stable cardiomegaly.  No pulmonary venous congestion. Electronically Signed   By: Maisie Fus  Register   On: 08/16/2016 06:54   Dg Chest Port 1 View  Result Date: 08/14/2016 CLINICAL DATA:  Shortness of breath.  Cough. EXAM: PORTABLE CHEST 1 VIEW  COMPARISON:  08/12/2016. FINDINGS: Mediastinum hilar structures are normal. Cardiomegaly. Low lung volumes with bibasilar atelectasis. Left base infiltrate cannot be excluded. Tiny left pleural effusion cannot be excluded. No pneumothorax. IMPRESSION: 1. Low lung volumes with bibasilar atelectasis. Left base infiltrate cannot be excluded. Small left pleural effusion cannot be excluded. 2. Stable cardiomegaly. Electronically Signed   By: Maisie Fus  Register   On: 08/14/2016 06:41     Assessment/Plan   ICD-9-CM ICD-10-CM   1. Hypotension due to drugs 458.8 I95.2    E947.9    2. Essential hypertension, benign 401.1 I10   3. PEG (percutaneous endoscopic gastrostomy) status (HCC) V44.1 Z93.1   4. COPD with asthma (HCC) 493.20 J44.9   5. Paroxysmal atrial fibrillation (HCC) 427.31 I48.0   6. Chronic diastolic CHF (congestive heart failure) (HCC) 428.32 I50.32    428.0    7. Diabetic polyneuropathy associated with type 2 diabetes mellitus (HCC) 250.60 E11.42    357.2    8. Hypothyroidism, unspecified type 244.9 E03.9   9. Gangrene of toe of left foot (HCC) 785.4 I96   10. Chronic deep vein thrombosis (DVT) of tibial vein of left lower extremity (HCC) 453.52 I82.542    Recommend follow up with Dr Delton See for blood pressure, afib , and heart failure and management  STOP LOSARTAN due to low blood pressures  FOLLOW UP WITH DR Leanord Hawking FOR WOUND CARE MANAGEMENT  Follow up with IR for Peg tube re-insertion due to leakage around site  Continue other medications as ordered  May use nebulizer 3 times daily for cough, wheezing and shortness  of breath  Keep appt next month for routine f/u.  Kymari Lollis S. Ancil Linsey  [facility] [address] [place], [place] [address] [phone] Cell (Monday-Friday 8 AM - 5 PM) [phone] After 5 PM and follow prompts

## 2016-08-30 NOTE — Patient Instructions (Signed)
Shannon [name] , Thank you for taking time to come for your Medicare Wellness Visit. I appreciate your ongoing commitment to your health goals. Please review the following plan we discussed and let me know if I can assist you in the future.   Screening recommendations/referrals: Colonoscopy up to date Mammogram up to date Bone Density up to date Recommended yearly ophthalmology/optometry visit for glaucoma screening and checkup Recommended yearly dental visit for hygiene and checkup  Vaccinations: Influenza vaccine due 12/28/2016 Pneumococcal vaccine up to date. PNA 23 given Tdap vaccine up to date. Due 07/19/2026 Shingles vaccine up to date. If you want new vaccine we will put in prescription  Advanced directives: In chart  Conditions/risks identified: Low BP  Next appointment: Eulas Post 08/30/16 @ 2:45pm   Preventive Care 65 Years and Older, Female Preventive care refers to lifestyle choices and visits with your health care provider that can promote health and wellness. What does preventive care include?  A yearly physical exam. This is also called an annual well check.  Dental exams once or twice a year.  Routine eye exams. Ask your health care provider how often you should have your eyes checked.  Personal lifestyle choices, including:  Daily care of your teeth and gums.  Regular physical activity.  Eating a healthy diet.  Avoiding tobacco and drug use.  Limiting alcohol use.  Practicing safe sex.  Taking low-dose aspirin every day.  Taking vitamin and mineral supplements as recommended by your health care provider. What happens during an annual well check? The services and screenings done by your health care provider during your annual well check will depend on your age, overall health, lifestyle risk factors, and family history of disease. Counseling  Your health care provider may ask you questions about your:  Alcohol use.  Tobacco use.  Drug use.  Emotional  well-being.  Home and relationship well-being.  Sexual activity.  Eating habits.  History of falls.  Memory and ability to understand (cognition).  Work and work Statistician.  Reproductive health. Screening  You may have the following tests or measurements:  Height, weight, and BMI.  Blood pressure.  Lipid and cholesterol levels. These may be checked every 5 years, or more frequently if you are over 21 years old.  Skin check.  Lung cancer screening. You may have this screening every year starting at age 36 if you have a 30-pack-year history of smoking and currently smoke or have quit within the past 15 years.  Fecal occult blood test (FOBT) of the stool. You may have this test every year starting at age 87.  Flexible sigmoidoscopy or colonoscopy. You may have a sigmoidoscopy every 5 years or a colonoscopy every 10 years starting at age 4.  Hepatitis C blood test.  Hepatitis B blood test.  Sexually transmitted disease (STD) testing.  Diabetes screening. This is done by checking your blood sugar (glucose) after you have not eaten for a while (fasting). You may have this done every 1-3 years.  Bone density scan. This is done to screen for osteoporosis. You may have this done starting at age 58.  Mammogram. This may be done every 1-2 years. Talk to your health care provider about how often you should have regular mammograms. Talk with your health care provider about your test results, treatment options, and if necessary, the need for more tests. Vaccines  Your health care provider may recommend certain vaccines, such as:  Influenza vaccine. This is recommended every year.  Tetanus, diphtheria, and  acellular pertussis (Tdap, Td) vaccine. You may need a Td booster every 10 years.  Zoster vaccine. You may need this after age 39.  Pneumococcal 13-valent conjugate (PCV13) vaccine. One dose is recommended after age 19.  Pneumococcal polysaccharide (PPSV23) vaccine. One  dose is recommended after age 61. Talk to your health care provider about which screenings and vaccines you need and how often you need them. This information is not intended to replace advice given to you by your health care provider. Make sure you discuss any questions you have with your health care provider. Document Released: 04/16/2015 Document Revised: 12/08/2015 Document Reviewed: 01/19/2015 Elsevier Interactive Patient Education  2017 [employer] Prevention in the Home Falls can cause injuries. They can happen to people of all ages. There are many things you can do to make your home safe and to help prevent falls. What can I do on the outside of my home?  Regularly fix the edges of walkways and driveways and fix any cracks.  Remove anything that might make you trip as you walk through a door, such as a raised step or threshold.  Trim any bushes or trees on the path to your home.  Use bright outdoor lighting.  Clear any walking paths of anything that might make someone trip, such as rocks or tools.  Regularly check to see if handrails are loose or broken. Make sure that both sides of any steps have handrails.  Any raised decks and porches should have guardrails on the edges.  Have any leaves, snow, or ice cleared regularly.  Use sand or salt on walking paths during winter.  Clean up any spills in your garage right away. This includes oil or grease spills. What can I do in the bathroom?  Use night lights.  Install grab bars by the toilet and in the tub and shower. Do not use towel bars as grab bars.  Use non-skid mats or decals in the tub or shower.  If you need to sit down in the shower, use a plastic, non-slip stool.  Keep the floor dry. Clean up any water that spills on the floor as soon as it happens.  Remove soap buildup in the tub or shower regularly.  Attach bath mats securely with double-sided non-slip rug tape.  Do not have throw rugs and other  things on the floor that can make you trip. What can I do in the bedroom?  Use night lights.  Make sure that you have a light by your bed that is easy to reach.  Do not use any sheets or blankets that are too big for your bed. They should not hang down onto the floor.  Have a firm chair that has side arms. You can use this for support while you get dressed.  Do not have throw rugs and other things on the floor that can make you trip. What can I do in the kitchen?  Clean up any spills right away.  Avoid walking on wet floors.  Keep items that you use a lot in easy-to-reach places.  If you need to reach something above you, use a strong step stool that has a grab bar.  Keep electrical cords out of the way.  Do not use floor polish or wax that makes floors slippery. If you must use wax, use non-skid floor wax.  Do not have throw rugs and other things on the floor that can make you trip. What can I do with my stairs?  Do not leave any items on the stairs.  Make sure that there are handrails on both sides of the stairs and use them. Fix handrails that are broken or loose. Make sure that handrails are as long as the stairways.  Check any carpeting to make sure that it is firmly attached to the stairs. Fix any carpet that is loose or worn.  Avoid having throw rugs at the top or bottom of the stairs. If you do have throw rugs, attach them to the floor with carpet tape.  Make sure that you have a light switch at the top of the stairs and the bottom of the stairs. If you do not have them, ask someone to add them for you. What else can I do to help prevent falls?  Wear shoes that:  Do not have high heels.  Have rubber bottoms.  Are comfortable and fit you well.  Are closed at the toe. Do not wear sandals.  If you use a stepladder:  Make sure that it is fully opened. Do not climb a closed stepladder.  Make sure that both sides of the stepladder are locked into place.  Ask  someone to hold it for you, if possible.  Clearly mark and make sure that you can see:  Any grab bars or handrails.  First and last steps.  Where the edge of each step is.  Use tools that help you move around (mobility aids) if they are needed. These include:  Canes.  Walkers.  Scooters.  Crutches.  Turn on the lights when you go into a dark area. Replace any light bulbs as soon as they burn out.  Set up your furniture so you have a clear path. Avoid moving your furniture around.  If any of your floors are uneven, fix them.  If there are any pets around you, be aware of where they are.  Review your medicines with your doctor. Some medicines can make you feel dizzy. This can increase your chance of falling. Ask your doctor what other things that you can do to help prevent falls. This information is not intended to replace advice given to you by your health care provider. Make sure you discuss any questions you have with your health care provider. Document Released: 01/14/2009 Document Revised: 08/26/2015 Document Reviewed: 04/24/2014 Elsevier Interactive Patient Education  2017 [employer].

## 2016-08-30 NOTE — Progress Notes (Addendum)
Subjective:   Shannon [name] is a 83 y.o. female who presents for Medicare Annual (Subsequent) preventive examination.     Objective:     Vitals: BP (!) 118/44 (BP Location: Left Arm, Patient Position: Sitting)   Pulse 63   Temp 98.4 F (36.9 C) (Oral)   Ht 5\' 6"  (1.676 m)   Wt 153 lb (69.4 kg)   SpO2 97%   BMI 24.69 kg/m   Body mass index is 24.69 kg/m.   Tobacco History  Smoking Status  . Never Smoker  Smokeless Tobacco  . Former Systems developer  . Types: Snuff    Comment: "used snuff in my younger days"     Counseling given: Not Answered   Past Medical History:  Diagnosis Date  . Anemia    previous blood transfusions  . Arthritis    "all over"  . Asthma   . Bradycardia    requiring previous d/c of BB and reduction of amiodarone  . CAD (coronary artery disease)    nonobstructive per notes  . Chronic diastolic CHF (congestive heart failure) ([place])   . CKD (chronic kidney disease), stage III   . Complication of blood transfusion    "got the wrong blood type at [facility] in ~ 2015; no adverse reaction that we are aware of"/daughter, [name] (01/27/2016)  . COPD (chronic obstructive pulmonary disease) ([place])   . Depression    "light case"  . DVT (deep venous thrombosis) ([place]) 01/2016   a. LLE DVT 01/2016 - switched from Eliquis to Coumadin.  . Gastric stenosis    a. s/p stomach tube  . GERD (gastroesophageal reflux disease)   . History of blood transfusion    "several" (01/27/2016)  . History of stomach ulcers   . Hyperlipidemia   . Hypertension   . Hypothyroidism   . Paraesophageal hernia   . Perforated gastric ulcer ([place])   . Seasonal allergies   . SIADH (syndrome of inappropriate ADH production) ([place])    [name] 01/10/2015  . Small bowel obstruction ([place])    "I don't know how many" (01/11/2015)  . Stroke ([place])    "light one"  . Type II diabetes mellitus ([place])    "related to prednisone use  for > 20 yr; once predinose stopped; no more DM RX" (01/27/2016)  .  Ventral hernia with bowel obstruction    Past Surgical History:  Procedure Laterality Date  . CATARACT EXTRACTION W/ INTRAOCULAR LENS  IMPLANT, BILATERAL    . CHOLECYSTECTOMY OPEN    . COLECTOMY    . ESOPHAGOGASTRODUODENOSCOPY N/A 01/19/2014   Procedure: ESOPHAGOGASTRODUODENOSCOPY (EGD);  Surgeon: Irene Shipper, MD;  Location: [facility];  Service: Endoscopy;  Laterality: N/A;  . ESOPHAGOGASTRODUODENOSCOPY N/A 01/20/2014   Procedure: ESOPHAGOGASTRODUODENOSCOPY (EGD);  Surgeon: Irene Shipper, MD;  Location: [facility];  Service: Endoscopy;  Laterality: N/A;  . ESOPHAGOGASTRODUODENOSCOPY N/A 03/19/2014   Procedure: ESOPHAGOGASTRODUODENOSCOPY (EGD);  Surgeon: Milus Banister, MD;  Location: [facility];  Service: Endoscopy;  Laterality: N/A;  . ESOPHAGOGASTRODUODENOSCOPY N/A 07/08/2015   Procedure: ESOPHAGOGASTRODUODENOSCOPY (EGD);  Surgeon: Doran Stabler, MD;  Location: [facility] ENDOSCOPY;  Service: Endoscopy;  Laterality: N/A;  . ESOPHAGOGASTRODUODENOSCOPY (EGD) WITH PROPOFOL N/A 09/15/2015   Procedure: ESOPHAGOGASTRODUODENOSCOPY (EGD) WITH PROPOFOL;  Surgeon: Manus Gunning, MD;  Location: [facility] END[facility];  Service: Gastroenterology;  Laterality: N/A;  . GASTROJEJUNOSTOMY     hx/notes 01/10/2015  . GASTROJEJUNOSTOMY N/A 09/23/2015   Procedure: OPEN GASTROJEJUNOSTOMY TUBE PLACEMENT;  Surgeon: Mickeal Skinner, MD;  Location:  [facility] ORS;  Service: General;  Laterality: N/A;  . GLAUCOMA SURGERY Bilateral   . HERNIA REPAIR  2015  . IR GENERIC HISTORICAL  01/07/2016   IR GJ TUBE CHANGE 01/07/2016 Jacqulynn Cadet, MD [facility]  . IR GENERIC HISTORICAL  01/27/2016   IR MECH REMOV OBSTRUC MAT ANY COLON TUBE W/FLUORO 01/27/2016 Markus Daft, MD [facility]  . IR GENERIC HISTORICAL  02/07/2016   IR PATIENT EVAL TECH 0-60 MINS Darrell K Allred, PA-C [facility]  . IR GENERIC HISTORICAL  02/08/2016   IR GJ TUBE CHANGE 02/08/2016 Greggory Keen, MD [facility]  . IR GENERIC HISTORICAL  01/06/2016    IR GJ TUBE CHANGE 01/06/2016 [facility] OUT REF  . IR GENERIC HISTORICAL  05/02/2016   IR CM INJ ANY COLONIC TUBE W/FLUORO 05/02/2016 Arne Cleveland, MD [facility]  . IR GENERIC HISTORICAL  05/15/2016   IR GJ TUBE CHANGE 05/15/2016 Sandi Mariscal, MD [facility]  . IR GENERIC HISTORICAL  06/28/2016   IR GJ TUBE CHANGE 06/28/2016 [facility]  . LAPAROTOMY N/A 01/20/2015   Procedure: EXPLORATORY LAPAROTOMY;  Surgeon: Coralie Keens, MD;  Location: [facility];  Service: General;  Laterality: N/A;  . LYSIS OF ADHESION N/A 01/20/2015   Procedure: LYSIS OF ADHESIONS < 1 HOUR;  Surgeon: Coralie Keens, MD;  Location: [facility];  Service: General;  Laterality: N/A;  . TONSILLECTOMY    . TUBAL LIGATION    . VENTRAL HERNIA REPAIR  2015   incarcerated ventral hernia ([facility] 09/2013)/notes 01/10/2015   Family History  Problem Relation Age of Onset  . Stroke Mother   . Hypertension Mother   . Diabetes Brother   . Heart attack Neg Hx    History  Sexual Activity  . Sexual activity: No    Outpatient Encounter Prescriptions as of 08/30/2016  Medication Sig  . acetaminophen (TYLENOL) 500 MG tablet Take 1,000 mg by mouth every 6 (six) hours as needed (pain).  . AMINO ACIDS-PROTEIN HYDROLYS PO Take 30 mLs by mouth 2 (two) times daily.  [name] atorvastatin (LIPITOR) 10 MG tablet TAKE ONE TABLET BY MOUTH ONCE DAILY  . b complex vitamins tablet Take 1 tablet by mouth daily.   . calcium-vitamin D (OSCAL WITH D) 500-200 MG-UNIT per tablet Take 1 tablet by mouth daily with breakfast.   . carvedilol (COREG) 6.25 MG tablet Take 1 tablet (6.25 mg total) by mouth 2 (two) times daily.  . chlorhexidine (PERIDEX) 0.12 % solution Use as directed 15 mLs in the mouth or throat 2 (two) times daily.  . fluticasone (FLONASE) 50 MCG/ACT nasal spray Place 2 sprays into both nostrils daily.  . furosemide (LASIX) 20 MG tablet Take 1 tablet (20 mg total) by mouth daily.  [name] gabapentin (NEURONTIN) 300 MG capsule 1 tablet by mouth three times  daily, may have additional dose every 8 hours as needed pain (Patient taking differently: Take 300-600 mg by mouth 3 (three) times daily. Take 600mg  by mouth in the morning, 300mg  by mouth in the afternoon, and 335my by mouth at bedtime)  . insulin aspart (NOVOLOG FLEXPEN) 100 UNIT/ML FlexPen Before each meal 3 times a day, 140-199 - 2 units, 200-250 - 4 units, 251-299 - 6 units,  300-349 - 8 units,  350 or above 10 units. Insulin PEN if approved, provide syringes and needles if needed.  [name] ipratropium-albuterol (DUONEB) 0.5-2.5 (3) MG/3ML SOLN Take 3 mLs by nebulization every 6 (six) hours as needed (sob). Use 3 times daily x 4 days then  every 6 hours as needed.  . isosorbide mononitrate (IMDUR) 30 MG 24 hr tablet Take 30 mg by mouth daily.  [name] latanoprost (XALATAN) 0.005 % ophthalmic solution Place 1 drop into both eyes at bedtime.  . levalbuterol (XOPENEX HFA) 45 MCG/ACT inhaler Inhale 2 puffs into the lungs every 6 (six) hours as needed for wheezing or shortness of breath.  . levothyroxine (SYNTHROID, LEVOTHROID) 137 MCG tablet TAKE 1 TABLET (137 MCG TOTAL) BY MOUTH DAILY BEFORE BREAKFAST.  [name] losartan (COZAAR) 50 MG tablet Take 50 mg by mouth daily.  . Maltodextrin-Xanthan Gum (RESOURCE THICKENUP CLEAR) POWD Take 120 g by mouth as needed (use to thicken liquids.).  [name] Multiple Vitamin (MULTIVITAMIN WITH MINERALS) TABS tablet Take 1 tablet by mouth daily.  . Nutritional Supplements (FEEDING SUPPLEMENT, OSMOLITE 1.5 CAL,) LIQD Place 1,000 mLs into feeding tube Nightly.  Vladimir Faster Glycol-Propyl Glycol 0.4-0.3 % SOLN Place 1 drop into both eyes 4 (four) times daily.   . polyethylene glycol (MIRALAX / GLYCOLAX) packet Take 17 g by mouth daily as needed for mild constipation.  . RESTASIS 0.05 % ophthalmic emulsion USE 1 DROP INTO BOTH EYES TWICE DAILY  . sertraline (ZOLOFT) 100 MG tablet TAKE ONE TABLET BY MOUTH ONCE DAILY FOR DEPRESSION  . warfarin (COUMADIN) 2.5 MG tablet TAKE AS DIRECTED BY COUMADIN  CLINIC.  [name] Water For Irrigation, Sterile (FREE WATER) SOLN Place 230 mLs into feeding tube 3 (three) times daily.  . [DISCONTINUED] Insulin Glargine (LANTUS SOLOSTAR) 100 UNIT/ML Solostar Pen Inject 5 Units into the skin daily at 10 pm.  . [DISCONTINUED] Nutritional Supplements (FEEDING SUPPLEMENT, GLUCERNA 1.2 CAL,) LIQD Glucerna 1.2 @ 55 mL/hr from 2100-0900  . [DISCONTINUED] predniSONE (DELTASONE) 10 MG tablet Label  & dispense according to the schedule below. 6 Pills PO on day one then, 5 Pills PO on day two, 4 Pills PO on day three, 3 Pills PO on day four, 2 Pills PO on day five, 1 Pills PO on day six,  then STOP.   No facility-administered encounter medications on file as of 08/30/2016.     Activities of Daily Living In your present state of health, do you have any difficulty performing the following activities: 08/30/2016 08/12/2016  Hearing? Tempie Donning  Vision? Y Y  Difficulty concentrating or making decisions? Tempie Donning  Walking or climbing stairs? Y Y  Dressing or bathing? Y Y  Doing errands, shopping? Tempie Donning  Preparing Food and eating ? Y -  Using the Toilet? Y -  In the past six months, have you accidently leaked urine? N -  Do you have problems with loss of bowel control? N -  Managing your Medications? Y -  Managing your Finances? Y -  Housekeeping or managing your Housekeeping? Y -  Some recent data might be hidden    Patient Care Team: Gildardo Cranker, DO as PCP - General (Internal Medicine) Riccardo Dubin, MD as Referring Physician (Cardiology) Alveta Heimlich, MD as Referring Physician (Ophthalmology) Edrick Kins, DPM as Consulting Physician (Podiatry)    Assessment:     Exercise Activities and Dietary recommendations Current Exercise Habits: The patient does not participate in regular exercise at present, Exercise limited by: orthopedic condition(s)  Goals    . More Housework          Pt would like to do more housework and stay active.      Fall  Risk Fall Risk  08/30/2016 08/07/2016 06/02/2016 03/24/2016 02/17/2016  Falls in the past year?  Yes Yes No No No  Number falls in past yr: 1 2 or more - - -  Injury with Fall? No No - - -   Depression Screen PHQ 2/9 Scores 08/30/2016 05/13/2015 11/25/2013  PHQ - 2 Score 0 0 0     Cognitive Function MMSE - Mini Mental State Exam 08/30/2016 08/29/2015 11/06/2014  Orientation to time 5 5 5   Orientation to Place 4 5 5   Registration 3 3 3   Attention/ Calculation 4 5 3   Recall 0 2 2  Language- name 2 objects 2 2 2   Language- repeat 1 1 1   Language- follow 3 step command 3 3 3   Language- read & follow direction 1 1 1   Write a sentence 0 0 0  Copy design 0 0 1  Total score 23 27 26         Immunization History  Administered Date(s) Administered  . Influenza,inj,Quad PF,36+ Mos 12/24/2013, 01/27/2015, 12/29/2015  . Influenza-Unspecified 12/02/2012  . Pneumococcal Conjugate-13 02/03/2014  . Tdap 07/18/2016  . Zoster 04/12/2011   Screening Tests Health Maintenance  Topic Date Due  . PNA vac Low Risk Adult (2 of 2 - PPSV23) 02/04/2015  . OPHTHALMOLOGY EXAM  07/17/2015  . MAMMOGRAM  10/23/2015  . INFLUENZA VACCINE  11/01/2016  . HEMOGLOBIN A1C  02/16/2017  . FOOT EXAM  08/22/2017  . TETANUS/TDAP  07/19/2026  . DEXA SCAN  Completed      Plan:    I have personally reviewed and addressed the Medicare Annual Wellness questionnaire and have noted the following in the patient's chart:  A. Medical and social history B. Use of alcohol, tobacco or illicit drugs  C. Current medications and supplements D. Functional ability and status E.  Nutritional status F.  Physical activity G. Advance directives H. List of other physicians I.  Hospitalizations, surgeries, and ER visits in previous 12 months J.  [place] to include hearing, vision, cognitive, depression L. Referrals and appointments - none  In addition, I have reviewed and discussed with patient certain preventive  protocols, quality metrics, and best practice recommendations. A written personalized care plan for preventive services as well as general preventive health recommendations were provided to patient.  See attached scanned questionnaire for additional information.   Signed,   Rich Reining, RN Nurse Health Advisor   I have reviewed the health advisor's note and was available for consultation. I agree with documentation and plan.   Monica S. Perlie Gold  [facility] [address] [place], [place] [address] [phone] Cell (Monday-Friday 8 AM - 5 PM) [phone] After 5 PM and follow prompts

## 2016-08-31 ENCOUNTER — Telehealth: Payer: Self-pay | Admitting: Physician Assistant

## 2016-08-31 DIAGNOSIS — Z0000 Encounter for general adult medical examination without abnormal findings: Secondary | ICD-10-CM | POA: Diagnosis not present

## 2016-08-31 DIAGNOSIS — Z23 Encounter for immunization: Secondary | ICD-10-CM | POA: Diagnosis not present

## 2016-08-31 NOTE — Telephone Encounter (Signed)
Patient's daughter Shannon [name] [facility] on file) called stating that her mother has been SOB with activity and rest since she was discharged from the hospital 08/18/16. She states that she has some lower extremity swelling and 2lb. weight gain in the last week. Daughter states that her mother has had low BP. Patient has been taking furosemide 20 mg QD, carvedilol 6.25 BID and losartan 50 mg QD. Patient was seen by her PCP yesterday with BP of 118/44.  PCP instructed for patient to STOP losartan. Patient's BP this morning 122/50. PCP assessment yesterday shows:  +1 pitting LLE edema with calf TTP and palpable cord; trace RLE with No calf TTP; loss of hair on leg b/l due to swelling. Left calf varicose veins, soft, NT  Pulmonary/Chest: Effort normal. No stridor. No respiratory distress. She has no wheezes. She has no rales.   At the [facility] yesterday the PCP advised patient to stop losartan, continue all other medications, and to use nebulizer 3x/day for cough, wheezing, and SOB.   Patient has appointment with Shannon Barrios, PA on Monday.

## 2016-08-31 NOTE — Telephone Encounter (Signed)
New message   Daughter calling   Pt c/o Shortness Of Breath: STAT if SOB developed within the last 24 hours or pt is noticeably SOB on the phone  1. Are you currently SOB (can you hear that pt is SOB on the phone)?  Yes   2. How long have you been experiencing SOB? Since she was discharged from hospital   3. Are you SOB when sitting or when up moving around? both  4. Are you currently experiencing any other symptoms? bp is running low , retaining fluid (legs)  Has appt 6/4 Shannon [name]

## 2016-09-01 DIAGNOSIS — E11621 Type 2 diabetes mellitus with foot ulcer: Secondary | ICD-10-CM | POA: Diagnosis not present

## 2016-09-01 DIAGNOSIS — J441 Chronic obstructive pulmonary disease with (acute) exacerbation: Secondary | ICD-10-CM | POA: Diagnosis not present

## 2016-09-01 DIAGNOSIS — L97523 Non-pressure chronic ulcer of other part of left foot with necrosis of muscle: Secondary | ICD-10-CM | POA: Diagnosis not present

## 2016-09-01 DIAGNOSIS — I2510 Atherosclerotic heart disease of native coronary artery without angina pectoris: Secondary | ICD-10-CM | POA: Diagnosis not present

## 2016-09-01 DIAGNOSIS — I130 Hypertensive heart and chronic kidney disease with heart failure and stage 1 through stage 4 chronic kidney disease, or unspecified chronic kidney disease: Secondary | ICD-10-CM | POA: Diagnosis not present

## 2016-09-01 DIAGNOSIS — L97521 Non-pressure chronic ulcer of other part of left foot limited to breakdown of skin: Secondary | ICD-10-CM | POA: Diagnosis not present

## 2016-09-04 ENCOUNTER — Ambulatory Visit (INDEPENDENT_AMBULATORY_CARE_PROVIDER_SITE_OTHER): Payer: Medicare Other | Admitting: Physician Assistant

## 2016-09-04 ENCOUNTER — Other Ambulatory Visit: Payer: Self-pay | Admitting: Internal Medicine

## 2016-09-04 ENCOUNTER — Encounter: Payer: Self-pay | Admitting: Physician Assistant

## 2016-09-04 VITALS — BP 130/72 | HR 54 | Ht 66.0 in | Wt 154.8 lb

## 2016-09-04 DIAGNOSIS — I5033 Acute on chronic diastolic (congestive) heart failure: Secondary | ICD-10-CM

## 2016-09-04 DIAGNOSIS — I481 Persistent atrial fibrillation: Secondary | ICD-10-CM | POA: Diagnosis not present

## 2016-09-04 DIAGNOSIS — I10 Essential (primary) hypertension: Secondary | ICD-10-CM | POA: Diagnosis not present

## 2016-09-04 DIAGNOSIS — R001 Bradycardia, unspecified: Secondary | ICD-10-CM

## 2016-09-04 DIAGNOSIS — I70262 Atherosclerosis of native arteries of extremities with gangrene, left leg: Secondary | ICD-10-CM

## 2016-09-04 DIAGNOSIS — I4819 Other persistent atrial fibrillation: Secondary | ICD-10-CM

## 2016-09-04 DIAGNOSIS — J449 Chronic obstructive pulmonary disease, unspecified: Secondary | ICD-10-CM

## 2016-09-04 MED ORDER — POTASSIUM CHLORIDE CRYS ER 20 MEQ PO TBCR: 20.0000 meq | EXTENDED_RELEASE_TABLET | Freq: Every day | ORAL | 0 refills | Status: DC

## 2016-09-04 NOTE — Patient Instructions (Signed)
Medication Instructions:  Your physician has recommended you make the following change in your medication:  1. Take two tablets (40 mg ) for 3 days than go back to one tablet (20 mg ) daily. 2. Take Potassium (20 meq) for 3 days than STOP!   Labwork: -None  Testing/Procedures: -None  Follow-Up: Your physician wants you to follow-up in: 4 months with Dr. Meda Coffee.  You will receive a reminder letter in the mail two months in advance. If you don't receive a letter, please call our office to schedule the follow-up appointment.   Any Other Special Instructions Will Be Listed Below (If Applicable).  Keep track heart rate if below 50 call office at [phone].   If you need a refill on your cardiac medications before your next appointment, please call your pharmacy.

## 2016-09-04 NOTE — Telephone Encounter (Signed)
Dtr ([facility] on file) reports pt does not have potassium pills at home. Rx for K+ 20 mEq once daily x 3 days sent to [facility]. Dtr verbalized understanding and agreeable to plan.

## 2016-09-04 NOTE — Progress Notes (Signed)
Cardiology Office Note    Date:  09/04/2016   ID:  Shannon [name], DOB February 15, 1924, MRN [id number]  PCP:  Kirt Boys, DO  Cardiologist: Dr. Delton See  Chief Complaint  Patient presents with  . Follow-up    post hospital    History of Present Illness:  Shannon [name] is a 83 y.o. female with hypertension, hyperlipidemia, previous stroke, COPD and asthma, chronic atrial fibrillation, type 2 diabetes mellitus, hypothyroidism, gastric outlet obstruction with previous PEG tube, and diastolic heart failure. She also had history of symptomatic bradycardia and all AV nodal agents were stopped including amiodarone. When I saw her back in 06/2016 her heart rate was in the 50s. CHADSVASC=9 on Coumadin.  Patient had recent hospitalization that we were not involved in her care. She was admitted with significant dyspnea felt secondary to acute COPD exacerbation and recurrent aspiration pneumonia but improved with nebulizers and Solu-Medrol. Lasix had been stopped sometime as an outpatient and it was resumed. Norvasc was discontinued and palliative care was consulted to be seen Thurs. losartan was stopped by primary care because of low blood pressures. Her Coreg was resumed in the hospital.  Patient comes in today accompanied by her daughter. She continues to have shortness of breath, wheezing, chest pain. She has osteomyelitis in her foot has trouble getting around. She has had 4 pound weight gain and increased edema over the past several days. She has not gotten any extra salt.      Past Medical History:  Diagnosis Date  . Anemia    previous blood transfusions  . Arthritis    "all over"  . Asthma   . Bradycardia    requiring previous d/c of BB and reduction of amiodarone  . CAD (coronary artery disease)    nonobstructive per notes  . Chronic diastolic CHF (congestive heart failure) (HCC)   . CKD (chronic kidney disease), stage III   . Complication of blood transfusion    "got the wrong blood  type at [facility] in ~ 2015; no adverse reaction that we are aware of"/daughter, [name] (01/27/2016)  . COPD (chronic obstructive pulmonary disease) (HCC)   . Depression    "light case"  . DVT (deep venous thrombosis) (HCC) 01/2016   a. LLE DVT 01/2016 - switched from Eliquis to Coumadin.  . Gastric stenosis    a. s/p stomach tube  . GERD (gastroesophageal reflux disease)   . History of blood transfusion    "several" (01/27/2016)  . History of stomach ulcers   . Hyperlipidemia   . Hypertension   . Hypothyroidism   . Paraesophageal hernia   . Perforated gastric ulcer (HCC)   . Seasonal allergies   . SIADH (syndrome of inappropriate ADH production) (HCC)    Shannon [name] 01/10/2015  . Small bowel obstruction (HCC)    "I don't know how many" (01/11/2015)  . Stroke (HCC)    "light one"  . Type II diabetes mellitus (HCC)    "related to prednisone use  for > 20 yr; once predinose stopped; no more DM RX" (01/27/2016)  . Ventral hernia with bowel obstruction     Past Surgical History:  Procedure Laterality Date  . CATARACT EXTRACTION W/ INTRAOCULAR LENS  IMPLANT, BILATERAL    . CHOLECYSTECTOMY OPEN    . COLECTOMY    . ESOPHAGOGASTRODUODENOSCOPY N/A 01/19/2014   Procedure: ESOPHAGOGASTRODUODENOSCOPY (EGD);  Surgeon: Hilarie Fredrickson, MD;  Location: [facility];  Service: Endoscopy;  Laterality: N/A;  . ESOPHAGOGASTRODUODENOSCOPY N/A 01/20/2014   Procedure: ESOPHAGOGASTRODUODENOSCOPY (  EGD);  Surgeon: Hilarie Fredrickson, MD;  Location: [facility];  Service: Endoscopy;  Laterality: N/A;  . ESOPHAGOGASTRODUODENOSCOPY N/A 03/19/2014   Procedure: ESOPHAGOGASTRODUODENOSCOPY (EGD);  Surgeon: Rachael Fee, MD;  Location: [facility];  Service: Endoscopy;  Laterality: N/A;  . ESOPHAGOGASTRODUODENOSCOPY N/A 07/08/2015   Procedure: ESOPHAGOGASTRODUODENOSCOPY (EGD);  Surgeon: Sherrilyn Rist, MD;  Location: [facility] ENDOSCOPY;  Service: Endoscopy;  Laterality: N/A;  . ESOPHAGOGASTRODUODENOSCOPY (EGD) WITH  PROPOFOL N/A 09/15/2015   Procedure: ESOPHAGOGASTRODUODENOSCOPY (EGD) WITH PROPOFOL;  Surgeon: Ruffin Frederick, MD;  Location: [facility];  Service: Gastroenterology;  Laterality: N/A;  . GASTROJEJUNOSTOMY     hx/notes 01/10/2015  . GASTROJEJUNOSTOMY N/A 09/23/2015   Procedure: OPEN GASTROJEJUNOSTOMY TUBE PLACEMENT;  Surgeon: De Blanch Kinsinger, MD;  Location: [facility] ORS;  Service: General;  Laterality: N/A;  . GLAUCOMA SURGERY Bilateral   . HERNIA REPAIR  2015  . IR GENERIC HISTORICAL  01/07/2016   IR GJ TUBE CHANGE 01/07/2016 Malachy Moan, MD [facility]  . IR GENERIC HISTORICAL  01/27/2016   IR MECH REMOV OBSTRUC MAT ANY COLON TUBE W/FLUORO 01/27/2016 Richarda Overlie, MD [facility]  . IR GENERIC HISTORICAL  02/07/2016   IR PATIENT EVAL TECH 0-60 MINS Darrell K Allred, PA-C [facility]  . IR GENERIC HISTORICAL  02/08/2016   IR GJ TUBE CHANGE 02/08/2016 Berdine Dance, MD [facility]  . IR GENERIC HISTORICAL  01/06/2016   IR GJ TUBE CHANGE 01/06/2016 [facility] OUT REF  . IR GENERIC HISTORICAL  05/02/2016   IR CM INJ ANY COLONIC TUBE W/FLUORO 05/02/2016 Oley Balm, MD [facility]  . IR GENERIC HISTORICAL  05/15/2016   IR GJ TUBE CHANGE 05/15/2016 Simonne Come, MD [facility]  . IR GENERIC HISTORICAL  06/28/2016   IR GJ TUBE CHANGE 06/28/2016 [facility]  . LAPAROTOMY N/A 01/20/2015   Procedure: EXPLORATORY LAPAROTOMY;  Surgeon: Abigail Miyamoto, MD;  [facility];  Service: General;  Laterality: N/A;  . LYSIS OF ADHESION N/A 01/20/2015   Procedure: LYSIS OF ADHESIONS < 1 HOUR;  Surgeon: Abigail Miyamoto, MD;  Location: [facility] OR;  Service: General;  Laterality: N/A;  . TONSILLECTOMY    . TUBAL LIGATION    . VENTRAL HERNIA REPAIR  2015   incarcerated ventral hernia ([facility] 09/2013)/notes 01/10/2015    Current Medications: Outpatient Medications Prior to Visit  Medication Sig Dispense Refill  . acetaminophen (TYLENOL) 500 MG tablet Take 1,000 mg by mouth every 6 (six) hours as needed  (pain).    . AMINO ACIDS-PROTEIN HYDROLYS PO Take 30 mLs by mouth 2 (two) times daily.    [name] atorvastatin (LIPITOR) 10 MG tablet TAKE ONE TABLET BY MOUTH ONCE DAILY 90 tablet 1  . b complex vitamins tablet Take 1 tablet by mouth daily.     . calcium-vitamin D (OSCAL WITH D) 500-200 MG-UNIT per tablet Take 1 tablet by mouth daily with breakfast.     . carvedilol (COREG) 6.25 MG tablet Take 1 tablet (6.25 mg total) by mouth 2 (two) times daily. 60 tablet 0  . chlorhexidine (PERIDEX) 0.12 % solution Use as directed 15 mLs in the mouth or throat 2 (two) times daily.    . fluticasone (FLONASE) 50 MCG/ACT nasal spray Place 2 sprays into both nostrils daily. 16 g 3  . furosemide (LASIX) 20 MG tablet Take 1 tablet (20 mg total) by mouth daily. 30 tablet 3  . gabapentin (NEURONTIN) 300 MG capsule 1 tablet by mouth three times daily, may have additional dose every 8  hours as needed pain (Patient taking differently: Take 300-600 mg by mouth 3 (three) times daily. Take 600mg  by mouth in the morning, 300mg  by mouth in the afternoon, and by mouth at bedtime) 90 capsule 0  . insulin aspart (NOVOLOG FLEXPEN) 100 UNIT/ML FlexPen Before each meal 3 times a day, 140-199 - 2 units, 200-250 - 4 units, 251-299 - 6 units,  300-349 - 8 units,  350 or above 10 units. Insulin PEN if approved, provide syringes and needles if needed. 15 mL 0  . ipratropium-albuterol (DUONEB) 0.5-2.5 (3) MG/3ML SOLN Take 3 mLs by nebulization every 6 (six) hours as needed (sob). Use 3 times daily x 4 days then every 6 hours as needed. 360 mL 4  . isosorbide mononitrate (IMDUR) 30 MG 24 hr tablet Take 30 mg by mouth daily.    [name] latanoprost (XALATAN) 0.005 % ophthalmic solution Place 1 drop into both eyes at bedtime. 2.5 mL 12  . levalbuterol (XOPENEX HFA) 45 MCG/ACT inhaler Inhale 2 puffs into the lungs every 6 (six) hours as needed for wheezing or shortness of breath. 1 Inhaler 12  . levothyroxine (SYNTHROID, LEVOTHROID) 137 MCG tablet TAKE  1 TABLET (137 MCG TOTAL) BY MOUTH DAILY BEFORE BREAKFAST. 30 tablet 2  . Maltodextrin-Xanthan Gum (RESOURCE THICKENUP CLEAR) POWD Take 120 g by mouth as needed (use to thicken liquids.). 20 Can 0  . Multiple Vitamin (MULTIVITAMIN WITH MINERALS) TABS tablet Take 1 tablet by mouth daily.    . Nutritional Supplements (FEEDING SUPPLEMENT, GLUCERNA 1.2 CAL,) LIQD glucerna 1.2 cal at 55cc/hr from 9pm to 9am, with Nepro 237 mL po q24h, Prostat 30 mL per tube BID") due to hyperglycemia 1500 mL 5  . Polyethyl Glycol-Propyl Glycol 0.4-0.3 % SOLN Place 1 drop into both eyes 4 (four) times daily.     . polyethylene glycol (MIRALAX / GLYCOLAX) packet Take 17 g by mouth daily as needed for mild constipation.    . RESTASIS 0.05 % ophthalmic emulsion USE 1 DROP INTO BOTH EYES TWICE DAILY 60 mL 3  . sertraline (ZOLOFT) 100 MG tablet TAKE ONE TABLET BY MOUTH ONCE DAILY FOR DEPRESSION 90 tablet 1  . warfarin (COUMADIN) 2.5 MG tablet TAKE AS DIRECTED BY COUMADIN CLINIC. 40 tablet 1  . Water For Irrigation, Sterile (FREE WATER) SOLN Place 230 mLs into feeding tube 3 (three) times daily.    [name] losartan (COZAAR) 50 MG tablet Take 50 mg by mouth daily.  2   No facility-administered medications prior to visit.      Allergies:   Penicillins and Other   Social History   Social History  . Marital status: Widowed    Spouse name: N/A  . Number of children: 3  . Years of education: N/A   Social History Main Topics  . Smoking status: Never Smoker  . Smokeless tobacco: Former Neurosurgeon    Types: Snuff     Comment: "used snuff in my younger days"  . Alcohol use No  . Drug use: No  . Sexual activity: No   Other Topics Concern  . None   Social History Narrative   Married in [place], lives in one story house with 2 people and no pets   Occupation: ?   Has a living Will, [place], and doesn't have DNR   Was living with Husband (in frail health) in [place] until 09/2013.  After her [facility] admission they both came to live with  daughter in [place]     Family History:  The patient's family history includes Diabetes in her brother; Hypertension in her mother; Stroke in her mother.   ROS:   Please see the history of present illness.    Review of Systems  Constitution: Positive for weight gain.  HENT: Negative.   Eyes: Positive for visual disturbance.  Cardiovascular: Positive for chest pain, dyspnea on exertion and leg swelling.  Respiratory: Positive for cough, shortness of breath and wheezing.   Hematologic/Lymphatic: Negative.   Musculoskeletal: Positive for arthritis, back pain, joint pain and joint swelling.  Gastrointestinal: Negative.   Genitourinary: Negative.   Neurological: Negative.    All other systems reviewed and are negative.   PHYSICAL EXAM:   VS:  BP 130/72   Pulse (!) 54   Ht 5\' 6"  (1.676 m)   Wt 154 lb 12.8 oz (70.2 kg)   SpO2 99%   BMI 24.99 kg/m   Physical Exam  GEN: Well nourished, well developed, in no acute distress  Neck: increased JVD, no carotid bruits, or masses Cardiac:RRR; 1 to 2/6 systolic murmur at the left sternal border, no rubs, or gallops  Respiratory: Decreased breath sounds with diffuse expiratory wheezing GI: soft, nontender, nondistended, + BS Ext: 1-2+ edema bilaterally left greater than right, left foot in a boot Neuro:  Alert and Oriented x 3 Psych: euthymic mood, full affect  Wt Readings from Last 3 Encounters:  09/04/16 154 lb 12.8 oz (70.2 kg)  08/30/16 153 lb (69.4 kg)  08/30/16 153 lb (69.4 kg)      Studies/Labs Reviewed:   EKG:  EKG is not ordered today.  Recent Labs: 05/08/2016: NT-Pro BNP 1,132 08/12/2016: B Natriuretic Peptide 92.2 08/16/2016: ALT 39 08/18/2016: BUN 49; Creatinine, Ser 0.96; Hemoglobin 10.3; Magnesium 1.9; Platelets 383; Potassium 4.0; Sodium 135; TSH 0.670   Lipid Panel    Component Value Date/Time   CHOL 126 04/07/2015 1015   TRIG 107 04/07/2015 1015   HDL 69 04/07/2015 1015   CHOLHDL 1.8 04/07/2015 1015    CHOLHDL 1.9 12/24/2013 0456   VLDL 26 12/24/2013 0456   LDLCALC 36 04/07/2015 1015    Additional studies/ records that were reviewed today include:   2-D echo 10/2017Study Conclusions   - Left ventricle: The cavity size was normal. Wall thickness was   normal. Systolic function was normal. The estimated ejection   fraction was in the range of 55% to 60%. Wall motion was normal;   there were no regional wall motion abnormalities. Doppler   parameters are consistent with abnormal left ventricular   relaxation (grade 1 diastolic dysfunction). - Aortic valve: There was mild regurgitation. - Mitral valve: There was mild regurgitation. - Left atrium: The atrium was moderately dilated.   Impressions:   - Normal LV systolic function; grade 1 diastolic dysfunction; mild   AI; mild MR; moderate LAE.    Echocardiogram 05/09/2016: Study Conclusions   - Left ventricle: The cavity size was normal. Wall thickness was   normal. Systolic function was normal. The estimated ejection   fraction was in the range of 60% to 65%. Wall motion was normal;   there were no regional wall motion abnormalities. - Aortic valve: There was trivial regurgitation. - Left atrium: The atrium was mildly dilated. Volume/bsa, ES,   (1-plane Simpson&'s, A2C): 45.6 ml/m^2. - Right atrium: The atrium was moderately dilated. - Tricuspid valve: There was moderate regurgitation. - Pulmonary arteries: Systolic pressure was mildly increased. PA   peak pressure: 31 mm Hg (S).   Impressions:   - No  cardiac source of emboli was indentified     ASSESSMENT:    1. Acute on chronic diastolic CHF (congestive heart failure), NYHA class 4 (HCC)   2. Essential hypertension, benign   3. Persistent atrial fibrillation (HCC)   4. Chronic obstructive pulmonary disease, unspecified COPD type (HCC)   5. Symptomatic bradycardia      PLAN:  In order of problems listed above: Acute on chronic diastolic CHF with edema and weight  gain. Recommend increase Lasix to 40 mg daily for 3 days potassium 20 mEq daily for 3 days then back to Lasix 20 mg once daily and no potassium. The patient's*thinks they have liquid potassium at home and will call to verify this with [place]. I wonder if it is secondary to stopping the losartan earlier in the week because of hypotension. They will weigh the patient daily and call us if her weight increases by 2-3 pounds overnight.  Essential hypertension blood pressure is stable today but she has been hypotensive so losartan was stopped.  Persistent atrial fibrillation with CHADSVASC=9 on Coumadin. Had significant bradycardia in the past and all AV nodal blocking agents were stopped. Her carvedilol was restarted this past hospitalization by primary care. I suspect her heart rate was up because of all the pulmonary nebulizer treatments. Her heart rate is 54 today. I've asked her daughter to call as if her heart rate dips below 50 and we'll have to stop the carvedilol.  COPD with wheezing throughout on exam today. Patient is trying to do nebulizers 3 times a day but struggles to get it in twice a day. Palliative care is seeing her on Thursday. Hopefully they can help [place] manage her as an outpatient.  Symptomatic bradycardia requiring [place] to stop all AV nodal blocking agents in the past that she is back on carvedilol now. We'll watch closely. Daughter will call us for heart rates lower than 50. Follow-up with Dr. Delton See at next available visit. I'm happy to see back any time.     Medication Adjustments/Labs and Tests Ordered: Current medicines are reviewed at length with the patient today.  Concerns regarding medicines are outlined above.  Medication changes, Labs and Tests ordered today are listed in the Patient Instructions below. Patient Instructions  Medication Instructions:  Your physician has recommended you make the following change in your medication:  1. Take two tablets (40 mg ) for 3 days than go  back to one tablet (20 mg ) daily. 2. Take Potassium (20 meq) for 3 days than STOP!   Labwork: -None  Testing/Procedures: -None  Follow-Up: Your physician wants you to follow-up in: 4 months with Dr. Delton See.  You will receive a reminder letter in the mail two months in advance. If you don't receive a letter, please call our office to schedule the follow-up appointment.   Any Other Special Instructions Will Be Listed Below (If Applicable).  Keep track heart rate if below 50 call office at [phone].   If you need a refill on your cardiac medications before your next appointment, please call your pharmacy.      Elson Clan, PA-C  09/04/2016 12:44 PM    [facility] [address] [place], [place], [place]  [address] Phone: [phone]; Fax: [phone]

## 2016-09-04 NOTE — Telephone Encounter (Signed)
Follow Up Call :     [name] ( DPR On File ) is calling because she states that she needs a prescription for potassium. What she has at home is Kionex 60mg  .   Please call

## 2016-09-05 ENCOUNTER — Encounter: Payer: Medicare Other | Attending: Internal Medicine | Admitting: Internal Medicine

## 2016-09-05 DIAGNOSIS — E1152 Type 2 diabetes mellitus with diabetic peripheral angiopathy with gangrene: Secondary | ICD-10-CM | POA: Diagnosis not present

## 2016-09-05 DIAGNOSIS — Z7901 Long term (current) use of anticoagulants: Secondary | ICD-10-CM | POA: Diagnosis not present

## 2016-09-05 DIAGNOSIS — E1122 Type 2 diabetes mellitus with diabetic chronic kidney disease: Secondary | ICD-10-CM | POA: Diagnosis not present

## 2016-09-05 DIAGNOSIS — I130 Hypertensive heart and chronic kidney disease with heart failure and stage 1 through stage 4 chronic kidney disease, or unspecified chronic kidney disease: Secondary | ICD-10-CM | POA: Diagnosis not present

## 2016-09-05 DIAGNOSIS — L97511 Non-pressure chronic ulcer of other part of right foot limited to breakdown of skin: Secondary | ICD-10-CM | POA: Diagnosis not present

## 2016-09-05 DIAGNOSIS — J449 Chronic obstructive pulmonary disease, unspecified: Secondary | ICD-10-CM | POA: Diagnosis not present

## 2016-09-05 DIAGNOSIS — D649 Anemia, unspecified: Secondary | ICD-10-CM | POA: Insufficient documentation

## 2016-09-05 DIAGNOSIS — Z794 Long term (current) use of insulin: Secondary | ICD-10-CM | POA: Insufficient documentation

## 2016-09-05 DIAGNOSIS — S91101A Unspecified open wound of right great toe without damage to nail, initial encounter: Secondary | ICD-10-CM | POA: Diagnosis not present

## 2016-09-05 DIAGNOSIS — N183 Chronic kidney disease, stage 3 (moderate): Secondary | ICD-10-CM | POA: Diagnosis not present

## 2016-09-05 DIAGNOSIS — S91102A Unspecified open wound of left great toe without damage to nail, initial encounter: Secondary | ICD-10-CM | POA: Diagnosis not present

## 2016-09-05 DIAGNOSIS — M1990 Unspecified osteoarthritis, unspecified site: Secondary | ICD-10-CM | POA: Diagnosis not present

## 2016-09-05 DIAGNOSIS — K219 Gastro-esophageal reflux disease without esophagitis: Secondary | ICD-10-CM | POA: Insufficient documentation

## 2016-09-05 DIAGNOSIS — L97523 Non-pressure chronic ulcer of other part of left foot with necrosis of muscle: Secondary | ICD-10-CM | POA: Insufficient documentation

## 2016-09-05 DIAGNOSIS — M868X7 Other osteomyelitis, ankle and foot: Secondary | ICD-10-CM | POA: Insufficient documentation

## 2016-09-05 DIAGNOSIS — S91105A Unspecified open wound of left lesser toe(s) without damage to nail, initial encounter: Secondary | ICD-10-CM | POA: Diagnosis not present

## 2016-09-05 DIAGNOSIS — E1169 Type 2 diabetes mellitus with other specified complication: Secondary | ICD-10-CM | POA: Diagnosis not present

## 2016-09-06 DIAGNOSIS — E11621 Type 2 diabetes mellitus with foot ulcer: Secondary | ICD-10-CM | POA: Diagnosis not present

## 2016-09-06 DIAGNOSIS — L97523 Non-pressure chronic ulcer of other part of left foot with necrosis of muscle: Secondary | ICD-10-CM | POA: Diagnosis not present

## 2016-09-06 DIAGNOSIS — I2510 Atherosclerotic heart disease of native coronary artery without angina pectoris: Secondary | ICD-10-CM | POA: Diagnosis not present

## 2016-09-06 DIAGNOSIS — I130 Hypertensive heart and chronic kidney disease with heart failure and stage 1 through stage 4 chronic kidney disease, or unspecified chronic kidney disease: Secondary | ICD-10-CM | POA: Diagnosis not present

## 2016-09-06 DIAGNOSIS — L97521 Non-pressure chronic ulcer of other part of left foot limited to breakdown of skin: Secondary | ICD-10-CM | POA: Diagnosis not present

## 2016-09-06 DIAGNOSIS — J441 Chronic obstructive pulmonary disease with (acute) exacerbation: Secondary | ICD-10-CM | POA: Diagnosis not present

## 2016-09-06 NOTE — Progress Notes (Addendum)
N/A % Reduction in Volume: N/A N/A N/A Classification: Grade 2 N/A N/A Exudate Amount: Medium N/A N/A Exudate Type: Serosanguineous N/A N/A Exudate Color: red, brown N/A N/A Wound Margin: Distinct, outline attached N/A N/A Granulation Amount: None Present (0%) N/A N/A Granulation Quality: N/A N/A N/A Necrotic Amount: Large (67-100%) N/A N/A Necrotic Tissue: Adherent Slough N/A N/A Exposed Structures: Fat Layer (Subcutaneous N/A N/A Tissue) Exposed: Yes Fascia: No Tendon: No Muscle: No Joint: No Bone: No Epithelialization: None N/A N/A Periwound Skin Texture: Excoriation: No N/A  N/A Induration: No Callus: No Crepitus: No Rash: No Scarring: No Periwound Skin Maceration: Yes N/A N/A Moisture: Dry/Scaly: No DARIELLA, GILLIHAN ([id number]) Periwound Skin Color: Atrophie Blanche: No N/A N/A Cyanosis: No Ecchymosis: No Erythema: No Hemosiderin Staining: No Mottled: No Pallor: No Rubor: No Temperature: No Abnormality N/A N/A Tenderness on Yes N/A N/A Palpation: Wound Preparation: Ulcer Cleansing: N/A N/A Rinsed/Irrigated with Saline Topical Anesthetic Applied: Other: Lidocaine 4% Treatment Notes Electronic Signature(s) Signed: 09/05/2016 5:27:39 PM By: Linton Ham MD Previous Signature: 09/05/2016 10:56:21 AM Version By: Regan Lemming BSN, RN Entered By: Linton Ham on 09/05/2016 11:10:19 Shannon [name] ([id number]) -------------------------------------------------------------------------------- Multi-Disciplinary Care Plan Details Patient Name: Shannon [name] Date of Service: 09/05/2016 10:15 AM Medical Record Patient Account Number: [id number] Number: Treating RN: Baruch Gouty, RN, BSN, Rita 1923-04-13 [phone] y.o. Other Clinician: Date of Birth/Sex: Female) Treating ROBSON, MICHAEL Primary Care Mackenzie Groom: Eulas Post, MONICA Moises Terpstra/Extender: G Referring Promise Bushong: Gildardo Cranker Weeks in Treatment: 4 Active Inactive Electronic Signature(s) Signed: 09/27/2016 5:27:18 PM By: Gretta Cool, BSN, RN, CWS, Kim RN, BSN Signed: 09/28/2016 3:16:06 PM By: Regan Lemming BSN, RN Previous Signature: 09/05/2016 10:56:12 AM Version By: Regan Lemming BSN, RN Entered By: Gretta Cool, BSN, RN, CWS, Kim on 09/27/2016 17:27:18 Shannon [name] ([id number]) -------------------------------------------------------------------------------- Pain Assessment Details Patient Name: Shannon [name] Date of Service: 09/05/2016 10:15 AM Medical Record Patient Account Number: [id number] Number: Treating RN: Baruch Gouty, RN, BSN, Rita [birth year] (83 y.o. Other Clinician: Date of Birth/Sex: Female) Treating  ROBSON, MICHAEL Primary Care Vantasia Pinkney: Eulas Post, MONICA Kinta Martis/Extender: G Referring Damarion Mendizabal: Eulas Post MONICA Weeks in Treatment: 4 Active Problems Location of Pain Severity and Description of Pain Patient Has Paino Yes Site Locations Pain Location: Pain in Ulcers With Dressing Change: Yes Rate the pain. Current Pain Level: 3 Character of Pain Describe the Pain: Tender Pain Management and Medication Current Pain Management: How does your wound impact your activities of daily livingo Sleep: No Bathing: No Appetite: No Relationship With Others: No Bladder Continence: No Emotions: No Bowel Continence: No Work: No Toileting: No Drive: No Dressing: No Hobbies: No Electronic Signature(s) Signed: 09/05/2016 4:40:28 PM By: Regan Lemming BSN, RN Entered By: Regan Lemming on 09/05/2016 10:32:36 Shannon [name] ([id number]) -------------------------------------------------------------------------------- Patient/Caregiver Education Details Patient Name: Shannon [name] Date of Service: 09/05/2016 10:15 AM Medical Record Patient Account Number: [id number] Number: Treating RN: Baruch Gouty, RN, BSN, Rita 06-Jul-1923 (83 y.o. Other Clinician: Date of Birth/Gender: Female) Treating ROBSON, MICHAEL Primary Care Physician: Eulas Post, MONICA Physician/Extender: G Referring Physician: Sherron Monday in Treatment: 4 Education Assessment Education Provided To: Patient and Caregiver dtr Education Topics Provided Elevated Blood Sugar/ Impact on Healing: Methods: Explain/Verbal Responses: State content correctly Infection: Methods: Explain/Verbal Responses: State content correctly Nutrition: Methods: Explain/Verbal Responses: State content correctly Pain: Methods: Explain/Verbal Responses: State content correctly Peripheral Neuropathy: Methods: Explain/Verbal Responses: State content correctly Welcome To [facility]: Methods: Explain/Verbal Responses: State content  correctly Wound/Skin Impairment: Methods: Explain/Verbal Responses: State content correctly Electronic Signature(s) Signed: 09/05/2016 4:40:28 PM By: Regan Lemming  BSN, RN [place] [place] ([id number]) Entered By: Regan Lemming on 09/05/2016 11:25:42 Shannon [name] ([id number]) -------------------------------------------------------------------------------- Wound Assessment Details Patient Name: Shannon, [name] Date of Service: 09/05/2016 10:15 AM Medical Record Patient Account Number: [id number] Number: Treating RN: Baruch Gouty, RN, BSN, Rita [birth year] [phone] y.o. Other Clinician: Date of Birth/Sex: Female) Treating ROBSON, MICHAEL Primary Care Shima Compere: Eulas Post, MONICA Khing Belcher/Extender: G Referring Yaacov Koziol: Eulas Post, MONICA Weeks in Treatment: 4 Wound Status Wound Number: 1 Primary Diabetic Wound/Ulcer of the Lower Etiology: Extremity Wound Location: Right Toe Great - Medial Wound Open Wounding Event: Gradually Appeared Status: Date Acquired: 07/18/2016 Comorbid Cataracts, Anemia, Asthma, Chronic Weeks Of Treatment: 4 History: Obstructive Pulmonary Disease (COPD), Clustered Wound: No Arrhythmia, Congestive Heart Failure, Coronary Artery Disease, Deep Vein Thrombosis, Hypertension, Type II Diabetes, Osteoarthritis, Osteomyelitis, Neuropathy Photos Photo Uploaded By: Regan Lemming on 09/05/2016 12:03:50 Wound Measurements Length: (cm) 0.2 Width: (cm) 0.2 Depth: (cm) 0.1 Area: (cm) 0.031 Volume: (cm) 0.003 % Reduction in Area: 86.9% % Reduction in Volume: 87.5% Epithelialization: Small (1-33%) Tunneling: No Undermining: No Wound Description Classification: Grade 1 Foul Odor Aft Wound Margin: Distinct, outline attached Slough/Fibrin Exudate Amount: Small Exudate Type: Serosanguineous Exudate Color: red, brown EVERLEY, EVORA ([id number]) er Cleansing: No o Yes Wound Bed Granulation Amount: Large (67-100%) Exposed Structure Granulation Quality: Friable Fascia Exposed:  No Necrotic Amount: None Present (0%) Fat Layer (Subcutaneous Tissue) Exposed: No Tendon Exposed: No Muscle Exposed: No Joint Exposed: No Bone Exposed: No Limited to Skin Breakdown Periwound Skin Texture Texture Color No Abnormalities Noted: No No Abnormalities Noted: No Callus: Yes Atrophie Blanche: No Crepitus: No Cyanosis: No Excoriation: No Ecchymosis: No Induration: No Erythema: No Rash: No Hemosiderin Staining: Yes Scarring: No Mottled: No Pallor: No Moisture Rubor: No No Abnormalities Noted: No Dry / Scaly: Yes Temperature / Pain Maceration: No Temperature: No Abnormality Wound Preparation Ulcer Cleansing: Rinsed/Irrigated with Saline Topical Anesthetic Applied: None Electronic Signature(s) Signed: 09/05/2016 10:53:13 AM By: Regan Lemming BSN, RN Entered By: Regan Lemming on 09/05/2016 10:53:12 Shannon [name] ([id number]) -------------------------------------------------------------------------------- Wound Assessment Details Patient Name: Shannon [name] Date of Service: 09/05/2016 10:15 AM Medical Record Patient Account Number: [id number] Number: Treating RN: Baruch Gouty, RN, BSN, Rita 1923-12-13 (83 y.o. Other Clinician: Date of Birth/Sex: Female) Treating ROBSON, MICHAEL Primary Care Jacori Mulrooney: Eulas Post, MONICA Lupie Sawa/Extender: G Referring Haylen Shelnutt: Eulas Post, MONICA Weeks in Treatment: 4 Wound Status Wound Number: 2 Primary Diabetic Wound/Ulcer of the Lower Etiology: Extremity Wound Location: Left Toe Second - Circumfernential Wound Open Status: Wounding Event: Gradually Appeared Comorbid Cataracts, Anemia, Asthma, Chronic Date Acquired: 07/11/2016 History: Obstructive Pulmonary Disease (COPD), Weeks Of Treatment: 4 Arrhythmia, Congestive Heart Failure, Clustered Wound: No Coronary Artery Disease, Deep Vein Pending Amputation On Presentation Thrombosis, Hypertension, Type II Diabetes, Osteoarthritis, Osteomyelitis, Neuropathy Photos Photo Uploaded  By: Regan Lemming on 09/05/2016 12:03:51 Wound Measurements Length: (cm) 8.2 Width: (cm) 2 Depth: (cm) 0.1 Area: (cm) 12.881 Volume: (cm) 1.288 Heitzenrater, Sheniah ([id number]) % Reduction in Area: 27.1% % Reduction in Volume: 27.1% Epithelialization: None Tunneling: No Undermining: No Wound Description Classification: Unable to visualize wound bed Wound Margin: Indistinct, nonvisible Exudate Amount: Medium Exudate Type: Serosanguineous Exudate Color: red, brown Foul Odor After Cleansing: No Slough/Fibrino Yes Wound Bed Granulation Amount: None Present (0%) Exposed Structure Necrotic Amount: Large (67-100%) Fascia Exposed: No Necrotic Quality: Eschar, Adherent Slough Fat Layer (Subcutaneous Tissue) Exposed: Yes Tendon Exposed: No Muscle Exposed: No Joint Exposed: No Bone Exposed: No Periwound Skin Texture Texture Color No Abnormalities Noted: No No Abnormalities Noted: No Callus: No Atrophie Blanche: No Crepitus: No  N/A % Reduction in Volume: N/A N/A N/A Classification: Grade 2 N/A N/A Exudate Amount: Medium N/A N/A Exudate Type: Serosanguineous N/A N/A Exudate Color: red, brown N/A N/A Wound Margin: Distinct, outline attached N/A N/A Granulation Amount: None Present (0%) N/A N/A Granulation Quality: N/A N/A N/A Necrotic Amount: Large (67-100%) N/A N/A Necrotic Tissue: Adherent Slough N/A N/A Exposed Structures: Fat Layer (Subcutaneous N/A N/A Tissue) Exposed: Yes Fascia: No Tendon: No Muscle: No Joint: No Bone: No Epithelialization: None N/A N/A Periwound Skin Texture: Excoriation: No N/A  N/A Induration: No Callus: No Crepitus: No Rash: No Scarring: No Periwound Skin Maceration: Yes N/A N/A Moisture: Dry/Scaly: No DARIELLA, GILLIHAN ([id number]) Periwound Skin Color: Atrophie Blanche: No N/A N/A Cyanosis: No Ecchymosis: No Erythema: No Hemosiderin Staining: No Mottled: No Pallor: No Rubor: No Temperature: No Abnormality N/A N/A Tenderness on Yes N/A N/A Palpation: Wound Preparation: Ulcer Cleansing: N/A N/A Rinsed/Irrigated with Saline Topical Anesthetic Applied: Other: Lidocaine 4% Treatment Notes Electronic Signature(s) Signed: 09/05/2016 5:27:39 PM By: Linton Ham MD Previous Signature: 09/05/2016 10:56:21 AM Version By: Regan Lemming BSN, RN Entered By: Linton Ham on 09/05/2016 11:10:19 Shannon [name] ([id number]) -------------------------------------------------------------------------------- Multi-Disciplinary Care Plan Details Patient Name: Shannon [name] Date of Service: 09/05/2016 10:15 AM Medical Record Patient Account Number: [id number] Number: Treating RN: Baruch Gouty, RN, BSN, Rita 1923-04-13 [phone] y.o. Other Clinician: Date of Birth/Sex: Female) Treating ROBSON, MICHAEL Primary Care Mackenzie Groom: Eulas Post, MONICA Moises Terpstra/Extender: G Referring Promise Bushong: Gildardo Cranker Weeks in Treatment: 4 Active Inactive Electronic Signature(s) Signed: 09/27/2016 5:27:18 PM By: Gretta Cool, BSN, RN, CWS, Kim RN, BSN Signed: 09/28/2016 3:16:06 PM By: Regan Lemming BSN, RN Previous Signature: 09/05/2016 10:56:12 AM Version By: Regan Lemming BSN, RN Entered By: Gretta Cool, BSN, RN, CWS, Kim on 09/27/2016 17:27:18 Shannon [name] ([id number]) -------------------------------------------------------------------------------- Pain Assessment Details Patient Name: Shannon [name] Date of Service: 09/05/2016 10:15 AM Medical Record Patient Account Number: [id number] Number: Treating RN: Baruch Gouty, RN, BSN, Rita [birth year] (83 y.o. Other Clinician: Date of Birth/Sex: Female) Treating  ROBSON, MICHAEL Primary Care Vantasia Pinkney: Eulas Post, MONICA Kinta Martis/Extender: G Referring Damarion Mendizabal: Eulas Post MONICA Weeks in Treatment: 4 Active Problems Location of Pain Severity and Description of Pain Patient Has Paino Yes Site Locations Pain Location: Pain in Ulcers With Dressing Change: Yes Rate the pain. Current Pain Level: 3 Character of Pain Describe the Pain: Tender Pain Management and Medication Current Pain Management: How does your wound impact your activities of daily livingo Sleep: No Bathing: No Appetite: No Relationship With Others: No Bladder Continence: No Emotions: No Bowel Continence: No Work: No Toileting: No Drive: No Dressing: No Hobbies: No Electronic Signature(s) Signed: 09/05/2016 4:40:28 PM By: Regan Lemming BSN, RN Entered By: Regan Lemming on 09/05/2016 10:32:36 Shannon [name] ([id number]) -------------------------------------------------------------------------------- Patient/Caregiver Education Details Patient Name: Shannon [name] Date of Service: 09/05/2016 10:15 AM Medical Record Patient Account Number: [id number] Number: Treating RN: Baruch Gouty, RN, BSN, Rita 06-Jul-1923 (83 y.o. Other Clinician: Date of Birth/Gender: Female) Treating ROBSON, MICHAEL Primary Care Physician: Eulas Post, MONICA Physician/Extender: G Referring Physician: Sherron Monday in Treatment: 4 Education Assessment Education Provided To: Patient and Caregiver dtr Education Topics Provided Elevated Blood Sugar/ Impact on Healing: Methods: Explain/Verbal Responses: State content correctly Infection: Methods: Explain/Verbal Responses: State content correctly Nutrition: Methods: Explain/Verbal Responses: State content correctly Pain: Methods: Explain/Verbal Responses: State content correctly Peripheral Neuropathy: Methods: Explain/Verbal Responses: State content correctly Welcome To [facility]: Methods: Explain/Verbal Responses: State content  correctly Wound/Skin Impairment: Methods: Explain/Verbal Responses: State content correctly Electronic Signature(s) Signed: 09/05/2016 4:40:28 PM By: Regan Lemming  N/A % Reduction in Volume: N/A N/A N/A Classification: Grade 2 N/A N/A Exudate Amount: Medium N/A N/A Exudate Type: Serosanguineous N/A N/A Exudate Color: red, brown N/A N/A Wound Margin: Distinct, outline attached N/A N/A Granulation Amount: None Present (0%) N/A N/A Granulation Quality: N/A N/A N/A Necrotic Amount: Large (67-100%) N/A N/A Necrotic Tissue: Adherent Slough N/A N/A Exposed Structures: Fat Layer (Subcutaneous N/A N/A Tissue) Exposed: Yes Fascia: No Tendon: No Muscle: No Joint: No Bone: No Epithelialization: None N/A N/A Periwound Skin Texture: Excoriation: No N/A  N/A Induration: No Callus: No Crepitus: No Rash: No Scarring: No Periwound Skin Maceration: Yes N/A N/A Moisture: Dry/Scaly: No DARIELLA, GILLIHAN ([id number]) Periwound Skin Color: Atrophie Blanche: No N/A N/A Cyanosis: No Ecchymosis: No Erythema: No Hemosiderin Staining: No Mottled: No Pallor: No Rubor: No Temperature: No Abnormality N/A N/A Tenderness on Yes N/A N/A Palpation: Wound Preparation: Ulcer Cleansing: N/A N/A Rinsed/Irrigated with Saline Topical Anesthetic Applied: Other: Lidocaine 4% Treatment Notes Electronic Signature(s) Signed: 09/05/2016 5:27:39 PM By: Linton Ham MD Previous Signature: 09/05/2016 10:56:21 AM Version By: Regan Lemming BSN, RN Entered By: Linton Ham on 09/05/2016 11:10:19 Shannon [name] ([id number]) -------------------------------------------------------------------------------- Multi-Disciplinary Care Plan Details Patient Name: Shannon [name] Date of Service: 09/05/2016 10:15 AM Medical Record Patient Account Number: [id number] Number: Treating RN: Baruch Gouty, RN, BSN, Rita 1923-04-13 [phone] y.o. Other Clinician: Date of Birth/Sex: Female) Treating ROBSON, MICHAEL Primary Care Mackenzie Groom: Eulas Post, MONICA Moises Terpstra/Extender: G Referring Promise Bushong: Gildardo Cranker Weeks in Treatment: 4 Active Inactive Electronic Signature(s) Signed: 09/27/2016 5:27:18 PM By: Gretta Cool, BSN, RN, CWS, Kim RN, BSN Signed: 09/28/2016 3:16:06 PM By: Regan Lemming BSN, RN Previous Signature: 09/05/2016 10:56:12 AM Version By: Regan Lemming BSN, RN Entered By: Gretta Cool, BSN, RN, CWS, Kim on 09/27/2016 17:27:18 Shannon [name] ([id number]) -------------------------------------------------------------------------------- Pain Assessment Details Patient Name: Shannon [name] Date of Service: 09/05/2016 10:15 AM Medical Record Patient Account Number: [id number] Number: Treating RN: Baruch Gouty, RN, BSN, Rita [birth year] (83 y.o. Other Clinician: Date of Birth/Sex: Female) Treating  ROBSON, MICHAEL Primary Care Vantasia Pinkney: Eulas Post, MONICA Kinta Martis/Extender: G Referring Damarion Mendizabal: Eulas Post MONICA Weeks in Treatment: 4 Active Problems Location of Pain Severity and Description of Pain Patient Has Paino Yes Site Locations Pain Location: Pain in Ulcers With Dressing Change: Yes Rate the pain. Current Pain Level: 3 Character of Pain Describe the Pain: Tender Pain Management and Medication Current Pain Management: How does your wound impact your activities of daily livingo Sleep: No Bathing: No Appetite: No Relationship With Others: No Bladder Continence: No Emotions: No Bowel Continence: No Work: No Toileting: No Drive: No Dressing: No Hobbies: No Electronic Signature(s) Signed: 09/05/2016 4:40:28 PM By: Regan Lemming BSN, RN Entered By: Regan Lemming on 09/05/2016 10:32:36 Shannon [name] ([id number]) -------------------------------------------------------------------------------- Patient/Caregiver Education Details Patient Name: Shannon [name] Date of Service: 09/05/2016 10:15 AM Medical Record Patient Account Number: [id number] Number: Treating RN: Baruch Gouty, RN, BSN, Rita 06-Jul-1923 (83 y.o. Other Clinician: Date of Birth/Gender: Female) Treating ROBSON, MICHAEL Primary Care Physician: Eulas Post, MONICA Physician/Extender: G Referring Physician: Sherron Monday in Treatment: 4 Education Assessment Education Provided To: Patient and Caregiver dtr Education Topics Provided Elevated Blood Sugar/ Impact on Healing: Methods: Explain/Verbal Responses: State content correctly Infection: Methods: Explain/Verbal Responses: State content correctly Nutrition: Methods: Explain/Verbal Responses: State content correctly Pain: Methods: Explain/Verbal Responses: State content correctly Peripheral Neuropathy: Methods: Explain/Verbal Responses: State content correctly Welcome To [facility]: Methods: Explain/Verbal Responses: State content  correctly Wound/Skin Impairment: Methods: Explain/Verbal Responses: State content correctly Electronic Signature(s) Signed: 09/05/2016 4:40:28 PM By: Regan Lemming  Simple Wound Cleansing - one wound 0 X - Complex Wound Cleansing - multiple wounds 4 5 X - Wound Imaging (photographs - any number of wounds) 1 5 []  - Wound Tracing (instead of photographs) 0 []  - Simple Wound Measurement - one wound 0 X - Complex Wound Measurement - multiple wounds 4 5 INTERVENTIONS - Wound Dressings X - Small Wound Dressing one or multiple wounds 4 10 []  - Medium Wound Dressing one or multiple wounds 0 []  - Large Wound Dressing one or multiple wounds 0 []  - Application of Medications - topical 0 []  - Application of Medications - injection 0 WAVIE, HASHIMI ([id number]) INTERVENTIONS - Miscellaneous []  - External ear exam 0 []  - Specimen Collection (cultures, biopsies, blood, body fluids, etc.) 0 []  - Specimen(s) / Culture(s) sent or taken to Lab for analysis 0 []  - Patient Transfer (multiple staff / [employer] fast streamer / Similar devices) 0 []  - Simple Staple / Suture removal (25 or less) 0 []  - Complex Staple / Suture removal (26 or more) 0 []  - Hypo / Hyperglycemic Management (close monitor of Blood Glucose) 0 []  - Ankle / Brachial Index (ABI) - do not check if billed separately 0 X - Vital Signs 1 5 Has the patient been seen at the hospital within the last three years: Yes Total Score: 155 Level Of Care: New/Established - Level 4 Electronic Signature(s) Signed: 09/05/2016 4:40:28 PM By: Regan Lemming BSN, RN Entered By: Regan Lemming on 09/05/2016 11:04:58 Shannon [name] ([id number]) -------------------------------------------------------------------------------- Encounter Discharge Information Details Patient Name: Shannon [name] Date of Service: 09/05/2016 10:15 AM Medical Record Patient Account Number: [id number] Number: Treating RN: Baruch Gouty, RN, BSN, Rita 31-Oct-1923 (83 y.o. Other  Clinician: Date of Birth/Sex: Female) Treating ROBSON, MICHAEL Primary Care Andersen Iorio: Eulas Post, MONICA Illa Enlow/Extender: G Referring Teiara Baria: Gildardo Cranker Weeks in Treatment: 4 Encounter Discharge Information Items Discharge Pain Level: 0 Discharge Condition: Stable Ambulatory Status: Walker Discharge Destination: Home Transportation: Private Auto Accompanied By: dtr Schedule Follow-up Appointment: No Medication Reconciliation completed No and provided to Patient/Care Saydi Kobel: Provided on Clinical Summary of Care: 09/05/2016 Form Type Recipient Paper Patient LS Electronic Signature(s) Signed: 09/05/2016 11:25:08 AM By: Sharon Mt Entered By: Sharon Mt on 09/05/2016 11:25:07 Shannon [name] ([id number]) -------------------------------------------------------------------------------- Lower Extremity Assessment Details Patient Name: Shannon [name] Date of Service: 09/05/2016 10:15 AM Medical Record Patient Account Number: [id number] Number: Treating RN: Baruch Gouty, RN, BSN, Rita September 11, 1923 (83 y.o. Other Clinician: Date of Birth/Sex: Female) Treating ROBSON, MICHAEL Primary Care Faiza Bansal: Eulas Post, MONICA Oaklynn Stierwalt/Extender: G Referring Abryanna Musolino: Eulas Post MONICA Weeks in Treatment: 4 Vascular Assessment Claudication: Claudication Assessment [Left:None] Pulses: Dorsalis Pedis Palpable: [Left:No] Doppler Audible: [Left:Yes] [Right:Yes] Posterior Tibial Extremity colors, hair growth, and conditions: Extremity Color: [Left:Hyperpigmented] [Right:Hyperpigmented] Hair Growth on Extremity: [Left:No] [Right:No] Temperature of Extremity: [Left:Warm] [Right:Warm] Capillary Refill: [Left:< 3 seconds] [Right:< 3 seconds] Toe Nail Assessment Left: Right: Thick: Yes Yes Discolored: Yes Yes Deformed: Yes Yes Improper Length and Hygiene: Yes Yes Electronic Signature(s) Signed: 09/05/2016 4:40:28 PM By: Regan Lemming BSN, RN Entered By: Regan Lemming on 09/05/2016 10:47:47 Shannon [name]  ([id number]) -------------------------------------------------------------------------------- Multi Wound Chart Details Patient Name: Shannon [name] Date of Service: 09/05/2016 10:15 AM Medical Record Patient Account Number: [id number] Number: Treating RN: Baruch Gouty, RN, BSN, Rita [birth year] (83 y.o. Other Clinician: Date of Birth/Sex: Female) Treating ROBSON, MICHAEL Primary Care Emory Leaver: Eulas Post, MONICA Dezerae Freiberger/Extender: G Referring Braley Luckenbaugh: Eulas Post, MONICA Weeks in Treatment: 4 Vital Signs Height(in): 66 Pulse(bpm): 72 Weight(lbs): 147 Blood Pressure 148/66 (mmHg): Body Mass Index(BMI): 24 Temperature(F): 98.1 Respiratory  Simple Wound Cleansing - one wound 0 X - Complex Wound Cleansing - multiple wounds 4 5 X - Wound Imaging (photographs - any number of wounds) 1 5 []  - Wound Tracing (instead of photographs) 0 []  - Simple Wound Measurement - one wound 0 X - Complex Wound Measurement - multiple wounds 4 5 INTERVENTIONS - Wound Dressings X - Small Wound Dressing one or multiple wounds 4 10 []  - Medium Wound Dressing one or multiple wounds 0 []  - Large Wound Dressing one or multiple wounds 0 []  - Application of Medications - topical 0 []  - Application of Medications - injection 0 WAVIE, HASHIMI ([id number]) INTERVENTIONS - Miscellaneous []  - External ear exam 0 []  - Specimen Collection (cultures, biopsies, blood, body fluids, etc.) 0 []  - Specimen(s) / Culture(s) sent or taken to Lab for analysis 0 []  - Patient Transfer (multiple staff / [employer] fast streamer / Similar devices) 0 []  - Simple Staple / Suture removal (25 or less) 0 []  - Complex Staple / Suture removal (26 or more) 0 []  - Hypo / Hyperglycemic Management (close monitor of Blood Glucose) 0 []  - Ankle / Brachial Index (ABI) - do not check if billed separately 0 X - Vital Signs 1 5 Has the patient been seen at the hospital within the last three years: Yes Total Score: 155 Level Of Care: New/Established - Level 4 Electronic Signature(s) Signed: 09/05/2016 4:40:28 PM By: Regan Lemming BSN, RN Entered By: Regan Lemming on 09/05/2016 11:04:58 Shannon [name] ([id number]) -------------------------------------------------------------------------------- Encounter Discharge Information Details Patient Name: Shannon [name] Date of Service: 09/05/2016 10:15 AM Medical Record Patient Account Number: [id number] Number: Treating RN: Baruch Gouty, RN, BSN, Rita 31-Oct-1923 (83 y.o. Other  Clinician: Date of Birth/Sex: Female) Treating ROBSON, MICHAEL Primary Care Andersen Iorio: Eulas Post, MONICA Illa Enlow/Extender: G Referring Teiara Baria: Gildardo Cranker Weeks in Treatment: 4 Encounter Discharge Information Items Discharge Pain Level: 0 Discharge Condition: Stable Ambulatory Status: Walker Discharge Destination: Home Transportation: Private Auto Accompanied By: dtr Schedule Follow-up Appointment: No Medication Reconciliation completed No and provided to Patient/Care Saydi Kobel: Provided on Clinical Summary of Care: 09/05/2016 Form Type Recipient Paper Patient LS Electronic Signature(s) Signed: 09/05/2016 11:25:08 AM By: Sharon Mt Entered By: Sharon Mt on 09/05/2016 11:25:07 Shannon [name] ([id number]) -------------------------------------------------------------------------------- Lower Extremity Assessment Details Patient Name: Shannon [name] Date of Service: 09/05/2016 10:15 AM Medical Record Patient Account Number: [id number] Number: Treating RN: Baruch Gouty, RN, BSN, Rita September 11, 1923 (83 y.o. Other Clinician: Date of Birth/Sex: Female) Treating ROBSON, MICHAEL Primary Care Faiza Bansal: Eulas Post, MONICA Oaklynn Stierwalt/Extender: G Referring Abryanna Musolino: Eulas Post MONICA Weeks in Treatment: 4 Vascular Assessment Claudication: Claudication Assessment [Left:None] Pulses: Dorsalis Pedis Palpable: [Left:No] Doppler Audible: [Left:Yes] [Right:Yes] Posterior Tibial Extremity colors, hair growth, and conditions: Extremity Color: [Left:Hyperpigmented] [Right:Hyperpigmented] Hair Growth on Extremity: [Left:No] [Right:No] Temperature of Extremity: [Left:Warm] [Right:Warm] Capillary Refill: [Left:< 3 seconds] [Right:< 3 seconds] Toe Nail Assessment Left: Right: Thick: Yes Yes Discolored: Yes Yes Deformed: Yes Yes Improper Length and Hygiene: Yes Yes Electronic Signature(s) Signed: 09/05/2016 4:40:28 PM By: Regan Lemming BSN, RN Entered By: Regan Lemming on 09/05/2016 10:47:47 Shannon [name]  ([id number]) -------------------------------------------------------------------------------- Multi Wound Chart Details Patient Name: Shannon [name] Date of Service: 09/05/2016 10:15 AM Medical Record Patient Account Number: [id number] Number: Treating RN: Baruch Gouty, RN, BSN, Rita [birth year] (83 y.o. Other Clinician: Date of Birth/Sex: Female) Treating ROBSON, MICHAEL Primary Care Emory Leaver: Eulas Post, MONICA Dezerae Freiberger/Extender: G Referring Braley Luckenbaugh: Eulas Post, MONICA Weeks in Treatment: 4 Vital Signs Height(in): 66 Pulse(bpm): 72 Weight(lbs): 147 Blood Pressure 148/66 (mmHg): Body Mass Index(BMI): 24 Temperature(F): 98.1 Respiratory  N/A % Reduction in Volume: N/A N/A N/A Classification: Grade 2 N/A N/A Exudate Amount: Medium N/A N/A Exudate Type: Serosanguineous N/A N/A Exudate Color: red, brown N/A N/A Wound Margin: Distinct, outline attached N/A N/A Granulation Amount: None Present (0%) N/A N/A Granulation Quality: N/A N/A N/A Necrotic Amount: Large (67-100%) N/A N/A Necrotic Tissue: Adherent Slough N/A N/A Exposed Structures: Fat Layer (Subcutaneous N/A N/A Tissue) Exposed: Yes Fascia: No Tendon: No Muscle: No Joint: No Bone: No Epithelialization: None N/A N/A Periwound Skin Texture: Excoriation: No N/A  N/A Induration: No Callus: No Crepitus: No Rash: No Scarring: No Periwound Skin Maceration: Yes N/A N/A Moisture: Dry/Scaly: No DARIELLA, GILLIHAN ([id number]) Periwound Skin Color: Atrophie Blanche: No N/A N/A Cyanosis: No Ecchymosis: No Erythema: No Hemosiderin Staining: No Mottled: No Pallor: No Rubor: No Temperature: No Abnormality N/A N/A Tenderness on Yes N/A N/A Palpation: Wound Preparation: Ulcer Cleansing: N/A N/A Rinsed/Irrigated with Saline Topical Anesthetic Applied: Other: Lidocaine 4% Treatment Notes Electronic Signature(s) Signed: 09/05/2016 5:27:39 PM By: Linton Ham MD Previous Signature: 09/05/2016 10:56:21 AM Version By: Regan Lemming BSN, RN Entered By: Linton Ham on 09/05/2016 11:10:19 Shannon [name] ([id number]) -------------------------------------------------------------------------------- Multi-Disciplinary Care Plan Details Patient Name: Shannon [name] Date of Service: 09/05/2016 10:15 AM Medical Record Patient Account Number: [id number] Number: Treating RN: Baruch Gouty, RN, BSN, Rita [id number] y.o. Other Clinician: Date of Birth/Sex: Female) Treating ROBSON, MICHAEL Primary Care Mackenzie Groom: Eulas Post, MONICA Moises Terpstra/Extender: G Referring Promise Bushong: Gildardo Cranker Weeks in Treatment: 4 Active Inactive Electronic Signature(s) Signed: 09/27/2016 5:27:18 PM By: Gretta Cool, BSN, RN, CWS, Kim RN, BSN Signed: 09/28/2016 3:16:06 PM By: Regan Lemming BSN, RN Previous Signature: 09/05/2016 10:56:12 AM Version By: Regan Lemming BSN, RN Entered By: Gretta Cool, BSN, RN, CWS, Kim on 09/27/2016 17:27:18 Shannon [name] ([id number]) -------------------------------------------------------------------------------- Pain Assessment Details Patient Name: Shannon [name] Date of Service: 09/05/2016 10:15 AM Medical Record Patient Account Number: [id number] Number: Treating RN: Baruch Gouty, RN, BSN, Rita [birth year] (83 y.o. Other Clinician: Date of Birth/Sex: Female) Treating  ROBSON, MICHAEL Primary Care Vantasia Pinkney: Eulas Post, MONICA Kinta Martis/Extender: G Referring Damarion Mendizabal: Eulas Post MONICA Weeks in Treatment: 4 Active Problems Location of Pain Severity and Description of Pain Patient Has Paino Yes Site Locations Pain Location: Pain in Ulcers With Dressing Change: Yes Rate the pain. Current Pain Level: 3 Character of Pain Describe the Pain: Tender Pain Management and Medication Current Pain Management: How does your wound impact your activities of daily livingo Sleep: No Bathing: No Appetite: No Relationship With Others: No Bladder Continence: No Emotions: No Bowel Continence: No Work: No Toileting: No Drive: No Dressing: No Hobbies: No Electronic Signature(s) Signed: 09/05/2016 4:40:28 PM By: Regan Lemming BSN, RN Entered By: Regan Lemming on 09/05/2016 10:32:36 Shannon [name] ([id number]) -------------------------------------------------------------------------------- Patient/Caregiver Education Details Patient Name: Shannon [name] Date of Service: 09/05/2016 10:15 AM Medical Record Patient Account Number: [id number] Number: Treating RN: Baruch Gouty, RN, BSN, Rita 06-Jul-1923 (83 y.o. Other Clinician: Date of Birth/Gender: Female) Treating ROBSON, MICHAEL Primary Care Physician: Eulas Post, MONICA Physician/Extender: G Referring Physician: Sherron Monday in Treatment: 4 Education Assessment Education Provided To: Patient and Caregiver dtr Education Topics Provided Elevated Blood Sugar/ Impact on Healing: Methods: Explain/Verbal Responses: State content correctly Infection: Methods: Explain/Verbal Responses: State content correctly Nutrition: Methods: Explain/Verbal Responses: State content correctly Pain: Methods: Explain/Verbal Responses: State content correctly Peripheral Neuropathy: Methods: Explain/Verbal Responses: State content correctly Welcome To [facility]: Methods: Explain/Verbal Responses: State content  correctly Wound/Skin Impairment: Methods: Explain/Verbal Responses: State content correctly Electronic Signature(s) Signed: 09/05/2016 4:40:28 PM By: Regan Lemming

## 2016-09-06 NOTE — Progress Notes (Addendum)
and are infrequent or of short duration when for other reasons). If current dressing causes regression in wound condition, may D/C ordered dressing product/s and apply Normal Saline Moist Dressing daily until next [place] / Other MD appointment. [place] of regression in wound condition at [phone]. ARELY, TINNER ([id number]) Please direct any NON-WOUND related issues/requests for orders to patient's Primary Care Physician #1 necrotic ischemic left forefoot involving her first second and third toes. She has mummification of the second toe. #2 she did see Dr. Donzetta Matters [facility] before she was hospitalized but did not agree to any further arterial studies. Her noninvasive studies showed monophasic waveforms in the popliteal posterior tibial and anterior tibial artery #3 she did not agree to consideration of an arteriogram or amputation in the  hospital although I think she is beginning to come around to the idea of the amputation. I don't think that she could have anything less than a BKA unless they could revascularize her. #4 I reviewed her cardiologist's notes from yesterday she was felt to have acute on chronic diastolic heart failure stage IV. Mild adjustments were made in her Lasix. Echocardiogram showed normal systolic function in October #5 if the patient is ready to proceed I think she should go back to vein and vascular and discuss either an attempted revascularization or at least an arteriogram versus below-knee amputation. For now we will continue with palliative dressings was silver alginate. She is not in a lot of pain probably because of diabetic neuropathy. She is not systemically unwell Electronic Signature(s) Signed: 09/08/2016 9:15:07 AM By: Gretta Cool, BSN, RN, CWS, Kim RN, BSN Signed: 09/08/2016 5:55:32 PM By: Linton Ham MD Previous Signature: 09/05/2016 5:27:39 PM Version By: Linton Ham MD Entered By: Gretta Cool, BSN, RN, CWS, Kim on 09/08/2016 09:15:07 Shannon [name] ([id number]) -------------------------------------------------------------------------------- [place] Details Patient Name: Shannon [name] Date of Service: 09/05/2016 Medical Record Patient Account Number: [id number] Number: Treating RN: Baruch Gouty, RN, BSN, Rita 11-15-1923 [phone] y.o. Other Clinician: Date of Birth/Sex: Female) Treating ROBSON, MICHAEL Primary Care Provider: Eulas Post, MONICA Provider/Extender: G Referring Provider: Gildardo Cranker Weeks in Treatment: 4 Diagnosis Coding ICD-10 Codes Code Description E11.52 Type 2 diabetes mellitus with diabetic peripheral angiopathy with gangrene L97.523 Non-pressure chronic ulcer of other part of left foot with necrosis of muscle L97.511 Non-pressure chronic ulcer of other part of right foot limited to breakdown of skin Facility Procedures CPT4 Code: [id number] Description: 99214 - WOUND CARE  VISIT-LEV 4 EST PT Modifier: Quantity: 1 Physician Procedures CPT4 Code Description: 2423536 14431 - WC PHYS LEVEL 4 - EST PT ICD-10 Description Diagnosis E11.52 Type 2 diabetes mellitus with diabetic peripheral an L97.523 Non-pressure chronic ulcer of other part of left foo Modifier: giopathy with g t with necrosis Quantity: 1 angrene of muscle Electronic Signature(s) Signed: 09/05/2016 5:27:39 PM By: Linton Ham MD Entered By: Linton Ham on 09/05/2016 11:23:12  Temperature is normal and within the target range for the patient.[name] appears in no distress. Somewhat anxious. Eyes Conjunctivae clear. No discharge. Respiratory Respiratory effort is easy and symmetric bilaterally. Rate is normal at rest and on room air.. Mild end expiratory wheezing. Cardiovascular Heart rhythm and rate regular, without murmur or gallop.. Pedal pulses absent bilaterally.. Lymphatic None palpable in the left popliteal erring on all area. Integumentary (Hair, Skin) The skin in her left foot looks somewhat dusky although not cold. Psychiatric No evidence of depression, anxiety, or agitation. Calm, cooperative, and communicative. Appropriate interactions and affect.. Notes Exam; continued gradual deterioration in this area from 2 weeks ago. Mummified left second toe necrosis of the medial aspect of the third toe base of the second toe and in the webspace between the first and second toes. There is no overt infection. There is no palpable pulse in the left foot. The forefoot itself looks dusky although it is not particularly cold Electronic Signature(s) Signed: 09/05/2016 5:27:39 PM By: Linton Ham MD Entered By: Linton Ham on 09/05/2016 11:17:55 Shannon [name] ([id number]) -------------------------------------------------------------------------------- Physician Orders Details Patient Name: Shannon [name] Date of Service: 09/05/2016 10:15 AM Medical Record Patient Account Number: [id number] Number: Treating RN: Baruch Gouty, RN, BSN, Rita 03-17-1924 [phone] y.o. Other Clinician: Date of Birth/Sex: Female) Treating ROBSON, MICHAEL Primary Care Provider: Eulas Post, MONICA Provider/Extender: G Referring Provider: Gildardo Cranker Weeks in Treatment: 4 Verbal / Phone Orders:  No Diagnosis Coding Wound Cleansing Wound #1 Right,Medial Toe Great o Clean wound with Normal Saline. Wound #2 Left,Circumferential Toe Second o Clean wound with Normal Saline. Wound #3 Left,Medial Toe Great o Clean wound with Normal Saline. Anesthetic Wound #1 Right,Medial Toe Great o Topical Lidocaine 4% cream applied to wound bed prior to debridement Wound #2 Left,Circumferential Toe Second o Topical Lidocaine 4% cream applied to wound bed prior to debridement Wound #3 Left,Medial Toe Great o Topical Lidocaine 4% cream applied to wound bed prior to debridement Primary Wound Dressing Wound #1 Right,Medial Toe Great o Aquacel Ag Wound #2 Left,Circumferential Toe Second o Aquacel Ag Wound #3 Left,Medial Toe Great o Aquacel Ag Secondary Dressing Wound #1 Right,Medial Toe Great o Gauze and Kerlix/Conform Wound #2 Left,Circumferential Toe Second [place], Anniece ([id number]) o Gauze and Kerlix/Conform Wound #3 Left,Medial Toe Great o Gauze and Kerlix/Conform Dressing Change Frequency Wound #1 Right,Medial Toe Great o Change dressing every other day. Wound #2 Left,Circumferential Toe Second o Change dressing every other day. Wound #3 Left,Medial Toe Great o Change dressing every other day. Follow-up Appointments Wound #1 Right,Medial Toe Great o Return Appointment in 2 weeks. Wound #2 Left,Circumferential Toe Second o Return Appointment in 2 weeks. Wound #3 Left,Medial Toe Great o Return Appointment in 2 weeks. Additional Orders / Instructions Wound #1 Right,Medial Toe Great o Increase protein intake. o Activity as tolerated Wound #2 Left,Circumferential Toe Second o Increase protein intake. o Activity as tolerated Wound #3 Left,Medial Toe Great o Increase protein intake. o Activity as tolerated Home Health Wound #1 [place] for [place] Visits o Home Health Nurse may visit PRN to address patientos wound care needs. o FACE TO FACE ENCOUNTER: MEDICARE and MEDICAID PATIENTS: I certify that this patient is under my care and that I had a face-to-face encounter that meets the physician face-to-face encounter requirements with this patient on this date. The encounter with the patient was in whole or in part for the following MEDICAL CONDITION: (primary reason  Shannon [name], Shannon [name] ([id number]) absences are for medical reasons / religious services and are infrequent or of short duration when for other reasons). o If current dressing causes regression in wound condition, may D/C ordered dressing product/s and apply Normal Saline Moist Dressing daily until next [place] / Other MD appointment. [place] of regression in wound condition at [phone]. o Please direct any NON-WOUND related issues/requests for orders to patient's Primary Care Physician Electronic Signature(s) Signed: 09/05/2016 4:40:28 PM By: Regan Lemming BSN, RN Signed: 09/05/2016 5:27:39 PM By: Linton Ham MD Entered By: Regan Lemming on 09/05/2016 16:32:08 Shannon [name] ([id number]) -------------------------------------------------------------------------------- Problem List Details Patient Name: Shannon [name] Date of Service: 09/05/2016 10:15 AM Medical Record Patient Account Number: [id number] Number: Treating RN: Baruch Gouty, RN, BSN, Rita 18-Nov-1923 [phone] y.o. 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 Other Clinician: Date of Birth/Sex: Female) Treating ROBSON, MICHAEL Primary Care Provider: Eulas Post, MONICA Provider/Extender: G Referring Provider: Gildardo Cranker Weeks in Treatment: 4 Subjective Chief Complaint Information obtained from Patient 08/08/16; patient is here for review of wound of the left 2nd toe, left first toe and a small area of the right first toe associated with severe PAD History of Present Illness (HPI) 08/08/16; this is a 83 year old women who arrives in our clinic referred from her podiatrist Dr. Amalia Hailey. She is a type 2 diabetic with PAD who recently came to the attention of vein and vascular in [place]. She presented with a black toe on the left foot. She was admitted to hospital with this from 07/28/16-08/01/16. MRI of the foot suggested a tiny subcutaneous abscess at the base of the second proximal phalynx suggested osteomyelitis. She did not wish to consider amputation or angiography. She received vanc and cephapime in the hospital discharged on clindamycin for a month. She is on Coumadin. Stage 3 crf, Previous non invasive arterial studies showed diffuse plaque with circumferential calcified vessels. monophasic waveforms in the superficial femoral artery and below. Patient is in moderate pain. They are only using dial soaks. and gauze. 08/22/16 Patient returns to clinic with ischemic wounds on her  left 2nd,3rd, and first toes. She denies severe pain,fever or other systemic symptoms. Tells me she was in hospital for pneumonia. I will check these records. She states she received different antibiotics from the clindamycin she was on. she has been using silver alginate. [facility]. She again tells me she does not wish to have attempts at revascularization or amputation. She does not feel she wound survive the surgery. 09/05/16; the patient continues to have gradual deterioration in the condition of the left foot. She has mummification of the left second toe wound on the left third toe, necrotic tissue on the plantar aspect of the left  Shannon [name], Shannon [name] ([id number]) absences are for medical reasons / religious services and are infrequent or of short duration when for other reasons). o If current dressing causes regression in wound condition, may D/C ordered dressing product/s and apply Normal Saline Moist Dressing daily until next [place] / Other MD appointment. [place] of regression in wound condition at [phone]. o Please direct any NON-WOUND related issues/requests for orders to patient's Primary Care Physician Electronic Signature(s) Signed: 09/05/2016 4:40:28 PM By: Regan Lemming BSN, RN Signed: 09/05/2016 5:27:39 PM By: Linton Ham MD Entered By: Regan Lemming on 09/05/2016 16:32:08 Shannon [name] ([id number]) -------------------------------------------------------------------------------- Problem List Details Patient Name: Shannon [name] Date of Service: 09/05/2016 10:15 AM Medical Record Patient Account Number: [id number] Number: Treating RN: Baruch Gouty, RN, BSN, Rita 18-Nov-1923 [phone] y.o. 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 Other Clinician: Date of Birth/Sex: Female) Treating ROBSON, MICHAEL Primary Care Provider: Eulas Post, MONICA Provider/Extender: G Referring Provider: Gildardo Cranker Weeks in Treatment: 4 Subjective Chief Complaint Information obtained from Patient 08/08/16; patient is here for review of wound of the left 2nd toe, left first toe and a small area of the right first toe associated with severe PAD History of Present Illness (HPI) 08/08/16; this is a 83 year old women who arrives in our clinic referred from her podiatrist Dr. Amalia Hailey. She is a type 2 diabetic with PAD who recently came to the attention of vein and vascular in [place]. She presented with a black toe on the left foot. She was admitted to hospital with this from 07/28/16-08/01/16. MRI of the foot suggested a tiny subcutaneous abscess at the base of the second proximal phalynx suggested osteomyelitis. She did not wish to consider amputation or angiography. She received vanc and cephapime in the hospital discharged on clindamycin for a month. She is on Coumadin. Stage 3 crf, Previous non invasive arterial studies showed diffuse plaque with circumferential calcified vessels. monophasic waveforms in the superficial femoral artery and below. Patient is in moderate pain. They are only using dial soaks. and gauze. 08/22/16 Patient returns to clinic with ischemic wounds on her  left 2nd,3rd, and first toes. She denies severe pain,fever or other systemic symptoms. Tells me she was in hospital for pneumonia. I will check these records. She states she received different antibiotics from the clindamycin she was on. she has been using silver alginate. [facility]. She again tells me she does not wish to have attempts at revascularization or amputation. She does not feel she wound survive the surgery. 09/05/16; the patient continues to have gradual deterioration in the condition of the left foot. She has mummification of the left second toe wound on the left third toe, necrotic tissue on the plantar aspect of the left  Shannon [name], Shannon [name] ([id number]) absences are for medical reasons / religious services and are infrequent or of short duration when for other reasons). o If current dressing causes regression in wound condition, may D/C ordered dressing product/s and apply Normal Saline Moist Dressing daily until next [place] / Other MD appointment. [place] of regression in wound condition at [phone]. o Please direct any NON-WOUND related issues/requests for orders to patient's Primary Care Physician Electronic Signature(s) Signed: 09/05/2016 4:40:28 PM By: Regan Lemming BSN, RN Signed: 09/05/2016 5:27:39 PM By: Linton Ham MD Entered By: Regan Lemming on 09/05/2016 16:32:08 Shannon [name] ([id number]) -------------------------------------------------------------------------------- Problem List Details Patient Name: Shannon [name] Date of Service: 09/05/2016 10:15 AM Medical Record Patient Account Number: [id number] Number: Treating RN: Baruch Gouty, RN, BSN, Rita 18-Nov-1923 [phone] y.o. 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 Other Clinician: Date of Birth/Sex: Female) Treating ROBSON, MICHAEL Primary Care Provider: Eulas Post, MONICA Provider/Extender: G Referring Provider: Gildardo Cranker Weeks in Treatment: 4 Subjective Chief Complaint Information obtained from Patient 08/08/16; patient is here for review of wound of the left 2nd toe, left first toe and a small area of the right first toe associated with severe PAD History of Present Illness (HPI) 08/08/16; this is a 83 year old women who arrives in our clinic referred from her podiatrist Dr. Amalia Hailey. She is a type 2 diabetic with PAD who recently came to the attention of vein and vascular in [place]. She presented with a black toe on the left foot. She was admitted to hospital with this from 07/28/16-08/01/16. MRI of the foot suggested a tiny subcutaneous abscess at the base of the second proximal phalynx suggested osteomyelitis. She did not wish to consider amputation or angiography. She received vanc and cephapime in the hospital discharged on clindamycin for a month. She is on Coumadin. Stage 3 crf, Previous non invasive arterial studies showed diffuse plaque with circumferential calcified vessels. monophasic waveforms in the superficial femoral artery and below. Patient is in moderate pain. They are only using dial soaks. and gauze. 08/22/16 Patient returns to clinic with ischemic wounds on her  left 2nd,3rd, and first toes. She denies severe pain,fever or other systemic symptoms. Tells me she was in hospital for pneumonia. I will check these records. She states she received different antibiotics from the clindamycin she was on. she has been using silver alginate. [facility]. She again tells me she does not wish to have attempts at revascularization or amputation. She does not feel she wound survive the surgery. 09/05/16; the patient continues to have gradual deterioration in the condition of the left foot. She has mummification of the left second toe wound on the left third toe, necrotic tissue on the plantar aspect of the left  Temperature is normal and within the target range for the patient.[name] appears in no distress. Somewhat anxious. Eyes Conjunctivae clear. No discharge. Respiratory Respiratory effort is easy and symmetric bilaterally. Rate is normal at rest and on room air.. Mild end expiratory wheezing. Cardiovascular Heart rhythm and rate regular, without murmur or gallop.. Pedal pulses absent bilaterally.. Lymphatic None palpable in the left popliteal erring on all area. Integumentary (Hair, Skin) The skin in her left foot looks somewhat dusky although not cold. Psychiatric No evidence of depression, anxiety, or agitation. Calm, cooperative, and communicative. Appropriate interactions and affect.. Notes Exam; continued gradual deterioration in this area from 2 weeks ago. Mummified left second toe necrosis of the medial aspect of the third toe base of the second toe and in the webspace between the first and second toes. There is no overt infection. There is no palpable pulse in the left foot. The forefoot itself looks dusky although it is not particularly cold Electronic Signature(s) Signed: 09/05/2016 5:27:39 PM By: Linton Ham MD Entered By: Linton Ham on 09/05/2016 11:17:55 Shannon [name] ([id number]) -------------------------------------------------------------------------------- Physician Orders Details Patient Name: Shannon [name] Date of Service: 09/05/2016 10:15 AM Medical Record Patient Account Number: [id number] Number: Treating RN: Baruch Gouty, RN, BSN, Rita 03-17-1924 [phone] y.o. Other Clinician: Date of Birth/Sex: Female) Treating ROBSON, MICHAEL Primary Care Provider: Eulas Post, MONICA Provider/Extender: G Referring Provider: Gildardo Cranker Weeks in Treatment: 4 Verbal / Phone Orders:  No Diagnosis Coding Wound Cleansing Wound #1 Right,Medial Toe Great o Clean wound with Normal Saline. Wound #2 Left,Circumferential Toe Second o Clean wound with Normal Saline. Wound #3 Left,Medial Toe Great o Clean wound with Normal Saline. Anesthetic Wound #1 Right,Medial Toe Great o Topical Lidocaine 4% cream applied to wound bed prior to debridement Wound #2 Left,Circumferential Toe Second o Topical Lidocaine 4% cream applied to wound bed prior to debridement Wound #3 Left,Medial Toe Great o Topical Lidocaine 4% cream applied to wound bed prior to debridement Primary Wound Dressing Wound #1 Right,Medial Toe Great o Aquacel Ag Wound #2 Left,Circumferential Toe Second o Aquacel Ag Wound #3 Left,Medial Toe Great o Aquacel Ag Secondary Dressing Wound #1 Right,Medial Toe Great o Gauze and Kerlix/Conform Wound #2 Left,Circumferential Toe Second [place], Anniece ([id number]) o Gauze and Kerlix/Conform Wound #3 Left,Medial Toe Great o Gauze and Kerlix/Conform Dressing Change Frequency Wound #1 Right,Medial Toe Great o Change dressing every other day. Wound #2 Left,Circumferential Toe Second o Change dressing every other day. Wound #3 Left,Medial Toe Great o Change dressing every other day. Follow-up Appointments Wound #1 Right,Medial Toe Great o Return Appointment in 2 weeks. Wound #2 Left,Circumferential Toe Second o Return Appointment in 2 weeks. Wound #3 Left,Medial Toe Great o Return Appointment in 2 weeks. Additional Orders / Instructions Wound #1 Right,Medial Toe Great o Increase protein intake. o Activity as tolerated Wound #2 Left,Circumferential Toe Second o Increase protein intake. o Activity as tolerated Wound #3 Left,Medial Toe Great o Increase protein intake. o Activity as tolerated Home Health Wound #1 [place] for [place] Visits o Home Health Nurse may visit PRN to address patientos wound care needs. o FACE TO FACE ENCOUNTER: MEDICARE and MEDICAID PATIENTS: I certify that this patient is under my care and that I had a face-to-face encounter that meets the physician face-to-face encounter requirements with this patient on this date. The encounter with the patient was in whole or in part for the following MEDICAL CONDITION: (primary reason  Shannon [name], Shannon [name] ([id number]) absences are for medical reasons / religious services and are infrequent or of short duration when for other reasons). o If current dressing causes regression in wound condition, may D/C ordered dressing product/s and apply Normal Saline Moist Dressing daily until next [place] / Other MD appointment. [place] of regression in wound condition at [phone]. o Please direct any NON-WOUND related issues/requests for orders to patient's Primary Care Physician Electronic Signature(s) Signed: 09/05/2016 4:40:28 PM By: Regan Lemming BSN, RN Signed: 09/05/2016 5:27:39 PM By: Linton Ham MD Entered By: Regan Lemming on 09/05/2016 16:32:08 Shannon [name] ([id number]) -------------------------------------------------------------------------------- Problem List Details Patient Name: Shannon [name] Date of Service: 09/05/2016 10:15 AM Medical Record Patient Account Number: [id number] Number: Treating RN: Baruch Gouty, RN, BSN, Rita 18-Nov-1923 [phone] y.o. 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 Other Clinician: Date of Birth/Sex: Female) Treating ROBSON, MICHAEL Primary Care Provider: Eulas Post, MONICA Provider/Extender: G Referring Provider: Gildardo Cranker Weeks in Treatment: 4 Subjective Chief Complaint Information obtained from Patient 08/08/16; patient is here for review of wound of the left 2nd toe, left first toe and a small area of the right first toe associated with severe PAD History of Present Illness (HPI) 08/08/16; this is a 83 year old women who arrives in our clinic referred from her podiatrist Dr. Amalia Hailey. She is a type 2 diabetic with PAD who recently came to the attention of vein and vascular in [place]. She presented with a black toe on the left foot. She was admitted to hospital with this from 07/28/16-08/01/16. MRI of the foot suggested a tiny subcutaneous abscess at the base of the second proximal phalynx suggested osteomyelitis. She did not wish to consider amputation or angiography. She received vanc and cephapime in the hospital discharged on clindamycin for a month. She is on Coumadin. Stage 3 crf, Previous non invasive arterial studies showed diffuse plaque with circumferential calcified vessels. monophasic waveforms in the superficial femoral artery and below. Patient is in moderate pain. They are only using dial soaks. and gauze. 08/22/16 Patient returns to clinic with ischemic wounds on her  left 2nd,3rd, and first toes. She denies severe pain,fever or other systemic symptoms. Tells me she was in hospital for pneumonia. I will check these records. She states she received different antibiotics from the clindamycin she was on. she has been using silver alginate. [facility] has [facility]. She again tells me she does not wish to have attempts at revascularization or amputation. She does not feel she wound survive the surgery. 09/05/16; the patient continues to have gradual deterioration in the condition of the left foot. She has mummification of the left second toe wound on the left third toe, necrotic tissue on the plantar aspect of the left  Shannon [name], Shannon [name] ([id number]) Visit Report for 09/05/2016 Chief Complaint Document Details Patient Name: Shannon [name], Shannon [name] Date of Service: 09/05/2016 10:15 AM Medical Record Patient Account Number: [id number] Number: Treating RN: Baruch Gouty, RN, BSN, Rita [birth year] [phone] y.o. Other Clinician: Date of Birth/Sex: Female) Treating ROBSON, MICHAEL Primary Care Provider: Eulas Post, MONICA Provider/Extender: G Referring Provider: Gildardo Cranker Weeks in Treatment: 4 Information Obtained from: Patient Chief Complaint 08/08/16; patient is here for review of wound of the left 2nd toe, left first toe and a small area of the right first toe associated with severe PAD Electronic Signature(s) Signed: 09/05/2016 5:27:39 PM By: Linton Ham MD Entered By: Linton Ham on 09/05/2016 11:10:42 Shannon [name] ([id number]) -------------------------------------------------------------------------------- HPI Details Patient Name: Shannon [name] Date of Service: 09/05/2016 10:15 AM Medical Record Patient Account Number: [id number] Number: Treating RN: Baruch Gouty, RN, BSN, Rita 07-19-1923 (83 y.o. Other Clinician: Date of Birth/Sex: Female) Treating ROBSON, MICHAEL Primary Care Provider: Eulas Post, MONICA Provider/Extender: G Referring Provider: Gildardo Cranker Weeks in Treatment: 4 History of Present Illness HPI Description: 08/08/16; this is a 83 year old women who arrives in our clinic referred from her podiatrist Dr. Amalia Hailey. She is a type 2 diabetic with PAD who recently came to the attention of vein and vascular in [place]. She presented with a black toe on the left foot. She was admitted to hospital with this from 07/28/16-08/01/16. MRI of the foot suggested a tiny subcutaneous abscess at the base of the second proximal phalynx suggested osteomyelitis. She did not wish to consider amputation or angiography. She received vanc and cephapime in the hospital discharged on clindamycin for a month. She is on Coumadin.  Stage 3 crf, Previous non invasive arterial studies showed diffuse plaque with circumferential calcified vessels. monophasic waveforms in the superficial femoral artery and below. Patient is in moderate pain. They are only using dial soaks. and gauze. 08/22/16 Patient returns to clinic with ischemic wounds on her left 2nd,3rd, and first toes. She denies severe pain,fever or other systemic symptoms. Tells me she was in hospital for pneumonia. I will check these records. She states she received different antibiotics from the clindamycin she was on. she has been using silver alginate. [facility]. She again tells me she does not wish to have attempts at revascularization or amputation. She does not feel she wound survive the surgery. 09/05/16; the patient continues to have gradual deterioration in the condition of the left foot. She has mummification of the left second toe wound on the left third toe, necrotic tissue on the plantar aspect of the left second toe. She had previously refused angiography or amputation. She was seen by vein and vascular in [place]. She is using silver alginate. She seems a little more accepting of the thought of surgery today I think she has been talking to her family. She asked me at what level her leg would need to be amputated Electronic Signature(s) Signed: 09/05/2016 5:27:39 PM By: Linton Ham MD Entered By: Linton Ham on 09/05/2016 11:12:42 Shannon [name] ([id number]) -------------------------------------------------------------------------------- Physical Exam Details Patient Name: Shannon [name] Date of Service: 09/05/2016 10:15 AM Medical Record Patient Account Number: [id number] Number: Treating RN: Baruch Gouty, RN, BSN, Rita 1923-11-14 [phone] y.o. Other Clinician: Date of Birth/Sex: Female) Treating ROBSON, MICHAEL Primary Care Provider: Eulas Post, MONICA Provider/Extender: G Referring Provider: Eulas Post, MONICA Weeks in Treatment:  4 Constitutional Sitting or standing Blood Pressure is within target range for patient.. Pulse regular and within target range for patient.[name] Respirations regular, non-labored and within target range.[name]

## 2016-09-07 ENCOUNTER — Telehealth: Payer: Self-pay | Admitting: Cardiology

## 2016-09-07 DIAGNOSIS — R531 Weakness: Secondary | ICD-10-CM | POA: Diagnosis not present

## 2016-09-07 NOTE — Telephone Encounter (Signed)
New message      Pt c/o swelling: STAT is pt has developed SOB within 24 hours  1. How long have you been experiencing swelling?  3 days ago wt was 149 lbs 2. Where is the swelling located? Ankles and feet 3.  Are you currently taking a "fluid pill"? PCP ordered lasix 40mg  daily for 3 days (3 days ended today).  Wt now 146 lbs but normal wt is 136 lbs  4.  Are you currently SOB?  Pt is always sob, but it is a little worse than normal 5.  Have you traveled recently?no Palative NP was out today and reporting findings to cardiologist

## 2016-09-07 NOTE — Telephone Encounter (Signed)
Left a message for [facility] with Palliative Care, to call back.

## 2016-09-08 DIAGNOSIS — E11621 Type 2 diabetes mellitus with foot ulcer: Secondary | ICD-10-CM | POA: Diagnosis not present

## 2016-09-08 DIAGNOSIS — I130 Hypertensive heart and chronic kidney disease with heart failure and stage 1 through stage 4 chronic kidney disease, or unspecified chronic kidney disease: Secondary | ICD-10-CM | POA: Diagnosis not present

## 2016-09-08 DIAGNOSIS — J441 Chronic obstructive pulmonary disease with (acute) exacerbation: Secondary | ICD-10-CM | POA: Diagnosis not present

## 2016-09-08 DIAGNOSIS — I2510 Atherosclerotic heart disease of native coronary artery without angina pectoris: Secondary | ICD-10-CM | POA: Diagnosis not present

## 2016-09-08 DIAGNOSIS — L97523 Non-pressure chronic ulcer of other part of left foot with necrosis of muscle: Secondary | ICD-10-CM | POA: Diagnosis not present

## 2016-09-08 DIAGNOSIS — L97521 Non-pressure chronic ulcer of other part of left foot limited to breakdown of skin: Secondary | ICD-10-CM | POA: Diagnosis not present

## 2016-09-08 NOTE — Telephone Encounter (Signed)
LMTCB

## 2016-09-12 DIAGNOSIS — L97523 Non-pressure chronic ulcer of other part of left foot with necrosis of muscle: Secondary | ICD-10-CM | POA: Diagnosis not present

## 2016-09-12 DIAGNOSIS — I2510 Atherosclerotic heart disease of native coronary artery without angina pectoris: Secondary | ICD-10-CM | POA: Diagnosis not present

## 2016-09-12 DIAGNOSIS — I130 Hypertensive heart and chronic kidney disease with heart failure and stage 1 through stage 4 chronic kidney disease, or unspecified chronic kidney disease: Secondary | ICD-10-CM | POA: Diagnosis not present

## 2016-09-12 DIAGNOSIS — J441 Chronic obstructive pulmonary disease with (acute) exacerbation: Secondary | ICD-10-CM | POA: Diagnosis not present

## 2016-09-12 DIAGNOSIS — L97521 Non-pressure chronic ulcer of other part of left foot limited to breakdown of skin: Secondary | ICD-10-CM | POA: Diagnosis not present

## 2016-09-12 DIAGNOSIS — E11621 Type 2 diabetes mellitus with foot ulcer: Secondary | ICD-10-CM | POA: Diagnosis not present

## 2016-09-12 NOTE — Telephone Encounter (Signed)
Left a message to call back.  3rd attempt at trying to make contact with Palliative Care, with no return call back.  Pts PCP has been closely following her and Palliative care ordered through the PCP.  Pt has a follow-up appt with her PCP on 09/22/16. Will close this encounter and refer to it as needed.

## 2016-09-13 ENCOUNTER — Telehealth: Payer: Self-pay | Admitting: *Deleted

## 2016-09-13 ENCOUNTER — Ambulatory Visit (INDEPENDENT_AMBULATORY_CARE_PROVIDER_SITE_OTHER): Payer: Medicare Other | Admitting: *Deleted

## 2016-09-13 DIAGNOSIS — I70262 Atherosclerosis of native arteries of extremities with gangrene, left leg: Secondary | ICD-10-CM | POA: Diagnosis not present

## 2016-09-13 DIAGNOSIS — I4892 Unspecified atrial flutter: Secondary | ICD-10-CM

## 2016-09-13 DIAGNOSIS — Z5181 Encounter for therapeutic drug level monitoring: Secondary | ICD-10-CM | POA: Diagnosis not present

## 2016-09-13 LAB — POCT INR: INR: 2.9

## 2016-09-13 MED ORDER — BENZONATATE 100 MG PO CAPS: ORAL_CAPSULE | ORAL | 1 refills | Status: DC

## 2016-09-13 NOTE — Telephone Encounter (Signed)
Tessalon perles 200mg  #40 take 1 tab po TID prn cough with 1 RF

## 2016-09-13 NOTE — Telephone Encounter (Signed)
Patient daughter notified that Rx was faxed to pharmacy.

## 2016-09-13 NOTE — Telephone Encounter (Signed)
Shannon [name] with [facility] called and wanted verbal orders for Home Health twice weekly. Verbal orders given.

## 2016-09-13 NOTE — Telephone Encounter (Signed)
Patient caregiver, [name] called and wants a refill on Tessalon Perles (not in patient's current medication List). Stated patient is coughing a lot with SOB. Doing the Nebulizer 3-4 times daily. [facility] checked patient and lungs are clear and cough is non productive, no fever. Caregiver wants to know if there is anything else to do for the cough.  Please Advise.

## 2016-09-14 ENCOUNTER — Inpatient Hospital Stay (HOSPITAL_COMMUNITY)
Admission: EM | Admit: 2016-09-14 | Discharge: 2016-09-27 | DRG: 252 | Disposition: A | Discharge status: Home Health | Payer: Medicare Other | Attending: Internal Medicine | Admitting: Internal Medicine

## 2016-09-14 ENCOUNTER — Telehealth: Payer: Self-pay

## 2016-09-14 ENCOUNTER — Emergency Department (HOSPITAL_COMMUNITY): Payer: Medicare Other

## 2016-09-14 DIAGNOSIS — I70262 Atherosclerosis of native arteries of extremities with gangrene, left leg: Secondary | ICD-10-CM | POA: Diagnosis present

## 2016-09-14 DIAGNOSIS — Z87891 Personal history of nicotine dependence: Secondary | ICD-10-CM

## 2016-09-14 DIAGNOSIS — F419 Anxiety disorder, unspecified: Secondary | ICD-10-CM | POA: Diagnosis present

## 2016-09-14 DIAGNOSIS — R791 Abnormal coagulation profile: Secondary | ICD-10-CM | POA: Diagnosis not present

## 2016-09-14 DIAGNOSIS — Z8711 Personal history of peptic ulcer disease: Secondary | ICD-10-CM

## 2016-09-14 DIAGNOSIS — E1152 Type 2 diabetes mellitus with diabetic peripheral angiopathy with gangrene: Principal | ICD-10-CM | POA: Diagnosis present

## 2016-09-14 DIAGNOSIS — Z8614 Personal history of Methicillin resistant Staphylococcus aureus infection: Secondary | ICD-10-CM

## 2016-09-14 DIAGNOSIS — I5032 Chronic diastolic (congestive) heart failure: Secondary | ICD-10-CM | POA: Diagnosis not present

## 2016-09-14 DIAGNOSIS — J9690 Respiratory failure, unspecified, unspecified whether with hypoxia or hypercapnia: Secondary | ICD-10-CM

## 2016-09-14 DIAGNOSIS — F329 Major depressive disorder, single episode, unspecified: Secondary | ICD-10-CM | POA: Diagnosis present

## 2016-09-14 DIAGNOSIS — J9601 Acute respiratory failure with hypoxia: Secondary | ICD-10-CM | POA: Diagnosis not present

## 2016-09-14 DIAGNOSIS — K219 Gastro-esophageal reflux disease without esophagitis: Secondary | ICD-10-CM | POA: Diagnosis present

## 2016-09-14 DIAGNOSIS — E871 Hypo-osmolality and hyponatremia: Secondary | ICD-10-CM | POA: Diagnosis present

## 2016-09-14 DIAGNOSIS — Z66 Do not resuscitate: Secondary | ICD-10-CM | POA: Diagnosis present

## 2016-09-14 DIAGNOSIS — E785 Hyperlipidemia, unspecified: Secondary | ICD-10-CM | POA: Diagnosis present

## 2016-09-14 DIAGNOSIS — E119 Type 2 diabetes mellitus without complications: Secondary | ICD-10-CM

## 2016-09-14 DIAGNOSIS — Z833 Family history of diabetes mellitus: Secondary | ICD-10-CM

## 2016-09-14 DIAGNOSIS — N183 Chronic kidney disease, stage 3 unspecified: Secondary | ICD-10-CM | POA: Diagnosis present

## 2016-09-14 DIAGNOSIS — J90 Pleural effusion, not elsewhere classified: Secondary | ICD-10-CM

## 2016-09-14 DIAGNOSIS — E11621 Type 2 diabetes mellitus with foot ulcer: Secondary | ICD-10-CM | POA: Diagnosis present

## 2016-09-14 DIAGNOSIS — Z961 Presence of intraocular lens: Secondary | ICD-10-CM | POA: Diagnosis present

## 2016-09-14 DIAGNOSIS — I495 Sick sinus syndrome: Secondary | ICD-10-CM | POA: Diagnosis present

## 2016-09-14 DIAGNOSIS — I96 Gangrene, not elsewhere classified: Secondary | ICD-10-CM | POA: Diagnosis not present

## 2016-09-14 DIAGNOSIS — E1142 Type 2 diabetes mellitus with diabetic polyneuropathy: Secondary | ICD-10-CM | POA: Diagnosis present

## 2016-09-14 DIAGNOSIS — J441 Chronic obstructive pulmonary disease with (acute) exacerbation: Secondary | ICD-10-CM | POA: Diagnosis present

## 2016-09-14 DIAGNOSIS — Z823 Family history of stroke: Secondary | ICD-10-CM

## 2016-09-14 DIAGNOSIS — I2510 Atherosclerotic heart disease of native coronary artery without angina pectoris: Secondary | ICD-10-CM | POA: Diagnosis present

## 2016-09-14 DIAGNOSIS — Z8249 Family history of ischemic heart disease and other diseases of the circulatory system: Secondary | ICD-10-CM

## 2016-09-14 DIAGNOSIS — Z794 Long term (current) use of insulin: Secondary | ICD-10-CM

## 2016-09-14 DIAGNOSIS — Z931 Gastrostomy status: Secondary | ICD-10-CM

## 2016-09-14 DIAGNOSIS — Z86718 Personal history of other venous thrombosis and embolism: Secondary | ICD-10-CM

## 2016-09-14 DIAGNOSIS — I10 Essential (primary) hypertension: Secondary | ICD-10-CM | POA: Diagnosis present

## 2016-09-14 DIAGNOSIS — I130 Hypertensive heart and chronic kidney disease with heart failure and stage 1 through stage 4 chronic kidney disease, or unspecified chronic kidney disease: Secondary | ICD-10-CM | POA: Diagnosis not present

## 2016-09-14 DIAGNOSIS — I70269 Atherosclerosis of native arteries of extremities with gangrene, unspecified extremity: Secondary | ICD-10-CM | POA: Diagnosis present

## 2016-09-14 DIAGNOSIS — Z6822 Body mass index (BMI) 22.0-22.9, adult: Secondary | ICD-10-CM

## 2016-09-14 DIAGNOSIS — J9811 Atelectasis: Secondary | ICD-10-CM | POA: Diagnosis not present

## 2016-09-14 DIAGNOSIS — F4322 Adjustment disorder with anxiety: Secondary | ICD-10-CM | POA: Diagnosis present

## 2016-09-14 DIAGNOSIS — Z8673 Personal history of transient ischemic attack (TIA), and cerebral infarction without residual deficits: Secondary | ICD-10-CM

## 2016-09-14 DIAGNOSIS — Z9049 Acquired absence of other specified parts of digestive tract: Secondary | ICD-10-CM

## 2016-09-14 DIAGNOSIS — Z934 Other artificial openings of gastrointestinal tract status: Secondary | ICD-10-CM

## 2016-09-14 DIAGNOSIS — E039 Hypothyroidism, unspecified: Secondary | ICD-10-CM | POA: Diagnosis present

## 2016-09-14 DIAGNOSIS — J189 Pneumonia, unspecified organism: Secondary | ICD-10-CM

## 2016-09-14 DIAGNOSIS — E1122 Type 2 diabetes mellitus with diabetic chronic kidney disease: Secondary | ICD-10-CM | POA: Diagnosis present

## 2016-09-14 DIAGNOSIS — R0602 Shortness of breath: Secondary | ICD-10-CM | POA: Diagnosis not present

## 2016-09-14 DIAGNOSIS — Z880 Allergy status to penicillin: Secondary | ICD-10-CM

## 2016-09-14 DIAGNOSIS — E440 Moderate protein-calorie malnutrition: Secondary | ICD-10-CM | POA: Diagnosis present

## 2016-09-14 DIAGNOSIS — D62 Acute posthemorrhagic anemia: Secondary | ICD-10-CM | POA: Diagnosis not present

## 2016-09-14 DIAGNOSIS — E873 Alkalosis: Secondary | ICD-10-CM | POA: Diagnosis present

## 2016-09-14 DIAGNOSIS — E1165 Type 2 diabetes mellitus with hyperglycemia: Secondary | ICD-10-CM | POA: Diagnosis present

## 2016-09-14 DIAGNOSIS — Z888 Allergy status to other drugs, medicaments and biological substances status: Secondary | ICD-10-CM

## 2016-09-14 DIAGNOSIS — Z7901 Long term (current) use of anticoagulants: Secondary | ICD-10-CM

## 2016-09-14 DIAGNOSIS — L97529 Non-pressure chronic ulcer of other part of left foot with unspecified severity: Secondary | ICD-10-CM | POA: Diagnosis present

## 2016-09-14 DIAGNOSIS — Z79899 Other long term (current) drug therapy: Secondary | ICD-10-CM

## 2016-09-14 LAB — CBC WITH DIFFERENTIAL/PLATELET
Basophils Absolute: 0 10*3/uL (ref 0.0–0.1)
Basophils Relative: 0 %
Eosinophils Absolute: 0 10*3/uL (ref 0.0–0.7)
Eosinophils Relative: 0 %
HCT: 31 % — ABNORMAL LOW (ref 36.0–46.0)
Hemoglobin: 10.6 g/dL — ABNORMAL LOW (ref 12.0–15.0)
Lymphocytes Relative: 6 %
Lymphs Abs: 0.6 10*3/uL — ABNORMAL LOW (ref 0.7–4.0)
MCH: 29.4 pg (ref 26.0–34.0)
MCHC: 34.2 g/dL (ref 30.0–36.0)
MCV: 85.9 fL (ref 78.0–100.0)
Monocytes Absolute: 0.7 10*3/uL (ref 0.1–1.0)
Monocytes Relative: 6 %
Neutro Abs: 9.3 10*3/uL — ABNORMAL HIGH (ref 1.7–7.7)
Neutrophils Relative %: 88 %
Platelets: 342 10*3/uL (ref 150–400)
RBC: 3.61 MIL/uL — ABNORMAL LOW (ref 3.87–5.11)
RDW: 16.6 % — ABNORMAL HIGH (ref 11.5–15.5)
WBC: 10.6 10*3/uL — ABNORMAL HIGH (ref 4.0–10.5)

## 2016-09-14 LAB — BASIC METABOLIC PANEL
Anion gap: 11 (ref 5–15)
BUN: 38 mg/dL — ABNORMAL HIGH (ref 6–20)
CO2: 24 mmol/L (ref 22–32)
Calcium: 9.2 mg/dL (ref 8.9–10.3)
Chloride: 94 mmol/L — ABNORMAL LOW (ref 101–111)
Creatinine, Ser: 1.02 mg/dL — ABNORMAL HIGH (ref 0.44–1.00)
GFR calc Af Amer: 54 mL/min — ABNORMAL LOW (ref >60)
GFR calc non Af Amer: 47 mL/min — ABNORMAL LOW (ref >60)
Glucose, Bld: 154 mg/dL — ABNORMAL HIGH (ref 65–99)
Potassium: 4.3 mmol/L (ref 3.5–5.1)
Sodium: 129 mmol/L — ABNORMAL LOW (ref 135–145)

## 2016-09-14 LAB — I-STAT CG4 LACTIC ACID, ED: Lactic Acid, Venous: 0.87 mmol/L (ref 0.5–1.9)

## 2016-09-14 LAB — BRAIN NATRIURETIC PEPTIDE: B Natriuretic Peptide: 226.1 pg/mL — ABNORMAL HIGH (ref 0.0–100.0)

## 2016-09-14 LAB — TROPONIN I: Troponin I: 0.03 ng/mL (ref <0.03)

## 2016-09-14 MED ORDER — ALBUTEROL SULFATE (2.5 MG/3ML) 0.083% IN NEBU
5.0000 mg | INHALATION_SOLUTION | Freq: Once | RESPIRATORY_TRACT | Status: AC
  Administered 2016-09-14: 5 mg via RESPIRATORY_TRACT
  Filled 2016-09-14: qty 6

## 2016-09-14 MED ORDER — VANCOMYCIN HCL IN DEXTROSE 1-5 GM/200ML-% IV SOLN
1000.0000 mg | Freq: Once | INTRAVENOUS | Status: AC
  Administered 2016-09-14: 1000 mg via INTRAVENOUS
  Filled 2016-09-14: qty 200

## 2016-09-14 MED ORDER — METRONIDAZOLE IN NACL 5-0.79 MG/ML-% IV SOLN
500.0000 mg | Freq: Three times a day (TID) | INTRAVENOUS | Status: DC
  Administered 2016-09-15 – 2016-09-17 (×8): 500 mg via INTRAVENOUS
  Filled 2016-09-14 (×10): qty 100

## 2016-09-14 MED ORDER — DEXTROSE 5 % IV SOLN
2.0000 g | INTRAVENOUS | Status: AC
  Administered 2016-09-15: 2 g via INTRAVENOUS
  Filled 2016-09-14: qty 2

## 2016-09-14 MED ORDER — LEVOFLOXACIN IN D5W 750 MG/150ML IV SOLN
750.0000 mg | Freq: Once | INTRAVENOUS | Status: DC
  Filled 2016-09-14: qty 150

## 2016-09-14 MED ORDER — SODIUM CHLORIDE 0.9 % IV SOLN
INTRAVENOUS | Status: DC
  Administered 2016-09-14: 1 mL/h via INTRAVENOUS
  Administered 2016-09-15: 10 mL/h via INTRAVENOUS

## 2016-09-14 NOTE — ED Triage Notes (Signed)
Per EMS:  Pt is coming from home. Pt c/o chest pain due to work of breathing.  Pt's oxygen sat was at 72 on RA Pt had absent lung sounds on the left side and wheezes and ronchi on the right.  Pt had an albuterol tx at 1600 per family.  Pt had duoneb and 125 of solumedrol with EMS, pt able to cough up secretions.  22 in between forefinger and thumb in her left hand.   Vitals: HR 84 BP: 160/60 CBG 147

## 2016-09-14 NOTE — ED Triage Notes (Signed)
Pt come via ems, c/o SOB with difficulty breathing,  Ems gave duoneb and 125 solumedrol. Pt given breathing tx of albuterol. Denies chest pain.  Assessment in progress.

## 2016-09-14 NOTE — ED Notes (Signed)
Bed: WA07 Expected date:  Expected time:  Means of arrival:  Comments: EMS-SOB 

## 2016-09-14 NOTE — Telephone Encounter (Signed)
[name] called today concerned on which medication her mother should be taking concerning her nutritional needs. She stated that [facility] said they are unable to give her mother Glucerna and would need authorization from her provider. She said she was told that someone would help her with this during her appointment. The daughter is unsure of which feeding supplement her mother needs. Please advise.

## 2016-09-14 NOTE — ED Provider Notes (Signed)
[facility] DEPT Provider Note   CSN: [id number] Arrival date & time: 09/14/16  1957     History   Chief Complaint Chief Complaint  Patient presents with  . Shortness of Breath  . Cough    HPI Shannon [name] is a 83 y.o. female.  83 y/o female here w/ cough and sob x several days No fever but emesis w/o diarrhea Denies chest pain Hx limited due to her current condition Ems called and pt in resp distress w/ pulse Ox of 72% on RA and she was tx w/ albuterol and solumedrol      Past Medical History:  Diagnosis Date  . Anemia    previous blood transfusions  . Arthritis    "all over"  . Asthma   . Bradycardia    requiring previous d/c of BB and reduction of amiodarone  . CAD (coronary artery disease)    nonobstructive per notes  . Chronic diastolic CHF (congestive heart failure) ([place])   . CKD (chronic kidney disease), stage III   . Complication of blood transfusion    "got the wrong blood type at [facility] in ~ 2015; no adverse reaction that we are aware of"/daughter, [name] (01/27/2016)  . COPD (chronic obstructive pulmonary disease) ([place])   . Depression    "light case"  . DVT (deep venous thrombosis) ([place]) 01/2016   a. LLE DVT 01/2016 - switched from Eliquis to Coumadin.  . Gastric stenosis    a. s/p stomach tube  . GERD (gastroesophageal reflux disease)   . History of blood transfusion    "several" (01/27/2016)  . History of stomach ulcers   . Hyperlipidemia   . Hypertension   . Hypothyroidism   . Paraesophageal hernia   . Perforated gastric ulcer ([place])   . Seasonal allergies   . SIADH (syndrome of inappropriate ADH production) ([place])    [name] 01/10/2015  . Small bowel obstruction ([place])    "I don't know how many" (01/11/2015)  . Stroke ([place])    "light one"  . Type II diabetes mellitus ([place])    "related to prednisone use  for > 20 yr; once predinose stopped; no more DM RX" (01/27/2016)  . Ventral hernia with bowel obstruction     Patient Active  Problem List   Diagnosis Date Noted  . Recurrent aspiration pneumonia ([place])   . Hypokalemia 08/15/2016  . COPD (chronic obstructive pulmonary disease) ([place]) 08/12/2016  . COPD exacerbation ([place])   . Atherosclerosis of native arteries of the extremities with gangrene ([place]) 07/28/2016  . Gangrene ([place]) 07/28/2016  . Calcium channel blocker adverse reaction 06/08/2016  . Elevated TSH   . Aspiration pneumonitis ([place])   . Other specified hypothyroidism   . Acute respiratory failure with hypoxia ([place]) 05/22/2016  . HCAP (healthcare-associated pneumonia)   . Acute on chronic congestive heart failure ([place])   . CHF (NYHA class IV, ACC/AHA stage D) ([place])   . Advance care planning   . Palliative care by specialist   . Shortness of breath   . Pressure injury of skin 03/11/2016  . PEG (percutaneous endoscopic gastrostomy) status ([place]) 03/10/2016  . Acute on chronic diastolic congestive heart failure ([place]) 03/02/2016  . Encounter for therapeutic drug monitoring 02/16/2016  . Sepsis ([place]) 02/08/2016  . UTI (urinary tract infection) 02/08/2016  . Warfarin anticoagulation   . Chronic seasonal allergic rhinitis   . Sinus pain   . Acute renal insufficiency 01/26/2016  . Acute deep vein thrombosis (DVT) of left lower extremity (  [place])   . Acute respiratory failure ([place])   . Respiratory alkalosis   . Atrial flutter ([place]) 01/24/2016  . Chronic diastolic CHF (congestive heart failure) ([place]) 01/24/2016  . Infection 01/24/2016  . Bilateral leg edema 12/29/2015  . Hypothyroidism due to medication   . Weakness   . Pyloric stenosis   . Pressure ulcer 09/15/2015  . Hyperkalemia 09/12/2015  . Acute osteomyelitis of toe of right foot ([place])   . PVD (peripheral vascular disease) ([place])   . Adjustment disorder with anxiety 08/29/2015  . Diabetic ulcer of toe of right foot associated with type 2 diabetes mellitus, with necrosis of muscle ([place])   . Diverticulitis of large intestine without perforation or  abscess without bleeding   . Colitis 08/26/2015  . Acute on chronic diastolic CHF (congestive heart failure), NYHA class 4 ([place]) 08/02/2015  . Esophageal candidiasis ([place])   . Goals of care, counseling/discussion   . Dyspnea   . SOB (shortness of breath)   . Acute renal failure superimposed on stage 4 chronic kidney disease ([place])   . Encounter for long-term (current) use of other high-risk medications   . Respiratory distress 07/06/2015  . Dysphagia 07/05/2015  . Asthma with allergic rhinitis 06/04/2015  . Hypertensive heart disease 03/18/2015  . Malnutrition of moderate degree 01/28/2015  . Syncope 01/27/2015  . Fever, unspecified 01/22/2015  . Symptomatic bradycardia 01/21/2015  . Hypotension due to drugs 01/21/2015  . Palliative care encounter   . DNR (do not resuscitate) discussion   . Protein-calorie malnutrition, severe 01/12/2015  . Elevated troponin 01/12/2015  . Essential hypertension 01/12/2015  . AP (abdominal pain) 01/10/2015  . Cerebrovascular disease 07/14/2014  . Diabetic polyneuropathy associated with type 2 diabetes mellitus ([place]) 07/14/2014  . Hyperlipidemia 07/14/2014  . Gastric outlet obstruction 05/21/2014  . Unstable gait 04/29/2014  . Hyponatremia 03/23/2014  . Acute encephalopathy 03/22/2014  . Slurred speech   . Gastroparesis   . Slow transit constipation 02/03/2014  . Carotid stenosis 02/03/2014  . Demand ischemia ([place]) 01/21/2014  . Gastric bezoar 01/20/2014  . Anemia of chronic disease 01/18/2014  . Chronic kidney insufficiency, stage 3 (moderate) 01/18/2014  . Depression 09/29/2013  . Essential hypertension, benign 09/22/2013  . GERD (gastroesophageal reflux disease) 09/22/2013  . Dyslipidemia 09/22/2013  . Diabetes mellitus type 2 in nonobese ([place]) 09/22/2013  . Persistent atrial fibrillation ([place]) 09/22/2013  . CVA (cerebral vascular accident) ([place]) 09/22/2013  . Diabetic neuropathy ([place]) 09/22/2013  . Hypothyroidism 09/22/2013  . Chronic  gastrojejunal ulcer with perforation ([place]) 09/07/2013  . Accelerated hypertension 09/07/2013  . Systemic lupus erythematosus (SLE) inhibitor 09/07/2013  . Other specified abnormal immunological findings in serum 09/07/2013    Past Surgical History:  Procedure Laterality Date  . CATARACT EXTRACTION W/ INTRAOCULAR LENS  IMPLANT, BILATERAL    . CHOLECYSTECTOMY OPEN    . COLECTOMY    . ESOPHAGOGASTRODUODENOSCOPY N/A 01/19/2014   Procedure: ESOPHAGOGASTRODUODENOSCOPY (EGD);  Surgeon: Irene Shipper, MD;  Location: [facility];  Service: Endoscopy;  Laterality: N/A;  . ESOPHAGOGASTRODUODENOSCOPY N/A 01/20/2014   Procedure: ESOPHAGOGASTRODUODENOSCOPY (EGD);  Surgeon: Irene Shipper, MD;  Location: [facility];  Service: Endoscopy;  Laterality: N/A;  . ESOPHAGOGASTRODUODENOSCOPY N/A 03/19/2014   Procedure: ESOPHAGOGASTRODUODENOSCOPY (EGD);  Surgeon: Milus Banister, MD;  Location: [facility];  Service: Endoscopy;  Laterality: N/A;  . ESOPHAGOGASTRODUODENOSCOPY N/A 07/08/2015   Procedure: ESOPHAGOGASTRODUODENOSCOPY (EGD);  Surgeon: Doran Stabler, MD;  Location[facility] ENDOSCOPY;  Service: Endoscopy;  Laterality: N/A;  . ESOPHAGOGASTRODUODENOSCOPY (EGD)  WITH PROPOFOL N/A 09/15/2015   Procedure: ESOPHAGOGASTRODUODENOSCOPY (EGD) WITH PROPOFOL;  Surgeon: Manus Gunning, MD;  Location: [facility] ENDOSCOPY;  Service: Gastroenterology;  Laterality: N/A;  . GASTROJEJUNOSTOMY     hx/notes 01/10/2015  . GASTROJEJUNOSTOMY N/A 09/23/2015   Procedure: OPEN GASTROJEJUNOSTOMY TUBE PLACEMENT;  Surgeon: Arta Bruce Kinsinger, MD;  Location: [facility] ORS;  Service: General;  Laterality: N/A;  . GLAUCOMA SURGERY Bilateral   . HERNIA REPAIR  2015  . IR GENERIC HISTORICAL  01/07/2016   IR GJ TUBE CHANGE 01/07/2016 Jacqulynn Cadet, MD [facility]  . IR GENERIC HISTORICAL  01/27/2016   IR MECH REMOV OBSTRUC MAT ANY COLON TUBE W/FLUORO 01/27/2016 Markus Daft, MD [facility]  . IR GENERIC HISTORICAL  02/07/2016   IR PATIENT EVAL  TECH 0-60 MINS Darrell K Allred, PA-C [facility]  . IR GENERIC HISTORICAL  02/08/2016   IR GJ TUBE CHANGE 02/08/2016 Greggory Keen, MD [facility]  . IR GENERIC HISTORICAL  01/06/2016   IR GJ TUBE CHANGE 01/06/2016 [facility]-RAD OUT REF  . IR GENERIC HISTORICAL  05/02/2016   IR CM INJ ANY COLONIC TUBE W/FLUORO 05/02/2016 Arne Cleveland, MD [facility]  . IR GENERIC HISTORICAL  05/15/2016   IR GJ TUBE CHANGE 05/15/2016 Sandi Mariscal, MD [facility]  . IR GENERIC HISTORICAL  06/28/2016   IR GJ TUBE CHANGE 06/28/2016 [facility]  . LAPAROTOMY N/A 01/20/2015   Procedure: EXPLORATORY LAPAROTOMY;  Surgeon: Coralie Keens, MD;  Location: [facility];  Service: General;  Laterality: N/A;  . LYSIS OF ADHESION N/A 01/20/2015   Procedure: LYSIS OF ADHESIONS < 1 HOUR;  Surgeon: Coralie Keens, MD;  Location: [facility];  Service: General;  Laterality: N/A;  . TONSILLECTOMY    . TUBAL LIGATION    . VENTRAL HERNIA REPAIR  2015   incarcerated ventral hernia ([facility] 09/2013)/notes 01/10/2015    OB History    No data available       Home Medications    Prior to Admission medications   Medication Sig Start Date End Date Taking? Authorizing Provider  acetaminophen (TYLENOL) 500 MG tablet Take 1,000 mg by mouth every 6 (six) hours as needed (pain).    [provider]  AMINO ACIDS-PROTEIN HYDROLYS PO Take 30 mLs by mouth 2 (two) times daily.    [provider]  amLODipine (NORVASC) 10 MG tablet Take 10 mg by mouth daily. 09/05/16   [provider]  atorvastatin (LIPITOR) 10 MG tablet TAKE ONE TABLET BY MOUTH ONCE DAILY 05/22/16   Gildardo Cranker, DO  b complex vitamins tablet Take 1 tablet by mouth daily.     [provider]  benzonatate (TESSALON PERLES) 100 MG capsule Take two capsules by mouth three times daily as needed for cough 09/13/16   Gildardo Cranker, DO  calcium-vitamin D (OSCAL WITH D) 500-200 MG-UNIT per tablet Take 1 tablet by mouth daily with breakfast.     [provider]  carvedilol (COREG) 6.25 MG tablet Take 1 tablet (6.25 mg total) by mouth 2 (two) times daily. 08/18/16 09/17/16  Florencia Reasons, MD  chlorhexidine (PERIDEX) 0.12 % solution Use as directed 15 mLs in the mouth or throat 2 (two) times daily.    [provider]  fluticasone (FLONASE) 50 MCG/ACT nasal spray Place 2 sprays into both nostrils daily. 08/07/14   Gildardo Cranker, DO  furosemide (LASIX) 20 MG tablet Take 1 tablet (20 mg total) by mouth daily. 08/07/16   [name], NP  gabapentin (NEURONTIN) 300 MG  capsule 1 tablet by mouth three times daily, may have additional dose every 8 hours as needed pain Patient taking differently: Take 300-600 mg by mouth 3 (three) times daily. Take 600mg  by mouth in the morning, 300mg  by mouth in the afternoon, and 363my by mouth at bedtime 08/07/16   [name], NP  insulin aspart (NOVOLOG FLEXPEN) 100 UNIT/ML FlexPen Before each meal 3 times a day, 140-199 - 2 units, 200-250 - 4 units, 251-299 - 6 units,  300-349 - 8 units,  350 or above 10 units. Insulin PEN if approved, provide syringes and needles if needed. 08/18/16   Florencia Reasons, MD  ipratropium-albuterol (DUONEB) 0.5-2.5 (3) MG/3ML SOLN Take 3 mLs by nebulization every 6 (six) hours as needed (sob). Use 3 times daily x 4 days then every 6 hours as needed. 03/21/16   Eileen Stanford, PA-C  isosorbide mononitrate (IMDUR) 30 MG 24 hr tablet Take 30 mg by mouth daily. 07/20/16   [provider]  latanoprost (XALATAN) 0.005 % ophthalmic solution Place 1 drop into both eyes at bedtime. 06/02/14   Blanchie Serve, MD  levalbuterol [facility] HFA) 45 MCG/ACT inhaler Inhale 2 puffs into the lungs every 6 (six) hours as needed for wheezing or shortness of breath. 02/03/16   Gildardo Cranker, DO  levothyroxine (SYNTHROID, LEVOTHROID) 137 MCG tablet TAKE 1 TABLET (137 MCG TOTAL) BY MOUTH DAILY BEFORE BREAKFAST. 07/31/16   Estill Dooms, MD  Maltodextrin-Xanthan Gum (RESOURCE THICKENUP CLEAR) POWD  Take 120 g by mouth as needed (use to thicken liquids.). 11/11/15   Eugenie Filler, MD  Multiple Vitamin (MULTIVITAMIN WITH MINERALS) TABS tablet Take 1 tablet by mouth daily.    [provider]  Nutritional Supplements (FEEDING SUPPLEMENT, GLUCERNA 1.2 CAL,) LIQD glucerna 1.2 cal at 55cc/hr from 9pm to 9am, with Nepro 237 mL po q24h, Prostat 30 mL per tube BID") due to hyperglycemia 08/30/16   Gildardo Cranker, DO  Polyethyl Glycol-Propyl Glycol 0.4-0.3 % SOLN Place 1 drop into both eyes 4 (four) times daily.     [provider]  polyethylene glycol (MIRALAX / GLYCOLAX) packet Take 17 g by mouth daily as needed for mild constipation.    [provider]  potassium chloride SA (K-DUR,KLOR-CON) 20 MEQ tablet Take 1 tablet (20 mEq total) by mouth daily. For 3 days 09/04/16   Imogene Burn, PA-C  RESTASIS 0.05 % ophthalmic emulsion USE 1 DROP INTO BOTH EYES TWICE DAILY 06/28/16   Estill Dooms, MD  sertraline (ZOLOFT) 100 MG tablet TAKE ONE TABLET BY MOUTH ONCE DAILY FOR DEPRESSION 04/10/16   Gildardo Cranker, DO  warfarin (COUMADIN) 2.5 MG tablet TAKE AS DIRECTED BY COUMADIN CLINIC. 08/15/16   Dorothy Spark, MD  Water For Irrigation, Sterile (FREE WATER) SOLN Place 230 mLs into feeding tube 3 (three) times daily.    [provider]    Family History Family History  Problem Relation Age of Onset  . Stroke Mother   . Hypertension Mother   . Diabetes Brother   . Heart attack Neg Hx     Social History Social History  Substance Use Topics  . Smoking status: Never Smoker  . Smokeless tobacco: Former Systems developer    Types: Snuff     Comment: "used snuff in my younger days"  . Alcohol use No     Allergies   Penicillins and Other   Review of Systems Review of Systems  Unable to perform ROS: Acuity of condition  Physical Exam Updated Vital Signs BP (!) 154/56 (BP Location: Right Arm)   Pulse 78   Temp 98.2 F (36.8 C) (Oral)   Resp (!) 25   SpO2  98%   Physical Exam  Constitutional: She is oriented to person, place, and time. She appears cachectic. She has a sickly appearance. She appears ill.  HENT:  Head: Normocephalic and atraumatic.  Eyes: Conjunctivae, EOM and lids are normal. Pupils are equal, round, and reactive to light.  Neck: Normal range of motion. Neck supple. No tracheal deviation present. No thyroid mass present.  Cardiovascular: Normal rate, regular rhythm and normal heart sounds.  Exam reveals no gallop.   No murmur heard. Pulmonary/Chest: Effort normal. No stridor. No respiratory distress. She has decreased breath sounds in the right lower field and the left lower field. She has wheezes in the right lower field and the left lower field. She has no rhonchi. She has no rales.  Abdominal: Soft. Normal appearance and bowel sounds are normal. She exhibits no distension. There is no tenderness. There is no rebound and no CVA tenderness.  Musculoskeletal: Normal range of motion. She exhibits no edema or tenderness.       Feet:  Neurological: She is alert and oriented to person, place, and time. She has normal strength. No cranial nerve deficit or sensory deficit. GCS eye subscore is 4. GCS verbal subscore is 5. GCS motor subscore is 6.  Skin: Skin is warm and dry. No abrasion and no rash noted.  Psychiatric: Her speech is delayed.  Nursing note and vitals reviewed.    ED Treatments / Results  Labs (all labs ordered are listed, but only abnormal results are displayed) Labs Reviewed  CULTURE, BLOOD (ROUTINE X 2)  CULTURE, BLOOD (ROUTINE X 2)  CBC WITH DIFFERENTIAL/PLATELET  BASIC METABOLIC PANEL  TROPONIN I  BRAIN NATRIURETIC PEPTIDE  I-STAT CG4 LACTIC ACID, ED    EKG  EKG Interpretation  Date/Time:  Thursday September 14 2016 20:23:40 EDT Ventricular Rate:  80 PR Interval:    QRS Duration: 76 QT Interval:  365 QTC Calculation: 421 R Axis:   76 Text Interpretation:  Sinus rhythm Consider left atrial enlargement  No significant change since last tracing Confirmed by Orlie Dakin [phone]) on 09/14/2016 8:34:00 PM       Radiology Dg Chest 2 View  Result Date: 09/14/2016 CLINICAL DATA:  Shortness of breath EXAM: CHEST  2 VIEW COMPARISON:  Aug 16, 2016 FINDINGS: The heart size and mediastinal contours are within normal limits. There is minimal left pleural effusion. There is no focal pneumonia or pulmonary edema. The visualized skeletal structures are unremarkable. IMPRESSION: No focal pneumonia.  Minimal left pleural effusion. Electronically Signed   By: Abelardo Diesel M.D.   On: 09/14/2016 21:04    Procedures Procedures (including critical care time)  Medications Ordered in ED Medications  albuterol (PROVENTIL) (2.5 MG/3ML) 0.083% nebulizer solution 5 mg (not administered)  0.9 %  sodium chloride infusion (not administered)     Initial Impression / Assessment and Plan / ED Course  I have reviewed the triage vital signs and the nursing notes.  Pertinent labs & imaging results that were available during my care of the patient were reviewed by me and considered in my medical decision making (see chart for details).     Patient with evidence of gangrene on her left foot. Also suspicious for patient having pulmonary infection. Patient placed on antibiotics and will be admitted to the hospitalist service  Final  Clinical Impressions(s) / ED Diagnoses   Final diagnoses:  None    New Prescriptions New Prescriptions   No medications on file     Lacretia Leigh, MD 09/14/16 2313

## 2016-09-14 NOTE — Progress Notes (Addendum)
Pharmacy Antibiotic and Anticoagulation note  Shannon [name] is a 83 y.o. female admitted on 09/14/2016 with gangrene of toe of left foot. Pharmacy has been consulted for vancomycin and rocephin dosing. Patient has tolerated both PCN and cephalosporins in past. Patient presents with shortness of breath> on COPD pathway.    Plan:  Vancomycin 1gm IV q24h VT=15-20 mg/L  Follow renal function  Trough if remains on vanco  Rocephin 2 Gm IV q24h  Flagyl 500 mg IV q8h (MD)  INR=2.9 on 6/13, INR pending this admission.  Daily PT/INR     Temp (24hrs), Avg:99.1 F (37.3 C), Min:98.2 F (36.8 C), Max:100 F (37.8 C)   Recent Labs Lab 09/14/16 2140 09/14/16 2147  WBC 10.6*  --   LATICACIDVEN  --  0.87    CrCl cannot be calculated (Patient's most recent lab result is older than the maximum 21 days allowed.).    Allergies  Allergen Reactions  . Penicillins Itching, Rash and Other (See Comments)    Tolerated unasyn, zosyn & cephalosporins in the past.  Has patient had a PCN reaction causing immediate rash, facial/tongue/throat swelling, SOB or lightheadedness with hypotension: Yes Has patient had a PCN reaction causing severe rash involving mucus membranes or skin necrosis: Unk Has patient had a PCN reaction that required hospitalization: Unk Has patient had a PCN reaction occurring within the last 10 years: Unk If all of the above answers are "NO", then may proceed with Cephalosporins  . Other     Patient tolerates amoxicillin and cephalosporins    Antimicrobials this admission: 6/14 >> vanco >> 6/14 >> cefepime >> x1 6/15 >> rocephin >> 6/14 >> flagyl >>  Dose adjustments this admission:  Microbiology results: 6/14 BCx:  Thank you for allowing pharmacy to be a part of this patient's care.   Dorrene German 09/14/2016 10:18 PM

## 2016-09-15 ENCOUNTER — Encounter (HOSPITAL_COMMUNITY): Payer: Self-pay

## 2016-09-15 ENCOUNTER — Inpatient Hospital Stay (HOSPITAL_COMMUNITY): Payer: Medicare Other

## 2016-09-15 DIAGNOSIS — J9811 Atelectasis: Secondary | ICD-10-CM | POA: Diagnosis not present

## 2016-09-15 DIAGNOSIS — I70262 Atherosclerosis of native arteries of extremities with gangrene, left leg: Secondary | ICD-10-CM

## 2016-09-15 DIAGNOSIS — J9601 Acute respiratory failure with hypoxia: Secondary | ICD-10-CM | POA: Diagnosis not present

## 2016-09-15 DIAGNOSIS — I96 Gangrene, not elsewhere classified: Secondary | ICD-10-CM | POA: Diagnosis present

## 2016-09-15 DIAGNOSIS — E440 Moderate protein-calorie malnutrition: Secondary | ICD-10-CM | POA: Diagnosis not present

## 2016-09-15 DIAGNOSIS — L97529 Non-pressure chronic ulcer of other part of left foot with unspecified severity: Secondary | ICD-10-CM | POA: Diagnosis present

## 2016-09-15 DIAGNOSIS — D62 Acute posthemorrhagic anemia: Secondary | ICD-10-CM | POA: Diagnosis not present

## 2016-09-15 DIAGNOSIS — N183 Chronic kidney disease, stage 3 (moderate): Secondary | ICD-10-CM

## 2016-09-15 DIAGNOSIS — I10 Essential (primary) hypertension: Secondary | ICD-10-CM

## 2016-09-15 DIAGNOSIS — E1142 Type 2 diabetes mellitus with diabetic polyneuropathy: Secondary | ICD-10-CM | POA: Diagnosis present

## 2016-09-15 DIAGNOSIS — E1165 Type 2 diabetes mellitus with hyperglycemia: Secondary | ICD-10-CM | POA: Diagnosis present

## 2016-09-15 DIAGNOSIS — F4322 Adjustment disorder with anxiety: Secondary | ICD-10-CM | POA: Diagnosis present

## 2016-09-15 DIAGNOSIS — Z66 Do not resuscitate: Secondary | ICD-10-CM | POA: Diagnosis present

## 2016-09-15 DIAGNOSIS — F329 Major depressive disorder, single episode, unspecified: Secondary | ICD-10-CM | POA: Diagnosis present

## 2016-09-15 DIAGNOSIS — J441 Chronic obstructive pulmonary disease with (acute) exacerbation: Secondary | ICD-10-CM

## 2016-09-15 DIAGNOSIS — I5032 Chronic diastolic (congestive) heart failure: Secondary | ICD-10-CM | POA: Diagnosis not present

## 2016-09-15 DIAGNOSIS — J90 Pleural effusion, not elsewhere classified: Secondary | ICD-10-CM | POA: Diagnosis not present

## 2016-09-15 DIAGNOSIS — I481 Persistent atrial fibrillation: Secondary | ICD-10-CM | POA: Diagnosis not present

## 2016-09-15 DIAGNOSIS — K219 Gastro-esophageal reflux disease without esophagitis: Secondary | ICD-10-CM | POA: Diagnosis present

## 2016-09-15 DIAGNOSIS — E119 Type 2 diabetes mellitus without complications: Secondary | ICD-10-CM

## 2016-09-15 DIAGNOSIS — E1152 Type 2 diabetes mellitus with diabetic peripheral angiopathy with gangrene: Secondary | ICD-10-CM | POA: Diagnosis not present

## 2016-09-15 DIAGNOSIS — R05 Cough: Secondary | ICD-10-CM | POA: Diagnosis not present

## 2016-09-15 DIAGNOSIS — E11621 Type 2 diabetes mellitus with foot ulcer: Secondary | ICD-10-CM | POA: Diagnosis present

## 2016-09-15 DIAGNOSIS — E1122 Type 2 diabetes mellitus with diabetic chronic kidney disease: Secondary | ICD-10-CM | POA: Diagnosis present

## 2016-09-15 DIAGNOSIS — I739 Peripheral vascular disease, unspecified: Secondary | ICD-10-CM | POA: Diagnosis not present

## 2016-09-15 DIAGNOSIS — E871 Hypo-osmolality and hyponatremia: Secondary | ICD-10-CM

## 2016-09-15 DIAGNOSIS — J9690 Respiratory failure, unspecified, unspecified whether with hypoxia or hypercapnia: Secondary | ICD-10-CM | POA: Diagnosis not present

## 2016-09-15 DIAGNOSIS — E785 Hyperlipidemia, unspecified: Secondary | ICD-10-CM | POA: Diagnosis present

## 2016-09-15 DIAGNOSIS — I2510 Atherosclerotic heart disease of native coronary artery without angina pectoris: Secondary | ICD-10-CM | POA: Diagnosis present

## 2016-09-15 DIAGNOSIS — I130 Hypertensive heart and chronic kidney disease with heart failure and stage 1 through stage 4 chronic kidney disease, or unspecified chronic kidney disease: Secondary | ICD-10-CM | POA: Diagnosis not present

## 2016-09-15 DIAGNOSIS — E873 Alkalosis: Secondary | ICD-10-CM | POA: Diagnosis not present

## 2016-09-15 DIAGNOSIS — R0602 Shortness of breath: Secondary | ICD-10-CM | POA: Diagnosis not present

## 2016-09-15 DIAGNOSIS — E039 Hypothyroidism, unspecified: Secondary | ICD-10-CM | POA: Diagnosis not present

## 2016-09-15 LAB — TROPONIN I
Troponin I: <0.03 ng/mL (ref <0.03)
Troponin I: <0.03 ng/mL (ref <0.03)

## 2016-09-15 LAB — GLUCOSE, CAPILLARY
Glucose-Capillary: 360 mg/dL — ABNORMAL HIGH (ref 65–99)
Glucose-Capillary: 321 mg/dL — ABNORMAL HIGH (ref 65–99)
Glucose-Capillary: 248 mg/dL — ABNORMAL HIGH (ref 65–99)
Glucose-Capillary: 224 mg/dL — ABNORMAL HIGH (ref 65–99)

## 2016-09-15 LAB — SEDIMENTATION RATE: Sed Rate: 110 mm/hr — ABNORMAL HIGH (ref 0–22)

## 2016-09-15 LAB — PROTIME-INR
INR: 2.04
Prothrombin Time: 23.3 seconds — ABNORMAL HIGH (ref 11.4–15.2)

## 2016-09-15 LAB — D-DIMER, QUANTITATIVE: D-Dimer, Quant: 0.39 ug/mL-FEU (ref 0.00–0.50)

## 2016-09-15 LAB — PREALBUMIN: Prealbumin: 20.1 mg/dL (ref 18–38)

## 2016-09-15 LAB — MRSA PCR SCREENING: MRSA by PCR: NEGATIVE

## 2016-09-15 LAB — C-REACTIVE PROTEIN: CRP: 15.9 mg/dL — ABNORMAL HIGH (ref <1.0)

## 2016-09-15 MED ORDER — HYDRALAZINE HCL 25 MG PO TABS
25.0000 mg | ORAL_TABLET | Freq: Four times a day (QID) | ORAL | Status: DC | PRN
  Administered 2016-09-17 – 2016-09-22 (×6): 25 mg via ORAL
  Filled 2016-09-15 (×7): qty 1

## 2016-09-15 MED ORDER — INSULIN ASPART 100 UNIT/ML ~~LOC~~ SOLN
0.0000 [IU] | Freq: Three times a day (TID) | SUBCUTANEOUS | Status: DC
  Administered 2016-09-15: 15 [IU] via SUBCUTANEOUS
  Administered 2016-09-15 – 2016-09-16 (×4): 7 [IU] via SUBCUTANEOUS
  Administered 2016-09-17: 11 [IU] via SUBCUTANEOUS
  Administered 2016-09-17: 15 [IU] via SUBCUTANEOUS
  Administered 2016-09-17: 11 [IU] via SUBCUTANEOUS
  Administered 2016-09-18: 20 [IU] via SUBCUTANEOUS

## 2016-09-15 MED ORDER — CHLORHEXIDINE GLUCONATE 0.12 % MT SOLN
15.0000 mL | Freq: Two times a day (BID) | OROMUCOSAL | Status: DC
  Administered 2016-09-15 – 2016-09-20 (×12): 15 mL via OROMUCOSAL
  Filled 2016-09-15 (×12): qty 15

## 2016-09-15 MED ORDER — CYCLOSPORINE 0.05 % OP EMUL
1.0000 [drp] | Freq: Two times a day (BID) | OPHTHALMIC | Status: DC
  Administered 2016-09-15 – 2016-09-27 (×23): 1 [drp] via OPHTHALMIC
  Filled 2016-09-15 (×31): qty 1

## 2016-09-15 MED ORDER — PRO-STAT SUGAR FREE PO LIQD
30.0000 mL | Freq: Two times a day (BID) | ORAL | Status: DC
  Administered 2016-09-15 – 2016-09-20 (×11): 30 mL via ORAL
  Filled 2016-09-15 (×11): qty 30

## 2016-09-15 MED ORDER — LEVOTHYROXINE SODIUM 25 MCG PO TABS
137.0000 ug | ORAL_TABLET | Freq: Every day | ORAL | Status: DC
  Administered 2016-09-15 – 2016-09-27 (×13): 137 ug via ORAL
  Filled 2016-09-15 (×16): qty 1

## 2016-09-15 MED ORDER — NEPRO/CARBSTEADY PO LIQD
237.0000 mL | Freq: Two times a day (BID) | ORAL | Status: DC
  Administered 2016-09-15 – 2016-09-27 (×20): 237 mL via ORAL
  Filled 2016-09-15: qty 237

## 2016-09-15 MED ORDER — HEPARIN (PORCINE) IN NACL 100-0.45 UNIT/ML-% IJ SOLN
1300.0000 [IU]/h | INTRAMUSCULAR | Status: DC
  Administered 2016-09-15: 900 [IU]/h via INTRAVENOUS
  Administered 2016-09-16: 1100 [IU]/h via INTRAVENOUS
  Administered 2016-09-17: 1300 [IU]/h via INTRAVENOUS
  Filled 2016-09-15 (×3): qty 250

## 2016-09-15 MED ORDER — POLYETHYLENE GLYCOL 3350 17 G PO PACK: 17.0000 g | PACK | Freq: Every day | ORAL | Status: DC | PRN

## 2016-09-15 MED ORDER — ALBUTEROL SULFATE (2.5 MG/3ML) 0.083% IN NEBU
2.5000 mg | INHALATION_SOLUTION | Freq: Four times a day (QID) | RESPIRATORY_TRACT | Status: DC | PRN
  Administered 2016-09-15: 2.5 mg via RESPIRATORY_TRACT
  Filled 2016-09-15: qty 3

## 2016-09-15 MED ORDER — PREDNISONE 20 MG PO TABS
40.0000 mg | ORAL_TABLET | Freq: Every day | ORAL | Status: DC
  Administered 2016-09-15 – 2016-09-17 (×3): 40 mg via ORAL
  Filled 2016-09-15 (×3): qty 2

## 2016-09-15 MED ORDER — HYDRALAZINE HCL 20 MG/ML IJ SOLN
5.0000 mg | Freq: Four times a day (QID) | INTRAMUSCULAR | Status: DC | PRN
  Administered 2016-09-15: 5 mg via INTRAVENOUS
  Filled 2016-09-15: qty 1

## 2016-09-15 MED ORDER — IPRATROPIUM-ALBUTEROL 0.5-2.5 (3) MG/3ML IN SOLN
3.0000 mL | Freq: Four times a day (QID) | RESPIRATORY_TRACT | Status: DC | PRN
  Administered 2016-09-17: 3 mL via RESPIRATORY_TRACT
  Filled 2016-09-15: qty 3

## 2016-09-15 MED ORDER — ATORVASTATIN CALCIUM 10 MG PO TABS
10.0000 mg | ORAL_TABLET | Freq: Every day | ORAL | Status: DC
  Administered 2016-09-15 – 2016-09-26 (×12): 10 mg via ORAL
  Filled 2016-09-15 (×12): qty 1

## 2016-09-15 MED ORDER — IPRATROPIUM-ALBUTEROL 0.5-2.5 (3) MG/3ML IN SOLN
3.0000 mL | Freq: Four times a day (QID) | RESPIRATORY_TRACT | Status: DC
  Administered 2016-09-15 – 2016-09-16 (×7): 3 mL via RESPIRATORY_TRACT
  Filled 2016-09-15 (×7): qty 3

## 2016-09-15 MED ORDER — INSULIN ASPART 100 UNIT/ML ~~LOC~~ SOLN
0.0000 [IU] | Freq: Three times a day (TID) | SUBCUTANEOUS | Status: DC
  Administered 2016-09-15: 9 [IU] via SUBCUTANEOUS

## 2016-09-15 MED ORDER — FREE WATER
230.0000 mL | Freq: Two times a day (BID) | Status: DC
  Administered 2016-09-15 – 2016-09-27 (×23): 230 mL

## 2016-09-15 MED ORDER — FLUTICASONE PROPIONATE 50 MCG/ACT NA SUSP
1.0000 | Freq: Two times a day (BID) | NASAL | Status: DC
  Administered 2016-09-15 – 2016-09-27 (×21): 1 via NASAL
  Filled 2016-09-15 (×4): qty 16

## 2016-09-15 MED ORDER — POLYVINYL ALCOHOL 1.4 % OP SOLN
1.0000 [drp] | Freq: Four times a day (QID) | OPHTHALMIC | Status: DC
  Administered 2016-09-15 – 2016-09-27 (×43): 1 [drp] via OPHTHALMIC
  Filled 2016-09-15 (×3): qty 15

## 2016-09-15 MED ORDER — ACETAMINOPHEN 500 MG PO TABS
1000.0000 mg | ORAL_TABLET | Freq: Four times a day (QID) | ORAL | Status: DC | PRN
  Administered 2016-09-20: 1000 mg via ORAL
  Filled 2016-09-15: qty 2

## 2016-09-15 MED ORDER — LEVALBUTEROL TARTRATE 45 MCG/ACT IN AERO: 2.0000 | INHALATION_SPRAY | Freq: Four times a day (QID) | RESPIRATORY_TRACT | Status: DC | PRN

## 2016-09-15 MED ORDER — GABAPENTIN 300 MG PO CAPS
600.0000 mg | ORAL_CAPSULE | Freq: Three times a day (TID) | ORAL | Status: DC
  Administered 2016-09-15 – 2016-09-17 (×7): 600 mg via ORAL
  Filled 2016-09-15 (×7): qty 2

## 2016-09-15 MED ORDER — MORPHINE SULFATE (PF) 2 MG/ML IV SOLN
1.0000 mg | Freq: Once | INTRAVENOUS | Status: AC
  Administered 2016-09-15: 1 mg via INTRAVENOUS
  Filled 2016-09-15: qty 1

## 2016-09-15 MED ORDER — METHYLPREDNISOLONE SODIUM SUCC 40 MG IJ SOLR
40.0000 mg | Freq: Once | INTRAMUSCULAR | Status: AC
  Administered 2016-09-15: 40 mg via INTRAVENOUS
  Filled 2016-09-15: qty 1

## 2016-09-15 MED ORDER — ISOSORBIDE MONONITRATE ER 30 MG PO TB24
30.0000 mg | ORAL_TABLET | Freq: Every day | ORAL | Status: DC
  Administered 2016-09-15 – 2016-09-27 (×13): 30 mg via ORAL
  Filled 2016-09-15 (×13): qty 1

## 2016-09-15 MED ORDER — LATANOPROST 0.005 % OP SOLN
1.0000 [drp] | Freq: Every day | OPHTHALMIC | Status: DC
  Administered 2016-09-15 – 2016-09-26 (×11): 1 [drp] via OPHTHALMIC
  Filled 2016-09-15 (×5): qty 2.5

## 2016-09-15 MED ORDER — VANCOMYCIN HCL IN DEXTROSE 1-5 GM/200ML-% IV SOLN
1000.0000 mg | INTRAVENOUS | Status: DC
  Administered 2016-09-15 – 2016-09-16 (×2): 1000 mg via INTRAVENOUS
  Filled 2016-09-15 (×2): qty 200

## 2016-09-15 MED ORDER — CALCIUM CARBONATE-VITAMIN D 500-200 MG-UNIT PO TABS
1.0000 | ORAL_TABLET | Freq: Every day | ORAL | Status: DC
  Administered 2016-09-15 – 2016-09-27 (×12): 1 via ORAL
  Filled 2016-09-15 (×12): qty 1

## 2016-09-15 MED ORDER — RESOURCE THICKENUP CLEAR PO POWD
1.0000 | ORAL | Status: DC | PRN
  Filled 2016-09-15 (×3): qty 125

## 2016-09-15 MED ORDER — IBUPROFEN 200 MG PO TABS
400.0000 mg | ORAL_TABLET | Freq: Once | ORAL | Status: AC
  Administered 2016-09-15: 400 mg via ORAL
  Filled 2016-09-15: qty 2

## 2016-09-15 MED ORDER — NYSTATIN 100000 UNIT/GM EX POWD
Freq: Three times a day (TID) | CUTANEOUS | Status: DC
  Administered 2016-09-15 – 2016-09-18 (×11): via TOPICAL
  Administered 2016-09-19: 1 g via TOPICAL
  Administered 2016-09-19 – 2016-09-27 (×14): via TOPICAL
  Filled 2016-09-15 (×3): qty 15

## 2016-09-15 MED ORDER — ADULT MULTIVITAMIN W/MINERALS CH
1.0000 | ORAL_TABLET | Freq: Every day | ORAL | Status: DC
  Administered 2016-09-15 – 2016-09-27 (×12): 1 via ORAL
  Filled 2016-09-15 (×12): qty 1

## 2016-09-15 MED ORDER — OSMOLITE 1 CAL PO LIQD
237.0000 mL | Freq: Every day | ORAL | Status: DC
  Filled 2016-09-15: qty 237

## 2016-09-15 MED ORDER — CARVEDILOL 6.25 MG PO TABS
6.2500 mg | ORAL_TABLET | Freq: Two times a day (BID) | ORAL | Status: DC
  Administered 2016-09-15 – 2016-09-20 (×13): 6.25 mg via ORAL
  Filled 2016-09-15 (×13): qty 1

## 2016-09-15 MED ORDER — SERTRALINE HCL 100 MG PO TABS
100.0000 mg | ORAL_TABLET | Freq: Every day | ORAL | Status: DC
  Administered 2016-09-15 – 2016-09-27 (×13): 100 mg via ORAL
  Filled 2016-09-15: qty 2
  Filled 2016-09-15 (×2): qty 1
  Filled 2016-09-15: qty 2
  Filled 2016-09-15 (×3): qty 1
  Filled 2016-09-15: qty 2
  Filled 2016-09-15 (×5): qty 1

## 2016-09-15 MED ORDER — FREE WATER
230.0000 mL | Freq: Three times a day (TID) | Status: DC
  Administered 2016-09-15: 230 mL

## 2016-09-15 MED ORDER — BENZONATATE 100 MG PO CAPS
200.0000 mg | ORAL_CAPSULE | Freq: Three times a day (TID) | ORAL | Status: DC | PRN
  Administered 2016-09-18 – 2016-09-22 (×3): 200 mg via ORAL
  Filled 2016-09-15 (×3): qty 2

## 2016-09-15 MED ORDER — OSMOLITE 1.2 CAL PO LIQD
237.0000 mL | Freq: Every day | ORAL | Status: DC
  Administered 2016-09-15 (×2): 237 mL
  Filled 2016-09-15 (×2): qty 237

## 2016-09-15 MED ORDER — DEXTROSE 5 % IV SOLN
2.0000 g | INTRAVENOUS | Status: DC
  Administered 2016-09-15 – 2016-09-16 (×2): 2 g via INTRAVENOUS
  Filled 2016-09-15 (×3): qty 2

## 2016-09-15 NOTE — Progress Notes (Signed)
Inpatient Diabetes Program Recommendations  AACE/ADA: New Consensus Statement on Inpatient Glycemic Control (2015)  Target Ranges:  Prepandial:   less than 140 mg/dL      Peak postprandial:   less than 180 mg/dL (1-2 hours)      Critically ill patients:  140 - 180 mg/dL   Results for Shannon [name], Shannon [name] (MRN [id number]) as of 09/15/2016 09:17  Ref. Range 09/15/2016 07:22  Glucose-Capillary Latest Ref Range: 65 - 99 mg/dL 360 (H)  9 units Novolog    Admit: SOB/ Gangrene L Toe  History: DM, CKD3, CHF  Home DM Meds: Novolog 2-10 units TID per SSI  Current Orders: Novolog Sensitive Correction Scale/ SSI (0-9 units) TID AC      MD- Note patient received 40 mg IV Solumedrol X 1 dose at 3am today for CP/ SOB.  CBG rose to 360 mg/dl by 8am today likely due to steroids on board.  Most recent Hemoglobin A1c on file was 7.7% taken on 08/16/16 (good control at home).      --Will follow patient during hospitalization--  Wyn Quaker RN, MSN, CDE Diabetes Coordinator Inpatient Glycemic Control Team Team Pager: [phone] (8a-5p)

## 2016-09-15 NOTE — Significant Event (Signed)
Rapid Response Event Note  Overview: Time Called: 0236 Arrival Time: 0241 Event Type: Respiratory/Cardiac  Initial Focused Assessment: Called to pt room w/ c/o persistent L-sided chest pain, increased WOB, SOB, unable to speak more than 1 word at a time. Elevated BP, EKG neg, HR NSR 80's-90's. HTN. 0258: 193/80 (111) 0313: 185/79 (108) 0318: 161/69 (95)  Interventions: 12-lead EKG, breathing tx, rechecked troponin, VS, morphine, hydralazine, solu-medrol.   Plan of Care (if not transferred): VSS, BP 161/69, decreased WOB, back on 6L O2. Pt reported decreased chest pain from 10/10 to 5/10. Hydralizine ordered q6 for BP >170.   Event Summary: Name of Physician Notified: Baltazar Najjar at 62  Outcome: Stayed in room and stabalized  Event End Time: 0330  Guadalupe Dawn, RN Jennye Moccasin, RN

## 2016-09-15 NOTE — Progress Notes (Signed)
Initial Nutrition Assessment  DOCUMENTATION CODES:   Non-severe (moderate) malnutrition in context of chronic illness  INTERVENTION:   Home tube feed regimen via PEG- Osmolite 1.5 (3 cans)- nocturnal feeds at 63m/hr from 9pm-9am  Prostat liquid protein PO 30 ml BID via PEG, each supplement provides 100 kcal, 15 grams protein.  Free water flushes 2317mBID via PEG per MD  Tube feed regimen provides- 1265kcal/day, 75g/day protein, 100371mree water   Nepro Shake po BID , each supplement provides 425 kcal and 19 grams protein  MVI  NUTRITION DIAGNOSIS:   Malnutrition (moderate) related to chronic illness (CKD, COPD, CHF, DM, advanced age) as evidenced by moderate depletions of muscle mass, moderate depletion of body fat.  GOAL:   Patient will meet greater than or equal to 90% of their needs  MONITOR:   PO intake, Supplement acceptance, Labs, Weight trends, I & O's, Skin  REASON FOR ASSESSMENT:   Consult Wound healing  ASSESSMENT:   [age]o. female with medical history significant of DM2, COPD, HTN, CKD stage 3, CHF, diabetic foot ulcers.    Met with pt and pt's daughter in room today. Pt reports that she had an gastric outflow obstruction that led to PEJ placement in 09/2015. Pt reports that eventually her PEJ was changed over to a PEG tube. Pt's home regimen of tube feeds is Osmolite 1.5(3 cans) at 68m36m from 9p-9a. Patient also takes 2 Prostat supplements per day and drinks either 1-2 Glucerna or Nepros. (pt prefers Nepro). Pt remains on a nectar thick, full liquid diet. Pt reports that she was tolerating a puree diet at one point but had to eventually downgrade to full liquids. Pt has struggled with hyperglycemia; she checks her BGs twice a day and reports they are usually in the 200's. Pt takes insulin at home. Pt has requested Glucerna 1.2 but there is some concern as to whether or not pt would be able to tolerate high amounts of fiber. Pt reports that she has been slowly  losing weight since transitioning from a puree diet over to a full liquid diet. Per chart, pt has multiple weight fluctuations r/t CHF and edema but appears to be weight stable since 06/2016. Pt has necrotic toe wound; [facility].   Medications reviewed and include: Ca-Vit D, insulin, synthroid, MVI, prednisone, ceftriaxone, metronidazole, vancomycin  Labs reviewed: Na 129(L), Cl 94(L), BUN 38(H), creat 1.02(H)- labs from 6/14 Wbc- 10.6(H) Glucose 154 AIC 7.7(H)- 5/16  Nutrition-Focused physical exam completed. Findings are moderate fat depletion in orbital regions, chest, and upper arms, moderate to severe muscle depletions over entire body, and mild edema in LLE.   Diet Order:  Diet full liquid Room service appropriate? Yes; Fluid consistency: Nectar Thick  Skin:  Wound (see comment) (necrotic toe wound)  Last BM:  6/15  Height:   Ht Readings from Last 1 Encounters:  09/15/16 '5\' 6"'  (1.676 m)    Weight:   Wt Readings from Last 1 Encounters:  09/15/16 140 lb 3.4 oz (63.6 kg)    Ideal Body Weight:  59 kg  BMI:  Body mass index is 22.63 kg/m.  Estimated Nutritional Needs:   Kcal:  1600-1900kcal/day   Protein:  95-108g/day   Fluid:  >1.7L/day   EDUCATION NEEDS:   Education needs addressed  CaseKoleen Distance RD, LDN Pager #- [phone]

## 2016-09-15 NOTE — Progress Notes (Signed)
[facility] for Heparin Indication: h/o DVT  Allergies  Allergen Reactions  . Penicillins Itching, Rash and Other (See Comments)    Tolerated unasyn, zosyn & cephalosporins in the past.  Has patient had a PCN reaction causing immediate rash, facial/tongue/throat swelling, SOB or lightheadedness with hypotension: Yes Has patient had a PCN reaction causing severe rash involving mucus membranes or skin necrosis: Unk Has patient had a PCN reaction that required hospitalization: Unk Has patient had a PCN reaction occurring within the last 10 years: Unk If all of the above answers are "NO", then may proceed with Cephalosporins  . Other     Patient tolerates amoxicillin and cephalosporins    Patient Measurements: Height: 5\' 6"  (167.6 cm) Weight: 140 lb 3.4 oz (63.6 kg) IBW/kg (Calculated) : 59.3  Vital Signs: Temp: 97.9 F (36.6 C) (06/15 0212) Temp Source: Axillary (06/15 0212) BP: 193/80 (06/15 0337) Pulse Rate: 96 (06/15 0337)  Labs:  Recent Labs  09/13/16 1208 09/14/16 2140 09/15/16 0745 09/15/16 0752  HGB  --  10.6*  --   --   HCT  --  31.0*  --   --   PLT  --  342  --   --   LABPROT  --   --   --  23.3*  INR 2.9  --   --  2.04  CREATININE  --  1.02*  --   --   TROPONINI  --  0.03* <0.03  --     Estimated Creatinine Clearance: 33.6 mL/min (A) (by C-G formula based on SCr of 1.02 mg/dL (H)).   Medical History: Past Medical History:  Diagnosis Date  . Anemia    previous blood transfusions  . Arthritis    "all over"  . Asthma   . Bradycardia    requiring previous d/c of BB and reduction of amiodarone  . CAD (coronary artery disease)    nonobstructive per notes  . Chronic diastolic CHF (congestive heart failure) ([place])   . CKD (chronic kidney disease), stage III   . Complication of blood transfusion    "got the wrong blood type at [facility] in ~ 2015; no adverse reaction that we are aware of"/daughter, [name] (01/27/2016)   . COPD (chronic obstructive pulmonary disease) ([place])   . Depression    "light case"  . DVT (deep venous thrombosis) ([place]) 01/2016   a. LLE DVT 01/2016 - switched from Eliquis to Coumadin.  . Gastric stenosis    a. s/p stomach tube  . GERD (gastroesophageal reflux disease)   . History of blood transfusion    "several" (01/27/2016)  . History of stomach ulcers   . Hyperlipidemia   . Hypertension   . Hypothyroidism   . Paraesophageal hernia   . Perforated gastric ulcer ([place])   . Seasonal allergies   . SIADH (syndrome of inappropriate ADH production) ([place])    [name] 01/10/2015  . Small bowel obstruction ([place])    "I don't know how many" (01/11/2015)  . Stroke ([place])    "light one"  . Type II diabetes mellitus ([place])    "related to prednisone use  for > 20 yr; once predinose stopped; no more DM RX" (01/27/2016)  . Ventral hernia with bowel obstruction     Medications:  Scheduled:  . atorvastatin  10 mg Oral q1800  . calcium-vitamin D  1 tablet Oral Q breakfast  . carvedilol  6.25 mg Oral BID  . chlorhexidine  15 mL Mouth/Throat BID  .  cycloSPORINE  1 drop Both Eyes BID  . feeding supplement (OSMOLITE 1.2 CAL)  237 mL Per Tube QHS  . fluticasone  1 spray Each Nare BID  . free water  230 mL Per Tube TID  . gabapentin  600 mg Oral TID  . insulin aspart  0-9 Units Subcutaneous TID WC  . ipratropium-albuterol  3 mL Nebulization QID  . isosorbide mononitrate  30 mg Oral Daily  . latanoprost  1 drop Both Eyes QHS  . levothyroxine  137 mcg Oral QAC breakfast  . multivitamin with minerals  1 tablet Oral Daily  . polyvinyl alcohol  1 drop Both Eyes QID  . predniSONE  40 mg Oral Q breakfast  . sertraline  100 mg Oral Daily   Infusions:  . sodium chloride 1 mL/hr (09/14/16 2218)  . cefTRIAXone (ROCEPHIN)  IV 2 g (09/15/16 1034)  . metronidazole Stopped (09/15/16 0229)  . vancomycin      Assessment: Patient is a 83 yo female oh chronic warfarin for h/o DVT was admitted 6/15 with  shortness of breath and gangrene on left toe. Last dose of warfarin outpatient 6/13 (INR 2.9 on 6/13), and did not get dose on 6/14 evening. Given possible procedure/amputation for toe infection, hold warfarin and initiate heparin drip when INR subtherapeutic.  PTA warfarin dose: 3.75mg /day except 2.5mg  Mon (25mg /wk)  INR is therapeutic today at 2.04, which is a decrease from 6/13 likely after missed dose. Hemoglobin low/stable with no notes of bleeding.  Goal of Therapy:  Heparin Level 0.3-0.7 Monitor platelets by anticoagulation protocol: Yes   Plan:  Hold warfarin tonight  Initiate heparin gtt when INR < 2, no bolus Daily INR/CBC ordered  F/U podiatry plans regarding procedure  Demetrius Charity, PharmD Acute Care Pharmacy Resident  Pager: [phone] 09/15/2016

## 2016-09-15 NOTE — Progress Notes (Addendum)
[facility]  PROGRESS NOTE  Shannon [name] HQI:[id number] DOB: 1924-03-02 DOA: 09/14/2016 PCP: Kirt Boys, DO   Brief HPI:    83 y.o. female with medical history significant of DM2, COPD, HTN, CKD stage 3, CHF, diabetic foot ulcers.  Patient presents to ED with c/o cough and SOB onset several days ago.  Symptoms worsening.  Not improved with home nebs.  Developed resp distress today with pulse ox of 72% on RA at home per EMS   Subjective   Patient seen and examined, breathing has improved.   Assessment/Plan:     1. COPD exacerbation- Breathing has improved, continue prednisone 40 mg daily, DuoNeb nebulizers every 6 hours. 2. Gangrene of second toe left foot- patient will need amputation of the gangrenous second toe left foot. Patient was seen by vascular surgery in April 2018 at that time she refused surgery. At this time daughter and patient both agreed for surgery. Will consult vascular  surgery for amputation.Patient is currently on antibiotics vancomycin, Rocephin, Flagyl for infected second toe left foot. 3. Hypertension- blood pressure is elevated, continue Coreg 6.25 mg twice a day. Will start hydralazine 25 mg every 6 hours when necessary for BP more than 160/100.  4. Hyponatremia- sodium is 129, we could down the free water from 230 mL 3 times a day to 230 mL twice a day with tube feeding. Follow BMP in a.m.  5. Chronic diastolic CHF- compensated at this time.  6. Diabetes mellitus- patient is on prednisone, will switch sliding scale  resistant as  the glucose is elevated  7. Chronic kidney disease stage III- creatinine is at baseline.    Discussed with Dr. Gretta Began, and he recommends patient with transfer to [facility] for amputation. Will transfer patient to [facility]. Discussed with  Dr. Katrinka Blazing, who has accepted the patient.  DVT prophylaxis: On coumadin  Code Status: DNR  Family Communication: No family at bedside  Disposition Plan: Pending  improvement in multiple medical problems   Consultants:    Procedures:    Continuous infusions . sodium chloride 1 mL/hr (09/14/16 2218)  . cefTRIAXone (ROCEPHIN)  IV 2 g (09/15/16 1034)  . metronidazole Stopped (09/15/16 0229)  . vancomycin        Antibiotics:   Anti-infectives    Start     Dose/Rate Route Frequency Ordered Stop   09/15/16 2200  vancomycin (VANCOCIN) IVPB 1000 mg/200 mL premix     1,000 mg 200 mL/hr over 60 Minutes Intravenous Every 24 hours 09/15/16 0000     09/15/16 1000  cefTRIAXone (ROCEPHIN) 2 g in dextrose 5 % 50 mL IVPB     2 g 100 mL/hr over 30 Minutes Intravenous Every 24 hours 09/15/16 0122     09/15/16 0000  metroNIDAZOLE (FLAGYL) IVPB 500 mg     500 mg 100 mL/hr over 60 Minutes Intravenous Every 8 hours 09/14/16 2350     09/14/16 2230  ceFEPIme (MAXIPIME) 2 g in dextrose 5 % 50 mL IVPB     2 g 100 mL/hr over 30 Minutes Intravenous NOW 09/14/16 2215 09/15/16 0128   09/14/16 2145  levofloxacin (LEVAQUIN) IVPB 750 mg  Status:  Discontinued     750 mg 100 mL/hr over 90 Minutes Intravenous  Once 09/14/16 2138 09/14/16 2215   09/14/16 2145  vancomycin (VANCOCIN) IVPB 1000 mg/200 mL premix     1,000 mg 200 mL/hr over 60 Minutes Intravenous  Once 09/14/16 2138 09/15/16 0046       Objective  Vitals:   09/15/16 0212 09/15/16 0303 09/15/16 0337 09/15/16 0744  BP: (!) 173/70  (!) 193/80   Pulse: 91  96   Resp: (!) 24  (!) 24   Temp: 97.9 F (36.6 C)     TempSrc: Axillary     SpO2: 91% 97% 97% 96%  Weight: 63.6 kg (140 lb 3.4 oz)     Height: 5\' 6"  (1.676 m)       Intake/Output Summary (Last 24 hours) at 09/15/16 1046 Last data filed at 09/15/16 0800  Gross per 24 hour  Intake            206.4 ml  Output                0 ml  Net            206.4 ml   Filed Weights   09/15/16 0212  Weight: 63.6 kg (140 lb 3.4 oz)     Physical Examination:   Physical Exam: Eyes: No icterus, extraocular muscles intact  Mouth: Oral mucosa is  moist, no lesions on palate,  Neck: Supple, no deformities, masses, or tenderness Lungs: Normal respiratory effort, bilateral wheezing  Heart: Regular rate and rhythm, S1 and S2 normal, no murmurs, rubs auscultated Abdomen: BS normoactive,soft,nondistended,non-tender to palpation,no organomegaly Extremities: No pretibial edema, no erythema, no cyanosis, no clubbing Neuro : Alert and oriented to time, place and person, No focal deficits Skin: Left second toe is gangrenous, foul smelling. With a     Data Reviewed: I have personally reviewed following labs and imaging studies  CBG:  Recent Labs Lab 09/15/16 0722  GLUCAP 360*    CBC:  Recent Labs Lab 09/14/16 2140  WBC 10.6*  NEUTROABS 9.3*  HGB 10.6*  HCT 31.0*  MCV 85.9  PLT 342    Basic Metabolic Panel:  Recent Labs Lab 09/14/16 2140  NA 129*  K 4.3  CL 94*  CO2 24  GLUCOSE 154*  BUN 38*  CREATININE 1.02*  CALCIUM 9.2     Cardiac Enzymes:  Recent Labs Lab 09/14/16 2140 09/15/16 0745  TROPONINI 0.03* <0.03   BNP (last 3 results)  Recent Labs  06/08/16 1523 08/12/16 1400 09/14/16 2140  BNP 1,268.6* 92.2 226.1*    ProBNP (last 3 results)  Recent Labs  05/08/16 1053  PROBNP 1,132*      Studies: Dg Chest 2 View  Result Date: 09/14/2016 CLINICAL DATA:  Shortness of breath EXAM: CHEST  2 VIEW COMPARISON:  Aug 16, 2016 FINDINGS: The heart size and mediastinal contours are within normal limits. There is minimal left pleural effusion. There is no focal pneumonia or pulmonary edema. The visualized skeletal structures are unremarkable. IMPRESSION: No focal pneumonia.  Minimal left pleural effusion. Electronically Signed   By: Sherian Rein M.D.   On: 09/14/2016 21:04    Scheduled Meds: . atorvastatin  10 mg Oral q1800  . calcium-vitamin D  1 tablet Oral Q breakfast  . carvedilol  6.25 mg Oral BID  . chlorhexidine  15 mL Mouth/Throat BID  . cycloSPORINE  1 drop Both Eyes BID  . feeding  supplement (OSMOLITE 1.2 CAL)  237 mL Per Tube QHS  . fluticasone  1 spray Each Nare BID  . free water  230 mL Per Tube TID  . gabapentin  600 mg Oral TID  . insulin aspart  0-9 Units Subcutaneous TID WC  . ipratropium-albuterol  3 mL Nebulization QID  . isosorbide mononitrate  30 mg Oral Daily  .  latanoprost  1 drop Both Eyes QHS  . levothyroxine  137 mcg Oral QAC breakfast  . multivitamin with minerals  1 tablet Oral Daily  . polyvinyl alcohol  1 drop Both Eyes QID  . predniSONE  40 mg Oral Q breakfast  . sertraline  100 mg Oral Daily      Time spent: 25 min  [facility]   [facility] Pager [phone]. If 7PM-7AM, please contact night-coverage at [email or web address], Office  [phone]  password [email or web address] 09/15/2016, 10:46 AM  LOS: 0 days

## 2016-09-15 NOTE — Telephone Encounter (Signed)
Mrs. Tasia Catchings called back and stated that the dietitian/nurtitionist at the hospital recommends that pt does not use glucerna nutritional supplements due to the amount of fiber that would be flowing through pt's G tube, which could cause blockage. Mrs. Tasia Catchings would like to know if there is another supplement that can be recommended.  Please advise.

## 2016-09-15 NOTE — Consult Note (Signed)
[facility] Nurse wound consult note Reason for Consult: Necrotic toe on left foot, 2nd toe  Wound type: arterial Pressure Injury POA: na Measurement: 3cm and the full circumference Wound KJZ:PHXTA necrotic Drainage (amount, consistency, odor) no drainage or odor presently, ED noted odor Periwound: dry skin Dressing procedure/placement/frequency: I have provided nurses with orders as follows: To 2nd toe on left foot, cleanse with NS, pat dry, wrap toe with Xeroform, cover with dry gauze and adhere, perform daily.  Toe is in process of auto-amputating.  MRI in April showed possible osteomyelitis. Patient may benefit from Vascular or Ortho consult.  Please order if agree.  At this time topical treatment completed and scheduled for daily changes. We will not follow, but will remain available to this patient, to nursing, and the medical and/or surgical teams.  Please re-consult if we need to assist further.    Fara Olden, RN-C, WTA-C Wound Treatment Associate

## 2016-09-15 NOTE — Telephone Encounter (Signed)
Again, will wait to see what the hospital recommends at discharge regarding her TF.

## 2016-09-15 NOTE — Progress Notes (Signed)
PT Cancellation Note  Patient Details Name: Shannon [name] MRN: [id number] DOB: 01/01/1924   Cancelled Treatment:    Reason Eval/Treat Not Completed: Medical issues which prohibited therapy; rapid response called early this am-->pt with persistent L-sided chest pain, increased WOB, SOB, unable to speak more than 1 word at a time. Elevated BP--will defer PT eval at this time and check back later today/next day/or as schedule permits   [facility] 09/15/2016, 8:53 AM

## 2016-09-15 NOTE — Telephone Encounter (Signed)
According to her last d/c summary, she is now getting glucerna. This may change as she is now in the hospital. Recommend we wait to fax order until she is d/c'd

## 2016-09-15 NOTE — H&P (Signed)
History and Physical    Shannon [name] ZOX:[id number] DOB: 06-15-1923 DOA: 09/14/2016  PCP: Kirt Boys, DO  Patient coming from: Home  I have personally briefly reviewed patient's old medical records in [facility]  Chief Complaint: SOB, cough  HPI: Shannon [name] is a 83 y.o. female with medical history significant of DM2, COPD, HTN, CKD stage 3, CHF, diabetic foot ulcers.  Patient presents to ED with c/o cough and SOB onset several days ago.  Symptoms worsening.  Not improved with home nebs.  Developed resp distress today with pulse ox of 72% on RA at home per EMS.  Given albuterol and solumedrol and brought to ED.   ED Course: Tm 100.0, sodium 129, WBC 10.6.  Breathing improved after neb treatments and solumedrol.  CXR neg.  Patient also noted to have gangrene of 2nd toe of L foot with purulent and foul smelling discharge.  Started on cefepime / vanc by EDP.   Review of Systems: As per HPI otherwise 10 point review of systems negative.   Past Medical History:  Diagnosis Date  . Anemia    previous blood transfusions  . Arthritis    "all over"  . Asthma   . Bradycardia    requiring previous d/c of BB and reduction of amiodarone  . CAD (coronary artery disease)    nonobstructive per notes  . Chronic diastolic CHF (congestive heart failure) (HCC)   . CKD (chronic kidney disease), stage III   . Complication of blood transfusion    "got the wrong blood type at [facility] in ~ 2015; no adverse reaction that we are aware of"/daughter, [name] (01/27/2016)  . COPD (chronic obstructive pulmonary disease) (HCC)   . Depression    "light case"  . DVT (deep venous thrombosis) (HCC) 01/2016   a. LLE DVT 01/2016 - switched from Eliquis to Coumadin.  . Gastric stenosis    a. s/p stomach tube  . GERD (gastroesophageal reflux disease)   . History of blood transfusion    "several" (01/27/2016)  . History of stomach ulcers   . Hyperlipidemia   . Hypertension   . Hypothyroidism   .  Paraesophageal hernia   . Perforated gastric ulcer (HCC)   . Seasonal allergies   . SIADH (syndrome of inappropriate ADH production) (HCC)    [name] 01/10/2015  . Small bowel obstruction (HCC)    "I don't know how many" (01/11/2015)  . Stroke (HCC)    "light one"  . Type II diabetes mellitus (HCC)    "related to prednisone use  for > 20 yr; once predinose stopped; no more DM RX" (01/27/2016)  . Ventral hernia with bowel obstruction     Past Surgical History:  Procedure Laterality Date  . CATARACT EXTRACTION W/ INTRAOCULAR LENS  IMPLANT, BILATERAL    . CHOLECYSTECTOMY OPEN    . COLECTOMY    . ESOPHAGOGASTRODUODENOSCOPY N/A 01/19/2014   Procedure: ESOPHAGOGASTRODUODENOSCOPY (EGD);  Surgeon: Hilarie Fredrickson, MD;  Location: [facility];  Service: Endoscopy;  Laterality: N/A;  . ESOPHAGOGASTRODUODENOSCOPY N/A 01/20/2014   Procedure: ESOPHAGOGASTRODUODENOSCOPY (EGD);  Surgeon: Hilarie Fredrickson, MD;  Location: [facility]OSCOPY;  Service: Endoscopy;  Laterality: N/A;  . ESOPHAGOGASTRODUODENOSCOPY N/A 03/19/2014   Procedure: ESOPHAGOGASTRODUODENOSCOPY (EGD);  Surgeon: Rachael Fee, MD;  Location: [facility];  Service: Endoscopy;  Laterality: N/A;  . ESOPHAGOGASTRODUODENOSCOPY N/A 07/08/2015   Procedure: ESOPHAGOGASTRODUODENOSCOPY (EGD);  Surgeon: Sherrilyn Rist, MD;  Location: [facility] ENDOSCOPY;  Service: Endoscopy;  Laterality: N/A;  . ESOPHAGOGASTRODUODENOSCOPY (EGD)  WITH PROPOFOL N/A 09/15/2015   Procedure: ESOPHAGOGASTRODUODENOSCOPY (EGD) WITH PROPOFOL;  Surgeon: Ruffin Frederick, MD;  Location: [facility] ENDOSCOPY;  Service: Gastroenterology;  Laterality: N/A;  . GASTROJEJUNOSTOMY     hx/notes 01/10/2015  . GASTROJEJUNOSTOMY N/A 09/23/2015   Procedure: OPEN GASTROJEJUNOSTOMY TUBE PLACEMENT;  Surgeon: De Blanch Kinsinger, MD;  Location: [facility] ORS;  Service: General;  Laterality: N/A;  . GLAUCOMA SURGERY Bilateral   . HERNIA REPAIR  2015  . IR GENERIC HISTORICAL  01/07/2016   IR GJ TUBE CHANGE 01/07/2016  Malachy Moan, MD [facility]  . IR GENERIC HISTORICAL  01/27/2016   IR MECH REMOV OBSTRUC MAT ANY COLON TUBE W/FLUORO 01/27/2016 Richarda Overlie, MD [facility]  . IR GENERIC HISTORICAL  02/07/2016   IR PATIENT EVAL TECH 0-60 MINS Darrell K Allred, PA-C [facility]  . IR GENERIC HISTORICAL  02/08/2016   IR GJ TUBE CHANGE 02/08/2016 Berdine Dance, MD [facility]  . IR GENERIC HISTORICAL  01/06/2016   IR GJ TUBE CHANGE 01/06/2016 [facility]-RAD OUT REF  . IR GENERIC HISTORICAL  05/02/2016   IR CM INJ ANY COLONIC TUBE W/FLUORO 05/02/2016 Oley Balm, MD [facility]  . IR GENERIC HISTORICAL  05/15/2016   IR GJ TUBE CHANGE 05/15/2016 Simonne Come, MD [facility]  . IR GENERIC HISTORICAL  06/28/2016   IR GJ TUBE CHANGE 06/28/2016 [facility]  . LAPAROTOMY N/A 01/20/2015   Procedure: EXPLORATORY LAPAROTOMY;  Surgeon: Abigail Miyamoto, MD;  Location: [facility] OR;  Service: General;  Laterality: N/A;  . LYSIS OF ADHESION N/A 01/20/2015   Procedure: LYSIS OF ADHESIONS < 1 HOUR;  Surgeon: Abigail Miyamoto, MD;  Location: [facility] OR;  Service: General;  Laterality: N/A;  . TONSILLECTOMY    . TUBAL LIGATION    . VENTRAL HERNIA REPAIR  2015   incarcerated ventral hernia ([facility] 09/2013)/notes 01/10/2015     reports that she has never smoked. She has quit using smokeless tobacco. Her smokeless tobacco use included Snuff. She reports that she does not drink alcohol or use drugs.  Allergies  Allergen Reactions  . Penicillins Itching, Rash and Other (See Comments)    Tolerated unasyn, zosyn & cephalosporins in the past.  Has patient had a PCN reaction causing immediate rash, facial/tongue/throat swelling, SOB or lightheadedness with hypotension: Yes Has patient had a PCN reaction causing severe rash involving mucus membranes or skin necrosis: Unk Has patient had a PCN reaction that required hospitalization: Unk Has patient had a PCN reaction occurring within the last 10 years: Unk If all of the above answers are "NO",  then may proceed with Cephalosporins  . Other     Patient tolerates amoxicillin and cephalosporins    Family History  Problem Relation Age of Onset  . Stroke Mother   . Hypertension Mother   . Diabetes Brother   . Heart attack Neg Hx     Prior to Admission medications   Medication Sig Start Date End Date Taking? Authorizing Provider  AMINO ACIDS-PROTEIN HYDROLYS PO Take 30 mLs by mouth 2 (two) times daily.   Yes [provider]  atorvastatin (LIPITOR) 10 MG tablet TAKE ONE TABLET BY MOUTH ONCE DAILY 05/22/16  Yes Kirt Boys, DO  b complex vitamins tablet Take 1 tablet by mouth daily.    Yes [provider]  benzonatate (TESSALON PERLES) 100 MG capsule Take two capsules by mouth three times daily as needed for cough 09/13/16  Yes Montez Morita, [place], DO  calcium-vitamin D (OSCAL WITH D) 500-200 MG-UNIT per tablet  Take 1 tablet by mouth daily with breakfast.    Yes [provider]  carvedilol (COREG) 6.25 MG tablet Take 1 tablet (6.25 mg total) by mouth 2 (two) times daily. 08/18/16 09/17/16 Yes Albertine Grates, MD  chlorhexidine (PERIDEX) 0.12 % solution Use as directed 15 mLs in the mouth or throat 2 (two) times daily.   Yes [provider]  feeding supplement (OSMOLITE 1 CAL) LIQD Take 237 mLs by mouth at bedtime.   Yes [provider]  fluticasone (FLONASE) 50 MCG/ACT nasal spray Place 2 sprays into both nostrils daily. Patient taking differently: Place 1 spray into both nostrils 2 (two) times daily.  08/07/14  Yes Montez Morita, [place], DO  furosemide (LASIX) 20 MG tablet Take 1 tablet (20 mg total) by mouth daily. 08/07/16  Yes Sharon Seller, NP  gabapentin (NEURONTIN) 300 MG capsule 1 tablet by mouth three times daily, may have additional dose every 8 hours as needed pain Patient taking differently: Take 600 mg by mouth 3 (three) times daily.  08/07/16  Yes Sharon Seller, NP  insulin aspart (NOVOLOG FLEXPEN) 100 UNIT/ML FlexPen Before each meal 3 times a  day, 140-199 - 2 units, 200-250 - 4 units, 251-299 - 6 units,  300-349 - 8 units,  350 or above 10 units. Insulin PEN if approved, provide syringes and needles if needed. 08/18/16  Yes Albertine Grates, MD  ipratropium-albuterol (DUONEB) 0.5-2.5 (3) MG/3ML SOLN Take 3 mLs by nebulization every 6 (six) hours as needed (sob). Use 3 times daily x 4 days then every 6 hours as needed. 03/21/16  Yes Janetta Hora, PA-C  isosorbide mononitrate (IMDUR) 30 MG 24 hr tablet Take 30 mg by mouth daily. 07/20/16  Yes [provider]  latanoprost (XALATAN) 0.005 % ophthalmic solution Place 1 drop into both eyes at bedtime. 06/02/14  Yes Oneal Grout, MD  levalbuterol [facility] HFA) 45 MCG/ACT inhaler Inhale 2 puffs into the lungs every 6 (six) hours as needed for wheezing or shortness of breath. 02/03/16  Yes Montez Morita, Monica, DO  levothyroxine (SYNTHROID, LEVOTHROID) 137 MCG tablet TAKE 1 TABLET (137 MCG TOTAL) BY MOUTH DAILY BEFORE BREAKFAST. 07/31/16  Yes Kimber Relic, MD  Multiple Vitamin (MULTIVITAMIN WITH MINERALS) TABS tablet Take 1 tablet by mouth daily.   Yes [provider]  Polyethyl Glycol-Propyl Glycol 0.4-0.3 % SOLN Place 1 drop into both eyes 4 (four) times daily.    Yes [provider]  RESTASIS 0.05 % ophthalmic emulsion USE 1 DROP INTO BOTH EYES TWICE DAILY 06/28/16  Yes Kimber Relic, MD  sertraline (ZOLOFT) 100 MG tablet TAKE ONE TABLET BY MOUTH ONCE DAILY FOR DEPRESSION 04/10/16  Yes Kirt Boys, DO  warfarin (COUMADIN) 2.5 MG tablet TAKE AS DIRECTED BY COUMADIN CLINIC. Patient taking differently: Take 1.5 tablets (3.75 mg) by mouth Tues thru Sun then Take 1 tablet (2.5 mg) by mouth on Mon 08/15/16  Yes Lars Masson, MD  Water For Irrigation, Sterile (FREE WATER) SOLN Place 230 mLs into feeding tube 3 (three) times daily.   Yes [provider]  acetaminophen (TYLENOL) 500 MG tablet Take 1,000 mg by mouth every 6 (six) hours as needed (pain).    [provider]  Maltodextrin-Xanthan Gum (RESOURCE THICKENUP CLEAR) POWD Take 120 g by mouth as needed (use to thicken liquids.). 11/11/15   Rodolph Bong, MD  polyethylene glycol [facility] / Ethelene Hal) packet Take 17 g by mouth daily as needed for mild constipation.    [provider]    Physical Exam: Vitals:   09/14/16 2130 09/14/16 2131 09/14/16 2135 09/14/16 2348  BP: (!) 157/127   (!) 166/70  Pulse: 83   83  Resp: (!) 25   (!) 24  Temp:  100 F (37.8 C)    TempSrc:  Rectal    SpO2: 100%  94% 94%    Constitutional: NAD, calm, comfortable Eyes: PERRL, lids and conjunctivae normal ENMT: Mucous membranes are moist. Posterior pharynx clear of any exudate or lesions.Normal dentition.  Neck: normal, supple, no masses, no thyromegaly Respiratory: Diffuse wheezes Cardiovascular: Regular rate and rhythm, no murmurs / rubs / gallops. No extremity edema. 2+ pedal pulses. No carotid bruits.  Abdomen: no tenderness, no masses palpated. No hepatosplenomegaly. Bowel sounds positive.  Musculoskeletal: no clubbing / cyanosis. No joint deformity upper and lower extremities. Good ROM, no contractures. Normal muscle tone.  Skin: Gangrene of 2nd toe of L foot with purulent foul smelling discharge. Neurologic: CN 2-12 grossly intact. Sensation intact, DTR normal. Strength 5/5 in all 4.  Psychiatric: Normal judgment and insight. Alert and oriented x 3. Normal mood.    Labs on Admission: I have personally reviewed following labs and imaging studies  CBC:  Recent Labs Lab 09/14/16 2140  WBC 10.6*  NEUTROABS 9.3*  HGB 10.6*  HCT 31.0*  MCV 85.9  PLT 342   Basic Metabolic Panel:  Recent Labs Lab 09/14/16 2140  NA 129*  K 4.3  CL 94*  CO2 24  GLUCOSE 154*  BUN 38*  CREATININE 1.02*  CALCIUM 9.2   GFR: Estimated Creatinine Clearance: 33.6 mL/min (A) (by C-G formula based on SCr of 1.02 mg/dL (H)). Liver Function Tests: No results for input(s): AST, ALT, ALKPHOS,  BILITOT, PROT, ALBUMIN in the last 168 hours. No results for input(s): LIPASE, AMYLASE in the last 168 hours. No results for input(s): AMMONIA in the last 168 hours. Coagulation Profile:  Recent Labs Lab 09/13/16 1208  INR 2.9   Cardiac Enzymes:  Recent Labs Lab 09/14/16 2140  TROPONINI 0.03*   BNP (last 3 results)  Recent Labs  05/08/16 1053  PROBNP 1,132*   HbA1C: No results for input(s): HGBA1C in the last 72 hours. CBG: No results for input(s): GLUCAP in the last 168 hours. Lipid Profile: No results for input(s): CHOL, HDL, LDLCALC, TRIG, CHOLHDL, LDLDIRECT in the last 72 hours. Thyroid Function Tests: No results for input(s): TSH, T4TOTAL, FREET4, T3FREE, THYROIDAB in the last 72 hours. Anemia Panel: No results for input(s): VITAMINB12, FOLATE, FERRITIN, TIBC, IRON, RETICCTPCT in the last 72 hours. Urine analysis:    Component Value Date/Time   COLORURINE YELLOW 06/08/2016 1647   APPEARANCEUR CLEAR 06/08/2016 1647   LABSPEC 1.006 06/08/2016 1647   PHURINE 8.0 06/08/2016 1647   GLUCOSEU NEGATIVE 06/08/2016 1647   HGBUR NEGATIVE 06/08/2016 1647   BILIRUBINUR NEGATIVE 06/08/2016 1647   KETONESUR NEGATIVE 06/08/2016 1647   PROTEINUR NEGATIVE 06/08/2016 1647   UROBILINOGEN 0.2 01/27/2015 2308   NITRITE NEGATIVE 06/08/2016 1647   LEUKOCYTESUR NEGATIVE 06/08/2016 1647    Radiological Exams on Admission: Dg Chest 2 View  Result Date: 09/14/2016 CLINICAL DATA:  Shortness of breath EXAM: CHEST  2 VIEW COMPARISON:  Aug 16, 2016 FINDINGS: The heart size and mediastinal contours are within normal limits. There is minimal left pleural effusion. There is no focal pneumonia or pulmonary edema. The visualized skeletal structures are unremarkable. IMPRESSION: No focal pneumonia.  Minimal left pleural effusion. Electronically Signed   By: Sherian Rein  M.D.   On: 09/14/2016 21:04    EKG: Independently reviewed.  Assessment/Plan Principal Problem:   COPD exacerbation  (HCC) Active Problems:   Diabetes mellitus type 2 in nonobese (HCC)   Chronic kidney insufficiency, stage 3 (moderate)   Accelerated hypertension   Hyponatremia   Chronic diastolic CHF (congestive heart failure) (HCC)   Atherosclerosis of native arteries of the extremities with gangrene (HCC)   Gangrene of toe of left foot (HCC)    1. COPD exacerbation - 1. COPD pathway 2. Prednisone daily 3. Adult wheeze protocol 4. Continue home nebs 2. Gangrene of toe of L foot - with Tm 100.0, WBC 10.6k, purulent discharge 1. Will change ABx from cefepime / vanc to rocephin / flagyl / vanc 2. Does have MRSA risk factors (ongoing wound care, h/o MRSA+ in nares x5 times, most recently Feb this year). 3. DM foot ulcer pathway 4. Call Podiatrist in AM for evaluation / possible debridement 3. HTN - continue home BP meds, but will hold lasix 4. Hyponatremia - 1. will "hydrate" patient gently by holding lasix 2. Repeat BMP in AM 5. Chronic diastolic CHF - not in acute exacerbation, holding lasix as above 6. DM2 - Sensitive scale SSI AC 7. CKD stage 3 - chronic, and appears baseline today.  DVT prophylaxis: Coumadin, patient has h/o DVT in past and takes chronic coumadin Code Status: DNR Family Communication: No family in room Disposition Plan: Home after admit Consults called: None Admission status: Admit to inpatient - numerous acute problems that will likely take several days of inpatient treatment, possible surgical debridement, etc.   Leovanni Bjorkman M. DO [facility] Pager [phone]  If 7AM-7PM, please contact day team taking care of patient [email or web address] Password [facility]  09/15/2016, 12:42 AM

## 2016-09-15 NOTE — Progress Notes (Signed)
ANTICOAGULATION CONSULT NOTE - Follow Up Consult  Pharmacy Consult for heparin Indication: hx DVT  Allergies  Allergen Reactions  . Penicillins Itching, Rash and Other (See Comments)    Tolerated unasyn, zosyn & cephalosporins in the past.  Has patient had a PCN reaction causing immediate rash, facial/tongue/throat swelling, SOB or lightheadedness with hypotension: Yes Has patient had a PCN reaction causing severe rash involving mucus membranes or skin necrosis: Unk Has patient had a PCN reaction that required hospitalization: Unk Has patient had a PCN reaction occurring within the last 10 years: Unk If all of the above answers are "NO", then may proceed with Cephalosporins  . Other     Patient tolerates amoxicillin and cephalosporins    Patient Measurements: Height: 5\' 6"  (167.6 cm) Weight: 140 lb 3.4 oz (63.6 kg) IBW/kg (Calculated) : 59.3  Vital Signs: Temp: 97.8 F (36.6 C) (06/15 1429) Temp Source: Oral (06/15 1429) BP: 149/76 (06/15 1429) Pulse Rate: 75 (06/15 1429)  Labs:  Recent Labs  09/13/16 1208 09/14/16 2140 09/15/16 0745 09/15/16 0752 09/15/16 1445  HGB  --  10.6*  --   --   --   HCT  --  31.0*  --   --   --   PLT  --  342  --   --   --   LABPROT  --   --   --  23.3*  --   INR 2.9  --   --  2.04  --   CREATININE  --  1.02*  --   --   --   TROPONINI  --  0.03* <0.03  --  <0.03    Estimated Creatinine Clearance: 33.6 mL/min (A) (by C-G formula based on SCr of 1.02 mg/dL (H)).   Medications:  Infusions:  . sodium chloride 1 mL/hr (09/14/16 2218)  . cefTRIAXone (ROCEPHIN)  IV 2 g (09/15/16 1034)  . heparin    . metronidazole 500 mg (09/15/16 1728)  . vancomycin      Assessment: Patient is a 83 yo female oh chronic warfarin for h/o DVT was admitted 6/15 with shortness of breath and gangrene on left toe. Warfarin on hold for possible procedure. Pharmacy consulted to begin heparin when INR <2.  INR was 2.04 this morning so expect INR to be <2  especially with missed dose last night.  PTA warfarin dose: 3.75mg /day except 2.5mg  Mon (25mg /wk)  Goal of Therapy:  Heparin level 0.3-0.7 units/ml Monitor platelets by anticoagulation protocol: Yes   Plan:  - Begin heparin drip at 900 units/hr with no bolus - 8 he heparin level - Daily heparin level and CBC - INR in am - Monitor for s/sx of bleeding   Renold Genta, PharmD, [place] - [phone] 09/15/2016 7:24 PM

## 2016-09-15 NOTE — Progress Notes (Signed)
ABI order received. Arterial evaluation was completed at [facility] 07/28/16, results in [facility]. Please advise if ABI is still needed.  09/15/2016 9:41 AM Maudry Mayhew, BS, RVT, RDCS, RDMS

## 2016-09-15 NOTE — Telephone Encounter (Signed)
LMOM to return call.

## 2016-09-15 NOTE — Progress Notes (Signed)
Transferred to Shannon [name] for  Possible surgery on the toe on the left foot .Transported via [place]

## 2016-09-15 NOTE — Progress Notes (Signed)
OT Cancellation Note  Patient Details Name: Shannon [name] MRN: [id number] DOB: 1923/10/12   Cancelled Treatment:    Reason Eval/Treat Not Completed: Medical issues which prohibited therapy.  Noted plan is for transfer to [facility] for amputation. Will plan to see pt after this sx. Please reorder. Thanks.   Nolberto Cheuvront 09/15/2016, 2:19 PM  Lesle Chris, OTR/L [phone] 09/15/2016

## 2016-09-15 NOTE — Progress Notes (Signed)
Upon arrival to unit at 0200 Pt. Had worsening left chest pain at a level of 10 and SOB. Administered 12-lead, morphine, hydralazine and solu-medrol. Called the MD and rapid response. Rapid remained until patient was stable. Pain decreased and pt. Is now resting comfortably.

## 2016-09-15 NOTE — Telephone Encounter (Signed)
[name] notified that decision regarding supplements would be made once pt discharged from hospital. She verbalized understanding.

## 2016-09-16 DIAGNOSIS — I96 Gangrene, not elsewhere classified: Secondary | ICD-10-CM

## 2016-09-16 DIAGNOSIS — R0602 Shortness of breath: Secondary | ICD-10-CM

## 2016-09-16 LAB — GLUCOSE, CAPILLARY
Glucose-Capillary: 224 mg/dL — ABNORMAL HIGH (ref 65–99)
Glucose-Capillary: 239 mg/dL — ABNORMAL HIGH (ref 65–99)
Glucose-Capillary: 245 mg/dL — ABNORMAL HIGH (ref 65–99)
Glucose-Capillary: 153 mg/dL — ABNORMAL HIGH (ref 65–99)

## 2016-09-16 LAB — HEPARIN LEVEL (UNFRACTIONATED)
Heparin Unfractionated: 0.13 IU/mL — ABNORMAL LOW (ref 0.30–0.70)
Heparin Unfractionated: 0.22 IU/mL — ABNORMAL LOW (ref 0.30–0.70)

## 2016-09-16 LAB — CBC
HCT: 29.7 % — ABNORMAL LOW (ref 36.0–46.0)
Hemoglobin: 9.8 g/dL — ABNORMAL LOW (ref 12.0–15.0)
MCH: 28.3 pg (ref 26.0–34.0)
MCHC: 33 g/dL (ref 30.0–36.0)
MCV: 85.8 fL (ref 78.0–100.0)
Platelets: 362 10*3/uL (ref 150–400)
RBC: 3.46 MIL/uL — ABNORMAL LOW (ref 3.87–5.11)
RDW: 16.9 % — ABNORMAL HIGH (ref 11.5–15.5)
WBC: 13 10*3/uL — ABNORMAL HIGH (ref 4.0–10.5)

## 2016-09-16 LAB — PROTIME-INR
INR: 2.07
Prothrombin Time: 23.6 seconds — ABNORMAL HIGH (ref 11.4–15.2)

## 2016-09-16 MED ORDER — ALBUTEROL SULFATE (2.5 MG/3ML) 0.083% IN NEBU
2.5000 mg | INHALATION_SOLUTION | RESPIRATORY_TRACT | Status: DC | PRN
  Administered 2016-09-17: 2.5 mg via RESPIRATORY_TRACT
  Filled 2016-09-16: qty 3

## 2016-09-16 MED ORDER — OSMOLITE 1.5 CAL PO LIQD
1000.0000 mL | ORAL | Status: DC
  Administered 2016-09-16 – 2016-09-26 (×10): 1000 mL
  Filled 2016-09-16 (×22): qty 1000

## 2016-09-16 NOTE — Progress Notes (Signed)
ANTICOAGULATION CONSULT NOTE - Follow Up Consult  Pharmacy Consult for heparin Indication: h/o DVT  Labs:  Recent Labs  09/14/16 2140 09/15/16 0745 09/15/16 0752 09/15/16 1445 09/16/16 0458 09/16/16 1455  HGB 10.6*  --   --   --  9.8*  --   HCT 31.0*  --   --   --  29.7*  --   PLT 342  --   --   --  362  --   LABPROT  --   --  23.3*  --  23.6*  --   INR  --   --  2.04  --  2.07  --   HEPARINUNFRC  --   --   --   --  0.13* 0.22*  CREATININE 1.02*  --   --   --   --   --   TROPONINI 0.03* <0.03  --  <0.03  --   --     Assessment: 83yo female subtherapeutic on heparin with initial dosing while Coumadin on hold.  INR 2.07 this morning. Heparin level a little low at 0.22units/mL.  hgb 9.8, plts 362- no bleeding noted.  Goal of Therapy:  Heparin level 0.3-0.7 units/ml   Plan:  Increase heparin to 1300 units/hr  Daily INR, heparin level and CBC Follow plans for arteriogram  Shannon [name], PharmD, BCPS Clinical Pharmacist 09/16/2016 4:27 PM

## 2016-09-16 NOTE — Progress Notes (Signed)
Nutrition Follow-up  DOCUMENTATION CODES:   Non-severe (moderate) malnutrition in context of chronic illness  INTERVENTION:  Discontinue Osmolite 1.2 formula.   Initiate nocturnal tube feeds via PEG using Osmolite 1.5 formula at rate of 65 ml/hr x 12 hours (9pm-9am).  Continue 30 ml Prostat BID (or equivalent), each supplement provides 100 kcal and 15 grams of protein.   Continue free water flushes of 230 ml BID via tube.   Tube feeding regimen provides 1370 kcal, 79 grams of protein, and 1053 ml of free water.   Continue Nepro Shake po BID, each supplement provides 425 kcal and 19 grams protein.  NUTRITION DIAGNOSIS:   Malnutrition related to chronic illness (CKD, COPD, CHF, DM, advanced age) as evidenced by moderate depletions of muscle mass, moderate depletion of body fat; ongoing  GOAL:   Patient will meet greater than or equal to 90% of their needs; progressing  MONITOR:   PO intake, Supplement acceptance, Labs, Weight trends, I & O's, Skin  REASON FOR ASSESSMENT:   Consult Wound healing  ASSESSMENT:   83 y.o. female with medical history significant of DM2, COPD, HTN, CKD stage 3, CHF, diabetic foot ulcers.   RD consulted to modify tube feeding orders. Noted pt had Osmolite 1.2 formula ordered at 237 ml once daily at bedtime. Pt's tube feeding regimen is Osmolite 1.5 formula at 65 ml/hr x 12 hours (9pm-9am). RD to modify Osmolite order. Pt currently has Prostat and Nepro ordered. RD to continue with those current orders.   Labs and medications reviewed.   Diet Order:  Diet full liquid Room service appropriate? Yes; Fluid consistency: Nectar Thick Diet NPO time specified Except for: Sips with Meds  Skin:  Wound (see comment) ( gangrene of the left second toe )  Last BM:  6/15  Height:   Ht Readings from Last 1 Encounters:  09/15/16 5\' 6"  (1.676 m)    Weight:   Wt Readings from Last 1 Encounters:  09/15/16 140 lb 3.4 oz (63.6 kg)    Ideal Body Weight:   59 kg  BMI:  Body mass index is 22.63 kg/m.  Estimated Nutritional Needs:   Kcal:  1500-1700  Protein:  75-90 grams  Fluid:  >/= 1.5 L/day  EDUCATION NEEDS:   Education needs addressed  Corrin Parker, MS, RD, LDN Pager # [phone] After hours/ weekend pager # [phone]

## 2016-09-16 NOTE — Progress Notes (Signed)
ANTICOAGULATION CONSULT NOTE - Follow Up Consult  Pharmacy Consult for heparin Indication: h/o DVT  Labs:  Recent Labs  09/13/16 1208 09/14/16 2140 09/15/16 0745 09/15/16 0752 09/15/16 1445 09/16/16 0458  HGB  --  10.6*  --   --   --  9.8*  HCT  --  31.0*  --   --   --  29.7*  PLT  --  342  --   --   --  362  LABPROT  --   --   --  23.3*  --  23.6*  INR 2.9  --   --  2.04  --  2.07  HEPARINUNFRC  --   --   --   --   --  0.13*  CREATININE  --  1.02*  --   --   --   --   TROPONINI  --  0.03* <0.03  --  <0.03  --     Assessment: 83yo female subtherapeutic on heparin with initial dosing while Coumadin on hold.  Goal of Therapy:  Heparin level 0.3-0.7 units/ml   Plan:  Will increase heparin gtt by 3 units/kg/hr to 1100 units/hr and check level in [place], PharmD, BCPS  09/16/2016,6:38 AM

## 2016-09-16 NOTE — Progress Notes (Signed)
PROGRESS NOTE    Shannon [name]  QIO:[id number] DOB: 1923/04/20 DOA: 09/14/2016 PCP: Shannon Boys, DO     Brief Narrative:  Shannon [name] is a 83yo female with medical history significant for type 2 diabetes, COPD, hypertension, CKD stage III, CHF, diabetic foot ulcers. She presented to the emergency department complaining of cough, shortness of breath that started several days ago. She was admitted to the hospital for further treatment for COPD exacerbation. On admission, gangrene of the left second toe was noted. Vascular surgery was consulted, who recommended patient transferred to [facility] for further evaluation, treatment, possibly amputation. Previously, patient had followed up in the office in April 2018 and at that time, she had refused surgery, however, currently more agreeable to surgical intervention.  Assessment & Plan:   Principal Problem:   COPD exacerbation (HCC) Active Problems:   Diabetes mellitus type 2 in nonobese (HCC)   Chronic kidney insufficiency, stage 3 (moderate)   Accelerated hypertension   Hyponatremia   Chronic diastolic CHF (congestive heart failure) (HCC)   Atherosclerosis of native arteries of the extremities with gangrene (HCC)   Gangrene of toe of left foot (HCC)   Acute hypoxemic respiratory failure -Continue nasal cannula oxygen, wean as able  COPD exacerbation  -Continue prednisone, DuoNeb's  Gangrene of second toe left foot -Vascular surgery following -Plan for arteriogram next week when INR is lower -Continue vancomycin, Flagyl  Essential hypertension -Continue Coreg  Chronic diastolic heart failure -Continue coreg, imdur  -Stable  Diabetes mellitus with neuropathy -Sliding-scale insulin -Continue neurontin   Chronic kidney disease stage III -Stable  Hx DVT -Holding coumadin, continue IV heparin pending further procedure plan   Chest pressure -Has had left-sided chest pressure on and off over the past week, currently symptom  free -Echocardiogram completed 05/09/2016 , with EF 60%, no regional wall motion abnormalities -Check troponin, EKG if symptom recurs   HLD  -Continue lipitor  PEG in place -Dietitian consult   Hypothyroidism -Continue synthroid    DVT prophylaxis: heparin IV, hold coumadin  Code Status: DNR Family Communication: daughter updated over the phone  Disposition Plan: pending further work up   Consultants:   Vascular surgery  Procedures:   None  Antimicrobials:  Anti-infectives    Start     Dose/Rate Route Frequency Ordered Stop   09/15/16 2200  vancomycin (VANCOCIN) IVPB 1000 mg/200 mL premix     1,000 mg 200 mL/hr over 60 Minutes Intravenous Every 24 hours 09/15/16 0000     09/15/16 1000  cefTRIAXone (ROCEPHIN) 2 g in dextrose 5 % 50 mL IVPB     2 g 100 mL/hr over 30 Minutes Intravenous Every 24 hours 09/15/16 0122     09/15/16 0000  metroNIDAZOLE (FLAGYL) IVPB 500 mg     500 mg 100 mL/hr over 60 Minutes Intravenous Every 8 hours 09/14/16 2350     09/14/16 2230  ceFEPIme (MAXIPIME) 2 g in dextrose 5 % 50 mL IVPB     2 g 100 mL/hr over 30 Minutes Intravenous NOW 09/14/16 2215 09/15/16 0128   09/14/16 2145  levofloxacin (LEVAQUIN) IVPB 750 mg  Status:  Discontinued     750 mg 100 mL/hr over 90 Minutes Intravenous  Once 09/14/16 2138 09/14/16 2215   09/14/16 2145  vancomycin (VANCOCIN) IVPB 1000 mg/200 mL premix     1,000 mg 200 mL/hr over 60 Minutes Intravenous  Once 09/14/16 2138 09/15/16 0046         Subjective: Patient in good spirits today. She  states that she continues to have productive cough, short of breath. She also mentions that she has had left chest heaviness on and off over the past week. She states that these episodes come and go spontaneously. Currently, she is not having any chest pressure. She denies any nausea, vomiting, abdominal pain.  Objective: Vitals:   09/15/16 2005 09/15/16 2148 09/16/16 0618 09/16/16 0957  BP:  (!) 154/72 (!) 177/83 (!)  190/76  Pulse:  73 78 88  Resp:  16 16 18   Temp:  98.1 F (36.7 C) 97.4 F (36.3 C) 97.9 F (36.6 C)  TempSrc:  Oral Oral Oral  SpO2: 100% 95% 100% 95%  Weight:      Height:        Intake/Output Summary (Last 24 hours) at 09/16/16 1248 Last data filed at 09/16/16 0900  Gross per 24 hour  Intake          1300.45 ml  Output              250 ml  Net          1050.45 ml   Filed Weights   09/15/16 0212  Weight: 63.6 kg (140 lb 3.4 oz)    Examination:  General exam: Appears calm and comfortable  Respiratory system: +On nasal cannula O2, wheezes bilaterally Cardiovascular system: S1 & S2 heard, RRR. No JVD, murmurs, rubs, gallops or clicks. No pedal edema. Gastrointestinal system: Abdomen is nondistended, soft and nontender. No organomegaly or masses felt. Normal bowel sounds heard. Central nervous system: Alert and oriented. No focal neurological deficits. Extremities: Symmetric Skin: + Left second toe with gangrene Psychiatry: Judgement and insight appear normal. Mood & affect appropriate.   Data Reviewed: I have personally reviewed following labs and imaging studies  CBC:  Recent Labs Lab 09/14/16 2140 09/16/16 0458  WBC 10.6* 13.0*  NEUTROABS 9.3*  --   HGB 10.6* 9.8*  HCT 31.0* 29.7*  MCV 85.9 85.8  PLT 342 362   Basic Metabolic Panel:  Recent Labs Lab 09/14/16 2140  NA 129*  K 4.3  CL 94*  CO2 24  GLUCOSE 154*  BUN 38*  CREATININE 1.02*  CALCIUM 9.2   GFR: Estimated Creatinine Clearance: 33.6 mL/min (A) (by C-G formula based on SCr of 1.02 mg/dL (H)). Liver Function Tests: No results for input(s): AST, ALT, ALKPHOS, BILITOT, PROT, ALBUMIN in the last 168 hours. No results for input(s): LIPASE, AMYLASE in the last 168 hours. No results for input(s): AMMONIA in the last 168 hours. Coagulation Profile:  Recent Labs Lab 09/13/16 1208 09/15/16 0752 09/16/16 0458  INR 2.9 2.04 2.07   Cardiac Enzymes:  Recent Labs Lab 09/14/16 2140  09/15/16 0745 09/15/16 1445  TROPONINI 0.03* <0.03 <0.03   BNP (last 3 results)  Recent Labs  05/08/16 1053  PROBNP 1,132*   HbA1C: No results for input(s): HGBA1C in the last 72 hours. CBG:  Recent Labs Lab 09/15/16 1202 09/15/16 1732 09/15/16 2141 09/16/16 0741 09/16/16 1232  GLUCAP 321* 248* 224* 224* 239*   Lipid Profile: No results for input(s): CHOL, HDL, LDLCALC, TRIG, CHOLHDL, LDLDIRECT in the last 72 hours. Thyroid Function Tests: No results for input(s): TSH, T4TOTAL, FREET4, T3FREE, THYROIDAB in the last 72 hours. Anemia Panel: No results for input(s): VITAMINB12, FOLATE, FERRITIN, TIBC, IRON, RETICCTPCT in the last 72 hours. Sepsis Labs:  Recent Labs Lab 09/14/16 2147  LATICACIDVEN 0.87    Recent Results (from the past 240 hour(s))  Culture, blood (Routine X 2) w  Reflex to ID Panel     Status: None (Preliminary result)   Collection Time: 09/14/16  9:48 PM  Result Value Ref Range Status   Specimen Description BLOOD RIGHT ANTECUBITAL  Final   Special Requests   Final    BOTTLES DRAWN AEROBIC AND ANAEROBIC Blood Culture adequate volume   Culture   Final    NO GROWTH 1 DAY Performed at [facility], [address], [place], [place] [address]    Report Status PENDING  Incomplete  Culture, blood (Routine X 2) w Reflex to ID Panel     Status: None (Preliminary result)   Collection Time: 09/14/16 11:25 PM  Result Value Ref Range Status   Specimen Description BLOOD BLOOD LEFT HAND  Final   Special Requests   Final    BOTTLES DRAWN AEROBIC AND ANAEROBIC Blood Culture adequate volume   Culture   Final    NO GROWTH 1 DAY Performed at [facility], [address] [place], [place] [address]    Report Status PENDING  Incomplete  MRSA PCR Screening     Status: None   Collection Time: 09/15/16  5:16 PM  Result Value Ref Range Status   MRSA by PCR NEGATIVE NEGATIVE Final    Comment:        The GeneXpert MRSA Assay (FDA approved for NASAL  specimens only), is one component of a comprehensive MRSA colonization surveillance program. It is not intended to diagnose MRSA infection nor to guide or monitor treatment for MRSA infections.        Radiology Studies: Dg Chest 2 View  Result Date: 09/14/2016 CLINICAL DATA:  Shortness of breath EXAM: CHEST  2 VIEW COMPARISON:  Aug 16, 2016 FINDINGS: The heart size and mediastinal contours are within normal limits. There is minimal left pleural effusion. There is no focal pneumonia or pulmonary edema. The visualized skeletal structures are unremarkable. IMPRESSION: No focal pneumonia.  Minimal left pleural effusion. Electronically Signed   By: Sherian Rein M.D.   On: 09/14/2016 21:04      Scheduled Meds: . atorvastatin  10 mg Oral q1800  . calcium-vitamin D  1 tablet Oral Q breakfast  . carvedilol  6.25 mg Oral BID  . chlorhexidine  15 mL Mouth/Throat BID  . cycloSPORINE  1 drop Both Eyes BID  . feeding supplement (NEPRO CARB STEADY)  237 mL Oral BID BM  . feeding supplement (OSMOLITE 1.2 CAL)  237 mL Per Tube QHS  . feeding supplement (PRO-STAT SUGAR FREE 64)  30 mL Oral BID  . fluticasone  1 spray Each Nare BID  . free water  230 mL Per Tube BID  . gabapentin  600 mg Oral TID  . insulin aspart  0-20 Units Subcutaneous TID WC  . ipratropium-albuterol  3 mL Nebulization QID  . isosorbide mononitrate  30 mg Oral Daily  . latanoprost  1 drop Both Eyes QHS  . levothyroxine  137 mcg Oral QAC breakfast  . multivitamin with minerals  1 tablet Oral Daily  . nystatin   Topical TID  . polyvinyl alcohol  1 drop Both Eyes QID  . predniSONE  40 mg Oral Q breakfast  . sertraline  100 mg Oral Daily   Continuous Infusions: . sodium chloride 10 mL/hr (09/15/16 2048)  . cefTRIAXone (ROCEPHIN)  IV Stopped (09/16/16 1149)  . heparin 1,100 Units/hr (09/16/16 6440)  . metronidazole Stopped (09/16/16 0940)  . vancomycin Stopped (09/16/16 0031)     LOS: 1  day    Time spent: 40  minutes   Noralee Stain, DO [facility] [email or web address] Password [facility] 09/16/2016, 12:48 PM

## 2016-09-16 NOTE — Progress Notes (Signed)
PT Cancellation Note  Patient Details Name: Shannon [name] MRN: [id number] DOB: [birth year]   Cancelled Treatment:    Reason Eval/Treat Not Completed: Medical issues which prohibited therapy. Pt reports that she doesn't feel well and is really dyspneic when PT arrives. RN notified. RN requests PT hold evaluation until after pt has her surgery on Monday 09/18/16. Will check back in between as time allows.    [name] PT, DPT  [phone]  09/16/2016, 12:08 PM

## 2016-09-16 NOTE — Consult Note (Signed)
[facility] [place]  Patient name: Shannon [name] MRN: [id number] DOB: 10-07-1923 Sex: female    HPI: Shannon [name] is a 83 y.o. female known to our practice with gangrenous changes of the left foot. She was seen by Dr. Edilia Bo in April of this year. At that time she had dry gangrenous changes to her left second and third toes. Long discussion regarding observation versus arteriography versus primary above-knee amputation was entertained at that time with the patient and her family present. She was admitted to [facility] for other medical issues and we were reconsulted regarding her foot. The patient is alert and oriented this morning. She denies any specific pain associated with her foot.  Past Medical History:  Diagnosis Date  . Anemia    previous blood transfusions  . Arthritis    "all over"  . Asthma   . Bradycardia    requiring previous d/c of BB and reduction of amiodarone  . CAD (coronary artery disease)    nonobstructive per notes  . Chronic diastolic CHF (congestive heart failure) (HCC)   . CKD (chronic kidney disease), stage III   . Complication of blood transfusion    "got the wrong blood type at [facility] in ~ 2015; no adverse reaction that we are aware of"/daughter, [name] (01/27/2016)  . COPD (chronic obstructive pulmonary disease) (HCC)   . Depression    "light case"  . DVT (deep venous thrombosis) (HCC) 01/2016   a. LLE DVT 01/2016 - switched from Eliquis to Coumadin.  . Gastric stenosis    a. s/p stomach tube  . GERD (gastroesophageal reflux disease)   . History of blood transfusion    "several" (01/27/2016)  . History of stomach ulcers   . Hyperlipidemia   . Hypertension   . Hypothyroidism   . Paraesophageal hernia   . Perforated gastric ulcer (HCC)   . Seasonal allergies   . SIADH (syndrome of inappropriate ADH production) (HCC)    [name] 01/10/2015  . Small bowel obstruction (HCC)    "I  don't know how many" (01/11/2015)  . Stroke (HCC)    "light one"  . Type II diabetes mellitus (HCC)    "related to prednisone use  for > 20 yr; once predinose stopped; no more DM RX" (01/27/2016)  . Ventral hernia with bowel obstruction     Family History  Problem Relation Age of Onset  . Stroke Mother   . Hypertension Mother   . Diabetes Brother   . Heart attack Neg Hx     SOCIAL HISTORY: Social History  Substance Use Topics  . Smoking status: Never Smoker  . Smokeless tobacco: Former Neurosurgeon    Types: Snuff     Comment: "used snuff in my younger days"  . Alcohol use No    Allergies  Allergen Reactions  . Penicillins Itching, Rash and Other (See Comments)    Tolerated unasyn, zosyn & cephalosporins in the past.  Has patient had a PCN reaction causing immediate rash, facial/tongue/throat swelling, SOB or lightheadedness with hypotension: Yes Has patient had a PCN reaction causing severe rash involving mucus membranes or skin necrosis: Unk Has patient had a PCN reaction that required hospitalization: Unk Has patient had a PCN reaction occurring within the last 10 years: Unk If all of the above answers are "NO", then may proceed with Cephalosporins  . Other     Patient tolerates amoxicillin and cephalosporins    Current Facility-Administered Medications  Medication Dose Route  Frequency Provider Last Rate Last Dose  . 0.9 %  sodium chloride infusion   Intravenous Continuous Lorre Nick, MD 10 mL/hr at 09/15/16 2048 10 mL/hr at 09/15/16 2048  . acetaminophen (TYLENOL) tablet 1,000 mg  1,000 mg Oral Q6H PRN Hillary Bow, DO      . albuterol (PROVENTIL) (2.5 MG/3ML) 0.083% nebulizer solution 2.5 mg  2.5 mg Nebulization Q6H PRN Hillary Bow, DO   2.5 mg at 09/15/16 0303  . atorvastatin (LIPITOR) tablet 10 mg  10 mg Oral q1800 Lyda Perone M, DO   10 mg at 09/15/16 1730  . benzonatate (TESSALON) capsule 200 mg  200 mg Oral TID PRN Hillary Bow, DO      .  calcium-vitamin D (OSCAL WITH D) 500-200 MG-UNIT per tablet 1 tablet  1 tablet Oral Q breakfast Hillary Bow, DO   1 tablet at 09/15/16 1033  . carvedilol (COREG) tablet 6.25 mg  6.25 mg Oral BID Hillary Bow, DO   6.25 mg at 09/15/16 2103  . cefTRIAXone (ROCEPHIN) 2 g in dextrose 5 % 50 mL IVPB  2 g Intravenous Q24H Lorenza Evangelist, RPH 100 mL/hr at 09/15/16 1034 2 g at 09/15/16 1034  . chlorhexidine (PERIDEX) 0.12 % solution 15 mL  15 mL Mouth/Throat BID Hillary Bow, DO   15 mL at 09/15/16 2103  . cycloSPORINE (RESTASIS) 0.05 % ophthalmic emulsion 1 drop  1 drop Both Eyes BID Hillary Bow, DO   1 drop at 09/15/16 2331  . feeding supplement (NEPRO CARB STEADY) liquid 237 mL  237 mL Oral BID BM Meredeth Ide, MD   237 mL at 09/15/16 1728  . feeding supplement (OSMOLITE 1.2 CAL) liquid 237 mL  237 mL Per Tube QHS Hillary Bow, DO   237 mL at 09/15/16 2330  . feeding supplement (PRO-STAT SUGAR FREE 64) liquid 30 mL  30 mL Oral BID Meredeth Ide, MD   30 mL at 09/15/16 2109  . fluticasone (FLONASE) 50 MCG/ACT nasal spray 1 spray  1 spray Each Nare BID Hillary Bow, DO   1 spray at 09/15/16 2111  . free water 230 mL  230 mL Per Tube BID Meredeth Ide, MD   230 mL at 09/15/16 2200  . gabapentin (NEURONTIN) capsule 600 mg  600 mg Oral TID Hillary Bow, DO   600 mg at 09/15/16 2103  . heparin ADULT infusion 100 units/mL (25000 units/222mL sodium chloride 0.45%)  1,100 Units/hr Intravenous Continuous Juliette Mangle, RPH 11 mL/hr at 09/16/16 4010 1,100 Units/hr at 09/16/16 2725  . hydrALAZINE (APRESOLINE) tablet 25 mg  25 mg Oral Q6H PRN Meredeth Ide, MD      . insulin aspart (novoLOG) injection 0-20 Units  0-20 Units Subcutaneous TID WC Meredeth Ide, MD   7 Units at 09/15/16 1740  . ipratropium-albuterol (DUONEB) 0.5-2.5 (3) MG/3ML nebulizer solution 3 mL  3 mL Nebulization Q6H PRN Hillary Bow, DO      . ipratropium-albuterol (DUONEB) 0.5-2.5 (3) MG/3ML nebulizer  solution 3 mL  3 mL Nebulization QID Hillary Bow, DO   3 mL at 09/15/16 2005  . isosorbide mononitrate (IMDUR) 24 hr tablet 30 mg  30 mg Oral Daily Lyda Perone M, DO   30 mg at 09/15/16 1035  . latanoprost (XALATAN) 0.005 % ophthalmic solution 1 drop  1 drop Both Eyes QHS Lyda Perone M, DO   1 drop at 09/15/16 2331  .  levothyroxine (SYNTHROID, LEVOTHROID) tablet 137 mcg  137 mcg Oral QAC breakfast Hillary Bow, DO   137 mcg at 09/15/16 0800  . metroNIDAZOLE (FLAGYL) IVPB 500 mg  500 mg Intravenous Q8H Hillary Bow, DO   Stopped at 09/16/16 0340  . multivitamin with minerals tablet 1 tablet  1 tablet Oral Daily Lyda Perone M, DO   1 tablet at 09/15/16 1034  . nystatin (MYCOSTATIN/NYSTOP) topical powder   Topical TID Meredeth Ide, MD      . polyethylene glycol (MIRALAX / GLYCOLAX) packet 17 g  17 g Oral Daily PRN Hillary Bow, DO      . polyvinyl alcohol (LIQUIFILM TEARS) 1.4 % ophthalmic solution 1 drop  1 drop Both Eyes QID Hillary Bow, DO   1 drop at 09/15/16 2110  . predniSONE (DELTASONE) tablet 40 mg  40 mg Oral Q breakfast Hillary Bow, DO   40 mg at 09/15/16 1033  . RESOURCE THICKENUP CLEAR 120 g  1 Can Oral PRN Hillary Bow, DO      . sertraline (ZOLOFT) tablet 100 mg  100 mg Oral Daily Lyda Perone M, DO   100 mg at 09/15/16 1033  . vancomycin (VANCOCIN) IVPB 1000 mg/200 mL premix  1,000 mg Intravenous Q24H Lorenza Evangelist, [facility]   Stopped at 09/16/16 0031    REVIEW OF SYSTEMS:  [X]  denotes positive finding, [ ]  denotes negative finding Cardiac  Comments:  Chest pain or chest pressure:    Shortness of breath upon exertion:    Short of breath when lying flat:    Irregular heart rhythm:        Vascular    Pain in calf, thigh, or hip brought on by ambulation:    Pain in feet at night that wakes you up from your sleep:  x   Blood clot in your veins:    Leg swelling:           PHYSICAL EXAM: Vitals:   09/15/16 1455 09/15/16 2005 09/15/16  2148 09/16/16 0618  BP:   (!) 154/72 (!) 177/83  Pulse:   73 78  Resp:   16 16  Temp:   98.1 F (36.7 C) 97.4 F (36.3 C)  TempSrc:   Oral Oral  SpO2: 97% 100% 95% 100%  Weight:      Height:        GENERAL: The patient is a well-nourished female, in no acute distress. The vital signs are documented above. CARDIOVASCULAR: No palpable pedal pulses bilaterally PULMONARY: There is good air exchange  MUSCULOSKELETAL: There are no major deformities or cyanosis. NEUROLOGIC: No focal weakness or paresthesias are detected. SKIN: Callus over her medial first metatarsal head on the left. Dry gangrene of her left second toe with some separation at the base of her second toe PSYCHIATRIC: The patient has a normal affect.  DATA:  Creatinine on 09/14/2016 was 1.02  MEDICAL ISSUES: No real progression of ischemia in her left foot. Does have mild to moderate pain associated with this. She would be at minimal risk for arteriography to see what could be done to improve flow to her foot. Would recommend arteriogram for further evaluation. Would recommend holding her Coumadin and plan arteriogram when INR is normalized. INR today is 2.07. Family not present this morning. Will discuss with the daughters as well.    Larina Earthly, MD FACS [facility] Tel [phone] Pager [phone]

## 2016-09-17 ENCOUNTER — Inpatient Hospital Stay (HOSPITAL_COMMUNITY): Payer: Medicare Other

## 2016-09-17 LAB — BLOOD GAS, ARTERIAL
Acid-Base Excess: 2.8 mmol/L — ABNORMAL HIGH (ref 0.0–2.0)
Bicarbonate: 26 mmol/L (ref 20.0–28.0)
Drawn by: 24610
FIO2: 50
O2 Saturation: 91.7 %
Patient temperature: 98.6
pCO2 arterial: 34.3 mmHg (ref 32.0–48.0)
pH, Arterial: 7.492 — ABNORMAL HIGH (ref 7.350–7.450)
pO2, Arterial: 59.7 mmHg — ABNORMAL LOW (ref 83.0–108.0)

## 2016-09-17 LAB — CBC
HCT: 33.5 % — ABNORMAL LOW (ref 36.0–46.0)
Hemoglobin: 10.9 g/dL — ABNORMAL LOW (ref 12.0–15.0)
MCH: 28.3 pg (ref 26.0–34.0)
MCHC: 32.5 g/dL (ref 30.0–36.0)
MCV: 87 fL (ref 78.0–100.0)
Platelets: 405 10*3/uL — ABNORMAL HIGH (ref 150–400)
RBC: 3.85 MIL/uL — ABNORMAL LOW (ref 3.87–5.11)
RDW: 16.9 % — ABNORMAL HIGH (ref 11.5–15.5)
WBC: 13 10*3/uL — ABNORMAL HIGH (ref 4.0–10.5)

## 2016-09-17 LAB — PROTIME-INR
INR: 1.96
Prothrombin Time: 22.6 seconds — ABNORMAL HIGH (ref 11.4–15.2)

## 2016-09-17 LAB — GLUCOSE, CAPILLARY
Glucose-Capillary: 329 mg/dL — ABNORMAL HIGH (ref 65–99)
Glucose-Capillary: 294 mg/dL — ABNORMAL HIGH (ref 65–99)
Glucose-Capillary: 268 mg/dL — ABNORMAL HIGH (ref 65–99)
Glucose-Capillary: 184 mg/dL — ABNORMAL HIGH (ref 65–99)

## 2016-09-17 MED ORDER — METHYLPREDNISOLONE SODIUM SUCC 125 MG IJ SOLR
60.0000 mg | Freq: Four times a day (QID) | INTRAMUSCULAR | Status: DC
  Administered 2016-09-17 – 2016-09-18 (×3): 60 mg via INTRAVENOUS
  Filled 2016-09-17 (×4): qty 2

## 2016-09-17 MED ORDER — GABAPENTIN 300 MG PO CAPS
600.0000 mg | ORAL_CAPSULE | Freq: Three times a day (TID) | ORAL | Status: DC
  Administered 2016-09-17 – 2016-09-27 (×27): 600 mg via ORAL
  Filled 2016-09-17 (×20): qty 2
  Filled 2016-09-17: qty 6
  Filled 2016-09-17 (×6): qty 2

## 2016-09-17 MED ORDER — METHYLPREDNISOLONE SODIUM SUCC 125 MG IJ SOLR
60.0000 mg | Freq: Two times a day (BID) | INTRAMUSCULAR | Status: DC
  Administered 2016-09-17: 60 mg via INTRAVENOUS
  Filled 2016-09-17: qty 2

## 2016-09-17 MED ORDER — LIDOCAINE HCL (PF) 1 % IJ SOLN
INTRAMUSCULAR | Status: AC
  Filled 2016-09-17: qty 10

## 2016-09-17 MED ORDER — ENOXAPARIN SODIUM 80 MG/0.8ML ~~LOC~~ SOLN
1.0000 mg/kg | SUBCUTANEOUS | Status: DC
  Administered 2016-09-17 – 2016-09-20 (×4): 65 mg via SUBCUTANEOUS
  Filled 2016-09-17 (×4): qty 0.8

## 2016-09-17 MED ORDER — IPRATROPIUM-ALBUTEROL 0.5-2.5 (3) MG/3ML IN SOLN
3.0000 mL | Freq: Three times a day (TID) | RESPIRATORY_TRACT | Status: DC
  Administered 2016-09-17 – 2016-09-25 (×22): 3 mL via RESPIRATORY_TRACT
  Filled 2016-09-17 (×22): qty 3

## 2016-09-17 MED ORDER — AZITHROMYCIN 500 MG PO TABS
500.0000 mg | ORAL_TABLET | Freq: Every day | ORAL | Status: DC
  Administered 2016-09-17 – 2016-09-18 (×2): 500 mg via ORAL
  Filled 2016-09-17 (×3): qty 1

## 2016-09-17 MED ORDER — FUROSEMIDE 20 MG PO TABS
20.0000 mg | ORAL_TABLET | Freq: Every day | ORAL | Status: DC
  Administered 2016-09-17 – 2016-09-18 (×2): 20 mg via ORAL
  Filled 2016-09-17 (×2): qty 1

## 2016-09-17 MED ORDER — WARFARIN - PHARMACIST DOSING INPATIENT: Freq: Every day | Status: DC

## 2016-09-17 MED ORDER — WARFARIN SODIUM 7.5 MG PO TABS
3.7500 mg | ORAL_TABLET | Freq: Once | ORAL | Status: AC
  Administered 2016-09-17: 3.75 mg via ORAL
  Filled 2016-09-17: qty 0.5

## 2016-09-17 NOTE — Plan of Care (Signed)
Problem: Safety: Goal: Ability to remain free from injury will improve Outcome: Progressing No falls during this admission. Call bell within reach. Bed in low and locked position. Low bed and floor mats in use. Nonskid footwear being utilized. 3/4 siderails in place. Patient alert and oriented. Clean and clear environment being maintained. Patient verbalized understanding of safety instruction.  Problem: Respiratory: Goal: Ability to maintain a clear airway will improve Outcome: Progressing Patient on 6L [place] O2. Patient does not wear oxygen at home. Patient pulse oximeter reading at 92% on 6L. Patient does have some expiratory wheezing and respiratory is following at this time. Patient receiving scheduled and PRN breathing treatments at this time.

## 2016-09-17 NOTE — Progress Notes (Signed)
PROGRESS NOTE    Shannon [name]  JXB:[id number] DOB: 07-Feb-1924 DOA: 09/14/2016 PCP: Kirt Boys, DO     Brief Narrative:  Shannon [name] is a 83yo female with medical history significant for type 2 diabetes, COPD, hypertension, CKD stage III, CHF, diabetic foot ulcers. She presented to the emergency department complaining of cough, shortness of breath that started several days ago. She was admitted to the hospital for further treatment for COPD exacerbation. On admission, gangrene of the left second toe was noted. Vascular surgery was consulted, who recommended patient transferred to [facility] for further evaluation, treatment, possibly amputation. Previously, patient had followed up in the office in April 2018 and at that time, she had refused surgery, however, currently more agreeable to surgical intervention. However, due to her worsening respiratory status, any further procedures are deferred.  Assessment & Plan:   Principal Problem:   COPD exacerbation (HCC) Active Problems:   Diabetes mellitus type 2 in nonobese (HCC)   Chronic kidney insufficiency, stage 3 (moderate)   Accelerated hypertension   Hyponatremia   Chronic diastolic CHF (congestive heart failure) (HCC)   Atherosclerosis of native arteries of the extremities with gangrene (HCC)   Gangrene of toe of left foot (HCC)   Acute hypoxemic respiratory failure -Continue nasal cannula oxygen/venti mask  -Worse this morning, we discussed CODE STATUS. She continues to be DO NOT INTUBATE, however, is willing to try BiPAP. Discussed this also with family members over the phone today. -ABG obtained today with respiratory alkalosis, hypoxemia -Chest x-ray obtained today with worsening left-sided pleural effusion, ultrasound guided thoracentesis ordered -On reevaluation later this morning, patient's breathing status stable for now.  COPD exacerbation  -Change prednisone to IV Solu-Medrol -Continue DuoNeb -Antibiotics changed to  Zithromax  Gangrene of second toe left foot -Vascular surgery following -Plan for arteriogram once respiratory status stabilizes, nonemergent   Essential hypertension -Continue Coreg  Chronic diastolic heart failure -Continue coreg, imdur, lasix  -Stable, not volume overloaded on exam   Diabetes mellitus with neuropathy -Sliding-scale insulin -Continue neurontin   Chronic kidney disease stage III -Stable  Hx DVT -Restart coumadin as further procedure plan is on hold due to respiratory status   Chest pressure -Has had left-sided chest pressure on and off over the past week, currently symptom free -Echocardiogram completed 05/09/2016 , with EF 60%, no regional wall motion abnormalities -Check troponin, EKG if symptom recurs   HLD  -Continue lipitor  PEG in place -Dietitian consult   Hypothyroidism -Continue synthroid    DVT prophylaxis: resume coumadin / lovenox  Code Status: DNR Family Communication: daughter updated over the phone  Disposition Plan: pending further work up   Consultants:   Vascular surgery  Procedures:   None  Antimicrobials:  Anti-infectives    Start     Dose/Rate Route Frequency Ordered Stop   09/17/16 1000  azithromycin (ZITHROMAX) tablet 500 mg     500 mg Oral Daily 09/17/16 0938     09/15/16 2200  vancomycin (VANCOCIN) IVPB 1000 mg/200 mL premix  Status:  Discontinued     1,000 mg 200 mL/hr over 60 Minutes Intravenous Every 24 hours 09/15/16 0000 09/17/16 0938   09/15/16 1000  cefTRIAXone (ROCEPHIN) 2 g in dextrose 5 % 50 mL IVPB  Status:  Discontinued     2 g 100 mL/hr over 30 Minutes Intravenous Every 24 hours 09/15/16 0122 09/17/16 0938   09/15/16 0000  metroNIDAZOLE (FLAGYL) IVPB 500 mg  Status:  Discontinued     500 mg 100  mL/hr over 60 Minutes Intravenous Every 8 hours 09/14/16 2350 09/17/16 0938   09/14/16 2230  ceFEPIme (MAXIPIME) 2 g in dextrose 5 % 50 mL IVPB     2 g 100 mL/hr over 30 Minutes Intravenous NOW 09/14/16  2215 09/15/16 0128   09/14/16 2145  levofloxacin (LEVAQUIN) IVPB 750 mg  Status:  Discontinued     750 mg 100 mL/hr over 90 Minutes Intravenous  Once 09/14/16 2138 09/14/16 2215   09/14/16 2145  vancomycin (VANCOCIN) IVPB 1000 mg/200 mL premix     1,000 mg 200 mL/hr over 60 Minutes Intravenous  Once 09/14/16 2138 09/15/16 0046         Subjective: Complaining of shortness of breath. No chest pains. Has had some cough, unable to bring up. No fevers overnight. Tolerating tube feedings, no abdominal pain or nausea or vomiting.  Objective: Vitals:   09/17/16 0621 09/17/16 0730 09/17/16 0815 09/17/16 0825  BP: (!) 198/81 (!) 207/90    Pulse: 89   94  Resp: (!) 22   (!) 22  Temp: 98.9 F (37.2 C)     TempSrc: Oral     SpO2: 93%  93% 94%  Weight:      Height:        Intake/Output Summary (Last 24 hours) at 09/17/16 1337 Last data filed at 09/17/16 0600  Gross per 24 hour  Intake          1330.25 ml  Output              400 ml  Net           930.25 ml   Filed Weights   09/15/16 0212  Weight: 63.6 kg (140 lb 3.4 oz)    Examination:  General exam: Appears labored  Respiratory system: +On venti mask, wheezes bilaterally Cardiovascular system: S1 & S2 heard, RRR. No JVD, murmurs, rubs, gallops or clicks. No pedal edema. Gastrointestinal system: Abdomen is nondistended, soft and nontender. No organomegaly or masses felt. Normal bowel sounds heard. Central nervous system: Alert and oriented. No focal neurological deficits. Extremities: Symmetric Skin: + Left second toe with gangrene Psychiatry: Judgement and insight appear normal. Mood & affect appropriate.   Data Reviewed: I have personally reviewed following labs and imaging studies  CBC:  Recent Labs Lab 09/14/16 2140 09/16/16 0458 09/17/16 1233  WBC 10.6* 13.0* 13.0*  NEUTROABS 9.3*  --   --   HGB 10.6* 9.8* 10.9*  HCT 31.0* 29.7* 33.5*  MCV 85.9 85.8 87.0  PLT 342 362 405*   Basic Metabolic Panel:  Recent  Labs Lab 09/14/16 2140  NA 129*  K 4.3  CL 94*  CO2 24  GLUCOSE 154*  BUN 38*  CREATININE 1.02*  CALCIUM 9.2   GFR: Estimated Creatinine Clearance: 33.6 mL/min (A) (by C-G formula based on SCr of 1.02 mg/dL (H)). Liver Function Tests: No results for input(s): AST, ALT, ALKPHOS, BILITOT, PROT, ALBUMIN in the last 168 hours. No results for input(s): LIPASE, AMYLASE in the last 168 hours. No results for input(s): AMMONIA in the last 168 hours. Coagulation Profile:  Recent Labs Lab 09/13/16 1208 09/15/16 0752 09/16/16 0458  INR 2.9 2.04 2.07   Cardiac Enzymes:  Recent Labs Lab 09/14/16 2140 09/15/16 0745 09/15/16 1445  TROPONINI 0.03* <0.03 <0.03   BNP (last 3 results)  Recent Labs  05/08/16 1053  PROBNP 1,132*   HbA1C: No results for input(s): HGBA1C in the last 72 hours. CBG:  Recent Labs Lab 09/16/16 1232  09/16/16 1710 09/16/16 2059 09/17/16 0803 09/17/16 1217  GLUCAP 239* 245* 153* 329* 294*   Lipid Profile: No results for input(s): CHOL, HDL, LDLCALC, TRIG, CHOLHDL, LDLDIRECT in the last 72 hours. Thyroid Function Tests: No results for input(s): TSH, T4TOTAL, FREET4, T3FREE, THYROIDAB in the last 72 hours. Anemia Panel: No results for input(s): VITAMINB12, FOLATE, FERRITIN, TIBC, IRON, RETICCTPCT in the last 72 hours. Sepsis Labs:  Recent Labs Lab 09/14/16 2147  LATICACIDVEN 0.87    Recent Results (from the past 240 hour(s))  Culture, blood (Routine X 2) w Reflex to ID Panel     Status: None (Preliminary result)   Collection Time: 09/14/16  9:48 PM  Result Value Ref Range Status   Specimen Description BLOOD RIGHT ANTECUBITAL  Final   Special Requests   Final    BOTTLES DRAWN AEROBIC AND ANAEROBIC Blood Culture adequate volume   Culture   Final    NO GROWTH 2 DAYS Performed at [facility], [address], [place], [place] [address]    Report Status PENDING  Incomplete  Culture, blood (Routine X 2) w Reflex to ID Panel      Status: None (Preliminary result)   Collection Time: 09/14/16 11:25 PM  Result Value Ref Range Status   Specimen Description BLOOD BLOOD LEFT HAND  Final   Special Requests   Final    BOTTLES DRAWN AEROBIC AND ANAEROBIC Blood Culture adequate volume   Culture   Final    NO GROWTH 2 DAYS Performed at [facility], [address], [place], [place] [address]    Report Status PENDING  Incomplete  MRSA PCR Screening     Status: None   Collection Time: 09/15/16  5:16 PM  Result Value Ref Range Status   MRSA by PCR NEGATIVE NEGATIVE Final    Comment:        The GeneXpert MRSA Assay (FDA approved for NASAL specimens only), is one component of a comprehensive MRSA colonization surveillance program. It is not intended to diagnose MRSA infection nor to guide or monitor treatment for MRSA infections.        Radiology Studies: Dg Chest Port 1 View  Result Date: 09/17/2016 CLINICAL DATA:  Respiratory failure EXAM: PORTABLE CHEST 1 VIEW COMPARISON:  September 14, 2016 FINDINGS: No pneumothorax. The right lung is clear. Increasing effusion underlying opacity on the left. Shift of the heart and mediastinum to the left may represent some underlying volume loss is well. No other interval changes. IMPRESSION: Increasing effusion and underlying opacity on the left with possible associated volume loss as above. Recommend follow-up to resolution. Electronically Signed   By: Gerome Sam III M.D   On: 09/17/2016 11:20      Scheduled Meds: . atorvastatin  10 mg Oral q1800  . azithromycin  500 mg Oral Daily  . calcium-vitamin D  1 tablet Oral Q breakfast  . carvedilol  6.25 mg Oral BID  . chlorhexidine  15 mL Mouth/Throat BID  . cycloSPORINE  1 drop Both Eyes BID  . enoxaparin (LOVENOX) injection  1 mg/kg Subcutaneous Q24H  . feeding supplement (NEPRO CARB STEADY)  237 mL Oral BID BM  . feeding supplement (PRO-STAT SUGAR FREE 64)  30 mL Oral BID  . fluticasone  1 spray Each Nare BID  .  free water  230 mL Per Tube BID  . furosemide  20 mg Oral Daily  . gabapentin  600 mg Oral TID  . insulin aspart  0-20 Units  Subcutaneous TID WC  . ipratropium-albuterol  3 mL Nebulization TID  . isosorbide mononitrate  30 mg Oral Daily  . latanoprost  1 drop Both Eyes QHS  . levothyroxine  137 mcg Oral QAC breakfast  . methylPREDNISolone (SOLU-MEDROL) injection  60 mg Intravenous Q6H  . multivitamin with minerals  1 tablet Oral Daily  . nystatin   Topical TID  . polyvinyl alcohol  1 drop Both Eyes QID  . sertraline  100 mg Oral Daily   Continuous Infusions: . sodium chloride 10 mL/hr (09/15/16 2048)  . feeding supplement (OSMOLITE 1.5 CAL) Stopped (09/17/16 1032)     LOS: 2 days    Time spent: 40 minutes   Noralee Stain, DO [facility] [email or web address] Password [email or web address] 09/17/2016, 1:37 PM

## 2016-09-17 NOTE — Progress Notes (Signed)
Placed back 6 lpm/[place],maintaining  92% of saturation,not using much of her accessory muscles on her breathing.Able to feed herself during lunch and dinner of more than 60% food intake.Will monitor.

## 2016-09-17 NOTE — Progress Notes (Signed)
RT called to patient room due to patient saying she was short of breath.  Upon arrival patient was on 5L nasal cannula and sats were reading at 87% and patient noted to be in distress.  Went to give patient a PRN albuterol treatment and sats improved to 94% during treatment.  Placed patient on a 50% venturi mask post treatment and sats maintained at 93-94%.  Patient stated that she was starting to feel better.  Stated that it happened all of a sudden.  Upon auscultation patient sounded clear however could hear faint crackles throughout.  Patient work of breathing has eased slightly.  Will continue to monitor.

## 2016-09-17 NOTE — Progress Notes (Signed)
Subjective: Interval History: none.. Much more work of breathing this morning compared to yesterday. Reports that she had difficult time resting last night due to shortness of breath.  Objective: Vital signs in last 24 hours: Temp:  [97.9 F (36.6 C)-98.9 F (37.2 C)] 98.9 F (37.2 C) (06/17 0621) Pulse Rate:  [74-94] 94 (06/17 0825) Resp:  [18-22] 22 (06/17 0825) BP: (162-207)/(71-90) 207/90 (06/17 0730) SpO2:  [92 %-95 %] 94 % (06/17 0825) FiO2 (%):  [50 %] 50 % (06/17 0825)  Intake/Output from previous day: 06/16 0701 - 06/17 0700 In: 1930.3 [P.O.:960; I.V.:173; NG/GT:497.3; IV Piggyback:300] Out: 650 [Urine:650] Intake/Output this shift: No intake/output data recorded.  No change in her foot with dry gangrenous second toe  Lab Results:  Recent Labs  09/14/16 2140 09/16/16 0458  WBC 10.6* 13.0*  HGB 10.6* 9.8*  HCT 31.0* 29.7*  PLT 342 362   BMET  Recent Labs  09/14/16 2140  NA 129*  K 4.3  CL 94*  CO2 24  GLUCOSE 154*  BUN 38*  CREATININE 1.02*  CALCIUM 9.2    Studies/Results: Dg Chest 2 View  Result Date: 09/14/2016 CLINICAL DATA:  Shortness of breath EXAM: CHEST  2 VIEW COMPARISON:  Aug 16, 2016 FINDINGS: The heart size and mediastinal contours are within normal limits. There is minimal left pleural effusion. There is no focal pneumonia or pulmonary edema. The visualized skeletal structures are unremarkable. IMPRESSION: No focal pneumonia.  Minimal left pleural effusion. Electronically Signed   By: Abelardo Diesel M.D.   On: 09/14/2016 21:04   Anti-infectives: Anti-infectives    Start     Dose/Rate Route Frequency Ordered Stop   09/15/16 2200  vancomycin (VANCOCIN) IVPB 1000 mg/200 mL premix     1,000 mg 200 mL/hr over 60 Minutes Intravenous Every 24 hours 09/15/16 0000     09/15/16 1000  cefTRIAXone (ROCEPHIN) 2 g in dextrose 5 % 50 mL IVPB     2 g 100 mL/hr over 30 Minutes Intravenous Every 24 hours 09/15/16 0122     09/15/16 0000  metroNIDAZOLE  (FLAGYL) IVPB 500 mg     500 mg 100 mL/hr over 60 Minutes Intravenous Every 8 hours 09/14/16 2350     09/14/16 2230  ceFEPIme (MAXIPIME) 2 g in dextrose 5 % 50 mL IVPB     2 g 100 mL/hr over 30 Minutes Intravenous NOW 09/14/16 2215 09/15/16 0128   09/14/16 2145  levofloxacin (LEVAQUIN) IVPB 750 mg  Status:  Discontinued     750 mg 100 mL/hr over 90 Minutes Intravenous  Once 09/14/16 2138 09/14/16 2215   09/14/16 2145  vancomycin (VANCOCIN) IVPB 1000 mg/200 mL premix     1,000 mg 200 mL/hr over 60 Minutes Intravenous  Once 09/14/16 2138 09/15/16 0046      Assessment/Plan: s/p * No surgery found * Chronic ischemia with dry gangrene left second toe. We will certainly defer any consideration of arteriogram with her current medical status. This is been chronic for several months. She is not septic or toxic from this dry gangrenous changes on her toe with some slight erythema at the base. Following with you.   LOS: 2 days   Curt Jews 09/17/2016, 8:44 AM

## 2016-09-17 NOTE — Progress Notes (Addendum)
ANTICOAGULATION CONSULT NOTE - Follow Up Consult  Pharmacy Consult for heparin and warfarin Indication: h/o DVT  Labs:  Recent Labs  09/14/16 2140 09/15/16 0745 09/15/16 0752 09/15/16 1445 09/16/16 0458 09/16/16 1455 09/17/16 1233  HGB 10.6*  --   --   --  9.8*  --  10.9*  HCT 31.0*  --   --   --  29.7*  --  33.5*  PLT 342  --   --   --  362  --  405*  LABPROT  --   --  23.3*  --  23.6*  --  22.6*  INR  --   --  2.04  --  2.07  --  1.96  HEPARINUNFRC  --   --   --   --  0.13* 0.22*  --   CREATININE 1.02*  --   --   --   --   --   --   TROPONINI 0.03* <0.03  --  <0.03  --   --   --     Assessment: 83yo female on heparin while warfarin on hold an anticipation of procedure. Procedure now deferred and pharmacy consulted to restart warfarin. Patient is very difficult stick so heparin changed to lovenox per pharmacy.   PTA warfarin dose: 2.5 mg on Mondays, 3.75 mg AOD  INR subtherapeutic today at 1.96. Poor PO intake. New DDIs noted with azithromycin and steroids and warfarin which will likely potentiate the effects of warfarin.  Hgb up to 10.9, PLTC up to 405- no bleeding noted Per last labs, pt's CrCl ~30. Given age and body weight, will dose enoxaparin conservatively.  Goal of Therapy:  Heparin level 0.3-0.7 units/ml  INR goal 2-3   Plan:  D/c heparin Start lovenox 1 mg/kg q24 h Warfarin 3.75 mg x1 Daily INR, CBC F/u d/c lovenox when INR therapeutic   Carlean Jews, Pharm.D. PGY1 Pharmacy Resident 6/17/20182:09 PM Pager [phone]

## 2016-09-17 NOTE — Progress Notes (Signed)
M.D.at the bedside to re-check patient's respiratory status.Patient verbalized she is getting better to [facility].D.No transfer order yet as of this time.

## 2016-09-18 LAB — GLUCOSE, CAPILLARY
Glucose-Capillary: 102 mg/dL — ABNORMAL HIGH (ref 65–99)
Glucose-Capillary: 105 mg/dL — ABNORMAL HIGH (ref 65–99)
Glucose-Capillary: 106 mg/dL — ABNORMAL HIGH (ref 65–99)
Glucose-Capillary: 115 mg/dL — ABNORMAL HIGH (ref 65–99)
Glucose-Capillary: 154 mg/dL — ABNORMAL HIGH (ref 65–99)
Glucose-Capillary: 163 mg/dL — ABNORMAL HIGH (ref 65–99)
Glucose-Capillary: 262 mg/dL — ABNORMAL HIGH (ref 65–99)
Glucose-Capillary: 312 mg/dL — ABNORMAL HIGH (ref 65–99)
Glucose-Capillary: 332 mg/dL — ABNORMAL HIGH (ref 65–99)
Glucose-Capillary: 360 mg/dL — ABNORMAL HIGH (ref 65–99)
Glucose-Capillary: 365 mg/dL — ABNORMAL HIGH (ref 65–99)
Glucose-Capillary: 435 mg/dL — ABNORMAL HIGH (ref 65–99)
Glucose-Capillary: 438 mg/dL — ABNORMAL HIGH (ref 65–99)
Glucose-Capillary: 447 mg/dL — ABNORMAL HIGH (ref 65–99)

## 2016-09-18 LAB — PROTIME-INR
INR: 1.81
Prothrombin Time: 21.2 seconds — ABNORMAL HIGH (ref 11.4–15.2)

## 2016-09-18 LAB — BLOOD CULTURE ID PANEL (REFLEXED)

## 2016-09-18 LAB — BASIC METABOLIC PANEL
Anion gap: 10 (ref 5–15)
Anion gap: 13 (ref 5–15)
Anion gap: 10 (ref 5–15)
Anion gap: 9 (ref 5–15)
Anion gap: 8 (ref 5–15)
BUN: 48 mg/dL — ABNORMAL HIGH (ref 6–20)
BUN: 54 mg/dL — ABNORMAL HIGH (ref 6–20)
BUN: 55 mg/dL — ABNORMAL HIGH (ref 6–20)
BUN: 53 mg/dL — ABNORMAL HIGH (ref 6–20)
BUN: 54 mg/dL — ABNORMAL HIGH (ref 6–20)
CO2: 25 mmol/L (ref 22–32)
CO2: 20 mmol/L — ABNORMAL LOW (ref 22–32)
CO2: 23 mmol/L (ref 22–32)
CO2: 26 mmol/L (ref 22–32)
CO2: 26 mmol/L (ref 22–32)
Calcium: 8.9 mg/dL (ref 8.9–10.3)
Calcium: 9.1 mg/dL (ref 8.9–10.3)
Calcium: 9 mg/dL (ref 8.9–10.3)
Calcium: 9.2 mg/dL (ref 8.9–10.3)
Calcium: 9.2 mg/dL (ref 8.9–10.3)
Chloride: 99 mmol/L — ABNORMAL LOW (ref 101–111)
Chloride: 102 mmol/L (ref 101–111)
Chloride: 103 mmol/L (ref 101–111)
Chloride: 104 mmol/L (ref 101–111)
Chloride: 104 mmol/L (ref 101–111)
Creatinine, Ser: 1.22 mg/dL — ABNORMAL HIGH (ref 0.44–1.00)
Creatinine, Ser: 1.28 mg/dL — ABNORMAL HIGH (ref 0.44–1.00)
Creatinine, Ser: 1.21 mg/dL — ABNORMAL HIGH (ref 0.44–1.00)
Creatinine, Ser: 1.08 mg/dL — ABNORMAL HIGH (ref 0.44–1.00)
Creatinine, Ser: 1.12 mg/dL — ABNORMAL HIGH (ref 0.44–1.00)
GFR calc Af Amer: 44 mL/min — ABNORMAL LOW (ref >60)
GFR calc Af Amer: 41 mL/min — ABNORMAL LOW (ref >60)
GFR calc Af Amer: 44 mL/min — ABNORMAL LOW (ref >60)
GFR calc Af Amer: 50 mL/min — ABNORMAL LOW (ref >60)
GFR calc Af Amer: 48 mL/min — ABNORMAL LOW (ref >60)
GFR calc non Af Amer: 38 mL/min — ABNORMAL LOW (ref >60)
GFR calc non Af Amer: 35 mL/min — ABNORMAL LOW (ref >60)
GFR calc non Af Amer: 38 mL/min — ABNORMAL LOW (ref >60)
GFR calc non Af Amer: 44 mL/min — ABNORMAL LOW (ref >60)
GFR calc non Af Amer: 42 mL/min — ABNORMAL LOW (ref >60)
Glucose, Bld: 419 mg/dL — ABNORMAL HIGH (ref 65–99)
Glucose, Bld: 422 mg/dL — ABNORMAL HIGH (ref 65–99)
Glucose, Bld: 357 mg/dL — ABNORMAL HIGH (ref 65–99)
Glucose, Bld: 137 mg/dL — ABNORMAL HIGH (ref 65–99)
Glucose, Bld: 159 mg/dL — ABNORMAL HIGH (ref 65–99)
Potassium: 4 mmol/L (ref 3.5–5.1)
Potassium: 3.9 mmol/L (ref 3.5–5.1)
Potassium: 4.3 mmol/L (ref 3.5–5.1)
Potassium: 3.9 mmol/L (ref 3.5–5.1)
Potassium: 4 mmol/L (ref 3.5–5.1)
Sodium: 134 mmol/L — ABNORMAL LOW (ref 135–145)
Sodium: 135 mmol/L (ref 135–145)
Sodium: 136 mmol/L (ref 135–145)
Sodium: 139 mmol/L (ref 135–145)
Sodium: 138 mmol/L (ref 135–145)

## 2016-09-18 LAB — CBC
HCT: 32.9 % — ABNORMAL LOW (ref 36.0–46.0)
Hemoglobin: 10.5 g/dL — ABNORMAL LOW (ref 12.0–15.0)
MCH: 28.5 pg (ref 26.0–34.0)
MCHC: 31.9 g/dL (ref 30.0–36.0)
MCV: 89.2 fL (ref 78.0–100.0)
Platelets: 394 10*3/uL (ref 150–400)
RBC: 3.69 MIL/uL — ABNORMAL LOW (ref 3.87–5.11)
RDW: 17.4 % — ABNORMAL HIGH (ref 11.5–15.5)
WBC: 12.9 10*3/uL — ABNORMAL HIGH (ref 4.0–10.5)

## 2016-09-18 MED ORDER — SODIUM CHLORIDE 0.9 % IV SOLN: INTRAVENOUS | Status: DC

## 2016-09-18 MED ORDER — POTASSIUM CHLORIDE 10 MEQ/100ML IV SOLN
10.0000 meq | INTRAVENOUS | Status: AC
  Administered 2016-09-18 (×2): 10 meq via INTRAVENOUS
  Filled 2016-09-18 (×2): qty 100

## 2016-09-18 MED ORDER — DEXTROSE-NACL 5-0.45 % IV SOLN
INTRAVENOUS | Status: DC
  Administered 2016-09-18: 18:00:00 via INTRAVENOUS

## 2016-09-18 MED ORDER — MOMETASONE FURO-FORMOTEROL FUM 200-5 MCG/ACT IN AERO
2.0000 | INHALATION_SPRAY | Freq: Two times a day (BID) | RESPIRATORY_TRACT | Status: DC
  Administered 2016-09-18 – 2016-09-27 (×15): 2 via RESPIRATORY_TRACT
  Filled 2016-09-18 (×4): qty 8.8

## 2016-09-18 MED ORDER — MORPHINE SULFATE (CONCENTRATE) 10 MG/0.5ML PO SOLN
5.0000 mg | ORAL | Status: DC | PRN
  Administered 2016-09-18: 5 mg via ORAL
  Filled 2016-09-18: qty 0.5

## 2016-09-18 MED ORDER — INSULIN GLARGINE 100 UNIT/ML ~~LOC~~ SOLN
12.0000 [IU] | Freq: Once | SUBCUTANEOUS | Status: AC
  Administered 2016-09-18: 12 [IU] via SUBCUTANEOUS
  Filled 2016-09-18: qty 0.12

## 2016-09-18 MED ORDER — SODIUM CHLORIDE 0.9 % IV SOLN
INTRAVENOUS | Status: DC
  Administered 2016-09-18: 12:00:00 via INTRAVENOUS
  Filled 2016-09-18: qty 1

## 2016-09-18 MED ORDER — PREDNISONE 20 MG PO TABS
40.0000 mg | ORAL_TABLET | Freq: Every day | ORAL | Status: DC
  Administered 2016-09-19: 40 mg via ORAL
  Filled 2016-09-18: qty 2

## 2016-09-18 MED ORDER — WARFARIN SODIUM 5 MG PO TABS: 5.0000 mg | ORAL_TABLET | Freq: Once | ORAL | Status: DC

## 2016-09-18 MED ORDER — INSULIN GLARGINE 100 UNIT/ML ~~LOC~~ SOLN
10.0000 [IU] | Freq: Every day | SUBCUTANEOUS | Status: DC
  Administered 2016-09-18: 10 [IU] via SUBCUTANEOUS
  Filled 2016-09-18: qty 0.1

## 2016-09-18 MED ORDER — INSULIN ASPART 100 UNIT/ML ~~LOC~~ SOLN
15.0000 [IU] | SUBCUTANEOUS | Status: AC
  Administered 2016-09-18: 15 [IU] via SUBCUTANEOUS

## 2016-09-18 MED ORDER — SODIUM CHLORIDE 0.9 % IV SOLN
INTRAVENOUS | Status: DC
  Administered 2016-09-18: 12:00:00 via INTRAVENOUS

## 2016-09-18 NOTE — Progress Notes (Signed)
[facility]  Patient Status: Pt is active [place] with [facility]  [facility] is providing the following services: RN Pt is currently being followed at home as well by [facility] [phone]  If patient discharges after hours, please call [phone].   Burman Nieves, MPH SW Navigator [place] [phone] 09/18/2016, 8:58 AM

## 2016-09-18 NOTE — Progress Notes (Addendum)
ANTICOAGULATION CONSULT NOTE - Follow Up Consult  Pharmacy Consult for Loenox and warfarin Indication: h/o DVT  Labs:  Recent Labs  09/15/16 1445  09/16/16 0458 09/16/16 1455 09/17/16 1233 09/18/16 0655  HGB  --   < > 9.8*  --  10.9* 10.5*  HCT  --   --  29.7*  --  33.5* 32.9*  PLT  --   --  362  --  405* 394  LABPROT  --   --  23.6*  --  22.6* 21.2*  INR  --   --  2.07  --  1.96 1.81  HEPARINUNFRC  --   --  0.13* 0.22*  --   --   CREATININE  --   --   --   --   --  1.22*  TROPONINI <0.03  --   --   --   --   --   < > = values in this interval not displayed.  Assessment: 83yo female on heparin while warfarin on hold an anticipation of procedure. Procedure now deferred and pharmacy consulted to restart warfarin. Patient is very difficult stick so heparin changed to lovenox per pharmacy.   PTA warfarin dose: 2.5 mg on Mondays, 3.75 mg AOD  INR = 1.81 today  Goal of Therapy:  Heparin level 0.3-0.7 units/ml  INR goal 2-3   Plan:  Warfarin 5 mg po x 1 Lovenox 65 mg sq Q 24 hours Follow up AM INR  Thank you Anette Guarneri, PharmD [phone]  6/18/20188:49 AM   Addendum  15:30 pm: now planning arteriogram, hold warfarin  Thank you Anette Guarneri, PharmD [phone]

## 2016-09-18 NOTE — Progress Notes (Signed)
Pt had a BG of 435 at 0722. Dr. Pilar Jarvis notified as he called back to verify positive blood cultures. He verbalized to give 20 units of insulin now, recheck in 30 minutes and page him if still >400. Angela Nevin, RN notified.

## 2016-09-18 NOTE — Progress Notes (Signed)
Patient ID: Shannon [name], female   DOB: Dec 26, 1923, 83 y.o.   MRN: [id number] Change in her left foot. Dry gangrene of her left second toe. Minimal discomfort. No surrounding erythema.  I think again discussed options with the patient and also with her daughter Shannon [name] by telephone. We will hold Coumadin and plan arteriogram later in the week. Explained that she may require extensive bypass for revascularization which I would recommend against with herage and medical difficulties. May have less invasive endovascular option. The way of knowing without arteriography. Creatinine remains 1.2. Feel that she can safely have left leg arteriography with minimal renal risks to have the answers for appropriate way to proceed. As the patient and her daughter wish to do this.  Respiratory status seems much more stable than it was yesterday morning. INR currently 1.8. We will plan arteriography later in the week if she continues to stabilize from a cardiac and pulmonary standpoint

## 2016-09-18 NOTE — Consult Note (Addendum)
WOC consult requested for necrotic  left 2nd toe. This was already performed on 6/15; refer to previous progress note.  Vascular team is now following for assessment and plan of care. Topical treatment will not be effective to promote healing.  Please refer to them for further questions.   Please re-consult if further assistance is needed.  Thank-you,  Julien Girt MSN, [place], [place], [place], [place]

## 2016-09-18 NOTE — Progress Notes (Signed)
Cyclic tube feedings started late at 0330. Dr. Olevia Bowens notified of them not being started at 44 and verbalized to go ahead and start the tube feedings now. Pt's VSS and will continue to monitor.

## 2016-09-18 NOTE — Progress Notes (Signed)
CRITICAL VALUE STICKER  CRITICAL VALUE: positive blood cultures  DATE & TIME NOTIFIED: 6/18 at 0650  MD NOTIFIED: Dr. Verlon Au   TIME OF NOTIFICATION: Paged via [place] at [place] back to verify result and verbalize he would look more into pt's chart and put in orders later.

## 2016-09-18 NOTE — Progress Notes (Addendum)
PROGRESS NOTE    Shannon [name]  ZOX:[id number] DOB: 09-17-1923 DOA: 09/14/2016 PCP: Kirt Boys, DO  Outpatient Specialists:     Brief Narrative:   17 fem CKD III Afib CHad2vasc2 = 6 + Tachybradysyndrome COPD DVT in past CVA Chr L Cerebellear infracts HTN ty II DM GO Obstrution s/p G tube HLD GERD Diastolic HF hypothryoid Pressure ulcer  This is her 8th recent admit She has been offered pallaitive care in the past as her condition is the sum of her mutiple medical issues issues  prior unwilling to undergo amoputation and wishes to try medication only   Assessment & Plan:   Principal Problem:   COPD exacerbation (HCC) Active Problems:   Diabetes mellitus type 2 in nonobese (HCC)   Chronic kidney insufficiency, stage 3 (moderate)   Accelerated hypertension   Hyponatremia   Chronic diastolic CHF (congestive heart failure) (HCC)   Atherosclerosis of native arteries of the extremities with gangrene (HCC)   Gangrene of toe of left foot (HCC)  Respiratory Acute hypoxemic respiratory failure--not on Home O2 COPD exacerbation Left-sided pleural effusion-tiny and non-amenable to [facility] 6/17  ABG 6/17 on venturi mask 50% shows alkalosis--?anxious--add roxanol 5 q 4 prn SOB  wean steroids IV to prednisone [no wheeze]  She is currently 90% on 6 L of oxygen---Sats 88-90 ok c me-wean asa tol  Continue albuterol 2.5 every 2 when necessary, Flonase 50 g twice a day-adding Dulera this morning 6/18 and considering Brovana  Cont Azithromycin till 6/18 then stop  L foot gangrene Prior DVT  Await hemodyn stability and then consider Angio  INR 1.8 -defer to Vasc if ok for procedure  Afgib CHad2 Vasc2 6 Tachybrady Diastolic CHF EF 60-65% CAD  Cont Coreg 6.25, IMdur 30 xl  Cont lasix 20 daily  GO Obst 09/2015 GERd  Cont Tube feeds in pm  Can take PO liquids--cont free water 230 ml bid  Cont MirLAX  Dm tY ii C/B NEPHRO/NEUROPATHY  Has ischemic toe.  Need Angiogram but  not hemodyn stable as yet  No need abx currently--TOe visualized by me 6/18 and demarcating gangrene  Defer timing and Coumadin to Vascular  Cont gabapentin 600 tid--monitor bmet As hyperglycemic 6/18 with 400 range cbg, given extra 35 U reg insulin stat R/o Acidosis and confirm need insulin gtt  CKD stg III   basleine GFR anywhere 25-40  Cont lasix for now   CVA c Chr Infarcts       SCD and coumadin Partial code Inpatient pedning resolution dispo hard--might need pallaitive re-discussions again.  Unclear when d/c   Consultants:   vasc  Procedures:   None   Antimicrobials:   Cefepime and flagyll 6/14-->6/17  azithro 6/14-->6/18    Subjective:  Well Visibly anxious Some dyspnea No cp eating some Verbalizing full sentences   Objective: Vitals:   09/17/16 2245 09/18/16 0416 09/18/16 0600 09/18/16 0809  BP: (!) 166/68 (!) 167/66 (!) 155/60   Pulse: (!) 56 69    Resp: (!) 21 20    Temp: 98.4 F (36.9 C) 97.7 F (36.5 C)    TempSrc: Oral Oral    SpO2: 99% 99%  90%  Weight:      Height:        Intake/Output Summary (Last 24 hours) at 09/18/16 0845 Last data filed at 09/17/16 1900  Gross per 24 hour  Intake             1200 ml  Output  0 ml  Net             1200 ml   Filed Weights   09/15/16 0212  Weight: 63.6 kg (140 lb 3.4 oz)    Examination:  eomi ncat No pallor no ict s1 s2 coarse BS, no wheeze s1 s 2no m/r/g abd slight distended L 2nd toe moist at base--otherwise dry and demarcating Neuro intact    Data Reviewed: I have personally reviewed following labs and imaging studies  CBC:  Recent Labs Lab 09/14/16 2140 09/16/16 0458 09/17/16 1233 09/18/16 0655  WBC 10.6* 13.0* 13.0* 12.9*  NEUTROABS 9.3*  --   --   --   HGB 10.6* 9.8* 10.9* 10.5*  HCT 31.0* 29.7* 33.5* 32.9*  MCV 85.9 85.8 87.0 89.2  PLT 342 362 405* 394   Basic Metabolic Panel:  Recent Labs Lab 09/14/16 2140 09/18/16 0655  NA 129* 134*  K  4.3 4.0  CL 94* 99*  CO2 24 25  GLUCOSE 154* 419*  BUN 38* 48*  CREATININE 1.02* 1.22*  CALCIUM 9.2 8.9   GFR: Estimated Creatinine Clearance: 28.1 mL/min (A) (by C-G formula based on SCr of 1.22 mg/dL (H)). Liver Function Tests: No results for input(s): AST, ALT, ALKPHOS, BILITOT, PROT, ALBUMIN in the last 168 hours. No results for input(s): LIPASE, AMYLASE in the last 168 hours. No results for input(s): AMMONIA in the last 168 hours. Coagulation Profile:  Recent Labs Lab 09/13/16 1208 09/15/16 0752 09/16/16 0458 09/17/16 1233 09/18/16 0655  INR 2.9 2.04 2.07 1.96 1.81   Cardiac Enzymes:  Recent Labs Lab 09/14/16 2140 09/15/16 0745 09/15/16 1445  TROPONINI 0.03* <0.03 <0.03   BNP (last 3 results)  Recent Labs  05/08/16 1053  PROBNP 1,132*   HbA1C: No results for input(s): HGBA1C in the last 72 hours. CBG:  Recent Labs Lab 09/17/16 1217 09/17/16 1625 09/17/16 2241 09/18/16 0722 09/18/16 0841  GLUCAP 294* 268* 184* 435* 447*   Lipid Profile: No results for input(s): CHOL, HDL, LDLCALC, TRIG, CHOLHDL, LDLDIRECT in the last 72 hours. Thyroid Function Tests: No results for input(s): TSH, T4TOTAL, FREET4, T3FREE, THYROIDAB in the last 72 hours. Anemia Panel: No results for input(s): VITAMINB12, FOLATE, FERRITIN, TIBC, IRON, RETICCTPCT in the last 72 hours. Urine analysis:    Component Value Date/Time   COLORURINE YELLOW 06/08/2016 1647   APPEARANCEUR CLEAR 06/08/2016 1647   LABSPEC 1.006 06/08/2016 1647   PHURINE 8.0 06/08/2016 1647   GLUCOSEU NEGATIVE 06/08/2016 1647   HGBUR NEGATIVE 06/08/2016 1647   BILIRUBINUR NEGATIVE 06/08/2016 1647   KETONESUR NEGATIVE 06/08/2016 1647   PROTEINUR NEGATIVE 06/08/2016 1647   UROBILINOGEN 0.2 01/27/2015 2308   NITRITE NEGATIVE 06/08/2016 1647   LEUKOCYTESUR NEGATIVE 06/08/2016 1647   Sepsis Labs: @LABRCNTIP (procalcitonin:4,lacticidven:4)  ) Recent Results (from the past 240 hour(s))  Culture, blood  (Routine X 2) w Reflex to ID Panel     Status: None (Preliminary result)   Collection Time: 09/14/16  9:48 PM  Result Value Ref Range Status   Specimen Description BLOOD RIGHT ANTECUBITAL  Final   Special Requests   Final    BOTTLES DRAWN AEROBIC AND ANAEROBIC Blood Culture adequate volume   Culture  Setup Time   Final    GRAM POSITIVE COCCI IN CLUSTERS AEROBIC BOTTLE ONLY Organism ID to follow CRITICAL RESULT CALLED TO, READ BACK BY AND VERIFIED WITH: K.DUPREE,RN AT 8469 ON 09/18/16 BY [email or web address]    Culture   Final    NO GROWTH 2 DAYS Performed  at [facility], [address], [place], [place] [address]    Report Status PENDING  Incomplete  Blood Culture ID Panel (Reflexed)     Status: None   Collection Time: 09/14/16  9:48 PM  Result Value Ref Range Status   Enterococcus species NOT DETECTED NOT DETECTED Final   Listeria monocytogenes NOT DETECTED NOT DETECTED Final   Staphylococcus species NOT DETECTED NOT DETECTED Final   Staphylococcus aureus NOT DETECTED NOT DETECTED Final   Streptococcus species NOT DETECTED NOT DETECTED Final   Streptococcus agalactiae NOT DETECTED NOT DETECTED Final   Streptococcus pneumoniae NOT DETECTED NOT DETECTED Final   Streptococcus pyogenes NOT DETECTED NOT DETECTED Final   Acinetobacter baumannii NOT DETECTED NOT DETECTED Final   Enterobacteriaceae species NOT DETECTED NOT DETECTED Final   Enterobacter cloacae complex NOT DETECTED NOT DETECTED Final   Escherichia coli NOT DETECTED NOT DETECTED Final   Klebsiella oxytoca NOT DETECTED NOT DETECTED Final   Klebsiella pneumoniae NOT DETECTED NOT DETECTED Final   Proteus species NOT DETECTED NOT DETECTED Final   Serratia marcescens NOT DETECTED NOT DETECTED Final   Haemophilus influenzae NOT DETECTED NOT DETECTED Final   Neisseria meningitidis NOT DETECTED NOT DETECTED Final   Pseudomonas aeruginosa NOT DETECTED NOT DETECTED Final   Candida albicans NOT DETECTED NOT DETECTED Final   Candida  glabrata NOT DETECTED NOT DETECTED Final   Candida krusei NOT DETECTED NOT DETECTED Final   Candida parapsilosis NOT DETECTED NOT DETECTED Final   Candida tropicalis NOT DETECTED NOT DETECTED Final    Comment: Performed at [facility], [address], [place], [place] [address]  Culture, blood (Routine X 2) w Reflex to ID Panel     Status: None (Preliminary result)   Collection Time: 09/14/16 11:25 PM  Result Value Ref Range Status   Specimen Description BLOOD BLOOD LEFT HAND  Final   Special Requests   Final    BOTTLES DRAWN AEROBIC AND ANAEROBIC Blood Culture adequate volume   Culture   Final    NO GROWTH 2 DAYS Performed at [facility], [address] [place], [place] [address]    Report Status PENDING  Incomplete  MRSA PCR Screening     Status: None   Collection Time: 09/15/16  5:16 PM  Result Value Ref Range Status   MRSA by PCR NEGATIVE NEGATIVE Final    Comment:        The GeneXpert MRSA Assay (FDA approved for NASAL specimens only), is one component of a comprehensive MRSA colonization surveillance program. It is not intended to diagnose MRSA infection nor to guide or monitor treatment for MRSA infections.          Radiology Studies: [place] Chest  Result Date: 09/17/2016 CLINICAL DATA:  Acute shortness of breath. Pleural effusion by chest x-ray. Request evaluation for possible thoracentesis. EXAM: CHEST ULTRASOUND COMPARISON:  None. FINDINGS: Limited ultrasound of the left chest finds very small partially loculated effusion at the left lung base. There is also consolidated lung. These findings are not consistent with what was suggested on chest x-ray. IMPRESSION: Very small, partially loculated left pleural effusion. Insufficient for thoracentesis. Read by: Brayton El PA-C Electronically Signed   By: [name]   On: 09/17/2016 14:26   Dg Chest Port 1 View  Result Date: 09/17/2016 CLINICAL DATA:  Respiratory failure EXAM: PORTABLE CHEST 1 VIEW  COMPARISON:  September 14, 2016 FINDINGS: No pneumothorax. The right lung is clear. Increasing effusion underlying opacity  on the left. Shift of the heart and mediastinum to the left may represent some underlying volume loss is well. No other interval changes. IMPRESSION: Increasing effusion and underlying opacity on the left with possible associated volume loss as above. Recommend follow-up to resolution. Electronically Signed   By: Gerome Sam III M.D   On: 09/17/2016 11:20        Scheduled Meds: . atorvastatin  10 mg Oral q1800  . azithromycin  500 mg Oral Daily  . calcium-vitamin D  1 tablet Oral Q breakfast  . carvedilol  6.25 mg Oral BID  . chlorhexidine  15 mL Mouth/Throat BID  . cycloSPORINE  1 drop Both Eyes BID  . enoxaparin (LOVENOX) injection  1 mg/kg Subcutaneous Q24H  . feeding supplement (NEPRO CARB STEADY)  237 mL Oral BID BM  . feeding supplement (PRO-STAT SUGAR FREE 64)  30 mL Oral BID  . fluticasone  1 spray Each Nare BID  . free water  230 mL Per Tube BID  . furosemide  20 mg Oral Daily  . gabapentin  600 mg Oral TID  . insulin aspart  0-20 Units Subcutaneous TID WC  . ipratropium-albuterol  3 mL Nebulization TID  . isosorbide mononitrate  30 mg Oral Daily  . latanoprost  1 drop Both Eyes QHS  . levothyroxine  137 mcg Oral QAC breakfast  . methylPREDNISolone (SOLU-MEDROL) injection  60 mg Intravenous Q6H  . multivitamin with minerals  1 tablet Oral Daily  . nystatin   Topical TID  . polyvinyl alcohol  1 drop Both Eyes QID  . sertraline  100 mg Oral Daily  . Warfarin - Pharmacist Dosing Inpatient   Does not apply q1800   Continuous Infusions: . sodium chloride 10 mL/hr (09/15/16 2048)  . feeding supplement (OSMOLITE 1.5 CAL) 1,000 mL (09/18/16 0330)     LOS: 3 days    Time spent: 44    Pleas Koch, MD [facility]   If 7PM-7AM, please contact night-coverage [email or web address] Password [email or web address] 09/18/2016, 8:45 AM

## 2016-09-19 ENCOUNTER — Ambulatory Visit: Payer: Medicare Other | Admitting: Neurology

## 2016-09-19 ENCOUNTER — Ambulatory Visit: Payer: Medicare Other | Admitting: Internal Medicine

## 2016-09-19 LAB — BASIC METABOLIC PANEL
Anion gap: 6 (ref 5–15)
Anion gap: 8 (ref 5–15)
BUN: 53 mg/dL — ABNORMAL HIGH (ref 6–20)
BUN: 50 mg/dL — ABNORMAL HIGH (ref 6–20)
CO2: 28 mmol/L (ref 22–32)
CO2: 25 mmol/L (ref 22–32)
Calcium: 9 mg/dL (ref 8.9–10.3)
Calcium: 8.9 mg/dL (ref 8.9–10.3)
Chloride: 102 mmol/L (ref 101–111)
Chloride: 103 mmol/L (ref 101–111)
Creatinine, Ser: 1.12 mg/dL — ABNORMAL HIGH (ref 0.44–1.00)
Creatinine, Ser: 1.06 mg/dL — ABNORMAL HIGH (ref 0.44–1.00)
GFR calc Af Amer: 48 mL/min — ABNORMAL LOW (ref >60)
GFR calc Af Amer: 52 mL/min — ABNORMAL LOW (ref >60)
GFR calc non Af Amer: 42 mL/min — ABNORMAL LOW (ref >60)
GFR calc non Af Amer: 44 mL/min — ABNORMAL LOW (ref >60)
Glucose, Bld: 387 mg/dL — ABNORMAL HIGH (ref 65–99)
Glucose, Bld: 415 mg/dL — ABNORMAL HIGH (ref 65–99)
Potassium: 4 mmol/L (ref 3.5–5.1)
Potassium: 4 mmol/L (ref 3.5–5.1)
Sodium: 136 mmol/L (ref 135–145)
Sodium: 136 mmol/L (ref 135–145)

## 2016-09-19 LAB — GLUCOSE, CAPILLARY
Glucose-Capillary: 272 mg/dL — ABNORMAL HIGH (ref 65–99)
Glucose-Capillary: 308 mg/dL — ABNORMAL HIGH (ref 65–99)
Glucose-Capillary: 392 mg/dL — ABNORMAL HIGH (ref 65–99)
Glucose-Capillary: 398 mg/dL — ABNORMAL HIGH (ref 65–99)
Glucose-Capillary: 333 mg/dL — ABNORMAL HIGH (ref 65–99)
Glucose-Capillary: 210 mg/dL — ABNORMAL HIGH (ref 65–99)

## 2016-09-19 LAB — CBC WITH DIFFERENTIAL/PLATELET
Basophils Absolute: 0 10*3/uL (ref 0.0–0.1)
Basophils Absolute: 0 10*3/uL (ref 0.0–0.1)
Basophils Relative: 0 %
Basophils Relative: 0 %
Eosinophils Absolute: 0 10*3/uL (ref 0.0–0.7)
Eosinophils Absolute: 0 10*3/uL (ref 0.0–0.7)
Eosinophils Relative: 0 %
Eosinophils Relative: 0 %
HCT: 31.2 % — ABNORMAL LOW (ref 36.0–46.0)
HCT: 30.4 % — ABNORMAL LOW (ref 36.0–46.0)
Hemoglobin: 10.1 g/dL — ABNORMAL LOW (ref 12.0–15.0)
Hemoglobin: 9.8 g/dL — ABNORMAL LOW (ref 12.0–15.0)
Lymphocytes Relative: 11 %
Lymphocytes Relative: 7 %
Lymphs Abs: 2.2 10*3/uL (ref 0.7–4.0)
Lymphs Abs: 1.3 10*3/uL (ref 0.7–4.0)
MCH: 28.9 pg (ref 26.0–34.0)
MCH: 28.5 pg (ref 26.0–34.0)
MCHC: 32.4 g/dL (ref 30.0–36.0)
MCHC: 32.2 g/dL (ref 30.0–36.0)
MCV: 89.4 fL (ref 78.0–100.0)
MCV: 88.4 fL (ref 78.0–100.0)
Monocytes Absolute: 1.6 10*3/uL — ABNORMAL HIGH (ref 0.1–1.0)
Monocytes Absolute: 0.9 10*3/uL (ref 0.1–1.0)
Monocytes Relative: 8 %
Monocytes Relative: 5 %
Neutro Abs: 16.1 10*3/uL — ABNORMAL HIGH (ref 1.7–7.7)
Neutro Abs: 15.9 10*3/uL — ABNORMAL HIGH (ref 1.7–7.7)
Neutrophils Relative %: 81 %
Neutrophils Relative %: 88 %
Platelets: 378 10*3/uL (ref 150–400)
Platelets: 361 10*3/uL (ref 150–400)
RBC: 3.49 MIL/uL — ABNORMAL LOW (ref 3.87–5.11)
RBC: 3.44 MIL/uL — ABNORMAL LOW (ref 3.87–5.11)
RDW: 17.7 % — ABNORMAL HIGH (ref 11.5–15.5)
RDW: 17.6 % — ABNORMAL HIGH (ref 11.5–15.5)
WBC: 20 10*3/uL — ABNORMAL HIGH (ref 4.0–10.5)
WBC: 18.1 10*3/uL — ABNORMAL HIGH (ref 4.0–10.5)

## 2016-09-19 LAB — COMPREHENSIVE METABOLIC PANEL
ALT: 16 U/L (ref 14–54)
AST: 20 U/L (ref 15–41)
Albumin: 2.5 g/dL — ABNORMAL LOW (ref 3.5–5.0)
Alkaline Phosphatase: 62 U/L (ref 38–126)
Anion gap: 8 (ref 5–15)
BUN: 54 mg/dL — ABNORMAL HIGH (ref 6–20)
CO2: 26 mmol/L (ref 22–32)
Calcium: 9 mg/dL (ref 8.9–10.3)
Chloride: 103 mmol/L (ref 101–111)
Creatinine, Ser: 1.14 mg/dL — ABNORMAL HIGH (ref 0.44–1.00)
GFR calc Af Amer: 47 mL/min — ABNORMAL LOW (ref >60)
GFR calc non Af Amer: 41 mL/min — ABNORMAL LOW (ref >60)
Glucose, Bld: 341 mg/dL — ABNORMAL HIGH (ref 65–99)
Potassium: 4 mmol/L (ref 3.5–5.1)
Sodium: 137 mmol/L (ref 135–145)
Total Bilirubin: 0.3 mg/dL (ref 0.3–1.2)
Total Protein: 6.4 g/dL — ABNORMAL LOW (ref 6.5–8.1)

## 2016-09-19 LAB — PROTIME-INR
INR: 1.83
Prothrombin Time: 21.4 seconds — ABNORMAL HIGH (ref 11.4–15.2)

## 2016-09-19 LAB — CULTURE, BLOOD (ROUTINE X 2): Special Requests: ADEQUATE

## 2016-09-19 LAB — TROPONIN I: Troponin I: 0.03 ng/mL (ref <0.03)

## 2016-09-19 MED ORDER — PREDNISONE 20 MG PO TABS
20.0000 mg | ORAL_TABLET | Freq: Every day | ORAL | Status: DC
  Administered 2016-09-20: 20 mg via ORAL
  Filled 2016-09-19: qty 1

## 2016-09-19 MED ORDER — INSULIN DETEMIR 100 UNIT/ML ~~LOC~~ SOLN
10.0000 [IU] | Freq: Two times a day (BID) | SUBCUTANEOUS | Status: DC
  Administered 2016-09-19 (×2): 10 [IU] via SUBCUTANEOUS
  Filled 2016-09-19 (×3): qty 0.1

## 2016-09-19 MED ORDER — INSULIN ASPART 100 UNIT/ML ~~LOC~~ SOLN
3.0000 [IU] | Freq: Three times a day (TID) | SUBCUTANEOUS | Status: DC
  Administered 2016-09-19 – 2016-09-20 (×5): 3 [IU] via SUBCUTANEOUS

## 2016-09-19 MED ORDER — INSULIN ASPART 100 UNIT/ML ~~LOC~~ SOLN
0.0000 [IU] | Freq: Three times a day (TID) | SUBCUTANEOUS | Status: DC
  Administered 2016-09-19 (×2): 9 [IU] via SUBCUTANEOUS
  Administered 2016-09-19 – 2016-09-20 (×2): 7 [IU] via SUBCUTANEOUS
  Administered 2016-09-20: 9 [IU] via SUBCUTANEOUS
  Administered 2016-09-20: 5 [IU] via SUBCUTANEOUS

## 2016-09-19 NOTE — Care Management Note (Addendum)
Case Management Note  Patient Details  Name: Shannon [name] MRN: [id number] Date of Birth: April 05, 1923  Subjective/Objective:      Pt admitted with SOB and cough            Action/Plan:   PTA from home with [facility] status. (agency aware of admit).  Pt is also active with [facility] for palliative services - agency contacted by CM - voicemail left.  Pt is also being followed by vascular for possible amputation.  CM will continue to follow for discharge needs.     Expected Discharge Date:                  Expected Discharge Plan:     In-House Referral:  Clinical Social Work  Discharge planning Services  CM Consult  Post Acute Care Choice:    Choice offered to:     DME Arranged:    DME Agency:     HH Arranged:    HH Agency:     Status of Service:     If discussed at [employer], dates discussed:    Additional Comments:  Maryclare Labrador, RN 09/19/2016, 10:51 AM

## 2016-09-19 NOTE — Progress Notes (Signed)
CRITICAL VALUE ALERT  Critical Value:  Troponin 0.03  Date & Time Notied:  09/19/16  1211  Provider Notified: Verlon Au  Orders Received/Actions taken: waiting response

## 2016-09-19 NOTE — Progress Notes (Signed)
Physical Therapy Evaluation  Assessment:  Pt admitted with above diagnosis. Pt currently with functional limitations due to the deficits listed below (see PT Problem List). At the time of PT eval pt was able to perform transfers with up to +2 assist for balance support and safety. Pt reports she is waiting for amputation surgery and hopes to return home after with assist from daughter/granddaughter for ADL's. At this time feel HHPT is an appropriate d/c disposition, however will need to reevaluate s/p surgery if/when it happens to update d/c recommendations. Pt will benefit from skilled PT to increase their independence and safety with mobility to allow discharge to the venue listed below.      09/19/16 1409  PT Visit Information  Last PT Received On 09/19/16  Assistance Needed +2 (ambulation)  PT/OT/SLP Co-Evaluation/Treatment Yes  Reason for Co-Treatment Complexity of the patient's impairments (multi-system involvement);To address functional/ADL transfers;For patient/therapist safety  PT goals addressed during session Mobility/safety with mobility;Balance;Proper use of DME  History of Present Illness Pt is a 83 y/o female who presents with SOB and coughing. She was admitted with COPD exacerbation. PMH significant for DMII, CKDIII, CHF, gangrene of toe on L foot. Per chart review this is her 8th recent admit. There is a tentative plan for amputation (L forefoot vs BKA) when labs are within limits for surgery.   Precautions  Precautions Fall  Precaution Comments gangrene on toe of L foot.  Restrictions  Weight Bearing Restrictions No  Home Living  Family/patient expects to be discharged to: Private residence  Living Arrangements Children  Available Help at Discharge Family;Available 24 hours/day  Type of [place] One level  Bathroom Shower/Tub Tub/shower unit  Corporate treasurer Yes  [place] - 4  wheels;Cane - single point;Hand held Tourist information centre manager - 2 wheels;BSC;Tub bench  Prior Function  Level of Independence Needs assistance  Gait / Transfers Assistance Needed Pt reports using the walker all the time.   ADL's / Homemaking Assistance Needed Pt reports daughter or granddaughter assist with ADL's. She does not do house chores.   Communication  Communication No difficulties  Pain Assessment  Pain Assessment Faces  Faces Pain Scale 2  Pain Location General grimacing with mobility  Pain Descriptors / Indicators Grimacing;Guarding  Cognition  Arousal/Alertness Awake/alert  Behavior During Therapy WFL for tasks assessed/performed  Overall Cognitive Status No family/caregiver present to determine baseline cognitive functioning  General Comments Likely baseline  Upper Extremity Assessment  Upper Extremity Assessment Defer to OT evaluation  Lower Extremity Assessment  Lower Extremity Assessment Generalized weakness;LLE deficits/detail  LLE Deficits / Details Gangrene present. Noted mild weakness compared to the R side grossly.   Cervical / Trunk Assessment  Cervical / Trunk Assessment Kyphotic  Bed Mobility  Overal bed mobility Needs Assistance  Bed Mobility Supine to Sit  Supine to sit Min assist;+2 for safety/equipment  General bed mobility comments Assist for trunk elevation to full sitting position as well as for management of lines.   Transfers  Overall transfer level Needs assistance  Equipment used Rolling walker (2 wheeled)  Transfers Sit to/from Stand  Sit to Stand Min assist  General transfer comment Balance support as pt powered-up to full stand. VC's for hand placement on seated surface for safety. pt stood from EOB as well as from [facility].   Ambulation/Gait  Ambulation/Gait assistance Min assist  Ambulation Distance (Feet) 5 Feet  Assistive device None  Gait Pattern/deviations  Step-through pattern;Decreased stride length;Trunk flexed  General Gait Details Pt was  able to take a few steps from [facility] to recliner chair. HHA provided.   Gait velocity Decreased  Gait velocity interpretation Below normal speed for age/gender  Balance  Overall balance assessment Needs assistance  Sitting-balance support Feet supported;No upper extremity supported  Sitting balance-Leahy Scale Fair  Standing balance support Bilateral upper extremity supported;During functional activity  Standing balance-Leahy Scale Poor  Standing balance comment Requires UE support for dynamic standing activity.   PT - End of Session  Equipment Utilized During Treatment Gait belt;Oxygen  Activity Tolerance Patient tolerated treatment well  Patient left in chair;with call bell/phone within reach  Nurse Communication Mobility status;Other (comment) (NT)  PT Assessment  PT Recommendation/Assessment Patient needs continued PT services  PT Visit Diagnosis Unsteadiness on feet (R26.81);Pain;Muscle weakness (generalized) (M62.81)  PT Problem List Decreased strength;Decreased range of motion;Decreased activity tolerance;Decreased balance;Decreased mobility;Decreased knowledge of use of DME;Decreased safety awareness;Decreased knowledge of precautions;Pain  PT Plan  PT Frequency (ACUTE ONLY) Min 3X/week  PT Treatment/Interventions (ACUTE ONLY) DME instruction;Gait training;Stair training;Functional mobility training;Therapeutic activities;Therapeutic exercise;Neuromuscular re-education;Patient/family education  AM-PAC PT "6 Clicks" Daily Activity Outcome Measure  Difficulty turning over in bed (including adjusting bedclothes, sheets and blankets)? 1  Difficulty moving from lying on back to sitting on the side of the bed?  1  Difficulty sitting down on and standing up from a chair with arms (e.g., wheelchair, bedside commode, etc,.)? 1  Help needed moving to and from a bed to chair (including a wheelchair)? 3  Help needed walking in hospital room? 3  Help needed climbing 3-5 steps with a railing?  2   6 Click Score 11  Mobility G Code  CL  PT Recommendation  Follow Up Recommendations Home health PT;Supervision/Assistance - 24 hour  PT equipment None recommended by PT  Individuals Consulted  Consulted and Agree with Results and Recommendations Patient  Acute Rehab PT Goals  Patient Stated Goal Return home after surgery  PT Goal Formulation With patient  Time For Goal Achievement 10/03/16  Potential to Achieve Goals Good  PT Time Calculation  PT Start Time (ACUTE ONLY) 1336  PT Stop Time (ACUTE ONLY) 1407  PT Time Calculation (min) (ACUTE ONLY) 31 min  PT General Charges  $$ ACUTE PT VISIT 1 Procedure  PT Evaluation  $PT Eval Moderate Complexity 1 Procedure  Written Expression  Dominant Hand Right   Rolinda Roan, PT, DPT Acute Rehabilitation Services Pager: [phone]

## 2016-09-19 NOTE — Progress Notes (Signed)
PROGRESS NOTE    Shannon [name]  ZOX:[id number] DOB: Sep 17, 1923 DOA: 09/14/2016 PCP: Kirt Boys, DO  Outpatient Specialists:     Brief Narrative:   58 fem CKD III Afib CHad2vasc2 = 6 + Tachybradysyndrome COPD DVT in past CVA Chr L Cerebellear infracts HTN ty II DM GO Obstrution s/p G tube HLD GERD Diastolic HF hypothryoid Pressure ulcer  This is her 8th recent admit She has been offered pallaitive care in the past as her condition is the sum of her mutiple medical issues issues  prior unwilling to undergo amoputation and wishes to try medication only but now has recanted and is willing for surgery Anxiety has affected her care   Assessment & Plan:   Principal Problem:   COPD exacerbation (HCC) Active Problems:   Diabetes mellitus type 2 in nonobese (HCC)   Chronic kidney insufficiency, stage 3 (moderate)   Accelerated hypertension   Hyponatremia   Chronic diastolic CHF (congestive heart failure) (HCC)   Atherosclerosis of native arteries of the extremities with gangrene (HCC)   Gangrene of toe of left foot (HCC)  Respiratory Acute hypoxemic respiratory failure--not on Home O2 COPD exacerbation Left-sided pleural effusion-tiny and non-amenable to [facility] 6/17  ABG 6/17 on venturi mask 50% shows alkalosis--?anxious--add roxanol 5 q 4 prn SOB  wean steroids IV to prednisone 40 and tapering  Still ? WOB-Sats 94%  albuterol 2.5 every 2 when necessary, Flonase 50 g twice a day-added Dulera 6/18--improved Cont Azithromycin till 6/18 then stop  Pleuritic CP 6/19  EKG: normal EKG, normal sinus rhythm, normal sinus rhythm, qrs AXIS 0, PR 0.08  Await troponin.  L foot gangrene Prior DVT Leukocytosis without fever likely de margination from IV solu-medrol initially used  Await hemodyn stability and then consider Angio--appreciate Dr. Arbie Cookey delineation plan  INR 1.8 -defer to Vasc if ok for procedure  Lovenox therapeutic dosing for now  Agib CHad2 Vasc2  6 Tachybrady Diastolic CHF EF 60-65% CAD  Cont Coreg 6.25, IMdur 30 xl  Cont lasix 20 daily  Lovenox    GO Obst 09/2015 GERd  Cont Tube feeds in pm  Can take PO liquids--cont free water 230 ml bid  Cont MirLAX  Dm tY ii C/B NEPHRO/NEUROPATHY Mild DKA resolved quickly 6/18-6/29 and insulin GTT d/c 6/19 am  Keep on SDU today--add back SSI [no long acting at home].  Add levemir 12 U bid  Expect hyperglycemia will improve when off steroid  Has ischemic toe.  Need Angiogram but not hemodyn stable as yet  No need abx currently--TOe visualized by me 6/18 and demarcating gangrene  Cont gabapentin 600 tid--monitor bmet  CKD stg III   basleine GFR anywhere 25-40  Cont lasix for now   CVA c Chr Infarcts       SCD and coumadin Partial code Inpatient pedning resolution dispo hard--might need pallaitive re-discussions again.  Unclear when d/c   Consultants:   vasc  Procedures:   None   Antimicrobials:   Cefepime and flagyll 6/14-->6/17  azithro 6/14-->6/18    Subjective:  Coughing some this am C/o some CP with cough in breast and "pressure" Expectorating sputum -yellow No fever Not Eating and drinking   Objective: Vitals:   09/18/16 2027 09/18/16 2300 09/19/16 0300 09/19/16 0731  BP:  (!) 152/65 (!) 180/69 (!) 193/94  Pulse:  81 87 (!) 37  Resp:  (!) 21 (!) 22 20  Temp:  98.1 F (36.7 C) 98 F (36.7 C) 97.8 F (36.6 C)  TempSrc:  Oral Oral Oral  SpO2: 95% 91% 95% 93%  Weight:      Height:        Intake/Output Summary (Last 24 hours) at 09/19/16 0800 Last data filed at 09/19/16 0731  Gross per 24 hour  Intake          3068.58 ml  Output              300 ml  Net          2768.58 ml   Filed Weights   09/15/16 0212  Weight: 63.6 kg (140 lb 3.4 oz)    Examination:  eomi ncat, no JVD No pallor no ict coarse BS, no wheeze s1 s 2no m/r/g abd slight distended, no yeast-post-op changes, PEG in place L 2nd toe not examined today Neuro  intact    Data Reviewed: I have personally reviewed following labs and imaging studies  CBC:  Recent Labs Lab 09/14/16 2140 09/16/16 0458 09/17/16 1233 09/18/16 0655 09/19/16 0259  WBC 10.6* 13.0* 13.0* 12.9* 20.0*  NEUTROABS 9.3*  --   --   --  16.1*  HGB 10.6* 9.8* 10.9* 10.5* 10.1*  HCT 31.0* 29.7* 33.5* 32.9* 31.2*  MCV 85.9 85.8 87.0 89.2 89.4  PLT 342 362 405* 394 378   Basic Metabolic Panel:  Recent Labs Lab 09/18/16 1016 09/18/16 1445 09/18/16 1823 09/18/16 2317 09/19/16 0259  NA 135 136 139 138 137  K 3.9 4.3 3.9 4.0 4.0  CL 102 103 104 104 103  CO2 20* 23 26 26 26   GLUCOSE 422* 357* 137* 159* 341*  BUN 54* 55* 53* 54* 54*  CREATININE 1.28* 1.21* 1.08* 1.12* 1.14*  CALCIUM 9.1 9.0 9.2 9.2 9.0   GFR: Estimated Creatinine Clearance: 30.1 mL/min (A) (by C-G formula based on SCr of 1.14 mg/dL (H)). Liver Function Tests:  Recent Labs Lab 09/19/16 0259  AST 20  ALT 16  ALKPHOS 62  BILITOT 0.3  PROT 6.4*  ALBUMIN 2.5*   No results for input(s): LIPASE, AMYLASE in the last 168 hours. No results for input(s): AMMONIA in the last 168 hours. Coagulation Profile:  Recent Labs Lab 09/15/16 0752 09/16/16 0458 09/17/16 1233 09/18/16 0655 09/19/16 0259  INR 2.04 2.07 1.96 1.81 1.83   Cardiac Enzymes:  Recent Labs Lab 09/14/16 2140 09/15/16 0745 09/15/16 1445  TROPONINI 0.03* <0.03 <0.03   BNP (last 3 results)  Recent Labs  05/08/16 1053  PROBNP 1,132*   HbA1C: No results for input(s): HGBA1C in the last 72 hours. CBG:  Recent Labs Lab 09/18/16 2129 09/18/16 2242 09/18/16 2352 09/19/16 0110 09/19/16 0651  GLUCAP 102* 115* 163* 272* 308*   Lipid Profile: No results for input(s): CHOL, HDL, LDLCALC, TRIG, CHOLHDL, LDLDIRECT in the last 72 hours. Thyroid Function Tests: No results for input(s): TSH, T4TOTAL, FREET4, T3FREE, THYROIDAB in the last 72 hours. Anemia Panel: No results for input(s): VITAMINB12, FOLATE, FERRITIN,  TIBC, IRON, RETICCTPCT in the last 72 hours. Urine analysis:    Component Value Date/Time   COLORURINE YELLOW 06/08/2016 1647   APPEARANCEUR CLEAR 06/08/2016 1647   LABSPEC 1.006 06/08/2016 1647   PHURINE 8.0 06/08/2016 1647   GLUCOSEU NEGATIVE 06/08/2016 1647   HGBUR NEGATIVE 06/08/2016 1647   BILIRUBINUR NEGATIVE 06/08/2016 1647   KETONESUR NEGATIVE 06/08/2016 1647   PROTEINUR NEGATIVE 06/08/2016 1647   UROBILINOGEN 0.2 01/27/2015 2308   NITRITE NEGATIVE 06/08/2016 1647   LEUKOCYTESUR NEGATIVE 06/08/2016 1647   Sepsis Labs: @LABRCNTIP (procalcitonin:4,lacticidven:4)  ) Recent Results (from the  past 240 hour(s))  Culture, blood (Routine X 2) w Reflex to ID Panel     Status: None (Preliminary result)   Collection Time: 09/14/16  9:48 PM  Result Value Ref Range Status   Specimen Description BLOOD RIGHT ANTECUBITAL  Final   Special Requests   Final    BOTTLES DRAWN AEROBIC AND ANAEROBIC Blood Culture adequate volume   Culture  Setup Time   Final    GRAM POSITIVE COCCI IN CLUSTERS AEROBIC BOTTLE ONLY Organism ID to follow CRITICAL RESULT CALLED TO, READ BACK BY AND VERIFIED WITH: K.DUPREE,RN AT 1610 ON 09/18/16 BY [email or web address]    Culture   Final    NO GROWTH 3 DAYS Performed at [facility], [address] [place], [place] [address]    Report Status PENDING  Incomplete  Blood Culture ID Panel (Reflexed)     Status: None   Collection Time: 09/14/16  9:48 PM  Result Value Ref Range Status   Enterococcus species NOT DETECTED NOT DETECTED Final   Listeria monocytogenes NOT DETECTED NOT DETECTED Final   Staphylococcus species NOT DETECTED NOT DETECTED Final   Staphylococcus aureus NOT DETECTED NOT DETECTED Final   Streptococcus species NOT DETECTED NOT DETECTED Final   Streptococcus agalactiae NOT DETECTED NOT DETECTED Final   Streptococcus pneumoniae NOT DETECTED NOT DETECTED Final   Streptococcus pyogenes NOT DETECTED NOT DETECTED Final   Acinetobacter baumannii NOT  DETECTED NOT DETECTED Final   Enterobacteriaceae species NOT DETECTED NOT DETECTED Final   Enterobacter cloacae complex NOT DETECTED NOT DETECTED Final   Escherichia coli NOT DETECTED NOT DETECTED Final   Klebsiella oxytoca NOT DETECTED NOT DETECTED Final   Klebsiella pneumoniae NOT DETECTED NOT DETECTED Final   Proteus species NOT DETECTED NOT DETECTED Final   Serratia marcescens NOT DETECTED NOT DETECTED Final   Haemophilus influenzae NOT DETECTED NOT DETECTED Final   Neisseria meningitidis NOT DETECTED NOT DETECTED Final   Pseudomonas aeruginosa NOT DETECTED NOT DETECTED Final   Candida albicans NOT DETECTED NOT DETECTED Final   Candida glabrata NOT DETECTED NOT DETECTED Final   Candida krusei NOT DETECTED NOT DETECTED Final   Candida parapsilosis NOT DETECTED NOT DETECTED Final   Candida tropicalis NOT DETECTED NOT DETECTED Final    Comment: Performed at [facility], [address] [place], [place] [address]  Culture, blood (Routine X 2) w Reflex to ID Panel     Status: None (Preliminary result)   Collection Time: 09/14/16 11:25 PM  Result Value Ref Range Status   Specimen Description BLOOD BLOOD LEFT HAND  Final   Special Requests   Final    BOTTLES DRAWN AEROBIC AND ANAEROBIC Blood Culture adequate volume   Culture   Final    NO GROWTH 3 DAYS Performed at [facility], [address] [place], [place] [address]    Report Status PENDING  Incomplete  MRSA PCR Screening     Status: None   Collection Time: 09/15/16  5:16 PM  Result Value Ref Range Status   MRSA by PCR NEGATIVE NEGATIVE Final    Comment:        The GeneXpert MRSA Assay (FDA approved for NASAL specimens only), is one component of a comprehensive MRSA colonization surveillance program. It is not intended to diagnose MRSA infection nor to guide or monitor treatment for MRSA infections.      Radiology Studies: [place] Chest  Result Date: 09/17/2016 CLINICAL DATA:  Acute shortness of breath.  Pleural effusion by  chest x-ray. Request evaluation for possible thoracentesis. EXAM: CHEST ULTRASOUND COMPARISON:  None. FINDINGS: Limited ultrasound of the left chest finds very small partially loculated effusion at the left lung base. There is also consolidated lung. These findings are not consistent with what was suggested on chest x-ray. IMPRESSION: Very small, partially loculated left pleural effusion. Insufficient for thoracentesis. Read by: Brayton El PA-C Electronically Signed   By: [name]   On: 09/17/2016 14:26   Dg Chest Port 1 View  Result Date: 09/17/2016 CLINICAL DATA:  Respiratory failure EXAM: PORTABLE CHEST 1 VIEW COMPARISON:  September 14, 2016 FINDINGS: No pneumothorax. The right lung is clear. Increasing effusion underlying opacity on the left. Shift of the heart and mediastinum to the left may represent some underlying volume loss is well. No other interval changes. IMPRESSION: Increasing effusion and underlying opacity on the left with possible associated volume loss as above. Recommend follow-up to resolution. Electronically Signed   By: Gerome Sam III M.D   On: 09/17/2016 11:20     Scheduled Meds: . atorvastatin  10 mg Oral q1800  . calcium-vitamin D  1 tablet Oral Q breakfast  . carvedilol  6.25 mg Oral BID  . chlorhexidine  15 mL Mouth/Throat BID  . cycloSPORINE  1 drop Both Eyes BID  . enoxaparin (LOVENOX) injection  1 mg/kg Subcutaneous Q24H  . feeding supplement (NEPRO CARB STEADY)  237 mL Oral BID BM  . feeding supplement (PRO-STAT SUGAR FREE 64)  30 mL Oral BID  . fluticasone  1 spray Each Nare BID  . free water  230 mL Per Tube BID  . gabapentin  600 mg Oral TID  . insulin aspart  0-9 Units Subcutaneous TID WC  . insulin aspart  3 Units Subcutaneous TID WC  . insulin detemir  10 Units Subcutaneous BID  . ipratropium-albuterol  3 mL Nebulization TID  . isosorbide mononitrate  30 mg Oral Daily  . latanoprost  1 drop Both Eyes QHS  . levothyroxine   137 mcg Oral QAC breakfast  . mometasone-formoterol  2 puff Inhalation BID  . multivitamin with minerals  1 tablet Oral Daily  . nystatin   Topical TID  . polyvinyl alcohol  1 drop Both Eyes QID  . [START ON 09/20/2016] predniSONE  20 mg Oral QAC breakfast  . sertraline  100 mg Oral Daily   Continuous Infusions: . feeding supplement (OSMOLITE 1.5 CAL) 1,000 mL (09/18/16 2211)     LOS: 4 days    Time spent: 57    Pleas Koch, MD [facility] (P) [phone]   If 7PM-7AM, please contact night-coverage [email or web address] Password [email or web address] 09/19/2016, 8:00 AM

## 2016-09-19 NOTE — Evaluation (Signed)
Occupational Therapy Evaluation Patient Details Name: Shannon [name] MRN: [id number] DOB: June 18, 1923 Today's Date: 09/19/2016    History of Present Illness Pt is a 83 y.o. female who presented via EMS after several days of worsening cough and shortness of breath on 09/14/2016. She was seen several days prior in the ED. She was noted in the ED to have gangrene of 2nd toe of L foot with purulent and foul smeeling discharge. She was [facility] from [facility] to [facility] on 09/15/2016. Pt being followed by vascular for possible amputation. She has a PMH significant for anemia, arthritis, asthma, cradycardia, CAD< chronic diastolic CHF, CKD, complication of blood transfusion, COPD, depression, DVT, gastric stenosis, GERD, history of blood transfusion, hyperlipidemia, hypertension, hypothyroidism, paraesophageal hernia, perforated stroke, type II diabetes mellitus, and ventral hernia with bowel obstruction.    Clinical Impression   PTA, pt reports that her daughter and granddaughter were assisting with showering and tub transfers. She reports independence with dressing tasks and utilizing RW for functional mobility PTA. Pt currently requires max assist for toileting hygiene, min assist +2 for toilet transfers, and mod assist for LB ADL. She presents with generalized weakness, decreased activity tolerance for ADL, and L foot pain limiting her ability to participate in ADL at [facility]. She would benefit from continued OT services while admitted in preparation for D/C home with assistance from family and home health OT services for OT follow-up. Note potential surgery this week and will update D/C recommendations as necessary. OT will continue to follow while admitted.      Follow Up Recommendations  Home health OT;Supervision/Assistance - 24 hour    Equipment Recommendations  None recommended by OT    Recommendations for Other Services       Precautions / Restrictions Precautions Precautions:  Fall Precaution Comments: gangrene on toe of L foot. Restrictions Weight Bearing Restrictions: No      Mobility Bed Mobility Overal bed mobility: Needs Assistance Bed Mobility: Supine to Sit     Supine to sit: Min assist;+2 for safety/equipment     General bed mobility comments: Assist for trunk elevation to full sitting position as well as for management of lines.   Transfers Overall transfer level: Needs assistance Equipment used: 1 person hand held assist Transfers: Sit to/from Stand Sit to Stand: Min assist;+2 safety/equipment         General transfer comment: Balance support as pt powered-up to full stand. VC's for hand placement on seated surface for safety. pt stood from EOB as well as from [facility].     Balance Overall balance assessment: Needs assistance Sitting-balance support: Feet supported;No upper extremity supported Sitting balance-Leahy Scale: Fair     Standing balance support: Bilateral upper extremity supported;During functional activity Standing balance-Leahy Scale: Poor Standing balance comment: Requires UE support for dynamic standing activity.                            ADL either performed or assessed with clinical judgement   ADL Overall ADL's : Needs assistance/impaired Eating/Feeding: Set up;Sitting (thickened liquids)   Grooming: Supervision/safety;Sitting   Upper Body Bathing: Supervision/ safety;Sitting   Lower Body Bathing: Moderate assistance;Sit to/from stand   Upper Body Dressing : Supervision/safety;Sitting   Lower Body Dressing: Moderate assistance;Sit to/from stand   Toilet Transfer: Minimal assistance;+2 for safety/equipment;Stand-pivot   Toileting- Clothing Manipulation and Hygiene: Maximal assistance;Sit to/from stand       Functional mobility during ADLs: Minimal assistance (stand-pivot only)  General ADL Comments: Pt limited by generalized weakness. Able to take a few steps with min +2 assist for safety.       Vision   Vision Assessment?: No apparent visual deficits     Perception     Praxis      Pertinent Vitals/Pain Pain Assessment: Faces Faces Pain Scale: Hurts a little bit Pain Location: General grimacing with mobility Pain Descriptors / Indicators: Grimacing;Guarding     Hand Dominance Right   Extremity/Trunk Assessment Upper Extremity Assessment Upper Extremity Assessment: Generalized weakness   Lower Extremity Assessment Lower Extremity Assessment: Generalized weakness (Gangrene present on L LE) LLE Deficits / Details: Gangrene present. Noted mild weakness compared to the R side grossly.    Cervical / Trunk Assessment Cervical / Trunk Assessment: Kyphotic   Communication Communication Communication: No difficulties   Cognition Arousal/Alertness: Awake/alert Behavior During Therapy: WFL for tasks assessed/performed Overall Cognitive Status: No family/caregiver present to determine baseline cognitive functioning                                 General Comments: Likely baseline   General Comments       Exercises     Shoulder Instructions      Home Living Family/patient expects to be discharged to:: Private residence Living Arrangements: Children Available Help at Discharge: Family;Available 24 hours/day Type of Home: House Home Access: Ramped entrance     Home Layout: One level     Bathroom Shower/Tub: Chief Strategy Officer: Standard Bathroom Accessibility: Yes   Home Equipment: Walker - 4 wheels;Cane - single point;Hand held Careers information officer - 2 wheels;Bedside commode;Tub bench          Prior Functioning/Environment Level of Independence: Needs assistance  Gait / Transfers Assistance Needed: Pt reports using the walker all the time.  ADL's / Homemaking Assistance Needed: Pt reports daughter or granddaughter assist with ADL's. She does not do house chores.             OT Problem List: Decreased  strength;Decreased range of motion;Decreased activity tolerance;Impaired balance (sitting and/or standing);Decreased safety awareness;Decreased knowledge of use of DME or AE;Decreased knowledge of precautions;Decreased cognition;Pain      OT Treatment/Interventions: Self-care/ADL training;Therapeutic exercise;Energy conservation;DME and/or AE instruction;Therapeutic activities;Patient/family education;Balance training;Cognitive remediation/compensation    OT Goals(Current goals can be found in the care plan section) Acute Rehab OT Goals Patient Stated Goal: Return home after surgery OT Goal Formulation: With patient Time For Goal Achievement: 10/03/16 Potential to Achieve Goals: Good ADL Goals Pt Will Perform Grooming: with supervision;standing Pt Will Perform Upper Body Dressing: with modified independence;sitting Pt Will Perform Lower Body Dressing: with supervision;sit to/from stand Pt Will Transfer to Toilet: with supervision;ambulating;bedside commode (BSC over toilet) Pt Will Perform Toileting - Clothing Manipulation and hygiene: with supervision;sit to/from stand Pt Will Perform Tub/Shower Transfer: Tub transfer;with min guard assist;tub bench;ambulating;rolling walker Pt/caregiver will Perform Home Exercise Program: Increased strength;Both right and left upper extremity;With written HEP provided;With Supervision Additional ADL Goal #1: Pt will generalize 3 strategies to conserve energy into daily ADL routine.  OT Frequency: Min 2X/week   Barriers to D/C:            Co-evaluation PT/OT/SLP Co-Evaluation/Treatment: Yes Reason for Co-Treatment: Complexity of the patient's impairments (multi-system involvement);For patient/therapist safety;To address functional/ADL transfers PT goals addressed during session: Mobility/safety with mobility;Balance;Proper use of DME OT goals addressed during session: ADL's and self-care  AM-PAC PT "6 Clicks" Daily Activity     Outcome Measure  Help from another person eating meals?: A Little Help from another person taking care of personal grooming?: A Little Help from another person toileting, which includes using toliet, bedpan, or urinal?: A Lot Help from another person bathing (including washing, rinsing, drying)?: A Lot Help from another person to put on and taking off regular upper body clothing?: A Little Help from another person to put on and taking off regular lower body clothing?: A Lot 6 Click Score: 15   End of Session Equipment Utilized During Treatment: Gait belt Nurse Communication: Mobility status  Activity Tolerance: Patient tolerated treatment well Patient left: in chair;with call bell/phone within reach;with chair alarm set (with PT in room)  OT Visit Diagnosis: Other abnormalities of gait and mobility (R26.89);Pain;Muscle weakness (generalized) (M62.81) Pain - Right/Left: Left Pain - part of body: Ankle and joints of foot (2nd toe)                Time: 2355-7322 OT Time Calculation (min): 28 min Charges:  OT General Charges $OT Visit: 1 Procedure OT Evaluation $OT Eval Moderate Complexity: 1 Procedure G-Codes:     Doristine Section, MS OTR/L  Pager: [phone]   Shakiera Edelson A Jennfer Gassen 09/19/2016, 3:33 PM

## 2016-09-20 LAB — GLUCOSE, CAPILLARY
Glucose-Capillary: 353 mg/dL — ABNORMAL HIGH (ref 65–99)
Glucose-Capillary: 321 mg/dL — ABNORMAL HIGH (ref 65–99)
Glucose-Capillary: 276 mg/dL — ABNORMAL HIGH (ref 65–99)
Glucose-Capillary: 212 mg/dL — ABNORMAL HIGH (ref 65–99)

## 2016-09-20 LAB — CULTURE, BLOOD (ROUTINE X 2)
Culture: NO GROWTH
Special Requests: ADEQUATE

## 2016-09-20 LAB — CBC
HCT: 31.7 % — ABNORMAL LOW (ref 36.0–46.0)
Hemoglobin: 10.5 g/dL — ABNORMAL LOW (ref 12.0–15.0)
MCH: 29.3 pg (ref 26.0–34.0)
MCHC: 33.1 g/dL (ref 30.0–36.0)
MCV: 88.5 fL (ref 78.0–100.0)
Platelets: 375 10*3/uL (ref 150–400)
RBC: 3.58 MIL/uL — ABNORMAL LOW (ref 3.87–5.11)
RDW: 17.6 % — ABNORMAL HIGH (ref 11.5–15.5)
WBC: 16.8 10*3/uL — ABNORMAL HIGH (ref 4.0–10.5)

## 2016-09-20 LAB — PROTIME-INR
INR: 1.52
Prothrombin Time: 18.5 seconds — ABNORMAL HIGH (ref 11.4–15.2)

## 2016-09-20 LAB — COMPREHENSIVE METABOLIC PANEL
ALT: 16 U/L (ref 14–54)
AST: 19 U/L (ref 15–41)
Albumin: 2.4 g/dL — ABNORMAL LOW (ref 3.5–5.0)
Alkaline Phosphatase: 75 U/L (ref 38–126)
Anion gap: 8 (ref 5–15)
BUN: 54 mg/dL — ABNORMAL HIGH (ref 6–20)
CO2: 28 mmol/L (ref 22–32)
Calcium: 9.2 mg/dL (ref 8.9–10.3)
Chloride: 104 mmol/L (ref 101–111)
Creatinine, Ser: 1.05 mg/dL — ABNORMAL HIGH (ref 0.44–1.00)
GFR calc Af Amer: 52 mL/min — ABNORMAL LOW (ref >60)
GFR calc non Af Amer: 45 mL/min — ABNORMAL LOW (ref >60)
Glucose, Bld: 358 mg/dL — ABNORMAL HIGH (ref 65–99)
Potassium: 3.8 mmol/L (ref 3.5–5.1)
Sodium: 140 mmol/L (ref 135–145)
Total Bilirubin: 0.4 mg/dL (ref 0.3–1.2)
Total Protein: 6.5 g/dL (ref 6.5–8.1)

## 2016-09-20 MED ORDER — CARVEDILOL 6.25 MG PO TABS
6.2500 mg | ORAL_TABLET | Freq: Once | ORAL | Status: AC
  Administered 2016-09-20: 6.25 mg via ORAL
  Filled 2016-09-20: qty 1

## 2016-09-20 MED ORDER — HYDRALAZINE HCL 20 MG/ML IJ SOLN
10.0000 mg | Freq: Once | INTRAMUSCULAR | Status: AC
  Administered 2016-09-20: 10 mg via INTRAVENOUS
  Filled 2016-09-20: qty 1

## 2016-09-20 MED ORDER — INSULIN DETEMIR 100 UNIT/ML ~~LOC~~ SOLN
15.0000 [IU] | Freq: Every day | SUBCUTANEOUS | Status: DC
  Administered 2016-09-22: 15 [IU] via SUBCUTANEOUS
  Filled 2016-09-20 (×3): qty 0.15

## 2016-09-20 MED ORDER — CARVEDILOL 12.5 MG PO TABS
12.5000 mg | ORAL_TABLET | Freq: Two times a day (BID) | ORAL | Status: DC
  Administered 2016-09-20 – 2016-09-27 (×14): 12.5 mg via ORAL
  Filled 2016-09-20 (×15): qty 1

## 2016-09-20 MED ORDER — PRO-STAT SUGAR FREE PO LIQD
30.0000 mL | Freq: Two times a day (BID) | ORAL | Status: DC
  Administered 2016-09-20 – 2016-09-27 (×12): 30 mL
  Filled 2016-09-20 (×13): qty 30

## 2016-09-20 MED ORDER — INSULIN DETEMIR 100 UNIT/ML ~~LOC~~ SOLN
15.0000 [IU] | Freq: Two times a day (BID) | SUBCUTANEOUS | Status: DC
  Administered 2016-09-20: 15 [IU] via SUBCUTANEOUS
  Filled 2016-09-20: qty 0.15

## 2016-09-20 NOTE — Progress Notes (Signed)
Transferred to 5W38 by Mel, RN in stable condition.  Telephone report called to Wells Guiles, RN, prior to transfer.

## 2016-09-20 NOTE — Progress Notes (Signed)
PROGRESS NOTE    Shannon [name]  ZOX:[id number] DOB: 26-Apr-1923 DOA: 09/14/2016 PCP: Kirt Boys, DO  Outpatient Specialists:     Brief Narrative:   37 fem CKD III Afib CHad2vasc2 = 6 + Tachybradysyndrome, on coumadin COPD DVT in past CVA Chr L Cerebellear infracts HTN ty II DM GO Obstrution s/p G tube HLD GERD Diastolic HF hypothryoid Pressure ulcer  This is her 8th recent admit She has been offered pallaitive care in the past as her condition is the sum of her mutiple medical issues issues  prior unwilling to undergo amoputation and wishes to try medication only but now has recanted and is willing for surgery Anxiety has affected her care   Assessment & Plan:   Principal Problem:   COPD exacerbation (HCC) Active Problems:   Diabetes mellitus type 2 in nonobese (HCC)   Chronic kidney insufficiency, stage 3 (moderate)   Accelerated hypertension   Hyponatremia   Chronic diastolic CHF (congestive heart failure) (HCC)   Atherosclerosis of native arteries of the extremities with gangrene (HCC)   Gangrene of toe of left foot (HCC)  Respiratory Acute hypoxemic respiratory failure--not on Home O2 COPD exacerbation Left-sided pleural effusion-tiny and non-amenable to [facility] 6/17  ABG 6/17 on venturi mask 50% shows alkalosis--?anxious--add roxanol 5 q 4 prn SOB  weaned steroids IV to prednisone 40mg  6/18 and tapered off 6/20  Now on 3 Liters Oxygen and improved-Sats above 90.    Rn aware Re: Incentive spirometry, ambulate for desat screen   Get 2 vw CXR 6/21  albuterol 2.5 every 2 when necessary, Flonase 50 g twice a day-added Dulera 6/18--improved Cont Azithromycin till 6/18 then stop  Pleuritic CP 6/19  EKG: normal EKG, normal sinus rhythm, normal sinus rhythm, qrs AXIS 0, PR 0.08  Troponin greay zone and reassuring  L foot gangrene Prior DVT Leukocytosis without fever likely de margination from IV solu-medrol initially used Has ischemic toe.    No need abx  currently--TOe visualized by me 6/18, 6/20 and demarcating gangrene  Angio per Dr. Arbie Cookey delineation plan  INR 1.5 -defer to Vasc if ok for procedure  Lovenox therapeutic dosing for now  Afib CHad2 Vasc2= 6 Tachybrady Mod ctrll HTN Diastolic CHF EF 60-65% CAD  Cont Coreg 6.25-->12.6 on 6/20, IMdur 30 xl  Continue Prn hydralazine 25  Lasix stopped 6/18  Lovenox    GO Obst 09/2015 GERd  Cont Tube feeds in pm  Can take PO liquids--cont free water 230 ml bid  Cont MirLAX  Dm tY ii C/B NEPHRO/NEUROPATHY Mild DKA resolved quickly 6/18-6/29 and insulin GTT d/c 6/19 am  Transfer out SDU -->6e--add back SSI [no long acting at home].  Add levemir 12 U--15 U 6/20 am and reasses trends   Expect hyperglycemia better today off steroid  Cont gabapentin 600 tid--monitor bmet  CKD stg III   basleine GFR anywhere 25-40  Off lasix since 6/18  CVA c Chr Infarcts   hypothryoid  Cont synthroid 137 mcg  TSH in 3 week  Depression, situational  Cont sertraline 100 qd  Global  Asked Hospice/PC [place] to peripherally follow   Patient may change midn re amputation-then will need comfort care   SCD and coumadin Partial code Inpatient pedning resolution dispo hard--might need pallaitive re-discussions again.  Unclear when d/c   Consultants:   vasc  Procedures:   None   Antimicrobials:   Cefepime and flagyll 6/14-->6/17  azithro 6/14-->6/18    Subjective:   Objective: Vitals:   09/20/16 0757  09/20/16 0800 09/20/16 0900 09/20/16 0913  BP:    (!) 165/87  Pulse:  80 (!) 122 (!) 114  Resp:  (!) 27    Temp:      TempSrc:      SpO2: 95% 96% 93% 95%  Weight:      Height:        Intake/Output Summary (Last 24 hours) at 09/20/16 1127 Last data filed at 09/20/16 0600  Gross per 24 hour  Intake             1045 ml  Output              925 ml  Net              120 ml   Filed Weights   09/15/16 0212  Weight: 63.6 kg (140 lb 3.4 oz)    Examination:  eomi ncat, no  JVD No pallor no ict coarse BS, no wheeze s1 s 2no m/r/g abd slight distended, no yeast-post-op changes, PEG in place L 2nd toe not examined today Neuro intact    Data Reviewed: I have personally reviewed following labs and imaging studies  CBC:  Recent Labs Lab 09/14/16 2140  09/17/16 1233 09/18/16 0655 09/19/16 0259 09/19/16 1104 09/20/16 0608  WBC 10.6*  < > 13.0* 12.9* 20.0* 18.1* 16.8*  NEUTROABS 9.3*  --   --   --  16.1* 15.9*  --   HGB 10.6*  < > 10.9* 10.5* 10.1* 9.8* 10.5*  HCT 31.0*  < > 33.5* 32.9* 31.2* 30.4* 31.7*  MCV 85.9  < > 87.0 89.2 89.4 88.4 88.5  PLT 342  < > 405* 394 378 361 375  < > = values in this interval not displayed. Basic Metabolic Panel:  Recent Labs Lab 09/18/16 2317 09/19/16 0259 09/19/16 0715 09/19/16 1104 09/20/16 0608  NA 138 137 136 136 140  K 4.0 4.0 4.0 4.0 3.8  CL 104 103 102 103 104  CO2 26 26 28 25 28   GLUCOSE 159* 341* 387* 415* 358*  BUN 54* 54* 53* 50* 54*  CREATININE 1.12* 1.14* 1.12* 1.06* 1.05*  CALCIUM 9.2 9.0 9.0 8.9 9.2   GFR: Estimated Creatinine Clearance: 32.7 mL/min (A) (by C-G formula based on SCr of 1.05 mg/dL (H)). Liver Function Tests:  Recent Labs Lab 09/19/16 0259 09/20/16 0608  AST 20 19  ALT 16 16  ALKPHOS 62 75  BILITOT 0.3 0.4  PROT 6.4* 6.5  ALBUMIN 2.5* 2.4*   No results for input(s): LIPASE, AMYLASE in the last 168 hours. No results for input(s): AMMONIA in the last 168 hours. Coagulation Profile:  Recent Labs Lab 09/16/16 0458 09/17/16 1233 09/18/16 0655 09/19/16 0259 09/20/16 0608  INR 2.07 1.96 1.81 1.83 1.52   Cardiac Enzymes:  Recent Labs Lab 09/14/16 2140 09/15/16 0745 09/15/16 1445 09/19/16 1104  TROPONINI 0.03* <0.03 <0.03 0.03*   BNP (last 3 results)  Recent Labs  05/08/16 1053  PROBNP 1,132*   HbA1C: No results for input(s): HGBA1C in the last 72 hours. CBG:  Recent Labs Lab 09/19/16 0835 09/19/16 1203 09/19/16 1552 09/19/16 2135  09/20/16 0748  GLUCAP 392* 398* 333* 210* 353*   Lipid Profile: No results for input(s): CHOL, HDL, LDLCALC, TRIG, CHOLHDL, LDLDIRECT in the last 72 hours. Thyroid Function Tests: No results for input(s): TSH, T4TOTAL, FREET4, T3FREE, THYROIDAB in the last 72 hours. Anemia Panel: No results for input(s): VITAMINB12, FOLATE, FERRITIN, TIBC, IRON, RETICCTPCT in the last 72 hours. Urine  analysis:    Component Value Date/Time   COLORURINE YELLOW 06/08/2016 1647   APPEARANCEUR CLEAR 06/08/2016 1647   LABSPEC 1.006 06/08/2016 1647   PHURINE 8.0 06/08/2016 1647   GLUCOSEU NEGATIVE 06/08/2016 1647   HGBUR NEGATIVE 06/08/2016 1647   BILIRUBINUR NEGATIVE 06/08/2016 1647   KETONESUR NEGATIVE 06/08/2016 1647   PROTEINUR NEGATIVE 06/08/2016 1647   UROBILINOGEN 0.2 01/27/2015 2308   NITRITE NEGATIVE 06/08/2016 1647   LEUKOCYTESUR NEGATIVE 06/08/2016 1647   Sepsis Labs: @LABRCNTIP (procalcitonin:4,lacticidven:4)  ) Recent Results (from the past 240 hour(s))  Culture, blood (Routine X 2) w Reflex to ID Panel     Status: Abnormal   Collection Time: 09/14/16  9:48 PM  Result Value Ref Range Status   Specimen Description BLOOD RIGHT ANTECUBITAL  Final   Special Requests   Final    BOTTLES DRAWN AEROBIC AND ANAEROBIC Blood Culture adequate volume   Culture  Setup Time   Final    GRAM POSITIVE COCCI IN CLUSTERS AEROBIC BOTTLE ONLY CRITICAL RESULT CALLED TO, READ BACK BY AND VERIFIED WITH: K.DUPREE,RN AT 4742 ON 09/18/16 BY [email or web address]    Culture (A)  Final    MICROCOCCUS SPECIES Standardized susceptibility testing for this organism is not available. Performed at [facility], [address] [place], [place] [address]    Report Status 09/19/2016 FINAL  Final  Blood Culture ID Panel (Reflexed)     Status: None   Collection Time: 09/14/16  9:48 PM  Result Value Ref Range Status   Enterococcus species NOT DETECTED NOT DETECTED Final   Listeria monocytogenes NOT DETECTED NOT DETECTED  Final   Staphylococcus species NOT DETECTED NOT DETECTED Final   Staphylococcus aureus NOT DETECTED NOT DETECTED Final   Streptococcus species NOT DETECTED NOT DETECTED Final   Streptococcus agalactiae NOT DETECTED NOT DETECTED Final   Streptococcus pneumoniae NOT DETECTED NOT DETECTED Final   Streptococcus pyogenes NOT DETECTED NOT DETECTED Final   Acinetobacter baumannii NOT DETECTED NOT DETECTED Final   Enterobacteriaceae species NOT DETECTED NOT DETECTED Final   Enterobacter cloacae complex NOT DETECTED NOT DETECTED Final   Escherichia coli NOT DETECTED NOT DETECTED Final   Klebsiella oxytoca NOT DETECTED NOT DETECTED Final   Klebsiella pneumoniae NOT DETECTED NOT DETECTED Final   Proteus species NOT DETECTED NOT DETECTED Final   Serratia marcescens NOT DETECTED NOT DETECTED Final   Haemophilus influenzae NOT DETECTED NOT DETECTED Final   Neisseria meningitidis NOT DETECTED NOT DETECTED Final   Pseudomonas aeruginosa NOT DETECTED NOT DETECTED Final   Candida albicans NOT DETECTED NOT DETECTED Final   Candida glabrata NOT DETECTED NOT DETECTED Final   Candida krusei NOT DETECTED NOT DETECTED Final   Candida parapsilosis NOT DETECTED NOT DETECTED Final   Candida tropicalis NOT DETECTED NOT DETECTED Final    Comment: Performed at [facility], [address], [place], [place] [address]  Culture, blood (Routine X 2) w Reflex to ID Panel     Status: None (Preliminary result)   Collection Time: 09/14/16 11:25 PM  Result Value Ref Range Status   Specimen Description BLOOD BLOOD LEFT HAND  Final   Special Requests   Final    BOTTLES DRAWN AEROBIC AND ANAEROBIC Blood Culture adequate volume   Culture   Final    NO GROWTH 4 DAYS Performed at [facility], [address], [place], [place] [address]    Report Status PENDING  Incomplete  MRSA PCR Screening     Status: None  Collection Time: 09/15/16  5:16 PM  Result Value Ref Range Status   MRSA by PCR NEGATIVE NEGATIVE  Final    Comment:        The GeneXpert MRSA Assay (FDA approved for NASAL specimens only), is one component of a comprehensive MRSA colonization surveillance program. It is not intended to diagnose MRSA infection nor to guide or monitor treatment for MRSA infections.      Radiology Studies: No results found.   Scheduled Meds: . atorvastatin  10 mg Oral q1800  . calcium-vitamin D  1 tablet Oral Q breakfast  . carvedilol  6.25 mg Oral BID  . chlorhexidine  15 mL Mouth/Throat BID  . cycloSPORINE  1 drop Both Eyes BID  . enoxaparin (LOVENOX) injection  1 mg/kg Subcutaneous Q24H  . feeding supplement (NEPRO CARB STEADY)  237 mL Oral BID BM  . feeding supplement (PRO-STAT SUGAR FREE 64)  30 mL Oral BID  . fluticasone  1 spray Each Nare BID  . free water  230 mL Per Tube BID  . gabapentin  600 mg Oral TID  . insulin aspart  0-9 Units Subcutaneous TID WC  . insulin aspart  3 Units Subcutaneous TID WC  . insulin detemir  15 Units Subcutaneous BID  . ipratropium-albuterol  3 mL Nebulization TID  . isosorbide mononitrate  30 mg Oral Daily  . latanoprost  1 drop Both Eyes QHS  . levothyroxine  137 mcg Oral QAC breakfast  . mometasone-formoterol  2 puff Inhalation BID  . multivitamin with minerals  1 tablet Oral Daily  . nystatin   Topical TID  . polyvinyl alcohol  1 drop Both Eyes QID  . sertraline  100 mg Oral Daily   Continuous Infusions: . feeding supplement (OSMOLITE 1.5 CAL) Stopped (09/20/16 0900)     LOS: 5 days    Time spent: 76    Pleas Koch, MD [facility] ([facility]   If 7PM-7AM, please contact night-coverage [email or web address] Password [facility] 09/20/2016, 11:27 AM

## 2016-09-20 NOTE — Progress Notes (Signed)
Patient ID: Shannon [name], female   DOB: [birth year], 83 y.o.   MRN: [id number] Much more comfortable from a respiratory status. Plan arteriogram tomorrow to determine vascular status and make recommendations regarding dry gangrene of her second toe. Patient understands plan for procedure

## 2016-09-20 NOTE — Progress Notes (Signed)
Physical Therapy Treatment Patient Details Name: Shannon [name] MRN: [id number] DOB: 1923/08/30 Today's Date: 09/20/2016    History of Present Illness Pt is a 84 y.o. female who presented via EMS after several days of worsening cough and shortness of breath on 09/14/2016. She was seen several days prior in the ED. She was noted in the ED to have gangrene of 2nd toe of L foot with purulent and foul smeeling discharge. She was transferred from [facility] to [facility] on 09/15/2016. Pt being followed by vascular for possible amputation. She has a PMH significant for anemia, arthritis, asthma, cradycardia, CAD< chronic diastolic CHF, CKD, complication of blood transfusion, COPD, depression, DVT, gastric stenosis, GERD, history of blood transfusion, hyperlipidemia, hypertension, hypothyroidism, paraesophageal hernia, perforated stroke, type II diabetes mellitus, and ventral hernia with bowel obstruction.     PT Comments    Pt is progressing with her mobility, still with 2/4 DOE during short distance mobility with RW around her room, however, O2 sats remain stable on 3 L O2 [place].  PT will continue to follow acutely to encourage safe mobility in efforts to get her back home with her family's supervision.   Follow Up Recommendations  Home health PT;Supervision/Assistance - 24 hour     Equipment Recommendations  None recommended by PT    Recommendations for Other Services   NA     Precautions / Restrictions Precautions Precautions: Fall Precaution Comments: gangrene on toe of L foot. Restrictions Weight Bearing Restrictions: No    Mobility  Bed Mobility Overal bed mobility: Needs Assistance Bed Mobility: Supine to Sit     Supine to sit: Min assist     General bed mobility comments: Min hand held assist to help pt pull to sitting EOB.   Transfers Overall transfer level: Needs assistance Equipment used: Rolling walker (2 wheeled) Transfers: Sit to/from [employer] Sit to Stand: Min assist;+2 safety/equipment Stand pivot transfers: Min assist;+2 safety/equipment       General transfer comment: Min assist to support trunk during transitions to power up to stand and for balance during transfer to [facility]  Ambulation/Gait Ambulation/Gait assistance: Min assist;+2 safety/equipment Ambulation Distance (Feet): 5 Feet Assistive device: Rolling walker (2 wheeled) Gait Pattern/deviations: Step-through pattern;Shuffle     General Gait Details: Min assist to support trunk to walk from [facility] to recliner chair.  Second person helpful for line management (O2 and IV/cardiac monitor).  Pt with increased DOE 2/4, however, VSS.              Balance Overall balance assessment: Needs assistance Sitting-balance support: Feet supported;Bilateral upper extremity supported Sitting balance-Leahy Scale: Good     Standing balance support: Bilateral upper extremity supported Standing balance-Leahy Scale: Poor                              Cognition Arousal/Alertness: Awake/alert Behavior During Therapy: WFL for tasks assessed/performed Overall Cognitive Status: Within Functional Limits for tasks assessed                                           General Comments General comments (skin integrity, edema, etc.): Total assist for pericare      Pertinent Vitals/Pain Pain Assessment: Faces Faces Pain Scale: Hurts even more Pain Location: bil feet Pain Descriptors / Indicators: Grimacing;Guarding Pain Intervention(s): Limited activity within patient's  tolerance;Monitored during session;Repositioned           PT Goals (current goals can now be found in the care plan section) Acute Rehab PT Goals Patient Stated Goal: to go home at discharge.  Progress towards PT goals: Progressing toward goals    Frequency    Min 3X/week      PT Plan Current plan remains appropriate       AM-PAC PT "6 Clicks" Daily Activity   Outcome Measure  Difficulty turning over in bed (including adjusting bedclothes, sheets and blankets)?: Total Difficulty moving from lying on back to sitting on the side of the bed? : Total Difficulty sitting down on and standing up from a chair with arms (e.g., wheelchair, bedside commode, etc,.)?: Total Help needed moving to and from a bed to chair (including a wheelchair)?: A Little Help needed walking in hospital room?: A Little Help needed climbing 3-5 steps with a railing? : A Lot 6 Click Score: 11    End of Session Equipment Utilized During Treatment: Oxygen Activity Tolerance: Patient limited by fatigue Patient left: in chair;with call bell/phone within reach;with chair alarm set;with family/visitor present Nurse Communication: Mobility status PT Visit Diagnosis: Unsteadiness on feet (R26.81);Pain;Muscle weakness (generalized) (M62.81) Pain - Right/Left:  (bil) Pain - part of body:  (feet)     Time: 1610-9604 PT Time Calculation (min) (ACUTE ONLY): 21 min  Charges:  $Therapeutic Activity: 8-22 mins             Brailyn Killion B. Meilyn Heindl, PT, DPT [phone]   09/20/2016, 11:42 PM

## 2016-09-20 NOTE — Progress Notes (Addendum)
[facility] Liaison:  RN  Spoke with Shannon [name], [facility], about Shannon Au, MD request for [facility] to talk with patient.  She will clarify if this is a referral to hospice or if family needs information.  Per Shannon [name], [facility], patient is currently being followed by [facility] Palliative care.  She will update me.  Thank you,  Edyth Gunnels, RN, BSN [facility] Liaison [phone]  All hospital liaisons are now on [place].   **UPDATE:  Dr. Verlon [name] does not want hospice services to engage at this time.  Wants [place] to keep on our list.  If patient refuses amputation, it may become a referral.   Shannon [name] advised that they will let us know.

## 2016-09-20 NOTE — Progress Notes (Signed)
Inpatient Diabetes Program Recommendations  AACE/ADA: New Consensus Statement on Inpatient Glycemic Control (2015)  Target Ranges:  Prepandial:   less than 140 mg/dL      Peak postprandial:   less than 180 mg/dL (1-2 hours)      Critically ill patients:  140 - 180 mg/dL   Results for Shannon [name], Shannon [name] (MRN [id number]) as of 09/20/2016 13:18  Ref. Range 09/19/2016 06:51 09/19/2016 08:35 09/19/2016 12:03 09/19/2016 15:52 09/19/2016 21:35 09/20/2016 07:48 09/20/2016 11:47  Glucose-Capillary Latest Ref Range: 65 - 99 mg/dL 308 (H) 392 (H) 398 (H) 333 (H) 210 (H) 353 (H) 321 (H)   Review of Glycemic Control   Current orders for Inpatient glycemic control: Levemir 15 units daily, Novolog 0-9 units TID with meals, Novolog 3 units TID with meals  Inpatient Diabetes Program Recommendations: Insulin - Basal: Patient received Levemir 10 units at 9:00 am and Levemir 10 units at 21:36 on 09/19/16.  Fasting glucose 353 mg/dl this morning. Noted Levemir changed to 15 units daily and steroids stopped. Correction (SSI): Please consider ordering Novolog 0-5 units QHS for bedtime correction scale or if patient has poor PO intake, please change frequency of CBGs and Novolog to Q4H.  Thanks, Barnie Alderman, RN, MSN, CDE Diabetes Coordinator Inpatient Diabetes Program [phone] (Team Pager from 8am to 5pm)

## 2016-09-20 NOTE — Progress Notes (Addendum)
Patient arrived on unit from 4N. No family at bedside. Patient in low bed. Call made to 4N for floor mats.  Telemetry placed per MD order and CMT notified. Skin tear located on posterior left thigh dressed with Vaseline gauze & tegaderm.  Per patient it happed right before she came up to [place] while getting off the bedpan.

## 2016-09-20 NOTE — Progress Notes (Signed)
Nutrition Follow-up  DOCUMENTATION CODES:   Non-severe (moderate) malnutrition in context of chronic illness  INTERVENTION:   Continue nocturnal tube feeds via PEG using Osmolite 1.5 formula at rate of 65 ml/hr x 12 hours (9pm-9am).  Continue 30 ml Prostat BID (or equivalent), each supplement provides 100 kcal and 15 grams of protein.   Continue free water flushes of 230 ml BID via tube.   Tube feeding regimen provides 1370 kcal, 79 grams of protein, and 1053 ml of free water.   Continue Nepro Shake po BID, each supplement provides 425 kcal and 19 grams protein.  NUTRITION DIAGNOSIS:   Malnutrition related to chronic illness (CKD, COPD, CHF, DM, advanced age) as evidenced by moderate depletions of muscle mass, moderate depletion of body fat.  Ongoing  GOAL:   Patient will meet greater than or equal to 90% of their needs  Progressing  MONITOR:   PO intake, Supplement acceptance, Labs, Weight trends, I & O's, Skin  REASON FOR ASSESSMENT:   Consult Wound healing  ASSESSMENT:   83 y.o. female with medical history significant of DM2, COPD, HTN, CKD stage 3, CHF, diabetic foot ulcers.   Pt visiting with family members at time of visit.   Pt remains on a nectar thick, full liquid diet. Initially was refusing oral intake, but consumed 40-75% of meal trays today. Pt also accepting Nepro supplements.   Pt remains on nocturnal tube feedings of Osmolite 1.5 @ 65 ml/hr x 12 hours (9pm-9am) with 30 ml Prostat BID and 230 ml of free water BID, providing 1370 kcal, 79 grams of protein, and 1053 ml of free water.   Surgical service following; plan for arteriography later in the week. Pt is a poor surgical candidate. Hospice is following peripherally. Per MD notes, hold off on palliative care consult for now.   Labs reviewed: CBGS: 210-353.   Diet Order:  Diet full liquid Room service appropriate? Yes; Fluid consistency: Nectar Thick  Skin:  Wound (see comment) ( gangrene of  the left second toe )  Last BM:  09/20/16  Height:   Ht Readings from Last 1 Encounters:  09/15/16 5\' 6"  (1.676 m)    Weight:   Wt Readings from Last 1 Encounters:  09/15/16 140 lb 3.4 oz (63.6 kg)    Ideal Body Weight:  59 kg  BMI:  Body mass index is 22.63 kg/m.  Estimated Nutritional Needs:   Kcal:  1500-1700  Protein:  75-90 grams  Fluid:  >/= 1.5 L/day  EDUCATION NEEDS:   Education needs addressed  Andrue Dini A. Jimmye Norman, RD, LDN, CDE Pager: [phone] After hours Pager: [phone]

## 2016-09-20 NOTE — Progress Notes (Signed)
Md notified of pt's bp still 170's after prn HTZ 25 mg given.  New orders received. Will given HTZ 10 mg iv now .  Will continue to monitor. Saunders Revel T

## 2016-09-20 NOTE — Care Management Note (Addendum)
Case Management Note  Patient Details  Name: Shannon [name] MRN: [id number] Date of Birth: 1923/05/17  Subjective/Objective:      Pt admitted with SOB and cough            Action/Plan:   PTA from home with [facility] status. (agency aware of admit).  Pt is also active with [facility] for palliative services - agency contacted by CM - voicemail left.  Pt is also being followed by vascular for possible amputation.  CM will continue to follow for discharge needs.     Expected Discharge Date:                  Expected Discharge Plan:     In-House Referral:  Clinical Social Work  Discharge planning Services  CM Consult  Post Acute Care Choice:    Choice offered to:     DME Arranged:    DME Agency:     HH Arranged:    HH Agency:     Status of Service:     If discussed at [employer], dates discussed:    Additional Comments: 09/20/2016  Attending will order hospice consult when appropriate  CM spoke with attending to gain clarity with plan as intensity has decreased and as of yet to determine angiogram and amputation plan.  Per attending; pt will be moved out of [place] and provided BUN and Creatinine results as possible barrier to moving forward with procedures - attending to discuss with vascular.  Attending delaying hospice consult until final decision is rendered by family/pt regarding amputation - [facility] made aware to keep pt on radar Maryclare Labrador, RN 09/20/2016, 10:31 AM

## 2016-09-21 ENCOUNTER — Inpatient Hospital Stay (HOSPITAL_COMMUNITY): Payer: Medicare Other

## 2016-09-21 ENCOUNTER — Encounter (HOSPITAL_COMMUNITY)
Admission: EM | Disposition: A | Discharge status: Home Health | Payer: Self-pay | Source: Home / Self Care | Attending: Family Medicine

## 2016-09-21 ENCOUNTER — Encounter (HOSPITAL_COMMUNITY): Payer: Self-pay | Admitting: Vascular Surgery

## 2016-09-21 HISTORY — PX: LOWER EXTREMITY ANGIOGRAPHY: CATH118251

## 2016-09-21 HISTORY — PX: PERIPHERAL VASCULAR BALLOON ANGIOPLASTY: CATH118281

## 2016-09-21 HISTORY — PX: ABDOMINAL AORTOGRAM: CATH118222

## 2016-09-21 LAB — PROTIME-INR
INR: 1.41
Prothrombin Time: 17.3 seconds — ABNORMAL HIGH (ref 11.4–15.2)

## 2016-09-21 LAB — CBC
HCT: 30.9 % — ABNORMAL LOW (ref 36.0–46.0)
Hemoglobin: 10 g/dL — ABNORMAL LOW (ref 12.0–15.0)
MCH: 28.7 pg (ref 26.0–34.0)
MCHC: 32.4 g/dL (ref 30.0–36.0)
MCV: 88.5 fL (ref 78.0–100.0)
Platelets: 400 10*3/uL (ref 150–400)
RBC: 3.49 MIL/uL — ABNORMAL LOW (ref 3.87–5.11)
RDW: 17.8 % — ABNORMAL HIGH (ref 11.5–15.5)
WBC: 16.7 10*3/uL — ABNORMAL HIGH (ref 4.0–10.5)

## 2016-09-21 LAB — GLUCOSE, CAPILLARY
Glucose-Capillary: 137 mg/dL — ABNORMAL HIGH (ref 65–99)
Glucose-Capillary: 183 mg/dL — ABNORMAL HIGH (ref 65–99)
Glucose-Capillary: 210 mg/dL — ABNORMAL HIGH (ref 65–99)
Glucose-Capillary: 75 mg/dL (ref 65–99)

## 2016-09-21 LAB — POCT ACTIVATED CLOTTING TIME
Activated Clotting Time: 197 seconds
Activated Clotting Time: 153 seconds

## 2016-09-21 SURGERY — ABDOMINAL AORTOGRAM
Anesthesia: LOCAL

## 2016-09-21 MED ORDER — ACETAMINOPHEN 325 MG PO TABS: 650.0000 mg | ORAL_TABLET | ORAL | Status: DC | PRN

## 2016-09-21 MED ORDER — FENTANYL CITRATE (PF) 100 MCG/2ML IJ SOLN
INTRAMUSCULAR | Status: AC
  Filled 2016-09-21: qty 2

## 2016-09-21 MED ORDER — HEPARIN (PORCINE) IN NACL 2-0.9 UNIT/ML-% IJ SOLN
INTRAMUSCULAR | Status: AC
  Filled 2016-09-21: qty 1000

## 2016-09-21 MED ORDER — HYDRALAZINE HCL 20 MG/ML IJ SOLN
INTRAMUSCULAR | Status: AC
Start: 2016-09-21 — End: 2016-09-21
  Filled 2016-09-21: qty 1

## 2016-09-21 MED ORDER — HYDRALAZINE HCL 20 MG/ML IJ SOLN
INTRAMUSCULAR | Status: DC | PRN
  Administered 2016-09-21: 10 mg via INTRAVENOUS

## 2016-09-21 MED ORDER — CLOPIDOGREL BISULFATE 75 MG PO TABS
150.0000 mg | ORAL_TABLET | Freq: Once | ORAL | Status: AC
  Administered 2016-09-21: 17:00:00 150 mg via ORAL
  Filled 2016-09-21: qty 2

## 2016-09-21 MED ORDER — LIDOCAINE HCL (PF) 1 % IJ SOLN
INTRAMUSCULAR | Status: AC
  Filled 2016-09-21: qty 30

## 2016-09-21 MED ORDER — ENOXAPARIN SODIUM 80 MG/0.8ML ~~LOC~~ SOLN
1.0000 mg/kg | SUBCUTANEOUS | Status: DC
  Filled 2016-09-21: qty 0.65

## 2016-09-21 MED ORDER — SODIUM CHLORIDE 0.9 % IV SOLN
1.0000 mL/kg/h | INTRAVENOUS | Status: DC
  Administered 2016-09-21: 1 mL/kg/h via INTRAVENOUS

## 2016-09-21 MED ORDER — LIDOCAINE HCL (PF) 1 % IJ SOLN
INTRAMUSCULAR | Status: DC | PRN
  Administered 2016-09-21: 15 mL

## 2016-09-21 MED ORDER — ANGIOPLASTY BOOK
Freq: Once | Status: AC
  Administered 2016-09-22: 01:00:00
  Filled 2016-09-21: qty 1

## 2016-09-21 MED ORDER — SODIUM CHLORIDE 0.9 % IV SOLN
INTRAVENOUS | Status: DC
  Administered 2016-09-21: 11:00:00 via INTRAVENOUS

## 2016-09-21 MED ORDER — HEPARIN SODIUM (PORCINE) 1000 UNIT/ML IJ SOLN
INTRAMUSCULAR | Status: DC | PRN
  Administered 2016-09-21: 6000 [IU] via INTRAVENOUS

## 2016-09-21 MED ORDER — FENTANYL CITRATE (PF) 100 MCG/2ML IJ SOLN
INTRAMUSCULAR | Status: DC | PRN
  Administered 2016-09-21: 25 ug via INTRAVENOUS

## 2016-09-21 MED ORDER — CLOPIDOGREL BISULFATE 75 MG PO TABS
75.0000 mg | ORAL_TABLET | Freq: Every day | ORAL | Status: DC
  Administered 2016-09-22 – 2016-09-27 (×6): 75 mg via ORAL
  Filled 2016-09-21 (×6): qty 1

## 2016-09-21 MED ORDER — IODIXANOL 320 MG/ML IV SOLN
INTRAVENOUS | Status: DC | PRN
  Administered 2016-09-21: 130 mL via INTRAVENOUS

## 2016-09-21 MED ORDER — ATROPINE SULFATE 1 MG/10ML IJ SOSY
PREFILLED_SYRINGE | INTRAMUSCULAR | Status: AC
  Filled 2016-09-21: qty 10

## 2016-09-21 MED ORDER — HEPARIN SODIUM (PORCINE) 1000 UNIT/ML IJ SOLN
INTRAMUSCULAR | Status: AC
Start: 2016-09-21 — End: 2016-09-21
  Filled 2016-09-21: qty 1

## 2016-09-21 MED ORDER — INSULIN ASPART 100 UNIT/ML ~~LOC~~ SOLN
0.0000 [IU] | SUBCUTANEOUS | Status: DC
  Administered 2016-09-21 – 2016-09-22 (×2): 3 [IU] via SUBCUTANEOUS
  Administered 2016-09-22: 08:00:00 5 [IU] via SUBCUTANEOUS
  Administered 2016-09-22: 06:00:00 7 [IU] via SUBCUTANEOUS

## 2016-09-21 MED ORDER — ENOXAPARIN SODIUM 80 MG/0.8ML ~~LOC~~ SOLN: 1.0000 mg/kg | SUBCUTANEOUS | Status: DC

## 2016-09-21 MED ORDER — DEXTROSE-NACL 5-0.45 % IV SOLN
INTRAVENOUS | Status: DC
  Administered 2016-09-21: 12:00:00 via INTRAVENOUS

## 2016-09-21 MED ORDER — HEPARIN (PORCINE) IN NACL 2-0.9 UNIT/ML-% IJ SOLN
INTRAMUSCULAR | Status: AC | PRN
  Administered 2016-09-21: 1000 mL

## 2016-09-21 MED ORDER — CLONIDINE HCL 0.1 MG PO TABS
0.2000 mg | ORAL_TABLET | Freq: Once | ORAL | Status: AC
  Administered 2016-09-21: 0.2 mg via ORAL
  Filled 2016-09-21: qty 2

## 2016-09-21 MED ORDER — OXYCODONE-ACETAMINOPHEN 5-325 MG PO TABS
1.0000 | ORAL_TABLET | ORAL | Status: DC | PRN
  Administered 2016-09-25: 2 via ORAL
  Administered 2016-09-26 (×2): 1 via ORAL
  Filled 2016-09-21 (×2): qty 1
  Filled 2016-09-21: qty 2

## 2016-09-21 MED ORDER — ONDANSETRON HCL 4 MG/2ML IJ SOLN: 4.0000 mg | Freq: Four times a day (QID) | INTRAMUSCULAR | Status: DC | PRN

## 2016-09-21 SURGICAL SUPPLY — 25 items
BALLN ARMADA 18PTA 4.0X200X4 (BALLOONS) ×3
BALLN IN.PACT DCB 5X150 (BALLOONS) ×3
BALLOON ARMADA 18PTA 4.0X200X4 (BALLOONS) ×2 IMPLANT
CATH CXI SUPP ST 2.6FR 150CM (CATHETERS) ×3 IMPLANT
CATH OMNI FLUSH 5F 65CM (CATHETERS) ×3 IMPLANT
CATH SOFT-VU 4F 65 STRAIGHT (CATHETERS) ×2 IMPLANT
CATH SOFT-VU STRAIGHT 4F 65CM (CATHETERS) ×1
COVER PRB 48X5XTLSCP FOLD TPE (BAG) ×2 IMPLANT
COVER PROBE 5X48 (BAG) ×1
DCB IN.PACT 5X150 (BALLOONS) ×2 IMPLANT
DEVICE CONTINUOUS FLUSH (MISCELLANEOUS) ×3 IMPLANT
DEVICE TORQUE .025-.038 (MISCELLANEOUS) ×3 IMPLANT
GUIDEWIRE ANGLED .035X150CM (WIRE) ×3 IMPLANT
KIT ENCORE 26 ADVANTAGE (KITS) ×3 IMPLANT
KIT MICROINTRODUCER STIFF 5F (SHEATH) ×3 IMPLANT
KIT PV (KITS) ×3 IMPLANT
SHEATH HIGHFLEX ANSEL 6FRX55 (SHEATH) ×3 IMPLANT
SHEATH PINNACLE 5F 10CM (SHEATH) ×3 IMPLANT
STOPCOCK MORSE 400PSI 3WAY (MISCELLANEOUS) ×3 IMPLANT
SYR MEDRAD MARK V 150ML (SYRINGE) ×3 IMPLANT
TRANSDUCER W/STOPCOCK (MISCELLANEOUS) ×3 IMPLANT
TRAY PV CATH (CUSTOM PROCEDURE TRAY) ×3 IMPLANT
WIRE BENTSON .035X145CM (WIRE) ×3 IMPLANT
WIRE G V18X300CM (WIRE) ×3 IMPLANT
WIRE ROSEN-J .035X260CM (WIRE) ×6 IMPLANT

## 2016-09-21 NOTE — Op Note (Signed)
Patient name: Shannon [name] MRN: [id number] DOB: 11-24-1923 Sex: female  09/21/2016 Pre-operative Diagnosis: critical left foot ischemia Post-operative diagnosis:  Same Surgeon:  Apolinar Junes C. Randie Heinz, MD Procedure Performed: 1.  US guided cannulation of right common femoral artery 2.  Aortogram with left lower extremity runoff 3.  Drug coated balloon angioplasty of left sfa with 5mm impact admiral  Indications:  83 year old female with history of left second toe gangrene. She has history also of renal insufficiency. She is indicated for angiogram with possible intervention.  Findings: Aorta and iliac arteries are calcified although there is no flow-limiting stenosis. Right hypogastric artery is stenosed at its origin greater than 50%. Left superficial femoral artery has a proximal 50% non-flow-limiting stenosis. Distally the superficial femoral artery into the proximal popliteal artery is heavily calcified with stenosis greater than 80%. At completion there is less than 20% residual stenosis after drug-coated balloon angioplasty. Runoff to the foot was via the posterior tibial artery dominant with reconstituted dorsalis pedis artery.   Procedure:  The patient was identified in the holding area and taken to room 8.  The patient was then placed supine on the table and prepped and draped in the usual sterile fashion.  A time out was called.  Ultrasound was used to evaluate the right common femoral artery.  It was patent .  A digital ultrasound image was acquired.  A micropuncture needle was used to access the right common femoral artery under ultrasound guidance.  An 018 wire was advanced without resistance and a micropuncture sheath was placed.  The 018 wire was removed and a benson wire was placed.  The micropuncture sheath was exchanged for a 5 french sheath.  An omniflush catheter was advanced over the wire to the level of L-1.  An abdominal angiogram was obtained.  Next, using a straight catheter and  Glidewire were able to maneuver over the bifurcation and performed left lower extremity angiogram with the above findings. We then placed a stiff wire to the SFA and placed a long 6 French sheath. Using the V18 and CXI catheter we were able to cross the multiple stenosis of the SFA. These were then predilated with long 4 mm balloon. We then used 5 mm impact admiral drug-coated balloon angioplasty of the SFA in 2 spots. Completion angiogram demonstrated brisk runoff through the affected areas with less than 20% residual stenosis. Dominant runoff on the left was via the posterior tibial artery reconstituted dorsalis pedis. Satisfied with the results the sheath was risen retracted into the right external iliac artery and wire removed. Patient tolerated procedure well without immediate complication.  Contrast: 130cc     Brandon C. Randie Heinz, MD [facility] Office: [phone] Pager: [phone]

## 2016-09-21 NOTE — Progress Notes (Signed)
  Progress Note    09/21/2016 12:33 PM * No surgery date entered *  Subjective:  Right foot pain today  Vitals:   09/21/16 0500 09/21/16 0755  BP: (!) 154/61 140/65  Pulse: 74 69  Resp: 17 18  Temp: 98.3 F (36.8 C) 98 F (36.7 C)    Physical Exam: Awake and alert Non labored respirations Abdomen is soft Palpable femoral pulses bilaterally Dry gangrene of left 2nd toe  CBC    Component Value Date/Time   WBC 16.7 (H) 09/21/2016 0504   RBC 3.49 (L) 09/21/2016 0504   HGB 10.0 (L) 09/21/2016 0504   HGB 10.5 (L) 06/27/2016 1054   HCT 30.9 (L) 09/21/2016 0504   HCT 32.0 (L) 06/27/2016 1054   PLT 400 09/21/2016 0504   PLT 368 06/27/2016 1054   MCV 88.5 09/21/2016 0504   MCV 89 06/27/2016 1054   MCH 28.7 09/21/2016 0504   MCHC 32.4 09/21/2016 0504   RDW 17.8 (H) 09/21/2016 0504   RDW 21.1 (H) 06/27/2016 1054   LYMPHSABS 1.3 09/19/2016 1104   LYMPHSABS 1.2 10/06/2015 1151   MONOABS 0.9 09/19/2016 1104   EOSABS 0.0 09/19/2016 1104   EOSABS 2.4 (H) 10/06/2015 1151   BASOSABS 0.0 09/19/2016 1104   BASOSABS 0.1 10/06/2015 1151    BMET    Component Value Date/Time   NA 140 09/20/2016 0608   NA 133 (A) 06/30/2016   K 3.8 09/20/2016 0608   CL 104 09/20/2016 0608   CO2 28 09/20/2016 0608   GLUCOSE 358 (H) 09/20/2016 0608   BUN 54 (H) 09/20/2016 0608   BUN 59 (A) 06/30/2016   CREATININE 1.05 (H) 09/20/2016 0608   CREATININE 1.20 (H) 08/07/2016 1424   CALCIUM 9.2 09/20/2016 0608   GFRNONAA 45 (L) 09/20/2016 0608   GFRNONAA 40 (L) 08/07/2016 1424   GFRAA 52 (L) 09/20/2016 0608   GFRAA 46 (L) 08/07/2016 1424    INR    Component Value Date/Time   INR 1.41 09/21/2016 0504     Intake/Output Summary (Last 24 hours) at 09/21/16 1233 Last data filed at 09/21/16 0900  Gross per 24 hour  Intake           729.59 ml  Output              201 ml  Net           528.59 ml     Assessment:  83 y.o. female is here with pain in left foot and dry gangrene of 2nd  toe  Plan: Aortogram with left lower extremity runoff and possible intervention today. Discussed risks and benefits and questions answered.    Imonie Tuch C. Donzetta Matters, MD [facility] Office: [phone] Pager: [phone]  09/21/2016 12:33 PM

## 2016-09-21 NOTE — Progress Notes (Signed)
Pt going down for procedure. Pt was unsure of what exactly the procedure was.  Will let doctor explain procedure and obtain consent.  Family at bedside.

## 2016-09-21 NOTE — Progress Notes (Signed)
PROGRESS NOTE    Shannon [name]  QMV:[id number] DOB: [birth year] DOA: 09/14/2016 PCP: Kirt Boys, DO  Outpatient Specialists:     Brief Narrative:   57 fem CKD III Afib CHad2vasc2 = 6 + Tachybradysyndrome, on coumadin COPD DVT in past CVA Chr L Cerebellear infracts HTN ty II DM GO Obstrution s/p G tube HLD GERD Diastolic HF hypothryoid Pressure ulcer  This is her 8th recent admit She has been offered pallaitive care in the past as her condition is the sum of her mutiple medical issues issues  prior unwilling to undergo amoputation and wishes to try medication only but now has recanted and is willing for surgery Anxiety has affected her care   Assessment & Plan:   Principal Problem:   COPD exacerbation (HCC) Active Problems:   Diabetes mellitus type 2 in nonobese (HCC)   Chronic kidney insufficiency, stage 3 (moderate)   Accelerated hypertension   Hyponatremia   Chronic diastolic CHF (congestive heart failure) (HCC)   Atherosclerosis of native arteries of the extremities with gangrene (HCC)   Gangrene of toe of left foot (HCC)  Respiratory Acute hypoxemic respiratory failure--not on Home O2 COPD exacerbation Left-sided pleural effusion-tiny and non-amenable to [facility] 6/17  ABG 6/17 on venturi mask 50% shows alkalosis--?anxious--add roxanol 5 q 4 prn SOB  weaned steroids IV to prednisone 40mg  6/18 and tapered off 6/20  Now on 3 Liters Oxygen and improved-Sats above 90.    Rn aware Re: Incentive spirometry, ambulate for desat screen   2 vw CXR 6/21 shows atelectasis--has no fever--not overly concerned re: PNA  albuterol 2.5 q2 prn, Flonase 50 g bid-added Dulera 6/18--Azithromycin  6/18 stopped  Pleuritic CP 6/19  EKG: normal EKG, normal sinus rhythm, normal sinus rhythm, qrs AXIS 0, PR 0.08  Troponin grey zone and reassuring  L foot gangrene Prior DVT Leukocytosis without fever likely de margination from IV solu-medrol initially used Has ischemic toe.     No need abx currently-- demarcating dry gangrene  Angio scheduled 621   INR 1.4  Lovenox per [facility]-when to resume post-op  Afib CHad2 Vasc2= 6 Tachybrady Mod ctrll HTN Diastolic CHF EF 60-65% CAD  Cont Coreg 6.25-->12.6 on 6/20, IMdur 30 xl  Continue Prn hydralazine 25  Lasix stopped 6/18  Lovenox   GO Obst 09/2015 GERd  Cont Tube feeds once procedure complete 6/21  Can take PO liquids--cont free water 230 ml bid once procedure done  Cont MirLAX  Dm tY ii C/B NEPHRO/NEUROPATHY Mild DKA resolved quickly 6/18-6/29 and insulin GTT d/c 6/19 am  Transfer out SDU -->6e--add back SSI [no long acting at home]. levemir 12 U--15 U 6/20 am  hyperglycemia improved off steroid  change to q4 checks as NPO for procedure 6/21  Cont gabapentin 600 tid--monitor bmet  CKD stg III   basleine GFR anywhere 25-40  Off lasix since 6/18  CVA c Chr Infarcts   hypothryoid  Cont synthroid 137 mcg  TSH in 3 week  Depression, situational  Cont sertraline 100 qd  Global  Asked Hospice/[facility] [place] to peripherally follow   Patient may change mind re amputation-then will need comfort care   SCD and coumadin Partial code Discussed in detail with daughter bedsdie Unclear when d/c--will likely need SNF   Consultants:   vasc  Procedures:   None   Antimicrobials:   Cefepime and flagyll 6/14-->6/17  azithro 6/14-->6/18    Subjective:  Off oxygen Looks well No cp No feevr no chhills no sob  No n  NPO for procedure  Objective: Vitals:   09/21/16 0500 09/21/16 0755 09/21/16 0814 09/21/16 0816  BP: (!) 154/61 140/65    Pulse: 74 69    Resp: 17 18    Temp: 98.3 F (36.8 C) 98 F (36.7 C)    TempSrc: Oral Oral    SpO2: 100% 100% 94% 94%  Weight:      Height:        Intake/Output Summary (Last 24 hours) at 09/21/16 1049 Last data filed at 09/21/16 0900  Gross per 24 hour  Intake           729.59 ml  Output              201 ml  Net           528.59 ml   Filed Weights    09/15/16 0212 09/20/16 2141  Weight: 63.6 kg (140 lb 3.4 oz) 60.8 kg (134 lb)    Examination:  eomi ncat, no JVD Pleasant, hungry No pallor no ict cta b s1 s 2no m/r/g abd slight distended, no yeast-post-op changes, PEG in place L 2nd toe not examined today   Data Reviewed: I have personally reviewed following labs and imaging studies  CBC:  Recent Labs Lab 09/14/16 2140  09/18/16 0655 09/19/16 0259 09/19/16 1104 09/20/16 0608 09/21/16 0504  WBC 10.6*  < > 12.9* 20.0* 18.1* 16.8* 16.7*  NEUTROABS 9.3*  --   --  16.1* 15.9*  --   --   HGB 10.6*  < > 10.5* 10.1* 9.8* 10.5* 10.0*  HCT 31.0*  < > 32.9* 31.2* 30.4* 31.7* 30.9*  MCV 85.9  < > 89.2 89.4 88.4 88.5 88.5  PLT 342  < > 394 378 361 375 400  < > = values in this interval not displayed. Basic Metabolic Panel:  Recent Labs Lab 09/18/16 2317 09/19/16 0259 09/19/16 0715 09/19/16 1104 09/20/16 0608  NA 138 137 136 136 140  K 4.0 4.0 4.0 4.0 3.8  CL 104 103 102 103 104  CO2 26 26 28 25 28   GLUCOSE 159* 341* 387* 415* 358*  BUN 54* 54* 53* 50* 54*  CREATININE 1.12* 1.14* 1.12* 1.06* 1.05*  CALCIUM 9.2 9.0 9.0 8.9 9.2   GFR: Estimated Creatinine Clearance: 32.7 mL/min (A) (by C-G formula based on SCr of 1.05 mg/dL (H)). Liver Function Tests:  Recent Labs Lab 09/19/16 0259 09/20/16 0608  AST 20 19  ALT 16 16  ALKPHOS 62 75  BILITOT 0.3 0.4  PROT 6.4* 6.5  ALBUMIN 2.5* 2.4*   No results for input(s): LIPASE, AMYLASE in the last 168 hours. No results for input(s): AMMONIA in the last 168 hours. Coagulation Profile:  Recent Labs Lab 09/17/16 1233 09/18/16 0655 09/19/16 0259 09/20/16 0608 09/21/16 0504  INR 1.96 1.81 1.83 1.52 1.41   Cardiac Enzymes:  Recent Labs Lab 09/14/16 2140 09/15/16 0745 09/15/16 1445 09/19/16 1104  TROPONINI 0.03* <0.03 <0.03 0.03*   BNP (last 3 results)  Recent Labs  05/08/16 1053  PROBNP 1,132*   HbA1C: No results for input(s): HGBA1C in the last 72  hours. CBG:  Recent Labs Lab 09/20/16 0748 09/20/16 1147 09/20/16 1710 09/20/16 2139 09/21/16 0725  GLUCAP 353* 321* 276* 212* 137*   Lipid Profile: No results for input(s): CHOL, HDL, LDLCALC, TRIG, CHOLHDL, LDLDIRECT in the last 72 hours. Thyroid Function Tests: No results for input(s): TSH, T4TOTAL, FREET4, T3FREE, THYROIDAB in the last 72 hours. Anemia Panel: No results for input(s): VITAMINB12,  FOLATE, FERRITIN, TIBC, IRON, RETICCTPCT in the last 72 hours. Urine analysis:    Component Value Date/Time   COLORURINE YELLOW 06/08/2016 1647   APPEARANCEUR CLEAR 06/08/2016 1647   LABSPEC 1.006 06/08/2016 1647   PHURINE 8.0 06/08/2016 1647   GLUCOSEU NEGATIVE 06/08/2016 1647   HGBUR NEGATIVE 06/08/2016 1647   BILIRUBINUR NEGATIVE 06/08/2016 1647   KETONESUR NEGATIVE 06/08/2016 1647   PROTEINUR NEGATIVE 06/08/2016 1647   UROBILINOGEN 0.2 01/27/2015 2308   NITRITE NEGATIVE 06/08/2016 1647   LEUKOCYTESUR NEGATIVE 06/08/2016 1647   Sepsis Labs: @LABRCNTIP (procalcitonin:4,lacticidven:4)  ) Recent Results (from the past 240 hour(s))  Culture, blood (Routine X 2) w Reflex to ID Panel     Status: Abnormal   Collection Time: 09/14/16  9:48 PM  Result Value Ref Range Status   Specimen Description BLOOD RIGHT ANTECUBITAL  Final   Special Requests   Final    BOTTLES DRAWN AEROBIC AND ANAEROBIC Blood Culture adequate volume   Culture  Setup Time   Final    GRAM POSITIVE COCCI IN CLUSTERS AEROBIC BOTTLE ONLY CRITICAL RESULT CALLED TO, READ BACK BY AND VERIFIED WITH: K.DUPREE,RN AT 2130 ON 09/18/16 BY [email or web address]    Culture (A)  Final    MICROCOCCUS SPECIES Standardized susceptibility testing for this organism is not available. Performed at [facility], [address] [place], [place] [address]    Report Status 09/19/2016 FINAL  Final  Blood Culture ID Panel (Reflexed)     Status: None   Collection Time: 09/14/16  9:48 PM  Result Value Ref Range Status   Enterococcus  species NOT DETECTED NOT DETECTED Final   Listeria monocytogenes NOT DETECTED NOT DETECTED Final   Staphylococcus species NOT DETECTED NOT DETECTED Final   Staphylococcus aureus NOT DETECTED NOT DETECTED Final   Streptococcus species NOT DETECTED NOT DETECTED Final   Streptococcus agalactiae NOT DETECTED NOT DETECTED Final   Streptococcus pneumoniae NOT DETECTED NOT DETECTED Final   Streptococcus pyogenes NOT DETECTED NOT DETECTED Final   Acinetobacter baumannii NOT DETECTED NOT DETECTED Final   Enterobacteriaceae species NOT DETECTED NOT DETECTED Final   Enterobacter cloacae complex NOT DETECTED NOT DETECTED Final   Escherichia coli NOT DETECTED NOT DETECTED Final   Klebsiella oxytoca NOT DETECTED NOT DETECTED Final   Klebsiella pneumoniae NOT DETECTED NOT DETECTED Final   Proteus species NOT DETECTED NOT DETECTED Final   Serratia marcescens NOT DETECTED NOT DETECTED Final   Haemophilus influenzae NOT DETECTED NOT DETECTED Final   Neisseria meningitidis NOT DETECTED NOT DETECTED Final   Pseudomonas aeruginosa NOT DETECTED NOT DETECTED Final   Candida albicans NOT DETECTED NOT DETECTED Final   Candida glabrata NOT DETECTED NOT DETECTED Final   Candida krusei NOT DETECTED NOT DETECTED Final   Candida parapsilosis NOT DETECTED NOT DETECTED Final   Candida tropicalis NOT DETECTED NOT DETECTED Final    Comment: Performed at [facility], [address] [place], [place] [address]  Culture, blood (Routine X 2) w Reflex to ID Panel     Status: None   Collection Time: 09/14/16 11:25 PM  Result Value Ref Range Status   Specimen Description BLOOD BLOOD LEFT HAND  Final   Special Requests   Final    BOTTLES DRAWN AEROBIC AND ANAEROBIC Blood Culture adequate volume   Culture   Final    NO GROWTH 5 DAYS Performed at [facility], [address], [place], [place] [address]    Report Status 09/20/2016 FINAL  Final  MRSA  PCR Screening     Status: None   Collection Time: 09/15/16   5:16 PM  Result Value Ref Range Status   MRSA by PCR NEGATIVE NEGATIVE Final    Comment:        The GeneXpert MRSA Assay (FDA approved for NASAL specimens only), is one component of a comprehensive MRSA colonization surveillance program. It is not intended to diagnose MRSA infection nor to guide or monitor treatment for MRSA infections.      Radiology Studies: Dg Chest 2 View  Result Date: 09/21/2016 CLINICAL DATA:  Cough, shortness of breath today, history of pneumonia EXAM: CHEST  2 VIEW COMPARISON:  Chest x-ray of 09/17/2016 and 09/14/2016 FINDINGS: There has been improvement in opacity at the left lung base with a small left pleural effusion and mild left basilar atelectasis remaining. Vague opacity remains anteriorly on the lateral view probably in the right upper lobe anteriorly consistent with linear atelectasis or pneumonia. Cardiomegaly is stable. No acute bony abnormality is seen. IMPRESSION: 1. Improved aeration mild left basilar atelectasis and possibly small left effusion. 2. Mild linear atelectasis or residual pneumonia in the anterior right upper lobe. Electronically Signed   By: Dwyane Dee M.D.   On: 09/21/2016 08:01     Scheduled Meds: . atorvastatin  10 mg Oral q1800  . calcium-vitamin D  1 tablet Oral Q breakfast  . carvedilol  12.5 mg Oral BID  . cycloSPORINE  1 drop Both Eyes BID  . enoxaparin (LOVENOX) injection  1 mg/kg Subcutaneous Q24H  . feeding supplement (NEPRO CARB STEADY)  237 mL Oral BID BM  . feeding supplement (PRO-STAT SUGAR FREE 64)  30 mL Per Tube BID  . fluticasone  1 spray Each Nare BID  . free water  230 mL Per Tube BID  . gabapentin  600 mg Oral TID  . insulin aspart  0-9 Units Subcutaneous TID WC  . insulin aspart  3 Units Subcutaneous TID WC  . insulin detemir  15 Units Subcutaneous Daily  . ipratropium-albuterol  3 mL Nebulization TID  . isosorbide mononitrate  30 mg Oral Daily  . latanoprost  1 drop Both Eyes QHS  . levothyroxine   137 mcg Oral QAC breakfast  . mometasone-formoterol  2 puff Inhalation BID  . multivitamin with minerals  1 tablet Oral Daily  . nystatin   Topical TID  . polyvinyl alcohol  1 drop Both Eyes QID  . sertraline  100 mg Oral Daily   Continuous Infusions: . sodium chloride 100 mL/hr at 09/21/16 1041  . feeding supplement (OSMOLITE 1.5 CAL) Stopped (09/21/16 0002)     LOS: 6 days    Time spent: 79    Pleas Koch, MD [facility]   If 7PM-7AM, please contact night-coverage [email or web address] Password [facility] 09/21/2016, 10:49 AM

## 2016-09-21 NOTE — Progress Notes (Signed)
Site area: right groin  Site Prior to Removal:  Level 0  Pressure Applied For 20 MINUTES    Minutes Beginning at 2100    Manual:   Yes.    Patient Status During Pull:  stable  Post Pull Groin Site:  Level 0  Post Pull Instructions Given:  Yes.    Post Pull Pulses Present:  Yes.    Dressing Applied:  Yes.    Comments:  Groin site teaching reinforced. Teach back done.

## 2016-09-21 NOTE — Care Management Important Message (Signed)
Important Message  Patient Details  Name: Shannon [name] MRN: [id number] Date of Birth: 01-Oct-1923   Medicare Important Message Given:  Yes    Christopher Glasscock 09/21/2016, 12:06 PM

## 2016-09-22 ENCOUNTER — Ambulatory Visit: Payer: Medicare Other | Admitting: Internal Medicine

## 2016-09-22 LAB — CBC
HCT: 31.5 % — ABNORMAL LOW (ref 36.0–46.0)
Hemoglobin: 10 g/dL — ABNORMAL LOW (ref 12.0–15.0)
MCH: 28.4 pg (ref 26.0–34.0)
MCHC: 31.7 g/dL (ref 30.0–36.0)
MCV: 89.5 fL (ref 78.0–100.0)
Platelets: 389 10*3/uL (ref 150–400)
RBC: 3.52 MIL/uL — ABNORMAL LOW (ref 3.87–5.11)
RDW: 18.2 % — ABNORMAL HIGH (ref 11.5–15.5)
WBC: 11.4 10*3/uL — ABNORMAL HIGH (ref 4.0–10.5)

## 2016-09-22 LAB — GLUCOSE, CAPILLARY
Glucose-Capillary: 249 mg/dL — ABNORMAL HIGH (ref 65–99)
Glucose-Capillary: 304 mg/dL — ABNORMAL HIGH (ref 65–99)
Glucose-Capillary: 296 mg/dL — ABNORMAL HIGH (ref 65–99)
Glucose-Capillary: 286 mg/dL — ABNORMAL HIGH (ref 65–99)
Glucose-Capillary: 148 mg/dL — ABNORMAL HIGH (ref 65–99)
Glucose-Capillary: 121 mg/dL — ABNORMAL HIGH (ref 65–99)

## 2016-09-22 LAB — PROTIME-INR
INR: 1.21
Prothrombin Time: 15.3 seconds — ABNORMAL HIGH (ref 11.4–15.2)

## 2016-09-22 LAB — COMPREHENSIVE METABOLIC PANEL
ALT: 25 U/L (ref 14–54)
AST: 30 U/L (ref 15–41)
Albumin: 2.2 g/dL — ABNORMAL LOW (ref 3.5–5.0)
Alkaline Phosphatase: 61 U/L (ref 38–126)
Anion gap: 6 (ref 5–15)
BUN: 43 mg/dL — ABNORMAL HIGH (ref 6–20)
CO2: 29 mmol/L (ref 22–32)
Calcium: 8.7 mg/dL — ABNORMAL LOW (ref 8.9–10.3)
Chloride: 106 mmol/L (ref 101–111)
Creatinine, Ser: 0.99 mg/dL (ref 0.44–1.00)
GFR calc Af Amer: 56 mL/min — ABNORMAL LOW (ref >60)
GFR calc non Af Amer: 48 mL/min — ABNORMAL LOW (ref >60)
Glucose, Bld: 298 mg/dL — ABNORMAL HIGH (ref 65–99)
Potassium: 4.1 mmol/L (ref 3.5–5.1)
Sodium: 141 mmol/L (ref 135–145)
Total Bilirubin: 0.4 mg/dL (ref 0.3–1.2)
Total Protein: 5.9 g/dL — ABNORMAL LOW (ref 6.5–8.1)

## 2016-09-22 MED ORDER — INSULIN ASPART 100 UNIT/ML ~~LOC~~ SOLN
3.0000 [IU] | Freq: Three times a day (TID) | SUBCUTANEOUS | Status: DC
  Administered 2016-09-22 – 2016-09-27 (×12): 3 [IU] via SUBCUTANEOUS

## 2016-09-22 MED ORDER — INSULIN ASPART 100 UNIT/ML ~~LOC~~ SOLN
0.0000 [IU] | Freq: Three times a day (TID) | SUBCUTANEOUS | Status: DC
  Administered 2016-09-22: 5 [IU] via SUBCUTANEOUS
  Administered 2016-09-22: 1 [IU] via SUBCUTANEOUS
  Administered 2016-09-23: 2 [IU] via SUBCUTANEOUS
  Administered 2016-09-23: 3 [IU] via SUBCUTANEOUS
  Administered 2016-09-23: 5 [IU] via SUBCUTANEOUS
  Administered 2016-09-24: 2 [IU] via SUBCUTANEOUS
  Administered 2016-09-24: 7 [IU] via SUBCUTANEOUS
  Administered 2016-09-24 – 2016-09-25 (×2): 2 [IU] via SUBCUTANEOUS
  Administered 2016-09-26: 3 [IU] via SUBCUTANEOUS
  Administered 2016-09-26: 2 [IU] via SUBCUTANEOUS
  Administered 2016-09-27 (×2): 3 [IU] via SUBCUTANEOUS

## 2016-09-22 MED ORDER — ENOXAPARIN SODIUM 80 MG/0.8ML ~~LOC~~ SOLN
1.0000 mg/kg | SUBCUTANEOUS | Status: DC
  Administered 2016-09-22 – 2016-09-24 (×3): 65 mg via SUBCUTANEOUS
  Filled 2016-09-22: qty 0.65
  Filled 2016-09-22: qty 0.8
  Filled 2016-09-22: qty 0.65
  Filled 2016-09-22: qty 0.8

## 2016-09-22 NOTE — Progress Notes (Signed)
ANTICOAGULATION CONSULT NOTE - Follow Up Consult  Pharmacy Consult for [place] Indication: h/o DVT  Labs:  Recent Labs  09/19/16 1104 09/20/16 0608 09/21/16 0504 09/22/16 0409  HGB 9.8* 10.5* 10.0* 10.0*  HCT 30.4* 31.7* 30.9* 31.5*  PLT 361 375 400 389  LABPROT  --  18.5* 17.3* 15.3*  INR  --  1.52 1.41 1.21  CREATININE 1.06* 1.05*  --  0.99  TROPONINI 0.03*  --   --   --     Assessment: 83yo female on Coumadin 3.75mg  daily exc for 2.5mg  on Mon PTA for hx of DVT. CHADsVASC = 6. INR was therapeutic on home dose at last coumadin clinic visit. Bridged with heparin for arteriogram and was delayed at first, but now on enoxaparin s/p arteriogram on 6/21.  INR trending down to 1.21. Hgb 10, plts wnl. No s/s of bleed.  Goal of Therapy:  Heparin level 0.3-0.7 units/ml  INR goal 2-3   Plan:  Continue enoxaparin 65mg  [place] Q24h Monitor CBC, s/s of bleed F/U restart of Coumadin  [name], PharmD, [facility] Clinical Pharmacist Pager [phone] 09/22/2016 8:33 AM

## 2016-09-22 NOTE — Progress Notes (Addendum)
Inpatient Diabetes Program Recommendations  AACE/ADA: New Consensus Statement on Inpatient Glycemic Control (2015)  Target Ranges:  Prepandial:   less than 140 mg/dL      Peak postprandial:   less than 180 mg/dL (1-2 hours)      Critically ill patients:  140 - 180 mg/dL  Results for GLADY, OUDERKIRK (MRN [id number]) as of 09/22/2016 09:34  Ref. Range 09/21/2016 07:25 09/21/2016 11:45 09/21/2016 16:32 09/21/2016 21:10 09/22/2016 01:09 09/22/2016 05:30 09/22/2016 07:29  Glucose-Capillary Latest Ref Range: 65 - 99 mg/dL 137 (H) 183 (H) 210 (H) 75 249 (H) 304 (H) 296 (H)    Review of Glycemic Control  Current orders for Inpatient glycemic control: Levemir 15 units daily, Novolog 0-9 units Q4H  Inpatient Diabetes Program Recommendations: Insulin - Basal: Noted Levemir was not given yesterday and as a result glucose 296 mg/dl this morning. Do not recommend any changes with Levemir dose at this time. Insulin - Tube Feeding Coverage: If tube feeding continued, please consider ordering Novolog 3 units Q4H while tube feeding infusing.  Thanks, Barnie Alderman, RN, MSN, CDE Diabetes Coordinator Inpatient Diabetes Program [phone] (Team Pager from 8am to 5pm)

## 2016-09-22 NOTE — Progress Notes (Addendum)
Occupational Therapy Treatment Patient Details Name: Shannon [name] MRN: [id number] DOB: 1923/04/29 Today's Date: 09/22/2016    History of present illness Pt is a 83 y.o. female who presented via EMS after several days of worsening cough and shortness of breath on 09/14/2016. She was seen several days prior in the ED. She was noted in the ED to have gangrene of 2nd toe of L foot with purulent and foul smeeling discharge. She was transferred from [facility] to [facility] on 09/15/2016. s/p angioplasty  of L SFA/popliteal artery on 09/21/16. Plan for toe amputation. She has a PMH significant for anemia, arthritis, asthma, cradycardia, CAD< chronic diastolic CHF, CKD, complication of blood transfusion, COPD, depression, DVT, gastric stenosis, GERD, history of blood transfusion, hyperlipidemia, hypertension, hypothyroidism, paraesophageal hernia, perforated stroke, type II diabetes mellitus, and ventral hernia with bowel obstruction.    OT comments  Pt sleeping up in chair, but readily agreeable to work with OT as she states she wants to get stronger. Pt performed toileting and standing grooming and walked throughout her room with min guard assist. She moves slowly and reports feeling sleepy today. 02 sats 94-96% on RA with mobility. Will continue to follow.  Follow Up Recommendations  Home health OT;Supervision/Assistance - 24 hour    Equipment Recommendations  None recommended by OT    Recommendations for Other Services      Precautions / Restrictions Precautions Precautions: Fall Precaution Comments: gangrene on toe of L foot, amputation planned for next week Restrictions Weight Bearing Restrictions: No       Mobility Bed Mobility      General bed mobility comments: pt in chair  Transfers Overall transfer level: Needs assistance Equipment used: Rolling walker (2 wheeled) Transfers: Sit to/from Stand Sit to Stand: Min guard         General transfer comment: increased  time, cues to scoot out to edge, min guard for safety    Balance Overall balance assessment: Needs assistance Sitting-balance support: Feet supported;No upper extremity supported Sitting balance-Leahy Scale: Good     Standing balance support: Bilateral upper extremity supported Standing balance-Leahy Scale: Poor Standing balance comment: Pt requiring assist to maintain balance when she blew her nose in standing with one hand, posterior lean                           ADL either performed or assessed with clinical judgement   ADL       Grooming: Min guard;Standing;Wash/dry Electrical engineer Transfer: Min guard;Ambulation;RW;BSC   Toileting- Architect and Hygiene: Min guard;Sit to/from stand       Functional mobility during ADLs: Min guard General ADL Comments: Pt ambulated to bathroom and sink and returned to her chair with 02 sats remaining in mid 90s on RA.     Vision   Vision Assessment?: No apparent visual deficits   Perception     Praxis      Cognition Arousal/Alertness: Awake/alert Behavior During Therapy: WFL for tasks assessed/performed Overall Cognitive Status: Within Functional Limits for tasks assessed                                 General Comments: Likely baseline        Exercises     Shoulder Instructions  General Comments      Pertinent Vitals/ Pain       Pain Assessment: No/denies pain  Home Living                                          Prior Functioning/Environment              Frequency  Min 2X/week        Progress Toward Goals  OT Goals(current goals can now be found in the care plan section)  Progress towards OT goals: Progressing toward goals  Acute Rehab OT Goals Patient Stated Goal: to go home at discharge.  OT Goal Formulation: With patient Time For Goal Achievement: 10/03/16 Potential to Achieve Goals: Good  Plan Discharge plan  remains appropriate    Co-evaluation                 AM-PAC PT "6 Clicks" Daily Activity     Outcome Measure   Help from another person eating meals?: A Little Help from another person taking care of personal grooming?: A Little Help from another person toileting, which includes using toliet, bedpan, or urinal?: A Little Help from another person bathing (including washing, rinsing, drying)?: A Lot Help from another person to put on and taking off regular upper body clothing?: A Little Help from another person to put on and taking off regular lower body clothing?: A Lot 6 Click Score: 16    End of Session Equipment Utilized During Treatment: Gait belt;Rolling walker  OT Visit Diagnosis: Other abnormalities of gait and mobility (R26.89);Pain;Muscle weakness (generalized) (M62.81)   Activity Tolerance Patient tolerated treatment well   Patient Left in chair;with call bell/phone within reach   Nurse Communication Other (comment) (02 sats with ambulation)        Time: 1478-2956 OT Time Calculation (min): 28 min  Charges: OT General Charges $OT Visit: 1 Procedure OT Treatments $Self Care/Home Management : 23-37 mins    Evern Bio 09/22/2016, 2:38 PM  [phone]

## 2016-09-22 NOTE — Progress Notes (Signed)
Physical Therapy Treatment Patient Details Name: Shannon [name] MRN: [id number] DOB: 05/05/1923 Today's Date: 09/22/2016    History of Present Illness Pt is a 83 y.o. female who presented via EMS after several days of worsening cough and shortness of breath on 09/14/2016. She was seen several days prior in the ED. She was noted in the ED to have gangrene of 2nd toe of L foot with purulent and foul smeeling discharge. She was [facility] from [facility] to [facility] on 09/15/2016. Pt being followed by vascular for possible amputation. She has a PMH significant for anemia, arthritis, asthma, cradycardia, CAD< chronic diastolic CHF, CKD, complication of blood transfusion, COPD, depression, DVT, gastric stenosis, GERD, history of blood transfusion, hyperlipidemia, hypertension, hypothyroidism, paraesophageal hernia, perforated stroke, type II diabetes mellitus, and ventral hernia with bowel obstruction.     PT Comments    Pt is making steady progress with skilled therapy this session. Performed bed mobs with decreased assistance and short distance gait with slow cadence. Pt is able to tolerate being on RA for short distance gait this session. Pt's daughter is present throughout session and reports she will able to take her home.     Follow Up Recommendations  Home health PT;Supervision/Assistance - 24 hour     Equipment Recommendations  None recommended by PT    Recommendations for Other Services       Precautions / Restrictions Precautions Precautions: Fall Precaution Comments: gangrene on toe of L foot. Restrictions Weight Bearing Restrictions: No    Mobility  Bed Mobility Overal bed mobility: Needs Assistance Bed Mobility: Supine to Sit     Supine to sit: Min guard     General bed mobility comments: Min guard for safety  Transfers Overall transfer level: Needs assistance Equipment used: Rolling walker (2 wheeled) Transfers: Sit to/from Stand Sit to Stand: Min  assist         General transfer comment: Min A from EOB  Ambulation/Gait Ambulation/Gait assistance: Min assist;+2 safety/equipment Ambulation Distance (Feet): 20 Feet Assistive device: Rolling walker (2 wheeled) Gait Pattern/deviations: Step-through pattern;Shuffle Gait velocity: Decreased Gait velocity interpretation: Below normal speed for age/gender General Gait Details: Min A to support trunk from EOB around to recliner. Ambulated without O2 in place as this is not used at baseline. Sats 88% after gait, O2 placed back and increased to 95%   Stairs            Wheelchair Mobility    Modified Rankin (Stroke Patients Only)       Balance Overall balance assessment: Needs assistance Sitting-balance support: Feet supported;No upper extremity supported Sitting balance-Leahy Scale: Good     Standing balance support: Bilateral upper extremity supported Standing balance-Leahy Scale: Poor Standing balance comment: Requires UE support for dynamic standing activity.                             Cognition Arousal/Alertness: Awake/alert Behavior During Therapy: WFL for tasks assessed/performed Overall Cognitive Status: Within Functional Limits for tasks assessed                                 General Comments: Likely baseline      Exercises      General Comments        Pertinent Vitals/Pain Pain Assessment: No/denies pain    Home Living  Prior Function            PT Goals (current goals can now be found in the care plan section) Acute Rehab PT Goals Patient Stated Goal: to go home at discharge.  Progress towards PT goals: Progressing toward goals    Frequency    Min 3X/week      PT Plan Current plan remains appropriate    Co-evaluation              AM-PAC PT "6 Clicks" Daily Activity  Outcome Measure  Difficulty turning over in bed (including adjusting bedclothes, sheets and  blankets)?: Total Difficulty moving from lying on back to sitting on the side of the bed? : Total Difficulty sitting down on and standing up from a chair with arms (e.g., wheelchair, bedside commode, etc,.)?: Total Help needed moving to and from a bed to chair (including a wheelchair)?: A Little Help needed walking in hospital room?: A Little Help needed climbing 3-5 steps with a railing? : A Lot 6 Click Score: 11    End of Session Equipment Utilized During Treatment: Gait belt Activity Tolerance: Patient tolerated treatment well Patient left: in chair;with call bell/phone within reach;with chair alarm set;with family/visitor present Nurse Communication: Mobility status PT Visit Diagnosis: Unsteadiness on feet (R26.81);Muscle weakness (generalized) (M62.81)     Time: 2841-3244 PT Time Calculation (min) (ACUTE ONLY): 23 min  Charges:  $Gait Training: 8-22 mins $Therapeutic Activity: 8-22 mins                    G Codes:       Colin Broach PT, DPT  [phone]    Ruel Favors Aletha Halim 09/22/2016, 12:50 PM

## 2016-09-22 NOTE — Progress Notes (Addendum)
PROGRESS NOTE    Shannon [name]  NFA:[id number] DOB: [birth year] DOA: 09/14/2016 PCP: Kirt Boys, DO  Outpatient Specialists:     Brief Narrative:   69 fem CKD III Afib CHad2vasc2 = 6 + Tachybradysyndrome, on coumadin COPD DVT in past CVA Chr L Cerebellear infracts HTN ty II DM GO Obstrution s/p G tube HLD GERD Diastolic HF hypothryoid Pressure ulcer  This is her 8th recent admit She has been offered pallaitive care in the past as her condition is the sum of her mutiple medical issues issues  prior unwilling to undergo amoputation and wishes to try medication only but now has recanted and is willing for surgery Anxiety has affected her care   Assessment & Plan:   Principal Problem:   COPD exacerbation (HCC) Active Problems:   Diabetes mellitus type 2 in nonobese (HCC)   Chronic kidney insufficiency, stage 3 (moderate)   Accelerated hypertension   Hyponatremia   Chronic diastolic CHF (congestive heart failure) (HCC)   Atherosclerosis of native arteries of the extremities with gangrene (HCC)   Gangrene of toe of left foot (HCC)  Respiratory Acute hypoxemic respiratory failure--not on Home O2 COPD exacerbation Left-sided pleural effusion-tiny and non-amenable to [facility] 6/17  ABG 6/17 on venturi mask 50% shows alkalosis--?anxious--add roxanol 5 q 4 prn SOB  weaned steroids IV to prednisone 40mg  6/18 and tapered off 6/20  Now on 2 Liters Oxygen post angio and improved-Sats above 90.    Rn aware Re: Incentive spirometry, ambulate for desat screen   2 vw CXR 6/21 shows atelectasis--has no fever--not overly concerned re: PNA  albuterol 2.5 q2 prn, Flonase 50 g bid-added Dulera 6/18--Azithromycin  6/18 stopped  Pleuritic CP 6/19  EKG: normal EKG, normal sinus rhythm, normal sinus rhythm, qrs AXIS 0, PR 0.08  Troponin grey zone and reassuring  L foot gangrene Prior DVT Leukocytosis 2/2 de margination from IV solu-medrol initially used Has ischemic toe.    No  need abx currently-- demarcating dry gangrene  Angio s/p stent 6/21-needs plavix per [facility] for 3 months at least ending 12/22/16  INR 1.2  Lovenox for now-Toe amputation scheduled early next week  careful with post angio pain control-already on roxanol for WOB  Afib CHad2 Vasc2= 6 Tachybrady Mod ctrll HTN Diastolic CHF EF 60-65% CAD  Cont Coreg 6.25-->12.5 on 6/20, IMdur 30 xl  Continue Prn hydralazine 25  Lasix stopped 6/18  Lovenox   GO Obst 09/2015 GERd  Cont Tube feeds   Can take PO liquids--cont free water 230 ml bid   Cont MirLAX  Dm tY ii C/B NEPHRO/NEUROPATHY Mild DKA resolved quickly 6/18-6/29 and insulin GTT d/c 6/19 am  [no long acting at home]. levemir 12 U--15 U 6/20 am  Put back on Ac coverage, sugars 280-300 6/22--will adjust in am  Cont gabapentin 600 tid--monitor bmet  CKD stg III   basleine GFR anywhere 25-40  Off lasix since 6/18  CVA c Chr Infarcts   hypothryoid  Cont synthroid 137 mcg  TSH in 3 week  Depression, situational  Cont sertraline 100 qd  Global  Asked Hospice/[facility] [place] to peripherally follow   Patient may change mind re amputation-then will need comfort care   SCD and coumadin Partial code Discussed in detail with daughter bedsdie Unclear when d/c--will likely need SNF   Consultants:   vasc  Procedures:   None   Antimicrobials:   Cefepime and flagyll 6/14-->6/17  azithro 6/14-->6/18    Subjective:  Fair Some sob overnight Pain in  toe Ready for amputation if needed next week   Objective: Vitals:   09/22/16 0400 09/22/16 0700 09/22/16 0822 09/22/16 1100  BP: (!) 171/41 (!) 150/65  (!) 168/60  Pulse: 61 63  67  Resp: 19 (!) 24  (!) 26  Temp:  97.9 F (36.6 C)  97.6 F (36.4 C)  TempSrc:  Oral  Oral  SpO2: 100% 100% 100% 99%  Weight:      Height:        Intake/Output Summary (Last 24 hours) at 09/22/16 1159 Last data filed at 09/22/16 0106  Gross per 24 hour  Intake           121.83 ml  Output               550 ml  Net          -428.17 ml   Filed Weights   09/15/16 0212 09/20/16 2141 09/22/16 0106  Weight: 63.6 kg (140 lb 3.4 oz) 60.8 kg (134 lb) 65 kg (143 lb 4.8 oz)    Examination:  eomi ncat, no JVD Pleasant No pallor no ict cta b s1 s 2no m/r/g abd flat-post-op changes, PEG in place L 2nd toe nwrapped but not oozingtoday   Data Reviewed: I have personally reviewed following labs and imaging studies  CBC:  Recent Labs Lab 09/19/16 0259 09/19/16 1104 09/20/16 0608 09/21/16 0504 09/22/16 0409  WBC 20.0* 18.1* 16.8* 16.7* 11.4*  NEUTROABS 16.1* 15.9*  --   --   --   HGB 10.1* 9.8* 10.5* 10.0* 10.0*  HCT 31.2* 30.4* 31.7* 30.9* 31.5*  MCV 89.4 88.4 88.5 88.5 89.5  PLT 378 361 375 400 389   Basic Metabolic Panel:  Recent Labs Lab 09/19/16 0259 09/19/16 0715 09/19/16 1104 09/20/16 0608 09/22/16 0409  NA 137 136 136 140 141  K 4.0 4.0 4.0 3.8 4.1  CL 103 102 103 104 106  CO2 26 28 25 28 29   GLUCOSE 341* 387* 415* 358* 298*  BUN 54* 53* 50* 54* 43*  CREATININE 1.14* 1.12* 1.06* 1.05* 0.99  CALCIUM 9.0 9.0 8.9 9.2 8.7*   GFR: Estimated Creatinine Clearance: 34.6 mL/min (by C-G formula based on SCr of 0.99 mg/dL). Liver Function Tests:  Recent Labs Lab 09/19/16 0259 09/20/16 0608 09/22/16 0409  AST 20 19 30   ALT 16 16 25   ALKPHOS 62 75 61  BILITOT 0.3 0.4 0.4  PROT 6.4* 6.5 5.9*  ALBUMIN 2.5* 2.4* 2.2*   No results for input(s): LIPASE, AMYLASE in the last 168 hours. No results for input(s): AMMONIA in the last 168 hours. Coagulation Profile:  Recent Labs Lab 09/18/16 0655 09/19/16 0259 09/20/16 0608 09/21/16 0504 09/22/16 0409  INR 1.81 1.83 1.52 1.41 1.21   Cardiac Enzymes:  Recent Labs Lab 09/15/16 1445 09/19/16 1104  TROPONINI <0.03 0.03*   BNP (last 3 results)  Recent Labs  05/08/16 1053  PROBNP 1,132*   HbA1C: No results for input(s): HGBA1C in the last 72 hours. CBG:  Recent Labs Lab 09/21/16 2110 09/22/16 0109  09/22/16 0530 09/22/16 0729 09/22/16 1110  GLUCAP 75 249* 304* 296* 286*   Lipid Profile: No results for input(s): CHOL, HDL, LDLCALC, TRIG, CHOLHDL, LDLDIRECT in the last 72 hours. Thyroid Function Tests: No results for input(s): TSH, T4TOTAL, FREET4, T3FREE, THYROIDAB in the last 72 hours. Anemia Panel: No results for input(s): VITAMINB12, FOLATE, FERRITIN, TIBC, IRON, RETICCTPCT in the last 72 hours. Urine analysis:    Component Value Date/Time   COLORURINE  YELLOW 06/08/2016 1647   APPEARANCEUR CLEAR 06/08/2016 1647   LABSPEC 1.006 06/08/2016 1647   PHURINE 8.0 06/08/2016 1647   GLUCOSEU NEGATIVE 06/08/2016 1647   HGBUR NEGATIVE 06/08/2016 1647   BILIRUBINUR NEGATIVE 06/08/2016 1647   KETONESUR NEGATIVE 06/08/2016 1647   PROTEINUR NEGATIVE 06/08/2016 1647   UROBILINOGEN 0.2 01/27/2015 2308   NITRITE NEGATIVE 06/08/2016 1647   LEUKOCYTESUR NEGATIVE 06/08/2016 1647   Sepsis Labs: @LABRCNTIP (procalcitonin:4,lacticidven:4)  ) Recent Results (from the past 240 hour(s))  Culture, blood (Routine X 2) w Reflex to ID Panel     Status: Abnormal   Collection Time: 09/14/16  9:48 PM  Result Value Ref Range Status   Specimen Description BLOOD RIGHT ANTECUBITAL  Final   Special Requests   Final    BOTTLES DRAWN AEROBIC AND ANAEROBIC Blood Culture adequate volume   Culture  Setup Time   Final    GRAM POSITIVE COCCI IN CLUSTERS AEROBIC BOTTLE ONLY CRITICAL RESULT CALLED TO, READ BACK BY AND VERIFIED WITH: K.DUPREE,RN AT 9562 ON 09/18/16 BY [email or web address]    Culture (A)  Final    MICROCOCCUS SPECIES Standardized susceptibility testing for this organism is not available. Performed at [facility], [address], [place], [place] [address]    Report Status 09/19/2016 FINAL  Final  Blood Culture ID Panel (Reflexed)     Status: None   Collection Time: 09/14/16  9:48 PM  Result Value Ref Range Status   Enterococcus species NOT DETECTED NOT DETECTED Final   Listeria monocytogenes  NOT DETECTED NOT DETECTED Final   Staphylococcus species NOT DETECTED NOT DETECTED Final   Staphylococcus aureus NOT DETECTED NOT DETECTED Final   Streptococcus species NOT DETECTED NOT DETECTED Final   Streptococcus agalactiae NOT DETECTED NOT DETECTED Final   Streptococcus pneumoniae NOT DETECTED NOT DETECTED Final   Streptococcus pyogenes NOT DETECTED NOT DETECTED Final   Acinetobacter baumannii NOT DETECTED NOT DETECTED Final   Enterobacteriaceae species NOT DETECTED NOT DETECTED Final   Enterobacter cloacae complex NOT DETECTED NOT DETECTED Final   Escherichia coli NOT DETECTED NOT DETECTED Final   Klebsiella oxytoca NOT DETECTED NOT DETECTED Final   Klebsiella pneumoniae NOT DETECTED NOT DETECTED Final   Proteus species NOT DETECTED NOT DETECTED Final   Serratia marcescens NOT DETECTED NOT DETECTED Final   Haemophilus influenzae NOT DETECTED NOT DETECTED Final   Neisseria meningitidis NOT DETECTED NOT DETECTED Final   Pseudomonas aeruginosa NOT DETECTED NOT DETECTED Final   Candida albicans NOT DETECTED NOT DETECTED Final   Candida glabrata NOT DETECTED NOT DETECTED Final   Candida krusei NOT DETECTED NOT DETECTED Final   Candida parapsilosis NOT DETECTED NOT DETECTED Final   Candida tropicalis NOT DETECTED NOT DETECTED Final    Comment: Performed at [facility], [address] [place], [place] [address]  Culture, blood (Routine X 2) w Reflex to ID Panel     Status: None   Collection Time: 09/14/16 11:25 PM  Result Value Ref Range Status   Specimen Description BLOOD BLOOD LEFT HAND  Final   Special Requests   Final    BOTTLES DRAWN AEROBIC AND ANAEROBIC Blood Culture adequate volume   Culture   Final    NO GROWTH 5 DAYS Performed at [facility], [address] [place], [place] [address]    Report Status 09/20/2016 FINAL  Final  MRSA PCR Screening     Status: None   Collection Time: 09/15/16  5:16 PM  Result Value Ref Range  Status   MRSA by PCR NEGATIVE  NEGATIVE Final    Comment:        The GeneXpert MRSA Assay (FDA approved for NASAL specimens only), is one component of a comprehensive MRSA colonization surveillance program. It is not intended to diagnose MRSA infection nor to guide or monitor treatment for MRSA infections.      Radiology Studies: Dg Chest 2 View  Result Date: 09/21/2016 CLINICAL DATA:  Cough, shortness of breath today, history of pneumonia EXAM: CHEST  2 VIEW COMPARISON:  Chest x-ray of 09/17/2016 and 09/14/2016 FINDINGS: There has been improvement in opacity at the left lung base with a small left pleural effusion and mild left basilar atelectasis remaining. Vague opacity remains anteriorly on the lateral view probably in the right upper lobe anteriorly consistent with linear atelectasis or pneumonia. Cardiomegaly is stable. No acute bony abnormality is seen. IMPRESSION: 1. Improved aeration mild left basilar atelectasis and possibly small left effusion. 2. Mild linear atelectasis or residual pneumonia in the anterior right upper lobe. Electronically Signed   By: Dwyane Dee M.D.   On: 09/21/2016 08:01     Scheduled Meds: . atorvastatin  10 mg Oral q1800  . calcium-vitamin D  1 tablet Oral Q breakfast  . carvedilol  12.5 mg Oral BID  . clopidogrel  75 mg Oral Q breakfast  . cycloSPORINE  1 drop Both Eyes BID  . enoxaparin (LOVENOX) injection  1 mg/kg Subcutaneous Q24H  . feeding supplement (NEPRO CARB STEADY)  237 mL Oral BID BM  . feeding supplement (PRO-STAT SUGAR FREE 64)  30 mL Per Tube BID  . fluticasone  1 spray Each Nare BID  . free water  230 mL Per Tube BID  . gabapentin  600 mg Oral TID  . insulin aspart  0-9 Units Subcutaneous Q4H  . insulin detemir  15 Units Subcutaneous Daily  . ipratropium-albuterol  3 mL Nebulization TID  . isosorbide mononitrate  30 mg Oral Daily  . latanoprost  1 drop Both Eyes QHS  . levothyroxine  137 mcg Oral QAC breakfast  . mometasone-formoterol  2 puff Inhalation BID    . multivitamin with minerals  1 tablet Oral Daily  . nystatin   Topical TID  . polyvinyl alcohol  1 drop Both Eyes QID  . sertraline  100 mg Oral Daily   Continuous Infusions: . feeding supplement (OSMOLITE 1.5 CAL) 1,000 mL (09/21/16 2126)     LOS: 7 days    Time spent: 61    Pleas Koch, MD [facility] ([facility]   If 7PM-7AM, please contact night-coverage [email or web address] Password [facility] 09/22/2016, 11:59 AM

## 2016-09-22 NOTE — Progress Notes (Signed)
  Progress Note    09/22/2016 10:08 AM 1 Day Post-Op  Subjective:  Continued pain in left toes  Vitals:   09/22/16 0400 09/22/16 0700  BP: (!) 171/41 (!) 150/65  Pulse: 61 63  Resp: 19 (!) 24  Temp:  97.9 F (36.6 C)    Physical Exam: aaox3 Abdomen is soft Right groin soft, no hematoma Monophasic AT signal at ankle.   CBC    Component Value Date/Time   WBC 11.4 (H) 09/22/2016 0409   RBC 3.52 (L) 09/22/2016 0409   HGB 10.0 (L) 09/22/2016 0409   HGB 10.5 (L) 06/27/2016 1054   HCT 31.5 (L) 09/22/2016 0409   HCT 32.0 (L) 06/27/2016 1054   PLT 389 09/22/2016 0409   PLT 368 06/27/2016 1054   MCV 89.5 09/22/2016 0409   MCV 89 06/27/2016 1054   MCH 28.4 09/22/2016 0409   MCHC 31.7 09/22/2016 0409   RDW 18.2 (H) 09/22/2016 0409   RDW 21.1 (H) 06/27/2016 1054   LYMPHSABS 1.3 09/19/2016 1104   LYMPHSABS 1.2 10/06/2015 1151   MONOABS 0.9 09/19/2016 1104   EOSABS 0.0 09/19/2016 1104   EOSABS 2.4 (H) 10/06/2015 1151   BASOSABS 0.0 09/19/2016 1104   BASOSABS 0.1 10/06/2015 1151    BMET    Component Value Date/Time   NA 141 09/22/2016 0409   NA 133 (A) 06/30/2016   K 4.1 09/22/2016 0409   CL 106 09/22/2016 0409   CO2 29 09/22/2016 0409   GLUCOSE 298 (H) 09/22/2016 0409   BUN 43 (H) 09/22/2016 0409   BUN 59 (A) 06/30/2016   CREATININE 0.99 09/22/2016 0409   CREATININE 1.20 (H) 08/07/2016 1424   CALCIUM 8.7 (L) 09/22/2016 0409   GFRNONAA 48 (L) 09/22/2016 0409   GFRNONAA 40 (L) 08/07/2016 1424   GFRAA 56 (L) 09/22/2016 0409   GFRAA 46 (L) 08/07/2016 1424    INR    Component Value Date/Time   INR 1.21 09/22/2016 0409     Intake/Output Summary (Last 24 hours) at 09/22/16 1008 Last data filed at 09/22/16 0106  Gross per 24 hour  Intake           121.83 ml  Output              550 ml  Net          -428.17 ml     Assessment:  83 y.o. female is s/p dcb angioplasty of left SFA/popliteal arteries for gangrene of toes  Plan: Discussed with Dr. Donnetta Hutching. Will  plan amputation of left 2nd toe early next week. Continue lovenox while holding coumadin. plavix for dcb x 3 months then can be baby aspirin.    Brandon C. Donzetta Matters, MD [facility] Office: [phone] Pager: [phone]  09/22/2016 10:08 AM

## 2016-09-23 LAB — PROTIME-INR
INR: 1.13
Prothrombin Time: 14.6 seconds (ref 11.4–15.2)

## 2016-09-23 LAB — CBC
HCT: 28.3 % — ABNORMAL LOW (ref 36.0–46.0)
Hemoglobin: 9 g/dL — ABNORMAL LOW (ref 12.0–15.0)
MCH: 28.2 pg (ref 26.0–34.0)
MCHC: 31.8 g/dL (ref 30.0–36.0)
MCV: 88.7 fL (ref 78.0–100.0)
Platelets: 352 10*3/uL (ref 150–400)
RBC: 3.19 MIL/uL — ABNORMAL LOW (ref 3.87–5.11)
RDW: 18 % — ABNORMAL HIGH (ref 11.5–15.5)
WBC: 14.3 10*3/uL — ABNORMAL HIGH (ref 4.0–10.5)

## 2016-09-23 LAB — GLUCOSE, CAPILLARY
Glucose-Capillary: 281 mg/dL — ABNORMAL HIGH (ref 65–99)
Glucose-Capillary: 241 mg/dL — ABNORMAL HIGH (ref 65–99)
Glucose-Capillary: 186 mg/dL — ABNORMAL HIGH (ref 65–99)
Glucose-Capillary: 134 mg/dL — ABNORMAL HIGH (ref 65–99)

## 2016-09-23 MED ORDER — INSULIN GLARGINE 100 UNIT/ML ~~LOC~~ SOLN
18.0000 [IU] | Freq: Every day | SUBCUTANEOUS | Status: DC
  Administered 2016-09-23 – 2016-09-27 (×5): 18 [IU] via SUBCUTANEOUS
  Filled 2016-09-23 (×5): qty 0.18

## 2016-09-23 MED ORDER — INSULIN DETEMIR 100 UNIT/ML ~~LOC~~ SOLN
18.0000 [IU] | Freq: Every day | SUBCUTANEOUS | Status: DC
  Filled 2016-09-23: qty 0.18

## 2016-09-23 NOTE — Progress Notes (Signed)
PROGRESS NOTE    Shannon [name]  ZOX:[id number] DOB: [birth year] DOA: 09/14/2016 PCP: Kirt Boys, DO  Outpatient Specialists:     Brief Narrative:   52 fem CKD III Afib CHad2vasc2 = 6 + Tachybradysyndrome, on coumadin COPD DVT in past CVA Chr L Cerebellear infracts HTN ty II DM GO Obstrution s/p G tube HLD GERD Diastolic HF hypothryoid Pressure ulcer  This is her 8th recent admit She has been offered pallaitive care in the past as her condition is the sum of her mutiple medical issues issues  prior unwilling to undergo amoputation and wishes to try medication only but now has recanted and is willing for surgery Anxiety has affected her care Patient has stabilized and is scheduled for amputation of toe 6/25 and her vascular surgeon   Assessment & Plan:   Principal Problem:   COPD exacerbation (HCC) Active Problems:   Diabetes mellitus type 2 in nonobese (HCC)   Chronic kidney insufficiency, stage 3 (moderate)   Accelerated hypertension   Hyponatremia   Chronic diastolic CHF (congestive heart failure) (HCC)   Atherosclerosis of native arteries of the extremities with gangrene (HCC)   Gangrene of toe of left foot (HCC)  Respiratory Acute hypoxemic respiratory failure--not on Home O2 COPD exacerbation Left-sided pleural effusion-tiny and non-amenable to [facility] 6/17  ABG 6/17 on venturi mask 50% shows alkalosis--?anxious--add roxanol 5 q 4 prn SOB  weaned steroids IV to prednisone 40mg  6/18 and tapered off 6/20  2 vw CXR 6/21 shows atelectasis--has no fever--not overly concerned re: PNA  albuterol 2.5 q2 prn, Flonase 50 g bid-added Dulera 6/18--Azithromycin  6/18 stopped  Wean oxygen tolerated, seems like she will need it with ambulation has went to 88% with therapy on 6/22  Transferred back to telemetry 6/23  Pleuritic CP 6/19  EKG: normal EKG, normal sinus rhythm, normal sinus rhythm, qrs AXIS 0, PR 0.08  Troponin grey zone and reassuring  L foot  gangrene Prior DVT Leukocytosis 2/2 de margination from IV solu-medrol initially used Has ischemic toe.    No need abx currently-- demarcating dry gangrene  Angio s/p stent 6/21-needs plavix per [facility] for 3 months at least ending 12/22/16  INR 1.2  Lovenox for now-Toe amputation confirmed under vascular 09/25/2016  careful with post angio pain control-already on roxanol for WOB  Afib CHad2 Vasc2= 6he is in Tachybrady Mod ctrll HTN Diastolic CHF EF 60-65% CAD  Cont Coreg 6.25-->12.5 on 6/20, IMdur 30 xl  Continue Prn hydralazine 25  Lasix stopped 6/18  Lovenox   GO Obst 09/2015 GERd  Cont Tube feeds   Can take PO liquids--cont free water 230 ml bid   Cont MirLAX  Dm tY ii C/B NEPHRO/NEUROPATHY Mild DKA resolved quickly 6/18-6/29 and insulin GTT d/c 6/19 am  [no long acting at home]. levemir 12 U--15 U changed to Lantus 15 units given low CBG p.m., high CBG a.m.  Put back on Ac coverage, sugars 121-286  Cont gabapentin 600 tid--monitor bmet in a.m. 6/24  CKD stg III   basleine GFR anywhere 25-40  Off lasix since 6/18  CVA c Chr Infarcts   hypothryoid  Cont synthroid 137 mcg  TSH in 3 week  Depression, situational  Cont sertraline 100 qd  Global  Asked Hospice/[facility] [place] to peripherally follow   Patient may change mind re amputation-then will need comfort care   SCD and coumadin Partial code Discussed in detail with daughter on telephone 6/23 Unclear when d/c--will likely need SNF   Consultants:  vasc  Procedures:   None   Antimicrobials:   Cefepime and flagyll 6/14-->6/17  azithro 6/14-->6/18    Subjective:  Awake alert pleasant oriented Plate is clean she ate all of her breakfast Her pain is reasonably controlled She has no nausea, no vomiting, no chest pain, no bleeding, no blurred vision, no double vision She ambulated with therapy but dropped her oxygen sats to 80% She seems very much improved from prior   Objective: Vitals:   09/23/16  0130 09/23/16 0727 09/23/16 0737 09/23/16 0930  BP: (!) 154/49 (!) 150/63  (!) 146/46  Pulse: 70 73  73  Resp: 18 (!) 29  (!) 24  Temp: 97.3 F (36.3 C) 98.6 F (37 C)  99 F (37.2 C)  TempSrc: Oral Oral  Oral  SpO2: 98% 94% 95% 98%  Weight: 68.6 kg (151 lb 3.8 oz)     Height:        Intake/Output Summary (Last 24 hours) at 09/23/16 1002 Last data filed at 09/23/16 0800  Gross per 24 hour  Intake          2127.25 ml  Output               50 ml  Net          2077.25 ml   Filed Weights   09/20/16 2141 09/22/16 0106 09/23/16 0130  Weight: 60.8 kg (134 lb) 65 kg (143 lb 4.8 oz) 68.6 kg (151 lb 3.8 oz)    Examination:  eomi ncat, no JVD Pleasant No pallor no ict cta b s1 s 2no m/r/g abd flat-post-op changes, PEG in place Did not examine wound today   Data Reviewed: I have personally reviewed following labs and imaging studies  CBC:  Recent Labs Lab 09/19/16 0259 09/19/16 1104 09/20/16 0608 09/21/16 0504 09/22/16 0409 09/23/16 0417  WBC 20.0* 18.1* 16.8* 16.7* 11.4* 14.3*  NEUTROABS 16.1* 15.9*  --   --   --   --   HGB 10.1* 9.8* 10.5* 10.0* 10.0* 9.0*  HCT 31.2* 30.4* 31.7* 30.9* 31.5* 28.3*  MCV 89.4 88.4 88.5 88.5 89.5 88.7  PLT 378 361 375 400 389 352   Basic Metabolic Panel:  Recent Labs Lab 09/19/16 0259 09/19/16 0715 09/19/16 1104 09/20/16 0608 09/22/16 0409  NA 137 136 136 140 141  K 4.0 4.0 4.0 3.8 4.1  CL 103 102 103 104 106  CO2 26 28 25 28 29   GLUCOSE 341* 387* 415* 358* 298*  BUN 54* 53* 50* 54* 43*  CREATININE 1.14* 1.12* 1.06* 1.05* 0.99  CALCIUM 9.0 9.0 8.9 9.2 8.7*   GFR: Estimated Creatinine Clearance: 34.6 mL/min (by C-G formula based on SCr of 0.99 mg/dL). Liver Function Tests:  Recent Labs Lab 09/19/16 0259 09/20/16 0608 09/22/16 0409  AST 20 19 30   ALT 16 16 25   ALKPHOS 62 75 61  BILITOT 0.3 0.4 0.4  PROT 6.4* 6.5 5.9*  ALBUMIN 2.5* 2.4* 2.2*   No results for input(s): LIPASE, AMYLASE in the last 168 hours. No  results for input(s): AMMONIA in the last 168 hours. Coagulation Profile:  Recent Labs Lab 09/19/16 0259 09/20/16 0608 09/21/16 0504 09/22/16 0409 09/23/16 0417  INR 1.83 1.52 1.41 1.21 1.13   Cardiac Enzymes:  Recent Labs Lab 09/19/16 1104  TROPONINI 0.03*   BNP (last 3 results)  Recent Labs  05/08/16 1053  PROBNP 1,132*   HbA1C: No results for input(s): HGBA1C in the last 72 hours. CBG:  Recent Labs Lab  09/22/16 0729 09/22/16 1110 09/22/16 1551 09/22/16 2057 09/23/16 0558  GLUCAP 296* 286* 148* 121* 281*   Lipid Profile: No results for input(s): CHOL, HDL, LDLCALC, TRIG, CHOLHDL, LDLDIRECT in the last 72 hours. Thyroid Function Tests: No results for input(s): TSH, T4TOTAL, FREET4, T3FREE, THYROIDAB in the last 72 hours. Anemia Panel: No results for input(s): VITAMINB12, FOLATE, FERRITIN, TIBC, IRON, RETICCTPCT in the last 72 hours. Urine analysis:    Component Value Date/Time   COLORURINE YELLOW 06/08/2016 1647   APPEARANCEUR CLEAR 06/08/2016 1647   LABSPEC 1.006 06/08/2016 1647   PHURINE 8.0 06/08/2016 1647   GLUCOSEU NEGATIVE 06/08/2016 1647   HGBUR NEGATIVE 06/08/2016 1647   BILIRUBINUR NEGATIVE 06/08/2016 1647   KETONESUR NEGATIVE 06/08/2016 1647   PROTEINUR NEGATIVE 06/08/2016 1647   UROBILINOGEN 0.2 01/27/2015 2308   NITRITE NEGATIVE 06/08/2016 1647   LEUKOCYTESUR NEGATIVE 06/08/2016 1647   Sepsis Labs: @LABRCNTIP (procalcitonin:4,lacticidven:4)  ) Recent Results (from the past 240 hour(s))  Culture, blood (Routine X 2) w Reflex to ID Panel     Status: Abnormal   Collection Time: 09/14/16  9:48 PM  Result Value Ref Range Status   Specimen Description BLOOD RIGHT ANTECUBITAL  Final   Special Requests   Final    BOTTLES DRAWN AEROBIC AND ANAEROBIC Blood Culture adequate volume   Culture  Setup Time   Final    GRAM POSITIVE COCCI IN CLUSTERS AEROBIC BOTTLE ONLY CRITICAL RESULT CALLED TO, READ BACK BY AND VERIFIED WITH: K.DUPREE,RN AT 0865  ON 09/18/16 BY G.[email or web address]    Culture (A)  Final    MICROCOCCUS SPECIES Standardized susceptibility testing for this organism is not available. Performed at [facility], [address] [place], [place] [address]    Report Status 09/19/2016 FINAL  Final  Blood Culture ID Panel (Reflexed)     Status: None   Collection Time: 09/14/16  9:48 PM  Result Value Ref Range Status   Enterococcus species NOT DETECTED NOT DETECTED Final   Listeria monocytogenes NOT DETECTED NOT DETECTED Final   Staphylococcus species NOT DETECTED NOT DETECTED Final   Staphylococcus aureus NOT DETECTED NOT DETECTED Final   Streptococcus species NOT DETECTED NOT DETECTED Final   Streptococcus agalactiae NOT DETECTED NOT DETECTED Final   Streptococcus pneumoniae NOT DETECTED NOT DETECTED Final   Streptococcus pyogenes NOT DETECTED NOT DETECTED Final   Acinetobacter baumannii NOT DETECTED NOT DETECTED Final   Enterobacteriaceae species NOT DETECTED NOT DETECTED Final   Enterobacter cloacae complex NOT DETECTED NOT DETECTED Final   Escherichia coli NOT DETECTED NOT DETECTED Final   Klebsiella oxytoca NOT DETECTED NOT DETECTED Final   Klebsiella pneumoniae NOT DETECTED NOT DETECTED Final   Proteus species NOT DETECTED NOT DETECTED Final   Serratia marcescens NOT DETECTED NOT DETECTED Final   Haemophilus influenzae NOT DETECTED NOT DETECTED Final   Neisseria meningitidis NOT DETECTED NOT DETECTED Final   Pseudomonas aeruginosa NOT DETECTED NOT DETECTED Final   Candida albicans NOT DETECTED NOT DETECTED Final   Candida glabrata NOT DETECTED NOT DETECTED Final   Candida krusei NOT DETECTED NOT DETECTED Final   Candida parapsilosis NOT DETECTED NOT DETECTED Final   Candida tropicalis NOT DETECTED NOT DETECTED Final    Comment: Performed at [facility], [address], [place], [place] [address]  Culture, blood (Routine X 2) w Reflex to ID Panel     Status: None   Collection Time: 09/14/16 11:25 PM  Result  Value Ref Range Status   Specimen Description BLOOD  BLOOD LEFT HAND  Final   Special Requests   Final    BOTTLES DRAWN AEROBIC AND ANAEROBIC Blood Culture adequate volume   Culture   Final    NO GROWTH 5 DAYS Performed at [facility], [address], [place], [place] [address]    Report Status 09/20/2016 FINAL  Final  MRSA PCR Screening     Status: None   Collection Time: 09/15/16  5:16 PM  Result Value Ref Range Status   MRSA by PCR NEGATIVE NEGATIVE Final    Comment:        The GeneXpert MRSA Assay (FDA approved for NASAL specimens only), is one component of a comprehensive MRSA colonization surveillance program. It is not intended to diagnose MRSA infection nor to guide or monitor treatment for MRSA infections.      Radiology Studies: No results found.   Scheduled Meds: . atorvastatin  10 mg Oral q1800  . calcium-vitamin D  1 tablet Oral Q breakfast  . carvedilol  12.5 mg Oral BID  . clopidogrel  75 mg Oral Q breakfast  . cycloSPORINE  1 drop Both Eyes BID  . enoxaparin (LOVENOX) injection  1 mg/kg Subcutaneous Q24H  . feeding supplement (NEPRO CARB STEADY)  237 mL Oral BID BM  . feeding supplement (PRO-STAT SUGAR FREE 64)  30 mL Per Tube BID  . fluticasone  1 spray Each Nare BID  . free water  230 mL Per Tube BID  . gabapentin  600 mg Oral TID  . insulin aspart  0-9 Units Subcutaneous TID WC  . insulin aspart  3 Units Subcutaneous TID WC  . insulin glargine  18 Units Subcutaneous Daily  . ipratropium-albuterol  3 mL Nebulization TID  . isosorbide mononitrate  30 mg Oral Daily  . latanoprost  1 drop Both Eyes QHS  . levothyroxine  137 mcg Oral QAC breakfast  . mometasone-formoterol  2 puff Inhalation BID  . multivitamin with minerals  1 tablet Oral Daily  . nystatin   Topical TID  . polyvinyl alcohol  1 drop Both Eyes QID  . sertraline  100 mg Oral Daily   Continuous Infusions: . feeding supplement (OSMOLITE 1.5 CAL) 1,000 mL (09/23/16 0800)      LOS: 8 days    Time spent: 31    Pleas Koch, MD [facility] ([facility]   If 7PM-7AM, please contact night-coverage [email or web address] Password [email or web address] 09/23/2016, 10:02 AM

## 2016-09-23 NOTE — Progress Notes (Signed)
  Progress Note    09/23/2016 10:31 AM 2 Days Post-Op  Subjective: no new complaints  Vitals:   09/23/16 0727 09/23/16 0930  BP: (!) 150/63 (!) 146/46  Pulse: 73 73  Resp: (!) 29 (!) 24  Temp: 98.6 F (37 C) 99 F (37.2 C)    Physical Exam: aaox3 Left at/pt/peroneal signals strong at ankle Foot dressing is cdi  CBC    Component Value Date/Time   WBC 14.3 (H) 09/23/2016 0417   RBC 3.19 (L) 09/23/2016 0417   HGB 9.0 (L) 09/23/2016 0417   HGB 10.5 (L) 06/27/2016 1054   HCT 28.3 (L) 09/23/2016 0417   HCT 32.0 (L) 06/27/2016 1054   PLT 352 09/23/2016 0417   PLT 368 06/27/2016 1054   MCV 88.7 09/23/2016 0417   MCV 89 06/27/2016 1054   MCH 28.2 09/23/2016 0417   MCHC 31.8 09/23/2016 0417   RDW 18.0 (H) 09/23/2016 0417   RDW 21.1 (H) 06/27/2016 1054   LYMPHSABS 1.3 09/19/2016 1104   LYMPHSABS 1.2 10/06/2015 1151   MONOABS 0.9 09/19/2016 1104   EOSABS 0.0 09/19/2016 1104   EOSABS 2.4 (H) 10/06/2015 1151   BASOSABS 0.0 09/19/2016 1104   BASOSABS 0.1 10/06/2015 1151    BMET    Component Value Date/Time   NA 141 09/22/2016 0409   NA 133 (A) 06/30/2016   K 4.1 09/22/2016 0409   CL 106 09/22/2016 0409   CO2 29 09/22/2016 0409   GLUCOSE 298 (H) 09/22/2016 0409   BUN 43 (H) 09/22/2016 0409   BUN 59 (A) 06/30/2016   CREATININE 0.99 09/22/2016 0409   CREATININE 1.20 (H) 08/07/2016 1424   CALCIUM 8.7 (L) 09/22/2016 0409   GFRNONAA 48 (L) 09/22/2016 0409   GFRNONAA 40 (L) 08/07/2016 1424   GFRAA 56 (L) 09/22/2016 0409   GFRAA 46 (L) 08/07/2016 1424    INR    Component Value Date/Time   INR 1.13 09/23/2016 0417     Intake/Output Summary (Last 24 hours) at 09/23/16 1031 Last data filed at 09/23/16 1012  Gross per 24 hour  Intake          2127.25 ml  Output               50 ml  Net          2077.25 ml     Assessment:  83 y.o. female is dcb of left sfa/popliteal  Plan: plavix and lovenox OR Monday for toe amputation Hold coumadin for  now.   Brandon C. Donzetta Matters, MD [facility] Office: [phone] Pager: [phone]  09/23/2016 10:31 AM

## 2016-09-23 NOTE — Progress Notes (Addendum)
Patient transferred from [facility] to 6E25 this morning  [phone] wheelchair and escorted by RN and NT. SBAR report provided via telephone prior to transport. Patient alert and oriented x 4. 2 person mod assist from chair to bed. Osmolite infusing via peg. Skin assessed by 2 RNs. Noted pink foam to sacrum. Assessed skin beneath foam to be intact. Small skin tear to back of left thigh 1 cm diameter. Dressing to left foot/toes intact. Central telemetry notified of box 25 placed on patient. SR. 2nd verification completed. Patient oriented to room. Call bell in reach. Bed alarm on. Will continue to monitor. Bartholomew Crews, RN

## 2016-09-23 NOTE — Progress Notes (Signed)
Pt with transfer order to 6E25. Report given to [facility]. Pt is A&Ox4. V/S stable. No c/o chest pain or any discomfort. Transferred to 0F75 without complications @ 10:25 AM.

## 2016-09-24 ENCOUNTER — Inpatient Hospital Stay (HOSPITAL_COMMUNITY): Payer: Medicare Other

## 2016-09-24 DIAGNOSIS — I739 Peripheral vascular disease, unspecified: Secondary | ICD-10-CM

## 2016-09-24 LAB — BASIC METABOLIC PANEL
Anion gap: 6 (ref 5–15)
BUN: 38 mg/dL — ABNORMAL HIGH (ref 6–20)
CO2: 26 mmol/L (ref 22–32)
Calcium: 8.5 mg/dL — ABNORMAL LOW (ref 8.9–10.3)
Chloride: 104 mmol/L (ref 101–111)
Creatinine, Ser: 1.07 mg/dL — ABNORMAL HIGH (ref 0.44–1.00)
GFR calc Af Amer: 51 mL/min — ABNORMAL LOW (ref >60)
GFR calc non Af Amer: 44 mL/min — ABNORMAL LOW (ref >60)
Glucose, Bld: 277 mg/dL — ABNORMAL HIGH (ref 65–99)
Potassium: 4.2 mmol/L (ref 3.5–5.1)
Sodium: 136 mmol/L (ref 135–145)

## 2016-09-24 LAB — CBC
HCT: 27.9 % — ABNORMAL LOW (ref 36.0–46.0)
Hemoglobin: 8.9 g/dL — ABNORMAL LOW (ref 12.0–15.0)
MCH: 28.5 pg (ref 26.0–34.0)
MCHC: 31.9 g/dL (ref 30.0–36.0)
MCV: 89.4 fL (ref 78.0–100.0)
Platelets: 314 10*3/uL (ref 150–400)
RBC: 3.12 MIL/uL — ABNORMAL LOW (ref 3.87–5.11)
RDW: 18.2 % — ABNORMAL HIGH (ref 11.5–15.5)
WBC: 15.4 10*3/uL — ABNORMAL HIGH (ref 4.0–10.5)

## 2016-09-24 LAB — GLUCOSE, CAPILLARY
Glucose-Capillary: 303 mg/dL — ABNORMAL HIGH (ref 65–99)
Glucose-Capillary: 181 mg/dL — ABNORMAL HIGH (ref 65–99)
Glucose-Capillary: 154 mg/dL — ABNORMAL HIGH (ref 65–99)
Glucose-Capillary: 119 mg/dL — ABNORMAL HIGH (ref 65–99)

## 2016-09-24 LAB — SURGICAL PCR SCREEN
MRSA, PCR: POSITIVE — AB
Staphylococcus aureus: POSITIVE — AB

## 2016-09-24 LAB — PROTIME-INR
INR: 1.14
Prothrombin Time: 14.7 seconds (ref 11.4–15.2)

## 2016-09-24 MED ORDER — CHLORHEXIDINE GLUCONATE CLOTH 2 % EX PADS
6.0000 | MEDICATED_PAD | Freq: Every day | CUTANEOUS | Status: DC
  Administered 2016-09-25 – 2016-09-27 (×3): 6 via TOPICAL

## 2016-09-24 MED ORDER — VANCOMYCIN HCL IN DEXTROSE 1-5 GM/200ML-% IV SOLN
1000.0000 mg | INTRAVENOUS | Status: AC
  Administered 2016-09-25: 1000 mg via INTRAVENOUS

## 2016-09-24 MED ORDER — VANCOMYCIN HCL IN DEXTROSE 1-5 GM/200ML-% IV SOLN
1000.0000 mg | INTRAVENOUS | Status: DC
  Filled 2016-09-24: qty 200

## 2016-09-24 MED ORDER — MUPIROCIN 2 % EX OINT
1.0000 "application " | TOPICAL_OINTMENT | Freq: Two times a day (BID) | CUTANEOUS | Status: DC
  Administered 2016-09-24 – 2016-09-27 (×7): 1 via NASAL
  Filled 2016-09-24 (×2): qty 22

## 2016-09-24 NOTE — Progress Notes (Signed)
Paged MD 2nd time for order for Type and Screen per Blood Bank request for am surgery. Awaiting response.  Eleanora Neighbor, RN

## 2016-09-24 NOTE — Progress Notes (Signed)
PROGRESS NOTE    Shannon [name]  WUJ:[id number] DOB: 16-Sep-1923 DOA: 09/14/2016 PCP: Kirt Boys, DO  Outpatient Specialists:     Brief Narrative:   89 fem CKD III Afib CHad2vasc2 = 6 + Tachybradysyndrome, on coumadin COPD DVT in past CVA Chr L Cerebellear infracts HTN ty II DM GO Obstrution s/p G tube HLD GERD Diastolic HF hypothryoid Pressure ulcer  This is her 8th recent admit She has been offered pallaitive care in the past as her condition is the sum of her mutiple medical issues issues  prior unwilling to undergo amoputation and wishes to try medication only but now has recanted and is willing for surgery Anxiety has affected her care Patient has stabilized and is scheduled for amputation of toe 6/25 and her vascular surgeon   Assessment & Plan:   Principal Problem:   COPD exacerbation (HCC) Active Problems:   Diabetes mellitus type 2 in nonobese (HCC)   Chronic kidney insufficiency, stage 3 (moderate)   Accelerated hypertension   Hyponatremia   Chronic diastolic CHF (congestive heart failure) (HCC)   Atherosclerosis of native arteries of the extremities with gangrene (HCC)   Gangrene of toe of left foot (HCC)  Respiratory Acute hypoxemic respiratory failure--not on Home O2 COPD exacerbation Left-sided pleural effusion-tiny and non-amenable to [facility] 6/17  ABG 6/17 on venturi mask 50% shows alkalosis--?anxious--add roxanol 5 q 4 prn SOB  weaned steroids IV to prednisone 40mg  6/18 and tapered off 6/20  2 vw CXR 6/21 shows atelectasis--has no fever--not overly concerned re: PNA  albuterol 2.5 q2 prn, Flonase 50 g bid-added Dulera 6/18--Azithromycin  6/18 stopped  Wean oxygen tolerated, seems like she will need it with ambulation has went to 88% with therapy on 6/22  Transferred back to telemetry 6/23  Pleuritic CP 6/19  EKG: normal EKG, normal sinus rhythm, normal sinus rhythm, qrs AXIS 0, PR 0.08  Troponin grey zone and reassuring  L foot  gangrene Prior DVT Leukocytosis 2/2 de margination from IV solu-medrol , component of gangrene as well  Has ischemic toe.  She does not have sepsis  No need abx currently-- demarcating dry gangrene  ABI performed 09/24/16 shows 0.86 R, 0.96L  Angio s/p stent 6/21-needs plavix per [facility] for 3 months at least ending 12/22/16  INR 1.2  Lovenox for now-Toe amputation confirmed under vascular 09/25/2016  careful with post angio pain control-already on roxanol for WOB  Afib CHad2 Vasc2= 6, usually on Coumadin at home Tachybrady Mod ctrll HTN Diastolic CHF EF 60-65% CAD  Cont Coreg 6.25-->12.5 on 6/20, IMdur 30 xl  Continue Prn hydralazine 25  Lasix stopped 6/18  Lovenox continues at full dose until all procedures are complete    GO Obst 09/2015 GERd  Cont Tube feeds daily at bedtime  Can take PO liquids--cont free water 230 ml bid   Cont MirLAX  Dm tY ii C/B NEPHRO/NEUROPATHY Mild DKA resolved quickly 6/18-6/29 and insulin GTT d/c 6/19 am  [no long acting at home]. levemir 12 U--15 U changed to Lantus 15 units given low CBG p.m., high CBG a.m.  Put back on Ac coverage, sugars 121-277  Cont gabapentin 600 tid  Get renal panel 6/25  CKD stg III   basleine GFR anywhere 25-40  Off lasix since 6/18  CVA c Chr Infarcts   hypothryoid  Cont synthroid 137 mcg  TSH in 3 week  Depression, situational  Cont sertraline 100 qd  SCD and coumadin Partial code Discussed in detail with daughter on telephone 6/23,  no family present today Unclear when d/c--will likely need SNF   Consultants:   vasc  Procedures:   None   Antimicrobials:   Cefepime and flagyll 6/14-->6/17  azithro 6/14-->6/18    Subjective:  Awake alert pleasant oriented She is ready for amputation tomorrow She is not having much pain and dressing was changed by vascular this morning according to patient Is having a little bit of soft stool-has been drinking   Objective: Vitals:   09/24/16 0415 09/24/16  0807 09/24/16 0900 09/24/16 0901  BP: (!) 178/54 (!) 175/55    Pulse: 75 66    Resp: 18 18    Temp: 99.3 F (37.4 C) 98.4 F (36.9 C)    TempSrc: Oral Oral    SpO2: 94% 96% 93% 93%  Weight:      Height:        Intake/Output Summary (Last 24 hours) at 09/24/16 1531 Last data filed at 09/24/16 0416  Gross per 24 hour  Intake           867.83 ml  Output                0 ml  Net           867.83 ml   Filed Weights   09/22/16 0106 09/23/16 0130 09/23/16 2018  Weight: 65 kg (143 lb 4.8 oz) 68.6 kg (151 lb 3.8 oz) 64 kg (141 lb)    Examination:  eomi ncat, no JVD Pleasant, no icterus no pallor no bruit No pallor no ict cta b s1 s 2no m/r/g abd flat-post-op changes, PEG in place Wound is wrapped and has been cleaned by vascular surgeon Euthymic pleasant   Data Reviewed: I have personally reviewed following labs and imaging studies  CBC:  Recent Labs Lab 09/19/16 0259 09/19/16 1104 09/20/16 0608 09/21/16 0504 09/22/16 0409 09/23/16 0417 09/24/16 0529  WBC 20.0* 18.1* 16.8* 16.7* 11.4* 14.3* 15.4*  NEUTROABS 16.1* 15.9*  --   --   --   --   --   HGB 10.1* 9.8* 10.5* 10.0* 10.0* 9.0* 8.9*  HCT 31.2* 30.4* 31.7* 30.9* 31.5* 28.3* 27.9*  MCV 89.4 88.4 88.5 88.5 89.5 88.7 89.4  PLT 378 361 375 400 389 352 314   Basic Metabolic Panel:  Recent Labs Lab 09/19/16 0715 09/19/16 1104 09/20/16 0608 09/22/16 0409 09/24/16 0529  NA 136 136 140 141 136  K 4.0 4.0 3.8 4.1 4.2  CL 102 103 104 106 104  CO2 28 25 28 29 26   GLUCOSE 387* 415* 358* 298* 277*  BUN 53* 50* 54* 43* 38*  CREATININE 1.12* 1.06* 1.05* 0.99 1.07*  CALCIUM 9.0 8.9 9.2 8.7* 8.5*   GFR: Estimated Creatinine Clearance: 32.1 mL/min (A) (by C-G formula based on SCr of 1.07 mg/dL (H)). Liver Function Tests:  Recent Labs Lab 09/19/16 0259 09/20/16 0608 09/22/16 0409  AST 20 19 30   ALT 16 16 25   ALKPHOS 62 75 61  BILITOT 0.3 0.4 0.4  PROT 6.4* 6.5 5.9*  ALBUMIN 2.5* 2.4* 2.2*   No results  for input(s): LIPASE, AMYLASE in the last 168 hours. No results for input(s): AMMONIA in the last 168 hours. Coagulation Profile:  Recent Labs Lab 09/20/16 0608 09/21/16 0504 09/22/16 0409 09/23/16 0417 09/24/16 0529  INR 1.52 1.41 1.21 1.13 1.14   Cardiac Enzymes:  Recent Labs Lab 09/19/16 1104  TROPONINI 0.03*   BNP (last 3 results)  Recent Labs  05/08/16 1053  PROBNP 1,132*  HbA1C: No results for input(s): HGBA1C in the last 72 hours. CBG:  Recent Labs Lab 09/23/16 1134 09/23/16 1622 09/23/16 2031 09/24/16 0805 09/24/16 1242  GLUCAP 241* 186* 134* 303* 181*   Lipid Profile: No results for input(s): CHOL, HDL, LDLCALC, TRIG, CHOLHDL, LDLDIRECT in the last 72 hours. Thyroid Function Tests: No results for input(s): TSH, T4TOTAL, FREET4, T3FREE, THYROIDAB in the last 72 hours. Anemia Panel: No results for input(s): VITAMINB12, FOLATE, FERRITIN, TIBC, IRON, RETICCTPCT in the last 72 hours. Urine analysis:    Component Value Date/Time   COLORURINE YELLOW 06/08/2016 1647   APPEARANCEUR CLEAR 06/08/2016 1647   LABSPEC 1.006 06/08/2016 1647   PHURINE 8.0 06/08/2016 1647   GLUCOSEU NEGATIVE 06/08/2016 1647   HGBUR NEGATIVE 06/08/2016 1647   BILIRUBINUR NEGATIVE 06/08/2016 1647   KETONESUR NEGATIVE 06/08/2016 1647   PROTEINUR NEGATIVE 06/08/2016 1647   UROBILINOGEN 0.2 01/27/2015 2308   NITRITE NEGATIVE 06/08/2016 1647   LEUKOCYTESUR NEGATIVE 06/08/2016 1647   Sepsis Labs: @LABRCNTIP (procalcitonin:4,lacticidven:4)  ) Recent Results (from the past 240 hour(s))  Culture, blood (Routine X 2) w Reflex to ID Panel     Status: Abnormal   Collection Time: 09/14/16  9:48 PM  Result Value Ref Range Status   Specimen Description BLOOD RIGHT ANTECUBITAL  Final   Special Requests   Final    BOTTLES DRAWN AEROBIC AND ANAEROBIC Blood Culture adequate volume   Culture  Setup Time   Final    GRAM POSITIVE COCCI IN CLUSTERS AEROBIC BOTTLE ONLY CRITICAL RESULT  CALLED TO, READ BACK BY AND VERIFIED WITH: K.DUPREE,RN AT 3244 ON 09/18/16 BY [email or web address]    Culture (A)  Final    MICROCOCCUS SPECIES Standardized susceptibility testing for this organism is not available. Performed at [facility], [address] [place], [place] [address]    Report Status 09/19/2016 FINAL  Final  Blood Culture ID Panel (Reflexed)     Status: None   Collection Time: 09/14/16  9:48 PM  Result Value Ref Range Status   Enterococcus species NOT DETECTED NOT DETECTED Final   Listeria monocytogenes NOT DETECTED NOT DETECTED Final   Staphylococcus species NOT DETECTED NOT DETECTED Final   Staphylococcus aureus NOT DETECTED NOT DETECTED Final   Streptococcus species NOT DETECTED NOT DETECTED Final   Streptococcus agalactiae NOT DETECTED NOT DETECTED Final   Streptococcus pneumoniae NOT DETECTED NOT DETECTED Final   Streptococcus pyogenes NOT DETECTED NOT DETECTED Final   Acinetobacter baumannii NOT DETECTED NOT DETECTED Final   Enterobacteriaceae species NOT DETECTED NOT DETECTED Final   Enterobacter cloacae complex NOT DETECTED NOT DETECTED Final   Escherichia coli NOT DETECTED NOT DETECTED Final   Klebsiella oxytoca NOT DETECTED NOT DETECTED Final   Klebsiella pneumoniae NOT DETECTED NOT DETECTED Final   Proteus species NOT DETECTED NOT DETECTED Final   Serratia marcescens NOT DETECTED NOT DETECTED Final   Haemophilus influenzae NOT DETECTED NOT DETECTED Final   Neisseria meningitidis NOT DETECTED NOT DETECTED Final   Pseudomonas aeruginosa NOT DETECTED NOT DETECTED Final   Candida albicans NOT DETECTED NOT DETECTED Final   Candida glabrata NOT DETECTED NOT DETECTED Final   Candida krusei NOT DETECTED NOT DETECTED Final   Candida parapsilosis NOT DETECTED NOT DETECTED Final   Candida tropicalis NOT DETECTED NOT DETECTED Final    Comment: Performed at [facility], [address], [place], [place] [address]  Culture, blood (Routine X 2) w Reflex to ID Panel      Status: None  Collection Time: 09/14/16 11:25 PM  Result Value Ref Range Status   Specimen Description BLOOD BLOOD LEFT HAND  Final   Special Requests   Final    BOTTLES DRAWN AEROBIC AND ANAEROBIC Blood Culture adequate volume   Culture   Final    NO GROWTH 5 DAYS Performed at [facility], [address], [place], [place] [address]    Report Status 09/20/2016 FINAL  Final  MRSA PCR Screening     Status: None   Collection Time: 09/15/16  5:16 PM  Result Value Ref Range Status   MRSA by PCR NEGATIVE NEGATIVE Final    Comment:        The GeneXpert MRSA Assay (FDA approved for NASAL specimens only), is one component of a comprehensive MRSA colonization surveillance program. It is not intended to diagnose MRSA infection nor to guide or monitor treatment for MRSA infections.   Surgical PCR screen     Status: Abnormal   Collection Time: 09/24/16 10:02 AM  Result Value Ref Range Status   MRSA, PCR POSITIVE (A) NEGATIVE Final   Staphylococcus aureus POSITIVE (A) NEGATIVE Final    Comment:        The Xpert SA Assay (FDA approved for NASAL specimens in patients over 23 years of age), is one component of a comprehensive surveillance program.  Test performance has been validated by [facility] for patients greater than or equal to 63 year old. It is not intended to diagnose infection nor to guide or monitor treatment. CRITICAL RESULT CALLED TO, READ BACK BY AND VERIFIED WITH: C. HOLTSON, RN AT 1220 ON 09/24/16 BY C. JESSUP, MLT.      Radiology Studies: No results found.   Scheduled Meds: . atorvastatin  10 mg Oral q1800  . calcium-vitamin D  1 tablet Oral Q breakfast  . carvedilol  12.5 mg Oral BID  . [START ON 09/25/2016] Chlorhexidine Gluconate Cloth  6 each Topical Q0600  . clopidogrel  75 mg Oral Q breakfast  . cycloSPORINE  1 drop Both Eyes BID  . enoxaparin (LOVENOX) injection  1 mg/kg Subcutaneous Q24H  . feeding supplement (NEPRO CARB STEADY)  237 mL  Oral BID BM  . feeding supplement (PRO-STAT SUGAR FREE 64)  30 mL Per Tube BID  . fluticasone  1 spray Each Nare BID  . free water  230 mL Per Tube BID  . gabapentin  600 mg Oral TID  . insulin aspart  0-9 Units Subcutaneous TID WC  . insulin aspart  3 Units Subcutaneous TID WC  . insulin glargine  18 Units Subcutaneous Daily  . ipratropium-albuterol  3 mL Nebulization TID  . isosorbide mononitrate  30 mg Oral Daily  . latanoprost  1 drop Both Eyes QHS  . levothyroxine  137 mcg Oral QAC breakfast  . mometasone-formoterol  2 puff Inhalation BID  . multivitamin with minerals  1 tablet Oral Daily  . mupirocin ointment  1 application Nasal BID  . nystatin   Topical TID  . polyvinyl alcohol  1 drop Both Eyes QID  . sertraline  100 mg Oral Daily   Continuous Infusions: . feeding supplement (OSMOLITE 1.5 CAL) 1,000 mL (09/23/16 2218)  . [START ON 09/25/2016] vancomycin       LOS: 9 days    Time spent: 25    Pleas Koch, MD [facility]   If 7PM-7AM, please contact night-coverage [email or web address] Password [facility] 09/24/2016, 3:31 PM

## 2016-09-24 NOTE — Progress Notes (Addendum)
[facility] Progress Note  Subjective    No complaints. Eating breakfast currently.  Objective Vitals:   09/24/16 0415 09/24/16 0807  BP: (!) 178/54 (!) 175/55  Pulse: 75 66  Resp: 18 18  Temp: 99.3 F (37.4 C) 98.4 F (36.9 C)    Intake/Output Summary (Last 24 hours) at 09/24/16 0913 Last data filed at 09/24/16 0416  Gross per 24 hour  Intake          1227.83 ml  Output                0 ml  Net          1227.83 ml   Left foot dressing clean. Foul odor present. Brisk leftt PT and monophasic left AT and peroneal.   Assessment/Planning: 83 y.o. female is s/p: dcb of left sfa/popliteal 3 Days Post-Op   Plan for amputations of left 2nd and 3rd toes tomorrow.  NPO past midnight. Obtain consent. Continue to hold coumadin.   Alvia Grove 09/24/2016 9:13 AM --  Laboratory CBC    Component Value Date/Time   WBC 15.4 (H) 09/24/2016 0529   HGB 8.9 (L) 09/24/2016 0529   HGB 10.5 (L) 06/27/2016 1054   HCT 27.9 (L) 09/24/2016 0529   HCT 32.0 (L) 06/27/2016 1054   PLT 314 09/24/2016 0529   PLT 368 06/27/2016 1054    BMET    Component Value Date/Time   NA 136 09/24/2016 0529   NA 133 (A) 06/30/2016   K 4.2 09/24/2016 0529   CL 104 09/24/2016 0529   CO2 26 09/24/2016 0529   GLUCOSE 277 (H) 09/24/2016 0529   BUN 38 (H) 09/24/2016 0529   BUN 59 (A) 06/30/2016   CREATININE 1.07 (H) 09/24/2016 0529   CREATININE 1.20 (H) 08/07/2016 1424   CALCIUM 8.5 (L) 09/24/2016 0529   GFRNONAA 44 (L) 09/24/2016 0529   GFRNONAA 40 (L) 08/07/2016 1424   GFRAA 51 (L) 09/24/2016 0529   GFRAA 46 (L) 08/07/2016 1424    COAG Lab Results  Component Value Date   INR 1.14 09/24/2016   INR 1.13 09/23/2016   INR 1.21 09/22/2016   No results found for: PTT  Antibiotics Anti-infectives    Start     Dose/Rate Route Frequency Ordered Stop   09/17/16 1000  azithromycin (ZITHROMAX) tablet 500 mg  Status:  Discontinued     500 mg Oral Daily 09/17/16 0938 09/19/16  0828   09/15/16 2200  vancomycin (VANCOCIN) IVPB 1000 mg/200 mL premix  Status:  Discontinued     1,000 mg 200 mL/hr over 60 Minutes Intravenous Every 24 hours 09/15/16 0000 09/17/16 0938   09/15/16 1000  cefTRIAXone (ROCEPHIN) 2 g in dextrose 5 % 50 mL IVPB  Status:  Discontinued     2 g 100 mL/hr over 30 Minutes Intravenous Every 24 hours 09/15/16 0122 09/17/16 0938   09/15/16 0000  metroNIDAZOLE (FLAGYL) IVPB 500 mg  Status:  Discontinued     500 mg 100 mL/hr over 60 Minutes Intravenous Every 8 hours 09/14/16 2350 09/17/16 0938   09/14/16 2230  ceFEPIme (MAXIPIME) 2 g in dextrose 5 % 50 mL IVPB     2 g 100 mL/hr over 30 Minutes Intravenous NOW 09/14/16 2215 09/15/16 0128   09/14/16 2145  levofloxacin (LEVAQUIN) IVPB 750 mg  Status:  Discontinued     750 mg 100 mL/hr over 90 Minutes Intravenous  Once 09/14/16 2138 09/14/16 2215   09/14/16 2145  vancomycin (VANCOCIN) IVPB  1000 mg/200 mL premix     1,000 mg 200 mL/hr over 60 Minutes Intravenous  Once 09/14/16 2138 09/15/16 0046       Virgina Jock, PA-C [facility] Office: [phone] Pager: [phone] 09/24/2016 9:13 AM   I have independently interviewed patient and agree with PA assessment and plan above. Continue plavix, lovenox until after surgery and coumadin can resume. OR tomorrow for left 2nd and possibly 3rd toes amp. Npo at midnight. Will get abi today.   Milbert Bixler C. Donzetta Matters, MD [facility] [place] Office: [phone] Pager: [phone]

## 2016-09-24 NOTE — Progress Notes (Signed)
Patient is in chair, no chair alarm available at this time

## 2016-09-24 NOTE — Progress Notes (Signed)
VASCULAR LAB PRELIMINARY  ARTERIAL  ABI completed:Right ABI indicates mild reduction in arterial blood flow.  Left ABI indicates adequate arterial blood flow    RIGHT    LEFT    PRESSURE WAVEFORM  PRESSURE WAVEFORM  BRACHIAL 159 T BRACHIAL 173 T  DP   DP    AT 128 M AT 141 B  PT 149 B PT 165 B  PER   PER    GREAT TOE  NA GREAT TOE  NA    RIGHT LEFT  ABI 0.86 0.95     Selim Durden, RVT 09/24/2016, 12:42 PM

## 2016-09-24 NOTE — Progress Notes (Signed)
Patient refuses to sign consent until family is present. Spoke with daughter, [name], on the phone. Family intends to be here later today.

## 2016-09-25 ENCOUNTER — Encounter (HOSPITAL_COMMUNITY)
Admission: EM | Disposition: A | Discharge status: Home Health | Payer: Self-pay | Source: Home / Self Care | Attending: Family Medicine

## 2016-09-25 ENCOUNTER — Inpatient Hospital Stay (HOSPITAL_COMMUNITY): Payer: Medicare Other | Admitting: Certified Registered"

## 2016-09-25 DIAGNOSIS — I96 Gangrene, not elsewhere classified: Secondary | ICD-10-CM

## 2016-09-25 HISTORY — PX: AMPUTATION: SHX166

## 2016-09-25 LAB — GLUCOSE, CAPILLARY
Glucose-Capillary: 124 mg/dL — ABNORMAL HIGH (ref 65–99)
Glucose-Capillary: 119 mg/dL — ABNORMAL HIGH (ref 65–99)
Glucose-Capillary: 196 mg/dL — ABNORMAL HIGH (ref 65–99)
Glucose-Capillary: 135 mg/dL — ABNORMAL HIGH (ref 65–99)

## 2016-09-25 LAB — RENAL FUNCTION PANEL
Albumin: 2.2 g/dL — ABNORMAL LOW (ref 3.5–5.0)
Anion gap: 6 (ref 5–15)
BUN: 32 mg/dL — ABNORMAL HIGH (ref 6–20)
CO2: 25 mmol/L (ref 22–32)
Calcium: 8.7 mg/dL — ABNORMAL LOW (ref 8.9–10.3)
Chloride: 105 mmol/L (ref 101–111)
Creatinine, Ser: 1.03 mg/dL — ABNORMAL HIGH (ref 0.44–1.00)
GFR calc Af Amer: 53 mL/min — ABNORMAL LOW (ref >60)
GFR calc non Af Amer: 46 mL/min — ABNORMAL LOW (ref >60)
Glucose, Bld: 106 mg/dL — ABNORMAL HIGH (ref 65–99)
Phosphorus: 3.5 mg/dL (ref 2.5–4.6)
Potassium: 4.6 mmol/L (ref 3.5–5.1)
Sodium: 136 mmol/L (ref 135–145)

## 2016-09-25 LAB — CBC
HCT: 27.9 % — ABNORMAL LOW (ref 36.0–46.0)
Hemoglobin: 9.1 g/dL — ABNORMAL LOW (ref 12.0–15.0)
MCH: 28.3 pg (ref 26.0–34.0)
MCHC: 32.6 g/dL (ref 30.0–36.0)
MCV: 86.6 fL (ref 78.0–100.0)
Platelets: 310 10*3/uL (ref 150–400)
RBC: 3.22 MIL/uL — ABNORMAL LOW (ref 3.87–5.11)
RDW: 17.7 % — ABNORMAL HIGH (ref 11.5–15.5)
WBC: 17.2 10*3/uL — ABNORMAL HIGH (ref 4.0–10.5)

## 2016-09-25 LAB — PROTIME-INR
INR: 1.07
Prothrombin Time: 13.9 seconds (ref 11.4–15.2)

## 2016-09-25 SURGERY — AMPUTATION DIGIT
Anesthesia: Monitor Anesthesia Care | Site: Toe | Laterality: Left

## 2016-09-25 MED ORDER — WARFARIN - PHARMACIST DOSING INPATIENT
Freq: Every day | Status: DC
  Administered 2016-09-26: 18:00:00

## 2016-09-25 MED ORDER — 0.9 % SODIUM CHLORIDE (POUR BTL) OPTIME
TOPICAL | Status: DC | PRN
  Administered 2016-09-25: 1000 mL

## 2016-09-25 MED ORDER — SODIUM CHLORIDE 0.9 % IV SOLN
INTRAVENOUS | Status: DC
  Administered 2016-09-25: 10:00:00 via INTRAVENOUS

## 2016-09-25 MED ORDER — DEXMEDETOMIDINE HCL IN NACL 200 MCG/50ML IV SOLN
INTRAVENOUS | Status: AC
  Filled 2016-09-25: qty 50

## 2016-09-25 MED ORDER — PROPOFOL 1000 MG/100ML IV EMUL
INTRAVENOUS | Status: AC
  Filled 2016-09-25: qty 100

## 2016-09-25 MED ORDER — LIDOCAINE HCL (PF) 1 % IJ SOLN
INTRAMUSCULAR | Status: AC
  Filled 2016-09-25: qty 30

## 2016-09-25 MED ORDER — PROPOFOL 500 MG/50ML IV EMUL
INTRAVENOUS | Status: DC | PRN
  Administered 2016-09-25: 50 ug/kg/min via INTRAVENOUS

## 2016-09-25 MED ORDER — VANCOMYCIN HCL IN DEXTROSE 1-5 GM/200ML-% IV SOLN
INTRAVENOUS | Status: AC
  Filled 2016-09-25: qty 200

## 2016-09-25 MED ORDER — DEXMEDETOMIDINE HCL IN NACL 200 MCG/50ML IV SOLN
INTRAVENOUS | Status: DC | PRN
  Administered 2016-09-25: 10 ug via INTRAVENOUS

## 2016-09-25 MED ORDER — FENTANYL CITRATE (PF) 100 MCG/2ML IJ SOLN
INTRAMUSCULAR | Status: AC
  Filled 2016-09-25: qty 2

## 2016-09-25 MED ORDER — PHENYLEPHRINE 40 MCG/ML (10ML) SYRINGE FOR IV PUSH (FOR BLOOD PRESSURE SUPPORT)
PREFILLED_SYRINGE | INTRAVENOUS | Status: AC
  Filled 2016-09-25: qty 10

## 2016-09-25 MED ORDER — ENOXAPARIN SODIUM 80 MG/0.8ML ~~LOC~~ SOLN
1.0000 mg/kg | SUBCUTANEOUS | Status: DC
  Administered 2016-09-26 – 2016-09-27 (×2): 65 mg via SUBCUTANEOUS
  Filled 2016-09-25 (×2): qty 0.8

## 2016-09-25 MED ORDER — DEXMEDETOMIDINE HCL IN NACL 400 MCG/100ML IV SOLN: INTRAVENOUS | Status: DC | PRN

## 2016-09-25 MED ORDER — PHENYLEPHRINE 40 MCG/ML (10ML) SYRINGE FOR IV PUSH (FOR BLOOD PRESSURE SUPPORT)
PREFILLED_SYRINGE | INTRAVENOUS | Status: DC | PRN
  Administered 2016-09-25 (×2): 80 ug via INTRAVENOUS

## 2016-09-25 MED ORDER — PROPOFOL 10 MG/ML IV BOLUS
INTRAVENOUS | Status: DC | PRN
  Administered 2016-09-25: 30 mg via INTRAVENOUS

## 2016-09-25 MED ORDER — ONDANSETRON HCL 4 MG/2ML IJ SOLN
INTRAMUSCULAR | Status: DC | PRN
  Administered 2016-09-25: 4 mg via INTRAVENOUS

## 2016-09-25 MED ORDER — WARFARIN SODIUM 7.5 MG PO TABS
3.7500 mg | ORAL_TABLET | Freq: Once | ORAL | Status: AC
  Administered 2016-09-25: 3.75 mg via ORAL
  Filled 2016-09-25 (×2): qty 0.5

## 2016-09-25 MED ORDER — MIDAZOLAM HCL 2 MG/2ML IJ SOLN
INTRAMUSCULAR | Status: AC
  Filled 2016-09-25: qty 2

## 2016-09-25 MED ORDER — PROPOFOL 10 MG/ML IV BOLUS
INTRAVENOUS | Status: AC
  Filled 2016-09-25: qty 20

## 2016-09-25 MED ORDER — ONDANSETRON HCL 4 MG/2ML IJ SOLN
INTRAMUSCULAR | Status: AC
  Filled 2016-09-25: qty 2

## 2016-09-25 SURGICAL SUPPLY — 32 items
BNDG GAUZE ELAST 4 BULKY (GAUZE/BANDAGES/DRESSINGS) ×2 IMPLANT
BRUSH SCRUB EZ PLAIN DRY (MISCELLANEOUS) ×4 IMPLANT
CANISTER SUCT 3000ML PPV (MISCELLANEOUS) ×2 IMPLANT
CLIP LIGATING EXTRA MED SLVR (CLIP) ×2 IMPLANT
CLIP LIGATING EXTRA SM BLUE (MISCELLANEOUS) ×2 IMPLANT
COVER SURGICAL LIGHT HANDLE (MISCELLANEOUS) ×4 IMPLANT
ELECT REM PT RETURN 9FT ADLT (ELECTROSURGICAL) ×2
ELECTRODE REM PT RTRN 9FT ADLT (ELECTROSURGICAL) ×1 IMPLANT
GAUZE SPONGE 4X4 12PLY STRL (GAUZE/BANDAGES/DRESSINGS) ×2 IMPLANT
GAUZE SPONGE 4X4 12PLY STRL LF (GAUZE/BANDAGES/DRESSINGS) ×2 IMPLANT
GLOVE BIOGEL PI IND STRL 6.5 (GLOVE) ×1 IMPLANT
GLOVE BIOGEL PI IND STRL 7.0 (GLOVE) ×1 IMPLANT
GLOVE BIOGEL PI INDICATOR 6.5 (GLOVE) ×1
GLOVE BIOGEL PI INDICATOR 7.0 (GLOVE) ×1
GLOVE SS BIOGEL STRL SZ 7.5 (GLOVE) ×1 IMPLANT
GLOVE SUPERSENSE BIOGEL SZ 7.5 (GLOVE) ×1
GLOVE SURG SS PI 6.0 STRL IVOR (GLOVE) ×2 IMPLANT
GOWN STRL REUS W/ TWL LRG LVL3 (GOWN DISPOSABLE) ×2 IMPLANT
GOWN STRL REUS W/TWL LRG LVL3 (GOWN DISPOSABLE) ×2
KIT BASIN OR (CUSTOM PROCEDURE TRAY) ×2 IMPLANT
KIT ROOM TURNOVER OR (KITS) ×2 IMPLANT
NEEDLE 22X1 1/2 (OR ONLY) (NEEDLE) IMPLANT
NS IRRIG 1000ML POUR BTL (IV SOLUTION) ×2 IMPLANT
PACK GENERAL/GYN (CUSTOM PROCEDURE TRAY) ×2 IMPLANT
PAD ARMBOARD 7.5X6 YLW CONV (MISCELLANEOUS) ×4 IMPLANT
SUT ETHILON 3 0 PS 1 (SUTURE) ×2 IMPLANT
SUT VIC AB 3-0 SH 27 (SUTURE) ×1
SUT VIC AB 3-0 SH 27X BRD (SUTURE) ×1 IMPLANT
SYR CONTROL 10ML LL (SYRINGE) IMPLANT
TOWEL GREEN STERILE (TOWEL DISPOSABLE) ×2 IMPLANT
UNDERPAD 30X30 (UNDERPADS AND DIAPERS) ×2 IMPLANT
WATER STERILE IRR 1000ML POUR (IV SOLUTION) ×2 IMPLANT

## 2016-09-25 NOTE — Anesthesia Procedure Notes (Signed)
Anesthesia Regional Block: Ankle block   Pre-Anesthetic Checklist: ,, timeout performed, Correct Patient, Correct Site, Correct Laterality, Correct Procedure, Correct Position, site marked, Risks and benefits discussed,  Surgical consent,  Pre-op evaluation,  At surgeon's request and post-op pain management  Laterality: Left and Lower  Prep: chloraprep       Needles:      Needle Length: 4cm  Needle Gauge: 24     Additional Needles:   Narrative:  Start time: 09/25/2016 12:15 PM End time: 09/25/2016 10:25 AM Injection made incrementally with aspirations every 5 mL. Anesthesiologist: Namon Villarin  Additional Notes: Ankle block, c anesthetic local infiltration 2% Xylocaine 12cc

## 2016-09-25 NOTE — Progress Notes (Signed)
ANTICOAGULATION CONSULT NOTE - Initial Consult  Pharmacy Consult for warfarin Indication: atrial fibrillation  Allergies  Allergen Reactions  . Penicillins Itching, Rash and Other (See Comments)    Tolerated unasyn, zosyn & cephalosporins in the past.  Has patient had a PCN reaction causing immediate rash, facial/tongue/throat swelling, SOB or lightheadedness with hypotension: Yes Has patient had a PCN reaction causing severe rash involving mucus membranes or skin necrosis: Unk Has patient had a PCN reaction that required hospitalization: Unk Has patient had a PCN reaction occurring within the last 10 years: Unk If all of the above answers are "NO", then may proceed with Cephalosporins  . Other     Patient tolerates amoxicillin and cephalosporins   Patient Measurements: Height: 5\' 6"  (167.6 cm) Weight: 135 lb (61.2 kg) IBW/kg (Calculated) : 59.3  Vital Signs: Temp: 98.3 F (36.8 C) (06/25 1214) Temp Source: Oral (06/25 0655) BP: 171/66 (06/25 1214) Pulse Rate: 60 (06/25 1214)  Labs:  Recent Labs  09/23/16 0417 09/24/16 0529 09/25/16 0524  HGB 9.0* 8.9* 9.1*  HCT 28.3* 27.9* 27.9*  PLT 352 314 310  LABPROT 14.6 14.7 13.9  INR 1.13 1.14 1.07  CREATININE  --  1.07* 1.03*   Estimated Creatinine Clearance: 33.3 mL/min (A) (by C-G formula based on SCr of 1.03 mg/dL (H)).  Medical History: Past Medical History:  Diagnosis Date  . Anemia    previous blood transfusions  . Arthritis    "all over"  . Asthma   . Bradycardia    requiring previous d/c of BB and reduction of amiodarone  . CAD (coronary artery disease)    nonobstructive per notes  . Chronic diastolic CHF (congestive heart failure) ([place])   . CKD (chronic kidney disease), stage III   . Complication of blood transfusion    "got the wrong blood type at [facility] in ~ 2015; no adverse reaction that we are aware of"/daughter, [name] (01/27/2016)  . COPD (chronic obstructive pulmonary disease) ([place])   .  Depression    "light case"  . DVT (deep venous thrombosis) ([place]) 01/2016   a. LLE DVT 01/2016 - switched from Eliquis to Coumadin.  . Gastric stenosis    a. s/p stomach tube  . GERD (gastroesophageal reflux disease)   . History of blood transfusion    "several" (01/27/2016)  . History of stomach ulcers   . Hyperlipidemia   . Hypertension   . Hypothyroidism   . Paraesophageal hernia   . Perforated gastric ulcer ([place])   . Seasonal allergies   . SIADH (syndrome of inappropriate ADH production) ([place])    [name] 01/10/2015  . Small bowel obstruction ([place])    "I don't know how many" (01/11/2015)  . Stroke ([place])    "light one"  . Type II diabetes mellitus ([place])    "related to prednisone use  for > 20 yr; once predinose stopped; no more DM RX" (01/27/2016)  . Ventral hernia with bowel obstruction    Medications:  Prescriptions Prior to Admission  Medication Sig Dispense Refill Last Dose  . AMINO ACIDS-PROTEIN HYDROLYS PO Take 30 mLs by mouth 2 (two) times daily.   09/14/2016 at Unknown time  . atorvastatin (LIPITOR) 10 MG tablet TAKE ONE TABLET BY MOUTH ONCE DAILY 90 tablet 1 09/14/2016 at Unknown time  . b complex vitamins tablet Take 1 tablet by mouth daily.    09/14/2016 at Unknown time  . benzonatate (TESSALON PERLES) 100 MG capsule Take two capsules by mouth three times daily as  needed for cough 80 capsule 1 Past Week at Unknown time  . calcium-vitamin D (OSCAL WITH D) 500-200 MG-UNIT per tablet Take 1 tablet by mouth daily with breakfast.    09/14/2016 at Unknown time  . carvedilol (COREG) 6.25 MG tablet Take 1 tablet (6.25 mg total) by mouth 2 (two) times daily. 60 tablet 0 09/14/2016 at 0930  . chlorhexidine (PERIDEX) 0.12 % solution Use as directed 15 mLs in the mouth or throat 2 (two) times daily.   09/14/2016 at Unknown time  . feeding supplement (OSMOLITE 1 CAL) LIQD Take 237 mLs by mouth at bedtime.   09/13/2016 at Unknown time  . fluticasone (FLONASE) 50 MCG/ACT nasal spray Place 2  sprays into both nostrils daily. (Patient taking differently: Place 1 spray into both nostrils 2 (two) times daily. ) 16 g 3 09/14/2016 at Unknown time  . furosemide (LASIX) 20 MG tablet Take 1 tablet (20 mg total) by mouth daily. 30 tablet 3 09/14/2016 at Unknown time  . gabapentin (NEURONTIN) 300 MG capsule 1 tablet by mouth three times daily, may have additional dose every 8 hours as needed pain (Patient taking differently: Take 600 mg by mouth 3 (three) times daily. ) 90 capsule 0 09/14/2016 at Unknown time  . insulin aspart (NOVOLOG FLEXPEN) 100 UNIT/ML FlexPen Before each meal 3 times a day, 140-199 - 2 units, 200-250 - 4 units, 251-299 - 6 units,  300-349 - 8 units,  350 or above 10 units. Insulin PEN if approved, provide syringes and needles if needed. 15 mL 0 09/14/2016 at Unknown time  . ipratropium-albuterol (DUONEB) 0.5-2.5 (3) MG/3ML SOLN Take 3 mLs by nebulization every 6 (six) hours as needed (sob). Use 3 times daily x 4 days then every 6 hours as needed. 360 mL 4 09/14/2016 at Unknown time  . isosorbide mononitrate (IMDUR) 30 MG 24 hr tablet Take 30 mg by mouth daily.   09/13/2016 at Unknown time  . latanoprost (XALATAN) 0.005 % ophthalmic solution Place 1 drop into both eyes at bedtime. 2.5 mL 12 09/13/2016 at Unknown time  . levalbuterol (XOPENEX HFA) 45 MCG/ACT inhaler Inhale 2 puffs into the lungs every 6 (six) hours as needed for wheezing or shortness of breath. 1 Inhaler 12 09/14/2016 at Unknown time  . levothyroxine (SYNTHROID, LEVOTHROID) 137 MCG tablet TAKE 1 TABLET (137 MCG TOTAL) BY MOUTH DAILY BEFORE BREAKFAST. 30 tablet 2 09/14/2016 at Unknown time  . Multiple Vitamin (MULTIVITAMIN WITH MINERALS) TABS tablet Take 1 tablet by mouth daily.   09/14/2016 at Unknown time  . Polyethyl Glycol-Propyl Glycol 0.4-0.3 % SOLN Place 1 drop into both eyes 4 (four) times daily.    09/14/2016 at Unknown time  . RESTASIS 0.05 % ophthalmic emulsion USE 1 DROP INTO BOTH EYES TWICE DAILY 60 mL 3 09/14/2016  at Unknown time  . sertraline (ZOLOFT) 100 MG tablet TAKE ONE TABLET BY MOUTH ONCE DAILY FOR DEPRESSION 90 tablet 1 09/14/2016 at Unknown time  . warfarin (COUMADIN) 2.5 MG tablet TAKE AS DIRECTED BY COUMADIN CLINIC. (Patient taking differently: Take 1.5 tablets (3.75 mg) by mouth Tues thru Sun then Take 1 tablet (2.5 mg) by mouth on Mon) 40 tablet 1 09/13/2016 at 1800  . Water For Irrigation, Sterile (FREE WATER) SOLN Place 230 mLs into feeding tube 3 (three) times daily.   09/14/2016 at Unknown time  . acetaminophen (TYLENOL) 500 MG tablet Take 1,000 mg by mouth every 6 (six) hours as needed (pain).   unknown  . Maltodextrin-Xanthan Gum (RESOURCE  THICKENUP CLEAR) POWD Take 120 g by mouth as needed (use to thicken liquids.). 20 Can 0 unknown  . polyethylene glycol (MIRALAX / GLYCOLAX) packet Take 17 g by mouth daily as needed for mild constipation.   unknown   Assessment: Shannon [name] 40 yoF on warfarin PTA for afib CHad2vasc2 = 6. Warfarin has been on hold since 6/18 for toe amputation earlier today (6/25).  Patient has been bridged with Lovenox. HgB stable low 9.1. INR 1.07  PTA warfarin regimen-  2.5 mg on Mondays and 3.75 mg all other days  Goal of Therapy:  INR 2-3 Monitor platelets by anticoagulation protocol: Yes   Plan:  Warfarin 3.75mg  x1 tonight Lovenox scheduled to resume tomorrow AM Monitor for s/sx of bleeding  Shannon [name] 09/25/2016,3:15 PM

## 2016-09-25 NOTE — Progress Notes (Addendum)
Physical Therapy Treatment Patient Details Name: Shannon [name] MRN: [id number] DOB: 09-20-1923 Today's Date: 09/25/2016    History of Present Illness Pt is a 83 y.o. female who presented via EMS after several days of worsening cough and SOB on 09/14/2016, noted in the ED to have gangrene of 2nd toe of L foot with purulent and foul smelling discharge, went from [facility] to [facility] on 09/15/2016. s/p angioplasty  of L SFA/popliteal artery on 09/21/16. 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  PMH significant for anemia, arthritis, asthma, bradycardia, CAD< chronic diastolic CHF, CKD, COPD, depression, DVT, GERD, HLD, HTN, hypothyroidism, paraesophageal hernia, perforated stroke, DM, ventral hernia with bowel obstruction.     PT Comments    Pt was able to assist with transfer to chair to elevate both and protect her new L toe amputation surgery.  MD has not posted WB information and is NWB until clarification is obtained.  Will expect her to need acute therapy to progress with her transfers and strengthening, and to advance gait as she is able.  Maintain NWB until further notice from MD.  Follow Up Recommendations  Home health PT;Supervision/Assistance - 24 hour     Equipment Recommendations  None recommended by PT    Recommendations for Other Services       Precautions / Restrictions Precautions Precautions: Fall Restrictions Weight Bearing Restrictions: Yes RLE Weight Bearing: Weight bearing as tolerated LLE Weight Bearing: Non weight bearing (until MD gives new orders)    Mobility  Bed Mobility Overal bed mobility: Needs Assistance Bed Mobility: Supine to Sit     Supine to sit: Min assist        Transfers Overall transfer level: Needs assistance Equipment used: 1 person hand held assist Transfers: Sit to/from Stand Sit to Stand: Min assist;Mod assist Stand pivot transfers: Min assist;Mod assist       General transfer  comment: increased time and assist to avoid WB on LLE  Ambulation/Gait             General Gait Details: deferred   Stairs            Wheelchair Mobility    Modified Rankin (Stroke Patients Only)       Balance Overall balance assessment: Needs assistance   Sitting balance-Leahy Scale: Fair     Standing balance support: Bilateral upper extremity supported Standing balance-Leahy Scale: Poor                              Cognition Arousal/Alertness: Awake/alert Behavior During Therapy: WFL for tasks assessed/performed Overall Cognitive Status: Within Functional Limits for tasks assessed                                 General Comments: Likely baseline      Exercises      General Comments General comments (skin integrity, edema, etc.): Pt was able to pivot with PT to chair and elevated legs to support recovery from surgery, NWB on LLE      Pertinent Vitals/Pain Pain Assessment: No/denies pain    Home Living                      Prior Function            PT Goals (current goals can now be found in  the care plan section) Acute Rehab PT Goals Patient Stated Goal: get home  Progress towards PT goals: Progressing toward goals    Frequency    Min 3X/week      PT Plan Current plan remains appropriate    Co-evaluation              AM-PAC PT "6 Clicks" Daily Activity  Outcome Measure  Difficulty turning over in bed (including adjusting bedclothes, sheets and blankets)?: Total Difficulty moving from lying on back to sitting on the side of the bed? : Total Difficulty sitting down on and standing up from a chair with arms (e.g., wheelchair, bedside commode, etc,.)?: Total Help needed moving to and from a bed to chair (including a wheelchair)?: A Lot Help needed walking in hospital room?: A Lot Help needed climbing 3-5 steps with a railing? : Total 6 Click Score: 8    End of Session Equipment Utilized  During Treatment: Oxygen Activity Tolerance: Patient tolerated treatment well Patient left: in chair;with call bell/phone within reach;with chair alarm set Nurse Communication: Mobility status PT Visit Diagnosis: Unsteadiness on feet (R26.81);Muscle weakness (generalized) (M62.81)     Time: 1245-1311 PT Time Calculation (min) (ACUTE ONLY): 26 min  Charges:  $Therapeutic Activity: 23-37 mins                    G Codes:  Functional Assessment Tool Used: AM-PAC 6 Clicks Basic Mobility    Ivar Drape 09/25/2016, 1:36 PM   Samul Dada, PT MS Acute Rehab Dept. Number: [facility] [id number] and [facility] [phone]

## 2016-09-25 NOTE — Progress Notes (Signed)
   09/25/16 0900  PT Visit Information  Last PT Received On 09/25/16  Reason Eval/Treat Not Completed Patient at procedure or test/unavailable  Will try later as time and pt allow.  Mee Hives, PT MS Acute Rehab Dept. Number: [place] [facility] [place]

## 2016-09-25 NOTE — Interval H&P Note (Signed)
History and Physical Interval Note:  09/25/2016 10:06 AM  Shannon [name]  has presented today for surgery, with the diagnosis of Peripheral Vascular Disease with Gangrene Left 2nd Toe I70.262  The various methods of treatment have been discussed with the patient and family. After consideration of risks, benefits and other options for treatment, the patient has consented to  Procedure(s): AMPUTATION DIGIT- LEFT 2ND AND 3RD TOES (Left) as a surgical intervention .  The patient's history has been reviewed, patient examined, no change in status, stable for surgery.  I have reviewed the patient's chart and labs.  Questions were answered to the patient's satisfaction.     Curt Jews

## 2016-09-25 NOTE — Anesthesia Preprocedure Evaluation (Signed)
Anesthesia Evaluation  Patient identified by MRN, date of birth, ID band Patient awake    Airway Mallampati: II   Neck ROM: Limited    Dental  (+) Edentulous Lower   Pulmonary shortness of breath, asthma , COPD,    breath sounds clear to auscultation       Cardiovascular hypertension, + CAD, + Peripheral Vascular Disease and +CHF   Rhythm:Regular Rate:Normal     Neuro/Psych    GI/Hepatic PUD, GERD  Poorly Controlled,  Endo/Other  diabetesHypothyroidism   Renal/GU CRFRenal disease     Musculoskeletal   Abdominal   Peds  Hematology  (+) anemia ,   Anesthesia Other Findings   Reproductive/Obstetrics                             Anesthesia Physical Anesthesia Plan  ASA: IV  Anesthesia Plan: MAC   Post-op Pain Management:    Induction:   PONV Risk Score and Plan: 1 and Ondansetron and Dexamethasone  Airway Management Planned: Natural Airway  Additional Equipment:   Intra-op Plan:   Post-operative Plan:   Informed Consent: I have reviewed the patients History and Physical, chart, labs and discussed the procedure including the risks, benefits and alternatives for the proposed anesthesia with the patient or authorized representative who has indicated his/her understanding and acceptance.     Plan Discussed with: CRNA  Anesthesia Plan Comments:         Anesthesia Quick Evaluation

## 2016-09-25 NOTE — Transfer of Care (Signed)
Immediate Anesthesia Transfer of Care Note  Patient: Shannon [name]  Procedure(s) Performed: Procedure(s): AMPUTATION DIGIT- LEFT 2ND AND 3RD TOES (Left)  Patient Location: PACU  Anesthesia Type:MAC  Level of Consciousness: awake, alert  and oriented  Airway & Oxygen Therapy: Patient Spontanous Breathing  Post-op Assessment: Report given to RN and Post -op Vital signs reviewed and stable  Post vital signs: Reviewed and stable  Last Vitals:  Vitals:   09/25/16 0930 09/25/16 0935  BP: (!) 204/76 (!) 214/66  Pulse: 63 63  Resp: 15 13  Temp:      Last Pain:  Vitals:   09/25/16 0655  TempSrc: Oral  PainSc:       Patients Stated Pain Goal: 0 (54/62/70 3500)  Complications: No apparent anesthesia complications

## 2016-09-25 NOTE — H&P (View-Only) (Signed)
[facility] Progress Note  Subjective    No complaints. Eating breakfast currently.  Objective Vitals:   09/24/16 0415 09/24/16 0807  BP: (!) 178/54 (!) 175/55  Pulse: 75 66  Resp: 18 18  Temp: 99.3 F (37.4 C) 98.4 F (36.9 C)    Intake/Output Summary (Last 24 hours) at 09/24/16 0913 Last data filed at 09/24/16 0416  Gross per 24 hour  Intake          1227.83 ml  Output                0 ml  Net          1227.83 ml   Left foot dressing clean. Foul odor present. Brisk leftt PT and monophasic left AT and peroneal.   Assessment/Planning: 83 y.o. female is s/p: dcb of left sfa/popliteal 3 Days Post-Op   Plan for amputations of left 2nd and 3rd toes tomorrow.  NPO past midnight. Obtain consent. Continue to hold coumadin.   Alvia Grove 09/24/2016 9:13 AM --  Laboratory CBC    Component Value Date/Time   WBC 15.4 (H) 09/24/2016 0529   HGB 8.9 (L) 09/24/2016 0529   HGB 10.5 (L) 06/27/2016 1054   HCT 27.9 (L) 09/24/2016 0529   HCT 32.0 (L) 06/27/2016 1054   PLT 314 09/24/2016 0529   PLT 368 06/27/2016 1054    BMET    Component Value Date/Time   NA 136 09/24/2016 0529   NA 133 (A) 06/30/2016   K 4.2 09/24/2016 0529   CL 104 09/24/2016 0529   CO2 26 09/24/2016 0529   GLUCOSE 277 (H) 09/24/2016 0529   BUN 38 (H) 09/24/2016 0529   BUN 59 (A) 06/30/2016   CREATININE 1.07 (H) 09/24/2016 0529   CREATININE 1.20 (H) 08/07/2016 1424   CALCIUM 8.5 (L) 09/24/2016 0529   GFRNONAA 44 (L) 09/24/2016 0529   GFRNONAA 40 (L) 08/07/2016 1424   GFRAA 51 (L) 09/24/2016 0529   GFRAA 46 (L) 08/07/2016 1424    COAG Lab Results  Component Value Date   INR 1.14 09/24/2016   INR 1.13 09/23/2016   INR 1.21 09/22/2016   No results found for: PTT  Antibiotics Anti-infectives    Start     Dose/Rate Route Frequency Ordered Stop   09/17/16 1000  azithromycin (ZITHROMAX) tablet 500 mg  Status:  Discontinued     500 mg Oral Daily 09/17/16 0938 09/19/16  0828   09/15/16 2200  vancomycin (VANCOCIN) IVPB 1000 mg/200 mL premix  Status:  Discontinued     1,000 mg 200 mL/hr over 60 Minutes Intravenous Every 24 hours 09/15/16 0000 09/17/16 0938   09/15/16 1000  cefTRIAXone (ROCEPHIN) 2 g in dextrose 5 % 50 mL IVPB  Status:  Discontinued     2 g 100 mL/hr over 30 Minutes Intravenous Every 24 hours 09/15/16 0122 09/17/16 0938   09/15/16 0000  metroNIDAZOLE (FLAGYL) IVPB 500 mg  Status:  Discontinued     500 mg 100 mL/hr over 60 Minutes Intravenous Every 8 hours 09/14/16 2350 09/17/16 0938   09/14/16 2230  ceFEPIme (MAXIPIME) 2 g in dextrose 5 % 50 mL IVPB     2 g 100 mL/hr over 30 Minutes Intravenous NOW 09/14/16 2215 09/15/16 0128   09/14/16 2145  levofloxacin (LEVAQUIN) IVPB 750 mg  Status:  Discontinued     750 mg 100 mL/hr over 90 Minutes Intravenous  Once 09/14/16 2138 09/14/16 2215   09/14/16 2145  vancomycin (VANCOCIN) IVPB  1000 mg/200 mL premix     1,000 mg 200 mL/hr over 60 Minutes Intravenous  Once 09/14/16 2138 09/15/16 0046       Virgina Jock, PA-C [facility] Office: [phone] Pager: [phone] 09/24/2016 9:13 AM   I have independently interviewed patient and agree with PA assessment and plan above. Continue plavix, lovenox until after surgery and coumadin can resume. OR tomorrow for left 2nd and possibly 3rd toes amp. Npo at midnight. Will get abi today.   Brandon C. Donzetta Matters, MD [facility] [place] Office: [phone] Pager: [phone]

## 2016-09-25 NOTE — Progress Notes (Signed)
PROGRESS NOTE    Shannon [name]  UXN:[id number] DOB: [birth year] DOA: 09/14/2016 PCP: Kirt Boys, DO  Outpatient Specialists:     Brief Narrative:   53 fem CKD III Afib CHad2vasc2 = 6 + Tachybradysyndrome, on coumadin COPD DVT in past CVA Chr L Cerebellear infracts HTN ty II DM GO Obstrution s/p G tube HLD GERD Diastolic HF hypothryoid Pressure ulcer  This is her 8th recent admit She has been offered pallaitive care in the past as her condition is the sum of her mutiple medical issues issues  prior unwilling to undergo amoputation and wishes to try medication only but now has recanted and is willing for surgery Anxiety has affected her care Patient has stabilized and is scheduled for amputation of toe 6/25 and her vascular surgeon   Assessment & Plan:   Principal Problem:   COPD exacerbation (HCC) Active Problems:   Diabetes mellitus type 2 in nonobese (HCC)   Chronic kidney insufficiency, stage 3 (moderate)   Accelerated hypertension   Hyponatremia   Chronic diastolic CHF (congestive heart failure) (HCC)   Atherosclerosis of native arteries of the extremities with gangrene (HCC)   Gangrene of toe of left foot (HCC)  Acute hypoxemic respiratory failure--not on Home O2 COPD exacerbation Left-sided pleural effusion-tiny and non-amenable to [facility] 6/17  ABG 6/17 on venturi mask 50% shows alkalosis--?anxious--add roxanol 5 q 4 prn SOB  weaned steroids IV to prednisone 40mg  6/18 and tapered off 6/20  2 vw CXR 6/21 shows atelectasis--has no fever--not overly concerned re: PNA  albuterol 2.5 q2 prn, Flonase 50 g bid-added Dulera 6/18--Azithromycin  6/18 stopped  Wean oxygen tolerated, seems like she will need it with ambulation has went to 88% with therapy on 6/22  Transferred back to telemetry 6/23  Given that she has coughed 6.25.18 will get 2 view x-ray a.m.  Pleuritic CP 6/19  EKG: normal EKG, normal sinus rhythm, normal sinus rhythm, qrs AXIS 0, PR  0.08  Troponin grey zone and reassuring  L foot gangrene Prior DVT Leukocytosis 2/2 de margination from IV solu-medrol , component of gangrene as well  ischemic toe. S/P Ray amputation 09/25/2016  She does not have sepsis  No need abx currently-- demarcating dry gangrene  ABI performed 09/24/16 shows 0.86 R, 0.96L  Angio s/p stent 6/21-needs plavix per [facility] for 3 months at least ending 12/22/16  INR 1.07 Lovenox as per vascular surgery and also when can resume Coumadin?  careful with post angio pain control, postop pain control Percocet 5-325 one-to every 3 when necessary  Afib CHad2 Vasc2= 6, usually on Coumadin at home Tachybrady Mod ctrll HTN Diastolic CHF EF 60-65% CAD  Cont Coreg 6.25-->12.5 on 6/20, IMdur 30 xl  Continue Prn hydralazine 25  Lasix stopped 6/18  Lovenox continues as per vascular surgeon as above    GO Obst 09/2015 GERd  Cont Tube feeds daily at bedtime  Can take PO liquids--cont free water 230 ml bid   Cont MirLAX  Dm tY ii C/B NEPHRO/NEUROPATHY Mild DKA resolved quickly 6/18-6/29 and insulin GTT d/c 6/19 am  [no long acting at home] levemir 12 U--15 U changed to Lantus 15 units given low CBG p.m., high CBG a.m.  Put back on Ac coverage, sugars 106-119  Cont gabapentin 600 tid  CKD stg III   basleine GFR anywhere 25-40 and GFR is very stable  Off lasix since 6/18  CVA c Chr Infarcts   hypothryoid  Cont synthroid 137 mcg  TSH in 3 week  Depression, situational  Cont sertraline 100 qd  Moderate malnutrition  Albumin 2.2, will need supplements on discharge   SCD and Lovenox currently, will need to dose Coumadin 6.26.18 and bridge to an INR closer to 2 prior to discharge Brief discussion with daughter this morning preop, will discuss with her it in a.m. Partial code likely need SNF--therapy eval pending   Consultants:   vasc  Procedures:   None   Antimicrobials:   Cefepime and flagyll 6/14-->6/17  azithro 6/14-->6/18     Subjective:  Awake alert eating some clears Just back from surgery and not in any pain Note that oxygen is on again Also tells me that she has some cough with mild sputum No chest pain Overall seems in good spirits   Objective: Vitals:   09/25/16 1127 09/25/16 1143 09/25/16 1158 09/25/16 1214  BP: (!) 144/63 (!) 157/64 (!) 153/63 (!) 171/66  Pulse: 61 61 61 60  Resp: 15 17 16 18   Temp: 98.3 F (36.8 C)  98.4 F (36.9 C) 98.3 F (36.8 C)  TempSrc:      SpO2: 95% 94% 95% 96%  Weight:      Height:        Intake/Output Summary (Last 24 hours) at 09/25/16 1355 Last data filed at 09/25/16 1241  Gross per 24 hour  Intake              360 ml  Output              705 ml  Net             -345 ml   Filed Weights   09/23/16 0130 09/23/16 2018 09/24/16 2055  Weight: 68.6 kg (151 lb 3.8 oz) 64 kg (141 lb) 61.2 kg (135 lb)    Examination:  eomi ncat, no JVD Pleasant, no icterus no pallor no bruit No pallor no ict ctaSlight coarse breath sounds right posterior lung fields s1 s 2no m/r/g abd flat-post-op changes, PEG in place Wound is wrapped Euthymic pleasant   Data Reviewed: I have personally reviewed following labs and imaging studies  CBC:  Recent Labs Lab 09/19/16 0259 09/19/16 1104  09/21/16 0504 09/22/16 0409 09/23/16 0417 09/24/16 0529 09/25/16 0524  WBC 20.0* 18.1*  < > 16.7* 11.4* 14.3* 15.4* 17.2*  NEUTROABS 16.1* 15.9*  --   --   --   --   --   --   HGB 10.1* 9.8*  < > 10.0* 10.0* 9.0* 8.9* 9.1*  HCT 31.2* 30.4*  < > 30.9* 31.5* 28.3* 27.9* 27.9*  MCV 89.4 88.4  < > 88.5 89.5 88.7 89.4 86.6  PLT 378 361  < > 400 389 352 314 310  < > = values in this interval not displayed. Basic Metabolic Panel:  Recent Labs Lab 09/19/16 1104 09/20/16 0608 09/22/16 0409 09/24/16 0529 09/25/16 0524  NA 136 140 141 136 136  K 4.0 3.8 4.1 4.2 4.6  CL 103 104 106 104 105  CO2 25 28 29 26 25   GLUCOSE 415* 358* 298* 277* 106*  BUN 50* 54* 43* 38* 32*   CREATININE 1.06* 1.05* 0.99 1.07* 1.03*  CALCIUM 8.9 9.2 8.7* 8.5* 8.7*  PHOS  --   --   --   --  3.5   GFR: Estimated Creatinine Clearance: 33.3 mL/min (A) (by C-G formula based on SCr of 1.03 mg/dL (H)). Liver Function Tests:  Recent Labs Lab 09/19/16 0259 09/20/16 0608 09/22/16 0409 09/25/16 0524  AST 20  19 30  --   ALT 16 16 25   --   ALKPHOS 62 75 61  --   BILITOT 0.3 0.4 0.4  --   PROT 6.4* 6.5 5.9*  --   ALBUMIN 2.5* 2.4* 2.2* 2.2*   No results for input(s): LIPASE, AMYLASE in the last 168 hours. No results for input(s): AMMONIA in the last 168 hours. Coagulation Profile:  Recent Labs Lab 09/21/16 0504 09/22/16 0409 09/23/16 0417 09/24/16 0529 09/25/16 0524  INR 1.41 1.21 1.13 1.14 1.07   Cardiac Enzymes:  Recent Labs Lab 09/19/16 1104  TROPONINI 0.03*   BNP (last 3 results)  Recent Labs  05/08/16 1053  PROBNP 1,132*   HbA1C: No results for input(s): HGBA1C in the last 72 hours. CBG:  Recent Labs Lab 09/24/16 1242 09/24/16 1616 09/24/16 2104 09/25/16 0802 09/25/16 1130  GLUCAP 181* 154* 119* 124* 119*   Lipid Profile: No results for input(s): CHOL, HDL, LDLCALC, TRIG, CHOLHDL, LDLDIRECT in the last 72 hours. Thyroid Function Tests: No results for input(s): TSH, T4TOTAL, FREET4, T3FREE, THYROIDAB in the last 72 hours. Anemia Panel: No results for input(s): VITAMINB12, FOLATE, FERRITIN, TIBC, IRON, RETICCTPCT in the last 72 hours. Urine analysis:    Component Value Date/Time   COLORURINE YELLOW 06/08/2016 1647   APPEARANCEUR CLEAR 06/08/2016 1647   LABSPEC 1.006 06/08/2016 1647   PHURINE 8.0 06/08/2016 1647   GLUCOSEU NEGATIVE 06/08/2016 1647   HGBUR NEGATIVE 06/08/2016 1647   BILIRUBINUR NEGATIVE 06/08/2016 1647   KETONESUR NEGATIVE 06/08/2016 1647   PROTEINUR NEGATIVE 06/08/2016 1647   UROBILINOGEN 0.2 01/27/2015 2308   NITRITE NEGATIVE 06/08/2016 1647   LEUKOCYTESUR NEGATIVE 06/08/2016 1647   Sepsis  Labs: @LABRCNTIP (procalcitonin:4,lacticidven:4)  ) Recent Results (from the past 240 hour(s))  MRSA PCR Screening     Status: None   Collection Time: 09/15/16  5:16 PM  Result Value Ref Range Status   MRSA by PCR NEGATIVE NEGATIVE Final    Comment:        The GeneXpert MRSA Assay (FDA approved for NASAL specimens only), is one component of a comprehensive MRSA colonization surveillance program. It is not intended to diagnose MRSA infection nor to guide or monitor treatment for MRSA infections.   Surgical PCR screen     Status: Abnormal   Collection Time: 09/24/16 10:02 AM  Result Value Ref Range Status   MRSA, PCR POSITIVE (A) NEGATIVE Final   Staphylococcus aureus POSITIVE (A) NEGATIVE Final    Comment:        The Xpert SA Assay (FDA approved for NASAL specimens in patients over 40 years of age), is one component of a comprehensive surveillance program.  Test performance has been validated by [facility] for patients greater than or equal to 85 year old. It is not intended to diagnose infection nor to guide or monitor treatment. CRITICAL RESULT CALLED TO, READ BACK BY AND VERIFIED WITH: C. HOLTSON, RN AT 1220 ON 09/24/16 BY C. JESSUP, MLT.      Radiology Studies: No results found.   Scheduled Meds: . atorvastatin  10 mg Oral q1800  . calcium-vitamin D  1 tablet Oral Q breakfast  . carvedilol  12.5 mg Oral BID  . Chlorhexidine Gluconate Cloth  6 each Topical Q0600  . clopidogrel  75 mg Oral Q breakfast  . cycloSPORINE  1 drop Both Eyes BID  . [START ON 09/26/2016] enoxaparin (LOVENOX) injection  1 mg/kg Subcutaneous Q24H  . feeding supplement (NEPRO CARB STEADY)  237 mL Oral BID  BM  . feeding supplement (PRO-STAT SUGAR FREE 64)  30 mL Per Tube BID  . fluticasone  1 spray Each Nare BID  . free water  230 mL Per Tube BID  . gabapentin  600 mg Oral TID  . insulin aspart  0-9 Units Subcutaneous TID WC  . insulin aspart  3 Units Subcutaneous TID WC  . insulin  glargine  18 Units Subcutaneous Daily  . isosorbide mononitrate  30 mg Oral Daily  . latanoprost  1 drop Both Eyes QHS  . levothyroxine  137 mcg Oral QAC breakfast  . mometasone-formoterol  2 puff Inhalation BID  . multivitamin with minerals  1 tablet Oral Daily  . mupirocin ointment  1 application Nasal BID  . nystatin   Topical TID  . polyvinyl alcohol  1 drop Both Eyes QID  . sertraline  100 mg Oral Daily   Continuous Infusions: . sodium chloride    . feeding supplement (OSMOLITE 1.5 CAL) Stopped (09/24/16 1018)     LOS: 10 days    Time spent: 44    Pleas Koch, MD [facility]   If 7PM-7AM, please contact night-coverage [email or web address] Password [email or web address] 09/25/2016, 1:55 PM

## 2016-09-25 NOTE — Op Note (Signed)
OPERATIVE REPORT  DATE OF SURGERY: 09/25/2016  PATIENT: Shannon [name], 83 y.o. female MRN: [id number]  DOB: 02-10-1924  PRE-OPERATIVE DIAGNOSIS: Gangrene left second and third toes  POST-OPERATIVE DIAGNOSIS:  Same  PROCEDURE: Open amputation transmetatarsal of left second and third toes  SURGEON:  Gretta Began, M.D.  PHYSICIAN ASSISTANT: Nurse  ANESTHESIA:  Ankle block  EBL: Minimal ml  Total I/O In: -  Out: 5 [Blood:5]  BLOOD ADMINISTERED: None  DRAINS: None  SPECIMEN: None  COUNTS CORRECT:  YES  PLAN OF CARE: PACU   PATIENT DISPOSITION:  PACU - hemodynamically stable  PROCEDURE DETAILS: The patient was taken to the operative placed supine position where the area of the left foot was prepped in sterile fashion. Patient had gangrenous changes extending down into the second metatarsal head. The second toe had some dry gangrene and was auto amputated. The metatarsal head was completely degenerated from infection and this was resected down to healthy tissue. There was good bleeding. There was exposed bone in the third toe. The metatarsal head of third toe was relatively healthy. The third toe was amputated and the metatarsal head was resected with a rongeur. The tendons and the bone were debrided well back beyond the skin edges. The wound was copious irrigated with saline and hemostasis tablet cautery. The wound was packed with Betadine soaked 4 x 4's and a Kerlix was applied over this. Patient was transferred to the recovery stable condition   Larina Earthly, M.D., [facility] 09/25/2016 11:15 AM

## 2016-09-25 NOTE — Care Management Important Message (Signed)
Important Message  Patient Details  Name: Shannon [name] MRN: [id number] Date of Birth: 1923-11-04   Medicare Important Message Given:  Yes    Mekia Dipinto, Rory Percy, RN 09/25/2016, 12:39 PM

## 2016-09-25 NOTE — Anesthesia Postprocedure Evaluation (Signed)
Anesthesia Post Note  Patient: Shannon [name]  Procedure(s) Performed: Procedure(s) (LRB): AMPUTATION DIGIT- LEFT 2ND AND 3RD TOES (Left)     Patient location during evaluation: PACU Anesthesia Type: MAC and Regional Level of consciousness: awake and alert Pain management: pain level controlled Vital Signs Assessment: post-procedure vital signs reviewed and stable Respiratory status: spontaneous breathing, nonlabored ventilation, respiratory function stable and patient connected to nasal cannula oxygen Cardiovascular status: stable and blood pressure returned to baseline Anesthetic complications: no    Last Vitals:  Vitals:   09/25/16 1158 09/25/16 1214  BP: (!) 153/63 (!) 171/66  Pulse: 61 60  Resp: 16 18  Temp: 36.9 C 36.8 C    Last Pain:  Vitals:   09/25/16 1158  TempSrc:   PainSc: 0-No pain                 Johntavious Francom,JAMES TERRILL

## 2016-09-26 ENCOUNTER — Inpatient Hospital Stay (HOSPITAL_COMMUNITY): Payer: Medicare Other

## 2016-09-26 ENCOUNTER — Encounter (HOSPITAL_COMMUNITY): Payer: Self-pay | Admitting: Vascular Surgery

## 2016-09-26 LAB — CBC WITH DIFFERENTIAL/PLATELET
Basophils Absolute: 0 10*3/uL (ref 0.0–0.1)
Basophils Relative: 0 %
Eosinophils Absolute: 0.3 10*3/uL (ref 0.0–0.7)
Eosinophils Relative: 3 %
HCT: 25.1 % — ABNORMAL LOW (ref 36.0–46.0)
Hemoglobin: 8 g/dL — ABNORMAL LOW (ref 12.0–15.0)
Lymphocytes Relative: 15 %
Lymphs Abs: 1.7 10*3/uL (ref 0.7–4.0)
MCH: 28.6 pg (ref 26.0–34.0)
MCHC: 31.9 g/dL (ref 30.0–36.0)
MCV: 89.6 fL (ref 78.0–100.0)
Monocytes Absolute: 0.9 10*3/uL (ref 0.1–1.0)
Monocytes Relative: 8 %
Neutro Abs: 8.5 10*3/uL — ABNORMAL HIGH (ref 1.7–7.7)
Neutrophils Relative %: 74 %
Platelets: 263 10*3/uL (ref 150–400)
RBC: 2.8 MIL/uL — ABNORMAL LOW (ref 3.87–5.11)
RDW: 18 % — ABNORMAL HIGH (ref 11.5–15.5)
WBC: 11.4 10*3/uL — ABNORMAL HIGH (ref 4.0–10.5)

## 2016-09-26 LAB — COMPREHENSIVE METABOLIC PANEL
ALT: 31 U/L (ref 14–54)
AST: 25 U/L (ref 15–41)
Albumin: 2 g/dL — ABNORMAL LOW (ref 3.5–5.0)
Alkaline Phosphatase: 57 U/L (ref 38–126)
Anion gap: 5 (ref 5–15)
BUN: 35 mg/dL — ABNORMAL HIGH (ref 6–20)
CO2: 25 mmol/L (ref 22–32)
Calcium: 8.3 mg/dL — ABNORMAL LOW (ref 8.9–10.3)
Chloride: 103 mmol/L (ref 101–111)
Creatinine, Ser: 1.22 mg/dL — ABNORMAL HIGH (ref 0.44–1.00)
GFR calc Af Amer: 44 mL/min — ABNORMAL LOW (ref >60)
GFR calc non Af Amer: 38 mL/min — ABNORMAL LOW (ref >60)
Glucose, Bld: 232 mg/dL — ABNORMAL HIGH (ref 65–99)
Potassium: 4.4 mmol/L (ref 3.5–5.1)
Sodium: 133 mmol/L — ABNORMAL LOW (ref 135–145)
Total Bilirubin: 0.4 mg/dL (ref 0.3–1.2)
Total Protein: 5.4 g/dL — ABNORMAL LOW (ref 6.5–8.1)

## 2016-09-26 LAB — GLUCOSE, CAPILLARY
Glucose-Capillary: 213 mg/dL — ABNORMAL HIGH (ref 65–99)
Glucose-Capillary: 190 mg/dL — ABNORMAL HIGH (ref 65–99)
Glucose-Capillary: 109 mg/dL — ABNORMAL HIGH (ref 65–99)
Glucose-Capillary: 120 mg/dL — ABNORMAL HIGH (ref 65–99)

## 2016-09-26 LAB — PROTIME-INR
INR: 1.12
Prothrombin Time: 14.4 seconds (ref 11.4–15.2)

## 2016-09-26 MED ORDER — SODIUM CHLORIDE 0.9 % IV SOLN: Freq: Once | INTRAVENOUS | Status: DC

## 2016-09-26 MED ORDER — MOMETASONE FURO-FORMOTEROL FUM 200-5 MCG/ACT IN AERO: 2.0000 | INHALATION_SPRAY | Freq: Two times a day (BID) | RESPIRATORY_TRACT | 0 refills | Status: DC

## 2016-09-26 MED ORDER — DIPHENHYDRAMINE HCL 25 MG PO CAPS: 25.0000 mg | ORAL_CAPSULE | Freq: Once | ORAL | Status: DC

## 2016-09-26 MED ORDER — WARFARIN SODIUM 5 MG PO TABS
5.0000 mg | ORAL_TABLET | Freq: Once | ORAL | Status: AC
  Administered 2016-09-26: 5 mg via ORAL
  Filled 2016-09-26: qty 1

## 2016-09-26 MED ORDER — NYSTATIN 100000 UNIT/GM EX POWD: Freq: Three times a day (TID) | CUTANEOUS | 0 refills | Status: DC

## 2016-09-26 MED ORDER — FUROSEMIDE 10 MG/ML IJ SOLN
20.0000 mg | Freq: Once | INTRAMUSCULAR | Status: AC
  Administered 2016-09-26: 20 mg via INTRAVENOUS
  Filled 2016-09-26: qty 2

## 2016-09-26 MED ORDER — CLOPIDOGREL BISULFATE 75 MG PO TABS: 75.0000 mg | ORAL_TABLET | Freq: Every day | ORAL | 0 refills | Status: DC

## 2016-09-26 MED ORDER — OXYCODONE-ACETAMINOPHEN 5-325 MG PO TABS: 1.0000 | ORAL_TABLET | ORAL | 0 refills | Status: DC | PRN

## 2016-09-26 MED ORDER — INSULIN GLARGINE 100 UNIT/ML ~~LOC~~ SOLN: 10.0000 [IU] | Freq: Every day | SUBCUTANEOUS | 11 refills | Status: DC

## 2016-09-26 MED ORDER — ACETAMINOPHEN 325 MG PO TABS: 650.0000 mg | ORAL_TABLET | Freq: Once | ORAL | Status: DC

## 2016-09-26 MED ORDER — MORPHINE SULFATE (PF) 2 MG/ML IV SOLN
1.0000 mg | INTRAVENOUS | Status: DC | PRN
  Administered 2016-09-26: 1 mg via INTRAVENOUS
  Filled 2016-09-26: qty 1

## 2016-09-26 NOTE — Progress Notes (Signed)
[place] for Lovenox to warfarin bridge Indication: atrial fibrillation  Allergies  Allergen Reactions  . Penicillins Itching, Rash and Other (See Comments)    Tolerated unasyn, zosyn & cephalosporins in the past.  Has patient had a PCN reaction causing immediate rash, facial/tongue/throat swelling, SOB or lightheadedness with hypotension: Yes Has patient had a PCN reaction causing severe rash involving mucus membranes or skin necrosis: Unk Has patient had a PCN reaction that required hospitalization: Unk Has patient had a PCN reaction occurring within the last 10 years: Unk If all of the above answers are "NO", then may proceed with Cephalosporins  . Other     Patient tolerates amoxicillin and cephalosporins   Patient Measurements: Height: 5\' 6"  (167.6 cm) Weight: 135 lb (61.2 kg) IBW/kg (Calculated) : 59.3  Vital Signs: Temp: 97.7 F (36.5 C) (06/26 0859) Temp Source: Oral (06/26 0859) BP: 134/45 (06/26 0859) Pulse Rate: 63 (06/26 0859)  Labs:  Recent Labs  09/24/16 0529 09/25/16 0524 09/26/16 0435  HGB 8.9* 9.1* 8.0*  HCT 27.9* 27.9* 25.1*  PLT 314 310 263  LABPROT 14.7 13.9 14.4  INR 1.14 1.07 1.12  CREATININE 1.07* 1.03* 1.22*   Estimated Creatinine Clearance: 28.1 mL/min (A) (by C-G formula based on SCr of 1.22 mg/dL (H)).   Assessment: 4 yoF on warfarin PTA for afib, CHad2vasc2 = 6. Warfarin was resumed post-op toe amputations on 6/25. Enoxaparin also resumed today. INR is subtherapeutic at 1.12. No bleeding noted, Hgb loe stable, platelets are normal.  PTA warfarin regimen: 2.5 mg on Mondays and 3.75 mg all other days  Goal of Therapy:  INR 2-3 Monitor platelets by anticoagulation protocol: Yes   Plan:  Warfarin 5 mg PO tonight Lovenox 65 mg SQ q24h  Daily INR, CBC q72h while on Lovenox Monitor for s/sx of bleeding   Renold Genta, PharmD, BCPS Clinical Pharmacist Phone for today - [place] -  [phone] 09/26/2016 11:01 AM

## 2016-09-26 NOTE — Progress Notes (Signed)
SATURATION QUALIFICATIONS: (This note is used to comply with regulatory documentation for home oxygen)  Patient Saturations on Room Air at Rest = 94%  Patient Saturations on Room Air while Ambulating = 84%  Patient Saturations on 2 Liters of oxygen while Ambulating = 94%  Please briefly explain why patient needs home oxygen Dypnea with exertion, improved on 02.

## 2016-09-26 NOTE — Discharge Summary (Addendum)
Physician Discharge Summary  Shannon [name] WUJ:[id number] DOB: 12-Apr-1923 DOA: 09/14/2016  PCP: Kirt Boys, DO  Admit date: 09/14/2016 Discharge date: 09/26/2016  Time spent: 55 minutes  Recommendations for Outpatient Follow-up:  1. New medications this admission-Dulera, Lantus 18--> 10 units, Percocet for postop pain, nystatin for breast fungus, Plavix at least for 3 months given patient had Stent 6/21 2. Have discontinued Lasix this admission completely-please check volume status as an outpatient and weights and clinically determine if she needs further Lasix 3. Patient will need TSH in 3 weeks 4. Patient will discharge home with home health, will need wet-to-dry dressings to left second toe stump which was amputated and has been prescribed a Darco boot to help with ambulation 5. Patient will need home health oxygen with ambulation as desats to 80% without 6. Can resume home feeding schedule with supplements and free water flushes into her PEG tube, during the day can eat liquids diet 7. Can resume palliative care follow-up as an outpatient as per PCP 8. Please get INR, CBC, complete metabolic panel in 1 week  Discharge Diagnoses:  Principal Problem:   COPD exacerbation (HCC) Active Problems:   Diabetes mellitus type 2 in nonobese (HCC)   Chronic kidney insufficiency, stage 3 (moderate)   Accelerated hypertension   Hyponatremia   Chronic diastolic CHF (congestive heart failure) (HCC)   Atherosclerosis of native arteries of the extremities with gangrene (HCC)   Gangrene of toe of left foot (HCC)   Discharge Condition: Fair  Diet recommendation: Heart healthy low-salt diabetic  Filed Weights   09/23/16 0130 09/23/16 2018 09/24/16 2055  Weight: 68.6 kg (151 lb 3.8 oz) 64 kg (141 lb) 61.2 kg (135 lb)    History of present illness:  83 fem CKD III Afib CHad2vasc2 = 6 + Tachybradysyndrome, on coumadin COPD DVT in past CVA Chr L Cerebellear infracts HTN ty II DM GO  Obstrution s/p G tube HLD GERD Diastolic HF hypothryoid Pressure ulcer  This is her 8th recent admit She has been offered pallaitive care in the past as her condition is the sum of her mutiple medical issues issues  prior unwilling to undergo amoputation and wishes to try medication only but now has recanted and is willing for surgery Anxiety has affected her care Patient has stabilized and is scheduled for amputation of toe 6/25 and her vascular surgeon   Hospital Course:  Acute hypoxemic respiratory failure--not on Home O2 COPD exacerbation Left-sided pleural effusion-tiny and non-amenable to [facility] 6/17             ABG 6/17 on venturi mask 50% shows alkalosis--?anxious             weaned steroids IV to prednisone 40mg  6/18 and tapered off 6/20             2 vw CXR 6/21 shows atelectasis--has no fever--not overly concerned re: PNA             albuterol 2.5 q2 prn, Flonase 50 g bid-added Dulera 6/18--Azithromycin  6/18 stopped             Went to 83% with therapy on 6/22. And on 6/26--needs DME oxygen on discharge             Transferred back to telemetry 6/23  Pleuritic CP 6/19             EKG: normal EKG, normal sinus rhythm, normal sinus rhythm, qrs AXIS 0, PR 0.08  Troponin grey zone and reassuring  L foot gangrene Prior DVT Leukocytosis 2/2 de margination from IV solu-medrol , component of gangrene as well  ischemic toe.  S/P left second toe amputation 09/25/2016  She does not have sepsis              No need abx currently-- demarcated dry gangrene              ABI performed 09/24/16 shows 0.86 R, 0.96L              Angio s/p stent 6/21-needs plavix per VVS for 3 months at least ending 12/22/16              INR is subtherapeutic, starting Coumadin but does not necessarily need a long bridge on Lovenox as patient does not have a pacemaker although [place] score is slightly elevated   Dressing changes twice a day to dry dressings , partial weightbearing and V  VS will schedule outpatient follow-up in 2-3 weeks              postop pain control Percocet 5-325 one-to every 3 when necessary  Afib CHad2 Vasc2= 6, usually on Coumadin at home Tachybrady Mod ctrll HTN Diastolic CHF EF 60-65% CAD             Cont Coreg 6.25-->12.5 on 6/20, IMdur 30 xl             Continue Prn hydralazine 25             Lasix stopped 6/18             Lovenox as above              Anemia, normocytic  Has postoperative anemia, 10-->8 also is on Plavix as well as Coumadin now  We'll transfuse 1 unit PRBC, split the unit into 2 and a give Lasix after Get CBC in the morning prior to discharge  GO Obst 09/2015 GERd             Cont Tube feeds daily at bedtime             Can take PO liquids--cont free water 230 ml bid              Cont MirLAX  Dm tY ii C/B NEPHRO/NEUROPATHY Mild DKA resolved quickly 6/18-6/29 and insulin GTT d/c 6/19 am             [no long acting at home] levemir 12 U--15 U changed to Lantus 10 units on discharge             Put back on Ac coverage, sugars 106-119             Cont gabapentin 600 tid  CKD stg III                    basleine GFR anywhere 25-40 and GFR is very stable             Off lasix since 6/18  CVA c Chr Infarcts              hypothryoid             Cont synthroid 137 mcg             TSH in 3 week  Depression, situational             Cont sertraline 100 qd  Moderate malnutrition  Albumin 2.2, will need supplements on discharge   Patient on Plavix and Coumadin, does not necessarily need bridge of Lovenox-->Coumadin beyond tomorrow D/w daughter who understands Partial code     Consultants:   vasc  Procedures:   6.25 PROCEDURE: Open amputation transmetatarsal of left second and third toes 6.21  1.  US guided cannulation of right common femoral artery 2.  Aortogram with left lower extremity runoff 3.  Drug coated balloon angioplasty of left sfa with 5mm impact admiral  Antimicrobials:    Cefepime and flagyll 6/14-->6/17  azithro 6/14-->6/18   Discharge Exam: Vitals:   09/26/16 0611 09/26/16 0859  BP: (!) 143/51 (!) 134/45  Pulse: 65 63  Resp: 18 18  Temp: 98.4 F (36.9 C) 97.7 F (36.5 C)    eomi ncat, no JVD no distress pain seems completely controlled She ambulated without needing pain meds and allow dressing changes without issue Pleasant, no icterus no pallor no bruit No pallor no ict cta Slight coarse breath sounds s1 s2 no m/r/g abd flat-post-op changes, PEG in place Wound examined when RN was dressing deep craterous but clean with clean edges clean floor and base Euthymic pleasant  Discharge Instructions    Current Discharge Medication List    START taking these medications   Details  clopidogrel (PLAVIX) 75 MG tablet Take 1 tablet (75 mg total) by mouth daily with breakfast. Qty: 30 tablet, Refills: 0    insulin glargine (LANTUS) 100 UNIT/ML injection Inject 0.1 mLs (10 Units total) into the skin daily. Qty: 10 mL, Refills: 11    mometasone-formoterol (DULERA) 200-5 MCG/ACT AERO Inhale 2 puffs into the lungs 2 (two) times daily. Qty: 13 g, Refills: 0    nystatin (MYCOSTATIN/NYSTOP) powder Apply topically 3 (three) times daily. Qty: 15 g, Refills: 0    oxyCODONE-acetaminophen (PERCOCET/ROXICET) 5-325 MG tablet Take 1-2 tablets by mouth every 3 (three) hours as needed for moderate pain. Qty: 30 tablet, Refills: 0      CONTINUE these medications which have NOT CHANGED   Details  AMINO ACIDS-PROTEIN HYDROLYS PO Take 30 mLs by mouth 2 (two) times daily.    atorvastatin (LIPITOR) 10 MG tablet TAKE ONE TABLET BY MOUTH ONCE DAILY Qty: 90 tablet, Refills: 1    b complex vitamins tablet Take 1 tablet by mouth daily.     benzonatate (TESSALON PERLES) 100 MG capsule Take two capsules by mouth three times daily as needed for cough Qty: 80 capsule, Refills: 1    calcium-vitamin D (OSCAL WITH D) 500-200 MG-UNIT per tablet Take 1 tablet by  mouth daily with breakfast.     carvedilol (COREG) 6.25 MG tablet Take 1 tablet (6.25 mg total) by mouth 2 (two) times daily. Qty: 60 tablet, Refills: 0    chlorhexidine (PERIDEX) 0.12 % solution Use as directed 15 mLs in the mouth or throat 2 (two) times daily.    feeding supplement (OSMOLITE 1 CAL) LIQD Take 237 mLs by mouth at bedtime.    fluticasone (FLONASE) 50 MCG/ACT nasal spray Place 2 sprays into both nostrils daily. Qty: 16 g, Refills: 3    gabapentin (NEURONTIN) 300 MG capsule 1 tablet by mouth three times daily, may have additional dose every 8 hours as needed pain Qty: 90 capsule, Refills: 0   Associated Diagnoses: Diabetic polyneuropathy associated with type 2 diabetes mellitus (HCC)    insulin aspart (NOVOLOG FLEXPEN) 100 UNIT/ML FlexPen Before each meal 3 times a day, 140-199 - 2 units, 200-250 - 4 units, 251-299 -  6 units,  300-349 - 8 units,  350 or above 10 units. Insulin PEN if approved, provide syringes and needles if needed. Qty: 15 mL, Refills: 0    ipratropium-albuterol (DUONEB) 0.5-2.5 (3) MG/3ML SOLN Take 3 mLs by nebulization every 6 (six) hours as needed (sob). Use 3 times daily x 4 days then every 6 hours as needed. Qty: 360 mL, Refills: 4    isosorbide mononitrate (IMDUR) 30 MG 24 hr tablet Take 30 mg by mouth daily.    latanoprost (XALATAN) 0.005 % ophthalmic solution Place 1 drop into both eyes at bedtime. Qty: 2.5 mL, Refills: 12    levalbuterol (XOPENEX HFA) 45 MCG/ACT inhaler Inhale 2 puffs into the lungs every 6 (six) hours as needed for wheezing or shortness of breath. Qty: 1 Inhaler, Refills: 12    levothyroxine (SYNTHROID, LEVOTHROID) 137 MCG tablet TAKE 1 TABLET (137 MCG TOTAL) BY MOUTH DAILY BEFORE BREAKFAST. Qty: 30 tablet, Refills: 2    Multiple Vitamin (MULTIVITAMIN WITH MINERALS) TABS tablet Take 1 tablet by mouth daily.    Polyethyl Glycol-Propyl Glycol 0.4-0.3 % SOLN Place 1 drop into both eyes 4 (four) times daily.     RESTASIS  0.05 % ophthalmic emulsion USE 1 DROP INTO BOTH EYES TWICE DAILY Qty: 60 mL, Refills: 3    sertraline (ZOLOFT) 100 MG tablet TAKE ONE TABLET BY MOUTH ONCE DAILY FOR DEPRESSION Qty: 90 tablet, Refills: 1    warfarin (COUMADIN) 2.5 MG tablet TAKE AS DIRECTED BY COUMADIN CLINIC. Qty: 40 tablet, Refills: 1    Water For Irrigation, Sterile (FREE WATER) SOLN Place 230 mLs into feeding tube 3 (three) times daily.    acetaminophen (TYLENOL) 500 MG tablet Take 1,000 mg by mouth every 6 (six) hours as needed (pain).    Maltodextrin-Xanthan Gum (RESOURCE THICKENUP CLEAR) POWD Take 120 g by mouth as needed (use to thicken liquids.). Qty: 20 Can, Refills: 0    polyethylene glycol (MIRALAX / GLYCOLAX) packet Take 17 g by mouth daily as needed for mild constipation.      STOP taking these medications     furosemide (LASIX) 20 MG tablet        Allergies  Allergen Reactions  . Penicillins Itching, Rash and Other (See Comments)    Tolerated unasyn, zosyn & cephalosporins in the past.  Has patient had a PCN reaction causing immediate rash, facial/tongue/throat swelling, SOB or lightheadedness with hypotension: Yes Has patient had a PCN reaction causing severe rash involving mucus membranes or skin necrosis: Unk Has patient had a PCN reaction that required hospitalization: Unk Has patient had a PCN reaction occurring within the last 10 years: Unk If all of the above answers are "NO", then may proceed with Cephalosporins  . Other     Patient tolerates amoxicillin and cephalosporins   Follow-up Information    Early, Kristen Loader, MD In 2 weeks.   Specialties:  Vascular Surgery, Cardiology Why:  Our office will call you to arrange an appointment  Contact information: [address] [place] [address] [phone]            The results of significant diagnostics from this hospitalization (including imaging, microbiology, ancillary and laboratory) are listed below for reference.     Significant Diagnostic Studies: Dg Chest 2 View  Result Date: 09/21/2016 CLINICAL DATA:  Cough, shortness of breath today, history of pneumonia EXAM: CHEST  2 VIEW COMPARISON:  Chest x-ray of 09/17/2016 and 09/14/2016 FINDINGS: There has been improvement in opacity at the left lung  base with a small left pleural effusion and mild left basilar atelectasis remaining. Vague opacity remains anteriorly on the lateral view probably in the right upper lobe anteriorly consistent with linear atelectasis or pneumonia. Cardiomegaly is stable. No acute bony abnormality is seen. IMPRESSION: 1. Improved aeration mild left basilar atelectasis and possibly small left effusion. 2. Mild linear atelectasis or residual pneumonia in the anterior right upper lobe. Electronically Signed   By: Dwyane Dee M.D.   On: 09/21/2016 08:01   Dg Chest 2 View  Result Date: 09/14/2016 CLINICAL DATA:  Shortness of breath EXAM: CHEST  2 VIEW COMPARISON:  Aug 16, 2016 FINDINGS: The heart size and mediastinal contours are within normal limits. There is minimal left pleural effusion. There is no focal pneumonia or pulmonary edema. The visualized skeletal structures are unremarkable. IMPRESSION: No focal pneumonia.  Minimal left pleural effusion. Electronically Signed   By: Sherian Rein M.D.   On: 09/14/2016 21:04   [place] Chest  Result Date: 09/17/2016 CLINICAL DATA:  Acute shortness of breath. Pleural effusion by chest x-ray. Request evaluation for possible thoracentesis. EXAM: CHEST ULTRASOUND COMPARISON:  None. FINDINGS: Limited ultrasound of the left chest finds very small partially loculated effusion at the left lung base. There is also consolidated lung. These findings are not consistent with what was suggested on chest x-ray. IMPRESSION: Very small, partially loculated left pleural effusion. Insufficient for thoracentesis. Read by: Brayton El PA-C Electronically Signed   By: [name]   On: 09/17/2016 14:26   Dg Chest  Port 1 View  Result Date: 09/17/2016 CLINICAL DATA:  Respiratory failure EXAM: PORTABLE CHEST 1 VIEW COMPARISON:  September 14, 2016 FINDINGS: No pneumothorax. The right lung is clear. Increasing effusion underlying opacity on the left. Shift of the heart and mediastinum to the left may represent some underlying volume loss is well. No other interval changes. IMPRESSION: Increasing effusion and underlying opacity on the left with possible associated volume loss as above. Recommend follow-up to resolution. Electronically Signed   By: Gerome Sam III M.D   On: 09/17/2016 11:20    Microbiology: Recent Results (from the past 240 hour(s))  Surgical PCR screen     Status: Abnormal   Collection Time: 09/24/16 10:02 AM  Result Value Ref Range Status   MRSA, PCR POSITIVE (A) NEGATIVE Final   Staphylococcus aureus POSITIVE (A) NEGATIVE Final    Comment:        The Xpert SA Assay (FDA approved for NASAL specimens in patients over 38 years of age), is one component of a comprehensive surveillance program.  Test performance has been validated by [facility] for patients greater than or equal to 31 year old. It is not intended to diagnose infection nor to guide or monitor treatment. CRITICAL RESULT CALLED TO, READ BACK BY AND VERIFIED WITH: C. HOLTSON, RN AT 1220 ON 09/24/16 BY C. JESSUP, MLT.      Labs: Basic Metabolic Panel:  Recent Labs Lab 09/20/16 0608 09/22/16 0409 09/24/16 0529 09/25/16 0524 09/26/16 0435  NA 140 141 136 136 133*  K 3.8 4.1 4.2 4.6 4.4  CL 104 106 104 105 103  CO2 28 29 26 25 25   GLUCOSE 358* 298* 277* 106* 232*  BUN 54* 43* 38* 32* 35*  CREATININE 1.05* 0.99 1.07* 1.03* 1.22*  CALCIUM 9.2 8.7* 8.5* 8.7* 8.3*  PHOS  --   --   --  3.5  --    Liver Function Tests:  Recent Labs Lab 09/20/16 [phone]  09/22/16 0409 09/25/16 0524 09/26/16 0435  AST 19 30  --  25  ALT 16 25  --  31  ALKPHOS 75 61  --  57  BILITOT 0.4 0.4  --  0.4  PROT 6.5 5.9*  --  5.4*   ALBUMIN 2.4* 2.2* 2.2* 2.0*   No results for input(s): LIPASE, AMYLASE in the last 168 hours. No results for input(s): AMMONIA in the last 168 hours. CBC:  Recent Labs Lab 09/22/16 0409 09/23/16 0417 09/24/16 0529 09/25/16 0524 09/26/16 0435  WBC 11.4* 14.3* 15.4* 17.2* 11.4*  NEUTROABS  --   --   --   --  8.5*  HGB 10.0* 9.0* 8.9* 9.1* 8.0*  HCT 31.5* 28.3* 27.9* 27.9* 25.1*  MCV 89.5 88.7 89.4 86.6 89.6  PLT 389 352 314 310 263   Cardiac Enzymes: No results for input(s): CKTOTAL, CKMB, CKMBINDEX, TROPONINI in the last 168 hours. BNP: BNP (last 3 results)  Recent Labs  06/08/16 1523 08/12/16 1400 09/14/16 2140  BNP 1,268.6* 92.2 226.1*    ProBNP (last 3 results)  Recent Labs  05/08/16 1053  PROBNP 1,132*    CBG:  Recent Labs Lab 09/25/16 1130 09/25/16 1634 09/25/16 2107 09/26/16 0807 09/26/16 1149  GLUCAP 119* 196* 135* 213* 190*       Signed:  Rhetta Mura MD   [facility] 09/26/2016, 3:42 PM

## 2016-09-26 NOTE — Progress Notes (Signed)
Physical Therapy Treatment Patient Details Name: Shannon [name] MRN: [id number] DOB: 12-26-1923 Today's Date: 09/26/2016    History of Present Illness Pt is a 83 y.o. female admitted on 09/14/2016, 2nd toe L foot with purulent and foul smelling discharge.  Pt. s/p angioplasty of L SFA/popliteal artery on 09/21/16. 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  PMH significant for anemia, arthritis, asthma, bradycardia, CAD< chronic diastolic CHF, CKD, COPD, depression, DVT, GERD, HLD, HTN, hypothyroidism, paraesophageal hernia, perforated stroke, DM, ventral hernia with bowel obstruction.     PT Comments    Patient progressing with ambulation this session able to keep toes up, but feel will do better with heel wedge Darco to protect healing amputation sites.  Patient will have family support at home and reports has w/c as well.  Feel safe for d/c home with family A and HHPT.   Follow Up Recommendations  Home health PT;Supervision/Assistance - 24 hour     Equipment Recommendations  None recommended by PT    Recommendations for Other Services       Precautions / Restrictions Precautions Precautions: Fall Precaution Comments: watch O2 Restrictions Weight Bearing Restrictions: Yes LLE Weight Bearing: Partial weight bearing LLE Partial Weight Bearing Percentage or Pounds: 50    Mobility  Bed Mobility               General bed mobility comments: pt in chair  Transfers Overall transfer level: Needs assistance Equipment used: Rolling walker (2 wheeled) Transfers: Sit to/from Stand Sit to Stand: Min assist;Mod assist         General transfer comment: up from low recliner mod A for lifting due to feet sliding, cues for feet back under her more, from armchair able to stand with min A  Ambulation/Gait Ambulation/Gait assistance: Min assist Ambulation Distance (Feet): 16 Feet (x 3) Assistive device: Rolling walker (2 wheeled) Gait  Pattern/deviations: Step-to pattern;Decreased stride length     General Gait Details: cues for PWB on L heel and step to sequence, spoke with RN/MD about getting heel wedge Darco shoe.     Stairs            Wheelchair Mobility    Modified Rankin (Stroke Patients Only)       Balance Overall balance assessment: Needs assistance Sitting-balance support: Feet supported;No upper extremity supported Sitting balance-Leahy Scale: Good     Standing balance support: Bilateral upper extremity supported Standing balance-Leahy Scale: Poor Standing balance comment: UE support for balance, initial posterior lean due to foot position                            Cognition Arousal/Alertness: Awake/alert Behavior During Therapy: WFL for tasks assessed/performed Overall Cognitive Status: Within Functional Limits for tasks assessed                                        Exercises      General Comments General comments (skin integrity, edema, etc.): dyspnea limiting ambulation, SpO2 on RA at rest 94%, with ambulation on RA 83%, while on 2L O2 94%      Pertinent Vitals/Pain      Home Living                      Prior Function  PT Goals (current goals can now be found in the care plan section) Progress towards PT goals: Progressing toward goals    Frequency           PT Plan Current plan remains appropriate    Co-evaluation              AM-PAC PT "6 Clicks" Daily Activity  Outcome Measure  Difficulty turning over in bed (including adjusting bedclothes, sheets and blankets)?: A Lot Difficulty moving from lying on back to sitting on the side of the bed? : Total Difficulty sitting down on and standing up from a chair with arms (e.g., wheelchair, bedside commode, etc,.)?: Total Help needed moving to and from a bed to chair (including a wheelchair)?: A Lot Help needed walking in hospital room?: A Little Help needed  climbing 3-5 steps with a railing? : A Lot 6 Click Score: 11    End of Session Equipment Utilized During Treatment: Gait belt;Oxygen Activity Tolerance: Patient limited by fatigue Patient left: in chair;with call bell/phone within reach;with nursing/sitter in room   PT Visit Diagnosis: Muscle weakness (generalized) (M62.81);Difficulty in walking, not elsewhere classified (R26.2)     Time: 0981-1914 PT Time Calculation (min) (ACUTE ONLY): 32 min  Charges:  $Gait Training: 23-37 mins                    G CodesSheran [name], Shannon [name] 09/26/2016    Shannon [name] 09/26/2016, 3:21 PM

## 2016-09-26 NOTE — Consult Note (Signed)
[facility] Inpatient Consult   09/26/2016  Shannon [name] Oct 25, 1923 [id number]   Patient was assessed for [facility] Care Management for community services. Patient was previously active with [facility] Care Management.  Met with patient at bedside regarding being restarted with [facility] services.  Spoke to daughter [name] about palliative care. She states she had [facility] that came out once but wasn't sure if they had planned to come back or if she was active still.  Will follow up with inpatient RNCM as well who was not available at the time of the visit. Of note, [facility] Care Management services does not replace or interfere with any services that are arranged by inpatient case management or social work. For additional questions or referrals please contact:  Charlesetta Shanks, RN BSN CCM [facility]  [phone] business mobile phone Toll free office [phone]

## 2016-09-26 NOTE — Progress Notes (Signed)
  [facility] Progress Note  POD 1  Dressing changed this afternoon to open left 2nd and 3rd toe amputation sites. Dressing with serosanguinous drainage. Wound bed is clean without purulence or active drainage or bleeding. Wound bed packed with saline gauze. Continue twice daily wet to dry dressings. Partial weight bearing left heel. [place] for SNF from vascular standpoint. We will arrange follow-up in next 2-3 weeks.   Virgina Jock, PA-C [facility] Office: [phone] Pager: [phone] 09/26/2016 1:00 PM

## 2016-09-26 NOTE — Progress Notes (Signed)
Up to chair, no chair alarm available.

## 2016-09-26 NOTE — Care Management Note (Signed)
Case Management Note  Patient Details  Name: Shannon [name] MRN: [id number] Date of Birth: 20-Dec-1923  Subjective/Objective:    CM following for progression and d/c planning.                 Action/Plan: 09/26/2016 Met with pt and daughter who provides care at home. Pt is active with [facility] for [facility] services and pt daughter wishes to continue with [facility]. Noted orders for [facility] and [place] notified. Also noted order for home oxygen. [place] notified of oxygen need and qualifying sats. Pt with hx of COPD.  Pt has no other DME need, she has a walker, cane, 3:1 and tub seat.   Expected Discharge Date:  09/27/16               Expected Discharge Plan:  [place]  In-House Referral:  Clinical Social Work  Discharge planning Services  CM Consult  Post Acute Care Choice:  Home Health Choice offered to:  Adult Children  DME Arranged:  N/A DME Agency:  NA  HH Arranged:  RN, PT OT and aide.  [place] Agency:  [place]  Status of Service:  In process, will continue to follow  If discussed at Long Length of Stay Meetings, dates discussed:    Additional Comments:  Adron Bene, RN 09/26/2016, 4:31 PM

## 2016-09-26 NOTE — Progress Notes (Signed)
Patient ID: Shannon [name], female   DOB: 09-01-1923, 83 y.o.   MRN: [id number] Comfortable this morning. Reports mild soreness in foot. Dressing intact. Will remove later today and begin normal saline wet-to-dry daily dressings to open second and third toe amputation. Okay for discharge with local wound care from vascular standpoint.

## 2016-09-26 NOTE — Progress Notes (Signed)
Orthopedic Tech Progress Note Patient Details:  Shannon [name] 1923/11/14 [id number]  Ortho Devices Type of Ortho Device: Darco shoe Ortho Device/Splint Location: LLE Ortho Device/Splint Interventions: Ordered, Application RN called for Darco shoe.  Braulio Bosch 09/26/2016, 3:32 PM

## 2016-09-27 LAB — GLUCOSE, CAPILLARY
Glucose-Capillary: 217 mg/dL — ABNORMAL HIGH (ref 65–99)
Glucose-Capillary: 203 mg/dL — ABNORMAL HIGH (ref 65–99)

## 2016-09-27 LAB — PROTIME-INR
INR: 1.09
Prothrombin Time: 14.1 seconds (ref 11.4–15.2)

## 2016-09-27 MED ORDER — WARFARIN SODIUM 5 MG PO TABS: 5.0000 mg | ORAL_TABLET | Freq: Once | ORAL | Status: DC

## 2016-09-27 NOTE — Discharge Summary (Signed)
Shannon [name], is a 83 y.o. female  DOB Jan 29, 1924  MRN [id number].  Admission date:  09/14/2016  Admitting Physician  Hillary Bow, DO  Discharge Date:  09/27/2016   Primary MD  Kirt Boys, DO  Recommendations for primary care physician for things to follow:    1. New medications this admission-Dulera, Lantus 18--> 10 units, Percocet for postop pain, nystatin for breast fungus, Plavix at least for 3 months given patient had Stent 6/21 2. Have discontinued Lasix this admission completely-please check volume status as an outpatient and weights and clinically determine if she needs further Lasix 3. Patient will need TSH in 3 weeks 4. Patient will discharge home with home health, will need wet-to-dry dressings to left second toe stump which was amputated and has been prescribed a Darco boot to help with ambulation 5. Patient will need home health oxygen with ambulation as desats to 80% without 6. Can resume home feeding schedule with supplements and free water flushes into her PEG tube, during the day can eat liquids diet 7. Can resume palliative care follow-up as an outpatient as per PCP 8. Please get INR, CBC, complete metabolic panel in 1 week    Admission Diagnosis  Gangrene (HCC) [I96] COPD exacerbation (HCC) [J44.1]   Discharge Diagnosis  Gangrene (HCC) [I96] COPD exacerbation (HCC) [J44.1]    Principal Problem:   COPD exacerbation (HCC) Active Problems:   Diabetes mellitus type 2 in nonobese (HCC)   Chronic kidney insufficiency, stage 3 (moderate)   Accelerated hypertension   Hyponatremia   Chronic diastolic CHF (congestive heart failure) (HCC)   Atherosclerosis of native arteries of the extremities with gangrene (HCC)   Gangrene of toe of left foot (HCC)      Past Medical History:  Diagnosis Date  . Anemia    previous blood transfusions  . Arthritis    "all over"  . Asthma    . Bradycardia    requiring previous d/c of BB and reduction of amiodarone  . CAD (coronary artery disease)    nonobstructive per notes  . Chronic diastolic CHF (congestive heart failure) (HCC)   . CKD (chronic kidney disease), stage III   . Complication of blood transfusion    "got the wrong blood type at [facility] in ~ 2015; no adverse reaction that we are aware of"/daughter, Shannon [name] (01/27/2016)  . COPD (chronic obstructive pulmonary disease) (HCC)   . Depression    "light case"  . DVT (deep venous thrombosis) (HCC) 01/2016   a. LLE DVT 01/2016 - switched from Eliquis to Coumadin.  . Gastric stenosis    a. s/p stomach tube  . GERD (gastroesophageal reflux disease)   . History of blood transfusion    "several" (01/27/2016)  . History of stomach ulcers   . Hyperlipidemia   . Hypertension   . Hypothyroidism   . Paraesophageal hernia   . Perforated gastric ulcer (HCC)   . Seasonal allergies   . SIADH (syndrome of inappropriate ADH production) (HCC)    /  notes 01/10/2015  . Small bowel obstruction (HCC)    "I don't know how many" (01/11/2015)  . Stroke (HCC)    "light one"  . Type II diabetes mellitus (HCC)    "related to prednisone use  for > 20 yr; once predinose stopped; no more DM RX" (01/27/2016)  . Ventral hernia with bowel obstruction     Past Surgical History:  Procedure Laterality Date  . ABDOMINAL AORTOGRAM N/A 09/21/2016   Procedure: Abdominal Aortogram;  Surgeon: Maeola Harman, MD;  Location: [facility] INVASIVE CV LAB;  Service: Cardiovascular;  Laterality: N/A;  . AMPUTATION Left 09/25/2016   Procedure: AMPUTATION DIGIT- LEFT 2ND AND 3RD TOES;  Surgeon: Larina Earthly, MD;  Location: [facility] OR;  Service: Vascular;  Laterality: Left;  . CATARACT EXTRACTION W/ INTRAOCULAR LENS  IMPLANT, BILATERAL    . CHOLECYSTECTOMY OPEN    . COLECTOMY    . ESOPHAGOGASTRODUODENOSCOPY N/A 01/19/2014   Procedure: ESOPHAGOGASTRODUODENOSCOPY (EGD);  Surgeon: Hilarie Fredrickson, MD;   Location: [facility] ENDOSCOPY;  Service: Endoscopy;  Laterality: N/A;  . ESOPHAGOGASTRODUODENOSCOPY N/A 01/20/2014   Procedure: ESOPHAGOGASTRODUODENOSCOPY (EGD);  Surgeon: Hilarie Fredrickson, MD;  Location: [facility] ENDOSCOPY;  Service: Endoscopy;  Laterality: N/A;  . ESOPHAGOGASTRODUODENOSCOPY N/A 03/19/2014   Procedure: ESOPHAGOGASTRODUODENOSCOPY (EGD);  Surgeon: Rachael Fee, MD;  Location: [facility] ENDOSCOPY;  Service: Endoscopy;  Laterality: N/A;  . ESOPHAGOGASTRODUODENOSCOPY N/A 07/08/2015   Procedure: ESOPHAGOGASTRODUODENOSCOPY (EGD);  Surgeon: Sherrilyn Rist, MD;  Location: [facility] ENDOSCOPY;  Service: Endoscopy;  Laterality: N/A;  . ESOPHAGOGASTRODUODENOSCOPY (EGD) WITH PROPOFOL N/A 09/15/2015   Procedure: ESOPHAGOGASTRODUODENOSCOPY (EGD) WITH PROPOFOL;  Surgeon: Ruffin Frederick, MD;  Location: [facility] ENDOSCOPY;  Service: Gastroenterology;  Laterality: N/A;  . GASTROJEJUNOSTOMY     hx/notes 01/10/2015  . GASTROJEJUNOSTOMY N/A 09/23/2015   Procedure: OPEN GASTROJEJUNOSTOMY TUBE PLACEMENT;  Surgeon: De Blanch Kinsinger, MD;  Location: [facility] ORS;  Service: General;  Laterality: N/A;  . GLAUCOMA SURGERY Bilateral   . HERNIA REPAIR  2015  . IR GENERIC HISTORICAL  01/07/2016   IR GJ TUBE CHANGE 01/07/2016 Malachy Moan, MD [facility]INTER[facility] RAD  . IR GENERIC HISTORICAL  01/27/2016   IR MECH REMOV OBSTRUC MAT ANY COLON TUBE W/FLUORO 01/27/2016 Richarda Overlie, MD [facility]  . IR GENERIC HISTORICAL  02/07/2016   IR PATIENT EVAL TECH 0-60 MINS Darrell K Allred, PA-C [facility]  . IR GENERIC HISTORICAL  02/08/2016   IR GJ TUBE CHANGE 02/08/2016 Berdine Dance, MD [facility]  . IR GENERIC HISTORICAL  01/06/2016   IR GJ TUBE CHANGE 01/06/2016 [facility]-RAD OUT REF  . IR GENERIC HISTORICAL  05/02/2016   IR CM INJ ANY COLONIC TUBE W/FLUORO 05/02/2016 Oley Balm, MD [facility]  . IR GENERIC HISTORICAL  05/15/2016   IR GJ TUBE CHANGE 05/15/2016 Simonne Come, MD [facility]  . IR GENERIC HISTORICAL  06/28/2016   IR GJ TUBE CHANGE  06/28/2016 [facility]  . LAPAROTOMY N/A 01/20/2015   Procedure: EXPLORATORY LAPAROTOMY;  Surgeon: Abigail Miyamoto, MD;  Location: [facility] OR;  Service: General;  Laterality: N/A;  . LOWER EXTREMITY ANGIOGRAPHY Left 09/21/2016   Procedure: Lower Extremity Angiography;  Surgeon: Maeola Harman, MD;  Location: [facility] INVASIVE CV LAB;  Service: Cardiovascular;  Laterality: Left;  . LYSIS OF ADHESION N/A 01/20/2015   Procedure: LYSIS OF ADHESIONS < 1 HOUR;  Surgeon: Abigail Miyamoto, MD;  Location: [facility] OR;  Service: General;  Laterality: N/A;  . PERIPHERAL VASCULAR BALLOON ANGIOPLASTY Left 09/21/2016   Procedure: Peripheral Vascular Balloon Angioplasty;  Surgeon: Maeola Harman, MD;  Location: [facility] INVASIVE CV LAB;  Service: Cardiovascular;  Laterality: Left;  drug coated balloon  . TONSILLECTOMY    . TUBAL LIGATION    . VENTRAL HERNIA REPAIR  2015   incarcerated ventral hernia ([facility] 09/2013)/notes 01/10/2015       HPI  from the history and physical done on the day of admission:   89 fem CKD III Afib CHad2vasc2 = 6 + Tachybradysyndrome, on coumadin COPD DVT in past CVA Chr L Cerebellear infracts HTN ty II DM GO Obstrution s/p G tube HLD GERD Diastolic HF hypothryoid Pressure ulcer  This is her 8th recent admit She has been offered pallaitive care in the past as her condition is the sum of her mutiple medical issues issues  prior unwilling to undergo amoputation and wishes to try medication only but now has recanted and is willing for surgery Anxiety has affected her care Patient has stabilized and is scheduled for amputation of toe 6/25 and her vascular surgeon       Hospital Course:      Acute hypoxemic respiratory failure--not on Home O2 COPD exacerbation Left-sided pleural effusion-tiny and non-amenable to [facility] 6/17 ABG 6/17 on venturi mask 50% shows alkalosis--?anxious weaned steroids IV to prednisone 40mg  6/18 and tapered off  6/20 2 vw CXR 6/21 shows atelectasis--has no fever--not overly concerned re: PNA albuterol 2.5 q2 prn, Flonase 50 g bid-added Dulera 6/18--Azithromycin 6/18 stopped Went to 83% with therapy on 6/22. And on 6/26--needs DME oxygen on discharge Transferred back to telemetry 6/23  Pleuritic CP 6/19 EKG: normal EKG, normal sinus rhythm, normal sinus rhythm, qrs AXIS 0, PR 0.08 Troponin grey zone and reassuring  L foot gangrene Prior DVT Leukocytosis 2/2 de margination from IV solu-medrol , component of gangrene as well  ischemic toe.  S/P left second toe amputation06/25/2018  She does not have sepsis  No need abx currently-- demarcated dry gangrene  ABI performed 09/24/16 shows 0.86 R, 0.96L  Angio s/p stent 6/21-needs plavix per VVS for 3 months at least ending 12/22/16  INR is subtherapeutic, starting Coumadin but does not necessarily need a long bridge on Lovenox as patient does not have a pacemaker although [place] score is slightly elevated              Dressing changes twice a day to dry dressings , partial weightbearing and V VS will schedule outpatient follow-up in 2-3 weeks  postop pain control Percocet 5-325 one-to every 3 when necessary  Afib CHad2 Vasc2= 6, usually on Coumadin at home Tachybrady Mod ctrll HTN Diastolic CHF EF 60-65% CAD Cont Coreg 6.25-->12.5 on 6/20, IMdur 30 xl Continue Prn hydralazine 25 Lasix stopped 6/18 Lovenox as above  Anemia, normocytic             Has postoperative anemia, 10-->8 also is on Plavix as well as Coumadin now             We'll transfuse 1 unit PRBC, split the unit into 2 and a give Lasix after Get CBC in the morning prior to discharge  GO Obst 09/2015 GERd Cont Tube feeds daily at bedtime Can take PO liquids--cont  free water 230 ml bid  Cont MirLAX  Dm tY ii C/B NEPHRO/NEUROPATHY Mild DKA resolved quickly 6/18-6/29 and insulin GTT d/c 6/19 am [no long acting at home] levemir 12 U--15 U changed to Lantus 10 units on discharge Put back on Ac coverage, sugars 106-119 Cont gabapentin 600 tid  CKD stg  III basleine GFR anywhere 25-40and GFR is very stable Off lasix since 6/18  CVA c Chr Infarcts  hypothryoid Cont synthroid 137 mcg TSH in 3 week  Depression, situational Cont sertraline 100 qd  Moderate malnutrition Albumin 2.2, will need supplements on discharge   Patient on Plavix and Coumadin, does not necessarily need bridge of Lovenox-->Coumadin beyond tomorrow D/w daughter who understands Partial code     Consultants:  vasc  Procedures:  6.25 PROCEDURE: Open amputation transmetatarsal of left second and third toes 6.21  1. US guided cannulation of right common femoral artery 2. Aortogram with left lower extremity runoff 3. Drug coated balloon angioplasty of left sfa with 5mm impact admiral  Antimicrobials:   Cefepime and flagyll 6/14-->6/17  azithro 6/14-->6/18   Discharge Exam:     Vitals:   09/26/16 0611 09/26/16 0859  BP: (!) 143/51 (!) 134/45  Pulse: 65 63  Resp: 18 18  Temp: 98.4 F (36.9 C) 97.7 F (36.5 C)    eomi ncat, no JVD no distress pain seems completely controlled She ambulated without needing pain meds and allow dressing changes without issue Pleasant, no icterus no pallor no bruit No pallor no ict cta Slight coarse breath sounds s1 s2 no m/r/g abd flat-post-op changes, PEG in place Wound examined when RN was dressing deep craterous but clean with clean edges clean floor and base Euthymic pleasant  Follow UP  Follow-up Information    Early, Kristen Loader, MD In 2 weeks.   Specialties:  Vascular  Surgery, Cardiology Why:  Our office will call you to arrange an appointment  Contact information: [address] [place] [address] [phone]            Consults obtained - vascular surgery  Discharge Condition: stable  Diet and Activity recommendation: See Discharge Instructions below  Discharge Instructions     Discharge Instructions    Diet - low sodium heart healthy    Complete by:  As directed    Discharge instructions    Complete by:  As directed    Patient will need outpatient home health as arranged Patient should follow up with vascular surgery as an outpatient Patient will need oxygen 2 L at baseline Patient should have lab work at regular physician's office or with home health and reported to physician's office in about one week Pain control medications have been prescribed to the patient Please notice some medications have changed on the medical list and some mild and discontinued We will ask palliative care to check in on her as an outpatient Do not take anymore Lasix at home Keep the boot on the foot every time he ambulate and make sure you only placed 50% weight on that area until you see the surgeon and continued twice daily wet-to-dry dressings   Increase activity slowly    Complete by:  As directed         Discharge Medications     Allergies as of 09/27/2016      Reactions   Penicillins Itching, Rash, Other (See Comments)   Tolerated unasyn, zosyn & cephalosporins in the past.  Has patient had a PCN reaction causing immediate rash, facial/tongue/throat swelling, SOB or lightheadedness with hypotension: Yes Has patient had a PCN reaction causing severe rash involving mucus membranes or skin necrosis: Unk Has patient had a PCN reaction that required hospitalization: Unk Has patient had a PCN reaction occurring within the last 10 years: Unk If all of the above answers are "NO", then may  proceed with Cephalosporins   Other    Patient tolerates  amoxicillin and cephalosporins      Medication List    STOP taking these medications   furosemide 20 MG tablet Commonly known as:  LASIX     TAKE these medications   acetaminophen 500 MG tablet Commonly known as:  TYLENOL Take 1,000 mg by mouth every 6 (six) hours as needed (pain).   AMINO ACIDS-PROTEIN HYDROLYS PO Take 30 mLs by mouth 2 (two) times daily.   atorvastatin 10 MG tablet Commonly known as:  LIPITOR TAKE ONE TABLET BY MOUTH ONCE DAILY   b complex vitamins tablet Take 1 tablet by mouth daily.   benzonatate 100 MG capsule Commonly known as:  TESSALON PERLES Take two capsules by mouth three times daily as needed for cough   calcium-vitamin D 500-200 MG-UNIT tablet Commonly known as:  OSCAL WITH D Take 1 tablet by mouth daily with breakfast.   carvedilol 6.25 MG tablet Commonly known as:  COREG Take 1 tablet (6.25 mg total) by mouth 2 (two) times daily.   chlorhexidine 0.12 % solution Commonly known as:  PERIDEX Use as directed 15 mLs in the mouth or throat 2 (two) times daily.   clopidogrel 75 MG tablet Commonly known as:  PLAVIX Take 1 tablet (75 mg total) by mouth daily with breakfast.   feeding supplement Liqd Take 237 mLs by mouth at bedtime.   fluticasone 50 MCG/ACT nasal spray Commonly known as:  FLONASE Place 2 sprays into both nostrils daily. What changed:  how much to take  when to take this   free water Soln Place 230 mLs into feeding tube 3 (three) times daily.   gabapentin 300 MG capsule Commonly known as:  NEURONTIN 1 tablet by mouth three times daily, may have additional dose every 8 hours as needed pain What changed:  how much to take  how to take this  when to take this  additional instructions   insulin aspart 100 UNIT/ML FlexPen Commonly known as:  NOVOLOG FLEXPEN Before each meal 3 times a day, 140-199 - 2 units, 200-250 - 4 units, 251-299 - 6 units,  300-349 - 8 units,  350 or above 10 units. Insulin PEN if  approved, provide syringes and needles if needed.   insulin glargine 100 UNIT/ML injection Commonly known as:  LANTUS Inject 0.1 mLs (10 Units total) into the skin daily.   ipratropium-albuterol 0.5-2.5 (3) MG/3ML Soln Commonly known as:  DUONEB Take 3 mLs by nebulization every 6 (six) hours as needed (sob). Use 3 times daily x 4 days then every 6 hours as needed.   isosorbide mononitrate 30 MG 24 hr tablet Commonly known as:  IMDUR Take 30 mg by mouth daily.   latanoprost 0.005 % ophthalmic solution Commonly known as:  XALATAN Place 1 drop into both eyes at bedtime.   levalbuterol 45 MCG/ACT inhaler Commonly known as:  XOPENEX HFA Inhale 2 puffs into the lungs every 6 (six) hours as needed for wheezing or shortness of breath.   levothyroxine 137 MCG tablet Commonly known as:  SYNTHROID, LEVOTHROID TAKE 1 TABLET (137 MCG TOTAL) BY MOUTH DAILY BEFORE BREAKFAST.   mometasone-formoterol 200-5 MCG/ACT Aero Commonly known as:  DULERA Inhale 2 puffs into the lungs 2 (two) times daily.   multivitamin with minerals Tabs tablet Take 1 tablet by mouth daily.   nystatin powder Commonly known as:  MYCOSTATIN/NYSTOP Apply topically 3 (three) times daily.   oxyCODONE-acetaminophen 5-325 MG tablet Commonly known  as:  PERCOCET/ROXICET Take 1-2 tablets by mouth every 3 (three) hours as needed for moderate pain.   Polyethyl Glycol-Propyl Glycol 0.4-0.3 % Soln Place 1 drop into both eyes 4 (four) times daily.   polyethylene glycol packet Commonly known as:  MIRALAX / GLYCOLAX Take 17 g by mouth daily as needed for mild constipation.   RESOURCE THICKENUP CLEAR Powd Take 120 g by mouth as needed (use to thicken liquids.).   RESTASIS 0.05 % ophthalmic emulsion Generic drug:  cycloSPORINE USE 1 DROP INTO BOTH EYES TWICE DAILY   sertraline 100 MG tablet Commonly known as:  ZOLOFT TAKE ONE TABLET BY MOUTH ONCE DAILY FOR DEPRESSION   warfarin 2.5 MG tablet Commonly known as:   COUMADIN TAKE AS DIRECTED BY COUMADIN CLINIC. What changed:  See the new instructions.            Durable Medical Equipment        Start     Ordered   09/26/16 1555  DME Oxygen  Once    Question Answer Comment  Mode or (Route) Nasal cannula   Liters per Minute 2   Frequency Continuous (stationary and portable oxygen unit needed)   Oxygen conserving device No   Oxygen delivery system Gas      09/26/16 1601   09/26/16 1513  For home use only DME oxygen  Once    Question Answer Comment  Mode or (Route) Nasal cannula   Liters per Minute 2   Frequency Continuous (stationary and portable oxygen unit needed)   Oxygen conserving device No   Oxygen delivery system Gas      09/26/16 1512      Major procedures and Radiology Reports - PLEASE review detailed and final reports for all details, in brief -      Dg Chest 2 View  Result Date: 09/26/2016 CLINICAL DATA:  Cough and shortness of breath. History of pneumonia. EXAM: CHEST  2 VIEW COMPARISON:  With 09/21/2016 and prior exam FINDINGS: Cardiomegaly, small left pleural effusion and left lower lung consolidation/atelectasis again noted. Minimal atelectasis within the right anterior lung again noted. Right basilar atelectasis is unchanged. There is no evidence of pneumothorax. No acute bony abnormalities are identified. IMPRESSION: Unchanged appearance of the chest with small left pleural effusion, left lower lung consolidation/atelectasis and right lung atelectasis. Electronically Signed   By: Harmon Pier M.D.   On: 09/26/2016 19:02   Dg Chest 2 View  Result Date: 09/21/2016 CLINICAL DATA:  Cough, shortness of breath today, history of pneumonia EXAM: CHEST  2 VIEW COMPARISON:  Chest x-ray of 09/17/2016 and 09/14/2016 FINDINGS: There has been improvement in opacity at the left lung base with a small left pleural effusion and mild left basilar atelectasis remaining. Vague opacity remains anteriorly on the lateral view probably in the  right upper lobe anteriorly consistent with linear atelectasis or pneumonia. Cardiomegaly is stable. No acute bony abnormality is seen. IMPRESSION: 1. Improved aeration mild left basilar atelectasis and possibly small left effusion. 2. Mild linear atelectasis or residual pneumonia in the anterior right upper lobe. Electronically Signed   By: Dwyane Dee M.D.   On: 09/21/2016 08:01   Dg Chest 2 View  Result Date: 09/14/2016 CLINICAL DATA:  Shortness of breath EXAM: CHEST  2 VIEW COMPARISON:  Aug 16, 2016 FINDINGS: The heart size and mediastinal contours are within normal limits. There is minimal left pleural effusion. There is no focal pneumonia or pulmonary edema. The visualized skeletal structures are unremarkable. IMPRESSION:  No focal pneumonia.  Minimal left pleural effusion. Electronically Signed   By: Sherian Rein M.D.   On: 09/14/2016 21:04   [place] Chest  Result Date: 09/17/2016 CLINICAL DATA:  Acute shortness of breath. Pleural effusion by chest x-ray. Request evaluation for possible thoracentesis. EXAM: CHEST ULTRASOUND COMPARISON:  None. FINDINGS: Limited ultrasound of the left chest finds very small partially loculated effusion at the left lung base. There is also consolidated lung. These findings are not consistent with what was suggested on chest x-ray. IMPRESSION: Very small, partially loculated left pleural effusion. Insufficient for thoracentesis. Read by: Brayton El PA-C Electronically Signed   By: [name]   On: 09/17/2016 14:26   Dg Chest Port 1 View  Result Date: 09/17/2016 CLINICAL DATA:  Respiratory failure EXAM: PORTABLE CHEST 1 VIEW COMPARISON:  September 14, 2016 FINDINGS: No pneumothorax. The right lung is clear. Increasing effusion underlying opacity on the left. Shift of the heart and mediastinum to the left may represent some underlying volume loss is well. No other interval changes. IMPRESSION: Increasing effusion and underlying opacity on the left with possible  associated volume loss as above. Recommend follow-up to resolution. Electronically Signed   By: Gerome Sam III M.D   On: 09/17/2016 11:20    Micro Results     Recent Results (from the past 240 hour(s))  Surgical PCR screen     Status: Abnormal   Collection Time: 09/24/16 10:02 AM  Result Value Ref Range Status   MRSA, PCR POSITIVE (A) NEGATIVE Final   Staphylococcus aureus POSITIVE (A) NEGATIVE Final    Comment:        The Xpert SA Assay (FDA approved for NASAL specimens in patients over 59 years of age), is one component of a comprehensive surveillance program.  Test performance has been validated by [facility] for patients greater than or equal to 88 year old. It is not intended to diagnose infection nor to guide or monitor treatment. CRITICAL RESULT CALLED TO, READ BACK BY AND VERIFIED WITH: C. HOLTSON, RN AT 1220 ON 09/24/16 BY C. JESSUP, MLT.        Today   Subjective    Shannon [name] today has been feeling better.  Denies dyspnea.   no headache,no chest abdominal pain,no new weakness tingling or numbness, feels much better wants to go home today.    Objective   Blood pressure (!) 159/54, pulse 63, temperature 98.6 F (37 C), temperature source Oral, resp. rate 18, height 5\' 6"  (1.676 m), weight 61.2 kg (135 lb), SpO2 98 %.   Intake/Output Summary (Last 24 hours) at 09/27/16 1250 Last data filed at 09/27/16 0600  Gross per 24 hour  Intake          2094.92 ml  Output              500 ml  Net          1594.92 ml    Exam Awake Alert, Oriented x 3, No new F.N deficits, Normal affect [place].AT,PERRAL Supple Neck,No JVD, No cervical lymphadenopathy appriciated.  Symmetrical Chest wall movement, Good air movement bilaterally, CTAB RRR,No Gallops,Rubs or new Murmurs, No Parasternal Heave +ve B.Sounds, Abd Soft, Non tender, No organomegaly appriciated, No rebound -guarding or rigidity. No Cyanosis, Clubbing or edema, No new Rash or bruise   Data Review    CBC w Diff:  Lab Results  Component Value Date   WBC 11.4 (H) 09/26/2016   HGB 8.0 (L) 09/26/2016   HGB  10.5 (L) 06/27/2016   HCT 25.1 (L) 09/26/2016   HCT 32.0 (L) 06/27/2016   PLT 263 09/26/2016   PLT 368 06/27/2016   LYMPHOPCT 15 09/26/2016   MONOPCT 8 09/26/2016   EOSPCT 3 09/26/2016   BASOPCT 0 09/26/2016    CMP:  Lab Results  Component Value Date   NA 133 (L) 09/26/2016   NA 133 (A) 06/30/2016   K 4.4 09/26/2016   CL 103 09/26/2016   CO2 25 09/26/2016   BUN 35 (H) 09/26/2016   BUN 59 (A) 06/30/2016   CREATININE 1.22 (H) 09/26/2016   CREATININE 1.20 (H) 08/07/2016   GLU 91 06/30/2016   PROT 5.4 (L) 09/26/2016   PROT 7.1 10/06/2015   ALBUMIN 2.0 (L) 09/26/2016   ALBUMIN 3.5 10/06/2015   BILITOT 0.4 09/26/2016   BILITOT 0.2 10/06/2015   ALKPHOS 57 09/26/2016   AST 25 09/26/2016   ALT 31 09/26/2016  .   Total Time in preparing paper work, data evaluation and todays exam - 35 minutes  Pearson Grippe M.D on 09/27/2016 at 12:50 PM  [facility]   Office  [phone]

## 2016-09-27 NOTE — Progress Notes (Signed)
Heel protector allevyn pads applied to bilateral heels at pt and daughter request. No skin breakdown or pressure injury noted.

## 2016-09-27 NOTE — Progress Notes (Signed)
Shannon [name]  [name], RN        Coo-pay amount for Lovenox (enoxaparin ) 60mg . $3.31 per 30days.  supply.no-precert required.

## 2016-09-27 NOTE — Progress Notes (Signed)
Discharge instructions and RX reviewed with pt and family verb understanding. Pt left via wheelchair with all belongings and family at side. Pt in NAD

## 2016-09-27 NOTE — Progress Notes (Signed)
[facility] for Lovenox to warfarin bridge Indication: atrial fibrillation  Allergies  Allergen Reactions  . Penicillins Itching, Rash and Other (See Comments)    Tolerated unasyn, zosyn & cephalosporins in the past.  Has patient had a PCN reaction causing immediate rash, facial/tongue/throat swelling, SOB or lightheadedness with hypotension: Yes Has patient had a PCN reaction causing severe rash involving mucus membranes or skin necrosis: Unk Has patient had a PCN reaction that required hospitalization: Unk Has patient had a PCN reaction occurring within the last 10 years: Unk If all of the above answers are "NO", then may proceed with Cephalosporins  . Other     Patient tolerates amoxicillin and cephalosporins   Patient Measurements: Height: 5\' 6"  (167.6 cm) Weight: 135 lb (61.2 kg) IBW/kg (Calculated) : 59.3  Vital Signs: Temp: 98.6 F (37 C) (06/27 0843) Temp Source: Oral (06/27 0843) BP: 159/54 (06/27 0843) Pulse Rate: 63 (06/27 0843)  Labs:  Recent Labs  09/25/16 0524 09/26/16 0435 09/27/16 0942  HGB 9.1* 8.0*  --   HCT 27.9* 25.1*  --   PLT 310 263  --   LABPROT 13.9 14.4 14.1  INR 1.07 1.12 1.09  CREATININE 1.03* 1.22*  --    Estimated Creatinine Clearance: 28.1 mL/min (A) (by C-G formula based on SCr of 1.22 mg/dL (H)).   Assessment: 31 yoF on warfarin PTA for afib, CHad2vasc2 = 6. Warfarin was resumed post-op toe amputations on 6/25. Lovenox also continued. INR is subtherapeutic at 1.09. No bleeding noted, Hgb low stable, platelets are normal.  PTA warfarin regimen: 2.5 mg on Mondays and 3.75 mg all other days  Goal of Therapy:  INR 2-3 Monitor platelets by anticoagulation protocol: Yes   Plan:  Warfarin 5 mg PO tonight Lovenox 65 mg SQ q24h  Daily INR, CBC q72h while on Lovenox Monitor for s/sx of bleeding Upon discharge, would recommend warfarin 5 mg PO tonight then resume home dosing regimen and close f/u with  warfarin clinic; CM also checking benefits for Lovenox 60 mg SQ q24h   Renold Genta, PharmD, BCPS Clinical Pharmacist Phone for today - [place] - [phone] 09/27/2016 11:07 AM

## 2016-09-27 NOTE — Progress Notes (Signed)
Physical Therapy Treatment Patient Details Name: Shannon [name] MRN: [id number] DOB: [birth year] Today's Date: 09/27/2016    History of Present Illness Pt is a 83 y.o. female admitted on 09/14/2016, 2nd toe L foot with purulent and foul smelling discharge.  Pt. s/p angioplasty of L SFA/popliteal artery on 09/21/16. 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  PMH significant for anemia, arthritis, asthma, bradycardia, CAD< chronic diastolic CHF, CKD, COPD, depression, DVT, GERD, HLD, HTN, hypothyroidism, paraesophageal hernia, perforated stroke, DM, ventral hernia with bowel obstruction.     PT Comments    Pt is progressing with new shoe and is demonstrating a better controlled gait with longer distances today, able to walk to [facility] and then to door, but cautioned her not to overdo.  WBing on L anterior foot avoided by shoe, able to balance and manage with leg length difference.  Continue acutely until pt is discharged home with family to assist her.   Follow Up Recommendations  Home health PT;Supervision/Assistance - 24 hour     Equipment Recommendations  None recommended by PT    Recommendations for Other Services       Precautions / Restrictions Precautions Precautions: Fall Precaution Comments: watch O2 Required Braces or Orthoses: Other Brace/Splint (Darco shoe to unload anterior L foot) Restrictions Weight Bearing Restrictions: Yes LLE Weight Bearing: Partial weight bearing LLE Partial Weight Bearing Percentage or Pounds: 50 Other Position/Activity Restrictions: able to wb through only the L heel    Mobility  Bed Mobility Overal bed mobility: Needs Assistance Bed Mobility: Supine to Sit     Supine to sit: Min assist        Transfers Overall transfer level: Needs assistance Equipment used: Rolling walker (2 wheeled) Transfers: Sit to/from Stand Sit to Stand: Min assist Stand pivot transfers: Min assist       General transfer comment:  assisted to her [facility] and then to recliner  Ambulation/Gait Ambulation/Gait assistance: Min assist Ambulation Distance (Feet): 35 Feet Assistive device: Rolling walker (2 wheeled);1 person hand held assist Gait Pattern/deviations: Step-to pattern;Step-through pattern;Wide base of support;Trunk flexed;Decreased weight shift to left;Decreased stride length Gait velocity: Decreased Gait velocity interpretation: Below normal speed for age/gender General Gait Details: pt is managing PWB well and has increased her control of LLE with new shoe   Stairs            Wheelchair Mobility    Modified Rankin (Stroke Patients Only)       Balance     Sitting balance-Leahy Scale: Good       Standing balance-Leahy Scale: Fair                              Cognition Arousal/Alertness: Awake/alert Behavior During Therapy: WFL for tasks assessed/performed Overall Cognitive Status: Within Functional Limits for tasks assessed                                 General Comments: Likely baseline      Exercises      General Comments General comments (skin integrity, edema, etc.): no loss of O2 sats with effort, used RW and lengthened O2 line, sats remained above 93%      Pertinent Vitals/Pain Pain Assessment: No/denies pain    Home Living Family/patient expects to be discharged to:: Private residence Living Arrangements: Children Available Help at Discharge: Family;Available 24  hours/day Type of Home: House Home Access: Ramped entrance   Home Layout: One level Home Equipment: Walker - 4 wheels;Cane - single point;Hand held Careers information officer - 2 wheels;Bedside commode;Tub bench Additional Comments: has aide and family to assist her care    Prior Function Level of Independence: Needs assistance  Gait / Transfers Assistance Needed: RW for gait, has independent transfers prev ADL's / Homemaking Assistance Needed: family care for home and cooking, help with  bathing     PT Goals (current goals can now be found in the care plan section) Acute Rehab PT Goals Patient Stated Goal: to walk and get ready to get home Progress towards PT goals: Progressing toward goals    Frequency    Min 3X/week      PT Plan Current plan remains appropriate    Co-evaluation              AM-PAC PT "6 Clicks" Daily Activity  Outcome Measure  Difficulty turning over in bed (including adjusting bedclothes, sheets and blankets)?: A Little Difficulty moving from lying on back to sitting on the side of the bed? : Total Difficulty sitting down on and standing up from a chair with arms (e.g., wheelchair, bedside commode, etc,.)?: Total Help needed moving to and from a bed to chair (including a wheelchair)?: A Little Help needed walking in hospital room?: A Little Help needed climbing 3-5 steps with a railing? : A Lot 6 Click Score: 13    End of Session Equipment Utilized During Treatment: Gait belt;Oxygen Activity Tolerance: Patient limited by fatigue Patient left: in chair;with call bell/phone within reach;with nursing/sitter in room Nurse Communication: Mobility status PT Visit Diagnosis: Muscle weakness (generalized) (M62.81);Difficulty in walking, not elsewhere classified (R26.2)     Time: 2595-6387 PT Time Calculation (min) (ACUTE ONLY): 27 min  Charges:  $Gait Training: 8-22 mins $Therapeutic Activity: 8-22 mins                    G Codes:       Ivar Drape 10/07/2016, 5:40 PM   Samul Dada, PT MS Acute Rehab Dept. Number: [facility] [id number] and [facility] [phone]

## 2016-09-28 DIAGNOSIS — J441 Chronic obstructive pulmonary disease with (acute) exacerbation: Secondary | ICD-10-CM | POA: Diagnosis not present

## 2016-09-28 DIAGNOSIS — E11621 Type 2 diabetes mellitus with foot ulcer: Secondary | ICD-10-CM | POA: Diagnosis not present

## 2016-09-28 DIAGNOSIS — L97521 Non-pressure chronic ulcer of other part of left foot limited to breakdown of skin: Secondary | ICD-10-CM | POA: Diagnosis not present

## 2016-09-28 DIAGNOSIS — L97523 Non-pressure chronic ulcer of other part of left foot with necrosis of muscle: Secondary | ICD-10-CM | POA: Diagnosis not present

## 2016-09-28 DIAGNOSIS — I130 Hypertensive heart and chronic kidney disease with heart failure and stage 1 through stage 4 chronic kidney disease, or unspecified chronic kidney disease: Secondary | ICD-10-CM | POA: Diagnosis not present

## 2016-09-28 DIAGNOSIS — I2510 Atherosclerotic heart disease of native coronary artery without angina pectoris: Secondary | ICD-10-CM | POA: Diagnosis not present

## 2016-09-29 ENCOUNTER — Telehealth: Payer: Self-pay

## 2016-09-29 DIAGNOSIS — L97523 Non-pressure chronic ulcer of other part of left foot with necrosis of muscle: Secondary | ICD-10-CM | POA: Diagnosis not present

## 2016-09-29 DIAGNOSIS — J441 Chronic obstructive pulmonary disease with (acute) exacerbation: Secondary | ICD-10-CM | POA: Diagnosis not present

## 2016-09-29 DIAGNOSIS — L97521 Non-pressure chronic ulcer of other part of left foot limited to breakdown of skin: Secondary | ICD-10-CM | POA: Diagnosis not present

## 2016-09-29 DIAGNOSIS — E11621 Type 2 diabetes mellitus with foot ulcer: Secondary | ICD-10-CM | POA: Diagnosis not present

## 2016-09-29 DIAGNOSIS — I2510 Atherosclerotic heart disease of native coronary artery without angina pectoris: Secondary | ICD-10-CM | POA: Diagnosis not present

## 2016-09-29 DIAGNOSIS — I130 Hypertensive heart and chronic kidney disease with heart failure and stage 1 through stage 4 chronic kidney disease, or unspecified chronic kidney disease: Secondary | ICD-10-CM | POA: Diagnosis not present

## 2016-09-29 LAB — TYPE AND SCREEN
ABO/RH(D): AB POS
Antibody Screen: POSITIVE
DAT, IgG: NEGATIVE
Donor AG Type: NEGATIVE
Donor AG Type: NEGATIVE
Unit division: 0

## 2016-09-29 LAB — BPAM RBC
Blood Product Expiration Date: 201807142359
Blood Product Expiration Date: 201807142359
Blood Product Expiration Date: 201807142359
ISSUE DATE / TIME: 201806261847
ISSUE DATE / TIME: 201806262214
Unit Type and Rh: 8400
Unit Type and Rh: 8400
Unit Type and Rh: 8400

## 2016-09-29 NOTE — Telephone Encounter (Signed)
Transition Care Management Follow-Up Telephone Call   Date discharged and where: [facility] on 09/27/16  How have you been since you were released from the hospital?  "Pretty good" per daughter  Any patient concerns? Pt was taken off lasix and was concerned about not getting fluid off. Stated she was going to call the cardiologist  Items Reviewed:   Meds: Yes, discussed importance of taking insulin- Lantus vs. Novolog  Allergies: Y  Dietary Changes Reviewed: Y  Functional Questionnaire:  Independent-I Dependent-D  ADLs:   Dressing- I    Eating- I   Maintaining continence- I   Transferring- I with assist of Walker   Transportation- I   Meal Prep- D   Managing Meds- D  Confirmed importance and Date/Time of follow-up visits scheduled: Yes, Dr. Eulas Post 10/03/16 @ 3:30pm   Confirmed with patient if condition worsens to call PCP or go to the Emergency Dept. Patient was given office number and encouraged to call back with questions or concerns: yes

## 2016-10-02 DIAGNOSIS — I2510 Atherosclerotic heart disease of native coronary artery without angina pectoris: Secondary | ICD-10-CM | POA: Diagnosis not present

## 2016-10-02 DIAGNOSIS — L97523 Non-pressure chronic ulcer of other part of left foot with necrosis of muscle: Secondary | ICD-10-CM | POA: Diagnosis not present

## 2016-10-02 DIAGNOSIS — L97521 Non-pressure chronic ulcer of other part of left foot limited to breakdown of skin: Secondary | ICD-10-CM | POA: Diagnosis not present

## 2016-10-02 DIAGNOSIS — J441 Chronic obstructive pulmonary disease with (acute) exacerbation: Secondary | ICD-10-CM | POA: Diagnosis not present

## 2016-10-02 DIAGNOSIS — E11621 Type 2 diabetes mellitus with foot ulcer: Secondary | ICD-10-CM | POA: Diagnosis not present

## 2016-10-02 DIAGNOSIS — I130 Hypertensive heart and chronic kidney disease with heart failure and stage 1 through stage 4 chronic kidney disease, or unspecified chronic kidney disease: Secondary | ICD-10-CM | POA: Diagnosis not present

## 2016-10-03 ENCOUNTER — Telehealth: Payer: Self-pay | Admitting: Vascular Surgery

## 2016-10-03 ENCOUNTER — Encounter: Payer: Self-pay | Admitting: Internal Medicine

## 2016-10-03 ENCOUNTER — Telehealth: Payer: Self-pay

## 2016-10-03 ENCOUNTER — Encounter: Payer: Medicare Other | Admitting: Internal Medicine

## 2016-10-03 ENCOUNTER — Ambulatory Visit (INDEPENDENT_AMBULATORY_CARE_PROVIDER_SITE_OTHER): Payer: Medicare Other | Admitting: Internal Medicine

## 2016-10-03 VITALS — BP 154/74 | HR 58 | Temp 97.8°F | Ht 66.0 in | Wt 149.8 lb

## 2016-10-03 DIAGNOSIS — I82542 Chronic embolism and thrombosis of left tibial vein: Secondary | ICD-10-CM | POA: Diagnosis not present

## 2016-10-03 DIAGNOSIS — E039 Hypothyroidism, unspecified: Secondary | ICD-10-CM | POA: Diagnosis not present

## 2016-10-03 DIAGNOSIS — I480 Paroxysmal atrial fibrillation: Secondary | ICD-10-CM | POA: Diagnosis not present

## 2016-10-03 DIAGNOSIS — Z931 Gastrostomy status: Secondary | ICD-10-CM

## 2016-10-03 DIAGNOSIS — D649 Anemia, unspecified: Secondary | ICD-10-CM

## 2016-10-03 DIAGNOSIS — J449 Chronic obstructive pulmonary disease, unspecified: Secondary | ICD-10-CM | POA: Diagnosis not present

## 2016-10-03 DIAGNOSIS — I10 Essential (primary) hypertension: Secondary | ICD-10-CM

## 2016-10-03 DIAGNOSIS — I5032 Chronic diastolic (congestive) heart failure: Secondary | ICD-10-CM

## 2016-10-03 DIAGNOSIS — Z89422 Acquired absence of other left toe(s): Secondary | ICD-10-CM

## 2016-10-03 DIAGNOSIS — J4489 Other specified chronic obstructive pulmonary disease: Secondary | ICD-10-CM | POA: Insufficient documentation

## 2016-10-03 LAB — CBC WITH DIFFERENTIAL/PLATELET
Basophils Absolute: 58 cells/uL (ref 0–200)
Basophils Relative: 1 %
Eosinophils Absolute: 580 cells/uL — ABNORMAL HIGH (ref 15–500)
Eosinophils Relative: 10 %
HCT: 33.2 % — ABNORMAL LOW (ref 35.0–45.0)
Hemoglobin: 10.5 g/dL — ABNORMAL LOW (ref 11.7–15.5)
Lymphocytes Relative: 22 %
Lymphs Abs: 1276 cells/uL (ref 850–3900)
MCH: 28 pg (ref 27.0–33.0)
MCHC: 31.6 g/dL — ABNORMAL LOW (ref 32.0–36.0)
MCV: 88.5 fL (ref 80.0–100.0)
MPV: 9.8 fL (ref 7.5–12.5)
Monocytes Absolute: 696 cells/uL (ref 200–950)
Monocytes Relative: 12 %
Neutro Abs: 3190 cells/uL (ref 1500–7800)
Neutrophils Relative %: 55 %
Platelets: 358 10*3/uL (ref 140–400)
RBC: 3.75 MIL/uL — ABNORMAL LOW (ref 3.80–5.10)
RDW: 17.8 % — ABNORMAL HIGH (ref 11.0–15.0)
WBC: 5.8 10*3/uL (ref 3.8–10.8)

## 2016-10-03 LAB — COMPLETE METABOLIC PANEL WITH GFR
ALT: 25 U/L (ref 6–29)
AST: 20 U/L (ref 10–35)
Albumin: 3.2 g/dL — ABNORMAL LOW (ref 3.6–5.1)
Alkaline Phosphatase: 73 U/L (ref 33–130)
BUN: 32 mg/dL — ABNORMAL HIGH (ref 7–25)
CO2: 27 mmol/L (ref 20–31)
Calcium: 9.1 mg/dL (ref 8.6–10.4)
Chloride: 97 mmol/L — ABNORMAL LOW (ref 98–110)
Creat: 1.1 mg/dL — ABNORMAL HIGH (ref 0.60–0.88)
GFR, Est African American: 51 mL/min — ABNORMAL LOW (ref >=60)
GFR, Est Non African American: 44 mL/min — ABNORMAL LOW (ref >=60)
Glucose, Bld: 102 mg/dL — ABNORMAL HIGH (ref 65–99)
Potassium: 4.7 mmol/L (ref 3.5–5.3)
Sodium: 134 mmol/L — ABNORMAL LOW (ref 135–146)
Total Bilirubin: 0.4 mg/dL (ref 0.2–1.2)
Total Protein: 7.1 g/dL (ref 6.1–8.1)

## 2016-10-03 MED ORDER — CARVEDILOL 6.25 MG PO TABS: 6.2500 mg | ORAL_TABLET | Freq: Two times a day (BID) | ORAL | 6 refills | Status: DC

## 2016-10-03 MED ORDER — MOMETASONE FURO-FORMOTEROL FUM 200-5 MCG/ACT IN AERO: 2.0000 | INHALATION_SPRAY | Freq: Two times a day (BID) | RESPIRATORY_TRACT | 6 refills | Status: DC

## 2016-10-03 NOTE — Patient Instructions (Addendum)
Daily weight and record. Call cardiology if weight gain >2lbs in 24 hrs or >5 lbs in 5 days  May use O2 (oxygen) as needed for shortness of breath  Continue current medications as ordered  Continue home health PT  Follow up with specialists as scheduled  INR managed by cardiology Dr Meda Coffee  Follow up in 1 month for DM, peg tube, hyponatremia

## 2016-10-03 NOTE — Telephone Encounter (Signed)
Anderson Malta with [employer] called to get a status update on form faxed on 09/28/16.   [employer] phone system and fax machine was down on 09/28/16, form never received. Anderson Malta will refax, patient has appointment today with Dr.Carter, form to be completed at that time

## 2016-10-03 NOTE — Progress Notes (Addendum)
Patient ID: Shannon [name], female   DOB: Jan 03, 1924, 83 y.o.   MRN: [id number]    Location:  [facility] Place of Service: OFFICE  Chief Complaint  Patient presents with  . Transitions Of Care    09/14/2016-09/27/2016  . Medication Problem    patients daughter is not sure if patient should still be taking Coreg     HPI:  83 yo female seen today following hospital stay for COPD exacerbation, acute hypoxemic respiratory failure, hyponatremia, left toe gangrene, DMII, accelerated HTN, CKD stage 3, chronic dHF, PAD with gangrene. She was tx with IV steroids-->po steroids. CXR revealed tiny left pleural effusion and atelectasis. She was given albuterol nebs, flonase and dulera.  IV Cefepime/flagyl 09/14/16-09/17/16; azithromycin 09/14/16-09/18/16. She had hypoxia with O2 down to 83% with therapy and was d/c'd on home O2. She underwent left 2nd toe amputation on 09/25/16 by vascular sx. ABI on 09/24/16 showed right 0.86; left 0.96. She had A-gram with LLE runoff and drug coated stent placed to left SFA 09/21/16. Vascular sx recommended plavix x 3 mos (end date 12/22/16) along with coumadin for life 2/2 afib. Cardiac/BP meds adjusted. Hgb dropped to 8 and she was transfused 1 unit PRBCs. Insulin adjusted 2/2 mild DKA (tx with insulin gtt). TF continued at night (osmolite 1.5) with free water flushes (230 ml BID). WBC peaked 20K-->11.4K; Hgb 8; Na 129-->133; INR 1.09; Cr peaked 1.28-->1.22; blood glucose 300-400s; albumin 2 at d/c.   She reports feeling better since d/c. She is tolerating therapy. She ambulated without O2 yesterday with PT and O2 sats stayed at 98%. She is currently not on O2 in exam room. She reports no SOB or CP  She has seasonal allergy sx's and takes claritin daily and has flonase.   Seizure d/o - No seizures reported. Takes gabapentin  HTN/afib/aflutter/hyperlipidemia/chronic dHF - BP borderline controlled on coreg and imdur. Cholesterol stable on lipitor. Rate controlled on coreg. Lasix stopped  prior to recent hospital d/c. She takes coumadin for anticoagulation. INR Followed by cardio Dr Delton See. EF 60-65%  GOO - s/p GJ tube. Takes Protonix BID. Constipation stable on lactulose. She gets TF at night (osmolite 1.5 cal 237 ml or 3.3 cans per day with promod 3 oz per day @ 50ml.hr x 16hrs) and free water flushes (230 ml) BID. Drinks liquids po. Takes zofran prn. Albumin 2.0  Hyponatremia/ hx SIADH- she was taking salt tabs but noticed the entire tab in her stool intermittently and no longer takes it. Na 133  DM - previously diet controlled but now on insulin tx (novolog SSI and lantus qhs). A1c 7.4%. Takes gabapentin for neuropathy  Hx CVA - stable. Takes ASA daily  Depression - mood stable on sertraline  Arthritis - joint pain stable on tramadol, norco.  Hypothyroidism - takes levothyroxine 137 mcg daily. TSH 5.642  Hx Pressure sore/right 3rd toe osteomyelitis - completed 1 month of augmentin. 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 Followed by podiatry.   PAD - s/p left SFA stent 09/21/16. ABI on 09/24/16 showed right 0.86; left 0.96. followed by vascular sx.  COPD/asthma - takes dulera, xopenex hfa, duonebs. Followed by pulmonary.  Glaucoma - stable on xalatan gtts  Past Medical History:  Diagnosis Date  . Anemia    previous blood transfusions  . Arthritis    "all over"  . Asthma   . Bradycardia    requiring previous d/c of BB and reduction of amiodarone  . CAD (coronary  artery disease)    nonobstructive per notes  . Chronic diastolic CHF (congestive heart failure) (HCC)   . CKD (chronic kidney disease), stage III   . Complication of blood transfusion    "got the wrong blood type at [facility] in ~ 2015; no adverse reaction that we are aware of"/daughter, [name] (01/27/2016)  . COPD (chronic obstructive pulmonary disease) (HCC)   . Depression    "light case"  . DVT (deep venous thrombosis) (HCC) 01/2016   a. LLE DVT 01/2016 -  switched from Eliquis to Coumadin.  . Gastric stenosis    a. s/p stomach tube  . GERD (gastroesophageal reflux disease)   . History of blood transfusion    "several" (01/27/2016)  . History of stomach ulcers   . Hyperlipidemia   . Hypertension   . Hypothyroidism   . Paraesophageal hernia   . Perforated gastric ulcer (HCC)   . Seasonal allergies   . SIADH (syndrome of inappropriate ADH production) (HCC)    [name] 01/10/2015  . Small bowel obstruction (HCC)    "I don't know how many" (01/11/2015)  . Stroke (HCC)    "light one"  . Type II diabetes mellitus (HCC)    "related to prednisone use  for > 20 yr; once predinose stopped; no more DM RX" (01/27/2016)  . Ventral hernia with bowel obstruction     Past Surgical History:  Procedure Laterality Date  . ABDOMINAL AORTOGRAM N/A 09/21/2016   Procedure: Abdominal Aortogram;  Surgeon: Maeola Harman, MD;  Location: [facility] INVASIVE CV LAB;  Service: Cardiovascular;  Laterality: N/A;  . AMPUTATION Left 09/25/2016   Procedure: AMPUTATION DIGIT- LEFT 2ND AND 3RD TOES;  Surgeon: Larina Earthly, MD;  Location: [facility] OR;  Service: Vascular;  Laterality: Left;  . CATARACT EXTRACTION W/ INTRAOCULAR LENS  IMPLANT, BILATERAL    . CHOLECYSTECTOMY OPEN    . COLECTOMY    . ESOPHAGOGASTRODUODENOSCOPY N/A 01/19/2014   Procedure: ESOPHAGOGASTRODUODENOSCOPY (EGD);  Surgeon: Hilarie Fredrickson, MD;  Location: [facility] ENDOSCOPY;  Service: Endoscopy;  Laterality: N/A;  . ESOPHAGOGASTRODUODENOSCOPY N/A 01/20/2014   Procedure: ESOPHAGOGASTRODUODENOSCOPY (EGD);  Surgeon: Hilarie Fredrickson, MD;  Location: [facility] ENDOSCOPY;  Service: Endoscopy;  Laterality: N/A;  . ESOPHAGOGASTRODUODENOSCOPY N/A 03/19/2014   Procedure: ESOPHAGOGASTRODUODENOSCOPY (EGD);  Surgeon: Rachael Fee, MD;  Location: [facility] ENDOSCOPY;  Service: Endoscopy;  Laterality: N/A;  . ESOPHAGOGASTRODUODENOSCOPY N/A 07/08/2015   Procedure: ESOPHAGOGASTRODUODENOSCOPY (EGD);  Surgeon: Sherrilyn Rist, MD;  Location: [facility]  ENDOSCOPY;  Service: Endoscopy;  Laterality: N/A;  . ESOPHAGOGASTRODUODENOSCOPY (EGD) WITH PROPOFOL N/A 09/15/2015   Procedure: ESOPHAGOGASTRODUODENOSCOPY (EGD) WITH PROPOFOL;  Surgeon: Ruffin Frederick, MD;  Location: [facility] ENDOSCOPY;  Service: Gastroenterology;  Laterality: N/A;  . GASTROJEJUNOSTOMY     hx/notes 01/10/2015  . GASTROJEJUNOSTOMY N/A 09/23/2015   Procedure: OPEN GASTROJEJUNOSTOMY TUBE PLACEMENT;  Surgeon: De Blanch Kinsinger, MD;  Location: [facility] ORS;  Service: General;  Laterality: N/A;  . GLAUCOMA SURGERY Bilateral   . HERNIA REPAIR  2015  . IR GENERIC HISTORICAL  01/07/2016   IR GJ TUBE CHANGE 01/07/2016 Malachy Moan, MD [facility]  . IR GENERIC HISTORICAL  01/27/2016   IR MECH REMOV OBSTRUC MAT ANY COLON TUBE W/FLUORO 01/27/2016 Richarda Overlie, MD [facility]  . IR GENERIC HISTORICAL  02/07/2016   IR PATIENT EVAL TECH 0-60 MINS Darrell K Allred, PA-C [facility]  . IR GENERIC HISTORICAL  02/08/2016   IR GJ TUBE CHANGE 02/08/2016 Berdine Dance, MD [facility]  . IR GENERIC HISTORICAL  01/06/2016   IR GJ TUBE CHANGE 01/06/2016 [facility]-RAD OUT REF  . IR GENERIC HISTORICAL  05/02/2016   IR CM INJ ANY COLONIC TUBE W/FLUORO 05/02/2016 Oley Balm, MD [facility]  . IR GENERIC HISTORICAL  05/15/2016   IR GJ TUBE CHANGE 05/15/2016 Simonne Come, MD [facility]  . IR GENERIC HISTORICAL  06/28/2016   IR GJ TUBE CHANGE 06/28/2016 [facility]  . LAPAROTOMY N/A 01/20/2015   Procedure: EXPLORATORY LAPAROTOMY;  Surgeon: Abigail Miyamoto, MD;  Location: [facility] OR;  Service: General;  Laterality: N/A;  . LOWER EXTREMITY ANGIOGRAPHY Left 09/21/2016   Procedure: Lower Extremity Angiography;  Surgeon: Maeola Harman, MD;  Location: [facility] INVASIVE CV LAB;  Service: Cardiovascular;  Laterality: Left;  . LYSIS OF ADHESION N/A 01/20/2015   Procedure: LYSIS OF ADHESIONS < 1 HOUR;  Surgeon: Abigail Miyamoto, MD;  Location: [facility] OR;  Service: General;  Laterality: N/A;  . PERIPHERAL VASCULAR  BALLOON ANGIOPLASTY Left 09/21/2016   Procedure: Peripheral Vascular Balloon Angioplasty;  Surgeon: Maeola Harman, MD;  Location: [facility] INVASIVE CV LAB;  Service: Cardiovascular;  Laterality: Left;  drug coated balloon  . TONSILLECTOMY    . TUBAL LIGATION    . VENTRAL HERNIA REPAIR  2015   incarcerated ventral hernia ([facility] 09/2013)/notes 01/10/2015    Patient Care Team: Kirt Boys, DO as PCP - General (Internal Medicine) Jason Coop, MD as Referring Physician (Cardiology) Carole Binning, MD as Referring Physician (Ophthalmology) Felecia Shelling, DPM as Consulting Physician (Podiatry)  Social History   Social History  . Marital status: Widowed    Spouse name: N/A  . Number of children: 3  . Years of education: N/A   Occupational History  . Not on file.   Social History Main Topics  . Smoking status: Never Smoker  . Smokeless tobacco: Former Neurosurgeon    Types: Snuff     Comment: "used snuff in my younger days"  . Alcohol use No  . Drug use: No  . Sexual activity: No   Other Topics Concern  . Not on file   Social History Narrative   Married in [place], lives in one story house with 2 people and no pets   Occupation: ?   Has a living Will, [place], and doesn't have DNR   Was living with Husband (in frail health) in [place] until 09/2013.  After her [facility] admission they both came to live with daughter in [place]     reports that she has never smoked. She has quit using smokeless tobacco. Her smokeless tobacco use included Snuff. She reports that she does not drink alcohol or use drugs.  Family History  Problem Relation Age of Onset  . Stroke Mother   . Hypertension Mother   . Diabetes Brother   . Heart attack Neg Hx    Family Status  Relation Status  . Mother Deceased  . Father Deceased  . MGM Deceased  . MGF Deceased  . PGM Deceased  . PGF Deceased  . Brother (Not Specified)  . Neg Hx (Not Specified)     Allergies  Allergen  Reactions  . Penicillins Itching, Rash and Other (See Comments)    Tolerated unasyn, zosyn & cephalosporins in the past.  Has patient had a PCN reaction causing immediate rash, facial/tongue/throat swelling, SOB or lightheadedness with hypotension: Yes Has patient had a PCN reaction causing severe rash involving mucus membranes or skin necrosis: Unk Has patient had a PCN reaction that required hospitalization: Unk Has  patient had a PCN reaction occurring within the last 10 years: Unk If all of the above answers are "NO", then may proceed with Cephalosporins  . Other     Patient tolerates amoxicillin and cephalosporins    Medications: Patient's Medications  New Prescriptions   No medications on file  Previous Medications   ACETAMINOPHEN (TYLENOL) 500 MG TABLET    Take 1,000 mg by mouth every 6 (six) hours as needed (pain).   AMINO ACIDS-PROTEIN HYDROLYS PO    Take 30 mLs by mouth 2 (two) times daily.   ATORVASTATIN (LIPITOR) 10 MG TABLET    TAKE ONE TABLET BY MOUTH ONCE DAILY   B COMPLEX VITAMINS TABLET    Take 1 tablet by mouth daily.    BENZONATATE (TESSALON PERLES) 100 MG CAPSULE    Take two capsules by mouth three times daily as needed for cough   CALCIUM-VITAMIN D (OSCAL WITH D) 500-200 MG-UNIT PER TABLET    Take 1 tablet by mouth daily with breakfast.    CARVEDILOL (COREG) 6.25 MG TABLET    Take 1 tablet (6.25 mg total) by mouth 2 (two) times daily.   CHLORHEXIDINE (PERIDEX) 0.12 % SOLUTION    Use as directed 15 mLs in the mouth or throat 2 (two) times daily.   CLOPIDOGREL (PLAVIX) 75 MG TABLET    Take 1 tablet (75 mg total) by mouth daily with breakfast.   FEEDING SUPPLEMENT (OSMOLITE 1 CAL) LIQD    Take 237 mLs by mouth at bedtime.   FLUTICASONE (FLONASE) 50 MCG/ACT NASAL SPRAY    Place 2 sprays into both nostrils daily.   GABAPENTIN (NEURONTIN) 300 MG CAPSULE    1 tablet by mouth three times daily, may have additional dose every 8 hours as needed pain   INSULIN ASPART (NOVOLOG  FLEXPEN) 100 UNIT/ML FLEXPEN    Before each meal 3 times a day, 140-199 - 2 units, 200-250 - 4 units, 251-299 - 6 units,  300-349 - 8 units,  350 or above 10 units. Insulin PEN if approved, provide syringes and needles if needed.   INSULIN GLARGINE (LANTUS) 100 UNIT/ML INJECTION    Inject 0.1 mLs (10 Units total) into the skin daily.   IPRATROPIUM-ALBUTEROL (DUONEB) 0.5-2.5 (3) MG/3ML SOLN    Take 3 mLs by nebulization every 6 (six) hours as needed (sob). Use 3 times daily x 4 days then every 6 hours as needed.   ISOSORBIDE MONONITRATE (IMDUR) 30 MG 24 HR TABLET    Take 30 mg by mouth daily.   LATANOPROST (XALATAN) 0.005 % OPHTHALMIC SOLUTION    Place 1 drop into both eyes at bedtime.   LEVALBUTEROL (XOPENEX HFA) 45 MCG/ACT INHALER    Inhale 2 puffs into the lungs every 6 (six) hours as needed for wheezing or shortness of breath.   LEVOTHYROXINE (SYNTHROID, LEVOTHROID) 137 MCG TABLET    TAKE 1 TABLET (137 MCG TOTAL) BY MOUTH DAILY BEFORE BREAKFAST.   MALTODEXTRIN-XANTHAN GUM (RESOURCE THICKENUP CLEAR) POWD    Take 120 g by mouth as needed (use to thicken liquids.).   MOMETASONE-FORMOTEROL (DULERA) 200-5 MCG/ACT AERO    Inhale 2 puffs into the lungs 2 (two) times daily.   MULTIPLE VITAMIN (MULTIVITAMIN WITH MINERALS) TABS TABLET    Take 1 tablet by mouth daily.   NYSTATIN (MYCOSTATIN/NYSTOP) POWDER    Apply topically 3 (three) times daily.   OXYCODONE-ACETAMINOPHEN (PERCOCET/ROXICET) 5-325 MG TABLET    Take 1-2 tablets by mouth every 3 (three) hours as needed for moderate  pain.   POLYETHYL GLYCOL-PROPYL GLYCOL 0.4-0.3 % SOLN    Place 1 drop into both eyes 4 (four) times daily.    POLYETHYLENE GLYCOL (MIRALAX / GLYCOLAX) PACKET    Take 17 g by mouth daily as needed for mild constipation.   RESTASIS 0.05 % OPHTHALMIC EMULSION    USE 1 DROP INTO BOTH EYES TWICE DAILY   SERTRALINE (ZOLOFT) 100 MG TABLET    TAKE ONE TABLET BY MOUTH ONCE DAILY FOR DEPRESSION   WARFARIN (COUMADIN) 2.5 MG TABLET    TAKE AS  DIRECTED BY COUMADIN CLINIC.   WATER FOR IRRIGATION, STERILE (FREE WATER) SOLN    Place 230 mLs into feeding tube 3 (three) times daily.  Modified Medications   No medications on file  Discontinued Medications   No medications on file    Review of Systems  Constitutional: Positive for appetite change.  Respiratory: Positive for wheezing. Negative for shortness of breath.   Cardiovascular: Positive for leg swelling. Negative for chest pain.  Gastrointestinal: Positive for abdominal distention. Negative for diarrhea.  Musculoskeletal: Positive for arthralgias, back pain, gait problem and joint swelling.  Skin: Positive for rash and wound.  All other systems reviewed and are negative.   Vitals:   10/03/16 1502  BP: (!) 154/74  Pulse: (!) 58  Temp: 97.8 F (36.6 C)  TempSrc: Oral  SpO2: 98%  Weight: 149 lb 12.8 oz (67.9 kg)  Height: 5\' 6"  (1.676 m)   Body mass index is 24.18 kg/m.  Physical Exam  Constitutional: She is oriented to person, place, and time. She appears well-developed.  Frail appearing, sitting upright in chair, no conversational dyspnea. Daughter present  HENT:  Mouth/Throat: Oropharynx is clear and moist. No oropharyngeal exudate.  MMM  Eyes: Pupils are equal, round, and reactive to light. No scleral icterus.  Neck: Neck supple. Carotid bruit is not present. No tracheal deviation present. No thyromegaly present.  Cardiovascular: Regular rhythm.  Bradycardia present.  Exam reveals no gallop and no friction rub.   Murmur heard. Pulses:      Dorsalis pedis pulses are 1+ on the right side, and 1+ on the left side.       Posterior tibial pulses are 1+ on the right side, and 1+ on the left side.  +1 pitting LLE/trace RLE edema with shiny appearance and chronic venous stasis changes L>R. No calf TTP but has anterior leg TTP b/l  Pulmonary/Chest: Effort normal. No stridor. No respiratory distress. She has wheezes (end expiratory b/l). She has no rales.  Abdominal:  Soft. Bowel sounds are normal. She exhibits no distension and no mass. There is no hepatomegaly. There is no tenderness. There is no rebound and no guarding.  GJ tube intact with no signs of redness or d/c at insertion site; NT. Leakage around site noted  Musculoskeletal: She exhibits edema and deformity.  Lymphadenopathy:    She has no cervical adenopathy.  Neurological: She is alert and oriented to person, place, and time. Gait (uses rolling walker) abnormal.  Skin: Skin is warm and dry. No rash noted.  Left foot dsg c/d/i; soft shoe intact  Psychiatric: She has a normal mood and affect. Her behavior is normal. Judgment and thought content normal.     Labs reviewed: Admission on 09/14/2016, Discharged on 09/27/2016  No results displayed because visit has over 200 results.  CBC Latest Ref Rng & Units 09/26/2016 09/25/2016 09/24/2016  WBC 4.0 - 10.5 K/uL 11.4(H) 17.2(H) 15.4(H)  Hemoglobin 12.0 - 15.0 g/dL 1.6(X)  9.1(L) 8.9(L)  Hematocrit 36.0 - 46.0 % 25.1(L) 27.9(L) 27.9(L)  Platelets 150 - 400 K/uL 263 310 314   CMP Latest Ref Rng & Units 09/26/2016 09/25/2016 09/24/2016  Glucose 65 - 99 mg/dL 865(H) 846(N) 629(B)  BUN 6 - 20 mg/dL 28(U) 13(K) 44(W)  Creatinine 0.44 - 1.00 mg/dL 1.02(V) 2.53(G) 6.44(I)  Sodium 135 - 145 mmol/L 133(L) 136 136  Potassium 3.5 - 5.1 mmol/L 4.4 4.6 4.2  Chloride 101 - 111 mmol/L 103 105 104  CO2 22 - 32 mmol/L 25 25 26   Calcium 8.9 - 10.3 mg/dL 8.3(L) 8.7(L) 8.5(L)  Total Protein 6.5 - 8.1 g/dL 3.4(V) - -  Total Bilirubin 0.3 - 1.2 mg/dL 0.4 - -  Alkaline Phos 38 - 126 U/L 57 - -  AST 15 - 41 U/L 25 - -  ALT 14 - 54 U/L 31 - -   Lab Results  Component Value Date   HGBA1C 7.7 (H) 08/16/2016     Anti-coag visit on 09/13/2016  Component Date Value Ref Range Status  . INR 09/13/2016 2.9   Final  Admission on 08/12/2016, Discharged on 08/18/2016  No results displayed because visit has over 200 results.    Anti-coag visit on 08/11/2016  Component  Date Value Ref Range Status  . INR 08/11/2016 2.4   Final  Office Visit on 08/07/2016  Component Date Value Ref Range Status  . WBC 08/07/2016 8.3  3.8 - 10.8 K/uL Final  . RBC 08/07/2016 3.02* 3.80 - 5.10 MIL/uL Final  . Hemoglobin 08/07/2016 8.6* 11.7 - 15.5 g/dL Final  . HCT 42/59/5638 27.3* 35.0 - 45.0 % Final  . MCV 08/07/2016 90.4  80.0 - 100.0 fL Final  . MCH 08/07/2016 28.5  27.0 - 33.0 pg Final  . MCHC 08/07/2016 31.5* 32.0 - 36.0 g/dL Final  . RDW 75/64/3329 17.8* 11.0 - 15.0 % Final  . Platelets 08/07/2016 495* 140 - 400 K/uL Final  . MPV 08/07/2016 9.6  7.5 - 12.5 fL Final  . Neutro Abs 08/07/2016 4648  1,500 - 7,800 cells/uL Final  . Lymphs Abs 08/07/2016 1577  850 - 3,900 cells/uL Final  . Monocytes Absolute 08/07/2016 1079* 200 - 950 cells/uL Final  . Eosinophils Absolute 08/07/2016 913* 15 - 500 cells/uL Final  . Basophils Absolute 08/07/2016 83  0 - 200 cells/uL Final  . Neutrophils Relative % 08/07/2016 56  % Final  . Lymphocytes Relative 08/07/2016 19  % Final  . Monocytes Relative 08/07/2016 13  % Final  . Eosinophils Relative 08/07/2016 11  % Final  . Basophils Relative 08/07/2016 1  % Final  . Smear Review 08/07/2016 Criteria for review not met   Final  . Sodium 08/07/2016 133* 135 - 146 mmol/L Final  . Potassium 08/07/2016 5.3  3.5 - 5.3 mmol/L Final  . Chloride 08/07/2016 100  98 - 110 mmol/L Final  . CO2 08/07/2016 25  20 - 31 mmol/L Final  . Glucose, Bld 08/07/2016 170* 65 - 99 mg/dL Final  . BUN 51/88/4166 58* 7 - 25 mg/dL Final  . Creat 10/01/1599 1.20* 0.60 - 0.88 mg/dL Final   Comment:   For patients > or = 81 years of age: The upper reference limit for Creatinine is approximately 13% higher for people identified as African-American.     . Calcium 08/07/2016 9.2  8.6 - 10.4 mg/dL Final  . GFR, Est African American 08/07/2016 46* >=60 mL/min Final  . GFR, Est Non African American 08/07/2016 40* >=60 mL/min  Final  Anti-coag visit on 08/02/2016    Component Date Value Ref Range Status  . INR 08/02/2016 3.0   Final   [facility]  Admission on 07/28/2016, Discharged on 08/01/2016  Component Date Value Ref Range Status  . WBC 07/28/2016 9.1  4.0 - 10.5 K/uL Final  . RBC 07/28/2016 3.04* 3.87 - 5.11 MIL/uL Final  . Hemoglobin 07/28/2016 8.9* 12.0 - 15.0 g/dL Final  . HCT 16/04/930 26.0* 36.0 - 46.0 % Final  . MCV 07/28/2016 85.5  78.0 - 100.0 fL Final  . MCH 07/28/2016 29.3  26.0 - 34.0 pg Final  . MCHC 07/28/2016 34.2  30.0 - 36.0 g/dL Final  . RDW 35/57/3220 18.0* 11.5 - 15.5 % Final  . Platelets 07/28/2016 530* 150 - 400 K/uL Final  . Neutrophils Relative % 07/28/2016 71  % Final  . Neutro Abs 07/28/2016 6.4  1.7 - 7.7 K/uL Final  . Lymphocytes Relative 07/28/2016 19  % Final  . Lymphs Abs 07/28/2016 1.7  0.7 - 4.0 K/uL Final  . Monocytes Relative 07/28/2016 7  % Final  . Monocytes Absolute 07/28/2016 0.6  0.1 - 1.0 K/uL Final  . Eosinophils Relative 07/28/2016 3  % Final  . Eosinophils Absolute 07/28/2016 0.3  0.0 - 0.7 K/uL Final  . Basophils Relative 07/28/2016 0  % Final  . Basophils Absolute 07/28/2016 0.0  0.0 - 0.1 K/uL Final  . Sodium 07/28/2016 136  135 - 145 mmol/L Final  . Potassium 07/28/2016 4.2  3.5 - 5.1 mmol/L Final  . Chloride 07/28/2016 102  101 - 111 mmol/L Final  . BUN 07/28/2016 43* 6 - 20 mg/dL Final  . Creatinine, Ser 07/28/2016 1.30* 0.44 - 1.00 mg/dL Final  . Glucose, Bld 25/42/7062 101* 65 - 99 mg/dL Final  . Calcium, Ion 37/62/8315 1.14* 1.15 - 1.40 mmol/L Final  . TCO2 07/28/2016 24  0 - 100 mmol/L Final  . Hemoglobin 07/28/2016 9.5* 12.0 - 15.0 g/dL Final  . HCT 17/61/6073 28.0* 36.0 - 46.0 % Final  . Prothrombin Time 07/28/2016 28.8* 11.4 - 15.2 seconds Final  . INR 07/28/2016 2.65   Final  . Sed Rate 07/28/2016 >140* 0 - 22 mm/hr Final  . CRP 07/28/2016 12.8* <1.0 mg/dL Final  . Specimen Description 07/29/2016 BLOOD LEFT HAND   Final  . Special Requests 07/29/2016 BOTTLES DRAWN AEROBIC ONLY Blood  Culture adequate volume   Final  . Culture 07/29/2016 NO GROWTH 5 DAYS   Final  . Report Status 07/29/2016 08/03/2016 FINAL   Final  . Specimen Description 07/29/2016 BLOOD RIGHT HAND   Final  . Special Requests 07/29/2016 BOTTLES DRAWN AEROBIC ONLY Blood Culture adequate volume   Final  . Culture 07/29/2016 NO GROWTH 5 DAYS   Final  . Report Status 07/29/2016 08/03/2016 FINAL   Final  . Prealbumin 07/29/2016 16.8* 18 - 38 mg/dL Final  . Prothrombin Time 07/29/2016 27.4* 11.4 - 15.2 seconds Final  . INR 07/29/2016 2.49   Final  . WBC 07/29/2016 11.5* 4.0 - 10.5 K/uL Final  . RBC 07/29/2016 2.72* 3.87 - 5.11 MIL/uL Final  . Hemoglobin 07/29/2016 7.9* 12.0 - 15.0 g/dL Final  . HCT 71/09/2692 23.4* 36.0 - 46.0 % Final  . MCV 07/29/2016 86.0  78.0 - 100.0 fL Final  . MCH 07/29/2016 29.0  26.0 - 34.0 pg Final  . MCHC 07/29/2016 33.8  30.0 - 36.0 g/dL Final  . RDW 85/46/2703 17.6* 11.5 - 15.5 % Final  . Platelets 07/29/2016 591* 150 -  400 K/uL Final  . Sodium 07/29/2016 135  135 - 145 mmol/L Final  . Potassium 07/29/2016 4.5  3.5 - 5.1 mmol/L Final  . Chloride 07/29/2016 102  101 - 111 mmol/L Final  . CO2 07/29/2016 23  22 - 32 mmol/L Final  . Glucose, Bld 07/29/2016 89  65 - 99 mg/dL Final  . BUN 16/01/9603 42* 6 - 20 mg/dL Final  . Creatinine, Ser 07/29/2016 1.15* 0.44 - 1.00 mg/dL Final  . Calcium 54/12/8117 9.8  8.9 - 10.3 mg/dL Final  . GFR calc non Af Amer 07/29/2016 40* >60 mL/min Final  . GFR calc Af Amer 07/29/2016 47* >60 mL/min Final   Comment: (NOTE) The eGFR has been calculated using the CKD EPI equation. This calculation has not been validated in all clinical situations. eGFR's persistently <60 mL/min signify possible Chronic Kidney Disease.   . Anion gap 07/29/2016 10  5 - 15 Final  . Glucose-Capillary 07/29/2016 96  65 - 99 mg/dL Final  . MRSA by PCR 14/78/2956 NEGATIVE  NEGATIVE Final   Comment:        The GeneXpert MRSA Assay (FDA approved for NASAL  specimens only), is one component of a comprehensive MRSA colonization surveillance program. It is not intended to diagnose MRSA infection nor to guide or monitor treatment for MRSA infections.   . Glucose-Capillary 07/29/2016 116* 65 - 99 mg/dL Final  . Glucose-Capillary 07/29/2016 168* 65 - 99 mg/dL Final  . Glucose-Capillary 07/29/2016 143* 65 - 99 mg/dL Final  . Comment 1 21/30/8657 Notify RN   Final  . Comment 2 07/29/2016 Document in Chart   Final  . Prothrombin Time 07/30/2016 25.5* 11.4 - 15.2 seconds Final  . INR 07/30/2016 2.28   Final  . Glucose-Capillary 07/29/2016 130* 65 - 99 mg/dL Final  . Glucose-Capillary 07/29/2016 177* 65 - 99 mg/dL Final  . Glucose-Capillary 07/30/2016 167* 65 - 99 mg/dL Final  . Glucose-Capillary 07/30/2016 170* 65 - 99 mg/dL Final  . Glucose-Capillary 07/30/2016 135* 65 - 99 mg/dL Final  . Glucose-Capillary 07/30/2016 131* 65 - 99 mg/dL Final  . Prothrombin Time 07/31/2016 24.4* 11.4 - 15.2 seconds Final  . INR 07/31/2016 2.16   Final  . WBC 07/31/2016 8.6  4.0 - 10.5 K/uL Final  . RBC 07/31/2016 3.12* 3.87 - 5.11 MIL/uL Final  . Hemoglobin 07/31/2016 8.9* 12.0 - 15.0 g/dL Final  . HCT 84/69/6295 26.9* 36.0 - 46.0 % Final  . MCV 07/31/2016 86.2  78.0 - 100.0 fL Final  . MCH 07/31/2016 28.5  26.0 - 34.0 pg Final  . MCHC 07/31/2016 33.1  30.0 - 36.0 g/dL Final  . RDW 28/41/3244 18.2* 11.5 - 15.5 % Final  . Platelets 07/31/2016 526* 150 - 400 K/uL Final  . Glucose-Capillary 07/30/2016 109* 65 - 99 mg/dL Final  . Glucose-Capillary 07/31/2016 153* 65 - 99 mg/dL Final  . Glucose-Capillary 07/31/2016 137* 65 - 99 mg/dL Final  . Glucose-Capillary 07/31/2016 138* 65 - 99 mg/dL Final  . Prothrombin Time 08/01/2016 24.1* 11.4 - 15.2 seconds Final  . INR 08/01/2016 2.13   Final  . WBC 08/01/2016 8.6  4.0 - 10.5 K/uL Final  . RBC 08/01/2016 3.55* 3.87 - 5.11 MIL/uL Final  . Hemoglobin 08/01/2016 10.2* 12.0 - 15.0 g/dL Final  . HCT 04/05/7251 30.7*  36.0 - 46.0 % Final  . MCV 08/01/2016 86.5  78.0 - 100.0 fL Final  . MCH 08/01/2016 28.7  26.0 - 34.0 pg Final  . MCHC 08/01/2016 33.2  30.0 - 36.0 g/dL Final  . RDW 65/78/4696 18.0* 11.5 - 15.5 % Final  . Platelets 08/01/2016 577* 150 - 400 K/uL Final  . Sodium 08/01/2016 131* 135 - 145 mmol/L Final  . Potassium 08/01/2016 4.7  3.5 - 5.1 mmol/L Final  . Chloride 08/01/2016 100* 101 - 111 mmol/L Final  . CO2 08/01/2016 24  22 - 32 mmol/L Final  . Glucose, Bld 08/01/2016 153* 65 - 99 mg/dL Final  . BUN 29/52/8413 29* 6 - 20 mg/dL Final  . Creatinine, Ser 08/01/2016 1.01* 0.44 - 1.00 mg/dL Final  . Calcium 24/40/1027 9.3  8.9 - 10.3 mg/dL Final  . GFR calc non Af Amer 08/01/2016 47* >60 mL/min Final  . GFR calc Af Amer 08/01/2016 55* >60 mL/min Final   Comment: (NOTE) The eGFR has been calculated using the CKD EPI equation. This calculation has not been validated in all clinical situations. eGFR's persistently <60 mL/min signify possible Chronic Kidney Disease.   . Anion gap 08/01/2016 7  5 - 15 Final  . Glucose-Capillary 07/31/2016 197* 65 - 99 mg/dL Final  . Comment 1 25/36/6440 Notify RN   Final  . Glucose-Capillary 08/01/2016 172* 65 - 99 mg/dL Final  . Comment 1 34/74/2595 Notify RN   Final  . Glucose-Capillary 08/01/2016 136* 65 - 99 mg/dL Final  . Glucose-Capillary 08/01/2016 181* 65 - 99 mg/dL Final  . Glucose-Capillary 08/01/2016 172* 65 - 99 mg/dL Final  Anti-coag visit on 07/26/2016  Component Date Value Ref Range Status  . INR 07/26/2016 2.7   Final   [facility]  Appointment on 07/21/2016  Component Date Value Ref Range Status  . TSH 07/21/2016 4.97* mIU/L Final   Comment:   Reference Range   > or = 20 Years  0.40-4.50   Pregnancy Range First trimester  0.26-2.66 Second trimester 0.55-2.73 Third trimester  0.43-2.91     Admission on 07/18/2016, Discharged on 07/18/2016  Component Date Value Ref Range Status  . Prothrombin Time 07/18/2016 25.6* 11.4 - 15.2 seconds  Final  . INR 07/18/2016 2.29   Final  . WBC 07/18/2016 14.7* 4.0 - 10.5 K/uL Final  . RBC 07/18/2016 3.18* 3.87 - 5.11 MIL/uL Final  . Hemoglobin 07/18/2016 9.4* 12.0 - 15.0 g/dL Final  . HCT 63/87/5643 27.3* 36.0 - 46.0 % Final  . MCV 07/18/2016 85.8  78.0 - 100.0 fL Final  . MCH 07/18/2016 29.6  26.0 - 34.0 pg Final  . MCHC 07/18/2016 34.4  30.0 - 36.0 g/dL Final  . RDW 32/95/1884 19.1* 11.5 - 15.5 % Final  . Platelets 07/18/2016 316  150 - 400 K/uL Final  . Neutrophils Relative % 07/18/2016 75  % Final  . Lymphocytes Relative 07/18/2016 14  % Final  . Monocytes Relative 07/18/2016 9  % Final  . Eosinophils Relative 07/18/2016 2  % Final  . Basophils Relative 07/18/2016 0  % Final  . Neutro Abs 07/18/2016 11.0* 1.7 - 7.7 K/uL Final  . Lymphs Abs 07/18/2016 2.1  0.7 - 4.0 K/uL Final  . Monocytes Absolute 07/18/2016 1.3* 0.1 - 1.0 K/uL Final  . Eosinophils Absolute 07/18/2016 0.3  0.0 - 0.7 K/uL Final  . Basophils Absolute 07/18/2016 0.0  0.0 - 0.1 K/uL Final  . Smear Review 07/18/2016 MORPHOLOGY UNREMARKABLE   Final  . Sodium 07/18/2016 134* 135 - 145 mmol/L Final  . Potassium 07/18/2016 4.6  3.5 - 5.1 mmol/L Final  . Chloride 07/18/2016 96* 101 - 111 mmol/L Final  . CO2 07/18/2016  26  22 - 32 mmol/L Final  . Glucose, Bld 07/18/2016 111* 65 - 99 mg/dL Final  . BUN 40/98/1191 36* 6 - 20 mg/dL Final  . Creatinine, Ser 07/18/2016 1.31* 0.44 - 1.00 mg/dL Final  . Calcium 47/82/9562 9.7  8.9 - 10.3 mg/dL Final  . GFR calc non Af Amer 07/18/2016 34* >60 mL/min Final  . GFR calc Af Amer 07/18/2016 40* >60 mL/min Final   Comment: (NOTE) The eGFR has been calculated using the CKD EPI equation. This calculation has not been validated in all clinical situations. eGFR's persistently <60 mL/min signify possible Chronic Kidney Disease.   . Anion gap 07/18/2016 12  5 - 15 Final  There may be more visits with results that are not included.    Dg Chest 2 View  Result Date:  09/26/2016 CLINICAL DATA:  Cough and shortness of breath. History of pneumonia. EXAM: CHEST  2 VIEW COMPARISON:  With 09/21/2016 and prior exam FINDINGS: Cardiomegaly, small left pleural effusion and left lower lung consolidation/atelectasis again noted. Minimal atelectasis within the right anterior lung again noted. Right basilar atelectasis is unchanged. There is no evidence of pneumothorax. No acute bony abnormalities are identified. IMPRESSION: Unchanged appearance of the chest with small left pleural effusion, left lower lung consolidation/atelectasis and right lung atelectasis. Electronically Signed   By: Harmon Pier M.D.   On: 09/26/2016 19:02   Dg Chest 2 View  Result Date: 09/21/2016 CLINICAL DATA:  Cough, shortness of breath today, history of pneumonia EXAM: CHEST  2 VIEW COMPARISON:  Chest x-ray of 09/17/2016 and 09/14/2016 FINDINGS: There has been improvement in opacity at the left lung base with a small left pleural effusion and mild left basilar atelectasis remaining. Vague opacity remains anteriorly on the lateral view probably in the right upper lobe anteriorly consistent with linear atelectasis or pneumonia. Cardiomegaly is stable. No acute bony abnormality is seen. IMPRESSION: 1. Improved aeration mild left basilar atelectasis and possibly small left effusion. 2. Mild linear atelectasis or residual pneumonia in the anterior right upper lobe. Electronically Signed   By: Dwyane Dee M.D.   On: 09/21/2016 08:01   Dg Chest 2 View  Result Date: 09/14/2016 CLINICAL DATA:  Shortness of breath EXAM: CHEST  2 VIEW COMPARISON:  Aug 16, 2016 FINDINGS: The heart size and mediastinal contours are within normal limits. There is minimal left pleural effusion. There is no focal pneumonia or pulmonary edema. The visualized skeletal structures are unremarkable. IMPRESSION: No focal pneumonia.  Minimal left pleural effusion. Electronically Signed   By: Sherian Rein M.D.   On: 09/14/2016 21:04   [place]  Chest  Result Date: 09/17/2016 CLINICAL DATA:  Acute shortness of breath. Pleural effusion by chest x-ray. Request evaluation for possible thoracentesis. EXAM: CHEST ULTRASOUND COMPARISON:  None. FINDINGS: Limited ultrasound of the left chest finds very small partially loculated effusion at the left lung base. There is also consolidated lung. These findings are not consistent with what was suggested on chest x-ray. IMPRESSION: Very small, partially loculated left pleural effusion. Insufficient for thoracentesis. Read by: Brayton El PA-C Electronically Signed   By: [name]   On: 09/17/2016 14:26   Dg Chest Port 1 View  Result Date: 09/17/2016 CLINICAL DATA:  Respiratory failure EXAM: PORTABLE CHEST 1 VIEW COMPARISON:  September 14, 2016 FINDINGS: No pneumothorax. The right lung is clear. Increasing effusion underlying opacity on the left. Shift of the heart and mediastinum to the left may represent some underlying volume loss is well.  No other interval changes. IMPRESSION: Increasing effusion and underlying opacity on the left with possible associated volume loss as above. Recommend follow-up to resolution. Electronically Signed   By: Gerome Sam III M.D   On: 09/17/2016 11:20     Assessment/Plan   ICD-10-CM   1. 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 Chronic diastolic CHF (congestive heart failure) (HCC) I50.32 CMP with eGFR    carvedilol (COREG) 6.25 MG tablet  3. Essential hypertension, benign I10   4. Paroxysmal atrial fibrillation (HCC) I48.0   5. Hypothyroidism, unspecified type E03.9   6. S/P amputation of lesser toe, left (HCC) Z89.422   7. PEG (percutaneous endoscopic gastrostomy) status (HCC) Z93.1   8. Chronic deep vein thrombosis (DVT) of tibial vein of left lower extremity (HCC) I82.542   9. Anemia, unspecified type D64.9 CBC with Differential/Platelets   Daily weight and record. Call cardiology if weight gain >2lbs in 24 hrs  or >5 lbs in 5 days  May use O2 (oxygen) as needed for shortness of breath  Continue current medications as ordered  Continue home health PT  Follow up with specialists as scheduled  INR managed by cardiology Dr Delton See  Wound care as ordered - continue wet-->dry dressings to amputated site  Follow up in 1 month for DM, peg tube, hyponatremia  Deondrick Searls S. Ancil Linsey  [facility] [address] [place], [place] [address] [phone] Cell (Monday-Friday 8 AM - 5 PM) [phone] After 5 PM and follow prompts

## 2016-10-03 NOTE — Telephone Encounter (Signed)
Sched appt 10/24/16 at 10:30. Spoke to pt's daughter.

## 2016-10-03 NOTE — Telephone Encounter (Signed)
-----   Message from Mena Goes, RN sent at 09/26/2016  1:57 PM EDT ----- Regarding: 2-3 weeks   ----- Message ----- From: Ansel Bong Sent: 09/26/2016   1:04 PM To: [facility] Charge Pool  S/p 2nd and 3rd toe amputation 09/25/16.  F/u with Dr. Donnetta Hutching in 2-3 weeks  Thanks Maudie Mercury

## 2016-10-05 ENCOUNTER — Telehealth: Payer: Self-pay

## 2016-10-05 ENCOUNTER — Ambulatory Visit (INDEPENDENT_AMBULATORY_CARE_PROVIDER_SITE_OTHER): Payer: Medicare Other | Admitting: Pharmacist

## 2016-10-05 DIAGNOSIS — I2510 Atherosclerotic heart disease of native coronary artery without angina pectoris: Secondary | ICD-10-CM | POA: Diagnosis not present

## 2016-10-05 DIAGNOSIS — E11621 Type 2 diabetes mellitus with foot ulcer: Secondary | ICD-10-CM | POA: Diagnosis not present

## 2016-10-05 DIAGNOSIS — L97523 Non-pressure chronic ulcer of other part of left foot with necrosis of muscle: Secondary | ICD-10-CM | POA: Diagnosis not present

## 2016-10-05 DIAGNOSIS — L97521 Non-pressure chronic ulcer of other part of left foot limited to breakdown of skin: Secondary | ICD-10-CM | POA: Diagnosis not present

## 2016-10-05 DIAGNOSIS — Z5181 Encounter for therapeutic drug level monitoring: Secondary | ICD-10-CM

## 2016-10-05 DIAGNOSIS — J441 Chronic obstructive pulmonary disease with (acute) exacerbation: Secondary | ICD-10-CM | POA: Diagnosis not present

## 2016-10-05 DIAGNOSIS — I130 Hypertensive heart and chronic kidney disease with heart failure and stage 1 through stage 4 chronic kidney disease, or unspecified chronic kidney disease: Secondary | ICD-10-CM | POA: Diagnosis not present

## 2016-10-05 LAB — POCT INR: INR: 1.1

## 2016-10-05 MED ORDER — HYDRALAZINE HCL 25 MG PO TABS: 25.0000 mg | ORAL_TABLET | Freq: Two times a day (BID) | ORAL | 6 refills | Status: DC

## 2016-10-05 NOTE — Telephone Encounter (Signed)
-----   Message from [place], [place] sent at 10/05/2016 11:46 AM EDT ----- Please start hydralazine 25mg  #60 take 1 tab po BID with 6 RF; check blood pressure 2 times daily and record; call if remains >160/80

## 2016-10-05 NOTE — Telephone Encounter (Signed)
Rx was sent to pharmacy. 

## 2016-10-06 ENCOUNTER — Telehealth: Payer: Self-pay

## 2016-10-06 NOTE — Telephone Encounter (Signed)
Patient had a evaluation with occupational therapy thru [facility]. Patients daughter stated that her mother didn't need it and declined the services.

## 2016-10-10 ENCOUNTER — Other Ambulatory Visit: Payer: Self-pay | Admitting: Cardiology

## 2016-10-10 DIAGNOSIS — I2510 Atherosclerotic heart disease of native coronary artery without angina pectoris: Secondary | ICD-10-CM | POA: Diagnosis not present

## 2016-10-10 DIAGNOSIS — L97523 Non-pressure chronic ulcer of other part of left foot with necrosis of muscle: Secondary | ICD-10-CM | POA: Diagnosis not present

## 2016-10-10 DIAGNOSIS — E11621 Type 2 diabetes mellitus with foot ulcer: Secondary | ICD-10-CM | POA: Diagnosis not present

## 2016-10-10 DIAGNOSIS — L97521 Non-pressure chronic ulcer of other part of left foot limited to breakdown of skin: Secondary | ICD-10-CM | POA: Diagnosis not present

## 2016-10-10 DIAGNOSIS — J441 Chronic obstructive pulmonary disease with (acute) exacerbation: Secondary | ICD-10-CM | POA: Diagnosis not present

## 2016-10-10 DIAGNOSIS — I130 Hypertensive heart and chronic kidney disease with heart failure and stage 1 through stage 4 chronic kidney disease, or unspecified chronic kidney disease: Secondary | ICD-10-CM | POA: Diagnosis not present

## 2016-10-10 LAB — HM DIABETES EYE EXAM

## 2016-10-11 ENCOUNTER — Ambulatory Visit (INDEPENDENT_AMBULATORY_CARE_PROVIDER_SITE_OTHER): Payer: Medicare Other | Admitting: Podiatry

## 2016-10-11 ENCOUNTER — Encounter: Payer: Self-pay | Admitting: Vascular Surgery

## 2016-10-11 DIAGNOSIS — M79676 Pain in unspecified toe(s): Secondary | ICD-10-CM

## 2016-10-11 DIAGNOSIS — I70245 Atherosclerosis of native arteries of left leg with ulceration of other part of foot: Secondary | ICD-10-CM

## 2016-10-11 DIAGNOSIS — E0842 Diabetes mellitus due to underlying condition with diabetic polyneuropathy: Secondary | ICD-10-CM

## 2016-10-11 DIAGNOSIS — B351 Tinea unguium: Secondary | ICD-10-CM

## 2016-10-11 DIAGNOSIS — L97522 Non-pressure chronic ulcer of other part of left foot with fat layer exposed: Secondary | ICD-10-CM

## 2016-10-12 ENCOUNTER — Ambulatory Visit (INDEPENDENT_AMBULATORY_CARE_PROVIDER_SITE_OTHER): Payer: Medicare Other | Admitting: Cardiovascular Disease

## 2016-10-12 DIAGNOSIS — L97523 Non-pressure chronic ulcer of other part of left foot with necrosis of muscle: Secondary | ICD-10-CM | POA: Diagnosis not present

## 2016-10-12 DIAGNOSIS — I2510 Atherosclerotic heart disease of native coronary artery without angina pectoris: Secondary | ICD-10-CM | POA: Diagnosis not present

## 2016-10-12 DIAGNOSIS — E11621 Type 2 diabetes mellitus with foot ulcer: Secondary | ICD-10-CM | POA: Diagnosis not present

## 2016-10-12 DIAGNOSIS — L97521 Non-pressure chronic ulcer of other part of left foot limited to breakdown of skin: Secondary | ICD-10-CM | POA: Diagnosis not present

## 2016-10-12 DIAGNOSIS — I130 Hypertensive heart and chronic kidney disease with heart failure and stage 1 through stage 4 chronic kidney disease, or unspecified chronic kidney disease: Secondary | ICD-10-CM | POA: Diagnosis not present

## 2016-10-12 DIAGNOSIS — J441 Chronic obstructive pulmonary disease with (acute) exacerbation: Secondary | ICD-10-CM | POA: Diagnosis not present

## 2016-10-12 DIAGNOSIS — Z5181 Encounter for therapeutic drug level monitoring: Secondary | ICD-10-CM

## 2016-10-12 LAB — POCT INR: INR: 1.9

## 2016-10-13 DIAGNOSIS — I130 Hypertensive heart and chronic kidney disease with heart failure and stage 1 through stage 4 chronic kidney disease, or unspecified chronic kidney disease: Secondary | ICD-10-CM | POA: Diagnosis not present

## 2016-10-13 DIAGNOSIS — I2510 Atherosclerotic heart disease of native coronary artery without angina pectoris: Secondary | ICD-10-CM | POA: Diagnosis not present

## 2016-10-13 DIAGNOSIS — E11621 Type 2 diabetes mellitus with foot ulcer: Secondary | ICD-10-CM | POA: Diagnosis not present

## 2016-10-13 DIAGNOSIS — J441 Chronic obstructive pulmonary disease with (acute) exacerbation: Secondary | ICD-10-CM | POA: Diagnosis not present

## 2016-10-13 DIAGNOSIS — L97521 Non-pressure chronic ulcer of other part of left foot limited to breakdown of skin: Secondary | ICD-10-CM | POA: Diagnosis not present

## 2016-10-13 DIAGNOSIS — L97523 Non-pressure chronic ulcer of other part of left foot with necrosis of muscle: Secondary | ICD-10-CM | POA: Diagnosis not present

## 2016-10-16 ENCOUNTER — Ambulatory Visit (INDEPENDENT_AMBULATORY_CARE_PROVIDER_SITE_OTHER): Payer: Medicare Other | Admitting: Nurse Practitioner

## 2016-10-16 ENCOUNTER — Encounter: Payer: Self-pay | Admitting: Nurse Practitioner

## 2016-10-16 VITALS — BP 142/68 | HR 98 | Temp 97.5°F | Resp 66 | Ht 66.0 in | Wt 148.6 lb

## 2016-10-16 DIAGNOSIS — B372 Candidiasis of skin and nail: Secondary | ICD-10-CM | POA: Diagnosis not present

## 2016-10-16 DIAGNOSIS — I2510 Atherosclerotic heart disease of native coronary artery without angina pectoris: Secondary | ICD-10-CM | POA: Diagnosis not present

## 2016-10-16 DIAGNOSIS — J449 Chronic obstructive pulmonary disease, unspecified: Secondary | ICD-10-CM | POA: Diagnosis not present

## 2016-10-16 DIAGNOSIS — Z931 Gastrostomy status: Secondary | ICD-10-CM | POA: Diagnosis not present

## 2016-10-16 DIAGNOSIS — L97523 Non-pressure chronic ulcer of other part of left foot with necrosis of muscle: Secondary | ICD-10-CM | POA: Diagnosis not present

## 2016-10-16 DIAGNOSIS — J441 Chronic obstructive pulmonary disease with (acute) exacerbation: Secondary | ICD-10-CM | POA: Diagnosis not present

## 2016-10-16 DIAGNOSIS — I70262 Atherosclerosis of native arteries of extremities with gangrene, left leg: Secondary | ICD-10-CM

## 2016-10-16 DIAGNOSIS — E11621 Type 2 diabetes mellitus with foot ulcer: Secondary | ICD-10-CM | POA: Diagnosis not present

## 2016-10-16 DIAGNOSIS — L97521 Non-pressure chronic ulcer of other part of left foot limited to breakdown of skin: Secondary | ICD-10-CM | POA: Diagnosis not present

## 2016-10-16 DIAGNOSIS — M940 Chondrocostal junction syndrome [Tietze]: Secondary | ICD-10-CM | POA: Diagnosis not present

## 2016-10-16 DIAGNOSIS — I130 Hypertensive heart and chronic kidney disease with heart failure and stage 1 through stage 4 chronic kidney disease, or unspecified chronic kidney disease: Secondary | ICD-10-CM | POA: Diagnosis not present

## 2016-10-16 MED ORDER — PREDNISONE 10 MG (21) PO TBPK: ORAL_TABLET | ORAL | 0 refills | Status: DC

## 2016-10-16 NOTE — Patient Instructions (Addendum)
Will do prednisone dose pack To use tylenol 2 tablets of 325 mg by mouth three times daily as needed To use heat 3 times daily 20-30 mins.   Costochondritis Costochondritis is swelling and irritation (inflammation) of the tissue (cartilage) that connects your ribs to your breastbone (sternum). This causes pain in the front of your chest. The pain usually starts gradually and involves more than one rib. What are the causes? The exact cause of this condition is not always known. It results from stress on the cartilage where your ribs attach to your sternum. The cause of this stress could be:  Chest injury (trauma).  Exercise or activity, such as lifting.  Severe coughing.  What increases the risk? You may be at higher risk for this condition if you:  Are female.  Are 81?81 years old.  Recently started a new exercise or work activity.  Have low levels of vitamin D.  Have a condition that makes you cough frequently.  What are the signs or symptoms? The main symptom of this condition is chest pain. The pain:  Usually starts gradually and can be sharp or dull.  Gets worse with deep breathing, coughing, or exercise.  Gets better with rest.  May be worse when you press on the sternum-rib connection (tenderness).  How is this diagnosed? This condition is diagnosed based on your symptoms, medical history, and a physical exam. Your health care provider will check for tenderness when pressing on your sternum. This is the most important finding. You may also have tests to rule out other causes of chest pain. These may include:  A chest X-ray to check for lung problems.  An electrocardiogram (ECG) to see if you have a heart problem that could be causing the pain.  An imaging scan to rule out a chest or rib fracture.  How is this treated? This condition usually goes away on its own over time. Your health care provider may prescribe an NSAID to reduce pain and inflammation. Your  health care provider may also suggest that you:  Rest and avoid activities that make pain worse.  Apply heat or cold to the area to reduce pain and inflammation.  Do exercises to stretch your chest muscles.  If these treatments do not help, your health care provider may inject a numbing medicine at the sternum-rib connection to help relieve the pain. Follow these instructions at home:  Avoid activities that make pain worse. This includes any activities that use chest, abdominal, and side muscles.  If directed, put ice on the painful area: ? Put ice in a plastic bag. ? Place a towel between your skin and the bag. ? Leave the ice on for 20 minutes, 2-3 times a day.  If directed, apply heat to the affected area as often as told by your health care provider. Use the heat source that your health care provider recommends, such as a moist heat pack or a heating pad. ? Place a towel between your skin and the heat source. ? Leave the heat on for 20-30 minutes. ? Remove the heat if your skin turns bright red. This is especially important if you are unable to feel pain, heat, or cold. You may have a greater risk of getting burned.  Take over-the-counter and prescription medicines only as told by your health care provider.  Return to your normal activities as told by your health care provider. Ask your health care provider what activities are safe for you.  Keep all follow-up  visits as told by your health care provider. This is important. Contact a health care provider if:  You have chills or a fever.  Your pain does not go away or it gets worse.  You have a cough that does not go away (is persistent). Get help right away if:  You have shortness of breath. This information is not intended to replace advice given to you by your health care provider. Make sure you discuss any questions you have with your health care provider. Document Released: 12/28/2004 Document Revised: 10/08/2015  Document Reviewed: 07/14/2015 Elsevier Interactive Patient Education  [employer].

## 2016-10-16 NOTE — Progress Notes (Signed)
Careteam: Patient Care Team: Gildardo Cranker, DO as PCP - General (Internal Medicine) Riccardo Dubin, MD as Referring Physician (Cardiology) Alveta Heimlich, MD as Referring Physician (Ophthalmology) Edrick Kins, DPM as Consulting Physician (Podiatry)  Advanced Directive information Does Patient Have a Medical Advance Directive?: Yes, Type of Advance Directive: Healthcare Power of Attorney  Allergies  Allergen Reactions  . Penicillins Itching, Rash and Other (See Comments)    Tolerated unasyn, zosyn & cephalosporins in the past.  Has patient had a PCN reaction causing immediate rash, facial/tongue/throat swelling, SOB or lightheadedness with hypotension: Yes Has patient had a PCN reaction causing severe rash involving mucus membranes or skin necrosis: Unk Has patient had a PCN reaction that required hospitalization: Unk Has patient had a PCN reaction occurring within the last 10 years: Unk If all of the above answers are "NO", then may proceed with Cephalosporins  . Other     Patient tolerates amoxicillin and cephalosporins    Chief Complaint  Patient presents with  . Acute Visit    Pt is being seen due to having a rash under left breast and feeding tube area. Pt states rash is itchy, burns and causes pain around side to back. Rash first appeared 2 weeks ago.      HPI: Patient is a 83 y.o. female seen in the office today due to painful rash Pt with complex medical hx. Hx of depression, arthritis, CVA, PAD, COPD/asthma, glaucoma, hyponatremia, DM, GOO, HTN, afib, hyperlipidemia, seizures and others. daughter thought pt was having pain under breast but pt reports different issues Pt here with several complaints Pt states Itching under breast- better  Itching around PEG site-better since changing tape to dressing Pain Is to left upper chest going around to back started during hospitalization. Pt reports this was 2 weeks ago however hospitalization was ~1 month  ago.  Also pain to her left shoulder (this is chronic) Pain level to upper chest and around to back is  5-6/10 movement makes it worse 7-8/10 Using tylenol and biofreeze   Sometimes tylenol will take pain completely away but it always come back. Took tylenol last week but none this week.  Had Rx for oxycodone but never took it.  Used heat in the past but does not feel like it made a lot of difference. Helped some.  Pain has been going on since she was in the hospital for COPD excerebration with PNA. She was also worked up for pleuritic CP during hospitalization. Pt reports lot of coughing around when pain started due to COPD excerebration.  Feels like overall she thinks pain may be getting worse (since hospital) but daughter reports she was on pain medication during the hospital and now she is not. No rash or redness noted.  No worsening of shortness of breath, cough or congestion.  Review of Systems:  Review of Systems  Constitutional: Positive for malaise/fatigue and weight loss. Negative for chills and fever.  HENT: Negative for sore throat and tinnitus.   Eyes: Negative for blurred vision and double vision.  Respiratory: Negative for cough, shortness of breath and wheezing.   Cardiovascular: Positive for chest pain. Negative for palpitations, orthopnea and leg swelling.  Gastrointestinal: Negative for abdominal pain, constipation, diarrhea, nausea and vomiting.  Genitourinary: Negative for dysuria, frequency, hematuria and urgency.  Musculoskeletal: Positive for back pain and joint pain. Negative for falls and myalgias.  Skin: Positive for itching (has improved). Negative for rash.  Neurological: Negative for focal weakness, seizures,  loss of consciousness and weakness.    Past Medical History:  Diagnosis Date  . Anemia    previous blood transfusions  . Arthritis    "all over"  . Asthma   . Bradycardia    requiring previous d/c of BB and reduction of amiodarone  . CAD (coronary  artery disease)    nonobstructive per notes  . Chronic diastolic CHF (congestive heart failure) ([place])   . CKD (chronic kidney disease), stage III   . Complication of blood transfusion    "got the wrong blood type at [facility] in ~ 2015; no adverse reaction that we are aware of"/daughter, [name] (01/27/2016)  . COPD (chronic obstructive pulmonary disease) ([place])   . Depression    "light case"  . DVT (deep venous thrombosis) ([place]) 01/2016   a. LLE DVT 01/2016 - switched from Eliquis to Coumadin.  . Gastric stenosis    a. s/p stomach tube  . GERD (gastroesophageal reflux disease)   . History of blood transfusion    "several" (01/27/2016)  . History of stomach ulcers   . Hyperlipidemia   . Hypertension   . Hypothyroidism   . Paraesophageal hernia   . Perforated gastric ulcer ([place])   . Seasonal allergies   . SIADH (syndrome of inappropriate ADH production) ([place])    [name] 01/10/2015  . Small bowel obstruction ([place])    "I don't know how many" (01/11/2015)  . Stroke ([place])    "light one"  . Type II diabetes mellitus ([place])    "related to prednisone use  for > 20 yr; once predinose stopped; no more DM RX" (01/27/2016)  . Ventral hernia with bowel obstruction    Past Surgical History:  Procedure Laterality Date  . ABDOMINAL AORTOGRAM N/A 09/21/2016   Procedure: Abdominal Aortogram;  Surgeon: Waynetta Sandy, MD;  Location: [facility];  Service: Cardiovascular;  Laterality: N/A;  . AMPUTATION Left 09/25/2016   Procedure: AMPUTATION DIGIT- LEFT 2ND AND 3RD TOES;  Surgeon: Rosetta Posner, MD;  Location: [place];  Service: Vascular;  Laterality: Left;  . CATARACT EXTRACTION W/ INTRAOCULAR LENS  IMPLANT, BILATERAL    . CHOLECYSTECTOMY OPEN    . COLECTOMY    . ESOPHAGOGASTRODUODENOSCOPY N/A 01/19/2014   Procedure: ESOPHAGOGASTRODUODENOSCOPY (EGD);  Surgeon: Irene Shipper, MD;  Location: [facility];  Service: Endoscopy;  Laterality: N/A;  . ESOPHAGOGASTRODUODENOSCOPY N/A  01/20/2014   Procedure: ESOPHAGOGASTRODUODENOSCOPY (EGD);  Surgeon: Irene Shipper, MD;  Location: [facility];  Service: Endoscopy;  Laterality: N/A;  . ESOPHAGOGASTRODUODENOSCOPY N/A 03/19/2014   Procedure: ESOPHAGOGASTRODUODENOSCOPY (EGD);  Surgeon: Milus Banister, MD;  Location: [facility];  Service: Endoscopy;  Laterality: N/A;  . ESOPHAGOGASTRODUODENOSCOPY N/A 07/08/2015   Procedure: ESOPHAGOGASTRODUODENOSCOPY (EGD);  Surgeon: Doran Stabler, MD;  Location: [facility];  Service: Endoscopy;  Laterality: N/A;  . ESOPHAGOGASTRODUODENOSCOPY (EGD) WITH PROPOFOL N/A 09/15/2015   Procedure: ESOPHAGOGASTRODUODENOSCOPY (EGD) WITH PROPOFOL;  Surgeon: Manus Gunning, MD;  Location: [facility];  Service: Gastroenterology;  Laterality: N/A;  . GASTROJEJUNOSTOMY     hx/notes 01/10/2015  . GASTROJEJUNOSTOMY N/A 09/23/2015   Procedure: OPEN GASTROJEJUNOSTOMY TUBE PLACEMENT;  Surgeon: Arta Bruce Kinsinger, MD;  Location: [facility];  Service: General;  Laterality: N/A;  . GLAUCOMA SURGERY Bilateral   . HERNIA REPAIR  2015  . IR GENERIC HISTORICAL  01/07/2016   IR GJ TUBE CHANGE 01/07/2016 Jacqulynn Cadet, MD [facility]  . IR GENERIC HISTORICAL  01/27/2016   IR MECH REMOV OBSTRUC MAT ANY COLON  TUBE W/FLUORO 01/27/2016 Markus Daft, MD [facility]  . IR GENERIC HISTORICAL  02/07/2016   IR PATIENT EVAL TECH 0-60 MINS Darrell K Allred, PA-C [facility]  . IR GENERIC HISTORICAL  02/08/2016   IR GJ TUBE CHANGE 02/08/2016 Greggory Keen, MD [facility]  . IR GENERIC HISTORICAL  01/06/2016   IR GJ TUBE CHANGE 01/06/2016 [facility] OUT REF  . IR GENERIC HISTORICAL  05/02/2016   IR CM INJ ANY COLONIC TUBE W/FLUORO 05/02/2016 Arne Cleveland, MD [facility]  . IR GENERIC HISTORICAL  05/15/2016   IR GJ TUBE CHANGE 05/15/2016 Sandi Mariscal, MD [facility]  . IR GENERIC HISTORICAL  06/28/2016   IR GJ TUBE CHANGE 06/28/2016 [facility]  . LAPAROTOMY N/A 01/20/2015   Procedure: EXPLORATORY LAPAROTOMY;  Surgeon:  Coralie Keens, MD;  Location: [place];  Service: General;  Laterality: N/A;  . LOWER EXTREMITY ANGIOGRAPHY Left 09/21/2016   Procedure: Lower Extremity Angiography;  Surgeon: Waynetta Sandy, MD;  Location: [facility];  Service: Cardiovascular;  Laterality: Left;  . LYSIS OF ADHESION N/A 01/20/2015   Procedure: LYSIS OF ADHESIONS < 1 HOUR;  Surgeon: Coralie Keens, MD;  Location: [place];  Service: General;  Laterality: N/A;  . PERIPHERAL VASCULAR BALLOON ANGIOPLASTY Left 09/21/2016   Procedure: Peripheral Vascular Balloon Angioplasty;  Surgeon: Waynetta Sandy, MD;  Location: [facility];  Service: Cardiovascular;  Laterality: Left;  drug coated balloon  . TONSILLECTOMY    . TUBAL LIGATION    . VENTRAL HERNIA REPAIR  2015   incarcerated ventral hernia ([facility] 09/2013)/notes 01/10/2015   Social History:   reports that she has never smoked. She has quit using smokeless tobacco. Her smokeless tobacco use included Snuff. She reports that she does not drink alcohol or use drugs.  Family History  Problem Relation Age of Onset  . Stroke Mother   . Hypertension Mother   . Diabetes Brother   . Heart attack Neg Hx     Medications: Patient's Medications  New Prescriptions   No medications on file  Previous Medications   ACETAMINOPHEN (TYLENOL) 500 MG TABLET    Take 1,000 mg by mouth every 6 (six) hours as needed (pain).   AMINO ACIDS-PROTEIN HYDROLYS PO    Take 30 mLs by mouth 2 (two) times daily.   ATORVASTATIN (LIPITOR) 10 MG TABLET    TAKE ONE TABLET BY MOUTH ONCE DAILY   B COMPLEX VITAMINS TABLET    Take 1 tablet by mouth daily.    BENZONATATE (TESSALON PERLES) 100 MG CAPSULE    Take two capsules by mouth three times daily as needed for cough   CALCIUM-VITAMIN D (OSCAL WITH D) 500-200 MG-UNIT PER TABLET    Take 1 tablet by mouth daily with breakfast.    CARVEDILOL (COREG) 6.25 MG TABLET    Take 1 tablet (6.25 mg total) by mouth 2 (two) times daily.    CHLORHEXIDINE (PERIDEX) 0.12 % SOLUTION    Use as directed 15 mLs in the mouth or throat 2 (two) times daily.   CLOPIDOGREL (PLAVIX) 75 MG TABLET    Take 1 tablet (75 mg total) by mouth daily with breakfast.   FEEDING SUPPLEMENT (OSMOLITE 1 CAL) LIQD    Take 237 mLs by mouth at bedtime.   FLUTICASONE (FLONASE) 50 MCG/ACT NASAL SPRAY    Place 2 sprays into both nostrils daily.   GABAPENTIN (NEURONTIN) 300 MG CAPSULE    Take 600 mg by mouth 3 (three) times daily.  HYDRALAZINE (APRESOLINE) 25 MG TABLET    Take 1 tablet (25 mg total) by mouth 2 (two) times daily.   INSULIN ASPART (NOVOLOG FLEXPEN) 100 UNIT/ML FLEXPEN    Before each meal 3 times a day, 140-199 - 2 units, 200-250 - 4 units, 251-299 - 6 units,  300-349 - 8 units,  350 or above 10 units. Insulin PEN if approved, provide syringes and needles if needed.   INSULIN GLARGINE (LANTUS) 100 UNIT/ML INJECTION    Inject 0.1 mLs (10 Units total) into the skin daily.   IPRATROPIUM-ALBUTEROL (DUONEB) 0.5-2.5 (3) MG/3ML SOLN    Take 3 mLs by nebulization every 6 (six) hours as needed (sob). Use 3 times daily x 4 days then every 6 hours as needed.   ISOSORBIDE MONONITRATE (IMDUR) 30 MG 24 HR TABLET    Take 30 mg by mouth daily.   LATANOPROST (XALATAN) 0.005 % OPHTHALMIC SOLUTION    Place 1 drop into both eyes at bedtime.   LEVALBUTEROL (XOPENEX HFA) 45 MCG/ACT INHALER    Inhale 2 puffs into the lungs every 6 (six) hours as needed for wheezing or shortness of breath.   LEVOTHYROXINE (SYNTHROID, LEVOTHROID) 137 MCG TABLET    TAKE 1 TABLET (137 MCG TOTAL) BY MOUTH DAILY BEFORE BREAKFAST.   MALTODEXTRIN-XANTHAN GUM (RESOURCE THICKENUP CLEAR) POWD    Take 120 g by mouth as needed (use to thicken liquids.).   MOMETASONE-FORMOTEROL (DULERA) 200-5 MCG/ACT AERO    Inhale 2 puffs into the lungs 2 (two) times daily.   MULTIPLE VITAMIN (MULTIVITAMIN WITH MINERALS) TABS TABLET    Take 1 tablet by mouth daily.   NYSTATIN (MYCOSTATIN/NYSTOP) POWDER    Apply topically 3  (three) times daily.   POLYETHYL GLYCOL-PROPYL GLYCOL 0.4-0.3 % SOLN    Place 1 drop into both eyes 4 (four) times daily.    POLYETHYLENE GLYCOL (MIRALAX / GLYCOLAX) PACKET    Take 17 g by mouth daily as needed for mild constipation.   RESTASIS 0.05 % OPHTHALMIC EMULSION    USE 1 DROP INTO BOTH EYES TWICE DAILY   SERTRALINE (ZOLOFT) 100 MG TABLET    TAKE ONE TABLET BY MOUTH ONCE DAILY FOR DEPRESSION   WARFARIN (COUMADIN) 2.5 MG TABLET    TAKE AS DIRECTED BY COUMADIN CLINIC.   WATER FOR IRRIGATION, STERILE (FREE WATER) SOLN    Place 230 mLs into feeding tube 3 (three) times daily.  Modified Medications   No medications on file  Discontinued Medications   GABAPENTIN (NEURONTIN) 300 MG CAPSULE    1 tablet by mouth three times daily, may have additional dose every 8 hours as needed pain   OXYCODONE-ACETAMINOPHEN (PERCOCET/ROXICET) 5-325 MG TABLET    Take 1-2 tablets by mouth every 3 (three) hours as needed for moderate pain.     Physical Exam:  Vitals:   10/16/16 1150  BP: (!) 142/68  Pulse: 98  Resp: (!) 66  Temp: (!) 97.5 F (36.4 C)  TempSrc: Oral  SpO2: 98%  Weight: 148 lb 9.6 oz (67.4 kg)  Height: 5\' 6"  (1.676 m)   Body mass index is 23.98 kg/m.  Physical Exam  Constitutional: She is oriented to person, place, and time. She appears well-developed.  Frail appearing, sitting upright in chair, no conversational dyspnea. Daughter present  HENT:  Mouth/Throat: Oropharynx is clear and moist. No oropharyngeal exudate.  MMM  Eyes: Pupils are equal, round, and reactive to light. No scleral icterus.  Neck: Neck supple. Carotid bruit is not present. No tracheal deviation  present. No thyromegaly present.  Cardiovascular: Regular rhythm.  Bradycardia present.  Exam reveals no gallop and no friction rub.   Murmur heard. Pulses:      Dorsalis pedis pulses are 1+ on the right side, and 1+ on the left side.       Posterior tibial pulses are 1+ on the right side, and 1+ on the left side.    Pulmonary/Chest: Effort normal. No stridor. No respiratory distress. She has wheezes (end expiratory b/l). She has no rales.  Abdominal: Soft. Bowel sounds are normal. She exhibits no distension and no mass. There is no hepatomegaly. There is no tenderness. There is no rebound and no guarding.  GJ tube intact with no signs of redness or minimal d/c at insertion site.   Musculoskeletal: She exhibits edema and deformity.       Arms: Point tenderness noted to left upper chest between ribs around side of body under left arm and back. No rash, redness or ecchymosis noted.  Breast exam done and without abnormal finding, slight tenderness noted bilaterally   Lymphadenopathy:    She has no cervical adenopathy.  Neurological: She is alert and oriented to person, place, and time. Gait (uses rolling walker) abnormal.  Skin: Skin is warm and dry. No rash noted.  Left foot dsg c/d/i; soft shoe intact  Psychiatric: She has a normal mood and affect. Her behavior is normal. Judgment and thought content normal.    Labs reviewed: Basic Metabolic Panel:  Recent Labs  06/08/16 1523  07/21/16 1124  08/15/16 0545 08/16/16 0616  08/18/16 0517  09/25/16 0524 09/26/16 0435 10/03/16 1610  NA 135  < >  --   < > 136 141  < > 135  < > 136 133* 134*  K 4.4  < >  --   < > 3.3* 4.2  < > 4.0  < > 4.6 4.4 4.7  CL 99*  < >  --   < > 102 107  < > 101  < > 105 103 97*  CO2 29  < >  --   < > 23 25  < > 24  < > 25 25 27   GLUCOSE 148*  < >  --   < > 451* 350*  < > 154*  < > 106* 232* 102*  BUN 54*  < >  --   < > 64* 54*  < > 49*  < > 32* 35* 32*  CREATININE 1.97*  < >  --   < > 1.11* 1.04*  < > 0.96  < > 1.03* 1.22* 1.10*  CALCIUM 8.7*  < >  --   < > 9.3 9.4  < > 9.1  < > 8.7* 8.3* 9.1  MG 1.9  < >  --   < > 2.1 1.9  --  1.9  --   --   --   --   PHOS  --   < >  --   < > 2.4* 2.2*  --   --   --  3.5  --   --   TSH 10.017*  --  4.97*  --   --   --   --  0.670  --   --   --   --   < > = values in this interval not  displayed. Liver Function Tests:  Recent Labs  09/22/16 0409 09/25/16 0524 09/26/16 0435 10/03/16 1610  AST 30  --  25 20  ALT  25  --  31 25  ALKPHOS 61  --  57 73  BILITOT 0.4  --  0.4 0.4  PROT 5.9*  --  5.4* 7.1  ALBUMIN 2.2* 2.2* 2.0* 3.2*    Recent Labs  11/06/15 1612  LIPASE 21   No results for input(s): AMMONIA in the last 8760 hours. CBC:  Recent Labs  09/19/16 1104  09/25/16 0524 09/26/16 0435 10/03/16 1610  WBC 18.1*  < > 17.2* 11.4* 5.8  NEUTROABS 15.9*  --   --  8.5* 3,190  HGB 9.8*  < > 9.1* 8.0* 10.5*  HCT 30.4*  < > 27.9* 25.1* 33.2*  MCV 88.4  < > 86.6 89.6 88.5  PLT 361  < > 310 263 358  < > = values in this interval not displayed. Lipid Panel: No results for input(s): CHOL, HDL, LDLCALC, TRIG, CHOLHDL, LDLDIRECT in the last 8760 hours. TSH:  Recent Labs  06/08/16 1523 07/21/16 1124 08/18/16 0517  TSH 10.017* 4.97* 0.670   A1C: Lab Results  Component Value Date   HGBA1C 7.7 (H) 08/16/2016     Assessment/Plan 1. Costochondritis - predniSONE (STERAPRED UNI-PAK 21 TAB) 10 MG (21) TBPK tablet; Use as directed  Dispense: 21 tablet; Refill: 0 - to use heat as needed -can use tylenol schedule for pain during prednisone taper, then as needed- also has ultram if needed   2. COPD with asthma ([place]) Stable at this time. Suspect costochondritis due to excessive coughing with recent excerebration. Cont current regimen   3. PEG (percutaneous endoscopic gastrostomy) status (H[facility]) Stable, improvement in itching after changing tape.   4. Yeast dermatitis Hx of yeast under breast but NONE today. Can use nystatin powder as needed  To keep follow up as scheduled, or follow up sooner if needed  Breindel Collier K. Harle Battiest  [facility] [phone] 8 am - 5 pm) [phone] (after hours)

## 2016-10-16 NOTE — Telephone Encounter (Signed)
This encounter was created in error - please disregard.

## 2016-10-17 ENCOUNTER — Encounter: Payer: Self-pay | Admitting: Nurse Practitioner

## 2016-10-18 ENCOUNTER — Other Ambulatory Visit (HOSPITAL_COMMUNITY): Payer: Self-pay | Admitting: Interventional Radiology

## 2016-10-18 ENCOUNTER — Telehealth: Payer: Self-pay | Admitting: Pharmacist

## 2016-10-18 ENCOUNTER — Telehealth: Payer: Self-pay | Admitting: Student

## 2016-10-18 DIAGNOSIS — R633 Feeding difficulties, unspecified: Secondary | ICD-10-CM

## 2016-10-18 DIAGNOSIS — J441 Chronic obstructive pulmonary disease with (acute) exacerbation: Secondary | ICD-10-CM | POA: Diagnosis not present

## 2016-10-18 DIAGNOSIS — L97523 Non-pressure chronic ulcer of other part of left foot with necrosis of muscle: Secondary | ICD-10-CM | POA: Diagnosis not present

## 2016-10-18 DIAGNOSIS — I2510 Atherosclerotic heart disease of native coronary artery without angina pectoris: Secondary | ICD-10-CM | POA: Diagnosis not present

## 2016-10-18 DIAGNOSIS — E11621 Type 2 diabetes mellitus with foot ulcer: Secondary | ICD-10-CM | POA: Diagnosis not present

## 2016-10-18 DIAGNOSIS — L97521 Non-pressure chronic ulcer of other part of left foot limited to breakdown of skin: Secondary | ICD-10-CM | POA: Diagnosis not present

## 2016-10-18 DIAGNOSIS — I130 Hypertensive heart and chronic kidney disease with heart failure and stage 1 through stage 4 chronic kidney disease, or unspecified chronic kidney disease: Secondary | ICD-10-CM | POA: Diagnosis not present

## 2016-10-18 NOTE — Telephone Encounter (Signed)
Shannon [name], nurse from [facility] called to state that pt was supposed to have INR drawn tomorrow with [facility], but previous nurse never put in order. She is requesting that patient be scheduled in office as they discharged her today. Pt schedule for tomorrow at 9:30 in office.

## 2016-10-18 NOTE — Progress Notes (Signed)
PA received message from staff that patient's daughter, Soledad Gerlach, called with questions related to feeding tube.  PA called number back, however, no answer and no voicemail to leave a message.  Will continue attempts to reach daughter.  Brynda Greathouse, MMS RDN PA-C 11:43 AM

## 2016-10-19 ENCOUNTER — Ambulatory Visit (INDEPENDENT_AMBULATORY_CARE_PROVIDER_SITE_OTHER): Payer: Medicare Other | Admitting: Pharmacist

## 2016-10-19 ENCOUNTER — Ambulatory Visit (HOSPITAL_COMMUNITY)
Admission: RE | Admit: 2016-10-19 | Discharge: 2016-10-19 | Disposition: A | Discharge status: Home / Self Care | Payer: Medicare Other | Source: Ambulatory Visit | Attending: Interventional Radiology | Admitting: Interventional Radiology

## 2016-10-19 ENCOUNTER — Other Ambulatory Visit (HOSPITAL_COMMUNITY): Payer: Self-pay | Admitting: Interventional Radiology

## 2016-10-19 ENCOUNTER — Encounter (HOSPITAL_COMMUNITY): Payer: Self-pay | Admitting: *Deleted

## 2016-10-19 DIAGNOSIS — R633 Feeding difficulties, unspecified: Secondary | ICD-10-CM

## 2016-10-19 DIAGNOSIS — I4892 Unspecified atrial flutter: Secondary | ICD-10-CM

## 2016-10-19 DIAGNOSIS — I70262 Atherosclerosis of native arteries of extremities with gangrene, left leg: Secondary | ICD-10-CM

## 2016-10-19 DIAGNOSIS — Z5181 Encounter for therapeutic drug level monitoring: Secondary | ICD-10-CM

## 2016-10-19 HISTORY — PX: IR PATIENT EVAL TECH 0-60 MINS: IMG5564

## 2016-10-19 LAB — POCT INR: INR: 2

## 2016-10-19 NOTE — Procedures (Signed)
Patient and her daughter came in today with concerns that the night time feeding tube adapter was no longer staying secure in the J tube.  The catheter has been working well otherwise.   We tried to see if we had access to a different catheter that had a different hub that may work.  I added a lopez valve to solve her immediate issue but reviewed the options with Dr Kathlene Cote.  The [place] tube she used to have had a different hub but got easily clogged.  She has gastric outlet syndrome so she requires a j tube so the other available G tubes are not an option.  We can exchange it for a new catheter in hopes that it will be more "tacky" but she would eventually start having the same issues .  The patient has agreed to try the lopez valve and she it it solves the problem.  She will contact us if she wants [place] to exchange it

## 2016-10-20 ENCOUNTER — Telehealth: Payer: Self-pay | Admitting: Pharmacist

## 2016-10-20 ENCOUNTER — Other Ambulatory Visit: Payer: Self-pay | Admitting: Internal Medicine

## 2016-10-20 NOTE — Telephone Encounter (Signed)
Pts daughter called and reported that patient took 2.5 tablets yesterday of her warfarin. Given that she is on a prednisone taper will only take 0.5 tablets today then go back to usual dose. Advised with any bleeding to be evaluated. She states understanding and appreciation.

## 2016-10-21 IMAGING — CR DG ABD PORTABLE 1V
1 series · 1 of 1 positions shown · non-contrast
Comparison: None.

CLINICAL DATA: NG tube placement.

EXAM:
PORTABLE ABDOMEN - 1 VIEW

[AP]
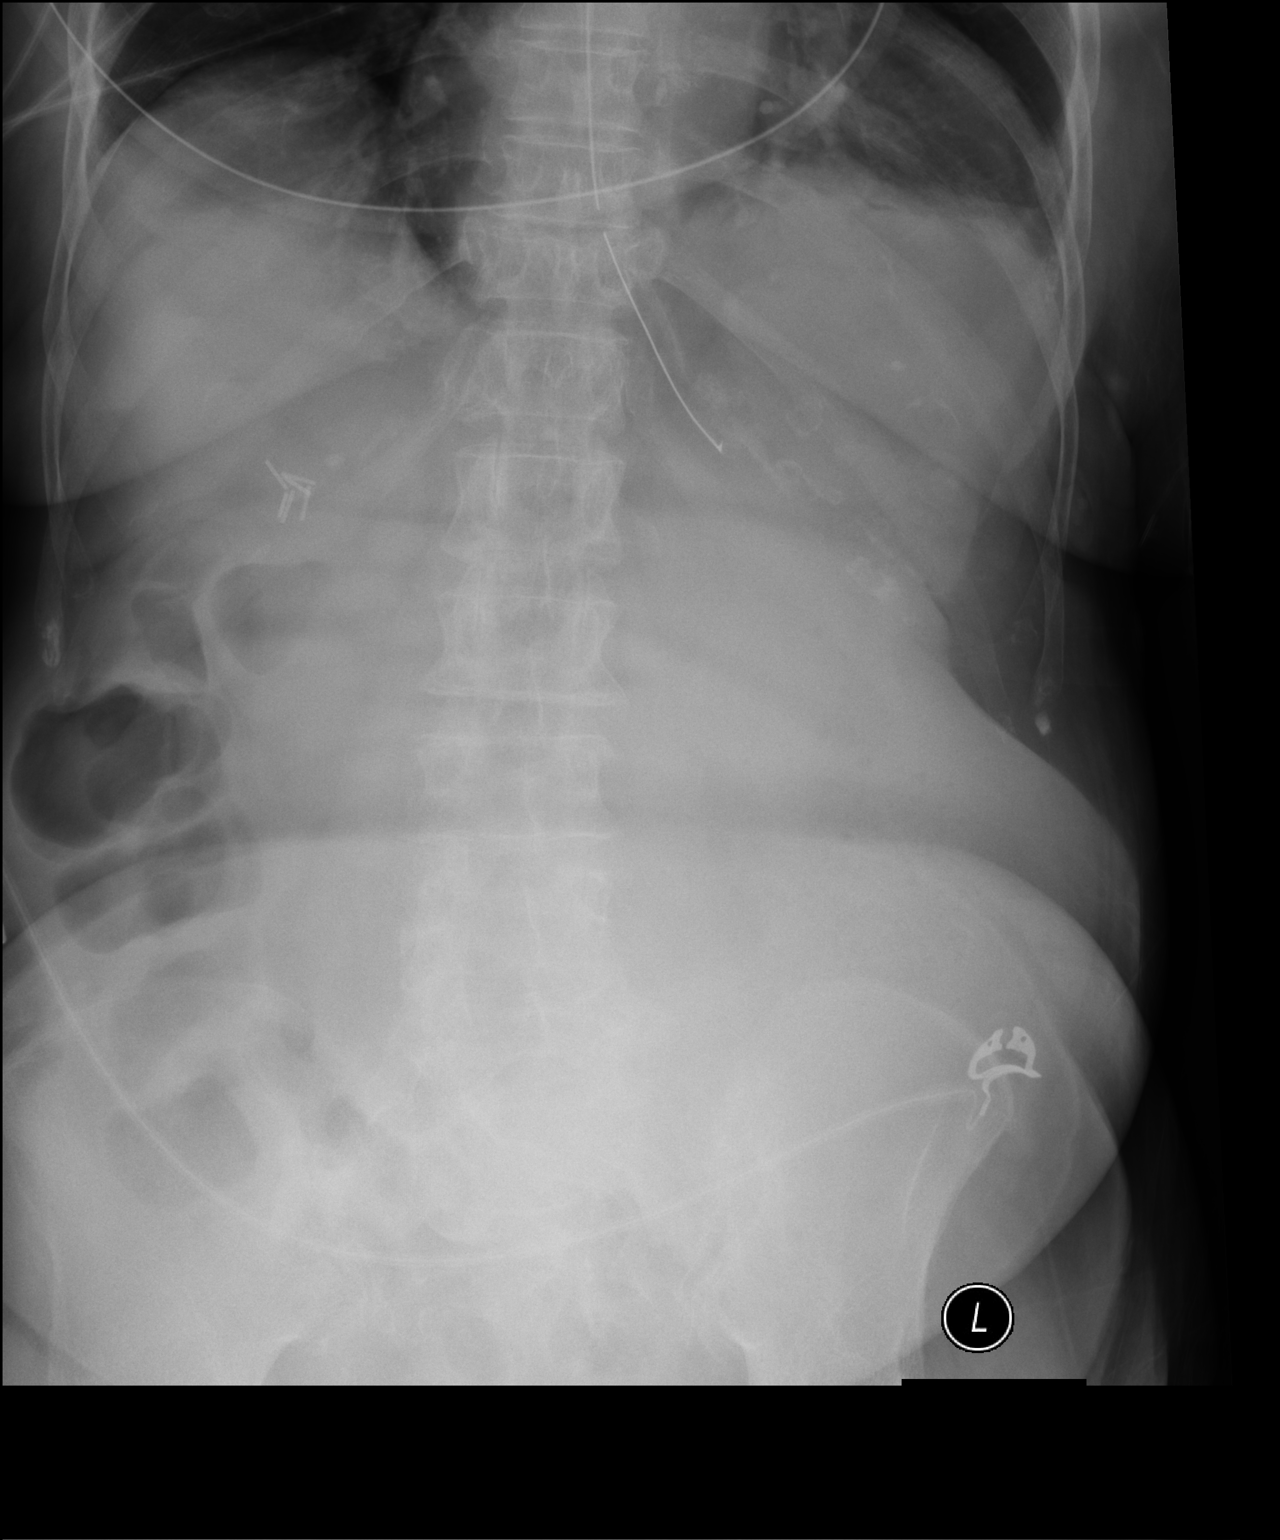

[1 of 1 positions shown; findings below may reference images not displayed]

FINDINGS: NG tube is in place the side port above the gastroesophageal
junction. Recommend advancement 7 cm.
IMPRESSION: As above.

## 2016-10-21 IMAGING — CT CT ABD-PELV W/O CM
2 of 4 series · 13 of 46 positions shown, 15 images · non-contrast
Comparison: 03/12/2014

CLINICAL DATA: Nausea and vomiting for 3 days, abdominal pain and
distension, history of multiple hernias, volvulus, cholecystectomy,
diverticulitis, stroke, seizure, hyperlipidemia, small bowel
obstruction, ventral hernia, chronic kidney disease

EXAM:
CT ABDOMEN AND PELVIS WITHOUT CONTRAST
TECHNIQUE: Multidetector CT imaging of the abdomen and pelvis was performed
following the standard protocol without IV contrast. Sagittal and
coronal MPR images reconstructed from axial data set.

[Series 201: routine, idose (2) · axial · 0.78mm/px · z∈[-339,-4]mm · 10 of 83 slices shown, 12 images]
[im 8/83  soft-tissue]
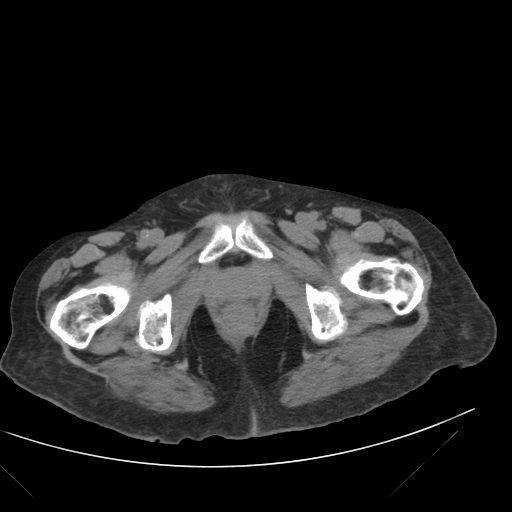
[im 8/83  bone]
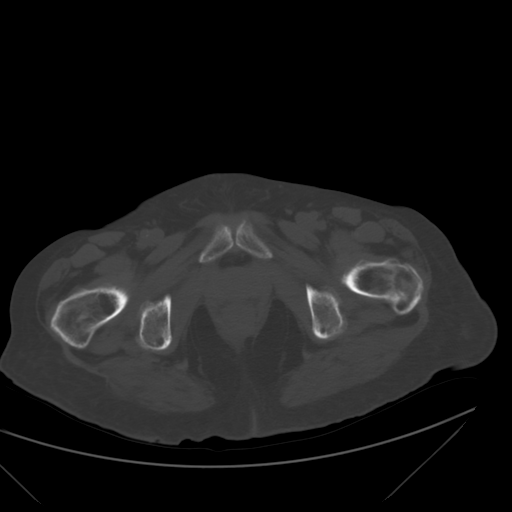
[im 15/83  soft-tissue]
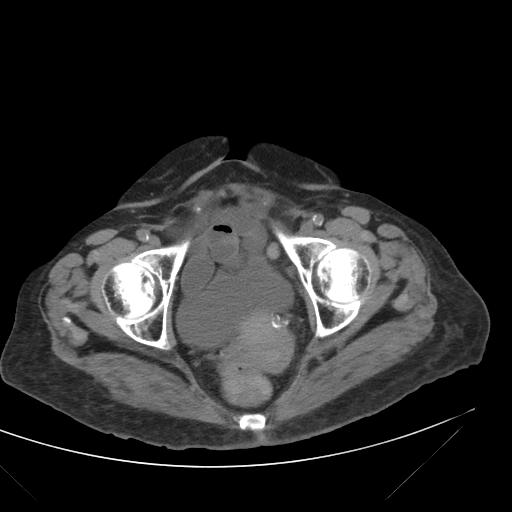
[im 22/83  soft-tissue]
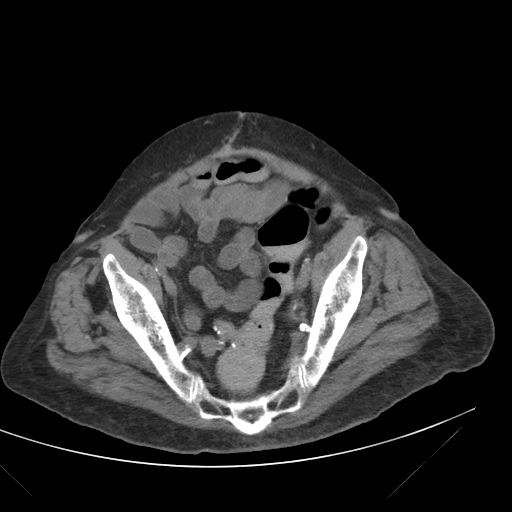
[im 29/83  soft-tissue]
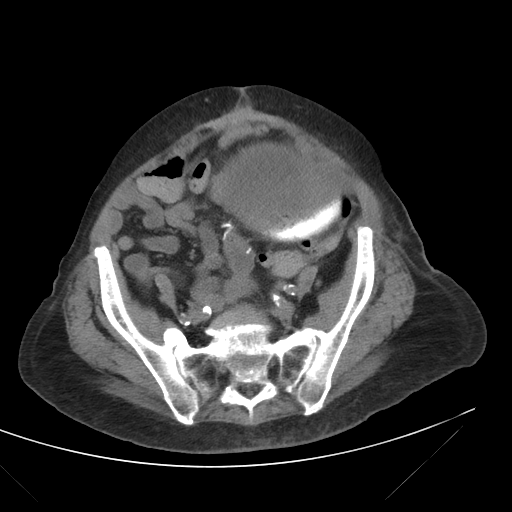
[im 36/83  soft-tissue]
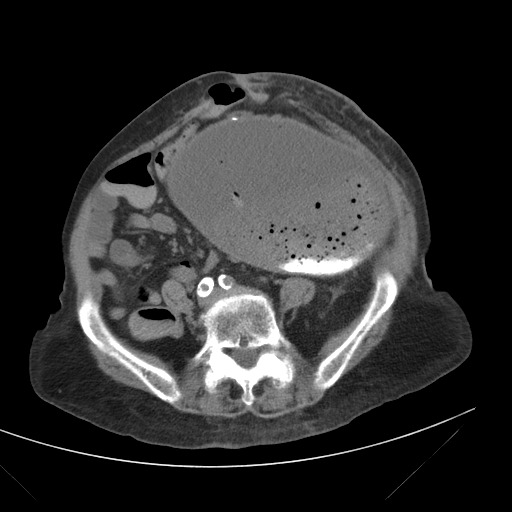
[im 47/83  soft-tissue]
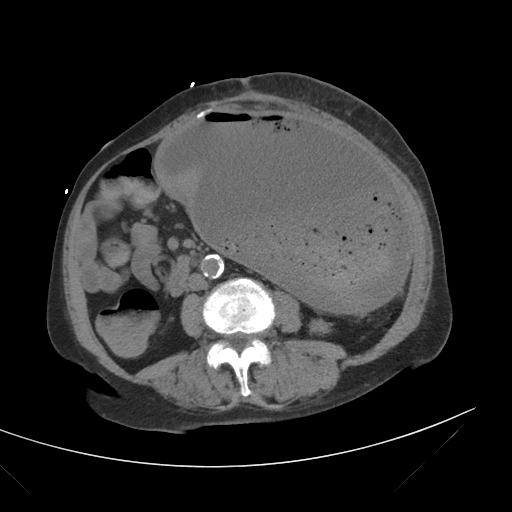
[im 54/83  soft-tissue]
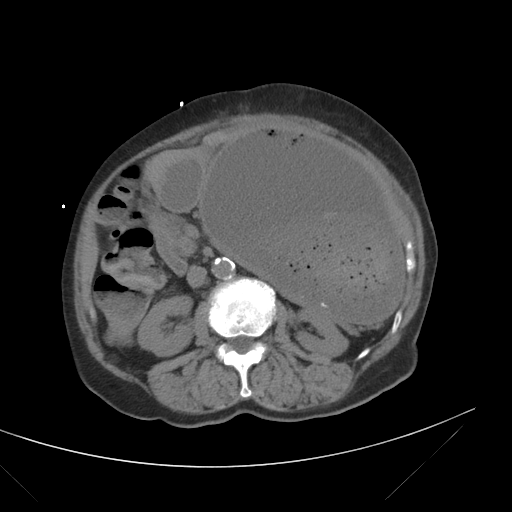
[im 61/83  soft-tissue]
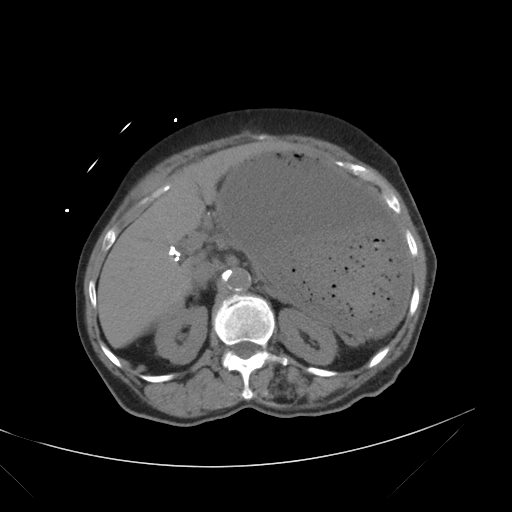
[im 68/83  soft-tissue]
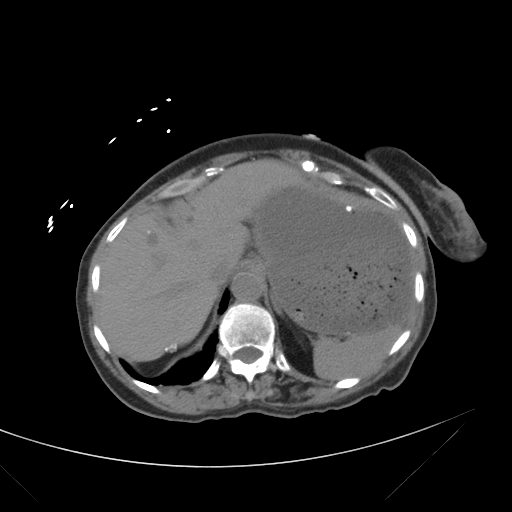
[im 68/83  bone]
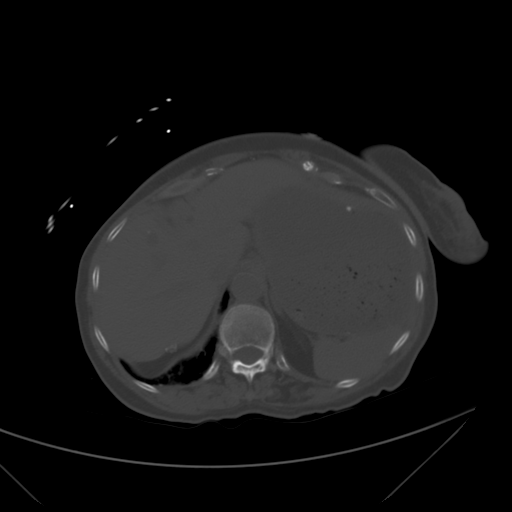
[im 75/83  soft-tissue]
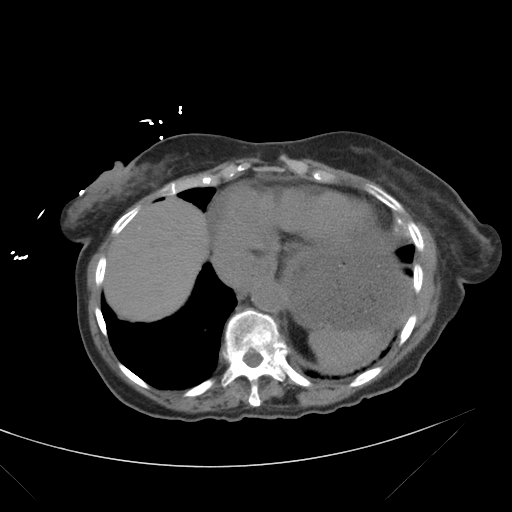

[Series 202: coronals, idose (2) · coronal · 0.50mm/px · 3 of 129 slices shown]
[im 43/129  soft-tissue]
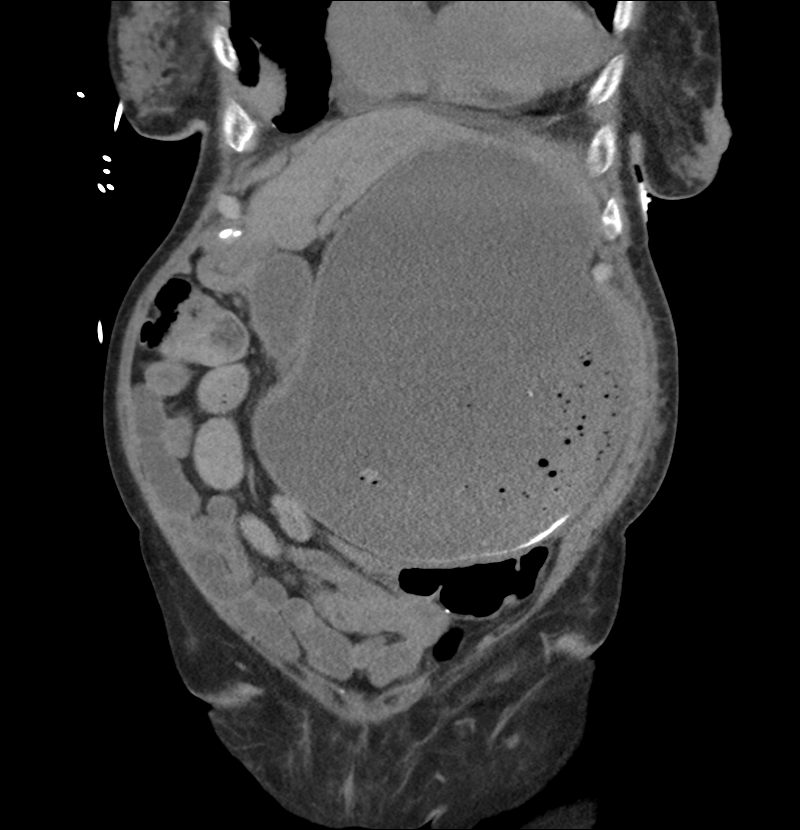
[im 57/129  soft-tissue]
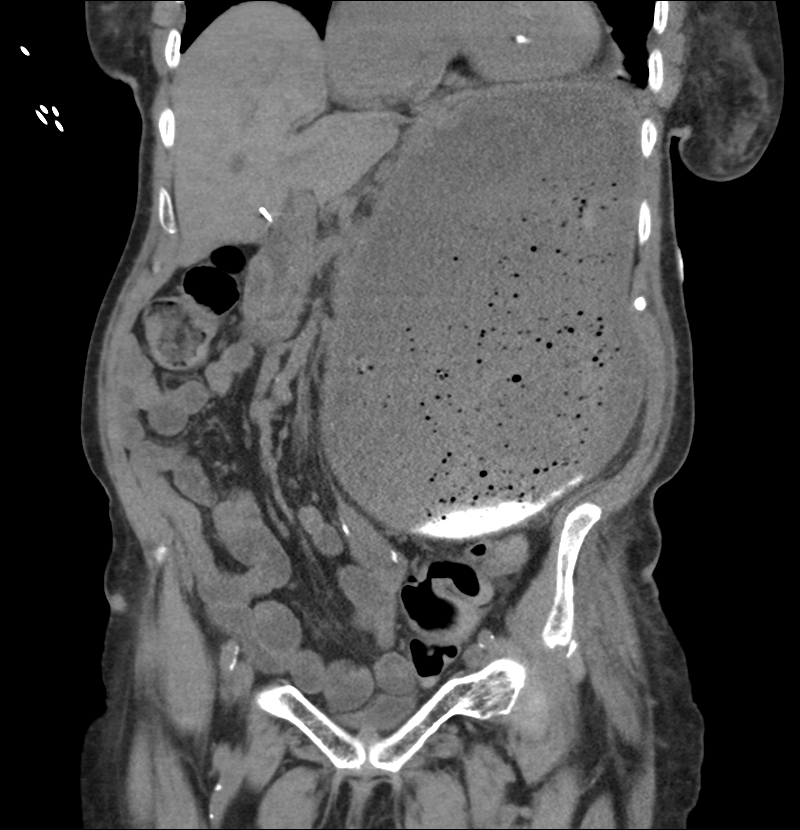
[im 72/129  soft-tissue]
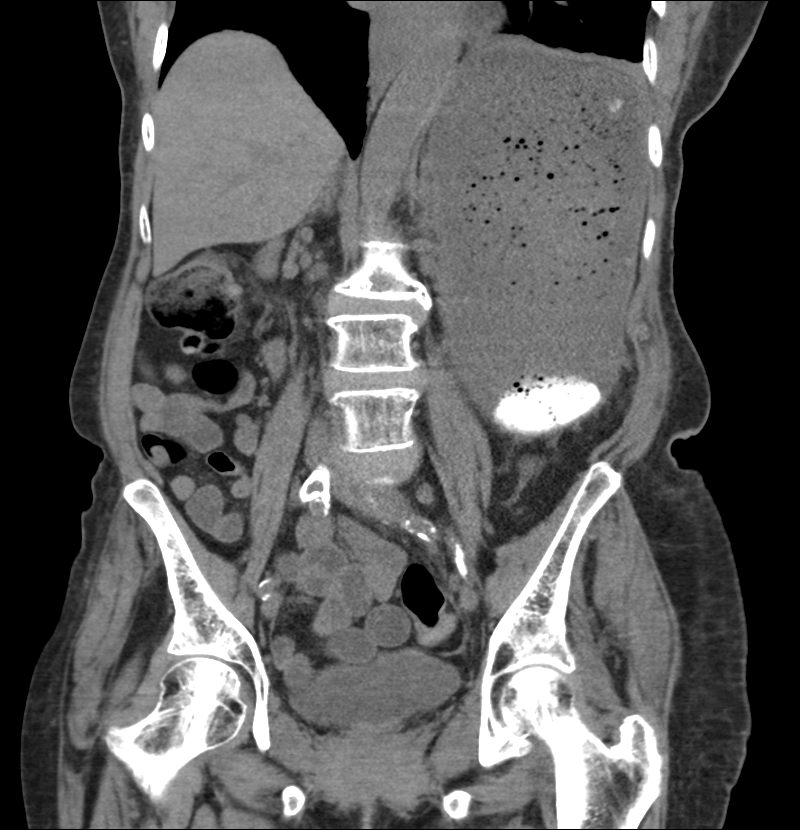

[13 of 46 positions shown; findings below may reference images not displayed]

FINDINGS: Minimal atelectasis at lung bases.

Small pericardial effusion.

Gallbladder surgically absent.

Multiple low-attenuation foci within liver similar to prior exams,
some which are present small cyst, additional of which are
indeterminate.

No new abnormalities of the liver, spleen, atrophic pancreas,
kidneys, or adrenal glands.

Marked gastric distention containing fluid and food debris.

Significant pyloric thickening again identified consistent with
gastric outlet obstruction.

This could be due to peptic ulcer disease with associated mural
edema or mass/tumor.

RIGHT parasagittal ventral hernia containing a nonobstructed bowel
loop.

Large and small bowel loops otherwise unremarkable.

Extensive atherosclerotic disease.

No definite evidence of bowel obstruction or perforation.

Unremarkable bladder, uterus and adnexae.

Bones demineralized with chronic superior endplate compression
deformity of T11 unchanged.
IMPRESSION: Marked gastric distention with stomach filled with fluid and food
debris and associated with thickening of the pyloric wall consistent
with gastric outlet obstruction, question due to peptic ulcer
disease with edema or mass/tumor.

Stable low to intermediate attenuation lesions within the liver.

Extensive atherosclerotic disease.

## 2016-10-22 NOTE — Progress Notes (Signed)
   Subjective:  Patient with a history of diabetes mellitus presents today for evaluation ulceration(s) to the lower extremitie(s). Patient is currently being managed by Dr. Tawni Millers, vascular surgeon for wound management of the toe amputation stump ulcer. Patient is also requesting that we debride her toenails. Patient is unable to do so. Patient presents today for further treatment and evaluation   Objective/Physical Exam General: The patient is alert and oriented x3 in no acute distress.  Dermatology:  Wound #1 noted to the left toe amputation stump measuring approximately [phone] cm (LxWxD).   To the noted ulceration(s), there is no eschar. There is a moderate amount of slough, fibrin, and necrotic tissue noted. Granulation tissue and wound base is red. There is a minimal amount of serosanguineous drainage noted. There is no exposed bone muscle-tendon ligament or joint. There is no malodor. Periwound integrity is intact. Skin is warm, dry and supple bilateral lower extremities. Toenails are also thickened, elongated and discolored.   Vascular: Palpable pedal pulses bilaterally. No edema or erythema noted. Capillary refill within normal limits.  Neurological: Epicritic and protective threshold absent bilaterally.   Musculoskeletal Exam: Range of motion within normal limits to all pedal and ankle joints bilateral. Muscle strength 5/5 in all groups bilateral.   Assessment: #1 ulcer left toe amputation stump  secondary to diabetes mellitus #2 diabetes mellitus w/ peripheral neuropathy #3 painful onychomycosis bilateral feet   Plan of Care:  #1 Patient was evaluated. #2 medically necessary excisional debridement including subcutaneous tissue was performed using a tissue nipper and a chisel blade. Excisional debridement of all the necrotic nonviable tissue down to healthy bleeding viable tissue was performed with post-debridement measurements same as pre-. #3 the wound was cleansed and  dry sterile dressing applied. #4  mechanical debridement of nails 1-5 bilaterally was performed with exception of the amputation stump using a nail nipper without incident or bleeding.  #5 continue wound management by Dr. Tawni Millers, vascular surgeon  #6 patient is to return to clinic in  3 months for routine nail care  Edrick Kins, DPM [facility]  Dr. Edrick Kins, [place]                                        [place],  [address]                Office [phone]  Fax [phone]

## 2016-10-23 ENCOUNTER — Other Ambulatory Visit: Payer: Self-pay | Admitting: Internal Medicine

## 2016-10-23 IMAGING — CR DG ABDOMEN 1V
1 series · 1 of 1 positions shown · non-contrast
Comparison: Plain film 05/21/2014, CT 05/21/2014

CLINICAL DATA: 89-year-old female with a history of small bowel
obstruction. Not currently having pain.

EXAM:
ABDOMEN - 1 VIEW

[abdomen supine]
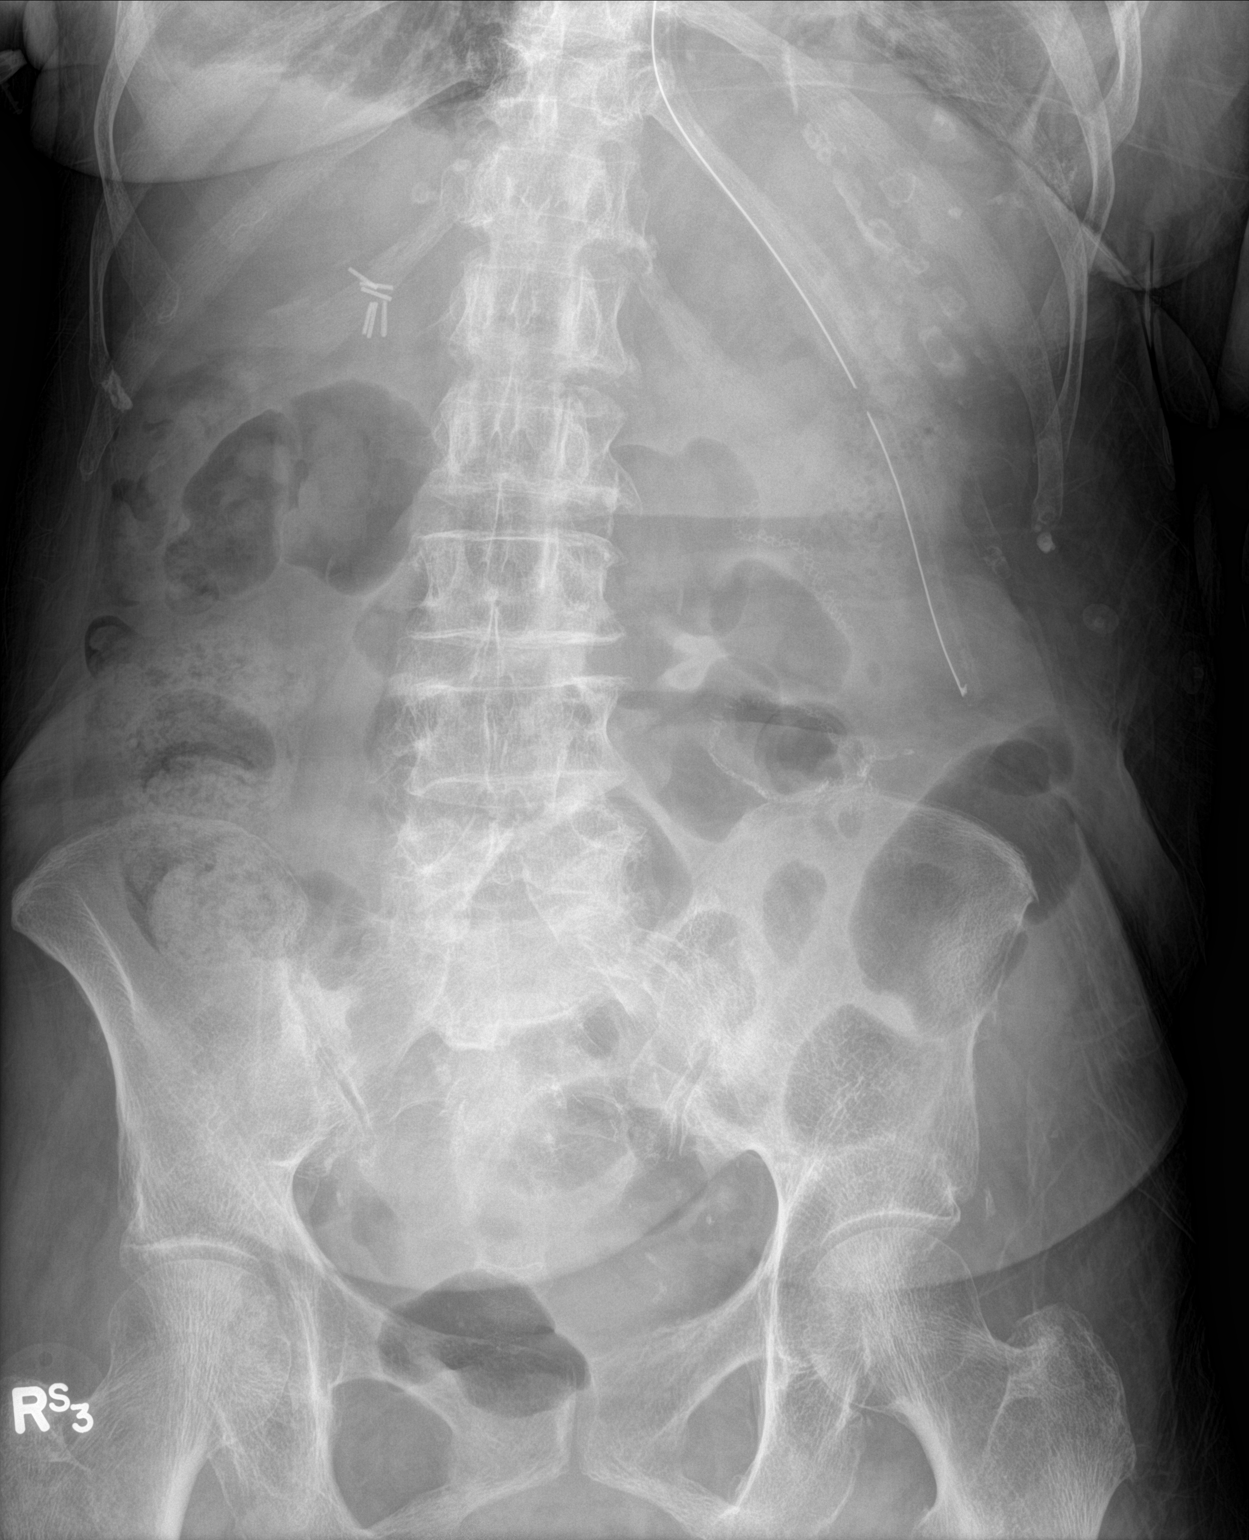

[1 of 1 positions shown; findings below may reference images not displayed]

FINDINGS: Interval advancement of the gastric tube, which terminates in the
left abdomen.

No abnormally distended small bowel or colon. Gas extends to the
rectum/sigmoid colon. No unexpected calcifications or radiopaque
foreign body.

Vascular calcifications.

Surgical staple line within the left abdomen.

Surgical clips at the inferior liver margin.
IMPRESSION: Interval advancement of the gastric tube, which now terminates in
the left abdomen.

Decreased volume of the soft tissue in the mid abdomen, likely after
decompression of the stomach which was distended on the most recent
CT.

Gas extends through the colon to the rectum/sigmoid colon with no
evidence of small bowel or colonic obstruction. Note that the recent
CT demonstrated a more proximal gastric outlet obstruction.

Surgical clips at the inferior liver margin and staple line in the
left abdomen.

## 2016-10-24 ENCOUNTER — Ambulatory Visit (INDEPENDENT_AMBULATORY_CARE_PROVIDER_SITE_OTHER): Payer: Self-pay | Admitting: Vascular Surgery

## 2016-10-24 ENCOUNTER — Encounter: Payer: Self-pay | Admitting: Vascular Surgery

## 2016-10-24 ENCOUNTER — Other Ambulatory Visit: Payer: Self-pay

## 2016-10-24 VITALS — BP 196/74 | HR 62 | Temp 97.5°F | Resp 20 | Ht 66.0 in | Wt 145.7 lb

## 2016-10-24 DIAGNOSIS — I70262 Atherosclerosis of native arteries of extremities with gangrene, left leg: Secondary | ICD-10-CM

## 2016-10-24 NOTE — Progress Notes (Signed)
Patient name: Shannon [name] MRN: [id number] DOB: 06-24-1923 Sex: female  REASON FOR VISIT: Follow-up SFA angioplasty with Dr. Randie Heinz followed by left second and third toe open amputation by myself on 09/25/2016. She is here today with her daughter. She reports minimal difficulty with discomfort associated with this. There've been extremely compliant with twice a day normal saline wet-to-dry dressing and she has been very compliant with wearing her Darco shoe.    Current Outpatient Prescriptions  Medication Sig Dispense Refill  . acetaminophen (TYLENOL) 500 MG tablet Take 1,000 mg by mouth every 6 (six) hours as needed (pain).    . AMINO ACIDS-PROTEIN HYDROLYS PO Take 30 mLs by mouth 2 (two) times daily.    [name] atorvastatin (LIPITOR) 10 MG tablet TAKE ONE TABLET BY MOUTH ONCE DAILY 90 tablet 1  . b complex vitamins tablet Take 1 tablet by mouth daily.     . benzonatate (TESSALON PERLES) 100 MG capsule Take two capsules by mouth three times daily as needed for cough 80 capsule 1  . calcium-vitamin D (OSCAL WITH D) 500-200 MG-UNIT per tablet Take 1 tablet by mouth daily with breakfast.     . carvedilol (COREG) 6.25 MG tablet Take 1 tablet (6.25 mg total) by mouth 2 (two) times daily. 60 tablet 6  . chlorhexidine (PERIDEX) 0.12 % solution Use as directed 15 mLs in the mouth or throat 2 (two) times daily.    . clopidogrel (PLAVIX) 75 MG tablet TAKE 1 TABLET (75 MG TOTAL) BY MOUTH DAILY WITH BREAKFAST. 90 tablet 1  . feeding supplement (OSMOLITE 1 CAL) LIQD Take 237 mLs by mouth at bedtime.    . fluticasone (FLONASE) 50 MCG/ACT nasal spray Place 2 sprays into both nostrils daily. 16 g 3  . gabapentin (NEURONTIN) 300 MG capsule Take 600 mg by mouth 3 (three) times daily.    . hydrALAZINE (APRESOLINE) 25 MG tablet Take 1 tablet (25 mg total) by mouth 2 (two) times daily. 60 tablet 6  . insulin aspart (NOVOLOG FLEXPEN) 100 UNIT/ML FlexPen Before each meal 3 times a day,  140-199 - 2 units, 200-250 - 4 units, 251-299 - 6 units,  300-349 - 8 units,  350 or above 10 units. Insulin PEN if approved, provide syringes and needles if needed. 15 mL 0  . insulin glargine (LANTUS) 100 UNIT/ML injection Inject 0.1 mLs (10 Units total) into the skin daily. 10 mL 11  . ipratropium-albuterol (DUONEB) 0.5-2.5 (3) MG/3ML SOLN Take 3 mLs by nebulization every 6 (six) hours as needed (sob). Use 3 times daily x 4 days then every 6 hours as needed. 360 mL 4  . isosorbide mononitrate (IMDUR) 30 MG 24 hr tablet Take 30 mg by mouth daily.    [name] latanoprost (XALATAN) 0.005 % ophthalmic solution Place 1 drop into both eyes at bedtime. 2.5 mL 12  . levalbuterol (XOPENEX HFA) 45 MCG/ACT inhaler Inhale 2 puffs into the lungs every 6 (six) hours as needed for wheezing or shortness of breath. 1 Inhaler 12  . levothyroxine (SYNTHROID, LEVOTHROID) 137 MCG tablet TAKE 1 TABLET (137 MCG TOTAL) BY MOUTH DAILY BEFORE BREAKFAST. 30 tablet 2  . Maltodextrin-Xanthan Gum (RESOURCE THICKENUP CLEAR) POWD Take 120 g by mouth as needed (use to thicken liquids.). 20 Can 0  . mometasone-formoterol (DULERA) 200-5 MCG/ACT AERO Inhale 2 puffs into the lungs 2 (two) times daily. 13 g 6  . Multiple Vitamin (MULTIVITAMIN WITH MINERALS) TABS tablet Take 1 tablet by mouth daily.    [name] nystatin (  MYCOSTATIN/NYSTOP) powder Apply topically 3 (three) times daily. 15 g 0  . Polyethyl Glycol-Propyl Glycol 0.4-0.3 % SOLN Place 1 drop into both eyes 4 (four) times daily.     . polyethylene glycol (MIRALAX / GLYCOLAX) packet Take 17 g by mouth daily as needed for mild constipation.    . predniSONE (STERAPRED UNI-PAK 21 TAB) 10 MG (21) TBPK tablet Use as directed 21 tablet 0  . RESTASIS 0.05 % ophthalmic emulsion USE 1 DROP INTO BOTH EYES TWICE DAILY 60 mL 3  . sertraline (ZOLOFT) 100 MG tablet TAKE ONE TABLET BY MOUTH ONCE DAILY FOR DEPRESSION 90 tablet 1  . warfarin (COUMADIN) 2.5 MG tablet TAKE AS DIRECTED BY COUMADIN CLINIC. 50  tablet 1  . Water For Irrigation, Sterile (FREE WATER) SOLN Place 230 mLs into feeding tube 3 (three) times daily.     No current facility-administered medications for this visit.      PHYSICAL EXAM: Vitals:   10/24/16 1039 10/24/16 1043  BP: (!) 190/77 (!) 196/74  Pulse: 62   Resp: 20   Temp: (!) 97.5 F (36.4 C)   TempSrc: Oral   SpO2: 97%   Weight: 145 lb 11.2 oz (66.1 kg)   Height: 5\' 6"  (1.676 m)     GENERAL: The patient is a well-nourished female, in no acute distress. The vital signs are documented above. Excellent granulation tissue in the open area with nice contraction with no evidence of purulence or fluctuance.  MEDICAL ISSUES: Very nice Dorthy Hustead result from open education. Have recommended that she drop back to daily normal saline dressing changes. Also explained that she does not need to wear the Darco lift shoe anymore and is okay to bathe or shower foot normally. We will see her again in 6 weeks for continued follow-up   Larina Earthly, MD [facility] Tel [phone] Pager [phone]

## 2016-10-24 NOTE — Patient Outreach (Signed)
[facility]) Care Management  10/24/2016  Omelia Marquart 10/14/1923 [id number]     Telephone Screen  Referral Date: 10/18/16 Referral Source: [facility]  Referral Reason: HF, COPD, DM, HTN, amputation" Insurance: Traditional Medicare   Outreach attempt #1 to patient. No answer. RN CM left HIPAA compliant message along with contact info.        Plan: RN CM will make outreach attempt to patient within three business days if no return call.    Enzo Montgomery, RN,BSN,CCM [place] Management Telephonic Care Management Coordinator Direct Phone: [phone] Toll Free: [phone] Fax: [phone]

## 2016-10-25 ENCOUNTER — Telehealth: Payer: Self-pay | Admitting: *Deleted

## 2016-10-25 ENCOUNTER — Other Ambulatory Visit: Payer: Self-pay

## 2016-10-25 NOTE — Telephone Encounter (Signed)
Increase hydralazine to TID

## 2016-10-25 NOTE — Telephone Encounter (Signed)
Spoke with [name] and she stated that patient takes her Blood pressure before taking her medication in the morning and takes her BP after taking her medication in the evening.

## 2016-10-25 NOTE — Telephone Encounter (Signed)
LM with [name]'s husband for [name] to return call.

## 2016-10-25 NOTE — Telephone Encounter (Signed)
Are theses readings before or AFTER her BP meds are given?

## 2016-10-25 NOTE — Telephone Encounter (Signed)
Patient Caregiver, Soledad Gerlach called and stated that patient's BP is still running high. She is taking her medications as directed and the Hydralazine 25mg  twice daily. Higher in the morning. Stated yesterday her head and chest hurting. Patient was taking Prednisone but has completed about a week ago. Please Advise.   10/25/16- 196/80  10/24/16-  230/91 137/64pm 10/23/16-  227/89 162/79pm 10/22/16-  179/99 151/78pm 10/21/16-  217/90 188/80pm 10/20/16-  209/92  206/94pm

## 2016-10-25 NOTE — Patient Outreach (Addendum)
Shannon [name]) Care Management  10/25/2016  Shannon [name] 08/03/1923 [id number]   Telephone Screen  Referral Date: 10/18/16 Referral Source: [facility]  Referral Reason: HF, COPD, DM, HTN, amputation" Insurance: Traditional Medicare   Voicemail message received from dtr-Shannon [name]. Return call placed to dtr. Screening call completed with dtr.   Social: Patient lives with her dtr and her family. Dtr states that patient is independent with ADLs and requires some assistance with IADLs. Dtr transports patient to medical appts. Dtr reports no recent falls within the past year. DME in the home include oxygen, cbg meter, BP machine,shower chair, walker, cane and wheelchair. Patient was discharged form [name] services on 10/18/16.   Conditions: Patient has extensive PMH of stroke, SBO with ventral hernia, GERD, SIADH, seizures, CKD stage 3, HTN, DM, HF, A-fib, HLD, COPD, LLE DVT glaucoma, arthritis and depression. She was recently hospitalized form 09/14/16 to 09/27/16. She underwent left 2nd and 3rd toe amputation after developing gangrene to affected area. Dtr is providing dressing changes to patient daily. Patient also has feeding tube and gets continuous nocturnal feedings. Dtr voices that they have ongoing issues with feeding tube malfunctioning and recently saw IR to have tube fixed. Patient using oxygen primarily at night to help her rest better per dtr. Blood sugars are being checked 4x/day and ranging in the 100s-200s. Dtr states that cbgs were recently running high while patient was taking steroids but have since started to improve. Last A1c was 7.7(May 2018). She voices that patient having issues with BP. PCP is aware and has recently made med adjustments. However, dtr states BP still running high in the mornings. She states SBP was 230 yesterday am and was 190 this am. Patient does complain of slight headaches during this time. Dtr also voices that patient complains of "chest hurting."  Dtr will be contacting MD office today to alert them of these issues.  Dtr voices that she feels "anxious/nervous" and needs clinical/medical support and education in managing patient's multiple co-morbidities. She reports that patient was set up with palliative care through [place] during last hospitalization. She voices that they made one initial home visit but she has not heard anything from them since. Dtr states that patient "doesn't really want them." Of note, patient has had six hospital admissions this year so far.   Medications: Dtr is managing patient's meds. She voices patient takes 21 meds. She reports that they do have trouble affording her insulins at times and would like possible assistance.   Appointments: Patient followed by multiple MDs. Dr. Marylen Ponto appt 11/08/16 Dr. Nelson-cardiologist Dr. Patel-pulmonologist Dr. Lurline Hare- saw on 10/11/16 Dr. Virgia Land surgeon saw on 10/24/16 [facility] appt 10/26/16  Advance Directives: Patient has living will and [place]. She has designated her dtr and son as her surrogate Garment/textile technologist.    Consent: [name] services reviewed and discussed at length with dtr. Dtr gave verbal consent for [name] services.  Plan: RN CM will notify [name] administrative assistant of case status. RN CM will send referral to [name] RN for further in home eval/assessment of care needs and management of chronic conditions. RN CM will send [place] referral for polypharmacy med review and possible  RN CM provided patient with [facility] 24hr Nurse Line contact info.   Enzo Montgomery, RN,BSN,CCM [place] Management Telephonic Care Management Coordinator Direct Phone: [phone] Toll Free: [phone] Fax: [phone]

## 2016-10-26 ENCOUNTER — Ambulatory Visit (INDEPENDENT_AMBULATORY_CARE_PROVIDER_SITE_OTHER): Payer: Medicare Other | Admitting: *Deleted

## 2016-10-26 DIAGNOSIS — I70262 Atherosclerosis of native arteries of extremities with gangrene, left leg: Secondary | ICD-10-CM | POA: Diagnosis not present

## 2016-10-26 DIAGNOSIS — Z5181 Encounter for therapeutic drug level monitoring: Secondary | ICD-10-CM | POA: Diagnosis not present

## 2016-10-26 DIAGNOSIS — I4892 Unspecified atrial flutter: Secondary | ICD-10-CM

## 2016-10-26 LAB — POCT INR: INR: 3.3

## 2016-10-26 MED ORDER — HYDRALAZINE HCL 25 MG PO TABS: 25.0000 mg | ORAL_TABLET | Freq: Three times a day (TID) | ORAL | 6 refills | Status: DC

## 2016-10-26 NOTE — Telephone Encounter (Signed)
[name] notified and agreed. Medication list updated.

## 2016-10-26 NOTE — Telephone Encounter (Signed)
LMOM for Shannon [name] to Return call.

## 2016-10-27 ENCOUNTER — Ambulatory Visit: Payer: Medicare Other

## 2016-10-27 ENCOUNTER — Other Ambulatory Visit: Payer: Self-pay | Admitting: *Deleted

## 2016-10-27 MED ORDER — HYDRALAZINE HCL 25 MG PO TABS: 25.0000 mg | ORAL_TABLET | Freq: Three times a day (TID) | ORAL | 6 refills | Status: DC

## 2016-10-27 NOTE — Telephone Encounter (Signed)
Patient daughter, [name] requested to be sent to pharmacy.

## 2016-10-30 ENCOUNTER — Encounter: Payer: Self-pay | Admitting: *Deleted

## 2016-10-30 ENCOUNTER — Other Ambulatory Visit: Payer: Self-pay | Admitting: *Deleted

## 2016-10-30 NOTE — Patient Outreach (Signed)
[name]) Care Management [place] Coordination  10/30/2016  Shannon [name] 1923-06-25 [id number]  Successful telephone outreach to "Amber" of hospice/ Palliative care of [place] [phone]) re: Shannon [name], 83 y/o female referred to [place] by [place] home health [facility]) agency after their services were recently completed.  Patient had recent hospitalization June 14-27, 2018 for COPD exacerbation/  ARF, pneumonia with pleural effusion; during hospitalization, patient had (L) 2nd and 3rd toe amputations on 09/25/16 due to gangrene.  Patient has history including, but not limited to, DM Type II, HTN, HLD, CKD stg III, dCHF, AF on coumadin, prior CVA, COPD on home O2, and protein calorie malnutrition with PEG, on tube feeding.    Explained to [employer] that patient's daughter reported earlier today that patient is active with [place], stating that patient had a palliative care home visit in early June.  Amber confirmed that patient was admitted to Palliative Care program on September 07, 2016, but was unable to share details of Palliative care involvement in patient's current care.  Amber suggested that I call Palliative care number Merrily Pew, NP) directly for additional information.  I then called Palliative care extension ([phone]) and left detailed voicemail message for Josh, requesting callback to confirm their involvement in patient's care.  Plan:  Will collaborate with Palliative care of [facility] staff as indicated, once I hear back from [place], [place], [place], BSN, [place] Care Management  [phone]

## 2016-10-30 NOTE — Patient Outreach (Signed)
Shannon [name]) Care Management [place] Telephone Outreach  10/30/2016  Shannon [name] 1923-05-18 [id number]  Successful telephone outreach to Shannon [name], daughter/ caregiver for Shannon [name], 83 y/o female referred to [place] by [place] home health [facility]) agency after their services were recently completed.  Patient had recent hospitalization June 14-27, 2018 for COPD exacerbation/  ARF, pneumonia with pleural effusion; during hospitalization, patient had (L) 2nd and 3rd toe amputations on 09/25/16 due to gangrene.  Patient has history including, but not limited to, DM Type II, HTN, HLD, CKD stg III, dCHF, AF on coumadin, prior CVA, COPD on home O2, and protein calorie malnutrition with PEG, on tube feeding.  HIPAA/ identity verified through patient's daughter/ caregiver during phone call today.  Today, daughter reports that patient is "doing much better" than she expected her to after her last hospitalization.  Reports that patient no longer has active [place] services involved in her care, but does report that patient is active with [place], stating that patient had a palliative care home visit "in early June;"  Daughter reports that she is unsure if Palliative care services are still involved with patient.  Explained [name] CM services to patient's daughter and she agreed that if Palliative Care services are still active with patient, that patient would not need both services.  Explained to patient's daughter that I would contact [place] to confirm their involvement in patient's care and would re-contact her with follow up, to which she agreed with.  Patient's daughter further reports:  -- Patient using O2 at night through oxygenator; doesn't need to use much during day time hours -- Patient taking medications as prescribed; attending all scheduled provider appointments -- Patient continues with tube feedings "only at  night." -- caregiver/ daughter continues monitoring/ recording daily weights; reports weight today of "146" lbs and weight yesterday of "145 lbs."  States weights have been "holding steady" and verbalizes accurate understanding of rationale for weight gain guidelines/ along with action plan for weight gain. -- Patient has/ is taking medications as prescribed, under daughter's overall management.  Patient's caregiver/ daughter denies further issues, concerns, or problems today and verbalized that she would prefer ongoing conversation once Palliative Care services are clarified/ confirmed.  I provided patient's daughter with my direct phone number, the main [name] CM office phone number, and the [facility] CM 24-hour nurse advice phone number should issues arise prior to next scheduled [place] outreach.  Plan:  Patient will take medications as prescribed and attend all scheduled provider appointments  Patient will continue monitoring and recording daily weights  Patient will promptly notify medical providers for any new concerns/ problems  I will reach out to staff at [facility] to determine their involvement in patient's care, and will communicate same back to patient's daughter  Oneta Rack, RN, BSN, [employer] Coordinator [facility] Care Management  [phone]

## 2016-11-01 ENCOUNTER — Encounter: Payer: Self-pay | Admitting: *Deleted

## 2016-11-01 ENCOUNTER — Other Ambulatory Visit: Payer: Self-pay | Admitting: *Deleted

## 2016-11-01 ENCOUNTER — Other Ambulatory Visit: Payer: Self-pay

## 2016-11-01 ENCOUNTER — Telehealth: Payer: Self-pay

## 2016-11-01 MED ORDER — INSULIN GLARGINE 100 UNIT/ML SOLOSTAR PEN: PEN_INJECTOR | SUBCUTANEOUS | 5 refills | Status: DC

## 2016-11-01 NOTE — Telephone Encounter (Signed)
Faxed new Rx to [facility] for [employer]

## 2016-11-01 NOTE — Patient Outreach (Signed)
[facility]) Care Management [place] Coordination  11/01/2016  Shannon [name] May 05, 1923 [id number]  Successful telephone outreach to Shannon [name], Nurse Navigator for Hospice/ Palliative care of [place] [phone])  re: Shannon [name], 83 y/o female referred to [place] by [place] home health [facility]) agency after their services were recently completed. Patient had recent hospitalization June 14-27, 2054for COPD exacerbation/ ARF, pneumonia with pleural effusion; during hospitalization, patient had (L) 2nd and 3rd toe amputations on 09/25/16 due to gangrene. Patient has history including, but not limited to, DM Type II, HTN, HLD, CKD stg III, dCHF, AF on coumadin, prior CVA, COPD on home O2, and protein calorie malnutrition with PEG, on tube feeding.   Explained to Shannon [name] that patient's daughter reported earlier this week that patient is active with [place], stating that patient had a palliative care home visit from NP [place] in early June.  Shannon [name] confirmed that patient was admitted to Palliative Care program on September 07, 2016, and that [place] remains actively involved in her care.  Shannon [name] explained that Palliative Care would remain involved indefinitely in patient's care as long as necessary around patient's care needs.  Plan:  Will reach out to patient's caregiver/ daughter to clarify Palliative care services  Oneta Rack, RN, BSN, [place] Coordinator [facility] Care Management  [phone]

## 2016-11-01 NOTE — Telephone Encounter (Signed)
Received fax from [facility] -patient has requested new prescription. She takes Lantus vials are only good for 28 days in room temperature and patient is paying 80 dollars a month. If you send Pen Solostar she can keep one pen out of fridge for 28 days. Message to Dr. Eulas Post for new order.

## 2016-11-01 NOTE — Telephone Encounter (Signed)
Ok for [employer] with same directions

## 2016-11-02 ENCOUNTER — Encounter (HOSPITAL_COMMUNITY): Payer: Self-pay | Admitting: Emergency Medicine

## 2016-11-02 ENCOUNTER — Other Ambulatory Visit: Payer: Self-pay | Admitting: *Deleted

## 2016-11-02 ENCOUNTER — Emergency Department (HOSPITAL_COMMUNITY)
Admission: EM | Admit: 2016-11-02 | Discharge: 2016-11-02 | Disposition: A | Discharge status: Home / Self Care | Payer: Medicare Other | Attending: Emergency Medicine | Admitting: Emergency Medicine

## 2016-11-02 ENCOUNTER — Emergency Department (HOSPITAL_COMMUNITY): Payer: Medicare Other

## 2016-11-02 ENCOUNTER — Telehealth: Payer: Self-pay

## 2016-11-02 ENCOUNTER — Other Ambulatory Visit: Payer: Self-pay

## 2016-11-02 DIAGNOSIS — Z8673 Personal history of transient ischemic attack (TIA), and cerebral infarction without residual deficits: Secondary | ICD-10-CM | POA: Insufficient documentation

## 2016-11-02 DIAGNOSIS — I5032 Chronic diastolic (congestive) heart failure: Secondary | ICD-10-CM | POA: Insufficient documentation

## 2016-11-02 DIAGNOSIS — J45909 Unspecified asthma, uncomplicated: Secondary | ICD-10-CM | POA: Diagnosis not present

## 2016-11-02 DIAGNOSIS — I2510 Atherosclerotic heart disease of native coronary artery without angina pectoris: Secondary | ICD-10-CM | POA: Insufficient documentation

## 2016-11-02 DIAGNOSIS — E039 Hypothyroidism, unspecified: Secondary | ICD-10-CM | POA: Diagnosis not present

## 2016-11-02 DIAGNOSIS — N183 Chronic kidney disease, stage 3 (moderate): Secondary | ICD-10-CM | POA: Insufficient documentation

## 2016-11-02 DIAGNOSIS — E1122 Type 2 diabetes mellitus with diabetic chronic kidney disease: Secondary | ICD-10-CM | POA: Insufficient documentation

## 2016-11-02 DIAGNOSIS — I10 Essential (primary) hypertension: Secondary | ICD-10-CM | POA: Diagnosis not present

## 2016-11-02 DIAGNOSIS — Z79899 Other long term (current) drug therapy: Secondary | ICD-10-CM | POA: Diagnosis not present

## 2016-11-02 DIAGNOSIS — R51 Headache: Secondary | ICD-10-CM | POA: Diagnosis not present

## 2016-11-02 DIAGNOSIS — Z794 Long term (current) use of insulin: Secondary | ICD-10-CM | POA: Diagnosis not present

## 2016-11-02 DIAGNOSIS — Z7901 Long term (current) use of anticoagulants: Secondary | ICD-10-CM | POA: Diagnosis not present

## 2016-11-02 DIAGNOSIS — R519 Headache, unspecified: Secondary | ICD-10-CM

## 2016-11-02 DIAGNOSIS — Z7902 Long term (current) use of antithrombotics/antiplatelets: Secondary | ICD-10-CM | POA: Insufficient documentation

## 2016-11-02 DIAGNOSIS — I130 Hypertensive heart and chronic kidney disease with heart failure and stage 1 through stage 4 chronic kidney disease, or unspecified chronic kidney disease: Secondary | ICD-10-CM | POA: Diagnosis present

## 2016-11-02 DIAGNOSIS — J449 Chronic obstructive pulmonary disease, unspecified: Secondary | ICD-10-CM | POA: Insufficient documentation

## 2016-11-02 DIAGNOSIS — Z9049 Acquired absence of other specified parts of digestive tract: Secondary | ICD-10-CM | POA: Diagnosis not present

## 2016-11-02 DIAGNOSIS — Z87891 Personal history of nicotine dependence: Secondary | ICD-10-CM | POA: Diagnosis not present

## 2016-11-02 LAB — COMPREHENSIVE METABOLIC PANEL
ALT: 19 U/L (ref 14–54)
AST: 24 U/L (ref 15–41)
Albumin: 3.5 g/dL (ref 3.5–5.0)
Alkaline Phosphatase: 58 U/L (ref 38–126)
Anion gap: 6 (ref 5–15)
BUN: 32 mg/dL — ABNORMAL HIGH (ref 6–20)
CO2: 29 mmol/L (ref 22–32)
Calcium: 9.7 mg/dL (ref 8.9–10.3)
Chloride: 98 mmol/L — ABNORMAL LOW (ref 101–111)
Creatinine, Ser: 1.01 mg/dL — ABNORMAL HIGH (ref 0.44–1.00)
GFR calc Af Amer: 55 mL/min — ABNORMAL LOW (ref >60)
GFR calc non Af Amer: 47 mL/min — ABNORMAL LOW (ref >60)
Glucose, Bld: 110 mg/dL — ABNORMAL HIGH (ref 65–99)
Potassium: 4.4 mmol/L (ref 3.5–5.1)
Sodium: 133 mmol/L — ABNORMAL LOW (ref 135–145)
Total Bilirubin: 0.5 mg/dL (ref 0.3–1.2)
Total Protein: 7 g/dL (ref 6.5–8.1)

## 2016-11-02 LAB — CBC
HCT: 32.8 % — ABNORMAL LOW (ref 36.0–46.0)
Hemoglobin: 11.1 g/dL — ABNORMAL LOW (ref 12.0–15.0)
MCH: 28.4 pg (ref 26.0–34.0)
MCHC: 33.8 g/dL (ref 30.0–36.0)
MCV: 83.9 fL (ref 78.0–100.0)
Platelets: 231 10*3/uL (ref 150–400)
RBC: 3.91 MIL/uL (ref 3.87–5.11)
RDW: 18 % — ABNORMAL HIGH (ref 11.5–15.5)
WBC: 8 10*3/uL (ref 4.0–10.5)

## 2016-11-02 LAB — URINALYSIS, ROUTINE W REFLEX MICROSCOPIC
Bacteria, UA: NONE SEEN
Bilirubin Urine: NEGATIVE
Glucose, UA: NEGATIVE mg/dL
Hgb urine dipstick: NEGATIVE
Ketones, ur: NEGATIVE mg/dL
Leukocytes, UA: NEGATIVE
Nitrite: NEGATIVE
Protein, ur: 100 mg/dL — AB
Specific Gravity, Urine: 1.006 (ref 1.005–1.030)
Squamous Epithelial / LPF: NONE SEEN
pH: 8 (ref 5.0–8.0)

## 2016-11-02 LAB — TROPONIN I: Troponin I: <0.03 ng/mL (ref <0.03)

## 2016-11-02 MED ORDER — ONDANSETRON HCL 4 MG/2ML IJ SOLN
4.0000 mg | Freq: Once | INTRAMUSCULAR | Status: AC
  Administered 2016-11-02: 4 mg via INTRAVENOUS
  Filled 2016-11-02: qty 2

## 2016-11-02 MED ORDER — METOPROLOL TARTRATE 25 MG PO TABS: 50.0000 mg | ORAL_TABLET | Freq: Two times a day (BID) | ORAL | 0 refills | Status: DC

## 2016-11-02 MED ORDER — MORPHINE SULFATE (PF) 4 MG/ML IV SOLN
2.0000 mg | Freq: Once | INTRAVENOUS | Status: AC
  Administered 2016-11-02: 2 mg via INTRAVENOUS
  Filled 2016-11-02: qty 1

## 2016-11-02 MED ORDER — LABETALOL HCL 5 MG/ML IV SOLN
20.0000 mg | Freq: Once | INTRAVENOUS | Status: AC
  Administered 2016-11-02: 20 mg via INTRAVENOUS
  Filled 2016-11-02: qty 4

## 2016-11-02 NOTE — Patient Outreach (Signed)
[facility]) [place] Coordination  11/02/2016  Tenelle Andreason 13-Aug-1923 [id number]  Received secure voicemail message from Billey Chang, NP [place] [phone]) regarding Srishti Strnad, 83 y/o female referred to [place] by [place] home health [facility]) agency after their services were recently completed. Patient had recent hospitalization June 14-27, 2054for COPD exacerbation/ ARF, pneumonia with pleural effusion; during hospitalization, patient had (L) 2nd and 3rd toe amputations on 09/25/16 due to gangrene. Patient has history including, but not limited to, DM Type II, HTN, HLD, CKD stg III, dCHF, AF on coumadin, prior CVA, COPD on home O2, and protein calorie malnutrition with PEG, on tube feeding.   In his voicemail message, Merrily Pew confirmed that patient is currently active with [place], but could benefit from any/all resources available to her.  Returned Josh's call this morning, and left secure message, informing that I had placed follow up call to [place] and that I would return his call after I obtained additional information regarding possible [name] CM involvement in patient's care  Plan:  Will await follow up from [place] and will reach out to [place] again afterwards for care collaboration  Oneta Rack, RN, BSN, [place] Coordinator [facility] Care Management  [phone]

## 2016-11-02 NOTE — ED Provider Notes (Signed)
[facility] DEPT Provider Note   CSN: [id number] Arrival date & time: 11/02/16  1655     History   Chief Complaint Chief Complaint  Patient presents with  . Hypertension  . Headache    HPI Shannon [name] is a 83 y.o. female.  Pt presents to the ED today with elevated blood pressure and headache.  Pt's family checked bp yesterday and it was high.  It continued to be high throughout the day today.  The pt c/o headache and right jaw pain.  Pt denies any cp.  The pt said that she has no new weakness.      Past Medical History:  Diagnosis Date  . Acute respiratory failure ([place])   . Anemia    previous blood transfusions  . Arthritis    "all over"  . Asthma   . Bradycardia    requiring previous d/c of BB and reduction of amiodarone  . CAD (coronary artery disease)    nonobstructive per notes  . Chronic diastolic CHF (congestive heart failure) ([place])   . CKD (chronic kidney disease), stage III   . Complication of blood transfusion    "got the wrong blood type at [facility] in ~ 2015; no adverse reaction that we are aware of"/daughter, [name] (01/27/2016)  . COPD (chronic obstructive pulmonary disease) ([place])   . Depression    "light case"  . DVT (deep venous thrombosis) ([place]) 01/2016   a. LLE DVT 01/2016 - switched from Eliquis to Coumadin.  . Gastric stenosis    a. s/p stomach tube  . GERD (gastroesophageal reflux disease)   . History of blood transfusion    "several" (01/27/2016)  . History of stomach ulcers   . Hyperlipidemia   . Hypertension   . Hypothyroidism   . Paraesophageal hernia   . Perforated gastric ulcer ([place])   . Seasonal allergies   . SIADH (syndrome of inappropriate ADH production) ([place])    [name] 01/10/2015  . Small bowel obstruction ([place])    "I don't know how many" (01/11/2015)  . Stroke ([place])    "light one"  . Type II diabetes mellitus ([place])    "related to prednisone use  for > 20 yr; once predinose stopped; no more DM RX" (01/27/2016)  . UTI  (urinary tract infection) 02/08/2016  . Ventral hernia with bowel obstruction     Patient Active Problem List   Diagnosis Date Noted  . COPD with asthma ([place]) 10/03/2016  . Chronic deep vein thrombosis (DVT) of tibial vein of left lower extremity ([place]) 10/03/2016  . Gangrene of toe of left foot ([place]) 09/15/2016  . Recurrent aspiration pneumonia ([place])   . Atherosclerosis of native arteries of the extremities with gangrene ([place]) 07/28/2016  . Calcium channel blocker adverse reaction 06/08/2016  . Other specified hypothyroidism   . CHF (NYHA class IV, ACC/AHA stage D) ([place])   . Shortness of breath   . Pressure injury of skin 03/11/2016  . PEG (percutaneous endoscopic gastrostomy) status ([place]) 03/10/2016  . Encounter for therapeutic drug monitoring 02/16/2016  . Warfarin anticoagulation   . Chronic seasonal allergic rhinitis   . Atrial flutter ([place]) 01/24/2016  . Chronic diastolic CHF (congestive heart failure) ([place]) 01/24/2016  . Bilateral leg edema 12/29/2015  . Hypothyroidism due to medication   . Weakness   . Pyloric stenosis   . Pressure ulcer 09/15/2015  . Acute osteomyelitis of toe of right foot ([place])   . PVD (peripheral vascular disease) ([place])   . Adjustment disorder  with anxiety 08/29/2015  . Diabetic ulcer of toe of right foot associated with type 2 diabetes mellitus, with necrosis of muscle ([place])   . Diverticulitis of large intestine without perforation or abscess without bleeding   . Colitis 08/26/2015  . Esophageal candidiasis ([place])   . SOB (shortness of breath)   . Encounter for long-term (current) use of other high-risk medications   . Dysphagia 07/05/2015  . Asthma with allergic rhinitis 06/04/2015  . Hypertensive heart disease 03/18/2015  . Malnutrition of moderate degree 01/28/2015  . Syncope 01/27/2015  . Symptomatic bradycardia 01/21/2015  . Hypotension due to drugs 01/21/2015  . Protein-calorie malnutrition, severe 01/12/2015  . Cerebrovascular disease  07/14/2014  . Diabetic polyneuropathy associated with type 2 diabetes mellitus ([place]) 07/14/2014  . Hyperlipidemia 07/14/2014  . Gastric outlet obstruction 05/21/2014  . Unstable gait 04/29/2014  . Slurred speech   . Gastroparesis   . Slow transit constipation 02/03/2014  . Carotid stenosis 02/03/2014  . Demand ischemia ([place]) 01/21/2014  . Gastric bezoar 01/20/2014  . Anemia of chronic disease 01/18/2014  . Chronic kidney insufficiency, stage 3 (moderate) 01/18/2014  . Depression 09/29/2013  . Essential hypertension, benign 09/22/2013  . GERD (gastroesophageal reflux disease) 09/22/2013  . Dyslipidemia 09/22/2013  . Diabetes mellitus type 2 in nonobese ([place]) 09/22/2013  . Persistent atrial fibrillation ([place]) 09/22/2013  . CVA (cerebral vascular accident) ([place]) 09/22/2013  . Diabetic neuropathy ([place]) 09/22/2013  . Chronic gastrojejunal ulcer with perforation ([place]) 09/07/2013  . Accelerated hypertension 09/07/2013  . Systemic lupus erythematosus (SLE) inhibitor 09/07/2013  . Other specified abnormal immunological findings in serum 09/07/2013    Past Surgical History:  Procedure Laterality Date  . ABDOMINAL AORTOGRAM N/A 09/21/2016   Procedure: Abdominal Aortogram;  Surgeon: Waynetta Sandy, MD;  Location: [facility];  Service: Cardiovascular;  Laterality: N/A;  . AMPUTATION Left 09/25/2016   Procedure: AMPUTATION DIGIT- LEFT 2ND AND 3RD TOES;  Surgeon: Rosetta Posner, MD;  Location: [facility];  Service: Vascular;  Laterality: Left;  . CATARACT EXTRACTION W/ INTRAOCULAR LENS  IMPLANT, BILATERAL    . CHOLECYSTECTOMY OPEN    . COLECTOMY    . ESOPHAGOGASTRODUODENOSCOPY N/A 01/19/2014   Procedure: ESOPHAGOGASTRODUODENOSCOPY (EGD);  Surgeon: Irene Shipper, MD;  [facility];  Service: Endoscopy;  Laterality: N/A;  . ESOPHAGOGASTRODUODENOSCOPY N/A 01/20/2014   Procedure: ESOPHAGOGASTRODUODENOSCOPY (EGD);  Surgeon: Irene Shipper, MD;  [facility];  Service:  Endoscopy;  Laterality: N/A;  . ESOPHAGOGASTRODUODENOSCOPY N/A 03/19/2014   Procedure: ESOPHAGOGASTRODUODENOSCOPY (EGD);  Surgeon: Milus Banister, MD;  Location[facility];  Service: Endoscopy;  Laterality: N/A;  . ESOPHAGOGASTRODUODENOSCOPY N/A 07/08/2015   Procedure: ESOPHAGOGASTRODUODENOSCOPY (EGD);  Surgeon: Doran Stabler, MD;  Location[facility] ENDOSCOPY;  Service: Endoscopy;  Laterality: N/A;  . ESOPHAGOGASTRODUODENOSCOPY (EGD) WITH PROPOFOL N/A 09/15/2015   Procedure: ESOPHAGOGASTRODUODENOSCOPY (EGD) WITH PROPOFOL;  Surgeon: Manus Gunning, MD;  [facility];  Service: Gastroenterology;  Laterality: N/A;  . GASTROJEJUNOSTOMY     hx/notes 01/10/2015  . GASTROJEJUNOSTOMY N/A 09/23/2015   Procedure: OPEN GASTROJEJUNOSTOMY TUBE PLACEMENT;  Surgeon: Arta Bruce Kinsinger, MD;  [facility];  Service: General;  Laterality: N/A;  . GLAUCOMA SURGERY Bilateral   . HERNIA REPAIR  2015  . IR GENERIC HISTORICAL  01/07/2016   IR GJ TUBE CHANGE 01/07/2016 Jacqulynn Cadet, MD [facility]  . IR GENERIC HISTORICAL  01/27/2016   IR MECH REMOV OBSTRUC MAT ANY COLON TUBE W/FLUORO 01/27/2016 Markus Daft, MD [facility]  .  IR GENERIC HISTORICAL  02/07/2016   IR PATIENT EVAL TECH 0-60 MINS Darrell K Allred, PA-C [facility]  . IR GENERIC HISTORICAL  02/08/2016   IR GJ TUBE CHANGE 02/08/2016 Greggory Keen, MD [facility]  . IR GENERIC HISTORICAL  01/06/2016   IR GJ TUBE CHANGE 01/06/2016 [facility] OUT REF  . IR GENERIC HISTORICAL  05/02/2016   IR CM INJ ANY COLONIC TUBE W/FLUORO 05/02/2016 Arne Cleveland, MD [facility]  . IR GENERIC HISTORICAL  05/15/2016   IR GJ TUBE CHANGE 05/15/2016 Sandi Mariscal, MD [facility]  . IR GENERIC HISTORICAL  06/28/2016   IR GJ TUBE CHANGE 06/28/2016 [facility]  . IR PATIENT EVAL TECH 0-60 MINS  10/19/2016  . LAPAROTOMY N/A 01/20/2015   Procedure: EXPLORATORY LAPAROTOMY;  Surgeon: Coralie Keens, MD;  Location: [facility];  Service: General;  Laterality: N/A;   . LOWER EXTREMITY ANGIOGRAPHY Left 09/21/2016   Procedure: Lower Extremity Angiography;  Surgeon: Waynetta Sandy, MD;  Location: [facility];  Service: Cardiovascular;  Laterality: Left;  . LYSIS OF ADHESION N/A 01/20/2015   Procedure: LYSIS OF ADHESIONS < 1 HOUR;  Surgeon: Coralie Keens, MD;  Location: [facility];  Service: General;  Laterality: N/A;  . PERIPHERAL VASCULAR BALLOON ANGIOPLASTY Left 09/21/2016   Procedure: Peripheral Vascular Balloon Angioplasty;  Surgeon: Waynetta Sandy, MD;  Location: [facility];  Service: Cardiovascular;  Laterality: Left;  drug coated balloon  . TONSILLECTOMY    . TUBAL LIGATION    . VENTRAL HERNIA REPAIR  2015   incarcerated ventral hernia ([facility] 09/2013)/notes 01/10/2015    OB History    No data available       Home Medications    Prior to Admission medications   Medication Sig Start Date End Date Taking? Authorizing Provider  acetaminophen (TYLENOL) 500 MG tablet Take 1,000 mg by mouth every 6 (six) hours as needed (pain).   Yes [provider]  AMINO ACIDS-PROTEIN HYDROLYS PO Take 30 mLs by mouth 2 (two) times daily.   Yes [provider]  atorvastatin (LIPITOR) 10 MG tablet TAKE ONE TABLET BY MOUTH ONCE DAILY Patient taking differently: TAKE 10MG  BY MOUTH DAILY 05/22/16  Yes Gildardo Cranker, DO  b complex vitamins tablet Take 1 tablet by mouth daily.    Yes [provider]  benzonatate (TESSALON PERLES) 100 MG capsule Take two capsules by mouth three times daily as needed for cough 09/13/16  Yes Eulas Post, Brayton Layman, DO  calcium-vitamin D (OSCAL WITH D) 500-200 MG-UNIT per tablet Take 1 tablet by mouth daily with breakfast.    Yes [provider]  carvedilol (COREG) 6.25 MG tablet Take 1 tablet (6.25 mg total) by mouth 2 (two) times daily. 10/03/16 05/01/17 Yes Eulas Post, Monica, DO  clopidogrel (PLAVIX) 75 MG tablet TAKE 1 TABLET (75 MG TOTAL) BY MOUTH DAILY WITH BREAKFAST. 10/20/16  Yes  Eulas Post, [place], DO  feeding supplement (OSMOLITE 1 CAL) LIQD Take 237 mLs by mouth at bedtime.   Yes [provider]  fluticasone (FLONASE) 50 MCG/ACT nasal spray Place 2 sprays into both nostrils daily. 08/07/14  Yes Eulas Post, Monica, DO  gabapentin (NEURONTIN) 300 MG capsule Take 600 mg by mouth 3 (three) times daily.   Yes [provider]  hydrALAZINE (APRESOLINE) 25 MG tablet Take 1 tablet (25 mg total) by mouth 3 (three) times daily. 10/27/16  Yes Eulas Post, Monica, DO  insulin aspart (NOVOLOG FLEXPEN) 100 UNIT/ML FlexPen Before each meal 3 times a day, 140-199 - 2  units, 200-250 - 4 units, 251-299 - 6 units,  300-349 - 8 units,  350 or above 10 units. Insulin PEN if approved, provide syringes and needles if needed. 08/18/16  Yes Florencia Reasons, MD  Insulin Glargine (LANTUS) 100 UNIT/ML Solostar Pen Injection 0.1 ml (10 units) once daily to control blood sugar. Dx E11.9 11/01/16  Yes Eulas Post, Monica, DO  ipratropium-albuterol (DUONEB) 0.5-2.5 (3) MG/3ML SOLN Take 3 mLs by nebulization every 6 (six) hours as needed (sob). Use 3 times daily x 4 days then every 6 hours as needed. 03/21/16  Yes Eileen Stanford, PA-C  isosorbide mononitrate (IMDUR) 30 MG 24 hr tablet Take 30 mg by mouth daily. 07/20/16  Yes [provider]  latanoprost (XALATAN) 0.005 % ophthalmic solution Place 1 drop into both eyes at bedtime. 06/02/14  Yes Blanchie Serve, MD  levalbuterol [facility] HFA) 45 MCG/ACT inhaler Inhale 2 puffs into the lungs every 6 (six) hours as needed for wheezing or shortness of breath. 02/03/16  Yes Eulas Post, Brayton Layman, DO  levothyroxine (SYNTHROID, LEVOTHROID) 137 MCG tablet TAKE 1 TABLET (137 MCG TOTAL) BY MOUTH DAILY BEFORE BREAKFAST. 10/23/16  Yes Reed, Tiffany L, DO  Maltodextrin-Xanthan Gum (RESOURCE THICKENUP CLEAR) POWD Take 120 g by mouth as needed (use to thicken liquids.). 11/11/15  Yes Eugenie Filler, MD  mometasone-formoterol [facility]) 200-5 MCG/ACT AERO Inhale 2 puffs into the lungs  2 (two) times daily. 10/03/16  Yes Gildardo Cranker, DO  Multiple Vitamin (MULTIVITAMIN WITH MINERALS) TABS tablet Take 1 tablet by mouth daily.   Yes [provider]  nystatin (MYCOSTATIN/NYSTOP) powder Apply topically 3 (three) times daily. 09/26/16  Yes Nita Sells, MD  Polyethyl Glycol-Propyl Glycol 0.4-0.3 % SOLN Place 1 drop into both eyes 4 (four) times daily.    Yes [provider]  RESTASIS 0.05 % ophthalmic emulsion USE 1 DROP INTO BOTH EYES TWICE DAILY 06/28/16  Yes Estill Dooms, MD  sertraline (ZOLOFT) 100 MG tablet TAKE ONE TABLET BY MOUTH ONCE DAILY FOR DEPRESSION Patient taking differently: TAKE 100MG  BY MOUTH EVERY MORNING 04/10/16  Yes Gildardo Cranker, DO  warfarin (COUMADIN) 2.5 MG tablet TAKE AS DIRECTED BY COUMADIN CLINIC. Patient taking differently: 5MG  Mon/Thurs. 3.75MG  on all other days 10/10/16  Yes Dorothy Spark, MD  Water For Irrigation, Sterile (FREE WATER) SOLN Place 230 mLs into feeding tube 3 (three) times daily.   Yes [provider]  metoprolol tartrate (LOPRESSOR) 25 MG tablet Take 2 tablets (50 mg total) by mouth 2 (two) times daily. 11/02/16   Isla Pence, MD  predniSONE (STERAPRED UNI-PAK 21 TAB) 10 MG (21) TBPK tablet Use as directed Patient not taking: Reported on 11/02/2016 10/16/16   [name], NP    Family History Family History  Problem Relation Age of Onset  . Stroke Mother   . Hypertension Mother   . Diabetes Brother   . Heart attack Neg Hx     Social History Social History  Substance Use Topics  . Smoking status: Never Smoker  . Smokeless tobacco: Former Systems developer    Types: Snuff     Comment: "used snuff in my younger days"  . Alcohol use No     Allergies   Penicillins and Other   Review of Systems Review of Systems  Neurological: Positive for headaches.  All other systems reviewed and are negative.    Physical Exam Updated Vital Signs BP (!) 231/92   Pulse 76   Temp 98.3 F (36.8 C)  (Oral)  Resp 19   Ht 5\' 6"  (1.676 m)   Wt 65.8 kg (145 lb)   SpO2 97%   BMI 23.40 kg/m   Physical Exam  Constitutional: She is oriented to person, place, and time. She appears well-developed and well-nourished.  HENT:  Head: Normocephalic and atraumatic.  Right Ear: External ear normal.  Left Ear: External ear normal.  Nose: Nose normal.  Mouth/Throat: Oropharynx is clear and moist.  Eyes: Pupils are equal, round, and reactive to light. Conjunctivae and EOM are normal.  Neck: Normal range of motion. Neck supple.  Cardiovascular: Normal rate, regular rhythm, normal heart sounds and intact distal pulses.   Pulmonary/Chest: Effort normal and breath sounds normal.  Abdominal: Soft. Bowel sounds are normal.  Musculoskeletal: Normal range of motion.  Neurological: She is alert and oriented to person, place, and time.  Skin: Skin is warm.  Psychiatric: She has a normal mood and affect. Her behavior is normal. Judgment and thought content normal.  Nursing note and vitals reviewed.    ED Treatments / Results  Labs (all labs ordered are listed, but only abnormal results are displayed) Labs Reviewed  CBC - Abnormal; Notable for the following:       Result Value   Hemoglobin 11.1 (*)    HCT 32.8 (*)    RDW 18.0 (*)    All other components within normal limits  COMPREHENSIVE METABOLIC PANEL - Abnormal; Notable for the following:    Sodium 133 (*)    Chloride 98 (*)    Glucose, Bld 110 (*)    BUN 32 (*)    Creatinine, Ser 1.01 (*)    GFR calc non Af Amer 47 (*)    GFR calc Af Amer 55 (*)    All other components within normal limits  URINALYSIS, ROUTINE W REFLEX MICROSCOPIC - Abnormal; Notable for the following:    Protein, ur 100 (*)    All other components within normal limits  TROPONIN I    EKG  EKG Interpretation None      EKG (did not go through on MUSE):  nsr 78.  PVCs.  No st or t wave changes  Radiology Ct Head Wo Contrast  Result Date: 11/02/2016 CLINICAL  DATA:  Headache, hypertension EXAM: CT HEAD WITHOUT CONTRAST TECHNIQUE: Contiguous axial images were obtained from the base of the skull through the vertex without intravenous contrast. COMPARISON:  02/28/2016 FINDINGS: Brain: No evidence of acute infarction, hemorrhage, hydrocephalus, extra-axial collection or mass lesion/mass effect. Mild cortical atrophy. Subcortical white matter and periventricular small vessel ischemic changes. Vascular: Intracranial atherosclerosis. Skull: Normal. Negative for fracture or focal lesion. Sinuses/Orbits: The visualized paranasal sinuses are essentially clear. The mastoid air cells are unopacified. Other: None. IMPRESSION: No evidence of acute intracranial abnormality. Atrophy with small vessel ischemic changes. Electronically Signed   By: Julian Hy M.D.   On: 11/02/2016 17:48    Procedures Procedures (including critical care time)  Medications Ordered in ED Medications  labetalol (NORMODYNE,TRANDATE) injection 20 mg (20 mg Intravenous Given 11/02/16 1948)  ondansetron (ZOFRAN) injection 4 mg (4 mg Intravenous Given 11/02/16 1947)  morphine 4 MG/ML injection 2 mg (2 mg Intravenous Given 11/02/16 1948)     Initial Impression / Assessment and Plan / ED Course  I have reviewed the triage vital signs and the nursing notes.  Pertinent labs & imaging results that were available during my care of the patient were reviewed by me and considered in my medical decision making (see chart for details).  Pt is feeling much better.  Her headache is gone.  Pt told to f/u with pcp and return if worse.  She will be started on a low dose lopressor until she can see her doctor.   Final Clinical Impressions(s) / ED Diagnoses   Final diagnoses:  Acute nonintractable headache, unspecified headache type  Essential hypertension    New Prescriptions New Prescriptions   METOPROLOL TARTRATE (LOPRESSOR) 25 MG TABLET    Take 2 tablets (50 mg total) by mouth 2 (two) times  daily.     Isla Pence, MD 11/02/16 2013

## 2016-11-02 NOTE — Telephone Encounter (Signed)
Daughter called back and stated pt's bp is still elevated and wanted to know what to do. Per daughter it's now 202/90 p86. She's scared pt is having a stroke, daughter wanting to know if she should take pt to ED? I advised I couldn't tell her not to and that I would send message to Dr. Eulas Post. Please advise

## 2016-11-02 NOTE — ED Notes (Signed)
Pt verbalized understanding discharge instructions and denies any further needs or questions at this time. VS stable, ambulatory and steady gait.   

## 2016-11-02 NOTE — ED Triage Notes (Signed)
Pt's BP has been running high for several days per family. Today BP has been 620 systolic upon awakening. Pt also has headache. No other neuro symptoms

## 2016-11-02 NOTE — Telephone Encounter (Signed)
Daughter Girard Cooter called BP this am 209/93, took it again after taking medication 190/86, yesterday 218/95, last night 199/95. She started the Hydralazine 10/28/16 taking it 3 times daily. She's afraid she might have a stroke. Made an appointment with Dr. Eulas Post for tomorrow, for she can re-evaluate her and maybe do lab work. This was good with daughter.

## 2016-11-02 NOTE — Telephone Encounter (Signed)
Spoke with patient's daughter and advised pt needs to be seen in the ED. Daughter will keep appt scheduled for tomorrow just in case pt is discharged.

## 2016-11-02 NOTE — Telephone Encounter (Signed)
Please send to ED for eval if BP is elevated that high

## 2016-11-02 NOTE — ED Notes (Signed)
ED Provider at bedside. 

## 2016-11-03 ENCOUNTER — Encounter: Payer: Self-pay | Admitting: Internal Medicine

## 2016-11-03 ENCOUNTER — Encounter: Payer: Self-pay | Admitting: *Deleted

## 2016-11-03 ENCOUNTER — Ambulatory Visit (INDEPENDENT_AMBULATORY_CARE_PROVIDER_SITE_OTHER): Payer: Medicare Other | Admitting: Internal Medicine

## 2016-11-03 ENCOUNTER — Other Ambulatory Visit: Payer: Self-pay | Admitting: *Deleted

## 2016-11-03 VITALS — BP 146/72 | HR 70 | Temp 97.7°F | Ht 66.0 in | Wt 149.0 lb

## 2016-11-03 DIAGNOSIS — I70262 Atherosclerosis of native arteries of extremities with gangrene, left leg: Secondary | ICD-10-CM

## 2016-11-03 DIAGNOSIS — I10 Essential (primary) hypertension: Secondary | ICD-10-CM

## 2016-11-03 DIAGNOSIS — Z931 Gastrostomy status: Secondary | ICD-10-CM | POA: Diagnosis not present

## 2016-11-03 DIAGNOSIS — K5900 Constipation, unspecified: Secondary | ICD-10-CM

## 2016-11-03 DIAGNOSIS — I480 Paroxysmal atrial fibrillation: Secondary | ICD-10-CM | POA: Diagnosis not present

## 2016-11-03 DIAGNOSIS — I5032 Chronic diastolic (congestive) heart failure: Secondary | ICD-10-CM

## 2016-11-03 MED ORDER — CARVEDILOL 6.25 MG PO TABS: ORAL_TABLET | ORAL | 6 refills | Status: DC

## 2016-11-03 NOTE — Patient Outreach (Signed)
[facility]) Care Management [place] Coordination  11/03/2016  Erisha Paugh 1923-11-23 [id number]  Successful telephone outreach to Billey Chang, NP [place] [phone]) regarding Keysi Oelkers, 83 y/o female referred to [place] by [place] home health [facility]) agency after their services were recently completed. Patient had recent hospitalization June 14-27, 2054for COPD exacerbation/ ARF, pneumonia with pleural effusion; during hospitalization, patient had (L) 2nd and 3rd toe amputations on 09/25/16 due to gangrene. Patient has history including, but not limited to, DM Type II, HTN, HLD, CKD stg III, dCHF, AF on coumadin, prior CVA, COPD on home O2, and protein calorie malnutrition with PEG, on tube feeding.   Confirmed with Josh today that I had received follow up from [place] team, approving [facility] CM involvement in patient's care in addition to services provided by [facility].  Discussed overall patient plan of care with Josh today, made him aware that patient had visited ED yesterday for headache and elevated BP; also made Josh aware that [place] was also involved in patient's care for evaluation/ assessment of patient pharmacy needs.  Plan:  Will continue collaborating with Josh/ Palliative care of [facility] as indicated for patient care needs.  Oneta Rack, RN, BSN, [employer] [facility] Care Management  [phone]

## 2016-11-03 NOTE — Patient Instructions (Addendum)
Check blood pressure/pulse (heart rate) 1 hr after medication and record. GOAL <150/80.  Call cardiology Dr Meda Coffee to make follow up appt  TAKE DULCOLAX SUPPOSITORY AS NEEDED FOR CONSTIPATION  Continue other medications as ordered  CHANGE [place] APPT SCHEDULED NEXT WEEK TO NEXT MONTH

## 2016-11-03 NOTE — Patient Outreach (Signed)
[place] [facility]) [place] Telephone Outreach  11/03/2016  Afsheen Antony 09-Mar-1924 [id number]  Unsuccessful telephone outreach to Soledad Gerlach, daughter/ caregiver for [name], 83 y/o female referred to [place] by [place] home health [facility]) agency after their services were recently completed.  Patient had recent hospitalization June 14-27, 2018 for COPD exacerbation/  ARF, pneumonia with pleural effusion; during hospitalization, patient had (L) 2nd and 3rd toe amputations on 09/25/16 due to gangrene.  Patient has history including, but not limited to, DM Type II, HTN, HLD, CKD stg III, dCHF, AF on coumadin, prior CVA, COPD on home O2, and protein calorie malnutrition with PEG, on tube feeding.    During previous outreach earlier this week, patient's caregiver/ daughter had reported that [place] were currently involved in patient's care, and this was confirmed by Billey Chang, NP of [place].  Call today was to confirm with patient's caregiver that [place] could also be involved in patient's ongoing care if caregiver was interested in [place] services.  HIPAA compliant voice mail message left for patient, requesting return call back.  Plan:  Will re-attempt [place] telephone outreach again next week if I do not hear back from patient first.  Oneta Rack, RN, BSN, [place] Coordinator [facility] Care Management  [phone]

## 2016-11-03 NOTE — Progress Notes (Signed)
Patient ID: Shannon [name], female   DOB: 1923/06/16, 83 y.o.   MRN: [id number]    Location:  [facility] Place of Service: OFFICE  Chief Complaint  Patient presents with  . Hypertension    Blood pressure yesterday 209/93.She had been taking the Hydralazine.  Had headache daughter took her to ED they gave her Metoprolol 25 mg take 2 twice daily, hasn't started yet. Here with daughter Shannon [name]    HPI:  83 yo female seen today for elevated BP with associated HA. She was seen in the ED yesterday and given IV labetalol for BP 231/92. She was Rx metoprolol at d/c. Labs showed no significant abnormalities. She is already taking coreg  She states she has neck pain but no HA or dizziness. She has constipation and believes she needs enema  HTN/afib/aflutter/hyperlipidemia/chronic dHF - BP uncontrolled on coreg, hydralazine and imdur. Cholesterol stable on lipitor. Rate controlled on coreg. Lasix stopped prior to recent hospital d/c. She takes coumadin for anticoagulation. INR Followed by cardio Dr Delton See. EF 60-65%   Past Medical History:  Diagnosis Date  . Acute respiratory failure (HCC)   . Anemia    previous blood transfusions  . Arthritis    "all over"  . Asthma   . Bradycardia    requiring previous d/c of BB and reduction of amiodarone  . CAD (coronary artery disease)    nonobstructive per notes  . Chronic diastolic CHF (congestive heart failure) (HCC)   . CKD (chronic kidney disease), stage III   . Complication of blood transfusion    "got the wrong blood type at [facility] in ~ 2015; no adverse reaction that we are aware of"/daughter, Shannon [name] (01/27/2016)  . COPD (chronic obstructive pulmonary disease) (HCC)   . Depression    "light case"  . DVT (deep venous thrombosis) (HCC) 01/2016   a. LLE DVT 01/2016 - switched from Eliquis to Coumadin.  . Gastric stenosis    a. s/p stomach tube  . GERD (gastroesophageal reflux disease)   . History of blood transfusion    "several" (01/27/2016)  .  History of stomach ulcers   . Hyperlipidemia   . Hypertension   . Hypothyroidism   . Paraesophageal hernia   . Perforated gastric ulcer (HCC)   . Seasonal allergies   . SIADH (syndrome of inappropriate ADH production) (HCC)    Shannon [name] 01/10/2015  . Small bowel obstruction (HCC)    "I don't know how many" (01/11/2015)  . Stroke (HCC)    "light one"  . Type II diabetes mellitus (HCC)    "related to prednisone use  for > 20 yr; once predinose stopped; no more DM RX" (01/27/2016)  . UTI (urinary tract infection) 02/08/2016  . Ventral hernia with bowel obstruction     Past Surgical History:  Procedure Laterality Date  . ABDOMINAL AORTOGRAM N/A 09/21/2016   Procedure: Abdominal Aortogram;  Surgeon: Maeola Harman, MD;  Location: [facility] INVASIVE CV LAB;  Service: Cardiovascular;  Laterality: N/A;  . AMPUTATION Left 09/25/2016   Procedure: AMPUTATION DIGIT- LEFT 2ND AND 3RD TOES;  Surgeon: Larina Earthly, MD;  Location: [facility] OR;  Service: Vascular;  Laterality: Left;  . CATARACT EXTRACTION W/ INTRAOCULAR LENS  IMPLANT, BILATERAL    . CHOLECYSTECTOMY OPEN    . COLECTOMY    . ESOPHAGOGASTRODUODENOSCOPY N/A 01/19/2014   Procedure: ESOPHAGOGASTRODUODENOSCOPY (EGD);  Surgeon: Hilarie Fredrickson, MD;  Location: [facility] ENDOSCOPY;  Service: Endoscopy;  Laterality: N/A;  . ESOPHAGOGASTRODUODENOSCOPY N/A 01/20/2014   Procedure: ESOPHAGOGASTRODUODENOSCOPY (  EGD);  Surgeon: Hilarie Fredrickson, MD;  Location: [facility] ENDOSCOPY;  Service: Endoscopy;  Laterality: N/A;  . ESOPHAGOGASTRODUODENOSCOPY N/A 03/19/2014   Procedure: ESOPHAGOGASTRODUODENOSCOPY (EGD);  Surgeon: Rachael Fee, MD;  Location: [facility] ENDOSCOPY;  Service: Endoscopy;  Laterality: N/A;  . ESOPHAGOGASTRODUODENOSCOPY N/A 07/08/2015   Procedure: ESOPHAGOGASTRODUODENOSCOPY (EGD);  Surgeon: Sherrilyn Rist, MD;  Location: [facility] ENDOSCOPY;  Service: Endoscopy;  Laterality: N/A;  . ESOPHAGOGASTRODUODENOSCOPY (EGD) WITH PROPOFOL N/A 09/15/2015   Procedure:  ESOPHAGOGASTRODUODENOSCOPY (EGD) WITH PROPOFOL;  Surgeon: Ruffin Frederick, MD;  Location: [facility] ENDOSCOPY;  Service: Gastroenterology;  Laterality: N/A;  . GASTROJEJUNOSTOMY     hx/notes 01/10/2015  . GASTROJEJUNOSTOMY N/A 09/23/2015   Procedure: OPEN GASTROJEJUNOSTOMY TUBE PLACEMENT;  Surgeon: De Blanch Kinsinger, MD;  Location: [facility] ORS;  Service: General;  Laterality: N/A;  . GLAUCOMA SURGERY Bilateral   . HERNIA REPAIR  2015  . IR GENERIC HISTORICAL  01/07/2016   IR GJ TUBE CHANGE 01/07/2016 Malachy Moan, MD [facility]INTER[facility] RAD  . IR GENERIC HISTORICAL  01/27/2016   IR MECH REMOV OBSTRUC MAT ANY COLON TUBE W/FLUORO 01/27/2016 Richarda Overlie, MD [facility]  . IR GENERIC HISTORICAL  02/07/2016   IR PATIENT EVAL TECH 0-60 MINS Darrell K Allred, PA-C [facility]  . IR GENERIC HISTORICAL  02/08/2016   IR GJ TUBE CHANGE 02/08/2016 Berdine Dance, MD [facility]  . IR GENERIC HISTORICAL  01/06/2016   IR GJ TUBE CHANGE 01/06/2016 [facility]-RAD OUT REF  . IR GENERIC HISTORICAL  05/02/2016   IR CM INJ ANY COLONIC TUBE W/FLUORO 05/02/2016 Oley Balm, MD [facility]  . IR GENERIC HISTORICAL  05/15/2016   IR GJ TUBE CHANGE 05/15/2016 Simonne Come, MD [facility]  . IR GENERIC HISTORICAL  06/28/2016   IR GJ TUBE CHANGE 06/28/2016 [facility]  . IR PATIENT EVAL TECH 0-60 MINS  10/19/2016  . LAPAROTOMY N/A 01/20/2015   Procedure: EXPLORATORY LAPAROTOMY;  Surgeon: Abigail Miyamoto, MD;  Location: [facility] OR;  Service: General;  Laterality: N/A;  . LOWER EXTREMITY ANGIOGRAPHY Left 09/21/2016   Procedure: Lower Extremity Angiography;  Surgeon: Maeola Harman, MD;  Location: [facility] INVASIVE CV LAB;  Service: Cardiovascular;  Laterality: Left;  . LYSIS OF ADHESION N/A 01/20/2015   Procedure: LYSIS OF ADHESIONS < 1 HOUR;  Surgeon: Abigail Miyamoto, MD;  Location: [facility] OR;  Service: General;  Laterality: N/A;  . PERIPHERAL VASCULAR BALLOON ANGIOPLASTY Left 09/21/2016   Procedure: Peripheral Vascular Balloon  Angioplasty;  Surgeon: Maeola Harman, MD;  Location: [facility] INVASIVE CV LAB;  Service: Cardiovascular;  Laterality: Left;  drug coated balloon  . TONSILLECTOMY    . TUBAL LIGATION    . VENTRAL HERNIA REPAIR  2015   incarcerated ventral hernia ([facility] 09/2013)/notes 01/10/2015    Patient Care Team: Kirt Boys, DO as PCP - General (Internal Medicine) Jason Coop, MD as Referring Physician (Cardiology) Felecia Shelling, DPM as Consulting Physician (Podiatry) Ruedinger, Maryagnes Amos, [facility] (Pharmacist) Michaela Corner, RN as [facility] Care Management  Social History   Social History  . Marital status: Widowed    Spouse name: N/A  . Number of children: 3  . Years of education: N/A   Occupational History  . Not on file.   Social History Main Topics  . Smoking status: Never Smoker  . Smokeless tobacco: Former Neurosurgeon    Types: Snuff     Comment: "used snuff in my younger days"  . Alcohol use No  . Drug use: No  .  Sexual activity: No   Other Topics Concern  . Not on file   Social History Narrative   Married in [place], lives in one story house with 2 people and no pets   Occupation: ?   Has a living Will, [place], and doesn't have DNR   Was living with Husband (in frail health) in [place] until 09/2013.  After her [facility] admission they both came to live with daughter in [place]     reports that she has never smoked. She has quit using smokeless tobacco. Her smokeless tobacco use included Snuff. She reports that she does not drink alcohol or use drugs.  Family History  Problem Relation Age of Onset  . Stroke Mother   . Hypertension Mother   . Diabetes Brother   . Heart attack Neg Hx    Family Status  Relation Status  . Mother Deceased  . Father Deceased  . MGM Deceased  . MGF Deceased  . PGM Deceased  . PGF Deceased  . Brother (Not Specified)  . Daughter Alive  . Neg Hx (Not Specified)     Allergies  Allergen Reactions  . Penicillins  Itching, Rash and Other (See Comments)    Tolerated unasyn, zosyn & cephalosporins in the past.  Has patient had a PCN reaction causing immediate rash, facial/tongue/throat swelling, SOB or lightheadedness with hypotension: Yes Has patient had a PCN reaction causing severe rash involving mucus membranes or skin necrosis: Unk Has patient had a PCN reaction that required hospitalization: Unk Has patient had a PCN reaction occurring within the last 10 years: Unk If all of the above answers are "NO", then may proceed with Cephalosporins  . Other     Patient tolerates amoxicillin and cephalosporins    Medications: Patient's Medications  New Prescriptions   No medications on file  Previous Medications   ACETAMINOPHEN (TYLENOL) 500 MG TABLET    Take 1,000 mg by mouth every 6 (six) hours as needed (pain).   AMINO ACIDS-PROTEIN HYDROLYS PO    Take 30 mLs by mouth 2 (two) times daily.   ATORVASTATIN (LIPITOR) 10 MG TABLET    Take 10 mg by mouth. Take one tablet daily for cholesterol   B COMPLEX VITAMINS TABLET    Take 1 tablet by mouth daily.    BENZONATATE (TESSALON PERLES) 100 MG CAPSULE    Take two capsules by mouth three times daily as needed for cough   CALCIUM-VITAMIN D (OSCAL WITH D) 500-200 MG-UNIT PER TABLET    Take 1 tablet by mouth daily with breakfast.    CARVEDILOL (COREG) 6.25 MG TABLET    Take 1 tablet (6.25 mg total) by mouth 2 (two) times daily.   CLOPIDOGREL (PLAVIX) 75 MG TABLET    TAKE 1 TABLET (75 MG TOTAL) BY MOUTH DAILY WITH BREAKFAST.   FEEDING SUPPLEMENT (OSMOLITE 1 CAL) LIQD    Take 237 mLs by mouth at bedtime.   FLUTICASONE (FLONASE) 50 MCG/ACT NASAL SPRAY    Place 2 sprays into both nostrils daily.   GABAPENTIN (NEURONTIN) 300 MG CAPSULE    Take 600 mg by mouth 3 (three) times daily.   HYDRALAZINE (APRESOLINE) 25 MG TABLET    Take 1 tablet (25 mg total) by mouth 3 (three) times daily.   INSULIN ASPART (NOVOLOG FLEXPEN) 100 UNIT/ML FLEXPEN    Before each meal 3 times a  day, 140-199 - 2 units, 200-250 - 4 units, 251-299 - 6 units,  300-349 - 8 units,  350 or above  10 units. Insulin PEN if approved, provide syringes and needles if needed.   INSULIN GLARGINE (LANTUS) 100 UNIT/ML SOLOSTAR PEN    Injection 0.1 ml (10 units) once daily to control blood sugar. Dx E11.9   IPRATROPIUM-ALBUTEROL (DUONEB) 0.5-2.5 (3) MG/3ML SOLN    Take 3 mLs by nebulization every 6 (six) hours as needed (sob). Use 3 times daily x 4 days then every 6 hours as needed.   ISOSORBIDE MONONITRATE (IMDUR) 30 MG 24 HR TABLET    Take 30 mg by mouth daily.   LATANOPROST (XALATAN) 0.005 % OPHTHALMIC SOLUTION    Place 1 drop into both eyes at bedtime.   LEVALBUTEROL (XOPENEX HFA) 45 MCG/ACT INHALER    Inhale 2 puffs into the lungs every 6 (six) hours as needed for wheezing or shortness of breath.   LEVOTHYROXINE (SYNTHROID, LEVOTHROID) 137 MCG TABLET    TAKE 1 TABLET (137 MCG TOTAL) BY MOUTH DAILY BEFORE BREAKFAST.   MALTODEXTRIN-XANTHAN GUM (RESOURCE THICKENUP CLEAR) POWD    Take 120 g by mouth as needed (use to thicken liquids.).   METOPROLOL TARTRATE (LOPRESSOR) 25 MG TABLET    Take 2 tablets (50 mg total) by mouth 2 (two) times daily.   MOMETASONE-FORMOTEROL (DULERA) 200-5 MCG/ACT AERO    Inhale 2 puffs into the lungs 2 (two) times daily.   MULTIPLE VITAMIN (MULTIVITAMIN WITH MINERALS) TABS TABLET    Take 1 tablet by mouth daily.   NYSTATIN (MYCOSTATIN/NYSTOP) POWDER    Apply topically 3 (three) times daily.   POLYETHYL GLYCOL-PROPYL GLYCOL 0.4-0.3 % SOLN    Place 1 drop into both eyes 4 (four) times daily.    RESTASIS 0.05 % OPHTHALMIC EMULSION    USE 1 DROP INTO BOTH EYES TWICE DAILY   SERTRALINE (ZOLOFT) 100 MG TABLET    Take 100 mg by mouth. Take one tablet each morning for depression   WARFARIN (COUMADIN) 2.5 MG TABLET    TAKE AS DIRECTED BY COUMADIN CLINIC.   WATER FOR IRRIGATION, STERILE (FREE WATER) SOLN    Place 230 mLs into feeding tube 3 (three) times daily.  Modified Medications   No  medications on file  Discontinued Medications   ATORVASTATIN (LIPITOR) 10 MG TABLET    TAKE ONE TABLET BY MOUTH ONCE DAILY   PREDNISONE (STERAPRED UNI-PAK 21 TAB) 10 MG (21) TBPK TABLET    Use as directed   SERTRALINE (ZOLOFT) 100 MG TABLET    TAKE ONE TABLET BY MOUTH ONCE DAILY FOR DEPRESSION    Review of Systems  Musculoskeletal: Positive for neck pain.  All other systems reviewed and are negative.   Vitals:   11/03/16 1117  BP: (!) 146/72  Pulse: 70  Temp: 97.7 F (36.5 C)  TempSrc: Oral  SpO2: 98%  Weight: 149 lb (67.6 kg)  Height: 5\' 6"  (1.676 m)   Body mass index is 24.05 kg/m.  Physical Exam  Constitutional: She is oriented to person, place, and time. She appears well-developed and well-nourished.  Frail appearing in NAD  HENT:  Mouth/Throat: Oropharynx is clear and moist. No oropharyngeal exudate.  MMM; no oral thrush  Eyes: Pupils are equal, round, and reactive to light. No scleral icterus.  Neck: Neck supple. Carotid bruit is not present. No tracheal deviation present.  Cardiovascular: Normal rate, regular rhythm and intact distal pulses.  Exam reveals no gallop and no friction rub.   Murmur (1/6 SEM) heard. +1 pitting LE edema b/l. no calf TTP.   Pulmonary/Chest: Effort normal and breath sounds normal.  No stridor. No respiratory distress. She has no wheezes. She has no rales.  Abdominal: Soft. Normal appearance and bowel sounds are normal. She exhibits no distension and no mass. There is no hepatomegaly. There is tenderness. There is no rigidity, no rebound and no guarding. A hernia is present. Hernia confirmed positive in the ventral area.  Peg site has blood around it but no redness, TTP or d/c.  Musculoskeletal: She exhibits edema and tenderness.  Lymphadenopathy:    She has no cervical adenopathy.  Neurological: She is alert and oriented to person, place, and time.  Skin: Skin is warm and dry. No rash noted.  Psychiatric: She has a normal mood and affect.  Her behavior is normal. Thought content normal.     Labs reviewed: Admission on 11/02/2016, Discharged on 11/02/2016  Component Date Value Ref Range Status  . WBC 11/02/2016 8.0  4.0 - 10.5 K/uL Final  . RBC 11/02/2016 3.91  3.87 - 5.11 MIL/uL Final  . Hemoglobin 11/02/2016 11.1* 12.0 - 15.0 g/dL Final  . HCT 40/98/1191 32.8* 36.0 - 46.0 % Final  . MCV 11/02/2016 83.9  78.0 - 100.0 fL Final  . MCH 11/02/2016 28.4  26.0 - 34.0 pg Final  . MCHC 11/02/2016 33.8  30.0 - 36.0 g/dL Final  . RDW 47/82/9562 18.0* 11.5 - 15.5 % Final  . Platelets 11/02/2016 231  150 - 400 K/uL Final  . Sodium 11/02/2016 133* 135 - 145 mmol/L Final  . Potassium 11/02/2016 4.4  3.5 - 5.1 mmol/L Final  . Chloride 11/02/2016 98* 101 - 111 mmol/L Final  . CO2 11/02/2016 29  22 - 32 mmol/L Final  . Glucose, Bld 11/02/2016 110* 65 - 99 mg/dL Final  . BUN 13/11/6576 32* 6 - 20 mg/dL Final  . Creatinine, Ser 11/02/2016 1.01* 0.44 - 1.00 mg/dL Final  . Calcium 46/96/2952 9.7  8.9 - 10.3 mg/dL Final  . Total Protein 11/02/2016 7.0  6.5 - 8.1 g/dL Final  . Albumin 84/13/2440 3.5  3.5 - 5.0 g/dL Final  . AST 01/28/2535 24  15 - 41 U/L Final  . ALT 11/02/2016 19  14 - 54 U/L Final  . Alkaline Phosphatase 11/02/2016 58  38 - 126 U/L Final  . Total Bilirubin 11/02/2016 0.5  0.3 - 1.2 mg/dL Final  . GFR calc non Af Amer 11/02/2016 47* >60 mL/min Final  . GFR calc Af Amer 11/02/2016 55* >60 mL/min Final   Comment: (NOTE) The eGFR has been calculated using the CKD EPI equation. This calculation has not been validated in all clinical situations. eGFR's persistently <60 mL/min signify possible Chronic Kidney Disease.   . Anion gap 11/02/2016 6  5 - 15 Final  . Color, Urine 11/02/2016 YELLOW  YELLOW Final  . APPearance 11/02/2016 CLEAR  CLEAR Final  . Specific Gravity, Urine 11/02/2016 1.006  1.005 - 1.030 Final  . pH 11/02/2016 8.0  5.0 - 8.0 Final  . Glucose, UA 11/02/2016 NEGATIVE  NEGATIVE mg/dL Final  . Hgb urine  dipstick 11/02/2016 NEGATIVE  NEGATIVE Final  . Bilirubin Urine 11/02/2016 NEGATIVE  NEGATIVE Final  . Ketones, ur 11/02/2016 NEGATIVE  NEGATIVE mg/dL Final  . Protein, ur 64/40/3474 100* NEGATIVE mg/dL Final  . Nitrite 25/95/6387 NEGATIVE  NEGATIVE Final  . Leukocytes, UA 11/02/2016 NEGATIVE  NEGATIVE Final  . RBC / HPF 11/02/2016 6-30  0 - 5 RBC/hpf Final  . WBC, UA 11/02/2016 0-5  0 - 5 WBC/hpf Final  . Bacteria, UA 11/02/2016 NONE SEEN  NONE SEEN Final  . Squamous Epithelial / LPF 11/02/2016 NONE SEEN  NONE SEEN Final  . Troponin I 11/02/2016 <0.03  <0.03 ng/mL Final  Anti-coag visit on 10/26/2016  Component Date Value Ref Range Status  . INR 10/26/2016 3.3   Final  Anti-coag visit on 10/19/2016  Component Date Value Ref Range Status  . INR 10/19/2016 2.0   Final  Anti-coag visit on 10/12/2016  Component Date Value Ref Range Status  . INR 10/12/2016 1.9   Final   [facility] RN Noreene Larsson  Anti-coag visit on 10/05/2016  Component Date Value Ref Range Status  . INR 10/05/2016 1.1   Final   [facility]  Office Visit on 10/03/2016  Component Date Value Ref Range Status  . WBC 10/03/2016 5.8  3.8 - 10.8 K/uL Final  . RBC 10/03/2016 3.75* 3.80 - 5.10 MIL/uL Final  . Hemoglobin 10/03/2016 10.5* 11.7 - 15.5 g/dL Final  . HCT 44/04/270 33.2* 35.0 - 45.0 % Final  . MCV 10/03/2016 88.5  80.0 - 100.0 fL Final  . MCH 10/03/2016 28.0  27.0 - 33.0 pg Final  . MCHC 10/03/2016 31.6* 32.0 - 36.0 g/dL Final  . RDW 53/66/4403 17.8* 11.0 - 15.0 % Final  . Platelets 10/03/2016 358  140 - 400 K/uL Final  . MPV 10/03/2016 9.8  7.5 - 12.5 fL Final  . Neutro Abs 10/03/2016 3190  1,500 - 7,800 cells/uL Final  . Lymphs Abs 10/03/2016 1276  850 - 3,900 cells/uL Final  . Monocytes Absolute 10/03/2016 696  200 - 950 cells/uL Final  . Eosinophils Absolute 10/03/2016 580* 15 - 500 cells/uL Final  . Basophils Absolute 10/03/2016 58  0 - 200 cells/uL Final  . Neutrophils Relative % 10/03/2016 55  % Final  . Lymphocytes  Relative 10/03/2016 22  % Final  . Monocytes Relative 10/03/2016 12  % Final  . Eosinophils Relative 10/03/2016 10  % Final  . Basophils Relative 10/03/2016 1  % Final  . Smear Review 10/03/2016 Criteria for review not met   Final  . Sodium 10/03/2016 134* 135 - 146 mmol/L Final  . Potassium 10/03/2016 4.7  3.5 - 5.3 mmol/L Final  . Chloride 10/03/2016 97* 98 - 110 mmol/L Final  . CO2 10/03/2016 27  20 - 31 mmol/L Final  . Glucose, Bld 10/03/2016 102* 65 - 99 mg/dL Final  . BUN 47/42/5956 32* 7 - 25 mg/dL Final  . Creat 38/75/6433 1.10* 0.60 - 0.88 mg/dL Final   Comment:   For patients > or = 81 years of age: The upper reference limit for Creatinine is approximately 13% higher for people identified as African-American.     . Total Bilirubin 10/03/2016 0.4  0.2 - 1.2 mg/dL Final  . Alkaline Phosphatase 10/03/2016 73  33 - 130 U/L Final  . AST 10/03/2016 20  10 - 35 U/L Final  . ALT 10/03/2016 25  6 - 29 U/L Final  . Total Protein 10/03/2016 7.1  6.1 - 8.1 g/dL Final  . Albumin 29/51/8841 3.2* 3.6 - 5.1 g/dL Final  . Calcium 66/09/3014 9.1  8.6 - 10.4 mg/dL Final  . GFR, Est African American 10/03/2016 51* >=60 mL/min Final  . GFR, Est Non African American 10/03/2016 44* >=60 mL/min Final  Admission on 09/14/2016, Discharged on 09/27/2016  No results displayed because visit has over 200 results.    Anti-coag visit on 09/13/2016  Component Date Value Ref Range Status  . INR 09/13/2016 2.9   Final  Admission on 08/12/2016,  Discharged on 08/18/2016  No results displayed because visit has over 200 results.    Anti-coag visit on 08/11/2016  Component Date Value Ref Range Status  . INR 08/11/2016 2.4   Final  There may be more visits with results that are not included.    Ct Head Wo Contrast  Result Date: 11/02/2016 CLINICAL DATA:  Headache, hypertension EXAM: CT HEAD WITHOUT CONTRAST TECHNIQUE: Contiguous axial images were obtained from the base of the skull through the vertex  without intravenous contrast. COMPARISON:  02/28/2016 FINDINGS: Brain: No evidence of acute infarction, hemorrhage, hydrocephalus, extra-axial collection or mass lesion/mass effect. Mild cortical atrophy. Subcortical white matter and periventricular small vessel ischemic changes. Vascular: Intracranial atherosclerosis. Skull: Normal. Negative for fracture or focal lesion. Sinuses/Orbits: The visualized paranasal sinuses are essentially clear. The mastoid air cells are unopacified. Other: None. IMPRESSION: No evidence of acute intracranial abnormality. Atrophy with small vessel ischemic changes. Electronically Signed   By: Charline Bills M.D.   On: 11/02/2016 17:48   Ir Patient Eval Tech 0-60 Mins  Result Date: 10/19/2016 Daryel November     10/19/2016 11:27 AM Patient and her daughter came in today with concerns that the night time feeding tube adapter was no longer staying secure in the J tube.  The catheter has been working well otherwise.   We tried to see if we had access to a different catheter that had a different hub that may work.  I added a lopez valve to solve her immediate issue but reviewed the options with Dr Fredia Sorrow.  The GJ tube she used to have had a different hub but got easily clogged.  She has gastric outlet syndrome so she requires a j tube so the other available G tubes are not an option.  We can exchange it for a new catheter in hopes that it will be more "tacky" but she would eventually start having the same issues .  The patient has agreed to try the lopez valve and she it it solves the problem.  She will contact us if she wants [place] to exchange it    Assessment/Plan   ICD-10-CM   1. Essential hypertension, benign I10   2. Chronic diastolic CHF (congestive heart failure) (HCC) I50.32 carvedilol (COREG) 6.25 MG tablet  3. Paroxysmal atrial fibrillation (HCC) I48.0   4. PEG (percutaneous endoscopic gastrostomy) status (HCC) Z93.1   5. Constipation, unspecified constipation type  K59.00    Check blood pressure/pulse (heart rate) 1 hr after medication and record. GOAL <150/80.  Call cardiology Dr Delton See to make follow up appt  TAKE DULCOLAX SUPPOSITORY AS NEEDED FOR CONSTIPATION  Continue other medications as ordered  CHANGE [facility] APPT SCHEDULED NEXT WEEK TO NEXT MONTH   Satoru Milich S. Ancil Linsey  [facility] [address] [place], [place] [address] [phone] Cell (Monday-Friday 8 AM - 5 PM) [phone] After 5 PM and follow prompts

## 2016-11-07 ENCOUNTER — Encounter: Payer: Self-pay | Admitting: *Deleted

## 2016-11-07 ENCOUNTER — Other Ambulatory Visit: Payer: Self-pay | Admitting: *Deleted

## 2016-11-07 NOTE — Patient Outreach (Signed)
[facility]) Care Management [place] Telephone Outreach/ Case closure [place] Telephone Outreach Care Coordination   11/07/2016  Libni Fusaro 1923/10/20 [id number]  Successful telephone outreach to Soledad Gerlach, daughter/ caregiver for [name], 83 y/o female referred to [place] by [place] home health [facility]) agency after their services were recently completed.  Patient had recent hospitalization June 14-27, 2018 for COPD exacerbation/  ARF, pneumonia with pleural effusion; during hospitalization, patient had (L) 2nd and 3rd toe amputations on 09/25/16 due to gangrene.  Patient has history including, but not limited to, DM Type II, HTN, HLD, CKD stg III, dCHF, AF on coumadin, prior CVA, COPD on home O2, and protein calorie malnutrition with PEG, on tube feeding.  HIPAA/ identity verified through patient's daughter/ caregiver during phone call today.  Today, daughter continues to report that patient is "doing much better."  Explained to caregiver/ daughter that I had followed up with [facility] [place]/ Josh, NP, as well as [facility] CM leadership and obtained approval for [facility] CM to follow patient in addition to Palliative care services; today, daughter declines participation in [facility] services, stating several times that patient "is doing so well."  Daughter/ caregiver shares that she is keeping close contact with patient's PCP and that she believes "everything is under control right now."  [facility] CM services were again discussed with patient's caregiver, and I provided patient's daughter withmy direct phone number, the main [facility] CM office phone number, and the [facility] CM 24-hour nurse advice phone number should issues arise in the future.  I confirmed with caregiver that she has the phone number for [facility] [place], and she provided confirmation that she did have that number.  I also let caregiver/ daughter know that I would also reach out to Josb Borders, NP  with [facility] [place] to make him aware that caregiver has declined [facility] CM services at this time.  09:10:  Unsuccessful telephone outreach to Billey Chang, NP [place] [phone]), left secure voicemail message making him aware that patient's caregiver is currently declining [facility] CM services.  Plan:  Will close [facility] CM program as patient's caregiver has declined participating at this time, and will make patient's PCP aware of same.  Oneta Rack, RN, BSN, [employer] [facility] Care Management  [phone]

## 2016-11-08 ENCOUNTER — Ambulatory Visit: Payer: Medicare Other | Admitting: Internal Medicine

## 2016-11-08 ENCOUNTER — Other Ambulatory Visit: Payer: Self-pay | Admitting: Pharmacist

## 2016-11-08 ENCOUNTER — Ambulatory Visit (INDEPENDENT_AMBULATORY_CARE_PROVIDER_SITE_OTHER): Payer: Medicare Other

## 2016-11-08 DIAGNOSIS — I70262 Atherosclerosis of native arteries of extremities with gangrene, left leg: Secondary | ICD-10-CM

## 2016-11-08 DIAGNOSIS — Z5181 Encounter for therapeutic drug level monitoring: Secondary | ICD-10-CM | POA: Diagnosis not present

## 2016-11-08 DIAGNOSIS — I4892 Unspecified atrial flutter: Secondary | ICD-10-CM | POA: Diagnosis not present

## 2016-11-08 LAB — POCT INR: INR: 1.8

## 2016-11-08 NOTE — Patient Outreach (Signed)
[facility]) Care Management  11/08/2016  Shannon [name] 09-16-1923 [id number]  Patient was referred by [place], for patient assistance evaluation with insulin.    Per review of note from [place], patient's daughter/caregiver, Shannon [name], has declined [facility] RN intervention.   Successful phone outreach to patient's daughter Shannon [name], HIPAA details verified.   Shannon [name] confirms patient has prescription drug coverage through a federal employee retiree plan.  Discussed unsure if patient would be eligible for manufacturer patient assistance given this type of prescription insurance.  She reports they have looked into it and were told patient doesn't qualify due to insurance type.   Unfortunately, there are likely not many options for lowering cost of patient's insulin.  Suggested they check and see if patient's prescription coverage offers any savings for medication through mail order.    Patient's daughter states she needed to go due to patient having appointment, offered to call back later, patient's daughter declined offer.   Plan:  Will close case as patient's daughter prefers not to have a call back and patient assistance options for medications seem limited.   Karrie Meres, PharmD, [place] [phone]

## 2016-11-13 ENCOUNTER — Other Ambulatory Visit (HOSPITAL_COMMUNITY): Payer: Self-pay | Admitting: Interventional Radiology

## 2016-11-13 ENCOUNTER — Telehealth: Payer: Self-pay | Admitting: Cardiology

## 2016-11-13 DIAGNOSIS — R633 Feeding difficulties, unspecified: Secondary | ICD-10-CM

## 2016-11-13 MED ORDER — FUROSEMIDE 40 MG PO TABS: 40.0000 mg | ORAL_TABLET | Freq: Every day | ORAL | 11 refills | Status: DC

## 2016-11-13 NOTE — Telephone Encounter (Signed)
Pt c/o BP issue: STAT if pt c/o blurred vision, one-sided weakness or slurred speech  1. What are your last 5 BP readings? 154/70 pulse rate 65  2. Are you having any other symptoms (ex. Dizziness, headache, blurred vision, passed out)? Patient is beginning to gain weight (slowly)   3. What is your BP issue? Daughter states that patient's BP has been high.  Patient went to ED last week and had a f/u visit with PCP. PCP increased her carvedilol.

## 2016-11-13 NOTE — Telephone Encounter (Signed)
Patient's daughter is calling about patient's BP being high. She stated patient's BP's have been high in the mornings, SBP 202 to 190 and DBP 90's to 70's.  Patient's HR 60 to 77. Patient's daughter stated PCP increase coreg to 6.25 mg by mouth BID and took patient off of metoprolol that was started when patient went to ED for elevated BP. Patient also complaining of left leg swelling and a slight weight increase 5 lbs in the last week and a half. Made patient an appointment with [facility]. Will forward message to Dr. Meda Coffee for advisement.

## 2016-11-13 NOTE — Telephone Encounter (Signed)
Called patient's daughter back. Informed her of Dr. Francesca Oman recommendations. Sent prescription in for lasix 40 mg by mouth daily. Tired to find patient a sooner appointment. No openings at this time. Will leave appointment with HTN clinic on Wednesday, and will call if anything becomes available. Patient's daughter verbalized understanding.

## 2016-11-13 NOTE — Telephone Encounter (Signed)
I would start lasix 40 mg po daily and schedule an earlier appointment with a PA or with a pharmacist later this week.

## 2016-11-14 ENCOUNTER — Ambulatory Visit (HOSPITAL_COMMUNITY)
Admission: RE | Admit: 2016-11-14 | Discharge: 2016-11-14 | Disposition: A | Discharge status: Home / Self Care | Payer: Medicare Other | Source: Ambulatory Visit | Attending: Interventional Radiology | Admitting: Interventional Radiology

## 2016-11-14 ENCOUNTER — Other Ambulatory Visit (HOSPITAL_COMMUNITY): Payer: Self-pay | Admitting: Interventional Radiology

## 2016-11-14 DIAGNOSIS — T85848A Pain due to other internal prosthetic devices, implants and grafts, initial encounter: Secondary | ICD-10-CM | POA: Diagnosis not present

## 2016-11-14 DIAGNOSIS — K9423 Gastrostomy malfunction: Secondary | ICD-10-CM | POA: Diagnosis not present

## 2016-11-14 DIAGNOSIS — R633 Feeding difficulties, unspecified: Secondary | ICD-10-CM

## 2016-11-14 DIAGNOSIS — Y733 Surgical instruments, materials and gastroenterology and urology devices (including sutures) associated with adverse incidents: Secondary | ICD-10-CM | POA: Diagnosis not present

## 2016-11-14 HISTORY — PX: IR REPLC DUODEN/JEJUNO TUBE PERCUT W/FLUORO: IMG2334

## 2016-11-14 MED ORDER — IOPAMIDOL (ISOVUE-300) INJECTION 61%
INTRAVENOUS | Status: DC | PRN
  Administered 2016-11-14: 10 mL via ORAL

## 2016-11-14 MED ORDER — IOPAMIDOL (ISOVUE-300) INJECTION 61%
INTRAVENOUS | Status: AC
  Filled 2016-11-14: qty 50

## 2016-11-14 MED ORDER — IOPAMIDOL (ISOVUE-300) INJECTION 61%: 50.0000 mL | Freq: Once | INTRAVENOUS | Status: DC | PRN

## 2016-11-14 NOTE — Telephone Encounter (Signed)
Pt is scheduled for HTN clinic to see our Pharmacist, on 8/22 at 0930.  Pt and Daughter made aware of appt date and time by [facility] scheduling.

## 2016-11-15 ENCOUNTER — Telehealth: Payer: Self-pay | Admitting: Cardiology

## 2016-11-15 ENCOUNTER — Encounter (HOSPITAL_COMMUNITY): Payer: Self-pay | Admitting: Interventional Radiology

## 2016-11-15 ENCOUNTER — Ambulatory Visit (INDEPENDENT_AMBULATORY_CARE_PROVIDER_SITE_OTHER): Payer: Medicare Other | Admitting: Nurse Practitioner

## 2016-11-15 ENCOUNTER — Other Ambulatory Visit: Payer: Self-pay | Admitting: Internal Medicine

## 2016-11-15 VITALS — BP 168/84 | HR 67 | Temp 97.5°F | Resp 18 | Ht 66.0 in | Wt 154.0 lb

## 2016-11-15 DIAGNOSIS — K922 Gastrointestinal hemorrhage, unspecified: Secondary | ICD-10-CM | POA: Diagnosis not present

## 2016-11-15 DIAGNOSIS — Z7901 Long term (current) use of anticoagulants: Secondary | ICD-10-CM | POA: Diagnosis not present

## 2016-11-15 DIAGNOSIS — Z931 Gastrostomy status: Secondary | ICD-10-CM | POA: Diagnosis not present

## 2016-11-15 DIAGNOSIS — I70262 Atherosclerosis of native arteries of extremities with gangrene, left leg: Secondary | ICD-10-CM | POA: Diagnosis not present

## 2016-11-15 DIAGNOSIS — I480 Paroxysmal atrial fibrillation: Secondary | ICD-10-CM | POA: Diagnosis not present

## 2016-11-15 LAB — CBC WITH DIFFERENTIAL/PLATELET
Basophils Absolute: 55 cells/uL (ref 0–200)
Basophils Relative: 1 %
Eosinophils Absolute: 1045 cells/uL — ABNORMAL HIGH (ref 15–500)
Eosinophils Relative: 19 %
HCT: 33.8 % — ABNORMAL LOW (ref 35.0–45.0)
Hemoglobin: 10.9 g/dL — ABNORMAL LOW (ref 11.7–15.5)
Lymphocytes Relative: 22 %
Lymphs Abs: 1210 cells/uL (ref 850–3900)
MCH: 29 pg (ref 27.0–33.0)
MCHC: 32.2 g/dL (ref 32.0–36.0)
MCV: 89.9 fL (ref 80.0–100.0)
MPV: 10.5 fL (ref 7.5–12.5)
Monocytes Absolute: 770 cells/uL (ref 200–950)
Monocytes Relative: 14 %
Neutro Abs: 2420 cells/uL (ref 1500–7800)
Neutrophils Relative %: 44 %
Platelets: 242 10*3/uL (ref 140–400)
RBC: 3.76 MIL/uL — ABNORMAL LOW (ref 3.80–5.10)
RDW: 18.8 % — ABNORMAL HIGH (ref 11.0–15.0)
WBC: 5.5 10*3/uL (ref 3.8–10.8)

## 2016-11-15 LAB — POCT INR: INR: 1.7

## 2016-11-15 MED ORDER — PANTOPRAZOLE SODIUM 40 MG PO TBEC: 40.0000 mg | DELAYED_RELEASE_TABLET | Freq: Two times a day (BID) | ORAL | 1 refills | Status: DC

## 2016-11-15 MED ORDER — FERROUS SULFATE 325 (65 FE) MG PO TABS: 325.0000 mg | ORAL_TABLET | Freq: Every day | ORAL | 0 refills | Status: DC

## 2016-11-15 NOTE — Telephone Encounter (Signed)
Pts daughter is calling to confirm lasix dosage change made by Dr Meda Coffee on 8/13.  Daughter wanted to confirm that Dr Meda Coffee wants the pt to take lasix 40 mg po daily.  Informed the pts Daughter that this is correct.  Per the Daughter, the pharmacy filled her lasix for 20 mg po daily. Informed the pts daughter that this was incorrectly filled by the pharmacy, and Dr Meda Coffee called in the 40 mg tabs of lasix for the pt, and she should be taking lasix 40 mg po daily.  Informed the daughter that I will call the pharmacy now, and have them correct this and fill for the 40 mg tabs not her old regimen of 20 mg tabs.   Advised the pts daughter that she should give the pt 2 tabs of her 20 mg tablets of lasix today, to equal 40 mg po daily. Daughter verbalized understanding and agrees with this plan.   Spoke with [facility] pharmacy and informed the pharmacist of them filling the pts lasix incorrectly.  Informed the Pharmacist that the prescription sent to them to fill per Dr Meda Coffee, was lasix 40 mg po daily.  Per the pharmacist, they accidentally filled the pts old dose of lasix and will correct this in the system.  Per the Pharmacist, he advised that the pt should take 2 of her 20 mg tabs of lasix to get 40 mg po daily, and call back when this supply exhaust in 2 weeks.  Pharmacist states this is because the pts insurance will not cover the 40's, since the daughter just pick up the 20 mg tabs.  Informed the pts Daughter of this recommendation and she verbalized understanding and agrees with this plan.  Daughter gracious for all the assistance provided.

## 2016-11-15 NOTE — Telephone Encounter (Signed)
New Message   Pt c/o medication issue:  1. Name of Medication: furosemide (LASIX) 40 MG tablet  2. How are you currently taking this medication (dosage and times per day)? Hasn't started taking yet  3. Are you having a reaction (difficulty breathing--STAT)? No  4. What is your medication issue? Per pt daughter when she went to pick up prescription it was a different dosage that what the nurse told her. Requesting call back.

## 2016-11-15 NOTE — Progress Notes (Signed)
Careteam: Patient Care Team: Gildardo Cranker, DO as PCP - General (Internal Medicine) Riccardo Dubin, MD as Referring Physician (Cardiology) Edrick Kins, DPM as Consulting Physician (Podiatry)  Advanced Directive information Does Patient Have a Medical Advance Directive?: Yes, Type of Advance Directive: Healthcare Power of Attorney  Allergies  Allergen Reactions  . Penicillins Itching, Rash and Other (See Comments)    Tolerated unasyn, zosyn & cephalosporins in the past.  Has patient had a PCN reaction causing immediate rash, facial/tongue/throat swelling, SOB or lightheadedness with hypotension: Yes Has patient had a PCN reaction causing severe rash involving mucus membranes or skin necrosis: Unk Has patient had a PCN reaction that required hospitalization: Unk Has patient had a PCN reaction occurring within the last 10 years: Unk If all of the above answers are "NO", then may proceed with Cephalosporins  . Other     Patient tolerates amoxicillin and cephalosporins    Chief Complaint  Patient presents with  . Acute Visit    Pt is being seen due to bleeding around feeding tube x 1 week. Radiologist stated that tube was in place so bleeding was coming from stomach.   . Other    Daughter in room     HPI: Patient is a 83 y.o. female seen in the office today due to bleeding around PEG site.  Went to IR yesterday after adjustments were made to tube, it felt funny and uncomfortable so she went in to have them evaluate it.  Daughter noted bleeding around site when changing dressing and IR felt like this was coming from her stomach. Sometimes she reports dark stools. Currently on coumadin due to a fib and plavix due to PAD  s/p left SFA stent 09/21/16 due to PAD. ABI on 09/24/16 showed right 0.86; left 0.96. followed by vascular sx. Denies increase in weakness, lightheadedness, palpitations, shortness of breath.  Review of Systems:  Review of Systems  Constitutional:  Negative for chills, fever and malaise/fatigue.  HENT: Negative for tinnitus.   Eyes: Negative for blurred vision and double vision.  Respiratory: Negative for cough, shortness of breath and wheezing.   Cardiovascular: Negative for chest pain, palpitations and leg swelling.  Gastrointestinal: Positive for abdominal pain (chronic). Negative for constipation, diarrhea, heartburn, nausea and vomiting.       Blood around PEG site and occasional dark stools.   Skin: Negative for rash.  Neurological: Negative for focal weakness and weakness.    Past Medical History:  Diagnosis Date  . Acute respiratory failure ([place])   . Anemia    previous blood transfusions  . Arthritis    "all over"  . Asthma   . Bradycardia    requiring previous d/c of BB and reduction of amiodarone  . CAD (coronary artery disease)    nonobstructive per notes  . Chronic diastolic CHF (congestive heart failure) ([place])   . CKD (chronic kidney disease), stage III   . Complication of blood transfusion    "got the wrong blood type at [facility] in ~ 2015; no adverse reaction that we are aware of"/daughter, [name] (01/27/2016)  . COPD (chronic obstructive pulmonary disease) ([place])   . Depression    "light case"  . DVT (deep venous thrombosis) ([employer]) 01/2016   a. LLE DVT 01/2016 - switched from Eliquis to Coumadin.  . Gastric stenosis    a. s/p stomach tube  . GERD (gastroesophageal reflux disease)   . History of blood transfusion    "several" (01/27/2016)  .  History of stomach ulcers   . Hyperlipidemia   . Hypertension   . Hypothyroidism   . Paraesophageal hernia   . Perforated gastric ulcer ([place])   . Seasonal allergies   . SIADH (syndrome of inappropriate ADH production) ([place])    [name] 01/10/2015  . Small bowel obstruction ([place])    "I don't know how many" (01/11/2015)  . Stroke ([place])    "light one"  . Type II diabetes mellitus ([place])    "related to prednisone use  for > 20 yr; once predinose stopped; no more DM  RX" (01/27/2016)  . UTI (urinary tract infection) 02/08/2016  . Ventral hernia with bowel obstruction    Past Surgical History:  Procedure Laterality Date  . ABDOMINAL AORTOGRAM N/A 09/21/2016   Procedure: Abdominal Aortogram;  Surgeon: Waynetta Sandy, MD;  Location: [facility];  Service: Cardiovascular;  Laterality: N/A;  . AMPUTATION Left 09/25/2016   Procedure: AMPUTATION DIGIT- LEFT 2ND AND 3RD TOES;  Surgeon: Rosetta Posner, MD;  Location: [facility];  Service: Vascular;  Laterality: Left;  . CATARACT EXTRACTION W/ INTRAOCULAR LENS  IMPLANT, BILATERAL    . CHOLECYSTECTOMY OPEN    . COLECTOMY    . ESOPHAGOGASTRODUODENOSCOPY N/A 01/19/2014   Procedure: ESOPHAGOGASTRODUODENOSCOPY (EGD);  Surgeon: Irene Shipper, MD;  Location: [facility];  Service: Endoscopy;  Laterality: N/A;  . ESOPHAGOGASTRODUODENOSCOPY N/A 01/20/2014   Procedure: ESOPHAGOGASTRODUODENOSCOPY (EGD);  Surgeon: Irene Shipper, MD;  Location: [facility];  Service: Endoscopy;  Laterality: N/A;  . ESOPHAGOGASTRODUODENOSCOPY N/A 03/19/2014   Procedure: ESOPHAGOGASTRODUODENOSCOPY (EGD);  Surgeon: Milus Banister, MD;  Location: [facility];  Service: Endoscopy;  Laterality: N/A;  . ESOPHAGOGASTRODUODENOSCOPY N/A 07/08/2015   Procedure: ESOPHAGOGASTRODUODENOSCOPY (EGD);  Surgeon: Doran Stabler, MD;  Location: [facility];  Service: Endoscopy;  Laterality: N/A;  . ESOPHAGOGASTRODUODENOSCOPY (EGD) WITH PROPOFOL N/A 09/15/2015   Procedure: ESOPHAGOGASTRODUODENOSCOPY (EGD) WITH PROPOFOL;  Surgeon: Manus Gunning, MD;  Location: [facility];  Service: Gastroenterology;  Laterality: N/A;  . GASTROJEJUNOSTOMY     hx/notes 01/10/2015  . GASTROJEJUNOSTOMY N/A 09/23/2015   Procedure: OPEN GASTROJEJUNOSTOMY TUBE PLACEMENT;  Surgeon: Arta Bruce Kinsinger, MD;  Location: [facility];  Service: General;  Laterality: N/A;  . GLAUCOMA SURGERY Bilateral   . HERNIA REPAIR  2015  . IR GENERIC HISTORICAL  01/07/2016   IR GJ TUBE  CHANGE 01/07/2016 Jacqulynn Cadet, MD [facility]  . IR GENERIC HISTORICAL  01/27/2016   IR MECH REMOV OBSTRUC MAT ANY COLON TUBE W/FLUORO 01/27/2016 Markus Daft, MD [facility]  . IR GENERIC HISTORICAL  02/07/2016   IR PATIENT EVAL TECH 0-60 MINS Darrell K Allred, PA-C [facility]  . IR GENERIC HISTORICAL  02/08/2016   IR GJ TUBE CHANGE 02/08/2016 Greggory Keen, MD [facility]  . IR GENERIC HISTORICAL  01/06/2016   IR GJ TUBE CHANGE 01/06/2016 [facility] OUT REF  . IR GENERIC HISTORICAL  05/02/2016   IR CM INJ ANY COLONIC TUBE W/FLUORO 05/02/2016 Arne Cleveland, MD [facility]  . IR GENERIC HISTORICAL  05/15/2016   IR GJ TUBE CHANGE 05/15/2016 Sandi Mariscal, MD [facility]  . IR GENERIC HISTORICAL  06/28/2016   IR GJ TUBE CHANGE 06/28/2016 [facility]  . IR PATIENT EVAL TECH 0-60 MINS  10/19/2016  . IR REPLC DUODEN/JEJUNO TUBE PERCUT W/FLUORO  11/14/2016  . LAPAROTOMY N/A 01/20/2015   Procedure: EXPLORATORY LAPAROTOMY;  Surgeon: Coralie Keens, MD;  Location: [facility];  Service: General;  Laterality: N/A;  . LOWER EXTREMITY ANGIOGRAPHY  Left 09/21/2016   Procedure: Lower Extremity Angiography;  Surgeon: Waynetta Sandy, MD;  Location: [facility];  Service: Cardiovascular;  Laterality: Left;  . LYSIS OF ADHESION N/A 01/20/2015   Procedure: LYSIS OF ADHESIONS < 1 HOUR;  Surgeon: Coralie Keens, MD;  Location: [facility];  Service: General;  Laterality: N/A;  . PERIPHERAL VASCULAR BALLOON ANGIOPLASTY Left 09/21/2016   Procedure: Peripheral Vascular Balloon Angioplasty;  Surgeon: Waynetta Sandy, MD;  Location: [facility];  Service: Cardiovascular;  Laterality: Left;  drug coated balloon  . TONSILLECTOMY    . TUBAL LIGATION    . VENTRAL HERNIA REPAIR  2015   incarcerated ventral hernia ([facility] 09/2013)/notes 01/10/2015   Social History:   reports that she has never smoked. She has quit using smokeless tobacco. Her smokeless tobacco use included Snuff. She reports that she  does not drink alcohol or use drugs.  Family History  Problem Relation Age of Onset  . Stroke Mother   . Hypertension Mother   . Diabetes Brother   . Heart attack Neg Hx     Medications: Patient's Medications  New Prescriptions   No medications on file  Previous Medications   ACETAMINOPHEN (TYLENOL) 500 MG TABLET    Take 1,000 mg by mouth every 6 (six) hours as needed (pain).   AMINO ACIDS-PROTEIN HYDROLYS PO    Take 30 mLs by mouth 2 (two) times daily.   ATORVASTATIN (LIPITOR) 10 MG TABLET    Take 10 mg by mouth. Take one tablet daily for cholesterol   B COMPLEX VITAMINS TABLET    Take 1 tablet by mouth daily.    BENZONATATE (TESSALON PERLES) 100 MG CAPSULE    Take two capsules by mouth three times daily as needed for cough   CALCIUM-VITAMIN D (OSCAL WITH D) 500-200 MG-UNIT PER TABLET    Take 1 tablet by mouth daily with breakfast.    CARVEDILOL (COREG) 6.25 MG TABLET    Take 2 tabs po in AM and 1 tab po in PM for blood pressure   CLOPIDOGREL (PLAVIX) 75 MG TABLET    TAKE 1 TABLET (75 MG TOTAL) BY MOUTH DAILY WITH BREAKFAST.   FEEDING SUPPLEMENT (OSMOLITE 1 CAL) LIQD    Take 711 mLs by mouth at bedtime.    FLUTICASONE (FLONASE) 50 MCG/ACT NASAL SPRAY    Place 2 sprays into both nostrils daily.   FUROSEMIDE (LASIX) 40 MG TABLET    Take 1 tablet (40 mg total) by mouth daily.   GABAPENTIN (NEURONTIN) 300 MG CAPSULE    Take 600 mg by mouth 3 (three) times daily.   HYDRALAZINE (APRESOLINE) 25 MG TABLET    Take 1 tablet (25 mg total) by mouth 3 (three) times daily.   INSULIN GLARGINE (LANTUS) 100 UNIT/ML SOLOSTAR PEN    Injection 0.1 ml (10 units) once daily to control blood sugar. Dx E11.9   IPRATROPIUM-ALBUTEROL (DUONEB) 0.5-2.5 (3) MG/3ML SOLN    Take 3 mLs by nebulization every 6 (six) hours as needed (sob). Use 3 times daily x 4 days then every 6 hours as needed.   ISOSORBIDE MONONITRATE (IMDUR) 30 MG 24 HR TABLET    Take 30 mg by mouth daily.   LATANOPROST (XALATAN) 0.005 % OPHTHALMIC  SOLUTION    Place 1 drop into both eyes at bedtime.   LEVALBUTEROL (XOPENEX HFA) 45 MCG/ACT INHALER    Inhale 2 puffs into the lungs every 6 (six) hours as needed for wheezing or shortness of breath.  LEVOTHYROXINE (SYNTHROID, LEVOTHROID) 137 MCG TABLET    TAKE 1 TABLET (137 MCG TOTAL) BY MOUTH DAILY BEFORE BREAKFAST.   MALTODEXTRIN-XANTHAN GUM (RESOURCE THICKENUP CLEAR) POWD    Take 120 g by mouth as needed (use to thicken liquids.).   MOMETASONE-FORMOTEROL (DULERA) 200-5 MCG/ACT AERO    Inhale 2 puffs into the lungs 2 (two) times daily.   MULTIPLE VITAMIN (MULTIVITAMIN WITH MINERALS) TABS TABLET    Take 1 tablet by mouth daily.   NYSTATIN (MYCOSTATIN/NYSTOP) POWDER    Apply topically 3 (three) times daily.   POLYETHYL GLYCOL-PROPYL GLYCOL 0.4-0.3 % SOLN    Place 1 drop into both eyes 4 (four) times daily.    RESTASIS 0.05 % OPHTHALMIC EMULSION    USE 1 DROP INTO BOTH EYES TWICE DAILY   SERTRALINE (ZOLOFT) 100 MG TABLET    Take 100 mg by mouth. Take one tablet each morning for depression   WARFARIN (COUMADIN) 2.5 MG TABLET    TAKE AS DIRECTED BY COUMADIN CLINIC.   WATER FOR IRRIGATION, STERILE (FREE WATER) SOLN    Place 230 mLs into feeding tube 3 (three) times daily.  Modified Medications   Modified Medication Previous Medication   NOVOLOG FLEXPEN 100 UNIT/ML FLEXPEN insulin aspart (NOVOLOG FLEXPEN) 100 UNIT/ML FlexPen      BEFORE EACH MEAL 3 TIMES DAILY,140-199-2UNITS,200-250-6 UNITS,300-349-8 UNITS, 350 OR ABOVE 10 UNITS    Before each meal 3 times a day, 140-199 - 2 units, 200-250 - 4 units, 251-299 - 6 units,  300-349 - 8 units,  350 or above 10 units. Insulin PEN if approved, provide syringes and needles if needed.  Discontinued Medications   No medications on file     Physical Exam:  Vitals:   11/15/16 1317  BP: (!) 168/84  Pulse: 67  Resp: 18  Temp: (!) 97.5 F (36.4 C)  TempSrc: Oral  SpO2: 97%  Weight: 154 lb (69.9 kg)  Height: 5\' 6"  (1.676 m)   Body mass index is  24.86 kg/m.  Physical Exam  Constitutional: She is oriented to person, place, and time. She appears well-developed and well-nourished.  Frail appearing in NAD  HENT:  Mouth/Throat: Oropharynx is clear and moist. No oropharyngeal exudate.  MMM  Eyes: Pupils are equal, round, and reactive to light. No scleral icterus.  Neck: Neck supple. Carotid bruit is not present. No tracheal deviation present.  Cardiovascular: Normal rate, regular rhythm and intact distal pulses.  Exam reveals no gallop and no friction rub.   Murmur (1/6 SEM) heard. Pulmonary/Chest: Effort normal and breath sounds normal. No stridor. No respiratory distress. She has no wheezes. She has no rales.  Abdominal: Soft. Normal appearance and bowel sounds are normal. She exhibits no distension and no mass. There is no hepatomegaly. There is tenderness. There is no rigidity, no rebound and no guarding. A hernia is present. Hernia confirmed positive in the ventral area.  Peg site has blood around it but no redness, moderate amt of blood on drainage sponge.   Musculoskeletal: She exhibits edema and tenderness.  Wearing boot to left foot  Lymphadenopathy:    She has no cervical adenopathy.  Neurological: She is alert and oriented to person, place, and time.  Skin: Skin is warm and dry. No rash noted.  Psychiatric: She has a normal mood and affect. Her behavior is normal. Thought content normal.   Labs reviewed: Basic Metabolic Panel:  Recent Labs  06/08/16 1523  07/21/16 1124  08/15/16 0545 08/16/16 0616  08/18/16 0517  09/25/16  1191 09/26/16 0435 10/03/16 1610 11/02/16 1710  NA 135  < >  --   < > 136 141  < > 135  < > 136 133* 134* 133*  K 4.4  < >  --   < > 3.3* 4.2  < > 4.0  < > 4.6 4.4 4.7 4.4  CL 99*  < >  --   < > 102 107  < > 101  < > 105 103 97* 98*  CO2 29  < >  --   < > 23 25  < > 24  < > 25 25 27 29   GLUCOSE 148*  < >  --   < > 451* 350*  < > 154*  < > 106* 232* 102* 110*  BUN 54*  < >  --   < > 64* 54*  <  > 49*  < > 32* 35* 32* 32*  CREATININE 1.97*  < >  --   < > 1.11* 1.04*  < > 0.96  < > 1.03* 1.22* 1.10* 1.01*  CALCIUM 8.7*  < >  --   < > 9.3 9.4  < > 9.1  < > 8.7* 8.3* 9.1 9.7  MG 1.9  < >  --   < > 2.1 1.9  --  1.9  --   --   --   --   --   PHOS  --   < >  --   < > 2.4* 2.2*  --   --   --  3.5  --   --   --   TSH 10.017*  --  4.97*  --   --   --   --  0.670  --   --   --   --   --   < > = values in this interval not displayed. Liver Function Tests:  Recent Labs  09/26/16 0435 10/03/16 1610 11/02/16 1710  AST 25 20 24   ALT 31 25 19   ALKPHOS 57 73 58  BILITOT 0.4 0.4 0.5  PROT 5.4* 7.1 7.0  ALBUMIN 2.0* 3.2* 3.5   No results for input(s): LIPASE, AMYLASE in the last 8760 hours. No results for input(s): AMMONIA in the last 8760 hours. CBC:  Recent Labs  09/19/16 1104  09/26/16 0435 10/03/16 1610 11/02/16 1710  WBC 18.1*  < > 11.4* 5.8 8.0  NEUTROABS 15.9*  --  8.5* 3,190  --   HGB 9.8*  < > 8.0* 10.5* 11.1*  HCT 30.4*  < > 25.1* 33.2* 32.8*  MCV 88.4  < > 89.6 88.5 83.9  PLT 361  < > 263 358 231  < > = values in this interval not displayed. Lipid Panel: No results for input(s): CHOL, HDL, LDLCALC, TRIG, CHOLHDL, LDLDIRECT in the last 8760 hours. TSH:  Recent Labs  06/08/16 1523 07/21/16 1124 08/18/16 0517  TSH 10.017* 4.97* 0.670   A1C: Lab Results  Component Value Date   HGBA1C 7.7 (H) 08/16/2016     Assessment/Plan 1. Paroxysmal atrial fibrillation (HCC) Rate controlled. conts on coumadin for anticoagulation.  2. PEG (percutaneous endoscopic gastrostomy) status ([place]) -PEG exchanged yesterday by IR and gastric bleeding could not be excluded.   3. Gastrointestinal hemorrhage, unspecified gastrointestinal hemorrhage type Bleeding noted around PEG sight, per IR note possible that the loop of catheter present in the stomach was causing some gastric Irritation but gastric bleed cannot be excluded  -stool positive for occult blood.  - CBC with  Differential/Platelets to evaluate hgb -will start protonix 40 mg PO BID at this time along with ferrous sulfate 325 mg daily  4. Warfarin anticoagulation - POC INR 1.7  Next appt: with Dr Eulas Post in 3 weeks.  Discussed findings and exam with Dr Eulas Post. Will get CBC to evaluate further and follow up as scheduled. Educated daughter abt bleeding and risk of more extensive bleeding due to plavix and coumadin however due to pts complex medication hx with PAD, a fib and other co-morbidities she is at high risk for clotting if these are stopped.   [name]. Harle Battiest  [facility] [phone] 8 am - 5 pm) [phone] (after hours)

## 2016-11-22 ENCOUNTER — Encounter: Payer: Self-pay | Admitting: Pharmacist

## 2016-11-22 ENCOUNTER — Ambulatory Visit (INDEPENDENT_AMBULATORY_CARE_PROVIDER_SITE_OTHER): Payer: Medicare Other | Admitting: Pharmacist

## 2016-11-22 VITALS — BP 164/78 | HR 65

## 2016-11-22 DIAGNOSIS — I10 Essential (primary) hypertension: Secondary | ICD-10-CM | POA: Diagnosis not present

## 2016-11-22 DIAGNOSIS — I4892 Unspecified atrial flutter: Secondary | ICD-10-CM | POA: Diagnosis not present

## 2016-11-22 DIAGNOSIS — Z5181 Encounter for therapeutic drug level monitoring: Secondary | ICD-10-CM | POA: Diagnosis not present

## 2016-11-22 DIAGNOSIS — I70262 Atherosclerosis of native arteries of extremities with gangrene, left leg: Secondary | ICD-10-CM

## 2016-11-22 LAB — POCT INR: INR: 1.9

## 2016-11-22 NOTE — Patient Instructions (Addendum)
It was good seeing you today.  Your blood pressure is above your goal today.   To give you more blood pressure control in the mornings, take hydralazine 25 mg by mouth 2 tablets in the mornings, 1 tablet in the afternoon, and 1 tablet in the evenings.  Continue all your other blood pressure medications as prescribed.  If you notice your blood pressure increase or decrease persistently or if you experience increased dizziness, lightheadedness, or weakness, contact the clinic at [phone].  Continue to check your blood pressure at home and keep a log of the readings.  Follow up in clinic in 4 weeks on 9/19 at 10:30 AM for blood pressure evaluation.

## 2016-11-22 NOTE — Progress Notes (Signed)
Patient ID: Shannon [name]                 DOB: 03-19-1924                      MRN: [id number]     HPI: Shannon [name] is a 83 y.o. female patient of Dr. Delton See who presents today for hypertension evaluation.  PMH includes stage III CKD, COPD, HLD, HTN, afib, chronic diastolic HF (EF 60-65%). Pt was seen by Dr. Montez Morita in office on 8/3 for hypertension related headaches following an ED visit on 8/2 after a home BP reading of 209/93. Her lasix was stopped, and she was discharged with metoprolol tartrate 25 mg. At the office visit, BP was 146/72. Coreg was increased to 6.25 mg BID and metoprolol tartrate was discontinued. Pt's daughter called into clinic on 8/13 reporting pt's BP high in the mornings 190-202/70s-90s and HR 60-70. Pt also complained of left leg swelling and weight gain of 5 lbs in the last week and a half. Lasix 40 mg daily was started by Dr. Delton See.   Pt presents today for f/u in good spirits. She states that swelling has reduced and weight has stabilized since starting lasix 40 mg daily. She reports feeling some dizziness and lightheadedness overall, hasn't gotten worse or better since medication changes. Says she has felt a little off balanced since being discharged from the ED on 8/2. She denies weakness/fatigue or recent falls. Pt takes BP with home arm cuff about 1 hour after taking her medications and again around 9 PM. Her home readings in the mornings tend to be higher compared to her readings in the evenings. Morning readings fluctuate between 150s-180s/60s-70s. Evening readings fluctuate between 120s-160s/60s-70s. She has not had any BP medications prior to this visit. HR in clinic is 65, and BP is 164/78 mmHg.  Current HTN meds:  Carvedilol 6.25mg  2 tablets (12.5mg ) in the mornings and 1 tablet (6.25mg ) in the evenings (10 AM and 6 PM) Lasix 40mg  daily (10 AM) Hydralazine 25mg  TID (10 AM, 6 PM, 9 PM) Imdur 30 mg daily (10 AM)  Previously tried: Losartan 100 mg daily, lisinopril 10  mg daily, amlodipine 10 mg daily - swelling  BP goal: <150/90 mmHg  Family History: Mother - Stroke, HTN. Brother - Diabetes.   Social History: Reports that she has never smoked. Has quit using smokeless tobacco. Denies alcohol or illicit drug use.   Diet: Liquid diet, feeding tube. No coffee. Has diet ginger ale, some diet Coke.   Exercise: Light walking a few times a week, varies.   Home BP readings: Morning readings fluctuate between 150s-180s/60s-70s. Evening readings fluctuate between 120s-160s/60s-70s.    Wt Readings from Last 3 Encounters:  11/15/16 154 lb (69.9 kg)  11/03/16 149 lb (67.6 kg)  11/02/16 145 lb (65.8 kg)   BP Readings from Last 3 Encounters:  11/22/16 (!) 164/78  11/15/16 (!) 168/84  11/03/16 (!) 146/72   Pulse Readings from Last 3 Encounters:  11/22/16 65  11/15/16 67  11/03/16 70    Renal function: Estimated Creatinine Clearance: 34 mL/min (A) (by C-G formula based on SCr of 1.01 mg/dL (H)).  Past Medical History:  Diagnosis Date  . Acute respiratory failure (HCC)   . Anemia    previous blood transfusions  . Arthritis    "all over"  . Asthma   . Bradycardia    requiring previous d/c of BB and reduction of amiodarone  . CAD (coronary artery  disease)    nonobstructive per notes  . Chronic diastolic CHF (congestive heart failure) (HCC)   . CKD (chronic kidney disease), stage III   . Complication of blood transfusion    "got the wrong blood type at [facility] in ~ 2015; no adverse reaction that we are aware of"/daughter, Shannon [name] (01/27/2016)  . COPD (chronic obstructive pulmonary disease) (HCC)   . Depression    "light case"  . DVT (deep venous thrombosis) (HCC) 01/2016   a. LLE DVT 01/2016 - switched from Eliquis to Coumadin.  . Gastric stenosis    a. s/p stomach tube  . GERD (gastroesophageal reflux disease)   . History of blood transfusion    "several" (01/27/2016)  . History of stomach ulcers   . Hyperlipidemia   . Hypertension   .  Hypothyroidism   . Paraesophageal hernia   . Perforated gastric ulcer (HCC)   . Seasonal allergies   . SIADH (syndrome of inappropriate ADH production) (HCC)    [name] 01/10/2015  . Small bowel obstruction (HCC)    "I don't know how many" (01/11/2015)  . Stroke (HCC)    "light one"  . Type II diabetes mellitus (HCC)    "related to prednisone use  for > 20 yr; once predinose stopped; no more DM RX" (01/27/2016)  . UTI (urinary tract infection) 02/08/2016  . Ventral hernia with bowel obstruction     Current Outpatient Prescriptions on File Prior to Visit  Medication Sig Dispense Refill  . acetaminophen (TYLENOL) 500 MG tablet Take 1,000 mg by mouth every 6 (six) hours as needed (pain).    . AMINO ACIDS-PROTEIN HYDROLYS PO Take 30 mLs by mouth 2 (two) times daily.    [name] atorvastatin (LIPITOR) 10 MG tablet Take 10 mg by mouth. Take one tablet daily for cholesterol    . b complex vitamins tablet Take 1 tablet by mouth daily.     . benzonatate (TESSALON PERLES) 100 MG capsule Take two capsules by mouth three times daily as needed for cough 80 capsule 1  . calcium-vitamin D (OSCAL WITH D) 500-200 MG-UNIT per tablet Take 1 tablet by mouth daily with breakfast.     . carvedilol (COREG) 6.25 MG tablet Take 2 tabs po in AM and 1 tab po in PM for blood pressure 90 tablet 6  . clopidogrel (PLAVIX) 75 MG tablet TAKE 1 TABLET (75 MG TOTAL) BY MOUTH DAILY WITH BREAKFAST. 90 tablet 1  . feeding supplement (OSMOLITE 1 CAL) LIQD Take 711 mLs by mouth at bedtime.     . ferrous sulfate 325 (65 FE) MG tablet Take 1 tablet (325 mg total) by mouth daily with breakfast. 30 tablet 0  . fluticasone (FLONASE) 50 MCG/ACT nasal spray Place 2 sprays into both nostrils daily. 16 g 3  . furosemide (LASIX) 40 MG tablet Take 1 tablet (40 mg total) by mouth daily. 30 tablet 11  . gabapentin (NEURONTIN) 300 MG capsule Take 600 mg by mouth 3 (three) times daily.    . Insulin Glargine (LANTUS) 100 UNIT/ML Solostar Pen  Injection 0.1 ml (10 units) once daily to control blood sugar. Dx E11.9 15 mL 5  . ipratropium-albuterol (DUONEB) 0.5-2.5 (3) MG/3ML SOLN Take 3 mLs by nebulization every 6 (six) hours as needed (sob). Use 3 times daily x 4 days then every 6 hours as needed. 360 mL 4  . isosorbide mononitrate (IMDUR) 30 MG 24 hr tablet Take 30 mg by mouth daily.    [name] latanoprost (XALATAN) 0.005 %  ophthalmic solution Place 1 drop into both eyes at bedtime. 2.5 mL 12  . levalbuterol (XOPENEX HFA) 45 MCG/ACT inhaler Inhale 2 puffs into the lungs every 6 (six) hours as needed for wheezing or shortness of breath. 1 Inhaler 12  . levothyroxine (SYNTHROID, LEVOTHROID) 137 MCG tablet TAKE 1 TABLET (137 MCG TOTAL) BY MOUTH DAILY BEFORE BREAKFAST. 30 tablet 2  . Maltodextrin-Xanthan Gum (RESOURCE THICKENUP CLEAR) POWD Take 120 g by mouth as needed (use to thicken liquids.). 20 Can 0  . mometasone-formoterol (DULERA) 200-5 MCG/ACT AERO Inhale 2 puffs into the lungs 2 (two) times daily. 13 g 6  . Multiple Vitamin (MULTIVITAMIN WITH MINERALS) TABS tablet Take 1 tablet by mouth daily.    [name] NOVOLOG FLEXPEN 100 UNIT/ML FlexPen BEFORE EACH MEAL 3 TIMES DAILY,140-199-2UNITS,200-250-6 UNITS,300-349-8 UNITS, 350 OR ABOVE 10 UNITS 15 pen 6  . nystatin (MYCOSTATIN/NYSTOP) powder Apply topically 3 (three) times daily. 15 g 0  . pantoprazole (PROTONIX) 40 MG tablet Take 1 tablet (40 mg total) by mouth 2 (two) times daily. 60 tablet 1  . Polyethyl Glycol-Propyl Glycol 0.4-0.3 % SOLN Place 1 drop into both eyes 4 (four) times daily.     . RESTASIS 0.05 % ophthalmic emulsion USE 1 DROP INTO BOTH EYES TWICE DAILY 60 mL 3  . sertraline (ZOLOFT) 100 MG tablet Take 100 mg by mouth. Take one tablet each morning for depression    . warfarin (COUMADIN) 2.5 MG tablet TAKE AS DIRECTED BY COUMADIN CLINIC. (Patient taking differently: 5MG  Mon/Thurs. 3.75MG  on all other days) 50 tablet 1  . Water For Irrigation, Sterile (FREE WATER) SOLN Place 230 mLs  into feeding tube 3 (three) times daily.     No current facility-administered medications on file prior to visit.     Allergies  Allergen Reactions  . Penicillins Itching, Rash and Other (See Comments)    Tolerated unasyn, zosyn & cephalosporins in the past.  Has patient had a PCN reaction causing immediate rash, facial/tongue/throat swelling, SOB or lightheadedness with hypotension: Yes Has patient had a PCN reaction causing severe rash involving mucus membranes or skin necrosis: Unk Has patient had a PCN reaction that required hospitalization: Unk Has patient had a PCN reaction occurring within the last 10 years: Unk If all of the above answers are "NO", then may proceed with Cephalosporins  . Other     Patient tolerates amoxicillin and cephalosporins    Blood pressure (!) 164/78, pulse 65.   Assessment/Plan: Hypertension: Pt's BP today is above her goal of <150/90 mmHg. Adjust hydralazine dose to 50 mg in the mornings, 25 mg in the afternoon, and 25 mg in the evenings to provide better BP control for higher morning readings. Continue all other antihypertensive medications as prescribed. F/u in clinic in 4 weeks.   -Durward Mallard, PharmD Student   Thank you, Freddie Apley. Cleatis Polka, PharmD  [facility]  11/22/2016 9:22 PM

## 2016-11-24 ENCOUNTER — Encounter: Payer: Self-pay | Admitting: Pharmacist

## 2016-11-30 ENCOUNTER — Other Ambulatory Visit: Payer: Self-pay | Admitting: Pharmacist

## 2016-11-30 ENCOUNTER — Other Ambulatory Visit: Payer: Self-pay | Admitting: Internal Medicine

## 2016-11-30 MED ORDER — HYDRALAZINE HCL 25 MG PO TABS: ORAL_TABLET | ORAL | 11 refills | Status: DC

## 2016-12-08 ENCOUNTER — Ambulatory Visit (INDEPENDENT_AMBULATORY_CARE_PROVIDER_SITE_OTHER): Payer: Medicare Other | Admitting: Internal Medicine

## 2016-12-08 ENCOUNTER — Encounter: Payer: Self-pay | Admitting: Internal Medicine

## 2016-12-08 ENCOUNTER — Ambulatory Visit (INDEPENDENT_AMBULATORY_CARE_PROVIDER_SITE_OTHER): Payer: Medicare Other | Admitting: *Deleted

## 2016-12-08 VITALS — BP 124/70 | HR 72 | Resp 12 | Ht 66.0 in | Wt 152.0 lb

## 2016-12-08 DIAGNOSIS — I4892 Unspecified atrial flutter: Secondary | ICD-10-CM | POA: Diagnosis not present

## 2016-12-08 DIAGNOSIS — G8929 Other chronic pain: Secondary | ICD-10-CM

## 2016-12-08 DIAGNOSIS — I10 Essential (primary) hypertension: Secondary | ICD-10-CM

## 2016-12-08 DIAGNOSIS — R1012 Left upper quadrant pain: Secondary | ICD-10-CM

## 2016-12-08 DIAGNOSIS — Z931 Gastrostomy status: Secondary | ICD-10-CM

## 2016-12-08 DIAGNOSIS — Z5181 Encounter for therapeutic drug level monitoring: Secondary | ICD-10-CM

## 2016-12-08 DIAGNOSIS — M542 Cervicalgia: Secondary | ICD-10-CM

## 2016-12-08 DIAGNOSIS — I70262 Atherosclerosis of native arteries of extremities with gangrene, left leg: Secondary | ICD-10-CM

## 2016-12-08 DIAGNOSIS — I82542 Chronic embolism and thrombosis of left tibial vein: Secondary | ICD-10-CM

## 2016-12-08 DIAGNOSIS — J302 Other seasonal allergic rhinitis: Secondary | ICD-10-CM | POA: Diagnosis not present

## 2016-12-08 LAB — POCT INR: INR: 1.8

## 2016-12-08 NOTE — Progress Notes (Addendum)
Patient ID: Shannon [name], female   DOB: 1923-06-18, 83 y.o.   MRN: [id number]    Location:  [facility] Place of Service: OFFICE  Chief Complaint  Patient presents with  . Medical Management of Chronic Issues    1 month follow-up, stomach is sore and raised area at feeding tube location. Patient with pain in the back of neck into head x a while (getting worse). Severe itching in both ears. Here with daughter Shannon [name]   . Medication Refill    No refills needed   . Immunizations    Flu Vaccine to be given today     HPI:  83 yo female seen today for f/u. She c/o itchy ears. No pain. No d/c. She takes claritin daily and flonase for seasonal allergy. She continues to have neck pain but does not take tramadol or norco due to c/a constipation. She instead takes tylenol. She has R>L abdominal pain. No N/V. No f/c. No constipation  HTN/afib/aflutter/hyperlipidemia/chronic dHF - BP improved on hydralazine, coreg and imdur. Cholesterol stable on lipitor. Rate controlled on coreg.  She takes coumadin for anticoagulation. INR Followed by cardio Dr Delton See. EF 60-65%  GOO - s/p GJ tube. FT changed last month by IR. Takes Protonix BID. Constipation stable on lactulose. She gets TF at night (osmolite 1.5 cal 237 ml or 3.3 cans per day with promod 3 oz per day @ 50ml.hr x 16hrs) and free water flushes (230 ml) BID. Drinks liquids po. Takes zofran prn. Albumin improved at 3.5  Hyponatremia/ hx SIADH- stable. she was taking salt tabs but noticed the entire tab in her stool intermittently and no longer takes it. Na 133  DM - previously diet controlled but now on insulin tx (novolog SSI and lantus qhs). A1c 7.7%. Takes gabapentin for neuropathy  Hx CVA - stable. Takes plavix and coumadin daily  Depression - mood stable on sertraline  Arthritis - uncontrolled joint pain. She has tramadol and norco at home but does not take them. She prefers tylenol.  Hypothyroidism - takes levothyroxine 137 mcg daily. TSH 0.67  Hx  Pressure sore/right 3rd toe osteomyelitis - completed 1 month of augmentin. 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 Followed by podiatry.   PAD - s/p left SFA stent 09/21/16. ABI on 09/24/16 showed right 0.86; left 0.96. followed by vascular sx.  COPD/asthma - stable on dulera, xopenex hfa, duonebs. No recent exacerbation. Followed by pulmonary.   Past Medical History:  Diagnosis Date  . Acute respiratory failure (HCC)   . Anemia    previous blood transfusions  . Arthritis    "all over"  . Asthma   . Bradycardia    requiring previous d/c of BB and reduction of amiodarone  . CAD (coronary artery disease)    nonobstructive per notes  . Chronic diastolic CHF (congestive heart failure) (HCC)   . CKD (chronic kidney disease), stage III   . Complication of blood transfusion    "got the wrong blood type at [facility] in ~ 2015; no adverse reaction that we are aware of"/daughter, Shannon [name] (01/27/2016)  . COPD (chronic obstructive pulmonary disease) (HCC)   . Depression    "light case"  . DVT (deep venous thrombosis) (HCC) 01/2016   a. LLE DVT 01/2016 - switched from Eliquis to Coumadin.  . Gastric stenosis    a. s/p stomach tube  . GERD (gastroesophageal reflux disease)   . History of blood transfusion    "several" (01/27/2016)  .  History of stomach ulcers   . Hyperlipidemia   . Hypertension   . Hypothyroidism   . Paraesophageal hernia   . Perforated gastric ulcer (HCC)   . Seasonal allergies   . SIADH (syndrome of inappropriate ADH production) (HCC)    Shannon [name] 01/10/2015  . Small bowel obstruction (HCC)    "I don't know how many" (01/11/2015)  . Stroke (HCC)    "light one"  . Type II diabetes mellitus (HCC)    "related to prednisone use  for > 20 yr; once predinose stopped; no more DM RX" (01/27/2016)  . UTI (urinary tract infection) 02/08/2016  . Ventral hernia with bowel obstruction     Past Surgical History:  Procedure Laterality Date  .  ABDOMINAL AORTOGRAM N/A 09/21/2016   Procedure: Abdominal Aortogram;  Surgeon: Maeola Harman, MD;  Location: [facility] INVASIVE CV LAB;  Service: Cardiovascular;  Laterality: N/A;  . AMPUTATION Left 09/25/2016   Procedure: AMPUTATION DIGIT- LEFT 2ND AND 3RD TOES;  Surgeon: Larina Earthly, MD;  Location: [facility] OR;  Service: Vascular;  Laterality: Left;  . CATARACT EXTRACTION W/ INTRAOCULAR LENS  IMPLANT, BILATERAL    . CHOLECYSTECTOMY OPEN    . COLECTOMY    . ESOPHAGOGASTRODUODENOSCOPY N/A 01/19/2014   Procedure: ESOPHAGOGASTRODUODENOSCOPY (EGD);  Surgeon: Hilarie Fredrickson, MD;  Location: [facility] ENDOSCOPY;  Service: Endoscopy;  Laterality: N/A;  . ESOPHAGOGASTRODUODENOSCOPY N/A 01/20/2014   Procedure: ESOPHAGOGASTRODUODENOSCOPY (EGD);  Surgeon: Hilarie Fredrickson, MD;  Location: [facility] ENDOSCOPY;  Service: Endoscopy;  Laterality: N/A;  . ESOPHAGOGASTRODUODENOSCOPY N/A 03/19/2014   Procedure: ESOPHAGOGASTRODUODENOSCOPY (EGD);  Surgeon: Rachael Fee, MD;  Location: [facility] ENDOSCOPY;  Service: Endoscopy;  Laterality: N/A;  . ESOPHAGOGASTRODUODENOSCOPY N/A 07/08/2015   Procedure: ESOPHAGOGASTRODUODENOSCOPY (EGD);  Surgeon: Sherrilyn Rist, MD;  Location: [facility] ENDOSCOPY;  Service: Endoscopy;  Laterality: N/A;  . ESOPHAGOGASTRODUODENOSCOPY (EGD) WITH PROPOFOL N/A 09/15/2015   Procedure: ESOPHAGOGASTRODUODENOSCOPY (EGD) WITH PROPOFOL;  Surgeon: Ruffin Frederick, MD;  Location: [facility] ENDOSCOPY;  Service: Gastroenterology;  Laterality: N/A;  . GASTROJEJUNOSTOMY     hx/notes 01/10/2015  . GASTROJEJUNOSTOMY N/A 09/23/2015   Procedure: OPEN GASTROJEJUNOSTOMY TUBE PLACEMENT;  Surgeon: De Blanch Kinsinger, MD;  Location: [facility] ORS;  Service: General;  Laterality: N/A;  . GLAUCOMA SURGERY Bilateral   . HERNIA REPAIR  2015  . IR GENERIC HISTORICAL  01/07/2016   IR GJ TUBE CHANGE 01/07/2016 Malachy Moan, MD [facility]INTER[facility] RAD  . IR GENERIC HISTORICAL  01/27/2016   IR MECH REMOV OBSTRUC MAT ANY COLON TUBE W/FLUORO 01/27/2016 Richarda Overlie,  MD [facility]  . IR GENERIC HISTORICAL  02/07/2016   IR PATIENT EVAL TECH 0-60 MINS Darrell K Allred, PA-C [facility]  . IR GENERIC HISTORICAL  02/08/2016   IR GJ TUBE CHANGE 02/08/2016 Berdine Dance, MD [facility]  . IR GENERIC HISTORICAL  01/06/2016   IR GJ TUBE CHANGE 01/06/2016 [facility]-RAD OUT REF  . IR GENERIC HISTORICAL  05/02/2016   IR CM INJ ANY COLONIC TUBE W/FLUORO 05/02/2016 Oley Balm, MD [facility]  . IR GENERIC HISTORICAL  05/15/2016   IR GJ TUBE CHANGE 05/15/2016 Simonne Come, MD [facility]  . IR GENERIC HISTORICAL  06/28/2016   IR GJ TUBE CHANGE 06/28/2016 [facility]  . IR PATIENT EVAL TECH 0-60 MINS  10/19/2016  . IR REPLC DUODEN/JEJUNO TUBE PERCUT W/FLUORO  11/14/2016  . LAPAROTOMY N/A 01/20/2015   Procedure: EXPLORATORY LAPAROTOMY;  Surgeon: Abigail Miyamoto, MD;  Location: [facility] OR;  Service: General;  Laterality: N/A;  . LOWER EXTREMITY  ANGIOGRAPHY Left 09/21/2016   Procedure: Lower Extremity Angiography;  Surgeon: Maeola Harman, MD;  Location: [facility] INVASIVE CV LAB;  Service: Cardiovascular;  Laterality: Left;  . LYSIS OF ADHESION N/A 01/20/2015   Procedure: LYSIS OF ADHESIONS < 1 HOUR;  Surgeon: Abigail Miyamoto, MD;  Location: [facility] OR;  Service: General;  Laterality: N/A;  . PERIPHERAL VASCULAR BALLOON ANGIOPLASTY Left 09/21/2016   Procedure: Peripheral Vascular Balloon Angioplasty;  Surgeon: Maeola Harman, MD;  Location: [facility] INVASIVE CV LAB;  Service: Cardiovascular;  Laterality: Left;  drug coated balloon  . TONSILLECTOMY    . TUBAL LIGATION    . VENTRAL HERNIA REPAIR  2015   incarcerated ventral hernia ([facility] 09/2013)/notes 01/10/2015    Patient Care Team: Kirt Boys, DO as PCP - General (Internal Medicine) Jason Coop, MD as Referring Physician (Cardiology) Felecia Shelling, DPM as Consulting Physician (Podiatry)  Social History   Social History  . Marital status: Widowed    Spouse name: N/A  . Number of children: 3  . Years  of education: N/A   Occupational History  . Not on file.   Social History Main Topics  . Smoking status: Never Smoker  . Smokeless tobacco: Former Neurosurgeon    Types: Snuff     Comment: "used snuff in my younger days"  . Alcohol use No  . Drug use: No  . Sexual activity: No   Other Topics Concern  . Not on file   Social History Narrative   Married in [place], lives in one story house with 2 people and no pets   Occupation: ?   Has a living Will, [place], and doesn't have DNR   Was living with Husband (in frail health) in [place] until 09/2013.  After her [facility] admission they both came to live with daughter in [place]     reports that she has never smoked. She has quit using smokeless tobacco. Her smokeless tobacco use included Snuff. She reports that she does not drink alcohol or use drugs.  Family History  Problem Relation Age of Onset  . Stroke Mother   . Hypertension Mother   . Diabetes Brother   . Heart attack Neg Hx    Family Status  Relation Status  . Mother Deceased  . Father Deceased  . MGM Deceased  . MGF Deceased  . PGM Deceased  . PGF Deceased  . Brother (Not Specified)  . Daughter Alive  . Neg Hx (Not Specified)     Allergies  Allergen Reactions  . Penicillins Itching, Rash and Other (See Comments)    Tolerated unasyn, zosyn & cephalosporins in the past.  Has patient had a PCN reaction causing immediate rash, facial/tongue/throat swelling, SOB or lightheadedness with hypotension: Yes Has patient had a PCN reaction causing severe rash involving mucus membranes or skin necrosis: Unk Has patient had a PCN reaction that required hospitalization: Unk Has patient had a PCN reaction occurring within the last 10 years: Unk If all of the above answers are "NO", then may proceed with Cephalosporins  . Other     Patient tolerates amoxicillin and cephalosporins    Medications: Patient's Medications  New Prescriptions   No medications on file  Previous  Medications   ACETAMINOPHEN (TYLENOL) 500 MG TABLET    Take 1,000 mg by mouth every 6 (six) hours as needed (pain).   AMINO ACIDS-PROTEIN HYDROLYS PO    Take 30 mLs by mouth 2 (two) times daily.   ATORVASTATIN (LIPITOR) 10 MG  TABLET    Take 10 mg by mouth. Take one tablet daily for cholesterol   B COMPLEX VITAMINS TABLET    Take 1 tablet by mouth daily.    CALCIUM-VITAMIN D (OSCAL WITH D) 500-200 MG-UNIT PER TABLET    Take 1 tablet by mouth daily with breakfast.    CARVEDILOL (COREG) 6.25 MG TABLET    Take 2 tabs po in AM and 1 tab po in PM for blood pressure   CLOPIDOGREL (PLAVIX) 75 MG TABLET    TAKE 1 TABLET (75 MG TOTAL) BY MOUTH DAILY WITH BREAKFAST.   FEEDING SUPPLEMENT (OSMOLITE 1 CAL) LIQD    Take 711 mLs by mouth at bedtime.    FERROUS SULFATE 325 (65 FE) MG TABLET    Take 1 tablet (325 mg total) by mouth daily with breakfast.   FLUTICASONE (FLONASE) 50 MCG/ACT NASAL SPRAY    Place 2 sprays into both nostrils daily.   FUROSEMIDE (LASIX) 40 MG TABLET    Take 1 tablet (40 mg total) by mouth daily.   GABAPENTIN (NEURONTIN) 300 MG CAPSULE    TAKE 2 CAPSULES (600 MG TOTAL) BY MOUTH 3 (THREE) TIMES DAILY.   HYDRALAZINE (APRESOLINE) 25 MG TABLET    Take 2 tablets in the mornings, 1 tablet in the afternoons, and 1 tablet in the evenings   INSULIN GLARGINE (LANTUS) 100 UNIT/ML SOLOSTAR PEN    Injection 0.1 ml (10 units) once daily to control blood sugar. Dx E11.9   IPRATROPIUM-ALBUTEROL (DUONEB) 0.5-2.5 (3) MG/3ML SOLN    Take 3 mLs by nebulization every 6 (six) hours as needed (sob). Use 3 times daily x 4 days then every 6 hours as needed.   ISOSORBIDE MONONITRATE (IMDUR) 30 MG 24 HR TABLET    Take 30 mg by mouth daily.   LATANOPROST (XALATAN) 0.005 % OPHTHALMIC SOLUTION    Place 1 drop into both eyes at bedtime.   LEVALBUTEROL (XOPENEX HFA) 45 MCG/ACT INHALER    Inhale 2 puffs into the lungs every 6 (six) hours as needed for wheezing or shortness of breath.   LEVOTHYROXINE (SYNTHROID,  LEVOTHROID) 137 MCG TABLET    TAKE 1 TABLET (137 MCG TOTAL) BY MOUTH DAILY BEFORE BREAKFAST.   MALTODEXTRIN-XANTHAN GUM (RESOURCE THICKENUP CLEAR) POWD    Take 120 g by mouth as needed (use to thicken liquids.).   MOMETASONE-FORMOTEROL (DULERA) 200-5 MCG/ACT AERO    Inhale 2 puffs into the lungs 2 (two) times daily.   MULTIPLE VITAMIN (MULTIVITAMIN WITH MINERALS) TABS TABLET    Take 1 tablet by mouth daily.   NOVOLOG FLEXPEN 100 UNIT/ML FLEXPEN    BEFORE EACH MEAL 3 TIMES DAILY,140-199-2UNITS,200-250-6 UNITS,300-349-8 UNITS, 350 OR ABOVE 10 UNITS   NYSTATIN (MYCOSTATIN/NYSTOP) POWDER    Apply topically 3 (three) times daily.   OXYGEN    Inhale 2 L into the lungs at bedtime.   PANTOPRAZOLE (PROTONIX) 40 MG TABLET    Take 1 tablet (40 mg total) by mouth 2 (two) times daily.   POLYETHYL GLYCOL-PROPYL GLYCOL 0.4-0.3 % SOLN    Place 1 drop into both eyes 4 (four) times daily.    RESTASIS 0.05 % OPHTHALMIC EMULSION    USE 1 DROP INTO BOTH EYES TWICE DAILY   SERTRALINE (ZOLOFT) 100 MG TABLET    Take 100 mg by mouth. Take one tablet each morning for depression   WARFARIN (COUMADIN) 2.5 MG TABLET    TAKE AS DIRECTED BY COUMADIN CLINIC.   WATER FOR IRRIGATION, STERILE (FREE WATER) SOLN  Place 230 mLs into feeding tube 3 (three) times daily.   WHITE PETROLATUM-MINERAL OIL (SYSTANE NIGHTTIME) OINT    Apply 1 application to eye at bedtime. Both eyes  Modified Medications   No medications on file  Discontinued Medications   BENZONATATE (TESSALON PERLES) 100 MG CAPSULE    Take two capsules by mouth three times daily as needed for cough    Review of Systems  Gastrointestinal: Positive for abdominal pain.  Musculoskeletal: Positive for arthralgias and gait problem.  All other systems reviewed and are negative.   Vitals:   12/08/16 1159  BP: 124/70  Pulse: 72  Resp: 12  Weight: 152 lb (68.9 kg)  Height: 5\' 6"  (1.676 m)   Body mass index is 24.53 kg/m.  Physical Exam  Constitutional: She is  oriented to person, place, and time. She appears well-developed and well-nourished.  Frail appearing in NAD  HENT:  Mouth/Throat: Oropharynx is clear and moist. No oropharyngeal exudate.  No external ear canal swelling, redness or d/c; TMs intact b/l with no redness/bulging; MMM; no oral thrush  Eyes: Pupils are equal, round, and reactive to light. No scleral icterus.  Neck: Neck supple. Muscular tenderness present. No spinous process tenderness present. Carotid bruit is not present. Decreased range of motion present. No tracheal deviation present. No thyromegaly present.    Cardiovascular: Normal rate, regular rhythm, normal heart sounds and intact distal pulses.  Exam reveals no gallop and no friction rub.   No murmur heard. No LE edema b/l. no calf TTP.   Pulmonary/Chest: Effort normal. No stridor. No respiratory distress. She has wheezes (end expiratory with prolonged expiratory phase). She has no rales.  Abdominal: Soft. Normal appearance and bowel sounds are normal. She exhibits no distension and no mass. There is no hepatomegaly. There is tenderness. There is no rigidity, no rebound and no guarding. No hernia.    Musculoskeletal: She exhibits edema.  Lymphadenopathy:    She has no cervical adenopathy.  Neurological: She is alert and oriented to person, place, and time.  Skin: Skin is warm and dry. No rash noted.  Psychiatric: She has a normal mood and affect. Her behavior is normal. Judgment and thought content normal.     Labs reviewed: Anti-coag visit on 12/08/2016  Component Date Value Ref Range Status  . INR 12/08/2016 1.8   Final  Anti-coag visit on 11/22/2016  Component Date Value Ref Range Status  . INR 11/22/2016 1.9   Final  Office Visit on 11/15/2016  Component Date Value Ref Range Status  . WBC 11/15/2016 5.5  3.8 - 10.8 K/uL Final  . RBC 11/15/2016 3.76* 3.80 - 5.10 MIL/uL Final  . Hemoglobin 11/15/2016 10.9* 11.7 - 15.5 g/dL Final  . HCT 02/72/5366 33.8* 35.0  - 45.0 % Final  . MCV 11/15/2016 89.9  80.0 - 100.0 fL Final  . MCH 11/15/2016 29.0  27.0 - 33.0 pg Final  . MCHC 11/15/2016 32.2  32.0 - 36.0 g/dL Final  . RDW 44/06/4740 18.8* 11.0 - 15.0 % Final  . Platelets 11/15/2016 242  140 - 400 K/uL Final  . MPV 11/15/2016 10.5  7.5 - 12.5 fL Final  . Neutro Abs 11/15/2016 2420  1,500 - 7,800 cells/uL Final  . Lymphs Abs 11/15/2016 1210  850 - 3,900 cells/uL Final  . Monocytes Absolute 11/15/2016 770  200 - 950 cells/uL Final  . Eosinophils Absolute 11/15/2016 1045* 15 - 500 cells/uL Final  . Basophils Absolute 11/15/2016 55  0 - 200 cells/uL Final  .  Neutrophils Relative % 11/15/2016 44  % Final  . Lymphocytes Relative 11/15/2016 22  % Final  . Monocytes Relative 11/15/2016 14  % Final  . Eosinophils Relative 11/15/2016 19  % Final  . Basophils Relative 11/15/2016 1  % Final  . Smear Review 11/15/2016 Criteria for review not met   Final  . INR 11/15/2016 1.7   Final  Anti-coag visit on 11/08/2016  Component Date Value Ref Range Status  . INR 11/08/2016 1.8   Final  Admission on 11/02/2016, Discharged on 11/02/2016  Component Date Value Ref Range Status  . WBC 11/02/2016 8.0  4.0 - 10.5 K/uL Final  . RBC 11/02/2016 3.91  3.87 - 5.11 MIL/uL Final  . Hemoglobin 11/02/2016 11.1* 12.0 - 15.0 g/dL Final  . HCT 09/81/1914 32.8* 36.0 - 46.0 % Final  . MCV 11/02/2016 83.9  78.0 - 100.0 fL Final  . MCH 11/02/2016 28.4  26.0 - 34.0 pg Final  . MCHC 11/02/2016 33.8  30.0 - 36.0 g/dL Final  . RDW 78/29/5621 18.0* 11.5 - 15.5 % Final  . Platelets 11/02/2016 231  150 - 400 K/uL Final  . Sodium 11/02/2016 133* 135 - 145 mmol/L Final  . Potassium 11/02/2016 4.4  3.5 - 5.1 mmol/L Final  . Chloride 11/02/2016 98* 101 - 111 mmol/L Final  . CO2 11/02/2016 29  22 - 32 mmol/L Final  . Glucose, Bld 11/02/2016 110* 65 - 99 mg/dL Final  . BUN 30/86/5784 32* 6 - 20 mg/dL Final  . Creatinine, Ser 11/02/2016 1.01* 0.44 - 1.00 mg/dL Final  . Calcium 69/62/9528  9.7  8.9 - 10.3 mg/dL Final  . Total Protein 11/02/2016 7.0  6.5 - 8.1 g/dL Final  . Albumin 41/32/4401 3.5  3.5 - 5.0 g/dL Final  . AST 02/72/5366 24  15 - 41 U/L Final  . ALT 11/02/2016 19  14 - 54 U/L Final  . Alkaline Phosphatase 11/02/2016 58  38 - 126 U/L Final  . Total Bilirubin 11/02/2016 0.5  0.3 - 1.2 mg/dL Final  . GFR calc non Af Amer 11/02/2016 47* >60 mL/min Final  . GFR calc Af Amer 11/02/2016 55* >60 mL/min Final   Comment: (NOTE) The eGFR has been calculated using the CKD EPI equation. This calculation has not been validated in all clinical situations. eGFR's persistently <60 mL/min signify possible Chronic Kidney Disease.   . Anion gap 11/02/2016 6  5 - 15 Final  . Color, Urine 11/02/2016 YELLOW  YELLOW Final  . APPearance 11/02/2016 CLEAR  CLEAR Final  . Specific Gravity, Urine 11/02/2016 1.006  1.005 - 1.030 Final  . pH 11/02/2016 8.0  5.0 - 8.0 Final  . Glucose, UA 11/02/2016 NEGATIVE  NEGATIVE mg/dL Final  . Hgb urine dipstick 11/02/2016 NEGATIVE  NEGATIVE Final  . Bilirubin Urine 11/02/2016 NEGATIVE  NEGATIVE Final  . Ketones, ur 11/02/2016 NEGATIVE  NEGATIVE mg/dL Final  . Protein, ur 44/06/4740 100* NEGATIVE mg/dL Final  . Nitrite 59/56/3875 NEGATIVE  NEGATIVE Final  . Leukocytes, UA 11/02/2016 NEGATIVE  NEGATIVE Final  . RBC / HPF 11/02/2016 6-30  0 - 5 RBC/hpf Final  . WBC, UA 11/02/2016 0-5  0 - 5 WBC/hpf Final  . Bacteria, UA 11/02/2016 NONE SEEN  NONE SEEN Final  . Squamous Epithelial / LPF 11/02/2016 NONE SEEN  NONE SEEN Final  . Troponin I 11/02/2016 <0.03  <0.03 ng/mL Final  Anti-coag visit on 10/26/2016  Component Date Value Ref Range Status  . INR 10/26/2016 3.3  Final  Anti-coag visit on 10/19/2016  Component Date Value Ref Range Status  . INR 10/19/2016 2.0   Final  Anti-coag visit on 10/12/2016  Component Date Value Ref Range Status  . INR 10/12/2016 1.9   Final   [facility] RN Noreene Larsson  Anti-coag visit on 10/05/2016  Component Date Value Ref  Range Status  . INR 10/05/2016 1.1   Final   [facility]  Office Visit on 10/03/2016  Component Date Value Ref Range Status  . WBC 10/03/2016 5.8  3.8 - 10.8 K/uL Final  . RBC 10/03/2016 3.75* 3.80 - 5.10 MIL/uL Final  . Hemoglobin 10/03/2016 10.5* 11.7 - 15.5 g/dL Final  . HCT 16/01/9603 33.2* 35.0 - 45.0 % Final  . MCV 10/03/2016 88.5  80.0 - 100.0 fL Final  . MCH 10/03/2016 28.0  27.0 - 33.0 pg Final  . MCHC 10/03/2016 31.6* 32.0 - 36.0 g/dL Final  . RDW 54/12/8117 17.8* 11.0 - 15.0 % Final  . Platelets 10/03/2016 358  140 - 400 K/uL Final  . MPV 10/03/2016 9.8  7.5 - 12.5 fL Final  . Neutro Abs 10/03/2016 3190  1,500 - 7,800 cells/uL Final  . Lymphs Abs 10/03/2016 1276  850 - 3,900 cells/uL Final  . Monocytes Absolute 10/03/2016 696  200 - 950 cells/uL Final  . Eosinophils Absolute 10/03/2016 580* 15 - 500 cells/uL Final  . Basophils Absolute 10/03/2016 58  0 - 200 cells/uL Final  . Neutrophils Relative % 10/03/2016 55  % Final  . Lymphocytes Relative 10/03/2016 22  % Final  . Monocytes Relative 10/03/2016 12  % Final  . Eosinophils Relative 10/03/2016 10  % Final  . Basophils Relative 10/03/2016 1  % Final  . Smear Review 10/03/2016 Criteria for review not met   Final  . Sodium 10/03/2016 134* 135 - 146 mmol/L Final  . Potassium 10/03/2016 4.7  3.5 - 5.3 mmol/L Final  . Chloride 10/03/2016 97* 98 - 110 mmol/L Final  . CO2 10/03/2016 27  20 - 31 mmol/L Final  . Glucose, Bld 10/03/2016 102* 65 - 99 mg/dL Final  . BUN 14/78/2956 32* 7 - 25 mg/dL Final  . Creat 21/30/8657 1.10* 0.60 - 0.88 mg/dL Final   Comment:   For patients > or = 81 years of age: The upper reference limit for Creatinine is approximately 13% higher for people identified as African-American.     . Total Bilirubin 10/03/2016 0.4  0.2 - 1.2 mg/dL Final  . Alkaline Phosphatase 10/03/2016 73  33 - 130 U/L Final  . AST 10/03/2016 20  10 - 35 U/L Final  . ALT 10/03/2016 25  6 - 29 U/L Final  . Total Protein 10/03/2016  7.1  6.1 - 8.1 g/dL Final  . Albumin 84/69/6295 3.2* 3.6 - 5.1 g/dL Final  . Calcium 28/41/3244 9.1  8.6 - 10.4 mg/dL Final  . GFR, Est African American 10/03/2016 51* >=60 mL/min Final  . GFR, Est Non African American 10/03/2016 44* >=60 mL/min Final  There may be more visits with results that are not included.    Ir Replc Duoden/jejuno Tube Percut W/fluoro  Result Date: 11/15/2016 INDICATION: Pain and bleeding at entry site of gastric jejunal feeding tube. The patient currently has a 22 [place] balloon retention straight jejunal catheter entering through the level of prior gastrostomy and terminating in the jejunum. The tube was last exchanged on 06/28/2016 EXAM: REPLACEMENT OF GASTROJEJUNAL FEEDING TUBE UNDER FLUOROSCOPY MEDICATIONS: None ANESTHESIA/SEDATION: None CONTRAST:  10 mL Isovue-300 - administered  into the gastric lumen. FLUOROSCOPY TIME:  Fluoroscopy Time: 4 minutes and 30 seconds. (52.4 mGy). COMPLICATIONS: None immediate. PROCEDURE: Informed written consent was obtained from the patient after a thorough discussion of the procedural risks, benefits and alternatives. All questions were addressed. Maximal Sterile Barrier Technique was utilized including caps, mask, sterile gowns, sterile gloves, sterile drape, hand hygiene and skin antiseptic. A timeout was performed prior to the initiation of the procedure. Initial inspection of the tube exit site was performed. Decision was made to exchange the catheter. The retention balloon was deflated. The catheter was partially retracted and cut. It was then removed over a guidewire. The guidewire was further advanced over a 5 French catheter into the jejunum. A new 22 French balloon retention jejunostomy catheter was prepared. Approximately 8 cm was cut off of the end of the catheter. The catheter was advanced over the guidewire. The retention balloon was inflated with 8 mL of saline. The catheter was injected with contrast material and a fluoroscopic  spot image obtained to confirm tube positioning. The catheter was then flushed. FINDINGS: There was some evidence of some deterioration of the pre-existing catheter as well as some bleeding around the catheter exit site. Decision was made to exchange the catheter for a new catheter. Fluoroscopy demonstrates the previously exchanged catheter to be partially looped in the stomach with the tip in the proximal jejunum. After the catheter was removed over a guidewire, it became evident that there was some bleeding that appears to be emanating from the stomach rather than at the level of the skin. It is possible that the loop of catheter present in the stomach was causing some gastric irritation. Other etiology of gastric bleed cannot be excluded. Based on the fluoroscopic appearance of the catheter, the catheter appears to be slightly too long for the patient's body and therefore the new catheter was cut shorter in order to fit better to the patient's anatomy. After exchange, the catheter course is smooth through the stomach and duodenum with the catheter tip in the proximal jejunum. IMPRESSION: Exchange of 22 French balloon retention single lumen gastrojejunal feeding tube. As above, there was a loop of catheter in the stomach and when the catheter was exchanged, there does appear to be some bleeding emanating from the stomach itself. Prior to tube exchange, the new catheter was cut approximately 8 cm shorter. Catheter course is now smooth and the catheter tip is in the proximal jejunum. Electronically Signed   By: Irish Lack M.D.   On: 11/15/2016 11:20     Assessment/Plan   ICD-10-CM   1. Left upper quadrant pain R10.12 US Abdomen Complete  2. PEG (percutaneous endoscopic gastrostomy) status (HCC) Z93.1 US Abdomen Complete  3. Chronic neck pain M54.2    G89.29   4. Seasonal allergic rhinitis, unspecified trigger J30.2   5. Essential hypertension, benign I10     Flu shot next month  Will call with  [place] appt  Continue current medications as ordered  Continue tube feedings as ordered  Follow up with specialist as scheduled  Follow up in 1 month for TF, arthritis, depression, DM   Azalya Galyon S. Ancil Linsey  [facility] [address] [place], [place] [address] [phone] Cell (Monday-Friday 8 AM - 5 PM) [phone] After 5 PM and follow prompts

## 2016-12-08 NOTE — Patient Instructions (Addendum)
Will call with [place] appt  Continue current medications as ordered  Continue tube feedings as ordered  Follow up with specialist as scheduled  Follow up in 1 month for TF, arthritis, depression, DM

## 2016-12-11 ENCOUNTER — Encounter: Payer: Self-pay | Admitting: Internal Medicine

## 2016-12-11 ENCOUNTER — Other Ambulatory Visit: Payer: Self-pay | Admitting: Internal Medicine

## 2016-12-11 ENCOUNTER — Encounter: Payer: Self-pay | Admitting: Vascular Surgery

## 2016-12-11 DIAGNOSIS — E113292 Type 2 diabetes mellitus with mild nonproliferative diabetic retinopathy without macular edema, left eye: Secondary | ICD-10-CM | POA: Diagnosis not present

## 2016-12-11 DIAGNOSIS — Z961 Presence of intraocular lens: Secondary | ICD-10-CM | POA: Diagnosis not present

## 2016-12-11 DIAGNOSIS — E113291 Type 2 diabetes mellitus with mild nonproliferative diabetic retinopathy without macular edema, right eye: Secondary | ICD-10-CM | POA: Diagnosis not present

## 2016-12-11 LAB — HM DIABETES EYE EXAM

## 2016-12-12 ENCOUNTER — Other Ambulatory Visit: Payer: Self-pay | Admitting: Nurse Practitioner

## 2016-12-12 ENCOUNTER — Encounter: Payer: Self-pay | Admitting: Vascular Surgery

## 2016-12-12 ENCOUNTER — Other Ambulatory Visit: Payer: Self-pay | Admitting: *Deleted

## 2016-12-12 ENCOUNTER — Ambulatory Visit (INDEPENDENT_AMBULATORY_CARE_PROVIDER_SITE_OTHER): Payer: Self-pay | Admitting: Vascular Surgery

## 2016-12-12 VITALS — BP 167/72 | HR 65 | Temp 96.7°F | Resp 18 | Ht 66.0 in | Wt 153.0 lb

## 2016-12-12 DIAGNOSIS — I739 Peripheral vascular disease, unspecified: Secondary | ICD-10-CM

## 2016-12-12 DIAGNOSIS — K922 Gastrointestinal hemorrhage, unspecified: Secondary | ICD-10-CM

## 2016-12-12 MED ORDER — GABAPENTIN 300 MG PO CAPS: 600.0000 mg | ORAL_CAPSULE | Freq: Three times a day (TID) | ORAL | 1 refills | Status: DC

## 2016-12-12 NOTE — Telephone Encounter (Signed)
[name], caregiver called and stated that patient's Rx was called in for only #90 last time and she needs #180 taking 6/day. Rx faxed to pharmacy.

## 2016-12-12 NOTE — Progress Notes (Signed)
POST OPERATIVE OFFICE NOTE    CC:  F/u for surgery  HPI:  This is a 83 y.o. female who is s/p left SFA drug coated balloon angioplast with 74mm impact admiral on 09/21/16 by Dr. 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 Donnetta Hutching.  She was seen 6 weeks ago and her dressing changes were changed from bid to daily.  She returns today for wound check.  She and her daughter say she is doing ok and feel the area has healed.  She lives with her daughter and is getting around with a walker.  Allergies  Allergen Reactions  . Penicillins Itching, Rash and Other (See Comments)    Tolerated unasyn, zosyn & cephalosporins in the past.  Has patient had a PCN reaction causing immediate rash, facial/tongue/throat swelling, SOB or lightheadedness with hypotension: Yes Has patient had a PCN reaction causing severe rash involving mucus membranes or skin necrosis: Unk Has patient had a PCN reaction that required hospitalization: Unk Has patient had a PCN reaction occurring within the last 10 years: Unk If all of the above answers are "NO", then may proceed with Cephalosporins  . Other     Patient tolerates amoxicillin and cephalosporins    Current Outpatient Prescriptions  Medication Sig Dispense Refill  . acetaminophen (TYLENOL) 500 MG tablet Take 1,000 mg by mouth every 6 (six) hours as needed (pain).    . AMINO ACIDS-PROTEIN HYDROLYS PO Take 30 mLs by mouth 2 (two) times daily.    [name] atorvastatin (LIPITOR) 10 MG tablet Take 10 mg by mouth. Take one tablet daily for cholesterol    . b complex vitamins tablet Take 1 tablet by mouth daily.     . B-D UF III MINI PEN NEEDLES 31G X 5 MM MISC USE AS DIRECTED 100 each 3  . calcium-vitamin D (OSCAL WITH D) 500-200 MG-UNIT per tablet Take 1 tablet by mouth daily with breakfast.     . carvedilol (COREG) 6.25 MG tablet Take 2 tabs po in AM and 1 tab po in PM for blood pressure 90 tablet 6  . clopidogrel (PLAVIX) 75 MG  tablet TAKE 1 TABLET (75 MG TOTAL) BY MOUTH DAILY WITH BREAKFAST. 90 tablet 1  . feeding supplement (OSMOLITE 1 CAL) LIQD Take 711 mLs by mouth at bedtime.     . ferrous sulfate 325 (65 FE) MG tablet TAKE 1 TABLET BY MOUTH EVERY DAY WITH BREAKFAST 30 tablet 6  . fluticasone (FLONASE) 50 MCG/ACT nasal spray Place 2 sprays into both nostrils daily. 16 g 3  . furosemide (LASIX) 40 MG tablet Take 1 tablet (40 mg total) by mouth daily. 30 tablet 11  . gabapentin (NEURONTIN) 300 MG capsule Take 2 capsules (600 mg total) by mouth 3 (three) times daily. 180 capsule 1  . hydrALAZINE (APRESOLINE) 25 MG tablet Take 2 tablets in the mornings, 1 tablet in the afternoons, and 1 tablet in the evenings 120 tablet 11  . Insulin Glargine (LANTUS) 100 UNIT/ML Solostar Pen Injection 0.1 ml (10 units) once daily to control blood sugar. Dx E11.9 15 mL 5  . ipratropium-albuterol (DUONEB) 0.5-2.5 (3) MG/3ML SOLN Take 3 mLs by nebulization every 6 (six) hours as needed (sob). Use 3 times daily x 4 days then every 6 hours as needed. 360 mL 4  . isosorbide mononitrate (IMDUR) 30 MG 24 hr tablet Take 30 mg by mouth daily.    [name] latanoprost (XALATAN) 0.005 % ophthalmic solution Place  1 drop into both eyes at bedtime. 2.5 mL 12  . levalbuterol (XOPENEX HFA) 45 MCG/ACT inhaler Inhale 2 puffs into the lungs every 6 (six) hours as needed for wheezing or shortness of breath. 1 Inhaler 12  . levothyroxine (SYNTHROID, LEVOTHROID) 137 MCG tablet TAKE 1 TABLET (137 MCG TOTAL) BY MOUTH DAILY BEFORE BREAKFAST. 30 tablet 2  . Maltodextrin-Xanthan Gum (RESOURCE THICKENUP CLEAR) POWD Take 120 g by mouth as needed (use to thicken liquids.). 20 Can 0  . mometasone-formoterol (DULERA) 200-5 MCG/ACT AERO Inhale 2 puffs into the lungs 2 (two) times daily. 13 g 6  . Multiple Vitamin (MULTIVITAMIN WITH MINERALS) TABS tablet Take 1 tablet by mouth daily.    [name] NOVOLOG FLEXPEN 100 UNIT/ML FlexPen BEFORE EACH MEAL 3 TIMES DAILY,140-199-2UNITS,200-250-6  UNITS,300-349-8 UNITS, 350 OR ABOVE 10 UNITS 15 pen 6  . nystatin (MYCOSTATIN/NYSTOP) powder Apply topically 3 (three) times daily. 15 g 0  . OXYGEN Inhale 2 L into the lungs at bedtime.    . pantoprazole (PROTONIX) 40 MG tablet Take 1 tablet (40 mg total) by mouth 2 (two) times daily. 60 tablet 1  . Polyethyl Glycol-Propyl Glycol 0.4-0.3 % SOLN Place 1 drop into both eyes 4 (four) times daily.     . RESTASIS 0.05 % ophthalmic emulsion USE 1 DROP INTO BOTH EYES TWICE DAILY 60 mL 3  . sertraline (ZOLOFT) 100 MG tablet Take 100 mg by mouth. Take one tablet each morning for depression    . warfarin (COUMADIN) 2.5 MG tablet TAKE AS DIRECTED BY COUMADIN CLINIC. (Patient taking differently: 5MG  Mon/Thurs. 3.75MG  on all other days) 50 tablet 1  . Water For Irrigation, Sterile (FREE WATER) SOLN Place 230 mLs into feeding tube 3 (three) times daily.    Dema Severin Petrolatum-Mineral Oil (SYSTANE NIGHTTIME) OINT Apply 1 application to eye at bedtime. Both eyes     No current facility-administered medications for this visit.      ROS:  See HPI  Physical Exam:  Vitals:   12/12/16 1501 12/12/16 1505  BP: (!) 177/72 (!) 167/72  Pulse: 65   Resp: 18   Temp: (!) 96.7 F (35.9 C)   SpO2: 93%     Incision:  Amputation site has healed Extremities:  She does have a callused area on the left lateral portion of the great toe.  There is a pinpoint area on the tip of the great toe-neither of these areas are draining and are dry.  Her left foot is warm.    Assessment/Plan:  This is a 83 y.o. female who is s/p: left SFA drug coated balloon angioplast with 52mm impact admiral on 09/21/16 by Dr. 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 Donnetta Hutching.   -pt doing well and her amputation site has healed. She does have a callused area on the left lateral portion of the great toe.  Dr. Donnetta Hutching explained to the daughter that if this starts to change and become worrisome, to give  [place] a call and we will see her right away.  Otherwise, she can shower and does not need daily dressing changes any longer.  She does not need the darco shoe any longer.  Shower daily and pat dry. -she will f/u with [place] as needed.   Leontine Locket, PA-C [facility] [phone]  Clinic MD:  Pt seen and examined with Dr. Donnetta Hutching

## 2016-12-19 ENCOUNTER — Ambulatory Visit
Admission: RE | Admit: 2016-12-19 | Discharge: 2016-12-19 | Disposition: A | Discharge status: Home / Self Care | Payer: Medicare Other | Source: Ambulatory Visit | Attending: Internal Medicine | Admitting: Internal Medicine

## 2016-12-19 ENCOUNTER — Other Ambulatory Visit: Payer: Self-pay | Admitting: Internal Medicine

## 2016-12-19 DIAGNOSIS — Z931 Gastrostomy status: Secondary | ICD-10-CM

## 2016-12-19 DIAGNOSIS — R1012 Left upper quadrant pain: Secondary | ICD-10-CM

## 2016-12-19 DIAGNOSIS — R1909 Other intra-abdominal and pelvic swelling, mass and lump: Secondary | ICD-10-CM | POA: Diagnosis not present

## 2016-12-20 ENCOUNTER — Ambulatory Visit (INDEPENDENT_AMBULATORY_CARE_PROVIDER_SITE_OTHER): Payer: Medicare Other | Admitting: Pharmacist

## 2016-12-20 VITALS — BP 148/70 | HR 71 | Wt 151.0 lb

## 2016-12-20 DIAGNOSIS — I70262 Atherosclerosis of native arteries of extremities with gangrene, left leg: Secondary | ICD-10-CM | POA: Diagnosis not present

## 2016-12-20 DIAGNOSIS — I4892 Unspecified atrial flutter: Secondary | ICD-10-CM

## 2016-12-20 DIAGNOSIS — I10 Essential (primary) hypertension: Secondary | ICD-10-CM | POA: Diagnosis not present

## 2016-12-20 DIAGNOSIS — Z5181 Encounter for therapeutic drug level monitoring: Secondary | ICD-10-CM

## 2016-12-20 LAB — POCT INR: INR: 1.6

## 2016-12-20 NOTE — Patient Instructions (Signed)
It was nice to meet you today.  Your blood pressure was within goal of <150/90 mmHg today at 148/70 mmHg.   Continue to take your current medications. Continue to take your blood pressure at home.   Please call us if you have any concerns and we will see you back in 1 month for your follow-up.

## 2016-12-20 NOTE — Progress Notes (Signed)
Patient ID: Shannon [name]                 DOB: 1923/10/28                      MRN: [id number]     HPI: Shannon [name] is a 83 y.o. female patient of Dr. Delton [name] who presents today for hypertension evaluation.  PMH includes stage III CKD, COPD, HLD, HTN, afib, chronic diastolic HF (EF 60-65%). Pt was seen by Dr. Montez [name] in office on 8/3 for hypertension related headaches following an ED visit on 8/2 after a home BP reading of 209/93. Her lasix was stopped, and she was discharged with metoprolol tartrate 25 mg. At the office visit, BP was 146/72. Coreg was increased to 6.25 mg BID and metoprolol tartrate was discontinued. Pt's daughter called into clinic on 8/13 reporting pt's BP high in the mornings 190-202/70s-90s and HR 60-70. Pt also complained of left leg swelling and weight gain of 5 lbs in the last week and a half. Lasix 40 mg daily was started by Dr. Delton [name].   At her HTN clinic visit on 11/22/16, pt's blood pressure was elevated at 164/78 mmHg. Hydralazine was adjusted to dose 50 mg in the mornings, 25 mg in the afternoon, and 25 mg in the evenings to provide better BP control for higher morning readings. --- Patient presents today for HTN clinic in good spirits with her daughter. She states that her swelling has improved and her weight remains stable on Lasix 40 mg daily. She denies dizziness, lightheadedness, or recent falls. She continues to take her blood pressure in the morning one hour after she takes her medicines and also takes her blood pressure in the evening. She feels comfortable continuing this medication regimen and states her head feels much better since her blood pressure has come down.   Current HTN meds:  Carvedilol 6.25mg  2 tablets (12.5mg ) in the mornings and 1 tablet (6.25mg ) in the evenings (10 AM and 6 PM) Lasix 40mg  daily (10 AM) Hydralazine 50 mg in the mornings, 25 mg in the afternoon,and 25 mg in evenings (10 AM, 6 PM, 9 PM) Imdur 30 mg daily (10 AM)  Previously tried:  Losartan 100 mg daily, lisinopril 10 mg daily, amlodipine 10 mg daily - swelling  BP goal: <150/90 mmHg  Family History: Mother - Stroke, HTN. Brother - Diabetes.   Social History: Reports that she has never smoked. Has quit using smokeless tobacco. Denies alcohol or illicit drug use.   Diet: Liquid diet, feeding tube. No coffee. Has diet ginger ale, some diet Coke.   Exercise: Light walking a few times a week, varies.   Home BP readings: ~140's/70's. Did not bring in log sheet.  Wt Readings from Last 3 Encounters:  12/12/16 153 lb (69.4 kg)  12/08/16 152 lb (68.9 kg)  11/15/16 154 lb (69.9 kg)   BP Readings from Last 3 Encounters:  12/12/16 (!) 167/72  12/08/16 124/70  11/22/16 (!) 164/78   Pulse Readings from Last 3 Encounters:  12/12/16 65  12/08/16 72  11/22/16 65    Renal function: CrCl cannot be calculated (Patient's most recent lab result is older than the maximum 21 days allowed.).  Past Medical History:  Diagnosis Date  . Acute respiratory failure (HCC)   . Anemia    previous blood transfusions  . Arthritis    "all over"  . Asthma   . Bradycardia    requiring previous d/c of BB and  reduction of amiodarone  . CAD (coronary artery disease)    nonobstructive per notes  . Chronic diastolic CHF (congestive heart failure) (HCC)   . CKD (chronic kidney disease), stage III   . Complication of blood transfusion    "got the wrong blood type at [facility] in ~ 2015; no adverse reaction that we are aware of"/daughter, Shannon [name] (01/27/2016)  . COPD (chronic obstructive pulmonary disease) (HCC)   . Depression    "light case"  . DVT (deep venous thrombosis) (HCC) 01/2016   a. LLE DVT 01/2016 - switched from Eliquis to Coumadin.  . Gastric stenosis    a. s/p stomach tube  . GERD (gastroesophageal reflux disease)   . History of blood transfusion    "several" (01/27/2016)  . History of stomach ulcers   . Hyperlipidemia   . Hypertension   . Hypothyroidism   .  Paraesophageal hernia   . Perforated gastric ulcer (HCC)   . Seasonal allergies   . SIADH (syndrome of inappropriate ADH production) (HCC)    Shannon [name] 01/10/2015  . Small bowel obstruction (HCC)    "I don't know how many" (01/11/2015)  . Stroke (HCC)    "light one"  . Type II diabetes mellitus (HCC)    "related to prednisone use  for > 20 yr; once predinose stopped; no more DM RX" (01/27/2016)  . UTI (urinary tract infection) 02/08/2016  . Ventral hernia with bowel obstruction     Current Outpatient Prescriptions on File Prior to Visit  Medication Sig Dispense Refill  . acetaminophen (TYLENOL) 500 MG tablet Take 1,000 mg by mouth every 6 (six) hours as needed (pain).    . AMINO ACIDS-PROTEIN HYDROLYS PO Take 30 mLs by mouth 2 (two) times daily.    [name] atorvastatin (LIPITOR) 10 MG tablet Take 10 mg by mouth. Take one tablet daily for cholesterol    . b complex vitamins tablet Take 1 tablet by mouth daily.     . B-D UF III MINI PEN NEEDLES 31G X 5 MM MISC USE AS DIRECTED 100 each 3  . calcium-vitamin D (OSCAL WITH D) 500-200 MG-UNIT per tablet Take 1 tablet by mouth daily with breakfast.     . carvedilol (COREG) 6.25 MG tablet Take 2 tabs po in AM and 1 tab po in PM for blood pressure 90 tablet 6  . clopidogrel (PLAVIX) 75 MG tablet TAKE 1 TABLET (75 MG TOTAL) BY MOUTH DAILY WITH BREAKFAST. 90 tablet 1  . feeding supplement (OSMOLITE 1 CAL) LIQD Take 711 mLs by mouth at bedtime.     . ferrous sulfate 325 (65 FE) MG tablet TAKE 1 TABLET BY MOUTH EVERY DAY WITH BREAKFAST 30 tablet 6  . fluticasone (FLONASE) 50 MCG/ACT nasal spray Place 2 sprays into both nostrils daily. 16 g 3  . furosemide (LASIX) 40 MG tablet Take 1 tablet (40 mg total) by mouth daily. 30 tablet 11  . gabapentin (NEURONTIN) 300 MG capsule Take 2 capsules (600 mg total) by mouth 3 (three) times daily. 180 capsule 1  . hydrALAZINE (APRESOLINE) 25 MG tablet Take 2 tablets in the mornings, 1 tablet in the afternoons, and 1 tablet  in the evenings 120 tablet 11  . Insulin Glargine (LANTUS) 100 UNIT/ML Solostar Pen Injection 0.1 ml (10 units) once daily to control blood sugar. Dx E11.9 15 mL 5  . ipratropium-albuterol (DUONEB) 0.5-2.5 (3) MG/3ML SOLN Take 3 mLs by nebulization every 6 (six) hours as needed (sob). Use 3 times daily x 4  days then every 6 hours as needed. 360 mL 4  . isosorbide mononitrate (IMDUR) 30 MG 24 hr tablet Take 30 mg by mouth daily.    [name] latanoprost (XALATAN) 0.005 % ophthalmic solution Place 1 drop into both eyes at bedtime. 2.5 mL 12  . levalbuterol (XOPENEX HFA) 45 MCG/ACT inhaler Inhale 2 puffs into the lungs every 6 (six) hours as needed for wheezing or shortness of breath. 1 Inhaler 12  . levothyroxine (SYNTHROID, LEVOTHROID) 137 MCG tablet TAKE 1 TABLET (137 MCG TOTAL) BY MOUTH DAILY BEFORE BREAKFAST. 30 tablet 2  . Maltodextrin-Xanthan Gum (RESOURCE THICKENUP CLEAR) POWD Take 120 g by mouth as needed (use to thicken liquids.). 20 Can 0  . mometasone-formoterol (DULERA) 200-5 MCG/ACT AERO Inhale 2 puffs into the lungs 2 (two) times daily. 13 g 6  . Multiple Vitamin (MULTIVITAMIN WITH MINERALS) TABS tablet Take 1 tablet by mouth daily.    [name] NOVOLOG FLEXPEN 100 UNIT/ML FlexPen BEFORE EACH MEAL 3 TIMES DAILY,140-199-2UNITS,200-250-6 UNITS,300-349-8 UNITS, 350 OR ABOVE 10 UNITS 15 pen 6  . nystatin (MYCOSTATIN/NYSTOP) powder Apply topically 3 (three) times daily. 15 g 0  . OXYGEN Inhale 2 L into the lungs at bedtime.    . pantoprazole (PROTONIX) 40 MG tablet Take 1 tablet (40 mg total) by mouth 2 (two) times daily. 60 tablet 1  . Polyethyl Glycol-Propyl Glycol 0.4-0.3 % SOLN Place 1 drop into both eyes 4 (four) times daily.     . RESTASIS 0.05 % ophthalmic emulsion USE 1 DROP INTO BOTH EYES TWICE DAILY 60 mL 3  . sertraline (ZOLOFT) 100 MG tablet Take 100 mg by mouth. Take one tablet each morning for depression    . warfarin (COUMADIN) 2.5 MG tablet TAKE AS DIRECTED BY COUMADIN CLINIC. (Patient taking  differently: 5MG  Mon/Thurs. 3.75MG  on all other days) 50 tablet 1  . Water For Irrigation, Sterile (FREE WATER) SOLN Place 230 mLs into feeding tube 3 (three) times daily.    Cliffton Asters Petrolatum-Mineral Oil (SYSTANE NIGHTTIME) OINT Apply 1 application to eye at bedtime. Both eyes     No current facility-administered medications on file prior to visit.     Allergies  Allergen Reactions  . Penicillins Itching, Rash and Other ([name] Comments)    Tolerated unasyn, zosyn & cephalosporins in the past.  Has patient had a PCN reaction causing immediate rash, facial/tongue/throat swelling, SOB or lightheadedness with hypotension: Yes Has patient had a PCN reaction causing severe rash involving mucus membranes or skin necrosis: Unk Has patient had a PCN reaction that required hospitalization: Unk Has patient had a PCN reaction occurring within the last 10 years: Unk If all of the above answers are "NO", then may proceed with Cephalosporins  . Other     Patient tolerates amoxicillin and cephalosporins     Assessment/Plan:  1. Hypertension: Pt's BP today is at her goal of <150/90 mmHg, at 148/70 mmHg. Continue current hypertension medications as she is feeling much better and her pulse is also within normal limits. Advised pt to continue to monitor her blood pressure at home. Follow-up in HTN in clinic in 4 weeks to ensure BP remains at goal, then f/u as needed.    Julliana Whitmyer L. Marcy Salvo, PharmD, MS PGY1 Pharmacy Resident [facility]

## 2016-12-22 ENCOUNTER — Other Ambulatory Visit: Payer: Self-pay | Admitting: Internal Medicine

## 2016-12-25 ENCOUNTER — Other Ambulatory Visit (HOSPITAL_COMMUNITY): Payer: Self-pay | Admitting: Radiology

## 2016-12-25 ENCOUNTER — Ambulatory Visit (HOSPITAL_COMMUNITY)
Admission: RE | Admit: 2016-12-25 | Discharge: 2016-12-25 | Disposition: A | Discharge status: Home / Self Care | Payer: Medicare Other | Source: Ambulatory Visit | Attending: Radiology | Admitting: Radiology

## 2016-12-25 ENCOUNTER — Encounter (HOSPITAL_COMMUNITY): Payer: Self-pay | Admitting: *Deleted

## 2016-12-25 DIAGNOSIS — R633 Feeding difficulties, unspecified: Secondary | ICD-10-CM

## 2016-12-25 HISTORY — PX: IR PATIENT EVAL TECH 0-60 MINS: IMG5564

## 2016-12-25 MED ORDER — SILVER NITRATE-POT NITRATE 75-25 % EX MISC
CUTANEOUS | Status: AC
  Filled 2016-12-25: qty 1

## 2016-12-25 MED ORDER — LIDOCAINE VISCOUS 2 % MT SOLN
OROMUCOSAL | Status: AC
  Filled 2016-12-25: qty 15

## 2016-12-25 NOTE — Procedures (Signed)
Patient came in today with her daughter complaining of severe pain at the jejunostomy tube insertion site.  The tube appeared slightly tight.  I loosened it and noticed significant granuloma tissue with some bleeding.  [facility] additionally evaluated the site.  An application of silver nitrate was discussed with the patient and the family as a possible resolution to skin pain and bleeding.  The Silver Nitrate was applied to the entire affected area.  The patient tolerated the treatment well.  She and her daughter were advised to call if the issues still persist.

## 2017-01-05 ENCOUNTER — Other Ambulatory Visit (HOSPITAL_COMMUNITY): Payer: Self-pay | Admitting: Interventional Radiology

## 2017-01-05 ENCOUNTER — Ambulatory Visit (HOSPITAL_COMMUNITY)
Admission: RE | Admit: 2017-01-05 | Discharge: 2017-01-05 | Disposition: A | Discharge status: Home / Self Care | Payer: Medicare Other | Source: Ambulatory Visit | Attending: Interventional Radiology | Admitting: Interventional Radiology

## 2017-01-05 ENCOUNTER — Ambulatory Visit (INDEPENDENT_AMBULATORY_CARE_PROVIDER_SITE_OTHER): Payer: Medicare Other

## 2017-01-05 ENCOUNTER — Encounter (HOSPITAL_COMMUNITY): Payer: Self-pay | Admitting: Interventional Radiology

## 2017-01-05 DIAGNOSIS — R633 Feeding difficulties, unspecified: Secondary | ICD-10-CM

## 2017-01-05 DIAGNOSIS — R109 Unspecified abdominal pain: Secondary | ICD-10-CM | POA: Insufficient documentation

## 2017-01-05 DIAGNOSIS — Z5181 Encounter for therapeutic drug level monitoring: Secondary | ICD-10-CM | POA: Diagnosis not present

## 2017-01-05 DIAGNOSIS — Z4659 Encounter for fitting and adjustment of other gastrointestinal appliance and device: Secondary | ICD-10-CM | POA: Insufficient documentation

## 2017-01-05 DIAGNOSIS — I4892 Unspecified atrial flutter: Secondary | ICD-10-CM

## 2017-01-05 DIAGNOSIS — Z434 Encounter for attention to other artificial openings of digestive tract: Secondary | ICD-10-CM | POA: Diagnosis not present

## 2017-01-05 HISTORY — PX: IR CM INJ ANY COLONIC TUBE W/FLUORO: IMG2336

## 2017-01-05 LAB — POCT INR: INR: 1.5

## 2017-01-05 MED ORDER — IOPAMIDOL (ISOVUE-300) INJECTION 61%
50.0000 mL | Freq: Once | INTRAVENOUS | Status: AC | PRN
  Administered 2017-01-05: 30 mL via INTRAVENOUS

## 2017-01-05 MED ORDER — IOPAMIDOL (ISOVUE-300) INJECTION 61%
INTRAVENOUS | Status: AC
  Administered 2017-01-05: 30 mL via INTRAVENOUS
  Filled 2017-01-05: qty 50

## 2017-01-05 NOTE — Procedures (Signed)
Pre procedure Dx: Chronic J-tube, now with abdominal pain. Post Procedure Dx: Same  Appropriately positioned and functioning J-tube.   No exchange performed.   Feeding tube remains ready for immediate use.  Complications: None immediate  Ronny Bacon, MD Pager #: [phone]

## 2017-01-09 ENCOUNTER — Other Ambulatory Visit: Payer: Self-pay | Admitting: Cardiology

## 2017-01-10 ENCOUNTER — Ambulatory Visit (INDEPENDENT_AMBULATORY_CARE_PROVIDER_SITE_OTHER): Payer: Medicare Other | Admitting: Podiatry

## 2017-01-10 DIAGNOSIS — M79676 Pain in unspecified toe(s): Secondary | ICD-10-CM

## 2017-01-10 DIAGNOSIS — B351 Tinea unguium: Secondary | ICD-10-CM

## 2017-01-10 DIAGNOSIS — L989 Disorder of the skin and subcutaneous tissue, unspecified: Secondary | ICD-10-CM

## 2017-01-10 DIAGNOSIS — E0842 Diabetes mellitus due to underlying condition with diabetic polyneuropathy: Secondary | ICD-10-CM | POA: Diagnosis not present

## 2017-01-11 ENCOUNTER — Other Ambulatory Visit: Payer: Self-pay | Admitting: Internal Medicine

## 2017-01-16 NOTE — Progress Notes (Signed)
Subjective: Patient is a 83 y.o. female presenting to the office today with a chief complaint of painful callus lesions to the left foot that have been present for several months.  Patient also complains of elongated, thickened nails that cause pain while ambulating in shoes. Patient is unable to trim their own nails. Patient presents today for further treatment and evaluation.  Past Medical History:  Diagnosis Date  . Acute respiratory failure ([place])   . Anemia    previous blood transfusions  . Arthritis    "all over"  . Asthma   . Bradycardia    requiring previous d/c of BB and reduction of amiodarone  . CAD (coronary artery disease)    nonobstructive per notes  . Chronic diastolic CHF (congestive heart failure) ([place])   . CKD (chronic kidney disease), stage III ([place])   . Complication of blood transfusion    "got the wrong blood type at [facility] in ~ 2015; no adverse reaction that we are aware of"/daughter, [name] (01/27/2016)  . COPD (chronic obstructive pulmonary disease) ([place])   . Depression    "light case"  . DVT (deep venous thrombosis) ([place]) 01/2016   a. LLE DVT 01/2016 - switched from Eliquis to Coumadin.  . Gastric stenosis    a. s/p stomach tube  . GERD (gastroesophageal reflux disease)   . History of blood transfusion    "several" (01/27/2016)  . History of stomach ulcers   . Hyperlipidemia   . Hypertension   . Hypothyroidism   . Paraesophageal hernia   . Perforated gastric ulcer ([place])   . Seasonal allergies   . SIADH (syndrome of inappropriate ADH production) ([place])    [name] 01/10/2015  . Small bowel obstruction ([place])    "I don't know how many" (01/11/2015)  . Stroke ([place])    "light one"  . Type II diabetes mellitus ([place])    "related to prednisone use  for > 20 yr; once predinose stopped; no more DM RX" (01/27/2016)  . UTI (urinary tract infection) 02/08/2016  . Ventral hernia with bowel obstruction     Objective:  Physical Exam General: Alert and  oriented x3 in no acute distress  Dermatology: Hyperkeratotic lesions present on the left foot. Pain on palpation with a central nucleated core noted. Skin is warm, dry and supple bilateral lower extremities. Negative for open lesions or macerations. Nails are tender, long, thickened and dystrophic with subungual debris, consistent with onychomycosis, 1-5 bilateral. No signs of infection noted.  Vascular: Palpable pedal pulses bilaterally. No edema or erythema noted. Capillary refill within normal limits.  Neurological: Epicritic and protective threshold grossly intact bilaterally.   Musculoskeletal Exam: Pain on palpation at the keratotic lesion noted. Range of motion within normal limits bilateral. Muscle strength 5/5 in all groups bilateral.  Assessment: 1. Onychodystrophic nails 1-5 bilateral with hyperkeratosis of nails.  2. Onychomycosis of nail due to dermatophyte bilateral 3. Pre-ulcerative callous 2 to the left foot   Plan of Care:  #1 Patient evaluated. #2 Excisional debridement of keratoic lesion using a chisel blade was performed without incident.  #3 Dressed with light dressing. #4 Mechanical debridement of nails 1-5 bilaterally performed using a nail nipper. Filed with dremel without incident.  #5 Patient is to return to the clinic in 3 months.   Edrick Kins, DPM [facility]  Dr. Edrick Kins, DPM    [address]  [place], [place] [address]                Office [phone]  Fax [phone]   

## 2017-01-18 ENCOUNTER — Ambulatory Visit (INDEPENDENT_AMBULATORY_CARE_PROVIDER_SITE_OTHER): Payer: Medicare Other | Admitting: Pharmacist

## 2017-01-18 ENCOUNTER — Other Ambulatory Visit: Payer: Self-pay | Admitting: Nurse Practitioner

## 2017-01-18 VITALS — BP 152/72 | HR 69

## 2017-01-18 DIAGNOSIS — I10 Essential (primary) hypertension: Secondary | ICD-10-CM | POA: Diagnosis not present

## 2017-01-18 DIAGNOSIS — I4892 Unspecified atrial flutter: Secondary | ICD-10-CM | POA: Diagnosis not present

## 2017-01-18 DIAGNOSIS — K922 Gastrointestinal hemorrhage, unspecified: Secondary | ICD-10-CM

## 2017-01-18 DIAGNOSIS — I70262 Atherosclerosis of native arteries of extremities with gangrene, left leg: Secondary | ICD-10-CM

## 2017-01-18 DIAGNOSIS — Z5181 Encounter for therapeutic drug level monitoring: Secondary | ICD-10-CM | POA: Diagnosis not present

## 2017-01-18 LAB — POCT INR: INR: 1.4

## 2017-01-18 NOTE — Progress Notes (Signed)
Patient ID: Shannon [name]                 DOB: 04/01/1924                      MRN: [id number]     HPI: Shannon Smithis a 83 y.o.femalepatient ofDr. Laurine Blazer presents today for HTN f/u.PMH includes stage III CKD, COPD, HLD, HTN, afib, chronic diastolic HF (EF 60-65%). Pt was seen by Dr. Montez Morita in office on 8/3 for hypertension related headaches following an ED visit on 8/2 after a home BP reading of 209/93. Her Lasix was stopped, and she was discharged with metoprolol tartrate 25 mg. At the office visit, BP was 146/72. She was switched to Coreg 6.25 mg BID. Pt's daughter called into clinic on 8/13 reporting pt's BP high in the mornings 190-202/70s-90s and HR 60-70. Pt also complained of left leg swelling and weight gain of 5 lbs in the last week and a half. Lasix 40 mg daily was started by Dr. Delton See.  At her HTN clinic visit on 11/22/16, pt's blood pressure was elevated at 164/78 mmHg. Hydralazine was adjusted to dose 50 mg in the mornings, 25 mg in the afternoon, and 25 mg in the evenings to provide better BP control for higher morning readings.  Patient presents today for HTN clinic in good spirits with her daughter. She denies dizziness, lightheadedness, or recent falls. She continues to take her blood pressure in the morning one hour after she takes her medicines and also takes her blood pressure in the evening. She feels comfortable continuing her medication regimen and states her head feels better since her blood pressure has come down. Her home BP readings have ranged 120-150s/60-70s, HR in the low 70s. She does have occasional high readings with systolics in the 170s. Her readings remain higher in the morning compared to her evening readings. She had 1 high reading of 197/78 however states she was moving around a bit when she took this reading.  Current HTN meds: Carvedilol 6.25mg  2 tablets (12.5mg )in the mornings and 1 tablet (6.25mg )in the evenings (10 AM and 6 PM) Lasix 40mg  daily (10  AM) Hydralazine 50 mg in the mornings, 25 mg in the afternoon,and 25 mg in evenings (10 AM, 6 PM, 9 PM) Imdur 30 mg daily (10 AM)  Previously tried:Losartan 100 mg daily, lisinopril 10 mg daily, amlodipine 10 mg daily - swelling  BP goal: <150/90 mmHg  Family History: Mother - Stroke, HTN. Brother - Diabetes.   Social History: Reports that she has never smoked. Has quit using smokeless tobacco. Denies alcohol or illicit drug use.   Diet:Liquid diet, feeding tube. No coffee. Has diet ginger ale, some diet Coke.   Exercise:Light walking a few times a week, varies.    Wt Readings from Last 3 Encounters:  12/20/16 151 lb (68.5 kg)  12/12/16 153 lb (69.4 kg)  12/08/16 152 lb (68.9 kg)   BP Readings from Last 3 Encounters:  12/20/16 (!) 148/70  12/12/16 (!) 167/72  12/08/16 124/70   Pulse Readings from Last 3 Encounters:  12/20/16 71  12/12/16 65  12/08/16 72    Renal function: CrCl cannot be calculated (Patient's most recent lab result is older than the maximum 21 days allowed.).  Past Medical History:  Diagnosis Date  . Acute respiratory failure (HCC)   . Anemia    previous blood transfusions  . Arthritis    "all over"  . Asthma   . Bradycardia  requiring previous d/c of BB and reduction of amiodarone  . CAD (coronary artery disease)    nonobstructive per notes  . Chronic diastolic CHF (congestive heart failure) (HCC)   . CKD (chronic kidney disease), stage III (HCC)   . Complication of blood transfusion    "got the wrong blood type at [facility] in ~ 2015; no adverse reaction that we are aware of"/daughter, Shannon [name] (01/27/2016)  . COPD (chronic obstructive pulmonary disease) (HCC)   . Depression    "light case"  . DVT (deep venous thrombosis) (HCC) 01/2016   a. LLE DVT 01/2016 - switched from Eliquis to Coumadin.  . Gastric stenosis    a. s/p stomach tube  . GERD (gastroesophageal reflux disease)   . History of blood transfusion    "several"  (01/27/2016)  . History of stomach ulcers   . Hyperlipidemia   . Hypertension   . Hypothyroidism   . Paraesophageal hernia   . Perforated gastric ulcer (HCC)   . Seasonal allergies   . SIADH (syndrome of inappropriate ADH production) (HCC)    Shannon [name] 01/10/2015  . Small bowel obstruction (HCC)    "I don't know how many" (01/11/2015)  . Stroke (HCC)    "light one"  . Type II diabetes mellitus (HCC)    "related to prednisone use  for > 20 yr; once predinose stopped; no more DM RX" (01/27/2016)  . UTI (urinary tract infection) 02/08/2016  . Ventral hernia with bowel obstruction     Current Outpatient Prescriptions on File Prior to Visit  Medication Sig Dispense Refill  . acetaminophen (TYLENOL) 500 MG tablet Take 1,000 mg by mouth every 6 (six) hours as needed (pain).    . AMINO ACIDS-PROTEIN HYDROLYS PO Take 30 mLs by mouth 2 (two) times daily.    [name] atorvastatin (LIPITOR) 10 MG tablet Take 10 mg by mouth. Take one tablet daily for cholesterol    . atorvastatin (LIPITOR) 10 MG tablet TAKE ONE TABLET BY MOUTH ONCE DAILY 90 tablet 1  . b complex vitamins tablet Take 1 tablet by mouth daily.     . B-D UF III MINI PEN NEEDLES 31G X 5 MM MISC USE AS DIRECTED 100 each 3  . calcium-vitamin D (OSCAL WITH D) 500-200 MG-UNIT per tablet Take 1 tablet by mouth daily with breakfast.     . carvedilol (COREG) 6.25 MG tablet Take 2 tabs po in AM and 1 tab po in PM for blood pressure 90 tablet 6  . clopidogrel (PLAVIX) 75 MG tablet TAKE 1 TABLET (75 MG TOTAL) BY MOUTH DAILY WITH BREAKFAST. 90 tablet 1  . feeding supplement (OSMOLITE 1 CAL) LIQD Take 711 mLs by mouth at bedtime.     . ferrous sulfate 325 (65 FE) MG tablet TAKE 1 TABLET BY MOUTH EVERY DAY WITH BREAKFAST 30 tablet 6  . fluticasone (FLONASE) 50 MCG/ACT nasal spray Place 2 sprays into both nostrils daily. 16 g 3  . furosemide (LASIX) 40 MG tablet Take 1 tablet (40 mg total) by mouth daily. 30 tablet 11  . gabapentin (NEURONTIN) 300 MG capsule  Take 2 capsules (600 mg total) by mouth 3 (three) times daily. 180 capsule 1  . hydrALAZINE (APRESOLINE) 25 MG tablet Take 2 tablets in the mornings, 1 tablet in the afternoons, and 1 tablet in the evenings 120 tablet 11  . Insulin Glargine (LANTUS) 100 UNIT/ML Solostar Pen Injection 0.1 ml (10 units) once daily to control blood sugar. Dx E11.9 15 mL 5  .  ipratropium-albuterol (DUONEB) 0.5-2.5 (3) MG/3ML SOLN Take 3 mLs by nebulization every 6 (six) hours as needed (sob). Use 3 times daily x 4 days then every 6 hours as needed. 360 mL 4  . isosorbide mononitrate (IMDUR) 30 MG 24 hr tablet Take 30 mg by mouth daily.    [name] latanoprost (XALATAN) 0.005 % ophthalmic solution Place 1 drop into both eyes at bedtime. 2.5 mL 12  . levalbuterol (XOPENEX HFA) 45 MCG/ACT inhaler Inhale 2 puffs into the lungs every 6 (six) hours as needed for wheezing or shortness of breath. 1 Inhaler 12  . levothyroxine (SYNTHROID, LEVOTHROID) 137 MCG tablet TAKE 1 TABLET (137 MCG TOTAL) BY MOUTH DAILY BEFORE BREAKFAST. 30 tablet 2  . Maltodextrin-Xanthan Gum (RESOURCE THICKENUP CLEAR) POWD Take 120 g by mouth as needed (use to thicken liquids.). 20 Can 0  . mometasone-formoterol (DULERA) 200-5 MCG/ACT AERO Inhale 2 puffs into the lungs 2 (two) times daily. 13 g 6  . Multiple Vitamin (MULTIVITAMIN WITH MINERALS) TABS tablet Take 1 tablet by mouth daily.    [name] NOVOLOG FLEXPEN 100 UNIT/ML FlexPen BEFORE EACH MEAL 3 TIMES DAILY,140-199-2UNITS,200-250-6 UNITS,300-349-8 UNITS, 350 OR ABOVE 10 UNITS 15 pen 6  . nystatin (MYCOSTATIN/NYSTOP) powder Apply topically 3 (three) times daily. 15 g 0  . OXYGEN Inhale 2 L into the lungs at bedtime.    . pantoprazole (PROTONIX) 40 MG tablet Take 1 tablet (40 mg total) by mouth 2 (two) times daily. 60 tablet 1  . Polyethyl Glycol-Propyl Glycol 0.4-0.3 % SOLN Place 1 drop into both eyes 4 (four) times daily.     . RESTASIS 0.05 % ophthalmic emulsion USE 1 DROP INTO BOTH EYES TWICE DAILY 60 mL 3  .  sertraline (ZOLOFT) 100 MG tablet Take 100 mg by mouth. Take one tablet each morning for depression    . warfarin (COUMADIN) 2.5 MG tablet TAKE AS DIRECTED BY COUMADIN CLINIC. 65 tablet 1  . Water For Irrigation, Sterile (FREE WATER) SOLN Place 230 mLs into feeding tube 3 (three) times daily.    Cliffton Asters Petrolatum-Mineral Oil (SYSTANE NIGHTTIME) OINT Apply 1 application to eye at bedtime. Both eyes     No current facility-administered medications on file prior to visit.     Allergies  Allergen Reactions  . Penicillins Itching, Rash and Other (See Comments)    Tolerated unasyn, zosyn & cephalosporins in the past.  Has patient had a PCN reaction causing immediate rash, facial/tongue/throat swelling, SOB or lightheadedness with hypotension: Yes Has patient had a PCN reaction causing severe rash involving mucus membranes or skin necrosis: Unk Has patient had a PCN reaction that required hospitalization: Unk Has patient had a PCN reaction occurring within the last 10 years: Unk If all of the above answers are "NO", then may proceed with Cephalosporins  . Other     Patient tolerates amoxicillin and cephalosporins     Assessment/Plan:  1. Hypertension - BP is very close to goal <150/6mmHg given advanced age and limited mobility. Will continue current medications of carvedilol 12.5mg  AM and 6.25mg  PM, hydralazine 50mg  AM, 25mg  lunch, and 25mg  PM. Higher AM dosing is helping to keep AM BP readings down. Did encourage pt to check her AM BP 2 hours after taking medications rather than 1 hour after. Pt will keep f/u with Dr Delton See next week as scheduled. F/u in HTN clinic as needed.   Lilleigh Hechavarria E. Supple, PharmD, CPP, BCACP [facility] [address] [place], [place] [address] Phone: [phone])  [phone]; Fax: [phone] 01/18/2017 12:12 PM

## 2017-01-18 NOTE — Patient Instructions (Addendum)
It was nice to see you today  Your blood pressure goal is less than 150/90  Continue to take all of your blood pressure medications  Check your blood pressure in the morning 2 hours after you take your morning medications  Follow up with Dr Meda Coffee next week as scheduled

## 2017-01-19 ENCOUNTER — Ambulatory Visit (INDEPENDENT_AMBULATORY_CARE_PROVIDER_SITE_OTHER): Payer: Medicare Other | Admitting: Nurse Practitioner

## 2017-01-19 ENCOUNTER — Encounter: Payer: Self-pay | Admitting: Nurse Practitioner

## 2017-01-19 VITALS — BP 138/66 | HR 62 | Temp 97.7°F | Ht 66.0 in | Wt 160.0 lb

## 2017-01-19 DIAGNOSIS — I70262 Atherosclerosis of native arteries of extremities with gangrene, left leg: Secondary | ICD-10-CM | POA: Diagnosis not present

## 2017-01-19 DIAGNOSIS — Z23 Encounter for immunization: Secondary | ICD-10-CM

## 2017-01-19 DIAGNOSIS — B370 Candidal stomatitis: Secondary | ICD-10-CM | POA: Diagnosis not present

## 2017-01-19 DIAGNOSIS — I5032 Chronic diastolic (congestive) heart failure: Secondary | ICD-10-CM

## 2017-01-19 MED ORDER — NYSTATIN 100000 UNIT/ML MT SUSP: OROMUCOSAL | 0 refills | Status: DC

## 2017-01-19 NOTE — Progress Notes (Signed)
Careteam: Patient Care Team: Gildardo Cranker, DO as PCP - General (Internal Medicine) Edrick Kins, DPM as Consulting Physician (Podiatry) Dorothy Spark, MD as Consulting Physician (Cardiology)   Allergies  Allergen Reactions  . Penicillins Itching, Rash and Other (See Comments)    Tolerated unasyn, zosyn & cephalosporins in the past.  Has patient had a PCN reaction causing immediate rash, facial/tongue/throat swelling, SOB or lightheadedness with hypotension: Yes Has patient had a PCN reaction causing severe rash involving mucus membranes or skin necrosis: Unk Has patient had a PCN reaction that required hospitalization: Unk Has patient had a PCN reaction occurring within the last 10 years: Unk If all of the above answers are "NO", then may proceed with Cephalosporins  . Other     Patient tolerates amoxicillin and cephalosporins    Chief Complaint  Patient presents with  . Acute Visit    Trush onset a yesterday, patient's mouth feeling funny for a few days. Patient also with weight gain.   . Immunizations    Flu vaccine today if ok with current symptoms      HPI: Patient is a 83 y.o. female seen in the office today due to possible thrush.  Drinks most of her nutritio, everything through PEG tube. Just noticed something white in her mouth yesterday.  The mouth has been a little sore and tender. Has been using mouthwash and thought this would help. Pt reports this has been going on for a while. When her daughter looked at first she did not see anything abnormal.  Uses inhalers for COPD and does not rinse her mouth.   Weight up- started wheezing more and more short of breath.  More edema to LE. Unable to lay flat due to CHF  Weights daily at home and it has been going up, planning to call cardiology due to this.  Would prefer to call cardiologist in regards to increasing her lasix.   Review of Systems:  Review of Systems  Constitutional: Negative for chills, fever  and malaise/fatigue.  HENT: Positive for sore throat. Negative for tinnitus.        Mouth pain, trouble swallowing   Eyes: Negative for blurred vision and double vision.  Respiratory: Negative for cough, shortness of breath and wheezing.   Cardiovascular: Positive for leg swelling. Negative for chest pain and palpitations.  Gastrointestinal: Positive for abdominal pain (chronic). Negative for constipation, diarrhea, heartburn, nausea and vomiting.       Blood around PEG site and occasional dark stools.   Skin: Negative for rash.  Neurological: Negative for focal weakness and weakness.    Past Medical History:  Diagnosis Date  . Acute respiratory failure ([place])   . Anemia    previous blood transfusions  . Arthritis    "all over"  . Asthma   . Bradycardia    requiring previous d/c of BB and reduction of amiodarone  . CAD (coronary artery disease)    nonobstructive per notes  . Chronic diastolic CHF (congestive heart failure) ([place])   . CKD (chronic kidney disease), stage III ([place])   . Complication of blood transfusion    "got the wrong blood type at [facility] in ~ 2015; no adverse reaction that we are aware of"/daughter, [name] (01/27/2016)  . COPD (chronic obstructive pulmonary disease) ([place])   . Depression    "light case"  . DVT (deep venous thrombosis) ([place]) 01/2016   a. LLE DVT 01/2016 - switched from Eliquis to Coumadin.  . Gastric  stenosis    a. s/p stomach tube  . GERD (gastroesophageal reflux disease)   . History of blood transfusion    "several" (01/27/2016)  . History of stomach ulcers   . Hyperlipidemia   . Hypertension   . Hypothyroidism   . Paraesophageal hernia   . Perforated gastric ulcer ([place])   . Seasonal allergies   . SIADH (syndrome of inappropriate ADH production) ([place])    [name] 01/10/2015  . Small bowel obstruction ([place])    "I don't know how many" (01/11/2015)  . Stroke ([place])    "light one"  . Type II diabetes mellitus ([place])    "related to  prednisone use  for > 20 yr; once predinose stopped; no more DM RX" (01/27/2016)  . UTI (urinary tract infection) 02/08/2016  . Ventral hernia with bowel obstruction    Past Surgical History:  Procedure Laterality Date  . ABDOMINAL AORTOGRAM N/A 09/21/2016   Procedure: Abdominal Aortogram;  Surgeon: Waynetta Sandy, MD;  Location: [facility];  Service: Cardiovascular;  Laterality: N/A;  . AMPUTATION Left 09/25/2016   Procedure: AMPUTATION DIGIT- LEFT 2ND AND 3RD TOES;  Surgeon: Rosetta Posner, MD;  Location: [facility];  Service: Vascular;  Laterality: Left;  . CATARACT EXTRACTION W/ INTRAOCULAR LENS  IMPLANT, BILATERAL    . CHOLECYSTECTOMY OPEN    . COLECTOMY    . ESOPHAGOGASTRODUODENOSCOPY N/A 01/19/2014   Procedure: ESOPHAGOGASTRODUODENOSCOPY (EGD);  Surgeon: Irene Shipper, MD;  Location: [facility];  Service: Endoscopy;  Laterality: N/A;  . ESOPHAGOGASTRODUODENOSCOPY N/A 01/20/2014   Procedure: ESOPHAGOGASTRODUODENOSCOPY (EGD);  Surgeon: Irene Shipper, MD;  Location: [facility];  Service: Endoscopy;  Laterality: N/A;  . ESOPHAGOGASTRODUODENOSCOPY N/A 03/19/2014   Procedure: ESOPHAGOGASTRODUODENOSCOPY (EGD);  Surgeon: Milus Banister, MD;  Location: [facility];  Service: Endoscopy;  Laterality: N/A;  . ESOPHAGOGASTRODUODENOSCOPY N/A 07/08/2015   Procedure: ESOPHAGOGASTRODUODENOSCOPY (EGD);  Surgeon: Doran Stabler, MD;  [facility];  Service: Endoscopy;  Laterality: N/A;  . ESOPHAGOGASTRODUODENOSCOPY (EGD) WITH PROPOFOL N/A 09/15/2015   Procedure: ESOPHAGOGASTRODUODENOSCOPY (EGD) WITH PROPOFOL;  Surgeon: Manus Gunning, MD;  Location: [facility];  Service: Gastroenterology;  Laterality: N/A;  . GASTROJEJUNOSTOMY     hx/notes 01/10/2015  . GASTROJEJUNOSTOMY N/A 09/23/2015   Procedure: OPEN GASTROJEJUNOSTOMY TUBE PLACEMENT;  Surgeon: Arta Bruce Kinsinger, MD;  Location: [facility];  Service: General;  Laterality: N/A;  . GLAUCOMA SURGERY Bilateral   . HERNIA  REPAIR  2015  . IR CM INJ ANY COLONIC TUBE W/FLUORO  01/05/2017  . IR GENERIC HISTORICAL  01/07/2016   IR GJ TUBE CHANGE 01/07/2016 Jacqulynn Cadet, MD [facility]  . IR GENERIC HISTORICAL  01/27/2016   IR MECH REMOV OBSTRUC MAT ANY COLON TUBE W/FLUORO 01/27/2016 Markus Daft, MD [facility]  . IR GENERIC HISTORICAL  02/07/2016   IR PATIENT EVAL TECH 0-60 MINS Darrell K Allred, PA-C [facility]  . IR GENERIC HISTORICAL  02/08/2016   IR GJ TUBE CHANGE 02/08/2016 Greggory Keen, MD [facility]  . IR GENERIC HISTORICAL  01/06/2016   IR GJ TUBE CHANGE 01/06/2016 [facility] OUT REF  . IR GENERIC HISTORICAL  05/02/2016   IR CM INJ ANY COLONIC TUBE W/FLUORO 05/02/2016 Arne Cleveland, MD [facility]  . IR GENERIC HISTORICAL  05/15/2016   IR GJ TUBE CHANGE 05/15/2016 Sandi Mariscal, MD [facility]  . IR GENERIC HISTORICAL  06/28/2016   IR GJ TUBE CHANGE 06/28/2016 [facility]  . IR PATIENT EVAL TECH 0-60 MINS  10/19/2016  .  IR PATIENT EVAL TECH 0-60 MINS  12/25/2016  . IR REPLC DUODEN/JEJUNO TUBE PERCUT W/FLUORO  11/14/2016  . LAPAROTOMY N/A 01/20/2015   Procedure: EXPLORATORY LAPAROTOMY;  Surgeon: Coralie Keens, MD;  Location: [facility];  Service: General;  Laterality: N/A;  . LOWER EXTREMITY ANGIOGRAPHY Left 09/21/2016   Procedure: Lower Extremity Angiography;  Surgeon: Waynetta Sandy, MD;  Location: [facility];  Service: Cardiovascular;  Laterality: Left;  . LYSIS OF ADHESION N/A 01/20/2015   Procedure: LYSIS OF ADHESIONS < 1 HOUR;  Surgeon: Coralie Keens, MD;  Location: [facility];  Service: General;  Laterality: N/A;  . PERIPHERAL VASCULAR BALLOON ANGIOPLASTY Left 09/21/2016   Procedure: Peripheral Vascular Balloon Angioplasty;  Surgeon: Waynetta Sandy, MD;  Location: [facility];  Service: Cardiovascular;  Laterality: Left;  drug coated balloon  . TONSILLECTOMY    . TUBAL LIGATION    . VENTRAL HERNIA REPAIR  2015   incarcerated ventral hernia ([facility] 09/2013)/notes  01/10/2015   Social History:   reports that she has never smoked. She has quit using smokeless tobacco. Her smokeless tobacco use included Snuff. She reports that she does not drink alcohol or use drugs.  Family History  Problem Relation Age of Onset  . Stroke Mother   . Hypertension Mother   . Diabetes Brother   . Glaucoma Son   . Glaucoma Son   . Heart attack Neg Hx     Medications: Patient's Medications  New Prescriptions   No medications on file  Previous Medications   ACETAMINOPHEN (TYLENOL) 500 MG TABLET    Take 1,000 mg by mouth every 6 (six) hours as needed (pain).   AMINO ACIDS-PROTEIN HYDROLYS PO    Take 30 mLs by mouth 2 (two) times daily.   ATORVASTATIN (LIPITOR) 10 MG TABLET    TAKE ONE TABLET BY MOUTH ONCE DAILY   B COMPLEX VITAMINS TABLET    Take 1 tablet by mouth daily.    B-D UF III MINI PEN NEEDLES 31G X 5 MM MISC    USE AS DIRECTED   CALCIUM-VITAMIN D (OSCAL WITH D) 500-200 MG-UNIT PER TABLET    Take 1 tablet by mouth daily with breakfast.    CARVEDILOL (COREG) 6.25 MG TABLET    Take 2 tabs po in AM and 1 tab po in PM for blood pressure   FEEDING SUPPLEMENT (OSMOLITE 1 CAL) LIQD    Take 711 mLs by mouth at bedtime.    FERROUS SULFATE 325 (65 FE) MG TABLET    TAKE 1 TABLET BY MOUTH EVERY DAY WITH BREAKFAST   FLUTICASONE (FLONASE) 50 MCG/ACT NASAL SPRAY    Place 2 sprays into both nostrils daily.   FUROSEMIDE (LASIX) 40 MG TABLET    Take 1 tablet (40 mg total) by mouth daily.   GABAPENTIN (NEURONTIN) 300 MG CAPSULE    Take 2 capsules (600 mg total) by mouth 3 (three) times daily.   HYDRALAZINE (APRESOLINE) 25 MG TABLET    Take 2 tablets in the mornings, 1 tablet in the afternoons, and 1 tablet in the evenings   INSULIN GLARGINE (LANTUS) 100 UNIT/ML SOLOSTAR PEN    Injection 0.1 ml (10 units) once daily to control blood sugar. Dx E11.9   IPRATROPIUM-ALBUTEROL (DUONEB) 0.5-2.5 (3) MG/3ML SOLN    Take 3 mLs by nebulization every 6 (six) hours as needed (sob). Use 3  times daily x 4 days then every 6 hours as needed.   ISOSORBIDE MONONITRATE (IMDUR) 30 MG 24 HR  TABLET    Take 30 mg by mouth daily.   LATANOPROST (XALATAN) 0.005 % OPHTHALMIC SOLUTION    Place 1 drop into both eyes at bedtime.   LEVALBUTEROL (XOPENEX HFA) 45 MCG/ACT INHALER    Inhale 2 puffs into the lungs every 6 (six) hours as needed for wheezing or shortness of breath.   LEVOTHYROXINE (SYNTHROID, LEVOTHROID) 137 MCG TABLET    TAKE 1 TABLET (137 MCG TOTAL) BY MOUTH DAILY BEFORE BREAKFAST.   MALTODEXTRIN-XANTHAN GUM (RESOURCE THICKENUP CLEAR) POWD    Take 120 g by mouth as needed (use to thicken liquids.).   MOMETASONE-FORMOTEROL (DULERA) 200-5 MCG/ACT AERO    Inhale 2 puffs into the lungs 2 (two) times daily.   MULTIPLE VITAMIN (MULTIVITAMIN WITH MINERALS) TABS TABLET    Take 1 tablet by mouth daily.   NOVOLOG FLEXPEN 100 UNIT/ML FLEXPEN    BEFORE EACH MEAL 3 TIMES DAILY,140-199-2UNITS,200-250-6 UNITS,300-349-8 UNITS, 350 OR ABOVE 10 UNITS   NYSTATIN (MYCOSTATIN/NYSTOP) POWDER    Apply topically 3 (three) times daily.   OXYGEN    Inhale 2 L into the lungs at bedtime.   PANTOPRAZOLE (PROTONIX) 40 MG TABLET    TAKE 1 TABLET BY MOUTH TWICE A DAY   POLYETHYL GLYCOL-PROPYL GLYCOL 0.4-0.3 % SOLN    Place 1 drop into both eyes 4 (four) times daily.    RESTASIS 0.05 % OPHTHALMIC EMULSION    USE 1 DROP INTO BOTH EYES TWICE DAILY   SERTRALINE (ZOLOFT) 100 MG TABLET    Take 100 mg by mouth. Take one tablet each morning for depression   WARFARIN (COUMADIN) 2.5 MG TABLET    TAKE AS DIRECTED BY COUMADIN CLINIC.   WATER FOR IRRIGATION, STERILE (FREE WATER) SOLN    Place 230 mLs into feeding tube 3 (three) times daily.   WHITE PETROLATUM-MINERAL OIL (SYSTANE NIGHTTIME) OINT    Apply 1 application to eye at bedtime. Both eyes  Modified Medications   No medications on file  Discontinued Medications   ATORVASTATIN (LIPITOR) 10 MG TABLET    Take 10 mg by mouth. Take one tablet daily for cholesterol      Physical Exam:  Vitals:   01/19/17 1117  BP: 138/66  Pulse: 62  Temp: 97.7 F (36.5 C)  TempSrc: Oral  SpO2: 97%  Weight: 160 lb (72.6 kg)  Height: 5\' 6"  (1.676 m)   Body mass index is 25.82 kg/m.  Physical Exam  Constitutional: She is oriented to person, place, and time. She appears well-developed and well-nourished.  Frail appearing in NAD  HENT:  White patches noted throughout mouth  Eyes: Pupils are equal, round, and reactive to light. No scleral icterus.  Neck: Neck supple. Carotid bruit is not present. No tracheal deviation present.  Cardiovascular: Normal rate, regular rhythm and intact distal pulses.  Exam reveals no gallop and no friction rub.   Murmur (1/6 SEM) heard. Pulmonary/Chest: Effort normal and breath sounds normal. No stridor. No respiratory distress. She has no wheezes. She has no rales.  Abdominal: Soft. Normal appearance and bowel sounds are normal. She exhibits no mass. There is no hepatomegaly. There is tenderness. There is no rigidity.  .   Musculoskeletal: She exhibits edema (bilateral LE).  Lymphadenopathy:    She has no cervical adenopathy.  Neurological: She is alert and oriented to person, place, and time.  Skin: Skin is warm and dry. No rash noted.  Psychiatric: She has a normal mood and affect. Her behavior is normal. Thought content normal.  Labs reviewed: Basic Metabolic Panel:  Recent Labs  06/08/16 1523  07/21/16 1124  08/15/16 0545 08/16/16 8937  08/18/16 0517  09/25/16 0524 09/26/16 0435 10/03/16 1610 11/02/16 1710  NA 135  < >  --   < > 136 141  < > 135  < > 136 133* 134* 133*  K 4.4  < >  --   < > 3.3* 4.2  < > 4.0  < > 4.6 4.4 4.7 4.4  CL 99*  < >  --   < > 102 107  < > 101  < > 105 103 97* 98*  CO2 29  < >  --   < > 23 25  < > 24  < > 25 25 27 29   GLUCOSE 148*  < >  --   < > 451* 350*  < > 154*  < > 106* 232* 102* 110*  BUN 54*  < >  --   < > 64* 54*  < > 49*  < > 32* 35* 32* 32*  CREATININE 1.97*  < >  --   <  > 1.11* 1.04*  < > 0.96  < > 1.03* 1.22* 1.10* 1.01*  CALCIUM 8.7*  < >  --   < > 9.3 9.4  < > 9.1  < > 8.7* 8.3* 9.1 9.7  MG 1.9  < >  --   < > 2.1 1.9  --  1.9  --   --   --   --   --   PHOS  --   < >  --   < > 2.4* 2.2*  --   --   --  3.5  --   --   --   TSH 10.017*  --  4.97*  --   --   --   --  0.670  --   --   --   --   --   < > = values in this interval not displayed. Liver Function Tests:  Recent Labs  09/26/16 0435 10/03/16 1610 11/02/16 1710  AST 25 20 24   ALT 31 25 19   ALKPHOS 57 73 58  BILITOT 0.4 0.4 0.5  PROT 5.4* 7.1 7.0  ALBUMIN 2.0* 3.2* 3.5   No results for input(s): LIPASE, AMYLASE in the last 8760 hours. No results for input(s): AMMONIA in the last 8760 hours. CBC:  Recent Labs  09/26/16 0435 10/03/16 1610 11/02/16 1710 11/15/16 1418  WBC 11.4* 5.8 8.0 5.5  NEUTROABS 8.5* 3,190  --  2,420  HGB 8.0* 10.5* 11.1* 10.9*  HCT 25.1* 33.2* 32.8* 33.8*  MCV 89.6 88.5 83.9 89.9  PLT 263 358 231 242   Lipid Panel: No results for input(s): CHOL, HDL, LDLCALC, TRIG, CHOLHDL, LDLDIRECT in the last 8760 hours. TSH:  Recent Labs  06/08/16 1523 07/21/16 1124 08/18/16 0517  TSH 10.017* 4.97* 0.670   A1C: Lab Results  Component Value Date   HGBA1C 7.7 (H) 08/16/2016     Assessment/Plan 1. Oral thrush - nystatin (MYCOSTATIN) 100000 UNIT/ML suspension; 5 ml swish and swallow 4 times daily for 1 week then as needed until resolves  Dispense: 240 mL; Refill: 0 -pt not rinsing her mouth out after her dulera, educated her on proper mouth care.   2. Chronic diastolic CHF (congestive heart failure) ([place]) Weight gain noted, daughter would like cardiologist to address weight gain due to CHF.   3. Need for immunization against influenza - Flu vaccine HIGH DOSE  PF (Fluzone High dose)  Next appt: as scheduled, sooner if needed  Jessica K. Harle Battiest  [facility] [phone] 8 am - 5 pm) [phone] (after  hours)

## 2017-01-19 NOTE — Patient Instructions (Signed)
Nystatin has been called to the pharmacy to take 4 times daily for 1 week then as needed

## 2017-01-22 ENCOUNTER — Telehealth: Payer: Self-pay | Admitting: *Deleted

## 2017-01-22 ENCOUNTER — Telehealth: Payer: Self-pay | Admitting: Cardiology

## 2017-01-22 NOTE — Telephone Encounter (Signed)
Left message for patient to call back  

## 2017-01-22 NOTE — Telephone Encounter (Signed)
Shannon [name] ( daughter on [place]) is calling because [name] starting to vomit twice on yesterday and 3am this morning . Her weight has gone from 151 to 157 lbs. Weight has been Fluctuating from day to day and she is on lasix 40 mg a day . She is concerned about the CHF . Please call

## 2017-01-22 NOTE — Telephone Encounter (Signed)
Patient caregiver, [name] called and stated that patient was seen on Friday for Shannon [name] and prescribed Nystatin. Saturday patient started vomiting and hasn't been able to keep anything down. Caregiver is wondering if that is a reaction to the Nystatin prescribed. No other symptoms noted. Please Advise.

## 2017-01-22 NOTE — Telephone Encounter (Signed)
Stop nystatin and see if this helps resolve symptoms.  If improves may just swish and spit nystatin to see if this helps with thrush in mouth

## 2017-01-23 NOTE — Telephone Encounter (Signed)
[name] notified and agreed.  °

## 2017-01-24 ENCOUNTER — Ambulatory Visit (INDEPENDENT_AMBULATORY_CARE_PROVIDER_SITE_OTHER): Payer: Medicare Other | Admitting: *Deleted

## 2017-01-24 ENCOUNTER — Ambulatory Visit (INDEPENDENT_AMBULATORY_CARE_PROVIDER_SITE_OTHER): Payer: Medicare Other | Admitting: Cardiology

## 2017-01-24 ENCOUNTER — Encounter: Payer: Self-pay | Admitting: Cardiology

## 2017-01-24 VITALS — BP 132/60 | HR 73 | Ht 66.0 in | Wt 157.0 lb

## 2017-01-24 DIAGNOSIS — I70262 Atherosclerosis of native arteries of extremities with gangrene, left leg: Secondary | ICD-10-CM

## 2017-01-24 DIAGNOSIS — I10 Essential (primary) hypertension: Secondary | ICD-10-CM | POA: Diagnosis not present

## 2017-01-24 DIAGNOSIS — I4892 Unspecified atrial flutter: Secondary | ICD-10-CM | POA: Diagnosis not present

## 2017-01-24 DIAGNOSIS — Z5181 Encounter for therapeutic drug level monitoring: Secondary | ICD-10-CM | POA: Diagnosis not present

## 2017-01-24 DIAGNOSIS — R001 Bradycardia, unspecified: Secondary | ICD-10-CM

## 2017-01-24 DIAGNOSIS — I5033 Acute on chronic diastolic (congestive) heart failure: Secondary | ICD-10-CM | POA: Diagnosis not present

## 2017-01-24 DIAGNOSIS — I824Y2 Acute embolism and thrombosis of unspecified deep veins of left proximal lower extremity: Secondary | ICD-10-CM | POA: Diagnosis not present

## 2017-01-24 LAB — POCT INR: INR: 1.6

## 2017-01-24 NOTE — Telephone Encounter (Signed)
Pt saw Dr Meda Coffee in the clinic today.  No med changes made.  Pt scheduled for 3 month follow-up with Dr Meda Coffee.

## 2017-01-24 NOTE — Patient Instructions (Signed)
Medication Instructions:   Your physician recommends that you continue on your current medications as directed. Please refer to the Current Medication list given to you today.     Follow-Up:  3 MONTHS WITH DR NELSON       If you need a refill on your cardiac medications before your next appointment, please call your pharmacy.   

## 2017-01-24 NOTE — Progress Notes (Signed)
Patient ID: Shannon [name]                 DOB: Nov 14, 1923                      MRN: [id number]     HPI: Shannon Smithis a 83 y.o.femalepatient ofDr. Laurine Blazer presents today for HTN f/u.PMH includes stage III CKD, COPD, HLD, HTN, afib, chronic diastolic HF (EF 60-65%). Pt was seen by Dr. Montez Morita in office on 8/3 for hypertension related headaches following an ED visit on 8/2 after a home BP reading of 209/93. Her Lasix was stopped, and she was discharged with metoprolol tartrate 25 mg. At the office visit, BP was 146/72. She was switched to Coreg 6.25 mg BID. Pt's daughter called into clinic on 8/13 reporting pt's BP high in the mornings 190-202/70s-90s and HR 60-70. Pt also complained of left leg swelling and weight gain of 5 lbs in the last week and a half. Lasix 40 mg daily was started by Dr. Delton See.  At her HTN clinic visit on 11/22/16, pt's blood pressure was elevated at 164/78 mmHg. Hydralazine was adjusted to dose 50 mg in the mornings, 25 mg in the afternoon, and 25 mg in the evenings to provide better BP control for higher morning readings.  Patient presents today for HTN clinic in good spirits with her daughter. She denies dizziness, lightheadedness, or recent falls. She continues to take her blood pressure in the morning one hour after she takes her medicines and also takes her blood pressure in the evening. She feels comfortable continuing her medication regimen and states her head feels better since her blood pressure has come down. Her home BP readings have ranged 120-150s/60-70s, HR in the low 70s. She does have occasional high readings with systolics in the 170s. Her readings remain higher in the morning compared to her evening readings. She had 1 high reading of 197/78 however states she was moving around a bit when she took this reading.  01/24/2017 - 3 months follow-up, the patient denies any lower extremity edema chest pain or shortness of breath, no orthopnea paroxysmal nocturnal  dyspnea. She's been experiencing abdominal pain that is constant and worsening with free water which is used for flushing her feeding tube. She denies any bleeding. Her INR has been persistently low. She doesn't eat any solid food, only liquids and then she gets feeding the feeding tube at night.  Current HTN meds: Carvedilol 6.25mg  2 tablets (12.5mg )in the mornings and 1 tablet (6.25mg )in the evenings (10 AM and 6 PM) Lasix 40mg  daily (10 AM) Hydralazine 50 mg in the mornings, 25 mg in the afternoon,and 25 mg in evenings (10 AM, 6 PM, 9 PM) Imdur 30 mg daily (10 AM)  Previously tried:Losartan 100 mg daily, lisinopril 10 mg daily, amlodipine 10 mg daily - swelling  BP goal: <150/90 mmHg  Family History: Mother - Stroke, HTN. Brother - Diabetes.   Social History: Reports that she has never smoked. Has quit using smokeless tobacco. Denies alcohol or illicit drug use.   Diet:Liquid diet, feeding tube. No coffee. Has diet ginger ale, some diet Coke.   Exercise:Light walking a few times a week, varies.    Wt Readings from Last 3 Encounters:  01/24/17 157 lb (71.2 kg)  01/19/17 160 lb (72.6 kg)  12/20/16 151 lb (68.5 kg)   BP Readings from Last 3 Encounters:  01/24/17 132/60  01/19/17 138/66  01/18/17 (!) 152/72   Pulse Readings from Last  3 Encounters:  01/24/17 73  01/19/17 62  01/18/17 69    Renal function: CrCl cannot be calculated (Patient's most recent lab result is older than the maximum 21 days allowed.).  Past Medical History:  Diagnosis Date  . Acute respiratory failure (HCC)   . Anemia    previous blood transfusions  . Arthritis    "all over"  . Asthma   . Bradycardia    requiring previous d/c of BB and reduction of amiodarone  . CAD (coronary artery disease)    nonobstructive per notes  . Chronic diastolic CHF (congestive heart failure) (HCC)   . CKD (chronic kidney disease), stage III (HCC)   . Complication of blood transfusion    "got the  wrong blood type at [facility] in ~ 2015; no adverse reaction that we are aware of"/daughter, [name] (01/27/2016)  . COPD (chronic obstructive pulmonary disease) (HCC)   . Depression    "light case"  . DVT (deep venous thrombosis) (HCC) 01/2016   a. LLE DVT 01/2016 - switched from Eliquis to Coumadin.  . Gastric stenosis    a. s/p stomach tube  . GERD (gastroesophageal reflux disease)   . History of blood transfusion    "several" (01/27/2016)  . History of stomach ulcers   . Hyperlipidemia   . Hypertension   . Hypothyroidism   . Paraesophageal hernia   . Perforated gastric ulcer (HCC)   . Seasonal allergies   . SIADH (syndrome of inappropriate ADH production) (HCC)    [name] 01/10/2015  . Small bowel obstruction (HCC)    "I don't know how many" (01/11/2015)  . Stroke (HCC)    "light one"  . Type II diabetes mellitus (HCC)    "related to prednisone use  for > 20 yr; once predinose stopped; no more DM RX" (01/27/2016)  . UTI (urinary tract infection) 02/08/2016  . Ventral hernia with bowel obstruction     Current Outpatient Prescriptions on File Prior to Visit  Medication Sig Dispense Refill  . acetaminophen (TYLENOL) 500 MG tablet Take 1,000 mg by mouth every 6 (six) hours as needed (pain).    . AMINO ACIDS-PROTEIN HYDROLYS PO Take 30 mLs by mouth 2 (two) times daily.    [name] atorvastatin (LIPITOR) 10 MG tablet TAKE ONE TABLET BY MOUTH ONCE DAILY 90 tablet 1  . b complex vitamins tablet Take 1 tablet by mouth daily.     . B-D UF III MINI PEN NEEDLES 31G X 5 MM MISC USE AS DIRECTED 100 each 3  . calcium-vitamin D (OSCAL WITH D) 500-200 MG-UNIT per tablet Take 1 tablet by mouth daily with breakfast.     . carvedilol (COREG) 6.25 MG tablet Take 2 tabs po in AM and 1 tab po in PM for blood pressure 90 tablet 6  . feeding supplement (OSMOLITE 1 CAL) LIQD Take 711 mLs by mouth at bedtime.     . ferrous sulfate 325 (65 FE) MG tablet TAKE 1 TABLET BY MOUTH EVERY DAY WITH BREAKFAST 30 tablet  6  . fluticasone (FLONASE) 50 MCG/ACT nasal spray Place 2 sprays into both nostrils daily. 16 g 3  . furosemide (LASIX) 40 MG tablet Take 1 tablet (40 mg total) by mouth daily. 30 tablet 11  . gabapentin (NEURONTIN) 300 MG capsule Take 2 capsules (600 mg total) by mouth 3 (three) times daily. 180 capsule 1  . hydrALAZINE (APRESOLINE) 25 MG tablet Take 2 tablets in the mornings, 1 tablet in the afternoons, and 1 tablet  in the evenings 120 tablet 11  . Insulin Glargine (LANTUS) 100 UNIT/ML Solostar Pen Injection 0.1 ml (10 units) once daily to control blood sugar. Dx E11.9 15 mL 5  . ipratropium-albuterol (DUONEB) 0.5-2.5 (3) MG/3ML SOLN Take 3 mLs by nebulization every 6 (six) hours as needed (sob). Use 3 times daily x 4 days then every 6 hours as needed. 360 mL 4  . isosorbide mononitrate (IMDUR) 30 MG 24 hr tablet Take 30 mg by mouth daily.    [name] latanoprost (XALATAN) 0.005 % ophthalmic solution Place 1 drop into both eyes at bedtime. 2.5 mL 12  . levalbuterol (XOPENEX HFA) 45 MCG/ACT inhaler Inhale 2 puffs into the lungs every 6 (six) hours as needed for wheezing or shortness of breath. 1 Inhaler 12  . levothyroxine (SYNTHROID, LEVOTHROID) 137 MCG tablet TAKE 1 TABLET (137 MCG TOTAL) BY MOUTH DAILY BEFORE BREAKFAST. 30 tablet 2  . Maltodextrin-Xanthan Gum (RESOURCE THICKENUP CLEAR) POWD Take 120 g by mouth as needed (use to thicken liquids.). 20 Can 0  . mometasone-formoterol (DULERA) 200-5 MCG/ACT AERO Inhale 2 puffs into the lungs 2 (two) times daily. 13 g 6  . Multiple Vitamin (MULTIVITAMIN WITH MINERALS) TABS tablet Take 1 tablet by mouth daily.    [name] NOVOLOG FLEXPEN 100 UNIT/ML FlexPen BEFORE EACH MEAL 3 TIMES DAILY,140-199-2UNITS,200-250-6 UNITS,300-349-8 UNITS, 350 OR ABOVE 10 UNITS 15 pen 6  . nystatin (MYCOSTATIN) 100000 UNIT/ML suspension 5 ml swish and swallow 4 times daily for 1 week then as needed until resolves 240 mL 0  . nystatin (MYCOSTATIN/NYSTOP) powder Apply topically 3 (three)  times daily. 15 g 0  . OXYGEN Inhale 2 L into the lungs at bedtime.    . pantoprazole (PROTONIX) 40 MG tablet TAKE 1 TABLET BY MOUTH TWICE A DAY 60 tablet 1  . Polyethyl Glycol-Propyl Glycol 0.4-0.3 % SOLN Place 1 drop into both eyes 4 (four) times daily.     . RESTASIS 0.05 % ophthalmic emulsion USE 1 DROP INTO BOTH EYES TWICE DAILY 60 mL 3  . sertraline (ZOLOFT) 100 MG tablet Take 100 mg by mouth. Take one tablet each morning for depression    . warfarin (COUMADIN) 2.5 MG tablet TAKE AS DIRECTED BY COUMADIN CLINIC. 65 tablet 1  . Water For Irrigation, Sterile (FREE WATER) SOLN Place 230 mLs into feeding tube 3 (three) times daily.    Cliffton Asters Petrolatum-Mineral Oil (SYSTANE NIGHTTIME) OINT Apply 1 application to eye at bedtime. Both eyes     No current facility-administered medications on file prior to visit.     Allergies  Allergen Reactions  . Penicillins Itching, Rash and Other (See Comments)    Tolerated unasyn, zosyn & cephalosporins in the past.  Has patient had a PCN reaction causing immediate rash, facial/tongue/throat swelling, SOB or lightheadedness with hypotension: Yes Has patient had a PCN reaction causing severe rash involving mucus membranes or skin necrosis: Unk Has patient had a PCN reaction that required hospitalization: Unk Has patient had a PCN reaction occurring within the last 10 years: Unk If all of the above answers are "NO", then may proceed with Cephalosporins  . Other     Patient tolerates amoxicillin and cephalosporins     Assessment/Plan:  1. Hypertension - Blood pressure is currently controlled continue the same management.  2. Acute on chronic diastolic CHF, euvolemic, continue the same dose of Lasix, decreased amount of water used for tube flushing to 60 mL 3 times a day.  3. Afib/ atrial flutter - paroxysmal,  rate controlled. Continue coumadin because of history of stroke as well DVT in April 2018 as well, however INRs subtherapeutic since July  2018, [facility] follows.  4. H/o GOO s/p g-tube, possible hernia, The patient is advised to follow with her primary care physician for abdominal scan.  Tobias Alexander, MD 01/24/2017

## 2017-01-26 DIAGNOSIS — Z961 Presence of intraocular lens: Secondary | ICD-10-CM | POA: Diagnosis not present

## 2017-01-26 DIAGNOSIS — H16223 Keratoconjunctivitis sicca, not specified as Sjogren's, bilateral: Secondary | ICD-10-CM | POA: Diagnosis not present

## 2017-01-26 DIAGNOSIS — H04123 Dry eye syndrome of bilateral lacrimal glands: Secondary | ICD-10-CM | POA: Diagnosis not present

## 2017-01-26 DIAGNOSIS — H401131 Primary open-angle glaucoma, bilateral, mild stage: Secondary | ICD-10-CM | POA: Diagnosis not present

## 2017-01-29 ENCOUNTER — Other Ambulatory Visit (HOSPITAL_COMMUNITY): Payer: Self-pay | Admitting: Interventional Radiology

## 2017-01-29 ENCOUNTER — Ambulatory Visit (HOSPITAL_COMMUNITY)
Admission: RE | Admit: 2017-01-29 | Discharge: 2017-01-29 | Disposition: A | Discharge status: Home / Self Care | Payer: Medicare Other | Source: Ambulatory Visit | Attending: Interventional Radiology | Admitting: Interventional Radiology

## 2017-01-29 ENCOUNTER — Other Ambulatory Visit: Payer: Self-pay | Admitting: *Deleted

## 2017-01-29 ENCOUNTER — Encounter (HOSPITAL_COMMUNITY): Payer: Self-pay | Admitting: *Deleted

## 2017-01-29 DIAGNOSIS — R633 Feeding difficulties, unspecified: Secondary | ICD-10-CM

## 2017-01-29 DIAGNOSIS — Z431 Encounter for attention to gastrostomy: Secondary | ICD-10-CM | POA: Insufficient documentation

## 2017-01-29 HISTORY — PX: IR PATIENT EVAL TECH 0-60 MINS: IMG5564

## 2017-01-29 MED ORDER — CYCLOSPORINE 0.05 % OP EMUL: OPHTHALMIC | 3 refills | Status: DC

## 2017-01-29 MED ORDER — SILVER NITRATE-POT NITRATE 75-25 % EX MISC
CUTANEOUS | Status: AC
  Filled 2017-01-29: qty 1

## 2017-01-29 NOTE — Procedures (Signed)
Patient came in today with complaint that feeding pump would not stay in the feeding tube.  Additionally, she has persistent pain around and below the j tube site.  I evaluated the tube and saw no signs of the tube wearing out or problems with the hub.  I advised the patient and her daughter that I could add a lopez valve that would help keep the feeding pump in place.  The patient was not happy with the additional weight of the valve but understood that was the only way to prevent the pump from disconnecting during the night.  She also asked if I could reapply the silver nitrate to the insertion site as previously done.  Shannon [name] [facility] evaluated the site and I proceeded to apply the silver nitrate as requested.  The patient has an appointment to follow up with her primary care physician tomorrow regarding the  continual pain that is not associated with the insertion site or feeding tube.  She will call us if she has any further concerns

## 2017-01-29 NOTE — Telephone Encounter (Signed)
[facility] 

## 2017-01-30 ENCOUNTER — Encounter: Payer: Self-pay | Admitting: Internal Medicine

## 2017-01-30 ENCOUNTER — Ambulatory Visit (INDEPENDENT_AMBULATORY_CARE_PROVIDER_SITE_OTHER): Payer: Medicare Other | Admitting: Internal Medicine

## 2017-01-30 VITALS — BP 118/70 | HR 71 | Temp 97.9°F | Ht 66.0 in | Wt 154.0 lb

## 2017-01-30 DIAGNOSIS — G8929 Other chronic pain: Secondary | ICD-10-CM | POA: Diagnosis not present

## 2017-01-30 DIAGNOSIS — R1013 Epigastric pain: Secondary | ICD-10-CM

## 2017-01-30 DIAGNOSIS — Z931 Gastrostomy status: Secondary | ICD-10-CM | POA: Diagnosis not present

## 2017-01-30 DIAGNOSIS — K311 Adult hypertrophic pyloric stenosis: Secondary | ICD-10-CM

## 2017-01-30 DIAGNOSIS — B370 Candidal stomatitis: Secondary | ICD-10-CM | POA: Diagnosis not present

## 2017-01-30 DIAGNOSIS — I70262 Atherosclerosis of native arteries of extremities with gangrene, left leg: Secondary | ICD-10-CM | POA: Diagnosis not present

## 2017-01-30 DIAGNOSIS — I82542 Chronic embolism and thrombosis of left tibial vein: Secondary | ICD-10-CM

## 2017-01-30 MED ORDER — OXYCODONE HCL 5 MG PO TABS: 5.0000 mg | ORAL_TABLET | ORAL | 0 refills | Status: DC | PRN

## 2017-01-30 MED ORDER — NYSTATIN 100000 UNIT/ML MT SUSP: OROMUCOSAL | 1 refills | Status: DC

## 2017-01-30 NOTE — Progress Notes (Signed)
Patient ID: Shannon [name], female   DOB: 05/02/1923, 83 y.o.   MRN: [id number]    Location:  [facility] Place of Service: OFFICE  Chief Complaint  Patient presents with  . Medical Management of Chronic Issues    1 month follow-up on TF, arthritis, depression and DM   . Abdominal Pain    Patient with ongoin stomach pain, seen @ radiology clinic yesterday. Patient was told stomach pain is not related to tubing, radiology clinic recommended a CT. Please advise   . Oral Pain    Left gum pain and ongoing thrush   . Medication Management    Not swallowing Nystatin suspension- makes her sick on stomach (FYI)     HPI:  83 yo female seen today for f/u  HTN/afib/aflutter/hyperlipidemia/chronic dHF - BP stable on hydralazine, coreg and imdur. Cholesterol stable on lipitor. Rate controlled on coreg.  She takes coumadin for anticoagulation. INR Followed by cardio Dr Delton See. EF 60-65%  GOO - s/p GJ tube. FT changed by IR. Takes Protonix BID. Constipation stable on lactulose. She gets TF at night (osmolite 1.5 cal 237 ml or 3.3 cans per day with promod 3 oz per day @ 50ml.hr x 16hrs) and free water flushes (230 ml) BID. Drinks liquids po. Takes zofran prn. Albumin improved at 3.5  Hyponatremia/ hx SIADH- stable. she was taking salt tabs but noticed the entire tab in her stool intermittently and no longer takes it. Na 133  DM - previously diet controlled but now on insulin tx (novolog SSI and lantus qhs). A1c 7.7%. Takes gabapentin for neuropathy  Hx CVA - stable. Takes plavix and coumadin daily. No bloody stools.   Depression - unchanged. mood stable on sertraline  Arthritis - uncontrolled joint pain. Tramadol is ineffective and she had increased confusion on norco. Tylenol ineffective.  Hypothyroidism - controlled on levothyroxine 137 mcg daily. TSH 0.67  Hx Pressure sore/right 3rd toe osteomyelitis - no change. She completed 1 month of augmentin. She had left 2nd toe amputation by vascular sx on  09/25/16 due to gangrene. Followed by podiatry.   PAD - s/p left SFA stent 09/21/16. ABI on 09/24/16 showed right 0.86; left 0.96. followed by vascular sx. No changes  COPD/asthma - no exacerbation. stable on dulera, xopenex hfa, duonebs. Followed by pulmonary.   Past Medical History:  Diagnosis Date  . Acute respiratory failure (HCC)   . Anemia    previous blood transfusions  . Arthritis    "all over"  . Asthma   . Bradycardia    requiring previous d/c of BB and reduction of amiodarone  . CAD (coronary artery disease)    nonobstructive per notes  . Chronic diastolic CHF (congestive heart failure) (HCC)   . CKD (chronic kidney disease), stage III (HCC)   . Complication of blood transfusion    "got the wrong blood type at [facility] in ~ 2015; no adverse reaction that we are aware of"/daughter, Shannon [name] (01/27/2016)  . COPD (chronic obstructive pulmonary disease) (HCC)   . Depression    "light case"  . DVT (deep venous thrombosis) (HCC) 01/2016   a. LLE DVT 01/2016 - switched from Eliquis to Coumadin.  . Gastric stenosis    a. s/p stomach tube  . GERD (gastroesophageal reflux disease)   . History of blood transfusion    "several" (01/27/2016)  . History of stomach ulcers   . Hyperlipidemia   . Hypertension   . Hypothyroidism   . Paraesophageal hernia   . Perforated gastric  ulcer (HCC)   . Seasonal allergies   . SIADH (syndrome of inappropriate ADH production) (HCC)    [name] 01/10/2015  . Small bowel obstruction (HCC)    "I don't know how many" (01/11/2015)  . Stroke (HCC)    "light one"  . Type II diabetes mellitus (HCC)    "related to prednisone use  for > 20 yr; once predinose stopped; no more DM RX" (01/27/2016)  . UTI (urinary tract infection) 02/08/2016  . Ventral hernia with bowel obstruction     Past Surgical History:  Procedure Laterality Date  . ABDOMINAL AORTOGRAM N/A 09/21/2016   Procedure: Abdominal Aortogram;  Surgeon: Maeola Harman, MD;   Location: [facility] INVASIVE CV LAB;  Service: Cardiovascular;  Laterality: N/A;  . AMPUTATION Left 09/25/2016   Procedure: AMPUTATION DIGIT- LEFT 2ND AND 3RD TOES;  Surgeon: Larina Earthly, MD;  Location: [facility] OR;  Service: Vascular;  Laterality: Left;  . CATARACT EXTRACTION W/ INTRAOCULAR LENS  IMPLANT, BILATERAL    . CHOLECYSTECTOMY OPEN    . COLECTOMY    . ESOPHAGOGASTRODUODENOSCOPY N/A 01/19/2014   Procedure: ESOPHAGOGASTRODUODENOSCOPY (EGD);  Surgeon: Hilarie Fredrickson, MD;  Location: [facility] ENDOSCOPY;  Service: Endoscopy;  Laterality: N/A;  . ESOPHAGOGASTRODUODENOSCOPY N/A 01/20/2014   Procedure: ESOPHAGOGASTRODUODENOSCOPY (EGD);  Surgeon: Hilarie Fredrickson, MD;  Location: [facility] ENDOSCOPY;  Service: Endoscopy;  Laterality: N/A;  . ESOPHAGOGASTRODUODENOSCOPY N/A 03/19/2014   Procedure: ESOPHAGOGASTRODUODENOSCOPY (EGD);  Surgeon: Rachael Fee, MD;  Location: [facility] ENDOSCOPY;  Service: Endoscopy;  Laterality: N/A;  . ESOPHAGOGASTRODUODENOSCOPY N/A 07/08/2015   Procedure: ESOPHAGOGASTRODUODENOSCOPY (EGD);  Surgeon: Sherrilyn Rist, MD;  Location: [facility] ENDOSCOPY;  Service: Endoscopy;  Laterality: N/A;  . ESOPHAGOGASTRODUODENOSCOPY (EGD) WITH PROPOFOL N/A 09/15/2015   Procedure: ESOPHAGOGASTRODUODENOSCOPY (EGD) WITH PROPOFOL;  Surgeon: Ruffin Frederick, MD;  Location: [facility] ENDOSCOPY;  Service: Gastroenterology;  Laterality: N/A;  . GASTROJEJUNOSTOMY     hx/notes 01/10/2015  . GASTROJEJUNOSTOMY N/A 09/23/2015   Procedure: OPEN GASTROJEJUNOSTOMY TUBE PLACEMENT;  Surgeon: De Blanch Kinsinger, MD;  Location: [facility] ORS;  Service: General;  Laterality: N/A;  . GLAUCOMA SURGERY Bilateral   . HERNIA REPAIR  2015  . IR CM INJ ANY COLONIC TUBE W/FLUORO  01/05/2017  . IR GENERIC HISTORICAL  01/07/2016   IR GJ TUBE CHANGE 01/07/2016 Malachy Moan, MD [facility]INTER[facility]  . IR GENERIC HISTORICAL  01/27/2016   IR MECH REMOV OBSTRUC MAT ANY COLON TUBE W/FLUORO 01/27/2016 Richarda Overlie, MD [facility]  . IR GENERIC HISTORICAL  02/07/2016   IR  PATIENT EVAL TECH 0-60 MINS Darrell K Allred, PA-C [facility]  . IR GENERIC HISTORICAL  02/08/2016   IR GJ TUBE CHANGE 02/08/2016 Berdine Dance, MD [facility]  . IR GENERIC HISTORICAL  01/06/2016   IR GJ TUBE CHANGE 01/06/2016 [facility]-RAD OUT REF  . IR GENERIC HISTORICAL  05/02/2016   IR CM INJ ANY COLONIC TUBE W/FLUORO 05/02/2016 Oley Balm, MD [facility]  . IR GENERIC HISTORICAL  05/15/2016   IR GJ TUBE CHANGE 05/15/2016 Simonne Come, MD [facility]  . IR GENERIC HISTORICAL  06/28/2016   IR GJ TUBE CHANGE 06/28/2016 [facility]  . IR PATIENT EVAL TECH 0-60 MINS  10/19/2016  . IR PATIENT EVAL TECH 0-60 MINS  12/25/2016  . IR PATIENT EVAL TECH 0-60 MINS  01/29/2017  . IR REPLC DUODEN/JEJUNO TUBE PERCUT W/FLUORO  11/14/2016  . LAPAROTOMY N/A 01/20/2015   Procedure: EXPLORATORY LAPAROTOMY;  Surgeon: Abigail Miyamoto, MD;  Location: [facility] OR;  Service: General;  Laterality:  N/A;  . LOWER EXTREMITY ANGIOGRAPHY Left 09/21/2016   Procedure: Lower Extremity Angiography;  Surgeon: Maeola Harman, MD;  Location: [facility] INVASIVE CV LAB;  Service: Cardiovascular;  Laterality: Left;  . LYSIS OF ADHESION N/A 01/20/2015   Procedure: LYSIS OF ADHESIONS < 1 HOUR;  Surgeon: Abigail Miyamoto, MD;  Location: [facility] OR;  Service: General;  Laterality: N/A;  . PERIPHERAL VASCULAR BALLOON ANGIOPLASTY Left 09/21/2016   Procedure: Peripheral Vascular Balloon Angioplasty;  Surgeon: Maeola Harman, MD;  Location: [facility] INVASIVE CV LAB;  Service: Cardiovascular;  Laterality: Left;  drug coated balloon  . TONSILLECTOMY    . TUBAL LIGATION    . VENTRAL HERNIA REPAIR  2015   incarcerated ventral hernia ([facility] 09/2013)/notes 01/10/2015    Patient Care Team: Kirt Boys, DO as PCP - General (Internal Medicine) Felecia Shelling, DPM as Consulting Physician (Podiatry) Lars Masson, MD as Consulting Physician (Cardiology)  Social History   Social History  . Marital status: Widowed    Spouse name: N/A  .  Number of children: 3  . Years of education: N/A   Occupational History  . Not on file.   Social History Main Topics  . Smoking status: Never Smoker  . Smokeless tobacco: Former Neurosurgeon    Types: Snuff     Comment: "used snuff in my younger days"  . Alcohol use No  . Drug use: No  . Sexual activity: No   Other Topics Concern  . Not on file   Social History Narrative   Married in [place], lives in one story house with 2 people and no pets   Occupation: ?   Has a living Will, [place], and doesn't have DNR   Was living with Husband (in frail health) in [place] until 09/2013.  After her [facility] admission they both came to live with daughter in [place]     reports that she has never smoked. She has quit using smokeless tobacco. Her smokeless tobacco use included Snuff. She reports that she does not drink alcohol or use drugs.  Family History  Problem Relation Age of Onset  . Stroke Mother   . Hypertension Mother   . Diabetes Brother   . Glaucoma Son   . Glaucoma Son   . Heart attack Neg Hx    Family Status  Relation Status  . Mother Deceased  . Father Deceased  . MGM Deceased  . MGF Deceased  . PGM Deceased  . PGF Deceased  . Brother Other  . Daughter Alive  . Son Alive  . Son Alive  . Neg Hx (Not Specified)     Allergies  Allergen Reactions  . Penicillins Itching, Rash and Other (See Comments)    Tolerated unasyn, zosyn & cephalosporins in the past.  Has patient had a PCN reaction causing immediate rash, facial/tongue/throat swelling, SOB or lightheadedness with hypotension: Yes Has patient had a PCN reaction causing severe rash involving mucus membranes or skin necrosis: Unk Has patient had a PCN reaction that required hospitalization: Unk Has patient had a PCN reaction occurring within the last 10 years: Unk If all of the above answers are "NO", then may proceed with Cephalosporins  . Other     Patient tolerates amoxicillin and cephalosporins     Medications: Patient's Medications  New Prescriptions   No medications on file  Previous Medications   ACETAMINOPHEN (TYLENOL) 500 MG TABLET    Take 1,000 mg by mouth every 6 (six) hours as needed (pain).  AMINO ACIDS-PROTEIN HYDROLYS PO    Take 30 mLs by mouth 2 (two) times daily.   ATORVASTATIN (LIPITOR) 10 MG TABLET    TAKE ONE TABLET BY MOUTH ONCE DAILY   B COMPLEX VITAMINS TABLET    Take 1 tablet by mouth daily.    B-D UF III MINI PEN NEEDLES 31G X 5 MM MISC    USE AS DIRECTED   CALCIUM-VITAMIN D (OSCAL WITH D) 500-200 MG-UNIT PER TABLET    Take 1 tablet by mouth daily with breakfast.    CARVEDILOL (COREG) 6.25 MG TABLET    Take 2 tabs po in AM and 1 tab po in PM for blood pressure   CYCLOSPORINE (RESTASIS) 0.05 % OPHTHALMIC EMULSION    USE 1 DROP INTO BOTH EYES TWICE DAILY   FEEDING SUPPLEMENT (OSMOLITE 1 CAL) LIQD    Take 711 mLs by mouth at bedtime.    FERROUS SULFATE 325 (65 FE) MG TABLET    TAKE 1 TABLET BY MOUTH EVERY DAY WITH BREAKFAST   FLUTICASONE (FLONASE) 50 MCG/ACT NASAL SPRAY    Place 2 sprays into both nostrils daily.   FUROSEMIDE (LASIX) 40 MG TABLET    Take 1 tablet (40 mg total) by mouth daily.   GABAPENTIN (NEURONTIN) 300 MG CAPSULE    Take 2 capsules (600 mg total) by mouth 3 (three) times daily.   HYDRALAZINE (APRESOLINE) 25 MG TABLET    Take 2 tablets in the mornings, 1 tablet in the afternoons, and 1 tablet in the evenings   INSULIN GLARGINE (LANTUS) 100 UNIT/ML SOLOSTAR PEN    Injection 0.1 ml (10 units) once daily to control blood sugar. Dx E11.9   IPRATROPIUM-ALBUTEROL (DUONEB) 0.5-2.5 (3) MG/3ML SOLN    Take 3 mLs by nebulization every 6 (six) hours as needed (sob). Use 3 times daily x 4 days then every 6 hours as needed.   ISOSORBIDE MONONITRATE (IMDUR) 30 MG 24 HR TABLET    Take 30 mg by mouth daily.   LATANOPROST (XALATAN) 0.005 % OPHTHALMIC SOLUTION    Place 1 drop into both eyes at bedtime.   LEVALBUTEROL (XOPENEX HFA) 45 MCG/ACT INHALER    Inhale 2  puffs into the lungs every 6 (six) hours as needed for wheezing or shortness of breath.   LEVOTHYROXINE (SYNTHROID, LEVOTHROID) 137 MCG TABLET    TAKE 1 TABLET (137 MCG TOTAL) BY MOUTH DAILY BEFORE BREAKFAST.   MALTODEXTRIN-XANTHAN GUM (RESOURCE THICKENUP CLEAR) POWD    Take 120 g by mouth as needed (use to thicken liquids.).   MOMETASONE-FORMOTEROL (DULERA) 200-5 MCG/ACT AERO    Inhale 2 puffs into the lungs 2 (two) times daily.   MULTIPLE VITAMIN (MULTIVITAMIN WITH MINERALS) TABS TABLET    Take 1 tablet by mouth daily.   NOVOLOG FLEXPEN 100 UNIT/ML FLEXPEN    BEFORE EACH MEAL 3 TIMES DAILY,140-199-2UNITS,200-250-6 UNITS,300-349-8 UNITS, 350 OR ABOVE 10 UNITS   NYSTATIN (MYCOSTATIN) 100000 UNIT/ML SUSPENSION    5 ml swish and swallow 4 times daily for 1 week then as needed until resolves   NYSTATIN (MYCOSTATIN/NYSTOP) POWDER    Apply topically 3 (three) times daily.   OXYGEN    Inhale 2 L into the lungs at bedtime.   PANTOPRAZOLE (PROTONIX) 40 MG TABLET    TAKE 1 TABLET BY MOUTH TWICE A DAY   POLYETHYL GLYCOL-PROPYL GLYCOL 0.4-0.3 % SOLN    Place 1 drop into both eyes 4 (four) times daily.    SERTRALINE (ZOLOFT) 100 MG TABLET    Take  100 mg by mouth. Take one tablet each morning for depression   WARFARIN (COUMADIN) 2.5 MG TABLET    TAKE AS DIRECTED BY COUMADIN CLINIC.   WATER FOR IRRIGATION, STERILE (FREE WATER) SOLN    Place 60 mLs into feeding tube 3 (three) times daily.    WHITE PETROLATUM-MINERAL OIL (SYSTANE NIGHTTIME) OINT    Apply 1 application to eye at bedtime. Both eyes  Modified Medications   No medications on file  Discontinued Medications   No medications on file    Review of Systems  HENT: Positive for trouble swallowing.   Eyes: Positive for visual disturbance.  Gastrointestinal: Positive for abdominal distention and abdominal pain. Negative for blood in stool.  Musculoskeletal: Positive for arthralgias.  All other systems reviewed and are negative.   Vitals:   01/30/17  1151  BP: 118/70  Pulse: 71  Temp: 97.9 F (36.6 C)  TempSrc: Oral  SpO2: 97%  Weight: 154 lb (69.9 kg)  Height: 5\' 6"  (1.676 m)   Body mass index is 24.86 kg/m.  Physical Exam  Constitutional: She is oriented to person, place, and time. She appears well-developed.  Frail appearing in NAD  HENT:  Mouth/Throat: Oropharynx is clear and moist.  (+) thrush; no blisters  Eyes: Pupils are equal, round, and reactive to light. No scleral icterus.  Neck: Neck supple. Carotid bruit is not present. No tracheal deviation present. No thyromegaly present.  Cardiovascular: Normal rate, regular rhythm and intact distal pulses.  Exam reveals no gallop and no friction rub.   Murmur (2/6 SEM) heard. Trace LE edema b/l. No calf TTP  Pulmonary/Chest: Effort normal and breath sounds normal. No stridor. No respiratory distress. She has no wheezes. She has no rales. She exhibits no tenderness.  Abdominal: Soft. Normal appearance and bowel sounds are normal. She exhibits distension. She exhibits no mass. There is no hepatomegaly. There is tenderness (epigastric). There is no rigidity, no rebound and no guarding. No hernia.  Peg tube intact with min d/c at insertion site, no redness  Musculoskeletal: She exhibits edema and tenderness.  Lymphadenopathy:    She has no cervical adenopathy.  Neurological: She is alert and oriented to person, place, and time.  Skin: Skin is warm and dry. No rash noted. No erythema. No pallor.  Psychiatric: She has a normal mood and affect. Her behavior is normal. Judgment normal.     Labs reviewed: Anti-coag visit on 01/24/2017  Component Date Value Ref Range Status  . INR 01/24/2017 1.6   Final  Anti-coag visit on 01/05/2017  Component Date Value Ref Range Status  . INR 01/05/2017 1.5   Final  Anti-coag visit on 12/20/2016  Component Date Value Ref Range Status  . INR 12/20/2016 1.6   Final  . INR 01/18/2017 1.4   Final  Anti-coag visit on 12/08/2016  Component  Date Value Ref Range Status  . INR 12/08/2016 1.8   Final  Anti-coag visit on 11/22/2016  Component Date Value Ref Range Status  . INR 11/22/2016 1.9   Final  Office Visit on 11/15/2016  Component Date Value Ref Range Status  . WBC 11/15/2016 5.5  3.8 - 10.8 K/uL Final  . RBC 11/15/2016 3.76* 3.80 - 5.10 MIL/uL Final  . Hemoglobin 11/15/2016 10.9* 11.7 - 15.5 g/dL Final  . HCT 40/98/1191 33.8* 35.0 - 45.0 % Final  . MCV 11/15/2016 89.9  80.0 - 100.0 fL Final  . MCH 11/15/2016 29.0  27.0 - 33.0 pg Final  . MCHC 11/15/2016  32.2  32.0 - 36.0 g/dL Final  . RDW 16/01/9603 18.8* 11.0 - 15.0 % Final  . Platelets 11/15/2016 242  140 - 400 K/uL Final  . MPV 11/15/2016 10.5  7.5 - 12.5 fL Final  . Neutro Abs 11/15/2016 2420  1,500 - 7,800 cells/uL Final  . Lymphs Abs 11/15/2016 1210  850 - 3,900 cells/uL Final  . Monocytes Absolute 11/15/2016 770  200 - 950 cells/uL Final  . Eosinophils Absolute 11/15/2016 1045* 15 - 500 cells/uL Final  . Basophils Absolute 11/15/2016 55  0 - 200 cells/uL Final  . Neutrophils Relative % 11/15/2016 44  % Final  . Lymphocytes Relative 11/15/2016 22  % Final  . Monocytes Relative 11/15/2016 14  % Final  . Eosinophils Relative 11/15/2016 19  % Final  . Basophils Relative 11/15/2016 1  % Final  . Smear Review 11/15/2016 Criteria for review not met   Final  . INR 11/15/2016 1.7   Final  Anti-coag visit on 11/08/2016  Component Date Value Ref Range Status  . INR 11/08/2016 1.8   Final  Admission on 11/02/2016, Discharged on 11/02/2016  Component Date Value Ref Range Status  . WBC 11/02/2016 8.0  4.0 - 10.5 K/uL Final  . RBC 11/02/2016 3.91  3.87 - 5.11 MIL/uL Final  . Hemoglobin 11/02/2016 11.1* 12.0 - 15.0 g/dL Final  . HCT 54/12/8117 32.8* 36.0 - 46.0 % Final  . MCV 11/02/2016 83.9  78.0 - 100.0 fL Final  . MCH 11/02/2016 28.4  26.0 - 34.0 pg Final  . MCHC 11/02/2016 33.8  30.0 - 36.0 g/dL Final  . RDW 14/78/2956 18.0* 11.5 - 15.5 % Final  . Platelets  11/02/2016 231  150 - 400 K/uL Final  . Sodium 11/02/2016 133* 135 - 145 mmol/L Final  . Potassium 11/02/2016 4.4  3.5 - 5.1 mmol/L Final  . Chloride 11/02/2016 98* 101 - 111 mmol/L Final  . CO2 11/02/2016 29  22 - 32 mmol/L Final  . Glucose, Bld 11/02/2016 110* 65 - 99 mg/dL Final  . BUN 21/30/8657 32* 6 - 20 mg/dL Final  . Creatinine, Ser 11/02/2016 1.01* 0.44 - 1.00 mg/dL Final  . Calcium 84/69/6295 9.7  8.9 - 10.3 mg/dL Final  . Total Protein 11/02/2016 7.0  6.5 - 8.1 g/dL Final  . Albumin 28/41/3244 3.5  3.5 - 5.0 g/dL Final  . AST 04/05/7251 24  15 - 41 U/L Final  . ALT 11/02/2016 19  14 - 54 U/L Final  . Alkaline Phosphatase 11/02/2016 58  38 - 126 U/L Final  . Total Bilirubin 11/02/2016 0.5  0.3 - 1.2 mg/dL Final  . GFR calc non Af Amer 11/02/2016 47* >60 mL/min Final  . GFR calc Af Amer 11/02/2016 55* >60 mL/min Final   Comment: (NOTE) The eGFR has been calculated using the CKD EPI equation. This calculation has not been validated in all clinical situations. eGFR's persistently <60 mL/min signify possible Chronic Kidney Disease.   . Anion gap 11/02/2016 6  5 - 15 Final  . Color, Urine 11/02/2016 YELLOW  YELLOW Final  . APPearance 11/02/2016 CLEAR  CLEAR Final  . Specific Gravity, Urine 11/02/2016 1.006  1.005 - 1.030 Final  . pH 11/02/2016 8.0  5.0 - 8.0 Final  . Glucose, UA 11/02/2016 NEGATIVE  NEGATIVE mg/dL Final  . Hgb urine dipstick 11/02/2016 NEGATIVE  NEGATIVE Final  . Bilirubin Urine 11/02/2016 NEGATIVE  NEGATIVE Final  . Ketones, ur 11/02/2016 NEGATIVE  NEGATIVE mg/dL Final  . Protein, ur  11/02/2016 100* NEGATIVE mg/dL Final  . Nitrite 40/98/1191 NEGATIVE  NEGATIVE Final  . Leukocytes, UA 11/02/2016 NEGATIVE  NEGATIVE Final  . RBC / HPF 11/02/2016 6-30  0 - 5 RBC/hpf Final  . WBC, UA 11/02/2016 0-5  0 - 5 WBC/hpf Final  . Bacteria, UA 11/02/2016 NONE SEEN  NONE SEEN Final  . Squamous Epithelial / LPF 11/02/2016 NONE SEEN  NONE SEEN Final  . Troponin I  11/02/2016 <0.03  <0.03 ng/mL Final    Ir Cm Inj Any Colonic Tube W/fluoro  Result Date: 01/05/2017 INDICATION: History of chronic feeding jejunostomy catheter now with abdominal pain. Please perform fluoroscopic guided jejunostomy catheter injection and potential exchange. EXAM: FLUOROSCOPIC GUIDED INJECTION OF JEJUNOSTOMY TUBE COMPARISON:  Fluoroscopic guided jejunostomy catheter exchange - 11/14/2016; soft tissue abdominal ultrasound - 12/19/2016 MEDICATIONS: None. CONTRAST:  20 cc Isovue 300 - administered into the jejunum. FLUOROSCOPY TIME:  24 seconds (4.7 mGy) COMPLICATIONS: None immediate. PROCEDURE: The patient was positioned supine on the fluoroscopy table. The patient's abdominal pain was noted to be located remote from the jejunostomy entrance site. A preprocedural spot fluoroscopic image was obtained. Multiple spot fluoroscopic images were obtained from the injection of a small amount of contrast via the existing jejunostomy catheter. Images reviewed in the procedure was terminated. The catheter was flushed with a small amount of saline. A dressing was placed. The patient tolerated the procedure well immediate postprocedural complication. FINDINGS: Unchanged positioning of jejunostomy catheter with tip overlying expected location of the mid small bowel. Contrast injection demonstrates appropriate positioning and functionality of the jejunostomy catheter. There is brisk passage of contrast from the distal end of the catheter into the more distal small bowel. IMPRESSION: 1. Appropriately positioned and functioning jejunostomy catheter. No exchange performed. 2. Note, on exam, the patient's abdominal pain appears remote from the feeding tube. As recent soft tissue ultrasound was unrevealing, if patient's abdominal pain persists, further evaluation with CT of the abdomen pelvis could be performed as indicated. Electronically Signed   By: Simonne Come M.D.   On: 01/05/2017 17:22   Ir Patient Eval Tech  0-60 Mins  Result Date: 01/29/2017 Daryel November     01/29/2017  3:21 PM Patient came in today with complaint that feeding pump would not stay in the feeding tube.  Additionally, she has persistent pain around and below the j tube site.  I evaluated the tube and saw no signs of the tube wearing out or problems with the hub.  I advised the patient and her daughter that I could add a lopez valve that would help keep the feeding pump in place.  The patient was not happy with the additional weight of the valve but understood that was the only way to prevent the pump from disconnecting during the night.  She also asked if I could reapply the silver nitrate to the insertion site as previously done.  [facility] evaluated the site and I proceeded to apply the silver nitrate as requested.  The patient has an appointment to follow up with her primary care physician tomorrow regarding the  continual pain that is not associated with the insertion site or feeding tube.  She will call us if she has any further concerns    Assessment/Plan   ICD-10-CM   1. Abdominal pain, chronic, epigastric R10.13    G89.29   2. Gastric outlet obstruction K31.1   3. PEG (percutaneous endoscopic gastrostomy) status (HCC) Z93.1   4. Oral thrush B37.0 nystatin (MYCOSTATIN)  100000 UNIT/ML suspension   failing to change as expected  5. Chronic deep vein thrombosis (DVT) of tibial vein of left lower extremity (HCC) I82.542   6. Oral thrush B37.0 nystatin (MYCOSTATIN) 100000 UNIT/ML suspension   START ROXICODONE AS NEEDED FOR PAIN. MAY CONTINUE TYLENOL ALSO  Use nystatin suspension for thrush x 1 week then as needed  Continue other medications as ordered  Follow up in 1 month for chronic pain, DM, HTN, PAF    Joshiah Traynham S. Ancil Linsey  [facility] [address] [place], [place] [address] [phone] Cell (Monday-Friday 8 AM - 5 PM) [phone] After 5 PM and follow  prompts

## 2017-01-30 NOTE — Patient Instructions (Addendum)
START ROXICODONE AS NEEDED FOR PAIN. MAY CONTINUE TYLENOL ALSO  Use nystatin suspension for thrush x 1 week then as needed  Continue other medications as ordered  Follow up in 1 month for chronic pain, DM, HTN, PAF  Oxycodone tablets or capsules What is this medicine? OXYCODONE (ox i KOE done) is a pain reliever. It is used to treat moderate to severe pain. This medicine may be used for other purposes; ask your health care provider or pharmacist if you have questions. COMMON BRAND NAME(S): Dazidox, Endocodone, Oxaydo, OXECTA, OxyIR, Percolone, Roxicodone, ROXYBOND What should I tell my health care provider before I take this medicine? They need to know if you have any of these conditions: -Addison's disease -brain tumor -head injury -heart disease -history of drug or alcohol abuse problem -if you often drink alcohol -kidney disease -liver disease -lung or breathing disease, like asthma -mental illness -pancreatic disease -seizures -thyroid disease -an unusual or allergic reaction to oxycodone, codeine, hydrocodone, morphine, other medicines, foods, dyes, or preservatives -pregnant or trying to get pregnant -breast-feeding How should I use this medicine? Take this medicine by mouth with a glass of water. Follow the directions on the prescription label. You can take it with or without food. If it upsets your stomach, take it with food. Take your medicine at regular intervals. Do not take it more often than directed. Do not stop taking except on your doctor's advice. Some brands of this medicine, like Oxecta, have special instructions. Ask your doctor or pharmacist if these directions are for you: Do not cut, crush or chew this medicine. Swallow only one tablet at a time. Do not wet, soak, or lick the tablet before you take it. A special MedGuide will be given to you by the pharmacist with each prescription and refill. Be sure to read this information carefully each time. Talk to your  pediatrician regarding the use of this medicine in children. Special care may be needed. Overdosage: If you think you have taken too much of this medicine contact a poison control center or emergency room at once. NOTE: This medicine is only for you. Do not share this medicine with others. What if I miss a dose? If you miss a dose, take it as soon as you can. If it is almost time for your next dose, take only that dose. Do not take double or extra doses. What may interact with this medicine? This medicine may interact with the following medications: -alcohol -antihistamines for allergy, cough and cold -antiviral medicines for HIV or AIDS -atropine -certain antibiotics like clarithromycin, erythromycin, linezolid, rifampin -certain medicines for anxiety or sleep -certain medicines for bladder problems like oxybutynin, tolterodine -certain medicines for depression like amitriptyline, fluoxetine, sertraline -certain medicines for fungal infections like ketoconazole, itraconazole, voriconazole -certain medicines for migraine headache like almotriptan, eletriptan, frovatriptan, naratriptan, rizatriptan, sumatriptan, zolmitriptan -certain medicines for nausea or vomiting like dolasetron, ondansetron, palonosetron -certain medicines for Parkinson's disease like benztropine, trihexyphenidyl -certain medicines for seizures like phenobarbital, phenytoin, primidone -certain medicines for stomach problems like dicyclomine, hyoscyamine -certain medicines for travel sickness like scopolamine -diuretics -general anesthetics like halothane, isoflurane, methoxyflurane, propofol -ipratropium -local anesthetics like lidocaine, pramoxine, tetracaine -MAOIs like Carbex, Eldepryl, Marplan, Nardil, and Parnate -medicines that relax muscles for surgery -methylene blue -nilotinib -other narcotic medicines for pain or cough -phenothiazines like chlorpromazine, mesoridazine, prochlorperazine, thioridazine This  list may not describe all possible interactions. Give your health care provider a list of all the medicines, herbs, non-prescription drugs, or dietary supplements  you use. Also tell them if you smoke, drink alcohol, or use illegal drugs. Some items may interact with your medicine. What should I watch for while using this medicine? Tell your doctor or health care professional if your pain does not go away, if it gets worse, or if you have new or a different type of pain. You may develop tolerance to the medicine. Tolerance means that you will need a higher dose of the medicine for pain relief. Tolerance is normal and is expected if you take this medicine for a long time. Do not suddenly stop taking your medicine because you may develop a severe reaction. Your body becomes used to the medicine. This does NOT mean you are addicted. Addiction is a behavior related to getting and using a drug for a non-medical reason. If you have pain, you have a medical reason to take pain medicine. Your doctor will tell you how much medicine to take. If your doctor wants you to stop the medicine, the dose will be slowly lowered over time to avoid any side effects. There are different types of narcotic medicines (opiates). If you take more than one type at the same time or if you are taking another medicine that also causes drowsiness, you may have more side effects. Give your health care provider a list of all medicines you use. Your doctor will tell you how much medicine to take. Do not take more medicine than directed. Call emergency for help if you have problems breathing or unusual sleepiness. You may get drowsy or dizzy. Do not drive, use machinery, or do anything that needs mental alertness until you know how the medicine affects you. Do not stand or sit up quickly, especially if you are an older patient. This reduces the risk of dizzy or fainting spells. Alcohol may interfere with the effect of this medicine. Avoid  alcoholic drinks. This medicine will cause constipation. Try to have a bowel movement at least every 2 to 3 days. If you do not have a bowel movement for 3 days, call your doctor or health care professional. Your mouth may get dry. Chewing sugarless gum or sucking hard candy, and drinking plenty of water may help. Contact your doctor if the problem does not go away or is severe. What side effects may I notice from receiving this medicine? Side effects that you should report to your doctor or health care professional as soon as possible: -allergic reactions like skin rash, itching or hives, swelling of the face, lips, or tongue -breathing problems -confusion -signs and symptoms of low blood pressure like dizziness; feeling faint or lightheaded, falls; unusually weak or tired -trouble passing urine or change in the amount of urine -trouble swallowing Side effects that usually do not require medical attention (report to your doctor or health care professional if they continue or are bothersome): -constipation -dry mouth -nausea, vomiting -tiredness This list may not describe all possible side effects. Call your doctor for medical advice about side effects. You may report side effects to FDA at 1-800-FDA-1088. Where should I keep my medicine? Keep out of the reach of children. This medicine can be abused. Keep your medicine in a safe place to protect it from theft. Do not share this medicine with anyone. Selling or giving away this medicine is dangerous and against the law. Store at room temperature between 15 and 30 degrees C (59 and 86 degrees F). Protect from light. Keep container tightly closed. This medicine may cause accidental overdose and  death if it is taken by other adults, children, or pets. Flush any unused medicine down the toilet to reduce the chance of harm. Do not use the medicine after the expiration date. NOTE: This sheet is a summary. It may not cover all possible information. If  you have questions about this medicine, talk to your doctor, pharmacist, or health care provider.  2018 Elsevier/Gold Standard (2015-02-02 16:55:57)

## 2017-01-31 ENCOUNTER — Other Ambulatory Visit: Payer: Self-pay | Admitting: Internal Medicine

## 2017-02-06 ENCOUNTER — Other Ambulatory Visit: Payer: Self-pay | Admitting: Internal Medicine

## 2017-02-06 ENCOUNTER — Ambulatory Visit (INDEPENDENT_AMBULATORY_CARE_PROVIDER_SITE_OTHER): Payer: Medicare Other | Admitting: *Deleted

## 2017-02-06 DIAGNOSIS — Z5181 Encounter for therapeutic drug level monitoring: Secondary | ICD-10-CM | POA: Diagnosis not present

## 2017-02-06 DIAGNOSIS — I4892 Unspecified atrial flutter: Secondary | ICD-10-CM | POA: Diagnosis not present

## 2017-02-06 LAB — POCT INR: INR: 5.5

## 2017-02-08 ENCOUNTER — Encounter: Payer: Self-pay | Admitting: Pulmonary Disease

## 2017-02-08 ENCOUNTER — Ambulatory Visit (INDEPENDENT_AMBULATORY_CARE_PROVIDER_SITE_OTHER): Payer: Medicare Other | Admitting: Pulmonary Disease

## 2017-02-08 VITALS — BP 136/76 | HR 74 | Ht 65.0 in | Wt 155.0 lb

## 2017-02-08 DIAGNOSIS — J4530 Mild persistent asthma, uncomplicated: Secondary | ICD-10-CM

## 2017-02-08 DIAGNOSIS — I70262 Atherosclerosis of native arteries of extremities with gangrene, left leg: Secondary | ICD-10-CM | POA: Diagnosis not present

## 2017-02-08 NOTE — Patient Instructions (Signed)
Continue the [facility] and albuterol rescue inhaler Follow-up in 6 months.

## 2017-02-08 NOTE — Progress Notes (Signed)
Shannon [name]    [id number]    10/10/1923  Primary Care Physician:Carter, Maxine Glenn, DO  Referring Physician: Kirt Boys, DO [address] [place], [place] [address]  Chief complaint:   Follow up  Mild persistent asthma, eosinophilic inflammation  HPI: Shannon [name] is a 83 year old with complicated medical history including heart failure, chronic kidney disease, diabetes, dysphagia with recurrent aspiration status post PEG tube placement, gastric outlet obstruction. She has been hospitalized multiple times over the past yearwith bradycardia, weakness, rapid atrial flutter, left lower extremity DVT,sepsisdue to UTI. Her last admission was in December 2017 for acute on chronic heart failure. At that time palliative care was consulted. She was discharged home with a referral for home palliative care. She is currently DNR/DNI.   She returns to clinic today with chief complaints of dyspnea on exertion with occasional wheeze. She has occasional cough with mucus production. He denies any fevers, chills. She feels that she is slowly improving. She is currently maintained on DuoNeb which she takes twice daily. She is also on Xopenex neb PRN. She has been prescribed Advair but is not taking it on a regular basis. She is a never smoker with no known exposures.  She was hospitalized earlier this month for symptomatic bradycardia, hypotension secondary to beta blocker and calcium channel blocker. She had to be treated with dopamine and glucagon drip. She was evaluated by cardiology but declined a pacemaker. Her stay was complicated by hospital-acquired pneumonia. Also noted to have 1 out of 2 bottles growing Candida. This was thought to be a contaminant but she was started on Diflucan. Repeat cultures are negative.  Interim history:  She continues to do well with no issues.  States that breathing is at baseline.  Maintained on Dulera.  Outpatient Encounter Medications as of 02/08/2017  Medication  Sig  . acetaminophen (TYLENOL) 500 MG tablet Take 1,000 mg by mouth every 6 (six) hours as needed (pain).  . AMINO ACIDS-PROTEIN HYDROLYS PO Take 30 mLs by mouth 2 (two) times daily.  [name] atorvastatin (LIPITOR) 10 MG tablet TAKE ONE TABLET BY MOUTH ONCE DAILY  . b complex vitamins tablet Take 1 tablet by mouth daily.   . B-D UF III MINI PEN NEEDLES 31G X 5 MM MISC USE AS DIRECTED  . calcium-vitamin D (OSCAL WITH D) 500-200 MG-UNIT per tablet Take 1 tablet by mouth daily with breakfast.   . carvedilol (COREG) 6.25 MG tablet Take 2 tabs po in AM and 1 tab po in PM for blood pressure  . cycloSPORINE (RESTASIS) 0.05 % ophthalmic emulsion USE 1 DROP INTO BOTH EYES TWICE DAILY  . feeding supplement (OSMOLITE 1 CAL) LIQD Take 711 mLs by mouth at bedtime.   . ferrous sulfate 325 (65 FE) MG tablet TAKE 1 TABLET BY MOUTH EVERY DAY WITH BREAKFAST  . fluticasone (FLONASE) 50 MCG/ACT nasal spray Place 2 sprays into both nostrils daily.  . furosemide (LASIX) 40 MG tablet Take 1 tablet (40 mg total) by mouth daily.  [name] gabapentin (NEURONTIN) 300 MG capsule TAKE 2 CAPSULES (600 MG TOTAL) BY MOUTH 3 (THREE) TIMES DAILY.  . hydrALAZINE (APRESOLINE) 25 MG tablet Take 2 tablets in the mornings, 1 tablet in the afternoons, and 1 tablet in the evenings  . Insulin Glargine (LANTUS) 100 UNIT/ML Solostar Pen Injection 0.1 ml (10 units) once daily to control blood sugar. Dx E11.9  . ipratropium-albuterol (DUONEB) 0.5-2.5 (3) MG/3ML SOLN Take 3 mLs by nebulization every 6 (six)  hours as needed (sob). Use 3 times daily x 4 days then every 6 hours as needed.  . isosorbide mononitrate (IMDUR) 30 MG 24 hr tablet Take 30 mg by mouth daily.  [name] latanoprost (XALATAN) 0.005 % ophthalmic solution Place 1 drop into both eyes at bedtime.  . levalbuterol (XOPENEX HFA) 45 MCG/ACT inhaler Inhale 2 puffs into the lungs every 6 (six) hours as needed for wheezing or shortness of breath.  . levothyroxine (SYNTHROID, LEVOTHROID) 137 MCG tablet  TAKE 1 TABLET (137 MCG TOTAL) BY MOUTH DAILY BEFORE BREAKFAST.  . Maltodextrin-Xanthan Gum (RESOURCE THICKENUP CLEAR) POWD Take 120 g by mouth as needed (use to thicken liquids.).  [name] mometasone-formoterol (DULERA) 200-5 MCG/ACT AERO Inhale 2 puffs into the lungs 2 (two) times daily.  . Multiple Vitamin (MULTIVITAMIN WITH MINERALS) TABS tablet Take 1 tablet by mouth daily.  [name] NOVOLOG FLEXPEN 100 UNIT/ML FlexPen BEFORE EACH MEAL 3 TIMES DAILY,140-199-2UNITS,200-250-6 UNITS,300-349-8 UNITS, 350 OR ABOVE 10 UNITS  . nystatin (MYCOSTATIN) 100000 UNIT/ML suspension 5 ml swish and spit 4 times daily for 1 week then as needed until resolves  . nystatin (MYCOSTATIN/NYSTOP) powder Apply topically 3 (three) times daily.  [name] oxyCODONE (ROXICODONE) 5 MG immediate release tablet Take 1 tablet (5 mg total) by mouth every 4 (four) hours as needed for severe pain. May take 1/2 tab q4hrs as needed for mild pain.  . OXYGEN Inhale 2 L into the lungs at bedtime.  . pantoprazole (PROTONIX) 40 MG tablet TAKE 1 TABLET BY MOUTH TWICE A DAY  . Polyethyl Glycol-Propyl Glycol 0.4-0.3 % SOLN Place 1 drop into both eyes 4 (four) times daily.   . sertraline (ZOLOFT) 100 MG tablet Take 100 mg by mouth. Take one tablet each morning for depression  . sertraline (ZOLOFT) 100 MG tablet TAKE ONE TABLET BY MOUTH ONCE DAILY FOR DEPRESSION  . warfarin (COUMADIN) 2.5 MG tablet TAKE AS DIRECTED BY COUMADIN CLINIC.  [name] Water For Irrigation, Sterile (FREE WATER) SOLN Place 60 mLs into feeding tube 3 (three) times daily.   Cliffton Asters Petrolatum-Mineral Oil (SYSTANE NIGHTTIME) OINT Apply 1 application to eye at bedtime. Both eyes   No facility-administered encounter medications on file as of 02/08/2017.     Allergies as of 02/08/2017 - Review Complete 02/08/2017  Allergen Reaction Noted  . Penicillins Itching, Rash, and Other (See Comments) 02/12/2016  . Other  08/14/2016    Past Medical History:  Diagnosis Date  . Acute respiratory  failure (HCC)   . Anemia    previous blood transfusions  . Arthritis    "all over"  . Asthma   . Bradycardia    requiring previous d/c of BB and reduction of amiodarone  . CAD (coronary artery disease)    nonobstructive per notes  . Chronic diastolic CHF (congestive heart failure) (HCC)   . CKD (chronic kidney disease), stage III (HCC)   . Complication of blood transfusion    "got the wrong blood type at [facility] in ~ 2015; no adverse reaction that we are aware of"/daughter, [name] (01/27/2016)  . COPD (chronic obstructive pulmonary disease) (HCC)   . Depression    "light case"  . DVT (deep venous thrombosis) (HCC) 01/2016   a. LLE DVT 01/2016 - switched from Eliquis to Coumadin.  . Gastric stenosis    a. s/p stomach tube  . GERD (gastroesophageal reflux disease)   . History of blood transfusion    "several" (01/27/2016)  . History of stomach ulcers   . Hyperlipidemia   .  Hypertension   . Hypothyroidism   . Paraesophageal hernia   . Perforated gastric ulcer (HCC)   . Seasonal allergies   . SIADH (syndrome of inappropriate ADH production) (HCC)    [name] 01/10/2015  . Small bowel obstruction (HCC)    "I don't know how many" (01/11/2015)  . Stroke (HCC)    "light one"  . Type II diabetes mellitus (HCC)    "related to prednisone use  for > 20 yr; once predinose stopped; no more DM RX" (01/27/2016)  . UTI (urinary tract infection) 02/08/2016  . Ventral hernia with bowel obstruction     Past Surgical History:  Procedure Laterality Date  . CATARACT EXTRACTION W/ INTRAOCULAR LENS  IMPLANT, BILATERAL    . CHOLECYSTECTOMY OPEN    . COLECTOMY    . GASTROJEJUNOSTOMY     hx/notes 01/10/2015  . GLAUCOMA SURGERY Bilateral   . HERNIA REPAIR  2015  . IR CM INJ ANY COLONIC TUBE W/FLUORO  01/05/2017  . IR GENERIC HISTORICAL  01/07/2016   IR GJ TUBE CHANGE 01/07/2016 Malachy Moan, MD [facility]  . IR GENERIC HISTORICAL  01/27/2016   IR MECH REMOV OBSTRUC MAT ANY COLON TUBE  W/FLUORO 01/27/2016 Richarda Overlie, MD [facility]  . IR GENERIC HISTORICAL  02/07/2016   IR PATIENT EVAL TECH 0-60 MINS Darrell K Allred, PA-C [facility]  . IR GENERIC HISTORICAL  02/08/2016   IR GJ TUBE CHANGE 02/08/2016 Berdine Dance, MD [facility]  . IR GENERIC HISTORICAL  01/06/2016   IR GJ TUBE CHANGE 01/06/2016 [facility] OUT REF  . IR GENERIC HISTORICAL  05/02/2016   IR CM INJ ANY COLONIC TUBE W/FLUORO 05/02/2016 Oley Balm, MD [facility]  . IR GENERIC HISTORICAL  05/15/2016   IR GJ TUBE CHANGE 05/15/2016 Simonne Come, MD [facility]  . IR GENERIC HISTORICAL  06/28/2016   IR GJ TUBE CHANGE 06/28/2016 [facility]  . IR PATIENT EVAL TECH 0-60 MINS  10/19/2016  . IR PATIENT EVAL TECH 0-60 MINS  12/25/2016  . IR PATIENT EVAL TECH 0-60 MINS  01/29/2017  . IR REPLC DUODEN/JEJUNO TUBE PERCUT W/FLUORO  11/14/2016  . TONSILLECTOMY    . TUBAL LIGATION    . VENTRAL HERNIA REPAIR  2015   incarcerated ventral hernia ([facility] 09/2013)/notes 01/10/2015    Family History  Problem Relation Age of Onset  . Stroke Mother   . Hypertension Mother   . Diabetes Brother   . Glaucoma Son   . Glaucoma Son   . Heart attack Neg Hx     Social History   Socioeconomic History  . Marital status: Widowed    Spouse name: Not on file  . Number of children: 3  . Years of education: Not on file  . Highest education level: Not on file  Social Needs  . Financial resource strain: Not on file  . Food insecurity - worry: Not on file  . Food insecurity - inability: Not on file  . Transportation needs - medical: Not on file  . Transportation needs - non-medical: Not on file  Occupational History  . Not on file  Tobacco Use  . Smoking status: Never Smoker  . Smokeless tobacco: Former Neurosurgeon    Types: Snuff  . Tobacco comment: "used snuff in my younger days"  Substance and Sexual Activity  . Alcohol use: No    Alcohol/week: 0.0 oz  . Drug use: No  . Sexual activity: No  Other Topics Concern  . Not on file  Social History Narrative   Married in [place], lives in one story house with 2 people and no pets   Occupation: ?   Has a living Will, [place], and doesn't have DNR   Was living with Husband (in frail health) in [place] until 09/2013.  After her [facility] admission they both came to live with daughter in [place]   Review of systems: Review of Systems  Constitutional: Negative for fever and chills.  HENT: Negative.   Eyes: Negative for blurred vision.  Respiratory: as per HPI  Cardiovascular: Negative for chest pain and palpitations.  Gastrointestinal: Negative for vomiting, diarrhea, blood per rectum. Genitourinary: Negative for dysuria, urgency, frequency and hematuria.  Musculoskeletal: Negative for myalgias, back pain and joint pain.  Skin: Negative for itching and rash.  Neurological: Negative for dizziness, tremors, focal weakness, seizures and loss of consciousness.  Endo/Heme/Allergies: Negative for environmental allergies.  Psychiatric/Behavioral: Negative for depression, suicidal ideas and hallucinations.  All other systems reviewed and are negative.  Physical Exam: Blood pressure 136/76, pulse 74, height 5\' 5"  (1.651 m), weight 155 lb (70.3 kg), SpO2 98 %. Gen:      No acute distress HEENT:  EOMI, sclera anicteric Neck:     No masses; no thyromegaly Lungs:    Clear to auscultation bilaterally; normal respiratory effort CV:         Regular rate and rhythm; no murmurs Abd:      + bowel sounds; soft, non-tender; no palpable masses, no distension Ext:    No edema; adequate peripheral perfusion Skin:      Warm and dry; no rash Neuro: alert and oriented x 3 Psych: normal mood and affect  Data Reviewed: CXR 03/10/16- left lower lobe infiltrate with associated effusion.  CXR 06/18/16- mild bronchitis. All images personally reviewed.  Echo 01/28/16 Normal LV systolic function; grade 1 diastolic dysfunction; mild AI; mild MR; moderate LAE.  CBC with differential 11/15/16-WBC 5.5,  eosinophils 19%, absolute eosinophil count 1000  Assessment:  Follow up of dyspnea Multifactorial from heart failure, chronic kidney disease, volume overload, issues with bradycardia. She has a diagnosis of COPD and asthma but she is a never smoker and does not have symptoms cough, mucus, wheezing. She was unable to complete an FENO or PFTs  CBC noted with peripheral eosinophilia. She is stable and will continue on dulera. Continue on nebulizers and xopenex PRN  Return in 6 months  Plan/Recommendations: - Continue dulera, xopenex, duonebs PRN  Shannon Greathouse MD [facility] Pager [phone] 02/08/2017, 12:08 PM  CC: Kirt Boys, DO

## 2017-02-13 ENCOUNTER — Other Ambulatory Visit: Payer: Self-pay | Admitting: *Deleted

## 2017-02-13 DIAGNOSIS — K922 Gastrointestinal hemorrhage, unspecified: Secondary | ICD-10-CM

## 2017-02-13 MED ORDER — INSULIN PEN NEEDLE 31G X 5 MM MISC: 3 refills | Status: DC

## 2017-02-13 MED ORDER — PANTOPRAZOLE SODIUM 40 MG PO TBEC: 40.0000 mg | DELAYED_RELEASE_TABLET | Freq: Two times a day (BID) | ORAL | 3 refills | Status: DC

## 2017-02-13 NOTE — Telephone Encounter (Signed)
Daughter, [name] requested to be sent to pharmacy.

## 2017-02-14 ENCOUNTER — Telehealth: Payer: Self-pay | Admitting: *Deleted

## 2017-02-14 MED ORDER — INSULIN PEN NEEDLE 31G X 5 MM MISC: 3 refills | Status: DC

## 2017-02-14 NOTE — Telephone Encounter (Signed)
Patient daughter called and stated that patient needed a prior authorization on her Protonix. Initiated through [employer] [facility] into determination. Patient daughter [name] notified. Also wanted refill on Insulin needles. Refaxed.

## 2017-02-15 ENCOUNTER — Telehealth: Payer: Self-pay | Admitting: *Deleted

## 2017-02-15 ENCOUNTER — Ambulatory Visit (INDEPENDENT_AMBULATORY_CARE_PROVIDER_SITE_OTHER): Payer: Medicare Other | Admitting: *Deleted

## 2017-02-15 DIAGNOSIS — I4892 Unspecified atrial flutter: Secondary | ICD-10-CM | POA: Diagnosis not present

## 2017-02-15 DIAGNOSIS — Z5181 Encounter for therapeutic drug level monitoring: Secondary | ICD-10-CM | POA: Diagnosis not present

## 2017-02-15 LAB — POCT INR: INR: 3.4

## 2017-02-15 NOTE — Telephone Encounter (Signed)
Patient caregiver, [name] called and stated that patient was placed on Roxicodone and ever since patient's BP has been running low. 126/64, 110/54, 104/50, 108/53, 110/52, 109/47, 100/47. Pulse runs around 70-80. [name] stated that she read on Information sheet this could be a side effect from the pain medication. She is wondering if this should be reduced. Also stated that there is a interaction with the Sertraline. Please Advise.

## 2017-02-15 NOTE — Telephone Encounter (Signed)
LMOM to return call.

## 2017-02-15 NOTE — Telephone Encounter (Signed)
Shannon [name] stated that patient's pain is not better. Stated that patient stays in the bed. Patient will only take 1/2 tablets due to fear of getting constipated. States she is sluggish all the time. Please Advise.

## 2017-02-15 NOTE — Telephone Encounter (Signed)
Noted - Oxy can reduce BP and HR. Has she had increased sedation? Is her pain improved on med? If so, recommend she continues it

## 2017-02-15 NOTE — Patient Instructions (Signed)
Do not take any Coumadin today the start taking 2 tablets daily except 3 tablets on Monday, Wednesday, and Friday.  Recheck INR in 12 days. Call us with any medication changes or concerns [phone]. Main # [phone].

## 2017-02-15 NOTE — Telephone Encounter (Signed)
Ok to d/c med 2/2 pt concerns - recommend pain mx eval

## 2017-02-16 NOTE — Telephone Encounter (Signed)
Shannon [name] notified and agreed and confirmed appointment.

## 2017-02-19 ENCOUNTER — Inpatient Hospital Stay (HOSPITAL_COMMUNITY)
Admission: EM | Admit: 2017-02-19 | Discharge: 2017-02-23 | DRG: 920 | Disposition: A | Discharge status: Home / Self Care | Payer: Medicare Other | Attending: Internal Medicine | Admitting: Internal Medicine

## 2017-02-19 ENCOUNTER — Encounter (HOSPITAL_COMMUNITY): Payer: Self-pay

## 2017-02-19 DIAGNOSIS — E222 Syndrome of inappropriate secretion of antidiuretic hormone: Secondary | ICD-10-CM | POA: Diagnosis not present

## 2017-02-19 DIAGNOSIS — D638 Anemia in other chronic diseases classified elsewhere: Secondary | ICD-10-CM | POA: Diagnosis present

## 2017-02-19 DIAGNOSIS — I2510 Atherosclerotic heart disease of native coronary artery without angina pectoris: Secondary | ICD-10-CM | POA: Diagnosis present

## 2017-02-19 DIAGNOSIS — E119 Type 2 diabetes mellitus without complications: Secondary | ICD-10-CM

## 2017-02-19 DIAGNOSIS — Z8249 Family history of ischemic heart disease and other diseases of the circulatory system: Secondary | ICD-10-CM

## 2017-02-19 DIAGNOSIS — Z9049 Acquired absence of other specified parts of digestive tract: Secondary | ICD-10-CM

## 2017-02-19 DIAGNOSIS — T85848A Pain due to other internal prosthetic devices, implants and grafts, initial encounter: Secondary | ICD-10-CM | POA: Diagnosis not present

## 2017-02-19 DIAGNOSIS — T859XXA Unspecified complication of internal prosthetic device, implant and graft, initial encounter: Secondary | ICD-10-CM | POA: Diagnosis not present

## 2017-02-19 DIAGNOSIS — M329 Systemic lupus erythematosus, unspecified: Secondary | ICD-10-CM | POA: Diagnosis present

## 2017-02-19 DIAGNOSIS — Z66 Do not resuscitate: Secondary | ICD-10-CM | POA: Diagnosis present

## 2017-02-19 DIAGNOSIS — I481 Persistent atrial fibrillation: Secondary | ICD-10-CM | POA: Diagnosis present

## 2017-02-19 DIAGNOSIS — J449 Chronic obstructive pulmonary disease, unspecified: Secondary | ICD-10-CM | POA: Diagnosis present

## 2017-02-19 DIAGNOSIS — Z880 Allergy status to penicillin: Secondary | ICD-10-CM

## 2017-02-19 DIAGNOSIS — F329 Major depressive disorder, single episode, unspecified: Secondary | ICD-10-CM | POA: Diagnosis present

## 2017-02-19 DIAGNOSIS — Z87891 Personal history of nicotine dependence: Secondary | ICD-10-CM

## 2017-02-19 DIAGNOSIS — I739 Peripheral vascular disease, unspecified: Secondary | ICD-10-CM | POA: Diagnosis present

## 2017-02-19 DIAGNOSIS — T8140XA Infection following a procedure, unspecified, initial encounter: Secondary | ICD-10-CM | POA: Diagnosis not present

## 2017-02-19 DIAGNOSIS — M1990 Unspecified osteoarthritis, unspecified site: Secondary | ICD-10-CM | POA: Diagnosis present

## 2017-02-19 DIAGNOSIS — J4489 Other specified chronic obstructive pulmonary disease: Secondary | ICD-10-CM | POA: Diagnosis present

## 2017-02-19 DIAGNOSIS — Z823 Family history of stroke: Secondary | ICD-10-CM

## 2017-02-19 DIAGNOSIS — Z961 Presence of intraocular lens: Secondary | ICD-10-CM | POA: Diagnosis present

## 2017-02-19 DIAGNOSIS — K9423 Gastrostomy malfunction: Secondary | ICD-10-CM | POA: Diagnosis not present

## 2017-02-19 DIAGNOSIS — E1122 Type 2 diabetes mellitus with diabetic chronic kidney disease: Secondary | ICD-10-CM

## 2017-02-19 DIAGNOSIS — E032 Hypothyroidism due to medicaments and other exogenous substances: Secondary | ICD-10-CM | POA: Diagnosis present

## 2017-02-19 DIAGNOSIS — N179 Acute kidney failure, unspecified: Secondary | ICD-10-CM | POA: Diagnosis present

## 2017-02-19 DIAGNOSIS — Z9842 Cataract extraction status, left eye: Secondary | ICD-10-CM

## 2017-02-19 DIAGNOSIS — N183 Chronic kidney disease, stage 3 unspecified: Secondary | ICD-10-CM | POA: Diagnosis present

## 2017-02-19 DIAGNOSIS — Z79890 Hormone replacement therapy (postmenopausal): Secondary | ICD-10-CM

## 2017-02-19 DIAGNOSIS — Z7901 Long term (current) use of anticoagulants: Secondary | ICD-10-CM

## 2017-02-19 DIAGNOSIS — L89152 Pressure ulcer of sacral region, stage 2: Secondary | ICD-10-CM | POA: Diagnosis present

## 2017-02-19 DIAGNOSIS — T849XXA Unspecified complication of internal orthopedic prosthetic device, implant and graft, initial encounter: Secondary | ICD-10-CM | POA: Diagnosis not present

## 2017-02-19 DIAGNOSIS — Z86718 Personal history of other venous thrombosis and embolism: Secondary | ICD-10-CM

## 2017-02-19 DIAGNOSIS — Z833 Family history of diabetes mellitus: Secondary | ICD-10-CM

## 2017-02-19 DIAGNOSIS — I130 Hypertensive heart and chronic kidney disease with heart failure and stage 1 through stage 4 chronic kidney disease, or unspecified chronic kidney disease: Secondary | ICD-10-CM | POA: Diagnosis not present

## 2017-02-19 DIAGNOSIS — L03311 Cellulitis of abdominal wall: Secondary | ICD-10-CM | POA: Diagnosis present

## 2017-02-19 DIAGNOSIS — I10 Essential (primary) hypertension: Secondary | ICD-10-CM | POA: Diagnosis present

## 2017-02-19 DIAGNOSIS — Z9841 Cataract extraction status, right eye: Secondary | ICD-10-CM

## 2017-02-19 DIAGNOSIS — T148XXA Other injury of unspecified body region, initial encounter: Secondary | ICD-10-CM

## 2017-02-19 DIAGNOSIS — K439 Ventral hernia without obstruction or gangrene: Secondary | ICD-10-CM | POA: Diagnosis present

## 2017-02-19 DIAGNOSIS — K219 Gastro-esophageal reflux disease without esophagitis: Secondary | ICD-10-CM | POA: Diagnosis present

## 2017-02-19 DIAGNOSIS — T85598A Other mechanical complication of other gastrointestinal prosthetic devices, implants and grafts, initial encounter: Secondary | ICD-10-CM

## 2017-02-19 DIAGNOSIS — E785 Hyperlipidemia, unspecified: Secondary | ICD-10-CM | POA: Diagnosis present

## 2017-02-19 DIAGNOSIS — E861 Hypovolemia: Secondary | ICD-10-CM | POA: Diagnosis present

## 2017-02-19 DIAGNOSIS — L089 Local infection of the skin and subcutaneous tissue, unspecified: Secondary | ICD-10-CM

## 2017-02-19 DIAGNOSIS — I4819 Other persistent atrial fibrillation: Secondary | ICD-10-CM | POA: Diagnosis present

## 2017-02-19 DIAGNOSIS — K9420 Gastrostomy complication, unspecified: Secondary | ICD-10-CM | POA: Diagnosis present

## 2017-02-19 DIAGNOSIS — K5730 Diverticulosis of large intestine without perforation or abscess without bleeding: Secondary | ICD-10-CM | POA: Diagnosis not present

## 2017-02-19 DIAGNOSIS — Z8673 Personal history of transient ischemic attack (TIA), and cerebral infarction without residual deficits: Secondary | ICD-10-CM

## 2017-02-19 DIAGNOSIS — Z794 Long term (current) use of insulin: Secondary | ICD-10-CM

## 2017-02-19 DIAGNOSIS — Z7951 Long term (current) use of inhaled steroids: Secondary | ICD-10-CM

## 2017-02-19 DIAGNOSIS — I5032 Chronic diastolic (congestive) heart failure: Secondary | ICD-10-CM | POA: Diagnosis present

## 2017-02-19 NOTE — ED Notes (Signed)
Pt states she does not have an appt tomorrow her daughter does and she states that it doesn't hurt right now

## 2017-02-19 NOTE — ED Notes (Signed)
Bed: TU84 Expected date:  Expected time:  Means of arrival:  Comments: 83 yo F/Feeding tube came out

## 2017-02-19 NOTE — ED Triage Notes (Signed)
Pt's feeding tube came out tonight, pt is from home Pt says that it's been leaking for a few weeks and it has an odor, pt has an appt tomorrow, tonight it came completely out when getting in bed Pt does complain of pain at the site

## 2017-02-20 ENCOUNTER — Other Ambulatory Visit: Payer: Self-pay

## 2017-02-20 ENCOUNTER — Observation Stay (HOSPITAL_COMMUNITY): Payer: Medicare Other

## 2017-02-20 ENCOUNTER — Encounter (HOSPITAL_COMMUNITY): Payer: Self-pay | Admitting: Family Medicine

## 2017-02-20 ENCOUNTER — Emergency Department (HOSPITAL_COMMUNITY): Payer: Medicare Other

## 2017-02-20 DIAGNOSIS — J449 Chronic obstructive pulmonary disease, unspecified: Secondary | ICD-10-CM

## 2017-02-20 DIAGNOSIS — E119 Type 2 diabetes mellitus without complications: Secondary | ICD-10-CM

## 2017-02-20 DIAGNOSIS — Z9841 Cataract extraction status, right eye: Secondary | ICD-10-CM | POA: Diagnosis not present

## 2017-02-20 DIAGNOSIS — I5032 Chronic diastolic (congestive) heart failure: Secondary | ICD-10-CM

## 2017-02-20 DIAGNOSIS — K9420 Gastrostomy complication, unspecified: Secondary | ICD-10-CM | POA: Diagnosis present

## 2017-02-20 DIAGNOSIS — I10 Essential (primary) hypertension: Secondary | ICD-10-CM

## 2017-02-20 DIAGNOSIS — D638 Anemia in other chronic diseases classified elsewhere: Secondary | ICD-10-CM | POA: Diagnosis not present

## 2017-02-20 DIAGNOSIS — I481 Persistent atrial fibrillation: Secondary | ICD-10-CM | POA: Diagnosis not present

## 2017-02-20 DIAGNOSIS — Z86718 Personal history of other venous thrombosis and embolism: Secondary | ICD-10-CM | POA: Diagnosis not present

## 2017-02-20 DIAGNOSIS — I739 Peripheral vascular disease, unspecified: Secondary | ICD-10-CM | POA: Diagnosis present

## 2017-02-20 DIAGNOSIS — N189 Chronic kidney disease, unspecified: Secondary | ICD-10-CM

## 2017-02-20 DIAGNOSIS — E032 Hypothyroidism due to medicaments and other exogenous substances: Secondary | ICD-10-CM

## 2017-02-20 DIAGNOSIS — E222 Syndrome of inappropriate secretion of antidiuretic hormone: Secondary | ICD-10-CM | POA: Diagnosis present

## 2017-02-20 DIAGNOSIS — Z79890 Hormone replacement therapy (postmenopausal): Secondary | ICD-10-CM | POA: Diagnosis not present

## 2017-02-20 DIAGNOSIS — Z8673 Personal history of transient ischemic attack (TIA), and cerebral infarction without residual deficits: Secondary | ICD-10-CM | POA: Diagnosis not present

## 2017-02-20 DIAGNOSIS — M1990 Unspecified osteoarthritis, unspecified site: Secondary | ICD-10-CM | POA: Diagnosis present

## 2017-02-20 DIAGNOSIS — I130 Hypertensive heart and chronic kidney disease with heart failure and stage 1 through stage 4 chronic kidney disease, or unspecified chronic kidney disease: Secondary | ICD-10-CM | POA: Diagnosis present

## 2017-02-20 DIAGNOSIS — K9423 Gastrostomy malfunction: Secondary | ICD-10-CM | POA: Insufficient documentation

## 2017-02-20 DIAGNOSIS — I2510 Atherosclerotic heart disease of native coronary artery without angina pectoris: Secondary | ICD-10-CM | POA: Diagnosis present

## 2017-02-20 DIAGNOSIS — T85848A Pain due to other internal prosthetic devices, implants and grafts, initial encounter: Secondary | ICD-10-CM | POA: Diagnosis present

## 2017-02-20 DIAGNOSIS — L03311 Cellulitis of abdominal wall: Secondary | ICD-10-CM | POA: Diagnosis present

## 2017-02-20 DIAGNOSIS — Z961 Presence of intraocular lens: Secondary | ICD-10-CM | POA: Diagnosis present

## 2017-02-20 DIAGNOSIS — Z9842 Cataract extraction status, left eye: Secondary | ICD-10-CM | POA: Diagnosis not present

## 2017-02-20 DIAGNOSIS — N179 Acute kidney failure, unspecified: Secondary | ICD-10-CM | POA: Diagnosis not present

## 2017-02-20 DIAGNOSIS — L89152 Pressure ulcer of sacral region, stage 2: Secondary | ICD-10-CM | POA: Diagnosis present

## 2017-02-20 DIAGNOSIS — N183 Chronic kidney disease, stage 3 (moderate): Secondary | ICD-10-CM

## 2017-02-20 DIAGNOSIS — K219 Gastro-esophageal reflux disease without esophagitis: Secondary | ICD-10-CM | POA: Diagnosis present

## 2017-02-20 DIAGNOSIS — M329 Systemic lupus erythematosus, unspecified: Secondary | ICD-10-CM | POA: Diagnosis present

## 2017-02-20 DIAGNOSIS — Z9049 Acquired absence of other specified parts of digestive tract: Secondary | ICD-10-CM | POA: Diagnosis not present

## 2017-02-20 DIAGNOSIS — E785 Hyperlipidemia, unspecified: Secondary | ICD-10-CM | POA: Diagnosis present

## 2017-02-20 DIAGNOSIS — Z7901 Long term (current) use of anticoagulants: Secondary | ICD-10-CM | POA: Diagnosis not present

## 2017-02-20 DIAGNOSIS — K5730 Diverticulosis of large intestine without perforation or abscess without bleeding: Secondary | ICD-10-CM | POA: Diagnosis not present

## 2017-02-20 HISTORY — PX: IR GJ TUBE CHANGE: IMG1440

## 2017-02-20 LAB — CBC WITH DIFFERENTIAL/PLATELET
Basophils Absolute: 0 10*3/uL (ref 0.0–0.1)
Basophils Relative: 0 %
Eosinophils Absolute: 1.2 10*3/uL — ABNORMAL HIGH (ref 0.0–0.7)
Eosinophils Relative: 13 %
HCT: 27.1 % — ABNORMAL LOW (ref 36.0–46.0)
Hemoglobin: 9 g/dL — ABNORMAL LOW (ref 12.0–15.0)
Lymphocytes Relative: 17 %
Lymphs Abs: 1.6 10*3/uL (ref 0.7–4.0)
MCH: 30.8 pg (ref 26.0–34.0)
MCHC: 33.2 g/dL (ref 30.0–36.0)
MCV: 92.8 fL (ref 78.0–100.0)
Monocytes Absolute: 1 10*3/uL (ref 0.1–1.0)
Monocytes Relative: 10 %
Neutro Abs: 5.6 10*3/uL (ref 1.7–7.7)
Neutrophils Relative %: 60 %
Platelets: 228 10*3/uL (ref 150–400)
RBC: 2.92 MIL/uL — ABNORMAL LOW (ref 3.87–5.11)
RDW: 16.2 % — ABNORMAL HIGH (ref 11.5–15.5)
WBC: 9.4 10*3/uL (ref 4.0–10.5)

## 2017-02-20 LAB — GLUCOSE, CAPILLARY
Glucose-Capillary: 114 mg/dL — ABNORMAL HIGH (ref 65–99)
Glucose-Capillary: 125 mg/dL — ABNORMAL HIGH (ref 65–99)
Glucose-Capillary: 132 mg/dL — ABNORMAL HIGH (ref 65–99)
Glucose-Capillary: 166 mg/dL — ABNORMAL HIGH (ref 65–99)
Glucose-Capillary: 169 mg/dL — ABNORMAL HIGH (ref 65–99)
Glucose-Capillary: 54 mg/dL — ABNORMAL LOW (ref 65–99)

## 2017-02-20 LAB — I-STAT CHEM 8, ED
BUN: 78 mg/dL — ABNORMAL HIGH (ref 6–20)
Calcium, Ion: 1.2 mmol/L (ref 1.15–1.40)
Chloride: 105 mmol/L (ref 101–111)
Creatinine, Ser: 1.8 mg/dL — ABNORMAL HIGH (ref 0.44–1.00)
Glucose, Bld: 123 mg/dL — ABNORMAL HIGH (ref 65–99)
HCT: 27 % — ABNORMAL LOW (ref 36.0–46.0)
Hemoglobin: 9.2 g/dL — ABNORMAL LOW (ref 12.0–15.0)
Potassium: 4.1 mmol/L (ref 3.5–5.1)
Sodium: 143 mmol/L (ref 135–145)
TCO2: 28 mmol/L (ref 22–32)

## 2017-02-20 LAB — SODIUM, URINE, RANDOM: Sodium, Ur: 95 mmol/L

## 2017-02-20 LAB — URINALYSIS, ROUTINE W REFLEX MICROSCOPIC
Bilirubin Urine: NEGATIVE
Glucose, UA: NEGATIVE mg/dL
Hgb urine dipstick: NEGATIVE
Ketones, ur: NEGATIVE mg/dL
Nitrite: NEGATIVE
Protein, ur: NEGATIVE mg/dL
Specific Gravity, Urine: 1.014 (ref 1.005–1.030)
pH: 7 (ref 5.0–8.0)

## 2017-02-20 LAB — MRSA PCR SCREENING: MRSA by PCR: NEGATIVE

## 2017-02-20 LAB — PROTIME-INR
INR: 2.88
Prothrombin Time: 30 seconds — ABNORMAL HIGH (ref 11.4–15.2)

## 2017-02-20 LAB — CREATININE, URINE, RANDOM: Creatinine, Urine: 49.33 mg/dL

## 2017-02-20 MED ORDER — ENSURE ENLIVE PO LIQD
711.0000 mL | Freq: Every day | ORAL | Status: DC
  Administered 2017-02-20: 711 mL via ORAL

## 2017-02-20 MED ORDER — ACETAMINOPHEN 160 MG/5ML PO SOLN
325.0000 mg | Freq: Three times a day (TID) | ORAL | Status: DC
  Administered 2017-02-20 – 2017-02-23 (×9): 325 mg via ORAL
  Filled 2017-02-20 (×9): qty 20.3

## 2017-02-20 MED ORDER — ACETAMINOPHEN 325 MG PO TABS
650.0000 mg | ORAL_TABLET | Freq: Four times a day (QID) | ORAL | Status: DC | PRN
Start: 2017-02-20 — End: 2017-02-23

## 2017-02-20 MED ORDER — DEXTROSE 50 % IV SOLN
INTRAVENOUS | Status: AC
  Administered 2017-02-20: 25 mL
  Filled 2017-02-20: qty 50

## 2017-02-20 MED ORDER — SODIUM CHLORIDE 0.9 % IV SOLN
1.0000 g | Freq: Once | INTRAVENOUS | Status: AC
  Administered 2017-02-20: 1 g via INTRAVENOUS
  Filled 2017-02-20: qty 1

## 2017-02-20 MED ORDER — FREE WATER
60.0000 mL | Freq: Three times a day (TID) | Status: DC
  Administered 2017-02-22 – 2017-02-23 (×4): 60 mL

## 2017-02-20 MED ORDER — SODIUM CHLORIDE 0.9 % IV SOLN
INTRAVENOUS | Status: DC
  Administered 2017-02-20: 06:00:00 via INTRAVENOUS

## 2017-02-20 MED ORDER — WARFARIN - PHARMACIST DOSING INPATIENT: Freq: Every day | Status: DC

## 2017-02-20 MED ORDER — CARVEDILOL 6.25 MG PO TABS: 6.2500 mg | ORAL_TABLET | Freq: Every day | ORAL | Status: DC

## 2017-02-20 MED ORDER — IPRATROPIUM-ALBUTEROL 0.5-2.5 (3) MG/3ML IN SOLN: 3.0000 mL | Freq: Four times a day (QID) | RESPIRATORY_TRACT | Status: DC | PRN

## 2017-02-20 MED ORDER — IOPAMIDOL (ISOVUE-300) INJECTION 61%
INTRAVENOUS | Status: AC
  Administered 2017-02-20: 15 mL
  Filled 2017-02-20: qty 50

## 2017-02-20 MED ORDER — CARVEDILOL 12.5 MG PO TABS
12.5000 mg | ORAL_TABLET | Freq: Every day | ORAL | Status: DC
  Administered 2017-02-20 – 2017-02-22 (×3): 12.5 mg via ORAL
  Filled 2017-02-20 (×3): qty 1

## 2017-02-20 MED ORDER — LEVOTHYROXINE SODIUM 25 MCG PO TABS
137.0000 ug | ORAL_TABLET | Freq: Every day | ORAL | Status: DC
  Administered 2017-02-21 – 2017-02-23 (×3): 137 ug via ORAL
  Filled 2017-02-20 (×3): qty 1

## 2017-02-20 MED ORDER — ONDANSETRON HCL 4 MG/2ML IJ SOLN: 4.0000 mg | Freq: Four times a day (QID) | INTRAMUSCULAR | Status: DC | PRN

## 2017-02-20 MED ORDER — ADULT MULTIVITAMIN W/MINERALS CH
1.0000 | ORAL_TABLET | Freq: Every day | ORAL | Status: DC
  Administered 2017-02-21 – 2017-02-22 (×2): 1 via ORAL
  Filled 2017-02-20 (×3): qty 1

## 2017-02-20 MED ORDER — TIOTROPIUM BROMIDE MONOHYDRATE 18 MCG IN CAPS
18.0000 ug | ORAL_CAPSULE | Freq: Every day | RESPIRATORY_TRACT | Status: DC
  Administered 2017-02-21 – 2017-02-23 (×3): 18 ug via RESPIRATORY_TRACT
  Filled 2017-02-20: qty 5

## 2017-02-20 MED ORDER — SODIUM CHLORIDE 0.9 % IV SOLN: INTRAVENOUS | Status: DC

## 2017-02-20 MED ORDER — CALCIUM CARBONATE-VITAMIN D 500-200 MG-UNIT PO TABS
1.0000 | ORAL_TABLET | Freq: Every day | ORAL | Status: DC
  Administered 2017-02-21 – 2017-02-23 (×3): 1 via ORAL
  Filled 2017-02-20 (×5): qty 1

## 2017-02-20 MED ORDER — METOPROLOL TARTRATE 5 MG/5ML IV SOLN: 5.0000 mg | Freq: Three times a day (TID) | INTRAVENOUS | Status: DC

## 2017-02-20 MED ORDER — SODIUM CHLORIDE 0.9 % IV SOLN
1.0000 g | Freq: Two times a day (BID) | INTRAVENOUS | Status: DC
  Administered 2017-02-20 – 2017-02-23 (×6): 1 g via INTRAVENOUS
  Filled 2017-02-20 (×7): qty 1

## 2017-02-20 MED ORDER — SODIUM CHLORIDE 0.9 % IV BOLUS (SEPSIS)
500.0000 mL | Freq: Once | INTRAVENOUS | Status: AC
  Administered 2017-02-20: 500 mL via INTRAVENOUS

## 2017-02-20 MED ORDER — MOMETASONE FURO-FORMOTEROL FUM 200-5 MCG/ACT IN AERO
2.0000 | INHALATION_SPRAY | Freq: Two times a day (BID) | RESPIRATORY_TRACT | Status: DC
  Administered 2017-02-20 – 2017-02-23 (×7): 2 via RESPIRATORY_TRACT
  Filled 2017-02-20: qty 8.8

## 2017-02-20 MED ORDER — INSULIN ASPART 100 UNIT/ML ~~LOC~~ SOLN
0.0000 [IU] | SUBCUTANEOUS | Status: DC
  Administered 2017-02-20 (×2): 2 [IU] via SUBCUTANEOUS
  Administered 2017-02-20: 3 [IU] via SUBCUTANEOUS
  Administered 2017-02-21: 0 [IU] via SUBCUTANEOUS
  Administered 2017-02-21 (×2): 2 [IU] via SUBCUTANEOUS
  Administered 2017-02-21 – 2017-02-22 (×2): 3 [IU] via SUBCUTANEOUS
  Administered 2017-02-22: 2 [IU] via SUBCUTANEOUS
  Administered 2017-02-23: 3 [IU] via SUBCUTANEOUS
  Administered 2017-02-23: 2 [IU] via SUBCUTANEOUS
  Administered 2017-02-23: 3 [IU] via SUBCUTANEOUS
  Administered 2017-02-23: 2 [IU] via SUBCUTANEOUS

## 2017-02-20 MED ORDER — SERTRALINE HCL 100 MG PO TABS
100.0000 mg | ORAL_TABLET | Freq: Every day | ORAL | Status: DC
  Administered 2017-02-20 – 2017-02-23 (×4): 100 mg via ORAL
  Filled 2017-02-20 (×4): qty 1

## 2017-02-20 MED ORDER — LEVOTHYROXINE SODIUM 112 MCG PO TABS
137.0000 ug | ORAL_TABLET | Freq: Every day | ORAL | Status: DC
  Filled 2017-02-20: qty 1

## 2017-02-20 MED ORDER — HYDRALAZINE HCL 25 MG PO TABS
25.0000 mg | ORAL_TABLET | ORAL | Status: DC
  Administered 2017-02-20 – 2017-02-23 (×6): 25 mg via ORAL
  Filled 2017-02-20 (×6): qty 1

## 2017-02-20 MED ORDER — IOPAMIDOL (ISOVUE-300) INJECTION 61%
50.0000 mL | Freq: Once | INTRAVENOUS | Status: AC | PRN
  Administered 2017-02-20: 15 mL

## 2017-02-20 MED ORDER — FLUTICASONE PROPIONATE 50 MCG/ACT NA SUSP
2.0000 | Freq: Every day | NASAL | Status: DC
  Administered 2017-02-20 – 2017-02-23 (×4): 2 via NASAL
  Filled 2017-02-20: qty 16

## 2017-02-20 MED ORDER — FENTANYL CITRATE (PF) 100 MCG/2ML IJ SOLN
50.0000 ug | Freq: Once | INTRAMUSCULAR | Status: AC
  Administered 2017-02-20: 50 ug via INTRAVENOUS
  Filled 2017-02-20: qty 2

## 2017-02-20 MED ORDER — DEXTROSE-NACL 5-0.9 % IV SOLN
INTRAVENOUS | Status: DC
  Administered 2017-02-20: 14:00:00 via INTRAVENOUS
  Filled 2017-02-20 (×2): qty 1000

## 2017-02-20 MED ORDER — PANTOPRAZOLE SODIUM 40 MG PO TBEC
40.0000 mg | DELAYED_RELEASE_TABLET | Freq: Two times a day (BID) | ORAL | Status: DC
  Administered 2017-02-20 – 2017-02-23 (×6): 40 mg via ORAL
  Filled 2017-02-20 (×7): qty 1

## 2017-02-20 MED ORDER — B COMPLEX-C PO TABS
1.0000 | ORAL_TABLET | Freq: Every day | ORAL | Status: DC
  Administered 2017-02-20 – 2017-02-23 (×4): 1 via ORAL
  Filled 2017-02-20 (×4): qty 1

## 2017-02-20 MED ORDER — CYCLOSPORINE 0.05 % OP EMUL
1.0000 [drp] | Freq: Two times a day (BID) | OPHTHALMIC | Status: DC
  Administered 2017-02-20 – 2017-02-23 (×7): 1 [drp] via OPHTHALMIC
  Filled 2017-02-20 (×7): qty 1

## 2017-02-20 MED ORDER — LATANOPROST 0.005 % OP SOLN
1.0000 [drp] | Freq: Every day | OPHTHALMIC | Status: DC
  Administered 2017-02-20 – 2017-02-22 (×3): 1 [drp] via OPHTHALMIC
  Filled 2017-02-20: qty 2.5

## 2017-02-20 MED ORDER — DEXTROSE-NACL 5-0.9 % IV SOLN
INTRAVENOUS | Status: AC
  Administered 2017-02-20 – 2017-02-22 (×2): via INTRAVENOUS

## 2017-02-20 MED ORDER — ACETAMINOPHEN 650 MG RE SUPP: 650.0000 mg | Freq: Four times a day (QID) | RECTAL | Status: DC | PRN

## 2017-02-20 MED ORDER — ISOSORBIDE MONONITRATE ER 30 MG PO TB24
30.0000 mg | ORAL_TABLET | Freq: Every day | ORAL | Status: DC
  Administered 2017-02-20 – 2017-02-23 (×4): 30 mg via ORAL
  Filled 2017-02-20 (×4): qty 1

## 2017-02-20 MED ORDER — WARFARIN SODIUM 5 MG PO TABS
5.0000 mg | ORAL_TABLET | Freq: Once | ORAL | Status: AC
  Administered 2017-02-20: 5 mg via ORAL
  Filled 2017-02-20: qty 1

## 2017-02-20 MED ORDER — LEVOTHYROXINE SODIUM 137 MCG PO TABS: 137.0000 ug | ORAL_TABLET | Freq: Every day | ORAL | Status: DC

## 2017-02-20 MED ORDER — LEVOTHYROXINE SODIUM 100 MCG IV SOLR
68.5000 ug | Freq: Every day | INTRAVENOUS | Status: DC
  Administered 2017-02-20: 68.5 ug via INTRAVENOUS
  Filled 2017-02-20: qty 5

## 2017-02-20 MED ORDER — CARVEDILOL 12.5 MG PO TABS: 12.5000 mg | ORAL_TABLET | Freq: Every day | ORAL | Status: DC

## 2017-02-20 MED ORDER — POLYVINYL ALCOHOL 1.4 % OP SOLN
1.0000 [drp] | Freq: Four times a day (QID) | OPHTHALMIC | Status: DC
  Administered 2017-02-20 – 2017-02-22 (×9): 1 [drp] via OPHTHALMIC
  Filled 2017-02-20: qty 15

## 2017-02-20 MED ORDER — ATORVASTATIN CALCIUM 10 MG PO TABS
10.0000 mg | ORAL_TABLET | Freq: Every day | ORAL | Status: DC
  Administered 2017-02-20 – 2017-02-22 (×3): 10 mg via ORAL
  Filled 2017-02-20 (×3): qty 1

## 2017-02-20 MED ORDER — OXYCODONE HCL 5 MG PO TABS
5.0000 mg | ORAL_TABLET | ORAL | Status: DC | PRN
  Administered 2017-02-20 – 2017-02-21 (×3): 5 mg via ORAL
  Filled 2017-02-20 (×3): qty 1

## 2017-02-20 MED ORDER — DEXTROSE-NACL 5-0.9 % IV SOLN
INTRAVENOUS | Status: DC
Start: 2017-02-20 — End: 2017-02-20

## 2017-02-20 MED ORDER — CARVEDILOL 25 MG PO TABS
25.0000 mg | ORAL_TABLET | Freq: Every day | ORAL | Status: DC
  Administered 2017-02-21 – 2017-02-23 (×3): 25 mg via ORAL
  Filled 2017-02-20 (×3): qty 1

## 2017-02-20 MED ORDER — HYDRALAZINE HCL 50 MG PO TABS
50.0000 mg | ORAL_TABLET | Freq: Every day | ORAL | Status: DC
  Administered 2017-02-21 – 2017-02-23 (×3): 50 mg via ORAL
  Filled 2017-02-20 (×4): qty 1

## 2017-02-20 MED ORDER — GABAPENTIN 300 MG PO CAPS
600.0000 mg | ORAL_CAPSULE | Freq: Three times a day (TID) | ORAL | Status: DC
Start: 2017-02-20 — End: 2017-02-23
  Administered 2017-02-20 – 2017-02-23 (×10): 600 mg via ORAL
  Filled 2017-02-20 (×11): qty 2

## 2017-02-20 MED ORDER — LIDOCAINE VISCOUS 2 % MT SOLN
OROMUCOSAL | Status: AC
  Filled 2017-02-20: qty 15

## 2017-02-20 MED ORDER — ONDANSETRON HCL 4 MG PO TABS
4.0000 mg | ORAL_TABLET | Freq: Four times a day (QID) | ORAL | Status: DC | PRN
  Administered 2017-02-22: 4 mg via ORAL
  Filled 2017-02-20: qty 1

## 2017-02-20 MED ORDER — VANCOMYCIN HCL IN DEXTROSE 1-5 GM/200ML-% IV SOLN
1000.0000 mg | Freq: Once | INTRAVENOUS | Status: AC
  Administered 2017-02-20: 1000 mg via INTRAVENOUS
  Filled 2017-02-20: qty 200

## 2017-02-20 MED ORDER — FERROUS SULFATE 325 (65 FE) MG PO TABS
325.0000 mg | ORAL_TABLET | Freq: Every day | ORAL | Status: DC
  Administered 2017-02-20 – 2017-02-23 (×4): 325 mg via ORAL
  Filled 2017-02-20 (×3): qty 1

## 2017-02-20 MED ORDER — ARTIFICIAL TEARS OPHTHALMIC OINT
1.0000 "application " | TOPICAL_OINTMENT | Freq: Every day | OPHTHALMIC | Status: DC
  Administered 2017-02-20 – 2017-02-22 (×3): 1 via OPHTHALMIC
  Filled 2017-02-20: qty 3.5

## 2017-02-20 MED ORDER — LEVALBUTEROL HCL 0.63 MG/3ML IN NEBU: 0.6300 mg | INHALATION_SOLUTION | Freq: Three times a day (TID) | RESPIRATORY_TRACT | Status: DC

## 2017-02-20 NOTE — H&P (Signed)
History and Physical    Shannon [name] HYQ:[id number] DOB: [birth year] DOA: 02/19/2017  PCP: Kirt Boys, DO   Patient coming from: Home  Chief Complaint: Feeding tube problems   HPI: Shannon [name] is a 83 y.o. female with medical history significant for atrial fibrillation on warfarin, history of CVA, hypothyroidism, hypertension, insulin-dependent diabetes mellitus, chronic diastolic CHF, and PEG tube dependence, now presenting to the emergency department for feeding tube problems.  Patient began to note leaking from about the PEG tube, as well as pain at the site a few weeks ago, and also reports a worsening foul odor over the same interval.  The tube then became inadvertently dislodged last night, prompting her presentation to the ED.  Denies fevers or chills.  Reports thick brownish output from the PEG tube site.  ED Course: Upon arrival to the ED, patient is found to be afebrile, saturating well on room air, and with vitals otherwise stable.  Chemistry panel is notable for a BUN of 78 and creatinine 1.80, up from 1.01 in August of this year.  CBC is notable for a chronic normocytic anemia with hemoglobin of 9.0, and INR is therapeutic at 2.88.  Noncontrast CT of the abdomen and pelvis features low-attenuation lesions throughout the liver that are indeterminant and could be further evaluated with MRI as appropriate.  Also noted on the CT is large ventral hernia containing transverse colon and small bowel without evidence for obstruction, and air along the G-tube tract.  ED provider saw feculent discharge from the tube site, had blood cultures collected, gave a 500 cc normal saline bolus, and treated the patient with empiric vancomycin.  She was also treated with fentanyl.  She has remained hemodynamically stable, in no apparent respiratory distress, and will be admitted to the medical/surgical unit for ongoing evaluation and management feeding tube complications.  Review of Systems:  All other  systems reviewed and apart from HPI, are negative.  Past Medical History:  Diagnosis Date  . Acute respiratory failure (HCC)   . Anemia    previous blood transfusions  . Arthritis    "all over"  . Asthma   . Bradycardia    requiring previous d/c of BB and reduction of amiodarone  . CAD (coronary artery disease)    nonobstructive per notes  . Chronic diastolic CHF (congestive heart failure) (HCC)   . CKD (chronic kidney disease), stage III (HCC)   . Complication of blood transfusion    "got the wrong blood type at [facility] in ~ 2015; no adverse reaction that we are aware of"/daughter, [name] (01/27/2016)  . COPD (chronic obstructive pulmonary disease) (HCC)   . Depression    "light case"  . DVT (deep venous thrombosis) (HCC) 01/2016   a. LLE DVT 01/2016 - switched from Eliquis to Coumadin.  . Gastric stenosis    a. s/p stomach tube  . GERD (gastroesophageal reflux disease)   . History of blood transfusion    "several" (01/27/2016)  . History of stomach ulcers   . Hyperlipidemia   . Hypertension   . Hypothyroidism   . Paraesophageal hernia   . Perforated gastric ulcer (HCC)   . Seasonal allergies   . SIADH (syndrome of inappropriate ADH production) (HCC)    [name] 01/10/2015  . Small bowel obstruction (HCC)    "I don't know how many" (01/11/2015)  . Stroke (HCC)    "light one"  . Type II diabetes mellitus (HCC)    "related to prednisone use  for >  20 yr; once predinose stopped; no more DM RX" (01/27/2016)  . UTI (urinary tract infection) 02/08/2016  . Ventral hernia with bowel obstruction     Past Surgical History:  Procedure Laterality Date  . Abdominal Aortogram N/A 09/21/2016   Performed by Maeola Harman, MD at [facility] INVASIVE CV LAB  . AMPUTATION DIGIT- LEFT 2ND AND 3RD TOES Left 09/25/2016   Performed by Larina Earthly, MD at [facility] OR  . CATARACT EXTRACTION W/ INTRAOCULAR LENS  IMPLANT, BILATERAL    . CHOLECYSTECTOMY OPEN    . COLECTOMY    .  ESOPHAGOGASTRODUODENOSCOPY (EGD) N/A 07/08/2015   Performed by Sherrilyn Rist, MD at [facility] ENDOSCOPY  . ESOPHAGOGASTRODUODENOSCOPY (EGD) N/A 03/19/2014   Performed by Rachael Fee, MD at [facility] ENDOSCOPY  . ESOPHAGOGASTRODUODENOSCOPY (EGD) N/A 01/20/2014   Performed by Hilarie Fredrickson, MD at [facility] ENDOSCOPY  . ESOPHAGOGASTRODUODENOSCOPY (EGD) N/A 01/19/2014   Performed by Hilarie Fredrickson, MD at [facility] ENDOSCOPY  . ESOPHAGOGASTRODUODENOSCOPY (EGD) WITH PROPOFOL N/A 09/15/2015   Performed by Ruffin Frederick, MD at [facility] ENDOSCOPY  . EXPLORATORY LAPAROTOMY N/A 01/20/2015   Performed by Abigail Miyamoto, MD at [facility] OR  . GASTROJEJUNOSTOMY     hx/notes 01/10/2015  . GLAUCOMA SURGERY Bilateral   . HERNIA REPAIR  2015  . IR CM INJ ANY COLONIC TUBE W/FLUORO  01/05/2017  . IR GENERIC HISTORICAL  01/07/2016   IR GJ TUBE CHANGE 01/07/2016 Malachy Moan, MD [facility]-INTERV RAD  . IR GENERIC HISTORICAL  01/27/2016   IR MECH REMOV OBSTRUC MAT ANY COLON TUBE W/FLUORO 01/27/2016 Richarda Overlie, MD [facility]INTER[facility] RAD  . IR GENERIC HISTORICAL  02/07/2016   IR PATIENT EVAL TECH 0-60 MINS Darrell K Allred, PA-C [facility]INTERV RAD  . IR GENERIC HISTORICAL  02/08/2016   IR GJ TUBE CHANGE 02/08/2016 Berdine Dance, MD [facility]INTER[facility] RAD  . IR GENERIC HISTORICAL  01/06/2016   IR GJ TUBE CHANGE 01/06/2016 [facility]-RAD OUT REF  . IR GENERIC HISTORICAL  05/02/2016   IR CM INJ ANY COLONIC TUBE W/FLUORO 05/02/2016 Oley Balm, MD [facility]INTER[facility] RAD  . IR GENERIC HISTORICAL  05/15/2016   IR GJ TUBE CHANGE 05/15/2016 Simonne Come, MD [facility]INTER[facility] RAD  . IR GENERIC HISTORICAL  06/28/2016   IR GJ TUBE CHANGE 06/28/2016 [facility] RAD  . IR PATIENT EVAL TECH 0-60 MINS  10/19/2016  . IR PATIENT EVAL TECH 0-60 MINS  12/25/2016  . IR PATIENT EVAL TECH 0-60 MINS  01/29/2017  . IR REPLC DUODEN/JEJUNO TUBE PERCUT W/FLUORO  11/14/2016  . Lower Extremity Angiography Left 09/21/2016   Performed by Maeola Harman, MD at [facility] INVASIVE CV LAB  . LYSIS OF ADHESIONS < 1 HOUR N/A  01/20/2015   Performed by Abigail Miyamoto, MD at [facility] OR  . OPEN GASTROJEJUNOSTOMY TUBE PLACEMENT N/A 09/23/2015   Performed by Kinsinger, De Blanch, MD at [facility] ORS  . Peripheral Vascular Balloon Angioplasty Left 09/21/2016   Performed by Maeola Harman, MD at [facility] INVASIVE CV LAB  . TONSILLECTOMY    . TUBAL LIGATION    . VENTRAL HERNIA REPAIR  2015   incarcerated ventral hernia ([facility] 09/2013)/notes 01/10/2015     reports that  has never smoked. She has quit using smokeless tobacco. Her smokeless tobacco use included snuff. She reports that she does not drink alcohol or use drugs.  Allergies  Allergen Reactions  . Penicillins Itching, Rash and Other (See Comments)    Tolerated unasyn, zosyn & cephalosporins in the past.  Has  patient had a PCN reaction causing immediate rash, facial/tongue/throat swelling, SOB or lightheadedness with hypotension: Yes Has patient had a PCN reaction causing severe rash involving mucus membranes or skin necrosis: Unk Has patient had a PCN reaction that required hospitalization: Unk Has patient had a PCN reaction occurring within the last 10 years: Unk If all of the above answers are "NO", then may proceed with Cephalosporins  . Other     Patient tolerates amoxicillin and cephalosporins    Family History  Problem Relation Age of Onset  . Stroke Mother   . Hypertension Mother   . Diabetes Brother   . Glaucoma Son   . Glaucoma Son   . Heart attack Neg Hx      Prior to Admission medications   Medication Sig Start Date End Date Taking? Authorizing Provider  acetaminophen (TYLENOL) 500 MG tablet Take 1,000 mg by mouth every 6 (six) hours as needed (pain).    [provider]  AMINO ACIDS-PROTEIN HYDROLYS PO Take 30 mLs by mouth 2 (two) times daily.    [provider]  atorvastatin (LIPITOR) 10 MG tablet TAKE ONE TABLET BY MOUTH ONCE DAILY 12/22/16   Kirt Boys, DO  b complex vitamins tablet Take 1 tablet by mouth daily.      [provider]  calcium-vitamin D (OSCAL WITH D) 500-200 MG-UNIT per tablet Take 1 tablet by mouth daily with breakfast.     [provider]  carvedilol (COREG) 6.25 MG tablet Take 2 tabs po in AM and 1 tab po in PM for blood pressure 11/03/16   Kirt Boys, DO  cycloSPORINE (RESTASIS) 0.05 % ophthalmic emulsion USE 1 DROP INTO BOTH EYES TWICE DAILY 01/29/17   Kirt Boys, DO  feeding supplement (OSMOLITE 1 CAL) LIQD Take 711 mLs by mouth at bedtime.     [provider]  ferrous sulfate 325 (65 FE) MG tablet TAKE 1 TABLET BY MOUTH EVERY DAY WITH BREAKFAST 12/12/16   Kirt Boys, DO  fluticasone [facility]) 50 MCG/ACT nasal spray Place 2 sprays into both nostrils daily. 08/07/14   Kirt Boys, DO  furosemide (LASIX) 40 MG tablet Take 1 tablet (40 mg total) by mouth daily. 11/13/16 11/11/17  Lars Masson, MD  gabapentin (NEURONTIN) 300 MG capsule TAKE 2 CAPSULES (600 MG TOTAL) BY MOUTH 3 (THREE) TIMES DAILY. 02/06/17   Kirt Boys, DO  hydrALAZINE (APRESOLINE) 25 MG tablet Take 2 tablets in the mornings, 1 tablet in the afternoons, and 1 tablet in the evenings 11/30/16   Lars Masson, MD  Insulin Glargine (LANTUS) 100 UNIT/ML Solostar Pen Injection 0.1 ml (10 units) once daily to control blood sugar. Dx E11.9 11/01/16   Kirt Boys, DO  Insulin Pen Needle (B-D UF III MINI PEN NEEDLES) 31G X 5 MM MISC Use as Directed in giving insulin. Dx E11.9 02/14/17   Kirt Boys, DO  ipratropium-albuterol (DUONEB) 0.5-2.5 (3) MG/3ML SOLN Take 3 mLs by nebulization every 6 (six) hours as needed (sob). Use 3 times daily x 4 days then every 6 hours as needed. 03/21/16   Janetta Hora, PA-C  isosorbide mononitrate (IMDUR) 30 MG 24 hr tablet Take 30 mg by mouth daily. 07/20/16   [provider]  latanoprost (XALATAN) 0.005 % ophthalmic solution Place 1 drop into both eyes at bedtime. 06/02/14   Oneal Grout, MD  levalbuterol [facility]) 45 MCG/ACT inhaler  Inhale 2 puffs into the lungs every 6 (six) hours as needed for wheezing or shortness of breath.  02/03/16   Kirt Boys, DO  levothyroxine (SYNTHROID, LEVOTHROID) 137 MCG tablet TAKE 1 TABLET (137 MCG TOTAL) BY MOUTH DAILY BEFORE BREAKFAST. 02/06/17   Kirt Boys, DO  Maltodextrin-Xanthan Gum (RESOURCE THICKENUP CLEAR) POWD Take 120 g by mouth as needed (use to thicken liquids.). 11/11/15   Rodolph Bong, MD  mometasone-formoterol [facility]) 200-5 MCG/ACT AERO Inhale 2 puffs into the lungs 2 (two) times daily. 10/03/16   Kirt Boys, DO  Multiple Vitamin (MULTIVITAMIN WITH MINERALS) TABS tablet Take 1 tablet by mouth daily.    [provider]  NOVOLOG FLEXPEN 100 UNIT/ML FlexPen BEFORE EACH MEAL 3 TIMES DAILY,140-199-2UNITS,200-250-6 UNITS,300-349-8 UNITS, 350 OR ABOVE 10 UNITS 11/15/16   Kirt Boys, DO  nystatin (MYCOSTATIN) 100000 UNIT/ML suspension 5 ml swish and spit 4 times daily for 1 week then as needed until resolves 01/30/17   Kirt Boys, DO  nystatin (MYCOSTATIN/NYSTOP) powder Apply topically 3 (three) times daily. 09/26/16   Rhetta Mura, MD  oxyCODONE (ROXICODONE) 5 MG immediate release tablet Take 1 tablet (5 mg total) by mouth every 4 (four) hours as needed for severe pain. May take 1/2 tab q4hrs as needed for mild pain. 01/30/17   Kirt Boys, DO  OXYGEN Inhale 2 L into the lungs at bedtime.    [provider]  pantoprazole (PROTONIX) 40 MG tablet Take 1 tablet (40 mg total) 2 (two) times daily by mouth. 02/13/17   Kirt Boys, DO  Polyethyl Glycol-Propyl Glycol 0.4-0.3 % SOLN Place 1 drop into both eyes 4 (four) times daily.     [provider]  sertraline (ZOLOFT) 100 MG tablet Take 100 mg by mouth. Take one tablet each morning for depression    [provider]  sertraline (ZOLOFT) 100 MG tablet TAKE ONE TABLET BY MOUTH ONCE DAILY FOR DEPRESSION 01/31/17   Kirt Boys, DO  warfarin (COUMADIN) 2.5 MG tablet TAKE AS  DIRECTED BY COUMADIN CLINIC. 01/10/17   Lars Masson, MD  Water For Irrigation, Sterile (FREE WATER) SOLN Place 60 mLs into feeding tube 3 (three) times daily.     [provider]  White Petrolatum-Mineral Oil (SYSTANE NIGHTTIME) OINT Apply 1 application to eye at bedtime. Both eyes    [provider]    Physical Exam: Vitals:   02/20/17 0119 02/20/17 0200 02/20/17 0255 02/20/17 0357  BP: (!) 141/58 (!) 127/54 (!) 167/62 (!) 146/62  Pulse: 76 71 80 78  Resp: 16  18 17   Temp: 98.8 F (37.1 C)  98.2 F (36.8 C)   TempSrc: Oral  Oral   SpO2: 97%  95% 98%      Constitutional: NAD, calm, in apparent discomfort Eyes: PERTLA, lids and conjunctivae normal ENMT: Mucous membranes are moist. Posterior pharynx clear of any exudate or lesions.   Neck: normal, supple, no masses, no thyromegaly Respiratory: clear to auscultation bilaterally, no wheezing, no crackles. Normal respiratory effort. No accessory muscle use.  Cardiovascular: Rate ~80 and irregular. Trace LLE edema. No significant JVD. Abdomen: No distension, soft, erythema and induration surrounds feeding tube site with brownish bloody discharge. Bowel sounds appreciated.  Musculoskeletal: no clubbing / cyanosis. No joint deformity upper and lower extremities.  .  Skin: no significant rashes, lesions, ulcers. Poor turgor. Neurologic: No gross facial asymmetry. Sensation intact. Moves all extremitites.  Psychiatric: Alert and oriented x 3. Pleasant and cooperative.     Labs on Admission: I have personally reviewed following labs and imaging studies  CBC: Recent Labs  Lab 02/20/17 0122 02/20/17 0134  WBC 9.4  --   NEUTROABS 5.6  --   HGB 9.0* 9.2*  HCT 27.1* 27.0*  MCV 92.8  --   PLT 228  --    Basic Metabolic Panel: Recent Labs  Lab 02/20/17 0134  NA 143  K 4.1  CL 105  GLUCOSE 123*  BUN 78*  CREATININE 1.80*   GFR: Estimated Creatinine Clearance: 20 mL/min (A) (by C-G formula based on SCr  of 1.8 mg/dL (H)). Liver Function Tests: No results for input(s): AST, ALT, ALKPHOS, BILITOT, PROT, ALBUMIN in the last 168 hours. No results for input(s): LIPASE, AMYLASE in the last 168 hours. No results for input(s): AMMONIA in the last 168 hours. Coagulation Profile: Recent Labs  Lab 02/15/17 1146 02/20/17 0122  INR 3.4 2.88   Cardiac Enzymes: No results for input(s): CKTOTAL, CKMB, CKMBINDEX, TROPONINI in the last 168 hours. BNP (last 3 results) Recent Labs    05/08/16 1053  PROBNP 1,132*   HbA1C: No results for input(s): HGBA1C in the last 72 hours. CBG: No results for input(s): GLUCAP in the last 168 hours. Lipid Profile: No results for input(s): CHOL, HDL, LDLCALC, TRIG, CHOLHDL, LDLDIRECT in the last 72 hours. Thyroid Function Tests: No results for input(s): TSH, T4TOTAL, FREET4, T3FREE, THYROIDAB in the last 72 hours. Anemia Panel: No results for input(s): VITAMINB12, FOLATE, FERRITIN, TIBC, IRON, RETICCTPCT in the last 72 hours. Urine analysis:    Component Value Date/Time   COLORURINE YELLOW 11/02/2016 1854   APPEARANCEUR CLEAR 11/02/2016 1854   LABSPEC 1.006 11/02/2016 1854   PHURINE 8.0 11/02/2016 1854   GLUCOSEU NEGATIVE 11/02/2016 1854   HGBUR NEGATIVE 11/02/2016 1854   BILIRUBINUR NEGATIVE 11/02/2016 1854   KETONESUR NEGATIVE 11/02/2016 1854   PROTEINUR 100 (A) 11/02/2016 1854   UROBILINOGEN 0.2 01/27/2015 2308   NITRITE NEGATIVE 11/02/2016 1854   LEUKOCYTESUR NEGATIVE 11/02/2016 1854   Sepsis Labs: @LABRCNTIP (procalcitonin:4,lacticidven:4) )No results found for this or any previous visit (from the past 240 hour(s)).   Radiological Exams on Admission: Ct Abdomen Pelvis Wo Contrast  Result Date: 02/20/2017 CLINICAL DATA:  Leaking gastrostomy tube with foul odor for a few weeks. Tube came out today. Solid stool coming from the site. History of diabetes and hypertension. EXAM: CT ABDOMEN AND PELVIS WITHOUT CONTRAST TECHNIQUE: Multidetector CT  imaging of the abdomen and pelvis was performed following the standard protocol without IV contrast. COMPARISON:  Abdominal radiographs 01/05/2017 FINDINGS: Lower chest: Cardiac enlargement. Aortic and coronary artery calcifications. Atelectasis or consolidation in the left lung base. This could represent pneumonia in the appropriate clinical setting. Hepatobiliary: Surgical absence of the gallbladder. No bile duct dilatation. Multiple low-attenuation lesions scattered throughout the liver. Largest is in segment 6 and measures about 8 mm in diameter. Liver lesions are indeterminate on noncontrast imaging. If there is history of malignancy, consider MRI for further characterization. Pancreas: Unremarkable. No pancreatic ductal dilatation or surrounding inflammatory changes. Spleen: Normal in size without focal abnormality. Adrenals/Urinary Tract: Adrenal glands are unremarkable. Kidneys are normal, without renal calculi, focal lesion, or hydronephrosis. Bladder is unremarkable. Stomach/Bowel: Diffusely stool-filled colon. Colonic diverticulosis without evidence of diverticulitis. Multiple small bowel diverticula are also present. Stomach, small bowel, and colon are not abnormally distended. Appendix is not identified. Large ventral abdominal wall hernia containing transverse colon and small bowel without evidence of proximal obstruction. Surgical clips along the greater curvature of the stomach. Vascular/Lymphatic: Aortic atherosclerosis. No enlarged abdominal or pelvic lymph nodes. Reproductive: Uterus and bilateral adnexa are unremarkable. Other: Anterior abdominal wall hernias  containing colon, small bowel, and fat. Infiltration and gas along the left upper quadrant anterior abdominal wall extending to the greater curvature of the stomach likely representing the tract from previous gastrostomy tube. No discrete fluid collection to suggest an abscess. No free air or free fluid in the abdomen. Musculoskeletal:  Degenerative changes in the spine. Old anterior compression of T11. No destructive bone lesions. IMPRESSION: 1. Possible pneumonia versus atelectasis in the left lung base. 2. Multiple low-attenuation lesions throughout the liver are indeterminate on noncontrast imaging. If there is history of malignancy, consider MRI for further characterization. 3. Diverticulosis of the colon and small bowel. No inflammatory changes identified. 4. Large ventral abdominal wall hernia containing transverse colon and small bowel without evidence of proximal obstruction. 5. Infiltration and gas along the left upper quadrant anterior abdominal wall representing the previous gastrostomy tube tract. No definite abscess. 6. Aortic atherosclerosis. Electronically Signed   By: Burman Nieves M.D.   On: 02/20/2017 02:54    EKG: Not performed.   Assessment/Plan  1. PEG tube complication  - Pt presents with several days of worsening pain and leakage around the tube site with foul odor; tube was then inadvertently dislodged night of 11/19  - There erythema and induration surrounding the site with a brownish bloody discharge; non-contrast CT abd/pelvis with large ventral hernia and air along PEG tube tract, but no evidence for obstruction or abscess  - Blood cultures were collected in ED and patient was given empiric dose of vancomycin  - Plan to continue supportive care with IVF hydration, prn analgesia, continue abx with meropenem for now, and consult with IR for possible tube replacement    2. Acute kidney injury superimposed on CKD stage III  - SCr is 1.80 on admission, up from 1.0 in August 2018  - Plan to hold Lasix initially, provide gentle IVF hydration, renally-dose medications, avoid nephrotoxins, and repeat chem panel in am    3. Atrial fibrillation  - Rate-controlled on admission  - CHADS-VASc 7 (age x2, gender, CVA x2, DM, HTN) - Continue warfarin and beta-blocker    4. Insulin-dependent DM  - A1c was 7.7%  in March 2018  - Managed at home with Lantus 10 units qD and Novolog 2-10 units TID  - Follow CBGs and continue with Novolog correctional only for now    5. COPD  - No wheezing or dyspnea on admission  - Continue Dulera and prn DuoNebs    6. Hypertension - BP at goal  - Resume Coreg and hydralazine once tube replaced    7. Chronic diastolic CHF  - Appears well-compensated, hypovolemic - Hold Lasix as above while providing gentle IVF hydration - Follow daily wts and I/O's, continue Coreg    8. Anemia  - Hgb is 9.0 on admission, with recent values ranging from 8-11  - No bleeding evident  - Continue to supplement iron and B-vitamins    9. Ventral hernia  - Noted on prior CT, now larger and containing transverse colon and SB without evidence for obstruction on CT  - Continue supportive care with prn analgesia, monitoring for complication   DVT prophylaxis: warfarin Code Status: DNR Family Communication: Daughter updated at bedside Disposition Plan: Observe on med-surg Consults called: None  Admission status: Observation    Briscoe Deutscher, MD [facility] Pager [phone]  If 7PM-7AM, please contact night-coverage [email or web address] Password [facility]  02/20/2017, 4:35 AM

## 2017-02-20 NOTE — ED Notes (Signed)
Bed: WA20 Expected date:  Expected time:  Means of arrival:  Comments: Mop then room 5

## 2017-02-20 NOTE — Progress Notes (Signed)
I have seen and assessed patient and agree with Dr.Opyd's assessment and plan.  Patient is a pleasant 83 year old female history of A. fib on chronic Coumadin therapy, history of CVA, hypothyroidism, hypertension, insulin-dependent diabetes mellitus, chronic diastolic heart failure and PEG tube dependence presented to the ED with malfunctioning PEG tube which subsequently inadvertently got dislodged the night prior to admission.  Thick brownish output was noted from the PEG tube site.  Patient admitted.  Patient noted to be acute on chronic kidney disease stage III.  Patient placed on IV fluids.  Noncontrast CT abdomen and pelvis done showed large ventral hernia and area along PEG tube tract with no evidence of obstruction or abscess noted.  Blood cultures were ordered which are pending.  Patient maintained on IV Merrem.  IR consulted for possible tube replacement.  Patient tolerates only liquids by mouth and states she takes her medications by mouth as well.  While awaiting IR to evaluate for tube replacement will change Synthroid to IV, Coreg to IV Lopressor.  If PEG tube is not able to be replaced today will need to change Coumadin to full dose Lovenox.  Check a urine sodium, urine creatinine, renal ultrasound.  Gentle hydration.  Supportive care.  Follow.  No charge.

## 2017-02-20 NOTE — Progress Notes (Signed)
Pharmacy Antibiotic Note  Shannon [name] is a 83 y.o. female admitted on 02/19/2017 with Intra-abdominal infection.  Pharmacy has been consulted for Meropenem dosing.  Plan: meropenem 1gm iv x1, then 1gm iv q12hr     Temp (24hrs), Avg:98.6 F (37 C), Min:98.2 F (36.8 C), Max:98.8 F (37.1 C)  Recent Labs  Lab 02/20/17 0122 02/20/17 0134  WBC 9.4  --   CREATININE  --  1.80*    Estimated Creatinine Clearance: 20 mL/min (A) (by C-G formula based on SCr of 1.8 mg/dL (H)).    Allergies  Allergen Reactions  . Penicillins Itching, Rash and Other (See Comments)    Tolerated unasyn, zosyn & cephalosporins in the past.  Has patient had a PCN reaction causing immediate rash, facial/tongue/throat swelling, SOB or lightheadedness with hypotension: Yes Has patient had a PCN reaction causing severe rash involving mucus membranes or skin necrosis: Unk Has patient had a PCN reaction that required hospitalization: Unk Has patient had a PCN reaction occurring within the last 10 years: Unk If all of the above answers are "NO", then may proceed with Cephalosporins  . Other     Patient tolerates amoxicillin and cephalosporins    Antimicrobials this admission: Meropenem 02/20/2017 >> Vancomycin 02/20/2017 x1  Dose adjustments this admission: -  Microbiology results: pending  Thank you for allowing pharmacy to be a part of this patient's care.  Shannon [name] 02/20/2017 5:09 AM

## 2017-02-20 NOTE — ED Provider Notes (Signed)
[facility] DEPT Provider Note   CSN: [id number] Arrival date & time: 02/19/17  2227     History   Chief Complaint Chief Complaint  Patient presents with  . Feeding Tube Dislodged    HPI Shannon [name] is a 83 y.o. female.  The history is provided by the patient and a relative.  Illness  This is a new problem. The current episode started 3 to 5 hours ago. The problem occurs constantly. The problem has not changed since onset.Associated symptoms include abdominal pain. Pertinent negatives include no chest pain, no headaches and no shortness of breath. Nothing aggravates the symptoms. Nothing relieves the symptoms. She has tried nothing for the symptoms. The treatment provided no relief.    Past Medical History:  Diagnosis Date  . Acute respiratory failure ([place])   . Anemia    previous blood transfusions  . Arthritis    "all over"  . Asthma   . Bradycardia    requiring previous d/c of BB and reduction of amiodarone  . CAD (coronary artery disease)    nonobstructive per notes  . Chronic diastolic CHF (congestive heart failure) ([place])   . CKD (chronic kidney disease), stage III ([place])   . Complication of blood transfusion    "got the wrong blood type at [facility] in ~ 2015; no adverse reaction that we are aware of"/daughter, [name] (01/27/2016)  . COPD (chronic obstructive pulmonary disease) ([place])   . Depression    "light case"  . DVT (deep venous thrombosis) ([place]) 01/2016   a. LLE DVT 01/2016 - switched from Eliquis to Coumadin.  . Gastric stenosis    a. s/p stomach tube  . GERD (gastroesophageal reflux disease)   . History of blood transfusion    "several" (01/27/2016)  . History of stomach ulcers   . Hyperlipidemia   . Hypertension   . Hypothyroidism   . Paraesophageal hernia   . Perforated gastric ulcer ([place])   . Seasonal allergies   . SIADH (syndrome of inappropriate ADH production) ([place])    [name] 01/10/2015  . Small bowel  obstruction ([place])    "I don't know how many" (01/11/2015)  . Stroke ([place])    "light one"  . Type II diabetes mellitus ([place])    "related to prednisone use  for > 20 yr; once predinose stopped; no more DM RX" (01/27/2016)  . UTI (urinary tract infection) 02/08/2016  . Ventral hernia with bowel obstruction     Patient Active Problem List   Diagnosis Date Noted  . COPD with asthma ([place]) 10/03/2016  . Chronic deep vein thrombosis (DVT) of tibial vein of left lower extremity ([place]) 10/03/2016  . Gangrene of toe of left foot ([place]) 09/15/2016  . Recurrent aspiration pneumonia ([place])   . Atherosclerosis of native arteries of the extremities with gangrene ([place]) 07/28/2016  . Calcium channel blocker adverse reaction 06/08/2016  . Other specified hypothyroidism   . CHF (NYHA class IV, ACC/AHA stage D) ([place])   . Shortness of breath   . Pressure injury of skin 03/11/2016  . PEG (percutaneous endoscopic gastrostomy) status ([place]) 03/10/2016  . Encounter for therapeutic drug monitoring 02/16/2016  . Warfarin anticoagulation   . Chronic seasonal allergic rhinitis   . Atrial flutter ([place]) 01/24/2016  . Chronic diastolic CHF (congestive heart failure) ([place]) 01/24/2016  . Bilateral leg edema 12/29/2015  . Hypothyroidism due to medication   . Weakness   . Pyloric stenosis   . Pressure ulcer 09/15/2015  . Acute  osteomyelitis of toe of right foot ([place])   . PVD (peripheral vascular disease) ([place])   . Adjustment disorder with anxiety 08/29/2015  . Diabetic ulcer of toe of right foot associated with type 2 diabetes mellitus, with necrosis of muscle ([place])   . Diverticulitis of large intestine without perforation or abscess without bleeding   . Colitis 08/26/2015  . Esophageal candidiasis ([place])   . SOB (shortness of breath)   . Encounter for long-term (current) use of other high-risk medications   . Dysphagia 07/05/2015  . Asthma with allergic rhinitis 06/04/2015  . Hypertensive heart disease 03/18/2015    . Malnutrition of moderate degree 01/28/2015  . Syncope 01/27/2015  . Symptomatic bradycardia 01/21/2015  . Hypotension due to drugs 01/21/2015  . Protein-calorie malnutrition, severe 01/12/2015  . Cerebrovascular disease 07/14/2014  . Diabetic polyneuropathy associated with type 2 diabetes mellitus ([place]) 07/14/2014  . Hyperlipidemia 07/14/2014  . Gastric outlet obstruction 05/21/2014  . Unstable gait 04/29/2014  . Slurred speech   . Gastroparesis   . Slow transit constipation 02/03/2014  . Carotid stenosis 02/03/2014  . Demand ischemia ([place]) 01/21/2014  . Gastric bezoar 01/20/2014  . Anemia of chronic disease 01/18/2014  . Chronic kidney insufficiency, stage 3 (moderate) (HCC) 01/18/2014  . Depression 09/29/2013  . Essential hypertension, benign 09/22/2013  . GERD (gastroesophageal reflux disease) 09/22/2013  . Dyslipidemia 09/22/2013  . Diabetes mellitus type 2 in nonobese ([place]) 09/22/2013  . Persistent atrial fibrillation ([place]) 09/22/2013  . CVA (cerebral vascular accident) ([place]) 09/22/2013  . Diabetic neuropathy ([place]) 09/22/2013  . Chronic gastrojejunal ulcer with perforation ([place]) 09/07/2013  . Accelerated hypertension 09/07/2013  . Systemic lupus erythematosus (SLE) inhibitor ([place]) 09/07/2013  . Other specified abnormal immunological findings in serum 09/07/2013    Past Surgical History:  Procedure Laterality Date  . Abdominal Aortogram N/A 09/21/2016   Performed by Waynetta Sandy, MD at [facility]  . AMPUTATION DIGIT- LEFT 2ND AND 3RD TOES Left 09/25/2016   Performed by Rosetta Posner, MD at [facility]  . CATARACT EXTRACTION W/ INTRAOCULAR LENS  IMPLANT, BILATERAL    . CHOLECYSTECTOMY OPEN    . COLECTOMY    . ESOPHAGOGASTRODUODENOSCOPY (EGD) N/A 07/08/2015   Performed by Doran Stabler, MD at [facility]  . ESOPHAGOGASTRODUODENOSCOPY (EGD) N/A 03/19/2014   Performed by Milus Banister, MD at [facility]  . ESOPHAGOGASTRODUODENOSCOPY (EGD) N/A  01/20/2014   Performed by Irene Shipper, MD at [facility]  . ESOPHAGOGASTRODUODENOSCOPY (EGD) N/A 01/19/2014   Performed by Irene Shipper, MD at [facility]  . ESOPHAGOGASTRODUODENOSCOPY (EGD) WITH PROPOFOL N/A 09/15/2015   Performed by Manus Gunning, MD at [facility]  . EXPLORATORY LAPAROTOMY N/A 01/20/2015   Performed by Coralie Keens, MD at [facility]  . GASTROJEJUNOSTOMY     hx/notes 01/10/2015  . GLAUCOMA SURGERY Bilateral   . HERNIA REPAIR  2015  . IR CM INJ ANY COLONIC TUBE W/FLUORO  01/05/2017  . IR GENERIC HISTORICAL  01/07/2016   IR GJ TUBE CHANGE 01/07/2016 Jacqulynn Cadet, MD [facility]  . IR GENERIC HISTORICAL  01/27/2016   IR MECH REMOV OBSTRUC MAT ANY COLON TUBE W/FLUORO 01/27/2016 Markus Daft, MD [facility]  . IR GENERIC HISTORICAL  02/07/2016   IR PATIENT EVAL TECH 0-60 MINS Darrell K Allred, PA-C [facility]  . IR GENERIC HISTORICAL  02/08/2016   IR GJ TUBE CHANGE 02/08/2016 Greggory Keen, MD [facility]  . IR GENERIC HISTORICAL  01/06/2016  IR GJ TUBE CHANGE 01/06/2016 [facility] OUT REF  . IR GENERIC HISTORICAL  05/02/2016   IR CM INJ ANY COLONIC TUBE W/FLUORO 05/02/2016 Arne Cleveland, MD [facility]  . IR GENERIC HISTORICAL  05/15/2016   IR GJ TUBE CHANGE 05/15/2016 Sandi Mariscal, MD [facility]  . IR GENERIC HISTORICAL  06/28/2016   IR GJ TUBE CHANGE 06/28/2016 [facility]  . IR PATIENT EVAL TECH 0-60 MINS  10/19/2016  . IR PATIENT EVAL TECH 0-60 MINS  12/25/2016  . IR PATIENT EVAL TECH 0-60 MINS  01/29/2017  . IR REPLC DUODEN/JEJUNO TUBE PERCUT W/FLUORO  11/14/2016  . Lower Extremity Angiography Left 09/21/2016   Performed by Waynetta Sandy, MD at [facility]  . LYSIS OF ADHESIONS < 1 HOUR N/A 01/20/2015   Performed by Coralie Keens, MD at [facility]  . OPEN GASTROJEJUNOSTOMY TUBE PLACEMENT N/A 09/23/2015   Performed by Kinsinger, Arta Bruce, MD at [facility]  . Peripheral Vascular Balloon Angioplasty Left 09/21/2016   Performed by Waynetta Sandy, MD at [facility]  . TONSILLECTOMY    . TUBAL LIGATION    . VENTRAL HERNIA REPAIR  2015   incarcerated ventral hernia ([facility] 09/2013)/notes 01/10/2015    OB History    No data available       Home Medications    Prior to Admission medications   Medication Sig Start Date End Date Taking? Authorizing Provider  acetaminophen (TYLENOL) 500 MG tablet Take 1,000 mg by mouth every 6 (six) hours as needed (pain).    [provider]  AMINO ACIDS-PROTEIN HYDROLYS PO Take 30 mLs by mouth 2 (two) times daily.    [provider]  atorvastatin (LIPITOR) 10 MG tablet TAKE ONE TABLET BY MOUTH ONCE DAILY 12/22/16   Gildardo Cranker, DO  b complex vitamins tablet Take 1 tablet by mouth daily.     [provider]  calcium-vitamin D (OSCAL WITH D) 500-200 MG-UNIT per tablet Take 1 tablet by mouth daily with breakfast.     [provider]  carvedilol (COREG) 6.25 MG tablet Take 2 tabs po in AM and 1 tab po in PM for blood pressure 11/03/16   Gildardo Cranker, DO  cycloSPORINE (RESTASIS) 0.05 % ophthalmic emulsion USE 1 DROP INTO BOTH EYES TWICE DAILY 01/29/17   Gildardo Cranker, DO  feeding supplement (OSMOLITE 1 CAL) LIQD Take 711 mLs by mouth at bedtime.     [provider]  ferrous sulfate 325 (65 FE) MG tablet TAKE 1 TABLET BY MOUTH EVERY DAY WITH BREAKFAST 12/12/16   Gildardo Cranker, DO  fluticasone [facility]) 50 MCG/ACT nasal spray Place 2 sprays into both nostrils daily. 08/07/14   Gildardo Cranker, DO  furosemide (LASIX) 40 MG tablet Take 1 tablet (40 mg total) by mouth daily. 11/13/16 11/11/17  Dorothy Spark, MD  gabapentin (NEURONTIN) 300 MG capsule TAKE 2 CAPSULES (600 MG TOTAL) BY MOUTH 3 (THREE) TIMES DAILY. 02/06/17   Gildardo Cranker, DO  hydrALAZINE (APRESOLINE) 25 MG tablet Take 2 tablets in the mornings, 1 tablet in the afternoons, and 1 tablet in the evenings 11/30/16   Dorothy Spark, MD  Insulin Glargine (LANTUS) 100 UNIT/ML  Solostar Pen Injection 0.1 ml (10 units) once daily to control blood sugar. Dx E11.9 11/01/16   Gildardo Cranker, DO  Insulin Pen Needle (B-D UF III MINI PEN NEEDLES) 31G X 5 MM MISC Use as Directed in giving insulin. Dx E11.9 02/14/17   Gildardo Cranker, DO  ipratropium-albuterol (DUONEB) 0.5-2.5 (3) MG/3ML SOLN Take 3 mLs by nebulization every 6 (six) hours as needed (sob). Use 3 times daily x 4 days then every 6 hours as needed. 03/21/16   Eileen Stanford, PA-C  isosorbide mononitrate (IMDUR) 30 MG 24 hr tablet Take 30 mg by mouth daily. 07/20/16   [provider]  latanoprost (XALATAN) 0.005 % ophthalmic solution Place 1 drop into both eyes at bedtime. 06/02/14   Blanchie Serve, MD  levalbuterol [facility] HFA) 45 MCG/ACT inhaler Inhale 2 puffs into the lungs every 6 (six) hours as needed for wheezing or shortness of breath. 02/03/16   Gildardo Cranker, DO  levothyroxine (SYNTHROID, LEVOTHROID) 137 MCG tablet TAKE 1 TABLET (137 MCG TOTAL) BY MOUTH DAILY BEFORE BREAKFAST. 02/06/17   Gildardo Cranker, DO  Maltodextrin-Xanthan Gum (RESOURCE THICKENUP CLEAR) POWD Take 120 g by mouth as needed (use to thicken liquids.). 11/11/15   Eugenie Filler, MD  mometasone-formoterol [facility]) 200-5 MCG/ACT AERO Inhale 2 puffs into the lungs 2 (two) times daily. 10/03/16   Gildardo Cranker, DO  Multiple Vitamin (MULTIVITAMIN WITH MINERALS) TABS tablet Take 1 tablet by mouth daily.    [provider]  NOVOLOG FLEXPEN 100 UNIT/ML FlexPen BEFORE EACH MEAL 3 TIMES DAILY,140-199-2UNITS,200-250-6 UNITS,300-349-8 UNITS, 350 OR ABOVE 10 UNITS 11/15/16   Gildardo Cranker, DO  nystatin (MYCOSTATIN) 100000 UNIT/ML suspension 5 ml swish and spit 4 times daily for 1 week then as needed until resolves 01/30/17   Gildardo Cranker, DO  nystatin (MYCOSTATIN/NYSTOP) powder Apply topically 3 (three) times daily. 09/26/16   Nita Sells, MD  oxyCODONE (ROXICODONE) 5 MG immediate release tablet Take 1 tablet (5 mg total) by mouth  every 4 (four) hours as needed for severe pain. May take 1/2 tab q4hrs as needed for mild pain. 01/30/17   Gildardo Cranker, DO  OXYGEN Inhale 2 L into the lungs at bedtime.    [provider]  pantoprazole (PROTONIX) 40 MG tablet Take 1 tablet (40 mg total) 2 (two) times daily by mouth. 02/13/17   Gildardo Cranker, DO  Polyethyl Glycol-Propyl Glycol 0.4-0.3 % SOLN Place 1 drop into both eyes 4 (four) times daily.     [provider]  sertraline (ZOLOFT) 100 MG tablet Take 100 mg by mouth. Take one tablet each morning for depression    [provider]  sertraline (ZOLOFT) 100 MG tablet TAKE ONE TABLET BY MOUTH ONCE DAILY FOR DEPRESSION 01/31/17   Gildardo Cranker, DO  warfarin (COUMADIN) 2.5 MG tablet TAKE AS DIRECTED BY COUMADIN CLINIC. 01/10/17   Dorothy Spark, MD  Water For Irrigation, Sterile (FREE WATER) SOLN Place 60 mLs into feeding tube 3 (three) times daily.     [provider]  White Petrolatum-Mineral Oil (SYSTANE NIGHTTIME) OINT Apply 1 application to eye at bedtime. Both eyes    [provider]    Family History Family History  Problem Relation Age of Onset  . Stroke Mother   . Hypertension Mother   . Diabetes Brother   . Glaucoma Son   . Glaucoma Son   . Heart attack Neg Hx     Social History Social History   Tobacco Use  . Smoking status: Never Smoker  . Smokeless tobacco: Former Systems developer    Types: Snuff  . Tobacco comment: "used snuff in my younger days"  Substance Use Topics  . Alcohol use: No    Alcohol/week: 0.0 oz  . Drug use: No     Allergies   Penicillins and  Other   Review of Systems Review of Systems  Respiratory: Negative for shortness of breath.   Cardiovascular: Negative for chest pain.  Gastrointestinal: Positive for abdominal pain.  Skin: Positive for wound.  Neurological: Negative for headaches.  All other systems reviewed and are negative.    Physical Exam Updated Vital Signs BP (!) 134/59 (BP  Location: Right Arm)   Pulse 70   Temp 98.7 F (37.1 C) (Oral)   Resp 18   SpO2 100%   Physical Exam  Constitutional: She is oriented to person, place, and time. She appears well-developed and well-nourished. No distress.  HENT:  Head: Normocephalic and atraumatic.  Mouth/Throat: No oropharyngeal exudate.  Eyes: Conjunctivae are normal. Pupils are equal, round, and reactive to light.  Neck: Normal range of motion. Neck supple.  Cardiovascular: Normal rate, regular rhythm, normal heart sounds and intact distal pulses.  Pulmonary/Chest: Effort normal and breath sounds normal. No stridor. She has no wheezes. She has no rales.  Abdominal: She exhibits no mass. There is tenderness. There is no rebound and no guarding.  Soft stool coming out of site   Musculoskeletal: Normal range of motion.  Neurological: She is alert and oriented to person, place, and time. She displays normal reflexes.  Skin: Skin is warm and dry. Capillary refill takes less than 2 seconds.  Psychiatric: She has a normal mood and affect.     ED Treatments / Results  Labs (all labs ordered are listed, but only abnormal results are displayed)  Results for orders placed or performed during the hospital encounter of 02/19/17  CBC with Differential/Platelet  Result Value Ref Range   WBC 9.4 4.0 - 10.5 K/uL   RBC 2.92 (L) 3.87 - 5.11 MIL/uL   Hemoglobin 9.0 (L) 12.0 - 15.0 g/dL   HCT 27.1 (L) 36.0 - 46.0 %   MCV 92.8 78.0 - 100.0 fL   MCH 30.8 26.0 - 34.0 pg   MCHC 33.2 30.0 - 36.0 g/dL   RDW 16.2 (H) 11.5 - 15.5 %   Platelets 228 150 - 400 K/uL   Neutrophils Relative % 60 %   Neutro Abs 5.6 1.7 - 7.7 K/uL   Lymphocytes Relative 17 %   Lymphs Abs 1.6 0.7 - 4.0 K/uL   Monocytes Relative 10 %   Monocytes Absolute 1.0 0.1 - 1.0 K/uL   Eosinophils Relative 13 %   Eosinophils Absolute 1.2 (H) 0.0 - 0.7 K/uL   Basophils Relative 0 %   Basophils Absolute 0.0 0.0 - 0.1 K/uL  Protime-INR  Result Value Ref Range    Prothrombin Time 30.0 (H) 11.4 - 15.2 seconds   INR 2.88   I-Stat Chem 8, ED  Result Value Ref Range   Sodium 143 135 - 145 mmol/L   Potassium 4.1 3.5 - 5.1 mmol/L   Chloride 105 101 - 111 mmol/L   BUN 78 (H) 6 - 20 mg/dL   Creatinine, Ser 1.80 (H) 0.44 - 1.00 mg/dL   Glucose, Bld 123 (H) 65 - 99 mg/dL   Calcium, Ion 1.20 1.15 - 1.40 mmol/L   TCO2 28 22 - 32 mmol/L   Hemoglobin 9.2 (L) 12.0 - 15.0 g/dL   HCT 27.0 (L) 36.0 - 46.0 %   Ct Abdomen Pelvis Wo Contrast  Result Date: 02/20/2017 CLINICAL DATA:  Leaking gastrostomy tube with foul odor for a few weeks. Tube came out today. Solid stool coming from the site. History of diabetes and hypertension. EXAM: CT ABDOMEN AND PELVIS WITHOUT CONTRAST  TECHNIQUE: Multidetector CT imaging of the abdomen and pelvis was performed following the standard protocol without IV contrast. COMPARISON:  Abdominal radiographs 01/05/2017 FINDINGS: Lower chest: Cardiac enlargement. Aortic and coronary artery calcifications. Atelectasis or consolidation in the left lung base. This could represent pneumonia in the appropriate clinical setting. Hepatobiliary: Surgical absence of the gallbladder. No bile duct dilatation. Multiple low-attenuation lesions scattered throughout the liver. Largest is in segment 6 and measures about 8 mm in diameter. Liver lesions are indeterminate on noncontrast imaging. If there is history of malignancy, consider MRI for further characterization. Pancreas: Unremarkable. No pancreatic ductal dilatation or surrounding inflammatory changes. Spleen: Normal in size without focal abnormality. Adrenals/Urinary Tract: Adrenal glands are unremarkable. Kidneys are normal, without renal calculi, focal lesion, or hydronephrosis. Bladder is unremarkable. Stomach/Bowel: Diffusely stool-filled colon. Colonic diverticulosis without evidence of diverticulitis. Multiple small bowel diverticula are also present. Stomach, small bowel, and colon are not abnormally  distended. Appendix is not identified. Large ventral abdominal wall hernia containing transverse colon and small bowel without evidence of proximal obstruction. Surgical clips along the greater curvature of the stomach. Vascular/Lymphatic: Aortic atherosclerosis. No enlarged abdominal or pelvic lymph nodes. Reproductive: Uterus and bilateral adnexa are unremarkable. Other: Anterior abdominal wall hernias containing colon, small bowel, and fat. Infiltration and gas along the left upper quadrant anterior abdominal wall extending to the greater curvature of the stomach likely representing the tract from previous gastrostomy tube. No discrete fluid collection to suggest an abscess. No free air or free fluid in the abdomen. Musculoskeletal: Degenerative changes in the spine. Old anterior compression of T11. No destructive bone lesions. IMPRESSION: 1. Possible pneumonia versus atelectasis in the left lung base. 2. Multiple low-attenuation lesions throughout the liver are indeterminate on noncontrast imaging. If there is history of malignancy, consider MRI for further characterization. 3. Diverticulosis of the colon and small bowel. No inflammatory changes identified. 4. Large ventral abdominal wall hernia containing transverse colon and small bowel without evidence of proximal obstruction. 5. Infiltration and gas along the left upper quadrant anterior abdominal wall representing the previous gastrostomy tube tract. No definite abscess. 6. Aortic atherosclerosis. Electronically Signed   By: Lucienne Capers M.D.   On: 02/20/2017 02:54   Ir Patient Eval Tech 0-60 Mins  Result Date: 01/29/2017 Chipper Oman     01/29/2017  3:21 PM Patient came in today with complaint that feeding pump would not stay in the feeding tube.  Additionally, she has persistent pain around and below the j tube site.  I evaluated the tube and saw no signs of the tube wearing out or problems with the hub.  I advised the patient and her daughter  that I could add a lopez valve that would help keep the feeding pump in place.  The patient was not happy with the additional weight of the valve but understood that was the only way to prevent the pump from disconnecting during the night.  She also asked if I could reapply the silver nitrate to the insertion site as previously done.  Ascencion Dike [facility] evaluated the site and I proceeded to apply the silver nitrate as requested.  The patient has an appointment to follow up with her primary care physician tomorrow regarding the  continual pain that is not associated with the insertion site or feeding tube.  She will call us if she has any further concerns    Procedures Procedures (including critical care time)  Medications Ordered in ED  Medications  vancomycin (VANCOCIN) IVPB 1000  mg/200 mL premix (not administered)  fentaNYL (SUBLIMAZE) injection 50 mcg (50 mcg Intravenous Given 02/20/17 0122)  sodium chloride 0.9 % bolus 500 mL (500 mLs Intravenous New Bag/Given 02/20/17 0355)      Final Clinical Impressions(s) / ED Diagnoses   Wound infection:  There is stool and foul smelling drainage from the site.  I am concerned about fistulous connection between stomach and large bowel.  We were unable to give IV contrast secondary to patient's rise in creatinine which is new.  Will admit to medicine for further investigation.  She is allergic to PCN so cannot give zosyn and creatinine is too elevated to give levaquin   Braylee Lal, MD 02/20/17 [id number]

## 2017-02-20 NOTE — Progress Notes (Signed)
ANTICOAGULATION CONSULT NOTE - Initial Consult  Pharmacy Consult for warfarin Indication: atrial fibrillation and stroke  Allergies  Allergen Reactions  . Penicillins Itching, Rash and Other (See Comments)    Tolerated unasyn, zosyn & cephalosporins in the past.  Has patient had a PCN reaction causing immediate rash, facial/tongue/throat swelling, SOB or lightheadedness with hypotension: Yes Has patient had a PCN reaction causing severe rash involving mucus membranes or skin necrosis: Unk Has patient had a PCN reaction that required hospitalization: Unk Has patient had a PCN reaction occurring within the last 10 years: Unk If all of the above answers are "NO", then may proceed with Cephalosporins  . Other     Patient tolerates amoxicillin and cephalosporins    Patient Measurements: Weight: 150 lb 9.2 oz (68.3 kg) Heparin Dosing Weight:   Vital Signs: Temp: 98.3 F (36.8 C) (11/20 0604) Temp Source: Oral (11/20 0604) BP: 125/99 (11/20 0604) Pulse Rate: 75 (11/20 0604)  Labs: Recent Labs    02/20/17 0122 02/20/17 0134  HGB 9.0* 9.2*  HCT 27.1* 27.0*  PLT 228  --   LABPROT 30.0*  --   INR 2.88  --   CREATININE  --  1.80*    Estimated Creatinine Clearance: 18.3 mL/min (A) (by C-G formula based on SCr of 1.8 mg/dL (H)).   Medical History: Past Medical History:  Diagnosis Date  . Acute respiratory failure ([place])   . Anemia    previous blood transfusions  . Arthritis    "all over"  . Asthma   . Bradycardia    requiring previous d/c of BB and reduction of amiodarone  . CAD (coronary artery disease)    nonobstructive per notes  . Chronic diastolic CHF (congestive heart failure) ([place])   . CKD (chronic kidney disease), stage III ([place])   . Complication of blood transfusion    "got the wrong blood type at [facility] in ~ 2015; no adverse reaction that we are aware of"/daughter, [name] (01/27/2016)  . COPD (chronic obstructive pulmonary disease) ([place])   . Depression    "light case"  . DVT (deep venous thrombosis) ([place]) 01/2016   a. LLE DVT 01/2016 - switched from Eliquis to Coumadin.  . Gastric stenosis    a. s/p stomach tube  . GERD (gastroesophageal reflux disease)   . History of blood transfusion    "several" (01/27/2016)  . History of stomach ulcers   . Hyperlipidemia   . Hypertension   . Hypothyroidism   . Paraesophageal hernia   . Perforated gastric ulcer ([place])   . Seasonal allergies   . SIADH (syndrome of inappropriate ADH production) ([place])    [name] 01/10/2015  . Small bowel obstruction ([place])    "I don't know how many" (01/11/2015)  . Stroke ([place])    "light one"  . Type II diabetes mellitus ([place])    "related to prednisone use  for > 20 yr; once predinose stopped; no more DM RX" (01/27/2016)  . UTI (urinary tract infection) 02/08/2016  . Ventral hernia with bowel obstruction     Medications:  Medications Prior to Admission  Medication Sig Dispense Refill Last Dose  . acetaminophen (TYLENOL) 500 MG tablet Take 1,000 mg by mouth every 6 (six) hours as needed (pain).   Taking  . AMINO ACIDS-PROTEIN HYDROLYS PO Take 30 mLs by mouth 2 (two) times daily.   Taking  . atorvastatin (LIPITOR) 10 MG tablet TAKE ONE TABLET BY MOUTH ONCE DAILY 90 tablet 1 Taking  . b complex  vitamins tablet Take 1 tablet by mouth daily.    Taking  . calcium-vitamin D (OSCAL WITH D) 500-200 MG-UNIT per tablet Take 1 tablet by mouth daily with breakfast.    Taking  . carvedilol (COREG) 6.25 MG tablet Take 2 tabs po in AM and 1 tab po in PM for blood pressure 90 tablet 6 Taking  . cycloSPORINE (RESTASIS) 0.05 % ophthalmic emulsion USE 1 DROP INTO BOTH EYES TWICE DAILY 60 mL 3 Taking  . feeding supplement (OSMOLITE 1 CAL) LIQD Take 711 mLs by mouth at bedtime.    Taking  . ferrous sulfate 325 (65 FE) MG tablet TAKE 1 TABLET BY MOUTH EVERY DAY WITH BREAKFAST 30 tablet 6 Taking  . fluticasone (FLONASE) 50 MCG/ACT nasal spray Place 2 sprays into both nostrils daily. 16 g 3  Taking  . furosemide (LASIX) 40 MG tablet Take 1 tablet (40 mg total) by mouth daily. 30 tablet 11 Taking  . gabapentin (NEURONTIN) 300 MG capsule TAKE 2 CAPSULES (600 MG TOTAL) BY MOUTH 3 (THREE) TIMES DAILY. 180 capsule 1 Taking  . hydrALAZINE (APRESOLINE) 25 MG tablet Take 2 tablets in the mornings, 1 tablet in the afternoons, and 1 tablet in the evenings 120 tablet 11 Taking  . Insulin Glargine (LANTUS) 100 UNIT/ML Solostar Pen Injection 0.1 ml (10 units) once daily to control blood sugar. Dx E11.9 15 mL 5 Taking  . Insulin Pen Needle (B-D UF III MINI PEN NEEDLES) 31G X 5 MM MISC Use as Directed in giving insulin. Dx E11.9 100 each 3   . ipratropium-albuterol (DUONEB) 0.5-2.5 (3) MG/3ML SOLN Take 3 mLs by nebulization every 6 (six) hours as needed (sob). Use 3 times daily x 4 days then every 6 hours as needed. 360 mL 4 Taking  . isosorbide mononitrate (IMDUR) 30 MG 24 hr tablet Take 30 mg by mouth daily.   Taking  . latanoprost (XALATAN) 0.005 % ophthalmic solution Place 1 drop into both eyes at bedtime. 2.5 mL 12 Taking  . levalbuterol (XOPENEX HFA) 45 MCG/ACT inhaler Inhale 2 puffs into the lungs every 6 (six) hours as needed for wheezing or shortness of breath. 1 Inhaler 12 Taking  . levothyroxine (SYNTHROID, LEVOTHROID) 137 MCG tablet TAKE 1 TABLET (137 MCG TOTAL) BY MOUTH DAILY BEFORE BREAKFAST. 30 tablet 2 Taking  . Maltodextrin-Xanthan Gum (RESOURCE THICKENUP CLEAR) POWD Take 120 g by mouth as needed (use to thicken liquids.). 20 Can 0 Taking  . mometasone-formoterol (DULERA) 200-5 MCG/ACT AERO Inhale 2 puffs into the lungs 2 (two) times daily. 13 g 6 Taking  . Multiple Vitamin (MULTIVITAMIN WITH MINERALS) TABS tablet Take 1 tablet by mouth daily.   Taking  . NOVOLOG FLEXPEN 100 UNIT/ML FlexPen BEFORE EACH MEAL 3 TIMES DAILY,140-199-2UNITS,200-250-6 UNITS,300-349-8 UNITS, 350 OR ABOVE 10 UNITS 15 pen 6 Taking  . nystatin (MYCOSTATIN) 100000 UNIT/ML suspension 5 ml swish and spit 4 times  daily for 1 week then as needed until resolves 240 mL 1 Taking  . nystatin (MYCOSTATIN/NYSTOP) powder Apply topically 3 (three) times daily. 15 g 0 Taking  . oxyCODONE (ROXICODONE) 5 MG immediate release tablet Take 1 tablet (5 mg total) by mouth every 4 (four) hours as needed for severe pain. May take 1/2 tab q4hrs as needed for mild pain. 30 tablet 0 Taking  . OXYGEN Inhale 2 L into the lungs at bedtime.   Taking  . pantoprazole (PROTONIX) 40 MG tablet Take 1 tablet (40 mg total) 2 (two) times daily by mouth. 60 tablet  3   . Polyethyl Glycol-Propyl Glycol 0.4-0.3 % SOLN Place 1 drop into both eyes 4 (four) times daily.    Taking  . sertraline (ZOLOFT) 100 MG tablet Take 100 mg by mouth. Take one tablet each morning for depression   Taking  . sertraline (ZOLOFT) 100 MG tablet TAKE ONE TABLET BY MOUTH ONCE DAILY FOR DEPRESSION 90 tablet 0 Taking  . warfarin (COUMADIN) 2.5 MG tablet TAKE AS DIRECTED BY COUMADIN CLINIC. 65 tablet 1 Taking  . Water For Irrigation, Sterile (FREE WATER) SOLN Place 60 mLs into feeding tube 3 (three) times daily.    Taking  . White Petrolatum-Mineral Oil (SYSTANE NIGHTTIME) OINT Apply 1 application to eye at bedtime. Both eyes   Taking   Scheduled:  . artificial tears  1 application Both Eyes QHS  . atorvastatin  10 mg Oral q1800  . B-complex with vitamin C  1 tablet Oral Daily  . calcium-vitamin D  1 tablet Oral Q breakfast  . carvedilol  12.5 mg Oral Daily  . carvedilol  6.25 mg Oral QHS  . cycloSPORINE  1 drop Both Eyes BID  . feeding supplement (ENSURE ENLIVE)  711 mL Oral QHS  . ferrous sulfate  325 mg Oral Q breakfast  . fluticasone  2 spray Each Nare Daily  . free water  60 mL Per Tube TID  . gabapentin  600 mg Oral TID  . hydrALAZINE  25 mg Oral 2 times per day  . hydrALAZINE  50 mg Oral QAC breakfast  . insulin aspart  0-15 Units Subcutaneous Q4H  . isosorbide mononitrate  30 mg Oral Daily  . latanoprost  1 drop Both Eyes QHS  . levothyroxine  137 mcg  Oral QAC breakfast  . mometasone-formoterol  2 puff Inhalation BID  . multivitamin with minerals  1 tablet Oral Daily  . pantoprazole  40 mg Oral BID  . polyvinyl alcohol  1 drop Both Eyes QID  . sertraline  100 mg Oral Daily  . warfarin  5 mg Oral ONCE-1800  . Warfarin - Pharmacist Dosing Inpatient   Does not apply q1800    Assessment: Patient on warfarin for hx of afib and CVA.   Warfarin PTA per warfarin clinic: 7.5 mg (2.5 mg x 3) every Mon, Wed, Fri; 5 mg (2.5 mg x 2) all other days INR on admit = 2.88  INR at goal, but was > 3 on 11/15.  Goal of Therapy:  INR 2-3    Plan:  Warfarin 5mg  po x1 at 1800 Daily INR  [name] 02/20/2017,6:13 AM

## 2017-02-20 NOTE — Progress Notes (Addendum)
CRITICAL VALUE ALERT  Critical Value:  54  Date & Time Notied:  02/20/17 1215  Provider Notified: [id number]  Orders Received/Actions taken: Pt NPO, had TF, admin 64ml D50 IVP

## 2017-02-21 ENCOUNTER — Telehealth: Payer: Self-pay

## 2017-02-21 DIAGNOSIS — K9423 Gastrostomy malfunction: Secondary | ICD-10-CM

## 2017-02-21 LAB — CBC WITH DIFFERENTIAL/PLATELET
Basophils Absolute: 0.1 10*3/uL (ref 0.0–0.1)
Basophils Relative: 1 %
Eosinophils Absolute: 1.1 10*3/uL — ABNORMAL HIGH (ref 0.0–0.7)
Eosinophils Relative: 17 %
HCT: 27 % — ABNORMAL LOW (ref 36.0–46.0)
Hemoglobin: 8.8 g/dL — ABNORMAL LOW (ref 12.0–15.0)
Lymphocytes Relative: 17 %
Lymphs Abs: 1.1 10*3/uL (ref 0.7–4.0)
MCH: 30.7 pg (ref 26.0–34.0)
MCHC: 32.6 g/dL (ref 30.0–36.0)
MCV: 94.1 fL (ref 78.0–100.0)
Monocytes Absolute: 0.9 10*3/uL (ref 0.1–1.0)
Monocytes Relative: 14 %
Neutro Abs: 3.3 10*3/uL (ref 1.7–7.7)
Neutrophils Relative %: 51 %
Platelets: 233 10*3/uL (ref 150–400)
RBC: 2.87 MIL/uL — ABNORMAL LOW (ref 3.87–5.11)
RDW: 16.4 % — ABNORMAL HIGH (ref 11.5–15.5)
WBC: 6.4 10*3/uL (ref 4.0–10.5)

## 2017-02-21 LAB — BASIC METABOLIC PANEL
Anion gap: 4 — ABNORMAL LOW (ref 5–15)
BUN: 53 mg/dL — ABNORMAL HIGH (ref 6–20)
CO2: 26 mmol/L (ref 22–32)
Calcium: 9 mg/dL (ref 8.9–10.3)
Chloride: 114 mmol/L — ABNORMAL HIGH (ref 101–111)
Creatinine, Ser: 1.3 mg/dL — ABNORMAL HIGH (ref 0.44–1.00)
GFR calc Af Amer: 40 mL/min — ABNORMAL LOW (ref >60)
GFR calc non Af Amer: 35 mL/min — ABNORMAL LOW (ref >60)
Glucose, Bld: 138 mg/dL — ABNORMAL HIGH (ref 65–99)
Potassium: 4.4 mmol/L (ref 3.5–5.1)
Sodium: 144 mmol/L (ref 135–145)

## 2017-02-21 LAB — PROTIME-INR
INR: 3.19
Prothrombin Time: 32.5 seconds — ABNORMAL HIGH (ref 11.4–15.2)

## 2017-02-21 LAB — GLUCOSE, CAPILLARY
Glucose-Capillary: 138 mg/dL — ABNORMAL HIGH (ref 65–99)
Glucose-Capillary: 126 mg/dL — ABNORMAL HIGH (ref 65–99)
Glucose-Capillary: 173 mg/dL — ABNORMAL HIGH (ref 65–99)
Glucose-Capillary: 129 mg/dL — ABNORMAL HIGH (ref 65–99)
Glucose-Capillary: 96 mg/dL (ref 65–99)

## 2017-02-21 MED ORDER — OSMOLITE 1.5 CAL PO LIQD
1000.0000 mL | ORAL | Status: DC
  Administered 2017-02-22: 1000 mL
  Filled 2017-02-21 (×2): qty 1000

## 2017-02-21 MED ORDER — NEPRO/CARBSTEADY PO LIQD
237.0000 mL | Freq: Two times a day (BID) | ORAL | Status: DC
  Administered 2017-02-21 – 2017-02-23 (×5): 237 mL via ORAL
  Filled 2017-02-21 (×5): qty 237

## 2017-02-21 MED ORDER — JEVITY 1.2 CAL PO LIQD: 1000.0000 mL | ORAL | Status: DC

## 2017-02-21 NOTE — Progress Notes (Signed)
PROGRESS NOTE  Shannon [name] WUJ:[id number] DOB: Aug 26, 1923 DOA: 02/19/2017 PCP: Kirt Boys, DO  HPI/Recap of past 24 hours:  Feeding tube continue leaking with clear fluids, patient feeling feeding tube is still irritating No b/v, no fever, Daughter at bedside  Assessment/Plan: Principal Problem:   Complication of feeding tube (HCC) Active Problems:   Diabetes mellitus type 2 in nonobese (HCC)   Persistent atrial fibrillation (HCC)   History of completed stroke   Anemia of chronic disease   Chronic kidney insufficiency, stage 3 (moderate) (HCC)   Accelerated hypertension   Hypothyroidism due to medication   Chronic diastolic CHF (congestive heart failure) (HCC)   COPD with asthma (HCC)   Acute kidney injury superimposed on CKD (HCC)   Abdominal wall cellulitis   1. PEG tube complication  - Pt presents with several days of worsening pain and leakage around the tube site with foul odor; tube was then inadvertently dislodged night of 11/19  - There erythema and induration surrounding the site with a brownish bloody discharge; non-contrast CT abd/pelvis with large ventral hernia and air along PEG tube tract, but no evidence for obstruction or abscess  - Blood cultures were collected in ED and patient was given empiric dose of vancomycin  - Plan to continue supportive care with IVF hydration, prn analgesia, continue abx with meropenem for now, and consult with IR for possible tube replacement    2. Acute kidney injury superimposed on CKD stage III  - SCr is 1.80 on admission, up from 1.0 in August 2018  - Plan to hold Lasix initially, provide gentle IVF hydration, renally-dose medications, avoid nephrotoxins, -cr improving, ivf for another 24hrs  3. Atrial fibrillation  - Rate-controlled on admission  - CHADS-VASc 7 (age x2, gender, CVA x2, DM, HTN) - Continue warfarin and beta-blocker    4. Insulin-dependent DM  - A1c was 7.7% in March 2018  - Managed at home with  Lantus 10 units qD and Novolog 2-10 units TID  - Follow CBGs and continue with Novolog correctional only for now    5. COPD  - No wheezing or dyspnea on admission  - Continue Dulera and prn DuoNebs    6. Hypertension - BP at goal  - Resume Coreg and hydralazine once tube replaced    7. Chronic diastolic CHF  - Appears well-compensated, hypovolemic - Hold Lasix as above while providing gentle IVF hydration - Follow daily wts and I/O's, continue Coreg    8. Anemia  - Hgb is 9.0 on admission, with recent values ranging from 8-11  - No bleeding evident  - Continue to supplement iron and B-vitamins    9. Ventral hernia  - Noted on prior CT, now larger and containing transverse colon and SB without evidence for obstruction on CT  - Continue supportive care with prn analgesia, monitoring for complication   DVT prophylaxis: warfarin Code Status: DNR Family Communication: Daughter updated at bedside Disposition Plan: home once able to tolerate tube feeds and no significant leakage from feeding tube exit site Consults called: IR   Procedures:  GJ tube exchange  Antibiotics:  meropenm   Objective: BP (!) 155/77 (BP Location: Right Arm)   Pulse 67   Temp 98.6 F (37 C) (Oral)   Resp 14   Wt 68.6 kg (151 lb 3.8 oz)   SpO2 97%   BMI 25.17 kg/m   Intake/Output Summary (Last 24 hours) at 02/21/2017 0843 Last data filed at 02/21/2017 0359 Gross per 24 hour  Intake 698.75 ml  Output -  Net 698.75 ml   Filed Weights   02/20/17 0604 02/21/17 0358  Weight: 68.3 kg (150 lb 9.2 oz) 68.6 kg (151 lb 3.8 oz)    Exam: Patient is examined daily including today on 02/21/2017, exams remain the same as of yesterday except that has changed    General:  NAD  Cardiovascular: RRR  Respiratory: CTABL  Abdomen: Soft/ND/NT, positive BS  Musculoskeletal: No Edema  Neuro: alert, oriented   Data Reviewed: Basic Metabolic Panel: Recent Labs  Lab 02/20/17 0134  02/21/17 0440  NA 143 144  K 4.1 4.4  CL 105 114*  CO2  --  26  GLUCOSE 123* 138*  BUN 78* 53*  CREATININE 1.80* 1.30*  CALCIUM  --  9.0   Liver Function Tests: No results for input(s): AST, ALT, ALKPHOS, BILITOT, PROT, ALBUMIN in the last 168 hours. No results for input(s): LIPASE, AMYLASE in the last 168 hours. No results for input(s): AMMONIA in the last 168 hours. CBC: Recent Labs  Lab 02/20/17 0122 02/20/17 0134 02/21/17 0440  WBC 9.4  --  6.4  NEUTROABS 5.6  --  3.3  HGB 9.0* 9.2* 8.8*  HCT 27.1* 27.0* 27.0*  MCV 92.8  --  94.1  PLT 228  --  233   Cardiac Enzymes:   No results for input(s): CKTOTAL, CKMB, CKMBINDEX, TROPONINI in the last 168 hours. BNP (last 3 results) Recent Labs    06/08/16 1523 08/12/16 1400 09/14/16 2140  BNP 1,268.6* 92.2 226.1*    ProBNP (last 3 results) Recent Labs    05/08/16 1053  PROBNP 1,132*    CBG: Recent Labs  Lab 02/20/17 1823 02/20/17 2007 02/20/17 2350 02/21/17 0357 02/21/17 0733  GLUCAP 125* 132* 114* 138* 126*    Recent Results (from the past 240 hour(s))  MRSA PCR Screening     Status: None   Collection Time: 02/20/17  6:00 AM  Result Value Ref Range Status   MRSA by PCR NEGATIVE NEGATIVE Final    Comment:        The GeneXpert MRSA Assay (FDA approved for NASAL specimens only), is one component of a comprehensive MRSA colonization surveillance program. It is not intended to diagnose MRSA infection nor to guide or monitor treatment for MRSA infections.      Studies: US Renal  Result Date: 02/20/2017 CLINICAL DATA:  Acute renal failure. EXAM: RENAL / URINARY TRACT ULTRASOUND COMPLETE COMPARISON:  CT 02/20/2017 . FINDINGS: Right Kidney: Length: 8.8 cm. Echogenicity within normal limits. Lobular contour. No mass or hydronephrosis visualized. Left Kidney: Length: 8.4 cm. Echogenicity within normal limits. Lobular contour. No mass or hydronephrosis visualized. Bladder: Appears normal for degree of  bladder distention. IMPRESSION: 1. Bilateral small kidneys suggesting some degree of atrophy. Bilateral renal scarring. 2. No acute abnormality.  No hydronephrosis or bladder distention . Electronically Signed   By: Maisie Fus  Register   On: 02/20/2017 14:31   Ir Gj Tube Change  Result Date: 02/20/2017 INDICATION: 83 year old female with a history of a percutaneous gastro jejunostomy tube which has recently become displaced. Additionally, she has cellulitis of the soft tissue tract. EXAM: JEJUNAL CATHETER REPLACEMENT MEDICATIONS: None. ANESTHESIA/SEDATION: None. CONTRAST:  15mL ISOVUE-300 IOPAMIDOL (ISOVUE-300) INJECTION 61%, 15mL ISOVUE-300 IOPAMIDOL (ISOVUE-300) INJECTION 61% - administered into the gastric lumen. FLUOROSCOPY TIME:  Fluoroscopy Time: 3 minutes 54 seconds (35 mGy). COMPLICATIONS: None immediate. PROCEDURE: Informed written consent was obtained from the patient after a thorough discussion of the procedural risks, benefits  and alternatives. All questions were addressed. Maximal Sterile Barrier Technique was utilized including caps, mask, sterile gowns, sterile gloves, sterile drape, hand hygiene and skin antiseptic. A timeout was performed prior to the initiation of the procedure. An angled catheter and hydrophilic wire were navigated through the soft tissue tract and into the stomach. Contrast material was injected demonstrating expected postsurgical changes of the stomach. There is no evidence of fistulization with adjacent colon or small bowel. The catheter was successfully navigated into the duodenum, through the duodenum and into the proximal jejunum. The hydrophilic wire was removed and replaced with a stiff Amplatz wire. A 5 French catheter was removed. A new 22 French gastro jejunostomy tube was then advanced over the wire and positioned with the distal tip in the proximal jejunum. This was consist firm to by contrast injection under fluoroscopy. An image was saved. The catheter was then  flushed with saline. The retention balloon was inflated with 9 mL water and pulled tight against the anterior abdominal wall. IMPRESSION: Successful replacement of a new 22 French gastrojejunostomy tube. The J arm tip terminates in the proximal small bowel and is ready for immediate use. Signed, Sterling Big, MD [facility] Radiology Electronically Signed   By: Malachy Moan M.D.   On: 02/20/2017 14:17    Scheduled Meds: . acetaminophen (TYLENOL) oral liquid 160 mg/5 mL  325 mg Oral TID WC  . artificial tears  1 application Both Eyes QHS  . atorvastatin  10 mg Oral q1800  . B-complex with vitamin C  1 tablet Oral Daily  . calcium-vitamin D  1 tablet Oral Q breakfast  . carvedilol  12.5 mg Oral q1800  . carvedilol  25 mg Oral Q breakfast  . cycloSPORINE  1 drop Both Eyes BID  . feeding supplement (ENSURE ENLIVE)  711 mL Oral QHS  . ferrous sulfate  325 mg Oral Q breakfast  . fluticasone  2 spray Each Nare Daily  . free water  60 mL Per Tube TID  . gabapentin  600 mg Oral TID  . hydrALAZINE  25 mg Oral 2 times per day  . hydrALAZINE  50 mg Oral QAC breakfast  . insulin aspart  0-15 Units Subcutaneous Q4H  . isosorbide mononitrate  30 mg Oral Daily  . latanoprost  1 drop Both Eyes QHS  . levothyroxine  137 mcg Oral QAC breakfast  . mometasone-formoterol  2 puff Inhalation BID  . multivitamin with minerals  1 tablet Oral Daily  . pantoprazole  40 mg Oral BID  . polyvinyl alcohol  1 drop Both Eyes QID  . sertraline  100 mg Oral Daily  . tiotropium  18 mcg Inhalation Daily  . Warfarin - Pharmacist Dosing Inpatient   Does not apply q1800    Continuous Infusions: . dextrose 5 % and 0.9% NaCl 75 mL/hr at 02/20/17 2000  . meropenem (MERREM) IV Stopped (02/21/17 0600)     Time spent: 25 mins I have personally reviewed and interpreted on  02/21/2017 daily labs,  imagings as discussed above under date review session and assessment  and plans.  I reviewed all nursing notes, pharmacy notes, consultant notes,  vitals, pertinent old records  I have discussed plan of care as described above with RN , patient and family on 02/21/2017   Albertine Grates MD, PhD  [facility] Pager [phone]. If 7PM-7AM, please contact night-coverage at [email or web address], password [facility] 02/21/2017, 8:43 AM  LOS: 1 day

## 2017-02-21 NOTE — Progress Notes (Signed)
ANTICOAGULATION CONSULT NOTE - Follow Up Consult  Pharmacy Consult for warfarin Indication: atrial fibrillation and stroke    Allergies  Allergen Reactions  . Penicillins Itching, Rash and Other (See Comments)    Tolerated unasyn, zosyn & cephalosporins in the past.  Has patient had a PCN reaction causing immediate rash, facial/tongue/throat swelling, SOB or lightheadedness with hypotension: Yes Has patient had a PCN reaction causing severe rash involving mucus membranes or skin necrosis: Unk Has patient had a PCN reaction that required hospitalization: Unk Has patient had a PCN reaction occurring within the last 10 years: Unk If all of the above answers are "NO", then may proceed with Cephalosporins  . Other     Patient tolerates amoxicillin and cephalosporins    Patient Measurements: Weight: 151 lb 3.8 oz (68.6 kg)   Vital Signs: Temp: 98.6 F (37 C) (11/21 0358) Temp Source: Oral (11/21 0358) BP: 155/77 (11/21 0358) Pulse Rate: 67 (11/21 0358)  Labs: Recent Labs    02/20/17 0122 02/20/17 0134 02/21/17 0440  HGB 9.0* 9.2* 8.8*  HCT 27.1* 27.0* 27.0*  PLT 228  --  233  LABPROT 30.0*  --  32.5*  INR 2.88  --  3.19  CREATININE  --  1.80* 1.30*    Estimated Creatinine Clearance: 27.4 mL/min (A) (by C-G formula based on SCr of 1.3 mg/dL (H)).    Assessment: Pt on warfarin for hx of afib and CVA.  Warfarin PTA per warfarin clinic 7.5mg  every Mon, Wed, Fri: 5mg  all other days.  INR on admit 2.88  02/21/2017 INR 3.19 supratherapeutic Pt was NPO yesterday for peg tube replacement H/H low  Plts WNL  Goal of Therapy:  INR 2-3   Plan:  Hold warfarin today Daily INR  Dolly Rias RPh 02/21/2017, 9:55 AM Pager [phone]

## 2017-02-21 NOTE — Telephone Encounter (Signed)
Incoming fax received from [facility] requesting clarification on Novolog, ? How many units should patient inject if blood sugar is between 250-299  Per [place] patient needs to follow sliding scale below:  140-199, 2 units 200-250, 4 units 251-299, 6 units 300-349, 8 units > 350, 10 units  Patient is currently hospitalized and Dr.Carter recommended that we not send in any rx's until patient released from hospital unless medications change.  I called [facility] and left message informing them rx will be addressed once patient released from hospital.  Medication list updated

## 2017-02-21 NOTE — Progress Notes (Addendum)
Jejunal cath intact, some leakage note on overlying gauze; celllultis changes at entry site; cont IV antbx; outer disc made taut to skin and new dressing applied to site to hopefully minimize leakage; cont to monitor for now; contact [phone] or [phone](weekends/holidays) with any additional questions and ask to speak with IR radiologist on call.

## 2017-02-21 NOTE — Progress Notes (Signed)
Initial Nutrition Assessment  DOCUMENTATION CODES:   Non-severe (moderate) malnutrition in context of chronic illness  INTERVENTION:   Once TF able to be used, resume home regimen: Osmolite 1.5 @ 65 ml/hr x 12 hours (2100-0900) This will provide 1170 kcal, 48g protein and 594 ml H2O.  Provide Nepro Shake po BID, each supplement provides 425 kcal and 19 grams protein  RD will continue to monitor for plan  NUTRITION DIAGNOSIS:   Moderate Malnutrition related to chronic illness(CKD, COPD, CHF, DM, advanced age) as evidenced by moderate fat depletion, severe muscle depletion.  GOAL:   Patient will meet greater than or equal to 90% of their needs  MONITOR:   PO intake, Supplement acceptance, TF tolerance, Labs, Weight trends, I & O's  REASON FOR ASSESSMENT:   Consult Enteral/tube feeding initiation and management  ASSESSMENT:    83 y.o. female with medical history significant for atrial fibrillation on warfarin, history of CVA, hypothyroidism, hypertension, insulin-dependent diabetes mellitus, chronic diastolic CHF, and PEG tube dependence, now presenting to the emergency department for feeding tube problems.  Patient began to note leaking from about the PEG tube, as well as pain at the site a few weeks ago, and also reports a worsening foul odor over the same interval.  The tube then became inadvertently dislodged last night, prompting her presentation to the ED.  Denies fevers or chills.  Reports thick brownish output from the PEG tube site.  Pt reports that she is waiting for IR to assess her PEG tube. Pt states she was having leaks of her tube. Pt mainly consuming liquids by mouth. Pt requests Nepro shakes to drink. RD will order.  Pt unable to provide details of her tube feeding regimen at home. RD contacted pt's daughter, [name], by phone. Pt's daughter reports pt uses Osmolite 1.5 at night from 9pm to 9am. It is run at 65 ml/hr. Pt's daughter also reports that the patient  thickens thin liquids such as water and juices. Pt mainly consume pudding, yogurt and jello PO. Pt reports she does not drink Nepro thickened.  Per chart review, pt's weight has remained stable.   Medications: B-complex w/ Vitamin C tablet daily, OSCAL-D tablet daily, Ferrous sulfate tablet daily, Free water 60 ml TID, Multivitamin with minerals daily, Protonix tablet daily Labs reviewed: CBGs: 126-173 GFR: 40   NUTRITION - FOCUSED PHYSICAL EXAM:    Most Recent Value  Orbital Region  Moderate depletion  Upper Arm Region  Moderate depletion  Thoracic and Lumbar Region  Unable to assess  Buccal Region  Moderate depletion  Temple Region  Severe depletion  Clavicle Bone Region  Moderate depletion  Clavicle and Acromion Bone Region  Moderate depletion  Scapular Bone Region  Unable to assess  Dorsal Hand  Severe depletion  Patellar Region  Unable to assess  Anterior Thigh Region  Unable to assess  Posterior Calf Region  Unable to assess  Edema (RD Assessment)  None       Diet Order:  Diet full liquid Room service appropriate? Yes; Fluid consistency: Thin  EDUCATION NEEDS:   Education needs have been addressed  Skin:  Skin Assessment: Reviewed RN Assessment  Last BM:  PTA  Height:   Ht Readings from Last 1 Encounters:  02/08/17 5\' 5"  (1.651 m)    Weight:   Wt Readings from Last 1 Encounters:  02/21/17 151 lb 3.8 oz (68.6 kg)    Ideal Body Weight:  56.8 kg  BMI:  Body mass index is 25.17 kg/m.  Estimated Nutritional Needs:   Kcal:  1300-1500  Protein:  60-70g  Fluid:  1.5L/day  Clayton Bibles, MS, RD, LDN [place] [facility] Pager: [phone] After Hours Pager: [phone]

## 2017-02-22 LAB — BASIC METABOLIC PANEL
Anion gap: 5 (ref 5–15)
BUN: 32 mg/dL — ABNORMAL HIGH (ref 6–20)
CO2: 22 mmol/L (ref 22–32)
Calcium: 8.9 mg/dL (ref 8.9–10.3)
Chloride: 114 mmol/L — ABNORMAL HIGH (ref 101–111)
Creatinine, Ser: 1.03 mg/dL — ABNORMAL HIGH (ref 0.44–1.00)
GFR calc Af Amer: 53 mL/min — ABNORMAL LOW (ref >60)
GFR calc non Af Amer: 46 mL/min — ABNORMAL LOW (ref >60)
Glucose, Bld: 146 mg/dL — ABNORMAL HIGH (ref 65–99)
Potassium: 4.2 mmol/L (ref 3.5–5.1)
Sodium: 141 mmol/L (ref 135–145)

## 2017-02-22 LAB — CBC WITH DIFFERENTIAL/PLATELET
Basophils Absolute: 0 10*3/uL (ref 0.0–0.1)
Basophils Relative: 1 %
Eosinophils Absolute: 1 10*3/uL — ABNORMAL HIGH (ref 0.0–0.7)
Eosinophils Relative: 15 %
HCT: 26.9 % — ABNORMAL LOW (ref 36.0–46.0)
Hemoglobin: 8.9 g/dL — ABNORMAL LOW (ref 12.0–15.0)
Lymphocytes Relative: 20 %
Lymphs Abs: 1.2 10*3/uL (ref 0.7–4.0)
MCH: 30.9 pg (ref 26.0–34.0)
MCHC: 33.1 g/dL (ref 30.0–36.0)
MCV: 93.4 fL (ref 78.0–100.0)
Monocytes Absolute: 0.8 10*3/uL (ref 0.1–1.0)
Monocytes Relative: 12 %
Neutro Abs: 3.3 10*3/uL (ref 1.7–7.7)
Neutrophils Relative %: 52 %
Platelets: 238 10*3/uL (ref 150–400)
RBC: 2.88 MIL/uL — ABNORMAL LOW (ref 3.87–5.11)
RDW: 16 % — ABNORMAL HIGH (ref 11.5–15.5)
WBC: 6.3 10*3/uL (ref 4.0–10.5)

## 2017-02-22 LAB — GLUCOSE, CAPILLARY
Glucose-Capillary: 112 mg/dL — ABNORMAL HIGH (ref 65–99)
Glucose-Capillary: 119 mg/dL — ABNORMAL HIGH (ref 65–99)
Glucose-Capillary: 127 mg/dL — ABNORMAL HIGH (ref 65–99)
Glucose-Capillary: 142 mg/dL — ABNORMAL HIGH (ref 65–99)
Glucose-Capillary: 177 mg/dL — ABNORMAL HIGH (ref 65–99)
Glucose-Capillary: 98 mg/dL (ref 65–99)

## 2017-02-22 LAB — MAGNESIUM: Magnesium: 1.9 mg/dL (ref 1.7–2.4)

## 2017-02-22 LAB — PROTIME-INR
INR: 3.48
Prothrombin Time: 34.7 seconds — ABNORMAL HIGH (ref 11.4–15.2)

## 2017-02-22 MED ORDER — LACTATED RINGERS IV SOLN
INTRAVENOUS | Status: AC
  Administered 2017-02-22: 16:00:00 via INTRAVENOUS

## 2017-02-22 MED ORDER — FUROSEMIDE 40 MG PO TABS
40.0000 mg | ORAL_TABLET | Freq: Every day | ORAL | Status: DC
  Administered 2017-02-22 – 2017-02-23 (×2): 40 mg via ORAL
  Filled 2017-02-22 (×2): qty 1

## 2017-02-22 NOTE — Progress Notes (Signed)
PROGRESS NOTE  Shannon [name] XBJ:[id number] DOB: September 13, 1923 DOA: 02/19/2017 PCP: Kirt Boys, DO  HPI/Recap of past 24 hours:  Feeling better, Feeding tube leakage slowed down, gauze placed last night with minimal soilage,  patient feeling feeding tube is less  irritating No n/v, no fever,   Assessment/Plan: Principal Problem:   Complication of feeding tube (HCC) Active Problems:   Diabetes mellitus type 2 in nonobese (HCC)   Persistent atrial fibrillation (HCC)   History of completed stroke   Anemia of chronic disease   Chronic kidney insufficiency, stage 3 (moderate) (HCC)   Accelerated hypertension   Hypothyroidism due to medication   Chronic diastolic CHF (congestive heart failure) (HCC)   COPD with asthma (HCC)   Acute kidney injury superimposed on CKD (HCC)   Abdominal wall cellulitis   1. GJ feeding tube complication with possible abdominal wall cellulitis at tube exit site - Pt presents with several days of worsening pain and leakage around the tube site with foul odor; tube was then inadvertently dislodged night of 11/19  - There erythema and induration surrounding the site with a brownish bloody discharge; non-contrast CT abd/pelvis with large ventral hernia and air along GJ tube tract, but no evidence for obstruction or abscess , s/p replacement by IR on 11/20 - Blood cultures collected in ED no growth and patient was given empiric dose of vancomycin, then meropenem  - Plan to continue supportive care with IVF hydration, prn analgesia, continue abx with meropenem for now  , culture no growth, decreasing drainage around feeding tube insertion site, likely able to d/c home with oral abx  2. Acute kidney injury superimposed on CKD stage III  - SCr is 1.80 on admission, up from 1.0 in August 2018  - Plan to hold Lasix initially, provide gentle IVF hydration, renally-dose medications, avoid nephrotoxins, -cr improving, ivf for another 24hrs  3. Atrial fibrillation    - Rate-controlled on admission  - CHADS-VASc 7 (age x2, gender, CVA x2, DM, HTN) - Continue warfarin and beta-blocker    4. Insulin-dependent DM  - A1c was 7.7% in March 2018  - Managed at home with Lantus 10 units qD and Novolog 2-10 units TID  - Follow CBGs and continue with Novolog correctional only for now    5. COPD  - No wheezing or dyspnea on admission  - Continue Dulera and prn DuoNebs    6. Hypertension - continue Coreg and hydralazine /imdur, -lasix restarted on 11/22  7. Chronic diastolic CHF  - Appears well-compensated, hypovolemic - Hold Lasix held since admission, restart on 11/22.  - Follow daily wts and I/O's, continue Coreg  /hydralazine/imdur  8. Anemia  - Hgb is 9.0 on admission, with recent values ranging from 8-11  - No bleeding evident  - Continue to supplement iron and B-vitamins    9. Ventral hernia  - Noted on prior CT, now larger and containing transverse colon and SB without evidence for obstruction on CT  - Continue supportive care with prn analgesia, monitoring for complication   DVT prophylaxis: warfarin Code Status: DNR Family Communication: patient Disposition Plan: home, likely on 11/23, may need home health,  Consults called: IR   Procedures:  GJ tube exchange on 11/20  Antibiotics:  vanc x1 on 11/20  meropenem from 11/20   Objective: BP (!) 158/66 (BP Location: Right Arm)   Pulse 66   Temp 97.6 F (36.4 C) (Oral)   Resp 18   Wt 68.3 kg (150 lb 9.2 oz)  SpO2 98%   BMI 25.06 kg/m   Intake/Output Summary (Last 24 hours) at 02/22/2017 1310 Last data filed at 02/22/2017 0400 Gross per 24 hour  Intake 480 ml  Output -  Net 480 ml   Filed Weights   02/20/17 0604 02/21/17 0358 02/22/17 0359  Weight: 68.3 kg (150 lb 9.2 oz) 68.6 kg (151 lb 3.8 oz) 68.3 kg (150 lb 9.2 oz)    Exam: Patient is examined daily including today on 02/22/2017, exams remain the same as of yesterday except that has changed     General:  NAD  Cardiovascular: RRR  Respiratory: CTABL  Abdomen: Soft/ND/NT, positive BS  Musculoskeletal: No Edema  Neuro: alert, oriented   Data Reviewed: Basic Metabolic Panel: Recent Labs  Lab 02/20/17 0134 02/21/17 0440 02/22/17 0436  NA 143 144 141  K 4.1 4.4 4.2  CL 105 114* 114*  CO2  --  26 22  GLUCOSE 123* 138* 146*  BUN 78* 53* 32*  CREATININE 1.80* 1.30* 1.03*  CALCIUM  --  9.0 8.9  MG  --   --  1.9   Liver Function Tests: No results for input(s): AST, ALT, ALKPHOS, BILITOT, PROT, ALBUMIN in the last 168 hours. No results for input(s): LIPASE, AMYLASE in the last 168 hours. No results for input(s): AMMONIA in the last 168 hours. CBC: Recent Labs  Lab 02/20/17 0122 02/20/17 0134 02/21/17 0440 02/22/17 0436  WBC 9.4  --  6.4 6.3  NEUTROABS 5.6  --  3.3 3.3  HGB 9.0* 9.2* 8.8* 8.9*  HCT 27.1* 27.0* 27.0* 26.9*  MCV 92.8  --  94.1 93.4  PLT 228  --  233 238   Cardiac Enzymes:   No results for input(s): CKTOTAL, CKMB, CKMBINDEX, TROPONINI in the last 168 hours. BNP (last 3 results) Recent Labs    06/08/16 1523 08/12/16 1400 09/14/16 2140  BNP 1,268.6* 92.2 226.1*    ProBNP (last 3 results) Recent Labs    05/08/16 1053  PROBNP 1,132*    CBG: Recent Labs  Lab 02/21/17 2031 02/22/17 0042 02/22/17 0357 02/22/17 0755 02/22/17 1152  GLUCAP 96 127* 142* 112* 177*    Recent Results (from the past 240 hour(s))  Blood culture (routine x 2)     Status: None (Preliminary result)   Collection Time: 02/20/17  4:35 AM  Result Value Ref Range Status   Specimen Description BLOOD LEFT HAND  Final   Special Requests   Final    BOTTLES DRAWN AEROBIC AND ANAEROBIC Blood Culture adequate volume   Culture   Final    NO GROWTH 1 DAY Performed at [facility], [address] [place], [place] [address]    Report Status PENDING  Incomplete  Blood culture (routine x 2)     Status: None (Preliminary result)   Collection Time: 02/20/17   4:40 AM  Result Value Ref Range Status   Specimen Description BLOOD RIGHT HAND  Final   Special Requests   Final    BOTTLES DRAWN AEROBIC AND ANAEROBIC Blood Culture adequate volume   Culture   Final    NO GROWTH 1 DAY Performed at [facility], [address] [place], [place] [address]    Report Status PENDING  Incomplete  MRSA PCR Screening     Status: None   Collection Time: 02/20/17  6:00 AM  Result Value Ref Range Status   MRSA by PCR NEGATIVE NEGATIVE Final    Comment:  The GeneXpert MRSA Assay (FDA approved for NASAL specimens only), is one component of a comprehensive MRSA colonization surveillance program. It is not intended to diagnose MRSA infection nor to guide or monitor treatment for MRSA infections.      Studies: No results found.  Scheduled Meds: . acetaminophen (TYLENOL) oral liquid 160 mg/5 mL  325 mg Oral TID WC  . artificial tears  1 application Both Eyes QHS  . atorvastatin  10 mg Oral q1800  . B-complex with vitamin C  1 tablet Oral Daily  . calcium-vitamin D  1 tablet Oral Q breakfast  . carvedilol  12.5 mg Oral q1800  . carvedilol  25 mg Oral Q breakfast  . cycloSPORINE  1 drop Both Eyes BID  . feeding supplement (NEPRO CARB STEADY)  237 mL Oral BID BM  . feeding supplement (OSMOLITE 1.5 CAL)  1,000 mL Per Tube Q24H  . ferrous sulfate  325 mg Oral Q breakfast  . fluticasone  2 spray Each Nare Daily  . free water  60 mL Per Tube TID  . gabapentin  600 mg Oral TID  . hydrALAZINE  25 mg Oral 2 times per day  . hydrALAZINE  50 mg Oral QAC breakfast  . insulin aspart  0-15 Units Subcutaneous Q4H  . isosorbide mononitrate  30 mg Oral Daily  . latanoprost  1 drop Both Eyes QHS  . levothyroxine  137 mcg Oral QAC breakfast  . mometasone-formoterol  2 puff Inhalation BID  . multivitamin with minerals  1 tablet Oral Daily  . pantoprazole  40 mg Oral BID  . polyvinyl alcohol  1 drop Both Eyes QID  . sertraline  100 mg Oral Daily  .  tiotropium  18 mcg Inhalation Daily  . Warfarin - Pharmacist Dosing Inpatient   Does not apply q1800    Continuous Infusions: . dextrose 5 % and 0.9% NaCl 75 mL/hr at 02/22/17 0054  . meropenem (MERREM) IV Stopped (02/22/17 0622)     Time spent: 25 mins I have personally reviewed and interpreted on  02/22/2017 daily labs,  imagings as discussed above under date review session and assessment and plans.  I reviewed all nursing notes, pharmacy notes, consultant notes,  vitals, pertinent old records  I have discussed plan of care as described above with RN , patient and family on 02/22/2017   Albertine Grates MD, PhD  [facility] Pager [phone]. If 7PM-7AM, please contact night-coverage at [email or web address], password [facility] 02/22/2017, 1:10 PM  LOS: 2 days

## 2017-02-22 NOTE — Progress Notes (Signed)
Started Osmolite last night. Tube clogged during feeding but flushes fine. Turned off feeding as tube continued to clog.

## 2017-02-22 NOTE — Progress Notes (Signed)
ANTICOAGULATION CONSULT NOTE - Follow Up Consult  Pharmacy Consult for warfarin Indication: atrial fibrillation and stroke    Allergies  Allergen Reactions  . Penicillins Itching, Rash and Other (See Comments)    Tolerated unasyn, zosyn & cephalosporins in the past.  Has patient had a PCN reaction causing immediate rash, facial/tongue/throat swelling, SOB or lightheadedness with hypotension: Yes Has patient had a PCN reaction causing severe rash involving mucus membranes or skin necrosis: Unk Has patient had a PCN reaction that required hospitalization: Unk Has patient had a PCN reaction occurring within the last 10 years: Unk If all of the above answers are "NO", then may proceed with Cephalosporins  . Other     Patient tolerates amoxicillin and cephalosporins    Patient Measurements: Weight: 150 lb 9.2 oz (68.3 kg)   Vital Signs: Temp: 97.6 F (36.4 C) (11/22 0359) Temp Source: Oral (11/22 0359) BP: 158/66 (11/22 0359) Pulse Rate: 66 (11/22 0359)  Labs: Recent Labs    02/20/17 0122 02/20/17 0134 02/21/17 0440 02/22/17 0436  HGB 9.0* 9.2* 8.8* 8.9*  HCT 27.1* 27.0* 27.0* 26.9*  PLT 228  --  233 238  LABPROT 30.0*  --  32.5* 34.7*  INR 2.88  --  3.19 3.48  CREATININE  --  1.80* 1.30* 1.03*    Estimated Creatinine Clearance: 32 mL/min (A) (by C-G formula based on SCr of 1.03 mg/dL (H)).    Assessment: Pt on warfarin for hx of afib and CVA.  Warfarin PTA per warfarin clinic 7.5mg  every Mon, Wed, Fri: 5mg  all other days.  INR on admit 2.88  02/22/2017 INR 3.48 supratherapeutic Pt w/o nutrition for a couple of days due to issues with tube H/H low but stable Plts WNL  Goal of Therapy:  INR 2-3   Plan:  Hold warfarin today Daily INR  Dolly Rias RPh 02/22/2017, 8:03 AM Pager [phone]

## 2017-02-23 LAB — PHOSPHORUS: Phosphorus: 2.1 mg/dL — ABNORMAL LOW (ref 2.5–4.6)

## 2017-02-23 LAB — GLUCOSE, CAPILLARY
Glucose-Capillary: 120 mg/dL — ABNORMAL HIGH (ref 65–99)
Glucose-Capillary: 142 mg/dL — ABNORMAL HIGH (ref 65–99)
Glucose-Capillary: 150 mg/dL — ABNORMAL HIGH (ref 65–99)
Glucose-Capillary: 151 mg/dL — ABNORMAL HIGH (ref 65–99)
Glucose-Capillary: 153 mg/dL — ABNORMAL HIGH (ref 65–99)

## 2017-02-23 LAB — CBC WITH DIFFERENTIAL/PLATELET
Basophils Absolute: 0 10*3/uL (ref 0.0–0.1)
Basophils Relative: 0 %
Eosinophils Absolute: 1 10*3/uL — ABNORMAL HIGH (ref 0.0–0.7)
Eosinophils Relative: 13 %
HCT: 27.8 % — ABNORMAL LOW (ref 36.0–46.0)
Hemoglobin: 9.3 g/dL — ABNORMAL LOW (ref 12.0–15.0)
Lymphocytes Relative: 17 %
Lymphs Abs: 1.3 10*3/uL (ref 0.7–4.0)
MCH: 30.7 pg (ref 26.0–34.0)
MCHC: 33.5 g/dL (ref 30.0–36.0)
MCV: 91.7 fL (ref 78.0–100.0)
Monocytes Absolute: 0.7 10*3/uL (ref 0.1–1.0)
Monocytes Relative: 9 %
Neutro Abs: 4.4 10*3/uL (ref 1.7–7.7)
Neutrophils Relative %: 61 %
Platelets: 250 10*3/uL (ref 150–400)
RBC: 3.03 MIL/uL — ABNORMAL LOW (ref 3.87–5.11)
RDW: 15.6 % — ABNORMAL HIGH (ref 11.5–15.5)
WBC: 7.4 10*3/uL (ref 4.0–10.5)

## 2017-02-23 LAB — BASIC METABOLIC PANEL
Anion gap: 4 — ABNORMAL LOW (ref 5–15)
BUN: 24 mg/dL — ABNORMAL HIGH (ref 6–20)
CO2: 22 mmol/L (ref 22–32)
Calcium: 9 mg/dL (ref 8.9–10.3)
Chloride: 111 mmol/L (ref 101–111)
Creatinine, Ser: 0.99 mg/dL (ref 0.44–1.00)
GFR calc Af Amer: 56 mL/min — ABNORMAL LOW (ref >60)
GFR calc non Af Amer: 48 mL/min — ABNORMAL LOW (ref >60)
Glucose, Bld: 142 mg/dL — ABNORMAL HIGH (ref 65–99)
Potassium: 4.1 mmol/L (ref 3.5–5.1)
Sodium: 137 mmol/L (ref 135–145)

## 2017-02-23 LAB — PROTIME-INR
INR: 2.69
Prothrombin Time: 28.4 seconds — ABNORMAL HIGH (ref 11.4–15.2)

## 2017-02-23 LAB — MAGNESIUM: Magnesium: 1.6 mg/dL — ABNORMAL LOW (ref 1.7–2.4)

## 2017-02-23 MED ORDER — METRONIDAZOLE 500 MG PO TABS: 500.0000 mg | ORAL_TABLET | Freq: Three times a day (TID) | ORAL | 0 refills | Status: AC

## 2017-02-23 MED ORDER — WARFARIN SODIUM 2.5 MG PO TABS: 2.5000 mg | ORAL_TABLET | Freq: Once | ORAL | Status: DC

## 2017-02-23 MED ORDER — ISOSORBIDE MONONITRATE ER 30 MG PO TB24: 60.0000 mg | ORAL_TABLET | Freq: Every day | ORAL | 0 refills | Status: DC

## 2017-02-23 MED ORDER — CIPROFLOXACIN HCL 500 MG PO TABS: 500.0000 mg | ORAL_TABLET | Freq: Two times a day (BID) | ORAL | 0 refills | Status: AC

## 2017-02-23 MED ORDER — MAGNESIUM SULFATE 2 GM/50ML IV SOLN
2.0000 g | Freq: Once | INTRAVENOUS | Status: AC
  Administered 2017-02-23: 2 g via INTRAVENOUS
  Filled 2017-02-23: qty 50

## 2017-02-23 MED ORDER — ENALAPRILAT 1.25 MG/ML IV SOLN
0.6250 mg | Freq: Once | INTRAVENOUS | Status: AC
  Administered 2017-02-23: 0.625 mg via INTRAVENOUS
  Filled 2017-02-23: qty 0.5

## 2017-02-23 NOTE — Discharge Summary (Signed)
Discharge Summary  Shannon [name] GEX:[id number] DOB: 1924/02/26  PCP: Kirt Boys, DO  Admit date: 02/19/2017 Discharge date: 02/23/2017  Time spent: >77mins, more than 50% time spent on coordination of care  Recommendations for Outpatient Follow-up:  1. F/u with PMD within a week  for hospital discharge follow up, repeat cbc/bmp at follow up 2. _INR check on monday 3. F/u with interventional radiology for GJ tube management  Discharge Diagnoses:  Active Hospital Problems   Diagnosis Date Noted  . Complication of feeding tube (HCC) 02/20/2017  . Acute kidney injury superimposed on CKD (HCC) 02/20/2017  . Abdominal wall cellulitis 02/20/2017  . COPD with asthma (HCC) 10/03/2016  . Chronic diastolic CHF (congestive heart failure) (HCC) 01/24/2016  . Hypothyroidism due to medication   . Anemia of chronic disease 01/18/2014  . Chronic kidney insufficiency, stage 3 (moderate) (HCC) 01/18/2014  . Diabetes mellitus type 2 in nonobese (HCC) 09/22/2013  . Persistent atrial fibrillation (HCC) 09/22/2013  . History of completed stroke 09/22/2013  . Accelerated hypertension 09/07/2013    Resolved Hospital Problems  No resolved problems to display.    Discharge Condition: stable  Diet recommendation: clears and meds by mouth as tolerated, aspiration precaution Continue nightly tube feeds   Filed Weights   02/21/17 0358 02/22/17 0359 02/23/17 0423  Weight: 68.6 kg (151 lb 3.8 oz) 68.3 kg (150 lb 9.2 oz) 72 kg (158 lb 11.7 oz)    History of present illness:  PCP: Kirt Boys, DO   Patient coming from: Home  Chief Complaint: Feeding tube problems   HPI: Shannon [name] is a 83 y.o. female with medical history significant for atrial fibrillation on warfarin, history of CVA, hypothyroidism, hypertension, insulin-dependent diabetes mellitus, chronic diastolic CHF, and PEG tube dependence, now presenting to the emergency department for feeding tube problems.  Patient began to note  leaking from about the PEG tube, as well as pain at the site a few weeks ago, and also reports a worsening foul odor over the same interval.  The tube then became inadvertently dislodged last night, prompting her presentation to the ED.  Denies fevers or chills.  Reports thick brownish output from the PEG tube site.  ED Course: Upon arrival to the ED, patient is found to be afebrile, saturating well on room air, and with vitals otherwise stable.  Chemistry panel is notable for a BUN of 78 and creatinine 1.80, up from 1.01 in August of this year.  CBC is notable for a chronic normocytic anemia with hemoglobin of 9.0, and INR is therapeutic at 2.88.  Noncontrast CT of the abdomen and pelvis features low-attenuation lesions throughout the liver that are indeterminant and could be further evaluated with MRI as appropriate.  Also noted on the CT is large ventral hernia containing transverse colon and small bowel without evidence for obstruction, and air along the G-tube tract.  ED provider saw feculent discharge from the tube site, had blood cultures collected, gave a 500 cc normal saline bolus, and treated the patient with empiric vancomycin.  She was also treated with fentanyl.  She has remained hemodynamically stable, in no apparent respiratory distress, and will be admitted to the medical/surgical unit for ongoing evaluation and management feeding tube complications.     Hospital Course:  Principal Problem:   Complication of feeding tube [facility]) Active Problems:   Diabetes mellitus type 2 in nonobese (HCC)   Persistent atrial fibrillation (HCC)   History of completed stroke   Anemia of chronic disease  Chronic kidney insufficiency, stage 3 (moderate) (HCC)   Accelerated hypertension   Hypothyroidism due to medication   Chronic diastolic CHF (congestive heart failure) (HCC)   COPD with asthma (HCC)   Acute kidney injury superimposed on CKD (HCC)   Abdominal wall cellulitis   1.GJ feeding tube  complicationwith possible abdominal wall cellulitis at tube exit site -Pt presents with several days of worsening pain and leakage around the tube site with foul odor; tube was theninadvertentlydislodged night of 11/19 -There erythema and induration surrounding the site with a brownish bloody discharge; non-contrast CT abd/pelvis with large ventral hernia and air along GJ tube tract, but no evidence for obstruction or abscess, s/p replacement by IR on 11/20 -Blood cultures collected in ED no growth and patient was given empiric dose of vancomycin, then meropenem - culture no growth, decreasing drainage around feeding tube insertion site, tolerate nightly tube feeds, no fever, no leukocytosis, no pain,  d/c home with oral abx with cipro and flagyl for three days.  2.Acute kidney injury superimposed on CKD stage III -bun 78 on admission, SCr is 1.80 on admission, up from 1.0 in August 2018 - Lasix held initially, provide gentle IVF hydration, renally-dose medications, avoid nephrotoxins, -cr improving, cr back to baseline, cr 0.99 at discharge.  Bun down to 24 at discharge. -home meds lasix resumed at discharge ( she report lower extremity edema at home, no edema on exam in the hospital)  3.Paoxysmal Atrial fibrillation -Rate-controlled on admission -CHADS-VASc 7 (age x2, gender, CVA x2, DM, HTN) -Continue warfarin and beta-blocker  4.Insulin-dependent DM -A1c was 7.7% in March 2018 -Managed at home with Lantus 10 units qD and Novolog 2-10 units TID -Follow CBGs and continue with Novolog correctional only for now  5.COPD -No wheezing or dyspnea on admission -Continue Dulera and prn DuoNebs  6.Hypertension -continue Coreg and hydralazine /imdur, -lasix restarted on 11/22 -bp remain elevated, imdur dose increased at discharge  7.Chronic diastolic CHF -Appears well-compensated, hypovolemic on presentation -Hold Lasix held since admission,  restart on 11/22.  - continue Coreg/hydralazine/imdur -euvolemic at discharge, no edema, no hypoxia  8.Anemia -Hgb is 9.0 on admission, with recent values ranging from 8-11 -No bleeding evident -Continue to supplement iron and B-vitamins   9. Ventral hernia  - Noted on prior CT, now larger and containing transverse colon and SB without evidence for obstruction on CT  - Continue supportive care with prn analgesia, monitoring for complication -no abdominal pain, no n/v, +bm  10: pressure ulcer stage II, presented on admission Pressure off loading, skin care   DVT prophylaxis:warfarin Code Status:DNR Family Communication:patient Disposition Plan:home on 11/23, family declined home health,  Consults called:IR   Procedures:  GJ tube exchange on 11/20  Antibiotics:  vanc x1 on 11/20  meropenem from 11/20   Discharge Exam: BP (!) 166/70   Pulse 69   Temp 98.2 F (36.8 C) (Oral)   Resp 16   Wt 72 kg (158 lb 11.7 oz)   SpO2 97%   BMI 26.41 kg/m   General: NAD, pleasant Cardiovascular: RRR Respiratory: CTABL Abdomen: ventral hernia, + feeding tube, old healed surgical scar, + bs, nontender Extremities: s/p toe amputation left foot, chronic venous stasis skin changes, L>R, no edema  Discharge Instructions You were cared for by a hospitalist during your hospital stay. If you have any questions about your discharge medications or the care you received while you were in the hospital after you are discharged, you can call the unit and asked to  speak with the hospitalist on call if the hospitalist that took care of you is not available. Once you are discharged, your primary care physician will handle any further medical issues. Please note that NO REFILLS for any discharge medications will be authorized once you are discharged, as it is imperative that you return to your primary care physician (or establish a relationship with a primary care physician if  you do not have one) for your aftercare needs so that they can reassess your need for medications and monitor your lab values.  Discharge Instructions    Diet general   Complete by:  As directed    Clear liquid and meds  By mouth as tolerated, aspiration precaution. Tube feeds nightly   Increase activity slowly   Complete by:  As directed      Allergies as of 02/23/2017      Reactions   Penicillins Itching, Rash, Other (See Comments)   Tolerated unasyn, zosyn & cephalosporins in the past.  Has patient had a PCN reaction causing immediate rash, facial/tongue/throat swelling, SOB or lightheadedness with hypotension: Yes Has patient had a PCN reaction causing severe rash involving mucus membranes or skin necrosis: Unk Has patient had a PCN reaction that required hospitalization: Unk Has patient had a PCN reaction occurring within the last 10 years: Unk If all of the above answers are "NO", then may proceed with Cephalosporins   Other    Patient tolerates amoxicillin and cephalosporins      Medication List    TAKE these medications   acetaminophen 500 MG tablet Commonly known as:  TYLENOL Take 1,000 mg by mouth every 6 (six) hours as needed (pain).   AMINO ACIDS-PROTEIN HYDROLYS PO Take 30 mLs by mouth 2 (two) times daily.   atorvastatin 10 MG tablet Commonly known as:  LIPITOR TAKE ONE TABLET BY MOUTH ONCE DAILY   b complex vitamins tablet Take 1 tablet by mouth daily.   calcium-vitamin D 500-200 MG-UNIT tablet Commonly known as:  OSCAL WITH D Take 1 tablet by mouth daily with breakfast.   carvedilol 6.25 MG tablet Commonly known as:  COREG Take 2 tabs po in AM and 1 tab po in PM for blood pressure   ciprofloxacin 500 MG tablet Commonly known as:  CIPRO Take 1 tablet (500 mg total) by mouth 2 (two) times daily for 3 days.   cycloSPORINE 0.05 % ophthalmic emulsion Commonly known as:  RESTASIS USE 1 DROP INTO BOTH EYES TWICE DAILY What changed:    how much to  take  how to take this  when to take this  additional instructions   feeding supplement Liqd Take 711 mLs by mouth at bedtime.   ferrous sulfate 325 (65 FE) MG tablet TAKE 1 TABLET BY MOUTH EVERY DAY WITH BREAKFAST   fluticasone 50 MCG/ACT nasal spray Commonly known as:  FLONASE Place 2 sprays into both nostrils daily.   free water Soln Place 60 mLs into feeding tube 3 (three) times daily.   furosemide 40 MG tablet Commonly known as:  LASIX Take 1 tablet (40 mg total) by mouth daily.   gabapentin 300 MG capsule Commonly known as:  NEURONTIN TAKE 2 CAPSULES (600 MG TOTAL) BY MOUTH 3 (THREE) TIMES DAILY.   hydrALAZINE 25 MG tablet Commonly known as:  APRESOLINE Take 2 tablets in the mornings, 1 tablet in the afternoons, and 1 tablet in the evenings   Insulin Glargine 100 UNIT/ML Solostar Pen Commonly known as:  LANTUS Injection 0.1 ml (  10 units) once daily to control blood sugar. Dx E11.9   Insulin Pen Needle 31G X 5 MM Misc Commonly known as:  B-D UF III MINI PEN NEEDLES Use as Directed in giving insulin. Dx E11.9   ipratropium-albuterol 0.5-2.5 (3) MG/3ML Soln Commonly known as:  DUONEB Take 3 mLs by nebulization every 6 (six) hours as needed (sob). Use 3 times daily x 4 days then every 6 hours as needed. What changed:  additional instructions   isosorbide mononitrate 30 MG 24 hr tablet Commonly known as:  IMDUR Take 2 tablets (60 mg total) by mouth daily. What changed:  how much to take   latanoprost 0.005 % ophthalmic solution Commonly known as:  XALATAN Place 1 drop into both eyes at bedtime.   levalbuterol 45 MCG/ACT inhaler Commonly known as:  XOPENEX HFA Inhale 2 puffs into the lungs every 6 (six) hours as needed for wheezing or shortness of breath.   levothyroxine 137 MCG tablet Commonly known as:  SYNTHROID, LEVOTHROID TAKE 1 TABLET (137 MCG TOTAL) BY MOUTH DAILY BEFORE BREAKFAST.   metroNIDAZOLE 500 MG tablet Commonly known as:  FLAGYL Take 1  tablet (500 mg total) by mouth 3 (three) times daily for 3 days.   mometasone-formoterol 200-5 MCG/ACT Aero Commonly known as:  DULERA Inhale 2 puffs into the lungs 2 (two) times daily.   multivitamin with minerals Tabs tablet Take 1 tablet by mouth daily.   NOVOLOG FLEXPEN 100 UNIT/ML FlexPen Generic drug:  insulin aspart Take three times daily before each meal based on sliding scale 140-199( 2 units), 200-250 (4 units), 251-299 (6 units), 300-349 (8 units), > 350(10 units)   nystatin 100000 UNIT/ML suspension Commonly known as:  MYCOSTATIN 5 ml swish and spit 4 times daily for 1 week then as needed until resolves   nystatin powder Commonly known as:  MYCOSTATIN/NYSTOP Apply topically 3 (three) times daily.   oxyCODONE 5 MG immediate release tablet Commonly known as:  ROXICODONE Take 1 tablet (5 mg total) by mouth every 4 (four) hours as needed for severe pain. May take 1/2 tab q4hrs as needed for mild pain.   OXYGEN Inhale 2 L into the lungs at bedtime.   pantoprazole 40 MG tablet Commonly known as:  PROTONIX Take 1 tablet (40 mg total) 2 (two) times daily by mouth.   Polyethyl Glycol-Propyl Glycol 0.4-0.3 % Soln Place 1 drop into both eyes 4 (four) times daily.   RESOURCE THICKENUP CLEAR Powd Take 120 g by mouth as needed (use to thicken liquids.).   sertraline 100 MG tablet Commonly known as:  ZOLOFT TAKE ONE TABLET BY MOUTH ONCE DAILY FOR DEPRESSION   SYSTANE NIGHTTIME Oint Apply 1 application to eye at bedtime. Both eyes   warfarin 2.5 MG tablet Commonly known as:  COUMADIN Take as directed. If you are unsure how to take this medication, talk to your nurse or doctor. Original instructions:  TAKE AS DIRECTED BY [facility]MADIN CLINIC. What changed:  See the new instructions.      Allergies  Allergen Reactions  . Penicillins Itching, Rash and Other (See Comments)    Tolerated unasyn, zosyn & cephalosporins in the past.  Has patient had a PCN reaction causing  immediate rash, facial/tongue/throat swelling, SOB or lightheadedness with hypotension: Yes Has patient had a PCN reaction causing severe rash involving mucus membranes or skin necrosis: Unk Has patient had a PCN reaction that required hospitalization: Unk Has patient had a PCN reaction occurring within the last 10 years: Unk If  all of the above answers are "NO", then may proceed with Cephalosporins  . Other     Patient tolerates amoxicillin and cephalosporins   Follow-up Information    Kirt Boys, DO Follow up in 1 week(s).   Specialty:  Internal Medicine Why:  hospital discharge follow up. Contact information: [address] [place] [address] [phone]        have INR checked on Monday. Follow up.            The results of significant diagnostics from this hospitalization (including imaging, microbiology, ancillary and laboratory) are listed below for reference.    Significant Diagnostic Studies: Ct Abdomen Pelvis Wo Contrast  Result Date: 02/20/2017 CLINICAL DATA:  Leaking gastrostomy tube with foul odor for a few weeks. Tube came out today. Solid stool coming from the site. History of diabetes and hypertension. EXAM: CT ABDOMEN AND PELVIS WITHOUT CONTRAST TECHNIQUE: Multidetector CT imaging of the abdomen and pelvis was performed following the standard protocol without IV contrast. COMPARISON:  Abdominal radiographs 01/05/2017 FINDINGS: Lower chest: Cardiac enlargement. Aortic and coronary artery calcifications. Atelectasis or consolidation in the left lung base. This could represent pneumonia in the appropriate clinical setting. Hepatobiliary: Surgical absence of the gallbladder. No bile duct dilatation. Multiple low-attenuation lesions scattered throughout the liver. Largest is in segment 6 and measures about 8 mm in diameter. Liver lesions are indeterminate on noncontrast imaging. If there is history of malignancy, consider MRI for further characterization.  Pancreas: Unremarkable. No pancreatic ductal dilatation or surrounding inflammatory changes. Spleen: Normal in size without focal abnormality. Adrenals/Urinary Tract: Adrenal glands are unremarkable. Kidneys are normal, without renal calculi, focal lesion, or hydronephrosis. Bladder is unremarkable. Stomach/Bowel: Diffusely stool-filled colon. Colonic diverticulosis without evidence of diverticulitis. Multiple small bowel diverticula are also present. Stomach, small bowel, and colon are not abnormally distended. Appendix is not identified. Large ventral abdominal wall hernia containing transverse colon and small bowel without evidence of proximal obstruction. Surgical clips along the greater curvature of the stomach. Vascular/Lymphatic: Aortic atherosclerosis. No enlarged abdominal or pelvic lymph nodes. Reproductive: Uterus and bilateral adnexa are unremarkable. Other: Anterior abdominal wall hernias containing colon, small bowel, and fat. Infiltration and gas along the left upper quadrant anterior abdominal wall extending to the greater curvature of the stomach likely representing the tract from previous gastrostomy tube. No discrete fluid collection to suggest an abscess. No free air or free fluid in the abdomen. Musculoskeletal: Degenerative changes in the spine. Old anterior compression of T11. No destructive bone lesions. IMPRESSION: 1. Possible pneumonia versus atelectasis in the left lung base. 2. Multiple low-attenuation lesions throughout the liver are indeterminate on noncontrast imaging. If there is history of malignancy, consider MRI for further characterization. 3. Diverticulosis of the colon and small bowel. No inflammatory changes identified. 4. Large ventral abdominal wall hernia containing transverse colon and small bowel without evidence of proximal obstruction. 5. Infiltration and gas along the left upper quadrant anterior abdominal wall representing the previous gastrostomy tube tract. No  definite abscess. 6. Aortic atherosclerosis. Electronically Signed   By: Burman Nieves M.D.   On: 02/20/2017 02:54   US Renal  Result Date: 02/20/2017 CLINICAL DATA:  Acute renal failure. EXAM: RENAL / URINARY TRACT ULTRASOUND COMPLETE COMPARISON:  CT 02/20/2017 . FINDINGS: Right Kidney: Length: 8.8 cm. Echogenicity within normal limits. Lobular contour. No mass or hydronephrosis visualized. Left Kidney: Length: 8.4 cm. Echogenicity within normal limits. Lobular contour. No mass or hydronephrosis visualized. Bladder: Appears normal for degree of bladder distention.  IMPRESSION: 1. Bilateral small kidneys suggesting some degree of atrophy. Bilateral renal scarring. 2. No acute abnormality.  No hydronephrosis or bladder distention . Electronically Signed   By: Maisie Fus  Register   On: 02/20/2017 14:31   Ir Gj Tube Change  Result Date: 02/20/2017 INDICATION: 83 year old female with a history of a percutaneous gastro jejunostomy tube which has recently become displaced. Additionally, she has cellulitis of the soft tissue tract. EXAM: JEJUNAL CATHETER REPLACEMENT MEDICATIONS: None. ANESTHESIA/SEDATION: None. CONTRAST:  15mL ISOVUE-300 IOPAMIDOL (ISOVUE-300) INJECTION 61%, 15mL ISOVUE-300 IOPAMIDOL (ISOVUE-300) INJECTION 61% - administered into the gastric lumen. FLUOROSCOPY TIME:  Fluoroscopy Time: 3 minutes 54 seconds (35 mGy). COMPLICATIONS: None immediate. PROCEDURE: Informed written consent was obtained from the patient after a thorough discussion of the procedural risks, benefits and alternatives. All questions were addressed. Maximal Sterile Barrier Technique was utilized including caps, mask, sterile gowns, sterile gloves, sterile drape, hand hygiene and skin antiseptic. A timeout was performed prior to the initiation of the procedure. An angled catheter and hydrophilic wire were navigated through the soft tissue tract and into the stomach. Contrast material was injected demonstrating expected  postsurgical changes of the stomach. There is no evidence of fistulization with adjacent colon or small bowel. The catheter was successfully navigated into the duodenum, through the duodenum and into the proximal jejunum. The hydrophilic wire was removed and replaced with a stiff Amplatz wire. A 5 French catheter was removed. A new 22 French gastro jejunostomy tube was then advanced over the wire and positioned with the distal tip in the proximal jejunum. This was consist firm to by contrast injection under fluoroscopy. An image was saved. The catheter was then flushed with saline. The retention balloon was inflated with 9 mL water and pulled tight against the anterior abdominal wall. IMPRESSION: Successful replacement of a new 22 French gastrojejunostomy tube. The J arm tip terminates in the proximal small bowel and is ready for immediate use. Signed, Sterling Big, MD [facility] Electronically Signed   By: Malachy Moan M.D.   On: 02/20/2017 14:17   Ir Patient Eval Tech 0-60 Mins  Result Date: 01/29/2017 Daryel November     01/29/2017  3:21 PM Patient came in today with complaint that feeding pump would not stay in the feeding tube.  Additionally, she has persistent pain around and below the j tube site.  I evaluated the tube and saw no signs of the tube wearing out or problems with the hub.  I advised the patient and her daughter that I could add a lopez valve that would help keep the feeding pump in place.  The patient was not happy with the additional weight of the valve but understood that was the only way to prevent the pump from disconnecting during the night.  She also asked if I could reapply the silver nitrate to the insertion site as previously done.  [facility] evaluated the site and I proceeded to apply the silver nitrate as requested.  The patient has an appointment to follow up with her primary care physician tomorrow  regarding the  continual pain that is not associated with the insertion site or feeding tube.  She will call us if she has any further concerns   Microbiology: Recent Results (from the past 240 hour(s))  Blood culture (routine x 2)     Status: None (Preliminary result)   Collection Time: 02/20/17  4:35 AM  Result Value Ref Range Status  Specimen Description BLOOD LEFT HAND  Final   Special Requests   Final    BOTTLES DRAWN AEROBIC AND ANAEROBIC Blood Culture adequate volume   Culture   Final    NO GROWTH 2 DAYS Performed at [facility], [address], [place], [place] [address]    Report Status PENDING  Incomplete  Blood culture (routine x 2)     Status: None (Preliminary result)   Collection Time: 02/20/17  4:40 AM  Result Value Ref Range Status   Specimen Description BLOOD RIGHT HAND  Final   Special Requests   Final    BOTTLES DRAWN AEROBIC AND ANAEROBIC Blood Culture adequate volume   Culture   Final    NO GROWTH 2 DAYS Performed at [facility], [address], [place], [place] [address]    Report Status PENDING  Incomplete  MRSA PCR Screening     Status: None   Collection Time: 02/20/17  6:00 AM  Result Value Ref Range Status   MRSA by PCR NEGATIVE NEGATIVE Final    Comment:        The GeneXpert MRSA Assay (FDA approved for NASAL specimens only), is one component of a comprehensive MRSA colonization surveillance program. It is not intended to diagnose MRSA infection nor to guide or monitor treatment for MRSA infections.      Labs: Basic Metabolic Panel: Recent Labs  Lab 02/20/17 0134 02/21/17 0440 02/22/17 0436 02/23/17 0330  NA 143 144 141 137  K 4.1 4.4 4.2 4.1  CL 105 114* 114* 111  CO2  --  26 22 22   GLUCOSE 123* 138* 146* 142*  BUN 78* 53* 32* 24*  CREATININE 1.80* 1.30* 1.03* 0.99  CALCIUM  --  9.0 8.9 9.0  MG  --   --  1.9 1.6*  PHOS  --   --   --  2.1*   Liver Function Tests: No results for input(s): AST, ALT, ALKPHOS,  BILITOT, PROT, ALBUMIN in the last 168 hours. No results for input(s): LIPASE, AMYLASE in the last 168 hours. No results for input(s): AMMONIA in the last 168 hours. CBC: Recent Labs  Lab 02/20/17 0122 02/20/17 0134 02/21/17 0440 02/22/17 0436 02/23/17 0330  WBC 9.4  --  6.4 6.3 7.4  NEUTROABS 5.6  --  3.3 3.3 4.4  HGB 9.0* 9.2* 8.8* 8.9* 9.3*  HCT 27.1* 27.0* 27.0* 26.9* 27.8*  MCV 92.8  --  94.1 93.4 91.7  PLT 228  --  233 238 250   Cardiac Enzymes: No results for input(s): CKTOTAL, CKMB, CKMBINDEX, TROPONINI in the last 168 hours. BNP: BNP (last 3 results) Recent Labs    06/08/16 1523 08/12/16 1400 09/14/16 2140  BNP 1,268.6* 92.2 226.1*    ProBNP (last 3 results) Recent Labs    05/08/16 1053  PROBNP 1,132*    CBG: Recent Labs  Lab 02/22/17 2054 02/23/17 0024 02/23/17 0421 02/23/17 0800 02/23/17 1201  GLUCAP 119* 142* 151* 153* 120*       Signed:  Albertine Grates MD, PhD  [facility] 02/23/2017, 12:10 PM

## 2017-02-23 NOTE — Progress Notes (Signed)
ANTICOAGULATION CONSULT NOTE - Follow Up Consult  Pharmacy Consult for warfarin Indication: atrial fibrillation and stroke   Allergies  Allergen Reactions  . Penicillins Itching, Rash and Other (See Comments)    Tolerated unasyn, zosyn & cephalosporins in the past.  Has patient had a PCN reaction causing immediate rash, facial/tongue/throat swelling, SOB or lightheadedness with hypotension: Yes Has patient had a PCN reaction causing severe rash involving mucus membranes or skin necrosis: Unk Has patient had a PCN reaction that required hospitalization: Unk Has patient had a PCN reaction occurring within the last 10 years: Unk If all of the above answers are "NO", then may proceed with Cephalosporins  . Other     Patient tolerates amoxicillin and cephalosporins    Patient Measurements: Weight: 158 lb 11.7 oz (72 kg)   Vital Signs: Temp: 98.2 F (36.8 C) (11/23 0423) Temp Source: Oral (11/23 0423) BP: 166/70 (11/23 0750) Pulse Rate: 69 (11/23 0423)  Labs: Recent Labs    02/21/17 0440 02/22/17 0436 02/23/17 0330  HGB 8.8* 8.9* 9.3*  HCT 27.0* 26.9* 27.8*  PLT 233 238 250  LABPROT 32.5* 34.7* 28.4*  INR 3.19 3.48 2.69  CREATININE 1.30* 1.03* 0.99    Estimated Creatinine Clearance: 36.8 mL/min (by C-G formula based on SCr of 0.99 mg/dL).    Assessment: Pt on warfarin for hx of afib and CVA.  Warfarin PTA per warfarin clinic 7.5mg  every Mon, Wed, Fri: 5mg  all other days.  INR on admit 2.88  02/23/2017  INR back down to therapeutic range, no warfarin last 2 days  CBC stable  No reported bleeding  Goal of Therapy:  INR 2-3   Plan:  1) Warfarin 2.5mg  this PM 2) Daily INR   Adrian Saran, PharmD, BCPS Pager [phone] 02/23/2017 7:58 AM

## 2017-02-23 NOTE — Progress Notes (Signed)
Pts. Blood pressure this a.m was 202/80. MD paged and order vasotec and hydralazine. Pts BP is now 166/70. Day shift nurse notified.

## 2017-02-23 NOTE — Progress Notes (Signed)
I notified Shannon [name] daughter that I had discharge papers on Shannon [name]. She verbalized that she would be taking Shannon [name] home on a car

## 2017-02-23 NOTE — Progress Notes (Signed)
Pharmacy Antibiotic Note  Shannon [name] is a 83 y.o. female admitted on 02/19/2017 with Intra-abdominal infection.  Pharmacy has been consulted for Meropenem dosing.  Plan: Day 4 Antibiotics 1) Continue meropenem 1g q12 per current renal function 2) What is plan for LOT?  Weight: 158 lb 11.7 oz (72 kg)  Temp (24hrs), Avg:98.2 F (36.8 C), Min:97.9 F (36.6 C), Max:98.6 F (37 C)  Recent Labs  Lab 02/20/17 0122 02/20/17 0134 02/21/17 0440 02/22/17 0436 02/23/17 0330  WBC 9.4  --  6.4 6.3 7.4  CREATININE  --  1.80* 1.30* 1.03* 0.99    Estimated Creatinine Clearance: 36.8 mL/min (by C-G formula based on SCr of 0.99 mg/dL).    Allergies  Allergen Reactions  . Penicillins Itching, Rash and Other (See Comments)    Tolerated unasyn, zosyn & cephalosporins in the past.  Has patient had a PCN reaction causing immediate rash, facial/tongue/throat swelling, SOB or lightheadedness with hypotension: Yes Has patient had a PCN reaction causing severe rash involving mucus membranes or skin necrosis: Unk Has patient had a PCN reaction that required hospitalization: Unk Has patient had a PCN reaction occurring within the last 10 years: Unk If all of the above answers are "NO", then may proceed with Cephalosporins  . Other     Patient tolerates amoxicillin and cephalosporins    Antimicrobials this admission: Meropenem 02/20/2017 >> Vancomycin 02/20/2017 x1  Dose adjustments this admission: -  Microbiology results: pending  Thank you for allowing pharmacy to be a part of this patient's care.  Adrian Saran, PharmD, BCPS Pager [phone] 02/23/2017 7:57 AM

## 2017-02-23 NOTE — Progress Notes (Signed)
Shannon [name] daughter is here to take her home. She verbalized understanding of new medications and new meds.Pt was d/c in a wheelchair

## 2017-02-23 NOTE — Progress Notes (Signed)
Spoke with patient at bedside. She states her daughter is the primary caregiver for her and wants me to talk with her. Called [name] on the phone, discussed home concerns/needs. She did not have any, states they understand how to care for the tube and will resume home care as previously done. No HH needs identified. Will continue to follow should needs change. [phone]

## 2017-02-23 NOTE — Evaluation (Signed)
Physical Therapy Evaluation Patient Details Name: Shannon [name] MRN: [id number] DOB: 10-13-1923 Today's Date: 02/23/2017   History of Present Illness  83 yo female admitted with complication of feeding tube. Hx of A fib, CVA, HTN, CHF, DM, CKD, hypothyroidism, PE, COPD, 09/2016 angioplasty popliteal artery, 09/25/16 L 2nd, 3rd toe amputaitons, ventral hernia on CT, DVT  Clinical Impression  On eval, pt was Min guard assist for mobility. She walked ~100 feet with a RW. O2 sat 99% on RA. Pt participated well. Plan is to return home with family assisting as needed. Will follow during hospital stay.     Follow Up Recommendations No PT follow up;Supervision/Assistance - 24 hour    Equipment Recommendations  None recommended by PT    Recommendations for Other Services       Precautions / Restrictions Precautions Precautions: Fall Precaution Comments: PEG Restrictions Weight Bearing Restrictions: No      Mobility  Bed Mobility Overal bed mobility: Modified Independent                Transfers Overall transfer level: Needs assistance Equipment used: Rolling walker (2 wheeled) Transfers: Sit to/from Stand Sit to Stand: Supervision         General transfer comment: for safety, lines  Ambulation/Gait Ambulation/Gait assistance: Min guard Ambulation Distance (Feet): 100 Feet Assistive device: Rolling walker (2 wheeled) Gait Pattern/deviations: Decreased stride length;Step-through pattern     General Gait Details: close guard for safety. O2 sat 99% on RA  Stairs            Wheelchair Mobility    Modified Rankin (Stroke Patients Only)       Balance                                             Pertinent Vitals/Pain Pain Assessment: No/denies pain    Home Living Family/patient expects to be discharged to:: Private residence Living Arrangements: Children Available Help at Discharge: Available 24 hours/day Type of Home: House Home  Access: Ramped entrance     Home Layout: One level Home Equipment: Environmental consultant - 4 wheels;Cane - single point;Hand held Careers information officer - 2 wheels;Bedside commode;Tub bench Additional Comments: has aide and family to assist her care    Prior Function Level of Independence: Needs assistance   Gait / Transfers Assistance Needed: walker for gait, has independent transfers prev  ADL's / Homemaking Assistance Needed: family care for home and cooking, help with bathing  Comments: Uses rollator during the day and RW at night.       Hand Dominance   Dominant Hand: Right    Extremity/Trunk Assessment   Upper Extremity Assessment Upper Extremity Assessment: Generalized weakness    Lower Extremity Assessment Lower Extremity Assessment: Generalized weakness    Cervical / Trunk Assessment Cervical / Trunk Assessment: Kyphotic  Communication   Communication: No difficulties  Cognition Arousal/Alertness: Awake/alert Behavior During Therapy: WFL for tasks assessed/performed Overall Cognitive Status: Within Functional Limits for tasks assessed                                        General Comments      Exercises     Assessment/Plan    PT Assessment Patient needs continued PT services  PT Problem List Decreased activity tolerance;Decreased mobility;Decreased balance;Decreased  strength       PT Treatment Interventions DME instruction;Gait training;Functional mobility training;Therapeutic activities;Therapeutic exercise;Balance training;Patient/family education    PT Goals (Current goals can be found in the Care Plan section)  Acute Rehab PT Goals Patient Stated Goal: homoe PT Goal Formulation: With patient Time For Goal Achievement: 03/09/17 Potential to Achieve Goals: Good    Frequency Min 3X/week   Barriers to discharge        Co-evaluation               AM-PAC PT "6 Clicks" Daily Activity  Outcome Measure Difficulty turning over in bed  (including adjusting bedclothes, sheets and blankets)?: None Difficulty moving from lying on back to sitting on the side of the bed? : None Difficulty sitting down on and standing up from a chair with arms (e.g., wheelchair, bedside commode, etc,.)?: A Little Help needed moving to and from a bed to chair (including a wheelchair)?: A Little Help needed walking in hospital room?: A Little Help needed climbing 3-5 steps with a railing? : A Little 6 Click Score: 20    End of Session Equipment Utilized During Treatment: Gait belt Activity Tolerance: Patient tolerated treatment well Patient left: in bed;with call bell/phone within reach   PT Visit Diagnosis: Muscle weakness (generalized) (M62.81);Difficulty in walking, not elsewhere classified (R26.2)    Time: 1126-1140 PT Time Calculation (min) (ACUTE ONLY): 14 min   Charges:   PT Evaluation $PT Eval Moderate Complexity: 1 Mod     PT G Codes:          Rebeca Alert, MPT Pager: [phone]

## 2017-02-25 LAB — CULTURE, BLOOD (ROUTINE X 2)
Culture: NO GROWTH
Culture: NO GROWTH
Special Requests: ADEQUATE
Special Requests: ADEQUATE

## 2017-02-26 ENCOUNTER — Telehealth: Payer: Self-pay

## 2017-02-26 ENCOUNTER — Other Ambulatory Visit: Payer: Self-pay

## 2017-02-26 MED ORDER — INSULIN ASPART 100 UNIT/ML FLEXPEN: PEN_INJECTOR | SUBCUTANEOUS | 11 refills | Status: DC

## 2017-02-26 NOTE — Telephone Encounter (Signed)
I have made the 1st attempt to contact the patient or family member in charge, in order to follow up from recently being discharged from the hospital. I left a message on voicemail but I will make another attempt at a different time.  

## 2017-02-27 ENCOUNTER — Ambulatory Visit (INDEPENDENT_AMBULATORY_CARE_PROVIDER_SITE_OTHER): Payer: Medicare Other

## 2017-02-27 DIAGNOSIS — Z5181 Encounter for therapeutic drug level monitoring: Secondary | ICD-10-CM | POA: Diagnosis not present

## 2017-02-27 DIAGNOSIS — I4892 Unspecified atrial flutter: Secondary | ICD-10-CM

## 2017-02-27 LAB — POCT INR: INR: 2.6

## 2017-02-27 NOTE — Patient Instructions (Signed)
Continue on same dosage 2 tablets daily except 3 tablets on Mondays, Wednesdays, and Fridays.  Recheck INR in 2 weeks. Call us with any medication changes or concerns [phone]. Main # [phone].

## 2017-02-27 NOTE — Telephone Encounter (Signed)
Pt's daughter Soledad Gerlach, returned call and left a voicemail stating that pt is doing much better but having some trouble with the feeding tube. Daughter states they will figure it out and have a follow up scheduled with PCP for 02/28/17. She states they will be fine until the apt. FYI! -MM

## 2017-02-28 ENCOUNTER — Encounter: Payer: Self-pay | Admitting: Internal Medicine

## 2017-02-28 ENCOUNTER — Ambulatory Visit (INDEPENDENT_AMBULATORY_CARE_PROVIDER_SITE_OTHER): Payer: Medicare Other | Admitting: Internal Medicine

## 2017-02-28 VITALS — BP 176/80 | HR 69 | Temp 97.9°F | Ht 65.0 in | Wt 155.8 lb

## 2017-02-28 DIAGNOSIS — E43 Unspecified severe protein-calorie malnutrition: Secondary | ICD-10-CM | POA: Diagnosis not present

## 2017-02-28 DIAGNOSIS — J449 Chronic obstructive pulmonary disease, unspecified: Secondary | ICD-10-CM | POA: Diagnosis not present

## 2017-02-28 DIAGNOSIS — R1013 Epigastric pain: Secondary | ICD-10-CM | POA: Diagnosis not present

## 2017-02-28 DIAGNOSIS — M542 Cervicalgia: Secondary | ICD-10-CM | POA: Diagnosis not present

## 2017-02-28 DIAGNOSIS — I70262 Atherosclerosis of native arteries of extremities with gangrene, left leg: Secondary | ICD-10-CM

## 2017-02-28 DIAGNOSIS — I10 Essential (primary) hypertension: Secondary | ICD-10-CM

## 2017-02-28 DIAGNOSIS — I480 Paroxysmal atrial fibrillation: Secondary | ICD-10-CM | POA: Diagnosis not present

## 2017-02-28 DIAGNOSIS — Z931 Gastrostomy status: Secondary | ICD-10-CM

## 2017-02-28 DIAGNOSIS — G8929 Other chronic pain: Secondary | ICD-10-CM

## 2017-02-28 DIAGNOSIS — I5032 Chronic diastolic (congestive) heart failure: Secondary | ICD-10-CM

## 2017-02-28 NOTE — Patient Instructions (Addendum)
Continue current medications as ordered - may hold blood pressure medication if BP <100/60  Continue Tube feedings as tolerated  Follow up with specialists as scheduled  Follow up in 2 mos for GOO s/p Peg, asthma/COPD, chronic HF

## 2017-02-28 NOTE — Progress Notes (Addendum)
Patient ID: Shannon [name], female   DOB: 08-09-1923, 83 y.o.   MRN: [id number]    Location:  [facility] of Service: OFFICE  Chief Complaint  Patient presents with  . Medical Management of Chronic Issues    1 Month Follow up Chronic Pain, DM, HTN, PAF    HPI:  83 yo female seen today for f/u. She was admitted to the hospital for PEG tube complication - peg tube actually fell out. She had CT abdomen that revealed large ventral hernia without obstruction.  IR replaced tube on 02/20/17. Daughter reports she still gets a lot of residual. Continues to have left abdominal upper pain. No f/c. No N/V but has a lot of belching.  HTN/afib/aflutter/hyperlipidemia/chronic dHF - BP stable on hydralazine, coreg and imdur. Cholesterol stable on lipitor. Rate controlled on coreg.  She takes coumadin for anticoagulation. INR Followed by cardio Dr Meda Coffee. EF 60-65%  GOO - s/p GJ tube. FT changed by IR. Takes Protonix BID. Constipation stable on lactulose. She gets TF at night (osmolite 1.5 cal 237 ml or 3.3 cans per day with promod 3 oz per day @ 22m.hr x 16hrs) and free water flushes (230 ml) BID. Drinks liquids po. Takes zofran prn. Albumin improved at 3.5  Hyponatremia/ hx SIADH- stable. she was taking salt tabs but noticed the entire tab in her stool intermittently and no longer takes it. Na 137  DM - previously diet controlled but now on insulin tx (novolog SSI and lantus qhs). A1c 7.7%. Takes gabapentin for neuropathy  Hx CVA - stable. Takes coumadin daily. No bloody stools. INR Goal 2-3  Depression - mood stable on sertraline  Arthritis - stable on roxicodone. She does take gabapentin for neuropathy. Tramadol is ineffective and she had increased confusion on norco. Tylenol ineffective.  Hypothyroidism - stable on levothyroxine 137 mcg daily. TSH 0.67  Hx Pressure sore/right 3rd toe osteomyelitis -she noticed more bleeding from right toe and plans to f/u with podiatry. She completed 1 month of  augmentin for osteo. 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 Followed by podiatry.   PAD - stable. s/p left SFA stent 09/21/16. ABI on 09/24/16 showed right 0.86; left 0.96. followed by vascular sx.  COPD/asthma -  stable on dulera, xopenex hfa, duonebs. Followed by pulmonary.  Past Medical History:  Diagnosis Date  . Acute respiratory failure ([facility]   . Anemia    previous blood transfusions  . Arthritis    "all over"  . Asthma   . Bradycardia    requiring previous d/c of BB and reduction of amiodarone  . CAD (coronary artery disease)    nonobstructive per notes  . Chronic diastolic CHF (congestive heart failure) ([facility]   . CKD (chronic kidney disease), stage III ([facility]   . Complication of blood transfusion    "[facility] [place] [facility] ~ [facility]; no adverse reaction that we are aware of"/daughter, VAdonis Huguenin(01/27/2016)  . COPD (ch[place] obstructive pulmonary disease) ([facility]   . Depression    "light case"  . DVT (deep venous thrombosis) ([facility] 01/2016   a. LLE DVT 01/2016 - switched from Eliquis to Coumadin.  . Gastric stenosis    a. s/p stomach tube  . GERD (gastroesophageal reflux disease)   . History of blood transfusion    "several" (01/27/2016)  . History of stomach ulcers   . Hyperlipidemia   . Hypertension   . Hypothyroidism   . Paraesophageal hernia   .  Patient ID: Shannon [name], female   DOB: 08-09-1923, 83 y.o.   MRN: [id number]    Location:  [facility] of Service: OFFICE  Chief Complaint  Patient presents with  . Medical Management of Chronic Issues    1 Month Follow up Chronic Pain, DM, HTN, PAF    HPI:  83 yo female seen today for f/u. She was admitted to the hospital for PEG tube complication - peg tube actually fell out. She had CT abdomen that revealed large ventral hernia without obstruction.  IR replaced tube on 02/20/17. Daughter reports she still gets a lot of residual. Continues to have left abdominal upper pain. No f/c. No N/V but has a lot of belching.  HTN/afib/aflutter/hyperlipidemia/chronic dHF - BP stable on hydralazine, coreg and imdur. Cholesterol stable on lipitor. Rate controlled on coreg.  She takes coumadin for anticoagulation. INR Followed by cardio Dr Meda Coffee. EF 60-65%  GOO - s/p GJ tube. FT changed by IR. Takes Protonix BID. Constipation stable on lactulose. She gets TF at night (osmolite 1.5 cal 237 ml or 3.3 cans per day with promod 3 oz per day @ 22m.hr x 16hrs) and free water flushes (230 ml) BID. Drinks liquids po. Takes zofran prn. Albumin improved at 3.5  Hyponatremia/ hx SIADH- stable. she was taking salt tabs but noticed the entire tab in her stool intermittently and no longer takes it. Na 137  DM - previously diet controlled but now on insulin tx (novolog SSI and lantus qhs). A1c 7.7%. Takes gabapentin for neuropathy  Hx CVA - stable. Takes coumadin daily. No bloody stools. INR Goal 2-3  Depression - mood stable on sertraline  Arthritis - stable on roxicodone. She does take gabapentin for neuropathy. Tramadol is ineffective and she had increased confusion on norco. Tylenol ineffective.  Hypothyroidism - stable on levothyroxine 137 mcg daily. TSH 0.67  Hx Pressure sore/right 3rd toe osteomyelitis -she noticed more bleeding from right toe and plans to f/u with podiatry. She completed 1 month of  augmentin for osteo. 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 Followed by podiatry.   PAD - stable. s/p left SFA stent 09/21/16. ABI on 09/24/16 showed right 0.86; left 0.96. followed by vascular sx.  COPD/asthma -  stable on dulera, xopenex hfa, duonebs. Followed by pulmonary.  Past Medical History:  Diagnosis Date  . Acute respiratory failure ([facility]   . Anemia    previous blood transfusions  . Arthritis    "all over"  . Asthma   . Bradycardia    requiring previous d/c of BB and reduction of amiodarone  . CAD (coronary artery disease)    nonobstructive per notes  . Chronic diastolic CHF (congestive heart failure) ([facility]   . CKD (chronic kidney disease), stage III ([facility]   . Complication of blood transfusion    "[facility] the [facility] ~ [facility]; no adverse reaction that we are aware of"/daughter, VAdonis Huguenin(01/27/2016)  . COPD (ch[place] obstructive pulmonary disease) ([facility]   . Depression    "light case"  . DVT (deep venous thrombosis) ([facility] 01/2016   a. LLE DVT 01/2016 - switched from Eliquis to Coumadin.  . Gastric stenosis    a. s/p stomach tube  . GERD (gastroesophageal reflux disease)   . History of blood transfusion    "several" (01/27/2016)  . History of stomach ulcers   . Hyperlipidemia   . Hypertension   . Hypothyroidism   . Paraesophageal hernia   .  Magnus Ivan, MD;  Location: [facility] OR;  Service: General;  Laterality: N/A;  . LOWER EXTREMITY ANGIOGRAPHY Left 09/21/2016   Procedure: Lower Extremity Angiography;  Surgeon: Maeola Harman, MD;  Location: [facility] INVASIVE CV LAB;  Service: Cardiovascular;  Laterality: Left;  . LYSIS OF ADHESION N/A 01/20/2015   Procedure: LYSIS OF ADHESIONS < 1 HOUR;  Surgeon: Abigail Miyamoto, MD;  Location: [facility] OR;  Service: General;  Laterality: N/A;  . PERIPHERAL VASCULAR BALLOON ANGIOPLASTY Left 09/21/2016   Procedure: Peripheral Vascular Balloon Angioplasty;  Surgeon: Maeola Harman, MD;  Location: [facility] INVASIVE CV LAB;  Service: Cardiovascular;  Laterality: Left;  drug coated balloon  . TONSILLECTOMY    . TUBAL LIGATION    . VENTRAL HERNIA REPAIR  2015   incarcerated ventral hernia ([facility] 09/2013)/notes 01/10/2015    Patient Care Team: Kirt Boys, DO as PCP - General (Internal Medicine) Felecia Shelling, DPM as Consulting Physician (Podiatry) Lars Masson, MD as Consulting Physician (Cardiology)  Social  History   Socioeconomic History  . Marital status: Widowed    Spouse name: Not on file  . Number of children: 3  . Years of education: Not on file  . Highest education level: Not on file  Social Needs  . Financial resource strain: Not on file  . Food insecurity - worry: Not on file  . Food insecurity - inability: Not on file  . Transportation needs - medical: Not on file  . Transportation needs - non-medical: Not on file  Occupational History  . Not on file  Tobacco Use  . Smoking status: Never Smoker  . Smokeless tobacco: Former Neurosurgeon    Types: Snuff  . Tobacco comment: "used snuff in my younger days"  Substance and Sexual Activity  . Alcohol use: No    Alcohol/week: 0.0 oz  . Drug use: No  . Sexual activity: No  Other Topics Concern  . Not on file  Social History Narrative   Married in [place], lives in one story house with 2 people and no pets   Occupation: ?   Has a living Will, [place], and doesn't have DNR   Was living with Husband (in frail health) in [place] until 09/2013.  After her [facility] admission they both came to live with daughter in [place]     reports that  has never smoked. She has quit using smokeless tobacco. Her smokeless tobacco use included snuff. She reports that she does not drink alcohol or use drugs.  Family History  Problem Relation Age of Onset  . Stroke Mother   . Hypertension Mother   . Diabetes Brother   . Glaucoma Son   . Glaucoma Son   . Heart attack Neg Hx    Family Status  Relation Name Status  . Mother  Deceased  . Father  Deceased  . MGM  Deceased  . MGF  Deceased  . PGM  Deceased  . PGF  Deceased  . Brother  Other  . Daughter Anastasia Fiedler  . Son Tiva Popko Alive  . Son Aylinn Mehle Alive  . Neg Hx  (Not Specified)     Allergies  Allergen Reactions  . Penicillins Itching, Rash and Other (See Comments)    Tolerated unasyn, zosyn & cephalosporins in the past.  Has patient had a PCN reaction causing immediate rash,  facial/tongue/throat swelling, SOB or lightheadedness with hypotension: Yes Has patient had a PCN reaction causing severe rash involving mucus membranes or skin necrosis: Unk Has patient  Patient ID: Shannon [name], female   DOB: 08-09-1923, 83 y.o.   MRN: [id number]    Location:  [facility] of Service: OFFICE  Chief Complaint  Patient presents with  . Medical Management of Chronic Issues    1 Month Follow up Chronic Pain, DM, HTN, PAF    HPI:  83 yo female seen today for f/u. She was admitted to the hospital for PEG tube complication - peg tube actually fell out. She had CT abdomen that revealed large ventral hernia without obstruction.  IR replaced tube on 02/20/17. Daughter reports she still gets a lot of residual. Continues to have left abdominal upper pain. No f/c. No N/V but has a lot of belching.  HTN/afib/aflutter/hyperlipidemia/chronic dHF - BP stable on hydralazine, coreg and imdur. Cholesterol stable on lipitor. Rate controlled on coreg.  She takes coumadin for anticoagulation. INR Followed by cardio Dr Meda Coffee. EF 60-65%  GOO - s/p GJ tube. FT changed by IR. Takes Protonix BID. Constipation stable on lactulose. She gets TF at night (osmolite 1.5 cal 237 ml or 3.3 cans per day with promod 3 oz per day @ 22m.hr x 16hrs) and free water flushes (230 ml) BID. Drinks liquids po. Takes zofran prn. Albumin improved at 3.5  Hyponatremia/ hx SIADH- stable. she was taking salt tabs but noticed the entire tab in her stool intermittently and no longer takes it. Na 137  DM - previously diet controlled but now on insulin tx (novolog SSI and lantus qhs). A1c 7.7%. Takes gabapentin for neuropathy  Hx CVA - stable. Takes coumadin daily. No bloody stools. INR Goal 2-3  Depression - mood stable on sertraline  Arthritis - stable on roxicodone. She does take gabapentin for neuropathy. Tramadol is ineffective and she had increased confusion on norco. Tylenol ineffective.  Hypothyroidism - stable on levothyroxine 137 mcg daily. TSH 0.67  Hx Pressure sore/right 3rd toe osteomyelitis -she noticed more bleeding from right toe and plans to f/u with podiatry. She completed 1 month of  augmentin for osteo. 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 Followed by podiatry.   PAD - stable. s/p left SFA stent 09/21/16. ABI on 09/24/16 showed right 0.86; left 0.96. followed by vascular sx.  COPD/asthma -  stable on dulera, xopenex hfa, duonebs. Followed by pulmonary.  Past Medical History:  Diagnosis Date  . Acute respiratory failure ([facility]   . Anemia    previous blood transfusions  . Arthritis    "all over"  . Asthma   . Bradycardia    requiring previous d/c of BB and reduction of amiodarone  . CAD (coronary artery disease)    nonobstructive per notes  . Chronic diastolic CHF (congestive heart failure) ([facility]   . CKD (chronic kidney disease), stage III ([facility]   . Complication of blood transfusion    "got the [facility] type [place] [facility] ~ [facility]; no adverse reaction that we are aware of"/daughter, VAdonis Huguenin(01/27/2016)  . COPD (ch[place] obstructive pulmonary disease) ([facility]   . Depression    "light case"  . DVT (deep venous thrombosis) ([facility] 01/2016   a. LLE DVT 01/2016 - switched from Eliquis to Coumadin.  . Gastric stenosis    a. s/p stomach tube  . GERD (gastroesophageal reflux disease)   . History of blood transfusion    "several" (01/27/2016)  . History of stomach ulcers   . Hyperlipidemia   . Hypertension   . Hypothyroidism   . Paraesophageal hernia   .  Patient ID: Shannon [name], female   DOB: 08-09-1923, 83 y.o.   MRN: [id number]    Location:  [facility] of Service: OFFICE  Chief Complaint  Patient presents with  . Medical Management of Chronic Issues    1 Month Follow up Chronic Pain, DM, HTN, PAF    HPI:  83 yo female seen today for f/u. She was admitted to the hospital for PEG tube complication - peg tube actually fell out. She had CT abdomen that revealed large ventral hernia without obstruction.  IR replaced tube on 02/20/17. Daughter reports she still gets a lot of residual. Continues to have left abdominal upper pain. No f/c. No N/V but has a lot of belching.  HTN/afib/aflutter/hyperlipidemia/chronic dHF - BP stable on hydralazine, coreg and imdur. Cholesterol stable on lipitor. Rate controlled on coreg.  She takes coumadin for anticoagulation. INR Followed by cardio Dr Meda Coffee. EF 60-65%  GOO - s/p GJ tube. FT changed by IR. Takes Protonix BID. Constipation stable on lactulose. She gets TF at night (osmolite 1.5 cal 237 ml or 3.3 cans per day with promod 3 oz per day @ 22m.hr x 16hrs) and free water flushes (230 ml) BID. Drinks liquids po. Takes zofran prn. Albumin improved at 3.5  Hyponatremia/ hx SIADH- stable. she was taking salt tabs but noticed the entire tab in her stool intermittently and no longer takes it. Na 137  DM - previously diet controlled but now on insulin tx (novolog SSI and lantus qhs). A1c 7.7%. Takes gabapentin for neuropathy  Hx CVA - stable. Takes coumadin daily. No bloody stools. INR Goal 2-3  Depression - mood stable on sertraline  Arthritis - stable on roxicodone. She does take gabapentin for neuropathy. Tramadol is ineffective and she had increased confusion on norco. Tylenol ineffective.  Hypothyroidism - stable on levothyroxine 137 mcg daily. TSH 0.67  Hx Pressure sore/right 3rd toe osteomyelitis -she noticed more bleeding from right toe and plans to f/u with podiatry. She completed 1 month of  augmentin for osteo. 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 Followed by podiatry.   PAD - stable. s/p left SFA stent 09/21/16. ABI on 09/24/16 showed right 0.86; left 0.96. followed by vascular sx.  COPD/asthma -  stable on dulera, xopenex hfa, duonebs. Followed by pulmonary.  Past Medical History:  Diagnosis Date  . Acute respiratory failure ([facility]   . Anemia    previous blood transfusions  . Arthritis    "all over"  . Asthma   . Bradycardia    requiring previous d/c of BB and reduction of amiodarone  . CAD (coronary artery disease)    nonobstructive per notes  . Chronic diastolic CHF (congestive heart failure) ([facility]   . CKD (chronic kidney disease), stage III ([facility]   . Complication of blood transfusion    "got the [facility] type [facility] ~ [facility]; no adverse reaction that we are aware of"/daughter, VAdonis Huguenin(01/27/2016)  . COPD (chronic obstructive pulmonary disease) ([facility]   . Depression    "light case"  . DVT (deep venous thrombosis) ([facility] 01/2016   a. LLE DVT 01/2016 - switched from Eliquis to Coumadin.  . Gastric stenosis    a. s/p stomach tube  . GERD (gastroesophageal reflux disease)   . History of blood transfusion    "several" (01/27/2016)  . History of stomach ulcers   . Hyperlipidemia   . Hypertension   . Hypothyroidism   . Paraesophageal hernia   .  Patient ID: Shannon [name], female   DOB: 08-09-1923, 83 y.o.   MRN: [id number]    Location:  [facility] Place of Service: OFFICE  Chief Complaint  Patient presents with  . Medical Management of Chronic Issues    1 Month Follow up Chronic Pain, DM, HTN, PAF    HPI:  83 yo female seen today for f/u. She was admitted to the hospital for PEG tube complication - peg tube actually fell out. She had CT abdomen that revealed large ventral hernia without obstruction.  IR replaced tube on 02/20/17. Daughter reports she still gets a lot of residual. Continues to have left abdominal upper pain. No f/c. No N/V but has a lot of belching.  HTN/afib/aflutter/hyperlipidemia/chronic dHF - BP stable on hydralazine, coreg and imdur. Cholesterol stable on lipitor. Rate controlled on coreg.  She takes coumadin for anticoagulation. INR Followed by cardio Dr Meda Coffee. EF 60-65%  GOO - s/p GJ tube. FT changed by IR. Takes Protonix BID. Constipation stable on lactulose. She gets TF at night (osmolite 1.5 cal 237 ml or 3.3 cans per day with promod 3 oz per day @ 22m.hr x 16hrs) and free water flushes (230 ml) BID. Drinks liquids po. Takes zofran prn. Albumin improved at 3.5  Hyponatremia/ hx SIADH- stable. she was taking salt tabs but noticed the entire tab in her stool intermittently and no longer takes it. Na 137  DM - previously diet controlled but now on insulin tx (novolog SSI and lantus qhs). A1c 7.7%. Takes gabapentin for neuropathy  Hx CVA - stable. Takes coumadin daily. No bloody stools. INR Goal 2-3  Depression - mood stable on sertraline  Arthritis - stable on roxicodone. She does take gabapentin for neuropathy. Tramadol is ineffective and she had increased confusion on norco. Tylenol ineffective.  Hypothyroidism - stable on levothyroxine 137 mcg daily. TSH 0.67  Hx Pressure sore/right 3rd toe osteomyelitis -she noticed more bleeding from right toe and plans to f/u with podiatry. She completed 1 month of  augmentin for osteo. 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 Followed by podiatry.   PAD - stable. s/p left SFA stent 09/21/16. ABI on 09/24/16 showed right 0.86; left 0.96. followed by vascular sx.  COPD/asthma -  stable on dulera, xopenex hfa, duonebs. Followed by pulmonary.  Past Medical History:  Diagnosis Date  . Acute respiratory failure ([facility]   . Anemia    previous blood transfusions  . Arthritis    "all over"  . Asthma   . Bradycardia    requiring previous d/c of BB and reduction of amiodarone  . CAD (coronary artery disease)    nonobstructive per notes  . Chronic diastolic CHF (congestive heart failure) ([facility]   . CKD (chronic kidney disease), stage III ([facility]   . Complication of blood transfusion    "[facility] the [facility] [place] [facility] ~ [facility]; no adverse reaction that we are aware of"/daughter, VAdonis Huguenin(01/27/2016)  . COPD (ch[place] obstructive pulmonary disease) ([facility]   . Depression    "light case"  . DVT (deep venous thrombosis) ([facility] 01/2016   a. LLE DVT 01/2016 - switched from Eliquis to Coumadin.  . Gastric stenosis    a. s/p stomach tube  . GERD (gastroesophageal reflux disease)   . History of blood transfusion    "several" (01/27/2016)  . History of stomach ulcers   . Hyperlipidemia   . Hypertension   . Hypothyroidism   . Paraesophageal hernia   .  treatment for MRSA infections.   . Glucose-Capillary 02/20/2017 169* 65 - 99 mg/dL Final  . Comment 1  09/81/1914 Notify RN   Final  . Comment 2 02/20/2017 Document in Chart   Final  . Sodium, Ur 02/20/2017 95  mmol/L Final   Performed at [facility], [address] [place], [place] [address]  . Creatinine, Urine 02/20/2017 49.33  mg/dL Final   Performed at [facility], [address] [place], [place] [address]  . Glucose-Capillary 02/20/2017 54* 65 - 99 mg/dL Final  . Comment 1 86/57/8469 Notify RN   Final  . Comment 2 02/20/2017 Document in Chart   Final  . Prothrombin Time 02/21/2017 32.5* 11.4 - 15.2 seconds Final  . INR 02/21/2017 3.19   Final  . WBC 02/21/2017 6.4  4.0 - 10.5 K/uL Final  . RBC 02/21/2017 2.87* 3.87 - 5.11 MIL/uL Final  . Hemoglobin 02/21/2017 8.8* 12.0 - 15.0 g/dL Final  . HCT 62/95/2841 27.0* 36.0 - 46.0 % Final  . MCV 02/21/2017 94.1  78.0 - 100.0 fL Final  . MCH 02/21/2017 30.7  26.0 - 34.0 pg Final  . MCHC 02/21/2017 32.6  30.0 - 36.0 g/dL Final  . RDW 32/44/0102 16.4* 11.5 - 15.5 % Final  . Platelets 02/21/2017 233  150 - 400 K/uL Final  . Neutrophils Relative % 02/21/2017 51  % Final  . Neutro Abs 02/21/2017 3.3  1.7 - 7.7 K/uL Final  . Lymphocytes Relative 02/21/2017 17  % Final  . Lymphs Abs 02/21/2017 1.1  0.7 - 4.0 K/uL Final  . Monocytes Relative 02/21/2017 14  % Final  . Monocytes Absolute 02/21/2017 0.9  0.1 - 1.0 K/uL Final  . Eosinophils Relative 02/21/2017 17  % Final  . Eosinophils Absolute 02/21/2017 1.1* 0.0 - 0.7 K/uL Final  . Basophils Relative 02/21/2017 1  % Final  . Basophils Absolute 02/21/2017 0.1  0.0 - 0.1 K/uL Final  . Sodium 02/21/2017 144  135 - 145 mmol/L Final  . Potassium 02/21/2017 4.4  3.5 - 5.1 mmol/L Final  . Chloride 02/21/2017 114* 101 - 111 mmol/L Final  . CO2 02/21/2017 26  22 - 32 mmol/L Final  . Glucose, Bld 02/21/2017 138* 65 - 99 mg/dL Final  . BUN 72/53/6644 53* 6 - 20 mg/dL Final  . Creatinine, Ser 02/21/2017 1.30* 0.44 - 1.00 mg/dL Final  . Calcium 03/47/4259 9.0  8.9 - 10.3 mg/dL Final  . GFR  calc non Af Amer 02/21/2017 35* >60 mL/min Final  . GFR calc Af Amer 02/21/2017 40* >60 mL/min Final   Comment: (NOTE) The eGFR has been calculated using the CKD EPI equation. This calculation has not been validated in all clinical situations. eGFR's persistently <60 mL/min signify possible Chronic Kidney Disease.   . Anion gap 02/21/2017 4* 5 - 15 Final  . Glucose-Capillary 02/20/2017 125* 65 - 99 mg/dL Final  . Glucose-Capillary 02/20/2017 132* 65 - 99 mg/dL Final  . Glucose-Capillary 02/20/2017 114* 65 - 99 mg/dL Final  . Glucose-Capillary 02/21/2017 126* 65 - 99 mg/dL Final  . Glucose-Capillary 02/21/2017 138* 65 - 99 mg/dL Final  . Glucose-Capillary 02/21/2017 173* 65 - 99 mg/dL Final  . Prothrombin Time 02/22/2017 34.7* 11.4 - 15.2 seconds Final  . INR 02/22/2017 3.48   Final  . WBC 02/22/2017 6.3  4.0 - 10.5 K/uL Final  . RBC 02/22/2017 2.88* 3.87 - 5.11 MIL/uL Final  . Hemoglobin 02/22/2017 8.9* 12.0 - 15.0 g/dL Final  .  Patient ID: Shannon [name], female   DOB: 08-09-1923, 83 y.o.   MRN: [id number]    Location:  [facility] Place of Service: OFFICE  Chief Complaint  Patient presents with  . Medical Management of Chronic Issues    1 Month Follow up Chronic Pain, DM, HTN, PAF    HPI:  83 yo female seen today for f/u. She was admitted to the hospital for PEG tube complication - peg tube actually fell out. She had CT abdomen that revealed large ventral hernia without obstruction.  IR replaced tube on 02/20/17. Daughter reports she still gets a lot of residual. Continues to have left abdominal upper pain. No f/c. No N/V but has a lot of belching.  HTN/afib/aflutter/hyperlipidemia/chronic dHF - BP stable on hydralazine, coreg and imdur. Cholesterol stable on lipitor. Rate controlled on coreg.  She takes coumadin for anticoagulation. INR Followed by cardio Dr Meda Coffee. EF 60-65%  GOO - s/p GJ tube. FT changed by IR. Takes Protonix BID. Constipation stable on lactulose. She gets TF at night (osmolite 1.5 cal 237 ml or 3.3 cans per day with promod 3 oz per day @ 22m.hr x 16hrs) and free water flushes (230 ml) BID. Drinks liquids po. Takes zofran prn. Albumin improved at 3.5  Hyponatremia/ hx SIADH- stable. she was taking salt tabs but noticed the entire tab in her stool intermittently and no longer takes it. Na 137  DM - previously diet controlled but now on insulin tx (novolog SSI and lantus qhs). A1c 7.7%. Takes gabapentin for neuropathy  Hx CVA - stable. Takes coumadin daily. No bloody stools. INR Goal 2-3  Depression - mood stable on sertraline  Arthritis - stable on roxicodone. She does take gabapentin for neuropathy. Tramadol is ineffective and she had increased confusion on norco. Tylenol ineffective.  Hypothyroidism - stable on levothyroxine 137 mcg daily. TSH 0.67  Hx Pressure sore/right 3rd toe osteomyelitis -she noticed more bleeding from right toe and plans to f/u with podiatry. She completed 1 month of  augmentin for osteo. 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 Followed by podiatry.   PAD - stable. s/p left SFA stent 09/21/16. ABI on 09/24/16 showed right 0.86; left 0.96. followed by vascular sx.  COPD/asthma -  stable on dulera, xopenex hfa, duonebs. Followed by pulmonary.  Past Medical History:  Diagnosis Date  . Acute respiratory failure ([facility]   . Anemia    previous blood transfusions  . Arthritis    "all over"  . Asthma   . Bradycardia    requiring previous d/c of BB and reduction of amiodarone  . CAD (coronary artery disease)    nonobstructive per notes  . Chronic diastolic CHF (congestive heart failure) ([facility]   . CKD (chronic kidney disease), stage III ([facility]   . Complication of blood transfusion    "got the wrong [facility] type [facility] ~ [facility]; no adverse reaction that we are aware of"/daughter, VAdonis Huguenin(01/27/2016)  . COPD ([place] obstructive pulmonary disease) ([facility]   . Depression    "light case"  . DVT (deep venous thrombosis) ([facility] 01/2016   a. LLE DVT 01/2016 - switched from Eliquis to Coumadin.  . Gastric stenosis    a. s/p stomach tube  . GERD (gastroesophageal reflux disease)   . History of blood transfusion    "several" (01/27/2016)  . History of stomach ulcers   . Hyperlipidemia   . Hypertension   . Hypothyroidism   . Paraesophageal hernia   .  Patient ID: Shannon [name], female   DOB: 08-09-1923, 83 y.o.   MRN: [id number]    Location:  [facility] Place of Service: OFFICE  Chief Complaint  Patient presents with  . Medical Management of Chronic Issues    1 Month Follow up Chronic Pain, DM, HTN, PAF    HPI:  83 yo female seen today for f/u. She was admitted to the hospital for PEG tube complication - peg tube actually fell out. She had CT abdomen that revealed large ventral hernia without obstruction.  IR replaced tube on 02/20/17. Daughter reports she still gets a lot of residual. Continues to have left abdominal upper pain. No f/c. No N/V but has a lot of belching.  HTN/afib/aflutter/hyperlipidemia/chronic dHF - BP stable on hydralazine, coreg and imdur. Cholesterol stable on lipitor. Rate controlled on coreg.  She takes coumadin for anticoagulation. INR Followed by cardio Dr Meda Coffee. EF 60-65%  GOO - s/p GJ tube. FT changed by IR. Takes Protonix BID. Constipation stable on lactulose. She gets TF at night (osmolite 1.5 cal 237 ml or 3.3 cans per day with promod 3 oz per day @ 22m.hr x 16hrs) and free water flushes (230 ml) BID. Drinks liquids po. Takes zofran prn. Albumin improved at 3.5  Hyponatremia/ hx SIADH- stable. she was taking salt tabs but noticed the entire tab in her stool intermittently and no longer takes it. Na 137  DM - previously diet controlled but now on insulin tx (novolog SSI and lantus qhs). A1c 7.7%. Takes gabapentin for neuropathy  Hx CVA - stable. Takes coumadin daily. No bloody stools. INR Goal 2-3  Depression - mood stable on sertraline  Arthritis - stable on roxicodone. She does take gabapentin for neuropathy. Tramadol is ineffective and she had increased confusion on norco. Tylenol ineffective.  Hypothyroidism - stable on levothyroxine 137 mcg daily. TSH 0.67  Hx Pressure sore/right 3rd toe osteomyelitis -she noticed more bleeding from right toe and plans to f/u with podiatry. She completed 1 month of  augmentin for osteo. 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 Followed by podiatry.   PAD - stable. s/p left SFA stent 09/21/16. ABI on 09/24/16 showed right 0.86; left 0.96. followed by vascular sx.  COPD/asthma -  stable on dulera, xopenex hfa, duonebs. Followed by pulmonary.  Past Medical History:  Diagnosis Date  . Acute respiratory failure ([facility]   . Anemia    previous blood transfusions  . Arthritis    "all over"  . Asthma   . Bradycardia    requiring previous d/c of BB and reduction of amiodarone  . CAD (coronary artery disease)    nonobstructive per notes  . Chronic diastolic CHF (congestive heart failure) ([facility]   . CKD (chronic kidney disease), stage III ([place]   . Complication of blood transfusion    "got the [facility] type [facility] ~ [facility]; no adverse reaction that we are aware of"/daughter, VAdonis Huguenin(01/27/2016)  . COPD ([place] pulmonary disease) ([facility]   . Depression    "light case"  . DVT (deep venous thrombosis) ([facility] 01/2016   a. LLE DVT 01/2016 - switched from Eliquis to Coumadin.  . Gastric stenosis    a. s/p stomach tube  . GERD (gastroesophageal reflux disease)   . History of blood transfusion    "several" (01/27/2016)  . History of stomach ulcers   . Hyperlipidemia   . Hypertension   . Hypothyroidism   . Paraesophageal hernia   .  Patient ID: Shannon [name], female   DOB: 08-09-1923, 83 y.o.   MRN: [id number]    Location:  [facility] Place of Service: OFFICE  Chief Complaint  Patient presents with  . Medical Management of Chronic Issues    1 Month Follow up Chronic Pain, DM, HTN, PAF    HPI:  83 yo female seen today for f/u. She was admitted to the hospital for PEG tube complication - peg tube actually fell out. She had CT abdomen that revealed large ventral hernia without obstruction.  IR replaced tube on 02/20/17. Daughter reports she still gets a lot of residual. Continues to have left abdominal upper pain. No f/c. No N/V but has a lot of belching.  HTN/afib/aflutter/hyperlipidemia/chronic dHF - BP stable on hydralazine, coreg and imdur. Cholesterol stable on lipitor. Rate controlled on coreg.  She takes coumadin for anticoagulation. INR Followed by cardio Dr Meda Coffee. EF 60-65%  GOO - s/p GJ tube. FT changed by IR. Takes Protonix BID. Constipation stable on lactulose. She gets TF at night (osmolite 1.5 cal 237 ml or 3.3 cans per day with promod 3 oz per day @ 22m.hr x 16hrs) and free water flushes (230 ml) BID. Drinks liquids po. Takes zofran prn. Albumin improved at 3.5  Hyponatremia/ hx SIADH- stable. she was taking salt tabs but noticed the entire tab in her stool intermittently and no longer takes it. Na 137  DM - previously diet controlled but now on insulin tx (novolog SSI and lantus qhs). A1c 7.7%. Takes gabapentin for neuropathy  Hx CVA - stable. Takes coumadin daily. No bloody stools. INR Goal 2-3  Depression - mood stable on sertraline  Arthritis - stable on roxicodone. She does take gabapentin for neuropathy. Tramadol is ineffective and she had increased confusion on norco. Tylenol ineffective.  Hypothyroidism - stable on levothyroxine 137 mcg daily. TSH 0.67  Hx Pressure sore/right 3rd toe osteomyelitis -she noticed more bleeding from right toe and plans to f/u with podiatry. She completed 1 month of  augmentin for osteo. 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 Followed by podiatry.   PAD - stable. s/p left SFA stent 09/21/16. ABI on 09/24/16 showed right 0.86; left 0.96. followed by vascular sx.  COPD/asthma -  stable on dulera, xopenex hfa, duonebs. Followed by pulmonary.  Past Medical History:  Diagnosis Date  . Acute respiratory failure ([facility]   . Anemia    previous blood transfusions  . Arthritis    "all over"  . Asthma   . Bradycardia    requiring previous d/c of BB and reduction of amiodarone  . CAD (coronary artery disease)    nonobstructive per notes  . Chronic diastolic CHF (congestive heart failure) ([facility]   . CKD (chronic kidney disease), stage III ([facility]   . Complication of blood transfusion    "got the [facility] type [facility] ~ [facility]; no adverse reaction that we are aware of"/daughter, VAdonis Huguenin(01/27/2016)  . COPD ([place] pulmonary disease) ([place]   . Depression    "light case"  . DVT (deep venous thrombosis) ([facility] 01/2016   a. LLE DVT 01/2016 - switched from Eliquis to Coumadin.  . Gastric stenosis    a. s/p stomach tube  . GERD (gastroesophageal reflux disease)   . History of blood transfusion    "several" (01/27/2016)  . History of stomach ulcers   . Hyperlipidemia   . Hypertension   . Hypothyroidism   . Paraesophageal hernia   .  and positioned with the distal tip in the proximal jejunum. This was consist firm to by contrast injection under fluoroscopy. An image was saved. The catheter was then flushed with saline. The retention  balloon was inflated with 9 mL water and pulled tight against the anterior abdominal wall. IMPRESSION: Successful replacement of a new 22 French gastrojejunostomy tube. The J arm tip terminates in the proximal small bowel and is ready for immediate use. Signed, Sterling Big, MD [facility] Electronically Signed   By: Malachy Moan M.D.   On: 02/20/2017 14:17     Assessment/Plan   ICD-10-CM   1. Abdominal pain, chronic, epigastric R10.13    G89.29   2. PEG (percutaneous endoscopic gastrostomy) status (HCC) Z93.1   3. Chronic diastolic CHF (congestive heart failure) (HCC) I50.32   4. Essential hypertension, benign I10   5. Chronic neck pain M54.2    G89.29   6. Paroxysmal atrial fibrillation (HCC) I48.0   7. COPD with asthma (HCC) J44.9   8.      Severe protein calorie malnutrition - resolved  Needs labs next OV - lipid panel, a1c  Repeat BP 150/74  Continue current medications as ordered - may hold blood pressure medication if BP <100/60  Continue Tube feedings as tolerated  Follow up with specialists as scheduled  Follow up in 2 mos for GOO s/p Peg, asthma/COPD, chronic HF  Zeanna Sunde S. Ancil Linsey  [facility] [address] [place], [place] [address] [phone] Cell (Monday-Friday 8 AM - 5 PM) [phone] After 5 PM and follow prompts

## 2017-03-05 ENCOUNTER — Telehealth: Payer: Self-pay | Admitting: *Deleted

## 2017-03-05 ENCOUNTER — Other Ambulatory Visit: Payer: Self-pay | Admitting: Cardiology

## 2017-03-05 NOTE — Telephone Encounter (Signed)
Whichever med is due at the time, hold that one(s). Focus more on the top number

## 2017-03-05 NOTE — Telephone Encounter (Signed)
[name] notified and agreed.  °

## 2017-03-05 NOTE — Telephone Encounter (Signed)
Patient caregiver, [name] called and stated that since last OV Patient's BP has been running low. Please Advise.   Patient called c/o low blood pressure  1. What are your blood pressure readings?  02/25/17- 135/64 am 70    105/54 pm 74  02/26/17- 125/52 am 83    126/50 pm 81  02/27/17- 128/57 am 80    99/54 pm 79  02/28/17- 128/54 am 77    122/58 pm 79  03/01/17- 102/53 am 80    133/61 pm 79  03/02/17- 116/53 am 74    108/48 pm 79  03/03/17-  109/54 am 78      108/50 pm 80  03/04/17-  128/64 am 74      113/51 pm 78  2. When did you check your blood pressure (before or after medications)? Checking BP after medications  3. Any associated symptoms like dizziness, light-headed, or falls? No other symptoms experienced.   4. Are you taking any new medications?  Hydralazine 2 in the morning, 1 in the afternoon and 1 at bedtime.  Imdur twice daily in the morning.

## 2017-03-05 NOTE — Telephone Encounter (Signed)
Shannon [name] wanted to know which BP medication to Hold the Imdur or the Hydralazine or both. And also wanted to know what if the dystolic is over 856 but the systolic is under 60 or vice versa does she still hold the medication when 1 of the BP numbers is low. Please Advise.

## 2017-03-05 NOTE — Telephone Encounter (Signed)
BP improved - start hold parameter; hold BP med if BP < 100/60. She will need to check BP prior to giving medications. Record BP and call office in 1 week with readings.

## 2017-03-05 NOTE — Telephone Encounter (Signed)
LMOM to return call.

## 2017-03-09 ENCOUNTER — Encounter: Payer: Self-pay | Admitting: Nurse Practitioner

## 2017-03-09 ENCOUNTER — Ambulatory Visit (INDEPENDENT_AMBULATORY_CARE_PROVIDER_SITE_OTHER): Payer: Medicare Other | Admitting: Nurse Practitioner

## 2017-03-09 VITALS — BP 136/72 | HR 71 | Temp 98.5°F | Resp 17 | Ht 65.0 in | Wt 158.2 lb

## 2017-03-09 DIAGNOSIS — I480 Paroxysmal atrial fibrillation: Secondary | ICD-10-CM

## 2017-03-09 DIAGNOSIS — R300 Dysuria: Secondary | ICD-10-CM | POA: Diagnosis not present

## 2017-03-09 DIAGNOSIS — I70262 Atherosclerosis of native arteries of extremities with gangrene, left leg: Secondary | ICD-10-CM

## 2017-03-09 LAB — POCT INR: INR: 5.7

## 2017-03-09 LAB — POCT URINALYSIS DIPSTICK
Bilirubin, UA: NEGATIVE
Glucose, UA: NEGATIVE
Ketones, UA: NEGATIVE
Nitrite, UA: NEGATIVE
Spec Grav, UA: 1.01 (ref 1.010–1.025)
Urobilinogen, UA: 0.2 E.U./dL
pH, UA: 7.5 (ref 5.0–8.0)

## 2017-03-09 MED ORDER — CEPHALEXIN 500 MG PO CAPS: 500.0000 mg | ORAL_CAPSULE | Freq: Two times a day (BID) | ORAL | 0 refills | Status: DC

## 2017-03-09 NOTE — Patient Instructions (Addendum)
We will sent urine off for culture but in the meantime will start Keflex 500 mg by mouth twice daily for UTI To increase water intake at this time.   INR 5.7-- HOLD COUMADIN until Tuesday when you go to the coumadin clinic

## 2017-03-09 NOTE — Progress Notes (Signed)
Careteam: Patient Care Team: Gildardo Cranker, DO as PCP - General (Internal Medicine) Edrick Kins, DPM as Consulting Physician (Podiatry) Dorothy Spark, MD as Consulting Physician (Cardiology)  Advanced Directive information Does Patient Have a Medical Advance Directive?: Yes, Type of Advance Directive: Healthcare Power of Attorney  Allergies  Allergen Reactions  . Penicillins Itching, Rash and Other (See Comments)    Tolerated unasyn, zosyn & cephalosporins in the past.  Has patient had a PCN reaction causing immediate rash, facial/tongue/throat swelling, SOB or lightheadedness with hypotension: Yes Has patient had a PCN reaction causing severe rash involving mucus membranes or skin necrosis: Unk Has patient had a PCN reaction that required hospitalization: Unk Has patient had a PCN reaction occurring within the last 10 years: Unk If all of the above answers are "NO", then may proceed with Cephalosporins  . Other     Patient tolerates amoxicillin and cephalosporins    Chief Complaint  Patient presents with  . Acute Visit    Pt is being seen for urinary frequency and burning x 1 week.      HPI: Patient is a 83 y.o. female seen in the office today due to increase burning and frequency for 1 week. Reports she is really uncomfortable when she urinates and getting worse. Increase frequency but this has been ongoing due to diuretic.  Denies incontinence of bowel or bladder.  No recent antibiotics No fevers or chills.  Reports on right sided abdominal discomfort, associated with feeding tube. No suprapubic or back pain.   Review of Systems:  Review of Systems  Constitutional: Negative for chills, fever and malaise/fatigue.  Gastrointestinal: Positive for abdominal pain. Negative for constipation, diarrhea, nausea and vomiting.  Genitourinary: Positive for dysuria, frequency and urgency. Negative for flank pain.  Musculoskeletal: Positive for back pain (lower ).    Neurological: Positive for weakness.    Past Medical History:  Diagnosis Date  . Acute respiratory failure ([place])   . Anemia    previous blood transfusions  . Arthritis    "all over"  . Asthma   . Bradycardia    requiring previous d/c of BB and reduction of amiodarone  . CAD (coronary artery disease)    nonobstructive per notes  . Chronic diastolic CHF (congestive heart failure) ([place])   . CKD (chronic kidney disease), stage III ([place])   . Complication of blood transfusion    "got the wrong blood type at [facility] in ~ 2015; no adverse reaction that we are aware of"/daughter, [name] (01/27/2016)  . COPD (chronic obstructive pulmonary disease) ([place])   . Depression    "light case"  . DVT (deep venous thrombosis) ([place]) 01/2016   a. LLE DVT 01/2016 - switched from Eliquis to Coumadin.  . Gastric stenosis    a. s/p stomach tube  . GERD (gastroesophageal reflux disease)   . History of blood transfusion    "several" (01/27/2016)  . History of stomach ulcers   . Hyperlipidemia   . Hypertension   . Hypothyroidism   . Paraesophageal hernia   . Perforated gastric ulcer ([place])   . Seasonal allergies   . SIADH (syndrome of inappropriate ADH production) ([place])    [name] 01/10/2015  . Small bowel obstruction ([place])    "I don't know how many" (01/11/2015)  . Stroke ([place])    "light one"  . Type II diabetes mellitus ([place])    "related to prednisone use  for > 20 yr; once predinose stopped; no more DM  RX" (01/27/2016)  . UTI (urinary tract infection) 02/08/2016  . Ventral hernia with bowel obstruction    Past Surgical History:  Procedure Laterality Date  . ABDOMINAL AORTOGRAM N/A 09/21/2016   Procedure: Abdominal Aortogram;  Surgeon: Waynetta Sandy, MD;  Location: [facility];  Service: Cardiovascular;  Laterality: N/A;  . AMPUTATION Left 09/25/2016   Procedure: AMPUTATION DIGIT- LEFT 2ND AND 3RD TOES;  Surgeon: Rosetta Posner, MD;  Location: [facility];  Service: Vascular;   Laterality: Left;  . CATARACT EXTRACTION W/ INTRAOCULAR LENS  IMPLANT, BILATERAL    . CHOLECYSTECTOMY OPEN    . COLECTOMY    . ESOPHAGOGASTRODUODENOSCOPY N/A 01/19/2014   Procedure: ESOPHAGOGASTRODUODENOSCOPY (EGD);  Surgeon: Irene Shipper, MD;  Location: [facility];  Service: Endoscopy;  Laterality: N/A;  . ESOPHAGOGASTRODUODENOSCOPY N/A 01/20/2014   Procedure: ESOPHAGOGASTRODUODENOSCOPY (EGD);  Surgeon: Irene Shipper, MD;  Location: [facility];  Service: Endoscopy;  Laterality: N/A;  . ESOPHAGOGASTRODUODENOSCOPY N/A 03/19/2014   Procedure: ESOPHAGOGASTRODUODENOSCOPY (EGD);  Surgeon: Milus Banister, MD;  Location: [facility];  Service: Endoscopy;  Laterality: N/A;  . ESOPHAGOGASTRODUODENOSCOPY N/A 07/08/2015   Procedure: ESOPHAGOGASTRODUODENOSCOPY (EGD);  Surgeon: Doran Stabler, MD;  Location: [facility];  Service: Endoscopy;  Laterality: N/A;  . ESOPHAGOGASTRODUODENOSCOPY (EGD) WITH PROPOFOL N/A 09/15/2015   Procedure: ESOPHAGOGASTRODUODENOSCOPY (EGD) WITH PROPOFOL;  Surgeon: Manus Gunning, MD;  Location: [facility];  Service: Gastroenterology;  Laterality: N/A;  . GASTROJEJUNOSTOMY     hx/notes 01/10/2015  . GASTROJEJUNOSTOMY N/A 09/23/2015   Procedure: OPEN GASTROJEJUNOSTOMY TUBE PLACEMENT;  Surgeon: Arta Bruce Kinsinger, MD;  Location: [facility];  Service: General;  Laterality: N/A;  . GLAUCOMA SURGERY Bilateral   . HERNIA REPAIR  2015  . IR CM INJ ANY COLONIC TUBE W/FLUORO  01/05/2017  . IR GENERIC HISTORICAL  01/07/2016   IR GJ TUBE CHANGE 01/07/2016 Jacqulynn Cadet, MD [facility]  . IR GENERIC HISTORICAL  01/27/2016   IR MECH REMOV OBSTRUC MAT ANY COLON TUBE W/FLUORO 01/27/2016 Markus Daft, MD [facility]  . IR GENERIC HISTORICAL  02/07/2016   IR PATIENT EVAL TECH 0-60 MINS Darrell K Allred, PA-C [facility]  . IR GENERIC HISTORICAL  02/08/2016   IR GJ TUBE CHANGE 02/08/2016 Greggory Keen, MD [facility]  . IR GENERIC HISTORICAL  01/06/2016   IR GJ TUBE CHANGE  01/06/2016 [facility] OUT REF  . IR GENERIC HISTORICAL  05/02/2016   IR CM INJ ANY COLONIC TUBE W/FLUORO 05/02/2016 Arne Cleveland, MD [facility]  . IR GENERIC HISTORICAL  05/15/2016   IR GJ TUBE CHANGE 05/15/2016 Sandi Mariscal, MD [facility]  . IR GENERIC HISTORICAL  06/28/2016   IR GJ TUBE CHANGE 06/28/2016 [facility]  . IR GJ TUBE CHANGE  02/20/2017  . IR PATIENT EVAL TECH 0-60 MINS  10/19/2016  . IR PATIENT EVAL TECH 0-60 MINS  12/25/2016  . IR PATIENT EVAL TECH 0-60 MINS  01/29/2017  . IR REPLC DUODEN/JEJUNO TUBE PERCUT W/FLUORO  11/14/2016  . LAPAROTOMY N/A 01/20/2015   Procedure: EXPLORATORY LAPAROTOMY;  Surgeon: Coralie Keens, MD;  Location: [facility];  Service: General;  Laterality: N/A;  . LOWER EXTREMITY ANGIOGRAPHY Left 09/21/2016   Procedure: Lower Extremity Angiography;  Surgeon: Waynetta Sandy, MD;  Location: [facility];  Service: Cardiovascular;  Laterality: Left;  . LYSIS OF ADHESION N/A 01/20/2015   Procedure: LYSIS OF ADHESIONS < 1 HOUR;  Surgeon: Coralie Keens, MD;  Location: [facility];  Service: General;  Laterality: N/A;  .  PERIPHERAL VASCULAR BALLOON ANGIOPLASTY Left 09/21/2016   Procedure: Peripheral Vascular Balloon Angioplasty;  Surgeon: Waynetta Sandy, MD;  Location: [facility];  Service: Cardiovascular;  Laterality: Left;  drug coated balloon  . TONSILLECTOMY    . TUBAL LIGATION    . VENTRAL HERNIA REPAIR  2015   incarcerated ventral hernia ([facility] 09/2013)/notes 01/10/2015   Social History:   reports that  has never smoked. She has quit using smokeless tobacco. Her smokeless tobacco use included snuff. She reports that she does not drink alcohol or use drugs.  Family History  Problem Relation Age of Onset  . Stroke Mother   . Hypertension Mother   . Diabetes Brother   . Glaucoma Son   . Glaucoma Son   . Heart attack Neg Hx     Medications:   Medication List        Accurate as of 03/09/17 12:00 PM. Always use your most recent  med list.          acetaminophen 500 MG tablet Commonly known as:  TYLENOL   AMINO ACIDS-PROTEIN HYDROLYS PO   atorvastatin 10 MG tablet Commonly known as:  LIPITOR TAKE ONE TABLET BY MOUTH ONCE DAILY   b complex vitamins tablet   calcium-vitamin D 500-200 MG-UNIT tablet Commonly known as:  OSCAL WITH D   carvedilol 6.25 MG tablet Commonly known as:  COREG Take 2 tabs po in AM and 1 tab po in PM for blood pressure   cycloSPORINE 0.05 % ophthalmic emulsion Commonly known as:  RESTASIS USE 1 DROP INTO BOTH EYES TWICE DAILY   feeding supplement Liqd   ferrous sulfate 325 (65 FE) MG tablet TAKE 1 TABLET BY MOUTH EVERY DAY WITH BREAKFAST   fluticasone 50 MCG/ACT nasal spray Commonly known as:  FLONASE Place 2 sprays into both nostrils daily.   free water Soln   furosemide 40 MG tablet Commonly known as:  LASIX Take 1 tablet (40 mg total) by mouth daily.   gabapentin 300 MG capsule Commonly known as:  NEURONTIN TAKE 2 CAPSULES (600 MG TOTAL) BY MOUTH 3 (THREE) TIMES DAILY.   hydrALAZINE 25 MG tablet Commonly known as:  APRESOLINE Take 2 tablets in the mornings, 1 tablet in the afternoons, and 1 tablet in the evenings   insulin aspart 100 UNIT/ML FlexPen Commonly known as:  NOVOLOG FLEXPEN Take three times daily before each meal based on sliding scale 140-199( 2 units), 200-250 (4 units), 251-299 (6 units), 300-349 (8 units), > 350(10 units)   Insulin Glargine 100 UNIT/ML Solostar Pen Commonly known as:  LANTUS Injection 0.1 ml (10 units) once daily to control blood sugar. Dx E11.9   Insulin Pen Needle 31G X 5 MM Misc Commonly known as:  B-D UF III MINI PEN NEEDLES Use as Directed in giving insulin. Dx E11.9   ipratropium-albuterol 0.5-2.5 (3) MG/3ML Soln Commonly known as:  DUONEB Take 3 mLs by nebulization every 6 (six) hours as needed (sob). Use 3 times daily x 4 days then every 6 hours as needed.   isosorbide mononitrate 30 MG 24 hr tablet Commonly  known as:  IMDUR Take 2 tablets (60 mg total) by mouth daily.   latanoprost 0.005 % ophthalmic solution Commonly known as:  XALATAN Place 1 drop into both eyes at bedtime.   levalbuterol 45 MCG/ACT inhaler Commonly known as:  XOPENEX HFA Inhale 2 puffs into the lungs every 6 (six) hours as needed for wheezing or shortness of breath.   levothyroxine 137  MCG tablet Commonly known as:  SYNTHROID, LEVOTHROID TAKE 1 TABLET (137 MCG TOTAL) BY MOUTH DAILY BEFORE BREAKFAST.   mometasone-formoterol 200-5 MCG/ACT Aero Commonly known as:  DULERA Inhale 2 puffs into the lungs 2 (two) times daily.   multivitamin with minerals Tabs tablet   nystatin 100000 UNIT/ML suspension Commonly known as:  MYCOSTATIN 5 ml swish and spit 4 times daily for 1 week then as needed until resolves   nystatin powder Commonly known as:  MYCOSTATIN/NYSTOP Apply topically 3 (three) times daily.   oxyCODONE 5 MG immediate release tablet Commonly known as:  ROXICODONE Take 1 tablet (5 mg total) by mouth every 4 (four) hours as needed for severe pain. May take 1/2 tab q4hrs as needed for mild pain.   OXYGEN   pantoprazole 40 MG tablet Commonly known as:  PROTONIX Take 1 tablet (40 mg total) 2 (two) times daily by mouth.   Polyethyl Glycol-Propyl Glycol 0.4-0.3 % Soln   RESOURCE THICKENUP CLEAR Powd Take 120 g by mouth as needed (use to thicken liquids.).   sertraline 100 MG tablet Commonly known as:  ZOLOFT TAKE ONE TABLET BY MOUTH ONCE DAILY FOR DEPRESSION   SYSTANE NIGHTTIME Oint   warfarin 2.5 MG tablet Commonly known as:  COUMADIN Take as directed by the anticoagulation clinic. If you are unsure how to take this medication, talk to your nurse or doctor. Original instructions:  TAKE AS DIRECTED BY COUMADIN CLINIC.        Physical Exam:  Vitals:   03/09/17 1133  BP: 136/72  Pulse: 71  Resp: 17  Temp: 98.5 F (36.9 C)  TempSrc: Oral  SpO2: 95%  Weight: 158 lb 3.2 oz (71.8 kg)  Height:  5\' 5"  (1.651 m)   Body mass index is 26.33 kg/m.  Physical Exam  Constitutional: She is oriented to person, place, and time. She appears well-developed.  Frail appearing in NAD  HENT:  Mouth/Throat: Oropharynx is clear and moist. No oropharyngeal exudate.  MMM; no oral thrush  Eyes: Pupils are equal, round, and reactive to light.  Neck: Carotid bruit is not present.  Cardiovascular: Normal rate and regular rhythm. Exam reveals no gallop and no friction rub.  Murmur (1/6 SEM) heard. Pulmonary/Chest: Effort normal and breath sounds normal.  Abdominal: Soft. Normal appearance and bowel sounds are normal. She exhibits no mass. There is no hepatomegaly. There is tenderness. There is no rigidity, no rebound and no guarding. No hernia.  Ventral hernia, reducible; Peg tube intact with min d/c but no foul odor  Musculoskeletal: She exhibits no deformity.  Neurological: She is alert and oriented to person, place, and time. She has normal reflexes.  Antalgic gait  Skin: Skin is warm and dry. No rash noted.  Psychiatric: She has a normal mood and affect. Her behavior is normal. Judgment and thought content normal.    Labs reviewed: Basic Metabolic Panel: Recent Labs    06/08/16 1523  07/21/16 1124  08/16/16 0616  08/18/16 0517  09/25/16 0524  02/21/17 0440 02/22/17 0436 02/23/17 0330  NA 135   < >  --    < > 141   < > 135   < > 136   < > 144 141 137  K 4.4   < >  --    < > 4.2   < > 4.0   < > 4.6   < > 4.4 4.2 4.1  CL 99*   < >  --    < > 107   < >  101   < > 105   < > 114* 114* 111  CO2 29   < >  --    < > 25   < > 24   < > 25   < > 26 22 22   GLUCOSE 148*   < >  --    < > 350*   < > 154*   < > 106*   < > 138* 146* 142*  BUN 54*   < >  --    < > 54*   < > 49*   < > 32*   < > 53* 32* 24*  CREATININE 1.97*   < >  --    < > 1.04*   < > 0.96   < > 1.03*   < > 1.30* 1.03* 0.99  CALCIUM 8.7*   < >  --    < > 9.4   < > 9.1   < > 8.7*   < > 9.0 8.9 9.0  MG 1.9   < >  --    < > 1.9  --  1.9   --   --   --   --  1.9 1.6*  PHOS  --    < >  --    < > 2.2*  --   --   --  3.5  --   --   --  2.1*  TSH 10.017*  --  4.97*  --   --   --  0.670  --   --   --   --   --   --    < > = values in this interval not displayed.   Liver Function Tests: Recent Labs    09/26/16 0435 10/03/16 1610 11/02/16 1710  AST 25 20 24   ALT 31 25 19   ALKPHOS 57 73 58  BILITOT 0.4 0.4 0.5  PROT 5.4* 7.1 7.0  ALBUMIN 2.0* 3.2* 3.5   No results for input(s): LIPASE, AMYLASE in the last 8760 hours. No results for input(s): AMMONIA in the last 8760 hours. CBC: Recent Labs    02/21/17 0440 02/22/17 0436 02/23/17 0330  WBC 6.4 6.3 7.4  NEUTROABS 3.3 3.3 4.4  HGB 8.8* 8.9* 9.3*  HCT 27.0* 26.9* 27.8*  MCV 94.1 93.4 91.7  PLT 233 238 250   Lipid Panel: No results for input(s): CHOL, HDL, LDLCALC, TRIG, CHOLHDL, LDLDIRECT in the last 8760 hours. TSH: Recent Labs    06/08/16 1523 07/21/16 1124 08/18/16 0517  TSH 10.017* 4.97* 0.670   A1C: Lab Results  Component Value Date   HGBA1C 7.7 (H) 08/16/2016     Assessment/Plan 1. Dysuria - POC Urinalysis Dipstick- abnormal. - Culture, Urine; Future - Culture, Urine - cephALEXin (KEFLEX) 500 MG capsule; Take 1 capsule (500 mg total) by mouth 2 (two) times daily.  Dispense: 14 capsule; Refill: 0 -to increase hydration.   2. Paroxysmal atrial fibrillation (HCC) Rate controlled, on coumadin for anticoagulation due to starting antibiotic for UTI INR checked - POC INR- 5.7! Will hold coumadin until Tuesday  12/11 when she goes to coumadin clinic   Call and discussed with daughter who was not at apt today Kalya Troeger K. Harle Battiest  [facility] [phone] 8 am - 5 pm) [phone] (after hours)

## 2017-03-11 LAB — URINE CULTURE
MICRO NUMBER:: 81379448
SPECIMEN QUALITY:: ADEQUATE

## 2017-03-13 ENCOUNTER — Other Ambulatory Visit: Payer: Self-pay | Admitting: Nurse Practitioner

## 2017-03-13 MED ORDER — NITROFURANTOIN MONOHYD MACRO 100 MG PO CAPS: 100.0000 mg | ORAL_CAPSULE | Freq: Two times a day (BID) | ORAL | 0 refills | Status: DC

## 2017-03-14 ENCOUNTER — Telehealth: Payer: Self-pay | Admitting: *Deleted

## 2017-03-14 NOTE — Telephone Encounter (Signed)
Soledad Gerlach, pt's dtr, called to inform us that she was taking Cephalexin 500mg  BID but her PCP changed her to Nitrofurantin 100mg  BID for 7 days & started on 03/13/17. Advised that the meds are safe to take with Coumadin. Also, pt needed an appt & scheduled an appt per dtr request for tomorrow.

## 2017-03-15 ENCOUNTER — Ambulatory Visit (INDEPENDENT_AMBULATORY_CARE_PROVIDER_SITE_OTHER): Payer: Medicare Other | Admitting: Pharmacist

## 2017-03-15 DIAGNOSIS — I4892 Unspecified atrial flutter: Secondary | ICD-10-CM | POA: Diagnosis not present

## 2017-03-15 DIAGNOSIS — Z5181 Encounter for therapeutic drug level monitoring: Secondary | ICD-10-CM

## 2017-03-15 LAB — POCT INR: INR: 1.1

## 2017-03-15 NOTE — Patient Instructions (Signed)
Description   Take 3 tablets today, 4 tablets tomorrow, and 3 tablets Saturday, then continue taking 2 tablets daily except 3 tablets on Mondays, Wednesdays, and Fridays.  Recheck INR in 1 week. Call us with any medication changes or concerns [phone]. Main # [phone].

## 2017-03-21 ENCOUNTER — Other Ambulatory Visit: Payer: Self-pay | Admitting: Internal Medicine

## 2017-03-23 ENCOUNTER — Ambulatory Visit (INDEPENDENT_AMBULATORY_CARE_PROVIDER_SITE_OTHER): Payer: Medicare Other | Admitting: *Deleted

## 2017-03-23 DIAGNOSIS — I4892 Unspecified atrial flutter: Secondary | ICD-10-CM | POA: Diagnosis not present

## 2017-03-23 DIAGNOSIS — Z5181 Encounter for therapeutic drug level monitoring: Secondary | ICD-10-CM | POA: Diagnosis not present

## 2017-03-23 LAB — POCT INR: INR: 3

## 2017-04-02 ENCOUNTER — Ambulatory Visit (INDEPENDENT_AMBULATORY_CARE_PROVIDER_SITE_OTHER): Payer: Medicare Other

## 2017-04-02 ENCOUNTER — Encounter: Payer: Self-pay | Admitting: Podiatry

## 2017-04-02 ENCOUNTER — Ambulatory Visit (INDEPENDENT_AMBULATORY_CARE_PROVIDER_SITE_OTHER): Payer: Medicare Other | Admitting: Podiatry

## 2017-04-02 DIAGNOSIS — L97519 Non-pressure chronic ulcer of other part of right foot with unspecified severity: Secondary | ICD-10-CM | POA: Diagnosis not present

## 2017-04-02 DIAGNOSIS — L97512 Non-pressure chronic ulcer of other part of right foot with fat layer exposed: Secondary | ICD-10-CM | POA: Diagnosis not present

## 2017-04-02 DIAGNOSIS — I83015 Varicose veins of right lower extremity with ulcer other part of foot: Secondary | ICD-10-CM | POA: Diagnosis not present

## 2017-04-02 MED ORDER — GENTAMICIN SULFATE 0.1 % EX CREA: 1.0000 "application " | TOPICAL_CREAM | Freq: Three times a day (TID) | CUTANEOUS | 1 refills | Status: DC

## 2017-04-04 ENCOUNTER — Other Ambulatory Visit (HOSPITAL_COMMUNITY): Payer: Self-pay | Admitting: Interventional Radiology

## 2017-04-04 ENCOUNTER — Encounter (HOSPITAL_COMMUNITY): Payer: Self-pay | Admitting: Interventional Radiology

## 2017-04-04 ENCOUNTER — Ambulatory Visit (HOSPITAL_COMMUNITY)
Admission: RE | Admit: 2017-04-04 | Discharge: 2017-04-04 | Disposition: A | Discharge status: Home / Self Care | Payer: Medicare Other | Source: Ambulatory Visit | Attending: Interventional Radiology | Admitting: Interventional Radiology

## 2017-04-04 DIAGNOSIS — R633 Feeding difficulties, unspecified: Secondary | ICD-10-CM

## 2017-04-04 HISTORY — PX: IR PATIENT EVAL TECH 0-60 MINS: IMG5564

## 2017-04-04 NOTE — Procedures (Signed)
Patient and her daughter came in today with complaint of clogged j port in [place] tube .  I was able to unclog the tube with a 10 cc syringe using constant even pressure injection.  They were satisfied with the outcome and will call if she has continued problems.  She is very interested in any other GJ alternative we can give.  I told her we would look into it.

## 2017-04-06 ENCOUNTER — Ambulatory Visit (INDEPENDENT_AMBULATORY_CARE_PROVIDER_SITE_OTHER): Payer: Medicare Other | Admitting: *Deleted

## 2017-04-06 DIAGNOSIS — Z5181 Encounter for therapeutic drug level monitoring: Secondary | ICD-10-CM

## 2017-04-06 DIAGNOSIS — I4892 Unspecified atrial flutter: Secondary | ICD-10-CM

## 2017-04-06 LAB — POCT INR: INR: 4.7

## 2017-04-06 NOTE — Patient Instructions (Signed)
Description   Skip today's dose, tomorrow only take 1 tablet, then Continue taking 2 tablets daily except 3 tablets on Mondays, Wednesdays, and Fridays.  Recheck INR in 10 days.   Call us with any medication changes or concerns [phone]. Main # [phone].

## 2017-04-07 NOTE — Progress Notes (Signed)
Subjective:  84 year old female with past medical history of T2DM presents today for evaluation of an ulceration to the right great toe that appeared approximately 1 month ago. Patient relates pain to the ulceration site.  She states she has been applying iodine and antibiotic cream to the area. Patient presents today for further treatment and evaluation of the ulceration site.    Past Medical History:  Diagnosis Date  . Acute respiratory failure ([place])   . Anemia    previous blood transfusions  . Arthritis    "all over"  . Asthma   . Bradycardia    requiring previous d/c of BB and reduction of amiodarone  . CAD (coronary artery disease)    nonobstructive per notes  . Chronic diastolic CHF (congestive heart failure) ([place])   . CKD (chronic kidney disease), stage III ([place])   . Complication of blood transfusion    "got the wrong blood type at [facility] in ~ 2015; no adverse reaction that we are aware of"/daughter, [name] (01/27/2016)  . COPD (chronic obstructive pulmonary disease) ([place])   . Depression    "light case"  . DVT (deep venous thrombosis) ([place]) 01/2016   a. LLE DVT 01/2016 - switched from Eliquis to Coumadin.  . Gastric stenosis    a. s/p stomach tube  . GERD (gastroesophageal reflux disease)   . History of blood transfusion    "several" (01/27/2016)  . History of stomach ulcers   . Hyperlipidemia   . Hypertension   . Hypothyroidism   . Paraesophageal hernia   . Perforated gastric ulcer ([place])   . Seasonal allergies   . SIADH (syndrome of inappropriate ADH production) ([place])    [name] 01/10/2015  . Small bowel obstruction ([place])    "I don't know how many" (01/11/2015)  . Stroke ([place])    "light one"  . Type II diabetes mellitus ([place])    "related to prednisone use  for > 20 yr; once predinose stopped; no more DM RX" (01/27/2016)  . UTI (urinary tract infection) 02/08/2016  . Ventral hernia with bowel obstruction      Objective/Physical Exam General: The patient is  alert and oriented x3 in no acute distress.  Dermatology:  Wound #1 noted to the right great toe measuring approximately 1.1 x 1.1 x 0.1 cm (LxWxD).   To the noted ulceration(s), there is no eschar. There is a moderate amount of slough, fibrin, and necrotic tissue noted. Granulation tissue and wound base is red. There is a minimal amount of serosanguineous drainage noted. There is no exposed bone muscle-tendon ligament or joint. There is no malodor. Periwound integrity is intact. Skin is warm, dry and supple bilateral lower extremities.  Vascular: Palpable pedal pulses bilaterally. Mild edema noted. Capillary refill within normal limits. Varicosities noted bilateral lower extremities.   Neurological: Epicritic and protective threshold absent bilaterally.   Musculoskeletal Exam: Range of motion within normal limits to all pedal and ankle joints bilateral. Muscle strength 5/5 in all groups bilateral.   Radiographic Exam:  Normal osseous mineralization. Joint spaces preserved. No fracture/dislocation/boney destruction.    Assessment: #1 Ulceration of the right great toe secondary to venous insufficiency #2 varicosities bilateral lower extremities  Plan of Care:  #1 Patient was evaluated.  X-rays reviewed. #2 medically necessary excisional debridement including subcutaneous tissue was performed using a tissue nipper and a chisel blade. Excisional debridement of all the necrotic nonviable tissue down to healthy bleeding viable tissue was performed with post-debridement measurements same as pre-. #3  the wound was cleansed with normal saline. #4  Prescription for gentamicin cream to be used daily with a Band-Aid provided to patient. #5 patient is to return to clinic in 3 weeks.   Edrick Kins, DPM [facility]  Dr. Edrick Kins, [place]                                        [place], [place] [address]                Office [phone]  Fax [phone]

## 2017-04-11 ENCOUNTER — Ambulatory Visit: Payer: Medicare Other | Admitting: Podiatry

## 2017-04-13 ENCOUNTER — Other Ambulatory Visit: Payer: Self-pay | Admitting: Cardiology

## 2017-04-19 ENCOUNTER — Ambulatory Visit (INDEPENDENT_AMBULATORY_CARE_PROVIDER_SITE_OTHER): Payer: Medicare Other | Admitting: *Deleted

## 2017-04-19 ENCOUNTER — Other Ambulatory Visit: Payer: Self-pay | Admitting: *Deleted

## 2017-04-19 DIAGNOSIS — Z8673 Personal history of transient ischemic attack (TIA), and cerebral infarction without residual deficits: Secondary | ICD-10-CM | POA: Diagnosis not present

## 2017-04-19 DIAGNOSIS — I82542 Chronic embolism and thrombosis of left tibial vein: Secondary | ICD-10-CM | POA: Diagnosis not present

## 2017-04-19 DIAGNOSIS — I4892 Unspecified atrial flutter: Secondary | ICD-10-CM | POA: Diagnosis not present

## 2017-04-19 DIAGNOSIS — Z5181 Encounter for therapeutic drug level monitoring: Secondary | ICD-10-CM | POA: Diagnosis not present

## 2017-04-19 DIAGNOSIS — I481 Persistent atrial fibrillation: Secondary | ICD-10-CM

## 2017-04-19 DIAGNOSIS — I4819 Other persistent atrial fibrillation: Secondary | ICD-10-CM

## 2017-04-19 LAB — POCT INR: INR: 4.5

## 2017-04-19 MED ORDER — FLUTICASONE PROPIONATE 50 MCG/ACT NA SUSP: 2.0000 | Freq: Every day | NASAL | 3 refills | Status: DC

## 2017-04-19 NOTE — Patient Instructions (Signed)
Description   Do not take coumadin today Jan 17th and no coumadin Jan 18th then change coumadin dose to  2 tablets daily except 3 tablets on Mondays and Fridays.  Recheck INR in 10 days.   Call us with any medication changes or concerns [phone]. Main # [phone].

## 2017-04-19 NOTE — Telephone Encounter (Signed)
Patient daughter, [name] requested refill.

## 2017-04-23 ENCOUNTER — Ambulatory Visit: Payer: Medicare Other | Admitting: Podiatry

## 2017-04-24 DIAGNOSIS — R531 Weakness: Secondary | ICD-10-CM | POA: Diagnosis not present

## 2017-04-25 ENCOUNTER — Other Ambulatory Visit: Payer: Self-pay | Admitting: *Deleted

## 2017-04-25 MED ORDER — INSULIN PEN NEEDLE 31G X 5 MM MISC: 3 refills | Status: DC

## 2017-04-25 NOTE — Telephone Encounter (Signed)
Patient caregiver, [name] requested and stated that patient is using twice daily. Faxed to pharmacy.

## 2017-04-28 ENCOUNTER — Other Ambulatory Visit: Payer: Self-pay | Admitting: Internal Medicine

## 2017-04-30 ENCOUNTER — Encounter: Payer: Self-pay | Admitting: Cardiology

## 2017-04-30 ENCOUNTER — Ambulatory Visit (HOSPITAL_COMMUNITY)
Admission: RE | Admit: 2017-04-30 | Discharge: 2017-04-30 | Disposition: A | Discharge status: Home / Self Care | Payer: Medicare Other | Source: Ambulatory Visit | Attending: Radiology | Admitting: Radiology

## 2017-04-30 ENCOUNTER — Encounter: Payer: Self-pay | Admitting: Podiatry

## 2017-04-30 ENCOUNTER — Ambulatory Visit (INDEPENDENT_AMBULATORY_CARE_PROVIDER_SITE_OTHER): Payer: Medicare Other | Admitting: Cardiology

## 2017-04-30 ENCOUNTER — Ambulatory Visit (INDEPENDENT_AMBULATORY_CARE_PROVIDER_SITE_OTHER): Payer: Medicare Other | Admitting: *Deleted

## 2017-04-30 ENCOUNTER — Other Ambulatory Visit (HOSPITAL_COMMUNITY): Payer: Self-pay | Admitting: Radiology

## 2017-04-30 ENCOUNTER — Ambulatory Visit (INDEPENDENT_AMBULATORY_CARE_PROVIDER_SITE_OTHER): Payer: Medicare Other | Admitting: Podiatry

## 2017-04-30 ENCOUNTER — Other Ambulatory Visit: Payer: Self-pay | Admitting: *Deleted

## 2017-04-30 VITALS — BP 160/62 | HR 66 | Wt 158.6 lb

## 2017-04-30 DIAGNOSIS — L97512 Non-pressure chronic ulcer of other part of right foot with fat layer exposed: Secondary | ICD-10-CM | POA: Diagnosis not present

## 2017-04-30 DIAGNOSIS — R633 Feeding difficulties, unspecified: Secondary | ICD-10-CM

## 2017-04-30 DIAGNOSIS — I119 Hypertensive heart disease without heart failure: Secondary | ICD-10-CM

## 2017-04-30 DIAGNOSIS — K922 Gastrointestinal hemorrhage, unspecified: Secondary | ICD-10-CM

## 2017-04-30 DIAGNOSIS — I4892 Unspecified atrial flutter: Secondary | ICD-10-CM | POA: Diagnosis not present

## 2017-04-30 DIAGNOSIS — K9413 Enterostomy malfunction: Secondary | ICD-10-CM | POA: Insufficient documentation

## 2017-04-30 DIAGNOSIS — I70235 Atherosclerosis of native arteries of right leg with ulceration of other part of foot: Secondary | ICD-10-CM

## 2017-04-30 DIAGNOSIS — I10 Essential (primary) hypertension: Secondary | ICD-10-CM

## 2017-04-30 DIAGNOSIS — Z5181 Encounter for therapeutic drug level monitoring: Secondary | ICD-10-CM

## 2017-04-30 DIAGNOSIS — E0842 Diabetes mellitus due to underlying condition with diabetic polyneuropathy: Secondary | ICD-10-CM

## 2017-04-30 HISTORY — PX: IR PATIENT EVAL TECH 0-60 MINS: IMG5564

## 2017-04-30 LAB — POCT INR: INR: 3.9

## 2017-04-30 MED ORDER — FERROUS SULFATE 325 (65 FE) MG PO TABS: ORAL_TABLET | ORAL | 1 refills | Status: DC

## 2017-04-30 MED ORDER — GENTAMICIN SULFATE 0.1 % EX CREA: 1.0000 "application " | TOPICAL_CREAM | Freq: Three times a day (TID) | CUTANEOUS | 1 refills | Status: DC

## 2017-04-30 MED ORDER — HYDRALAZINE HCL 50 MG PO TABS: 50.0000 mg | ORAL_TABLET | Freq: Three times a day (TID) | ORAL | 3 refills | Status: DC

## 2017-04-30 NOTE — Telephone Encounter (Signed)
[facility] 

## 2017-04-30 NOTE — Procedures (Signed)
Patient came in today with complaint of clogged j tube x 2 days.  I was able to unclog the tube using a stiff amplatz wire.  The catheter was flushed with several syringes of saline.  The patient and her daughter were happy with the outcome.  I explained that we were ordering her a different type of GJ catheter tomorrow and will call them when we receive it.  Hopefully, the new tube will help with the clogging issues she has with  the current tube product.

## 2017-04-30 NOTE — Patient Instructions (Signed)
Medication Instructions:   INCREASE YOUR HYDRALAZINE TO 50 MG THREE TIMES DAILY    Labwork:  A WEEK PRIOR TO YOUR 3 MONTH FOLLOW-UP APPOINTMENT WITH DR NELSON TO CHECK---CMET, TSH, AND CBC W DIFF     Follow-Up:  3 MONTHS WITH DR NELSON--PLEASE HAVE YOUR LABS DONE A WEEK PRIOR TO THIS APPOINTMENT       If you need a refill on your cardiac medications before your next appointment, please call your pharmacy.

## 2017-04-30 NOTE — Progress Notes (Signed)
Patient ID: Shannon [name]                 DOB: 1923-11-05                      MRN: [id number]     HPI: Shannon Smithis a 84 y.o.femalepatient ofDr. Laurine Blazer presents today for HTN f/u.PMH includes stage III CKD, COPD, HLD, HTN, afib, chronic diastolic HF (EF 60-65%). Pt was seen by Dr. Montez Morita in office on 8/3 for hypertension related headaches following an ED visit on 8/2 after a home BP reading of 209/93. Her Lasix was stopped, and she was discharged with metoprolol tartrate 25 mg. At the office visit, BP was 146/72. She was switched to Coreg 6.25 mg BID. Pt's daughter called into clinic on 8/13 reporting pt's BP high in the mornings 190-202/70s-90s and HR 60-70. Pt also complained of left leg swelling and weight gain of 5 lbs in the last week and a half. Lasix 40 mg daily was started by Dr. Delton See.  At her HTN clinic visit on 11/22/16, pt's blood pressure was elevated at 164/78 mmHg. Hydralazine was adjusted to dose 50 mg in the mornings, 25 mg in the afternoon, and 25 mg in the evenings to provide better BP control for higher morning readings.  Patient presents today for HTN clinic in good spirits with her daughter. She denies dizziness, lightheadedness, or recent falls. She continues to take her blood pressure in the morning one hour after she takes her medicines and also takes her blood pressure in the evening. She feels comfortable continuing her medication regimen and states her head feels better since her blood pressure has come down. Her home BP readings have ranged 120-150s/60-70s, HR in the low 70s. She does have occasional high readings with systolics in the 170s. Her readings remain higher in the morning compared to her evening readings. She had 1 high reading of 197/78 however states she was moving around a bit when she took this reading.  01/24/2017 - 3 months follow-up, the patient denies any lower extremity edema chest pain or shortness of breath, no orthopnea paroxysmal nocturnal  dyspnea. She's been experiencing abdominal pain that is constant and worsening with free water which is used for flushing her feeding tube. She denies any bleeding. Her INR has been persistently low. She doesn't eat any solid food, only liquids and then she gets feeding the feeding tube at night.  05/01/2017 - this is 3 months follow-up, the patient states that her blood pressure runs in 150-160 range every morning and gets lower in the afternoon. She is using compression stockings are in her lower extremity edema has improved significantly. She denies any chest pain shortness of breath, no bleeding. She has been experiencing dysuria and no last few days.   Current HTN meds: Carvedilol 6.25mg  2 tablets (12.5mg )in the mornings and 1 tablet (6.25mg )in the evenings (10 AM and 6 PM) Lasix 40mg  daily (10 AM) Hydralazine 50 mg in the mornings, 25 mg in the afternoon,and 25 mg in evenings (10 AM, 6 PM, 9 PM) Imdur 30 mg daily (10 AM)  Previously tried:Losartan 100 mg daily, lisinopril 10 mg daily, amlodipine 10 mg daily - swelling  BP goal: <150/90 mmHg  Family History: Mother - Stroke, HTN. Brother - Diabetes.   Social History: Reports that she has never smoked. Has quit using smokeless tobacco. Denies alcohol or illicit drug use.   Diet:Liquid diet, feeding tube. No coffee. Has diet ginger ale, some diet  Coke.   Exercise:Light walking a few times a week, varies.    Wt Readings from Last 3 Encounters:  04/30/17 158 lb 9.6 oz (71.9 kg)  03/09/17 158 lb 3.2 oz (71.8 kg)  02/28/17 155 lb 12.8 oz (70.7 kg)   BP Readings from Last 3 Encounters:  04/30/17 (!) 160/62  03/09/17 136/72  02/28/17 (!) 176/80   Pulse Readings from Last 3 Encounters:  04/30/17 66  03/09/17 71  02/28/17 69    Renal function: CrCl cannot be calculated (Patient's most recent lab result is older than the maximum 21 days allowed.).  Past Medical History:  Diagnosis Date  . Acute respiratory failure  (HCC)   . Anemia    previous blood transfusions  . Arthritis    "all over"  . Asthma   . Bradycardia    requiring previous d/c of BB and reduction of amiodarone  . CAD (coronary artery disease)    nonobstructive per notes  . Chronic diastolic CHF (congestive heart failure) (HCC)   . CKD (chronic kidney disease), stage III (HCC)   . Complication of blood transfusion    "got the wrong blood type at [facility] in ~ 2015; no adverse reaction that we are aware of"/daughter, [name] (01/27/2016)  . COPD (chronic obstructive pulmonary disease) (HCC)   . Depression    "light case"  . DVT (deep venous thrombosis) (HCC) 01/2016   a. LLE DVT 01/2016 - switched from Eliquis to Coumadin.  . Gastric stenosis    a. s/p stomach tube  . GERD (gastroesophageal reflux disease)   . History of blood transfusion    "several" (01/27/2016)  . History of stomach ulcers   . Hyperlipidemia   . Hypertension   . Hypothyroidism   . Paraesophageal hernia   . Perforated gastric ulcer (HCC)   . Seasonal allergies   . SIADH (syndrome of inappropriate ADH production) (HCC)    [name] 01/10/2015  . Small bowel obstruction (HCC)    "I don't know how many" (01/11/2015)  . Stroke (HCC)    "light one"  . Type II diabetes mellitus (HCC)    "related to prednisone use  for > 20 yr; once predinose stopped; no more DM RX" (01/27/2016)  . UTI (urinary tract infection) 02/08/2016  . Ventral hernia with bowel obstruction     Current Outpatient Medications on File Prior to Visit  Medication Sig Dispense Refill  . acetaminophen (TYLENOL) 500 MG tablet Take 1,000 mg by mouth every 6 (six) hours as needed (pain).    . AMINO ACIDS-PROTEIN HYDROLYS PO Take 30 mLs by mouth 2 (two) times daily.    [name] atorvastatin (LIPITOR) 10 MG tablet TAKE ONE TABLET BY MOUTH ONCE DAILY 90 tablet 1  . b complex vitamins tablet Take 1 tablet by mouth daily.     . calcium-vitamin D (OSCAL WITH D) 500-200 MG-UNIT per tablet Take 1 tablet by mouth  daily with breakfast.     . carvedilol (COREG) 6.25 MG tablet Take 2 tabs po in AM and 1 tab po in PM for blood pressure 90 tablet 6  . cycloSPORINE (RESTASIS) 0.05 % ophthalmic emulsion USE 1 DROP INTO BOTH EYES TWICE DAILY 60 mL 3  . feeding supplement (OSMOLITE 1 CAL) LIQD Take 711 mLs by mouth at bedtime.     . ferrous sulfate 325 (65 FE) MG tablet TAKE 1 TABLET BY MOUTH EVERY DAY WITH BREAKFAST 30 tablet 6  . fluticasone (FLONASE) 50 MCG/ACT nasal spray Place 2 sprays  into both nostrils daily. 16 g 3  . furosemide (LASIX) 40 MG tablet Take 1 tablet (40 mg total) by mouth daily. 30 tablet 11  . gabapentin (NEURONTIN) 300 MG capsule TAKE 2 CAPSULES (600 MG TOTAL) BY MOUTH 3 (THREE) TIMES DAILY. 180 capsule 1  . gentamicin cream (GARAMYCIN) 0.1 % Apply 1 application topically 3 (three) times daily. 30 g 1  . hydrALAZINE (APRESOLINE) 25 MG tablet Take 2 tablets in the mornings, 1 tablet in the afternoons, and 1 tablet in the evenings 120 tablet 11  . insulin aspart (NOVOLOG FLEXPEN) 100 UNIT/ML FlexPen Take three times daily before each meal based on sliding scale 140-199( 2 units), 200-250 (4 units), 251-299 (6 units), 300-349 (8 units), > 350(10 units) 15 pen 11  . Insulin Glargine (LANTUS) 100 UNIT/ML Solostar Pen Injection 0.1 ml (10 units) once daily to control blood sugar. Dx E11.9 15 mL 5  . Insulin Pen Needle (B-D UF III MINI PEN NEEDLES) 31G X 5 MM MISC Use twice daily with giving insulin injections. Dx E11.9 300 each 3  . ipratropium-albuterol (DUONEB) 0.5-2.5 (3) MG/3ML SOLN Take 3 mLs by nebulization every 6 (six) hours as needed (sob). Use 3 times daily x 4 days then every 6 hours as needed. 360 mL 4  . isosorbide mononitrate (IMDUR) 30 MG 24 hr tablet Take 2 tablets (60 mg total) by mouth daily. 180 tablet 2  . latanoprost (XALATAN) 0.005 % ophthalmic solution Place 1 drop into both eyes at bedtime. 2.5 mL 12  . levalbuterol (XOPENEX HFA) 45 MCG/ACT inhaler Inhale 2 puffs into the  lungs every 6 (six) hours as needed for wheezing or shortness of breath. 1 Inhaler 12  . levothyroxine (SYNTHROID, LEVOTHROID) 137 MCG tablet TAKE 1 TABLET (137 MCG TOTAL) BY MOUTH DAILY BEFORE BREAKFAST. 30 tablet 2  . Maltodextrin-Xanthan Gum (RESOURCE THICKENUP CLEAR) POWD Take 120 g by mouth as needed (use to thicken liquids.). 20 Can 0  . mometasone-formoterol (DULERA) 200-5 MCG/ACT AERO Inhale 2 puffs into the lungs 2 (two) times daily. 13 g 6  . Multiple Vitamin (MULTIVITAMIN WITH MINERALS) TABS tablet Take 1 tablet by mouth daily.    . nitrofurantoin, macrocrystal-monohydrate, (MACROBID) 100 MG capsule Take 1 capsule (100 mg total) by mouth 2 (two) times daily. 14 capsule 0  . nystatin (MYCOSTATIN) 100000 UNIT/ML suspension 5 ml swish and spit 4 times daily for 1 week then as needed until resolves 240 mL 1  . nystatin (MYCOSTATIN/NYSTOP) powder Apply topically 3 (three) times daily. 15 g 0  . oxyCODONE (ROXICODONE) 5 MG immediate release tablet Take 1 tablet (5 mg total) by mouth every 4 (four) hours as needed for severe pain. May take 1/2 tab q4hrs as needed for mild pain. 30 tablet 0  . OXYGEN Inhale 2 L into the lungs at bedtime.    . pantoprazole (PROTONIX) 40 MG tablet Take 1 tablet (40 mg total) 2 (two) times daily by mouth. 60 tablet 3  . Polyethyl Glycol-Propyl Glycol 0.4-0.3 % SOLN Place 1 drop into both eyes 4 (four) times daily.     . sertraline (ZOLOFT) 100 MG tablet TAKE ONE TABLET BY MOUTH ONCE DAILY FOR DEPRESSION 90 tablet 1  . warfarin (COUMADIN) 2.5 MG tablet TAKE AS DIRECTED BY COUMADIN CLINIC. 75 tablet 1  . Water For Irrigation, Sterile (FREE WATER) SOLN Place 60 mLs into feeding tube 3 (three) times daily.     Cliffton Asters Petrolatum-Mineral Oil (SYSTANE NIGHTTIME) OINT Apply 1 application to eye  at bedtime. Both eyes     No current facility-administered medications on file prior to visit.     Allergies  Allergen Reactions  . Penicillins Itching, Rash and Other (See  Comments)    Tolerated unasyn, zosyn & cephalosporins in the past.  Has patient had a PCN reaction causing immediate rash, facial/tongue/throat swelling, SOB or lightheadedness with hypotension: Yes Has patient had a PCN reaction causing severe rash involving mucus membranes or skin necrosis: Unk Has patient had a PCN reaction that required hospitalization: Unk Has patient had a PCN reaction occurring within the last 10 years: Unk If all of the above answers are "NO", then may proceed with Cephalosporins  . Other     Patient tolerates amoxicillin and cephalosporins     Assessment/Plan:  1. Hypertension - Blood pressure is currently uncontrolled, I will increase hydralazine to 50 mg 3 times a day, follow up in 3 months with labs.  2. Acute on chronic diastolic CHF, euvolemic, continue the same dose of Lasix, she appears euvolemic and wearing compression socks.  3. Afib/ atrial flutter - paroxysmal, rate controlled.  Continue coumadin because of history of stroke as well DVT in April 2018 as well. INR therapeutic to high.  4. Dysuria, recently on nitrofurantoin, she is going to follow with her primary care tomorrow.  Follow up in 3 months.  Tobias Alexander, MD 04/30/2017

## 2017-04-30 NOTE — Patient Instructions (Signed)
Description   Do not take coumadin today Jan 28th then change coumadin dose to  2 tablets daily except 3 tablets only on  Fridays.  Recheck INR in 2 weeks.   Call us with any medication changes or concerns [phone]. Main # [phone].

## 2017-05-01 ENCOUNTER — Encounter: Payer: Self-pay | Admitting: Internal Medicine

## 2017-05-01 ENCOUNTER — Ambulatory Visit (INDEPENDENT_AMBULATORY_CARE_PROVIDER_SITE_OTHER): Payer: Medicare Other | Admitting: Internal Medicine

## 2017-05-01 VITALS — BP 130/60 | HR 66 | Temp 97.2°F | Ht 65.0 in | Wt 158.0 lb

## 2017-05-01 DIAGNOSIS — M542 Cervicalgia: Secondary | ICD-10-CM

## 2017-05-01 DIAGNOSIS — J449 Chronic obstructive pulmonary disease, unspecified: Secondary | ICD-10-CM | POA: Diagnosis not present

## 2017-05-01 DIAGNOSIS — I10 Essential (primary) hypertension: Secondary | ICD-10-CM

## 2017-05-01 DIAGNOSIS — I480 Paroxysmal atrial fibrillation: Secondary | ICD-10-CM | POA: Diagnosis not present

## 2017-05-01 DIAGNOSIS — R300 Dysuria: Secondary | ICD-10-CM

## 2017-05-01 DIAGNOSIS — Z931 Gastrostomy status: Secondary | ICD-10-CM | POA: Diagnosis not present

## 2017-05-01 DIAGNOSIS — I70235 Atherosclerosis of native arteries of right leg with ulceration of other part of foot: Secondary | ICD-10-CM

## 2017-05-01 DIAGNOSIS — G8929 Other chronic pain: Secondary | ICD-10-CM | POA: Diagnosis not present

## 2017-05-01 NOTE — Patient Instructions (Addendum)
Will call with lab results  Continue current medications as ordered  Follow up with specialists as scheduled  Follow up with Coumadin clinic with cardiology as scheduled  Follow up in 2 mos for GOO, PAF, COPD, hyperlipidemia

## 2017-05-01 NOTE — Progress Notes (Signed)
Patient ID: Shannon [name], female   DOB: 07-Apr-1923, 84 y.o.   MRN: [id number]   Location:  [facility] OFFICE  Provider: DR Elmon Kirschner  Code Status:  Goals of Care:  Advanced Directives 03/09/2017  Does Patient Have a Medical Advance Directive? Yes  Type of Advance Directive Healthcare Power of Attorney  Does patient want to make changes to medical advance directive? -  Copy of Healthcare Power of Attorney in Chart? Yes  Would patient like information on creating a medical advance directive? -     Chief Complaint  Patient presents with  . Medical Management of Chronic Issues    2 month follow-up, asthma/COPD, chronic HF  . Medication Refill    No Refills    HPI: Patient is a 84 y.o. female seen today for medical management of chronic diseases.  She c/o 1 week hx dysuria. Pain worse when she has BM.   She had Peg tube declogged yesterday by IR. No issues since then with TF  She continues to have severe neck pain. She has not had neck imaging recently.  HTN/afib/aflutter/hyperlipidemia/chronic dHF - BP stable on hydralazine, coreg and imdur. Cholesterol stable on lipitor. Rate controlled on coreg.  She takes coumadin for anticoagulation. INR Followed by cardio Dr Delton See. GOAL INR 2-3. EF 60-65%  GOO - s/p GJ tube. FT changed by IR. Takes Protonix BID. Constipation stable on lactulose. She gets TF at night (osmolite 1.5 cal 237 ml or 3.3 cans per day with promod 3 oz per day @ 50ml.hr x 16hrs) and free water flushes (230 ml) BID. Drinks liquids po. Takes zofran prn. Albumin improved at 3.5  Hyponatremia/ hx SIADH- stable. she was taking salt tabs but noticed the entire tab in her stool intermittently and no longer takes it. Na 137  DM - previously diet controlled but now on insulin tx (novolog SSI and lantus qhs). A1c 7.7%. Takes gabapentin for neuropathy  Hx CVA - stable. Takes coumadin daily. No bloody stools. INR Goal 2-3  Depression - mood stable on sertraline  Arthritis -  stable on roxicodone. She does take gabapentin for neuropathy. Tramadol is ineffective and she had increased confusion on norco. Tylenol ineffective.  Hypothyroidism - stable on levothyroxine 137 mcg daily. TSH 0.67  Hx Pressure sore/right 3rd toe osteomyelitis -she noticed more bleeding from right toe and plans to f/u with podiatry. She completed 1 month of augmentin for osteo. 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 Followed by podiatry.   PAD - stable. s/p left SFA stent 09/21/16. ABI on 09/24/16 showed right 0.86; left 0.96. followed by vascular sx.  COPD/asthma -  stable on dulera, xopenex hfa, duonebs. Followed by pulmonary.   Past Medical History:  Diagnosis Date  . Acute respiratory failure (HCC)   . Anemia    previous blood transfusions  . Arthritis    "all over"  . Asthma   . Bradycardia    requiring previous d/c of BB and reduction of amiodarone  . CAD (coronary artery disease)    nonobstructive per notes  . Chronic diastolic CHF (congestive heart failure) (HCC)   . CKD (chronic kidney disease), stage III (HCC)   . Complication of blood transfusion    "got the wrong blood type at [facility] in ~ 2015; no adverse reaction that we are aware of"/daughter, Shannon [name] (01/27/2016)  . COPD (chronic obstructive pulmonary disease) (HCC)   . Depression    "light case"  . DVT (  deep venous thrombosis) (HCC) 01/2016   a. LLE DVT 01/2016 - switched from Eliquis to Coumadin.  . Gastric stenosis    a. s/p stomach tube  . GERD (gastroesophageal reflux disease)   . History of blood transfusion    "several" (01/27/2016)  . History of stomach ulcers   . Hyperlipidemia   . Hypertension   . Hypothyroidism   . Paraesophageal hernia   . Perforated gastric ulcer (HCC)   . Seasonal allergies   . SIADH (syndrome of inappropriate ADH production) (HCC)    [name] 01/10/2015  . Small bowel obstruction (HCC)    "I don't know how many" (01/11/2015)  . Stroke  (HCC)    "light one"  . Type II diabetes mellitus (HCC)    "related to prednisone use  for > 20 yr; once predinose stopped; no more DM RX" (01/27/2016)  . UTI (urinary tract infection) 02/08/2016  . Ventral hernia with bowel obstruction     Past Surgical History:  Procedure Laterality Date  . ABDOMINAL AORTOGRAM N/A 09/21/2016   Procedure: Abdominal Aortogram;  Surgeon: Maeola Harman, MD;  Location: [facility] INVASIVE CV LAB;  Service: Cardiovascular;  Laterality: N/A;  . AMPUTATION Left 09/25/2016   Procedure: AMPUTATION DIGIT- LEFT 2ND AND 3RD TOES;  Surgeon: Larina Earthly, MD;  Location: [facility] OR;  Service: Vascular;  Laterality: Left;  . CATARACT EXTRACTION W/ INTRAOCULAR LENS  IMPLANT, BILATERAL    . CHOLECYSTECTOMY OPEN    . COLECTOMY    . ESOPHAGOGASTRODUODENOSCOPY N/A 01/19/2014   Procedure: ESOPHAGOGASTRODUODENOSCOPY (EGD);  Surgeon: Hilarie Fredrickson, MD;  Location: [facility] ENDOSCOPY;  Service: Endoscopy;  Laterality: N/A;  . ESOPHAGOGASTRODUODENOSCOPY N/A 01/20/2014   Procedure: ESOPHAGOGASTRODUODENOSCOPY (EGD);  Surgeon: Hilarie Fredrickson, MD;  Location: [facility] ENDOSCOPY;  Service: Endoscopy;  Laterality: N/A;  . ESOPHAGOGASTRODUODENOSCOPY N/A 03/19/2014   Procedure: ESOPHAGOGASTRODUODENOSCOPY (EGD);  Surgeon: Rachael Fee, MD;  Location: [facility] ENDOSCOPY;  Service: Endoscopy;  Laterality: N/A;  . ESOPHAGOGASTRODUODENOSCOPY N/A 07/08/2015   Procedure: ESOPHAGOGASTRODUODENOSCOPY (EGD);  Surgeon: Sherrilyn Rist, MD;  Location: [facility] ENDOSCOPY;  Service: Endoscopy;  Laterality: N/A;  . ESOPHAGOGASTRODUODENOSCOPY (EGD) WITH PROPOFOL N/A 09/15/2015   Procedure: ESOPHAGOGASTRODUODENOSCOPY (EGD) WITH PROPOFOL;  Surgeon: Ruffin Frederick, MD;  Location: [facility] ENDOSCOPY;  Service: Gastroenterology;  Laterality: N/A;  . GASTROJEJUNOSTOMY     hx/notes 01/10/2015  . GASTROJEJUNOSTOMY N/A 09/23/2015   Procedure: OPEN GASTROJEJUNOSTOMY TUBE PLACEMENT;  Surgeon: De Blanch Kinsinger, MD;  Location: [facility] ORS;   Service: General;  Laterality: N/A;  . GLAUCOMA SURGERY Bilateral   . HERNIA REPAIR  2015  . IR CM INJ ANY COLONIC TUBE W/FLUORO  01/05/2017  . IR GENERIC HISTORICAL  01/07/2016   IR GJ TUBE CHANGE 01/07/2016 Malachy Moan, MD [facility]INTER[facility]  . IR GENERIC HISTORICAL  01/27/2016   IR MECH REMOV OBSTRUC MAT ANY COLON TUBE W/FLUORO 01/27/2016 Richarda Overlie, MD [facility]  . IR GENERIC HISTORICAL  02/07/2016   IR PATIENT EVAL TECH 0-60 MINS Darrell K Allred, PA-C [facility]  . IR GENERIC HISTORICAL  02/08/2016   IR GJ TUBE CHANGE 02/08/2016 Berdine Dance, MD [facility]  . IR GENERIC HISTORICAL  01/06/2016   IR GJ TUBE CHANGE 01/06/2016 [facility]-RAD OUT REF  . IR GENERIC HISTORICAL  05/02/2016   IR CM INJ ANY COLONIC TUBE W/FLUORO 05/02/2016 Oley Balm, MD [facility]  . IR GENERIC HISTORICAL  05/15/2016   IR GJ TUBE CHANGE 05/15/2016 Simonne Come, MD [facility]  . IR GENERIC HISTORICAL  06/28/2016   IR GJ TUBE CHANGE 06/28/2016 [facility]  . IR GJ TUBE CHANGE  02/20/2017  . IR PATIENT EVAL TECH 0-60 MINS  10/19/2016  . IR PATIENT EVAL TECH 0-60 MINS  12/25/2016  . IR PATIENT EVAL TECH 0-60 MINS  01/29/2017  . IR PATIENT EVAL TECH 0-60 MINS  04/04/2017  . IR PATIENT EVAL TECH 0-60 MINS  04/30/2017  . IR REPLC DUODEN/JEJUNO TUBE PERCUT W/FLUORO  11/14/2016  . LAPAROTOMY N/A 01/20/2015   Procedure: EXPLORATORY LAPAROTOMY;  Surgeon: Abigail Miyamoto, MD;  Location: [facility] OR;  Service: General;  Laterality: N/A;  . LOWER EXTREMITY ANGIOGRAPHY Left 09/21/2016   Procedure: Lower Extremity Angiography;  Surgeon: Maeola Harman, MD;  Location: [facility] INVASIVE CV LAB;  Service: Cardiovascular;  Laterality: Left;  . LYSIS OF ADHESION N/A 01/20/2015   Procedure: LYSIS OF ADHESIONS < 1 HOUR;  Surgeon: Abigail Miyamoto, MD;  Location: [facility] OR;  Service: General;  Laterality: N/A;  . PERIPHERAL VASCULAR BALLOON ANGIOPLASTY Left 09/21/2016   Procedure: Peripheral Vascular Balloon Angioplasty;  Surgeon: Maeola Harman, MD;  Location: [facility] INVASIVE CV LAB;  Service: Cardiovascular;  Laterality: Left;  drug coated balloon  . TONSILLECTOMY    . TUBAL LIGATION    . VENTRAL HERNIA REPAIR  2015   incarcerated ventral hernia ([facility] 09/2013)/notes 01/10/2015     reports that  has never smoked. She has quit using smokeless tobacco. Her smokeless tobacco use included snuff. She reports that she does not drink alcohol or use drugs. Social History   Socioeconomic History  . Marital status: Widowed    Spouse name: Not on file  . Number of children: 3  . Years of education: Not on file  . Highest education level: Not on file  Social Needs  . Financial resource strain: Not on file  . Food insecurity - worry: Not on file  . Food insecurity - inability: Not on file  . Transportation needs - medical: Not on file  . Transportation needs - non-medical: Not on file  Occupational History  . Not on file  Tobacco Use  . Smoking status: Never Smoker  . Smokeless tobacco: Former Neurosurgeon    Types: Snuff  . Tobacco comment: "used snuff in my younger days"  Substance and Sexual Activity  . Alcohol use: No    Alcohol/week: 0.0 oz  . Drug use: No  . Sexual activity: No  Other Topics Concern  . Not on file  Social History Narrative   Married in [place], lives in one story house with 2 people and no pets   Occupation: ?   Has a living Will, [place], and doesn't have DNR   Was living with Husband (in frail health) in [place] until 09/2013.  After her [facility] admission they both came to live with daughter in [place]    Family History  Problem Relation Age of Onset  . Stroke Mother   . Hypertension Mother   . Diabetes Brother   . Glaucoma Son   . Glaucoma Son   . Heart attack Neg Hx     Allergies  Allergen Reactions  . Penicillins Itching, Rash and Other (See Comments)    Tolerated unasyn, zosyn & cephalosporins in the past.  Has patient had a PCN reaction causing immediate rash, facial/tongue/throat  swelling, SOB or lightheadedness with hypotension: Yes Has patient had a PCN reaction causing severe rash involving mucus membranes or skin necrosis: Unk Has patient had a PCN reaction that required hospitalization:  Unk Has patient had a PCN reaction occurring within the last 10 years: Unk If all of the above answers are "NO", then may proceed with Cephalosporins  . Other     Patient tolerates amoxicillin and cephalosporins    Outpatient Encounter Medications as of 05/01/2017  Medication Sig  . acetaminophen (TYLENOL) 500 MG tablet Take 1,000 mg by mouth every 6 (six) hours as needed (pain).  . AMINO ACIDS-PROTEIN HYDROLYS PO Take 30 mLs by mouth 2 (two) times daily.  [name] atorvastatin (LIPITOR) 10 MG tablet TAKE ONE TABLET BY MOUTH ONCE DAILY  . b complex vitamins tablet Take 1 tablet by mouth daily.   . calcium-vitamin D (OSCAL WITH D) 500-200 MG-UNIT per tablet Take 1 tablet by mouth daily with breakfast.   . carvedilol (COREG) 6.25 MG tablet Take 2 tabs po in AM and 1 tab po in PM for blood pressure  . cycloSPORINE (RESTASIS) 0.05 % ophthalmic emulsion USE 1 DROP INTO BOTH EYES TWICE DAILY  . feeding supplement (OSMOLITE 1 CAL) LIQD Take 711 mLs by mouth at bedtime.   . ferrous sulfate 325 (65 FE) MG tablet Take one tablet by mouth once daily with breakfast  . fluticasone (FLONASE) 50 MCG/ACT nasal spray Place 2 sprays into both nostrils daily.  . furosemide (LASIX) 40 MG tablet Take 1 tablet (40 mg total) by mouth daily.  [name] gabapentin (NEURONTIN) 300 MG capsule TAKE 2 CAPSULES (600 MG TOTAL) BY MOUTH 3 (THREE) TIMES DAILY.  [name] gentamicin cream (GARAMYCIN) 0.1 % Apply 1 application topically 3 (three) times daily.  . insulin aspart (NOVOLOG FLEXPEN) 100 UNIT/ML FlexPen Take three times daily before each meal based on sliding scale 140-199( 2 units), 200-250 (4 units), 251-299 (6 units), 300-349 (8 units), > 350(10 units)  . Insulin Glargine (LANTUS) 100 UNIT/ML Solostar Pen Injection 0.1 ml  (10 units) once daily to control blood sugar. Dx E11.9  . Insulin Pen Needle (B-D UF III MINI PEN NEEDLES) 31G X 5 MM MISC Use twice daily with giving insulin injections. Dx E11.9  . ipratropium-albuterol (DUONEB) 0.5-2.5 (3) MG/3ML SOLN Take 3 mLs by nebulization every 6 (six) hours as needed (sob). Use 3 times daily x 4 days then every 6 hours as needed.  . isosorbide mononitrate (IMDUR) 30 MG 24 hr tablet Take 2 tablets (60 mg total) by mouth daily.  [name] latanoprost (XALATAN) 0.005 % ophthalmic solution Place 1 drop into both eyes at bedtime.  . levalbuterol (XOPENEX HFA) 45 MCG/ACT inhaler Inhale 2 puffs into the lungs every 6 (six) hours as needed for wheezing or shortness of breath.  . levothyroxine (SYNTHROID, LEVOTHROID) 137 MCG tablet TAKE 1 TABLET (137 MCG TOTAL) BY MOUTH DAILY BEFORE BREAKFAST.  . Maltodextrin-Xanthan Gum (RESOURCE THICKENUP CLEAR) POWD Take 120 g by mouth as needed (use to thicken liquids.).  [name] mometasone-formoterol (DULERA) 200-5 MCG/ACT AERO Inhale 2 puffs into the lungs 2 (two) times daily.  . Multiple Vitamin (MULTIVITAMIN WITH MINERALS) TABS tablet Take 1 tablet by mouth daily.  . nitrofurantoin, macrocrystal-monohydrate, (MACROBID) 100 MG capsule Take 1 capsule (100 mg total) by mouth 2 (two) times daily.  [name] nystatin (MYCOSTATIN/NYSTOP) powder Apply topically 3 (three) times daily.  [name] oxyCODONE (ROXICODONE) 5 MG immediate release tablet Take 1 tablet (5 mg total) by mouth every 4 (four) hours as needed for severe pain. May take 1/2 tab q4hrs as needed for mild pain.  . OXYGEN Inhale 2 L into the lungs at bedtime.  . pantoprazole (PROTONIX) 40 MG  tablet Take 1 tablet (40 mg total) 2 (two) times daily by mouth.  Bertram Gala Glycol-Propyl Glycol 0.4-0.3 % SOLN Place 1 drop into both eyes 4 (four) times daily.   . sertraline (ZOLOFT) 100 MG tablet TAKE ONE TABLET BY MOUTH ONCE DAILY FOR DEPRESSION  . warfarin (COUMADIN) 2.5 MG tablet TAKE AS DIRECTED BY COUMADIN CLINIC.    [name] Water For Irrigation, Sterile (FREE WATER) SOLN Place 60 mLs into feeding tube 3 (three) times daily.   Cliffton Asters Petrolatum-Mineral Oil (SYSTANE NIGHTTIME) OINT Apply 1 application to eye at bedtime. Both eyes  . hydrALAZINE (APRESOLINE) 50 MG tablet Take 1 tablet (50 mg total) by mouth 3 (three) times daily. (Patient not taking: Reported on 05/01/2017)  . nystatin (MYCOSTATIN) 100000 UNIT/ML suspension 5 ml swish and spit 4 times daily for 1 week then as needed until resolves (Patient not taking: Reported on 05/01/2017)   No facility-administered encounter medications on file as of 05/01/2017.     Review of Systems:  Review of Systems  Constitutional: Positive for fatigue.  HENT: Positive for trouble swallowing.   Gastrointestinal: Positive for abdominal distention.  Genitourinary: Positive for dysuria.  Neurological: Positive for numbness.  All other systems reviewed and are negative.   Health Maintenance  Topic Date Due  . HEMOGLOBIN A1C  02/16/2017  . MAMMOGRAM  10/17/2023 (Originally 10/23/2015)  . OPHTHALMOLOGY EXAM  10/15/2024 (Originally 07/17/2015)  . FOOT EXAM  08/22/2017  . TETANUS/TDAP  07/19/2026  . INFLUENZA VACCINE  Completed  . DEXA SCAN  Completed  . PNA vac Low Risk Adult  Completed  . URINE MICROALBUMIN  Discontinued    Physical Exam: Vitals:   05/01/17 1139  Weight: 158 lb (71.7 kg)  Height: 5\' 5"  (1.651 m)   Body mass index is 26.29 kg/m. Physical Exam  Constitutional: She is oriented to person, place, and time. She appears well-developed.  Frail appearing in NAD  HENT:  Mouth/Throat: Oropharynx is clear and moist. No oropharyngeal exudate.  MMM; no oral thrush  Eyes: Pupils are equal, round, and reactive to light. No scleral icterus.  Neck: Neck supple. Carotid bruit is not present. No tracheal deviation present. No thyromegaly present.  Cardiovascular: Normal rate, regular rhythm and intact distal pulses. Exam reveals no gallop and no friction rub.   Murmur (1/6 SEM) heard. No LE edema b/l. no calf TTP.   Pulmonary/Chest: Effort normal and breath sounds normal. No stridor. No respiratory distress. She has no wheezes. She has no rales.  Abdominal: Soft. Normal appearance and bowel sounds are normal. She exhibits distension. She exhibits no mass. There is no hepatomegaly. There is tenderness (suprapubic; LUQ). There is no rigidity, no rebound and no guarding. No hernia.  GJ tube intact with no redness or d/c at insertion site; clamped; (+) right CVAT  Musculoskeletal: She exhibits edema and tenderness.       Lumbar back: She exhibits decreased range of motion, tenderness and spasm.       Back:  Lymphadenopathy:    She has no cervical adenopathy.  Neurological: She is alert and oriented to person, place, and time.  Skin: Skin is warm and dry. No rash noted.  Psychiatric: She has a normal mood and affect. Her behavior is normal. Judgment and thought content normal.    Labs reviewed: Basic Metabolic Panel: Recent Labs    06/08/16 1523  07/21/16 1124  08/16/16 0616  08/18/16 0517  09/25/16 0524  02/21/17 0440 02/22/17 0436 02/23/17 0330  NA 135   < >  --    < >  141   < > 135   < > 136   < > 144 141 137  K 4.4   < >  --    < > 4.2   < > 4.0   < > 4.6   < > 4.4 4.2 4.1  CL 99*   < >  --    < > 107   < > 101   < > 105   < > 114* 114* 111  CO2 29   < >  --    < > 25   < > 24   < > 25   < > 26 22 22   GLUCOSE 148*   < >  --    < > 350*   < > 154*   < > 106*   < > 138* 146* 142*  BUN 54*   < >  --    < > 54*   < > 49*   < > 32*   < > 53* 32* 24*  CREATININE 1.97*   < >  --    < > 1.04*   < > 0.96   < > 1.03*   < > 1.30* 1.03* 0.99  CALCIUM 8.7*   < >  --    < > 9.4   < > 9.1   < > 8.7*   < > 9.0 8.9 9.0  MG 1.9   < >  --    < > 1.9  --  1.9  --   --   --   --  1.9 1.6*  PHOS  --    < >  --    < > 2.2*  --   --   --  3.5  --   --   --  2.1*  TSH 10.017*  --  4.97*  --   --   --  0.670  --   --   --   --   --   --    < > = values in  this interval not displayed.   Liver Function Tests: Recent Labs    09/26/16 0435 10/03/16 1610 11/02/16 1710  AST 25 20 24   ALT 31 25 19   ALKPHOS 57 73 58  BILITOT 0.4 0.4 0.5  PROT 5.4* 7.1 7.0  ALBUMIN 2.0* 3.2* 3.5   No results for input(s): LIPASE, AMYLASE in the last 8760 hours. No results for input(s): AMMONIA in the last 8760 hours. CBC: Recent Labs    02/21/17 0440 02/22/17 0436 02/23/17 0330  WBC 6.4 6.3 7.4  NEUTROABS 3.3 3.3 4.4  HGB 8.8* 8.9* 9.3*  HCT 27.0* 26.9* 27.8*  MCV 94.1 93.4 91.7  PLT 233 238 250   Lipid Panel: No results for input(s): CHOL, HDL, LDLCALC, TRIG, CHOLHDL, LDLDIRECT in the last 8760 hours. Lab Results  Component Value Date   HGBA1C 7.7 (H) 08/16/2016    Procedures since last visit: Dg Foot Complete Right  Result Date: 04/02/2017 Please see detailed radiograph report in office note.  Ir Patient Eval Tech 0-60 Mins  Result Date: 04/30/2017 Daryel November     04/30/2017  5:20 PM Patient came in today with complaint of clogged j tube x 2 days.  I was able to unclog the tube using a stiff amplatz wire.  The catheter was flushed with several syringes of saline.  The patient and her daughter were happy with the outcome.  I explained that we were ordering  her a different type of GJ catheter tomorrow and will call them when we receive it.  Hopefully, the new tube will help with the clogging issues she has with  the current tube product.  Ir Patient Eval Tech 0-60 Mins  Result Date: 04/04/2017 Daryel November     04/04/2017  4:28 PM Patient and her daughter came in today with complaint of clogged j port in GJ tube .  I was able to unclog the tube with a 10 cc syringe using constant even pressure injection.  They were satisfied with the outcome and will call if she has continued problems.  She is very interested in any other GJ alternative we can give.  I told her we would look into it.   Assessment/Plan   ICD-10-CM   1. Dysuria R30.0  Urinalysis with Reflex Microscopic    Culture, Urine  2. Chronic neck pain M54.2 DG Cervical Spine Complete   G89.29   3. PEG (percutaneous endoscopic gastrostomy) status (HCC) Z93.1   4. Essential hypertension, benign I10   5. COPD with asthma (HCC) J44.9   6. Paroxysmal atrial fibrillation (HCC) I48.0      Will call with lab results  Continue current medications as ordered  Follow up with specialists as scheduled  Follow up with Coumadin clinic with cardiology as scheduled  Follow up in 2 mos for GOO, PAF, COPD, hyperlipidemia  Will need lipid panel next ov  Breyon Blass S. Ancil Linsey  [facility] [address] [place], [place] [address] [phone] Cell (Monday-Friday 8 AM - 5 PM) [phone] After 5 PM and follow prompts

## 2017-05-02 ENCOUNTER — Telehealth: Payer: Self-pay | Admitting: Cardiology

## 2017-05-02 LAB — URINALYSIS, ROUTINE W REFLEX MICROSCOPIC
Bilirubin Urine: NEGATIVE
Glucose, UA: NEGATIVE
Ketones, ur: NEGATIVE
Nitrite: NEGATIVE
Specific Gravity, Urine: 1.012 (ref 1.001–1.03)
Squamous Epithelial / LPF: NONE SEEN /HPF (ref <=5)
WBC, UA: >=60 /HPF — AB (ref 0–5)
pH: 7.5 (ref 5.0–8.0)

## 2017-05-02 NOTE — Telephone Encounter (Signed)
Left a message for the pts daughter to call back.  

## 2017-05-02 NOTE — Telephone Encounter (Signed)
New Message   Pt c/o medication issue:  1. Name of Medication: hydrALAZINE (APRESOLINE) 50 MG tablet  2. How are you currently taking this medication (dosage and times per day)? Hasn't started new dosage yet  3. Are you having a reaction (difficulty breathing--STAT)? No  4. What is your medication issue? Per daughter Dr. Meda Coffee changed the dosage, and the change is going to make the patients B/P drop. Requesting call back from nurse

## 2017-05-03 ENCOUNTER — Encounter: Payer: Self-pay | Admitting: Internal Medicine

## 2017-05-03 MED ORDER — HYDRALAZINE HCL 50 MG PO TABS: 50.0000 mg | ORAL_TABLET | Freq: Three times a day (TID) | ORAL | 1 refills | Status: DC

## 2017-05-03 NOTE — Telephone Encounter (Signed)
  Assessment/Plan:  1. Hypertension - Blood pressure is currently uncontrolled, I will increase hydralazine to 50 mg 3 times a day, follow up in 3 months with labs.  2. Acute on chronic diastolic CHF, euvolemic, continue the same dose of Lasix, she appears euvolemic and wearing compression socks.  3. Afib/ atrial flutter - paroxysmal, rate controlled.  Continue coumadin because of history of stroke as well DVT in April 2018 as well. INR therapeutic to high.  4. Dysuria, recently on nitrofurantoin, she is going to follow with her primary care tomorrow.  Follow up in 3 months.  Ena Dawley, MD 04/30/2017    Daughter is calling to confirm the dose of hydralazine that the pt should be taking, since she saw Dr Meda Coffee on 04/30/17.   Daughter states that her PCP changed the dose around in the system.  Informed the pts daughter that the pt should be taking Hydralazine 50 mg po TID, as indicated above in Dr Francesca Oman last A & P on the pt.  Resent this script back into the pts pharmacy.  Daughter verbalized understanding and agrees with this plan.

## 2017-05-03 NOTE — Telephone Encounter (Signed)
New message ° °Pt verbalized that she is returning call for RN °

## 2017-05-03 NOTE — Progress Notes (Signed)
Subjective:  84 year old female presenting today for follow up evaluation of an ulceration of the right great toe. She states she is doing well overall. She has been applying gentamicin cream as directed. Patient presents today for further treatment and evaluation.   Past Medical History:  Diagnosis Date  . Acute respiratory failure ([place])   . Anemia    previous blood transfusions  . Arthritis    "all over"  . Asthma   . Bradycardia    requiring previous d/c of BB and reduction of amiodarone  . CAD (coronary artery disease)    nonobstructive per notes  . Chronic diastolic CHF (congestive heart failure) ([place])   . CKD (chronic kidney disease), stage III ([place])   . Complication of blood transfusion    "got the wrong blood type at [facility] in ~ 2015; no adverse reaction that we are aware of"/daughter, [name] (01/27/2016)  . COPD (chronic obstructive pulmonary disease) ([place])   . Depression    "light case"  . DVT (deep venous thrombosis) ([place]) 01/2016   a. LLE DVT 01/2016 - switched from Eliquis to Coumadin.  . Gastric stenosis    a. s/p stomach tube  . GERD (gastroesophageal reflux disease)   . History of blood transfusion    "several" (01/27/2016)  . History of stomach ulcers   . Hyperlipidemia   . Hypertension   . Hypothyroidism   . Paraesophageal hernia   . Perforated gastric ulcer ([place])   . Seasonal allergies   . SIADH (syndrome of inappropriate ADH production) ([place])    [name] 01/10/2015  . Small bowel obstruction ([place])    "I don't know how many" (01/11/2015)  . Stroke ([place])    "light one"  . Type II diabetes mellitus ([place])    "related to prednisone use  for > 20 yr; once predinose stopped; no more DM RX" (01/27/2016)  . UTI (urinary tract infection) 02/08/2016  . Ventral hernia with bowel obstruction       Objective/Physical Exam General: The patient is alert and oriented x3 in no acute distress.  Dermatology:  Wound #1 noted to the right great toe measuring 1.3  x 1.5 x 0.1 cm (LxWxD).   To the noted ulceration(s), there is no eschar. There is a moderate amount of slough, fibrin, and necrotic tissue noted. Granulation tissue and wound base is red. There is a minimal amount of serosanguineous drainage noted. There is no exposed bone muscle-tendon ligament or joint. There is no malodor. Periwound integrity is intact. Skin is warm, dry and supple bilateral lower extremities.  Vascular: Palpable pedal pulses bilaterally. No edema or erythema noted. Capillary refill within normal limits.  Neurological: Epicritic and protective threshold absent bilaterally.   Musculoskeletal Exam: Range of motion within normal limits to all pedal and ankle joints bilateral. Muscle strength 5/5 in all groups bilateral.   Assessment: #1 ulceration of the right great toe secondary to diabetes mellitus #2 diabetes mellitus w/ peripheral neuropathy   Plan of Care:  #1 Patient was evaluated. #2 medically necessary excisional debridement including subcutaneous tissue was performed using a tissue nipper and a chisel blade. Excisional debridement of all the necrotic nonviable tissue down to healthy bleeding viable tissue was performed with post-debridement measurements same as pre-. #3 the wound was cleansed and dry sterile dressing applied. #4 Continue using gentamicin cream daily with a Band-Aid. #5 Refill prescription for gentamicin cream provided to patient.  #6 Continue wearing post op shoe. #7 Return to clinic in 3 weeks.  Edrick Kins, DPM [facility]  Dr. Edrick Kins, [address]                                        [place], [place] [address]                Office [phone]  Fax [phone]

## 2017-05-04 ENCOUNTER — Encounter: Payer: Self-pay | Admitting: Internal Medicine

## 2017-05-04 ENCOUNTER — Telehealth: Payer: Self-pay

## 2017-05-04 DIAGNOSIS — Z1612 Extended spectrum beta lactamase (ESBL) resistance: Principal | ICD-10-CM

## 2017-05-04 DIAGNOSIS — N390 Urinary tract infection, site not specified: Secondary | ICD-10-CM | POA: Insufficient documentation

## 2017-05-04 DIAGNOSIS — B9629 Other Escherichia coli [E. coli] as the cause of diseases classified elsewhere: Secondary | ICD-10-CM | POA: Insufficient documentation

## 2017-05-04 LAB — URINE CULTURE
MICRO NUMBER:: 90125162
SPECIMEN QUALITY:: ADEQUATE

## 2017-05-04 MED ORDER — IMIPENEM-CILASTATIN 250 MG IV SOLR: 300.0000 mg | Freq: Four times a day (QID) | INTRAVENOUS | 0 refills | Status: AC

## 2017-05-04 NOTE — Telephone Encounter (Signed)
[place] faxed Rx, Referral, Demographics and insurance information to [facility].

## 2017-05-04 NOTE — Telephone Encounter (Addendum)
Discussed results with Leland Johns states they have used [facility] in the past.   Home Health order pending for Gildardo Cranker, DO to complete.   I also called [facility] @ [phone], option 1 to make referral.  Spoke with Erasmo Downer, we need to fax rx, referral, demographics, and insurance information to [phone]  1.) Please add medication to list, I got several options when I pull up Primaxin and Primaxin 300 mg was not an option  (Print)  2.) Please complete pending referral to home health

## 2017-05-04 NOTE — Telephone Encounter (Signed)
[facility] called Dr. Eulas Post and stated that they could do a Peripheral IV over the weekend but she would need a mid line IV Monday for the rest of the antibiotic IV. We wasn't sure of how to place order.   I called [facility] P[facility] and it just rang.  I called the operator and she transferred me to Vascular Speciality IV Team and I LMOM to return call. Awaiting call back.

## 2017-05-04 NOTE — Telephone Encounter (Signed)
done

## 2017-05-04 NOTE — Telephone Encounter (Signed)
-----   Message from [place], [place] sent at 05/04/2017  1:13 PM EST ----- She has a very resistant bladder infection (ESBL) and will require IV antibiotic - refer to home health RN; Rx primaxin 300mg  IV q6hrs x 10 days. She will need to follow up as scheduled or sooner if no improvement.

## 2017-05-05 DIAGNOSIS — Z794 Long term (current) use of insulin: Secondary | ICD-10-CM | POA: Diagnosis not present

## 2017-05-05 DIAGNOSIS — E1122 Type 2 diabetes mellitus with diabetic chronic kidney disease: Secondary | ICD-10-CM | POA: Diagnosis not present

## 2017-05-05 DIAGNOSIS — F0390 Unspecified dementia without behavioral disturbance: Secondary | ICD-10-CM | POA: Diagnosis not present

## 2017-05-05 DIAGNOSIS — B9620 Unspecified Escherichia coli [E. coli] as the cause of diseases classified elsewhere: Secondary | ICD-10-CM | POA: Diagnosis not present

## 2017-05-05 DIAGNOSIS — I2510 Atherosclerotic heart disease of native coronary artery without angina pectoris: Secondary | ICD-10-CM | POA: Diagnosis not present

## 2017-05-05 DIAGNOSIS — E1151 Type 2 diabetes mellitus with diabetic peripheral angiopathy without gangrene: Secondary | ICD-10-CM | POA: Diagnosis not present

## 2017-05-05 DIAGNOSIS — Z452 Encounter for adjustment and management of vascular access device: Secondary | ICD-10-CM | POA: Diagnosis not present

## 2017-05-05 DIAGNOSIS — J449 Chronic obstructive pulmonary disease, unspecified: Secondary | ICD-10-CM | POA: Diagnosis not present

## 2017-05-05 DIAGNOSIS — Z7901 Long term (current) use of anticoagulants: Secondary | ICD-10-CM | POA: Diagnosis not present

## 2017-05-05 DIAGNOSIS — I5032 Chronic diastolic (congestive) heart failure: Secondary | ICD-10-CM | POA: Diagnosis not present

## 2017-05-05 DIAGNOSIS — N3000 Acute cystitis without hematuria: Secondary | ICD-10-CM | POA: Diagnosis not present

## 2017-05-05 DIAGNOSIS — Z89422 Acquired absence of other left toe(s): Secondary | ICD-10-CM | POA: Diagnosis not present

## 2017-05-05 DIAGNOSIS — Z1612 Extended spectrum beta lactamase (ESBL) resistance: Secondary | ICD-10-CM | POA: Diagnosis not present

## 2017-05-05 DIAGNOSIS — N183 Chronic kidney disease, stage 3 (moderate): Secondary | ICD-10-CM | POA: Diagnosis not present

## 2017-05-05 DIAGNOSIS — K3189 Other diseases of stomach and duodenum: Secondary | ICD-10-CM | POA: Diagnosis not present

## 2017-05-05 DIAGNOSIS — Z931 Gastrostomy status: Secondary | ICD-10-CM | POA: Diagnosis not present

## 2017-05-05 DIAGNOSIS — E1142 Type 2 diabetes mellitus with diabetic polyneuropathy: Secondary | ICD-10-CM | POA: Diagnosis not present

## 2017-05-05 DIAGNOSIS — Z95820 Peripheral vascular angioplasty status with implants and grafts: Secondary | ICD-10-CM | POA: Diagnosis not present

## 2017-05-05 DIAGNOSIS — Z792 Long term (current) use of antibiotics: Secondary | ICD-10-CM | POA: Diagnosis not present

## 2017-05-05 DIAGNOSIS — I130 Hypertensive heart and chronic kidney disease with heart failure and stage 1 through stage 4 chronic kidney disease, or unspecified chronic kidney disease: Secondary | ICD-10-CM | POA: Diagnosis not present

## 2017-05-06 DIAGNOSIS — N3000 Acute cystitis without hematuria: Secondary | ICD-10-CM | POA: Diagnosis not present

## 2017-05-06 DIAGNOSIS — E1151 Type 2 diabetes mellitus with diabetic peripheral angiopathy without gangrene: Secondary | ICD-10-CM | POA: Diagnosis not present

## 2017-05-06 DIAGNOSIS — Z1612 Extended spectrum beta lactamase (ESBL) resistance: Secondary | ICD-10-CM | POA: Diagnosis not present

## 2017-05-06 DIAGNOSIS — E1142 Type 2 diabetes mellitus with diabetic polyneuropathy: Secondary | ICD-10-CM | POA: Diagnosis not present

## 2017-05-06 DIAGNOSIS — Z452 Encounter for adjustment and management of vascular access device: Secondary | ICD-10-CM | POA: Diagnosis not present

## 2017-05-06 DIAGNOSIS — B9620 Unspecified Escherichia coli [E. coli] as the cause of diseases classified elsewhere: Secondary | ICD-10-CM | POA: Diagnosis not present

## 2017-05-07 ENCOUNTER — Other Ambulatory Visit: Payer: Self-pay | Admitting: Internal Medicine

## 2017-05-07 DIAGNOSIS — E113291 Type 2 diabetes mellitus with mild nonproliferative diabetic retinopathy without macular edema, right eye: Secondary | ICD-10-CM | POA: Diagnosis not present

## 2017-05-07 DIAGNOSIS — E113292 Type 2 diabetes mellitus with mild nonproliferative diabetic retinopathy without macular edema, left eye: Secondary | ICD-10-CM | POA: Diagnosis not present

## 2017-05-07 DIAGNOSIS — N390 Urinary tract infection, site not specified: Secondary | ICD-10-CM

## 2017-05-07 LAB — HM DIABETES EYE EXAM

## 2017-05-07 NOTE — Telephone Encounter (Signed)
Shannon [name] with Interventional Radiology [phone] returned call. Appointment set up for a Mid Line Placement at [facility] 05/08/2017@ 8:00am. (1st Floor Radiology) [name], caregiver notified and agreed.  [facility], [place] notified and agreed. She stated that she will let [facility]ney know and will go out tomorrow once placed.  Dr. Eulas Post notified and will sign the order placed by [facility] with Interventional Radiology.

## 2017-05-07 NOTE — Telephone Encounter (Signed)
Called Interventional Radiology at [facility] [phone] and [facility] for [name] (scheduler) to return call. Brandy at [phone] stated to schedule would have to go through the scheduler. Awaiting call.

## 2017-05-07 NOTE — Telephone Encounter (Signed)
[facility] called to inform Dr.Carter that patient was set up for a periphal IV over the weekend in which she pulled out. Patient needs to be set up for a midline IV. I informed caller that we are aware that we need to set up midline IV for someone informed us Friday 05/04/17. Rodena Piety is in process of setting up.  [facility] requested a call when IV is set up for they can bring out additional equipment. [phone]

## 2017-05-07 NOTE — Telephone Encounter (Signed)
Patient's daughter call to get status of Antibiotic set up in home. [name] aware that Rodena Piety (Printmaker) is working on this. [name] requested call on cell phone when complete [phone]

## 2017-05-08 ENCOUNTER — Ambulatory Visit (HOSPITAL_COMMUNITY)
Admission: RE | Admit: 2017-05-08 | Discharge: 2017-05-08 | Disposition: A | Discharge status: Home / Self Care | Payer: Medicare Other | Source: Ambulatory Visit | Attending: Internal Medicine | Admitting: Internal Medicine

## 2017-05-08 ENCOUNTER — Other Ambulatory Visit: Payer: Self-pay | Admitting: Internal Medicine

## 2017-05-08 DIAGNOSIS — Z452 Encounter for adjustment and management of vascular access device: Secondary | ICD-10-CM | POA: Diagnosis not present

## 2017-05-08 DIAGNOSIS — N390 Urinary tract infection, site not specified: Secondary | ICD-10-CM | POA: Diagnosis not present

## 2017-05-08 DIAGNOSIS — Z1612 Extended spectrum beta lactamase (ESBL) resistance: Secondary | ICD-10-CM | POA: Diagnosis not present

## 2017-05-08 DIAGNOSIS — B9620 Unspecified Escherichia coli [E. coli] as the cause of diseases classified elsewhere: Secondary | ICD-10-CM | POA: Diagnosis not present

## 2017-05-08 DIAGNOSIS — E1142 Type 2 diabetes mellitus with diabetic polyneuropathy: Secondary | ICD-10-CM | POA: Diagnosis not present

## 2017-05-08 DIAGNOSIS — E1151 Type 2 diabetes mellitus with diabetic peripheral angiopathy without gangrene: Secondary | ICD-10-CM | POA: Diagnosis not present

## 2017-05-08 DIAGNOSIS — N3000 Acute cystitis without hematuria: Secondary | ICD-10-CM | POA: Diagnosis not present

## 2017-05-08 MED ORDER — IOPAMIDOL (ISOVUE-300) INJECTION 61%
INTRAVENOUS | Status: AC
  Filled 2017-05-08: qty 50

## 2017-05-08 MED ORDER — LIDOCAINE HCL (PF) 1 % IJ SOLN
INTRAMUSCULAR | Status: DC | PRN
  Administered 2017-05-08: 5 mL

## 2017-05-08 MED ORDER — LIDOCAINE HCL 1 % IJ SOLN
INTRAMUSCULAR | Status: AC
  Filled 2017-05-08: qty 20

## 2017-05-08 NOTE — Telephone Encounter (Signed)
[name], daughter called and stated that no one has called her with [facility] to come out to continue the antibiotic. The IV was placed this morning.   I called and Spoke with [employer] at [facility]. Patient Care manager in [place] is sending a nurse out this evening to start Antibiotic. I called daughter and she stated she had spoke to them and Sharee Pimple is coming out this evening.  Told daughter to call me if she had any concerns.

## 2017-05-08 NOTE — Procedures (Signed)
Right brachial midline was placed just prior to the level of the axillary vein.  Unable to access axillary vein to make PICC line go centrally. Minimal EBL No complications Ready for immediate use.  Henreitta Cea 05/08/2017

## 2017-05-09 DIAGNOSIS — E1151 Type 2 diabetes mellitus with diabetic peripheral angiopathy without gangrene: Secondary | ICD-10-CM | POA: Diagnosis not present

## 2017-05-09 DIAGNOSIS — Z1612 Extended spectrum beta lactamase (ESBL) resistance: Secondary | ICD-10-CM | POA: Diagnosis not present

## 2017-05-09 DIAGNOSIS — N3000 Acute cystitis without hematuria: Secondary | ICD-10-CM | POA: Diagnosis not present

## 2017-05-09 DIAGNOSIS — Z452 Encounter for adjustment and management of vascular access device: Secondary | ICD-10-CM | POA: Diagnosis not present

## 2017-05-09 DIAGNOSIS — E1142 Type 2 diabetes mellitus with diabetic polyneuropathy: Secondary | ICD-10-CM | POA: Diagnosis not present

## 2017-05-09 DIAGNOSIS — B9620 Unspecified Escherichia coli [E. coli] as the cause of diseases classified elsewhere: Secondary | ICD-10-CM | POA: Diagnosis not present

## 2017-05-10 ENCOUNTER — Ambulatory Visit (HOSPITAL_COMMUNITY)
Admission: RE | Admit: 2017-05-10 | Discharge: 2017-05-10 | Disposition: A | Discharge status: Home / Self Care | Payer: Medicare Other | Source: Ambulatory Visit | Attending: Interventional Radiology | Admitting: Interventional Radiology

## 2017-05-10 ENCOUNTER — Other Ambulatory Visit (HOSPITAL_COMMUNITY): Payer: Self-pay | Admitting: Interventional Radiology

## 2017-05-10 DIAGNOSIS — R633 Feeding difficulties, unspecified: Secondary | ICD-10-CM

## 2017-05-10 DIAGNOSIS — K9413 Enterostomy malfunction: Secondary | ICD-10-CM | POA: Diagnosis not present

## 2017-05-10 DIAGNOSIS — K9423 Gastrostomy malfunction: Secondary | ICD-10-CM | POA: Diagnosis not present

## 2017-05-10 HISTORY — PX: IR GJ TUBE CHANGE: IMG1440

## 2017-05-10 MED ORDER — SILVER NITRATE-POT NITRATE 75-25 % EX MISC
CUTANEOUS | Status: DC
Start: 2017-05-10 — End: 2017-05-11
  Filled 2017-05-10: qty 2

## 2017-05-10 MED ORDER — IOPAMIDOL (ISOVUE-300) INJECTION 61%
50.0000 mL | Freq: Once | INTRAVENOUS | Status: AC | PRN
  Administered 2017-05-10: 10 mL

## 2017-05-10 MED ORDER — LIDOCAINE VISCOUS 2 % MT SOLN
OROMUCOSAL | Status: DC | PRN
  Administered 2017-05-10: 15 mL via OROMUCOSAL

## 2017-05-10 MED ORDER — IOPAMIDOL (ISOVUE-300) INJECTION 61%
INTRAVENOUS | Status: AC
  Administered 2017-05-10: 10 mL
  Filled 2017-05-10: qty 50

## 2017-05-10 MED ORDER — LIDOCAINE VISCOUS 2 % MT SOLN
OROMUCOSAL | Status: AC
  Filled 2017-05-10: qty 15

## 2017-05-11 ENCOUNTER — Encounter (HOSPITAL_COMMUNITY): Payer: Self-pay | Admitting: Interventional Radiology

## 2017-05-14 ENCOUNTER — Ambulatory Visit (INDEPENDENT_AMBULATORY_CARE_PROVIDER_SITE_OTHER): Payer: Medicare Other

## 2017-05-14 DIAGNOSIS — Z5181 Encounter for therapeutic drug level monitoring: Secondary | ICD-10-CM

## 2017-05-14 DIAGNOSIS — I4892 Unspecified atrial flutter: Secondary | ICD-10-CM | POA: Diagnosis not present

## 2017-05-14 LAB — POCT INR: INR: 4.3

## 2017-05-14 NOTE — Patient Instructions (Signed)
Description   Skip today's dosage of Coumadin, then take 1 tablet tomorrow, then start taking 2 tablets daily.   Recheck INR in 2 weeks.   Call us with any medication changes or concerns [phone]. Main # [phone].

## 2017-05-15 ENCOUNTER — Telehealth: Payer: Self-pay | Admitting: *Deleted

## 2017-05-15 NOTE — Telephone Encounter (Signed)
Amy with [facility] called requesting verbal orders for PICC line dressing change. Verbal orders given.

## 2017-05-16 ENCOUNTER — Telehealth: Payer: Self-pay | Admitting: *Deleted

## 2017-05-16 ENCOUNTER — Ambulatory Visit (INDEPENDENT_AMBULATORY_CARE_PROVIDER_SITE_OTHER): Payer: Medicare Other | Admitting: Nurse Practitioner

## 2017-05-16 ENCOUNTER — Encounter: Payer: Self-pay | Admitting: Nurse Practitioner

## 2017-05-16 VITALS — BP 148/74 | HR 73 | Temp 98.2°F | Ht 65.0 in | Wt 156.0 lb

## 2017-05-16 DIAGNOSIS — I70235 Atherosclerosis of native arteries of right leg with ulceration of other part of foot: Secondary | ICD-10-CM | POA: Diagnosis not present

## 2017-05-16 DIAGNOSIS — Z1612 Extended spectrum beta lactamase (ESBL) resistance: Secondary | ICD-10-CM | POA: Diagnosis not present

## 2017-05-16 DIAGNOSIS — H6121 Impacted cerumen, right ear: Secondary | ICD-10-CM

## 2017-05-16 DIAGNOSIS — G8929 Other chronic pain: Secondary | ICD-10-CM

## 2017-05-16 DIAGNOSIS — E1151 Type 2 diabetes mellitus with diabetic peripheral angiopathy without gangrene: Secondary | ICD-10-CM | POA: Diagnosis not present

## 2017-05-16 DIAGNOSIS — N3000 Acute cystitis without hematuria: Secondary | ICD-10-CM | POA: Diagnosis not present

## 2017-05-16 DIAGNOSIS — B9629 Other Escherichia coli [E. coli] as the cause of diseases classified elsewhere: Secondary | ICD-10-CM

## 2017-05-16 DIAGNOSIS — M542 Cervicalgia: Secondary | ICD-10-CM

## 2017-05-16 DIAGNOSIS — N390 Urinary tract infection, site not specified: Secondary | ICD-10-CM

## 2017-05-16 DIAGNOSIS — Z452 Encounter for adjustment and management of vascular access device: Secondary | ICD-10-CM | POA: Diagnosis not present

## 2017-05-16 DIAGNOSIS — E1142 Type 2 diabetes mellitus with diabetic polyneuropathy: Secondary | ICD-10-CM | POA: Diagnosis not present

## 2017-05-16 DIAGNOSIS — B9620 Unspecified Escherichia coli [E. coli] as the cause of diseases classified elsewhere: Secondary | ICD-10-CM | POA: Diagnosis not present

## 2017-05-16 NOTE — Telephone Encounter (Signed)
Place peripheral IV as pt needs to receive final doses of abx

## 2017-05-16 NOTE — Telephone Encounter (Signed)
Olivia Mackie with [facility] Called and stated that she was at patient home and cleaning PICC line and it came out. It is scheduled to come out tomorrow. Nurse stated patient received her am antibiotic dose. Nurse not sure what to do. Please Advise.

## 2017-05-16 NOTE — Progress Notes (Signed)
Careteam: Patient Care Team: Gildardo Cranker, DO as PCP - General (Internal Medicine) Edrick Kins, DPM as Consulting Physician (Podiatry) Dorothy Spark, MD as Consulting Physician (Cardiology)  Advanced Directive information    Allergies  Allergen Reactions  . Penicillins Itching, Rash and Other (See Comments)    Tolerated unasyn, zosyn & cephalosporins in the past.  Has patient had a PCN reaction causing immediate rash, facial/tongue/throat swelling, SOB or lightheadedness with hypotension: Yes Has patient had a PCN reaction causing severe rash involving mucus membranes or skin necrosis: Unk Has patient had a PCN reaction that required hospitalization: Unk Has patient had a PCN reaction occurring within the last 10 years: Unk If all of the above answers are "NO", then may proceed with Cephalosporins  . Other     Patient tolerates amoxicillin and cephalosporins    Chief Complaint  Patient presents with  . Acute Visit    Pt is being seen due to having a piece of cotton stuck in right ear for 2 days. Pt had put sweet oil and cotton in ears due to ear ache.   . Other    Daughter in room     HPI: Patient is a 84 y.o. female seen in the office today to look in ears 3 days ago she put sweet oil and cotton in both ears. Then 2 days ago told her daughter about it and stated she was not able to get the cotton out. Her daughter got it out of left ear but could not get it out of her right ear but reports it was too deep and she did not feel comfortable.  Reports she was putting sweet oil in her ears due to pain and itching.  Pt points to pain below and behind ears. She has chronic neck pain due to OA.  Pt continues on primaxin 300mg  IV q6hrs x 10 days; completes last dose tomorrow. Dr Eulas Post had ordered PICC line to be dc'd after complete but per daughter home health needs another order. Urinary symptoms have improved significantly; no longer having pain.  Review of Systems:    Review of Systems  Constitutional: Negative for chills and fever.  HENT: Positive for ear pain and hearing loss. Negative for congestion, ear discharge and sore throat.   Respiratory: Negative for shortness of breath.   Cardiovascular: Negative for chest pain.  Genitourinary: Negative for dysuria.  Musculoskeletal: Positive for neck pain.  Skin: Negative for itching and rash.    Past Medical History:  Diagnosis Date  . Acute respiratory failure ([place])   . Anemia    previous blood transfusions  . Arthritis    "all over"  . Asthma   . Bradycardia    requiring previous d/c of BB and reduction of amiodarone  . CAD (coronary artery disease)    nonobstructive per notes  . Chronic diastolic CHF (congestive heart failure) ([place])   . CKD (chronic kidney disease), stage III ([place])   . Complication of blood transfusion    "got the wrong blood type at [facility] in ~ 2015; no adverse reaction that we are aware of"/daughter, [name] (01/27/2016)  . COPD (chronic obstructive pulmonary disease) ([place])   . Depression    "light case"  . DVT (deep venous thrombosis) ([place]) 01/2016   a. LLE DVT 01/2016 - switched from Eliquis to Coumadin.  . Gastric stenosis    a. s/p stomach tube  . GERD (gastroesophageal reflux disease)   . History of blood transfusion    "  several" (01/27/2016)  . History of stomach ulcers   . Hyperlipidemia   . Hypertension   . Hypothyroidism   . Paraesophageal hernia   . Perforated gastric ulcer ([place])   . Seasonal allergies   . SIADH (syndrome of inappropriate ADH production) ([place])    [name] 01/10/2015  . Small bowel obstruction ([place])    "I don't know how many" (01/11/2015)  . Stroke ([place])    "light one"  . Type II diabetes mellitus ([place])    "related to prednisone use  for > 20 yr; once predinose stopped; no more DM RX" (01/27/2016)  . UTI (urinary tract infection) 02/08/2016  . Ventral hernia with bowel obstruction    Past Surgical History:  Procedure Laterality  Date  . ABDOMINAL AORTOGRAM N/A 09/21/2016   Procedure: Abdominal Aortogram;  Surgeon: Waynetta Sandy, MD;  Location: [facility];  Service: Cardiovascular;  Laterality: N/A;  . AMPUTATION Left 09/25/2016   Procedure: AMPUTATION DIGIT- LEFT 2ND AND 3RD TOES;  Surgeon: Rosetta Posner, MD;  Location: [facility];  Service: Vascular;  Laterality: Left;  . CATARACT EXTRACTION W/ INTRAOCULAR LENS  IMPLANT, BILATERAL    . CHOLECYSTECTOMY OPEN    . COLECTOMY    . ESOPHAGOGASTRODUODENOSCOPY N/A 01/19/2014   Procedure: ESOPHAGOGASTRODUODENOSCOPY (EGD);  Surgeon: Irene Shipper, MD;  Location: [facility];  Service: Endoscopy;  Laterality: N/A;  . ESOPHAGOGASTRODUODENOSCOPY N/A 01/20/2014   Procedure: ESOPHAGOGASTRODUODENOSCOPY (EGD);  Surgeon: Irene Shipper, MD;  Location: [facility];  Service: Endoscopy;  Laterality: N/A;  . ESOPHAGOGASTRODUODENOSCOPY N/A 03/19/2014   Procedure: ESOPHAGOGASTRODUODENOSCOPY (EGD);  Surgeon: Milus Banister, MD;  Location: [facility];  Service: Endoscopy;  Laterality: N/A;  . ESOPHAGOGASTRODUODENOSCOPY N/A 07/08/2015   Procedure: ESOPHAGOGASTRODUODENOSCOPY (EGD);  Surgeon: Doran Stabler, MD;  Location: [facility];  Service: Endoscopy;  Laterality: N/A;  . ESOPHAGOGASTRODUODENOSCOPY (EGD) WITH PROPOFOL N/A 09/15/2015   Procedure: ESOPHAGOGASTRODUODENOSCOPY (EGD) WITH PROPOFOL;  Surgeon: Manus Gunning, MD;  [facility]: [facility];  Service: Gastroenterology;  Laterality: N/A;  . GASTROJEJUNOSTOMY     hx/notes 01/10/2015  . GASTROJEJUNOSTOMY N/A 09/23/2015   Procedure: OPEN GASTROJEJUNOSTOMY TUBE PLACEMENT;  Surgeon: Arta Bruce Kinsinger, MD;  Location: [facility];  Service: General;  Laterality: N/A;  . GLAUCOMA SURGERY Bilateral   . HERNIA REPAIR  2015  . IR CM INJ ANY COLONIC TUBE W/FLUORO  01/05/2017  . IR GENERIC HISTORICAL  01/07/2016   IR GJ TUBE CHANGE 01/07/2016 Jacqulynn Cadet, MD [facility]  . IR GENERIC HISTORICAL  01/27/2016   IR MECH  REMOV OBSTRUC MAT ANY COLON TUBE W/FLUORO 01/27/2016 Markus Daft, MD [facility]  . IR GENERIC HISTORICAL  02/07/2016   IR PATIENT EVAL TECH 0-60 MINS Darrell K Allred, PA-C [facility]  . IR GENERIC HISTORICAL  02/08/2016   IR GJ TUBE CHANGE 02/08/2016 Greggory Keen, MD [facility]  . IR GENERIC HISTORICAL  01/06/2016   IR GJ TUBE CHANGE 01/06/2016 [facility] OUT REF  . IR GENERIC HISTORICAL  05/02/2016   IR CM INJ ANY COLONIC TUBE W/FLUORO 05/02/2016 Arne Cleveland, MD [facility]  . IR GENERIC HISTORICAL  05/15/2016   IR GJ TUBE CHANGE 05/15/2016 Sandi Mariscal, MD [facility]  . IR GENERIC HISTORICAL  06/28/2016   IR GJ TUBE CHANGE 06/28/2016 [facility]  . IR GJ TUBE CHANGE  02/20/2017  . IR GJ TUBE CHANGE  05/10/2017  . IR PATIENT EVAL TECH 0-60 MINS  10/19/2016  . IR PATIENT EVAL TECH 0-60 MINS  12/25/2016  . IR PATIENT EVAL TECH 0-60 MINS  01/29/2017  . IR PATIENT EVAL TECH 0-60 MINS  04/04/2017  . IR PATIENT EVAL TECH 0-60 MINS  04/30/2017  . IR REPLC DUODEN/JEJUNO TUBE PERCUT W/FLUORO  11/14/2016  . LAPAROTOMY N/A 01/20/2015   Procedure: EXPLORATORY LAPAROTOMY;  Surgeon: Coralie Keens, MD;  Location: [facility];  Service: General;  Laterality: N/A;  . LOWER EXTREMITY ANGIOGRAPHY Left 09/21/2016   Procedure: Lower Extremity Angiography;  Surgeon: Waynetta Sandy, MD;  Location: [facility];  Service: Cardiovascular;  Laterality: Left;  . LYSIS OF ADHESION N/A 01/20/2015   Procedure: LYSIS OF ADHESIONS < 1 HOUR;  Surgeon: Coralie Keens, MD;  Location: [facility];  Service: General;  Laterality: N/A;  . PERIPHERAL VASCULAR BALLOON ANGIOPLASTY Left 09/21/2016   Procedure: Peripheral Vascular Balloon Angioplasty;  Surgeon: Waynetta Sandy, MD;  Location: [facility];  Service: Cardiovascular;  Laterality: Left;  drug coated balloon  . TONSILLECTOMY    . TUBAL LIGATION    . VENTRAL HERNIA REPAIR  2015   incarcerated ventral hernia ([facility] 09/2013)/notes 01/10/2015    Social History:   reports that  has never smoked. She has quit using smokeless tobacco. Her smokeless tobacco use included snuff. She reports that she does not drink alcohol or use drugs.  Family History  Problem Relation Age of Onset  . Stroke Mother   . Hypertension Mother   . Diabetes Brother   . Glaucoma Son   . Glaucoma Son   . Heart attack Neg Hx     Medications: Patient's Medications  New Prescriptions   No medications on file  Previous Medications   ACETAMINOPHEN (TYLENOL) 500 MG TABLET    Take 1,000 mg by mouth every 6 (six) hours as needed (pain).   AMINO ACIDS-PROTEIN HYDROLYS PO    Take 30 mLs by mouth 2 (two) times daily.   ATORVASTATIN (LIPITOR) 10 MG TABLET    TAKE ONE TABLET BY MOUTH ONCE DAILY   B COMPLEX VITAMINS TABLET    Take 1 tablet by mouth daily.    CALCIUM-VITAMIN D (OSCAL WITH D) 500-200 MG-UNIT PER TABLET    Take 1 tablet by mouth daily with breakfast.    CARVEDILOL (COREG) 6.25 MG TABLET    Take 2 tabs po in AM and 1 tab po in PM for blood pressure   CYCLOSPORINE (RESTASIS) 0.05 % OPHTHALMIC EMULSION    USE 1 DROP INTO BOTH EYES TWICE DAILY   FEEDING SUPPLEMENT (OSMOLITE 1 CAL) LIQD    Take 711 mLs by mouth at bedtime.    FERROUS SULFATE 325 (65 FE) MG TABLET    Take one tablet by mouth once daily with breakfast   FLUTICASONE (FLONASE) 50 MCG/ACT NASAL SPRAY    Place 2 sprays into both nostrils daily.   FUROSEMIDE (LASIX) 40 MG TABLET    Take 1 tablet (40 mg total) by mouth daily.   GABAPENTIN (NEURONTIN) 300 MG CAPSULE    TAKE 2 CAPSULES (600 MG TOTAL) BY MOUTH 3 (THREE) TIMES DAILY.   GENTAMICIN CREAM (GARAMYCIN) 0.1 %    Apply 1 application topically 3 (three) times daily.   HYDRALAZINE (APRESOLINE) 50 MG TABLET    Take 1 tablet (50 mg total) by mouth 3 (three) times daily.   INSULIN ASPART (NOVOLOG FLEXPEN) 100 UNIT/ML FLEXPEN    Take three times daily before each meal based on sliding scale 140-199( 2 units), 200-250 (4 units), 251-299 (6 units),  300-349 (8  units), > 350(10 units)   INSULIN GLARGINE (LANTUS) 100 UNIT/ML SOLOSTAR PEN    Injection 0.1 ml (10 units) once daily to control blood sugar. Dx E11.9   INSULIN PEN NEEDLE (B-D UF III MINI PEN NEEDLES) 31G X 5 MM MISC    Use twice daily with giving insulin injections. Dx E11.9   IPRATROPIUM-ALBUTEROL (DUONEB) 0.5-2.5 (3) MG/3ML SOLN    Take 3 mLs by nebulization every 6 (six) hours as needed (sob). Use 3 times daily x 4 days then every 6 hours as needed.   ISOSORBIDE MONONITRATE (IMDUR) 30 MG 24 HR TABLET    Take 2 tablets (60 mg total) by mouth daily.   LATANOPROST (XALATAN) 0.005 % OPHTHALMIC SOLUTION    Place 1 drop into both eyes at bedtime.   LEVALBUTEROL (XOPENEX HFA) 45 MCG/ACT INHALER    Inhale 2 puffs into the lungs every 6 (six) hours as needed for wheezing or shortness of breath.   LEVOTHYROXINE (SYNTHROID, LEVOTHROID) 137 MCG TABLET    TAKE 1 TABLET (137 MCG TOTAL) BY MOUTH DAILY BEFORE BREAKFAST.   MALTODEXTRIN-XANTHAN GUM (RESOURCE THICKENUP CLEAR) POWD    Take 120 g by mouth as needed (use to thicken liquids.).   MOMETASONE-FORMOTEROL (DULERA) 200-5 MCG/ACT AERO    Inhale 2 puffs into the lungs 2 (two) times daily.   MULTIPLE VITAMIN (MULTIVITAMIN WITH MINERALS) TABS TABLET    Take 1 tablet by mouth daily.   NYSTATIN (MYCOSTATIN/NYSTOP) POWDER    Apply topically 3 (three) times daily.   OXYCODONE (ROXICODONE) 5 MG IMMEDIATE RELEASE TABLET    Take 1 tablet (5 mg total) by mouth every 4 (four) hours as needed for severe pain. May take 1/2 tab q4hrs as needed for mild pain.   OXYGEN    Inhale 2 L into the lungs at bedtime.   PANTOPRAZOLE (PROTONIX) 40 MG TABLET    Take 1 tablet (40 mg total) 2 (two) times daily by mouth.   POLYETHYL GLYCOL-PROPYL GLYCOL 0.4-0.3 % SOLN    Place 1 drop into both eyes 4 (four) times daily.    SERTRALINE (ZOLOFT) 100 MG TABLET    TAKE ONE TABLET BY MOUTH ONCE DAILY FOR DEPRESSION   WARFARIN (COUMADIN) 2.5 MG TABLET    TAKE AS DIRECTED BY COUMADIN  CLINIC.   WATER FOR IRRIGATION, STERILE (FREE WATER) SOLN    Place 60 mLs into feeding tube 3 (three) times daily.    WHITE PETROLATUM-MINERAL OIL (SYSTANE NIGHTTIME) OINT    Apply 1 application to eye at bedtime. Both eyes  Modified Medications   No medications on file  Discontinued Medications   NITROFURANTOIN, MACROCRYSTAL-MONOHYDRATE, (MACROBID) 100 MG CAPSULE    Take 1 capsule (100 mg total) by mouth 2 (two) times daily.   NYSTATIN (MYCOSTATIN) 100000 UNIT/ML SUSPENSION    5 ml swish and spit 4 times daily for 1 week then as needed until resolves     Physical Exam:  Vitals:   05/16/17 1034  BP: (!) 148/74  Pulse: 73  Temp: 98.2 F (36.8 C)  TempSrc: Oral  SpO2: 97%  Weight: 156 lb (70.8 kg)  Height: 5\' 5"  (1.651 m)   Body mass index is 25.96 kg/m.  Physical Exam  Constitutional: She is oriented to person, place, and time. She appears well-developed and well-nourished.  HENT:  Head: Normocephalic and atraumatic.  Right Ear: External ear and ear canal normal.  Left Ear: Tympanic membrane, external ear and ear canal normal.  Nose: Nose normal.  Mouth/Throat: Oropharynx is  clear and moist.  Wax noted against TM on right, no cotton noted in bilateral ear  Eyes: Pupils are equal, round, and reactive to light.  Cardiovascular: Normal rate and regular rhythm.  Murmur heard. Pulmonary/Chest: Effort normal and breath sounds normal.  Musculoskeletal: She exhibits no edema.       Cervical back: She exhibits tenderness. She exhibits normal range of motion.  Neurological: She is alert and oriented to person, place, and time.  Skin: Skin is warm and dry.    Labs reviewed: Basic Metabolic Panel: Recent Labs    06/08/16 1523  07/21/16 1124  08/16/16 0616  08/18/16 0517  09/25/16 0524  02/21/17 0440 02/22/17 0436 02/23/17 0330  NA 135   < >  --    < > 141   < > 135   < > 136   < > 144 141 137  K 4.4   < >  --    < > 4.2   < > 4.0   < > 4.6   < > 4.4 4.2 4.1  CL 99*   <  >  --    < > 107   < > 101   < > 105   < > 114* 114* 111  CO2 29   < >  --    < > 25   < > 24   < > 25   < > 26 22 22   GLUCOSE 148*   < >  --    < > 350*   < > 154*   < > 106*   < > 138* 146* 142*  BUN 54*   < >  --    < > 54*   < > 49*   < > 32*   < > 53* 32* 24*  CREATININE 1.97*   < >  --    < > 1.04*   < > 0.96   < > 1.03*   < > 1.30* 1.03* 0.99  CALCIUM 8.7*   < >  --    < > 9.4   < > 9.1   < > 8.7*   < > 9.0 8.9 9.0  MG 1.9   < >  --    < > 1.9  --  1.9  --   --   --   --  1.9 1.6*  PHOS  --    < >  --    < > 2.2*  --   --   --  3.5  --   --   --  2.1*  TSH 10.017*  --  4.97*  --   --   --  0.670  --   --   --   --   --   --    < > = values in this interval not displayed.   Liver Function Tests: Recent Labs    09/26/16 0435 10/03/16 1610 11/02/16 1710  AST 25 20 24   ALT 31 25 19   ALKPHOS 57 73 58  BILITOT 0.4 0.4 0.5  PROT 5.4* 7.1 7.0  ALBUMIN 2.0* 3.2* 3.5   No results for input(s): LIPASE, AMYLASE in the last 8760 hours. No results for input(s): AMMONIA in the last 8760 hours. CBC: Recent Labs    02/21/17 0440 02/22/17 0436 02/23/17 0330  WBC 6.4 6.3 7.4  NEUTROABS 3.3 3.3 4.4  HGB 8.8* 8.9* 9.3*  HCT 27.0* 26.9* 27.8*  MCV 94.1 93.4 91.7  PLT  233 238 250   Lipid Panel: No results for input(s): CHOL, HDL, LDLCALC, TRIG, CHOLHDL, LDLDIRECT in the last 8760 hours. TSH: Recent Labs    06/08/16 1523 07/21/16 1124 08/18/16 0517  TSH 10.017* 4.97* 0.670   A1C: Lab Results  Component Value Date   HGBA1C 7.7 (H) 08/16/2016     Assessment/Plan 1. Chronic neck pain -encouraged to use muscle rub and tylenol PRN to neck to help with pain  2. UTI due to extended-spectrum beta lactamase (ESBL) producing Escherichia coli -completes IV primaxin 300mg  tomorrow, okay to DC PICC line once complete- Rx written to home health for order.  3. Cerumen debris on tympanic membrane of right ear -educated on not putting anything in ears. Pain from ear most likely coming  from OA of neck. Would like to go to ENT to have wax fully removed.  - Ambulatory referral to ENT  Next appt: 06/27/2017 [name]. Harle Battiest  [facility] [phone] 8 am - 5 pm) [phone] (after hours)

## 2017-05-16 NOTE — Telephone Encounter (Signed)
Shannon [name] with [facility] notified and agreed to place.

## 2017-05-17 ENCOUNTER — Telehealth: Payer: Self-pay

## 2017-05-17 DIAGNOSIS — Z1612 Extended spectrum beta lactamase (ESBL) resistance: Principal | ICD-10-CM

## 2017-05-17 DIAGNOSIS — B9629 Other Escherichia coli [E. coli] as the cause of diseases classified elsewhere: Secondary | ICD-10-CM

## 2017-05-17 DIAGNOSIS — N390 Urinary tract infection, site not specified: Secondary | ICD-10-CM

## 2017-05-17 MED ORDER — NITROFURANTOIN MACROCRYSTAL 100 MG PO CAPS: 100.0000 mg | ORAL_CAPSULE | Freq: Two times a day (BID) | ORAL | 0 refills | Status: DC

## 2017-05-17 NOTE — Telephone Encounter (Addendum)
Shannon [name] with home health called to inform Dr.Carter of complications with antibiotic. Peripheral IV was placed as directed yesterday evening. IV infiltrated and the on call provider was contacted [facility] Wynelle Cleveland, NP), it was advised that patient to go to the hospital for PICC line placement.  Patient's daughter refused for she has a disabled child at home and was unable to leave her alone last night.  Per Becky Sax patient's daughter is willing to take patient to get PICC line placement if we can set up to decrease wait time.   Becky Sax states an oral medication would probably be the best option for completion of antibiotic for it is the lease complicated option  Please advise

## 2017-05-17 NOTE — Telephone Encounter (Signed)
1 day left (3 remaining doses)

## 2017-05-17 NOTE — Telephone Encounter (Signed)
Spoke with [name], discussed Dr.Carter's response. [name] verbalized understanding   I called Sonja with Home Health to inform her of course of action.   Medication pended for review and confirmation from Gildardo Cranker, DO prior to sending to pharmacy. I was unable to pull up a listing Nitrofurantoin ER

## 2017-05-17 NOTE — Telephone Encounter (Signed)
Nitrofurantoin ER 100mg  #6 take 1 tab via FT q12hr x 3 days, no RF. Please note that infection not as sensitive to po med which is why need additional days of therapy

## 2017-05-17 NOTE — Telephone Encounter (Signed)
How many days does she have left for IV abx?

## 2017-05-20 ENCOUNTER — Other Ambulatory Visit: Payer: Self-pay | Admitting: Internal Medicine

## 2017-05-21 ENCOUNTER — Ambulatory Visit (INDEPENDENT_AMBULATORY_CARE_PROVIDER_SITE_OTHER): Payer: Medicare Other | Admitting: Podiatry

## 2017-05-21 DIAGNOSIS — H903 Sensorineural hearing loss, bilateral: Secondary | ICD-10-CM | POA: Diagnosis not present

## 2017-05-21 DIAGNOSIS — H9113 Presbycusis, bilateral: Secondary | ICD-10-CM | POA: Diagnosis not present

## 2017-05-21 DIAGNOSIS — E0842 Diabetes mellitus due to underlying condition with diabetic polyneuropathy: Secondary | ICD-10-CM

## 2017-05-21 DIAGNOSIS — L97512 Non-pressure chronic ulcer of other part of right foot with fat layer exposed: Secondary | ICD-10-CM

## 2017-05-21 DIAGNOSIS — I70235 Atherosclerosis of native arteries of right leg with ulceration of other part of foot: Secondary | ICD-10-CM

## 2017-05-21 MED ORDER — COLLAGENASE 250 UNIT/GM EX OINT: 1.0000 "application " | TOPICAL_OINTMENT | Freq: Every day | CUTANEOUS | 1 refills | Status: DC

## 2017-05-22 ENCOUNTER — Other Ambulatory Visit: Payer: Self-pay | Admitting: Cardiology

## 2017-05-23 NOTE — Progress Notes (Signed)
Subjective:  84 year old female presenting today for follow up evaluation of an ulceration of the right great toe. She states the ulcer has improved. She denies any new complaints at this time. Patient presents today for further treatment and evaluation.   Past Medical History:  Diagnosis Date  . Acute respiratory failure ([place])   . Anemia    previous blood transfusions  . Arthritis    "all over"  . Asthma   . Bradycardia    requiring previous d/c of BB and reduction of amiodarone  . CAD (coronary artery disease)    nonobstructive per notes  . Chronic diastolic CHF (congestive heart failure) ([place])   . CKD (chronic kidney disease), stage III ([place])   . Complication of blood transfusion    "got the wrong blood type at [facility] in ~ 2015; no adverse reaction that we are aware of"/daughter, [name] (01/27/2016)  . COPD (chronic obstructive pulmonary disease) ([place])   . Depression    "light case"  . DVT (deep venous thrombosis) ([place]) 01/2016   a. LLE DVT 01/2016 - switched from Eliquis to Coumadin.  . Gastric stenosis    a. s/p stomach tube  . GERD (gastroesophageal reflux disease)   . History of blood transfusion    "several" (01/27/2016)  . History of stomach ulcers   . Hyperlipidemia   . Hypertension   . Hypothyroidism   . Paraesophageal hernia   . Perforated gastric ulcer ([place])   . Seasonal allergies   . SIADH (syndrome of inappropriate ADH production) ([place])    [name] 01/10/2015  . Small bowel obstruction ([place])    "I don't know how many" (01/11/2015)  . Stroke ([place])    "light one"  . Type II diabetes mellitus ([place])    "related to prednisone use  for > 20 yr; once predinose stopped; no more DM RX" (01/27/2016)  . UTI (urinary tract infection) 02/08/2016  . Ventral hernia with bowel obstruction       Objective/Physical Exam General: The patient is alert and oriented x3 in no acute distress.  Dermatology:  Wound #1 noted to the right great toe measuring 2.0 x 1.5 x  0.2 cm (LxWxD).   To the noted ulceration(s), there is no eschar. There is a moderate amount of slough, fibrin, and necrotic tissue noted. Granulation tissue and wound base is red. There is a minimal amount of serosanguineous drainage noted. There is no exposed bone muscle-tendon ligament or joint. There is no malodor. Periwound integrity is intact. Skin is warm, dry and supple bilateral lower extremities.  Vascular: Palpable pedal pulses bilaterally. No edema or erythema noted. Capillary refill within normal limits.  Neurological: Epicritic and protective threshold absent bilaterally.   Musculoskeletal Exam: Range of motion within normal limits to all pedal and ankle joints bilateral. Muscle strength 5/5 in all groups bilateral.   Assessment: #1 ulceration of the right great toe secondary to diabetes mellitus #2 diabetes mellitus w/ peripheral neuropathy   Plan of Care:  #1 Patient was evaluated. #2 medically necessary excisional debridement including subcutaneous tissue was performed using a tissue nipper and a chisel blade. Excisional debridement of all the necrotic nonviable tissue down to healthy bleeding viable tissue was performed with post-debridement measurements same as pre-. #3 the wound was cleansed and dry sterile dressing applied. #4 Discontinue using gentamicin cream. Prescription for Santyl ointment provided to patient to be used daily with a dry sterile dressing.  #5 Continue wearing post op shoe.  #6 Return to clinic  in 4 weeks.    Edrick Kins, DPM [facility]  Dr. Edrick Kins, [address]                                        [place], [place] [address]                Office [phone]  Fax [phone]

## 2017-05-25 ENCOUNTER — Telehealth: Payer: Self-pay | Admitting: *Deleted

## 2017-05-25 NOTE — Telephone Encounter (Signed)
Faxed Santyl order to [place].

## 2017-05-25 NOTE — Telephone Encounter (Signed)
Left message informing Shannon [name] I would call again on the line I thought she had called, that I had accidentally dropped her call, but did have some information.

## 2017-05-25 NOTE — Telephone Encounter (Signed)
I informed Shannon [name] the cream Dr. Amalia Hailey had ordered needed to be sent to a specialty pharmacy for PA, that until the approval had be received pt could use Medihoney, an OTC product.

## 2017-05-25 NOTE — Telephone Encounter (Signed)
Shannon [name] states pt's cream prescribed last visit needs a prior authorization.

## 2017-05-27 ENCOUNTER — Other Ambulatory Visit: Payer: Self-pay | Admitting: Internal Medicine

## 2017-05-28 ENCOUNTER — Ambulatory Visit (INDEPENDENT_AMBULATORY_CARE_PROVIDER_SITE_OTHER): Payer: Medicare Other | Admitting: Pharmacist

## 2017-05-28 DIAGNOSIS — Z5181 Encounter for therapeutic drug level monitoring: Secondary | ICD-10-CM | POA: Diagnosis not present

## 2017-05-28 DIAGNOSIS — I4892 Unspecified atrial flutter: Secondary | ICD-10-CM | POA: Diagnosis not present

## 2017-05-28 LAB — POCT INR: INR: 4

## 2017-05-28 NOTE — Patient Instructions (Signed)
Description   Skip today's dosage of Coumadin,  then start taking 2 tablets daily except 1 tablet on Fridays.   Recheck INR in 2 weeks.  Call us with any medication changes or concerns [phone]. Main # [phone].

## 2017-05-29 ENCOUNTER — Other Ambulatory Visit: Payer: Self-pay | Admitting: *Deleted

## 2017-05-29 DIAGNOSIS — K922 Gastrointestinal hemorrhage, unspecified: Secondary | ICD-10-CM

## 2017-05-29 MED ORDER — PANTOPRAZOLE SODIUM 40 MG PO TBEC: 40.0000 mg | DELAYED_RELEASE_TABLET | Freq: Two times a day (BID) | ORAL | 3 refills | Status: DC

## 2017-05-29 NOTE — Telephone Encounter (Signed)
Daughter, [name] called and requested refill to be sent to Pharmacy. Faxed.

## 2017-06-04 ENCOUNTER — Encounter: Payer: Self-pay | Admitting: Podiatry

## 2017-06-04 ENCOUNTER — Ambulatory Visit (INDEPENDENT_AMBULATORY_CARE_PROVIDER_SITE_OTHER): Payer: Medicare Other | Admitting: Podiatry

## 2017-06-04 DIAGNOSIS — I70235 Atherosclerosis of native arteries of right leg with ulceration of other part of foot: Secondary | ICD-10-CM

## 2017-06-04 DIAGNOSIS — L97512 Non-pressure chronic ulcer of other part of right foot with fat layer exposed: Secondary | ICD-10-CM

## 2017-06-04 DIAGNOSIS — E0842 Diabetes mellitus due to underlying condition with diabetic polyneuropathy: Secondary | ICD-10-CM

## 2017-06-04 NOTE — Progress Notes (Signed)
Subjective:  84 year old female presenting today for follow up evaluation of an ulceration of the right great toe.  Patient believes that the ulcer to the right great toe is unchanged.  Just over the past week she has noticed also that she is developing an ulcer to the third toe of the right foot.  She is concerned for infection.  She presents today with her daughter.  Patient denies any change in shoe gear or daily activity in regards to the right foot.  She presents today for further treatment evaluation  Past Medical History:  Diagnosis Date  . Acute respiratory failure ([place])   . Anemia    previous blood transfusions  . Arthritis    "all over"  . Asthma   . Bradycardia    requiring previous d/c of BB and reduction of amiodarone  . CAD (coronary artery disease)    nonobstructive per notes  . Chronic diastolic CHF (congestive heart failure) ([place])   . CKD (chronic kidney disease), stage III ([place])   . Complication of blood transfusion    "got the wrong blood type at [facility] in ~ 2015; no adverse reaction that we are aware of"/daughter, [name] (01/27/2016)  . COPD (chronic obstructive pulmonary disease) ([place])   . Depression    "light case"  . DVT (deep venous thrombosis) ([place]) 01/2016   a. LLE DVT 01/2016 - switched from Eliquis to Coumadin.  . Gastric stenosis    a. s/p stomach tube  . GERD (gastroesophageal reflux disease)   . History of blood transfusion    "several" (01/27/2016)  . History of stomach ulcers   . Hyperlipidemia   . Hypertension   . Hypothyroidism   . Paraesophageal hernia   . Perforated gastric ulcer ([place])   . Seasonal allergies   . SIADH (syndrome of inappropriate ADH production) ([place])    [name] 01/10/2015  . Small bowel obstruction ([place])    "I don't know how many" (01/11/2015)  . Stroke ([place])    "light one"  . Type II diabetes mellitus ([place])    "related to prednisone use  for > 20 yr; once predinose stopped; no more DM RX" (01/27/2016)  . UTI  (urinary tract infection) 02/08/2016  . Ventral hernia with bowel obstruction       Objective/Physical Exam General: The patient is alert and oriented x3 in no acute distress.  Dermatology:  Wound #1 noted to the right great toe measuring 2.0 x 1.5 x 0.2 cm (LxWxD).  This ulceration appears unchanged  To the noted ulceration(s), there is no eschar. There is a moderate amount of slough, fibrin, and necrotic tissue noted. Granulation tissue and wound base is red. There is a minimal amount of serosanguineous drainage noted. There is no exposed bone muscle-tendon ligament or joint. There is no malodor. Periwound integrity is intact. Skin is warm, dry and supple bilateral lower extremities.  New ulceration noted encompassing the third toe of the right foot.  The toe is cold to touch with maceration encompassing the third digit.  This appears to be localized to the toe only.  No erythema or edema noted.  No malodor noted.  Upon debridement of the superficial skin there is an underlying ulceration compassing the entirety of the third digit right foot.  Vascular: NonPalpable pedal pulses significant in the right lower extremity. No edema or erythema noted.  Extremities are cold to touch  Neurological: Epicritic and protective threshold absent bilaterally.   Musculoskeletal Exam: History of first and second  toe amputations left foot  Assessment: #1 ulceration of the right great toe and third digit secondary to diabetes mellitus #2 diabetes mellitus w/ peripheral neuropathy #3 acute ischemic changes third digit right foot #4 acute critical limb ischemia right foot  Plan of Care:  #1 Patient was evaluated. #2 medically necessary excisional debridement including subcutaneous tissue was performed using a tissue nipper and a chisel blade. Excisional debridement of all the necrotic nonviable tissue down to healthy bleeding viable tissue was performed with post-debridement measurements same as pre-. #3  the wound was cleansed and dry sterile dressing applied. #4  Recommend that the patient discontinue gentamicin cream apply Betadine daily to the digits of the right foot.  #5 I explained today to the patient's issues are arising from an acute ischemia of the digits.  Today we will refer the patient for arterial Doppler and possible vascular referral #6 return to clinic in 2 weeks  Edrick Kins, DPM [facility]  Dr. Edrick Kins, [place]                                        [place], [place] [address]                Office [phone]  Fax [phone]

## 2017-06-05 ENCOUNTER — Telehealth: Payer: Self-pay | Admitting: *Deleted

## 2017-06-05 DIAGNOSIS — E0859 Diabetes mellitus due to underlying condition with other circulatory complications: Secondary | ICD-10-CM

## 2017-06-05 DIAGNOSIS — L97512 Non-pressure chronic ulcer of other part of right foot with fat layer exposed: Secondary | ICD-10-CM

## 2017-06-05 NOTE — Telephone Encounter (Signed)
Faxed referral to [facility] - Main fax. Faxed dopplers to [facility] - Doppler lab.

## 2017-06-05 NOTE — Telephone Encounter (Signed)
-----   Message from Edrick Kins, DPM sent at 06/04/2017  5:52 PM EST ----- Regarding:  arterial Doppler and vascular referral Please order arterial Doppler right lower extremity with vascular referral.   Diagnosis: Critical limb ischemia right lower extremity.  Ischemic gangrene third digit right foot.  Nonhealing ulcer right great toe  Thanks, Dr. Amalia Hailey

## 2017-06-06 ENCOUNTER — Encounter (HOSPITAL_COMMUNITY): Payer: Self-pay | Admitting: Emergency Medicine

## 2017-06-06 ENCOUNTER — Emergency Department (HOSPITAL_COMMUNITY): Payer: Medicare Other

## 2017-06-06 ENCOUNTER — Ambulatory Visit (HOSPITAL_COMMUNITY)
Admission: RE | Admit: 2017-06-06 | Discharge: 2017-06-06 | Disposition: A | Discharge status: Home / Self Care | Payer: Medicare Other | Source: Ambulatory Visit | Attending: Radiology | Admitting: Radiology

## 2017-06-06 ENCOUNTER — Other Ambulatory Visit: Payer: Self-pay

## 2017-06-06 ENCOUNTER — Emergency Department (HOSPITAL_COMMUNITY)
Admission: EM | Admit: 2017-06-06 | Discharge: 2017-06-06 | Disposition: A | Discharge status: Home / Self Care | Payer: Medicare Other | Attending: Emergency Medicine | Admitting: Emergency Medicine

## 2017-06-06 ENCOUNTER — Other Ambulatory Visit (HOSPITAL_COMMUNITY): Payer: Self-pay | Admitting: Radiology

## 2017-06-06 DIAGNOSIS — I2510 Atherosclerotic heart disease of native coronary artery without angina pectoris: Secondary | ICD-10-CM | POA: Diagnosis not present

## 2017-06-06 DIAGNOSIS — R079 Chest pain, unspecified: Secondary | ICD-10-CM | POA: Diagnosis not present

## 2017-06-06 DIAGNOSIS — Z931 Gastrostomy status: Secondary | ICD-10-CM | POA: Insufficient documentation

## 2017-06-06 DIAGNOSIS — K224 Dyskinesia of esophagus: Secondary | ICD-10-CM | POA: Insufficient documentation

## 2017-06-06 DIAGNOSIS — R52 Pain, unspecified: Secondary | ICD-10-CM

## 2017-06-06 DIAGNOSIS — I5032 Chronic diastolic (congestive) heart failure: Secondary | ICD-10-CM | POA: Insufficient documentation

## 2017-06-06 DIAGNOSIS — N179 Acute kidney failure, unspecified: Secondary | ICD-10-CM | POA: Insufficient documentation

## 2017-06-06 DIAGNOSIS — Z79899 Other long term (current) drug therapy: Secondary | ICD-10-CM | POA: Insufficient documentation

## 2017-06-06 DIAGNOSIS — J9811 Atelectasis: Secondary | ICD-10-CM | POA: Diagnosis not present

## 2017-06-06 DIAGNOSIS — J449 Chronic obstructive pulmonary disease, unspecified: Secondary | ICD-10-CM | POA: Insufficient documentation

## 2017-06-06 DIAGNOSIS — Z794 Long term (current) use of insulin: Secondary | ICD-10-CM | POA: Insufficient documentation

## 2017-06-06 DIAGNOSIS — N183 Chronic kidney disease, stage 3 (moderate): Secondary | ICD-10-CM | POA: Diagnosis not present

## 2017-06-06 DIAGNOSIS — I130 Hypertensive heart and chronic kidney disease with heart failure and stage 1 through stage 4 chronic kidney disease, or unspecified chronic kidney disease: Secondary | ICD-10-CM | POA: Diagnosis not present

## 2017-06-06 DIAGNOSIS — R633 Feeding difficulties, unspecified: Secondary | ICD-10-CM

## 2017-06-06 DIAGNOSIS — E1122 Type 2 diabetes mellitus with diabetic chronic kidney disease: Secondary | ICD-10-CM | POA: Diagnosis not present

## 2017-06-06 DIAGNOSIS — Z7901 Long term (current) use of anticoagulants: Secondary | ICD-10-CM | POA: Diagnosis not present

## 2017-06-06 DIAGNOSIS — K5730 Diverticulosis of large intestine without perforation or abscess without bleeding: Secondary | ICD-10-CM | POA: Diagnosis not present

## 2017-06-06 DIAGNOSIS — R140 Abdominal distension (gaseous): Secondary | ICD-10-CM | POA: Diagnosis not present

## 2017-06-06 LAB — BASIC METABOLIC PANEL
Anion gap: 9 (ref 5–15)
BUN: 73 mg/dL — ABNORMAL HIGH (ref 6–20)
CO2: 28 mmol/L (ref 22–32)
Calcium: 9.5 mg/dL (ref 8.9–10.3)
Chloride: 102 mmol/L (ref 101–111)
Creatinine, Ser: 1.67 mg/dL — ABNORMAL HIGH (ref 0.44–1.00)
GFR calc Af Amer: 30 mL/min — ABNORMAL LOW (ref >60)
GFR calc non Af Amer: 26 mL/min — ABNORMAL LOW (ref >60)
Glucose, Bld: 103 mg/dL — ABNORMAL HIGH (ref 65–99)
Potassium: 4.1 mmol/L (ref 3.5–5.1)
Sodium: 139 mmol/L (ref 135–145)

## 2017-06-06 LAB — CBC
HCT: 29 % — ABNORMAL LOW (ref 36.0–46.0)
Hemoglobin: 9.5 g/dL — ABNORMAL LOW (ref 12.0–15.0)
MCH: 29.8 pg (ref 26.0–34.0)
MCHC: 32.8 g/dL (ref 30.0–36.0)
MCV: 90.9 fL (ref 78.0–100.0)
Platelets: 282 10*3/uL (ref 150–400)
RBC: 3.19 MIL/uL — ABNORMAL LOW (ref 3.87–5.11)
RDW: 16.3 % — ABNORMAL HIGH (ref 11.5–15.5)
WBC: 7 10*3/uL (ref 4.0–10.5)

## 2017-06-06 LAB — I-STAT TROPONIN, ED
Troponin i, poc: 0.02 ng/mL (ref 0.00–0.08)
Troponin i, poc: 0 ng/mL (ref 0.00–0.08)

## 2017-06-06 LAB — BRAIN NATRIURETIC PEPTIDE: B Natriuretic Peptide: 260.5 pg/mL — ABNORMAL HIGH (ref 0.0–100.0)

## 2017-06-06 MED ORDER — GI COCKTAIL ~~LOC~~
30.0000 mL | Freq: Once | ORAL | Status: AC
  Administered 2017-06-06: 30 mL via ORAL
  Filled 2017-06-06: qty 30

## 2017-06-06 MED ORDER — SODIUM CHLORIDE 0.9 % IV BOLUS (SEPSIS)
500.0000 mL | Freq: Once | INTRAVENOUS | Status: AC
  Administered 2017-06-06: 500 mL via INTRAVENOUS

## 2017-06-06 NOTE — Discharge Instructions (Signed)
We saw you in the ER for the chest pain. All of our cardiac workup is normal, including labs, EKG and chest X-RAY are normal. CT scan is normal. We are not sure what is causing your discomfort, but we feel comfortable sending you home at this time. The workup in the ER is not complete, and you should follow up with your primary care doctor for further evaluation.  Please return to the ER if you have worsening chest pain, shortness of breath, pain radiating to your jaw, shoulder, or back, sweats or fainting. Otherwise see the Cardiologist or your primary care doctor as requested.

## 2017-06-06 NOTE — ED Notes (Addendum)
Per IR, staff was escorting patient out when patient began c/o new onset central chest tightness. IR reports patient was at routine G-tube assessment.

## 2017-06-06 NOTE — ED Provider Notes (Signed)
[facility] DEPT Provider Note   CSN: [id number] Arrival date & time: 06/06/17  1616     History   Chief Complaint Chief Complaint  Patient presents with  . Chest Pain    HPI Fiana Gladu is a 84 y.o. female.  HPI  84 year old comes in with chief complaint of chest discomfort.  Patient has history of nonobstructive CAD, CHF, pyloric stenosis requiring GJ tube, chronic dysphagia (liquid po only).  According to patient she has had some chest discomfort over the past few days.  Her symptoms have progressed, therefore she decided to come to the ED today.  Patient describes her chest pain as it midsternal, worse with swallowing.  Patient also has been having regurgitation, belching -and she has pain with that as well.  Patient denies any new cough.  Chest pain is nonradiating.  Patient denies any associated shortness of breath, sweating, nausea.   Past Medical History:  Diagnosis Date  . Acute respiratory failure ([place])   . Anemia    previous blood transfusions  . Arthritis    "all over"  . Asthma   . Bradycardia    requiring previous d/c of BB and reduction of amiodarone  . CAD (coronary artery disease)    nonobstructive per notes  . Chronic diastolic CHF (congestive heart failure) ([place])   . CKD (chronic kidney disease), stage III ([place])   . Complication of blood transfusion    "got the wrong blood type at [facility] in ~ 2015; no adverse reaction that we are aware of"/daughter, [name] (01/27/2016)  . COPD (chronic obstructive pulmonary disease) ([place])   . Depression    "light case"  . DVT (deep venous thrombosis) ([place]) 01/2016   a. LLE DVT 01/2016 - switched from Eliquis to Coumadin.  . Gastric stenosis    a. s/p stomach tube  . GERD (gastroesophageal reflux disease)   . History of blood transfusion    "several" (01/27/2016)  . History of stomach ulcers   . Hyperlipidemia   . Hypertension   . Hypothyroidism   . Paraesophageal hernia   .  Perforated gastric ulcer ([place])   . Seasonal allergies   . SIADH (syndrome of inappropriate ADH production) ([place])    [name] 01/10/2015  . Small bowel obstruction ([place])    "I don't know how many" (01/11/2015)  . Stroke ([place])    "light one"  . Type II diabetes mellitus ([place])    "related to prednisone use  for > 20 yr; once predinose stopped; no more DM RX" (01/27/2016)  . UTI (urinary tract infection) 02/08/2016  . Ventral hernia with bowel obstruction     Patient Active Problem List   Diagnosis Date Noted  . UTI due to extended-spectrum beta lactamase (ESBL) producing Escherichia coli 05/04/2017  . Acute kidney injury superimposed on CKD ([place]) 02/20/2017  . Complication of feeding tube ([place]) 02/20/2017  . Abdominal wall cellulitis 02/20/2017  . PEG tube malfunction ([place])   . COPD with asthma ([place]) 10/03/2016  . Chronic deep vein thrombosis (DVT) of tibial vein of left lower extremity ([place]) 10/03/2016  . Gangrene of toe of left foot ([place]) 09/15/2016  . Recurrent aspiration pneumonia ([place])   . Atherosclerosis of native arteries of the extremities with gangrene ([place]) 07/28/2016  . Calcium channel blocker adverse reaction 06/08/2016  . Other specified hypothyroidism   . CHF (NYHA class IV, ACC/AHA stage D) ([place])   . Shortness of breath   . Pressure injury of skin 03/11/2016  .  PEG (percutaneous endoscopic gastrostomy) status ([place]) 03/10/2016  . Encounter for therapeutic drug monitoring 02/16/2016  . Warfarin anticoagulation   . Chronic seasonal allergic rhinitis   . Atrial flutter ([place]) 01/24/2016  . Chronic diastolic CHF (congestive heart failure) ([place]) 01/24/2016  . Bilateral leg edema 12/29/2015  . Hypothyroidism due to medication   . Weakness   . Pyloric stenosis   . Pressure ulcer 09/15/2015  . Acute osteomyelitis of toe of right foot ([place])   . PVD (peripheral vascular disease) ([place])   . Adjustment disorder with anxiety 08/29/2015  . Diabetic ulcer of toe of right foot  associated with type 2 diabetes mellitus, with necrosis of muscle (Elma)   . Diverticulitis of large intestine without perforation or abscess without bleeding   . Colitis 08/26/2015  . Esophageal candidiasis ([place])   . SOB (shortness of breath)   . Encounter for long-term (current) use of other high-risk medications   . Dysphagia 07/05/2015  . Asthma with allergic rhinitis 06/04/2015  . Hypertensive heart disease 03/18/2015  . Malnutrition of moderate degree 01/28/2015  . Syncope 01/27/2015  . Symptomatic bradycardia 01/21/2015  . Hypotension due to drugs 01/21/2015  . Protein-calorie malnutrition, severe 01/12/2015  . Cerebrovascular disease 07/14/2014  . Diabetic polyneuropathy associated with type 2 diabetes mellitus ([place]) 07/14/2014  . Hyperlipidemia 07/14/2014  . Gastric outlet obstruction 05/21/2014  . Unstable gait 04/29/2014  . Slurred speech   . Gastroparesis   . Slow transit constipation 02/03/2014  . Carotid stenosis 02/03/2014  . Demand ischemia (Ra[place]) 01/21/2014  . Gastric bezoar 01/20/2014  . Anemia of chronic disease 01/18/2014  . Chronic kidney insufficiency, stage 3 (moderate) (HCC) 01/18/2014  . Depression 09/29/2013  . Essential hypertension, benign 09/22/2013  . GERD (gastroesophageal reflux disease) 09/22/2013  . Dyslipidemia 09/22/2013  . Diabetes mellitus type 2 in nonobese ([place]) 09/22/2013  . Persistent atrial fibrillation ([place]) 09/22/2013  . History of completed stroke 09/22/2013  . Diabetic neuropathy ([place]) 09/22/2013  . Chronic gastrojejunal ulcer with perforation ([place]) 09/07/2013  . Accelerated hypertension 09/07/2013  . Systemic lupus erythematosus (SLE) inhibitor ([place]arcona) 09/07/2013  . Other specified abnormal immunological findings in serum 09/07/2013    Past Surgical History:  Procedure Laterality Date  . ABDOMINAL AORTOGRAM N/A 09/21/2016   Procedure: Abdominal Aortogram;  Surgeon: Waynetta Sandy, MD;  Location: [facility];   Service: Cardiovascular;  Laterality: N/A;  . AMPUTATION Left 09/25/2016   Procedure: AMPUTATION DIGIT- LEFT 2ND AND 3RD TOES;  Surgeon: Rosetta Posner, MD;  Location: [facility];  Service: Vascular;  Laterality: Left;  . CATARACT EXTRACTION W/ INTRAOCULAR LENS  IMPLANT, BILATERAL    . CHOLECYSTECTOMY OPEN    . COLECTOMY    . ESOPHAGOGASTRODUODENOSCOPY N/A 01/19/2014   Procedure: ESOPHAGOGASTRODUODENOSCOPY (EGD);  Surgeon: Irene Shipper, MD;  Location: [facility];  Service: Endoscopy;  Laterality: N/A;  . ESOPHAGOGASTRODUODENOSCOPY N/A 01/20/2014   Procedure: ESOPHAGOGASTRODUODENOSCOPY (EGD);  Surgeon: Irene Shipper, MD;  Location: [facility];  Service: Endoscopy;  Laterality: N/A;  . ESOPHAGOGASTRODUODENOSCOPY N/A 03/19/2014   Procedure: ESOPHAGOGASTRODUODENOSCOPY (EGD);  Surgeon: Milus Banister, MD;  Location: [facility];  Service: Endoscopy;  Laterality: N/A;  . ESOPHAGOGASTRODUODENOSCOPY N/A 07/08/2015   Procedure: ESOPHAGOGASTRODUODENOSCOPY (EGD);  Surgeon: Doran Stabler, MD;  Location: [facility];  Service: Endoscopy;  Laterality: N/A;  . ESOPHAGOGASTRODUODENOSCOPY (EGD) WITH PROPOFOL N/A 09/15/2015   Procedure: ESOPHAGOGASTRODUODENOSCOPY (EGD) WITH PROPOFOL;  Surgeon: Manus Gunning, MD;  Location: [facility];  Service: Gastroenterology;  Laterality: N/A;  .  GASTROJEJUNOSTOMY     hx/notes 01/10/2015  . GASTROJEJUNOSTOMY N/A 09/23/2015   Procedure: OPEN GASTROJEJUNOSTOMY TUBE PLACEMENT;  Surgeon: Arta Bruce Kinsinger, MD;  Location: [facility];  Service: General;  Laterality: N/A;  . GLAUCOMA SURGERY Bilateral   . HERNIA REPAIR  2015  . IR CM INJ ANY COLONIC TUBE W/FLUORO  01/05/2017  . IR GENERIC HISTORICAL  01/07/2016   IR GJ TUBE CHANGE 01/07/2016 Jacqulynn Cadet, MD [facility]  . IR GENERIC HISTORICAL  01/27/2016   IR MECH REMOV OBSTRUC MAT ANY COLON TUBE W/FLUORO 01/27/2016 Markus Daft, MD [facility]  . IR GENERIC HISTORICAL  02/07/2016   IR PATIENT EVAL TECH 0-60 MINS  Darrell K Allred, PA-C [facility]  . IR GENERIC HISTORICAL  02/08/2016   IR GJ TUBE CHANGE 02/08/2016 Greggory Keen, MD [facility]  . IR GENERIC HISTORICAL  01/06/2016   IR GJ TUBE CHANGE 01/06/2016 [facility] OUT REF  . IR GENERIC HISTORICAL  05/02/2016   IR CM INJ ANY COLONIC TUBE W/FLUORO 05/02/2016 Arne Cleveland, MD [facility]  . IR GENERIC HISTORICAL  05/15/2016   IR GJ TUBE CHANGE 05/15/2016 Sandi Mariscal, MD [facility]  . IR GENERIC HISTORICAL  06/28/2016   IR GJ TUBE CHANGE 06/28/2016 [facility]  . IR GJ TUBE CHANGE  02/20/2017  . IR GJ TUBE CHANGE  05/10/2017  . IR PATIENT EVAL TECH 0-60 MINS  10/19/2016  . IR PATIENT EVAL TECH 0-60 MINS  12/25/2016  . IR PATIENT EVAL TECH 0-60 MINS  01/29/2017  . IR PATIENT EVAL TECH 0-60 MINS  04/04/2017  . IR PATIENT EVAL TECH 0-60 MINS  04/30/2017  . IR REPLC DUODEN/JEJUNO TUBE PERCUT W/FLUORO  11/14/2016  . LAPAROTOMY N/A 01/20/2015   Procedure: EXPLORATORY LAPAROTOMY;  Surgeon: Coralie Keens, MD;  Location: [facility];  Service: General;  Laterality: N/A;  . LOWER EXTREMITY ANGIOGRAPHY Left 09/21/2016   Procedure: Lower Extremity Angiography;  Surgeon: Waynetta Sandy, MD;  Location: [facility];  Service: Cardiovascular;  Laterality: Left;  . LYSIS OF ADHESION N/A 01/20/2015   Procedure: LYSIS OF ADHESIONS < 1 HOUR;  Surgeon: Coralie Keens, MD;  Location: [facility];  Service: General;  Laterality: N/A;  . PERIPHERAL VASCULAR BALLOON ANGIOPLASTY Left 09/21/2016   Procedure: Peripheral Vascular Balloon Angioplasty;  Surgeon: Waynetta Sandy, MD;  Location: [facility];  Service: Cardiovascular;  Laterality: Left;  drug coated balloon  . TONSILLECTOMY    . TUBAL LIGATION    . VENTRAL HERNIA REPAIR  2015   incarcerated ventral hernia ([facility] 09/2013)/notes 01/10/2015    OB History    No data available       Home Medications    Prior to Admission medications   Medication Sig Start Date End Date Taking? Authorizing  Provider  acetaminophen (TYLENOL) 500 MG tablet Take 1,000 mg by mouth every 6 (six) hours as needed (pain).   Yes [provider]  AMINO ACIDS-PROTEIN HYDROLYS PO Take 30 mLs by mouth 2 (two) times daily.   Yes [provider]  atorvastatin (LIPITOR) 10 MG tablet TAKE ONE TABLET BY MOUTH ONCE DAILY 12/22/16  Yes Gildardo Cranker, DO  b complex vitamins tablet Take 1 tablet by mouth daily.    Yes [provider]  calcium-vitamin D (OSCAL WITH D) 500-200 MG-UNIT per tablet Take 1 tablet by mouth daily with breakfast.    Yes [provider]  carvedilol (COREG) 6.25 MG tablet Take 2 tabs po in AM and 1  tab po in PM for blood pressure 11/03/16  Yes Eulas Post, [place], DO  cycloSPORINE (RESTASIS) 0.05 % ophthalmic emulsion USE 1 DROP INTO BOTH EYES TWICE DAILY 01/29/17  Yes Gildardo Cranker, DO  feeding supplement (OSMOLITE 1 CAL) LIQD Take 711 mLs by mouth at bedtime.    Yes [provider]  ferrous sulfate 325 (65 FE) MG tablet Take one tablet by mouth once daily with breakfast 04/30/17  Yes Eulas Post, Monica, DO  fluticasone (FLONASE) 50 MCG/ACT nasal spray Place 2 sprays into both nostrils daily. 04/19/17  Yes Eulas Post, Monica, DO  furosemide (LASIX) 40 MG tablet Take 1 tablet (40 mg total) by mouth daily. 11/13/16 11/11/17 Yes Dorothy Spark, MD  gabapentin (NEURONTIN) 300 MG capsule TAKE 2 CAPSULES (600 MG TOTAL) BY MOUTH 3 (THREE) TIMES DAILY. 05/28/17  Yes Gildardo Cranker, DO  hydrALAZINE (APRESOLINE) 50 MG tablet Take 1 tablet (50 mg total) by mouth 3 (three) times daily. 05/03/17 08/01/17 Yes Dorothy Spark, MD  insulin aspart (NOVOLOG FLEXPEN) 100 UNIT/ML FlexPen Take three times daily before each meal based on sliding scale 140-199( 2 units), 200-250 (4 units), 251-299 (6 units), 300-349 (8 units), > 350(10 units) 02/26/17  Yes Gildardo Cranker, DO  Insulin Glargine (LANTUS) 100 UNIT/ML Solostar Pen Injection 0.1 ml (10 units) once daily to control blood sugar. Dx  E11.9 11/01/16  Yes Eulas Post, Monica, DO  Insulin Pen Needle (B-D UF III MINI PEN NEEDLES) 31G X 5 MM MISC Use twice daily with giving insulin injections. Dx E11.9 04/25/17  Yes Eulas Post, Monica, DO  ipratropium-albuterol (DUONEB) 0.5-2.5 (3) MG/3ML SOLN Take 3 mLs by nebulization every 6 (six) hours as needed (sob). Use 3 times daily x 4 days then every 6 hours as needed. 03/21/16  Yes Eileen Stanford, PA-C  isosorbide mononitrate (IMDUR) 30 MG 24 hr tablet Take 2 tablets (60 mg total) by mouth daily. 04/13/17  Yes Dorothy Spark, MD  latanoprost (XALATAN) 0.005 % ophthalmic solution Place 1 drop into both eyes at bedtime. 06/02/14  Yes Blanchie Serve, MD  levalbuterol [facility] HFA) 45 MCG/ACT inhaler Inhale 2 puffs into the lungs every 6 (six) hours as needed for wheezing or shortness of breath. 02/03/16  Yes Eulas Post, Monica, DO  levothyroxine (SYNTHROID, LEVOTHROID) 137 MCG tablet TAKE 1 TABLET (137 MCG TOTAL) BY MOUTH DAILY BEFORE BREAKFAST. 05/21/17  Yes Eulas Post, Monica, DO  mometasone-formoterol (DULERA) 200-5 MCG/ACT AERO Inhale 2 puffs into the lungs 2 (two) times daily. 10/03/16  Yes Gildardo Cranker, DO  Multiple Vitamin (MULTIVITAMIN WITH MINERALS) TABS tablet Take 1 tablet by mouth daily.   Yes [provider]  nystatin (MYCOSTATIN/NYSTOP) powder Apply topically 3 (three) times daily. Patient taking differently: Apply 1 g topically 2 (two) times daily as needed (irritation).  09/26/16  Yes Nita Sells, MD  oxyCODONE (ROXICODONE) 5 MG immediate release tablet Take 1 tablet (5 mg total) by mouth every 4 (four) hours as needed for severe pain. May take 1/2 tab q4hrs as needed for mild pain. 01/30/17  Yes Gildardo Cranker, DO  OXYGEN Inhale 2 L into the lungs at bedtime.   Yes [provider]  pantoprazole (PROTONIX) 40 MG tablet Take 1 tablet (40 mg total) by mouth 2 (two) times daily. 05/29/17  Yes Eulas Post, Monica, DO  Polyethyl Glycol-Propyl Glycol 0.4-0.3 % SOLN Place 1 drop  into both eyes 4 (four) times daily.    Yes [provider]  sertraline (ZOLOFT) 100 MG tablet TAKE ONE TABLET BY MOUTH ONCE DAILY FOR  DEPRESSION 04/30/17  Yes Gildardo Cranker, DO  warfarin (COUMADIN) 2.5 MG tablet TAKE AS DIRECTED BY COUMADIN CLINIC. Patient taking differently: Take 5 mg every day except Friday take 2.5 mg by mouth. 05/22/17  Yes Dorothy Spark, MD  Water For Irrigation, Sterile (FREE WATER) SOLN Place 60 mLs into feeding tube 3 (three) times daily.    Yes [provider]  White Petrolatum-Mineral Oil (SYSTANE NIGHTTIME) OINT Apply 1 application to eye at bedtime. Both eyes   Yes [provider]  collagenase (SANTYL) ointment Apply 1 application topically daily. Patient not taking: Reported on 06/06/2017 05/21/17   Edrick Kins, DPM  gentamicin cream (GARAMYCIN) 0.1 % Apply 1 application topically 3 (three) times daily. Patient not taking: Reported on 06/06/2017 04/30/17   Edrick Kins, DPM  Maltodextrin-Xanthan Gum (RESOURCE THICKENUP CLEAR) POWD Take 120 g by mouth as needed (use to thicken liquids.). Patient not taking: Reported on 06/06/2017 11/11/15   Eugenie Filler, MD  nitrofurantoin (MACRODANTIN) 100 MG capsule Place 1 capsule (100 mg total) into feeding tube every 12 (twelve) hours. X 3 days Patient not taking: Reported on 06/06/2017 05/17/17   Gildardo Cranker, DO    Family History Family History  Problem Relation Age of Onset  . Stroke Mother   . Hypertension Mother   . Diabetes Brother   . Glaucoma Son   . Glaucoma Son   . Heart attack Neg Hx     Social History Social History   Tobacco Use  . Smoking status: Never Smoker  . Smokeless tobacco: Former Systems developer    Types: Snuff  . Tobacco comment: "used snuff in my younger days"  Substance Use Topics  . Alcohol use: No    Alcohol/week: 0.0 oz  . Drug use: No     Allergies   Penicillins and Other   Review of Systems Review of Systems  Constitutional: Positive for activity  change.  Respiratory: Negative for shortness of breath.   Cardiovascular: Positive for chest pain.  Gastrointestinal: Negative for nausea.  Allergic/Immunologic: Negative for immunocompromised state.  Hematological: Does not bruise/bleed easily.  All other systems reviewed and are negative.    Physical Exam Updated Vital Signs BP (!) 154/60   Pulse 72   Temp 98.8 F (37.1 C) (Oral)   Resp 15   SpO2 96%   Physical Exam  Constitutional: She is oriented to person, place, and time. She appears well-developed.  HENT:  Head: Normocephalic and atraumatic.  Eyes: EOM are normal.  Neck: Normal range of motion. Neck supple.  Cardiovascular: Normal rate, intact distal pulses and normal pulses.  Pulmonary/Chest: Effort normal. She has no decreased breath sounds. She has no wheezes. She has no rhonchi. She has no rales.  Abdominal: Bowel sounds are normal. There is tenderness.  Generalized abdominal tenderness  Neurological: She is alert and oriented to person, place, and time.  Skin: Skin is warm and dry.  Nursing note and vitals reviewed.    ED Treatments / Results  Labs (all labs ordered are listed, but only abnormal results are displayed) Labs Reviewed  BASIC METABOLIC PANEL - Abnormal; Notable for the following components:      Result Value   Glucose, Bld 103 (*)    BUN 73 (*)    Creatinine, Ser 1.67 (*)    GFR calc non Af Amer 26 (*)    GFR calc Af Amer 30 (*)    All other components within normal limits  CBC - Abnormal; Notable for  the following components:   RBC 3.19 (*)    Hemoglobin 9.5 (*)    HCT 29.0 (*)    RDW 16.3 (*)    All other components within normal limits  BRAIN NATRIURETIC PEPTIDE - Abnormal; Notable for the following components:   B Natriuretic Peptide 260.5 (*)    All other components within normal limits  I-STAT TROPONIN, ED  I-STAT TROPONIN, ED    EKG  EKG Interpretation  Date/Time:  Wednesday June 06 2017 16:24:15 EST Ventricular Rate:    79 PR Interval:    QRS Duration: 71 QT Interval:  390 QTC Calculation: 448 R Axis:   52 Text Interpretation:  Sinus rhythm Borderline low voltage, extremity leads No acute changes Nonspecific ST and T wave abnormality No old tracing to compare Confirmed by Varney Biles [phone]) on 06/06/2017 6:03:30 PM       Radiology Ct Abdomen Pelvis Wo Contrast  Result Date: 06/06/2017 CLINICAL DATA:  84 year old female with new onset chest pain EXAM: CT ABDOMEN AND PELVIS WITHOUT CONTRAST TECHNIQUE: Multidetector CT imaging of the abdomen and pelvis was performed following the standard protocol without IV contrast. COMPARISON:  Prior CT scan of the abdomen and pelvis 02/20/2017 FINDINGS: Lower chest: Stable peribronchovascular distribution of airspace opacification in the posterior inferior left lower lobe. Likely impaction of the supplying bronchials. There is no interval change compared to 02/20/2017. Otherwise, the visualized lower lungs are clear. Borderline cardiomegaly. Calcification of the inferior left ventricular papillary muscles consistent with prior myocardial infarction. Extensive atherosclerotic calcifications in the visualized thoracic aorta. Calcifications also noted along the coronary arteries. No pericardial effusion. Small hiatal hernia. Hepatobiliary: Stable appearance of a multifocal circumscribed low-attenuation hepatic lesions which are incompletely characterized in the absence of intravenous contrast but remain unchanged compared to prior imaging and are therefore favored to represent benign cysts. Scattered punctate calcifications consistent with old granulomatous disease. The gallbladder is surgically absent. No intra or extrahepatic biliary ductal dilatation. Pancreas: Unremarkable. No pancreatic ductal dilatation or surrounding inflammatory changes. Spleen: Normal in size without focal abnormality. Adrenals/Urinary Tract: Unremarkable adrenal glands. No hydronephrosis or nephrolithiasis.  Ureters and bladder are unremarkable. Stomach/Bowel: Percutaneous gastrojejunostomy tube. The J arm terminates in the proximal small bowel (third portion of the duodenum). No evidence of bowel obstruction. Colonic diverticular disease without CT evidence of active inflammation. Stable appearance of midline ventral abdominal hernias containing colon to the right of the umbilicus and containing herniated omental fat to the left of the umbilicus. Vascular/Lymphatic: Limited evaluation in the absence of intravenous contrast. No aneurysm. Extensive atherosclerotic vascular calcifications. Reproductive: Uterus and bilateral adnexa are unremarkable. Other: Abdominal wall hernias as described above. No interval change compared to 02/20/2017. No ascites. Musculoskeletal: No acute fracture or aggressive appearing lytic or blastic osseous lesion. Stable T11 compression fracture with approximately 40% height loss. IMPRESSION: 1. No acute abnormality within the abdomen or pelvis. 2. Percutaneous gastrojejunostomy tube. The J arm extension tip is within the proximal small bowel (third portion of the duodenum). 3. Stable right paramedian ventral abdominal hernia containing a segment of colon without evidence of obstruction. 4. Stable left paramedian ventral abdominal hernia containing omental fat. 5. Colonic diverticular disease without CT evidence of active inflammation. 6. Stable chronic left lower lobe atelectasis/pleuroparenchymal scarring without interval change compared to 02/20/2017. 7. Coronary artery calcifications. 8.  Aortic Atherosclerosis (ICD10-170.0) 9. Additional ancillary findings as above without significant interval change. Electronically Signed   By: Jacqulynn Cadet M.D.   On: 06/06/2017 21:19   Dg Chest  2 View  Result Date: 06/06/2017 CLINICAL DATA:  Chest tightness upon standing. EXAM: CHEST - 2 VIEW COMPARISON:  09/26/2016 FINDINGS: Heart and mediastinal contours within normal limits for size. Moderate  aortic atherosclerosis along the transverse portion is noted. There is atelectasis at each lung base. Mild vascular congestion is noted without pneumonic consolidation or pneumothorax. No acute nor suspicious osseous abnormalities. Osteoarthritis of both glenohumeral joints with spurring off the inferomedial aspect of both humeral heads. IMPRESSION: 1. Bibasilar atelectasis with mild pulmonary vascular congestion. 2. Moderate aortic atherosclerosis. Electronically Signed   By: Ashley Royalty M.D.   On: 06/06/2017 17:50   Dg Abd 2 Views  Result Date: 06/06/2017 CLINICAL DATA:  84 year old female with abdominal distension EXAM: ABDOMEN - 2 VIEW COMPARISON:  Prior CT scan of the abdomen and pelvis 02/20/2017 FINDINGS: Nonobstructed bowel gas pattern. No evidence of free air on the lateral decubitus radiograph. Evidence of prior extensive bowel surgery in the left upper quadrant. An enteric feeding tube is present. The tip of the tube overlies the descending duodenum. Atherosclerotic calcifications present in the aorta and iliac arteries. The bones appear osteopenic. No acute fracture or malalignment. IMPRESSION: 1. No evidence of bowel obstruction or free air. 2. The tip of a enteric feeding tube projects over the descending duodenum. Electronically Signed   By: Jacqulynn Cadet M.D.   On: 06/06/2017 18:45    Procedures Procedures (including critical care time)  Medications Ordered in ED Medications  sodium chloride 0.9 % bolus 500 mL (500 mLs Intravenous New Bag/Given 06/06/17 2120)  gi cocktail (Maalox,Lidocaine,Donnatal) (not administered)     Initial Impression / Assessment and Plan / ED Course  I have reviewed the triage vital signs and the nursing notes.  Pertinent labs & imaging results that were available during my care of the patient were reviewed by me and considered in my medical decision making (see chart for details).  Clinical Course as of Jun 06 2213  Wed Jun 06, 2017  2214 Creatinine  slightly elevated. Patient is able to drink.  She has no emesis.  Advised follow-up with PCP in 1 week for repeat creatinine check. Creatinine: (!) 1.67 [AN]  2214 CT scan does not show any acute findings.  Suspicion is high for esophageal spasms or esophagitis.  Troponins x2 are negative.  EKG had no concerning findings.  I feel comfortable discharging patient from cardiac perspective.  We have advised that patient follow-up with GI.  Strict ER return precautions have been discussed, and patient is agreeing with the plan and is comfortable with the workup done and the recommendations from the ER.  CT ABDOMEN PELVIS WO CONTRAST [AN]    Clinical Course User Index [AN] Varney Biles, MD    84 year old female comes in with chief complaint of chest discomfort. Differential diagnosis includes: ACS syndrome Valvular disorder Myocarditis Pericarditis Pneumonia Pleural effusion / Pulmonary edema PE Pneumothorax Musculoskeletal pain PUD / Gastritis / Esophagitis Esophageal spasm Ventral hernia Thoracic artery aneurysm  Patient's pain is midsternal, and it appears to be exaggerated by p.o. intake and with belching.  Patient has known history of pyloric stenosis, and hernia.  Clinical concerns are high for GI etiology for the symptoms.  We will still get delta troponin and EKG. chest x-ray ordered.  In addition to the etiologies above-mentioned, we also considered small bowel obstruction in the differential diagnosis.  Patient is having generalized abdominal tenderness, and we might have to get a CT scan to ensure there is no perforation /  SBO.  Final Clinical Impressions(s) / ED Diagnoses   Final diagnoses:  Chest pain, unspecified type  Esophageal spasm  AKI (acute kidney injury) [facility])    ED Discharge Orders    None       Varney Biles, MD 06/06/17 2215

## 2017-06-06 NOTE — ED Notes (Signed)
Bed: FK81 Expected date:  Expected time:  Means of arrival:  Comments: Tri 1

## 2017-06-07 ENCOUNTER — Ambulatory Visit (HOSPITAL_COMMUNITY)
Admission: RE | Admit: 2017-06-07 | Discharge: 2017-06-07 | Disposition: A | Discharge status: Home / Self Care | Payer: Medicare Other | Source: Ambulatory Visit | Attending: Radiology | Admitting: Radiology

## 2017-06-07 ENCOUNTER — Other Ambulatory Visit (HOSPITAL_COMMUNITY): Payer: Self-pay | Admitting: Radiology

## 2017-06-07 DIAGNOSIS — R633 Feeding difficulties, unspecified: Secondary | ICD-10-CM

## 2017-06-07 DIAGNOSIS — K9423 Gastrostomy malfunction: Secondary | ICD-10-CM | POA: Diagnosis not present

## 2017-06-07 DIAGNOSIS — Z434 Encounter for attention to other artificial openings of digestive tract: Secondary | ICD-10-CM | POA: Insufficient documentation

## 2017-06-07 DIAGNOSIS — K9413 Enterostomy malfunction: Secondary | ICD-10-CM | POA: Diagnosis not present

## 2017-06-07 HISTORY — PX: IR CM INJ ANY COLONIC TUBE W/FLUORO: IMG2336

## 2017-06-07 MED ORDER — IOPAMIDOL (ISOVUE-300) INJECTION 61%
INTRAVENOUS | Status: AC
  Administered 2017-06-07: 10 mL
  Filled 2017-06-07: qty 50

## 2017-06-07 MED ORDER — LIDOCAINE VISCOUS 2 % MT SOLN
OROMUCOSAL | Status: AC
  Filled 2017-06-07: qty 15

## 2017-06-07 MED ORDER — SILVER NITRATE-POT NITRATE 75-25 % EX MISC
CUTANEOUS | Status: AC | PRN
  Administered 2017-06-07: 2 via TOPICAL

## 2017-06-07 MED ORDER — SILVER NITRATE-POT NITRATE 75-25 % EX MISC
CUTANEOUS | Status: AC
  Filled 2017-06-07: qty 1

## 2017-06-07 MED ORDER — IOPAMIDOL (ISOVUE-300) INJECTION 61%
10.0000 mL | Freq: Once | INTRAVENOUS | Status: AC | PRN
  Administered 2017-06-07: 10 mL

## 2017-06-08 ENCOUNTER — Encounter (HOSPITAL_COMMUNITY): Payer: Self-pay | Admitting: Interventional Radiology

## 2017-06-11 ENCOUNTER — Ambulatory Visit (HOSPITAL_COMMUNITY)
Admission: RE | Admit: 2017-06-11 | Discharge: 2017-06-11 | Disposition: A | Discharge status: Home / Self Care | Payer: Medicare Other | Source: Ambulatory Visit | Attending: Cardiology | Admitting: Cardiology

## 2017-06-11 ENCOUNTER — Ambulatory Visit (INDEPENDENT_AMBULATORY_CARE_PROVIDER_SITE_OTHER): Payer: Medicare Other | Admitting: *Deleted

## 2017-06-11 DIAGNOSIS — L97512 Non-pressure chronic ulcer of other part of right foot with fat layer exposed: Secondary | ICD-10-CM | POA: Insufficient documentation

## 2017-06-11 DIAGNOSIS — I4892 Unspecified atrial flutter: Secondary | ICD-10-CM

## 2017-06-11 DIAGNOSIS — E0859 Diabetes mellitus due to underlying condition with other circulatory complications: Secondary | ICD-10-CM | POA: Insufficient documentation

## 2017-06-11 DIAGNOSIS — Z5181 Encounter for therapeutic drug level monitoring: Secondary | ICD-10-CM

## 2017-06-11 LAB — POCT INR: INR: 2.5

## 2017-06-12 IMAGING — CT CT ABD-PELV W/ CM
2 of 5 series · 15 of 46 positions shown, 17 images · IV contrast (Omni 300)
Comparison: CT of the abdomen and pelvis performed 05/21/2014

CLINICAL DATA: Acute onset of nausea, vomiting and generalized
abdominal pain. Initial encounter.

EXAM:
CT ABDOMEN AND PELVIS WITH CONTRAST
TECHNIQUE: Multidetector CT imaging of the abdomen and pelvis was performed
using the standard protocol following bolus administration of
intravenous contrast.
CONTRAST:  75mL OMNIPAQUE IOHEXOL 300 MG/ML  SOLN

[Series 2: a/p w/ 5mm · axial · 0.73mm/px · z∈[+795,+1200]mm · 12 of 93 slices shown, 14 images]
[im 6/93  soft-tissue]
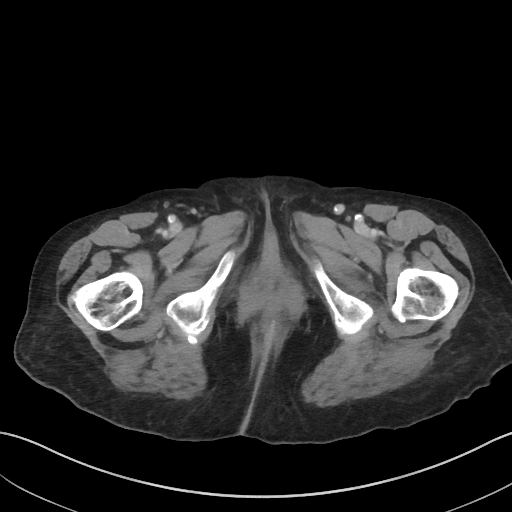
[im 6/93  bone]
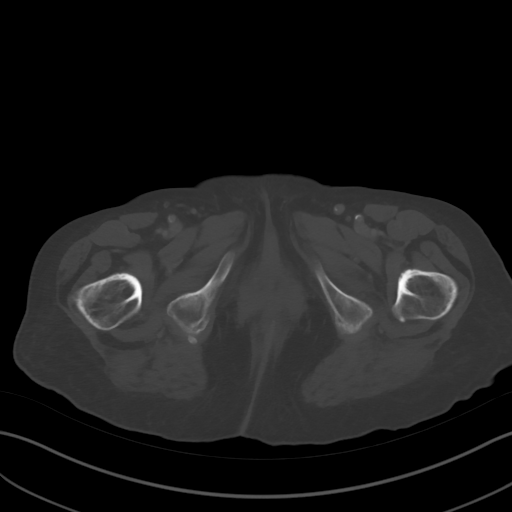
[im 17/93  soft-tissue]
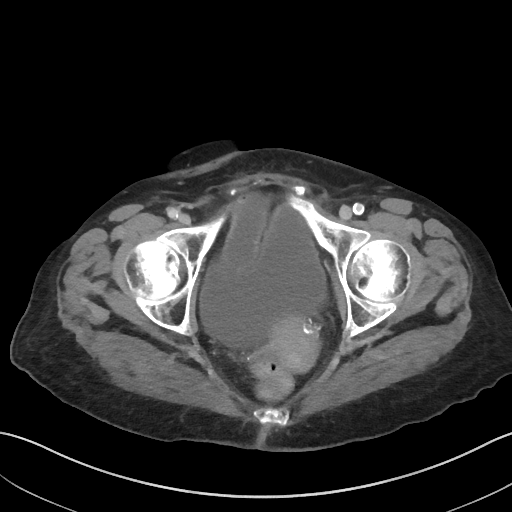
[im 22/93  soft-tissue]
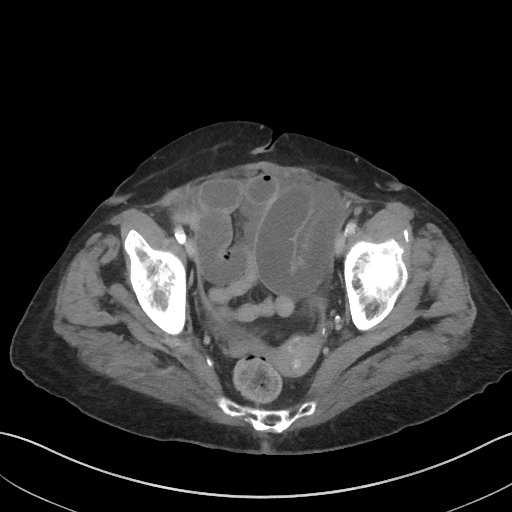
[im 28/93  soft-tissue]
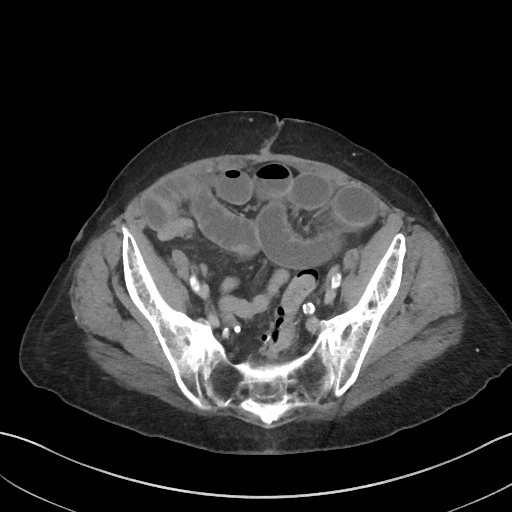
[im 38/93  soft-tissue]
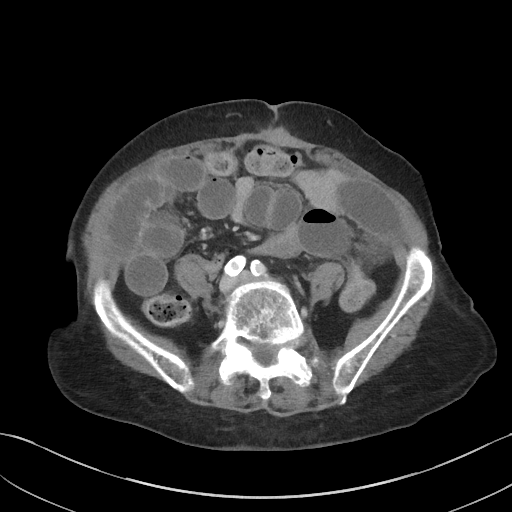
[im 44/93  soft-tissue]
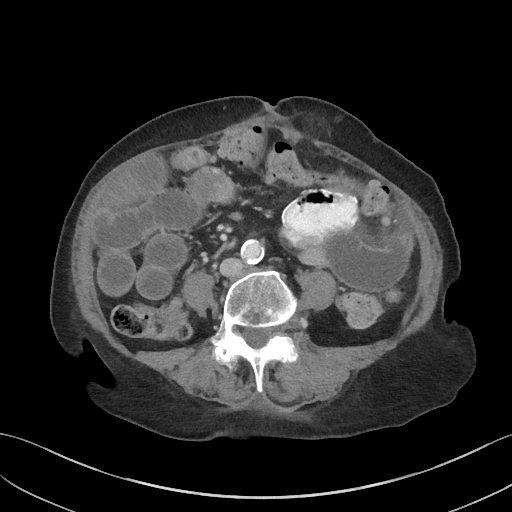
[im 49/93  soft-tissue]
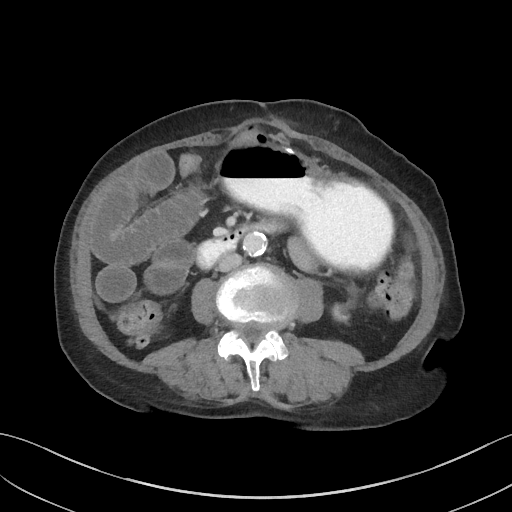
[im 60/93  soft-tissue]
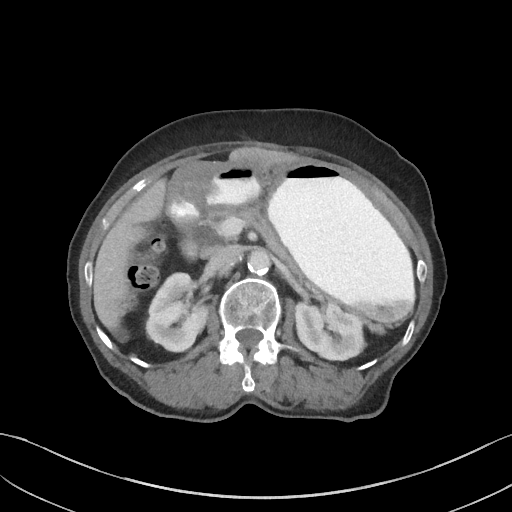
[im 65/93  soft-tissue]
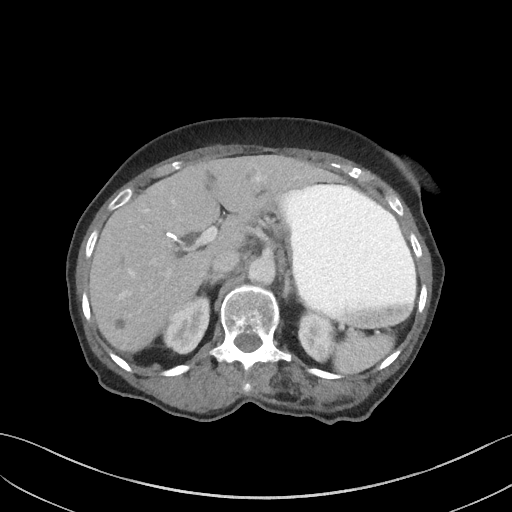
[im 65/93  bone]
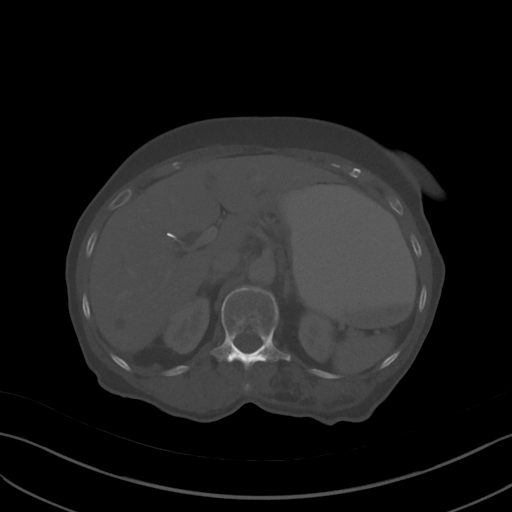
[im 71/93  soft-tissue]
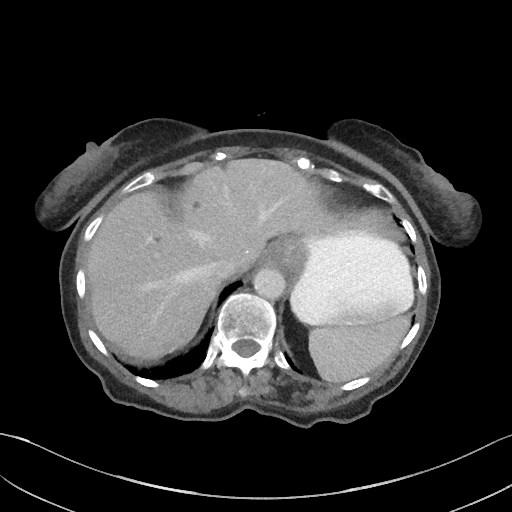
[im 82/93  soft-tissue]
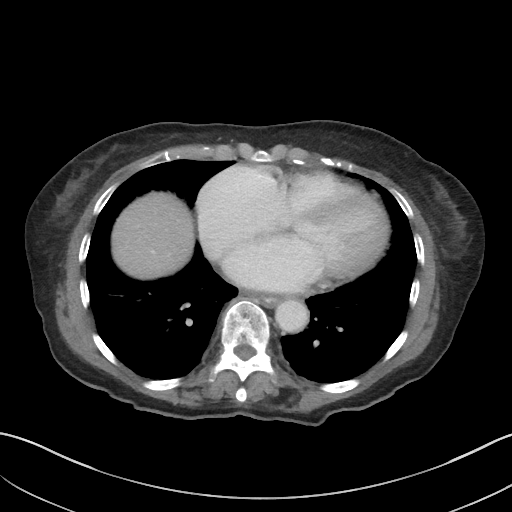
[im 87/93  soft-tissue]
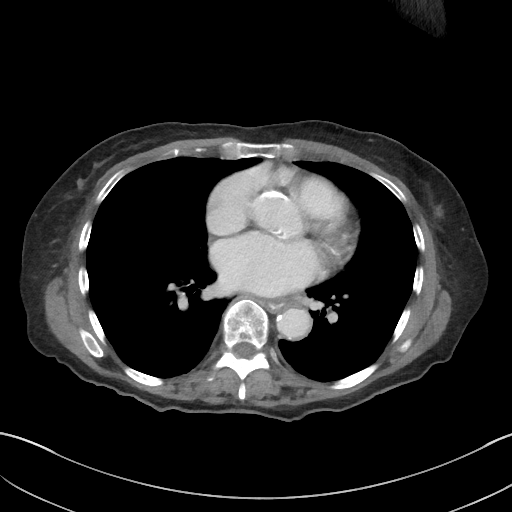

[Series 5: a/p w/ cor · coronal · 0.73mm/px · 3 of 140 slices shown]
[im 47/140  soft-tissue]
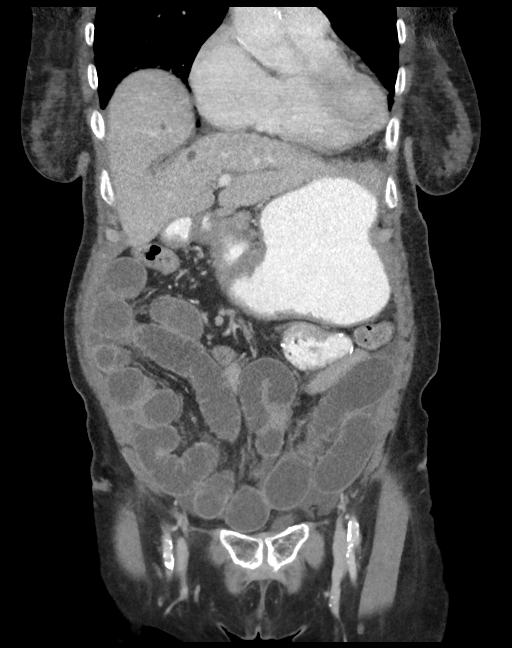
[im 62/140  soft-tissue]
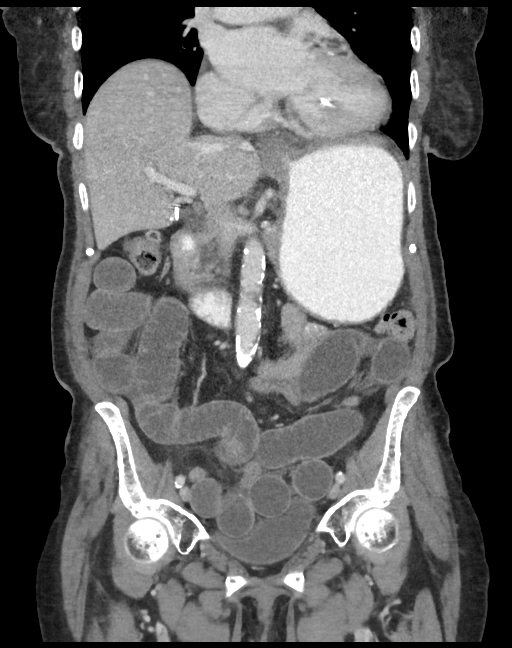
[im 78/140  soft-tissue]
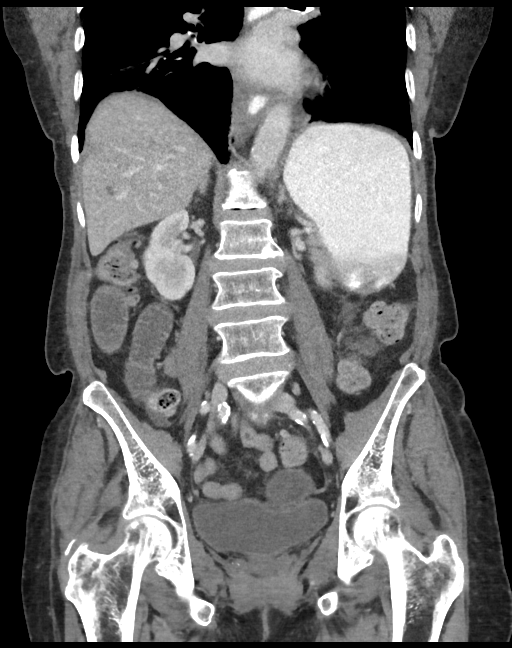

[15 of 46 positions shown; findings below may reference images not displayed]

FINDINGS: Minimal right basilar atelectasis is noted. Calcification is noted
at the aortic valve.

There is distention of multiple small bowel loops to 3.4 cm in
maximal diameter. Trace associated free fluid is seen. Multiple foci
of decompression are seen, with decompression of the distal ileum.
This likely reflects small bowel dysmotility, though partial
small-bowel obstruction cannot be excluded. A small bowel
anastomosis at the left mid abdomen is grossly unremarkable.

There is slight extension of a wall of the transverse colon into a
tiny right periumbilical hernia, without evidence for incarceration.
A tiny left periumbilical hernia is also seen, containing only fat.

Scattered small hypodensities within the liver are nonspecific but
may reflect small cysts. The spleen is unremarkable in appearance.
The patient is status post cholecystectomy, with clips noted at the
gallbladder fossa. The pancreas and adrenal glands are unremarkable.

Tiny bilateral renal cysts are seen. The kidneys are otherwise
unremarkable. There is no evidence of hydronephrosis. No renal or
ureteral stones are seen. No perinephric stranding is appreciated.

The stomach is within normal limits. No acute vascular abnormalities
are seen. Diffuse calcification is noted along the abdominal aorta
and its branches.

The appendix is not definitely seen; there is no evidence for
appendicitis. Scattered diverticulosis is noted along the entirety
of the colon, without evidence of diverticulitis.

The bladder is mildly distended and grossly unremarkable. The uterus
is unremarkable in appearance. The right ovary is unremarkable in
appearance. No suspicious adnexal masses are seen. No inguinal
lymphadenopathy is seen.

No acute osseous abnormalities are identified. There is mild chronic
compression deformity of vertebral body T11.
IMPRESSION: 1. Distention of multiple small bowel loops to 3.4 cm in maximal
diameter, with trace associated free fluid. Multiple foci of
decompression noted along the small bowel, with decompression of the
distal ileum. This likely reflects some degree of small bowel
dysmotility, though partial small bowel obstruction cannot be
excluded.
2. Slight extension of the wall of the transverse colon into a tiny
right periumbilical hernia, without evidence for incarceration. Tiny
left periumbilical hernia also seen, containing only fat.
3. Diffuse calcification along the abdominal aorta and its branches.
4. Scattered diverticulosis along the entirety of the colon, without
evidence of diverticulitis.
5. Calcification at the aortic valve.

## 2017-06-12 IMAGING — RF DG ABDOMEN 1V
3 series · 3 of 3 positions shown · non-contrast
Comparison: None.

CLINICAL DATA: Assess nasogastric placement.

EXAM:
ABDOMEN - 1 VIEW

[Series 1: cp_standard · 0.30mm/px · 1 of 1 slices shown (1 of 3)]
[im 1/1]
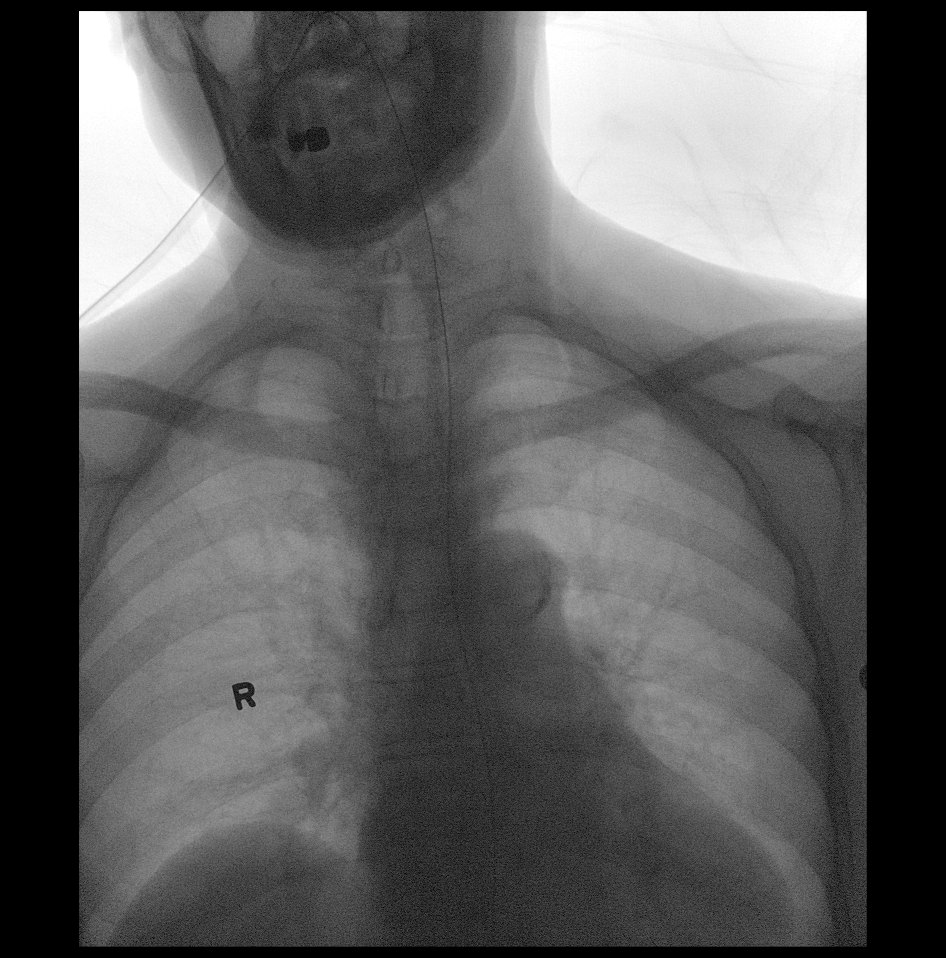

[Series 2: cp_standard · 0.30mm/px · 1 of 1 slices shown (2 of 3)]
[im 1/1]
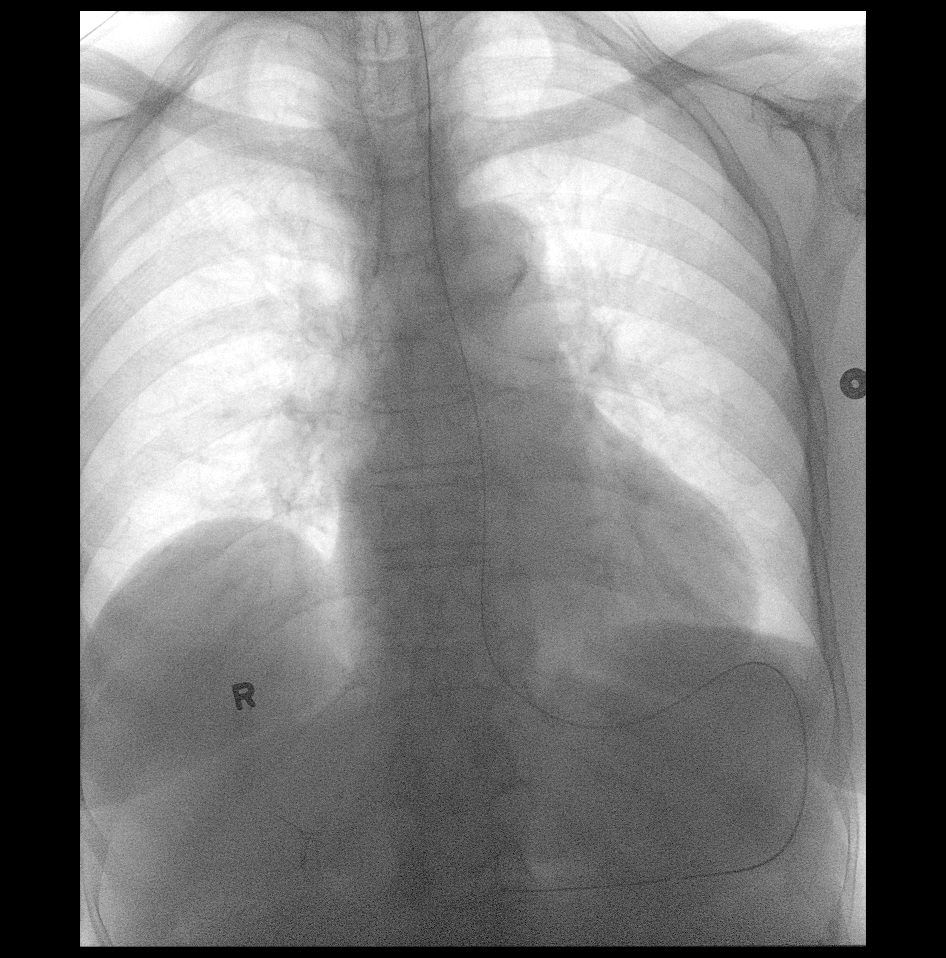

[Series 3: cp_standard · 0.31mm/px · 1 of 1 slices shown (3 of 3)]
[im 1/1]
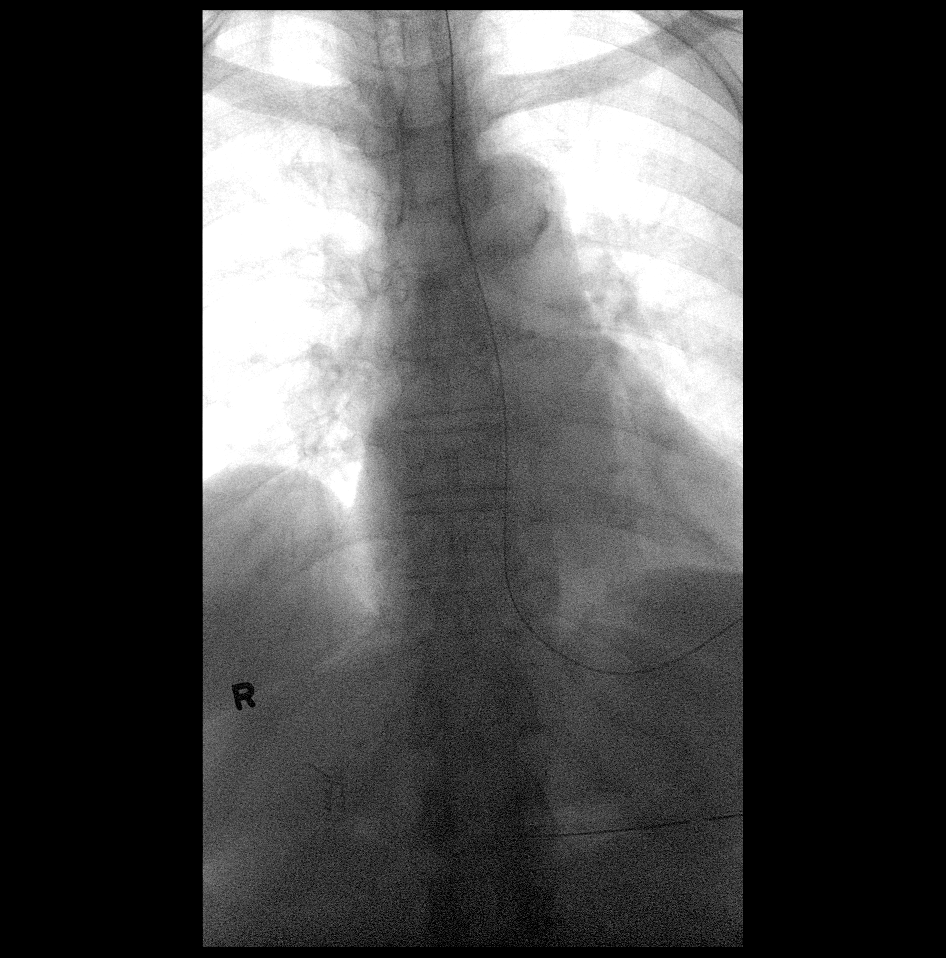

[3 of 3 positions shown; findings below may reference images not displayed]

FINDINGS: Nasogastric tube enters the stomach, passes to the fundus and along
the greater curvature with its tip in the antrum.
IMPRESSION: Nasogastric tube tip at the gastric antrum.

## 2017-06-13 IMAGING — DX DG ABDOMEN 1V
1 series · 1 of 1 positions shown · non-contrast
Comparison: CT 01/10/2015

CLINICAL DATA: Small bowel obstruction

EXAM:
ABDOMEN - 1 VIEW

[abdomen kub]
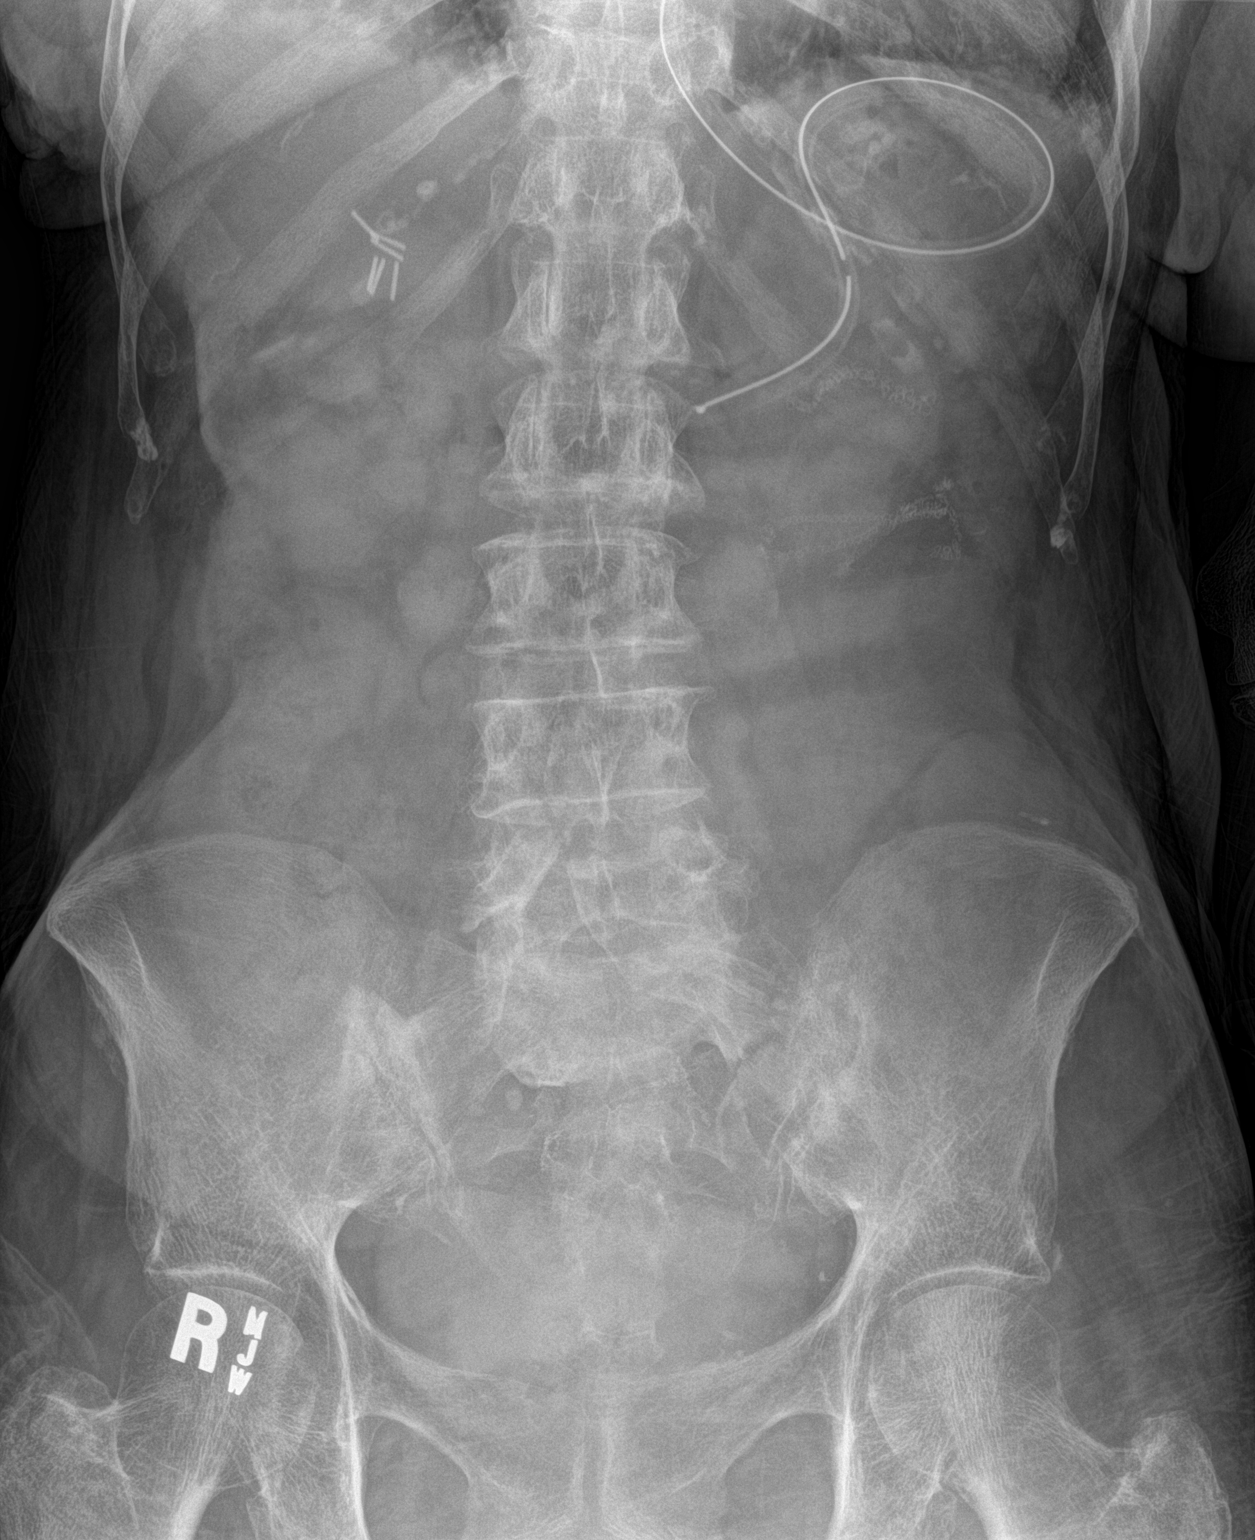

[1 of 1 positions shown; findings below may reference images not displayed]

FINDINGS: Prior cholecystectomy. NG tube is in the stomach. No gas-filled
dilated small bowel loops noted. No free air organomegaly. No acute
bony abnormality.
IMPRESSION: No gas-filled dilated small bowel loops noted. NG tube remains
present in the stomach.

## 2017-06-15 IMAGING — DX DG ABDOMEN 2V
2 series · 2 of 2 positions shown · non-contrast
Comparison: 01/11/2015

CLINICAL DATA: Follow-up small bowel obstruction

EXAM:
ABDOMEN - 2 VIEW

[abdomen supine]
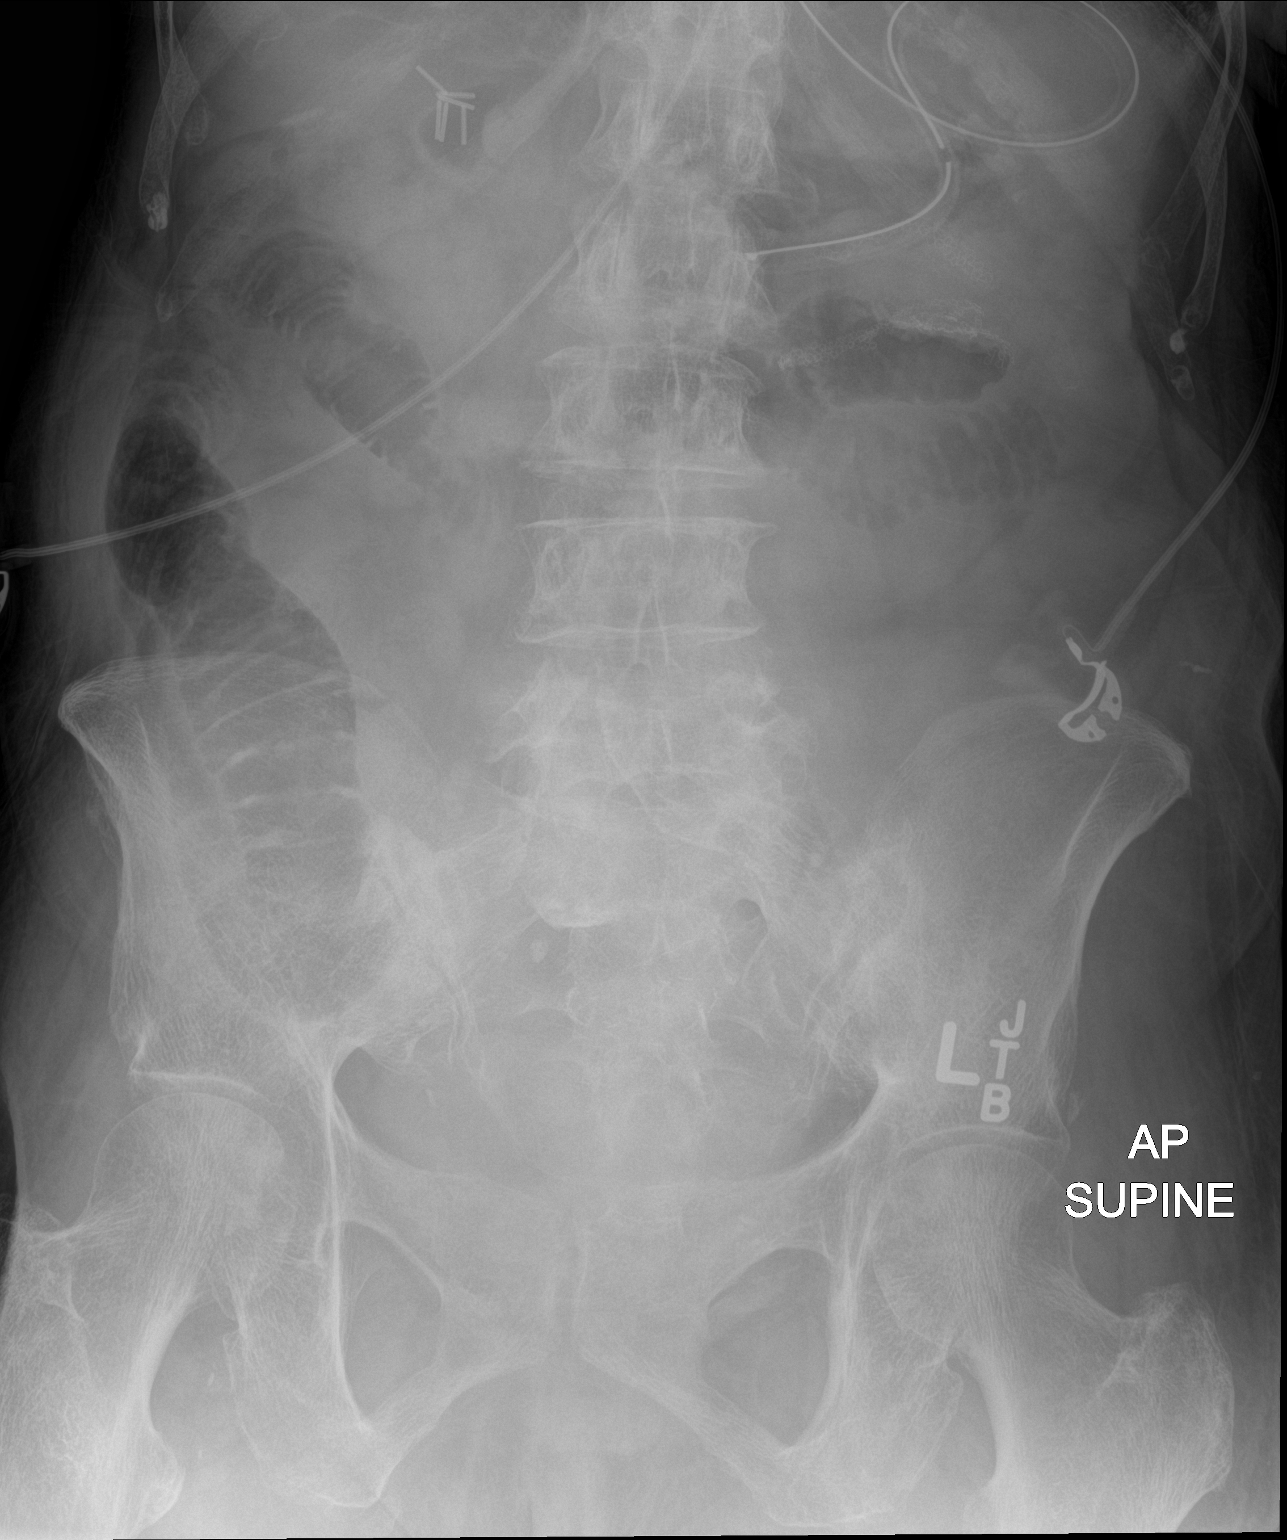

[abdomen decu]
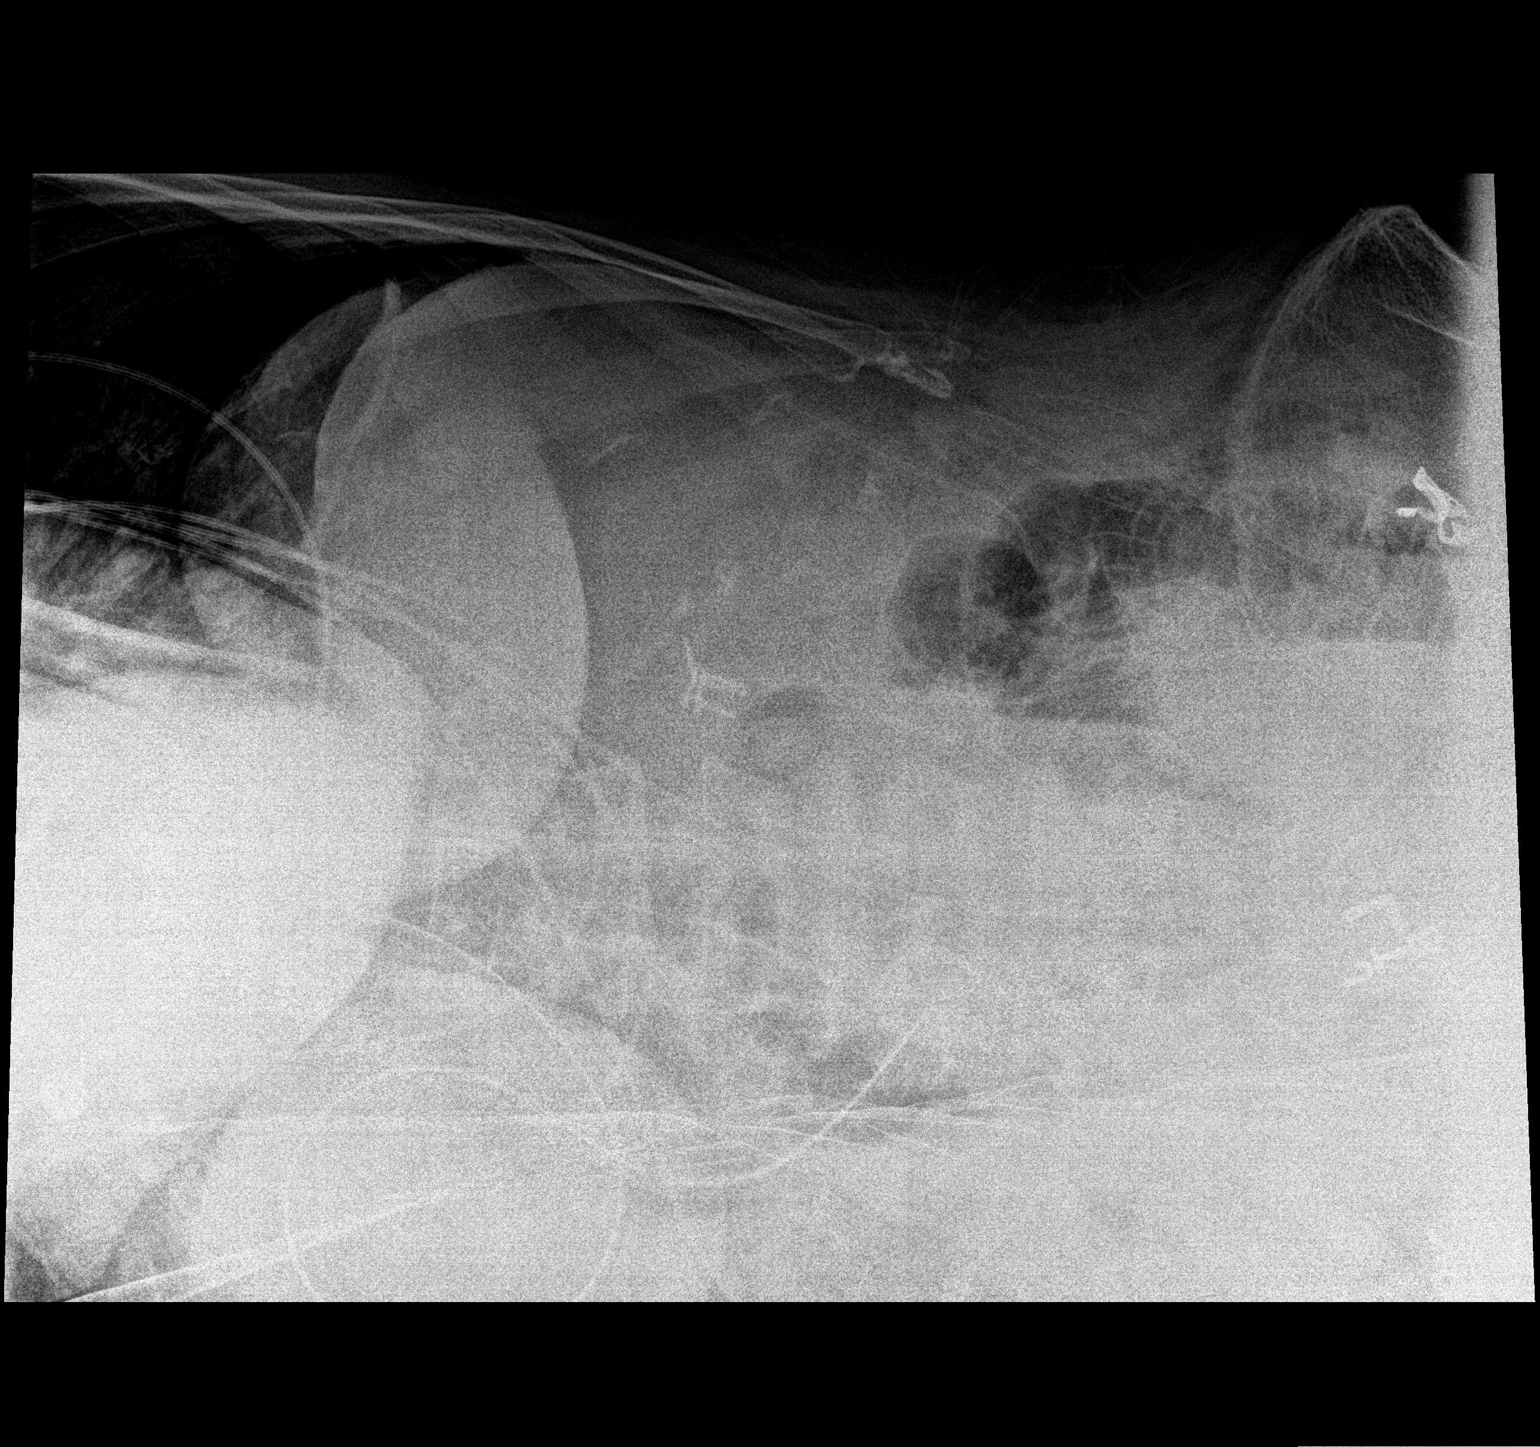

[2 of 2 positions shown; findings below may reference images not displayed]

FINDINGS: There are distended small bowel loops in right abdomen and left
upper abdomen highly suspicious for bowel obstruction. Paucity of
bowel gas in left lower abdomen and pelvis.
IMPRESSION: Gaseous distended small bowel loops in right abdomen and upper
abdomen highly suspicious for bowel obstruction. Paucity of bowel
gas in left abdomen and pelvis

## 2017-06-17 IMAGING — DX DG ABDOMEN 2V
3 series · 3 of 3 positions shown · non-contrast
Comparison: 01/13/2015

CLINICAL DATA: 89-year-old female with abdominal pain and
distention for 6 days.

EXAM:
ABDOMEN - 2 VIEW

[abdomen supine (1 of 2)]
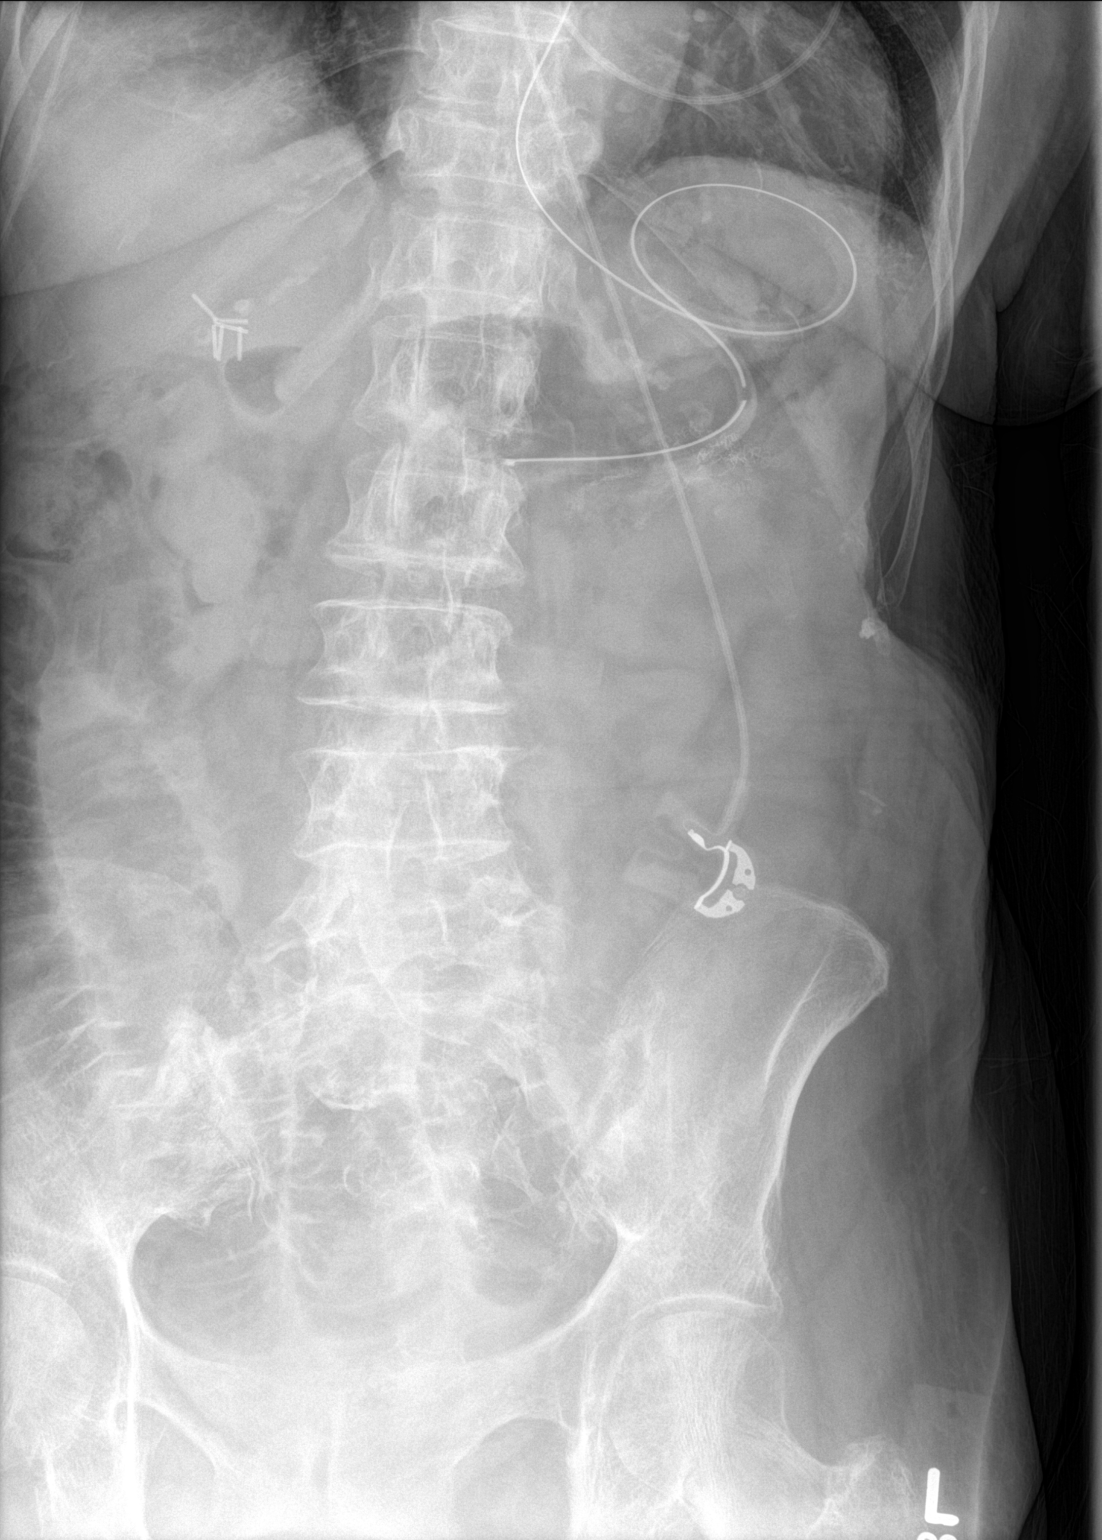

[abdomen supine (2 of 2)]
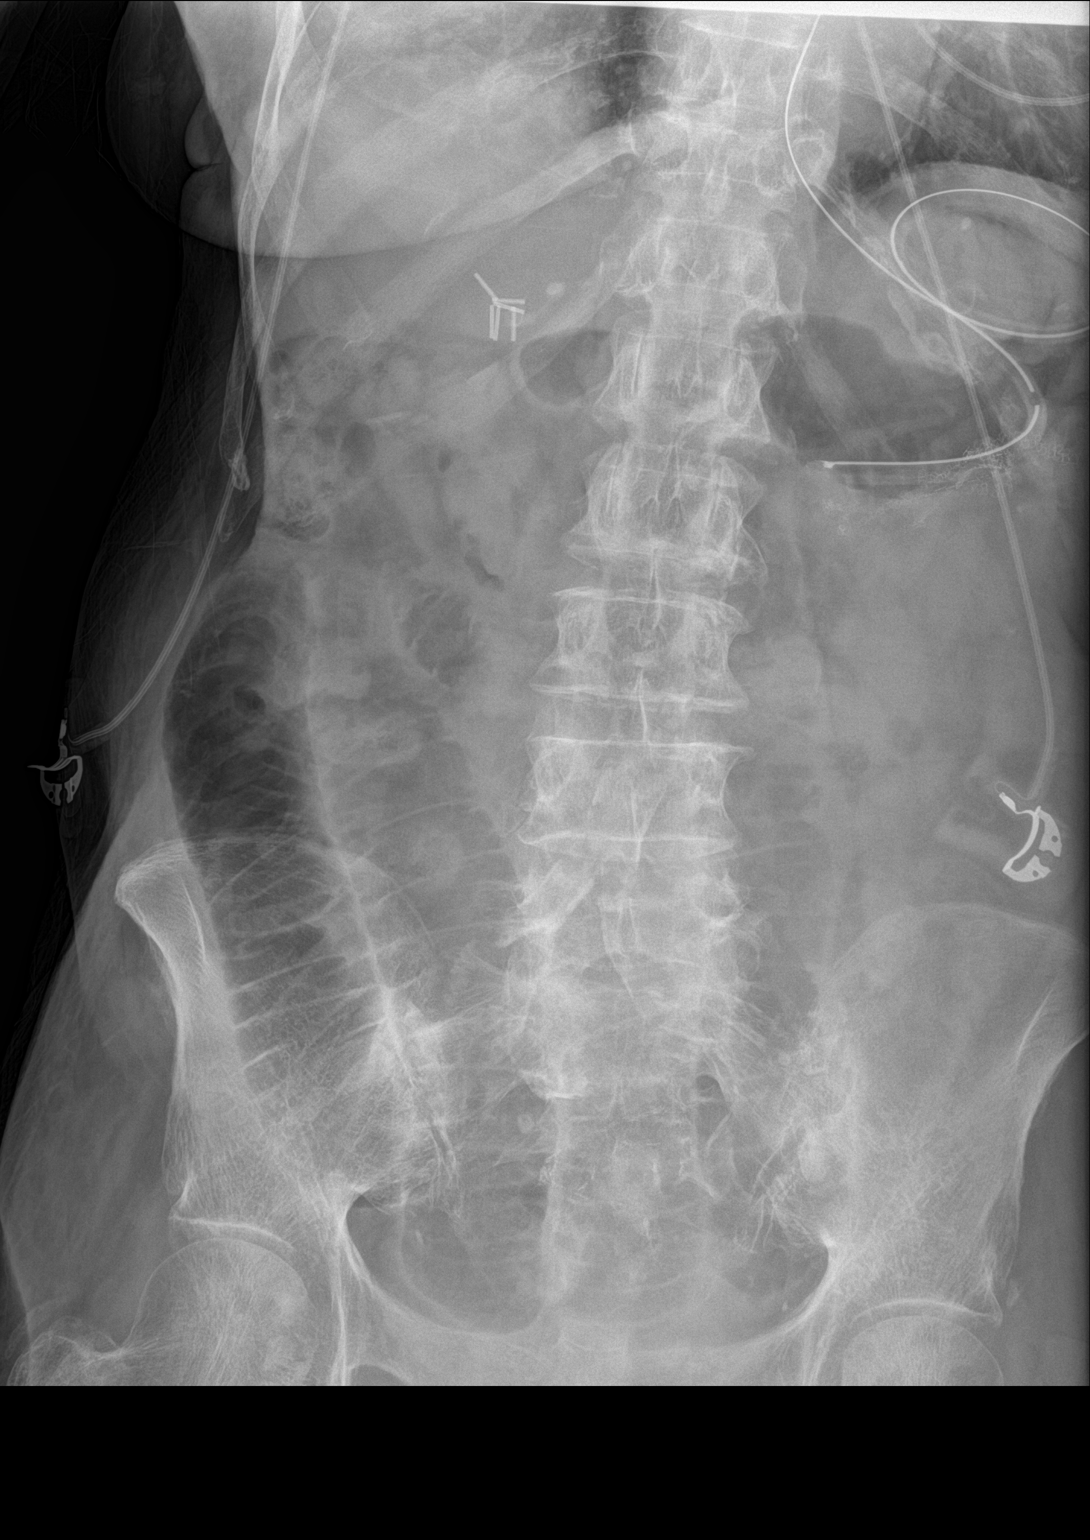

[abdomen decu]
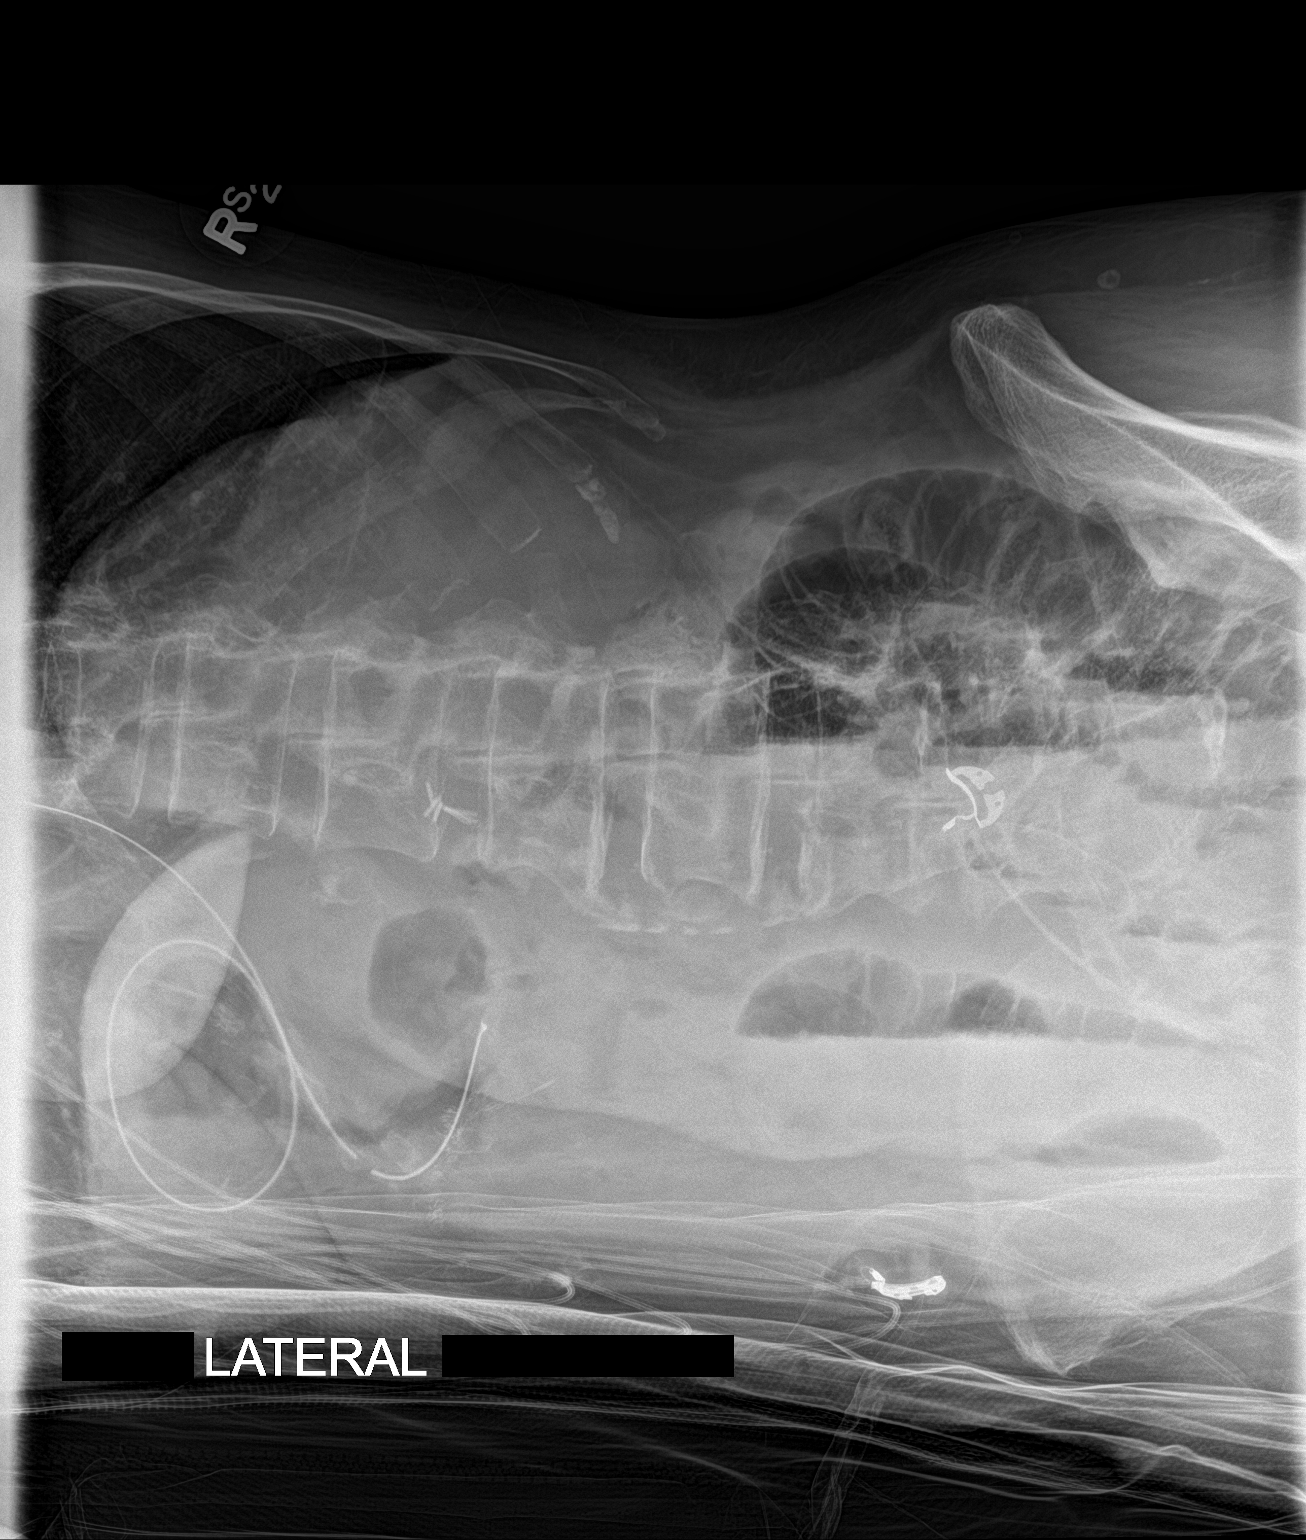

[3 of 3 positions shown; findings below may reference images not displayed]

FINDINGS: Increasing small bowel distention noted compatible with small bowel
obstruction. There is no evidence of pneumoperitoneum.

An NG tube with tip overlying the mid stomach is noted.

Cholecystectomy clips are present.

No acute bony abnormalities are identified.
IMPRESSION: Increasing small bowel distention compatible small bowel
obstruction. No evidence of pneumoperitoneum.

NG tube with tip overlying the mid stomach.

## 2017-06-18 ENCOUNTER — Other Ambulatory Visit: Payer: Self-pay | Admitting: Internal Medicine

## 2017-06-18 DIAGNOSIS — I5032 Chronic diastolic (congestive) heart failure: Secondary | ICD-10-CM

## 2017-06-19 ENCOUNTER — Other Ambulatory Visit: Payer: Self-pay | Admitting: Internal Medicine

## 2017-06-19 IMAGING — DX DG ABDOMEN 2V
2 series · 2 of 2 positions shown · non-contrast
Comparison: 01/15/2015

CLINICAL DATA: SBO; pt states she is feeling better today due to
flatulence this morning

EXAM:
ABDOMEN - 2 VIEW

[abdomen erect]
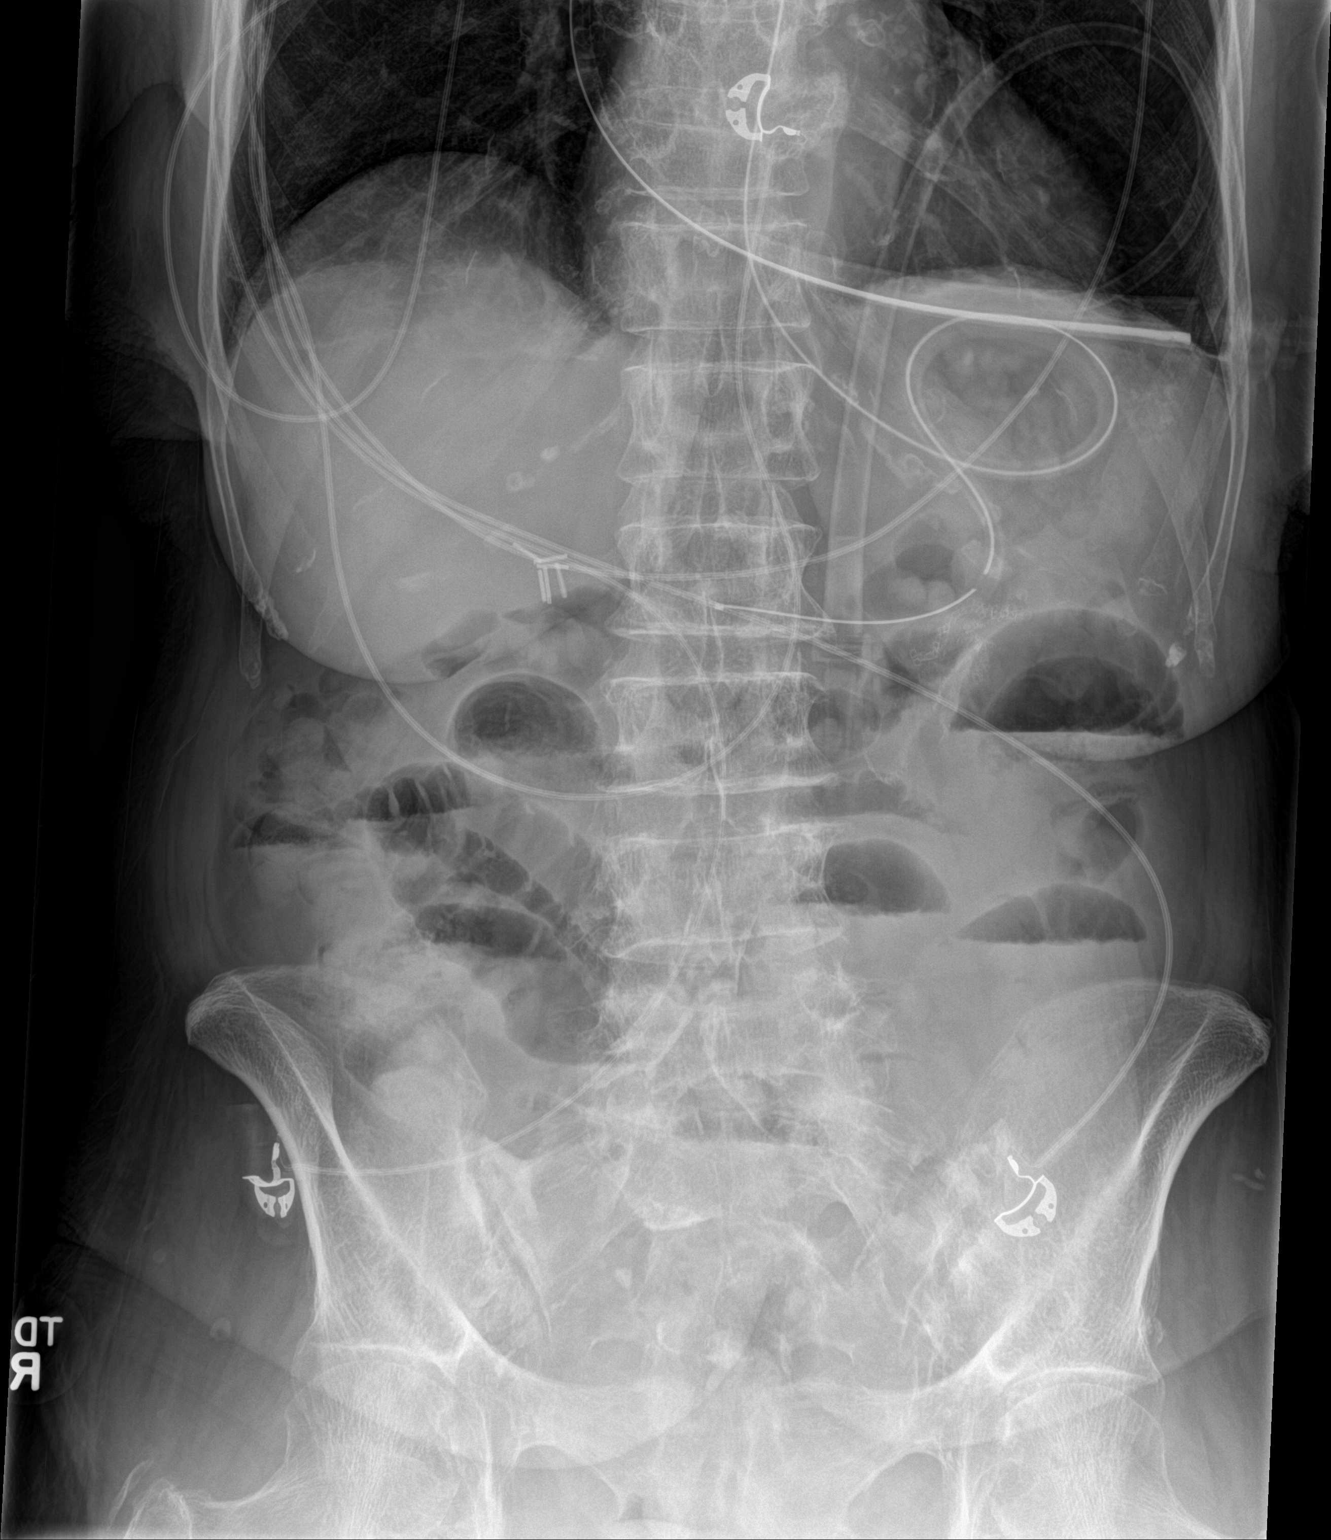

[abdomen supine]
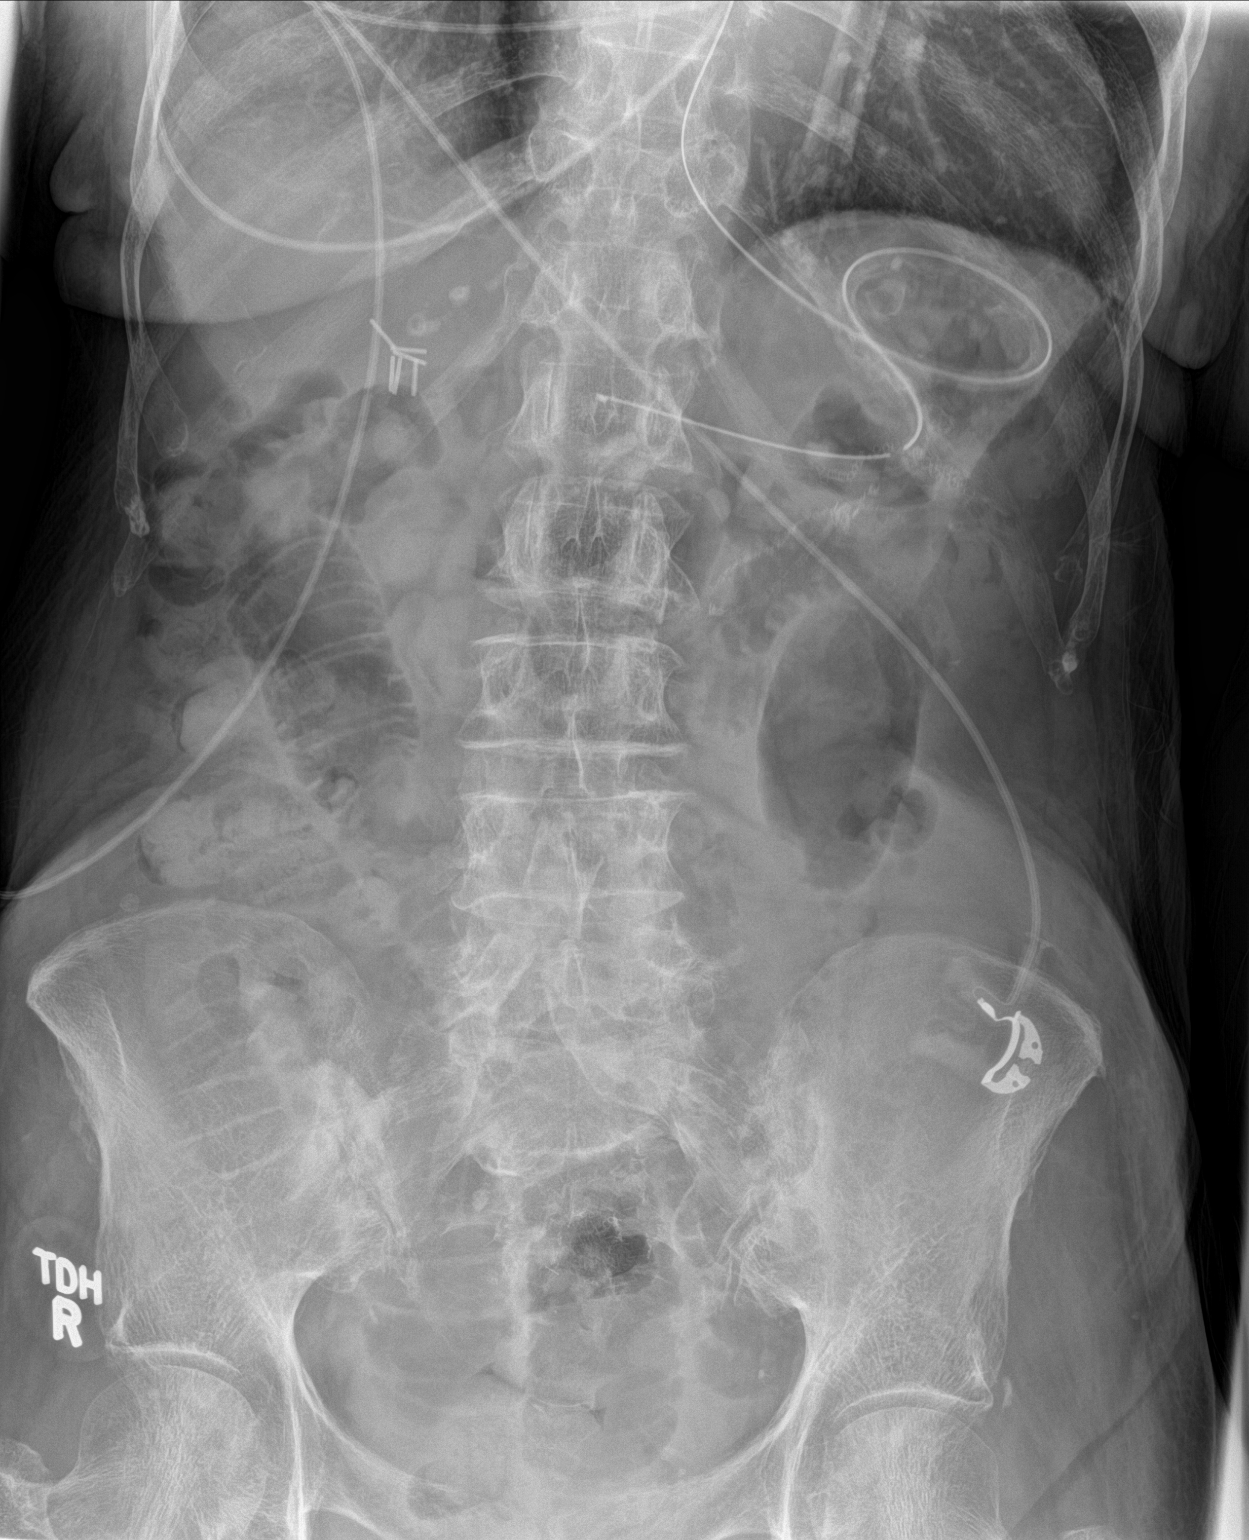

[2 of 2 positions shown; findings below may reference images not displayed]

FINDINGS: Upright and supine views. The upright view demonstrates a
nasogastric tube looped within the body of the stomach. Multiple
leads and wires projecting over the abdomen. Multiple small bowel
air-fluid levels. No free intraperitoneal air.

The supine image demonstrates small bowel distension, example loop
3.8 cm. Compare 4.0 cm at the same level on the prior. small volume
colonic gas. No pneumatosis.
IMPRESSION: Similar partial small bowel obstruction. No free intraperitoneal air
or other acute complication.

## 2017-06-20 IMAGING — CR DG ABDOMEN 2V
2 series · 2 of 2 positions shown · non-contrast
Comparison: Abdominal radiograph from 1 day prior.

CLINICAL DATA: Small bowel obstruction.  Enteric tube.

EXAM:
ABDOMEN - 2 VIEW

[abdomen supine]
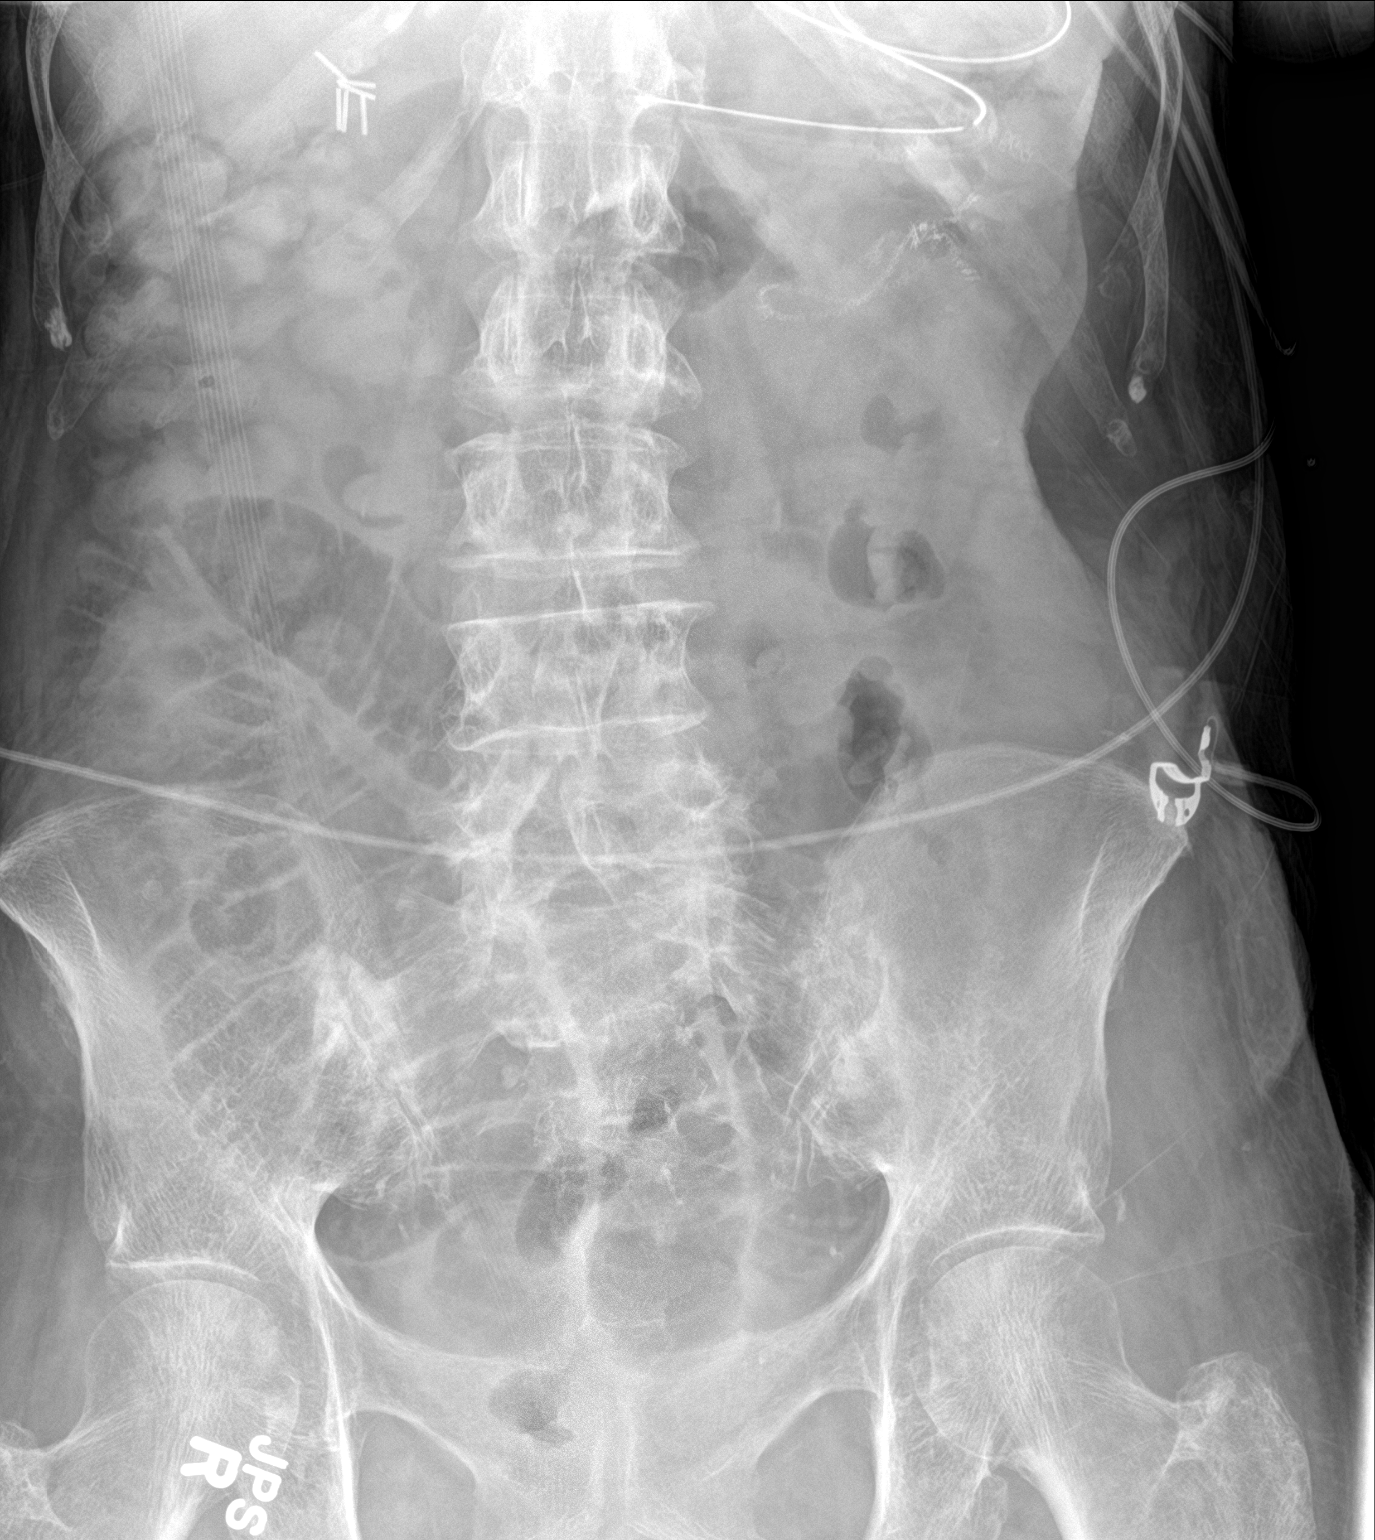

[abdomen decu]
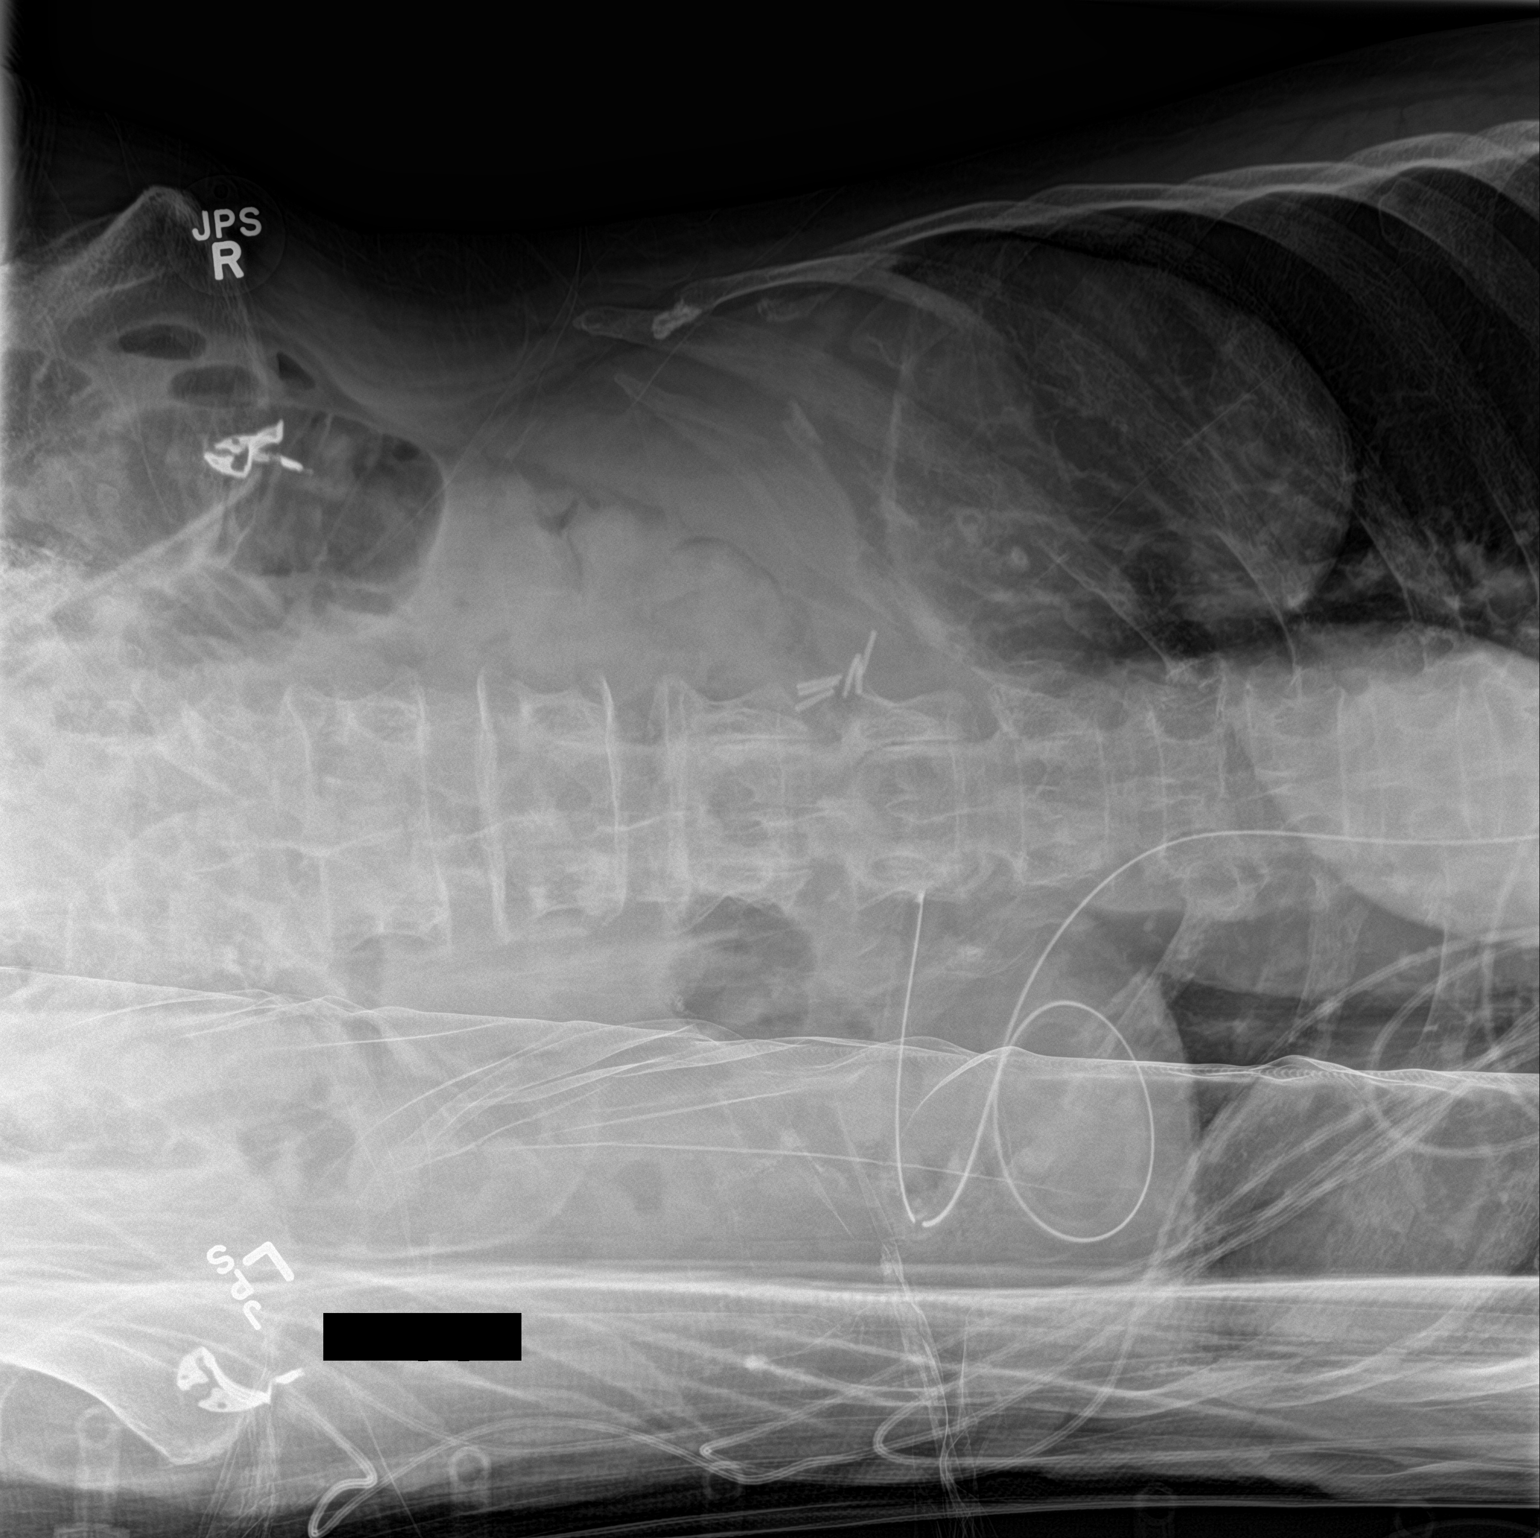

[2 of 2 positions shown; findings below may reference images not displayed]

FINDINGS: Enteric tube loops in the proximal stomach and terminates in the
body of the stomach. Surgical sutures are again seen in the left
upper quadrant. Mildly to moderately dilated small bowel loops in
the central and right abdomen measuring up to 4.4 cm diameter, not
appreciably changed. Mild stool throughout the colon. No evidence of
pneumatosis or pneumoperitoneum.
IMPRESSION: 1. Enteric tube loops in the proximal stomach and terminates in the
body of the stomach.
2. No appreciable change in mild-to-moderate small bowel dilatation,
in keeping with persistent distal small bowel obstruction.

## 2017-06-21 ENCOUNTER — Ambulatory Visit (INDEPENDENT_AMBULATORY_CARE_PROVIDER_SITE_OTHER): Payer: Medicare Other | Admitting: Physician Assistant

## 2017-06-21 ENCOUNTER — Encounter: Payer: Self-pay | Admitting: Physician Assistant

## 2017-06-21 VITALS — BP 110/52 | HR 71 | Ht 65.0 in | Wt 144.0 lb

## 2017-06-21 DIAGNOSIS — R0789 Other chest pain: Secondary | ICD-10-CM

## 2017-06-21 DIAGNOSIS — I70235 Atherosclerosis of native arteries of right leg with ulceration of other part of foot: Secondary | ICD-10-CM | POA: Diagnosis not present

## 2017-06-21 DIAGNOSIS — R142 Eructation: Secondary | ICD-10-CM | POA: Diagnosis not present

## 2017-06-21 DIAGNOSIS — H903 Sensorineural hearing loss, bilateral: Secondary | ICD-10-CM | POA: Diagnosis not present

## 2017-06-21 IMAGING — CR DG ABD PORTABLE 1V
1 series · 1 of 1 positions shown · non-contrast
Comparison: 01/18/2015

CLINICAL DATA: Small bowel obstruction

EXAM:
PORTABLE ABDOMEN - 1 VIEW

[AP]
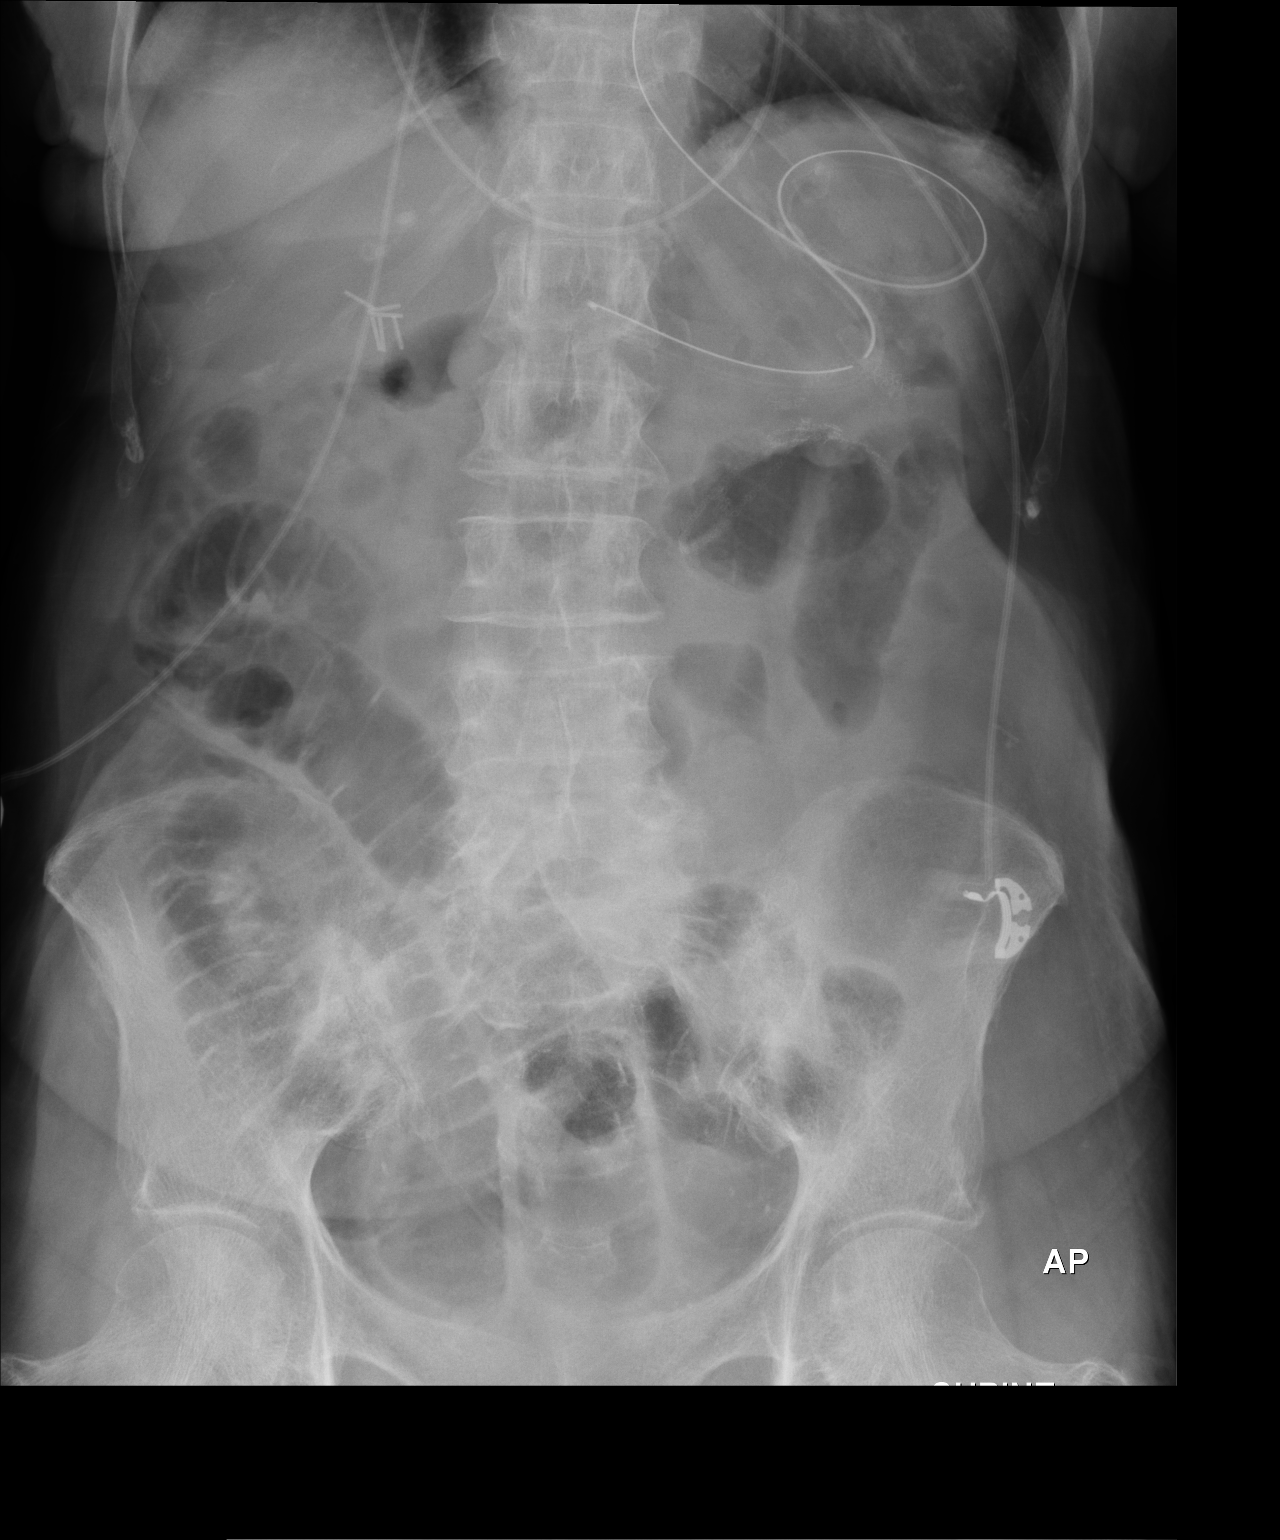

[1 of 1 positions shown; findings below may reference images not displayed]

FINDINGS: Disproportionally dilated loops of bowel are stable. There is some
colonic gas. Stable NG tube in the body of the stomach.
IMPRESSION: Stable partial small bowel obstruction pattern.

## 2017-06-21 NOTE — Patient Instructions (Signed)
If you are age 82 or older, your body mass index should be between 23-30. Your Body mass index is 23.96 kg/m. If this is out of the aforementioned range listed, please consider follow up with your Primary Care Provider.  If you are age 66 or younger, your body mass index should be between 19-25. Your Body mass index is 23.96 kg/m. If this is out of the aformentioned range listed, please consider follow up with your Primary Care Provider.   Follow up as needed.

## 2017-06-21 NOTE — Progress Notes (Signed)
Chief Complaint: Follow-up ER visit for atypical chest pain and eructations  HPI:    Shannon [name] is a 84 year old African-American female with a past medical history as listed below including nonobstructive CAD, CHF, pyloric stenosis requiring GJ tube, who follows with Dr. Henrene Pastor for gastroparesis, and presents to clinic today for follow-up after recent ER visit for chest pain.    06/06/17 in the ED notes show patient describes chest discomfort for the past few days.  Worse with swallowing.  BMP with a creatinine of 1.67, CBC with hemoglobin of 9.5, BNP elevated to 260.5 and troponin normal.  CT showed no acute abnormality within the abdomen or pelvis.  Percutaneous GJ tube with J arm extension within the proximal small bowel.  Stable right paramedian ventral abdominal hernia containing a segment of colon without evidence of obstruction.  Stable left paramedian ventral abdominal hernia containing omental fat.  Colonic diverticular disease without CT evidence of inflammation.  Stable chronic left lower lobe atelectasis scarring without interval change.  Coronary artery calcifications.  Aortic sclerosis.  It was thought patient's symptoms were likely GI related as it is exaggerated by p.o. intake.    Today, presents accompanied by her daughter who explains patient was at a routine check of her GJ tube and described some chest discomfort and burping.  They took her to the ER with workup as above.  Since returning home patient's daughter tells me they have started to separate the patient's pills out more.  Apparently, she was taking a handful of pills at one time and this is when her symptoms would start, they think maybe they were getting caught part of the way down, since taking the pills one at a time with enough water she has had no further symptoms.    Denies fever, chills, weight loss, anorexia, nausea, vomiting or heartburn or reflux.  Past Medical History:  Diagnosis Date  . Acute respiratory failure  ([place])   . Anemia    previous blood transfusions  . Arthritis    "all over"  . Asthma   . Bradycardia    requiring previous d/c of BB and reduction of amiodarone  . CAD (coronary artery disease)    nonobstructive per notes  . Chronic diastolic CHF (congestive heart failure) ([place])   . CKD (chronic kidney disease), stage III ([place])   . Complication of blood transfusion    "got the wrong blood type at [facility] in ~ 2015; no adverse reaction that we are aware of"/daughter, [name] (01/27/2016)  . COPD (chronic obstructive pulmonary disease) ([place])   . Depression    "light case"  . DVT (deep venous thrombosis) ([place]) 01/2016   a. LLE DVT 01/2016 - switched from Eliquis to Coumadin.  . Gastric stenosis    a. s/p stomach tube  . GERD (gastroesophageal reflux disease)   . History of blood transfusion    "several" (01/27/2016)  . History of stomach ulcers   . Hyperlipidemia   . Hypertension   . Hypothyroidism   . Paraesophageal hernia   . Perforated gastric ulcer ([place])   . Seasonal allergies   . SIADH (syndrome of inappropriate ADH production) ([place])    [name] 01/10/2015  . Small bowel obstruction ([place])    "I don't know how many" (01/11/2015)  . Stroke ([place])    "light one"  . Type II diabetes mellitus ([place])    "related to prednisone use  for > 20 yr; once predinose stopped; no more DM RX" (01/27/2016)  .  UTI (urinary tract infection) 02/08/2016  . Ventral hernia with bowel obstruction     Past Surgical History:  Procedure Laterality Date  . ABDOMINAL AORTOGRAM N/A 09/21/2016   Procedure: Abdominal Aortogram;  Surgeon: Waynetta Sandy, MD;  Location: [facility];  Service: Cardiovascular;  Laterality: N/A;  . AMPUTATION Left 09/25/2016   Procedure: AMPUTATION DIGIT- LEFT 2ND AND 3RD TOES;  Surgeon: Rosetta Posner, MD;  Location: [facility];  Service: Vascular;  Laterality: Left;  . CATARACT EXTRACTION W/ INTRAOCULAR LENS  IMPLANT, BILATERAL    . CHOLECYSTECTOMY OPEN    .  COLECTOMY    . ESOPHAGOGASTRODUODENOSCOPY N/A 01/19/2014   Procedure: ESOPHAGOGASTRODUODENOSCOPY (EGD);  Surgeon: Irene Shipper, MD;  Location: [facility];  Service: Endoscopy;  Laterality: N/A;  . ESOPHAGOGASTRODUODENOSCOPY N/A 01/20/2014   Procedure: ESOPHAGOGASTRODUODENOSCOPY (EGD);  Surgeon: Irene Shipper, MD;  Location: [facility];  Service: Endoscopy;  Laterality: N/A;  . ESOPHAGOGASTRODUODENOSCOPY N/A 03/19/2014   Procedure: ESOPHAGOGASTRODUODENOSCOPY (EGD);  Surgeon: Milus Banister, MD;  Location: [facility];  Service: Endoscopy;  Laterality: N/A;  . ESOPHAGOGASTRODUODENOSCOPY N/A 07/08/2015   Procedure: ESOPHAGOGASTRODUODENOSCOPY (EGD);  Surgeon: Doran Stabler, MD;  Location: [facility];  Service: Endoscopy;  Laterality: N/A;  . ESOPHAGOGASTRODUODENOSCOPY (EGD) WITH PROPOFOL N/A 09/15/2015   Procedure: ESOPHAGOGASTRODUODENOSCOPY (EGD) WITH PROPOFOL;  Surgeon: Manus Gunning, MD;  [facility];  Service: Gastroenterology;  Laterality: N/A;  . GASTROJEJUNOSTOMY     hx/notes 01/10/2015  . GASTROJEJUNOSTOMY N/A 09/23/2015   Procedure: OPEN GASTROJEJUNOSTOMY TUBE PLACEMENT;  Surgeon: Arta Bruce Kinsinger, MD;  Location: [facility];  Service: General;  Laterality: N/A;  . GLAUCOMA SURGERY Bilateral   . HERNIA REPAIR  2015  . IR CM INJ ANY COLONIC TUBE W/FLUORO  01/05/2017  . IR CM INJ ANY COLONIC TUBE W/FLUORO  06/07/2017  . IR GENERIC HISTORICAL  01/07/2016   IR GJ TUBE CHANGE 01/07/2016 Jacqulynn Cadet, MD [facility]  . IR GENERIC HISTORICAL  01/27/2016   IR MECH REMOV OBSTRUC MAT ANY COLON TUBE W/FLUORO 01/27/2016 Markus Daft, MD [facility]  . IR GENERIC HISTORICAL  02/07/2016   IR PATIENT EVAL TECH 0-60 MINS Darrell K Allred, PA-C [facility]  . IR GENERIC HISTORICAL  02/08/2016   IR GJ TUBE CHANGE 02/08/2016 Greggory Keen, MD [facility]  . IR GENERIC HISTORICAL  01/06/2016   IR GJ TUBE CHANGE 01/06/2016 [facility] OUT REF  . IR GENERIC HISTORICAL  05/02/2016   IR  CM INJ ANY COLONIC TUBE W/FLUORO 05/02/2016 Arne Cleveland, MD [facility]  . IR GENERIC HISTORICAL  05/15/2016   IR GJ TUBE CHANGE 05/15/2016 Sandi Mariscal, MD [facility]  . IR GENERIC HISTORICAL  06/28/2016   IR GJ TUBE CHANGE 06/28/2016 [facility]  . IR GJ TUBE CHANGE  02/20/2017  . IR GJ TUBE CHANGE  05/10/2017  . IR PATIENT EVAL TECH 0-60 MINS  10/19/2016  . IR PATIENT EVAL TECH 0-60 MINS  12/25/2016  . IR PATIENT EVAL TECH 0-60 MINS  01/29/2017  . IR PATIENT EVAL TECH 0-60 MINS  04/04/2017  . IR PATIENT EVAL TECH 0-60 MINS  04/30/2017  . IR REPLC DUODEN/JEJUNO TUBE PERCUT W/FLUORO  11/14/2016  . LAPAROTOMY N/A 01/20/2015   Procedure: EXPLORATORY LAPAROTOMY;  Surgeon: Coralie Keens, MD;  Location: [facility];  Service: General;  Laterality: N/A;  . LOWER EXTREMITY ANGIOGRAPHY Left 09/21/2016   Procedure: Lower Extremity Angiography;  Surgeon: Waynetta Sandy, MD;  Location: [facility];  Service:  Cardiovascular;  Laterality: Left;  . LYSIS OF ADHESION N/A 01/20/2015   Procedure: LYSIS OF ADHESIONS < 1 HOUR;  Surgeon: Coralie Keens, MD;  Location: [facility];  Service: General;  Laterality: N/A;  . PERIPHERAL VASCULAR BALLOON ANGIOPLASTY Left 09/21/2016   Procedure: Peripheral Vascular Balloon Angioplasty;  Surgeon: Waynetta Sandy, MD;  Location: [facility];  Service: Cardiovascular;  Laterality: Left;  drug coated balloon  . TONSILLECTOMY    . TUBAL LIGATION    . VENTRAL HERNIA REPAIR  2015   incarcerated ventral hernia ([facility] 09/2013)/notes 01/10/2015    Current Outpatient Medications  Medication Sig Dispense Refill  . acetaminophen (TYLENOL) 500 MG tablet Take 1,000 mg by mouth every 6 (six) hours as needed (pain).    . AMINO ACIDS-PROTEIN HYDROLYS PO Take 30 mLs by mouth 2 (two) times daily.    [name] atorvastatin (LIPITOR) 10 MG tablet TAKE 1 TABLET BY MOUTH EVERY DAY 90 tablet 1  . b complex vitamins tablet Take 1 tablet by mouth daily.     . calcium-vitamin D  (OSCAL WITH D) 500-200 MG-UNIT per tablet Take 1 tablet by mouth daily with breakfast.     . carvedilol (COREG) 6.25 MG tablet TAKE 2 TABS BY MOUTH IN AM AND 1 TAB BY MOUTH IN PM FOR BLOOD PRESSURE 180 tablet 1  . collagenase (SANTYL) ointment Apply 1 application topically daily. (Patient not taking: Reported on 06/06/2017) 30 g 1  . cycloSPORINE (RESTASIS) 0.05 % ophthalmic emulsion USE 1 DROP INTO BOTH EYES TWICE DAILY 60 mL 3  . feeding supplement (OSMOLITE 1 CAL) LIQD Take 711 mLs by mouth at bedtime.     . ferrous sulfate 325 (65 FE) MG tablet Take one tablet by mouth once daily with breakfast 90 tablet 1  . fluticasone (FLONASE) 50 MCG/ACT nasal spray Place 2 sprays into both nostrils daily. 16 g 3  . furosemide (LASIX) 40 MG tablet Take 1 tablet (40 mg total) by mouth daily. 30 tablet 11  . gabapentin (NEURONTIN) 300 MG capsule TAKE 2 CAPSULES (600 MG TOTAL) BY MOUTH 3 (THREE) TIMES DAILY. 180 capsule 1  . gentamicin cream (GARAMYCIN) 0.1 % Apply 1 application topically 3 (three) times daily. (Patient not taking: Reported on 06/06/2017) 30 g 1  . hydrALAZINE (APRESOLINE) 50 MG tablet Take 1 tablet (50 mg total) by mouth 3 (three) times daily. 270 tablet 1  . insulin aspart (NOVOLOG FLEXPEN) 100 UNIT/ML FlexPen Take three times daily before each meal based on sliding scale 140-199( 2 units), 200-250 (4 units), 251-299 (6 units), 300-349 (8 units), > 350(10 units) 15 pen 11  . Insulin Glargine (LANTUS) 100 UNIT/ML Solostar Pen Injection 0.1 ml (10 units) once daily to control blood sugar. Dx E11.9 15 mL 5  . Insulin Pen Needle (B-D UF III MINI PEN NEEDLES) 31G X 5 MM MISC Use twice daily with giving insulin injections. Dx E11.9 300 each 3  . ipratropium-albuterol (DUONEB) 0.5-2.5 (3) MG/3ML SOLN Take 3 mLs by nebulization every 6 (six) hours as needed (sob). Use 3 times daily x 4 days then every 6 hours as needed. 360 mL 4  . isosorbide mononitrate (IMDUR) 30 MG 24 hr tablet Take 2 tablets (60 mg  total) by mouth daily. 180 tablet 2  . latanoprost (XALATAN) 0.005 % ophthalmic solution Place 1 drop into both eyes at bedtime. 2.5 mL 12  . levalbuterol (XOPENEX HFA) 45 MCG/ACT inhaler Inhale 2 puffs into the lungs every 6 (six) hours as needed  for wheezing or shortness of breath. 1 Inhaler 12  . levothyroxine (SYNTHROID, LEVOTHROID) 137 MCG tablet TAKE 1 TABLET (137 MCG TOTAL) BY MOUTH DAILY BEFORE BREAKFAST. 30 tablet 3  . Maltodextrin-Xanthan Gum (RESOURCE THICKENUP CLEAR) POWD Take 120 g by mouth as needed (use to thicken liquids.). (Patient not taking: Reported on 06/06/2017) 20 Can 0  . mometasone-formoterol (DULERA) 200-5 MCG/ACT AERO Inhale 2 puffs into the lungs 2 (two) times daily. 13 g 6  . Multiple Vitamin (MULTIVITAMIN WITH MINERALS) TABS tablet Take 1 tablet by mouth daily.    . nitrofurantoin (MACRODANTIN) 100 MG capsule Place 1 capsule (100 mg total) into feeding tube every 12 (twelve) hours. X 3 days (Patient not taking: Reported on 06/06/2017) 6 capsule 0  . nystatin (MYCOSTATIN/NYSTOP) powder Apply topically 3 (three) times daily. (Patient taking differently: Apply 1 g topically 2 (two) times daily as needed (irritation). ) 15 g 0  . oxyCODONE (ROXICODONE) 5 MG immediate release tablet Take 1 tablet (5 mg total) by mouth every 4 (four) hours as needed for severe pain. May take 1/2 tab q4hrs as needed for mild pain. 30 tablet 0  . OXYGEN Inhale 2 L into the lungs at bedtime.    . pantoprazole (PROTONIX) 40 MG tablet Take 1 tablet (40 mg total) by mouth 2 (two) times daily. 60 tablet 3  . Polyethyl Glycol-Propyl Glycol 0.4-0.3 % SOLN Place 1 drop into both eyes 4 (four) times daily.     . sertraline (ZOLOFT) 100 MG tablet TAKE ONE TABLET BY MOUTH ONCE DAILY FOR DEPRESSION 90 tablet 1  . warfarin (COUMADIN) 2.5 MG tablet TAKE AS DIRECTED BY COUMADIN CLINIC. (Patient taking differently: Take 5 mg every day except Friday take 2.5 mg by mouth.) 65 tablet 1  . Water For Irrigation, Sterile  (FREE WATER) SOLN Place 60 mLs into feeding tube 3 (three) times daily.     Dema Severin Petrolatum-Mineral Oil (SYSTANE NIGHTTIME) OINT Apply 1 application to eye at bedtime. Both eyes     No current facility-administered medications for this visit.     Allergies as of 06/21/2017 - Review Complete 06/07/2017  Allergen Reaction Noted  . Penicillins Itching, Rash, and Other (See Comments) 02/12/2016  . Other  08/14/2016    Family History  Problem Relation Age of Onset  . Stroke Mother   . Hypertension Mother   . Diabetes Brother   . Glaucoma Son   . Glaucoma Son   . Heart attack Neg Hx     Social History   Socioeconomic History  . Marital status: Widowed    Spouse name: Not on file  . Number of children: 3  . Years of education: Not on file  . Highest education level: Not on file  Occupational History  . Not on file  Social Needs  . Financial resource strain: Not on file  . Food insecurity:    Worry: Not on file    Inability: Not on file  . Transportation needs:    Medical: Not on file    Non-medical: Not on file  Tobacco Use  . Smoking status: Never Smoker  . Smokeless tobacco: Former Systems developer    Types: Snuff  . Tobacco comment: "used snuff in my younger days"  Substance and Sexual Activity  . Alcohol use: No    Alcohol/week: 0.0 oz  . Drug use: No  . Sexual activity: Never  Lifestyle  . Physical activity:    Days per week: Not on file  Minutes per session: Not on file  . Stress: Not on file  Relationships  . Social connections:    Talks on phone: Not on file    Gets together: Not on file    Attends religious service: Not on file    Active member of club or organization: Not on file    Attends meetings of clubs or organizations: Not on file    Relationship status: Not on file  . Intimate partner violence:    Fear of current or ex partner: Not on file    Emotionally abused: Not on file    Physically abused: Not on file    Forced sexual activity: Not on file    Other Topics Concern  . Not on file  Social History Narrative   Married in [place], lives in one story house with 2 people and no pets   Occupation: ?   Has a living Will, [place], and doesn't have DNR   Was living with Husband (in frail health) in [place] until 09/2013.  After her [facility] admission they both came to live with daughter in [place]:    Constitutional: No weight loss, fever or chills Cardiovascular: No chest pain Respiratory: No SOB  Gastrointestinal: See HPI and otherwise negative   Physical Exam:  Vital signs: BP (!) 110/52   Pulse 71   Ht 5\' 5"  (1.651 m)   Wt 144 lb (65.3 kg)   BMI 23.96 kg/m   Constitutional:   Pleasant AA female appears to be in NAD, Well developed, Well nourished, alert and cooperative Respiratory: Respirations even and unlabored. Lungs clear to auscultation bilaterally.   No wheezes, crackles, or rhonchi.  Cardiovascular: Normal S1, S2. No MRG. Regular rate and rhythm. No peripheral edema, cyanosis or pallor.  Gastrointestinal:  Soft, nondistended, nontender. No rebound or guarding. Normal bowel sounds. No appreciable masses or hepatomegaly. Psychiatric: Demonstrates good judgement and reason without abnormal affect or behaviors.  RELEVANT LABS AND IMAGING: CBC    Component Value Date/Time   WBC 7.0 06/06/2017 1755   RBC 3.19 (L) 06/06/2017 1755   HGB 9.5 (L) 06/06/2017 1755   HGB 10.5 (L) 06/27/2016 1054   HCT 29.0 (L) 06/06/2017 1755   HCT 32.0 (L) 06/27/2016 1054   PLT 282 06/06/2017 1755   PLT 368 06/27/2016 1054   MCV 90.9 06/06/2017 1755   MCV 89 06/27/2016 1054   MCH 29.8 06/06/2017 1755   MCHC 32.8 06/06/2017 1755   RDW 16.3 (H) 06/06/2017 1755   RDW 21.1 (H) 06/27/2016 1054   LYMPHSABS 1.3 02/23/2017 0330   LYMPHSABS 1.2 10/06/2015 1151   MONOABS 0.7 02/23/2017 0330   EOSABS 1.0 (H) 02/23/2017 0330   EOSABS 2.4 (H) 10/06/2015 1151   BASOSABS 0.0 02/23/2017 0330   BASOSABS 0.1 10/06/2015 1151     CMP     Component Value Date/Time   NA 139 06/06/2017 1755   NA 133 (A) 06/30/2016   K 4.1 06/06/2017 1755   CL 102 06/06/2017 1755   CO2 28 06/06/2017 1755   GLUCOSE 103 (H) 06/06/2017 1755   BUN 73 (H) 06/06/2017 1755   BUN 59 (A) 06/30/2016   CREATININE 1.67 (H) 06/06/2017 1755   CREATININE 1.10 (H) 10/03/2016 1610   CALCIUM 9.5 06/06/2017 1755   PROT 7.0 11/02/2016 1710   PROT 7.1 10/06/2015 1151   ALBUMIN 3.5 11/02/2016 1710   ALBUMIN 3.5 10/06/2015 1151   AST 24 11/02/2016 1710   ALT  19 11/02/2016 1710   ALKPHOS 58 11/02/2016 1710   BILITOT 0.5 11/02/2016 1710   BILITOT 0.2 10/06/2015 1151   GFRNONAA 26 (L) 06/06/2017 1755   GFRNONAA 44 (L) 10/03/2016 1610   GFRAA 30 (L) 06/06/2017 1755   GFRAA 51 (L) 10/03/2016 1610   Ct Abdomen Pelvis Wo Contrast 06/06/17  Result Date: 06/06/2017 CLINICAL DATA:  84 year old female with new onset chest pain EXAM: CT ABDOMEN AND PELVIS WITHOUT CONTRAST TECHNIQUE: Multidetector CT imaging of the abdomen and pelvis was performed following the standard protocol without IV contrast. COMPARISON:  Prior CT scan of the abdomen and pelvis 02/20/2017 FINDINGS: Lower chest: Stable peribronchovascular distribution of airspace opacification in the posterior inferior left lower lobe. Likely impaction of the supplying bronchials. There is no interval change compared to 02/20/2017. Otherwise, the visualized lower lungs are clear. Borderline cardiomegaly. Calcification of the inferior left ventricular papillary muscles consistent with prior myocardial infarction. Extensive atherosclerotic calcifications in the visualized thoracic aorta. Calcifications also noted along the coronary arteries. No pericardial effusion. Small hiatal hernia. Hepatobiliary: Stable appearance of a multifocal circumscribed low-attenuation hepatic lesions which are incompletely characterized in the absence of intravenous contrast but remain unchanged compared to prior imaging and are  therefore favored to represent benign cysts. Scattered punctate calcifications consistent with old granulomatous disease. The gallbladder is surgically absent. No intra or extrahepatic biliary ductal dilatation. Pancreas: Unremarkable. No pancreatic ductal dilatation or surrounding inflammatory changes. Spleen: Normal in size without focal abnormality. Adrenals/Urinary Tract: Unremarkable adrenal glands. No hydronephrosis or nephrolithiasis. Ureters and bladder are unremarkable. Stomach/Bowel: Percutaneous gastrojejunostomy tube. The J arm terminates in the proximal small bowel (third portion of the duodenum). No evidence of bowel obstruction. Colonic diverticular disease without CT evidence of active inflammation. Stable appearance of midline ventral abdominal hernias containing colon to the right of the umbilicus and containing herniated omental fat to the left of the umbilicus. Vascular/Lymphatic: Limited evaluation in the absence of intravenous contrast. No aneurysm. Extensive atherosclerotic vascular calcifications. Reproductive: Uterus and bilateral adnexa are unremarkable. Other: Abdominal wall hernias as described above. No interval change compared to 02/20/2017. No ascites. Musculoskeletal: No acute fracture or aggressive appearing lytic or blastic osseous lesion. Stable T11 compression fracture with approximately 40% height loss. IMPRESSION: 1. No acute abnormality within the abdomen or pelvis. 2. Percutaneous gastrojejunostomy tube. The J arm extension tip is within the proximal small bowel (third portion of the duodenum). 3. Stable right paramedian ventral abdominal hernia containing a segment of colon without evidence of obstruction. 4. Stable left paramedian ventral abdominal hernia containing omental fat. 5. Colonic diverticular disease without CT evidence of active inflammation. 6. Stable chronic left lower lobe atelectasis/pleuroparenchymal scarring without interval change compared to 02/20/2017. 7.  Coronary artery calcifications. 8.  Aortic Atherosclerosis (ICD10-170.0) 9. Additional ancillary findings as above without significant interval change. Electronically Signed   By: Jacqulynn Cadet M.D.   On: 06/06/2017 21:19   EXAM: GI TUBE INJECTION 06/07/17  MEDICATIONS: None  ANESTHESIA/SEDATION: None  CONTRAST:  19mL ISOVUE-300 IOPAMIDOL (ISOVUE-300) INJECTION 61% - administered into the gastric lumen.  FLUOROSCOPY TIME:  0.2 minutes; 55  uGym2 DAP  COMPLICATIONS: None immediate.  PROCEDURE: Informed written consent was obtained from the patient after a thorough discussion of the procedural risks, benefits and alternatives. All questions were addressed. A timeout was performed prior to the initiation of the procedure.  On scout imaging, there is no kink or fracture of the catheter evident. Contrast injection demonstrates wide patency of the jejunal limb of the  GJ tube. Tip is in the distal duodenum. No extravasation or leak.  IMPRESSION: 1. Patent jejunal lumen of GJ tube, okay for routine use.   Electronically Signed   By: Lucrezia Europe M.D.   On: 06/08/2017 10:11  Assessment: 1.  Atypical chest pain: After taking pills altogether in a handful, since separating pills patient no longer complains of this chest discomfort and is having no further eructations 2.  Eructations  Plan: 1.  Patient's symptoms have resolved since her family has started to separate her pills so she is taking them one at a time instead of altogether.  Encouraged them to keep doing this. 2.  Patient to follow clinic as needed with Dr. Henrene Pastor or myself.  Ellouise Newer, PA-C [facility] 06/21/2017, 1:31 PM  Cc: Gildardo Cranker, DO

## 2017-06-22 ENCOUNTER — Ambulatory Visit: Payer: Medicare Other | Admitting: Internal Medicine

## 2017-06-22 ENCOUNTER — Encounter: Payer: Self-pay | Admitting: *Deleted

## 2017-06-22 ENCOUNTER — Encounter: Payer: Self-pay | Admitting: Vascular Surgery

## 2017-06-22 ENCOUNTER — Telehealth: Payer: Self-pay | Admitting: Pharmacist

## 2017-06-22 ENCOUNTER — Other Ambulatory Visit: Payer: Self-pay | Admitting: *Deleted

## 2017-06-22 ENCOUNTER — Other Ambulatory Visit: Payer: Self-pay

## 2017-06-22 ENCOUNTER — Ambulatory Visit (INDEPENDENT_AMBULATORY_CARE_PROVIDER_SITE_OTHER): Payer: Medicare Other | Admitting: Vascular Surgery

## 2017-06-22 VITALS — BP 183/75 | HR 72 | Temp 97.4°F | Resp 18 | Ht 65.0 in | Wt 162.0 lb

## 2017-06-22 DIAGNOSIS — I739 Peripheral vascular disease, unspecified: Secondary | ICD-10-CM

## 2017-06-22 NOTE — Progress Notes (Signed)
Patient ID: Occie Delucia, female   DOB: 02/09/1924, 84 y.o.   MRN: [id number]  Reason for Consult: Follow-up (schemic R foot with non healing ulcers - ref by Dr. Logan Bores)   Referred by Kirt Boys, DO  Subjective:     HPI:  Arthella Vernet is a 84 y.o. female has previously undergone drug-coated balloon angioplasty of her left SFA followed by amputation of 2 toes which have now healed.  She has known renal insufficiency and only the left lower extremity was evaluated during her first angiogram.  She is now being seen by Dr. Logan Bores with podiatry for distal gangrene of toes on the right side.  She is on Coumadin.  Antiplatelet drugs have been stopped.  She does continue to walk.  She has pain in the toes that are affected on the right side.  Past Medical History:  Diagnosis Date  . Acute respiratory failure (HCC)   . Anemia    previous blood transfusions  . Arthritis    "all over"  . Asthma   . Bradycardia    requiring previous d/c of BB and reduction of amiodarone  . CAD (coronary artery disease)    nonobstructive per notes  . Chronic diastolic CHF (congestive heart failure) (HCC)   . CKD (chronic kidney disease), stage III (HCC)   . Complication of blood transfusion    "got the wrong blood type at [facility] in ~ 2015; no adverse reaction that we are aware of"/daughter, [name] (01/27/2016)  . COPD (chronic obstructive pulmonary disease) (HCC)   . Depression    "light case"  . DVT (deep venous thrombosis) (HCC) 01/2016   a. LLE DVT 01/2016 - switched from Eliquis to Coumadin.  . Gastric stenosis    a. s/p stomach tube  . GERD (gastroesophageal reflux disease)   . History of blood transfusion    "several" (01/27/2016)  . History of stomach ulcers   . Hyperlipidemia   . Hypertension   . Hypothyroidism   . Paraesophageal hernia   . Perforated gastric ulcer (HCC)   . Seasonal allergies   . SIADH (syndrome of inappropriate ADH production) (HCC)    [name] 01/10/2015  . Small bowel  obstruction (HCC)    "I don't know how many" (01/11/2015)  . Stroke (HCC)    "light one"  . Type II diabetes mellitus (HCC)    "related to prednisone use  for > 20 yr; once predinose stopped; no more DM RX" (01/27/2016)  . UTI (urinary tract infection) 02/08/2016  . Ventral hernia with bowel obstruction    Family History  Problem Relation Age of Onset  . Stroke Mother   . Hypertension Mother   . Diabetes Brother   . Glaucoma Son   . Glaucoma Son   . Heart attack Neg Hx   . Colon cancer Neg Hx   . Stomach cancer Neg Hx    Past Surgical History:  Procedure Laterality Date  . ABDOMINAL AORTOGRAM N/A 09/21/2016   Procedure: Abdominal Aortogram;  Surgeon: Maeola Harman, MD;  Location: [facility] INVASIVE CV LAB;  Service: Cardiovascular;  Laterality: N/A;  . AMPUTATION Left 09/25/2016   Procedure: AMPUTATION DIGIT- LEFT 2ND AND 3RD TOES;  Surgeon: Larina Earthly, MD;  Location: [facility] OR;  Service: Vascular;  Laterality: Left;  . CATARACT EXTRACTION W/ INTRAOCULAR LENS  IMPLANT, BILATERAL    . CHOLECYSTECTOMY OPEN    . COLECTOMY    . ESOPHAGOGASTRODUODENOSCOPY N/A 01/19/2014   Procedure: ESOPHAGOGASTRODUODENOSCOPY (EGD);  Surgeon: Jonny Ruiz  Johnney Ou, MD;  Location: [facility] ENDOSCOPY;  Service: Endoscopy;  Laterality: N/A;  . ESOPHAGOGASTRODUODENOSCOPY N/A 01/20/2014   Procedure: ESOPHAGOGASTRODUODENOSCOPY (EGD);  Surgeon: Hilarie Fredrickson, MD;  Location: [facility] ENDOSCOPY;  Service: Endoscopy;  Laterality: N/A;  . ESOPHAGOGASTRODUODENOSCOPY N/A 03/19/2014   Procedure: ESOPHAGOGASTRODUODENOSCOPY (EGD);  Surgeon: Rachael Fee, MD;  Location: [facility] ENDOSCOPY;  Service: Endoscopy;  Laterality: N/A;  . ESOPHAGOGASTRODUODENOSCOPY N/A 07/08/2015   Procedure: ESOPHAGOGASTRODUODENOSCOPY (EGD);  Surgeon: Sherrilyn Rist, MD;  Location: [facility] ENDOSCOPY;  Service: Endoscopy;  Laterality: N/A;  . ESOPHAGOGASTRODUODENOSCOPY (EGD) WITH PROPOFOL N/A 09/15/2015   Procedure: ESOPHAGOGASTRODUODENOSCOPY (EGD) WITH PROPOFOL;   Surgeon: Ruffin Frederick, MD;  Location: [facility] ENDOSCOPY;  Service: Gastroenterology;  Laterality: N/A;  . GASTROJEJUNOSTOMY     hx/notes 01/10/2015  . GASTROJEJUNOSTOMY N/A 09/23/2015   Procedure: OPEN GASTROJEJUNOSTOMY TUBE PLACEMENT;  Surgeon: De Blanch Kinsinger, MD;  Location: [facility] ORS;  Service: General;  Laterality: N/A;  . GLAUCOMA SURGERY Bilateral   . HERNIA REPAIR  2015  . IR CM INJ ANY COLONIC TUBE W/FLUORO  01/05/2017  . IR CM INJ ANY COLONIC TUBE W/FLUORO  06/07/2017  . IR GENERIC HISTORICAL  01/07/2016   IR GJ TUBE CHANGE 01/07/2016 Malachy Moan, MD [facility]  . IR GENERIC HISTORICAL  01/27/2016   IR MECH REMOV OBSTRUC MAT ANY COLON TUBE W/FLUORO 01/27/2016 Richarda Overlie, MD [facility]  . IR GENERIC HISTORICAL  02/07/2016   IR PATIENT EVAL TECH 0-60 MINS Darrell K Allred, PA-C [facility]  . IR GENERIC HISTORICAL  02/08/2016   IR GJ TUBE CHANGE 02/08/2016 Berdine Dance, MD [facility]  . IR GENERIC HISTORICAL  01/06/2016   IR GJ TUBE CHANGE 01/06/2016 [facility] OUT REF  . IR GENERIC HISTORICAL  05/02/2016   IR CM INJ ANY COLONIC TUBE W/FLUORO 05/02/2016 Oley Balm, MD [facility]  . IR GENERIC HISTORICAL  05/15/2016   IR GJ TUBE CHANGE 05/15/2016 Simonne Come, MD [facility]  . IR GENERIC HISTORICAL  06/28/2016   IR GJ TUBE CHANGE 06/28/2016 [facility]  . IR GJ TUBE CHANGE  02/20/2017  . IR GJ TUBE CHANGE  05/10/2017  . IR PATIENT EVAL TECH 0-60 MINS  10/19/2016  . IR PATIENT EVAL TECH 0-60 MINS  12/25/2016  . IR PATIENT EVAL TECH 0-60 MINS  01/29/2017  . IR PATIENT EVAL TECH 0-60 MINS  04/04/2017  . IR PATIENT EVAL TECH 0-60 MINS  04/30/2017  . IR REPLC DUODEN/JEJUNO TUBE PERCUT W/FLUORO  11/14/2016  . LAPAROTOMY N/A 01/20/2015   Procedure: EXPLORATORY LAPAROTOMY;  Surgeon: Abigail Miyamoto, MD;  Location: [facility] OR;  Service: General;  Laterality: N/A;  . LOWER EXTREMITY ANGIOGRAPHY Left 09/21/2016   Procedure: Lower Extremity Angiography;  Surgeon: Maeola Harman, MD;  Location: [facility] INVASIVE CV LAB;  Service: Cardiovascular;  Laterality: Left;  . LYSIS OF ADHESION N/A 01/20/2015   Procedure: LYSIS OF ADHESIONS < 1 HOUR;  Surgeon: Abigail Miyamoto, MD;  Location: [facility] OR;  Service: General;  Laterality: N/A;  . PERIPHERAL VASCULAR BALLOON ANGIOPLASTY Left 09/21/2016   Procedure: Peripheral Vascular Balloon Angioplasty;  Surgeon: Maeola Harman, MD;  Location: [facility] INVASIVE CV LAB;  Service: Cardiovascular;  Laterality: Left;  drug coated balloon  . TONSILLECTOMY    . TUBAL LIGATION    . VENTRAL HERNIA REPAIR  2015   incarcerated ventral hernia ([facility] 09/2013)/notes 01/10/2015    Short Social History:  Social History   Tobacco Use  . Smoking status: Never Smoker  .  Smokeless tobacco: Former Neurosurgeon    Types: Snuff  . Tobacco comment: "used snuff in my younger days"  Substance Use Topics  . Alcohol use: No    Alcohol/week: 0.0 oz    Allergies  Allergen Reactions  . Penicillins Itching, Rash and Other (See Comments)    Tolerated unasyn, zosyn & cephalosporins in the past.  Has patient had a PCN reaction causing immediate rash, facial/tongue/throat swelling, SOB or lightheadedness with hypotension: Yes Has patient had a PCN reaction causing severe rash involving mucus membranes or skin necrosis: Unk Has patient had a PCN reaction that required hospitalization: Unk Has patient had a PCN reaction occurring within the last 10 years: Unk If all of the above answers are "NO", then may proceed with Cephalosporins  . Other     Patient tolerates amoxicillin and cephalosporins    Current Outpatient Medications  Medication Sig Dispense Refill  . acetaminophen (TYLENOL) 500 MG tablet Take 1,000 mg by mouth every 6 (six) hours as needed (pain).    . AMINO ACIDS-PROTEIN HYDROLYS PO Take 30 mLs by mouth 2 (two) times daily.    [name] atorvastatin (LIPITOR) 10 MG tablet TAKE 1 TABLET BY MOUTH EVERY DAY 90 tablet 1  . b complex vitamins tablet Take  1 tablet by mouth daily.     . calcium-vitamin D (OSCAL WITH D) 500-200 MG-UNIT per tablet Take 1 tablet by mouth daily with breakfast.     . carvedilol (COREG) 6.25 MG tablet TAKE 2 TABS BY MOUTH IN AM AND 1 TAB BY MOUTH IN PM FOR BLOOD PRESSURE 180 tablet 1  . collagenase (SANTYL) ointment Apply 1 application topically daily. (Patient not taking: Reported on 06/06/2017) 30 g 1  . cycloSPORINE (RESTASIS) 0.05 % ophthalmic emulsion USE 1 DROP INTO BOTH EYES TWICE DAILY 60 mL 3  . feeding supplement (OSMOLITE 1 CAL) LIQD Take 711 mLs by mouth at bedtime.     . ferrous sulfate 325 (65 FE) MG tablet Take one tablet by mouth once daily with breakfast 90 tablet 1  . fluticasone (FLONASE) 50 MCG/ACT nasal spray Place 2 sprays into both nostrils daily. 16 g 3  . furosemide (LASIX) 40 MG tablet Take 1 tablet (40 mg total) by mouth daily. 30 tablet 11  . gabapentin (NEURONTIN) 300 MG capsule TAKE 2 CAPSULES (600 MG TOTAL) BY MOUTH 3 (THREE) TIMES DAILY. 180 capsule 1  . gentamicin cream (GARAMYCIN) 0.1 % Apply 1 application topically 3 (three) times daily. (Patient not taking: Reported on 06/06/2017) 30 g 1  . hydrALAZINE (APRESOLINE) 50 MG tablet Take 1 tablet (50 mg total) by mouth 3 (three) times daily. 270 tablet 1  . insulin aspart (NOVOLOG FLEXPEN) 100 UNIT/ML FlexPen Take three times daily before each meal based on sliding scale 140-199( 2 units), 200-250 (4 units), 251-299 (6 units), 300-349 (8 units), > 350(10 units) 15 pen 11  . Insulin Glargine (LANTUS) 100 UNIT/ML Solostar Pen Injection 0.1 ml (10 units) once daily to control blood sugar. Dx E11.9 15 mL 5  . Insulin Pen Needle (B-D UF III MINI PEN NEEDLES) 31G X 5 MM MISC Use twice daily with giving insulin injections. Dx E11.9 300 each 3  . ipratropium-albuterol (DUONEB) 0.5-2.5 (3) MG/3ML SOLN Take 3 mLs by nebulization every 6 (six) hours as needed (sob). Use 3 times daily x 4 days then every 6 hours as needed. 360 mL 4  . isosorbide mononitrate  (IMDUR) 30 MG 24 hr tablet Take 2 tablets (60 mg total)  by mouth daily. 180 tablet 2  . latanoprost (XALATAN) 0.005 % ophthalmic solution Place 1 drop into both eyes at bedtime. 2.5 mL 12  . levalbuterol (XOPENEX HFA) 45 MCG/ACT inhaler Inhale 2 puffs into the lungs every 6 (six) hours as needed for wheezing or shortness of breath. 1 Inhaler 12  . levothyroxine (SYNTHROID, LEVOTHROID) 137 MCG tablet TAKE 1 TABLET (137 MCG TOTAL) BY MOUTH DAILY BEFORE BREAKFAST. 30 tablet 3  . Maltodextrin-Xanthan Gum (RESOURCE THICKENUP CLEAR) POWD Take 120 g by mouth as needed (use to thicken liquids.). (Patient not taking: Reported on 06/06/2017) 20 Can 0  . mometasone-formoterol (DULERA) 200-5 MCG/ACT AERO Inhale 2 puffs into the lungs 2 (two) times daily. 13 g 6  . Multiple Vitamin (MULTIVITAMIN WITH MINERALS) TABS tablet Take 1 tablet by mouth daily.    [name] nystatin (MYCOSTATIN/NYSTOP) powder Apply topically 3 (three) times daily. (Patient taking differently: Apply 1 g topically 2 (two) times daily as needed (irritation). ) 15 g 0  . oxyCODONE (ROXICODONE) 5 MG immediate release tablet Take 1 tablet (5 mg total) by mouth every 4 (four) hours as needed for severe pain. May take 1/2 tab q4hrs as needed for mild pain. (Patient not taking: Reported on 06/22/2017) 30 tablet 0  . OXYGEN Inhale 2 L into the lungs at bedtime.    . pantoprazole (PROTONIX) 40 MG tablet Take 1 tablet (40 mg total) by mouth 2 (two) times daily. 60 tablet 3  . Polyethyl Glycol-Propyl Glycol 0.4-0.3 % SOLN Place 1 drop into both eyes 4 (four) times daily.     . sertraline (ZOLOFT) 100 MG tablet TAKE ONE TABLET BY MOUTH ONCE DAILY FOR DEPRESSION 90 tablet 1  . warfarin (COUMADIN) 2.5 MG tablet TAKE AS DIRECTED BY COUMADIN CLINIC. (Patient taking differently: Take 5 mg every day except Friday take 2.5 mg by mouth.) 65 tablet 1  . Water For Irrigation, Sterile (FREE WATER) SOLN Place 60 mLs into feeding tube 3 (three) times daily.     Cliffton Asters  Petrolatum-Mineral Oil (SYSTANE NIGHTTIME) OINT Apply 1 application to eye at bedtime. Both eyes     No current facility-administered medications for this visit.     Review of Systems  Constitutional:  Constitutional negative. HENT: HENT negative.  Eyes: Eyes negative.  GI: Gastrointestinal negative.  Musculoskeletal: Musculoskeletal negative.  Skin: Positive for wound.  Neurological: Neurological negative. Hematologic: Hematologic/lymphatic negative.  Psychiatric: Psychiatric negative.        Objective:  Objective   Vitals:   06/22/17 1036 06/22/17 1042  BP: (!) 181/79 (!) 183/75  Pulse: 72   Resp: 18   Temp: (!) 97.4 F (36.3 C)   TempSrc: Oral   SpO2: 98%   Weight: 162 lb (73.5 kg)   Height: 5\' 5"  (1.651 m)    Body mass index is 26.96 kg/m.  Physical Exam  Constitutional: She is oriented to person, place, and time. She appears well-developed.  Eyes: Pupils are equal, round, and reactive to light.  Neck: Normal range of motion.  Cardiovascular: Normal rate.  Pulses:      Femoral pulses are 2+ on the right side, and 2+ on the left side.      Popliteal pulses are 0 on the right side, and 2+ on the left side.  Pulmonary/Chest: Effort normal.  Abdominal: Soft.  Neurological: She is alert and oriented to person, place, and time.  Psychiatric: She has a normal mood and affect. Her behavior is normal. Judgment and thought content normal.  Data: I reviewed her ABIs which are 0.9 on the right and 1 on the left.  Her duplex on the right demonstrates 50-74% stenosis in the SFA with also stenosis in the popliteal and monophasic flow below that.     Assessment/Plan:     84 year old female with new onset ulceration of her toes as pictured above.  She has a palpable popliteal pulse on the left where we had intervened above that but not on the right side.  This suggests she has SFA disease similar to the pattern on the left side.  We will begin with angiogram from the  left side and hopeful intervention on the right to improve her blood flow.  We will then possibly need intervention on her toes by Dr. Logan Bores.  She demonstrates good understanding we will get her set up today.  Coumadin will need to be held for 72 hours.     Maeola Harman MD [facility]

## 2017-06-22 NOTE — Telephone Encounter (Signed)
Pt is undergoing abdominal aortogram on 3/27 with request to hold Coumadin for 3 days prior. She takes Coumadin for afib with CHADS2VASc score of 8 (age x2, sex, HTN, CHF, DM, stroke, CAD). Also has history of DVT. Pt will require Lovenox bridge to hold Coumadin for 3 days prior.  Last dose of Coumadin will be 3/23 and pt will need to start Lovenox on Monday morning ideally. However, pt's daughter cannot bring her in before 2pm and pt has never used Lovenox before so she will need to come to clinic for education. This is not ideal however due to last minute notification late on Friday afternoon before procedure, will need to start Lovenox Monday evening instead of morning due to limited patient availability. Will coordinate Lovenox bridge on 3/25 appt.

## 2017-06-24 IMAGING — CR DG CHEST 1V PORT
1 series · 1 of 1 positions shown · non-contrast
Comparison: 03/27/2014

CLINICAL DATA: Cough and fever.  Small bowel obstruction.

EXAM:
PORTABLE CHEST 1 VIEW

[AP]
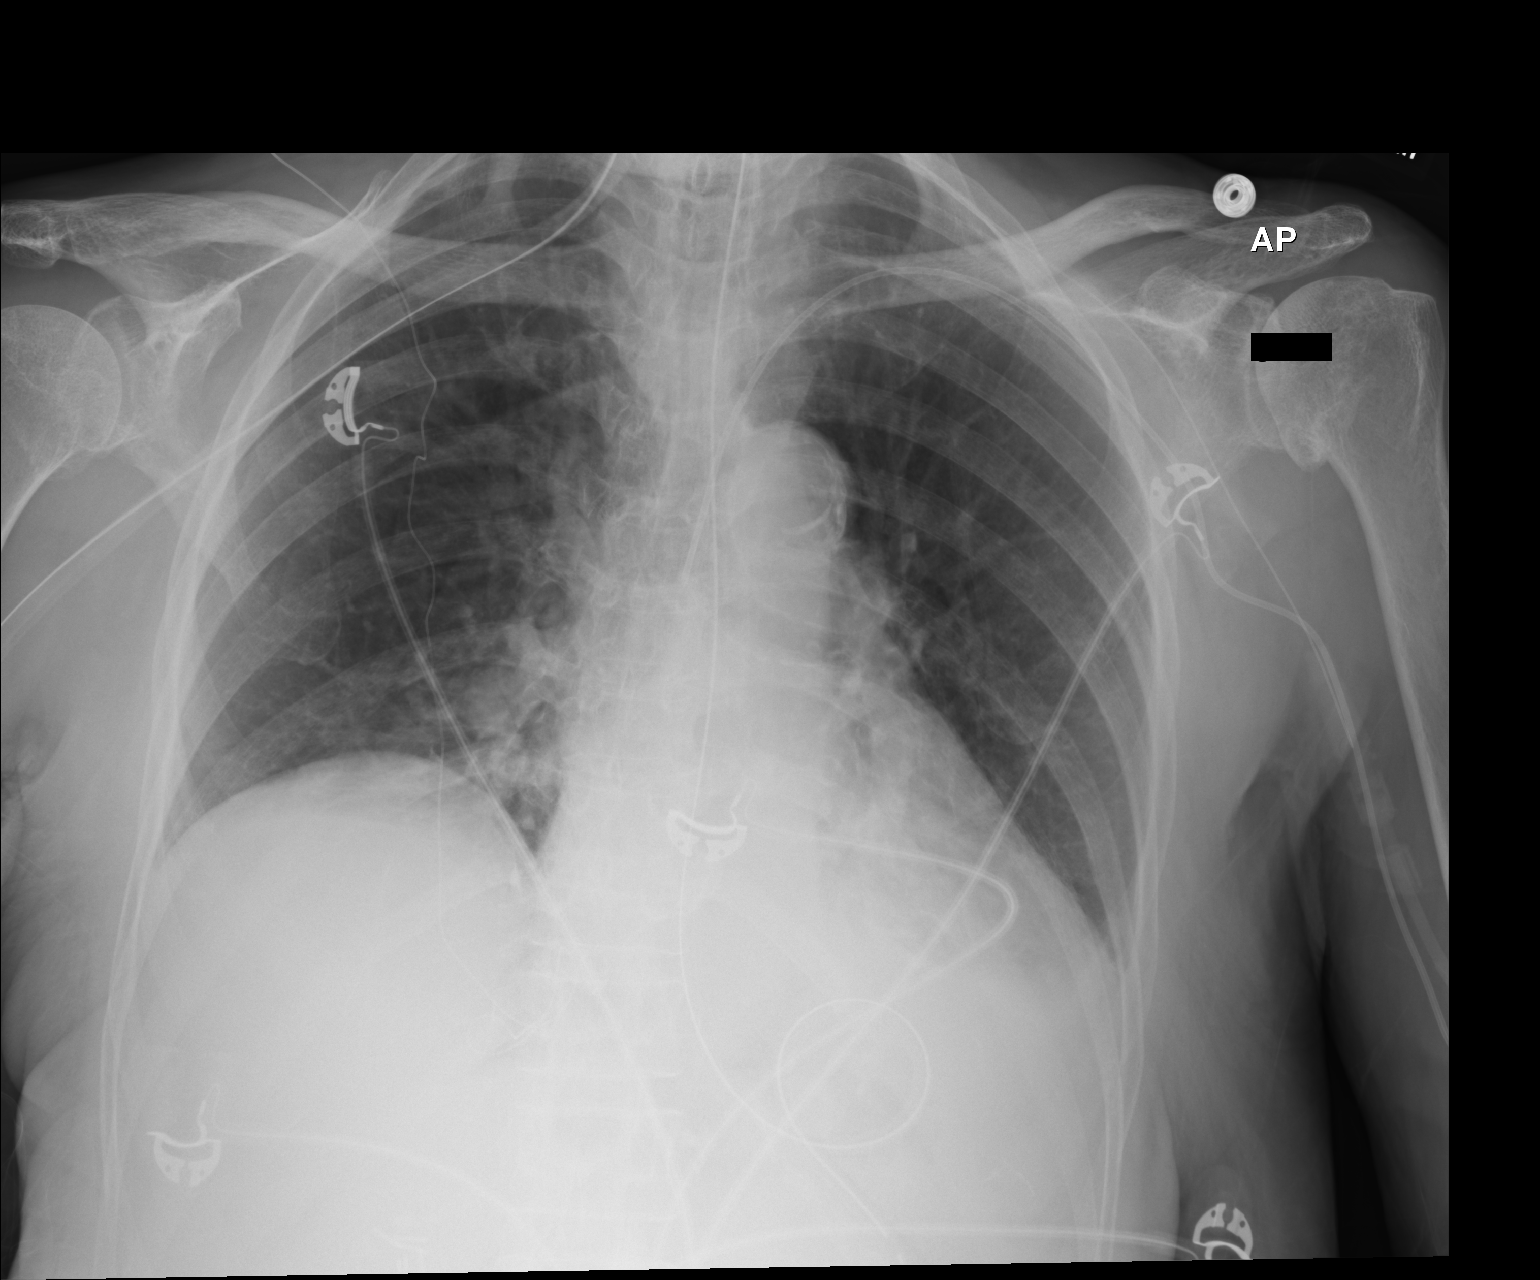

[1 of 1 positions shown; findings below may reference images not displayed]

FINDINGS: A left subclavian central venous catheter is seen with tip overlying
the mid to distal SVC. No evidence of pneumothorax. Nasogastric tube
seen entering the stomach.

New bibasilar pulmonary opacity may be due to atelectasis or
infiltrates. Mild left pleural thickening versus small pleural
effusion remains stable. Cardiomegaly is also stable.
IMPRESSION: Support apparatus in appropriate position. No evidence of
pneumothorax.

New bibasilar infiltrates versus atelectasis.

Stable mild left pleural thickening versus effusion.

Stable cardiomegaly.

## 2017-06-25 ENCOUNTER — Ambulatory Visit: Payer: Medicare Other | Admitting: Podiatry

## 2017-06-25 ENCOUNTER — Encounter: Payer: Self-pay | Admitting: Podiatry

## 2017-06-25 ENCOUNTER — Ambulatory Visit (INDEPENDENT_AMBULATORY_CARE_PROVIDER_SITE_OTHER): Payer: Medicare Other | Admitting: *Deleted

## 2017-06-25 ENCOUNTER — Ambulatory Visit (INDEPENDENT_AMBULATORY_CARE_PROVIDER_SITE_OTHER): Payer: Medicare Other | Admitting: Podiatry

## 2017-06-25 DIAGNOSIS — E0859 Diabetes mellitus due to underlying condition with other circulatory complications: Secondary | ICD-10-CM | POA: Diagnosis not present

## 2017-06-25 DIAGNOSIS — I70235 Atherosclerosis of native arteries of right leg with ulceration of other part of foot: Secondary | ICD-10-CM

## 2017-06-25 DIAGNOSIS — Z5181 Encounter for therapeutic drug level monitoring: Secondary | ICD-10-CM

## 2017-06-25 DIAGNOSIS — I4892 Unspecified atrial flutter: Secondary | ICD-10-CM | POA: Diagnosis not present

## 2017-06-25 DIAGNOSIS — L97512 Non-pressure chronic ulcer of other part of right foot with fat layer exposed: Secondary | ICD-10-CM

## 2017-06-25 LAB — POCT INR: INR: 2.2

## 2017-06-25 MED ORDER — ENOXAPARIN SODIUM 60 MG/0.6ML ~~LOC~~ SOLN: 60.0000 mg | SUBCUTANEOUS | 0 refills | Status: DC

## 2017-06-25 NOTE — Patient Instructions (Addendum)
06/25/17- Continue to hold Coumadin   06/26/17- Continue to hold Coumadin   06/27/17- Day of Procedure, resume warfarin with 5mg  that night after procedure if at home or as directed by doctor.   06/28/17- Continue usual Coumadin  dose. Start Lovenox 60mg  injected under the skin once daily if not admitted to the hospital into fatty abdominal tissue, rotate sites at 10am.   06/29/17- Continue Coumadin normal dose  and lovenox 60mg   Injection at 10am into fatty abdominal tissue, rotate sites  06/30/17- Continue Coumadin normal dose  and lovenox 60mg   Injection at 10am into fatty abdominal tissue, rotate sites  07/01/17- Continue Coumadin normal dose  and lovenox 60mg   Injection at 10am into fatty abdominal tissue, rotate sites  07/02/17- Appointment to have INR (Coumadin) check

## 2017-06-26 NOTE — Progress Notes (Signed)
Assessment and plans reviewed  

## 2017-06-27 ENCOUNTER — Other Ambulatory Visit: Payer: Self-pay

## 2017-06-27 ENCOUNTER — Observation Stay (HOSPITAL_COMMUNITY)
Admission: RE | Admit: 2017-06-27 | Discharge: 2017-06-29 | Disposition: A | Discharge status: Home Health | Payer: Medicare Other | Source: Ambulatory Visit | Attending: Vascular Surgery | Admitting: Vascular Surgery

## 2017-06-27 ENCOUNTER — Encounter (HOSPITAL_COMMUNITY): Payer: Self-pay | Admitting: General Practice

## 2017-06-27 ENCOUNTER — Ambulatory Visit (HOSPITAL_COMMUNITY)
Admission: RE | Disposition: A | Discharge status: Home Health | Payer: Self-pay | Source: Ambulatory Visit | Attending: Vascular Surgery

## 2017-06-27 ENCOUNTER — Ambulatory Visit: Payer: Medicare Other | Admitting: Internal Medicine

## 2017-06-27 DIAGNOSIS — R2681 Unsteadiness on feet: Secondary | ICD-10-CM | POA: Diagnosis not present

## 2017-06-27 DIAGNOSIS — I998 Other disorder of circulatory system: Secondary | ICD-10-CM | POA: Diagnosis not present

## 2017-06-27 DIAGNOSIS — Z881 Allergy status to other antibiotic agents status: Secondary | ICD-10-CM | POA: Diagnosis not present

## 2017-06-27 DIAGNOSIS — Z79899 Other long term (current) drug therapy: Secondary | ICD-10-CM | POA: Diagnosis not present

## 2017-06-27 DIAGNOSIS — Z880 Allergy status to penicillin: Secondary | ICD-10-CM | POA: Insufficient documentation

## 2017-06-27 DIAGNOSIS — I739 Peripheral vascular disease, unspecified: Principal | ICD-10-CM | POA: Diagnosis present

## 2017-06-27 HISTORY — PX: LOWER EXTREMITY ANGIOGRAPHY: CATH118251

## 2017-06-27 HISTORY — PX: PERIPHERAL VASCULAR BALLOON ANGIOPLASTY: CATH118281

## 2017-06-27 HISTORY — DX: Dependence on supplemental oxygen: Z99.81

## 2017-06-27 LAB — POCT I-STAT, CHEM 8
BUN: 75 mg/dL — ABNORMAL HIGH (ref 6–20)
Calcium, Ion: 1.17 mmol/L (ref 1.15–1.40)
Chloride: 101 mmol/L (ref 101–111)
Creatinine, Ser: 1.6 mg/dL — ABNORMAL HIGH (ref 0.44–1.00)
Glucose, Bld: 121 mg/dL — ABNORMAL HIGH (ref 65–99)
HCT: 28 % — ABNORMAL LOW (ref 36.0–46.0)
Hemoglobin: 9.5 g/dL — ABNORMAL LOW (ref 12.0–15.0)
Potassium: 5.8 mmol/L — ABNORMAL HIGH (ref 3.5–5.1)
Sodium: 137 mmol/L (ref 135–145)
TCO2: 31 mmol/L (ref 22–32)

## 2017-06-27 LAB — GLUCOSE, CAPILLARY
Glucose-Capillary: 121 mg/dL — ABNORMAL HIGH (ref 65–99)
Glucose-Capillary: 109 mg/dL — ABNORMAL HIGH (ref 65–99)
Glucose-Capillary: 140 mg/dL — ABNORMAL HIGH (ref 65–99)

## 2017-06-27 LAB — MRSA PCR SCREENING: MRSA by PCR: POSITIVE — AB

## 2017-06-27 LAB — PROTIME-INR
INR: 1.36
Prothrombin Time: 16.7 seconds — ABNORMAL HIGH (ref 11.4–15.2)

## 2017-06-27 SURGERY — LOWER EXTREMITY ANGIOGRAPHY
Anesthesia: LOCAL | Laterality: Right

## 2017-06-27 MED ORDER — WARFARIN SODIUM 2.5 MG PO TABS: 2.5000 mg | ORAL_TABLET | ORAL | Status: DC

## 2017-06-27 MED ORDER — MOMETASONE FURO-FORMOTEROL FUM 200-5 MCG/ACT IN AERO
2.0000 | INHALATION_SPRAY | Freq: Two times a day (BID) | RESPIRATORY_TRACT | Status: DC
  Administered 2017-06-28 (×2): 2 via RESPIRATORY_TRACT
  Filled 2017-06-27: qty 8.8

## 2017-06-27 MED ORDER — IPRATROPIUM-ALBUTEROL 0.5-2.5 (3) MG/3ML IN SOLN: 3.0000 mL | Freq: Four times a day (QID) | RESPIRATORY_TRACT | Status: DC | PRN

## 2017-06-27 MED ORDER — INSULIN ASPART 100 UNIT/ML ~~LOC~~ SOLN: 0.0000 [IU] | Freq: Every day | SUBCUTANEOUS | Status: DC

## 2017-06-27 MED ORDER — CLOPIDOGREL BISULFATE 75 MG PO TABS
75.0000 mg | ORAL_TABLET | Freq: Every day | ORAL | Status: DC
  Administered 2017-06-28 – 2017-06-29 (×2): 75 mg via ORAL
  Filled 2017-06-27 (×2): qty 1

## 2017-06-27 MED ORDER — LEVALBUTEROL TARTRATE 45 MCG/ACT IN AERO: 2.0000 | INHALATION_SPRAY | Freq: Four times a day (QID) | RESPIRATORY_TRACT | Status: DC | PRN

## 2017-06-27 MED ORDER — OXYCODONE HCL 5 MG PO TABS
5.0000 mg | ORAL_TABLET | ORAL | Status: DC | PRN
  Administered 2017-06-28 (×2): 5 mg via ORAL
  Filled 2017-06-27: qty 1

## 2017-06-27 MED ORDER — INSULIN ASPART 100 UNIT/ML ~~LOC~~ SOLN
0.0000 [IU] | Freq: Three times a day (TID) | SUBCUTANEOUS | Status: DC
  Administered 2017-06-28: 1 [IU] via SUBCUTANEOUS
  Administered 2017-06-28: 2 [IU] via SUBCUTANEOUS
  Administered 2017-06-28: 5 [IU] via SUBCUTANEOUS
  Administered 2017-06-29: 2 [IU] via SUBCUTANEOUS
  Administered 2017-06-29: 3 [IU] via SUBCUTANEOUS

## 2017-06-27 MED ORDER — SODIUM CHLORIDE 0.9 % IV SOLN
250.0000 mL | INTRAVENOUS | Status: DC | PRN
  Administered 2017-06-27: 250 mL via INTRAVENOUS

## 2017-06-27 MED ORDER — SERTRALINE HCL 100 MG PO TABS
100.0000 mg | ORAL_TABLET | Freq: Every day | ORAL | Status: DC
  Administered 2017-06-28 – 2017-06-29 (×2): 100 mg via ORAL
  Filled 2017-06-27 (×2): qty 1

## 2017-06-27 MED ORDER — SODIUM CHLORIDE 0.9 % IV SOLN
INTRAVENOUS | Status: AC
  Administered 2017-06-27: 16:00:00 via INTRAVENOUS

## 2017-06-27 MED ORDER — SYSTANE NIGHTTIME OP OINT: 1.0000 "application " | TOPICAL_OINTMENT | Freq: Every day | OPHTHALMIC | Status: DC

## 2017-06-27 MED ORDER — ATORVASTATIN CALCIUM 10 MG PO TABS
10.0000 mg | ORAL_TABLET | Freq: Every day | ORAL | Status: DC
  Administered 2017-06-27 – 2017-06-29 (×3): 10 mg via ORAL
  Filled 2017-06-27 (×3): qty 1

## 2017-06-27 MED ORDER — CARVEDILOL 12.5 MG PO TABS
12.5000 mg | ORAL_TABLET | Freq: Every day | ORAL | Status: DC
  Administered 2017-06-28 – 2017-06-29 (×2): 12.5 mg via ORAL
  Filled 2017-06-27 (×2): qty 1

## 2017-06-27 MED ORDER — FLUTICASONE PROPIONATE 50 MCG/ACT NA SUSP
2.0000 | Freq: Every day | NASAL | Status: DC
  Administered 2017-06-28 – 2017-06-29 (×2): 2 via NASAL
  Filled 2017-06-27: qty 16

## 2017-06-27 MED ORDER — GABAPENTIN 300 MG PO CAPS
600.0000 mg | ORAL_CAPSULE | Freq: Three times a day (TID) | ORAL | Status: DC
  Administered 2017-06-27 – 2017-06-29 (×5): 600 mg via ORAL
  Filled 2017-06-27 (×5): qty 2

## 2017-06-27 MED ORDER — HEPARIN SODIUM (PORCINE) 1000 UNIT/ML IJ SOLN
INTRAMUSCULAR | Status: DC | PRN
  Administered 2017-06-27: 8000 [IU] via INTRAVENOUS

## 2017-06-27 MED ORDER — CALCIUM CARBONATE-VITAMIN D 500-200 MG-UNIT PO TABS
1.0000 | ORAL_TABLET | Freq: Every day | ORAL | Status: DC
  Administered 2017-06-28 – 2017-06-29 (×2): 1 via ORAL
  Filled 2017-06-27 (×2): qty 1

## 2017-06-27 MED ORDER — RESOURCE THICKENUP CLEAR PO POWD
1.0000 | ORAL | Status: DC | PRN
  Filled 2017-06-27: qty 125

## 2017-06-27 MED ORDER — FENTANYL CITRATE (PF) 100 MCG/2ML IJ SOLN
INTRAMUSCULAR | Status: DC | PRN
  Administered 2017-06-27: 25 ug via INTRAVENOUS

## 2017-06-27 MED ORDER — WARFARIN - PHYSICIAN DOSING INPATIENT: Freq: Every day | Status: DC

## 2017-06-27 MED ORDER — LEVOTHYROXINE SODIUM 25 MCG PO TABS
137.0000 ug | ORAL_TABLET | Freq: Every day | ORAL | Status: DC
  Administered 2017-06-28 – 2017-06-29 (×2): 137 ug via ORAL
  Filled 2017-06-27 (×2): qty 1

## 2017-06-27 MED ORDER — HYDRALAZINE HCL 20 MG/ML IJ SOLN: 5.0000 mg | INTRAMUSCULAR | Status: DC | PRN

## 2017-06-27 MED ORDER — CARVEDILOL 6.25 MG PO TABS: 6.2500 mg | ORAL_TABLET | Freq: Two times a day (BID) | ORAL | Status: DC

## 2017-06-27 MED ORDER — SODIUM CHLORIDE 0.9% FLUSH: 3.0000 mL | INTRAVENOUS | Status: DC | PRN

## 2017-06-27 MED ORDER — OSMOLITE 1 CAL PO LIQD: 711.0000 mL | Freq: Every day | ORAL | Status: DC

## 2017-06-27 MED ORDER — ONDANSETRON HCL 4 MG/2ML IJ SOLN
4.0000 mg | Freq: Four times a day (QID) | INTRAMUSCULAR | Status: DC | PRN
  Filled 2017-06-27: qty 2

## 2017-06-27 MED ORDER — B COMPLEX-C PO TABS
1.0000 | ORAL_TABLET | Freq: Every day | ORAL | Status: DC
  Administered 2017-06-27 – 2017-06-29 (×3): 1 via ORAL
  Filled 2017-06-27 (×3): qty 1

## 2017-06-27 MED ORDER — HEPARIN SODIUM (PORCINE) 1000 UNIT/ML IJ SOLN
INTRAMUSCULAR | Status: AC
  Filled 2017-06-27: qty 1

## 2017-06-27 MED ORDER — WARFARIN SODIUM 2.5 MG PO TABS
2.5000 mg | ORAL_TABLET | Freq: Every day | ORAL | Status: DC
  Filled 2017-06-27: qty 1

## 2017-06-27 MED ORDER — SODIUM CHLORIDE 0.9% FLUSH
3.0000 mL | Freq: Two times a day (BID) | INTRAVENOUS | Status: DC
  Administered 2017-06-29: 3 mL via INTRAVENOUS

## 2017-06-27 MED ORDER — PANTOPRAZOLE SODIUM 40 MG PO TBEC
40.0000 mg | DELAYED_RELEASE_TABLET | Freq: Two times a day (BID) | ORAL | Status: DC
  Administered 2017-06-27 – 2017-06-29 (×5): 40 mg via ORAL
  Filled 2017-06-27 (×5): qty 1

## 2017-06-27 MED ORDER — B COMPLEX PO TABS: 1.0000 | ORAL_TABLET | Freq: Every day | ORAL | Status: DC

## 2017-06-27 MED ORDER — SERTRALINE HCL 100 MG PO TABS
100.0000 mg | ORAL_TABLET | Freq: Every day | ORAL | Status: DC
  Administered 2017-06-27: 100 mg
  Filled 2017-06-27: qty 1

## 2017-06-27 MED ORDER — OSMOLITE 1.5 CAL PO LIQD
1000.0000 mL | ORAL | Status: DC
  Administered 2017-06-27 – 2017-06-28 (×2): 1000 mL
  Filled 2017-06-27 (×5): qty 1000

## 2017-06-27 MED ORDER — HEPARIN (PORCINE) IN NACL 2-0.9 UNIT/ML-% IJ SOLN
INTRAMUSCULAR | Status: AC
  Filled 2017-06-27: qty 1000

## 2017-06-27 MED ORDER — CLOPIDOGREL BISULFATE 300 MG PO TABS
ORAL_TABLET | ORAL | Status: DC | PRN
  Administered 2017-06-27: 600 mg via ORAL

## 2017-06-27 MED ORDER — LABETALOL HCL 5 MG/ML IV SOLN
10.0000 mg | INTRAVENOUS | Status: DC | PRN
  Administered 2017-06-27: 10 mg via INTRAVENOUS
  Filled 2017-06-27: qty 4

## 2017-06-27 MED ORDER — ACETAMINOPHEN 325 MG PO TABS: 650.0000 mg | ORAL_TABLET | ORAL | Status: DC | PRN

## 2017-06-27 MED ORDER — FUROSEMIDE 40 MG PO TABS
40.0000 mg | ORAL_TABLET | Freq: Every day | ORAL | Status: DC
  Administered 2017-06-27 – 2017-06-29 (×3): 40 mg via ORAL
  Filled 2017-06-27 (×3): qty 1

## 2017-06-27 MED ORDER — HYDRALAZINE HCL 50 MG PO TABS
50.0000 mg | ORAL_TABLET | Freq: Three times a day (TID) | ORAL | Status: DC
  Administered 2017-06-27 – 2017-06-29 (×6): 50 mg via ORAL
  Filled 2017-06-27 (×6): qty 1

## 2017-06-27 MED ORDER — FREE WATER
60.0000 mL | Freq: Three times a day (TID) | Status: DC
  Administered 2017-06-27 – 2017-06-29 (×6): 60 mL

## 2017-06-27 MED ORDER — POLYETHYL GLYCOL-PROPYL GLYCOL 0.4-0.3 % OP SOLN: 1.0000 [drp] | Freq: Four times a day (QID) | OPHTHALMIC | Status: DC

## 2017-06-27 MED ORDER — OXYCODONE HCL 5 MG PO TABS
5.0000 mg | ORAL_TABLET | ORAL | Status: DC | PRN
  Filled 2017-06-27: qty 1

## 2017-06-27 MED ORDER — LIDOCAINE HCL (PF) 1 % IJ SOLN
INTRAMUSCULAR | Status: DC | PRN
  Administered 2017-06-27: 12 mL

## 2017-06-27 MED ORDER — FENTANYL CITRATE (PF) 100 MCG/2ML IJ SOLN
INTRAMUSCULAR | Status: AC
  Filled 2017-06-27: qty 2

## 2017-06-27 MED ORDER — HEPARIN (PORCINE) IN NACL 2-0.9 UNIT/ML-% IJ SOLN
INTRAMUSCULAR | Status: AC | PRN
  Administered 2017-06-27 (×2): 500 mL

## 2017-06-27 MED ORDER — CARVEDILOL 6.25 MG PO TABS
6.2500 mg | ORAL_TABLET | Freq: Every day | ORAL | Status: DC
Start: 2017-06-27 — End: 2017-06-29
  Administered 2017-06-27 – 2017-06-28 (×2): 6.25 mg via ORAL
  Filled 2017-06-27 (×2): qty 1

## 2017-06-27 MED ORDER — ENOXAPARIN SODIUM 60 MG/0.6ML ~~LOC~~ SOLN
60.0000 mg | SUBCUTANEOUS | Status: DC
  Administered 2017-06-27 – 2017-06-28 (×2): 60 mg via SUBCUTANEOUS
  Filled 2017-06-27 (×2): qty 0.6

## 2017-06-27 MED ORDER — ACETAMINOPHEN 500 MG PO TABS
1000.0000 mg | ORAL_TABLET | Freq: Four times a day (QID) | ORAL | Status: DC | PRN
  Administered 2017-06-28: 1000 mg via ORAL
  Filled 2017-06-27: qty 2

## 2017-06-27 MED ORDER — IODIXANOL 320 MG/ML IV SOLN
INTRAVENOUS | Status: DC | PRN
  Administered 2017-06-27: 115 mL via INTRAVENOUS

## 2017-06-27 MED ORDER — ISOSORBIDE MONONITRATE ER 60 MG PO TB24
60.0000 mg | ORAL_TABLET | Freq: Every day | ORAL | Status: DC
  Administered 2017-06-27 – 2017-06-29 (×3): 60 mg via ORAL
  Filled 2017-06-27 (×3): qty 1

## 2017-06-27 MED ORDER — POLYVINYL ALCOHOL 1.4 % OP SOLN
1.0000 [drp] | Freq: Four times a day (QID) | OPHTHALMIC | Status: DC
  Administered 2017-06-27 – 2017-06-29 (×7): 1 [drp] via OPHTHALMIC
  Filled 2017-06-27: qty 15

## 2017-06-27 MED ORDER — ADULT MULTIVITAMIN W/MINERALS CH
1.0000 | ORAL_TABLET | Freq: Every day | ORAL | Status: DC
  Administered 2017-06-27 – 2017-06-29 (×3): 1 via ORAL
  Filled 2017-06-27 (×3): qty 1

## 2017-06-27 MED ORDER — LATANOPROST 0.005 % OP SOLN
1.0000 [drp] | Freq: Every day | OPHTHALMIC | Status: DC
  Administered 2017-06-27 – 2017-06-28 (×2): 1 [drp] via OPHTHALMIC
  Filled 2017-06-27: qty 2.5

## 2017-06-27 MED ORDER — OXYCODONE HCL 5 MG PO TABS: 2.5000 mg | ORAL_TABLET | ORAL | Status: DC | PRN

## 2017-06-27 MED ORDER — LIDOCAINE HCL 1 % IJ SOLN
INTRAMUSCULAR | Status: AC
  Filled 2017-06-27: qty 20

## 2017-06-27 MED ORDER — WARFARIN SODIUM 5 MG PO TABS
5.0000 mg | ORAL_TABLET | ORAL | Status: DC
  Administered 2017-06-27 – 2017-06-28 (×2): 5 mg via ORAL
  Filled 2017-06-27 (×2): qty 1

## 2017-06-27 MED ORDER — SODIUM CHLORIDE 0.9 % IV SOLN
INTRAVENOUS | Status: DC
  Administered 2017-06-27: 10:00:00 via INTRAVENOUS

## 2017-06-27 SURGICAL SUPPLY — 21 items
BALLN ADMIRAL INPACT 5X200 (BALLOONS) ×3
BALLOON ADMIRAL INPACT 5X200 (BALLOONS) ×2 IMPLANT
CATH MUSTANG 3X40X135 (BALLOONS) ×6 IMPLANT
CATH MUSTANG 4X200X135 (BALLOONS) ×3 IMPLANT
CATH OMNI FLUSH 5F 65CM (CATHETERS) ×3 IMPLANT
CATH QUICKCROSS SUPP .035X90CM (MICROCATHETER) ×3 IMPLANT
COVER PRB 48X5XTLSCP FOLD TPE (BAG) ×2 IMPLANT
COVER PROBE 5X48 (BAG) ×1
DEVICE CLOSURE MYNXGRIP 6/7F (Vascular Products) ×3 IMPLANT
DEVICE TORQUE .025-.038 (MISCELLANEOUS) ×3 IMPLANT
GLIDEWIRE ADV .035X260CM (WIRE) ×3 IMPLANT
KIT ENCORE 26 ADVANTAGE (KITS) ×3 IMPLANT
KIT MICROPUNCTURE NIT STIFF (SHEATH) ×3 IMPLANT
KIT PV (KITS) ×3 IMPLANT
SHEATH AVANTI 11CM 5FR (SHEATH) ×3 IMPLANT
SHEATH AVANTI 11CM 7FR (SHEATH) ×3 IMPLANT
SHEATH FLEXOR ANSEL 1 7F 45CM (SHEATH) ×3 IMPLANT
SYR MEDRAD MARK V 150ML (SYRINGE) ×3 IMPLANT
TRANSDUCER W/STOPCOCK (MISCELLANEOUS) ×3 IMPLANT
TRAY PV CATH (CUSTOM PROCEDURE TRAY) ×3 IMPLANT
WIRE BENTSON .035X145CM (WIRE) ×3 IMPLANT

## 2017-06-27 NOTE — Progress Notes (Signed)
Subjective:  84 year old female presenting today for follow up evaluation of ulcerations of the right foot. She states the wounds do not look as if they are healing. She has been applying betadine to the areas as directed. She has been evaluated by vascular and is scheduled to have a stent placed in two days. Patient is here for further evaluation and treatment.   Past Medical History:  Diagnosis Date  . Acute respiratory failure ([place])   . Anemia    previous blood transfusions  . Arthritis    "all over"  . Asthma   . Bradycardia    requiring previous d/c of BB and reduction of amiodarone  . CAD (coronary artery disease)    nonobstructive per notes  . Chronic diastolic CHF (congestive heart failure) ([place])   . CKD (chronic kidney disease), stage III ([place])   . Complication of blood transfusion    "got the wrong blood type at [facility] in ~ 2015; no adverse reaction that we are aware of"/daughter, [name] (01/27/2016)  . COPD (chronic obstructive pulmonary disease) ([place])   . Depression    "light case"  . DVT (deep venous thrombosis) ([place]) 01/2016   a. LLE DVT 01/2016 - switched from Eliquis to Coumadin.  . Gastric stenosis    a. s/p stomach tube  . GERD (gastroesophageal reflux disease)   . History of blood transfusion    "several" (01/27/2016)  . History of stomach ulcers   . Hyperlipidemia   . Hypertension   . Hypothyroidism   . On home oxygen therapy    "2L; 9PM til 9AM" (06/27/2017)  . Paraesophageal hernia   . Perforated gastric ulcer ([place])   . Pneumonia    "a few times" (06/27/2017)  . Seasonal allergies   . SIADH (syndrome of inappropriate ADH production) ([place])    [name] 01/10/2015  . Small bowel obstruction ([place])    "I don't know how many" (01/11/2015)  . Stroke ([place])    "light one"  . Type II diabetes mellitus ([place])    "related to prednisone use  for > 20 yr; once predinose stopped; no more DM RX" (01/27/2016)  . UTI (urinary tract infection) 02/08/2016  .  Ventral hernia with bowel obstruction       Objective/Physical Exam General: The patient is alert and oriented x3 in no acute distress.  Dermatology:  Wound #1 noted to the right great toe measuring 2.0 x 1.5 x 0.2 cm (LxWxD).  This ulceration appears unchanged  To the noted ulceration(s), there is no eschar. There is a moderate amount of slough, fibrin, and necrotic tissue noted. Granulation tissue and wound base is red. There is a minimal amount of serosanguineous drainage noted. There is no exposed bone muscle-tendon ligament or joint. There is no malodor. Periwound integrity is intact. Skin is warm, dry and supple bilateral lower extremities.  New ulceration noted encompassing the third toe of the right foot.  The toe is cold to touch with maceration encompassing the third digit.  This appears to be localized to the toe only.  No erythema or edema noted.  No malodor noted.  Upon debridement of the superficial skin there is an underlying ulceration compassing the entirety of the third digit right foot.  Vascular: NonPalpable pedal pulses significant in the right lower extremity. No edema or erythema noted.  Extremities are cold to touch  Neurological: Epicritic and protective threshold absent bilaterally.   Musculoskeletal Exam: History of first and second toe amputations left foot  Assessment: #1 ulceration of the right great toe and third digit secondary to diabetes mellitus #2 diabetes mellitus w/ peripheral neuropathy #3 acute ischemic changes third digit right foot #4 acute critical limb ischemia right foot  Plan of Care:  #1 Patient was evaluated. #2 Patient scheduled for stent placement on 06/27/17 by [facility].  #3 Continue applying betadine daily and wearing post op shoe to the right foot.  #4 Medically necessary excisional debridement of the right great toe ulceration including subcutaneous tissue was performed using a tissue nipper and a chisel blade. Excisional debridement of  all the necrotic nonviable tissue down to healthy bleeding viable tissue was performed with post-debridement measurements same as pre-. #5 The wound was cleansed and dry sterile dressing applied. #6 Return to clinic in 3 weeks.    Edrick Kins, DPM [facility]  Dr. Edrick Kins, [address]                                        [place], [place] [address]                Office [phone]  Fax [phone]

## 2017-06-27 NOTE — Op Note (Signed)
Patient name: Shannon [name] MRN: [id number] DOB: [birth year] Sex: female  06/27/2017 Pre-operative Diagnosis: Critical right lower extremity ischemia Post-operative diagnosis:  Same Surgeon:  Apolinar Junes C. Randie Heinz, MD Procedure Performed: 1.  Ultrasound-guided cannulation left common femoral artery. 2.  Right lower extremity angiogram 3.  Drug-coated balloon angioplasty right SFA and popliteal arteries with 5 mm balloon 4.  3 mm plain balloon angioplasty of tibioperoneal trunk on right 5.  Closure of left common femoral artery for continuous access with minx device   Indications: 84 year old female with history of left SFA balloon angioplasty and healing of toes now has similarly affected toes on the right.  She is indicated for angiogram with possible intervention on the right lower extremity.  Findings: The aorta is heavily calcified distally but there is no flow-limiting stenosis we were unable to get a sheath fully over the bifurcation.  The SFA has 60% stenosis extending for approximately 10 cm.  The tibioperoneal trunk has focal 60% stenosis.  At completion there is 0% residual stenosis and no flow-limiting dissection.   Procedure:  The patient was identified in the holding area and taken to room 8.  The patient was then placed supine on the table and prepped and draped in the usual sterile fashion.  A time out was called.  Ultrasound was used to evaluate the left common femoral artery.  It was patent .  A digital ultrasound image was acquired.  A micropuncture needle was used to access the left common femoral artery under ultrasound guidance.  An 018 wire was advanced without resistance and a micropuncture sheath was placed.  The 018 wire was removed and a benson wire was placed.  The micropuncture sheath was exchanged for a 5 french sheath.  An omniflush catheter was inserted we uses to cross the bifurcation with the Bentson wire.  We then attempted to place a long 5 [place] sheath.  Unfortunately  got hung up at the bifurcation and on reviewing the previous images it was noted to be heavily calcified.  We then performed aortogram which demonstrated significant calcification but no flow-limiting stenosis.  We then exchanged for a Glidewire advantage that we were able to get into the SFA and advanced our sheath somewhat into the common iliac artery on the right.  Patient was then heparinized.  We used a quick cross catheter and a Glidewire to get across the stenosed area of the popliteal.  Then performed angiogram demonstrated tibioperoneal trunk tight stenosis.  We first ballooned the SFA and popliteal with a 4 mm balloon.  We then used a 3 mm balloon at the level of the TP trunk.  A drug-coated balloon was then used for the SFA and popliteal 5 mm at nominal pressure for 2-1/2 minutes.  Completion demonstrated near resolution of her stenosis and no flow-limiting dissection.  We then pulled our sheath back exchanged for a short 7 French sheath and a minx device was deployed but appeared to fail and pressure was then held and hemostasis obtained.  Patient had strong signals at the peroneal artery at the level of the ankle as well as PT on the foot.  She tolerated procedure without immediate comp occasion.  Contrast: 115cc   Isidor Bromell C. Randie Heinz, MD [facility] Office: [phone] Pager: [phone]

## 2017-06-27 NOTE — Progress Notes (Addendum)
Patient arrived from cath lab, placed on monitor and vital signs obtained. Pt with dressing to rt foot, pulses doppler.Ltt grion with dressing, level 0.  dressing taken down on foot and dry dressing placed back. Gause placed between toes and skin broken/reddened on top of rt great toe dry dressing placed. Patient with small broken place stage 2 area skin surrounding non blanchable to buttocks and foam applied. Dressing also changed to G/J tube,  Will monitor patient. Nicholas Ossa, Bettina Gavia RN

## 2017-06-27 NOTE — Plan of Care (Signed)
  Problem: Education: Goal: Knowledge of General Education information will improve Outcome: Progressing   Problem: Health Behavior/Discharge Planning: Goal: Ability to manage health-related needs will improve Outcome: Progressing   Problem: Clinical Measurements: Goal: Ability to maintain clinical measurements within normal limits will improve Outcome: Progressing   

## 2017-06-27 NOTE — Progress Notes (Signed)
Per daughter patient gets osmolite 9p-9a Dr. Donzetta Matters made aware and orders received will monitor patient. Shannon [name], Bettina Gavia RN

## 2017-06-27 NOTE — H&P (Signed)
   History and Physical Update  The patient was interviewed and re-examined.  The patient's previous History and Physical has been reviewed and is unchanged from recent office visit. Plan for angiogram with right lower extremity intervention.  Axton Cihlar C. Donzetta Matters, MD [facility] [place] Office: [phone] Pager: [phone]  06/27/2017, 10:13 AM

## 2017-06-28 ENCOUNTER — Encounter (HOSPITAL_COMMUNITY): Payer: Self-pay | Admitting: Vascular Surgery

## 2017-06-28 DIAGNOSIS — Z880 Allergy status to penicillin: Secondary | ICD-10-CM | POA: Diagnosis not present

## 2017-06-28 DIAGNOSIS — Z79899 Other long term (current) drug therapy: Secondary | ICD-10-CM | POA: Diagnosis not present

## 2017-06-28 DIAGNOSIS — R2681 Unsteadiness on feet: Secondary | ICD-10-CM | POA: Diagnosis not present

## 2017-06-28 DIAGNOSIS — Z881 Allergy status to other antibiotic agents status: Secondary | ICD-10-CM | POA: Diagnosis not present

## 2017-06-28 DIAGNOSIS — I739 Peripheral vascular disease, unspecified: Secondary | ICD-10-CM | POA: Diagnosis not present

## 2017-06-28 LAB — GLUCOSE, CAPILLARY
Glucose-Capillary: 266 mg/dL — ABNORMAL HIGH (ref 65–99)
Glucose-Capillary: 161 mg/dL — ABNORMAL HIGH (ref 65–99)
Glucose-Capillary: 139 mg/dL — ABNORMAL HIGH (ref 65–99)
Glucose-Capillary: 139 mg/dL — ABNORMAL HIGH (ref 65–99)

## 2017-06-28 LAB — BASIC METABOLIC PANEL
Anion gap: 9 (ref 5–15)
BUN: 48 mg/dL — ABNORMAL HIGH (ref 6–20)
CO2: 25 mmol/L (ref 22–32)
Calcium: 8.8 mg/dL — ABNORMAL LOW (ref 8.9–10.3)
Chloride: 101 mmol/L (ref 101–111)
Creatinine, Ser: 1.41 mg/dL — ABNORMAL HIGH (ref 0.44–1.00)
GFR calc Af Amer: 36 mL/min — ABNORMAL LOW (ref >60)
GFR calc non Af Amer: 31 mL/min — ABNORMAL LOW (ref >60)
Glucose, Bld: 263 mg/dL — ABNORMAL HIGH (ref 65–99)
Potassium: 4.1 mmol/L (ref 3.5–5.1)
Sodium: 135 mmol/L (ref 135–145)

## 2017-06-28 LAB — PROTIME-INR
INR: 1.41
Prothrombin Time: 17.1 seconds — ABNORMAL HIGH (ref 11.4–15.2)

## 2017-06-28 LAB — CBC
HCT: 23.8 % — ABNORMAL LOW (ref 36.0–46.0)
Hemoglobin: 7.8 g/dL — ABNORMAL LOW (ref 12.0–15.0)
MCH: 28.5 pg (ref 26.0–34.0)
MCHC: 32.8 g/dL (ref 30.0–36.0)
MCV: 86.9 fL (ref 78.0–100.0)
Platelets: 247 10*3/uL (ref 150–400)
RBC: 2.74 MIL/uL — ABNORMAL LOW (ref 3.87–5.11)
RDW: 16.1 % — ABNORMAL HIGH (ref 11.5–15.5)
WBC: 6.9 10*3/uL (ref 4.0–10.5)

## 2017-06-28 MED ORDER — JEVITY 1.2 CAL PO LIQD: 1000.0000 mL | ORAL | Status: DC

## 2017-06-28 MED ORDER — MUPIROCIN 2 % EX OINT
1.0000 "application " | TOPICAL_OINTMENT | Freq: Two times a day (BID) | CUTANEOUS | Status: DC
  Administered 2017-06-28 – 2017-06-29 (×3): 1 via NASAL
  Filled 2017-06-28: qty 22

## 2017-06-28 MED ORDER — CLOPIDOGREL BISULFATE 75 MG PO TABS: 75.0000 mg | ORAL_TABLET | Freq: Every day | ORAL | 0 refills | Status: DC

## 2017-06-28 MED ORDER — CHLORHEXIDINE GLUCONATE CLOTH 2 % EX PADS
6.0000 | MEDICATED_PAD | Freq: Every day | CUTANEOUS | Status: DC
  Administered 2017-06-28: 6 via TOPICAL

## 2017-06-28 MED FILL — Heparin Sodium (Porcine) 2 Unit/ML in Sodium Chloride 0.9%: INTRAMUSCULAR | Qty: 1000 | Status: AC

## 2017-06-28 MED FILL — Lidocaine HCl Local Inj 1%: INTRAMUSCULAR | Qty: 20 | Status: AC

## 2017-06-28 NOTE — Discharge Summary (Addendum)
Physician Discharge Summary   Patient ID: Shannon [name] [id number] 84 y.o. July 20, 1923  Admit date: 06/27/2017  Discharge date and time: 06/29/17  Admitting Physician: Maeola Harman, MD   Discharge Physician: same  Admission Diagnoses: PAD (peripheral artery disease) [facility]) [I73.9]  Discharge Diagnoses: same  Admission Condition: fair  Discharged Condition: fair  Indication for Admission: critical right lower extremity ischemia  Hospital Course: Shannon [name] is a 84y.o. Female who came in as an outpatient for RLE arteriogram with [facility] angioplasty of R SFA and plain balloon angioplasty of tibioperoneal trunk by Dr. Randie Heinz on 06/27/17.  She tolerated the procedure well and was admitted for observation.  POD#1 she is feeling fit for discharge home with daughter.  Patient's nurse ordered PT consult who recommended SNF.  Daughter convinced the patient to stay in hospital another night to work with PT for another session.  POD#2 PT now recommending Home health PT.  Morning drawn labs do not demonstrate any significant changes in renal function.  L groin catheterization site is unremarkable.  She will follow up with Dr. Logan Bores for wound care of R foot.  She will follow up with Dr. Randie Heinz in 4 weeks for R LEAD and ABIs.  She will be started on plavix 75mg  daily for a duration of 90 days.  She will resume her home dose of coumadin today.  Discharge instructions were reviewed with the patient and she voices her understanding.  She will be discharged home this morning with HH PT in stable condition.  Consults: None  Treatments: surgery by Dr. Randie Heinz 06/27/17: 1.  Ultrasound-guided cannulation left common femoral artery. 2.  Right lower extremity angiogram 3.  Drug-coated balloon angioplasty right SFA and popliteal arteries with 5 mm balloon 4.  3 mm plain balloon angioplasty of tibioperoneal trunk on right 5.  Closure of left common femoral artery for continuous access with minx device    Discharge  Exam: see progress note 06/29/17 Vitals:   06/28/17 2105 06/29/17 0439  BP:  (!) 148/44  Pulse: 77 83  Resp: 18 19  Temp:  99 F (37.2 C)  SpO2: 100% 100%     Disposition: Discharge disposition: 01-Home or Self Care       Patient Instructions:  Allergies as of 06/29/2017      Reactions   Penicillins Itching, Rash, Other (See Comments)   Tolerated unasyn, zosyn & cephalosporins in the past.  Has patient had a PCN reaction causing immediate rash, facial/tongue/throat swelling, SOB or lightheadedness with hypotension: Yes Has patient had a PCN reaction causing severe rash involving mucus membranes or skin necrosis: Unk Has patient had a PCN reaction that required hospitalization: Unk Has patient had a PCN reaction occurring within the last 10 years: Unk If all of the above answers are "NO", then may proceed with Cephalosporins   Other    Patient tolerates amoxicillin and cephalosporins      Medication List    TAKE these medications   acetaminophen 500 MG tablet Commonly known as:  TYLENOL Take 1,000 mg by mouth every 6 (six) hours as needed (pain).   AMINO ACIDS-PROTEIN HYDROLYS PO Take 30 mLs by mouth 2 (two) times daily.   atorvastatin 10 MG tablet Commonly known as:  LIPITOR TAKE 1 TABLET BY MOUTH EVERY DAY   b complex vitamins tablet Take 1 tablet by mouth daily.   calcium-vitamin D 500-200 MG-UNIT tablet Commonly known as:  OSCAL WITH D Take 1 tablet by mouth daily with breakfast.   carvedilol  6.25 MG tablet Commonly known as:  COREG TAKE 2 TABS BY MOUTH IN AM AND 1 TAB BY MOUTH IN PM FOR BLOOD PRESSURE   clopidogrel 75 MG tablet Commonly known as:  PLAVIX Take 1 tablet (75 mg total) by mouth daily with breakfast.   collagenase ointment Commonly known as:  SANTYL Apply 1 application topically daily.   cycloSPORINE 0.05 % ophthalmic emulsion Commonly known as:  RESTASIS USE 1 DROP INTO BOTH EYES TWICE DAILY   enoxaparin 60 MG/0.6ML  injection Commonly known as:  LOVENOX Inject 0.6 mLs (60 mg total) into the skin daily.   feeding supplement Liqd Take 711 mLs by mouth at bedtime.   ferrous sulfate 325 (65 FE) MG tablet Take one tablet by mouth once daily with breakfast   fluticasone 50 MCG/ACT nasal spray Commonly known as:  FLONASE Place 2 sprays into both nostrils daily.   free water Soln Place 60 mLs into feeding tube 3 (three) times daily.   furosemide 40 MG tablet Commonly known as:  LASIX Take 1 tablet (40 mg total) by mouth daily.   gabapentin 300 MG capsule Commonly known as:  NEURONTIN TAKE 2 CAPSULES (600 MG TOTAL) BY MOUTH 3 (THREE) TIMES DAILY.   gentamicin cream 0.1 % Commonly known as:  GARAMYCIN Apply 1 application topically 3 (three) times daily.   hydrALAZINE 50 MG tablet Commonly known as:  APRESOLINE Take 1 tablet (50 mg total) by mouth 3 (three) times daily.   insulin aspart 100 UNIT/ML FlexPen Commonly known as:  NOVOLOG FLEXPEN Take three times daily before each meal based on sliding scale 140-199( 2 units), 200-250 (4 units), 251-299 (6 units), 300-349 (8 units), > 350(10 units) What changed:    how much to take  how to take this  when to take this  additional instructions   Insulin Glargine 100 UNIT/ML Solostar Pen Commonly known as:  LANTUS Injection 0.1 ml (10 units) once daily to control blood sugar. Dx E11.9 What changed:    how much to take  how to take this  when to take this  additional instructions   Insulin Pen Needle 31G X 5 MM Misc Commonly known as:  B-D UF III MINI PEN NEEDLES Use twice daily with giving insulin injections. Dx E11.9   ipratropium-albuterol 0.5-2.5 (3) MG/3ML Soln Commonly known as:  DUONEB Take 3 mLs by nebulization every 6 (six) hours as needed (sob). Use 3 times daily x 4 days then every 6 hours as needed.   isosorbide mononitrate 30 MG 24 hr tablet Commonly known as:  IMDUR Take 2 tablets (60 mg total) by mouth daily.    latanoprost 0.005 % ophthalmic solution Commonly known as:  XALATAN Place 1 drop into both eyes at bedtime.   levalbuterol 45 MCG/ACT inhaler Commonly known as:  XOPENEX HFA Inhale 2 puffs into the lungs every 6 (six) hours as needed for wheezing or shortness of breath.   levothyroxine 137 MCG tablet Commonly known as:  SYNTHROID, LEVOTHROID TAKE 1 TABLET (137 MCG TOTAL) BY MOUTH DAILY BEFORE BREAKFAST.   mometasone-formoterol 200-5 MCG/ACT Aero Commonly known as:  DULERA Inhale 2 puffs into the lungs 2 (two) times daily.   multivitamin with minerals Tabs tablet Take 1 tablet by mouth daily.   nystatin powder Commonly known as:  MYCOSTATIN/NYSTOP Apply topically 3 (three) times daily. What changed:    how much to take  when to take this  reasons to take this   oxyCODONE 5 MG immediate release tablet  Commonly known as:  ROXICODONE Take 1 tablet (5 mg total) by mouth every 4 (four) hours as needed for severe pain. May take 1/2 tab q4hrs as needed for mild pain.   OXYGEN Inhale 2 L into the lungs at bedtime.   pantoprazole 40 MG tablet Commonly known as:  PROTONIX Take 1 tablet (40 mg total) by mouth 2 (two) times daily.   Polyethyl Glycol-Propyl Glycol 0.4-0.3 % Soln Place 1 drop into both eyes 4 (four) times daily.   RESOURCE THICKENUP CLEAR Powd Take 120 g by mouth as needed (use to thicken liquids.).   sertraline 100 MG tablet Commonly known as:  ZOLOFT TAKE ONE TABLET BY MOUTH ONCE DAILY FOR DEPRESSION   SYSTANE NIGHTTIME Oint Apply 1 application to eye at bedtime. Both eyes   warfarin 2.5 MG tablet Commonly known as:  COUMADIN Take as directed. If you are unsure how to take this medication, talk to your nurse or doctor. Original instructions:  TAKE AS DIRECTED BY COUMADIN CLINIC. What changed:  See the new instructions.      Activity: activity as tolerated Diet: regular diet Wound Care: follow up with Dr. Logan Bores for wound care  Follow-up with Dr.  Randie Heinz in 4 weeks with RLE duplex and ABIs.  SignedEmilie Rutter 06/29/2017 9:49 AM

## 2017-06-28 NOTE — Discharge Instructions (Signed)
° °  [facility] ° °Discharge Instructions ° °Lower Extremity Angiogram; Angioplasty/Stenting ° °Please refer to the following instructions for your post-procedure care. Your surgeon or physician assistant will discuss any changes with you. ° °Activity ° °Avoid lifting more than 8 pounds (1 gallons of milk) for 72 hours (3 days) after your procedure. You may walk as much as you can tolerate. It's OK to drive after 72 hours. ° °Bathing/Showering ° °You may shower the day after your procedure. If you have a bandage, you may remove it at 24- 48 hours. Clean your incision site with mild soap and water. Pat the area dry with a clean towel. ° °Diet ° °Resume your pre-procedure diet. There are no special food restrictions following this procedure. All patients with peripheral vascular disease should follow a low fat/low cholesterol diet. In order to heal from your surgery, it is CRITICAL to get adequate nutrition. Your body requires vitamins, minerals, and protein. Vegetables are the best source of vitamins and minerals. Vegetables also provide the perfect balance of protein. Processed food has little nutritional value, so try to avoid this. ° °Medications ° °Resume taking all of your medications unless your doctor tells you not to. If your incision is causing pain, you may take over-the-counter pain relievers such as acetaminophen (Tylenol) ° °Follow Up ° °Follow up will be arranged at the time of your procedure. You may have an office visit scheduled or may be scheduled for surgery. Ask your surgeon if you have any questions. ° °Please call us immediately for any of the following conditions: °•Severe or worsening pain your legs or feet at rest or with walking. °•Increased pain, redness, drainage at your groin puncture site. °•Fever of 101 degrees or higher. °•If you have any mild or slow bleeding from your puncture site: lie down, apply firm constant pressure over the area with a piece of  gauze or a clean wash cloth for 30 minutes- no peeking!, call 911 right away if you are still bleeding after 30 minutes, or if the bleeding is heavy and unmanageable. ° °Reduce your risk factors of vascular disease: ° °Stop smoking. If you would like help call [facility] at 1-800-QUIT-NOW (1-800-784-8669) or [place] at [phone]. °Manage your cholesterol °Maintain a desired weight °Control your diabetes °Keep your blood pressure down ° °If you have any questions, please call the office at [phone] ° °

## 2017-06-28 NOTE — Progress Notes (Addendum)
Initial Nutrition Assessment  DOCUMENTATION CODES:   Non-severe (moderate) malnutrition in context of chronic illness  INTERVENTION:    Continue nocturnal TF regimen of Osmolite 1.5 at 65 ml/hr x 12 hrs (9 PM-9 AM)  Provides 1170 kcals, 48 gm protein, 594 ml of free water  NUTRITION DIAGNOSIS:   Moderate Malnutrition related to chronic illness(COPD, CAD, CHF, CKD) as evidenced by moderate fat depletion, moderate muscle depletion   GOAL:   Patient will meet greater than or equal to 90% of their needs  MONITOR:   PO intake, TF tolerance, Labs, Skin, Weight trends, I & O's  REASON FOR ASSESSMENT:   Consult Enteral/tube feeding initiation and management  ASSESSMENT:   84 yo Female s/p RLE angiography and balloon angioplasty. 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   Pt s/p GJ tube change per IR 05/10/17 due to recurrent occlusion.  RD spoke with pt in room. Resting in her recliner. Reports she takes PO's such as jello, yogurt and cream of chicken. Receives supplemental nutrition support via [place] tube nocturnally at home.  Pt unsure of TF formula and regimen. Spoke with pt's daughter by phone. Daughter confirms pt receives Osmolite 1.5 formula at 65 ml/hr from 9 PM to 9 AM. Also states pt's weight has been stable.  Medications include MVI and B-complex with Vit C. Free water flushes at 60 ml 3 times daily. Labs reviewed. CBG's [id number].  NUTRITION - FOCUSED PHYSICAL EXAM:    Most Recent Value  Orbital Region  Moderate depletion  Upper Arm Region  Moderate depletion  Thoracic and Lumbar Region  Unable to assess  Buccal Region  Moderate depletion  Temple Region  Severe depletion  Clavicle Bone Region  Moderate depletion  Clavicle and Acromion Bone Region  Moderate depletion  Scapular Bone Region  Unable to assess  Dorsal Hand  Unable to assess  Patellar Region  Moderate depletion  Anterior Thigh Region  Moderate  depletion  Posterior Calf Region  Moderate depletion  Edema (RD Assessment)  None     Diet Order:  Diet full liquid Room service appropriate? Yes; Fluid consistency: Thin  EDUCATION NEEDS:   No education needs have been identified at this time  Skin:  Skin Assessment: Reviewed RN Assessment  Last BM:  N/A    Intake/Output Summary (Last 24 hours) at 06/28/2017 1302 Last data filed at 06/28/2017 0900 Gross per 24 hour  Intake 1305 ml  Output 1550 ml  Net -245 ml   Height:   Ht Readings from Last 1 Encounters:  06/27/17 5\' 6"  (1.676 m)    Weight:   Wt Readings from Last 1 Encounters:  06/27/17 158 lb (71.7 kg)    Ideal Body Weight:  59 kg  BMI:  Body mass index is 25.5 kg/m.  Estimated Nutritional Needs:   Kcal:  1300-1500  Protein:  60-75 gm  Fluid:  >/= 1.5 L  Arthur Holms, RD, LDN Pager #: [phone] After-Hours Pager #: [phone]

## 2017-06-28 NOTE — Progress Notes (Signed)
  Progress Note    06/28/2017 10:09 AM 1 Day Post-Op  Subjective: No acute issues.  Vitals:   06/27/17 2012 06/28/17 0618  BP: (!) 157/65 (!) 118/57  Pulse: 79 72  Resp: 16 16  Temp: 98.3 F (36.8 C) 98.1 F (36.7 C)  SpO2: 100% 100%    Physical Exam: Awake and alert Nonlabored respirations Abdomen soft Left groin is soft without hematoma Strong right peroneal signal at the level of the ankle and posterior tibial onto the foot Toes have stable gangrenous changes.  CBC    Component Value Date/Time   WBC 6.9 06/28/2017 0324   RBC 2.74 (L) 06/28/2017 0324   HGB 7.8 (L) 06/28/2017 0324   HGB 10.5 (L) 06/27/2016 1054   HCT 23.8 (L) 06/28/2017 0324   HCT 32.0 (L) 06/27/2016 1054   PLT 247 06/28/2017 0324   PLT 368 06/27/2016 1054   MCV 86.9 06/28/2017 0324   MCV 89 06/27/2016 1054   MCH 28.5 06/28/2017 0324   MCHC 32.8 06/28/2017 0324   RDW 16.1 (H) 06/28/2017 0324   RDW 21.1 (H) 06/27/2016 1054   LYMPHSABS 1.3 02/23/2017 0330   LYMPHSABS 1.2 10/06/2015 1151   MONOABS 0.7 02/23/2017 0330   EOSABS 1.0 (H) 02/23/2017 0330   EOSABS 2.4 (H) 10/06/2015 1151   BASOSABS 0.0 02/23/2017 0330   BASOSABS 0.1 10/06/2015 1151    BMET    Component Value Date/Time   NA 135 06/28/2017 0324   NA 133 (A) 06/30/2016   K 4.1 06/28/2017 0324   CL 101 06/28/2017 0324   CO2 25 06/28/2017 0324   GLUCOSE 263 (H) 06/28/2017 0324   BUN 48 (H) 06/28/2017 0324   BUN 59 (A) 06/30/2016   CREATININE 1.41 (H) 06/28/2017 0324   CREATININE 1.10 (H) 10/03/2016 1610   CALCIUM 8.8 (L) 06/28/2017 0324   GFRNONAA 31 (L) 06/28/2017 0324   GFRNONAA 44 (L) 10/03/2016 1610   GFRAA 36 (L) 06/28/2017 0324   GFRAA 51 (L) 10/03/2016 1610    INR    Component Value Date/Time   INR 1.36 06/27/2017 0930     Intake/Output Summary (Last 24 hours) at 06/28/2017 1009 Last data filed at 06/28/2017 0600 Gross per 24 hour  Intake 1065 ml  Output 1200 ml  Net -135 ml     Assessment/plan:  84  y.o. female is s/p drug-coated balloon angioplasty of her right SFA and plain balloon angioplasty of her tibioperoneal trunk on the right.  She denies very strong signals distally to suggest adequate blood flow which will hopefully be enough to heal any toe intervention she needs.  She will follow-up with Dr. Amalia Hailey in a couple weeks to see if her toes demarcate any further.  I will see her in 2-4 weeks with ABIs and right lower extremity duplex.  She will need Plavix for a period of 3 months and we will restart Coumadin.     Shannon [name] C. Donzetta Matters, MD [facility] [place] Office: [phone] Pager: [phone]  06/28/2017 10:09 AM

## 2017-06-28 NOTE — Evaluation (Signed)
Physical Therapy Evaluation Patient Details Name: Shannon [name] MRN: [id number] DOB: 17-May-1923 Today's Date: 06/28/2017   History of Present Illness  Pt is a 84 y/o female S/p RLE angiography and balloon angioplasty. 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   Clinical Impression  Pt s/p surgery above with deficits below. Gait distance limited secondary to pain this session and pt also unsteady and with BLE weakness. Required min to mod A with rollator this session. Feel pt is currently a high fall risk and would benefit from further acute rehab prior to d/c home to maximize safety and independence with mobility. Pt reports she prefers to go home at d/c, however, if pt does not progress with ambulation, may need short term SNF prior to return home. Will continue to follow acutely to maximize functional mobility independence and safety.     Follow Up Recommendations Home health PT;Supervision/Assistance - 24 hour    Equipment Recommendations  Rolling walker with 5" wheels    Recommendations for Other Services OT consult     Precautions / Restrictions Precautions Precautions: Fall Restrictions Weight Bearing Restrictions: No      Mobility  Bed Mobility Overal bed mobility: Needs Assistance Bed Mobility: Supine to Sit     Supine to sit: Min assist     General bed mobility comments: Min A for scooting hips to EOB. Required increased time and elevated HOB and bed rails.   Transfers Overall transfer level: Needs assistance Equipment used: 4-wheeled walker Transfers: Sit to/from Stand Sit to Stand: Mod assist;Min assist         General transfer comment: Sit<>Stand X 2. Upon standing initially, pt reports feeling "swimmy headed", chedked BP and WFL. Pt reports feeling better with seated rest. Required mod to min A for steadying and lift assist to stand. Pt with decreased weightbearing noted on RLE.   Ambulation/Gait Ambulation/Gait  assistance: Min assist Ambulation Distance (Feet): 5 Feet Assistive device: 4-wheeled walker Gait Pattern/deviations: Step-through pattern;Decreased stride length;Trunk flexed Gait velocity: Decreased  Gait velocity interpretation: Below normal speed for age/gender General Gait Details: Slow, unsteady gait. Pt with increased pain, therefore distance limited to chair. Feel pt would be safer with use of RW as pt's breaks do not work on her rollator. Will follow up during next session.   Stairs            Wheelchair Mobility    Modified Rankin (Stroke Patients Only)       Balance Overall balance assessment: Needs assistance Sitting-balance support: No upper extremity supported;Feet supported Sitting balance-Leahy Scale: Fair     Standing balance support: Bilateral upper extremity supported;During functional activity Standing balance-Leahy Scale: Poor Standing balance comment: Reliant on BUE support and external support for steadying.                              Pertinent Vitals/Pain Pain Assessment: Faces Faces Pain Scale: Hurts even more Pain Location: RLE  Pain Descriptors / Indicators: Aching;Operative site guarding Pain Intervention(s): Limited activity within patient's tolerance;Monitored during session;Repositioned    Home Living Family/patient expects to be discharged to:: Private residence Living Arrangements: Children;Other relatives(grandchild ) Available Help at Discharge: Family;Available 24 hours/day Type of Home: House Home Access: Ramped entrance     Home Layout: One level Home Equipment: Walker - 4 wheels;Cane - single point;Hand held Careers information officer - 2 wheels;Bedside commode;Tub bench      Prior  Function Level of Independence: Needs assistance   Gait / Transfers Assistance Needed: Reports she uses rollator for short ambulation distances and did not require assist.   ADL's / Homemaking Assistance Needed: Reports daughter helps  transfer into tub, however, pt is able to bathe herself. Reports she needs assist for IADLs.         Hand Dominance   Dominant Hand: Right    Extremity/Trunk Assessment   Upper Extremity Assessment Upper Extremity Assessment: Defer to OT evaluation    Lower Extremity Assessment Lower Extremity Assessment: RLE deficits/detail;Generalized weakness RLE Deficits / Details: RLE pain secondary to surgery above. Very guarded with RLE during functional tasks.     Cervical / Trunk Assessment Cervical / Trunk Assessment: Kyphotic  Communication   Communication: No difficulties  Cognition Arousal/Alertness: Awake/alert Behavior During Therapy: WFL for tasks assessed/performed Overall Cognitive Status: Within Functional Limits for tasks assessed                                        General Comments General comments (skin integrity, edema, etc.): Discussed short term SNF if pt did not progress, however, pt really wanting to go home.     Exercises     Assessment/Plan    PT Assessment Patient needs continued PT services  PT Problem List Decreased strength;Decreased activity tolerance;Decreased balance;Decreased mobility;Decreased knowledge of use of DME;Decreased knowledge of precautions;Pain       PT Treatment Interventions DME instruction;Gait training;Functional mobility training;Therapeutic activities;Therapeutic exercise;Balance training;Neuromuscular re-education;Patient/family education    PT Goals (Current goals can be found in the Care Plan section)  Acute Rehab PT Goals Patient Stated Goal: to be able to walk like I was  PT Goal Formulation: With patient Time For Goal Achievement: 07/12/17 Potential to Achieve Goals: Good    Frequency Min 3X/week   Barriers to discharge        Co-evaluation               AM-PAC PT "6 Clicks" Daily Activity  Outcome Measure Difficulty turning over in bed (including adjusting bedclothes, sheets and  blankets)?: A Little Difficulty moving from lying on back to sitting on the side of the bed? : Unable Difficulty sitting down on and standing up from a chair with arms (e.g., wheelchair, bedside commode, etc,.)?: Unable Help needed moving to and from a bed to chair (including a wheelchair)?: A Little Help needed walking in hospital room?: A Little Help needed climbing 3-5 steps with a railing? : A Lot 6 Click Score: 13    End of Session Equipment Utilized During Treatment: Gait belt Activity Tolerance: Patient limited by pain Patient left: in chair;with call bell/phone within reach;with chair alarm set Nurse Communication: Mobility status PT Visit Diagnosis: Unsteadiness on feet (R26.81);Pain;Muscle weakness (generalized) (M62.81) Pain - Right/Left: Right Pain - part of body: Leg    Time: 1610-9604 PT Time Calculation (min) (ACUTE ONLY): 34 min   Charges:   PT Evaluation $PT Eval Moderate Complexity: 1 Mod PT Treatments $Therapeutic Activity: 8-22 mins   PT G Codes:        Gladys Damme, PT, DPT  Acute Rehabilitation Services  Pager: [phone]   Lehman Prom 06/28/2017, 11:23 AM

## 2017-06-29 DIAGNOSIS — R2681 Unsteadiness on feet: Secondary | ICD-10-CM | POA: Diagnosis not present

## 2017-06-29 DIAGNOSIS — I739 Peripheral vascular disease, unspecified: Secondary | ICD-10-CM | POA: Diagnosis not present

## 2017-06-29 DIAGNOSIS — Z880 Allergy status to penicillin: Secondary | ICD-10-CM | POA: Diagnosis not present

## 2017-06-29 DIAGNOSIS — Z881 Allergy status to other antibiotic agents status: Secondary | ICD-10-CM | POA: Diagnosis not present

## 2017-06-29 DIAGNOSIS — Z79899 Other long term (current) drug therapy: Secondary | ICD-10-CM | POA: Diagnosis not present

## 2017-06-29 LAB — GLUCOSE, CAPILLARY
Glucose-Capillary: 241 mg/dL — ABNORMAL HIGH (ref 65–99)
Glucose-Capillary: 202 mg/dL — ABNORMAL HIGH (ref 65–99)

## 2017-06-29 LAB — PROTIME-INR
INR: 1.43
Prothrombin Time: 17.3 seconds — ABNORMAL HIGH (ref 11.4–15.2)

## 2017-06-29 IMAGING — CR DG ABDOMEN 1V
1 series · 1 of 1 positions shown · non-contrast
Comparison: Abdominal radiograph performed 01/19/2015

CLINICAL DATA: Acute onset of lower abdominal pain, status post
surgery for small bowel obstruction. Syncope. Initial encounter.

EXAM:
ABDOMEN - 1 VIEW

[abdomen kub]
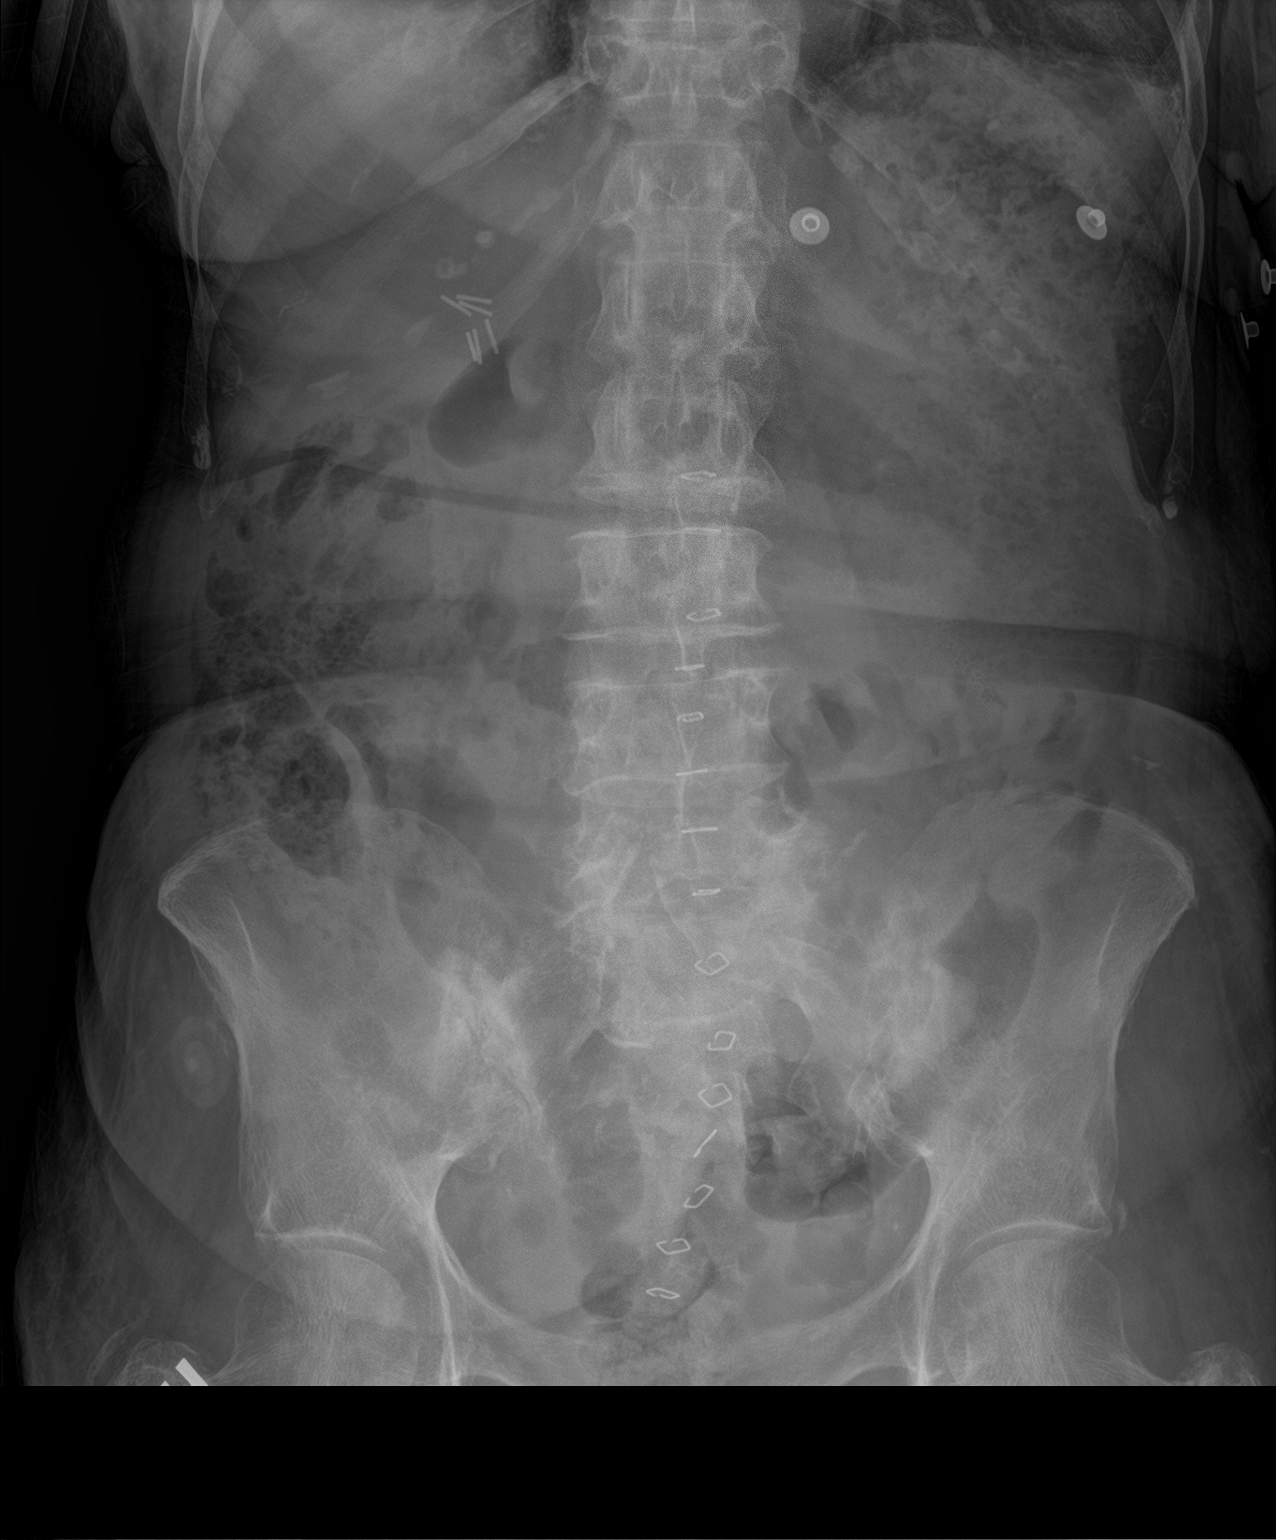

[1 of 1 positions shown; findings below may reference images not displayed]

FINDINGS: Skin staples are noted along the midline abdomen and pelvis. Clips
are noted within the right upper quadrant, reflecting prior
cholecystectomy.

The visualized bowel gas pattern is unremarkable. No small or large
bowel dilatation is seen to suggest obstruction. A moderate amount
of stool is noted in the colon. No free intra-abdominal air is
identified, though evaluation for free air is limited on a single
supine view.

No acute osseous abnormalities are seen. Scattered vascular
calcifications are noted.
IMPRESSION: Unremarkable bowel gas pattern; no free intra-abdominal air seen.
Moderate amount of stool noted in the colon.

## 2017-06-29 NOTE — Progress Notes (Signed)
Physical Therapy Treatment Patient Details Name: Shannon [name] MRN: [id number] DOB: 03-08-1924 Today's Date: 06/29/2017    History of Present Illness Pt is a 84 y/o female S/p RLE angiography and balloon angioplasty. 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     PT Comments    Pt with improved mobility today using 2 wheeled rolling walker rather than her rollator. Pt reports she also has 2 wheeled walker at home. From PT standpoint pt ready to go home with family and HHPT.   Follow Up Recommendations  Home health PT;Supervision for mobility/OOB     Equipment Recommendations  None recommended by PT    Recommendations for Other Services       Precautions / Restrictions Precautions Precautions: Fall Restrictions Weight Bearing Restrictions: No    Mobility  Bed Mobility Overal bed mobility: Modified Independent Bed Mobility: Supine to Sit     Supine to sit: Modified independent (Device/Increase time);HOB elevated        Transfers Overall transfer level: Modified independent Equipment used: 4-wheeled walker;Rolling walker (2 wheeled) Transfers: Sit to/from Stand Sit to Stand: Modified independent (Device/Increase time)         General transfer comment: Slow to rise  Ambulation/Gait Ambulation/Gait assistance: Supervision Ambulation Distance (Feet): 75 Feet Assistive device: Rolling walker (2 wheeled) Gait Pattern/deviations: Step-through pattern;Decreased stride length;Trunk flexed;Decreased stance time - right Gait velocity: Decreased  Gait velocity interpretation: <1.8 ft/sec, indicative of risk for recurrent falls General Gait Details: Steady gait using rolling walker   Stairs            Wheelchair Mobility    Modified Rankin (Stroke Patients Only)       Balance Overall balance assessment: Needs assistance Sitting-balance support: No upper extremity supported;Feet supported Sitting balance-Leahy Scale:  Good     Standing balance support: Bilateral upper extremity supported;During functional activity Standing balance-Leahy Scale: Poor Standing balance comment: walker and distant supervision for static standing                            Cognition Arousal/Alertness: Awake/alert Behavior During Therapy: WFL for tasks assessed/performed Overall Cognitive Status: Within Functional Limits for tasks assessed                                        Exercises      General Comments        Pertinent Vitals/Pain Pain Assessment: Faces Faces Pain Scale: Hurts little more Pain Location: RLE  Pain Descriptors / Indicators: Aching;Operative site guarding Pain Intervention(s): Limited activity within patient's tolerance;Monitored during session    Home Living                      Prior Function            PT Goals (current goals can now be found in the care plan section) Acute Rehab PT Goals PT Goal Formulation: With patient Time For Goal Achievement: 07/05/17 Potential to Achieve Goals: Good Progress towards PT goals: Goals met and updated - see care plan    Frequency    Min 3X/week      PT Plan Current plan remains appropriate    Co-evaluation              AM-PAC PT "6 Clicks" Daily Activity  Outcome Measure  Difficulty turning over in bed (including adjusting bedclothes, sheets and blankets)?: A Little Difficulty moving from lying on back to sitting on the side of the bed? : A Little Difficulty sitting down on and standing up from a chair with arms (e.g., wheelchair, bedside commode, etc,.)?: A Little Help needed moving to and from a bed to chair (including a wheelchair)?: A Little Help needed walking in hospital room?: A Little Help needed climbing 3-5 steps with a railing? : A Lot 6 Click Score: 17    End of Session Equipment Utilized During Treatment: Gait belt Activity Tolerance: Patient tolerated treatment  well Patient left: in chair;with call bell/phone within reach;with chair alarm set Nurse Communication: Mobility status;Other (comment)(purewick out) PT Visit Diagnosis: Pain;Muscle weakness (generalized) (M62.81);Other abnormalities of gait and mobility (R26.89) Pain - Right/Left: Right Pain - part of body: Leg     Time: 7829-5621 PT Time Calculation (min) (ACUTE ONLY): 35 min  Charges:  $Gait Training: 23-37 mins                    G Codes:       [facility] PT [phone]    Angelina Ok [facility] 06/29/2017, 9:43 AM

## 2017-06-29 NOTE — Progress Notes (Signed)
  Progress Note    06/29/2017 7:27 AM 2 Days Post-Op  Subjective:  Patient does not want to go to SNF, she would like to go home today after working with PT.   Vitals:   06/28/17 2105 06/29/17 0439  BP:  (!) 148/44  Pulse: 77 83  Resp: 18 19  Temp:  99 F (37.2 C)  SpO2: 100% 100%   Physical Exam: Lungs:  Non labored Extremities:  RLE 3 vessel run off by doppler; dressing left in place R foot Abdomen:  Soft Neurologic: A&O  CBC    Component Value Date/Time   WBC 6.9 06/28/2017 0324   RBC 2.74 (L) 06/28/2017 0324   HGB 7.8 (L) 06/28/2017 0324   HGB 10.5 (L) 06/27/2016 1054   HCT 23.8 (L) 06/28/2017 0324   HCT 32.0 (L) 06/27/2016 1054   PLT 247 06/28/2017 0324   PLT 368 06/27/2016 1054   MCV 86.9 06/28/2017 0324   MCV 89 06/27/2016 1054   MCH 28.5 06/28/2017 0324   MCHC 32.8 06/28/2017 0324   RDW 16.1 (H) 06/28/2017 0324   RDW 21.1 (H) 06/27/2016 1054   LYMPHSABS 1.3 02/23/2017 0330   LYMPHSABS 1.2 10/06/2015 1151   MONOABS 0.7 02/23/2017 0330   EOSABS 1.0 (H) 02/23/2017 0330   EOSABS 2.4 (H) 10/06/2015 1151   BASOSABS 0.0 02/23/2017 0330   BASOSABS 0.1 10/06/2015 1151    BMET    Component Value Date/Time   NA 135 06/28/2017 0324   NA 133 (A) 06/30/2016   K 4.1 06/28/2017 0324   CL 101 06/28/2017 0324   CO2 25 06/28/2017 0324   GLUCOSE 263 (H) 06/28/2017 0324   BUN 48 (H) 06/28/2017 0324   BUN 59 (A) 06/30/2016   CREATININE 1.41 (H) 06/28/2017 0324   CREATININE 1.10 (H) 10/03/2016 1610   CALCIUM 8.8 (L) 06/28/2017 0324   GFRNONAA 31 (L) 06/28/2017 0324   GFRNONAA 44 (L) 10/03/2016 1610   GFRAA 36 (L) 06/28/2017 0324   GFRAA 51 (L) 10/03/2016 1610    INR    Component Value Date/Time   INR 1.43 06/29/2017 0311     Intake/Output Summary (Last 24 hours) at 06/29/2017 0727 Last data filed at 06/29/2017 0457 Gross per 24 hour  Intake 720 ml  Output 2501 ml  Net -1781 ml     Assessment/Plan:  84 y.o. female is s/p Angioplasty of R  SFA/popliteal/ tibioperoneal trunk 2 Days Post-Op   Continue plavix for 90 days in addition to home coumadin Discharge today after physical therapy Office will call to arrange RLE arterial duplex and ABIs in about 3-4 weeks   Dagoberto Ligas, PA-C [facility] [phone] 06/29/2017 7:27 AM

## 2017-06-29 NOTE — Evaluation (Signed)
Occupational Therapy Evaluation Patient Details Name: Shannon [name] MRN: [id number] DOB: 24-May-1923 Today's Date: 06/29/2017    History of Present Illness Pt is a 84 y/o female S/p RLE angiography and balloon angioplasty. 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    Clinical Impression   Patient evaluated by Occupational Therapy with no further acute OT needs identified. All education has been completed and the patient has no further questions. Pt is able to perform ADLs at min guard to min A level, and daughter able to provide necessary level of assist.  Pt is for discharge home today.   See below for any follow-up Occupational Therapy or equipment needs. OT is signing off. Thank you for this referral.      Follow Up Recommendations  No OT follow up;Supervision/Assistance - 24 hour    Equipment Recommendations  None recommended by OT    Recommendations for Other Services       Precautions / Restrictions Precautions Precautions: Fall Restrictions Weight Bearing Restrictions: No      Mobility Bed Mobility Overal bed mobility: Modified Independent Bed Mobility: Supine to Sit     Supine to sit: Modified independent (Device/Increase time);HOB elevated     General bed mobility comments: up in chair   Transfers Overall transfer level: Needs assistance Equipment used: Rolling walker (2 wheeled) Transfers: Sit to/from [employer] Sit to Stand: Min guard Stand pivot transfers: Min guard       General transfer comment: min guard from toilet, mod I from recliner     Balance Overall balance assessment: Needs assistance Sitting-balance support: No upper extremity supported;Feet supported Sitting balance-Leahy Scale: Good     Standing balance support: Bilateral upper extremity supported;During functional activity Standing balance-Leahy Scale: Fair Standing balance comment: requires UE support for dynamic tasks.   Supervision for static standing                            ADL either performed or assessed with clinical judgement   ADL Overall ADL's : Needs assistance/impaired Eating/Feeding: Independent   Grooming: Wash/dry hands;Wash/dry face;Oral care;Min guard;Standing   Upper Body Bathing: Set up;Sitting   Lower Body Bathing: Min guard;Sit to/from stand   Upper Body Dressing : Set up;Sitting   Lower Body Dressing: Minimal assistance;Sit to/from stand Lower Body Dressing Details (indicate cue type and reason): requires min A to don Rt sock due to bulky dressing  Toilet Transfer: Min guard;Ambulation;Comfort height toilet;Grab bars;RW   Toileting- Architect and Hygiene: Min guard;Sit to/from Nurse, children's Details (indicate cue type and reason): reinforced need to have assisst with shower transfer and discharge instructions will have instruction re: timing of showering  Functional mobility during ADLs: Min guard;Rolling walker General ADL Comments: Pt requires increased time to complete tasks      Vision         Perception     Praxis      Pertinent Vitals/Pain Pain Assessment: Faces Faces Pain Scale: Hurts little more Pain Location: RLE  Pain Descriptors / Indicators: Aching;Operative site guarding Pain Intervention(s): Monitored during session     Hand Dominance Right   Extremity/Trunk Assessment Upper Extremity Assessment Upper Extremity Assessment: Overall WFL for tasks assessed   Lower Extremity Assessment Lower Extremity Assessment: Defer to PT evaluation   Cervical / Trunk Assessment Cervical / Trunk Assessment: Kyphotic   Communication Communication Communication: No difficulties  Cognition Arousal/Alertness: Awake/alert Behavior During Therapy: WFL for tasks assessed/performed Overall Cognitive Status: Within Functional Limits for tasks assessed                                     General Comments   pt reports daughter able to provide necessary assist, and that she is eager to discharge.  VSS     Exercises     Shoulder Instructions      Home Living Family/patient expects to be discharged to:: Private residence Living Arrangements: Children;Other relatives(grandchild ) Available Help at Discharge: Family;Available 24 hours/day Type of Home: House Home Access: Ramped entrance     Home Layout: One level     Bathroom Shower/Tub: Chief Strategy Officer: Standard Bathroom Accessibility: Yes   Home Equipment: Walker - 4 wheels;Cane - single point;Hand held Careers information officer - 2 wheels;Bedside commode;Tub bench          Prior Functioning/Environment Level of Independence: Needs assistance  Gait / Transfers Assistance Needed: Reports she uses rollator for short ambulation distances and did not require assist.  ADL's / Homemaking Assistance Needed: Reports daughter helps transfer into tub, however, pt is able to bathe herself. Reports she needs assist for IADLs.             OT Problem List: Decreased strength;Decreased activity tolerance;Impaired balance (sitting and/or standing);Pain      OT Treatment/Interventions:      OT Goals(Current goals can be found in the care plan section) Acute Rehab OT Goals Patient Stated Goal: to go home  OT Goal Formulation: All assessment and education complete, DC therapy  OT Frequency:     Barriers to D/C:            Co-evaluation              AM-PAC PT "6 Clicks" Daily Activity     Outcome Measure Help from another person eating meals?: None Help from another person taking care of personal grooming?: A Little Help from another person toileting, which includes using toliet, bedpan, or urinal?: A Little Help from another person bathing (including washing, rinsing, drying)?: A Little Help from another person to put on and taking off regular upper body clothing?: A Little Help from another person to put on and  taking off regular lower body clothing?: A Little 6 Click Score: 19   End of Session Equipment Utilized During Treatment: Rolling walker Nurse Communication: Mobility status  Activity Tolerance: Patient tolerated treatment well Patient left: in chair;with call bell/phone within reach;with chair alarm set;with family/visitor present  OT Visit Diagnosis: Unsteadiness on feet (R26.81);Pain Pain - Right/Left: Right Pain - part of body: Leg                Time: 4098-1191 OT Time Calculation (min): 25 min Charges:  OT General Charges $OT Visit: 1 Visit OT Evaluation $OT Eval Moderate Complexity: 1 Mod OT Treatments $Self Care/Home Management : 8-22 mins G-Codes:     [employer], OTR/L [phone]   Jeani Hawking M 06/29/2017, 11:52 AM

## 2017-06-29 NOTE — Care Management Note (Signed)
Case Management Note Marvetta Gibbons RN, BSN Unit 4E-Case Manager [phone]  Patient Details  Name: Troy Hartzog MRN: [id number] Date of Birth: 1923-12-19  Subjective/Objective:   Pt presesnted s/p RLE arteriogram with [facility] angioplasty of R SFA and plain balloon angioplasty of tibioperoneal trunk                 Action/Plan: PTA pt lived at home with daughter- has RW and rollator at home- per PT eval HH services recommended- order placed for HHPT- spoke with pt at bedside - pt agreeable to services and states she has used [facility] in the past- would like to use them again- call made to [facility] with [facility] for HHPT referral.- referral has been accepted.    Expected Discharge Date:  06/29/17               Expected Discharge Plan:   Home with Home health  In-House Referral:   NA  Discharge planning Services   CM consult  Post Acute Care Choice:   Home Health Choice offered to:   Patient  DME Arranged:   NA DME Agency:    NA  HH Arranged:   PT [place] Agency:   [facility]  Status of Service:   Completed  If discussed at [place] of Stay Meetings, dates discussed:    Discharge Disposition: home/home health   Additional Comments:  Dawayne Patricia, RN 06/29/2017, 10:51 AM

## 2017-06-29 NOTE — Progress Notes (Signed)
Discharge home today at 1238pm.  Discharge summary and medications reviewed and summary signed by patient.  Belongings gathered and packed.  Assisted by staff to son's car without difficulty.

## 2017-07-02 ENCOUNTER — Ambulatory Visit (INDEPENDENT_AMBULATORY_CARE_PROVIDER_SITE_OTHER): Payer: Medicare Other | Admitting: Pharmacist

## 2017-07-02 ENCOUNTER — Telehealth: Payer: Self-pay

## 2017-07-02 ENCOUNTER — Telehealth: Payer: Self-pay | Admitting: Vascular Surgery

## 2017-07-02 DIAGNOSIS — Z5181 Encounter for therapeutic drug level monitoring: Secondary | ICD-10-CM

## 2017-07-02 DIAGNOSIS — I4892 Unspecified atrial flutter: Secondary | ICD-10-CM | POA: Diagnosis not present

## 2017-07-02 LAB — POCT INR: INR: 1.5

## 2017-07-02 NOTE — Telephone Encounter (Signed)
Spoke to daughter for appt dates and time of 4/26 x2 [place] and OV starting at 3 pm  Mailed letter

## 2017-07-02 NOTE — Telephone Encounter (Signed)
-----   Message from Mena Goes, RN sent at 06/28/2017 10:46 AM EDT ----- Regarding: 4 weeks w/ 2 labs, postop    ----- Message ----- From: Iline Oven Sent: 06/28/2017   7:40 AM To: [facility] Charge Pool  Can you schedule an appt for this pt in about 4 weeks with RLE arterial duplex and ABIs for Dr. Donzetta Matters.  PO R SFA, popliteal angioplasty. Thanks, [facility]

## 2017-07-02 NOTE — Telephone Encounter (Signed)
Transition Care Management Follow-Up Telephone Call   Date discharged and where: [facility] on 06/29/2017  How have you been since you were released from the hospital? Feels sore in groin from catheterization  Any patient concerns? None  Items Reviewed:   Meds: Y  Allergies: Y  Dietary Changes Reviewed: Y  Functional Questionnaire:  Independent-I Dependent-D  ADLs:   Dressing- I    Eating-I   Maintaining continence- I   Transferring- I w/ walker, cane or wheelchair   Transportation-D   Meal Prep-D   Managing Meds- I w/ assist  Confirmed importance and Date/Time of follow-up visits scheduled: Yes, Sherrie Mustache NP 07/03/2017 @ 2:45pm   Confirmed with patient if condition worsens to call PCP or go to the Emergency Dept. Patient was given office number and encouraged to call back with questions or concerns: Yes

## 2017-07-02 NOTE — Telephone Encounter (Signed)
I have made the 1st attempt to contact the patient or family member in charge, in order to follow up from recently being discharged from the hospital. I left a message on voicemail but I will make another attempt at a different time.  

## 2017-07-02 NOTE — Patient Instructions (Signed)
Description   Take 10mg  today and 7.5mg  tomorrow, then continue normal dose of 5mg  daily except 2.5mg  on Fridays. Continue Lovenox injections once daily until INR recheck on Friday.

## 2017-07-03 ENCOUNTER — Ambulatory Visit (INDEPENDENT_AMBULATORY_CARE_PROVIDER_SITE_OTHER): Payer: Medicare Other | Admitting: Nurse Practitioner

## 2017-07-03 ENCOUNTER — Encounter: Payer: Self-pay | Admitting: Nurse Practitioner

## 2017-07-03 ENCOUNTER — Telehealth: Payer: Self-pay | Admitting: *Deleted

## 2017-07-03 VITALS — BP 128/62 | HR 74 | Temp 98.3°F | Ht 65.0 in | Wt 161.0 lb

## 2017-07-03 DIAGNOSIS — E1122 Type 2 diabetes mellitus with diabetic chronic kidney disease: Secondary | ICD-10-CM | POA: Diagnosis not present

## 2017-07-03 DIAGNOSIS — N189 Chronic kidney disease, unspecified: Secondary | ICD-10-CM | POA: Diagnosis not present

## 2017-07-03 DIAGNOSIS — R531 Weakness: Secondary | ICD-10-CM

## 2017-07-03 DIAGNOSIS — N183 Chronic kidney disease, stage 3 unspecified: Secondary | ICD-10-CM

## 2017-07-03 DIAGNOSIS — I130 Hypertensive heart and chronic kidney disease with heart failure and stage 1 through stage 4 chronic kidney disease, or unspecified chronic kidney disease: Secondary | ICD-10-CM | POA: Diagnosis not present

## 2017-07-03 DIAGNOSIS — Z794 Long term (current) use of insulin: Secondary | ICD-10-CM | POA: Diagnosis not present

## 2017-07-03 DIAGNOSIS — I2510 Atherosclerotic heart disease of native coronary artery without angina pectoris: Secondary | ICD-10-CM | POA: Diagnosis not present

## 2017-07-03 DIAGNOSIS — E11621 Type 2 diabetes mellitus with foot ulcer: Secondary | ICD-10-CM

## 2017-07-03 DIAGNOSIS — I70209 Unspecified atherosclerosis of native arteries of extremities, unspecified extremity: Secondary | ICD-10-CM | POA: Diagnosis not present

## 2017-07-03 DIAGNOSIS — D638 Anemia in other chronic diseases classified elsewhere: Secondary | ICD-10-CM | POA: Diagnosis not present

## 2017-07-03 DIAGNOSIS — E785 Hyperlipidemia, unspecified: Secondary | ICD-10-CM | POA: Diagnosis not present

## 2017-07-03 DIAGNOSIS — I5030 Unspecified diastolic (congestive) heart failure: Secondary | ICD-10-CM | POA: Diagnosis not present

## 2017-07-03 DIAGNOSIS — E1151 Type 2 diabetes mellitus with diabetic peripheral angiopathy without gangrene: Secondary | ICD-10-CM | POA: Diagnosis not present

## 2017-07-03 DIAGNOSIS — J449 Chronic obstructive pulmonary disease, unspecified: Secondary | ICD-10-CM | POA: Diagnosis not present

## 2017-07-03 DIAGNOSIS — Z7951 Long term (current) use of inhaled steroids: Secondary | ICD-10-CM | POA: Diagnosis not present

## 2017-07-03 DIAGNOSIS — Z95820 Peripheral vascular angioplasty status with implants and grafts: Secondary | ICD-10-CM | POA: Diagnosis not present

## 2017-07-03 DIAGNOSIS — I739 Peripheral vascular disease, unspecified: Secondary | ICD-10-CM | POA: Diagnosis not present

## 2017-07-03 DIAGNOSIS — M1711 Unilateral primary osteoarthritis, right knee: Secondary | ICD-10-CM | POA: Diagnosis not present

## 2017-07-03 DIAGNOSIS — E038 Other specified hypothyroidism: Secondary | ICD-10-CM | POA: Diagnosis not present

## 2017-07-03 DIAGNOSIS — I4819 Other persistent atrial fibrillation: Secondary | ICD-10-CM

## 2017-07-03 DIAGNOSIS — K219 Gastro-esophageal reflux disease without esophagitis: Secondary | ICD-10-CM | POA: Diagnosis not present

## 2017-07-03 DIAGNOSIS — I481 Persistent atrial fibrillation: Secondary | ICD-10-CM | POA: Diagnosis not present

## 2017-07-03 DIAGNOSIS — L97513 Non-pressure chronic ulcer of other part of right foot with necrosis of muscle: Secondary | ICD-10-CM | POA: Diagnosis not present

## 2017-07-03 DIAGNOSIS — Z86718 Personal history of other venous thrombosis and embolism: Secondary | ICD-10-CM | POA: Diagnosis not present

## 2017-07-03 DIAGNOSIS — Z89422 Acquired absence of other left toe(s): Secondary | ICD-10-CM | POA: Diagnosis not present

## 2017-07-03 DIAGNOSIS — Z48812 Encounter for surgical aftercare following surgery on the circulatory system: Secondary | ICD-10-CM | POA: Diagnosis not present

## 2017-07-03 NOTE — Telephone Encounter (Signed)
Shannon [name] with [facility] called requesting verbal orders for PT 2x4wks and 1x2wks. Patient has an appointment for follow up today with Janett Billow. Verbal orders given.

## 2017-07-03 NOTE — Patient Instructions (Addendum)
Follow up in 1 month with Fasting labs prior to office visit   Can try tylenol 500 mg 2 tablets =1000 mg by mouth every 8 hours as needed for knee pain To use muscle rub as needed on knee

## 2017-07-03 NOTE — Progress Notes (Signed)
Careteam: Patient Care Team: Gildardo Cranker, DO as PCP - General (Internal Medicine) Edrick Kins, DPM as Consulting Physician (Podiatry) Dorothy Spark, MD as Consulting Physician (Cardiology)  Advanced Directive information    Allergies  Allergen Reactions  . Penicillins Itching, Rash and Other (See Comments)    Tolerated unasyn, zosyn & cephalosporins in the past.  Has patient had a PCN reaction causing immediate rash, facial/tongue/throat swelling, SOB or lightheadedness with hypotension: Yes Has patient had a PCN reaction causing severe rash involving mucus membranes or skin necrosis: Unk Has patient had a PCN reaction that required hospitalization: Unk Has patient had a PCN reaction occurring within the last 10 years: Unk If all of the above answers are "NO", then may proceed with Cephalosporins  . Other     Patient tolerates amoxicillin and cephalosporins    Chief Complaint  Patient presents with  . Transitions Of Care    Pt was recently discharged from [facility] 3/27 to 3/29 due to PAD.      HPI: Patient is a 84 y.o. female seen in the office today for follow up hospitalization. Pt came in as an outpatient for RLE arteriogram with [facility] angioplasty of R SFA and plain balloon angioplasty of tibioperoneal trunk by Dr. Donzetta Matters on 06/27/17 was admitted for observation. She stayed an additional night to work with PT and home health was recommended.  Renal function remained stable and groin site was unremarkable. She is following up with Dr Ellard Artis for wound care of right food and Dr Donzetta Matters in 4 weekss for R LEAD and ABIs.  She was started on plavix 75 mg daily for 90 days and to resume home coumadin. Pt was placed on lovenox to bridge. She is followed by coumadin clinic.  Has already followed up with Dr Amalia Hailey. Daughter feels like foot already showing improvement.   originally made appt to follow up ED due to chest pain. Daughter had her only take 2 pills at a time (not all at once)  and this helped symptoms  Pt also with GERD. Reports she took 4 tablets and had an episode of belching and reflux.   Review of Systems:  Review of Systems  Constitutional: Negative for chills, fever, malaise/fatigue and weight loss.  Respiratory: Negative for cough, sputum production and shortness of breath.   Cardiovascular: Positive for leg swelling (minimal). Negative for chest pain and palpitations.  Gastrointestinal: Positive for constipation (improved) and heartburn. Negative for abdominal pain and diarrhea.  Genitourinary: Negative for dysuria, frequency and urgency.  Musculoskeletal: Positive for joint pain and neck pain. Negative for back pain, falls and myalgias.  Skin: Negative.   Neurological: Negative for dizziness and headaches.  Psychiatric/Behavioral: Negative for depression and memory loss. The patient does not have insomnia.     Past Medical History:  Diagnosis Date  . Acute respiratory failure ([place])   . Anemia    previous blood transfusions  . Arthritis    "all over"  . Asthma   . Bradycardia    requiring previous d/c of BB and reduction of amiodarone  . CAD (coronary artery disease)    nonobstructive per notes  . Chronic diastolic CHF (congestive heart failure) ([place])   . CKD (chronic kidney disease), stage III ([place])   . Complication of blood transfusion    "got the wrong blood type at [facility] in ~ 2015; no adverse reaction that we are aware of"/daughter, [name] (01/27/2016)  . COPD (chronic obstructive pulmonary disease) ([place])   .  Depression    "light case"  . DVT (deep venous thrombosis) ([place]) 01/2016   a. LLE DVT 01/2016 - switched from Eliquis to Coumadin.  . Gastric stenosis    a. s/p stomach tube  . GERD (gastroesophageal reflux disease)   . History of blood transfusion    "several" (01/27/2016)  . History of stomach ulcers   . Hyperlipidemia   . Hypertension   . Hypothyroidism   . On home oxygen therapy    "2L; 9PM til 9AM" (06/27/2017)  .  Paraesophageal hernia   . Perforated gastric ulcer ([place])   . Pneumonia    "a few times" (06/27/2017)  . Seasonal allergies   . SIADH (syndrome of inappropriate ADH production) ([place])    [name] 01/10/2015  . Small bowel obstruction ([place])    "I don't know how many" (01/11/2015)  . Stroke ([place])    "light one"  . Type II diabetes mellitus ([place])    "related to prednisone use  for > 20 yr; once predinose stopped; no more DM RX" (01/27/2016)  . UTI (urinary tract infection) 02/08/2016  . Ventral hernia with bowel obstruction    Past Surgical History:  Procedure Laterality Date  . ABDOMINAL AORTOGRAM N/A 09/21/2016   Procedure: Abdominal Aortogram;  Surgeon: Waynetta Sandy, MD;  Location: [place] [facility];  Service: Cardiovascular;  Laterality: N/A;  . AMPUTATION Left 09/25/2016   Procedure: AMPUTATION DIGIT- LEFT 2ND AND 3RD TOES;  Surgeon: Rosetta Posner, MD;  Location: [place];  Service: Vascular;  Laterality: Left;  . CATARACT EXTRACTION W/ INTRAOCULAR LENS  IMPLANT, BILATERAL    . CHOLECYSTECTOMY OPEN    . COLECTOMY    . ESOPHAGOGASTRODUODENOSCOPY N/A 01/19/2014   Procedure: ESOPHAGOGASTRODUODENOSCOPY (EGD);  Surgeon: Irene Shipper, MD;  Location: [facility];  Service: Endoscopy;  Laterality: N/A;  . ESOPHAGOGASTRODUODENOSCOPY N/A 01/20/2014   Procedure: ESOPHAGOGASTRODUODENOSCOPY (EGD);  Surgeon: Irene Shipper, MD;  Location: [facility];  Service: Endoscopy;  Laterality: N/A;  . ESOPHAGOGASTRODUODENOSCOPY N/A 03/19/2014   Procedure: ESOPHAGOGASTRODUODENOSCOPY (EGD);  Surgeon: Milus Banister, MD;  Location: [facility];  Service: Endoscopy;  Laterality: N/A;  . ESOPHAGOGASTRODUODENOSCOPY N/A 07/08/2015   Procedure: ESOPHAGOGASTRODUODENOSCOPY (EGD);  Surgeon: Doran Stabler, MD;  Location: [facility] ENDOSCOPY;  Service: Endoscopy;  Laterality: N/A;  . ESOPHAGOGASTRODUODENOSCOPY (EGD) WITH PROPOFOL N/A 09/15/2015   Procedure: ESOPHAGOGASTRODUODENOSCOPY (EGD) WITH PROPOFOL;  Surgeon:  Manus Gunning, MD;  [facility]: [facility];  Service: Gastroenterology;  Laterality: N/A;  . GASTROJEJUNOSTOMY     hx/notes 01/10/2015  . GASTROJEJUNOSTOMY N/A 09/23/2015   Procedure: OPEN GASTROJEJUNOSTOMY TUBE PLACEMENT;  Surgeon: Arta Bruce Kinsinger, MD;  Location: [facility];  Service: General;  Laterality: N/A;  . GLAUCOMA SURGERY Bilateral   . HERNIA REPAIR  2015  . IR CM INJ ANY COLONIC TUBE W/FLUORO  01/05/2017  . IR CM INJ ANY COLONIC TUBE W/FLUORO  06/07/2017  . IR GENERIC HISTORICAL  01/07/2016   IR GJ TUBE CHANGE 01/07/2016 Jacqulynn Cadet, MD [facility]  . IR GENERIC HISTORICAL  01/27/2016   IR MECH REMOV OBSTRUC MAT ANY COLON TUBE W/FLUORO 01/27/2016 Markus Daft, MD [facility]  . IR GENERIC HISTORICAL  02/07/2016   IR PATIENT EVAL TECH 0-60 MINS Darrell K Allred, PA-C [facility]  . IR GENERIC HISTORICAL  02/08/2016   IR GJ TUBE CHANGE 02/08/2016 Greggory Keen, MD [facility]  . IR GENERIC HISTORICAL  01/06/2016   IR GJ TUBE CHANGE 01/06/2016 [facility] OUT REF  . IR GENERIC  HISTORICAL  05/02/2016   IR CM INJ ANY COLONIC TUBE W/FLUORO 05/02/2016 Arne Cleveland, MD [facility]  . IR GENERIC HISTORICAL  05/15/2016   IR GJ TUBE CHANGE 05/15/2016 Sandi Mariscal, MD [facility]  . IR GENERIC HISTORICAL  06/28/2016   IR GJ TUBE CHANGE 06/28/2016 [facility]  . IR GJ TUBE CHANGE  02/20/2017  . IR GJ TUBE CHANGE  05/10/2017  . IR PATIENT EVAL TECH 0-60 MINS  10/19/2016  . IR PATIENT EVAL TECH 0-60 MINS  12/25/2016  . IR PATIENT EVAL TECH 0-60 MINS  01/29/2017  . IR PATIENT EVAL TECH 0-60 MINS  04/04/2017  . IR PATIENT EVAL TECH 0-60 MINS  04/30/2017  . IR REPLC DUODEN/JEJUNO TUBE PERCUT W/FLUORO  11/14/2016  . LAPAROTOMY N/A 01/20/2015   Procedure: EXPLORATORY LAPAROTOMY;  Surgeon: Coralie Keens, MD;  Location: [place];  Service: General;  Laterality: N/A;  . LOWER EXTREMITY ANGIOGRAPHY Left 09/21/2016   Procedure: Lower Extremity Angiography;  Surgeon: Waynetta Sandy, MD;   Location: [place] [facility];  Service: Cardiovascular;  Laterality: Left;  . LOWER EXTREMITY ANGIOGRAPHY Right 06/27/2017   Procedure: Lower Extremity Angiography;  Surgeon: Waynetta Sandy, MD;  Location: [place] [facility];  Service: Cardiovascular;  Laterality: Right;  . LYSIS OF ADHESION N/A 01/20/2015   Procedure: LYSIS OF ADHESIONS < 1 HOUR;  Surgeon: Coralie Keens, MD;  Location: [place];  Service: General;  Laterality: N/A;  . PERIPHERAL VASCULAR BALLOON ANGIOPLASTY Left 09/21/2016   Procedure: Peripheral Vascular Balloon Angioplasty;  Surgeon: Waynetta Sandy, MD;  Location: [place] [facility];  Service: Cardiovascular;  Laterality: Left;  drug coated balloon  . PERIPHERAL VASCULAR BALLOON ANGIOPLASTY Right 06/27/2017   Procedure: PERIPHERAL VASCULAR BALLOON ANGIOPLASTY;  Surgeon: Waynetta Sandy, MD;  Location: [place] [facility];  Service: Cardiovascular;  Laterality: Right;  SFA/TPTRUNK  . TONSILLECTOMY    . TUBAL LIGATION    . VENTRAL HERNIA REPAIR  2015   incarcerated ventral hernia ([facility] 09/2013)/notes 01/10/2015   Social History:   reports that she has never smoked. She has quit using smokeless tobacco. Her smokeless tobacco use included snuff. She reports that she does not drink alcohol or use drugs.  Family History  Problem Relation Age of Onset  . Stroke Mother   . Hypertension Mother   . Diabetes Brother   . Glaucoma Son   . Glaucoma Son   . Heart attack Neg Hx   . Colon cancer Neg Hx   . Stomach cancer Neg Hx     Medications: Patient's Medications  New Prescriptions   No medications on file  Previous Medications   ACETAMINOPHEN (TYLENOL) 500 MG TABLET    Take 1,000 mg by mouth every 6 (six) hours as needed (pain).   AMINO ACIDS-PROTEIN HYDROLYS PO    Take 30 mLs by mouth 2 (two) times daily.   ATORVASTATIN (LIPITOR) 10 MG TABLET    TAKE 1 TABLET BY MOUTH EVERY DAY   B COMPLEX VITAMINS TABLET    Take 1 tablet by mouth daily.     CALCIUM-VITAMIN D (OSCAL WITH D) 500-200 MG-UNIT PER TABLET    Take 1 tablet by mouth daily with breakfast.    CARVEDILOL (COREG) 6.25 MG TABLET    TAKE 2 TABS BY MOUTH IN AM AND 1 TAB BY MOUTH IN PM FOR BLOOD PRESSURE   CLOPIDOGREL (PLAVIX) 75 MG TABLET    Take 1 tablet (75 mg total) by mouth daily with breakfast.  COLLAGENASE (SANTYL) OINTMENT    Apply 1 application topically daily.   CYCLOSPORINE (RESTASIS) 0.05 % OPHTHALMIC EMULSION    USE 1 DROP INTO BOTH EYES TWICE DAILY   ENOXAPARIN (LOVENOX) 60 MG/0.6ML INJECTION    Inject 0.6 mLs (60 mg total) into the skin daily.   FEEDING SUPPLEMENT (OSMOLITE 1 CAL) LIQD    Take 711 mLs by mouth at bedtime.    FERROUS SULFATE 325 (65 FE) MG TABLET    Take one tablet by mouth once daily with breakfast   FLUTICASONE (FLONASE) 50 MCG/ACT NASAL SPRAY    Place 2 sprays into both nostrils daily.   FUROSEMIDE (LASIX) 40 MG TABLET    Take 1 tablet (40 mg total) by mouth daily.   GABAPENTIN (NEURONTIN) 300 MG CAPSULE    TAKE 2 CAPSULES (600 MG TOTAL) BY MOUTH 3 (THREE) TIMES DAILY.   GENTAMICIN CREAM (GARAMYCIN) 0.1 %    Apply 1 application topically 3 (three) times daily.   HYDRALAZINE (APRESOLINE) 50 MG TABLET    Take 1 tablet (50 mg total) by mouth 3 (three) times daily.   INSULIN ASPART (NOVOLOG FLEXPEN) 100 UNIT/ML FLEXPEN    Take three times daily before each meal based on sliding scale 140-199( 2 units), 200-250 (4 units), 251-299 (6 units), 300-349 (8 units), > 350(10 units)   INSULIN GLARGINE (LANTUS) 100 UNIT/ML SOLOSTAR PEN    Injection 0.1 ml (10 units) once daily to control blood sugar. Dx E11.9   INSULIN PEN NEEDLE (B-D UF III MINI PEN NEEDLES) 31G X 5 MM MISC    Use twice daily with giving insulin injections. Dx E11.9   IPRATROPIUM-ALBUTEROL (DUONEB) 0.5-2.5 (3) MG/3ML SOLN    Take 3 mLs by nebulization every 6 (six) hours as needed (sob). Use 3 times daily x 4 days then every 6 hours as needed.   ISOSORBIDE MONONITRATE (IMDUR) 30 MG 24 HR TABLET     Take 2 tablets (60 mg total) by mouth daily.   LATANOPROST (XALATAN) 0.005 % OPHTHALMIC SOLUTION    Place 1 drop into both eyes at bedtime.   LEVALBUTEROL (XOPENEX HFA) 45 MCG/ACT INHALER    Inhale 2 puffs into the lungs every 6 (six) hours as needed for wheezing or shortness of breath.   LEVOTHYROXINE (SYNTHROID, LEVOTHROID) 137 MCG TABLET    TAKE 1 TABLET (137 MCG TOTAL) BY MOUTH DAILY BEFORE BREAKFAST.   MALTODEXTRIN-XANTHAN GUM (RESOURCE THICKENUP CLEAR) POWD    Take 120 g by mouth as needed (use to thicken liquids.).   MOMETASONE-FORMOTEROL (DULERA) 200-5 MCG/ACT AERO    Inhale 2 puffs into the lungs 2 (two) times daily.   MULTIPLE VITAMIN (MULTIVITAMIN WITH MINERALS) TABS TABLET    Take 1 tablet by mouth daily.   NYSTATIN (MYCOSTATIN/NYSTOP) POWDER    Apply topically 3 (three) times daily.   OXYCODONE (ROXICODONE) 5 MG IMMEDIATE RELEASE TABLET    Take 1 tablet (5 mg total) by mouth every 4 (four) hours as needed for severe pain. May take 1/2 tab q4hrs as needed for mild pain.   OXYGEN    Inhale 2 L into the lungs at bedtime.   PANTOPRAZOLE (PROTONIX) 40 MG TABLET    Take 1 tablet (40 mg total) by mouth 2 (two) times daily.   POLYETHYL GLYCOL-PROPYL GLYCOL 0.4-0.3 % SOLN    Place 1 drop into both eyes 4 (four) times daily.    SERTRALINE (ZOLOFT) 100 MG TABLET    TAKE ONE TABLET BY MOUTH ONCE DAILY FOR DEPRESSION  WARFARIN (COUMADIN) 2.5 MG TABLET    TAKE AS DIRECTED BY COUMADIN CLINIC.   WATER FOR IRRIGATION, STERILE (FREE WATER) SOLN    Place 60 mLs into feeding tube 3 (three) times daily.    WHITE PETROLATUM-MINERAL OIL (SYSTANE NIGHTTIME) OINT    Apply 1 application to eye at bedtime. Both eyes  Modified Medications   No medications on file  Discontinued Medications   No medications on file     Physical Exam:  Vitals:   07/03/17 1509 07/03/17 1535  BP: (!) 164/80 128/62  Pulse: 74   Temp: 98.3 F (36.8 C)   TempSrc: Oral   SpO2: 99%   Weight: 161 lb (73 kg)   Height: 5\' 5"   (1.651 m)    Body mass index is 26.79 kg/m.  Physical Exam  Constitutional: She is oriented to person, place, and time. She appears well-developed and well-nourished.  Neck: Normal range of motion. Neck supple.  Cardiovascular: Normal rate and regular rhythm.  Murmur heard. Pulmonary/Chest: Effort normal and breath sounds normal.  Abdominal: Soft. Bowel sounds are normal.  Musculoskeletal: She exhibits edema (1+bilaterally).  Neurological: She is alert and oriented to person, place, and time.  Skin: Skin is warm and dry. No erythema.  Right toes covered in bandage, CDI  Psychiatric: She has a normal mood and affect.    Labs reviewed: Basic Metabolic Panel: Recent Labs    07/21/16 1124  08/16/16 0616  08/18/16 0517  09/25/16 0524  02/22/17 0436 02/23/17 0330 06/06/17 1755 06/27/17 0937 06/28/17 0324  NA  --    < > 141   < > 135   < > 136   < > 141 137 139 137 135  K  --    < > 4.2   < > 4.0   < > 4.6   < > 4.2 4.1 4.1 5.8* 4.1  CL  --    < > 107   < > 101   < > 105   < > 114* 111 102 101 101  CO2  --    < > 25   < > 24   < > 25   < > 22 22 28   --  25  GLUCOSE  --    < > 350*   < > 154*   < > 106*   < > 146* 142* 103* 121* 263*  BUN  --    < > 54*   < > 49*   < > 32*   < > 32* 24* 73* 75* 48*  CREATININE  --    < > 1.04*   < > 0.96   < > 1.03*   < > 1.03* 0.99 1.67* 1.60* 1.41*  CALCIUM  --    < > 9.4   < > 9.1   < > 8.7*   < > 8.9 9.0 9.5  --  8.8*  MG  --    < > 1.9  --  1.9  --   --   --  1.9 1.6*  --   --   --   PHOS  --    < > 2.2*  --   --   --  3.5  --   --  2.1*  --   --   --   TSH 4.97*  --   --   --  0.670  --   --   --   --   --   --   --   --    < > =  values in this interval not displayed.   Liver Function Tests: Recent Labs    09/26/16 0435 10/03/16 1610 11/02/16 1710  AST 25 20 24   ALT 31 25 19   ALKPHOS 57 73 58  BILITOT 0.4 0.4 0.5  PROT 5.4* 7.1 7.0  ALBUMIN 2.0* 3.2* 3.5   No results for input(s): LIPASE, AMYLASE in the last 8760 hours. No  results for input(s): AMMONIA in the last 8760 hours. CBC: Recent Labs    02/21/17 0440 02/22/17 0436 02/23/17 0330 06/06/17 1755 06/27/17 0937 06/28/17 0324  WBC 6.4 6.3 7.4 7.0  --  6.9  NEUTROABS 3.3 3.3 4.4  --   --   --   HGB 8.8* 8.9* 9.3* 9.5* 9.5* 7.8*  HCT 27.0* 26.9* 27.8* 29.0* 28.0* 23.8*  MCV 94.1 93.4 91.7 90.9  --  86.9  PLT 233 238 250 282  --  247   Lipid Panel: No results for input(s): CHOL, HDL, LDLCALC, TRIG, CHOLHDL, LDLDIRECT in the last 8760 hours. TSH: Recent Labs    07/21/16 1124 08/18/16 0517  TSH 4.97* 0.670   A1C: Lab Results  Component Value Date   HGBA1C 7.7 (H) 08/16/2016     Assessment/Plan 1. PAD (peripheral artery disease) (HCC) S/p RLE arteriogram with DCB angioplasty of R SFA and plain balloon angioplasty of tibioperoneal trunk following with podiatry for wound care and vascular. Continues on plavix and coumadin   2. Persistent atrial fibrillation (HCC) Following with coumadin clinic for INR, currently on lovenox until INR therapeutic   3. Gastroesophageal reflux disease without esophagitis -stable, conts on protonix  4. Diabetic ulcer of toe of right foot associated with type 2 diabetes mellitus, with necrosis of muscle (HCC) -will need follow up A1c, continues on novolog and lantus - Hemoglobin A1c; Future  5. Other specified hypothyroidism -continues on synthroid 137 mcg, will follow up TSH with next labs - TSH; Future  6. Chronic kidney insufficiency, stage 3 (moderate) (HCC) -Encourage proper hydration and to avoid NSAIDS (Aleve, Advil, Motrin, Ibuprofen)   7. Anemia of chronic disease -remains on iron supplement  - CBC with Differential/Platelets; Future  8. Dyslipidemia Remains on Lipitor  - Lipid Panel; Future - COMPLETE METABOLIC PANEL WITH GFR; Future  9. Weakness -home health PT has been ordered, continues to work with them due to weakness.   10. OA of right knee Worsening pain since procedure to right  knee, has PT scheduled, can also use tylenol 500 mg 2 tablets =1000 mg by mouth every 8 hours as needed for knee pain To use muscle rub as needed on knee  Next appt:1 month with fasting blood work prior to [employer]. [place], [facility] [phone]

## 2017-07-05 DIAGNOSIS — J449 Chronic obstructive pulmonary disease, unspecified: Secondary | ICD-10-CM | POA: Diagnosis not present

## 2017-07-05 DIAGNOSIS — I130 Hypertensive heart and chronic kidney disease with heart failure and stage 1 through stage 4 chronic kidney disease, or unspecified chronic kidney disease: Secondary | ICD-10-CM | POA: Diagnosis not present

## 2017-07-05 DIAGNOSIS — Z48812 Encounter for surgical aftercare following surgery on the circulatory system: Secondary | ICD-10-CM | POA: Diagnosis not present

## 2017-07-05 DIAGNOSIS — E1151 Type 2 diabetes mellitus with diabetic peripheral angiopathy without gangrene: Secondary | ICD-10-CM | POA: Diagnosis not present

## 2017-07-05 DIAGNOSIS — I70209 Unspecified atherosclerosis of native arteries of extremities, unspecified extremity: Secondary | ICD-10-CM | POA: Diagnosis not present

## 2017-07-05 DIAGNOSIS — I2510 Atherosclerotic heart disease of native coronary artery without angina pectoris: Secondary | ICD-10-CM | POA: Diagnosis not present

## 2017-07-06 ENCOUNTER — Ambulatory Visit (HOSPITAL_COMMUNITY)
Admission: RE | Admit: 2017-07-06 | Discharge: 2017-07-06 | Disposition: A | Discharge status: Home / Self Care | Payer: Medicare Other | Source: Ambulatory Visit | Attending: Radiology | Admitting: Radiology

## 2017-07-06 ENCOUNTER — Encounter (HOSPITAL_COMMUNITY): Payer: Self-pay | Admitting: Interventional Radiology

## 2017-07-06 ENCOUNTER — Other Ambulatory Visit (HOSPITAL_COMMUNITY): Payer: Self-pay | Admitting: Radiology

## 2017-07-06 ENCOUNTER — Ambulatory Visit (INDEPENDENT_AMBULATORY_CARE_PROVIDER_SITE_OTHER): Payer: Medicare Other | Admitting: *Deleted

## 2017-07-06 DIAGNOSIS — Z434 Encounter for attention to other artificial openings of digestive tract: Secondary | ICD-10-CM | POA: Diagnosis not present

## 2017-07-06 DIAGNOSIS — Z5181 Encounter for therapeutic drug level monitoring: Secondary | ICD-10-CM | POA: Diagnosis not present

## 2017-07-06 DIAGNOSIS — R633 Feeding difficulties, unspecified: Secondary | ICD-10-CM

## 2017-07-06 DIAGNOSIS — Z431 Encounter for attention to gastrostomy: Secondary | ICD-10-CM | POA: Diagnosis not present

## 2017-07-06 DIAGNOSIS — I4892 Unspecified atrial flutter: Secondary | ICD-10-CM

## 2017-07-06 HISTORY — PX: IR GJ TUBE CHANGE: IMG1440

## 2017-07-06 LAB — POCT INR: INR: 2.9

## 2017-07-06 MED ORDER — IOPAMIDOL (ISOVUE-300) INJECTION 61%
INTRAVENOUS | Status: AC
  Administered 2017-07-06: 10 mL
  Filled 2017-07-06: qty 50

## 2017-07-06 MED ORDER — IOPAMIDOL (ISOVUE-300) INJECTION 61%
10.0000 mL | Freq: Once | INTRAVENOUS | Status: AC | PRN
  Administered 2017-07-06: 10 mL

## 2017-07-06 NOTE — Patient Instructions (Signed)
Description   Continue normal dose of 5mg  daily except 2.5mg  on Fridays. Stop Lovenox injections. Recheck in 2 weeks.

## 2017-07-06 NOTE — Procedures (Signed)
GJ exchange EBL 0 Comp 0

## 2017-07-09 ENCOUNTER — Other Ambulatory Visit: Payer: Self-pay

## 2017-07-09 ENCOUNTER — Other Ambulatory Visit (HOSPITAL_COMMUNITY): Payer: Medicare Other

## 2017-07-09 DIAGNOSIS — I70262 Atherosclerosis of native arteries of extremities with gangrene, left leg: Secondary | ICD-10-CM

## 2017-07-09 DIAGNOSIS — I739 Peripheral vascular disease, unspecified: Secondary | ICD-10-CM

## 2017-07-09 IMAGING — US US RENAL
1 series · 14 of 25 positions shown · non-contrast
Comparison: CT scan 09/11/2015

CLINICAL DATA: Acute kidney insufficiency

EXAM:
RENAL / URINARY TRACT ULTRASOUND COMPLETE

[Series 1: us renal · 0.25mm/px · 14 of 56 slices shown]
[im 1/56]
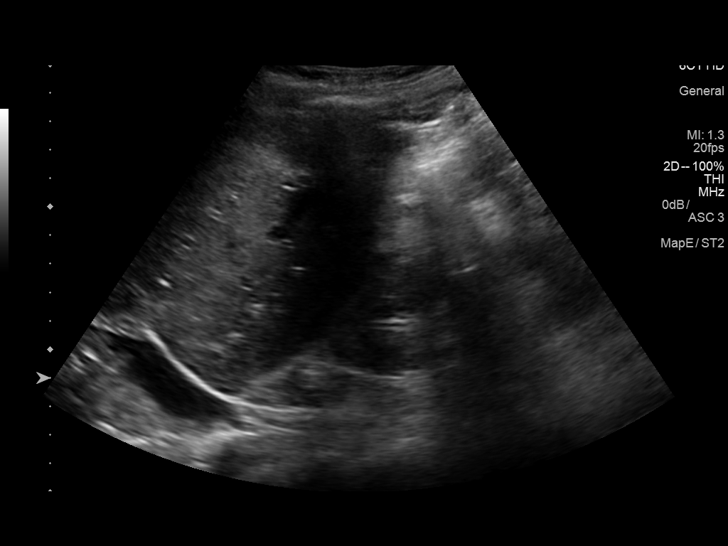
[im 5/56]
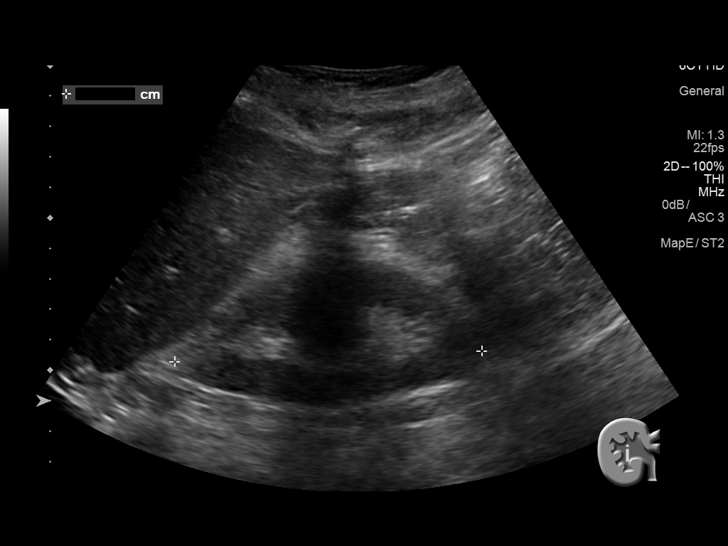
[im 10/56]
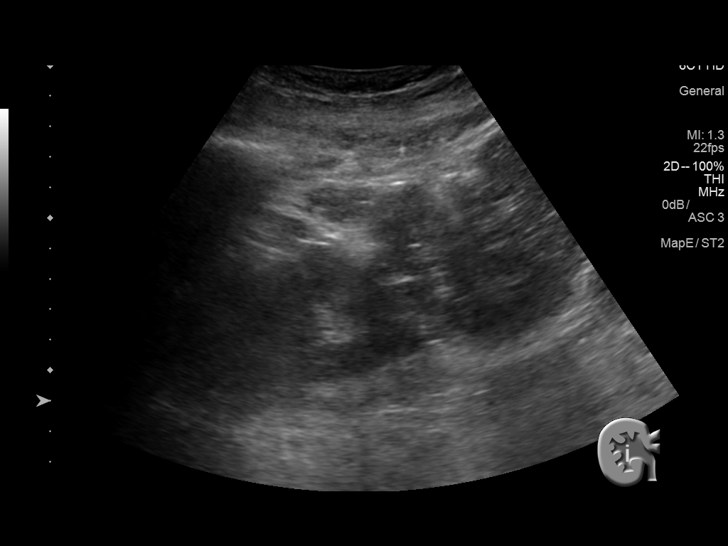
[im 14/56]
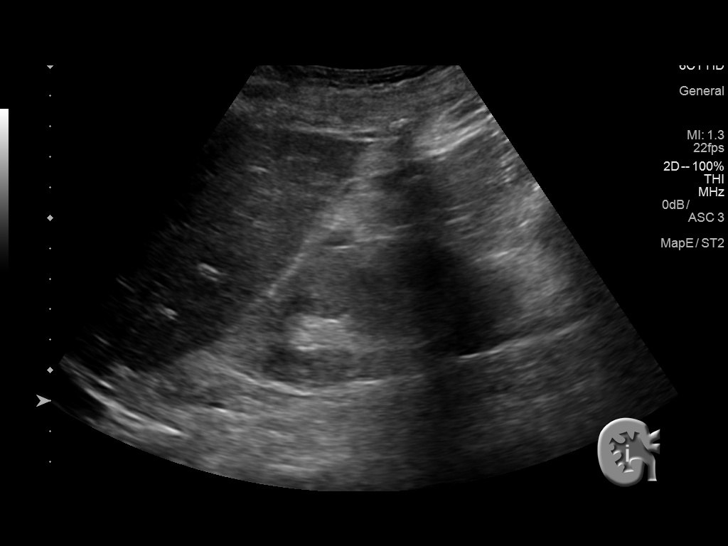
[im 19/56]
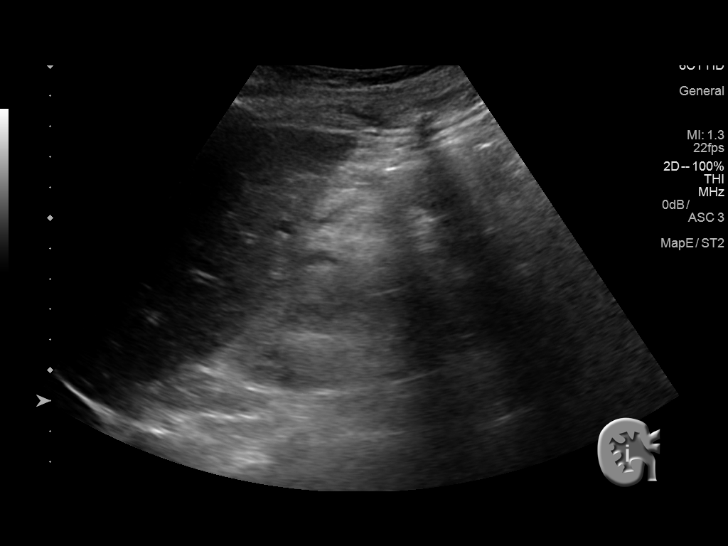
[im 21/56]
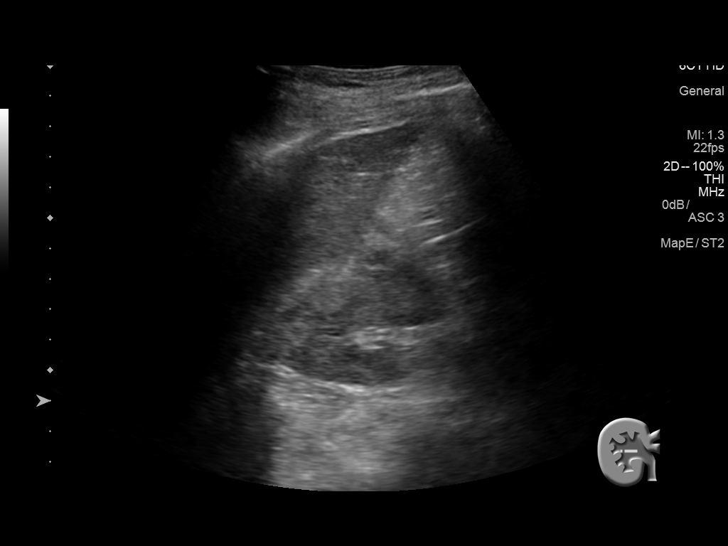
[im 26/56]
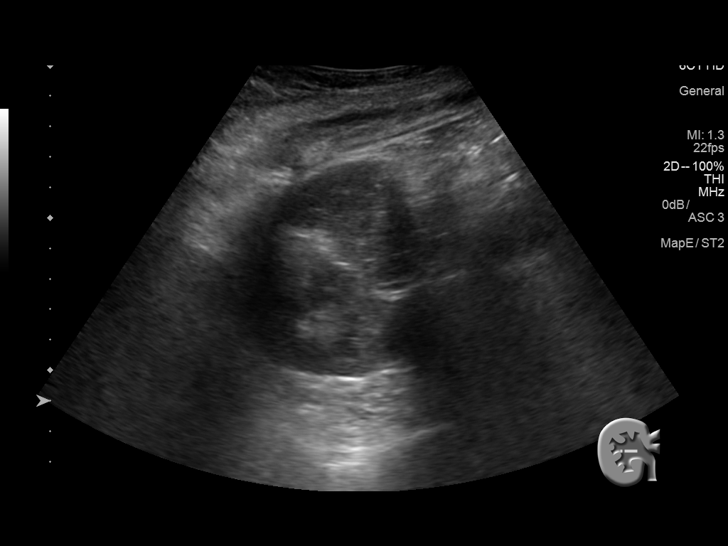
[im 30/56]
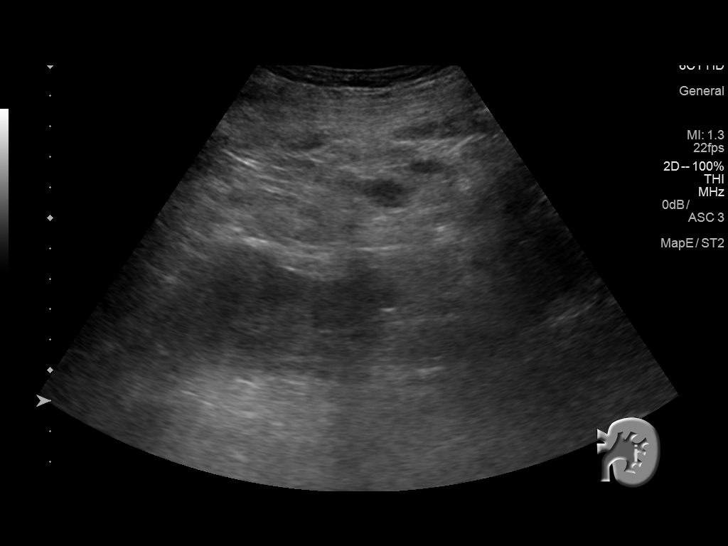
[im 35/56]
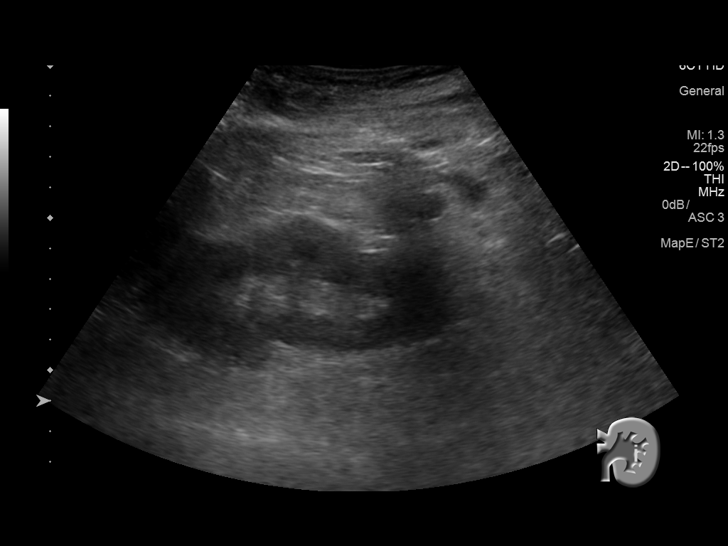
[im 37/56]
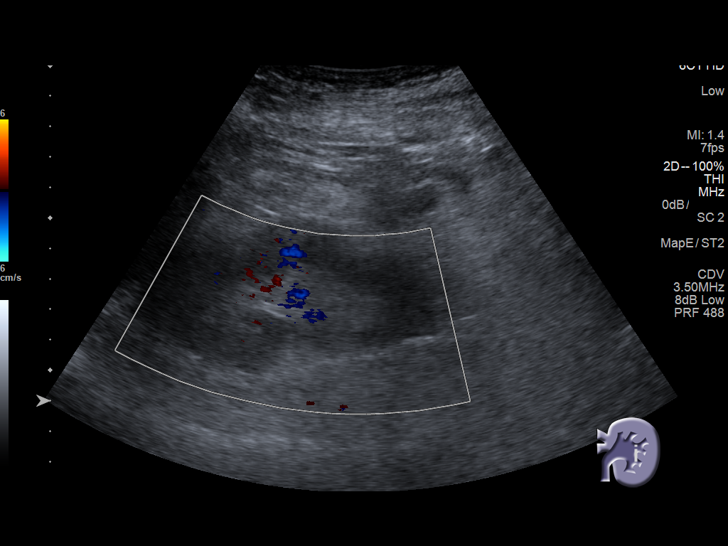
[im 42/56]
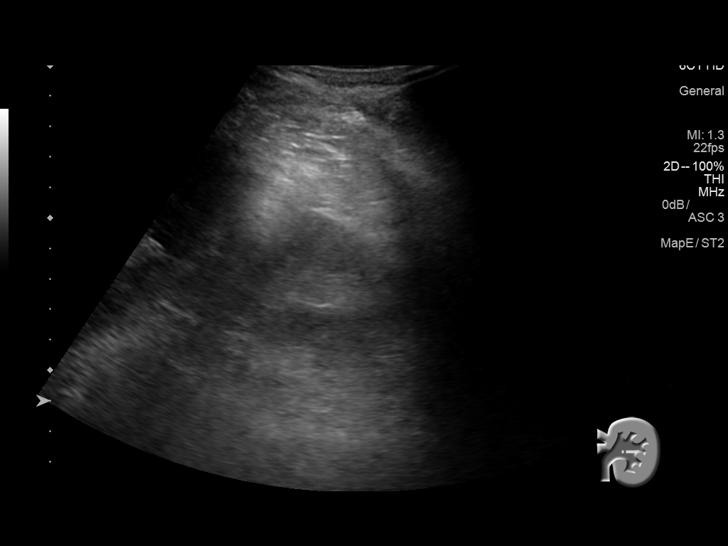
[im 46/56]
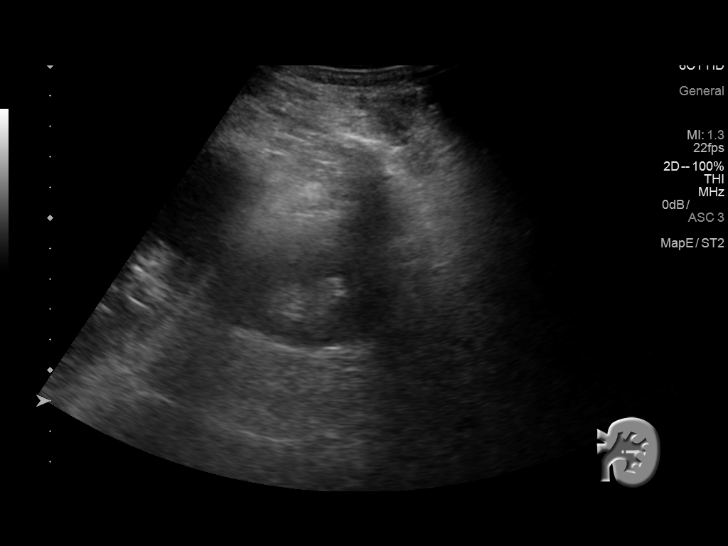
[im 51/56]
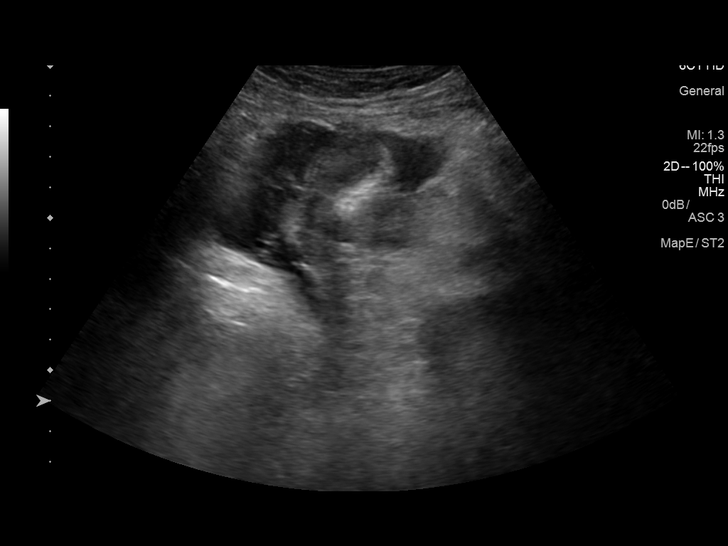
[im 56/56]
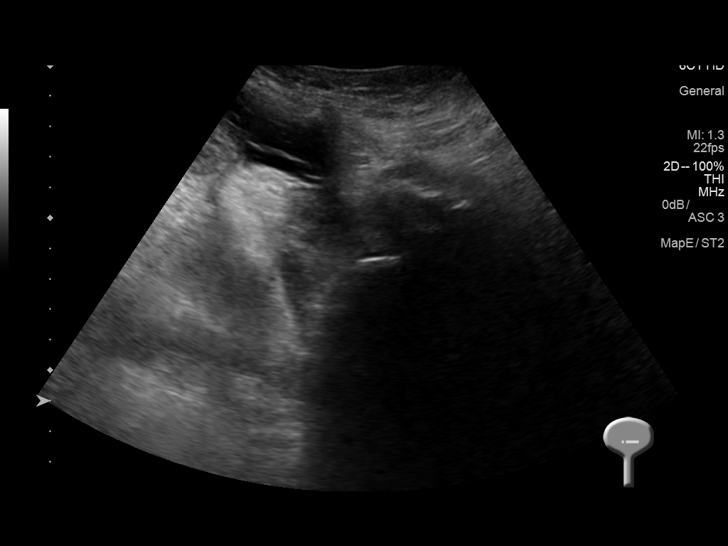

[14 of 25 positions shown; findings below may reference images not displayed]

FINDINGS: Right Kidney:

Length: 10.1 cm. There is cortical increased echogenicity suspicious
for medical renal disease. No hydronephrosis. No shadowing renal
calculi. No renal mass.

Left Kidney:

Length: 10.2 cm. Cortical increased echogenicity suspicious for
medical renal disease. No hydronephrosis. No renal calculi. No renal
mass.

Bladder:

Urinary bladder is not visualized. Small abdominal ascites. Right
pleural effusion.
IMPRESSION: 1. Bilateral renal mild increased echogenicity suspicious for
medical renal disease. Bilateral no hydronephrosis or renal calculi.
Urinary bladder is not visualized. Small abdominal ascites. Right
pleural effusion.

## 2017-07-10 DIAGNOSIS — I2510 Atherosclerotic heart disease of native coronary artery without angina pectoris: Secondary | ICD-10-CM | POA: Diagnosis not present

## 2017-07-10 DIAGNOSIS — Z48812 Encounter for surgical aftercare following surgery on the circulatory system: Secondary | ICD-10-CM | POA: Diagnosis not present

## 2017-07-10 DIAGNOSIS — I130 Hypertensive heart and chronic kidney disease with heart failure and stage 1 through stage 4 chronic kidney disease, or unspecified chronic kidney disease: Secondary | ICD-10-CM | POA: Diagnosis not present

## 2017-07-10 DIAGNOSIS — I70209 Unspecified atherosclerosis of native arteries of extremities, unspecified extremity: Secondary | ICD-10-CM | POA: Diagnosis not present

## 2017-07-10 DIAGNOSIS — J449 Chronic obstructive pulmonary disease, unspecified: Secondary | ICD-10-CM | POA: Diagnosis not present

## 2017-07-10 DIAGNOSIS — E1151 Type 2 diabetes mellitus with diabetic peripheral angiopathy without gangrene: Secondary | ICD-10-CM | POA: Diagnosis not present

## 2017-07-12 DIAGNOSIS — E1151 Type 2 diabetes mellitus with diabetic peripheral angiopathy without gangrene: Secondary | ICD-10-CM | POA: Diagnosis not present

## 2017-07-12 DIAGNOSIS — I2510 Atherosclerotic heart disease of native coronary artery without angina pectoris: Secondary | ICD-10-CM | POA: Diagnosis not present

## 2017-07-12 DIAGNOSIS — Z48812 Encounter for surgical aftercare following surgery on the circulatory system: Secondary | ICD-10-CM | POA: Diagnosis not present

## 2017-07-12 DIAGNOSIS — I70209 Unspecified atherosclerosis of native arteries of extremities, unspecified extremity: Secondary | ICD-10-CM | POA: Diagnosis not present

## 2017-07-12 DIAGNOSIS — I130 Hypertensive heart and chronic kidney disease with heart failure and stage 1 through stage 4 chronic kidney disease, or unspecified chronic kidney disease: Secondary | ICD-10-CM | POA: Diagnosis not present

## 2017-07-12 DIAGNOSIS — J449 Chronic obstructive pulmonary disease, unspecified: Secondary | ICD-10-CM | POA: Diagnosis not present

## 2017-07-16 ENCOUNTER — Ambulatory Visit (INDEPENDENT_AMBULATORY_CARE_PROVIDER_SITE_OTHER): Payer: Medicare Other | Admitting: Podiatry

## 2017-07-16 ENCOUNTER — Encounter: Payer: Self-pay | Admitting: Podiatry

## 2017-07-16 DIAGNOSIS — E0859 Diabetes mellitus due to underlying condition with other circulatory complications: Secondary | ICD-10-CM

## 2017-07-16 DIAGNOSIS — L97512 Non-pressure chronic ulcer of other part of right foot with fat layer exposed: Secondary | ICD-10-CM | POA: Diagnosis not present

## 2017-07-16 DIAGNOSIS — I70235 Atherosclerosis of native arteries of right leg with ulceration of other part of foot: Secondary | ICD-10-CM

## 2017-07-16 NOTE — Progress Notes (Signed)
Subjective:  84 year old female presenting today for follow up evaluation of ulcerations of the right foot.  The patient recently underwent revascularization procedure with stent placement by Dr. Servando Snare, vein and vascular specialist on 06/27/2017.  Patient has noticed increased swelling to the right lower extremity since the procedure.  She has been applying Betadine to the wounds of the digits of the right foot daily.  She presents today for further treatment and evaluation.  Past Medical History:  Diagnosis Date  . Acute respiratory failure ([place])   . Anemia    previous blood transfusions  . Arthritis    "all over"  . Asthma   . Bradycardia    requiring previous d/c of BB and reduction of amiodarone  . CAD (coronary artery disease)    nonobstructive per notes  . Chronic diastolic CHF (congestive heart failure) ([place])   . CKD (chronic kidney disease), stage III ([place])   . Complication of blood transfusion    "got the wrong blood type at [facility] in ~ 2015; no adverse reaction that we are aware of"/daughter, [name] (01/27/2016)  . COPD (chronic obstructive pulmonary disease) ([place])   . Depression    "light case"  . DVT (deep venous thrombosis) ([place]) 01/2016   a. LLE DVT 01/2016 - switched from Eliquis to Coumadin.  . Gastric stenosis    a. s/p stomach tube  . GERD (gastroesophageal reflux disease)   . History of blood transfusion    "several" (01/27/2016)  . History of stomach ulcers   . Hyperlipidemia   . Hypertension   . Hypothyroidism   . On home oxygen therapy    "2L; 9PM til 9AM" (06/27/2017)  . Paraesophageal hernia   . Perforated gastric ulcer ([place])   . Pneumonia    "a few times" (06/27/2017)  . Seasonal allergies   . SIADH (syndrome of inappropriate ADH production) ([place])    [name] 01/10/2015  . Small bowel obstruction ([place])    "I don't know how many" (01/11/2015)  . Stroke ([place])    "light one"  . Type II diabetes mellitus ([place])    "related to prednisone use   for > 20 yr; once predinose stopped; no more DM RX" (01/27/2016)  . UTI (urinary tract infection) 02/08/2016  . Ventral hernia with bowel obstruction         Objective/Physical Exam General: The patient is alert and oriented x3 in no acute distress.  Dermatology:  Wounds noted to the distal tufts of digits 1, 3, 4, and 5 with complete loss of the toenails to the respective digits noted today after debridement.  Ulcerations appear similar in nature with the right great toe wound measuring approximately 2.5 x 2.5 x 0.3 cm.   Wounds noted to digits 3 4 and 5 measure approximately 1 cm in diameter times 0.2 cm in depth.  Please see picture above  To the noted ulceration(s), there is no eschar. There is a moderate amount of slough, fibrin, and necrotic tissue noted. Granulation tissue and wound base is red. There is a minimal amount of serosanguineous drainage noted. There is no exposed bone muscle-tendon ligament or joint. There is no malodor. Periwound integrity is intact. Skin is warm, dry and supple bilateral lower extremities.  Vascular: Diminished pedal pulses significant in the right lower extremity. No edema or erythema noted.  Extremities are cold to touch  Neurological: Epicritic and protective threshold absent bilaterally.   Musculoskeletal Exam: History of first and second toe amputations left foot  Assessment: #1 ulceration of the right great toe, third, fourth, and fifth digit secondary to diabetes mellitus #2 diabetes mellitus w/ peripheral neuropathy #3 acute ischemic changes third digit right foot #4 acute critical limb ischemia right foot  Plan of Care:  #1 Patient was evaluated. #2  Orders were placed today for home health dressing changes every other day consisting of Betadine wet-to-dry dressings #3 Medically necessary excisional debridement of the right great toe ulceration including subcutaneous tissue was performed using a tissue nipper and a chisel blade.  Excisional debridement of all the necrotic nonviable tissue down to healthy bleeding viable tissue was performed with post-debridement measurements same as pre-. #5 The wound was cleansed and dry sterile dressing applied.  Keep clean dry and intact for 1 week or until home health comes to the home for dressing changes #6 Return to clinic in 1 week   Edrick Kins, DPM [facility]  Dr. Edrick Kins, [address]                                        [place], [place] [address]                Office [phone]  Fax [phone]

## 2017-07-17 ENCOUNTER — Telehealth: Payer: Self-pay | Admitting: *Deleted

## 2017-07-17 NOTE — Telephone Encounter (Signed)
Faxed Dr. Amalia Hailey 07/16/2017 5:18pm orders to [facility].

## 2017-07-17 NOTE — Telephone Encounter (Signed)
-----   Message from Edrick Kins, DPM sent at 07/16/2017  5:18 PM EDT ----- Regarding: Home health dressing change orders Please provide home health dressing change orders right foot toe ulcers, mutiple.  -Cleanse with normal saline or wound cleanser.  Dry. -Pink toes with Betadine and applied Betadine wet-to-dry dressings -4 x 4 gauze, large Kerlix, lightly wrapped Ace or Coban  Dx: Ulcers right foot secondary to diabetes mellitus and peripheral vascular disease  Thanks, Dr. Amalia Hailey

## 2017-07-18 ENCOUNTER — Other Ambulatory Visit: Payer: Self-pay | Admitting: Cardiology

## 2017-07-18 DIAGNOSIS — I2510 Atherosclerotic heart disease of native coronary artery without angina pectoris: Secondary | ICD-10-CM | POA: Diagnosis not present

## 2017-07-18 DIAGNOSIS — Z48812 Encounter for surgical aftercare following surgery on the circulatory system: Secondary | ICD-10-CM | POA: Diagnosis not present

## 2017-07-18 DIAGNOSIS — J449 Chronic obstructive pulmonary disease, unspecified: Secondary | ICD-10-CM | POA: Diagnosis not present

## 2017-07-18 DIAGNOSIS — I70209 Unspecified atherosclerosis of native arteries of extremities, unspecified extremity: Secondary | ICD-10-CM | POA: Diagnosis not present

## 2017-07-18 DIAGNOSIS — I130 Hypertensive heart and chronic kidney disease with heart failure and stage 1 through stage 4 chronic kidney disease, or unspecified chronic kidney disease: Secondary | ICD-10-CM | POA: Diagnosis not present

## 2017-07-18 DIAGNOSIS — E1151 Type 2 diabetes mellitus with diabetic peripheral angiopathy without gangrene: Secondary | ICD-10-CM | POA: Diagnosis not present

## 2017-07-19 ENCOUNTER — Telehealth: Payer: Self-pay

## 2017-07-19 DIAGNOSIS — E1151 Type 2 diabetes mellitus with diabetic peripheral angiopathy without gangrene: Secondary | ICD-10-CM | POA: Diagnosis not present

## 2017-07-19 DIAGNOSIS — Z48812 Encounter for surgical aftercare following surgery on the circulatory system: Secondary | ICD-10-CM | POA: Diagnosis not present

## 2017-07-19 DIAGNOSIS — J449 Chronic obstructive pulmonary disease, unspecified: Secondary | ICD-10-CM | POA: Diagnosis not present

## 2017-07-19 DIAGNOSIS — I2510 Atherosclerotic heart disease of native coronary artery without angina pectoris: Secondary | ICD-10-CM | POA: Diagnosis not present

## 2017-07-19 DIAGNOSIS — I70209 Unspecified atherosclerosis of native arteries of extremities, unspecified extremity: Secondary | ICD-10-CM | POA: Diagnosis not present

## 2017-07-19 DIAGNOSIS — I130 Hypertensive heart and chronic kidney disease with heart failure and stage 1 through stage 4 chronic kidney disease, or unspecified chronic kidney disease: Secondary | ICD-10-CM | POA: Diagnosis not present

## 2017-07-19 NOTE — Telephone Encounter (Signed)
Discussed response with Shannon [name]. I had Shannon [name] repeat instructions to me to confirm understanding. Medication list updated to reflect change in carvedilol

## 2017-07-19 NOTE — Telephone Encounter (Signed)
I called Shannon [name], per Mr.Armstrong Shannon [name] was not at home.   I called Shannon [name] on her cell phone to inform her that I have yet to get a response from [facility] and I will call once recommendations received.  Shannon [name] states she has her mother resting and had her drink a little bit of water. B/P was 130/51 when last checked.

## 2017-07-19 NOTE — Telephone Encounter (Signed)
Patient called c/o low blood pressure  1. What are your blood pressure readings? 115/44  2. When did you check your blood pressure (before or after medications)? After medications   3. Any associated symptoms like dizziness, light-headed, or falls? Patient feels swimmy headed  4. Are you taking any new medications? No  Patient has eaten today. Medication list confirmed with patient's daughter, [name]   Please advise

## 2017-07-19 NOTE — Telephone Encounter (Signed)
Reduce carvedilol to 1 tab BID and continue other meds as ordered; monitor BP and HR at home (call if <100/50 and HR <50)

## 2017-07-20 ENCOUNTER — Ambulatory Visit (INDEPENDENT_AMBULATORY_CARE_PROVIDER_SITE_OTHER): Payer: Medicare Other

## 2017-07-20 DIAGNOSIS — Z5181 Encounter for therapeutic drug level monitoring: Secondary | ICD-10-CM | POA: Diagnosis not present

## 2017-07-20 DIAGNOSIS — I4892 Unspecified atrial flutter: Secondary | ICD-10-CM

## 2017-07-20 LAB — POCT INR: INR: 2.8

## 2017-07-20 NOTE — Patient Instructions (Signed)
Description   Continue on same dosage 2 tablets daily except 1 tablet on Fridays.  Recheck in 3 weeks.

## 2017-07-23 ENCOUNTER — Encounter: Payer: Self-pay | Admitting: Podiatry

## 2017-07-23 ENCOUNTER — Ambulatory Visit (INDEPENDENT_AMBULATORY_CARE_PROVIDER_SITE_OTHER): Payer: Medicare Other | Admitting: Podiatry

## 2017-07-23 DIAGNOSIS — L97512 Non-pressure chronic ulcer of other part of right foot with fat layer exposed: Secondary | ICD-10-CM | POA: Diagnosis not present

## 2017-07-23 DIAGNOSIS — I2510 Atherosclerotic heart disease of native coronary artery without angina pectoris: Secondary | ICD-10-CM | POA: Diagnosis not present

## 2017-07-23 DIAGNOSIS — I70209 Unspecified atherosclerosis of native arteries of extremities, unspecified extremity: Secondary | ICD-10-CM | POA: Diagnosis not present

## 2017-07-23 DIAGNOSIS — E0859 Diabetes mellitus due to underlying condition with other circulatory complications: Secondary | ICD-10-CM

## 2017-07-23 DIAGNOSIS — E1151 Type 2 diabetes mellitus with diabetic peripheral angiopathy without gangrene: Secondary | ICD-10-CM | POA: Diagnosis not present

## 2017-07-23 DIAGNOSIS — J449 Chronic obstructive pulmonary disease, unspecified: Secondary | ICD-10-CM | POA: Diagnosis not present

## 2017-07-23 DIAGNOSIS — I130 Hypertensive heart and chronic kidney disease with heart failure and stage 1 through stage 4 chronic kidney disease, or unspecified chronic kidney disease: Secondary | ICD-10-CM | POA: Diagnosis not present

## 2017-07-23 DIAGNOSIS — Z48812 Encounter for surgical aftercare following surgery on the circulatory system: Secondary | ICD-10-CM | POA: Diagnosis not present

## 2017-07-23 DIAGNOSIS — I70235 Atherosclerosis of native arteries of right leg with ulceration of other part of foot: Secondary | ICD-10-CM

## 2017-07-24 ENCOUNTER — Telehealth: Payer: Self-pay | Admitting: *Deleted

## 2017-07-24 NOTE — Telephone Encounter (Signed)
I spoke with Barnabas Harries, she states the referral was opened by [facility] - Dr. Dellia Nims, and they informed [facility] would not be able to readmit due to chronic care needs beyond the scope of home health care, but if situation changed, pt was no longer chronic and there was a teachable caregiver at the home to assist with wound care pt could be readmitted.

## 2017-07-25 ENCOUNTER — Other Ambulatory Visit: Payer: Self-pay | Admitting: Internal Medicine

## 2017-07-25 NOTE — Progress Notes (Signed)
Subjective:  84 year old female presenting today for follow up evaluation of ulcerations of the right foot. She reports pain to the foot with an associated malodor. She states she has not heard back from home health yet. Patient is here for further evaluation and treatment.   Past Medical History:  Diagnosis Date  . Acute respiratory failure ([place])   . Anemia    previous blood transfusions  . Arthritis    "all over"  . Asthma   . Bradycardia    requiring previous d/c of BB and reduction of amiodarone  . CAD (coronary artery disease)    nonobstructive per notes  . Chronic diastolic CHF (congestive heart failure) ([place])   . CKD (chronic kidney disease), stage III ([place])   . Complication of blood transfusion    "got the wrong blood type at [facility] in ~ 2015; no adverse reaction that we are aware of"/daughter, [name] (01/27/2016)  . COPD (chronic obstructive pulmonary disease) ([place])   . Depression    "light case"  . DVT (deep venous thrombosis) ([place]) 01/2016   a. LLE DVT 01/2016 - switched from Eliquis to Coumadin.  . Gastric stenosis    a. s/p stomach tube  . GERD (gastroesophageal reflux disease)   . History of blood transfusion    "several" (01/27/2016)  . History of stomach ulcers   . Hyperlipidemia   . Hypertension   . Hypothyroidism   . On home oxygen therapy    "2L; 9PM til 9AM" (06/27/2017)  . Paraesophageal hernia   . Perforated gastric ulcer ([place])   . Pneumonia    "a few times" (06/27/2017)  . Seasonal allergies   . SIADH (syndrome of inappropriate ADH production) ([place])    [name] 01/10/2015  . Small bowel obstruction ([place])    "I don't know how many" (01/11/2015)  . Stroke ([place])    "light one"  . Type II diabetes mellitus ([place])    "related to prednisone use  for > 20 yr; once predinose stopped; no more DM RX" (01/27/2016)  . UTI (urinary tract infection) 02/08/2016  . Ventral hernia with bowel obstruction         Objective/Physical Exam General: The  patient is alert and oriented x3 in no acute distress.  Dermatology:  Wounds noted to the distal tufts of digits 1, 3, 4, and 5 with complete loss of the toenails to the respective digits noted today after debridement.  Ulcerations appear similar in nature with the right great toe wound measuring approximately 2.5 x 2.5 x 0.3 cm.   Wounds noted to digits 3 4 and 5 measure approximately 1 cm in diameter times 0.2 cm in depth.  Please see picture above  To the noted ulceration(s), there is no eschar. There is a moderate amount of slough, fibrin, and necrotic tissue noted. Granulation tissue and wound base is red. There is a minimal amount of serosanguineous drainage noted. There is no exposed bone muscle-tendon ligament or joint. There is no malodor. Periwound integrity is intact. Skin is warm, dry and supple bilateral lower extremities.  Vascular: Diminished pedal pulses significant in the right lower extremity. No edema or erythema noted.  Extremities are cold to touch  Neurological: Epicritic and protective threshold absent bilaterally.   Musculoskeletal Exam: History of first and second toe amputations left foot  Assessment: #1 ulceration of the right great toe, third, fourth, and fifth digit secondary to diabetes mellitus #2 diabetes mellitus w/ peripheral neuropathy #3 acute ischemic changes third digit right  foot #4 acute critical limb ischemia right foot  Plan of Care:  #1 Patient was evaluated. #2 Medically necessary excisional debridement of the right great toe ulceration including subcutaneous tissue was performed using a tissue nipper and a chisel blade. Excisional debridement of all the necrotic nonviable tissue down to healthy bleeding viable tissue was performed with post-debridement measurements same as pre-. #3 Dry sterile dressing applied.  #4 Recommended applying betadine daily. #5 Gradient compression socks dispensed.  #6 Continue weightbearing in post op shoe.  #7 Our  office will follow up regarding home health care.  #8 Return to clinic in two weeks.   Edrick Kins, DPM [facility]  Dr. Edrick Kins, [address]                                        [place], [place] [address]                Office [phone]  Fax [phone]

## 2017-07-26 DIAGNOSIS — Z48812 Encounter for surgical aftercare following surgery on the circulatory system: Secondary | ICD-10-CM | POA: Diagnosis not present

## 2017-07-26 DIAGNOSIS — J449 Chronic obstructive pulmonary disease, unspecified: Secondary | ICD-10-CM | POA: Diagnosis not present

## 2017-07-26 DIAGNOSIS — I70209 Unspecified atherosclerosis of native arteries of extremities, unspecified extremity: Secondary | ICD-10-CM | POA: Diagnosis not present

## 2017-07-26 DIAGNOSIS — E1151 Type 2 diabetes mellitus with diabetic peripheral angiopathy without gangrene: Secondary | ICD-10-CM | POA: Diagnosis not present

## 2017-07-26 DIAGNOSIS — I2510 Atherosclerotic heart disease of native coronary artery without angina pectoris: Secondary | ICD-10-CM | POA: Diagnosis not present

## 2017-07-26 DIAGNOSIS — I130 Hypertensive heart and chronic kidney disease with heart failure and stage 1 through stage 4 chronic kidney disease, or unspecified chronic kidney disease: Secondary | ICD-10-CM | POA: Diagnosis not present

## 2017-07-26 NOTE — Telephone Encounter (Signed)
Left message informing Shannon [name], pt's dtr Shannon [name] states she could be the teachable caregiver, but she did not understand about the chronic care needs statement.

## 2017-07-26 NOTE — Telephone Encounter (Signed)
I informed pt's dtr, [name] of [facility]'s explanation of the denial to accept pt into their agency's care and exception for the admittance to their care.

## 2017-07-27 ENCOUNTER — Ambulatory Visit (INDEPENDENT_AMBULATORY_CARE_PROVIDER_SITE_OTHER)
Admission: RE | Admit: 2017-07-27 | Discharge: 2017-07-27 | Disposition: A | Discharge status: Home / Self Care | Payer: Medicare Other | Source: Ambulatory Visit | Attending: Vascular Surgery | Admitting: Vascular Surgery

## 2017-07-27 ENCOUNTER — Encounter: Payer: Self-pay | Admitting: Vascular Surgery

## 2017-07-27 ENCOUNTER — Other Ambulatory Visit: Payer: Self-pay

## 2017-07-27 ENCOUNTER — Ambulatory Visit (INDEPENDENT_AMBULATORY_CARE_PROVIDER_SITE_OTHER): Payer: Medicare Other | Admitting: Vascular Surgery

## 2017-07-27 ENCOUNTER — Ambulatory Visit (HOSPITAL_COMMUNITY)
Admission: RE | Admit: 2017-07-27 | Discharge: 2017-07-27 | Disposition: A | Discharge status: Home / Self Care | Payer: Medicare Other | Source: Ambulatory Visit | Attending: Vascular Surgery | Admitting: Vascular Surgery

## 2017-07-27 VITALS — BP 189/81 | HR 75 | Temp 97.7°F | Resp 16 | Ht 66.0 in | Wt 157.0 lb

## 2017-07-27 DIAGNOSIS — I739 Peripheral vascular disease, unspecified: Secondary | ICD-10-CM | POA: Diagnosis not present

## 2017-07-27 DIAGNOSIS — I70262 Atherosclerosis of native arteries of extremities with gangrene, left leg: Secondary | ICD-10-CM | POA: Insufficient documentation

## 2017-07-27 DIAGNOSIS — H04123 Dry eye syndrome of bilateral lacrimal glands: Secondary | ICD-10-CM | POA: Diagnosis not present

## 2017-07-27 DIAGNOSIS — Z961 Presence of intraocular lens: Secondary | ICD-10-CM | POA: Diagnosis not present

## 2017-07-27 DIAGNOSIS — H16223 Keratoconjunctivitis sicca, not specified as Sjogren's, bilateral: Secondary | ICD-10-CM | POA: Diagnosis not present

## 2017-07-27 DIAGNOSIS — H401132 Primary open-angle glaucoma, bilateral, moderate stage: Secondary | ICD-10-CM | POA: Diagnosis not present

## 2017-07-27 NOTE — Telephone Encounter (Signed)
Shannon [name] states if [facility] would instruct her she felt they would be okay.

## 2017-07-27 NOTE — Progress Notes (Signed)
POST OPERATIVE OFFICE NOTE    CC:  F/u for surgery  HPI:  This is a 84 y.o. female who is s/p RLE angiogram,  drug coated balloon angioplasty right SFA and popliteal arteries with 58mm balloon, 57mm plain balloon angioplasty of tibioperoneal trunk on right and closure of left common femoral artery for continuous access with minx device on 06/27/17.    She subsequently had debridement of her toes by Dr. Amalia Hailey.  She is unsatisfied with the appearance of her toes but overall she is getting around well.  She remains on Plavix she also takes Coumadin.   Allergies  Allergen Reactions  . Penicillins Itching, Rash and Other (See Comments)    Tolerated unasyn, zosyn & cephalosporins in the past.  Has patient had a PCN reaction causing immediate rash, facial/tongue/throat swelling, SOB or lightheadedness with hypotension: Yes Has patient had a PCN reaction causing severe rash involving mucus membranes or skin necrosis: Unk Has patient had a PCN reaction that required hospitalization: Unk Has patient had a PCN reaction occurring within the last 10 years: Unk If all of the above answers are "NO", then may proceed with Cephalosporins  . Other     Patient tolerates amoxicillin and cephalosporins    Current Outpatient Medications  Medication Sig Dispense Refill  . acetaminophen (TYLENOL) 500 MG tablet Take 1,000 mg by mouth every 6 (six) hours as needed (pain).    . AMINO ACIDS-PROTEIN HYDROLYS PO Take 30 mLs by mouth 2 (two) times daily.    [name] atorvastatin (LIPITOR) 10 MG tablet TAKE 1 TABLET BY MOUTH EVERY DAY 90 tablet 1  . b complex vitamins tablet Take 1 tablet by mouth daily.     . calcium-vitamin D (OSCAL WITH D) 500-200 MG-UNIT per tablet Take 1 tablet by mouth daily with breakfast.     . carvedilol (COREG) 6.25 MG tablet Take 6.25 mg by mouth 2 (two) times daily.    . clopidogrel (PLAVIX) 75 MG tablet Take 1 tablet (75 mg total) by mouth daily with breakfast. 90 tablet 0  . collagenase  (SANTYL) ointment Apply 1 application topically daily. 30 g 1  . cycloSPORINE (RESTASIS) 0.05 % ophthalmic emulsion USE 1 DROP INTO BOTH EYES TWICE DAILY 60 mL 3  . enoxaparin (LOVENOX) 60 MG/0.6ML injection Inject 0.6 mLs (60 mg total) into the skin daily. 10 Syringe 0  . feeding supplement (OSMOLITE 1 CAL) LIQD Take 711 mLs by mouth at bedtime.     . ferrous sulfate 325 (65 FE) MG tablet Take one tablet by mouth once daily with breakfast 90 tablet 1  . fluticasone (FLONASE) 50 MCG/ACT nasal spray Place 2 sprays into both nostrils daily. 16 g 3  . furosemide (LASIX) 40 MG tablet Take 1 tablet (40 mg total) by mouth daily. 30 tablet 11  . gabapentin (NEURONTIN) 300 MG capsule TAKE 2 CAPSULES (600 MG TOTAL) BY MOUTH 3 (THREE) TIMES DAILY. 180 capsule 5  . gentamicin cream (GARAMYCIN) 0.1 % Apply 1 application topically 3 (three) times daily. 30 g 1  . hydrALAZINE (APRESOLINE) 50 MG tablet Take 1 tablet (50 mg total) by mouth 3 (three) times daily. 270 tablet 1  . insulin aspart (NOVOLOG FLEXPEN) 100 UNIT/ML FlexPen Take three times daily before each meal based on sliding scale 140-199( 2 units), 200-250 (4 units), 251-299 (6 units), 300-349 (8 units), > 350(10 units) 15 pen 11  . Insulin Glargine (LANTUS) 100 UNIT/ML Solostar Pen Injection 0.1 ml (10 units) once daily to control blood  sugar. Dx E11.9 15 mL 5  . Insulin Pen Needle (B-D UF III MINI PEN NEEDLES) 31G X 5 MM MISC Use twice daily with giving insulin injections. Dx E11.9 300 each 3  . ipratropium-albuterol (DUONEB) 0.5-2.5 (3) MG/3ML SOLN Take 3 mLs by nebulization every 6 (six) hours as needed (sob). Use 3 times daily x 4 days then every 6 hours as needed. 360 mL 4  . isosorbide mononitrate (IMDUR) 30 MG 24 hr tablet Take 2 tablets (60 mg total) by mouth daily. 180 tablet 2  . latanoprost (XALATAN) 0.005 % ophthalmic solution Place 1 drop into both eyes at bedtime. 2.5 mL 12  . levalbuterol (XOPENEX HFA) 45 MCG/ACT inhaler Inhale 2 puffs  into the lungs every 6 (six) hours as needed for wheezing or shortness of breath. 1 Inhaler 12  . levothyroxine (SYNTHROID, LEVOTHROID) 137 MCG tablet TAKE 1 TABLET (137 MCG TOTAL) BY MOUTH DAILY BEFORE BREAKFAST. 30 tablet 3  . Maltodextrin-Xanthan Gum (RESOURCE THICKENUP CLEAR) POWD Take 120 g by mouth as needed (use to thicken liquids.). 20 Can 0  . mometasone-formoterol (DULERA) 200-5 MCG/ACT AERO Inhale 2 puffs into the lungs 2 (two) times daily. 13 g 6  . Multiple Vitamin (MULTIVITAMIN WITH MINERALS) TABS tablet Take 1 tablet by mouth daily.    [name] nystatin (MYCOSTATIN/NYSTOP) powder Apply topically 3 (three) times daily. 15 g 0  . oxyCODONE (ROXICODONE) 5 MG immediate release tablet Take 1 tablet (5 mg total) by mouth every 4 (four) hours as needed for severe pain. May take 1/2 tab q4hrs as needed for mild pain. 30 tablet 0  . OXYGEN Inhale 2 L into the lungs at bedtime.    . pantoprazole (PROTONIX) 40 MG tablet Take 1 tablet (40 mg total) by mouth 2 (two) times daily. 60 tablet 3  . Polyethyl Glycol-Propyl Glycol 0.4-0.3 % SOLN Place 1 drop into both eyes 4 (four) times daily.     . sertraline (ZOLOFT) 100 MG tablet TAKE ONE TABLET BY MOUTH ONCE DAILY FOR DEPRESSION 90 tablet 1  . warfarin (COUMADIN) 2.5 MG tablet TAKE AS DIRECTED BY COUMADIN CLINIC. 65 tablet 1  . Water For Irrigation, Sterile (FREE WATER) SOLN Place 60 mLs into feeding tube 3 (three) times daily.     Dema Severin Petrolatum-Mineral Oil (SYSTANE NIGHTTIME) OINT Apply 1 application to eye at bedtime. Both eyes     No current facility-administered medications for this visit.      ROS:   Constitutional: Denies fevers or chills HEENT: Stable hearing and visual loss Respiratory: No breathing issues Cardiovascular: No chest pain no shortness of breath no orthopnea Abdomen: Denies abdominal pain normal stools Extremities: Continues to walk, well-healed amputation of the left and stable skin changes to right  Physical  Exam:  Awake alert and oriented Strong peroneal artery signal on the right and a PT signal at the level of the foot.  Toes appear per Dr. Amalia Hailey recent note that evidence of infection there is evidence of superficial bleeding  I have independently interpreted her right lower extremity duplex which demonstrates no hemodynamically significant plaque and a 30 to 49% stenosis but is not supported by 2D imaging.  Her ABI on the right is 0.93 and left 0.89.   Assessment/Plan:  This is a 84 y.o. female who is s/p: 1.Ultrasound-guided cannulation left common femoral artery. 2.Right lower extremity angiogram 3.Drug-coated balloon angioplasty right SFA and popliteal arteries with 5 mm balloon 4.3 mm plain balloon angioplasty of tibioperoneal trunk on right 5.Closure  of left common femoral artery for continuous access with minx device   She will continue to follow with Dr. Amalia Hailey.  She will follow-up with me or nurse practitioner in 4 to 5 months with repeat ABIs and duplex.   Brandon C. Donzetta Matters, MD [facility] Office: [phone] Pager: [phone]

## 2017-07-27 NOTE — Progress Notes (Signed)
Vitals:   07/27/17 1556  BP: (!) 193/81  Pulse: 76  Resp: 16  Temp: 97.7 F (36.5 C)  TempSrc: Oral  SpO2: 98%  Weight: 157 lb (71.2 kg)  Height: 5\' 6"  (1.676 m)

## 2017-07-27 NOTE — Telephone Encounter (Signed)
I informed Shannon [name] that [facility] had accepted pt and would call to set up visit.s

## 2017-07-27 NOTE — Telephone Encounter (Signed)
Barnabas Harries - [facility] states she has discussed with supervisors and they will accept pt into their care.

## 2017-07-31 ENCOUNTER — Ambulatory Visit (HOSPITAL_COMMUNITY)
Admission: RE | Admit: 2017-07-31 | Discharge: 2017-07-31 | Disposition: A | Discharge status: Home / Self Care | Payer: Medicare Other | Source: Ambulatory Visit | Attending: Radiology | Admitting: Radiology

## 2017-07-31 ENCOUNTER — Other Ambulatory Visit (HOSPITAL_COMMUNITY): Payer: Self-pay | Admitting: Radiology

## 2017-07-31 ENCOUNTER — Encounter: Payer: Self-pay | Admitting: Radiology

## 2017-07-31 DIAGNOSIS — R633 Feeding difficulties, unspecified: Secondary | ICD-10-CM

## 2017-07-31 HISTORY — PX: IR PATIENT EVAL TECH 0-60 MINS: IMG5564

## 2017-07-31 MED ORDER — SILVER NITRATE-POT NITRATE 75-25 % EX MISC
CUTANEOUS | Status: AC
  Filled 2017-07-31: qty 1

## 2017-07-31 MED ORDER — SILVER NITRATE-POT NITRATE 75-25 % EX MISC
CUTANEOUS | Status: AC
  Filled 2017-07-31: qty 2

## 2017-07-31 NOTE — Procedures (Signed)
Patient came in today with complaint of gj tube insertion site leaking "entire feedings" around the tube.  She also has significant pain in her lower right abdomen with her feedings.  She does have a large hernia that she describes as getting larger and changing sides.  I pulled the bumper back to evaluate the site.  There is some skin issues secondary to pressure of the bumper since she leans forward most of the time when sitting.  I advised her to try to sit more upright to relieve some of the pressure.  There was also significant granuloma tissue that was bleeding.  Rowe Robert PA evaluated the site with me and I applied silver nitrate to stop the bleeding from the granuloma tissue.  I flushed both ports to try to reproduce the leaking but was unable to.  The dressing she came in with did have a lot of gastric feedings so it does appear that it had leaked before.  The insertion site does look like it may need a larger catheter.  I asked if she wanted to wait until our scheduled outpatient prior to her was completed for [place] to possibly inject and / exchange the tube.  The delay would be about 2 hours. The current tube she had was special ordered for her and we did not have it in stock but I could get it tomorrow.  She opted not to stay and to wait for the new tube to come in.  She is scheduled to return in two days.  At that time the radiologist will determine if the tube does need to be bigger or if her pain is relieved by exchanging it with the new same catheter.  She had a similar problem with pain in the past and it was relieved by an exchange.  Her daughter will call us Thursday morning to let us know if they are still having problems with pain and leaking so we can proceed.

## 2017-08-01 ENCOUNTER — Other Ambulatory Visit (HOSPITAL_COMMUNITY): Payer: Self-pay | Admitting: Radiology

## 2017-08-01 ENCOUNTER — Other Ambulatory Visit: Payer: Medicare Other

## 2017-08-01 DIAGNOSIS — E785 Hyperlipidemia, unspecified: Secondary | ICD-10-CM

## 2017-08-01 DIAGNOSIS — D638 Anemia in other chronic diseases classified elsewhere: Secondary | ICD-10-CM | POA: Diagnosis not present

## 2017-08-01 DIAGNOSIS — E038 Other specified hypothyroidism: Secondary | ICD-10-CM | POA: Diagnosis not present

## 2017-08-01 DIAGNOSIS — I70209 Unspecified atherosclerosis of native arteries of extremities, unspecified extremity: Secondary | ICD-10-CM | POA: Diagnosis not present

## 2017-08-01 DIAGNOSIS — E11621 Type 2 diabetes mellitus with foot ulcer: Secondary | ICD-10-CM

## 2017-08-01 DIAGNOSIS — R633 Feeding difficulties, unspecified: Secondary | ICD-10-CM

## 2017-08-01 DIAGNOSIS — J449 Chronic obstructive pulmonary disease, unspecified: Secondary | ICD-10-CM | POA: Diagnosis not present

## 2017-08-01 DIAGNOSIS — Z48812 Encounter for surgical aftercare following surgery on the circulatory system: Secondary | ICD-10-CM | POA: Diagnosis not present

## 2017-08-01 DIAGNOSIS — E1151 Type 2 diabetes mellitus with diabetic peripheral angiopathy without gangrene: Secondary | ICD-10-CM | POA: Diagnosis not present

## 2017-08-01 DIAGNOSIS — I130 Hypertensive heart and chronic kidney disease with heart failure and stage 1 through stage 4 chronic kidney disease, or unspecified chronic kidney disease: Secondary | ICD-10-CM | POA: Diagnosis not present

## 2017-08-01 DIAGNOSIS — L97513 Non-pressure chronic ulcer of other part of right foot with necrosis of muscle: Secondary | ICD-10-CM | POA: Diagnosis not present

## 2017-08-01 DIAGNOSIS — I2510 Atherosclerotic heart disease of native coronary artery without angina pectoris: Secondary | ICD-10-CM | POA: Diagnosis not present

## 2017-08-01 LAB — CBC WITH DIFFERENTIAL/PLATELET
Basophils Absolute: 63 cells/uL (ref 0–200)
Basophils Relative: 1 %
Eosinophils Absolute: 403 cells/uL (ref 15–500)
Eosinophils Relative: 6.4 %
HCT: 24.3 % — ABNORMAL LOW (ref 35.0–45.0)
Hemoglobin: 7.9 g/dL — ABNORMAL LOW (ref 11.7–15.5)
Lymphs Abs: 1040 cells/uL (ref 850–3900)
MCH: 28.1 pg (ref 27.0–33.0)
MCHC: 32.5 g/dL (ref 32.0–36.0)
MCV: 86.5 fL (ref 80.0–100.0)
MPV: 11.3 fL (ref 7.5–12.5)
Monocytes Relative: 13 %
Neutro Abs: 3975 cells/uL (ref 1500–7800)
Neutrophils Relative %: 63.1 %
Platelets: 288 10*3/uL (ref 140–400)
RBC: 2.81 10*6/uL — ABNORMAL LOW (ref 3.80–5.10)
RDW: 14.6 % (ref 11.0–15.0)
Total Lymphocyte: 16.5 %
WBC mixed population: 819 cells/uL (ref 200–950)
WBC: 6.3 10*3/uL (ref 3.8–10.8)

## 2017-08-01 LAB — LIPID PANEL
Cholesterol: 97 mg/dL (ref <200)
HDL: 53 mg/dL (ref >50)
LDL Cholesterol (Calc): 23 mg/dL (calc)
Non-HDL Cholesterol (Calc): 44 mg/dL (calc) (ref <130)
Total CHOL/HDL Ratio: 1.8 (calc) (ref <5.0)
Triglycerides: 120 mg/dL (ref <150)

## 2017-08-01 LAB — COMPLETE METABOLIC PANEL WITH GFR
AG Ratio: 0.8 (calc) — ABNORMAL LOW (ref 1.0–2.5)
ALT: 14 U/L (ref 6–29)
AST: 20 U/L (ref 10–35)
Albumin: 3.6 g/dL (ref 3.6–5.1)
Alkaline phosphatase (APISO): 92 U/L (ref 33–130)
BUN/Creatinine Ratio: 42 (calc) — ABNORMAL HIGH (ref 6–22)
BUN: 65 mg/dL — ABNORMAL HIGH (ref 7–25)
CO2: 30 mmol/L (ref 20–32)
Calcium: 9.3 mg/dL (ref 8.6–10.4)
Chloride: 100 mmol/L (ref 98–110)
Creat: 1.53 mg/dL — ABNORMAL HIGH (ref 0.60–0.88)
GFR, Est African American: 34 mL/min/{1.73_m2} — ABNORMAL LOW (ref >=60)
GFR, Est Non African American: 29 mL/min/{1.73_m2} — ABNORMAL LOW (ref >=60)
Globulin: 4.4 g/dL (calc) — ABNORMAL HIGH (ref 1.9–3.7)
Glucose, Bld: 125 mg/dL — ABNORMAL HIGH (ref 65–99)
Potassium: 4.7 mmol/L (ref 3.5–5.3)
Sodium: 136 mmol/L (ref 135–146)
Total Bilirubin: 0.3 mg/dL (ref 0.2–1.2)
Total Protein: 8 g/dL (ref 6.1–8.1)

## 2017-08-01 LAB — TSH: TSH: 3.67 mIU/L (ref 0.40–4.50)

## 2017-08-02 ENCOUNTER — Other Ambulatory Visit: Payer: Self-pay

## 2017-08-02 ENCOUNTER — Telehealth: Payer: Self-pay | Admitting: *Deleted

## 2017-08-02 ENCOUNTER — Ambulatory Visit (HOSPITAL_COMMUNITY)
Admission: RE | Admit: 2017-08-02 | Discharge: 2017-08-02 | Disposition: A | Discharge status: Home / Self Care | Payer: Medicare Other | Source: Ambulatory Visit | Attending: Radiology | Admitting: Radiology

## 2017-08-02 ENCOUNTER — Encounter (HOSPITAL_COMMUNITY): Payer: Self-pay | Admitting: Interventional Radiology

## 2017-08-02 ENCOUNTER — Ambulatory Visit (INDEPENDENT_AMBULATORY_CARE_PROVIDER_SITE_OTHER): Payer: Medicare Other | Admitting: Nurse Practitioner

## 2017-08-02 VITALS — BP 132/72 | HR 73 | Temp 98.0°F | Ht 66.0 in | Wt 153.0 lb

## 2017-08-02 DIAGNOSIS — D638 Anemia in other chronic diseases classified elsewhere: Secondary | ICD-10-CM | POA: Diagnosis not present

## 2017-08-02 DIAGNOSIS — R633 Feeding difficulties, unspecified: Secondary | ICD-10-CM

## 2017-08-02 DIAGNOSIS — I4819 Other persistent atrial fibrillation: Secondary | ICD-10-CM

## 2017-08-02 DIAGNOSIS — K9423 Gastrostomy malfunction: Secondary | ICD-10-CM | POA: Diagnosis not present

## 2017-08-02 DIAGNOSIS — E785 Hyperlipidemia, unspecified: Secondary | ICD-10-CM

## 2017-08-02 DIAGNOSIS — I739 Peripheral vascular disease, unspecified: Secondary | ICD-10-CM

## 2017-08-02 DIAGNOSIS — E038 Other specified hypothyroidism: Secondary | ICD-10-CM | POA: Diagnosis not present

## 2017-08-02 DIAGNOSIS — I70262 Atherosclerosis of native arteries of extremities with gangrene, left leg: Secondary | ICD-10-CM

## 2017-08-02 DIAGNOSIS — E1142 Type 2 diabetes mellitus with diabetic polyneuropathy: Secondary | ICD-10-CM

## 2017-08-02 DIAGNOSIS — N183 Chronic kidney disease, stage 3 unspecified: Secondary | ICD-10-CM

## 2017-08-02 DIAGNOSIS — E119 Type 2 diabetes mellitus without complications: Secondary | ICD-10-CM | POA: Diagnosis not present

## 2017-08-02 DIAGNOSIS — I481 Persistent atrial fibrillation: Secondary | ICD-10-CM | POA: Diagnosis not present

## 2017-08-02 DIAGNOSIS — K9413 Enterostomy malfunction: Secondary | ICD-10-CM | POA: Diagnosis not present

## 2017-08-02 HISTORY — PX: IR GJ TUBE CHANGE: IMG1440

## 2017-08-02 LAB — HEMOGLOBIN A1C
Hgb A1c MFr Bld: 7 % of total Hgb — ABNORMAL HIGH (ref <5.7)
Mean Plasma Glucose: 154 (calc)
eAG (mmol/L): 8.5 (calc)

## 2017-08-02 MED ORDER — SILVER NITRATE-POT NITRATE 75-25 % EX MISC
CUTANEOUS | Status: AC
  Filled 2017-08-02: qty 1

## 2017-08-02 MED ORDER — IOPAMIDOL (ISOVUE-300) INJECTION 61%
INTRAVENOUS | Status: AC
  Administered 2017-08-02: 35 mL
  Filled 2017-08-02: qty 50

## 2017-08-02 NOTE — Progress Notes (Signed)
Careteam: Patient Care Team: Gildardo Cranker, DO as PCP - General (Internal Medicine) Edrick Kins, DPM as Consulting Physician (Podiatry) Dorothy Spark, MD as Consulting Physician (Cardiology)  Advanced Directive information Does Patient Have a Medical Advance Directive?: Yes, Type of Advance Directive: [place], Does patient want to make changes to medical advance directive?: No - Patient declined  Allergies  Allergen Reactions  . Penicillins Itching, Rash and Other (See Comments)    Tolerated unasyn, zosyn & cephalosporins in the past.  Has patient had a PCN reaction causing immediate rash, facial/tongue/throat swelling, SOB or lightheadedness with hypotension: Yes Has patient had a PCN reaction causing severe rash involving mucus membranes or skin necrosis: Unk Has patient had a PCN reaction that required hospitalization: Unk Has patient had a PCN reaction occurring within the last 10 years: Unk If all of the above answers are "NO", then may proceed with Cephalosporins  . Other     Patient tolerates amoxicillin and cephalosporins    Chief Complaint  Patient presents with  . Medical Management of Chronic Issues    Pt is being seen for a routine follow up.   . Other    Daughter in room     HPI: Patient is a 84 y.o. female seen in the office today for routine follow up.  Just got finished with radiologist who changed PEG because PEG tube was leaking/draining then started bleeding. She had also been complaining of her stomach being sore. Apparently tube had moved away from her stomach which was causing pain. Hopefully after exchange today this will help with pain.   Continues to follow up with vascular  s/p RLE angiogram,  drug coated balloon angioplasty right SFA and popliteal arteries with 31mm balloon, 86mm plain balloon angioplasty of tibioperoneal trunk on right and closure of left common femoral artery for continuous access with minx device on  06/27/17. she is also following with Dr Amalia Hailey for debridement of her toes. [facility] was supposed to be coming to help with dressing changes but daughter reports they have not, she plans to call Dr Amalia Hailey office to get instructions for dressing so she can just do it herself. Next follow up with Dr Amalia Hailey on 08/02/17.  Overall right leg pain and swelling has improved.   Continues to go to anticoagulation clinic for INR check on coumadin.   Fasting Blood sugars have been elevated- 200-290 but then around lunch and super normalized. Happened over the last 2 weeks. A1c was 7.0 on labs earlier this week.  Review of Systems:  Review of Systems  Constitutional: Negative for chills, fever, malaise/fatigue and weight loss.  Respiratory: Negative for cough, sputum production and shortness of breath.   Cardiovascular: Positive for leg swelling (improved). Negative for chest pain and palpitations.  Gastrointestinal: Negative for abdominal pain, constipation, diarrhea and heartburn.  Genitourinary: Negative for dysuria, frequency and urgency.  Musculoskeletal: Positive for joint pain. Negative for back pain, falls, myalgias and neck pain.  Skin:       Dressing to right foot  Neurological: Negative for dizziness and headaches.  Psychiatric/Behavioral: Negative for depression and memory loss. The patient does not have insomnia.     Past Medical History:  Diagnosis Date  . Acute respiratory failure ([place])   . Anemia    previous blood transfusions  . Arthritis    "all over"  . Asthma   . Bradycardia    requiring previous d/c of BB and reduction of amiodarone  .  CAD (coronary artery disease)    nonobstructive per notes  . Chronic diastolic CHF (congestive heart failure) ([place])   . CKD (chronic kidney disease), stage III ([place])   . Complication of blood transfusion    "got the wrong blood type at [facility] in ~ 2015; no adverse reaction that we are aware of"/daughter, [name] (01/27/2016)  . COPD  (chronic obstructive pulmonary disease) ([place])   . Depression    "light case"  . DVT (deep venous thrombosis) ([place]) 01/2016   a. LLE DVT 01/2016 - switched from Eliquis to Coumadin.  . Gastric stenosis    a. s/p stomach tube  . GERD (gastroesophageal reflux disease)   . History of blood transfusion    "several" (01/27/2016)  . History of stomach ulcers   . Hyperlipidemia   . Hypertension   . Hypothyroidism   . On home oxygen therapy    "2L; 9PM til 9AM" (06/27/2017)  . Paraesophageal hernia   . Perforated gastric ulcer ([place])   . Pneumonia    "a few times" (06/27/2017)  . Seasonal allergies   . SIADH (syndrome of inappropriate ADH production) ([place])    [name] 01/10/2015  . Small bowel obstruction ([place])    "I don't know how many" (01/11/2015)  . Stroke ([place])    "light one"  . Type II diabetes mellitus ([place])    "related to prednisone use  for > 20 yr; once predinose stopped; no more DM RX" (01/27/2016)  . UTI (urinary tract infection) 02/08/2016  . Ventral hernia with bowel obstruction    Past Surgical History:  Procedure Laterality Date  . ABDOMINAL AORTOGRAM N/A 09/21/2016   Procedure: Abdominal Aortogram;  Surgeon: Waynetta Sandy, MD;  Location: [place] [facility];  Service: Cardiovascular;  Laterality: N/A;  . AMPUTATION Left 09/25/2016   Procedure: AMPUTATION DIGIT- LEFT 2ND AND 3RD TOES;  Surgeon: Rosetta Posner, MD;  Location: [place];  Service: Vascular;  Laterality: Left;  . CATARACT EXTRACTION W/ INTRAOCULAR LENS  IMPLANT, BILATERAL    . CHOLECYSTECTOMY OPEN    . COLECTOMY    . ESOPHAGOGASTRODUODENOSCOPY N/A 01/19/2014   Procedure: ESOPHAGOGASTRODUODENOSCOPY (EGD);  Surgeon: Irene Shipper, MD;  Location: [facility];  Service: Endoscopy;  Laterality: N/A;  . ESOPHAGOGASTRODUODENOSCOPY N/A 01/20/2014   Procedure: ESOPHAGOGASTRODUODENOSCOPY (EGD);  Surgeon: Irene Shipper, MD;  Location: [facility];  Service: Endoscopy;  Laterality: N/A;  . ESOPHAGOGASTRODUODENOSCOPY  N/A 03/19/2014   Procedure: ESOPHAGOGASTRODUODENOSCOPY (EGD);  Surgeon: Milus Banister, MD;  Location: [facility];  Service: Endoscopy;  Laterality: N/A;  . ESOPHAGOGASTRODUODENOSCOPY N/A 07/08/2015   Procedure: ESOPHAGOGASTRODUODENOSCOPY (EGD);  Surgeon: Doran Stabler, MD;  Location: [facility];  Service: Endoscopy;  Laterality: N/A;  . ESOPHAGOGASTRODUODENOSCOPY (EGD) WITH PROPOFOL N/A 09/15/2015   Procedure: ESOPHAGOGASTRODUODENOSCOPY (EGD) WITH PROPOFOL;  Surgeon: Manus Gunning, MD;  [facility]: [facility];  Service: Gastroenterology;  Laterality: N/A;  . GASTROJEJUNOSTOMY     hx/notes 01/10/2015  . GASTROJEJUNOSTOMY N/A 09/23/2015   Procedure: OPEN GASTROJEJUNOSTOMY TUBE PLACEMENT;  Surgeon: Arta Bruce Kinsinger, MD;  Location: [facility];  Service: General;  Laterality: N/A;  . GLAUCOMA SURGERY Bilateral   . HERNIA REPAIR  2015  . IR CM INJ ANY COLONIC TUBE W/FLUORO  01/05/2017  . IR CM INJ ANY COLONIC TUBE W/FLUORO  06/07/2017  . IR GENERIC HISTORICAL  01/07/2016   IR GJ TUBE CHANGE 01/07/2016 Jacqulynn Cadet, MD [facility]  . IR GENERIC HISTORICAL  01/27/2016   IR MECH REMOV OBSTRUC MAT  ANY COLON TUBE W/FLUORO 01/27/2016 Markus Daft, MD [facility]  . IR GENERIC HISTORICAL  02/07/2016   IR PATIENT EVAL TECH 0-60 MINS Darrell K Allred, PA-C [facility]  . IR GENERIC HISTORICAL  02/08/2016   IR GJ TUBE CHANGE 02/08/2016 Greggory Keen, MD [facility]  . IR GENERIC HISTORICAL  01/06/2016   IR GJ TUBE CHANGE 01/06/2016 [facility] OUT REF  . IR GENERIC HISTORICAL  05/02/2016   IR CM INJ ANY COLONIC TUBE W/FLUORO 05/02/2016 Arne Cleveland, MD [facility]  . IR GENERIC HISTORICAL  05/15/2016   IR GJ TUBE CHANGE 05/15/2016 Sandi Mariscal, MD [facility]  . IR GENERIC HISTORICAL  06/28/2016   IR GJ TUBE CHANGE 06/28/2016 [facility]  . IR GJ TUBE CHANGE  02/20/2017  . IR GJ TUBE CHANGE  05/10/2017  . IR GJ TUBE CHANGE  07/06/2017  . IR GJ TUBE CHANGE  08/02/2017  . IR PATIENT EVAL TECH 0-60  MINS  10/19/2016  . IR PATIENT EVAL TECH 0-60 MINS  12/25/2016  . IR PATIENT EVAL TECH 0-60 MINS  01/29/2017  . IR PATIENT EVAL TECH 0-60 MINS  04/04/2017  . IR PATIENT EVAL TECH 0-60 MINS  04/30/2017  . IR PATIENT EVAL TECH 0-60 MINS  07/31/2017  . IR REPLC DUODEN/JEJUNO TUBE PERCUT W/FLUORO  11/14/2016  . LAPAROTOMY N/A 01/20/2015   Procedure: EXPLORATORY LAPAROTOMY;  Surgeon: Coralie Keens, MD;  Location: [place];  Service: General;  Laterality: N/A;  . LOWER EXTREMITY ANGIOGRAPHY Left 09/21/2016   Procedure: Lower Extremity Angiography;  Surgeon: Waynetta Sandy, MD;  Location: [place] [facility];  Service: Cardiovascular;  Laterality: Left;  . LOWER EXTREMITY ANGIOGRAPHY Right 06/27/2017   Procedure: Lower Extremity Angiography;  Surgeon: Waynetta Sandy, MD;  Location: [place] [facility];  Service: Cardiovascular;  Laterality: Right;  . LYSIS OF ADHESION N/A 01/20/2015   Procedure: LYSIS OF ADHESIONS < 1 HOUR;  Surgeon: Coralie Keens, MD;  Location: [place];  Service: General;  Laterality: N/A;  . PERIPHERAL VASCULAR BALLOON ANGIOPLASTY Left 09/21/2016   Procedure: Peripheral Vascular Balloon Angioplasty;  Surgeon: Waynetta Sandy, MD;  Location: [place] [facility];  Service: Cardiovascular;  Laterality: Left;  drug coated balloon  . PERIPHERAL VASCULAR BALLOON ANGIOPLASTY Right 06/27/2017   Procedure: PERIPHERAL VASCULAR BALLOON ANGIOPLASTY;  Surgeon: Waynetta Sandy, MD;  Location: [place] [facility];  Service: Cardiovascular;  Laterality: Right;  SFA/TPTRUNK  . TONSILLECTOMY    . TUBAL LIGATION    . VENTRAL HERNIA REPAIR  2015   incarcerated ventral hernia ([facility] 09/2013)/notes 01/10/2015   Social History:   reports that she has never smoked. She has quit using smokeless tobacco. Her smokeless tobacco use included snuff. She reports that she does not drink alcohol or use drugs.  Family History  Problem Relation Age of Onset  . Stroke Mother   .  Hypertension Mother   . Diabetes Brother   . Glaucoma Son   . Glaucoma Son   . Heart attack Neg Hx   . Colon cancer Neg Hx   . Stomach cancer Neg Hx     Medications: Patient's Medications  New Prescriptions   No medications on file  Previous Medications   ACETAMINOPHEN (TYLENOL) 500 MG TABLET    Take 1,000 mg by mouth every 6 (six) hours as needed (pain).   AMINO ACIDS-PROTEIN HYDROLYS PO    Take 30 mLs by mouth 2 (two) times daily.   ATORVASTATIN (LIPITOR) 10 MG TABLET  TAKE 1 TABLET BY MOUTH EVERY DAY   B COMPLEX VITAMINS TABLET    Take 1 tablet by mouth daily.    CALCIUM-VITAMIN D (OSCAL WITH D) 500-200 MG-UNIT PER TABLET    Take 1 tablet by mouth daily with breakfast.    CARVEDILOL (COREG) 6.25 MG TABLET    Take 6.25 mg by mouth 2 (two) times daily.   CLOPIDOGREL (PLAVIX) 75 MG TABLET    Take 1 tablet (75 mg total) by mouth daily with breakfast.   COLLAGENASE (SANTYL) OINTMENT    Apply 1 application topically daily.   CYCLOSPORINE (RESTASIS) 0.05 % OPHTHALMIC EMULSION    USE 1 DROP INTO BOTH EYES TWICE DAILY   FEEDING SUPPLEMENT (OSMOLITE 1 CAL) LIQD    Take 711 mLs by mouth at bedtime.    FERROUS SULFATE 325 (65 FE) MG TABLET    Take one tablet by mouth once daily with breakfast   FLUTICASONE (FLONASE) 50 MCG/ACT NASAL SPRAY    Place 2 sprays into both nostrils daily.   FUROSEMIDE (LASIX) 40 MG TABLET    Take 1 tablet (40 mg total) by mouth daily.   GABAPENTIN (NEURONTIN) 300 MG CAPSULE    TAKE 2 CAPSULES (600 MG TOTAL) BY MOUTH 3 (THREE) TIMES DAILY.   GENTAMICIN CREAM (GARAMYCIN) 0.1 %    Apply 1 application topically 3 (three) times daily.   HYDRALAZINE (APRESOLINE) 50 MG TABLET    Take 50 mg by mouth 3 (three) times daily.   INSULIN ASPART (NOVOLOG FLEXPEN) 100 UNIT/ML FLEXPEN    Take three times daily before each meal based on sliding scale 140-199( 2 units), 200-250 (4 units), 251-299 (6 units), 300-349 (8 units), > 350(10 units)   INSULIN GLARGINE (LANTUS) 100 UNIT/ML  SOLOSTAR PEN    Injection 0.1 ml (10 units) once daily to control blood sugar. Dx E11.9   INSULIN PEN NEEDLE (B-D UF III MINI PEN NEEDLES) 31G X 5 MM MISC    Use twice daily with giving insulin injections. Dx E11.9   IPRATROPIUM-ALBUTEROL (DUONEB) 0.5-2.5 (3) MG/3ML SOLN    Take 3 mLs by nebulization every 6 (six) hours as needed (sob). Use 3 times daily x 4 days then every 6 hours as needed.   ISOSORBIDE MONONITRATE (IMDUR) 30 MG 24 HR TABLET    Take 2 tablets (60 mg total) by mouth daily.   LATANOPROST (XALATAN) 0.005 % OPHTHALMIC SOLUTION    Place 1 drop into both eyes at bedtime.   LEVALBUTEROL (XOPENEX HFA) 45 MCG/ACT INHALER    Inhale 2 puffs into the lungs every 6 (six) hours as needed for wheezing or shortness of breath.   LEVOTHYROXINE (SYNTHROID, LEVOTHROID) 137 MCG TABLET    TAKE 1 TABLET (137 MCG TOTAL) BY MOUTH DAILY BEFORE BREAKFAST.   MALTODEXTRIN-XANTHAN GUM (RESOURCE THICKENUP CLEAR) POWD    Take 120 g by mouth as needed (use to thicken liquids.).   MOMETASONE-FORMOTEROL (DULERA) 200-5 MCG/ACT AERO    Inhale 2 puffs into the lungs 2 (two) times daily.   MULTIPLE VITAMIN (MULTIVITAMIN WITH MINERALS) TABS TABLET    Take 1 tablet by mouth daily.   NYSTATIN (MYCOSTATIN/NYSTOP) POWDER    Apply topically 3 (three) times daily.   OXYCODONE (ROXICODONE) 5 MG IMMEDIATE RELEASE TABLET    Take 1 tablet (5 mg total) by mouth every 4 (four) hours as needed for severe pain. May take 1/2 tab q4hrs as needed for mild pain.   OXYGEN    Inhale 2 L into the lungs at  bedtime.   PANTOPRAZOLE (PROTONIX) 40 MG TABLET    Take 1 tablet (40 mg total) by mouth 2 (two) times daily.   POLYETHYL GLYCOL-PROPYL GLYCOL 0.4-0.3 % SOLN    Place 1 drop into both eyes 4 (four) times daily.    SERTRALINE (ZOLOFT) 100 MG TABLET    TAKE ONE TABLET BY MOUTH ONCE DAILY FOR DEPRESSION   WARFARIN (COUMADIN) 2.5 MG TABLET    TAKE AS DIRECTED BY COUMADIN CLINIC.   WATER FOR IRRIGATION, STERILE (FREE WATER) SOLN    Place 60 mLs  into feeding tube 3 (three) times daily.    WHITE PETROLATUM-MINERAL OIL (SYSTANE NIGHTTIME) OINT    Apply 1 application to eye at bedtime. Both eyes  Modified Medications   No medications on file  Discontinued Medications   ENOXAPARIN (LOVENOX) 60 MG/0.6ML INJECTION    Inject 0.6 mLs (60 mg total) into the skin daily.   HYDRALAZINE (APRESOLINE) 50 MG TABLET    Take 1 tablet (50 mg total) by mouth 3 (three) times daily.     Physical Exam:  Vitals:   08/02/17 1347  BP: 132/72  Pulse: 73  Temp: 98 F (36.7 C)  TempSrc: Oral  SpO2: 99%  Weight: 153 lb (69.4 kg)  Height: 5\' 6"  (1.676 m)   Body mass index is 24.69 kg/m.  Physical Exam  Constitutional: She is oriented to person, place, and time. She appears well-developed and well-nourished.  HENT:  Head: Normocephalic and atraumatic.  Eyes: Pupils are equal, round, and reactive to light.  Neck: Normal range of motion. Neck supple.  Cardiovascular: Normal rate and regular rhythm.  Murmur heard. Pulmonary/Chest: Effort normal and breath sounds normal.  Abdominal: Soft. Bowel sounds are normal.  Musculoskeletal: She exhibits edema (1+bilaterally).  Neurological: She is alert and oriented to person, place, and time.  Skin: Skin is warm and dry. No erythema.  Right toes covered in bandage, CDI  Psychiatric: She has a normal mood and affect.    Labs reviewed: Basic Metabolic Panel: Recent Labs    08/16/16 0616  08/18/16 0517  09/25/16 0524  02/22/17 0436 02/23/17 0330 06/06/17 1755 06/27/17 0937 06/28/17 0324 08/01/17 1039  NA 141   < > 135   < > 136   < > 141 137 139 137 135 136  K 4.2   < > 4.0   < > 4.6   < > 4.2 4.1 4.1 5.8* 4.1 4.7  CL 107   < > 101   < > 105   < > 114* 111 102 101 101 100  CO2 25   < > 24   < > 25   < > 22 22 28   --  25 30  GLUCOSE 350*   < > 154*   < > 106*   < > 146* 142* 103* 121* 263* 125*  BUN 54*   < > 49*   < > 32*   < > 32* 24* 73* 75* 48* 65*  CREATININE 1.04*   < > 0.96   < > 1.03*    < > 1.03* 0.99 1.67* 1.60* 1.41* 1.53*  CALCIUM 9.4   < > 9.1   < > 8.7*   < > 8.9 9.0 9.5  --  8.8* 9.3  MG 1.9  --  1.9  --   --   --  1.9 1.6*  --   --   --   --   PHOS 2.2*  --   --   --  3.5  --   --  2.1*  --   --   --   --   TSH  --   --  0.670  --   --   --   --   --   --   --   --  3.67   < > = values in this interval not displayed.   Liver Function Tests: Recent Labs    09/26/16 0435 10/03/16 1610 11/02/16 1710 08/01/17 1039  AST 25 20 24 20   ALT 31 25 19 14   ALKPHOS 57 73 58  --   BILITOT 0.4 0.4 0.5 0.3  PROT 5.4* 7.1 7.0 8.0  ALBUMIN 2.0* 3.2* 3.5  --    No results for input(s): LIPASE, AMYLASE in the last 8760 hours. No results for input(s): AMMONIA in the last 8760 hours. CBC: Recent Labs    02/22/17 0436 02/23/17 0330 06/06/17 1755 06/27/17 0937 06/28/17 0324 08/01/17 1039  WBC 6.3 7.4 7.0  --  6.9 6.3  NEUTROABS 3.3 4.4  --   --   --  3,975  HGB 8.9* 9.3* 9.5* 9.5* 7.8* 7.9*  HCT 26.9* 27.8* 29.0* 28.0* 23.8* 24.3*  MCV 93.4 91.7 90.9  --  86.9 86.5  PLT 238 250 282  --  247 288   Lipid Panel: Recent Labs    08/01/17 1039  CHOL 97  HDL 53  LDLCALC 23  TRIG 120  CHOLHDL 1.8   TSH: Recent Labs    08/18/16 0517 08/01/17 1039  TSH 0.670 3.67   A1C: Lab Results  Component Value Date   HGBA1C 7.0 (H) 08/01/2017     Assessment/Plan 1. Anemia of chronic disease -unchanged, continues on iron supplement and will follow up lab in 8 week  - CBC with Differential/Platelets; Future  2. Persistent atrial fibrillation (HCC) Rate controlled on current regimen, continue on coumadin for anticoagulation   3. PAD (peripheral artery disease) (HCC) Continues to follow up with vascular and podiatry S/p RLE arteriogram with [facility] angioplasty of R SFA and plain balloon angioplasty of tibioperoneal trunk. Continues on plavix and coumadin  4. Chronic kidney insufficiency, stage 3 (moderate) (HCC) Stable, continue to stay hydrated and avoid NSAIDs   5.  Dyslipidemia -LDL at goal on atorvastatin 10 mg daily   6. Other specified hypothyroidism TSH at goal, continue synthroid 137 mcg daily   7. Diabetic polyneuropathy associated with type 2 diabetes mellitus (HCC) Stable, continues on gabapentin 300 mg 2 tablets TID  8. Diabetes mellitus type 2 in nonobese (HCC) -A1c at goal, daughter reports elevated AM fasting blood sugars but throughout the day improve, will continue current regimen and monitor.   Next appt: 4 months with Dr Thayer Headings K. [place], [facility] [phone]

## 2017-08-02 NOTE — Telephone Encounter (Signed)
Stacy with [facility] called requesting verbal orders to continue PT. Verbal order given.

## 2017-08-03 ENCOUNTER — Telehealth: Payer: Self-pay | Admitting: Podiatry

## 2017-08-03 NOTE — Telephone Encounter (Signed)
Pt's dtr, [name] states [place] has not contacted them. I told her I would call [facility] representative and have her contact her.

## 2017-08-03 NOTE — Telephone Encounter (Signed)
This is Jobe Gibbon, a PT with [facility]. Calling for a follow up with Dr. Amalia Hailey in regards to Shannon [name]. I need some clarification on the wound care, changes and orders that you would like [place] all to provide. I also need verbal orders for nursing to do those wound care changes for the pt in the home. If you could call me back [phone]. Thank you.

## 2017-08-03 NOTE — Telephone Encounter (Signed)
I spoke with [employer] that pt had not been contacted and she states she will get their company to contact dtr today and I gave [name]'s phone number.

## 2017-08-03 NOTE — Telephone Encounter (Addendum)
I informed Stacy, PT - [facility] that Dr. Amalia Hailey wanted to continue the betadine to the wounds of the right foot, instruct family and continue the skilled nursing once weekly. Marzetta Board states family wants to learn the dressing changes.

## 2017-08-03 NOTE — Telephone Encounter (Signed)
Patient's daughter wants to speak to the nurse Marcy Siren about the referral that was sent out for her mother Shannon [name]. If you can call her back at [phone]

## 2017-08-06 ENCOUNTER — Ambulatory Visit (INDEPENDENT_AMBULATORY_CARE_PROVIDER_SITE_OTHER): Payer: Medicare Other | Admitting: Podiatry

## 2017-08-06 DIAGNOSIS — I70235 Atherosclerosis of native arteries of right leg with ulceration of other part of foot: Secondary | ICD-10-CM

## 2017-08-06 DIAGNOSIS — L97512 Non-pressure chronic ulcer of other part of right foot with fat layer exposed: Secondary | ICD-10-CM | POA: Diagnosis not present

## 2017-08-06 DIAGNOSIS — E0859 Diabetes mellitus due to underlying condition with other circulatory complications: Secondary | ICD-10-CM

## 2017-08-07 ENCOUNTER — Other Ambulatory Visit: Payer: Medicare Other

## 2017-08-07 ENCOUNTER — Telehealth: Payer: Self-pay | Admitting: Podiatry

## 2017-08-07 DIAGNOSIS — I2510 Atherosclerotic heart disease of native coronary artery without angina pectoris: Secondary | ICD-10-CM | POA: Diagnosis not present

## 2017-08-07 DIAGNOSIS — I130 Hypertensive heart and chronic kidney disease with heart failure and stage 1 through stage 4 chronic kidney disease, or unspecified chronic kidney disease: Secondary | ICD-10-CM | POA: Diagnosis not present

## 2017-08-07 DIAGNOSIS — I10 Essential (primary) hypertension: Secondary | ICD-10-CM | POA: Diagnosis not present

## 2017-08-07 DIAGNOSIS — I70209 Unspecified atherosclerosis of native arteries of extremities, unspecified extremity: Secondary | ICD-10-CM | POA: Diagnosis not present

## 2017-08-07 DIAGNOSIS — J449 Chronic obstructive pulmonary disease, unspecified: Secondary | ICD-10-CM | POA: Diagnosis not present

## 2017-08-07 DIAGNOSIS — E1151 Type 2 diabetes mellitus with diabetic peripheral angiopathy without gangrene: Secondary | ICD-10-CM | POA: Diagnosis not present

## 2017-08-07 DIAGNOSIS — I119 Hypertensive heart disease without heart failure: Secondary | ICD-10-CM | POA: Diagnosis not present

## 2017-08-07 DIAGNOSIS — Z48812 Encounter for surgical aftercare following surgery on the circulatory system: Secondary | ICD-10-CM | POA: Diagnosis not present

## 2017-08-07 NOTE — Telephone Encounter (Signed)
This is Shannon [name], Therapist, sports with [facility]. I'm calling to get nursing orders to come out and see her twice this week, and then once a week for the wound monitoring on her feet. If you could give me a call back at [phone]. I would also like to get clarification on how Dr. Amalia Hailey wants [place] to wrap this in regards to soaking her feet. Thank you and have a great day.

## 2017-08-08 ENCOUNTER — Other Ambulatory Visit: Payer: Medicare Other | Admitting: Internal Medicine

## 2017-08-08 ENCOUNTER — Encounter: Payer: Self-pay | Admitting: Pulmonary Disease

## 2017-08-08 ENCOUNTER — Ambulatory Visit (INDEPENDENT_AMBULATORY_CARE_PROVIDER_SITE_OTHER): Payer: Medicare Other | Admitting: Pulmonary Disease

## 2017-08-08 VITALS — BP 124/72 | HR 78 | Ht 66.0 in | Wt 157.0 lb

## 2017-08-08 DIAGNOSIS — E1151 Type 2 diabetes mellitus with diabetic peripheral angiopathy without gangrene: Secondary | ICD-10-CM | POA: Diagnosis not present

## 2017-08-08 DIAGNOSIS — I70262 Atherosclerosis of native arteries of extremities with gangrene, left leg: Secondary | ICD-10-CM | POA: Diagnosis not present

## 2017-08-08 DIAGNOSIS — Z48812 Encounter for surgical aftercare following surgery on the circulatory system: Secondary | ICD-10-CM | POA: Diagnosis not present

## 2017-08-08 DIAGNOSIS — J4530 Mild persistent asthma, uncomplicated: Secondary | ICD-10-CM | POA: Diagnosis not present

## 2017-08-08 DIAGNOSIS — I2510 Atherosclerotic heart disease of native coronary artery without angina pectoris: Secondary | ICD-10-CM | POA: Diagnosis not present

## 2017-08-08 DIAGNOSIS — J449 Chronic obstructive pulmonary disease, unspecified: Secondary | ICD-10-CM | POA: Diagnosis not present

## 2017-08-08 DIAGNOSIS — I130 Hypertensive heart and chronic kidney disease with heart failure and stage 1 through stage 4 chronic kidney disease, or unspecified chronic kidney disease: Secondary | ICD-10-CM | POA: Diagnosis not present

## 2017-08-08 DIAGNOSIS — I70209 Unspecified atherosclerosis of native arteries of extremities, unspecified extremity: Secondary | ICD-10-CM | POA: Diagnosis not present

## 2017-08-08 LAB — CBC WITH DIFFERENTIAL/PLATELET
Basophils Absolute: 0.1 10*3/uL (ref 0.0–0.2)
Basos: 1 %
EOS (ABSOLUTE): 0.4 10*3/uL (ref 0.0–0.4)
Eos: 8 %
Hematocrit: 26.9 % — ABNORMAL LOW (ref 34.0–46.6)
Hemoglobin: 8.6 g/dL — ABNORMAL LOW (ref 11.1–15.9)
Immature Grans (Abs): 0 10*3/uL (ref 0.0–0.1)
Immature Granulocytes: 0 %
Lymphocytes Absolute: 1.3 10*3/uL (ref 0.7–3.1)
Lymphs: 22 %
MCH: 28 pg (ref 26.6–33.0)
MCHC: 32 g/dL (ref 31.5–35.7)
MCV: 88 fL (ref 79–97)
Monocytes Absolute: 0.7 10*3/uL (ref 0.1–0.9)
Monocytes: 13 %
Neutrophils Absolute: 3.3 10*3/uL (ref 1.4–7.0)
Neutrophils: 56 %
Platelets: 331 10*3/uL (ref 150–379)
RBC: 3.07 x10E6/uL — ABNORMAL LOW (ref 3.77–5.28)
RDW: 16.5 % — ABNORMAL HIGH (ref 12.3–15.4)
WBC: 5.8 10*3/uL (ref 3.4–10.8)

## 2017-08-08 LAB — COMPREHENSIVE METABOLIC PANEL
ALT: 15 IU/L (ref 0–32)
AST: 22 IU/L (ref 0–40)
Albumin/Globulin Ratio: 1 — ABNORMAL LOW (ref 1.2–2.2)
Albumin: 4.1 g/dL (ref 3.2–4.6)
Alkaline Phosphatase: 99 IU/L (ref 39–117)
BUN/Creatinine Ratio: 39 — ABNORMAL HIGH (ref 12–28)
BUN: 52 mg/dL — ABNORMAL HIGH (ref 10–36)
Bilirubin Total: 0.2 mg/dL (ref 0.0–1.2)
CO2: 25 mmol/L (ref 20–29)
Calcium: 9.2 mg/dL (ref 8.7–10.3)
Chloride: 98 mmol/L (ref 96–106)
Creatinine, Ser: 1.33 mg/dL — ABNORMAL HIGH (ref 0.57–1.00)
GFR calc Af Amer: 40 mL/min/{1.73_m2} — ABNORMAL LOW (ref >59)
GFR calc non Af Amer: 35 mL/min/{1.73_m2} — ABNORMAL LOW (ref >59)
Globulin, Total: 4 g/dL (ref 1.5–4.5)
Glucose: 70 mg/dL (ref 65–99)
Potassium: 4.7 mmol/L (ref 3.5–5.2)
Sodium: 136 mmol/L (ref 134–144)
Total Protein: 8.1 g/dL (ref 6.0–8.5)

## 2017-08-08 LAB — TSH: TSH: 4.88 u[IU]/mL — ABNORMAL HIGH (ref 0.450–4.500)

## 2017-08-08 NOTE — Patient Instructions (Signed)
I am glad that your breathing is stable Continue using the [facility] as prescribed I will see you back in clinic in 1 year.  Please call us sooner if there is any change in your symptoms.

## 2017-08-08 NOTE — Progress Notes (Signed)
Shannon [name]    [id number]    [birth year]  Primary Care Physician:Carter, Maxine Glenn, DO  Referring Physician: Kirt Boys, DO [address] [place], [place] [address]  Chief complaint:   Follow up  Mild persistent asthma, eosinophilic inflammation  HPI: Shannon [name] is a 84 year old with complicated medical history including heart failure, chronic kidney disease, diabetes, dysphagia with recurrent aspiration status post PEG tube placement, gastric outlet obstruction. She has been hospitalized multiple times over the past yearwith bradycardia, weakness, rapid atrial flutter, left lower extremity DVT,sepsisdue to UTI. Her last admission was in December 2017 for acute on chronic heart failure. At that time palliative care was consulted. She was discharged home with a referral for home palliative care. She is currently DNR/DNI.   She returns to clinic today with chief complaints of dyspnea on exertion with occasional wheeze. She has occasional cough with mucus production. He denies any fevers, chills. She feels that she is slowly improving. She is currently maintained on DuoNeb which she takes twice daily. She is also on Xopenex neb PRN. She has been prescribed Advair but is not taking it on a regular basis. She is a never smoker with no known exposures.  She was hospitalized earlier this month for symptomatic bradycardia, hypotension secondary to beta blocker and calcium channel blocker. She had to be treated with dopamine and glucagon drip. She was evaluated by cardiology but declined a pacemaker. Her stay was complicated by hospital-acquired pneumonia. Also noted to have 1 out of 2 bottles growing Candida. This was thought to be a contaminant but she was started on Diflucan. Repeat cultures are negative.  Interim history:  Continues on Dulera.  States that breathing is doing well with no new issues.  Outpatient Encounter Medications as of 08/08/2017  Medication Sig  . acetaminophen  (TYLENOL) 500 MG tablet Take 1,000 mg by mouth every 6 (six) hours as needed (pain).  . AMINO ACIDS-PROTEIN HYDROLYS PO Take 30 mLs by mouth 2 (two) times daily.  [name] atorvastatin (LIPITOR) 10 MG tablet TAKE 1 TABLET BY MOUTH EVERY DAY  . b complex vitamins tablet Take 1 tablet by mouth daily.   . calcium-vitamin D (OSCAL WITH D) 500-200 MG-UNIT per tablet Take 1 tablet by mouth daily with breakfast.   . carvedilol (COREG) 6.25 MG tablet Take 6.25 mg by mouth 2 (two) times daily.  . clopidogrel (PLAVIX) 75 MG tablet Take 1 tablet (75 mg total) by mouth daily with breakfast.  . collagenase (SANTYL) ointment Apply 1 application topically daily.  . cycloSPORINE (RESTASIS) 0.05 % ophthalmic emulsion USE 1 DROP INTO BOTH EYES TWICE DAILY  . feeding supplement (OSMOLITE 1 CAL) LIQD Take 711 mLs by mouth at bedtime.   . ferrous sulfate 325 (65 FE) MG tablet Take one tablet by mouth once daily with breakfast  . fluticasone (FLONASE) 50 MCG/ACT nasal spray Place 2 sprays into both nostrils daily.  . furosemide (LASIX) 40 MG tablet Take 1 tablet (40 mg total) by mouth daily.  [name] gabapentin (NEURONTIN) 300 MG capsule TAKE 2 CAPSULES (600 MG TOTAL) BY MOUTH 3 (THREE) TIMES DAILY.  [name] gentamicin cream (GARAMYCIN) 0.1 % Apply 1 application topically 3 (three) times daily.  . hydrALAZINE (APRESOLINE) 50 MG tablet Take 50 mg by mouth 3 (three) times daily.  . insulin aspart (NOVOLOG FLEXPEN) 100 UNIT/ML FlexPen Take three times daily before each meal based on sliding scale 140-199( 2 units), 200-250 (4 units), 251-299 (6 units), 300-349 (  8 units), > 350(10 units)  . Insulin Glargine (LANTUS) 100 UNIT/ML Solostar Pen Injection 0.1 ml (10 units) once daily to control blood sugar. Dx E11.9  . Insulin Pen Needle (B-D UF III MINI PEN NEEDLES) 31G X 5 MM MISC Use twice daily with giving insulin injections. Dx E11.9  . ipratropium-albuterol (DUONEB) 0.5-2.5 (3) MG/3ML SOLN Take 3 mLs by nebulization every 6 (six) hours as  needed (sob). Use 3 times daily x 4 days then every 6 hours as needed.  . isosorbide mononitrate (IMDUR) 30 MG 24 hr tablet Take 2 tablets (60 mg total) by mouth daily.  [name] latanoprost (XALATAN) 0.005 % ophthalmic solution Place 1 drop into both eyes at bedtime.  . levalbuterol (XOPENEX HFA) 45 MCG/ACT inhaler Inhale 2 puffs into the lungs every 6 (six) hours as needed for wheezing or shortness of breath.  . levothyroxine (SYNTHROID, LEVOTHROID) 137 MCG tablet TAKE 1 TABLET (137 MCG TOTAL) BY MOUTH DAILY BEFORE BREAKFAST.  . Maltodextrin-Xanthan Gum (RESOURCE THICKENUP CLEAR) POWD Take 120 g by mouth as needed (use to thicken liquids.).  [name] mometasone-formoterol (DULERA) 200-5 MCG/ACT AERO Inhale 2 puffs into the lungs 2 (two) times daily.  . Multiple Vitamin (MULTIVITAMIN WITH MINERALS) TABS tablet Take 1 tablet by mouth daily.  [name] nystatin (MYCOSTATIN/NYSTOP) powder Apply topically 3 (three) times daily.  [name] oxyCODONE (ROXICODONE) 5 MG immediate release tablet Take 1 tablet (5 mg total) by mouth every 4 (four) hours as needed for severe pain. May take 1/2 tab q4hrs as needed for mild pain.  . OXYGEN Inhale 2 L into the lungs at bedtime.  . pantoprazole (PROTONIX) 40 MG tablet Take 1 tablet (40 mg total) by mouth 2 (two) times daily.  Bertram Gala Glycol-Propyl Glycol 0.4-0.3 % SOLN Place 1 drop into both eyes 4 (four) times daily.   . sertraline (ZOLOFT) 100 MG tablet TAKE ONE TABLET BY MOUTH ONCE DAILY FOR DEPRESSION  . warfarin (COUMADIN) 2.5 MG tablet TAKE AS DIRECTED BY COUMADIN CLINIC.  [name] Water For Irrigation, Sterile (FREE WATER) SOLN Place 60 mLs into feeding tube 3 (three) times daily.   Cliffton Asters Petrolatum-Mineral Oil (SYSTANE NIGHTTIME) OINT Apply 1 application to eye at bedtime. Both eyes   No facility-administered encounter medications on file as of 08/08/2017.     Allergies as of 08/08/2017 - Review Complete 08/08/2017  Allergen Reaction Noted  . Penicillins Itching, Rash, and Other  (See Comments) 02/12/2016  . Other  08/14/2016    Past Medical History:  Diagnosis Date  . Acute respiratory failure (HCC)   . Anemia    previous blood transfusions  . Arthritis    "all over"  . Asthma   . Bradycardia    requiring previous d/c of BB and reduction of amiodarone  . CAD (coronary artery disease)    nonobstructive per notes  . Chronic diastolic CHF (congestive heart failure) (HCC)   . CKD (chronic kidney disease), stage III (HCC)   . Complication of blood transfusion    "got the wrong blood type at [facility] in ~ 2015; no adverse reaction that we are aware of"/daughter, [name] (01/27/2016)  . COPD (chronic obstructive pulmonary disease) (HCC)   . Depression    "light case"  . DVT (deep venous thrombosis) (HCC) 01/2016   a. LLE DVT 01/2016 - switched from Eliquis to Coumadin.  . Gastric stenosis    a. s/p stomach tube  . GERD (gastroesophageal reflux disease)   . History of blood transfusion    "  several" (01/27/2016)  . History of stomach ulcers   . Hyperlipidemia   . Hypertension   . Hypothyroidism   . On home oxygen therapy    "2L; 9PM til 9AM" (06/27/2017)  . Paraesophageal hernia   . Perforated gastric ulcer (HCC)   . Pneumonia    "a few times" (06/27/2017)  . Seasonal allergies   . SIADH (syndrome of inappropriate ADH production) (HCC)    [name] 01/10/2015  . Small bowel obstruction (HCC)    "I don't know how many" (01/11/2015)  . Stroke (HCC)    "light one"  . Type II diabetes mellitus (HCC)    "related to prednisone use  for > 20 yr; once predinose stopped; no more DM RX" (01/27/2016)  . UTI (urinary tract infection) 02/08/2016  . Ventral hernia with bowel obstruction     Past Surgical History:  Procedure Laterality Date  . ABDOMINAL AORTOGRAM N/A 09/21/2016   Procedure: Abdominal Aortogram;  Surgeon: Maeola Harman, MD;  Location: [facility] INVASIVE CV LAB;  Service: Cardiovascular;  Laterality: N/A;  . AMPUTATION Left 09/25/2016    Procedure: AMPUTATION DIGIT- LEFT 2ND AND 3RD TOES;  Surgeon: Larina Earthly, MD;  Location: [facility] OR;  Service: Vascular;  Laterality: Left;  . CATARACT EXTRACTION W/ INTRAOCULAR LENS  IMPLANT, BILATERAL    . CHOLECYSTECTOMY OPEN    . COLECTOMY    . ESOPHAGOGASTRODUODENOSCOPY N/A 01/19/2014   Procedure: ESOPHAGOGASTRODUODENOSCOPY (EGD);  Surgeon: Hilarie Fredrickson, MD;  Location: [facility] ENDOSCOPY;  Service: Endoscopy;  Laterality: N/A;  . ESOPHAGOGASTRODUODENOSCOPY N/A 01/20/2014   Procedure: ESOPHAGOGASTRODUODENOSCOPY (EGD);  Surgeon: Hilarie Fredrickson, MD;  Location: [facility] ENDOSCOPY;  Service: Endoscopy;  Laterality: N/A;  . ESOPHAGOGASTRODUODENOSCOPY N/A 03/19/2014   Procedure: ESOPHAGOGASTRODUODENOSCOPY (EGD);  Surgeon: Rachael Fee, MD;  Location: [facility] ENDOSCOPY;  Service: Endoscopy;  Laterality: N/A;  . ESOPHAGOGASTRODUODENOSCOPY N/A 07/08/2015   Procedure: ESOPHAGOGASTRODUODENOSCOPY (EGD);  Surgeon: Sherrilyn Rist, MD;  Location: [facility] ENDOSCOPY;  Service: Endoscopy;  Laterality: N/A;  . ESOPHAGOGASTRODUODENOSCOPY (EGD) WITH PROPOFOL N/A 09/15/2015   Procedure: ESOPHAGOGASTRODUODENOSCOPY (EGD) WITH PROPOFOL;  Surgeon: Ruffin Frederick, MD;  Location: [facility] ENDOSCOPY;  Service: Gastroenterology;  Laterality: N/A;  . GASTROJEJUNOSTOMY     hx/notes 01/10/2015  . GASTROJEJUNOSTOMY N/A 09/23/2015   Procedure: OPEN GASTROJEJUNOSTOMY TUBE PLACEMENT;  Surgeon: De Blanch Kinsinger, MD;  Location: [facility] ORS;  Service: General;  Laterality: N/A;  . GLAUCOMA SURGERY Bilateral   . HERNIA REPAIR  2015  . IR CM INJ ANY COLONIC TUBE W/FLUORO  01/05/2017  . IR CM INJ ANY COLONIC TUBE W/FLUORO  06/07/2017  . IR GENERIC HISTORICAL  01/07/2016   IR GJ TUBE CHANGE 01/07/2016 Malachy Moan, MD [facility]INTER[facility]  . IR GENERIC HISTORICAL  01/27/2016   IR MECH REMOV OBSTRUC MAT ANY COLON TUBE W/FLUORO 01/27/2016 Richarda Overlie, MD [facility]  . IR GENERIC HISTORICAL  02/07/2016   IR PATIENT EVAL TECH 0-60 MINS Darrell K Allred, PA-C  [facility]  . IR GENERIC HISTORICAL  02/08/2016   IR GJ TUBE CHANGE 02/08/2016 Berdine Dance, MD [facility]  . IR GENERIC HISTORICAL  01/06/2016   IR GJ TUBE CHANGE 01/06/2016 [facility]-RAD OUT REF  . IR GENERIC HISTORICAL  05/02/2016   IR CM INJ ANY COLONIC TUBE W/FLUORO 05/02/2016 Oley Balm, MD [facility]  . IR GENERIC HISTORICAL  05/15/2016   IR GJ TUBE CHANGE 05/15/2016 Simonne Come, MD [facility]  . IR GENERIC HISTORICAL  06/28/2016   IR GJ TUBE CHANGE 06/28/2016 [facility]  . IR GJ TUBE CHANGE  02/20/2017  . IR GJ TUBE CHANGE  05/10/2017  . IR GJ TUBE CHANGE  07/06/2017  . IR GJ TUBE CHANGE  08/02/2017  . IR PATIENT EVAL TECH 0-60 MINS  10/19/2016  . IR PATIENT EVAL TECH 0-60 MINS  12/25/2016  . IR PATIENT EVAL TECH 0-60 MINS  01/29/2017  . IR PATIENT EVAL TECH 0-60 MINS  04/04/2017  . IR PATIENT EVAL TECH 0-60 MINS  04/30/2017  . IR PATIENT EVAL TECH 0-60 MINS  07/31/2017  . IR REPLC DUODEN/JEJUNO TUBE PERCUT W/FLUORO  11/14/2016  . LAPAROTOMY N/A 01/20/2015   Procedure: EXPLORATORY LAPAROTOMY;  Surgeon: Abigail Miyamoto, MD;  Location: [facility] OR;  Service: General;  Laterality: N/A;  . LOWER EXTREMITY ANGIOGRAPHY Left 09/21/2016   Procedure: Lower Extremity Angiography;  Surgeon: Maeola Harman, MD;  Location: [facility] INVASIVE CV LAB;  Service: Cardiovascular;  Laterality: Left;  . LOWER EXTREMITY ANGIOGRAPHY Right 06/27/2017   Procedure: Lower Extremity Angiography;  Surgeon: Maeola Harman, MD;  Location[facility] INVASIVE CV LAB;  Service: Cardiovascular;  Laterality: Right;  . LYSIS OF ADHESION N/A 01/20/2015   Procedure: LYSIS OF ADHESIONS < 1 HOUR;  Surgeon: Abigail Miyamoto, MD;  Location: [facility] OR;  Service: General;  Laterality: N/A;  . PERIPHERAL VASCULAR BALLOON ANGIOPLASTY Left 09/21/2016   Procedure: Peripheral Vascular Balloon Angioplasty;  Surgeon: Maeola Harman, MD;  Location: [facility] INVASIVE CV LAB;  Service: Cardiovascular;  Laterality: Left;  drug coated  balloon  . PERIPHERAL VASCULAR BALLOON ANGIOPLASTY Right 06/27/2017   Procedure: PERIPHERAL VASCULAR BALLOON ANGIOPLASTY;  Surgeon: Maeola Harman, MD;  Location: [facility] INVASIVE CV LAB;  Service: Cardiovascular;  Laterality: Right;  SFA/TPTRUNK  . TONSILLECTOMY    . TUBAL LIGATION    . VENTRAL HERNIA REPAIR  2015   incarcerated ventral hernia ([facility] 09/2013)/notes 01/10/2015    Family History  Problem Relation Age of Onset  . Stroke Mother   . Hypertension Mother   . Diabetes Brother   . Glaucoma Son   . Glaucoma Son   . Heart attack Neg Hx   . Colon cancer Neg Hx   . Stomach cancer Neg Hx     Social History   Socioeconomic History  . Marital status: Widowed    Spouse name: Not on file  . Number of children: 3  . Years of education: Not on file  . Highest education level: Not on file  Occupational History  . Not on file  Social Needs  . Financial resource strain: Not on file  . Food insecurity:    Worry: Not on file    Inability: Not on file  . Transportation needs:    Medical: Not on file    Non-medical: Not on file  Tobacco Use  . Smoking status: Never Smoker  . Smokeless tobacco: Former Neurosurgeon    Types: Snuff  . Tobacco comment: "used snuff in my younger days"  Substance and Sexual Activity  . Alcohol use: Never    Alcohol/week: 0.0 oz    Frequency: Never  . Drug use: Never  . Sexual activity: Not Currently  Lifestyle  . Physical activity:    Days per week: Not on file    Minutes per session: Not on file  . Stress: Not on file  Relationships  . Social connections:    Talks on phone: Not on file    Gets together: Not on file    Attends religious service: Not on file  Active member of club or organization: Not on file    Attends meetings of clubs or organizations: Not on file    Relationship status: Not on file  . Intimate partner violence:    Fear of current or ex partner: Not on file    Emotionally abused: Not on file    Physically abused: Not  on file    Forced sexual activity: Not on file  Other Topics Concern  . Not on file  Social History Narrative   Married in [place], lives in one story house with 2 people and no pets   Occupation: ?   Has a living Will, [place], and doesn't have DNR   Was living with Husband (in frail health) in [place] until 09/2013.  After her [facility] admission they both came to live with daughter in [place]   Review of systems: Review of Systems  Constitutional: Negative for fever and chills.  HENT: Negative.   Eyes: Negative for blurred vision.  Respiratory: as per HPI  Cardiovascular: Negative for chest pain and palpitations.  Gastrointestinal: Negative for vomiting, diarrhea, blood per rectum. Genitourinary: Negative for dysuria, urgency, frequency and hematuria.  Musculoskeletal: Negative for myalgias, back pain and joint pain.  Skin: Negative for itching and rash.  Neurological: Negative for dizziness, tremors, focal weakness, seizures and loss of consciousness.  Endo/Heme/Allergies: Negative for environmental allergies.  Psychiatric/Behavioral: Negative for depression, suicidal ideas and hallucinations.  All other systems reviewed and are negative.  Physical Exam: Blood pressure 124/72, pulse 78, height 5\' 6"  (1.676 m), weight 157 lb (71.2 kg), SpO2 98 %. Gen:      No acute distress HEENT:  EOMI, sclera anicteric Neck:     No masses; no thyromegaly Lungs:    Clear to auscultation bilaterally; normal respiratory effort CV:         Regular rate and rhythm; no murmurs Abd:      + bowel sounds; soft, non-tender; no palpable masses, no distension Ext:    No edema; adequate peripheral perfusion Skin:      Warm and dry; no rash Neuro: alert and oriented x 3 Psych: normal mood and affect  Data Reviewed: CXR 03/10/16- left lower lobe infiltrate with associated effusion.  CXR 06/18/16- mild bronchitis. All images personally reviewed.  Echo 01/28/16 Normal LV systolic function; grade 1 diastolic  dysfunction; mild AI; mild MR; moderate LAE.  CBC with differential 11/15/16-WBC 5.5, eosinophils 19%, absolute eosinophil count 1000  Assessment:  Follow up of dyspnea Dyspnea likely multifactorial secondary to heart failure, chronic kidney disease, volume overload, issues with bradycardia. She has a diagnosis of COPD and asthma but she is a never smoker and does not have symptoms cough, mucus, wheezing. She was unable to complete an FENO or PFTs   CBC noted with peripheral eosinophilia.  Her breathing is responded to [facility] and she continues to be stable since her last visit We will continue the same for now. Continue on nebulizers and xopenex PRN  Return in 1 year  Plan/Recommendations: -Continue Dulera, Xopenex, duo nebs as needed.  Chilton Greathouse MD [facility] 08/08/2017, 12:04 PM  CC: Kirt Boys, DO

## 2017-08-08 NOTE — Progress Notes (Signed)
Subjective:  84 year old female presenting today for follow up evaluation of ulcerations of the right foot. She states the ulceration feel better but still look bad. She has been applying Betadine and wearing the post op shoe as directed. Patient is here for further evaluation and treatment.   Past Medical History:  Diagnosis Date  . Acute respiratory failure ([place])   . Anemia    previous blood transfusions  . Arthritis    "all over"  . Asthma   . Bradycardia    requiring previous d/c of BB and reduction of amiodarone  . CAD (coronary artery disease)    nonobstructive per notes  . Chronic diastolic CHF (congestive heart failure) ([place])   . CKD (chronic kidney disease), stage III ([place])   . Complication of blood transfusion    "got the wrong blood type at [facility] in ~ 2015; no adverse reaction that we are aware of"/daughter, [name] (01/27/2016)  . COPD (chronic obstructive pulmonary disease) ([place])   . Depression    "light case"  . DVT (deep venous thrombosis) ([place]) 01/2016   a. LLE DVT 01/2016 - switched from Eliquis to Coumadin.  . Gastric stenosis    a. s/p stomach tube  . GERD (gastroesophageal reflux disease)   . History of blood transfusion    "several" (01/27/2016)  . History of stomach ulcers   . Hyperlipidemia   . Hypertension   . Hypothyroidism   . On home oxygen therapy    "2L; 9PM til 9AM" (06/27/2017)  . Paraesophageal hernia   . Perforated gastric ulcer ([place])   . Pneumonia    "a few times" (06/27/2017)  . Seasonal allergies   . SIADH (syndrome of inappropriate ADH production) ([place])    [name] 01/10/2015  . Small bowel obstruction ([place])    "I don't know how many" (01/11/2015)  . Stroke ([place])    "light one"  . Type II diabetes mellitus ([place])    "related to prednisone use  for > 20 yr; once predinose stopped; no more DM RX" (01/27/2016)  . UTI (urinary tract infection) 02/08/2016  . Ventral hernia with bowel obstruction      Objective/Physical  Exam General: The patient is alert and oriented x3 in no acute distress.  Dermatology:  Wounds noted to the distal tufts of digits 1, 3, 4, and 5 with complete loss of the toenails to the respective digits noted today after debridement.  Ulcerations appear similar in nature with the right great toe wound measuring approximately 2.5 x 2.5 x 0.3 cm.   Wounds noted to digits 3 4 and 5 measure approximately 1 cm in diameter times 0.2 cm in depth.  Please see picture above  To the noted ulceration(s), there is no eschar. There is a moderate amount of slough, fibrin, and necrotic tissue noted. Granulation tissue and wound base is red. There is a minimal amount of serosanguineous drainage noted. There is no exposed bone muscle-tendon ligament or joint. There is no malodor. Periwound integrity is intact. Skin is warm, dry and supple bilateral lower extremities.  Vascular: Diminished pedal pulses significant in the right lower extremity. No edema or erythema noted.  Extremities are cold to touch  Neurological: Epicritic and protective threshold absent bilaterally.   Musculoskeletal Exam: History of first and second toe amputations left foot  Assessment: #1 ulceration of the right great toe, third, fourth, and fifth digit secondary to diabetes mellitus #2 diabetes mellitus w/ peripheral neuropathy #3 acute ischemic changes third digit right foot #  4 acute critical limb ischemia right foot  Plan of Care:  #1 Patient was evaluated. #2 Medically necessary excisional debridement of the right great toe ulceration including subcutaneous tissue was performed using a tissue nipper and a chisel blade. Excisional debridement of all the necrotic nonviable tissue down to healthy bleeding viable tissue was performed with post-debridement measurements same as pre-. #3 Dry sterile dressing applied.  #4 Continue applying Betadine daily with a dry sterile dressing.  #5 Continue wearing post op shoe. #6 Return to  clinic in 3 weeks.   Edrick Kins, DPM [facility]  Dr. Edrick Kins, [address]                                        [place], [place] [address]                Office [phone]  Fax [phone]

## 2017-08-09 ENCOUNTER — Ambulatory Visit (INDEPENDENT_AMBULATORY_CARE_PROVIDER_SITE_OTHER): Payer: Medicare Other | Admitting: Pharmacist

## 2017-08-09 DIAGNOSIS — I70209 Unspecified atherosclerosis of native arteries of extremities, unspecified extremity: Secondary | ICD-10-CM | POA: Diagnosis not present

## 2017-08-09 DIAGNOSIS — E1151 Type 2 diabetes mellitus with diabetic peripheral angiopathy without gangrene: Secondary | ICD-10-CM | POA: Diagnosis not present

## 2017-08-09 DIAGNOSIS — I4892 Unspecified atrial flutter: Secondary | ICD-10-CM

## 2017-08-09 DIAGNOSIS — Z5181 Encounter for therapeutic drug level monitoring: Secondary | ICD-10-CM

## 2017-08-09 DIAGNOSIS — I130 Hypertensive heart and chronic kidney disease with heart failure and stage 1 through stage 4 chronic kidney disease, or unspecified chronic kidney disease: Secondary | ICD-10-CM | POA: Diagnosis not present

## 2017-08-09 DIAGNOSIS — J449 Chronic obstructive pulmonary disease, unspecified: Secondary | ICD-10-CM | POA: Diagnosis not present

## 2017-08-09 DIAGNOSIS — I2510 Atherosclerotic heart disease of native coronary artery without angina pectoris: Secondary | ICD-10-CM | POA: Diagnosis not present

## 2017-08-09 DIAGNOSIS — Z48812 Encounter for surgical aftercare following surgery on the circulatory system: Secondary | ICD-10-CM | POA: Diagnosis not present

## 2017-08-09 LAB — POCT INR: INR: 2.4

## 2017-08-09 NOTE — Patient Instructions (Signed)
Description   Continue on same dosage 2 tablets daily except 1 tablet on Fridays.  Recheck in 6 weeks.      

## 2017-08-10 DIAGNOSIS — Z48812 Encounter for surgical aftercare following surgery on the circulatory system: Secondary | ICD-10-CM | POA: Diagnosis not present

## 2017-08-10 DIAGNOSIS — I2510 Atherosclerotic heart disease of native coronary artery without angina pectoris: Secondary | ICD-10-CM | POA: Diagnosis not present

## 2017-08-10 DIAGNOSIS — I70209 Unspecified atherosclerosis of native arteries of extremities, unspecified extremity: Secondary | ICD-10-CM | POA: Diagnosis not present

## 2017-08-10 DIAGNOSIS — E1151 Type 2 diabetes mellitus with diabetic peripheral angiopathy without gangrene: Secondary | ICD-10-CM | POA: Diagnosis not present

## 2017-08-10 DIAGNOSIS — I130 Hypertensive heart and chronic kidney disease with heart failure and stage 1 through stage 4 chronic kidney disease, or unspecified chronic kidney disease: Secondary | ICD-10-CM | POA: Diagnosis not present

## 2017-08-10 DIAGNOSIS — J449 Chronic obstructive pulmonary disease, unspecified: Secondary | ICD-10-CM | POA: Diagnosis not present

## 2017-08-13 DIAGNOSIS — I70209 Unspecified atherosclerosis of native arteries of extremities, unspecified extremity: Secondary | ICD-10-CM | POA: Diagnosis not present

## 2017-08-13 DIAGNOSIS — Z48812 Encounter for surgical aftercare following surgery on the circulatory system: Secondary | ICD-10-CM | POA: Diagnosis not present

## 2017-08-13 DIAGNOSIS — I2510 Atherosclerotic heart disease of native coronary artery without angina pectoris: Secondary | ICD-10-CM | POA: Diagnosis not present

## 2017-08-13 DIAGNOSIS — I130 Hypertensive heart and chronic kidney disease with heart failure and stage 1 through stage 4 chronic kidney disease, or unspecified chronic kidney disease: Secondary | ICD-10-CM | POA: Diagnosis not present

## 2017-08-13 DIAGNOSIS — E1151 Type 2 diabetes mellitus with diabetic peripheral angiopathy without gangrene: Secondary | ICD-10-CM | POA: Diagnosis not present

## 2017-08-13 DIAGNOSIS — J449 Chronic obstructive pulmonary disease, unspecified: Secondary | ICD-10-CM | POA: Diagnosis not present

## 2017-08-14 ENCOUNTER — Ambulatory Visit (INDEPENDENT_AMBULATORY_CARE_PROVIDER_SITE_OTHER): Payer: Medicare Other | Admitting: Cardiology

## 2017-08-14 ENCOUNTER — Encounter: Payer: Self-pay | Admitting: Cardiology

## 2017-08-14 VITALS — BP 160/74 | HR 78 | Ht 66.0 in | Wt 158.2 lb

## 2017-08-14 DIAGNOSIS — I480 Paroxysmal atrial fibrillation: Secondary | ICD-10-CM | POA: Diagnosis not present

## 2017-08-14 DIAGNOSIS — I70262 Atherosclerosis of native arteries of extremities with gangrene, left leg: Secondary | ICD-10-CM

## 2017-08-14 DIAGNOSIS — I119 Hypertensive heart disease without heart failure: Secondary | ICD-10-CM | POA: Diagnosis not present

## 2017-08-14 DIAGNOSIS — I739 Peripheral vascular disease, unspecified: Secondary | ICD-10-CM

## 2017-08-14 DIAGNOSIS — I5032 Chronic diastolic (congestive) heart failure: Secondary | ICD-10-CM

## 2017-08-14 NOTE — Patient Instructions (Addendum)
Medication Instructions:   Your physician recommends that you continue on your current medications as directed. Please refer to the Current Medication list given to you today.    Follow-Up:  3 MONTHS WITH AN EXTENDER ON DR NELSON'S TEAM       If you need a refill on your cardiac medications before your next appointment, please call your pharmacy.

## 2017-08-14 NOTE — Progress Notes (Signed)
Patient ID: Shannon [name]                 DOB: 1923/05/30                      MRN: [id number]     HPI: Shannon [name] a 84 y.o.femalepatient ofDr. Laurine Blazer presents today for HTN f/u.PMH includes stage III CKD, COPD, HLD, HTN, afib, chronic diastolic HF (EF 60-65%). Pt was seen by Dr. Montez Morita in office on 8/3 for hypertension related headaches following an ED visit on 8/2 after a home BP reading of 209/93. Her Lasix was stopped, and she was discharged with metoprolol tartrate 25 mg. At the office visit, BP was 146/72. She was switched to Coreg 6.25 mg BID. Pt's daughter called into clinic on 8/13 reporting pt's BP high in the mornings 190-202/70s-90s and HR 60-70. Pt also complained of left leg swelling and weight gain of 5 lbs in the last week and a half. Lasix 40 mg daily was started by Dr. Delton See.  At her HTN clinic visit on 11/22/16, pt's blood pressure was elevated at 164/78 mmHg. Hydralazine was adjusted to dose 50 mg in the mornings, 25 mg in the afternoon, and 25 mg in the evenings to provide better BP control for higher morning readings.  Patient presents today for HTN clinic in good spirits with her daughter. She denies dizziness, lightheadedness, or recent falls. She continues to take her blood pressure in the morning one hour after she takes her medicines and also takes her blood pressure in the evening. She feels comfortable continuing her medication regimen and states her head feels better since her blood pressure has come down. Her home BP readings have ranged 120-150s/60-70s, HR in the low 70s. She does have occasional high readings with systolics in the 170s. Her readings remain higher in the morning compared to her evening readings. She had 1 high reading of 197/78 however states she was moving around a bit when she took this reading.  01/24/2017 - 3 months follow-up, the patient denies any lower extremity edema chest pain or shortness of breath, no orthopnea paroxysmal nocturnal  dyspnea. She's been experiencing abdominal pain that is constant and worsening with free water which is used for flushing her feeding tube. She denies any bleeding. Her INR has been persistently low. She doesn't eat any solid food, only liquids and then she gets feeding the feeding tube at night.  05/01/2017 - this is 3 months follow-up, the patient states that her blood pressure runs in 150-160 range every morning and gets lower in the afternoon. She is using compression stockings are in her lower extremity edema has improved significantly. She denies any chest pain shortness of breath, no bleeding. She has been experiencing dysuria and no last few days.  Aug 14, 2017 -this is a 4 months follow-up, in the interim the patient underwent lower extremity angiogram by Dr. Randie Heinz - Ultrasound-guided cannulation left common femoral artery, drug-coated balloon angioplasty right SFA and popliteal arteries with 5 mm balloon, 3 mm plain balloon angioplasty of tibioperoneal trunk on right, closure of left common femoral artery for continuous access with minx device.  There was only mild peripheral arterial disease on follow-up ABIs. Today patient states she feels great, ulcer on her right foot has been healing since stents placement. She denies any chest pain, shortness of breath, she is back to walking with a walker all day, she denies any bleeding dizziness syncope or falls.   Current HTN  meds: Carvedilol 6.25mg  2 tablets (12.5mg )in the mornings and 1 tablet (6.25mg )in the evenings (10 AM and 6 PM) Lasix 40mg  daily (10 AM) Hydralazine 50 mg in the mornings, 25 mg in the afternoon,and 25 mg in evenings (10 AM, 6 PM, 9 PM) Imdur 30 mg daily (10 AM)  Previously tried:Losartan 100 mg daily, lisinopril 10 mg daily, amlodipine 10 mg daily - swelling  BP goal: <150/90 mmHg  Family History: Mother - Stroke, HTN. Brother - Diabetes.   Social History: Reports that she has never smoked. Has quit using  smokeless tobacco. Denies alcohol or illicit drug use.   Diet:Liquid diet, feeding tube. No coffee. Has diet ginger ale, some diet Coke.   Exercise:Light walking a few times a week, varies.    Wt Readings from Last 3 Encounters:  08/14/17 158 lb 3.2 oz (71.8 kg)  08/08/17 157 lb (71.2 kg)  08/02/17 153 lb (69.4 kg)   BP Readings from Last 3 Encounters:  08/14/17 (!) 160/74  08/08/17 124/72  08/02/17 132/72   Pulse Readings from Last 3 Encounters:  08/14/17 78  08/08/17 78  08/02/17 73    Renal function: Estimated Creatinine Clearance: 27.4 mL/min (A) (by C-G formula based on SCr of 1.33 mg/dL (H)).  Past Medical History:  Diagnosis Date  . Acute respiratory failure (HCC)   . Anemia    previous blood transfusions  . Arthritis    "all over"  . Asthma   . Bradycardia    requiring previous d/c of BB and reduction of amiodarone  . CAD (coronary artery disease)    nonobstructive per notes  . Chronic diastolic CHF (congestive heart failure) (HCC)   . CKD (chronic kidney disease), stage III (HCC)   . Complication of blood transfusion    "got the wrong blood type at [facility] in ~ 2015; no adverse reaction that we are aware of"/daughter, [name] (01/27/2016)  . COPD (chronic obstructive pulmonary disease) (HCC)   . Depression    "light case"  . DVT (deep venous thrombosis) (HCC) 01/2016   a. LLE DVT 01/2016 - switched from Eliquis to Coumadin.  . Gastric stenosis    a. s/p stomach tube  . GERD (gastroesophageal reflux disease)   . History of blood transfusion    "several" (01/27/2016)  . History of stomach ulcers   . Hyperlipidemia   . Hypertension   . Hypothyroidism   . On home oxygen therapy    "2L; 9PM til 9AM" (06/27/2017)  . Paraesophageal hernia   . Perforated gastric ulcer (HCC)   . Pneumonia    "a few times" (06/27/2017)  . Seasonal allergies   . SIADH (syndrome of inappropriate ADH production) (HCC)    [name] 01/10/2015  . Small bowel obstruction (HCC)     "I don't know how many" (01/11/2015)  . Stroke (HCC)    "light one"  . Type II diabetes mellitus (HCC)    "related to prednisone use  for > 20 yr; once predinose stopped; no more DM RX" (01/27/2016)  . UTI (urinary tract infection) 02/08/2016  . Ventral hernia with bowel obstruction     Current Outpatient Medications on File Prior to Visit  Medication Sig Dispense Refill  . acetaminophen (TYLENOL) 500 MG tablet Take 1,000 mg by mouth every 6 (six) hours as needed (pain).    . AMINO ACIDS-PROTEIN HYDROLYS PO Take 30 mLs by mouth 2 (two) times daily.    [name] atorvastatin (LIPITOR) 10 MG tablet TAKE 1 TABLET BY MOUTH EVERY  DAY 90 tablet 1  . b complex vitamins tablet Take 1 tablet by mouth daily.     . calcium-vitamin D (OSCAL WITH D) 500-200 MG-UNIT per tablet Take 1 tablet by mouth daily with breakfast.     . carvedilol (COREG) 6.25 MG tablet Take 6.25 mg by mouth 2 (two) times daily.    . clopidogrel (PLAVIX) 75 MG tablet Take 1 tablet (75 mg total) by mouth daily with breakfast. 90 tablet 0  . collagenase (SANTYL) ointment Apply 1 application topically daily. 30 g 1  . cycloSPORINE (RESTASIS) 0.05 % ophthalmic emulsion USE 1 DROP INTO BOTH EYES TWICE DAILY 60 mL 3  . feeding supplement (OSMOLITE 1 CAL) LIQD Take 711 mLs by mouth at bedtime.     . ferrous sulfate 325 (65 FE) MG tablet Take one tablet by mouth once daily with breakfast 90 tablet 1  . fluticasone (FLONASE) 50 MCG/ACT nasal spray Place 2 sprays into both nostrils daily. 16 g 3  . furosemide (LASIX) 40 MG tablet Take 1 tablet (40 mg total) by mouth daily. 30 tablet 11  . gabapentin (NEURONTIN) 300 MG capsule TAKE 2 CAPSULES (600 MG TOTAL) BY MOUTH 3 (THREE) TIMES DAILY. 180 capsule 5  . gentamicin cream (GARAMYCIN) 0.1 % Apply 1 application topically 3 (three) times daily. 30 g 1  . hydrALAZINE (APRESOLINE) 50 MG tablet Take 50 mg by mouth 3 (three) times daily.    . insulin aspart (NOVOLOG FLEXPEN) 100 UNIT/ML FlexPen Take  three times daily before each meal based on sliding scale 140-199( 2 units), 200-250 (4 units), 251-299 (6 units), 300-349 (8 units), > 350(10 units) 15 pen 11  . Insulin Glargine (LANTUS) 100 UNIT/ML Solostar Pen Injection 0.1 ml (10 units) once daily to control blood sugar. Dx E11.9 15 mL 5  . Insulin Pen Needle (B-D UF III MINI PEN NEEDLES) 31G X 5 MM MISC Use twice daily with giving insulin injections. Dx E11.9 300 each 3  . ipratropium-albuterol (DUONEB) 0.5-2.5 (3) MG/3ML SOLN Take 3 mLs by nebulization every 6 (six) hours as needed (sob). Use 3 times daily x 4 days then every 6 hours as needed. 360 mL 4  . isosorbide mononitrate (IMDUR) 30 MG 24 hr tablet Take 2 tablets (60 mg total) by mouth daily. 180 tablet 2  . latanoprost (XALATAN) 0.005 % ophthalmic solution Place 1 drop into both eyes at bedtime. 2.5 mL 12  . levalbuterol (XOPENEX HFA) 45 MCG/ACT inhaler Inhale 2 puffs into the lungs every 6 (six) hours as needed for wheezing or shortness of breath. 1 Inhaler 12  . levothyroxine (SYNTHROID, LEVOTHROID) 137 MCG tablet TAKE 1 TABLET (137 MCG TOTAL) BY MOUTH DAILY BEFORE BREAKFAST. 30 tablet 3  . Maltodextrin-Xanthan Gum (RESOURCE THICKENUP CLEAR) POWD Take 120 g by mouth as needed (use to thicken liquids.). 20 Can 0  . mometasone-formoterol (DULERA) 200-5 MCG/ACT AERO Inhale 2 puffs into the lungs 2 (two) times daily. 13 g 6  . Multiple Vitamin (MULTIVITAMIN WITH MINERALS) TABS tablet Take 1 tablet by mouth daily.    [name] nystatin (MYCOSTATIN/NYSTOP) powder Apply topically 3 (three) times daily. 15 g 0  . oxyCODONE (ROXICODONE) 5 MG immediate release tablet Take 1 tablet (5 mg total) by mouth every 4 (four) hours as needed for severe pain. May take 1/2 tab q4hrs as needed for mild pain. 30 tablet 0  . OXYGEN Inhale 2 L into the lungs at bedtime.    . pantoprazole (PROTONIX) 40 MG tablet Take 1  tablet (40 mg total) by mouth 2 (two) times daily. 60 tablet 3  . Polyethyl Glycol-Propyl Glycol  0.4-0.3 % SOLN Place 1 drop into both eyes 4 (four) times daily.     . sertraline (ZOLOFT) 100 MG tablet TAKE ONE TABLET BY MOUTH ONCE DAILY FOR DEPRESSION 90 tablet 1  . warfarin (COUMADIN) 2.5 MG tablet TAKE AS DIRECTED BY COUMADIN CLINIC. 65 tablet 1  . Water For Irrigation, Sterile (FREE WATER) SOLN Place 60 mLs into feeding tube 3 (three) times daily.     Cliffton Asters Petrolatum-Mineral Oil (SYSTANE NIGHTTIME) OINT Apply 1 application to eye at bedtime. Both eyes     No current facility-administered medications on file prior to visit.     Allergies  Allergen Reactions  . Penicillins Itching, Rash and Other (See Comments)    Tolerated unasyn, zosyn & cephalosporins in the past.  Has patient had a PCN reaction causing immediate rash, facial/tongue/throat swelling, SOB or lightheadedness with hypotension: Yes Has patient had a PCN reaction causing severe rash involving mucus membranes or skin necrosis: Unk Has patient had a PCN reaction that required hospitalization: Unk Has patient had a PCN reaction occurring within the last 10 years: Unk If all of the above answers are "NO", then may proceed with Cephalosporins  . Other     Patient tolerates amoxicillin and cephalosporins     Assessment/Plan:  1. Hypertension - Blood pressure is currently controlled, repeated BP 132/78 mmHg, blood pressure medications have been decreased recently because she was hypotensive.  We will continue the same regimen.  2. Acute on chronic diastolic CHF, euvolemic, continue the same dose of Lasix, she appears euvolemic and wearing compression socks. Most recent Crea 1.33, improved from 1.5.  3. Afib/ atrial flutter - paroxysmal, rate controlled.  Continue coumadin because of history of stroke as well DVT in April 2018 as well. INR therapeutic to high.  4. PAD - recent interventions as above with great post stenting results healing ulcers, followed by Dr. Randie Heinz.  Follow up in 3 months.  Tobias Alexander,  MD 08/14/2017

## 2017-08-15 DIAGNOSIS — I70209 Unspecified atherosclerosis of native arteries of extremities, unspecified extremity: Secondary | ICD-10-CM | POA: Diagnosis not present

## 2017-08-15 DIAGNOSIS — E1151 Type 2 diabetes mellitus with diabetic peripheral angiopathy without gangrene: Secondary | ICD-10-CM | POA: Diagnosis not present

## 2017-08-15 DIAGNOSIS — J449 Chronic obstructive pulmonary disease, unspecified: Secondary | ICD-10-CM | POA: Diagnosis not present

## 2017-08-15 DIAGNOSIS — I130 Hypertensive heart and chronic kidney disease with heart failure and stage 1 through stage 4 chronic kidney disease, or unspecified chronic kidney disease: Secondary | ICD-10-CM | POA: Diagnosis not present

## 2017-08-15 DIAGNOSIS — Z48812 Encounter for surgical aftercare following surgery on the circulatory system: Secondary | ICD-10-CM | POA: Diagnosis not present

## 2017-08-15 DIAGNOSIS — I2510 Atherosclerotic heart disease of native coronary artery without angina pectoris: Secondary | ICD-10-CM | POA: Diagnosis not present

## 2017-08-17 DIAGNOSIS — J449 Chronic obstructive pulmonary disease, unspecified: Secondary | ICD-10-CM | POA: Diagnosis not present

## 2017-08-17 DIAGNOSIS — I70209 Unspecified atherosclerosis of native arteries of extremities, unspecified extremity: Secondary | ICD-10-CM | POA: Diagnosis not present

## 2017-08-17 DIAGNOSIS — I2510 Atherosclerotic heart disease of native coronary artery without angina pectoris: Secondary | ICD-10-CM | POA: Diagnosis not present

## 2017-08-17 DIAGNOSIS — Z48812 Encounter for surgical aftercare following surgery on the circulatory system: Secondary | ICD-10-CM | POA: Diagnosis not present

## 2017-08-17 DIAGNOSIS — E1151 Type 2 diabetes mellitus with diabetic peripheral angiopathy without gangrene: Secondary | ICD-10-CM | POA: Diagnosis not present

## 2017-08-17 DIAGNOSIS — I130 Hypertensive heart and chronic kidney disease with heart failure and stage 1 through stage 4 chronic kidney disease, or unspecified chronic kidney disease: Secondary | ICD-10-CM | POA: Diagnosis not present

## 2017-08-21 DIAGNOSIS — J449 Chronic obstructive pulmonary disease, unspecified: Secondary | ICD-10-CM | POA: Diagnosis not present

## 2017-08-21 DIAGNOSIS — I70209 Unspecified atherosclerosis of native arteries of extremities, unspecified extremity: Secondary | ICD-10-CM | POA: Diagnosis not present

## 2017-08-21 DIAGNOSIS — I130 Hypertensive heart and chronic kidney disease with heart failure and stage 1 through stage 4 chronic kidney disease, or unspecified chronic kidney disease: Secondary | ICD-10-CM | POA: Diagnosis not present

## 2017-08-21 DIAGNOSIS — I2510 Atherosclerotic heart disease of native coronary artery without angina pectoris: Secondary | ICD-10-CM | POA: Diagnosis not present

## 2017-08-21 DIAGNOSIS — E1151 Type 2 diabetes mellitus with diabetic peripheral angiopathy without gangrene: Secondary | ICD-10-CM | POA: Diagnosis not present

## 2017-08-21 DIAGNOSIS — Z48812 Encounter for surgical aftercare following surgery on the circulatory system: Secondary | ICD-10-CM | POA: Diagnosis not present

## 2017-08-22 DIAGNOSIS — I2510 Atherosclerotic heart disease of native coronary artery without angina pectoris: Secondary | ICD-10-CM | POA: Diagnosis not present

## 2017-08-22 DIAGNOSIS — I130 Hypertensive heart and chronic kidney disease with heart failure and stage 1 through stage 4 chronic kidney disease, or unspecified chronic kidney disease: Secondary | ICD-10-CM | POA: Diagnosis not present

## 2017-08-22 DIAGNOSIS — I70209 Unspecified atherosclerosis of native arteries of extremities, unspecified extremity: Secondary | ICD-10-CM | POA: Diagnosis not present

## 2017-08-22 DIAGNOSIS — J449 Chronic obstructive pulmonary disease, unspecified: Secondary | ICD-10-CM | POA: Diagnosis not present

## 2017-08-22 DIAGNOSIS — Z48812 Encounter for surgical aftercare following surgery on the circulatory system: Secondary | ICD-10-CM | POA: Diagnosis not present

## 2017-08-22 DIAGNOSIS — E1151 Type 2 diabetes mellitus with diabetic peripheral angiopathy without gangrene: Secondary | ICD-10-CM | POA: Diagnosis not present

## 2017-08-28 DIAGNOSIS — I130 Hypertensive heart and chronic kidney disease with heart failure and stage 1 through stage 4 chronic kidney disease, or unspecified chronic kidney disease: Secondary | ICD-10-CM | POA: Diagnosis not present

## 2017-08-28 DIAGNOSIS — Z48812 Encounter for surgical aftercare following surgery on the circulatory system: Secondary | ICD-10-CM | POA: Diagnosis not present

## 2017-08-28 DIAGNOSIS — E1151 Type 2 diabetes mellitus with diabetic peripheral angiopathy without gangrene: Secondary | ICD-10-CM | POA: Diagnosis not present

## 2017-08-28 DIAGNOSIS — I2510 Atherosclerotic heart disease of native coronary artery without angina pectoris: Secondary | ICD-10-CM | POA: Diagnosis not present

## 2017-08-28 DIAGNOSIS — J449 Chronic obstructive pulmonary disease, unspecified: Secondary | ICD-10-CM | POA: Diagnosis not present

## 2017-08-28 DIAGNOSIS — I70209 Unspecified atherosclerosis of native arteries of extremities, unspecified extremity: Secondary | ICD-10-CM | POA: Diagnosis not present

## 2017-08-29 ENCOUNTER — Ambulatory Visit (INDEPENDENT_AMBULATORY_CARE_PROVIDER_SITE_OTHER): Payer: Medicare Other | Admitting: Podiatry

## 2017-08-29 ENCOUNTER — Ambulatory Visit: Payer: Medicare Other | Admitting: Podiatry

## 2017-08-29 DIAGNOSIS — L97512 Non-pressure chronic ulcer of other part of right foot with fat layer exposed: Secondary | ICD-10-CM | POA: Diagnosis not present

## 2017-08-29 DIAGNOSIS — I2510 Atherosclerotic heart disease of native coronary artery without angina pectoris: Secondary | ICD-10-CM | POA: Diagnosis not present

## 2017-08-29 DIAGNOSIS — E0859 Diabetes mellitus due to underlying condition with other circulatory complications: Secondary | ICD-10-CM | POA: Diagnosis not present

## 2017-08-29 DIAGNOSIS — E1151 Type 2 diabetes mellitus with diabetic peripheral angiopathy without gangrene: Secondary | ICD-10-CM | POA: Diagnosis not present

## 2017-08-29 DIAGNOSIS — Z48812 Encounter for surgical aftercare following surgery on the circulatory system: Secondary | ICD-10-CM | POA: Diagnosis not present

## 2017-08-29 DIAGNOSIS — J449 Chronic obstructive pulmonary disease, unspecified: Secondary | ICD-10-CM | POA: Diagnosis not present

## 2017-08-29 DIAGNOSIS — I70209 Unspecified atherosclerosis of native arteries of extremities, unspecified extremity: Secondary | ICD-10-CM | POA: Diagnosis not present

## 2017-08-29 DIAGNOSIS — I70235 Atherosclerosis of native arteries of right leg with ulceration of other part of foot: Secondary | ICD-10-CM

## 2017-08-29 DIAGNOSIS — I130 Hypertensive heart and chronic kidney disease with heart failure and stage 1 through stage 4 chronic kidney disease, or unspecified chronic kidney disease: Secondary | ICD-10-CM | POA: Diagnosis not present

## 2017-08-29 MED ORDER — GENTAMICIN SULFATE 0.1 % EX CREA: 1.0000 "application " | TOPICAL_CREAM | Freq: Three times a day (TID) | CUTANEOUS | 1 refills | Status: DC

## 2017-09-02 NOTE — Progress Notes (Signed)
Subjective:  84 year old female presenting today for follow up evaluation of ulcerations of the right foot. She states the wounds look much better. She has been wearing the post op shoe as directed. Patient is here for further evaluation and treatment.   Past Medical History:  Diagnosis Date  . Acute respiratory failure ([place])   . Anemia    previous blood transfusions  . Arthritis    "all over"  . Asthma   . Bradycardia    requiring previous d/c of BB and reduction of amiodarone  . CAD (coronary artery disease)    nonobstructive per notes  . Chronic diastolic CHF (congestive heart failure) ([place])   . CKD (chronic kidney disease), stage III ([place])   . Complication of blood transfusion    "got the wrong blood type at [facility] in ~ 2015; no adverse reaction that we are aware of"/daughter, [name] (01/27/2016)  . COPD (chronic obstructive pulmonary disease) ([place])   . Depression    "light case"  . DVT (deep venous thrombosis) ([place]) 01/2016   a. LLE DVT 01/2016 - switched from Eliquis to Coumadin.  . Gastric stenosis    a. s/p stomach tube  . GERD (gastroesophageal reflux disease)   . History of blood transfusion    "several" (01/27/2016)  . History of stomach ulcers   . Hyperlipidemia   . Hypertension   . Hypothyroidism   . On home oxygen therapy    "2L; 9PM til 9AM" (06/27/2017)  . Paraesophageal hernia   . Perforated gastric ulcer ([place])   . Pneumonia    "a few times" (06/27/2017)  . Seasonal allergies   . SIADH (syndrome of inappropriate ADH production) ([place])    [name] 01/10/2015  . Small bowel obstruction ([place])    "I don't know how many" (01/11/2015)  . Stroke ([place])    "light one"  . Type II diabetes mellitus ([place])    "related to prednisone use  for > 20 yr; once predinose stopped; no more DM RX" (01/27/2016)  . UTI (urinary tract infection) 02/08/2016  . Ventral hernia with bowel obstruction      Objective/Physical Exam General: The patient is alert and oriented x3  in no acute distress.  Dermatology:  Wounds noted to the distal tufts of digits 1, 3, 4, and 5 with complete loss of the toenails to the respective digits noted today after debridement.  Ulcerations appear similar in nature with the right great toe wound measuring approximately 2.2 x 2.2 x 0.1 cm.   Wounds noted to digits 3 4 and 5 measure approximately 1 cm in diameter times 0.2 cm in depth.    To the noted ulceration(s), there is no eschar. There is a moderate amount of slough, fibrin, and necrotic tissue noted. Granulation tissue and wound base is red. There is a minimal amount of serosanguineous drainage noted. There is no exposed bone muscle-tendon ligament or joint. There is no malodor. Periwound integrity is intact. Skin is warm, dry and supple bilateral lower extremities.  Vascular: Diminished pedal pulses significant in the right lower extremity. No edema or erythema noted.  Extremities are cold to touch  Neurological: Epicritic and protective threshold absent bilaterally.   Musculoskeletal Exam: History of first and second toe amputations left foot  Assessment: #1 ulceration of the right great toe, third, fourth, and fifth digit secondary to diabetes mellitus #2 diabetes mellitus w/ peripheral neuropathy   Plan of Care:  #1 Patient was evaluated. #2 Medically necessary excisional debridement of the  right great toe ulceration including subcutaneous tissue was performed using a tissue nipper and a chisel blade. Excisional debridement of all the necrotic nonviable tissue down to healthy bleeding viable tissue was performed with post-debridement measurements same as pre-. #3 Dry sterile dressing applied.  #4 Prescription for gentamicin cream provided to patient to be applied daily with a Band-Aid.  #5 Continue wearing post op shoe.  #6 Return to clinic in 3 weeks.    Edrick Kins, DPM [facility]  Dr. Edrick Kins, [address]                                         [place], [place] [address]                Office [phone]  Fax [phone]

## 2017-09-19 ENCOUNTER — Other Ambulatory Visit: Payer: Self-pay | Admitting: Internal Medicine

## 2017-09-20 ENCOUNTER — Ambulatory Visit (INDEPENDENT_AMBULATORY_CARE_PROVIDER_SITE_OTHER): Payer: Medicare Other | Admitting: *Deleted

## 2017-09-20 DIAGNOSIS — Z5181 Encounter for therapeutic drug level monitoring: Secondary | ICD-10-CM | POA: Diagnosis not present

## 2017-09-20 DIAGNOSIS — Z8673 Personal history of transient ischemic attack (TIA), and cerebral infarction without residual deficits: Secondary | ICD-10-CM | POA: Diagnosis not present

## 2017-09-20 DIAGNOSIS — I481 Persistent atrial fibrillation: Secondary | ICD-10-CM | POA: Diagnosis not present

## 2017-09-20 DIAGNOSIS — I4819 Other persistent atrial fibrillation: Secondary | ICD-10-CM

## 2017-09-20 DIAGNOSIS — I4892 Unspecified atrial flutter: Secondary | ICD-10-CM

## 2017-09-20 DIAGNOSIS — I82542 Chronic embolism and thrombosis of left tibial vein: Secondary | ICD-10-CM | POA: Diagnosis not present

## 2017-09-20 LAB — POCT INR: INR: 2.4 (ref 2.0–3.0)

## 2017-09-20 NOTE — Patient Instructions (Signed)
Description   Continue on same dosage 2 tablets daily except 1 tablet on Fridays.  Recheck in 6 weeks.      

## 2017-09-26 ENCOUNTER — Encounter: Payer: Self-pay | Admitting: Podiatry

## 2017-09-26 ENCOUNTER — Ambulatory Visit (INDEPENDENT_AMBULATORY_CARE_PROVIDER_SITE_OTHER): Payer: Medicare Other | Admitting: Podiatry

## 2017-09-26 DIAGNOSIS — E0859 Diabetes mellitus due to underlying condition with other circulatory complications: Secondary | ICD-10-CM

## 2017-09-26 DIAGNOSIS — L97512 Non-pressure chronic ulcer of other part of right foot with fat layer exposed: Secondary | ICD-10-CM

## 2017-09-26 DIAGNOSIS — I70235 Atherosclerosis of native arteries of right leg with ulceration of other part of foot: Secondary | ICD-10-CM

## 2017-09-30 NOTE — Progress Notes (Signed)
Subjective:  83 year old female presenting today for follow up evaluation of ulcerations to the right foot. She reports continued soreness and associated drainage. She has been using gentamicin cream and wearing the post op shoe as directed. Patient is here for further evaluation and treatment.   Past Medical History:  Diagnosis Date  . Acute respiratory failure ([place])   . Anemia    previous blood transfusions  . Arthritis    "all over"  . Asthma   . Bradycardia    requiring previous d/c of BB and reduction of amiodarone  . CAD (coronary artery disease)    nonobstructive per notes  . Chronic diastolic CHF (congestive heart failure) ([place])   . CKD (chronic kidney disease), stage III ([place])   . Complication of blood transfusion    "got the wrong blood type at [facility] in ~ 2015; no adverse reaction that we are aware of"/daughter, [name] (01/27/2016)  . COPD (chronic obstructive pulmonary disease) ([place])   . Depression    "light case"  . DVT (deep venous thrombosis) ([place]) 01/2016   a. LLE DVT 01/2016 - switched from Eliquis to Coumadin.  . Gastric stenosis    a. s/p stomach tube  . GERD (gastroesophageal reflux disease)   . History of blood transfusion    "several" (01/27/2016)  . History of stomach ulcers   . Hyperlipidemia   . Hypertension   . Hypothyroidism   . On home oxygen therapy    "2L; 9PM til 9AM" (06/27/2017)  . Paraesophageal hernia   . Perforated gastric ulcer ([place])   . Pneumonia    "a few times" (06/27/2017)  . Seasonal allergies   . SIADH (syndrome of inappropriate ADH production) ([place])    [name] 01/10/2015  . Small bowel obstruction ([place])    "I don't know how many" (01/11/2015)  . Stroke ([place])    "light one"  . Type II diabetes mellitus ([place])    "related to prednisone use  for > 20 yr; once predinose stopped; no more DM RX" (01/27/2016)  . UTI (urinary tract infection) 02/08/2016  . Ventral hernia with bowel obstruction       Objective/Physical  Exam General: The patient is alert and oriented x3 in no acute distress.  Dermatology:  Wound #1 noted to the right hallux measuring 2.5 x 1.5 x 0.3 cm (LxWxD).   Wound #2 noted to the right fourth toe measuring 0.6 x 0.6 x 0.2 cm.   To the noted ulceration(s), there is no eschar. There is a moderate amount of slough, fibrin, and necrotic tissue noted. Granulation tissue and wound base is red. There is a minimal amount of serosanguineous drainage noted. There is no exposed bone muscle-tendon ligament or joint. There is no malodor. Periwound integrity is intact. Skin is warm, dry and supple bilateral lower extremities.  Vascular: Palpable pedal pulses bilaterally. No edema or erythema noted. Capillary refill within normal limits.  Neurological: Epicritic and protective threshold diminished bilaterally.   Musculoskeletal Exam: Range of motion within normal limits to all pedal and ankle joints bilateral. Muscle strength 5/5 in all groups bilateral.   Assessment: #1 ulceration of the right hallux secondary to diabetes mellitus #2 ulceration of the right fourth toe secondary to diabetes mellitus  #3 diabetes mellitus w/ peripheral neuropathy   Plan of Care:  #1 Patient was evaluated. #2 medically necessary excisional debridement including subcutaneous tissue was performed using a tissue nipper and a chisel blade. Excisional debridement of all the necrotic nonviable tissue down  to healthy bleeding viable tissue was performed with post-debridement measurements same as pre-. #3 the wound was cleansed and dry sterile dressing applied. #4 Hold use of gentamicin cream. Recommended betadine and dry sterile dressing for three weeks.  #5 Continue wearing post op shoe.  #6 patient is to return to clinic in 3 weeks.   Edrick Kins, DPM [facility]  Dr. Edrick Kins, [address]                                        [place], [place] [address]                Office  [phone]  Fax [phone]

## 2017-10-02 ENCOUNTER — Other Ambulatory Visit: Payer: Self-pay | Admitting: Cardiology

## 2017-10-02 ENCOUNTER — Other Ambulatory Visit: Payer: Self-pay | Admitting: Internal Medicine

## 2017-10-05 ENCOUNTER — Ambulatory Visit (INDEPENDENT_AMBULATORY_CARE_PROVIDER_SITE_OTHER): Payer: Medicare Other

## 2017-10-05 ENCOUNTER — Other Ambulatory Visit: Payer: Self-pay | Admitting: *Deleted

## 2017-10-05 ENCOUNTER — Other Ambulatory Visit: Payer: Medicare Other

## 2017-10-05 VITALS — BP 158/60 | HR 73 | Temp 97.8°F | Ht 66.0 in | Wt 156.0 lb

## 2017-10-05 DIAGNOSIS — D638 Anemia in other chronic diseases classified elsewhere: Secondary | ICD-10-CM | POA: Diagnosis not present

## 2017-10-05 DIAGNOSIS — K922 Gastrointestinal hemorrhage, unspecified: Secondary | ICD-10-CM

## 2017-10-05 DIAGNOSIS — Z0000 Encounter for general adult medical examination without abnormal findings: Secondary | ICD-10-CM | POA: Diagnosis not present

## 2017-10-05 LAB — CBC WITH DIFFERENTIAL/PLATELET
Basophils Absolute: 40 cells/uL (ref 0–200)
Basophils Relative: 0.6 %
Eosinophils Absolute: 603 cells/uL — ABNORMAL HIGH (ref 15–500)
Eosinophils Relative: 9 %
HCT: 29.7 % — ABNORMAL LOW (ref 35.0–45.0)
Hemoglobin: 9.4 g/dL — ABNORMAL LOW (ref 11.7–15.5)
Lymphs Abs: 844 cells/uL — ABNORMAL LOW (ref 850–3900)
MCH: 28.1 pg (ref 27.0–33.0)
MCHC: 31.6 g/dL — ABNORMAL LOW (ref 32.0–36.0)
MCV: 88.9 fL (ref 80.0–100.0)
MPV: 12 fL (ref 7.5–12.5)
Monocytes Relative: 12.1 %
Neutro Abs: 4402 cells/uL (ref 1500–7800)
Neutrophils Relative %: 65.7 %
Platelets: 265 10*3/uL (ref 140–400)
RBC: 3.34 10*6/uL — ABNORMAL LOW (ref 3.80–5.10)
RDW: 16 % — ABNORMAL HIGH (ref 11.0–15.0)
Total Lymphocyte: 12.6 %
WBC mixed population: 811 cells/uL (ref 200–950)
WBC: 6.7 10*3/uL (ref 3.8–10.8)

## 2017-10-05 MED ORDER — PANTOPRAZOLE SODIUM 40 MG PO TBEC: 40.0000 mg | DELAYED_RELEASE_TABLET | Freq: Two times a day (BID) | ORAL | 3 refills | Status: DC

## 2017-10-05 NOTE — Telephone Encounter (Signed)
PA for pantoprazole completed and approved via Cover My Meds. Refilled and pharmacy notified.

## 2017-10-05 NOTE — Progress Notes (Signed)
Subjective:   Shannon [name] is a 84 y.o. female who presents for Medicare Annual (Subsequent) preventive examination.  Last AWV-08/30/2016    Objective:     Vitals: BP (!) 158/60 (BP Location: Left Arm, Patient Position: Sitting)   Pulse 73   Temp 97.8 F (36.6 C) (Oral)   Ht 5\' 6"  (1.676 m)   Wt 156 lb (70.8 kg)   SpO2 98%   BMI 25.18 kg/m   Body mass index is 25.18 kg/m.  Patient has not taken BP medication.  Advanced Directives 10/05/2017 08/02/2017 06/27/2017 06/27/2017 03/09/2017 02/20/2017 12/12/2016  Does Patient Have a Medical Advance Directive? Yes Yes - Yes Yes No -  Type of Industrial/product designer of [place];Living will Healthcare Power of [place];Living will  Does patient want to make changes to medical advance directive? No - Patient declined No - Patient declined - No - Patient declined - - -  Copy of [place] in Chart? Yes Yes Yes Yes Yes - No - copy requested  Would patient like information on creating a medical advance directive? - - - - - No - Patient declined -    Tobacco Social History   Tobacco Use  Smoking Status Never Smoker  Smokeless Tobacco Former Systems developer  . Types: Snuff  Tobacco Comment   "used snuff in my younger days"     Counseling given: Not Answered Comment: "used snuff in my younger days"   Clinical Intake:  Pre-visit preparation completed: No  Pain : No/denies pain     Nutritional Risks: None Diabetes: No  How often do you need to have someone help you when you read instructions, pamphlets, or other written materials from your doctor or pharmacy?: 3 - Sometimes What is the last grade level you completed in school?: 8th grade  Interpreter Needed?: No  Information entered by :: Tyson Dense, RN  Past Medical History:  Diagnosis Date  . Acute respiratory failure ([place])   . Anemia    previous blood transfusions  . Arthritis    "all over"  . Asthma   . Bradycardia    requiring previous d/c of BB and reduction of amiodarone  . CAD (coronary artery disease)    nonobstructive per notes  . Chronic diastolic CHF (congestive heart failure) ([place])   . CKD (chronic kidney disease), stage III ([place])   . Complication of blood transfusion    "got the wrong blood type at [facility] in ~ 2015; no adverse reaction that we are aware of"/daughter, [name] (01/27/2016)  . COPD (chronic obstructive pulmonary disease) ([place])   . Depression    "light case"  . DVT (deep venous thrombosis) ([place]) 01/2016   a. LLE DVT 01/2016 - switched from Eliquis to Coumadin.  . Gastric stenosis    a. s/p stomach tube  . GERD (gastroesophageal reflux disease)   . History of blood transfusion    "several" (01/27/2016)  . History of stomach ulcers   . Hyperlipidemia   . Hypertension   . Hypothyroidism   . On home oxygen therapy    "2L; 9PM til 9AM" (06/27/2017)  . Paraesophageal hernia   . Perforated gastric ulcer ([place])   . Pneumonia    "a few times" (06/27/2017)  . Seasonal allergies   . SIADH (syndrome of inappropriate ADH production) ([place])    [name] 01/10/2015  . Small bowel obstruction ([place])    "  I don't know how many" (01/11/2015)  . Stroke ([place])    "light one"  . Type II diabetes mellitus ([place])    "related to prednisone use  for > 20 yr; once predinose stopped; no more DM RX" (01/27/2016)  . UTI (urinary tract infection) 02/08/2016  . Ventral hernia with bowel obstruction    Past Surgical History:  Procedure Laterality Date  . ABDOMINAL AORTOGRAM N/A 09/21/2016   Procedure: Abdominal Aortogram;  Surgeon: Waynetta Sandy, MD;  Location: [facility];  Service: Cardiovascular;  Laterality: N/A;  . AMPUTATION Left 09/25/2016   Procedure: AMPUTATION DIGIT- LEFT 2ND AND 3RD TOES;  Surgeon: Rosetta Posner, MD;  Location: [facility];  Service: Vascular;  Laterality: Left;  . CATARACT  EXTRACTION W/ INTRAOCULAR LENS  IMPLANT, BILATERAL    . CHOLECYSTECTOMY OPEN    . COLECTOMY    . ESOPHAGOGASTRODUODENOSCOPY N/A 01/19/2014   Procedure: ESOPHAGOGASTRODUODENOSCOPY (EGD);  Surgeon: Irene Shipper, MD;  Location[facility];  Service: Endoscopy;  Laterality: N/A;  . ESOPHAGOGASTRODUODENOSCOPY N/A 01/20/2014   Procedure: ESOPHAGOGASTRODUODENOSCOPY (EGD);  Surgeon: Irene Shipper, MD;  Location: [facility];  Service: Endoscopy;  Laterality: N/A;  . ESOPHAGOGASTRODUODENOSCOPY N/A 03/19/2014   Procedure: ESOPHAGOGASTRODUODENOSCOPY (EGD);  Surgeon: Milus Banister, MD;  Location: [facility];  Service: Endoscopy;  Laterality: N/A;  . ESOPHAGOGASTRODUODENOSCOPY N/A 07/08/2015   Procedure: ESOPHAGOGASTRODUODENOSCOPY (EGD);  Surgeon: Doran Stabler, MD;  [facility];  Service: Endoscopy;  Laterality: N/A;  . ESOPHAGOGASTRODUODENOSCOPY (EGD) WITH PROPOFOL N/A 09/15/2015   Procedure: ESOPHAGOGASTRODUODENOSCOPY (EGD) WITH PROPOFOL;  Surgeon: Manus Gunning, MD;  [facility]: [facility];  Service: Gastroenterology;  Laterality: N/A;  . GASTROJEJUNOSTOMY     hx/notes 01/10/2015  . GASTROJEJUNOSTOMY N/A 09/23/2015   Procedure: OPEN GASTROJEJUNOSTOMY TUBE PLACEMENT;  Surgeon: Arta Bruce Kinsinger, MD;  Location: [facility];  Service: General;  Laterality: N/A;  . GLAUCOMA SURGERY Bilateral   . HERNIA REPAIR  2015  . IR CM INJ ANY COLONIC TUBE W/FLUORO  01/05/2017  . IR CM INJ ANY COLONIC TUBE W/FLUORO  06/07/2017  . IR GENERIC HISTORICAL  01/07/2016   IR GJ TUBE CHANGE 01/07/2016 Jacqulynn Cadet, MD [facility]  . IR GENERIC HISTORICAL  01/27/2016   IR MECH REMOV OBSTRUC MAT ANY COLON TUBE W/FLUORO 01/27/2016 Markus Daft, MD [facility]  . IR GENERIC HISTORICAL  02/07/2016   IR PATIENT EVAL TECH 0-60 MINS Darrell K Allred, PA-C [facility]  . IR GENERIC HISTORICAL  02/08/2016   IR GJ TUBE CHANGE 02/08/2016 Greggory Keen, MD [facility]  . IR GENERIC HISTORICAL  01/06/2016   IR  GJ TUBE CHANGE 01/06/2016 [facility] OUT REF  . IR GENERIC HISTORICAL  05/02/2016   IR CM INJ ANY COLONIC TUBE W/FLUORO 05/02/2016 Arne Cleveland, MD [facility]  . IR GENERIC HISTORICAL  05/15/2016   IR GJ TUBE CHANGE 05/15/2016 Sandi Mariscal, MD [facility]  . IR GENERIC HISTORICAL  06/28/2016   IR GJ TUBE CHANGE 06/28/2016 [facility]  . IR GJ TUBE CHANGE  02/20/2017  . IR GJ TUBE CHANGE  05/10/2017  . IR GJ TUBE CHANGE  07/06/2017  . IR GJ TUBE CHANGE  08/02/2017  . IR PATIENT EVAL TECH 0-60 MINS  10/19/2016  . IR PATIENT EVAL TECH 0-60 MINS  12/25/2016  . IR PATIENT EVAL TECH 0-60 MINS  01/29/2017  . IR PATIENT EVAL TECH 0-60 MINS  04/04/2017  . IR PATIENT EVAL TECH 0-60 MINS  04/30/2017  . IR  PATIENT EVAL TECH 0-60 MINS  07/31/2017  . IR REPLC DUODEN/JEJUNO TUBE PERCUT W/FLUORO  11/14/2016  . LAPAROTOMY N/A 01/20/2015   Procedure: EXPLORATORY LAPAROTOMY;  Surgeon: Coralie Keens, MD;  Location: [facility];  Service: General;  Laterality: N/A;  . LOWER EXTREMITY ANGIOGRAPHY Left 09/21/2016   Procedure: Lower Extremity Angiography;  Surgeon: Waynetta Sandy, MD;  Location: [facility];  Service: Cardiovascular;  Laterality: Left;  . LOWER EXTREMITY ANGIOGRAPHY Right 06/27/2017   Procedure: Lower Extremity Angiography;  Surgeon: Waynetta Sandy, MD;  Location: [facility];  Service: Cardiovascular;  Laterality: Right;  . LYSIS OF ADHESION N/A 01/20/2015   Procedure: LYSIS OF ADHESIONS < 1 HOUR;  Surgeon: Coralie Keens, MD;  Location: [facility];  Service: General;  Laterality: N/A;  . PERIPHERAL VASCULAR BALLOON ANGIOPLASTY Left 09/21/2016   Procedure: Peripheral Vascular Balloon Angioplasty;  Surgeon: Waynetta Sandy, MD;  Location: [facility];  Service: Cardiovascular;  Laterality: Left;  drug coated balloon  . PERIPHERAL VASCULAR BALLOON ANGIOPLASTY Right 06/27/2017   Procedure: PERIPHERAL VASCULAR BALLOON ANGIOPLASTY;  Surgeon: Waynetta Sandy, MD;   Location: [facility];  Service: Cardiovascular;  Laterality: Right;  SFA/TPTRUNK  . TONSILLECTOMY    . TUBAL LIGATION    . VENTRAL HERNIA REPAIR  2015   incarcerated ventral hernia ([facility] 09/2013)/notes 01/10/2015   Family History  Problem Relation Age of Onset  . Stroke Mother   . Hypertension Mother   . Diabetes Brother   . Glaucoma Son   . Glaucoma Son   . Heart attack Neg Hx   . Colon cancer Neg Hx   . Stomach cancer Neg Hx    Social History   Socioeconomic History  . Marital status: Widowed    Spouse name: Not on file  . Number of children: 3  . Years of education: Not on file  . Highest education level: Not on file  Occupational History  . Not on file  Social Needs  . Financial resource strain: Not hard at all  . Food insecurity:    Worry: Never true    Inability: Never true  . Transportation needs:    Medical: No    Non-medical: No  Tobacco Use  . Smoking status: Never Smoker  . Smokeless tobacco: Former Systems developer    Types: Snuff  . Tobacco comment: "used snuff in my younger days"  Substance and Sexual Activity  . Alcohol use: Never    Alcohol/week: 0.0 oz    Frequency: Never  . Drug use: Never  . Sexual activity: Not Currently  Lifestyle  . Physical activity:    Days per week: 0 days    Minutes per session: 0 min  . Stress: Not at all  Relationships  . Social connections:    Talks on phone: More than three times a week    Gets together: More than three times a week    Attends religious service: Never    Active member of club or organization: No    Attends meetings of clubs or organizations: Never    Relationship status: Widowed  Other Topics Concern  . Not on file  Social History Narrative   Married in [place], lives in one story house with 2 people and no pets   Occupation: ?   Has a living Will, [place], and doesn't have DNR   Was living with Husband (in frail health) in [place] until 09/2013.  After her [facility] admission they both  came to live  with daughter in [place] Encounter Medications as of 10/05/2017  Medication Sig  . acetaminophen (TYLENOL) 500 MG tablet Take 1,000 mg by mouth every 6 (six) hours as needed (pain).  . AMINO ACIDS-PROTEIN HYDROLYS PO Take 30 mLs by mouth 2 (two) times daily.  [name] atorvastatin (LIPITOR) 10 MG tablet TAKE 1 TABLET BY MOUTH EVERY DAY  . b complex vitamins tablet Take 1 tablet by mouth daily.   . calcium-vitamin D (OSCAL WITH D) 500-200 MG-UNIT per tablet Take 1 tablet by mouth daily with breakfast.   . carvedilol (COREG) 6.25 MG tablet Take 6.25 mg by mouth 2 (two) times daily.  . collagenase (SANTYL) ointment Apply 1 application topically daily.  . cycloSPORINE (RESTASIS) 0.05 % ophthalmic emulsion USE 1 DROP INTO BOTH EYES TWICE DAILY  . feeding supplement (OSMOLITE 1 CAL) LIQD Take 711 mLs by mouth at bedtime.   . ferrous sulfate 325 (65 FE) MG tablet Take one tablet by mouth once daily with breakfast  . fluticasone (FLONASE) 50 MCG/ACT nasal spray SPRAY 2 SPRAYS INTO EACH NOSTRIL EVERY DAY  . furosemide (LASIX) 40 MG tablet Take 1 tablet (40 mg total) by mouth daily.  [name] gabapentin (NEURONTIN) 300 MG capsule TAKE 2 CAPSULES (600 MG TOTAL) BY MOUTH 3 (THREE) TIMES DAILY.  [name] gentamicin cream (GARAMYCIN) 0.1 % Apply 1 application topically 3 (three) times daily.  . hydrALAZINE (APRESOLINE) 50 MG tablet Take 50 mg by mouth 3 (three) times daily.  . insulin aspart (NOVOLOG FLEXPEN) 100 UNIT/ML FlexPen Take three times daily before each meal based on sliding scale 140-199( 2 units), 200-250 (4 units), 251-299 (6 units), 300-349 (8 units), > 350(10 units)  . Insulin Glargine (LANTUS) 100 UNIT/ML Solostar Pen Injection 0.1 ml (10 units) once daily to control blood sugar. Dx E11.9  . Insulin Pen Needle (B-D UF III MINI PEN NEEDLES) 31G X 5 MM MISC Use twice daily with giving insulin injections. Dx E11.9  . ipratropium-albuterol (DUONEB) 0.5-2.5 (3) MG/3ML SOLN Take 3 mLs by nebulization  every 6 (six) hours as needed (sob). Use 3 times daily x 4 days then every 6 hours as needed.  . isosorbide mononitrate (IMDUR) 30 MG 24 hr tablet Take 2 tablets (60 mg total) by mouth daily.  [name] latanoprost (XALATAN) 0.005 % ophthalmic solution Place 1 drop into both eyes at bedtime.  . levalbuterol (XOPENEX HFA) 45 MCG/ACT inhaler Inhale 2 puffs into the lungs every 6 (six) hours as needed for wheezing or shortness of breath.  . levothyroxine (SYNTHROID, LEVOTHROID) 137 MCG tablet TAKE 1 TABLET (137 MCG TOTAL) BY MOUTH DAILY BEFORE BREAKFAST.  . Maltodextrin-Xanthan Gum (RESOURCE THICKENUP CLEAR) POWD Take 120 g by mouth as needed (use to thicken liquids.).  [name] mometasone-formoterol (DULERA) 200-5 MCG/ACT AERO Inhale 2 puffs into the lungs 2 (two) times daily.  . Multiple Vitamin (MULTIVITAMIN WITH MINERALS) TABS tablet Take 1 tablet by mouth daily.  [name] nystatin (MYCOSTATIN/NYSTOP) powder Apply topically 3 (three) times daily.  [name] oxyCODONE (ROXICODONE) 5 MG immediate release tablet Take 1 tablet (5 mg total) by mouth every 4 (four) hours as needed for severe pain. May take 1/2 tab q4hrs as needed for mild pain.  . OXYGEN Inhale 2 L into the lungs at bedtime.  . pantoprazole (PROTONIX) 40 MG tablet Take 1 tablet (40 mg total) by mouth 2 (two) times daily.  Vladimir Faster Glycol-Propyl Glycol 0.4-0.3 % SOLN Place 1 drop into both eyes 4 (four) times  daily.   . sertraline (ZOLOFT) 100 MG tablet TAKE ONE TABLET BY MOUTH ONCE DAILY FOR DEPRESSION  . warfarin (COUMADIN) 2.5 MG tablet TAKE AS DIRECTED BY COUMADIN CLINIC.  [name] Water For Irrigation, Sterile (FREE WATER) SOLN Place 60 mLs into feeding tube 3 (three) times daily.   Dema Severin Petrolatum-Mineral Oil (SYSTANE NIGHTTIME) OINT Apply 1 application to eye at bedtime. Both eyes  . clopidogrel (PLAVIX) 75 MG tablet Take 1 tablet (75 mg total) by mouth daily with breakfast.   No facility-administered encounter medications on file as of 10/05/2017.      Activities of Daily Living In your present state of health, do you have any difficulty performing the following activities: 10/05/2017 06/27/2017  Hearing? N Y  Comment - recently received hearing aids  Vision? Y N  Comment glaucoma -  Difficulty concentrating or making decisions? N N  Walking or climbing stairs? Y Y  Dressing or bathing? N N  Doing errands, shopping? [place] and eating ? N -  Using the Toilet? N -  In the past six months, have you accidently leaked urine? N -  Do you have problems with loss of bowel control? Y -  Comment some leaking when she has gas -  Managing your Medications? N -  Managing your Finances? N -  Housekeeping or managing your Housekeeping? N -  Some recent data might be hidden    Patient Care Team: Gildardo Cranker, DO as PCP - General (Internal Medicine) Edrick Kins, DPM as Consulting Physician (Podiatry) Dorothy Spark, MD as Consulting Physician (Cardiology) [name], NP as Nurse Practitioner (Geriatric Medicine)    Assessment:   This is a routine wellness examination for Darletta.  Exercise Activities and Dietary recommendations Current Exercise Habits: The patient does not participate in regular exercise at present, Exercise limited by: orthopedic condition(s)  Goals    None      Fall Risk Fall Risk  10/05/2017 08/02/2017 07/03/2017 05/16/2017 05/01/2017  Falls in the past year? No No No No No  Number falls in past yr: - - - - -  Injury with Fall? - - - - -  Risk for fall due to : - - - - -   Is the patient's home free of loose throw rugs in walkways, pet beds, electrical cords, etc?   yes      Grab bars in the bathroom? no      Handrails on the stairs?   yes      Adequate lighting?   yes  Depression Screen PHQ 2/9 Scores 10/05/2017 02/28/2017 10/25/2016 08/30/2016  PHQ - 2 Score 0 0 - 0  Exception Documentation - - Other- indicate reason in comment box -  Not completed - - call completed with  dtr -     Cognitive Function MMSE - Mini Mental State Exam 10/05/2017 08/30/2016 08/29/2015 11/06/2014  Orientation to time 5 5 5 5   Orientation to Place 5 4 5 5   Registration 3 3 3 3   Attention/ Calculation 3 4 5 3   Recall 0 0 2 2  Language- name 2 objects 2 2 2 2   Language- repeat 1 1 1 1   Language- follow 3 step command 3 3 3 3   Language- read & follow direction 1 1 1 1   Write a sentence 1 0 0 0  Copy design 1 0 0 1  Total score 25 23 27  26  Immunization History  Administered Date(s) Administered  . Influenza, High Dose Seasonal PF 01/19/2017  . Influenza,inj,Quad PF,6+ Mos 12/24/2013, 01/27/2015, 12/29/2015  . Influenza-Unspecified 12/02/2012  . Pneumococcal Conjugate-13 02/03/2014  . Pneumococcal Polysaccharide-23 08/31/2016  . Tdap 07/18/2016  . Zoster 04/12/2011    Qualifies for Shingles Vaccine? Yes, educated and wants to wait  Screening Tests Health Maintenance  Topic Date Due  . FOOT EXAM  08/22/2017  . MAMMOGRAM  10/17/2023 (Originally 10/23/2015)  . OPHTHALMOLOGY EXAM  10/15/2024 (Originally 07/17/2015)  . INFLUENZA VACCINE  11/01/2017  . HEMOGLOBIN A1C  02/01/2018  . TETANUS/TDAP  07/19/2026  . DEXA SCAN  Completed  . PNA vac Low Risk Adult  Completed  . URINE MICROALBUMIN  Discontinued    Cancer Screenings: Lung: Low Dose CT Chest recommended if Age 37-80 years, 30 pack-year currently smoking OR have quit w/in 15years. Patient does not qualify. Breast:  Up to date on Mammogram? Yes   Up to date of Bone Density/Dexa? Yes Colorectal: up to date  Additional Screenings:  Hepatitis C Screening:  Last diabetic eye exam May 2019. Record release from signed    Plan:    I have personally reviewed and addressed the Medicare Annual Wellness questionnaire and have noted the following in the patient's chart:  A. Medical and social history B. Use of alcohol, tobacco or illicit drugs  C. Current medications and supplements D. Functional ability and  status E.  Nutritional status F.  Physical activity G. Advance directives H. List of other physicians I.  Hospitalizations, surgeries, and ER visits in previous 12 months J.  [place] to include hearing, vision, cognitive, depression L. Referrals and appointments - none  In addition, I have reviewed and discussed with patient certain preventive protocols, quality metrics, and best practice recommendations. A written personalized care plan for preventive services as well as general preventive health recommendations were provided to patient.  See attached scanned questionnaire for additional information.   Signed,   Tyson Dense, RN Nurse Health Advisor  Patient Concerns: Dysuria most days she urinates starting 2-3 weeks ago. No signs of blood.

## 2017-10-05 NOTE — Patient Instructions (Addendum)
Shannon [name] , Thank you for taking time to come for your Medicare Wellness Visit. I appreciate your ongoing commitment to your health goals. Please review the following plan we discussed and let me know if I can assist you in the future.   Screening recommendations/referrals: Colonoscopy excluded, over age 82 Mammogram excluded, over age 29 Bone Density up to date Recommended yearly ophthalmology/optometry visit for glaucoma screening and checkup Recommended yearly dental visit for hygiene and checkup  Vaccinations: Influenza vaccine up to date, due 2019 fall season Pneumococcal vaccine up to date, completed Tdap vaccine up to date, due 07/19/2026 Shingles vaccine due    Advanced directives: in chart  Conditions/risks identified: none  Next appointment: Tyson Dense, RN 10/07/2018 @ 11:30am   Preventive Care 65 Years and Older, Female Preventive care refers to lifestyle choices and visits with your health care provider that can promote health and wellness. What does preventive care include?  A yearly physical exam. This is also called an annual well check.  Dental exams once or twice a year.  Routine eye exams. Ask your health care provider how often you should have your eyes checked.  Personal lifestyle choices, including:  Daily care of your teeth and gums.  Regular physical activity.  Eating a healthy diet.  Avoiding tobacco and drug use.  Limiting alcohol use.  Practicing safe sex.  Taking low-dose aspirin every day.  Taking vitamin and mineral supplements as recommended by your health care provider. What happens during an annual well check? The services and screenings done by your health care provider during your annual well check will depend on your age, overall health, lifestyle risk factors, and family history of disease. Counseling  Your health care provider may ask you questions about your:  Alcohol use.  Tobacco use.  Drug use.  Emotional  well-being.  Home and relationship well-being.  Sexual activity.  Eating habits.  History of falls.  Memory and ability to understand (cognition).  Work and work Statistician.  Reproductive health. Screening  You may have the following tests or measurements:  Height, weight, and BMI.  Blood pressure.  Lipid and cholesterol levels. These may be checked every 5 years, or more frequently if you are over 37 years old.  Skin check.  Lung cancer screening. You may have this screening every year starting at age 81 if you have a 30-pack-year history of smoking and currently smoke or have quit within the past 15 years.  Fecal occult blood test (FOBT) of the stool. You may have this test every year starting at age 56.  Flexible sigmoidoscopy or colonoscopy. You may have a sigmoidoscopy every 5 years or a colonoscopy every 10 years starting at age 76.  Hepatitis C blood test.  Hepatitis B blood test.  Sexually transmitted disease (STD) testing.  Diabetes screening. This is done by checking your blood sugar (glucose) after you have not eaten for a while (fasting). You may have this done every 1-3 years.  Bone density scan. This is done to screen for osteoporosis. You may have this done starting at age 50.  Mammogram. This may be done every 1-2 years. Talk to your health care provider about how often you should have regular mammograms. Talk with your health care provider about your test results, treatment options, and if necessary, the need for more tests. Vaccines  Your health care provider may recommend certain vaccines, such as:  Influenza vaccine. This is recommended every year.  Tetanus, diphtheria, and acellular pertussis (Tdap, Td)  vaccine. You may need a Td booster every 10 years.  Zoster vaccine. You may need this after age 42.  Pneumococcal 13-valent conjugate (PCV13) vaccine. One dose is recommended after age 4.  Pneumococcal polysaccharide (PPSV23) vaccine. One  dose is recommended after age 2. Talk to your health care provider about which screenings and vaccines you need and how often you need them. This information is not intended to replace advice given to you by your health care provider. Make sure you discuss any questions you have with your health care provider. Document Released: 04/16/2015 Document Revised: 12/08/2015 Document Reviewed: 01/19/2015 Elsevier Interactive Patient Education  2017 [employer] Prevention in the Home Falls can cause injuries. They can happen to people of all ages. There are many things you can do to make your home safe and to help prevent falls. What can I do on the outside of my home?  Regularly fix the edges of walkways and driveways and fix any cracks.  Remove anything that might make you trip as you walk through a door, such as a raised step or threshold.  Trim any bushes or trees on the path to your home.  Use bright outdoor lighting.  Clear any walking paths of anything that might make someone trip, such as rocks or tools.  Regularly check to see if handrails are loose or broken. Make sure that both sides of any steps have handrails.  Any raised decks and porches should have guardrails on the edges.  Have any leaves, snow, or ice cleared regularly.  Use sand or salt on walking paths during winter.  Clean up any spills in your garage right away. This includes oil or grease spills. What can I do in the bathroom?  Use night lights.  Install grab bars by the toilet and in the tub and shower. Do not use towel bars as grab bars.  Use non-skid mats or decals in the tub or shower.  If you need to sit down in the shower, use a plastic, non-slip stool.  Keep the floor dry. Clean up any water that spills on the floor as soon as it happens.  Remove soap buildup in the tub or shower regularly.  Attach bath mats securely with double-sided non-slip rug tape.  Do not have throw rugs and other  things on the floor that can make you trip. What can I do in the bedroom?  Use night lights.  Make sure that you have a light by your bed that is easy to reach.  Do not use any sheets or blankets that are too big for your bed. They should not hang down onto the floor.  Have a firm chair that has side arms. You can use this for support while you get dressed.  Do not have throw rugs and other things on the floor that can make you trip. What can I do in the kitchen?  Clean up any spills right away.  Avoid walking on wet floors.  Keep items that you use a lot in easy-to-reach places.  If you need to reach something above you, use a strong step stool that has a grab bar.  Keep electrical cords out of the way.  Do not use floor polish or wax that makes floors slippery. If you must use wax, use non-skid floor wax.  Do not have throw rugs and other things on the floor that can make you trip. What can I do with my stairs?  Do not leave  any items on the stairs.  Make sure that there are handrails on both sides of the stairs and use them. Fix handrails that are broken or loose. Make sure that handrails are as long as the stairways.  Check any carpeting to make sure that it is firmly attached to the stairs. Fix any carpet that is loose or worn.  Avoid having throw rugs at the top or bottom of the stairs. If you do have throw rugs, attach them to the floor with carpet tape.  Make sure that you have a light switch at the top of the stairs and the bottom of the stairs. If you do not have them, ask someone to add them for you. What else can I do to help prevent falls?  Wear shoes that:  Do not have high heels.  Have rubber bottoms.  Are comfortable and fit you well.  Are closed at the toe. Do not wear sandals.  If you use a stepladder:  Make sure that it is fully opened. Do not climb a closed stepladder.  Make sure that both sides of the stepladder are locked into place.  Ask  someone to hold it for you, if possible.  Clearly mark and make sure that you can see:  Any grab bars or handrails.  First and last steps.  Where the edge of each step is.  Use tools that help you move around (mobility aids) if they are needed. These include:  Canes.  Walkers.  Scooters.  Crutches.  Turn on the lights when you go into a dark area. Replace any light bulbs as soon as they burn out.  Set up your furniture so you have a clear path. Avoid moving your furniture around.  If any of your floors are uneven, fix them.  If there are any pets around you, be aware of where they are.  Review your medicines with your doctor. Some medicines can make you feel dizzy. This can increase your chance of falling. Ask your doctor what other things that you can do to help prevent falls. This information is not intended to replace advice given to you by your health care provider. Make sure you discuss any questions you have with your health care provider. Document Released: 01/14/2009 Document Revised: 08/26/2015 Document Reviewed: 04/24/2014 Elsevier Interactive Patient Education  2017 [employer].

## 2017-10-08 ENCOUNTER — Telehealth: Payer: Self-pay | Admitting: *Deleted

## 2017-10-08 NOTE — Telephone Encounter (Signed)
PA initiated via CoverMyMeds for pantoprazole. PA approved and said no further action required.

## 2017-10-08 NOTE — Telephone Encounter (Signed)
Additional notification received from [facility] indicating [place] for Protonix approved from 09/05/17-10/05/2018  Per [place] patient and pharmacy already notified   Letter placed in Dr.Carter's review and sign folder, then to scanning.

## 2017-10-08 NOTE — Telephone Encounter (Signed)
Pharmacy and patient have been notified.

## 2017-10-10 ENCOUNTER — Other Ambulatory Visit: Payer: Self-pay | Admitting: Internal Medicine

## 2017-10-10 DIAGNOSIS — I5032 Chronic diastolic (congestive) heart failure: Secondary | ICD-10-CM

## 2017-10-14 ENCOUNTER — Other Ambulatory Visit: Payer: Self-pay | Admitting: Internal Medicine

## 2017-10-15 ENCOUNTER — Ambulatory Visit (HOSPITAL_COMMUNITY)
Admission: RE | Admit: 2017-10-15 | Discharge: 2017-10-15 | Disposition: A | Discharge status: Home / Self Care | Payer: Medicare Other | Source: Ambulatory Visit | Attending: Interventional Radiology | Admitting: Interventional Radiology

## 2017-10-15 ENCOUNTER — Other Ambulatory Visit (HOSPITAL_COMMUNITY): Payer: Self-pay | Admitting: Interventional Radiology

## 2017-10-15 ENCOUNTER — Encounter (HOSPITAL_COMMUNITY): Payer: Self-pay | Admitting: Interventional Radiology

## 2017-10-15 ENCOUNTER — Ambulatory Visit (INDEPENDENT_AMBULATORY_CARE_PROVIDER_SITE_OTHER): Payer: Medicare Other | Admitting: Podiatry

## 2017-10-15 DIAGNOSIS — L97512 Non-pressure chronic ulcer of other part of right foot with fat layer exposed: Secondary | ICD-10-CM | POA: Diagnosis not present

## 2017-10-15 DIAGNOSIS — R633 Feeding difficulties, unspecified: Secondary | ICD-10-CM

## 2017-10-15 DIAGNOSIS — Y733 Surgical instruments, materials and gastroenterology and urology devices (including sutures) associated with adverse incidents: Secondary | ICD-10-CM | POA: Insufficient documentation

## 2017-10-15 DIAGNOSIS — I70235 Atherosclerosis of native arteries of right leg with ulceration of other part of foot: Secondary | ICD-10-CM

## 2017-10-15 DIAGNOSIS — K9413 Enterostomy malfunction: Secondary | ICD-10-CM | POA: Insufficient documentation

## 2017-10-15 DIAGNOSIS — E0859 Diabetes mellitus due to underlying condition with other circulatory complications: Secondary | ICD-10-CM

## 2017-10-15 DIAGNOSIS — K9423 Gastrostomy malfunction: Secondary | ICD-10-CM | POA: Diagnosis not present

## 2017-10-15 HISTORY — PX: IR GJ TUBE CHANGE: IMG1440

## 2017-10-15 MED ORDER — IOPAMIDOL (ISOVUE-300) INJECTION 61%
50.0000 mL | Freq: Once | INTRAVENOUS | Status: AC | PRN
  Administered 2017-10-15: 15 mL

## 2017-10-15 MED ORDER — LIDOCAINE VISCOUS HCL 2 % MT SOLN
OROMUCOSAL | Status: AC
  Filled 2017-10-15: qty 15

## 2017-10-15 MED ORDER — IOPAMIDOL (ISOVUE-300) INJECTION 61%
INTRAVENOUS | Status: AC
  Administered 2017-10-15: 15 mL
  Filled 2017-10-15: qty 50

## 2017-10-15 NOTE — Telephone Encounter (Signed)
Pharmacy is requesting to change prescription from #[phone] with 5 refills to #540 with 2 refills. Request has been sent to provider for approval.

## 2017-10-20 ENCOUNTER — Other Ambulatory Visit: Payer: Self-pay | Admitting: Internal Medicine

## 2017-10-22 NOTE — Progress Notes (Signed)
Subjective:  84 year old female presenting today for follow up evaluation of ulcerations to the right foot. She states the wounds have improved some. She denies modifying factors. She has been using betadine with a dressing daily as well as wearing the post op shoe. Patient is here for further evaluation and treatment.   Past Medical History:  Diagnosis Date  . Acute respiratory failure ([place])   . Anemia    previous blood transfusions  . Arthritis    "all over"  . Asthma   . Bradycardia    requiring previous d/c of BB and reduction of amiodarone  . CAD (coronary artery disease)    nonobstructive per notes  . Chronic diastolic CHF (congestive heart failure) ([place])   . CKD (chronic kidney disease), stage III ([place])   . Complication of blood transfusion    "got the wrong blood type at [facility] in ~ 2015; no adverse reaction that we are aware of"/daughter, [name] (01/27/2016)  . COPD (chronic obstructive pulmonary disease) ([place])   . Depression    "light case"  . DVT (deep venous thrombosis) ([place]) 01/2016   a. LLE DVT 01/2016 - switched from Eliquis to Coumadin.  . Gastric stenosis    a. s/p stomach tube  . GERD (gastroesophageal reflux disease)   . History of blood transfusion    "several" (01/27/2016)  . History of stomach ulcers   . Hyperlipidemia   . Hypertension   . Hypothyroidism   . On home oxygen therapy    "2L; 9PM til 9AM" (06/27/2017)  . Paraesophageal hernia   . Perforated gastric ulcer ([place])   . Pneumonia    "a few times" (06/27/2017)  . Seasonal allergies   . SIADH (syndrome of inappropriate ADH production) ([place])    [name] 01/10/2015  . Small bowel obstruction ([place])    "I don't know how many" (01/11/2015)  . Stroke ([place])    "light one"  . Type II diabetes mellitus ([place])    "related to prednisone use  for > 20 yr; once predinose stopped; no more DM RX" (01/27/2016)  . UTI (urinary tract infection) 02/08/2016  . Ventral hernia with bowel obstruction         Objective/Physical Exam General: The patient is alert and oriented x3 in no acute distress.  Dermatology:  Wound #1 noted to the right hallux measuring 2.0 x 1.3 x 0.2 cm (LxWxD).    To the noted ulceration(s), there is no eschar. There is a moderate amount of slough, fibrin, and necrotic tissue noted. Granulation tissue and wound base is red. There is a minimal amount of serosanguineous drainage noted. There is no exposed bone muscle-tendon ligament or joint. There is no malodor. Periwound integrity is intact. Skin is warm, dry and supple bilateral lower extremities.  Wound noted to the right fourth toe has healed. Complete re-epithelialization has occurred. No drainage noted.   Vascular: Palpable pedal pulses bilaterally. No edema or erythema noted. Capillary refill within normal limits.  Neurological: Epicritic and protective threshold diminished bilaterally.   Musculoskeletal Exam: Range of motion within normal limits to all pedal and ankle joints bilateral. Muscle strength 5/5 in all groups bilateral.   Assessment: #1 ulceration of the right hallux secondary to diabetes mellitus  #2 ulceration of the right fourth toe secondary to diabetes mellitus - healed  #3 diabetes mellitus w/ peripheral neuropathy   Plan of Care:  #1 Patient was evaluated. #2 medically necessary excisional debridement including subcutaneous tissue was performed using a tissue nipper  and a chisel blade. Excisional debridement of all the necrotic nonviable tissue down to healthy bleeding viable tissue was performed with post-debridement measurements same as pre-. #3 the wound was cleansed and dry sterile dressing applied. #4 Continue using betadine daily with a dry sterile dressing.  #5 Continue weightbearing in post op shoe.  #6 Return to clinic in 3 weeks.    Edrick Kins, DPM [facility]  Dr. Edrick Kins, [address]                                         [place], [place] [address]                Office [phone]  Fax [phone]

## 2017-11-01 ENCOUNTER — Ambulatory Visit (INDEPENDENT_AMBULATORY_CARE_PROVIDER_SITE_OTHER): Payer: Medicare Other | Admitting: Family

## 2017-11-01 ENCOUNTER — Ambulatory Visit (INDEPENDENT_AMBULATORY_CARE_PROVIDER_SITE_OTHER): Payer: Medicare Other | Admitting: Pharmacist

## 2017-11-01 ENCOUNTER — Other Ambulatory Visit: Payer: Self-pay | Admitting: Family

## 2017-11-01 ENCOUNTER — Encounter: Payer: Self-pay | Admitting: Family

## 2017-11-01 VITALS — BP 132/64 | HR 68 | Temp 98.2°F | Resp 14 | Ht 66.0 in | Wt 156.4 lb

## 2017-11-01 DIAGNOSIS — I4892 Unspecified atrial flutter: Secondary | ICD-10-CM

## 2017-11-01 DIAGNOSIS — I70262 Atherosclerosis of native arteries of extremities with gangrene, left leg: Secondary | ICD-10-CM | POA: Diagnosis not present

## 2017-11-01 DIAGNOSIS — M150 Primary generalized (osteo)arthritis: Secondary | ICD-10-CM

## 2017-11-01 DIAGNOSIS — M8949 Other hypertrophic osteoarthropathy, multiple sites: Secondary | ICD-10-CM

## 2017-11-01 DIAGNOSIS — M159 Polyosteoarthritis, unspecified: Secondary | ICD-10-CM

## 2017-11-01 DIAGNOSIS — Z5181 Encounter for therapeutic drug level monitoring: Secondary | ICD-10-CM | POA: Diagnosis not present

## 2017-11-01 DIAGNOSIS — R399 Unspecified symptoms and signs involving the genitourinary system: Secondary | ICD-10-CM

## 2017-11-01 LAB — POCT URINALYSIS DIPSTICK
Bilirubin, UA: NEGATIVE
Glucose, UA: NEGATIVE
Ketones, UA: NEGATIVE
Leukocytes, UA: NEGATIVE
Nitrite, UA: NEGATIVE
Odor: NORMAL
Protein, UA: POSITIVE — AB
Spec Grav, UA: <=1.005 — AB (ref 1.010–1.025)
Urobilinogen, UA: 0.2 E.U./dL
pH, UA: 7.5 (ref 5.0–8.0)

## 2017-11-01 LAB — POCT INR: INR: 3 (ref 2.0–3.0)

## 2017-11-01 MED ORDER — CRANBERRY 475 MG PO CAPS: 475.0000 mg | ORAL_CAPSULE | Freq: Two times a day (BID) | ORAL | 3 refills | Status: DC

## 2017-11-01 MED ORDER — LIDOCAINE 4 % EX PTCH: 1.0000 "application " | MEDICATED_PATCH | Freq: Once | CUTANEOUS | 3 refills | Status: AC

## 2017-11-01 NOTE — Progress Notes (Signed)
Provider: Lynix Bonine FNP-C  Gildardo Cranker, DO  Patient Care Team: Gildardo Cranker, DO as PCP - General (Internal Medicine) Edrick Kins, DPM as Consulting Physician (Podiatry) Dorothy Spark, MD as Consulting Physician (Cardiology) [name], NP as Nurse Practitioner (Geriatric Medicine) Camillo Flaming, [place] as Referring Physician [facility])  Extended Emergency Contact Information Primary Emergency Contact: Armstrong,[name] Address: [address]          [place], [place] [address] Johnnette Litter of [place] Phone: [phone] Mobile Phone: [phone] Relation: Daughter Secondary Emergency Contact: Hoffmeier,John  [place] Phone: [phone] Relation: Son   Goals of care: Advanced Directive information Advanced Directives 10/05/2017  Does Patient Have a Medical Advance Directive? Yes  Type of Advance Directive [place]  Does patient want to make changes to medical advance directive? No - Patient declined  Copy of [place] in Chart? Yes  Would patient like information on creating a medical advance directive? -     Chief Complaint  Patient presents with  . Dysuria    x2-3 weeks    HPI:  Pt is a 84 y.o. female seen today at [facility] office for an acute visit for evaluation of burning and itching with urination for the past 3 weeks.she states drinks plenty of water and tries to clean herself well after using the bathroom but sometimes notices some stool on her underwear whenever she has loose stool.she wipes from front to back.she denies any fever,chills,nausea,vomiting,abdominal pain or back pain.  Also complains of worsening left shoulder/neck arthritis pain.she takes Tylenol for pain but wears out.No recent injuries to shoulder and neck.of note she states fell on her neck in the past.   Past Medical History:  Diagnosis Date  . Acute respiratory failure ([place])   . Anemia    previous blood  transfusions  . Arthritis    "all over"  . Asthma   . Bradycardia    requiring previous d/c of BB and reduction of amiodarone  . CAD (coronary artery disease)    nonobstructive per notes  . Chronic diastolic CHF (congestive heart failure) ([place])   . CKD (chronic kidney disease), stage III ([place])   . Complication of blood transfusion    "got the wrong blood type at [facility] in ~ 2015; no adverse reaction that we are aware of"/daughter, [name] (01/27/2016)  . COPD (chronic obstructive pulmonary disease) ([place])   . Depression    "light case"  . DVT (deep venous thrombosis) ([place]) 01/2016   a. LLE DVT 01/2016 - switched from Eliquis to Coumadin.  . Gastric stenosis    a. s/p stomach tube  . GERD (gastroesophageal reflux disease)   . History of blood transfusion    "several" (01/27/2016)  . History of stomach ulcers   . Hyperlipidemia   . Hypertension   . Hypothyroidism   . On home oxygen therapy    "2L; 9PM til 9AM" (06/27/2017)  . Paraesophageal hernia   . Perforated gastric ulcer ([place])   . Pneumonia    "a few times" (06/27/2017)  . Seasonal allergies   . SIADH (syndrome of inappropriate ADH production) ([place])    [name] 01/10/2015  . Small bowel obstruction ([place])    "I don't know how many" (01/11/2015)  . Stroke ([place])    "light one"  . Type II diabetes mellitus ([place])    "related to prednisone use  for > 20 yr; once predinose stopped; no more DM RX" (01/27/2016)  .  UTI (urinary tract infection) 02/08/2016  . Ventral hernia with bowel obstruction    Past Surgical History:  Procedure Laterality Date  . ABDOMINAL AORTOGRAM N/A 09/21/2016   Procedure: Abdominal Aortogram;  Surgeon: Waynetta Sandy, MD;  Location: [place] [facility];  Service: Cardiovascular;  Laterality: N/A;  . AMPUTATION Left 09/25/2016   Procedure: AMPUTATION DIGIT- LEFT 2ND AND 3RD TOES;  Surgeon: Rosetta Posner, MD;  Location: [place];  Service: Vascular;  Laterality: Left;  . CATARACT EXTRACTION W/  INTRAOCULAR LENS  IMPLANT, BILATERAL    . CHOLECYSTECTOMY OPEN    . COLECTOMY    . ESOPHAGOGASTRODUODENOSCOPY N/A 01/19/2014   Procedure: ESOPHAGOGASTRODUODENOSCOPY (EGD);  Surgeon: Irene Shipper, MD;  Location: [facility];  Service: Endoscopy;  Laterality: N/A;  . ESOPHAGOGASTRODUODENOSCOPY N/A 01/20/2014   Procedure: ESOPHAGOGASTRODUODENOSCOPY (EGD);  Surgeon: Irene Shipper, MD;  Location: [facility];  Service: Endoscopy;  Laterality: N/A;  . ESOPHAGOGASTRODUODENOSCOPY N/A 03/19/2014   Procedure: ESOPHAGOGASTRODUODENOSCOPY (EGD);  Surgeon: Milus Banister, MD;  Location: [facility];  Service: Endoscopy;  Laterality: N/A;  . ESOPHAGOGASTRODUODENOSCOPY N/A 07/08/2015   Procedure: ESOPHAGOGASTRODUODENOSCOPY (EGD);  Surgeon: Doran Stabler, MD;  [facility];  Service: Endoscopy;  Laterality: N/A;  . ESOPHAGOGASTRODUODENOSCOPY (EGD) WITH PROPOFOL N/A 09/15/2015   Procedure: ESOPHAGOGASTRODUODENOSCOPY (EGD) WITH PROPOFOL;  Surgeon: Manus Gunning, MD;  Location: [facility];  Service: Gastroenterology;  Laterality: N/A;  . GASTROJEJUNOSTOMY     hx/notes 01/10/2015  . GASTROJEJUNOSTOMY N/A 09/23/2015   Procedure: OPEN GASTROJEJUNOSTOMY TUBE PLACEMENT;  Surgeon: Arta Bruce Kinsinger, MD;  Location: [facility];  Service: General;  Laterality: N/A;  . GLAUCOMA SURGERY Bilateral   . HERNIA REPAIR  2015  . IR CM INJ ANY COLONIC TUBE W/FLUORO  01/05/2017  . IR CM INJ ANY COLONIC TUBE W/FLUORO  06/07/2017  . IR GENERIC HISTORICAL  01/07/2016   IR GJ TUBE CHANGE 01/07/2016 Jacqulynn Cadet, MD [facility]  . IR GENERIC HISTORICAL  01/27/2016   IR MECH REMOV OBSTRUC MAT ANY COLON TUBE W/FLUORO 01/27/2016 Markus Daft, MD [facility]  . IR GENERIC HISTORICAL  02/07/2016   IR PATIENT EVAL TECH 0-60 MINS Darrell K Allred, PA-C [facility]  . IR GENERIC HISTORICAL  02/08/2016   IR GJ TUBE CHANGE 02/08/2016 Greggory Keen, MD [facility]  . IR GENERIC HISTORICAL  01/06/2016   IR GJ TUBE  CHANGE 01/06/2016 [facility] OUT REF  . IR GENERIC HISTORICAL  05/02/2016   IR CM INJ ANY COLONIC TUBE W/FLUORO 05/02/2016 Arne Cleveland, MD [facility]  . IR GENERIC HISTORICAL  05/15/2016   IR GJ TUBE CHANGE 05/15/2016 Sandi Mariscal, MD [facility]  . IR GENERIC HISTORICAL  06/28/2016   IR GJ TUBE CHANGE 06/28/2016 [facility]  . IR GJ TUBE CHANGE  02/20/2017  . IR GJ TUBE CHANGE  05/10/2017  . IR GJ TUBE CHANGE  07/06/2017  . IR GJ TUBE CHANGE  08/02/2017  . IR GJ TUBE CHANGE  10/15/2017  . IR PATIENT EVAL TECH 0-60 MINS  10/19/2016  . IR PATIENT EVAL TECH 0-60 MINS  12/25/2016  . IR PATIENT EVAL TECH 0-60 MINS  01/29/2017  . IR PATIENT EVAL TECH 0-60 MINS  04/04/2017  . IR PATIENT EVAL TECH 0-60 MINS  04/30/2017  . IR PATIENT EVAL TECH 0-60 MINS  07/31/2017  . IR REPLC DUODEN/JEJUNO TUBE PERCUT W/FLUORO  11/14/2016  . LAPAROTOMY N/A 01/20/2015   Procedure: EXPLORATORY LAPAROTOMY;  Surgeon: Coralie Keens, MD;  Location: [place];  Service: General;  Laterality: N/A;  . LOWER EXTREMITY ANGIOGRAPHY Left 09/21/2016   Procedure: Lower Extremity Angiography;  Surgeon: Waynetta Sandy, MD;  Location: [place] [facility];  Service: Cardiovascular;  Laterality: Left;  . LOWER EXTREMITY ANGIOGRAPHY Right 06/27/2017   Procedure: Lower Extremity Angiography;  Surgeon: Waynetta Sandy, MD;  Location: [place] [facility];  Service: Cardiovascular;  Laterality: Right;  . LYSIS OF ADHESION N/A 01/20/2015   Procedure: LYSIS OF ADHESIONS < 1 HOUR;  Surgeon: Coralie Keens, MD;  Location: [place];  Service: General;  Laterality: N/A;  . PERIPHERAL VASCULAR BALLOON ANGIOPLASTY Left 09/21/2016   Procedure: Peripheral Vascular Balloon Angioplasty;  Surgeon: Waynetta Sandy, MD;  Location: [place] [facility];  Service: Cardiovascular;  Laterality: Left;  drug coated balloon  . PERIPHERAL VASCULAR BALLOON ANGIOPLASTY Right 06/27/2017   Procedure: PERIPHERAL VASCULAR BALLOON ANGIOPLASTY;  Surgeon: Waynetta Sandy, MD;  Location: [place] [facility];  Service: Cardiovascular;  Laterality: Right;  SFA/TPTRUNK  . TONSILLECTOMY    . TUBAL LIGATION    . VENTRAL HERNIA REPAIR  2015   incarcerated ventral hernia ([facility] 09/2013)/notes 01/10/2015    Allergies  Allergen Reactions  . Penicillins Itching, Rash and Other (See Comments)    Tolerated unasyn, zosyn & cephalosporins in the past.  Has patient had a PCN reaction causing immediate rash, facial/tongue/throat swelling, SOB or lightheadedness with hypotension: Yes Has patient had a PCN reaction causing severe rash involving mucus membranes or skin necrosis: Unk Has patient had a PCN reaction that required hospitalization: Unk Has patient had a PCN reaction occurring within the last 10 years: Unk If all of the above answers are "NO", then may proceed with Cephalosporins  . Other     Patient tolerates amoxicillin and cephalosporins    Outpatient Encounter Medications as of 11/01/2017  Medication Sig  . acetaminophen (TYLENOL) 500 MG tablet Take 1,000 mg by mouth every 6 (six) hours as needed (pain).  . AMINO ACIDS-PROTEIN HYDROLYS PO Take 30 mLs by mouth 2 (two) times daily.  [name] atorvastatin (LIPITOR) 10 MG tablet TAKE 1 TABLET BY MOUTH EVERY DAY  . b complex vitamins tablet Take 1 tablet by mouth daily.   . calcium-vitamin D (OSCAL WITH D) 500-200 MG-UNIT per tablet Take 1 tablet by mouth daily with breakfast.   . carvedilol (COREG) 6.25 MG tablet Take 6.25 mg by mouth 2 (two) times daily.  . collagenase (SANTYL) ointment Apply 1 application topically daily.  . cycloSPORINE (RESTASIS) 0.05 % ophthalmic emulsion USE 1 DROP INTO BOTH EYES TWICE DAILY  . feeding supplement (OSMOLITE 1 CAL) LIQD Take 711 mLs by mouth at bedtime.   . ferrous sulfate 325 (65 FE) MG tablet Take one tablet by mouth once daily with breakfast  . fluticasone (FLONASE) 50 MCG/ACT nasal spray SPRAY 2 SPRAYS INTO EACH NOSTRIL EVERY DAY  . furosemide (LASIX) 40 MG  tablet Take 1 tablet (40 mg total) by mouth daily.  [name] gabapentin (NEURONTIN) 300 MG capsule TAKE 2 CAPSULES (600 MG TOTAL) BY MOUTH 3 (THREE) TIMES DAILY.  [name] gentamicin cream (GARAMYCIN) 0.1 % Apply 1 application topically 3 (three) times daily.  . hydrALAZINE (APRESOLINE) 50 MG tablet Take 50 mg by mouth 3 (three) times daily.  . insulin aspart (NOVOLOG FLEXPEN) 100 UNIT/ML FlexPen Take three times daily before each meal based on sliding scale 140-199( 2 units), 200-250 (4 units), 251-299 (6 units), 300-349 (8 units), > 350(10 units)  . Insulin Glargine (LANTUS) 100 UNIT/ML  Solostar Pen Injection 0.1 ml (10 units) once daily to control blood sugar. Dx E11.9  . Insulin Pen Needle (B-D UF III MINI PEN NEEDLES) 31G X 5 MM MISC Use twice daily with giving insulin injections. Dx E11.9  . isosorbide mononitrate (IMDUR) 30 MG 24 hr tablet Take 2 tablets (60 mg total) by mouth daily.  [name] latanoprost (XALATAN) 0.005 % ophthalmic solution Place 1 drop into both eyes at bedtime.  [name] levothyroxine (SYNTHROID, LEVOTHROID) 137 MCG tablet TAKE 1 TABLET (137 MCG TOTAL) BY MOUTH DAILY BEFORE BREAKFAST.  . mometasone-formoterol (DULERA) 200-5 MCG/ACT AERO Inhale 2 puffs into the lungs 2 (two) times daily.  . Multiple Vitamin (MULTIVITAMIN WITH MINERALS) TABS tablet Take 1 tablet by mouth daily.  [name] nystatin (MYCOSTATIN/NYSTOP) powder Apply topically 3 (three) times daily.  [name] oxyCODONE (ROXICODONE) 5 MG immediate release tablet Take 1 tablet (5 mg total) by mouth every 4 (four) hours as needed for severe pain. May take 1/2 tab q4hrs as needed for mild pain.  . OXYGEN Inhale 2 L into the lungs at bedtime.  . pantoprazole (PROTONIX) 40 MG tablet Take 1 tablet (40 mg total) by mouth 2 (two) times daily.  Vladimir Faster Glycol-Propyl Glycol 0.4-0.3 % SOLN Place 1 drop into both eyes 4 (four) times daily.   . sertraline (ZOLOFT) 100 MG tablet TAKE ONE TABLET BY MOUTH ONCE DAILY FOR DEPRESSION  . warfarin (COUMADIN) 2.5 MG  tablet TAKE AS DIRECTED BY COUMADIN CLINIC.  [name] Water For Irrigation, Sterile (FREE WATER) SOLN Place 60 mLs into feeding tube 3 (three) times daily.   Dema Severin Petrolatum-Mineral Oil (SYSTANE NIGHTTIME) OINT Apply 1 application to eye at bedtime. Both eyes  . ipratropium-albuterol (DUONEB) 0.5-2.5 (3) MG/3ML SOLN Take 3 mLs by nebulization every 6 (six) hours as needed (sob). Use 3 times daily x 4 days then every 6 hours as needed. (Patient not taking: Reported on 11/01/2017)  . levalbuterol (XOPENEX HFA) 45 MCG/ACT inhaler Inhale 2 puffs into the lungs every 6 (six) hours as needed for wheezing or shortness of breath. (Patient not taking: Reported on 11/01/2017)  . [DISCONTINUED] carvedilol (COREG) 6.25 MG tablet TAKE 2 TABS BY MOUTH IN AM AND 1 TAB BY MOUTH IN PM FOR BLOOD PRESSURE  . [DISCONTINUED] clopidogrel (PLAVIX) 75 MG tablet Take 1 tablet (75 mg total) by mouth daily with breakfast. (Patient not taking: Reported on 11/01/2017)  . [DISCONTINUED] Maltodextrin-Xanthan Gum (RESOURCE THICKENUP CLEAR) POWD Take 120 g by mouth as needed (use to thicken liquids.).   No facility-administered encounter medications on file as of 11/01/2017.     Review of Systems  Constitutional: Negative for appetite change, chills, fatigue and fever.  Gastrointestinal: Negative for abdominal distention, abdominal pain, constipation, diarrhea, nausea and vomiting.  Genitourinary: Positive for dysuria. Negative for flank pain, frequency and urgency.  Musculoskeletal: Positive for arthralgias and gait problem.       Left shoulder pain   Psychiatric/Behavioral: Negative for agitation, confusion and sleep disturbance.    Immunization History  Administered Date(s) Administered  . Influenza, High Dose Seasonal PF 01/19/2017  . Influenza,inj,Quad PF,6+ Mos 12/24/2013, 01/27/2015, 12/29/2015  . Influenza-Unspecified 12/02/2012  . Pneumococcal Conjugate-13 02/03/2014  . Pneumococcal Polysaccharide-23 08/31/2016  . Tdap  07/18/2016  . Zoster 04/12/2011   Pertinent  Health Maintenance Due  Topic Date Due  . FOOT EXAM  08/22/2017  . INFLUENZA VACCINE  11/01/2017  . MAMMOGRAM  10/17/2023 (Originally 10/23/2015)  . HEMOGLOBIN A1C  02/01/2018  . OPHTHALMOLOGY EXAM  05/07/2018  .  DEXA SCAN  Completed  . PNA vac Low Risk Adult  Completed   Fall Risk  10/05/2017 08/02/2017 07/03/2017 05/16/2017 05/01/2017  Falls in the past year? No No No No No  Number falls in past yr: - - - - -  Injury with Fall? - - - - -  Risk for fall due to : - - - - -   Functional Status Survey:    Vitals:   11/01/17 1127  BP: 132/64  Pulse: 68  Resp: 14  Temp: 98.2 F (36.8 C)  TempSrc: Oral  SpO2: 98%  Weight: 156 lb 6.4 oz (70.9 kg)  Height: 5\' 6"  (1.676 m)   Body mass index is 25.24 kg/m. Physical Exam  Constitutional: She appears well-developed.  Elderly in no acute distress  HENT:  Hearing aids in place   Eyes: Pupils are equal, round, and reactive to light. Conjunctivae are normal. Right eye exhibits no discharge. Left eye exhibits no discharge. No scleral icterus.  Neck: Neck supple. No JVD present.  Cardiovascular: Intact distal pulses. Exam reveals no gallop and no friction rub.  Murmur heard. Pulmonary/Chest: Effort normal and breath sounds normal. No respiratory distress. She has no wheezes. She has no rales.  Abdominal: Soft. Bowel sounds are normal. She exhibits no distension and no mass. There is no tenderness. There is no rebound and no guarding.  Musculoskeletal: She exhibits no tenderness.  Unsteady gait ambulates with walker.Left shoulder limited ROM due to arthritic pain.   Lymphadenopathy:    She has no cervical adenopathy.  Skin: Skin is warm and dry. No rash noted. No erythema. No pallor.  Psychiatric: She has a normal mood and affect. Her speech is normal and behavior is normal. Thought content normal.  Vitals reviewed.   Labs reviewed: Recent Labs    02/22/17 0436 02/23/17 0330   06/28/17 0324 08/01/17 1039 08/07/17 1119  NA 141 137   < > 135 136 136  K 4.2 4.1   < > 4.1 4.7 4.7  CL 114* 111   < > 101 100 98  CO2 22 22   < > 25 30 25   GLUCOSE 146* 142*   < > 263* 125* 70  BUN 32* 24*   < > 48* 65* 52*  CREATININE 1.03* 0.99   < > 1.41* 1.53* 1.33*  CALCIUM 8.9 9.0   < > 8.8* 9.3 9.2  MG 1.9 1.6*  --   --   --   --   PHOS  --  2.1*  --   --   --   --    < > = values in this interval not displayed.   Recent Labs    11/02/16 1710 08/01/17 1039 08/07/17 1119  AST 24 20 22   ALT 19 14 15   ALKPHOS 58  --  99  BILITOT 0.5 0.3 0.2  PROT 7.0 8.0 8.1  ALBUMIN 3.5  --  4.1   Recent Labs    08/01/17 1039 08/07/17 1119 10/05/17 1047  WBC 6.3 5.8 6.7  NEUTROABS 3,975 3.3 4,402  HGB 7.9* 8.6* 9.4*  HCT 24.3* 26.9* 29.7*  MCV 86.5 88 88.9  PLT 288 331 265   Lab Results  Component Value Date   TSH 4.880 (H) 08/07/2017   Lab Results  Component Value Date   HGBA1C 7.0 (H) 08/01/2017   Lab Results  Component Value Date   CHOL 97 08/01/2017   HDL 53 08/01/2017   LDLCALC 23 08/01/2017   TRIG  120 08/01/2017   CHOLHDL 1.8 08/01/2017    Significant Diagnostic Results in last 30 days:  Ir Gj Tube Change  Result Date: 10/15/2017 INDICATION: Long-term indwelling gastrojejunostomy catheter. Hub is broken externally. Exchange requested. EXAM: Exchange of dual-lumen gastrojejunal catheter under fluoroscopy MEDICATIONS: Viscous lidocaine topically at the skin insertion site ANESTHESIA/SEDATION: None required CONTRAST:  15 mL Isovue-300-administered into the gastric lumen. FLUOROSCOPY TIME:  1.2 minutes; 811  uGym2 DAP COMPLICATIONS: None immediate. PROCEDURE: Informed written consent was obtained from the patient after a thorough discussion of the procedural risks, benefits and alternatives. All questions were addressed. Maximal Sterile Barrier Technique was utilized including caps, mask, sterile gowns, sterile gloves, sterile drape, hand hygiene and skin  antiseptic. A timeout was performed prior to the initiation of the procedure. Fluoroscopic inspection demonstrated that the previously placed catheter was in good position. Injection confirmed tip of the catheter at the ligament of Treitz. The catheter was removed over a Guidewire.   A new 80 French dual-lumen gastrojejunostomy catheter was advanced over the wire , positioned with the tip at the ligament of Treitz.  The retention balloon was inflated. Injection of both lumens demonstrates wide patency and adequate positioning. The external bumper was applied and the catheter was flushed. The patient tolerated the procedure well. IMPRESSION: 1. Technically successful exchange of gastrojejunal catheter under fluoroscopy Electronically Signed   By: Lucrezia Europe M.D.   On: 10/15/2017 15:08    Assessment/Plan 1. Urinary tract infection symptoms Afebrile.Pain and burning sensation with voiding. - POC Urinalysis Dipstick - Urine culture   2. Primary osteoarthritis involving multiple joints Worsening left shoulder pain.limited ROM due to pain.apply Lidocaine 4 % patch on left shoulder daily remove after 12 hours.continue on Tylenol 1000 mg tablet as needed.  Family/ staff Communication: Reviewed plan of care with patient and daughter. Labs/tests ordered: U/a and C/S rule out UTI   Sandrea Hughs, NP

## 2017-11-01 NOTE — Patient Instructions (Addendum)
1. Take cranberry 475 mg tablet one by mouth twice daily for urinary tract infections prevention. 2. Apply Lidocaine patch to painful area on your left shoulder/neck area once daily.Remove patch after 12 hours. 3. Urine specimen collected today culture results will be back in 3 days.Will call you with culture results.  Urinary Tract Infection, Adult A urinary tract infection (UTI) is an infection of any part of the urinary tract. The urinary tract includes the:  Kidneys.  Ureters.  Bladder.  Urethra.  These organs make, store, and get rid of pee (urine) in the body. Follow these instructions at home:  Take over-the-counter and prescription medicines only as told by your doctor.  If you were prescribed an antibiotic medicine, take it as told by your doctor. Do not stop taking the antibiotic even if you start to feel better.  Avoid the following drinks: ? Alcohol. ? Caffeine. ? Tea. ? Carbonated drinks.  Drink enough fluid to keep your pee clear or pale yellow.  Keep all follow-up visits as told by your doctor. This is important.  Make sure to: ? Empty your bladder often and completely. Do not to hold pee for long periods of time. ? Empty your bladder before and after sex. ? Wipe from front to back after a bowel movement if you are female. Use each tissue one time when you wipe. Contact a doctor if:  You have back pain.  You have a fever.  You feel sick to your stomach (nauseous).  You throw up (vomit).  Your symptoms do not get better after 3 days.  Your symptoms go away and then come back. Get help right away if:  You have very bad back pain.  You have very bad lower belly (abdominal) pain.  You are throwing up and cannot keep down any medicines or water. This information is not intended to replace advice given to you by your health care provider. Make sure you discuss any questions you have with your health care provider. Document Released: 09/06/2007  Document Revised: 08/26/2015 Document Reviewed: 02/08/2015 Elsevier Interactive Patient Education  [employer].

## 2017-11-01 NOTE — Patient Instructions (Signed)
Description   Continue on same dosage 2 tablets daily except 1 tablet on Fridays.  Recheck in 6 weeks.

## 2017-11-02 ENCOUNTER — Telehealth: Payer: Self-pay | Admitting: *Deleted

## 2017-11-02 LAB — URINE CULTURE
MICRO NUMBER:: 90912565
SPECIMEN QUALITY:: ADEQUATE

## 2017-11-02 NOTE — Telephone Encounter (Signed)
Patient saw [facility] for an acute visit this week and prescribed Cranberry 450mg  twice daily. Pharmacy called and stated that they wanted to let PCP know that there is a drug interaction with it and the [place]. It causes Increase Bleeding. Please Advise.

## 2017-11-04 NOTE — Telephone Encounter (Signed)
Noted - do not take the cranberry tabs

## 2017-11-05 ENCOUNTER — Other Ambulatory Visit (HOSPITAL_COMMUNITY): Payer: Self-pay | Admitting: Radiology

## 2017-11-05 ENCOUNTER — Encounter (HOSPITAL_COMMUNITY): Payer: Self-pay | Admitting: Interventional Radiology

## 2017-11-05 ENCOUNTER — Ambulatory Visit (HOSPITAL_COMMUNITY)
Admission: RE | Admit: 2017-11-05 | Discharge: 2017-11-05 | Disposition: A | Discharge status: Home / Self Care | Payer: Medicare Other | Source: Ambulatory Visit | Attending: Radiology | Admitting: Radiology

## 2017-11-05 DIAGNOSIS — Y733 Surgical instruments, materials and gastroenterology and urology devices (including sutures) associated with adverse incidents: Secondary | ICD-10-CM | POA: Insufficient documentation

## 2017-11-05 DIAGNOSIS — R633 Feeding difficulties, unspecified: Secondary | ICD-10-CM

## 2017-11-05 DIAGNOSIS — K9423 Gastrostomy malfunction: Secondary | ICD-10-CM | POA: Insufficient documentation

## 2017-11-05 DIAGNOSIS — R1310 Dysphagia, unspecified: Secondary | ICD-10-CM | POA: Insufficient documentation

## 2017-11-05 DIAGNOSIS — K9413 Enterostomy malfunction: Secondary | ICD-10-CM | POA: Diagnosis not present

## 2017-11-05 HISTORY — PX: IR CM INJ ANY COLONIC TUBE W/FLUORO: IMG2336

## 2017-11-05 MED ORDER — IOPAMIDOL (ISOVUE-300) INJECTION 61%
INTRAVENOUS | Status: AC
  Administered 2017-11-05: 10 mL
  Filled 2017-11-05: qty 50

## 2017-11-05 MED ORDER — IOPAMIDOL (ISOVUE-300) INJECTION 61%
50.0000 mL | Freq: Once | INTRAVENOUS | Status: AC | PRN
  Administered 2017-11-05: 10 mL

## 2017-11-05 NOTE — Telephone Encounter (Signed)
Pharmacy notified.  LMOM for patient to return call.

## 2017-11-05 NOTE — Procedures (Signed)
Pre procedural Dx: Dysphagia, leaking GJ tube. Post procedural Dx: Same  Appropriately positioned and functioning GJ tube. No exchange performed.  EBL: None  Complications: None immediate.  Ronny Bacon, MD Pager #: [phone]

## 2017-11-06 ENCOUNTER — Other Ambulatory Visit: Payer: Medicare Other

## 2017-11-06 ENCOUNTER — Telehealth: Payer: Self-pay | Admitting: *Deleted

## 2017-11-06 DIAGNOSIS — R399 Unspecified symptoms and signs involving the genitourinary system: Secondary | ICD-10-CM

## 2017-11-06 NOTE — Telephone Encounter (Signed)
Order for repeat UA and UCx

## 2017-11-07 LAB — URINALYSIS, ROUTINE W REFLEX MICROSCOPIC
Bacteria, UA: NONE SEEN /HPF
Bilirubin Urine: NEGATIVE
Glucose, UA: NEGATIVE
Hgb urine dipstick: NEGATIVE
Ketones, ur: NEGATIVE
Nitrite: NEGATIVE
Protein, ur: NEGATIVE
Specific Gravity, Urine: 1.013 (ref 1.001–1.03)
pH: 7.5 (ref 5.0–8.0)

## 2017-11-07 LAB — URINE CULTURE
MICRO NUMBER:: 90929914
SPECIMEN QUALITY:: ADEQUATE

## 2017-11-12 ENCOUNTER — Encounter: Payer: Self-pay | Admitting: Podiatry

## 2017-11-12 ENCOUNTER — Ambulatory Visit (INDEPENDENT_AMBULATORY_CARE_PROVIDER_SITE_OTHER): Payer: Medicare Other | Admitting: Podiatry

## 2017-11-12 ENCOUNTER — Ambulatory Visit: Payer: Medicare Other | Admitting: Podiatry

## 2017-11-12 DIAGNOSIS — L97512 Non-pressure chronic ulcer of other part of right foot with fat layer exposed: Secondary | ICD-10-CM | POA: Diagnosis not present

## 2017-11-12 DIAGNOSIS — I70235 Atherosclerosis of native arteries of right leg with ulceration of other part of foot: Secondary | ICD-10-CM

## 2017-11-12 DIAGNOSIS — E0859 Diabetes mellitus due to underlying condition with other circulatory complications: Secondary | ICD-10-CM

## 2017-11-14 ENCOUNTER — Other Ambulatory Visit: Payer: Self-pay | Admitting: Internal Medicine

## 2017-11-14 DIAGNOSIS — K922 Gastrointestinal hemorrhage, unspecified: Secondary | ICD-10-CM

## 2017-11-15 NOTE — Progress Notes (Signed)
Subjective:  84 year old female presenting today for follow up evaluation of ulcerations to the right foot. She states she believes the ulcers are improving. She has been using betadine and wearing the post op shoe as directed. She denies modifying factors. Patient is here for further evaluation and treatment.   Past Medical History:  Diagnosis Date  . Acute respiratory failure ([place])   . Anemia    previous blood transfusions  . Arthritis    "all over"  . Asthma   . Bradycardia    requiring previous d/c of BB and reduction of amiodarone  . CAD (coronary artery disease)    nonobstructive per notes  . Chronic diastolic CHF (congestive heart failure) ([place])   . CKD (chronic kidney disease), stage III ([place])   . Complication of blood transfusion    "got the wrong blood type at [facility] in ~ 2015; no adverse reaction that we are aware of"/daughter, [name] (01/27/2016)  . COPD (chronic obstructive pulmonary disease) ([place])   . Depression    "light case"  . DVT (deep venous thrombosis) ([place]) 01/2016   a. LLE DVT 01/2016 - switched from Eliquis to Coumadin.  . Gastric stenosis    a. s/p stomach tube  . GERD (gastroesophageal reflux disease)   . History of blood transfusion    "several" (01/27/2016)  . History of stomach ulcers   . Hyperlipidemia   . Hypertension   . Hypothyroidism   . On home oxygen therapy    "2L; 9PM til 9AM" (06/27/2017)  . Paraesophageal hernia   . Perforated gastric ulcer ([place])   . Pneumonia    "a few times" (06/27/2017)  . Seasonal allergies   . SIADH (syndrome of inappropriate ADH production) ([place])    [name] 01/10/2015  . Small bowel obstruction ([place])    "I don't know how many" (01/11/2015)  . Stroke ([place])    "light one"  . Type II diabetes mellitus ([place])    "related to prednisone use  for > 20 yr; once predinose stopped; no more DM RX" (01/27/2016)  . UTI (urinary tract infection) 02/08/2016  . Ventral hernia with bowel obstruction         Objective/Physical Exam General: The patient is alert and oriented x3 in no acute distress.  Dermatology:  Wound #1 noted to the right hallux measuring 1.5 x 1.0 x 0.1 cm (LxWxD).    To the noted ulceration(s), there is no eschar. There is a moderate amount of slough, fibrin, and necrotic tissue noted. Granulation tissue and wound base is red. There is a minimal amount of serosanguineous drainage noted. There is no exposed bone muscle-tendon ligament or joint. There is no malodor. Periwound integrity is intact. Skin is warm, dry and supple bilateral lower extremities.  Wound noted to the right fourth toe has healed. Complete re-epithelialization has occurred. No drainage noted.   Vascular: Palpable pedal pulses bilaterally. No edema or erythema noted. Capillary refill within normal limits.  Neurological: Epicritic and protective threshold diminished bilaterally.   Musculoskeletal Exam: Range of motion within normal limits to all pedal and ankle joints bilateral. Muscle strength 5/5 in all groups bilateral.   Assessment: #1 ulceration of the right hallux secondary to diabetes mellitus  #2 ulceration of the right fourth toe secondary to diabetes mellitus - healed  #3 diabetes mellitus w/ peripheral neuropathy   Plan of Care:  #1 Patient was evaluated. #2 medically necessary excisional debridement including subcutaneous tissue was performed using a tissue nipper and a chisel  blade. Excisional debridement of all the necrotic nonviable tissue down to healthy bleeding viable tissue was performed with post-debridement measurements same as pre-. #3 the wound was cleansed and dry sterile dressing applied. #4 Continue using betadine daily with a dry sterile dressing.  #5 Continue weightbearing in post op shoe.  #6 Return to clinic in 4 weeks.    Edrick Kins, DPM [facility]  Dr. Edrick Kins, [address]                                         [place], [place] [address]                Office [phone]  Fax [phone]

## 2017-11-20 IMAGING — DX DG CHEST 2V
2 series · 2 of 2 positions shown · non-contrast
Comparison: 01/27/2015

CLINICAL DATA: Pt here with cough that started last week, hx of
bronchitis last year

EXAM:
CHEST  2 VIEW

[chest pa]
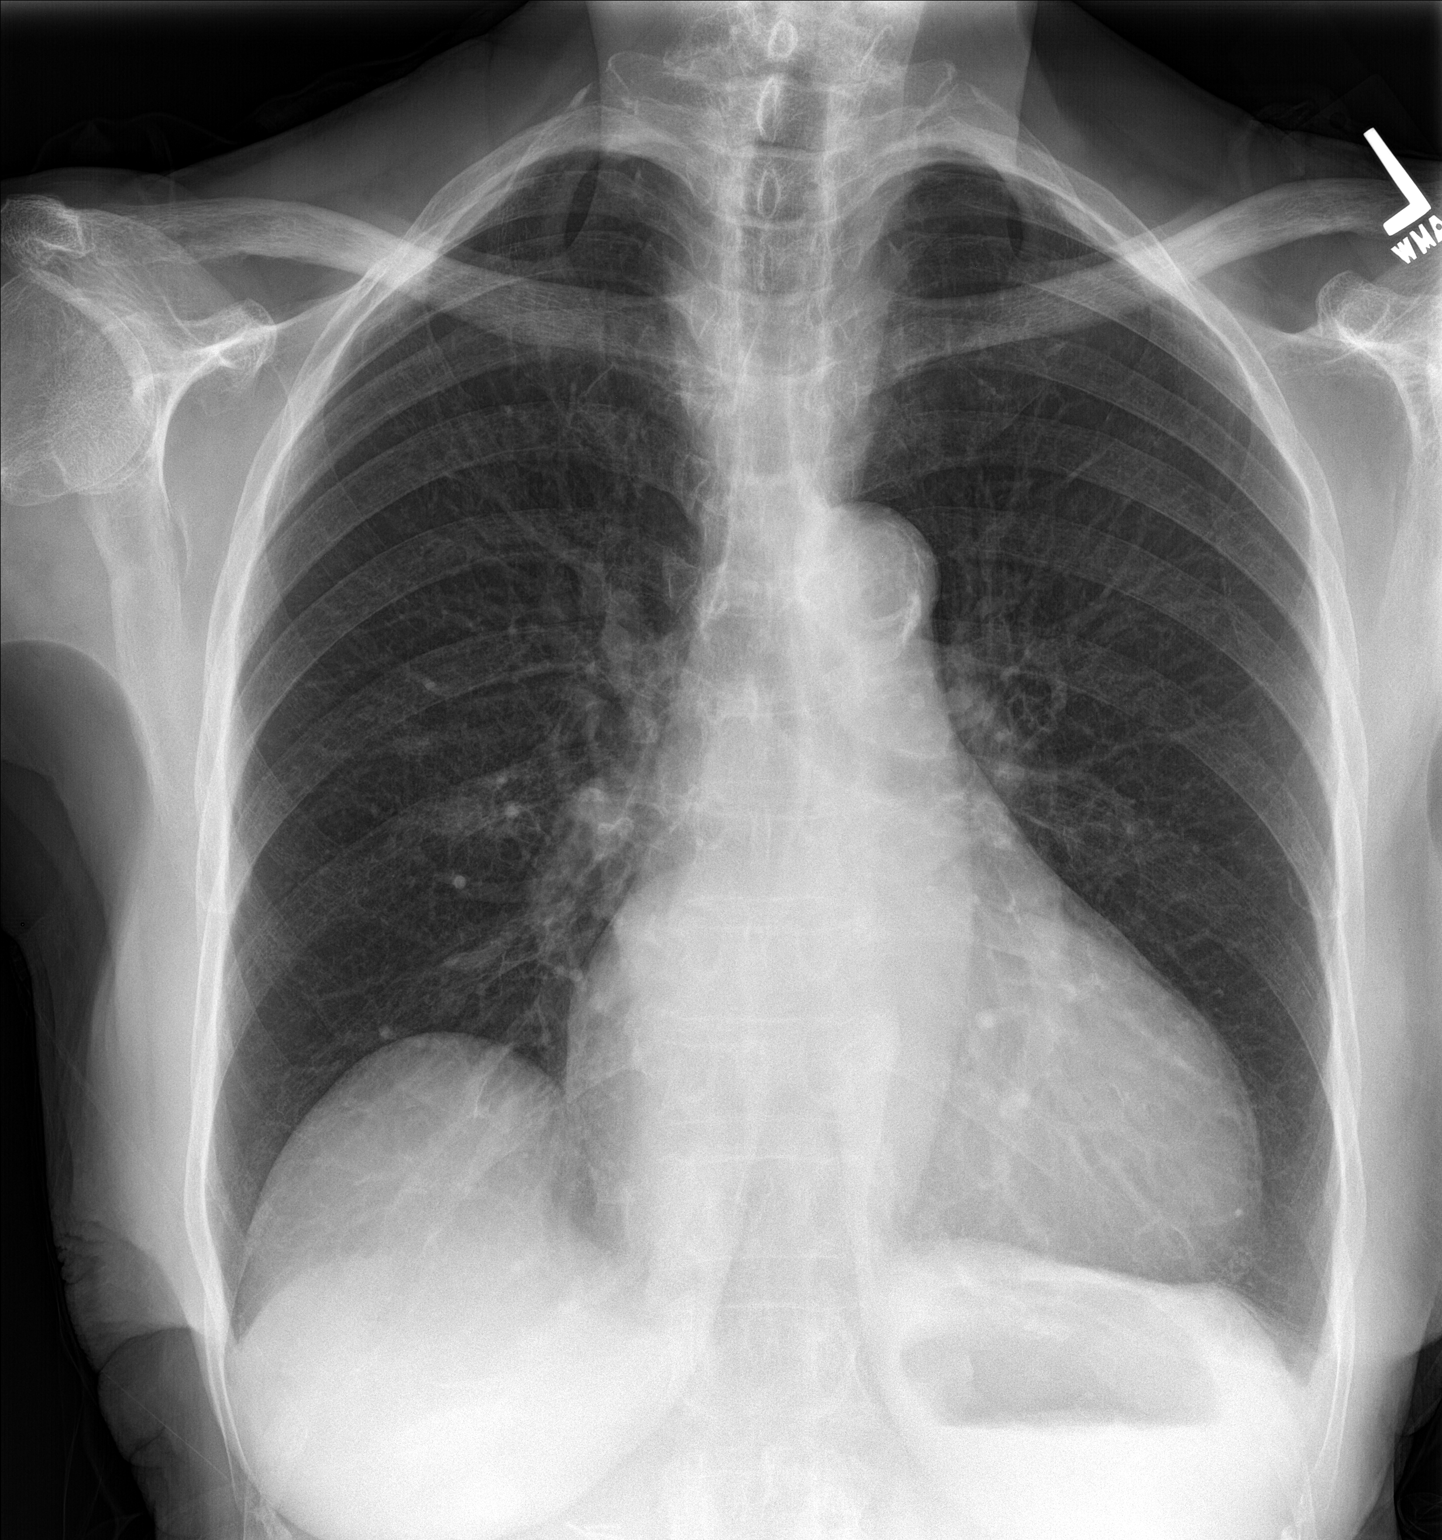

[chest lat]
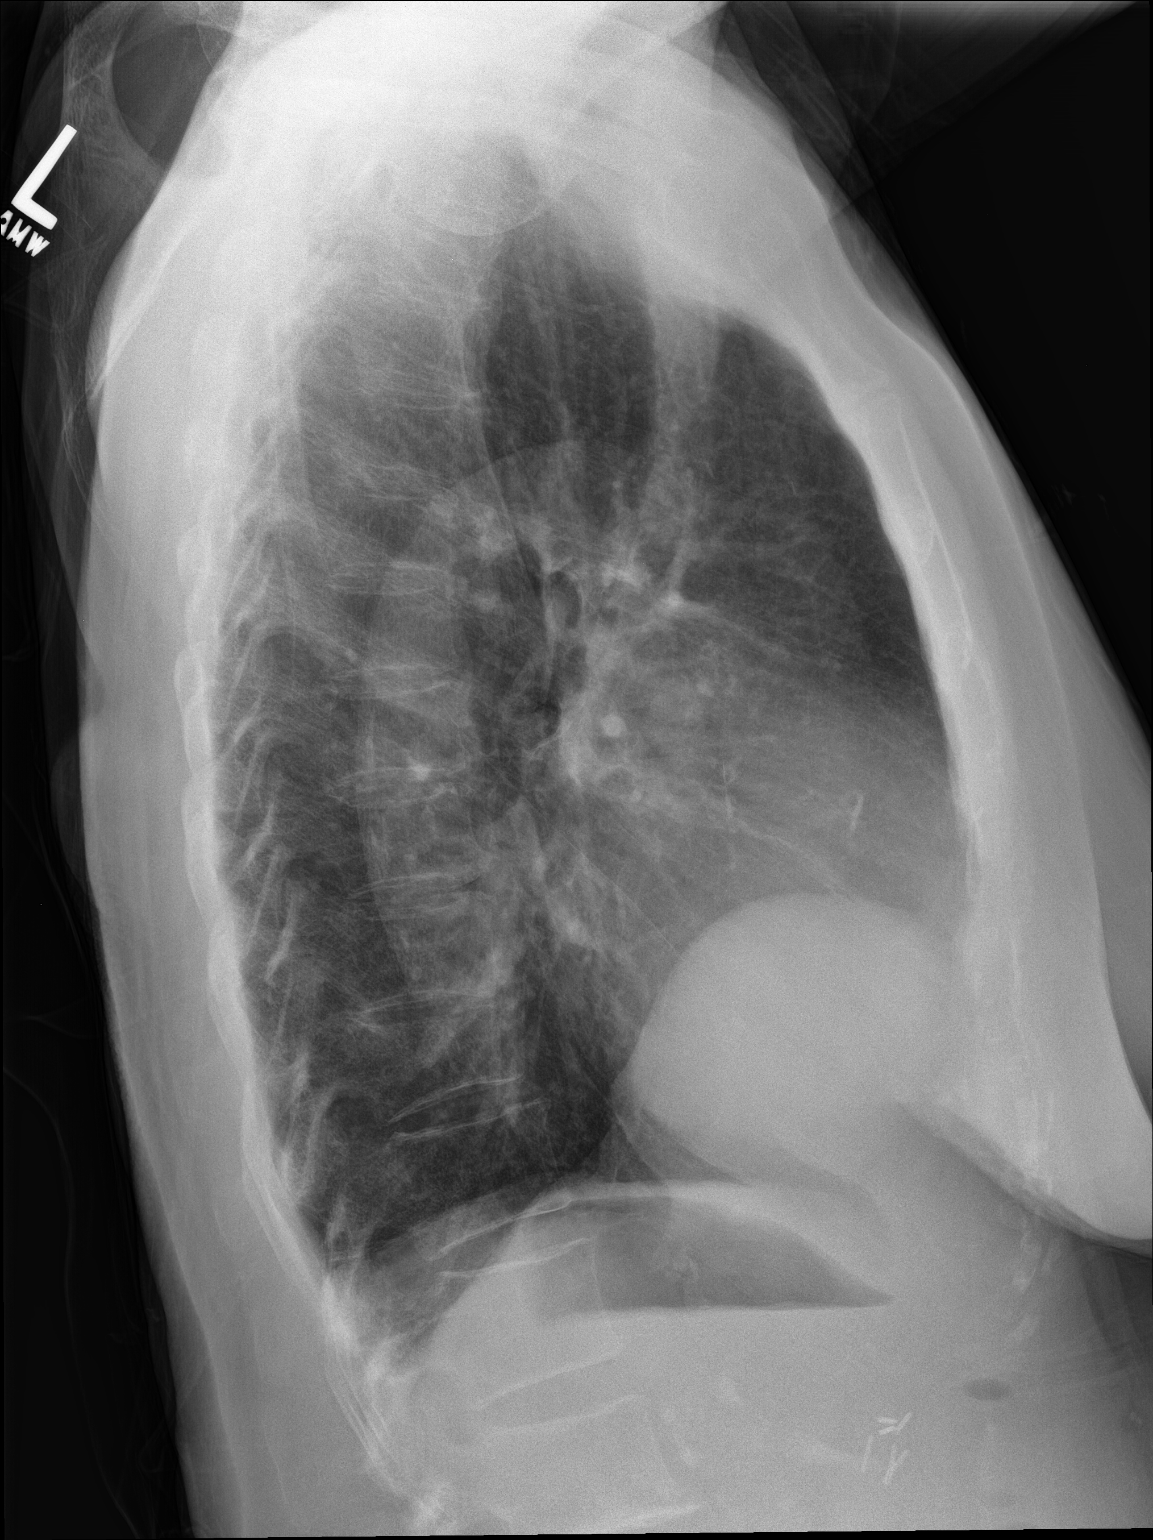

[2 of 2 positions shown; findings below may reference images not displayed]

FINDINGS: The heart is enlarged. No focal consolidations or pleural effusions.
Stable elevation of right hemidiaphragm. Surgical clips are noted in
the upper abdomen.
IMPRESSION: Stable cardiomegaly.

## 2017-11-20 IMAGING — CR DG CHEST 2V
3 series · 3 of 3 positions shown · non-contrast
Comparison: 06/20/2015, [DATE] hours

CLINICAL DATA: Shortness of breath and cough

EXAM:
CHEST  2 VIEW

[chest lat (1 of 2)]
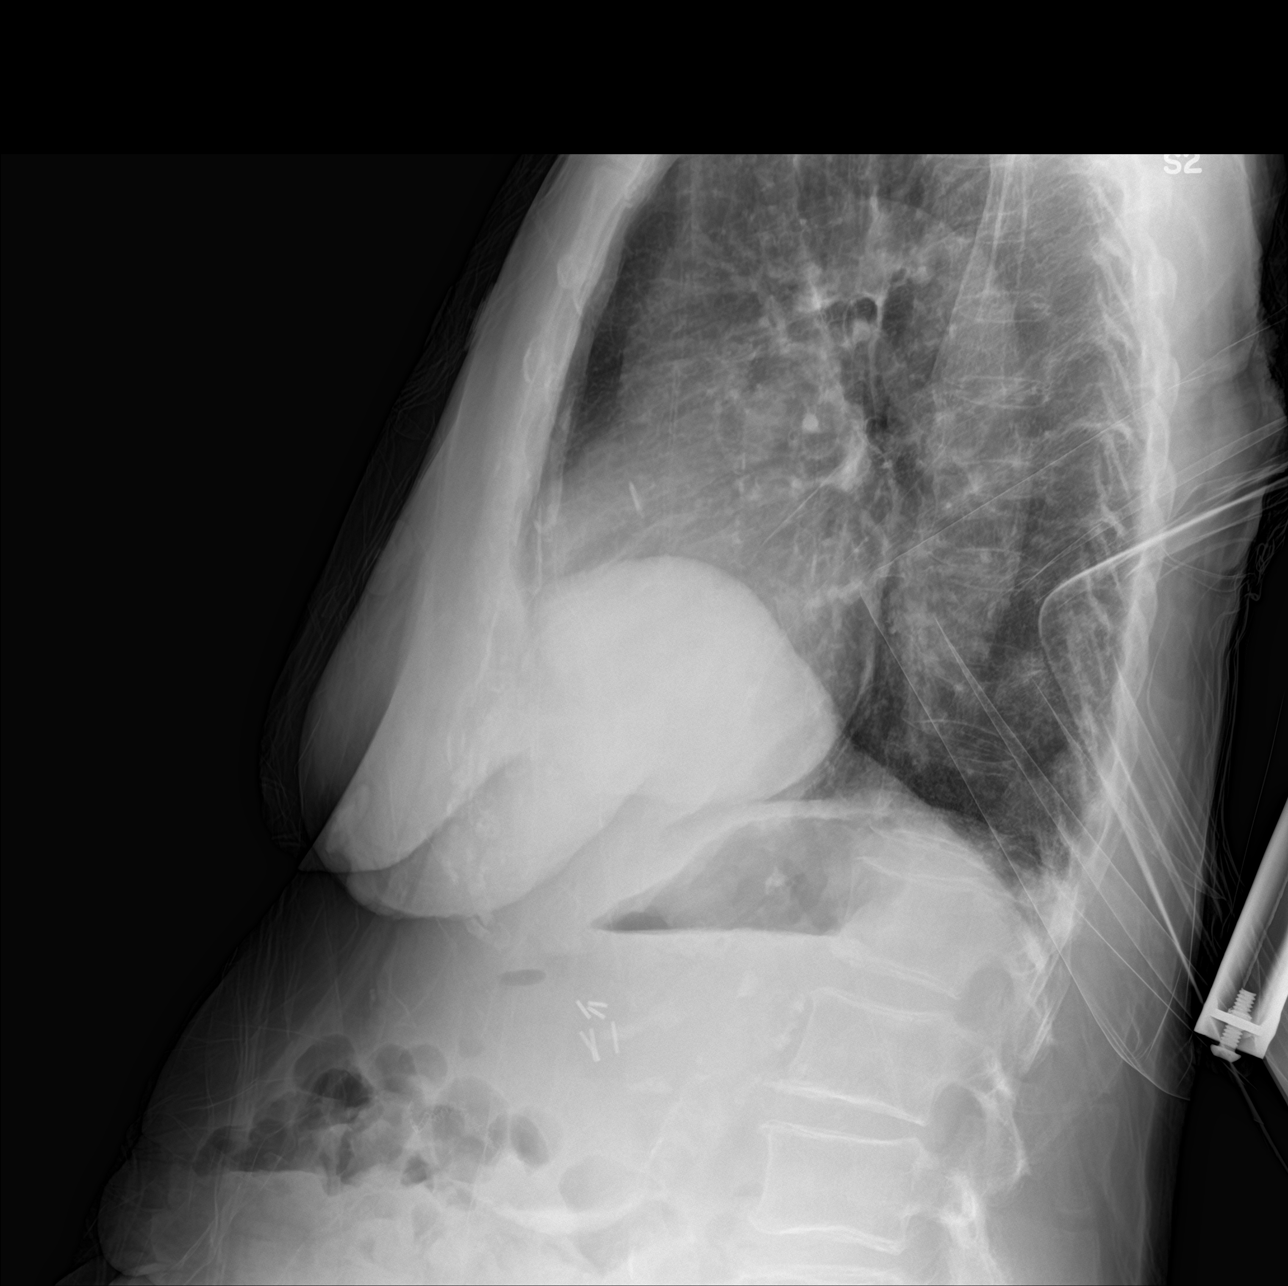

[chest ap]
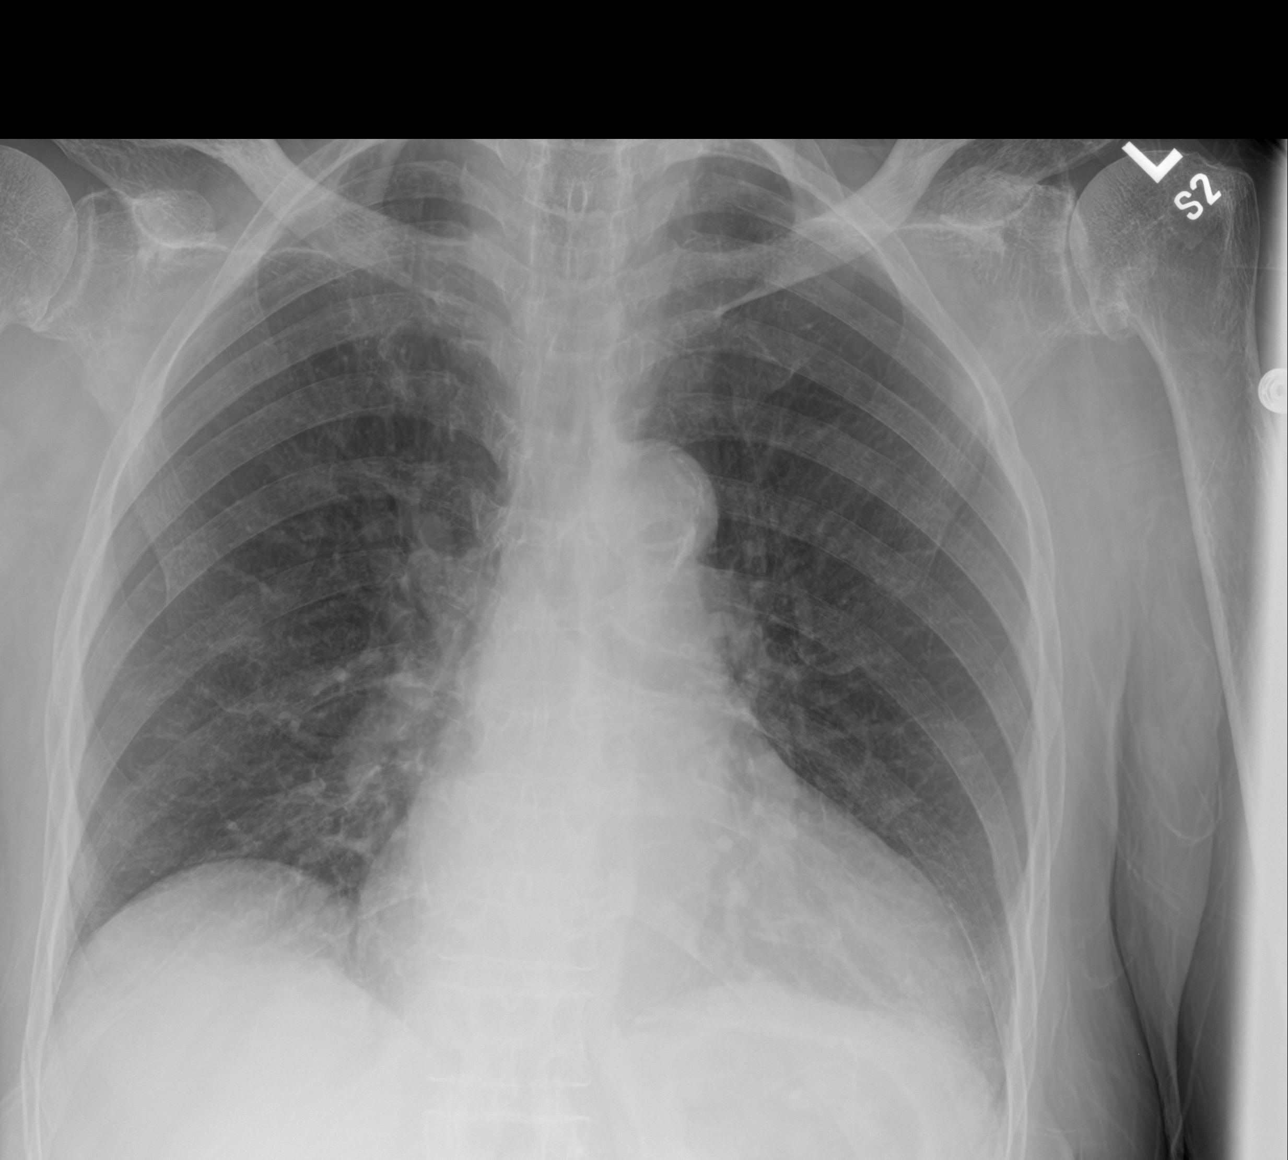

[chest lat (2 of 2)]
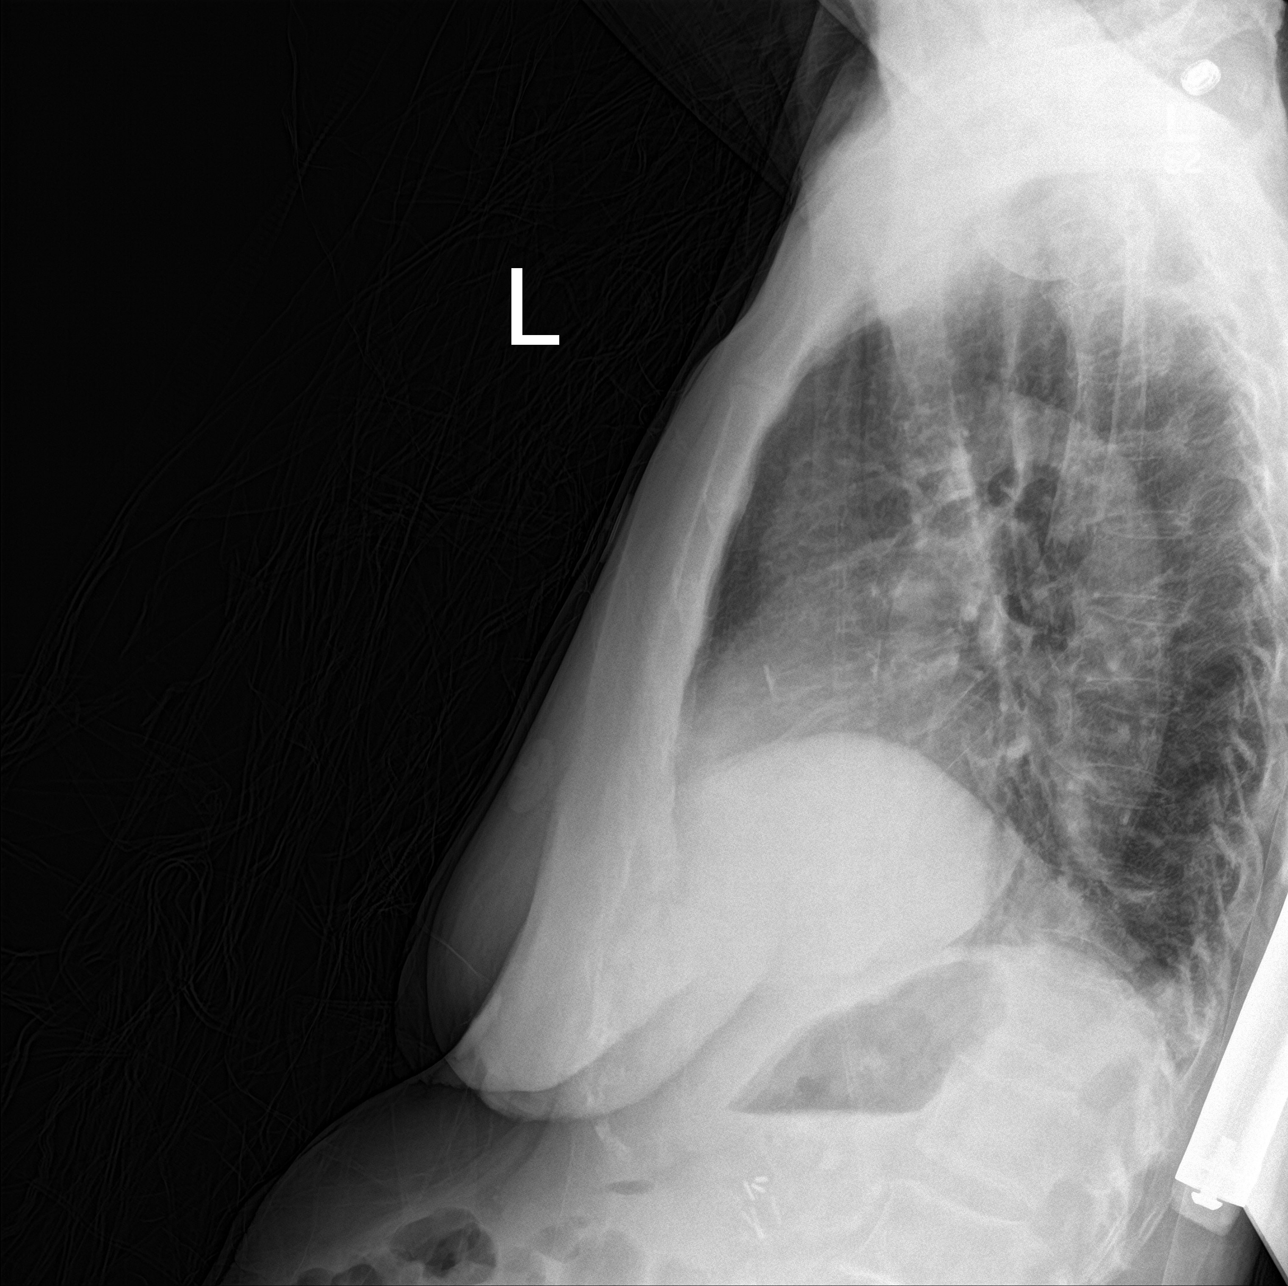

[3 of 3 positions shown; findings below may reference images not displayed]

FINDINGS: Cardiac shadow is again mildly enlarged. Lungs are well aerated
bilaterally. No acute bony abnormality is seen. Stable compression
deformity is noted in the lower thoracic spine.
IMPRESSION: No acute abnormality noted.  No change from 2 hours previous

## 2017-11-21 ENCOUNTER — Encounter: Payer: Self-pay | Admitting: Internal Medicine

## 2017-11-21 ENCOUNTER — Telehealth: Payer: Self-pay | Admitting: *Deleted

## 2017-11-21 NOTE — Telephone Encounter (Signed)
ok 

## 2017-11-21 NOTE — Telephone Encounter (Signed)
Can she be seen at wound care center OR can home health RN evaluate her?

## 2017-11-21 NOTE — Telephone Encounter (Signed)
Patient does not have Home health. Daughter is going to call the [facility] for an appointment since they have been there before.

## 2017-11-21 NOTE — Telephone Encounter (Signed)
Shannon [name], daughter called and stated that patient has an Ulcer at the feeding tube on her stomach left side. Stated that it is red and beginning to open up. Requested an appointment to be seen but no availability until Monday with Dr. Linna Darner. Daughter stated that she does not want it to get worse or infected. Please Advise.

## 2017-12-04 ENCOUNTER — Encounter: Payer: Self-pay | Admitting: Internal Medicine

## 2017-12-04 ENCOUNTER — Ambulatory Visit (INDEPENDENT_AMBULATORY_CARE_PROVIDER_SITE_OTHER): Payer: Medicare Other | Admitting: Internal Medicine

## 2017-12-04 VITALS — BP 134/68 | HR 70 | Temp 97.9°F | Ht 66.0 in | Wt 167.8 lb

## 2017-12-04 DIAGNOSIS — I70262 Atherosclerosis of native arteries of extremities with gangrene, left leg: Secondary | ICD-10-CM

## 2017-12-04 DIAGNOSIS — I739 Peripheral vascular disease, unspecified: Secondary | ICD-10-CM | POA: Diagnosis not present

## 2017-12-04 DIAGNOSIS — N183 Chronic kidney disease, stage 3 unspecified: Secondary | ICD-10-CM

## 2017-12-04 DIAGNOSIS — I481 Persistent atrial fibrillation: Secondary | ICD-10-CM

## 2017-12-04 DIAGNOSIS — E11621 Type 2 diabetes mellitus with foot ulcer: Secondary | ICD-10-CM | POA: Diagnosis not present

## 2017-12-04 DIAGNOSIS — E119 Type 2 diabetes mellitus without complications: Secondary | ICD-10-CM

## 2017-12-04 DIAGNOSIS — Z23 Encounter for immunization: Secondary | ICD-10-CM

## 2017-12-04 DIAGNOSIS — L98491 Non-pressure chronic ulcer of skin of other sites limited to breakdown of skin: Secondary | ICD-10-CM | POA: Diagnosis not present

## 2017-12-04 DIAGNOSIS — L97513 Non-pressure chronic ulcer of other part of right foot with necrosis of muscle: Secondary | ICD-10-CM

## 2017-12-04 DIAGNOSIS — Z931 Gastrostomy status: Secondary | ICD-10-CM | POA: Diagnosis not present

## 2017-12-04 DIAGNOSIS — M150 Primary generalized (osteo)arthritis: Secondary | ICD-10-CM | POA: Diagnosis not present

## 2017-12-04 DIAGNOSIS — I4819 Other persistent atrial fibrillation: Secondary | ICD-10-CM

## 2017-12-04 DIAGNOSIS — M8949 Other hypertrophic osteoarthropathy, multiple sites: Secondary | ICD-10-CM

## 2017-12-04 DIAGNOSIS — M159 Polyosteoarthritis, unspecified: Secondary | ICD-10-CM

## 2017-12-04 NOTE — Patient Instructions (Addendum)
Follow up with podiatry for foot care  Take pain medicine as needed for severe pain. May take stool softener with pain med to prevent constipation  Flu shot given today  Follow up with wound care center as scheduled - continue current wound care until seen by wound care  Continue current medication as ordered

## 2017-12-04 NOTE — Progress Notes (Signed)
Patient ID: Shannon [name], female   DOB: 02-10-1924, 84 y.o.   MRN: [id number]   Location:  [facility] OFFICE  Provider: DR Elmon Kirschner  Code Status:  Goals of Care:  Advanced Directives 10/05/2017  Does Patient Have a Medical Advance Directive? Yes  Type of Advance Directive Healthcare Power of Attorney  Does patient want to make changes to medical advance directive? No - Patient declined  Copy of Healthcare Power of Attorney in Chart? Yes  Would patient like information on creating a medical advance directive? -     Chief Complaint  Patient presents with  . Medical Management of Chronic Issues    Pt is being seen for a 4 month routine visit.   . Audit C Screening    Score of 0  . Health Maintenance    Pt is due for a foot exam    HPI: Patient is a 84 y.o. female seen today for medical management of chronic diseases.  She is scheduled to see wound center for ulcer that formed at FT. She is applying Butt paste with zinc that helps. She does have some leaking around the tube at times.  She had Peg tube declogged yesterday by IR. No issues since then with TF  She continues to have severe neck pain. She has not had neck imaging recently.  HTN/afib/aflutter/hyperlipidemia/chronic dHF - BP stable on hydralazine, coreg and imdur. Cholesterol stable on lipitor. Rate controlled on coreg.  She takes coumadin for anticoagulation. INR Followed by cardio Dr Delton See. GOAL INR 2-3. EF 60-65%  GOO - s/p GJ tube. FT changed by IR. Takes Protonix BID. Constipation stable on lactulose. She gets TF at night (osmolite 1.5 cal 237 ml or 3.3 cans per day with promod 3 oz per day @ 50ml.hr x 16hrs) and free water flushes (230 ml) BID. Drinks liquids po. Takes zofran prn. Albumin improved at 3.5  Hyponatremia/ hx SIADH- stable. she was taking salt tabs but noticed the entire tab in her stool intermittently and no longer takes it. Na 137  DM - previously diet controlled but now on insulin tx (novolog SSI and  lantus qhs). A1c 7%. Takes gabapentin for neuropathy; LDL 23  Hx CVA - stable. Takes coumadin daily. No bloody stools. INR Goal 2-3  Depression - mood stable on sertraline  Arthritis - stable on roxicodone. She does take gabapentin for neuropathy. Tramadol is ineffective and she had increased confusion on norco. Tylenol ineffective.  Hypothyroidism - stable on levothyroxine 137 mcg daily. TSH 0.67  Hx Pressure sore/right 3rd toe osteomyelitis -she noticed more bleeding from right toe and plans to f/u with podiatry. She completed 1 month of augmentin for osteo. 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 Followed by podiatry.   PAD - stable. s/p left SFA stent 09/21/16. ABI on 09/24/16 showed right 0.86; left 0.96. followed by vascular sx.  COPD/asthma -  stable on dulera, xopenex hfa, duonebs. Followed by pulmonary.   Past Medical History:  Diagnosis Date  . Acute respiratory failure (HCC)   . Anemia    previous blood transfusions  . Arthritis    "all over"  . Asthma   . Bradycardia    requiring previous d/c of BB and reduction of amiodarone  . CAD (coronary artery disease)    nonobstructive per notes  . Chronic diastolic CHF (congestive heart failure) (HCC)   . CKD (chronic kidney disease), stage III (HCC)   . Complication of blood  transfusion    "got the wrong blood type at [facility] in ~ 2015; no adverse reaction that we are aware of"/daughter, [name] (01/27/2016)  . COPD (chronic obstructive pulmonary disease) (HCC)   . Depression    "light case"  . DVT (deep venous thrombosis) (HCC) 01/2016   a. LLE DVT 01/2016 - switched from Eliquis to Coumadin.  . Gastric stenosis    a. s/p stomach tube  . GERD (gastroesophageal reflux disease)   . History of blood transfusion    "several" (01/27/2016)  . History of stomach ulcers   . Hyperlipidemia   . Hypertension   . Hypothyroidism   . On home oxygen therapy    "2L; 9PM til 9AM" (06/27/2017)  .  Paraesophageal hernia   . Perforated gastric ulcer (HCC)   . Pneumonia    "a few times" (06/27/2017)  . Seasonal allergies   . SIADH (syndrome of inappropriate ADH production) (HCC)    [name] 01/10/2015  . Small bowel obstruction (HCC)    "I don't know how many" (01/11/2015)  . Stroke (HCC)    "light one"  . Type II diabetes mellitus (HCC)    "related to prednisone use  for > 20 yr; once predinose stopped; no more DM RX" (01/27/2016)  . UTI (urinary tract infection) 02/08/2016  . Ventral hernia with bowel obstruction     Past Surgical History:  Procedure Laterality Date  . ABDOMINAL AORTOGRAM N/A 09/21/2016   Procedure: Abdominal Aortogram;  Surgeon: Maeola Harman, MD;  Location: [facility] INVASIVE CV LAB;  Service: Cardiovascular;  Laterality: N/A;  . AMPUTATION Left 09/25/2016   Procedure: AMPUTATION DIGIT- LEFT 2ND AND 3RD TOES;  Surgeon: Larina Earthly, MD;  Location: [facility] OR;  Service: Vascular;  Laterality: Left;  . CATARACT EXTRACTION W/ INTRAOCULAR LENS  IMPLANT, BILATERAL    . CHOLECYSTECTOMY OPEN    . COLECTOMY    . ESOPHAGOGASTRODUODENOSCOPY N/A 01/19/2014   Procedure: ESOPHAGOGASTRODUODENOSCOPY (EGD);  Surgeon: Hilarie Fredrickson, MD;  Location: [facility] ENDOSCOPY;  Service: Endoscopy;  Laterality: N/A;  . ESOPHAGOGASTRODUODENOSCOPY N/A 01/20/2014   Procedure: ESOPHAGOGASTRODUODENOSCOPY (EGD);  Surgeon: Hilarie Fredrickson, MD;  Location: [facility] ENDOSCOPY;  Service: Endoscopy;  Laterality: N/A;  . ESOPHAGOGASTRODUODENOSCOPY N/A 03/19/2014   Procedure: ESOPHAGOGASTRODUODENOSCOPY (EGD);  Surgeon: Rachael Fee, MD;  Location: [facility]COPY;  Service: Endoscopy;  Laterality: N/A;  . ESOPHAGOGASTRODUODENOSCOPY N/A 07/08/2015   Procedure: ESOPHAGOGASTRODUODENOSCOPY (EGD);  Surgeon: Sherrilyn Rist, MD;  Location: [facility] ENDOSCOPY;  Service: Endoscopy;  Laterality: N/A;  . ESOPHAGOGASTRODUODENOSCOPY (EGD) WITH PROPOFOL N/A 09/15/2015   Procedure: ESOPHAGOGASTRODUODENOSCOPY (EGD) WITH PROPOFOL;  Surgeon:  Ruffin Frederick, MD;  Location: [facility] ENDOSCOPY;  Service: Gastroenterology;  Laterality: N/A;  . GASTROJEJUNOSTOMY     hx/notes 01/10/2015  . GASTROJEJUNOSTOMY N/A 09/23/2015   Procedure: OPEN GASTROJEJUNOSTOMY TUBE PLACEMENT;  Surgeon: De Blanch Kinsinger, MD;  Location: [facility] ORS;  Service: General;  Laterality: N/A;  . GLAUCOMA SURGERY Bilateral   . HERNIA REPAIR  2015  . IR CM INJ ANY COLONIC TUBE W/FLUORO  01/05/2017  . IR CM INJ ANY COLONIC TUBE W/FLUORO  06/07/2017  . IR CM INJ ANY COLONIC TUBE W/FLUORO  11/05/2017  . IR GENERIC HISTORICAL  01/07/2016   IR GJ TUBE CHANGE 01/07/2016 Malachy Moan, MD [facility]  . IR GENERIC HISTORICAL  01/27/2016   IR MECH REMOV OBSTRUC MAT ANY COLON TUBE W/FLUORO 01/27/2016 Richarda Overlie, MD [facility]  . IR GENERIC HISTORICAL  02/07/2016   IR PATIENT EVAL TECH  0-60 MINS Darrell K Allred, PA-C [facility]  . IR GENERIC HISTORICAL  02/08/2016   IR GJ TUBE CHANGE 02/08/2016 Berdine Dance, MD [facility]  . IR GENERIC HISTORICAL  01/06/2016   IR GJ TUBE CHANGE 01/06/2016 [facility] OUT REF  . IR GENERIC HISTORICAL  05/02/2016   IR CM INJ ANY COLONIC TUBE W/FLUORO 05/02/2016 Oley Balm, MD [facility]  . IR GENERIC HISTORICAL  05/15/2016   IR GJ TUBE CHANGE 05/15/2016 Simonne Come, MD [facility]  . IR GENERIC HISTORICAL  06/28/2016   IR GJ TUBE CHANGE 06/28/2016 [facility]  . IR GJ TUBE CHANGE  02/20/2017  . IR GJ TUBE CHANGE  05/10/2017  . IR GJ TUBE CHANGE  07/06/2017  . IR GJ TUBE CHANGE  08/02/2017  . IR GJ TUBE CHANGE  10/15/2017  . IR PATIENT EVAL TECH 0-60 MINS  10/19/2016  . IR PATIENT EVAL TECH 0-60 MINS  12/25/2016  . IR PATIENT EVAL TECH 0-60 MINS  01/29/2017  . IR PATIENT EVAL TECH 0-60 MINS  04/04/2017  . IR PATIENT EVAL TECH 0-60 MINS  04/30/2017  . IR PATIENT EVAL TECH 0-60 MINS  07/31/2017  . IR REPLC DUODEN/JEJUNO TUBE PERCUT W/FLUORO  11/14/2016  . LAPAROTOMY N/A 01/20/2015   Procedure: EXPLORATORY LAPAROTOMY;  Surgeon: Abigail Miyamoto,  MD;  Location: [facility] OR;  Service: General;  Laterality: N/A;  . LOWER EXTREMITY ANGIOGRAPHY Left 09/21/2016   Procedure: Lower Extremity Angiography;  Surgeon: Maeola Harman, MD;  Location: [facility] INVASIVE CV LAB;  Service: Cardiovascular;  Laterality: Left;  . LOWER EXTREMITY ANGIOGRAPHY Right 06/27/2017   Procedure: Lower Extremity Angiography;  Surgeon: Maeola Harman, MD;  Location: [facility] INVASIVE CV LAB;  Service: Cardiovascular;  Laterality: Right;  . LYSIS OF ADHESION N/A 01/20/2015   Procedure: LYSIS OF ADHESIONS < 1 HOUR;  Surgeon: Abigail Miyamoto, MD;  Location: [facility] OR;  Service: General;  Laterality: N/A;  . PERIPHERAL VASCULAR BALLOON ANGIOPLASTY Left 09/21/2016   Procedure: Peripheral Vascular Balloon Angioplasty;  Surgeon: Maeola Harman, MD;  Location: [facility] INVASIVE CV LAB;  Service: Cardiovascular;  Laterality: Left;  drug coated balloon  . PERIPHERAL VASCULAR BALLOON ANGIOPLASTY Right 06/27/2017   Procedure: PERIPHERAL VASCULAR BALLOON ANGIOPLASTY;  Surgeon: Maeola Harman, MD;  Location: [facility] INVASIVE CV LAB;  Service: Cardiovascular;  Laterality: Right;  SFA/TPTRUNK  . TONSILLECTOMY    . TUBAL LIGATION    . VENTRAL HERNIA REPAIR  2015   incarcerated ventral hernia ([facility] 09/2013)/notes 01/10/2015     reports that she has never smoked. She has quit using smokeless tobacco.  Her smokeless tobacco use included snuff. She reports that she does not drink alcohol or use drugs. Social History   Socioeconomic History  . Marital status: Widowed    Spouse name: Not on file  . Number of children: 3  . Years of education: Not on file  . Highest education level: Not on file  Occupational History  . Not on file  Social Needs  . Financial resource strain: Not hard at all  . Food insecurity:    Worry: Never true    Inability: Never true  . Transportation needs:    Medical: No    Non-medical: No  Tobacco Use  . Smoking status: Never Smoker  . Smokeless  tobacco: Former Neurosurgeon    Types: Snuff  . Tobacco comment: "used snuff in my younger days"  Substance and Sexual Activity  . Alcohol use: Never    Alcohol/week: 0.0 standard  drinks    Frequency: Never  . Drug use: Never  . Sexual activity: Not Currently  Lifestyle  . Physical activity:    Days per week: 0 days    Minutes per session: 0 min  . Stress: Not at all  Relationships  . Social connections:    Talks on phone: More than three times a week    Gets together: More than three times a week    Attends religious service: Never    Active member of club or organization: No    Attends meetings of clubs or organizations: Never    Relationship status: Widowed  . Intimate partner violence:    Fear of current or ex partner: No    Emotionally abused: No    Physically abused: No    Forced sexual activity: No  Other Topics Concern  . Not on file  Social History Narrative   Married in [place], lives in one story house with 2 people and no pets   Occupation: ?   Has a living Will, [place], and doesn't have DNR   Was living with Husband (in frail health) in [place] until 09/2013.  After her [facility] admission they both came to live with daughter in [place]    Family History  Problem Relation Age of Onset  . Stroke Mother   . Hypertension Mother   . Diabetes Brother   . Glaucoma Son   . Glaucoma Son   . Heart attack Neg Hx   . Colon cancer Neg Hx   . Stomach cancer Neg Hx     Allergies  Allergen Reactions  . Penicillins Itching, Rash and Other (See Comments)    Tolerated unasyn, zosyn & cephalosporins in the past.  Has patient had a PCN reaction causing immediate rash, facial/tongue/throat swelling, SOB or lightheadedness with hypotension: Yes Has patient had a PCN reaction causing severe rash involving mucus membranes or skin necrosis: Unk Has patient had a PCN reaction that required hospitalization: Unk Has patient had a PCN reaction occurring within the last 10 years: Unk If  all of the above answers are "NO", then may proceed with Cephalosporins  . Other     Patient tolerates amoxicillin and cephalosporins    Outpatient Encounter Medications as of 12/04/2017  Medication Sig  . acetaminophen (TYLENOL) 500 MG tablet Take 1,000 mg by mouth every 6 (six) hours as needed (pain).  . AMINO ACIDS-PROTEIN HYDROLYS PO Take 30 mLs by mouth 2 (two) times daily.  [name] atorvastatin (LIPITOR) 10 MG tablet TAKE 1 TABLET BY MOUTH EVERY DAY  . b complex vitamins tablet Take 1 tablet by mouth daily.   . calcium-vitamin D (OSCAL WITH D) 500-200 MG-UNIT per tablet Take 1 tablet by mouth daily with breakfast.   . carvedilol (COREG) 6.25 MG tablet Take 6.25 mg by mouth 2 (two) times daily.  . collagenase (SANTYL) ointment Apply 1 application topically daily.  . Cranberry 450 MG TABS TAKE 1 TABLET BY MOUTH 2 (TWO) TIMES DAILY.  . cycloSPORINE (RESTASIS) 0.05 % ophthalmic emulsion USE 1 DROP INTO BOTH EYES TWICE DAILY  . feeding supplement (OSMOLITE 1 CAL) LIQD Take 711 mLs by mouth at bedtime.   . ferrous sulfate 325 (65 FE) MG tablet TAKE ONE TABLET BY MOUTH ONCE DAILY WITH BREAKFAST  . fluticasone (FLONASE) 50 MCG/ACT nasal spray SPRAY 2 SPRAYS INTO EACH NOSTRIL EVERY DAY  . furosemide (LASIX) 40 MG tablet Take 40 mg by mouth daily.  [name] gabapentin (NEURONTIN) 300 MG capsule  TAKE 2 CAPSULES (600 MG TOTAL) BY MOUTH 3 (THREE) TIMES DAILY.  [name] gentamicin cream (GARAMYCIN) 0.1 % Apply 1 application topically 3 (three) times daily.  . hydrALAZINE (APRESOLINE) 50 MG tablet Take 50 mg by mouth 3 (three) times daily.  . insulin aspart (NOVOLOG FLEXPEN) 100 UNIT/ML FlexPen Take three times daily before each meal based on sliding scale 140-199( 2 units), 200-250 (4 units), 251-299 (6 units), 300-349 (8 units), > 350(10 units)  . Insulin Glargine (LANTUS) 100 UNIT/ML Solostar Pen Injection 0.1 ml (10 units) once daily to control blood sugar. Dx E11.9  . Insulin Pen Needle (B-D UF III MINI PEN NEEDLES)  31G X 5 MM MISC Use twice daily with giving insulin injections. Dx E11.9  . ipratropium-albuterol (DUONEB) 0.5-2.5 (3) MG/3ML SOLN Take 3 mLs by nebulization every 6 (six) hours as needed (sob). Use 3 times daily x 4 days then every 6 hours as needed.  . isosorbide mononitrate (IMDUR) 30 MG 24 hr tablet Take 2 tablets (60 mg total) by mouth daily.  [name] latanoprost (XALATAN) 0.005 % ophthalmic solution Place 1 drop into both eyes at bedtime.  . levalbuterol (XOPENEX HFA) 45 MCG/ACT inhaler Inhale 2 puffs into the lungs every 6 (six) hours as needed for wheezing or shortness of breath.  . levothyroxine (SYNTHROID, LEVOTHROID) 137 MCG tablet TAKE 1 TABLET (137 MCG TOTAL) BY MOUTH DAILY BEFORE BREAKFAST.  . mometasone-formoterol (DULERA) 200-5 MCG/ACT AERO Inhale 2 puffs into the lungs 2 (two) times daily.  . Multiple Vitamin (MULTIVITAMIN WITH MINERALS) TABS tablet Take 1 tablet by mouth daily.  [name] nystatin (MYCOSTATIN/NYSTOP) powder Apply topically 3 (three) times daily.  [name] oxyCODONE (ROXICODONE) 5 MG immediate release tablet Take 1 tablet (5 mg total) by mouth every 4 (four) hours as needed for severe pain. May take 1/2 tab q4hrs as needed for mild pain.  . OXYGEN Inhale 2 L into the lungs at bedtime.  . pantoprazole (PROTONIX) 40 MG tablet Take 1 tablet (40 mg total) by mouth 2 (two) times daily.  Bertram Gala Glycol-Propyl Glycol 0.4-0.3 % SOLN Place 1 drop into both eyes 4 (four) times daily.   . sertraline (ZOLOFT) 100 MG tablet TAKE ONE TABLET BY MOUTH ONCE DAILY FOR DEPRESSION  . warfarin (COUMADIN) 2.5 MG tablet TAKE AS DIRECTED BY COUMADIN CLINIC.  [name] Water For Irrigation, Sterile (FREE WATER) SOLN Place 60 mLs into feeding tube 3 (three) times daily.   Cliffton Asters Petrolatum-Mineral Oil (SYSTANE NIGHTTIME) OINT Apply 1 application to eye at bedtime. Both eyes  . [DISCONTINUED] furosemide (LASIX) 40 MG tablet Take 1 tablet (40 mg total) by mouth daily.   No facility-administered encounter  medications on file as of 12/04/2017.     Review of Systems:  Review of Systems  HENT: Positive for trouble swallowing.   Gastrointestinal: Positive for abdominal pain.  Musculoskeletal: Positive for arthralgias.  Skin: Positive for wound.  All other systems reviewed and are negative.   Health Maintenance  Topic Date Due  . FOOT EXAM  08/22/2017  . INFLUENZA VACCINE  11/01/2017  . MAMMOGRAM  10/17/2023 (Originally 10/23/2015)  . HEMOGLOBIN A1C  02/01/2018  . OPHTHALMOLOGY EXAM  05/07/2018  . TETANUS/TDAP  07/19/2026  . DEXA SCAN  Completed  . PNA vac Low Risk Adult  Completed    Physical Exam: Vitals:   12/04/17 1357  BP: 134/68  Pulse: 70  Temp: 97.9 F (36.6 C)  TempSrc: Oral  SpO2: 98%  Weight: 167 lb 12.8 oz (76.1 kg)  Height: 5\' 6"  (1.676 m)   Body mass index is 27.08 kg/m. Physical Exam  Constitutional: She is oriented to person, place, and time. She appears well-developed and well-nourished.  HENT:  Mouth/Throat: Oropharynx is clear and moist. No oropharyngeal exudate.  MMM; no oral thrush  Eyes: Pupils are equal, round, and reactive to light. No scleral icterus.  Neck: Neck supple. Carotid bruit is not present. No tracheal deviation present. No thyromegaly present.  Cardiovascular: Normal rate, regular rhythm and intact distal pulses. Exam reveals no gallop and no friction rub.  Murmur (1/6 SEM) heard. Pulses:      Dorsalis pedis pulses are 1+ on the right side, and 1+ on the left side.       Posterior tibial pulses are 1+ on the right side, and 1+ on the left side.  Trace BLE edema; no calf TTP  Pulmonary/Chest: Effort normal and breath sounds normal. No stridor. No respiratory distress. She has no wheezes. She has no rales.  Abdominal: Soft. Normal appearance and bowel sounds are normal. She exhibits no distension and no mass. There is no hepatomegaly. There is no tenderness. There is no rigidity, no rebound and no guarding. No hernia.  GJ tube intact    Musculoskeletal: She exhibits edema and tenderness.       Right foot: There is decreased range of motion and deformity.       Left foot: There is decreased range of motion and deformity.  Feet:  Right Foot:  Skin Integrity: Positive for ulcer (right 1st toe).  Left Foot:  Skin Integrity: Positive for callus (at bunion).  Lymphadenopathy:    She has no cervical adenopathy.  Neurological: She is alert and oriented to person, place, and time. She has normal reflexes.  Skin: Skin is warm and dry. No rash noted.  Psychiatric: She has a normal mood and affect. Her behavior is normal. Judgment and thought content normal.    Labs reviewed: Basic Metabolic Panel: Recent Labs    02/22/17 0436 02/23/17 0330  06/28/17 0324 08/01/17 1039 08/07/17 1119  NA 141 137   < > 135 136 136  K 4.2 4.1   < > 4.1 4.7 4.7  CL 114* 111   < > 101 100 98  CO2 22 22   < > 25 30 25   GLUCOSE 146* 142*   < > 263* 125* 70  BUN 32* 24*   < > 48* 65* 52*  CREATININE 1.03* 0.99   < > 1.41* 1.53* 1.33*  CALCIUM 8.9 9.0   < > 8.8* 9.3 9.2  MG 1.9 1.6*  --   --   --   --   PHOS  --  2.1*  --   --   --   --   TSH  --   --   --   --  3.67 4.880*   < > = values in this interval not displayed.   Liver Function Tests: Recent Labs    08/01/17 1039 08/07/17 1119  AST 20 22  ALT 14 15  ALKPHOS  --  99  BILITOT 0.3 0.2  PROT 8.0 8.1  ALBUMIN  --  4.1   No results for input(s): LIPASE, AMYLASE in the last 8760 hours. No results for input(s): AMMONIA in the last 8760 hours. CBC: Recent Labs    08/01/17 1039 08/07/17 1119 10/05/17 1047  WBC 6.3 5.8 6.7  NEUTROABS 3,975 3.3 4,402  HGB 7.9* 8.6* 9.4*  HCT 24.3* 26.9* 29.7*  MCV 86.5 88 88.9  PLT 288 331 265   Lipid Panel: Recent Labs    08/01/17 1039  CHOL 97  HDL 53  LDLCALC 23  TRIG 120  CHOLHDL 1.8   Lab Results  Component Value Date   HGBA1C 7.0 (H) 08/01/2017    Procedures since last visit: Ir Cm Inj Any Colonic Tube  W/fluoro  Result Date: 11/05/2017 INDICATION: Leaking gastrojejunostomy tube EXAM: INJECTION OF GASTROJEJUNOSTOMY TUBE COMPARISON:  Multiple previous fluoroscopic guided gastrojejunostomy exchanges, most recently performed on 10/15/2017. MEDICATIONS: None. CONTRAST:  10mL ISOVUE-300 IOPAMIDOL (ISOVUE-300) INJECTION 61% - administered into the proximal small bowel lumen FLUOROSCOPY TIME:  12 seconds (2 mGy) COMPLICATIONS: None. PROCEDURE: Patient was placed supine on the fluoroscopy table. Preprocedural spot fluoroscopic image demonstrates unchanged positioning of the existing chronic 22 French dual lumen balloon retention gastrostomy tube. Contrast injection demonstrated appropriate position functionality of the jejunal limb. A dressing was placed. The patient tolerated procedure well without immediate postprocedural complication. IMPRESSION: Appropriately positioned and functioning gastrojejunostomy tube. No exchange performed. PLAN: Patient's daughter was advised to attempt to limit the patient's amount of oral intake as this could worsen the reported leakage associated with the patient's chronic balloon retention gastrojejunostomy catheter. Electronically Signed   By: Simonne Come M.D.   On: 11/05/2017 12:19    Assessment/Plan     ICD-10-CM   1. Diabetes mellitus type 2 in nonobese (HCC) E11.9 Hemoglobin A1c    CMP with eGFR([facility])  2. Ulcer of abdomen wall, limited to breakdown of skin (HCC) L98.491   3. Primary osteoarthritis involving multiple joints M15.0   4. PEG (percutaneous endoscopic gastrostomy) status (HCC) Z93.1   5. Persistent atrial fibrillation (HCC) I48.1   6. Diabetic ulcer of toe of right foot associated with type 2 diabetes mellitus, with necrosis of muscle (HCC) E11.621    L97.513   7. Chronic kidney insufficiency, stage 3 (moderate) (HCC) N18.3   8. PAD (peripheral artery disease) (HCC) I73.9   9. Need for influenza vaccination Z23 Flu Vaccine QUAD 6+ mos PF IM (Fluarix Quad  PF)   Follow up with podiatry for foot care  Take pain medicine as needed for severe pain. May take stool softener with pain med to prevent constipation  Flu shot given today  Follow up with wound care center as scheduled - continue current wound care until seen by wound care  Continue current medication as ordered  Follow up as scheduled   Lennox Dolberry S. Ancil Linsey  [facility] [address] [place], [place] [address] [phone] Cell (Monday-Friday 8 AM - 5 PM) [phone] After 5 PM and follow prompts

## 2017-12-05 LAB — COMPLETE METABOLIC PANEL WITH GFR
AG Ratio: 1 (calc) (ref 1.0–2.5)
ALT: 16 U/L (ref 6–29)
AST: 21 U/L (ref 10–35)
Albumin: 3.8 g/dL (ref 3.6–5.1)
Alkaline phosphatase (APISO): 101 U/L (ref 33–130)
BUN/Creatinine Ratio: 48 (calc) — ABNORMAL HIGH (ref 6–22)
BUN: 69 mg/dL — ABNORMAL HIGH (ref 7–25)
CO2: 33 mmol/L — ABNORMAL HIGH (ref 20–32)
Calcium: 9.7 mg/dL (ref 8.6–10.4)
Chloride: 98 mmol/L (ref 98–110)
Creat: 1.44 mg/dL — ABNORMAL HIGH (ref 0.60–0.88)
GFR, Est African American: 36 mL/min/{1.73_m2} — ABNORMAL LOW (ref >=60)
GFR, Est Non African American: 31 mL/min/{1.73_m2} — ABNORMAL LOW (ref >=60)
Globulin: 4 g/dL (calc) — ABNORMAL HIGH (ref 1.9–3.7)
Glucose, Bld: 81 mg/dL (ref 65–99)
Potassium: 4.6 mmol/L (ref 3.5–5.3)
Sodium: 136 mmol/L (ref 135–146)
Total Bilirubin: 0.3 mg/dL (ref 0.2–1.2)
Total Protein: 7.8 g/dL (ref 6.1–8.1)

## 2017-12-05 LAB — HEMOGLOBIN A1C
Hgb A1c MFr Bld: 6.8 % of total Hgb — ABNORMAL HIGH (ref <5.7)
Mean Plasma Glucose: 148 (calc)
eAG (mmol/L): 8.2 (calc)

## 2017-12-05 IMAGING — CT CT ABD-PELV W/O CM
2 of 5 series · 16 of 46 positions shown, 18 images · non-contrast
Comparison: 01/10/2015

CLINICAL DATA: Bloating, difficulty swallowing, history small-bowel
obstruction, ventral hernia, GERD, stomach ulcers, hypertension,
type II diabetes mellitus, COPD, asthma

EXAM:
CT ABDOMEN AND PELVIS WITHOUT CONTRAST
TECHNIQUE: Multidetector CT imaging of the abdomen and pelvis was performed
following the standard protocol without IV contrast. Sagittal and
coronal MPR images reconstructed from axial data set. Patient drank
dilute oral contrast for exam.

[Series 2: abd/ pelvis 5.0 i30f 1 · axial · 0.65mm/px · z∈[-684,-324]mm · 13 of 80 slices shown, 15 images]
[im 4/80  soft-tissue]
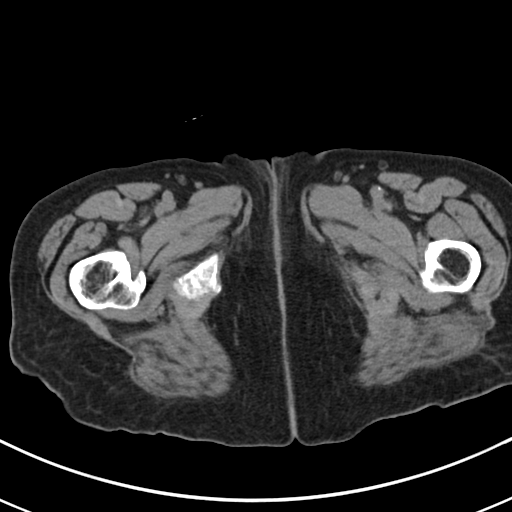
[im 4/80  bone]
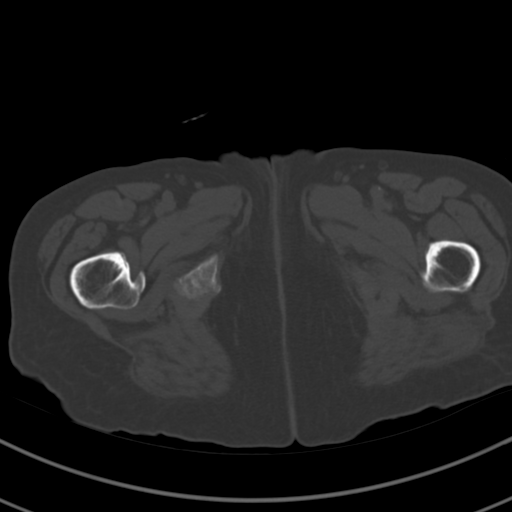
[im 11/80  soft-tissue]
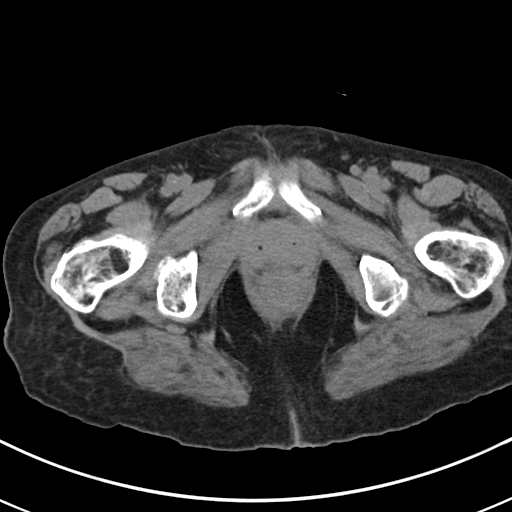
[im 18/80  soft-tissue]
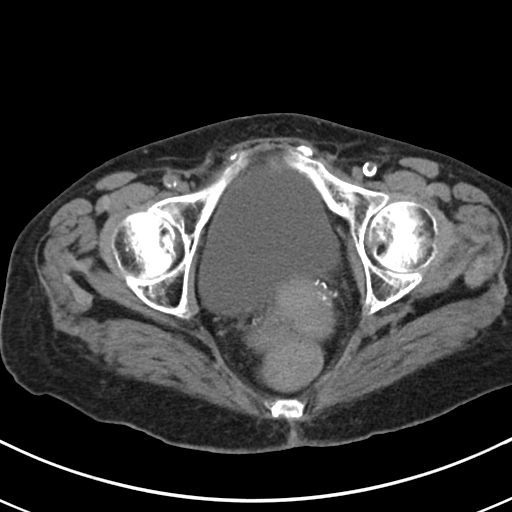
[im 21/80  soft-tissue]
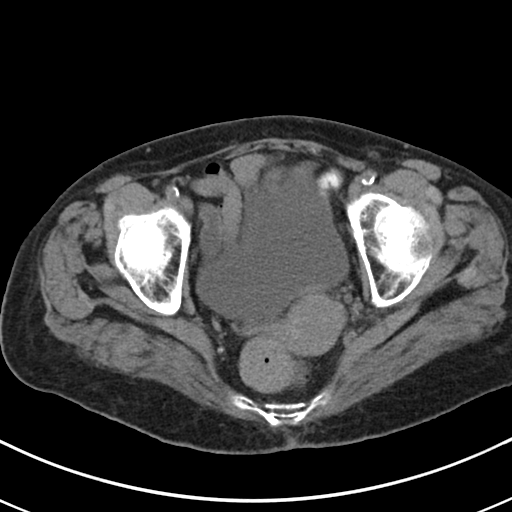
[im 28/80  soft-tissue]
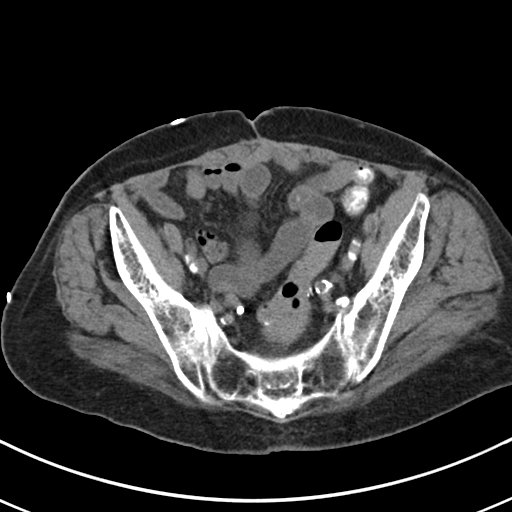
[im 35/80  soft-tissue]
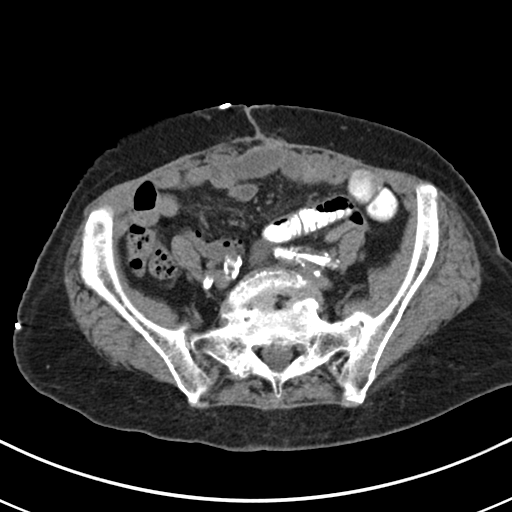
[im 42/80  soft-tissue]
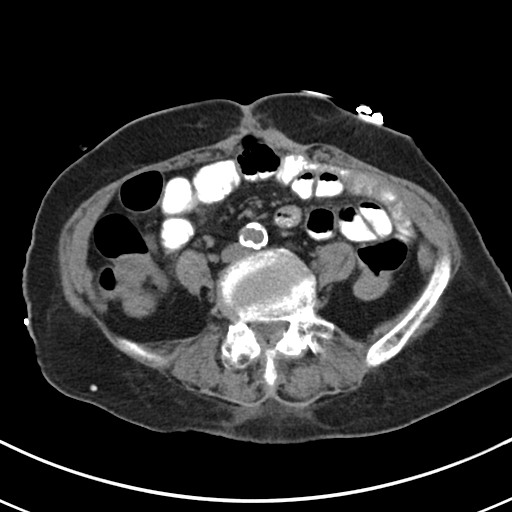
[im 45/80  soft-tissue]
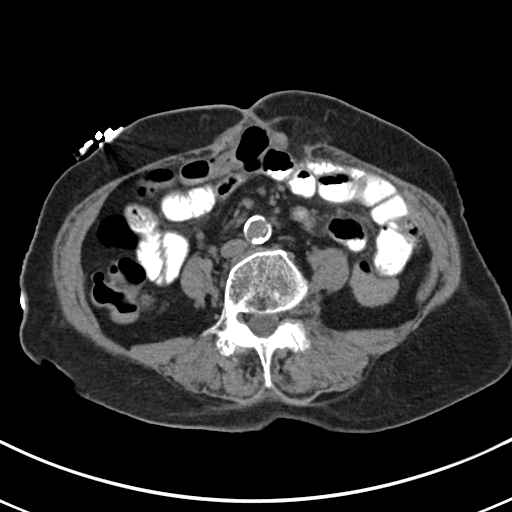
[im 52/80  soft-tissue]
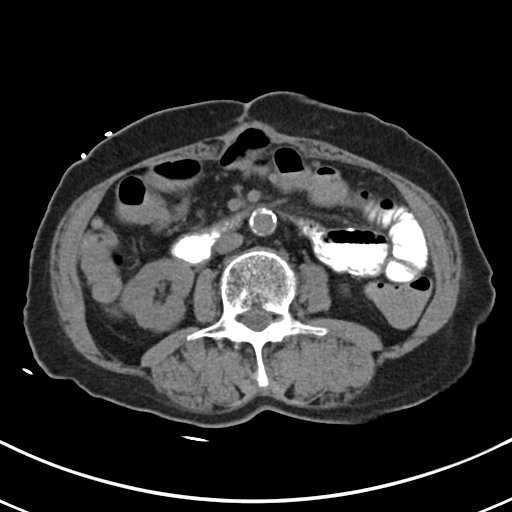
[im 52/80  bone]
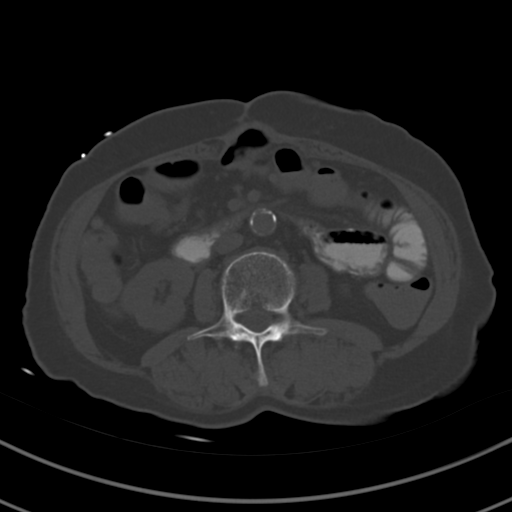
[im 59/80  soft-tissue]
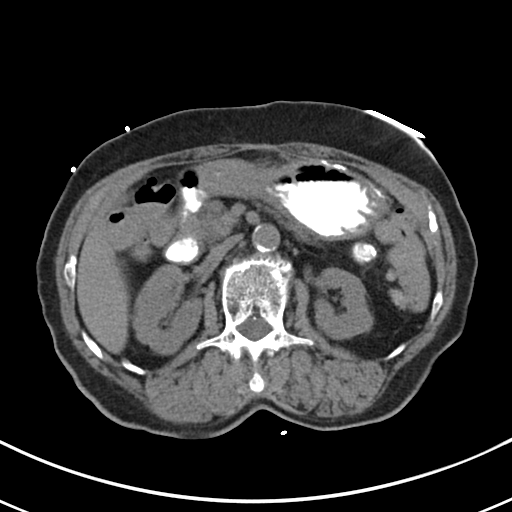
[im 62/80  soft-tissue]
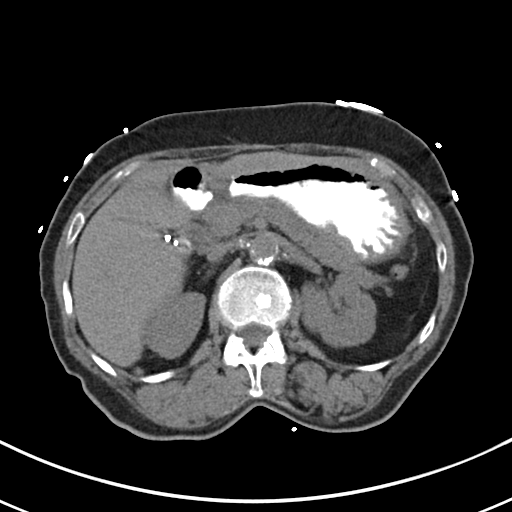
[im 69/80  soft-tissue]
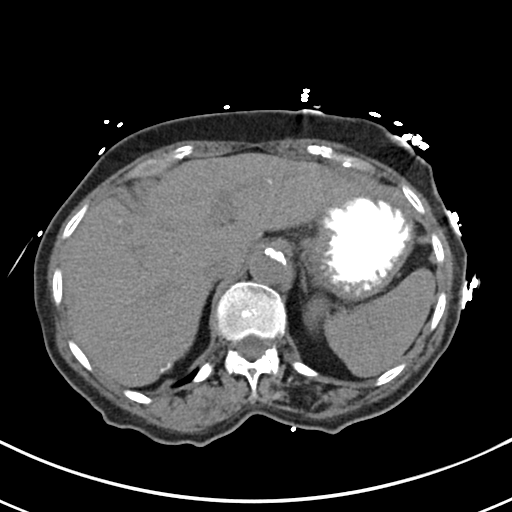
[im 76/80  soft-tissue]
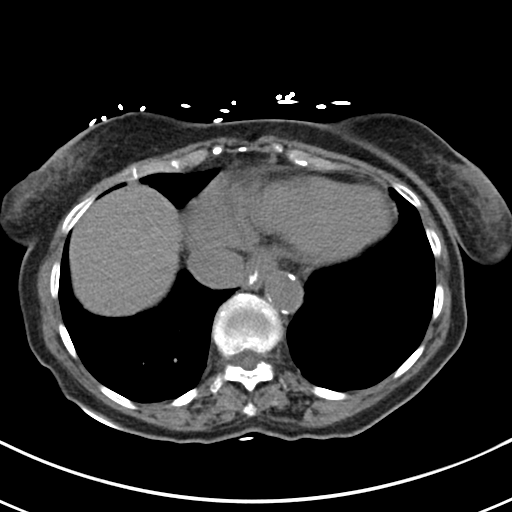

[Series 5: cor st · coronal · 0.67mm/px · 3 of 78 slices shown]
[im 26/78  soft-tissue]
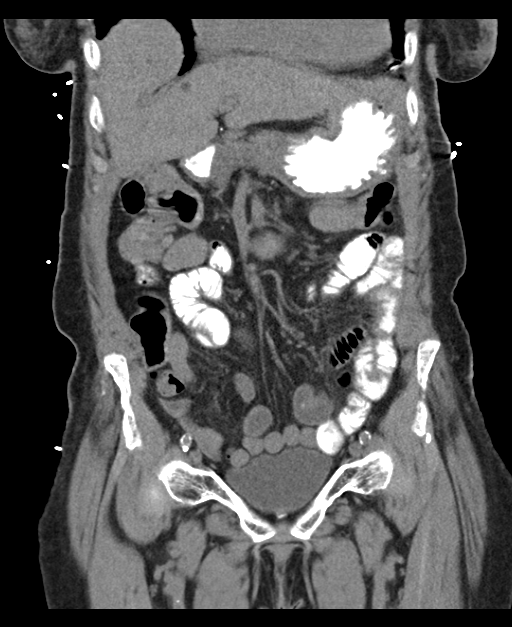
[im 35/78  soft-tissue]
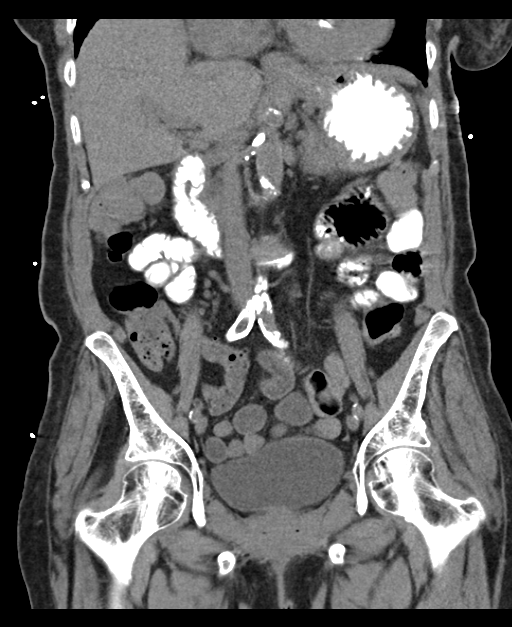
[im 43/78  soft-tissue]
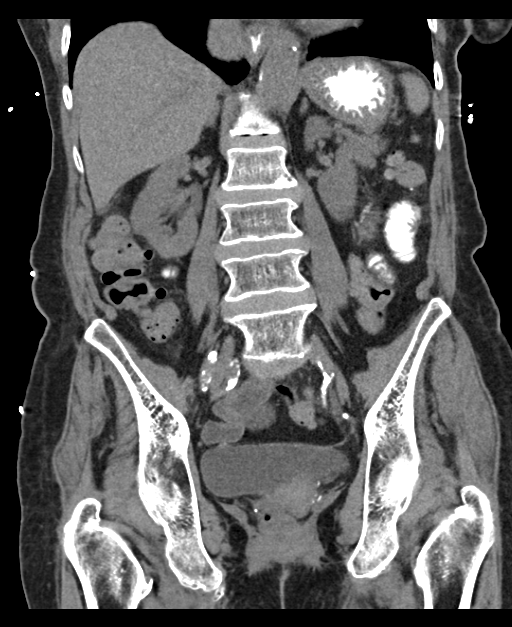

[16 of 46 positions shown; findings below may reference images not displayed]

FINDINGS: Lung bases clear.

Multiple low-attenuation hepatic foci likely cysts, unchanged.

Gallbladder surgically absent.

Remainder of liver, spleen, pancreas, kidneys, and adrenal glands
normal.

Extensive atherosclerotic calcifications.

Questionable mild wall thickening of distal esophagus.

Increased thickening of the gastric wall versus the previous exam,
nonspecific.

Small ventral hernia containing nonobstructed bowel loop.

Scattered colonic diverticulosis without evidence of diverticulitis
or bowel obstruction.

Bladder, ureters, uterus and adnexa normal appearance.

Appendix not visualized.

No mass, adenopathy, free air or free fluid.

Bones demineralized.
IMPRESSION: No definite acute intra abdominal abnormalities.

Questionable mild wall thickening of the gastric wall versus
artifact from underdistention ; consider followup endoscopy or upper
GI assessment if patient has persistent symptoms.

## 2017-12-05 IMAGING — DX DG CHEST 1V
1 series · 1 of 1 positions shown · non-contrast
Comparison: 06/20/2015

CLINICAL DATA: Dysphagia

EXAM:
CHEST 1 VIEW

[chest ap]
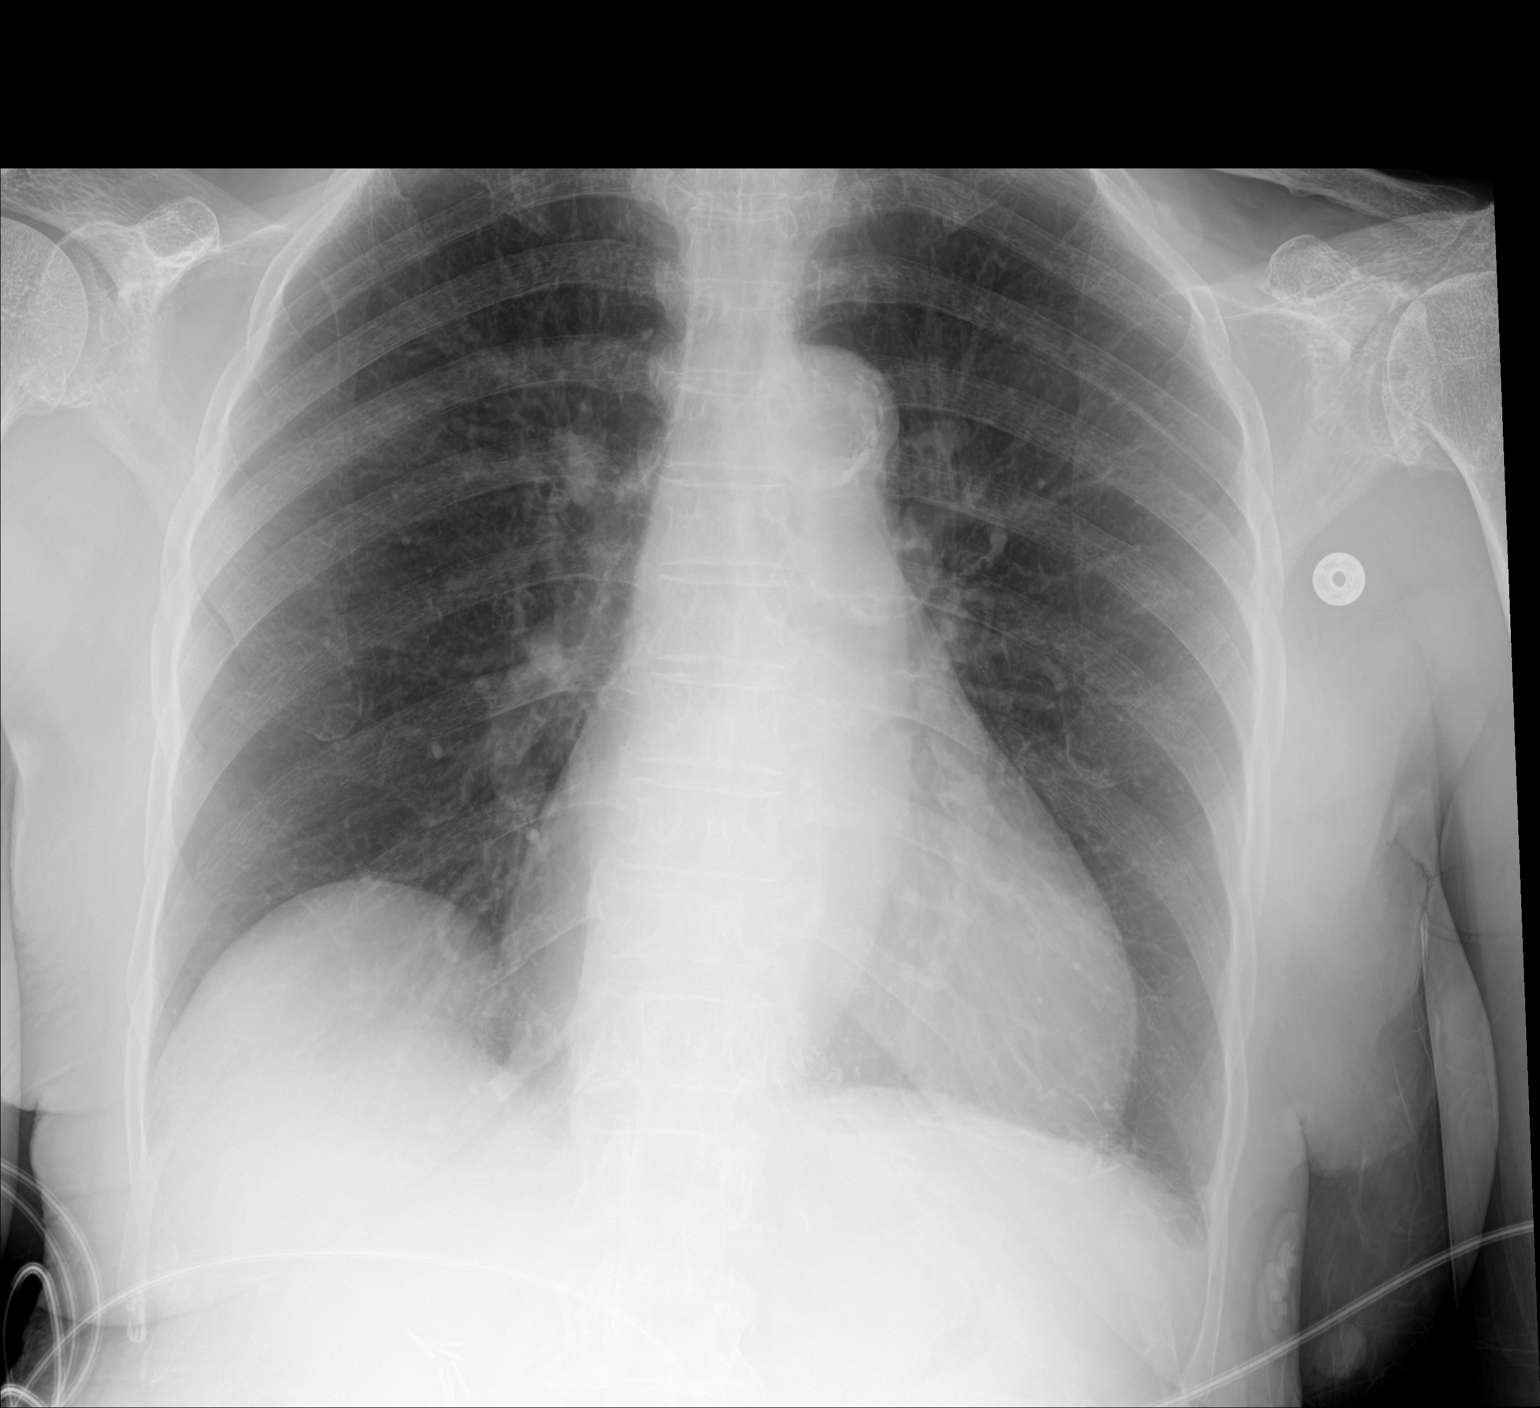

[1 of 1 positions shown; findings below may reference images not displayed]

FINDINGS: There is unchanged mild cardiomegaly. The lungs are clear. No large
effusions. Normal pulmonary vasculature. Unremarkable hilar and
mediastinal contours, unchanged.
IMPRESSION: Cardiomegaly, unchanged.  No acute cardiopulmonary findings.

## 2017-12-06 ENCOUNTER — Encounter: Payer: Self-pay | Admitting: Internal Medicine

## 2017-12-06 IMAGING — CR DG CHEST 1V PORT
1 series · 1 of 1 positions shown · non-contrast
Comparison: 07/05/2015

CLINICAL DATA: Dyspnea

EXAM:
PORTABLE CHEST 1 VIEW

[portable]
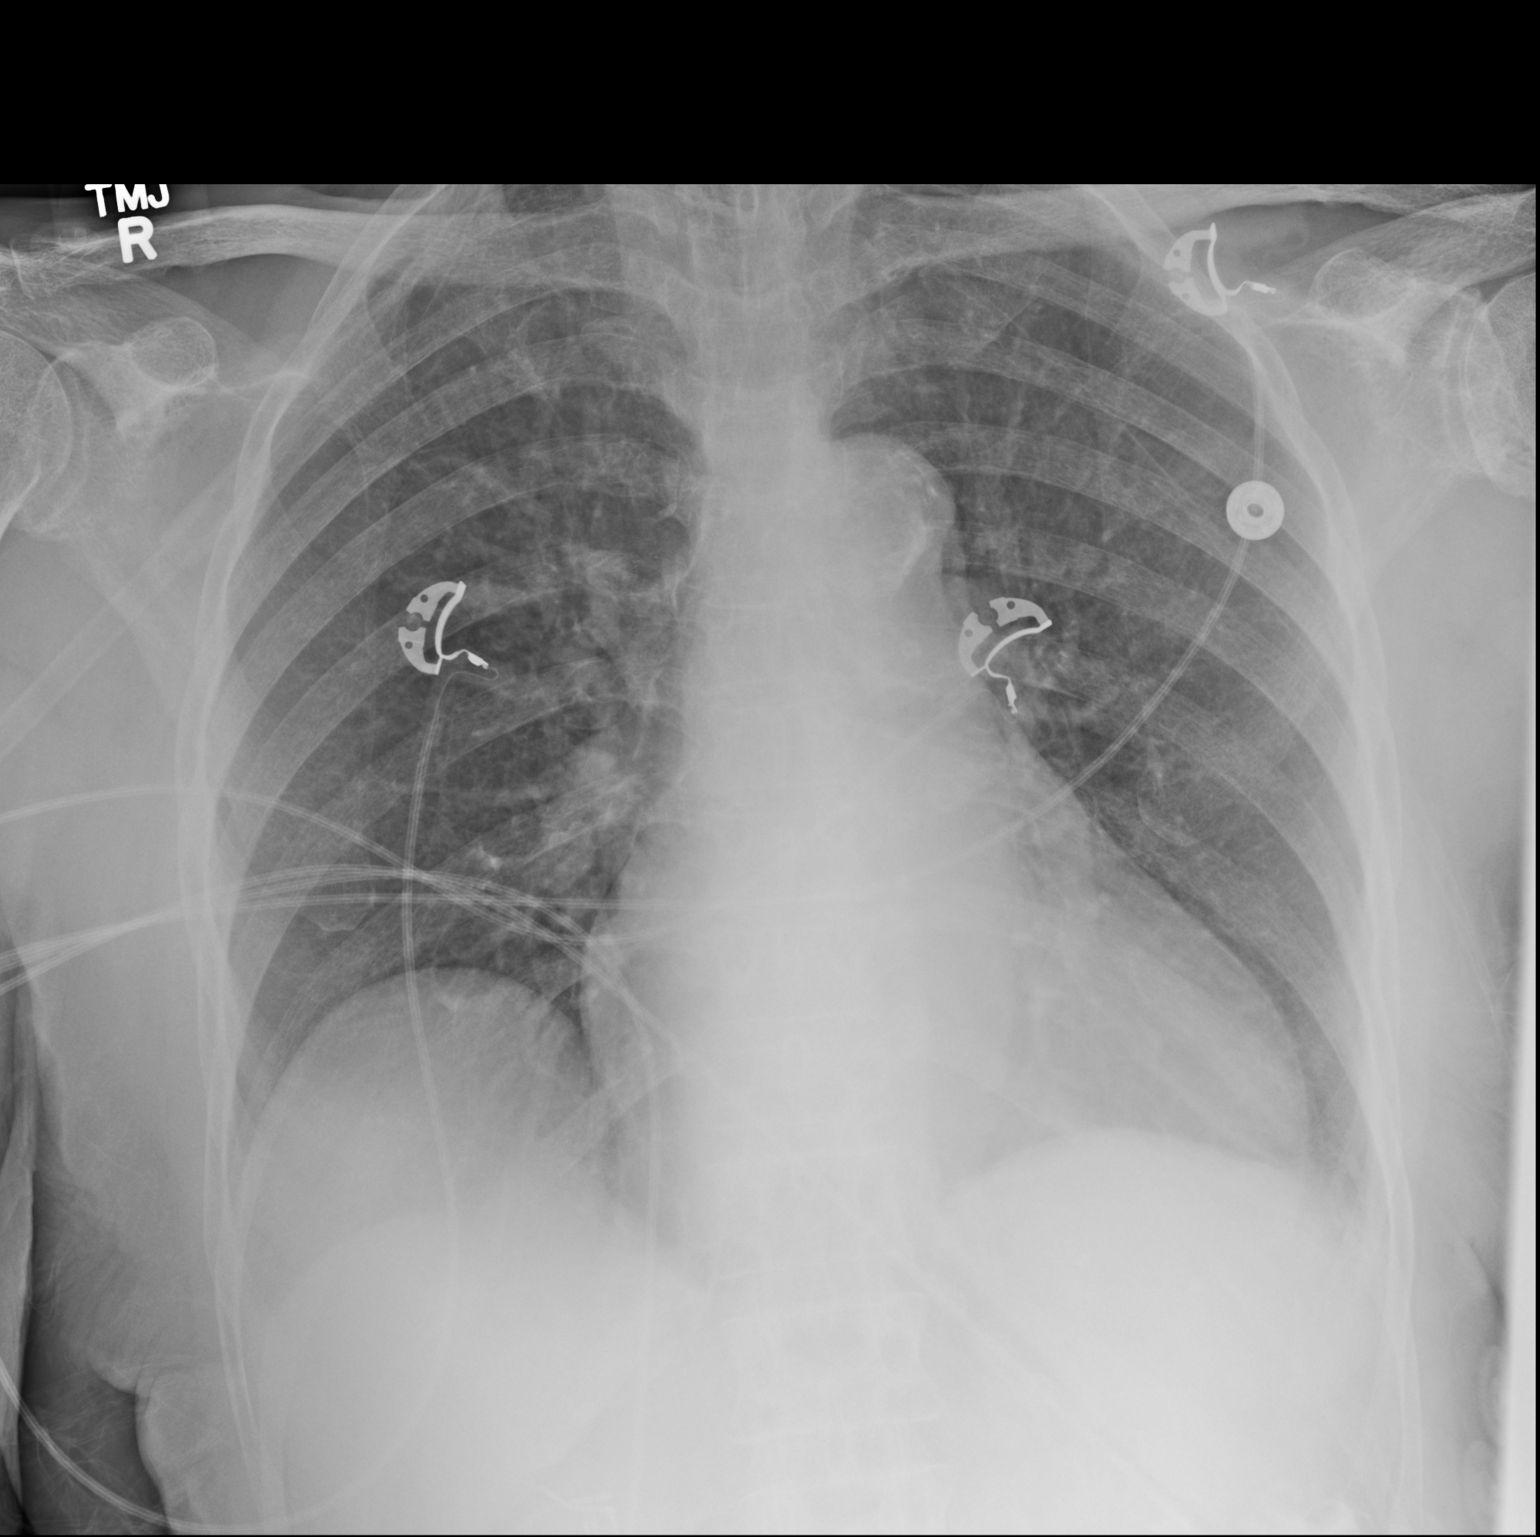

[1 of 1 positions shown; findings below may reference images not displayed]

FINDINGS: Cardiomediastinal silhouette is stable. No segmental infiltrate or
pleural effusion. Central mild vascular congestion without
convincing pulmonary edema. Elevation of the right hemidiaphragm
again noted.
IMPRESSION: No segmental infiltrate or pleural effusion. Central mild vascular
congestion without convincing pulmonary edema. Elevation of the
right hemidiaphragm again noted.

## 2017-12-07 IMAGING — CT CT CHEST W/O CM
2 of 3 series · 14 of 36 positions shown, 17 images · non-contrast
Comparison: CT abdomen 01/10/2015 and earlier studies

CLINICAL DATA: Shortness of breath the last few days. Unable to
have contrast due to renal function Hx: HTN, Stroke, COPD, Diabetes

EXAM:
CT CHEST WITHOUT CONTRAST
TECHNIQUE: Multidetector CT imaging of the chest was performed following the
standard protocol without IV contrast.

[Series 201: chest without, idose (2) · axial · non-contrast · 0.61mm/px · z∈[+73,+333]mm · 11 of 62 slices shown, 14 images]
[im 5/62  mediastinal]
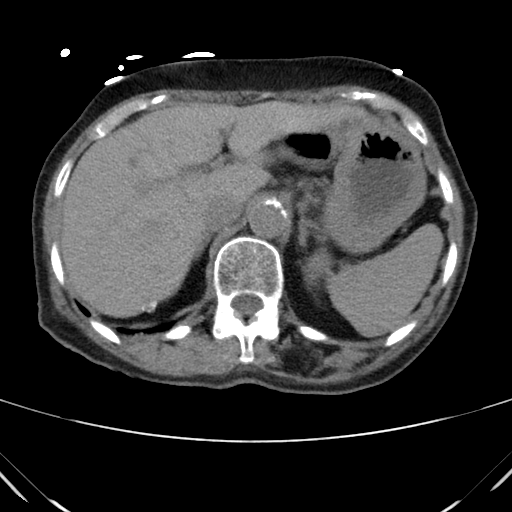
[im 5/62  lung]
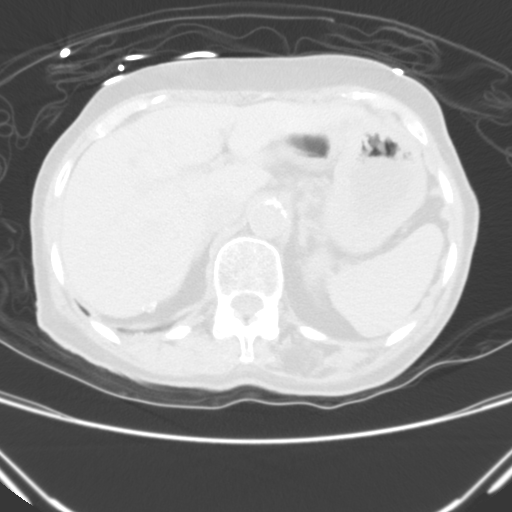
[im 10/62  lung]
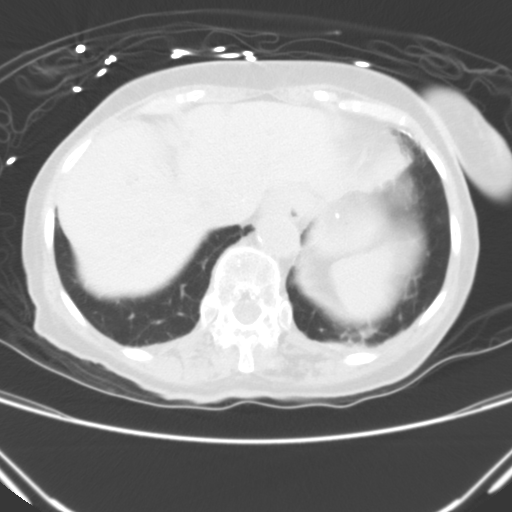
[im 14/62  lung]
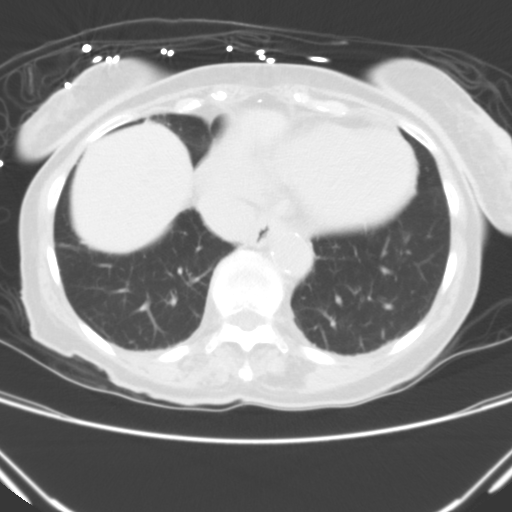
[im 21/62  lung]
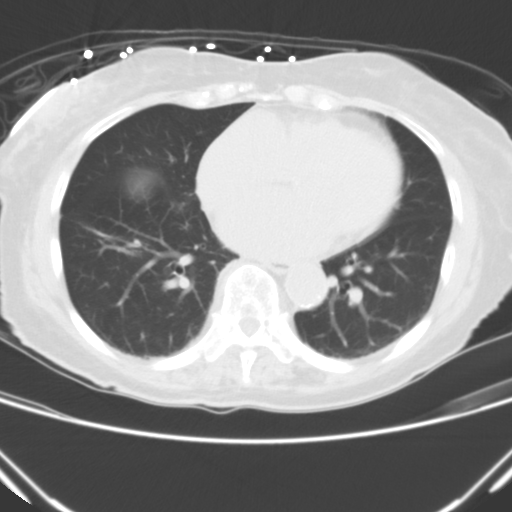
[im 25/62  mediastinal]
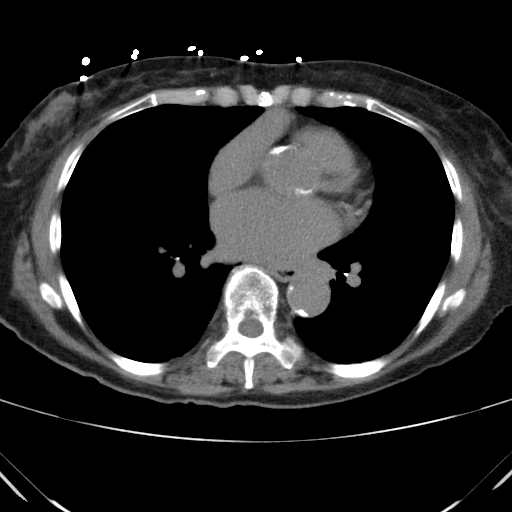
[im 25/62  lung]
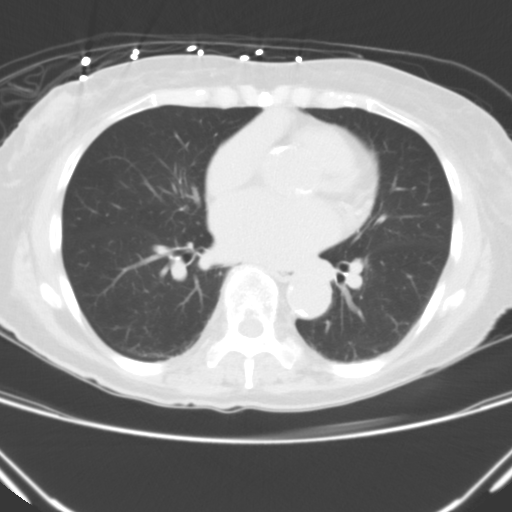
[im 32/62  lung]
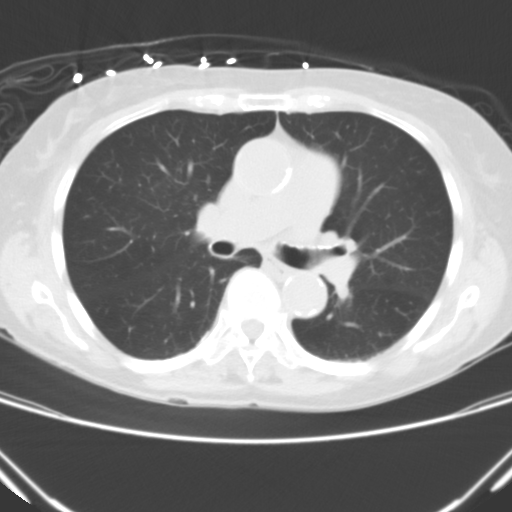
[im 37/62  lung]
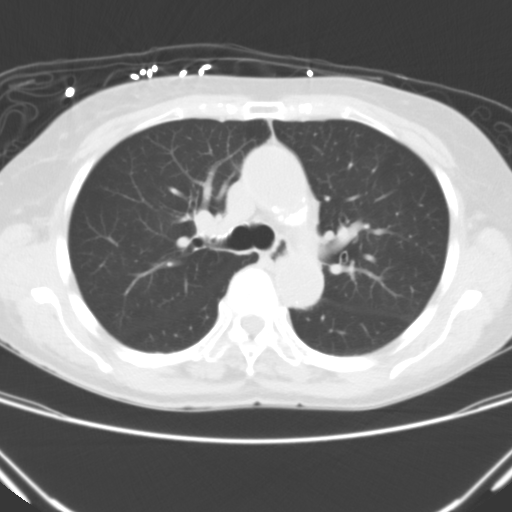
[im 41/62  lung]
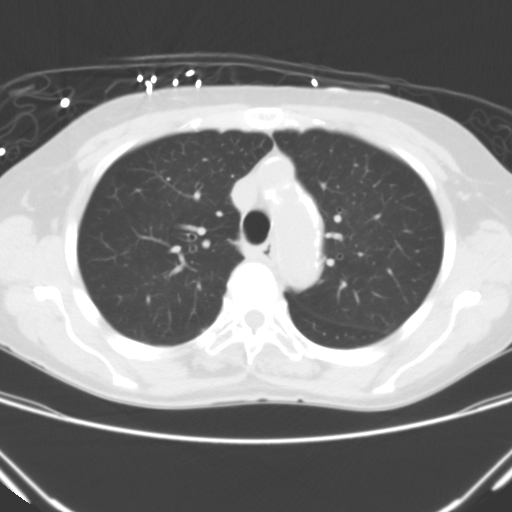
[im 48/62  mediastinal]
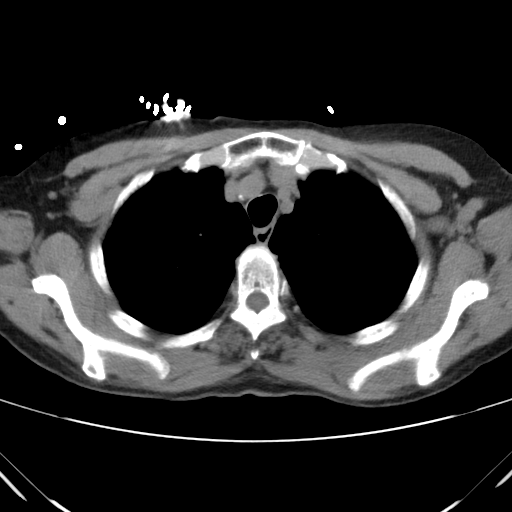
[im 48/62  lung]
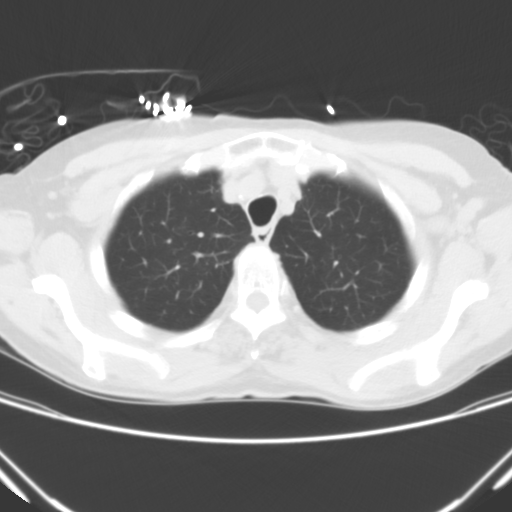
[im 52/62  lung]
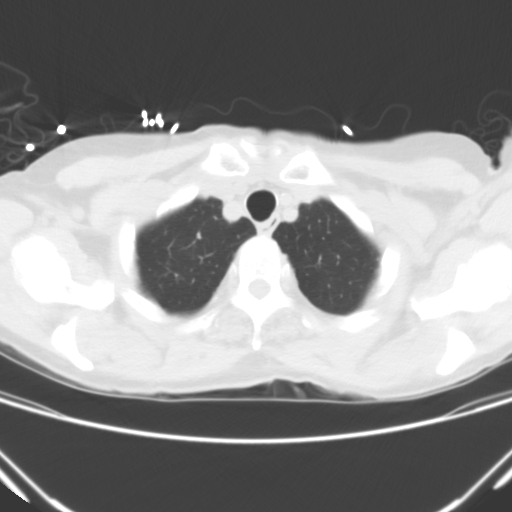
[im 57/62  lung]
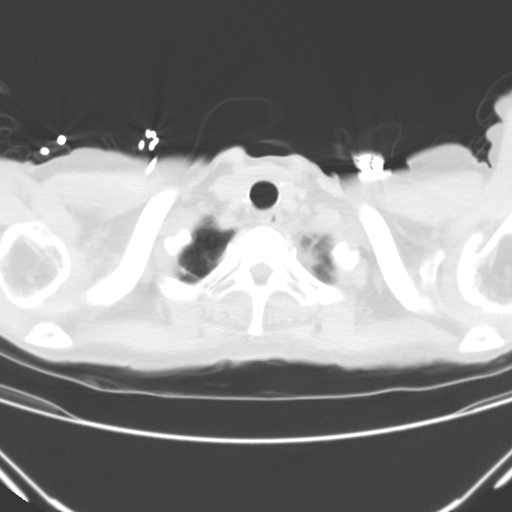

[Series 203: coronal, idose (2) · coronal · 0.45mm/px · 3 of 82 slices shown]
[im 17/82  lung]
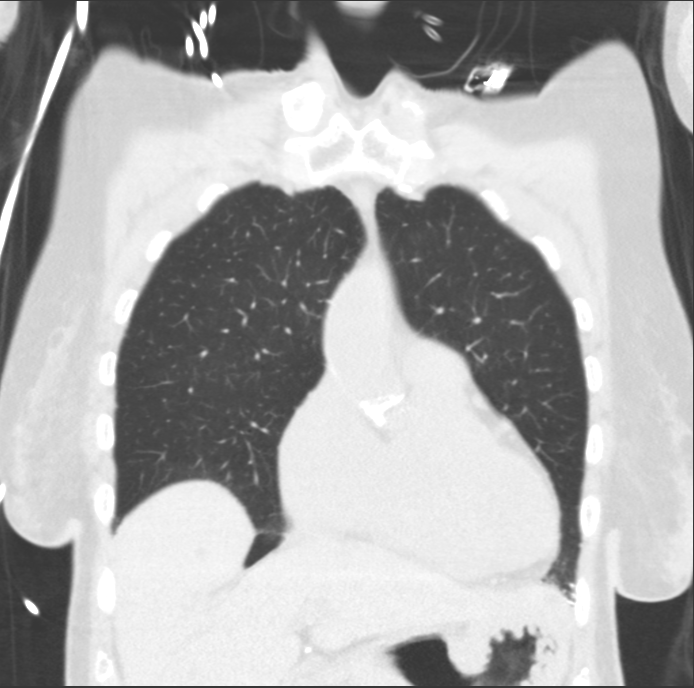
[im 33/82  lung]
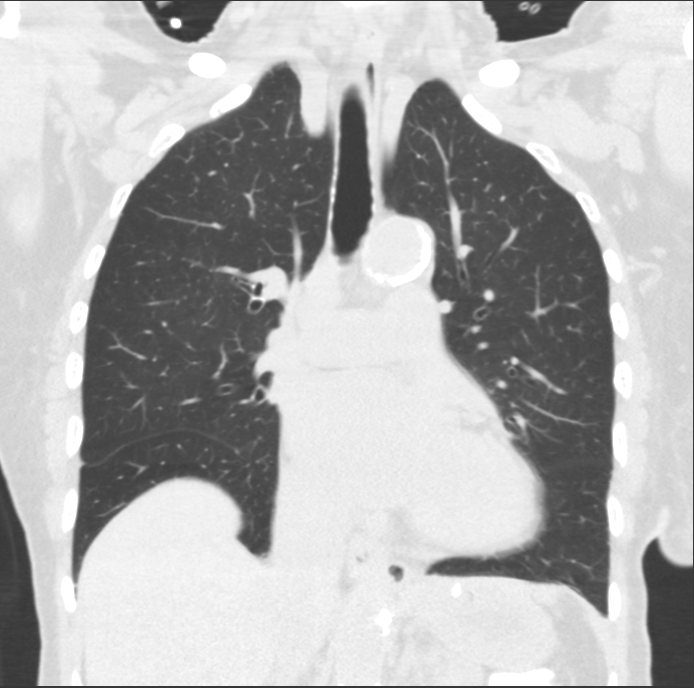
[im 49/82  lung]
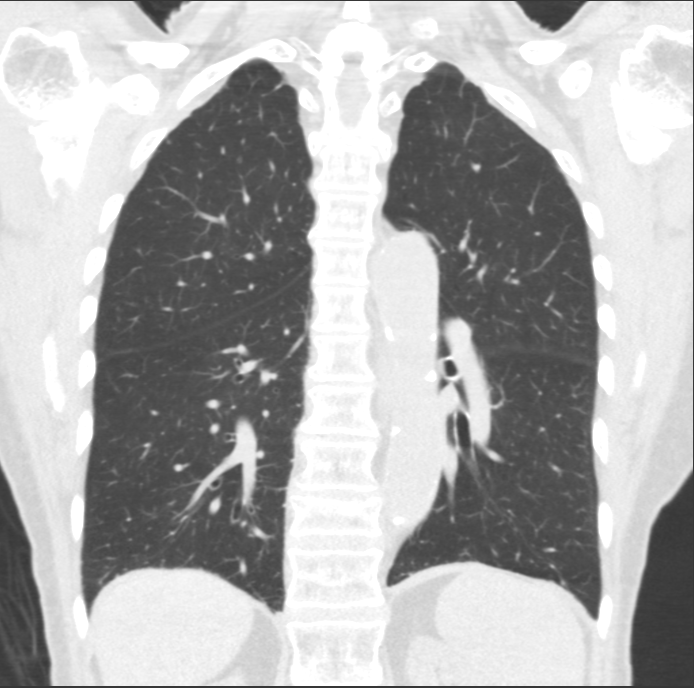

[14 of 36 positions shown; findings below may reference images not displayed]

FINDINGS: Mediastinum/Lymph Nodes: Coarse coronary and aortic calcifications.
No hilar or mediastinal adenopathy, sensitivity decreased without IV
contrast. No pericardial effusion.

Lungs/Pleura: Lungs clear.  No pleural effusion.  No pneumothorax.

Upper abdomen: Cholecystectomy clips. Stable low-attenuation liver
lesions since previous CT abdomen 05/21/2014 probably cysts.

Musculoskeletal: T11 compression fracture deformity with at least
50% loss of height anteriorly, stable 05/21/2014. No retropulsion.
No posterior element involvement. No new fracture. Sternum intact.
IMPRESSION: 1. No acute abnormality.
2. Atherosclerosis, including aortic and coronary artery disease.
Please note that although the presence of coronary artery calcium
documents the presence of coronary artery disease, the severity of
this disease and any potential stenosis cannot be assessed on this
non-gated CT examination. Assessment for potential risk factor
modification, dietary therapy or pharmacologic therapy may be
warranted, if clinically indicated.
3. Old L2-T11 compression deformity.

## 2017-12-07 IMAGING — NM NM PULMONARY VENT & PERF
12 series · 12 of 12 positions shown · non-contrast
Comparison: Chest x-ray 07/06/2015

CLINICAL DATA: Short of breath

EXAM:
NUCLEAR MEDICINE VENTILATION - PERFUSION LUNG SCAN
TECHNIQUE: Ventilation images were obtained in multiple projections using
inhaled aerosol Gc-LLm DTPA. Perfusion images were obtained in
multiple projections after intravenous injection of Gc-LLm MAA.
RADIOPHARMACEUTICALS:  29.5 mCi Lechnetium-AAm DTPA aerosol
inhalation and 4.2 mCi Lechnetium-AAm MAA IV

[Series 1: ant/post vent · 4.14mm/px · 1 of 1 slices shown (1 of 2)]
[im 1/1]
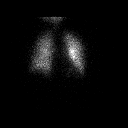

[Series 1: ant/post vent · 4.14mm/px · 1 of 1 slices shown (2 of 2)]
[im 1/1  full-range]
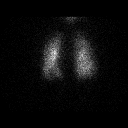

[Series 2: lao/rpo vent · 4.14mm/px · 1 of 1 slices shown (1 of 2)]
[im 1/1  full-range]
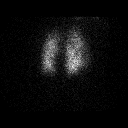

[Series 2: lao/rpo vent · 4.14mm/px · 1 of 1 slices shown (2 of 2)]
[im 1/1  full-range]
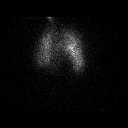

[Series 3: lpo/rao vent · 4.14mm/px · 1 of 1 slices shown (1 of 2)]
[im 1/1  full-range]
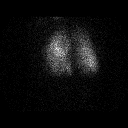

[Series 3: lpo/rao vent · 4.14mm/px · 1 of 1 slices shown (2 of 2)]
[im 1/1  full-range]
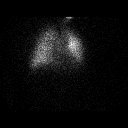

[Series 4: lpo/rao perf · 4.14mm/px · 1 of 1 slices shown (1 of 2)]
[im 1/1]
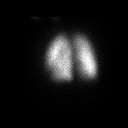

[Series 4: lpo/rao perf · 4.14mm/px · 1 of 1 slices shown (2 of 2)]
[im 1/1]
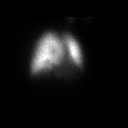

[Series 5: ant/post perf · 4.14mm/px · 1 of 1 slices shown (1 of 2)]
[im 1/1]
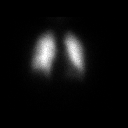

[Series 5: ant/post perf · 4.14mm/px · 1 of 1 slices shown (2 of 2)]
[im 1/1]
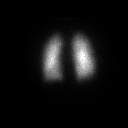

[Series 6: lao/rpo perf · 4.14mm/px · 1 of 1 slices shown (1 of 2)]
[im 1/1]
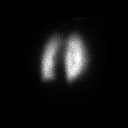

[Series 6: lao/rpo perf · 4.14mm/px · 1 of 1 slices shown (2 of 2)]
[im 1/1]
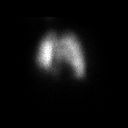

[12 of 12 positions shown; findings below may reference images not displayed]

FINDINGS: Ventilation: No focal ventilation defect.

Perfusion: No wedge shaped peripheral perfusion defects to suggest
acute pulmonary embolism.
IMPRESSION: Normal ventilation perfusion scan. Very low probability for
pulmonary embolism.

## 2017-12-08 IMAGING — CR DG CHEST 1V PORT
1 series · 1 of 1 positions shown · non-contrast
Comparison: 07/07/2015

CLINICAL DATA: Shortness of breath

EXAM:
PORTABLE CHEST 1 VIEW

[AP]
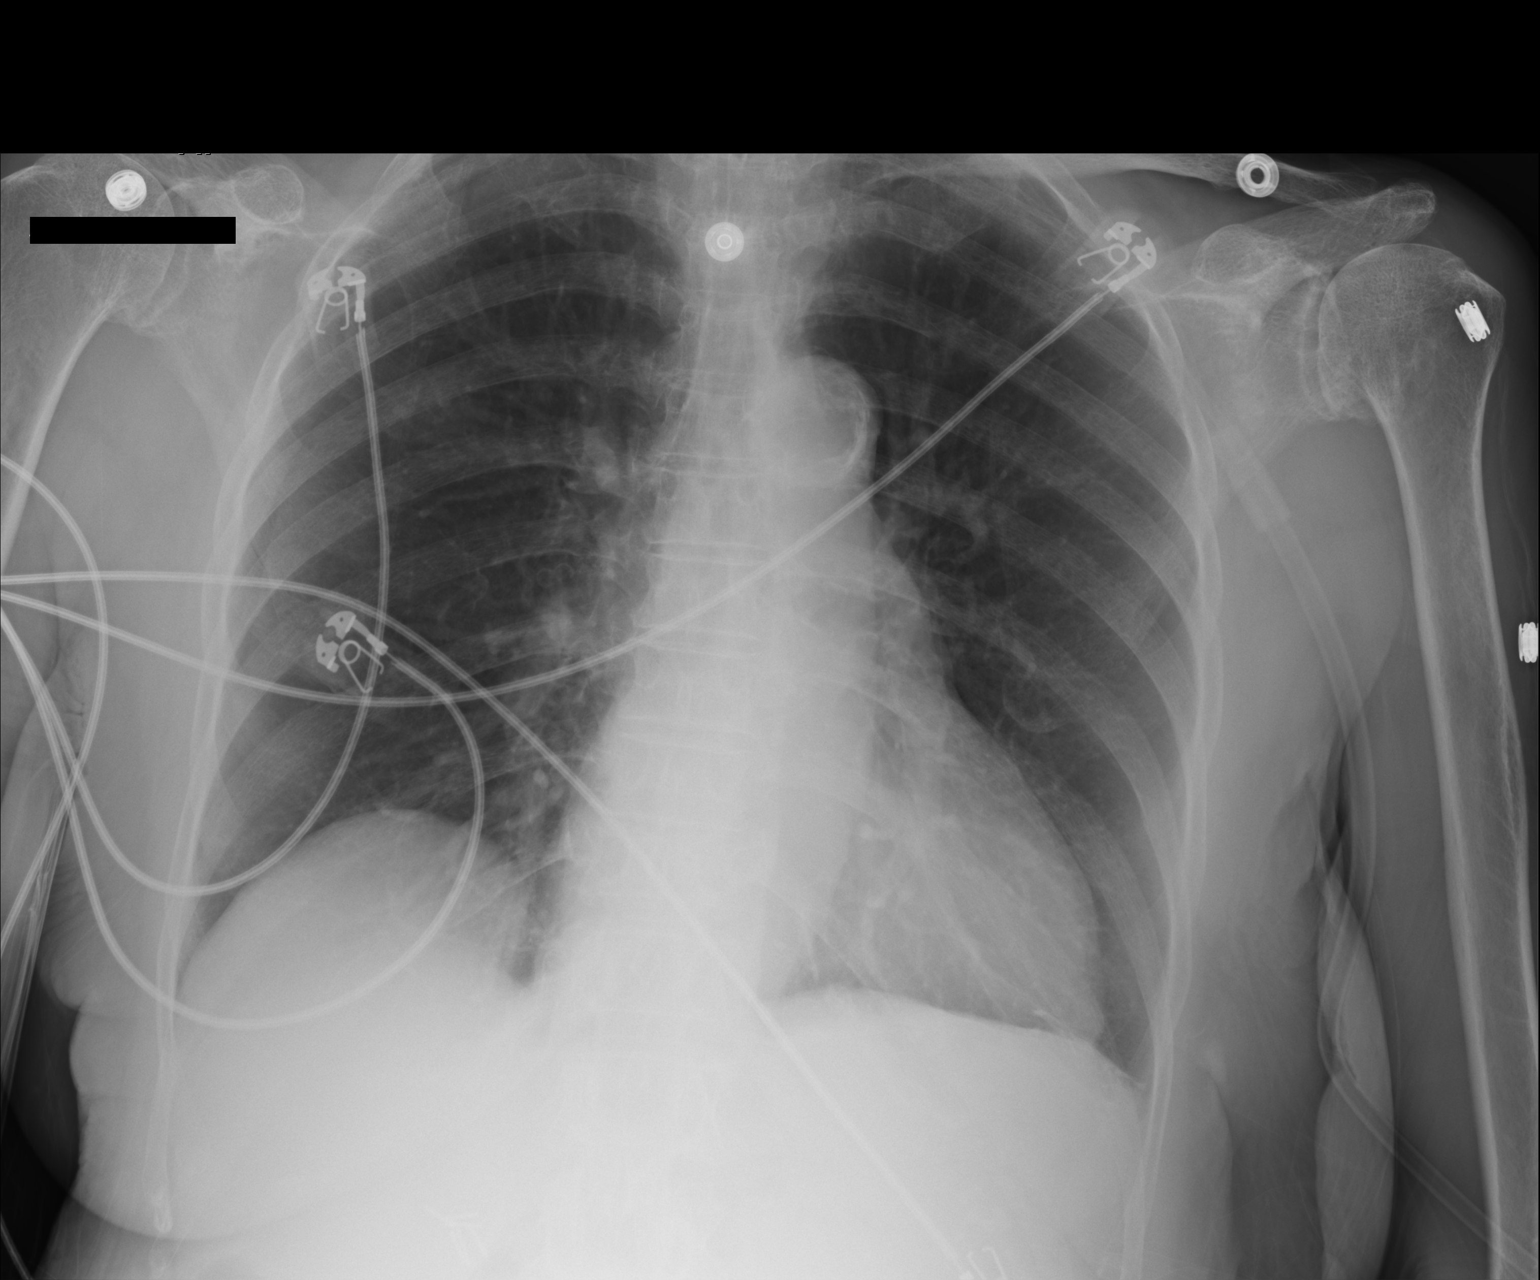

[1 of 1 positions shown; findings below may reference images not displayed]

FINDINGS: The heart size appears normal. There is aortic atherosclerosis. No
pleural effusion or edema. No airspace consolidation identified.
Osteoarthritis is noted involving both glenohumeral joints.
IMPRESSION: 1. No acute cardiopulmonary abnormalities.

## 2017-12-10 ENCOUNTER — Other Ambulatory Visit: Payer: Self-pay | Admitting: Podiatry

## 2017-12-10 ENCOUNTER — Ambulatory Visit (INDEPENDENT_AMBULATORY_CARE_PROVIDER_SITE_OTHER): Payer: Medicare Other | Admitting: Podiatry

## 2017-12-10 DIAGNOSIS — L97512 Non-pressure chronic ulcer of other part of right foot with fat layer exposed: Secondary | ICD-10-CM | POA: Diagnosis not present

## 2017-12-10 DIAGNOSIS — I70235 Atherosclerosis of native arteries of right leg with ulceration of other part of foot: Secondary | ICD-10-CM

## 2017-12-10 DIAGNOSIS — I739 Peripheral vascular disease, unspecified: Secondary | ICD-10-CM

## 2017-12-10 DIAGNOSIS — E0859 Diabetes mellitus due to underlying condition with other circulatory complications: Secondary | ICD-10-CM

## 2017-12-10 MED ORDER — DOXYCYCLINE HYCLATE 100 MG PO TABS: 100.0000 mg | ORAL_TABLET | Freq: Two times a day (BID) | ORAL | 0 refills | Status: DC

## 2017-12-11 ENCOUNTER — Other Ambulatory Visit: Payer: Self-pay | Admitting: Internal Medicine

## 2017-12-12 NOTE — Progress Notes (Signed)
Subjective:  84 year old female presenting today for follow up evaluation of ulcerations to the right foot.  Ulcers continue to improve.  We are continuing with conservative management to avoid surgical amputation given the patient's age and overall health.  Patient continues to have some pain and discomfort regarding the ulceration sites.  She presents today for further treatment evaluation  Past Medical History:  Diagnosis Date  . Acute respiratory failure ([place])   . Anemia    previous blood transfusions  . Arthritis    "all over"  . Asthma   . Bradycardia    requiring previous d/c of BB and reduction of amiodarone  . CAD (coronary artery disease)    nonobstructive per notes  . Chronic diastolic CHF (congestive heart failure) ([place])   . CKD (chronic kidney disease), stage III ([place])   . Complication of blood transfusion    "got the wrong blood type at [facility] in ~ 2015; no adverse reaction that we are aware of"/daughter, [name] (01/27/2016)  . COPD (chronic obstructive pulmonary disease) ([place])   . Depression    "light case"  . DVT (deep venous thrombosis) ([place]) 01/2016   a. LLE DVT 01/2016 - switched from Eliquis to Coumadin.  . Gastric stenosis    a. s/p stomach tube  . GERD (gastroesophageal reflux disease)   . History of blood transfusion    "several" (01/27/2016)  . History of stomach ulcers   . Hyperlipidemia   . Hypertension   . Hypothyroidism   . On home oxygen therapy    "2L; 9PM til 9AM" (06/27/2017)  . Paraesophageal hernia   . Perforated gastric ulcer ([place])   . Pneumonia    "a few times" (06/27/2017)  . Seasonal allergies   . SIADH (syndrome of inappropriate ADH production) ([place])    [name] 01/10/2015  . Small bowel obstruction ([place])    "I don't know how many" (01/11/2015)  . Stroke ([place])    "light one"  . Type II diabetes mellitus ([place])    "related to prednisone use  for > 20 yr; once predinose stopped; no more DM RX" (01/27/2016)  . UTI (urinary tract  infection) 02/08/2016  . Ventral hernia with bowel obstruction       Objective/Physical Exam General: The patient is alert and oriented x3 in no acute distress.  Dermatology:  Wound #1 noted to the right hallux measuring 1.0 x 1.0 x 0.4 cm (LxWxD).   Wound #2 noted to the right fourth toe measuring 0.6 x 0.6 x 0.4.  Upon debridement there is some purulent drainage noted to this ulceration site.  To the noted ulceration(s), there is no eschar. There is a moderate amount of slough, fibrin, and necrotic tissue noted. Granulation tissue and wound base is red. There is a minimal amount of serosanguineous drainage noted.  There does appear to be some exposed bone to both ulceration sites.  There is no malodor. Periwound integrity is intact. Skin is warm, dry and supple bilateral lower extremities.  Vascular: NonPalpable pedal pulses bilaterally. No edema or erythema noted.   Neurological: Epicritic and protective threshold diminished bilaterally.   Musculoskeletal Exam: Range of motion within normal limits to all pedal and ankle joints bilateral. Muscle strength 5/5 in all groups bilateral.   Assessment: #1 ulceration of the right hallux secondary to diabetes mellitus  #2 ulceration of the right fourth toe secondary to diabetes mellitus - healed  #3 diabetes mellitus w/ peripheral neuropathy   Plan of Care:  #1 Patient  was evaluated. #2 medically necessary excisional debridement including muscle and deep fascial tissue was performed using a tissue nipper and a chisel blade. Excisional debridement of all the necrotic nonviable tissue down to healthy bleeding viable tissue was performed with post-debridement measurements same as pre-. #3 the wound was cleansed and dry sterile dressing applied. #4 Continue using betadine daily with a dry sterile dressing.  #5 Continue weightbearing in post op shoe.  #6  Prescription for doxycycline 100 mg #20 #7 today deep wound cultures were taken and sent  to pathology for culture and sensitivity #8 return to clinic in 2 weeks   Edrick Kins, DPM [facility]  Dr. Edrick Kins, [address]                                        [place], [place] [address]                Office [phone]  Fax [phone]

## 2017-12-13 ENCOUNTER — Ambulatory Visit (INDEPENDENT_AMBULATORY_CARE_PROVIDER_SITE_OTHER): Payer: Medicare Other

## 2017-12-13 DIAGNOSIS — I4892 Unspecified atrial flutter: Secondary | ICD-10-CM

## 2017-12-13 DIAGNOSIS — Z5181 Encounter for therapeutic drug level monitoring: Secondary | ICD-10-CM | POA: Diagnosis not present

## 2017-12-13 LAB — WOUND CULTURE
MICRO NUMBER:: 91075537
SPECIMEN QUALITY:: ADEQUATE

## 2017-12-13 LAB — POCT INR: INR: 2.8 (ref 2.0–3.0)

## 2017-12-13 NOTE — Patient Instructions (Signed)
Description   Continue on same dosage 2 tablets daily except 1 tablet on Fridays, with the exception of this Monday take 1 tablet while on Doxycycline.  Recheck in 6 weeks.  Call [facility] with any new medications [phone].

## 2017-12-14 DIAGNOSIS — E119 Type 2 diabetes mellitus without complications: Secondary | ICD-10-CM | POA: Diagnosis not present

## 2017-12-14 DIAGNOSIS — H16223 Keratoconjunctivitis sicca, not specified as Sjogren's, bilateral: Secondary | ICD-10-CM | POA: Diagnosis not present

## 2017-12-17 ENCOUNTER — Encounter: Payer: Medicare Other | Attending: Physician Assistant | Admitting: Physician Assistant

## 2017-12-17 DIAGNOSIS — Z931 Gastrostomy status: Secondary | ICD-10-CM | POA: Diagnosis not present

## 2017-12-17 DIAGNOSIS — E11628 Type 2 diabetes mellitus with other skin complications: Secondary | ICD-10-CM | POA: Diagnosis not present

## 2017-12-17 DIAGNOSIS — I509 Heart failure, unspecified: Secondary | ICD-10-CM | POA: Diagnosis not present

## 2017-12-17 DIAGNOSIS — Z8719 Personal history of other diseases of the digestive system: Secondary | ICD-10-CM | POA: Insufficient documentation

## 2017-12-17 DIAGNOSIS — N183 Chronic kidney disease, stage 3 (moderate): Secondary | ICD-10-CM | POA: Diagnosis not present

## 2017-12-17 DIAGNOSIS — Z880 Allergy status to penicillin: Secondary | ICD-10-CM | POA: Insufficient documentation

## 2017-12-17 DIAGNOSIS — E1140 Type 2 diabetes mellitus with diabetic neuropathy, unspecified: Secondary | ICD-10-CM | POA: Diagnosis not present

## 2017-12-17 DIAGNOSIS — Z823 Family history of stroke: Secondary | ICD-10-CM | POA: Diagnosis not present

## 2017-12-17 DIAGNOSIS — J449 Chronic obstructive pulmonary disease, unspecified: Secondary | ICD-10-CM | POA: Insufficient documentation

## 2017-12-17 DIAGNOSIS — I130 Hypertensive heart and chronic kidney disease with heart failure and stage 1 through stage 4 chronic kidney disease, or unspecified chronic kidney disease: Secondary | ICD-10-CM | POA: Diagnosis not present

## 2017-12-17 DIAGNOSIS — Z86718 Personal history of other venous thrombosis and embolism: Secondary | ICD-10-CM | POA: Insufficient documentation

## 2017-12-17 DIAGNOSIS — T8189XA Other complications of procedures, not elsewhere classified, initial encounter: Secondary | ICD-10-CM | POA: Diagnosis not present

## 2017-12-17 DIAGNOSIS — K219 Gastro-esophageal reflux disease without esophagitis: Secondary | ICD-10-CM | POA: Diagnosis not present

## 2017-12-17 DIAGNOSIS — Z8249 Family history of ischemic heart disease and other diseases of the circulatory system: Secondary | ICD-10-CM | POA: Diagnosis not present

## 2017-12-17 DIAGNOSIS — E1122 Type 2 diabetes mellitus with diabetic chronic kidney disease: Secondary | ICD-10-CM | POA: Diagnosis not present

## 2017-12-17 DIAGNOSIS — I2510 Atherosclerotic heart disease of native coronary artery without angina pectoris: Secondary | ICD-10-CM | POA: Diagnosis not present

## 2017-12-17 DIAGNOSIS — L97529 Non-pressure chronic ulcer of other part of left foot with unspecified severity: Secondary | ICD-10-CM | POA: Insufficient documentation

## 2017-12-17 DIAGNOSIS — I499 Cardiac arrhythmia, unspecified: Secondary | ICD-10-CM | POA: Insufficient documentation

## 2017-12-17 DIAGNOSIS — B354 Tinea corporis: Secondary | ICD-10-CM | POA: Diagnosis not present

## 2017-12-17 DIAGNOSIS — L309 Dermatitis, unspecified: Secondary | ICD-10-CM | POA: Diagnosis not present

## 2017-12-17 DIAGNOSIS — Z881 Allergy status to other antibiotic agents status: Secondary | ICD-10-CM | POA: Diagnosis not present

## 2017-12-17 DIAGNOSIS — Z7901 Long term (current) use of anticoagulants: Secondary | ICD-10-CM | POA: Insufficient documentation

## 2017-12-19 NOTE — Progress Notes (Signed)
Shannon [name] ([id number]) Visit Report for 12/17/2017 Allergy List Details Patient Name: Shannon [name] Date of Service: 12/17/2017 10:30 AM Medical Record Number: [id number] Patient Account Number: [id number] Date of Birth/Sex: 1924/02/03 (84 y.o. F) Treating RN: Curtis Sites Primary Care Gery Sabedra: Montez Morita, MONICA Other Clinician: Referring Azir Muzyka: Montez Morita, MONICA Treating Trevione Wert/Extender: STONE III, HOYT Weeks in Treatment: 0 Allergies Active Allergies penicillin Allergy Notes Electronic Signature(s) Signed: 12/17/2017 9:58:50 AM By: Curtis Sites Entered By: Curtis Sites on 12/17/2017 09:58:50 Shannon [name] ([id number]) -------------------------------------------------------------------------------- Arrival Information Details Patient Name: Shannon [name] Date of Service: 12/17/2017 10:30 AM Medical Record Number: [id number] Patient Account Number: [id number] Date of Birth/Sex: 10-12-1923 (84 y.o. F) Treating RN: Curtis Sites Primary Care Dominik Yordy: Montez Morita, MONICA Other Clinician: Referring Peyten Punches: Montez Morita, MONICA Treating Latricia Cerrito/Extender: Linwood Dibbles, HOYT Weeks in Treatment: 0 Visit Information Patient Arrived: Walker Arrival Time: 10:52 Accompanied By: daughter Transfer Assistance: None Patient Identification Verified: Yes Secondary Verification Process Yes Completed: Patient Has Alerts: Yes Patient Alerts: Patient on Blood Thinner DMII warfarin History Since Last Visit Added or deleted any medications: No Any new allergies or adverse reactions: No Had a fall or experienced change in activities of daily living that may affect risk of falls: No Signs or symptoms of abuse/neglect since last visito No Hospitalized since last visit: No Implantable device outside of the clinic excluding cellular tissue based products placed in the center since last visit: No Electronic Signature(s) Signed: 12/17/2017 4:11:20 PM By: Curtis Sites Entered By: Curtis Sites on 12/17/2017  10:54:29 Shannon [name] ([id number]) -------------------------------------------------------------------------------- Clinic Level of Care Assessment Details Patient Name: Shannon [name] Date of Service: 12/17/2017 10:30 AM Medical Record Number: [id number] Patient Account Number: [id number] Date of Birth/Sex: Aug 07, 1923 (84 y.o. F) Treating RN: Huel Coventry Primary Care Jamere Stidham: Montez Morita, MONICA Other Clinician: Referring Tyeler Goedken: Montez Morita, MONICA Treating Kielyn Kardell/Extender: Linwood Dibbles, HOYT Weeks in Treatment: 0 Clinic Level of Care Assessment Items TOOL 2 Quantity Score []  - Use when only an EandM is performed on the INITIAL visit 0 ASSESSMENTS - Nursing Assessment / Reassessment X - General Physical Exam (combine w/ comprehensive assessment (listed just below) when 1 20 performed on new pt. evals) X- 1 25 Comprehensive Assessment (HX, ROS, Risk Assessments, Wounds Hx, etc.) ASSESSMENTS - Wound and Skin Assessment / Reassessment X - Simple Wound Assessment / Reassessment - one wound 1 5 []  - 0 Complex Wound Assessment / Reassessment - multiple wounds []  - 0 Dermatologic / Skin Assessment (not related to wound area) ASSESSMENTS - Ostomy and/or Continence Assessment and Care []  - Incontinence Assessment and Management 0 []  - 0 Ostomy Care Assessment and Management (repouching, etc.) PROCESS - Coordination of Care X - Simple Patient / Family Education for ongoing care 1 15 []  - 0 Complex (extensive) Patient / Family Education for ongoing care []  - 0 Staff obtains Chiropractor, Records, Test Results / Process Orders []  - 0 Staff telephones HHA, Nursing Homes / Clarify orders / etc []  - 0 Routine Transfer to another Facility (non-emergent condition) []  - 0 Routine Hospital Admission (non-emergent condition) []  - 0 New Admissions / Manufacturing engineer / Ordering NPWT, Apligraf, etc. []  - 0 Emergency Hospital Admission (emergent condition) X- 1 10 Simple Discharge Coordination []  -  0 Complex (extensive) Discharge Coordination PROCESS - Special Needs []  - Pediatric / Minor Patient Management 0 []  - 0 Isolation Patient Management Shannon [name] ([id number]) []  - 0 Hearing / Language / Visual special needs []  - 0 Assessment of Community assistance (transportation, D/C planning, etc.) []  - 0 Additional assistance /  Altered mentation []  - 0 Support Surface(s) Assessment (bed, cushion, seat, etc.) INTERVENTIONS - Wound Cleansing / Measurement X - Wound Imaging (photographs - any number of wounds) 1 5 []  - 0 Wound Tracing (instead of photographs) X- 1 5 Simple Wound Measurement - one wound []  - 0 Complex Wound Measurement - multiple wounds X- 1 5 Simple Wound Cleansing - one wound []  - 0 Complex Wound Cleansing - multiple wounds INTERVENTIONS - Wound Dressings []  - Small Wound Dressing one or multiple wounds 0 X- 1 15 Medium Wound Dressing one or multiple wounds []  - 0 Large Wound Dressing one or multiple wounds []  - 0 Application of Medications - injection INTERVENTIONS - Miscellaneous []  - External ear exam 0 []  - 0 Specimen Collection (cultures, biopsies, blood, body fluids, etc.) []  - 0 Specimen(s) / Culture(s) sent or taken to Lab for analysis []  - 0 Patient Transfer (multiple staff / Nurse, adult / Similar devices) []  - 0 Simple Staple / Suture removal (25 or less) []  - 0 Complex Staple / Suture removal (26 or more) []  - 0 Hypo / Hyperglycemic Management (close monitor of Blood Glucose) []  - 0 Ankle / Brachial Index (ABI) - do not check if billed separately Has the patient been seen at the hospital within the last three years: Yes Total Score: 105 Level Of Care: New/Established - Level 3 Electronic Signature(s) Signed: 12/17/2017 4:52:26 PM By: Elliot Gurney, BSN, RN, CWS, Kim RN, BSN Entered By: Elliot Gurney, BSN, RN, CWS, Kim on 12/17/2017 11:15:46 Shannon [name]  ([id number]) -------------------------------------------------------------------------------- Encounter Discharge Information Details Patient Name: Shannon [name] Date of Service: 12/17/2017 10:30 AM Medical Record Number: [id number] Patient Account Number: [id number] Date of Birth/Sex: 1923/07/30 (84 y.o. F) Treating RN: Curtis Sites Primary Care Kylei Purington: Montez Morita, MONICA Other Clinician: Referring Catina Nuss: Montez Morita, MONICA Treating Kenrick Pore/Extender: Linwood Dibbles, HOYT Weeks in Treatment: 0 Encounter Discharge Information Items Discharge Condition: Stable Ambulatory Status: Walker Discharge Destination: Home Transportation: Private Auto Accompanied By: dtr Schedule Follow-up Appointment: Yes Clinical Summary of Care: Electronic Signature(s) Signed: 12/17/2017 11:34:56 AM By: Curtis Sites Entered By: Curtis Sites on 12/17/2017 11:34:56 Shannon [name] ([id number]) -------------------------------------------------------------------------------- Lower Extremity Assessment Details Patient Name: Shannon [name] Date of Service: 12/17/2017 10:30 AM Medical Record Number: [id number] Patient Account Number: [id number] Date of Birth/Sex: [birth year] (84 y.o. F) Treating RN: Curtis Sites Primary Care Raven Furnas: Kirt Boys Other Clinician: Referring Mckynleigh Mussell: Montez Morita, MONICA Treating Shalawn Wynder/Extender: Linwood Dibbles, HOYT Weeks in Treatment: 0 Electronic Signature(s) Signed: 12/17/2017 4:11:20 PM By: Curtis Sites Entered By: Curtis Sites on 12/17/2017 11:04:08 Shannon [name] ([id number]) -------------------------------------------------------------------------------- Multi-Disciplinary Care Plan Details Patient Name: Shannon [name] Date of Service: 12/17/2017 10:30 AM Medical Record Number: [id number] Patient Account Number: [id number] Date of Birth/Sex: [birth year] (84 y.o. F) Treating RN: Huel Coventry Primary Care Alaycia Eardley: Montez Morita, MONICA Other Clinician: Referring Raine Blodgett: Montez Morita, MONICA Treating  Maddix Kliewer/Extender: Linwood Dibbles, HOYT Weeks in Treatment: 0 Active Inactive ` Orientation to the Wound Care Program Nursing Diagnoses: Knowledge deficit related to the wound healing center program Goals: Patient/caregiver will verbalize understanding of the Wound Healing Center Program Date Initiated: 12/17/2017 Target Resolution Date: 01/04/2018 Goal Status: Active Interventions: Provide education on orientation to the wound center Notes: ` Wound/Skin Impairment Nursing Diagnoses: Impaired tissue integrity Goals: Patient/caregiver will verbalize understanding of skin care regimen Date Initiated: 12/17/2017 Target Resolution Date: 01/04/2018 Goal Status: Active Ulcer/skin breakdown will have a volume reduction of 30% by week 4 Date Initiated: 12/17/2017 Target Resolution Date: 01/04/2018 Goal Status: Active Interventions: Assess ulceration(s) every visit Treatment Activities: Skin care  regimen initiated : 12/17/2017 Notes: Electronic Signature(s) Signed: 12/17/2017 4:52:26 PM By: Elliot Gurney, BSN, RN, CWS, Kim RN, BSN Entered By: Elliot Gurney, BSN, RN, CWS, Kim on 12/17/2017 11:13:38 Shannon [name] ([id number]) -------------------------------------------------------------------------------- Non-Wound Condition Assessment Details Patient Name: Shannon [name] Date of Service: 12/17/2017 10:30 AM Medical Record Number: [id number] Patient Account Number: [id number] Date of Birth/Sex: 1924-01-27 (84 y.o. F) Treating RN: Curtis Sites Primary Care Daisean Brodhead: Montez Morita, MONICA Other Clinician: Referring Jaionna Weisse: Montez Morita, MONICA Treating Jaylyn Iyer/Extender: STONE III, HOYT Weeks in Treatment: 0 Non-Wound Condition: Condition: Rash / Dermatitis Location: Abdomen Side: Left Photos Periwound Skin Texture Texture Color No Abnormalities Noted: No No Abnormalities Noted: No Callus: No Atrophie Blanche: No Crepitus: No Cyanosis: No Excoriation: No Ecchymosis: No Friable: No Erythema: Yes Induration:  No Erythema Location: Circumferential Rash: No Hemosiderin Staining: No Scarring: No Mottled: No Pallor: No Moisture Rubor: No No Abnormalities Noted: No Dry / Scaly: No Temperature / Pain Maceration: Yes Tenderness on Palpation: Yes Notes g tube site has weeping rash around it with odor; area was cleaned with soap and water Electronic Signature(s) Signed: 12/17/2017 4:11:20 PM By: Curtis Sites Entered By: Curtis Sites on 12/17/2017 14:46:54 Shannon [name] ([id number]) -------------------------------------------------------------------------------- Pain Assessment Details Patient Name: Shannon [name] Date of Service: 12/17/2017 10:30 AM Medical Record Number: [id number] Patient Account Number: [id number] Date of Birth/Sex: February 19, 1924 (84 y.o. F) Treating RN: Curtis Sites Primary Care Inocencio Roy: Montez Morita, MONICA Other Clinician: Referring Almando Brawley: Montez Morita, MONICA Treating Ricco Dershem/Extender: Linwood Dibbles, HOYT Weeks in Treatment: 0 Active Problems Location of Pain Severity and Description of Pain Patient Has Paino Yes Site Locations Pain Location: Pain in Ulcers With Dressing Change: Yes Duration of the Pain. Constant / Intermittento Constant Character of Pain Describe the Pain: Other: sore Pain Management and Medication Current Pain Management: Electronic Signature(s) Signed: 12/17/2017 4:11:20 PM By: Curtis Sites Entered By: Curtis Sites on 12/17/2017 10:54:47 Shannon [name] ([id number]) -------------------------------------------------------------------------------- Patient/Caregiver Education Details Patient Name: Shannon [name] Date of Service: 12/17/2017 10:30 AM Medical Record Number: [id number] Patient Account Number: [id number] Date of Birth/Gender: 29-Oct-1923 (84 y.o. F) Treating RN: Curtis Sites Primary Care Physician: Montez Morita, MONICA Other Clinician: Referring Physician: Montez Morita, MONICA Treating Physician/Extender: Skeet Simmer in Treatment: 0 Education  Assessment Education Provided To: Patient and Caregiver Education Topics Provided Wound/Skin Impairment: Handouts: Other: rash care as ordered Methods: Demonstration, Explain/Verbal Responses: State content correctly Electronic Signature(s) Signed: 12/17/2017 4:11:20 PM By: Curtis Sites Entered By: Curtis Sites on 12/17/2017 11:35:20 Shannon [name] ([id number]) -------------------------------------------------------------------------------- Vitals Details Patient Name: Shannon [name] Date of Service: 12/17/2017 10:30 AM Medical Record Number: [id number] Patient Account Number: [id number] Date of Birth/Sex: April 20, 1923 (84 y.o. F) Treating RN: Curtis Sites Primary Care Corazon Nickolas: Montez Morita, MONICA Other Clinician: Referring Marchel Foote: Montez Morita, MONICA Treating Janani Chamber/Extender: STONE III, HOYT Weeks in Treatment: 0 Vital Signs Time Taken: 10:50 Temperature (F): 98.3 Pulse (bpm): 68 Respiratory Rate (breaths/min): 16 Blood Pressure (mmHg): 160/58 Reference Range: 80 - 120 mg / dl Electronic Signature(s) Signed: 12/17/2017 4:11:20 PM By: Curtis Sites Entered By: Curtis Sites on 12/17/2017 10:55:21

## 2017-12-19 NOTE — Progress Notes (Signed)
Shannon [name], Shannon [name] ([id number]) Visit Report for 12/17/2017 Abuse/Suicide Risk Screen Details Patient Name: Shannon [name], Shannon [name] Date of Service: 12/17/2017 10:30 AM Medical Record Number: [id number] Patient Account Number: [id number] Date of Birth/Sex: Nov 18, 1923 (84 y.o. F) Treating RN: Curtis Sites Primary Care Teah Votaw: Montez Morita, MONICA Other Clinician: Referring Jacklynn Dehaas: Montez Morita, MONICA Treating Stephanny Tsutsui/Extender: Linwood Dibbles, HOYT Weeks in Treatment: 0 Abuse/Suicide Risk Screen Items Answer ABUSE/SUICIDE RISK SCREEN: Has anyone close to you tried to hurt or harm you recentlyo No Do you feel uncomfortable with anyone in your familyo No Has anyone forced you do things that you didnot want to doo No Do you have any thoughts of harming yourselfo No Patient displays signs or symptoms of abuse and/or neglect. No Electronic Signature(s) Signed: 12/17/2017 10:00:22 AM By: Curtis Sites Entered By: Curtis Sites on 12/17/2017 10:00:22 Shannon [name] ([id number]) -------------------------------------------------------------------------------- Activities of Daily Living Details Patient Name: Shannon [name] Date of Service: 12/17/2017 10:30 AM Medical Record Number: [id number] Patient Account Number: [id number] Date of Birth/Sex: 08/18/1923 (84 y.o. F) Treating RN: Curtis Sites Primary Care Buryl Bamber: Montez Morita, MONICA Other Clinician: Referring Emerita Berkemeier: Montez Morita, MONICA Treating Sonia Stickels/Extender: Linwood Dibbles, HOYT Weeks in Treatment: 0 Activities of Daily Living Items Answer Activities of Daily Living (Please select one for each item) Drive Automobile Not Able Take Medications Need Assistance Use Telephone Need Assistance Care for Appearance Need Assistance Use Toilet Need Assistance Bath / Shower Need Assistance Dress Self Need Assistance Feed Self Completely Able Walk Need Assistance Get In / Out Bed Need Assistance Housework Need Assistance Prepare Meals Need Assistance Handle Money Need  Assistance Shop for Self Need Assistance Electronic Signature(s) Signed: 12/17/2017 4:11:20 PM By: Curtis Sites Entered By: Curtis Sites on 12/17/2017 11:03:05 Shannon [name] ([id number]) -------------------------------------------------------------------------------- Education Assessment Details Patient Name: Shannon [name] Date of Service: 12/17/2017 10:30 AM Medical Record Number: [id number] Patient Account Number: [id number] Date of Birth/Sex: March 19, 1924 (84 y.o. F) Treating RN: Curtis Sites Primary Care Carston Riedl: Montez Morita, MONICA Other Clinician: Referring Cheyene Hamric: Montez Morita, MONICA Treating Alvenia Treese/Extender: Linwood Dibbles, HOYT Weeks in Treatment: 0 Primary Learner Assessed: Caregiver dtr Reason Patient is not Primary Learner: wound location Learning Preferences/Education Level/Primary Language Learning Preference: Explanation, Demonstration Highest Education Level: College or Above Preferred Language: English Cognitive Barrier Assessment/Beliefs Language Barrier: No Translator Needed: No Memory Deficit: No Emotional Barrier: No Cultural/Religious Beliefs Affecting Medical Care: No Physical Barrier Assessment Impaired Vision: No Impaired Hearing: No Decreased Hand dexterity: No Knowledge/Comprehension Assessment Knowledge Level: Medium Comprehension Level: Medium Ability to understand written Medium instructions: Ability to understand verbal Medium instructions: Motivation Assessment Anxiety Level: Calm Cooperation: Cooperative Education Importance: Acknowledges Need Interest in Health Problems: Asks Questions Perception: Coherent Willingness to Engage in Self- Medium Management Activities: Readiness to Engage in Self- Medium Management Activities: Electronic Signature(s) Signed: 12/17/2017 4:11:20 PM By: Curtis Sites Entered By: Curtis Sites on 12/17/2017 11:03:29 Shannon [name]  ([id number]) -------------------------------------------------------------------------------- Fall Risk Assessment Details Patient Name: Shannon [name] Date of Service: 12/17/2017 10:30 AM Medical Record Number: [id number] Patient Account Number: [id number] Date of Birth/Sex: [birth year] (84 y.o. F) Treating RN: Curtis Sites Primary Care Tawania Daponte: Montez Morita, MONICA Other Clinician: Referring Correna Meacham: Montez Morita, MONICA Treating [place]/Extender: Linwood Dibbles, HOYT Weeks in Treatment: 0 Fall Risk Assessment Items Have you had 2 or more falls in the last 12 monthso 0 No Have you had any fall that resulted in injury in the last 12 monthso 0 No FALL RISK ASSESSMENT: History of falling - immediate or within 3 months 0 No Secondary diagnosis 0 No Ambulatory aid None/bed rest/wheelchair/nurse 0 No Crutches/cane/walker 15 Yes  Furniture 0 No IV Access/Saline Lock 0 No Gait/Training Normal/bed rest/immobile 0 No Weak 10 Yes Impaired 20 Yes Mental Status Oriented to own ability 0 Yes Electronic Signature(s) Signed: 12/17/2017 4:11:20 PM By: Curtis Sites Entered By: Curtis Sites on 12/17/2017 11:03:40 Shannon [name] ([id number]) -------------------------------------------------------------------------------- Foot Assessment Details Patient Name: Shannon [name] Date of Service: 12/17/2017 10:30 AM Medical Record Number: [id number] Patient Account Number: [id number] Date of Birth/Sex: 1923/12/04 (84 y.o. F) Treating RN: Curtis Sites Primary Care River Ambrosio: Montez Morita, MONICA Other Clinician: Referring Betty Daidone: Montez Morita, MONICA Treating Essie Lagunes/Extender: Linwood Dibbles, HOYT Weeks in Treatment: 0 Foot Assessment Items Site Locations + = Sensation present, - = Sensation absent, C = Callus, U = Ulcer R = Redness, W = Warmth, M = Maceration, PU = Pre-ulcerative lesion F = Fissure, S = Swelling, D = Dryness Assessment Right: Left: Other Deformity: No No Prior Foot Ulcer: No No Prior Amputation: No  No Charcot Joint: No No Ambulatory Status: Ambulatory With Help Assistance Device: Walker Gait: Surveyor, mining) Signed: 12/17/2017 4:11:20 PM By: Curtis Sites Entered By: Curtis Sites on 12/17/2017 11:03:57 Shannon [name] ([id number]) -------------------------------------------------------------------------------- Nutrition Risk Assessment Details Patient Name: Shannon [name] Date of Service: 12/17/2017 10:30 AM Medical Record Number: [id number] Patient Account Number: [id number] Date of Birth/Sex: 05/31/1923 (84 y.o. F) Treating RN: Curtis Sites Primary Care Masae Lukacs: Montez Morita, MONICA Other Clinician: Referring Johannah Rozas: Montez Morita, MONICA Treating Jaelene Garciagarcia/Extender: STONE III, HOYT Weeks in Treatment: 0 Height (in): Weight (lbs): Body Mass Index (BMI): Nutrition Risk Assessment Items NUTRITION RISK SCREEN: I have an illness or condition that made me change the kind and/or amount of 0 No food I eat I eat fewer than two meals per day 0 No I eat few fruits and vegetables, or milk products 0 No I have three or more drinks of beer, liquor or wine almost every day 0 No I have tooth or mouth problems that make it hard for me to eat 0 No I don't always have enough money to buy the food I need 0 No I eat alone most of the time 0 No I take three or more different prescribed or over-the-counter drugs a day 1 Yes Without wanting to, I have lost or gained 10 pounds in the last six months 0 No I am not always physically able to shop, cook and/or feed myself 0 No Nutrition Protocols Good Risk Protocol 0 No interventions needed Moderate Risk Protocol Electronic Signature(s) Signed: 12/17/2017 4:11:20 PM By: Curtis Sites Entered By: Curtis Sites on 12/17/2017 11:03:46

## 2017-12-19 NOTE — Progress Notes (Signed)
region but she has not used any around the G-tube site. She  does have stage III renal failure. The area does bother her as far as pain is concerned around the G-tube site. Electronic Signature(s) Signed: 12/17/2017 12:10:32 PM By: Worthy Keeler PA-C Entered By: Worthy Keeler on 12/17/2017 12:01:58 Shannon [name] ([id number]) -------------------------------------------------------------------------------- Physical Exam Details Patient Name: Shannon [name] Date of Service: 12/17/2017 10:30 AM Medical Record Number: [id number] Patient Account Number: [id number] Date of Birth/Sex: [birth year] (84 y.o. F) Treating RN: Cornell Barman Primary Care Provider: Eulas Post, MONICA Other Clinician: Referring Provider: Eulas Post, MONICA Treating Provider/Extender: Melburn Hake, HOYT Weeks in Treatment: 0 Constitutional patient is hypertensive.. pulse regular and within target range for patient.[name] respirations regular, non-labored and within target range for patient.[name] temperature within target range for patient.. Well-nourished and well-hydrated in no acute distress. Eyes conjunctiva clear no eyelid edema noted. pupils equal round and reactive to light and accommodation. Ears, Nose, Mouth, and Throat no gross abnormality of ear auricles or external auditory canals. normal hearing noted during conversation. mucus membranes moist. Respiratory normal breathing without difficulty. clear to auscultation bilaterally. Cardiovascular regular rate and rhythm with normal S1, S2. trace pitting edema of the bilateral lower extremities. Gastrointestinal (GI) soft, non-tender, non-distended, +BS. no ventral hernia noted. Musculoskeletal Patient unable to walk without assistance. Psychiatric this patient is able to make decisions and demonstrates good insight into disease process. Alert and Oriented x 3. pleasant and cooperative. Notes On inspection today patient has significant erythema surrounding the G-tube site which is painful. This actually appears to be mainly fungal in nature in  my Opinion. Especially since it seems that this area staying very wet according to the patient's daughter. There is no evidence of specific bacterial cellulitis although that something we will definitely have to keep an eye on. Electronic Signature(s) Signed: 12/17/2017 12:10:32 PM By: Worthy Keeler PA-C Entered By: Worthy Keeler on 12/17/2017 12:04:03 Shannon [name] ([id number]) -------------------------------------------------------------------------------- Physician Orders Details Patient Name: Shannon [name] Date of Service: 12/17/2017 10:30 AM Medical Record Number: [id number] Patient Account Number: [id number] Date of Birth/Sex: 1923/06/01 (84 y.o. F) Treating RN: Cornell Barman Primary Care Provider: Eulas Post, MONICA Other Clinician: Referring Provider: Eulas Post, MONICA Treating Provider/Extender: Melburn Hake, HOYT Weeks in Treatment: 0 Verbal / Phone Orders: No Diagnosis Coding Wound Cleansing o Clean wound with Normal Saline. Skin Barriers/Peri-Wound Care o Antifungal powder-Nystatin Primary Wound Dressing o Silver Alginate Secondary Dressing o Drawtex Dressing Change Frequency o Change dressing every other day. Follow-up Appointments o Return Appointment in 1 week. Medications-please add to medication list. o Other: - Nystatin Powder Patient Medications Allergies: penicillin Notifications Medication Indication Start End nystatin 12/17/2017 DOSE topical 100,000 unit/gram powder - powder topical applied to the g-tubs site peri-wound then cover with the dressing as directed. Electronic Signature(s) Signed: 12/17/2017 12:09:20 PM By: Worthy Keeler PA-C Entered By: Worthy Keeler on 12/17/2017 12:09:19 Shannon [name] ([id number]) -------------------------------------------------------------------------------- Problem List Details Patient Name: Shannon [name] Date of Service: 12/17/2017 10:30 AM Medical Record Number: [id number] Patient Account Number: [id number] Date of  Birth/Sex: 1923-12-12 (84 y.o. 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 F) Treating RN: Cornell Barman Primary Care Provider: Gildardo Cranker Other Clinician: Referring Provider: Eulas Post, MONICA Treating Provider/Extender: Melburn Hake, HOYT Weeks in Treatment: 0 Subjective Chief Complaint Information obtained from Patient Rash around G-tube site History of Present Illness (HPI) 08/08/16; this is a 84 year old women who arrives in our clinic referred from her podiatrist Dr. Amalia Hailey. She is a type 2 diabetic with PAD who recently came to the attention of vein and vascular in [place]. She presented with a black toe on the left foot. She was admitted to hospital with this from 07/28/16-08/01/16. MRI of the foot suggested a tiny subcutaneous abscess at the base of the second proximal phalynx suggested osteomyelitis. She did not wish to consider amputation or angiography. She received vanc and cephapime in the hospital discharged on clindamycin for a month. She is on Coumadin. Stage 3  crf, Previous non invasive arterial studies showed diffuse plaque with circumferential calcified vessels. monophasic waveforms in the superficial femoral artery and below. Patient is in moderate pain. They are only using dial soaks. and gauze. 08/22/16 Patient returns to clinic with ischemic wounds on her left 2nd,3rd, and first toes. She denies severe pain,fever or other systemic symptoms. Tells me she was in hospital for pneumonia. I will check these records. She states she received different antibiotics from the clindamycin she was on. she has been using silver alginate. [facility] has [facility]. She again tells me she does not wish to have attempts at revascularization or amputation. She does not feel she wound survive the surgery. 09/05/16; the patient continues to have gradual deterioration in the condition of the left foot. She has mummification of the left second toe wound on the left third toe, necrotic tissue on the plantar aspect of the left second toe. She had previously refused angiography or amputation. She was seen by vein and vascular in [place]. She is using silver alginate. She seems a little more accepting of the thought of surgery today I think she has been talking to her family. She asked me at what level her leg would need to be amputated Readmission: 12/17/17 patient is seen for reevaluation today in the clinic although for a different issue. She was previously seen for toe ulcers although she has been seen by Dr. Amalia Hailey and had amputation and multiple locations were not looking at this at all today. The reason she comes in today is evaluation of her G-tube site where she has a lot of irritation and drainage around the edge of this. She does have a history of diabetes mellitus type II she is on medicine for this and is a hemoglobin A1c of 6.8 noted on 12/10/17. She also is on chronic Coumadin therapy at this point in doing well in that regard according to her family. She's  previously used nystatin powder under her breasts when she had some bread/irritated region but she has not used any around the G-tube site. She does have stage III renal failure. The area does bother her as far as pain is concerned around the G-tube site. Wound History Patient presents with 1 open wound that has been present for approximately 6 weeks. Patient has been treating wound in the following  Problems Electronic Signature(s) Signed: 12/17/2017 12:10:32 PM By: Worthy Keeler PA-C Entered By: Worthy Keeler on 12/17/2017 11:22:11 Shannon [name] ([id number]) -------------------------------------------------------------------------------- Progress Note Details Patient Name: Shannon [name] Date of Service: 12/17/2017 10:30 AM Medical Record Number: [id number] Patient Account Number: [id number] Date of Birth/Sex: 02/03/1924 (84 y.o. F) Treating RN: Cornell Barman Primary Care Provider: Gildardo Cranker Other Clinician: Referring Provider: Eulas Post, MONICA Treating Provider/Extender: Melburn Hake, HOYT Weeks in Treatment: 0 Subjective Chief Complaint Information obtained from Patient Rash around G-tube site History of Present Illness (HPI) 08/08/16; this is a 84 year old women who arrives in our clinic referred from her podiatrist Dr. Amalia Hailey. She is a type 2 diabetic with PAD who recently came to the attention of vein and vascular in [place]. She presented with a black toe on the left foot. She was admitted to hospital with this from 07/28/16-08/01/16. MRI of the foot suggested a tiny subcutaneous abscess at the base of the second proximal phalynx suggested osteomyelitis. She did not wish to consider amputation or angiography. She received vanc and cephapime in the hospital discharged on clindamycin for a month. She is on Coumadin. Stage 3  crf, Previous non invasive arterial studies showed diffuse plaque with circumferential calcified vessels. monophasic waveforms in the superficial femoral artery and below. Patient is in moderate pain. They are only using dial soaks. and gauze. 08/22/16 Patient returns to clinic with ischemic wounds on her left 2nd,3rd, and first toes. She denies severe pain,fever or other systemic symptoms. Tells me she was in hospital for pneumonia. I will check these records. She states she received different antibiotics from the clindamycin she was on. she has been using silver alginate. [facility]. She again tells me she does not wish to have attempts at revascularization or amputation. She does not feel she wound survive the surgery. 09/05/16; the patient continues to have gradual deterioration in the condition of the left foot. She has mummification of the left second toe wound on the left third toe, necrotic tissue on the plantar aspect of the left second toe. She had previously refused angiography or amputation. She was seen by vein and vascular in [place]. She is using silver alginate. She seems a little more accepting of the thought of surgery today I think she has been talking to her family. She asked me at what level her leg would need to be amputated Readmission: 12/17/17 patient is seen for reevaluation today in the clinic although for a different issue. She was previously seen for toe ulcers although she has been seen by Dr. Amalia Hailey and had amputation and multiple locations were not looking at this at all today. The reason she comes in today is evaluation of her G-tube site where she has a lot of irritation and drainage around the edge of this. She does have a history of diabetes mellitus type II she is on medicine for this and is a hemoglobin A1c of 6.8 noted on 12/10/17. She also is on chronic Coumadin therapy at this point in doing well in that regard according to her family. She's  previously used nystatin powder under her breasts when she had some bread/irritated region but she has not used any around the G-tube site. She does have stage III renal failure. The area does bother her as far as pain is concerned around the G-tube site. Wound History Patient presents with 1 open wound that has been present for approximately 6 weeks. Patient has been treating wound in the following  Problems Electronic Signature(s) Signed: 12/17/2017 12:10:32 PM By: Worthy Keeler PA-C Entered By: Worthy Keeler on 12/17/2017 11:22:11 Shannon [name] ([id number]) -------------------------------------------------------------------------------- Progress Note Details Patient Name: Shannon [name] Date of Service: 12/17/2017 10:30 AM Medical Record Number: [id number] Patient Account Number: [id number] Date of Birth/Sex: 02/03/1924 (84 y.o. F) Treating RN: Cornell Barman Primary Care Provider: Gildardo Cranker Other Clinician: Referring Provider: Eulas Post, MONICA Treating Provider/Extender: Melburn Hake, HOYT Weeks in Treatment: 0 Subjective Chief Complaint Information obtained from Patient Rash around G-tube site History of Present Illness (HPI) 08/08/16; this is a 84 year old women who arrives in our clinic referred from her podiatrist Dr. Amalia Hailey. She is a type 2 diabetic with PAD who recently came to the attention of vein and vascular in [place]. She presented with a black toe on the left foot. She was admitted to hospital with this from 07/28/16-08/01/16. MRI of the foot suggested a tiny subcutaneous abscess at the base of the second proximal phalynx suggested osteomyelitis. She did not wish to consider amputation or angiography. She received vanc and cephapime in the hospital discharged on clindamycin for a month. She is on Coumadin. Stage 3  crf, Previous non invasive arterial studies showed diffuse plaque with circumferential calcified vessels. monophasic waveforms in the superficial femoral artery and below. Patient is in moderate pain. They are only using dial soaks. and gauze. 08/22/16 Patient returns to clinic with ischemic wounds on her left 2nd,3rd, and first toes. She denies severe pain,fever or other systemic symptoms. Tells me she was in hospital for pneumonia. I will check these records. She states she received different antibiotics from the clindamycin she was on. she has been using silver alginate. [facility]. She again tells me she does not wish to have attempts at revascularization or amputation. She does not feel she wound survive the surgery. 09/05/16; the patient continues to have gradual deterioration in the condition of the left foot. She has mummification of the left second toe wound on the left third toe, necrotic tissue on the plantar aspect of the left second toe. She had previously refused angiography or amputation. She was seen by vein and vascular in [place]. She is using silver alginate. She seems a little more accepting of the thought of surgery today I think she has been talking to her family. She asked me at what level her leg would need to be amputated Readmission: 12/17/17 patient is seen for reevaluation today in the clinic although for a different issue. She was previously seen for toe ulcers although she has been seen by Dr. Amalia Hailey and had amputation and multiple locations were not looking at this at all today. The reason she comes in today is evaluation of her G-tube site where she has a lot of irritation and drainage around the edge of this. She does have a history of diabetes mellitus type II she is on medicine for this and is a hemoglobin A1c of 6.8 noted on 12/10/17. She also is on chronic Coumadin therapy at this point in doing well in that regard according to her family. She's  previously used nystatin powder under her breasts when she had some bread/irritated region but she has not used any around the G-tube site. She does have stage III renal failure. The area does bother her as far as pain is concerned around the G-tube site. Wound History Patient presents with 1 open wound that has been present for approximately 6 weeks. Patient has been treating wound in the following  region but she has not used any around the G-tube site. She  does have stage III renal failure. The area does bother her as far as pain is concerned around the G-tube site. Electronic Signature(s) Signed: 12/17/2017 12:10:32 PM By: Worthy Keeler PA-C Entered By: Worthy Keeler on 12/17/2017 12:01:58 Shannon [name] ([id number]) -------------------------------------------------------------------------------- Physical Exam Details Patient Name: Shannon [name] Date of Service: 12/17/2017 10:30 AM Medical Record Number: [id number] Patient Account Number: [id number] Date of Birth/Sex: [birth year] (84 y.o. F) Treating RN: Cornell Barman Primary Care Provider: Eulas Post, MONICA Other Clinician: Referring Provider: Eulas Post, MONICA Treating Provider/Extender: Melburn Hake, HOYT Weeks in Treatment: 0 Constitutional patient is hypertensive.. pulse regular and within target range for patient.[name] respirations regular, non-labored and within target range for patient.[name] temperature within target range for patient.. Well-nourished and well-hydrated in no acute distress. Eyes conjunctiva clear no eyelid edema noted. pupils equal round and reactive to light and accommodation. Ears, Nose, Mouth, and Throat no gross abnormality of ear auricles or external auditory canals. normal hearing noted during conversation. mucus membranes moist. Respiratory normal breathing without difficulty. clear to auscultation bilaterally. Cardiovascular regular rate and rhythm with normal S1, S2. trace pitting edema of the bilateral lower extremities. Gastrointestinal (GI) soft, non-tender, non-distended, +BS. no ventral hernia noted. Musculoskeletal Patient unable to walk without assistance. Psychiatric this patient is able to make decisions and demonstrates good insight into disease process. Alert and Oriented x 3. pleasant and cooperative. Notes On inspection today patient has significant erythema surrounding the G-tube site which is painful. This actually appears to be mainly fungal in nature in  my Opinion. Especially since it seems that this area staying very wet according to the patient's daughter. There is no evidence of specific bacterial cellulitis although that something we will definitely have to keep an eye on. Electronic Signature(s) Signed: 12/17/2017 12:10:32 PM By: Worthy Keeler PA-C Entered By: Worthy Keeler on 12/17/2017 12:04:03 Shannon [name] ([id number]) -------------------------------------------------------------------------------- Physician Orders Details Patient Name: Shannon [name] Date of Service: 12/17/2017 10:30 AM Medical Record Number: [id number] Patient Account Number: [id number] Date of Birth/Sex: 1923/06/01 (84 y.o. F) Treating RN: Cornell Barman Primary Care Provider: Eulas Post, MONICA Other Clinician: Referring Provider: Eulas Post, MONICA Treating Provider/Extender: Melburn Hake, HOYT Weeks in Treatment: 0 Verbal / Phone Orders: No Diagnosis Coding Wound Cleansing o Clean wound with Normal Saline. Skin Barriers/Peri-Wound Care o Antifungal powder-Nystatin Primary Wound Dressing o Silver Alginate Secondary Dressing o Drawtex Dressing Change Frequency o Change dressing every other day. Follow-up Appointments o Return Appointment in 1 week. Medications-please add to medication list. o Other: - Nystatin Powder Patient Medications Allergies: penicillin Notifications Medication Indication Start End nystatin 12/17/2017 DOSE topical 100,000 unit/gram powder - powder topical applied to the g-tubs site peri-wound then cover with the dressing as directed. Electronic Signature(s) Signed: 12/17/2017 12:09:20 PM By: Worthy Keeler PA-C Entered By: Worthy Keeler on 12/17/2017 12:09:19 Shannon [name] ([id number]) -------------------------------------------------------------------------------- Problem List Details Patient Name: Shannon [name] Date of Service: 12/17/2017 10:30 AM Medical Record Number: [id number] Patient Account Number: [id number] Date of  Birth/Sex: 1923-12-12 (84 y.o. F) Treating RN: Cornell Barman Primary Care Provider: Gildardo Cranker Other Clinician: Referring Provider: Eulas Post, MONICA Treating Provider/Extender: Melburn Hake, HOYT Weeks in Treatment: 0 Active Problems ICD-10 Evaluated Encounter Code Description Active Date Today Diagnosis B35.4 Tinea corporis 12/17/2017 No Yes E11.628 Type 2 diabetes mellitus with other skin complications 3/81/0175 No Yes Z93.1 Gastrostomy status 12/17/2017 No Yes Z79.01 Long term (current) use of anticoagulants 12/17/2017 No Yes Inactive Problems Resolved  region but she has not used any around the G-tube site. She  does have stage III renal failure. The area does bother her as far as pain is concerned around the G-tube site. Electronic Signature(s) Signed: 12/17/2017 12:10:32 PM By: Worthy Keeler PA-C Entered By: Worthy Keeler on 12/17/2017 12:01:58 Shannon [name] ([id number]) -------------------------------------------------------------------------------- Physical Exam Details Patient Name: Shannon [name] Date of Service: 12/17/2017 10:30 AM Medical Record Number: [id number] Patient Account Number: [id number] Date of Birth/Sex: [birth year] (84 y.o. F) Treating RN: Cornell Barman Primary Care Provider: Eulas Post, MONICA Other Clinician: Referring Provider: Eulas Post, MONICA Treating Provider/Extender: Melburn Hake, HOYT Weeks in Treatment: 0 Constitutional patient is hypertensive.. pulse regular and within target range for patient.[name] respirations regular, non-labored and within target range for patient.[name] temperature within target range for patient.. Well-nourished and well-hydrated in no acute distress. Eyes conjunctiva clear no eyelid edema noted. pupils equal round and reactive to light and accommodation. Ears, Nose, Mouth, and Throat no gross abnormality of ear auricles or external auditory canals. normal hearing noted during conversation. mucus membranes moist. Respiratory normal breathing without difficulty. clear to auscultation bilaterally. Cardiovascular regular rate and rhythm with normal S1, S2. trace pitting edema of the bilateral lower extremities. Gastrointestinal (GI) soft, non-tender, non-distended, +BS. no ventral hernia noted. Musculoskeletal Patient unable to walk without assistance. Psychiatric this patient is able to make decisions and demonstrates good insight into disease process. Alert and Oriented x 3. pleasant and cooperative. Notes On inspection today patient has significant erythema surrounding the G-tube site which is painful. This actually appears to be mainly fungal in nature in  my Opinion. Especially since it seems that this area staying very wet according to the patient's daughter. There is no evidence of specific bacterial cellulitis although that something we will definitely have to keep an eye on. Electronic Signature(s) Signed: 12/17/2017 12:10:32 PM By: Worthy Keeler PA-C Entered By: Worthy Keeler on 12/17/2017 12:04:03 Shannon [name] ([id number]) -------------------------------------------------------------------------------- Physician Orders Details Patient Name: Shannon [name] Date of Service: 12/17/2017 10:30 AM Medical Record Number: [id number] Patient Account Number: [id number] Date of Birth/Sex: 1923/06/01 (84 y.o. F) Treating RN: Cornell Barman Primary Care Provider: Eulas Post, MONICA Other Clinician: Referring Provider: Eulas Post, MONICA Treating Provider/Extender: Melburn Hake, HOYT Weeks in Treatment: 0 Verbal / Phone Orders: No Diagnosis Coding Wound Cleansing o Clean wound with Normal Saline. Skin Barriers/Peri-Wound Care o Antifungal powder-Nystatin Primary Wound Dressing o Silver Alginate Secondary Dressing o Drawtex Dressing Change Frequency o Change dressing every other day. Follow-up Appointments o Return Appointment in 1 week. Medications-please add to medication list. o Other: - Nystatin Powder Patient Medications Allergies: penicillin Notifications Medication Indication Start End nystatin 12/17/2017 DOSE topical 100,000 unit/gram powder - powder topical applied to the g-tubs site peri-wound then cover with the dressing as directed. Electronic Signature(s) Signed: 12/17/2017 12:09:20 PM By: Worthy Keeler PA-C Entered By: Worthy Keeler on 12/17/2017 12:09:19 Shannon [name] ([id number]) -------------------------------------------------------------------------------- Problem List Details Patient Name: Shannon [name] Date of Service: 12/17/2017 10:30 AM Medical Record Number: [id number] Patient Account Number: [id number] Date of  Birth/Sex: 1923-12-12 (84 y.o. F) Treating RN: Cornell Barman Primary Care Provider: Gildardo Cranker Other Clinician: Referring Provider: Eulas Post, MONICA Treating Provider/Extender: Melburn Hake, HOYT Weeks in Treatment: 0 Active Problems ICD-10 Evaluated Encounter Code Description Active Date Today Diagnosis B35.4 Tinea corporis 12/17/2017 No Yes E11.628 Type 2 diabetes mellitus with other skin complications 3/81/0175 No Yes Z93.1 Gastrostomy status 12/17/2017 No Yes Z79.01 Long term (current) use of anticoagulants 12/17/2017 No Yes Inactive Problems Resolved

## 2017-12-24 ENCOUNTER — Ambulatory Visit (INDEPENDENT_AMBULATORY_CARE_PROVIDER_SITE_OTHER): Payer: Medicare Other | Admitting: Podiatry

## 2017-12-24 DIAGNOSIS — I739 Peripheral vascular disease, unspecified: Secondary | ICD-10-CM

## 2017-12-24 DIAGNOSIS — L97512 Non-pressure chronic ulcer of other part of right foot with fat layer exposed: Secondary | ICD-10-CM | POA: Diagnosis not present

## 2017-12-24 DIAGNOSIS — I70235 Atherosclerosis of native arteries of right leg with ulceration of other part of foot: Secondary | ICD-10-CM

## 2017-12-24 DIAGNOSIS — E0859 Diabetes mellitus due to underlying condition with other circulatory complications: Secondary | ICD-10-CM

## 2017-12-25 ENCOUNTER — Other Ambulatory Visit (HOSPITAL_COMMUNITY): Payer: Self-pay | Admitting: Interventional Radiology

## 2017-12-25 ENCOUNTER — Encounter: Payer: Medicare Other | Admitting: Physician Assistant

## 2017-12-25 ENCOUNTER — Other Ambulatory Visit: Payer: Self-pay | Admitting: Physician Assistant

## 2017-12-25 ENCOUNTER — Telehealth: Payer: Self-pay | Admitting: *Deleted

## 2017-12-25 DIAGNOSIS — G8929 Other chronic pain: Secondary | ICD-10-CM

## 2017-12-25 DIAGNOSIS — N183 Chronic kidney disease, stage 3 (moderate): Secondary | ICD-10-CM | POA: Diagnosis not present

## 2017-12-25 DIAGNOSIS — I509 Heart failure, unspecified: Secondary | ICD-10-CM | POA: Diagnosis not present

## 2017-12-25 DIAGNOSIS — L309 Dermatitis, unspecified: Secondary | ICD-10-CM | POA: Diagnosis not present

## 2017-12-25 DIAGNOSIS — I130 Hypertensive heart and chronic kidney disease with heart failure and stage 1 through stage 4 chronic kidney disease, or unspecified chronic kidney disease: Secondary | ICD-10-CM | POA: Diagnosis not present

## 2017-12-25 DIAGNOSIS — E1122 Type 2 diabetes mellitus with diabetic chronic kidney disease: Secondary | ICD-10-CM | POA: Diagnosis not present

## 2017-12-25 DIAGNOSIS — M25512 Pain in left shoulder: Principal | ICD-10-CM

## 2017-12-25 DIAGNOSIS — L97529 Non-pressure chronic ulcer of other part of left foot with unspecified severity: Secondary | ICD-10-CM | POA: Diagnosis not present

## 2017-12-25 DIAGNOSIS — E11628 Type 2 diabetes mellitus with other skin complications: Secondary | ICD-10-CM | POA: Diagnosis not present

## 2017-12-25 DIAGNOSIS — R633 Feeding difficulties, unspecified: Secondary | ICD-10-CM

## 2017-12-25 DIAGNOSIS — K9422 Gastrostomy infection: Secondary | ICD-10-CM | POA: Diagnosis not present

## 2017-12-25 NOTE — Progress Notes (Signed)
Subjective:  84 year old female presenting today for follow up evaluation of ulcerations to the right foot. She states she is doing well overall. She reports finishing the antibiotics as directed. There are no modifying factors noted. Patient is here for further evaluation and treatment.   Past Medical History:  Diagnosis Date  . Acute respiratory failure ([place])   . Anemia    previous blood transfusions  . Arthritis    "all over"  . Asthma   . Bradycardia    requiring previous d/c of BB and reduction of amiodarone  . CAD (coronary artery disease)    nonobstructive per notes  . Chronic diastolic CHF (congestive heart failure) ([place])   . CKD (chronic kidney disease), stage III ([place])   . Complication of blood transfusion    "got the wrong blood type at [facility] in ~ 2015; no adverse reaction that we are aware of"/daughter, [name] (01/27/2016)  . COPD (chronic obstructive pulmonary disease) ([place])   . Depression    "light case"  . DVT (deep venous thrombosis) ([place]) 01/2016   a. LLE DVT 01/2016 - switched from Eliquis to Coumadin.  . Gastric stenosis    a. s/p stomach tube  . GERD (gastroesophageal reflux disease)   . History of blood transfusion    "several" (01/27/2016)  . History of stomach ulcers   . Hyperlipidemia   . Hypertension   . Hypothyroidism   . On home oxygen therapy    "2L; 9PM til 9AM" (06/27/2017)  . Paraesophageal hernia   . Perforated gastric ulcer ([place])   . Pneumonia    "a few times" (06/27/2017)  . Seasonal allergies   . SIADH (syndrome of inappropriate ADH production) ([place])    [name] 01/10/2015  . Small bowel obstruction ([place])    "I don't know how many" (01/11/2015)  . Stroke ([place])    "light one"  . Type II diabetes mellitus ([place])    "related to prednisone use  for > 20 yr; once predinose stopped; no more DM RX" (01/27/2016)  . UTI (urinary tract infection) 02/08/2016  . Ventral hernia with bowel obstruction       Objective/Physical Exam General:  The patient is alert and oriented x3 in no acute distress.  Dermatology:  Wound #1 noted to the right hallux measuring 1.0 x 1.0 x 0.1 cm (LxWxD).   Wound #2 noted to the right fourth toe measuring 0.8 x 0.8 x 0.1 cm.    To the noted ulceration(s), there is no eschar. There is a moderate amount of slough, fibrin, and necrotic tissue noted. Granulation tissue and wound base is red. There is a minimal amount of serosanguineous drainage noted.  There does appear to be some exposed bone to both ulceration sites.  There is no malodor. Periwound integrity is intact. Skin is warm, dry and supple bilateral lower extremities.  Vascular: NonPalpable pedal pulses bilaterally. No edema or erythema noted.   Neurological: Epicritic and protective threshold diminished bilaterally.   Musculoskeletal Exam: Range of motion within normal limits to all pedal and ankle joints bilateral. Muscle strength 5/5 in all groups bilateral.   Assessment: #1 ulceration of the right hallux secondary to diabetes mellitus  #2 ulceration of the right fourth toe secondary to diabetes mellitus #3 diabetes mellitus w/ peripheral neuropathy   Plan of Care:  #1 Patient was evaluated. Cultures reviewed.  #2 Medically necessary excisional debridement including subcutaneous tissue was performed using a tissue nipper and a chisel blade. Excisional debridement of all the  necrotic nonviable tissue down to healthy bleeding viable tissue was performed with post-debridement measurements same as pre-. #3 The wound was cleansed and dry sterile dressing applied. #4 Discontinue using Betadine. Recommended Silvadene cream daily with a bandage.  #5 Continue weightbearing in post op shoe.  #6 Return to clinic in 3 weeks.    Edrick Kins, DPM [facility]  Dr. Edrick Kins, [address]                                        [place], [place] [address]                Office [phone]  Fax [phone]

## 2017-12-25 NOTE — Telephone Encounter (Signed)
[place] for Ortho referral for shoulder pain

## 2017-12-25 NOTE — Telephone Encounter (Signed)
[name], daughter called and stated that patient is having severe Left Shoulder Pain. No relief with Oxycodone or Tylenol. Patient is requesting a referral to an Orthopedic but DOES NOT want to go to [facility]. Please Advise.

## 2017-12-26 ENCOUNTER — Encounter (HOSPITAL_COMMUNITY): Payer: Self-pay | Admitting: Interventional Radiology

## 2017-12-26 ENCOUNTER — Ambulatory Visit (HOSPITAL_COMMUNITY)
Admission: RE | Admit: 2017-12-26 | Discharge: 2017-12-26 | Disposition: A | Discharge status: Home / Self Care | Payer: Medicare Other | Source: Ambulatory Visit | Attending: Interventional Radiology | Admitting: Interventional Radiology

## 2017-12-26 DIAGNOSIS — R633 Feeding difficulties, unspecified: Secondary | ICD-10-CM

## 2017-12-26 DIAGNOSIS — Z4659 Encounter for fitting and adjustment of other gastrointestinal appliance and device: Secondary | ICD-10-CM | POA: Diagnosis not present

## 2017-12-26 DIAGNOSIS — K9413 Enterostomy malfunction: Secondary | ICD-10-CM | POA: Diagnosis not present

## 2017-12-26 DIAGNOSIS — K9423 Gastrostomy malfunction: Secondary | ICD-10-CM | POA: Diagnosis not present

## 2017-12-26 HISTORY — PX: IR GJ TUBE CHANGE: IMG1440

## 2017-12-26 MED ORDER — IOPAMIDOL (ISOVUE-300) INJECTION 61%
INTRAVENOUS | Status: AC
  Administered 2017-12-26: 10 mL
  Filled 2017-12-26: qty 50

## 2017-12-26 MED ORDER — IOPAMIDOL (ISOVUE-300) INJECTION 61%
50.0000 mL | Freq: Once | INTRAVENOUS | Status: AC | PRN
  Administered 2017-12-26: 10 mL

## 2017-12-26 NOTE — Telephone Encounter (Signed)
Referral Placed 

## 2017-12-28 ENCOUNTER — Ambulatory Visit (INDEPENDENT_AMBULATORY_CARE_PROVIDER_SITE_OTHER): Payer: Medicare Other | Admitting: Family

## 2017-12-28 ENCOUNTER — Encounter: Payer: Self-pay | Admitting: Family

## 2017-12-28 ENCOUNTER — Ambulatory Visit (HOSPITAL_COMMUNITY)
Admission: RE | Admit: 2017-12-28 | Discharge: 2017-12-28 | Disposition: A | Discharge status: Home / Self Care | Payer: Medicare Other | Source: Ambulatory Visit | Attending: Family | Admitting: Family

## 2017-12-28 ENCOUNTER — Ambulatory Visit (INDEPENDENT_AMBULATORY_CARE_PROVIDER_SITE_OTHER)
Admission: RE | Admit: 2017-12-28 | Discharge: 2017-12-28 | Disposition: A | Discharge status: Home / Self Care | Payer: Medicare Other | Source: Ambulatory Visit | Attending: Family | Admitting: Family

## 2017-12-28 VITALS — BP 175/71 | HR 68 | Temp 97.2°F | Resp 16 | Ht 66.0 in | Wt 150.0 lb

## 2017-12-28 DIAGNOSIS — I70201 Unspecified atherosclerosis of native arteries of extremities, right leg: Secondary | ICD-10-CM | POA: Insufficient documentation

## 2017-12-28 DIAGNOSIS — I739 Peripheral vascular disease, unspecified: Secondary | ICD-10-CM | POA: Diagnosis not present

## 2017-12-28 DIAGNOSIS — I779 Disorder of arteries and arterioles, unspecified: Secondary | ICD-10-CM | POA: Diagnosis not present

## 2017-12-28 DIAGNOSIS — I70262 Atherosclerosis of native arteries of extremities with gangrene, left leg: Secondary | ICD-10-CM

## 2017-12-28 DIAGNOSIS — Z7722 Contact with and (suspected) exposure to environmental tobacco smoke (acute) (chronic): Secondary | ICD-10-CM | POA: Diagnosis not present

## 2017-12-28 DIAGNOSIS — Z9862 Peripheral vascular angioplasty status: Secondary | ICD-10-CM | POA: Diagnosis not present

## 2017-12-28 NOTE — Progress Notes (Signed)
not want information on Advanced Directives; Living Will: Yes (Not Provided) Physician Affirmation I have reviewed and agree with the above information. Electronic Signature(s) Signed: 12/25/2017 5:36:35 PM By: Worthy Keeler PA-C Signed: 12/26/2017 5:04:30 PM By: Montey Hora Entered By: Worthy Keeler on 12/25/2017 16:53:59 [name] ([id number]) -------------------------------------------------------------------------------- SuperBill Details Patient Name: [name] Date  of Service: 12/25/2017 Medical Record Number: [id number] Patient Account Number: [id number] Date of Birth/Sex: [birth year] (84 y.o. F) Treating RN: Montey Hora Primary Care Provider: Eulas Post, MONICA Other Clinician: Referring Provider: Eulas Post, MONICA Treating Provider/Extender: Melburn Hake, HOYT Weeks in Treatment: 1 Diagnosis Coding ICD-10 Codes Code Description B35.4 Tinea corporis E11.628 Type 2 diabetes mellitus with other skin complications I32.5 Gastrostomy status Z79.01 Long term (current) use of anticoagulants Facility Procedures CPT4 Code: 49826415 Description: 83094 - WOUND CARE VISIT-LEV 3 EST PT Modifier: Quantity: 1 Physician Procedures CPT4 Code: 0768088 Description: 11031 - WC PHYS LEVEL 4 - EST PT ICD-10 Diagnosis Description B35.4 Tinea corporis E11.628 Type 2 diabetes mellitus with other skin complications R94.5 Gastrostomy status Z79.01 Long term (current) use of anticoagulants Modifier: Quantity: 1 Electronic Signature(s) Signed: 12/25/2017 5:36:35 PM By: Worthy Keeler PA-C Entered By: Worthy Keeler on 12/25/2017 16:55:05  not want information on Advanced Directives; Living Will: Yes (Not Provided) Physician Affirmation I have reviewed and agree with the above information. Electronic Signature(s) Signed: 12/25/2017 5:36:35 PM By: Worthy Keeler PA-C Signed: 12/26/2017 5:04:30 PM By: Montey Hora Entered By: Worthy Keeler on 12/25/2017 16:53:59 [name] ([id number]) -------------------------------------------------------------------------------- SuperBill Details Patient Name: [name] Date  of Service: 12/25/2017 Medical Record Number: [id number] Patient Account Number: [id number] Date of Birth/Sex: [birth year] (84 y.o. F) Treating RN: Montey Hora Primary Care Provider: Eulas Post, MONICA Other Clinician: Referring Provider: Eulas Post, MONICA Treating Provider/Extender: Melburn Hake, HOYT Weeks in Treatment: 1 Diagnosis Coding ICD-10 Codes Code Description B35.4 Tinea corporis E11.628 Type 2 diabetes mellitus with other skin complications I32.5 Gastrostomy status Z79.01 Long term (current) use of anticoagulants Facility Procedures CPT4 Code: 49826415 Description: 83094 - WOUND CARE VISIT-LEV 3 EST PT Modifier: Quantity: 1 Physician Procedures CPT4 Code: 0768088 Description: 11031 - WC PHYS LEVEL 4 - EST PT ICD-10 Diagnosis Description B35.4 Tinea corporis E11.628 Type 2 diabetes mellitus with other skin complications R94.5 Gastrostomy status Z79.01 Long term (current) use of anticoagulants Modifier: Quantity: 1 Electronic Signature(s) Signed: 12/25/2017 5:36:35 PM By: Worthy Keeler PA-C Entered By: Worthy Keeler on 12/25/2017 16:55:05  ORLI, DEGRAVE ([id number]) Visit Report for 12/25/2017 Chief Complaint Document Details Patient Name: KATALAYA, BEEL Date of Service: 12/25/2017 11:00 AM Medical Record Number: [id number] Patient Account Number: [id number] Date of Birth/Sex: 02-22-1924 (84 y.o. F) Treating RN: Montey Hora Primary Care Provider: Eulas Post, MONICA Other Clinician: Referring Provider: Eulas Post, MONICA Treating Provider/Extender: Melburn Hake, HOYT Weeks in Treatment: 1 Information Obtained from: Patient Chief Complaint Rash around G-tube site Electronic Signature(s) Signed: 12/25/2017 5:36:35 PM By: Worthy Keeler PA-C Entered By: Worthy Keeler on 12/25/2017 11:22:36 [name] ([id number]) -------------------------------------------------------------------------------- HPI Details Patient Name: [name] Date of Service: 12/25/2017 11:00 AM Medical Record Number: [id number] Patient Account Number: [id number] Date of Birth/Sex: [birth year] (84 y.o. F) Treating RN: Montey Hora Primary Care Provider: Eulas Post, MONICA Other Clinician: Referring Provider: Eulas Post, MONICA Treating Provider/Extender: Melburn Hake, HOYT Weeks in Treatment: 1 History of Present Illness HPI Description: 08/08/16; this is a 84 year old women who arrives in our clinic referred from her podiatrist Dr. Amalia Hailey. She is a type 2 diabetic with PAD who recently came to the attention of vein and vascular in [place]. She presented with a black toe on the left foot. She was admitted to hospital with this from 07/28/16-08/01/16. MRI of the foot suggested a tiny subcutaneous abscess at the base of the second proximal phalynx suggested osteomyelitis. She did not wish to consider amputation or angiography. She received vanc and cephapime in the hospital discharged on clindamycin for a month. She is on Coumadin. Stage 3 crf, Previous non invasive arterial studies showed diffuse plaque with circumferential calcified vessels. monophasic waveforms in the  superficial femoral artery and below. Patient is in moderate pain. They are only using dial soaks. and gauze. 08/22/16 Patient returns to clinic with ischemic wounds on her left 2nd,3rd, and first toes. She denies severe pain,fever or other systemic symptoms. Tells me she was in hospital for pneumonia. I will check these records. She states she received different antibiotics from the clindamycin she was on. she has been using silver alginate. [facility]. She again tells me she does not wish to have attempts at revascularization or amputation. She does not feel she wound survive the surgery. 09/05/16; the patient continues to have gradual deterioration in the condition of the left foot. She has mummification of the left second toe wound on the left third toe, necrotic tissue on the plantar aspect of the left second toe. She had previously refused angiography or amputation. She was seen by vein and vascular in [place]. She is using silver alginate. She seems a little more accepting of the thought of surgery today I think she has been talking to her family. She asked me at what level her leg would need to be amputated Readmission: 12/17/17 patient is seen for reevaluation today in the clinic although for a different issue. She was previously seen for toe ulcers although she has been seen by Dr. Amalia Hailey and had amputation and multiple locations were not looking at this at all today. The reason she comes in today is evaluation of her G-tube site where she has a lot of irritation and drainage around the edge of this. She does have a history of diabetes mellitus type II she is on medicine for this and is a hemoglobin A1c of 6.8 noted on 12/10/17. She also is on chronic Coumadin therapy at this point in doing well in that regard according to her family. She's previously used nystatin powder under her breasts when she had some bread/irritated  not want information on Advanced Directives; Living Will: Yes (Not Provided) Physician Affirmation I have reviewed and agree with the above information. Electronic Signature(s) Signed: 12/25/2017 5:36:35 PM By: Worthy Keeler PA-C Signed: 12/26/2017 5:04:30 PM By: Montey Hora Entered By: Worthy Keeler on 12/25/2017 16:53:59 [name] ([id number]) -------------------------------------------------------------------------------- SuperBill Details Patient Name: [name] Date  of Service: 12/25/2017 Medical Record Number: [id number] Patient Account Number: [id number] Date of Birth/Sex: [birth year] (84 y.o. F) Treating RN: Montey Hora Primary Care Provider: Eulas Post, MONICA Other Clinician: Referring Provider: Eulas Post, MONICA Treating Provider/Extender: Melburn Hake, HOYT Weeks in Treatment: 1 Diagnosis Coding ICD-10 Codes Code Description B35.4 Tinea corporis E11.628 Type 2 diabetes mellitus with other skin complications I32.5 Gastrostomy status Z79.01 Long term (current) use of anticoagulants Facility Procedures CPT4 Code: 49826415 Description: 83094 - WOUND CARE VISIT-LEV 3 EST PT Modifier: Quantity: 1 Physician Procedures CPT4 Code: [id number] Description: 11031 - WC PHYS LEVEL 4 - EST PT ICD-10 Diagnosis Description B35.4 Tinea corporis E11.628 Type 2 diabetes mellitus with other skin complications R94.5 Gastrostomy status Z79.01 Long term (current) use of anticoagulants Modifier: Quantity: 1 Electronic Signature(s) Signed: 12/25/2017 5:36:35 PM By: Worthy Keeler PA-C Entered By: Worthy Keeler on 12/25/2017 16:55:05  region but she has not used any around the G-tube  site. She does have stage III renal failure. The area does bother her as far as pain is concerned around the G-tube site. 12/25/17 on evaluation today the patient actually appears to be doing much better in regard to the G-tube site currently. I did look at the picture from last week compared this week and there is significant improvement in the overall erythema in comparison between the two weeks. Fortunately there's no evidence of infection at this time which is good news as far as bacterial infection is concerned. Nonetheless I do think that she may benefit from using a little bit of triamcinolone around the area of erythema before applying the nystatin powder. Fortunately the patient states the pain is significantly improved. Electronic Signature(s) Signed: 12/25/2017 5:36:35 PM By: Worthy Keeler PA-C Entered By: Worthy Keeler on 12/25/2017 16:53:42 [name] ([id number]) -------------------------------------------------------------------------------- Physical Exam Details Patient Name: [name] Date of Service: 12/25/2017 11:00 AM Medical Record Number: [id number] Patient Account Number: [id number] Date of Birth/Sex: 1923-11-23 (84 y.o. F) Treating RN: Montey Hora Primary Care Provider: Eulas Post, MONICA Other Clinician: Referring Provider: Eulas Post, MONICA Treating Provider/Extender: STONE III, HOYT Weeks in Treatment: 1 Constitutional Well-nourished and well-hydrated in no acute distress. Respiratory normal breathing without difficulty. clear to auscultation bilaterally. Cardiovascular regular rate and rhythm with normal S1, S2. Psychiatric this patient is able to make decisions and demonstrates good insight into disease process. Alert and Oriented x 3. pleasant and cooperative. Notes Currently the patient seems to be shown signs again of improvement no debridement was performed obviously there's not really specific wound there's more of a rash surrounding the G-tube site  which we are managing at this time. I'm hopeful that she may be ready for discharge shortly if everything seems to be showing signs of good improvement. Already I see dramatic improvement just from last week to this week. Electronic Signature(s) Signed: 12/25/2017 5:36:35 PM By: Worthy Keeler PA-C Entered By: Worthy Keeler on 12/25/2017 16:54:16 [name] ([id number]) -------------------------------------------------------------------------------- Physician Orders Details Patient Name: [name] Date of Service: 12/25/2017 11:00 AM Medical Record Number: [id number] Patient Account Number: [id number] Date of Birth/Sex: [birth year] (84 y.o. F) Treating RN: Montey Hora Primary Care Provider: Eulas Post, MONICA Other Clinician: Referring Provider: Eulas Post, MONICA Treating Provider/Extender: Melburn Hake, HOYT Weeks in Treatment: 1 Verbal / Phone Orders: No Diagnosis Coding ICD-10 Coding Code Description B35.4 Tinea corporis E11.628 Type 2 diabetes mellitus with other skin complications M22.6 Gastrostomy status Z79.01 Long term (current) use of anticoagulants Wound Cleansing o Clean wound with Normal Saline. o Cleanse wound with mild soap and water Skin Barriers/Peri-Wound Care o Antifungal powder-Nystatin o Triamcinolone Acetonide Ointment (TCA) Primary Wound Dressing o Silver Alginate Secondary Dressing o Drawtex Dressing Change Frequency o Change dressing every other day. Follow-up Appointments o Return Appointment in 1 week. Medications-please add to medication list. o Other: - Nystatin Powder Patient Medications Allergies: penicillin Notifications Medication Indication Start End triamcinolone acetonide 12/25/2017 DOSE topical 0.1 % cream - cream topical applied in a thin film to the g-tube site where the skin is red then sprinkle the nystatin over this Electronic Signature(s) Signed: 12/25/2017 11:40:38 AM By: Worthy Keeler PA-C Entered By: Worthy Keeler on 12/25/2017 11:40:37 [name] ([id number]) -------------------------------------------------------------------------------- Problem List Details Patient Name: [name] Date of Service: 12/25/2017 11:00 AM Medical Record Number: [id number] Patient Account Number: [id number] Date of Birth/Sex: Jan 10, 1924 (84 y.o. F) Treating RN: Montey Hora Primary Care  the plan as is her daughter we will see were things stand at follow-up. Please see above for specific wound care orders. We will see patient for re-evaluation in 1 week(s) here in the clinic. If anything worsens or changes patient will contact our office for additional recommendations. Electronic Signature(s) Signed: 12/25/2017 5:36:35 PM By: Worthy Keeler PA-C Entered By: Worthy Keeler on 12/25/2017 16:54:50 [name] ([id number]) LUCIANA, CAMMARATA ([id number]) -------------------------------------------------------------------------------- ROS/PFSH Details Patient Name: [name] Date of Service: 12/25/2017 11:00 AM Medical Record Number: [id number] Patient Account Number: [id number] Date of Birth/Sex: 1923/08/17 (84 y.o. F) Treating RN: Montey Hora Primary Care Provider: Eulas Post, MONICA Other Clinician: Referring Provider: Eulas Post, MONICA Treating Provider/Extender: Melburn Hake, HOYT Weeks in Treatment: 1 Information Obtained From Patient Wound History Do you currently have one or more open woundso Yes How many open wounds do you currently haveo 1 Approximately how long have you had your woundso 6 weeks How have you been treating your wound(s) until nowo barrier cream Has your wound(s) ever healed and then re-openedo  No Have you had any lab work done in the past montho No Have you tested positive for an antibiotic resistant organism (MRSA, VRE)o No Have you tested positive for osteomyelitis (bone infection)o No Have you had any tests for circulation on your legso No Constitutional Symptoms (General Health) Complaints and Symptoms: Negative for: Fever; Chills Eyes Medical History: Positive for: Cataracts Negative for: Glaucoma; Optic Neuritis Ear/Nose/Mouth/Throat Medical History: Negative for: Chronic sinus problems/congestion; Middle ear problems Hematologic/Lymphatic Medical History: Positive for: Anemia Negative for: Hemophilia; Human Immunodeficiency Virus; Lymphedema; Sickle Cell Disease Respiratory Complaints and Symptoms: No Complaints or Symptoms Medical History: Positive for: Asthma; Chronic Obstructive Pulmonary Disease (COPD) Negative for: Aspiration; Pneumothorax; Sleep Apnea; Tuberculosis Cardiovascular Complaints and Symptoms: No Complaints or Symptoms Medical HistoryDEMARIE, UHLIG ([id number]) Positive for: Arrhythmia; Congestive Heart Failure; Coronary Artery Disease; Deep Vein Thrombosis; Hypertension Negative for: Angina; Hypotension; Myocardial Infarction; Peripheral Arterial Disease; Peripheral Venous Disease; Phlebitis; Vasculitis Gastrointestinal Medical History: Negative for: Cirrhosis ; Colitis; Crohnos; Hepatitis A; Hepatitis B; Hepatitis C Past Medical History Notes: GERD, Small bowel obstruction ,Paraesophageal hernia o o Perforated gastric ulcer (HCC)History of stomach ulcers, Gastric stenosis o o a. s/p stomach tube Endocrine Medical History: Positive for: Type II Diabetes Negative for: Type I Diabetes Time with diabetes: years Treated with: Insulin Blood sugar tested every day: Yes Tested : Blood sugar testing results: Bedtime: 240 Genitourinary Medical History: Negative for: End Stage Renal Disease Immunological Medical History: Negative for:  Lupus Erythematosus; Raynaudos; Scleroderma Integumentary (Skin) Medical History: Negative for: History of Burn; History of pressure wounds Musculoskeletal Medical History: Positive for: Osteoarthritis Negative for: Gout; Rheumatoid Arthritis; Osteomyelitis Neurologic Medical History: Positive for: Neuropathy Negative for: Dementia; Quadriplegia; Paraplegia; Seizure Disorder Oncologic Medical History: Negative for: Received Chemotherapy; Received Radiation Psychiatric NAVYA, TIMMONS ([id number]) Complaints and Symptoms: No Complaints or Symptoms Medical History: Negative for: Anorexia/bulimia; Confinement Anxiety HBO Extended History Items Eyes: Cataracts Immunizations Pneumococcal Vaccine: Received Pneumococcal Vaccination: Yes Implantable Devices Family and Social History Cancer: No; Diabetes: Yes - Siblings,Paternal Grandparents,Maternal Grandparents; Heart Disease: Yes - Siblings,Paternal Grandparents; Hereditary Spherocytosis: No; Hypertension: Yes - Siblings; Kidney Disease: No; Lung Disease: Yes - Siblings,Maternal Grandparents; Seizures: No; Stroke: Yes - Siblings; Thyroid Problems: Yes - Siblings; Tuberculosis: No; Never smoker; Marital Status - Widowed; Alcohol Use: Never; Drug Use: No History; Caffeine Use: Moderate; Financial Concerns: No; Food, Clothing or Shelter Needs: No; Support System Lacking: No; Transportation Concerns: No; Advanced Directives: Yes (Not Provided); Patient does

## 2017-12-28 NOTE — Progress Notes (Signed)
Complex Wound Cleansing - multiple wounds X- 1 5 Wound Imaging (photographs - any number of wounds) []  - 0 Wound Tracing (instead of photographs) X- 1 5 Simple Wound Measurement - one wound []  - 0 Complex Wound Measurement - multiple wounds INTERVENTIONS - Wound Dressings X - Small Wound Dressing one or multiple wounds 1 10 []  - 0 Medium Wound Dressing one or multiple wounds []  - 0 Large Wound Dressing one or multiple wounds []  - 0 Application of Medications - topical []  - 0 Application of Medications - injection INTERVENTIONS - Miscellaneous []  - External ear exam 0 []  - 0 Specimen Collection (cultures, biopsies, blood, body fluids, etc.) []  - 0 Specimen(s) / Culture(s) sent or taken to Lab for analysis []  - 0 Patient Transfer (multiple staff / Civil Service fast streamer / Similar devices) []  - 0 Simple Staple / Suture removal (25 or less) []  - 0 Complex Staple / Suture removal (26 or more) []  - 0 Hypo / Hyperglycemic Management (close monitor of Blood Glucose) []  - 0 Ankle / Brachial Index (ABI) - do not check if billed separately X- 1 5 Vital Signs EBONIE, WESTERLUND ([id number]) Has the patient been seen at the hospital within the last three years: Yes Total Score: 90 Level Of Care: New/Established - Level 3 Electronic Signature(s) Signed: 12/26/2017 5:04:30 PM By: Montey Hora Entered By: Montey Hora on 12/25/2017 11:43:22 Shannon [name] ([id number]) -------------------------------------------------------------------------------- Encounter Discharge Information Details Patient Name: Shannon [name] Date of Service: 12/25/2017 11:00 AM Medical Record Number: [id number] Patient Account Number: [id number] Date of Birth/Sex: 10/30/1923 (84 y.o. F) Treating RN: Cornell Barman Primary Care  Sahmir Weatherbee: Eulas Post, MONICA Other Clinician: Referring Briauna Gilmartin: Eulas Post, MONICA Treating Edyn Popoca/Extender: Melburn Hake, HOYT Weeks in Treatment: 1 Encounter Discharge Information Items Discharge Condition: Stable Ambulatory Status: Walker Discharge Destination: Home Transportation: Private Auto Accompanied By: daughter Schedule Follow-up Appointment: Yes Clinical Summary of Care: Electronic Signature(s) Signed: 12/25/2017 4:44:51 PM By: Gretta Cool, BSN, RN, CWS, Kim RN, BSN Entered By: Gretta Cool, BSN, RN, CWS, Kim on 12/25/2017 11:44:32 Shannon [name] ([id number]) -------------------------------------------------------------------------------- Lower Extremity Assessment Details Patient Name: Shannon [name] Date of Service: 12/25/2017 11:00 AM Medical Record Number: [id number] Patient Account Number: [id number] Date of Birth/Sex: [birth year] (84 y.o. F) Treating RN: Roger Shelter Primary Care Elorah Dewing: Eulas Post, MONICA Other Clinician: Referring Avry Monteleone: Eulas Post, MONICA Treating Klayten Jolliff/Extender: Melburn Hake, HOYT Weeks in Treatment: 1 Electronic Signature(s) Signed: 12/25/2017 2:05:43 PM By: Roger Shelter Entered By: Roger Shelter on 12/25/2017 11:16:49 Shannon [name] ([id number]) -------------------------------------------------------------------------------- Multi Wound Chart Details Patient Name: Shannon [name] Date of Service: 12/25/2017 11:00 AM Medical Record Number: [id number] Patient Account Number: [id number] Date of Birth/Sex: 03-Oct-1923 (84 y.o. F) Treating RN: Montey Hora Primary Care Fayette Gasner: Eulas Post, MONICA Other Clinician: Referring Dearius Hoffmann: Eulas Post, MONICA Treating Ehan Freas/Extender: Melburn Hake, HOYT Weeks in Treatment: 1 Vital Signs Height(in): Pulse(bpm): 64 Weight(lbs): Blood Pressure(mmHg): 134/59 Body Mass Index(BMI): Temperature(F): 97.7 Respiratory Rate 16 (breaths/min): Wound Assessments Treatment Notes Electronic Signature(s) Signed: 12/26/2017 5:04:30 PM By:  Montey Hora Entered By: Montey Hora on 12/25/2017 11:25:23 Shannon [name] ([id number]) -------------------------------------------------------------------------------- Multi-Disciplinary Care Plan Details Patient Name: Shannon [name] Date of Service: 12/25/2017 11:00 AM Medical Record Number: [id number] Patient Account Number: [id number] Date of Birth/Sex: July 10, 1923 (84 y.o. F) Treating RN: Montey Hora Primary Care Haris Baack: Eulas Post, MONICA Other Clinician: Referring Jahnavi Muratore: Eulas Post, MONICA Treating Erron Wengert/Extender: Melburn Hake, HOYT Weeks in Treatment: 1 Active Inactive ` Orientation to the Wound Care Program Nursing Diagnoses: Knowledge deficit related to the wound healing center program Goals: Patient/caregiver will verbalize understanding of the Wound  RAYLEE, STREHL ([id number]) Visit Report for 12/25/2017 Arrival Information Details Patient Name: [name], [name] Date of Service: 12/25/2017 11:00 AM Medical Record Number: [id number] Patient Account Number: [id number] Date of Birth/Sex: [birth year] (84 y.o. F) Treating RN: Montey Hora Primary Care Terrence Pizana: Eulas Post, MONICA Other Clinician: Referring Arali Somera: Eulas Post, MONICA Treating Piper Hassebrock/Extender: Melburn Hake, HOYT Weeks in Treatment: 1 Visit Information History Since Last Visit Added or deleted any medications: No Patient Arrived: Walker Any new allergies or adverse reactions: No Arrival Time: 11:05 Had a fall or experienced change in No Accompanied By: daughter activities of daily living that may affect Transfer Assistance: None risk of falls: Patient Identification Verified: Yes Signs or symptoms of abuse/neglect since last visito No Secondary Verification Process Yes Hospitalized since last visit: No Completed: Implantable device outside of the clinic excluding No Patient Has Alerts: Yes cellular tissue based products placed in the center Patient Alerts: Patient on Blood since last visit: Thinner Has Dressing in Place as Prescribed: Yes DMII Pain Present Now: No warfarin Electronic Signature(s) Signed: 12/25/2017 4:26:07 PM By: Lorine Bears RCP, RRT, CHT Entered By: Lorine Bears on 12/25/2017 11:07:51 Shannon [name] ([id number]) -------------------------------------------------------------------------------- Clinic Level of Care Assessment Details Patient Name: Shannon [name] Date of Service: 12/25/2017 11:00 AM Medical Record Number: [id number] Patient Account Number: [id number] Date of Birth/Sex: [birth year] (84 y.o. F) Treating RN: Montey Hora Primary Care Jahn Franchini: Eulas Post, MONICA Other Clinician: Referring Lalitha Ilyas: Eulas Post, MONICA Treating Avalyn Molino/Extender: Melburn Hake, HOYT Weeks in Treatment: 1 Clinic Level of Care Assessment  Items TOOL 4 Quantity Score []  - Use when only an EandM is performed on FOLLOW-UP visit 0 ASSESSMENTS - Nursing Assessment / Reassessment X - Reassessment of Co-morbidities (includes updates in patient status) 1 10 X- 1 5 Reassessment of Adherence to Treatment Plan ASSESSMENTS - Wound and Skin Assessment / Reassessment []  - Simple Wound Assessment / Reassessment - one wound 0 []  - 0 Complex Wound Assessment / Reassessment - multiple wounds X- 1 10 Dermatologic / Skin Assessment (not related to wound area) ASSESSMENTS - Focused Assessment []  - Circumferential Edema Measurements - multi extremities 0 []  - 0 Nutritional Assessment / Counseling / Intervention []  - 0 Lower Extremity Assessment (monofilament, tuning fork, pulses) []  - 0 Peripheral Arterial Disease Assessment (using hand held doppler) ASSESSMENTS - Ostomy and/or Continence Assessment and Care []  - Incontinence Assessment and Management 0 []  - 0 Ostomy Care Assessment and Management (repouching, etc.) PROCESS - Coordination of Care X - Simple Patient / Family Education for ongoing care 1 15 []  - 0 Complex (extensive) Patient / Family Education for ongoing care X- 1 10 Staff obtains Programmer, systems, Records, Test Results / Process Orders []  - 0 Staff telephones HHA, Nursing Homes / Clarify orders / etc []  - 0 Routine Transfer to another Facility (non-emergent condition) []  - 0 Routine Hospital Admission (non-emergent condition) []  - 0 New Admissions / Biomedical engineer / Ordering NPWT, Apligraf, etc. []  - 0 Emergency Hospital Admission (emergent condition) X- 1 10 Simple Discharge Coordination ISLAY, POLANCO ([id number]) []  - 0 Complex (extensive) Discharge Coordination PROCESS - Special Needs []  - Pediatric / Minor Patient Management 0 []  - 0 Isolation Patient Management []  - 0 Hearing / Language / Visual special needs []  - 0 Assessment of Community assistance (transportation, D/C planning, etc.) []  -  0 Additional assistance / Altered mentation []  - 0 Support Surface(s) Assessment (bed, cushion, seat, etc.) INTERVENTIONS - Wound Cleansing / Measurement X - Simple Wound Cleansing - one wound 1 5 []  - 0  RAYLEE, STREHL ([id number]) Visit Report for 12/25/2017 Arrival Information Details Patient Name: [name], [name] Date of Service: 12/25/2017 11:00 AM Medical Record Number: [id number] Patient Account Number: [id number] Date of Birth/Sex: [birth year] (84 y.o. F) Treating RN: Montey Hora Primary Care Terrence Pizana: Eulas Post, MONICA Other Clinician: Referring Arali Somera: Eulas Post, MONICA Treating Piper Hassebrock/Extender: Melburn Hake, HOYT Weeks in Treatment: 1 Visit Information History Since Last Visit Added or deleted any medications: No Patient Arrived: Walker Any new allergies or adverse reactions: No Arrival Time: 11:05 Had a fall or experienced change in No Accompanied By: daughter activities of daily living that may affect Transfer Assistance: None risk of falls: Patient Identification Verified: Yes Signs or symptoms of abuse/neglect since last visito No Secondary Verification Process Yes Hospitalized since last visit: No Completed: Implantable device outside of the clinic excluding No Patient Has Alerts: Yes cellular tissue based products placed in the center Patient Alerts: Patient on Blood since last visit: Thinner Has Dressing in Place as Prescribed: Yes DMII Pain Present Now: No warfarin Electronic Signature(s) Signed: 12/25/2017 4:26:07 PM By: Lorine Bears RCP, RRT, CHT Entered By: Lorine Bears on 12/25/2017 11:07:51 Shannon [name] ([id number]) -------------------------------------------------------------------------------- Clinic Level of Care Assessment Details Patient Name: Shannon [name] Date of Service: 12/25/2017 11:00 AM Medical Record Number: [id number] Patient Account Number: [id number] Date of Birth/Sex: [birth year] (84 y.o. F) Treating RN: Montey Hora Primary Care Jahn Franchini: Eulas Post, MONICA Other Clinician: Referring Lalitha Ilyas: Eulas Post, MONICA Treating Avalyn Molino/Extender: Melburn Hake, HOYT Weeks in Treatment: 1 Clinic Level of Care Assessment  Items TOOL 4 Quantity Score []  - Use when only an EandM is performed on FOLLOW-UP visit 0 ASSESSMENTS - Nursing Assessment / Reassessment X - Reassessment of Co-morbidities (includes updates in patient status) 1 10 X- 1 5 Reassessment of Adherence to Treatment Plan ASSESSMENTS - Wound and Skin Assessment / Reassessment []  - Simple Wound Assessment / Reassessment - one wound 0 []  - 0 Complex Wound Assessment / Reassessment - multiple wounds X- 1 10 Dermatologic / Skin Assessment (not related to wound area) ASSESSMENTS - Focused Assessment []  - Circumferential Edema Measurements - multi extremities 0 []  - 0 Nutritional Assessment / Counseling / Intervention []  - 0 Lower Extremity Assessment (monofilament, tuning fork, pulses) []  - 0 Peripheral Arterial Disease Assessment (using hand held doppler) ASSESSMENTS - Ostomy and/or Continence Assessment and Care []  - Incontinence Assessment and Management 0 []  - 0 Ostomy Care Assessment and Management (repouching, etc.) PROCESS - Coordination of Care X - Simple Patient / Family Education for ongoing care 1 15 []  - 0 Complex (extensive) Patient / Family Education for ongoing care X- 1 10 Staff obtains Programmer, systems, Records, Test Results / Process Orders []  - 0 Staff telephones HHA, Nursing Homes / Clarify orders / etc []  - 0 Routine Transfer to another Facility (non-emergent condition) []  - 0 Routine Hospital Admission (non-emergent condition) []  - 0 New Admissions / Biomedical engineer / Ordering NPWT, Apligraf, etc. []  - 0 Emergency Hospital Admission (emergent condition) X- 1 10 Simple Discharge Coordination ISLAY, POLANCO ([id number]) []  - 0 Complex (extensive) Discharge Coordination PROCESS - Special Needs []  - Pediatric / Minor Patient Management 0 []  - 0 Isolation Patient Management []  - 0 Hearing / Language / Visual special needs []  - 0 Assessment of Community assistance (transportation, D/C planning, etc.) []  -  0 Additional assistance / Altered mentation []  - 0 Support Surface(s) Assessment (bed, cushion, seat, etc.) INTERVENTIONS - Wound Cleansing / Measurement X - Simple Wound Cleansing - one wound 1 5 []  - 0

## 2017-12-28 NOTE — Patient Instructions (Signed)
Secondhand Smoke What is secondhand smoke? Secondhand smoke is smoke that comes from burning tobacco. It could be the smoke from a cigarette, a pipe, or a cigar. Even if you are not the one smoking, secondhand smoke exposes you to the dangers of smoking. This is called involuntary, or passive, smoking. There are two types of secondhand smoke:  Sidestream smoke is the smoke that comes off the lighted end of a cigarette, pipe, or cigar. ? This type of smoke has the highest amount of cancer-causing agents (carcinogens). ? The particles in sidestream smoke are smaller. They get into your lungs more easily.  Mainstream smoke is the smoke that is exhaled by a person who is smoking. ? This type of smoke is also dangerous to your health.  How can secondhand smoke affect my health? Studies show that there is no safe level of secondhand smoke. This smoke contains thousands of chemicals. At least 64 of them are known to cause cancer. Secondhand smoke can also cause many other health problems. It has been linked to:  Lung cancer.  Cancer of the voice box (larynx) or throat.  Cancer of the sinuses.  Brain cancer.  Bladder cancer.  Stomach cancer.  Breast cancer.  White blood cell cancers (lymphoma and leukemia).  Brain and liver tumors in children.  Heart disease and stroke in adults.  Pregnancy loss (miscarriage).  Diseases in children, such as: ? Asthma. ? Lung infections. ? Ear infections. ? Sudden infant death syndrome (SIDS). ? Slow growth.  Where can I be at risk for exposure to secondhand smoke?  For adults, the workplace is the main source of exposure to secondhand smoke. ? Your workplace should have a policy separating smoking areas from nonsmoking areas. ? Smoking areas should have a system for ventilating and cleaning the air.  For children, the home may be the most dangerous place for exposure to secondhand smoke. ? Children who live in apartment buildings may be at  risk from smoke drifting from hallways or other people's homes.  For everyone, many public places are possible sources of exposure to secondhand smoke. ? These places include restaurants, shopping centers, and parks. How can I reduce my risk for exposure to secondhand smoke? The most important thing you can do is not smoke. Discourage family members from smoking. Other ways to reduce exposure for you and your family include the following:  Keep your home smoke free.  Make sure your child care providers do not smoke.  Warn your child about the dangers of smoking and secondhand smoke.  Do not allow smoking in your car. When someone smokes in a car, all the damaging chemicals from the smoke are confined in a small area.  Avoid public places where smoking is allowed.  This information is not intended to replace advice given to you by your health care provider. Make sure you discuss any questions you have with your health care provider. Document Released: 04/27/2004 Document Revised: 02/15/2016 Document Reviewed: 07/04/2013 Elsevier Interactive Patient Education  2017 [employer].

## 2017-12-28 NOTE — Progress Notes (Signed)
[facility] [place]   CC: Follow up peripheral artery occlusive disease  History of Present Illness Shannon [name] is a 84 y.o. female who is s/p RLE angiogram,  drug coated balloon angioplasty right SFA and popliteal arteries with 8mm balloon, 40mm plain balloon angioplasty of tibioperoneal trunk on right and closure of left common femoral artery for continuous access with minx device on 06/27/17 by Dr. Donzetta Matters.    She subsequently had debridement of her toes by Dr. Amalia Hailey.  She is unsatisfied with the appearance of her toes but overall she is getting around well.    Pt was last evaluated on 07-27-17 by S. Rhyne PA-C and Dr. Donzetta Matters. At that time there was a strong peroneal artery signal on the right and a PT signal at the level of the foot.  Toes appeared per Dr. Amalia Hailey recent note that evidence of infection there is evidence of superficial bleeding. She was to continue to follow with Dr. Amalia Hailey.  She was to follow-up with PA or nurse practitioner in 4 to 5 months with repeat ABIs and duplex.   Dr. Amalia Hailey is managing the ulcer on the medial aspect of her right great toe and at the tip of the right 4th toe; and daughter states these have made some progress.   She has a J-tube, is being seen by the wound care center for an issue at the J-tube insertion site.  She also has a G-tube that is not being used.  Daughter states that pt pylorus is partly stenosed, is able to take a few po liquids.    Diabetic: Yes, last A1C was 6.8 on 12-04-17 Tobacco use: non-smoker; however, her son in law smokes in the house she lives in  Pt meds include: Statin :Yes Betablocker: Yes ASA: No Other anticoagulants/antiplatelets: coumadin for LLE DVT in 01/2016  Past Medical History:  Diagnosis Date  . Acute respiratory failure ([place])   . Anemia    previous blood transfusions  . Arthritis    "all over"  . Asthma   . Bradycardia    requiring previous d/c of BB and reduction of amiodarone  . CAD  (coronary artery disease)    nonobstructive per notes  . Chronic diastolic CHF (congestive heart failure) ([place])   . CKD (chronic kidney disease), stage III ([place])   . Complication of blood transfusion    "got the wrong blood type at [facility] in ~ 2015; no adverse reaction that we are aware of"/daughter, [name] (01/27/2016)  . COPD (chronic obstructive pulmonary disease) ([employer])   . Depression    "light case"  . DVT (deep venous thrombosis) ([place]) 01/2016   a. LLE DVT 01/2016 - switched from Eliquis to Coumadin.  . Gastric stenosis    a. s/p stomach tube  . GERD (gastroesophageal reflux disease)   . History of blood transfusion    "several" (01/27/2016)  . History of stomach ulcers   . Hyperlipidemia   . Hypertension   . Hypothyroidism   . On home oxygen therapy    "2L; 9PM til 9AM" (06/27/2017)  . Paraesophageal hernia   . Perforated gastric ulcer ([place])   . Pneumonia    "a few times" (06/27/2017)  . Seasonal allergies   . SIADH (syndrome of inappropriate ADH production) ([place])    [name] 01/10/2015  . Small bowel obstruction ([place])    "I don't know how many" (01/11/2015)  . Stroke ([place])    "light one"  . Type II diabetes mellitus ([place])    "  related to prednisone use  for > 20 yr; once predinose stopped; no more DM RX" (01/27/2016)  . UTI (urinary tract infection) 02/08/2016  . Ventral hernia with bowel obstruction     Social History Social History   Tobacco Use  . Smoking status: Never Smoker  . Smokeless tobacco: Former Systems developer    Types: Snuff  . Tobacco comment: "used snuff in my younger days"  Substance Use Topics  . Alcohol use: Never    Alcohol/week: 0.0 standard drinks    Frequency: Never  . Drug use: Never    Family History Family History  Problem Relation Age of Onset  . Stroke Mother   . Hypertension Mother   . Diabetes Brother   . Glaucoma Son   . Glaucoma Son   . Heart attack Neg Hx   . Colon cancer Neg Hx   . Stomach cancer Neg Hx     Past Surgical  History:  Procedure Laterality Date  . ABDOMINAL AORTOGRAM N/A 09/21/2016   Procedure: Abdominal Aortogram;  Surgeon: Waynetta Sandy, MD;  Location: [facility];  Service: Cardiovascular;  Laterality: N/A;  . AMPUTATION Left 09/25/2016   Procedure: AMPUTATION DIGIT- LEFT 2ND AND 3RD TOES;  Surgeon: Rosetta Posner, MD;  Location: [facility];  Service: Vascular;  Laterality: Left;  . CATARACT EXTRACTION W/ INTRAOCULAR LENS  IMPLANT, BILATERAL    . CHOLECYSTECTOMY OPEN    . COLECTOMY    . ESOPHAGOGASTRODUODENOSCOPY N/A 01/19/2014   Procedure: ESOPHAGOGASTRODUODENOSCOPY (EGD);  Surgeon: Irene Shipper, MD;  Location: [facility];  Service: Endoscopy;  Laterality: N/A;  . ESOPHAGOGASTRODUODENOSCOPY N/A 01/20/2014   Procedure: ESOPHAGOGASTRODUODENOSCOPY (EGD);  Surgeon: Irene Shipper, MD;  Location: [facility];  Service: Endoscopy;  Laterality: N/A;  . ESOPHAGOGASTRODUODENOSCOPY N/A 03/19/2014   Procedure: ESOPHAGOGASTRODUODENOSCOPY (EGD);  Surgeon: Milus Banister, MD;  Location: [facility];  Service: Endoscopy;  Laterality: N/A;  . ESOPHAGOGASTRODUODENOSCOPY N/A 07/08/2015   Procedure: ESOPHAGOGASTRODUODENOSCOPY (EGD);  Surgeon: Doran Stabler, MD;  Location[facility];  Service: Endoscopy;  Laterality: N/A;  . ESOPHAGOGASTRODUODENOSCOPY (EGD) WITH PROPOFOL N/A 09/15/2015   Procedure: ESOPHAGOGASTRODUODENOSCOPY (EGD) WITH PROPOFOL;  Surgeon: Manus Gunning, MD;  Location: [facility];  Service: Gastroenterology;  Laterality: N/A;  . GASTROJEJUNOSTOMY     hx/notes 01/10/2015  . GASTROJEJUNOSTOMY N/A 09/23/2015   Procedure: OPEN GASTROJEJUNOSTOMY TUBE PLACEMENT;  Surgeon: Arta Bruce Kinsinger, MD;  Location: [facility];  Service: General;  Laterality: N/A;  . GLAUCOMA SURGERY Bilateral   . HERNIA REPAIR  2015  . IR CM INJ ANY COLONIC TUBE W/FLUORO  01/05/2017  . IR CM INJ ANY COLONIC TUBE W/FLUORO  06/07/2017  . IR CM INJ ANY COLONIC TUBE W/FLUORO  11/05/2017  . IR GENERIC  HISTORICAL  01/07/2016   IR GJ TUBE CHANGE 01/07/2016 Jacqulynn Cadet, MD [facility]  . IR GENERIC HISTORICAL  01/27/2016   IR MECH REMOV OBSTRUC MAT ANY COLON TUBE W/FLUORO 01/27/2016 Markus Daft, MD [facility]  . IR GENERIC HISTORICAL  02/07/2016   IR PATIENT EVAL TECH 0-60 MINS Darrell K Allred, PA-C [facility]  . IR GENERIC HISTORICAL  02/08/2016   IR GJ TUBE CHANGE 02/08/2016 Greggory Keen, MD [facility]  . IR GENERIC HISTORICAL  01/06/2016   IR GJ TUBE CHANGE 01/06/2016 [facility] OUT REF  . IR GENERIC HISTORICAL  05/02/2016   IR CM INJ ANY COLONIC TUBE W/FLUORO 05/02/2016 Arne Cleveland, MD [facility]  . IR GENERIC HISTORICAL  05/15/2016   IR  [place] TUBE CHANGE 05/15/2016 Sandi Mariscal, MD [facility]  . IR GENERIC HISTORICAL  06/28/2016   IR GJ TUBE CHANGE 06/28/2016 [facility]  . IR GJ TUBE CHANGE  02/20/2017  . IR GJ TUBE CHANGE  05/10/2017  . IR GJ TUBE CHANGE  07/06/2017  . IR GJ TUBE CHANGE  08/02/2017  . IR GJ TUBE CHANGE  10/15/2017  . IR GJ TUBE CHANGE  12/26/2017  . IR PATIENT EVAL TECH 0-60 MINS  10/19/2016  . IR PATIENT EVAL TECH 0-60 MINS  12/25/2016  . IR PATIENT EVAL TECH 0-60 MINS  01/29/2017  . IR PATIENT EVAL TECH 0-60 MINS  04/04/2017  . IR PATIENT EVAL TECH 0-60 MINS  04/30/2017  . IR PATIENT EVAL TECH 0-60 MINS  07/31/2017  . IR REPLC DUODEN/JEJUNO TUBE PERCUT W/FLUORO  11/14/2016  . LAPAROTOMY N/A 01/20/2015   Procedure: EXPLORATORY LAPAROTOMY;  Surgeon: Coralie Keens, MD;  Location: [facility];  Service: General;  Laterality: N/A;  . LOWER EXTREMITY ANGIOGRAPHY Left 09/21/2016   Procedure: Lower Extremity Angiography;  Surgeon: Waynetta Sandy, MD;  Location: [facility];  Service: Cardiovascular;  Laterality: Left;  . LOWER EXTREMITY ANGIOGRAPHY Right 06/27/2017   Procedure: Lower Extremity Angiography;  Surgeon: Waynetta Sandy, MD;  Location: [facility];  Service: Cardiovascular;  Laterality: Right;  . LYSIS OF ADHESION N/A 01/20/2015    Procedure: LYSIS OF ADHESIONS < 1 HOUR;  Surgeon: Coralie Keens, MD;  Location: [facility];  Service: General;  Laterality: N/A;  . PERIPHERAL VASCULAR BALLOON ANGIOPLASTY Left 09/21/2016   Procedure: Peripheral Vascular Balloon Angioplasty;  Surgeon: Waynetta Sandy, MD;  Location: [facility];  Service: Cardiovascular;  Laterality: Left;  drug coated balloon  . PERIPHERAL VASCULAR BALLOON ANGIOPLASTY Right 06/27/2017   Procedure: PERIPHERAL VASCULAR BALLOON ANGIOPLASTY;  Surgeon: Waynetta Sandy, MD;  Location: [facility];  Service: Cardiovascular;  Laterality: Right;  SFA/TPTRUNK  . TONSILLECTOMY    . TUBAL LIGATION    . VENTRAL HERNIA REPAIR  2015   incarcerated ventral hernia ([facility] 09/2013)/notes 01/10/2015    Allergies  Allergen Reactions  . Penicillins Itching, Rash and Other (See Comments)    Tolerated unasyn, zosyn & cephalosporins in the past.  Has patient had a PCN reaction causing immediate rash, facial/tongue/throat swelling, SOB or lightheadedness with hypotension: Yes Has patient had a PCN reaction causing severe rash involving mucus membranes or skin necrosis: Unk Has patient had a PCN reaction that required hospitalization: Unk Has patient had a PCN reaction occurring within the last 10 years: Unk If all of the above answers are "NO", then may proceed with Cephalosporins  . Other     Patient tolerates amoxicillin and cephalosporins    Current Outpatient Medications  Medication Sig Dispense Refill  . acetaminophen (TYLENOL) 500 MG tablet Take 1,000 mg by mouth every 6 (six) hours as needed (pain).    . AMINO ACIDS-PROTEIN HYDROLYS PO Take 30 mLs by mouth 2 (two) times daily.    [name] atorvastatin (LIPITOR) 10 MG tablet TAKE 1 TABLET BY MOUTH EVERY DAY 90 tablet 1  . b complex vitamins tablet Take 1 tablet by mouth daily.     . calcium-vitamin D (OSCAL WITH D) 500-200 MG-UNIT per tablet Take 1 tablet by mouth daily with breakfast.     . carvedilol  (COREG) 6.25 MG tablet Take 6.25 mg by mouth 2 (two) times daily.    . collagenase (SANTYL) ointment Apply 1 application topically  daily. 30 g 1  . cycloSPORINE (RESTASIS) 0.05 % ophthalmic emulsion USE 1 DROP INTO BOTH EYES TWICE DAILY 60 mL 3  . DULERA 200-5 MCG/ACT AERO TAKE 2 PUFFS BY MOUTH TWICE A DAY 13 Inhaler 3  . feeding supplement (OSMOLITE 1 CAL) LIQD Take 711 mLs by mouth at bedtime.     . ferrous sulfate 325 (65 FE) MG tablet TAKE ONE TABLET BY MOUTH ONCE DAILY WITH BREAKFAST 90 tablet 1  . fluticasone (FLONASE) 50 MCG/ACT nasal spray SPRAY 2 SPRAYS INTO EACH NOSTRIL EVERY DAY 16 g 3  . furosemide (LASIX) 40 MG tablet Take 40 mg by mouth daily.    [name] gabapentin (NEURONTIN) 300 MG capsule TAKE 2 CAPSULES (600 MG TOTAL) BY MOUTH 3 (THREE) TIMES DAILY. 540 capsule 2  . gentamicin cream (GARAMYCIN) 0.1 % Apply 1 application topically 3 (three) times daily. 30 g 1  . hydrALAZINE (APRESOLINE) 50 MG tablet Take 50 mg by mouth 3 (three) times daily.    . insulin aspart (NOVOLOG FLEXPEN) 100 UNIT/ML FlexPen Take three times daily before each meal based on sliding scale 140-199( 2 units), 200-250 (4 units), 251-299 (6 units), 300-349 (8 units), > 350(10 units) 15 pen 11  . Insulin Glargine (LANTUS) 100 UNIT/ML Solostar Pen Injection 0.1 ml (10 units) once daily to control blood sugar. Dx E11.9 15 mL 5  . Insulin Pen Needle (B-D UF III MINI PEN NEEDLES) 31G X 5 MM MISC Use twice daily with giving insulin injections. Dx E11.9 300 each 3  . ipratropium-albuterol (DUONEB) 0.5-2.5 (3) MG/3ML SOLN Take 3 mLs by nebulization every 6 (six) hours as needed (sob). Use 3 times daily x 4 days then every 6 hours as needed. 360 mL 4  . isosorbide mononitrate (IMDUR) 30 MG 24 hr tablet Take 2 tablets (60 mg total) by mouth daily. 180 tablet 2  . latanoprost (XALATAN) 0.005 % ophthalmic solution Place 1 drop into both eyes at bedtime. 2.5 mL 12  . levalbuterol (XOPENEX HFA) 45 MCG/ACT inhaler Inhale 2 puffs into  the lungs every 6 (six) hours as needed for wheezing or shortness of breath. 1 Inhaler 12  . levothyroxine (SYNTHROID, LEVOTHROID) 137 MCG tablet TAKE 1 TABLET (137 MCG TOTAL) BY MOUTH DAILY BEFORE BREAKFAST. 30 tablet 3  . Multiple Vitamin (MULTIVITAMIN WITH MINERALS) TABS tablet Take 1 tablet by mouth daily.    [name] nystatin (MYCOSTATIN/NYSTOP) powder Apply topically 3 (three) times daily. 15 g 0  . oxyCODONE (ROXICODONE) 5 MG immediate release tablet Take 1 tablet (5 mg total) by mouth every 4 (four) hours as needed for severe pain. May take 1/2 tab q4hrs as needed for mild pain. 30 tablet 0  . OXYGEN Inhale 2 L into the lungs at bedtime.    . pantoprazole (PROTONIX) 40 MG tablet Take 1 tablet (40 mg total) by mouth 2 (two) times daily. 60 tablet 3  . Polyethyl Glycol-Propyl Glycol 0.4-0.3 % SOLN Place 1 drop into both eyes 4 (four) times daily.     . sertraline (ZOLOFT) 100 MG tablet TAKE ONE TABLET BY MOUTH ONCE DAILY FOR DEPRESSION 90 tablet 1  . warfarin (COUMADIN) 2.5 MG tablet TAKE AS DIRECTED BY COUMADIN CLINIC. 60 tablet 3  . Water For Irrigation, Sterile (FREE WATER) SOLN Place 60 mLs into feeding tube 3 (three) times daily.     Dema Severin Petrolatum-Mineral Oil (SYSTANE NIGHTTIME) OINT Apply 1 application to eye at bedtime. Both eyes     No current facility-administered medications for  this visit.     ROS: See HPI for pertinent positives and negatives.   Physical Examination  Vitals:   12/28/17 1216 12/28/17 1220  BP: (!) 169/68 (!) 175/71  Pulse: 68   Resp: 16   Temp: (!) 97.2 F (36.2 C)   TempSrc: Oral   SpO2: 98%   Weight: 150 lb (68 kg)   Height: 5\' 6"  (1.676 m)    Body mass index is 24.21 kg/m.  General: A&O x 3, WDWN, elderly female. Gait: slow, steady, using rolling walker HENT: No gross abnormalities.  Eyes: PERRLA. Pulmonary: Respirations are non labored, CTAB, good air movement in all fields Cardiac: regular rhythm, no detected murmur.         Carotid  Bruits Right Left   Negative Negative   Radial pulses are 1+ palpable bilaterally   Adominal aortic pulse is not palpable                         VASCULAR EXAM: Extremities without ischemic changes, without Gangrene; with open wounds at right great toe and right 4th toe. Left 2nd and 3rd toes are surgically absent                                                                                                          LE Pulses Right Left       FEMORAL  2+ palpable  2+ palpable        POPLITEAL  not palpable   not palpable       POSTERIOR TIBIAL  not palpable   not palpable        DORSALIS PEDIS      ANTERIOR TIBIAL not palpable  not palpable    Abdomen: soft, NT, J-tube at LUQ, moderate sized hernia to right of umbilicus.  Skin: no rashes, no cellulitis, see Extremities.  Musculoskeletal:age appropriate muscle wasting or atrophy.  Neurologic: A&O X 3; appropriate affect, Sensation to light touch is absent in feet; MOTOR FUNCTION:  moving all extremities equally, motor strength 4/5 throughout. Speech is edentulous. CN 2-12 intact. Psychiatric: Thought content is normal, mood appropriate for clinical situation.     ASSESSMENT: Shannon [name] is a 84 y.o. female who is s/p RLE angiogram,  drug coated balloon angioplasty right SFA and popliteal arteries with 35mm balloon, 80mm plain balloon angioplasty of tibioperoneal trunk on right and closure of left common femoral artery for continuous access with minx device on 06/27/17 by Dr. Donzetta Matters.    She subsequently had debridement of her toes by Dr. Amalia Hailey. Pt wears a post operative shoe on her right foot.   Daughter thinks the right toe ulcers are improving.   She takes coumadin for a history of  LLE DVT in 01/2016.   She walks with a rolling walker, does not seem to walk enough to elicit claudication symptoms.     DATA  Right LE Arterial Duplex (12-28-17): Right: 30-49% stenosis noted in the superficial femoral artery. Diffuse calcified  plaque observed throughout.  All monophasic waveforms.  ABI (Date: 12/28/2017):  R:   ABI: 0.94 (was o.93 on 07-27-17),   PT: mono  DP: mono  TBI:  Ulcer on great toe  L:   ABI: 0.81 (was 0.89),   PT: mono  DP: mono  TBI: 1.21, toe pressure 237 (was 1.34) Bilateral ABI are unreliable, do not correspond to waveforms. Left TBI is unreliable.      PLAN:  Based on the patient's vascular studies and examination, pt will return to clinic in 6 months with ABI's. I advised pt and her daughter to notify us if they develop concerns re worsening circulation in her feet or legs.   Daily seated leg exercises discussed with pt and daughter and demonstrated.   I discussed in depth with the patient the nature of atherosclerosis, and emphasized the importance of maximal medical management including strict control of blood pressure, blood glucose, and lipid levels, obtaining regular exercise, and cessation of exposure to secondhand smoke  The patient is aware that without maximal medical management the underlying atherosclerotic disease process will progress, limiting the benefit of any interventions.  The patient was given information about PAD including signs, symptoms, treatment, what symptoms should prompt the patient to seek immediate medical care, and risk reduction measures to take.  Clemon Chambers, RN, MSN, FNP-C [facility] Phone: [phone]  Clinic MD: Donzetta Matters  12/28/17 12:58 PM

## 2018-01-01 ENCOUNTER — Encounter: Payer: Medicare Other | Attending: Physician Assistant | Admitting: Physician Assistant

## 2018-01-01 ENCOUNTER — Other Ambulatory Visit: Payer: Self-pay | Admitting: Internal Medicine

## 2018-01-01 DIAGNOSIS — Z86718 Personal history of other venous thrombosis and embolism: Secondary | ICD-10-CM | POA: Insufficient documentation

## 2018-01-01 DIAGNOSIS — E11628 Type 2 diabetes mellitus with other skin complications: Secondary | ICD-10-CM | POA: Insufficient documentation

## 2018-01-01 DIAGNOSIS — E1151 Type 2 diabetes mellitus with diabetic peripheral angiopathy without gangrene: Secondary | ICD-10-CM | POA: Diagnosis not present

## 2018-01-01 DIAGNOSIS — Z794 Long term (current) use of insulin: Secondary | ICD-10-CM | POA: Diagnosis not present

## 2018-01-01 DIAGNOSIS — E1140 Type 2 diabetes mellitus with diabetic neuropathy, unspecified: Secondary | ICD-10-CM | POA: Diagnosis not present

## 2018-01-01 DIAGNOSIS — J449 Chronic obstructive pulmonary disease, unspecified: Secondary | ICD-10-CM | POA: Diagnosis not present

## 2018-01-01 DIAGNOSIS — I2510 Atherosclerotic heart disease of native coronary artery without angina pectoris: Secondary | ICD-10-CM | POA: Insufficient documentation

## 2018-01-01 DIAGNOSIS — L309 Dermatitis, unspecified: Secondary | ICD-10-CM | POA: Diagnosis not present

## 2018-01-01 DIAGNOSIS — L539 Erythematous condition, unspecified: Secondary | ICD-10-CM | POA: Diagnosis not present

## 2018-01-01 DIAGNOSIS — Z931 Gastrostomy status: Secondary | ICD-10-CM | POA: Insufficient documentation

## 2018-01-01 DIAGNOSIS — E1122 Type 2 diabetes mellitus with diabetic chronic kidney disease: Secondary | ICD-10-CM | POA: Diagnosis not present

## 2018-01-01 DIAGNOSIS — B354 Tinea corporis: Secondary | ICD-10-CM | POA: Insufficient documentation

## 2018-01-01 DIAGNOSIS — K922 Gastrointestinal hemorrhage, unspecified: Secondary | ICD-10-CM

## 2018-01-01 DIAGNOSIS — K9422 Gastrostomy infection: Secondary | ICD-10-CM | POA: Diagnosis not present

## 2018-01-01 DIAGNOSIS — N183 Chronic kidney disease, stage 3 (moderate): Secondary | ICD-10-CM | POA: Insufficient documentation

## 2018-01-01 DIAGNOSIS — Z7901 Long term (current) use of anticoagulants: Secondary | ICD-10-CM | POA: Diagnosis not present

## 2018-01-02 IMAGING — CR DG CHEST 1V PORT
1 series · 1 of 1 positions shown · non-contrast
Comparison: 07/08/2015

CLINICAL DATA: Severe bradycardia.  Generalized weakness.

EXAM:
PORTABLE CHEST 1 VIEW

[AP]
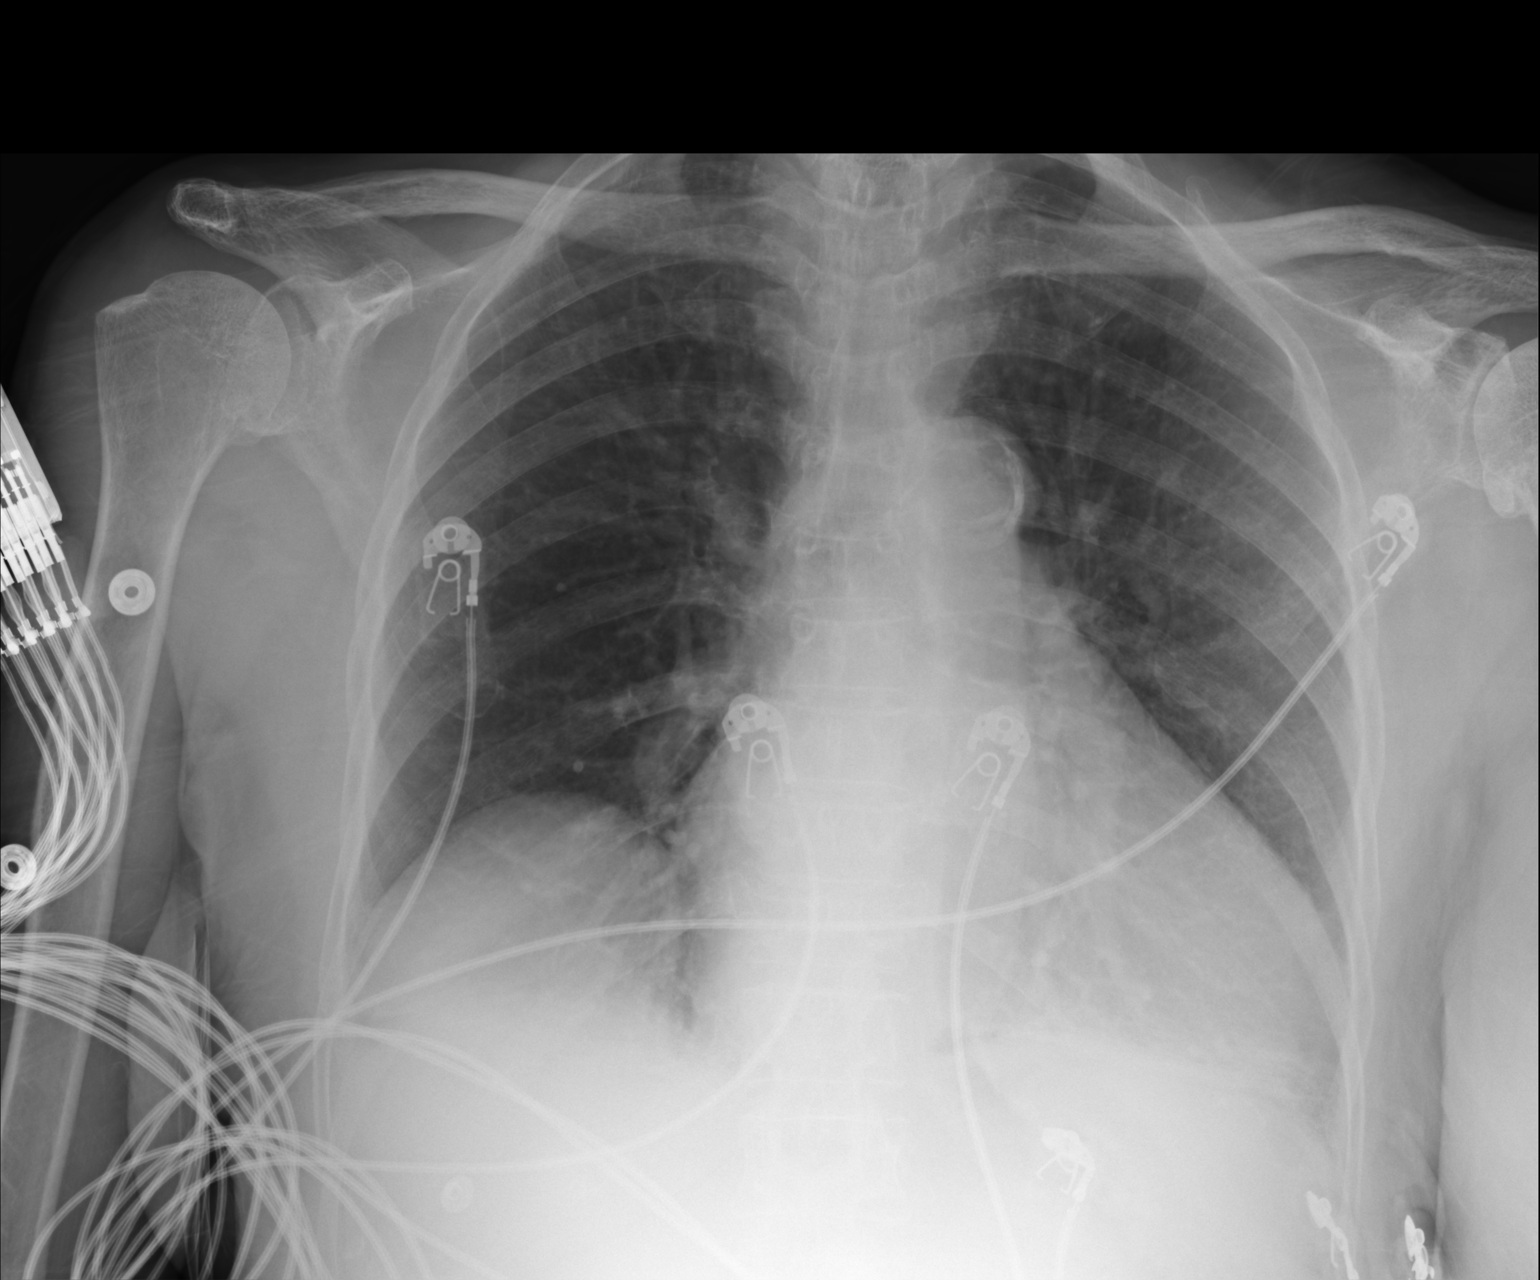

[1 of 1 positions shown; findings below may reference images not displayed]

FINDINGS: Cardiomegaly with calcification in the thoracic aorta. Pulmonary
vascularity is normal. Slight atelectasis at the left lung base.
Right lung is clear. No acute bone abnormality.
IMPRESSION: New cardiomegaly. Aortic atherosclerosis. Slight atelectasis at the
left lung base.

## 2018-01-05 NOTE — Progress Notes (Signed)
Wound Cleansing - multiple wounds []  - 0 Wound Imaging (photographs - any number of wounds) []  - 0 Wound Tracing (instead of photographs) []  - 0 Simple Wound Measurement - one wound []  - 0 Complex Wound Measurement - multiple wounds INTERVENTIONS - Wound Dressings X - Small Wound Dressing one or multiple wounds 1 10 []  - 0 Medium Wound Dressing one or multiple wounds []  - 0 Large Wound Dressing one or multiple wounds []  - 0 Application of Medications - topical []  - 0 Application of Medications - injection INTERVENTIONS - Miscellaneous []  - External ear exam 0 []  - 0 Specimen Collection (cultures, biopsies, blood, body fluids, etc.) []  - 0 Specimen(s) / Culture(s) sent or taken to Lab for analysis []  - 0 Patient Transfer (multiple staff / [employer] fast streamer / Similar devices) []  - 0 Simple Staple / Suture removal (25 or less) []  - 0 Complex Staple / Suture removal (26 or more) []  - 0 Hypo / Hyperglycemic Management (close monitor of Blood Glucose) []  - 0 Ankle / Brachial Index (ABI) - do not check if billed separately X- 1 5 Vital Signs QUINCY, PRISCO ([id number]) Has the patient been seen at the hospital within the last three years: Yes Total Score: 65 Level Of Care: New/Established - Level 2 Electronic Signature(s) Signed: 01/01/2018 5:00:45 PM By: Montey Hora Entered By: Montey Hora on 01/01/2018 11:47:40 Shannon [name] ([id number]) -------------------------------------------------------------------------------- Encounter Discharge Information Details Patient Name: Shannon [name] Date of Service: 01/01/2018 11:00 AM Medical Record Number: [id number] Patient Account Number: [id number] Date of Birth/Sex: [birth year] (84 y.o. F) Treating RN: Montey Hora Primary  Care Crescencio Jozwiak: Eulas Post, MONICA Other Clinician: Referring Lucciano Vitali: Eulas Post, MONICA Treating Kyriaki Moder/Extender: Melburn Hake, HOYT Weeks in Treatment: 2 Encounter Discharge Information Items Discharge Condition: Stable Ambulatory Status: Walker Discharge Destination: Home Transportation: Private Auto Accompanied By: daughter Schedule Follow-up Appointment: Yes Clinical Summary of Care: Electronic Signature(s) Signed: 01/01/2018 5:00:45 PM By: Montey Hora Entered By: Montey Hora on 01/01/2018 11:48:49 Shannon [name] ([id number]) -------------------------------------------------------------------------------- Lower Extremity Assessment Details Patient Name: Shannon [name] Date of Service: 01/01/2018 11:00 AM Medical Record Number: [id number] Patient Account Number: [id number] Date of Birth/Sex: 1923/05/14 (84 y.o. F) Treating RN: Cornell Barman Primary Care Anayelli Lai: Gildardo Cranker Other Clinician: Referring Saanvika Vazques: Eulas Post, MONICA Treating Arlisa Leclere/Extender: Sharalyn Ink in Treatment: 2 Electronic Signature(s) Signed: 01/01/2018 4:33:57 PM By: Gretta Cool, BSN, RN, CWS, Kim RN, BSN Entered By: Gretta Cool, BSN, RN, CWS, Kim on 01/01/2018 11:16:02 Shannon [name] ([id number]) -------------------------------------------------------------------------------- Multi-Disciplinary Care Plan Details Patient Name: Shannon [name] Date of Service: 01/01/2018 11:00 AM Medical Record Number: [id number] Patient Account Number: [id number] Date of Birth/Sex: 02-18-1924 (84 y.o. F) Treating RN: Montey Hora Primary Care Noelle Sease: Gildardo Cranker Other Clinician: Referring Demitra Danley: Eulas Post, MONICA Treating Kilah Drahos/Extender: Melburn Hake, HOYT Weeks in Treatment: 2 Active Inactive Electronic Signature(s) Signed: 01/01/2018 5:00:45 PM By: Montey Hora Entered By: Montey Hora on 01/01/2018 11:33:03 Shannon [name] ([id number]) -------------------------------------------------------------------------------- Non-Wound  Condition Assessment Details Patient Name: Shannon [name] Date of Service: 01/01/2018 11:00 AM Medical Record Number: [id number] Patient Account Number: [id number] Date of Birth/Sex: Nov 27, 1923 (84 y.o. F) Treating RN: Cornell Barman Primary Care Hitesh Fouche: Eulas Post, MONICA Other Clinician: Referring Valarie Farace: Eulas Post, MONICA Treating Aryani Daffern/Extender: Melburn Hake, HOYT Weeks in Treatment: 2 Non-Wound Condition: Condition: Rash / Dermatitis Location: Abdomen Side: Left Periwound Skin Texture Texture Color No Abnormalities Noted: No No Abnormalities Noted: No Callus: No Atrophie Blanche: No Crepitus: No Cyanosis: No Excoriation: No Ecchymosis: Yes Friable: No Erythema: No Induration: No Hemosiderin Staining: No Rash: No Mottled: No  Scarring: Yes Pallor: No Rubor: No Moisture No Abnormalities Noted: No Temperature / Pain Dry / Scaly: No Tenderness on Palpation: Yes Maceration: No Notes Site looks much better with no surrounding redness or tenderness. Electronic Signature(s) Signed: 01/01/2018 4:33:57 PM By: Gretta Cool, BSN, RN, CWS, Kim RN, BSN Entered By: Gretta Cool, BSN, RN, CWS, Kim on 01/01/2018 11:17:07 Shannon [name] ([id number]) -------------------------------------------------------------------------------- Pain Assessment Details Patient Name: Shannon [name] Date of Service: 01/01/2018 11:00 AM Medical Record Number: [id number] Patient Account Number: [id number] Date of Birth/Sex: [birth year] (84 y.o. F) Treating RN: Montey Hora Primary Care Leslye Puccini: Eulas Post, MONICA Other Clinician: Referring Raygan Skarda: Eulas Post, MONICA Treating Zyrus Hetland/Extender: Melburn Hake, HOYT Weeks in Treatment: 2 Active Problems Location of Pain Severity and Description of Pain Patient Has Paino No Site Locations Pain Management and Medication Current Pain Management: Electronic Signature(s) Signed: 01/01/2018 3:15:29 PM By: Lorine Bears RCP, RRT, CHT Signed: 01/01/2018 5:00:45 PM By: Montey Hora Entered By: Lorine Bears on 01/01/2018 11:10:02 Shannon [name] ([id number]) -------------------------------------------------------------------------------- Patient/Caregiver Education Details Patient Name: Shannon [name] Date of Service: 01/01/2018 11:00 AM Medical Record Number: [id number] Patient Account Number: [id number] Date of Birth/Gender: 01-10-1924 (84 y.o. F) Treating RN: Montey Hora Primary Care Physician: Eulas Post, MONICA Other Clinician: Referring Physician: Eulas Post, MONICA Treating Physician/Extender: Sharalyn Ink in Treatment: 2 Education Assessment Education Provided To: Patient and Caregiver Education Topics Provided Basic Hygiene: Handouts: Other: g tube site care Methods: Explain/Verbal Responses: State content correctly Electronic Signature(s) Signed: 01/01/2018 5:00:45 PM By: Montey Hora Entered By: Montey Hora on 01/01/2018 11:49:42 Shannon [name] ([id number]) -------------------------------------------------------------------------------- [place] Details Patient Name: Shannon [name] Date of Service: 01/01/2018 11:00 AM Medical Record Number: [id number] Patient Account Number: [id number] Date of Birth/Sex: 06-18-1923 (84 y.o. F) Treating RN: Montey Hora Primary Care Perrin Gens: Eulas Post, MONICA Other Clinician: Referring Negar Sieler: Eulas Post, MONICA Treating Oral Hallgren/Extender: Melburn Hake, HOYT Weeks in Treatment: 2 Vital Signs Time Taken: 11:10 Temperature (F): 98.1 Height (in): 66 Pulse (bpm): 63 Source: Stated Respiratory Rate (breaths/min): 16 Weight (lbs): 125 Blood Pressure (mmHg): 161/61 Source: Stated Reference Range: 80 - 120 mg / dl Body Mass Index (BMI): 20.2 Electronic Signature(s) Signed: 01/01/2018 3:15:29 PM By: Lorine Bears RCP, RRT, CHT Entered By: Lorine Bears on 01/01/2018 11:14:03  Wound Cleansing - multiple wounds []  - 0 Wound Imaging (photographs - any number of wounds) []  - 0 Wound Tracing (instead of photographs) []  - 0 Simple Wound Measurement - one wound []  - 0 Complex Wound Measurement - multiple wounds INTERVENTIONS - Wound Dressings X - Small Wound Dressing one or multiple wounds 1 10 []  - 0 Medium Wound Dressing one or multiple wounds []  - 0 Large Wound Dressing one or multiple wounds []  - 0 Application of Medications - topical []  - 0 Application of Medications - injection INTERVENTIONS - Miscellaneous []  - External ear exam 0 []  - 0 Specimen Collection (cultures, biopsies, blood, body fluids, etc.) []  - 0 Specimen(s) / Culture(s) sent or taken to Lab for analysis []  - 0 Patient Transfer (multiple staff / [employer] fast streamer / Similar devices) []  - 0 Simple Staple / Suture removal (25 or less) []  - 0 Complex Staple / Suture removal (26 or more) []  - 0 Hypo / Hyperglycemic Management (close monitor of Blood Glucose) []  - 0 Ankle / Brachial Index (ABI) - do not check if billed separately X- 1 5 Vital Signs QUINCY, PRISCO ([id number]) Has the patient been seen at the hospital within the last three years: Yes Total Score: 65 Level Of Care: New/Established - Level 2 Electronic Signature(s) Signed: 01/01/2018 5:00:45 PM By: Montey Hora Entered By: Montey Hora on 01/01/2018 11:47:40 Shannon [name] ([id number]) -------------------------------------------------------------------------------- Encounter Discharge Information Details Patient Name: Shannon [name] Date of Service: 01/01/2018 11:00 AM Medical Record Number: [id number] Patient Account Number: [id number] Date of Birth/Sex: [birth year] (84 y.o. F) Treating RN: Montey Hora Primary  Care Crescencio Jozwiak: Eulas Post, MONICA Other Clinician: Referring Lucciano Vitali: Eulas Post, MONICA Treating Kyriaki Moder/Extender: Melburn Hake, HOYT Weeks in Treatment: 2 Encounter Discharge Information Items Discharge Condition: Stable Ambulatory Status: Walker Discharge Destination: Home Transportation: Private Auto Accompanied By: daughter Schedule Follow-up Appointment: Yes Clinical Summary of Care: Electronic Signature(s) Signed: 01/01/2018 5:00:45 PM By: Montey Hora Entered By: Montey Hora on 01/01/2018 11:48:49 Shannon [name] ([id number]) -------------------------------------------------------------------------------- Lower Extremity Assessment Details Patient Name: Shannon [name] Date of Service: 01/01/2018 11:00 AM Medical Record Number: [id number] Patient Account Number: [id number] Date of Birth/Sex: 1923/05/14 (84 y.o. F) Treating RN: Cornell Barman Primary Care Anayelli Lai: Gildardo Cranker Other Clinician: Referring Saanvika Vazques: Eulas Post, MONICA Treating Arlisa Leclere/Extender: Sharalyn Ink in Treatment: 2 Electronic Signature(s) Signed: 01/01/2018 4:33:57 PM By: Gretta Cool, BSN, RN, CWS, Kim RN, BSN Entered By: Gretta Cool, BSN, RN, CWS, Kim on 01/01/2018 11:16:02 Shannon [name] ([id number]) -------------------------------------------------------------------------------- Multi-Disciplinary Care Plan Details Patient Name: Shannon [name] Date of Service: 01/01/2018 11:00 AM Medical Record Number: [id number] Patient Account Number: [id number] Date of Birth/Sex: 02-18-1924 (84 y.o. F) Treating RN: Montey Hora Primary Care Noelle Sease: Gildardo Cranker Other Clinician: Referring Demitra Danley: Eulas Post, MONICA Treating Kilah Drahos/Extender: Melburn Hake, HOYT Weeks in Treatment: 2 Active Inactive Electronic Signature(s) Signed: 01/01/2018 5:00:45 PM By: Montey Hora Entered By: Montey Hora on 01/01/2018 11:33:03 Shannon [name] ([id number]) -------------------------------------------------------------------------------- Non-Wound  Condition Assessment Details Patient Name: Shannon [name] Date of Service: 01/01/2018 11:00 AM Medical Record Number: [id number] Patient Account Number: [id number] Date of Birth/Sex: Nov 27, 1923 (84 y.o. F) Treating RN: Cornell Barman Primary Care Hitesh Fouche: Eulas Post, MONICA Other Clinician: Referring Valarie Farace: Eulas Post, MONICA Treating Aryani Daffern/Extender: Melburn Hake, HOYT Weeks in Treatment: 2 Non-Wound Condition: Condition: Rash / Dermatitis Location: Abdomen Side: Left Periwound Skin Texture Texture Color No Abnormalities Noted: No No Abnormalities Noted: No Callus: No Atrophie Blanche: No Crepitus: No Cyanosis: No Excoriation: No Ecchymosis: Yes Friable: No Erythema: No Induration: No Hemosiderin Staining: No Rash: No Mottled: No

## 2018-01-05 NOTE — Progress Notes (Signed)
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 F) Treating RN: Montey Hora Primary Care Provider: Eulas Post, MONICA Other Clinician: Referring Provider: Eulas Post, MONICA Treating Provider/Extender: Melburn Hake, HOYT Weeks in Treatment: 2 Subjective Chief Complaint Information obtained from Patient Rash around G-tube site History of Present Illness (HPI) 08/08/16; this is a 84 year old women who arrives in our clinic referred from her podiatrist Dr. Amalia Hailey. She is a type 2 diabetic with PAD who recently came to the attention of vein and vascular in [place]. She presented with a black toe on the left foot. She was admitted to hospital with this from 07/28/16-08/01/16. MRI of the foot suggested a tiny subcutaneous abscess at the base of the second proximal phalynx suggested osteomyelitis. She did not wish to consider amputation or angiography. She received vanc and cephapime in the hospital discharged on clindamycin for a month. She is on Coumadin. Stage 3  crf, Previous non invasive arterial studies showed diffuse plaque with circumferential calcified vessels. monophasic waveforms in the superficial femoral artery and below. Patient is in moderate pain. They are only using dial soaks. and gauze. 08/22/16 Patient returns to clinic with ischemic wounds on her left 2nd,3rd, and first toes. She denies severe pain,fever or other systemic symptoms. Tells me she was in hospital for pneumonia. I will check these records. She states she received different antibiotics from the clindamycin she was on. she has been using silver alginate. [facility]. She again tells me she does not wish to have attempts at revascularization or amputation. She does not feel she wound survive the surgery. 09/05/16; the patient continues to have gradual deterioration in the condition of the left foot. She has mummification of the left second toe wound on the left third toe, necrotic tissue on the plantar aspect of the left second toe. She had previously refused angiography or amputation. She was seen by vein and vascular in [place]. She is using silver alginate. She seems a little more accepting of the thought of surgery today I think she has been talking to her family. She asked me at what level her leg would need to be amputated Readmission: 12/17/17 patient is seen for reevaluation today in the clinic although for a different issue. She was previously seen for toe ulcers although she has been seen by Dr. Amalia Hailey and had amputation and multiple locations were not looking at this at all today. The reason she comes in today is evaluation of her G-tube site where she has a lot of irritation and drainage around the edge of this. She does have a history of diabetes mellitus type II she is on medicine for this and is a hemoglobin A1c of 6.8 noted on 12/10/17. She also is on chronic Coumadin therapy at this point in doing well in that regard according to her family. She's  previously used nystatin powder under her breasts when she had some bread/irritated region but she has not used any around the G-tube site. She does have stage III renal failure. The area does bother her as far as  know. Electronic Signature(s) Signed: 01/02/2018 8:03:16 PM By: Worthy Keeler PA-C Entered By: Worthy Keeler on 01/01/2018 11:57:32 Shannon [name] ([id number]) -------------------------------------------------------------------------------- ROS/PFSH Details Patient Name: Shannon [name] Date of Service: 01/01/2018 11:00 AM Medical Record Number: [id number] Patient Account Number: [id number] Date of Birth/Sex: [birth year] (84 y.o. F) Treating RN: Montey Hora Primary Care Provider: Eulas Post, MONICA Other Clinician: Referring Provider: Eulas Post, MONICA Treating Provider/Extender: Melburn Hake, HOYT Weeks in Treatment: 2 Information Obtained From Patient Wound History Do you currently have one or more open woundso Yes How many open wounds do you currently haveo 1 Approximately how long have you had your woundso 6 weeks How have you been treating your wound(s) until nowo barrier cream Has your wound(s) ever healed and then re-openedo No Have you had any lab work done in the past montho No Have you tested positive for an antibiotic resistant organism (MRSA, VRE)o No Have you tested positive for osteomyelitis (bone infection)o No Have you had any tests for circulation on your legso No Constitutional Symptoms (General Health) Complaints and Symptoms: Negative for: Fever; Chills Eyes Medical History: Positive for: Cataracts Negative for: Glaucoma; Optic Neuritis Ear/Nose/Mouth/Throat Medical History: Negative for: Chronic sinus problems/congestion; Middle ear problems Hematologic/Lymphatic Medical History: Positive for:  Anemia Negative for: Hemophilia; Human Immunodeficiency Virus; Lymphedema; Sickle Cell Disease Respiratory Medical History: Positive for: Asthma; Chronic Obstructive Pulmonary Disease (COPD) Negative for: Aspiration; Pneumothorax; Sleep Apnea; Tuberculosis Cardiovascular Medical History: Positive for: Arrhythmia; Congestive Heart Failure; Coronary Artery Disease; Deep Vein Thrombosis; Hypertension Negative for: Angina; Hypotension; Myocardial Infarction; Peripheral Arterial Disease; Peripheral Venous Disease; Phlebitis; Vasculitis Gastrointestinal Shannon [name], Shannon [name] ([id number]) Medical History: Negative for: Cirrhosis ; Colitis; Crohnos; Hepatitis A; Hepatitis B; Hepatitis C Past Medical History Notes: GERD, Small bowel obstruction ,Paraesophageal hernia o o Perforated gastric ulcer (HCC)History of stomach ulcers, Gastric stenosis o o a. s/p stomach tube Endocrine Medical History: Positive for: Type II Diabetes Negative for: Type I Diabetes Time with diabetes: years Treated with: Insulin Blood sugar tested every day: Yes Tested : Blood sugar testing results: Bedtime: 240 Genitourinary Medical History: Negative for: End Stage Renal Disease Immunological Medical History: Negative for: Lupus Erythematosus; Raynaudos; Scleroderma Integumentary (Skin) Medical History: Negative for: History of Burn; History of pressure wounds Musculoskeletal Medical History: Positive for: Osteoarthritis Negative for: Gout; Rheumatoid Arthritis; Osteomyelitis Neurologic Medical History: Positive for: Neuropathy Negative for: Dementia; Quadriplegia; Paraplegia; Seizure Disorder Oncologic Medical History: Negative for: Received Chemotherapy; Received Radiation Psychiatric Complaints and Symptoms: No Complaints or Symptoms Medical History: Negative for: Anorexia/bulimia; Confinement Anxiety Shannon [name], Shannon [name] ([id number]) HBO Extended History Items Eyes: Cataracts Immunizations Pneumococcal  Vaccine: Received Pneumococcal Vaccination: Yes Implantable Devices Family and Social History Cancer: No; Diabetes: Yes - Siblings,Paternal Grandparents,Maternal Grandparents; Heart Disease: Yes - Siblings,Paternal Grandparents; Hereditary Spherocytosis: No; Hypertension: Yes - Siblings; Kidney Disease: No; Lung Disease: Yes - Siblings,Maternal Grandparents; Seizures: No; Stroke: Yes - Siblings; Thyroid Problems: Yes - Siblings; Tuberculosis: No; Never smoker; Marital Status - Widowed; Alcohol Use: Never; Drug Use: No History; Caffeine Use: Moderate; Financial Concerns: No; Food, Clothing or Shelter Needs: No; Support System Lacking: No; Transportation Concerns: No; Advanced Directives: Yes (Not Provided); Patient does not want information on Advanced Directives; Living Will: Yes (Not Provided) Physician Affirmation I have reviewed and agree with the above information. Electronic Signature(s) Signed: 01/01/2018 5:00:45 PM By: Montey Hora Signed: 01/02/2018 8:03:16 PM By: Worthy Keeler PA-C Entered By: Worthy Keeler on 01/01/2018 11:56:33 Shannon [name] ([id number]) -------------------------------------------------------------------------------- SuperBill Details Patient Name: Shannon [name] Date of Service: 01/01/2018 Medical Record  know. Electronic Signature(s) Signed: 01/02/2018 8:03:16 PM By: Worthy Keeler PA-C Entered By: Worthy Keeler on 01/01/2018 11:57:32 Shannon [name] ([id number]) -------------------------------------------------------------------------------- ROS/PFSH Details Patient Name: Shannon [name] Date of Service: 01/01/2018 11:00 AM Medical Record Number: [id number] Patient Account Number: [id number] Date of Birth/Sex: [birth year] (84 y.o. F) Treating RN: Montey Hora Primary Care Provider: Eulas Post, MONICA Other Clinician: Referring Provider: Eulas Post, MONICA Treating Provider/Extender: Melburn Hake, HOYT Weeks in Treatment: 2 Information Obtained From Patient Wound History Do you currently have one or more open woundso Yes How many open wounds do you currently haveo 1 Approximately how long have you had your woundso 6 weeks How have you been treating your wound(s) until nowo barrier cream Has your wound(s) ever healed and then re-openedo No Have you had any lab work done in the past montho No Have you tested positive for an antibiotic resistant organism (MRSA, VRE)o No Have you tested positive for osteomyelitis (bone infection)o No Have you had any tests for circulation on your legso No Constitutional Symptoms (General Health) Complaints and Symptoms: Negative for: Fever; Chills Eyes Medical History: Positive for: Cataracts Negative for: Glaucoma; Optic Neuritis Ear/Nose/Mouth/Throat Medical History: Negative for: Chronic sinus problems/congestion; Middle ear problems Hematologic/Lymphatic Medical History: Positive for:  Anemia Negative for: Hemophilia; Human Immunodeficiency Virus; Lymphedema; Sickle Cell Disease Respiratory Medical History: Positive for: Asthma; Chronic Obstructive Pulmonary Disease (COPD) Negative for: Aspiration; Pneumothorax; Sleep Apnea; Tuberculosis Cardiovascular Medical History: Positive for: Arrhythmia; Congestive Heart Failure; Coronary Artery Disease; Deep Vein Thrombosis; Hypertension Negative for: Angina; Hypotension; Myocardial Infarction; Peripheral Arterial Disease; Peripheral Venous Disease; Phlebitis; Vasculitis Gastrointestinal Shannon [name], Shannon [name] ([id number]) Medical History: Negative for: Cirrhosis ; Colitis; Crohnos; Hepatitis A; Hepatitis B; Hepatitis C Past Medical History Notes: GERD, Small bowel obstruction ,Paraesophageal hernia o o Perforated gastric ulcer (HCC)History of stomach ulcers, Gastric stenosis o o a. s/p stomach tube Endocrine Medical History: Positive for: Type II Diabetes Negative for: Type I Diabetes Time with diabetes: years Treated with: Insulin Blood sugar tested every day: Yes Tested : Blood sugar testing results: Bedtime: 240 Genitourinary Medical History: Negative for: End Stage Renal Disease Immunological Medical History: Negative for: Lupus Erythematosus; Raynaudos; Scleroderma Integumentary (Skin) Medical History: Negative for: History of Burn; History of pressure wounds Musculoskeletal Medical History: Positive for: Osteoarthritis Negative for: Gout; Rheumatoid Arthritis; Osteomyelitis Neurologic Medical History: Positive for: Neuropathy Negative for: Dementia; Quadriplegia; Paraplegia; Seizure Disorder Oncologic Medical History: Negative for: Received Chemotherapy; Received Radiation Psychiatric Complaints and Symptoms: No Complaints or Symptoms Medical History: Negative for: Anorexia/bulimia; Confinement Anxiety Shannon [name], Shannon [name] ([id number]) HBO Extended History Items Eyes: Cataracts Immunizations Pneumococcal  Vaccine: Received Pneumococcal Vaccination: Yes Implantable Devices Family and Social History Cancer: No; Diabetes: Yes - Siblings,Paternal Grandparents,Maternal Grandparents; Heart Disease: Yes - Siblings,Paternal Grandparents; Hereditary Spherocytosis: No; Hypertension: Yes - Siblings; Kidney Disease: No; Lung Disease: Yes - Siblings,Maternal Grandparents; Seizures: No; Stroke: Yes - Siblings; Thyroid Problems: Yes - Siblings; Tuberculosis: No; Never smoker; Marital Status - Widowed; Alcohol Use: Never; Drug Use: No History; Caffeine Use: Moderate; Financial Concerns: No; Food, Clothing or Shelter Needs: No; Support System Lacking: No; Transportation Concerns: No; Advanced Directives: Yes (Not Provided); Patient does not want information on Advanced Directives; Living Will: Yes (Not Provided) Physician Affirmation I have reviewed and agree with the above information. 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 F) Treating RN: Montey Hora Primary Care Provider: Eulas Post, MONICA Other Clinician: Referring Provider: Eulas Post, MONICA Treating Provider/Extender: Melburn Hake, HOYT Weeks in Treatment: 2 Subjective Chief Complaint Information obtained from Patient Rash around G-tube site History of Present Illness (HPI) 08/08/16; this is a 84 year old women who arrives in our clinic referred from her podiatrist Dr. Amalia Hailey. She is a type 2 diabetic with PAD who recently came to the attention of vein and vascular in [place]. She presented with a black toe on the left foot. She was admitted to hospital with this from 07/28/16-08/01/16. MRI of the foot suggested a tiny subcutaneous abscess at the base of the second proximal phalynx suggested osteomyelitis. She did not wish to consider amputation or angiography. She received vanc and cephapime in the hospital discharged on clindamycin for a month. She is on Coumadin. Stage 3  crf, Previous non invasive arterial studies showed diffuse plaque with circumferential calcified vessels. monophasic waveforms in the superficial femoral artery and below. Patient is in moderate pain. They are only using dial soaks. and gauze. 08/22/16 Patient returns to clinic with ischemic wounds on her left 2nd,3rd, and first toes. She denies severe pain,fever or other systemic symptoms. Tells me she was in hospital for pneumonia. I will check these records. She states she received different antibiotics from the clindamycin she was on. she has been using silver alginate. [facility]. She again tells me she does not wish to have attempts at revascularization or amputation. She does not feel she wound survive the surgery. 09/05/16; the patient continues to have gradual deterioration in the condition of the left foot. She has mummification of the left second toe wound on the left third toe, necrotic tissue on the plantar aspect of the left second toe. She had previously refused angiography or amputation. She was seen by vein and vascular in [place]. She is using silver alginate. She seems a little more accepting of the thought of surgery today I think she has been talking to her family. She asked me at what level her leg would need to be amputated Readmission: 12/17/17 patient is seen for reevaluation today in the clinic although for a different issue. She was previously seen for toe ulcers although she has been seen by Dr. Amalia Hailey and had amputation and multiple locations were not looking at this at all today. The reason she comes in today is evaluation of her G-tube site where she has a lot of irritation and drainage around the edge of this. She does have a history of diabetes mellitus type II she is on medicine for this and is a hemoglobin A1c of 6.8 noted on 12/10/17. She also is on chronic Coumadin therapy at this point in doing well in that regard according to her family. She's  previously used nystatin powder under her breasts when she had some bread/irritated region but she has not used any around the G-tube site. She does have stage III renal failure. The area does bother her as far as  Number: [id number] Patient Account Number: [id number] Date of Birth/Sex: 03-31-1924 (84 y.o. 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 G41.7 Gastrostomy status Z79.01 Long term (current) use of anticoagulants Facility Procedures CPT4 Code: [id number] Description: [phone] - WOUND CARE VISIT-LEV 2 EST PT Modifier: Quantity: 1 Physician Procedures CPT4 Code: [id number] Description: 03795  - WC PHYS LEVEL 3 - EST PT ICD-10 Diagnosis Description B35.4 Tinea corporis E11.628 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 She does have stage III renal failure. The area does bother her as far as pain is concerned around the G-tube site. 12/25/17 on evaluation today the patient actually appears to be doing much better in regard to the G-tube site currently. I did look at the picture from last week compared this week and there is significant improvement in the overall erythema in comparison between the two weeks. Fortunately there's no evidence of infection at this time which is good news as far as bacterial infection is concerned. Nonetheless I do think that she may benefit from using a little bit of triamcinolone around the area of erythema before applying the nystatin powder. Fortunately the patient states the pain is significantly improved. 01/01/18 on evaluation today patient actually appears to be doing excellent in regard to her G-tube site. This appears to be almost completely resolved before the irritation is concerned in fact I think that she's at the point where we can discharge from wound care services as there is nothing open irritation has improved and I think with just one more week of the current regimen she will do very well. Obscene if anything changes she can always let us know. Electronic Signature(s) Signed: 01/02/2018 8:03:16 PM By: Worthy Keeler PA-C Entered By: Worthy Keeler on 01/01/2018 11:56:17 Shannon [name] ([id number]) Shannon [name], Shannon [name] ([id number]) -------------------------------------------------------------------------------- Physical Exam Details Patient Name: Shannon [name] Date of Service: 01/01/2018 11:00 AM Medical Record Number: [id number] Patient Account Number: [id number] Date of Birth/Sex: 30-Aug-1923 (84 y.o. F) Treating RN: Montey Hora Primary Care Provider: Eulas Post, MONICA Other Clinician: Referring Provider: Eulas Post, MONICA Treating Provider/Extender: STONE III, HOYT Weeks in Treatment: 2 Constitutional Well-nourished and well-hydrated in no acute  distress. Respiratory normal breathing without difficulty. Psychiatric this patient is able to make decisions and demonstrates good insight into disease process. Alert and Oriented x 3. pleasant and cooperative. Notes Patient's site around her G-tube actually appears to be doing very well. Erythema has greatly improved and there does not appear to be any skin breakdown which is good news. Electronic Signature(s) Signed: 01/02/2018 8:03:16 PM By: Worthy Keeler PA-C Entered By: Worthy Keeler on 01/01/2018 11:56:46 Shannon [name] ([id number]) -------------------------------------------------------------------------------- Physician Orders Details Patient Name: Shannon [name] Date of Service: 01/01/2018 11:00 AM Medical Record Number: [id number] Patient Account Number: [id number] Date of Birth/Sex: [birth year] (84 y.o. 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 Electronic Signature(s) Signed: 01/01/2018 5:00:45 PM By: Montey Hora Signed: 01/02/2018 8:03:16 PM By: Worthy Keeler PA-C Entered By: Montey Hora on 01/01/2018 11:34:08 Shannon [name] ([id number]) -------------------------------------------------------------------------------- Problem List Details Patient Name: Shannon [name] Date of Service: 01/01/2018 11:00 AM Medical Record Number: [id number] Patient Account Number: [id number] Date of Birth/Sex:  1924-01-30 (84 y.o. F) Treating RN: Montey Hora Primary Care Provider: Eulas Post, MONICA Other Clinician: Referring Provider: Eulas Post, MONICA Treating Provider/Extender: Melburn Hake, HOYT Weeks in Treatment: 2 Active Problems ICD-10 Evaluated Encounter Code Description Active Date Today Diagnosis B35.4 Tinea corporis 12/17/2017 No Yes

## 2018-01-07 ENCOUNTER — Other Ambulatory Visit: Payer: Self-pay | Admitting: Internal Medicine

## 2018-01-07 DIAGNOSIS — I5032 Chronic diastolic (congestive) heart failure: Secondary | ICD-10-CM

## 2018-01-09 ENCOUNTER — Other Ambulatory Visit: Payer: Self-pay | Admitting: Internal Medicine

## 2018-01-14 ENCOUNTER — Ambulatory Visit (INDEPENDENT_AMBULATORY_CARE_PROVIDER_SITE_OTHER): Payer: Medicare Other | Admitting: Podiatry

## 2018-01-14 DIAGNOSIS — L97512 Non-pressure chronic ulcer of other part of right foot with fat layer exposed: Secondary | ICD-10-CM

## 2018-01-14 DIAGNOSIS — E0859 Diabetes mellitus due to underlying condition with other circulatory complications: Secondary | ICD-10-CM | POA: Diagnosis not present

## 2018-01-14 DIAGNOSIS — I70235 Atherosclerosis of native arteries of right leg with ulceration of other part of foot: Secondary | ICD-10-CM | POA: Diagnosis not present

## 2018-01-15 NOTE — Progress Notes (Signed)
Subjective:  84 year old female presenting today for follow up evaluation of ulcerations to the right foot. She states she is doing well and believes they are healing appropriately. There are no modifying factors noted. She has been using gentamicin cream on the wounds as directed. Patient is here for further evaluation and treatment.   Past Medical History:  Diagnosis Date  . Acute respiratory failure ([place])   . Anemia    previous blood transfusions  . Arthritis    "all over"  . Asthma   . Bradycardia    requiring previous d/c of BB and reduction of amiodarone  . CAD (coronary artery disease)    nonobstructive per notes  . Chronic diastolic CHF (congestive heart failure) ([place])   . CKD (chronic kidney disease), stage III ([place])   . Complication of blood transfusion    "got the wrong blood type at [facility] in ~ 2015; no adverse reaction that we are aware of"/daughter, [name] (01/27/2016)  . COPD (chronic obstructive pulmonary disease) ([place])   . Depression    "light case"  . DVT (deep venous thrombosis) ([place]) 01/2016   a. LLE DVT 01/2016 - switched from Eliquis to Coumadin.  . Gastric stenosis    a. s/p stomach tube  . GERD (gastroesophageal reflux disease)   . History of blood transfusion    "several" (01/27/2016)  . History of stomach ulcers   . Hyperlipidemia   . Hypertension   . Hypothyroidism   . On home oxygen therapy    "2L; 9PM til 9AM" (06/27/2017)  . Paraesophageal hernia   . Perforated gastric ulcer ([place])   . Pneumonia    "a few times" (06/27/2017)  . Seasonal allergies   . SIADH (syndrome of inappropriate ADH production) ([place])    [name] 01/10/2015  . Small bowel obstruction ([place])    "I don't know how many" (01/11/2015)  . Stroke ([place])    "light one"  . Type II diabetes mellitus ([place])    "related to prednisone use  for > 20 yr; once predinose stopped; no more DM RX" (01/27/2016)  . UTI (urinary tract infection) 02/08/2016  . Ventral hernia with bowel  obstruction       Objective/Physical Exam General: The patient is alert and oriented x3 in no acute distress.  Dermatology:  Wound #1 noted to the right hallux measuring 1.0 x 0.9 x 0.2 cm (LxWxD).   To the noted ulceration(s), there is no eschar. There is a moderate amount of slough, fibrin, and necrotic tissue noted. Granulation tissue and wound base is red. There is a minimal amount of serosanguineous drainage noted.  There does appear to be some exposed bone to both ulceration sites.  There is no malodor. Periwound integrity is intact.  Wound noted to the right fourth toe has healed. Complete re-epithelialization has occurred. No drainage noted.   Vascular: NonPalpable pedal pulses bilaterally. No edema or erythema noted.   Neurological: Epicritic and protective threshold diminished bilaterally.   Musculoskeletal Exam: Range of motion within normal limits to all pedal and ankle joints bilateral. Muscle strength 5/5 in all groups bilateral.   Assessment: #1 ulceration of the right hallux secondary to diabetes mellitus  #2 ulceration of the right fourth toe secondary to diabetes mellitus - healed  #3 diabetes mellitus w/ peripheral neuropathy   Plan of Care:  #1 Patient was evaluated.  #2 Medically necessary excisional debridement including subcutaneous tissue was performed using a tissue nipper and a chisel blade. Excisional debridement of all  the necrotic nonviable tissue down to healthy bleeding viable tissue was performed with post-debridement measurements same as pre-. #3 The wound was cleansed and dry sterile dressing applied. #4 Continue using either gentamicin cream or betadine daily.  #5 Continue using post op shoe.  #6 Return to clinic in 4 weeks.    Edrick Kins, DPM [facility]  Dr. Edrick Kins, [address]                                        [place], [place] [address]                Office [phone]  Fax [phone]

## 2018-01-17 ENCOUNTER — Other Ambulatory Visit: Payer: Self-pay | Admitting: Internal Medicine

## 2018-01-18 DIAGNOSIS — M25512 Pain in left shoulder: Secondary | ICD-10-CM | POA: Diagnosis not present

## 2018-01-18 DIAGNOSIS — M19012 Primary osteoarthritis, left shoulder: Secondary | ICD-10-CM | POA: Diagnosis not present

## 2018-01-18 DIAGNOSIS — M19019 Primary osteoarthritis, unspecified shoulder: Secondary | ICD-10-CM | POA: Diagnosis not present

## 2018-01-21 DIAGNOSIS — M19019 Primary osteoarthritis, unspecified shoulder: Secondary | ICD-10-CM | POA: Insufficient documentation

## 2018-01-21 DIAGNOSIS — H401132 Primary open-angle glaucoma, bilateral, moderate stage: Secondary | ICD-10-CM | POA: Diagnosis not present

## 2018-01-21 DIAGNOSIS — H16223 Keratoconjunctivitis sicca, not specified as Sjogren's, bilateral: Secondary | ICD-10-CM | POA: Diagnosis not present

## 2018-01-22 DIAGNOSIS — H16223 Keratoconjunctivitis sicca, not specified as Sjogren's, bilateral: Secondary | ICD-10-CM | POA: Diagnosis not present

## 2018-01-23 ENCOUNTER — Other Ambulatory Visit: Payer: Self-pay | Admitting: Cardiology

## 2018-01-24 ENCOUNTER — Ambulatory Visit (INDEPENDENT_AMBULATORY_CARE_PROVIDER_SITE_OTHER): Payer: Medicare Other | Admitting: *Deleted

## 2018-01-24 DIAGNOSIS — Z5181 Encounter for therapeutic drug level monitoring: Secondary | ICD-10-CM | POA: Diagnosis not present

## 2018-01-24 DIAGNOSIS — I4892 Unspecified atrial flutter: Secondary | ICD-10-CM

## 2018-01-24 LAB — POCT INR: INR: 3.1 — AB (ref 2.0–3.0)

## 2018-01-24 NOTE — Patient Instructions (Signed)
Description   Today take 1 tablet then continue on same dosage 2 tablets daily except 1 tablet on Fridays.  Recheck in 4 weeks.  Call [facility] with any new medications [phone].

## 2018-01-26 IMAGING — DX DG CHEST 2V
2 series · 2 of 2 positions shown · non-contrast
Comparison: 08/02/2015 and chest CT dated 07/07/2015.

CLINICAL DATA: Abdominal pain, nausea and vomiting.
Gastroesophageal reflux.

EXAM:
CHEST  2 VIEW

[chest lat]
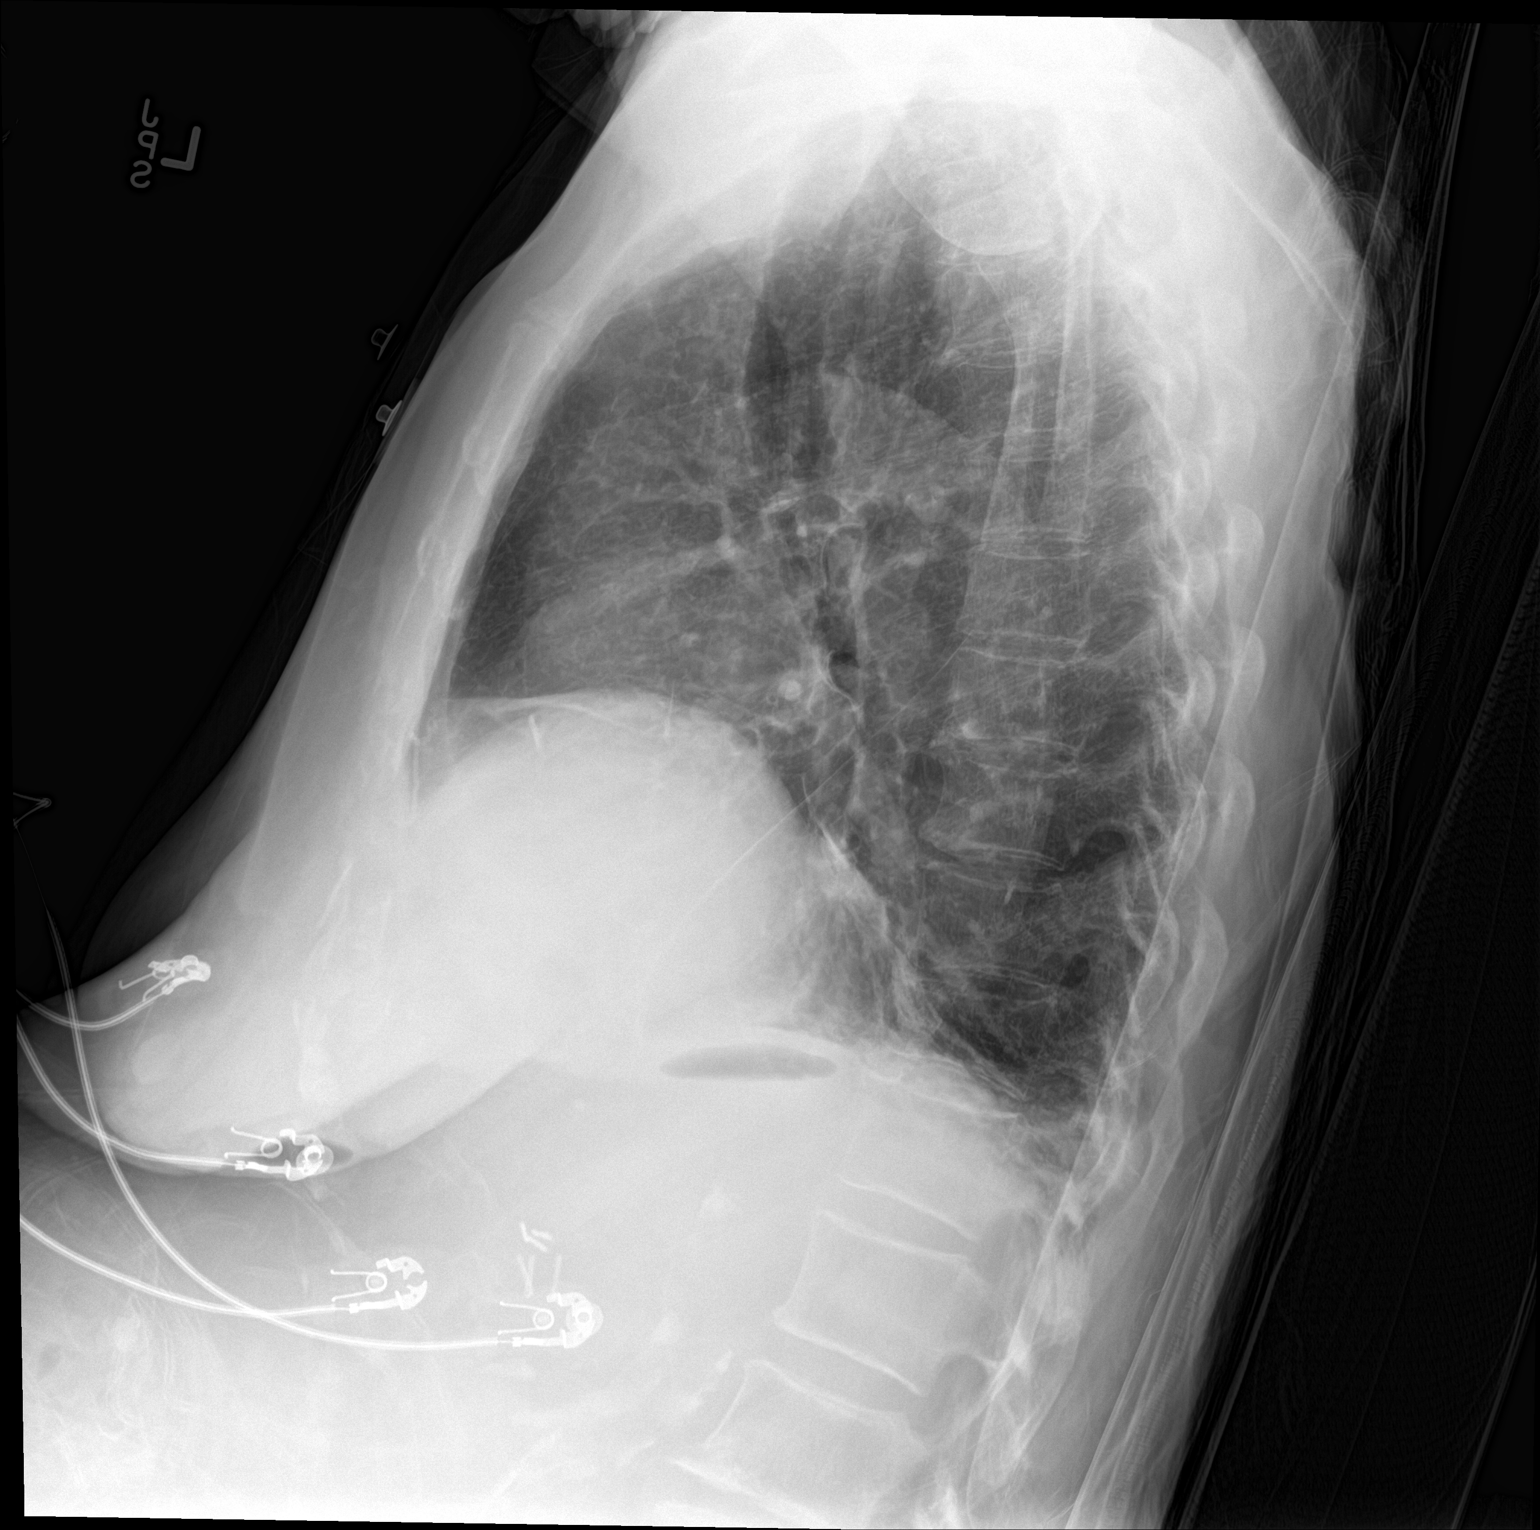

[chest ap]
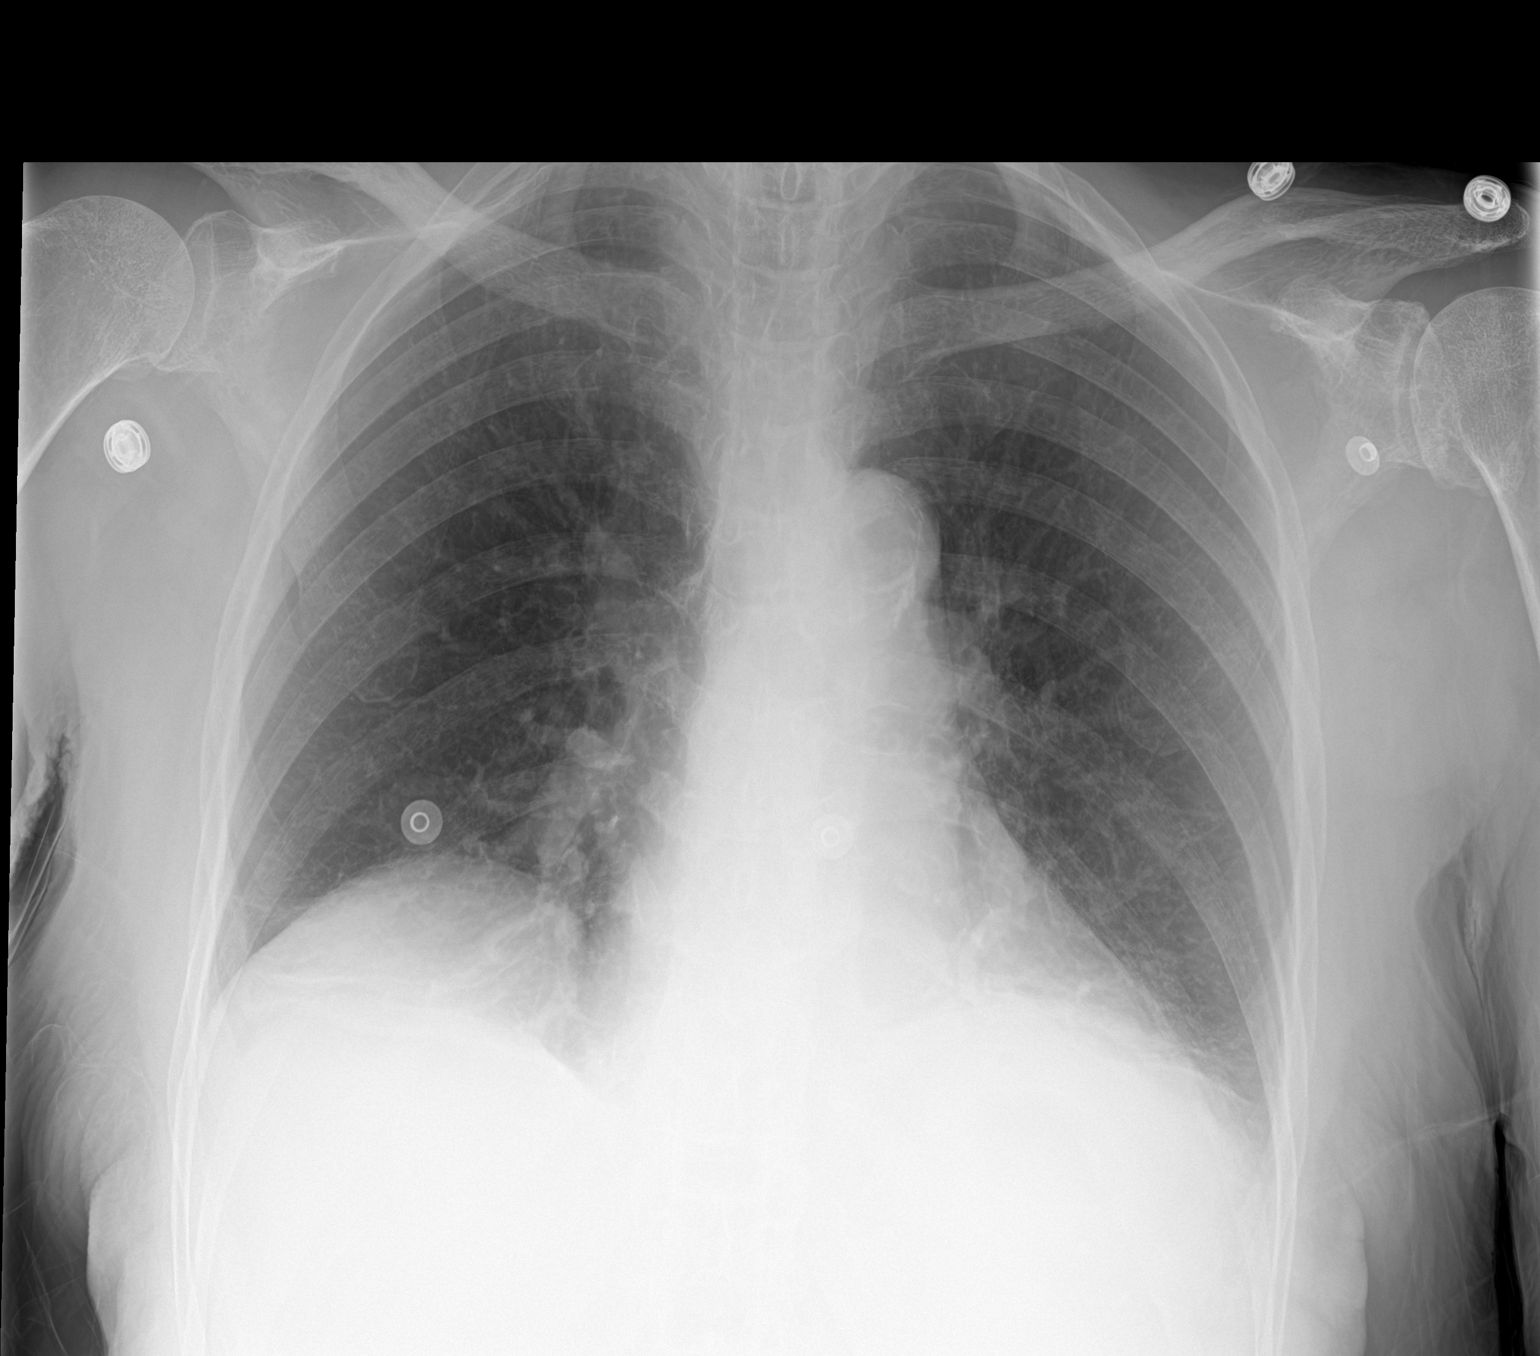

[2 of 2 positions shown; findings below may reference images not displayed]

FINDINGS: Borderline enlarged cardiac silhouette with an interval decrease in
size. Linear density at both lung bases. Mildly prominent pulmonary
vasculature. Mild diffuse peribronchial thickening. Stable right
diaphragmatic eventration. Atheromatous arterial calcifications.
Upper abdominal surgical clips. Diffuse osteopenia. Bilateral
shoulder degenerative changes.
IMPRESSION: 1. Mildly improved left basilar linear atelectasis and possible
scarring.
2. Interval mild right basilar linear atelectasis.
3. Improved cardiomegaly.
4. Mild pulmonary vascular congestion.
5. Mild bronchitic changes.

## 2018-01-26 IMAGING — CT CT ABD-PELV W/O CM
2 of 4 series · 16 of 46 positions shown, 18 images · non-contrast
Comparison: 07/05/2015

CLINICAL DATA: Left upper quadrant pain with nausea and vomiting
for 1 week.

EXAM:
CT ABDOMEN AND PELVIS WITHOUT CONTRAST
TECHNIQUE: Multidetector CT imaging of the abdomen and pelvis was performed
following the standard protocol without IV contrast.

[Series 2: a/p w/o 5mm · axial · non-contrast · 0.79mm/px · z∈[+811,+1206]mm · 13 of 87 slices shown, 15 images]
[im 4/87  soft-tissue]
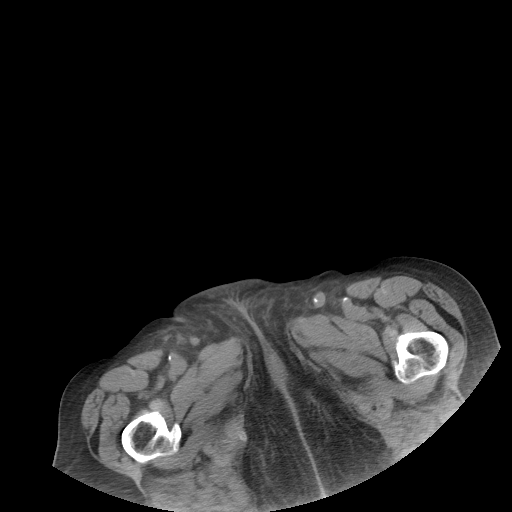
[im 4/87  bone]
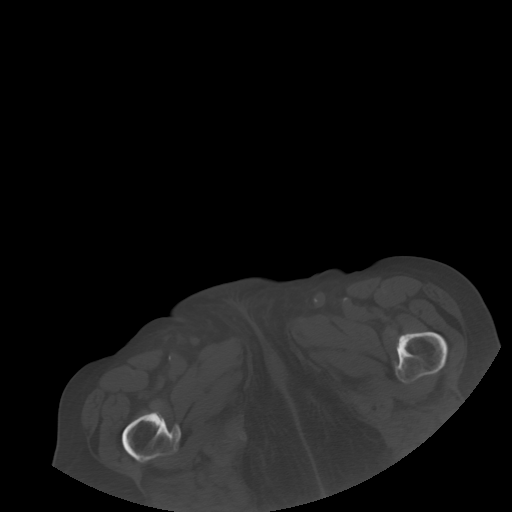
[im 11/87  soft-tissue]
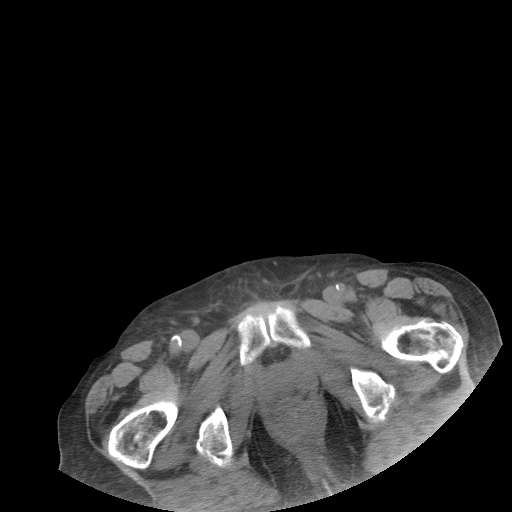
[im 18/87  soft-tissue]
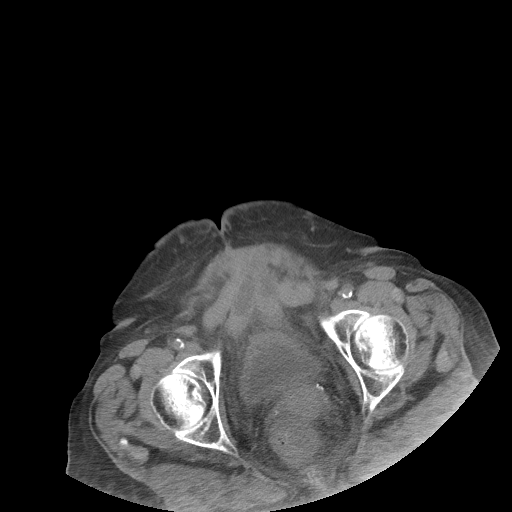
[im 26/87  soft-tissue]
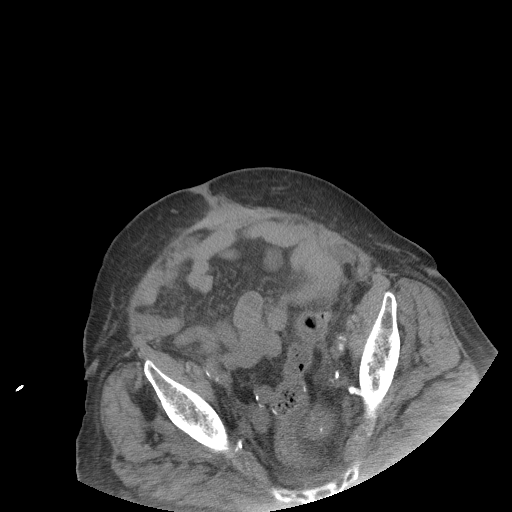
[im 29/87  soft-tissue]
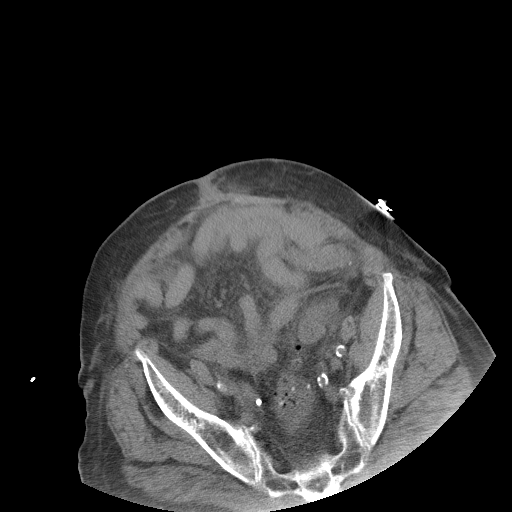
[im 36/87  soft-tissue]
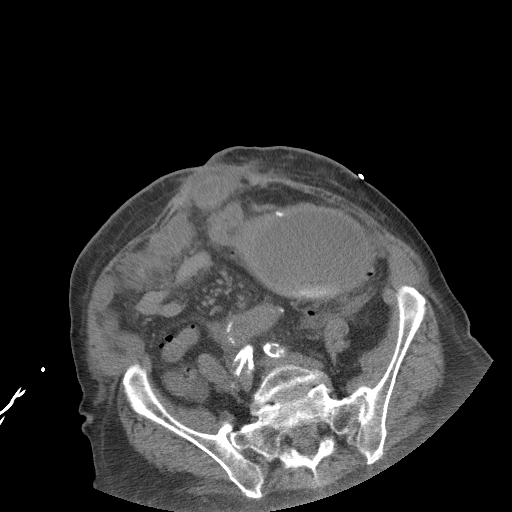
[im 44/87  soft-tissue]
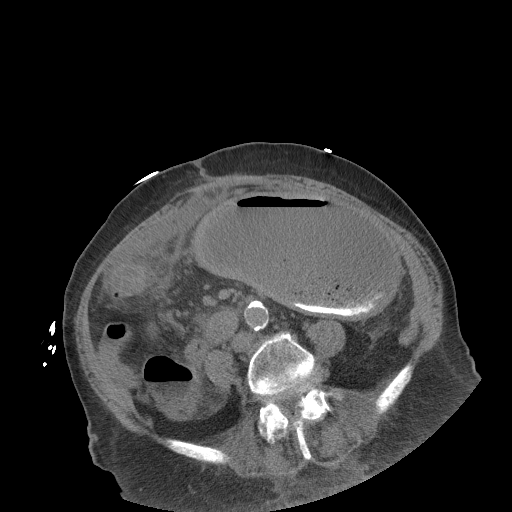
[im 51/87  soft-tissue]
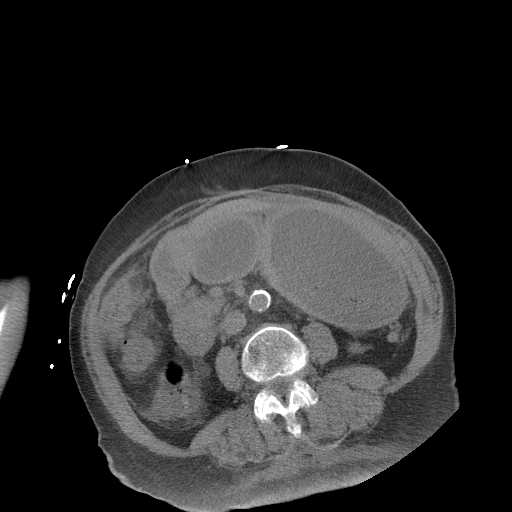
[im 58/87  soft-tissue]
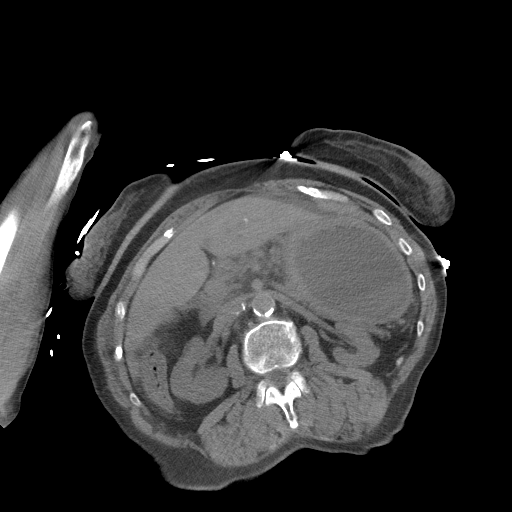
[im 58/87  bone]
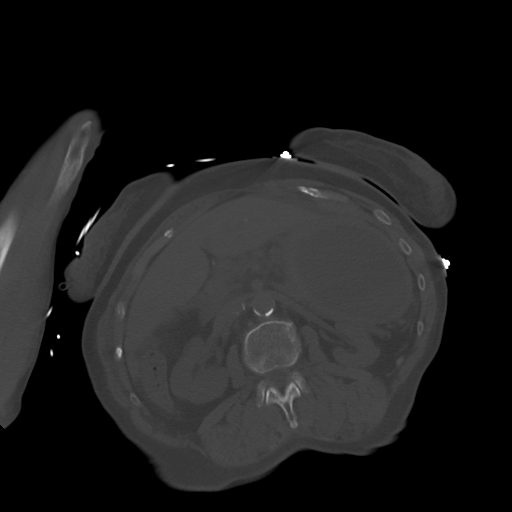
[im 61/87  soft-tissue]
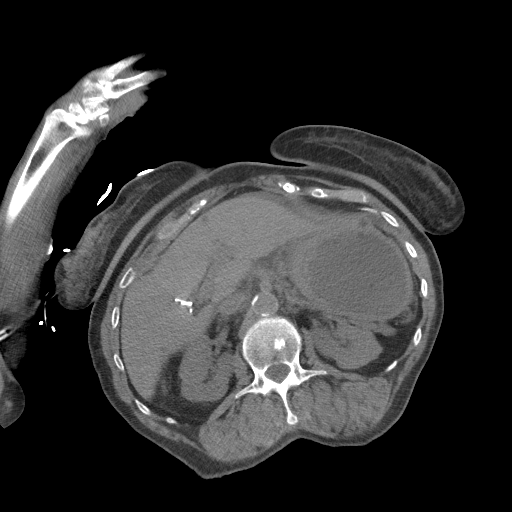
[im 69/87  soft-tissue]
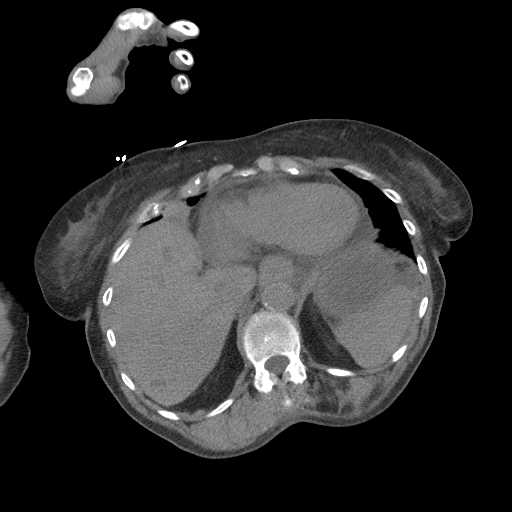
[im 76/87  soft-tissue]
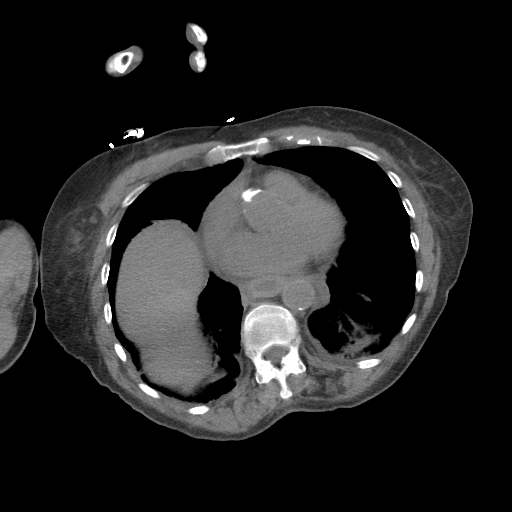
[im 83/87  soft-tissue]
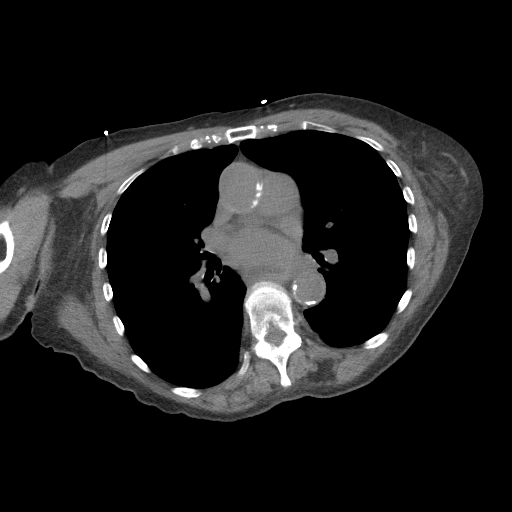

[Series 5: a/p w/o cor · coronal · non-contrast · 0.82mm/px · 3 of 163 slices shown]
[im 55/163  soft-tissue]
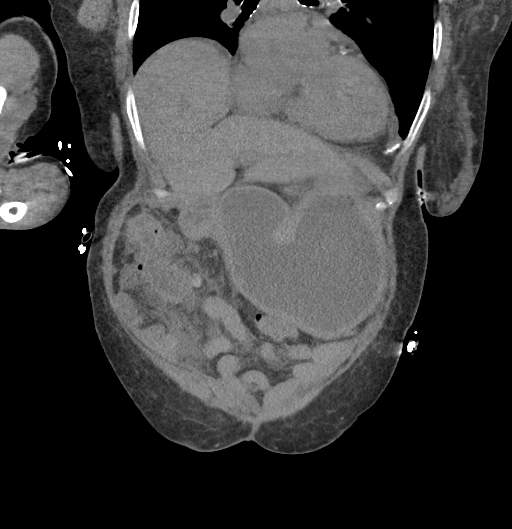
[im 73/163  soft-tissue]
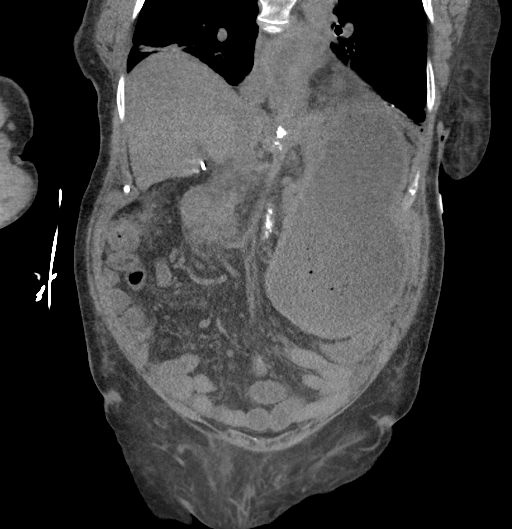
[im 91/163  soft-tissue]
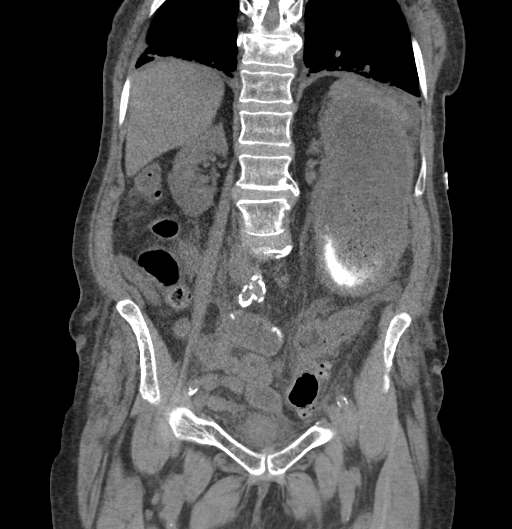

[16 of 46 positions shown; findings below may reference images not displayed]

FINDINGS: Lower chest and abdominal wall: Extensive ventral abdominal wall
scarring with a fatty hernia left of midline, likely incisional.

There is fluid within the thick walled lower esophagus which
contains refluxed or non cleared fluid.

Eventration of the right diaphragm.

Hepatobiliary: Multiple hepatic low densities fairly circumscribed
and likely cysts.Cholecystectomy.

Pancreas: Unremarkable.

Spleen: Unremarkable.

Adrenals/Urinary Tract: Negative adrenals. No hydronephrosis or
stone. Bladder wall thickening. Very Peristranding could be related
to generalized edema or cystitis.

Reproductive:No pathologic findings.

Stomach/Bowel: The stomach is circumferentially thick walled and
moderately distended. The antral region is particularly thickened
and collapsed. Suture along the lower gastric body. No small bowel
dilatation. There is a proximal enteroenteric anastomosis. The
proximal colon shows wall thickening and pericolonic edema, greatest
along the proximal transverse segment. Colonic inflammatory changes
are fairly extensive, more compatible with colitis and
diverticulitis. No pneumatosis or pneumoperitoneum. Scattered
colonic and ileal diverticulosis. Possible previous sigmoidectomy.

Vascular/Lymphatic: No acute vascular Find.  No mass or adenopathy.

Peritoneal: No ascites or pneumoperitoneum.

Musculoskeletal: No acute abnormalities. Remote appearing T11
superior endplate compression fracture. Lumbar facet arthropathy
with mild L5-S1 anterolisthesis.
IMPRESSION: 1. Moderately distended stomach with thickened antral region.
Patient has history of chronic impaired gastric emptying and recent
EGD.
2. Signs of gastritis and esophagitis.
3. Colitis with nonspecific pattern.
4. Cystitis, correlate with urinalysis.

## 2018-01-27 IMAGING — CR DG FOOT COMPLETE 3+V*R*
3 series · 3 of 3 positions shown · non-contrast
Comparison: 08/17/2015 and 08/26/2014

CLINICAL DATA: Diabetic ulcer right foot, muscle necrosis, please
evaluate bony changes at distal phalanx right third toe

EXAM:
RIGHT FOOT COMPLETE - 3+ VIEW

[foot ap]
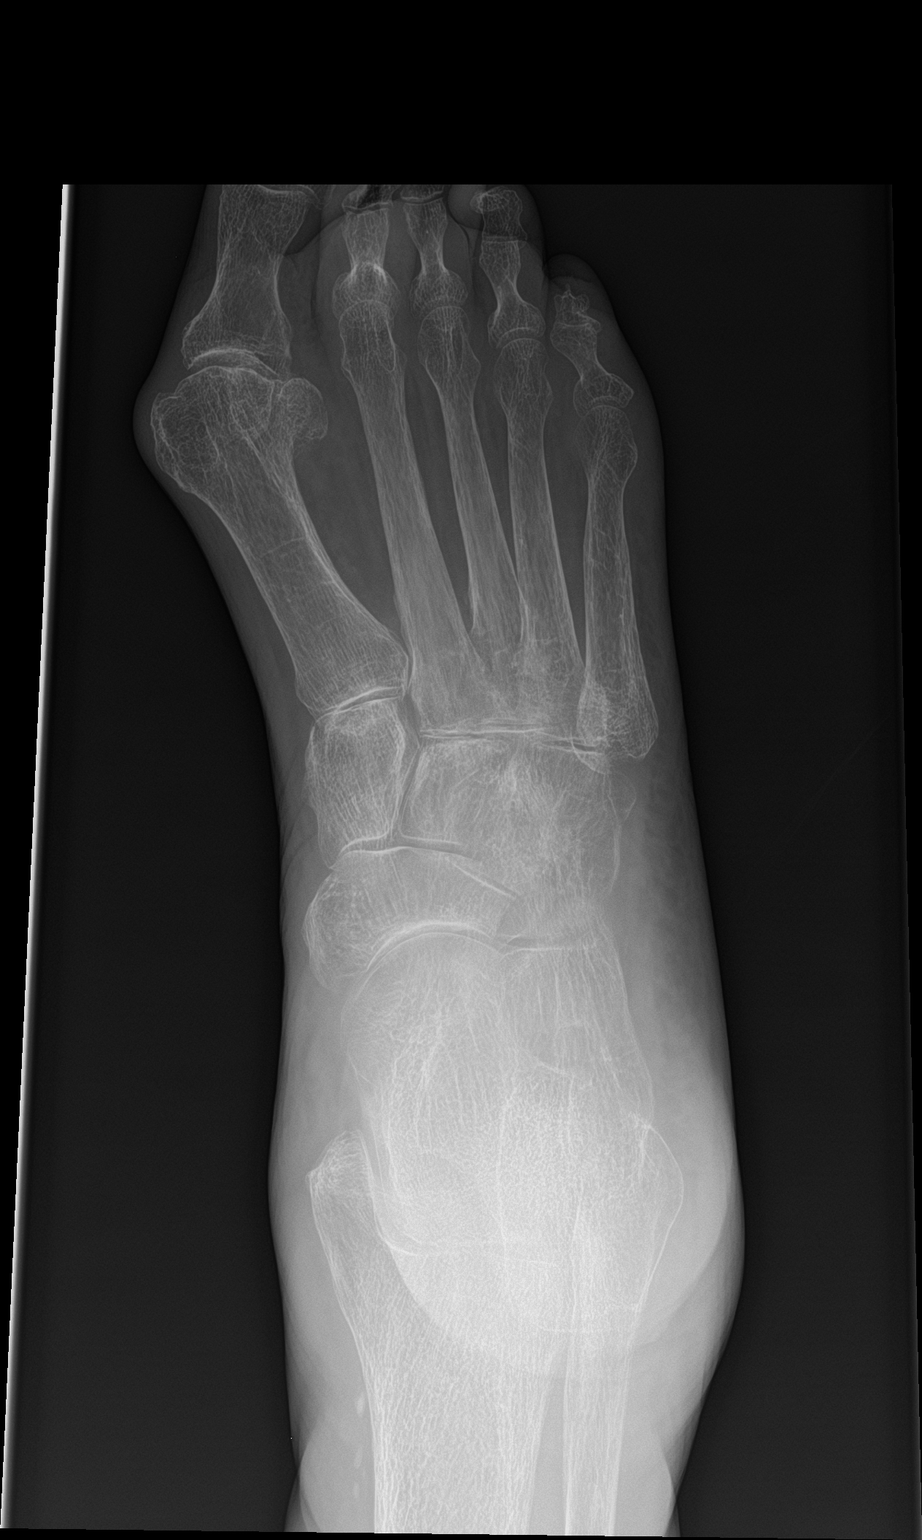

[foot obl]
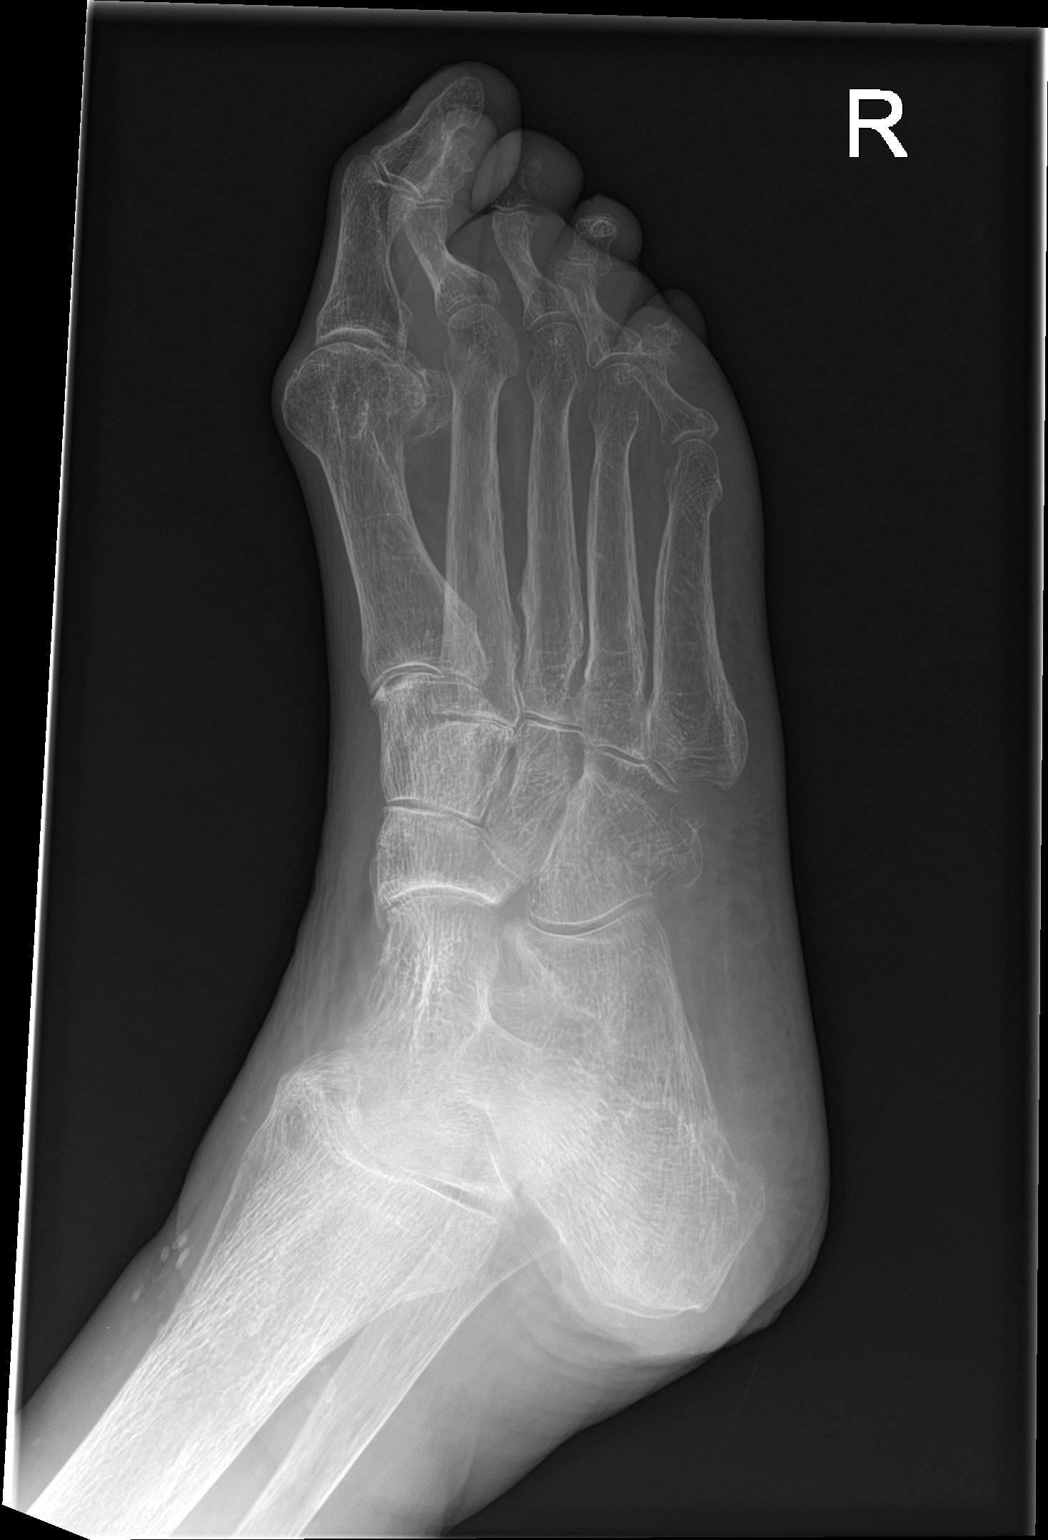

[foot lat]
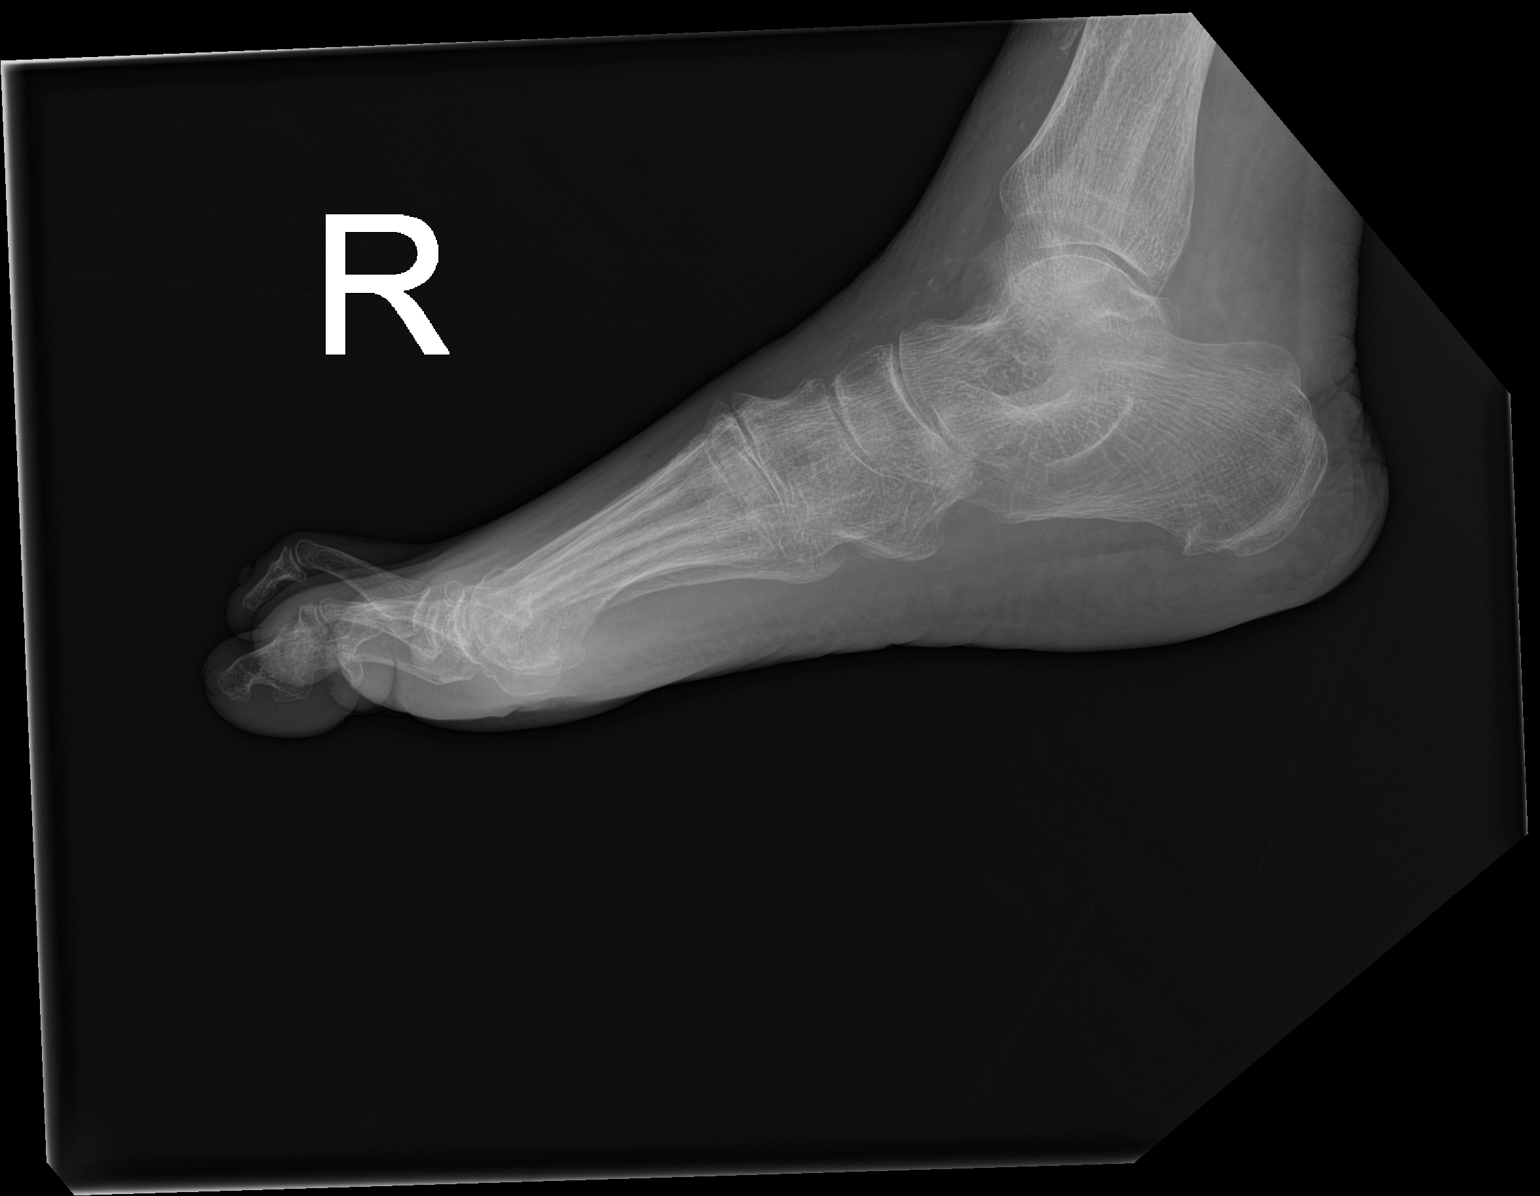

[3 of 3 positions shown; findings below may reference images not displayed]

FINDINGS: Three views of the right foot submitted. No acute fracture or
subluxation. Again noted hallux valgus deformity. There is diffuse
osteopenia. Small plantar spur of calcaneus. Atherosclerotic
vascular calcifications are noted. There is dorsal spurring tarsal
metatarsal joint. Significant soft tissue swelling noted right third
toe. There is osteo lysis and some bony fragmentation at the tip of
distal phalanx third third toe. Osteomyelitis cannot be excluded.
Further correlation with MRI is recommended.
IMPRESSION: No acute fracture or subluxation. Again noted hallux valgus
deformity. There is diffuse osteopenia. Small plantar spur of
calcaneus. Atherosclerotic vascular calcifications are noted. There
is dorsal spurring tarsal metatarsal joint. Significant soft tissue
swelling noted right third toe. There is osteo lysis and some bony
fragmentation at the tip of distal phalanx third third toe.
Osteomyelitis cannot be excluded. Further correlation with MRI is
recommended.

## 2018-01-28 IMAGING — MR MR FOOT*R* W/O CM
4 of 5 series · 26 of 40 positions shown · non-contrast
Comparison: 08/27/2015

CLINICAL DATA: Right third toe diabetic ulcer. Necrosis of muscle.
Fragmentation of the tuft of the distal phalanx third toe. Query
osteomyelitis.

EXAM:
MRI OF THE RIGHT FOREFOOT WITHOUT CONTRAST
TECHNIQUE: Multiplanar, multisequence MR imaging was performed. No intravenous
contrast was administered.

[Series 4: T1 · coronal · 3.0mm · 0.27mm/px · 8 of 43 slices shown (1 of 2)]
[im 1/43]
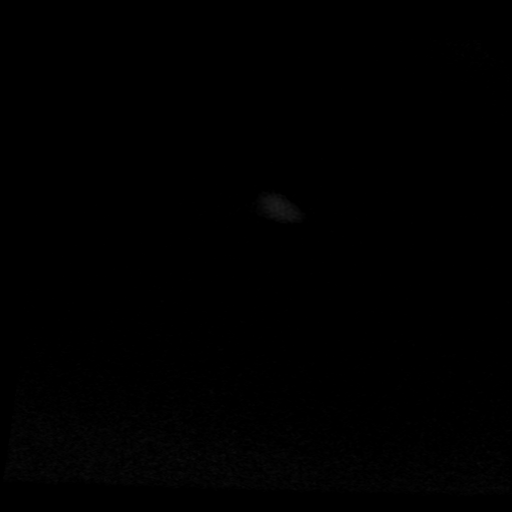
[im 5/43]
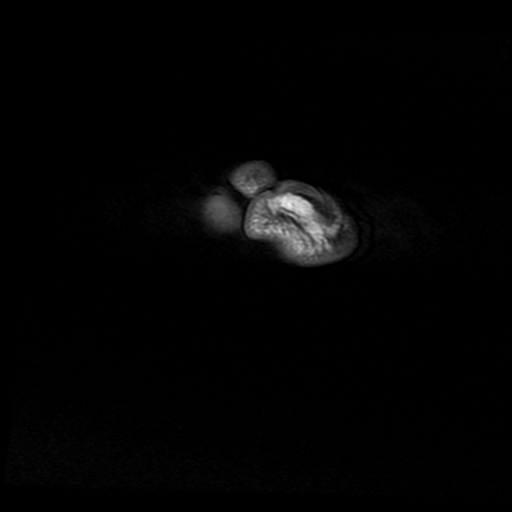
[im 15/43]
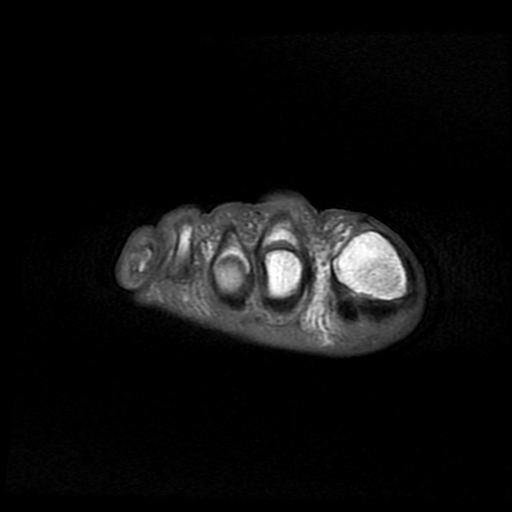
[im 19/43]
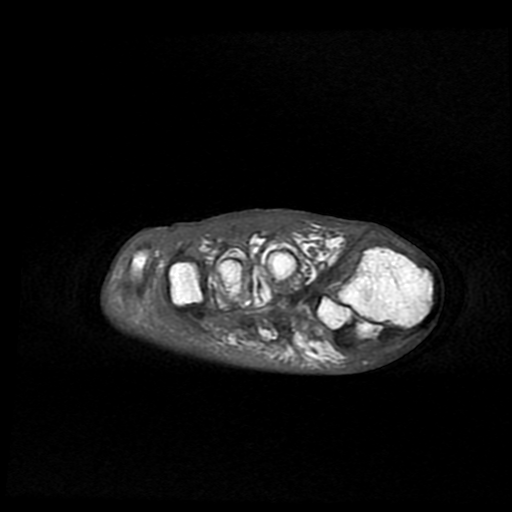
[im 24/43]
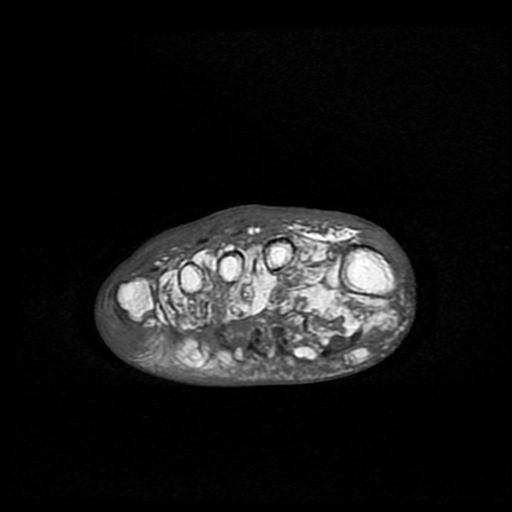
[im 29/43]
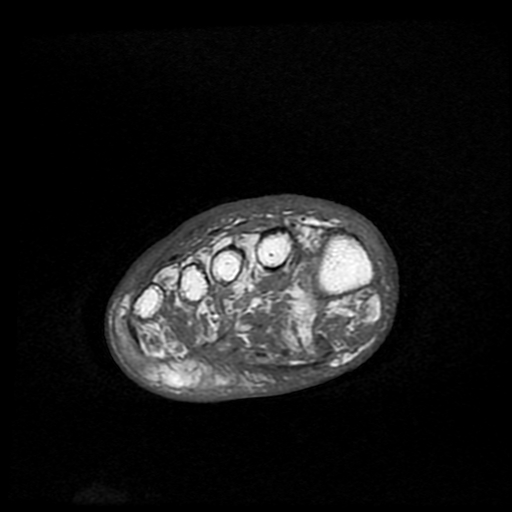
[im 38/43]
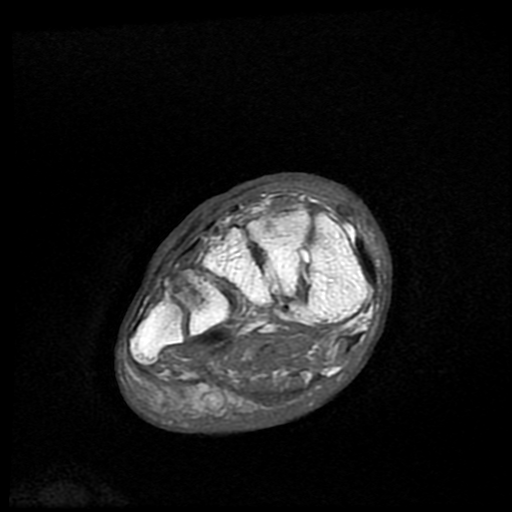
[im 43/43]
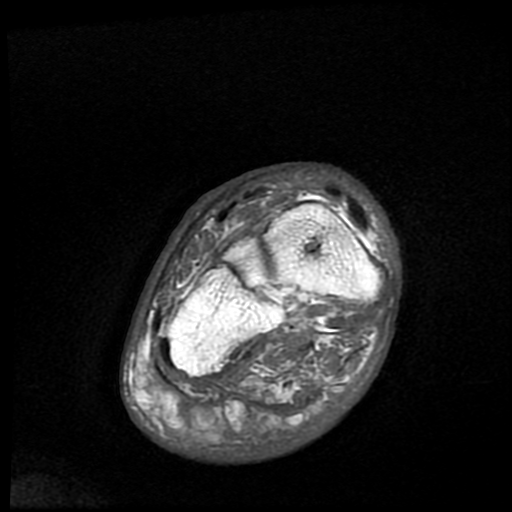

[Series 5: T2 fat-sat · coronal · 3.0mm · 0.55mm/px · 11 of 43 slices shown]
[im 1/43]
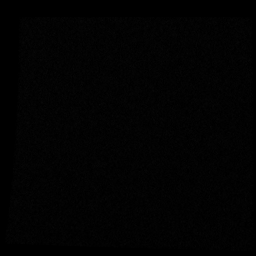
[im 5/43]
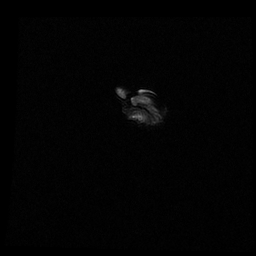
[im 9/43]
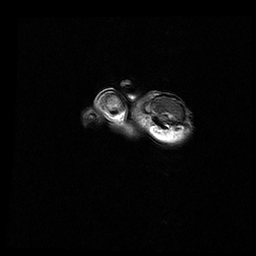
[im 13/43]
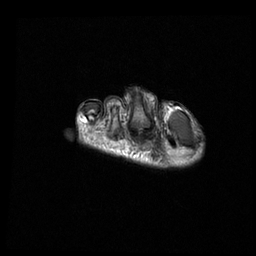
[im 17/43]
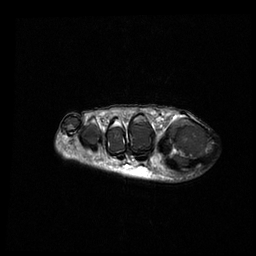
[im 22/43]
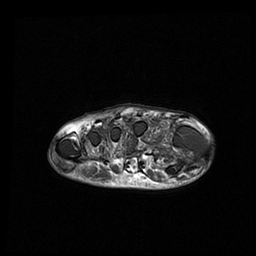
[im 26/43]
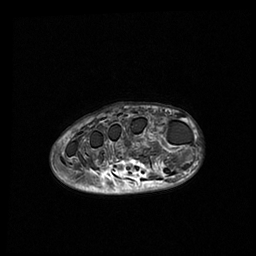
[im 30/43]
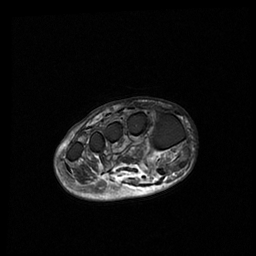
[im 34/43]
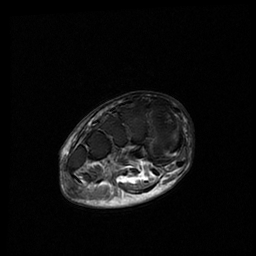
[im 38/43]
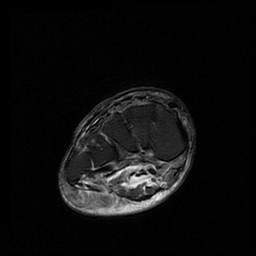
[im 43/43]
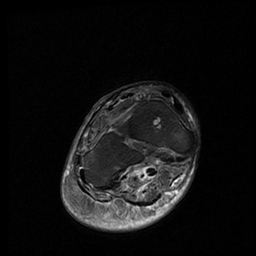

[Series 6: STIR · oblique · 3.0mm · 0.33mm/px · 3 of 22 slices shown]
[im 5/22]
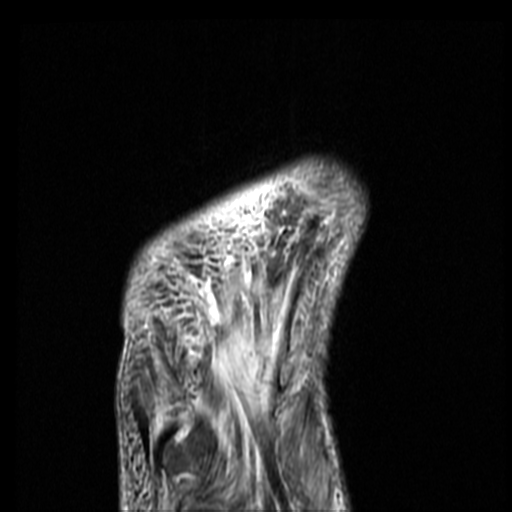
[im 13/22]
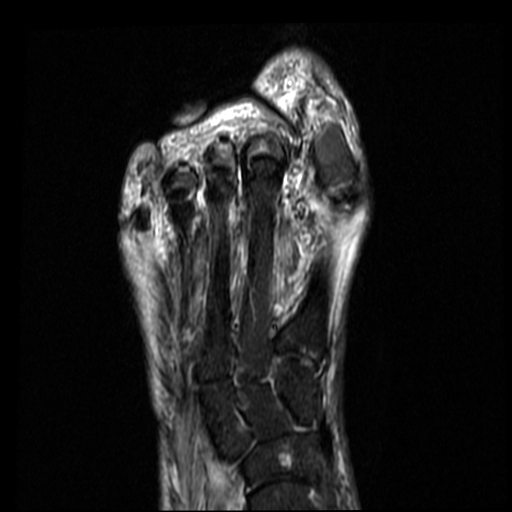
[im 22/22]
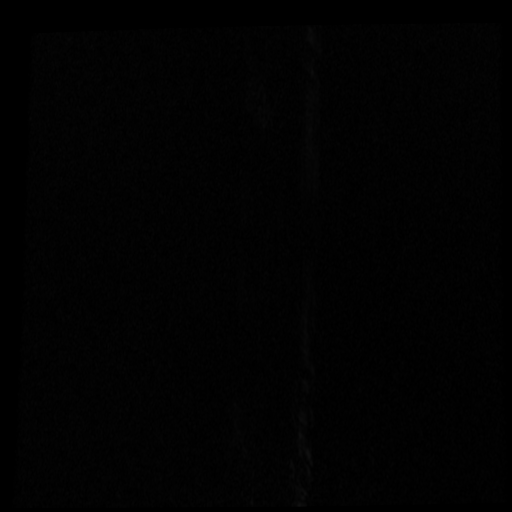

[Series 7: T1 · oblique · 3.0mm · 0.33mm/px · 4 of 22 slices shown (2 of 2)]
[im 1/22]
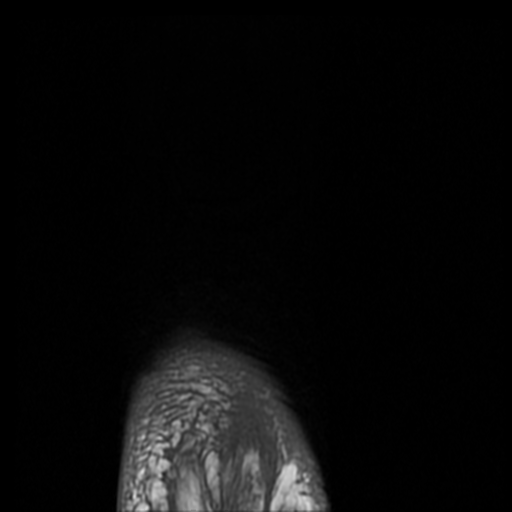
[im 5/22]
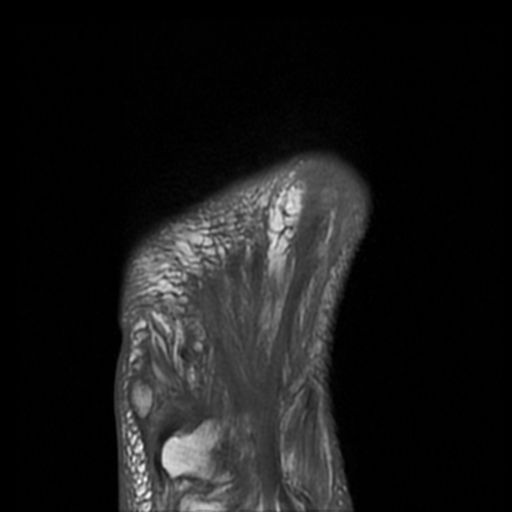
[im 13/22]
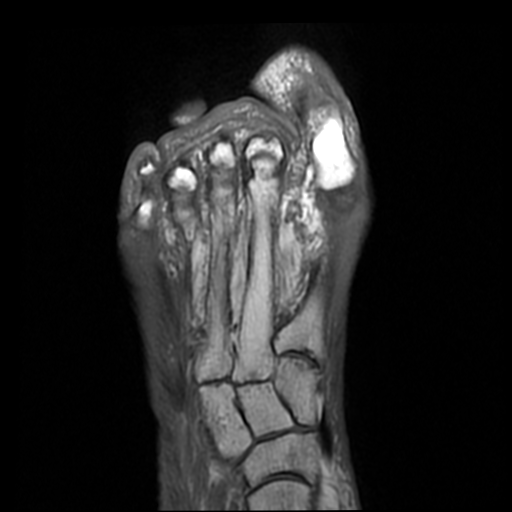
[im 22/22]
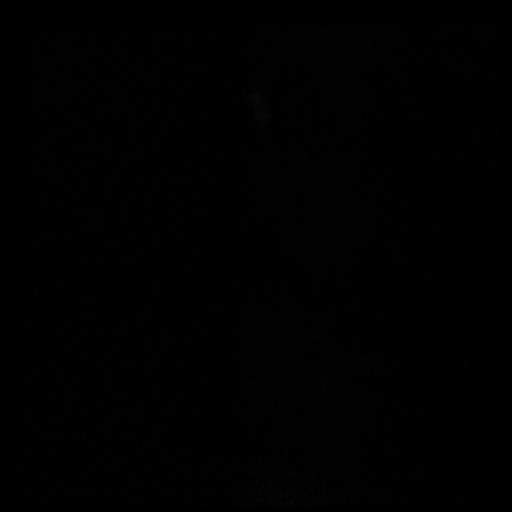

[26 of 40 positions shown; findings below may reference images not displayed]

FINDINGS: Severe edema signal and poor cortical definition in the middle and
distal phalanges of the third toe compatible with acute
osteomyelitis. These 2 phalanges might be fused. There is also
osteomyelitis involving the distal head of the proximal phalanx of
the third toe.

Low-level edema in the shaft of the proximal phalanx second toe is
noted but may be reactive, and is not as fulminant as I would expect
for osteomyelitis.

Lisfranc ligament intact. There is considerable degenerative
arthropathy at the Lisfranc joint. Hallux valgus noted with bony
bunion and degenerative spurring at the first MTP joint. The second
digit is less flexed than the other digits, and accordingly is
dorsally positioned.

Abnormal subcutaneous edema noted along the dorsal and plantar
forefoot. There is low-level edema tracking along the plantar
musculature of the foot.

Patchy degenerative edema at the calcaneocuboid articulation is seen
on the coronal images. Small degenerative subcortical cystic lesion
in the navicular.
IMPRESSION: 1. Osteomyelitis of the phalanges of the third toe, more severely
involving the middle and distal phalanges, but also with some distal
involvement of the proximal phalanx. Surrounding cellulitis in the
toe and also tracking in surrounding toes, the dorsum of the foot,
and plantar foot.
2. Edema tracks along the plantar musculature of the foot and
myositis is not excluded.
3. Degenerative arthropathy of the Lisfranc joint and first MTP
joint. Hallux valgus and bony bunion noted.
4. Subtle edema in the proximal phalanx of the second toe, favor
reactive etiology rather than osteomyelitis of the second toe.

## 2018-02-11 ENCOUNTER — Ambulatory Visit: Payer: Medicare Other | Admitting: Podiatry

## 2018-02-11 IMAGING — CR DG CHEST 2V
2 series · 2 of 2 positions shown · non-contrast
Comparison: Chest radiograph dated 08/26/2015

CLINICAL DATA: 11-year-old female with shortness of breath and
tachypnea

EXAM:
CHEST  2 VIEW

[w chest pa]
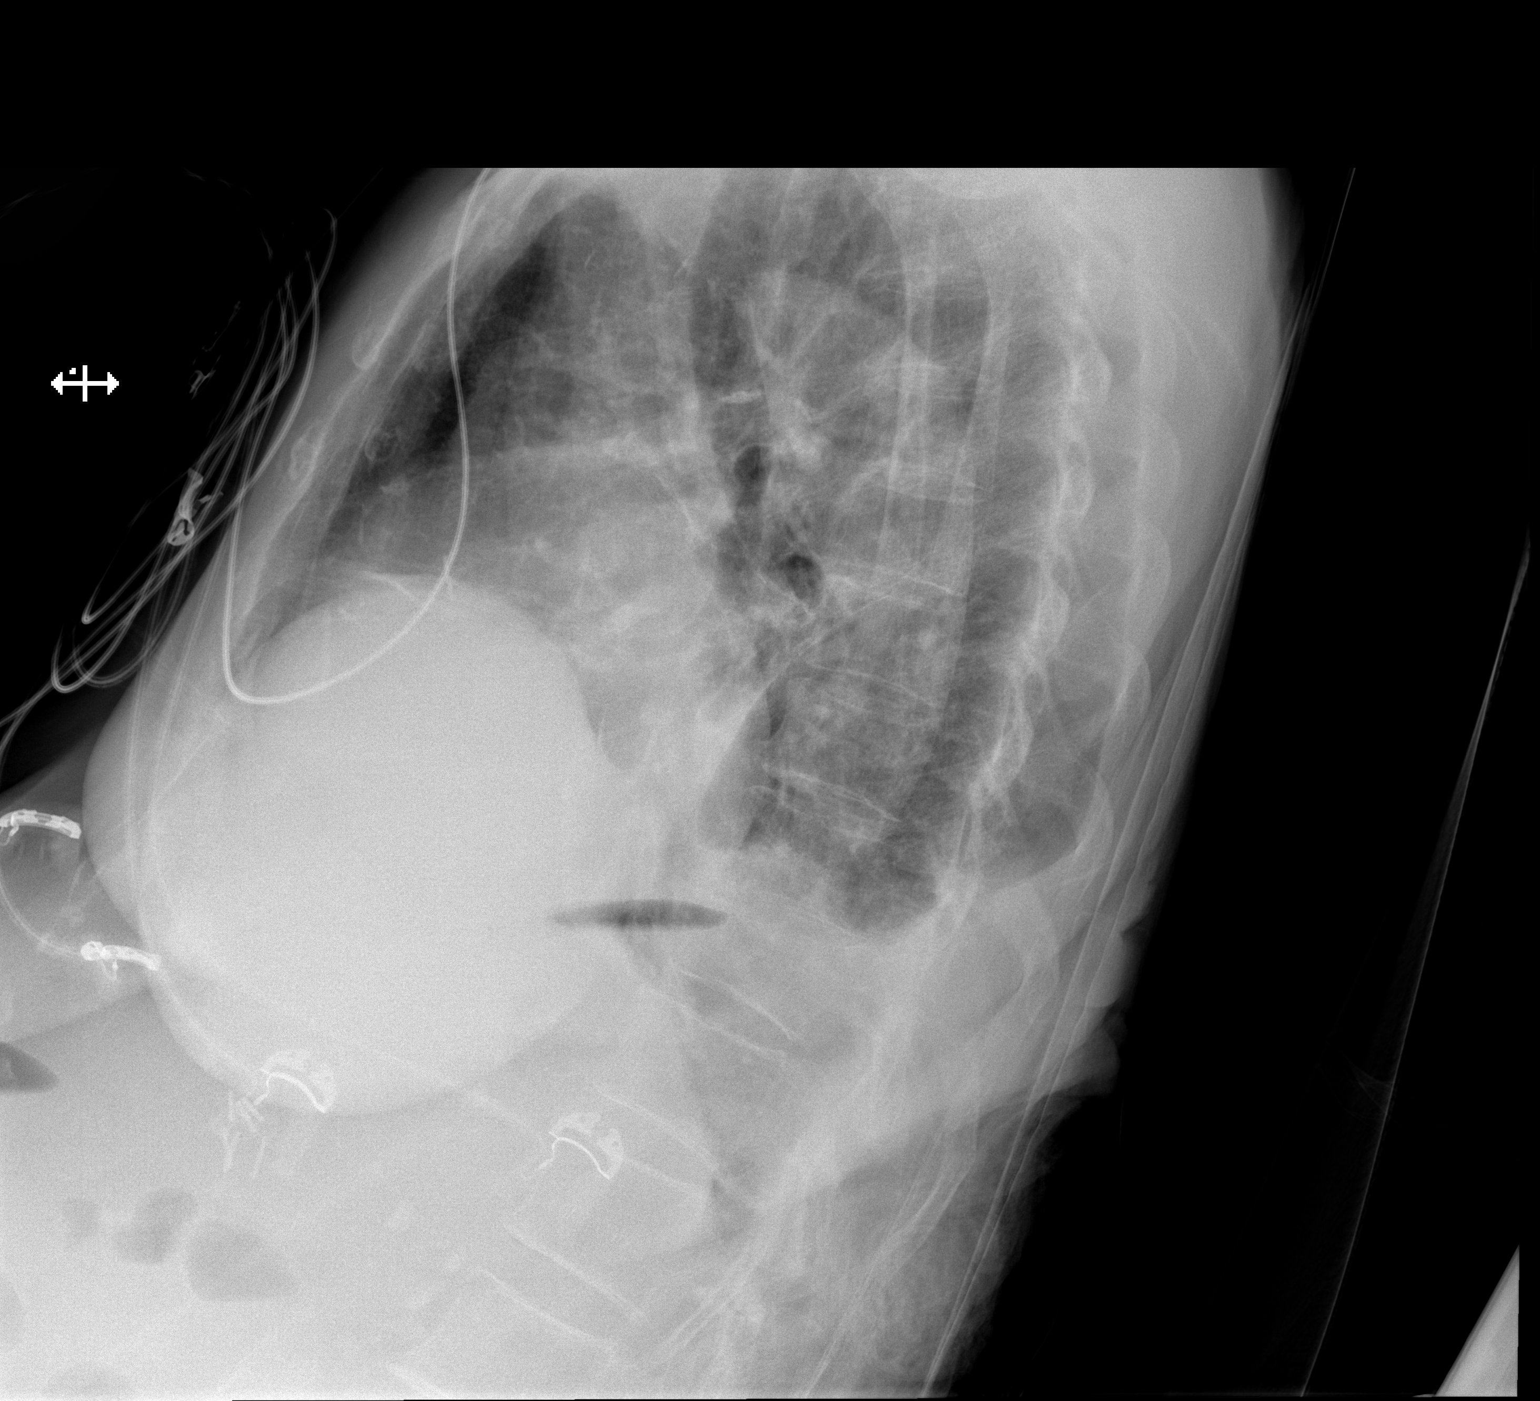

[x chest ap]
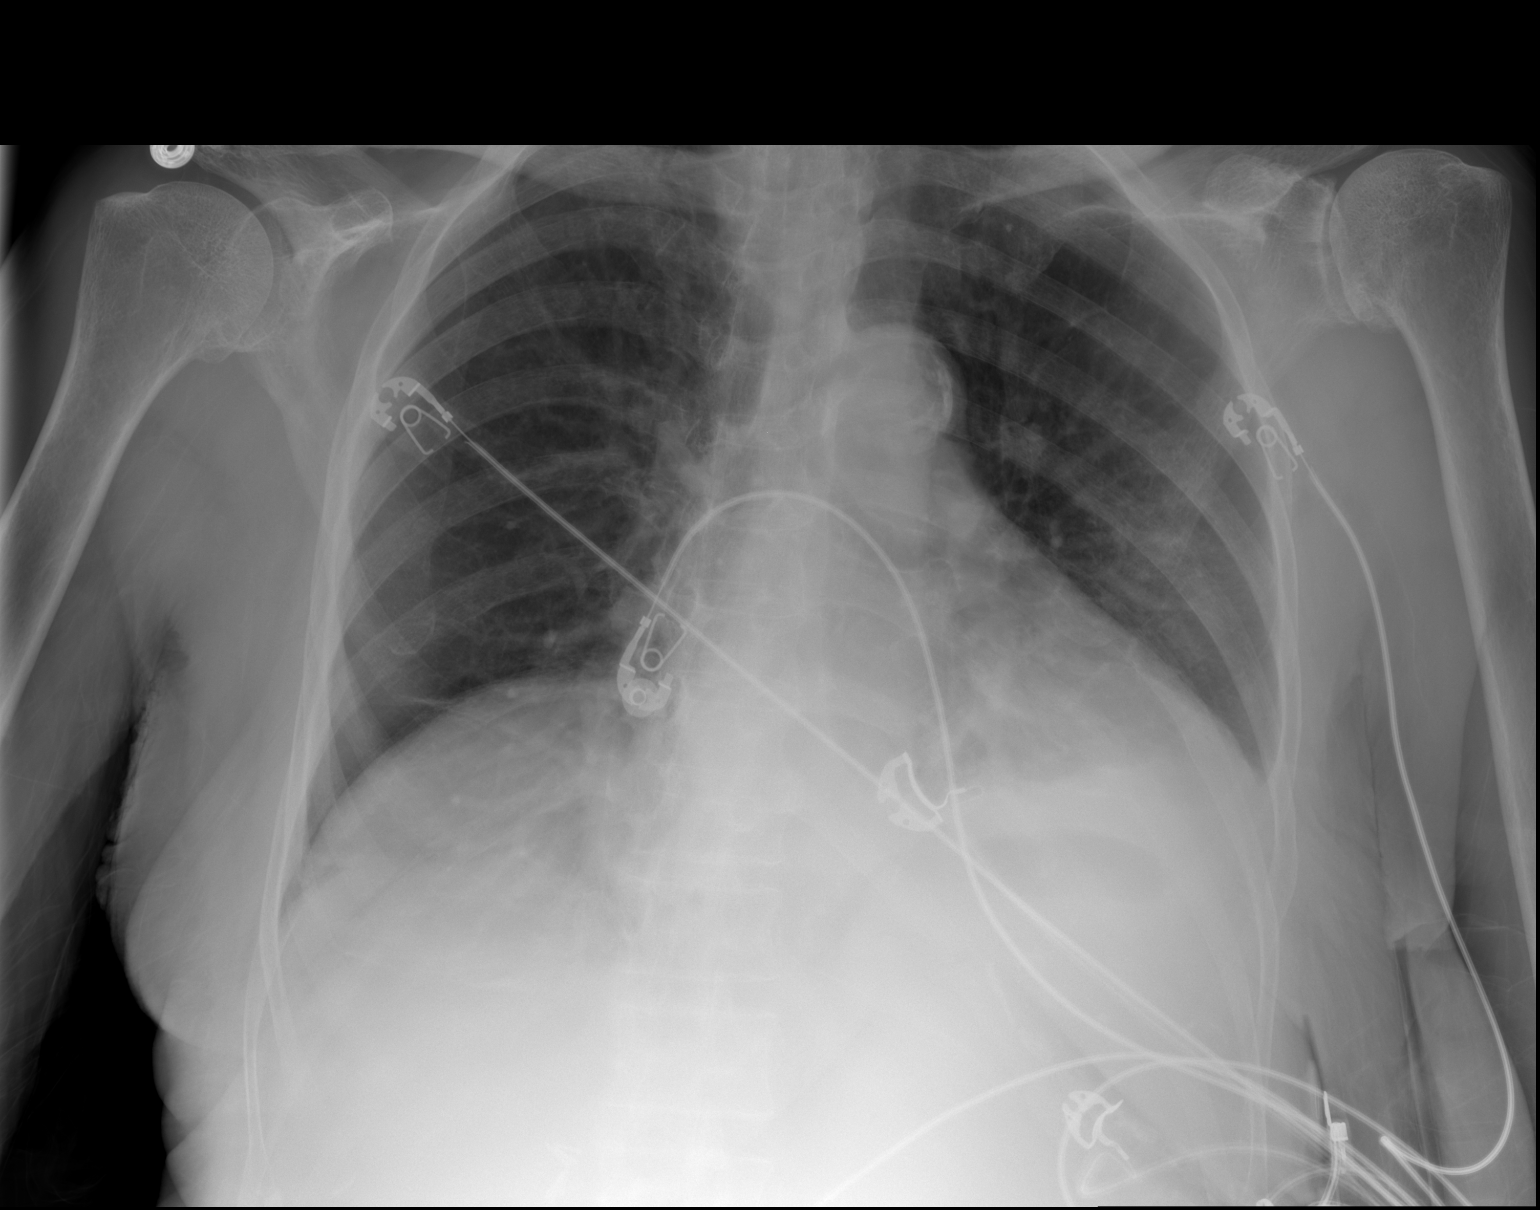

[2 of 2 positions shown; findings below may reference images not displayed]

FINDINGS: Two views of the chest demonstrate a small left pleural effusion,
new from prior study. There is associated partial compressive
atelectasis of the left lung base. Pneumonia is not excluded. The
right lung is clear. There is no pneumothorax. Top-normal cardiac
silhouette. There is degenerative changes of the shoulders. No acute
osseous pathology.
IMPRESSION: Small left pleural effusion with left lung base atelectasis versus
infiltrate.

## 2018-02-11 IMAGING — CT CT ABD-PELV W/ CM
2 of 5 series · 15 of 46 positions shown, 17 images · IV contrast (ISOVUE)
Comparison: CT dated 08/26/2015 is

CLINICAL DATA: [AGE] female with abdominal pain. History of
small-bowel obstruction.

EXAM:
CT ABDOMEN AND PELVIS WITH CONTRAST
TECHNIQUE: Multidetector CT imaging of the abdomen and pelvis was performed
using the standard protocol following bolus administration of
intravenous contrast.
CONTRAST:  80mL WA12WM-M11 IOPAMIDOL (WA12WM-M11) INJECTION 61%

[Series 2: abd/pel with · axial · 0.80mm/px · z∈[-409,-19]mm · 12 of 92 slices shown, 14 images]
[im 7/92  soft-tissue]
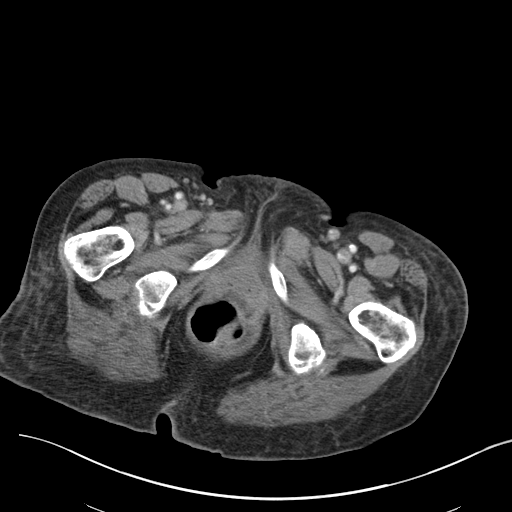
[im 7/92  bone]
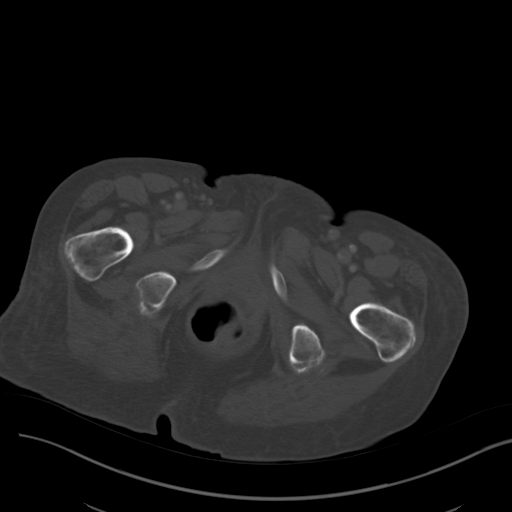
[im 14/92  soft-tissue]
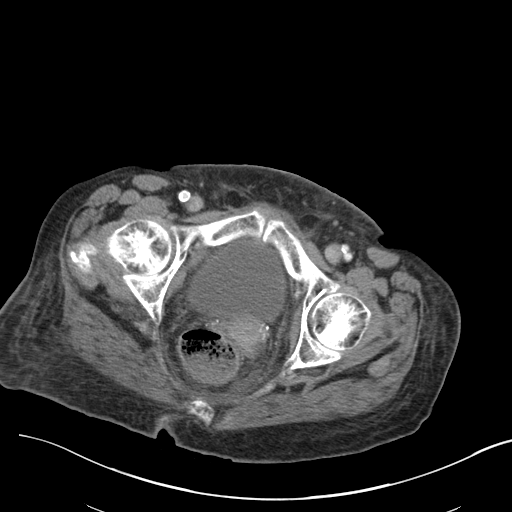
[im 20/92  soft-tissue]
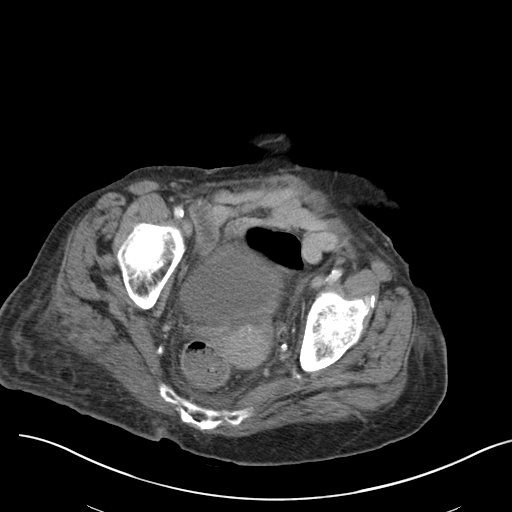
[im 27/92  soft-tissue]
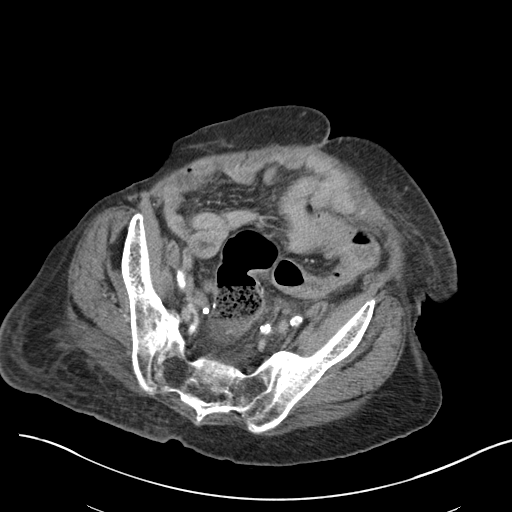
[im 33/92  soft-tissue]
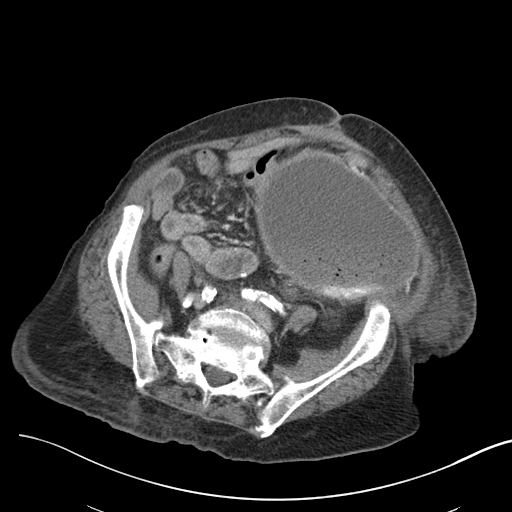
[im 40/92  soft-tissue]
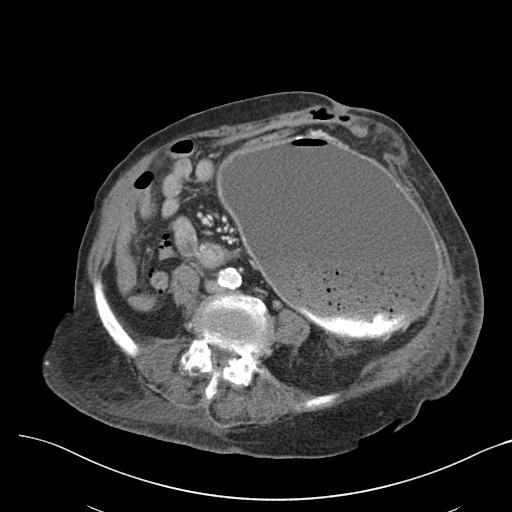
[im 53/92  soft-tissue]
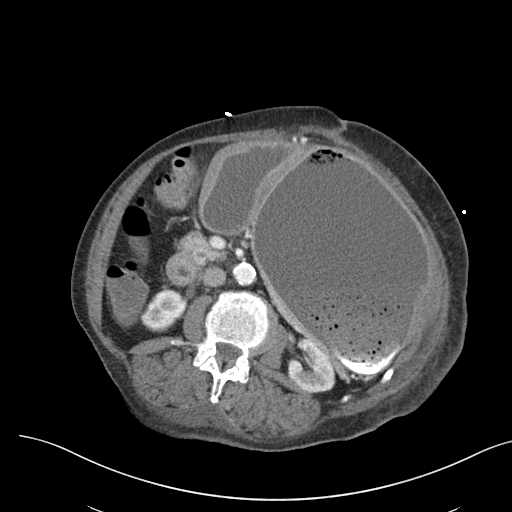
[im 59/92  soft-tissue]
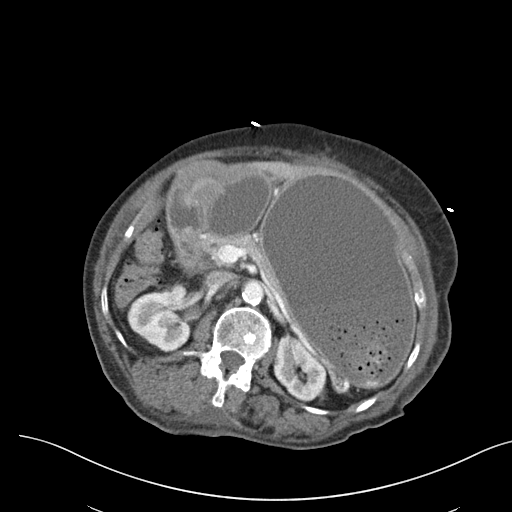
[im 66/92  soft-tissue]
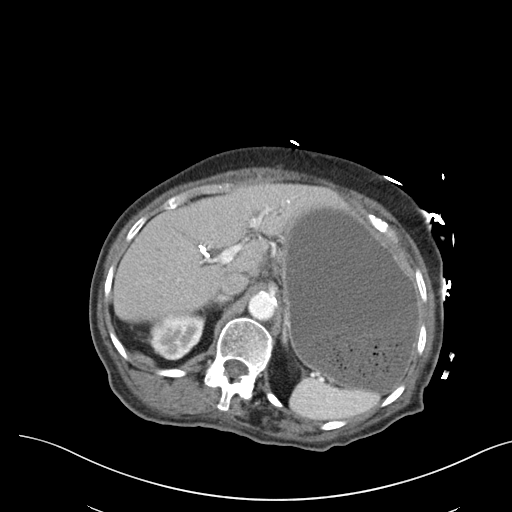
[im 66/92  bone]
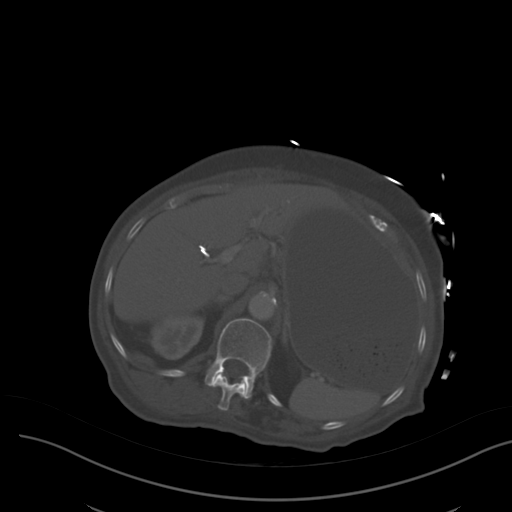
[im 72/92  soft-tissue]
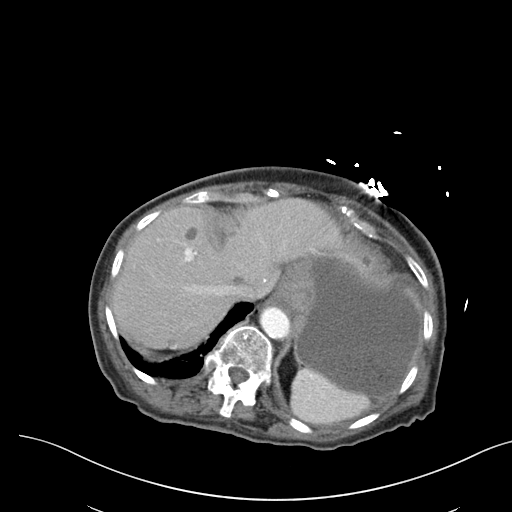
[im 79/92  soft-tissue]
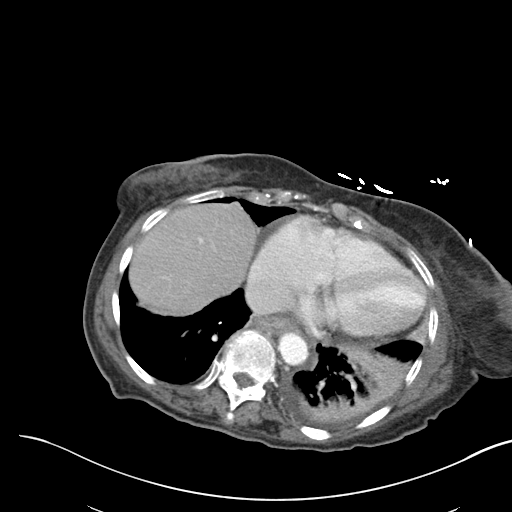
[im 85/92  soft-tissue]
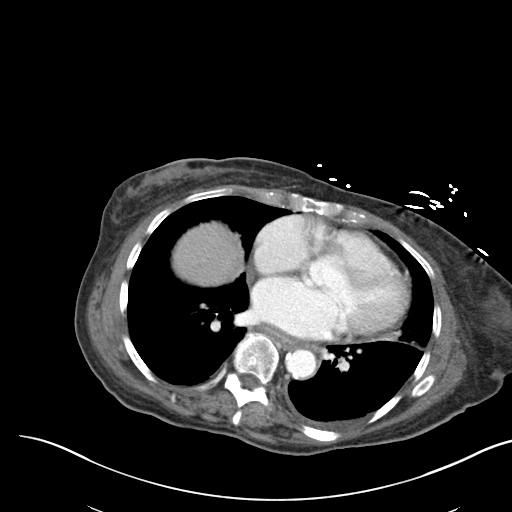

[Series 5: coronal a/|p · coronal · 0.74mm/px · 3 of 153 slices shown]
[im 51/153  soft-tissue]
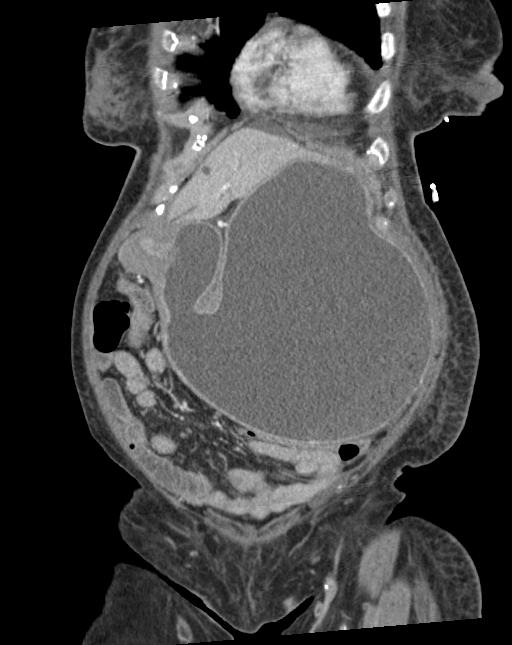
[im 68/153  soft-tissue]
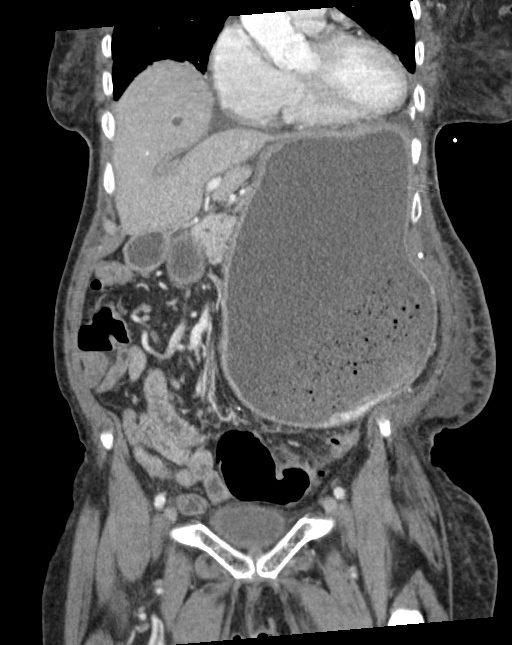
[im 85/153  soft-tissue]
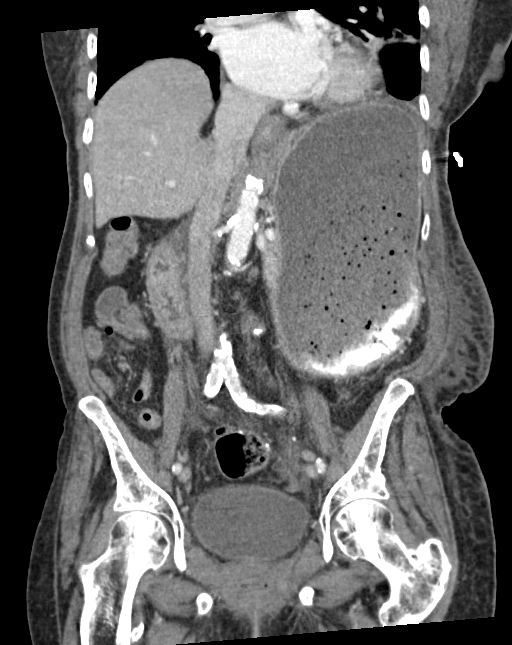

[15 of 46 positions shown; findings below may reference images not displayed]

FINDINGS: There is a partially visualized small left pleural effusion. Patchy
areas of airspace density at the lung bases as well as multiple
ground-glass and nodular density at the left lung base are
concerning for pneumonia. Clinical correlation is recommended. There
is coronary vascular calcification.

No intra-abdominal free air.  No free fluid.

Cholecystectomy. Multiple small scattered hepatic hypodense lesions
of measuring up to 9 mm are not well characterized but likely
represent cysts or hemangioma. Cholecystectomy. The pancreas, and
the adrenal glands appear unremarkable. Subcentimeter splenic
hypodense lesion is not characterized but likely represents a cyst
or hemangioma. There is no hydronephrosis on either side. The
visualized ureters appear grossly unremarkable. The urinary bladder
is partially distended. There is mild haziness of the bladder wall.
Correlation with urinalysis recommended to exclude cystitis. The
uterus is grossly unremarkable.

There is mild thickened appearance of the distal esophagus which may
represent esophagitis or esophageal irritation related to reflux.
The stomach is distended with gastric contents, likely related to
the reported chronically impaired gastric emptying. There is
thickened appearance of the gastric antrum. There is mild haziness
of the proximal duodenum concerning for duodenitis. There is
postsurgical changes of bowel with duodenojejunal anastomosis. There
is no evidence of small-bowel obstruction. There are scattered
sigmoid and colonic diverticula without active inflammatory changes.
There is postsurgical changes of partial sigmoid resection with
anastomotic suture.

There is advanced aortoiliac atherosclerotic disease. The origins of
the celiac axis, SMA, and IMA appear patent. No portal venous gas
identified. There is no adenopathy.

There is midline vertical anterior pelvic wall incisional scar.
There is a 5 cm defect in the anterior peritoneal wall along the
surgical incision with protrusion of a short segment of the colon. A
small fat containing left para incisional hernia noted. There is
diffuse stranding of the subcutaneous fat. No fluid collection or
hematoma. There is osteopenia with multilevel degenerative changes
of the spine. T11 compression deformity similar to prior study. No
acute fracture.
IMPRESSION: Small left pleural effusion with findings concerning for bibasilar
pneumonia. Clinical correlation and follow-up recommended.

Severe gastric distention likely related to chronic gastric
dysmotility or partial gastric outlet obstruction. Clinical
correlation is recommended. There has been interval increase in the
gastric distension compared the prior CT.

Mild haziness of the proximal duodenum concerning for duodenitis. No
evidence of small-bowel obstruction.

Esophagitis.

Colonic diverticulosis without active inflammatory changes.

## 2018-02-13 ENCOUNTER — Ambulatory Visit (INDEPENDENT_AMBULATORY_CARE_PROVIDER_SITE_OTHER): Payer: Medicare Other | Admitting: Podiatry

## 2018-02-13 DIAGNOSIS — E0859 Diabetes mellitus due to underlying condition with other circulatory complications: Secondary | ICD-10-CM | POA: Diagnosis not present

## 2018-02-13 DIAGNOSIS — I739 Peripheral vascular disease, unspecified: Secondary | ICD-10-CM | POA: Diagnosis not present

## 2018-02-13 DIAGNOSIS — L97512 Non-pressure chronic ulcer of other part of right foot with fat layer exposed: Secondary | ICD-10-CM

## 2018-02-13 DIAGNOSIS — I70235 Atherosclerosis of native arteries of right leg with ulceration of other part of foot: Secondary | ICD-10-CM | POA: Diagnosis not present

## 2018-02-13 DIAGNOSIS — L989 Disorder of the skin and subcutaneous tissue, unspecified: Secondary | ICD-10-CM

## 2018-02-17 NOTE — Progress Notes (Signed)
Subjective:  84 year old female presenting today for follow up evaluation of ulcerations to the right foot. She states the wound on the right hallux looks improved. She reports increased pain of the left bunion. She has been applying Gentamicin cream to the wound for treatment. There are no modifying factors noted. Patient is here for further evaluation and treatment.   Past Medical History:  Diagnosis Date  . Acute respiratory failure ([place])   . Anemia    previous blood transfusions  . Arthritis    "all over"  . Asthma   . Bradycardia    requiring previous d/c of BB and reduction of amiodarone  . CAD (coronary artery disease)    nonobstructive per notes  . Chronic diastolic CHF (congestive heart failure) ([place])   . CKD (chronic kidney disease), stage III ([place])   . Complication of blood transfusion    "got the wrong blood type at [facility] in ~ 2015; no adverse reaction that we are aware of"/daughter, [name] (01/27/2016)  . COPD (chronic obstructive pulmonary disease) ([place])   . Depression    "light case"  . DVT (deep venous thrombosis) ([place]) 01/2016   a. LLE DVT 01/2016 - switched from Eliquis to Coumadin.  . Gastric stenosis    a. s/p stomach tube  . GERD (gastroesophageal reflux disease)   . History of blood transfusion    "several" (01/27/2016)  . History of stomach ulcers   . Hyperlipidemia   . Hypertension   . Hypothyroidism   . On home oxygen therapy    "2L; 9PM til 9AM" (06/27/2017)  . Paraesophageal hernia   . Perforated gastric ulcer ([place])   . Pneumonia    "a few times" (06/27/2017)  . Seasonal allergies   . SIADH (syndrome of inappropriate ADH production) ([place])    [name] 01/10/2015  . Small bowel obstruction ([place])    "I don't know how many" (01/11/2015)  . Stroke ([place])    "light one"  . Type II diabetes mellitus ([place])    "related to prednisone use  for > 20 yr; once predinose stopped; no more DM RX" (01/27/2016)  . UTI (urinary tract infection) 02/08/2016  .  Ventral hernia with bowel obstruction       Objective/Physical Exam General: The patient is alert and oriented x3 in no acute distress.  Dermatology:  Wound #1 noted to the right hallux measuring 1.0 x 0.9 x 0.2 cm (LxWxD).   To the noted ulceration(s), there is no eschar. There is a moderate amount of slough, fibrin, and necrotic tissue noted. Granulation tissue and wound base is red. There is a minimal amount of serosanguineous drainage noted.  There does appear to be some exposed bone to both ulceration sites.  There is no malodor. Periwound integrity is intact.  Wound noted to the right fourth toe has healed. Complete re-epithelialization has occurred. No drainage noted.  Hyperkeratotic lesions present on the bilateral feet x 2. Pain on palpation with a central nucleated core noted.   Vascular: Nonpalpable pedal pulses bilaterally. No edema or erythema noted.   Neurological: Epicritic and protective threshold diminished bilaterally.   Musculoskeletal Exam: Range of motion within normal limits to all pedal and ankle joints bilateral. Muscle strength 5/5 in all groups bilateral.   Assessment: #1 ulceration of the right hallux secondary to diabetes mellitus  #2 ulceration of the right fourth toe secondary to diabetes mellitus - healed  #3 diabetes mellitus w/ peripheral neuropathy   Plan of Care:  #1 Patient  was evaluated.  #2 Medically necessary excisional debridement including subcutaneous tissue was performed using a tissue nipper and a chisel blade. Excisional debridement of all the necrotic nonviable tissue down to healthy bleeding viable tissue was performed with post-debridement measurements same as pre-. #3 The wound was cleansed and dry sterile dressing applied. #4 Continue using Gentamicin cream daily.  #5 Excisional debridement of keratoic lesions using a chisel blade was performed without incident. Light dressing applied.  #6 Return to clinic in 4 weeks.    Edrick Kins, DPM [facility]  Dr. Edrick Kins, [address]                                        [place], [place] [address]                Office [phone]  Fax [phone]

## 2018-02-20 ENCOUNTER — Ambulatory Visit (INDEPENDENT_AMBULATORY_CARE_PROVIDER_SITE_OTHER): Payer: Medicare Other | Admitting: Pharmacist

## 2018-02-20 DIAGNOSIS — Z5181 Encounter for therapeutic drug level monitoring: Secondary | ICD-10-CM

## 2018-02-20 DIAGNOSIS — I4892 Unspecified atrial flutter: Secondary | ICD-10-CM | POA: Diagnosis not present

## 2018-02-20 LAB — POCT INR: INR: 2.1 (ref 2.0–3.0)

## 2018-02-20 NOTE — Patient Instructions (Signed)
Description   Continue on same dosage 2 tablets daily except 1 tablet on Fridays.  Recheck in 4 weeks.  Call Coumadin Clinic with any new medications [phone].

## 2018-02-22 IMAGING — RF DG UGI W/ GASTROGRAFIN
6 of 8 series · 12 of 15 positions shown · IV contrast (agent unspecified)
Comparison: CT 09/11/2015

CLINICAL DATA: Gastric outlet obstruction, pyloric stenosis

EXAM:
WATER SOLUBLE UPPER GI SERIES
TECHNIQUE: Single-column upper GI series was performed using water soluble
contrast.
CONTRAST:  60 mL cc of water-soluble contrast

[Series 1: cp_standard · 0.53mm/px · 3 of 62 frames shown]
[frame 10/62]
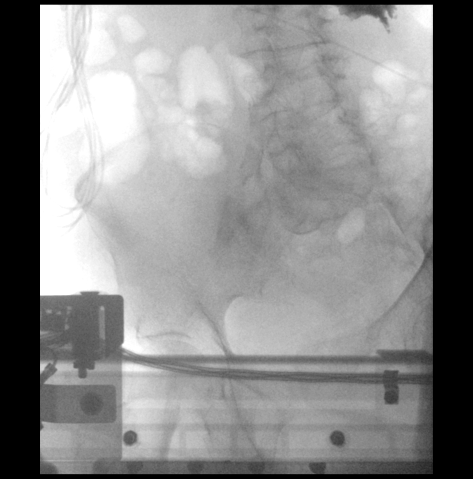
[frame 32/62]
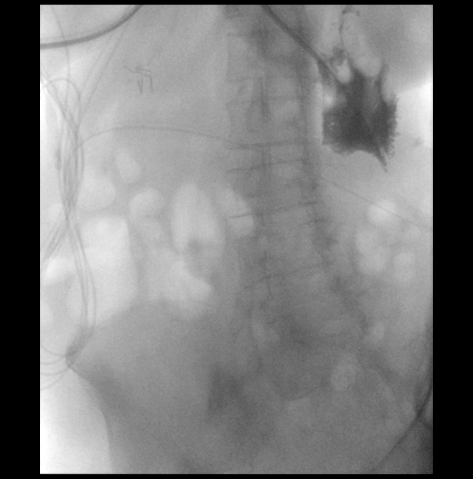
[frame 53/62]
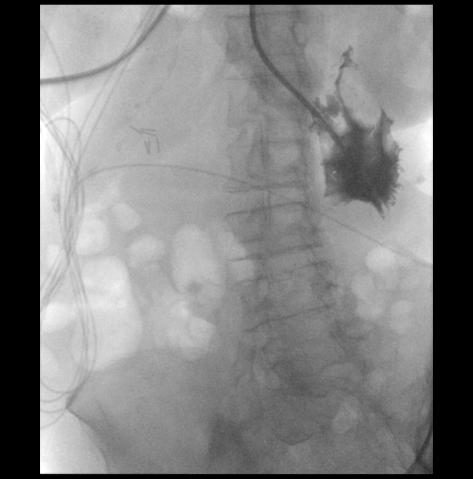

[Series 2: fluoro_barium 2fps_bw · 0.17mm/px · 1 of 1 slices shown (1 of 5)]
[im 1/1]
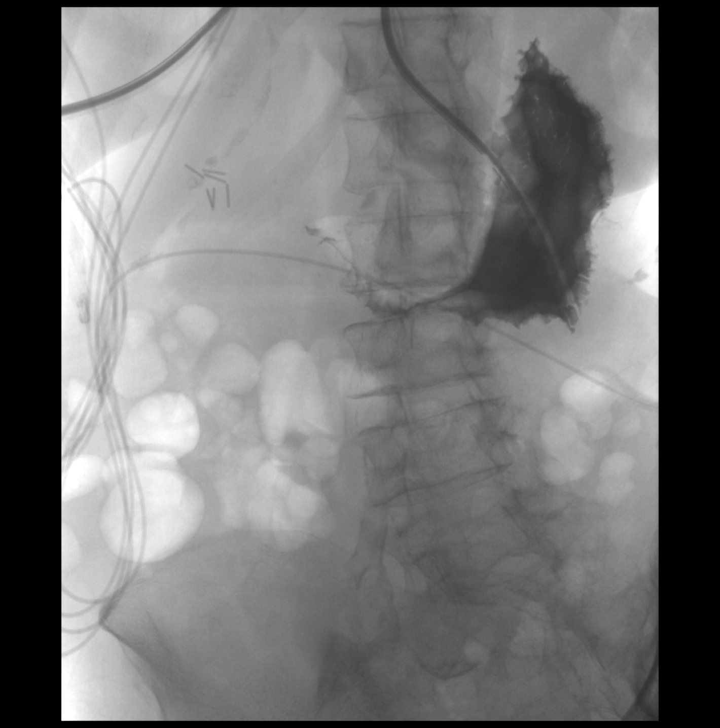

[Series 3: fluoro_barium 2fps_bw · 0.17mm/px · 2 of 2 frames shown (2 of 5)]
[frame 1/2]
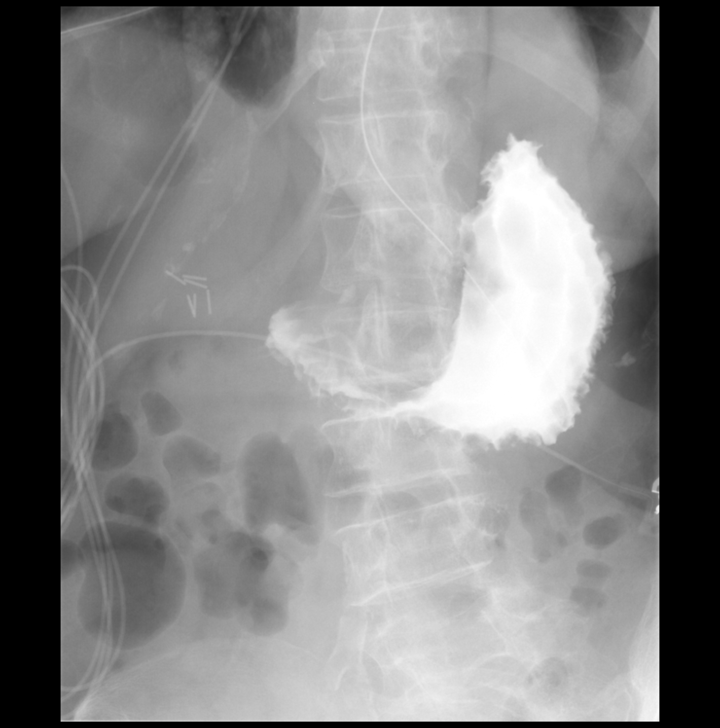
[frame 2/2]
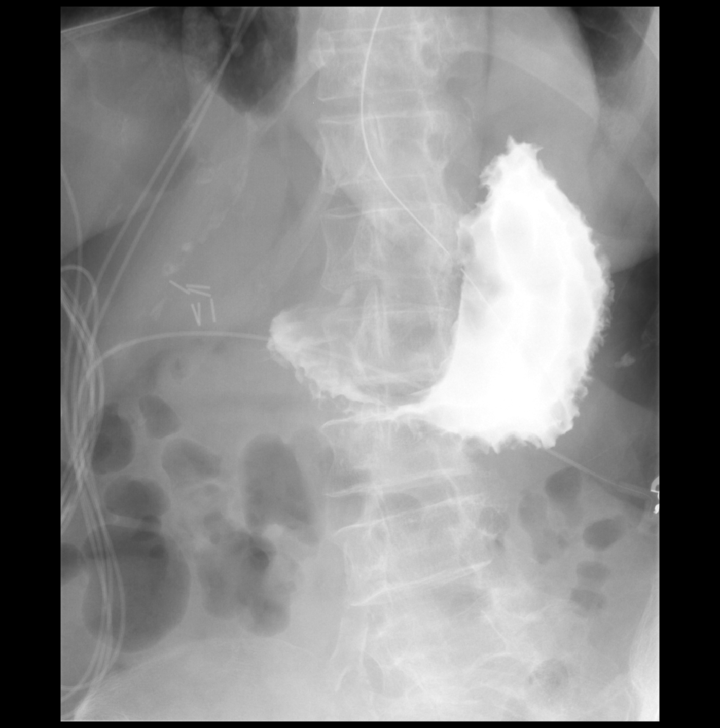

[Series 5: fluoro_barium 2fps_bw · 0.18mm/px · 2 of 2 frames shown (3 of 5)]
[frame 1/2]
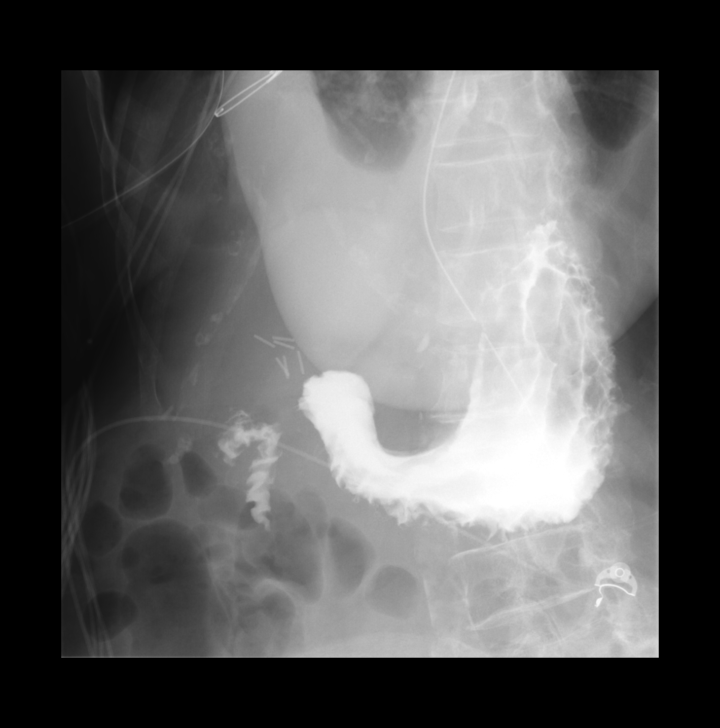
[frame 2/2]
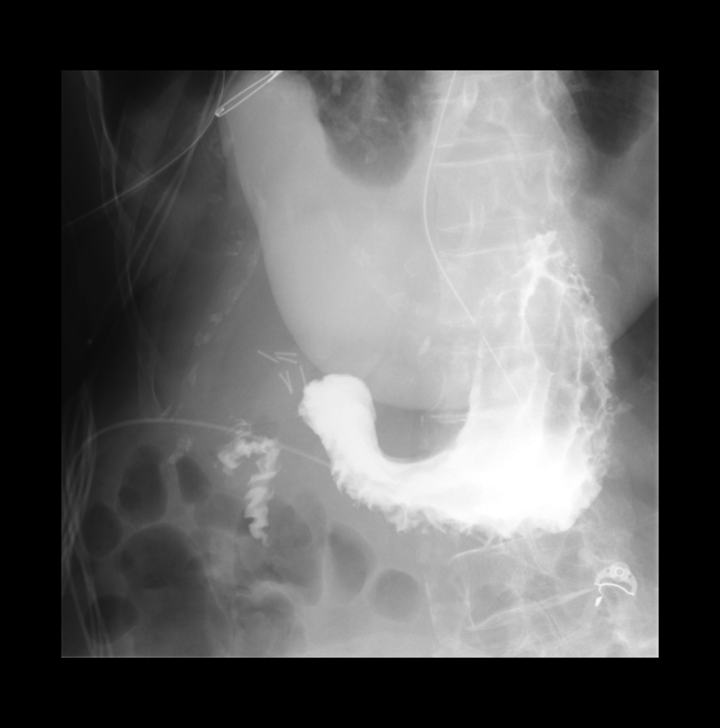

[Series 6: fluoro_barium 2fps_bw · 0.18mm/px · 2 of 2 frames shown (4 of 5)]
[frame 1/2]
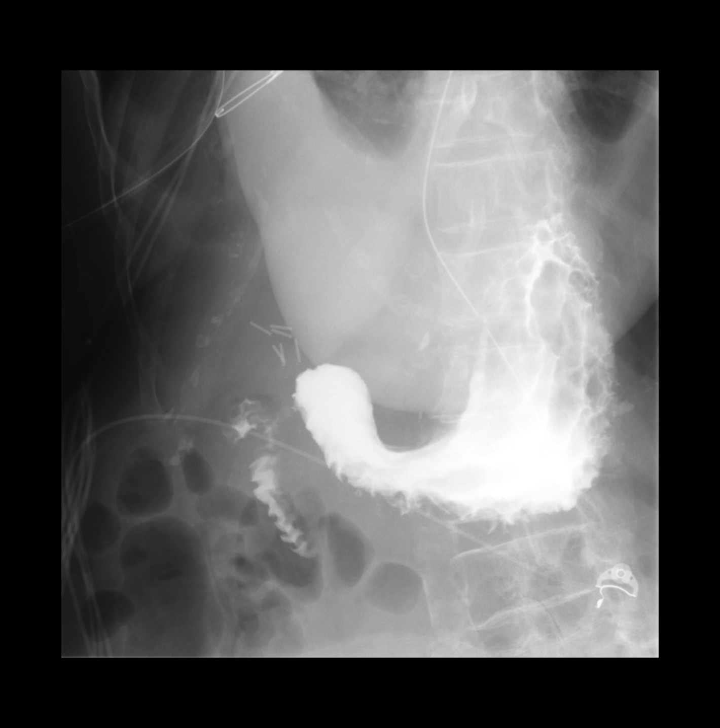
[frame 2/2]
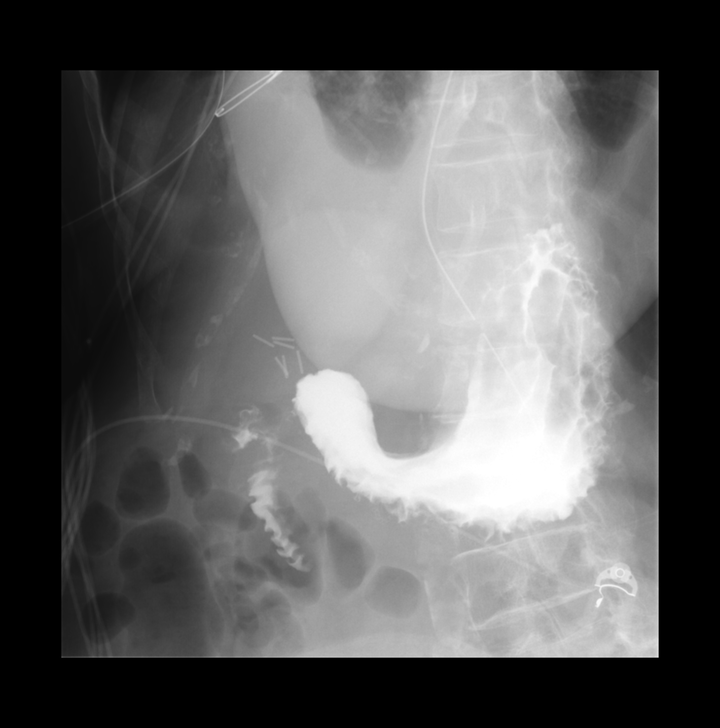

[Series 8: fluoro_barium 2fps_bw · 0.18mm/px · 2 of 2 frames shown (5 of 5)]
[frame 1/2]
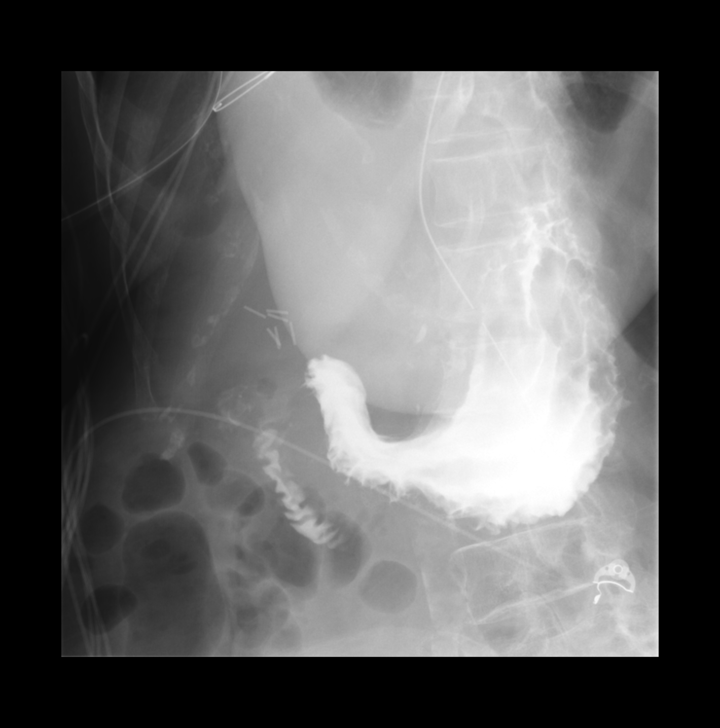
[frame 2/2]
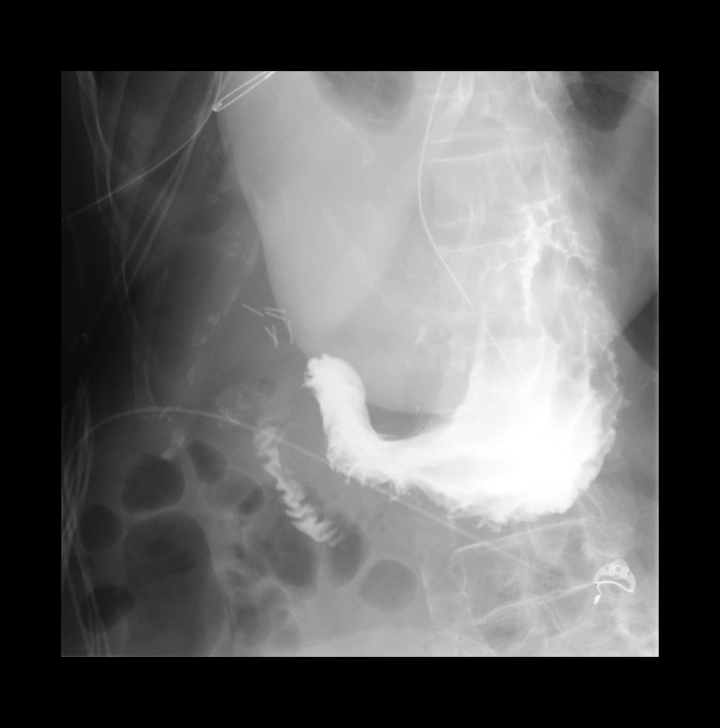

[12 of 15 positions shown; findings below may reference images not displayed]

FLUOROSCOPY TIME:  Radiation Exposure Index (as provided by the
fluoroscopic device): 33.7 mGy

If the device does not provide the exposure index:

Fluoroscopy Time (in minutes and seconds):

Number of Acquired Images:
FINDINGS: 60 cc of water-soluble contrast and 50 cc of water was hand injected
into the stomach via the NG tube. The contrast fills the stomach
which is nondistended. Persistent narrowing at the gastric pylorus.
Small trickle of contrast does pass through the duodenum.
IMPRESSION: No evidence of high-grade obstruction. Stenosis through the pyloric
region of stomach.

## 2018-02-27 ENCOUNTER — Other Ambulatory Visit: Payer: Self-pay | Admitting: *Deleted

## 2018-02-27 MED ORDER — FLUTICASONE PROPIONATE 50 MCG/ACT NA SUSP: NASAL | 3 refills | Status: DC

## 2018-02-27 NOTE — Telephone Encounter (Signed)
[facility]

## 2018-03-04 ENCOUNTER — Other Ambulatory Visit: Payer: Self-pay | Admitting: Cardiology

## 2018-03-13 ENCOUNTER — Ambulatory Visit (INDEPENDENT_AMBULATORY_CARE_PROVIDER_SITE_OTHER): Payer: Medicare Other | Admitting: Podiatry

## 2018-03-13 ENCOUNTER — Encounter: Payer: Self-pay | Admitting: Podiatry

## 2018-03-13 DIAGNOSIS — L97512 Non-pressure chronic ulcer of other part of right foot with fat layer exposed: Secondary | ICD-10-CM | POA: Diagnosis not present

## 2018-03-13 DIAGNOSIS — I739 Peripheral vascular disease, unspecified: Secondary | ICD-10-CM

## 2018-03-13 DIAGNOSIS — I70235 Atherosclerosis of native arteries of right leg with ulceration of other part of foot: Secondary | ICD-10-CM

## 2018-03-13 DIAGNOSIS — E0859 Diabetes mellitus due to underlying condition with other circulatory complications: Secondary | ICD-10-CM

## 2018-03-13 MED ORDER — DOXYCYCLINE HYCLATE 100 MG PO TABS: 100.0000 mg | ORAL_TABLET | Freq: Two times a day (BID) | ORAL | 0 refills | Status: DC

## 2018-03-17 NOTE — Progress Notes (Signed)
Subjective:  84 year old female presenting today for follow up evaluation of an ulceration to the right hallux. She states she is doing well. She denies any modifying factors. She has been wearing the post op shoe as directed. Patient is here for further evaluation and treatment.   Past Medical History:  Diagnosis Date  . Acute respiratory failure ([place])   . Anemia    previous blood transfusions  . Arthritis    "all over"  . Asthma   . Bradycardia    requiring previous d/c of BB and reduction of amiodarone  . CAD (coronary artery disease)    nonobstructive per notes  . Chronic diastolic CHF (congestive heart failure) ([place])   . CKD (chronic kidney disease), stage III ([place])   . Complication of blood transfusion    "got the wrong blood type at [facility] in ~ 2015; no adverse reaction that we are aware of"/daughter, [name] (01/27/2016)  . COPD (chronic obstructive pulmonary disease) ([place])   . Depression    "light case"  . DVT (deep venous thrombosis) ([place]) 01/2016   a. LLE DVT 01/2016 - switched from Eliquis to Coumadin.  . Gastric stenosis    a. s/p stomach tube  . GERD (gastroesophageal reflux disease)   . History of blood transfusion    "several" (01/27/2016)  . History of stomach ulcers   . Hyperlipidemia   . Hypertension   . Hypothyroidism   . On home oxygen therapy    "2L; 9PM til 9AM" (06/27/2017)  . Paraesophageal hernia   . Perforated gastric ulcer ([place])   . Pneumonia    "a few times" (06/27/2017)  . Seasonal allergies   . SIADH (syndrome of inappropriate ADH production) ([place])    [name] 01/10/2015  . Small bowel obstruction ([place])    "I don't know how many" (01/11/2015)  . Stroke ([place])    "light one"  . Type II diabetes mellitus ([place])    "related to prednisone use  for > 20 yr; once predinose stopped; no more DM RX" (01/27/2016)  . UTI (urinary tract infection) 02/08/2016  . Ventral hernia with bowel obstruction       Objective/Physical Exam General: The  patient is alert and oriented x3 in no acute distress.  Dermatology:  Wound #1 noted to the right hallux measuring 2.0 x 2.0 x 0.3 cm (LxWxD).   To the above-noted ulceration, there is no eschar. There is a moderate amount of slough, fibrin and necrotic tissue. Granulation tissue and wound base is red. There is no malodor. There is a minimal amount of serosanginous drainage noted. Periwound integrity is intact. Heavy maceration noted.   Vascular: Nonpalpable pedal pulses bilaterally. No edema or erythema noted.   Neurological: Epicritic and protective threshold diminished bilaterally.   Musculoskeletal Exam: Range of motion within normal limits to all pedal and ankle joints bilateral. Muscle strength 5/5 in all groups bilateral.   Assessment: #1 ulceration of the right hallux secondary to diabetes mellitus  #2 diabetes mellitus w/ peripheral neuropathy   Plan of Care:  #1 Patient was evaluated.  #2 Medically necessary excisional debridement including subcutaneous tissue was performed using a tissue nipper and a chisel blade. Excisional debridement of all the necrotic nonviable tissue down to healthy bleeding viable tissue was performed with post-debridement measurements same as pre-. #3 The wound was cleansed and dry sterile dressing applied. #4 Begin using Aquacel Ag daily. Patient has supplies at home.  #5 Continue using post op shoe.  #6 Prescription for  Doxycycline 100 mg #14 provided to patient.  #7 Return to clinic in 3 weeks.    Edrick Kins, DPM [facility]  Dr. Edrick Kins, [place]                                        [place], [place] [address]                Office [phone]  Fax [phone]

## 2018-03-20 ENCOUNTER — Ambulatory Visit (INDEPENDENT_AMBULATORY_CARE_PROVIDER_SITE_OTHER): Payer: Medicare Other | Admitting: *Deleted

## 2018-03-20 DIAGNOSIS — Z5181 Encounter for therapeutic drug level monitoring: Secondary | ICD-10-CM

## 2018-03-20 DIAGNOSIS — I4892 Unspecified atrial flutter: Secondary | ICD-10-CM

## 2018-03-20 LAB — POCT INR: INR: 2.2 (ref 2.0–3.0)

## 2018-03-20 NOTE — Patient Instructions (Signed)
Description   Continue on same dosage 2 tablets daily except 1 tablet on Fridays.  Recheck in 4 weeks.  Call [facility] with any new medications [phone].

## 2018-03-29 IMAGING — CR DG CHEST 2V
2 series · 2 of 2 positions shown · non-contrast
Comparison: Chest x-ray of 09/11/2015

CLINICAL DATA: Short of breath, dry cough for several weeks

EXAM:
CHEST  2 VIEW

[w chest pa]
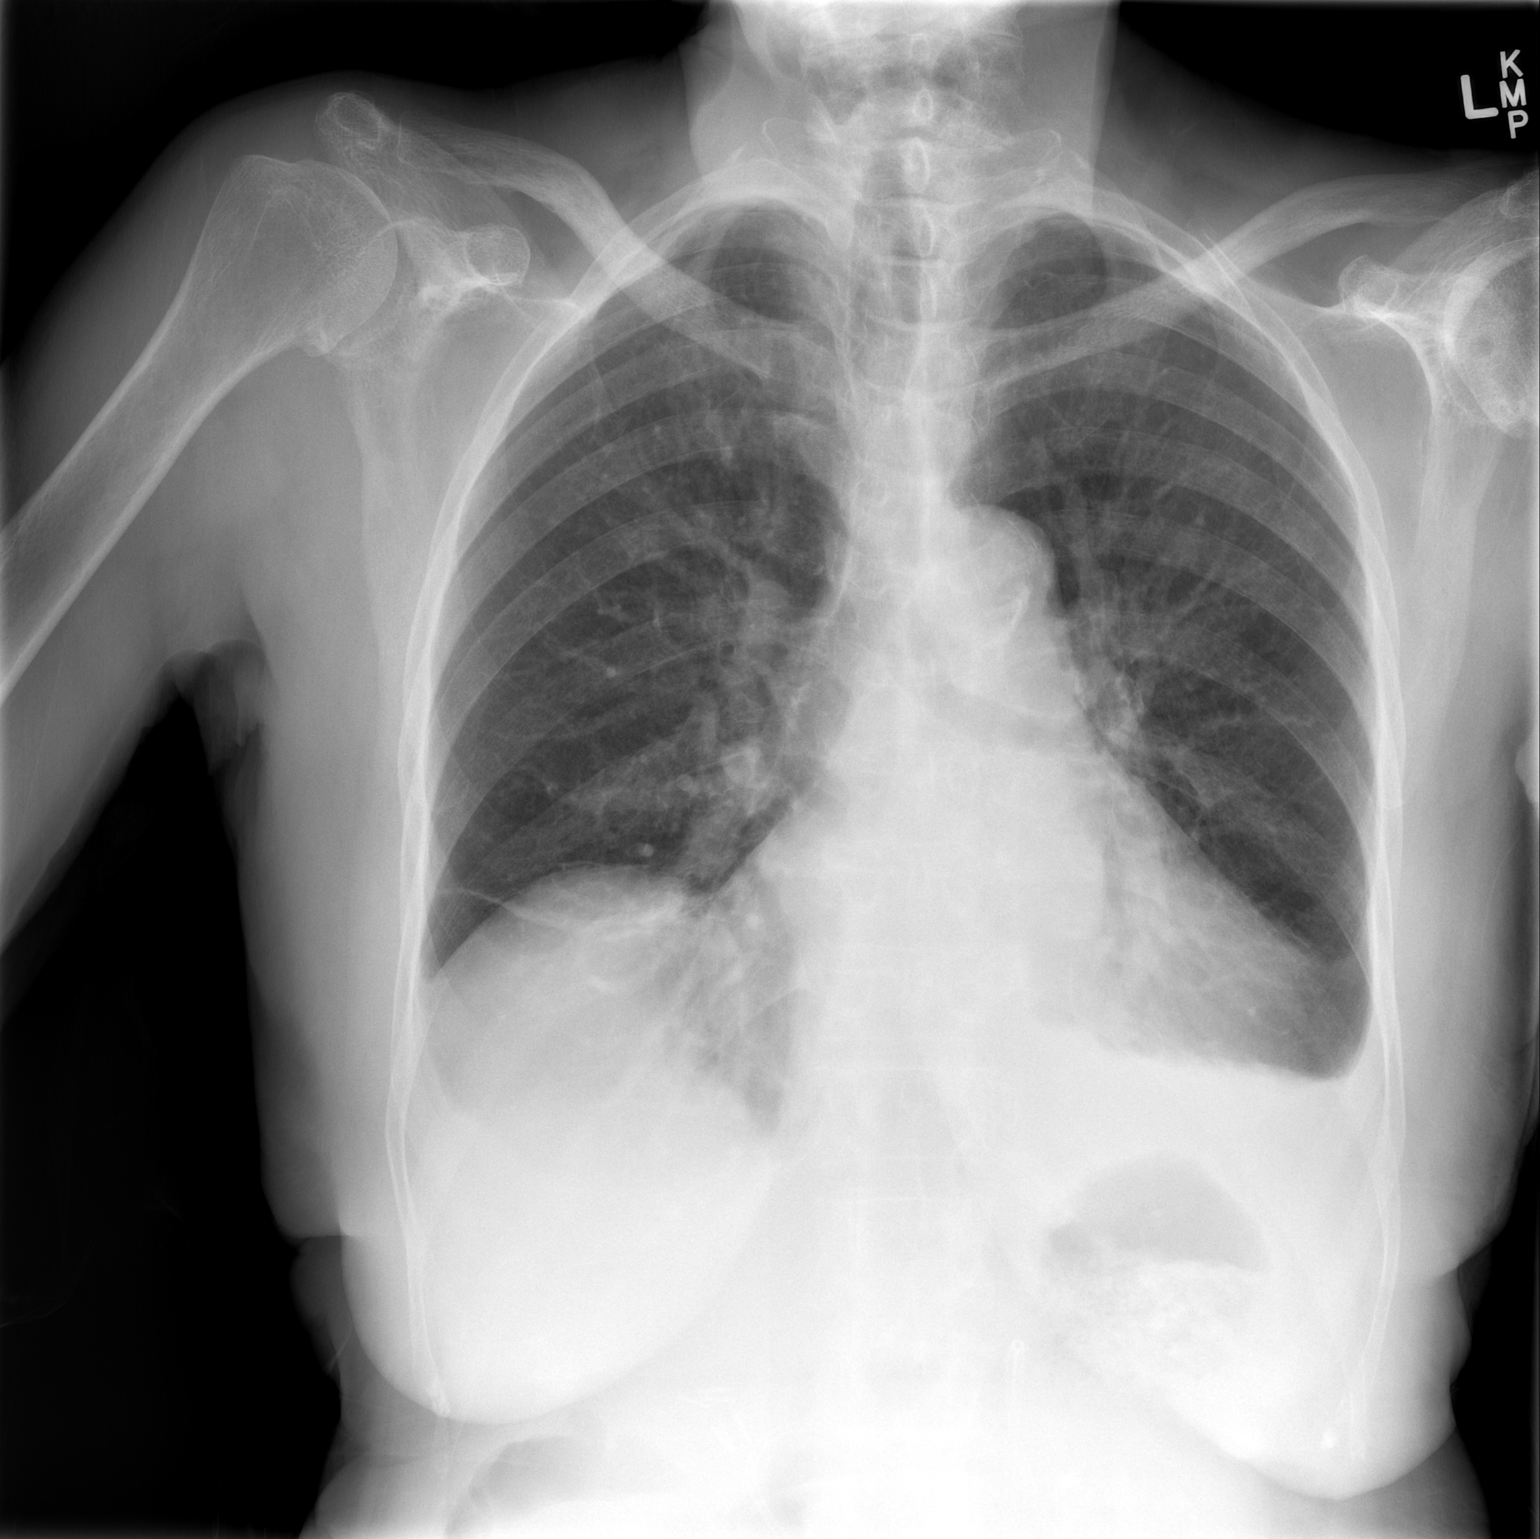

[w chest lat]
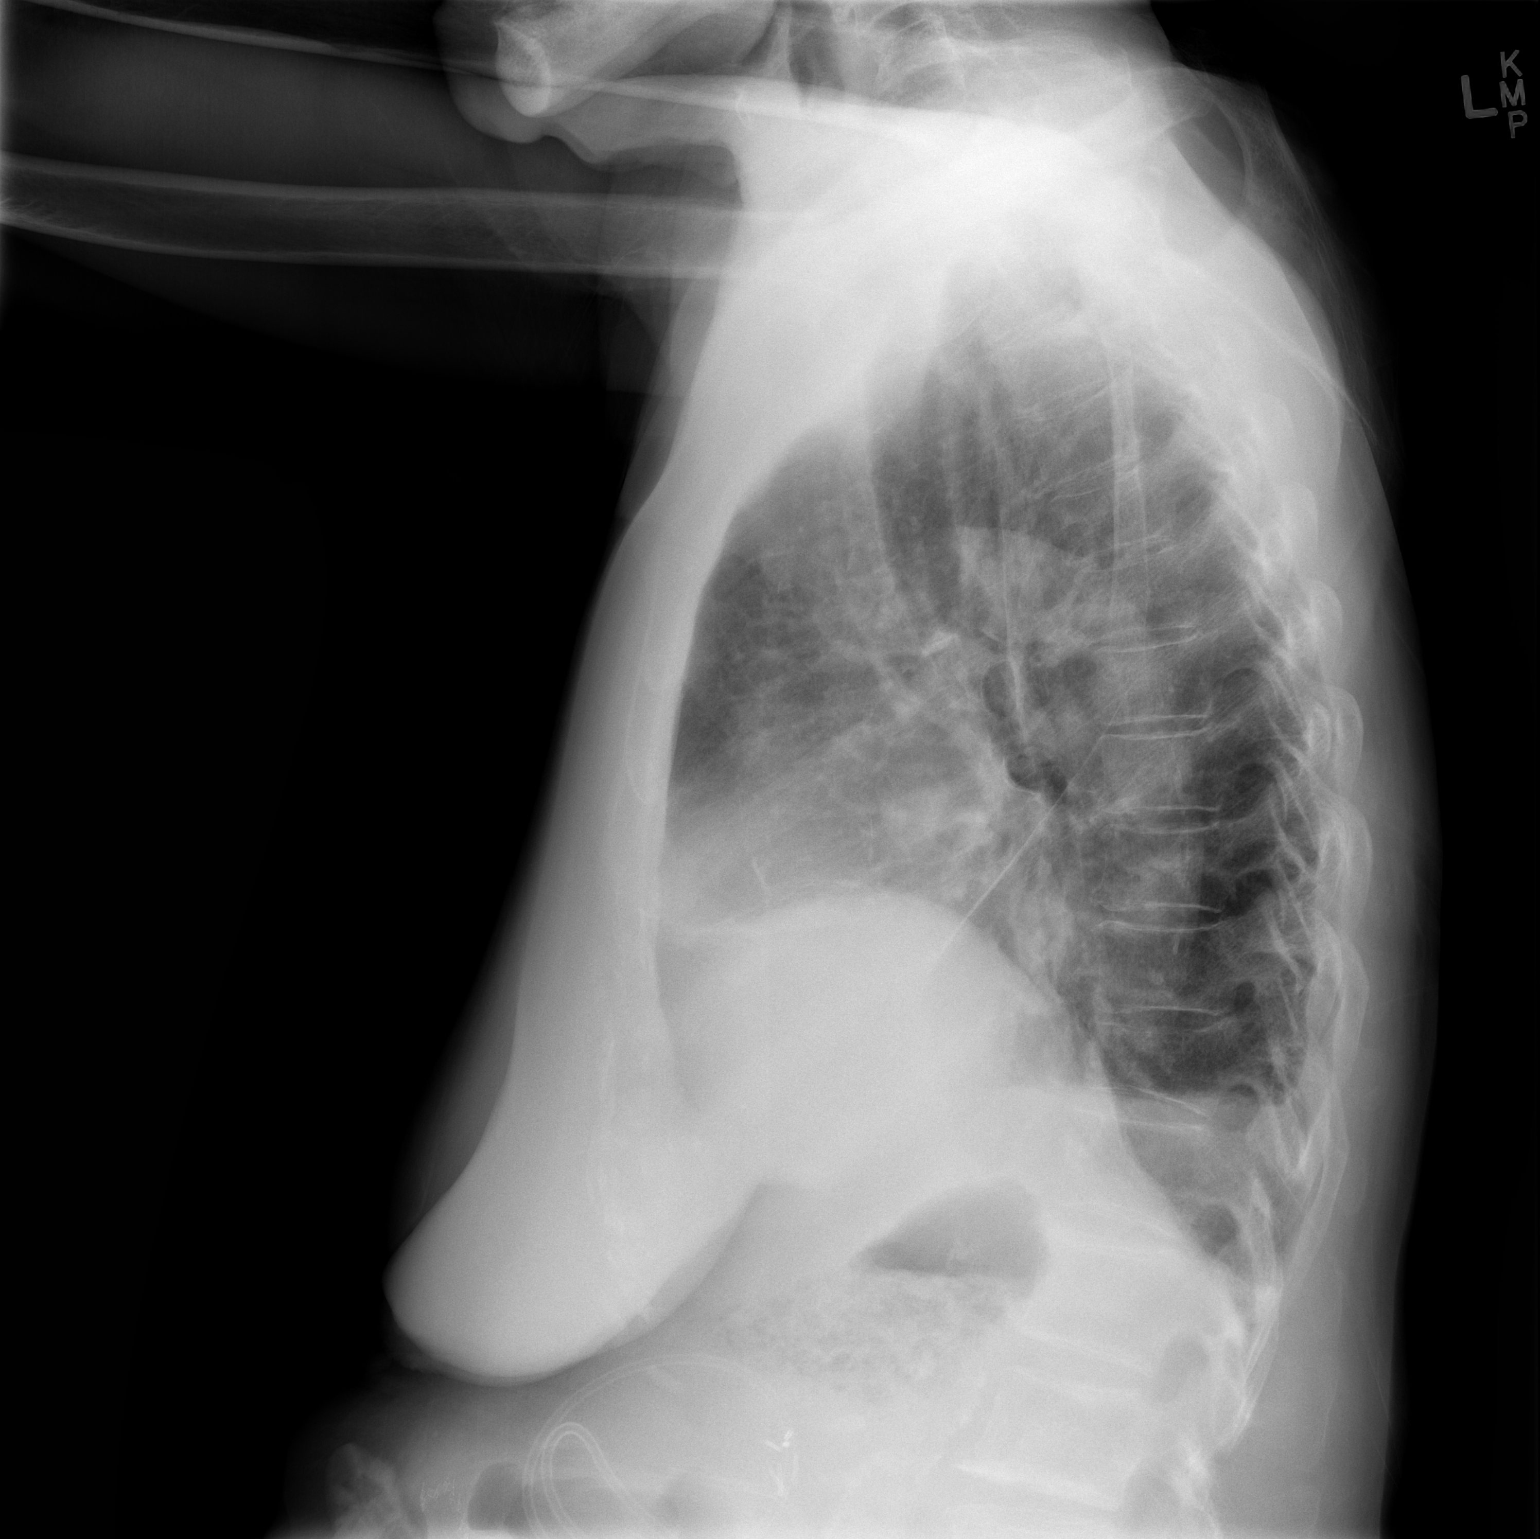

[2 of 2 positions shown; findings below may reference images not displayed]

FINDINGS: Small pleural effusions again are noted left-greater-than-right with
mild basilar atelectasis. No definite pneumonia is seen. Mediastinal
and hilar contours are unremarkable. The heart is mildly enlarged.
There may be minimal pulmonary vascular congestion present.
IMPRESSION: 1. Small pleural effusions left-greater-than-right with mild
bibasilar linear atelectasis.
2. Cardiomegaly.  Question mild pulmonary vascular congestion.

## 2018-04-08 ENCOUNTER — Other Ambulatory Visit (HOSPITAL_COMMUNITY): Payer: Self-pay | Admitting: Radiology

## 2018-04-08 ENCOUNTER — Ambulatory Visit (HOSPITAL_COMMUNITY)
Admission: RE | Admit: 2018-04-08 | Discharge: 2018-04-08 | Disposition: A | Discharge status: Home / Self Care | Payer: Medicare Other | Source: Ambulatory Visit | Attending: Radiology | Admitting: Radiology

## 2018-04-08 ENCOUNTER — Encounter (HOSPITAL_COMMUNITY): Payer: Self-pay | Admitting: Interventional Radiology

## 2018-04-08 DIAGNOSIS — Z431 Encounter for attention to gastrostomy: Secondary | ICD-10-CM | POA: Diagnosis not present

## 2018-04-08 DIAGNOSIS — K9423 Gastrostomy malfunction: Secondary | ICD-10-CM | POA: Diagnosis not present

## 2018-04-08 DIAGNOSIS — R633 Feeding difficulties, unspecified: Secondary | ICD-10-CM

## 2018-04-08 DIAGNOSIS — K9413 Enterostomy malfunction: Secondary | ICD-10-CM | POA: Diagnosis not present

## 2018-04-08 HISTORY — PX: IR GJ TUBE CHANGE: IMG1440

## 2018-04-08 IMAGING — CT CT HEAD W/O CM
3 series · 15 of 47 positions shown, 18 images · non-contrast
Comparison: Head CT dated 03/22/2014 and brain MRI dated
03/23/2014.

CLINICAL DATA: Weakness, low heart rate.

EXAM:
CT HEAD WITHOUT CONTRAST
TECHNIQUE: Contiguous axial images were obtained from the base of the skull
through the vertex without intravenous contrast.

[Series 2: head 5.0 h30s · axial · 0.41mm/px · z∈[-163,-38]mm · 9 of 31 slices shown, 12 images]
[im 3/31  brain]
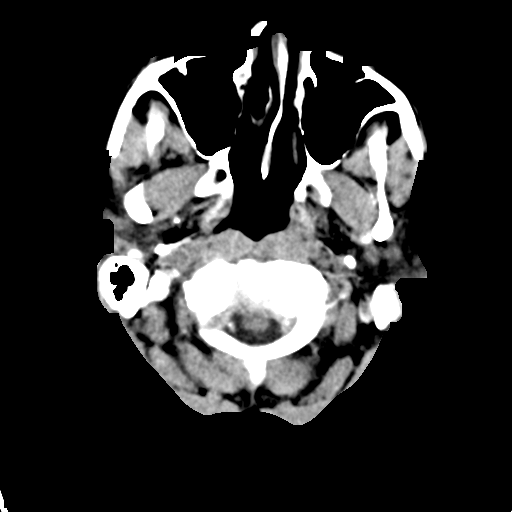
[im 3/31  bone]
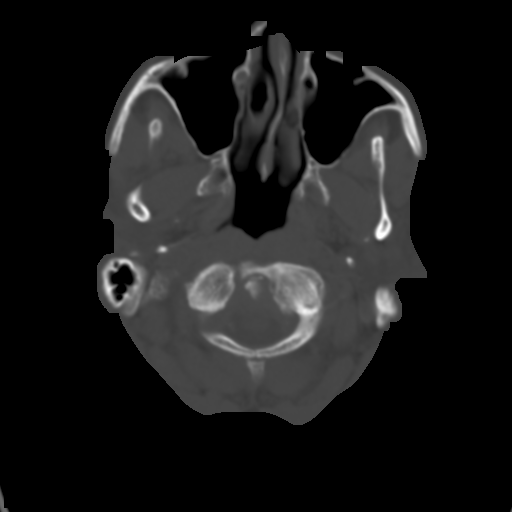
[im 6/31  brain]
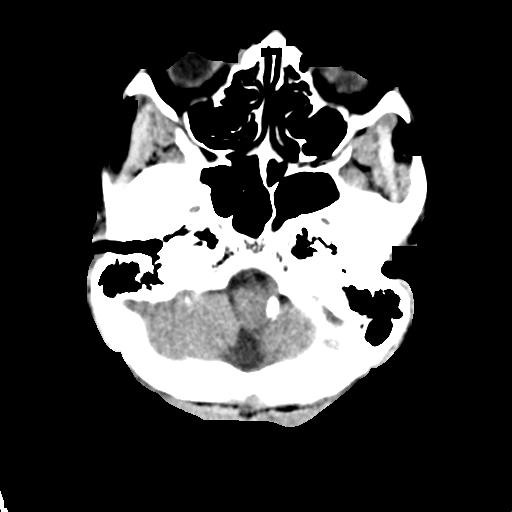
[im 9/31  brain]
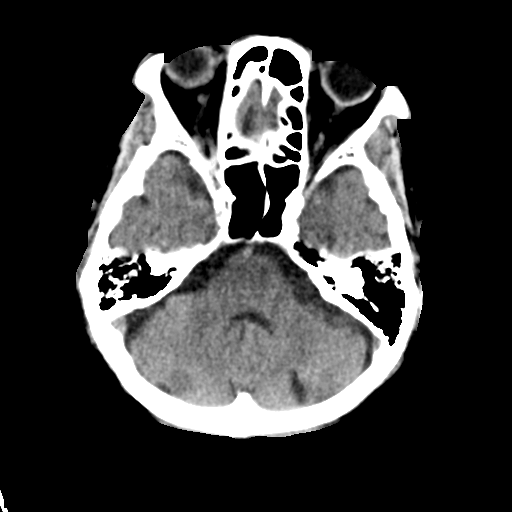
[im 12/31  brain]
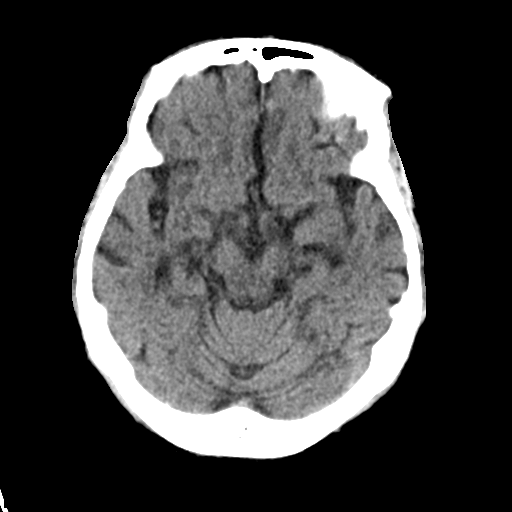
[im 16/31  brain]
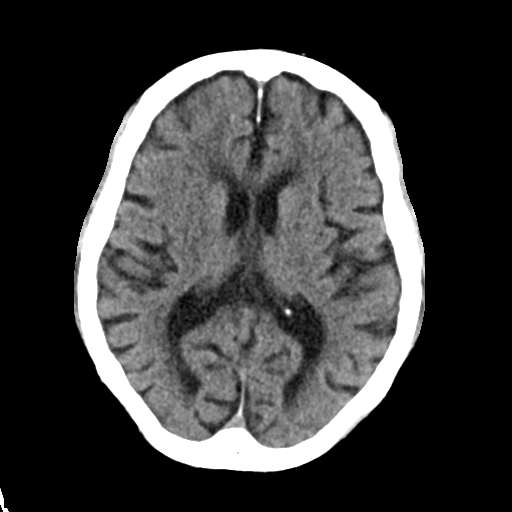
[im 16/31  bone]
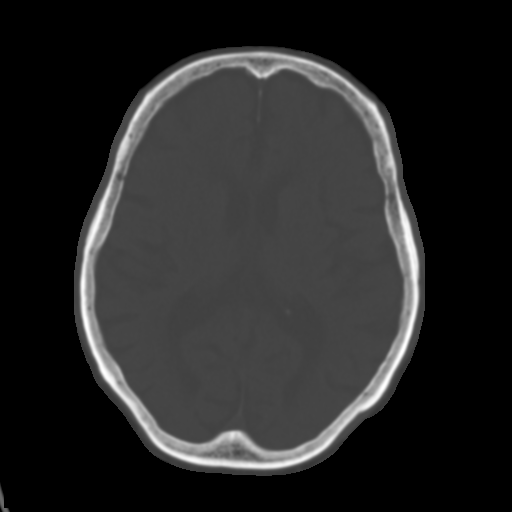
[im 19/31  brain]
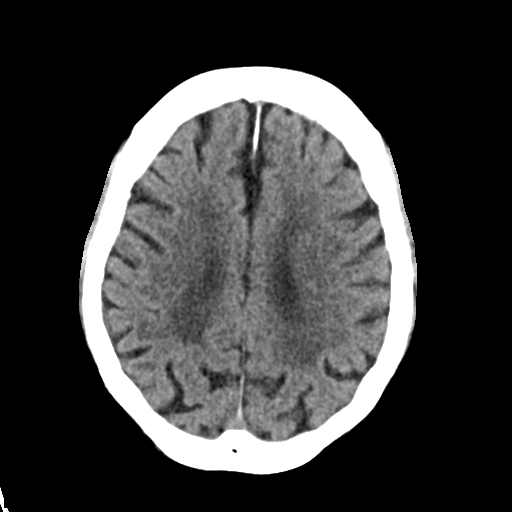
[im 22/31  brain]
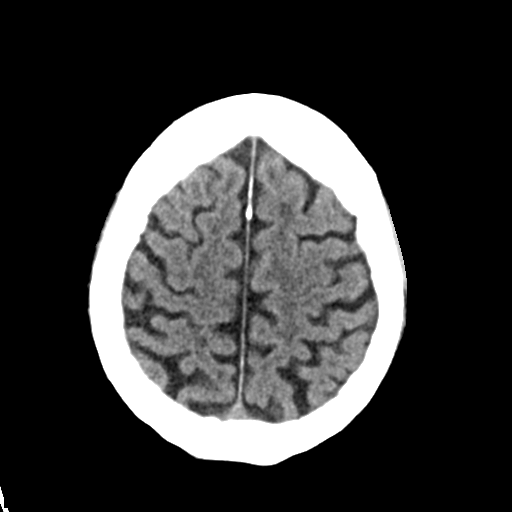
[im 25/31  brain]
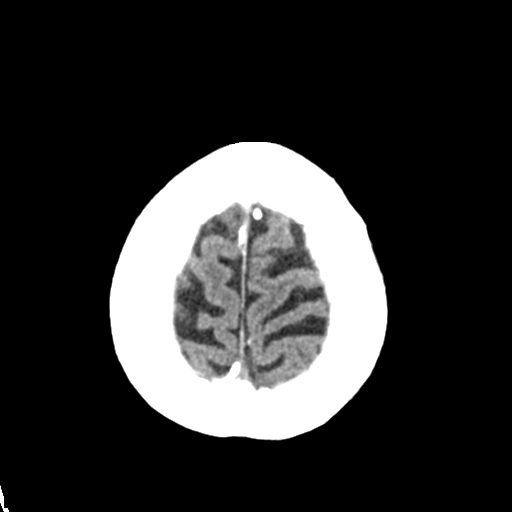
[im 28/31  brain]
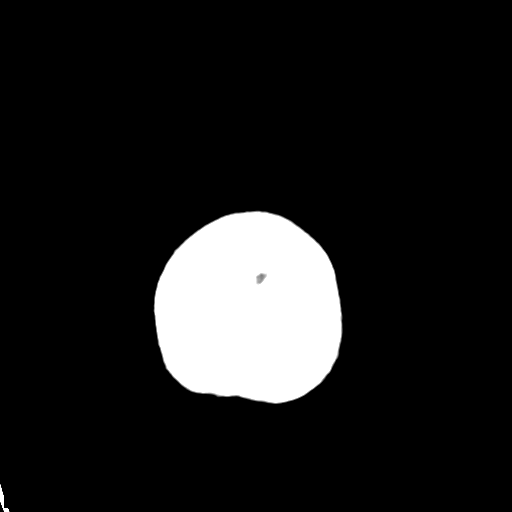
[im 28/31  bone]
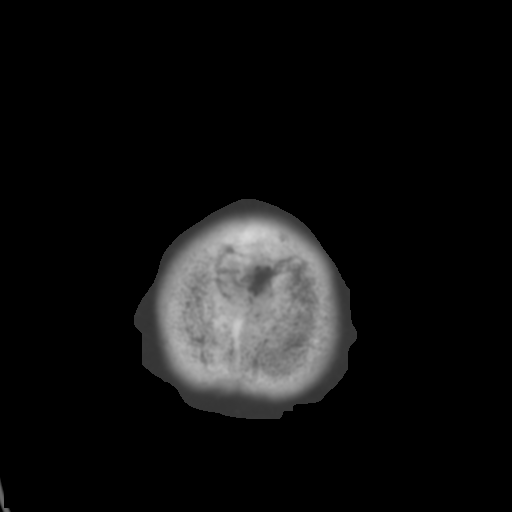

[Series 4: head 3.0 mpr · coronal · 0.32mm/px · 3 of 66 slices shown (1 of 2)]
[im 22/66  brain]
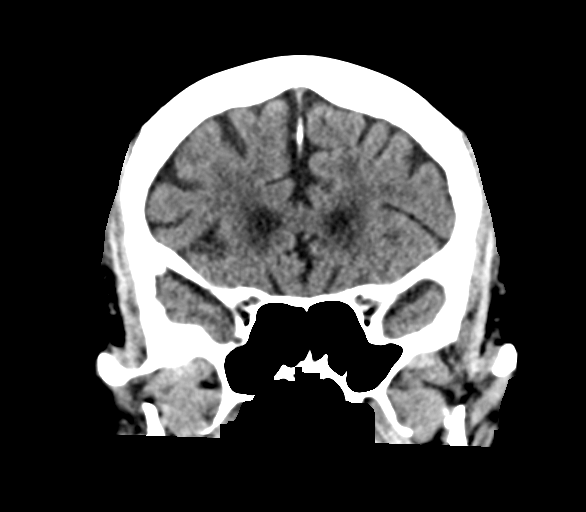
[im 29/66  brain]
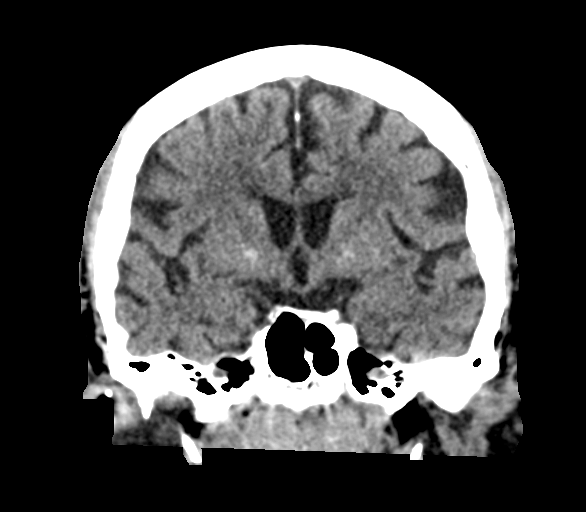
[im 37/66  brain]
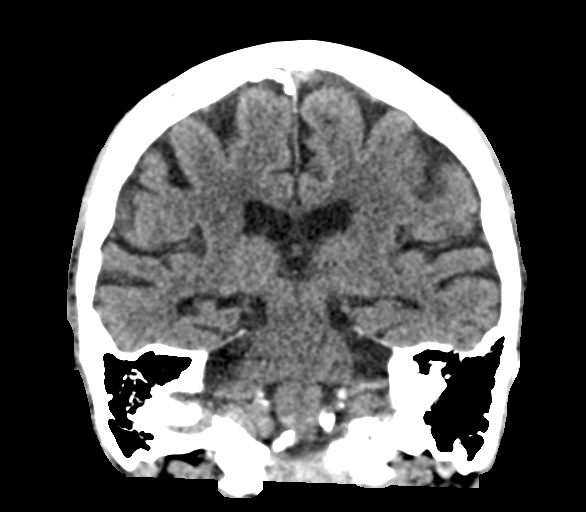

[Series 5: head 3.0 mpr · sagittal · 0.30mm/px · 3 of 61 slices shown (2 of 2)]
[im 21/61  brain]
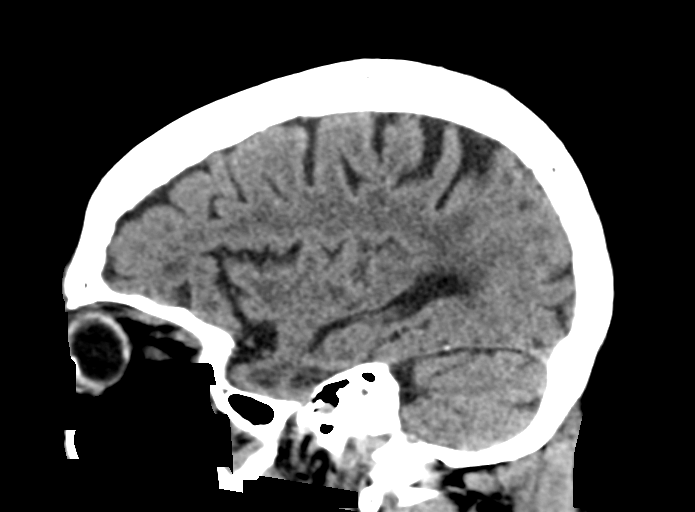
[im 31/61  brain]
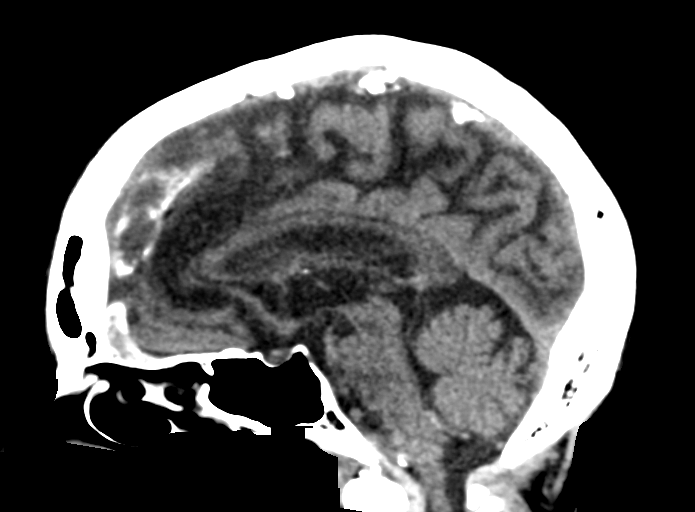
[im 41/61  brain]
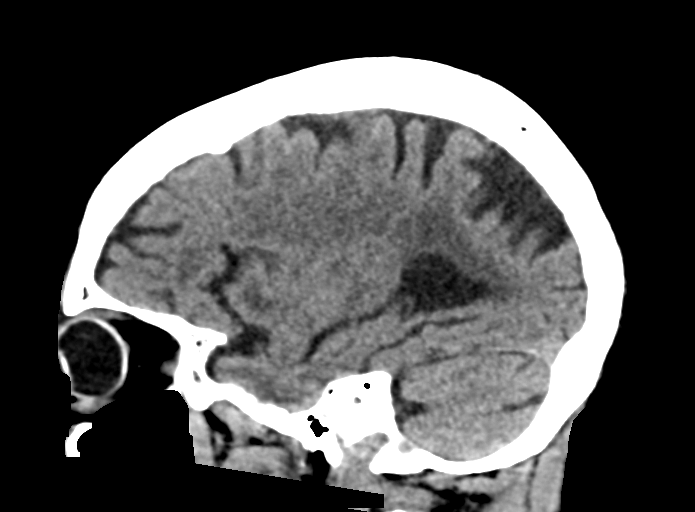

[15 of 47 positions shown; findings below may reference images not displayed]

FINDINGS: Brain: Again noted is generalized age related parenchymal atrophy
with commensurate dilatation of the ventricles and sulci. Chronic
small vessel ischemic changes again noted within the bilateral
periventricular and subcortical white matter regions.

There is no mass, hemorrhage, edema or other evidence of acute
parenchymal abnormality. No extra-axial hemorrhage.

Vascular: No hyperdense vessel or unexpected calcification. There
are chronic calcified atherosclerotic changes of the large vessels
at the skull base.

Skull: Negative for fracture or focal lesion.

Sinuses/Orbits: No acute findings.

Other: None.
IMPRESSION: 1. No acute intracranial abnormality. No intracranial mass,
hemorrhage or edema.
2. Atrophy and chronic ischemic changes in the white matter.

## 2018-04-08 IMAGING — CR DG ABD PORTABLE 1V
1 series · 1 of 1 positions shown · non-contrast
Comparison: Abdomen x-ray dated 09/12/2015.

CLINICAL DATA: Generalized abdominal pain for 2 days.

EXAM:
PORTABLE ABDOMEN - 1 VIEW

[AP]
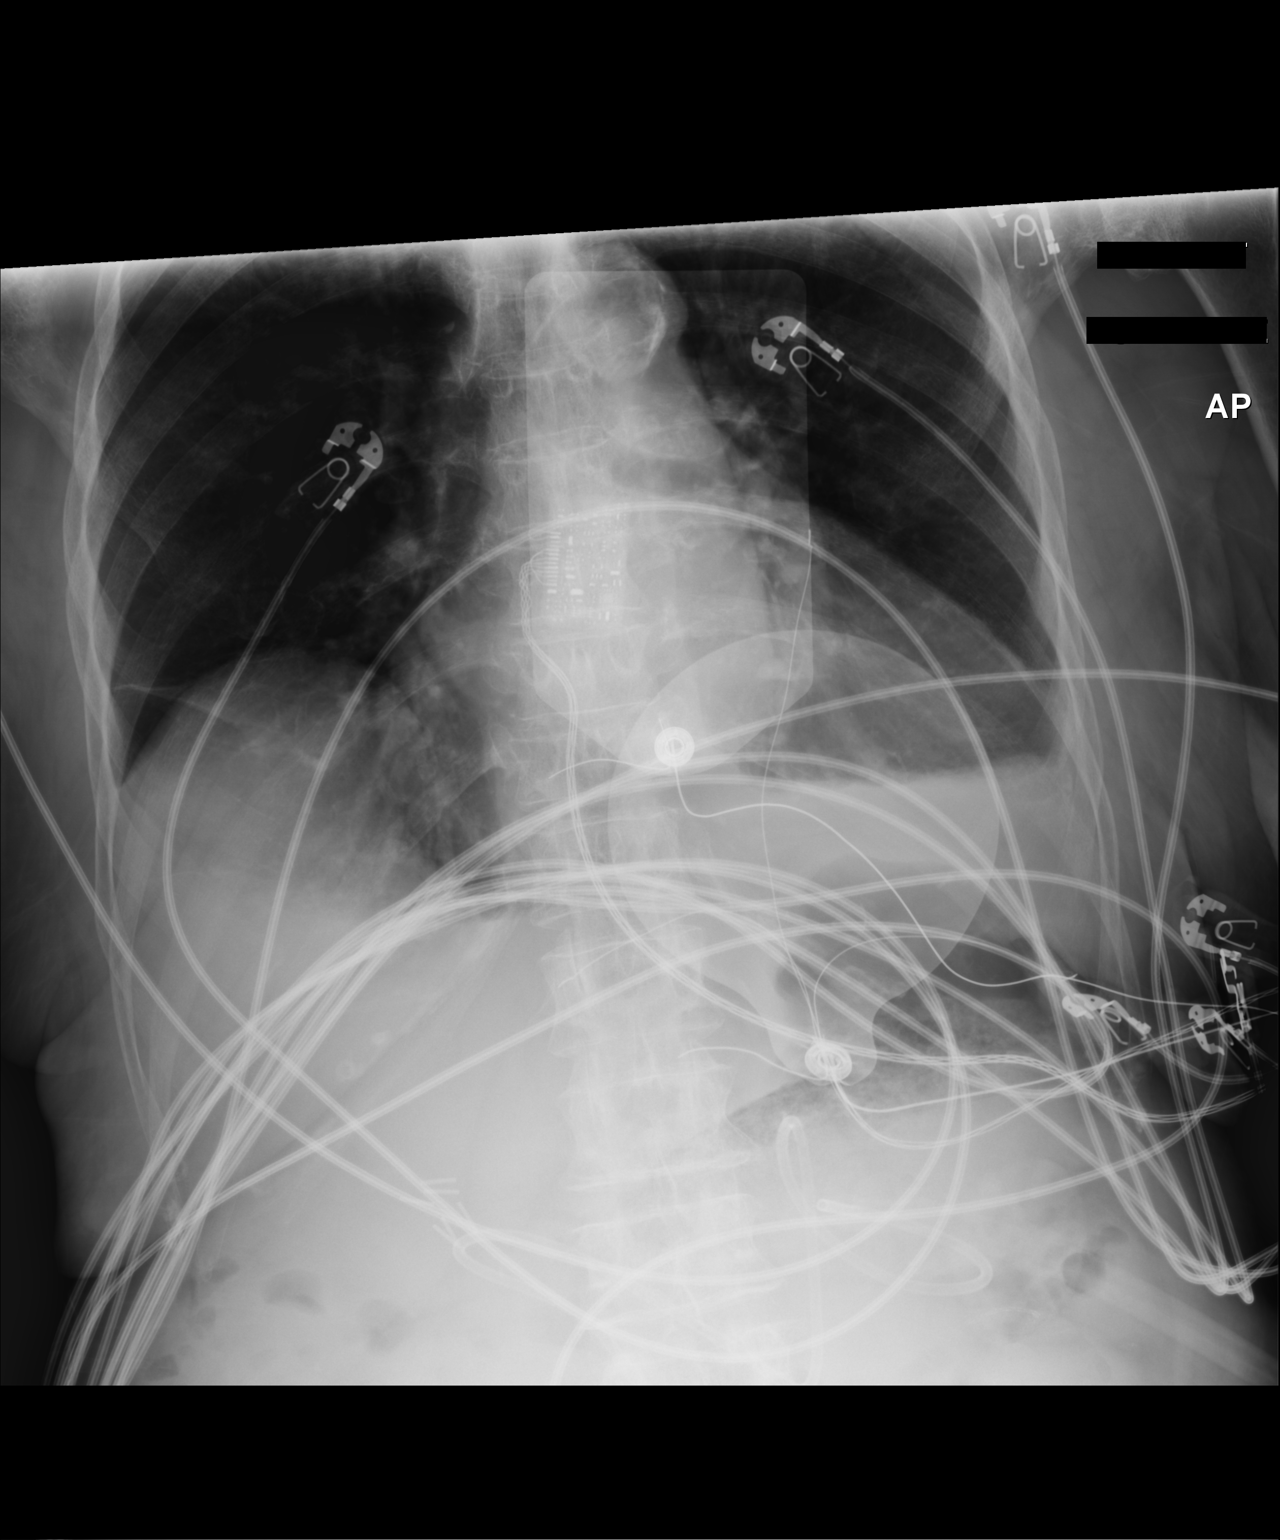

[1 of 1 positions shown; findings below may reference images not displayed]

FINDINGS: Single upright AP view centered at the level of the upper abdomen.
Study limited by numerous overlying wires and pacer pads. Visualized
bowel gas pattern is nonobstructive. No evidence of free
intraperitoneal air seen. Probable small pleural effusions and/or
atelectasis at each lung base.
IMPRESSION: Limited exam due to numerous overlying, presumed extraneous, wires
and pacer pads. Visualized bowel gas pattern is nonobstructive and
there is no evidence of free intraperitoneal air seen.

Probable small pleural effusions and/or atelectasis at each lung
base.

## 2018-04-08 IMAGING — CT CT ABD-PELV W/O CM
2 of 4 series · 16 of 46 positions shown, 18 images · non-contrast
Comparison: CT abdomen dated 09/11/2015.

CLINICAL DATA: Abdominal pain since PEG tube placement on [REDACTED].

EXAM:
CT ABDOMEN AND PELVIS WITHOUT CONTRAST
TECHNIQUE: Multidetector CT imaging of the abdomen and pelvis was performed
following the standard protocol without IV contrast.

[Series 3: abd/ pelvis 5.0 i30f 1 · axial · 0.80mm/px · z∈[-847,-402]mm · 13 of 99 slices shown, 15 images]
[im 5/99  soft-tissue]
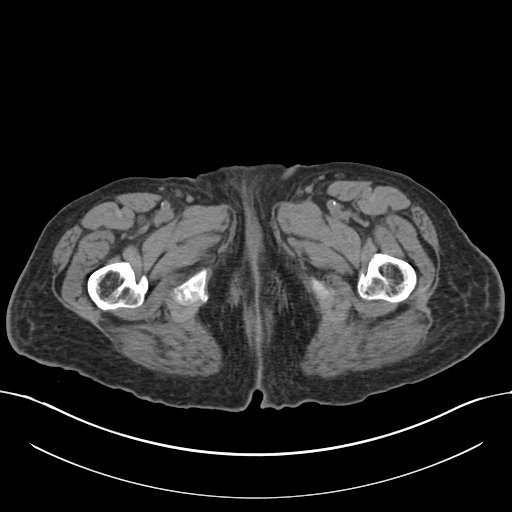
[im 5/99  bone]
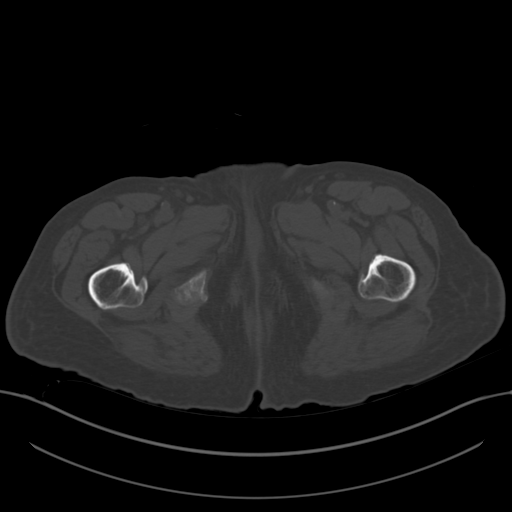
[im 13/99  soft-tissue]
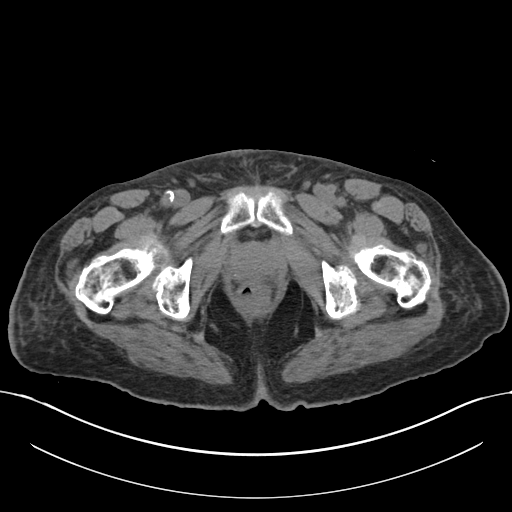
[im 22/99  soft-tissue]
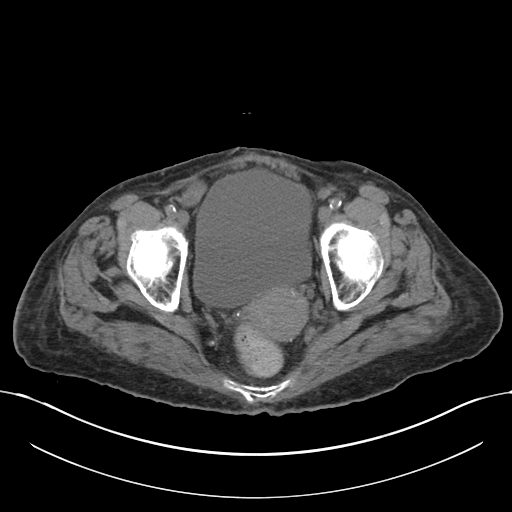
[im 26/99  soft-tissue]
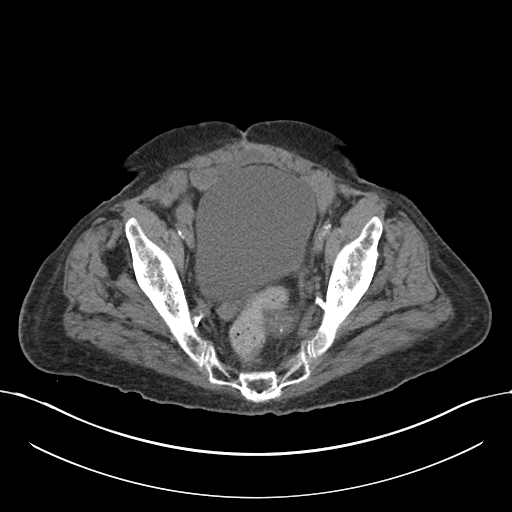
[im 35/99  soft-tissue]
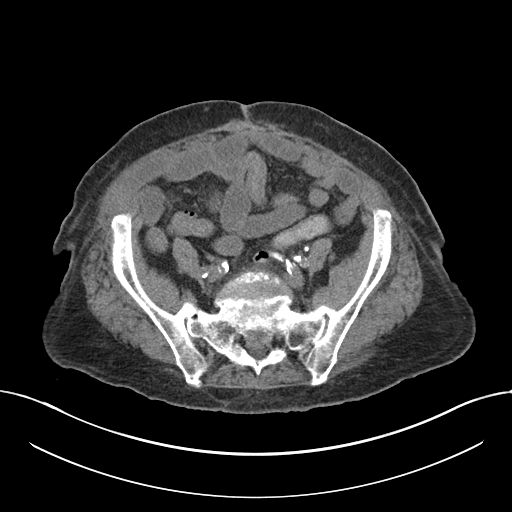
[im 43/99  soft-tissue]
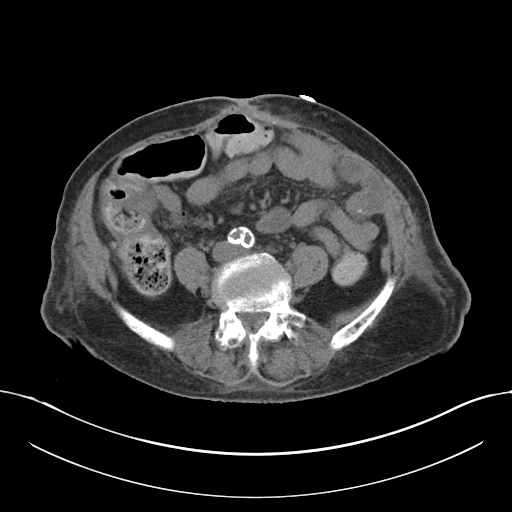
[im 52/99  soft-tissue]
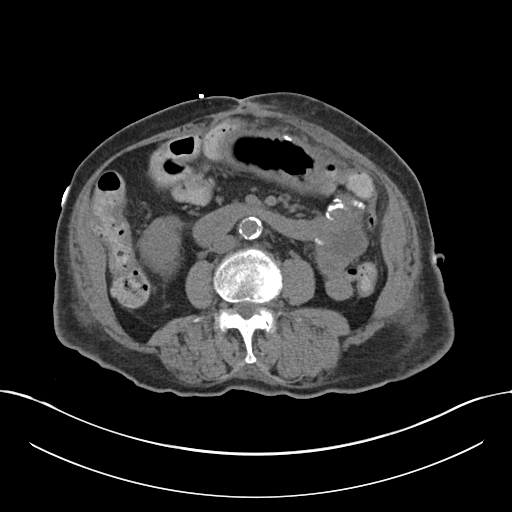
[im 56/99  soft-tissue]
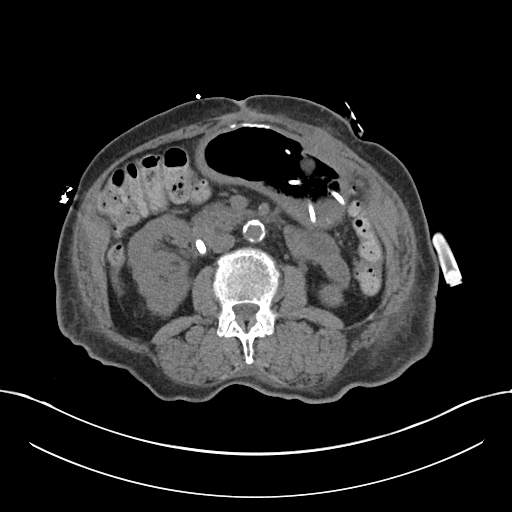
[im 64/99  soft-tissue]
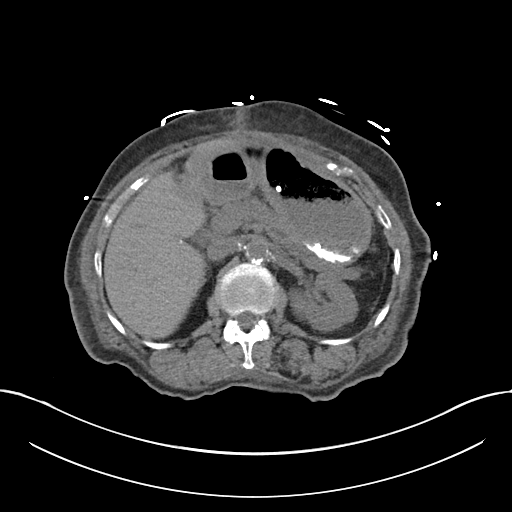
[im 64/99  bone]
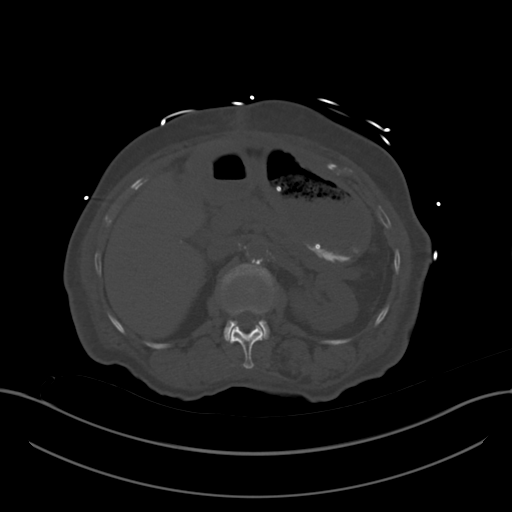
[im 73/99  soft-tissue]
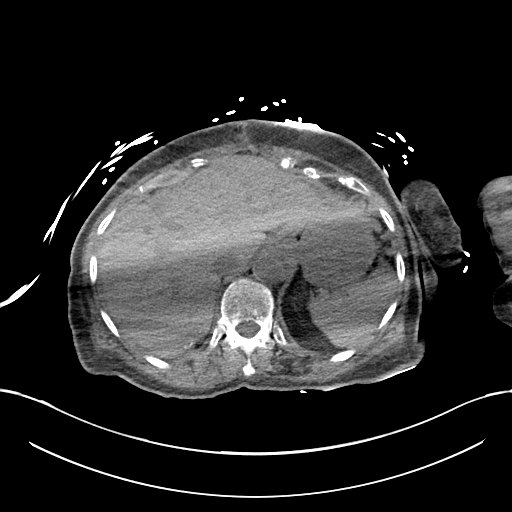
[im 77/99  soft-tissue]
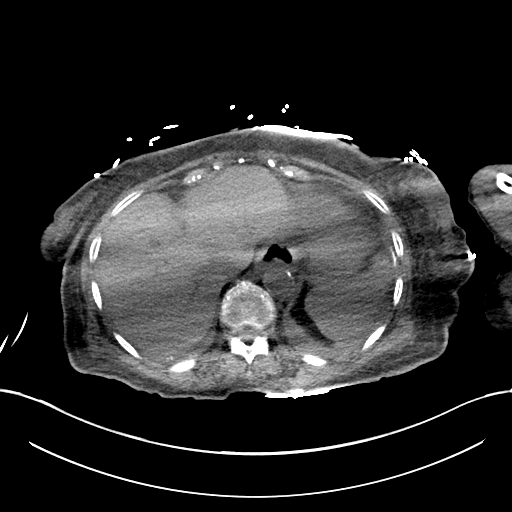
[im 86/99  soft-tissue]
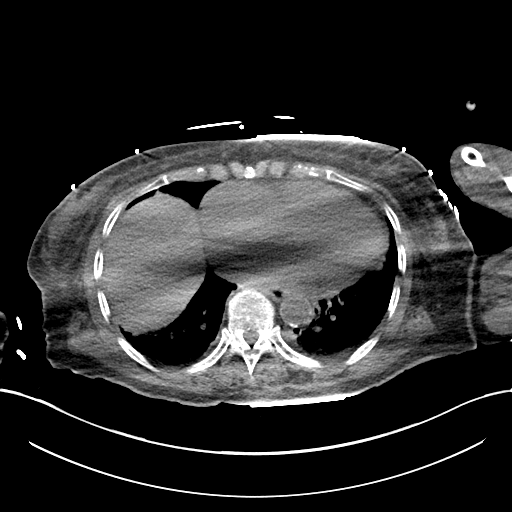
[im 94/99  soft-tissue]
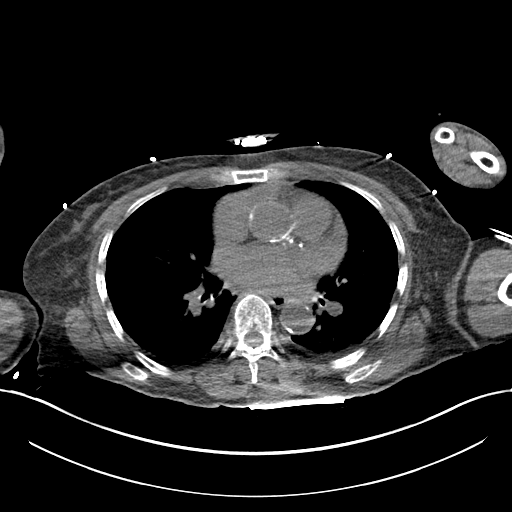

[Series 6: cor st · coronal · 0.68mm/px · 3 of 89 slices shown]
[im 30/89  soft-tissue]
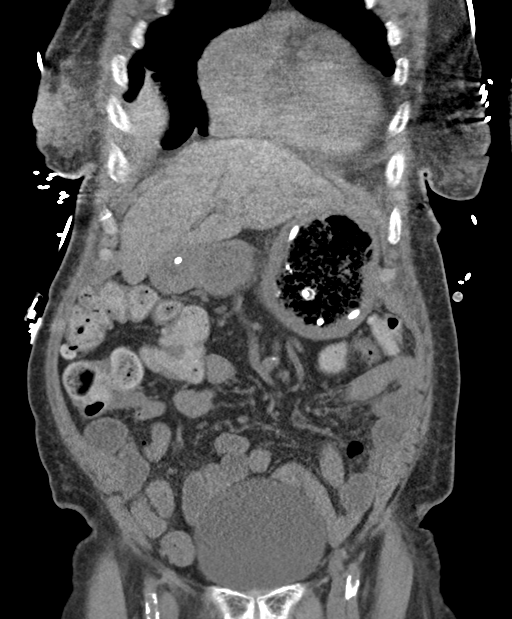
[im 40/89  soft-tissue]
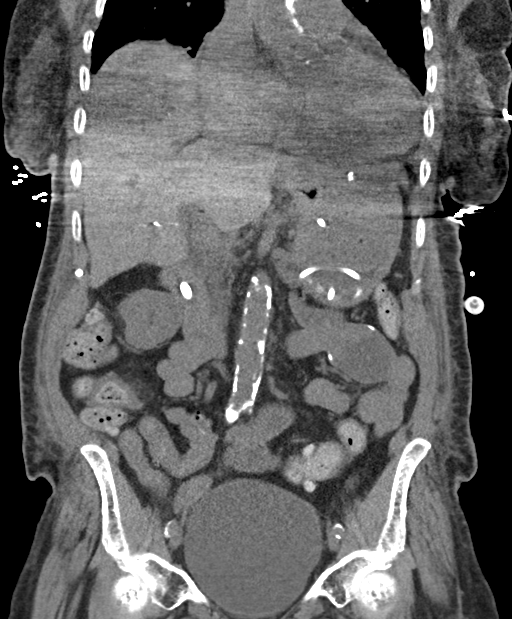
[im 49/89  soft-tissue]
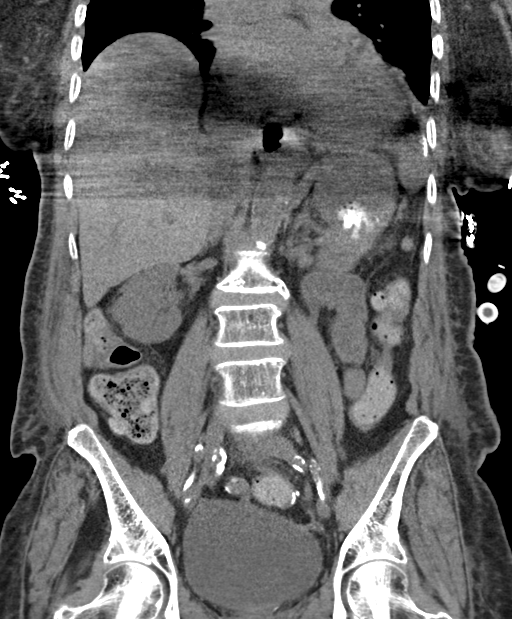

[16 of 46 positions shown; findings below may reference images not displayed]

FINDINGS: Lower chest: Mild bibasilar atelectasis. Small left pleural
effusion.

Hepatobiliary: Status post cholecystectomy. Small hypodense lesions
within the liver were better demonstrated on the earlier contrast
enhanced CT.

Pancreas: No mass or inflammatory process identified on this
un-enhanced exam.

Spleen: Within normal limits in size.

Adrenals/Urinary Tract: Kidneys are unremarkable without stone or
hydronephrosis. No ureteral or bladder calculi identified.

Stomach/Bowel: Interval resolution of the stomach distension status
post percutaneous gastrostomy placement. Enteric tube placed via the
gastrostomy site has its tip in the descending duodenum.

Surgical small bowel anastomosis noted within the left abdomen,
without evidence of obstruction or other surgical complicating
feature. No dilated large or small bowel loops. Scattered
diverticulosis noted throughout the colon without evidence of acute
diverticulitis.

Vascular/Lymphatic: Heavy atherosclerotic changes of the normal
caliber abdominal aorta and pelvic vasculature. No enlarged lymph
nodes seen.

Reproductive: No mass or other significant abnormality.

Other: No free fluid or abscess collection identified. No free
intraperitoneal air.

Musculoskeletal: Degenerative changes throughout the thoracolumbar
spine. No acute or suspicious osseous finding. Superficial soft
tissues are unremarkable.
IMPRESSION: 1. Significant reduction in the gastric distention status post
gastrostomy, with associated enteric tube passing through the
stomach with tip in the descending duodenum.
2. Remainder of the abdomen and pelvis is unremarkable. No small or
large bowel obstruction. No free fluid or abscess. No free
intraperitoneal air.
3. Colonic diverticulosis without evidence of acute diverticulitis.
4. Aortic atherosclerosis.
5. Small left pleural effusion and mild bibasilar atelectasis.

## 2018-04-08 MED ORDER — IOPAMIDOL (ISOVUE-300) INJECTION 61%
50.0000 mL | Freq: Once | INTRAVENOUS | Status: AC | PRN
  Administered 2018-04-08: 5 mL via ORAL

## 2018-04-08 MED ORDER — IOPAMIDOL (ISOVUE-300) INJECTION 61%
INTRAVENOUS | Status: AC
  Administered 2018-04-08: 5 mL via ORAL
  Filled 2018-04-08: qty 50

## 2018-04-10 ENCOUNTER — Other Ambulatory Visit: Payer: Self-pay | Admitting: *Deleted

## 2018-04-10 ENCOUNTER — Ambulatory Visit (INDEPENDENT_AMBULATORY_CARE_PROVIDER_SITE_OTHER): Payer: Medicare Other | Admitting: Podiatry

## 2018-04-10 DIAGNOSIS — L97512 Non-pressure chronic ulcer of other part of right foot with fat layer exposed: Secondary | ICD-10-CM

## 2018-04-10 DIAGNOSIS — E0859 Diabetes mellitus due to underlying condition with other circulatory complications: Secondary | ICD-10-CM

## 2018-04-10 DIAGNOSIS — I70235 Atherosclerosis of native arteries of right leg with ulceration of other part of foot: Secondary | ICD-10-CM

## 2018-04-10 IMAGING — CR DG CHEST 1V PORT
1 series · 1 of 1 positions shown · non-contrast
Comparison: 10/27/2015

CLINICAL DATA: New onset shortness of Breath

EXAM:
PORTABLE CHEST 1 VIEW

[AP]
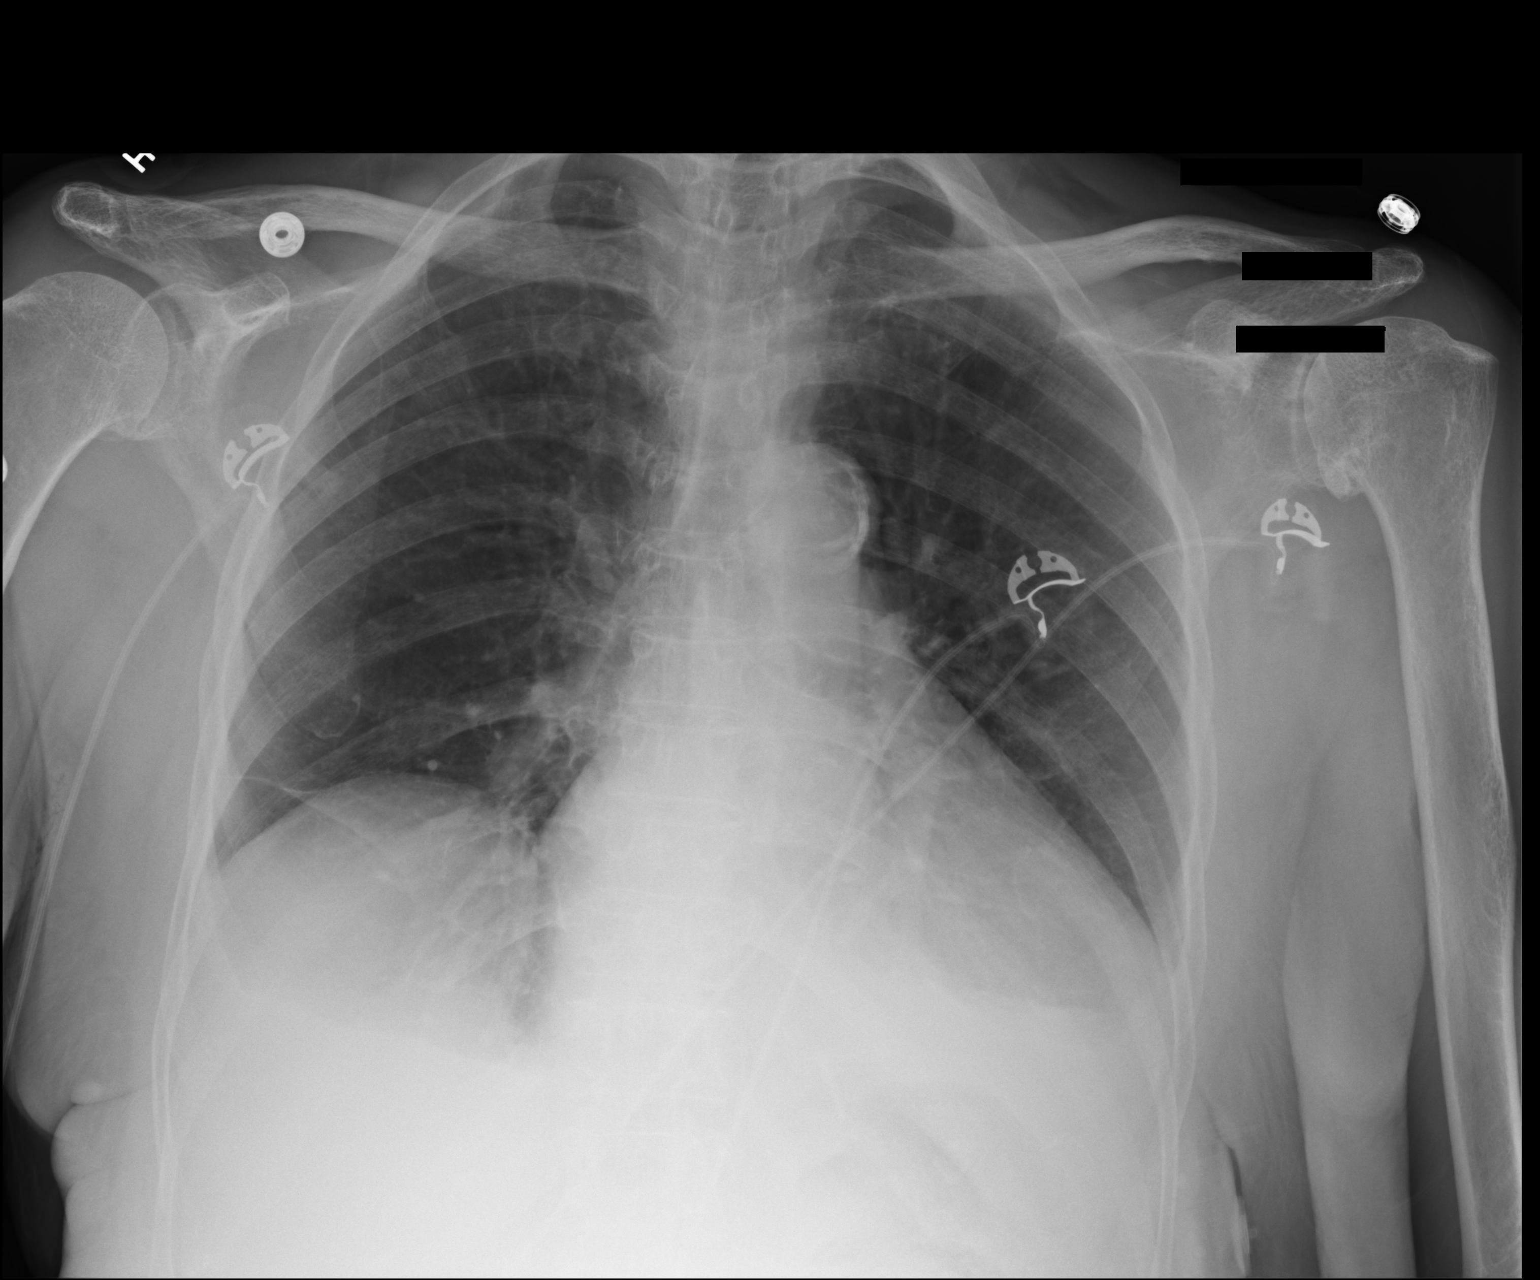

[1 of 1 positions shown; findings below may reference images not displayed]

FINDINGS: Cardiac shadow is mildly enlarged but stable. Stable aortic
calcifications are noted. Minimal scarring is again noted in the
right lung base. Small pleural effusions are again seen bilaterally.
IMPRESSION: Stable appearance of the chest.

## 2018-04-10 MED ORDER — MOMETASONE FURO-FORMOTEROL FUM 200-5 MCG/ACT IN AERO: INHALATION_SPRAY | RESPIRATORY_TRACT | 1 refills | Status: DC

## 2018-04-10 NOTE — Telephone Encounter (Signed)
[facility]

## 2018-04-11 ENCOUNTER — Other Ambulatory Visit: Payer: Self-pay | Admitting: Cardiology

## 2018-04-11 ENCOUNTER — Telehealth: Payer: Self-pay | Admitting: *Deleted

## 2018-04-11 IMAGING — RF DG SWALLOWING FUNCTION - NRPT [facility]
1 series · 18 of 24 positions shown · non-contrast
Comparison: none

[Series 1: run · 23 acquisitions, 18 frames shown]
[im 1/23]
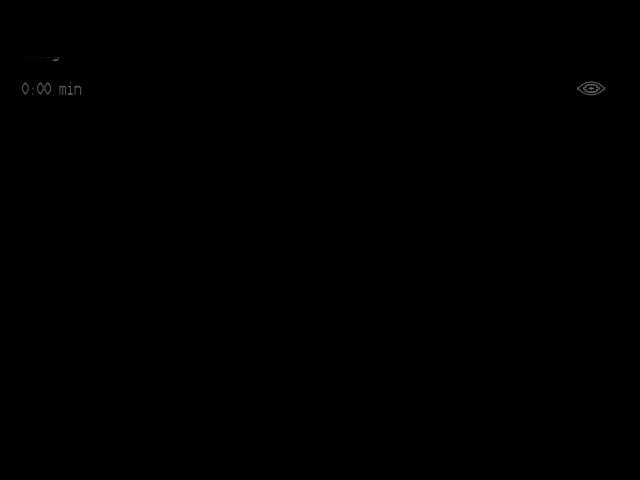
[im 3/23]
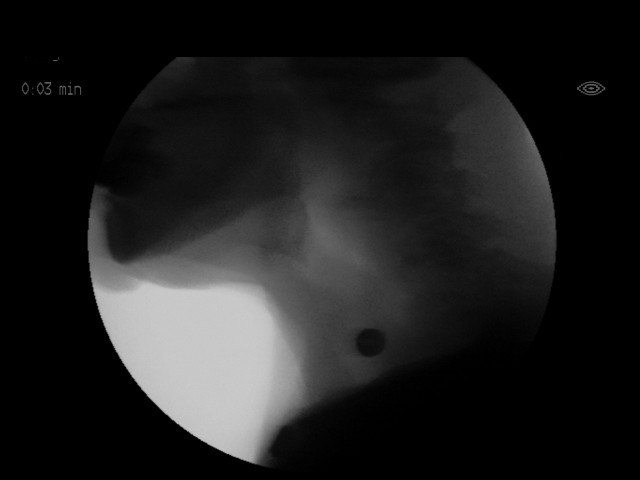
[im 4/23]
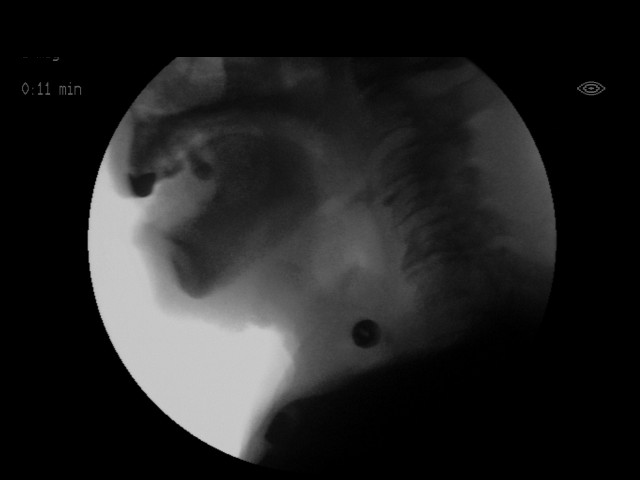
[im 5/23]
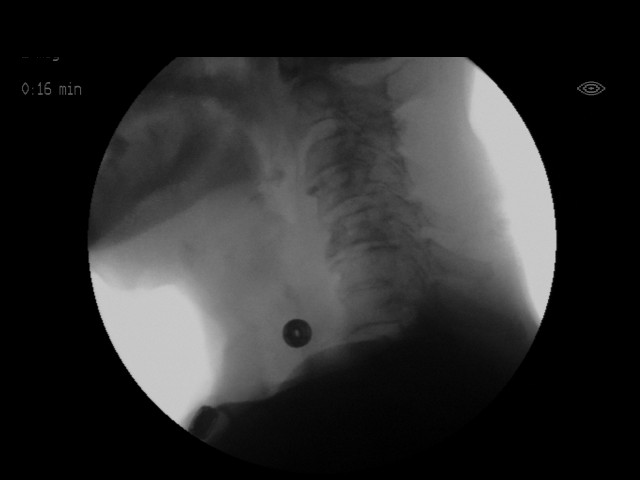
[im 7/23]
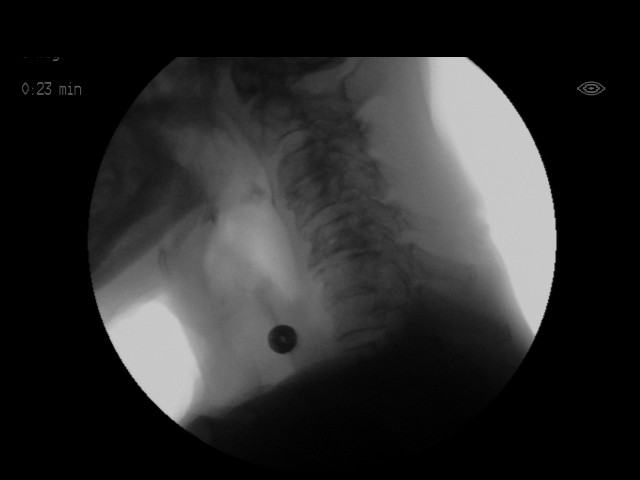
[im 8/23]
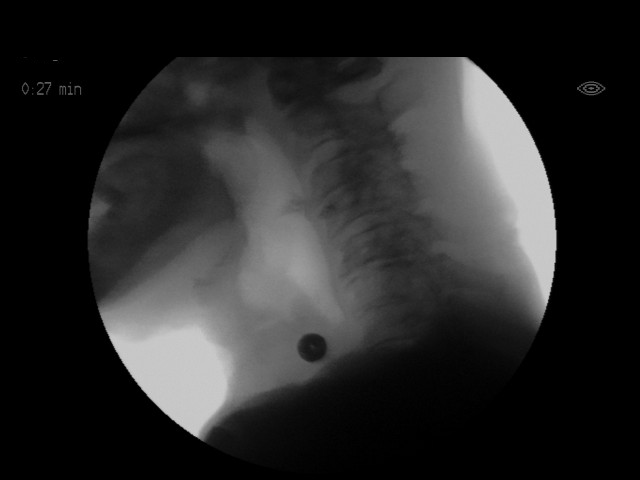
[im 9/23]
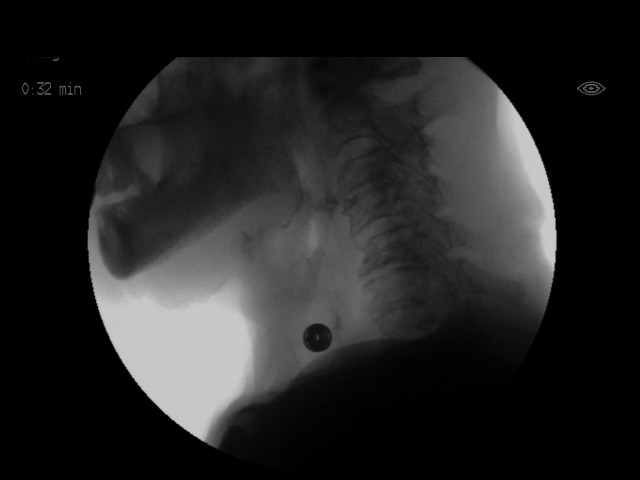
[im 11/23]
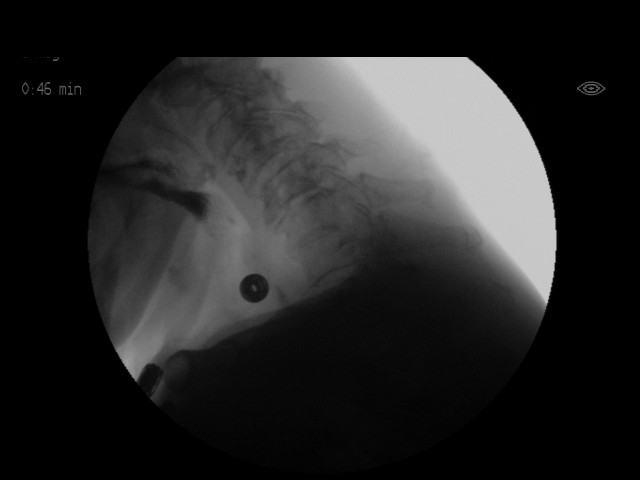
[im 12/23]
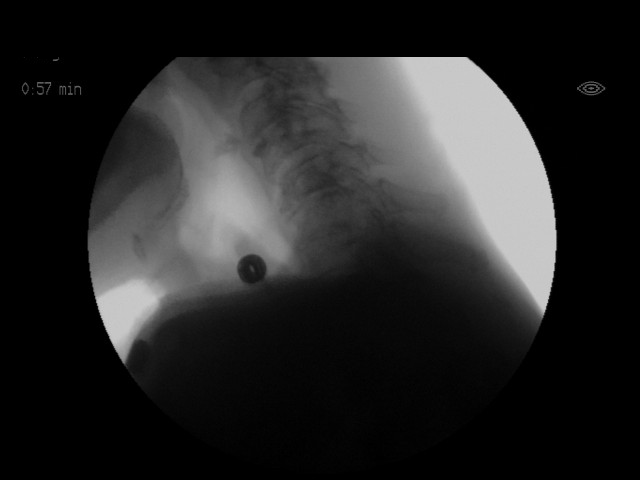
[im 12/23]
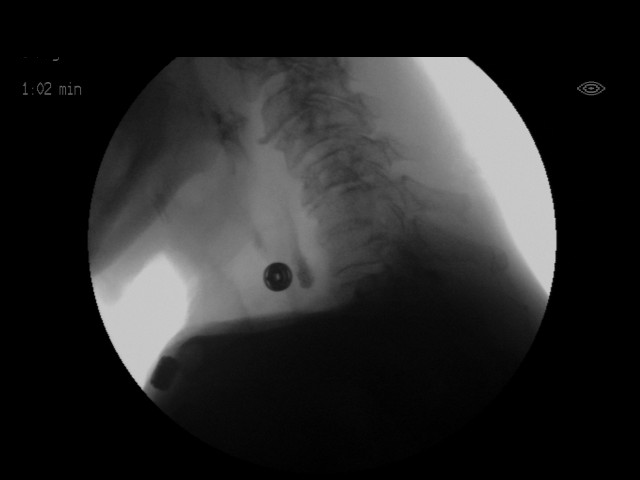
[im 14/23]
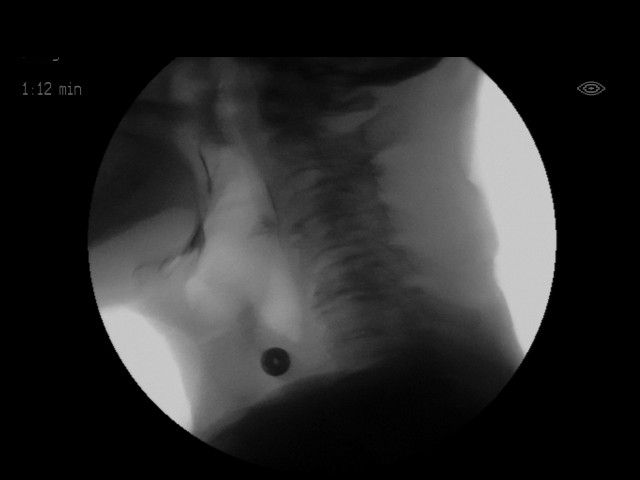
[im 15/23]
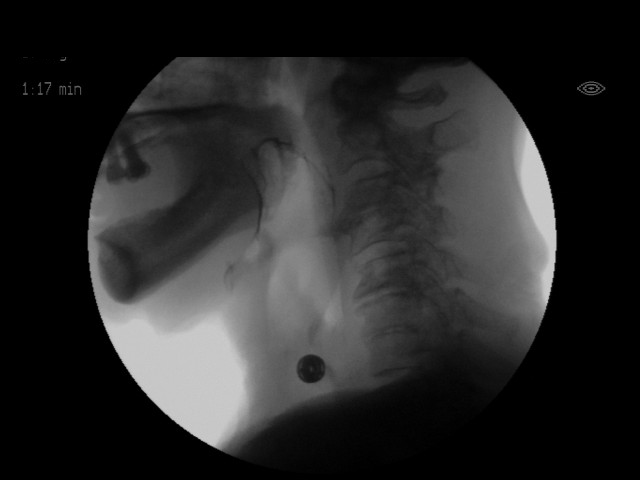
[im 16/23]
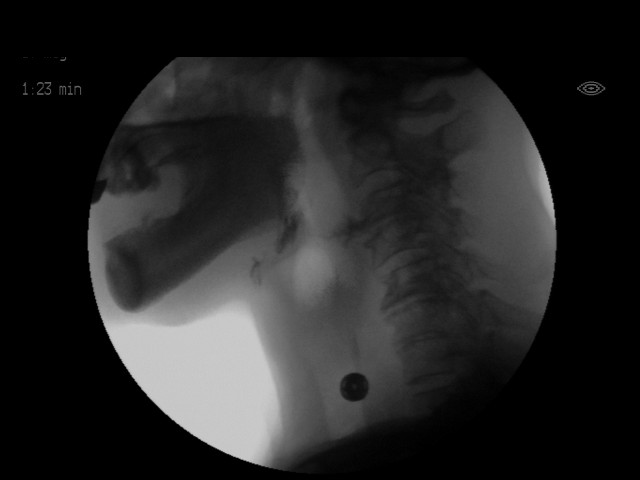
[im 18/23]
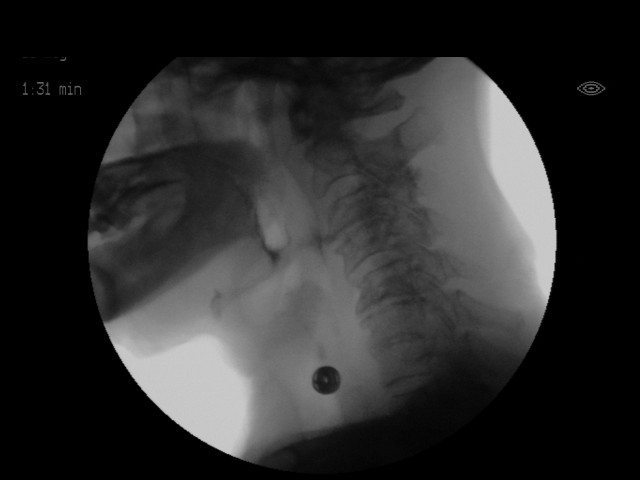
[im 19/23]
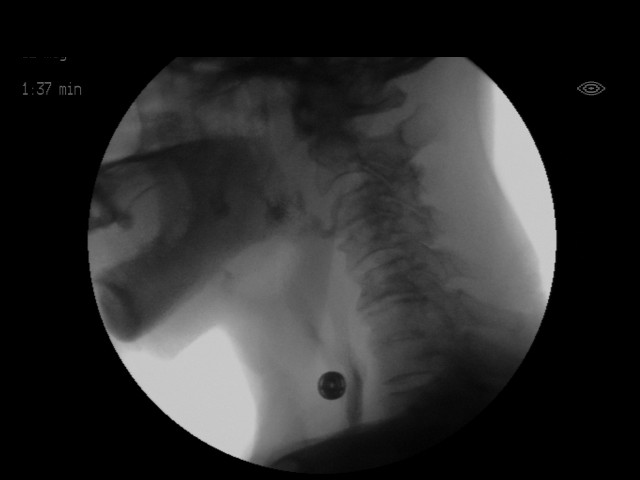
[im 20/23]
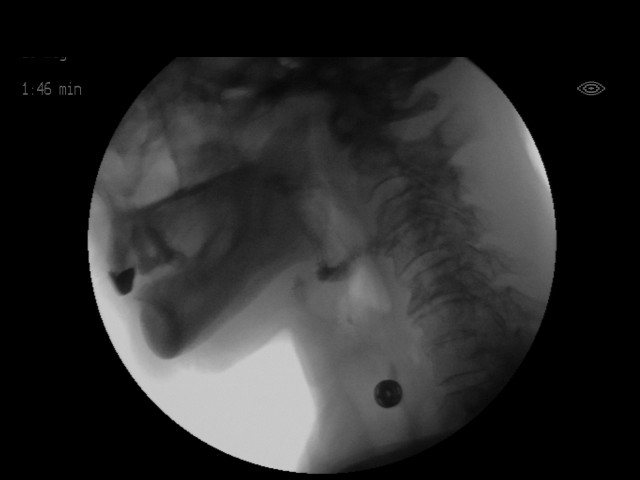
[im 22/23]
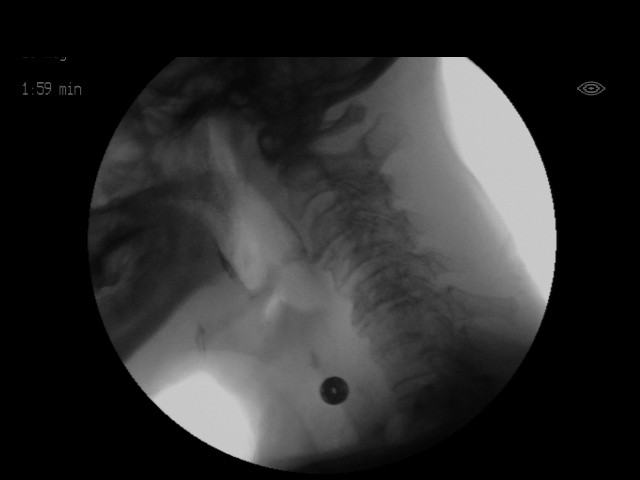
[im 23/23]
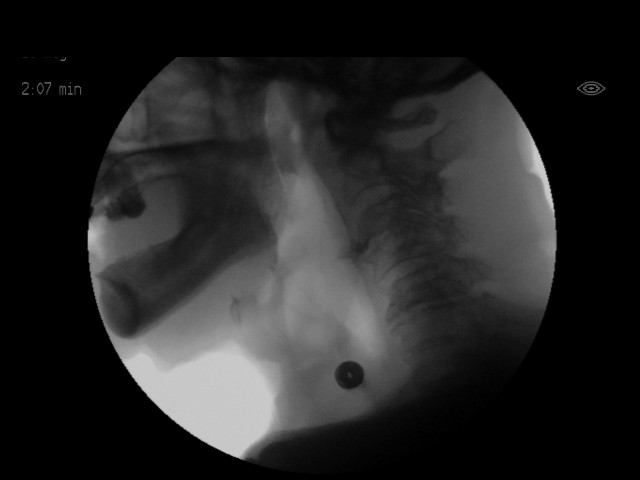

[18 of 24 positions shown; findings below may reference images not displayed]

FLUOROSCOPY FOR SWALLOWING FUNCTION STUDY:
Fluoroscopy was provided for swallowing function study, which was administered by a speech pathologist.  Final results and recommendations from this study are contained within the speech pathology report.

## 2018-04-11 IMAGING — CR DG CHEST 2V
2 series · 2 of 2 positions shown · non-contrast
Comparison: 11/08/2015.

CLINICAL DATA: Shortness of breath. Swallowing function examination
immediately prior to the current examination.

EXAM:
CHEST  2 VIEW

[chest lat]
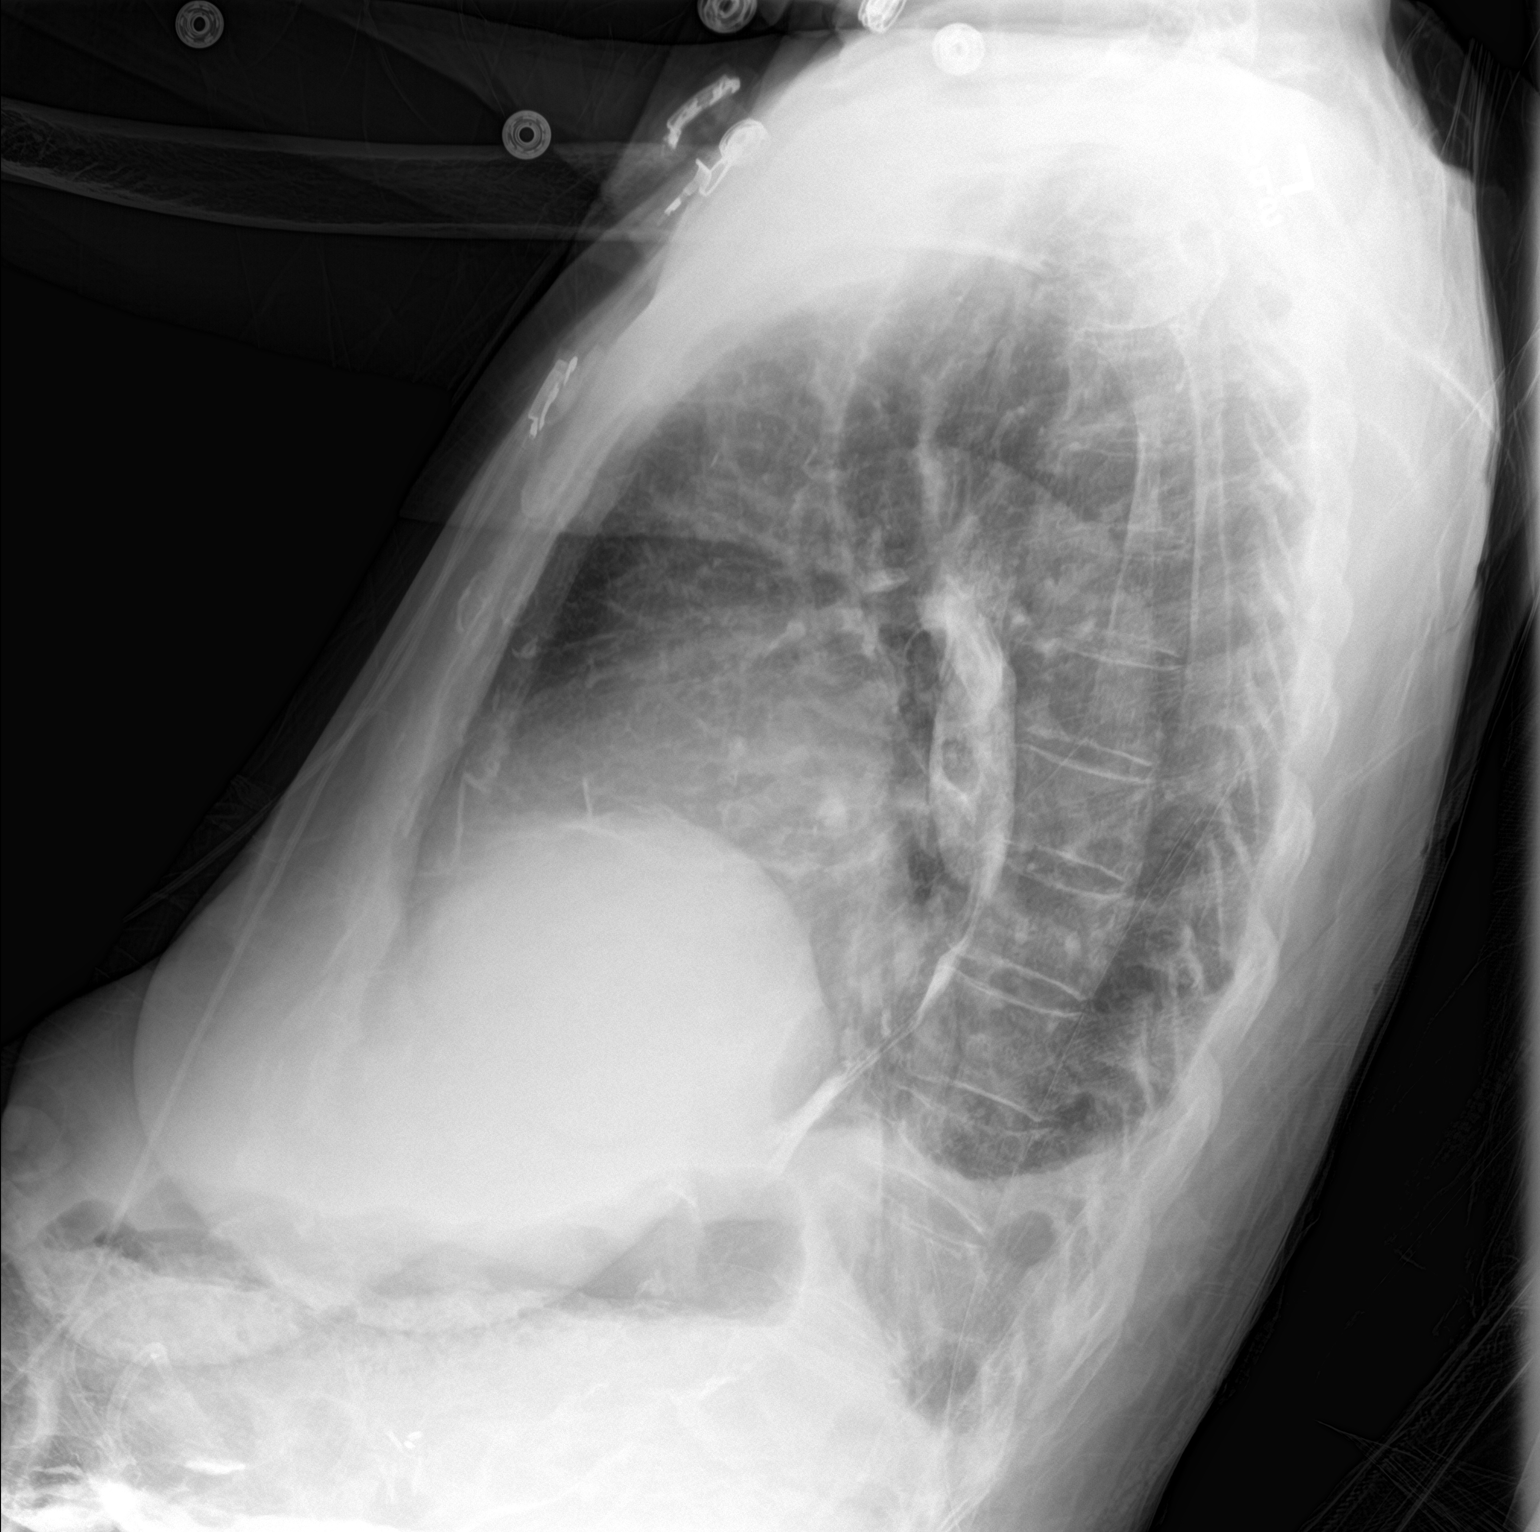

[chest ap]
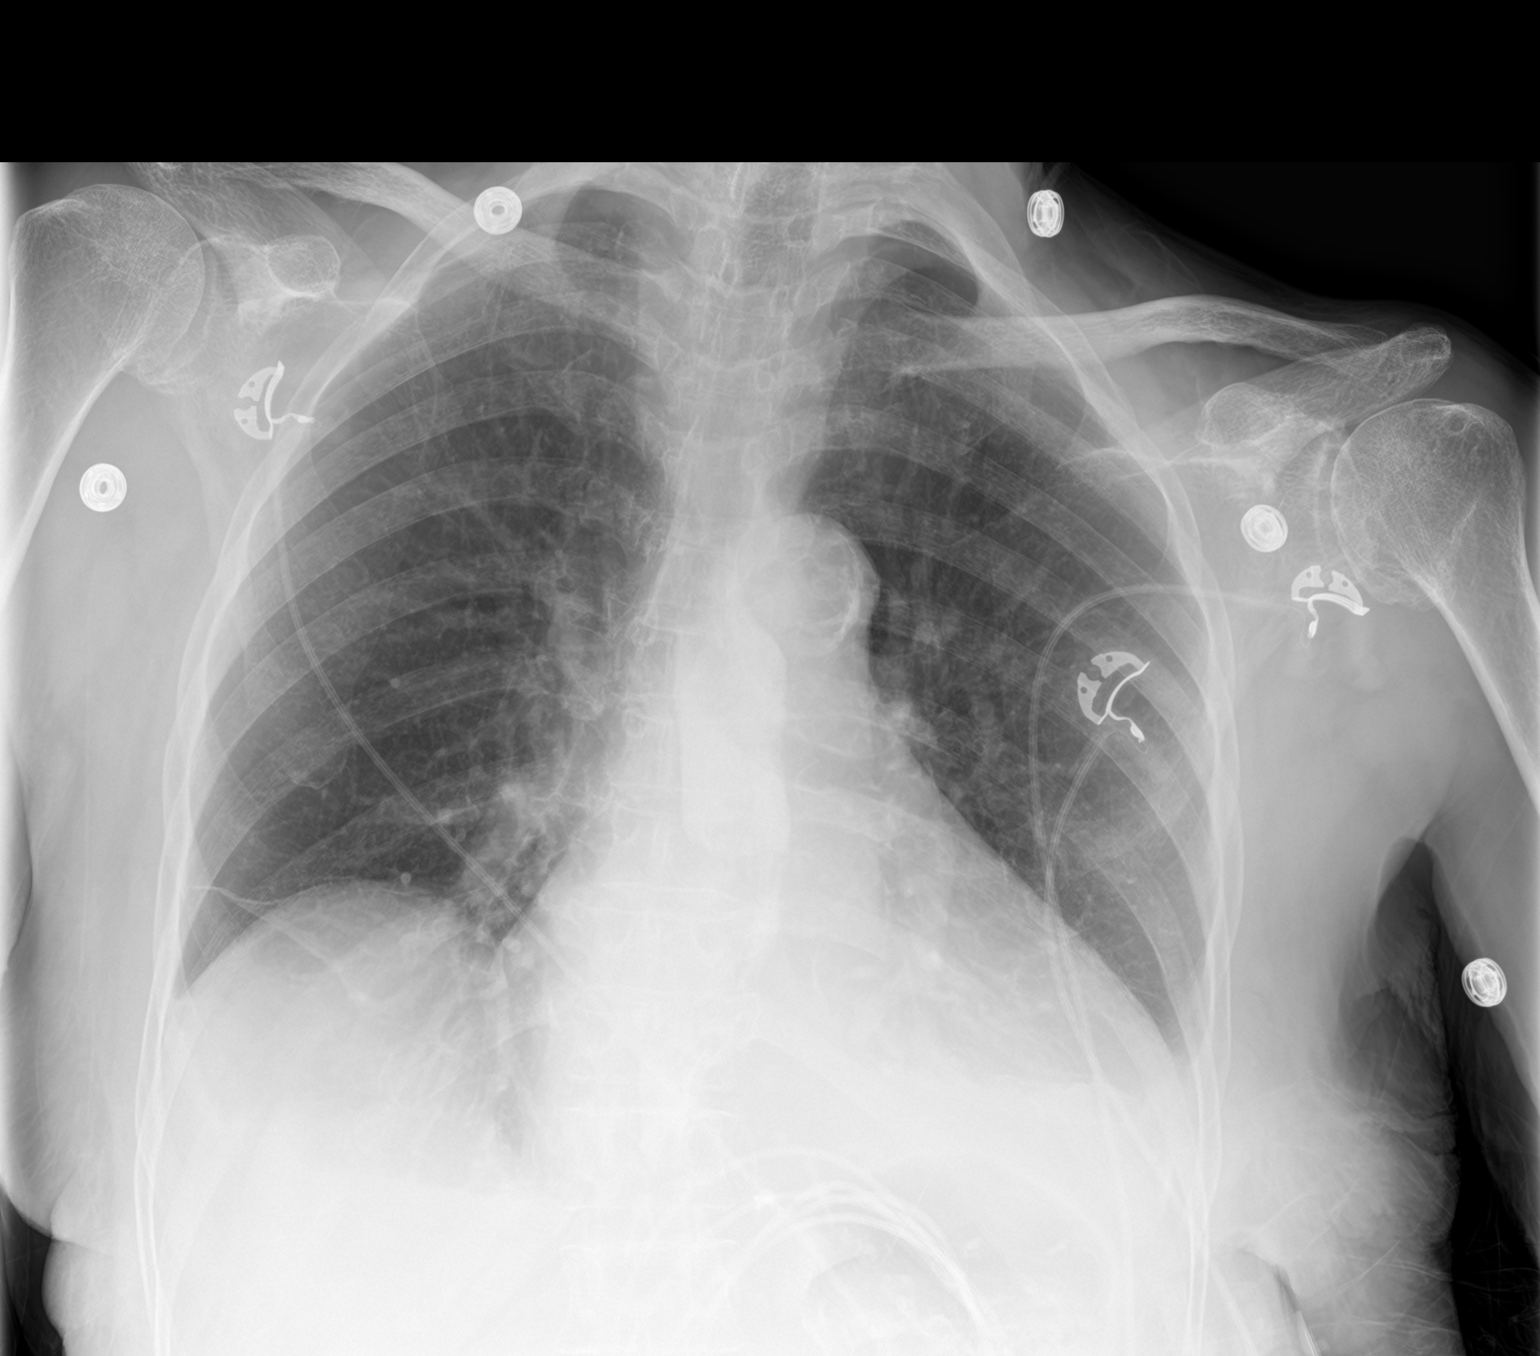

[2 of 2 positions shown; findings below may reference images not displayed]

FINDINGS: Stable enlarged cardiac silhouette and linear scarring at the right
lung base. Small bilateral pleural effusions are again demonstrated.
There is retained barium in the esophagus. Aortic calcifications are
noted as well as bilateral shoulder degenerative changes.
IMPRESSION: 1. Stable small bilateral pleural effusions and cardiomegaly.
2. Retained barium in the esophagus. This is most likely due to
esophageal dysmotility. Reflux could also produce this appearance.

## 2018-04-11 NOTE — Telephone Encounter (Signed)
Dr. Amalia Hailey ordered Aquacel Ag for daily dressing x 6 weeks to right hallux ulcer L97.512, E08.59 measuring 1.5 x 1.0 x 0.3cm with low exudate, from [facility]. Orders faxed to [facility].

## 2018-04-11 NOTE — Telephone Encounter (Signed)
-----   Message from Edrick Kins, DPM sent at 04/10/2018  2:29 PM EST ----- Regarding: Aquacel Ag dressing supply orders Please order Aquacel Ag dressings for patient.   Dx: ulcer RT hallux 1.5 x 1.0 x 0.3 cm. Diabetes mellitus. PVD.   Daily x 6 weeks.   Thanks, Dr. Amalia Hailey

## 2018-04-12 ENCOUNTER — Other Ambulatory Visit: Payer: Self-pay | Admitting: Cardiology

## 2018-04-14 NOTE — Progress Notes (Signed)
Subjective:  85 year old female presenting today for follow up evaluation of an ulceration to the right hallux. She states she is doing well. She reports the wound seems to be healing appropriately and denies any significant pain or modifying factors. She has been wearing the post op shoe as directed. Patient is here for further evaluation and treatment.   Past Medical History:  Diagnosis Date  . Acute respiratory failure ([place])   . Anemia    previous blood transfusions  . Arthritis    "all over"  . Asthma   . Bradycardia    requiring previous d/c of BB and reduction of amiodarone  . CAD (coronary artery disease)    nonobstructive per notes  . Chronic diastolic CHF (congestive heart failure) ([place])   . CKD (chronic kidney disease), stage III ([place])   . Complication of blood transfusion    "got the wrong blood type at [facility] in ~ 2015; no adverse reaction that we are aware of"/daughter, [name] (01/27/2016)  . COPD (chronic obstructive pulmonary disease) ([place])   . Depression    "light case"  . DVT (deep venous thrombosis) ([place]) 01/2016   a. LLE DVT 01/2016 - switched from Eliquis to Coumadin.  . Gastric stenosis    a. s/p stomach tube  . GERD (gastroesophageal reflux disease)   . History of blood transfusion    "several" (01/27/2016)  . History of stomach ulcers   . Hyperlipidemia   . Hypertension   . Hypothyroidism   . On home oxygen therapy    "2L; 9PM til 9AM" (06/27/2017)  . Paraesophageal hernia   . Perforated gastric ulcer ([place])   . Pneumonia    "a few times" (06/27/2017)  . Seasonal allergies   . SIADH (syndrome of inappropriate ADH production) ([place])    [name] 01/10/2015  . Small bowel obstruction ([place])    "I don't know how many" (01/11/2015)  . Stroke ([place])    "light one"  . Type II diabetes mellitus ([place])    "related to prednisone use  for > 20 yr; once predinose stopped; no more DM RX" (01/27/2016)  . UTI (urinary tract infection) 02/08/2016  . Ventral hernia  with bowel obstruction       Objective/Physical Exam General: The patient is alert and oriented x3 in no acute distress.  Dermatology:  Wound #1 noted to the right hallux measuring 1.5 x 1.0 x 0.3 cm (LxWxD).   To the above-noted ulceration, there is no eschar. There is a moderate amount of slough, fibrin and necrotic tissue. Granulation tissue and wound base is red. There is no malodor. There is a minimal amount of serosanginous drainage noted. Periwound integrity is intact. Heavy maceration noted.   Vascular: Nonpalpable pedal pulses bilaterally. No edema or erythema noted.   Neurological: Epicritic and protective threshold diminished bilaterally.   Musculoskeletal Exam: Range of motion within normal limits to all pedal and ankle joints bilateral. Muscle strength 5/5 in all groups bilateral.   Assessment: #1 ulceration of the right hallux secondary to diabetes mellitus  #2 diabetes mellitus w/ peripheral neuropathy   Plan of Care:  #1 Patient was evaluated.  #2 Medically necessary excisional debridement including subcutaneous tissue was performed using a tissue nipper and a chisel blade. Excisional debridement of all the necrotic nonviable tissue down to healthy bleeding viable tissue was performed with post-debridement measurements same as pre-. #3 The wound was cleansed and dry sterile dressing applied. #4 Continue using [facility] dressings daily.  #5 Orders placed  for Aquacel Ag.  #6 Continue using post op shoe.  #7 Return to clinic in 3 weeks.    Edrick Kins, DPM [facility]  Dr. Edrick Kins, [place]                                        [place], [place] [address]                Office [phone]  Fax [phone]

## 2018-04-17 ENCOUNTER — Other Ambulatory Visit: Payer: Self-pay | Admitting: Nurse Practitioner

## 2018-04-17 ENCOUNTER — Encounter: Payer: Self-pay | Admitting: Nurse Practitioner

## 2018-04-17 ENCOUNTER — Ambulatory Visit (INDEPENDENT_AMBULATORY_CARE_PROVIDER_SITE_OTHER): Payer: Medicare Other | Admitting: Nurse Practitioner

## 2018-04-17 ENCOUNTER — Ambulatory Visit (INDEPENDENT_AMBULATORY_CARE_PROVIDER_SITE_OTHER): Payer: Medicare Other | Admitting: *Deleted

## 2018-04-17 VITALS — BP 152/60 | HR 60 | Temp 97.8°F | Wt 149.6 lb

## 2018-04-17 DIAGNOSIS — E038 Other specified hypothyroidism: Secondary | ICD-10-CM

## 2018-04-17 DIAGNOSIS — E119 Type 2 diabetes mellitus without complications: Secondary | ICD-10-CM

## 2018-04-17 DIAGNOSIS — Z5181 Encounter for therapeutic drug level monitoring: Secondary | ICD-10-CM | POA: Diagnosis not present

## 2018-04-17 DIAGNOSIS — L97512 Non-pressure chronic ulcer of other part of right foot with fat layer exposed: Secondary | ICD-10-CM | POA: Diagnosis not present

## 2018-04-17 DIAGNOSIS — I4892 Unspecified atrial flutter: Secondary | ICD-10-CM

## 2018-04-17 DIAGNOSIS — J449 Chronic obstructive pulmonary disease, unspecified: Secondary | ICD-10-CM

## 2018-04-17 DIAGNOSIS — I739 Peripheral vascular disease, unspecified: Secondary | ICD-10-CM | POA: Diagnosis not present

## 2018-04-17 DIAGNOSIS — Z931 Gastrostomy status: Secondary | ICD-10-CM | POA: Diagnosis not present

## 2018-04-17 DIAGNOSIS — G8929 Other chronic pain: Secondary | ICD-10-CM

## 2018-04-17 DIAGNOSIS — M159 Polyosteoarthritis, unspecified: Secondary | ICD-10-CM

## 2018-04-17 DIAGNOSIS — N183 Chronic kidney disease, stage 3 unspecified: Secondary | ICD-10-CM

## 2018-04-17 DIAGNOSIS — M25512 Pain in left shoulder: Secondary | ICD-10-CM | POA: Diagnosis not present

## 2018-04-17 DIAGNOSIS — D638 Anemia in other chronic diseases classified elsewhere: Secondary | ICD-10-CM

## 2018-04-17 DIAGNOSIS — M8949 Other hypertrophic osteoarthropathy, multiple sites: Secondary | ICD-10-CM

## 2018-04-17 DIAGNOSIS — M150 Primary generalized (osteo)arthritis: Secondary | ICD-10-CM

## 2018-04-17 DIAGNOSIS — I4819 Other persistent atrial fibrillation: Secondary | ICD-10-CM | POA: Diagnosis not present

## 2018-04-17 DIAGNOSIS — I70235 Atherosclerosis of native arteries of right leg with ulceration of other part of foot: Secondary | ICD-10-CM

## 2018-04-17 LAB — POCT INR: INR: 2 (ref 2.0–3.0)

## 2018-04-17 MED ORDER — SERTRALINE HCL 100 MG PO TABS: ORAL_TABLET | ORAL | 1 refills | Status: DC

## 2018-04-17 NOTE — Progress Notes (Signed)
Careteam: Patient Care Team: [name], NP as PCP - General (Geriatric Medicine) Edrick Kins, DPM as Consulting Physician (Podiatry) Dorothy Spark, MD as Consulting Physician (Cardiology) [name], NP as Nurse Practitioner (Geriatric Medicine) Camillo Flaming, OD as Referring Physician (Optometry)  Advanced Directive information    Allergies  Allergen Reactions  . Penicillins Itching, Rash and Other (See Comments)    Tolerated unasyn, zosyn & cephalosporins in the past.  Has patient had a PCN reaction causing immediate rash, facial/tongue/throat swelling, SOB or lightheadedness with hypotension: Yes Has patient had a PCN reaction causing severe rash involving mucus membranes or skin necrosis: Unk Has patient had a PCN reaction that required hospitalization: Unk Has patient had a PCN reaction occurring within the last 10 years: Unk If all of the above answers are "NO", then may proceed with Cephalosporins  . Other     Patient tolerates amoxicillin and cephalosporins    Chief Complaint  Patient presents with  . Medical Management of Chronic Issues    Patient here today with her daugter [name]. She is on a feeding tube but seems as though everytime she eats she has to go to the bathroom with watery stools. She has been to the bathroom 4 times today already. She is also losing weight. She has severe arthritis all over and in a lot of pain.      HPI: Patient is a 85 y.o. female seen in the office today for routine follow up.  Daughter thought she was losing weight but when she got on scale today it is around baseline.  Has had close follow up with vascular and podiatrist due to recurrent infection.  Had stents placed to right leg.  Infection to right toe.   HTN/afib/aflutter/hyperlipidemia/chronic dHF - BP stable on hydralazine, coreg and imdur. Cholesterol stable on lipitor. Rate controlled on coreg.  She takes coumadin for anticoagulation. INR Followed  by cardio Dr Meda Coffee. GOAL INR 2-3. EF 60-65%  GOO - s/p GJ tube. FT changed by IR. Takes Protonix BID. Constipation stable on lactulose. She gets TF at night  and free water flushes (230 ml) BID. Drinks liquids po. Takes zofran prn. Was trying to add more protein but then her Tube stopped up. Only can take TF per tube. All pills PO. If she eats certain things PO then it stops her GJ tube.   Hyponatremia/ hx SIADH- stable. she was taking salt tabs but noticed the entire tab in her stool intermittently and no longer takes it. Na 137  DM - previously diet controlled but now on insulin tx (novolog SSI and lantus qhs). A1c 7%. Takes gabapentin for neuropathy; LDL 23  Hx CVA - stable. Takes coumadin daily. No bloody stools. INR Goal 2-3  Depression - mood stable on sertraline  Arthritis - stable on roxicodone- will use 1/2 tablet rarely but still uses. She does take gabapentin for neuropathy. Tramadol is ineffective and she had increased confusion on norco. Tylenol ineffective. Had cortisone shot in shoulder that was not effective. OA in shoulder and neck and head.   Hypothyroidism - stable on levothyroxine 137 mcg daily. TSH 0.67  Hx Pressure sore/right 3rd toe osteomyelitis -she noticed more bleeding from right toe and plans to f/u with podiatry. She completed 1 month of augmentin for osteo. 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 Followed by podiatry.   PAD - stable. s/p left SFA stent 09/21/16. ABI on 09/24/16 showed  right 0.86; left 0.96. followed by vascular sx.  COPD/asthma -  stable on dulera, xopenex hfa, duonebs. Followed by pulmonary.   Review of Systems:  Review of Systems  Constitutional: Negative for chills, fever, malaise/fatigue and weight loss.  Respiratory: Negative for cough, sputum production and shortness of breath.   Cardiovascular: Positive for leg swelling (improved). Negative for chest pain and palpitations.  Gastrointestinal:  Positive for diarrhea. Negative for abdominal pain, constipation and heartburn.  Genitourinary: Negative for dysuria, frequency and urgency.  Musculoskeletal: Positive for joint pain and neck pain. Negative for back pain, falls and myalgias.  Skin:       Dressing to right foot  Neurological: Negative for dizziness and headaches.  Psychiatric/Behavioral: Negative for depression and memory loss. The patient does not have insomnia.     Past Medical History:  Diagnosis Date  . Acute respiratory failure ([place])   . Anemia    previous blood transfusions  . Arthritis    "all over"  . Asthma   . Bradycardia    requiring previous d/c of BB and reduction of amiodarone  . CAD (coronary artery disease)    nonobstructive per notes  . Chronic diastolic CHF (congestive heart failure) ([place])   . CKD (chronic kidney disease), stage III ([place])   . Complication of blood transfusion    "got the wrong blood type at [facility] in ~ 2015; no adverse reaction that we are aware of"/daughter, [name] (01/27/2016)  . COPD (chronic obstructive pulmonary disease) ([place])   . Depression    "light case"  . DVT (deep venous thrombosis) ([place]) 01/2016   a. LLE DVT 01/2016 - switched from Eliquis to Coumadin.  . Gastric stenosis    a. s/p stomach tube  . GERD (gastroesophageal reflux disease)   . History of blood transfusion    "several" (01/27/2016)  . History of stomach ulcers   . Hyperlipidemia   . Hypertension   . Hypothyroidism   . On home oxygen therapy    "2L; 9PM til 9AM" (06/27/2017)  . Paraesophageal hernia   . Perforated gastric ulcer ([place])   . Pneumonia    "a few times" (06/27/2017)  . Seasonal allergies   . SIADH (syndrome of inappropriate ADH production) ([place])    [name] 01/10/2015  . Small bowel obstruction ([place])    "I don't know how many" (01/11/2015)  . Stroke ([place])    "light one"  . Type II diabetes mellitus ([place])    "related to prednisone use  for > 20 yr; once predinose stopped; no more DM  RX" (01/27/2016)  . UTI (urinary tract infection) 02/08/2016  . Ventral hernia with bowel obstruction    Past Surgical History:  Procedure Laterality Date  . ABDOMINAL AORTOGRAM N/A 09/21/2016   Procedure: Abdominal Aortogram;  Surgeon: Waynetta Sandy, MD;  Location: [place] [facility];  Service: Cardiovascular;  Laterality: N/A;  . AMPUTATION Left 09/25/2016   Procedure: AMPUTATION DIGIT- LEFT 2ND AND 3RD TOES;  Surgeon: Rosetta Posner, MD;  Location: [place];  Service: Vascular;  Laterality: Left;  . CATARACT EXTRACTION W/ INTRAOCULAR LENS  IMPLANT, BILATERAL    . CHOLECYSTECTOMY OPEN    . COLECTOMY    . ESOPHAGOGASTRODUODENOSCOPY N/A 01/19/2014   Procedure: ESOPHAGOGASTRODUODENOSCOPY (EGD);  Surgeon: Irene Shipper, MD;  Location: [facility];  Service: Endoscopy;  Laterality: N/A;  . ESOPHAGOGASTRODUODENOSCOPY N/A 01/20/2014   Procedure: ESOPHAGOGASTRODUODENOSCOPY (EGD);  Surgeon: Irene Shipper, MD;  Location: [facility];  Service: Endoscopy;  Laterality:  N/A;  . ESOPHAGOGASTRODUODENOSCOPY N/A 03/19/2014   Procedure: ESOPHAGOGASTRODUODENOSCOPY (EGD);  Surgeon: Milus Banister, MD;  Location: [facility];  Service: Endoscopy;  Laterality: N/A;  . ESOPHAGOGASTRODUODENOSCOPY N/A 07/08/2015   Procedure: ESOPHAGOGASTRODUODENOSCOPY (EGD);  Surgeon: Doran Stabler, MD;  Location: [facility];  Service: Endoscopy;  Laterality: N/A;  . ESOPHAGOGASTRODUODENOSCOPY (EGD) WITH PROPOFOL N/A 09/15/2015   Procedure: ESOPHAGOGASTRODUODENOSCOPY (EGD) WITH PROPOFOL;  Surgeon: Manus Gunning, MD;  Location: [facility];  Service: Gastroenterology;  Laterality: N/A;  . GASTROJEJUNOSTOMY     hx/notes 01/10/2015  . GASTROJEJUNOSTOMY N/A 09/23/2015   Procedure: OPEN GASTROJEJUNOSTOMY TUBE PLACEMENT;  Surgeon: Arta Bruce Kinsinger, MD;  Location: [facility];  Service: General;  Laterality: N/A;  . GLAUCOMA SURGERY Bilateral   . HERNIA REPAIR  2015  . IR CM INJ ANY COLONIC TUBE W/FLUORO  01/05/2017    . IR CM INJ ANY COLONIC TUBE W/FLUORO  06/07/2017  . IR CM INJ ANY COLONIC TUBE W/FLUORO  11/05/2017  . IR GENERIC HISTORICAL  01/07/2016   IR GJ TUBE CHANGE 01/07/2016 Jacqulynn Cadet, MD [facility]  . IR GENERIC HISTORICAL  01/27/2016   IR MECH REMOV OBSTRUC MAT ANY COLON TUBE W/FLUORO 01/27/2016 Markus Daft, MD [facility]  . IR GENERIC HISTORICAL  02/07/2016   IR PATIENT EVAL TECH 0-60 MINS Darrell K Allred, PA-C [facility]  . IR GENERIC HISTORICAL  02/08/2016   IR GJ TUBE CHANGE 02/08/2016 Greggory Keen, MD [facility]  . IR GENERIC HISTORICAL  01/06/2016   IR GJ TUBE CHANGE 01/06/2016 [facility] OUT REF  . IR GENERIC HISTORICAL  05/02/2016   IR CM INJ ANY COLONIC TUBE W/FLUORO 05/02/2016 Arne Cleveland, MD [facility]  . IR GENERIC HISTORICAL  05/15/2016   IR GJ TUBE CHANGE 05/15/2016 Sandi Mariscal, MD [facility]  . IR GENERIC HISTORICAL  06/28/2016   IR GJ TUBE CHANGE 06/28/2016 [facility]  . IR GJ TUBE CHANGE  02/20/2017  . IR GJ TUBE CHANGE  05/10/2017  . IR GJ TUBE CHANGE  07/06/2017  . IR GJ TUBE CHANGE  08/02/2017  . IR GJ TUBE CHANGE  10/15/2017  . IR GJ TUBE CHANGE  12/26/2017  . IR [place] TUBE CHANGE  04/08/2018  . IR PATIENT EVAL TECH 0-60 MINS  10/19/2016  . IR PATIENT EVAL TECH 0-60 MINS  12/25/2016  . IR PATIENT EVAL TECH 0-60 MINS  01/29/2017  . IR PATIENT EVAL TECH 0-60 MINS  04/04/2017  . IR PATIENT EVAL TECH 0-60 MINS  04/30/2017  . IR PATIENT EVAL TECH 0-60 MINS  07/31/2017  . IR REPLC DUODEN/JEJUNO TUBE PERCUT W/FLUORO  11/14/2016  . LAPAROTOMY N/A 01/20/2015   Procedure: EXPLORATORY LAPAROTOMY;  Surgeon: Coralie Keens, MD;  Location: [place];  Service: General;  Laterality: N/A;  . LOWER EXTREMITY ANGIOGRAPHY Left 09/21/2016   Procedure: Lower Extremity Angiography;  Surgeon: Waynetta Sandy, MD;  Location: [place];  Service: Cardiovascular;  Laterality: Left;  . LOWER EXTREMITY ANGIOGRAPHY Right 06/27/2017   Procedure: Lower Extremity Angiography;  Surgeon:  Waynetta Sandy, MD;  Location: [place];  Service: Cardiovascular;  Laterality: Right;  . LYSIS OF ADHESION N/A 01/20/2015   Procedure: LYSIS OF ADHESIONS < 1 HOUR;  Surgeon: Coralie Keens, MD;  Location: [place];  Service: General;  Laterality: N/A;  . PERIPHERAL VASCULAR BALLOON ANGIOPLASTY Left 09/21/2016   Procedure: Peripheral Vascular Balloon Angioplasty;  Surgeon: Waynetta Sandy, MD;  Location: [place] [facility];  Service:  Cardiovascular;  Laterality: Left;  drug coated balloon  . PERIPHERAL VASCULAR BALLOON ANGIOPLASTY Right 06/27/2017   Procedure: PERIPHERAL VASCULAR BALLOON ANGIOPLASTY;  Surgeon: Waynetta Sandy, MD;  Location: [place] [facility];  Service: Cardiovascular;  Laterality: Right;  SFA/TPTRUNK  . TONSILLECTOMY    . TUBAL LIGATION    . VENTRAL HERNIA REPAIR  2015   incarcerated ventral hernia ([facility] 09/2013)/notes 01/10/2015   Social History:   reports that she has never smoked. She has quit using smokeless tobacco.  Her smokeless tobacco use included snuff. She reports that she does not drink alcohol or use drugs.  Family History  Problem Relation Age of Onset  . Stroke Mother   . Hypertension Mother   . Diabetes Brother   . Glaucoma Son   . Glaucoma Son   . Heart attack Neg Hx   . Colon cancer Neg Hx   . Stomach cancer Neg Hx     Medications: Patient's Medications  New Prescriptions   No medications on file  Previous Medications   ACETAMINOPHEN (TYLENOL) 500 MG TABLET    Take 1,000 mg by mouth every 6 (six) hours as needed (pain).   AMINO ACIDS-PROTEIN HYDROLYS PO    Take 30 mLs by mouth 2 (two) times daily.   ATORVASTATIN (LIPITOR) 10 MG TABLET    TAKE 1 TABLET BY MOUTH EVERY DAY   B COMPLEX VITAMINS TABLET    Take 1 tablet by mouth daily.    CALCIUM-VITAMIN D (OSCAL WITH D) 500-200 MG-UNIT PER TABLET    Take 1 tablet by mouth daily with breakfast.    CARVEDILOL (COREG) 6.25 MG TABLET    Take 1 tablet (6.25 mg total)  by mouth 2 (two) times daily.   COLLAGENASE (SANTYL) OINTMENT    Apply 1 application topically daily.   CYCLOSPORINE (RESTASIS) 0.05 % OPHTHALMIC EMULSION    USE 1 DROP INTO BOTH EYES TWICE DAILY   DOXYCYCLINE (VIBRA-TABS) 100 MG TABLET    Take 1 tablet (100 mg total) by mouth 2 (two) times daily.   FEEDING SUPPLEMENT (OSMOLITE 1 CAL) LIQD    Take 711 mLs by mouth at bedtime.    FERROUS SULFATE 325 (65 FE) MG TABLET    TAKE ONE TABLET BY MOUTH ONCE DAILY WITH BREAKFAST   FLUTICASONE (FLONASE) 50 MCG/ACT NASAL SPRAY    Spray 2 sprays into each nostril once daily   FUROSEMIDE (LASIX) 40 MG TABLET    TAKE 1 TABLET BY MOUTH EVERY DAY   GABAPENTIN (NEURONTIN) 300 MG CAPSULE    TAKE 2 CAPSULES (600 MG TOTAL) BY MOUTH 3 (THREE) TIMES DAILY.   GENTAMICIN CREAM (GARAMYCIN) 0.1 %    Apply 1 application topically 3 (three) times daily.   HYDRALAZINE (APRESOLINE) 50 MG TABLET    Take 50 mg by mouth 3 (three) times daily.   INSULIN ASPART (NOVOLOG FLEXPEN) 100 UNIT/ML FLEXPEN    Take three times daily before each meal based on sliding scale 140-199( 2 units), 200-250 (4 units), 251-299 (6 units), 300-349 (8 units), > 350(10 units)   INSULIN GLARGINE (LANTUS SOLOSTAR) 100 UNIT/ML SOLOSTAR PEN    INJECT 10 UNITS DAILY TO CONTROL BLOOD SUGAR   INSULIN PEN NEEDLE (B-D UF III MINI PEN NEEDLES) 31G X 5 MM MISC    Use twice daily with giving insulin injections. Dx E11.9   IPRATROPIUM-ALBUTEROL (DUONEB) 0.5-2.5 (3) MG/3ML SOLN    Take 3 mLs by nebulization every 6 (six) hours as needed (sob). Use 3 times daily  x 4 days then every 6 hours as needed.   ISOSORBIDE MONONITRATE (IMDUR) 30 MG 24 HR TABLET    TAKE 2 TABLETS (60 MG TOTAL) BY MOUTH DAILY.   LATANOPROST (XALATAN) 0.005 % OPHTHALMIC SOLUTION    Place 1 drop into both eyes at bedtime.   LEVALBUTEROL (XOPENEX HFA) 45 MCG/ACT INHALER    Inhale 2 puffs into the lungs every 6 (six) hours as needed for wheezing or shortness of breath.   LEVOTHYROXINE (SYNTHROID,  LEVOTHROID) 137 MCG TABLET    TAKE 1 TABLET (137 MCG TOTAL) BY MOUTH DAILY BEFORE BREAKFAST.   MOMETASONE-FORMOTEROL (DULERA) 200-5 MCG/ACT AERO    Take two puffs by mouth twice daily   MULTIPLE VITAMIN (MULTIVITAMIN WITH MINERALS) TABS TABLET    Take 1 tablet by mouth daily.   NYSTATIN (MYCOSTATIN/NYSTOP) POWDER    Apply topically 3 (three) times daily.   OXYCODONE (ROXICODONE) 5 MG IMMEDIATE RELEASE TABLET    Take 1 tablet (5 mg total) by mouth every 4 (four) hours as needed for severe pain. May take 1/2 tab q4hrs as needed for mild pain.   OXYGEN    Inhale 2 L into the lungs at bedtime.   PANTOPRAZOLE (PROTONIX) 40 MG TABLET    TAKE 1 TABLET BY MOUTH TWICE A DAY   POLYETHYL GLYCOL-PROPYL GLYCOL 0.4-0.3 % SOLN    Place 1 drop into both eyes 4 (four) times daily.    SERTRALINE (ZOLOFT) 100 MG TABLET    TAKE 1 TABLET BY MOUTH EVERY DAY FOR DEPRESSION   WARFARIN (COUMADIN) 2.5 MG TABLET    TAKE AS DIRECTED BY COUMADIN CLINIC.   WATER FOR IRRIGATION, STERILE (FREE WATER) SOLN    Place 60 mLs into feeding tube 3 (three) times daily.    WHITE PETROLATUM-MINERAL OIL (SYSTANE NIGHTTIME) OINT    Apply 1 application to eye at bedtime. Both eyes  Modified Medications   No medications on file  Discontinued Medications   No medications on file     Physical Exam:  Vitals:   04/17/18 1432  BP: (!) 152/60  Pulse: 60  Temp: 97.8 F (36.6 C)  SpO2: 97%  Weight: 149 lb 9.6 oz (67.9 kg)   Body mass index is 24.15 kg/m.  Physical Exam Constitutional:      Appearance: She is well-developed.  HENT:     Head: Normocephalic and atraumatic.  Eyes:     Pupils: Pupils are equal, round, and reactive to light.  Neck:     Musculoskeletal: Normal range of motion and neck supple.  Cardiovascular:     Rate and Rhythm: Normal rate and regular rhythm.     Heart sounds: Murmur present.  Pulmonary:     Effort: Pulmonary effort is normal.     Breath sounds: Normal breath sounds.  Abdominal:     General:  Bowel sounds are normal.     Palpations: Abdomen is soft.     Comments: PEG tube  Musculoskeletal:     Right lower leg: No edema.     Left lower leg: No edema.  Skin:    General: Skin is warm and dry.     Findings: No erythema.     Comments: Right toes covered in bandage and with orthotic in place  Neurological:     Mental Status: She is alert and oriented to person, place, and time.     Labs reviewed: Basic Metabolic Panel: Recent Labs    08/01/17 1039 08/07/17 1119 12/04/17 1524  NA 136 136  136  K 4.7 4.7 4.6  CL 100 98 98  CO2 30 25 33*  GLUCOSE 125* 70 81  BUN 65* 52* 69*  CREATININE 1.53* 1.33* 1.44*  CALCIUM 9.3 9.2 9.7  TSH 3.67 4.880*  --    Liver Function Tests: Recent Labs    08/01/17 1039 08/07/17 1119 12/04/17 1524  AST 20 22 21   ALT 14 15 16   ALKPHOS  --  99  --   BILITOT 0.3 0.2 0.3  PROT 8.0 8.1 7.8  ALBUMIN  --  4.1  --    No results for input(s): LIPASE, AMYLASE in the last 8760 hours. No results for input(s): AMMONIA in the last 8760 hours. CBC: Recent Labs    08/01/17 1039 08/07/17 1119 10/05/17 1047  WBC 6.3 5.8 6.7  NEUTROABS 3,975 3.3 4,402  HGB 7.9* 8.6* 9.4*  HCT 24.3* 26.9* 29.7*  MCV 86.5 88 88.9  PLT 288 331 265   Lipid Panel: Recent Labs    08/01/17 1039  CHOL 97  HDL 53  LDLCALC 23  TRIG 120  CHOLHDL 1.8   TSH: Recent Labs    08/01/17 1039 08/07/17 1119  TSH 3.67 4.880*   A1C: Lab Results  Component Value Date   HGBA1C 6.8 (H) 12/04/2017     Assessment/Plan 1. Diabetes mellitus type 2 in nonobese (HCC) -continues on lantus and novolog - COMPLETE METABOLIC PANEL WITH GFR - Hemoglobin A1c  2. Chronic left shoulder pain Worsening pain. Injection was not effective. Uses Oxy IR   3. Primary osteoarthritis involving multiple joints With increase in pain, uses Oxy IR 1/2 rarely for severe pain.   4. PEG (percutaneous endoscopic gastrostomy) status ([place]) stable  5. Persistent atrial  fibrillation Stable, ongoing follow up with coumadin clinic. Rate controlled  6. PAD (peripheral artery disease) (HCC) Stable, s/p RLE angiogram, drug coated balloon angioplasty right SFA and popliteal arteries with 28mm balloon, 71mm plain balloon angioplasty of tibioperoneal trunk on rightand closure of left common femoral artery for continuous access with minx deviceon 06/27/17 by Dr. Donzetta Matters.  7. Chronic kidney insufficiency, stage 3 (moderate) (HCC) - takes free water and nutrition via PEG, does drink protein supplements throughout the day. To avoid NSAIDS - COMPLETE METABOLIC PANEL WITH GFR  8. Anemia of chronic disease - CBC with Differential/Platelets  9. Other specified hypothyroidism -currentlky on synthroid  - TSH  10. Chronic ulcer of great toe of right foot with fat layer exposed ([place]) Following closely with podiatry with slow healing of wound. She was last seen on 04/10/18 with debridement and continues with silvercel dressing daily   11. COPD with asthma ([place]) Stable, continues on dulera BID and wears O2 at night  Next appt: 4 months for routine follow up.  [name]. [place], [facility] [phone]

## 2018-04-17 NOTE — Patient Instructions (Signed)
To call insurance and have them send [place] form for nursing care   To add probiotic daily for stools  Labs today  Follow up in 4 months, sooner if needed

## 2018-04-17 NOTE — Patient Instructions (Addendum)
Description   Continue on same dosage 2 tablets daily except 1 tablet on Fridays.  Recheck in 5 weeks.  Call Coumadin Clinic with any new medications [phone].

## 2018-04-18 ENCOUNTER — Other Ambulatory Visit: Payer: Self-pay | Admitting: Nurse Practitioner

## 2018-04-18 LAB — COMPLETE METABOLIC PANEL WITH GFR
AG Ratio: 1.1 (calc) (ref 1.0–2.5)
ALT: 18 U/L (ref 6–29)
AST: 22 U/L (ref 10–35)
Albumin: 3.9 g/dL (ref 3.6–5.1)
Alkaline phosphatase (APISO): 81 U/L (ref 33–130)
BUN/Creatinine Ratio: 46 (calc) — ABNORMAL HIGH (ref 6–22)
BUN: 68 mg/dL — ABNORMAL HIGH (ref 7–25)
CO2: 34 mmol/L — ABNORMAL HIGH (ref 20–32)
Calcium: 9.9 mg/dL (ref 8.6–10.4)
Chloride: 101 mmol/L (ref 98–110)
Creat: 1.49 mg/dL — ABNORMAL HIGH (ref 0.60–0.88)
GFR, Est African American: 35 mL/min/{1.73_m2} — ABNORMAL LOW (ref >=60)
GFR, Est Non African American: 30 mL/min/{1.73_m2} — ABNORMAL LOW (ref >=60)
Globulin: 3.4 g/dL (calc) (ref 1.9–3.7)
Glucose, Bld: 86 mg/dL (ref 65–139)
Potassium: 4.4 mmol/L (ref 3.5–5.3)
Sodium: 141 mmol/L (ref 135–146)
Total Bilirubin: 0.4 mg/dL (ref 0.2–1.2)
Total Protein: 7.3 g/dL (ref 6.1–8.1)

## 2018-04-18 LAB — CBC WITH DIFFERENTIAL/PLATELET
Absolute Monocytes: 633 cells/uL (ref 200–950)
Basophils Absolute: 57 cells/uL (ref 0–200)
Basophils Relative: 1 %
Eosinophils Absolute: 627 cells/uL — ABNORMAL HIGH (ref 15–500)
Eosinophils Relative: 11 %
HCT: 31.5 % — ABNORMAL LOW (ref 35.0–45.0)
Hemoglobin: 10.3 g/dL — ABNORMAL LOW (ref 11.7–15.5)
Lymphs Abs: 952 cells/uL (ref 850–3900)
MCH: 30.7 pg (ref 27.0–33.0)
MCHC: 32.7 g/dL (ref 32.0–36.0)
MCV: 93.8 fL (ref 80.0–100.0)
MPV: 12.7 fL — ABNORMAL HIGH (ref 7.5–12.5)
Monocytes Relative: 11.1 %
Neutro Abs: 3431 cells/uL (ref 1500–7800)
Neutrophils Relative %: 60.2 %
Platelets: 207 10*3/uL (ref 140–400)
RBC: 3.36 10*6/uL — ABNORMAL LOW (ref 3.80–5.10)
RDW: 14.2 % (ref 11.0–15.0)
Total Lymphocyte: 16.7 %
WBC: 5.7 10*3/uL (ref 3.8–10.8)

## 2018-04-18 LAB — HEMOGLOBIN A1C
Hgb A1c MFr Bld: 6.6 % of total Hgb — ABNORMAL HIGH (ref <5.7)
Mean Plasma Glucose: 143 (calc)
eAG (mmol/L): 7.9 (calc)

## 2018-04-18 LAB — TSH: TSH: 0.15 mIU/L — ABNORMAL LOW (ref 0.40–4.50)

## 2018-04-18 MED ORDER — LEVOTHYROXINE SODIUM 125 MCG PO TABS: 125.0000 ug | ORAL_TABLET | Freq: Every day | ORAL | 3 refills | Status: DC

## 2018-04-30 ENCOUNTER — Telehealth: Payer: Self-pay | Admitting: Podiatry

## 2018-04-30 NOTE — Telephone Encounter (Signed)
Pt's dtr, [name] states area is bleeding had been using silver, now have started using the iodine. I told [name] to continue the iodine and I would transfer her to schedulers for an appt tomorrow.

## 2018-04-30 NOTE — Telephone Encounter (Signed)
Ms. Armor right great toe and foot is swelling and bleeding. We have been putting some peroxide on it and bandage it with a gauze. Please call me back at [phone] or [phone].

## 2018-05-01 ENCOUNTER — Other Ambulatory Visit: Payer: Self-pay | Admitting: *Deleted

## 2018-05-01 ENCOUNTER — Telehealth: Payer: Self-pay | Admitting: *Deleted

## 2018-05-01 ENCOUNTER — Encounter: Payer: Self-pay | Admitting: Podiatry

## 2018-05-01 ENCOUNTER — Ambulatory Visit (INDEPENDENT_AMBULATORY_CARE_PROVIDER_SITE_OTHER): Payer: Medicare Other | Admitting: Podiatry

## 2018-05-01 ENCOUNTER — Ambulatory Visit (INDEPENDENT_AMBULATORY_CARE_PROVIDER_SITE_OTHER): Payer: Medicare Other

## 2018-05-01 DIAGNOSIS — I739 Peripheral vascular disease, unspecified: Secondary | ICD-10-CM

## 2018-05-01 DIAGNOSIS — L03031 Cellulitis of right toe: Secondary | ICD-10-CM | POA: Diagnosis not present

## 2018-05-01 DIAGNOSIS — I70235 Atherosclerosis of native arteries of right leg with ulceration of other part of foot: Secondary | ICD-10-CM

## 2018-05-01 DIAGNOSIS — E0859 Diabetes mellitus due to underlying condition with other circulatory complications: Secondary | ICD-10-CM

## 2018-05-01 DIAGNOSIS — L97512 Non-pressure chronic ulcer of other part of right foot with fat layer exposed: Secondary | ICD-10-CM

## 2018-05-01 DIAGNOSIS — M869 Osteomyelitis, unspecified: Secondary | ICD-10-CM | POA: Diagnosis not present

## 2018-05-01 MED ORDER — SULFAMETHOXAZOLE-TRIMETHOPRIM 800-160 MG PO TABS: 1.0000 | ORAL_TABLET | Freq: Two times a day (BID) | ORAL | 0 refills | Status: DC

## 2018-05-01 NOTE — Progress Notes (Signed)
Spoke with caregiver/daughter. Last Coumadin 04/30/2018 and instructed to hold for surgery . To be at [facility] admitting at 10:15 am on 05/03/2018. NPO past MN night prior. Expect a call and follow the detailed surgery instructions for medication, feeding tube and insulin adjustment from the hospital pre-admission department. Verbalized understanding.

## 2018-05-01 NOTE — Telephone Encounter (Signed)
Request for Surgical Clearance  1. What type of surgery is being performed?  AMPUTATION RIGHT GREAT TOE    2. When is this surgery scheduled? 05/03/2018  3. What type of clearance is required (medical clearance vs. Pharmacy clearance to hold med vs. Both)? PHARMACY    4. Are there any medications that need to be held prior to surgery and how long?  COUMADIN HOLD STARTING 05/01/2018 FOR SURGERY     5. Practice name and name of physician performing surgery?   [facility]    6.  What is your office phone number?    [phone] 7. What is your office fax number? (Be sure to include anyone who it needs to go Attn to) [phone]    8. Anesthesia type (None, local, MAC, general)? CHOICE   REMINDER TO USER: Remember to please route this message to P [facility] DIV PREOP in a phone note.

## 2018-05-01 NOTE — Telephone Encounter (Signed)
Becky - [facility] asked for information on a pt Dr. Amalia Hailey had called and referred to their office for surgery Friday. I asked Dr. Amalia Hailey and he said Shannon [name] was to be admitted to hospital through [facility] for surgery.

## 2018-05-01 NOTE — Telephone Encounter (Signed)
Left Message informing [facility] Becky of pt's information.

## 2018-05-02 ENCOUNTER — Encounter (HOSPITAL_COMMUNITY): Payer: Self-pay | Admitting: *Deleted

## 2018-05-02 ENCOUNTER — Other Ambulatory Visit: Payer: Self-pay

## 2018-05-02 NOTE — Progress Notes (Signed)
I spoke with Soledad Gerlach, Mrs Essner's daughter and caregiver.  Mrs Ghanem reports that patient is not complaining of chest pain, gets a little short of breath when she is doing some hoose care, ie  "putting pillow cases on pillows." Patient has a history of CopD and uses oxygen at hs. Patient has a history of DVT , last dose of Coumadin was 04/30/2018.  PCP is Dr Lenna Sciara. [name]. Cardiologist is Dr Meda Coffee, Patient has stage III Renal Failure, PCP watches it.  Mrs Houseman has Type II diabetes, Ms Tasia Catchings reports that am CBGs for the past 3 mornings were 309, 299, 290.  Patient has a G tube, feeding will be stopped by midnight if even started tonight, suggested patient start feedings early tonight, "that would require her going to bed early, she will not want to do that."  I encourage patient to have a hs snack.  I instruct Ms Tasia Catchings to give patient 5 units of Lantus tonight. I instructed patient to check CBG after awaking and every 2 hours until arrival  to the hospital.  I Instructed patient if CBG is less than 70 to drink of  1/2 cup of a clear juice. Recheck CBG in 15 minutes then call pre- op desk at [phone] for further instructions.  I gave instructions that if CBG> 220 that patient should have 1/2 of sliding scale.  I recommended that if CBG was 300 to call pre op desk and ask for instructions on how much insulin to take.

## 2018-05-03 ENCOUNTER — Encounter (HOSPITAL_COMMUNITY): Payer: Self-pay

## 2018-05-03 ENCOUNTER — Inpatient Hospital Stay (HOSPITAL_COMMUNITY): Payer: Medicare Other | Admitting: Anesthesiology

## 2018-05-03 ENCOUNTER — Other Ambulatory Visit: Payer: Self-pay

## 2018-05-03 ENCOUNTER — Inpatient Hospital Stay (HOSPITAL_COMMUNITY)
Admission: RE | Admit: 2018-05-03 | Discharge: 2018-05-09 | DRG: 255 | Disposition: A | Discharge status: Home / Self Care | Payer: Medicare Other | Attending: Internal Medicine | Admitting: Internal Medicine

## 2018-05-03 ENCOUNTER — Encounter (HOSPITAL_COMMUNITY)
Admission: RE | Disposition: A | Discharge status: Home / Self Care | Payer: Self-pay | Source: Home / Self Care | Attending: Nephrology

## 2018-05-03 DIAGNOSIS — Z9981 Dependence on supplemental oxygen: Secondary | ICD-10-CM

## 2018-05-03 DIAGNOSIS — Z86718 Personal history of other venous thrombosis and embolism: Secondary | ICD-10-CM

## 2018-05-03 DIAGNOSIS — L97509 Non-pressure chronic ulcer of other part of unspecified foot with unspecified severity: Secondary | ICD-10-CM

## 2018-05-03 DIAGNOSIS — Z961 Presence of intraocular lens: Secondary | ICD-10-CM | POA: Diagnosis present

## 2018-05-03 DIAGNOSIS — M1990 Unspecified osteoarthritis, unspecified site: Secondary | ICD-10-CM | POA: Diagnosis present

## 2018-05-03 DIAGNOSIS — Z931 Gastrostomy status: Secondary | ICD-10-CM | POA: Diagnosis not present

## 2018-05-03 DIAGNOSIS — I509 Heart failure, unspecified: Secondary | ICD-10-CM | POA: Diagnosis not present

## 2018-05-03 DIAGNOSIS — Z880 Allergy status to penicillin: Secondary | ICD-10-CM

## 2018-05-03 DIAGNOSIS — E222 Syndrome of inappropriate secretion of antidiuretic hormone: Secondary | ICD-10-CM | POA: Diagnosis present

## 2018-05-03 DIAGNOSIS — N183 Chronic kidney disease, stage 3 unspecified: Secondary | ICD-10-CM | POA: Diagnosis present

## 2018-05-03 DIAGNOSIS — I110 Hypertensive heart disease with heart failure: Secondary | ICD-10-CM | POA: Diagnosis not present

## 2018-05-03 DIAGNOSIS — L97519 Non-pressure chronic ulcer of other part of right foot with unspecified severity: Secondary | ICD-10-CM | POA: Diagnosis not present

## 2018-05-03 DIAGNOSIS — E1152 Type 2 diabetes mellitus with diabetic peripheral angiopathy with gangrene: Secondary | ICD-10-CM | POA: Diagnosis present

## 2018-05-03 DIAGNOSIS — I4891 Unspecified atrial fibrillation: Secondary | ICD-10-CM | POA: Diagnosis present

## 2018-05-03 DIAGNOSIS — Z8711 Personal history of peptic ulcer disease: Secondary | ICD-10-CM

## 2018-05-03 DIAGNOSIS — Z823 Family history of stroke: Secondary | ICD-10-CM

## 2018-05-03 DIAGNOSIS — N178 Other acute kidney failure: Secondary | ICD-10-CM | POA: Diagnosis not present

## 2018-05-03 DIAGNOSIS — N179 Acute kidney failure, unspecified: Secondary | ICD-10-CM | POA: Diagnosis present

## 2018-05-03 DIAGNOSIS — I739 Peripheral vascular disease, unspecified: Secondary | ICD-10-CM | POA: Diagnosis not present

## 2018-05-03 DIAGNOSIS — S98111A Complete traumatic amputation of right great toe, initial encounter: Secondary | ICD-10-CM | POA: Diagnosis not present

## 2018-05-03 DIAGNOSIS — I482 Chronic atrial fibrillation, unspecified: Secondary | ICD-10-CM

## 2018-05-03 DIAGNOSIS — E785 Hyperlipidemia, unspecified: Secondary | ICD-10-CM | POA: Diagnosis present

## 2018-05-03 DIAGNOSIS — E11621 Type 2 diabetes mellitus with foot ulcer: Secondary | ICD-10-CM | POA: Diagnosis present

## 2018-05-03 DIAGNOSIS — Z87891 Personal history of nicotine dependence: Secondary | ICD-10-CM

## 2018-05-03 DIAGNOSIS — J9621 Acute and chronic respiratory failure with hypoxia: Secondary | ICD-10-CM | POA: Diagnosis present

## 2018-05-03 DIAGNOSIS — I4892 Unspecified atrial flutter: Secondary | ICD-10-CM | POA: Diagnosis present

## 2018-05-03 DIAGNOSIS — E1122 Type 2 diabetes mellitus with diabetic chronic kidney disease: Secondary | ICD-10-CM | POA: Diagnosis present

## 2018-05-03 DIAGNOSIS — Z8673 Personal history of transient ischemic attack (TIA), and cerebral infarction without residual deficits: Secondary | ICD-10-CM

## 2018-05-03 DIAGNOSIS — I5032 Chronic diastolic (congestive) heart failure: Secondary | ICD-10-CM | POA: Diagnosis present

## 2018-05-03 DIAGNOSIS — E039 Hypothyroidism, unspecified: Secondary | ICD-10-CM | POA: Diagnosis not present

## 2018-05-03 DIAGNOSIS — Z7901 Long term (current) use of anticoagulants: Secondary | ICD-10-CM | POA: Diagnosis not present

## 2018-05-03 DIAGNOSIS — I130 Hypertensive heart and chronic kidney disease with heart failure and stage 1 through stage 4 chronic kidney disease, or unspecified chronic kidney disease: Secondary | ICD-10-CM | POA: Diagnosis present

## 2018-05-03 DIAGNOSIS — I2510 Atherosclerotic heart disease of native coronary artery without angina pectoris: Secondary | ICD-10-CM | POA: Diagnosis present

## 2018-05-03 DIAGNOSIS — J449 Chronic obstructive pulmonary disease, unspecified: Secondary | ICD-10-CM | POA: Diagnosis not present

## 2018-05-03 DIAGNOSIS — Z79899 Other long term (current) drug therapy: Secondary | ICD-10-CM

## 2018-05-03 DIAGNOSIS — D62 Acute posthemorrhagic anemia: Secondary | ICD-10-CM | POA: Diagnosis not present

## 2018-05-03 DIAGNOSIS — Z794 Long term (current) use of insulin: Secondary | ICD-10-CM

## 2018-05-03 DIAGNOSIS — E1169 Type 2 diabetes mellitus with other specified complication: Secondary | ICD-10-CM | POA: Diagnosis present

## 2018-05-03 DIAGNOSIS — Z79890 Hormone replacement therapy (postmenopausal): Secondary | ICD-10-CM

## 2018-05-03 DIAGNOSIS — R1310 Dysphagia, unspecified: Secondary | ICD-10-CM | POA: Diagnosis present

## 2018-05-03 DIAGNOSIS — E1142 Type 2 diabetes mellitus with diabetic polyneuropathy: Secondary | ICD-10-CM | POA: Diagnosis present

## 2018-05-03 DIAGNOSIS — Z833 Family history of diabetes mellitus: Secondary | ICD-10-CM

## 2018-05-03 DIAGNOSIS — K219 Gastro-esophageal reflux disease without esophagitis: Secondary | ICD-10-CM | POA: Diagnosis present

## 2018-05-03 DIAGNOSIS — I96 Gangrene, not elsewhere classified: Secondary | ICD-10-CM | POA: Diagnosis not present

## 2018-05-03 DIAGNOSIS — Z9842 Cataract extraction status, left eye: Secondary | ICD-10-CM

## 2018-05-03 DIAGNOSIS — Z7951 Long term (current) use of inhaled steroids: Secondary | ICD-10-CM

## 2018-05-03 DIAGNOSIS — M86171 Other acute osteomyelitis, right ankle and foot: Secondary | ICD-10-CM | POA: Diagnosis present

## 2018-05-03 DIAGNOSIS — I10 Essential (primary) hypertension: Secondary | ICD-10-CM | POA: Diagnosis not present

## 2018-05-03 DIAGNOSIS — Z8249 Family history of ischemic heart disease and other diseases of the circulatory system: Secondary | ICD-10-CM

## 2018-05-03 DIAGNOSIS — E1151 Type 2 diabetes mellitus with diabetic peripheral angiopathy without gangrene: Secondary | ICD-10-CM | POA: Diagnosis not present

## 2018-05-03 DIAGNOSIS — Z9841 Cataract extraction status, right eye: Secondary | ICD-10-CM

## 2018-05-03 HISTORY — PX: AMPUTATION: SHX166

## 2018-05-03 HISTORY — DX: Peripheral vascular disease, unspecified: I73.9

## 2018-05-03 HISTORY — DX: Dyspnea, unspecified: R06.00

## 2018-05-03 LAB — GLUCOSE, CAPILLARY
Glucose-Capillary: 110 mg/dL — ABNORMAL HIGH (ref 70–99)
Glucose-Capillary: 147 mg/dL — ABNORMAL HIGH (ref 70–99)
Glucose-Capillary: 179 mg/dL — ABNORMAL HIGH (ref 70–99)
Glucose-Capillary: 100 mg/dL — ABNORMAL HIGH (ref 70–99)

## 2018-05-03 LAB — COMPREHENSIVE METABOLIC PANEL
ALT: 34 U/L (ref 0–44)
AST: 42 U/L — ABNORMAL HIGH (ref 15–41)
Albumin: 3.1 g/dL — ABNORMAL LOW (ref 3.5–5.0)
Alkaline Phosphatase: 59 U/L (ref 38–126)
Anion gap: 13 (ref 5–15)
BUN: 84 mg/dL — ABNORMAL HIGH (ref 8–23)
CO2: 25 mmol/L (ref 22–32)
Calcium: 9.6 mg/dL (ref 8.9–10.3)
Chloride: 104 mmol/L (ref 98–111)
Creatinine, Ser: 2.18 mg/dL — ABNORMAL HIGH (ref 0.44–1.00)
GFR calc Af Amer: 22 mL/min — ABNORMAL LOW (ref >60)
GFR calc non Af Amer: 19 mL/min — ABNORMAL LOW (ref >60)
Glucose, Bld: 114 mg/dL — ABNORMAL HIGH (ref 70–99)
Potassium: 4.5 mmol/L (ref 3.5–5.1)
Sodium: 142 mmol/L (ref 135–145)
Total Bilirubin: 1 mg/dL (ref 0.3–1.2)
Total Protein: 7.6 g/dL (ref 6.5–8.1)

## 2018-05-03 LAB — SURGICAL PCR SCREEN
MRSA, PCR: NEGATIVE
Staphylococcus aureus: NEGATIVE

## 2018-05-03 LAB — PROTIME-INR
INR: 2.08
Prothrombin Time: 23.2 seconds — ABNORMAL HIGH (ref 11.4–15.2)

## 2018-05-03 LAB — CBC
HCT: 27.6 % — ABNORMAL LOW (ref 36.0–46.0)
Hemoglobin: 8.8 g/dL — ABNORMAL LOW (ref 12.0–15.0)
MCH: 29.7 pg (ref 26.0–34.0)
MCHC: 31.9 g/dL (ref 30.0–36.0)
MCV: 93.2 fL (ref 80.0–100.0)
Platelets: 230 10*3/uL (ref 150–400)
RBC: 2.96 MIL/uL — ABNORMAL LOW (ref 3.87–5.11)
RDW: 15.6 % — ABNORMAL HIGH (ref 11.5–15.5)
WBC: 8.5 10*3/uL (ref 4.0–10.5)
nRBC: 0.2 % (ref 0.0–0.2)

## 2018-05-03 SURGERY — AMPUTATION DIGIT
Anesthesia: Regional | Site: Foot | Laterality: Right

## 2018-05-03 MED ORDER — MORPHINE SULFATE (PF) 2 MG/ML IV SOLN: 2.0000 mg | INTRAVENOUS | Status: DC | PRN

## 2018-05-03 MED ORDER — ATORVASTATIN CALCIUM 10 MG PO TABS
10.0000 mg | ORAL_TABLET | Freq: Every day | ORAL | Status: DC
  Administered 2018-05-03 – 2018-05-09 (×7): 10 mg via ORAL
  Filled 2018-05-03 (×7): qty 1

## 2018-05-03 MED ORDER — SODIUM CHLORIDE 0.9 % IV SOLN: INTRAVENOUS | Status: DC

## 2018-05-03 MED ORDER — OXYCODONE HCL 5 MG PO TABS: 5.0000 mg | ORAL_TABLET | ORAL | Status: DC | PRN

## 2018-05-03 MED ORDER — ACETAMINOPHEN 500 MG PO TABS: 500.0000 mg | ORAL_TABLET | Freq: Four times a day (QID) | ORAL | Status: DC | PRN

## 2018-05-03 MED ORDER — SYSTANE NIGHTTIME OP OINT: 1.0000 "application " | TOPICAL_OINTMENT | Freq: Every day | OPHTHALMIC | Status: DC

## 2018-05-03 MED ORDER — WARFARIN SODIUM 5 MG PO TABS
5.0000 mg | ORAL_TABLET | Freq: Once | ORAL | Status: AC
  Administered 2018-05-03: 5 mg via ORAL
  Filled 2018-05-03 (×2): qty 1

## 2018-05-03 MED ORDER — CHLORHEXIDINE GLUCONATE 4 % EX LIQD: 60.0000 mL | Freq: Once | CUTANEOUS | Status: DC

## 2018-05-03 MED ORDER — FERROUS SULFATE 325 (65 FE) MG PO TABS
325.0000 mg | ORAL_TABLET | Freq: Every day | ORAL | Status: DC
  Administered 2018-05-03 – 2018-05-09 (×7): 325 mg via ORAL
  Filled 2018-05-03 (×7): qty 1

## 2018-05-03 MED ORDER — CALCIUM CARBONATE-VITAMIN D 500-200 MG-UNIT PO TABS
1.0000 | ORAL_TABLET | Freq: Every day | ORAL | Status: DC
  Administered 2018-05-04 – 2018-05-09 (×6): 1 via ORAL
  Filled 2018-05-03 (×6): qty 1

## 2018-05-03 MED ORDER — LIDOCAINE HCL (PF) 1 % IJ SOLN
INTRAMUSCULAR | Status: AC
  Filled 2018-05-03: qty 30

## 2018-05-03 MED ORDER — HYDRALAZINE HCL 50 MG PO TABS
50.0000 mg | ORAL_TABLET | Freq: Three times a day (TID) | ORAL | Status: DC
  Administered 2018-05-03 – 2018-05-09 (×17): 50 mg via ORAL
  Filled 2018-05-03 (×18): qty 1

## 2018-05-03 MED ORDER — LEVALBUTEROL HCL 0.63 MG/3ML IN NEBU: 0.6300 mg | INHALATION_SOLUTION | Freq: Four times a day (QID) | RESPIRATORY_TRACT | Status: DC | PRN

## 2018-05-03 MED ORDER — FENTANYL CITRATE (PF) 100 MCG/2ML IJ SOLN: 25.0000 ug | INTRAMUSCULAR | Status: DC | PRN

## 2018-05-03 MED ORDER — IPRATROPIUM-ALBUTEROL 0.5-2.5 (3) MG/3ML IN SOLN: 3.0000 mL | Freq: Four times a day (QID) | RESPIRATORY_TRACT | Status: DC | PRN

## 2018-05-03 MED ORDER — INSULIN ASPART 100 UNIT/ML ~~LOC~~ SOLN
0.0000 [IU] | Freq: Three times a day (TID) | SUBCUTANEOUS | Status: DC
  Administered 2018-05-03 – 2018-05-04 (×2): 2 [IU] via SUBCUTANEOUS
  Administered 2018-05-05: 3 [IU] via SUBCUTANEOUS
  Administered 2018-05-05: 2 [IU] via SUBCUTANEOUS
  Administered 2018-05-07 – 2018-05-08 (×3): 1 [IU] via SUBCUTANEOUS

## 2018-05-03 MED ORDER — 0.9 % SODIUM CHLORIDE (POUR BTL) OPTIME
TOPICAL | Status: DC | PRN
  Administered 2018-05-03: 1000 mL

## 2018-05-03 MED ORDER — VANCOMYCIN HCL IN DEXTROSE 1-5 GM/200ML-% IV SOLN
1000.0000 mg | INTRAVENOUS | Status: AC
  Administered 2018-05-03: 1000 mg via INTRAVENOUS
  Filled 2018-05-03: qty 200

## 2018-05-03 MED ORDER — MIDAZOLAM HCL 2 MG/2ML IJ SOLN
INTRAMUSCULAR | Status: AC
  Filled 2018-05-03: qty 2

## 2018-05-03 MED ORDER — SODIUM CHLORIDE 0.9 % IV SOLN
INTRAVENOUS | Status: DC
  Administered 2018-05-03 – 2018-05-04 (×2): via INTRAVENOUS

## 2018-05-03 MED ORDER — OSMOLITE 1 CAL PO LIQD: 711.0000 mL | ORAL | Status: DC

## 2018-05-03 MED ORDER — INSULIN GLARGINE 100 UNIT/ML ~~LOC~~ SOLN
5.0000 [IU] | Freq: Every day | SUBCUTANEOUS | Status: DC
  Administered 2018-05-03 – 2018-05-08 (×6): 5 [IU] via SUBCUTANEOUS
  Filled 2018-05-03 (×8): qty 0.05

## 2018-05-03 MED ORDER — FENTANYL CITRATE (PF) 100 MCG/2ML IJ SOLN
50.0000 ug | Freq: Once | INTRAMUSCULAR | Status: AC
  Administered 2018-05-03: 50 ug via INTRAVENOUS

## 2018-05-03 MED ORDER — INSULIN ASPART 100 UNIT/ML ~~LOC~~ SOLN: 0.0000 [IU] | Freq: Every day | SUBCUTANEOUS | Status: DC

## 2018-05-03 MED ORDER — ARTIFICIAL TEARS OPHTHALMIC OINT
TOPICAL_OINTMENT | Freq: Every day | OPHTHALMIC | Status: DC
  Administered 2018-05-03 – 2018-05-05 (×3): via OPHTHALMIC
  Administered 2018-05-06 – 2018-05-07 (×2): 1 via OPHTHALMIC
  Administered 2018-05-08: 23:00:00 via OPHTHALMIC
  Filled 2018-05-03: qty 3.5

## 2018-05-03 MED ORDER — LATANOPROST 0.005 % OP SOLN
1.0000 [drp] | Freq: Every day | OPHTHALMIC | Status: DC
  Administered 2018-05-03 – 2018-05-08 (×6): 1 [drp] via OPHTHALMIC
  Filled 2018-05-03: qty 2.5

## 2018-05-03 MED ORDER — ADULT MULTIVITAMIN W/MINERALS CH
1.0000 | ORAL_TABLET | Freq: Every day | ORAL | Status: DC
  Administered 2018-05-03 – 2018-05-09 (×7): 1 via ORAL
  Filled 2018-05-03 (×7): qty 1

## 2018-05-03 MED ORDER — LACTATED RINGERS IV SOLN
INTRAVENOUS | Status: DC
  Administered 2018-05-03: 11:00:00 via INTRAVENOUS

## 2018-05-03 MED ORDER — CYCLOSPORINE 0.05 % OP EMUL
1.0000 [drp] | Freq: Two times a day (BID) | OPHTHALMIC | Status: DC
  Administered 2018-05-03 – 2018-05-09 (×13): 1 [drp] via OPHTHALMIC
  Filled 2018-05-03 (×14): qty 1

## 2018-05-03 MED ORDER — GABAPENTIN 300 MG PO CAPS
300.0000 mg | ORAL_CAPSULE | Freq: Every day | ORAL | Status: DC
  Administered 2018-05-03 – 2018-05-08 (×6): 300 mg via ORAL
  Filled 2018-05-03 (×6): qty 1

## 2018-05-03 MED ORDER — WARFARIN - PHARMACIST DOSING INPATIENT
Freq: Every day | Status: DC
  Administered 2018-05-03 – 2018-05-08 (×4)

## 2018-05-03 MED ORDER — OSMOLITE 1 CAL PO LIQD
711.0000 mL | ORAL | Status: DC
  Filled 2018-05-03: qty 711

## 2018-05-03 MED ORDER — GABAPENTIN 300 MG PO CAPS: 600.0000 mg | ORAL_CAPSULE | Freq: Three times a day (TID) | ORAL | Status: DC

## 2018-05-03 MED ORDER — PANTOPRAZOLE SODIUM 40 MG PO TBEC
40.0000 mg | DELAYED_RELEASE_TABLET | Freq: Two times a day (BID) | ORAL | Status: DC
  Administered 2018-05-03 – 2018-05-09 (×13): 40 mg via ORAL
  Filled 2018-05-03 (×13): qty 1

## 2018-05-03 MED ORDER — ISOSORBIDE MONONITRATE ER 60 MG PO TB24
60.0000 mg | ORAL_TABLET | Freq: Every day | ORAL | Status: DC
  Administered 2018-05-03 – 2018-05-09 (×7): 60 mg via ORAL
  Filled 2018-05-03 (×7): qty 1

## 2018-05-03 MED ORDER — SULFAMETHOXAZOLE-TRIMETHOPRIM 800-160 MG PO TABS: 1.0000 | ORAL_TABLET | Freq: Two times a day (BID) | ORAL | Status: DC

## 2018-05-03 MED ORDER — LEVOTHYROXINE SODIUM 25 MCG PO TABS
125.0000 ug | ORAL_TABLET | Freq: Every day | ORAL | Status: DC
  Administered 2018-05-04 – 2018-05-09 (×6): 125 ug via ORAL
  Filled 2018-05-03 (×6): qty 1

## 2018-05-03 MED ORDER — OSMOLITE 1.2 CAL PO LIQD
711.0000 mL | ORAL | Status: DC
  Administered 2018-05-03: 711 mL via ORAL
  Filled 2018-05-03: qty 711

## 2018-05-03 MED ORDER — LEVALBUTEROL TARTRATE 45 MCG/ACT IN AERO: 2.0000 | INHALATION_SPRAY | Freq: Four times a day (QID) | RESPIRATORY_TRACT | Status: DC | PRN

## 2018-05-03 MED ORDER — MOMETASONE FURO-FORMOTEROL FUM 200-5 MCG/ACT IN AERO
2.0000 | INHALATION_SPRAY | Freq: Two times a day (BID) | RESPIRATORY_TRACT | Status: DC
  Administered 2018-05-03 – 2018-05-09 (×11): 2 via RESPIRATORY_TRACT
  Filled 2018-05-03: qty 8.8

## 2018-05-03 MED ORDER — ONDANSETRON HCL 4 MG/2ML IJ SOLN: 4.0000 mg | Freq: Once | INTRAMUSCULAR | Status: DC | PRN

## 2018-05-03 MED ORDER — LORATADINE 10 MG PO TABS: 10.0000 mg | ORAL_TABLET | Freq: Every day | ORAL | Status: DC | PRN

## 2018-05-03 MED ORDER — LIDOCAINE-EPINEPHRINE (PF) 1.5 %-1:200000 IJ SOLN
INTRAMUSCULAR | Status: DC | PRN
  Administered 2018-05-03: 28 mL via PERINEURAL

## 2018-05-03 MED ORDER — FENTANYL CITRATE (PF) 100 MCG/2ML IJ SOLN
INTRAMUSCULAR | Status: AC
  Administered 2018-05-03: 50 ug via INTRAVENOUS
  Filled 2018-05-03: qty 2

## 2018-05-03 MED ORDER — CARVEDILOL 6.25 MG PO TABS
6.2500 mg | ORAL_TABLET | Freq: Two times a day (BID) | ORAL | Status: DC
  Administered 2018-05-03 – 2018-05-09 (×12): 6.25 mg via ORAL
  Filled 2018-05-03 (×13): qty 1

## 2018-05-03 MED ORDER — POLYVINYL ALCOHOL 1.4 % OP SOLN
1.0000 [drp] | Freq: Four times a day (QID) | OPHTHALMIC | Status: DC
  Administered 2018-05-03 – 2018-05-09 (×23): 1 [drp] via OPHTHALMIC
  Filled 2018-05-03: qty 15

## 2018-05-03 SURGICAL SUPPLY — 34 items
BANDAGE ELASTIC 4 VELCRO ST LF (GAUZE/BANDAGES/DRESSINGS) ×2 IMPLANT
BNDG GAUZE ELAST 4 BULKY (GAUZE/BANDAGES/DRESSINGS) ×2 IMPLANT
CANISTER SUCT 3000ML PPV (MISCELLANEOUS) ×2 IMPLANT
CLIP LIGATING EXTRA MED SLVR (CLIP) ×2 IMPLANT
CLIP LIGATING EXTRA SM BLUE (MISCELLANEOUS) ×2 IMPLANT
COVER SURGICAL LIGHT HANDLE (MISCELLANEOUS) ×4 IMPLANT
COVER WAND RF STERILE (DRAPES) ×2 IMPLANT
DRAPE EXTREMITY T 121X128X90 (DISPOSABLE) ×2 IMPLANT
DRSG EMULSION OIL 3X3 NADH (GAUZE/BANDAGES/DRESSINGS) ×2 IMPLANT
ELECT REM PT RETURN 9FT ADLT (ELECTROSURGICAL) ×2
ELECTRODE REM PT RTRN 9FT ADLT (ELECTROSURGICAL) ×1 IMPLANT
GAUZE SPONGE 4X4 12PLY STRL (GAUZE/BANDAGES/DRESSINGS) ×2 IMPLANT
GAUZE SPONGE 4X4 12PLY STRL LF (GAUZE/BANDAGES/DRESSINGS) ×2 IMPLANT
GLOVE SS BIOGEL STRL SZ 7.5 (GLOVE) ×1 IMPLANT
GLOVE SUPERSENSE BIOGEL SZ 7.5 (GLOVE) ×1
GOWN STRL REUS W/ TWL LRG LVL3 (GOWN DISPOSABLE) ×3 IMPLANT
GOWN STRL REUS W/TWL LRG LVL3 (GOWN DISPOSABLE) ×3
KIT BASIN OR (CUSTOM PROCEDURE TRAY) ×2 IMPLANT
KIT TURNOVER KIT B (KITS) ×2 IMPLANT
NEEDLE 22X1 1/2 (OR ONLY) (NEEDLE) IMPLANT
NS IRRIG 1000ML POUR BTL (IV SOLUTION) ×2 IMPLANT
PACK GENERAL/GYN (CUSTOM PROCEDURE TRAY) ×2 IMPLANT
PAD ARMBOARD 7.5X6 YLW CONV (MISCELLANEOUS) ×4 IMPLANT
SUT ETHILON 3 0 PS 1 (SUTURE) ×2 IMPLANT
SUT SILK 2 0 (SUTURE) ×1
SUT SILK 2-0 18XBRD TIE 12 (SUTURE) ×1 IMPLANT
SUT VIC AB 3-0 SH 27 (SUTURE) ×2
SUT VIC AB 3-0 SH 27X BRD (SUTURE) ×2 IMPLANT
SYR BULB IRRIGATION 50ML (SYRINGE) ×2 IMPLANT
SYR CONTROL 10ML LL (SYRINGE) IMPLANT
TOWEL GREEN STERILE (TOWEL DISPOSABLE) ×4 IMPLANT
TUBE CONNECTING 20X1/4 (TUBING) ×2 IMPLANT
UNDERPAD 30X30 (UNDERPADS AND DIAPERS) ×2 IMPLANT
WATER STERILE IRR 1000ML POUR (IV SOLUTION) ×2 IMPLANT

## 2018-05-03 NOTE — Progress Notes (Signed)
Received pt from PACU. Patient alert and oriented. No complaints of pain at this time. ACE wrap on right foot clean, dry and intact. Patient's daughter at bedside. Will continue to monitor.

## 2018-05-03 NOTE — Progress Notes (Signed)
ANTICOAGULATION CONSULT NOTE - Initial Consult  Pharmacy Consult for warfarin Indication: atrial fibrillation  Allergies  Allergen Reactions  . Penicillins Itching, Rash and Other (See Comments)    Tolerated amoxicillin, unasyn, zosyn & cephalosporins in the past. Did it involve swelling of the face/tongue/throat, SOB, or low BP? No Did it involve sudden or severe rash/hives, skin peeling, or any reaction on the inside of your mouth or nose? No Did you need to seek medical attention at a hospital or doctor's office? Unknown When did it last happen?5+ years If all above answers are "NO", may proceed with cephalosporin use.     Patient Measurements: Height: 5\' 7"  (170.2 cm) Weight: 145 lb (65.8 kg) IBW/kg (Calculated) : 61.6   Vital Signs: Temp: 97.2 F (36.2 C) (01/31 1345) Temp Source: Oral (01/31 1104) BP: 158/71 (01/31 1445) Pulse Rate: 68 (01/31 1445)  Labs: Recent Labs    05/03/18 1055  HGB 8.8*  HCT 27.6*  PLT 230  LABPROT 23.2*  INR 2.08  CREATININE 2.18*    Estimated Creatinine Clearance: 16 mL/min (A) (by C-G formula based on SCr of 2.18 mg/dL (H)).   Medical History: Past Medical History:  Diagnosis Date  . Acute respiratory failure ([place])   . Anemia    previous blood transfusions  . Arthritis    "all over"  . Asthma   . Bradycardia    requiring previous d/c of BB and reduction of amiodarone  . CAD (coronary artery disease)    nonobstructive per notes  . Chronic diastolic CHF (congestive heart failure) ([place])   . CKD (chronic kidney disease), stage III ([place])   . Complication of blood transfusion    "got the wrong blood type at [facility] in ~ 2015; no adverse reaction that we are aware of"/daughter, [name] (01/27/2016)  . COPD (chronic obstructive pulmonary disease) ([place])   . Depression    "light case"  . DVT (deep venous thrombosis) ([place]) 01/2016   a. LLE DVT 01/2016 - switched from Eliquis to Coumadin.  [name] Dyspnea    with some activity   . Gastric stenosis    a. s/p stomach tube  . GERD (gastroesophageal reflux disease)   . History of blood transfusion    "several" (01/27/2016)  . History of stomach ulcers   . Hyperlipidemia   . Hypertension   . Hypothyroidism   . On home oxygen therapy    "2L; 9PM til 9AM" (06/27/2017)  . Paraesophageal hernia   . Perforated gastric ulcer ([place])   . Peripheral vascular disease ([place])   . Pneumonia    "a few times" (06/27/2017)  . Seasonal allergies   . SIADH (syndrome of inappropriate ADH production) ([place])    [name] 01/10/2015  . Small bowel obstruction ([place])    "I don't know how many" (01/11/2015)  . Stroke [facility])    several- no residual  . Type II diabetes mellitus ([place])    "related to prednisone use  for > 20 yr; once predinose stopped; no more DM RX" (01/27/2016)  . UTI (urinary tract infection) 02/08/2016  . Ventral hernia with bowel obstruction    Assessment: 85 yo female s/p toe amputation. Patient on warfarin PTA for atrial fibrillation. Pharmacy consulted to restart warfarin. Patient's last dose of warfarin was on 1/28 and takes 5mg  daily except 2.5mg  on Fridays. INR today 2.08.   Caution will need to be exercised if patient is to be resumed on bactrim therapy on discharge.   Goal of Therapy:  INR  2-3 Monitor platelets by anticoagulation protocol: Yes   Plan:  Warfarin 5mg  PO x 1 tonight Daily INR Monitor for bleeding  Afnan Emberton A. Levada Dy, PharmD, [place] Please utilize [facility] for appropriate phone number to reach the unit pharmacist ([place])   05/03/2018,2:49 PM

## 2018-05-03 NOTE — Transfer of Care (Signed)
Immediate Anesthesia Transfer of Care Note  Patient: Shannon [name]  Procedure(s) Performed: AMPUTATION RIGHT GREAT TOE (Right Foot)  Patient Location: PACU  Anesthesia Type:MAC  Level of Consciousness: awake, alert  and oriented  Airway & Oxygen Therapy: Patient Spontanous Breathing and Patient connected to nasal cannula oxygen  Post-op Assessment: Report given to RN, Post -op Vital signs reviewed and stable and Patient moving all extremities X 4  Post vital signs: Reviewed and stable  Last Vitals:  Vitals Value Taken Time  BP    Temp    Pulse 75 05/03/2018  1:44 PM  Resp    SpO2 100 % 05/03/2018  1:44 PM  Vitals shown include unvalidated device data.  Last Pain:  Vitals:   05/03/18 1225  TempSrc:   PainSc: 0-No pain      Patients Stated Pain Goal: 4 (58/52/77 8242)  Complications: No apparent anesthesia complications

## 2018-05-03 NOTE — Progress Notes (Signed)
Orthopedic Tech Progress Note Patient Details:  Shannon [name] 10-12-1923 [id number]  Ortho Devices Type of Ortho Device: Dar[place] shoe Ortho Device/Splint Location: rle Ortho Device/Splint Interventions: Ordered, Application, Adjustment   Post Interventions Patient Tolerated: Well Instructions Provided: Care of device, Adjustment of device   Karolee Stamps 05/03/2018, 6:03 PM

## 2018-05-03 NOTE — Anesthesia Postprocedure Evaluation (Signed)
Anesthesia Post Note  Patient: Shannon [name]  Procedure(s) Performed: AMPUTATION RIGHT GREAT TOE (Right Foot)     Patient location during evaluation: PACU Anesthesia Type: Regional Level of consciousness: awake and alert Pain management: pain level controlled Vital Signs Assessment: post-procedure vital signs reviewed and stable Respiratory status: spontaneous breathing, nonlabored ventilation, respiratory function stable and patient connected to nasal cannula oxygen Cardiovascular status: stable and blood pressure returned to baseline Postop Assessment: no apparent nausea or vomiting Anesthetic complications: no    Last Vitals:  Vitals:   05/03/18 1415 05/03/18 1429  BP: 122/90 (!) 157/64  Pulse: 72 74  Resp: 17 13  Temp:    SpO2: 100% 100%    Last Pain:  Vitals:   05/03/18 1415  TempSrc:   PainSc: 0-No pain                 Arkin Imran P Amri Lien

## 2018-05-03 NOTE — Anesthesia Procedure Notes (Addendum)
Anesthesia Regional Block: Popliteal block   Pre-Anesthetic Checklist: ,, timeout performed, Correct Patient, Correct Site, Correct Laterality, Correct Procedure,, site marked, risks and benefits discussed, Surgical consent,  Pre-op evaluation,  At surgeon's request and post-op pain management  Laterality: Right  Prep: chloraprep       Needles:  Injection technique: Single-shot  Needle Type: Echogenic Stimulator Needle     Needle Length: 9cm  Needle Gauge: 21     Additional Needles:   Procedures:, nerve stimulator,,, ultrasound used (permanent image in chart),,,,   Nerve Stimulator or Paresthesia:  Response: dorsiflexion , 0.8 mA,   Additional Responses:   Narrative:  Start time: 05/03/2018 12:10 PM End time: 05/03/2018 12:20 PM Injection made incrementally with aspirations every 5 mL.  Performed by: Personally  Anesthesiologist: Murvin Natal, MD  Additional Notes: Functioning IV was confirmed and monitors were applied.  A 84mm 21ga Arrow echogenic stimulator needle was used. Sterile prep, hand hygiene and sterile gloves were used.  Negative aspiration and negative test dose prior to incremental administration of local anesthetic. The patient tolerated the procedure well.

## 2018-05-03 NOTE — Consult Note (Signed)
[facility]                                                                                    Patient Demographics  Shannon [name], is a 85 y.o. female  CSN: [id number]  MRN: [id number]  DOB - 10-07-1923  Admit Date - 05/03/2018  Outpatient Primary MD for the patient is Shannon Seller, NP   With History of -  Past Medical History:  Diagnosis Date  . Acute respiratory failure (HCC)   . Anemia    previous blood transfusions  . Arthritis    "all over"  . Asthma   . Bradycardia    requiring previous d/c of BB and reduction of amiodarone  . CAD (coronary artery disease)    nonobstructive per notes  . Chronic diastolic CHF (congestive heart failure) (HCC)   . CKD (chronic kidney disease), stage III (HCC)   . Complication of blood transfusion    "got the wrong blood type at [facility] in ~ 2015; no adverse reaction that we are aware of"/daughter, [name] (01/27/2016)  . COPD (chronic obstructive pulmonary disease) (HCC)   . Depression    "light case"  . DVT (deep venous thrombosis) (HCC) 01/2016   a. LLE DVT 01/2016 - switched from Eliquis to Coumadin.  [name] Dyspnea    with some activity  . Gastric stenosis    a. s/p stomach tube  . GERD (gastroesophageal reflux disease)   . History of blood transfusion    "several" (01/27/2016)  . History of stomach ulcers   . Hyperlipidemia   . Hypertension   . Hypothyroidism   . On home oxygen therapy    "2L; 9PM til 9AM" (06/27/2017)  . Paraesophageal hernia   . Perforated gastric ulcer (HCC)   . Peripheral vascular disease (HCC)   . Pneumonia    "a few times" (06/27/2017)  . Seasonal allergies   . SIADH (syndrome of inappropriate ADH production) (HCC)    Shannon [name] 01/10/2015  . Small bowel obstruction (HCC)    "I don't know how many" (01/11/2015)  . Stroke [facility])    several- no residual  . Type II diabetes mellitus (HCC)    "related to prednisone use  for > 20 yr; once predinose stopped; no more DM RX" (01/27/2016)   . UTI (urinary tract infection) 02/08/2016  . Ventral hernia with bowel obstruction       Past Surgical History:  Procedure Laterality Date  . ABDOMINAL AORTOGRAM N/A 09/21/2016   Procedure: Abdominal Aortogram;  Surgeon: Maeola Harman, MD;  Location: [facility] INVASIVE CV LAB;  Service: Cardiovascular;  Laterality: N/A;  . AMPUTATION Left 09/25/2016   Procedure: AMPUTATION DIGIT- LEFT 2ND AND 3RD TOES;  Surgeon: Larina Earthly, MD;  Location: [facility] OR;  Service: Vascular;  Laterality: Left;  . CATARACT EXTRACTION W/ INTRAOCULAR LENS  IMPLANT, BILATERAL    . CHOLECYSTECTOMY OPEN    . COLECTOMY    . ESOPHAGOGASTRODUODENOSCOPY N/A 01/19/2014   Procedure: ESOPHAGOGASTRODUODENOSCOPY (EGD);  Surgeon: Hilarie Fredrickson, MD;  Location: [facility] ENDOSCOPY;  Service: Endoscopy;  Laterality: N/A;  . ESOPHAGOGASTRODUODENOSCOPY N/A 01/20/2014   Procedure: ESOPHAGOGASTRODUODENOSCOPY (EGD);  Surgeon: Wilhemina Bonito  Marina Goodell, MD;  Location: [facility] ENDOSCOPY;  Service: Endoscopy;  Laterality: N/A;  . ESOPHAGOGASTRODUODENOSCOPY N/A 03/19/2014   Procedure: ESOPHAGOGASTRODUODENOSCOPY (EGD);  Surgeon: Rachael Fee, MD;  Location: [facility] ENDOSCOPY;  Service: Endoscopy;  Laterality: N/A;  . ESOPHAGOGASTRODUODENOSCOPY N/A 07/08/2015   Procedure: ESOPHAGOGASTRODUODENOSCOPY (EGD);  Surgeon: Sherrilyn Rist, MD;  Location: [facility] ENDOSCOPY;  Service: Endoscopy;  Laterality: N/A;  . ESOPHAGOGASTRODUODENOSCOPY (EGD) WITH PROPOFOL N/A 09/15/2015   Procedure: ESOPHAGOGASTRODUODENOSCOPY (EGD) WITH PROPOFOL;  Surgeon: Ruffin Frederick, MD;  Location: [facility] ENDOSCOPY;  Service: Gastroenterology;  Laterality: N/A;  . GASTROJEJUNOSTOMY     hx/notes 01/10/2015  . GASTROJEJUNOSTOMY N/A 09/23/2015   Procedure: OPEN GASTROJEJUNOSTOMY TUBE PLACEMENT;  Surgeon: De Blanch Kinsinger, MD;  Location: [facility] ORS;  Service: General;  Laterality: N/A;  . GLAUCOMA SURGERY Bilateral   . HERNIA REPAIR  2015  . IR CM INJ ANY COLONIC TUBE W/FLUORO  01/05/2017  . IR CM INJ  ANY COLONIC TUBE W/FLUORO  06/07/2017  . IR CM INJ ANY COLONIC TUBE W/FLUORO  11/05/2017  . IR GENERIC HISTORICAL  01/07/2016   IR GJ TUBE CHANGE 01/07/2016 Malachy Moan, MD [facility]-INTERV RAD  . IR GENERIC HISTORICAL  01/27/2016   IR MECH REMOV OBSTRUC MAT ANY COLON TUBE W/FLUORO 01/27/2016 Richarda Overlie, MD [facility]  . IR GENERIC HISTORICAL  02/07/2016   IR PATIENT EVAL TECH 0-60 MINS Darrell K Allred, PA-C [facility]INTER[facility]  . IR GENERIC HISTORICAL  02/08/2016   IR GJ TUBE CHANGE 02/08/2016 Berdine Dance, MD [facility]  . IR GENERIC HISTORICAL  01/06/2016   IR GJ TUBE CHANGE 01/06/2016 [facility]-RAD OUT REF  . IR GENERIC HISTORICAL  05/02/2016   IR CM INJ ANY COLONIC TUBE W/FLUORO 05/02/2016 Oley Balm, MD [facility]  . IR GENERIC HISTORICAL  05/15/2016   IR GJ TUBE CHANGE 05/15/2016 Simonne Come, MD [facility]  . IR GENERIC HISTORICAL  06/28/2016   IR GJ TUBE CHANGE 06/28/2016 [facility]  . IR GJ TUBE CHANGE  02/20/2017  . IR GJ TUBE CHANGE  05/10/2017  . IR GJ TUBE CHANGE  07/06/2017  . IR GJ TUBE CHANGE  08/02/2017  . IR GJ TUBE CHANGE  10/15/2017  . IR GJ TUBE CHANGE  12/26/2017  . IR GJ TUBE CHANGE  04/08/2018  . IR PATIENT EVAL TECH 0-60 MINS  10/19/2016  . IR PATIENT EVAL TECH 0-60 MINS  12/25/2016  . IR PATIENT EVAL TECH 0-60 MINS  01/29/2017  . IR PATIENT EVAL TECH 0-60 MINS  04/04/2017  . IR PATIENT EVAL TECH 0-60 MINS  04/30/2017  . IR PATIENT EVAL TECH 0-60 MINS  07/31/2017  . IR REPLC DUODEN/JEJUNO TUBE PERCUT W/FLUORO  11/14/2016  . LAPAROTOMY N/A 01/20/2015   Procedure: EXPLORATORY LAPAROTOMY;  Surgeon: Abigail Miyamoto, MD;  Location: [facility] OR;  Service: General;  Laterality: N/A;  . LOWER EXTREMITY ANGIOGRAPHY Left 09/21/2016   Procedure: Lower Extremity Angiography;  Surgeon: Maeola Harman, MD;  Location: [facility] INVASIVE CV LAB;  Service: Cardiovascular;  Laterality: Left;  . LOWER EXTREMITY ANGIOGRAPHY Right 06/27/2017   Procedure: Lower Extremity Angiography;  Surgeon: Maeola Harman, MD;  Location: [facility] INVASIVE CV LAB;  Service: Cardiovascular;  Laterality: Right;  . LYSIS OF ADHESION N/A 01/20/2015   Procedure: LYSIS OF ADHESIONS < 1 HOUR;  Surgeon: Abigail Miyamoto, MD;  Location: [facility] OR;  Service: General;  Laterality: N/A;  . PERIPHERAL VASCULAR BALLOON ANGIOPLASTY Left 09/21/2016   Procedure: Peripheral Vascular Balloon Angioplasty;  Surgeon: Lemar Livings  Cristal Deer, MD;  Location: [facility] INVASIVE CV LAB;  Service: Cardiovascular;  Laterality: Left;  drug coated balloon  . PERIPHERAL VASCULAR BALLOON ANGIOPLASTY Right 06/27/2017   Procedure: PERIPHERAL VASCULAR BALLOON ANGIOPLASTY;  Surgeon: Maeola Harman, MD;  Location: [facility] INVASIVE CV LAB;  Service: Cardiovascular;  Laterality: Right;  SFA/TPTRUNK  . TONSILLECTOMY    . TUBAL LIGATION    . VENTRAL HERNIA REPAIR  2015   incarcerated ventral hernia ([facility] 09/2013)/notes 01/10/2015    in for   No chief complaint on file.    HPI  Shannon [name]  is a 85 y.o. female, with past medical history significant for CAD, A. fib on warfarin, history of CVA, hypothyroidism, hypertension, diabetes mellitus, chronic diastolic congestive heart failure and PCM/dysphagia status post PEG tube placement, chronic, who was admitted originally today for right big toe amputation .  Patient tolerated surgery well and is being admitted today due to complex medical issues mentioned above.  Patient denies any chest pains, shortness of breath, nausea vomiting or diarrhea.  Patient seen postop.  Her main complaint is right foot pain.    Review of Systems    In addition to the HPI above,  No Fever-chills, No Headache, No changes with Vision or hearing, No problems swallowing food or Liquids, No Chest pain, Cough or Shortness of Breath, No Abdominal pain, No Nausea or Vommitting, Bowel movements are regular, No Blood in stool or Urine, No dysuria, No new skin rashes or bruises, No new weakness, tingling, numbness in any  extremity, No recent weight gain or loss, No polyuria, polydypsia or polyphagia, No significant Mental Stressors.  A full 10 point Review of Systems was done, except as stated above, all other Review of Systems were negative.   Social History Social History   Tobacco Use  . Smoking status: Never Smoker  . Smokeless tobacco: Former Neurosurgeon    Types: Snuff  . Tobacco comment: "used snuff in my younger days"  Substance Use Topics  . Alcohol use: Never    Alcohol/week: 0.0 standard drinks    Frequency: Never     Family History Family History  Problem Relation Age of Onset  . Stroke Mother   . Hypertension Mother   . Diabetes Brother   . Glaucoma Son   . Glaucoma Son   . Heart attack Neg Hx   . Colon cancer Neg Hx   . Stomach cancer Neg Hx      Prior to Admission medications   Medication Sig Start Date End Date Taking? Authorizing Provider  acetaminophen (TYLENOL) 500 MG tablet Take 500 mg by mouth every 6 (six) hours as needed (pain).    Yes [provider]  Amino Acids-Protein Hydrolys (FEEDING SUPPLEMENT, PRO-STAT SUGAR FREE 64,) LIQD Take 30 mLs by mouth 2 (two) times daily.   Yes [provider]  atorvastatin (LIPITOR) 10 MG tablet TAKE 1 TABLET BY MOUTH EVERY DAY 01/09/18  Yes Kirt Boys, DO  b complex vitamins tablet Take 1 tablet by mouth daily.    Yes [provider]  calcium-vitamin D (OSCAL WITH D) 500-200 MG-UNIT per tablet Take 1 tablet by mouth daily with breakfast.    Yes [provider]  Carboxymethylcellulose Sodium (ARTIFICIAL TEARS OP) Place 1 drop into both eyes 4 (four) times daily.   Yes [provider]  carvedilol (COREG) 6.25 MG tablet Take 1 tablet (6.25 mg total) by mouth 2 (two) times daily. 01/07/18  Yes Montez Morita, Monica, DO  collagenase (SANTYL) ointment Apply 1  application topically daily. Patient taking differently: Apply 1 application topically daily as needed (wound care).  05/21/17  Yes Felecia Shelling, DPM  cycloSPORINE (RESTASIS) 0.05 % ophthalmic emulsion USE 1 DROP INTO BOTH EYES TWICE DAILY 01/29/17  Yes Kirt Boys, DO  feeding supplement (OSMOLITE 1 CAL) LIQD Take 711 mLs by mouth See admin instructions. For 12 hours 2100 - 0900   Yes [provider]  ferrous sulfate 325 (65 FE) MG tablet TAKE ONE TABLET BY MOUTH ONCE DAILY WITH BREAKFAST Patient taking differently: Take 325 mg by mouth daily.  11/14/17  Yes Montez Morita, Monica, DO  fluticasone (FLONASE) 50 MCG/ACT nasal spray Spray 2 sprays into each nostril once daily Patient taking differently: Place 2 sprays into both nostrils 2 (two) times daily.  02/27/18  Yes Shannon Seller, NP  furosemide (LASIX) 40 MG tablet TAKE 1 TABLET BY MOUTH EVERY DAY 04/12/18  Yes Lars Masson, MD  gabapentin (NEURONTIN) 300 MG capsule TAKE 2 CAPSULES (600 MG TOTAL) BY MOUTH 3 (THREE) TIMES DAILY. 10/15/17  Yes Montez Morita, Monica, DO  gentamicin cream (GARAMYCIN) 0.1 % Apply 1 application topically 3 (three) times daily. 08/29/17  Yes Felecia Shelling, DPM  hydrALAZINE (APRESOLINE) 50 MG tablet Take 50 mg by mouth 3 (three) times daily.   Yes [provider]  insulin aspart (NOVOLOG FLEXPEN) 100 UNIT/ML FlexPen Take three times daily before each meal based on sliding scale 140-199( 2 units), 200-250 (4 units), 251-299 (6 units), 300-349 (8 units), > 350(10 units) 02/26/17  Yes Kirt Boys, DO  Insulin Glargine (LANTUS SOLOSTAR) 100 UNIT/ML Solostar Pen INJECT 10 UNITS DAILY TO CONTROL BLOOD SUGAR Patient taking differently: Inject 10 Units into the skin at bedtime.  01/17/18  Yes Montez Morita, Monica, DO  Insulin Pen Needle (B-D UF III MINI PEN NEEDLES) 31G X 5 MM MISC Use twice daily with giving insulin injections. Dx E11.9 04/25/17  Yes Montez Morita, Monica, DO  isosorbide mononitrate (IMDUR) 30 MG 24 hr tablet TAKE 2 TABLETS (60 MG TOTAL) BY MOUTH DAILY. 04/11/18  Yes Lars Masson, MD  latanoprost (XALATAN) 0.005 % ophthalmic solution Place 1  drop into both eyes at bedtime. 06/02/14  Yes Oneal Grout, MD  levothyroxine (SYNTHROID, LEVOTHROID) 125 MCG tablet Take 1 tablet (125 mcg total) by mouth daily. 04/18/18  Yes Shannon Seller, NP  mometasone-formoterol [facility]) 200-5 MCG/ACT AERO Take two puffs by mouth twice daily 04/10/18  Yes Eubanks, Janene Harvey, NP  Multiple Vitamin (MULTIVITAMIN WITH MINERALS) TABS tablet Take 1 tablet by mouth daily.   Yes [provider]  nystatin (MYCOSTATIN/NYSTOP) powder Apply topically 3 (three) times daily. Patient taking differently: Apply 1 g topically 3 (three) times daily as needed (raw skin).  09/26/16  Yes Rhetta Mura, MD  oxyCODONE (ROXICODONE) 5 MG immediate release tablet Take 1 tablet (5 mg total) by mouth every 4 (four) hours as needed for severe pain. May take 1/2 tab q4hrs as needed for mild pain. Patient taking differently: Take 2.5 mg by mouth 2 (two) times daily as needed for severe pain.  01/30/17  Yes Kirt Boys, DO  OXYGEN Inhale 2 L into the lungs at bedtime.   Yes [provider]  pantoprazole (PROTONIX) 40 MG tablet TAKE 1 TABLET BY MOUTH TWICE A DAY 01/01/18  Yes Montez Morita, Monica, DO  sertraline (ZOLOFT) 100 MG tablet TAKE 1 TABLET BY MOUTH EVERY DAY FOR DEPRESSION Patient taking differently: Take 100 mg by mouth daily.  04/17/18  Yes Shannon Seller,  NP  sulfamethoxazole-trimethoprim (BACTRIM DS,SEPTRA DS) 800-160 MG tablet Take 1 tablet by mouth 2 (two) times daily. 05/01/18  Yes Felecia Shelling, DPM  warfarin (COUMADIN) 2.5 MG tablet TAKE AS DIRECTED BY COUMADIN CLINIC. Patient taking differently: Take 2.5 mg by mouth See admin instructions. Take 5 mg every evening except on Friday take 2.5 mg in the evening 03/04/18  Yes Wendall Stade, MD  Water For Irrigation, Sterile (FREE WATER) SOLN Place 120 mLs into feeding tube 3 (three) times daily.    Yes [provider]  White Petrolatum-Mineral Oil (SYSTANE NIGHTTIME) OINT Place 1 application into  both eyes at bedtime.    Yes [provider]  doxycycline (VIBRA-TABS) 100 MG tablet Take 1 tablet (100 mg total) by mouth 2 (two) times daily. Patient not taking: Reported on 05/02/2018 03/13/18   Felecia Shelling, DPM  ipratropium-albuterol (DUONEB) 0.5-2.5 (3) MG/3ML SOLN Take 3 mLs by nebulization every 6 (six) hours as needed (sob). Use 3 times daily x 4 days then every 6 hours as needed. Patient taking differently: Take 3 mLs by nebulization every 6 (six) hours as needed (sob).  03/21/16   Janetta Hora, PA-C  levalbuterol [facility] HFA) 45 MCG/ACT inhaler Inhale 2 puffs into the lungs every 6 (six) hours as needed for wheezing or shortness of breath. 02/03/16   Kirt Boys, DO  loratadine (CLARITIN) 10 MG tablet Take 10 mg by mouth daily as needed for allergies.    [provider]    Allergies  Allergen Reactions  . Penicillins Itching, Rash and Other (See Comments)    Tolerated amoxicillin, unasyn, zosyn & cephalosporins in the past. Did it involve swelling of the face/tongue/throat, SOB, or low BP? No Did it involve sudden or severe rash/hives, skin peeling, or any reaction on the inside of your mouth or nose? No Did you need to seek medical attention at a hospital or doctor's office? Unknown When did it last happen?5+ years If all above answers are "NO", may proceed with cephalosporin use.     Physical Exam  Vitals  Blood pressure (!) 157/64, pulse 74, temperature (!) 97.2 F (36.2 C), resp. rate 13, height 5\' 7"  (1.702 m), weight 65.8 kg, SpO2 100 %.   1. General elderly female, chronically ill, no acute distress  2. Normal affect and insight, Not Suicidal or Homicidal, pleasantly confused.  3. No F.N deficits, grossly, patient moving all extremities.  4. Ears and Eyes appear Normal, Conjunctivae clear, PERRLA. Moist Oral Mucosa.  5. Supple Neck, No JVD, No cervical lymphadenopathy appriciated, No Carotid Bruits.  6. Symmetrical Chest wall  movement, Good air movement bilaterally, CTAB.  7. RRR, No Gallops, Rubs or Murmurs, No Parasternal Heave.  8. Positive Bowel Sounds, Abdomen Soft, Non tender, PEG tube in place.  9.  No Cyanosis, Normal Skin Turgor, No Skin Rash or Bruise.  10. Good muscle tone,  joints appear normal , no effusions, Normal ROM.    Data Review  CBC Recent Labs  Lab 05/03/18 1055  WBC 8.5  HGB 8.8*  HCT 27.6*  PLT 230  MCV 93.2  MCH 29.7  MCHC 31.9  RDW 15.6*   ------------------------------------------------------------------------------------------------------------------  Chemistries  Recent Labs  Lab 05/03/18 1055  NA 142  K 4.5  CL 104  CO2 25  GLUCOSE 114*  BUN 84*  CREATININE 2.18*  CALCIUM 9.6  AST 42*  ALT 34  ALKPHOS 59  BILITOT 1.0   ------------------------------------------------------------------------------------------------------------------ estimated creatinine clearance is 16 mL/min (A) (  by C-G formula based on SCr of 2.18 mg/dL (H)). ------------------------------------------------------------------------------------------------------------------ No results for input(s): TSH, T4TOTAL, T3FREE, THYROIDAB in the last 72 hours.  Invalid input(s): FREET3   Coagulation profile Recent Labs  Lab 05/03/18 1055  INR 2.08   ------------------------------------------------------------------------------------------------------------------- No results for input(s): DDIMER in the last 72 hours. -------------------------------------------------------------------------------------------------------------------  Cardiac Enzymes No results for input(s): CKMB, TROPONINI, MYOGLOBIN in the last 168 hours.  Invalid input(s): CK ------------------------------------------------------------------------------------------------------------------ Invalid input(s):  POCBNP   ---------------------------------------------------------------------------------------------------------------  Urinalysis    Component Value Date/Time   COLORURINE YELLOW 11/06/2017 1448   APPEARANCEUR CLEAR 11/06/2017 1448   LABSPEC 1.013 11/06/2017 1448   PHURINE 7.5 11/06/2017 1448   GLUCOSEU NEGATIVE 11/06/2017 1448   HGBUR NEGATIVE 11/06/2017 1448   BILIRUBINUR Neg 11/01/2017 1228   KETONESUR NEGATIVE 11/06/2017 1448   PROTEINUR NEGATIVE 11/06/2017 1448   UROBILINOGEN 0.2 11/01/2017 1228   UROBILINOGEN 0.2 01/27/2015 2308   NITRITE NEGATIVE 11/06/2017 1448   LEUKOCYTESUR 2+ (A) 11/06/2017 1448    ----------------------------------------------------------------------------------------------------------------   Imaging results:   Ir Gj Tube Change  Result Date: 04/08/2018 INDICATION: 85 year old female with a percutaneous gastrojejunostomy tube. The external portion has a hole in it. Patient presents for exchange for a new device. EXAM: JEJUNAL CATHETER REPLACEMENT MEDICATIONS: None ANESTHESIA/SEDATION: None CONTRAST:  15 mL Isovue-300-administered into the jejunal lumen. FLUOROSCOPY TIME:  Fluoroscopy Time: 4 minutes 18 seconds (30 mGy). COMPLICATIONS: None immediate. PROCEDURE: Informed written consent was obtained from the patient after a thorough discussion of the procedural risks, benefits and alternatives. All questions were addressed. Maximal Sterile Barrier Technique was utilized including caps, mask, sterile gowns, sterile gloves, sterile drape, hand hygiene and skin antiseptic. A timeout was performed prior to the initiation of the procedure. A hand injection of contrast material was performed through the existing tube. The distal tube is clogged. The injected contrast material spills out the hole in the external portion of the tube. Therefore, a wire was advanced through the tube and into the proximal jejunum. This required multiple wires as well as a supporting  4 [place] glide catheter. Ultimately, a stiff glidewire was forced through the sidewall of the distal tube in the jejunum. The retention balloon was then deflated and the existing gastro jejunostomy tube was removed over the wire. A new 22 French GJ tube was then successfully navigated over the wire, through the stomach and into the proximal jejunum. The retention balloon was filled with 8 mL water and pulled snug against the anterior abdominal wall. The external bumper was fixed in place. IMPRESSION: Successful exchange for a new 22 French gastrojejunostomy tube. Electronically Signed   By: Malachy Moan M.D.   On: 04/08/2018 16:17   Dg Foot Complete Right  Result Date: 05/01/2018 Please see detailed radiograph report in office note.     Assessment & Plan  Peripheral vascular disease status post right first toe amputation Continue with pain control and local wound care  History of diabetes mellitus ISS/Lantus  COPD Continue with nebulizer treatment  A. Fib , rate controlled continue with Coreg Continue with Coumadin  CAD; stable Continue with beta-blocker and Imdur  PCM/dysphagia Continue with tube feeds per PEG  &.p.o. when cleared by surgery  Acute pain Continue with OxyIR and as needed morphine continue with Neurontin  Hypothyroidism Continue Synthroid  Diastolic congestive heart failure last echo in 05/2016 showing ejection fraction of 60 to 65% Hold Lasix due to renal insufficiency  Acute on chronic renal insufficiency Start hydration      DVT  Prophylaxis Coumadin  AM Labs Ordered, also please review Full Orders  Code Status full  Disposition Plan: To be determined  Time spent in minutes : 48 minutes  Condition GUARDED   @SIGNATURE @

## 2018-05-03 NOTE — Op Note (Signed)
OPERATIVE REPORT  DATE OF SURGERY: 05/03/2018  PATIENT: Shannon [name], 85 y.o. female MRN: [id number]  DOB: February 27, 1924  PRE-OPERATIVE DIAGNOSIS: Gangrene right great toe  POST-OPERATIVE DIAGNOSIS:  Same  PROCEDURE: Right great toe amputation  SURGEON:  Gretta Began, M.D.  PHYSICIAN ASSISTANT: Nurse  ANESTHESIA: Ankle block  EBL: per anesthesia record  Total I/O In: 1190 [I.V.:800; IV Piggyback:390] Out: 50 [Blood:50]  BLOOD ADMINISTERED: none  DRAINS: none  SPECIMEN: none  COUNTS CORRECT:  YES  PATIENT DISPOSITION:  PACU - hemodynamically stable  PROCEDURE DETAILS: Patient was taken up and placed to position the area of the right foot was prepped draped you sterile fashion.  Incision was made as a tennis racquet type fashion to include the metatarsal head and was carried down to the metatarsal bone.  The patient had extensive tissue loss up to the the area near the skin incision.  There was good bleeding at the amputation site.  There was no evidence of purulence.  The metatarsal bone was transected proximal to the skin edges.  The bone was removed further proximally with bone rongeur.  The specimen was passed off the field.  The wounds were irrigated with saline and hemostasis obtained electrocautery.  Several 3-0 Vicryl's were used to close the tissue over the bone.  The skin was closed with 3-0 nylon mattress sutures.  Sterile dressing was applied and the patient was transferred to the recovery room in stable condition   Larina Earthly, M.D., [facility] 05/03/2018 2:05 PM

## 2018-05-03 NOTE — Progress Notes (Signed)
Spoke with Dr Donnetta Hutching. Hospitalist to admit patient, waiting on orders from PACU.

## 2018-05-03 NOTE — Anesthesia Preprocedure Evaluation (Addendum)
Anesthesia Evaluation  Patient identified by MRN, date of birth, ID band Patient awake    Reviewed: Allergy & Precautions, NPO status , Patient's Chart, lab work & pertinent test results  Airway Mallampati: II  TM Distance: >3 FB Neck ROM: Full    Dental  (+) Edentulous Upper, Edentulous Lower Thrush:   Pulmonary shortness of breath, asthma , COPD (O2 at night),  oxygen dependent,    Pulmonary exam normal breath sounds clear to auscultation       Cardiovascular hypertension, Pt. on home beta blockers + CAD, + Peripheral Vascular Disease, +CHF and + DVT  Normal cardiovascular exam Rhythm:Regular Rate:Normal  ECG: SR, rate 51  Sees cardiologist Meda Coffee)  ECHO: LV EF: 60% -   65% (2018)    Neuro/Psych PSYCHIATRIC DISORDERS Depression CVA, Residual Symptoms    GI/Hepatic Neg liver ROS, hiatal hernia, PUD, GERD  Medicated and Controlled,  Endo/Other  diabetes, Insulin DependentHypothyroidism   Renal/GU Renal disease     Musculoskeletal negative musculoskeletal ROS (+)   Abdominal   Peds  Hematology HLD   Anesthesia Other Findings PERIPHERAL VASCULAR DISEASE WITH ULCER RIGHT FOOT  Reproductive/Obstetrics                            Anesthesia Physical Anesthesia Plan  ASA: IV  Anesthesia Plan: Regional   Post-op Pain Management:    Induction:   PONV Risk Score and Plan: 2 and Treatment may vary due to age or medical condition  Airway Management Planned: Natural Airway  Additional Equipment:   Intra-op Plan:   Post-operative Plan:   Informed Consent: I have reviewed the patients History and Physical, chart, labs and discussed the procedure including the risks, benefits and alternatives for the proposed anesthesia with the patient or authorized representative who has indicated his/her understanding and acceptance.       Plan Discussed with: CRNA  Anesthesia Plan Comments:         Anesthesia Quick Evaluation

## 2018-05-03 NOTE — H&P (Signed)
[facility]ist of [place]  Patient name: Shannon [name] MRN: [id number] DOB: 11-Apr-1923 Sex: female    HPI: Shannon [name] is a 85 y.o. female nonhealing right toe ulcers.  Had been followed by Dr. Logan Bores with podiatry.  The family has requested that we proceed with amputation.  She had had similar issues in her left foot with eventual healing after revascularization she has multiple medical problems.  Past Medical History:  Diagnosis Date  . Acute respiratory failure (HCC)   . Anemia    previous blood transfusions  . Arthritis    "all over"  . Asthma   . Bradycardia    requiring previous d/c of BB and reduction of amiodarone  . CAD (coronary artery disease)    nonobstructive per notes  . Chronic diastolic CHF (congestive heart failure) (HCC)   . CKD (chronic kidney disease), stage III (HCC)   . Complication of blood transfusion    "got the wrong blood type at [facility] in ~ 2015; no adverse reaction that we are aware of"/daughter, Shannon [name] (01/27/2016)  . COPD (chronic obstructive pulmonary disease) (HCC)   . Depression    "light case"  . DVT (deep venous thrombosis) (HCC) 01/2016   a. LLE DVT 01/2016 - switched from Eliquis to Coumadin.  [name] Dyspnea    with some activity  . Gastric stenosis    a. s/p stomach tube  . GERD (gastroesophageal reflux disease)   . History of blood transfusion    "several" (01/27/2016)  . History of stomach ulcers   . Hyperlipidemia   . Hypertension   . Hypothyroidism   . On home oxygen therapy    "2L; 9PM til 9AM" (06/27/2017)  . Paraesophageal hernia   . Perforated gastric ulcer (HCC)   . Peripheral vascular disease (HCC)   . Pneumonia    "a few times" (06/27/2017)  . Seasonal allergies   . SIADH (syndrome of inappropriate ADH production) (HCC)    [name] 01/10/2015  . Small bowel obstruction (HCC)    "I don't know how many" (01/11/2015)  . Stroke [facility])    several- no residual  . Type  II diabetes mellitus (HCC)    "related to prednisone use  for > 20 yr; once predinose stopped; no more DM RX" (01/27/2016)  . UTI (urinary tract infection) 02/08/2016  . Ventral hernia with bowel obstruction     Family History  Problem Relation Age of Onset  . Stroke Mother   . Hypertension Mother   . Diabetes Brother   . Glaucoma Son   . Glaucoma Son   . Heart attack Neg Hx   . Colon cancer Neg Hx   . Stomach cancer Neg Hx     SOCIAL HISTORY: Social History   Tobacco Use  . Smoking status: Never Smoker  . Smokeless tobacco: Former Neurosurgeon    Types: Snuff  . Tobacco comment: "used snuff in my younger days"  Substance Use Topics  . Alcohol use: Never    Alcohol/week: 0.0 standard drinks    Frequency: Never    Allergies  Allergen Reactions  . Penicillins Itching, Rash and Other (See Comments)    Tolerated amoxicillin, unasyn, zosyn & cephalosporins in the past. Did it involve swelling of the face/tongue/throat, SOB, or  low BP? No Did it involve sudden or severe rash/hives, skin peeling, or any reaction on the inside of your mouth or nose? No Did you need to seek medical attention at a hospital or doctor's office? Unknown When did it last happen?5+ years If all above answers are "NO", may proceed with cephalosporin use.     Current Facility-Administered Medications  Medication Dose Route Frequency Provider Last Rate Last Dose  . 0.9 %  sodium chloride infusion   Intravenous Continuous Early, Kristen Loader, MD      . 0.9 % irrigation (POUR BTL)    PRN Larina Earthly, MD   1,000 mL at 05/03/18 1212  . chlorhexidine (HIBICLENS) 4 % liquid 4 application  60 mL Topical Once Early, Kristen Loader, MD       And  . Melene Muller ON 05/04/2018] chlorhexidine (HIBICLENS) 4 % liquid 4 application  60 mL Topical Once Early, Kristen Loader, MD      . fentaNYL (SUBLIMAZE) 100 MCG/2ML injection           . lactated ringers infusion   Intravenous Continuous Ellender, Catheryn Bacon, MD 10 mL/hr at 05/03/18 1128    .  midazolam (VERSED) 2 MG/2ML injection           . vancomycin (VANCOCIN) IVPB 1000 mg/200 mL premix  1,000 mg Intravenous 60 min Pre-Op Early, Kristen Loader, MD 200 mL/hr at 05/03/18 1128 1,000 mg at 05/03/18 1128    REVIEW OF SYSTEMS:  [X]  denotes positive finding, [ ]  denotes negative finding Cardiac  Comments:  Chest pain or chest pressure:    Shortness of breath upon exertion:    Short of breath when lying flat:    Irregular heart rhythm: x       Vascular    Pain in calf, thigh, or hip brought on by ambulation:    Pain in feet at night that wakes you up from your sleep:     Blood clot in your veins:    Leg swelling:  x         PHYSICAL EXAM: Vitals:   05/03/18 1104  BP: (!) 141/39  Pulse: 66  Resp: 18  Temp: 98.3 F (36.8 C)  TempSrc: Oral  SpO2: 100%  Weight: 65.8 kg  Height: 5\' 7"  (1.702 m)    GENERAL: The patient is a well-nourished female, in no acute distress. The vital signs are documented above. CARDIOVASCULAR: No palpable pedal pulses.  Palpable femoral pulse PULMONARY: There is good air exchange  MUSCULOSKELETAL: There are no major deformities or cyanosis. NEUROLOGIC: No focal weakness or paresthesias are detected. SKIN: There are no ulcers or rashes noted. PSYCHIATRIC: The patient has a normal affect.    MEDICAL ISSUES: Gangrenous changes toes of right foot.  For amputation.  Discussed with the patient and daughter present    Larina Earthly, MD [facility] Specialists of [facility] Tel [phone] Pager [phone]

## 2018-05-04 LAB — PROTIME-INR
INR: 2.13
Prothrombin Time: 23.5 seconds — ABNORMAL HIGH (ref 11.4–15.2)

## 2018-05-04 LAB — GLUCOSE, CAPILLARY
Glucose-Capillary: 169 mg/dL — ABNORMAL HIGH (ref 70–99)
Glucose-Capillary: 85 mg/dL (ref 70–99)
Glucose-Capillary: 102 mg/dL — ABNORMAL HIGH (ref 70–99)
Glucose-Capillary: 125 mg/dL — ABNORMAL HIGH (ref 70–99)

## 2018-05-04 LAB — WOUND CULTURE
GRAM STAIN:: NONE SEEN
MICRO NUMBER:: 122498
SPECIMEN QUALITY:: ADEQUATE

## 2018-05-04 MED ORDER — PRO-STAT SUGAR FREE PO LIQD
30.0000 mL | Freq: Two times a day (BID) | ORAL | Status: DC
  Administered 2018-05-04 – 2018-05-09 (×10): 30 mL via ORAL
  Filled 2018-05-04 (×10): qty 30

## 2018-05-04 MED ORDER — ENSURE MAX PROTEIN PO LIQD
11.0000 [oz_av] | Freq: Every day | ORAL | Status: DC
  Administered 2018-05-06 – 2018-05-08 (×3): 11 [oz_av] via ORAL
  Filled 2018-05-04 (×4): qty 330

## 2018-05-04 MED ORDER — OSMOLITE 1.2 CAL PO LIQD
1000.0000 mL | ORAL | Status: AC
  Administered 2018-05-04: 1000 mL
  Filled 2018-05-04: qty 1000

## 2018-05-04 MED ORDER — WARFARIN SODIUM 5 MG PO TABS
5.0000 mg | ORAL_TABLET | Freq: Once | ORAL | Status: AC
  Administered 2018-05-04: 5 mg via ORAL
  Filled 2018-05-04: qty 1

## 2018-05-04 MED ORDER — OSMOLITE 1.2 CAL PO LIQD
1000.0000 mL | ORAL | Status: DC
  Filled 2018-05-04: qty 1000

## 2018-05-04 NOTE — Progress Notes (Signed)
[place] for warfarin Indication: atrial fibrillation  Allergies  Allergen Reactions  . Penicillins Itching, Rash and Other (See Comments)    Tolerated amoxicillin, unasyn, zosyn & cephalosporins in the past. Did it involve swelling of the face/tongue/throat, SOB, or low BP? No Did it involve sudden or severe rash/hives, skin peeling, or any reaction on the inside of your mouth or nose? No Did you need to seek medical attention at a hospital or doctor's office? Unknown When did it last happen?5+ years If all above answers are "NO", may proceed with cephalosporin use.     Patient Measurements: Height: 5\' 7"  (170.2 cm) Weight: 145 lb (65.8 kg) IBW/kg (Calculated) : 61.6   Vital Signs: Temp: 98.8 F (37.1 C) (02/01 0432) Temp Source: Oral (02/01 0432) BP: 130/56 (02/01 0451) Pulse Rate: 66 (02/01 0451)  Labs: Recent Labs    05/03/18 1055 05/04/18 0333  HGB 8.8*  --   HCT 27.6*  --   PLT 230  --   LABPROT 23.2* 23.5*  INR 2.08 2.13  CREATININE 2.18*  --     Estimated Creatinine Clearance: 16 mL/min (A) (by C-G formula based on SCr of 2.18 mg/dL (H)).  Assessment: 85 yo female admitted s/p right great toe amputation. Patient on warfarin PTA for atrial fibrillation. Pharmacy consulted to manage warfarin.   INR therapeutic at 2.13. No bleeding noted, no labs today.  PTA: 5mg  daily except 2.5mg  on Fridays  Goal of Therapy:  INR 2-3 Monitor platelets by anticoagulation protocol: Yes   Plan:  Warfarin 5 mg PO tonight Daily INR Monitor for s/sx bleeding   Renold Genta, PharmD, BCPS Clinical Pharmacist Clinical phone for 05/04/2018 until 3p is [phone] 05/04/2018 12:22 PM  **Pharmacist phone directory can now be found on [email or web address] listed under [place]**

## 2018-05-04 NOTE — Progress Notes (Signed)
PROGRESS NOTE  Shannon [name] WJX:[id number] DOB: 18-Jul-1923 DOA: 05/03/2018 PCP: Sharon Seller, NP  HPI/Recap of past 24 hours: Shannon [name]  is a 85 y.o. female, with past medical history significant for CAD, A. fib on warfarin, history of CVA, hypothyroidism, hypertension, diabetes mellitus, chronic diastolic congestive heart failure and PCM/dysphagia status post PEG tube placement, chronic, who was admitted originally today for right big toe amputation .  Patient tolerated surgery well and is being admitted today due to complex medical issues mentioned above.  Patient denies any chest pains, shortness of breath, nausea vomiting or diarrhea.  Patient seen postop.  Her main complaint is right foot pain.  05/04/2018: POD #1 post right big toe amputation.  Patient seen and examined at bedside.  No acute events overnight.  She is alert and oriented x3.  Reports minimal pain on her right lower extremity.  She requests a clear liquid diet.  PEG tube in place.  No new complaints.  Assessment/Plan: Active Problems:   Peripheral vascular disease (HCC)  Gangrene right great toe status post right big toe amputation by vascular surgery Right great toe amputation on 05/03/2018 Continue pain management Possible removal dressing tomorrow vascular surgery Continue to closely monitor PT OT to assess  AKI on CKD 3 Baseline creatinine appears to be 1.49 on 04/17/18 with GFR of 34 Presented with creatinine of 2.18 Start gentle IV fluid hydration Monitor urine output Monitor electrolytes Repeat BMP in the morning  Acute blood loss anemia status post surgery Baseline hemoglobin appears to be 10 Hemoglobin drop today 8.8 with normal MCV No sign of overt bleeding at this time Monitor H&H Repeat CBC in the morning  Dysphagia status post PEG tube placement Resume PEG tube feeding Dietitian consult  Hypothyroidism Continue Synthroid  Peripheral vascular disease status post right first toe  amputation Continue with pain control and local wound care  Type 2 diabetes Last hemoglobin A1c 6.6 on 04/17/2018 Blood glucose is well controlled Continue sliding scale Blood sugar goal between 140 and 180 in the setting of recent surgery  COPD Continue with nebulizer treatment  A. Fib , rate controlled continue with Coreg Continue with Coumadin INR is therapeutic 2.3  CAD; stable Continue with beta-blocker and Imdur  Acute pain Continue with OxyIR and as needed morphine continue with Neurontin  Diastolic congestive heart failure last echo in 05/2016 showing ejection fraction of 60 to 65% Hold Lasix due to AKI Start strict I's and O's and daily weight    DVT Prophylaxis Coumadin   Code Status full  Disposition Plan:  Home versus SNF in 1 to 2 days or when vascular surgery signed off.    Objective: Vitals:   05/04/18 0218 05/04/18 0432 05/04/18 0451 05/04/18 0843  BP: (!) 104/59 (!) 132/48 (!) 130/56   Pulse: 68 68 66   Resp: 14 14    Temp: 98.7 F (37.1 C) 98.8 F (37.1 C)    TempSrc: Oral Oral    SpO2: 100% 100%  94%  Weight:      Height:        Intake/Output Summary (Last 24 hours) at 05/04/2018 1412 Last data filed at 05/04/2018 1357 Gross per 24 hour  Intake 1345.58 ml  Output 900 ml  Net 445.58 ml   Filed Weights   05/03/18 1104  Weight: 65.8 kg    Exam:  . General: 85 y.o. year-old female well developed well nourished in no acute distress.  Alert and oriented x3. . Cardiovascular: Regular  rate and rhythm with no rubs or gallops.  No thyromegaly or JVD noted.   [name] Respiratory: Clear to auscultation with no wheezes or rales. Good inspiratory effort. . Abdomen: Soft nontender nondistended with normal bowel sounds x4 quadrants. . Musculoskeletal: Trace lower extremity edema.  Right foot up to ankle in surgical dressing. [name] Psychiatry: Mood is appropriate for condition and setting   Data Reviewed: CBC: Recent Labs  Lab 05/03/18 1055    WBC 8.5  HGB 8.8*  HCT 27.6*  MCV 93.2  PLT 230   Basic Metabolic Panel: Recent Labs  Lab 05/03/18 1055  NA 142  K 4.5  CL 104  CO2 25  GLUCOSE 114*  BUN 84*  CREATININE 2.18*  CALCIUM 9.6   GFR: Estimated Creatinine Clearance: 16 mL/min (A) (by C-G formula based on SCr of 2.18 mg/dL (H)). Liver Function Tests: Recent Labs  Lab 05/03/18 1055  AST 42*  ALT 34  ALKPHOS 59  BILITOT 1.0  PROT 7.6  ALBUMIN 3.1*   No results for input(s): LIPASE, AMYLASE in the last 168 hours. No results for input(s): AMMONIA in the last 168 hours. Coagulation Profile: Recent Labs  Lab 05/03/18 1055 05/04/18 0333  INR 2.08 2.13   Cardiac Enzymes: No results for input(s): CKTOTAL, CKMB, CKMBINDEX, TROPONINI in the last 168 hours. BNP (last 3 results) No results for input(s): PROBNP in the last 8760 hours. HbA1C: No results for input(s): HGBA1C in the last 72 hours. CBG: Recent Labs  Lab 05/03/18 1346 05/03/18 1711 05/03/18 2053 05/04/18 0749 05/04/18 1153  GLUCAP 147* 179* 100* 169* 85   Lipid Profile: No results for input(s): CHOL, HDL, LDLCALC, TRIG, CHOLHDL, LDLDIRECT in the last 72 hours. Thyroid Function Tests: No results for input(s): TSH, T4TOTAL, FREET4, T3FREE, THYROIDAB in the last 72 hours. Anemia Panel: No results for input(s): VITAMINB12, FOLATE, FERRITIN, TIBC, IRON, RETICCTPCT in the last 72 hours. Urine analysis:    Component Value Date/Time   COLORURINE YELLOW 11/06/2017 1448   APPEARANCEUR CLEAR 11/06/2017 1448   LABSPEC 1.013 11/06/2017 1448   PHURINE 7.5 11/06/2017 1448   GLUCOSEU NEGATIVE 11/06/2017 1448   HGBUR NEGATIVE 11/06/2017 1448   BILIRUBINUR Neg 11/01/2017 1228   KETONESUR NEGATIVE 11/06/2017 1448   PROTEINUR NEGATIVE 11/06/2017 1448   UROBILINOGEN 0.2 11/01/2017 1228   UROBILINOGEN 0.2 01/27/2015 2308   NITRITE NEGATIVE 11/06/2017 1448   LEUKOCYTESUR 2+ (A) 11/06/2017 1448   Sepsis  Labs: @LABRCNTIP (procalcitonin:4,lacticidven:4)  ) Recent Results (from the past 240 hour(s))  WOUND CULTURE     Status: None   Collection Time: 05/01/18  3:00 PM  Result Value Ref Range Status   MICRO NUMBER: [id number]  Final   SPECIMEN QUALITY: Adequate  Final   SOURCE: NOT GIVEN  Final   STATUS: FINAL  Final   GRAM STAIN: No organisms or white blood cells seen  Final   RESULT:   Final    A mix of non-predominating organisms of questionable significance was recovered on culture and not further identified. (Note: Growth did not detected the presence of S.aureus, beta-hemolytic Streptococci or P.aeruginosa).  Surgical pcr screen     Status: None   Collection Time: 05/03/18 11:28 AM  Result Value Ref Range Status   MRSA, PCR NEGATIVE NEGATIVE Final   Staphylococcus aureus NEGATIVE NEGATIVE Final    Comment: (NOTE) The Xpert SA Assay (FDA approved for NASAL specimens in patients 110 years of age and older), is one component of a comprehensive surveillance program. It is not  intended to diagnose infection nor to guide or monitor treatment. Performed at [facility], [address] [place], [place] [address]       Studies: No results found.  Scheduled Meds: . artificial tears   Both Eyes QHS  . atorvastatin  10 mg Oral Daily  . calcium-vitamin D  1 tablet Oral Q breakfast  . carvedilol  6.25 mg Oral BID  . chlorhexidine  60 mL Topical Once   And  . chlorhexidine  60 mL Topical Once  . cycloSPORINE  1 drop Both Eyes BID  . feeding supplement (OSMOLITE 1.2 CAL)  711 mL Oral Q24H  . ferrous sulfate  325 mg Oral Daily  . gabapentin  300 mg Oral QHS  . hydrALAZINE  50 mg Oral TID  . insulin aspart  0-5 Units Subcutaneous QHS  . insulin aspart  0-9 Units Subcutaneous TID WC  . insulin glargine  5 Units Subcutaneous QHS  . isosorbide mononitrate  60 mg Oral Daily  . latanoprost  1 drop Both Eyes QHS  . levothyroxine  125 mcg Oral Q0600  . mometasone-formoterol  2 puff  Inhalation BID  . multivitamin with minerals  1 tablet Oral Daily  . pantoprazole  40 mg Oral BID  . polyvinyl alcohol  1 drop Both Eyes QID  . warfarin  5 mg Oral ONCE-1800  . Warfarin - Pharmacist Dosing Inpatient   Does not apply q1800    Continuous Infusions: . sodium chloride    . sodium chloride 75 mL/hr at 05/04/18 0504  . lactated ringers 10 mL/hr at 05/03/18 1128     LOS: 1 day     Darlin Drop, MD [facility] Pager [phone]  If 7PM-7AM, please contact night-coverage [email or web address] Password [email or web address] 05/04/2018, 2:12 PM

## 2018-05-04 NOTE — Progress Notes (Signed)
Subjective: Interval History: none.. Reports mild discomfort in her right foot  Objective: Vital signs in last 24 hours: Temp:  [97.4 F (36.3 C)-98.8 F (37.1 C)] 98.8 F (37.1 C) (02/01 0432) Pulse Rate:  [63-74] 66 (02/01 0451) Resp:  [11-19] 14 (02/01 0432) BP: (104-158)/(48-90) 130/56 (02/01 0451) SpO2:  [94 %-100 %] 94 % (02/01 0843)  Intake/Output from previous day: 01/31 0701 - 02/01 0700 In: 2325.6 [I.V.:1935.6; IV Piggyback:390] Out: 950 [Urine:900; Blood:50] Intake/Output this shift: Total I/O In: 20 [P.O.:60] Out: -   Dressing intact  Lab Results: Recent Labs    05/03/18 1055  WBC 8.5  HGB 8.8*  HCT 27.6*  PLT 230   BMET Recent Labs    05/03/18 1055  NA 142  K 4.5  CL 104  CO2 25  GLUCOSE 114*  BUN 84*  CREATININE 2.18*  CALCIUM 9.6    Studies/Results: Ir Gj Tube Change  Result Date: 04/08/2018 INDICATION: 85 year old female with a percutaneous gastrojejunostomy tube. The external portion has a hole in it. Patient presents for exchange for a new device. EXAM: JEJUNAL CATHETER REPLACEMENT MEDICATIONS: None ANESTHESIA/SEDATION: None CONTRAST:  15 mL Isovue-300-administered into the jejunal lumen. FLUOROSCOPY TIME:  Fluoroscopy Time: 4 minutes 18 seconds (30 mGy). COMPLICATIONS: None immediate. PROCEDURE: Informed written consent was obtained from the patient after a thorough discussion of the procedural risks, benefits and alternatives. All questions were addressed. Maximal Sterile Barrier Technique was utilized including caps, mask, sterile gowns, sterile gloves, sterile drape, hand hygiene and skin antiseptic. A timeout was performed prior to the initiation of the procedure. A hand injection of contrast material was performed through the existing tube. The distal tube is clogged. The injected contrast material spills out the hole in the external portion of the tube. Therefore, a wire was advanced through the tube and into the proximal jejunum. This  required multiple wires as well as a supporting 4 [place] glide catheter. Ultimately, a stiff glidewire was forced through the sidewall of the distal tube in the jejunum. The retention balloon was then deflated and the existing gastro jejunostomy tube was removed over the wire. A new 12 French GJ tube was then successfully navigated over the wire, through the stomach and into the proximal jejunum. The retention balloon was filled with 8 mL water and pulled snug against the anterior abdominal wall. The external bumper was fixed in place. IMPRESSION: Successful exchange for a new 22 French gastrojejunostomy tube. Electronically Signed   By: Jacqulynn Cadet M.D.   On: 04/08/2018 16:17   Dg Foot Complete Right  Result Date: 05/01/2018 Please see detailed radiograph report in office note.  Anti-infectives: Anti-infectives (From admission, onward)   Start     Dose/Rate Route Frequency Ordered Stop   05/03/18 1545  sulfamethoxazole-trimethoprim (BACTRIM DS,SEPTRA DS) 800-160 MG per tablet 1 tablet  Status:  Discontinued     1 tablet Oral 2 times daily 05/03/18 1534 05/03/18 1547   05/03/18 1055  vancomycin (VANCOCIN) IVPB 1000 mg/200 mL premix     1,000 mg 200 mL/hr over 60 Minutes Intravenous 60 min pre-op 05/03/18 1055 05/03/18 1228      Assessment/Plan: s/p Procedure(s): AMPUTATION RIGHT GREAT TOE (Right) Comfortable currently.  Will take down dressing tomorrow.  Marginal flow to right foot for healing ray amputation but concern regarding renal insufficiency.  Underwent extensive endovascular revascularization March 2019   LOS: 1 day   Curt Jews 05/04/2018, 1:55 PM

## 2018-05-04 NOTE — Progress Notes (Addendum)
Initial Nutrition Assessment  DOCUMENTATION CODES:  Not applicable  INTERVENTION:  Please note pt has a PEG-J tube d/t pyloric stenosis, feeding pump is to attach to J port. The G port is for venting only.   Restart Pt's home TF regimen:  Osmolite 1.2 @ 65 cc/hr x 12 hrs (780cc)  from 9pm-9am each day via J-tube.   TF alone provides: 936 kcals, 43g Pro, 640 mls fluid  D/t pyloric stenosis, pt only able to tolerate a FL diet. She takes oral supplements at home:   Will order 30 mL Prostat BID, each supplement provides 100 kcal and 15 grams of protein (usually takes Promod 30 ml BID orally at home).    Ensure Max protein supplement Q24s, each supplement provides 150kcal and 30g of protein.  Liquid MVI with minerals  Gave education to pt daughter regarding different TF formulas and oral supplements, the amount of protein/kcal/fluid they each contained, and how they would each impact patients glycemic control, diarrhea, wounds, kidney function and amount of time pt would need to be on pump  NUTRITION DIAGNOSIS:  Inadequate oral intake related to altered GI function(Pyloric stenosis) as evidenced by Apparent inability to meet needs w/ oral inake alone and a need for PEG-J tube and TF to supplement oral diet.  GOAL:  Patient will meet greater than or equal to 90% of their needs  MONITOR:  PO intake, Supplement acceptance, Diet advancement, Labs, I & O's  REASON FOR ASSESSMENT:  Consult Enteral/tube feeding initiation and management  ASSESSMENT:  85 y/o female PMHx CHF, CKD3, CVA, DM2, HLD/HTN, PVD, CAD, COPD, Depression. Pt has been dealing w/ nonhealing right toe ulcers refractory to conservative tx. Presented for amputation of R great toe.   RD consulted for tube feeding. All info obtain from pts daughter at bedside. Apparently, pt has a long history of pyloric stenosis. This had limited her intake to the point that a PEG-J was placed "many years ago". The pt is on a nocturnal TF  regimen at home: Osmolite 1.2 @ 65cc/hr x 12 hrs (9 pm - 9 am). She will occasionally use the G port for venting. Pt also receives 30 ml Promod BID each day, but she takes this orally. Pt reportedly has maintained her weight on this for many years (per chart, pts wt fluctuates often, but has been between 140-160 for since 2016)  Pt can tolerate a full liquid diet, but cannot have any fiber whatsoever d/t concern of occluding narrow pyloric channel. Apparently, her stomach even gets backed up with liquids. If she drinks too much, she "gets backed up" and this leads the pt to become nauseated. She apparently had been instructed to consume only small amounts at a time d/t exceedingly slow gastric emptying time- akin to a "clogged shower drain" analogy. Pt does drink oral supplements, recently she has been preferring premier protein, but has also consumed Boost, Ensure, and Nepro in past. However, because she can only tolerate small amounts-it takes the pt 2 days to consume 1 bottle of premier protein. The majority of her oral diet consists of juices, puddings, yogurts and soups that "dont have any pieces in them" ie (crm chick, crm tomato, crm potato). She apparently can maintain her hydration w/o tube flushes.  Regarding TF tolerance, the TF causes hyperglycemia. While not on TF, pts bg "is perfect, bg runs 110-120" but on TF, sugars jump to 150-200. Pt herself says she has some instances of stool incontinence (3x a day). RD asked why osmolite  was chosen and apparently it was recommended by a home health care agency d/t no fiber-however if tube this is passing the blockage unsure why fiber would be a problem. RD noted the fiber would help with diarrhea as well as glycemic control.   Daughter had several questions. She would like to see her mother liberated from TF altogether and maintain herself on oral intake alone. We discussed different oral supplements and pros/cons of each. RD noted how fat slows gastric  emptying-hence premier protein could choice. We also dicussed alternative TF formulas and the impact they would have. Pt herself does not really seem to want to change anything- "just leave it be". Given this is first time RD seeing pt, RD will keep pt on her long-time TF regimen. RD advised pt daughter to bring up topics we discussed when next they see her long time PCP.   Labs: BGs:85-170, Albumin: 3.1, H/h: 8.8/27.6, BUN/Creat:84/2.18 Meds: Oscal w/ D,  Iron, insulin, ppi, warfarin. IV abx, ivf  Recent Labs  Lab 05/03/18 1055  NA 142  K 4.5  CL 104  CO2 25  BUN 84*  CREATININE 2.18*  CALCIUM 9.6  GLUCOSE 114*   NUTRITION - FOCUSED PHYSICAL EXAM:   Most Recent Value  Orbital Region  No depletion  Upper Arm Region  No depletion  Thoracic and Lumbar Region  Mild depletion  Buccal Region  No depletion  Temple Region  No depletion  Clavicle Bone Region  No depletion  Clavicle and Acromion Bone Region  No depletion  Scapular Bone Region  No depletion  Dorsal Hand  No depletion  Patellar Region  Unable to assess  Anterior Thigh Region  Unable to assess  Posterior Calf Region  Unable to assess  Edema (RD Assessment)  Mild  Hair  Reviewed  Eyes  Reviewed  Mouth  Reviewed  Skin  Reviewed  Nails  Reviewed     Diet Order:   Diet Order            Diet full liquid Room service appropriate? Yes; Fluid consistency: Thin  Diet effective now             EDUCATION NEEDS:  Education needs have been addressed  Skin:   Surgical incision to R foot (s/p R great toe amputation)   Last BM:  1/30  Height:  Ht Readings from Last 1 Encounters:  05/03/18 5\' 7"  (1.702 m)   Weight:  Wt Readings from Last 1 Encounters:  05/03/18 65.8 kg   Wt Readings from Last 10 Encounters:  05/03/18 65.8 kg  04/17/18 67.9 kg  12/28/17 68 kg  12/04/17 76.1 kg  11/01/17 70.9 kg  10/05/17 70.8 kg  08/14/17 71.8 kg  08/08/17 71.2 kg  08/02/17 69.4 kg  07/27/17 71.2 kg   Ideal Body Weight:   61.36 kg  BMI:  Body mass index is 22.71 kg/m.  Estimated Nutritional Needs:  Kcal:  1500-1700 kcals (23-26 kcal/kg bw) Protein:  75-85g Pro (20% energy needs) Fluid:  >1.6 L (25 ml/kg bw)  Burtis Junes RD, LDN, CNSC Clinical Nutrition Available Tues-Sat via Pager: [phone] 05/04/2018 7:28 PM

## 2018-05-05 LAB — BASIC METABOLIC PANEL
Anion gap: 7 (ref 5–15)
BUN: 41 mg/dL — ABNORMAL HIGH (ref 8–23)
CO2: 23 mmol/L (ref 22–32)
Calcium: 8.8 mg/dL — ABNORMAL LOW (ref 8.9–10.3)
Chloride: 108 mmol/L (ref 98–111)
Creatinine, Ser: 1.45 mg/dL — ABNORMAL HIGH (ref 0.44–1.00)
GFR calc Af Amer: 36 mL/min — ABNORMAL LOW (ref >60)
GFR calc non Af Amer: 31 mL/min — ABNORMAL LOW (ref >60)
Glucose, Bld: 205 mg/dL — ABNORMAL HIGH (ref 70–99)
Potassium: 4.6 mmol/L (ref 3.5–5.1)
Sodium: 138 mmol/L (ref 135–145)

## 2018-05-05 LAB — OCCULT BLOOD X 1 CARD TO LAB, STOOL: Fecal Occult Bld: NEGATIVE

## 2018-05-05 LAB — CBC
HCT: 23.3 % — ABNORMAL LOW (ref 36.0–46.0)
Hemoglobin: 7.7 g/dL — ABNORMAL LOW (ref 12.0–15.0)
MCH: 30.6 pg (ref 26.0–34.0)
MCHC: 33 g/dL (ref 30.0–36.0)
MCV: 92.5 fL (ref 80.0–100.0)
Platelets: 208 10*3/uL (ref 150–400)
RBC: 2.52 MIL/uL — ABNORMAL LOW (ref 3.87–5.11)
RDW: 15.4 % (ref 11.5–15.5)
WBC: 7.7 10*3/uL (ref 4.0–10.5)
nRBC: 0 % (ref 0.0–0.2)

## 2018-05-05 LAB — GLUCOSE, CAPILLARY
Glucose-Capillary: 223 mg/dL — ABNORMAL HIGH (ref 70–99)
Glucose-Capillary: 176 mg/dL — ABNORMAL HIGH (ref 70–99)
Glucose-Capillary: 112 mg/dL — ABNORMAL HIGH (ref 70–99)
Glucose-Capillary: 95 mg/dL (ref 70–99)

## 2018-05-05 LAB — PROTIME-INR
INR: 2.31
Prothrombin Time: 25 seconds — ABNORMAL HIGH (ref 11.4–15.2)

## 2018-05-05 MED ORDER — WARFARIN SODIUM 5 MG PO TABS
5.0000 mg | ORAL_TABLET | ORAL | Status: DC
  Administered 2018-05-05 – 2018-05-08 (×4): 5 mg via ORAL
  Filled 2018-05-05 (×4): qty 1

## 2018-05-05 MED ORDER — WARFARIN SODIUM 2.5 MG PO TABS: 2.5000 mg | ORAL_TABLET | ORAL | Status: DC

## 2018-05-05 NOTE — Evaluation (Signed)
Clinical/Bedside Swallow Evaluation Patient Details  Name: Shannon [name] MRN: [id number] Date of Birth: 1923/05/02  Today's Date: 05/05/2018 Time: SLP Start Time (ACUTE ONLY): 1042 SLP Stop Time (ACUTE ONLY): 1103 SLP Time Calculation (min) (ACUTE ONLY): 21 min  Past Medical History:  Past Medical History:  Diagnosis Date  . Acute respiratory failure (HCC)   . Anemia    previous blood transfusions  . Arthritis    "all over"  . Asthma   . Bradycardia    requiring previous d/c of BB and reduction of amiodarone  . CAD (coronary artery disease)    nonobstructive per notes  . Chronic diastolic CHF (congestive heart failure) (HCC)   . CKD (chronic kidney disease), stage III (HCC)   . Complication of blood transfusion    "got the wrong blood type at [facility] in ~ 2015; no adverse reaction that we are aware of"/daughter, [name] (01/27/2016)  . COPD (chronic obstructive pulmonary disease) (HCC)   . Depression    "light case"  . DVT (deep venous thrombosis) (HCC) 01/2016   a. LLE DVT 01/2016 - switched from Eliquis to Coumadin.  [name] Dyspnea    with some activity  . Gastric stenosis    a. s/p stomach tube  . GERD (gastroesophageal reflux disease)   . History of blood transfusion    "several" (01/27/2016)  . History of stomach ulcers   . Hyperlipidemia   . Hypertension   . Hypothyroidism   . On home oxygen therapy    "2L; 9PM til 9AM" (06/27/2017)  . Paraesophageal hernia   . Perforated gastric ulcer (HCC)   . Peripheral vascular disease (HCC)   . Pneumonia    "a few times" (06/27/2017)  . Seasonal allergies   . SIADH (syndrome of inappropriate ADH production) (HCC)    [name] 01/10/2015  . Small bowel obstruction (HCC)    "I don't know how many" (01/11/2015)  . Stroke [facility])    several- no residual  . Type II diabetes mellitus (HCC)    "related to prednisone use  for > 20 yr; once predinose stopped; no more DM RX" (01/27/2016)  . UTI (urinary tract infection) 02/08/2016  .  Ventral hernia with bowel obstruction    Past Surgical History:  Past Surgical History:  Procedure Laterality Date  . ABDOMINAL AORTOGRAM N/A 09/21/2016   Procedure: Abdominal Aortogram;  Surgeon: Maeola Harman, MD;  Location: [facility] INVASIVE CV LAB;  Service: Cardiovascular;  Laterality: N/A;  . AMPUTATION Left 09/25/2016   Procedure: AMPUTATION DIGIT- LEFT 2ND AND 3RD TOES;  Surgeon: Larina Earthly, MD;  Location: [facility] OR;  Service: Vascular;  Laterality: Left;  . CATARACT EXTRACTION W/ INTRAOCULAR LENS  IMPLANT, BILATERAL    . CHOLECYSTECTOMY OPEN    . COLECTOMY    . ESOPHAGOGASTRODUODENOSCOPY N/A 01/19/2014   Procedure: ESOPHAGOGASTRODUODENOSCOPY (EGD);  Surgeon: Hilarie Fredrickson, MD;  Location: [facility] ENDOSCOPY;  Service: Endoscopy;  Laterality: N/A;  . ESOPHAGOGASTRODUODENOSCOPY N/A 01/20/2014   Procedure: ESOPHAGOGASTRODUODENOSCOPY (EGD);  Surgeon: Hilarie Fredrickson, MD;  Location: [facility] ENDOSCOPY;  Service: Endoscopy;  Laterality: N/A;  . ESOPHAGOGASTRODUODENOSCOPY N/A 03/19/2014   Procedure: ESOPHAGOGASTRODUODENOSCOPY (EGD);  Surgeon: Rachael Fee, MD;  Location: [facility] ENDOSCOPY;  Service: Endoscopy;  Laterality: N/A;  . ESOPHAGOGASTRODUODENOSCOPY N/A 07/08/2015   Procedure: ESOPHAGOGASTRODUODENOSCOPY (EGD);  Surgeon: Sherrilyn Rist, MD;  Location: [facility] ENDOSCOPY;  Service: Endoscopy;  Laterality: N/A;  . ESOPHAGOGASTRODUODENOSCOPY (EGD) WITH PROPOFOL N/A 09/15/2015   Procedure: ESOPHAGOGASTRODUODENOSCOPY (EGD) WITH PROPOFOL;  Surgeon: Viviann Spare  Maylon Peppers, MD;  Location: [facility] ENDOSCOPY;  Service: Gastroenterology;  Laterality: N/A;  . GASTROJEJUNOSTOMY     hx/notes 01/10/2015  . GASTROJEJUNOSTOMY N/A 09/23/2015   Procedure: OPEN GASTROJEJUNOSTOMY TUBE PLACEMENT;  Surgeon: De Blanch Kinsinger, MD;  Location: [facility] ORS;  Service: General;  Laterality: N/A;  . GLAUCOMA SURGERY Bilateral   . HERNIA REPAIR  2015  . IR CM INJ ANY COLONIC TUBE W/FLUORO  01/05/2017  . IR CM INJ ANY COLONIC TUBE W/FLUORO   06/07/2017  . IR CM INJ ANY COLONIC TUBE W/FLUORO  11/05/2017  . IR GENERIC HISTORICAL  01/07/2016   IR GJ TUBE CHANGE 01/07/2016 Malachy Moan, MD [facility]  . IR GENERIC HISTORICAL  01/27/2016   IR MECH REMOV OBSTRUC MAT ANY COLON TUBE W/FLUORO 01/27/2016 Richarda Overlie, MD [facility]  . IR GENERIC HISTORICAL  02/07/2016   IR PATIENT EVAL TECH 0-60 MINS Darrell K Allred, PA-C [facility]  . IR GENERIC HISTORICAL  02/08/2016   IR GJ TUBE CHANGE 02/08/2016 Berdine Dance, MD [facility]  . IR GENERIC HISTORICAL  01/06/2016   IR GJ TUBE CHANGE 01/06/2016 [facility] OUT REF  . IR GENERIC HISTORICAL  05/02/2016   IR CM INJ ANY COLONIC TUBE W/FLUORO 05/02/2016 Oley Balm, MD [facility]  . IR GENERIC HISTORICAL  05/15/2016   IR GJ TUBE CHANGE 05/15/2016 Simonne Come, MD [facility]  . IR GENERIC HISTORICAL  06/28/2016   IR GJ TUBE CHANGE 06/28/2016 [facility]  . IR GJ TUBE CHANGE  02/20/2017  . IR GJ TUBE CHANGE  05/10/2017  . IR GJ TUBE CHANGE  07/06/2017  . IR GJ TUBE CHANGE  08/02/2017  . IR GJ TUBE CHANGE  10/15/2017  . IR GJ TUBE CHANGE  12/26/2017  . IR GJ TUBE CHANGE  04/08/2018  . IR PATIENT EVAL TECH 0-60 MINS  10/19/2016  . IR PATIENT EVAL TECH 0-60 MINS  12/25/2016  . IR PATIENT EVAL TECH 0-60 MINS  01/29/2017  . IR PATIENT EVAL TECH 0-60 MINS  04/04/2017  . IR PATIENT EVAL TECH 0-60 MINS  04/30/2017  . IR PATIENT EVAL TECH 0-60 MINS  07/31/2017  . IR REPLC DUODEN/JEJUNO TUBE PERCUT W/FLUORO  11/14/2016  . LAPAROTOMY N/A 01/20/2015   Procedure: EXPLORATORY LAPAROTOMY;  Surgeon: Abigail Miyamoto, MD;  Location: [facility] OR;  Service: General;  Laterality: N/A;  . LOWER EXTREMITY ANGIOGRAPHY Left 09/21/2016   Procedure: Lower Extremity Angiography;  Surgeon: Maeola Harman, MD;  Location: [facility] INVASIVE CV LAB;  Service: Cardiovascular;  Laterality: Left;  . LOWER EXTREMITY ANGIOGRAPHY Right 06/27/2017   Procedure: Lower Extremity Angiography;  Surgeon: Maeola Harman, MD;  Location:  [facility] INVASIVE CV LAB;  Service: Cardiovascular;  Laterality: Right;  . LYSIS OF ADHESION N/A 01/20/2015   Procedure: LYSIS OF ADHESIONS < 1 HOUR;  Surgeon: Abigail Miyamoto, MD;  Location: [facility] OR;  Service: General;  Laterality: N/A;  . PERIPHERAL VASCULAR BALLOON ANGIOPLASTY Left 09/21/2016   Procedure: Peripheral Vascular Balloon Angioplasty;  Surgeon: Maeola Harman, MD;  Location: [facility] INVASIVE CV LAB;  Service: Cardiovascular;  Laterality: Left;  drug coated balloon  . PERIPHERAL VASCULAR BALLOON ANGIOPLASTY Right 06/27/2017   Procedure: PERIPHERAL VASCULAR BALLOON ANGIOPLASTY;  Surgeon: Maeola Harman, MD;  Location: [facility] INVASIVE CV LAB;  Service: Cardiovascular;  Laterality: Right;  SFA/TPTRUNK  . TONSILLECTOMY    . TUBAL LIGATION    . VENTRAL HERNIA REPAIR  2015   incarcerated ventral hernia ([facility] 09/2013)/notes 01/10/2015   HPI:  Shannon [name] is a 85 y.o. female nonhealing right toe ulcers.  Had been followed by Dr. Logan Bores with podiatry.  The family has requested that we proceed with amputation.  She had had similar issues in her left foot with eventual healing after revascularization.  She has a history of multiple medical problems.   She is on chronic O2 at home at 2 liters/minute and is on a total thin liquid diet.  There is no recent chest or head imaging.     Assessment / Plan / Recommendation Clinical Impression  Clinical swallowing evaluation was completed using thin liquids only.  The patient is well known to ST service from previous admissions due to multi-factorial dysphagia.  Per patient, prior to this admission she was on a thin liquid only diet with no intake of solids foods with tube feeds overnight.  Her last MBS was dated 05/15/2016 which did show silent aspiration of thin liquids.  She was recommended for a nectar thick liquid only diet.  It appeared that due to her esophageal issues she was not taking any type of solid material orally.  The patient reported that  she had not thickened her liquids in some time.  She did not report any recent acute respiratory issues.   Cranial nerve exam was completed and unremarkable.  Lingual, labial, facial and jaw range of motion and strength appeared to be adequate.  Voltional cough sounded slightly weak.  Vocal volume was adequate.  Given thin liquids via spoon and cup the patient did not present with any overt s/s of aspiration.  Vocal quality did not change.  However, per previous MBS patient was known to silently aspirate.  Suggested repeat MBS to the patient vs just continuing with the thin liquid oral diet with the possibility of aspiration and the patient declined MBS.  Her desire was to continue on the thin liquid diet with her overnight tube feeds.  Encouraged good oral care using a toothbrush and toothpaste which the patient currently does not implement.  She reported use of swabs only, however, the swabs will not remove all the biofilm and reduce risk if she is in fact still aspirating.   ST will follow briefly for education.     SLP Visit Diagnosis: Dysphagia, unspecified (R13.10)    Aspiration Risk  Moderate aspiration risk    Diet Recommendation  Thin liquid only oral diet - the patient does not eat any type of solid foods   Medication Administration: Via alternative means    Other  Recommendations Oral Care Recommendations: Oral care QID   Follow up Recommendations None      Frequency and Duration min 2x/week  2 weeks       Prognosis Prognosis for Safe Diet Advancement: Guarded      Swallow Study   General Date of Onset: 05/03/18 HPI: Shannon [name] is a 85 y.o. female nonhealing right toe ulcers.  Had been followed by Dr. Logan Bores with podiatry.  The family has requested that we proceed with amputation.  She had had similar issues in her left foot with eventual healing after revascularization.  She has a history of multiple medical problems.   She is on chronic O2 at home at 2 liters/minute and is  on a total thin liquid diet.  There is no recent chest or head imaging.   Type of Study: Bedside Swallow Evaluation Previous Swallow Assessment: She is well known to ST service.  Most recent MBS dated 05/15/2016 which was showing silent  aspiration of thin liquids.   Diet Prior to this Study: Other (Comment)(Full liquids) Temperature Spikes Noted: No Respiratory Status: Nasal cannula Behavior/Cognition: Alert;Cooperative;Pleasant mood Oral Cavity Assessment: Within Functional Limits Oral Care Completed by SLP: No Oral Cavity - Dentition: Edentulous Vision: Functional for self-feeding Self-Feeding Abilities: Able to feed self Patient Positioning: Upright in bed Baseline Vocal Quality: Normal Volitional Cough: Strong Volitional Swallow: Able to elicit    Oral/Motor/Sensory Function Overall Oral Motor/Sensory Function: Within functional limits   Ice Chips Ice chips: Not tested   Thin Liquid Thin Liquid: Within functional limits Presentation: Spoon;Cup    Nectar Thick Nectar Thick Liquid: Not tested   Honey Thick Honey Thick Liquid: Not tested   Puree Puree: Not tested   Solid     Solid: Not tested      Fleet Contras 05/05/2018,11:18 AM

## 2018-05-05 NOTE — Progress Notes (Addendum)
PROGRESS NOTE  RAYLEY CASO NWG:[id number] DOB: 1923/09/16 DOA: 05/03/2018 PCP: Sharon Seller, NP  HPI/Recap of past 24 hours: [name] a85 y.o.female,with past medical history significant for CAD, A. fib on warfarin, history of CVA, hypothyroidism, hypertension, diabetes mellitus, chronic diastolic congestive heart failure and PCM/dysphagia status post PEG tube placement, chronic, who was admitted originally today for right big toe amputation.Patient tolerated surgery well and is being admitted today due to complex medical issues mentioned above. Patient denies any chest pains, shortness of breath, nausea vomiting or diarrhea. Patient seen postop. Her main complaint is right foot pain.  05/04/2018: POD #1 post right big toe amputation.  Patient seen and examined at bedside.  No acute events overnight.  She is alert and oriented x3.  Reports minimal pain on her right lower extremity.  She requests a clear liquid diet.  PEG tube in place.  No new complaints.  05/05/2018: Seen and examined at her bedside. No acute events overnight.  She has no new complaints.  Blood pressure is mildly elevated this morning.  She is on 3 antihypertensive medications that are due this morning.  Assessment/Plan: Active Problems:   Peripheral vascular disease (HCC)  Gangrene right great toe status post right big toe amputation by vascular surgery Presented with gangrene of right great toe Right great toe amputation on 05/03/2018 Continue pain management Dressing removal by vascular surgery on 05/05/2018 PT OT to assess for physical rehab needs  AKI on CKD 3 Baseline creatinine appears to be 1.49 on 04/17/18 with GFR of 34 Presented with creatinine of 2.18 Completed gentle IV fluid hydration Continue to monitor urine output; 1500 cc of urine output in the last 24 hours BMP in the morning  Acute blood loss anemia status post surgery Baseline hemoglobin appears to be 10 Hemoglobin  continues to drop 7.7 from 8.8 yesterday Continue ferrous sulfate 325 mg daily Obtain FOBT if able to have a bowel movement Repeat CBC in the morning  Acute on chronic hypoxic respiratory failure At baseline she is on 2 L of oxygen by nasal cannula at night only Currently on 2 L saturating 97% Wean off oxygen supplementation as tolerated Maintain O2 saturation greater than 92% No chest imaging done during this admission Lungs exam was benign  Dysphagia status post PEG tube placement Continue PEG tube feeding Dietitian consult  Hypothyroidism Continue Synthroid  Peripheral vascular disease status post right first toe amputation Continue with pain control and local wound care  Type 2 diabetes Last hemoglobin A1c 6.6 on 04/17/2018 Blood glucose is well controlled Continue sliding scale Blood sugar goal between 140 and 180 in the setting of recent surgery  COPD Continue with nebulizer treatment  A. Fib,rate controlled continue with Coreg Continue with Coumadin Therapeutic INR  CAD; stable Continue with beta-blocker and Imdur  Acute pain Continue with OxyIR and as needed morphine continue with Neurontin  Diastolic congestive heart failure last echo in 05/2016 showing ejection fraction of 60 to 65% Hold Lasix due to AKI Continue strict I's and O's and daily weight    DVT ProphylaxisCoumadin   Code Statusfull  Disposition Plan: Home versus SNF in 1 to 2 days or when vascular surgery signs off.    Objective:       Vitals:   05/04/18 1508 05/04/18 2038 05/04/18 2135 05/05/18 0609  BP: (!) 146/53  131/68 (!) 163/50  Pulse: 68  67 71  Resp: 14  16 16   Temp: 99.7 F (37.6 C)  99 F (37.2  C) 99.2 F (37.3 C)  TempSrc: Oral  Oral Oral  SpO2: 100% 99% 97% 97%  Weight:      Height:        Intake/Output Summary (Last 24 hours) at 05/05/2018 1005 Last data filed at 05/05/2018 2536    Gross per 24 hour  Intake 210 ml    Output 1500 ml  Net -1290 ml      Filed Weights   05/03/18 1104  Weight: 65.8 kg    Exam:   General: 85 y.o. year-old female well-developed well-nourished in no acute distress.  Alert and oriented x3.    Cardiovascular: Regular rate and rhythm with no rubs or gallops.  No JVD or thyromegaly noted.    Respiratory: Clear to auscultation with no wheezes or rales.  Good inspiratory effort.  Abdomen: Soft nontender nondistended with normal bowel sounds x4 quadrants.  Musculoskeletal: Trace lower extremity edema.  Right foot up to ankle in surgical dressing.  Psychiatry: Mood is appropriate for condition and setting   Data Reviewed: CBC: LastLabs      Recent Labs  Lab 05/03/18 1055 05/05/18 0446  WBC 8.5 7.7  HGB 8.8* 7.7*  HCT 27.6* 23.3*  MCV 93.2 92.5  PLT 230 208     Basic Metabolic Panel: LastLabs      Recent Labs  Lab 05/03/18 1055 05/05/18 0446  NA 142 138  K 4.5 4.6  CL 104 108  CO2 25 23  GLUCOSE 114* 205*  BUN 84* 41*  CREATININE 2.18* 1.45*  CALCIUM 9.6 8.8*     GFR: Estimated Creatinine Clearance: 24.1 mL/min (A) (by C-G formula based on SCr of 1.45 mg/dL (H)). Liver Function Tests: LastLabs     Recent Labs  Lab 05/03/18 1055  AST 42*  ALT 34  ALKPHOS 59  BILITOT 1.0  PROT 7.6  ALBUMIN 3.1*     LastLabs  No results for input(s): LIPASE, AMYLASE in the last 168 hours.   LastLabs  No results for input(s): AMMONIA in the last 168 hours.   Coagulation Profile: LastLabs  Recent Labs  Lab 05/03/18 1055 05/04/18 0333 05/05/18 0446  INR 2.08 2.13 2.31     Cardiac Enzymes: LastLabs  No results for input(s): CKTOTAL, CKMB, CKMBINDEX, TROPONINI in the last 168 hours.   BNP (last 3 results) RecentLabs(withinlast365days)  No results for input(s): PROBNP in the last 8760 hours.   HbA1C: RecentLabs(last2labs)  No results for input(s): HGBA1C in the last 72 hours.   CBG: LastLabs          Recent Labs  Lab 05/04/18 0749 05/04/18 1153 05/04/18 1711 05/04/18 2132 05/05/18 0758  GLUCAP 169* 85 102* 125* 223*     Lipid Profile: RecentLabs(last2labs)  No results for input(s): CHOL, HDL, LDLCALC, TRIG, CHOLHDL, LDLDIRECT in the last 72 hours.   Thyroid Function Tests: RecentLabs(last2labs)  No results for input(s): TSH, T4TOTAL, FREET4, T3FREE, THYROIDAB in the last 72 hours.   Anemia Panel: RecentLabs(last2labs)  No results for input(s): VITAMINB12, FOLATE, FERRITIN, TIBC, IRON, RETICCTPCT in the last 72 hours.   Urine analysis: Labs(Brief)          Component Value Date/Time   COLORURINE YELLOW 11/06/2017 1448   APPEARANCEUR CLEAR 11/06/2017 1448   LABSPEC 1.013 11/06/2017 1448   PHURINE 7.5 11/06/2017 1448   GLUCOSEU NEGATIVE 11/06/2017 1448   HGBUR NEGATIVE 11/06/2017 1448   BILIRUBINUR Neg 11/01/2017 1228   KETONESUR NEGATIVE 11/06/2017 1448   PROTEINUR NEGATIVE 11/06/2017 1448   UROBILINOGEN 0.2 11/01/2017  1228   UROBILINOGEN 0.2 01/27/2015 2308   NITRITE NEGATIVE 11/06/2017 1448   LEUKOCYTESUR 2+ (A) 11/06/2017 1448     Sepsis Labs: @LABRCNTIP (procalcitonin:4,lacticidven:4)  ) Recent Results (from the past 240 hour(s))  WOUND CULTURE     Status: None   Collection Time: 05/01/18  3:00 PM  Result Value Ref Range Status   MICRO NUMBER: [id number]  Final   SPECIMEN QUALITY: Adequate  Final   SOURCE: NOT GIVEN  Final   STATUS: FINAL  Final   GRAM STAIN: No organisms or white blood cells seen  Final   RESULT:   Final    A mix of non-predominating organisms of questionable significance was recovered on culture and not further identified. (Note: Growth did not detected the presence of S.aureus, beta-hemolytic Streptococci or P.aeruginosa).  Surgical pcr screen     Status: None   Collection Time: 05/03/18 11:28 AM  Result Value Ref Range Status   MRSA, PCR NEGATIVE NEGATIVE Final   Staphylococcus  aureus NEGATIVE NEGATIVE Final    Comment: (NOTE) The Xpert SA Assay (FDA approved for NASAL specimens in patients 31 years of age and older), is one component of a comprehensive surveillance program. It is not intended to diagnose infection nor to guide or monitor treatment. Performed at [facility], [address], [place], [place] [address]       Studies: No results found.  Scheduled Meds: . artificial tears   Both Eyes QHS  . atorvastatin  10 mg Oral Daily  . calcium-vitamin D  1 tablet Oral Q breakfast  . carvedilol  6.25 mg Oral BID  . chlorhexidine  60 mL Topical Once   And  . chlorhexidine  60 mL Topical Once  . cycloSPORINE  1 drop Both Eyes BID  . feeding supplement (PRO-STAT SUGAR FREE 64)  30 mL Oral BID  . ferrous sulfate  325 mg Oral Daily  . gabapentin  300 mg Oral QHS  . hydrALAZINE  50 mg Oral TID  . insulin aspart  0-5 Units Subcutaneous QHS  . insulin aspart  0-9 Units Subcutaneous TID WC  . insulin glargine  5 Units Subcutaneous QHS  . isosorbide mononitrate  60 mg Oral Daily  . latanoprost  1 drop Both Eyes QHS  . levothyroxine  125 mcg Oral Q0600  . mometasone-formoterol  2 puff Inhalation BID  . multivitamin with minerals  1 tablet Oral Daily  . pantoprazole  40 mg Oral BID  . polyvinyl alcohol  1 drop Both Eyes QID  . ENSURE MAX PROTEIN  11 oz Oral Daily  . Warfarin - Pharmacist Dosing Inpatient   Does not apply q1800    Continuous Infusions: . sodium chloride    . lactated ringers 10 mL/hr at 05/03/18 1128     LOS: 2 days     Darlin Drop, MD [facility] Pager [phone]  If 7PM-7AM, please contact night-coverage [email or web address] Password [email or web address] 05/05/2018, 10:05 AM

## 2018-05-05 NOTE — Progress Notes (Signed)
[place] for warfarin Indication: atrial fibrillation  Allergies  Allergen Reactions  . Penicillins Itching, Rash and Other (See Comments)    Tolerated amoxicillin, unasyn, zosyn & cephalosporins in the past. Did it involve swelling of the face/tongue/throat, SOB, or low BP? No Did it involve sudden or severe rash/hives, skin peeling, or any reaction on the inside of your mouth or nose? No Did you need to seek medical attention at a hospital or doctor's office? Unknown When did it last happen?5+ years If all above answers are "NO", may proceed with cephalosporin use.     Patient Measurements: Height: 5\' 7"  (170.2 cm) Weight: 145 lb (65.8 kg) IBW/kg (Calculated) : 61.6   Vital Signs: Temp: 99.2 F (37.3 C) (02/02 0609) Temp Source: Oral (02/02 0609) BP: 163/50 (02/02 0609) Pulse Rate: 71 (02/02 0609)  Labs: Recent Labs    05/03/18 1055 05/04/18 0333 05/05/18 0446  HGB 8.8*  --  7.7*  HCT 27.6*  --  23.3*  PLT 230  --  208  LABPROT 23.2* 23.5* 25.0*  INR 2.08 2.13 2.31  CREATININE 2.18*  --  1.45*    Estimated Creatinine Clearance: 24.1 mL/min (A) (by C-G formula based on SCr of 1.45 mg/dL (H)).  Assessment: 85 yo female admitted s/p right great toe amputation. Patient on warfarin PTA for atrial fibrillation. Pharmacy consulted to manage warfarin.   INR therapeutic at 2.31. No bleeding noted, Hgb down to 7.7, platelets are stable.  PTA: 5mg  daily except 2.5mg  on Fridays  Goal of Therapy:  INR 2-3 Monitor platelets by anticoagulation protocol: Yes   Plan:  Warfarin 5 mg PO daily except 2.5 mg on Fri Daily INR Monitor for s/sx bleeding   Renold Genta, PharmD, BCPS Clinical Pharmacist Clinical phone for 05/05/2018 until 3p is [email or web address] 05/05/2018 11:36 AM  **Pharmacist phone directory can now be found on [email or web address] listed under [email or web address]**

## 2018-05-05 NOTE — Evaluation (Signed)
Physical Therapy Evaluation Patient Details Name: Shannon [name] MRN: [id number] DOB: 1923-06-21 Today's Date: 05/05/2018   History of Present Illness  85 y.o. female, with past medical history significant for CAD, A. fib on warfarin, history of CVA, hypothyroidism, hypertension, diabetes mellitus, chronic diastolic congestive heart failure and PCM/dysphagia status post PEG tube placement, chronic, admitted for R great toe pain, dx with gangrene and is s/p R GT ampuation 05/04/18   Clinical Impression  PTA pt reports living with daughter in single story home with ramped entrance. Pt was ambulating with a Rollator household distances and required assist to get into bathtub via tub bench but was independent in bathing and dressing. Pt is currently limited in safe mobility by R foot pain and uneven footing created by use of Darco shoe. Pt is min guard for bed mobility, and modA for transfers with RW. PT recommending SNF level rehab due to decreased mobility however pt and family are likely to decline, in which case pt will require wheelchair for safe mobility in her home. Pt reports that daughter utilizes Rollator to push pt around. This is an unsafe practice especially in light of recent surgery. PT will follow acutely.    Follow Up Recommendations SNF    Equipment Recommendations  Wheelchair cushion (measurements PT);Wheelchair (measurements PT)    Recommendations for Other Services       Precautions / Restrictions Precautions Precautions: Fall Required Braces or Orthoses: Other Brace(R Darco shoe for ambulation  ) Restrictions Weight Bearing Restrictions: Yes RLE Weight Bearing: Weight bearing as tolerated(Darco shoe)      Mobility  Bed Mobility Overal bed mobility: Needs Assistance Bed Mobility: Supine to Sit     Supine to sit: Min guard;HOB elevated     General bed mobility comments: min guard for safety, increased time and effort, with use of bed rails to pull to EoB    Transfers Overall transfer level: Needs assistance Equipment used: Rolling walker (2 wheeled) Transfers: Sit to/from [employer] Sit to Stand: Mod assist Stand pivot transfers: Mod assist       General transfer comment: modA for power up and steadying with RW, decreased stability due to Darco shoe, vc for offweighting L heel to pivot on ball of foot to chair                 Balance Overall balance assessment: Needs assistance Sitting-balance support: Feet supported;No upper extremity supported Sitting balance-Leahy Scale: Fair     Standing balance support: Bilateral upper extremity supported;During functional activity Standing balance-Leahy Scale: Poor Standing balance comment: requires UE support for steadying                             Pertinent Vitals/Pain Pain Assessment: Faces Faces Pain Scale: Hurts whole lot Pain Location: R foot with weightbearing Pain Descriptors / Indicators: Grimacing;Guarding;Operative site guarding Pain Intervention(s): Limited activity within patient's tolerance;Monitored during session;Premedicated before session    Home Living Family/patient expects to be discharged to:: Private residence Living Arrangements: Children;Other relatives Available Help at Discharge: Family;Available 24 hours/day Type of Home: House Home Access: Ramped entrance     Home Layout: One level Home Equipment: Walker - 4 wheels;Cane - single point;Hand held Careers information officer - 2 wheels;Bedside commode;Tub bench      Prior Function Level of Independence: Needs assistance   Gait / Transfers Assistance Needed: Reports she uses rollator for short ambulation distances and did not require assist.  ADL's / Homemaking Assistance Needed: Reports daughter helps transfer into tub, however, pt is able to bathe herself. Reports she needs assist for IADLs.         Hand Dominance   Dominant Hand: Right    Extremity/Trunk  Assessment   Upper Extremity Assessment Upper Extremity Assessment: RUE deficits/detail RUE Deficits / Details: R shoulder pain and limited ROM    Lower Extremity Assessment Lower Extremity Assessment: RLE deficits/detail RLE Deficits / Details: R foot and ankle wrapped limiting ROM, knee and hip ROM WFL, strength grossly 3+/5 RLE Sensation: decreased light touch RLE Coordination: decreased fine motor    Cervical / Trunk Assessment Cervical / Trunk Assessment: Kyphotic  Communication   Communication: No difficulties  Cognition Arousal/Alertness: Awake/alert Behavior During Therapy: WFL for tasks assessed/performed Overall Cognitive Status: Within Functional Limits for tasks assessed                                        General Comments General comments (skin integrity, edema, etc.): R foot dressing clean, dry and intact on entry, Darco shoe placed for transfer, pt with increased difficulty with steadying on shoe, will place L normal shoe for mobility in future Pt on 2 L O2 via  on entry with SaO2 100%O2, pt states she only uses oxygen at night at home. Supplemental O2 removed and SaO2 remained >95%O2 on RA throughout session    Exercises     Assessment/Plan    PT Assessment Patient needs continued PT services  PT Problem List Decreased strength;Decreased activity tolerance;Decreased balance;Decreased mobility;Impaired sensation;Decreased coordination;Pain       PT Treatment Interventions DME instruction;Gait training;Stair training;Therapeutic activities;Therapeutic exercise;Balance training;Patient/family education;Wheelchair mobility training    PT Goals (Current goals can be found in the Care Plan section)  Acute Rehab PT Goals Patient Stated Goal: go home PT Goal Formulation: With patient Time For Goal Achievement: 05/19/18 Potential to Achieve Goals: Fair    Frequency Min 5X/week    AM-PAC PT "6 Clicks" Mobility  Outcome Measure Help needed  turning from your back to your side while in a flat bed without using bedrails?: A Little Help needed moving from lying on your back to sitting on the side of a flat bed without using bedrails?: A Lot Help needed moving to and from a bed to a chair (including a wheelchair)?: A Lot Help needed standing up from a chair using your arms (e.g., wheelchair or bedside chair)?: A Lot Help needed to walk in hospital room?: Total Help needed climbing 3-5 steps with a railing? : Total 6 Click Score: 11    End of Session Equipment Utilized During Treatment: Gait belt Activity Tolerance: Patient limited by pain Patient left: in chair;with call bell/phone within reach;with chair alarm set Nurse Communication: Mobility status;Weight bearing status PT Visit Diagnosis: Unsteadiness on feet (R26.81);Other abnormalities of gait and mobility (R26.89);Muscle weakness (generalized) (M62.81);Difficulty in walking, not elsewhere classified (R26.2);Pain Pain - Right/Left: Right Pain - part of body: Ankle and joints of foot    Time: 1136-1202 PT Time Calculation (min) (ACUTE ONLY): 26 min   Charges:   PT Evaluation $PT Eval Moderate Complexity: 1 Mod PT Treatments $Gait Training: 8-22 mins        Jadriel Saxer B. Beverely Risen PT, DPT Acute Rehabilitation Services Pager [phone] Office [phone]   Elon Alas [facility] 05/05/2018, 12:57 PM

## 2018-05-05 NOTE — Progress Notes (Signed)
Subjective: Interval History: none.. Alert and oriented this morning.  Reports mild discomfort  Objective: Vital signs in last 24 hours: Temp:  [99 F (37.2 C)-99.7 F (37.6 C)] 99.2 F (37.3 C) (02/02 0609) Pulse Rate:  [67-71] 71 (02/02 0609) Resp:  [14-16] 16 (02/02 0609) BP: (131-163)/(50-68) 163/50 (02/02 0609) SpO2:  [97 %-100 %] 97 % (02/02 0609)  Intake/Output from previous day: 02/01 0701 - 02/02 0700 In: 270 [P.O.:270] Out: 1500 [Urine:1500] Intake/Output this shift: No intake/output data recorded.  Dressing removed.  Great toe ray amputation appears to be healing with viable skin edges.  Lab Results: Recent Labs    05/03/18 1055 05/05/18 0446  WBC 8.5 7.7  HGB 8.8* 7.7*  HCT 27.6* 23.3*  PLT 230 208   BMET Recent Labs    05/03/18 1055 05/05/18 0446  NA 142 138  K 4.5 4.6  CL 104 108  CO2 25 23  GLUCOSE 114* 205*  BUN 84* 41*  CREATININE 2.18* 1.45*  CALCIUM 9.6 8.8*    Studies/Results: Ir Gj Tube Change  Result Date: 04/08/2018 INDICATION: 85 year old female with a percutaneous gastrojejunostomy tube. The external portion has a hole in it. Patient presents for exchange for a new device. EXAM: JEJUNAL CATHETER REPLACEMENT MEDICATIONS: None ANESTHESIA/SEDATION: None CONTRAST:  15 mL Isovue-300-administered into the jejunal lumen. FLUOROSCOPY TIME:  Fluoroscopy Time: 4 minutes 18 seconds (30 mGy). COMPLICATIONS: None immediate. PROCEDURE: Informed written consent was obtained from the patient after a thorough discussion of the procedural risks, benefits and alternatives. All questions were addressed. Maximal Sterile Barrier Technique was utilized including caps, mask, sterile gowns, sterile gloves, sterile drape, hand hygiene and skin antiseptic. A timeout was performed prior to the initiation of the procedure. A hand injection of contrast material was performed through the existing tube. The distal tube is clogged. The injected contrast material spills out  the hole in the external portion of the tube. Therefore, a wire was advanced through the tube and into the proximal jejunum. This required multiple wires as well as a supporting 4 [place] glide catheter. Ultimately, a stiff glidewire was forced through the sidewall of the distal tube in the jejunum. The retention balloon was then deflated and the existing gastro jejunostomy tube was removed over the wire. A new 50 French GJ tube was then successfully navigated over the wire, through the stomach and into the proximal jejunum. The retention balloon was filled with 8 mL water and pulled snug against the anterior abdominal wall. The external bumper was fixed in place. IMPRESSION: Successful exchange for a new 22 French gastrojejunostomy tube. Electronically Signed   By: Jacqulynn Cadet M.D.   On: 04/08/2018 16:17   Dg Foot Complete Right  Result Date: 05/01/2018 Please see detailed radiograph report in office note.  Anti-infectives: Anti-infectives (From admission, onward)   Start     Dose/Rate Route Frequency Ordered Stop   05/03/18 1545  sulfamethoxazole-trimethoprim (BACTRIM DS,SEPTRA DS) 800-160 MG per tablet 1 tablet  Status:  Discontinued     1 tablet Oral 2 times daily 05/03/18 1534 05/03/18 1547   05/03/18 1055  vancomycin (VANCOCIN) IVPB 1000 mg/200 mL premix     1,000 mg 200 mL/hr over 60 Minutes Intravenous 60 min pre-op 05/03/18 1055 05/03/18 1228      Assessment/Plan: s/p Procedure(s): AMPUTATION RIGHT GREAT TOE (Right) Stable overall.  Continue to progress with physical therapy with Darco shoe.  Discharge when comfortable with mobilization   LOS: 2 days   Shannon [name] 05/05/2018, 10:08 AM

## 2018-05-06 DIAGNOSIS — N178 Other acute kidney failure: Secondary | ICD-10-CM

## 2018-05-06 DIAGNOSIS — Z931 Gastrostomy status: Secondary | ICD-10-CM

## 2018-05-06 DIAGNOSIS — Z7901 Long term (current) use of anticoagulants: Secondary | ICD-10-CM

## 2018-05-06 DIAGNOSIS — N183 Chronic kidney disease, stage 3 (moderate): Secondary | ICD-10-CM

## 2018-05-06 DIAGNOSIS — S98111A Complete traumatic amputation of right great toe, initial encounter: Secondary | ICD-10-CM

## 2018-05-06 DIAGNOSIS — M86171 Other acute osteomyelitis, right ankle and foot: Secondary | ICD-10-CM

## 2018-05-06 DIAGNOSIS — I4892 Unspecified atrial flutter: Secondary | ICD-10-CM

## 2018-05-06 DIAGNOSIS — I4891 Unspecified atrial fibrillation: Secondary | ICD-10-CM

## 2018-05-06 DIAGNOSIS — J449 Chronic obstructive pulmonary disease, unspecified: Secondary | ICD-10-CM

## 2018-05-06 DIAGNOSIS — E1142 Type 2 diabetes mellitus with diabetic polyneuropathy: Secondary | ICD-10-CM

## 2018-05-06 LAB — CBC
HCT: 23 % — ABNORMAL LOW (ref 36.0–46.0)
Hemoglobin: 7.8 g/dL — ABNORMAL LOW (ref 12.0–15.0)
MCH: 30.8 pg (ref 26.0–34.0)
MCHC: 33.9 g/dL (ref 30.0–36.0)
MCV: 90.9 fL (ref 80.0–100.0)
Platelets: 223 10*3/uL (ref 150–400)
RBC: 2.53 MIL/uL — ABNORMAL LOW (ref 3.87–5.11)
RDW: 15.2 % (ref 11.5–15.5)
WBC: 8 10*3/uL (ref 4.0–10.5)
nRBC: 0 % (ref 0.0–0.2)

## 2018-05-06 LAB — GLUCOSE, CAPILLARY
Glucose-Capillary: 76 mg/dL (ref 70–99)
Glucose-Capillary: 106 mg/dL — ABNORMAL HIGH (ref 70–99)
Glucose-Capillary: 86 mg/dL (ref 70–99)
Glucose-Capillary: 133 mg/dL — ABNORMAL HIGH (ref 70–99)

## 2018-05-06 LAB — BASIC METABOLIC PANEL
Anion gap: 6 (ref 5–15)
BUN: 35 mg/dL — ABNORMAL HIGH (ref 8–23)
CO2: 23 mmol/L (ref 22–32)
Calcium: 8.9 mg/dL (ref 8.9–10.3)
Chloride: 109 mmol/L (ref 98–111)
Creatinine, Ser: 1.55 mg/dL — ABNORMAL HIGH (ref 0.44–1.00)
GFR calc Af Amer: 33 mL/min — ABNORMAL LOW (ref >60)
GFR calc non Af Amer: 29 mL/min — ABNORMAL LOW (ref >60)
Glucose, Bld: 88 mg/dL (ref 70–99)
Potassium: 4.4 mmol/L (ref 3.5–5.1)
Sodium: 138 mmol/L (ref 135–145)

## 2018-05-06 LAB — PROTIME-INR
INR: 2.31
Prothrombin Time: 25 seconds — ABNORMAL HIGH (ref 11.4–15.2)

## 2018-05-06 MED ORDER — FUROSEMIDE 20 MG PO TABS
20.0000 mg | ORAL_TABLET | Freq: Every day | ORAL | Status: DC
  Administered 2018-05-06 – 2018-05-09 (×4): 20 mg via ORAL
  Filled 2018-05-06 (×4): qty 1

## 2018-05-06 MED ORDER — OSMOLITE 1.2 CAL PO LIQD
1000.0000 mL | ORAL | Status: AC
  Administered 2018-05-06: 1000 mL
  Filled 2018-05-06 (×2): qty 1000

## 2018-05-06 NOTE — Progress Notes (Signed)
[place] for warfarin Indication: atrial fibrillation  Allergies  Allergen Reactions  . Penicillins Itching, Rash and Other (See Comments)    Tolerated amoxicillin, unasyn, zosyn & cephalosporins in the past. Did it involve swelling of the face/tongue/throat, SOB, or low BP? No Did it involve sudden or severe rash/hives, skin peeling, or any reaction on the inside of your mouth or nose? No Did you need to seek medical attention at a hospital or doctor's office? Unknown When did it last happen?5+ years If all above answers are "NO", may proceed with cephalosporin use.     Patient Measurements: Height: 5\' 7"  (170.2 cm) Weight: 156 lb 8.4 oz (71 kg) IBW/kg (Calculated) : 61.6   Vital Signs: Temp: 98.4 F (36.9 C) (02/03 0525) Temp Source: Oral (02/03 0525) BP: 173/62 (02/03 0525) Pulse Rate: 65 (02/03 0525)  Labs: Recent Labs    05/04/18 0333 05/05/18 0446 05/06/18 0322  HGB  --  7.7* 7.8*  HCT  --  23.3* 23.0*  PLT  --  208 223  LABPROT 23.5* 25.0* 25.0*  INR 2.13 2.31 2.31  CREATININE  --  1.45* 1.55*    Estimated Creatinine Clearance: 22.5 mL/min (A) (by C-G formula based on SCr of 1.55 mg/dL (H)).  Assessment: 85 yo female admitted s/p right great toe amputation. Patient on warfarin PTA for atrial fibrillation. Pharmacy consulted to manage warfarin.   INR therapeutic at 2.31. Cont home dose coumadin.  PTA: 5mg  daily except 2.5mg  on Fridays  Goal of Therapy:  INR 2-3 Monitor platelets by anticoagulation protocol: Yes   Plan:  Warfarin 5 mg PO daily except 2.5 mg on Fri Change INR to MWF  Onnie Boer, PharmD, BCIDP, AAHIVP, CPP Infectious Disease Pharmacist 05/06/2018 11:52 AM

## 2018-05-06 NOTE — Care Management (Addendum)
    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  A walking aid will not resolve issue with performing activities of daily living. A wheelchair will allow patient to safely perform daily activities. Patient is not able to propel themselves in the home using a standard weight wheelchair due to weakness. Patient can self propel in the lightweight wheelchair.  Accessories: elevating leg rests, wheel locks, extensions and anti-tippers.   05/05/18 [place] Migdalia Dk PT, DPT Acute Rehabilitation Services Pager [phone] Office [phone]

## 2018-05-06 NOTE — Progress Notes (Signed)
Physical Therapy Treatment Patient Details Name: Shannon [name] MRN: [id number] DOB: 1923/04/10 Today's Date: 05/06/2018    History of Present Illness 85 y.o. female, with past medical history significant for CAD, A. fib on warfarin, history of CVA, hypothyroidism, hypertension, diabetes mellitus, chronic diastolic congestive heart failure and PCM/dysphagia status post PEG tube placement, chronic, admitted for R great toe pain, dx with gangrene and is s/p R GT ampuation 05/04/18     PT Comments    Pt up in chair having already ambulated with mobility tech this morning. Pt agreeable to ambulating again. Pt continues to be limited in safe mobility by decreased balance and pain post surgically. Pt is currently min A for transfers and ambulation of 20 feet. Pt would benefit from increased therapy at SNF however she and her daughter with whom she lives have declined SNF services and plans to go home. In light of their decision PT recommends HHPT and wheelchair to improve safe mobility in her home environment.    Follow Up Recommendations  SNF     Equipment Recommendations  Wheelchair cushion (measurements PT);Wheelchair (measurements PT)       Precautions / Restrictions Precautions Precautions: Fall Precaution Comments: Peg tube Required Braces or Orthoses: Other Brace(R Darco shoe for ambulation  ) Restrictions Weight Bearing Restrictions: Yes RLE Weight Bearing: Weight bearing as tolerated(Darco shoe) Other Position/Activity Restrictions: in Darco shoe    Mobility  Bed Mobility               General bed mobility comments: OOB in recliner on entry   Transfers Overall transfer level: Needs assistance Equipment used: Rolling walker (2 wheeled) Transfers: Sit to/from [employer] Sit to Stand: Min assist         General transfer comment: min A for power up and steadying in RW  Ambulation/Gait Ambulation/Gait assistance: Min assist Gait Distance (Feet): 20  Feet Assistive device: Rolling walker (2 wheeled) Gait Pattern/deviations: Step-through pattern;Decreased step length - left;Decreased stance time - right;Decreased weight shift to right Gait velocity: slowed Gait velocity interpretation: <1.31 ft/sec, indicative of household ambulator General Gait Details: minA for steadying due to unsteadiness of Darco shoe,        Balance Overall balance assessment: Needs assistance Sitting-balance support: Feet supported;No upper extremity supported Sitting balance-Leahy Scale: Fair     Standing balance support: Bilateral upper extremity supported;During functional activity Standing balance-Leahy Scale: Poor Standing balance comment: requires UE support for steadying                            Cognition Arousal/Alertness: Awake/alert Behavior During Therapy: WFL for tasks assessed/performed Overall Cognitive Status: Within Functional Limits for tasks assessed                                           General Comments General comments (skin integrity, edema, etc.): Pt ambulated with Darco shoe on R foot and normal shoe on L foot to provide increased balance       Pertinent Vitals/Pain Pain Assessment: Faces Faces Pain Scale: Hurts a little bit Pain Location: R foot with weightbearing Pain Descriptors / Indicators: Grimacing;Guarding;Operative site guarding Pain Intervention(s): Limited activity within patient's tolerance;Monitored during session;Repositioned    Home Living Family/patient expects to be discharged to:: Private residence Living Arrangements: Children;Other relatives Available Help at Discharge: Family;Available 24 hours/day Type of  Home: House Home Access: Ramped entrance   Home Layout: One level Home Equipment: Walker - 4 wheels;Cane - single point;Hand held Careers information officer - 2 wheels;Bedside commode;Tub bench      Prior Function Level of Independence: Needs assistance  Gait /  Transfers Assistance Needed: Reports she uses rollator for short ambulation distances and did not require assist.  ADL's / Homemaking Assistance Needed: Reports daughter helps transfer into tub, however, pt is able to bathe herself and able to dress herself. Reports she needs assist for IADLs.      PT Goals (current goals can now be found in the care plan section) Acute Rehab PT Goals Patient Stated Goal: go home PT Goal Formulation: With patient Time For Goal Achievement: 05/19/18 Potential to Achieve Goals: Fair Progress towards PT goals: Progressing toward goals    Frequency    Min 5X/week      PT Plan Other (comment)(Pt and family refusing SNF, recommend HHPT)       AM-PAC PT "6 Clicks" Mobility   Outcome Measure  Help needed turning from your back to your side while in a flat bed without using bedrails?: A Little Help needed moving from lying on your back to sitting on the side of a flat bed without using bedrails?: A Lot Help needed moving to and from a bed to a chair (including a wheelchair)?: A Lot Help needed standing up from a chair using your arms (e.g., wheelchair or bedside chair)?: A Lot Help needed to walk in hospital room?: Total Help needed climbing 3-5 steps with a railing? : Total 6 Click Score: 11    End of Session Equipment Utilized During Treatment: Gait belt Activity Tolerance: Patient limited by pain Patient left: in chair;with call bell/phone within reach;with chair alarm set Nurse Communication: Mobility status;Weight bearing status PT Visit Diagnosis: Unsteadiness on feet (R26.81);Other abnormalities of gait and mobility (R26.89);Muscle weakness (generalized) (M62.81);Difficulty in walking, not elsewhere classified (R26.2);Pain Pain - Right/Left: Right Pain - part of body: Ankle and joints of foot     Time: 6578-4696 PT Time Calculation (min) (ACUTE ONLY): 16 min  Charges:  $Gait Training: 8-22 mins                     Daphane Odekirk B. Beverely Risen PT, DPT Acute Rehabilitation Services Pager [phone] Office [phone]    Elon Alas Fleet 05/06/2018, 2:57 PM

## 2018-05-06 NOTE — Care Management Note (Addendum)
Case Management Note  Patient Details  Name: Shannon [name] MRN: [id number] Date of Birth: Dec 27, 1923  Subjective/Objective:                    Action/Plan:Daughter has changed mind on home health agency , she would like [facility] if she can have PT named Mel. Referral given and accepted by [facility] with Alvis Lemmings , Mel will be assigned to patient. Dan with [facility] updated.    Will need PT or MD co sign narrative for wc  :[name] at bedside. Patient and daughter want to go home with home health [facility]. Patient has rolator at home , needs wheelchair. Paged MD for home health orders and face to face .  Patient from home with daughter [name]. Patient consented for NCM to call daughter regarding discharge plan.   Left voice mails on [facility] numbers [phone] [phone]. Awaiting call back.   Left Medicare.gov list of home health agencies at bedside , in case needed.  Expected Discharge Date:  05/04/18               Expected Discharge Plan:     In-House Referral:  Clinical Social Work  Discharge planning Services  CM Consult  Post Acute Care Choice:    Choice offered to:  Patient, Adult Children  DME Arranged:    DME Agency:     HH Arranged:    [place] Agency:     Status of Service:     If discussed at [employer], dates discussed:    Additional Comments:  Marilu Favre, RN 05/06/2018, 12:34 PM

## 2018-05-06 NOTE — Progress Notes (Signed)
CSW met with patient this morning, and patient and daughter this afternoon. Patient said she wanted to go home, that she lives with her daughter and son-in-law who are both there with her all day long. Patient requested that [facility] talk with her daughter. Daughter indicated that she is agreeable with taking the patient home, that they had gone to rehab in the past and it was not a good experience. Daughter said that she and her husband are comfortable providing care for the patient, as they also have a handicapped daughter.  CSW alerted RNCM. CSW signing off.  Laveda Abbe, [place] Clinical Social Worker [phone]

## 2018-05-06 NOTE — Evaluation (Signed)
Occupational Therapy Evaluation Patient Details Name: Shannon [name] MRN: [id number] DOB: October 23, 1923 Today's Date: 05/06/2018    History of Present Illness 85 y.o. female, with past medical history significant for CAD, A. fib on warfarin, history of CVA, hypothyroidism, hypertension, diabetes mellitus, chronic diastolic congestive heart failure and PCM/dysphagia status post PEG tube placement, chronic, admitted for R great toe pain, dx with gangrene and is s/p R GT ampuation 05/04/18    Clinical Impression   This 85 yo female admitted and underwent above presents to acute OT with decreased balance, need to wear Darco shoe when up on her feet, decreased ability to [employer] shoe all affecting her safety and independence with basic ADLs. Pta pt was Mod I with all basic ADLs except needed A in and out of tub with tub bench and occasional A with socks/shoes. Now pt is Mod A-setup level for basic ADLs and needs someone with her anytime she is up on her feet as well as her Darco shoe on RLE and everyday shoe on LLE. She will benefit from acute OT with follow up OT at SNF, if refuses SNF then [facility] and 24 hour S.    Follow Up Recommendations  SNF;Supervision/Assistance - 24 hour;Other (comment)(A any time she is up on her feet and will need to have Darco shoe and walking shoe on other foot anytime she is up on her feet)    Equipment Recommendations  None recommended by OT       Precautions / Restrictions Precautions Precautions: Fall Precaution Comments: Peg tube Required Braces or Orthoses: Other Brace(Right Darco shoe for ambulation) Restrictions Weight Bearing Restrictions: Yes RLE Weight Bearing: Weight bearing as tolerated Other Position/Activity Restrictions: in Darco shoe      Mobility Bed Mobility               General bed mobility comments: Pt up in room with mobility tech when I arrived  Transfers Overall transfer level: Needs assistance Equipment used: Rolling walker (2  wheeled) Transfers: Sit to/from Stand Sit to Stand: Min assist              Balance Overall balance assessment: Needs assistance Sitting-balance support: No upper extremity supported;Feet supported Sitting balance-Leahy Scale: Good     Standing balance support: Bilateral upper extremity supported;During functional activity Standing balance-Leahy Scale: Poor Standing balance comment: requires RW and additional external support                            ADL either performed or assessed with clinical judgement   ADL Overall ADL's : Needs assistance/impaired Eating/Feeding: Independent;Sitting Eating/Feeding Details (indicate cue type and reason): recliner     Upper Body Bathing: Set up;Supervision/ safety;Sitting Upper Body Bathing Details (indicate cue type and reason): recliner Lower Body Bathing: Minimal assistance;Sit to/from stand   Upper Body Dressing : Set up;Supervision/safety;Sitting Upper Body Dressing Details (indicate cue type and reason): recliner Lower Body Dressing: Moderate assistance Lower Body Dressing Details (indicate cue type and reason): min A sit<>stand Toilet Transfer: Minimal assistance;Ambulation;RW;BSC Toilet Transfer Details (indicate cue type and reason): over toilet Toileting- Clothing Manipulation and Hygiene: Moderate assistance Toileting - Clothing Manipulation Details (indicate cue type and reason): min A sit<>stand       General ADL Comments: Pt asking about take shoes off at home, i educated her that she can have shoes off (including Darco shoe) when she is sitting or laying down, but needs it on all the  time when she is standing or ambulating. Pt reports that she feels better walking with her everyday shoe on left foot.     Vision Patient Visual Report: No change from baseline              Pertinent Vitals/Pain Pain Assessment: No/denies pain     Hand Dominance Right   Extremity/Trunk Assessment Upper Extremity  Assessment Upper Extremity Assessment: Overall WFL for tasks assessed RUE Deficits / Details: R shoulder pain and limited ROM due to old rotator cuff issues           Communication Communication Communication: No difficulties   Cognition Arousal/Alertness: Awake/alert Behavior During Therapy: WFL for tasks assessed/performed Overall Cognitive Status: Within Functional Limits for tasks assessed                                                Home Living Family/patient expects to be discharged to:: Private residence Living Arrangements: Children;Other relatives Available Help at Discharge: Family;Available 24 hours/day Type of Home: House Home Access: Ramped entrance     Home Layout: One level     Bathroom Shower/Tub: Tub/shower unit;Curtain   Firefighter: Standard     Home Equipment: Environmental consultant - 4 wheels;Cane - single point;Hand held Careers information officer - 2 wheels;Bedside commode;Tub bench          Prior Functioning/Environment Level of Independence: Needs assistance  Gait / Transfers Assistance Needed: Reports she uses rollator for short ambulation distances and did not require assist.  ADL's / Homemaking Assistance Needed: Reports daughter helps transfer into tub, however, pt is able to bathe herself and able to dress herself. Reports she needs assist for IADLs.             OT Problem List: Decreased strength;Impaired balance (sitting and/or standing)      OT Treatment/Interventions: Self-care/ADL training;Balance training;DME and/or AE instruction;Patient/family education    OT Goals(Current goals can be found in the care plan section) Acute Rehab OT Goals Patient Stated Goal: go home OT Goal Formulation: With patient Time For Goal Achievement: 05/20/18 Potential to Achieve Goals: Good  OT Frequency: Min 2X/week              AM-PAC OT "6 Clicks" Daily Activity     Outcome Measure Help from another person eating meals?: None Help  from another person taking care of personal grooming?: A Little Help from another person toileting, which includes using toliet, bedpan, or urinal?: A Lot Help from another person bathing (including washing, rinsing, drying)?: A Little Help from another person to put on and taking off regular upper body clothing?: A Little Help from another person to put on and taking off regular lower body clothing?: A Lot 6 Click Score: 17   End of Session Equipment Utilized During Treatment: Gait belt;Rolling walker Nurse Communication: (NT: needs purewick placed)  Activity Tolerance: Patient tolerated treatment well Patient left: in chair;with chair alarm set;with call bell/phone within reach  OT Visit Diagnosis: Unsteadiness on feet (R26.81);Other abnormalities of gait and mobility (R26.89);Muscle weakness (generalized) (M62.81)                Time: 1610-9604 OT Time Calculation (min): 50 min Charges:  OT General Charges $OT Visit: 1 Visit OT Evaluation $OT Eval Moderate Complexity: 1 Mod OT Treatments $Self Care/Home Management : 23-37 mins  Ignacia Palma, OTR/L Acute Rehab  Services Pager [phone] Office [phone]     Shannon [name] 05/06/2018, 1:28 PM

## 2018-05-07 ENCOUNTER — Encounter (HOSPITAL_COMMUNITY): Payer: Self-pay | Admitting: Vascular Surgery

## 2018-05-07 LAB — BASIC METABOLIC PANEL
Anion gap: 11 (ref 5–15)
BUN: 37 mg/dL — ABNORMAL HIGH (ref 8–23)
CO2: 21 mmol/L — ABNORMAL LOW (ref 22–32)
Calcium: 8.9 mg/dL (ref 8.9–10.3)
Chloride: 103 mmol/L (ref 98–111)
Creatinine, Ser: 1.3 mg/dL — ABNORMAL HIGH (ref 0.44–1.00)
GFR calc Af Amer: 41 mL/min — ABNORMAL LOW (ref >60)
GFR calc non Af Amer: 36 mL/min — ABNORMAL LOW (ref >60)
Glucose, Bld: 211 mg/dL — ABNORMAL HIGH (ref 70–99)
Potassium: 4.1 mmol/L (ref 3.5–5.1)
Sodium: 135 mmol/L (ref 135–145)

## 2018-05-07 LAB — CBC
HCT: 24.1 % — ABNORMAL LOW (ref 36.0–46.0)
Hemoglobin: 7.9 g/dL — ABNORMAL LOW (ref 12.0–15.0)
MCH: 29.9 pg (ref 26.0–34.0)
MCHC: 32.8 g/dL (ref 30.0–36.0)
MCV: 91.3 fL (ref 80.0–100.0)
Platelets: 235 10*3/uL (ref 150–400)
RBC: 2.64 MIL/uL — ABNORMAL LOW (ref 3.87–5.11)
RDW: 14.9 % (ref 11.5–15.5)
WBC: 7.5 10*3/uL (ref 4.0–10.5)
nRBC: 0 % (ref 0.0–0.2)

## 2018-05-07 LAB — GLUCOSE, CAPILLARY
Glucose-Capillary: 231 mg/dL — ABNORMAL HIGH (ref 70–99)
Glucose-Capillary: 122 mg/dL — ABNORMAL HIGH (ref 70–99)
Glucose-Capillary: 75 mg/dL (ref 70–99)
Glucose-Capillary: 94 mg/dL (ref 70–99)

## 2018-05-07 NOTE — Progress Notes (Signed)
Expand All Collapse All      PROGRESS NOTE  Shannon [name]:[id number] DOB: 06/19/1923 DOA: 05/03/2018 PCP: Sharon Seller, NP  HPI/Recap of past 24 hours: [name] a85 y.o.female,with past medical history significant for CAD, A. fib on warfarin, history of CVA, hypothyroidism, hypertension, diabetes mellitus, chronic diastolic congestive heart failure and PCM/dysphagia status post PEG tube placement, chronic, who was admitted originally today for right big toe amputation.Patient tolerated surgery well and is being admitted today due to complex medical issues mentioned above. Patient denies any chest pains, shortness of breath, nausea vomiting or diarrhea. Patient seen postop. Her main complaint is right foot pain.  05/04/2018: POD #1 post right big toe amputation.  Patient seen and examined at bedside.  No acute events overnight, alert and oriented x3.  Reports minimal pain on her right lower extremity.  She requests a clear liquid diet.  PEG tube in place.  No new complaints.  05/05/2018: Seen and examined at her bedside. No acute events overnight.  She has no new complaints.  Blood pressure is mildly elevated this morning.  She is on 3 antihypertensive medications that are due this morning.  Subjective: Today is doing well, is off of nasal O2 and sats are 100%.  Working w/ PT  Assessment/Plan: Active Problems:   Peripheral vascular disease (HCC)  Gangrene R great toe/ SP toe amputation per [facility] 05/03/18 - per  vascular surgery - family doesn't want SNF, wants home health at home after dc - working w/ PT to get OOB and up and walking some  AKI on CKD 3 Presented with creatinine of 2.18 now >> 1.55 which is baseline  Acute blood loss anemia status post surgery - Baseline hemoglobin appears to be 10 - Hb stable today at 7.9 due to ABL, hemodilution and blood draws - Continue ferrous sulfate 325 mg daily - lab holiday tomorrow  Acute on chronic  hypoxic respiratory failure At baseline she is on 2 L of oxygen by nasal cannula at night at home Currently improved to room air w/ 100% sats  Dysphagia / PEG tube feeds - She eats only full liquid diet at home and here - gets PEG feeds overnight x 12 hrs each night (Osmolite 12 hrs overnight) - irrigate G tube tid  Hypertension: BP's stable - cont home meds coreg 6.25 bid/ hydralazine 50 tid - wt's were up, restarted home furosemide 20 qd  Hypothyroidism Continue Synthroid  Type 2 diabetes Last hemoglobin A1c 6.6 on 04/17/2018 Blood glucose is well controlled Continue sliding scale  COPD Continue with nebulizer treatment  A. Fib,rate controlled continue with Coreg Continue with Coumadin Therapeutic INR  CAD; stable Continue with beta-blocker and Imdur  Acute pain - continue with OxyIR and as needed morphine - continue with Neurontin  Diastolic congestive heart failure last echo in 05/2016 showing ejection fraction of 60 to 65% - resumed home lasix 20 mg daily    DVT Prophylaxis:Coumadin Code Status:full Disposition Plan:home w HH when ready per [facility] Status: Inpatient, medical tele  Vinson Moselle MD [facility] pgr [phone] 05/06/2018, 2:48 PM   Objective:       Vitals:   05/04/18 1508 05/04/18 2038 05/04/18 2135 05/05/18 0609  BP: (!) 146/53  131/68 (!) 163/50  Pulse: 68  67 71  Resp: 14  16 16   Temp: 99.7 F (37.6 C)  99 F (37.2 C) 99.2 F (37.3 C)  TempSrc: Oral  Oral Oral  SpO2: 100% 99% 97% 97%  Weight:      Height:        Intake/Output Summary (Last 24 hours) at 05/05/2018 1005 Last data filed at 05/05/2018 8657    Gross per 24 hour  Intake 210 ml  Output 1500 ml  Net -1290 ml      Filed Weights   05/03/18 1104  Weight: 65.8 kg    Exam:   General: 85 y.o. year-old female well-developed well-nourished in no acute distress.  Alert and oriented x3.    Cardiovascular: Regular rate  and rhythm with no rubs or gallops.  No JVD or thyromegaly noted.    Respiratory: Clear to auscultation with no wheezes or rales.  Good inspiratory effort.  Abdomen: Soft nontender nondistended with normal bowel sounds x4 quadrants.  Musculoskeletal: Trace lower extremity edema.  Right foot up to ankle in surgical dressing.  Psychiatry: Mood is appropriate for condition and setting   Data Reviewed: CBC: LastLabs      Recent Labs  Lab 05/03/18 1055 05/05/18 0446  WBC 8.5 7.7  HGB 8.8* 7.7*  HCT 27.6* 23.3*  MCV 93.2 92.5  PLT 230 208     Basic Metabolic Panel: LastLabs      Recent Labs  Lab 05/03/18 1055 05/05/18 0446  NA 142 138  K 4.5 4.6  CL 104 108  CO2 25 23  GLUCOSE 114* 205*  BUN 84* 41*  CREATININE 2.18* 1.45*  CALCIUM 9.6 8.8*     GFR: Estimated Creatinine Clearance: 24.1 mL/min (A) (by C-G formula based on SCr of 1.45 mg/dL (H)). Liver Function Tests: LastLabs     Recent Labs  Lab 05/03/18 1055  AST 42*  ALT 34  ALKPHOS 59  BILITOT 1.0  PROT 7.6  ALBUMIN 3.1*     LastLabs  No results for input(s): LIPASE, AMYLASE in the last 168 hours.   LastLabs  No results for input(s): AMMONIA in the last 168 hours.   Coagulation Profile: LastLabs       Recent Labs  Lab 05/03/18 1055 05/04/18 0333 05/05/18 0446  INR 2.08 2.13 2.31     Cardiac Enzymes: LastLabs  No results for input(s): CKTOTAL, CKMB, CKMBINDEX, TROPONINI in the last 168 hours.   BNP (last 3 results) RecentLabs(withinlast365days)  No results for input(s): PROBNP in the last 8760 hours.   HbA1C: RecentLabs(last2labs)  No results for input(s): HGBA1C in the last 72 hours.   CBG: LastLabs         Recent Labs  Lab 05/04/18 0749 05/04/18 1153 05/04/18 1711 05/04/18 2132 05/05/18 0758  GLUCAP 169* 85 102* 125* 223*     Lipid Profile: RecentLabs(last2labs)  No results for input(s): CHOL, HDL, LDLCALC, TRIG, CHOLHDL,  LDLDIRECT in the last 72 hours.   Thyroid Function Tests: RecentLabs(last2labs)  No results for input(s): TSH, T4TOTAL, FREET4, T3FREE, THYROIDAB in the last 72 hours.   Anemia Panel: RecentLabs(last2labs)  No results for input(s): VITAMINB12, FOLATE, FERRITIN, TIBC, IRON, RETICCTPCT in the last 72 hours.   Urine analysis: Labs(Brief)          Component Value Date/Time   COLORURINE YELLOW 11/06/2017 1448   APPEARANCEUR CLEAR 11/06/2017 1448   LABSPEC 1.013 11/06/2017 1448   PHURINE 7.5 11/06/2017 1448   GLUCOSEU NEGATIVE 11/06/2017 1448   HGBUR NEGATIVE 11/06/2017 1448   BILIRUBINUR Neg 11/01/2017 1228   KETONESUR NEGATIVE 11/06/2017 1448   PROTEINUR NEGATIVE 11/06/2017 1448   UROBILINOGEN 0.2 11/01/2017 1228   UROBILINOGEN 0.2 01/27/2015 2308  NITRITE NEGATIVE 11/06/2017 1448   LEUKOCYTESUR 2+ (A) 11/06/2017 1448     Sepsis Labs: @LABRCNTIP (procalcitonin:4,lacticidven:4)  ) Recent Results (from the past 240 hour(s))  WOUND CULTURE     Status: None   Collection Time: 05/01/18  3:00 PM  Result Value Ref Range Status   MICRO NUMBER: [id number]  Final   SPECIMEN QUALITY: Adequate  Final   SOURCE: NOT GIVEN  Final   STATUS: FINAL  Final   GRAM STAIN: No organisms or white blood cells seen  Final   RESULT:   Final    A mix of non-predominating organisms of questionable significance was recovered on culture and not further identified. (Note: Growth did not detected the presence of S.aureus, beta-hemolytic Streptococci or P.aeruginosa).  Surgical pcr screen     Status: None   Collection Time: 05/03/18 11:28 AM  Result Value Ref Range Status   MRSA, PCR NEGATIVE NEGATIVE Final   Staphylococcus aureus NEGATIVE NEGATIVE Final    Comment: (NOTE) The Xpert SA Assay (FDA approved for NASAL specimens in patients 66 years of age and older), is one component of a comprehensive surveillance program. It is not intended to diagnose  infection nor to guide or monitor treatment. Performed at [facility], [address], [place], [place] [address]       Studies: No results found.  Scheduled Meds: . artificial tears   Both Eyes QHS  . atorvastatin  10 mg Oral Daily  . calcium-vitamin D  1 tablet Oral Q breakfast  . carvedilol  6.25 mg Oral BID  . chlorhexidine  60 mL Topical Once   And  . chlorhexidine  60 mL Topical Once  . cycloSPORINE  1 drop Both Eyes BID  . feeding supplement (PRO-STAT SUGAR FREE 64)  30 mL Oral BID  . ferrous sulfate  325 mg Oral Daily  . gabapentin  300 mg Oral QHS  . hydrALAZINE  50 mg Oral TID  . insulin aspart  0-5 Units Subcutaneous QHS  . insulin aspart  0-9 Units Subcutaneous TID WC  . insulin glargine  5 Units Subcutaneous QHS  . isosorbide mononitrate  60 mg Oral Daily  . latanoprost  1 drop Both Eyes QHS  . levothyroxine  125 mcg Oral Q0600  . mometasone-formoterol  2 puff Inhalation BID  . multivitamin with minerals  1 tablet Oral Daily  . pantoprazole  40 mg Oral BID  . polyvinyl alcohol  1 drop Both Eyes QID  . ENSURE MAX PROTEIN  11 oz Oral Daily  . Warfarin - Pharmacist Dosing Inpatient   Does not apply q1800    Continuous Infusions: . sodium chloride    . lactated ringers 10 mL/hr at 05/03/18 1128     LOS: 2 days    If 7PM-7AM, please contact night-coverage [email or web address] Password [facility] 05/05/2018, 10:05 AM      Revision History

## 2018-05-07 NOTE — Care Management Important Message (Signed)
Important Message  Patient Details  Name: Shannon [name] MRN: [id number] Date of Birth: Dec 29, 1923   Medicare Important Message Given:  Yes    [name] Montine Circle 05/07/2018, 8:41 AM

## 2018-05-07 NOTE — Progress Notes (Signed)
Shannon [name] for warfarin Indication: atrial fibrillation  Allergies  Allergen Reactions  . Penicillins Itching, Rash and Other (See Comments)    Tolerated amoxicillin, unasyn, zosyn & cephalosporins in the past. Did it involve swelling of the face/tongue/throat, SOB, or low BP? No Did it involve sudden or severe rash/hives, skin peeling, or any reaction on the inside of your mouth or nose? No Did you need to seek medical attention at a hospital or doctor's office? Unknown When did it last happen?5+ years If all above answers are "NO", may proceed with cephalosporin use.     Patient Measurements: Height: 5\' 7"  (170.2 cm) Weight: 155 lb 13.8 oz (70.7 kg) IBW/kg (Calculated) : 61.6   Vital Signs: Temp: 98.4 F (36.9 C) (02/04 0920) Temp Source: Oral (02/04 0920) BP: 180/63 (02/04 0920) Pulse Rate: 63 (02/04 0920)  Labs: Recent Labs    05/05/18 0446 05/06/18 0322 05/07/18 0259  HGB 7.7* 7.8* 7.9*  HCT 23.3* 23.0* 24.1*  PLT 208 223 235  LABPROT 25.0* 25.0*  --   INR 2.31 2.31  --   CREATININE 1.45* 1.55* 1.30*    Estimated Creatinine Clearance: 26.9 mL/min (A) (by C-G formula based on SCr of 1.3 mg/dL (H)).  Assessment: 85 yo female admitted s/p right great toe amputation. Warfarin for Afib, hx DVTINR 2.31, Hgb low but stable at 7.9, pltc 235  PTA: 5mg  daily except 2.5mg  on Fridays  Goal of Therapy:  INR 2-3 Monitor platelets by anticoagulation protocol: Yes   Plan:  Coumadin 5mg  qday except 2.5mg  Fri Continue INR to MWF  Shannon [name], PharmD, BCPS, [facility] Clinical Pharmacist Phone number [phone] 05/07/2018 9:54 AM

## 2018-05-07 NOTE — Progress Notes (Signed)
Speech Language Pathology Treatment: Dysphagia  Patient Details Name: Shannon [name] MRN: [id number] DOB: 06/11/1923 Today's Date: 05/07/2018 Time: 1517-6160 SLP Time Calculation (min) (ACUTE ONLY): 13 min  Assessment / Plan / Recommendation Clinical Impression  Pt and her daughter deny any acute changes to her dysphagia and she has had no changes in respiratory status since evaluation. She continues to decline repeat testing and diet modifications, acknowledging the risk of aspiration given silent aspiration on most recent MBS. Pt recalled education given during evaluation, particularly related to risks and the importance of oral care, with Mod I. She and her daughter politely decline additional SLP f/u at this time. Please reorder if there are any acute changes.   HPI HPI: Shannon [name] is a 85 y.o. female nonhealing right toe ulcers.  Had been followed by Dr. Logan Bores with podiatry.  The family has requested that we proceed with amputation.  She had had similar issues in her left foot with eventual healing after revascularization.  She has a history of multiple medical problems.   She is on chronic O2 at home at 2 liters/minute and is on a total thin liquid diet.  There is no recent chest or head imaging.        SLP Plan  All goals met       Recommendations  Diet recommendations: Thin liquid(CLD and TF) Medication Administration: Via alternative means                Oral Care Recommendations: Oral care QID Follow up Recommendations: None SLP Visit Diagnosis: Dysphagia, unspecified (R13.10) Plan: All goals met       GO                Virl Axe Shannon [name] 05/07/2018, 2:15 PM  Natalia Leatherwood, M.A. CCC-SLP Acute Herbalist [phone] Office [phone]

## 2018-05-07 NOTE — Progress Notes (Signed)
Patient ID: Shannon [name], female   DOB: 1923-08-25, 85 y.o.   MRN: [id number] Dressing removed from foot.  Right great transmetatarsal amputation healing.  2-3+ popliteal pulse.

## 2018-05-07 NOTE — Care Management Important Message (Signed)
Important Message  Patient Details  Name: Shannon [name] MRN: [id number] Date of Birth: 1923-08-29   Medicare Important Message Given:  Yes Due to illness patient was not able to sign.   Keayra Graham 05/07/2018, 8:44 AM

## 2018-05-07 NOTE — Progress Notes (Signed)
Expand All Collapse All      PROGRESS NOTE  Shannon [name] DGL:[id number] DOB: 10-Nov-1923 DOA: 05/03/2018 PCP: Sharon Seller, NP  HPI/Recap of past 24 hours: [name] 85 y.o.female,with past medical history significant for CAD, A. fib on warfarin, history of CVA, hypothyroidism, hypertension, diabetes mellitus, chronic diastolic congestive heart failure and PCM/dysphagia status post PEG tube placement, chronic, who was admitted originally today for right big toe amputation.Patient tolerated surgery well and is being admitted today due to complex medical issues mentioned above. Patient denies any chest pains, shortness of breath, nausea vomiting or diarrhea. Patient seen postop. Her main complaint is right foot pain.  05/04/2018: POD #1 post right big toe amputation.  Patient seen and examined at bedside.  No acute events overnight, alert and oriented x3.  Reports minimal pain on her right lower extremity.  She requests a clear liquid diet.  PEG tube in place.  No new complaints.  05/05/2018: Seen and examined at her bedside. No acute events overnight.  She has no new complaints.  Blood pressure is mildly elevated this morning.  She is on 3 antihypertensive medications that are due this morning.  Subjective: Today is feeling better, PT is working w/ her and they got up and OOB for the first time.    Assessment/Plan: Active Problems:   Peripheral vascular disease (HCC)  Gangrene R great toe/ SP toe amputation per [facility] 05/03/18 - per  vascular surgery ok for dc if mobilization ok - family doesn't want SNF, wants home health and dc home - got up w/ PT first time today, may be ready for dc in 1- 2 days depending on progress  AKI on CKD 3 Presented with creatinine of 2.18 now >> 1.55 which is baseline  Acute blood loss anemia status post surgery - Baseline hemoglobin appears to be 10 - Hb down here 7.8 prob from hemodilution and blood draws - Continue  ferrous sulfate 325 mg daily - recheck CBC in am  Acute on chronic hypoxic respiratory failure At baseline she is on 2 L of oxygen by nasal cannula at night at home Currently on 2 L saturating 97% Maintain O2 saturation greater than 92%  Dysphagia / PEG tube feeds - She eats only full liquid diet at home and here - gets PEG feeds overnight x 12 hrs each night, will order Osmolite 12 hrs overnight as at home - flush tube tid  Hypertension: BP's up a bit - cont home meds coreg 6.25 bid/ hydralazine 50 tid - restart home furosemide 20 qd  Hypothyroidism Continue Synthroid  Type 2 diabetes Last hemoglobin A1c 6.6 on 04/17/2018 Blood glucose is well controlled Continue sliding scale  COPD Continue with nebulizer treatment  A. Fib,rate controlled continue with Coreg Continue with Coumadin Therapeutic INR  CAD; stable Continue with beta-blocker and Imdur  Acute pain - continue with OxyIR and as needed morphine - continue with Neurontin  Diastolic congestive heart failure last echo in 05/2016 showing ejection fraction of 60 to 65% - resume lasix 20 mg daily today and dc any fluids (wt's are up but on exam not overloaded)    DVT Prophylaxis:Coumadin Code Status:full Disposition Plan:home w HH in 1 to 2 days  Status: Inpatient, medical tele  Vinson Moselle MD [facility] pgr [phone] 05/06/2018, 2:48 PM   Objective:       Vitals:   05/04/18 1508 05/04/18 2038 05/04/18 2135 05/05/18 0609  BP: (!) 146/53  131/68 (!) 163/50  Pulse: 68  67 71  Resp: 14  16 16   Temp: 99.7 F (37.6 C)  99 F (37.2 C) 99.2 F (37.3 C)  TempSrc: Oral  Oral Oral  SpO2: 100% 99% 97% 97%  Weight:      Height:        Intake/Output Summary (Last 24 hours) at 05/05/2018 1005 Last data filed at 05/05/2018 9323    Gross per 24 hour  Intake 210 ml  Output 1500 ml  Net -1290 ml      Filed Weights   05/03/18 1104  Weight: 65.8 kg     Exam:   General: 85 y.o. year-old female well-developed well-nourished in no acute distress.  Alert and oriented x3.    Cardiovascular: Regular rate and rhythm with no rubs or gallops.  No JVD or thyromegaly noted.    Respiratory: Clear to auscultation with no wheezes or rales.  Good inspiratory effort.  Abdomen: Soft nontender nondistended with normal bowel sounds x4 quadrants.  Musculoskeletal: Trace lower extremity edema.  Right foot up to ankle in surgical dressing.  Psychiatry: Mood is appropriate for condition and setting   Data Reviewed: CBC: LastLabs      Recent Labs  Lab 05/03/18 1055 05/05/18 0446  WBC 8.5 7.7  HGB 8.8* 7.7*  HCT 27.6* 23.3*  MCV 93.2 92.5  PLT 230 208     Basic Metabolic Panel: LastLabs      Recent Labs  Lab 05/03/18 1055 05/05/18 0446  NA 142 138  K 4.5 4.6  CL 104 108  CO2 25 23  GLUCOSE 114* 205*  BUN 84* 41*  CREATININE 2.18* 1.45*  CALCIUM 9.6 8.8*     GFR: Estimated Creatinine Clearance: 24.1 mL/min (A) (by C-G formula based on SCr of 1.45 mg/dL (H)). Liver Function Tests: LastLabs     Recent Labs  Lab 05/03/18 1055  AST 42*  ALT 34  ALKPHOS 59  BILITOT 1.0  PROT 7.6  ALBUMIN 3.1*     LastLabs  No results for input(s): LIPASE, AMYLASE in the last 168 hours.   LastLabs  No results for input(s): AMMONIA in the last 168 hours.   Coagulation Profile: LastLabs       Recent Labs  Lab 05/03/18 1055 05/04/18 0333 05/05/18 0446  INR 2.08 2.13 2.31     Cardiac Enzymes: LastLabs  No results for input(s): CKTOTAL, CKMB, CKMBINDEX, TROPONINI in the last 168 hours.   BNP (last 3 results) RecentLabs(withinlast365days)  No results for input(s): PROBNP in the last 8760 hours.   HbA1C: RecentLabs(last2labs)  No results for input(s): HGBA1C in the last 72 hours.   CBG: LastLabs         Recent Labs  Lab 05/04/18 0749 05/04/18 1153 05/04/18 1711 05/04/18 2132  05/05/18 0758  GLUCAP 169* 85 102* 125* 223*     Lipid Profile: RecentLabs(last2labs)  No results for input(s): CHOL, HDL, LDLCALC, TRIG, CHOLHDL, LDLDIRECT in the last 72 hours.   Thyroid Function Tests: RecentLabs(last2labs)  No results for input(s): TSH, T4TOTAL, FREET4, T3FREE, THYROIDAB in the last 72 hours.   Anemia Panel: RecentLabs(last2labs)  No results for input(s): VITAMINB12, FOLATE, FERRITIN, TIBC, IRON, RETICCTPCT in the last 72 hours.   Urine analysis: Labs(Brief)          Component Value Date/Time   COLORURINE YELLOW 11/06/2017 1448   APPEARANCEUR CLEAR 11/06/2017 1448   LABSPEC 1.013 11/06/2017 1448   PHURINE 7.5 11/06/2017 1448   GLUCOSEU NEGATIVE 11/06/2017 1448  HGBUR NEGATIVE 11/06/2017 1448   BILIRUBINUR Neg 11/01/2017 1228   KETONESUR NEGATIVE 11/06/2017 1448   PROTEINUR NEGATIVE 11/06/2017 1448   UROBILINOGEN 0.2 11/01/2017 1228   UROBILINOGEN 0.2 01/27/2015 2308   NITRITE NEGATIVE 11/06/2017 1448   LEUKOCYTESUR 2+ (A) 11/06/2017 1448     Sepsis Labs: @LABRCNTIP (procalcitonin:4,lacticidven:4)  ) Recent Results (from the past 240 hour(s))  WOUND CULTURE     Status: None   Collection Time: 05/01/18  3:00 PM  Result Value Ref Range Status   MICRO NUMBER: [id number]  Final   SPECIMEN QUALITY: Adequate  Final   SOURCE: NOT GIVEN  Final   STATUS: FINAL  Final   GRAM STAIN: No organisms or white blood cells seen  Final   RESULT:   Final    A mix of non-predominating organisms of questionable significance was recovered on culture and not further identified. (Note: Growth did not detected the presence of S.aureus, beta-hemolytic Streptococci or P.aeruginosa).  Surgical pcr screen     Status: None   Collection Time: 05/03/18 11:28 AM  Result Value Ref Range Status   MRSA, PCR NEGATIVE NEGATIVE Final   Staphylococcus aureus NEGATIVE NEGATIVE Final    Comment: (NOTE) The Xpert SA Assay (FDA  approved for NASAL specimens in patients 39 years of age and older), is one component of a comprehensive surveillance program. It is not intended to diagnose infection nor to guide or monitor treatment. Performed at [facility], [address], [place], [place] [address]       Studies: No results found.  Scheduled Meds: . artificial tears   Both Eyes QHS  . atorvastatin  10 mg Oral Daily  . calcium-vitamin D  1 tablet Oral Q breakfast  . carvedilol  6.25 mg Oral BID  . chlorhexidine  60 mL Topical Once   And  . chlorhexidine  60 mL Topical Once  . cycloSPORINE  1 drop Both Eyes BID  . feeding supplement (PRO-STAT SUGAR FREE 64)  30 mL Oral BID  . ferrous sulfate  325 mg Oral Daily  . gabapentin  300 mg Oral QHS  . hydrALAZINE  50 mg Oral TID  . insulin aspart  0-5 Units Subcutaneous QHS  . insulin aspart  0-9 Units Subcutaneous TID WC  . insulin glargine  5 Units Subcutaneous QHS  . isosorbide mononitrate  60 mg Oral Daily  . latanoprost  1 drop Both Eyes QHS  . levothyroxine  125 mcg Oral Q0600  . mometasone-formoterol  2 puff Inhalation BID  . multivitamin with minerals  1 tablet Oral Daily  . pantoprazole  40 mg Oral BID  . polyvinyl alcohol  1 drop Both Eyes QID  . ENSURE MAX PROTEIN  11 oz Oral Daily  . Warfarin - Pharmacist Dosing Inpatient   Does not apply q1800    Continuous Infusions: . sodium chloride    . lactated ringers 10 mL/hr at 05/03/18 1128     LOS: 2 days    If 7PM-7AM, please contact night-coverage [email or web address] Password [facility] 05/05/2018, 10:05 AM      Revision History

## 2018-05-08 ENCOUNTER — Ambulatory Visit: Payer: Medicare Other | Admitting: Podiatry

## 2018-05-08 LAB — GLUCOSE, CAPILLARY
Glucose-Capillary: 95 mg/dL (ref 70–99)
Glucose-Capillary: 123 mg/dL — ABNORMAL HIGH (ref 70–99)
Glucose-Capillary: 83 mg/dL (ref 70–99)
Glucose-Capillary: 84 mg/dL (ref 70–99)

## 2018-05-08 LAB — PROTIME-INR
INR: 2.1
Prothrombin Time: 23.3 seconds — ABNORMAL HIGH (ref 11.4–15.2)

## 2018-05-08 MED ORDER — OSMOLITE 1.2 CAL PO LIQD
1000.0000 mL | ORAL | Status: DC
  Administered 2018-05-08: 1000 mL
  Filled 2018-05-08 (×3): qty 1000

## 2018-05-08 MED ORDER — JEVITY 1.2 CAL PO LIQD
1000.0000 mL | ORAL | Status: DC
  Filled 2018-05-08: qty 1000

## 2018-05-08 MED ORDER — NEPRO/CARBSTEADY PO LIQD
237.0000 mL | ORAL | Status: DC
  Administered 2018-05-09: 237 mL via ORAL
  Filled 2018-05-08: qty 237

## 2018-05-08 MED ORDER — NYSTATIN 100000 UNIT/GM EX POWD
Freq: Two times a day (BID) | CUTANEOUS | Status: DC
  Administered 2018-05-08 – 2018-05-09 (×3): via TOPICAL
  Filled 2018-05-08: qty 15

## 2018-05-08 NOTE — Progress Notes (Addendum)
Nutrition Follow-up  DOCUMENTATION CODES:   Not applicable  INTERVENTION:   -Initiate Osmolite 1.2 @ 65 ml/hr via j-tube over 12 hour period (2100-0900)  Tube feeding regimen provides 939 kcals kcal (63% of needs), 43 grams of protein, and 640 ml of H2O.   -D/c Ensure Max -Nepro Shake po daily, each supplement provides 425 kcal and 19 grams protein -Continue 30 ml Prostat BID, each supplement provides 100 kcals and 15 grams protein -Continue 15 ml liquid MVI daily  NUTRITION DIAGNOSIS:   Inadequate oral intake related to altered GI function(Pyloric stenosis) as evidenced by (Apparent inability to meet needs w/ oral inake alone and a need for PEG-J tube and TF to supplement oral diet).  Ongoing  GOAL:   Patient will meet greater than or equal to 90% of their needs  Progressing  MONITOR:   PO intake, Supplement acceptance, Diet advancement, Labs, I & O's  REASON FOR ASSESSMENT:   Consult Enteral/tube feeding initiation and management  ASSESSMENT:   85 y/o female PMHx CHF, CKD3, CVA, DM2, HLD/HTN, PVD, CAD, COPD, Depression. Pt has been dealing w/ nonhealing right toe ulcers refractory to conservative tx. Presented for amputation of R great toe.   1/31- s/p PROCEDURE: Right great toe amputation  Spoke with pt at bedside, who is in good spirits today. She was consuming soup at time of visit. Pt shares that she typically eats very little (mainly small bites and sips at meals). She has been taking supplements well; she remembers taking Prostat, but not Premier Protein. Pt requesting Nepro supplement, as this is a supplement that is familiar to her at home.   Reviewed orders; noted that TF orders were d/c on 05/07/18. Pt reports that she has been tolerating TF well and confirms home regimen of nocturnal feedings of Osmolite 1.2 @ 65 ml/hr x 12 hours.   Case discussed with RN. MD consulted RD to re-start TF.   Noted likely d/c home tomorrow with home health services.   Labs  reviewed: CBGS: 75-123 (inpatient orders for glycemic control are 0-5 units insulin aspart q HS and 0-9 units insulin aspart TID with meals, and 5 units insulin aspart q HS).  Diet Order:   Diet Order            Diet full liquid Room service appropriate? Yes; Fluid consistency: Thin  Diet effective now              EDUCATION NEEDS:   Education needs have been addressed  Skin:  Skin Assessment: Skin Integrity Issues: Skin Integrity Issues:: Other (Comment) Other: Surgical incision to R foot (s/p R great toe amputation)   Last BM:  05/08/18  Height:   Ht Readings from Last 1 Encounters:  05/03/18 5\' 7"  (1.702 m)    Weight:   Wt Readings from Last 1 Encounters:  05/08/18 69.2 kg    Ideal Body Weight:  61.36 kg  BMI:  Body mass index is 23.89 kg/m.  Estimated Nutritional Needs:   Kcal:  1500-1700 kcals (23-26 kcal/kg bw)  Protein:  75-85g Pro (20% energy needs)  Fluid:  >1.6 L (25 ml/kg bw)    Sergio Hobart A. Jimmye Norman, RD, LDN, CDE Pager: [phone] After hours Pager: [phone]

## 2018-05-08 NOTE — Progress Notes (Signed)
Occupational Therapy Treatment Patient Details Name: Shannon [name] MRN: [id number] DOB: September 02, 1923 Today's Date: 05/08/2018    History of present illness 85 y.o. female, with past medical history significant for CAD, A. fib on warfarin, history of CVA, hypothyroidism, hypertension, diabetes mellitus, chronic diastolic congestive heart failure and PCM/dysphagia status post PEG tube placement, chronic, admitted for R great toe pain, dx with gangrene and is s/p R GT ampuation 05/04/18    OT comments  Pt is a 85 yo female s/p above dx. Pt seen for ADL management and pt with increased ability to perform UB ADL with set-up to supervisionA and ModA for LB ADL due to pain and discomfort from feeding tube. Pt ambulating 125' in hall with RW. Pt fair balance with ambulating and standing at sink. Pt would greatly benefit from continued OT skilled services for ADL, mobility and safety in SNF setting.    Follow Up Recommendations  SNF;Supervision/Assistance - 24 hour    Equipment Recommendations  None recommended by OT    Recommendations for Other Services      Precautions / Restrictions Precautions Precautions: Fall Precaution Comments: Peg tube Required Braces or Orthoses: Other Brace(darco shoe) Restrictions Weight Bearing Restrictions: Yes RLE Weight Bearing: Weight bearing as tolerated Other Position/Activity Restrictions: in Darco shoe       Mobility Bed Mobility Overal bed mobility: Needs Assistance Bed Mobility: Supine to Sit     Supine to sit: Min guard;HOB elevated        Transfers Overall transfer level: Needs assistance Equipment used: Rolling walker (2 wheeled) Transfers: Sit to/from [employer] Sit to Stand: Min assist Stand pivot transfers: Min assist            Balance Overall balance assessment: Needs assistance Sitting-balance support: Feet supported;No upper extremity supported Sitting balance-Leahy Scale: Fair       Standing  balance-Leahy Scale: Fair Standing balance comment: able to statically stand and adjust gown with out UE support                           ADL either performed or assessed with clinical judgement   ADL Overall ADL's : Needs assistance/impaired     Grooming: Wash/dry hands;Wash/dry face;Oral care;Min guard;Standing           Upper Body Dressing : Set up;Supervision/safety;Sitting   Lower Body Dressing: Moderate assistance;Sit to/from stand;Sitting/lateral leans   Toilet Transfer: Min guard;Ambulation;RW;BSC   Toileting- Clothing Manipulation and Hygiene: Minimal assistance;Sit to/from stand;Sitting/lateral lean       Functional mobility during ADLs: Min guard;Rolling walker General ADL Comments: Pt tolerating darco shoe for mobility task. Pt set-up for UB ADL and minA for LB ADL.     Vision   Vision Assessment?: No apparent visual deficits   Perception     Praxis      Cognition Arousal/Alertness: Awake/alert Behavior During Therapy: WFL for tasks assessed/performed Overall Cognitive Status: Within Functional Limits for tasks assessed                                 General Comments: followed all commands        Exercises     Shoulder Instructions       General Comments      Pertinent Vitals/ Pain       Pain Assessment: No/denies pain Faces Pain Scale: No hurt Pain Descriptors / Indicators: Discomfort  Home  Living                                          Prior Functioning/Environment              Frequency  Min 2X/week        Progress Toward Goals  OT Goals(current goals can now be found in the care plan section)  Progress towards OT goals: Progressing toward goals  Acute Rehab OT Goals Patient Stated Goal: go home OT Goal Formulation: With patient Time For Goal Achievement: 05/20/18 Potential to Achieve Goals: Good ADL Goals Pt Will Perform Grooming: with set-up;with  supervision;standing Pt Will Perform Lower Body Dressing: with set-up;with supervision;sit to/from stand Pt Will Transfer to Toilet: with supervision;ambulating;bedside commode Pt Will Perform Toileting - Clothing Manipulation and hygiene: with supervision;sit to/from stand Additional ADL Goal #1: pt will be Mod I in and OOB for basic ADLs (not rail, HOB partially flat--pt uses a wedge at home, increased time)  Plan Discharge plan remains appropriate    Co-evaluation                 AM-PAC OT "6 Clicks" Daily Activity     Outcome Measure   Help from another person eating meals?: None Help from another person taking care of personal grooming?: A Little Help from another person toileting, which includes using toliet, bedpan, or urinal?: A Little Help from another person bathing (including washing, rinsing, drying)?: A Little Help from another person to put on and taking off regular upper body clothing?: A Little Help from another person to put on and taking off regular lower body clothing?: A Lot 6 Click Score: 18    End of Session Equipment Utilized During Treatment: Gait belt;Rolling walker  OT Visit Diagnosis: Unsteadiness on feet (R26.81);Other abnormalities of gait and mobility (R26.89);Muscle weakness (generalized) (M62.81)   Activity Tolerance Patient tolerated treatment well   Patient Left in chair;with chair alarm set;with call bell/phone within reach   Nurse Communication Mobility status        Time: 1127-1204 OT Time Calculation (min): 37 min  Charges: OT General Charges $OT Visit: 1 Visit OT Treatments $Self Care/Home Management : 8-22 mins $Neuromuscular Re-education: 8-22 mins   Cristi Loron) Glendell Docker OTR/L Acute Rehabilitation Services Pager: [phone] Office: [phone]   Sandrea Hughs 05/08/2018, 1:42 PM

## 2018-05-08 NOTE — Progress Notes (Signed)
Pt is leaking around the J/G tube, Paged Dr. Broadus John to advised.  The Pt and her daughter is concern about this. Also the Pt has not had a BM in a couple of days, and would like something for this.

## 2018-05-08 NOTE — Progress Notes (Signed)
[facility] for warfarin Indication: atrial fibrillation  Allergies  Allergen Reactions  . Penicillins Itching, Rash and Other (See Comments)    Tolerated amoxicillin, unasyn, zosyn & cephalosporins in the past. Did it involve swelling of the face/tongue/throat, SOB, or low BP? No Did it involve sudden or severe rash/hives, skin peeling, or any reaction on the inside of your mouth or nose? No Did you need to seek medical attention at a hospital or doctor's office? Unknown When did it last happen?5+ years If all above answers are "NO", may proceed with cephalosporin use.     Patient Measurements: Height: 5\' 7"  (170.2 cm) Weight: 152 lb 8.9 oz (69.2 kg) IBW/kg (Calculated) : 61.6   Vital Signs: Temp: 98.2 F (36.8 C) (02/05 0541) Temp Source: Oral (02/05 0541) BP: 154/75 (02/05 0541) Pulse Rate: 64 (02/05 0541)  Labs: Recent Labs    05/06/18 0322 05/07/18 0259 05/08/18 0243  HGB 7.8* 7.9*  --   HCT 23.0* 24.1*  --   PLT 223 235  --   LABPROT 25.0*  --  23.3*  INR 2.31  --  2.10  CREATININE 1.55* 1.30*  --     Estimated Creatinine Clearance: 26.9 mL/min (A) (by C-G formula based on SCr of 1.3 mg/dL (H)).  Assessment: 85 yo female admitted s/p right great toe amputation. Warfarin for Afib, hx DVT. INR 2.1, Hgb low but stable at 7.9, pltc 235  PTA: 5mg  daily except 2.5mg  on Fridays  Goal of Therapy:  INR 2-3 Monitor platelets by anticoagulation protocol: Yes   Plan:  Coumadin 5mg  qday except 2.5mg  Fri Continue INR to MWF  [name], PharmD, BCPS, [facility] Clinical Pharmacist Phone number [phone] 05/08/2018 8:51 AM

## 2018-05-08 NOTE — Progress Notes (Signed)
PROGRESS NOTE    Shannon [name]  ZHY:[id number] DOB: 1924-01-09 DOA: 05/03/2018 PCP: Sharon Seller, NP  Brief Narrative: Shannon [name] a85 y.o.female,with past medical history significant for CAD, A. fib on warfarin, history of CVA, hypothyroidism, hypertension, diabetes mellitus, chronic diastolic congestive heart failure and PCM/dysphagia status post PEG tube placement, chronic, who was admitted originally for right big toe amputation.Patient tolerated surgery well and was admitted due to complex medical issues mentioned above.   Assessment & Plan:  GangreneRgreat toe/ SP toe amputation per [facility] 05/03/18 - pervascular surgery - family doesn't want SNF, wants home health at home after dc - working w/ PT to get OOB and up and walking some  -Wound care  AKI on CKD 3 -Creatinine on admission was 2.18  -Improved with hydration to 1.5 range which is her baseline   Acute blood loss anemia status post surgery -Baseline hemoglobin appears to be 10 -Hemoglobin yesterday was 7.9 due to acute blood loss, hemodilution etc.  -No overt bleeding, continue ferrous sulfate, check CBC in a.m  Acute on chronic hypoxic respiratory failure At baseline she is on 2 L of oxygen by nasal cannula at nightat home Currently improved to room air w/ 100% sats  Dysphagia/ PEG tube feeds - She eats only full liquid diet at home and here -Tube feeds overnight at baseline will ask dietitian to consult resume tube feeds here -mild drainage from around PEG tube site, will monitor  Hypertension: BP's stable - cont home meds coreg 6.25 bid/ hydralazine 50 tid -Continue furosemide  Hypothyroidism Continue Synthroid  Type 2 diabetes Last hemoglobin A1c 6.6 on 04/17/2018 Blood glucose is well controlled Continue sliding scale  COPD Continue with nebulizer treatment  A. Fib,rate controlled continue with Coreg Continue with Coumadin Therapeutic INR  CAD; stable Continue with  beta-blocker and Imdur  Acute pain - continue with OxyIR and as needed morphine -continue with Neurontin  Diastolic congestive heart failure last echo in 05/2016 showing ejection fraction of 60 to 65% - resumed home lasix 20 mg daily   DVT Prophylaxis:Coumadin Code Status:full Disposition Plan: Home with home health services tomorrow if stable    Consultants:   Vascular surgery   Procedures: Right great toe amputation  Antimicrobials:    Subjective: -Feels okay, mild drainage around PEG tube site, has not started tube feeds yet -Tolerating liquid diet  Objective: Vitals:   05/08/18 0550 05/08/18 0849 05/08/18 0931 05/08/18 1322  BP:   (!) 176/53 (!) 150/66  Pulse:    (!) 58  Resp:    17  Temp:    (!) 97.5 F (36.4 C)  TempSrc:    Oral  SpO2: 96% 95%  99%  Weight:      Height:        Intake/Output Summary (Last 24 hours) at 05/08/2018 1426 Last data filed at 05/08/2018 0800 Gross per 24 hour  Intake 500.58 ml  Output 500 ml  Net 0.58 ml   Filed Weights   05/06/18 0651 05/07/18 0500 05/08/18 0500  Weight: 71 kg 70.7 kg 69.2 kg    Examination:  General exam: Appears calm and comfortable, no distress Respiratory system: Clear to auscultation. Respiratory effort normal. Cardiovascular system: S1 & S2 heard, RRR. No JVD, murmurs, rubs, gallops Gastrointestinal system: Abdomen is nondistended, soft and nontender.Normal bowel sounds heard, PEG tube with minimal drainage Central nervous system: Alert and oriented. No focal neurological deficits. Extremities: No edema, right great toe with dressing Skin: No rashes, lesions or ulcers Psychiatry: Judgement  and insight appear normal. Mood & affect appropriate.     Data Reviewed:   CBC: Recent Labs  Lab 05/03/18 1055 05/05/18 0446 05/06/18 0322 05/07/18 0259  WBC 8.5 7.7 8.0 7.5  HGB 8.8* 7.7* 7.8* 7.9*  HCT 27.6* 23.3* 23.0* 24.1*  MCV 93.2 92.5 90.9 91.3  PLT 230 208 223 235   Basic Metabolic  Panel: Recent Labs  Lab 05/03/18 1055 05/05/18 0446 05/06/18 0322 05/07/18 0259  NA 142 138 138 135  K 4.5 4.6 4.4 4.1  CL 104 108 109 103  CO2 25 23 23  21*  GLUCOSE 114* 205* 88 211*  BUN 84* 41* 35* 37*  CREATININE 2.18* 1.45* 1.55* 1.30*  CALCIUM 9.6 8.8* 8.9 8.9   GFR: Estimated Creatinine Clearance: 26.9 mL/min (A) (by C-G formula based on SCr of 1.3 mg/dL (H)). Liver Function Tests: Recent Labs  Lab 05/03/18 1055  AST 42*  ALT 34  ALKPHOS 59  BILITOT 1.0  PROT 7.6  ALBUMIN 3.1*   No results for input(s): LIPASE, AMYLASE in the last 168 hours. No results for input(s): AMMONIA in the last 168 hours. Coagulation Profile: Recent Labs  Lab 05/03/18 1055 05/04/18 0333 05/05/18 0446 05/06/18 0322 05/08/18 0243  INR 2.08 2.13 2.31 2.31 2.10   Cardiac Enzymes: No results for input(s): CKTOTAL, CKMB, CKMBINDEX, TROPONINI in the last 168 hours. BNP (last 3 results) No results for input(s): PROBNP in the last 8760 hours. HbA1C: No results for input(s): HGBA1C in the last 72 hours. CBG: Recent Labs  Lab 05/07/18 1225 05/07/18 1718 05/07/18 2047 05/08/18 0756 05/08/18 1155  GLUCAP 122* 75 94 95 123*   Lipid Profile: No results for input(s): CHOL, HDL, LDLCALC, TRIG, CHOLHDL, LDLDIRECT in the last 72 hours. Thyroid Function Tests: No results for input(s): TSH, T4TOTAL, FREET4, T3FREE, THYROIDAB in the last 72 hours. Anemia Panel: No results for input(s): VITAMINB12, FOLATE, FERRITIN, TIBC, IRON, RETICCTPCT in the last 72 hours. Urine analysis:    Component Value Date/Time   COLORURINE YELLOW 11/06/2017 1448   APPEARANCEUR CLEAR 11/06/2017 1448   LABSPEC 1.013 11/06/2017 1448   PHURINE 7.5 11/06/2017 1448   GLUCOSEU NEGATIVE 11/06/2017 1448   HGBUR NEGATIVE 11/06/2017 1448   BILIRUBINUR Neg 11/01/2017 1228   KETONESUR NEGATIVE 11/06/2017 1448   PROTEINUR NEGATIVE 11/06/2017 1448   UROBILINOGEN 0.2 11/01/2017 1228   UROBILINOGEN 0.2 01/27/2015 2308    NITRITE NEGATIVE 11/06/2017 1448   LEUKOCYTESUR 2+ (A) 11/06/2017 1448   Sepsis Labs: @LABRCNTIP (procalcitonin:4,lacticidven:4)  ) Recent Results (from the past 240 hour(s))  WOUND CULTURE     Status: None   Collection Time: 05/01/18  3:00 PM  Result Value Ref Range Status   MICRO NUMBER: [id number]  Final   SPECIMEN QUALITY: Adequate  Final   SOURCE: NOT GIVEN  Final   STATUS: FINAL  Final   GRAM STAIN: No organisms or white blood cells seen  Final   RESULT:   Final    A mix of non-predominating organisms of questionable significance was recovered on culture and not further identified. (Note: Growth did not detected the presence of S.aureus, beta-hemolytic Streptococci or P.aeruginosa).  Surgical pcr screen     Status: None   Collection Time: 05/03/18 11:28 AM  Result Value Ref Range Status   MRSA, PCR NEGATIVE NEGATIVE Final   Staphylococcus aureus NEGATIVE NEGATIVE Final    Comment: (NOTE) The Xpert SA Assay (FDA approved for NASAL specimens in patients 81 years of age and older), is one component of  a comprehensive surveillance program. It is not intended to diagnose infection nor to guide or monitor treatment. Performed at [facility], [address], [place], [place] [address]          Radiology Studies: No results found.      Scheduled Meds: . artificial tears   Both Eyes QHS  . atorvastatin  10 mg Oral Daily  . calcium-vitamin D  1 tablet Oral Q breakfast  . carvedilol  6.25 mg Oral BID  . cycloSPORINE  1 drop Both Eyes BID  . feeding supplement (PRO-STAT SUGAR FREE 64)  30 mL Oral BID  . ferrous sulfate  325 mg Oral Daily  . furosemide  20 mg Oral Daily  . gabapentin  300 mg Oral QHS  . hydrALAZINE  50 mg Oral TID  . insulin aspart  0-5 Units Subcutaneous QHS  . insulin aspart  0-9 Units Subcutaneous TID WC  . insulin glargine  5 Units Subcutaneous QHS  . isosorbide mononitrate  60 mg Oral Daily  . latanoprost  1 drop Both Eyes QHS  .  levothyroxine  125 mcg Oral Q0600  . mometasone-formoterol  2 puff Inhalation BID  . multivitamin with minerals  1 tablet Oral Daily  . nystatin   Topical BID  . pantoprazole  40 mg Oral BID  . polyvinyl alcohol  1 drop Both Eyes QID  . ENSURE MAX PROTEIN  11 oz Oral Daily  . warfarin  5 mg Oral Once per day on Sun Mon Tue Wed Thu Sat   And  . [START ON 05/10/2018] warfarin  2.5 mg Oral Q Fri-1800  . Warfarin - Pharmacist Dosing Inpatient   Does not apply q1800   Continuous Infusions: . sodium chloride       LOS: 5 days    Time spent:    Zannie Cove, MD [facility]  05/08/2018, 2:26 PM

## 2018-05-09 ENCOUNTER — Telehealth: Payer: Self-pay | Admitting: Vascular Surgery

## 2018-05-09 LAB — BASIC METABOLIC PANEL
Anion gap: 12 (ref 5–15)
BUN: 40 mg/dL — ABNORMAL HIGH (ref 8–23)
CO2: 22 mmol/L (ref 22–32)
Calcium: 9 mg/dL (ref 8.9–10.3)
Chloride: 102 mmol/L (ref 98–111)
Creatinine, Ser: 1.28 mg/dL — ABNORMAL HIGH (ref 0.44–1.00)
GFR calc Af Amer: 42 mL/min — ABNORMAL LOW (ref >60)
GFR calc non Af Amer: 36 mL/min — ABNORMAL LOW (ref >60)
Glucose, Bld: 208 mg/dL — ABNORMAL HIGH (ref 70–99)
Potassium: 3.9 mmol/L (ref 3.5–5.1)
Sodium: 136 mmol/L (ref 135–145)

## 2018-05-09 LAB — GLUCOSE, CAPILLARY
Glucose-Capillary: 196 mg/dL — ABNORMAL HIGH (ref 70–99)
Glucose-Capillary: 203 mg/dL — ABNORMAL HIGH (ref 70–99)
Glucose-Capillary: 123 mg/dL — ABNORMAL HIGH (ref 70–99)
Glucose-Capillary: 113 mg/dL — ABNORMAL HIGH (ref 70–99)

## 2018-05-09 LAB — CBC
HCT: 25.1 % — ABNORMAL LOW (ref 36.0–46.0)
Hemoglobin: 8.5 g/dL — ABNORMAL LOW (ref 12.0–15.0)
MCH: 30.6 pg (ref 26.0–34.0)
MCHC: 33.9 g/dL (ref 30.0–36.0)
MCV: 90.3 fL (ref 80.0–100.0)
Platelets: 288 10*3/uL (ref 150–400)
RBC: 2.78 MIL/uL — ABNORMAL LOW (ref 3.87–5.11)
RDW: 14.9 % (ref 11.5–15.5)
WBC: 8 10*3/uL (ref 4.0–10.5)
nRBC: 0 % (ref 0.0–0.2)

## 2018-05-09 MED ORDER — GABAPENTIN 300 MG PO CAPS: 300.0000 mg | ORAL_CAPSULE | Freq: Two times a day (BID) | ORAL | Status: DC

## 2018-05-09 MED ORDER — INSULIN ASPART 100 UNIT/ML ~~LOC~~ SOLN
0.0000 [IU] | SUBCUTANEOUS | Status: DC
  Administered 2018-05-09: 5 [IU] via SUBCUTANEOUS
  Administered 2018-05-09: 2 [IU] via SUBCUTANEOUS

## 2018-05-09 NOTE — Progress Notes (Signed)
[facility] for warfarin Indication: atrial fibrillation  Allergies  Allergen Reactions  . Penicillins Itching, Rash and Other (See Comments)    Tolerated amoxicillin, unasyn, zosyn & cephalosporins in the past. Did it involve swelling of the face/tongue/throat, SOB, or low BP? No Did it involve sudden or severe rash/hives, skin peeling, or any reaction on the inside of your mouth or nose? No Did you need to seek medical attention at a hospital or doctor's office? Unknown When did it last happen?5+ years If all above answers are "NO", may proceed with cephalosporin use.     Patient Measurements: Height: 5\' 7"  (170.2 cm) Weight: 152 lb 8.9 oz (69.2 kg) IBW/kg (Calculated) : 61.6   Vital Signs: Temp: 98.5 F (36.9 C) (02/06 0436) Temp Source: Oral (02/06 0436) BP: 148/60 (02/06 0436) Pulse Rate: 62 (02/06 0436)  Labs: Recent Labs    05/07/18 0259 05/08/18 0243 05/09/18 0311  HGB 7.9*  --  8.5*  HCT 24.1*  --  25.1*  PLT 235  --  288  LABPROT  --  23.3*  --   INR  --  2.10  --   CREATININE 1.30*  --  1.28*    Estimated Creatinine Clearance: 27.3 mL/min (A) (by C-G formula based on SCr of 1.28 mg/dL (H)).  Assessment: 85 yo female admitted s/p right great toe amputation. Warfarin for Afib, hx DVT. INR 2.1, Hgb low but stable at 8.5, pltc 288  PTA: 5mg  daily except 2.5mg  on Fridays  Goal of Therapy:  INR 2-3 Monitor platelets by anticoagulation protocol: Yes   Plan:  Coumadin 5mg  qday except 2.5mg  Fri Continue INR to MWF  [name], PharmD, BCPS, [facility] Clinical Pharmacist Phone number [phone] 05/09/2018 8:26 AM

## 2018-05-09 NOTE — Progress Notes (Signed)
Pt's daughter is her guardianship

## 2018-05-09 NOTE — Telephone Encounter (Signed)
-----   Message from Gabriel Earing, [place] sent at 05/07/2018  8:02 AM EST ----- Right great toe amp.  F/u with Dr. Donnetta Hutching in 3 weeks.  Thanks

## 2018-05-09 NOTE — Discharge Summary (Signed)
Physician Discharge Summary  Shannon [name]:[id number] DOB: [birth year] DOA: 05/03/2018  PCP: Sharon Seller, NP  Admit date: 05/03/2018 Discharge date: 05/09/2018  Time spent: 35 minutes  Recommendations for Outpatient Follow-up:  PCP in 1 week, please consider Goals of Care discussions with pt and family at [facility] Dr.Early in 2weeks  Discharge Diagnoses:  Principal Problem:   Amputated great toe of right foot (HCC) Active Problems:   Essential hypertension, benign   Chronic kidney insufficiency, stage 3 (moderate) (HCC)   Diabetic polyneuropathy associated with type 2 diabetes mellitus (HCC)   Acute osteomyelitis of toe of right foot (HCC)   Atrial fibrillation and flutter (HCC)   Warfarin anticoagulation   PEG (percutaneous endoscopic gastrostomy) status (HCC)   COPD with asthma (HCC)   Acute renal failure superimposed on stage 3 chronic kidney disease (HCC)   PAD (peripheral artery disease) (HCC)   Peripheral vascular disease (HCC)   Discharge Condition: sTable  Diet recommendation: Liquid diet during the daytime and tube feeds/Osmolite for 12 hours at night  Filed Weights   05/06/18 0651 05/07/18 0500 05/08/18 0500  Weight: 71 kg 70.7 kg 69.2 kg    History of present illness:  85 y.o.female,with past medical history significant for CAD, A. fib on warfarin, history of CVA, hypothyroidism, hypertension, diabetes mellitus, chronic diastolic congestive heart failure and PCM/dysphagia status post PEG tube placement, chronic, who was admitted originally for right big toe amputation byvascular surgery  Hospital Course:  GangreneRgreat toe/ SP toe amputation per [facility] 05/03/18 - pervascular surgery - family doesn't want SNF, wants home healthat home after dc -working w/ PT to get OOB and up and walking some  -Using Darco shoe, stable for discharge, patient and family declined SNF for rehab she will be discharged home with home health services -Outpatient  follow-up with Dr. early in 2 to 3 weeks  AKI on CKD 3 -Creatinine on admission was 2.18  -Improved with hydration to 1.5 range which is her baseline   Acute blood loss anemia status post surgery -Baseline hemoglobin appears to be 10 -Hemoglobin yesterday was 7.9 due to acute blood loss, hemodilution etc.  -No overt bleeding, continue ferrous sulfate, check CBC in a.m  Acute on chronic hypoxic respiratory failure At baseline she is on 2 L of oxygen by nasal cannula at nightat home Currentlyimproved to room air w/ 100% sats  Dysphagia/ PEG tube feeds - She eats only full liquid diet at home and gets tube feeds overnight -Tube feeds resumed postop, she has been noticed to have mild drainage around her PEG tube site off and on, PEG tube was exchanged few weeks ago in January I suspect the skin and soft tissue still has to heal and approximate around the tube and hence anticipate ongoing mild drainage for several weeks, at this time do not suspect secondary infection, no erythema, no fevers or leukocytosis, needs to be monitored by PCP at follow-up as well. -Discussed with the daughter to avoid steroid cream which she had been using as needed, advised to keep the area dry and clean as possible  Hypertension: BP'sstable - cont home meds coreg 6.25 bid/ hydralazine 50 tid -Continue furosemide  Hypothyroidism Continue Synthroid  Type 2 diabetes Last hemoglobin A1c 6.6 on 04/17/2018 Blood glucose is well controlled Continue sliding scale  COPD Continue with nebulizer treatment  A. Fib,rate controlled continue with Coreg Continue with Coumadin Therapeutic INR  CAD; stable Continue with beta-blocker and Imdur  Acute pain - continue with OxyIR  and as needed morphine -continue with Neurontin  Diastolic congestive heart failure last echo in 05/2016 showing ejection fraction of 60 to 65% - resumed homelasix 20 mg daily   Discharge Exam: Vitals:   05/08/18  2100 05/09/18 0436  BP:  (!) 148/60  Pulse:  62  Resp:  18  Temp:  98.5 F (36.9 C)  SpO2: 98% 99%    General: AAOx2 Cardiovascular: S1S2/RRR Respiratory: CTAB  Discharge Instructions   Discharge Instructions    Increase activity slowly   Complete by:  As directed      Allergies as of 05/09/2018      Reactions   Penicillins Itching, Rash, Other (See Comments)   Tolerated amoxicillin, unasyn, zosyn & cephalosporins in the past. Did it involve swelling of the face/tongue/throat, SOB, or low BP? No Did it involve sudden or severe rash/hives, skin peeling, or any reaction on the inside of your mouth or nose? No Did you need to seek medical attention at a hospital or doctor's office? Unknown When did it last happen?5+ years If all above answers are "NO", may proceed with cephalosporin use.      Medication List    STOP taking these medications   doxycycline 100 MG tablet Commonly known as:  VIBRA-TABS   sulfamethoxazole-trimethoprim 800-160 MG tablet Commonly known as:  BACTRIM DS,SEPTRA DS     TAKE these medications   acetaminophen 500 MG tablet Commonly known as:  TYLENOL Take 500 mg by mouth every 6 (six) hours as needed (pain).   ARTIFICIAL TEARS OP Place 1 drop into both eyes 4 (four) times daily.   atorvastatin 10 MG tablet Commonly known as:  LIPITOR TAKE 1 TABLET BY MOUTH EVERY DAY   b complex vitamins tablet Take 1 tablet by mouth daily.   calcium-vitamin D 500-200 MG-UNIT tablet Commonly known as:  OSCAL WITH D Take 1 tablet by mouth daily with breakfast.   carvedilol 6.25 MG tablet Commonly known as:  COREG Take 1 tablet (6.25 mg total) by mouth 2 (two) times daily.   collagenase ointment Commonly known as:  SANTYL Apply 1 application topically daily. What changed:    when to take this  reasons to take this   cycloSPORINE 0.05 % ophthalmic emulsion Commonly known as:  RESTASIS USE 1 DROP INTO BOTH EYES TWICE DAILY   feeding  supplement (PRO-STAT SUGAR FREE 64) Liqd Take 30 mLs by mouth 2 (two) times daily.   feeding supplement Liqd Take 711 mLs by mouth See admin instructions. For 12 hours 2100 - 0900   ferrous sulfate 325 (65 FE) MG tablet TAKE ONE TABLET BY MOUTH ONCE DAILY WITH BREAKFAST What changed:  See the new instructions.   fluticasone 50 MCG/ACT nasal spray Commonly known as:  FLONASE Spray 2 sprays into each nostril once daily What changed:    how much to take  how to take this  when to take this  additional instructions   free water Soln Place 120 mLs into feeding tube 3 (three) times daily.   furosemide 40 MG tablet Commonly known as:  LASIX TAKE 1 TABLET BY MOUTH EVERY DAY   gabapentin 300 MG capsule Commonly known as:  NEURONTIN Take 1 capsule (300 mg total) by mouth 2 (two) times daily. What changed:    how much to take  when to take this   gentamicin cream 0.1 % Commonly known as:  GARAMYCIN Apply 1 application topically 3 (three) times daily.   hydrALAZINE 50 MG tablet Commonly known  as:  APRESOLINE Take 50 mg by mouth 3 (three) times daily.   insulin aspart 100 UNIT/ML FlexPen Commonly known as:  NOVOLOG FLEXPEN Take three times daily before each meal based on sliding scale 140-199( 2 units), 200-250 (4 units), 251-299 (6 units), 300-349 (8 units), > 350(10 units)   Insulin Glargine 100 UNIT/ML Solostar Pen Commonly known as:  LANTUS SOLOSTAR INJECT 10 UNITS DAILY TO CONTROL BLOOD SUGAR What changed:    how much to take  how to take this  when to take this  additional instructions   Insulin Pen Needle 31G X 5 MM Misc Commonly known as:  B-D UF III MINI PEN NEEDLES Use twice daily with giving insulin injections. Dx E11.9   ipratropium-albuterol 0.5-2.5 (3) MG/3ML Soln Commonly known as:  DUONEB Take 3 mLs by nebulization every 6 (six) hours as needed (sob). Use 3 times daily x 4 days then every 6 hours as needed. What changed:  additional  instructions   isosorbide mononitrate 30 MG 24 hr tablet Commonly known as:  IMDUR TAKE 2 TABLETS (60 MG TOTAL) BY MOUTH DAILY.   latanoprost 0.005 % ophthalmic solution Commonly known as:  XALATAN Place 1 drop into both eyes at bedtime.   levalbuterol 45 MCG/ACT inhaler Commonly known as:  XOPENEX HFA Inhale 2 puffs into the lungs every 6 (six) hours as needed for wheezing or shortness of breath.   levothyroxine 125 MCG tablet Commonly known as:  SYNTHROID, LEVOTHROID Take 1 tablet (125 mcg total) by mouth daily.   loratadine 10 MG tablet Commonly known as:  CLARITIN Take 10 mg by mouth daily as needed for allergies.   mometasone-formoterol 200-5 MCG/ACT Aero Commonly known as:  DULERA Take two puffs by mouth twice daily   multivitamin with minerals Tabs tablet Take 1 tablet by mouth daily.   nystatin powder Commonly known as:  MYCOSTATIN/NYSTOP Apply topically 3 (three) times daily. What changed:    how much to take  when to take this  reasons to take this   oxyCODONE 5 MG immediate release tablet Commonly known as:  ROXICODONE Take 1 tablet (5 mg total) by mouth every 4 (four) hours as needed for severe pain. May take 1/2 tab q4hrs as needed for mild pain. What changed:    how much to take  when to take this  additional instructions   OXYGEN Inhale 2 L into the lungs at bedtime.   pantoprazole 40 MG tablet Commonly known as:  PROTONIX TAKE 1 TABLET BY MOUTH TWICE A DAY   sertraline 100 MG tablet Commonly known as:  ZOLOFT TAKE 1 TABLET BY MOUTH EVERY DAY FOR DEPRESSION What changed:    how much to take  how to take this  when to take this  additional instructions   SYSTANE NIGHTTIME Oint Place 1 application into both eyes at bedtime.   warfarin 2.5 MG tablet Commonly known as:  COUMADIN Take as directed. If you are unsure how to take this medication, talk to your nurse or doctor. Original instructions:  TAKE AS DIRECTED BY COUMADIN  CLINIC. What changed:  See the new instructions.            Durable Medical Equipment  (From admission, onward)         Start     Ordered   05/05/18 1652  For home use only DME wheelchair cushion (seat and back)  Once     05/05/18 1651   05/05/18 1649  For home use only DME  lightweight manual wheelchair with seat cushion  Once    Comments:  Patient suffers from recent amputation which impairs their ability to perform daily activities like ambulating in the home.  A walking aid will not resolve issue with performing activities of daily living. A wheelchair will allow patient to safely perform daily activities. Patient is not able to propel themselves in the home using a standard weight wheelchair due to weakness. Patient can self propel in the lightweight wheelchair.  Accessories: elevating leg rests, wheel locks, extensions and anti-tippers.   05/05/18 1651         Allergies  Allergen Reactions  . Penicillins Itching, Rash and Other (See Comments)    Tolerated amoxicillin, unasyn, zosyn & cephalosporins in the past. Did it involve swelling of the face/tongue/throat, SOB, or low BP? No Did it involve sudden or severe rash/hives, skin peeling, or any reaction on the inside of your mouth or nose? No Did you need to seek medical attention at a hospital or doctor's office? Unknown When did it last happen?5+ years If all above answers are "NO", may proceed with cephalosporin use.    Follow-up Information    [facility] Follow up.   Specialty:  Home Health Services Contact information: [address] [place] [address] [phone]        Larina Earthly, MD Follow up in 2 week(s).   Specialties:  Vascular Surgery, Cardiology Why:  Office will call you to arrange your appt (sent) Contact information: [address] [place] [address] [phone]        Sharon Seller, NP. Schedule an appointment as soon as possible for a visit in 1  week(s).   Specialty:  Geriatric Medicine Contact information: [address] [place] [address] [phone]            The results of significant diagnostics from this hospitalization (including imaging, microbiology, ancillary and laboratory) are listed below for reference.    Significant Diagnostic Studies: Dg Foot Complete Right  Result Date: 05/01/2018 Please see detailed radiograph report in office note.   Microbiology: Recent Results (from the past 240 hour(s))  WOUND CULTURE     Status: None   Collection Time: 05/01/18  3:00 PM  Result Value Ref Range Status   MICRO NUMBER: [id number]  Final   SPECIMEN QUALITY: Adequate  Final   SOURCE: NOT GIVEN  Final   STATUS: FINAL  Final   GRAM STAIN: No organisms or white blood cells seen  Final   RESULT:   Final    A mix of non-predominating organisms of questionable significance was recovered on culture and not further identified. (Note: Growth did not detected the presence of S.aureus, beta-hemolytic Streptococci or P.aeruginosa).  Surgical pcr screen     Status: None   Collection Time: 05/03/18 11:28 AM  Result Value Ref Range Status   MRSA, PCR NEGATIVE NEGATIVE Final   Staphylococcus aureus NEGATIVE NEGATIVE Final    Comment: (NOTE) The Xpert SA Assay (FDA approved for NASAL specimens in patients 86 years of age and older), is one component of a comprehensive surveillance program. It is not intended to diagnose infection nor to guide or monitor treatment. Performed at [facility], [address], [place], [place] [address]      Labs: Basic Metabolic Panel: Recent Labs  Lab 05/03/18 1055 05/05/18 0446 05/06/18 0322 05/07/18 0259 05/09/18 0311  NA 142 138 138 135 136  K 4.5 4.6 4.4 4.1  3.9  CL 104 108 109 103 102  CO2 25 23 23  21* 22  GLUCOSE 114* 205* 88 211* 208*  BUN 84* 41* 35* 37* 40*  CREATININE 2.18* 1.45* 1.55* 1.30* 1.28*  CALCIUM 9.6 8.8* 8.9 8.9 9.0   Liver Function  Tests: Recent Labs  Lab 05/03/18 1055  AST 42*  ALT 34  ALKPHOS 59  BILITOT 1.0  PROT 7.6  ALBUMIN 3.1*   No results for input(s): LIPASE, AMYLASE in the last 168 hours. No results for input(s): AMMONIA in the last 168 hours. CBC: Recent Labs  Lab 05/03/18 1055 05/05/18 0446 05/06/18 0322 05/07/18 0259 05/09/18 0311  WBC 8.5 7.7 8.0 7.5 8.0  HGB 8.8* 7.7* 7.8* 7.9* 8.5*  HCT 27.6* 23.3* 23.0* 24.1* 25.1*  MCV 93.2 92.5 90.9 91.3 90.3  PLT 230 208 223 235 288   Cardiac Enzymes: No results for input(s): CKTOTAL, CKMB, CKMBINDEX, TROPONINI in the last 168 hours. BNP: BNP (last 3 results) Recent Labs    06/06/17 1754  BNP 260.5*    ProBNP (last 3 results) No results for input(s): PROBNP in the last 8760 hours.  CBG: Recent Labs  Lab 05/08/18 1707 05/08/18 2054 05/09/18 0151 05/09/18 0438 05/09/18 0811  GLUCAP 83 84 196* 203* 123*       Signed:  Zannie Cove MD.  [facility] 05/09/2018, 11:13 AM

## 2018-05-09 NOTE — Telephone Encounter (Signed)
sch appt lvm mld ltr 05/28/2018 3pm p/o MD

## 2018-05-10 ENCOUNTER — Other Ambulatory Visit: Payer: Self-pay

## 2018-05-10 ENCOUNTER — Telehealth: Payer: Self-pay | Admitting: *Deleted

## 2018-05-10 DIAGNOSIS — K922 Gastrointestinal hemorrhage, unspecified: Secondary | ICD-10-CM

## 2018-05-10 MED ORDER — FERROUS SULFATE 325 (65 FE) MG PO TABS: 325.0000 mg | ORAL_TABLET | Freq: Every day | ORAL | 1 refills | Status: DC

## 2018-05-10 NOTE — Telephone Encounter (Signed)
I have made the 1st attempt to contact the patient or family member in charge, in order to follow up from recently being discharged from the hospital. No Answer but I will make another attempt at a different time.

## 2018-05-11 DIAGNOSIS — J449 Chronic obstructive pulmonary disease, unspecified: Secondary | ICD-10-CM | POA: Diagnosis not present

## 2018-05-11 DIAGNOSIS — J9621 Acute and chronic respiratory failure with hypoxia: Secondary | ICD-10-CM | POA: Diagnosis not present

## 2018-05-11 DIAGNOSIS — F329 Major depressive disorder, single episode, unspecified: Secondary | ICD-10-CM | POA: Diagnosis not present

## 2018-05-11 DIAGNOSIS — D62 Acute posthemorrhagic anemia: Secondary | ICD-10-CM | POA: Diagnosis not present

## 2018-05-11 DIAGNOSIS — E1122 Type 2 diabetes mellitus with diabetic chronic kidney disease: Secondary | ICD-10-CM

## 2018-05-11 DIAGNOSIS — Z4781 Encounter for orthopedic aftercare following surgical amputation: Secondary | ICD-10-CM | POA: Diagnosis not present

## 2018-05-11 DIAGNOSIS — E43 Unspecified severe protein-calorie malnutrition: Secondary | ICD-10-CM | POA: Diagnosis not present

## 2018-05-11 DIAGNOSIS — E1143 Type 2 diabetes mellitus with diabetic autonomic (poly)neuropathy: Secondary | ICD-10-CM | POA: Diagnosis not present

## 2018-05-11 DIAGNOSIS — M86171 Other acute osteomyelitis, right ankle and foot: Secondary | ICD-10-CM | POA: Diagnosis not present

## 2018-05-11 DIAGNOSIS — I2510 Atherosclerotic heart disease of native coronary artery without angina pectoris: Secondary | ICD-10-CM | POA: Diagnosis not present

## 2018-05-11 DIAGNOSIS — I4892 Unspecified atrial flutter: Secondary | ICD-10-CM | POA: Diagnosis not present

## 2018-05-11 DIAGNOSIS — K311 Adult hypertrophic pyloric stenosis: Secondary | ICD-10-CM | POA: Diagnosis not present

## 2018-05-11 DIAGNOSIS — D631 Anemia in chronic kidney disease: Secondary | ICD-10-CM | POA: Diagnosis not present

## 2018-05-11 DIAGNOSIS — E039 Hypothyroidism, unspecified: Secondary | ICD-10-CM | POA: Diagnosis not present

## 2018-05-11 DIAGNOSIS — E1142 Type 2 diabetes mellitus with diabetic polyneuropathy: Secondary | ICD-10-CM | POA: Diagnosis not present

## 2018-05-11 DIAGNOSIS — N179 Acute kidney failure, unspecified: Secondary | ICD-10-CM | POA: Diagnosis not present

## 2018-05-11 DIAGNOSIS — I6529 Occlusion and stenosis of unspecified carotid artery: Secondary | ICD-10-CM | POA: Diagnosis not present

## 2018-05-11 DIAGNOSIS — K3184 Gastroparesis: Secondary | ICD-10-CM | POA: Diagnosis not present

## 2018-05-11 DIAGNOSIS — I4891 Unspecified atrial fibrillation: Secondary | ICD-10-CM | POA: Diagnosis not present

## 2018-05-11 DIAGNOSIS — R1310 Dysphagia, unspecified: Secondary | ICD-10-CM | POA: Diagnosis not present

## 2018-05-11 DIAGNOSIS — E1152 Type 2 diabetes mellitus with diabetic peripheral angiopathy with gangrene: Secondary | ICD-10-CM | POA: Diagnosis not present

## 2018-05-11 DIAGNOSIS — N183 Chronic kidney disease, stage 3 (moderate): Secondary | ICD-10-CM | POA: Diagnosis not present

## 2018-05-11 DIAGNOSIS — I5032 Chronic diastolic (congestive) heart failure: Secondary | ICD-10-CM

## 2018-05-11 DIAGNOSIS — I130 Hypertensive heart and chronic kidney disease with heart failure and stage 1 through stage 4 chronic kidney disease, or unspecified chronic kidney disease: Secondary | ICD-10-CM | POA: Diagnosis not present

## 2018-05-11 DIAGNOSIS — D6862 Lupus anticoagulant syndrome: Secondary | ICD-10-CM | POA: Diagnosis not present

## 2018-05-12 NOTE — Progress Notes (Signed)
HPI: 85 year old female patient presents the office for follow-up treatment evaluation regarding a chronic ulcer to the right hallux.  Patient has a longstanding history of peripheral vascular disease and she has been treated conservatively here in the office for the past several months to the ulcer to the right hallux which is been stable with modest improvement over the past several months.  We have been treating her conservatively with wound care dressings.  She presents today due to acute changes to the right great toe.  She says that there is been a significant increase in pain.  She presents for further treatment and evaluation  Past Medical History:  Diagnosis Date  . Acute respiratory failure ([place])   . Anemia    previous blood transfusions  . Arthritis    "all over"  . Asthma   . Bradycardia    requiring previous d/c of BB and reduction of amiodarone  . CAD (coronary artery disease)    nonobstructive per notes  . Chronic diastolic CHF (congestive heart failure) ([place])   . CKD (chronic kidney disease), stage III ([place])   . Complication of blood transfusion    "got the wrong blood type at [facility] in ~ 2015; no adverse reaction that we are aware of"/daughter, [name] (01/27/2016)  . COPD (chronic obstructive pulmonary disease) ([place])   . Depression    "light case"  . DVT (deep venous thrombosis) ([place]) 01/2016   a. LLE DVT 01/2016 - switched from Eliquis to Coumadin.  [name] Dyspnea    with some activity  . Gastric stenosis    a. s/p stomach tube  . GERD (gastroesophageal reflux disease)   . History of blood transfusion    "several" (01/27/2016)  . History of stomach ulcers   . Hyperlipidemia   . Hypertension   . Hypothyroidism   . On home oxygen therapy    "2L; 9PM til 9AM" (06/27/2017)  . Paraesophageal hernia   . Perforated gastric ulcer ([place])   . Peripheral vascular disease ([place])   . Pneumonia    "a few times" (06/27/2017)  . Seasonal allergies   . SIADH (syndrome of  inappropriate ADH production) ([place])    [name] 01/10/2015  . Small bowel obstruction ([place])    "I don't know how many" (01/11/2015)  . Stroke [facility])    several- no residual  . Type II diabetes mellitus ([place])    "related to prednisone use  for > 20 yr; once predinose stopped; no more DM RX" (01/27/2016)  . UTI (urinary tract infection) 02/08/2016  . Ventral hernia with bowel obstruction        Physical Exam: General: The patient is alert and oriented x3 in no acute distress.  Dermatology: Significant acute deterioration of the ulcer noted to the right hallux since prior exam.  Today there does appear to be some exposed bone noted to the ulceration site with a significant amount of erythema and edema to the great toe.  Ulceration measures about the same measuring 2.0 x 2.0 x 0.3 cm.  As noted there is exposed bone.  Vascular: Diminished pedal pulses bilaterally.  Patient has been treated by vascular and vein specialists over the past year.  Neurological: Epicritic and protective threshold diminished bilaterally.   Musculoskeletal Exam: Ulcer noted right hallux.  No other wounds or ulcerations noted at this time.  Radiographic Exam:  X-rays taken today are consistent with osteomyelitis to the right hallux.  Osteolytic portions noted.  Assessment: 1.  Osteomyelitis right great toe  2.  Peripheral arterial disease 3.  Acute cellulitis right great toe   Plan of Care:  1. Patient evaluated. X-Rays reviewed.  2.  Today I explained to the patient and her daughter that she will likely need great toe amputation.  This needs to be either under observation or as an admission so we can monitor the patient postoperatively.  We are going to work on directly admitting the patient to the [facility]. 3.  Patient is also scheduled to follow-up with vascular over the next month.  I will get in touch with Vascular and Vein Specialists to see if they would like to reevaluate her and manage her  while she is in the hospital.  Patient may need new arterial studies perioperatively to ensure optimize circulation to promote wound healing. 4.  Medically necessary excisional debridement including muscle and deep fascial tissue as well as some joint capsule was performed using a tissue nipper today.  Excisional debridement of all the necrotic nonviable tissue down to healthy bleeding viable tissue was performed with post debridement measurement same as pre-.  Dry sterile dressings were applied to the ulceration today.  Patient left the office today and I informed her that either our office or [facility] will contact her regarding direct admission into the hospital for toe amputation and management. 5.  Return to clinic as needed      Edrick Kins, DPM [facility]  Dr. Edrick Kins, DPM    [address], [place] [address]                Office [phone]  Fax [phone]

## 2018-05-13 DIAGNOSIS — N179 Acute kidney failure, unspecified: Secondary | ICD-10-CM | POA: Diagnosis not present

## 2018-05-13 DIAGNOSIS — Z4781 Encounter for orthopedic aftercare following surgical amputation: Secondary | ICD-10-CM | POA: Diagnosis not present

## 2018-05-13 DIAGNOSIS — M86171 Other acute osteomyelitis, right ankle and foot: Secondary | ICD-10-CM | POA: Diagnosis not present

## 2018-05-13 DIAGNOSIS — E1152 Type 2 diabetes mellitus with diabetic peripheral angiopathy with gangrene: Secondary | ICD-10-CM | POA: Diagnosis not present

## 2018-05-13 DIAGNOSIS — I130 Hypertensive heart and chronic kidney disease with heart failure and stage 1 through stage 4 chronic kidney disease, or unspecified chronic kidney disease: Secondary | ICD-10-CM | POA: Diagnosis not present

## 2018-05-13 DIAGNOSIS — D62 Acute posthemorrhagic anemia: Secondary | ICD-10-CM | POA: Diagnosis not present

## 2018-05-13 NOTE — Telephone Encounter (Signed)
Transition Care Management Follow-up Telephone Call  Date of discharge and from where: 05/03/18 [place]  How have you been since you were released from the hospital? Daughter states patient has been doing good  Any questions or concerns? No   Items Reviewed:  Did the pt receive and understand the discharge instructions provided? Yes   Medications obtained and verified? Yes   Any new allergies since your discharge? No   Dietary orders reviewed? Yes  Do you have support at home? Yes   Other (ie: DME, Home Health, etc) Home Health PT  Functional Questionnaire: (I = Independent and D = Dependent) ADL's: I with assistance   Bathing/Dressing- I with assistance   Meal Prep- D-daughter  Eating- I  Maintaining continence- I  Transferring/Ambulation- I  Managing Meds- Assistance   Follow up appointments reviewed:    PCP Hospital f/u appt confirmed? Yes  Scheduled to see Dinah on 05/14/2018 @ 11:00am.  [facility] f/u appt confirmed? Yes    Are transportation arrangements needed? No   If their condition worsens, is the pt aware to call  their PCP or go to the ED? Yes  Was the patient provided with contact information for the PCP's office or ED? Yes  Was the pt encouraged to call back with questions or concerns? Yes

## 2018-05-14 ENCOUNTER — Encounter: Payer: Self-pay | Admitting: Family

## 2018-05-14 ENCOUNTER — Ambulatory Visit (INDEPENDENT_AMBULATORY_CARE_PROVIDER_SITE_OTHER): Payer: Medicare Other | Admitting: Family

## 2018-05-14 VITALS — BP 140/80 | HR 55 | Temp 97.8°F | Resp 18 | Ht 66.0 in | Wt 148.6 lb

## 2018-05-14 DIAGNOSIS — I739 Peripheral vascular disease, unspecified: Secondary | ICD-10-CM | POA: Diagnosis not present

## 2018-05-14 DIAGNOSIS — I5032 Chronic diastolic (congestive) heart failure: Secondary | ICD-10-CM | POA: Diagnosis not present

## 2018-05-14 DIAGNOSIS — I70235 Atherosclerosis of native arteries of right leg with ulceration of other part of foot: Secondary | ICD-10-CM | POA: Diagnosis not present

## 2018-05-14 DIAGNOSIS — N183 Chronic kidney disease, stage 3 unspecified: Secondary | ICD-10-CM

## 2018-05-14 DIAGNOSIS — J449 Chronic obstructive pulmonary disease, unspecified: Secondary | ICD-10-CM

## 2018-05-14 DIAGNOSIS — D638 Anemia in other chronic diseases classified elsewhere: Secondary | ICD-10-CM | POA: Diagnosis not present

## 2018-05-14 DIAGNOSIS — S98111A Complete traumatic amputation of right great toe, initial encounter: Secondary | ICD-10-CM

## 2018-05-14 DIAGNOSIS — E119 Type 2 diabetes mellitus without complications: Secondary | ICD-10-CM | POA: Diagnosis not present

## 2018-05-14 NOTE — Addendum Note (Signed)
Addended byMarlowe Sax C on: 05/14/2018 03:10 PM   Modules accepted: Level of Service

## 2018-05-14 NOTE — Patient Instructions (Signed)
1. Labs drawn CBC follow up blood loss post surgery.CMP follow up chronic kidney disease.   2. Follow up with Vascular surgery as directed.  3. Notify provider's office if running any fever,chills or drainage from wound site  4. Continue with physical therapy

## 2018-05-14 NOTE — Progress Notes (Signed)
Provider: Jersee Winiarski FNP-C  [name], NP  Patient Care Team: [name], NP as PCP - General (Geriatric Medicine) Edrick Kins, DPM as Consulting Physician (Podiatry) Dorothy Spark, MD as Consulting Physician (Cardiology) [name], NP as Nurse Practitioner (Geriatric Medicine) Camillo Flaming, [place] as Referring Physician [facility])  Extended Emergency Contact Information Primary Emergency Contact: Armstrong,[name] Address: [address]          [place], [place] [address] Johnnette Litter of [place] Phone: [phone] Mobile Phone: [phone] Relation: Daughter Secondary Emergency Contact: Matte,John  [place] Phone: [phone] Relation: Son  Goals of care: Advanced Directive information Advanced Directives 05/03/2018  Does Patient Have a Medical Advance Directive? Yes  Type of Paramedic of [place];Living will  Does patient want to make changes to medical advance directive? No - Patient declined  Copy of H. Rivera Colon in Chart? Yes - validated most recent copy scanned in chart (See row information)  Would patient like information on creating a medical advance directive? -     Chief Complaint  Patient presents with  . Hospitalization Follow-up    Hospitalized from 05/03/2018 - 05/09/2018     HPI:  Pt is a 85 y.o. female seen today for an acute visit for follow up hospital admission from 05/03/2018-05/09/2018 for right foot acute osteomyelitis.she had Gangrene on right great toe underwent  amputation of right great toe.Ambulates with Darco shoe. She states started physical therapy 05/13/2018.Has post-op  follow up appointment with Dr.Early vascular surgeon 05/28/2018.    Type 2 DM- states CBG has improved compared to prior to surgery.No log brought to visit.on Lantus 10 units daily at bedtime and Novolog three times with meals.  CKD stage 3 - CR 2.18 on hospital admission but  improved to 1.5 with hydration.  Anemia - Hgb 7.9 post right great toe amputation.on ferrous sulfate.Denies any dizziness or headache.     COPD- Oxygen at  2 liter at home but on room air during visit.chronic shortness of breath and wheezing.On Duoneb 3 ml every 6 hours as needed,Levalbuterol 2 puffs every 6 hours as needed,Dulera 2 puffs twice daily.    Dysphagia - Has PEG tube.On Osmolite 1.5 receives 711 ml from 9pm to 9 Am.Also eats pudding,yorgut and cream chicken soup.she denies any signs of aspiration or difficulty swallowing. She has had a 1.4 lbs weight loss since last visit 04/17/2018 possible due to her recent Hospital admission.she states has a good appetite.   Hypertension- B/p stable on hydralazine 50 mg tablet three times daily,coreg 6.25 mg tablet twice daily,Furosemide 40 mg tablet daily and Imdur 60 mg tablet daily.   Hypothyroidism- on levothyroxine 125 mcg tablet daily.Dosage reduced from 137 mcg tablet daily by PCP.   Diastolic CHF - Has chronic shortness of breath with exertion when walking long distance.on hydralazine 50 mg tablet three times daily,coreg 6.25 mg tablet twice daily,Furosemide 40 mg tablet daily and Imdur 60 mg tablet daily.no abrupt weight gain or cough.   Past Medical History:  Diagnosis Date  . Acute respiratory failure ([place])   . Anemia    previous blood transfusions  . Arthritis    "all over"  . Asthma   . Bradycardia    requiring previous d/c of BB and reduction of amiodarone  . CAD (coronary artery disease)    nonobstructive per notes  . Chronic diastolic CHF (congestive heart failure) ([place])   . CKD (chronic kidney disease), stage III ([place])   .  Complication of blood transfusion    "got the wrong blood type at [facility] in ~ 2015; no adverse reaction that we are aware of"/daughter, [name] (01/27/2016)  . COPD (chronic obstructive pulmonary disease) ([place])   . Depression    "light case"  . DVT (deep venous thrombosis) ([place]) 01/2016   a. LLE  DVT 01/2016 - switched from Eliquis to Coumadin.  [name] Dyspnea    with some activity  . Gastric stenosis    a. s/p stomach tube  . GERD (gastroesophageal reflux disease)   . History of blood transfusion    "several" (01/27/2016)  . History of stomach ulcers   . Hyperlipidemia   . Hypertension   . Hypothyroidism   . On home oxygen therapy    "2L; 9PM til 9AM" (06/27/2017)  . Paraesophageal hernia   . Perforated gastric ulcer ([place])   . Peripheral vascular disease ([place])   . Pneumonia    "a few times" (06/27/2017)  . Seasonal allergies   . SIADH (syndrome of inappropriate ADH production) ([place])    [name] 01/10/2015  . Small bowel obstruction ([place])    "I don't know how many" (01/11/2015)  . Stroke [facility])    several- no residual  . Type II diabetes mellitus ([place])    "related to prednisone use  for > 20 yr; once predinose stopped; no more DM RX" (01/27/2016)  . UTI (urinary tract infection) 02/08/2016  . Ventral hernia with bowel obstruction    Past Surgical History:  Procedure Laterality Date  . ABDOMINAL AORTOGRAM N/A 09/21/2016   Procedure: Abdominal Aortogram;  Surgeon: Waynetta Sandy, MD;  Location: [place] CV LAB;  Service: Cardiovascular;  Laterality: N/A;  . AMPUTATION Left 09/25/2016   Procedure: AMPUTATION DIGIT- LEFT 2ND AND 3RD TOES;  Surgeon: Rosetta Posner, MD;  Location: [place];  Service: Vascular;  Laterality: Left;  . AMPUTATION Right 05/03/2018   Procedure: AMPUTATION RIGHT GREAT TOE;  Surgeon: Rosetta Posner, MD;  Location: [place];  Service: Vascular;  Laterality: Right;  . CATARACT EXTRACTION W/ INTRAOCULAR LENS  IMPLANT, BILATERAL    . CHOLECYSTECTOMY OPEN    . COLECTOMY    . ESOPHAGOGASTRODUODENOSCOPY N/A 01/19/2014   Procedure: ESOPHAGOGASTRODUODENOSCOPY (EGD);  Surgeon: Irene Shipper, MD;  [facility];  Service: Endoscopy;  Laterality: N/A;  . ESOPHAGOGASTRODUODENOSCOPY N/A 01/20/2014   Procedure: ESOPHAGOGASTRODUODENOSCOPY (EGD);  Surgeon: Irene Shipper, MD;  Location: [facility];  Service: Endoscopy;  Laterality: N/A;  . ESOPHAGOGASTRODUODENOSCOPY N/A 03/19/2014   Procedure: ESOPHAGOGASTRODUODENOSCOPY (EGD);  Surgeon: Milus Banister, MD;  Location: [facility];  Service: Endoscopy;  Laterality: N/A;  . ESOPHAGOGASTRODUODENOSCOPY N/A 07/08/2015   Procedure: ESOPHAGOGASTRODUODENOSCOPY (EGD);  Surgeon: Doran Stabler, MD;  [facility];  Service: Endoscopy;  Laterality: N/A;  . ESOPHAGOGASTRODUODENOSCOPY (EGD) WITH PROPOFOL N/A 09/15/2015   Procedure: ESOPHAGOGASTRODUODENOSCOPY (EGD) WITH PROPOFOL;  Surgeon: Manus Gunning, MD;  [facility];  Service: Gastroenterology;  Laterality: N/A;  . GASTROJEJUNOSTOMY     hx/notes 01/10/2015  . GASTROJEJUNOSTOMY N/A 09/23/2015   Procedure: OPEN GASTROJEJUNOSTOMY TUBE PLACEMENT;  Surgeon: Arta Bruce Kinsinger, MD;  [facility];  Service: General;  Laterality: N/A;  . GLAUCOMA SURGERY Bilateral   . HERNIA REPAIR  2015  . IR CM INJ ANY COLONIC TUBE W/FLUORO  01/05/2017  . IR CM INJ ANY COLONIC TUBE W/FLUORO  06/07/2017  . IR CM INJ ANY COLONIC TUBE W/FLUORO  11/05/2017  . IR GENERIC HISTORICAL  01/07/2016  IR GJ TUBE CHANGE 01/07/2016 Jacqulynn Cadet, MD [facility]  . IR GENERIC HISTORICAL  01/27/2016   IR MECH REMOV OBSTRUC MAT ANY COLON TUBE W/FLUORO 01/27/2016 Markus Daft, MD [facility]  . IR GENERIC HISTORICAL  02/07/2016   IR PATIENT EVAL TECH 0-60 MINS Darrell K Allred, PA-C [facility]  . IR GENERIC HISTORICAL  02/08/2016   IR GJ TUBE CHANGE 02/08/2016 Greggory Keen, MD [facility]  . IR GENERIC HISTORICAL  01/06/2016   IR GJ TUBE CHANGE 01/06/2016 [facility] OUT REF  . IR GENERIC HISTORICAL  05/02/2016   IR CM INJ ANY COLONIC TUBE W/FLUORO 05/02/2016 Arne Cleveland, MD [facility]  . IR GENERIC HISTORICAL  05/15/2016   IR GJ TUBE CHANGE 05/15/2016 Sandi Mariscal, MD [facility]  . IR GENERIC HISTORICAL  06/28/2016   IR GJ TUBE CHANGE 06/28/2016 [facility]    . IR GJ TUBE CHANGE  02/20/2017  . IR GJ TUBE CHANGE  05/10/2017  . IR GJ TUBE CHANGE  07/06/2017  . IR GJ TUBE CHANGE  08/02/2017  . IR GJ TUBE CHANGE  10/15/2017  . IR GJ TUBE CHANGE  12/26/2017  . IR [place] TUBE CHANGE  04/08/2018  . IR PATIENT EVAL TECH 0-60 MINS  10/19/2016  . IR PATIENT EVAL TECH 0-60 MINS  12/25/2016  . IR PATIENT EVAL TECH 0-60 MINS  01/29/2017  . IR PATIENT EVAL TECH 0-60 MINS  04/04/2017  . IR PATIENT EVAL TECH 0-60 MINS  04/30/2017  . IR PATIENT EVAL TECH 0-60 MINS  07/31/2017  . IR REPLC DUODEN/JEJUNO TUBE PERCUT W/FLUORO  11/14/2016  . LAPAROTOMY N/A 01/20/2015   Procedure: EXPLORATORY LAPAROTOMY;  Surgeon: Coralie Keens, MD;  Location: [place];  Service: General;  Laterality: N/A;  . LOWER EXTREMITY ANGIOGRAPHY Left 09/21/2016   Procedure: Lower Extremity Angiography;  Surgeon: Waynetta Sandy, MD;  Location: [place] [facility];  Service: Cardiovascular;  Laterality: Left;  . LOWER EXTREMITY ANGIOGRAPHY Right 06/27/2017   Procedure: Lower Extremity Angiography;  Surgeon: Waynetta Sandy, MD;  Location: [place] [facility];  Service: Cardiovascular;  Laterality: Right;  . LYSIS OF ADHESION N/A 01/20/2015   Procedure: LYSIS OF ADHESIONS < 1 HOUR;  Surgeon: Coralie Keens, MD;  Location: [place];  Service: General;  Laterality: N/A;  . PERIPHERAL VASCULAR BALLOON ANGIOPLASTY Left 09/21/2016   Procedure: Peripheral Vascular Balloon Angioplasty;  Surgeon: Waynetta Sandy, MD;  Location: [place] [facility];  Service: Cardiovascular;  Laterality: Left;  drug coated balloon  . PERIPHERAL VASCULAR BALLOON ANGIOPLASTY Right 06/27/2017   Procedure: PERIPHERAL VASCULAR BALLOON ANGIOPLASTY;  Surgeon: Waynetta Sandy, MD;  Location: [place] [facility];  Service: Cardiovascular;  Laterality: Right;  SFA/TPTRUNK  . TONSILLECTOMY    . TUBAL LIGATION    . VENTRAL HERNIA REPAIR  2015   incarcerated ventral hernia ([facility] 09/2013)/notes 01/10/2015    Allergies   Allergen Reactions  . Penicillins Itching, Rash and Other (See Comments)    Tolerated amoxicillin, unasyn, zosyn & cephalosporins in the past. Did it involve swelling of the face/tongue/throat, SOB, or low BP? No Did it involve sudden or severe rash/hives, skin peeling, or any reaction on the inside of your mouth or nose? No Did you need to seek medical attention at a hospital or doctor's office? Unknown When did it last happen?5+ years If all above answers are "NO", may proceed with cephalosporin use.     Outpatient Encounter Medications as of 05/14/2018  Medication Sig  .  acetaminophen (TYLENOL) 500 MG tablet Take 500 mg by mouth every 6 (six) hours as needed (pain).   . Amino Acids-Protein Hydrolys (FEEDING SUPPLEMENT, PRO-STAT SUGAR FREE 64,) LIQD Take 30 mLs by mouth 2 (two) times daily.  [name] atorvastatin (LIPITOR) 10 MG tablet TAKE 1 TABLET BY MOUTH EVERY DAY  . b complex vitamins tablet Take 1 tablet by mouth daily.   . calcium-vitamin D (OSCAL WITH D) 500-200 MG-UNIT per tablet Take 1 tablet by mouth daily with breakfast.   . Carboxymethylcellulose Sodium (ARTIFICIAL TEARS OP) Place 1 drop into both eyes 4 (four) times daily.  . carvedilol (COREG) 6.25 MG tablet Take 1 tablet (6.25 mg total) by mouth 2 (two) times daily.  . collagenase (SANTYL) ointment Apply 1 application topically daily. (Patient taking differently: Apply 1 application topically daily as needed (wound care). )  . cycloSPORINE (RESTASIS) 0.05 % ophthalmic emulsion USE 1 DROP INTO BOTH EYES TWICE DAILY  . feeding supplement (OSMOLITE 1 CAL) LIQD Take 711 mLs by mouth See admin instructions. For 12 hours 2100 - 0900  . ferrous sulfate 325 (65 FE) MG tablet Take 1 tablet (325 mg total) by mouth daily.  . fluticasone (FLONASE) 50 MCG/ACT nasal spray Spray 2 sprays into each nostril once daily (Patient taking differently: Place 2 sprays into both nostrils 2 (two) times daily. )  . furosemide (LASIX) 40 MG tablet  TAKE 1 TABLET BY MOUTH EVERY DAY  . gabapentin (NEURONTIN) 300 MG capsule Take 1 capsule (300 mg total) by mouth 2 (two) times daily.  [name] gentamicin cream (GARAMYCIN) 0.1 % Apply 1 application topically 3 (three) times daily.  . hydrALAZINE (APRESOLINE) 50 MG tablet Take 50 mg by mouth 3 (three) times daily.  . insulin aspart (NOVOLOG FLEXPEN) 100 UNIT/ML FlexPen Take three times daily before each meal based on sliding scale 140-199( 2 units), 200-250 (4 units), 251-299 (6 units), 300-349 (8 units), > 350(10 units)  . Insulin Glargine (LANTUS SOLOSTAR) 100 UNIT/ML Solostar Pen INJECT 10 UNITS DAILY TO CONTROL BLOOD SUGAR (Patient taking differently: Inject 10 Units into the skin at bedtime. )  . Insulin Pen Needle (B-D UF III MINI PEN NEEDLES) 31G X 5 MM MISC Use twice daily with giving insulin injections. Dx E11.9  . ipratropium-albuterol (DUONEB) 0.5-2.5 (3) MG/3ML SOLN Take 3 mLs by nebulization every 6 (six) hours as needed (sob). Use 3 times daily x 4 days then every 6 hours as needed. (Patient taking differently: Take 3 mLs by nebulization every 6 (six) hours as needed (sob). )  . isosorbide mononitrate (IMDUR) 30 MG 24 hr tablet TAKE 2 TABLETS (60 MG TOTAL) BY MOUTH DAILY.  [name] latanoprost (XALATAN) 0.005 % ophthalmic solution Place 1 drop into both eyes at bedtime.  . levalbuterol (XOPENEX HFA) 45 MCG/ACT inhaler Inhale 2 puffs into the lungs every 6 (six) hours as needed for wheezing or shortness of breath.  . levothyroxine (SYNTHROID, LEVOTHROID) 125 MCG tablet Take 1 tablet (125 mcg total) by mouth daily.  [name] loratadine (CLARITIN) 10 MG tablet Take 10 mg by mouth daily as needed for allergies.  . mometasone-formoterol (DULERA) 200-5 MCG/ACT AERO Take two puffs by mouth twice daily  . Multiple Vitamin (MULTIVITAMIN WITH MINERALS) TABS tablet Take 1 tablet by mouth daily.  [name] nystatin (MYCOSTATIN/NYSTOP) powder Apply topically 3 (three) times daily. (Patient taking differently: Apply 1 g topically  3 (three) times daily as needed (raw skin). )  . oxyCODONE (ROXICODONE) 5 MG immediate release tablet Take 1 tablet (5  mg total) by mouth every 4 (four) hours as needed for severe pain. May take 1/2 tab q4hrs as needed for mild pain. (Patient taking differently: Take 2.5 mg by mouth 2 (two) times daily as needed for severe pain. )  . OXYGEN Inhale 2 L into the lungs at bedtime.  . pantoprazole (PROTONIX) 40 MG tablet TAKE 1 TABLET BY MOUTH TWICE A DAY  . sertraline (ZOLOFT) 100 MG tablet TAKE 1 TABLET BY MOUTH EVERY DAY FOR DEPRESSION (Patient taking differently: Take 100 mg by mouth daily. )  . warfarin (COUMADIN) 2.5 MG tablet TAKE AS DIRECTED BY COUMADIN CLINIC. (Patient taking differently: Take 2.5 mg by mouth See admin instructions. Take 5 mg every evening except on Friday take 2.5 mg in the evening)  . Water For Irrigation, Sterile (FREE WATER) SOLN Place 120 mLs into feeding tube 3 (three) times daily.   Dema Severin Petrolatum-Mineral Oil (SYSTANE NIGHTTIME) OINT Place 1 application into both eyes at bedtime.    No facility-administered encounter medications on file as of 05/14/2018.     Review of Systems  Constitutional: Negative for appetite change, chills, fatigue, fever and unexpected weight change.  HENT: Positive for hearing loss. Negative for congestion, ear pain, postnasal drip, rhinorrhea, sinus pressure, sinus pain, sneezing and sore throat.        Dysphagia.  Eyes: Positive for visual disturbance. Negative for pain, discharge, redness and itching.       Wears eye glasses   Respiratory: Negative for cough and chest tightness.        Chronic shortness of breath with exertion and wheezing stable   Cardiovascular: Negative for chest pain, palpitations and leg swelling.  Gastrointestinal: Negative for abdominal distention, abdominal pain, constipation, diarrhea, nausea and vomiting.  Endocrine: Negative for cold intolerance, heat intolerance, polydipsia, polyphagia and polyuria.   Genitourinary: Negative for dysuria, flank pain, frequency and urgency.  Musculoskeletal: Positive for gait problem.       Right foot pain under control with current pain medication.   Skin: Negative for color change, pallor and rash.       Right foot incision dressing covered in plastic bag " to protect from getting wet from the rains". POA states no signs of infection.   Neurological: Negative for dizziness, light-headedness and headaches.       Hx peripheral neuropathy.   Hematological: Does not bruise/bleed easily.  Psychiatric/Behavioral: Negative for agitation, confusion and sleep disturbance. The patient is not nervous/anxious.     Immunization History  Administered Date(s) Administered  . Influenza, High Dose Seasonal PF 01/19/2017  . Influenza,inj,Quad PF,6+ Mos 12/24/2013, 01/27/2015, 12/29/2015, 12/04/2017  . Influenza-Unspecified 12/02/2012  . Pneumococcal Conjugate-13 02/03/2014  . Pneumococcal Polysaccharide-23 08/31/2016  . Tdap 07/18/2016  . Zoster 04/12/2011   Pertinent  Health Maintenance Due  Topic Date Due  . FOOT EXAM  08/22/2017  . OPHTHALMOLOGY EXAM  05/07/2018  . MAMMOGRAM  10/17/2023 (Originally 10/23/2015)  . HEMOGLOBIN A1C  10/16/2018  . INFLUENZA VACCINE  Completed  . DEXA SCAN  Completed  . PNA vac Low Risk Adult  Completed   Fall Risk  05/14/2018 04/17/2018 12/04/2017 10/05/2017 08/02/2017  Falls in the past year? 0 0 No No No  Number falls in past yr: 0 - - - -  Injury with Fall? 0 - - - -  Risk for fall due to : - - - - -    Vitals:   05/14/18 1151  Weight: 148 lb 9.6 oz (67.4 kg)  Height: 5'  6" (1.676 m)   Body mass index is 23.98 kg/m. Physical Exam Vitals signs reviewed.  Constitutional:      General: She is not in acute distress. HENT:     Head: Normocephalic.     Comments: Missing all teeth     Right Ear: Tympanic membrane, ear canal and external ear normal.     Left Ear: Tympanic membrane, ear canal and external ear normal. There is  no impacted cerumen.     Ears:     Comments: Bilateral ear Hearing aids in place     Nose: Nose normal. No congestion or rhinorrhea.     Mouth/Throat:     Mouth: Mucous membranes are moist.     Pharynx: Oropharynx is clear. No oropharyngeal exudate or posterior oropharyngeal erythema.  Eyes:     General: No scleral icterus.       Right eye: No discharge.        Left eye: No discharge.     Extraocular Movements: Extraocular movements intact.     Conjunctiva/sclera: Conjunctivae normal.     Pupils: Pupils are equal, round, and reactive to light.     Comments: Corrective lens in place   Neck:     Musculoskeletal: Normal range of motion. No neck rigidity or muscular tenderness.     Vascular: No carotid bruit.  Cardiovascular:     Rate and Rhythm: Normal rate and regular rhythm.     Heart sounds: Murmur present. No friction rub. No gallop.   Pulmonary:     Effort: Pulmonary effort is normal. No respiratory distress.     Breath sounds: Normal breath sounds. No wheezing, rhonchi or rales.  Chest:     Chest wall: No tenderness.  Abdominal:     General: Bowel sounds are normal. There is no distension.     Palpations: Abdomen is soft. There is no mass.     Tenderness: There is no abdominal tenderness. There is no right CVA tenderness, left CVA tenderness, guarding or rebound.     Comments: Left Quadrant PEG tube dressing dry and clean.   Musculoskeletal:     Comments: Unsteady gait ambulates with Rolator.bilateral lower extremities trace edema. Status post right foot great toe amputation dressing dry and intact.   Lymphadenopathy:     Cervical: No cervical adenopathy.  Skin:    General: Skin is warm and dry.     Coloration: Skin is not jaundiced or pale.     Findings: No erythema, lesion or rash.     Comments: Right foot incision not not visualized.drsg intact per POA no signs of infections.Has upcoming appoint with vascular surgeon for follow up.Boot in place    Neurological:      Mental Status: She is alert and oriented to person, place, and time.     Cranial Nerves: No cranial nerve deficit.     Coordination: Coordination normal.     Gait: Gait abnormal.  Psychiatric:        Mood and Affect: Mood normal.        Behavior: Behavior normal.        Thought Content: Thought content normal.        Judgment: Judgment normal.    Labs reviewed: Recent Labs    05/06/18 0322 05/07/18 0259 05/09/18 0311  NA 138 135 136  K 4.4 4.1 3.9  CL 109 103 102  CO2 23 21* 22  GLUCOSE 88 211* 208*  BUN 35* 37* 40*  CREATININE 1.55* 1.30* 1.28*  CALCIUM 8.9 8.9 9.0   Recent Labs    08/07/17 1119 12/04/17 1524 04/17/18 1526 05/03/18 1055  AST _0 42*  ALT _1 34  ALKPHOS 99  --   --  59  BILITOT 0.2 0.3 0.4 1.0  PROT 8.1 7.8 7.3 7.6  ALBUMIN 4.1  --   --  3.1*   Recent Labs    08/07/17 1119 10/05/17 1047 04/17/18 1526  05/06/18 0322 05/07/18 0259 05/09/18 0311  WBC 5.8 6.7 5.7   < > 8.0 7.5 8.0  NEUTROABS 3.3 4,402 3,431  --   --   --   --   HGB 8.6* 9.4* 10.3*   < > 7.8* 7.9* 8.5*  HCT 26.9* 29.7* 31.5*   < > 23.0* 24.1* 25.1*  MCV 88 88.9 93.8   < > 90.9 91.3 90.3  PLT 331 265 207   < > 223 235 288   < > = values in this interval not displayed.   Lab Results  Component Value Date   TSH 0.15 (L) 04/17/2018   Lab Results  Component Value Date   HGBA1C 6.6 (H) 04/17/2018   Lab Results  Component Value Date   CHOL 97 08/01/2017   HDL 53 08/01/2017   LDLCALC 23 08/01/2017   TRIG 120 08/01/2017   CHOLHDL 1.8 08/01/2017    Significant Diagnostic Results in last 30 days:  Dg Foot Complete Right  Result Date: 05/01/2018 Please see detailed radiograph report in office note.   Assessment/Plan 1. Diabetes mellitus type 2 in nonobese [facility]) Lab Results  Component Value Date   HGBA1C 6.6 (H) 04/17/2018   Per pt CBG readings have improved post right great toe amputation.continue on Lantus 10 units daily at bedtime and Novolog three times  with meals.on anticoagulant and Statin.  2. PAD (peripheral artery disease) (HCC) Status post right great toe amputation.trace edema noted.Continue on furosemide 40 mg tablet daily.continue to follow up with vascular specialist.   3. Chronic kidney insufficiency, stage 3 (moderate) (HCC) CR 2.18 on hospital admission but improved to 1.5 with hydration.continue renal dosing of all medication and avoid NSAID.  - CMP with eGFR([facility])  4. COPD with asthma ([place]) Breathing stable on room air.diminshed bilateral wheezes noted on exam.continue on Duoneb 3 ml every 6 hours as needed,Levalbuterol 2 puffs every 6 hours as needed,Dulera 2 puffs twice daily.       5. Chronic diastolic CHF (congestive heart failure) (HCC) No abrupt weight gain,cough or resting shortness of breath.Last ECHO 05/2016 showed EF 60-65%.continue on hydralazine 50 mg tablet three times daily,coreg 6.25 mg tablet twice daily,Furosemide 40 mg tablet daily and Imdur 60 mg tablet daily  6. Anemia of chronic disease Hgb 7.9 post right great toe amputation.continue on ferrous sulfate 325 mg tablet daily.  - CBC with Differential/Platelet  7. Amputation of right great toe (HCC) Afebrile.no signs or symptoms of infections.continue with Physical Therapy.followup with vascular Surgery as directed.Notify provider's office if  running any fever,chills or drainage from wound site  - CBC with Differential/Platelet  Family/ staff Communication: Reviewed plan of care with patient and POA  Labs/tests ordered:  - CBC with Differential/Platelet - CMP with eGFR([facility])  Nelda Bucks Emili Mcloughlin, NP

## 2018-05-15 ENCOUNTER — Telehealth: Payer: Self-pay | Admitting: *Deleted

## 2018-05-15 DIAGNOSIS — M86171 Other acute osteomyelitis, right ankle and foot: Secondary | ICD-10-CM | POA: Diagnosis not present

## 2018-05-15 DIAGNOSIS — I130 Hypertensive heart and chronic kidney disease with heart failure and stage 1 through stage 4 chronic kidney disease, or unspecified chronic kidney disease: Secondary | ICD-10-CM | POA: Diagnosis not present

## 2018-05-15 DIAGNOSIS — D62 Acute posthemorrhagic anemia: Secondary | ICD-10-CM | POA: Diagnosis not present

## 2018-05-15 DIAGNOSIS — E1152 Type 2 diabetes mellitus with diabetic peripheral angiopathy with gangrene: Secondary | ICD-10-CM | POA: Diagnosis not present

## 2018-05-15 DIAGNOSIS — N179 Acute kidney failure, unspecified: Secondary | ICD-10-CM | POA: Diagnosis not present

## 2018-05-15 DIAGNOSIS — Z4781 Encounter for orthopedic aftercare following surgical amputation: Secondary | ICD-10-CM | POA: Diagnosis not present

## 2018-05-15 LAB — COMPLETE METABOLIC PANEL WITH GFR
AG Ratio: 0.9 (calc) — ABNORMAL LOW (ref 1.0–2.5)
ALT: 22 U/L (ref 6–29)
AST: 24 U/L (ref 10–35)
Albumin: 3.4 g/dL — ABNORMAL LOW (ref 3.6–5.1)
Alkaline phosphatase (APISO): 75 U/L (ref 37–153)
BUN/Creatinine Ratio: 47 (calc) — ABNORMAL HIGH (ref 6–22)
BUN: 67 mg/dL — ABNORMAL HIGH (ref 7–25)
CO2: 28 mmol/L (ref 20–32)
Calcium: 9.5 mg/dL (ref 8.6–10.4)
Chloride: 105 mmol/L (ref 98–110)
Creat: 1.42 mg/dL — ABNORMAL HIGH (ref 0.60–0.88)
GFR, Est African American: 37 mL/min/{1.73_m2} — ABNORMAL LOW (ref >=60)
GFR, Est Non African American: 32 mL/min/{1.73_m2} — ABNORMAL LOW (ref >=60)
Globulin: 3.8 g/dL (calc) — ABNORMAL HIGH (ref 1.9–3.7)
Glucose, Bld: 35 mg/dL — CL (ref 65–139)
Potassium: 4.5 mmol/L (ref 3.5–5.3)
Sodium: 141 mmol/L (ref 135–146)
Total Bilirubin: 0.3 mg/dL (ref 0.2–1.2)
Total Protein: 7.2 g/dL (ref 6.1–8.1)

## 2018-05-15 LAB — CBC WITH DIFFERENTIAL/PLATELET
Absolute Monocytes: 837 cells/uL (ref 200–950)
Basophils Absolute: 62 cells/uL (ref 0–200)
Basophils Relative: 1 %
Eosinophils Absolute: 552 cells/uL — ABNORMAL HIGH (ref 15–500)
Eosinophils Relative: 8.9 %
HCT: 27 % — ABNORMAL LOW (ref 35.0–45.0)
Hemoglobin: 8.8 g/dL — ABNORMAL LOW (ref 11.7–15.5)
Lymphs Abs: 1159 cells/uL (ref 850–3900)
MCH: 30.9 pg (ref 27.0–33.0)
MCHC: 32.6 g/dL (ref 32.0–36.0)
MCV: 94.7 fL (ref 80.0–100.0)
MPV: 11.4 fL (ref 7.5–12.5)
Monocytes Relative: 13.5 %
Neutro Abs: 3590 cells/uL (ref 1500–7800)
Neutrophils Relative %: 57.9 %
Platelets: 366 10*3/uL (ref 140–400)
RBC: 2.85 10*6/uL — ABNORMAL LOW (ref 3.80–5.10)
RDW: 14 % (ref 11.0–15.0)
Total Lymphocyte: 18.7 %
WBC: 6.2 10*3/uL (ref 3.8–10.8)

## 2018-05-15 NOTE — Telephone Encounter (Signed)
Shannon [name] with [facility] called and stated that daughter was questioning the sliding scale dosage. Nurse stated that the daughter was thinking you changed her sliding scale.  Needs Clarification. This is in current medication list, is this correct. Please Advise.   Lantus 8 units qhs  Novolog Take three times daily before each meal. 140-199 (2 units) 200-250 (4 units) 251-299 (6 units) 300-349 (8 units) >350 (10 units)

## 2018-05-15 NOTE — Telephone Encounter (Signed)
No note in Epic concerning Dr. Steve Rattler changes to insulin. Routed message to Dr. Lyndel Safe for clarification on dosage of Novolog. Per Shannon [name] Nurse Daughter cannot remember exact instructions that was given to her and I was not aware that daughter had spoken with Dr. Lyndel Safe due to no documentation.   Please Clarify instructions.

## 2018-05-15 NOTE — Telephone Encounter (Signed)
I was called by [facility] as Patient BS was 35 in her BMP  Drawn from the office at 12.30 pm. The results were called to me at 2 am. I called the patient this morning. And talked to her daughter. She gets 10 u of Lantus at night. And then Novolog Sliding scale. Her sugars were 250 yesterday morning so she gave her 6 units of Novolog I have told her to back off on her sliding scale.She also said that yesterday her Mom ate less then usual. She is on PEG tube feeds at night and Liquids during day time. She is also on Water Flushes Patient is at baseline. Her daughter will watch her sugars today. Will have her review her sliding scale with her PCP. Sh ewill also Need increase in her water flushes

## 2018-05-15 NOTE — Telephone Encounter (Signed)
Dr.Gupta instructed patient's daughter on Novolog sliding scale last night while she was on call.so continue with MD orders.  I recommended patient to decrease Lantus from 10 units SQ at bedtime to 8 units SQ at bedtime due to Glucose 35 on labs.   Increase water flushes to 150 cc via PEG tube.BUN 63

## 2018-05-15 NOTE — Telephone Encounter (Signed)
Please let her know of New Sliding scale 151-251 2 units 251-350 4 units 351-450 6 units To call Provider if BS runs more then 400

## 2018-05-16 DIAGNOSIS — I130 Hypertensive heart and chronic kidney disease with heart failure and stage 1 through stage 4 chronic kidney disease, or unspecified chronic kidney disease: Secondary | ICD-10-CM | POA: Diagnosis not present

## 2018-05-16 DIAGNOSIS — M86171 Other acute osteomyelitis, right ankle and foot: Secondary | ICD-10-CM | POA: Diagnosis not present

## 2018-05-16 DIAGNOSIS — E1152 Type 2 diabetes mellitus with diabetic peripheral angiopathy with gangrene: Secondary | ICD-10-CM | POA: Diagnosis not present

## 2018-05-16 DIAGNOSIS — Z4781 Encounter for orthopedic aftercare following surgical amputation: Secondary | ICD-10-CM | POA: Diagnosis not present

## 2018-05-16 DIAGNOSIS — N179 Acute kidney failure, unspecified: Secondary | ICD-10-CM | POA: Diagnosis not present

## 2018-05-16 DIAGNOSIS — D62 Acute posthemorrhagic anemia: Secondary | ICD-10-CM | POA: Diagnosis not present

## 2018-05-16 NOTE — Telephone Encounter (Signed)
Veronda Prude nurse notified and agreed.  Melissa updated Current medication list.

## 2018-05-16 NOTE — Telephone Encounter (Signed)
Updated Patient's medication list to reflect the new sliding scale for Novolog.

## 2018-05-17 DIAGNOSIS — N179 Acute kidney failure, unspecified: Secondary | ICD-10-CM | POA: Diagnosis not present

## 2018-05-17 DIAGNOSIS — Z4781 Encounter for orthopedic aftercare following surgical amputation: Secondary | ICD-10-CM | POA: Diagnosis not present

## 2018-05-17 DIAGNOSIS — D62 Acute posthemorrhagic anemia: Secondary | ICD-10-CM | POA: Diagnosis not present

## 2018-05-17 DIAGNOSIS — I130 Hypertensive heart and chronic kidney disease with heart failure and stage 1 through stage 4 chronic kidney disease, or unspecified chronic kidney disease: Secondary | ICD-10-CM | POA: Diagnosis not present

## 2018-05-17 DIAGNOSIS — E1152 Type 2 diabetes mellitus with diabetic peripheral angiopathy with gangrene: Secondary | ICD-10-CM | POA: Diagnosis not present

## 2018-05-17 DIAGNOSIS — M86171 Other acute osteomyelitis, right ankle and foot: Secondary | ICD-10-CM | POA: Diagnosis not present

## 2018-05-20 DIAGNOSIS — I130 Hypertensive heart and chronic kidney disease with heart failure and stage 1 through stage 4 chronic kidney disease, or unspecified chronic kidney disease: Secondary | ICD-10-CM | POA: Diagnosis not present

## 2018-05-20 DIAGNOSIS — D62 Acute posthemorrhagic anemia: Secondary | ICD-10-CM | POA: Diagnosis not present

## 2018-05-20 DIAGNOSIS — Z4781 Encounter for orthopedic aftercare following surgical amputation: Secondary | ICD-10-CM | POA: Diagnosis not present

## 2018-05-20 DIAGNOSIS — M86171 Other acute osteomyelitis, right ankle and foot: Secondary | ICD-10-CM | POA: Diagnosis not present

## 2018-05-20 DIAGNOSIS — E1152 Type 2 diabetes mellitus with diabetic peripheral angiopathy with gangrene: Secondary | ICD-10-CM | POA: Diagnosis not present

## 2018-05-20 DIAGNOSIS — N179 Acute kidney failure, unspecified: Secondary | ICD-10-CM | POA: Diagnosis not present

## 2018-05-21 DIAGNOSIS — I130 Hypertensive heart and chronic kidney disease with heart failure and stage 1 through stage 4 chronic kidney disease, or unspecified chronic kidney disease: Secondary | ICD-10-CM | POA: Diagnosis not present

## 2018-05-21 DIAGNOSIS — E1152 Type 2 diabetes mellitus with diabetic peripheral angiopathy with gangrene: Secondary | ICD-10-CM | POA: Diagnosis not present

## 2018-05-21 DIAGNOSIS — M86171 Other acute osteomyelitis, right ankle and foot: Secondary | ICD-10-CM | POA: Diagnosis not present

## 2018-05-21 DIAGNOSIS — D62 Acute posthemorrhagic anemia: Secondary | ICD-10-CM | POA: Diagnosis not present

## 2018-05-21 DIAGNOSIS — Z4781 Encounter for orthopedic aftercare following surgical amputation: Secondary | ICD-10-CM | POA: Diagnosis not present

## 2018-05-21 DIAGNOSIS — N179 Acute kidney failure, unspecified: Secondary | ICD-10-CM | POA: Diagnosis not present

## 2018-05-22 ENCOUNTER — Ambulatory Visit (INDEPENDENT_AMBULATORY_CARE_PROVIDER_SITE_OTHER): Payer: Medicare Other | Admitting: *Deleted

## 2018-05-22 DIAGNOSIS — E1152 Type 2 diabetes mellitus with diabetic peripheral angiopathy with gangrene: Secondary | ICD-10-CM | POA: Diagnosis not present

## 2018-05-22 DIAGNOSIS — I4892 Unspecified atrial flutter: Secondary | ICD-10-CM | POA: Diagnosis not present

## 2018-05-22 DIAGNOSIS — I4891 Unspecified atrial fibrillation: Secondary | ICD-10-CM

## 2018-05-22 DIAGNOSIS — M86171 Other acute osteomyelitis, right ankle and foot: Secondary | ICD-10-CM | POA: Diagnosis not present

## 2018-05-22 DIAGNOSIS — D62 Acute posthemorrhagic anemia: Secondary | ICD-10-CM | POA: Diagnosis not present

## 2018-05-22 DIAGNOSIS — Z5181 Encounter for therapeutic drug level monitoring: Secondary | ICD-10-CM

## 2018-05-22 DIAGNOSIS — N179 Acute kidney failure, unspecified: Secondary | ICD-10-CM | POA: Diagnosis not present

## 2018-05-22 DIAGNOSIS — Z4781 Encounter for orthopedic aftercare following surgical amputation: Secondary | ICD-10-CM | POA: Diagnosis not present

## 2018-05-22 DIAGNOSIS — I130 Hypertensive heart and chronic kidney disease with heart failure and stage 1 through stage 4 chronic kidney disease, or unspecified chronic kidney disease: Secondary | ICD-10-CM | POA: Diagnosis not present

## 2018-05-22 LAB — POCT INR: INR: 2.3 (ref 2.0–3.0)

## 2018-05-22 NOTE — Patient Instructions (Signed)
Description   Continue on same dosage 2 tablets daily except 1 tablet on Fridays.  Recheck in 6 weeks.  Call [facility] with any new medications [phone].

## 2018-05-23 DIAGNOSIS — M86171 Other acute osteomyelitis, right ankle and foot: Secondary | ICD-10-CM | POA: Diagnosis not present

## 2018-05-23 DIAGNOSIS — E1152 Type 2 diabetes mellitus with diabetic peripheral angiopathy with gangrene: Secondary | ICD-10-CM | POA: Diagnosis not present

## 2018-05-23 DIAGNOSIS — N179 Acute kidney failure, unspecified: Secondary | ICD-10-CM | POA: Diagnosis not present

## 2018-05-23 DIAGNOSIS — Z4781 Encounter for orthopedic aftercare following surgical amputation: Secondary | ICD-10-CM | POA: Diagnosis not present

## 2018-05-23 DIAGNOSIS — I130 Hypertensive heart and chronic kidney disease with heart failure and stage 1 through stage 4 chronic kidney disease, or unspecified chronic kidney disease: Secondary | ICD-10-CM | POA: Diagnosis not present

## 2018-05-23 DIAGNOSIS — D62 Acute posthemorrhagic anemia: Secondary | ICD-10-CM | POA: Diagnosis not present

## 2018-05-27 ENCOUNTER — Telehealth: Payer: Self-pay | Admitting: *Deleted

## 2018-05-27 NOTE — Telephone Encounter (Signed)
LMOM of Shannon [name]'s response.

## 2018-05-27 NOTE — Telephone Encounter (Signed)
Patient caregiver, Soledad Gerlach is requesting a In Home Aid Service for patient. Stated that she had surgery and is back at home and needs some help with patient. Stated that this was discussed at a previous appointment. Please Advise.

## 2018-05-27 NOTE — Telephone Encounter (Signed)
Yes she states she would look into services and have [place] complete paperwork if needed

## 2018-05-28 ENCOUNTER — Encounter: Payer: Self-pay | Admitting: Vascular Surgery

## 2018-05-28 ENCOUNTER — Ambulatory Visit (INDEPENDENT_AMBULATORY_CARE_PROVIDER_SITE_OTHER): Payer: Medicare Other | Admitting: Vascular Surgery

## 2018-05-28 VITALS — BP 153/75 | HR 65 | Resp 16 | Ht 66.0 in | Wt 149.0 lb

## 2018-05-28 DIAGNOSIS — I779 Disorder of arteries and arterioles, unspecified: Secondary | ICD-10-CM

## 2018-05-28 NOTE — Progress Notes (Signed)
Patient name: Shannon [name] MRN: [id number] DOB: Oct 31, 1923 Sex: female  REASON FOR VISIT: Follow-up right great toe amputation  HPI: Shannon [name] is a 85 y.o. female here for follow-up.  She underwent right ray amputation of the great toe 3 weeks ago.  She was in the hospital for extended period of time and then was discharged to home.  She has multiple other comorbidities.  She looks quite good today.  She is walking with a Darco shoe and a walker.  She reports that her daughter who normally cares for her had a hip surgery.  Current Outpatient Medications  Medication Sig Dispense Refill  . acetaminophen (TYLENOL) 500 MG tablet Take 500 mg by mouth every 6 (six) hours as needed (pain).     . Amino Acids-Protein Hydrolys (FEEDING SUPPLEMENT, PRO-STAT SUGAR FREE 64,) LIQD Take 30 mLs by mouth 2 (two) times daily.    [name] atorvastatin (LIPITOR) 10 MG tablet TAKE 1 TABLET BY MOUTH EVERY DAY 90 tablet 2  . b complex vitamins tablet Take 1 tablet by mouth daily.     . calcium-vitamin D (OSCAL WITH D) 500-200 MG-UNIT per tablet Take 1 tablet by mouth daily with breakfast.     . Carboxymethylcellulose Sodium (ARTIFICIAL TEARS OP) Place 1 drop into both eyes 4 (four) times daily.    . carvedilol (COREG) 6.25 MG tablet Take 1 tablet (6.25 mg total) by mouth 2 (two) times daily. 180 tablet 1  . collagenase (SANTYL) ointment Apply 1 application topically daily as needed. For wound care    . cycloSPORINE (RESTASIS) 0.05 % ophthalmic emulsion USE 1 DROP INTO BOTH EYES TWICE DAILY 60 mL 3  . feeding supplement (OSMOLITE 1 CAL) LIQD Take 711 mLs by mouth See admin instructions. For 12 hours 2100 - 0900    . ferrous sulfate 325 (65 FE) MG tablet Take 1 tablet (325 mg total) by mouth daily. 90 tablet 1  . fluticasone (FLONASE) 50 MCG/ACT nasal spray Spray 2 sprays into each nostril once daily 16 g 3  . furosemide (LASIX) 40 MG tablet TAKE 1 TABLET BY MOUTH EVERY DAY 90 tablet 1    . gabapentin (NEURONTIN) 300 MG capsule Take 1 capsule (300 mg total) by mouth 2 (two) times daily.    [name] gentamicin cream (GARAMYCIN) 0.1 % Apply 1 application topically 3 (three) times daily. 30 g 1  . hydrALAZINE (APRESOLINE) 50 MG tablet Take 50 mg by mouth 3 (three) times daily.    . insulin aspart (NOVOLOG) 100 UNIT/ML FlexPen Inject 0-6 Units into the skin 3 (three) times daily with meals. New sliding Scale If BS is 151 - 251 = 2 units,  If BS is 251 -  350 = 4 units,  If BS is 351 - 450 = 6 units Call provider if BS runs more than 400    . insulin glargine (LANTUS) 100 UNIT/ML injection Inject 8 Units into the skin at bedtime.     . Insulin Pen Needle (B-D UF III MINI PEN NEEDLES) 31G X 5 MM MISC Use twice daily with giving insulin injections. Dx E11.9 300 each 3  . ipratropium-albuterol (DUONEB) 0.5-2.5 (3) MG/3ML SOLN Take 3 mLs by nebulization every 6 (six) hours as needed (sob). Use 3 times daily x 4 days then every 6 hours as needed. 360 mL 4  . isosorbide mononitrate (IMDUR) 30 MG 24 hr tablet TAKE 2 TABLETS (60 MG TOTAL) BY MOUTH DAILY. 180 tablet 1  . latanoprost (XALATAN) 0.005 %  ophthalmic solution Place 1 drop into both eyes at bedtime. 2.5 mL 12  . levalbuterol (XOPENEX HFA) 45 MCG/ACT inhaler Inhale 2 puffs into the lungs every 6 (six) hours as needed for wheezing or shortness of breath. 1 Inhaler 12  . levothyroxine (SYNTHROID, LEVOTHROID) 125 MCG tablet Take 1 tablet (125 mcg total) by mouth daily. 90 tablet 3  . loratadine (CLARITIN) 10 MG tablet Take 10 mg by mouth daily as needed for allergies.    . mometasone-formoterol (DULERA) 200-5 MCG/ACT AERO Take two puffs by mouth twice daily 13 Inhaler 1  . Multiple Vitamin (MULTIVITAMIN WITH MINERALS) TABS tablet Take 1 tablet by mouth daily.    [name] nystatin (MYCOSTATIN/NYSTOP) powder Apply 1 g topically as needed.    [name] oxyCODONE (ROXICODONE) 5 MG immediate release tablet Take 1 tablet (5 mg total) by mouth every 4 (four) hours as  needed for severe pain. May take 1/2 tab q4hrs as needed for mild pain. 30 tablet 0  . OXYGEN Inhale 2 L into the lungs at bedtime.    . pantoprazole (PROTONIX) 40 MG tablet TAKE 1 TABLET BY MOUTH TWICE A DAY 180 tablet 1  . sertraline (ZOLOFT) 100 MG tablet TAKE 1 TABLET BY MOUTH EVERY DAY FOR DEPRESSION 90 tablet 1  . warfarin (COUMADIN) 2.5 MG tablet Take by mouth See admin instructions. Take 5 mg every evening except Friday take 2.5 mg in the evening.    . Water For Irrigation, Sterile (FREE WATER) SOLN Place 150 mLs into feeding tube 3 (three) times daily.     Cliffton Asters Petrolatum-Mineral Oil (SYSTANE NIGHTTIME) OINT Place 1 application into both eyes at bedtime.      No current facility-administered medications for this visit.      PHYSICAL EXAM: Vitals:   05/28/18 1454  BP: (!) 153/75  Pulse: 65  Resp: 16  SpO2: 98%  Weight: 149 lb 0.5 oz (67.6 kg)  Height: 5\' 6"  (1.676 m)    GENERAL: The patient is a well-nourished female, in no acute distress. The vital signs are documented above. The toe amputation looks quite good.  The lateral aspect is closed with no evidence of skin disruption.  The area at the base of the second toe is open as was done at the time of surgery and is a good granulation tissue    MEDICAL ISSUES: Continued dry gauze to her left foot with home health twice a week.  I will see her again in 1 month for continued follow-up   Larina Earthly, MD [facility] Tel [phone] Pager [phone]

## 2018-05-28 NOTE — Telephone Encounter (Signed)
Shannon [name], caregiver called and stated that they want to use [facility] and stated that she spoke with the Therapist and they will be contacting our office.

## 2018-05-29 DIAGNOSIS — N179 Acute kidney failure, unspecified: Secondary | ICD-10-CM | POA: Diagnosis not present

## 2018-05-29 DIAGNOSIS — Z4781 Encounter for orthopedic aftercare following surgical amputation: Secondary | ICD-10-CM | POA: Diagnosis not present

## 2018-05-29 DIAGNOSIS — M86171 Other acute osteomyelitis, right ankle and foot: Secondary | ICD-10-CM | POA: Diagnosis not present

## 2018-05-29 DIAGNOSIS — I130 Hypertensive heart and chronic kidney disease with heart failure and stage 1 through stage 4 chronic kidney disease, or unspecified chronic kidney disease: Secondary | ICD-10-CM | POA: Diagnosis not present

## 2018-05-29 DIAGNOSIS — D62 Acute posthemorrhagic anemia: Secondary | ICD-10-CM | POA: Diagnosis not present

## 2018-05-29 DIAGNOSIS — E1152 Type 2 diabetes mellitus with diabetic peripheral angiopathy with gangrene: Secondary | ICD-10-CM | POA: Diagnosis not present

## 2018-05-30 ENCOUNTER — Other Ambulatory Visit: Payer: Self-pay | Admitting: Vascular Surgery

## 2018-05-30 DIAGNOSIS — I739 Peripheral vascular disease, unspecified: Secondary | ICD-10-CM

## 2018-06-03 ENCOUNTER — Encounter (HOSPITAL_COMMUNITY): Payer: Medicare Other

## 2018-06-03 ENCOUNTER — Ambulatory Visit: Payer: Medicare Other | Admitting: Family

## 2018-06-04 DIAGNOSIS — N179 Acute kidney failure, unspecified: Secondary | ICD-10-CM | POA: Diagnosis not present

## 2018-06-04 DIAGNOSIS — D62 Acute posthemorrhagic anemia: Secondary | ICD-10-CM | POA: Diagnosis not present

## 2018-06-04 DIAGNOSIS — M86171 Other acute osteomyelitis, right ankle and foot: Secondary | ICD-10-CM | POA: Diagnosis not present

## 2018-06-04 DIAGNOSIS — E1152 Type 2 diabetes mellitus with diabetic peripheral angiopathy with gangrene: Secondary | ICD-10-CM | POA: Diagnosis not present

## 2018-06-04 DIAGNOSIS — Z4781 Encounter for orthopedic aftercare following surgical amputation: Secondary | ICD-10-CM | POA: Diagnosis not present

## 2018-06-04 DIAGNOSIS — I130 Hypertensive heart and chronic kidney disease with heart failure and stage 1 through stage 4 chronic kidney disease, or unspecified chronic kidney disease: Secondary | ICD-10-CM | POA: Diagnosis not present

## 2018-06-05 ENCOUNTER — Other Ambulatory Visit: Payer: Self-pay | Admitting: Nurse Practitioner

## 2018-06-05 DIAGNOSIS — I130 Hypertensive heart and chronic kidney disease with heart failure and stage 1 through stage 4 chronic kidney disease, or unspecified chronic kidney disease: Secondary | ICD-10-CM | POA: Diagnosis not present

## 2018-06-05 DIAGNOSIS — D62 Acute posthemorrhagic anemia: Secondary | ICD-10-CM | POA: Diagnosis not present

## 2018-06-05 DIAGNOSIS — M86171 Other acute osteomyelitis, right ankle and foot: Secondary | ICD-10-CM | POA: Diagnosis not present

## 2018-06-05 DIAGNOSIS — N179 Acute kidney failure, unspecified: Secondary | ICD-10-CM | POA: Diagnosis not present

## 2018-06-05 DIAGNOSIS — E1152 Type 2 diabetes mellitus with diabetic peripheral angiopathy with gangrene: Secondary | ICD-10-CM | POA: Diagnosis not present

## 2018-06-05 DIAGNOSIS — Z4781 Encounter for orthopedic aftercare following surgical amputation: Secondary | ICD-10-CM | POA: Diagnosis not present

## 2018-06-05 IMAGING — CR DG CERVICAL SPINE 2 OR 3 VIEWS
3 series · 3 of 3 positions shown · non-contrast
Comparison: CT 11/10/2015.

CLINICAL DATA: Fall.  Neck pain.

EXAM:
CERVICAL SPINE - 2-3 VIEW

[w c-spine lat]
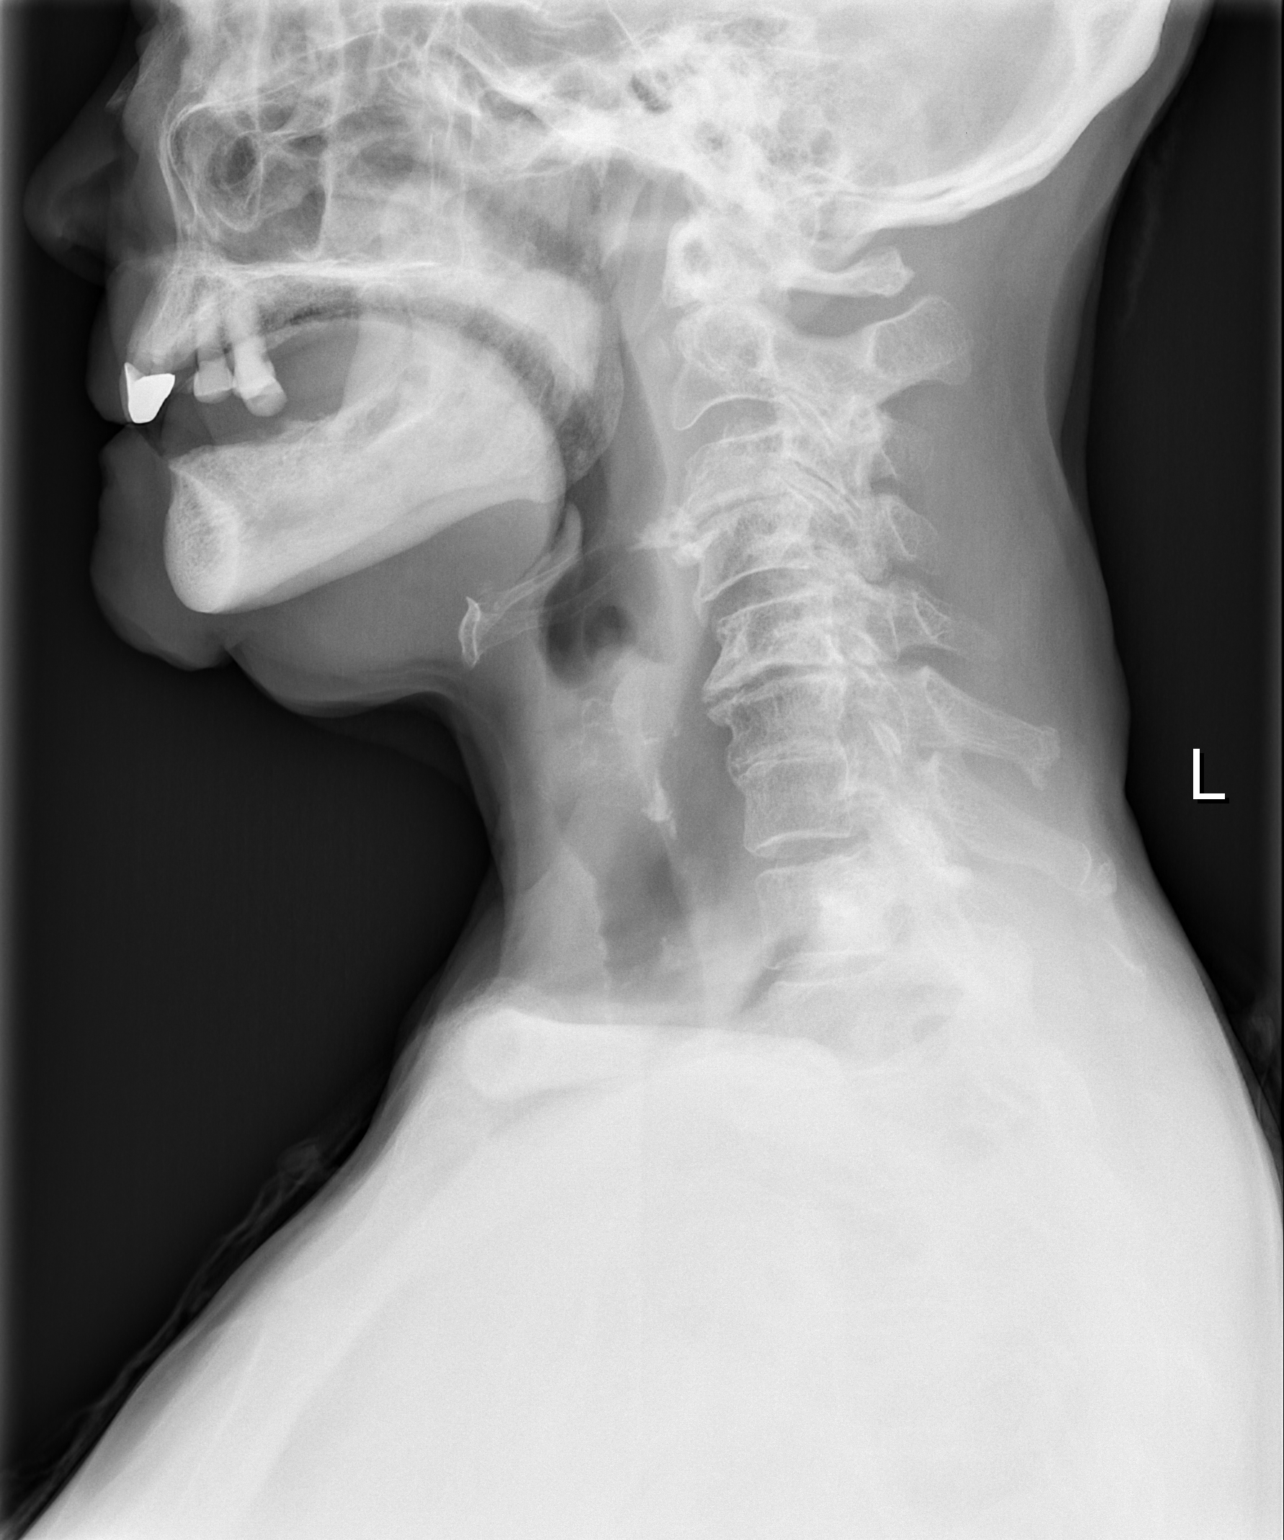

[w c-spine a.p. *]
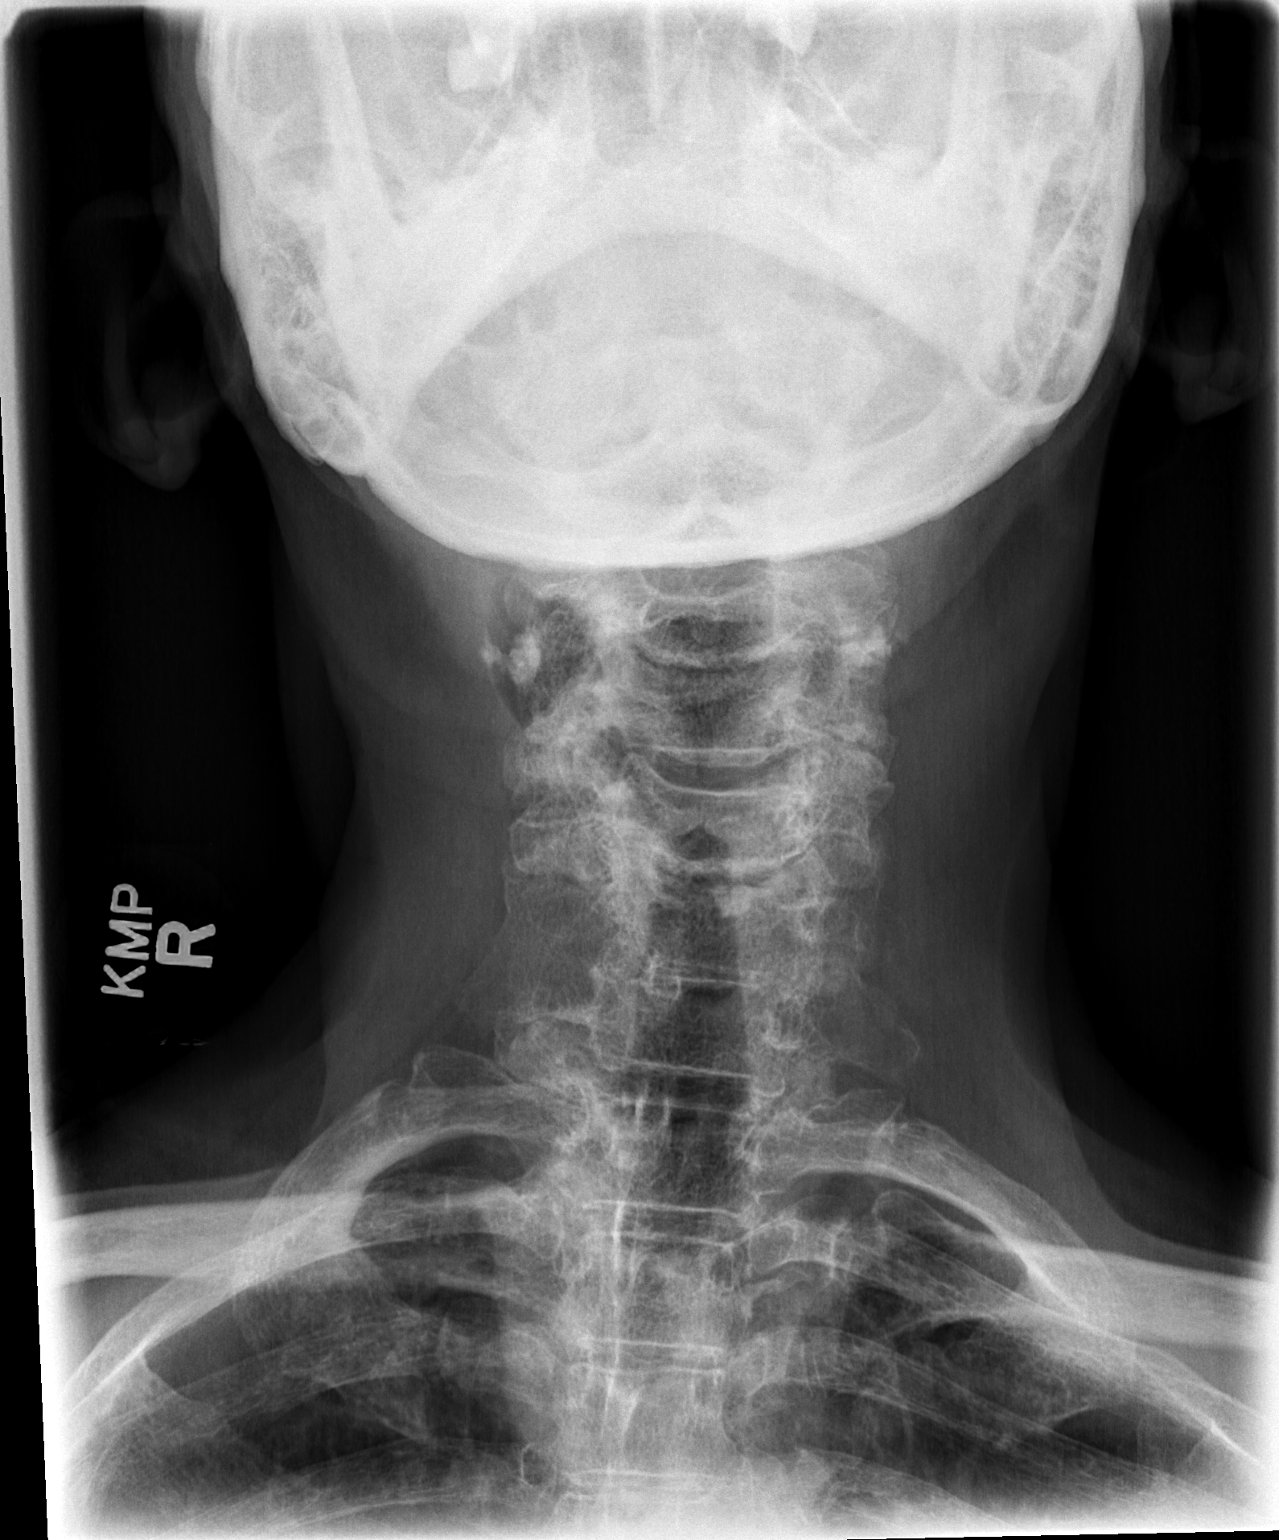

[w c-spine odontoid *]
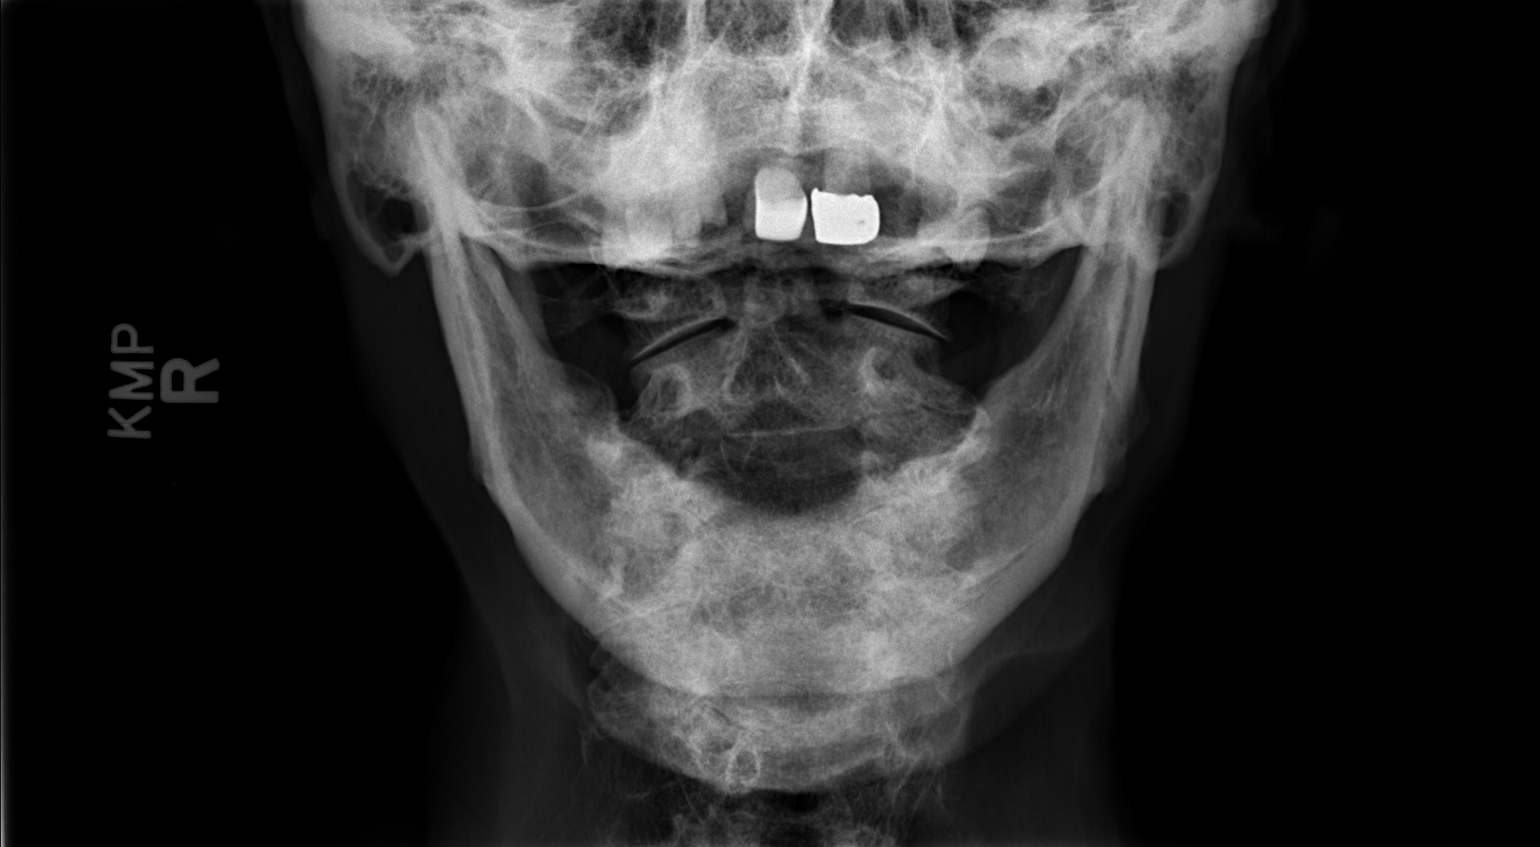

[3 of 3 positions shown; findings below may reference images not displayed]

FINDINGS: Diffuse multilevel degenerative change. Degenerative changes most
severe at C3-C4 on C5-C6. No acute abnormality identified. No
evidence of fracture or dislocation. Bilateral carotid vascular
calcification. Pulmonary apices are clear.
IMPRESSION: 1.  Diffuse multilevel degenerative change.

2.  Bilateral carotid vascular disease .

## 2018-06-05 IMAGING — CR DG ABDOMEN 2V
2 series · 2 of 2 positions shown · non-contrast
Comparison: Abdominal radiograph and CT abdomen and pelvis
11/06/2015

CLINICAL DATA: Abdominal bloating and pain near GJ tube for a few
weeks.

EXAM:
ABDOMEN - 2 VIEW

[w abdomen upright *]
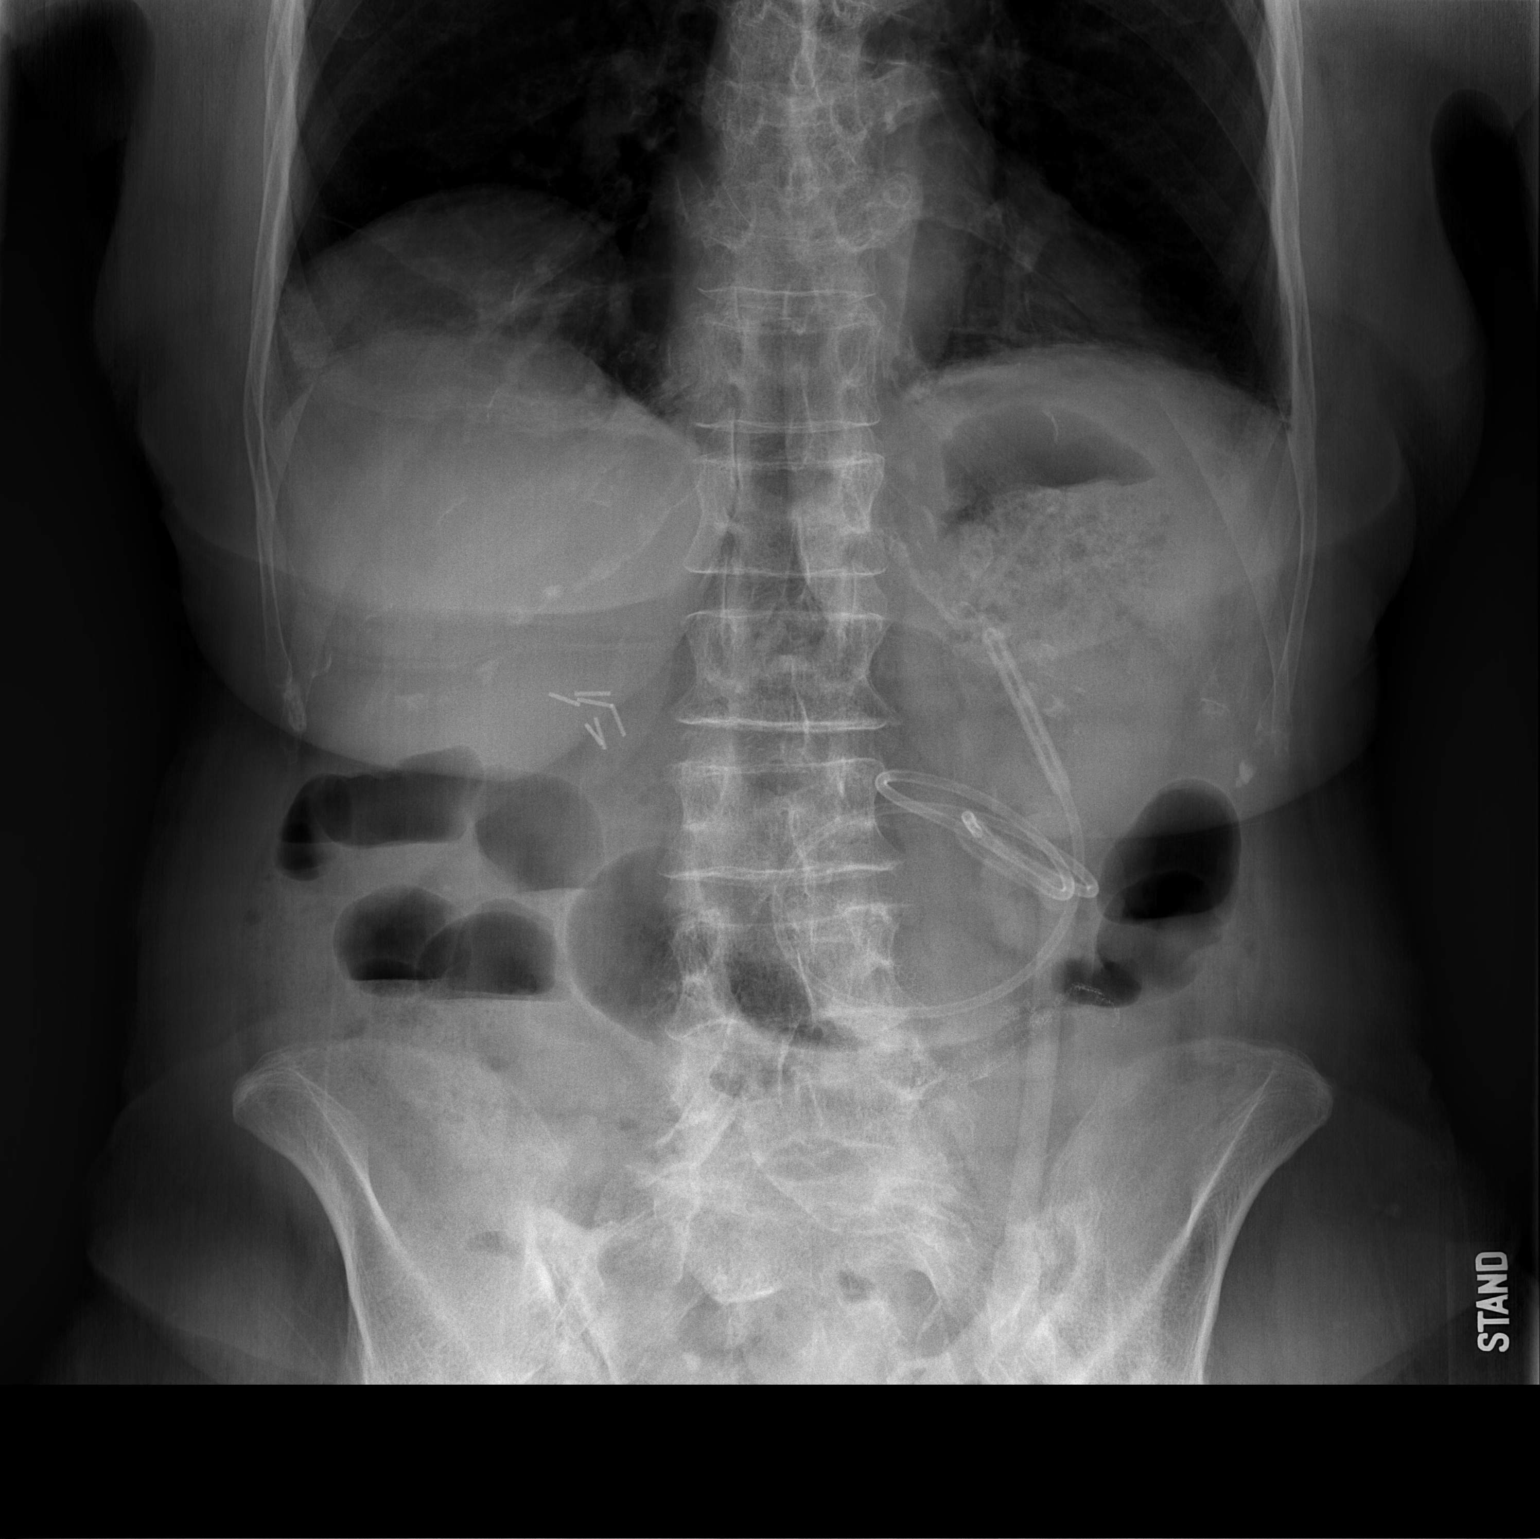

[t abdomen supine]
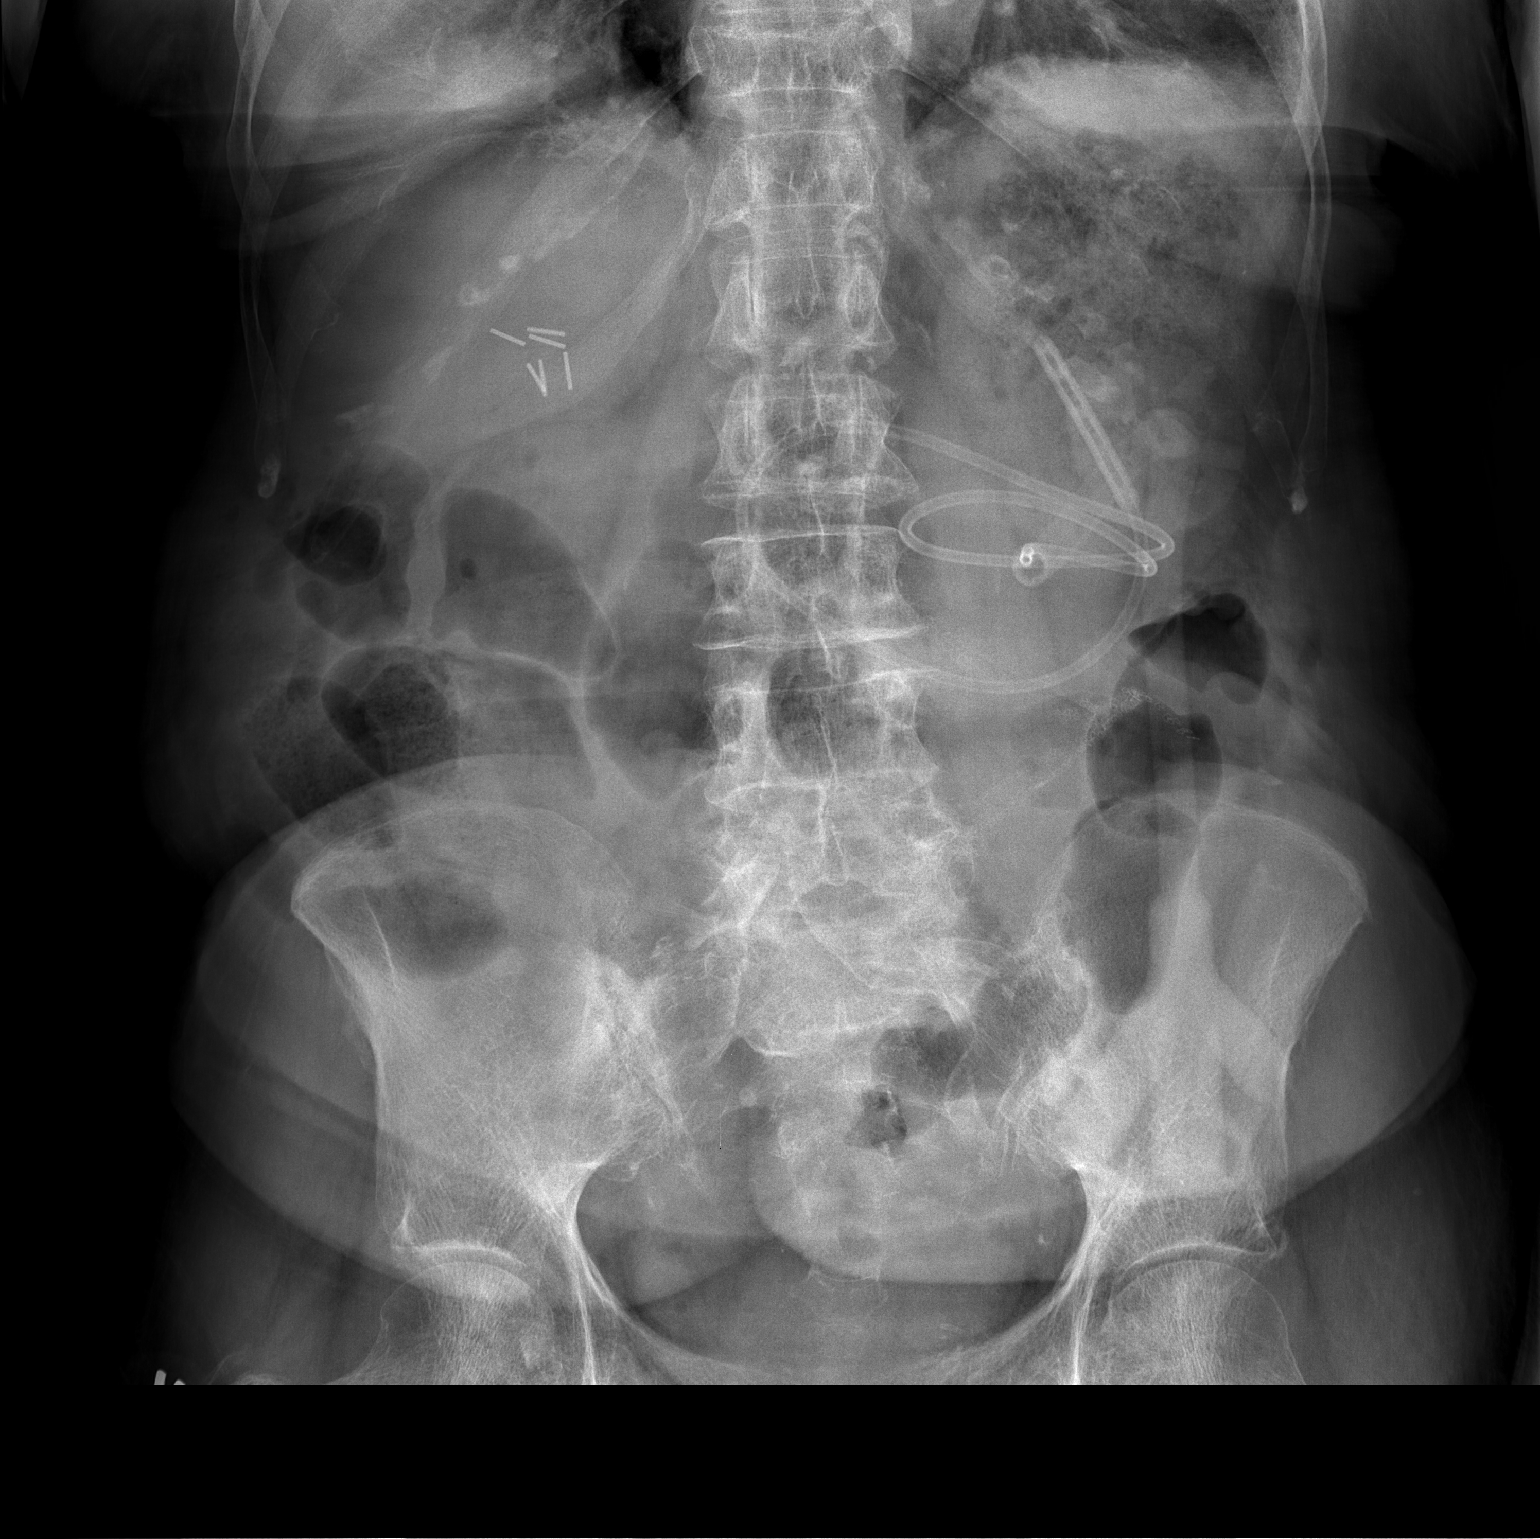

[2 of 2 positions shown; findings below may reference images not displayed]

FINDINGS: The gastrojejunostomy tube is coiled in the stomach with tip at the
level of the proximal gastric body. The tube extended into the
duodenum on the prior CT. Gas is present in nondilated colon. No
dilated loops of bowel are seen to suggest obstruction. There is no
evidence of intraperitoneal free air. Right upper quadrant surgical
clips are present, and a bowel anastomosis is present in the left
mid abdomen. The visualized lung bases are grossly clear. No acute
osseous abnormality is identified.
IMPRESSION: 1. GJ tube coiled in the stomach. The tip was in the duodenum on the
prior CT.
2. No evidence of bowel obstruction.

## 2018-06-06 DIAGNOSIS — N179 Acute kidney failure, unspecified: Secondary | ICD-10-CM | POA: Diagnosis not present

## 2018-06-06 DIAGNOSIS — I130 Hypertensive heart and chronic kidney disease with heart failure and stage 1 through stage 4 chronic kidney disease, or unspecified chronic kidney disease: Secondary | ICD-10-CM | POA: Diagnosis not present

## 2018-06-06 DIAGNOSIS — M86171 Other acute osteomyelitis, right ankle and foot: Secondary | ICD-10-CM | POA: Diagnosis not present

## 2018-06-06 DIAGNOSIS — Z4781 Encounter for orthopedic aftercare following surgical amputation: Secondary | ICD-10-CM | POA: Diagnosis not present

## 2018-06-06 DIAGNOSIS — E1152 Type 2 diabetes mellitus with diabetic peripheral angiopathy with gangrene: Secondary | ICD-10-CM | POA: Diagnosis not present

## 2018-06-06 DIAGNOSIS — D62 Acute posthemorrhagic anemia: Secondary | ICD-10-CM | POA: Diagnosis not present

## 2018-06-10 ENCOUNTER — Telehealth: Payer: Self-pay | Admitting: *Deleted

## 2018-06-10 DIAGNOSIS — I4891 Unspecified atrial fibrillation: Secondary | ICD-10-CM | POA: Diagnosis not present

## 2018-06-10 DIAGNOSIS — E1142 Type 2 diabetes mellitus with diabetic polyneuropathy: Secondary | ICD-10-CM | POA: Diagnosis not present

## 2018-06-10 DIAGNOSIS — D62 Acute posthemorrhagic anemia: Secondary | ICD-10-CM | POA: Diagnosis not present

## 2018-06-10 DIAGNOSIS — N183 Chronic kidney disease, stage 3 (moderate): Secondary | ICD-10-CM | POA: Diagnosis not present

## 2018-06-10 DIAGNOSIS — K3184 Gastroparesis: Secondary | ICD-10-CM | POA: Diagnosis not present

## 2018-06-10 DIAGNOSIS — I6529 Occlusion and stenosis of unspecified carotid artery: Secondary | ICD-10-CM | POA: Diagnosis not present

## 2018-06-10 DIAGNOSIS — I5032 Chronic diastolic (congestive) heart failure: Secondary | ICD-10-CM | POA: Diagnosis not present

## 2018-06-10 DIAGNOSIS — I4892 Unspecified atrial flutter: Secondary | ICD-10-CM | POA: Diagnosis not present

## 2018-06-10 DIAGNOSIS — E1122 Type 2 diabetes mellitus with diabetic chronic kidney disease: Secondary | ICD-10-CM | POA: Diagnosis not present

## 2018-06-10 DIAGNOSIS — E1143 Type 2 diabetes mellitus with diabetic autonomic (poly)neuropathy: Secondary | ICD-10-CM | POA: Diagnosis not present

## 2018-06-10 DIAGNOSIS — F329 Major depressive disorder, single episode, unspecified: Secondary | ICD-10-CM | POA: Diagnosis not present

## 2018-06-10 DIAGNOSIS — E1152 Type 2 diabetes mellitus with diabetic peripheral angiopathy with gangrene: Secondary | ICD-10-CM | POA: Diagnosis not present

## 2018-06-10 DIAGNOSIS — D6862 Lupus anticoagulant syndrome: Secondary | ICD-10-CM | POA: Diagnosis not present

## 2018-06-10 DIAGNOSIS — M86171 Other acute osteomyelitis, right ankle and foot: Secondary | ICD-10-CM | POA: Diagnosis not present

## 2018-06-10 DIAGNOSIS — I130 Hypertensive heart and chronic kidney disease with heart failure and stage 1 through stage 4 chronic kidney disease, or unspecified chronic kidney disease: Secondary | ICD-10-CM | POA: Diagnosis not present

## 2018-06-10 DIAGNOSIS — K311 Adult hypertrophic pyloric stenosis: Secondary | ICD-10-CM | POA: Diagnosis not present

## 2018-06-10 DIAGNOSIS — J449 Chronic obstructive pulmonary disease, unspecified: Secondary | ICD-10-CM | POA: Diagnosis not present

## 2018-06-10 DIAGNOSIS — N179 Acute kidney failure, unspecified: Secondary | ICD-10-CM | POA: Diagnosis not present

## 2018-06-10 DIAGNOSIS — J9621 Acute and chronic respiratory failure with hypoxia: Secondary | ICD-10-CM | POA: Diagnosis not present

## 2018-06-10 DIAGNOSIS — E43 Unspecified severe protein-calorie malnutrition: Secondary | ICD-10-CM | POA: Diagnosis not present

## 2018-06-10 DIAGNOSIS — Z4781 Encounter for orthopedic aftercare following surgical amputation: Secondary | ICD-10-CM | POA: Diagnosis not present

## 2018-06-10 DIAGNOSIS — I2510 Atherosclerotic heart disease of native coronary artery without angina pectoris: Secondary | ICD-10-CM | POA: Diagnosis not present

## 2018-06-10 DIAGNOSIS — E039 Hypothyroidism, unspecified: Secondary | ICD-10-CM | POA: Diagnosis not present

## 2018-06-10 DIAGNOSIS — R1310 Dysphagia, unspecified: Secondary | ICD-10-CM | POA: Diagnosis not present

## 2018-06-10 DIAGNOSIS — D631 Anemia in chronic kidney disease: Secondary | ICD-10-CM | POA: Diagnosis not present

## 2018-06-10 NOTE — Telephone Encounter (Signed)
Mel with [facility] called requesting verbal orders to continue PT 1x2wks.  Verbal orders given.

## 2018-06-13 DIAGNOSIS — E1152 Type 2 diabetes mellitus with diabetic peripheral angiopathy with gangrene: Secondary | ICD-10-CM | POA: Diagnosis not present

## 2018-06-13 DIAGNOSIS — N179 Acute kidney failure, unspecified: Secondary | ICD-10-CM | POA: Diagnosis not present

## 2018-06-13 DIAGNOSIS — M86171 Other acute osteomyelitis, right ankle and foot: Secondary | ICD-10-CM | POA: Diagnosis not present

## 2018-06-13 DIAGNOSIS — Z4781 Encounter for orthopedic aftercare following surgical amputation: Secondary | ICD-10-CM | POA: Diagnosis not present

## 2018-06-13 DIAGNOSIS — I130 Hypertensive heart and chronic kidney disease with heart failure and stage 1 through stage 4 chronic kidney disease, or unspecified chronic kidney disease: Secondary | ICD-10-CM | POA: Diagnosis not present

## 2018-06-13 DIAGNOSIS — D62 Acute posthemorrhagic anemia: Secondary | ICD-10-CM | POA: Diagnosis not present

## 2018-06-14 ENCOUNTER — Other Ambulatory Visit: Payer: Self-pay | Admitting: *Deleted

## 2018-06-14 MED ORDER — OXYCODONE HCL 5 MG PO TABS: 5.0000 mg | ORAL_TABLET | ORAL | 0 refills | Status: DC | PRN

## 2018-06-14 NOTE — Telephone Encounter (Signed)
Daughter calling asking for refill of Percocet, last filled 2018 for #30. Database checked and verified

## 2018-06-17 DIAGNOSIS — I130 Hypertensive heart and chronic kidney disease with heart failure and stage 1 through stage 4 chronic kidney disease, or unspecified chronic kidney disease: Secondary | ICD-10-CM | POA: Diagnosis not present

## 2018-06-17 DIAGNOSIS — M86171 Other acute osteomyelitis, right ankle and foot: Secondary | ICD-10-CM | POA: Diagnosis not present

## 2018-06-17 DIAGNOSIS — Z4781 Encounter for orthopedic aftercare following surgical amputation: Secondary | ICD-10-CM | POA: Diagnosis not present

## 2018-06-17 DIAGNOSIS — E1152 Type 2 diabetes mellitus with diabetic peripheral angiopathy with gangrene: Secondary | ICD-10-CM | POA: Diagnosis not present

## 2018-06-17 DIAGNOSIS — D62 Acute posthemorrhagic anemia: Secondary | ICD-10-CM | POA: Diagnosis not present

## 2018-06-17 DIAGNOSIS — N179 Acute kidney failure, unspecified: Secondary | ICD-10-CM | POA: Diagnosis not present

## 2018-06-18 ENCOUNTER — Other Ambulatory Visit (HOSPITAL_COMMUNITY): Payer: Self-pay | Admitting: Radiology

## 2018-06-18 DIAGNOSIS — R633 Feeding difficulties, unspecified: Secondary | ICD-10-CM

## 2018-06-19 ENCOUNTER — Ambulatory Visit (HOSPITAL_COMMUNITY)
Admission: RE | Admit: 2018-06-19 | Discharge: 2018-06-19 | Disposition: A | Discharge status: Home / Self Care | Payer: Medicare Other | Source: Ambulatory Visit | Attending: Radiology | Admitting: Radiology

## 2018-06-19 ENCOUNTER — Encounter (HOSPITAL_COMMUNITY): Payer: Self-pay | Admitting: Interventional Radiology

## 2018-06-19 ENCOUNTER — Other Ambulatory Visit: Payer: Self-pay

## 2018-06-19 DIAGNOSIS — R1319 Other dysphagia: Secondary | ICD-10-CM | POA: Diagnosis not present

## 2018-06-19 DIAGNOSIS — Z431 Encounter for attention to gastrostomy: Secondary | ICD-10-CM | POA: Diagnosis not present

## 2018-06-19 DIAGNOSIS — R633 Feeding difficulties, unspecified: Secondary | ICD-10-CM

## 2018-06-19 DIAGNOSIS — K9423 Gastrostomy malfunction: Secondary | ICD-10-CM | POA: Diagnosis not present

## 2018-06-19 HISTORY — PX: IR GJ TUBE CHANGE: IMG1440

## 2018-06-19 MED ORDER — IOHEXOL 300 MG/ML  SOLN
50.0000 mL | Freq: Once | INTRAMUSCULAR | Status: AC | PRN
  Administered 2018-06-19: 20 mL

## 2018-06-19 NOTE — Procedures (Signed)
Interventional Radiology Procedure Note  Procedure: Exchange of G-J tube, with new 70F GJ, 45cm, with Enfit adaptors. Complications: None Recommendations:  - Ok to use - flush well after each use to avoid clogging - Routine care   Signed,  Dulcy Fanny. Earleen Newport, DO

## 2018-06-20 DIAGNOSIS — N179 Acute kidney failure, unspecified: Secondary | ICD-10-CM | POA: Diagnosis not present

## 2018-06-20 DIAGNOSIS — I130 Hypertensive heart and chronic kidney disease with heart failure and stage 1 through stage 4 chronic kidney disease, or unspecified chronic kidney disease: Secondary | ICD-10-CM | POA: Diagnosis not present

## 2018-06-20 DIAGNOSIS — M86171 Other acute osteomyelitis, right ankle and foot: Secondary | ICD-10-CM | POA: Diagnosis not present

## 2018-06-20 DIAGNOSIS — E1152 Type 2 diabetes mellitus with diabetic peripheral angiopathy with gangrene: Secondary | ICD-10-CM | POA: Diagnosis not present

## 2018-06-20 DIAGNOSIS — D62 Acute posthemorrhagic anemia: Secondary | ICD-10-CM | POA: Diagnosis not present

## 2018-06-20 DIAGNOSIS — Z4781 Encounter for orthopedic aftercare following surgical amputation: Secondary | ICD-10-CM | POA: Diagnosis not present

## 2018-06-24 ENCOUNTER — Telehealth: Payer: Self-pay | Admitting: Vascular Surgery

## 2018-06-24 DIAGNOSIS — M86171 Other acute osteomyelitis, right ankle and foot: Secondary | ICD-10-CM | POA: Diagnosis not present

## 2018-06-24 DIAGNOSIS — Z4781 Encounter for orthopedic aftercare following surgical amputation: Secondary | ICD-10-CM | POA: Diagnosis not present

## 2018-06-24 DIAGNOSIS — D62 Acute posthemorrhagic anemia: Secondary | ICD-10-CM | POA: Diagnosis not present

## 2018-06-24 DIAGNOSIS — E1152 Type 2 diabetes mellitus with diabetic peripheral angiopathy with gangrene: Secondary | ICD-10-CM | POA: Diagnosis not present

## 2018-06-24 DIAGNOSIS — N179 Acute kidney failure, unspecified: Secondary | ICD-10-CM | POA: Diagnosis not present

## 2018-06-24 DIAGNOSIS — I130 Hypertensive heart and chronic kidney disease with heart failure and stage 1 through stage 4 chronic kidney disease, or unspecified chronic kidney disease: Secondary | ICD-10-CM | POA: Diagnosis not present

## 2018-06-24 NOTE — Telephone Encounter (Signed)
The above patient or their representative was contacted and gave the following answers to these questions:         Do you have any of the following symptoms?NO  Fever                    Cough                   Shortness of breath  Do  you have any of the following other symptoms? NO   muscle pain         vomiting,        diarrhea        rash         weakness        red eye        abdominal pain         bruising          bruising or bleeding              joint pain           severe headache    Have you been in contact with someone who was or has been sick in the past 2 weeks? NO  Yes                 Unsure                         Unable to assess   Does the person that you were in contact with have any of the following symptoms? NO  Cough         shortness of breath           muscle pain         vomiting,            diarrhea            rash            weakness           fever            red eye           abdominal pain           bruising  or  bleeding                joint pain                severe headache               Have you  or someone you have been in contact with traveled internationally in th last month?         If yes, which countries?   Have you  or someone you have been in contact with traveled outside [place] in th last month?         If yes, which state and city?   COMMENTS OR ACTION PLAN FOR THIS PATIENT:

## 2018-06-24 NOTE — Telephone Encounter (Signed)
The above patient or their representative was contacted and gave the following answers to these questions:         Do you have any of the following symptoms? NO  Fever                    Cough                   Shortness of breath  Do  you have any of the following other symptoms? No   muscle pain         vomiting,        diarrhea        rash         weakness        red eye        abdominal pain         bruising          bruising or bleeding              joint pain           severe headache    Have you been in contact with someone who was or has been sick in the past 2 weeks? No  Yes                 Unsure                         Unable to assess   Does the person that you were in contact with have any of the following symptoms? NO  Cough         shortness of breath           muscle pain         vomiting,            diarrhea            rash            weakness           fever            red eye           abdominal pain           bruising  or  bleeding                joint pain                severe headache               Have you  or someone you have been in contact with traveled internationally in th last month?  No       If yes, which countries?   Have you  or someone you have been in contact with traveled outside [place] in th last month? No        If yes, which state and city?   COMMENTS OR ACTION PLAN FOR THIS PATIENT:

## 2018-06-25 ENCOUNTER — Other Ambulatory Visit: Payer: Self-pay

## 2018-06-25 ENCOUNTER — Encounter: Payer: Self-pay | Admitting: Vascular Surgery

## 2018-06-25 ENCOUNTER — Ambulatory Visit: Payer: Medicare Other | Admitting: Vascular Surgery

## 2018-06-25 ENCOUNTER — Ambulatory Visit (INDEPENDENT_AMBULATORY_CARE_PROVIDER_SITE_OTHER): Payer: Medicare Other | Admitting: Vascular Surgery

## 2018-06-25 VITALS — BP 195/74 | HR 61 | Temp 97.3°F | Resp 16 | Ht 66.0 in | Wt 148.0 lb

## 2018-06-25 DIAGNOSIS — I739 Peripheral vascular disease, unspecified: Secondary | ICD-10-CM

## 2018-06-25 NOTE — Progress Notes (Signed)
Patient name: Shannon [name] MRN: [id number] DOB: [birth year] Sex: female  REASON FOR VISIT: Follow-up ray amputation right great toe  HPI: Shannon [name] is a 85 y.o. female here today for continued follow-up.  She has had a complete healing of her toe.  Her nylon sutures were removed.    Current Outpatient Medications  Medication Sig Dispense Refill  . acetaminophen (TYLENOL) 500 MG tablet Take 500 mg by mouth every 6 (six) hours as needed (pain).     . Amino Acids-Protein Hydrolys (FEEDING SUPPLEMENT, PRO-STAT SUGAR FREE 64,) LIQD Take 30 mLs by mouth 2 (two) times daily.    [name] atorvastatin (LIPITOR) 10 MG tablet TAKE 1 TABLET BY MOUTH EVERY DAY 90 tablet 2  . b complex vitamins tablet Take 1 tablet by mouth daily.     . calcium-vitamin D (OSCAL WITH D) 500-200 MG-UNIT per tablet Take 1 tablet by mouth daily with breakfast.     . Carboxymethylcellulose Sodium (ARTIFICIAL TEARS OP) Place 1 drop into both eyes 4 (four) times daily.    . carvedilol (COREG) 6.25 MG tablet Take 1 tablet (6.25 mg total) by mouth 2 (two) times daily. 180 tablet 1  . collagenase (SANTYL) ointment Apply 1 application topically daily as needed. For wound care    . cycloSPORINE (RESTASIS) 0.05 % ophthalmic emulsion USE 1 DROP INTO BOTH EYES TWICE DAILY 60 mL 3  . feeding supplement (OSMOLITE 1 CAL) LIQD Take 711 mLs by mouth See admin instructions. For 12 hours 2100 - 0900    . ferrous sulfate 325 (65 FE) MG tablet Take 1 tablet (325 mg total) by mouth daily. 90 tablet 1  . fluticasone (FLONASE) 50 MCG/ACT nasal spray Spray 2 sprays into each nostril once daily 16 g 3  . furosemide (LASIX) 40 MG tablet TAKE 1 TABLET BY MOUTH EVERY DAY 90 tablet 1  . gabapentin (NEURONTIN) 300 MG capsule Take 1 capsule (300 mg total) by mouth 2 (two) times daily.    [name] gentamicin cream (GARAMYCIN) 0.1 % Apply 1 application topically 3 (three) times daily. 30 g 1  . hydrALAZINE (APRESOLINE) 50 MG tablet  Take 50 mg by mouth 3 (three) times daily.    . insulin aspart (NOVOLOG) 100 UNIT/ML FlexPen Inject 0-6 Units into the skin 3 (three) times daily with meals. New sliding Scale If BS is 151 - 251 = 2 units,  If BS is 251 -  350 = 4 units,  If BS is 351 - 450 = 6 units Call provider if BS runs more than 400    . insulin glargine (LANTUS) 100 UNIT/ML injection Inject 8 Units into the skin at bedtime.     . Insulin Pen Needle (B-D UF III MINI PEN NEEDLES) 31G X 5 MM MISC Use twice daily with giving insulin injections. Dx E11.9 300 each 3  . ipratropium-albuterol (DUONEB) 0.5-2.5 (3) MG/3ML SOLN Take 3 mLs by nebulization every 6 (six) hours as needed (sob). Use 3 times daily x 4 days then every 6 hours as needed. 360 mL 4  . isosorbide mononitrate (IMDUR) 30 MG 24 hr tablet TAKE 2 TABLETS (60 MG TOTAL) BY MOUTH DAILY. 180 tablet 1  . latanoprost (XALATAN) 0.005 % ophthalmic solution Place 1 drop into both eyes at bedtime. 2.5 mL 12  . levalbuterol (XOPENEX HFA) 45 MCG/ACT inhaler Inhale 2 puffs into the lungs every 6 (six) hours as needed for wheezing or shortness of breath. 1 Inhaler 12  . levothyroxine (SYNTHROID, LEVOTHROID)  125 MCG tablet Take 1 tablet (125 mcg total) by mouth daily. 90 tablet 3  . loratadine (CLARITIN) 10 MG tablet Take 10 mg by mouth daily as needed for allergies.    . mometasone-formoterol (DULERA) 200-5 MCG/ACT AERO TAKE TWO PUFFS BY MOUTH TWICE DAILY 13 Inhaler 1  . Multiple Vitamin (MULTIVITAMIN WITH MINERALS) TABS tablet Take 1 tablet by mouth daily.    [name] nystatin (MYCOSTATIN/NYSTOP) powder Apply 1 g topically as needed.    [name] oxyCODONE (ROXICODONE) 5 MG immediate release tablet Take 1 tablet (5 mg total) by mouth every 4 (four) hours as needed for severe pain. May take 1/2 tab q4hrs as needed for mild pain. 30 tablet 0  . OXYGEN Inhale 2 L into the lungs at bedtime.    . pantoprazole (PROTONIX) 40 MG tablet TAKE 1 TABLET BY MOUTH TWICE A DAY 180 tablet 1  . sertraline (ZOLOFT)  100 MG tablet TAKE 1 TABLET BY MOUTH EVERY DAY FOR DEPRESSION 90 tablet 1  . warfarin (COUMADIN) 2.5 MG tablet Take by mouth See admin instructions. Take 5 mg every evening except Friday take 2.5 mg in the evening.    . Water For Irrigation, Sterile (FREE WATER) SOLN Place 150 mLs into feeding tube 3 (three) times daily.     Cliffton Asters Petrolatum-Mineral Oil (SYSTANE NIGHTTIME) OINT Place 1 application into both eyes at bedtime.      No current facility-administered medications for this visit.      PHYSICAL EXAM: Vitals:   06/25/18 1022  BP: (!) 195/74  Pulse: 61  Resp: 16  Temp: (!) 97.3 F (36.3 C)  SpO2: 94%  Weight: 148 lb (67.1 kg)  Height: 5\' 6"  (1.676 m)    GENERAL: The patient is a well-nourished female, in no acute distress. The vital signs are documented above. Incision completely healed primarily.  Nylon sutures removed  MEDICAL ISSUES: We will discontinue her Darco shoe and begin wearing a regular shoe.  We will see [place] on an as-needed basis   Larina Earthly, MD [facility] Tel [phone] Pager [phone]

## 2018-06-26 IMAGING — CR DG CHEST 1V PORT
1 series · 1 of 1 positions shown · non-contrast
Comparison: 11/09/2015.

CLINICAL DATA: Shortness of breath.

EXAM:
PORTABLE CHEST 1 VIEW

[AP]
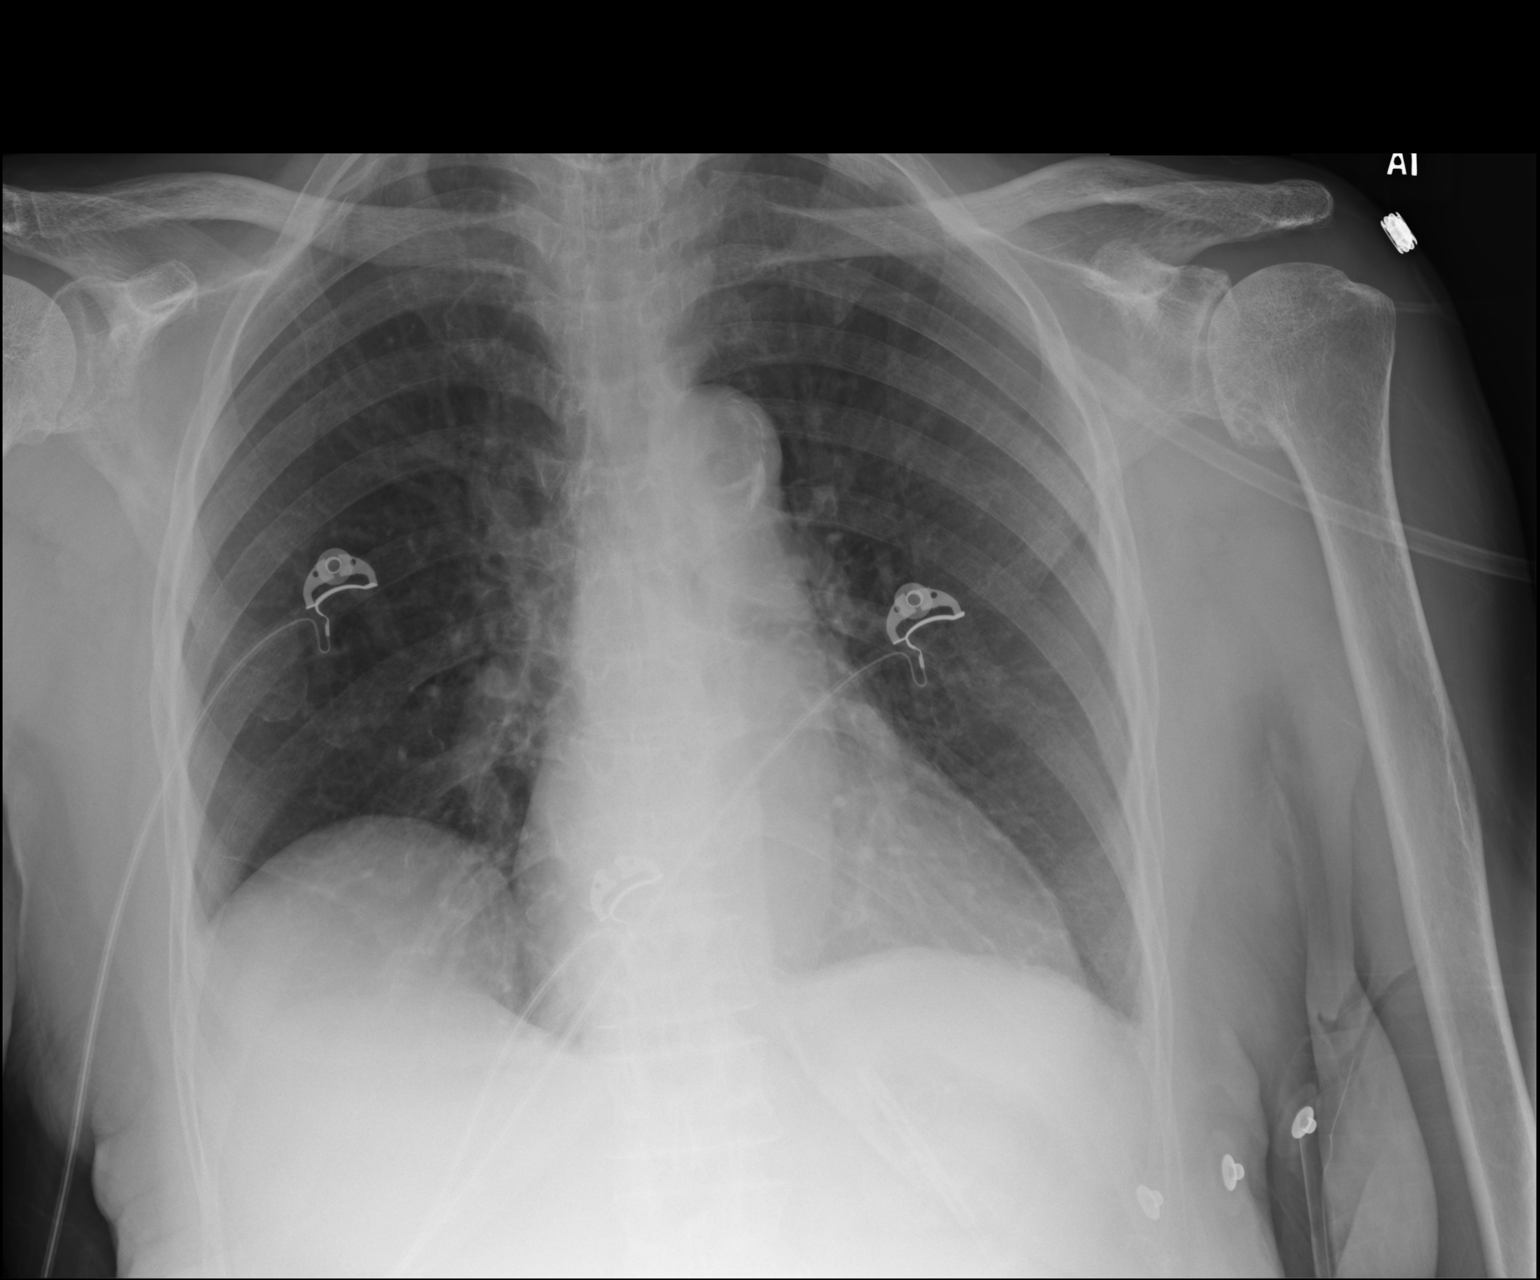

[1 of 1 positions shown; findings below may reference images not displayed]

FINDINGS: Trachea is midline. Heart size stable. Thoracic aorta is calcified.
Lungs are clear. No pleural fluid. Right hemidiaphragm is elevated.
Degenerative changes are seen in the glenohumeral joints
bilaterally.
IMPRESSION: No acute findings.

## 2018-06-27 DIAGNOSIS — E1152 Type 2 diabetes mellitus with diabetic peripheral angiopathy with gangrene: Secondary | ICD-10-CM | POA: Diagnosis not present

## 2018-06-27 DIAGNOSIS — D62 Acute posthemorrhagic anemia: Secondary | ICD-10-CM | POA: Diagnosis not present

## 2018-06-27 DIAGNOSIS — M86171 Other acute osteomyelitis, right ankle and foot: Secondary | ICD-10-CM | POA: Diagnosis not present

## 2018-06-27 DIAGNOSIS — I130 Hypertensive heart and chronic kidney disease with heart failure and stage 1 through stage 4 chronic kidney disease, or unspecified chronic kidney disease: Secondary | ICD-10-CM | POA: Diagnosis not present

## 2018-06-27 DIAGNOSIS — Z4781 Encounter for orthopedic aftercare following surgical amputation: Secondary | ICD-10-CM | POA: Diagnosis not present

## 2018-06-27 DIAGNOSIS — N179 Acute kidney failure, unspecified: Secondary | ICD-10-CM | POA: Diagnosis not present

## 2018-06-28 ENCOUNTER — Other Ambulatory Visit: Payer: Self-pay | Admitting: *Deleted

## 2018-06-28 DIAGNOSIS — I5032 Chronic diastolic (congestive) heart failure: Secondary | ICD-10-CM

## 2018-06-28 DIAGNOSIS — K922 Gastrointestinal hemorrhage, unspecified: Secondary | ICD-10-CM

## 2018-06-28 MED ORDER — CARVEDILOL 6.25 MG PO TABS: 6.2500 mg | ORAL_TABLET | Freq: Two times a day (BID) | ORAL | 1 refills | Status: DC

## 2018-06-28 MED ORDER — PANTOPRAZOLE SODIUM 40 MG PO TBEC: 40.0000 mg | DELAYED_RELEASE_TABLET | Freq: Two times a day (BID) | ORAL | 1 refills | Status: DC

## 2018-06-29 IMAGING — CT CT PARANASAL SINUSES LIMITED
3 of 5 series · 17 of 47 positions shown, 20 images · non-contrast
Comparison: Head CT 11/06/2015

CLINICAL DATA: Pain behind left eye and left year.

EXAM:
CT PARANASAL SINUS LIMITED WITHOUT CONTRAST
TECHNIQUE: Non-contiguous multidetector CT images of the paranasal sinuses were
obtained in a single plane without contrast.

[Series 202: sinus standard · axial · 0.35mm/px · z∈[+49,+133]mm · 12 of 50 slices shown, 15 images]
[im 4/50  brain]
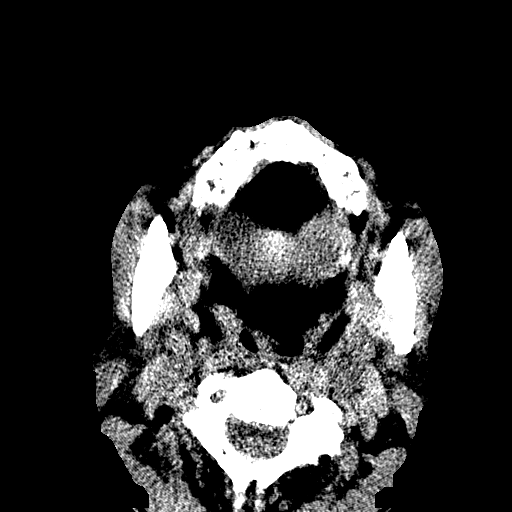
[im 4/50  bone]
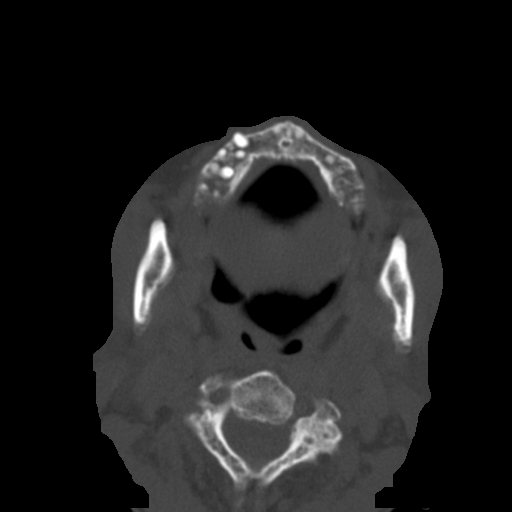
[im 7/50  bone]
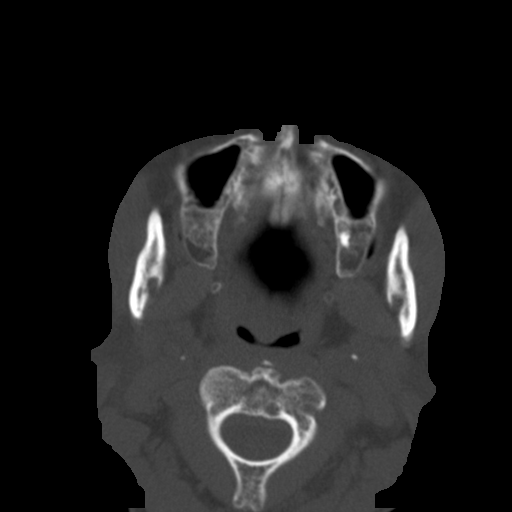
[im 11/50  bone]
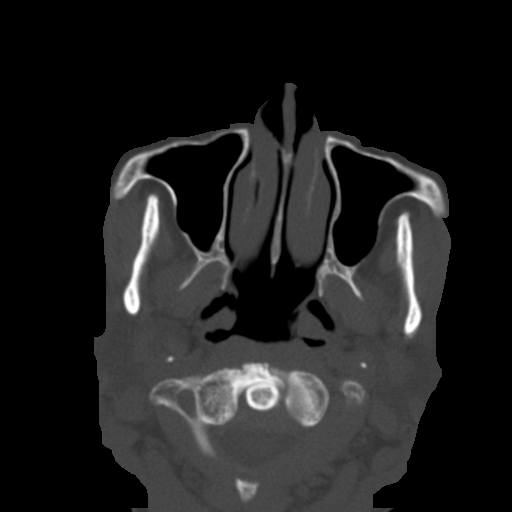
[im 16/50  bone]
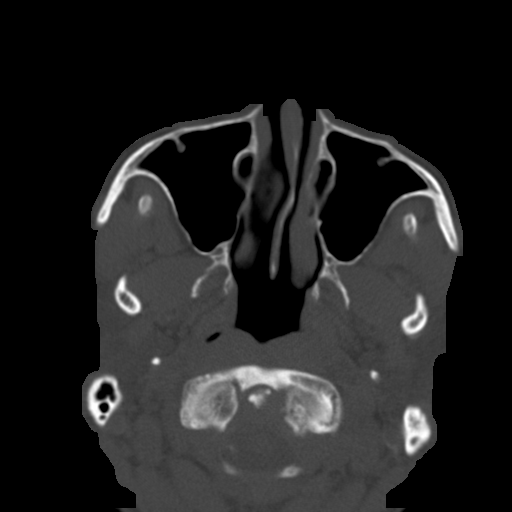
[im 19/50  brain]
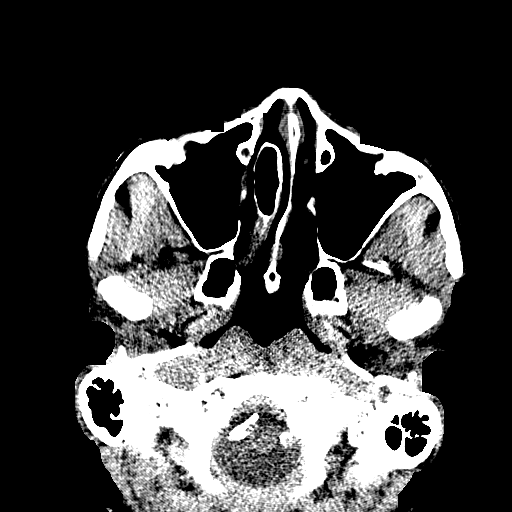
[im 19/50  bone]
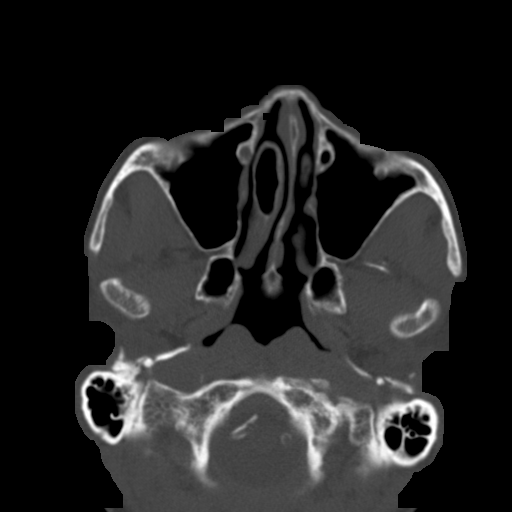
[im 22/50  bone]
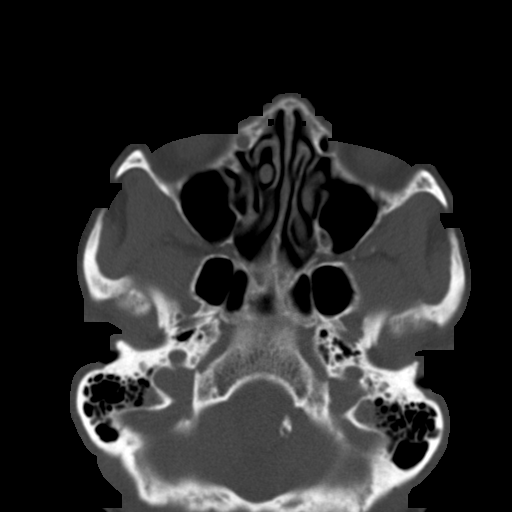
[im 28/50  bone]
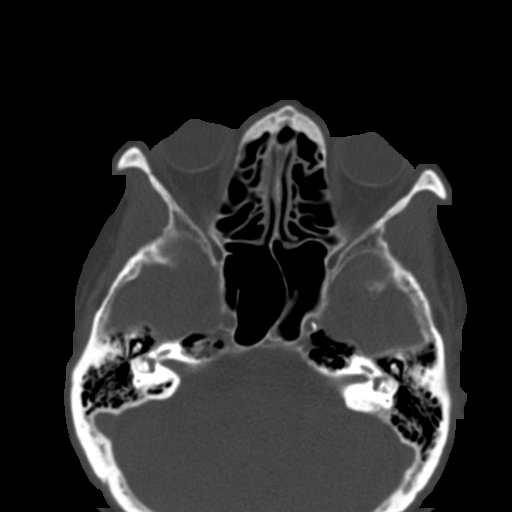
[im 31/50  bone]
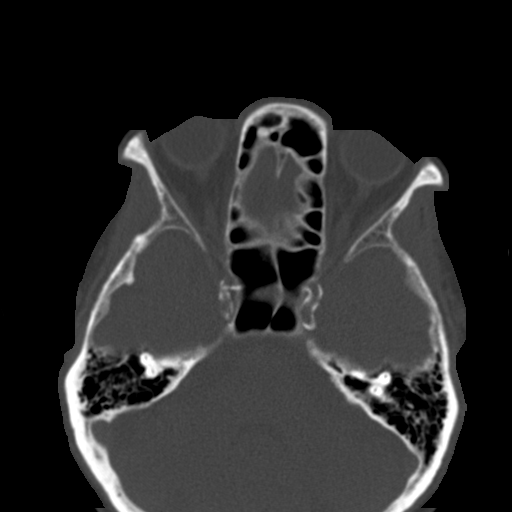
[im 34/50  brain]
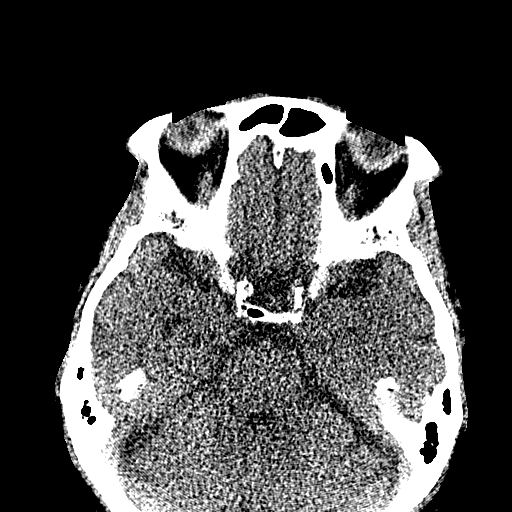
[im 34/50  bone]
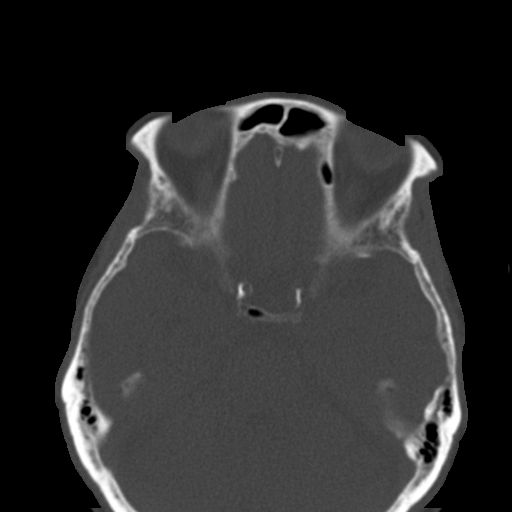
[im 39/50  bone]
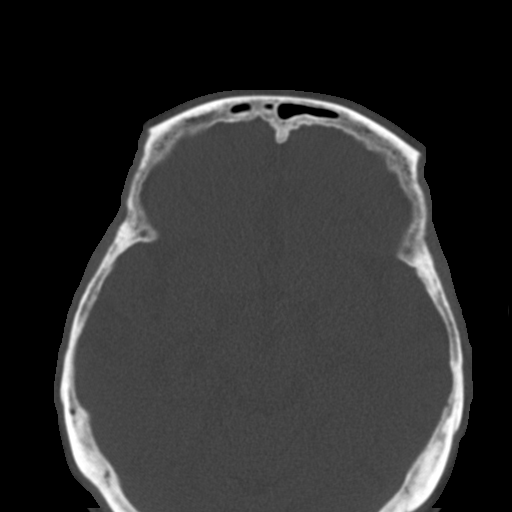
[im 43/50  bone]
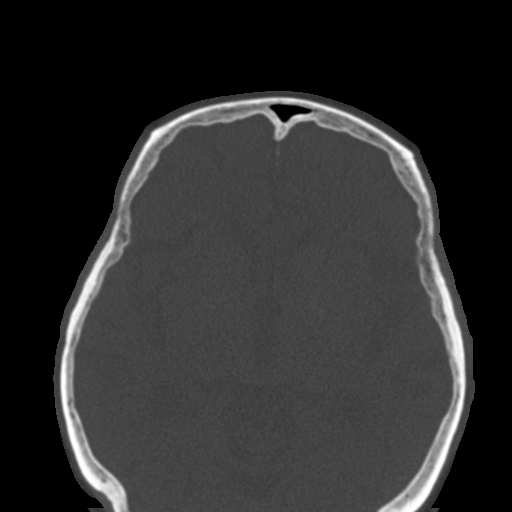
[im 46/50  bone]
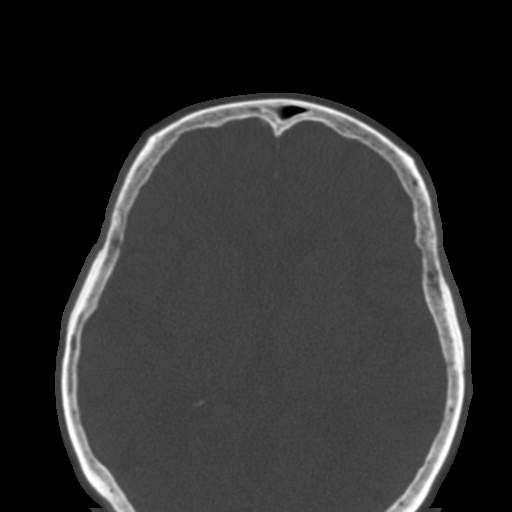

[Series 204: coronal bone · coronal · 0.34mm/px · 3 of 77 slices shown]
[im 20/77  bone]
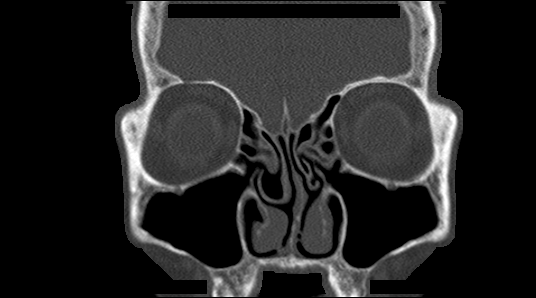
[im 39/77  bone]
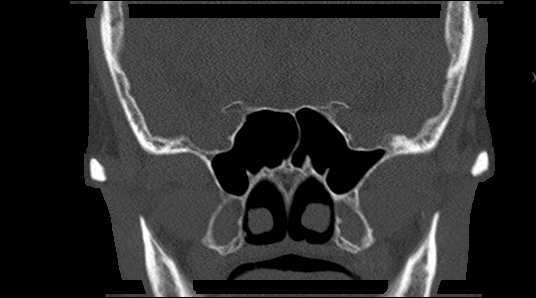
[im 58/77  bone]
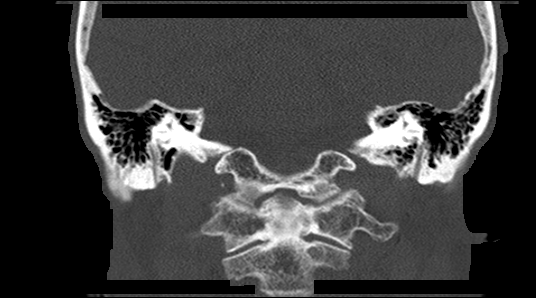

[Series 205: sagittal bone · sagittal · 0.34mm/px · 2 of 86 slices shown]
[im 29/86  bone]
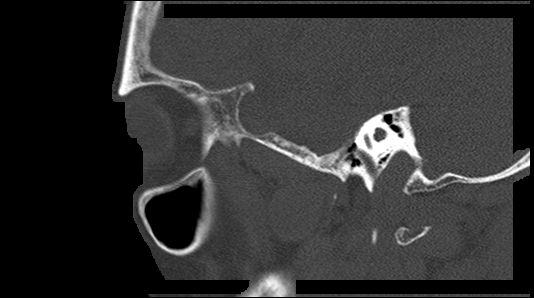
[im 57/86  bone]
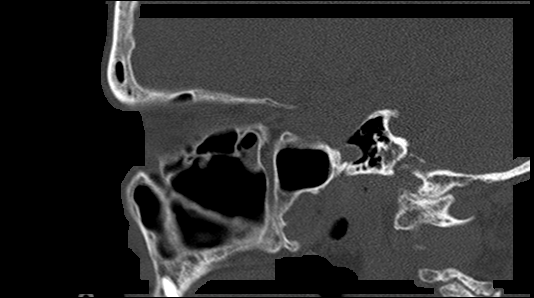

[17 of 47 positions shown; findings below may reference images not displayed]

FINDINGS: There is no paranasal sinus mucosal thickening. The sinus drainage
pathways are patent. There is a right concha bullosa.

Unremarkable visualized intracranial structures. Atherosclerotic
calcification within the proximal intracranial internal carotid
arteries and vertebral arteries. Normal appearance of the orbits.
IMPRESSION: No evidence of acute or chronic sinusitis.

## 2018-06-30 IMAGING — CR DG CHEST 1V PORT
1 series · 1 of 1 positions shown · non-contrast
Comparison: 01/24/2016

CLINICAL DATA: Wheezing and short of breath

EXAM:
PORTABLE CHEST 1 VIEW

[AP]
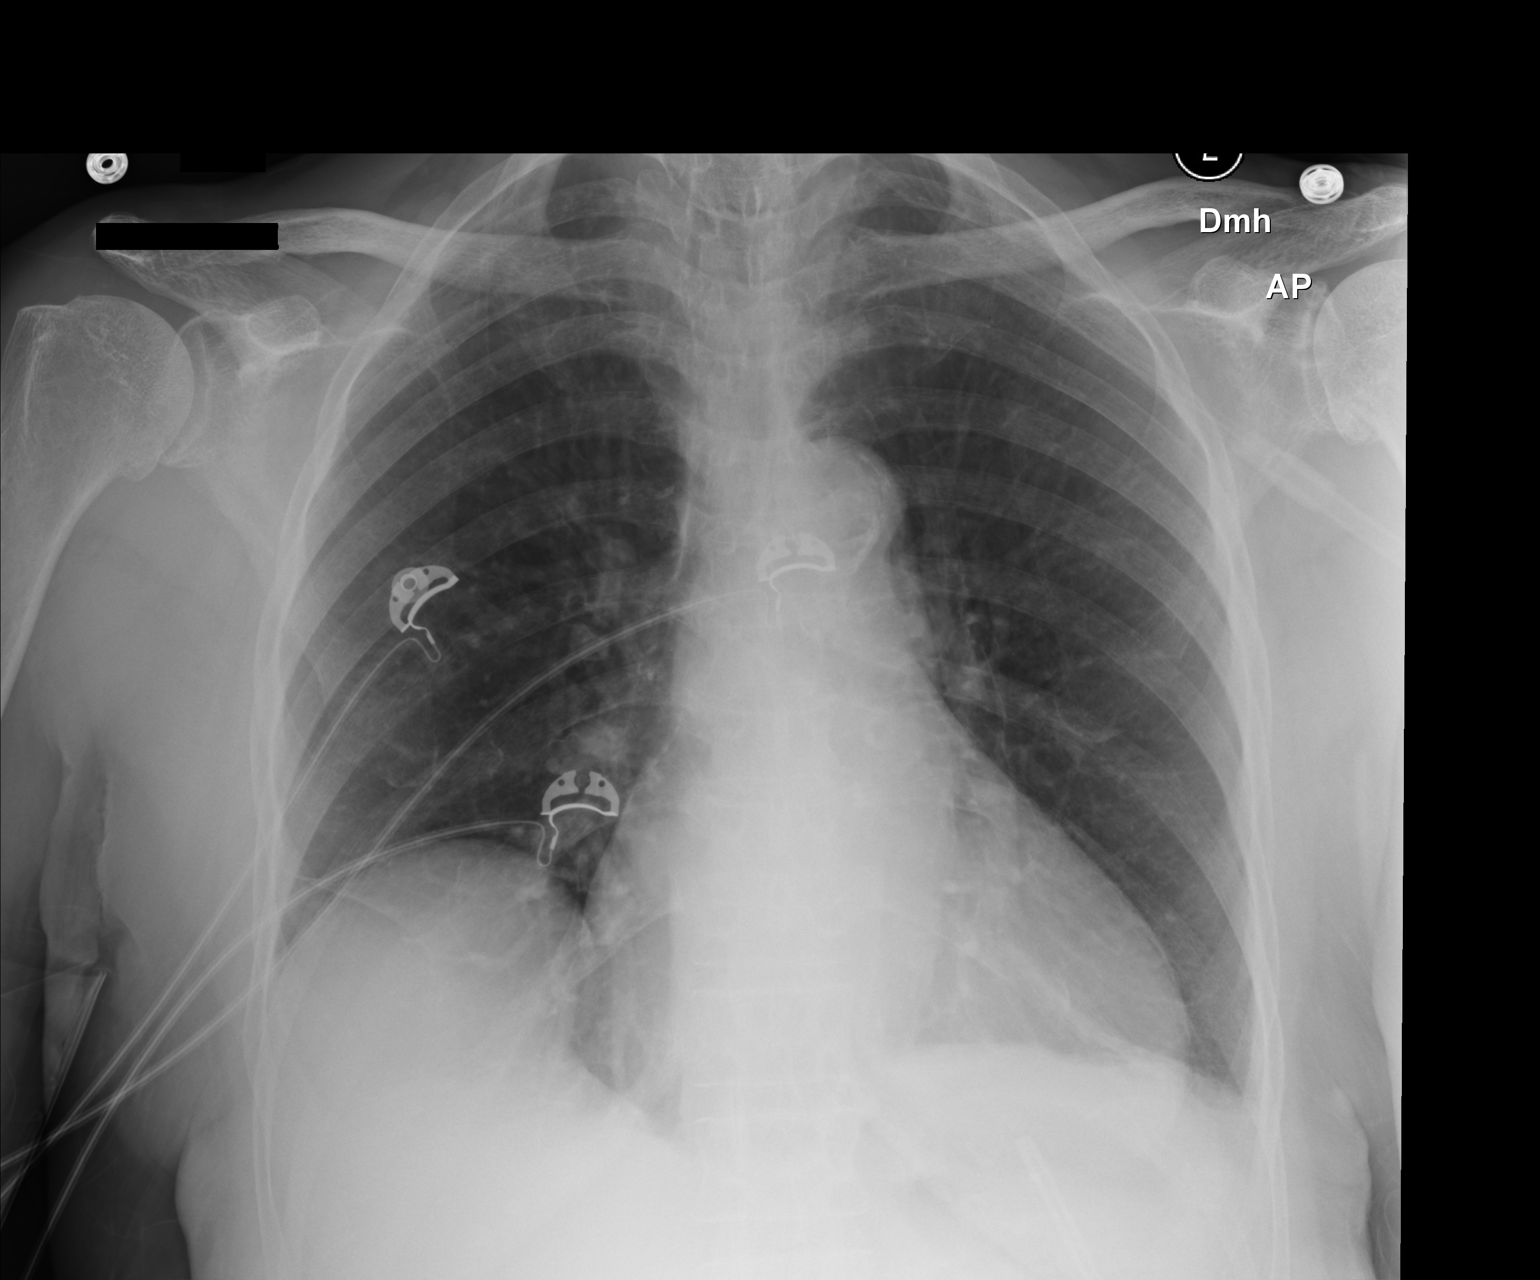

[1 of 1 positions shown; findings below may reference images not displayed]

FINDINGS: Elevated right hemidiaphragm is chronic and unchanged. Heart size
mildly enlarged. Negative for heart failure. Negative for pneumonia.
Small left pleural effusion unchanged from the prior study.
Atherosclerotic calcification aortic arch
IMPRESSION: Chronic elevation right hemidiaphragm. Negative for heart failure or
pneumonia. Small left pleural effusion or scarring unchanged.

## 2018-07-02 ENCOUNTER — Telehealth: Payer: Self-pay

## 2018-07-02 NOTE — Telephone Encounter (Signed)
1. Do you currently have a fever? no (yes = cancel and refer to pcp for e-visit) 2. Have you recently travelled on a cruise, internationally, or to [place], [place], [place], [place], [place], or [place], [place] ([employer]) ? no (yes = cancel, stay home, monitor symptoms, and contact pcp or initiate e-visit if symptoms develop) 3. Have you been in contact with someone that is currently pending confirmation of Covid19 testing or has been confirmed to have the Covid19 virus?  no (yes = cancel, stay home, away from tested individual, monitor symptoms, and contact pcp or initiate e-visit if symptoms develop) 4. Are you currently experiencing fatigue or cough? no (yes = pt should be prepared to have a mask placed at the time of their visit).  Pt. Advised that we are restricting visitors at this time and anyone present in the vehicle should meet the above criteria as well. Advised that visit will be at curbside for finger stick ONLY and will receive call with instructions. Pt also advised to please bring own pen for signature of arrival document.   

## 2018-07-03 ENCOUNTER — Other Ambulatory Visit: Payer: Self-pay

## 2018-07-03 ENCOUNTER — Ambulatory Visit (INDEPENDENT_AMBULATORY_CARE_PROVIDER_SITE_OTHER): Payer: Medicare Other | Admitting: *Deleted

## 2018-07-03 DIAGNOSIS — I4892 Unspecified atrial flutter: Secondary | ICD-10-CM

## 2018-07-03 DIAGNOSIS — I4891 Unspecified atrial fibrillation: Secondary | ICD-10-CM | POA: Diagnosis not present

## 2018-07-03 DIAGNOSIS — Z5181 Encounter for therapeutic drug level monitoring: Secondary | ICD-10-CM

## 2018-07-03 LAB — POCT INR: INR: 1.7 — AB (ref 2.0–3.0)

## 2018-07-03 NOTE — Patient Instructions (Signed)
Description   Spoke with dtr [name] and instructed to have pt take 1.5 tablets today then continue on same dosage 2 tablets daily except 1 tablet on Fridays.  Recheck in 4 weeks.  Call [facility] with any new medications [phone].

## 2018-07-04 ENCOUNTER — Other Ambulatory Visit: Payer: Self-pay | Admitting: *Deleted

## 2018-07-04 DIAGNOSIS — Z4781 Encounter for orthopedic aftercare following surgical amputation: Secondary | ICD-10-CM | POA: Diagnosis not present

## 2018-07-04 DIAGNOSIS — M86171 Other acute osteomyelitis, right ankle and foot: Secondary | ICD-10-CM | POA: Diagnosis not present

## 2018-07-04 DIAGNOSIS — E1152 Type 2 diabetes mellitus with diabetic peripheral angiopathy with gangrene: Secondary | ICD-10-CM | POA: Diagnosis not present

## 2018-07-04 DIAGNOSIS — N179 Acute kidney failure, unspecified: Secondary | ICD-10-CM | POA: Diagnosis not present

## 2018-07-04 DIAGNOSIS — D62 Acute posthemorrhagic anemia: Secondary | ICD-10-CM | POA: Diagnosis not present

## 2018-07-04 DIAGNOSIS — I130 Hypertensive heart and chronic kidney disease with heart failure and stage 1 through stage 4 chronic kidney disease, or unspecified chronic kidney disease: Secondary | ICD-10-CM | POA: Diagnosis not present

## 2018-07-04 MED ORDER — GABAPENTIN 300 MG PO CAPS: 300.0000 mg | ORAL_CAPSULE | Freq: Two times a day (BID) | ORAL | 1 refills | Status: DC

## 2018-07-04 NOTE — Telephone Encounter (Signed)
[facility].

## 2018-07-10 IMAGING — CR DG CHEST 2V
2 series · 2 of 2 positions shown · non-contrast
Comparison: Chest radiograph 01/28/2016

CLINICAL DATA: Left-sided chest pain and pressure

EXAM:
CHEST  2 VIEW

[chest lat]
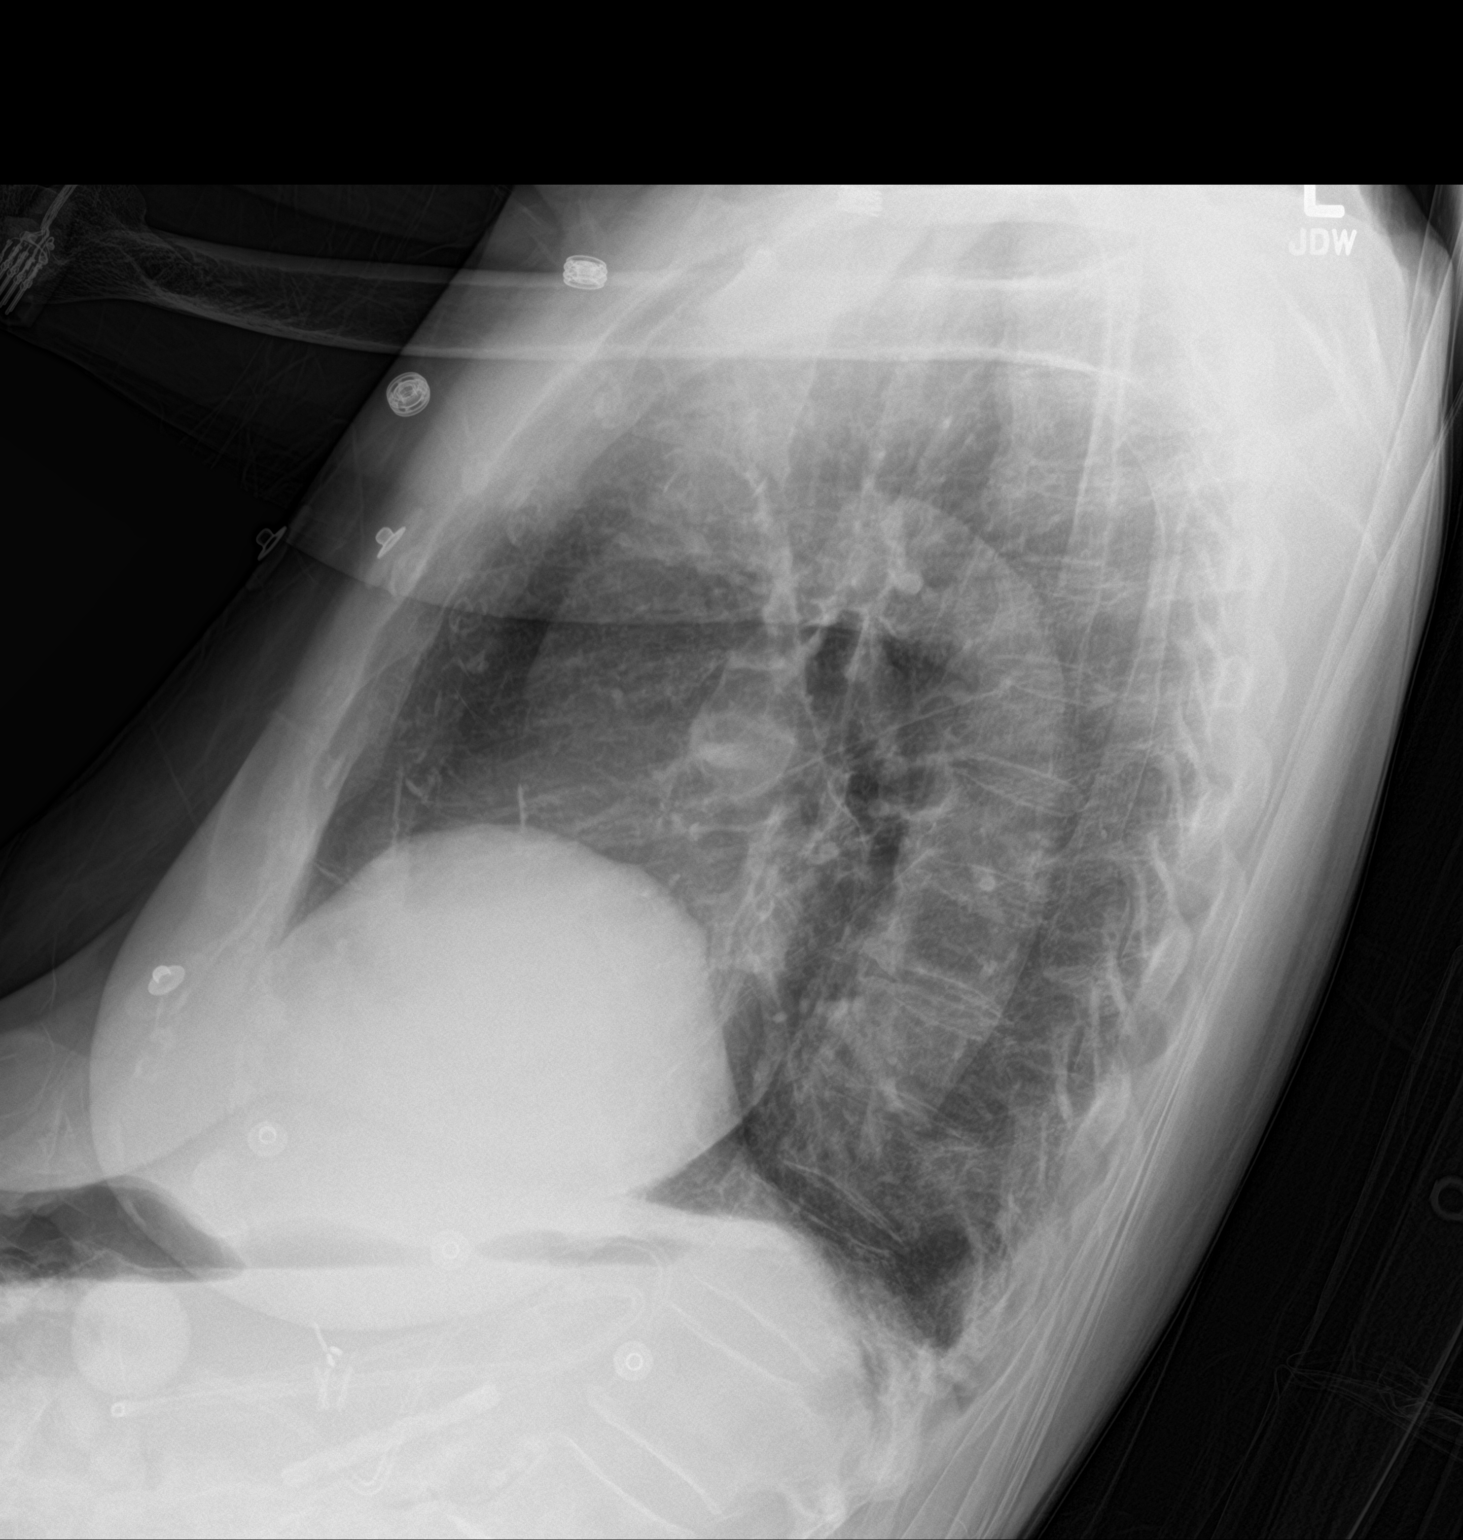

[chest ap]
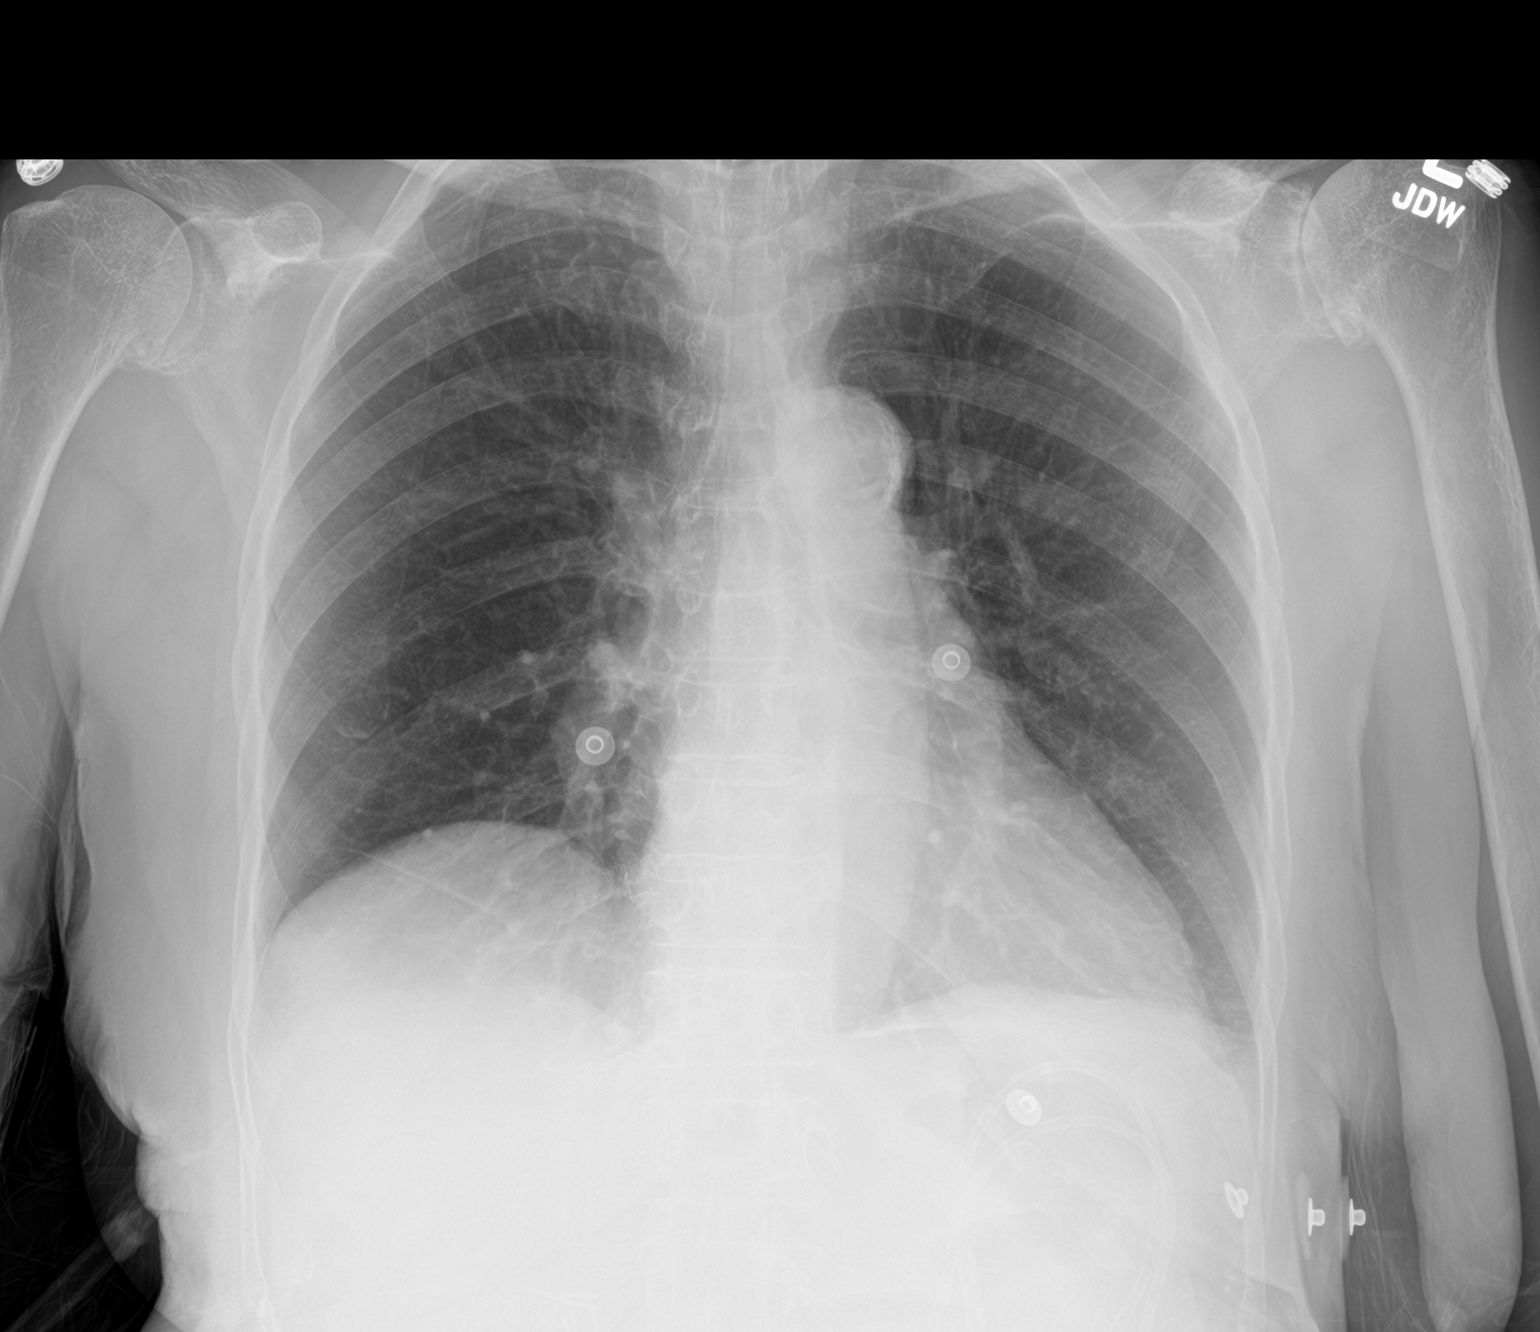

[2 of 2 positions shown; findings below may reference images not displayed]

FINDINGS: There is persistent atherosclerotic calcification in the aortic
arch. Cardiomediastinal contours are otherwise normal. No
pneumothorax or sizable pleural effusion. No pulmonary edema or
focal airspace consolidation.
IMPRESSION: No active cardiopulmonary disease.

Aortic atherosclerosis.

## 2018-07-11 IMAGING — XA IR REPLACE G/J TUBE W/ FLUORO
1 series · 4 of 4 positions shown · non-contrast
Comparison: none

INDICATION: Chronic GJ tube, coiled in the stomach

[Series 300: dsa body · 4 of 4 slices shown]
[im 1/4]
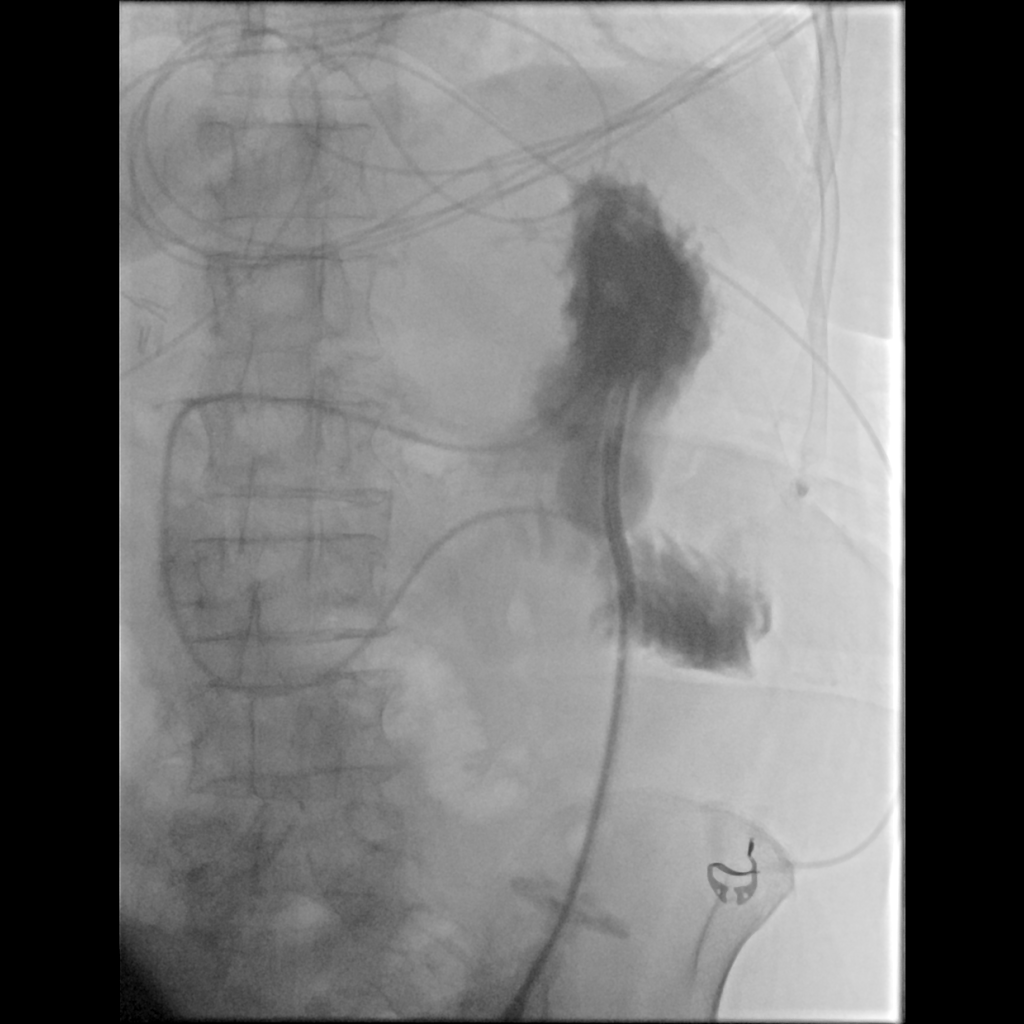
[im 2/4]
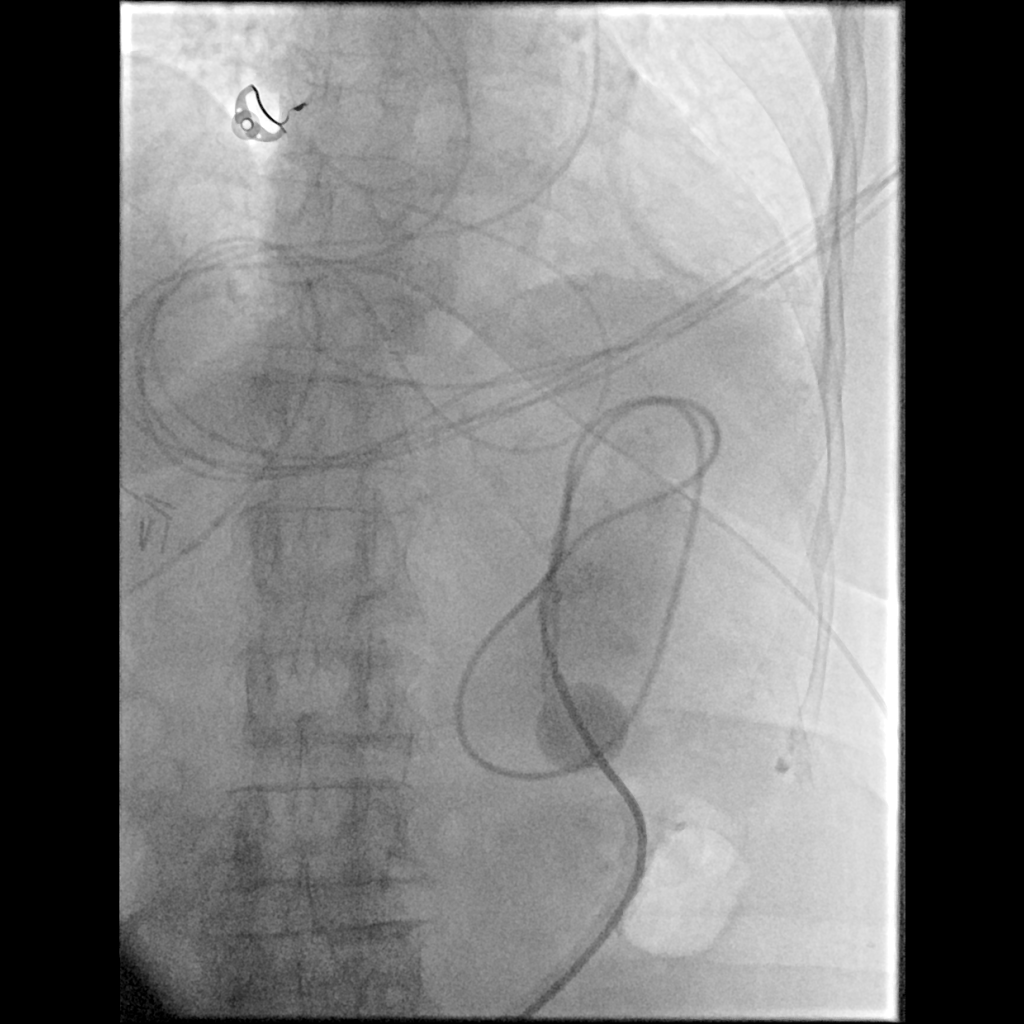
[im 3/4]
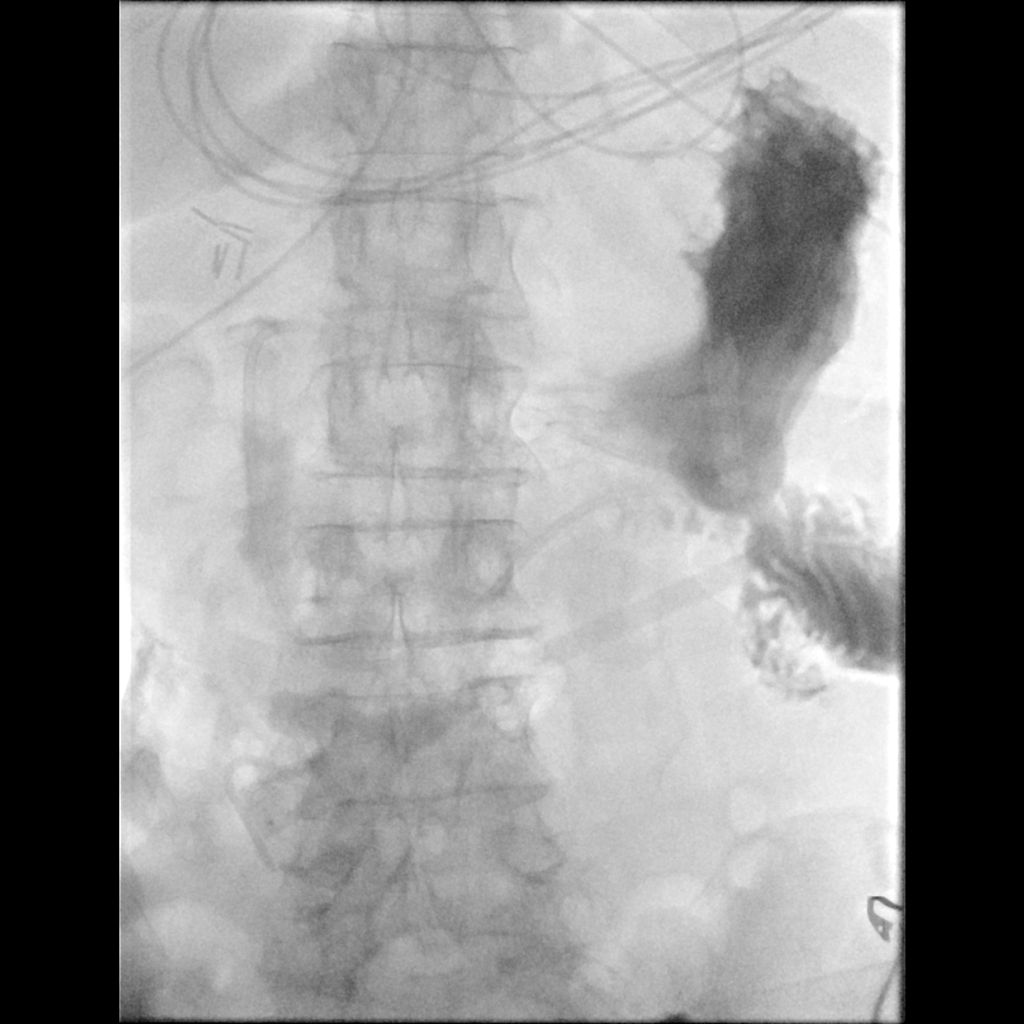
[im 4/4]
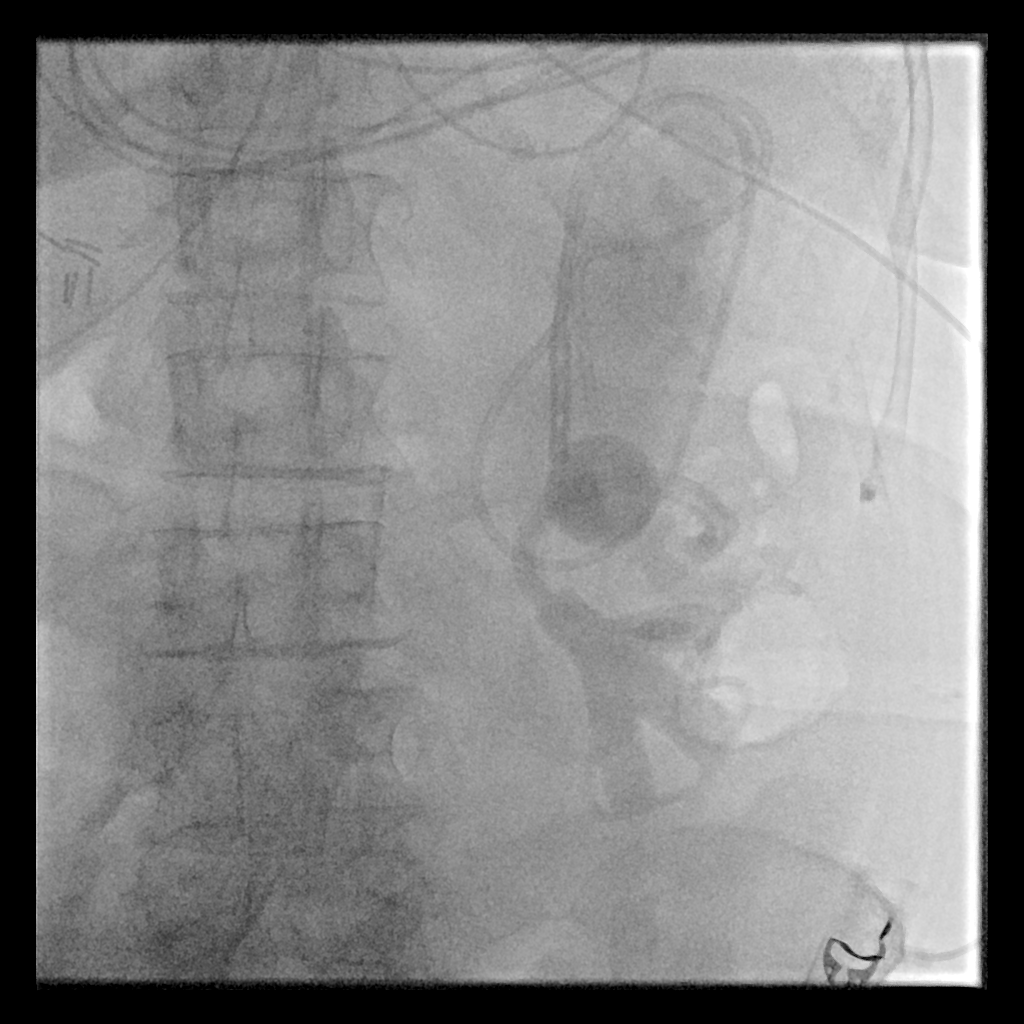

[4 of 4 positions shown; findings below may reference images not displayed]

EXAM:
JEJUNAL CATHETER REPLACEMENT

MEDICATIONS:
None.

ANESTHESIA/SEDATION:
None immediate

CONTRAST:  20 cc - administered into the gastric lumen.

FLUOROSCOPY TIME:  Fluoroscopy Time: 11 minutes 12 seconds (24 mGy).

COMPLICATIONS:
None immediate.

PROCEDURE:
Informed written consent was obtained from the patient after a
thorough discussion of the procedural risks, benefits and
alternatives. All questions were addressed. Maximal Sterile Barrier
Technique was utilized including caps, mask, sterile gowns, sterile
gloves, sterile drape, hand hygiene and skin antiseptic. A timeout
was performed prior to the initiation of the procedure.

The existing GJ tube coiled within the stomach was removed over a
Bentson guidewire. C2 catheter and Glidewire were utilized to
manipulate the access into the proximal jejunum. Over the glidewire,
a new gastrojejunostomy tube was advanced with the jejunal tube in
the proximal jejunum. The gastrostomy port confirmed in the stomach.
Gastric retention balloon inflated with 7 cc saline containing 1 cc
contrast. Images obtained for documentation. Feeding tube ready for
use.
IMPRESSION: Successful fluoroscopic exchange of the gastrojejunostomy.

## 2018-07-11 IMAGING — CR DG ABD PORTABLE 1V
1 series · 1 of 1 positions shown · non-contrast
Comparison: 01/03/2016

CLINICAL DATA: Peg tube placement.  Abdominal pain.

EXAM:
PORTABLE ABDOMEN - 1 VIEW

[AP]
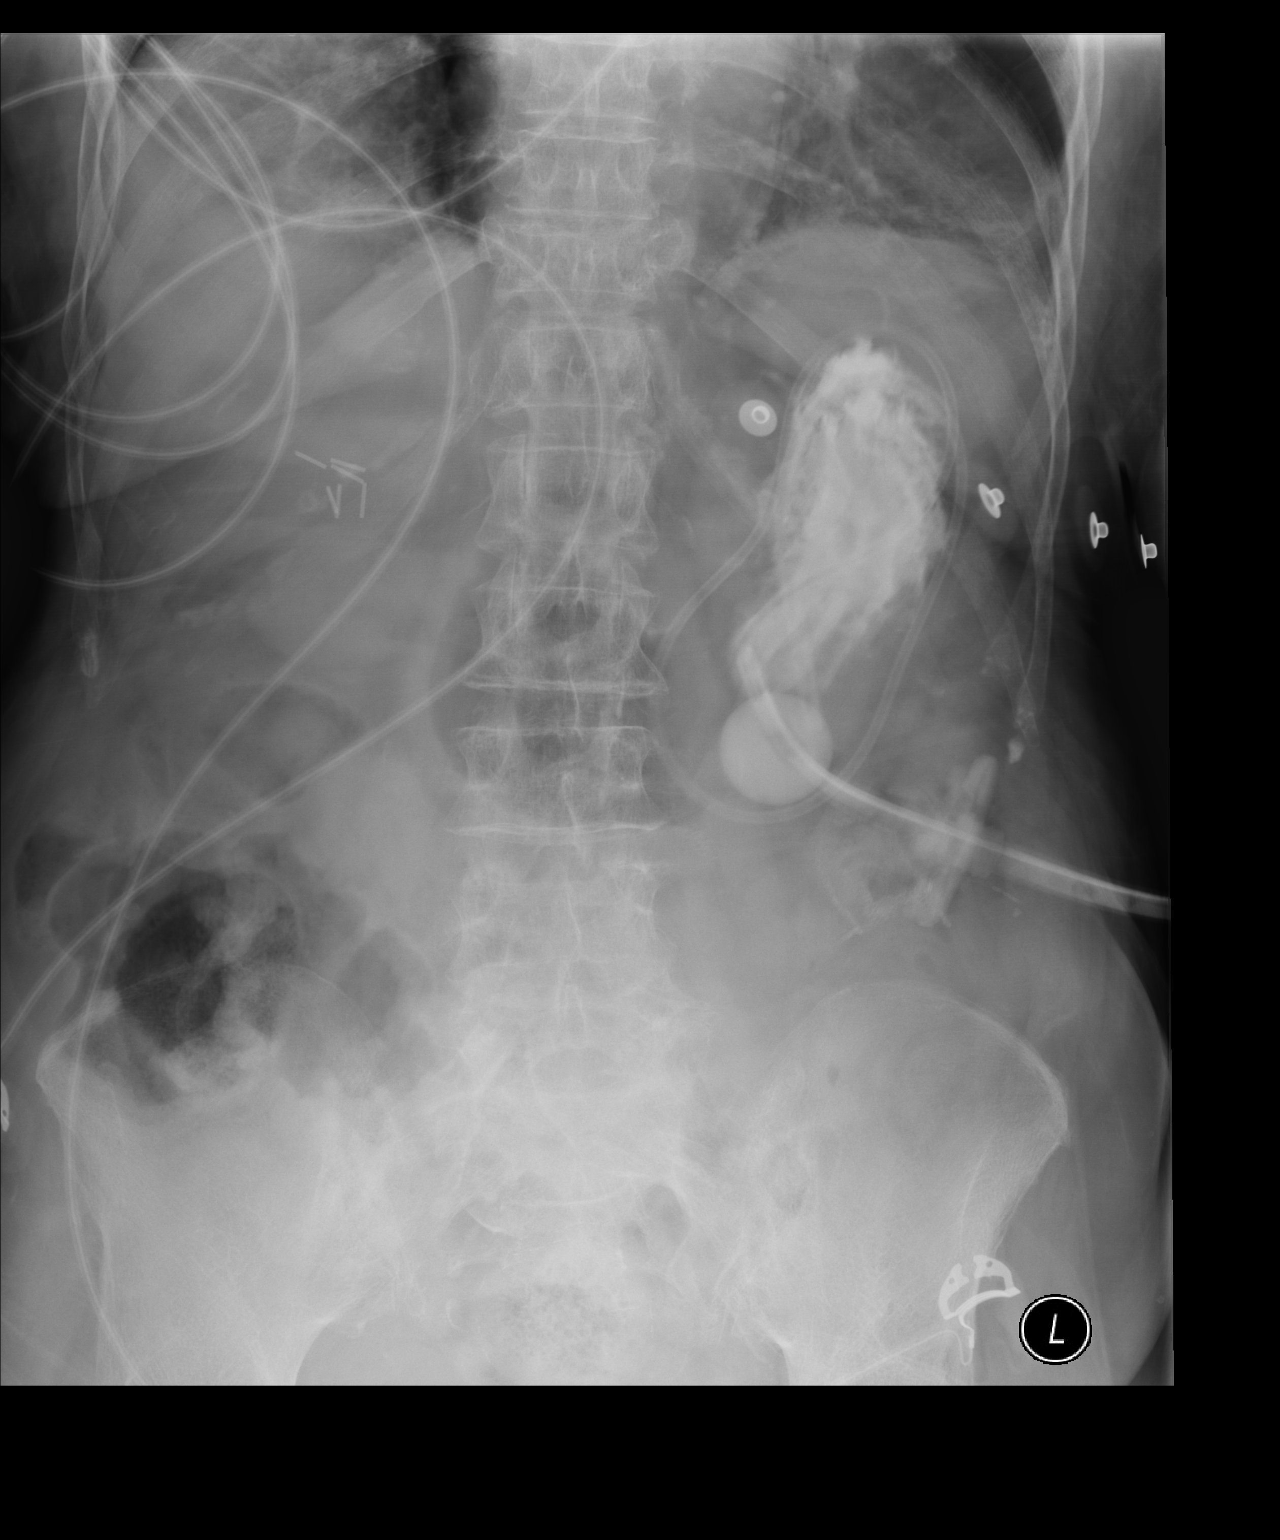

[1 of 1 positions shown; findings below may reference images not displayed]

FINDINGS: Percutaneous gastrojejunostomy tube demonstrated in the left upper
quadrant. Contrast injected into the tube fills the stomach. No
contrast extravasation. The retention balloon is demonstrated along
the greater curvature of the stomach. The tubing is coiled in the
stomach. No small or large bowel distention. Scattered stool in the
colon. Surgical clips in the right upper quadrant. Atelectasis in
the lung bases.
IMPRESSION: Gastrojejunostomy tube remains coiled in the stomach. No contrast
extravasation.

## 2018-07-15 ENCOUNTER — Ambulatory Visit (INDEPENDENT_AMBULATORY_CARE_PROVIDER_SITE_OTHER): Payer: Medicare Other

## 2018-07-15 ENCOUNTER — Ambulatory Visit (INDEPENDENT_AMBULATORY_CARE_PROVIDER_SITE_OTHER): Payer: Medicare Other | Admitting: Podiatry

## 2018-07-15 ENCOUNTER — Encounter: Payer: Self-pay | Admitting: Podiatry

## 2018-07-15 ENCOUNTER — Other Ambulatory Visit: Payer: Self-pay

## 2018-07-15 VITALS — Temp 97.3°F

## 2018-07-15 DIAGNOSIS — I739 Peripheral vascular disease, unspecified: Secondary | ICD-10-CM

## 2018-07-15 DIAGNOSIS — L97512 Non-pressure chronic ulcer of other part of right foot with fat layer exposed: Secondary | ICD-10-CM

## 2018-07-15 DIAGNOSIS — E0859 Diabetes mellitus due to underlying condition with other circulatory complications: Secondary | ICD-10-CM

## 2018-07-15 DIAGNOSIS — I70235 Atherosclerosis of native arteries of right leg with ulceration of other part of foot: Secondary | ICD-10-CM

## 2018-07-15 MED ORDER — DOXYCYCLINE HYCLATE 100 MG PO TABS: 100.0000 mg | ORAL_TABLET | Freq: Two times a day (BID) | ORAL | 0 refills | Status: DC

## 2018-07-15 NOTE — Addendum Note (Signed)
Addended by: Celene Skeen A on: 07/15/2018 03:04 PM   Modules accepted: Orders

## 2018-07-15 NOTE — Progress Notes (Signed)
HPI: 85 year old female presents the office today for evaluation of recurrent ulcerations that have developed to digits 2-4 of the right foot.  Patient underwent partial first ray amputation by vascular surgeon, Dr. Curt Jews, on 05/03/2018.  Patient went on to routine healing in the partial first ray amputation has healed completely.  Patient was discharged postoperatively.  She presents today because the ulcerations to the digits 2-4 have not recurred and she needs further treatment and evaluation  Past Medical History:  Diagnosis Date  . Acute respiratory failure ([place])   . Anemia    previous blood transfusions  . Arthritis    "all over"  . Asthma   . Bradycardia    requiring previous d/c of BB and reduction of amiodarone  . CAD (coronary artery disease)    nonobstructive per notes  . Chronic diastolic CHF (congestive heart failure) ([place])   . CKD (chronic kidney disease), stage III ([place])   . Complication of blood transfusion    "got the wrong blood type at [facility] in ~ 2015; no adverse reaction that we are aware of"/daughter, [name] (01/27/2016)  . COPD (chronic obstructive pulmonary disease) ([place])   . Depression    "light case"  . DVT (deep venous thrombosis) ([place]) 01/2016   a. LLE DVT 01/2016 - switched from Eliquis to Coumadin.  [name] Dyspnea    with some activity  . Gastric stenosis    a. s/p stomach tube  . GERD (gastroesophageal reflux disease)   . History of blood transfusion    "several" (01/27/2016)  . History of stomach ulcers   . Hyperlipidemia   . Hypertension   . Hypothyroidism   . On home oxygen therapy    "2L; 9PM til 9AM" (06/27/2017)  . Paraesophageal hernia   . Perforated gastric ulcer ([place])   . Peripheral vascular disease ([place])   . Pneumonia    "a few times" (06/27/2017)  . Seasonal allergies   . SIADH (syndrome of inappropriate ADH production) ([place])    [name] 01/10/2015  . Small bowel obstruction ([place])    "I don't know how many" (01/11/2015)  .  Stroke [facility])    several- no residual  . Type II diabetes mellitus ([place])    "related to prednisone use  for > 20 yr; once predinose stopped; no more DM RX" (01/27/2016)  . UTI (urinary tract infection) 02/08/2016  . Ventral hernia with bowel obstruction      Physical Exam: General: The patient is alert and oriented x3 in no acute distress.  Dermatology: Skin is warm, dry and supple bilateral lower extremities.  Ulcers noted encompassing the entire digits 2-4 right foot.  Maceration noted.  Malodor noted.  Wound bases a combination of fibrotic and granular tissue.  Wounds measure approximately 1.5 x 1.5 x 0.3 cm to all the digits.  Vascular: Diminished but palpable pedal pulses bilaterally. No edema or erythema noted.   Neurological: Epicritic and protective threshold absent bilaterally.   Musculoskeletal Exam: Status post partial first ray amputation right foot  Radiographic Exam:  Joint spaces preserved. No fracture/dislocation/boney destruction.  I am unable to tell if there is any osteomyelitis to the remaining digits of the foot.  X-rays have decreased osseous mineralization with onset of osteoporosis  Assessment: 1.  Ulcer digit 2-4 right foot with fat layer exposed 2.  Diabetes mellitus 3.  Peripheral vascular disease 4.  Status post partial first ray amputation right   Plan of Care:  1. Patient evaluated. X-Rays reviewed.  2.  Medically necessary excisional debridement including subcutaneous tissue was performed using a tissue nipper.  Excisional debridement of all necrotic nonviable tissue down to healthy bleeding viable tissue was performed with post debridement measurement same as pre- 3.  Recommend Betadine and dry sterile dressing every other day 4.  Continue wearing postoperative shoe 5.  Cultures were taken and sent to pathology 6.  Prescription for doxycycline 100 mg #20 7.  Return to clinic in 2 weeks      Edrick Kins, DPM [facility]  Dr.  Edrick Kins, DPM    [address], [place] [address]                Office [phone]  Fax [phone]

## 2018-07-17 ENCOUNTER — Other Ambulatory Visit: Payer: Self-pay | Admitting: *Deleted

## 2018-07-17 MED ORDER — INSULIN PEN NEEDLE 31G X 5 MM MISC: 3 refills | Status: DC

## 2018-07-17 NOTE — Telephone Encounter (Signed)
[facility]

## 2018-07-20 ENCOUNTER — Encounter: Payer: Self-pay | Admitting: Podiatry

## 2018-07-20 ENCOUNTER — Other Ambulatory Visit: Payer: Self-pay | Admitting: Cardiology

## 2018-07-20 LAB — WOUND CULTURE
MICRO NUMBER:: 391560
SPECIMEN QUALITY:: ADEQUATE

## 2018-07-26 ENCOUNTER — Other Ambulatory Visit: Payer: Self-pay | Admitting: Nurse Practitioner

## 2018-07-29 ENCOUNTER — Ambulatory Visit (INDEPENDENT_AMBULATORY_CARE_PROVIDER_SITE_OTHER): Payer: Medicare Other | Admitting: Podiatry

## 2018-07-29 ENCOUNTER — Other Ambulatory Visit: Payer: Self-pay

## 2018-07-29 DIAGNOSIS — L97512 Non-pressure chronic ulcer of other part of right foot with fat layer exposed: Secondary | ICD-10-CM | POA: Diagnosis not present

## 2018-07-29 DIAGNOSIS — I739 Peripheral vascular disease, unspecified: Secondary | ICD-10-CM

## 2018-07-29 DIAGNOSIS — I70235 Atherosclerosis of native arteries of right leg with ulceration of other part of foot: Secondary | ICD-10-CM

## 2018-07-29 DIAGNOSIS — E0859 Diabetes mellitus due to underlying condition with other circulatory complications: Secondary | ICD-10-CM

## 2018-07-30 ENCOUNTER — Telehealth: Payer: Self-pay

## 2018-07-30 NOTE — Telephone Encounter (Signed)
1. Do you currently have a fever? no (yes = cancel and refer to pcp for e-visit) 2. Have you recently travelled on a cruise, internationally, or to [place], [place], [place], [place], [place], or [place], [place] ([employer]) ? no (yes = cancel, stay home, monitor symptoms, and contact pcp or initiate e-visit if symptoms develop) 3. Have you been in contact with someone that is currently pending confirmation of Covid19 testing or has been confirmed to have the Covid19 virus?  no (yes = cancel, stay home, away from tested individual, monitor symptoms, and contact pcp or initiate e-visit if symptoms develop) 4. Are you currently experiencing fatigue or cough? no (yes = pt should be prepared to have a mask placed at the time of their visit).  Pt. Advised that we are restricting visitors at this time and anyone present in the vehicle should meet the above criteria as well. Advised that visit will be at curbside for finger stick ONLY and will receive call with instructions. Pt also advised to please bring own pen for signature of arrival document.   

## 2018-07-31 ENCOUNTER — Other Ambulatory Visit (HOSPITAL_COMMUNITY): Payer: Self-pay | Admitting: Interventional Radiology

## 2018-07-31 ENCOUNTER — Other Ambulatory Visit: Payer: Self-pay

## 2018-07-31 ENCOUNTER — Ambulatory Visit (INDEPENDENT_AMBULATORY_CARE_PROVIDER_SITE_OTHER): Payer: Medicare Other | Admitting: *Deleted

## 2018-07-31 ENCOUNTER — Other Ambulatory Visit: Payer: Self-pay | Admitting: *Deleted

## 2018-07-31 ENCOUNTER — Ambulatory Visit (INDEPENDENT_AMBULATORY_CARE_PROVIDER_SITE_OTHER): Payer: Medicare Other | Admitting: Family

## 2018-07-31 ENCOUNTER — Encounter: Payer: Self-pay | Admitting: Family

## 2018-07-31 DIAGNOSIS — I4892 Unspecified atrial flutter: Secondary | ICD-10-CM | POA: Diagnosis not present

## 2018-07-31 DIAGNOSIS — R633 Feeding difficulties, unspecified: Secondary | ICD-10-CM

## 2018-07-31 DIAGNOSIS — Z5181 Encounter for therapeutic drug level monitoring: Secondary | ICD-10-CM

## 2018-07-31 DIAGNOSIS — R109 Unspecified abdominal pain: Secondary | ICD-10-CM

## 2018-07-31 DIAGNOSIS — K9423 Gastrostomy malfunction: Secondary | ICD-10-CM | POA: Diagnosis not present

## 2018-07-31 DIAGNOSIS — I4891 Unspecified atrial fibrillation: Secondary | ICD-10-CM

## 2018-07-31 LAB — POCT INR: INR: 2 (ref 2.0–3.0)

## 2018-07-31 IMAGING — CT CT NECK W/O CM
3 of 10 series · 8 of 33 positions shown, 9 images · non-contrast
Comparison: Head CT 11/06/2015, sinus CT 01/27/2016

CLINICAL DATA: Jaw pain.  Pain and swelling.

EXAM:
CT HEAD WITHOUT CONTRAST
CT NECK WITHOUT CONTRAST
TECHNIQUE: Contiguous axial images were obtained from the base of the skull
through the vertex without contrast. Multidetector CT imaging of the
neck was performed using the standard protocol without intravenous
contrast.

[Series 12: coronal st · coronal · 0.45mm/px · 1 of 101 slices shown]
[im 51/101  bone]
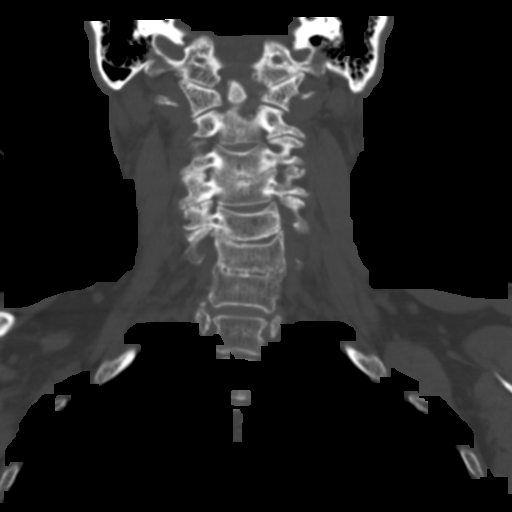

[Series 13: sagittal st · sagittal · 0.45mm/px · 5 of 101 slices shown]
[im 17/101  bone]
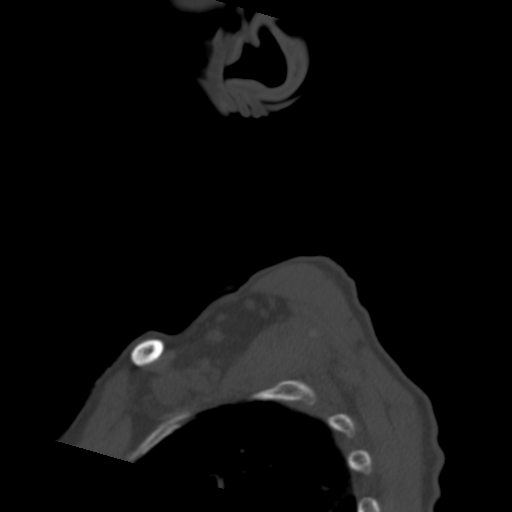
[im 34/101  bone]
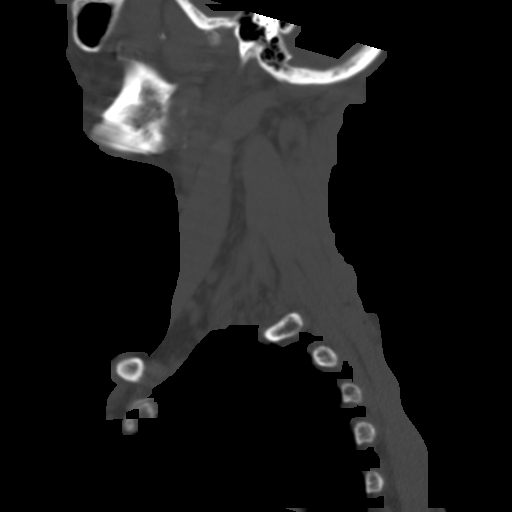
[im 51/101  bone]
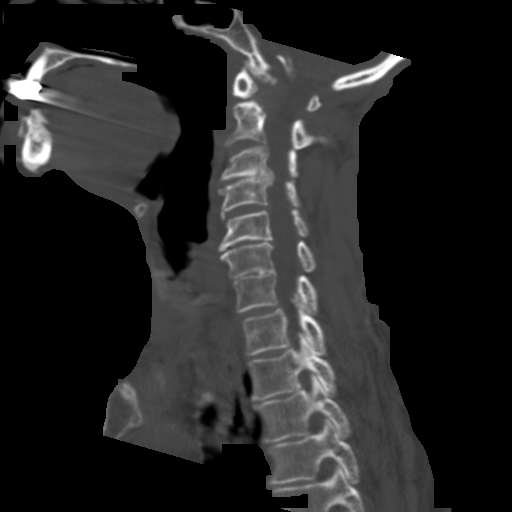
[im 67/101  bone]
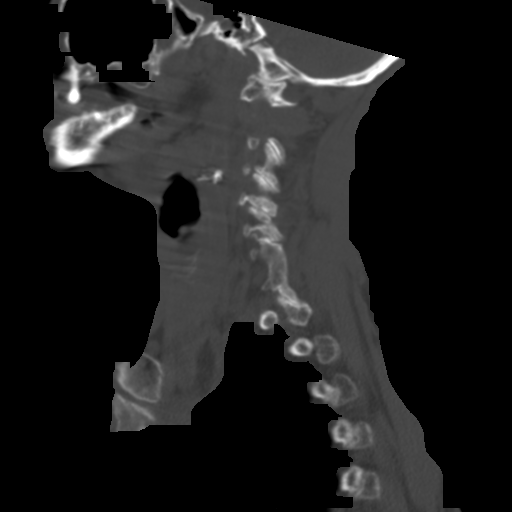
[im 84/101  bone]
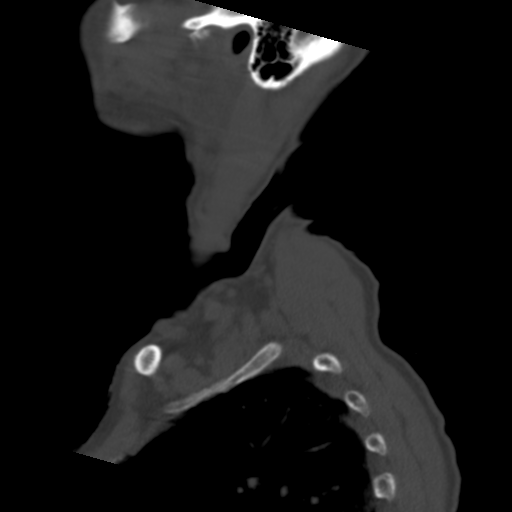

[Series 14: ax axial recons · axial · 0.39mm/px · z∈[-205,-125]mm · 2 of 123 slices shown, 3 images]
[im 41/123  soft-tissue]
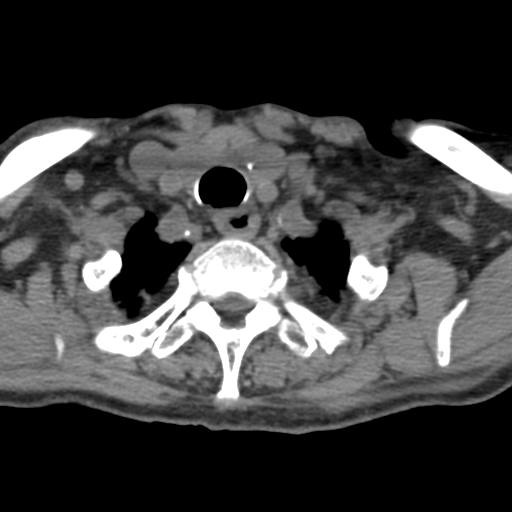
[im 41/123  bone]
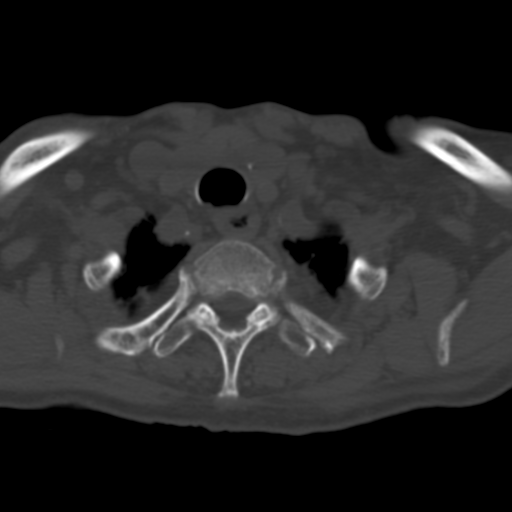
[im 82/123  bone]
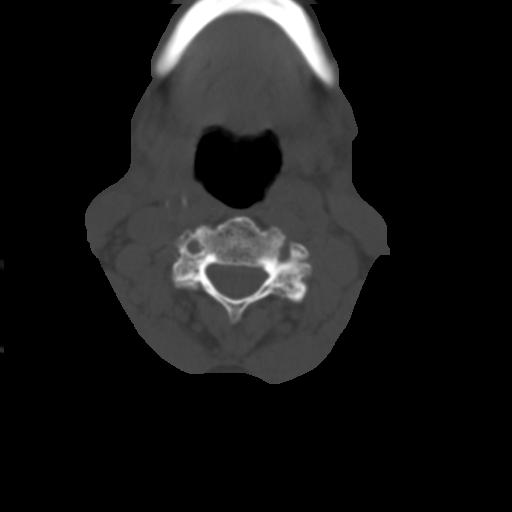

[8 of 33 positions shown; findings below may reference images not displayed]

FINDINGS: CT HEAD FINDINGS

Brain: No acute intracranial hemorrhage. No focal mass lesion. No CT
evidence of acute infarction. No midline shift or mass effect. No
hydrocephalus. Basilar cisterns are patent.

There are periventricular and subcortical white matter
hypodensities. Generalized cortical atrophy.

Vascular: No hyperdense vessel or unexpected calcification.

Skull: Normal. Negative for fracture or focal lesion.

Sinuses/Orbits: No acute finding.

Other: None.

CT NECK FINDINGS

Pharynx and larynx: Normal. No mass or swelling.

Salivary glands: No inflammation, mass, or stone.

Thyroid: Normal.

Lymph nodes: None enlarged or abnormal density.

Vascular: Negative.

Mastoids and visualized paranasal sinuses: Clear.

Skeleton: Motion degradation limits evaluation of the available.
Degenerate change in the cervical spine Set hypertrophy,
osteophytosis and joint space narrowing. Degenerative cystic change
in the dense

Upper chest: Clear

Other: Motion degradation
IMPRESSION: 1. No acute intracranial findings.
2. Atrophy and white matter microvascular disease.
3. No acute findings in the neck.
4. Motion degradation limits evaluation of the mandible. No abscess
or inflammation identified.

## 2018-07-31 NOTE — Patient Instructions (Addendum)
Description   Spoke with dtr [name] (on DPR) and instructed to have pt to continue on same dosage 2 tablets daily except 1 tablet on Fridays.  Recheck in 4 weeks.  Call [facility] with any new medications [phone].

## 2018-07-31 NOTE — Patient Outreach (Signed)
[name]) Care Management  07/31/2018  Shannon [name] 06/26/1923 [id number]   Initial outreach: 07/31/2018  RN attempted outreach call however call was unsuccessful. RN able to leave a HIPAA approved voice message requesting a call back.  Will scheduled another follow up call and send outreach letter accordingly.  [name], RN Care Management Coordinator [place] Office [phone]

## 2018-07-31 NOTE — Progress Notes (Signed)
This service is provided via telemedicine  No vital signs collected/recorded due to the encounter was a telemedicine visit.   Location of patient (ex: home, work):  Home  Patient consents to a telephone visit:  Yes  Location of the provider (ex: office, home):  Office  Name of any referring provider:  Sherrie Mustache, NP   Names of all persons participating in the telemedicine service and their role in the encounter:  Ruthell Rummage CMA, Marlowe Sax, NP, [name]   Time spent on call:  Ruthell Rummage spent 10  Minutes on phone with patient     [facility] clinic  Provider: Marlowe Sax, NP  Code Status: FULL Goals of Care:  Advanced Directives 06/25/2018  Does Patient Have a Medical Advance Directive? Yes  Type of Paramedic of [place];Living will  Does patient want to make changes to medical advance directive? No - Patient declined  Copy of [place] in Chart? -  Would patient like information on creating a medical advance directive? -     Chief Complaint  Patient presents with  . Acute Visit    Patient c/o of stomach pain and feeling full     HPI: Patient is a 85 y.o. female seen today for an acute visit for evaluation of abdominal pain and feeling full.she states has had worsening abdominal pain and feels full.she has had nausea but no vomiting.she had a bowel movement last night Daughter states PEG tube insertion site getting bigger,leaking constantly and abdomen Painful when tube feeding is administered.Feels like abdomen is distended.Had nausea but no vomiting.She denies any fever,chills or cough.   Past Medical History:  Diagnosis Date  . Acute respiratory failure ([place])   . Anemia    previous blood transfusions  . Arthritis    "all over"  . Asthma   . Bradycardia    requiring previous d/c of BB and reduction of amiodarone  . CAD (coronary artery disease)    nonobstructive per notes  . Chronic diastolic CHF  (congestive heart failure) ([place])   . CKD (chronic kidney disease), stage III ([place])   . Complication of blood transfusion    "got the wrong blood type at [facility] in ~ 2015; no adverse reaction that we are aware of"/daughter, [name] (01/27/2016)  . COPD (chronic obstructive pulmonary disease) ([place])   . Depression    "light case"  . DVT (deep venous thrombosis) ([place]) 01/2016   a. LLE DVT 01/2016 - switched from Eliquis to Coumadin.  [name] Dyspnea    with some activity  . Gastric stenosis    a. s/p stomach tube  . GERD (gastroesophageal reflux disease)   . History of blood transfusion    "several" (01/27/2016)  . History of stomach ulcers   . Hyperlipidemia   . Hypertension   . Hypothyroidism   . On home oxygen therapy    "2L; 9PM til 9AM" (06/27/2017)  . Paraesophageal hernia   . Perforated gastric ulcer ([place])   . Peripheral vascular disease ([place])   . Pneumonia    "a few times" (06/27/2017)  . Seasonal allergies   . SIADH (syndrome of inappropriate ADH production) ([place])    [name] 01/10/2015  . Small bowel obstruction ([place])    "I don't know how many" (01/11/2015)  . Stroke [facility])    several- no residual  . Type II diabetes mellitus ([place])    "related to prednisone use  for > 20 yr; once predinose stopped; no more  DM RX" (01/27/2016)  . UTI (urinary tract infection) 02/08/2016  . Ventral hernia with bowel obstruction     Past Surgical History:  Procedure Laterality Date  . ABDOMINAL AORTOGRAM N/A 09/21/2016   Procedure: Abdominal Aortogram;  Surgeon: Waynetta Sandy, MD;  Location: [place] CV LAB;  Service: Cardiovascular;  Laterality: N/A;  . AMPUTATION Left 09/25/2016   Procedure: AMPUTATION DIGIT- LEFT 2ND AND 3RD TOES;  Surgeon: Rosetta Posner, MD;  Location: [place];  Service: Vascular;  Laterality: Left;  . AMPUTATION Right 05/03/2018   Procedure: AMPUTATION RIGHT GREAT TOE;  Surgeon: Rosetta Posner, MD;  Location: [place];  Service: Vascular;  Laterality: Right;  .  CATARACT EXTRACTION W/ INTRAOCULAR LENS  IMPLANT, BILATERAL    . CHOLECYSTECTOMY OPEN    . COLECTOMY    . ESOPHAGOGASTRODUODENOSCOPY N/A 01/19/2014   Procedure: ESOPHAGOGASTRODUODENOSCOPY (EGD);  Surgeon: Irene Shipper, MD;  [facility];  Service: Endoscopy;  Laterality: N/A;  . ESOPHAGOGASTRODUODENOSCOPY N/A 01/20/2014   Procedure: ESOPHAGOGASTRODUODENOSCOPY (EGD);  Surgeon: Irene Shipper, MD;  Location: [facility];  Service: Endoscopy;  Laterality: N/A;  . ESOPHAGOGASTRODUODENOSCOPY N/A 03/19/2014   Procedure: ESOPHAGOGASTRODUODENOSCOPY (EGD);  Surgeon: Milus Banister, MD;  Location: [facility];  Service: Endoscopy;  Laterality: N/A;  . ESOPHAGOGASTRODUODENOSCOPY N/A 07/08/2015   Procedure: ESOPHAGOGASTRODUODENOSCOPY (EGD);  Surgeon: Doran Stabler, MD;  Location: [facility] ENDOSCOPY;  Service: Endoscopy;  Laterality: N/A;  . ESOPHAGOGASTRODUODENOSCOPY (EGD) WITH PROPOFOL N/A 09/15/2015   Procedure: ESOPHAGOGASTRODUODENOSCOPY (EGD) WITH PROPOFOL;  Surgeon: Manus Gunning, MD;  [facility];  Service: Gastroenterology;  Laterality: N/A;  . GASTROJEJUNOSTOMY     hx/notes 01/10/2015  . GASTROJEJUNOSTOMY N/A 09/23/2015   Procedure: OPEN GASTROJEJUNOSTOMY TUBE PLACEMENT;  Surgeon: Arta Bruce Kinsinger, MD;  Location: [facility];  Service: General;  Laterality: N/A;  . GLAUCOMA SURGERY Bilateral   . HERNIA REPAIR  2015  . IR CM INJ ANY COLONIC TUBE W/FLUORO  01/05/2017  . IR CM INJ ANY COLONIC TUBE W/FLUORO  06/07/2017  . IR CM INJ ANY COLONIC TUBE W/FLUORO  11/05/2017  . IR GENERIC HISTORICAL  01/07/2016   IR GJ TUBE CHANGE 01/07/2016 Jacqulynn Cadet, MD [facility]  . IR GENERIC HISTORICAL  01/27/2016   IR MECH REMOV OBSTRUC MAT ANY COLON TUBE W/FLUORO 01/27/2016 Markus Daft, MD [facility]  . IR GENERIC HISTORICAL  02/07/2016   IR PATIENT EVAL TECH 0-60 MINS Darrell K Allred, PA-C [facility]  . IR GENERIC HISTORICAL  02/08/2016   IR GJ TUBE CHANGE 02/08/2016 Greggory Keen,  MD [facility]  . IR GENERIC HISTORICAL  01/06/2016   IR GJ TUBE CHANGE 01/06/2016 [facility] OUT REF  . IR GENERIC HISTORICAL  05/02/2016   IR CM INJ ANY COLONIC TUBE W/FLUORO 05/02/2016 Arne Cleveland, MD [facility]  . IR GENERIC HISTORICAL  05/15/2016   IR GJ TUBE CHANGE 05/15/2016 Sandi Mariscal, MD [facility]  . IR GENERIC HISTORICAL  06/28/2016   IR GJ TUBE CHANGE 06/28/2016 [facility]  . IR GJ TUBE CHANGE  02/20/2017  . IR GJ TUBE CHANGE  05/10/2017  . IR GJ TUBE CHANGE  07/06/2017  . IR GJ TUBE CHANGE  08/02/2017  . IR GJ TUBE CHANGE  10/15/2017  . IR GJ TUBE CHANGE  12/26/2017  . IR [place] TUBE CHANGE  04/08/2018  . IR [place] TUBE CHANGE  06/19/2018  . IR PATIENT EVAL TECH 0-60 MINS  10/19/2016  . IR PATIENT EVAL TECH 0-60 MINS  12/25/2016  .  IR PATIENT EVAL TECH 0-60 MINS  01/29/2017  . IR PATIENT EVAL TECH 0-60 MINS  04/04/2017  . IR PATIENT EVAL TECH 0-60 MINS  04/30/2017  . IR PATIENT EVAL TECH 0-60 MINS  07/31/2017  . IR REPLC DUODEN/JEJUNO TUBE PERCUT W/FLUORO  11/14/2016  . LAPAROTOMY N/A 01/20/2015   Procedure: EXPLORATORY LAPAROTOMY;  Surgeon: Coralie Keens, MD;  Location: [place];  Service: General;  Laterality: N/A;  . LOWER EXTREMITY ANGIOGRAPHY Left 09/21/2016   Procedure: Lower Extremity Angiography;  Surgeon: Waynetta Sandy, MD;  Location: [place] CV LAB;  Service: Cardiovascular;  Laterality: Left;  . LOWER EXTREMITY ANGIOGRAPHY Right 06/27/2017   Procedure: Lower Extremity Angiography;  Surgeon: Waynetta Sandy, MD;  Location: [place] CV LAB;  Service: Cardiovascular;  Laterality: Right;  . LYSIS OF ADHESION N/A 01/20/2015   Procedure: LYSIS OF ADHESIONS < 1 HOUR;  Surgeon: Coralie Keens, MD;  Location: [place];  Service: General;  Laterality: N/A;  . PERIPHERAL VASCULAR BALLOON ANGIOPLASTY Left 09/21/2016   Procedure: Peripheral Vascular Balloon Angioplasty;  Surgeon: Waynetta Sandy, MD;  Location: [place] CV LAB;  Service: Cardiovascular;   Laterality: Left;  drug coated balloon  . PERIPHERAL VASCULAR BALLOON ANGIOPLASTY Right 06/27/2017   Procedure: PERIPHERAL VASCULAR BALLOON ANGIOPLASTY;  Surgeon: Waynetta Sandy, MD;  Location: [place] CV LAB;  Service: Cardiovascular;  Laterality: Right;  SFA/TPTRUNK  . TONSILLECTOMY    . TUBAL LIGATION    . VENTRAL HERNIA REPAIR  2015   incarcerated ventral hernia ([facility] 09/2013)/notes 01/10/2015    Allergies  Allergen Reactions  . Penicillins Itching, Rash and Other (See Comments)    Tolerated amoxicillin, unasyn, zosyn & cephalosporins in the past. Did it involve swelling of the face/tongue/throat, SOB, or low BP? No Did it involve sudden or severe rash/hives, skin peeling, or any reaction on the inside of your mouth or nose? No Did you need to seek medical attention at a hospital or doctor's office? Unknown When did it last happen?5+ years If all above answers are "NO", may proceed with cephalosporin use.     Outpatient Encounter Medications as of 07/31/2018  Medication Sig  . acetaminophen (TYLENOL) 500 MG tablet Take 500 mg by mouth every 6 (six) hours as needed (pain).   . Amino Acids-Protein Hydrolys (FEEDING SUPPLEMENT, PRO-STAT SUGAR FREE 64,) LIQD Take 30 mLs by mouth 2 (two) times daily.  [name] atorvastatin (LIPITOR) 10 MG tablet TAKE 1 TABLET BY MOUTH EVERY DAY  . b complex vitamins tablet Take 1 tablet by mouth daily.   . calcium-vitamin D (OSCAL WITH D) 500-200 MG-UNIT per tablet Take 1 tablet by mouth daily with breakfast.   . Carboxymethylcellulose Sodium (ARTIFICIAL TEARS OP) Place 1 drop into both eyes 4 (four) times daily.  . carvedilol (COREG) 6.25 MG tablet Take 1 tablet (6.25 mg total) by mouth 2 (two) times daily.  . collagenase (SANTYL) ointment Apply 1 application topically daily as needed. For wound care  . cycloSPORINE (RESTASIS) 0.05 % ophthalmic emulsion USE 1 DROP INTO BOTH EYES TWICE DAILY  . feeding supplement (OSMOLITE 1 CAL) LIQD Take 711  mLs by mouth See admin instructions. For 12 hours 2100 - 0900  . ferrous sulfate 325 (65 FE) MG tablet Take 1 tablet (325 mg total) by mouth daily.  . fluticasone (FLONASE) 50 MCG/ACT nasal spray Spray 2 sprays into each nostril once daily  . furosemide (LASIX) 40 MG tablet TAKE 1 TABLET BY MOUTH EVERY DAY  . gabapentin (NEURONTIN)  300 MG capsule TAKE 1 CAPSULE BY MOUTH TWICE A DAY  . gentamicin cream (GARAMYCIN) 0.1 % Apply 1 application topically 3 (three) times daily.  . hydrALAZINE (APRESOLINE) 50 MG tablet TAKE 1 TABLET BY MOUTH THREE TIMES A DAY  . insulin aspart (NOVOLOG) 100 UNIT/ML FlexPen Inject 0-6 Units into the skin 3 (three) times daily with meals. New sliding Scale If BS is 151 - 251 = 2 units,  If BS is 251 -  350 = 4 units,  If BS is 351 - 450 = 6 units Call provider if BS runs more than 400  . insulin glargine (LANTUS) 100 UNIT/ML injection Inject 8 Units into the skin at bedtime.   . Insulin Pen Needle (B-D UF III MINI PEN NEEDLES) 31G X 5 MM MISC Use twice daily with giving insulin injections. Dx E11.9  . ipratropium-albuterol (DUONEB) 0.5-2.5 (3) MG/3ML SOLN Take 3 mLs by nebulization every 6 (six) hours as needed (sob). Use 3 times daily x 4 days then every 6 hours as needed.  . isosorbide mononitrate (IMDUR) 30 MG 24 hr tablet TAKE 2 TABLETS (60 MG TOTAL) BY MOUTH DAILY.  [name] latanoprost (XALATAN) 0.005 % ophthalmic solution Place 1 drop into both eyes at bedtime.  . levalbuterol (XOPENEX HFA) 45 MCG/ACT inhaler Inhale 2 puffs into the lungs every 6 (six) hours as needed for wheezing or shortness of breath.  . levothyroxine (SYNTHROID, LEVOTHROID) 125 MCG tablet Take 1 tablet (125 mcg total) by mouth daily.  [name] loratadine (CLARITIN) 10 MG tablet Take 10 mg by mouth daily as needed for allergies.  . mometasone-formoterol (DULERA) 200-5 MCG/ACT AERO TAKE TWO PUFFS BY MOUTH TWICE DAILY  . Multiple Vitamin (MULTIVITAMIN WITH MINERALS) TABS tablet Take 1 tablet by mouth daily.  [name]  nystatin (MYCOSTATIN/NYSTOP) powder Apply 1 g topically as needed.  [name] oxyCODONE (ROXICODONE) 5 MG immediate release tablet Take 1 tablet (5 mg total) by mouth every 4 (four) hours as needed for severe pain. May take 1/2 tab q4hrs as needed for mild pain.  . OXYGEN Inhale 2 L into the lungs at bedtime.  . pantoprazole (PROTONIX) 40 MG tablet Take 1 tablet (40 mg total) by mouth 2 (two) times daily.  . sertraline (ZOLOFT) 100 MG tablet TAKE 1 TABLET BY MOUTH EVERY DAY FOR DEPRESSION  . warfarin (COUMADIN) 2.5 MG tablet Take by mouth See admin instructions. Take 5 mg every evening except Friday take 2.5 mg in the evening.  . Water For Irrigation, Sterile (FREE WATER) SOLN Place 150 mLs into feeding tube 3 (three) times daily.   Dema Severin Petrolatum-Mineral Oil (SYSTANE NIGHTTIME) OINT Place 1 application into both eyes at bedtime.   . [DISCONTINUED] doxycycline (VIBRA-TABS) 100 MG tablet Take 1 tablet (100 mg total) by mouth 2 (two) times daily.   No facility-administered encounter medications on file as of 07/31/2018.     Review of Systems:  Review of Systems  Constitutional: Negative for appetite change, chills, fatigue and fever.  HENT: Positive for hearing loss. Negative for congestion, rhinorrhea, sinus pressure, sinus pain, sneezing and sore throat.   Respiratory: Negative for cough, chest tightness, shortness of breath and wheezing.   Cardiovascular: Negative for chest pain, palpitations and leg swelling.  Gastrointestinal: Positive for abdominal distention, abdominal pain and nausea. Negative for constipation, diarrhea and vomiting.       PEG tube insertion site getting bigger,leaking constantly and abdomen Painful when tube feeding is administered. Feels like abdomen is distended.Had nausea but no vomiting. LBM  07/30/2018   Skin: Negative for color change, pallor and rash.       PEG tube site opening getting bigger.   Psychiatric/Behavioral: Negative for agitation and confusion. The patient  is not nervous/anxious.     Health Maintenance  Topic Date Due  . FOOT EXAM  08/22/2017  . OPHTHALMOLOGY EXAM  05/07/2018  . MAMMOGRAM  10/17/2023 (Originally 10/23/2015)  . HEMOGLOBIN A1C  10/16/2018  . INFLUENZA VACCINE  11/02/2018  . TETANUS/TDAP  07/19/2026  . DEXA SCAN  Completed  . PNA vac Low Risk Adult  Completed    Physical Exam: There were no vitals filed for this visit. There is no height or weight on file to calculate BMI. Physical Exam  unable to complete on Telephone visit.   Labs reviewed: Basic Metabolic Panel: Recent Labs    08/01/17 1039 08/07/17 1119  04/17/18 1526  05/07/18 0259 05/09/18 0311 05/14/18 1235  NA 136 136   < > 141   < > 135 136 141  K 4.7 4.7   < > 4.4   < > 4.1 3.9 4.5  CL 100 98   < > 101   < > 103 102 105  CO2 30 25   < > 34*   < > 21* 22 28  GLUCOSE 125* 70   < > 86   < > 211* 208* 35*  BUN 65* 52*   < > 68*   < > 37* 40* 67*  CREATININE 1.53* 1.33*   < > 1.49*   < > 1.30* 1.28* 1.42*  CALCIUM 9.3 9.2   < > 9.9   < > 8.9 9.0 9.5  TSH 3.67 4.880*  --  0.15*  --   --   --   --    < > = values in this interval not displayed.   Liver Function Tests: Recent Labs    08/07/17 1119  04/17/18 1526 05/03/18 1055 05/14/18 1235  AST 22   < > 22 42* 24  ALT 15   < > 18 34 22  ALKPHOS 99  --   --  59  --   BILITOT 0.2   < > 0.4 1.0 0.3  PROT 8.1   < > 7.3 7.6 7.2  ALBUMIN 4.1  --   --  3.1*  --    < > = values in this interval not displayed.   CBC: Recent Labs    10/05/17 1047 04/17/18 1526  05/07/18 0259 05/09/18 0311 05/14/18 1235  WBC 6.7 5.7   < > 7.5 8.0 6.2  NEUTROABS 4,402 3,431  --   --   --  3,590  HGB 9.4* 10.3*   < > 7.9* 8.5* 8.8*  HCT 29.7* 31.5*   < > 24.1* 25.1* 27.0*  MCV 88.9 93.8   < > 91.3 90.3 94.7  PLT 265 207   < > 235 288 366   < > = values in this interval not displayed.   Lipid Panel: Recent Labs    08/01/17 1039  CHOL 97  HDL 53  LDLCALC 23  TRIG 120  CHOLHDL 1.8   Lab Results   Component Value Date   HGBA1C 6.6 (H) 04/17/2018    Procedures since last visit: Dg Foot Complete Right  Result Date: 07/15/2018 Please see detailed radiograph report in office note.   Assessment/Plan 1. Abdominal pain, unspecified abdominal location Reports feeling of fullness with nausea but no vomiting.Pain with tube feeding infusion.LBM 07/30/2018 normal consistency  reported.Recommended further evaluation in the ED.    2. Leaking PEG tube (HCC) Reports increase PEG tube Insertion site with constant leaking.abdominal pain with tube feeding infusion.Recommended sending to ED for further evaluation.   Labs/tests ordered: Recommended sending to ED for further evaluation.  Next appt:  08/14/2018  Time spent with patient 12 minutes >50% time spent counseling; reviewing medical record; tests; labs; and developing future plan of care.

## 2018-08-01 ENCOUNTER — Other Ambulatory Visit (HOSPITAL_COMMUNITY): Payer: Self-pay | Admitting: Interventional Radiology

## 2018-08-01 ENCOUNTER — Ambulatory Visit (HOSPITAL_COMMUNITY)
Admission: RE | Admit: 2018-08-01 | Discharge: 2018-08-01 | Disposition: A | Discharge status: Home / Self Care | Payer: Medicare Other | Source: Ambulatory Visit | Attending: Interventional Radiology | Admitting: Interventional Radiology

## 2018-08-01 ENCOUNTER — Other Ambulatory Visit: Payer: Self-pay

## 2018-08-01 DIAGNOSIS — R633 Feeding difficulties, unspecified: Secondary | ICD-10-CM

## 2018-08-01 DIAGNOSIS — K9423 Gastrostomy malfunction: Secondary | ICD-10-CM | POA: Diagnosis not present

## 2018-08-01 DIAGNOSIS — L304 Erythema intertrigo: Secondary | ICD-10-CM | POA: Diagnosis not present

## 2018-08-01 DIAGNOSIS — Z434 Encounter for attention to other artificial openings of digestive tract: Secondary | ICD-10-CM | POA: Diagnosis not present

## 2018-08-01 MED ORDER — IOHEXOL 300 MG/ML  SOLN
50.0000 mL | Freq: Once | INTRAMUSCULAR | Status: AC | PRN
  Administered 2018-08-01: 10 mL

## 2018-08-02 ENCOUNTER — Other Ambulatory Visit (HOSPITAL_COMMUNITY): Payer: Medicare Other

## 2018-08-03 IMAGING — CR DG CHEST 2V
2 series · 2 of 2 positions shown · non-contrast
Comparison: 02/07/2016

CLINICAL DATA: Shortness of Breath

EXAM:
CHEST  2 VIEW

[chest lat]
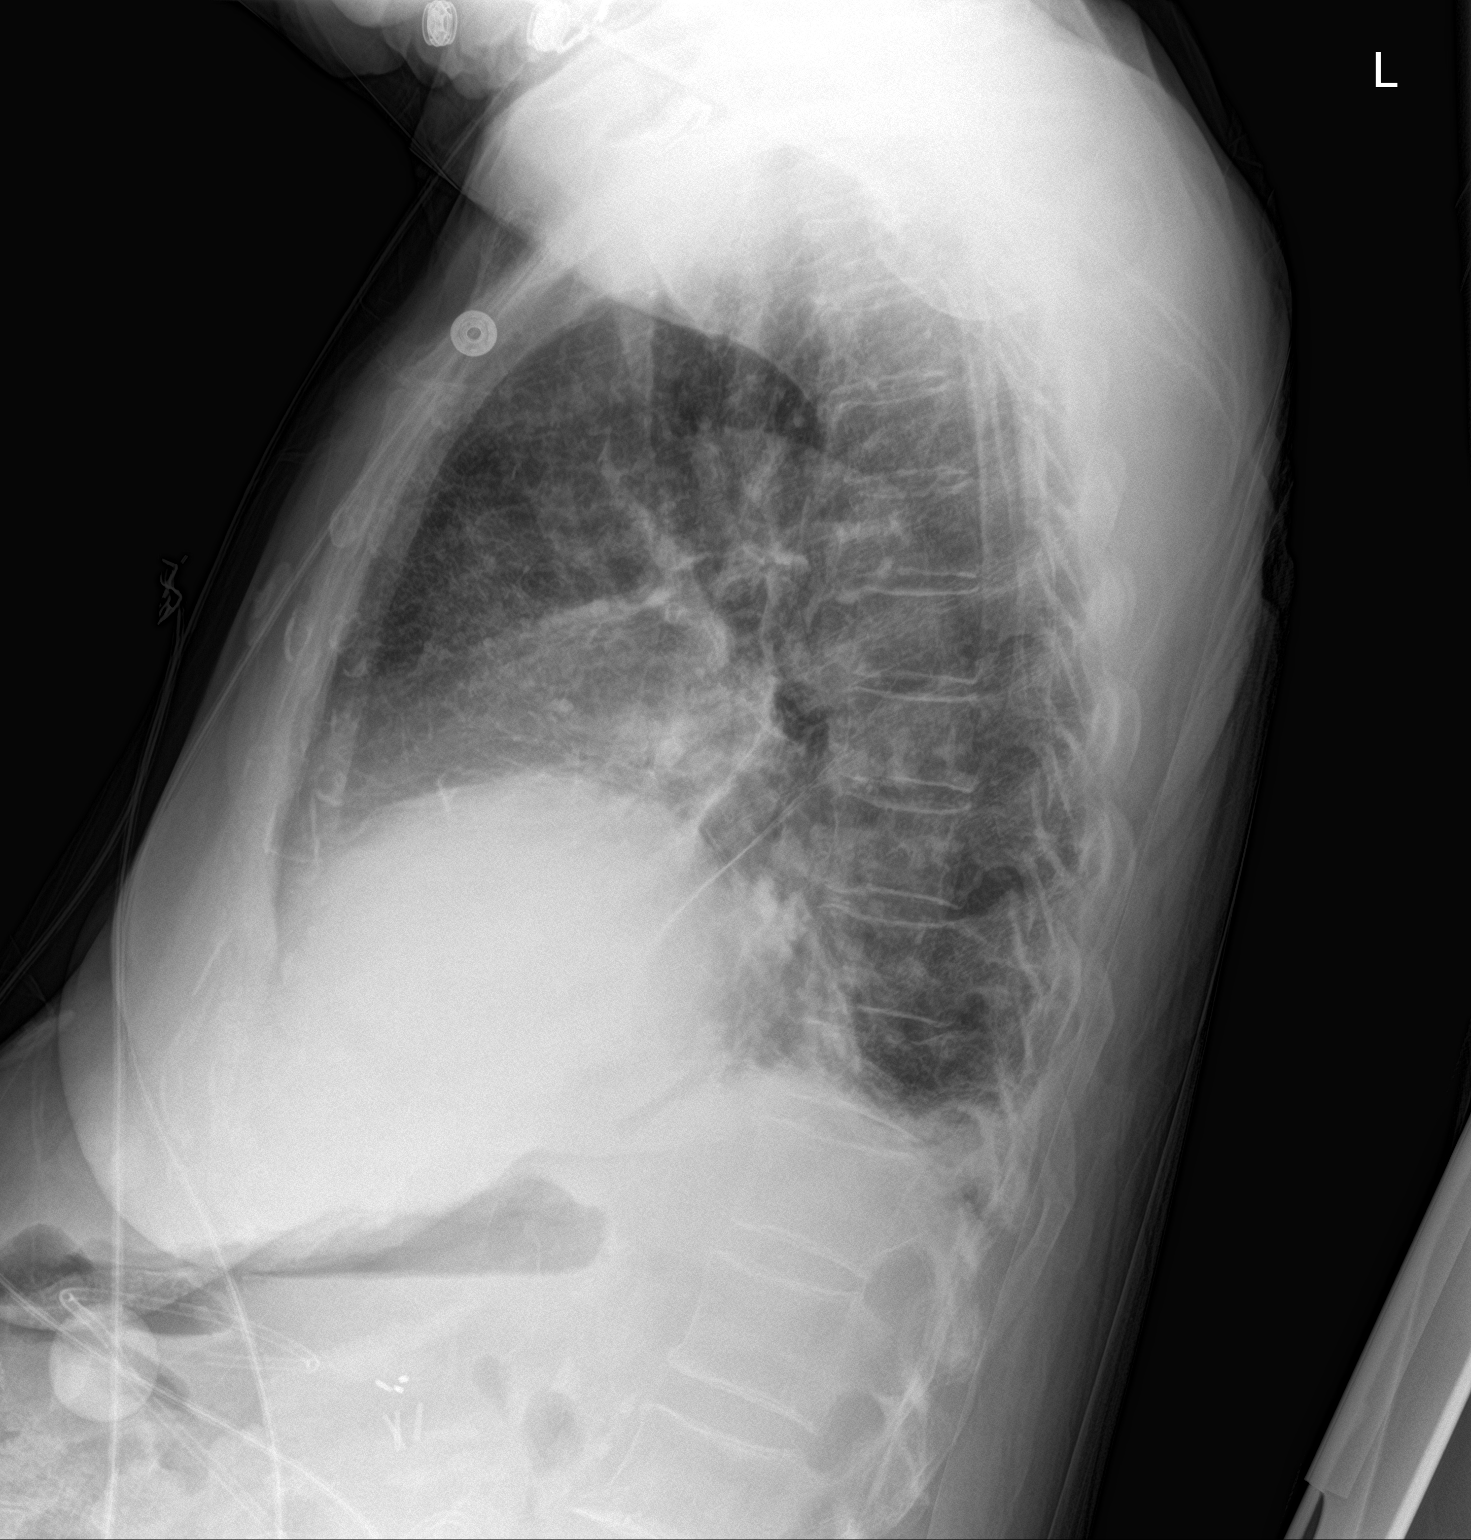

[chest ap]
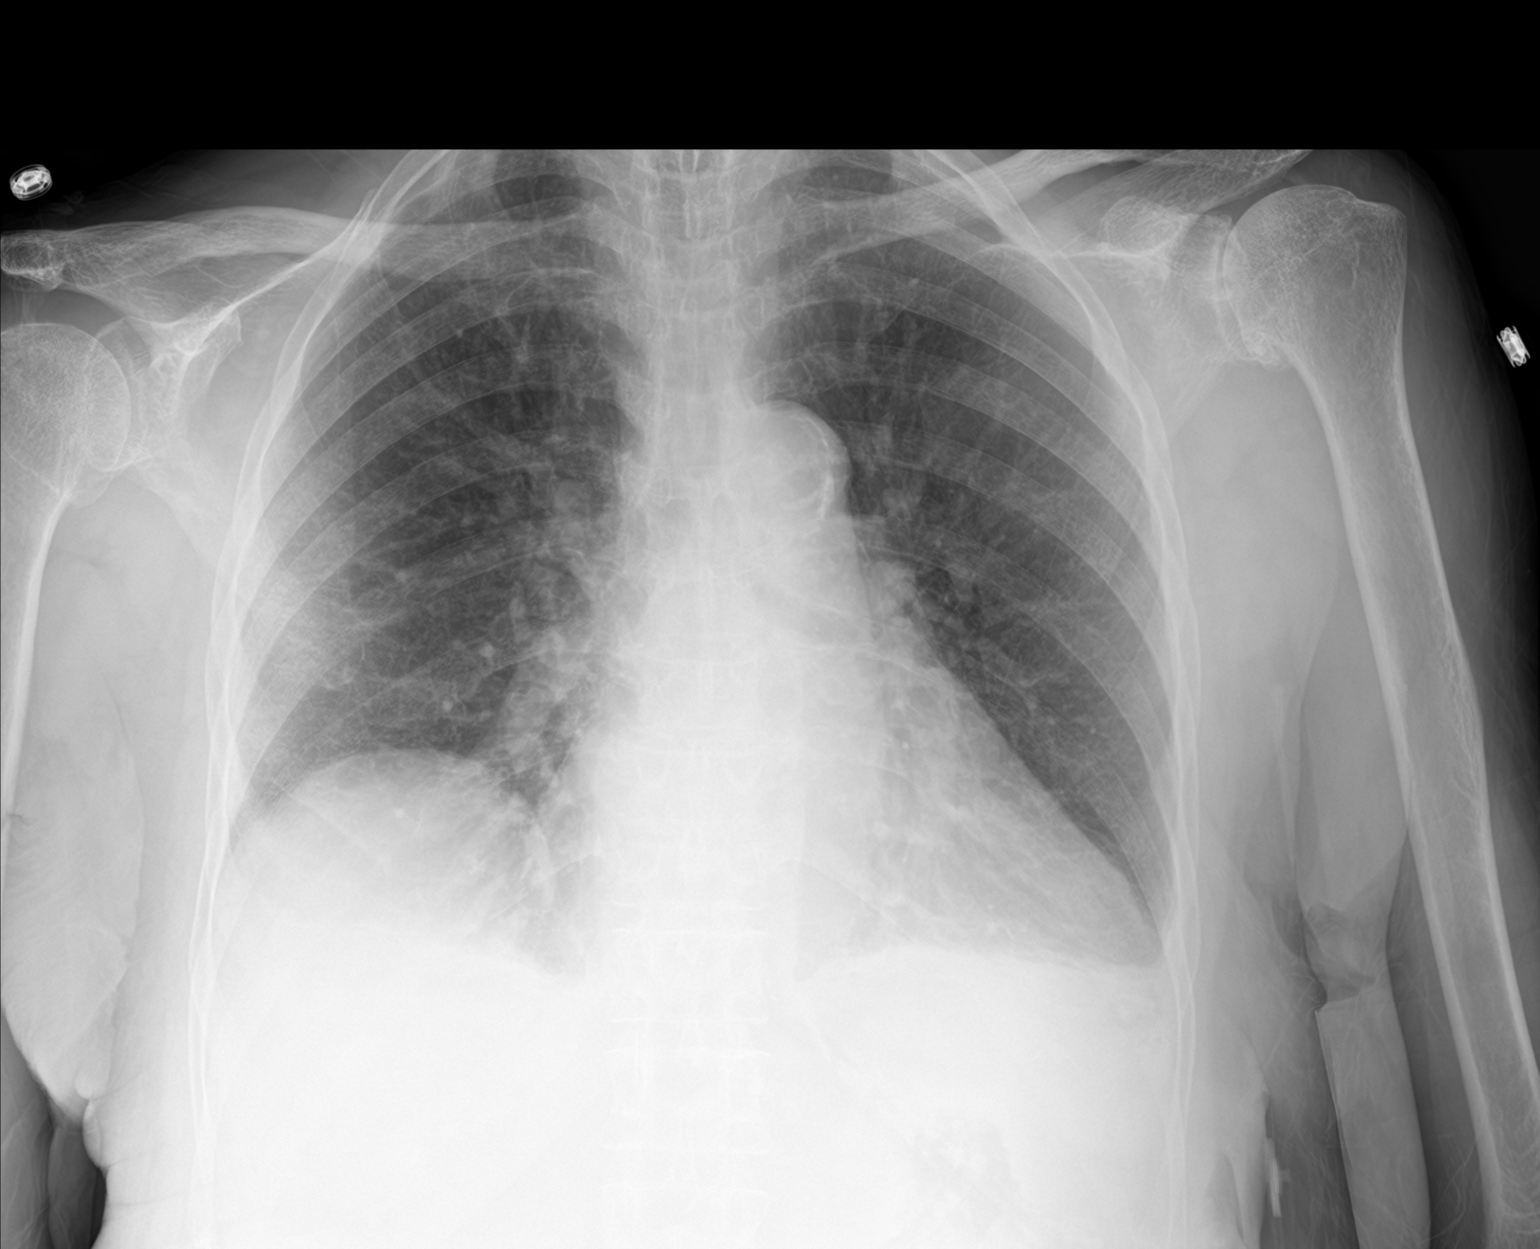

[2 of 2 positions shown; findings below may reference images not displayed]

FINDINGS: Cardiomediastinal silhouette is stable. There is chronic elevation
of the right hemidiaphragm. There is streaky left base retrocardiac
atelectasis or infiltrate best seen on lateral view. Mild
compression deformity lower thoracic spine of indeterminate age.
Clinical correlation is necessary.
IMPRESSION: There is streaky left base retrocardiac atelectasis or infiltrate
best seen on lateral view. Mild compression deformity lower thoracic
spine of indeterminate age. Clinical correlation is necessary.

## 2018-08-04 ENCOUNTER — Other Ambulatory Visit: Payer: Self-pay | Admitting: Nurse Practitioner

## 2018-08-05 ENCOUNTER — Other Ambulatory Visit: Payer: Self-pay

## 2018-08-05 ENCOUNTER — Ambulatory Visit (INDEPENDENT_AMBULATORY_CARE_PROVIDER_SITE_OTHER): Payer: Medicare Other | Admitting: Podiatry

## 2018-08-05 ENCOUNTER — Encounter: Payer: Self-pay | Admitting: Podiatry

## 2018-08-05 VITALS — Temp 97.3°F

## 2018-08-05 DIAGNOSIS — I70235 Atherosclerosis of native arteries of right leg with ulceration of other part of foot: Secondary | ICD-10-CM

## 2018-08-05 DIAGNOSIS — E0859 Diabetes mellitus due to underlying condition with other circulatory complications: Secondary | ICD-10-CM

## 2018-08-05 DIAGNOSIS — L97512 Non-pressure chronic ulcer of other part of right foot with fat layer exposed: Secondary | ICD-10-CM | POA: Diagnosis not present

## 2018-08-05 DIAGNOSIS — I739 Peripheral vascular disease, unspecified: Secondary | ICD-10-CM

## 2018-08-05 NOTE — Progress Notes (Signed)
HPI: 85 year old female presents the office today for evaluation of recurrent ulcerations that have developed to digits 2-4 of the right foot.  Patient underwent partial first ray amputation by vascular surgeon, Dr. Curt Jews, on 05/03/2018.  Patient is kept the dressings clean and dry until presentation today.  She is also been weightbearing minimally at home using her postoperative shoe.  No new complaints at this time  Past Medical History:  Diagnosis Date  . Acute respiratory failure ([place])   . Anemia    previous blood transfusions  . Arthritis    "all over"  . Asthma   . Bradycardia    requiring previous d/c of BB and reduction of amiodarone  . CAD (coronary artery disease)    nonobstructive per notes  . Chronic diastolic CHF (congestive heart failure) ([place])   . CKD (chronic kidney disease), stage III ([place])   . Complication of blood transfusion    "got the wrong blood type at [facility] in ~ 2015; no adverse reaction that we are aware of"/daughter, [name] (01/27/2016)  . COPD (chronic obstructive pulmonary disease) ([place])   . Depression    "light case"  . DVT (deep venous thrombosis) ([place]) 01/2016   a. LLE DVT 01/2016 - switched from Eliquis to Coumadin.  [name] Dyspnea    with some activity  . Gastric stenosis    a. s/p stomach tube  . GERD (gastroesophageal reflux disease)   . History of blood transfusion    "several" (01/27/2016)  . History of stomach ulcers   . Hyperlipidemia   . Hypertension   . Hypothyroidism   . On home oxygen therapy    "2L; 9PM til 9AM" (06/27/2017)  . Paraesophageal hernia   . Perforated gastric ulcer ([place])   . Peripheral vascular disease ([place])   . Pneumonia    "a few times" (06/27/2017)  . Seasonal allergies   . SIADH (syndrome of inappropriate ADH production) ([place])    [name] 01/10/2015  . Small bowel obstruction ([place])    "I don't know how many" (01/11/2015)  . Stroke [facility]    several- no residual  . Type II diabetes mellitus ([place])    "related to prednisone use  for > 20 yr; once predinose stopped; no more DM RX" (01/27/2016)  . UTI (urinary tract infection) 02/08/2016  . Ventral hernia with bowel obstruction      Physical Exam: General: The patient is alert and oriented x3 in no acute distress.  Dermatology: Skin is warm, dry and supple bilateral lower extremities.  Ulcers noted encompassing the entire digits 2-4 right foot.  Maceration noted.  Malodor noted.  Wound bases a combination of fibrotic and granular tissue.  Wounds measure approximately 1.01.0 x 0.3 cm to all the digits.  Vascular: Diminished but palpable pedal pulses bilaterally. No edema or erythema noted.   Neurological: Epicritic and protective threshold absent bilaterally.   Musculoskeletal Exam: Status post partial first ray amputation right foot  Assessment: 1.  Ulcer digit 2-4 right foot with fat layer exposed 2.  Diabetes mellitus 3.  Peripheral vascular disease 4.  Status post partial first ray amputation right   Plan of Care:  1. Patient evaluated. X-Rays reviewed.  2.  Medically necessary excisional debridement including subcutaneous tissue was performed using a tissue nipper.  Excisional debridement of all necrotic nonviable tissue down to healthy bleeding viable tissue was performed with post debridement measurement same as pre- 3.  Patient improved significantly since last visit 1 week ago.  Collagen  and silver absorbant dressings applied.  Keep clean dry and intact x1 week.  Continue weightbearing in postoperative shoe. 4.  Return to clinic in 1 week     Edrick Kins, DPM [facility]  Dr. Edrick Kins, DPM    [address], [place] [address]                Office [phone]  Fax [phone]

## 2018-08-06 ENCOUNTER — Other Ambulatory Visit: Payer: Self-pay | Admitting: *Deleted

## 2018-08-06 IMAGING — CR DG CHEST 1V PORT SAME DAY
1 series · 1 of 1 positions shown · non-contrast
Comparison: 03/02/2016

CLINICAL DATA: CHF

EXAM:
PORTABLE CHEST 1 VIEW

[AP]
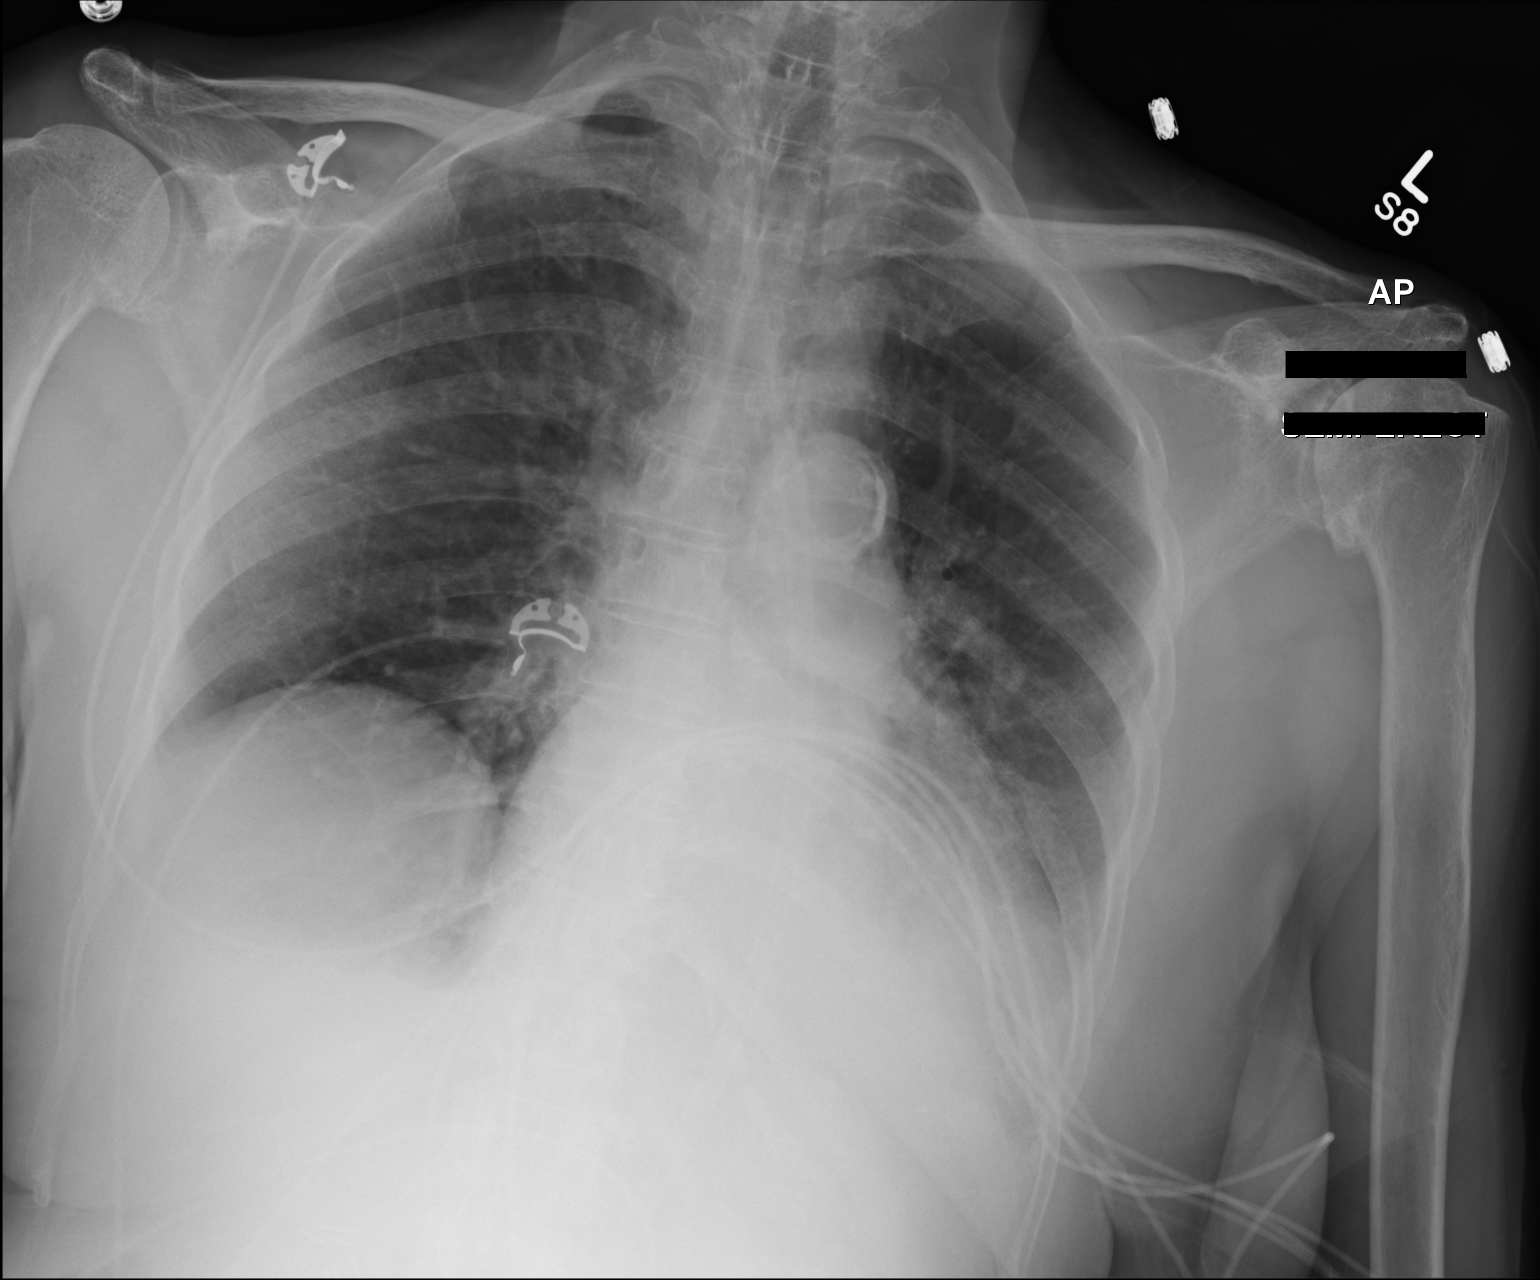

[1 of 1 positions shown; findings below may reference images not displayed]

FINDINGS: Stable cardiomegaly without definite CHF. Chronic elevation of the
right hemidiaphragm. Increased consolidative left lower lobe opacity
obscures the left cardiac border and the left hemidiaphragm
suspicious for developing left lower lobe collapse/ consolidation.
Pneumonia not excluded. No pneumothorax. Trachea is midline.
Atherosclerosis changes of the aorta. Degenerative changes of the
spine and shoulders.
IMPRESSION: Stable cardiomegaly without definite edema or CHF

Increased left lower lobe collapse/consolidation concerning for
developing pneumonia

Thoracic aortic atherosclerosis

## 2018-08-06 NOTE — Patient Outreach (Signed)
[facility]) Care Management  08/06/2018  CHENOAH MCNALLY [birth year] [id number]   [place] referral  Outreach: 08/06/2018   RN spoke with pt's daughter [name] (primary caregiver) who indicates pt is not able to converse. RN introduced the [facility] program and services and inquired if this was a good time to talk. Daughter requested a call back tomorrow. RN will scheduled a follow up call on tomorrow and further inquired on pt's possible needs.   [name], RN Care Management Coordinator [place] Office [phone]

## 2018-08-07 ENCOUNTER — Encounter: Payer: Self-pay | Admitting: *Deleted

## 2018-08-07 ENCOUNTER — Other Ambulatory Visit: Payer: Self-pay | Admitting: *Deleted

## 2018-08-07 IMAGING — DX DG CHEST 2V
2 series · 2 of 2 positions shown · non-contrast
Comparison: Portable chest x-ray March 05, 2016

CLINICAL DATA: Shortness of breath with cough and chest congestion
today. History of asthma -COPD, coronary artery disease, nonsmoker.

EXAM:
CHEST  2 VIEW

[w chest lat]
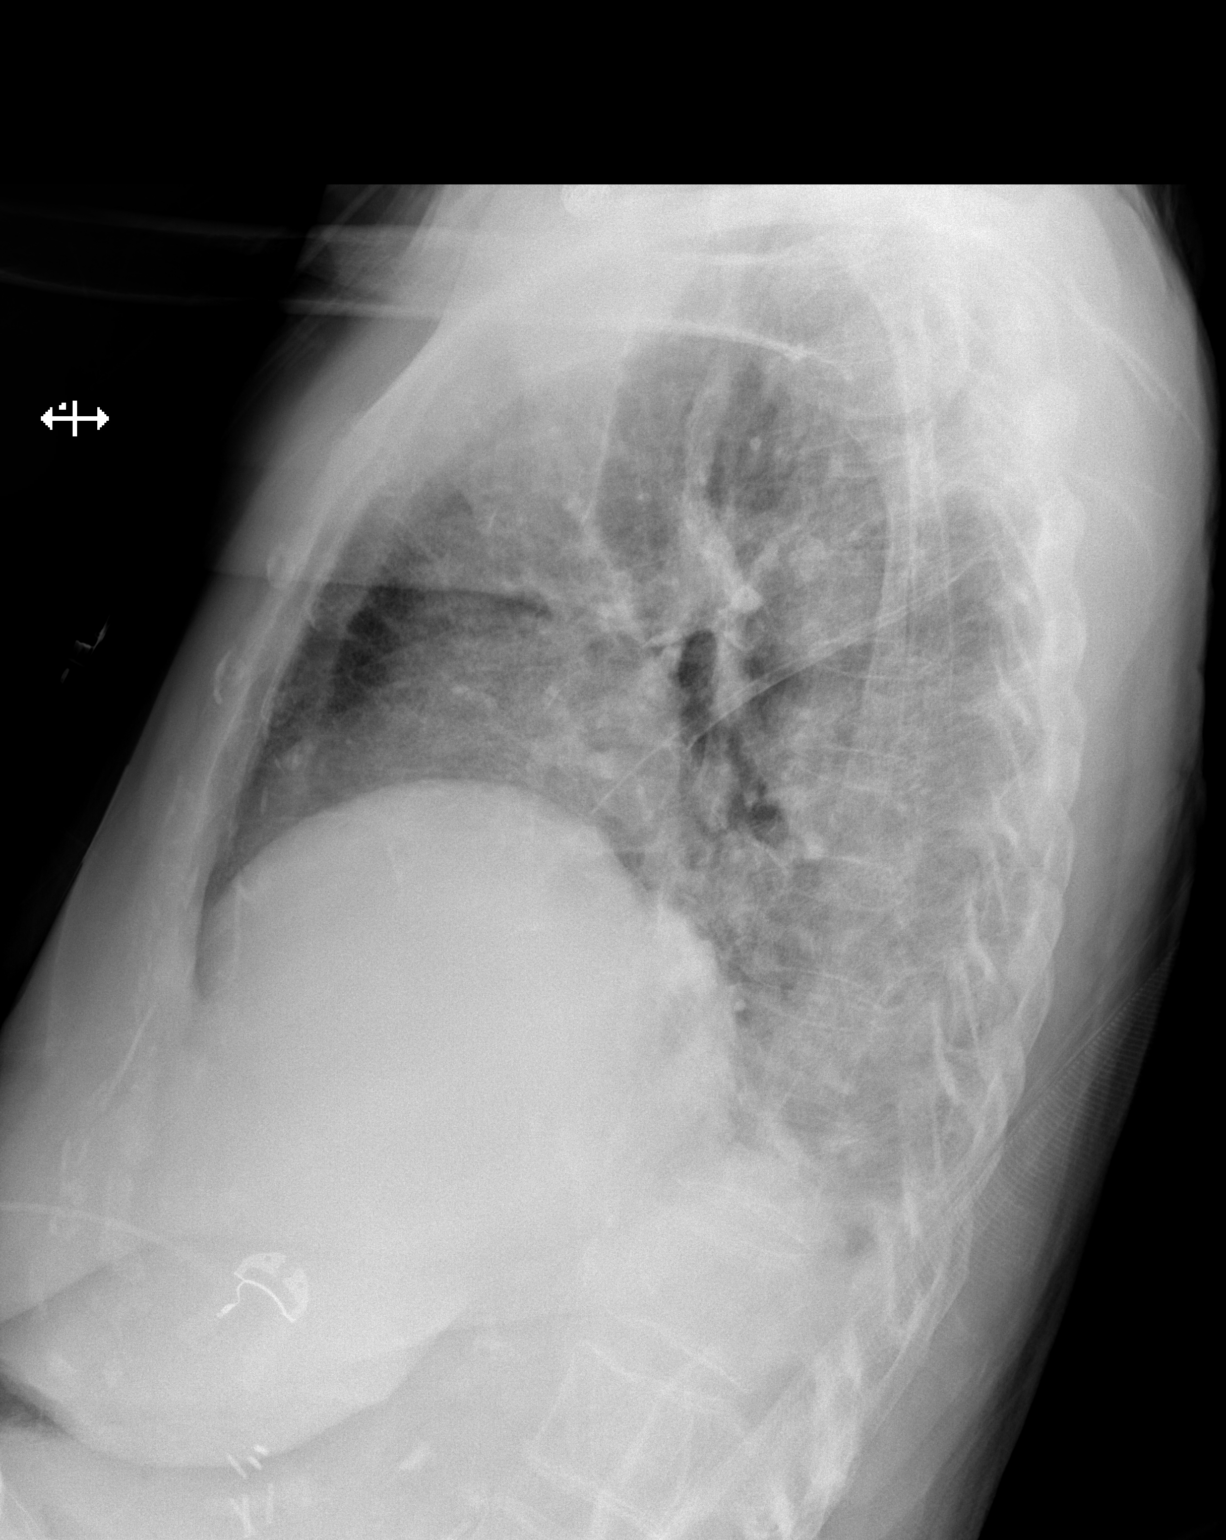

[x chest ap]
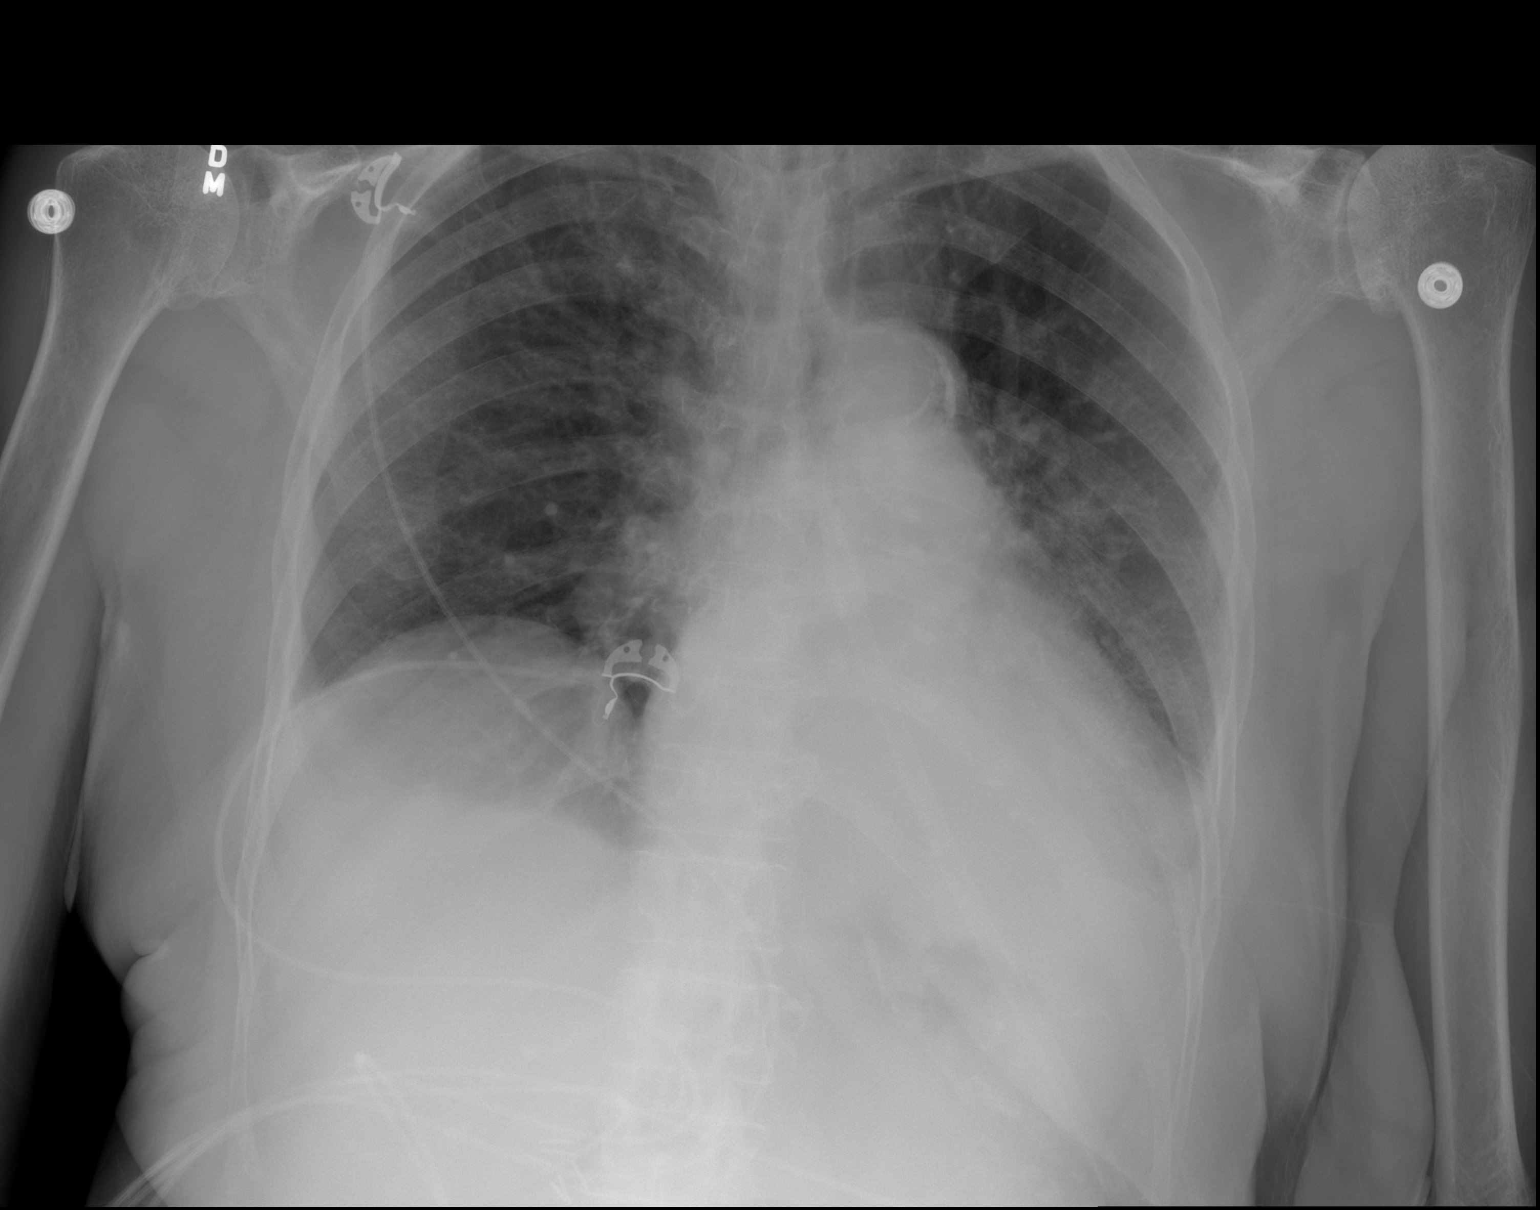

[2 of 2 positions shown; findings below may reference images not displayed]

FINDINGS: The lungs are reasonably well inflated. The cardiac silhouette
remains enlarged. The central pulmonary vascularity is mildly
prominent and slightly more conspicuous than on yesterday's study.
There is calcification in the wall of the aortic arch. The
mediastinum is normal in width. There is no significant pleural
effusion. The observed bony thorax is unremarkable.
IMPRESSION: Chronic bronchitic changes. Probable superimposed low-grade CHF. No
discrete pneumonia.

Thoracic aortic atherosclerosis.

## 2018-08-07 NOTE — Progress Notes (Signed)
HPI: 85 year old female presents the office today for follow-up evaluation regarding ulcerations to digits 2-4 of the right foot.  Patient has been wearing the postoperative shoe as directed.  Patient discontinued taking doxycycline due to nausea and vomiting.  Patient states that she feels like the toes are not getting any better.  She presents for further treatment evaluation  Past Medical History:  Diagnosis Date  . Acute respiratory failure ([place])   . Anemia    previous blood transfusions  . Arthritis    "all over"  . Asthma   . Bradycardia    requiring previous d/c of BB and reduction of amiodarone  . CAD (coronary artery disease)    nonobstructive per notes  . Chronic diastolic CHF (congestive heart failure) ([place])   . CKD (chronic kidney disease), stage III ([place])   . Complication of blood transfusion    "got the wrong blood type at [facility] in ~ 2015; no adverse reaction that we are aware of"/daughter, [name] (01/27/2016)  . COPD (chronic obstructive pulmonary disease) ([place])   . Depression    "light case"  . DVT (deep venous thrombosis) ([place]) 01/2016   a. LLE DVT 01/2016 - switched from Eliquis to Coumadin.  [name] Dyspnea    with some activity  . Gastric stenosis    a. s/p stomach tube  . GERD (gastroesophageal reflux disease)   . History of blood transfusion    "several" (01/27/2016)  . History of stomach ulcers   . Hyperlipidemia   . Hypertension   . Hypothyroidism   . On home oxygen therapy    "2L; 9PM til 9AM" (06/27/2017)  . Paraesophageal hernia   . Perforated gastric ulcer ([place])   . Peripheral vascular disease ([place])   . Pneumonia    "a few times" (06/27/2017)  . Seasonal allergies   . SIADH (syndrome of inappropriate ADH production) ([place])    [name] 01/10/2015  . Small bowel obstruction ([place])    "I don't know how many" (01/11/2015)  . Stroke [facility])    several- no residual  . Type II diabetes mellitus ([place])    "related to prednisone use  for > 20 yr; once  predinose stopped; no more DM RX" (01/27/2016)  . UTI (urinary tract infection) 02/08/2016  . Ventral hernia with bowel obstruction      Physical Exam: General: The patient is alert and oriented x3 in no acute distress.  Dermatology: Skin is warm, dry and supple bilateral lower extremities.  Ulcers noted encompassing the entire digits 2-4 right foot.  Maceration noted.  Malodor noted.  Wound bases a combination of fibrotic and granular tissue.  Wounds measure approximately 1.5 x 1.5 x 0.3 cm to all the digits.  Vascular: Diminished but palpable pedal pulses bilaterally. No edema or erythema noted.   Neurological: Epicritic and protective threshold absent bilaterally.   Musculoskeletal Exam: Status post partial first ray amputation right foot  Assessment: 1.  Ulcer digit 2-4 right foot with fat layer exposed 2.  Diabetes mellitus 3.  Peripheral vascular disease 4.  Status post partial first ray amputation right   Plan of Care:  1. Patient evaluated.  2.  Medically necessary excisional debridement including subcutaneous tissue was performed using a tissue nipper.  Excisional debridement of all necrotic nonviable tissue down to healthy bleeding viable tissue was performed with post debridement measurement same as pre- 3.  Today we dressed the foot with collagen and silver absorbent dressing with instructions to leave the dressings clean dry  and intact x1 week. 4.  Continue wearing postoperative shoe 5.  Return to clinic in 1 week     Edrick Kins, DPM [facility]  Dr. Edrick Kins, DPM    [address], [place] [address]                Office [phone]  Fax [phone]

## 2018-08-07 NOTE — Patient Outreach (Addendum)
[facility]) Care Management  08/07/2018  Shannon [name] February 24, 1924 [id number]    NEXGEN-Enrolled-HF Outreach:08/07/2018  RN spoke with pt and verified identifier and received permission to speak with her daughter Shannon [name] as pt on speaker to assess with today's assessment. Note pt is HOH even with her hearing aides at time.  RN introduced [facility] programs and purpose of today's call. Both receptive to the conversation and information provided. Daughter and pt discussed the pt's current issues related:  DM- daughter indicates pt currently receiving tube feeds with Osmolite and this has been very difficult in managing her diabetes with the ever changing feeds. States they has tried Ensure but this did not work. Reports pt's CBGs range from 150-192 with this AM at 192 however this maybe related to recent antibiotics and surgery with toe amputations. Pt has lost weight from 155 lbs now at 141 lbs and can only tolerate some oral medication, jello and pudding at times. Pt also has a current infections around her feeding tube and received nystatin powder.Pt also pending [facility] for wound care to her recent amputated toes with a current infection. RN educated pt/caregiver that this may also affect her daily CBG readings due to an infection she is current receiving treatment.  HTN-Caregiver daughter reports pt's BP is controlled as pt obtains daily BP readings with her last read at 123/50-80 on her home device. RN inquired on pt's understanding of HTN and discuss possible signs and symptoms that she may incur however this medical condition is known at the "silent killer" and pt may not encounter any symptoms that can lead acute events. Pt/caregiver is aware of what to do if the condition becomes acute. Decline printed material on this condition.  COPD- Daughter reports pt use her nebulizer PRN and her scheduled inhalers as recommended. States pt has this Agricultural engineer very well with no acute  symptoms. RN reviewed pt's COPD medications (nebs and inhalers) as pt has a clear understanding on these medications. No needs presented.  Atrial Fibrillation- daughter reports pt had experienced this years ago however no additional issues have occurred since that time. This is a condition the pt's doctor continue to monitor.  CHF-Pt/Daughter was not aware of the HF zones. RN educated on the HF zones and explained the symptoms of all zones. Stress the importance of pt remaining in the GREEN zones and verified pt does not have any swelling, no congestions or extra secretions and no fluid retention as this was discussed in detail. Strongly encouraged pt/caregiver to contact pt's provider if pt gains 3 lbs over night or 5 lbs within one week with any of the discussed symptoms of SOB or swelling to the trunk, hands or LE. Will provider examples and discuss a action plan on what the caregiver and/or pt should do if acute symptoms occur.    Based upon the lack of knowledge and daily monitoring required RN will discuss possible enrollment into the HF program and offer to follow up monthly with goals and interventions pt needs to adhere to in lowering her risk of acute HF. Both pt and her daughter verbalized an understanding and pt has agreed to the plan of care. Both alerted that the pt's provider will be notified of her disposition with [facility] and enrollment into the program. Will plan to follow up in one month with ongoing case management services and update plan of care accordingly based upon pt's progress. Will also send emmi printable information to this pt for ongoing review  on HF zones and how to manage this condition.   [name] CM Care Plan Problem One     Most Recent Value  Care Plan Problem One  Knowledge deficit related to HF  Role Documenting the Problem One  Care Management Coordinator  Care Plan for Problem One  Active  [name] Long Term Goal   Pt/fx will have an increased knowledge of HF management and what  to do if acute symptoms should occurr with the next 90 days.  [name] Long Term Goal Start Date  08/07/18  Interventions for Problem One Long Term Goal  Will educate on HF management and what to do ifa cute symptoms occur. Will also discuss an action plan on what to do if acute symptoms occur.   [name] CM Short Term Goal #1   Pt will stay in the GREEN zone with no acute symptoms over the next 30 days.  [name] CM Short Term Goal #1 Start Date  08/07/18  Interventions for Short Term Goal #1  Will educate on the HF zones and stress the importance of remaining in the GREEN zone and what to do symptoms occur inthe YELLOW or RED zone. Will send EMMI printed information on HF zones and HF management.  [name] CM Short Term Goal #2   Pt will complete daily weights and monitor any fluid retention based upon parameters discussed today to her provider in the next 30 days.  [name] CM Short Term Goal #2 Start Date  08/07/18  Interventions for Short Term Goal #2  Will educate on HF zones with weight gained of 3 lbs or 5 lbs within a week to contact her provider for further interventions. Will strongly encouraged pt to recognize acute symptoms and what to do. Will continue to stress the importance of daily weights and report any abnormal readings. Shannon Cousin, RN Care Management Coordinator [facility] Main Office [phone]

## 2018-08-11 IMAGING — CR DG CHEST 1V PORT
1 series · 1 of 1 positions shown · non-contrast
Comparison: 03/06/2016

CLINICAL DATA: Cough for several days with left-sided chest pain,
initial encounter

EXAM:
PORTABLE CHEST 1 VIEW

[AP]
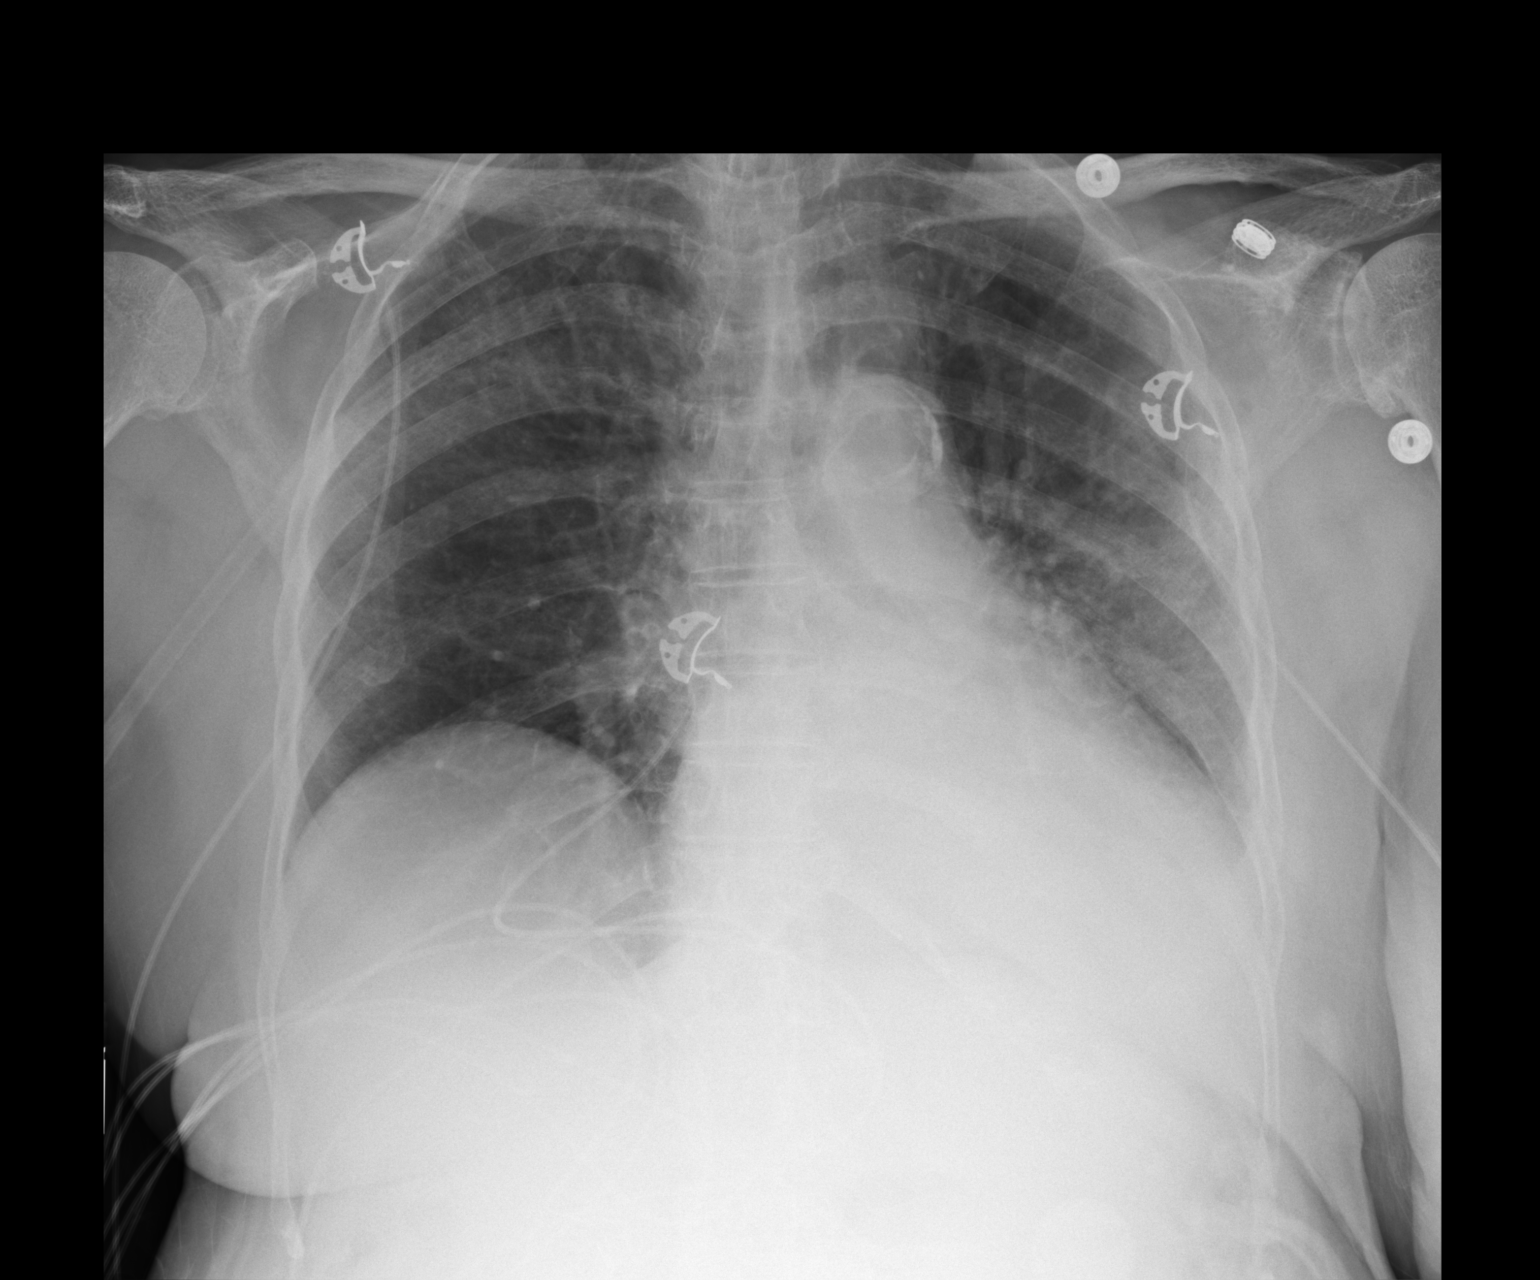

[1 of 1 positions shown; findings below may reference images not displayed]

FINDINGS: Cardiac shadow is mildly enlarged but stable. Increasing left lower
lobe consolidation is noted with associated effusion. No other focal
infiltrate is noted. No bony abnormality is seen.
IMPRESSION: Left lower lobe pneumonia with associated effusion.

## 2018-08-12 ENCOUNTER — Ambulatory Visit (INDEPENDENT_AMBULATORY_CARE_PROVIDER_SITE_OTHER): Payer: Medicare Other | Admitting: Podiatry

## 2018-08-12 ENCOUNTER — Other Ambulatory Visit: Payer: Self-pay

## 2018-08-12 ENCOUNTER — Encounter: Payer: Self-pay | Admitting: Podiatry

## 2018-08-12 VITALS — Temp 97.7°F

## 2018-08-12 DIAGNOSIS — L97512 Non-pressure chronic ulcer of other part of right foot with fat layer exposed: Secondary | ICD-10-CM | POA: Diagnosis not present

## 2018-08-12 DIAGNOSIS — I70235 Atherosclerosis of native arteries of right leg with ulceration of other part of foot: Secondary | ICD-10-CM

## 2018-08-12 DIAGNOSIS — E0859 Diabetes mellitus due to underlying condition with other circulatory complications: Secondary | ICD-10-CM

## 2018-08-12 NOTE — Progress Notes (Signed)
HPI: 85 year old female presents the office today for evaluation of recurrent ulcerations that have developed to digits 2-4 of the right foot.  Patient underwent partial first ray amputation by vascular surgeon, Dr. Curt Jews, on 05/03/2018.  Patient is kept the dressings clean and dry until presentation today.  Last visit on 08/05/2018 dressings were applied with instructions to leave clean dry and intact x1 week.  She has been minimally weightbearing in a postoperative shoe.  Past Medical History:  Diagnosis Date  . Acute respiratory failure ([place])   . Anemia    previous blood transfusions  . Arthritis    "all over"  . Asthma   . Bradycardia    requiring previous d/c of BB and reduction of amiodarone  . CAD (coronary artery disease)    nonobstructive per notes  . Chronic diastolic CHF (congestive heart failure) ([place])   . CKD (chronic kidney disease), stage III ([place])   . Complication of blood transfusion    "got the wrong blood type at [facility] in ~ 2015; no adverse reaction that we are aware of"/daughter, [name] (01/27/2016)  . COPD (chronic obstructive pulmonary disease) ([place])   . Depression    "light case"  . DVT (deep venous thrombosis) ([place]) 01/2016   a. LLE DVT 01/2016 - switched from Eliquis to Coumadin.  [name] Dyspnea    with some activity  . Gastric stenosis    a. s/p stomach tube  . GERD (gastroesophageal reflux disease)   . History of blood transfusion    "several" (01/27/2016)  . History of stomach ulcers   . Hyperlipidemia   . Hypertension   . Hypothyroidism   . On home oxygen therapy    "2L; 9PM til 9AM" (06/27/2017)  . Paraesophageal hernia   . Perforated gastric ulcer ([place])   . Peripheral vascular disease ([place])   . Pneumonia    "a few times" (06/27/2017)  . Seasonal allergies   . SIADH (syndrome of inappropriate ADH production) ([place])    [name] 01/10/2015  . Small bowel obstruction ([place])    "I don't know how many" (01/11/2015)  . Stroke [facility])    several- no  residual  . Type II diabetes mellitus ([place])    "related to prednisone use  for > 20 yr; once predinose stopped; no more DM RX" (01/27/2016)  . UTI (urinary tract infection) 02/08/2016  . Ventral hernia with bowel obstruction      Physical Exam: General: The patient is alert and oriented x3 in no acute distress.  Dermatology: Skin is warm, dry and supple bilateral lower extremities.  Ulcers noted encompassing the entire digits 2-4 right foot.  Maceration noted.  Malodor noted.  Wound bases a combination of fibrotic and granular tissue.  Wounds measure approximately 1.01.0 x 0.3 cm to all the digits.  Vascular: Diminished but palpable pedal pulses bilaterally. No edema or erythema noted.   Neurological: Epicritic and protective threshold absent bilaterally.   Musculoskeletal Exam: Status post partial first ray amputation right foot  Assessment: 1.  Ulcer digit 2-4 right foot with fat layer exposed 2.  Diabetes mellitus 3.  Peripheral vascular disease 4.  Status post partial first ray amputation right   Plan of Care:  1. Patient evaluated. 2.  Medically necessary excisional debridement including subcutaneous tissue was performed using a tissue nipper.  Excisional debridement of all necrotic nonviable tissue down to healthy bleeding viable tissue was performed with post debridement measurement same as pre- 3.  Today we discussed with the  patient and the patient's daughter options regarding management of the wounds.  The ulcerations are chronic on the tips of the toes with likely some osteomyelitis to the distal phalanx of the digits as well.  This is obviously chronic and stable at the moment. 4.  Surgery would consist of transmetatarsal amputation.  The patient is 85 years old and I would like to avoid surgery, however we have been doing wound care on these toes for approximately 1 year now without any significant improvement.  Wounds are chronic and stable.   I discussed the treatment  options with the patient today and we will think about it.  5.  Recommend Betadine with dry sterile dressing every other day 6.  Return to clinic in 2 weeks to decide on how to proceed with the patient's ulcers.   Edrick Kins, DPM [facility]  Dr. Edrick Kins, DPM    [address], [place] [address]                Office [phone]  Fax [phone]

## 2018-08-14 ENCOUNTER — Ambulatory Visit: Payer: Medicare Other | Admitting: Nurse Practitioner

## 2018-08-14 ENCOUNTER — Encounter: Payer: Self-pay | Admitting: Physician Assistant

## 2018-08-14 ENCOUNTER — Telehealth: Payer: Self-pay | Admitting: Physician Assistant

## 2018-08-14 IMAGING — CR DG ABDOMEN 1V
1 series · 1 of 1 positions shown · non-contrast
Comparison: 02/08/2016, 01/03/2016

CLINICAL DATA: Evaluate gastrojejunostomy tube for proper
positioning

EXAM:
ABDOMEN - 1 VIEW

[AP]
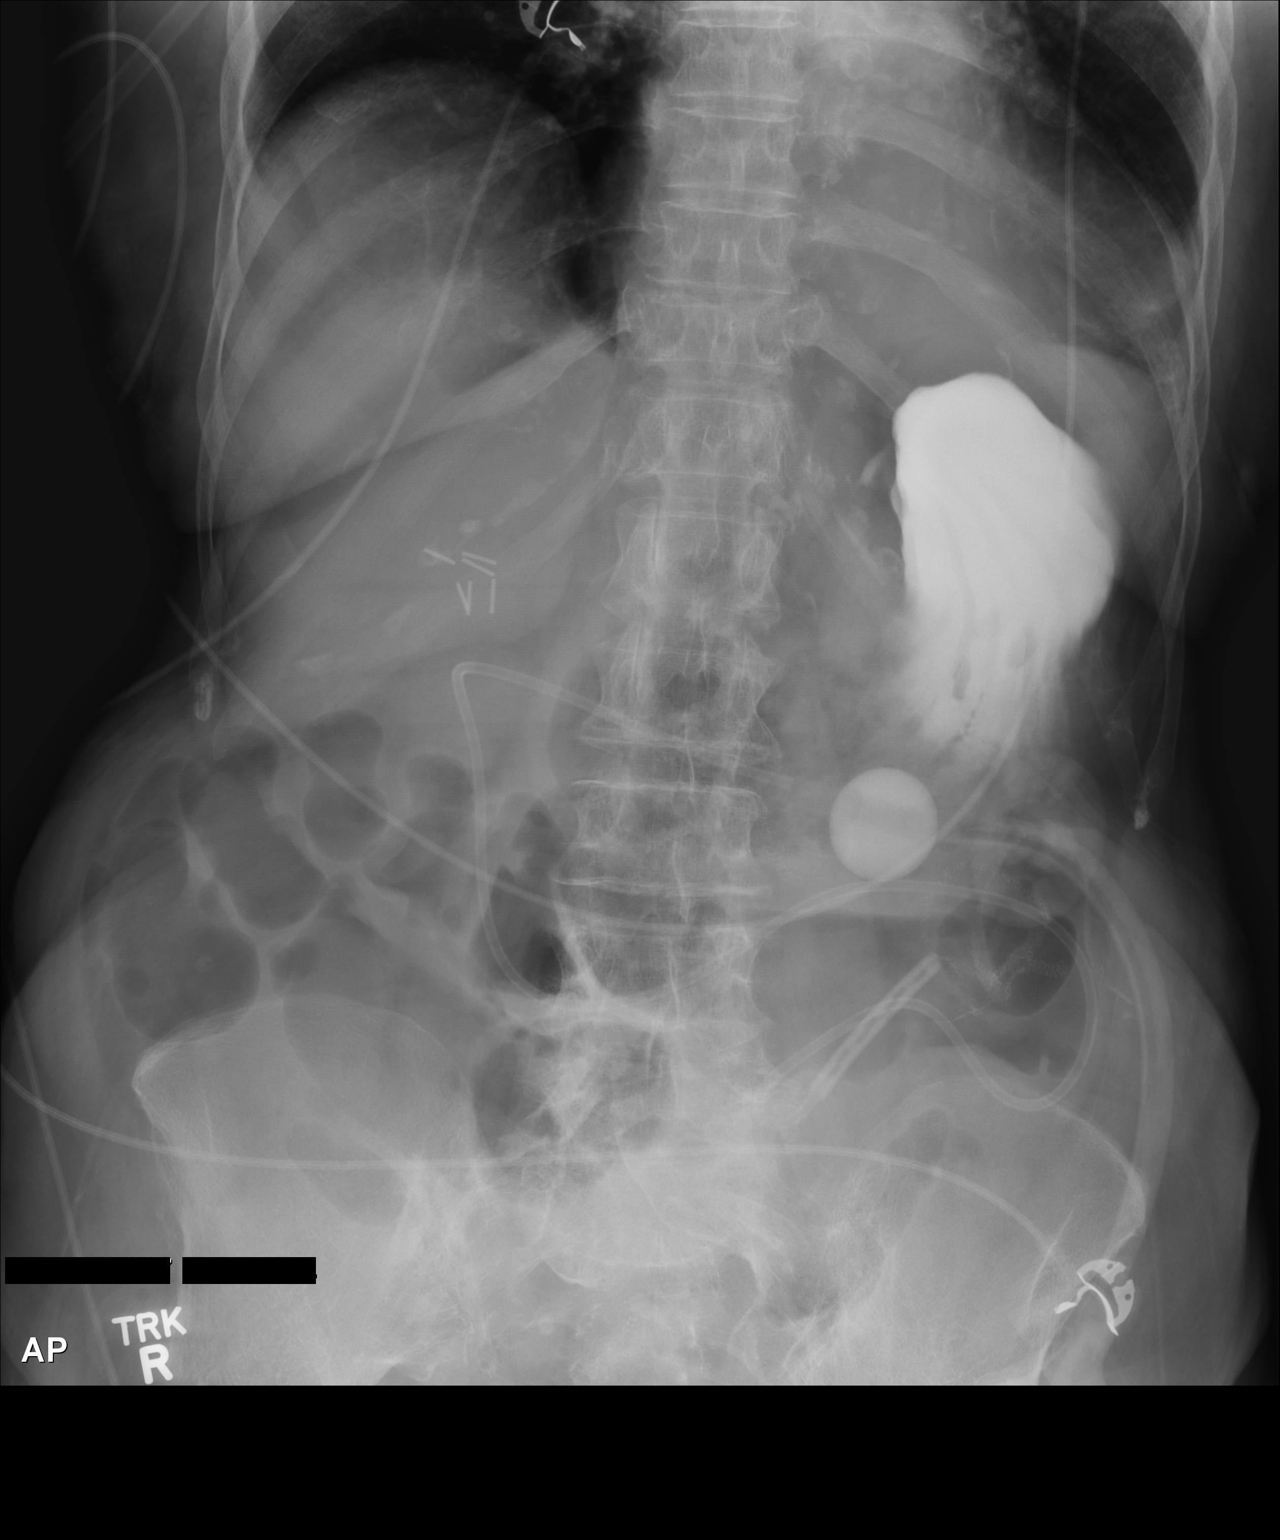

[1 of 1 positions shown; findings below may reference images not displayed]

FINDINGS: Contrast is noted within the proximal stomach with GJ tube balloon
noted in the body of the stomach. The jejunostomy component is seen
coursing along the expected course the duodenum and into proximal
jejunum, tip in the left lower quadrant. Chain sutures are also seen
in the left lower quadrant. Cholecystectomy clips are seen in the
right upper quadrant. Chronic T11 superior endplate compression.
IMPRESSION: GJ tube noted as above described with gastric component in the body
of the stomach and tip of jejunal component in the left lower
quadrant in the expected course of proximal jejunum.

## 2018-08-14 NOTE — Telephone Encounter (Signed)
Patient set up for MyChart? Yes - but they dont use it - daughter Shannon [name] gave consent for patient   Is patient using Smartphone/computer/tablet? no  Did audio/video work? no  Does patient need telephone visit?yes  Best phone number to use? [phone] daughter Shannon [name]  Special Instructions? Patient will have vitals ready       Virtual Visit Pre-Appointment Phone Call  "(Name), I am calling you today to discuss your upcoming appointment. We are currently trying to limit exposure to the virus that causes COVID-19 by seeing patients at home rather than in the office."  1. "What is the BEST phone number to call the day of the visit?" - include this in appointment notes  2. Do you have or have access to (through a family member/friend) a smartphone with video capability that we can use for your visit?" a. If yes - list this number in appt notes as cell (if different from BEST phone #) and list the appointment type as a VIDEO visit in appointment notes b. If no - list the appointment type as a PHONE visit in appointment notes  3. Confirm consent - "In the setting of the current Covid19 crisis, you are scheduled for a (phone or video) visit with your provider on (date) at (time).  Just as we do with many in-office visits, in order for you to participate in this visit, we must obtain consent.  If you'd like, I can send this to your mychart (if signed up) or email for you to review.  Otherwise, I can obtain your verbal consent now.  All virtual visits are billed to your insurance company just like a normal visit would be.  By agreeing to a virtual visit, we'd like you to understand that the technology does not allow for your provider to perform an examination, and thus may limit your provider's ability to fully assess your condition. If your provider identifies any concerns that need to be evaluated in person, we will make arrangements to do so.  Finally, though the technology is pretty good, we  cannot assure that it will always work on either your or our end, and in the setting of a video visit, we may have to convert it to a phone-only visit.  In either situation, we cannot ensure that we have a secure connection.  Are you willing to proceed?" STAFF: Did the patient verbally acknowledge consent to telehealth visit? Document YES/NO here: yes  4. Advise patient to be prepared - "Two hours prior to your appointment, go ahead and check your blood pressure, pulse, oxygen saturation, and your weight (if you have the equipment to check those) and write them all down. When your visit starts, your provider will ask you for this information. If you have an Apple Watch or Kardia device, please plan to have heart rate information ready on the day of your appointment. Please have a pen and paper handy nearby the day of the visit as well."  5. Give patient instructions for MyChart download to smartphone OR [email or web address] as below if video visit (depending on what platform provider is using)  6. Inform patient they will receive a phone call 15 minutes prior to their appointment time (may be from unknown caller ID) so they should be prepared to answer    TELEPHONE CALL NOTE  Shannon [name] has been deemed a candidate for a follow-up tele-health visit to limit community exposure during the Covid-19 pandemic. I spoke with the patient via phone to ensure  availability of phone/video source, confirm preferred email & phone number, and discuss instructions and expectations.  I reminded Shannon [name] to be prepared with any vital sign and/or heart rhythm information that could potentially be obtained via home monitoring, at the time of her visit. I reminded Shannon [name] to expect a phone call prior to her visit.  Shannon [name] 08/14/2018 8:40 AM   INSTRUCTIONS FOR DOWNLOADING THE MYCHART APP TO SMARTPHONE  - The patient must first make sure to have activated MyChart and know their login information - If  Apple, go to [employer] and type in MyChart in the search bar and download the app. If Android, ask patient to go to [employer] and type in [place] in the search bar and download the app. The app is free but as with any other app downloads, their phone may require them to verify saved payment information or Apple/Android password.  - The patient will need to then log into the app with their MyChart username and password, and select [facility] as their healthcare provider to link the account. When it is time for your visit, go to the MyChart app, find appointments, and click Begin Video Visit. Be sure to Select Allow for your device to access the Microphone and Camera for your visit. You will then be connected, and your provider will be with you shortly.  **If they have any issues connecting, or need assistance please contact MyChart service desk [phone])**  **If using a computer, in order to ensure the best quality for their visit they will need to use either of the following Internet Browsers: [employer], or Google Chrome**  IF USING DOXIMITY or DOXY.ME - The patient will receive a link just prior to their visit by text.     FULL LENGTH CONSENT FOR TELE-HEALTH VISIT   I hereby voluntarily request, consent and authorize [facility] and its employed or contracted physicians, physician assistants, nurse practitioners or other licensed health care professionals (the Practitioner), to provide me with telemedicine health care services (the Services") as deemed necessary by the treating Practitioner. I acknowledge and consent to receive the Services by the Practitioner via telemedicine. I understand that the telemedicine visit will involve communicating with the Practitioner through live audiovisual communication technology and the disclosure of certain medical information by electronic transmission. I acknowledge that I have been given the opportunity to request an  in-person assessment or other available alternative prior to the telemedicine visit and am voluntarily participating in the telemedicine visit.  I understand that I have the right to withhold or withdraw my consent to the use of telemedicine in the course of my care at any time, without affecting my right to future care or treatment, and that the Practitioner or I may terminate the telemedicine visit at any time. I understand that I have the right to inspect all information obtained and/or recorded in the course of the telemedicine visit and may receive copies of available information for a reasonable fee.  I understand that some of the potential risks of receiving the Services via telemedicine include:   Delay or interruption in medical evaluation due to technological equipment failure or disruption;  Information transmitted may not be sufficient (e.g. poor resolution of images) to allow for appropriate medical decision making by the Practitioner; and/or   In rare instances, security protocols could fail, causing a breach of personal health information.  Furthermore, I acknowledge that it is my responsibility to provide information  about my medical history, conditions and care that is complete and accurate to the best of my ability. I acknowledge that Practitioner's advice, recommendations, and/or decision may be based on factors not within their control, such as incomplete or inaccurate data provided by me or distortions of diagnostic images or specimens that may result from electronic transmissions. I understand that the practice of medicine is not an exact science and that Practitioner makes no warranties or guarantees regarding treatment outcomes. I acknowledge that I will receive a copy of this consent concurrently upon execution via email to the email address I last provided but may also request a printed copy by calling the office of [facility].    I understand that my insurance will be  billed for this visit.   I have read or had this consent read to me.  I understand the contents of this consent, which adequately explains the benefits and risks of the Services being provided via telemedicine.   I have been provided ample opportunity to ask questions regarding this consent and the Services and have had my questions answered to my satisfaction.  I give my informed consent for the services to be provided through the use of telemedicine in my medical care  By participating in this telemedicine visit I agree to the above.

## 2018-08-14 NOTE — Progress Notes (Addendum)
Virtual Visit via Telephone Note   This visit type was conducted due to national recommendations for restrictions regarding the COVID-19 Pandemic (e.g. social distancing) in an effort to limit this patient's exposure and mitigate transmission in our community.  Due to her co-morbid illnesses, this patient is at least at moderate risk for complications without adequate follow up.  This format is felt to be most appropriate for this patient at this time.  The patient did not have access to video technology/had technical difficulties with video requiring transitioning to audio format only (telephone).  All issues noted in this document were discussed and addressed.  No physical exam could be performed with this format.  Please refer to the patient's chart for her  consent to telehealth for Shannon [name].   Date:  08/15/2018   ID:  Shannon [name], DOB 12-05-1923, MRN [id number]  Patient Location: Home Provider Location: Home  PCP:  Shannon Seller, NP  Cardiologist:  Shannon Alexander, MD  Electrophysiologist:  Shannon [name]   Evaluation Performed:  Follow-Up Visit  Chief Complaint:  Overdue follow-up of atrial arrhythmias, CHF  History of Present Illness:    Shannon [name] is a 85 y.o. female with paroxysmal (previously persistent) atrial fibrillation/flutter, bradycardia, CKD stage III, hypothyroidism,non-obstructive coronary artery disease, type 2 diabetes, hypertension, chronic diastolic CHF, stroke, DVT, cerebrovascular disease, COPD, asthma, gastric outlet obstruction s/p prior J tube, anemia, lower extremity PAD (followed by [facility]) s/p prior bilateral interventions, gangrene s/p toe amputations who presents for tele-health follow-up.  She has very complex history, with numerous admissions in 2017-2019 for various issues including including for symptomatic bradycardia alternating with atrial flutter and hypoxia, J tube obstruction, sepsis, AKI on CKD, DVT, gangrene requiring  amputations, pneumonia, COPD exacerbations, pressure ulcer and goals of care conversations. In 2018, amiodarone was stopped due to goal of rate control only and concern for pulmonary toxicity. Given her GFR<30, hematology previously recommended Coumadin instead of Xarelto. Last echo 05/2016 showed EF 60-65%, mild LAE, moderate RAE, moderate TR, mildly increased PASP. Last labs 05/2018 showed BUN 67, Cr 1.42, glucose 35 (prompting water flushes and diabetic med changes), K 4.5, Na 141, albumin 3.4, Hgb 8.8 (similar to prior).  She is seen today virtually with her daughter Shannon [name] assisting in the conversation. From cardiac standpoint she's actually been doing quite well without any CP, SOB, edema, orthopnea or weight gain. Her biggest issues have stemmed from her GOO and J tube requiring IR intervention. Despite these measures along with what sounds like a course of Nystatin, she's continued to have fullness when she eats along with weight loss. She does not sound volume overloaded in other ways. Denies any bleeding. She sees pulmonary tomorrow. The patient does not have symptoms concerning for COVID-19 infection (fever, chills, cough, or new shortness of breath).   Past Medical History:  Diagnosis Date   Acute respiratory failure (HCC)    Anemia    previous blood transfusions   Arthritis    "all over"   Asthma    Atrial flutter (HCC)    Bradycardia    requiring previous d/c of BB and reduction of amiodarone   CAD (coronary artery disease)    nonobstructive per notes   Chronic diastolic CHF (congestive heart failure) (HCC)    CKD (chronic kidney disease), stage III (HCC)    Complication of blood transfusion    "got the wrong blood type at [facility] in ~ 2015; no adverse reaction that we are  aware of"/daughter, Shannon [name] (01/27/2016)   COPD (chronic obstructive pulmonary disease) (HCC)    Depression    "light case"   DVT (deep venous thrombosis) (HCC) 01/2016   a. LLE DVT 01/2016 -  switched from Eliquis to Coumadin.   Dyspnea    with some activity   Gastric stenosis    a. s/p stomach tube   GERD (gastroesophageal reflux disease)    History of blood transfusion    "several" (01/27/2016)   History of stomach ulcers    Hyperlipidemia    Hypertension    Hypothyroidism    On home oxygen therapy    "2L; 9PM til 9AM" (06/27/2017)   PAD (peripheral artery disease) (HCC)    a. prior gangrene, toe amputations, intervention.   PAF (paroxysmal atrial fibrillation) (HCC)    Paraesophageal hernia    Perforated gastric ulcer (HCC)    Pneumonia    "a few times" (06/27/2017)   Seasonal allergies    SIADH (syndrome of inappropriate ADH production) (HCC)    Shannon [name] 01/10/2015   Small bowel obstruction (HCC)    "I don't know how many" (01/11/2015)   Stroke (HCC)    several- no residual   Type II diabetes mellitus (HCC)    "related to prednisone use  for > 20 yr; once predinose stopped; no more DM RX" (01/27/2016)   UTI (urinary tract infection) 02/08/2016   Ventral hernia with bowel obstruction    Past Surgical History:  Procedure Laterality Date   ABDOMINAL AORTOGRAM N/A 09/21/2016   Procedure: Abdominal Aortogram;  Surgeon: Maeola Harman, MD;  Location: [facility] INVASIVE CV LAB;  Service: Cardiovascular;  Laterality: N/A;   AMPUTATION Left 09/25/2016   Procedure: AMPUTATION DIGIT- LEFT 2ND AND 3RD TOES;  Surgeon: Larina Earthly, MD;  Location: [facility] OR;  Service: Vascular;  Laterality: Left;   AMPUTATION Right 05/03/2018   Procedure: AMPUTATION RIGHT GREAT TOE;  Surgeon: Larina Earthly, MD;  Location: [facility] OR;  Service: Vascular;  Laterality: Right;   CATARACT EXTRACTION W/ INTRAOCULAR LENS  IMPLANT, BILATERAL     CHOLECYSTECTOMY OPEN     COLECTOMY     ESOPHAGOGASTRODUODENOSCOPY N/A 01/19/2014   Procedure: ESOPHAGOGASTRODUODENOSCOPY (EGD);  Surgeon: Hilarie Fredrickson, MD;  Location: [facility] ENDOSCOPY;  Service: Endoscopy;  Laterality: N/A;    ESOPHAGOGASTRODUODENOSCOPY N/A 01/20/2014   Procedure: ESOPHAGOGASTRODUODENOSCOPY (EGD);  Surgeon: Hilarie Fredrickson, MD;  Location: [facility]OSCOPY;  Service: Endoscopy;  Laterality: N/A;   ESOPHAGOGASTRODUODENOSCOPY N/A 03/19/2014   Procedure: ESOPHAGOGASTRODUODENOSCOPY (EGD);  Surgeon: Rachael Fee, MD;  Location: [facility];  Service: Endoscopy;  Laterality: N/A;   ESOPHAGOGASTRODUODENOSCOPY N/A 07/08/2015   Procedure: ESOPHAGOGASTRODUODENOSCOPY (EGD);  Surgeon: Sherrilyn Rist, MD;  Location: [facility] ENDOSCOPY;  Service: Endoscopy;  Laterality: N/A;   ESOPHAGOGASTRODUODENOSCOPY (EGD) WITH PROPOFOL N/A 09/15/2015   Procedure: ESOPHAGOGASTRODUODENOSCOPY (EGD) WITH PROPOFOL;  Surgeon: Ruffin Frederick, MD;  Location: [facility] ENDOSCOPY;  Service: Gastroenterology;  Laterality: N/A;   GASTROJEJUNOSTOMY     hx/notes 01/10/2015   GASTROJEJUNOSTOMY N/A 09/23/2015   Procedure: OPEN GASTROJEJUNOSTOMY TUBE PLACEMENT;  Surgeon: De Blanch Kinsinger, MD;  Location: [facility] ORS;  Service: General;  Laterality: N/A;   GLAUCOMA SURGERY Bilateral    HERNIA REPAIR  2015   IR CM INJ ANY COLONIC TUBE W/FLUORO  01/05/2017   IR CM INJ ANY COLONIC TUBE W/FLUORO  06/07/2017   IR CM INJ ANY COLONIC TUBE W/FLUORO  11/05/2017   IR GENERIC HISTORICAL  01/07/2016   IR GJ TUBE CHANGE 01/07/2016 Malachy Moan,  MD [facility]   IR GENERIC HISTORICAL  01/27/2016   IR MECH REMOV OBSTRUC MAT ANY COLON TUBE W/FLUORO 01/27/2016 Richarda Overlie, MD [facility]   IR GENERIC HISTORICAL  02/07/2016   IR PATIENT EVAL TECH 0-60 MINS Darrell K Allred, PA-C [facility]   IR GENERIC HISTORICAL  02/08/2016   IR GJ TUBE CHANGE 02/08/2016 Berdine Dance, MD [facility]   IR GENERIC HISTORICAL  01/06/2016   IR GJ TUBE CHANGE 01/06/2016 [facility] OUT REF   IR GENERIC HISTORICAL  05/02/2016   IR CM INJ ANY COLONIC TUBE W/FLUORO 05/02/2016 Oley Balm, MD [facility]   IR GENERIC HISTORICAL  05/15/2016   IR GJ TUBE CHANGE 05/15/2016 Simonne Come, MD [facility]   IR GENERIC HISTORICAL  06/28/2016   IR GJ TUBE CHANGE 06/28/2016 [facility]   IR GJ TUBE CHANGE  02/20/2017   IR GJ TUBE CHANGE  05/10/2017   IR GJ TUBE CHANGE  07/06/2017   IR GJ TUBE CHANGE  08/02/2017   IR GJ TUBE CHANGE  10/15/2017   IR GJ TUBE CHANGE  12/26/2017   IR GJ TUBE CHANGE  04/08/2018   IR GJ TUBE CHANGE  06/19/2018   IR PATIENT EVAL TECH 0-60 MINS  10/19/2016   IR PATIENT EVAL TECH 0-60 MINS  12/25/2016   IR PATIENT EVAL TECH 0-60 MINS  01/29/2017   IR PATIENT EVAL TECH 0-60 MINS  04/04/2017   IR PATIENT EVAL TECH 0-60 MINS  04/30/2017   IR PATIENT EVAL TECH 0-60 MINS  07/31/2017   IR REPLC DUODEN/JEJUNO TUBE PERCUT W/FLUORO  11/14/2016   LAPAROTOMY N/A 01/20/2015   Procedure: EXPLORATORY LAPAROTOMY;  Surgeon: Abigail Miyamoto, MD;  Location: [facility];  Service: General;  Laterality: N/A;   LOWER EXTREMITY ANGIOGRAPHY Left 09/21/2016   Procedure: Lower Extremity Angiography;  Surgeon: Maeola Harman, MD;  Location: [facility] INVASIVE CV LAB;  Service: Cardiovascular;  Laterality: Left;   LOWER EXTREMITY ANGIOGRAPHY Right 06/27/2017   Procedure: Lower Extremity Angiography;  Surgeon: Maeola Harman, MD;  Location: [facility] INVASIVE CV LAB;  Service: Cardiovascular;  Laterality: Right;   LYSIS OF ADHESION N/A 01/20/2015   Procedure: LYSIS OF ADHESIONS < 1 HOUR;  Surgeon: Abigail Miyamoto, MD;  Location: [facility];  Service: General;  Laterality: N/A;   PERIPHERAL VASCULAR BALLOON ANGIOPLASTY Left 09/21/2016   Procedure: Peripheral Vascular Balloon Angioplasty;  Surgeon: Maeola Harman, MD;  Location: [facility] INVASIVE CV LAB;  Service: Cardiovascular;  Laterality: Left;  drug coated balloon   PERIPHERAL VASCULAR BALLOON ANGIOPLASTY Right 06/27/2017   Procedure: PERIPHERAL VASCULAR BALLOON ANGIOPLASTY;  Surgeon: Maeola Harman, MD;  Location: [facility] INVASIVE CV LAB;  Service: Cardiovascular;  Laterality: Right;  SFA/TPTRUNK    TONSILLECTOMY     TUBAL LIGATION     VENTRAL HERNIA REPAIR  2015   incarcerated ventral hernia ([facility] 09/2013)/notes 01/10/2015     Current Meds  Medication Sig   acetaminophen (TYLENOL) 500 MG tablet Take 500 mg by mouth every 6 (six) hours as needed (pain).    Amino Acids-Protein Hydrolys (FEEDING SUPPLEMENT, PRO-STAT SUGAR FREE 64,) LIQD Take 30 mLs by mouth 2 (two) times daily.   atorvastatin (LIPITOR) 10 MG tablet TAKE 1 TABLET BY MOUTH EVERY DAY   b complex vitamins tablet Take 1 tablet by mouth daily.    calcium-vitamin D (OSCAL WITH D) 500-200 MG-UNIT per tablet Take 1 tablet by mouth daily with breakfast.    Carboxymethylcellulose Sodium (ARTIFICIAL TEARS OP) Place 1 drop  into both eyes 4 (four) times daily.   carvedilol (COREG) 6.25 MG tablet Take 1 tablet (6.25 mg total) by mouth 2 (two) times daily.   collagenase (SANTYL) ointment Apply 1 application topically daily as needed. For wound care   cycloSPORINE (RESTASIS) 0.05 % ophthalmic emulsion USE 1 DROP INTO BOTH EYES TWICE DAILY   feeding supplement (OSMOLITE 1 CAL) LIQD Take 711 mLs by mouth See admin instructions. For 12 hours 2100 - 0900   ferrous sulfate 325 (65 FE) MG tablet Take 1 tablet (325 mg total) by mouth daily.   fluticasone (FLONASE) 50 MCG/ACT nasal spray Spray 2 sprays into each nostril once daily   furosemide (LASIX) 40 MG tablet TAKE 1 TABLET BY MOUTH EVERY DAY   gabapentin (NEURONTIN) 300 MG capsule TAKE 1 CAPSULE BY MOUTH TWICE A DAY   gentamicin cream (GARAMYCIN) 0.1 % Apply 1 application topically 3 (three) times daily.   hydrALAZINE (APRESOLINE) 50 MG tablet TAKE 1 TABLET BY MOUTH THREE TIMES A DAY   insulin aspart (NOVOLOG) 100 UNIT/ML FlexPen Inject 0-6 Units into the skin 3 (three) times daily with meals. New sliding Scale If BS is 151 - 251 = 2 units,  If BS is 251 -  350 = 4 units,  If BS is 351 - 450 = 6 units Call provider if BS runs more than 400   insulin glargine (LANTUS) 100  UNIT/ML injection Inject 8 Units into the skin at bedtime.    Insulin Pen Needle (B-D UF III MINI PEN NEEDLES) 31G X 5 MM MISC Use twice daily with giving insulin injections. Dx E11.9   ipratropium-albuterol (DUONEB) 0.5-2.5 (3) MG/3ML SOLN Take 3 mLs by nebulization every 6 (six) hours as needed (sob). Use 3 times daily x 4 days then every 6 hours as needed.   isosorbide mononitrate (IMDUR) 30 MG 24 hr tablet TAKE 2 TABLETS (60 MG TOTAL) BY MOUTH DAILY.   latanoprost (XALATAN) 0.005 % ophthalmic solution Place 1 drop into both eyes at bedtime.   levalbuterol (XOPENEX HFA) 45 MCG/ACT inhaler Inhale 2 puffs into the lungs every 6 (six) hours as needed for wheezing or shortness of breath.   levothyroxine (SYNTHROID, LEVOTHROID) 125 MCG tablet Take 1 tablet (125 mcg total) by mouth daily.   loratadine (CLARITIN) 10 MG tablet Take 10 mg by mouth daily as needed for allergies.   mometasone-formoterol (DULERA) 200-5 MCG/ACT AERO TAKE TWO PUFFS BY MOUTH TWICE DAILY   Multiple Vitamin (MULTIVITAMIN WITH MINERALS) TABS tablet Take 1 tablet by mouth daily.   nystatin (MYCOSTATIN/NYSTOP) powder Apply 1 g topically as needed.   oxyCODONE (ROXICODONE) 5 MG immediate release tablet Take 1 tablet (5 mg total) by mouth every 4 (four) hours as needed for severe pain. May take 1/2 tab q4hrs as needed for mild pain.   OXYGEN Inhale 2 L into the lungs at bedtime.   pantoprazole (PROTONIX) 40 MG tablet Take 1 tablet (40 mg total) by mouth 2 (two) times daily.   sertraline (ZOLOFT) 100 MG tablet TAKE 1 TABLET BY MOUTH EVERY DAY FOR DEPRESSION   warfarin (COUMADIN) 2.5 MG tablet Take by mouth See admin instructions. Take 5 mg every evening except Friday take 2.5 mg in the evening.   Water For Irrigation, Sterile (FREE WATER) SOLN Place 150 mLs into feeding tube 3 (three) times daily.    White Petrolatum-Mineral Oil (SYSTANE NIGHTTIME) OINT Place 1 application into both eyes at bedtime.      Allergies:    Penicillins  Social History   Tobacco Use   Smoking status: Never Smoker   Smokeless tobacco: Former Neurosurgeon    Types: Snuff   Tobacco comment: "used snuff in my younger days"  Substance Use Topics   Alcohol use: Never    Alcohol/week: 0.0 standard drinks    Frequency: Never   Drug use: Never     Family Hx: The patient's family history includes Diabetes in her brother; Glaucoma in her son and son; Hypertension in her mother; Stroke in her mother. There is no history of Heart attack, Colon cancer, or Stomach cancer.  ROS:   Please see the history of present illness.    All other systems reviewed and are negative.   Prior CV studies:   Most recent pertinent cardiac studies are outlined above.   Labs/Other Tests and Data Reviewed:    EKG:  An ECG dated 06/06/17 was personally reviewed today and demonstrated:  NSR 79bpm, nonspecific STT changes   Recent Labs: 04/17/2018: TSH 0.15 05/14/2018: ALT 22; BUN 67; Creat 1.42; Hemoglobin 8.8; Platelets 366; Potassium 4.5; Sodium 141   Recent Lipid Panel Lab Results  Component Value Date/Time   CHOL 97 08/01/2017 10:39 AM   CHOL 126 04/07/2015 10:15 AM   TRIG 120 08/01/2017 10:39 AM   HDL 53 08/01/2017 10:39 AM   HDL 69 04/07/2015 10:15 AM   CHOLHDL 1.8 08/01/2017 10:39 AM   LDLCALC 23 08/01/2017 10:39 AM    Wt Readings from Last 3 Encounters:  08/15/18 141 lb (64 kg)  06/25/18 148 lb (67.1 kg)  05/28/18 149 lb 0.5 oz (67.6 kg)     Objective:    Vital Signs:  BP (!) 146/70    Pulse 69    Ht 5\' 6"  (1.676 m)    Wt 141 lb (64 kg)    BMI 22.76 kg/m    VITAL SIGNS:  reviewed  General - elderly F in no acute distress Pulm - No labored breathing, no coughing during visit, no audible wheezing, speaking in full sentences Neuro - A+Ox3, no slurred speech, answers questions appropriately Psych - Pleasant affect   ASSESSMENT & PLAN:    1. PAF/atrial flutter - HR has been well controlled. No longer on amio for concerns above.  Strategy has been rate control. Was in NSR by last EKG. She is on Coumadin for reasons above, instead of NOAC. Last Hgb stable. She's actually done much better from a cardiac perspective than I ever anticipated having met her a few years ago. Her comorbidites however have been quite serious in recent years so conservative approach remains appropriate. 2. Essential HTN - Ms. Jenny's BP typically runs high when her BP meds are due (180-190). During the day it has been running 107/64->146/70s recently. I advised she take her BP from now on only after taking meds, rather than before, so that she doesn't run into issues with overtreating since her BP can be labile. She seems to have found a happy medium that she can tolerate for now. 3. History of bradycardia - HR has been normal recently per daughter. 4. Nonobstructive CAD - no recent anginal symptoms. 5. Chronic diastolic CHF - she has had some early satiety but has a very complex abdominal history and no other symptoms to suggest volume overload. In the setting of poor oral intake I doubt related to cardiac status. Have asked her to discuss with PCP given their ongoing management of her abdominal issues. Continue current regimen otherwise.  COVID-19 Education: The signs and  symptoms of COVID-19 were discussed with the patient and how to seek care for testing (follow up with PCP or arrange E-visit). The importance of social distancing was discussed today.  Time:   Today, I have spent 17 minutes with the patient with telehealth technology discussing the above problems.     Medication Adjustments/Labs and Tests Ordered: Current medicines are reviewed at length with the patient today.  Concerns regarding medicines are outlined above.    Disposition:  Follow up 6 months with Dr. Delton See  Signed, Laurann Montana, PA-C  08/15/2018 10:02 AM    [facility]

## 2018-08-15 ENCOUNTER — Other Ambulatory Visit: Payer: Self-pay

## 2018-08-15 ENCOUNTER — Telehealth (INDEPENDENT_AMBULATORY_CARE_PROVIDER_SITE_OTHER): Payer: Medicare Other | Admitting: Physician Assistant

## 2018-08-15 ENCOUNTER — Encounter: Payer: Self-pay | Admitting: Physician Assistant

## 2018-08-15 VITALS — BP 146/70 | HR 69 | Ht 66.0 in | Wt 141.0 lb

## 2018-08-15 DIAGNOSIS — Z87898 Personal history of other specified conditions: Secondary | ICD-10-CM

## 2018-08-15 DIAGNOSIS — I4891 Unspecified atrial fibrillation: Secondary | ICD-10-CM | POA: Diagnosis not present

## 2018-08-15 DIAGNOSIS — I10 Essential (primary) hypertension: Secondary | ICD-10-CM

## 2018-08-15 DIAGNOSIS — I2510 Atherosclerotic heart disease of native coronary artery without angina pectoris: Secondary | ICD-10-CM

## 2018-08-15 DIAGNOSIS — I5032 Chronic diastolic (congestive) heart failure: Secondary | ICD-10-CM

## 2018-08-15 DIAGNOSIS — I4892 Unspecified atrial flutter: Secondary | ICD-10-CM

## 2018-08-15 NOTE — Patient Instructions (Signed)
Medication Instructions:  Your physician recommends that you continue on your current medications as directed. Please refer to the Current Medication list given to you today. 2 If you need a refill on your cardiac medications before your next appointment, please call your pharmacy.   Lab work: None ordered 2 If you have labs (blood work) drawn today and your tests are completely normal, you will receive your results only by: [name] MyChart Message (if you have MyChart) OR . A paper copy in the mail If you have any lab test that is abnormal or we need to change your treatment, we will call you to review the results.  Testing/Procedures: None ordered  Follow-Up: At [facility], you and your health needs are our priority.  As part of our continuing mission to provide you with exceptional heart care, we have created designated Provider Care Teams.  These Care Teams include your primary Cardiologist (physician) and Advanced Practice Providers (APPs -  Physician Assistants and Nurse Practitioners) who all work together to provide you with the care you need, when you need it. You will need a follow up appointment in 6 months.  Please call our office 2 months in advance to schedule this appointment.  You may see Ena Dawley, MD or one of the following Advanced Practice Providers on your designated Care Team:   [place], PA-C Melina Copa, PA-C . Ermalinda Barrios, PA-C  Any Other Special Instructions Will Be Listed Below (If Applicable). Please contact your primary care physician regarding your abdominal discomfort

## 2018-08-16 ENCOUNTER — Other Ambulatory Visit: Payer: Self-pay

## 2018-08-16 ENCOUNTER — Ambulatory Visit (INDEPENDENT_AMBULATORY_CARE_PROVIDER_SITE_OTHER): Payer: Medicare Other | Admitting: Nurse Practitioner

## 2018-08-16 ENCOUNTER — Encounter: Payer: Self-pay | Admitting: General Surgery

## 2018-08-16 ENCOUNTER — Encounter: Payer: Self-pay | Admitting: Nurse Practitioner

## 2018-08-16 DIAGNOSIS — J4530 Mild persistent asthma, uncomplicated: Secondary | ICD-10-CM | POA: Diagnosis not present

## 2018-08-16 NOTE — Assessment & Plan Note (Signed)
Patient continues to do well.  She states that she needs a new order for continuation of oxygen at night.  Is compliant with Dulera.  Patient Instructions  Continue Dulera Continue nebulizer as needed PRN Keep close watch on Oxygen levels Stay active Will contact advanced home care to continue O2 at night  Follow up: Follow up with Dr. Vaughan Browner in 1 year or sooner if needed

## 2018-08-16 NOTE — Patient Instructions (Addendum)
Continue Dulera Continue nebulizer as needed PRN Keep close watch on Oxygen levels Stay active Will contact advanced home care to continue O2 at night  Follow up: Follow up with Dr. Vaughan Browner in 1 year or sooner if needed

## 2018-08-16 NOTE — Progress Notes (Signed)
@Patient  ID: Kristeen Miss, female    DOB: December 22, 1923, 85 y.o.   MRN: [id number]  Chief Complaint  Patient presents with  . Issue with feeding tube    Patients she has had it 3 - 4 years.     Referring provider: Sharon Seller, NP  HPI 85 year old female with asthma who is followed by Dr. Isaiah Serge. PMH includes heart failure, chronic kidney disease, diabetes, dysphagia with recurrent aspiration status post PEG tube placement, and gastric outlet obstruction.  Patient has had a referral for home palliative care.  She is currently DNR/DNI.  Tests: CXR 03/10/16- left lower lobe infiltrate with associated effusion.  CXR 06/18/16- mild bronchitis. All images personally reviewed.  Echo 01/28/16 Normal LV systolic function; grade 1 diastolic dysfunction; mild AI; mild MR; moderate LAE.  CBC with differential 11/15/16-WBC 5.5, eosinophils 19%, absolute eosinophil count 1000  OV 08/16/18 -follow-up Patient presents today for follow-up visit.  She was last seen by Dr. Isaiah Serge on 08/08/2017.  Patient has been doing well.  This is been a stable interval for her.  She uses 2 L of oxygen at night.  She states that [facility] needed her to follow-up and have a new order placed to continue oxygen.  She is compliant with Dulera and has duo nebs and Xopenex available as needed for shortness of breath.  She ambulates with a rolling walker. Denies f/c/s, n/v/d, hemoptysis, PND, leg swelling.       Allergies  Allergen Reactions  . Penicillins Itching, Rash and Other (See Comments)    Tolerated amoxicillin, unasyn, zosyn & cephalosporins in the past. Did it involve swelling of the face/tongue/throat, SOB, or low BP? No Did it involve sudden or severe rash/hives, skin peeling, or any reaction on the inside of your mouth or nose? No Did you need to seek medical attention at a hospital or doctor's office? Unknown When did it last happen?5+ years If all above answers are "NO", may proceed  with cephalosporin use.     Immunization History  Administered Date(s) Administered  . Influenza, High Dose Seasonal PF 01/19/2017  . Influenza,inj,Quad PF,6+ Mos 12/24/2013, 01/27/2015, 12/29/2015, 12/04/2017  . Influenza-Unspecified 12/02/2012  . Pneumococcal Conjugate-13 02/03/2014  . Pneumococcal Polysaccharide-23 08/31/2016  . Tdap 07/18/2016  . Zoster 04/12/2011    Past Medical History:  Diagnosis Date  . Acute respiratory failure (HCC)   . Anemia    previous blood transfusions  . Arthritis    "all over"  . Asthma   . Atrial flutter (HCC)   . Bradycardia    requiring previous d/c of BB and reduction of amiodarone  . CAD (coronary artery disease)    nonobstructive per notes  . Chronic diastolic CHF (congestive heart failure) (HCC)   . CKD (chronic kidney disease), stage III (HCC)   . Complication of blood transfusion    "got the wrong blood type at [facility] in ~ 2015; no adverse reaction that we are aware of"/daughter, [name] (01/27/2016)  . COPD (chronic obstructive pulmonary disease) (HCC)   . Depression    "light case"  . DVT (deep venous thrombosis) (HCC) 01/2016   a. LLE DVT 01/2016 - switched from Eliquis to Coumadin.  [name] Dyspnea    with some activity  . Gastric stenosis    a. s/p stomach tube  . GERD (gastroesophageal reflux disease)   . History of blood transfusion    "several" (01/27/2016)  . History of stomach ulcers   . Hyperlipidemia   .  Hypertension   . Hypothyroidism   . On home oxygen therapy    "2L; 9PM til 9AM" (06/27/2017)  . PAD (peripheral artery disease) (HCC)    a. prior gangrene, toe amputations, intervention.  [name] PAF (paroxysmal atrial fibrillation) (HCC)   . Paraesophageal hernia   . Perforated gastric ulcer (HCC)   . Pneumonia    "a few times" (06/27/2017)  . Seasonal allergies   . SIADH (syndrome of inappropriate ADH production) (HCC)    [name] 01/10/2015  . Small bowel obstruction (HCC)    "I don't know how many" (01/11/2015)   . Stroke [facility])    several- no residual  . Type II diabetes mellitus (HCC)    "related to prednisone use  for > 20 yr; once predinose stopped; no more DM RX" (01/27/2016)  . UTI (urinary tract infection) 02/08/2016  . Ventral hernia with bowel obstruction     Tobacco History: Social History   Tobacco Use  Smoking Status Never Smoker  Smokeless Tobacco Former Neurosurgeon  . Types: Snuff  Tobacco Comment   "used snuff in my younger days"   Counseling given: Not Answered Comment: "used snuff in my younger days"   Outpatient Encounter Medications as of 08/16/2018  Medication Sig  . acetaminophen (TYLENOL) 500 MG tablet Take 500 mg by mouth every 6 (six) hours as needed (pain).   . Amino Acids-Protein Hydrolys (FEEDING SUPPLEMENT, PRO-STAT SUGAR FREE 64,) LIQD Take 30 mLs by mouth 2 (two) times daily.  [name] atorvastatin (LIPITOR) 10 MG tablet TAKE 1 TABLET BY MOUTH EVERY DAY  . b complex vitamins tablet Take 1 tablet by mouth daily.   . calcium-vitamin D (OSCAL WITH D) 500-200 MG-UNIT per tablet Take 1 tablet by mouth daily with breakfast.   . Carboxymethylcellulose Sodium (ARTIFICIAL TEARS OP) Place 1 drop into both eyes 4 (four) times daily.  . carvedilol (COREG) 6.25 MG tablet Take 1 tablet (6.25 mg total) by mouth 2 (two) times daily.  . collagenase (SANTYL) ointment Apply 1 application topically daily as needed. For wound care  . cycloSPORINE (RESTASIS) 0.05 % ophthalmic emulsion USE 1 DROP INTO BOTH EYES TWICE DAILY  . feeding supplement (OSMOLITE 1 CAL) LIQD Take 711 mLs by mouth See admin instructions. For 12 hours 2100 - 0900  . ferrous sulfate 325 (65 FE) MG tablet Take 1 tablet (325 mg total) by mouth daily.  . fluticasone (FLONASE) 50 MCG/ACT nasal spray Spray 2 sprays into each nostril once daily  . furosemide (LASIX) 40 MG tablet TAKE 1 TABLET BY MOUTH EVERY DAY  . gabapentin (NEURONTIN) 300 MG capsule TAKE 1 CAPSULE BY MOUTH TWICE A DAY  . gentamicin cream (GARAMYCIN) 0.1 % Apply  1 application topically 3 (three) times daily.  . hydrALAZINE (APRESOLINE) 50 MG tablet TAKE 1 TABLET BY MOUTH THREE TIMES A DAY  . insulin aspart (NOVOLOG) 100 UNIT/ML FlexPen Inject 0-6 Units into the skin 3 (three) times daily with meals. New sliding Scale If BS is 151 - 251 = 2 units,  If BS is 251 -  350 = 4 units,  If BS is 351 - 450 = 6 units Call provider if BS runs more than 400  . insulin glargine (LANTUS) 100 UNIT/ML injection Inject 8 Units into the skin at bedtime.   . Insulin Pen Needle (B-D UF III MINI PEN NEEDLES) 31G X 5 MM MISC Use twice daily with giving insulin injections. Dx E11.9  . ipratropium-albuterol (DUONEB) 0.5-2.5 (3) MG/3ML SOLN Take 3 mLs by  nebulization every 6 (six) hours as needed (sob). Use 3 times daily x 4 days then every 6 hours as needed.  . isosorbide mononitrate (IMDUR) 30 MG 24 hr tablet TAKE 2 TABLETS (60 MG TOTAL) BY MOUTH DAILY.  [name] latanoprost (XALATAN) 0.005 % ophthalmic solution Place 1 drop into both eyes at bedtime.  . levalbuterol (XOPENEX HFA) 45 MCG/ACT inhaler Inhale 2 puffs into the lungs every 6 (six) hours as needed for wheezing or shortness of breath.  . levothyroxine (SYNTHROID, LEVOTHROID) 125 MCG tablet Take 1 tablet (125 mcg total) by mouth daily.  [name] loratadine (CLARITIN) 10 MG tablet Take 10 mg by mouth daily as needed for allergies.  . mometasone-formoterol (DULERA) 200-5 MCG/ACT AERO TAKE TWO PUFFS BY MOUTH TWICE DAILY  . Multiple Vitamin (MULTIVITAMIN WITH MINERALS) TABS tablet Take 1 tablet by mouth daily.  [name] nystatin (MYCOSTATIN/NYSTOP) powder Apply 1 g topically as needed.  [name] oxyCODONE (ROXICODONE) 5 MG immediate release tablet Take 1 tablet (5 mg total) by mouth every 4 (four) hours as needed for severe pain. May take 1/2 tab q4hrs as needed for mild pain.  . OXYGEN Inhale 2 L into the lungs at bedtime.  . pantoprazole (PROTONIX) 40 MG tablet Take 1 tablet (40 mg total) by mouth 2 (two) times daily.  . sertraline (ZOLOFT) 100 MG  tablet TAKE 1 TABLET BY MOUTH EVERY DAY FOR DEPRESSION  . warfarin (COUMADIN) 2.5 MG tablet Take by mouth See admin instructions. Take 5 mg every evening except Friday take 2.5 mg in the evening.  . Water For Irrigation, Sterile (FREE WATER) SOLN Place 150 mLs into feeding tube 3 (three) times daily.   Cliffton Asters Petrolatum-Mineral Oil (SYSTANE NIGHTTIME) OINT Place 1 application into both eyes at bedtime.    No facility-administered encounter medications on file as of 08/16/2018.      Review of Systems  Review of Systems  Constitutional: Negative.  Negative for chills and fever.  HENT: Negative.   Respiratory: Negative for cough, shortness of breath and wheezing.   Cardiovascular: Negative.  Negative for chest pain, palpitations and leg swelling.  Gastrointestinal: Negative.   Allergic/Immunologic: Negative.   Neurological: Negative.   Psychiatric/Behavioral: Negative.        Physical Exam  BP 122/70 (BP Location: Left Arm, Patient Position: Sitting, Cuff Size: Normal)   Pulse 65   Temp 98.6 F (37 C)   Ht 5\' 6"  (1.676 m)   Wt 145 lb 12.8 oz (66.1 kg)   SpO2 100%   BMI 23.53 kg/m   Wt Readings from Last 5 Encounters:  08/16/18 145 lb 12.8 oz (66.1 kg)  08/15/18 141 lb (64 kg)  06/25/18 148 lb (67.1 kg)  05/28/18 149 lb 0.5 oz (67.6 kg)  05/14/18 148 lb 9.6 oz (67.4 kg)     Physical Exam Vitals signs and nursing note reviewed.  Constitutional:      General: She is not in acute distress.    Appearance: She is well-developed.  Cardiovascular:     Rate and Rhythm: Normal rate and regular rhythm.  Pulmonary:     Effort: Pulmonary effort is normal. No respiratory distress.     Breath sounds: No wheezing or rhonchi.  Musculoskeletal:        General: No swelling.  Neurological:     Mental Status: She is alert and oriented to person, place, and time.     Imaging: Dg Cm Inj Any Colonic Tube W/fluoro  Result Date: 08/01/2018 INDICATION: 85 year old female with an  indwelling percutaneous gastrojejunostomy tube. She is having pain at the tube entry site as well as abdominal pain and nausea. She presents for tube injection and evaluation. EXAM: Enteric tube injection and evaluation under fluoroscopy MEDICATIONS: None ANESTHESIA/SEDATION: None CONTRAST:  10 mL Omnipaque 300-administered into the gastric lumen. FLUOROSCOPY TIME:  Fluoroscopy Time: 0 minutes 24 seconds (2 mGy). COMPLICATIONS: None immediate. PROCEDURE: Informed written consent was obtained from the patient after a thorough discussion of the procedural risks, benefits and alternatives. All questions were addressed. A timeout was performed prior to the initiation of the procedure. The tube entry sites was evaluated. There is evidence of chafing along the superior and lateral aspect of the abdominal wall at the tube entry site. No evidence of purulence, fluctuance or induration. No evidence of fungal infection. Injection was performed first through the jejunal limb, and then through the gastric arm. Both the jejunal and gastric arms are widely patent. The tip of the jejunal tube is well positioned in the proximal jejunum. The gastric portion is well positioned in the stomach. No evidence of gastric dilation to suggest outlet obstruction. IMPRESSION: 1. Well-positioned and normally functioning gastro jejunostomy tube. 2. There is some evidence of external chafing of the abdominal wall skin surface at the tube entry site. Recommend frequent application of diaper rash cream to the affected site. Additionally, maintained tension on the bumper to minimize tube movement. Electronically Signed   By: Malachy Moan M.D.   On: 08/01/2018 13:35     Assessment & Plan:   Asthma with allergic rhinitis Patient continues to do well.  She states that she needs a new order for continuation of oxygen at night.  Is compliant with Dulera.  Patient Instructions  Continue Dulera Continue nebulizer as needed PRN Keep close  watch on Oxygen levels Stay active Will contact [facility] home [facility] to continue O2 at night  Follow up: Follow up with Dr. Isaiah Serge in 1 year or sooner if needed        Ivonne Andrew, NP 08/16/2018

## 2018-08-19 ENCOUNTER — Other Ambulatory Visit: Payer: Self-pay | Admitting: General Surgery

## 2018-08-19 DIAGNOSIS — J4530 Mild persistent asthma, uncomplicated: Secondary | ICD-10-CM

## 2018-08-21 ENCOUNTER — Ambulatory Visit: Payer: Medicare Other | Admitting: Nurse Practitioner

## 2018-08-27 ENCOUNTER — Encounter: Payer: Self-pay | Admitting: Nurse Practitioner

## 2018-08-27 ENCOUNTER — Other Ambulatory Visit: Payer: Self-pay

## 2018-08-27 ENCOUNTER — Ambulatory Visit (INDEPENDENT_AMBULATORY_CARE_PROVIDER_SITE_OTHER): Payer: Medicare Other | Admitting: Nurse Practitioner

## 2018-08-27 ENCOUNTER — Telehealth: Payer: Self-pay

## 2018-08-27 VITALS — BP 158/68 | HR 61 | Temp 98.3°F | Ht 66.0 in | Wt 145.0 lb

## 2018-08-27 DIAGNOSIS — I779 Disorder of arteries and arterioles, unspecified: Secondary | ICD-10-CM

## 2018-08-27 DIAGNOSIS — K5903 Drug induced constipation: Secondary | ICD-10-CM | POA: Diagnosis not present

## 2018-08-27 DIAGNOSIS — R109 Unspecified abdominal pain: Secondary | ICD-10-CM

## 2018-08-27 DIAGNOSIS — K9423 Gastrostomy malfunction: Secondary | ICD-10-CM

## 2018-08-27 LAB — CBC WITH DIFFERENTIAL/PLATELET
Absolute Monocytes: 658 cells/uL (ref 200–950)
Basophils Absolute: 31 cells/uL (ref 0–200)
Basophils Relative: 0.6 %
Eosinophils Absolute: 454 cells/uL (ref 15–500)
Eosinophils Relative: 8.9 %
HCT: 30 % — ABNORMAL LOW (ref 35.0–45.0)
Hemoglobin: 9.7 g/dL — ABNORMAL LOW (ref 11.7–15.5)
Lymphs Abs: 938 cells/uL (ref 850–3900)
MCH: 30.2 pg (ref 27.0–33.0)
MCHC: 32.3 g/dL (ref 32.0–36.0)
MCV: 93.5 fL (ref 80.0–100.0)
MPV: 12.8 fL — ABNORMAL HIGH (ref 7.5–12.5)
Monocytes Relative: 12.9 %
Neutro Abs: 3019 cells/uL (ref 1500–7800)
Neutrophils Relative %: 59.2 %
Platelets: 191 10*3/uL (ref 140–400)
RBC: 3.21 10*6/uL — ABNORMAL LOW (ref 3.80–5.10)
RDW: 14.9 % (ref 11.0–15.0)
Total Lymphocyte: 18.4 %
WBC: 5.1 10*3/uL (ref 3.8–10.8)

## 2018-08-27 LAB — COMPLETE METABOLIC PANEL WITH GFR
AG Ratio: 1 (calc) (ref 1.0–2.5)
ALT: 14 U/L (ref 6–29)
AST: 22 U/L (ref 10–35)
Albumin: 3.8 g/dL (ref 3.6–5.1)
Alkaline phosphatase (APISO): 79 U/L (ref 37–153)
BUN/Creatinine Ratio: 50 (calc) — ABNORMAL HIGH (ref 6–22)
BUN: 71 mg/dL — ABNORMAL HIGH (ref 7–25)
CO2: 31 mmol/L (ref 20–32)
Calcium: 9.8 mg/dL (ref 8.6–10.4)
Chloride: 100 mmol/L (ref 98–110)
Creat: 1.43 mg/dL — ABNORMAL HIGH (ref 0.60–0.88)
GFR, Est African American: 36 mL/min/{1.73_m2} — ABNORMAL LOW (ref >=60)
GFR, Est Non African American: 31 mL/min/{1.73_m2} — ABNORMAL LOW (ref >=60)
Globulin: 3.9 g/dL (calc) — ABNORMAL HIGH (ref 1.9–3.7)
Glucose, Bld: 106 mg/dL (ref 65–139)
Potassium: 4.8 mmol/L (ref 3.5–5.3)
Sodium: 137 mmol/L (ref 135–146)
Total Bilirubin: 0.3 mg/dL (ref 0.2–1.2)
Total Protein: 7.7 g/dL (ref 6.1–8.1)

## 2018-08-27 LAB — AMYLASE: Amylase: 60 U/L (ref 21–101)

## 2018-08-27 LAB — LIPASE: Lipase: 24 U/L (ref 7–60)

## 2018-08-27 MED ORDER — LUBIPROSTONE 8 MCG PO CAPS: 8.0000 ug | ORAL_CAPSULE | Freq: Two times a day (BID) | ORAL | 1 refills | Status: DC

## 2018-08-27 NOTE — Progress Notes (Signed)
Careteam: Patient Care Team: [name], NP as PCP - General (Geriatric Medicine) Dorothy Spark, MD as PCP - Cardiology (Cardiology) Edrick Kins, DPM as Consulting Physician (Podiatry) Dorothy Spark, MD as Consulting Physician (Cardiology) [name], NP as Nurse Practitioner ([place]) Camillo Flaming, [place] as Referring Physician ([place]) Tobi Bastos, RN as [place] Management  Advanced Directive information    Allergies  Allergen Reactions  . Penicillins Itching, Rash and Other (See Comments)    Tolerated amoxicillin, unasyn, zosyn & cephalosporins in the past. Did it involve swelling of the face/tongue/throat, SOB, or low BP? No Did it involve sudden or severe rash/hives, skin peeling, or any reaction on the inside of your mouth or nose? No Did you need to seek medical attention at a hospital or doctor's office? Unknown When did it last happen?5+ years If all above answers are "NO", may proceed with cephalosporin use.     Chief Complaint  Patient presents with  . Acute Visit    Stomach issues, possible referral to GI doctor. Patient also c/o left side neck pain and ongoing shoulder pain- had injection, didn't help . Here with daughter      HPI: Patient is a 85 y.o. female seen in the office today due to g tube issues.  She has had a lot of issus with her g tube. Has had pain at her site. Took her to radiology and that she was raw on the inside and to use nystatin. She continued to use nystatin and pain and redness resolved. Reports now she is having a full feeling and not eating much but maintaining her weight at 140-142 lbs.  Continues to have a fullness- not eating much and the g-tube will leak.  She has been following with radiologist who suggested Gi referral. She has had many exchanges to g-tube and was told that it was due to site being stretched out due to exchanges.   Just started leaking again  day before yesterday.  Feeling nauseous and when she flushes PEG she will have episode of vomiting. Last time this happened was in April but ongoing nausea.   Reports she is constipated and takes stool softener twice daily and still wont have BM. Last BM last night, sometimes BM are hard but sometimes are soft.  Used to take miralax in the past, did not work well and caused gas Using senokot twice daily  Reports she used reglan in hospital which caused her to shake.   Also Reports she feels sore all around her right side.  Has been going on for 1 month or more.   Has incisional hernia which is uncomfortable but not causing pain.   Review of Systems:  Review of Systems  Constitutional: Negative for chills, fever and weight loss.  Respiratory: Negative for shortness of breath.   Cardiovascular: Negative for chest pain.  Gastrointestinal: Positive for abdominal pain, constipation, heartburn and nausea. Negative for blood in stool, diarrhea, melena and vomiting.  Genitourinary: Negative for dysuria and urgency.  Skin: Negative for itching and rash.    Past Medical History:  Diagnosis Date  . Acute respiratory failure ([place])   . Anemia    previous blood transfusions  . Arthritis    "all over"  . Asthma   . Atrial flutter ([place])   . Bradycardia    requiring previous d/c of BB and reduction of amiodarone  . CAD (coronary artery disease)    nonobstructive per  notes  . Chronic diastolic CHF (congestive heart failure) ([place])   . CKD (chronic kidney disease), stage III ([place])   . Complication of blood transfusion    "got the wrong blood type at [facility] in ~ 2015; no adverse reaction that we are aware of"/daughter, [name] (01/27/2016)  . COPD (chronic obstructive pulmonary disease) ([place])   . Depression    "light case"  . DVT (deep venous thrombosis) ([place]) 01/2016   a. LLE DVT 01/2016 - switched from Eliquis to Coumadin.  [name] Dyspnea    with some activity  . Gastric stenosis    a. s/p  stomach tube  . GERD (gastroesophageal reflux disease)   . History of blood transfusion    "several" (01/27/2016)  . History of stomach ulcers   . Hyperlipidemia   . Hypertension   . Hypothyroidism   . On home oxygen therapy    "2L; 9PM til 9AM" (06/27/2017)  . PAD (peripheral artery disease) ([place])    a. prior gangrene, toe amputations, intervention.  [name] PAF (paroxysmal atrial fibrillation) ([place])   . Paraesophageal hernia   . Perforated gastric ulcer ([place])   . Pneumonia    "a few times" (06/27/2017)  . Seasonal allergies   . SIADH (syndrome of inappropriate ADH production) ([place])    [name] 01/10/2015  . Small bowel obstruction ([place])    "I don't know how many" (01/11/2015)  . Stroke [place])    several- no residual  . Type II diabetes mellitus ([place])    "related to prednisone use  for > 20 yr; once predinose stopped; no more DM RX" (01/27/2016)  . UTI (urinary tract infection) 02/08/2016  . Ventral hernia with bowel obstruction    Past Surgical History:  Procedure Laterality Date  . ABDOMINAL AORTOGRAM N/A 09/21/2016   Procedure: Abdominal Aortogram;  Surgeon: Waynetta Sandy, MD;  Location: [facility];  Service: Cardiovascular;  Laterality: N/A;  . AMPUTATION Left 09/25/2016   Procedure: AMPUTATION DIGIT- LEFT 2ND AND 3RD TOES;  Surgeon: Rosetta Posner, MD;  Location: [facility];  Service: Vascular;  Laterality: Left;  . AMPUTATION Right 05/03/2018   Procedure: AMPUTATION RIGHT GREAT TOE;  Surgeon: Rosetta Posner, MD;  Location: [facility];  Service: Vascular;  Laterality: Right;  . CATARACT EXTRACTION W/ INTRAOCULAR LENS  IMPLANT, BILATERAL    . CHOLECYSTECTOMY OPEN    . COLECTOMY    . ESOPHAGOGASTRODUODENOSCOPY N/A 01/19/2014   Procedure: ESOPHAGOGASTRODUODENOSCOPY (EGD);  Surgeon: Irene Shipper, MD;  [facility];  Service: Endoscopy;  Laterality: N/A;  . ESOPHAGOGASTRODUODENOSCOPY N/A 01/20/2014   Procedure: ESOPHAGOGASTRODUODENOSCOPY (EGD);  Surgeon: Irene Shipper,  MD;  Location: [facility];  Service: Endoscopy;  Laterality: N/A;  . ESOPHAGOGASTRODUODENOSCOPY N/A 03/19/2014   Procedure: ESOPHAGOGASTRODUODENOSCOPY (EGD);  Surgeon: Milus Banister, MD;  Location: [facility];  Service: Endoscopy;  Laterality: N/A;  . ESOPHAGOGASTRODUODENOSCOPY N/A 07/08/2015   Procedure: ESOPHAGOGASTRODUODENOSCOPY (EGD);  Surgeon: Doran Stabler, MD;  Location[facility];  Service: Endoscopy;  Laterality: N/A;  . ESOPHAGOGASTRODUODENOSCOPY (EGD) WITH PROPOFOL N/A 09/15/2015   Procedure: ESOPHAGOGASTRODUODENOSCOPY (EGD) WITH PROPOFOL;  Surgeon: Manus Gunning, MD;  [facility];  Service: Gastroenterology;  Laterality: N/A;  . GASTROJEJUNOSTOMY     hx/notes 01/10/2015  . GASTROJEJUNOSTOMY N/A 09/23/2015   Procedure: OPEN GASTROJEJUNOSTOMY TUBE PLACEMENT;  Surgeon: Arta Bruce Kinsinger, MD;  Location: [facility];  Service: General;  Laterality: N/A;  . GLAUCOMA SURGERY Bilateral   . HERNIA REPAIR  2015  .  IR CM INJ ANY COLONIC TUBE W/FLUORO  01/05/2017  . IR CM INJ ANY COLONIC TUBE W/FLUORO  06/07/2017  . IR CM INJ ANY COLONIC TUBE W/FLUORO  11/05/2017  . IR GENERIC HISTORICAL  01/07/2016   IR GJ TUBE CHANGE 01/07/2016 Jacqulynn Cadet, MD [facility]  . IR GENERIC HISTORICAL  01/27/2016   IR MECH REMOV OBSTRUC MAT ANY COLON TUBE W/FLUORO 01/27/2016 Markus Daft, MD [facility]  . IR GENERIC HISTORICAL  02/07/2016   IR PATIENT EVAL TECH 0-60 MINS Darrell K Allred, PA-C [facility]  . IR GENERIC HISTORICAL  02/08/2016   IR GJ TUBE CHANGE 02/08/2016 Greggory Keen, MD [facility]  . IR GENERIC HISTORICAL  01/06/2016   IR GJ TUBE CHANGE 01/06/2016 [facility] OUT REF  . IR GENERIC HISTORICAL  05/02/2016   IR CM INJ ANY COLONIC TUBE W/FLUORO 05/02/2016 Arne Cleveland, MD [facility]  . IR GENERIC HISTORICAL  05/15/2016   IR GJ TUBE CHANGE 05/15/2016 Sandi Mariscal, MD [facility]  . IR GENERIC HISTORICAL  06/28/2016   IR GJ TUBE CHANGE 06/28/2016 [facility]  . IR GJ  TUBE CHANGE  02/20/2017  . IR GJ TUBE CHANGE  05/10/2017  . IR GJ TUBE CHANGE  07/06/2017  . IR GJ TUBE CHANGE  08/02/2017  . IR GJ TUBE CHANGE  10/15/2017  . IR GJ TUBE CHANGE  12/26/2017  . IR [place] TUBE CHANGE  04/08/2018  . IR [place] TUBE CHANGE  06/19/2018  . IR PATIENT EVAL TECH 0-60 MINS  10/19/2016  . IR PATIENT EVAL TECH 0-60 MINS  12/25/2016  . IR PATIENT EVAL TECH 0-60 MINS  01/29/2017  . IR PATIENT EVAL TECH 0-60 MINS  04/04/2017  . IR PATIENT EVAL TECH 0-60 MINS  04/30/2017  . IR PATIENT EVAL TECH 0-60 MINS  07/31/2017  . IR REPLC DUODEN/JEJUNO TUBE PERCUT W/FLUORO  11/14/2016  . LAPAROTOMY N/A 01/20/2015   Procedure: EXPLORATORY LAPAROTOMY;  Surgeon: Coralie Keens, MD;  Location: [facility];  Service: General;  Laterality: N/A;  . LOWER EXTREMITY ANGIOGRAPHY Left 09/21/2016   Procedure: Lower Extremity Angiography;  Surgeon: Waynetta Sandy, MD;  Location: [facility];  Service: Cardiovascular;  Laterality: Left;  . LOWER EXTREMITY ANGIOGRAPHY Right 06/27/2017   Procedure: Lower Extremity Angiography;  Surgeon: Waynetta Sandy, MD;  Location: [facility];  Service: Cardiovascular;  Laterality: Right;  . LYSIS OF ADHESION N/A 01/20/2015   Procedure: LYSIS OF ADHESIONS < 1 HOUR;  Surgeon: Coralie Keens, MD;  Location: [facility];  Service: General;  Laterality: N/A;  . PERIPHERAL VASCULAR BALLOON ANGIOPLASTY Left 09/21/2016   Procedure: Peripheral Vascular Balloon Angioplasty;  Surgeon: Waynetta Sandy, MD;  Location: [facility];  Service: Cardiovascular;  Laterality: Left;  drug coated balloon  . PERIPHERAL VASCULAR BALLOON ANGIOPLASTY Right 06/27/2017   Procedure: PERIPHERAL VASCULAR BALLOON ANGIOPLASTY;  Surgeon: Waynetta Sandy, MD;  Location: [facility];  Service: Cardiovascular;  Laterality: Right;  SFA/TPTRUNK  . TONSILLECTOMY    . TUBAL LIGATION    . VENTRAL HERNIA REPAIR  2015   incarcerated ventral hernia ([facility] 09/2013)/notes  01/10/2015   Social History:   reports that she has never smoked. She has quit using smokeless tobacco.  Her smokeless tobacco use included snuff. She reports that she does not drink alcohol or use drugs.  Family History  Problem Relation Age of Onset  . Stroke Mother   . Hypertension Mother   . Diabetes Brother   .  Glaucoma Son   . Glaucoma Son   . Heart attack Neg Hx   . Colon cancer Neg Hx   . Stomach cancer Neg Hx     Medications: Patient's Medications  New Prescriptions   No medications on file  Previous Medications   ACETAMINOPHEN (TYLENOL) 500 MG TABLET    Take 500 mg by mouth every 6 (six) hours as needed (pain).    AMINO ACIDS-PROTEIN HYDROLYS (FEEDING SUPPLEMENT, PRO-STAT SUGAR FREE 64,) LIQD    Take 30 mLs by mouth 2 (two) times daily.   ATORVASTATIN (LIPITOR) 10 MG TABLET    TAKE 1 TABLET BY MOUTH EVERY DAY   B COMPLEX VITAMINS TABLET    Take 1 tablet by mouth daily.    CALCIUM-VITAMIN D (OSCAL WITH D) 500-200 MG-UNIT PER TABLET    Take 1 tablet by mouth daily with breakfast.    CARBOXYMETHYLCELLULOSE SODIUM (ARTIFICIAL TEARS OP)    Place 1 drop into both eyes 4 (four) times daily.   CARVEDILOL (COREG) 6.25 MG TABLET    Take 1 tablet (6.25 mg total) by mouth 2 (two) times daily.   COLLAGENASE (SANTYL) OINTMENT    Apply 1 application topically daily as needed. For wound care   CYCLOSPORINE (RESTASIS) 0.05 % OPHTHALMIC EMULSION    USE 1 DROP INTO BOTH EYES TWICE DAILY   FEEDING SUPPLEMENT (OSMOLITE 1 CAL) LIQD    Take 711 mLs by mouth See admin instructions. For 12 hours 2100 - 0900   FERROUS SULFATE 325 (65 FE) MG TABLET    Take 1 tablet (325 mg total) by mouth daily.   FLUTICASONE (FLONASE) 50 MCG/ACT NASAL SPRAY    Spray 2 sprays into each nostril once daily   FUROSEMIDE (LASIX) 40 MG TABLET    TAKE 1 TABLET BY MOUTH EVERY DAY   GABAPENTIN (NEURONTIN) 300 MG CAPSULE    TAKE 1 CAPSULE BY MOUTH TWICE A DAY   GENTAMICIN CREAM (GARAMYCIN) 0.1 %    Apply 1 application  topically 3 (three) times daily as needed.   HYDRALAZINE (APRESOLINE) 50 MG TABLET    TAKE 1 TABLET BY MOUTH THREE TIMES A DAY   INSULIN ASPART (NOVOLOG) 100 UNIT/ML FLEXPEN    Inject 0-6 Units into the skin 3 (three) times daily with meals. New sliding Scale If BS is 151 - 251 = 2 units,  If BS is 251 -  350 = 4 units,  If BS is 351 - 450 = 6 units Call provider if BS runs more than 400   INSULIN GLARGINE (LANTUS) 100 UNIT/ML INJECTION    Inject 8 Units into the skin at bedtime.    INSULIN PEN NEEDLE (B-D UF III MINI PEN NEEDLES) 31G X 5 MM MISC    Use twice daily with giving insulin injections. Dx E11.9   IPRATROPIUM-ALBUTEROL (DUONEB) 0.5-2.5 (3) MG/3ML SOLN    Take 3 mLs by nebulization every 6 (six) hours as needed (sob). Use 3 times daily x 4 days then every 6 hours as needed.   ISOSORBIDE MONONITRATE (IMDUR) 30 MG 24 HR TABLET    TAKE 2 TABLETS (60 MG TOTAL) BY MOUTH DAILY.   LATANOPROST (XALATAN) 0.005 % OPHTHALMIC SOLUTION    Place 1 drop into both eyes at bedtime.   LEVALBUTEROL (XOPENEX HFA) 45 MCG/ACT INHALER    Inhale 2 puffs into the lungs every 6 (six) hours as needed for wheezing or shortness of breath.   LEVOTHYROXINE (SYNTHROID, LEVOTHROID) 125 MCG TABLET    Take 1  tablet (125 mcg total) by mouth daily.   LORATADINE (CLARITIN) 10 MG TABLET    Take 10 mg by mouth daily as needed for allergies.   MOMETASONE-FORMOTEROL (DULERA) 200-5 MCG/ACT AERO    TAKE TWO PUFFS BY MOUTH TWICE DAILY   MULTIPLE VITAMIN (MULTIVITAMIN WITH MINERALS) TABS TABLET    Take 1 tablet by mouth daily.   NYSTATIN (MYCOSTATIN/NYSTOP) POWDER    Apply 1 g topically as needed.   OXYCODONE (ROXICODONE) 5 MG IMMEDIATE RELEASE TABLET    Take 1 tablet (5 mg total) by mouth every 4 (four) hours as needed for severe pain. May take 1/2 tab q4hrs as needed for mild pain.   OXYGEN    Inhale 2 L into the lungs at bedtime.   PANTOPRAZOLE (PROTONIX) 40 MG TABLET    Take 1 tablet (40 mg total) by mouth 2 (two) times daily.    SERTRALINE (ZOLOFT) 100 MG TABLET    TAKE 1 TABLET BY MOUTH EVERY DAY FOR DEPRESSION   WARFARIN (COUMADIN) 2.5 MG TABLET    Take by mouth See admin instructions. Take 5 mg every evening except Friday take 2.5 mg in the evening.   WATER FOR IRRIGATION, STERILE (FREE WATER) SOLN    Place 150 mLs into feeding tube 3 (three) times daily.    WHITE PETROLATUM-MINERAL OIL (SYSTANE NIGHTTIME) OINT    Place 1 application into both eyes at bedtime.   Modified Medications   No medications on file  Discontinued Medications   GENTAMICIN CREAM (GARAMYCIN) 0.1 %    Apply 1 application topically 3 (three) times daily.     Physical Exam:  Vitals:   08/27/18 1013  BP: (!) 158/68  Pulse: 61  Temp: 98.3 F (36.8 C)  TempSrc: Oral  SpO2: 97%  Weight: 145 lb (65.8 kg)  Height: _0  (1.676 m)   Body mass index is 23.4 kg/m.  Physical Exam Constitutional:      Appearance: Normal appearance. She is well-developed.  HENT:     Head: Normocephalic and atraumatic.  Eyes:     Pupils: Pupils are equal, round, and reactive to light.  Neck:     Musculoskeletal: Normal range of motion and neck supple.  Cardiovascular:     Rate and Rhythm: Normal rate and regular rhythm.     Heart sounds: Murmur present.  Pulmonary:     Effort: Pulmonary effort is normal.     Breath sounds: Normal breath sounds.  Abdominal:     General: Bowel sounds are normal.     Palpations: Abdomen is soft.     Tenderness: There is abdominal tenderness in the right upper quadrant and epigastric area.     Hernia: A hernia (incisional hernia, easily reducible. ) is present.     Comments: PEG tube  Musculoskeletal:     Right lower leg: No edema.     Left lower leg: No edema.  Skin:    General: Skin is warm and dry.     Findings: No erythema.     Comments: Right toes covered in bandage and with orthotic in place  Neurological:     Mental Status: She is alert and oriented to person, place, and time.     Labs reviewed: Basic  Metabolic Panel: Recent Labs    04/17/18 1526  05/07/18 0259 05/09/18 0311 05/14/18 1235  NA 141   < > 135 136 141  K 4.4   < > 4.1 3.9 4.5  CL 101   < > 103  102 105  CO2 34*   < > 21* 22 28  GLUCOSE 86   < > 211* 208* 35*  BUN 68*   < > 37* 40* 67*  CREATININE 1.49*   < > 1.30* 1.28* 1.42*  CALCIUM 9.9   < > 8.9 9.0 9.5  TSH 0.15*  --   --   --   --    < > = values in this interval not displayed.   Liver Function Tests: Recent Labs    04/17/18 1526 05/03/18 1055 05/14/18 1235  AST 22 42* 24  ALT 18 34 22  ALKPHOS  --  59  --   BILITOT 0.4 1.0 0.3  PROT 7.3 7.6 7.2  ALBUMIN  --  3.1*  --    No results for input(s): LIPASE, AMYLASE in the last 8760 hours. No results for input(s): AMMONIA in the last 8760 hours. CBC: Recent Labs    10/05/17 1047 04/17/18 1526  05/07/18 0259 05/09/18 0311 05/14/18 1235  WBC 6.7 5.7   < > 7.5 8.0 6.2  NEUTROABS 4,402 3,431  --   --   --  3,590  HGB 9.4* 10.3*   < > 7.9* 8.5* 8.8*  HCT 29.7* 31.5*   < > 24.1* 25.1* 27.0*  MCV 88.9 93.8   < > 91.3 90.3 94.7  PLT 265 207   < > 235 288 366   < > = values in this interval not displayed.   Lipid Panel: No results for input(s): CHOL, HDL, LDLCALC, TRIG, CHOLHDL, LDLDIRECT in the last 8760 hours. TSH: Recent Labs    04/17/18 1526  TSH 0.15*   A1C: Lab Results  Component Value Date   HGBA1C 6.6 (H) 04/17/2018     Assessment/Plan 1. Leaking PEG tube ([place]) Ongoing with abdominal discomfort.  - Ambulatory referral to Gastroenterology  2. Abdominal pain, unspecified abdominal location - abdominal discomfort to epigastric area and right upper quadrant. No fevers noted but having nausea. Without vomiting at this time. Will get lab, [place] and referral to GI at this time.  - Ambulatory referral to Gastroenterology - Amylase - Lipase - CBC with Differential/Platelet - CMP with eGFR([facility]) - US Abdomen Complete; Future  3. Drug-induced constipation -ongoing constipation which  could be causing fullness and some nausea, will get [place] and labs at this time. Discussed starting amitiza twice daily to help with constipation.  - lubiprostone (AMITIZA) 8 MCG capsule; Take 1 capsule (8 mcg total) by mouth 2 (two) times daily with a meal.  Dispense: 60 capsule; Refill: 1  Next appt: 10/07/2018  [name]. [place], [facility] [phone]

## 2018-08-27 NOTE — Telephone Encounter (Signed)
Unable to lmom busy tone

## 2018-08-27 NOTE — Patient Instructions (Addendum)
Feeling of fullness/nausea may be related to constipation  Will get blood work and ultrasound to re-evaluate  Samples of amitiza 8 mcg given to take twice daily with meals once lab work comes back - await these results before starting

## 2018-08-28 ENCOUNTER — Ambulatory Visit (INDEPENDENT_AMBULATORY_CARE_PROVIDER_SITE_OTHER): Payer: Medicare Other | Admitting: *Deleted

## 2018-08-28 ENCOUNTER — Ambulatory Visit (INDEPENDENT_AMBULATORY_CARE_PROVIDER_SITE_OTHER): Payer: Medicare Other | Admitting: Podiatry

## 2018-08-28 ENCOUNTER — Encounter: Payer: Self-pay | Admitting: Podiatry

## 2018-08-28 VITALS — Temp 97.9°F

## 2018-08-28 DIAGNOSIS — I4892 Unspecified atrial flutter: Secondary | ICD-10-CM

## 2018-08-28 DIAGNOSIS — I70235 Atherosclerosis of native arteries of right leg with ulceration of other part of foot: Secondary | ICD-10-CM

## 2018-08-28 DIAGNOSIS — L97512 Non-pressure chronic ulcer of other part of right foot with fat layer exposed: Secondary | ICD-10-CM | POA: Diagnosis not present

## 2018-08-28 DIAGNOSIS — E0859 Diabetes mellitus due to underlying condition with other circulatory complications: Secondary | ICD-10-CM

## 2018-08-28 DIAGNOSIS — Z5181 Encounter for therapeutic drug level monitoring: Secondary | ICD-10-CM | POA: Diagnosis not present

## 2018-08-28 DIAGNOSIS — I4891 Unspecified atrial fibrillation: Secondary | ICD-10-CM | POA: Diagnosis not present

## 2018-08-28 LAB — POCT INR: INR: 1.7 — AB (ref 2.0–3.0)

## 2018-08-28 NOTE — Patient Instructions (Signed)
Description   Spoke with dtr [name] (on DPR) and instructed pt to take 3 tabs today and then continue on same dosage 2 tablets daily except 1 tablet on Fridays.  Recheck in 2 weeks.  Call [facility] with any new medications [phone].

## 2018-08-29 ENCOUNTER — Telehealth: Payer: Self-pay | Admitting: Podiatry

## 2018-08-29 ENCOUNTER — Telehealth: Payer: Self-pay | Admitting: *Deleted

## 2018-08-29 NOTE — Telephone Encounter (Signed)
Received Prior Authorization Request from [facility] for Amitiza.  Initiated through [facility]. KeyCarlos American PA Case ID: [id number]  Request has been APPROVED

## 2018-08-29 NOTE — Telephone Encounter (Signed)
Pt was seen in office yesterday and was told that she would need to be seen at the [facility] office again. Office is calling to follow up and make sure this is correct before they have her come out again since she is in her 74's. Please give them a call.

## 2018-08-30 ENCOUNTER — Telehealth: Payer: Self-pay | Admitting: *Deleted

## 2018-08-30 ENCOUNTER — Telehealth: Payer: Self-pay | Admitting: Internal Medicine

## 2018-08-30 ENCOUNTER — Other Ambulatory Visit: Payer: Self-pay

## 2018-08-30 ENCOUNTER — Encounter: Payer: Self-pay | Admitting: Vascular Surgery

## 2018-08-30 ENCOUNTER — Ambulatory Visit (INDEPENDENT_AMBULATORY_CARE_PROVIDER_SITE_OTHER): Payer: Medicare Other | Admitting: Vascular Surgery

## 2018-08-30 ENCOUNTER — Telehealth: Payer: Self-pay

## 2018-08-30 VITALS — BP 123/64 | HR 68

## 2018-08-30 DIAGNOSIS — Z9862 Peripheral vascular angioplasty status: Secondary | ICD-10-CM | POA: Diagnosis not present

## 2018-08-30 DIAGNOSIS — I739 Peripheral vascular disease, unspecified: Secondary | ICD-10-CM | POA: Diagnosis not present

## 2018-08-30 NOTE — Telephone Encounter (Signed)
Dr. Henrene Pastor, pt's daughter requested an in-office visit.  Pt reported early satiety and being unable to eat.  Pt is on a feeding tube.  Please advise scheduling.

## 2018-08-30 NOTE — Telephone Encounter (Signed)
She has a feeding tube for anorexia.  She needs to follow-up with her PCP.  Thanks

## 2018-08-30 NOTE — Telephone Encounter (Signed)
Virtual Visit Pre-Appointment Phone Call  Today, I spoke with Shannon [name] daughter Shannon [name] and performed the following actions:  1. I explained that we are currently trying to limit exposure to the COVID-19 virus by seeing patients at home rather than in the office.  I explained that the visits are best done by video, but can be done by telephone.  I asked the patient if a virtual visit that the patient would like to try instead of coming into the office. Shannon [name] agreed to proceed with the virtual visit scheduled with Dr. Servando Snare on 08/30/2018.          If the patient said yes, I continued with 2 (below).  If the patient said no, I discussed other options and documented those and did not continue to 2.  2. I confirmed the BEST phone number to call the day of the visit and- I included this in appointment notes.  3. I asked if the patient had access to (through a family member/friend) a smartphone with video capability to be used for her visit?"  The patient said yes -         If the patient said yes, I documented "VIDEO" in the appointment notes.  If the patient said no, I documented "PHONE" in the appointment notes.  4. I confirmed consent by  a. sending through [place] or by email the [place] as written at the end of this message or  b. verbally as listed below. i. This visit is being performed in the setting of COVID-19. ii. All virtual visits are billed to your insurance company just like a normal visit would be.   iii. We'd like you to understand that the technology does not allow for your provider to perform an examination, and thus may limit your provider's ability to fully assess your condition.  iv. If your provider identifies any concerns that need to be evaluated in person, we will make arrangements to do so.   v. Finally, though the technology is pretty good, we cannot assure that it will always work on either your or  our end, and in the setting of a video visit, we may have to convert it to a phone-only visit.  In either situation, we cannot ensure that we have a secure connection.   vi. Are you willing to proceed?"  STAFF: Did the patient verbally acknowledge consent to telehealth visit? Document YES/NO here: YES  2. I advised the patient to be prepared - I asked that the patient, on the day of her visit, record any information possible with the equipment at her home, such as blood pressure, pulse, oxygen saturation, and your weight and write them all down. I asked the patient to have a pen and paper handy nearby the day of the visit as well.  3. If the patient was scheduled for a video visit, I informed the patient that the visit with the doctor would start with a text to the smartphone # given to [place] by the patient.      4. I Informed patient they will receive a phone call 15 minutes prior to their appointment time from a [place] or nurse to review medications, allergies, etc. to prepare for the visit.    TELEPHONE CALL NOTE  Shannon [name] has been deemed a candidate for a follow-up tele-health visit to limit community exposure during the Covid-19 pandemic. I spoke with the patient  via phone to ensure availability of phone/video source, confirm preferred email & phone number, and discuss instructions and expectations.  I reminded Shannon [name] to be prepared with any vital sign and/or heart rhythm information that could potentially be obtained via home monitoring, at the time of her visit. I reminded Shannon [name] to expect a phone call prior to her visit.  [name], NT 08/30/2018 2:12 PM     FULL LENGTH CONSENT FOR TELE-HEALTH VISIT   I hereby voluntarily request, consent and authorize [facility] and its employed or contracted physicians, physician assistants, nurse practitioners or other licensed health care professionals (the Practitioner), to provide me with telemedicine health care  services (the "Services") as deemed necessary by the treating Practitioner. I acknowledge and consent to receive the Services by the Practitioner via telemedicine. I understand that the telemedicine visit will involve communicating with the Practitioner through live audiovisual communication technology and the disclosure of certain medical information by electronic transmission. I acknowledge that I have been given the opportunity to request an in-person assessment or other available alternative prior to the telemedicine visit and am voluntarily participating in the telemedicine visit.  I understand that I have the right to withhold or withdraw my consent to the use of telemedicine in the course of my care at any time, without affecting my right to future care or treatment, and that the Practitioner or I may terminate the telemedicine visit at any time. I understand that I have the right to inspect all information obtained and/or recorded in the course of the telemedicine visit and may receive copies of available information for a reasonable fee.  I understand that some of the potential risks of receiving the Services via telemedicine include:  [name] Delay or interruption in medical evaluation due to technological equipment failure or disruption; . Information transmitted may not be sufficient (e.g. poor resolution of images) to allow for appropriate medical decision making by the Practitioner; and/or  . In rare instances, security protocols could fail, causing a breach of personal health information.  Furthermore, I acknowledge that it is my responsibility to provide information about my medical history, conditions and care that is complete and accurate to the best of my ability. I acknowledge that Practitioner's advice, recommendations, and/or decision may be based on factors not within their control, such as incomplete or inaccurate data provided by me or distortions of diagnostic images or specimens that may  result from electronic transmissions. I understand that the practice of medicine is not an exact science and that Practitioner makes no warranties or guarantees regarding treatment outcomes. I acknowledge that I will receive a copy of this consent concurrently upon execution via email to the email address I last provided but may also request a printed copy by calling the office of [place].    I understand that my insurance will be billed for this visit.   I have read or had this consent read to me. . I understand the contents of this consent, which adequately explains the benefits and risks of the Services being provided via telemedicine.  . I have been provided ample opportunity to ask questions regarding this consent and the Services and have had my questions answered to my satisfaction. . I give my informed consent for the services to be provided through the use of telemedicine in my medical care  By participating in this telemedicine visit I agree to the above.

## 2018-08-30 NOTE — Telephone Encounter (Signed)
Spoke with pts daughter. She reports pts g-tube was leaking and they went to pts PCP 08/27/18. She states the pt has had abdominal discomfort and they feel that it is due to the feedings emptying slow out of her stomach. Discussed with her that for the leaking she should see the surgeon that placed the tube. Daughter wants to know if she should proceed with surgeon or see Dr. Henrene Pastor for the discomfort, PCP referred her back to GI. Please advise.

## 2018-08-30 NOTE — Telephone Encounter (Signed)
Left message informing [facility] - Ivin Booty if pt's dtr had stated the toes looked bad then we would want her seen as soon as possible I would have Dr.Evans complete the notes.

## 2018-08-30 NOTE — Telephone Encounter (Signed)
I cannot see that she had any evaluation for this issue with her PCP.  Given her age and multiple complex issues, I recommend that she have a comprehensive evaluation with her PCP and then if there are specific issues, we might get involved.  There is nothing that we can do to improve gastric emptying performance, if this is the problem.

## 2018-08-30 NOTE — Telephone Encounter (Signed)
Daughter called and said that she would like to speak with someone. She said that she called earlier in the week and was given an appt with Dr Donnetta Hutching for Tuesday but then was called back and told that we needed to hold on the appt until we received more information from Dr Amalia Hailey office on what was seen at his visit on the 27th.   Spoke with Zigmund Daniel and she said that she had put a call in to Dr Amalia Hailey office yesterday to try to get information on what was seen by him given they are suggesting she be seen but there is no office note connected to the visit on the 27th. Called again to Dr Amalia Hailey office and advised that I needed more information on what was seen with Ms Smiths toes on the 27th. Advised patients daughter that we were working on this and I would call her back  Nurse called  Mateo Flow) and said that she had no clinicals that she could give me and that she had tried to reach out to Dr Amalia Hailey and she would send him another note. She said that if the daughter said that she needs an appt with [place] she recommends that we put her on the schedule to be seen.   Without knowing the severity of what is going on and unable to get any information from Dr Amalia Hailey office. We will treat this as an initial call and concern. Daughter is stating that the toes look more infected and she is concerned. Spoke with Dr Donzetta Matters who is in the office today and notified him of the above and pt is set to be seen for a virtual visit today.   York Cerise, CMA

## 2018-08-30 NOTE — Telephone Encounter (Signed)
Shannon [name] - [facility] states pt's dtr is calling to schedule pt because her toes look bad, and they do not have Dr. Amalia Hailey dictation from 08/28/2018.

## 2018-08-30 NOTE — Progress Notes (Signed)
Virtual Visit via Video Note   I connected with Shannon [name] on 08/30/2018 using the [employer] virtual platform and verified that I was speaking with the correct person using two identifiers. Patient was located at home with her daughter.  I located the [facility] office.   The limitations of evaluation and management by telemedicine and the availability of in person appointments have been previously discussed with the patient and are documented in the patients chart. The patient expressed understanding and consented to proceed.   PCP: [name], NP  Chief Complaint: Wounds on right second third and fourth toes  History of Present Illness: Shannon [name] is a 85 y.o. female with underwent right lower extremity endovascular revascularization with me in March of last year.  Subsequently had toe amputation of the right great toe January of this year.  Last visit that had healed she was set for PRN follow-up only.  Family now calls with wound of the right foot involving the right second third and fourth toes.  States this is been going on since mid April and has been seeing Dr. Amalia Hailey with [facility].  She has not had any fevers.  She was prescribed antibiotics but cannot tolerate them.  Currently not on antibiotics.  Past Medical History:  Diagnosis Date   Acute respiratory failure ([place])    Anemia    previous blood transfusions   Arthritis    "all over"   Asthma    Atrial flutter ([facility])    Bradycardia    requiring previous d/c of BB and reduction of amiodarone   CAD (coronary artery disease)    nonobstructive per notes   Chronic diastolic CHF (congestive heart failure) ([place])    CKD (chronic kidney disease), stage III ([place])    Complication of blood transfusion    "got the wrong blood type at [facility] in ~ 2015; no adverse reaction that we are aware of"/daughter, [name] (01/27/2016)   COPD (chronic obstructive pulmonary disease) ([place])    Depression    "light case"    DVT (deep venous thrombosis) ([place]) 01/2016   a. LLE DVT 01/2016 - switched from Eliquis to Coumadin.   Dyspnea    with some activity   Gastric stenosis    a. s/p stomach tube   GERD (gastroesophageal reflux disease)    History of blood transfusion    "several" (01/27/2016)   History of stomach ulcers    Hyperlipidemia    Hypertension    Hypothyroidism    On home oxygen therapy    "2L; 9PM til 9AM" (06/27/2017)   PAD (peripheral artery disease) ([facility])    a. prior gangrene, toe amputations, intervention.   PAF (paroxysmal atrial fibrillation) ([place])    Paraesophageal hernia    Perforated gastric ulcer ([place])    Pneumonia    "a few times" (06/27/2017)   Seasonal allergies    SIADH (syndrome of inappropriate ADH production) ([place])    [name] 01/10/2015   Small bowel obstruction ([place])    "I don't know how many" (01/11/2015)   Stroke ([place])    several- no residual   Type II diabetes mellitus ([place])    "related to prednisone use  for > 20 yr; once predinose stopped; no more DM RX" (01/27/2016)   UTI (urinary tract infection) 02/08/2016   Ventral hernia with bowel obstruction     Past Surgical History:  Procedure Laterality Date   ABDOMINAL AORTOGRAM N/A 09/21/2016   Procedure: Abdominal Aortogram;  Surgeon: Waynetta Sandy, MD;  Location: [facility];  Service: Cardiovascular;  Laterality: N/A;   AMPUTATION Left 09/25/2016   Procedure: AMPUTATION DIGIT- LEFT 2ND AND 3RD TOES;  Surgeon: Rosetta Posner, MD;  Location: [facility];  Service: Vascular;  Laterality: Left;   AMPUTATION Right 05/03/2018   Procedure: AMPUTATION RIGHT GREAT TOE;  Surgeon: Rosetta Posner, MD;  Location: [facility];  Service: Vascular;  Laterality: Right;   CATARACT EXTRACTION W/ INTRAOCULAR LENS  IMPLANT, BILATERAL     CHOLECYSTECTOMY OPEN     COLECTOMY     ESOPHAGOGASTRODUODENOSCOPY N/A 01/19/2014   Procedure: ESOPHAGOGASTRODUODENOSCOPY (EGD);  Surgeon: Irene Shipper, MD;   Location: [facility];  Service: Endoscopy;  Laterality: N/A;   ESOPHAGOGASTRODUODENOSCOPY N/A 01/20/2014   Procedure: ESOPHAGOGASTRODUODENOSCOPY (EGD);  Surgeon: Irene Shipper, MD;  Location: [facility];  Service: Endoscopy;  Laterality: N/A;   ESOPHAGOGASTRODUODENOSCOPY N/A 03/19/2014   Procedure: ESOPHAGOGASTRODUODENOSCOPY (EGD);  Surgeon: Milus Banister, MD;  Location: [facility];  Service: Endoscopy;  Laterality: N/A;   ESOPHAGOGASTRODUODENOSCOPY N/A 07/08/2015   Procedure: ESOPHAGOGASTRODUODENOSCOPY (EGD);  Surgeon: Doran Stabler, MD;  Location: [facility];  Service: Endoscopy;  Laterality: N/A;   ESOPHAGOGASTRODUODENOSCOPY (EGD) WITH PROPOFOL N/A 09/15/2015   Procedure: ESOPHAGOGASTRODUODENOSCOPY (EGD) WITH PROPOFOL;  Surgeon: Manus Gunning, MD;  Location: [facility];  Service: Gastroenterology;  Laterality: N/A;   GASTROJEJUNOSTOMY     hx/notes 01/10/2015   GASTROJEJUNOSTOMY N/A 09/23/2015   Procedure: OPEN GASTROJEJUNOSTOMY TUBE PLACEMENT;  Surgeon: Arta Bruce Kinsinger, MD;  Location: [facility];  Service: General;  Laterality: N/A;   GLAUCOMA SURGERY Bilateral    HERNIA REPAIR  2015   IR CM INJ ANY COLONIC TUBE W/FLUORO  01/05/2017   IR CM INJ ANY COLONIC TUBE W/FLUORO  06/07/2017   IR CM INJ ANY COLONIC TUBE W/FLUORO  11/05/2017   IR GENERIC HISTORICAL  01/07/2016   IR GJ TUBE CHANGE 01/07/2016 Jacqulynn Cadet, MD [facility]   IR GENERIC HISTORICAL  01/27/2016   IR MECH REMOV OBSTRUC MAT ANY COLON TUBE W/FLUORO 01/27/2016 Markus Daft, MD [facility]   IR GENERIC HISTORICAL  02/07/2016   IR PATIENT EVAL TECH 0-60 MINS Darrell K Allred, PA-C [facility]   IR GENERIC HISTORICAL  02/08/2016   IR GJ TUBE CHANGE 02/08/2016 Greggory Keen, MD [facility]   IR GENERIC HISTORICAL  01/06/2016   IR GJ TUBE CHANGE 01/06/2016 [facility] OUT REF   IR GENERIC HISTORICAL  05/02/2016   IR CM INJ ANY COLONIC TUBE W/FLUORO 05/02/2016 Arne Cleveland, MD [facility]   IR  GENERIC HISTORICAL  05/15/2016   IR GJ TUBE CHANGE 05/15/2016 Sandi Mariscal, MD [facility]   IR GENERIC HISTORICAL  06/28/2016   IR GJ TUBE CHANGE 06/28/2016 [facility]   IR GJ TUBE CHANGE  02/20/2017   IR GJ TUBE CHANGE  05/10/2017   IR GJ TUBE CHANGE  07/06/2017   IR GJ TUBE CHANGE  08/02/2017   IR GJ TUBE CHANGE  10/15/2017   IR GJ TUBE CHANGE  12/26/2017   IR GJ TUBE CHANGE  04/08/2018   IR GJ TUBE CHANGE  06/19/2018   IR PATIENT EVAL TECH 0-60 MINS  10/19/2016   IR PATIENT EVAL TECH 0-60 MINS  12/25/2016   IR PATIENT EVAL TECH 0-60 MINS  01/29/2017   IR PATIENT EVAL TECH 0-60 MINS  04/04/2017   IR PATIENT EVAL TECH 0-60 MINS  04/30/2017   IR PATIENT EVAL TECH 0-60 MINS  07/31/2017  IR REPLC DUODEN/JEJUNO TUBE PERCUT W/FLUORO  11/14/2016   LAPAROTOMY N/A 01/20/2015   Procedure: EXPLORATORY LAPAROTOMY;  Surgeon: Coralie Keens, MD;  Location: [facility];  Service: General;  Laterality: N/A;   LOWER EXTREMITY ANGIOGRAPHY Left 09/21/2016   Procedure: Lower Extremity Angiography;  Surgeon: Waynetta Sandy, MD;  Location: [facility];  Service: Cardiovascular;  Laterality: Left;   LOWER EXTREMITY ANGIOGRAPHY Right 06/27/2017   Procedure: Lower Extremity Angiography;  Surgeon: Waynetta Sandy, MD;  Location: [facility];  Service: Cardiovascular;  Laterality: Right;   LYSIS OF ADHESION N/A 01/20/2015   Procedure: LYSIS OF ADHESIONS < 1 HOUR;  Surgeon: Coralie Keens, MD;  Location: [facility];  Service: General;  Laterality: N/A;   PERIPHERAL VASCULAR BALLOON ANGIOPLASTY Left 09/21/2016   Procedure: Peripheral Vascular Balloon Angioplasty;  Surgeon: Waynetta Sandy, MD;  Location: [facility];  Service: Cardiovascular;  Laterality: Left;  drug coated balloon   PERIPHERAL VASCULAR BALLOON ANGIOPLASTY Right 06/27/2017   Procedure: PERIPHERAL VASCULAR BALLOON ANGIOPLASTY;  Surgeon: Waynetta Sandy, MD;  Location: [facility];   Service: Cardiovascular;  Laterality: Right;  SFA/TPTRUNK   TONSILLECTOMY     TUBAL LIGATION     VENTRAL HERNIA REPAIR  2015   incarcerated ventral hernia ([facility] 09/2013)/notes 01/10/2015    Current Meds  Medication Sig   acetaminophen (TYLENOL) 500 MG tablet Take 500 mg by mouth every 6 (six) hours as needed (pain).    Amino Acids-Protein Hydrolys (FEEDING SUPPLEMENT, PRO-STAT SUGAR FREE 64,) LIQD Take 30 mLs by mouth 2 (two) times daily.   atorvastatin (LIPITOR) 10 MG tablet TAKE 1 TABLET BY MOUTH EVERY DAY   b complex vitamins tablet Take 1 tablet by mouth daily.    calcium-vitamin D (OSCAL WITH D) 500-200 MG-UNIT per tablet Take 1 tablet by mouth daily with breakfast.    Carboxymethylcellulose Sodium (ARTIFICIAL TEARS OP) Place 1 drop into both eyes 4 (four) times daily.   carvedilol (COREG) 6.25 MG tablet Take 1 tablet (6.25 mg total) by mouth 2 (two) times daily.   collagenase (SANTYL) ointment Apply 1 application topically daily as needed. For wound care   cycloSPORINE (RESTASIS) 0.05 % ophthalmic emulsion USE 1 DROP INTO BOTH EYES TWICE DAILY   feeding supplement (OSMOLITE 1 CAL) LIQD Take 711 mLs by mouth See admin instructions. For 12 hours 2100 - 0900   ferrous sulfate 325 (65 FE) MG tablet Take 1 tablet (325 mg total) by mouth daily.   fluticasone (FLONASE) 50 MCG/ACT nasal spray Spray 2 sprays into each nostril once daily   furosemide (LASIX) 40 MG tablet TAKE 1 TABLET BY MOUTH EVERY DAY   gabapentin (NEURONTIN) 300 MG capsule TAKE 1 CAPSULE BY MOUTH TWICE A DAY   gentamicin cream (GARAMYCIN) 0.1 % Apply 1 application topically 3 (three) times daily as needed.   hydrALAZINE (APRESOLINE) 50 MG tablet TAKE 1 TABLET BY MOUTH THREE TIMES A DAY   insulin aspart (NOVOLOG) 100 UNIT/ML FlexPen Inject 0-6 Units into the skin 3 (three) times daily with meals. New sliding Scale If BS is 151 - 251 = 2 units,  If BS is 251 -  350 = 4 units,  If BS is 351 - 450 = 6 units Call  provider if BS runs more than 400   insulin glargine (LANTUS) 100 UNIT/ML injection Inject 8 Units into the skin at bedtime.    Insulin Pen Needle (B-D UF III MINI PEN NEEDLES) 31G X 5 MM  MISC Use twice daily with giving insulin injections. Dx E11.9   ipratropium-albuterol (DUONEB) 0.5-2.5 (3) MG/3ML SOLN Take 3 mLs by nebulization every 6 (six) hours as needed (sob). Use 3 times daily x 4 days then every 6 hours as needed.   isosorbide mononitrate (IMDUR) 30 MG 24 hr tablet TAKE 2 TABLETS (60 MG TOTAL) BY MOUTH DAILY.   latanoprost (XALATAN) 0.005 % ophthalmic solution Place 1 drop into both eyes at bedtime.   levalbuterol (XOPENEX HFA) 45 MCG/ACT inhaler Inhale 2 puffs into the lungs every 6 (six) hours as needed for wheezing or shortness of breath.   levothyroxine (SYNTHROID, LEVOTHROID) 125 MCG tablet Take 1 tablet (125 mcg total) by mouth daily.   loratadine (CLARITIN) 10 MG tablet Take 10 mg by mouth daily as needed for allergies.   lubiprostone (AMITIZA) 8 MCG capsule Take 1 capsule (8 mcg total) by mouth 2 (two) times daily with a meal.   mometasone-formoterol (DULERA) 200-5 MCG/ACT AERO TAKE TWO PUFFS BY MOUTH TWICE DAILY   Multiple Vitamin (MULTIVITAMIN WITH MINERALS) TABS tablet Take 1 tablet by mouth daily.   nystatin (MYCOSTATIN/NYSTOP) powder Apply 1 g topically as needed.   oxyCODONE (ROXICODONE) 5 MG immediate release tablet Take 1 tablet (5 mg total) by mouth every 4 (four) hours as needed for severe pain. May take 1/2 tab q4hrs as needed for mild pain.   OXYGEN Inhale 2 L into the lungs at bedtime.   pantoprazole (PROTONIX) 40 MG tablet Take 1 tablet (40 mg total) by mouth 2 (two) times daily.   sertraline (ZOLOFT) 100 MG tablet TAKE 1 TABLET BY MOUTH EVERY DAY FOR DEPRESSION   warfarin (COUMADIN) 2.5 MG tablet Take by mouth See admin instructions. Take 5 mg every evening except Friday take 2.5 mg in the evening.   Water For Irrigation, Sterile (FREE WATER) SOLN  Place 150 mLs into feeding tube 3 (three) times daily.    White Petrolatum-Mineral Oil (SYSTANE NIGHTTIME) OINT Place 1 application into both eyes at bedtime.     12 system ROS was negative unless otherwise noted in HPI  Observations/Objective: Vitals:   08/30/18 1456  BP: 123/64  Pulse: 68  Patient is awake alert answers questions appropriately Nonlabored respirations speaking without limitation Right great toe amputation site appears to be well-healed and although the camera is somewhat unclear appears to be distal tip ulceration of the second and third toe at least possibly involving the fourth  Assessment and Plan: 85 year old female previous right lower extremity revascularization including balloon angioplasty of the SFA and tibioperoneal trunk over 1 year ago.  More recently underwent right great toe amputation which has healed well.  Now following with Dr. Amalia Hailey for wounds of the right second third and fourth toes and has completed antibiotics.  Family calls with concern for worsening wounds.  I discussed options being continued care with Dr. Amalia Hailey versus scheduling angiography versus in person evaluation next week.  We will get her scheduled for ABI and evaluation on Tuesday with either Dr. Donnetta Hutching or Carlis Abbott.  They understand outcomes are possible angiography versus further toe amputation.   I discussed the assessment and treatment plan with the patient. The patient was provided an opportunity to ask questions and all were answered. The patient agreed with the plan and demonstrated an understanding of the instructions.   The patient was advised to call back or seek an in-person evaluation if the symptoms worsen or if the condition fails to improve as anticipated.  I spent 12  minutes with the patient via video encounter.   Signed, Servando Snare [facility] Office: [phone]  08/30/2018, 3:16 PM

## 2018-08-30 NOTE — Telephone Encounter (Signed)
Spoke with pts daughter and she is aware and knows to follow-up with PCP and surgeon.

## 2018-08-31 ENCOUNTER — Other Ambulatory Visit: Payer: Self-pay | Admitting: Cardiovascular Disease

## 2018-09-02 ENCOUNTER — Other Ambulatory Visit: Payer: Self-pay

## 2018-09-02 ENCOUNTER — Telehealth (HOSPITAL_COMMUNITY): Payer: Self-pay | Admitting: Rehabilitation

## 2018-09-02 DIAGNOSIS — I739 Peripheral vascular disease, unspecified: Secondary | ICD-10-CM

## 2018-09-02 NOTE — Telephone Encounter (Signed)

## 2018-09-03 ENCOUNTER — Other Ambulatory Visit: Payer: Self-pay

## 2018-09-03 ENCOUNTER — Ambulatory Visit (HOSPITAL_COMMUNITY)
Admission: RE | Admit: 2018-09-03 | Discharge: 2018-09-03 | Disposition: A | Discharge status: Home / Self Care | Payer: Medicare Other | Source: Ambulatory Visit | Attending: Vascular Surgery | Admitting: Vascular Surgery

## 2018-09-03 ENCOUNTER — Encounter: Payer: Self-pay | Admitting: Vascular Surgery

## 2018-09-03 ENCOUNTER — Ambulatory Visit (INDEPENDENT_AMBULATORY_CARE_PROVIDER_SITE_OTHER): Payer: Medicare Other | Admitting: Vascular Surgery

## 2018-09-03 VITALS — BP 142/71 | HR 71 | Temp 97.2°F | Resp 20 | Ht 66.0 in | Wt 145.0 lb

## 2018-09-03 DIAGNOSIS — I739 Peripheral vascular disease, unspecified: Secondary | ICD-10-CM

## 2018-09-03 DIAGNOSIS — I779 Disorder of arteries and arterioles, unspecified: Secondary | ICD-10-CM

## 2018-09-03 NOTE — Progress Notes (Signed)
V[facility] and [facility] Specialist of [place]  Patient name: Shannon [name] MRN: [id number] DOB: 1924/01/11 Sex: female  REASON FOR VISIT: Evaluation of ulcerations on the tips of second third and fourth toes.  HPI: Shannon [name] is a 85 y.o. female well-known to our service from prior interventions and great toe amputation.  She had undergone right SFA angioplasty and tibioperoneal trunk angioplasty with Dr.Cain.  She had had excellent healing.  Recently she had some excoriation on the tips of her toes and is seen for further evaluation of this.  Past Medical History:  Diagnosis Date  . Acute respiratory failure (HCC)   . Anemia    previous blood transfusions  . Arthritis    "all over"  . Asthma   . Atrial flutter (HCC)   . Bradycardia    requiring previous d/c of BB and reduction of amiodarone  . CAD (coronary artery disease)    nonobstructive per notes  . Chronic diastolic CHF (congestive heart failure) (HCC)   . CKD (chronic kidney disease), stage III (HCC)   . Complication of blood transfusion    "got the wrong blood type at [facility] in ~ 2015; no adverse reaction that we are aware of"/daughter, [name] (01/27/2016)  . COPD (chronic obstructive pulmonary disease) (HCC)   . Depression    "light case"  . DVT (deep venous thrombosis) (HCC) 01/2016   a. LLE DVT 01/2016 - switched from Eliquis to Coumadin.  [name] Dyspnea    with some activity  . Gastric stenosis    a. s/p stomach tube  . GERD (gastroesophageal reflux disease)   . History of blood transfusion    "several" (01/27/2016)  . History of stomach ulcers   . Hyperlipidemia   . Hypertension   . Hypothyroidism   . On home oxygen therapy    "2L; 9PM til 9AM" (06/27/2017)  . PAD (peripheral artery disease) (HCC)    a. prior gangrene, toe amputations, intervention.  [name] PAF (paroxysmal atrial fibrillation) (HCC)   . Paraesophageal hernia   . Perforated gastric ulcer (HCC)   . Pneumonia     "a few times" (06/27/2017)  . Seasonal allergies   . SIADH (syndrome of inappropriate ADH production) (HCC)    [name] 01/10/2015  . Small bowel obstruction (HCC)    "I don't know how many" (01/11/2015)  . Stroke [facility])    several- no residual  . Type II diabetes mellitus (HCC)    "related to prednisone use  for > 20 yr; once predinose stopped; no more DM RX" (01/27/2016)  . UTI (urinary tract infection) 02/08/2016  . Ventral hernia with bowel obstruction     Family History  Problem Relation Age of Onset  . Stroke Mother   . Hypertension Mother   . Diabetes Brother   . Glaucoma Son   . Glaucoma Son   . Heart attack Neg Hx   . Colon cancer Neg Hx   . Stomach cancer Neg Hx     SOCIAL HISTORY: Social History   Tobacco Use  . Smoking status: Never Smoker  . Smokeless tobacco: Former Neurosurgeon    Types: Snuff  . Tobacco comment: "used snuff in my younger days"  Substance Use Topics  . Alcohol use: Never    Alcohol/week: 0.0 standard drinks    Frequency: Never    Allergies  Allergen Reactions  . Penicillins Itching, Rash and Other (See Comments)    Tolerated amoxicillin, unasyn, zosyn & cephalosporins in the past. Did  it involve swelling of the face/tongue/throat, SOB, or low BP? No Did it involve sudden or severe rash/hives, skin peeling, or any reaction on the inside of your mouth or nose? No Did you need to seek medical attention at a hospital or doctor's office? Unknown When did it last happen?5+ years If all above answers are "NO", may proceed with cephalosporin use.     Current Outpatient Medications  Medication Sig Dispense Refill  . acetaminophen (TYLENOL) 500 MG tablet Take 500 mg by mouth every 6 (six) hours as needed (pain).     . Amino Acids-Protein Hydrolys (FEEDING SUPPLEMENT, PRO-STAT SUGAR FREE 64,) LIQD Take 30 mLs by mouth 2 (two) times daily.    [name] atorvastatin (LIPITOR) 10 MG tablet TAKE 1 TABLET BY MOUTH EVERY DAY 90 tablet 2  . b complex vitamins  tablet Take 1 tablet by mouth daily.     . calcium-vitamin D (OSCAL WITH D) 500-200 MG-UNIT per tablet Take 1 tablet by mouth daily with breakfast.     . Carboxymethylcellulose Sodium (ARTIFICIAL TEARS OP) Place 1 drop into both eyes 4 (four) times daily.    . carvedilol (COREG) 6.25 MG tablet Take 1 tablet (6.25 mg total) by mouth 2 (two) times daily. 180 tablet 1  . collagenase (SANTYL) ointment Apply 1 application topically daily as needed. For wound care    . cycloSPORINE (RESTASIS) 0.05 % ophthalmic emulsion USE 1 DROP INTO BOTH EYES TWICE DAILY 60 mL 3  . feeding supplement (OSMOLITE 1 CAL) LIQD Take 711 mLs by mouth See admin instructions. For 12 hours 2100 - 0900    . ferrous sulfate 325 (65 FE) MG tablet Take 1 tablet (325 mg total) by mouth daily. 90 tablet 1  . fluticasone (FLONASE) 50 MCG/ACT nasal spray Spray 2 sprays into each nostril once daily 16 g 3  . furosemide (LASIX) 40 MG tablet TAKE 1 TABLET BY MOUTH EVERY DAY 90 tablet 1  . gabapentin (NEURONTIN) 300 MG capsule TAKE 1 CAPSULE BY MOUTH TWICE A DAY 180 capsule 1  . gentamicin cream (GARAMYCIN) 0.1 % Apply 1 application topically 3 (three) times daily as needed.    . hydrALAZINE (APRESOLINE) 50 MG tablet TAKE 1 TABLET BY MOUTH THREE TIMES A DAY 270 tablet 1  . insulin aspart (NOVOLOG) 100 UNIT/ML FlexPen Inject 0-6 Units into the skin 3 (three) times daily with meals. New sliding Scale If BS is 151 - 251 = 2 units,  If BS is 251 -  350 = 4 units,  If BS is 351 - 450 = 6 units Call provider if BS runs more than 400    . insulin glargine (LANTUS) 100 UNIT/ML injection Inject 8 Units into the skin at bedtime.     . Insulin Pen Needle (B-D UF III MINI PEN NEEDLES) 31G X 5 MM MISC Use twice daily with giving insulin injections. Dx E11.9 300 each 3  . ipratropium-albuterol (DUONEB) 0.5-2.5 (3) MG/3ML SOLN Take 3 mLs by nebulization every 6 (six) hours as needed (sob). Use 3 times daily x 4 days then every 6 hours as needed. 360 mL 4   . isosorbide mononitrate (IMDUR) 30 MG 24 hr tablet TAKE 2 TABLETS (60 MG TOTAL) BY MOUTH DAILY. 180 tablet 1  . latanoprost (XALATAN) 0.005 % ophthalmic solution Place 1 drop into both eyes at bedtime. 2.5 mL 12  . levalbuterol (XOPENEX HFA) 45 MCG/ACT inhaler Inhale 2 puffs into the lungs every 6 (six) hours as needed for wheezing or  shortness of breath. 1 Inhaler 12  . levothyroxine (SYNTHROID, LEVOTHROID) 125 MCG tablet Take 1 tablet (125 mcg total) by mouth daily. 90 tablet 3  . loratadine (CLARITIN) 10 MG tablet Take 10 mg by mouth daily as needed for allergies.    [name] lubiprostone (AMITIZA) 8 MCG capsule Take 1 capsule (8 mcg total) by mouth 2 (two) times daily with a meal. 60 capsule 1  . mometasone-formoterol (DULERA) 200-5 MCG/ACT AERO TAKE TWO PUFFS BY MOUTH TWICE DAILY 13 Inhaler 1  . Multiple Vitamin (MULTIVITAMIN WITH MINERALS) TABS tablet Take 1 tablet by mouth daily.    [name] nystatin (MYCOSTATIN/NYSTOP) powder Apply 1 g topically as needed.    [name] oxyCODONE (ROXICODONE) 5 MG immediate release tablet Take 1 tablet (5 mg total) by mouth every 4 (four) hours as needed for severe pain. May take 1/2 tab q4hrs as needed for mild pain. 30 tablet 0  . OXYGEN Inhale 2 L into the lungs at bedtime.    . pantoprazole (PROTONIX) 40 MG tablet Take 1 tablet (40 mg total) by mouth 2 (two) times daily. 180 tablet 1  . sertraline (ZOLOFT) 100 MG tablet TAKE 1 TABLET BY MOUTH EVERY DAY FOR DEPRESSION 90 tablet 1  . warfarin (COUMADIN) 2.5 MG tablet TAKE AS DIRECTED BY COUMADIN CLINIC. 180 tablet 1  . Water For Irrigation, Sterile (FREE WATER) SOLN Place 150 mLs into feeding tube 3 (three) times daily.     Cliffton Asters Petrolatum-[place] Oil (SYSTANE NIGHTTIME) OINT Place 1 application into both eyes at bedtime.      No current facility-administered medications for this visit.     REVIEW OF SYSTEMS:  [X]  denotes positive finding, [ ]  denotes negative finding Cardiac  Comments:  Chest pain or chest pressure:     Shortness of breath upon exertion:    Short of breath when lying flat:    Irregular heart rhythm:        Vascular    Pain in calf, thigh, or hip brought on by ambulation:    Pain in feet at night that wakes you up from your sleep:     Blood clot in your veins:    Leg swelling:           PHYSICAL EXAM: Vitals:   09/03/18 1555  BP: (!) 142/71  Pulse: 71  Resp: 20  Temp: (!) 97.2 F (36.2 C)  SpO2: 96%  Weight: 145 lb (65.8 kg)  Height: 5\' 6"  (1.676 m)    GENERAL: The patient is a well-nourished female, in no acute distress. The vital signs are documented above. CARDIOVASCULAR: Did not palpate pedal pulses. PULMONARY: There is good air exchange  MUSCULOSKELETAL: There are no major deformities or cyanosis. NEUROLOGIC: No focal weakness or paresthesias are detected. SKIN: Superficial ulcerations on the tips of her toes.  Surgical absence of the right great toe. PSYCHIATRIC: The patient has a normal affect.  DATA:  Normal ankle arm index on the right.  I listen to her fetal flow with hand-held Doppler.  She has excellent biphasic posterior tibial and peroneal signal and strong monophasic flow in her dorsalis pedis  MEDICAL ISSUES: Appears to have excellent flow to her right foot.  I do not recommend any further invasive evaluation.  She will continue to follow-up with Dr. Logan Bores for local care to her toe ulcerations and see [place] again as needed    Larina Earthly, MD [facility] Vascular and Vein Specialists of [facility] Tel [phone] Pager [phone]

## 2018-09-04 NOTE — Progress Notes (Signed)
HPI: 85 year old female presents the office today for follow-up evaluation of recurrent ulcerations that have developed to digits 2-4 of the right foot.  Patient underwent partial first ray amputation by vascular surgeon, Dr. Curt Jews, on 05/03/2018 that healed uneventfully.  Patient has been applying Betadine with light dressing every other day as instructed.  She presents today for further treatment and evaluation  Past Medical History:  Diagnosis Date  . Acute respiratory failure ([place])   . Anemia    previous blood transfusions  . Arthritis    "all over"  . Asthma   . Atrial flutter ([place])   . Bradycardia    requiring previous d/c of BB and reduction of amiodarone  . CAD (coronary artery disease)    nonobstructive per notes  . Chronic diastolic CHF (congestive heart failure) ([place])   . CKD (chronic kidney disease), stage III ([place])   . Complication of blood transfusion    "got the wrong blood type at [facility] in ~ 2015; no adverse reaction that we are aware of"/daughter, [name] (01/27/2016)  . COPD (chronic obstructive pulmonary disease) ([place])   . Depression    "light case"  . DVT (deep venous thrombosis) ([place]) 01/2016   a. LLE DVT 01/2016 - switched from Eliquis to Coumadin.  [name] Dyspnea    with some activity  . Gastric stenosis    a. s/p stomach tube  . GERD (gastroesophageal reflux disease)   . History of blood transfusion    "several" (01/27/2016)  . History of stomach ulcers   . Hyperlipidemia   . Hypertension   . Hypothyroidism   . On home oxygen therapy    "2L; 9PM til 9AM" (06/27/2017)  . PAD (peripheral artery disease) ([place])    a. prior gangrene, toe amputations, intervention.  [name] PAF (paroxysmal atrial fibrillation) ([place])   . Paraesophageal hernia   . Perforated gastric ulcer ([place])   . Pneumonia    "a few times" (06/27/2017)  . Seasonal allergies   . SIADH (syndrome of inappropriate ADH production) ([place])    [name] 01/10/2015  . Small bowel obstruction ([place])    "I don't know how many" (01/11/2015)  . Stroke [facility])    several- no residual  . Type II diabetes mellitus ([place])    "related to prednisone use  for > 20 yr; once predinose stopped; no more DM RX" (01/27/2016)  . UTI (urinary tract infection) 02/08/2016  . Ventral hernia with bowel obstruction      Physical Exam: General: The patient is alert and oriented x3 in no acute distress.  Dermatology: Skin is warm, dry and supple bilateral lower extremities.  Ulcers noted encompassing the entire digits 2-4 right foot.  Maceration noted with modest improvement since last visit.  Mild malodor noted.  Wound base is a combination of fibrotic and granular tissue.  Wounds measure approximately 1.01.0 x 0.3 cm to all the digits.  Vascular: Diminished but palpable pedal pulses bilaterally. No edema or erythema noted.   Neurological: Epicritic and protective threshold absent bilaterally.   Musculoskeletal Exam: Status post partial first ray amputation right foot with complete healing  Assessment: 1.  Ulcer digit 2-4 right foot with fat layer exposed 2.  Diabetes mellitus 3.  Peripheral vascular disease 4.  Status post partial first ray amputation right   Plan of Care:  1. Patient evaluated. 2.  Medically necessary excisional debridement including subcutaneous tissue was performed using a tissue nipper.  Excisional debridement of all necrotic nonviable tissue down to  healthy bleeding viable tissue was performed with post debridement measurement same as pre- 3.  Today we discussed with the patient and the patient's daughter options regarding management of the wounds.  The ulcerations are chronic on the tips of the toes with likely some osteomyelitis to the distal phalanx of the digits as well.  This is obviously chronic and stable at the moment. 4.  Surgery would consist of transmetatarsal amputation.  The patient is 85 years old and I would like to avoid surgery, however we have been doing wound care on  these toes for approximately 1 year now without any significant improvement.  I again addressed the treatment options with the patient and daughter today.  Patient would like to get a second opinion with Dr. Curt Jews, vascular who did her partial first ray amputation on 05/03/2018.  I would appreciate a second opinion and recommend that she follows up with Dr. Donnetta Hutching to discuss transmetatarsal amputation versus conservative wound care.  The daughter states that she will make an appointment for the patient. 5.  Continue Betadine with dry sterile dressing every other day 6.  Return to clinic in 3 weeks Edrick Kins, DPM [facility]  Dr. Edrick Kins, DPM    [address], [place] [address]                Office [phone]  Fax [phone]

## 2018-09-05 ENCOUNTER — Telehealth: Payer: Self-pay

## 2018-09-05 NOTE — Telephone Encounter (Signed)
lmom for prescreen in office aware 

## 2018-09-06 ENCOUNTER — Other Ambulatory Visit: Payer: Self-pay | Admitting: *Deleted

## 2018-09-06 NOTE — Patient Outreach (Signed)
[facility]) Care Management  09/06/2018  GRAYCE BUDDEN 13-Dec-1923 [id number]  Telephone Assessment (heart Failure)  RN spoke with both the pt and daughter [name]). RN inquired on self management of care as pt reports the following weights for today 140 lbs, yesterday 141 lbs and last week reports the same weights.  Will verified pt is aware of what to do if acute symptoms should occur by contacting her provider however if acute pt aware to seek immediately medical care. RN verified pt remains in the GREEN zone and continues to tolerate a liquid diet during the day and tube feeds through her G-Tube at night. Plan of care reviewed with goals and interventions adjusted accordingly. RN continued to encourage ongoing adherence through education and verified pt received the printed material mailed last month. Ongoing education provided as pt verbalized an understanding.  Offered to follow up next month with pt's ongoing adherence and inquired on pt's management of care with daily weights and low sodium dietary habits with her daily liquids. No additional resources needed at this time. The following care plan remains in place. Will re-evaluate next month follow up call.   [name] CM Care Plan Problem One     Most Recent Value  Care Plan Problem One  Knowledge deficit related to HF  Role Documenting the Problem One  Care Management Coordinator  Care Plan for Problem One  Active  [name] Long Term Goal   Pt/fx will have an increased knowledge of HF management and what to do if acute symptoms should occurr with the next 90 days.  [name] Long Term Goal Start Date  08/07/18  Interventions for Problem One Long Term Goal  Will continue to reiterate on the HF zones and when to seek medical attention. Will extend to allow ongoing adherence with managing this goal and continue to encouraged pt and family to adhere.   [name] CM Short Term Goal #1   Pt will stay in the GREEN zone with no acute symptoms over the  next 30 days.  [name] CM Short Term Goal #1 Start Date  08/07/18  [name] CM Short Term Goal #1 Met Date  09/06/18  [name] CM Short Term Goal #2   Pt will complete daily weights and monitor any fluid retention based upon parameters discussed today to her provider in the next 30 days.  [name] CM Short Term Goal #2 Start Date  08/07/18  [facility] CM Short Term Goal #2 Met Date  09/06/18      [name], RN Care Management Coordinator [place] Office [phone]

## 2018-09-06 NOTE — Telephone Encounter (Addendum)
1. COVID-19 Pre-Screening Questions:  . In the past 7 to 10 days have you had a cough,  shortness of breath, headache, congestion, fever (100 or greater) body aches, chills, sore throat, or sudden loss of taste or sense of smell?  No . Have you been around anyone with known Covid 19.  No . Have you been around anyone who is awaiting Covid 19 test results in the past 7 to 10 days?  No . Have you been around anyone who has been exposed to Covid 19, or has mentioned symptoms of Covid 19 within the past 7 to 10 days?  No    2. Pt advised of visitor restrictions (no visitors allowed except if needed to conduct the visit - daughter states she needs to accompany patient for visit). Also advised to arrive at appointment time and wear a mask.

## 2018-09-13 ENCOUNTER — Other Ambulatory Visit: Payer: Self-pay

## 2018-09-13 ENCOUNTER — Ambulatory Visit (INDEPENDENT_AMBULATORY_CARE_PROVIDER_SITE_OTHER): Payer: Medicare Other | Admitting: *Deleted

## 2018-09-13 DIAGNOSIS — Z5181 Encounter for therapeutic drug level monitoring: Secondary | ICD-10-CM

## 2018-09-13 DIAGNOSIS — K311 Adult hypertrophic pyloric stenosis: Secondary | ICD-10-CM | POA: Diagnosis not present

## 2018-09-13 DIAGNOSIS — I4891 Unspecified atrial fibrillation: Secondary | ICD-10-CM | POA: Diagnosis not present

## 2018-09-13 DIAGNOSIS — I4892 Unspecified atrial flutter: Secondary | ICD-10-CM

## 2018-09-13 LAB — POCT INR: INR: 1.7 — AB (ref 2.0–3.0)

## 2018-09-13 NOTE — Patient Instructions (Signed)
Description   Today take 3 tablets then start taking 2 tablets daily. Recheck in 2 weeks.  Call Coumadin Clinic with any new medications [phone].

## 2018-09-14 IMAGING — DX DG CHEST 2V
2 series · 2 of 2 positions shown · non-contrast
Comparison: Chest x-ray of 03/10/2016 and 03/06/2016

CLINICAL DATA: Chronic cough and congestion with shortness of
breath, history of diabetes

EXAM:
CHEST  2 VIEW

[chest pa]
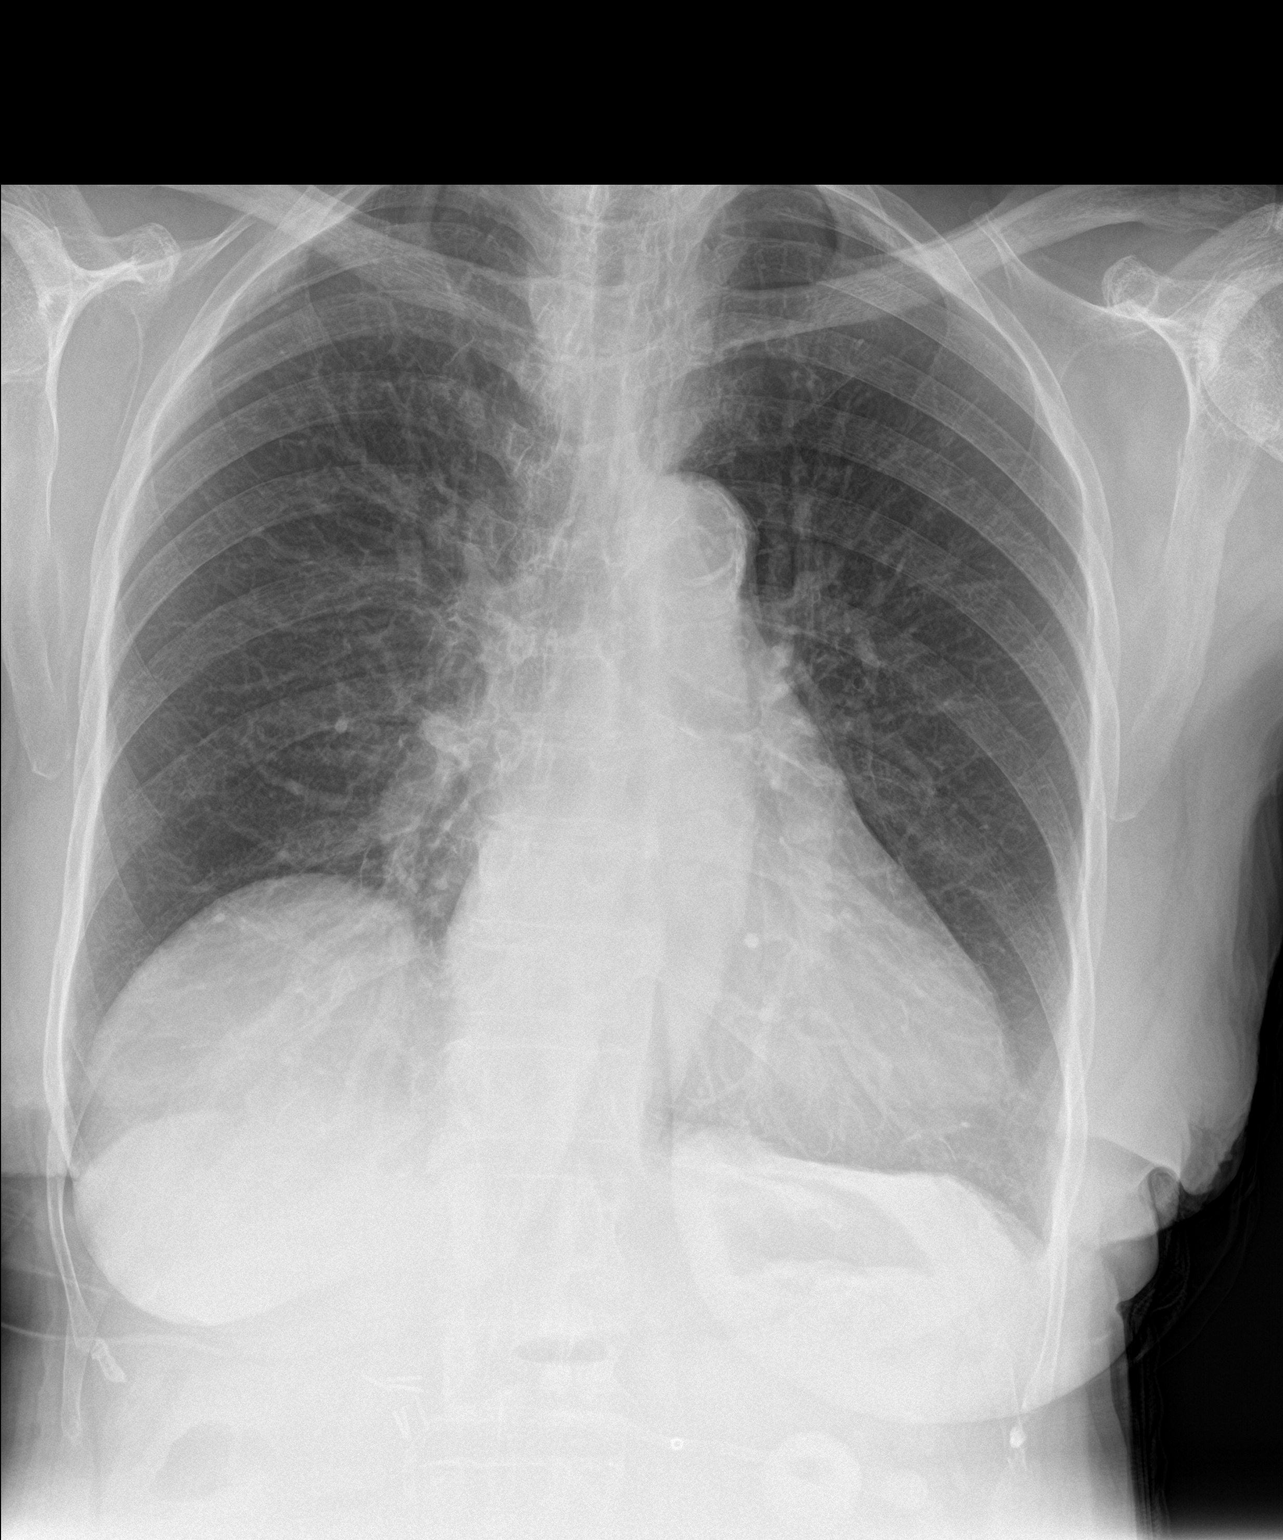

[chest lat]
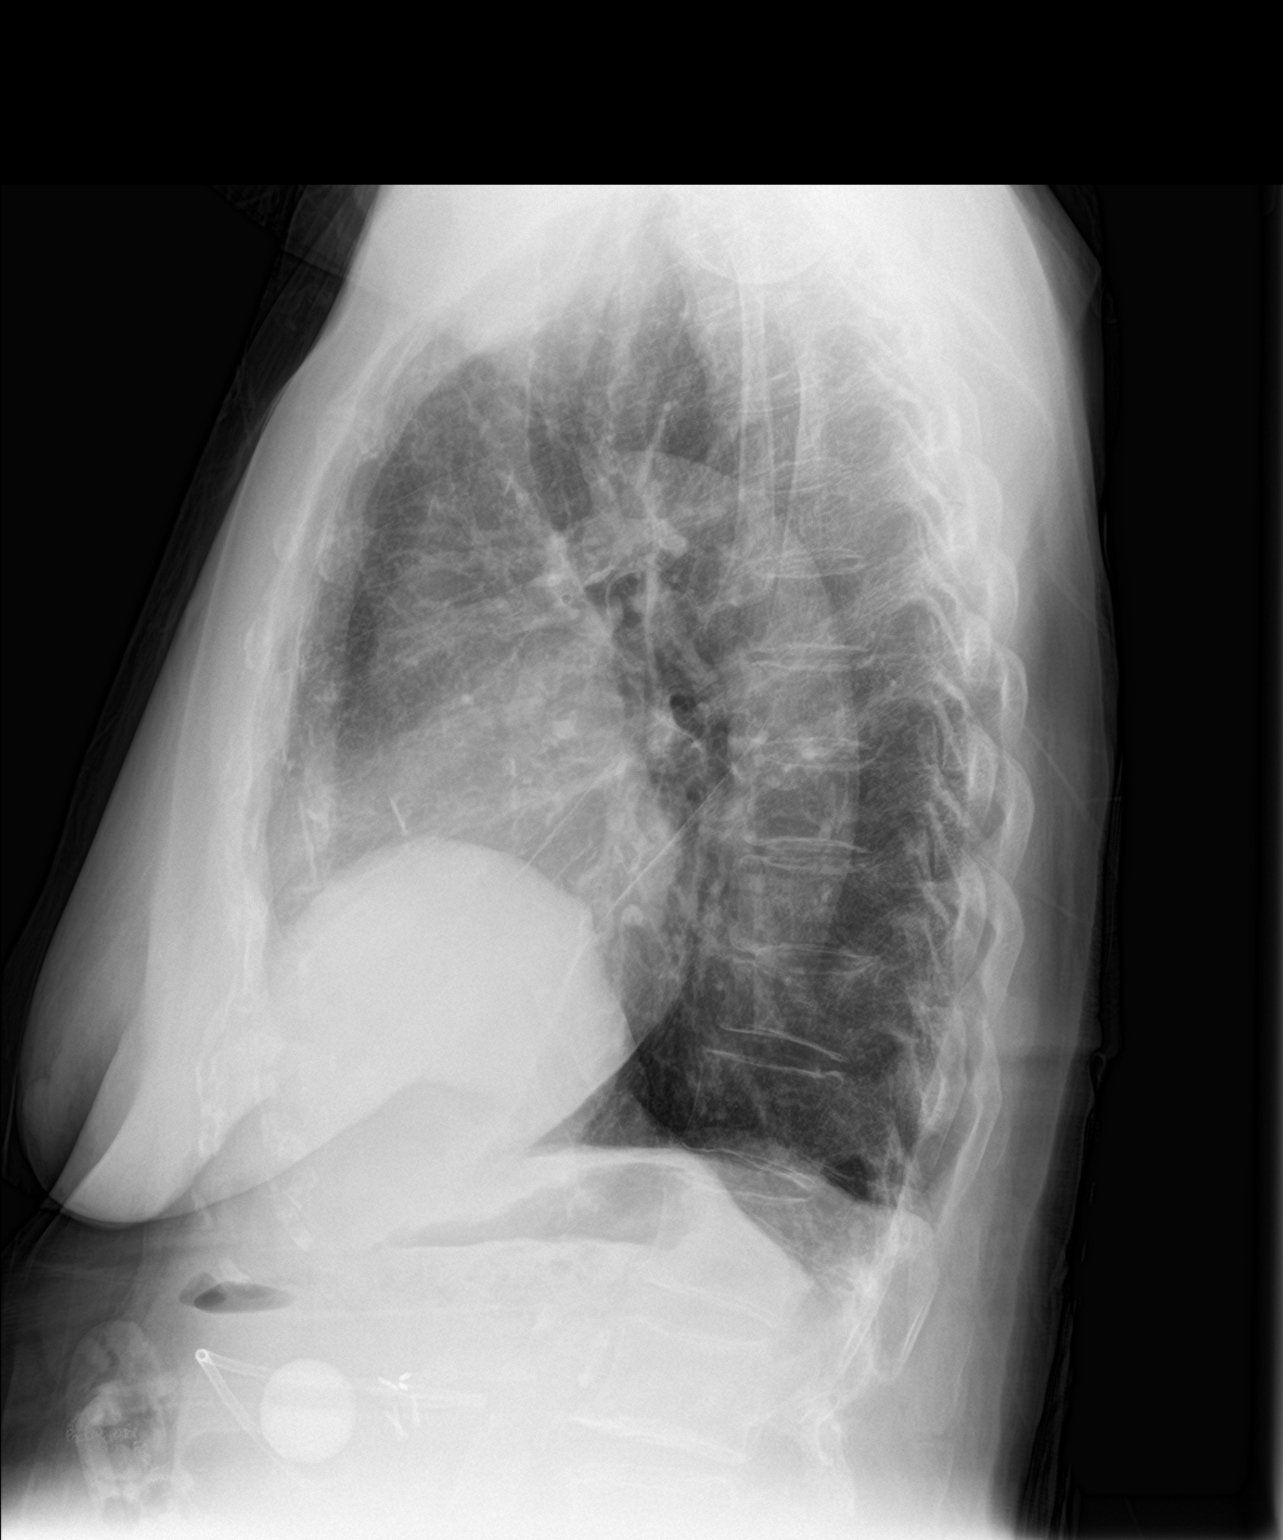

[2 of 2 positions shown; findings below may reference images not displayed]

FINDINGS: The previously noted left lower lobe opacity has cleared. No pleural
effusion is seen. The right lung is well aerated. Mediastinal and
hilar contours are unremarkable and cardiomegaly is stable. No acute
bony abnormality seen with probable old compression deformity of
what appears to be T11 vertebral body. NG tube remains.
IMPRESSION: 1. No definite pneumonia or effusion. Clearing of left basilar
opacity.
2. Stable cardiomegaly.
3. Apparent old partial compression deformity of T11 .

## 2018-09-16 ENCOUNTER — Other Ambulatory Visit (HOSPITAL_COMMUNITY): Payer: Self-pay | Admitting: General Surgery

## 2018-09-16 DIAGNOSIS — K311 Adult hypertrophic pyloric stenosis: Secondary | ICD-10-CM

## 2018-09-18 ENCOUNTER — Encounter (HOSPITAL_COMMUNITY): Payer: Self-pay | Admitting: Interventional Radiology

## 2018-09-18 ENCOUNTER — Other Ambulatory Visit: Payer: Self-pay

## 2018-09-18 ENCOUNTER — Ambulatory Visit (INDEPENDENT_AMBULATORY_CARE_PROVIDER_SITE_OTHER): Payer: Medicare Other | Admitting: Podiatry

## 2018-09-18 ENCOUNTER — Telehealth: Payer: Self-pay

## 2018-09-18 ENCOUNTER — Ambulatory Visit (HOSPITAL_COMMUNITY)
Admission: RE | Admit: 2018-09-18 | Discharge: 2018-09-18 | Disposition: A | Discharge status: Home / Self Care | Payer: Medicare Other | Source: Ambulatory Visit | Attending: General Surgery | Admitting: General Surgery

## 2018-09-18 DIAGNOSIS — K311 Adult hypertrophic pyloric stenosis: Secondary | ICD-10-CM | POA: Insufficient documentation

## 2018-09-18 DIAGNOSIS — L97512 Non-pressure chronic ulcer of other part of right foot with fat layer exposed: Secondary | ICD-10-CM | POA: Diagnosis not present

## 2018-09-18 DIAGNOSIS — E0859 Diabetes mellitus due to underlying condition with other circulatory complications: Secondary | ICD-10-CM

## 2018-09-18 DIAGNOSIS — Z4659 Encounter for fitting and adjustment of other gastrointestinal appliance and device: Secondary | ICD-10-CM | POA: Diagnosis not present

## 2018-09-18 DIAGNOSIS — I739 Peripheral vascular disease, unspecified: Secondary | ICD-10-CM

## 2018-09-18 DIAGNOSIS — I70235 Atherosclerosis of native arteries of right leg with ulceration of other part of foot: Secondary | ICD-10-CM

## 2018-09-18 DIAGNOSIS — Z431 Encounter for attention to gastrostomy: Secondary | ICD-10-CM | POA: Diagnosis not present

## 2018-09-18 HISTORY — PX: IR CM INJ ANY COLONIC TUBE W/FLUORO: IMG2336

## 2018-09-18 MED ORDER — IOHEXOL 300 MG/ML  SOLN
50.0000 mL | Freq: Once | INTRAMUSCULAR | Status: AC | PRN
  Administered 2018-09-18: 11:00:00 10 mL

## 2018-09-18 NOTE — Telephone Encounter (Signed)
1. COVID-19 Pre-Screening Questions:  . In the past 7 to 10 days have you had a cough,  shortness of breath, headache, congestion, fever (100 or greater) body aches, chills, sore throat, or sudden loss of taste or sense of smell?  no . Have you been around anyone with known Covid 19.  no . Have you been around anyone who is awaiting Covid 19 test results in the past 7 to 10 days?  no . Have you been around anyone who has been exposed to Covid 19, or has mentioned symptoms of Covid 19 within the past 7 to 10 days?  no    2. Pt advised of visitor restrictions (no visitors allowed except if needed to conduct the visit). Also advised to arrive at appointment time and wear a mask.  Daughter to come with and answered no to all prescreening questions as well and agrees to wear a mask

## 2018-09-22 NOTE — Progress Notes (Signed)
HPI: 85 year old female presents the office today for follow-up evaluation of ulcerations noted to digits 2-4 of the right foot. She states she is improving. She has been applying the Betadine and using the post op shoe as directed. She denies any worsening factors. Patient is here for further evaluation and treatment.   Past Medical History:  Diagnosis Date  . Acute respiratory failure ([place])   . Anemia    previous blood transfusions  . Arthritis    "all over"  . Asthma   . Atrial flutter ([place])   . Bradycardia    requiring previous d/c of BB and reduction of amiodarone  . CAD (coronary artery disease)    nonobstructive per notes  . Chronic diastolic CHF (congestive heart failure) ([place])   . CKD (chronic kidney disease), stage III ([place])   . Complication of blood transfusion    "got the wrong blood type at [facility] in ~ 2015; no adverse reaction that we are aware of"/daughter, [name] (01/27/2016)  . COPD (chronic obstructive pulmonary disease) ([place])   . Depression    "light case"  . DVT (deep venous thrombosis) ([place]) 01/2016   a. LLE DVT 01/2016 - switched from Eliquis to Coumadin.  [name] Dyspnea    with some activity  . Gastric stenosis    a. s/p stomach tube  . GERD (gastroesophageal reflux disease)   . History of blood transfusion    "several" (01/27/2016)  . History of stomach ulcers   . Hyperlipidemia   . Hypertension   . Hypothyroidism   . On home oxygen therapy    "2L; 9PM til 9AM" (06/27/2017)  . PAD (peripheral artery disease) ([place])    a. prior gangrene, toe amputations, intervention.  [name] PAF (paroxysmal atrial fibrillation) ([place])   . Paraesophageal hernia   . Perforated gastric ulcer ([place])   . Pneumonia    "a few times" (06/27/2017)  . Seasonal allergies   . SIADH (syndrome of inappropriate ADH production) ([place])    [name] 01/10/2015  . Small bowel obstruction ([place])    "I don't know how many" (01/11/2015)  . Stroke [facility])    several- no residual  . Type II  diabetes mellitus ([place])    "related to prednisone use  for > 20 yr; once predinose stopped; no more DM RX" (01/27/2016)  . UTI (urinary tract infection) 02/08/2016  . Ventral hernia with bowel obstruction      Physical Exam: General: The patient is alert and oriented x3 in no acute distress.  Dermatology: Skin is warm, dry and supple bilateral lower extremities.  Ulcers noted encompassing the entire digits 2-4 right foot.  Maceration noted with modest improvement since last visit.  Mild malodor noted.  Wound base is a combination of fibrotic and granular tissue.  Wounds measure approximately 0.5 x 0.5 x 0.2 cm to all the digits.  Vascular: Diminished but palpable pedal pulses bilaterally. No edema or erythema noted.   Neurological: Epicritic and protective threshold absent bilaterally.   Musculoskeletal Exam: Status post partial first ray amputation right foot with complete healing  Assessment: 1. Ulcer digit 2-4 right foot with fat layer exposed right  2. Diabetes mellitus 3. Peripheral vascular disease 4. Status post partial first ray amputation right   Plan of Care:  1. Patient evaluated. 2. Medically necessary excisional debridement including subcutaneous tissue was performed using a tissue nipper.  Excisional debridement of all necrotic nonviable tissue down to healthy bleeding viable tissue was performed with post debridement measurement same  as pre- 3. Continue conservative treatment at this time.  4. Continue using Betadine three times a week with a dry sterile bandage.  5. Continue using post op shoe.  6. Return to clinic in 3 weeks.    Edrick Kins, DPM [facility]  Dr. Edrick Kins, DPM    [address], [place] [address]                Office [phone]  Fax [phone]

## 2018-09-25 ENCOUNTER — Other Ambulatory Visit: Payer: Self-pay | Admitting: *Deleted

## 2018-09-25 ENCOUNTER — Other Ambulatory Visit: Payer: Self-pay

## 2018-09-25 ENCOUNTER — Ambulatory Visit (INDEPENDENT_AMBULATORY_CARE_PROVIDER_SITE_OTHER): Payer: Medicare Other | Admitting: *Deleted

## 2018-09-25 DIAGNOSIS — I4891 Unspecified atrial fibrillation: Secondary | ICD-10-CM | POA: Diagnosis not present

## 2018-09-25 DIAGNOSIS — I4892 Unspecified atrial flutter: Secondary | ICD-10-CM

## 2018-09-25 DIAGNOSIS — Z5181 Encounter for therapeutic drug level monitoring: Secondary | ICD-10-CM

## 2018-09-25 LAB — POCT INR: INR: 1.8 — AB (ref 2.0–3.0)

## 2018-09-25 MED ORDER — ATORVASTATIN CALCIUM 10 MG PO TABS: 10.0000 mg | ORAL_TABLET | Freq: Every day | ORAL | 2 refills | Status: DC

## 2018-09-25 NOTE — Patient Instructions (Addendum)
Description   Today take 3 tablets then start taking 2 tablets daily except 3 tablets on Sundays. Recheck in 2 weeks.  Call Coumadin Clinic with any new medications [phone].

## 2018-09-25 NOTE — Telephone Encounter (Signed)
[facility]

## 2018-09-26 ENCOUNTER — Ambulatory Visit
Admission: RE | Admit: 2018-09-26 | Discharge: 2018-09-26 | Disposition: A | Discharge status: Home / Self Care | Payer: Medicare Other | Source: Ambulatory Visit | Attending: Nurse Practitioner | Admitting: Nurse Practitioner

## 2018-09-26 DIAGNOSIS — R109 Unspecified abdominal pain: Secondary | ICD-10-CM

## 2018-09-26 DIAGNOSIS — K7689 Other specified diseases of liver: Secondary | ICD-10-CM | POA: Diagnosis not present

## 2018-10-03 ENCOUNTER — Other Ambulatory Visit: Payer: Self-pay | Admitting: Cardiology

## 2018-10-03 IMAGING — XA IR GI TUBE CONTRAST INJECTION
8 series · 9 of 9 positions shown · non-contrast
Comparison: none

INDICATION: Feeding difficulties, catheter dislodgement. Previous GJ exchange
02/08/2016.

[Series 1: single · 1 of 1 slices shown (1 of 4)]
[im 1/1]
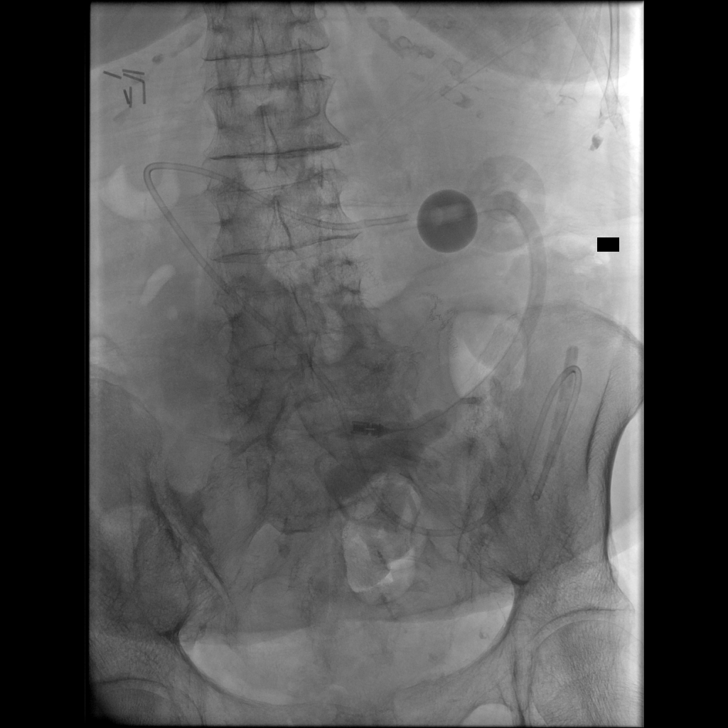

[Series 2: fl (-) angio · 2 of 2 slices shown (1 of 4)]
[im 1/2]
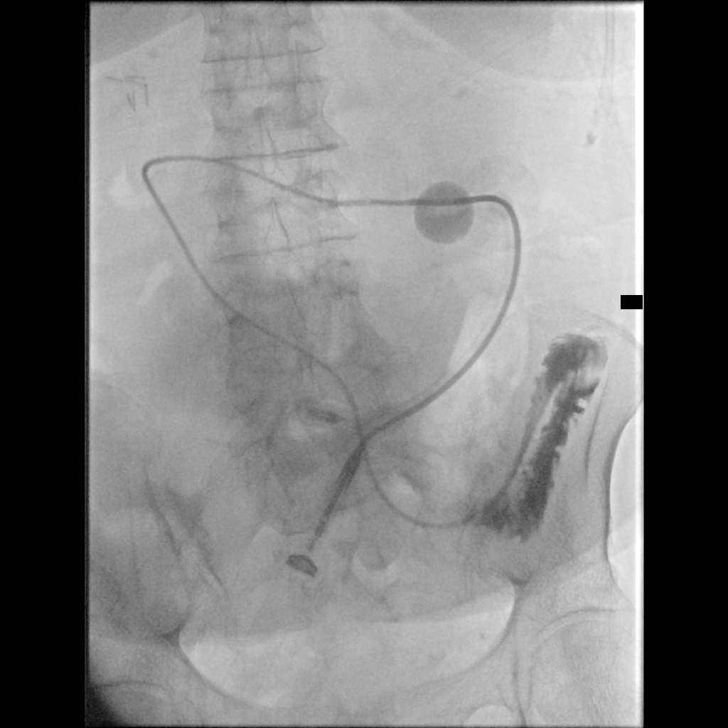
[im 2/2]
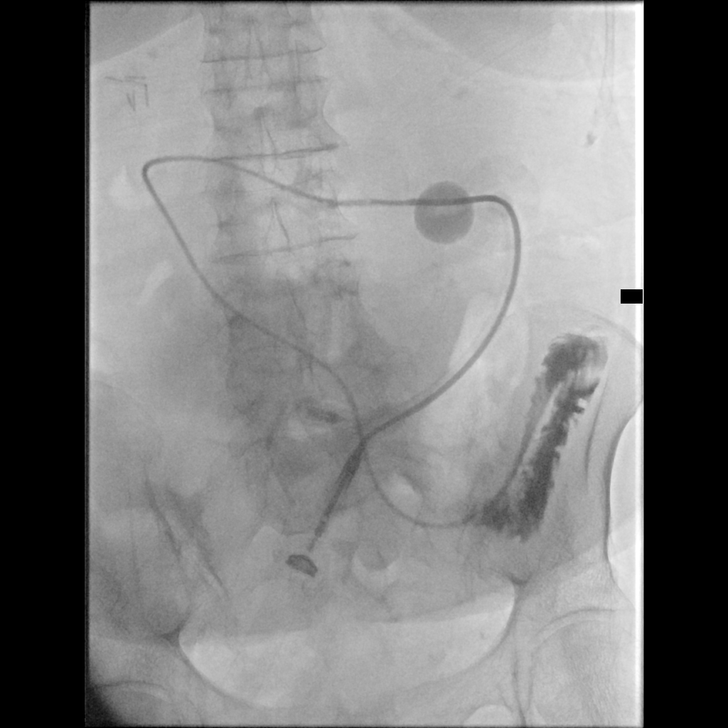

[Series 3: fl (-) angio · 1 of 1 slices shown (2 of 4)]
[im 1/1]
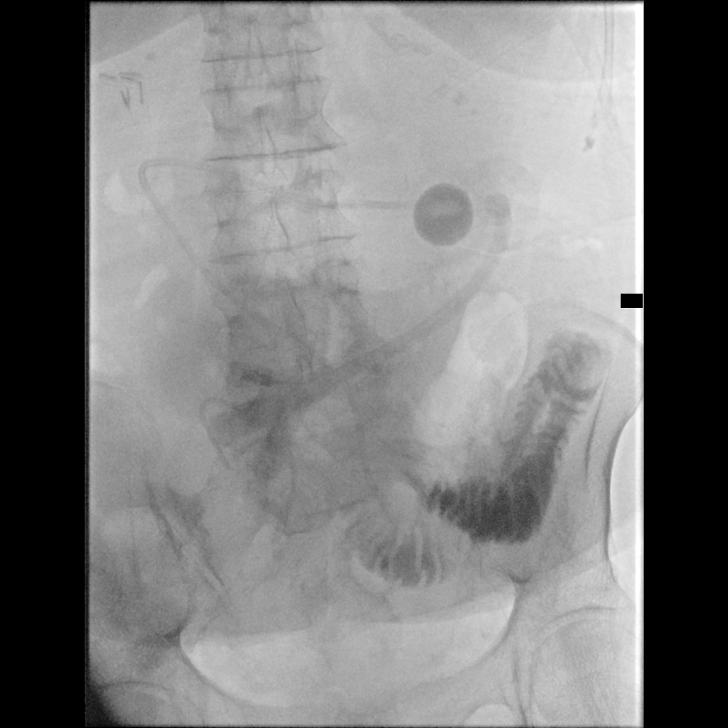

[Series 4: single · 1 of 1 slices shown (2 of 4)]
[im 1/1]
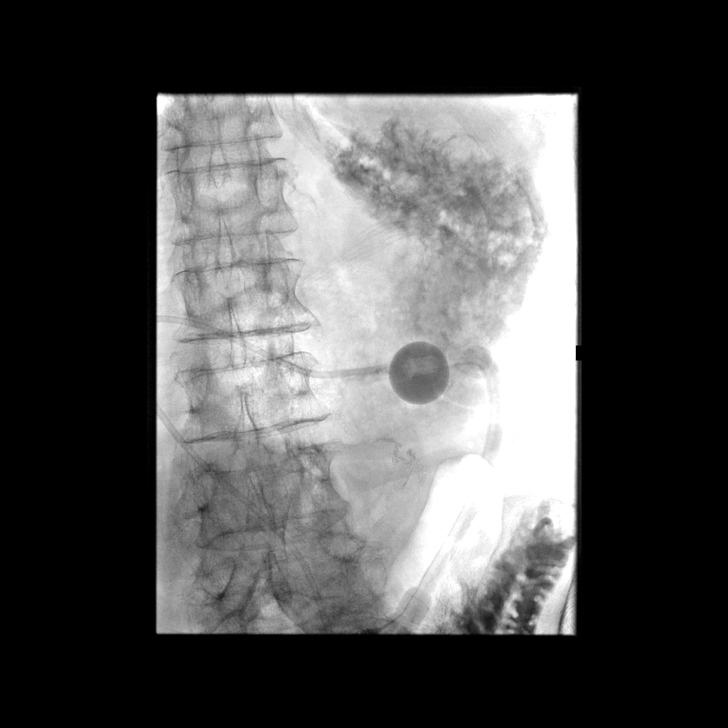

[Series 5: single · 1 of 1 slices shown (3 of 4)]
[im 1/1]
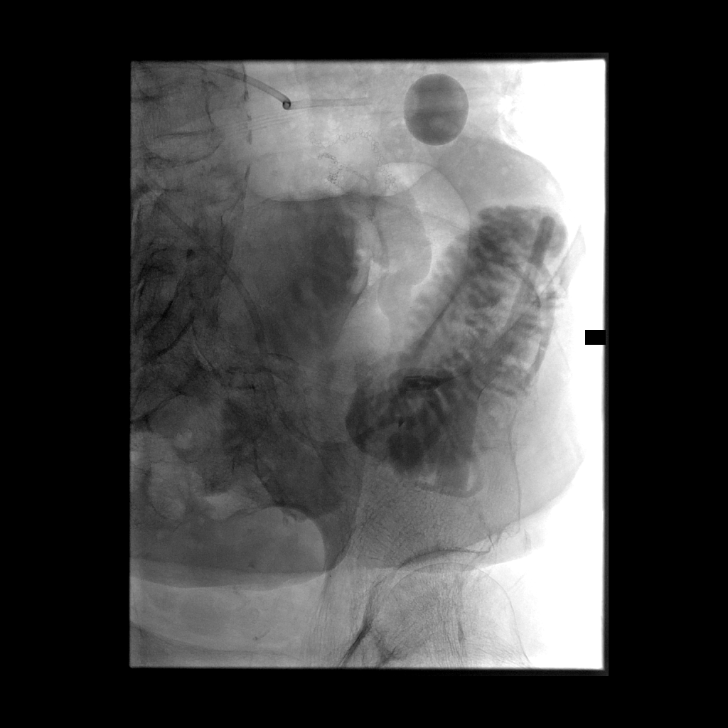

[Series 6: single · 1 of 1 slices shown (4 of 4)]
[im 1/1]
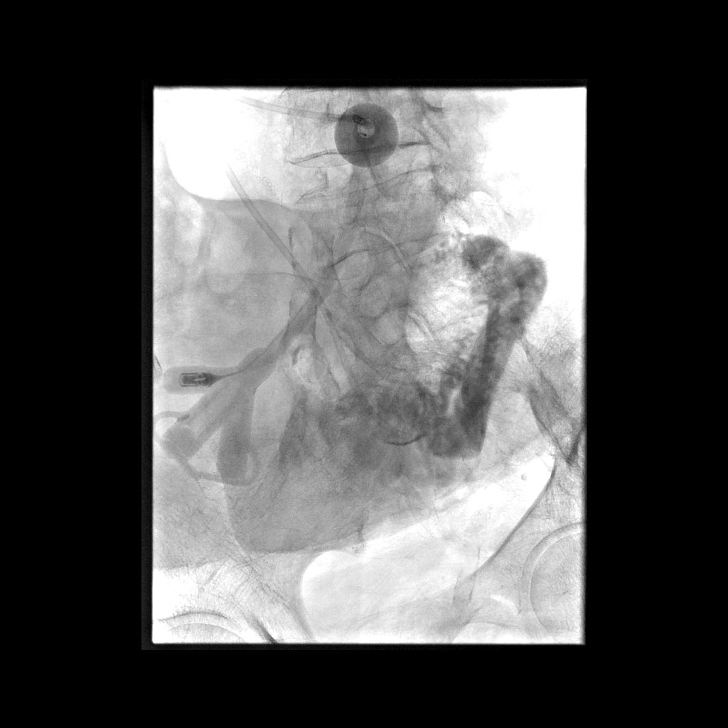

[Series 7: fl (-) angio · 1 of 1 slices shown (3 of 4)]
[im 1/1]
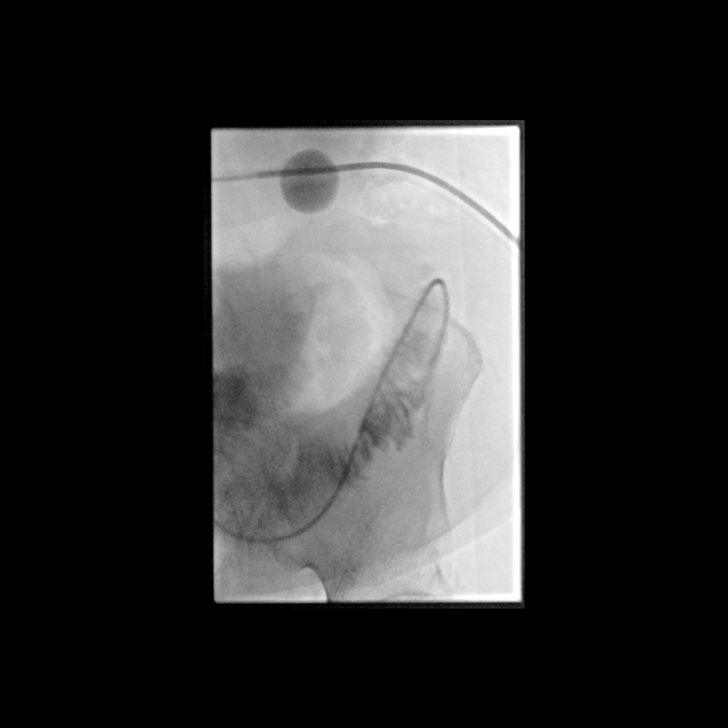

[Series 8: fl (-) angio · 1 of 1 slices shown (4 of 4)]
[im 1/1]
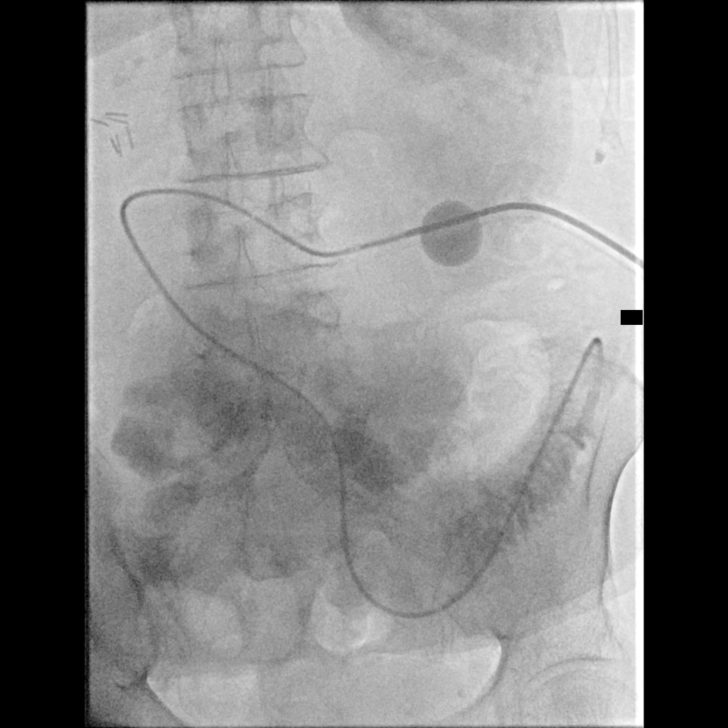

[9 of 9 positions shown; findings below may reference images not displayed]

EXAM:
GI TUBE INJECTION

FLUOROSCOPY TIME:  2 minutes 40 second, 25 mGy

COMPLICATIONS:
None immediate.

PROCEDURE:
Informed written consent was obtained from the patient after a
thorough discussion of the procedural risks, benefits and
alternatives. All questions were addressed. Sterile Technique was
utilized including . A timeout was performed prior to the initiation
of the procedure.

Initial fluoroscopic inspection demonstrated good position of the
retention balloon in the region the gastric lumen. The jejunostomy
limb extends beyond the ligament of Treitz. The weighted tip appears
folded back upon the catheter. Injection confirms patency of the
jejunal limb. An angled stiff glidewire was advanced through the
jejunal liMb to straighten the apparent kink in the distal catheter.
Final injection shows good position of the catheter, no kink, tip
beyond the ligament of Treitz, widely patent, okay for routine use.
IMPRESSION: Dual-lumen gastrojejunostomy catheter in good position, okay for
routine use.

## 2018-10-03 IMAGING — US US ABDOMEN LIMITED
1 series · 14 of 19 positions shown · non-contrast
Comparison: None.

CLINICAL DATA: Pain and swelling upper abdomen in region of
percutaneous gastrostomy catheter

EXAM:
ULTRASOUND ABDOMEN LIMITED
TECHNIQUE: Transverse and longitudinal images obtained in the left upper
abdomen with particular attention to the region of percutaneous
gastrostomy catheter.

[Series 1: us abdomen limited · 0.23mm/px · 19 acquisitions, 14 frames shown]
[im 1/19]
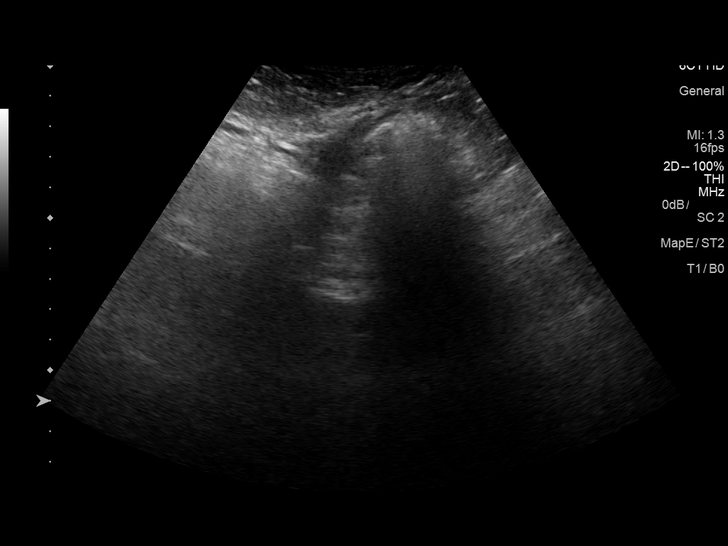
[im 3/19]
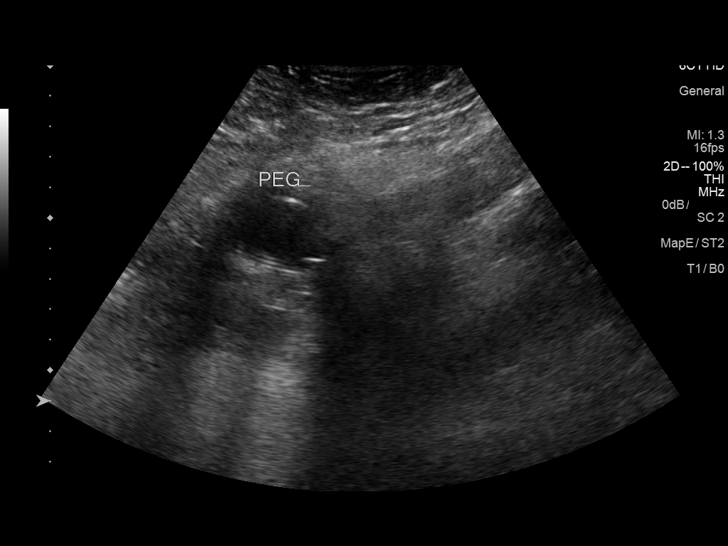
[im 4/19]
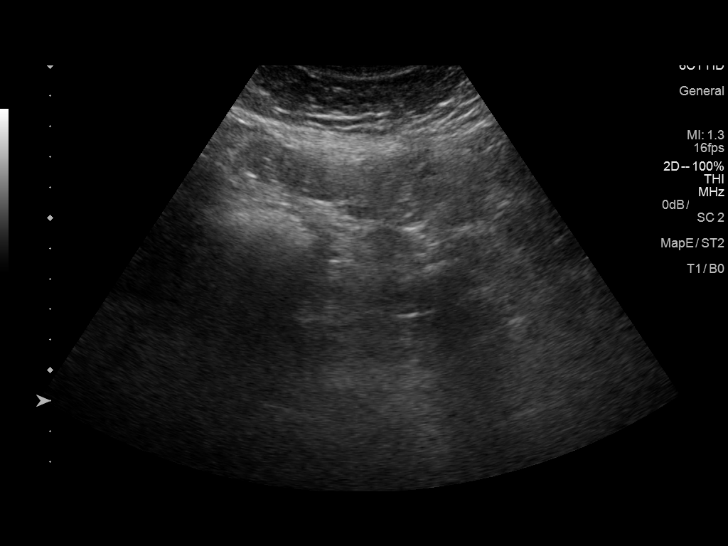
[im 5/19]
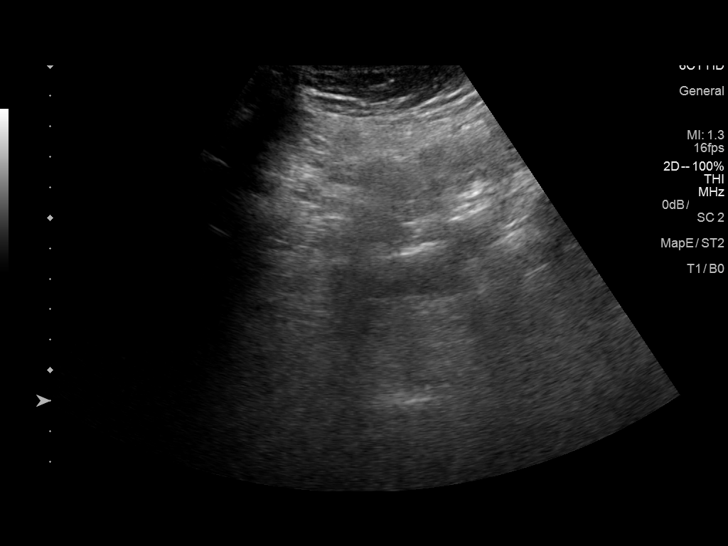
[im 7/19]
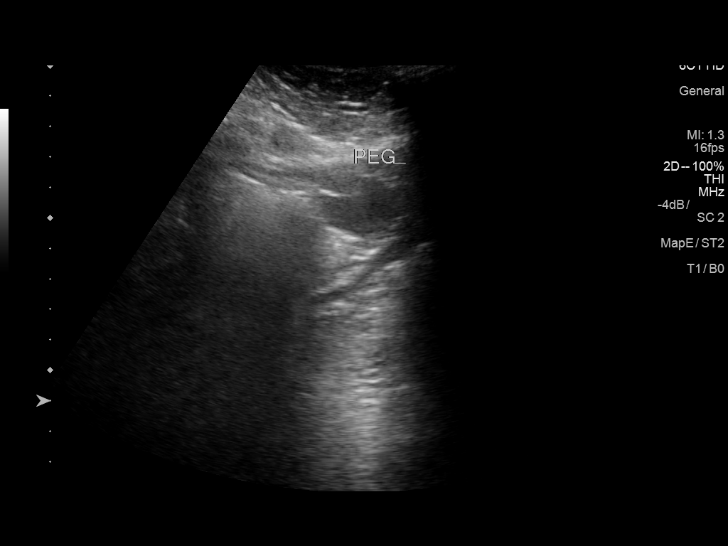
[im 8/19]
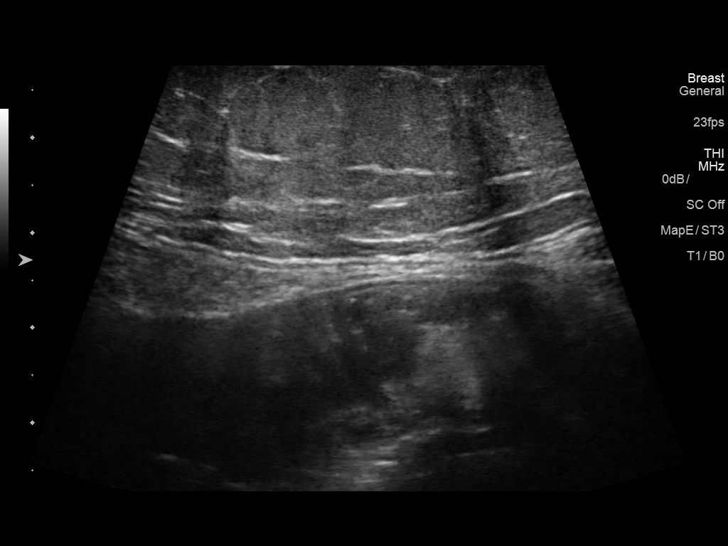
[im 9/19]
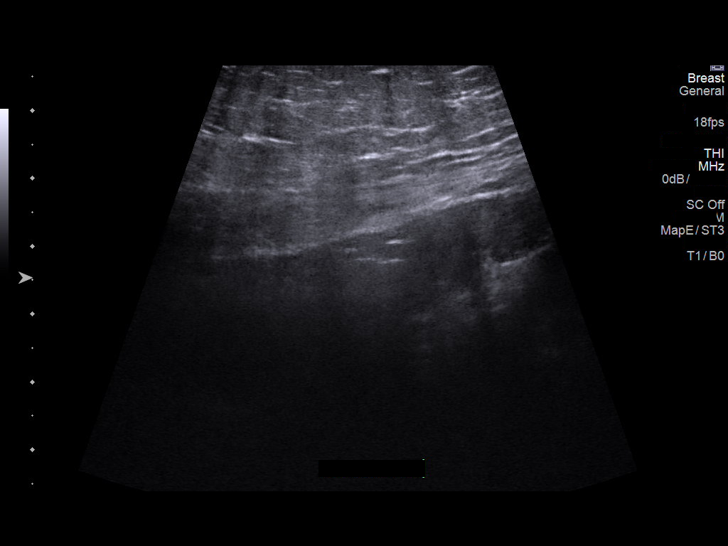
[im 11/19]
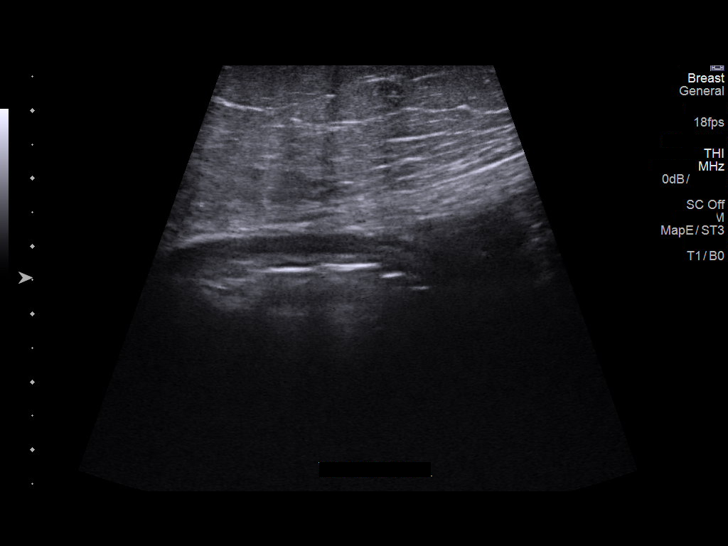
[im 12/19]
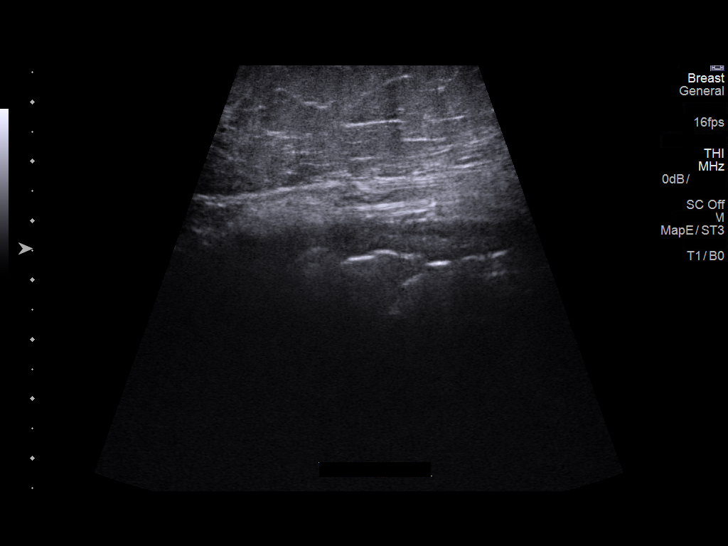
[im 13/19]
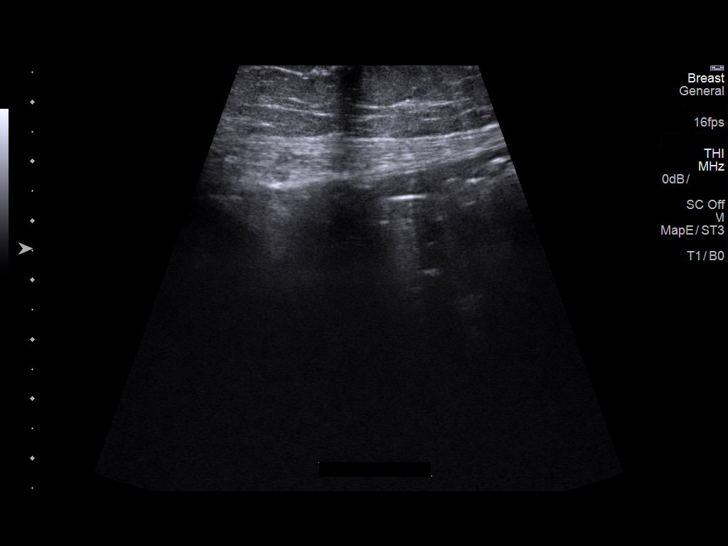
[im 15/19]
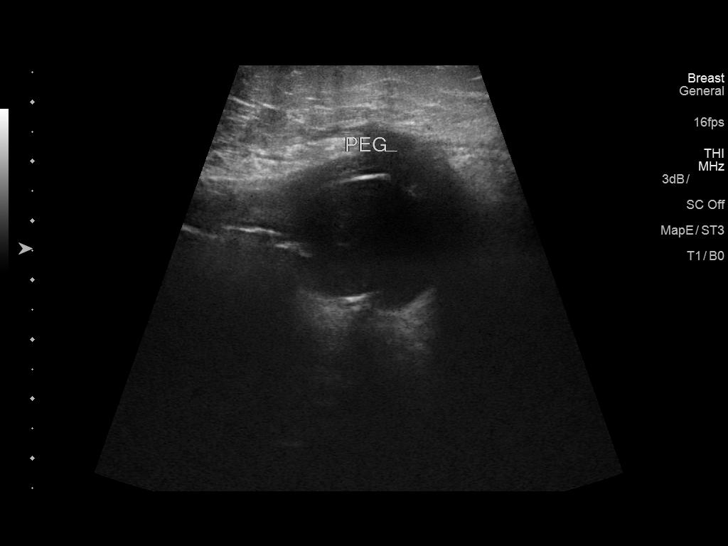
[im 16/19]
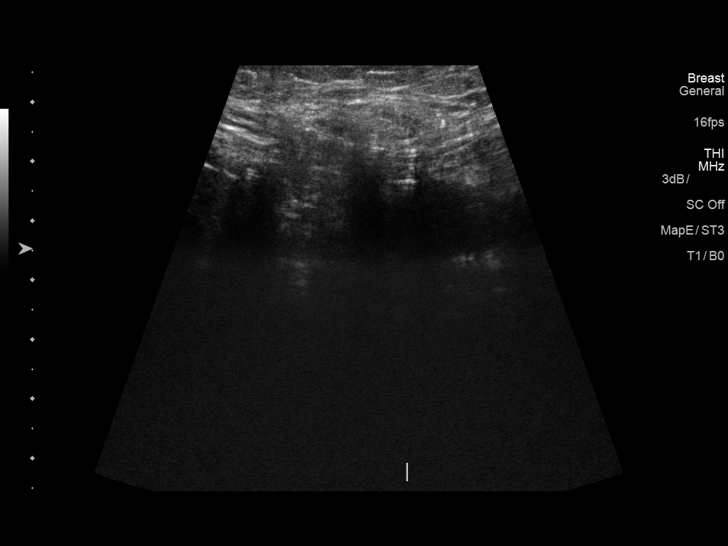
[im 17/19]
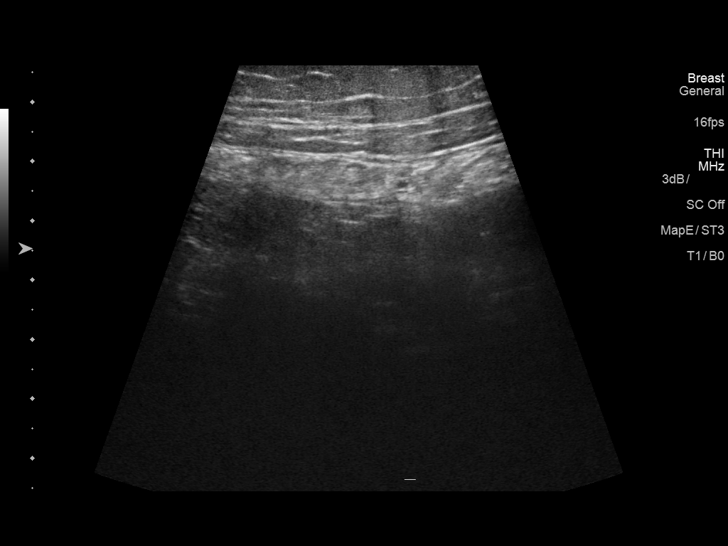
[im 19/19]
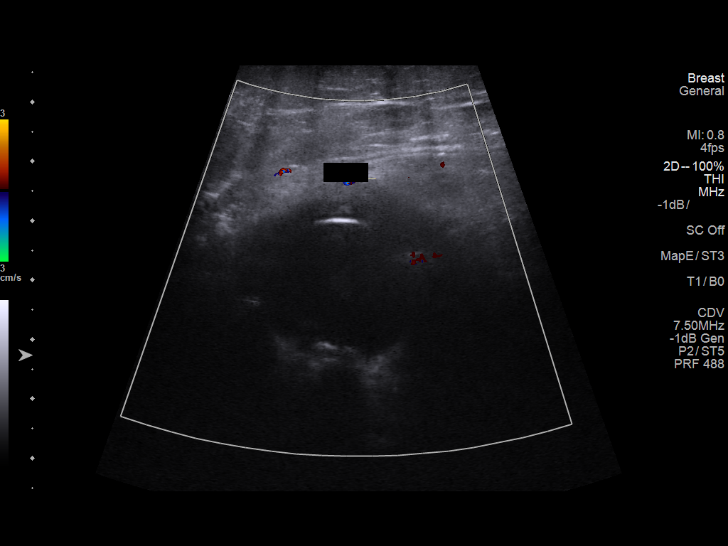

[14 of 19 positions shown; findings below may reference images not displayed]

FINDINGS: There is no mass, inflammatory appearing material, or fluid in the
region of the gastrostomy catheter. No hernia is demonstrated by
ultrasound.
IMPRESSION: No abnormality is appreciable in the region of the percutaneous
gastrostomy catheter by ultrasound.

## 2018-10-07 ENCOUNTER — Ambulatory Visit: Payer: Self-pay

## 2018-10-07 ENCOUNTER — Encounter: Payer: Medicare Other | Admitting: Nurse Practitioner

## 2018-10-07 ENCOUNTER — Telehealth: Payer: Self-pay

## 2018-10-07 NOTE — Telephone Encounter (Signed)
lmom for prescreen  

## 2018-10-09 ENCOUNTER — Ambulatory Visit: Payer: Medicare Other | Admitting: Podiatry

## 2018-10-09 ENCOUNTER — Other Ambulatory Visit: Payer: Self-pay

## 2018-10-09 DIAGNOSIS — K311 Adult hypertrophic pyloric stenosis: Secondary | ICD-10-CM | POA: Diagnosis not present

## 2018-10-09 IMAGING — CR DG CHEST 2V
2 series · 2 of 2 positions shown · non-contrast
Comparison: 04/13/2016

CLINICAL DATA: Cough and shortness of breath over the last week.

EXAM:
CHEST  2 VIEW

[chest lat]
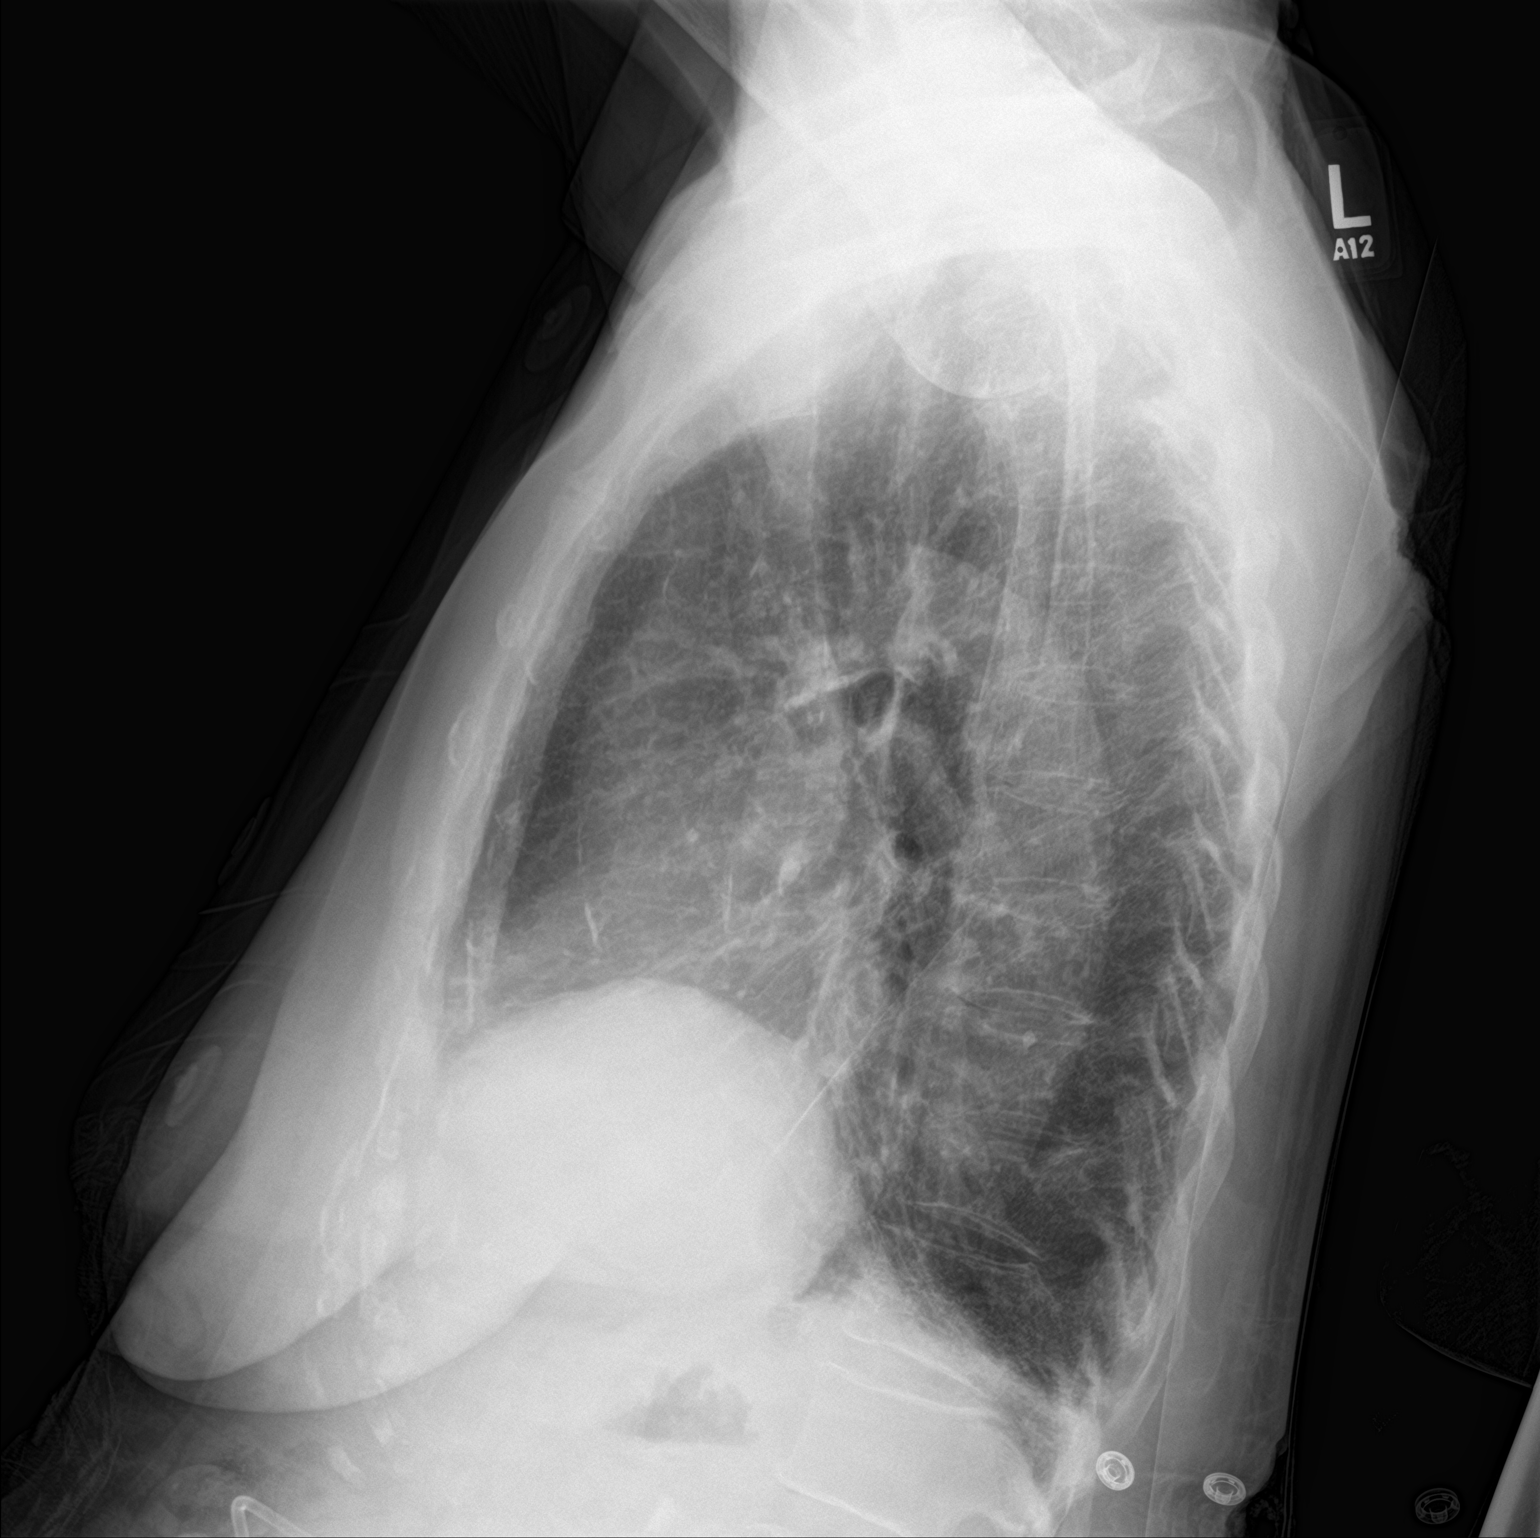

[chest ap]
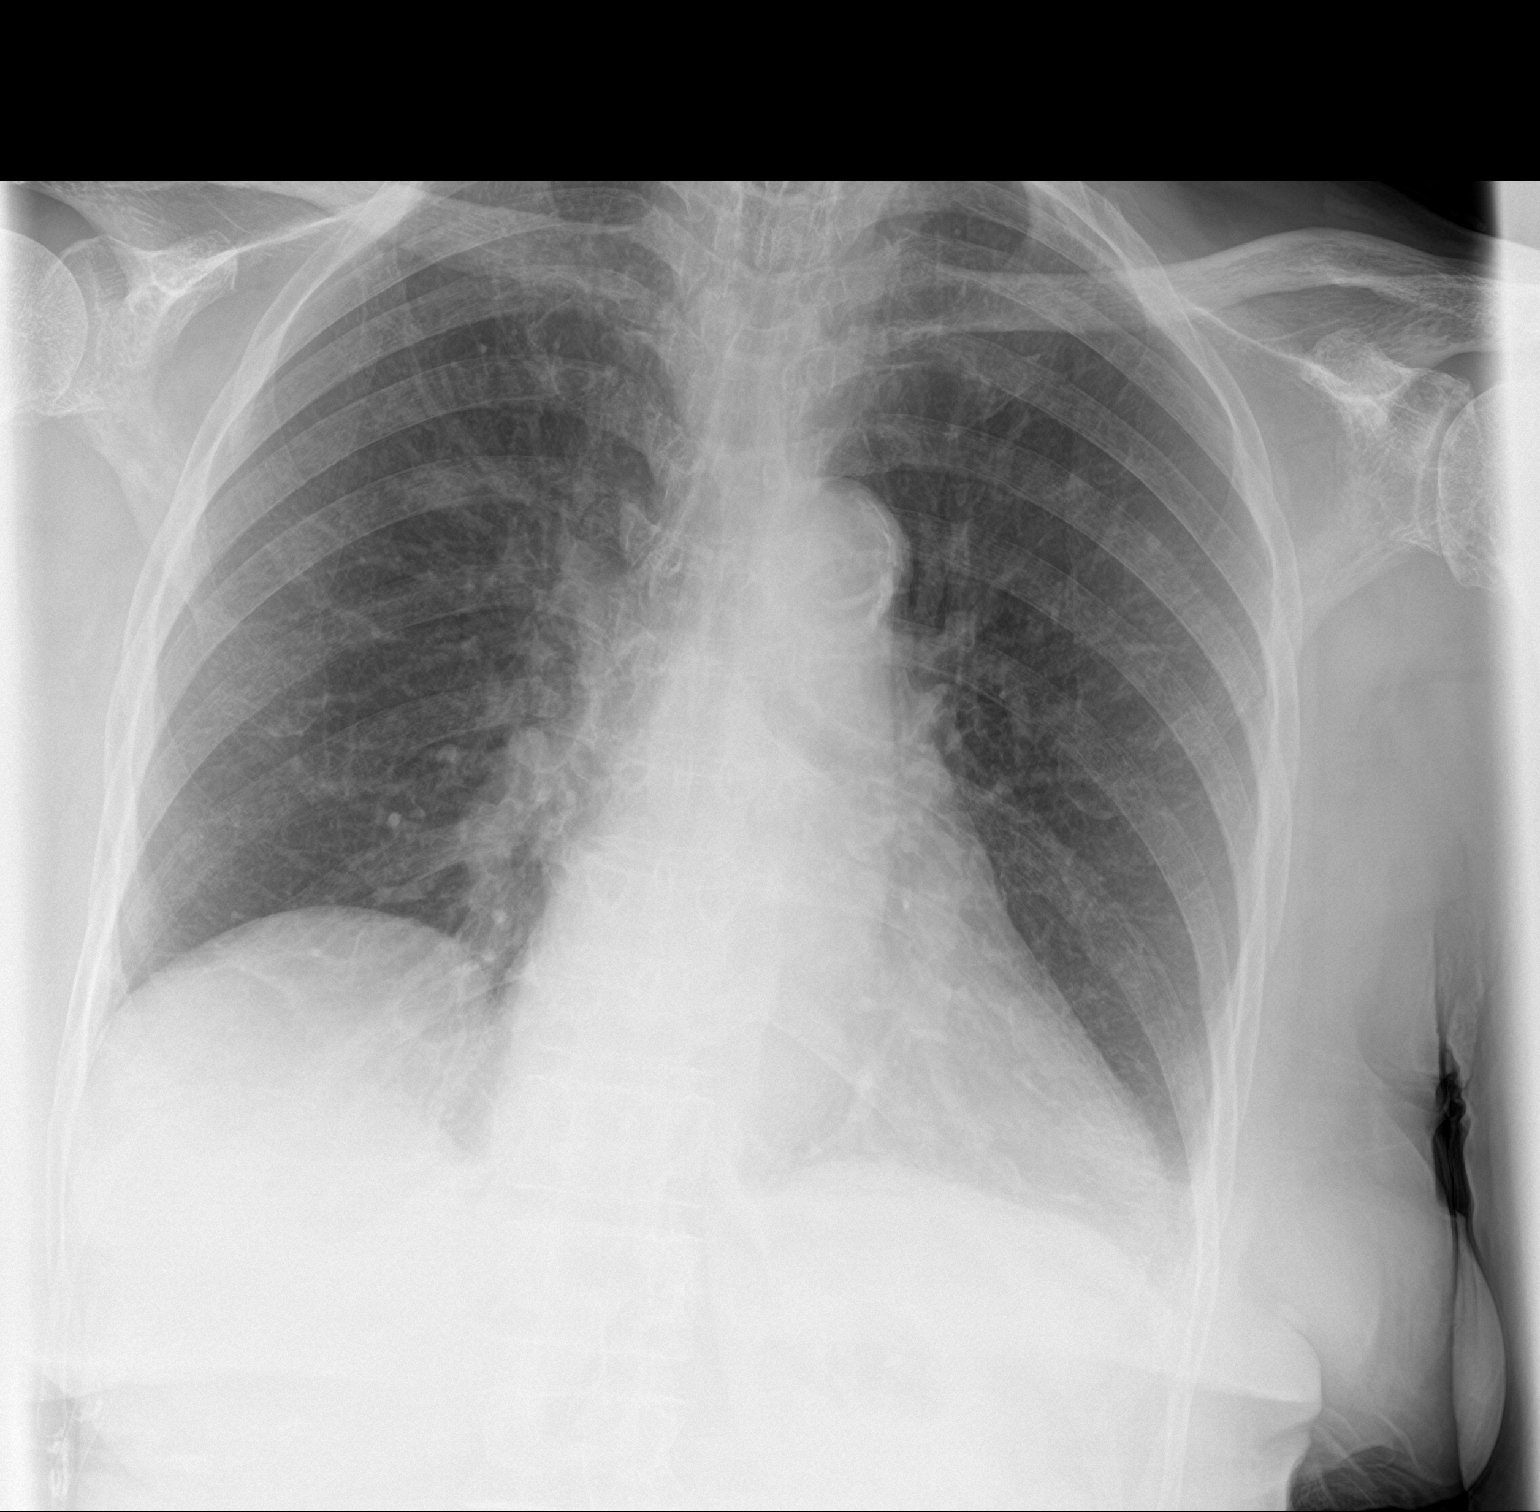

[2 of 2 positions shown; findings below may reference images not displayed]

FINDINGS: Heart size is normal. There is aortic atherosclerosis. There is
newly seen basilar volume loss, left more than right. This is
consistent with mild basilar pneumonia. Upper lungs are clear. No
effusions. No acute bone finding.
IMPRESSION: Newly seen volume loss at both lung bases most consistent with mild
atelectatic pneumonia.

Aortic atherosclerosis.

## 2018-10-10 ENCOUNTER — Ambulatory Visit (INDEPENDENT_AMBULATORY_CARE_PROVIDER_SITE_OTHER): Payer: Medicare Other | Admitting: Nurse Practitioner

## 2018-10-10 ENCOUNTER — Other Ambulatory Visit: Payer: Self-pay | Admitting: *Deleted

## 2018-10-10 ENCOUNTER — Encounter: Payer: Self-pay | Admitting: Nurse Practitioner

## 2018-10-10 DIAGNOSIS — D638 Anemia in other chronic diseases classified elsewhere: Secondary | ICD-10-CM | POA: Diagnosis not present

## 2018-10-10 DIAGNOSIS — E119 Type 2 diabetes mellitus without complications: Secondary | ICD-10-CM

## 2018-10-10 DIAGNOSIS — M150 Primary generalized (osteo)arthritis: Secondary | ICD-10-CM

## 2018-10-10 DIAGNOSIS — I739 Peripheral vascular disease, unspecified: Secondary | ICD-10-CM

## 2018-10-10 DIAGNOSIS — Z0000 Encounter for general adult medical examination without abnormal findings: Secondary | ICD-10-CM | POA: Diagnosis not present

## 2018-10-10 DIAGNOSIS — N183 Chronic kidney disease, stage 3 unspecified: Secondary | ICD-10-CM

## 2018-10-10 DIAGNOSIS — K219 Gastro-esophageal reflux disease without esophagitis: Secondary | ICD-10-CM | POA: Diagnosis not present

## 2018-10-10 DIAGNOSIS — M8949 Other hypertrophic osteoarthropathy, multiple sites: Secondary | ICD-10-CM

## 2018-10-10 DIAGNOSIS — E11621 Type 2 diabetes mellitus with foot ulcer: Secondary | ICD-10-CM

## 2018-10-10 DIAGNOSIS — K9423 Gastrostomy malfunction: Secondary | ICD-10-CM | POA: Diagnosis not present

## 2018-10-10 DIAGNOSIS — J449 Chronic obstructive pulmonary disease, unspecified: Secondary | ICD-10-CM

## 2018-10-10 DIAGNOSIS — E038 Other specified hypothyroidism: Secondary | ICD-10-CM

## 2018-10-10 DIAGNOSIS — M159 Polyosteoarthritis, unspecified: Secondary | ICD-10-CM

## 2018-10-10 DIAGNOSIS — E785 Hyperlipidemia, unspecified: Secondary | ICD-10-CM | POA: Diagnosis not present

## 2018-10-10 DIAGNOSIS — L97513 Non-pressure chronic ulcer of other part of right foot with necrosis of muscle: Secondary | ICD-10-CM

## 2018-10-10 DIAGNOSIS — I5032 Chronic diastolic (congestive) heart failure: Secondary | ICD-10-CM

## 2018-10-10 NOTE — Patient Outreach (Signed)
[name]) Care Management  10/10/2018  Shannon [name] [birth year] [id number]  Telephone Assessment-HF  RN spoke with pt's daughter Shannon [name] today and received an update on pt's ongoing management of care. Daughter indicated pt is well with no acute problems at this time. States pt is weighing daily with weights at 147 lbs with no swelling or precipitating symptoms. Plan of care discussed and daughter has verified that pt remains in the GREEN zone with no problems. RN review the plan of care and updated interventions accordingly. Also discuss pt's recent visit to the doctor this morning (diabetes) with no inquires today. Pt remains stable with no issues and continue to be adherent with her medications, HF monitoring with daily weights and sufficient transportation to all her medical appointments. Question arise concerning [name]'s involvement as RN explained this is a free benefit for prevention measures and education concerning his medical issues. Explained case management services is short term provider pt resources needed to manage her medical condition. Daughter verbalized an understanding and will inform the pt in detail for a better understanding on why [name] is involved with her care. RN also made daughter aware that pt's disposition with [facility] is communicated with pt's primary provided [name] quarterly based upon pt's participation. No other inquires or request at this time.   Update completed per care plan and RN will follow up next month prior to discussion on graduating pt from the [facility] program and services. Reminded caregiver the [facility] also has pharmacy and a Education officer, museum if additional resources are needed for ongoing management of care.  [name] CM Care Plan Problem One     Most Recent Value  Care Plan Problem One  Knowledge deficit related to HF  Role Documenting the Problem One  Care Management Coordinator  Care Plan for Problem One  Active  [name] Long Term Goal   Pt/fx will have  an increased knowledge of HF management and what to do if acute symptoms should occurr with the next 90 days.  [name] Long Term Goal Start Date  08/07/18  Interventions for Problem One Long Term Goal  Will reiterate on signs and symptoms of HF and what to do if acute symptoms are encountered. Discussed today's review with pt's caregiver (daughter Shannon [name]) and received feedback on caregiver's understanding of ongoing monitoring of pt's HF. Will verify pt remains in the GREEN zone and review this information once again. Will extend to continue ongoing adherence with pt's understanding of her medical condition (HF).       Shannon Mina, RN Care Management Coordinator [place] Office [phone]

## 2018-10-10 NOTE — Progress Notes (Signed)
Subjective:   Shannon [name] is a 85 y.o. female who presents for Medicare Annual (Subsequent) preventive examination.  Review of Systems:   Cardiac Risk Factors include: advanced age (>46men, >71 women);diabetes mellitus;dyslipidemia;hypertension;sedentary lifestyle     Objective:     Vitals: There were no vitals taken for this visit.  There is no height or weight on file to calculate BMI.  Advanced Directives 10/10/2018 09/03/2018 08/30/2018 08/07/2018 06/25/2018 05/14/2018 05/03/2018  Does Patient Have a Medical Advance Directive? Yes Yes Yes Yes Yes Yes Yes  Type of Paramedic of [place];Living will [place];Living will - [place];Living will [place];Living will [place];Living will  Does patient want to make changes to medical advance directive? No - Patient declined - - - No - Patient declined No - Patient declined No - Patient declined  Copy of Hope in Chart? Yes - validated most recent copy scanned in chart (See row information) - - - - Yes - validated most recent copy scanned in chart (See row information) Yes - validated most recent copy scanned in chart (See row information)  Would patient like information on creating a medical advance directive? - - - - - - -    Tobacco Social History   Tobacco Use  Smoking Status Never Smoker  Smokeless Tobacco Former Systems developer   Types: Snuff  Tobacco Comment   "used snuff in my younger days"     Counseling given: Not Answered Comment: "used snuff in my younger days"   Clinical Intake:  Pre-visit preparation completed: Yes  Pain : No/denies pain     BMI - recorded: 23.4 Nutritional Status: BMI of 19-24  Normal Nutritional Risks: Unintentional weight loss Diabetes: Yes CBG done?: No Did pt. bring in CBG monitor from home?: No  How often do you need to have someone help you  when you read instructions, pamphlets, or other written materials from your doctor or pharmacy?: 2 - Rarely What is the last grade level you completed in school?: 8th grade  Interpreter Needed?: No     Past Medical History:  Diagnosis Date   Acute respiratory failure ([place])    Anemia    previous blood transfusions   Arthritis    "all over"   Asthma    Atrial flutter (H[facility])    Bradycardia    requiring previous d/c of BB and reduction of amiodarone   CAD (coronary artery disease)    nonobstructive per notes   Chronic diastolic CHF (congestive heart failure) ([place])    CKD (chronic kidney disease), stage III ([place])    Complication of blood transfusion    "got the wrong blood type at [facility] in ~ 2015; no adverse reaction that we are aware of"/daughter, [name] (01/27/2016)   COPD (chronic obstructive pulmonary disease) ([place])    Depression    "light case"   DVT (deep venous thrombosis) ([place]) 01/2016   a. LLE DVT 01/2016 - switched from Eliquis to Coumadin.   Dyspnea    with some activity   Gastric stenosis    a. s/p stomach tube   GERD (gastroesophageal reflux disease)    History of blood transfusion    "several" (01/27/2016)   History of stomach ulcers    Hyperlipidemia    Hypertension    Hypothyroidism    On home oxygen therapy    "2L; 9PM til 9AM" (06/27/2017)   PAD (peripheral  artery disease) ([place])    a. prior gangrene, toe amputations, intervention.   PAF (paroxysmal atrial fibrillation) ([place])    Paraesophageal hernia    Perforated gastric ulcer ([place])    Pneumonia    "a few times" (06/27/2017)   Seasonal allergies    SIADH (syndrome of inappropriate ADH production) ([place])    [name] 01/10/2015   Small bowel obstruction ([place])    "I don't know how many" (01/11/2015)   Stroke ([place])    several- no residual   Type II diabetes mellitus ([place])    "related to prednisone use  for > 20 yr; once predinose stopped; no more DM RX" (01/27/2016)    UTI (urinary tract infection) 02/08/2016   Ventral hernia with bowel obstruction    Past Surgical History:  Procedure Laterality Date   ABDOMINAL AORTOGRAM N/A 09/21/2016   Procedure: Abdominal Aortogram;  Surgeon: Waynetta Sandy, MD;  Location: [facility];  Service: Cardiovascular;  Laterality: N/A;   AMPUTATION Left 09/25/2016   Procedure: AMPUTATION DIGIT- LEFT 2ND AND 3RD TOES;  Surgeon: Rosetta Posner, MD;  Location[facility];  Service: Vascular;  Laterality: Left;   AMPUTATION Right 05/03/2018   Procedure: AMPUTATION RIGHT GREAT TOE;  Surgeon: Rosetta Posner, MD;  [facility];  Service: Vascular;  Laterality: Right;   CATARACT EXTRACTION W/ INTRAOCULAR LENS  IMPLANT, BILATERAL     CHOLECYSTECTOMY OPEN     COLECTOMY     ESOPHAGOGASTRODUODENOSCOPY N/A 01/19/2014   Procedure: ESOPHAGOGASTRODUODENOSCOPY (EGD);  Surgeon: Irene Shipper, MD;  Location: [facility];  Service: Endoscopy;  Laterality: N/A;   ESOPHAGOGASTRODUODENOSCOPY N/A 01/20/2014   Procedure: ESOPHAGOGASTRODUODENOSCOPY (EGD);  Surgeon: Irene Shipper, MD;  Location: [facility];  Service: Endoscopy;  Laterality: N/A;   ESOPHAGOGASTRODUODENOSCOPY N/A 03/19/2014   Procedure: ESOPHAGOGASTRODUODENOSCOPY (EGD);  Surgeon: Milus Banister, MD;  Location: [facility];  Service: Endoscopy;  Laterality: N/A;   ESOPHAGOGASTRODUODENOSCOPY N/A 07/08/2015   Procedure: ESOPHAGOGASTRODUODENOSCOPY (EGD);  Surgeon: Doran Stabler, MD;  Location: [facility];  Service: Endoscopy;  Laterality: N/A;   ESOPHAGOGASTRODUODENOSCOPY (EGD) WITH PROPOFOL N/A 09/15/2015   Procedure: ESOPHAGOGASTRODUODENOSCOPY (EGD) WITH PROPOFOL;  Surgeon: Manus Gunning, MD;  [facility]: [facility];  Service: Gastroenterology;  Laterality: N/A;   GASTROJEJUNOSTOMY     hx/notes 01/10/2015   GASTROJEJUNOSTOMY N/A 09/23/2015   Procedure: OPEN GASTROJEJUNOSTOMY TUBE PLACEMENT;  Surgeon: Arta Bruce Kinsinger, MD;  Location: [facility];   Service: General;  Laterality: N/A;   GLAUCOMA SURGERY Bilateral    HERNIA REPAIR  2015   IR CM INJ ANY COLONIC TUBE W/FLUORO  01/05/2017   IR CM INJ ANY COLONIC TUBE W/FLUORO  06/07/2017   IR CM INJ ANY COLONIC TUBE W/FLUORO  11/05/2017   IR CM INJ ANY COLONIC TUBE W/FLUORO  09/18/2018   IR GENERIC HISTORICAL  01/07/2016   IR GJ TUBE CHANGE 01/07/2016 Jacqulynn Cadet, MD [facility]   IR GENERIC HISTORICAL  01/27/2016   IR MECH REMOV OBSTRUC MAT ANY COLON TUBE W/FLUORO 01/27/2016 Markus Daft, MD [facility]   IR GENERIC HISTORICAL  02/07/2016   IR PATIENT EVAL TECH 0-60 MINS Darrell K Allred, PA-C [facility]   IR GENERIC HISTORICAL  02/08/2016   IR GJ TUBE CHANGE 02/08/2016 Greggory Keen, MD [facility]   IR GENERIC HISTORICAL  01/06/2016   IR GJ TUBE CHANGE 01/06/2016 [facility] OUT REF   IR GENERIC HISTORICAL  05/02/2016   IR CM INJ ANY COLONIC TUBE W/FLUORO 05/02/2016 Arne Cleveland, MD [facility]   IR GENERIC HISTORICAL  05/15/2016   IR GJ TUBE CHANGE 05/15/2016 Sandi Mariscal, MD [facility]   IR GENERIC HISTORICAL  06/28/2016   IR GJ TUBE CHANGE 06/28/2016 [facility]   IR GJ TUBE CHANGE  02/20/2017   IR GJ TUBE CHANGE  05/10/2017   IR GJ TUBE CHANGE  07/06/2017   IR GJ TUBE CHANGE  08/02/2017   IR GJ TUBE CHANGE  10/15/2017   IR GJ TUBE CHANGE  12/26/2017   IR GJ TUBE CHANGE  04/08/2018   IR GJ TUBE CHANGE  06/19/2018   IR PATIENT EVAL TECH 0-60 MINS  10/19/2016   IR PATIENT EVAL TECH 0-60 MINS  12/25/2016   IR PATIENT EVAL TECH 0-60 MINS  01/29/2017   IR PATIENT EVAL TECH 0-60 MINS  04/04/2017   IR PATIENT EVAL TECH 0-60 MINS  04/30/2017   IR PATIENT EVAL TECH 0-60 MINS  07/31/2017   IR REPLC DUODEN/JEJUNO TUBE PERCUT W/FLUORO  11/14/2016   LAPAROTOMY N/A 01/20/2015   Procedure: EXPLORATORY LAPAROTOMY;  Surgeon: Coralie Keens, MD;  Location: [facility];  Service: General;  Laterality: N/A;   LOWER EXTREMITY ANGIOGRAPHY Left 09/21/2016   Procedure: Lower Extremity  Angiography;  Surgeon: Waynetta Sandy, MD;  Location: [facility];  Service: Cardiovascular;  Laterality: Left;   LOWER EXTREMITY ANGIOGRAPHY Right 06/27/2017   Procedure: Lower Extremity Angiography;  Surgeon: Waynetta Sandy, MD;  Location: [facility];  Service: Cardiovascular;  Laterality: Right;   LYSIS OF ADHESION N/A 01/20/2015   Procedure: LYSIS OF ADHESIONS < 1 HOUR;  Surgeon: Coralie Keens, MD;  Location: [facility];  Service: General;  Laterality: N/A;   PERIPHERAL VASCULAR BALLOON ANGIOPLASTY Left 09/21/2016   Procedure: Peripheral Vascular Balloon Angioplasty;  Surgeon: Waynetta Sandy, MD;  Location: [facility];  Service: Cardiovascular;  Laterality: Left;  drug coated balloon   PERIPHERAL VASCULAR BALLOON ANGIOPLASTY Right 06/27/2017   Procedure: PERIPHERAL VASCULAR BALLOON ANGIOPLASTY;  Surgeon: Waynetta Sandy, MD;  Location: [facility];  Service: Cardiovascular;  Laterality: Right;  SFA/TPTRUNK   TONSILLECTOMY     TUBAL LIGATION     VENTRAL HERNIA REPAIR  2015   incarcerated ventral hernia ([facility] 09/2013)/notes 01/10/2015   Family History  Problem Relation Age of Onset   Stroke Mother    Hypertension Mother    Diabetes Brother    Glaucoma Son    Glaucoma Son    Heart attack Neg Hx    Colon cancer Neg Hx    Stomach cancer Neg Hx    Social History   Socioeconomic History   Marital status: Widowed    Spouse name: Not on file   Number of children: 3   Years of education: Not on file   Highest education level: Not on file  Occupational History   Not on file  Social Needs   Financial resource strain: Not hard at all   Food insecurity    Worry: Never true    Inability: Never true   Transportation needs    Medical: No    Non-medical: No  Tobacco Use   Smoking status: Never Smoker   Smokeless tobacco: Former Systems developer    Types: Snuff   Tobacco comment: "used snuff in my younger  days"  Substance and Sexual Activity   Alcohol use: Never    Alcohol/week: 0.0 standard drinks    Frequency: Never   Drug use: Never   Sexual activity: Not Currently  Lifestyle   Physical activity    Days per week: 0 days    Minutes per session: 0 min   Stress: Not at all  Relationships   Social connections    Talks on phone: More than three times a week    Gets together: More than three times a week    Attends religious service: Never    Active member of club or organization: No    Attends meetings of clubs or organizations: Never    Relationship status: Widowed  Other Topics Concern   Not on file  Social History Narrative   Married in [place], lives in one story house with 2 people and no pets   Occupation: ?   Has a living Will, [place], and doesn't have DNR   Was living with Husband (in frail health) in [place] until 09/2013.  After her [facility] admission they both came to live with daughter in [place] Encounter Medications as of 10/10/2018  Medication Sig   acetaminophen (TYLENOL) 500 MG tablet Take 500 mg by mouth every 6 (six) hours as needed (pain).    Amino Acids-Protein Hydrolys (FEEDING SUPPLEMENT, PRO-STAT SUGAR FREE 64,) LIQD Take 30 mLs by mouth 2 (two) times daily.   atorvastatin (LIPITOR) 10 MG tablet Take 1 tablet (10 mg total) by mouth daily.   b complex vitamins tablet Take 1 tablet by mouth daily.    calcium-vitamin D (OSCAL WITH D) 500-200 MG-UNIT per tablet Take 1 tablet by mouth daily with breakfast.    Carboxymethylcellulose Sodium (ARTIFICIAL TEARS OP) Place 1 drop into both eyes 4 (four) times daily.   carvedilol (COREG) 6.25 MG tablet Take 1 tablet (6.25 mg total) by mouth 2 (two) times daily.   collagenase (SANTYL) ointment Apply 1 application topically daily as needed. For wound care   cycloSPORINE (RESTASIS) 0.05 % ophthalmic emulsion USE 1 DROP INTO BOTH EYES TWICE DAILY   feeding supplement (OSMOLITE 1 CAL) LIQD Take 711  mLs by mouth See admin instructions. For 12 hours 2100 - 0900   ferrous sulfate 325 (65 FE) MG tablet Take 1 tablet (325 mg total) by mouth daily.   fluticasone (FLONASE) 50 MCG/ACT nasal spray Spray 2 sprays into each nostril once daily   furosemide (LASIX) 40 MG tablet TAKE 1 TABLET BY MOUTH EVERY DAY   gabapentin (NEURONTIN) 300 MG capsule TAKE 1 CAPSULE BY MOUTH TWICE A DAY   gentamicin cream (GARAMYCIN) 0.1 % Apply 1 application topically 3 (three) times daily as needed.   hydrALAZINE (APRESOLINE) 50 MG tablet TAKE 1 TABLET BY MOUTH THREE TIMES A DAY   insulin aspart (NOVOLOG) 100 UNIT/ML FlexPen Inject 0-6 Units into the skin 3 (three) times daily with meals. New sliding Scale If BS is 151 - 251 = 2 units,  If BS is 251 -  350 = 4 units,  If BS is 351 - 450 = 6 units Call provider if BS runs more than 400   insulin glargine (LANTUS) 100 UNIT/ML injection Inject 8 Units into the skin at bedtime.    Insulin Pen Needle (B-D UF III MINI PEN NEEDLES) 31G X 5 MM MISC Use twice daily with giving insulin injections. Dx E11.9   ipratropium-albuterol (DUONEB) 0.5-2.5 (3) MG/3ML SOLN Take 3 mLs by nebulization every 6 (six) hours as needed (sob). Use 3 times daily x 4 days then every 6 hours as needed.   isosorbide mononitrate (IMDUR) 30 MG 24 hr tablet Take 2 tablets (60 mg  total) by mouth daily. Please make annual appt with Dr. Meda Coffee for future refills. Thank you. 1st attempt.   latanoprost (XALATAN) 0.005 % ophthalmic solution Place 1 drop into both eyes at bedtime.   levalbuterol (XOPENEX HFA) 45 MCG/ACT inhaler Inhale 2 puffs into the lungs every 6 (six) hours as needed for wheezing or shortness of breath.   levothyroxine (SYNTHROID, LEVOTHROID) 125 MCG tablet Take 1 tablet (125 mcg total) by mouth daily.   loratadine (CLARITIN) 10 MG tablet Take 10 mg by mouth daily as needed for allergies.   lubiprostone (AMITIZA) 8 MCG capsule Take 1 capsule (8 mcg total) by mouth 2 (two) times  daily with a meal.   mometasone-formoterol (DULERA) 200-5 MCG/ACT AERO TAKE TWO PUFFS BY MOUTH TWICE DAILY   Multiple Vitamin (MULTIVITAMIN WITH MINERALS) TABS tablet Take 1 tablet by mouth daily.   nystatin (MYCOSTATIN/NYSTOP) powder Apply 1 g topically as needed.   oxyCODONE (ROXICODONE) 5 MG immediate release tablet Take 1 tablet (5 mg total) by mouth every 4 (four) hours as needed for severe pain. May take 1/2 tab q4hrs as needed for mild pain.   OXYGEN Inhale 2 L into the lungs at bedtime.   pantoprazole (PROTONIX) 40 MG tablet Take 1 tablet (40 mg total) by mouth 2 (two) times daily.   sertraline (ZOLOFT) 100 MG tablet TAKE 1 TABLET BY MOUTH EVERY DAY FOR DEPRESSION   warfarin (COUMADIN) 2.5 MG tablet TAKE AS DIRECTED BY COUMADIN CLINIC.   Water For Irrigation, Sterile (FREE WATER) SOLN Place 150 mLs into feeding tube 3 (three) times daily.    White Petrolatum-Mineral Oil (SYSTANE NIGHTTIME) OINT Place 1 application into both eyes at bedtime.    No facility-administered encounter medications on file as of 10/10/2018.     Activities of Daily Living In your present state of health, do you have any difficulty performing the following activities: 10/10/2018 08/07/2018  Hearing? Tempie Donning  Vision? N N  Difficulty concentrating or making decisions? N Y  Walking or climbing stairs? Y Y  Dressing or bathing? N N  Doing errands, shopping? Tempie Donning  Preparing Food and eating ? Y N  Comment daughter helps -  Using the Toilet? N Y  In the past six months, have you accidently leaked urine? Y Y  Do you have problems with loss of bowel control? N N  Managing your Medications? Y N  Managing your Finances? N Y  Housekeeping or managing your Housekeeping? N N  Some recent data might be hidden    Patient Care Team: [name], NP as PCP - General (Geriatric Medicine) Dorothy Spark, MD as PCP - Cardiology (Cardiology) Edrick Kins, DPM as Consulting Physician (Podiatry) Dorothy Spark, MD as Consulting Physician (Cardiology) [name], NP as Nurse Practitioner (Geriatric Medicine) Camillo Flaming, [place] as Referring Physician (Optometry) Tobi Bastos, RN as [facility]    Assessment:   This is a routine wellness examination for Sherida.  Exercise Activities and Dietary recommendations Current Exercise Habits: The patient does not participate in regular exercise at present, Exercise limited by: orthopedic condition(s)  Goals     More Housework     Pt would like to do more housework and stay active.       Fall Risk Fall Risk  10/10/2018 08/27/2018 08/07/2018 05/14/2018 04/17/2018  Falls in the past year? 0 0 0 0 0  Number falls in past yr: 0 0 - 0 -  Injury with  Fall? 0 0 - 0 -  Risk for fall due to : - - - - -   Is the patient's home free of loose throw rugs in walkways, pet beds, electrical cords, etc?   yes      Grab bars in the bathroom? no      Handrails on the stairs?   No stairs       Adequate lighting?   yes  Timed Get Up and Go performed: na  Depression Screen PHQ 2/9 Scores 10/10/2018 08/07/2018 10/05/2017 02/28/2017  PHQ - 2 Score 0 0 0 0  Exception Documentation - - - -  Not completed - - - -     Cognitive Function MMSE - Mini Mental State Exam 10/05/2017 08/30/2016 08/29/2015 11/06/2014  Orientation to time 5 5 5 5   Orientation to Place 5 4 5 5   Registration 3 3 3 3   Attention/ Calculation 3 4 5 3   Recall 0 0 2 2  Language- name 2 objects 2 2 2 2   Language- repeat 1 1 1 1   Language- follow 3 step command 3 3 3 3   Language- read & follow direction 1 1 1 1   Write a sentence 1 0 0 0  Copy design 1 0 0 1  Total score 25 23 27 26      6CIT Screen 10/10/2018  What Year? 0 points  What month? 0 points  What time? 0 points  Count back from 20 0 points  Months in reverse 0 points  Repeat phrase 6 points  Total Score 6    Immunization History  Administered Date(s) Administered   Influenza, High Dose  Seasonal PF 01/19/2017   Influenza,inj,Quad PF,6+ Mos 12/24/2013, 01/27/2015, 12/29/2015, 12/04/2017   Influenza-Unspecified 12/02/2012   Pneumococcal Conjugate-13 02/03/2014   Pneumococcal Polysaccharide-23 08/31/2016   Tdap 07/18/2016   Zoster 04/12/2011    Qualifies for Shingles Vaccine?yes  Screening Tests Health Maintenance  Topic Date Due   FOOT EXAM  08/22/2017   OPHTHALMOLOGY EXAM  05/07/2018   HEMOGLOBIN A1C  10/16/2018   INFLUENZA VACCINE  11/02/2018   TETANUS/TDAP  07/19/2026   DEXA SCAN  Completed   PNA vac Low Risk Adult  Completed    Cancer Screenings: Lung: Low Dose CT Chest recommended if Age 62-80 years, 30 pack-year currently smoking OR have quit w/in 15years. Patient does not qualify. Breast:  Up to date on Mammogram? Aged out Up to date of Bone Density/Dexa? No Colorectal: aged out  Additional Screenings: Hepatitis C Screening: na     Plan:    I have personally reviewed and noted the following in the patients chart:    Medical and social history  Use of alcohol, tobacco or illicit drugs   Current medications and supplements  Functional ability and status  Nutritional status  Physical activity  Advanced directives  List of other physicians  Hospitalizations, surgeries, and ER visits in previous 12 months  Vitals  Screenings to include cognitive, depression, and falls  Referrals and appointments  In addition, I have reviewed and discussed with patient certain preventive protocols, quality metrics, and best practice recommendations. A written personalized care plan for preventive services as well as general preventive health recommendations were provided to patient.     [name], NP  10/10/2018

## 2018-10-10 NOTE — Patient Instructions (Signed)
[name] , Thank you for taking time to come for your Medicare Wellness Visit. I appreciate your ongoing commitment to your health goals. Please review the following plan we discussed and let me know if I can assist you in the future.   Screening recommendations/referrals: Colonoscopy aged out Mammogram aged out Bone Density - you have declined at this time but will let us know if you change your mind for screening for osteoporosis  Recommended yearly ophthalmology/optometry visit for glaucoma screening and checkup Recommended yearly dental visit for hygiene and checkup  Vaccinations: Influenza vaccine due 11/2018 Pneumococcal vaccine up to date Tdap vaccine up to date Shingles vaccine-- to update shingles vaccine at pharmacy    Advanced directives: on file  Conditions/risks identified: complications related to worsening vascular disease   Next appointment: 1 year   Preventive Care 83 Years and Older, Female Preventive care refers to lifestyle choices and visits with your health care provider that can promote health and wellness. What does preventive care include?  A yearly physical exam. This is also called an annual well check.  Dental exams once or twice a year.  Routine eye exams. Ask your health care provider how often you should have your eyes checked.  Personal lifestyle choices, including:  Daily care of your teeth and gums.  Regular physical activity.  Eating a healthy diet.  Avoiding tobacco and drug use.  Limiting alcohol use.  Practicing safe sex.  Taking low-dose aspirin every day.  Taking vitamin and mineral supplements as recommended by your health care provider. What happens during an annual well check? The services and screenings done by your health care provider during your annual well check will depend on your age, overall health, lifestyle risk factors, and family history of disease. Counseling  Your health care provider may ask you questions  about your:  Alcohol use.  Tobacco use.  Drug use.  Emotional well-being.  Home and relationship well-being.  Sexual activity.  Eating habits.  History of falls.  Memory and ability to understand (cognition).  Work and work Statistician.  Reproductive health. Screening  You may have the following tests or measurements:  Height, weight, and BMI.  Blood pressure.  Lipid and cholesterol levels. These may be checked every 5 years, or more frequently if you are over 16 years old.  Skin check.  Lung cancer screening. You may have this screening every year starting at age 83 if you have a 30-pack-year history of smoking and currently smoke or have quit within the past 15 years.  Fecal occult blood test (FOBT) of the stool. You may have this test every year starting at age 83.  Flexible sigmoidoscopy or colonoscopy. You may have a sigmoidoscopy every 5 years or a colonoscopy every 10 years starting at age 69.  Hepatitis C blood test.  Hepatitis B blood test.  Sexually transmitted disease (STD) testing.  Diabetes screening. This is done by checking your blood sugar (glucose) after you have not eaten for a while (fasting). You may have this done every 1-3 years.  Bone density scan. This is done to screen for osteoporosis. You may have this done starting at age 83.  Mammogram. This may be done every 1-2 years. Talk to your health care provider about how often you should have regular mammograms. Talk with your health care provider about your test results, treatment options, and if necessary, the need for more tests. Vaccines  Your health care provider may recommend certain vaccines, such as:  Influenza  vaccine. This is recommended every year.  Tetanus, diphtheria, and acellular pertussis (Tdap, Td) vaccine. You may need a Td booster every 10 years.  Zoster vaccine. You may need this after age 83.  Pneumococcal 13-valent conjugate (PCV13) vaccine. One dose is  recommended after age 80.  Pneumococcal polysaccharide (PPSV23) vaccine. One dose is recommended after age 63. Talk to your health care provider about which screenings and vaccines you need and how often you need them. This information is not intended to replace advice given to you by your health care provider. Make sure you discuss any questions you have with your health care provider. Document Released: 04/16/2015 Document Revised: 12/08/2015 Document Reviewed: 01/19/2015 Elsevier Interactive Patient Education  2017 [employer] Prevention in the Home Falls can cause injuries. They can happen to people of all ages. There are many things you can do to make your home safe and to help prevent falls. What can I do on the outside of my home?  Regularly fix the edges of walkways and driveways and fix any cracks.  Remove anything that might make you trip as you walk through a door, such as a raised step or threshold.  Trim any bushes or trees on the path to your home.  Use bright outdoor lighting.  Clear any walking paths of anything that might make someone trip, such as rocks or tools.  Regularly check to see if handrails are loose or broken. Make sure that both sides of any steps have handrails.  Any raised decks and porches should have guardrails on the edges.  Have any leaves, snow, or ice cleared regularly.  Use sand or salt on walking paths during winter.  Clean up any spills in your garage right away. This includes oil or grease spills. What can I do in the bathroom?  Use night lights.  Install grab bars by the toilet and in the tub and shower. Do not use towel bars as grab bars.  Use non-skid mats or decals in the tub or shower.  If you need to sit down in the shower, use a plastic, non-slip stool.  Keep the floor dry. Clean up any water that spills on the floor as soon as it happens.  Remove soap buildup in the tub or shower regularly.  Attach bath mats  securely with double-sided non-slip rug tape.  Do not have throw rugs and other things on the floor that can make you trip. What can I do in the bedroom?  Use night lights.  Make sure that you have a light by your bed that is easy to reach.  Do not use any sheets or blankets that are too big for your bed. They should not hang down onto the floor.  Have a firm chair that has side arms. You can use this for support while you get dressed.  Do not have throw rugs and other things on the floor that can make you trip. What can I do in the kitchen?  Clean up any spills right away.  Avoid walking on wet floors.  Keep items that you use a lot in easy-to-reach places.  If you need to reach something above you, use a strong step stool that has a grab bar.  Keep electrical cords out of the way.  Do not use floor polish or wax that makes floors slippery. If you must use wax, use non-skid floor wax.  Do not have throw rugs and other things on the floor that can  make you trip. What can I do with my stairs?  Do not leave any items on the stairs.  Make sure that there are handrails on both sides of the stairs and use them. Fix handrails that are broken or loose. Make sure that handrails are as long as the stairways.  Check any carpeting to make sure that it is firmly attached to the stairs. Fix any carpet that is loose or worn.  Avoid having throw rugs at the top or bottom of the stairs. If you do have throw rugs, attach them to the floor with carpet tape.  Make sure that you have a light switch at the top of the stairs and the bottom of the stairs. If you do not have them, ask someone to add them for you. What else can I do to help prevent falls?  Wear shoes that:  Do not have high heels.  Have rubber bottoms.  Are comfortable and fit you well.  Are closed at the toe. Do not wear sandals.  If you use a stepladder:  Make sure that it is fully opened. Do not climb a closed  stepladder.  Make sure that both sides of the stepladder are locked into place.  Ask someone to hold it for you, if possible.  Clearly mark and make sure that you can see:  Any grab bars or handrails.  First and last steps.  Where the edge of each step is.  Use tools that help you move around (mobility aids) if they are needed. These include:  Canes.  Walkers.  Scooters.  Crutches.  Turn on the lights when you go into a dark area. Replace any light bulbs as soon as they burn out.  Set up your furniture so you have a clear path. Avoid moving your furniture around.  If any of your floors are uneven, fix them.  If there are any pets around you, be aware of where they are.  Review your medicines with your doctor. Some medicines can make you feel dizzy. This can increase your chance of falling. Ask your doctor what other things that you can do to help prevent falls. This information is not intended to replace advice given to you by your health care provider. Make sure you discuss any questions you have with your health care provider. Document Released: 01/14/2009 Document Revised: 08/26/2015 Document Reviewed: 04/24/2014 Elsevier Interactive Patient Education  2017 [employer].

## 2018-10-10 NOTE — Progress Notes (Signed)
    This service is provided via telemedicine  No vital signs collected/recorded due to the encounter was a telemedicine visit.   Location of patient (ex: home, work): Home  Patient consents to a telephone visit:  Yes  Location of the provider (ex: office, home):  [facility], Office   Name of any referring provider:  N/A  Names of all persons participating in the telemedicine service and their role in the encounter: S.Chrae B/CMA, Sherrie Mustache, NP, [name] (daughter) and Patient   Time spent on call:  15 min with medical assistant

## 2018-10-10 NOTE — Progress Notes (Signed)
This service is provided via telemedicine  No vital signs collected/recorded due to the encounter was a telemedicine visit.   Location of patient (ex: home, work):  Home  Patient consents to a telephone visit:  Yes  Location of the provider (ex: office, home): [facility], Office   Name of any referring provider:  N/A  Names of all persons participating in the telemedicine service and their role in the encounter:  S.Chrae B/CMA, Sherrie Mustache, NP, [name] (daughter) and Patient   Time spent on call: 20 min with medical assistant       Careteam: Patient Care Team: [name], NP as PCP - General (Geriatric Medicine) Dorothy Spark, MD as PCP - Cardiology (Cardiology) Edrick Kins, DPM as Consulting Physician (Podiatry) Dorothy Spark, MD as Consulting Physician (Cardiology) [name], NP as Nurse Practitioner ([place]) Camillo Flaming, [place] as Referring Physician ([place]) Tobi Bastos, RN as [place] Management  Advanced Directive information    Allergies  Allergen Reactions  . Penicillins Itching, Rash and Other (See Comments)    Tolerated amoxicillin, unasyn, zosyn & cephalosporins in the past. Did it involve swelling of the face/tongue/throat, SOB, or low BP? No Did it involve sudden or severe rash/hives, skin peeling, or any reaction on the inside of your mouth or nose? No Did you need to seek medical attention at a hospital or doctor's office? Unknown When did it last happen?5+ years If all above answers are "NO", may proceed with cephalosporin use.     Chief Complaint  Patient presents with  . Medical Management of Chronic Issues    4 month follow-up. Discuss U/S results from 09/26/2018. Telephone visit       HPI: Patient is a 85 y.o. female for routine follow up.    GI recommended they see surgery- had an appt yesterday due to ongoing abdominal discomfort- [place] and xray did  not explain why she is having nausea and reflux.  She is throwing up stomach content/sputum vs her tube feedings. She is taking protonix 40 mg by mouth twice daily  GI stated that symptoms may need surgery to help but family opted against this at this time. Feels like it has to do with the tube and they are not willing to manipulate at this time   Ulcer of digit 2-4 right foot- continues to follow up with podiatrist, had debridement of tissue and currently doing wound care. Appears to be some infection per daughter, following up in a few days (was supposed to see yesterday but had to be rescheduled).   DM- controlled on current regimen, no hypoglycemia- A1c at goal in January.  Hypothyroid- continues on Levothroid 125 mcg   Constipation- controlled on tea  Chronic pain due to OA- continues on oxycodone as needed - uses occasionally for severe pain in her shoulder. Has not needed in a month  COPD- no acute flare, using dulera and has not required recue inhalers  Hyperlipidemia- continues on lipitor, LDL goal <70.  Anemia- iron supplement, no blood loss noted   Depression- in remission. continues on zoloft 100 mg by mouth daily. Reports she is doing well.   Review of Systems:  Review of Systems  Constitutional: Negative for chills, fever and weight loss.  Respiratory: Negative for cough and shortness of breath.   Cardiovascular: Negative for chest pain.  Gastrointestinal: Positive for heartburn and nausea. Negative for abdominal pain, blood in stool, constipation, diarrhea, melena and vomiting.  Genitourinary: Negative for dysuria and urgency.  Musculoskeletal: Positive for joint pain and myalgias.  Skin: Negative for itching and rash.  Neurological: Positive for sensory change. Negative for dizziness and headaches.  Psychiatric/Behavioral: Negative for depression and memory loss. The patient is not nervous/anxious and does not have insomnia.     Past Medical History:  Diagnosis Date   . Acute respiratory failure ([place])   . Anemia    previous blood transfusions  . Arthritis    "all over"  . Asthma   . Atrial flutter ([place])   . Bradycardia    requiring previous d/c of BB and reduction of amiodarone  . CAD (coronary artery disease)    nonobstructive per notes  . Chronic diastolic CHF (congestive heart failure) ([place])   . CKD (chronic kidney disease), stage III ([place])   . Complication of blood transfusion    "got the wrong blood type at [facility] in ~ 2015; no adverse reaction that we are aware of"/daughter, [name] (01/27/2016)  . COPD (chronic obstructive pulmonary disease) ([place])   . Depression    "light case"  . DVT (deep venous thrombosis) ([place]) 01/2016   a. LLE DVT 01/2016 - switched from Eliquis to Coumadin.  [name] Dyspnea    with some activity  . Gastric stenosis    a. s/p stomach tube  . GERD (gastroesophageal reflux disease)   . History of blood transfusion    "several" (01/27/2016)  . History of stomach ulcers   . Hyperlipidemia   . Hypertension   . Hypothyroidism   . On home oxygen therapy    "2L; 9PM til 9AM" (06/27/2017)  . PAD (peripheral artery disease) ([place])    a. prior gangrene, toe amputations, intervention.  [name] PAF (paroxysmal atrial fibrillation) ([place])   . Paraesophageal hernia   . Perforated gastric ulcer ([place])   . Pneumonia    "a few times" (06/27/2017)  . Seasonal allergies   . SIADH (syndrome of inappropriate ADH production) ([place])    [name] 01/10/2015  . Small bowel obstruction ([place])    "I don't know how many" (01/11/2015)  . Stroke [facility])    several- no residual  . Type II diabetes mellitus ([place])    "related to prednisone use  for > 20 yr; once predinose stopped; no more DM RX" (01/27/2016)  . UTI (urinary tract infection) 02/08/2016  . Ventral hernia with bowel obstruction    Past Surgical History:  Procedure Laterality Date  . ABDOMINAL AORTOGRAM N/A 09/21/2016   Procedure: Abdominal Aortogram;  Surgeon: Waynetta Sandy,  MD;  Location: [place] [facility];  Service: Cardiovascular;  Laterality: N/A;  . AMPUTATION Left 09/25/2016   Procedure: AMPUTATION DIGIT- LEFT 2ND AND 3RD TOES;  Surgeon: Rosetta Posner, MD;  Location: [place];  Service: Vascular;  Laterality: Left;  . AMPUTATION Right 05/03/2018   Procedure: AMPUTATION RIGHT GREAT TOE;  Surgeon: Rosetta Posner, MD;  Location: [place];  Service: Vascular;  Laterality: Right;  . CATARACT EXTRACTION W/ INTRAOCULAR LENS  IMPLANT, BILATERAL    . CHOLECYSTECTOMY OPEN    . COLECTOMY    . ESOPHAGOGASTRODUODENOSCOPY N/A 01/19/2014   Procedure: ESOPHAGOGASTRODUODENOSCOPY (EGD);  Surgeon: Irene Shipper, MD;  [facility];  Service: Endoscopy;  Laterality: N/A;  . ESOPHAGOGASTRODUODENOSCOPY N/A 01/20/2014   Procedure: ESOPHAGOGASTRODUODENOSCOPY (EGD);  Surgeon: Irene Shipper, MD;  Location[facility];  Service: Endoscopy;  Laterality: N/A;  . ESOPHAGOGASTRODUODENOSCOPY N/A 03/19/2014   Procedure: ESOPHAGOGASTRODUODENOSCOPY (EGD);  Surgeon: Milus Banister,  MD;  [facility];  Service: Endoscopy;  Laterality: N/A;  . ESOPHAGOGASTRODUODENOSCOPY N/A 07/08/2015   Procedure: ESOPHAGOGASTRODUODENOSCOPY (EGD);  Surgeon: Doran Stabler, MD;  Location: [facility];  Service: Endoscopy;  Laterality: N/A;  . ESOPHAGOGASTRODUODENOSCOPY (EGD) WITH PROPOFOL N/A 09/15/2015   Procedure: ESOPHAGOGASTRODUODENOSCOPY (EGD) WITH PROPOFOL;  Surgeon: Manus Gunning, MD;  [facility];  Service: Gastroenterology;  Laterality: N/A;  . GASTROJEJUNOSTOMY     hx/notes 01/10/2015  . GASTROJEJUNOSTOMY N/A 09/23/2015   Procedure: OPEN GASTROJEJUNOSTOMY TUBE PLACEMENT;  Surgeon: Arta Bruce Kinsinger, MD;  [facility];  Service: General;  Laterality: N/A;  . GLAUCOMA SURGERY Bilateral   . HERNIA REPAIR  2015  . IR CM INJ ANY COLONIC TUBE W/FLUORO  01/05/2017  . IR CM INJ ANY COLONIC TUBE W/FLUORO  06/07/2017  . IR CM INJ ANY COLONIC TUBE W/FLUORO  11/05/2017  . IR CM  INJ ANY COLONIC TUBE W/FLUORO  09/18/2018  . IR GENERIC HISTORICAL  01/07/2016   IR GJ TUBE CHANGE 01/07/2016 Jacqulynn Cadet, MD [facility]  . IR GENERIC HISTORICAL  01/27/2016   IR MECH REMOV OBSTRUC MAT ANY COLON TUBE W/FLUORO 01/27/2016 Markus Daft, MD [facility]  . IR GENERIC HISTORICAL  02/07/2016   IR PATIENT EVAL TECH 0-60 MINS Darrell K Allred, PA-C [facility]  . IR GENERIC HISTORICAL  02/08/2016   IR GJ TUBE CHANGE 02/08/2016 Greggory Keen, MD [facility]  . IR GENERIC HISTORICAL  01/06/2016   IR GJ TUBE CHANGE 01/06/2016 [facility] OUT REF  . IR GENERIC HISTORICAL  05/02/2016   IR CM INJ ANY COLONIC TUBE W/FLUORO 05/02/2016 Arne Cleveland, MD [facility]  . IR GENERIC HISTORICAL  05/15/2016   IR GJ TUBE CHANGE 05/15/2016 Sandi Mariscal, MD [facility]  . IR GENERIC HISTORICAL  06/28/2016   IR GJ TUBE CHANGE 06/28/2016 [facility]  . IR GJ TUBE CHANGE  02/20/2017  . IR GJ TUBE CHANGE  05/10/2017  . IR GJ TUBE CHANGE  07/06/2017  . IR GJ TUBE CHANGE  08/02/2017  . IR GJ TUBE CHANGE  10/15/2017  . IR GJ TUBE CHANGE  12/26/2017  . IR [place] TUBE CHANGE  04/08/2018  . IR [place] TUBE CHANGE  06/19/2018  . IR PATIENT EVAL TECH 0-60 MINS  10/19/2016  . IR PATIENT EVAL TECH 0-60 MINS  12/25/2016  . IR PATIENT EVAL TECH 0-60 MINS  01/29/2017  . IR PATIENT EVAL TECH 0-60 MINS  04/04/2017  . IR PATIENT EVAL TECH 0-60 MINS  04/30/2017  . IR PATIENT EVAL TECH 0-60 MINS  07/31/2017  . IR REPLC DUODEN/JEJUNO TUBE PERCUT W/FLUORO  11/14/2016  . LAPAROTOMY N/A 01/20/2015   Procedure: EXPLORATORY LAPAROTOMY;  Surgeon: Coralie Keens, MD;  Location: [place];  Service: General;  Laterality: N/A;  . LOWER EXTREMITY ANGIOGRAPHY Left 09/21/2016   Procedure: Lower Extremity Angiography;  Surgeon: Waynetta Sandy, MD;  Location: [place] [facility];  Service: Cardiovascular;  Laterality: Left;  . LOWER EXTREMITY ANGIOGRAPHY Right 06/27/2017   Procedure: Lower Extremity Angiography;  Surgeon: Waynetta Sandy,  MD;  Location: [place] [facility];  Service: Cardiovascular;  Laterality: Right;  . LYSIS OF ADHESION N/A 01/20/2015   Procedure: LYSIS OF ADHESIONS < 1 HOUR;  Surgeon: Coralie Keens, MD;  Location: [place];  Service: General;  Laterality: N/A;  . PERIPHERAL VASCULAR BALLOON ANGIOPLASTY Left 09/21/2016   Procedure: Peripheral Vascular Balloon Angioplasty;  Surgeon: Waynetta Sandy, MD;  Location: [place] [facility];  Service: Cardiovascular;  Laterality: Left;  drug coated balloon  . PERIPHERAL VASCULAR BALLOON ANGIOPLASTY Right 06/27/2017   Procedure: PERIPHERAL VASCULAR BALLOON ANGIOPLASTY;  Surgeon: Waynetta Sandy, MD;  Location: [place] [facility];  Service: Cardiovascular;  Laterality: Right;  SFA/TPTRUNK  . TONSILLECTOMY    . TUBAL LIGATION    . VENTRAL HERNIA REPAIR  2015   incarcerated ventral hernia ([facility] 09/2013)/notes 01/10/2015   Social History:   reports that she has never smoked. She quit smokeless tobacco use about 38 years ago.  Her smokeless tobacco use included snuff. She reports that she does not drink alcohol or use drugs.  Family History  Problem Relation Age of Onset  . Stroke Mother   . Hypertension Mother   . Diabetes Brother   . Glaucoma Son   . Glaucoma Son   . Heart attack Neg Hx   . Colon cancer Neg Hx   . Stomach cancer Neg Hx     Medications: Patient's Medications  New Prescriptions   No medications on file  Previous Medications   ACETAMINOPHEN (TYLENOL) 500 MG TABLET    Take 500 mg by mouth every 6 (six) hours as needed (pain).    AMINO ACIDS-PROTEIN HYDROLYS (FEEDING SUPPLEMENT, PRO-STAT SUGAR FREE 64,) LIQD    Take 30 mLs by mouth 2 (two) times daily.   ATORVASTATIN (LIPITOR) 10 MG TABLET    Take 1 tablet (10 mg total) by mouth daily.   B COMPLEX VITAMINS TABLET    Take 1 tablet by mouth daily.    CALCIUM-VITAMIN D (OSCAL WITH D) 500-200 MG-UNIT PER TABLET    Take 1 tablet by mouth daily with breakfast.    CARBOXYMETHYLCELLULOSE  SODIUM (ARTIFICIAL TEARS OP)    Place 1 drop into both eyes 4 (four) times daily.   CARVEDILOL (COREG) 6.25 MG TABLET    Take 1 tablet (6.25 mg total) by mouth 2 (two) times daily.   COLLAGENASE (SANTYL) OINTMENT    Apply 1 application topically daily as needed. For wound care   CYCLOSPORINE (RESTASIS) 0.05 % OPHTHALMIC EMULSION    USE 1 DROP INTO BOTH EYES TWICE DAILY   FEEDING SUPPLEMENT (OSMOLITE 1 CAL) LIQD    Take 711 mLs by mouth See admin instructions. For 12 hours 2100 - 0900   FERROUS SULFATE 325 (65 FE) MG TABLET    Take 1 tablet (325 mg total) by mouth daily.   FLUTICASONE (FLONASE) 50 MCG/ACT NASAL SPRAY    Spray 2 sprays into each nostril once daily   FUROSEMIDE (LASIX) 40 MG TABLET    TAKE 1 TABLET BY MOUTH EVERY DAY   GABAPENTIN (NEURONTIN) 300 MG CAPSULE    TAKE 1 CAPSULE BY MOUTH TWICE A DAY   GENTAMICIN CREAM (GARAMYCIN) 0.1 %    Apply 1 application topically 3 (three) times daily as needed.   HYDRALAZINE (APRESOLINE) 50 MG TABLET    TAKE 1 TABLET BY MOUTH THREE TIMES A DAY   INSULIN ASPART (NOVOLOG) 100 UNIT/ML FLEXPEN    Inject 0-6 Units into the skin 3 (three) times daily with meals. New sliding Scale If BS is 151 - 251 = 2 units,  If BS is 251 -  350 = 4 units,  If BS is 351 - 450 = 6 units Call provider if BS runs more than 400   INSULIN GLARGINE (LANTUS) 100 UNIT/ML INJECTION    Inject 8 Units into the skin at bedtime.    INSULIN PEN NEEDLE (B-D UF III MINI PEN  NEEDLES) 31G X 5 MM MISC    Use twice daily with giving insulin injections. Dx E11.9   IPRATROPIUM-ALBUTEROL (DUONEB) 0.5-2.5 (3) MG/3ML SOLN    Take 3 mLs by nebulization every 6 (six) hours as needed (sob). Use 3 times daily x 4 days then every 6 hours as needed.   ISOSORBIDE MONONITRATE (IMDUR) 30 MG 24 HR TABLET    Take 2 tablets (60 mg total) by mouth daily. Please make annual appt with Dr. Meda Coffee for future refills. Thank you. 1st attempt.   LATANOPROST (XALATAN) 0.005 % OPHTHALMIC SOLUTION    Place 1 drop into  both eyes at bedtime.   LEVALBUTEROL (XOPENEX HFA) 45 MCG/ACT INHALER    Inhale 2 puffs into the lungs every 6 (six) hours as needed for wheezing or shortness of breath.   LEVOTHYROXINE (SYNTHROID, LEVOTHROID) 125 MCG TABLET    Take 1 tablet (125 mcg total) by mouth daily.   LORATADINE (CLARITIN) 10 MG TABLET    Take 10 mg by mouth daily as needed for allergies.   LUBIPROSTONE (AMITIZA) 8 MCG CAPSULE    Take 1 capsule (8 mcg total) by mouth 2 (two) times daily with a meal.   MOMETASONE-FORMOTEROL (DULERA) 200-5 MCG/ACT AERO    TAKE TWO PUFFS BY MOUTH TWICE DAILY   MULTIPLE VITAMIN (MULTIVITAMIN WITH MINERALS) TABS TABLET    Take 1 tablet by mouth daily.   NYSTATIN (MYCOSTATIN/NYSTOP) POWDER    Apply 1 g topically as needed.   OXYCODONE (ROXICODONE) 5 MG IMMEDIATE RELEASE TABLET    Take 1 tablet (5 mg total) by mouth every 4 (four) hours as needed for severe pain. May take 1/2 tab q4hrs as needed for mild pain.   OXYGEN    Inhale 2 L into the lungs at bedtime.   PANTOPRAZOLE (PROTONIX) 40 MG TABLET    Take 1 tablet (40 mg total) by mouth 2 (two) times daily.   SERTRALINE (ZOLOFT) 100 MG TABLET    TAKE 1 TABLET BY MOUTH EVERY DAY FOR DEPRESSION   WARFARIN (COUMADIN) 2.5 MG TABLET    TAKE AS DIRECTED BY COUMADIN CLINIC.   WATER FOR IRRIGATION, STERILE (FREE WATER) SOLN    Place 150 mLs into feeding tube 3 (three) times daily.    WHITE PETROLATUM-MINERAL OIL (SYSTANE NIGHTTIME) OINT    Place 1 application into both eyes at bedtime.   Modified Medications   No medications on file  Discontinued Medications   No medications on file    Physical Exam:  There were no vitals filed for this visit. There is no height or weight on file to calculate BMI. Wt Readings from Last 3 Encounters:  09/03/18 145 lb (65.8 kg)  08/27/18 145 lb (65.8 kg)  08/16/18 145 lb 12.8 oz (66.1 kg)   Labs reviewed: Basic Metabolic Panel: Recent Labs    04/17/18 1526  05/09/18 0311 05/14/18 1235 08/27/18 1100  NA  141   < > 136 141 137  K 4.4   < > 3.9 4.5 4.8  CL 101   < > 102 105 100  CO2 34*   < > 22 28 31   GLUCOSE 86   < > 208* 35* 106  BUN 68*   < > 40* 67* 71*  CREATININE 1.49*   < > 1.28* 1.42* 1.43*  CALCIUM 9.9   < > 9.0 9.5 9.8  TSH 0.15*  --   --   --   --    < > = values in this  interval not displayed.   Liver Function Tests: Recent Labs    05/03/18 1055 05/14/18 1235 08/27/18 1100  AST 42* 24 22  ALT 34 22 14  ALKPHOS 59  --   --   BILITOT 1.0 0.3 0.3  PROT 7.6 7.2 7.7  ALBUMIN 3.1*  --   --    Recent Labs    08/27/18 1100  LIPASE 24  AMYLASE 60   No results for input(s): AMMONIA in the last 8760 hours. CBC: Recent Labs    04/17/18 1526  05/09/18 0311 05/14/18 1235 08/27/18 1100  WBC 5.7   < > 8.0 6.2 5.1  NEUTROABS 3,431  --   --  3,590 3,019  HGB 10.3*   < > 8.5* 8.8* 9.7*  HCT 31.5*   < > 25.1* 27.0* 30.0*  MCV 93.8   < > 90.3 94.7 93.5  PLT 207   < > 288 366 191   < > = values in this interval not displayed.   Lipid Panel: No results for input(s): CHOL, HDL, LDLCALC, TRIG, CHOLHDL, LDLDIRECT in the last 8760 hours. TSH: Recent Labs    04/17/18 1526  TSH 0.15*   A1C: Lab Results  Component Value Date   HGBA1C 6.6 (H) 04/17/2018     Assessment/Plan 1. Diabetes mellitus type 2 in nonobese [facility]) -continues on novolog and lantus, no hypoglycemia, will follow up lab work - Hemoglobin A1c; Future - COMPLETE METABOLIC PANEL WITH GFR; Future  2. PAD (peripheral artery disease) (HCC) Ongoing issues with diabetic ulcers to toes following with podiatry, follow with Dr Early for vascular. S/p  right SFA angioplasty and tibioperoneal trunk angioplasty with Dr.Cain. continues on coumadin.  3. Chronic kidney insufficiency, stage 3 (moderate) (HCC) Encourage proper hydration and to avoid NSAIDS (Aleve, Advil, Motrin, Ibuprofen)  - COMPLETE METABOLIC PANEL WITH GFR; Future  4. COPD with asthma ([place]) Overall breathing has been stable without recent  exacerbation. Continue dulera twice daily regimen  5. Chronic diastolic CHF (congestive heart failure) (HCC) Stable on coreg, lasix and imdur - COMPLETE METABOLIC PANEL WITH GFR; Future  6. Anemia of chronic disease Continues on iron supplement, will follow up cbc - CBC with Differential/Platelet; Future  7. Primary osteoarthritis involving multiple joints Ongoing, stable on tylenol and will use oxy IR for severe pain  8. Diabetic ulcer of toe of right foot associated with type 2 diabetes mellitus, with necrosis of muscle (HCC) Continues to have issues with nonhealing ulcers. Followed by vascular and podiatrist.  9. Hyperlipidemia LDL goal <70 Continues on Lipitor  - COMPLETE METABOLIC PANEL WITH GFR; Future - Lipid panel; Future  10. Other specified hypothyroidism - TSH; Future  11. PEG tube malfunction ([place]) Doing well at this time. Has followed up with GI and surgery, currently tube is functioning however with symptoms of nausea and reflux. She has decided not to investigate further or look into surgery for intervention.   12. Gastroesophageal reflux disease, esophagitis presence not specified Ongoing symptoms of reflux and nausea, continues on protonix twice daily   Next appt: 10/15/2018 Janett Billow K. Harle Battiest  [facility] [phone]    Virtual Visit via Telephone Note  I connected with@ on 10/11/18 at  2:45 PM EDT by telephone and verified that I am speaking with the correct person using two identifiers.  Location: Patient: home Provider: office   I discussed the limitations, risks, security and privacy concerns of performing an evaluation and management service by telephone and the  availability of in person appointments. I also discussed with the patient that there may be a patient responsible charge related to this service. The patient expressed understanding and agreed to proceed.   I discussed the assessment and treatment plan with  the patient. The patient was provided an opportunity to ask questions and all were answered. The patient agreed with the plan and demonstrated an understanding of the instructions.   The patient was advised to call back or seek an in-person evaluation if the symptoms worsen or if the condition fails to improve as anticipated.  I provided 25 minutes of non-face-to-face time during this encounter.  [name]. Harle Battiest Avs printed and mailed

## 2018-10-11 ENCOUNTER — Ambulatory Visit (INDEPENDENT_AMBULATORY_CARE_PROVIDER_SITE_OTHER): Payer: Medicare Other | Admitting: Pharmacist

## 2018-10-11 ENCOUNTER — Other Ambulatory Visit: Payer: Self-pay

## 2018-10-11 DIAGNOSIS — I4891 Unspecified atrial fibrillation: Secondary | ICD-10-CM

## 2018-10-11 DIAGNOSIS — Z5181 Encounter for therapeutic drug level monitoring: Secondary | ICD-10-CM

## 2018-10-11 DIAGNOSIS — I4892 Unspecified atrial flutter: Secondary | ICD-10-CM | POA: Diagnosis not present

## 2018-10-11 LAB — POCT INR: INR: 2.1 (ref 2.0–3.0)

## 2018-10-11 NOTE — Patient Instructions (Signed)
Description   Continue taking 2 tablets daily except 3 tablets on Sundays. Recheck in 3 weeks.  Call [facility]madin Clinic with any new medications [phone].

## 2018-10-12 ENCOUNTER — Other Ambulatory Visit: Payer: Self-pay | Admitting: Nurse Practitioner

## 2018-10-12 IMAGING — DX DG CHEST 2V
2 series · 2 of 2 positions shown · non-contrast
Comparison: Chest x-ray of May 08, 2016

CLINICAL DATA: Dyspnea, community-acquired pneumonia, CHF, chronic
renal insufficiency.

EXAM:
CHEST  2 VIEW

[x chest ap]
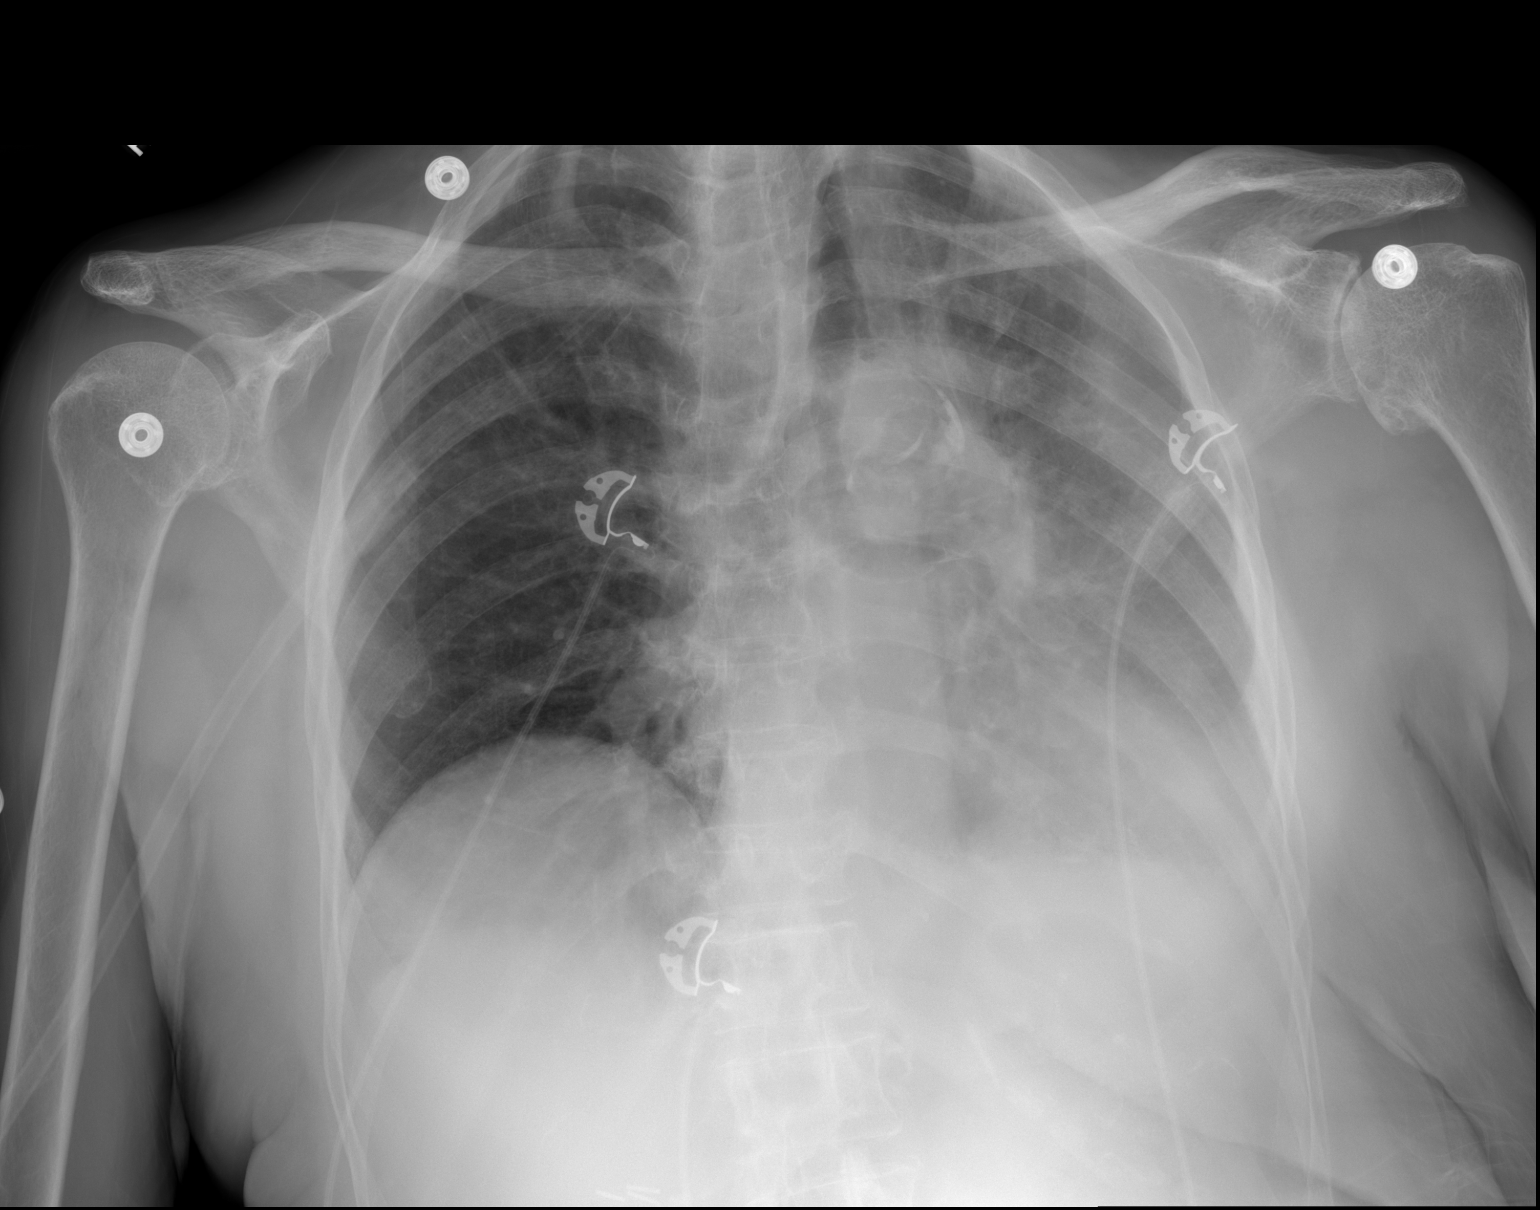

[w chest lat]
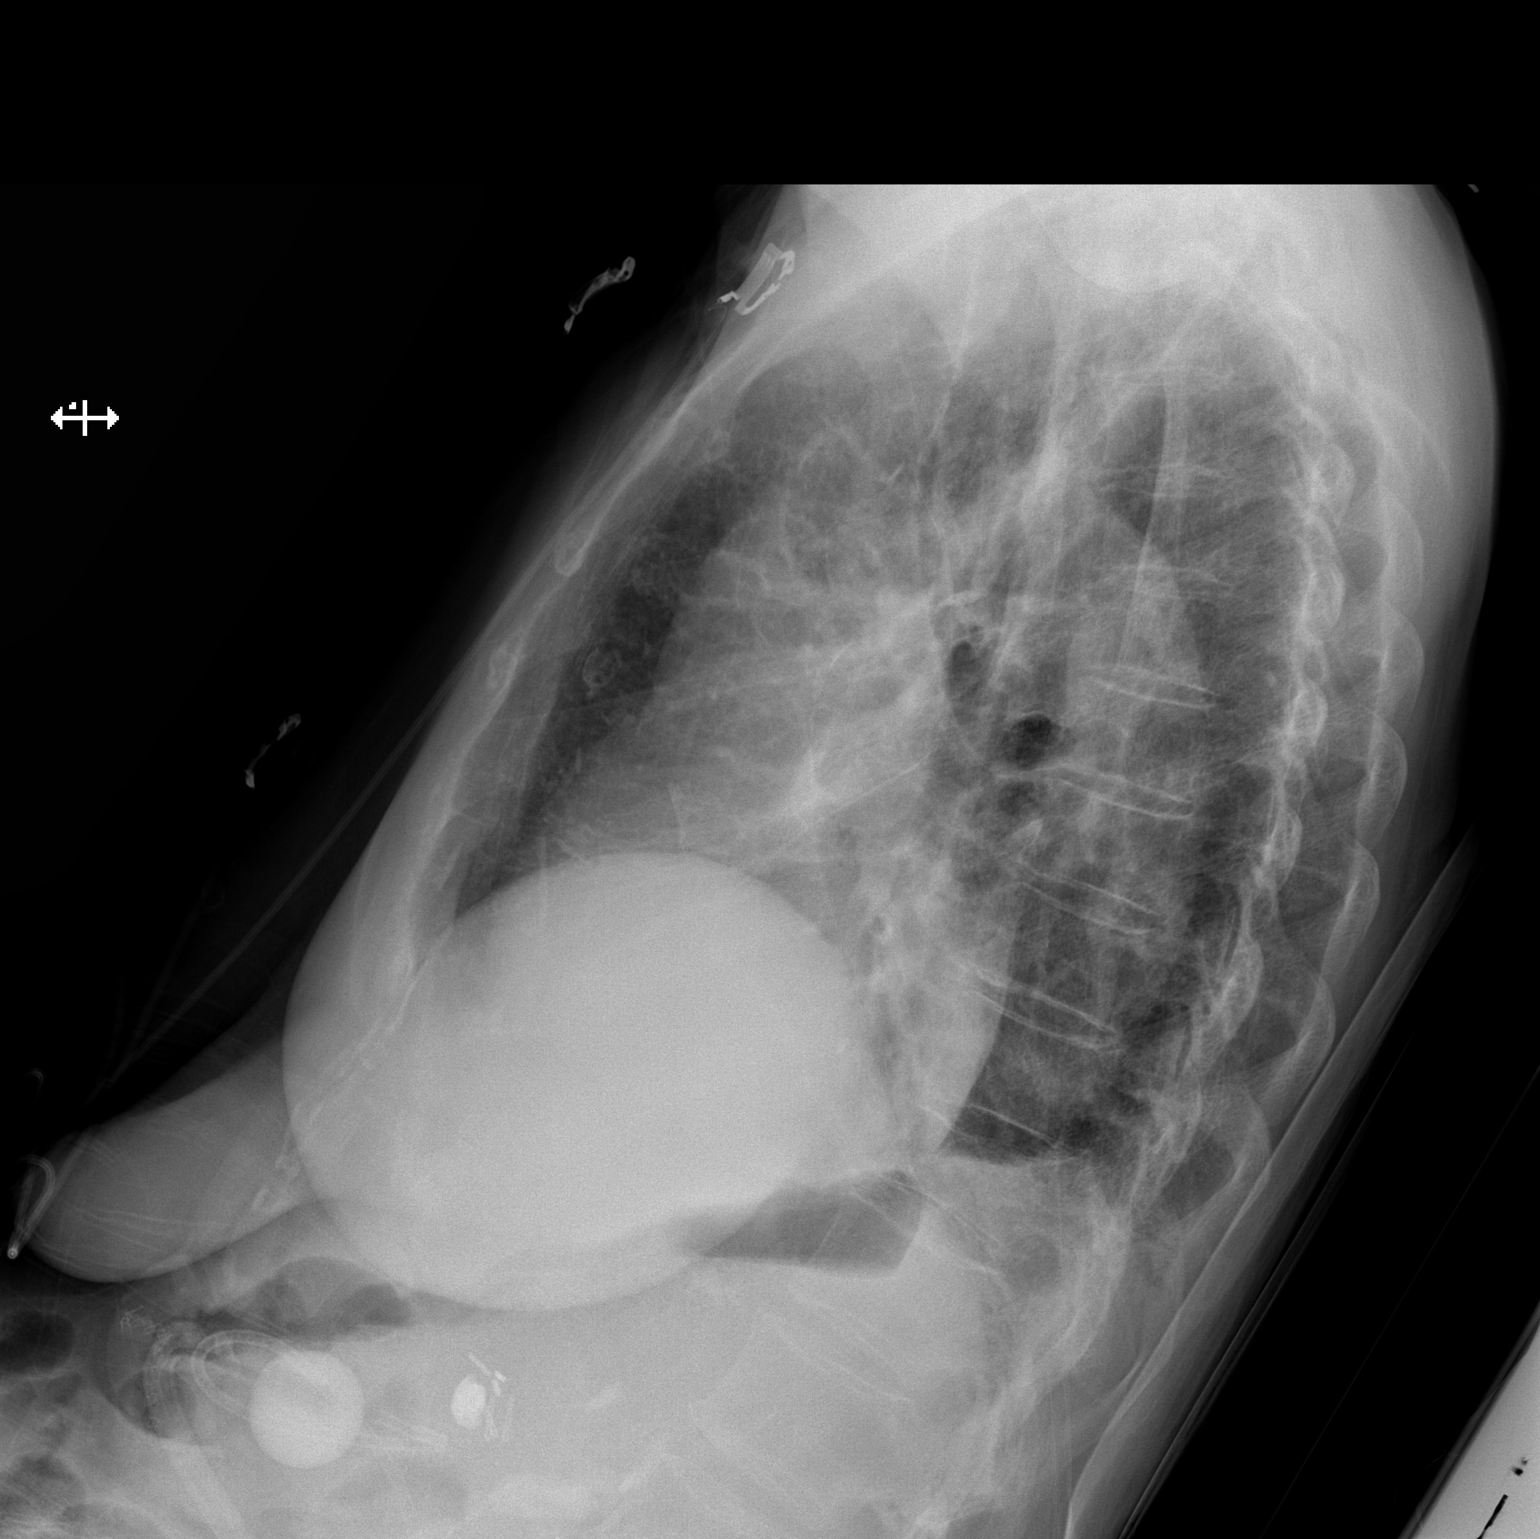

[2 of 2 positions shown; findings below may reference images not displayed]

FINDINGS: Since a previous study there has developed confluent alveolar
opacity in the left mid and lower lung. The left hemidiaphragm is
nearly obscured. The right lung is adequately inflated and clear.
The pulmonary vascularity is not clearly engorged. The left heart
border is now obscured. There is calcification in the wall of the
aortic arch. There is chronic partial compression of the body of
T11.
IMPRESSION: Interval development of alveolar opacity in the left lung likely in
the lingula which is not clearly visible on the lateral view this
may reflect pneumonia or atelectasis. There is a new small left
pleural effusion.

No CHF.

Thoracic aortic atherosclerosis.

## 2018-10-14 ENCOUNTER — Ambulatory Visit (INDEPENDENT_AMBULATORY_CARE_PROVIDER_SITE_OTHER): Payer: Medicare Other

## 2018-10-14 ENCOUNTER — Other Ambulatory Visit: Payer: Medicare Other

## 2018-10-14 ENCOUNTER — Encounter: Payer: Self-pay | Admitting: Podiatry

## 2018-10-14 ENCOUNTER — Ambulatory Visit (INDEPENDENT_AMBULATORY_CARE_PROVIDER_SITE_OTHER): Payer: Medicare Other | Admitting: Podiatry

## 2018-10-14 ENCOUNTER — Other Ambulatory Visit: Payer: Self-pay

## 2018-10-14 VITALS — Temp 98.7°F

## 2018-10-14 DIAGNOSIS — L97512 Non-pressure chronic ulcer of other part of right foot with fat layer exposed: Secondary | ICD-10-CM

## 2018-10-14 DIAGNOSIS — I70235 Atherosclerosis of native arteries of right leg with ulceration of other part of foot: Secondary | ICD-10-CM

## 2018-10-14 DIAGNOSIS — I739 Peripheral vascular disease, unspecified: Secondary | ICD-10-CM

## 2018-10-14 DIAGNOSIS — E0859 Diabetes mellitus due to underlying condition with other circulatory complications: Secondary | ICD-10-CM

## 2018-10-15 ENCOUNTER — Other Ambulatory Visit: Payer: Medicare Other

## 2018-10-15 DIAGNOSIS — D638 Anemia in other chronic diseases classified elsewhere: Secondary | ICD-10-CM | POA: Diagnosis not present

## 2018-10-15 DIAGNOSIS — E119 Type 2 diabetes mellitus without complications: Secondary | ICD-10-CM | POA: Diagnosis not present

## 2018-10-15 DIAGNOSIS — N183 Chronic kidney disease, stage 3 unspecified: Secondary | ICD-10-CM

## 2018-10-15 DIAGNOSIS — E785 Hyperlipidemia, unspecified: Secondary | ICD-10-CM

## 2018-10-15 DIAGNOSIS — E038 Other specified hypothyroidism: Secondary | ICD-10-CM

## 2018-10-15 DIAGNOSIS — I5032 Chronic diastolic (congestive) heart failure: Secondary | ICD-10-CM | POA: Diagnosis not present

## 2018-10-16 LAB — CBC WITH DIFFERENTIAL/PLATELET
Absolute Monocytes: 639 cells/uL (ref 200–950)
Basophils Absolute: 42 cells/uL (ref 0–200)
Basophils Relative: 0.9 %
Eosinophils Absolute: 310 cells/uL (ref 15–500)
Eosinophils Relative: 6.6 %
HCT: 31.5 % — ABNORMAL LOW (ref 35.0–45.0)
Hemoglobin: 10.3 g/dL — ABNORMAL LOW (ref 11.7–15.5)
Lymphs Abs: 912 cells/uL (ref 850–3900)
MCH: 30.7 pg (ref 27.0–33.0)
MCHC: 32.7 g/dL (ref 32.0–36.0)
MCV: 93.8 fL (ref 80.0–100.0)
MPV: 12.2 fL (ref 7.5–12.5)
Monocytes Relative: 13.6 %
Neutro Abs: 2797 cells/uL (ref 1500–7800)
Neutrophils Relative %: 59.5 %
Platelets: 209 10*3/uL (ref 140–400)
RBC: 3.36 10*6/uL — ABNORMAL LOW (ref 3.80–5.10)
RDW: 15.3 % — ABNORMAL HIGH (ref 11.0–15.0)
Total Lymphocyte: 19.4 %
WBC: 4.7 10*3/uL (ref 3.8–10.8)

## 2018-10-16 LAB — TSH: TSH: 0.98 mIU/L (ref 0.40–4.50)

## 2018-10-16 LAB — COMPLETE METABOLIC PANEL WITH GFR
AG Ratio: 1.1 (calc) (ref 1.0–2.5)
ALT: 14 U/L (ref 6–29)
AST: 23 U/L (ref 10–35)
Albumin: 4.1 g/dL (ref 3.6–5.1)
Alkaline phosphatase (APISO): 83 U/L (ref 37–153)
BUN/Creatinine Ratio: 43 (calc) — ABNORMAL HIGH (ref 6–22)
BUN: 68 mg/dL — ABNORMAL HIGH (ref 7–25)
CO2: 31 mmol/L (ref 20–32)
Calcium: 10.1 mg/dL (ref 8.6–10.4)
Chloride: 101 mmol/L (ref 98–110)
Creat: 1.57 mg/dL — ABNORMAL HIGH (ref 0.60–0.88)
GFR, Est African American: 33 mL/min/{1.73_m2} — ABNORMAL LOW (ref >=60)
GFR, Est Non African American: 28 mL/min/{1.73_m2} — ABNORMAL LOW (ref >=60)
Globulin: 3.8 g/dL (calc) — ABNORMAL HIGH (ref 1.9–3.7)
Glucose, Bld: 100 mg/dL — ABNORMAL HIGH (ref 65–99)
Potassium: 4.7 mmol/L (ref 3.5–5.3)
Sodium: 138 mmol/L (ref 135–146)
Total Bilirubin: 0.3 mg/dL (ref 0.2–1.2)
Total Protein: 7.9 g/dL (ref 6.1–8.1)

## 2018-10-16 LAB — LIPID PANEL
Cholesterol: 131 mg/dL (ref <200)
HDL: 47 mg/dL — ABNORMAL LOW (ref >=50)
LDL Cholesterol (Calc): 58 mg/dL (calc)
Non-HDL Cholesterol (Calc): 84 mg/dL (calc) (ref <130)
Total CHOL/HDL Ratio: 2.8 (calc) (ref <5.0)
Triglycerides: 188 mg/dL — ABNORMAL HIGH (ref <150)

## 2018-10-16 LAB — HEMOGLOBIN A1C
Hgb A1c MFr Bld: 6.3 % of total Hgb — ABNORMAL HIGH (ref <5.7)
Mean Plasma Glucose: 134 (calc)
eAG (mmol/L): 7.4 (calc)

## 2018-10-16 IMAGING — XA IR REPLACE G/J TUBE W/ FLUORO
1 series · 4 of 4 positions shown · non-contrast
Comparison: Fluoroscopic guided gastrojejunostomy catheter exchange
-[DATE]/ 3087;

INDICATION: History of dysphagia with chronic gastrojejunostomy catheter.

[Series 300: dr. (person_name) · 4 of 4 slices shown]
[im 1/4]
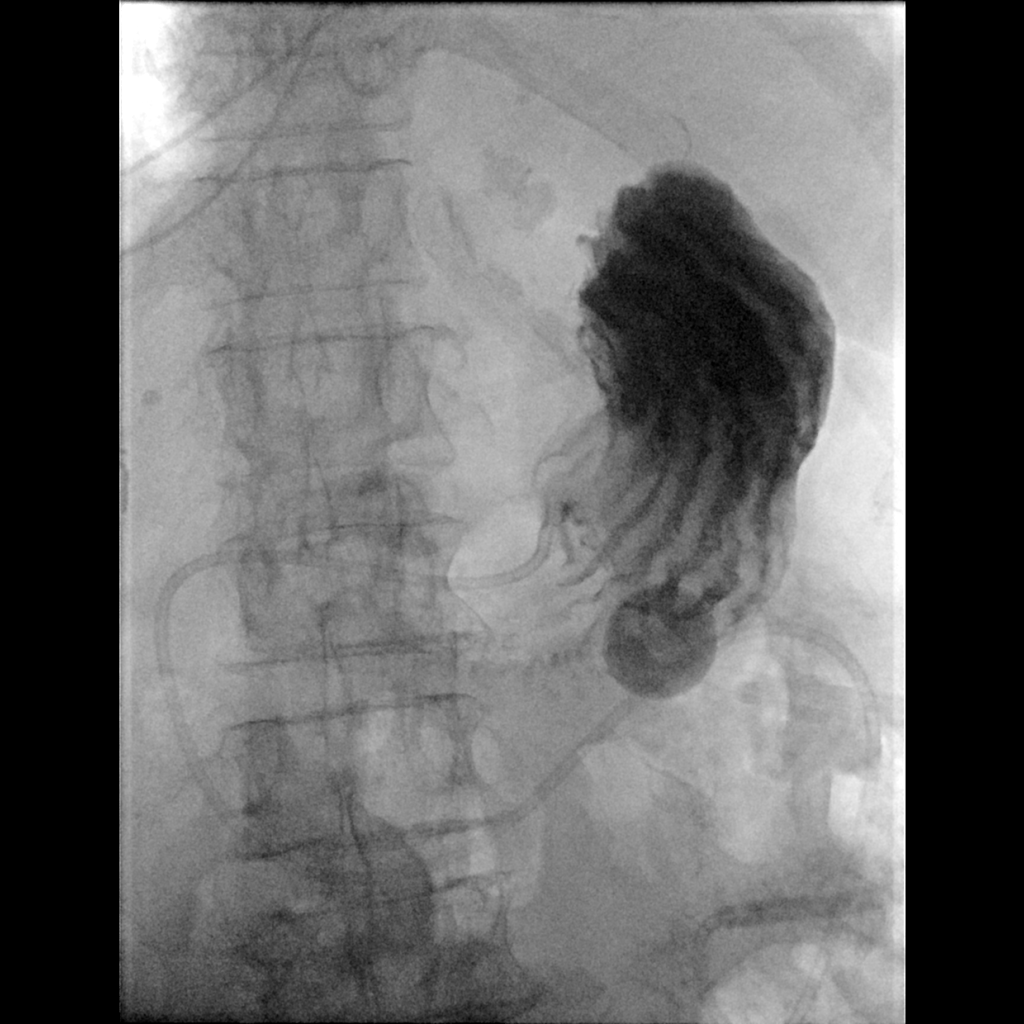
[im 2/4]
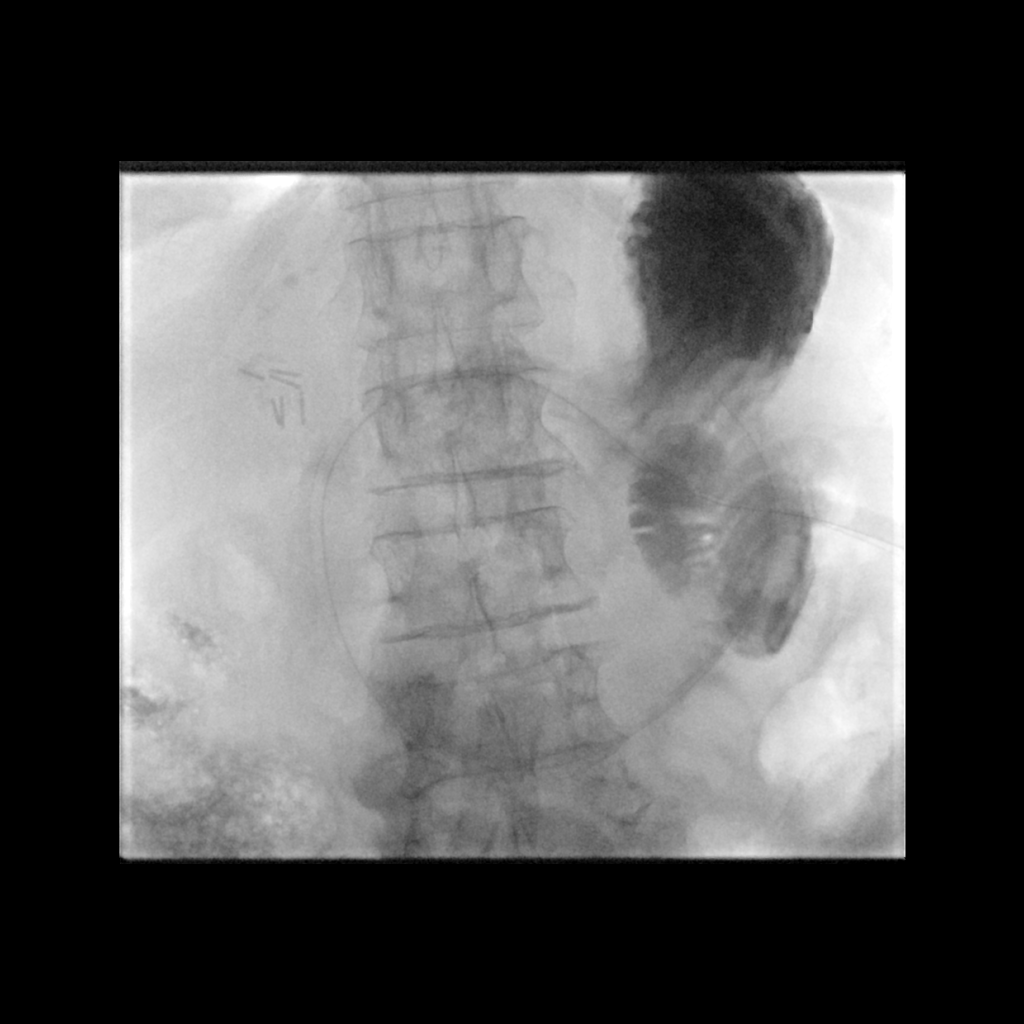
[im 3/4]
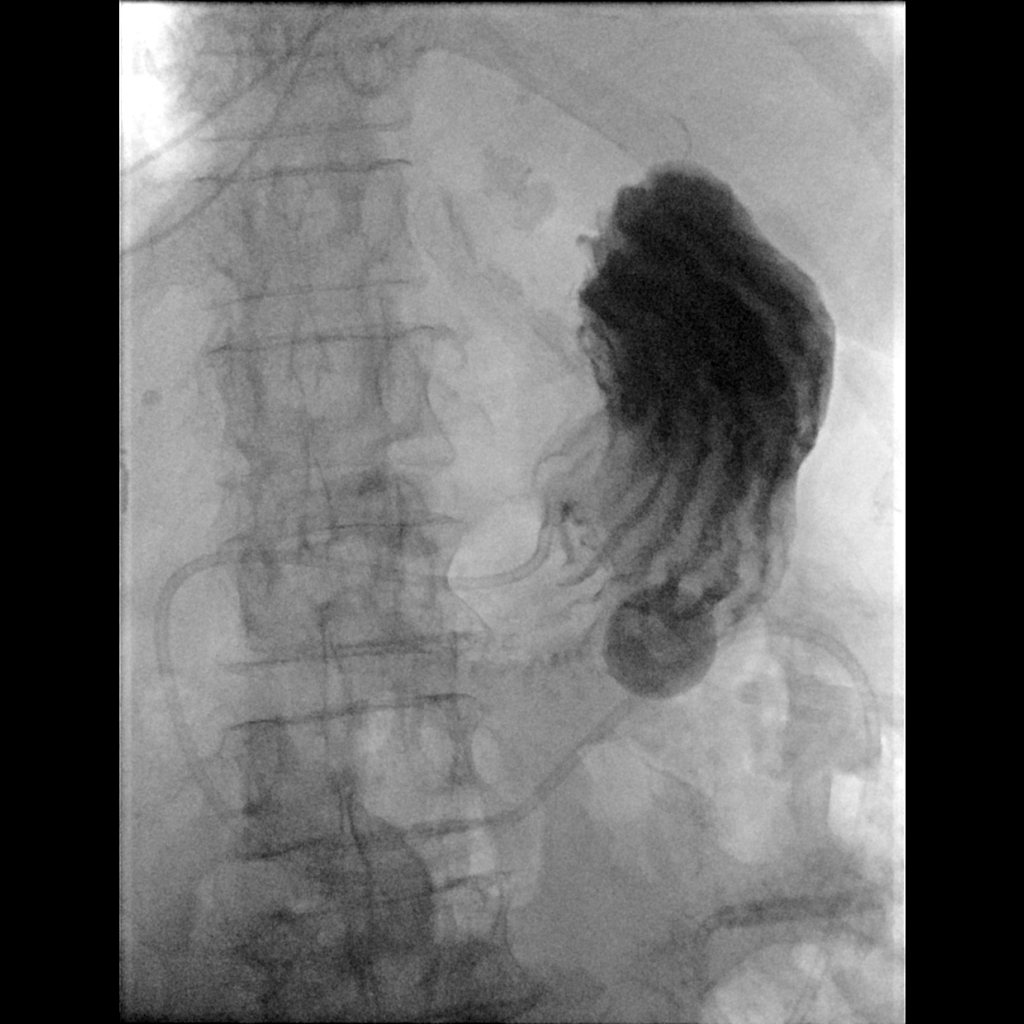
[im 4/4]
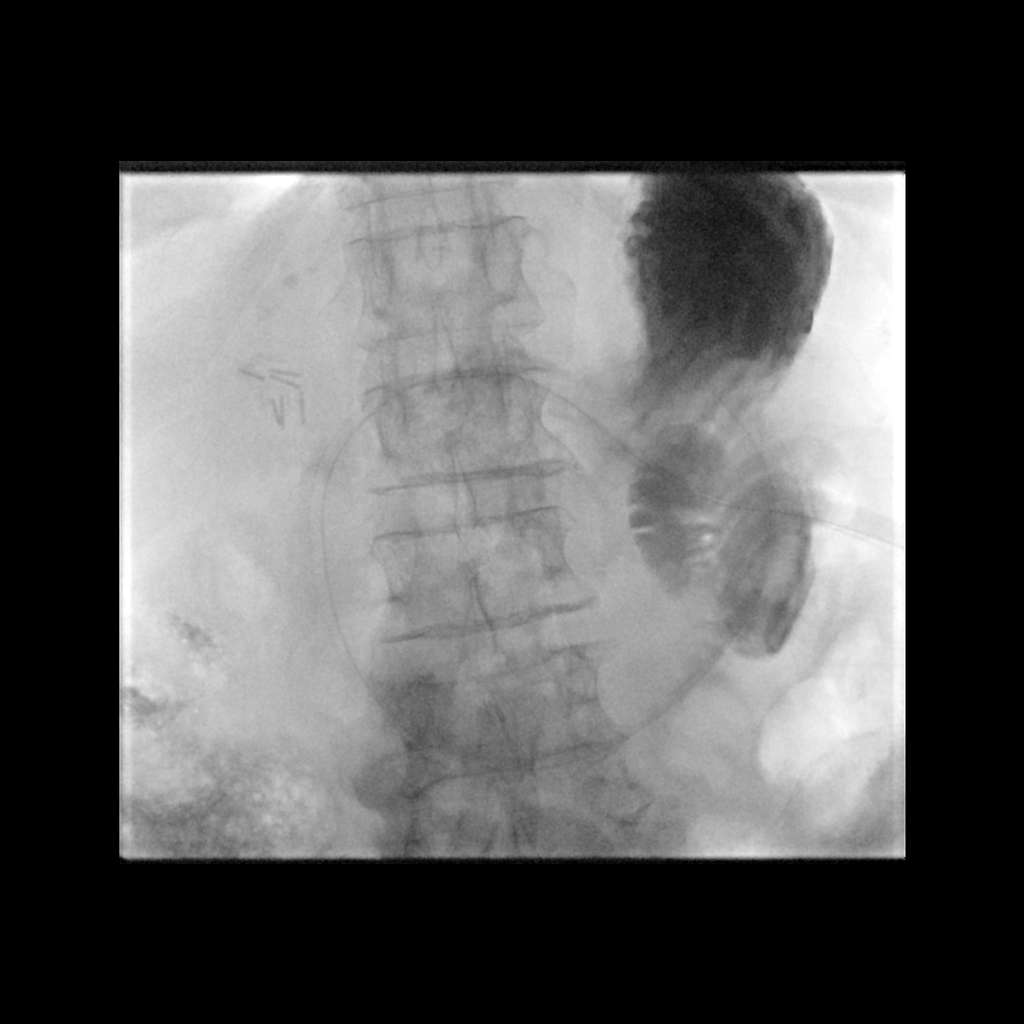

[4 of 4 positions shown; findings below may reference images not displayed]

Unfortunately, the patient's gastrojejunostomy catheter has become
frequently clogged despite vigorous flushing following medication
and enteric nutrition administration.

Additionally, the gastrostomy lumen is rarely use.

Please perform fluoroscopic guided exchange/conversion.

EXAM:
FLUOROSCOPIC GUIDED CONVERSION OF EXISTING GASTROJEJUNOSTOMY TUBE TO
A JEJUNOSTOMY TUBE.
02/08/2016; 01/27/2016; 01/07/2016

MEDICATIONS:
None

CONTRAST:  20 cc Isovue 300, administered into the enteric system.

FLUOROSCOPY TIME:  7 minutes 18 seconds (89.1 mGy).

COMPLICATIONS:
None immediate.

PROCEDURE:
Despite vigorous attempted flushing an use of a stiff Amplatz wire,
the jejunostomy lumen of the gastrojejunostomy catheter could not be
on clogged.

As such, the external portion of the gastrojejunostomy catheter was
cut and the tube was removed intact.

With the use of a Kumpe the catheter, a stiff Glidewire was advanced
out the gastric antrum and into the proximal jejunum. Contrast
injection confirmed appropriate positioning

Under intermittent fluoroscopic guidance, a 22 French jejunostomy
catheter was advanced through the gastrojejunostomy track with tip
ultimately terminating within the proximal jejunum.

The retention balloon was inflated and the balloon was cinched.
Contrast was injected and several spot fluoroscopic images were
obtained in various obliquities confirming intraluminal positioning.
The patient tolerated the procedure well without immediate
postprocedural complication.
IMPRESSION: Successful fluoroscopic guided conversion of existing
gastrojejunostomy catheter to a new 22-French jejunostomy tube. This
conversion was performed a secondary to recurrent occlusion of the
jejunostomy lumen as well as lack of frequent usage of the
gastrostomy lumen. The lumen of the new 22 French jejunostomy
catheter is much larger in caliber and therefore has a decreased
risk of occlusion. The new jejunostomy tube is ready for immediate
use.

## 2018-10-16 IMAGING — RF DG SWALLOWING FUNCTION - NRPT [facility]
1 series · 18 of 24 positions shown · non-contrast
Comparison: none

[Series 1: run · 21 acquisitions, 18 frames shown]
[im 1/21]
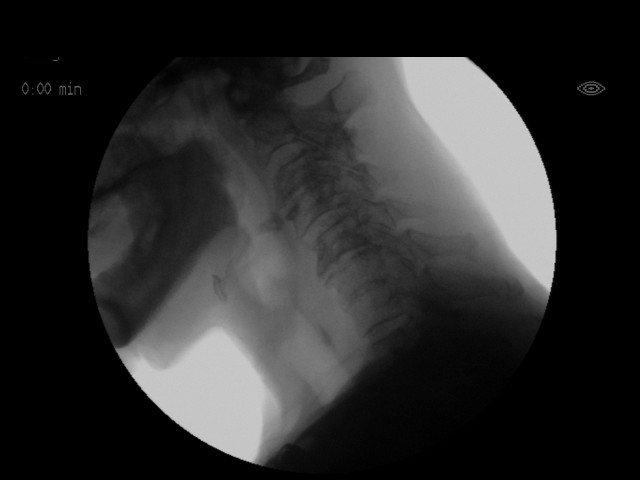
[im 2/21]
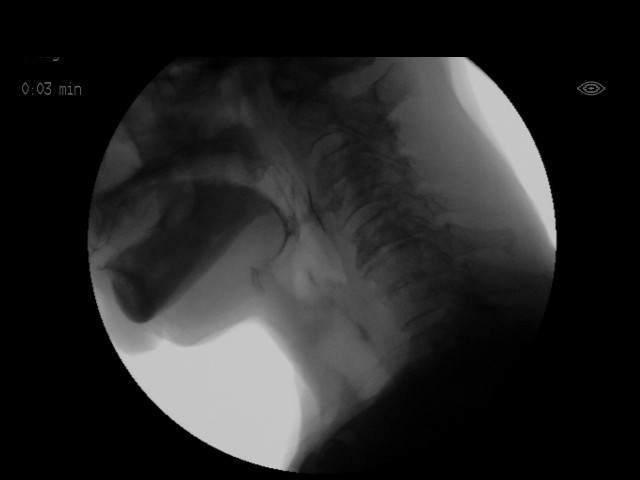
[im 3/21]
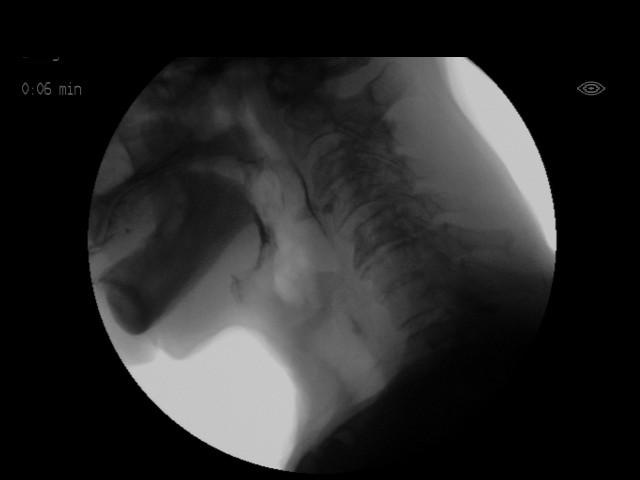
[im 4/21]
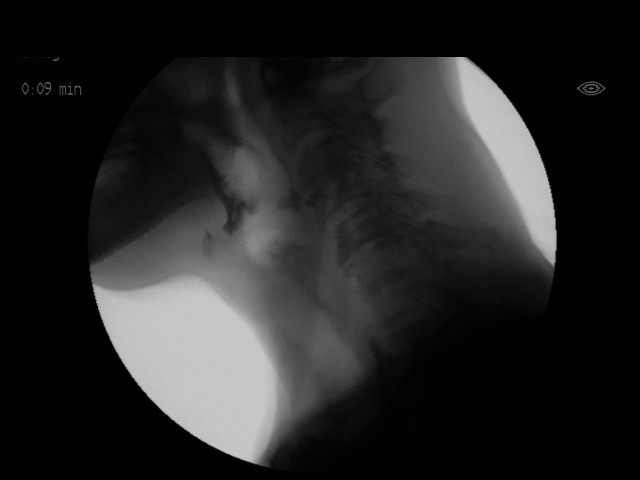
[im 6/21]
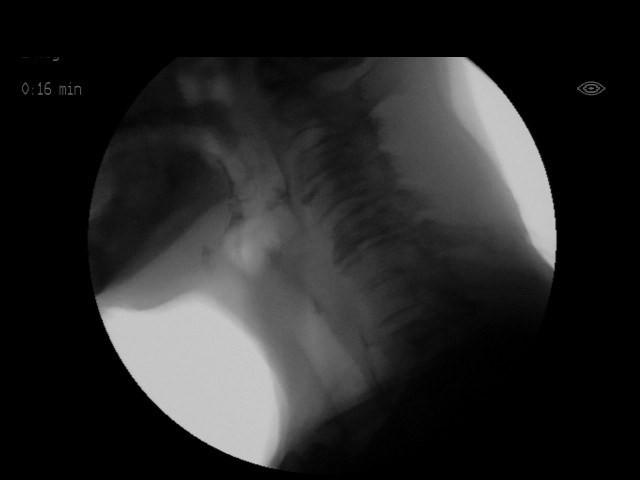
[im 7/21]
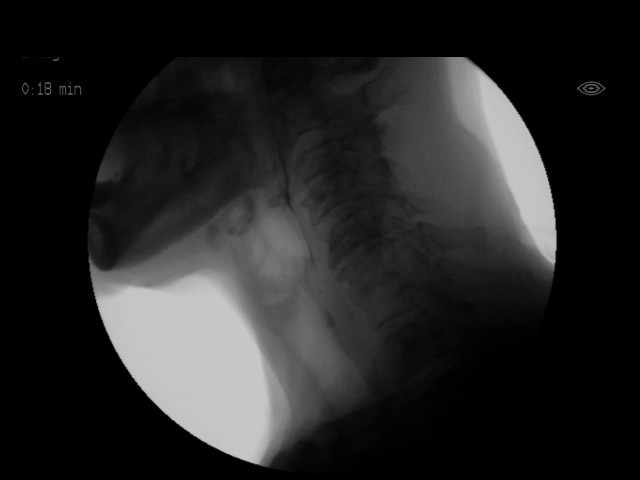
[im 8/21]
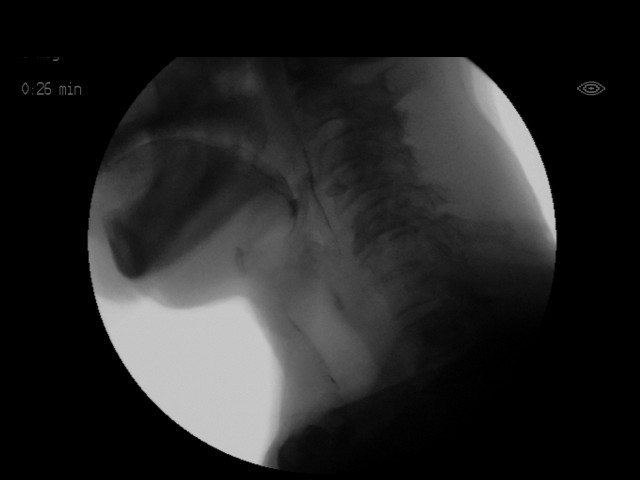
[im 10/21]
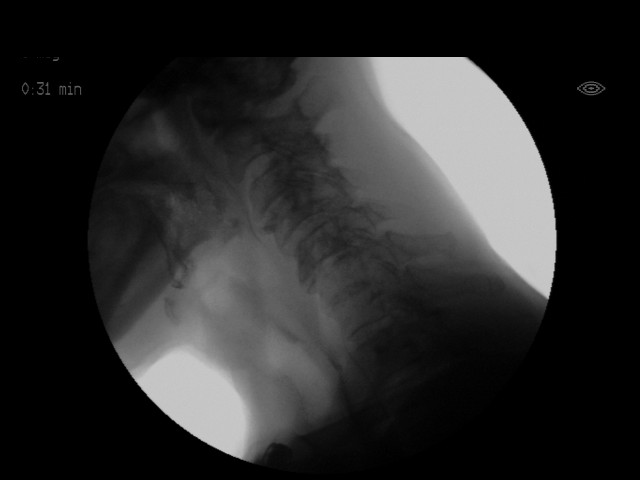
[im 11/21]
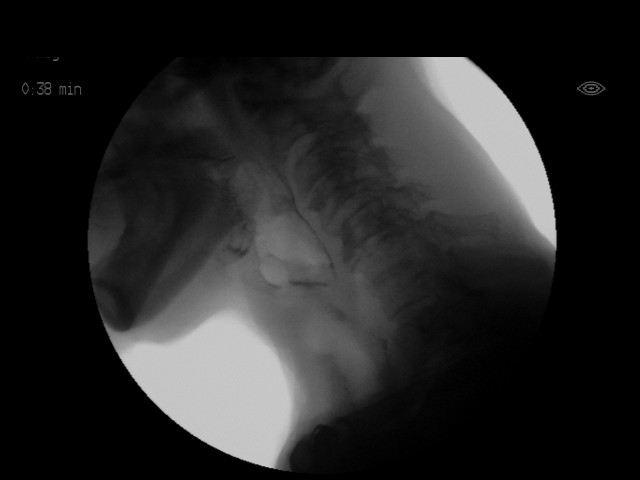
[im 11/21]
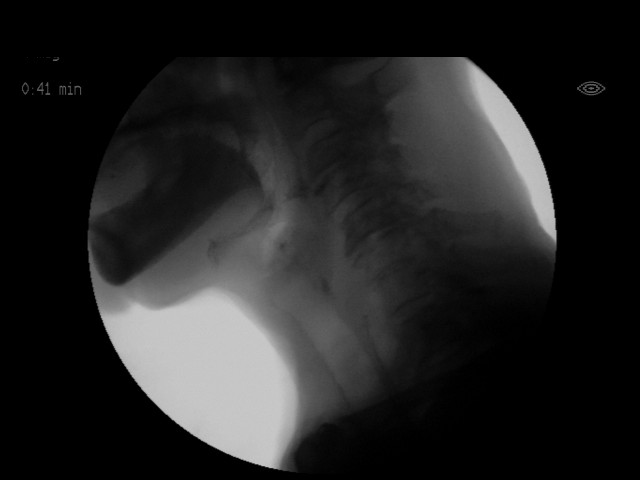
[im 13/21]
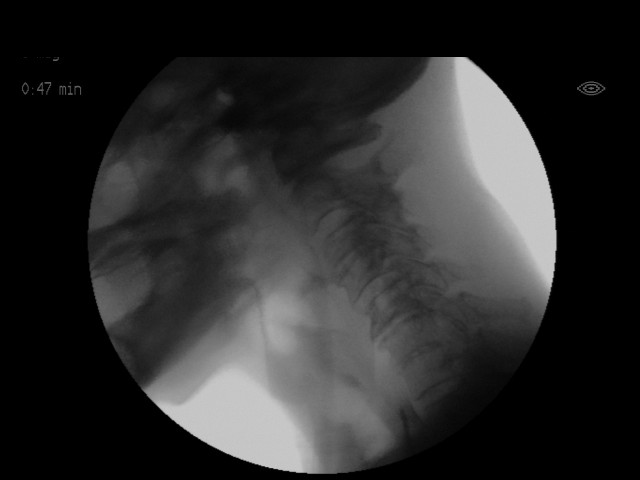
[im 14/21]
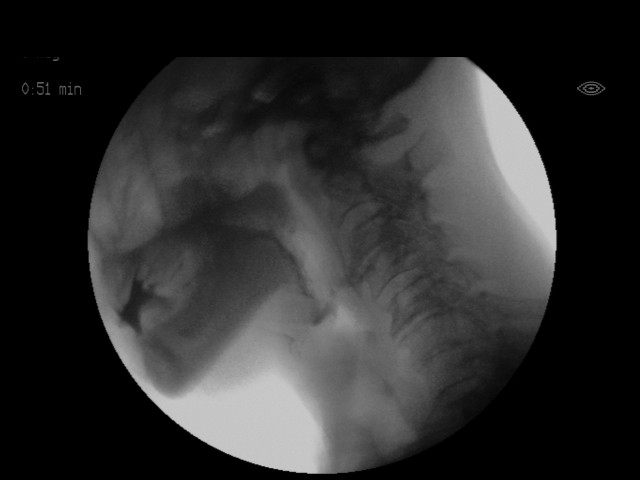
[im 15/21]
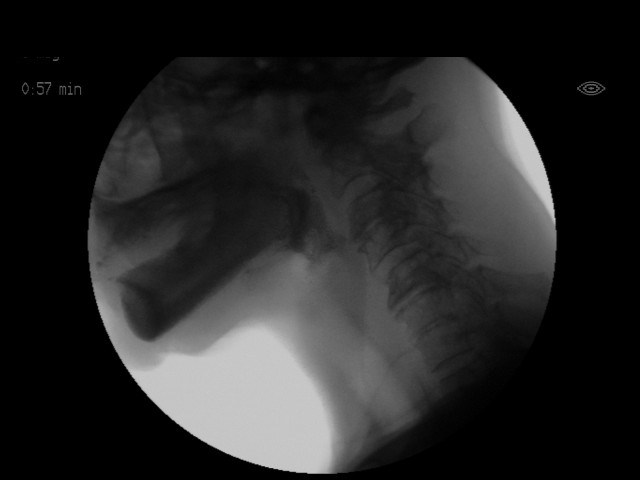
[im 17/21]
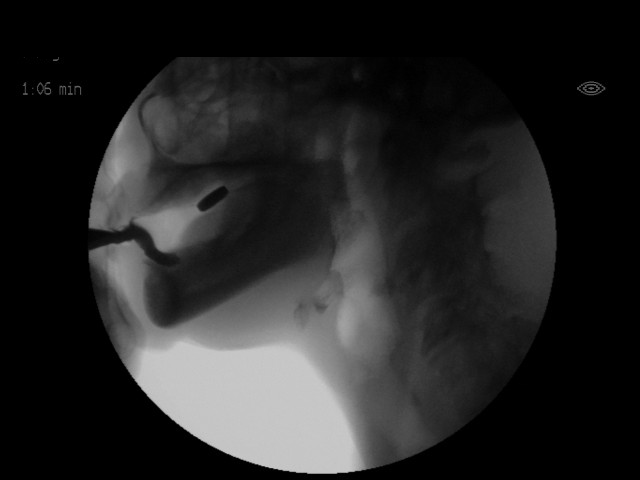
[im 18/21]
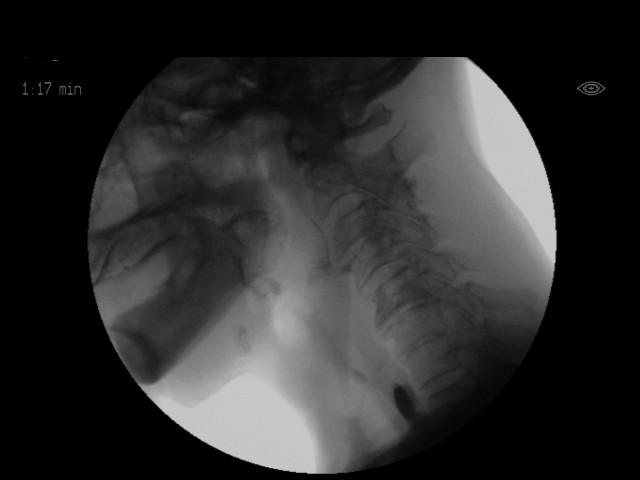
[im 19/21]
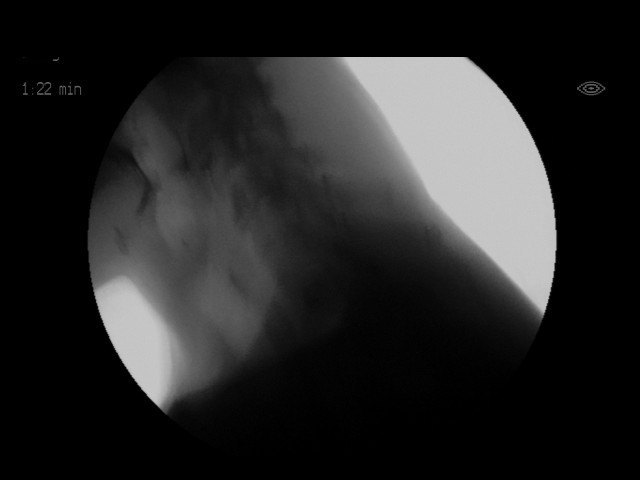
[im 20/21]
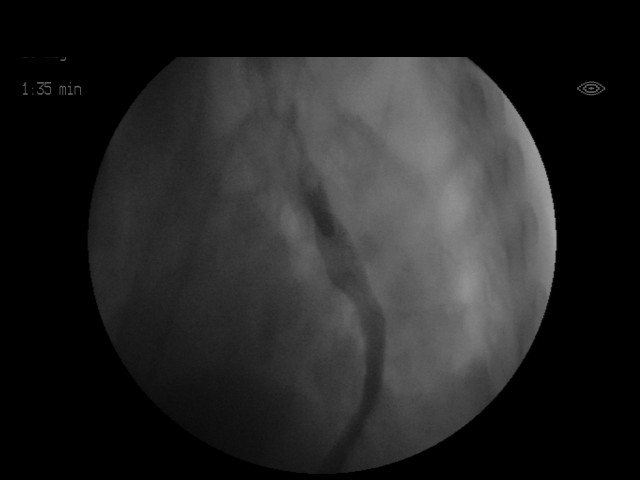
[im 21/21]
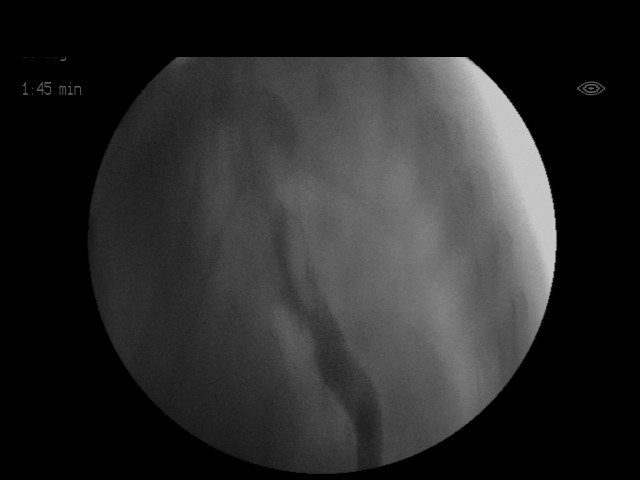

[18 of 24 positions shown; findings below may reference images not displayed]

FLUOROSCOPY FOR SWALLOWING FUNCTION STUDY:
Fluoroscopy was provided for swallowing function study, which was administered by a speech pathologist.  Final results and recommendations from this study are contained within the speech pathology report.

## 2018-10-16 NOTE — Progress Notes (Signed)
HPI: 85 year old female presents the office today for follow-up evaluation of ulcerations noted to digits 2-4 of the right foot. She states she is doing well. She has been using the Betadine and post op shoe as directed. There are no modifying factors noted. Patient is here for further evaluation and treatment.   Past Medical History:  Diagnosis Date  . Acute respiratory failure ([place])   . Anemia    previous blood transfusions  . Arthritis    "all over"  . Asthma   . Atrial flutter ([place])   . Bradycardia    requiring previous d/c of BB and reduction of amiodarone  . CAD (coronary artery disease)    nonobstructive per notes  . Chronic diastolic CHF (congestive heart failure) ([place])   . CKD (chronic kidney disease), stage III ([place])   . Complication of blood transfusion    "got the wrong blood type at [facility] in ~ 2015; no adverse reaction that we are aware of"/daughter, [name] (01/27/2016)  . COPD (chronic obstructive pulmonary disease) ([place])   . Depression    "light case"  . DVT (deep venous thrombosis) ([place]) 01/2016   a. LLE DVT 01/2016 - switched from Eliquis to Coumadin.  [name] Dyspnea    with some activity  . Gastric stenosis    a. s/p stomach tube  . GERD (gastroesophageal reflux disease)   . History of blood transfusion    "several" (01/27/2016)  . History of stomach ulcers   . Hyperlipidemia   . Hypertension   . Hypothyroidism   . On home oxygen therapy    "2L; 9PM til 9AM" (06/27/2017)  . PAD (peripheral artery disease) ([place])    a. prior gangrene, toe amputations, intervention.  [name] PAF (paroxysmal atrial fibrillation) ([place])   . Paraesophageal hernia   . Perforated gastric ulcer ([place])   . Pneumonia    "a few times" (06/27/2017)  . Seasonal allergies   . SIADH (syndrome of inappropriate ADH production) ([place])    [name] 01/10/2015  . Small bowel obstruction ([place])    "I don't know how many" (01/11/2015)  . Stroke [facility])    several- no residual  . Type II diabetes  mellitus ([place])    "related to prednisone use  for > 20 yr; once predinose stopped; no more DM RX" (01/27/2016)  . UTI (urinary tract infection) 02/08/2016  . Ventral hernia with bowel obstruction      Physical Exam: General: The patient is alert and oriented x3 in no acute distress.  Dermatology: Wounds noted to digits 3-5 of the right foot have healed. Complete re-epithelialization has occurred. No drainage noted.  Wound #1 noted to the 2nd toe of the right foot measuring 0.3 x 0.3 x 0.2 cm.   To the above-noted ulceration, there is no eschar. There is a moderate amount of slough, fibrin and necrotic tissue. Granulation tissue and wound base is red. There is no malodor. There is a minimal amount of serosanginous drainage noted. Periwound integrity is intact.   Vascular: Diminished but palpable pedal pulses bilaterally. No edema or erythema noted.   Neurological: Epicritic and protective threshold absent bilaterally.   Musculoskeletal Exam: Status post partial first ray amputation right foot with complete healing  Radiographic Exam: No changes noted compared to prior exam.   Assessment: 1. Ulceration of digits 3-5 right foot - healed  2. Ulceration noted to the 2nd toe of the right foot  3. Diabetes mellitus 4. Peripheral vascular disease 5. Status post partial first  ray amputation right   Plan of Care:  1. Patient evaluated. X-Rays reviewed.  2. Medically necessary excisional debridement including subcutaneous tissue was performed using a tissue nipper.  Excisional debridement of all necrotic nonviable tissue down to healthy bleeding viable tissue was performed with post debridement measurement same as pre- 3. Dry sterile dressing applied.  4. Continue using Betadine daily with a dry sterile dressing.  5. Continue using post op shoe on the right foot.  6. Return to clinic in 4 weeks.    Edrick Kins, DPM [facility]  Dr. Edrick Kins, DPM    [address], [place] [address]                Office [phone]  Fax [phone]

## 2018-10-22 ENCOUNTER — Ambulatory Visit: Payer: Medicare Other | Admitting: Nurse Practitioner

## 2018-10-23 IMAGING — CR DG CHEST 1V PORT
1 series · 1 of 1 positions shown · non-contrast
Comparison: Chest radiograph 05/11/2016

CLINICAL DATA: [AGE] female with shortness of breath.
Respiratory distress. Recently hospitalized for pneumonia.

EXAM:
PORTABLE CHEST 1 VIEW

[AP]
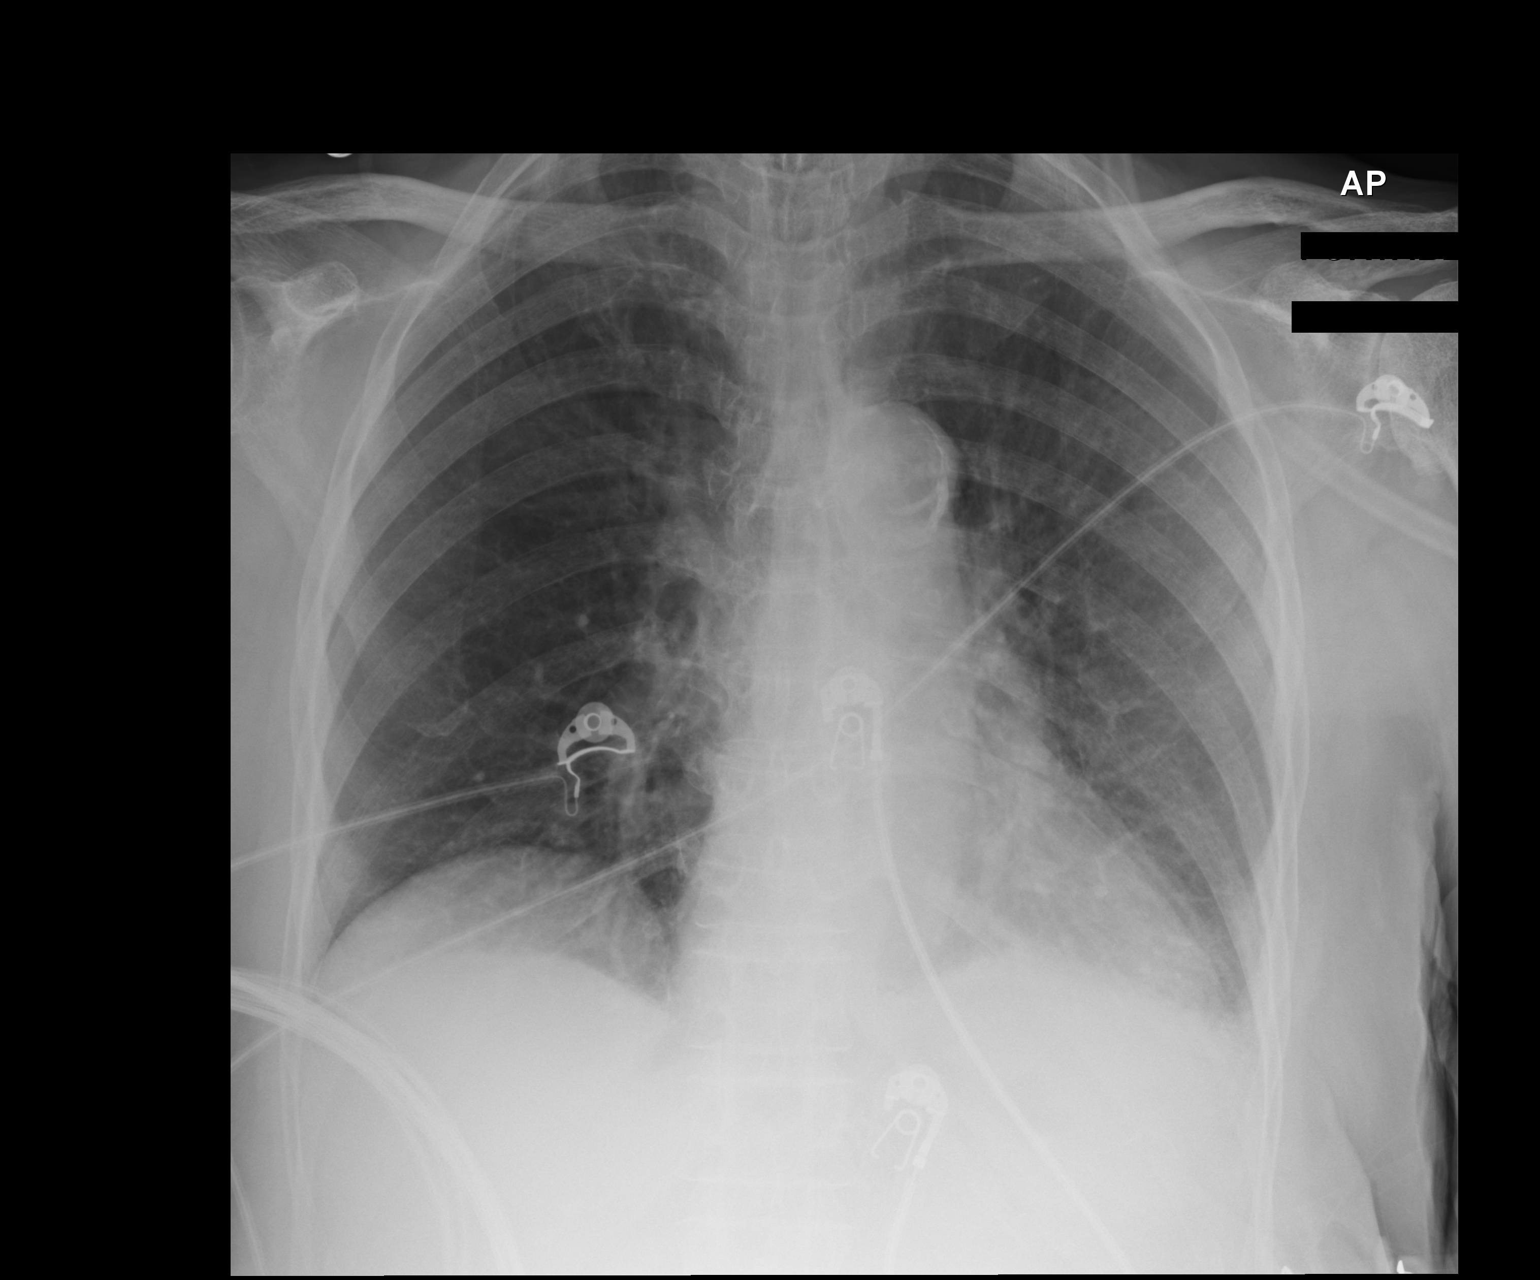

[1 of 1 positions shown; findings below may reference images not displayed]

FINDINGS: There has been interval improvement of the previously seen left lung
base airspace opacities. Minimal residual density at the left lung
base may represent atelectasis versus residual infiltrate. There is
a small left pleural effusion. The right lung is clear. There is no
pneumothorax. The cardiac silhouette is within normal limits. There
is atherosclerotic calcification of the aortic arch. No acute
osseous pathology.
IMPRESSION: Interval improvement of the left lung base opacity with near
complete resolution. Small left pleural effusion with minimal left
lung base atelectasis versus residual infiltrate. Clinical
correlation and follow-up recommended.

## 2018-10-28 IMAGING — CT CT CHEST W/O CM
2 of 5 series · 13 of 36 positions shown, 16 images · non-contrast
Comparison: 05/22/2016 and prior radiographs.  07/07/2015 chest CT.

CLINICAL DATA: Follow-up pneumonia.

EXAM:
CT CHEST WITHOUT CONTRAST
TECHNIQUE: Multidetector CT imaging of the chest was performed following the
standard protocol without IV contrast.

[Series 4: thorax 2.0 · axial · 0.60mm/px · z∈[+1088,+1340]mm · 10 of 155 slices shown, 13 images]
[im 15/155  mediastinal]
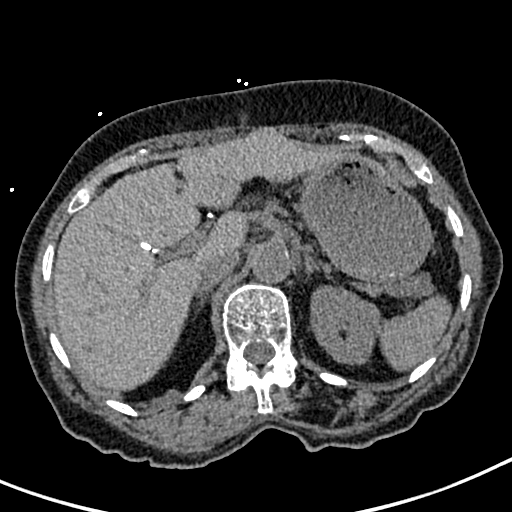
[im 15/155  lung]
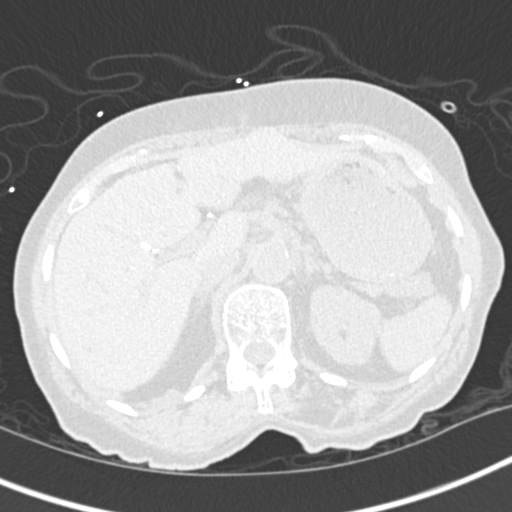
[im 29/155  lung]
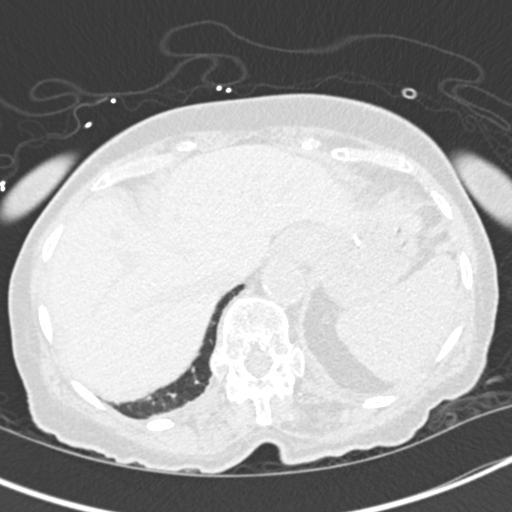
[im 43/155  lung]
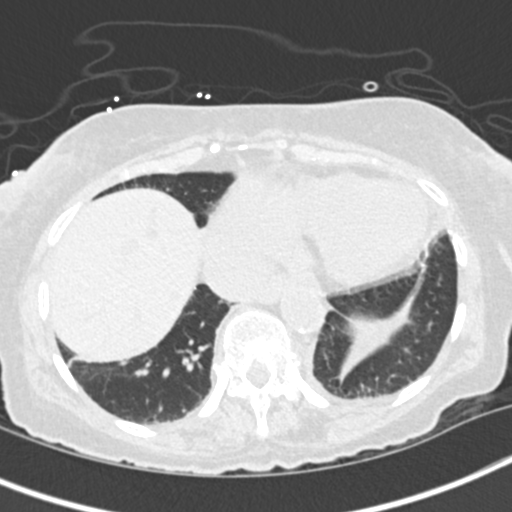
[im 57/155  lung]
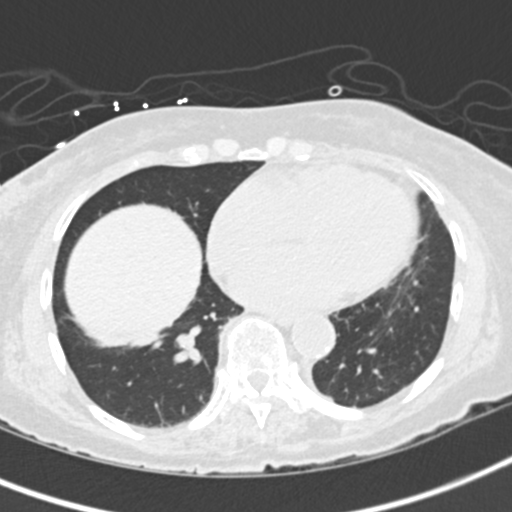
[im 71/155  mediastinal]
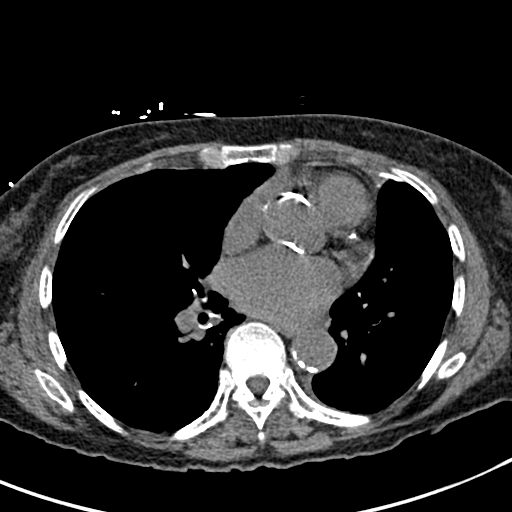
[im 71/155  lung]
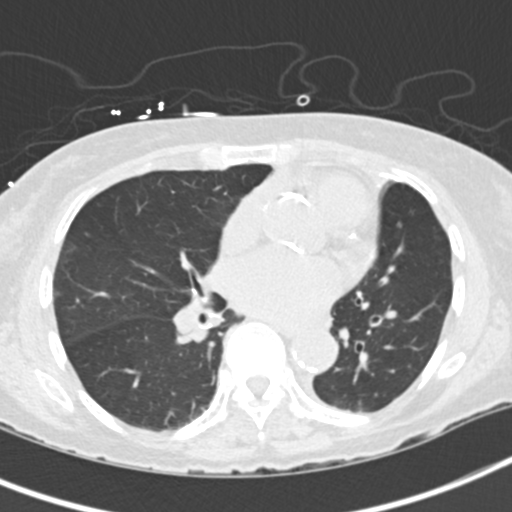
[im 85/155  lung]
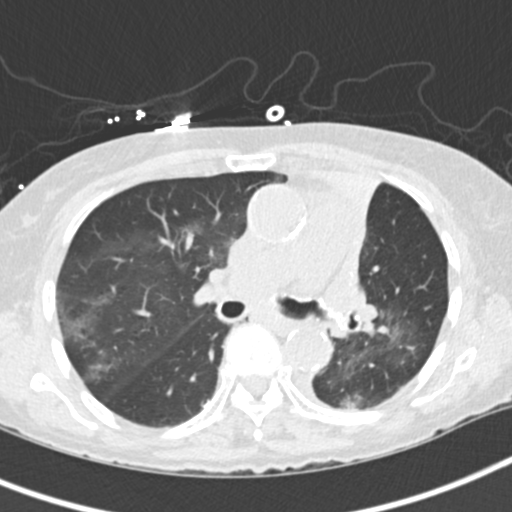
[im 99/155  lung]
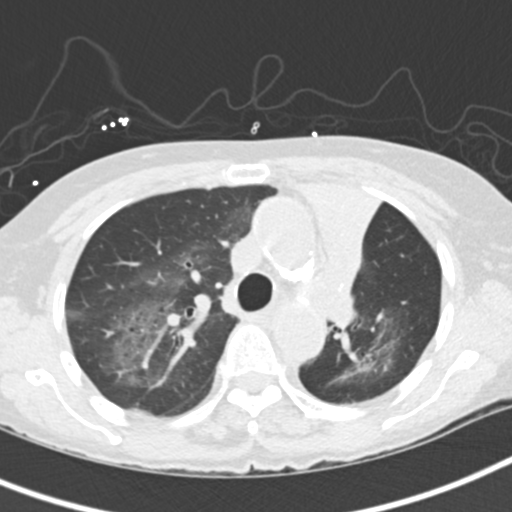
[im 113/155  lung]
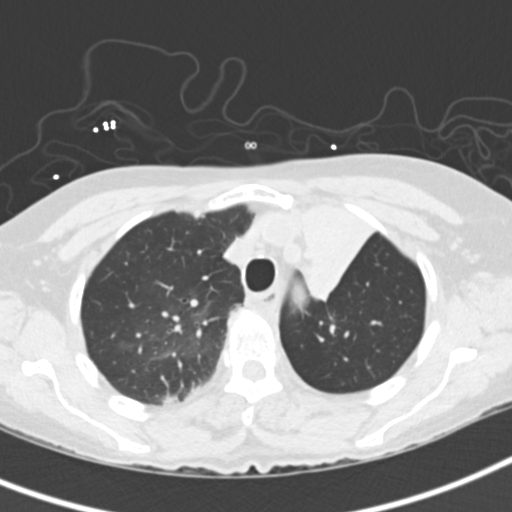
[im 127/155  mediastinal]
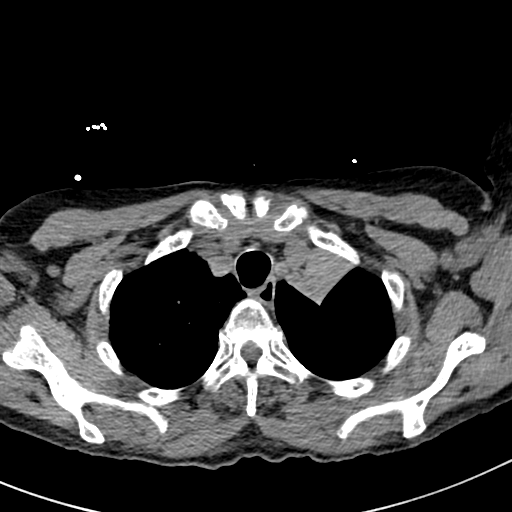
[im 127/155  lung]
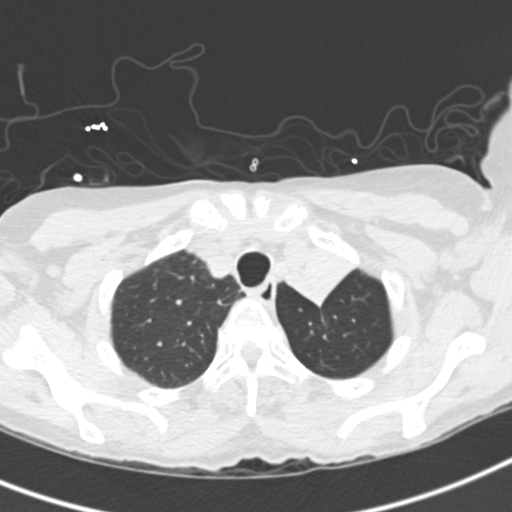
[im 141/155  lung]
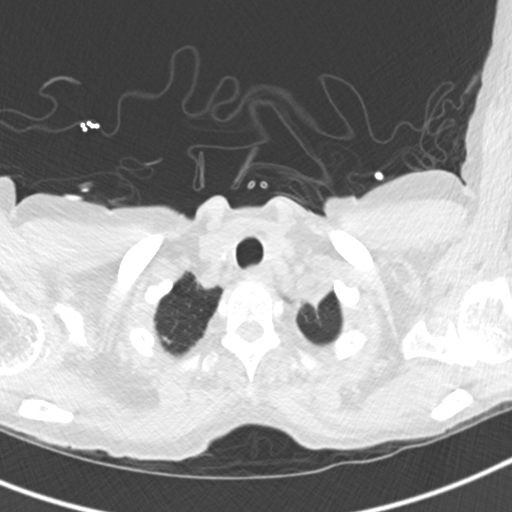

[Series 5: coronal · coronal · 0.58mm/px · 3 of 71 slices shown]
[im 15/71  lung]
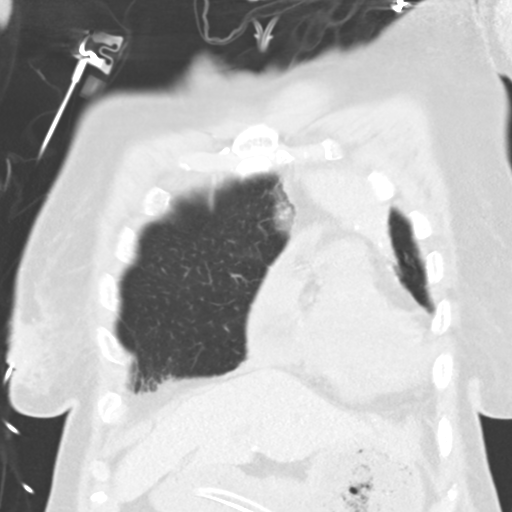
[im 29/71  lung]
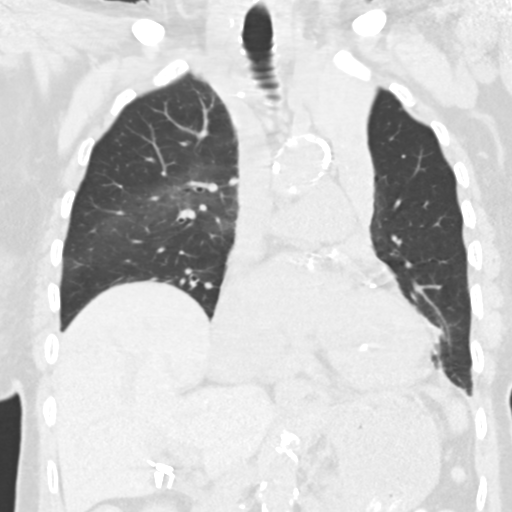
[im 43/71  lung]
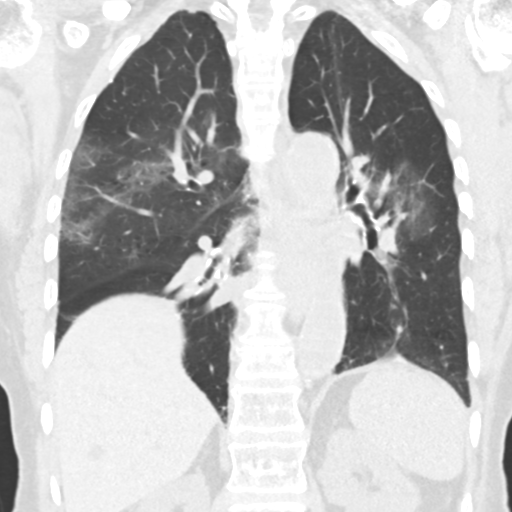

[13 of 36 positions shown; findings below may reference images not displayed]

FINDINGS: Cardiovascular: Cardiomegaly and coronary artery calcifications
again noted. Thoracic aortic atherosclerotic calcifications again
identified without aneurysm. No pericardial effusion identified.

Mediastinum/Nodes: No mediastinal mass or enlarged lymph nodes.

Lungs/Pleura: Left upper lobe collapse is identified without
definite obstructing cause. There are several calcifications within
the collapsed left upper lobe.

Central ground-glass opacities within both lungs are noted and
nonspecific. This may represent edema, nonspecific inflammation or
infection.

Mucous filling several right lower lobe bronchi are noted.

Upper Abdomen: An NG tube in the stomach is noted. No acute
abnormalities identified.

Musculoskeletal: No acute or suspicious abnormality. A T11
compression fracture is unchanged.
IMPRESSION: Left upper lobe collapse without identifiable cause on this study.
Broncholiths within the collapsed left upper lobe identified.

Nonspecific bilateral central ground-glass opacities -question
edema, infection or nonspecific inflammation.

Cardiomegaly, coronary artery disease and thoracic atherosclerotic
calcifications.

## 2018-10-30 ENCOUNTER — Telehealth: Payer: Self-pay | Admitting: Pharmacist

## 2018-10-30 IMAGING — CR DG CHEST 2V
2 series · 2 of 2 positions shown · non-contrast
Comparison: CT T 07/21/2016.  Plain film 05/22/2016

CLINICAL DATA: Pneumonia

EXAM:
CHEST  2 VIEW

[chest lat]
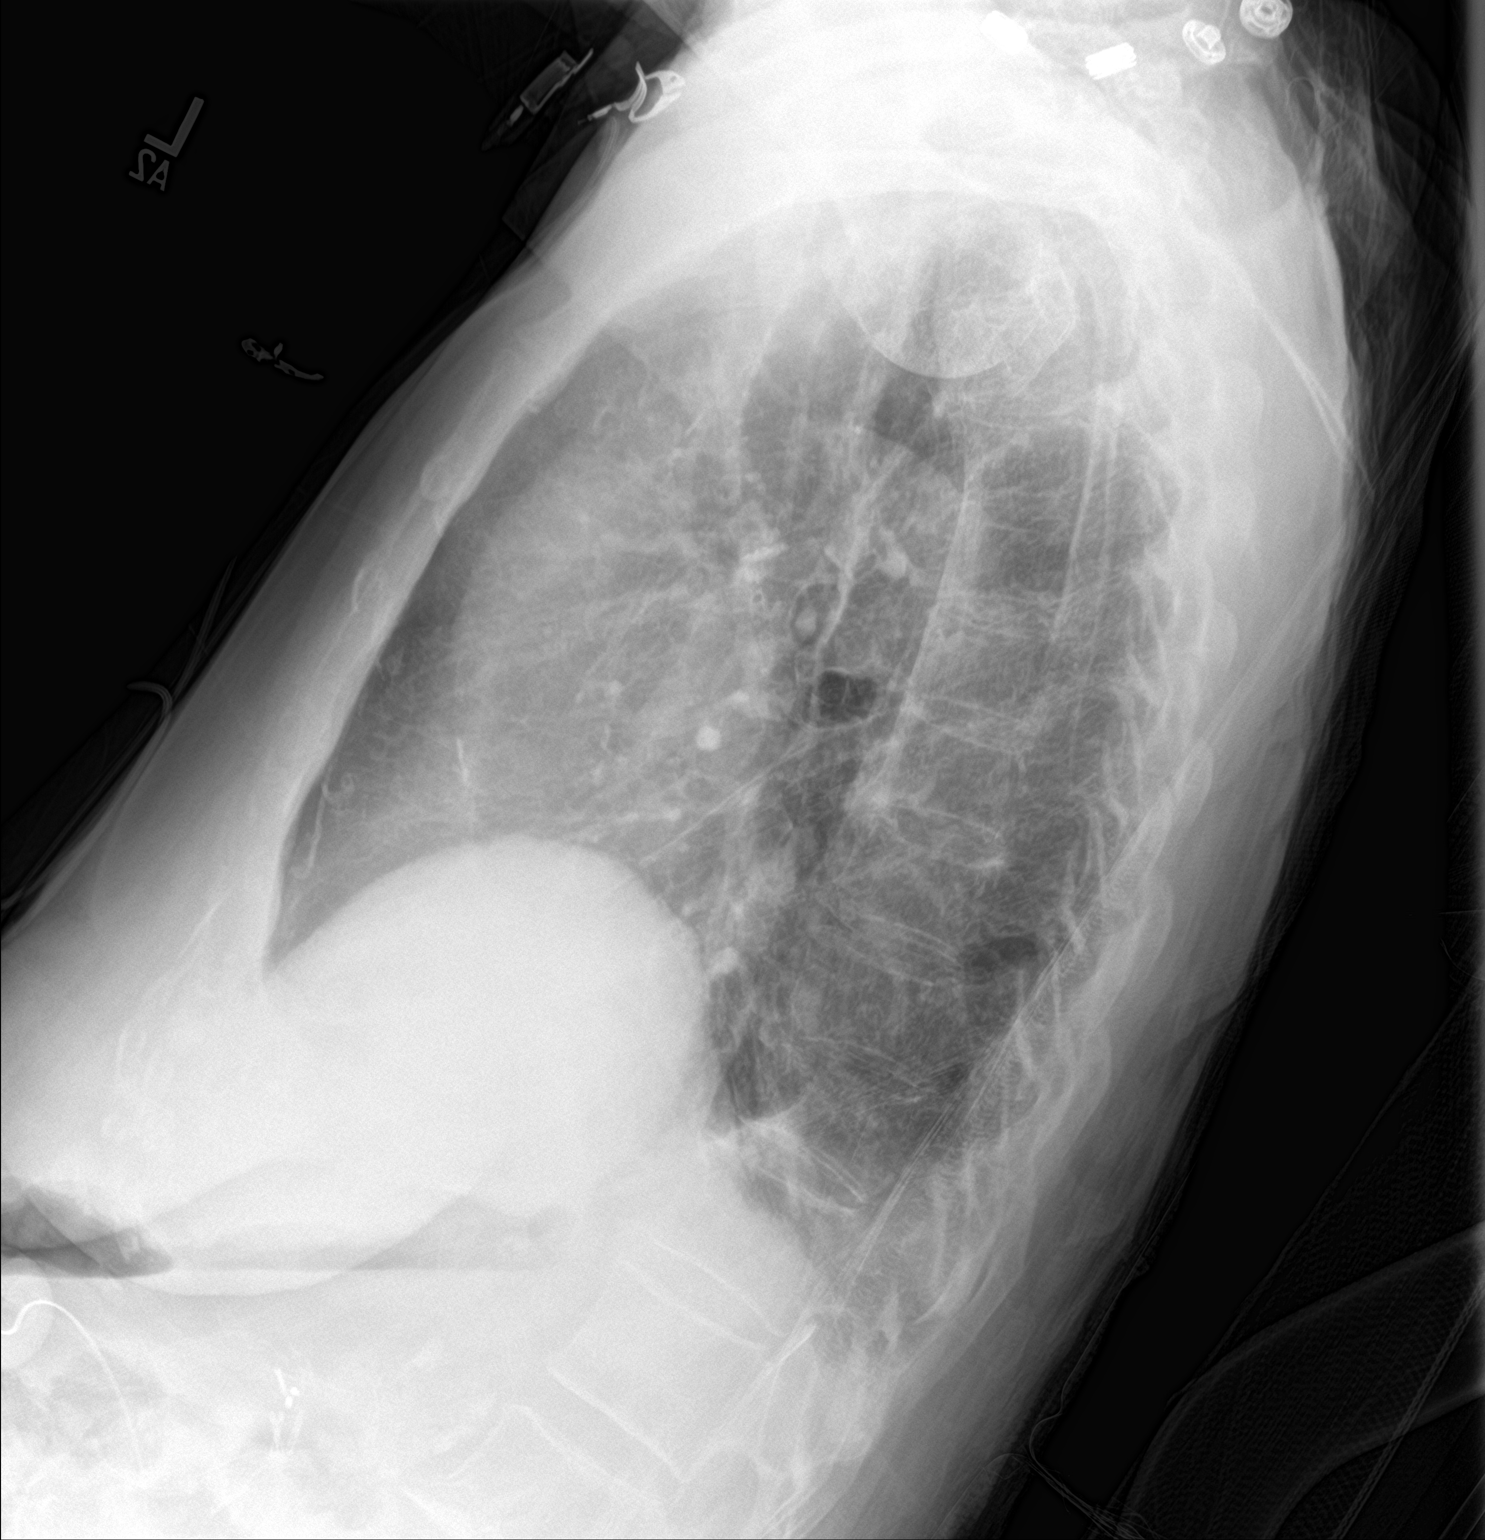

[chest ap]
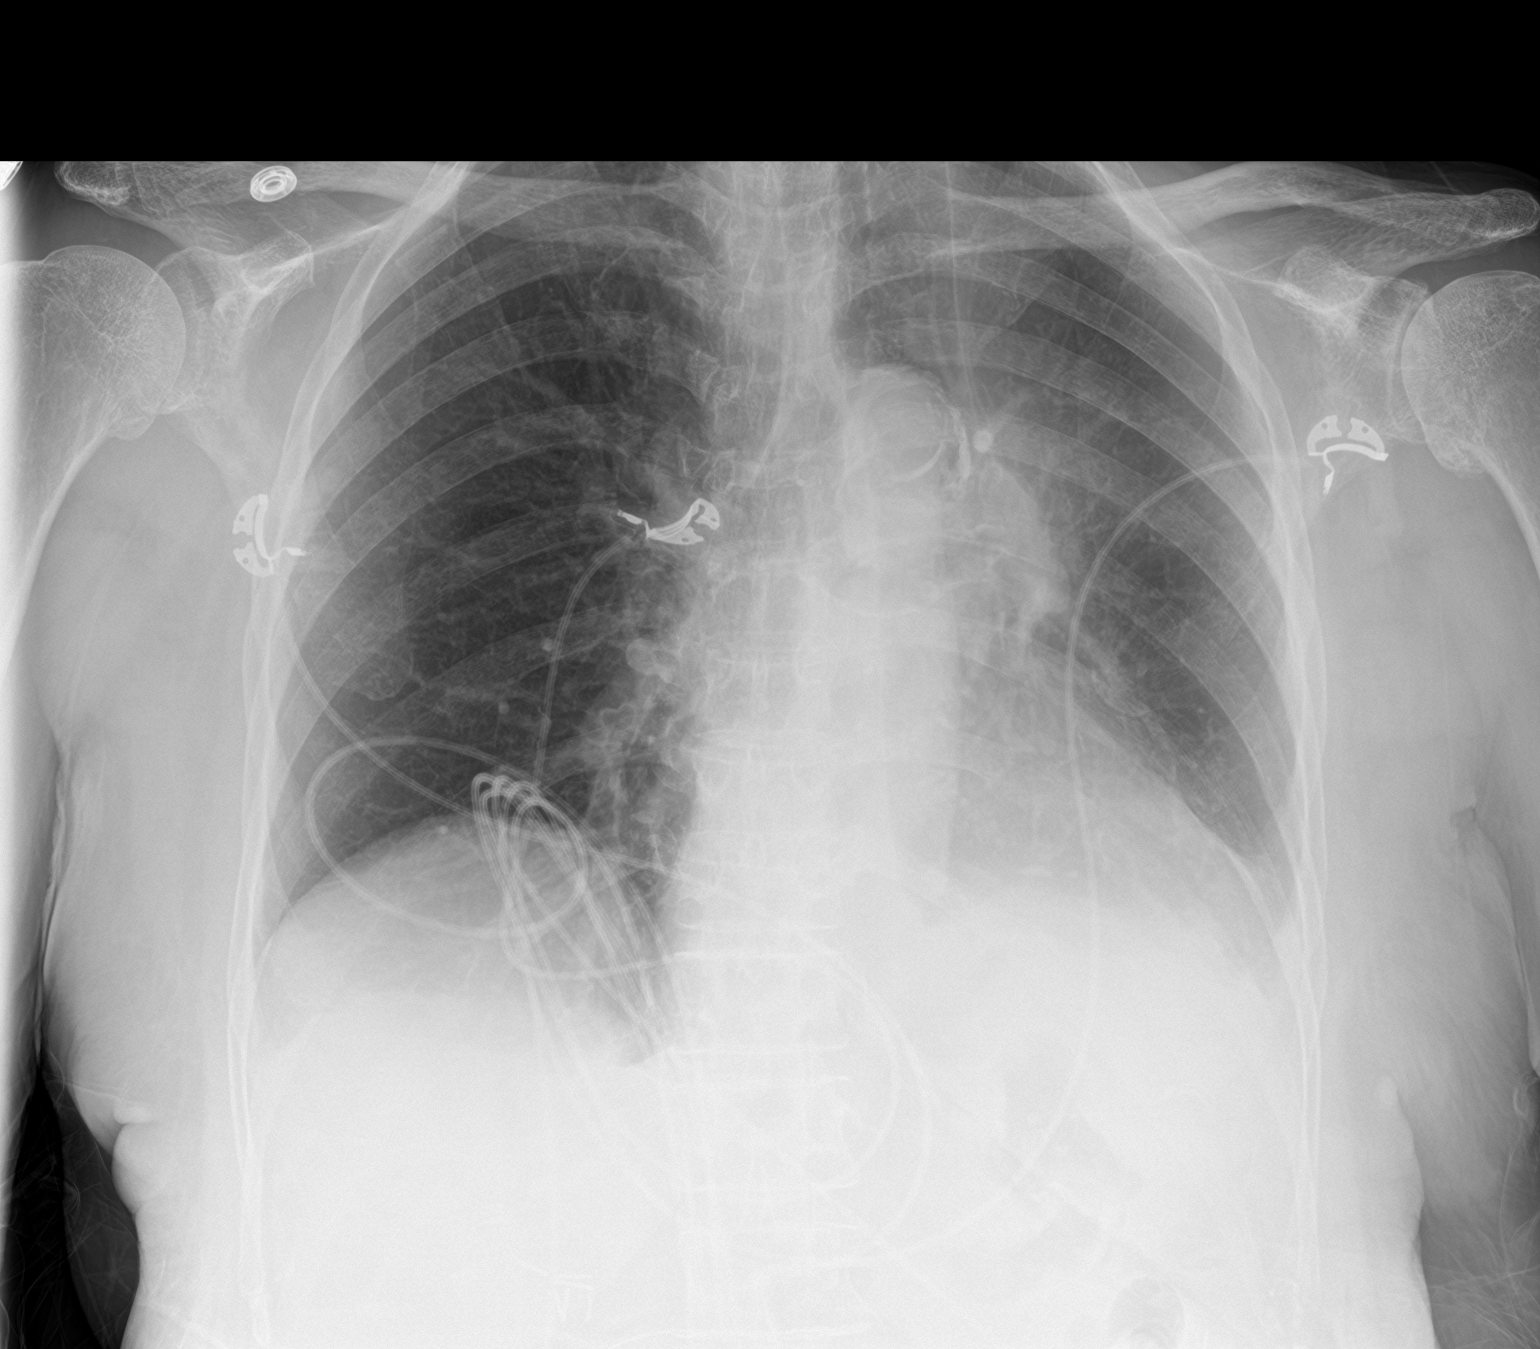

[2 of 2 positions shown; findings below may reference images not displayed]

FINDINGS: Left upper lobe collapse again suspected as seen on prior chest CT,
likely not significantly changed. Right lung is clear. Heart is
borderline in size. No effusions or acute bony abnormality.
IMPRESSION: Probable continued left upper lobe collapse.

Mild cardiomegaly.

## 2018-10-30 NOTE — Telephone Encounter (Signed)
1. COVID-19 Pre-Screening Questions:  . In the past 7 to 10 days have you had a cough,  shortness of breath, headache, congestion, fever (100 or greater) body aches, chills, sore throat, or sudden loss of taste or sense of smell? No . Have you been around anyone with known Covid 19? No . Have you been around anyone who is awaiting Covid 19 test results in the past 7 to 10 days? No . Have you been around anyone who has been exposed to Covid 19, or has mentioned symptoms of Covid 19 within the past 7 to 10 days? No    2. Pt advised of visitor restrictions (no visitors allowed except if needed to conduct the visit). Also advised to arrive at appointment time and wear a mask.     

## 2018-11-01 ENCOUNTER — Other Ambulatory Visit: Payer: Self-pay | Admitting: Nurse Practitioner

## 2018-11-01 ENCOUNTER — Ambulatory Visit (INDEPENDENT_AMBULATORY_CARE_PROVIDER_SITE_OTHER): Payer: Medicare Other | Admitting: *Deleted

## 2018-11-01 ENCOUNTER — Other Ambulatory Visit: Payer: Self-pay

## 2018-11-01 DIAGNOSIS — Z5181 Encounter for therapeutic drug level monitoring: Secondary | ICD-10-CM

## 2018-11-01 DIAGNOSIS — I4892 Unspecified atrial flutter: Secondary | ICD-10-CM

## 2018-11-01 DIAGNOSIS — K922 Gastrointestinal hemorrhage, unspecified: Secondary | ICD-10-CM

## 2018-11-01 LAB — POCT INR: INR: 2.3 (ref 2.0–3.0)

## 2018-11-01 NOTE — Patient Instructions (Signed)
Description   Continue taking 2 tablets daily except 3 tablets on Sundays. Recheck in 4 weeks.  Call [facility] with any new medications [phone].

## 2018-11-09 IMAGING — CR DG CHEST 1V PORT
1 series · 1 of 1 positions shown · non-contrast
Comparison: Chest x-ray of 05/29/2016

CLINICAL DATA: Bradycardia

EXAM:
PORTABLE CHEST 1 VIEW

[AP]
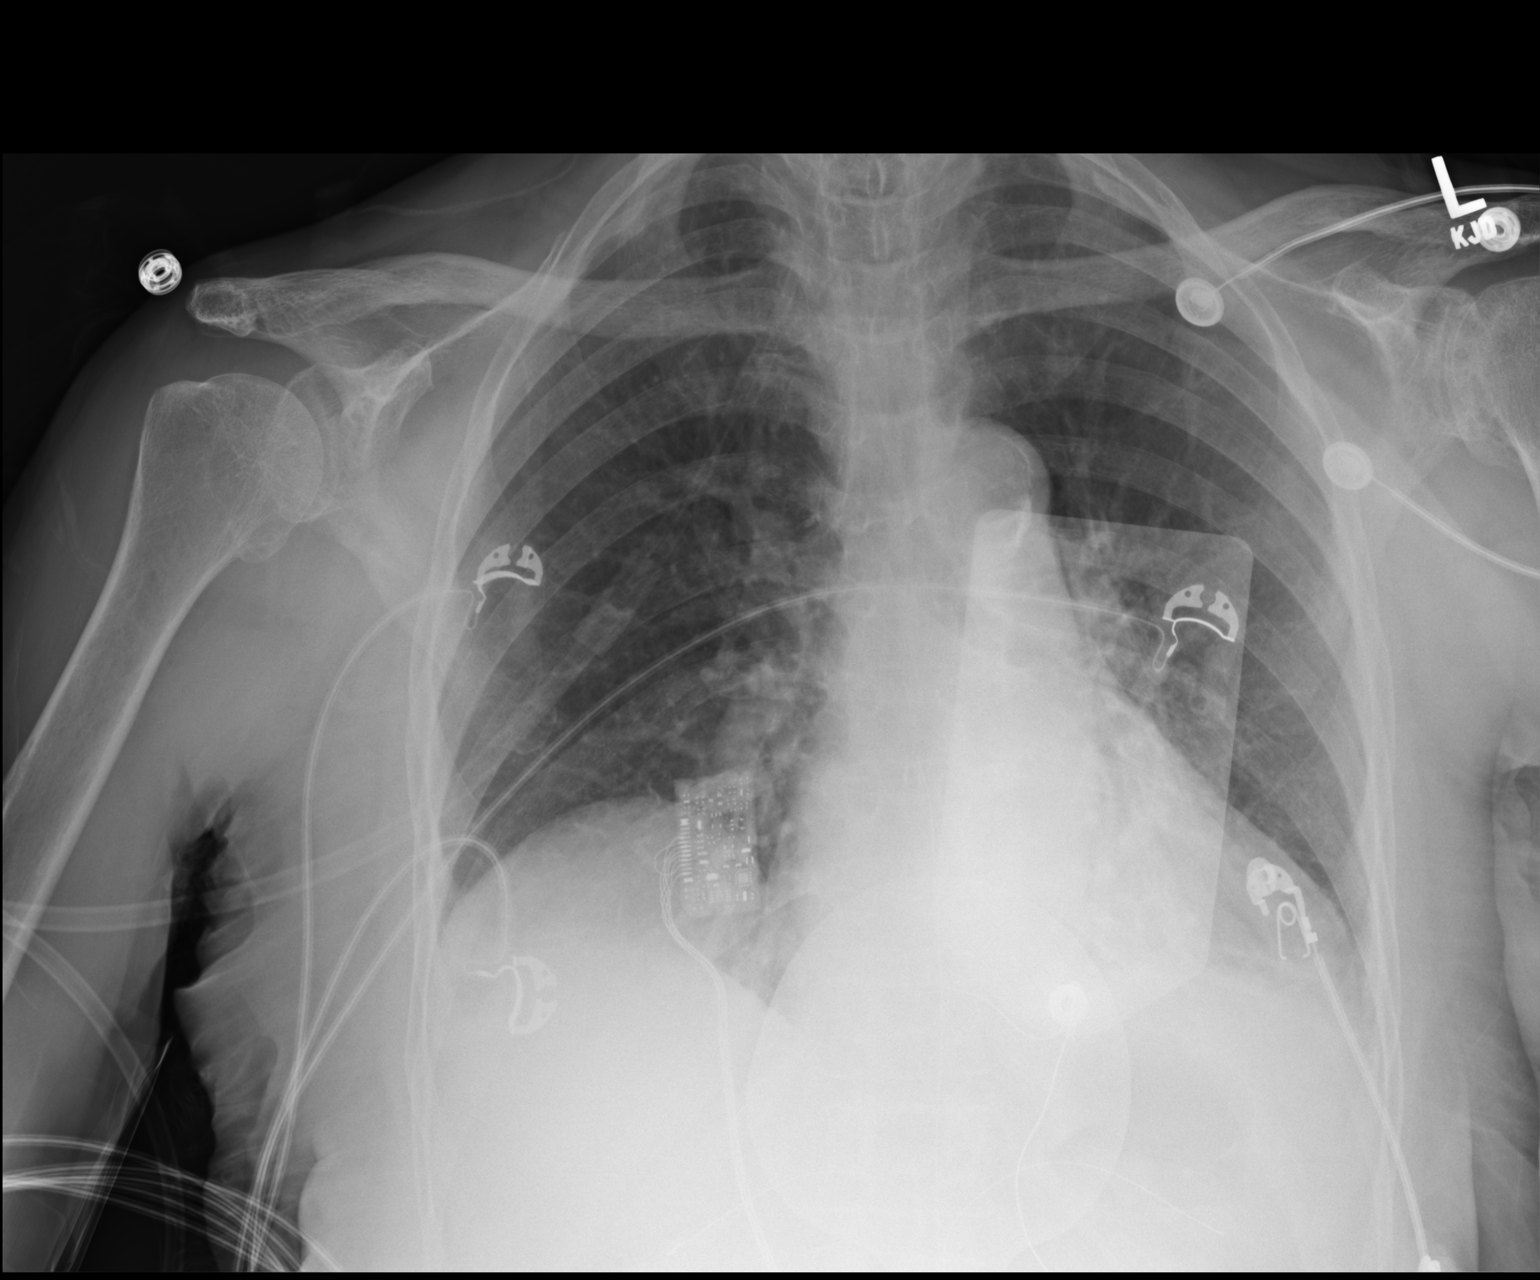

[1 of 1 positions shown; findings below may reference images not displayed]

FINDINGS: Aeration of the left upper lobe appears to have improved. There is
mild left basilar atelectasis present. Mediastinal and hilar
contours are unremarkable. Mild cardiomegaly is stable. No bony
abnormality is seen other than degenerative change particularly in
the left shoulder.
IMPRESSION: Improved aeration of the left upper lobe. Mild left basilar
atelectasis.

## 2018-11-10 ENCOUNTER — Other Ambulatory Visit: Payer: Self-pay | Admitting: Nurse Practitioner

## 2018-11-11 ENCOUNTER — Ambulatory Visit (INDEPENDENT_AMBULATORY_CARE_PROVIDER_SITE_OTHER): Payer: Medicare Other | Admitting: Podiatry

## 2018-11-11 ENCOUNTER — Encounter: Payer: Self-pay | Admitting: Podiatry

## 2018-11-11 ENCOUNTER — Other Ambulatory Visit: Payer: Self-pay | Admitting: *Deleted

## 2018-11-11 ENCOUNTER — Other Ambulatory Visit: Payer: Self-pay

## 2018-11-11 VITALS — Temp 97.6°F

## 2018-11-11 DIAGNOSIS — E0859 Diabetes mellitus due to underlying condition with other circulatory complications: Secondary | ICD-10-CM

## 2018-11-11 DIAGNOSIS — L97512 Non-pressure chronic ulcer of other part of right foot with fat layer exposed: Secondary | ICD-10-CM

## 2018-11-11 NOTE — Patient Outreach (Signed)
[facility]) Care Management  11/11/2018  Shannon [name] [birth year] [id number]   Telephone Assessment  RN attempted outreach call to pt today however unsuccessful. RN able to leave a HIPAA approved voice message requesting a call back.  Will follow up once again in 4 business days for ongoing [facility] services and update the plan of care accordingly.  [name], RN Care Management Coordinator [place] Office [phone]

## 2018-11-13 NOTE — Progress Notes (Signed)
Subjective:  85 y.o. female with PMHx of diabetes mellitus presenting today for follow up evaluation of ulcerations of the feet bilaterally. She reports some associated pain. She has been using Betadine ointment and the post op shoe as directed. There are no worsening factors noted. She denies any new complaints at this time. Patient is here for further evaluation and treatment.    Past Medical History:  Diagnosis Date  . Acute respiratory failure ([place])   . Anemia    previous blood transfusions  . Arthritis    "all over"  . Asthma   . Atrial flutter ([place])   . Bradycardia    requiring previous d/c of BB and reduction of amiodarone  . CAD (coronary artery disease)    nonobstructive per notes  . Chronic diastolic CHF (congestive heart failure) ([place])   . CKD (chronic kidney disease), stage III ([place])   . Complication of blood transfusion    "got the wrong blood type at [facility] in ~ 2015; no adverse reaction that we are aware of"/daughter, [name] (01/27/2016)  . COPD (chronic obstructive pulmonary disease) ([place])   . Depression    "light case"  . DVT (deep venous thrombosis) ([place]) 01/2016   a. LLE DVT 01/2016 - switched from Eliquis to Coumadin.  [name] Dyspnea    with some activity  . Gastric stenosis    a. s/p stomach tube  . GERD (gastroesophageal reflux disease)   . History of blood transfusion    "several" (01/27/2016)  . History of stomach ulcers   . Hyperlipidemia   . Hypertension   . Hypothyroidism   . On home oxygen therapy    "2L; 9PM til 9AM" (06/27/2017)  . PAD (peripheral artery disease) ([place])    a. prior gangrene, toe amputations, intervention.  [name] PAF (paroxysmal atrial fibrillation) ([place])   . Paraesophageal hernia   . Perforated gastric ulcer ([place])   . Pneumonia    "a few times" (06/27/2017)  . Seasonal allergies   . SIADH (syndrome of inappropriate ADH production) ([place])    [name] 01/10/2015  . Small bowel obstruction ([place])    "I don't know how many"  (01/11/2015)  . Stroke [facility])    several- no residual  . Type II diabetes mellitus ([place])    "related to prednisone use  for > 20 yr; once predinose stopped; no more DM RX" (01/27/2016)  . UTI (urinary tract infection) 02/08/2016  . Ventral hernia with bowel obstruction       Objective/Physical Exam General: The patient is alert and oriented x3 in no acute distress.  Dermatology:  Wounds noted to the 2, 3 and 4 digits of the right foot, all measuring 0.4 x 0.4 x 0.1 cm (LxWxD).   To the noted ulceration(s), there is no eschar. There is a moderate amount of slough, fibrin, and necrotic tissue noted. Granulation tissue and wound base is red. There is a minimal amount of serosanguineous drainage noted. There is no exposed bone muscle-tendon ligament or joint. There is no malodor. Periwound integrity is intact. Skin is warm, dry and supple bilateral lower extremities.  Vascular: Palpable pedal pulses bilaterally. No edema or erythema noted. Capillary refill within normal limits.  Neurological: Epicritic and protective threshold diminished bilaterally.   Musculoskeletal Exam: Range of motion within normal limits to all pedal and ankle joints bilateral. Muscle strength 5/5 in all groups bilateral.   Assessment: 1. Ulcerations noted to digits 2, 3, and 4 secondary to diabetes mellitus 2. diabetes mellitus w/  peripheral neuropathy   Plan of Care:  1. Patient was evaluated. 2. medically necessary excisional debridement including subcutaneous tissue was performed using a tissue nipper and a chisel blade. Excisional debridement of all the necrotic nonviable tissue down to healthy bleeding viable tissue was performed with post-debridement measurements same as pre-. 3. the wound was cleansed and dry sterile dressing applied. 4. Iodine ointment provided to patient.  5. Continue using post op shoe.  6. patient is to return to clinic in 4 weeks.   Edrick Kins, DPM [facility]   Dr. Edrick Kins, [address]                                        [place], [place] [address]                Office [phone]  Fax [phone]

## 2018-11-14 IMAGING — CR DG CHEST 1V PORT
1 series · 1 of 1 positions shown · non-contrast
Comparison: 06/08/2016

CLINICAL DATA: Tachypnea with vomiting

EXAM:
PORTABLE CHEST 1 VIEW

[AP]
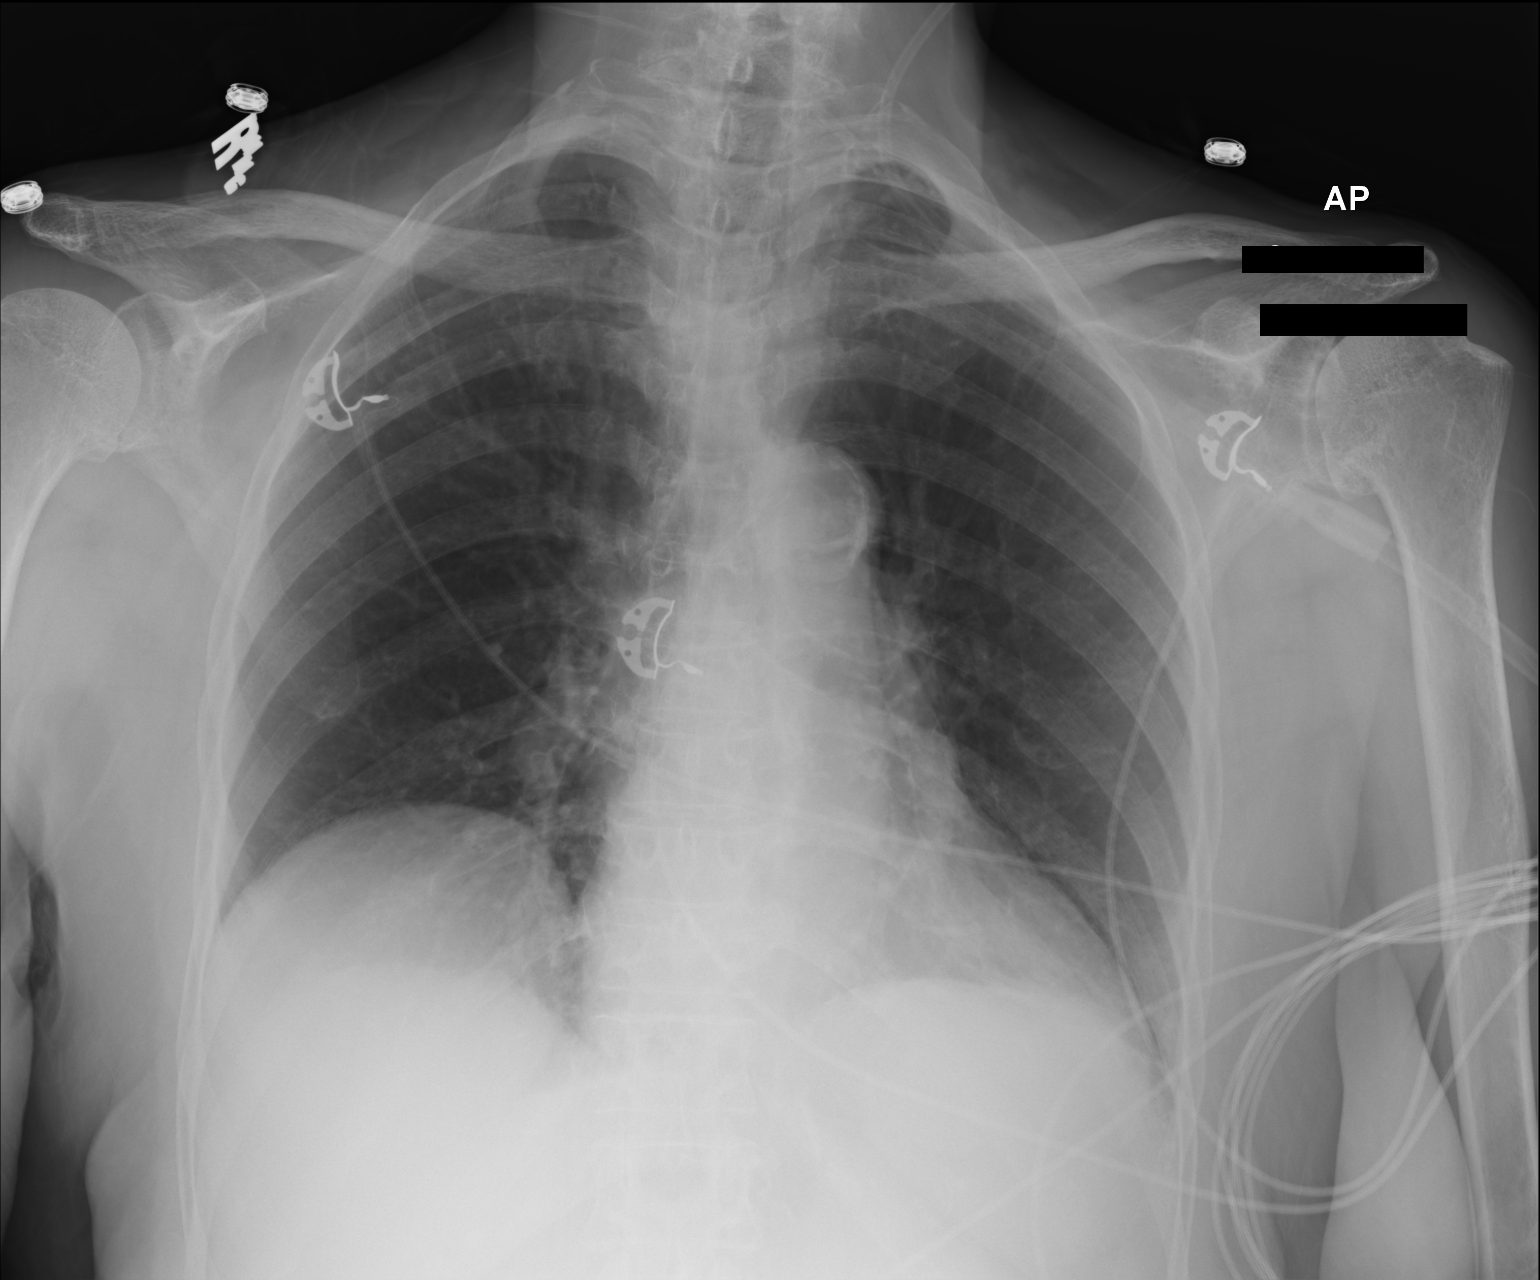

[1 of 1 positions shown; findings below may reference images not displayed]

FINDINGS: Elevation of the right diaphragm as before. Right lung is grossly
clear.

Streaky atelectasis versus mild infiltrate at the left lung base.
Mild cardiomegaly. Atherosclerosis. No pneumothorax.
IMPRESSION: Streaky atelectasis or infiltrate at the left base. Mild
cardiomegaly.

## 2018-11-15 ENCOUNTER — Other Ambulatory Visit: Payer: Self-pay | Admitting: *Deleted

## 2018-11-15 NOTE — Patient Outreach (Signed)
[facility] [facility]) Care Management  11/15/2018  HONESTI SEABERG 1923-07-12 [id number]    Telephone Assessment-HF  RN spoke with pt today and received an update on her ongoing management of care. Pt states she is doing well with no acute issues at this time. Denies any issues with GI/GU and taking her medications as prescribed. Pt continue to support from her daughter for transportation services to all her medical appointments with no missed appointments. Pt reports her weights over the last week today, yesterday and one week ago all were 142 lbs as pt continues maintain her weight. Pt denies any swelling, SOB or related symptoms. Plan of care discussed with review of her goals and interventions adjusted to the existing goal according to pt's progress. RN offered any additional tools or resources in order for her to continue managing her care at this time. Pt denies any needs. RN informed pt that RN case manager would update her provider of her disposition with [facility] services at this time (pt receptive).  Will follow up in one month with plans to transition pt to a Health Coach based upon her progress.  [name] CM Care Plan Problem One     Most Recent Value  Care Plan Problem One  Knowledge deficit related to HF  Role Documenting the Problem One  Care Management Coordinator  Care Plan for Problem One  Active  [name] Long Term Goal   Pt/fx will have an increased knowledge of HF management and what to do if acute symptoms should occurr with the next 90 days.  [name] Long Term Goal Start Date  08/07/18  Interventions for Problem One Long Term Goal  Will verify weights over the last week and that pt remains in the GREEN zone with no precipitating synptoms. Will continue to encouraged daily weights with ongoing HF monitoring to prevent acute events from occurring. Will encouraged pt to continue review of the printed material provided and inquire on pt's memory to recite HF symptoms. Will plan to inquire  that pt recite at least two HF symptoms on the next follow up call.      [name], RN Care Management Coordinator [place] Office [phone]

## 2018-11-15 NOTE — Patient Outreach (Signed)
[facility]) Care Management  11/15/2018  Shannon [name] Aug 22, 1923 [id number]    Telephone Assessment-HF  RN attempted outreach call today and spoke with pt's daughter [name] who indicated pt was having breakfast. RN offered to call later today.   Will engage further and receive an update on pt's ongoing management of care.  [name], RN Care Management Coordinator [place] Office [phone]

## 2018-11-19 IMAGING — CR DG CHEST 2V
2 series · 2 of 2 positions shown · non-contrast
Comparison: Chest x-ray 06/13/2016.

CLINICAL DATA: [AGE] female with recent history of heart
failure. Yeast in recent blood cultures.

EXAM:
CHEST  2 VIEW

[chest pa]
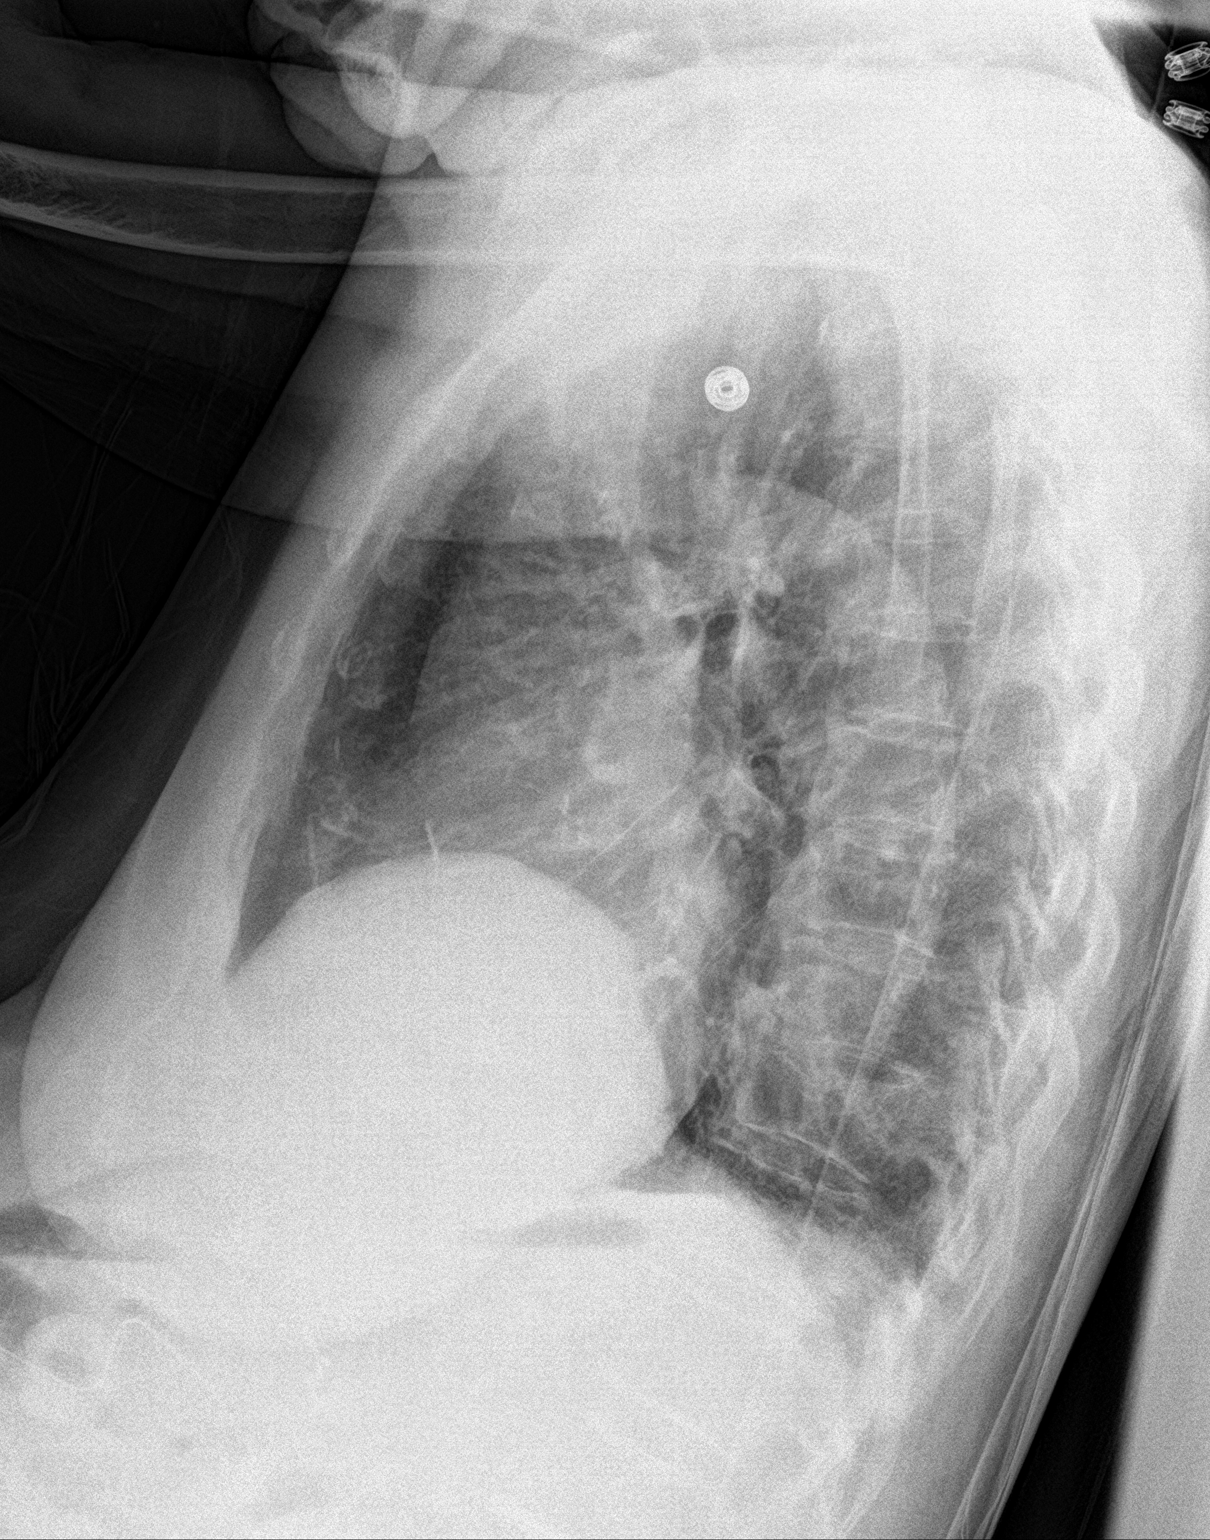

[chest ap]
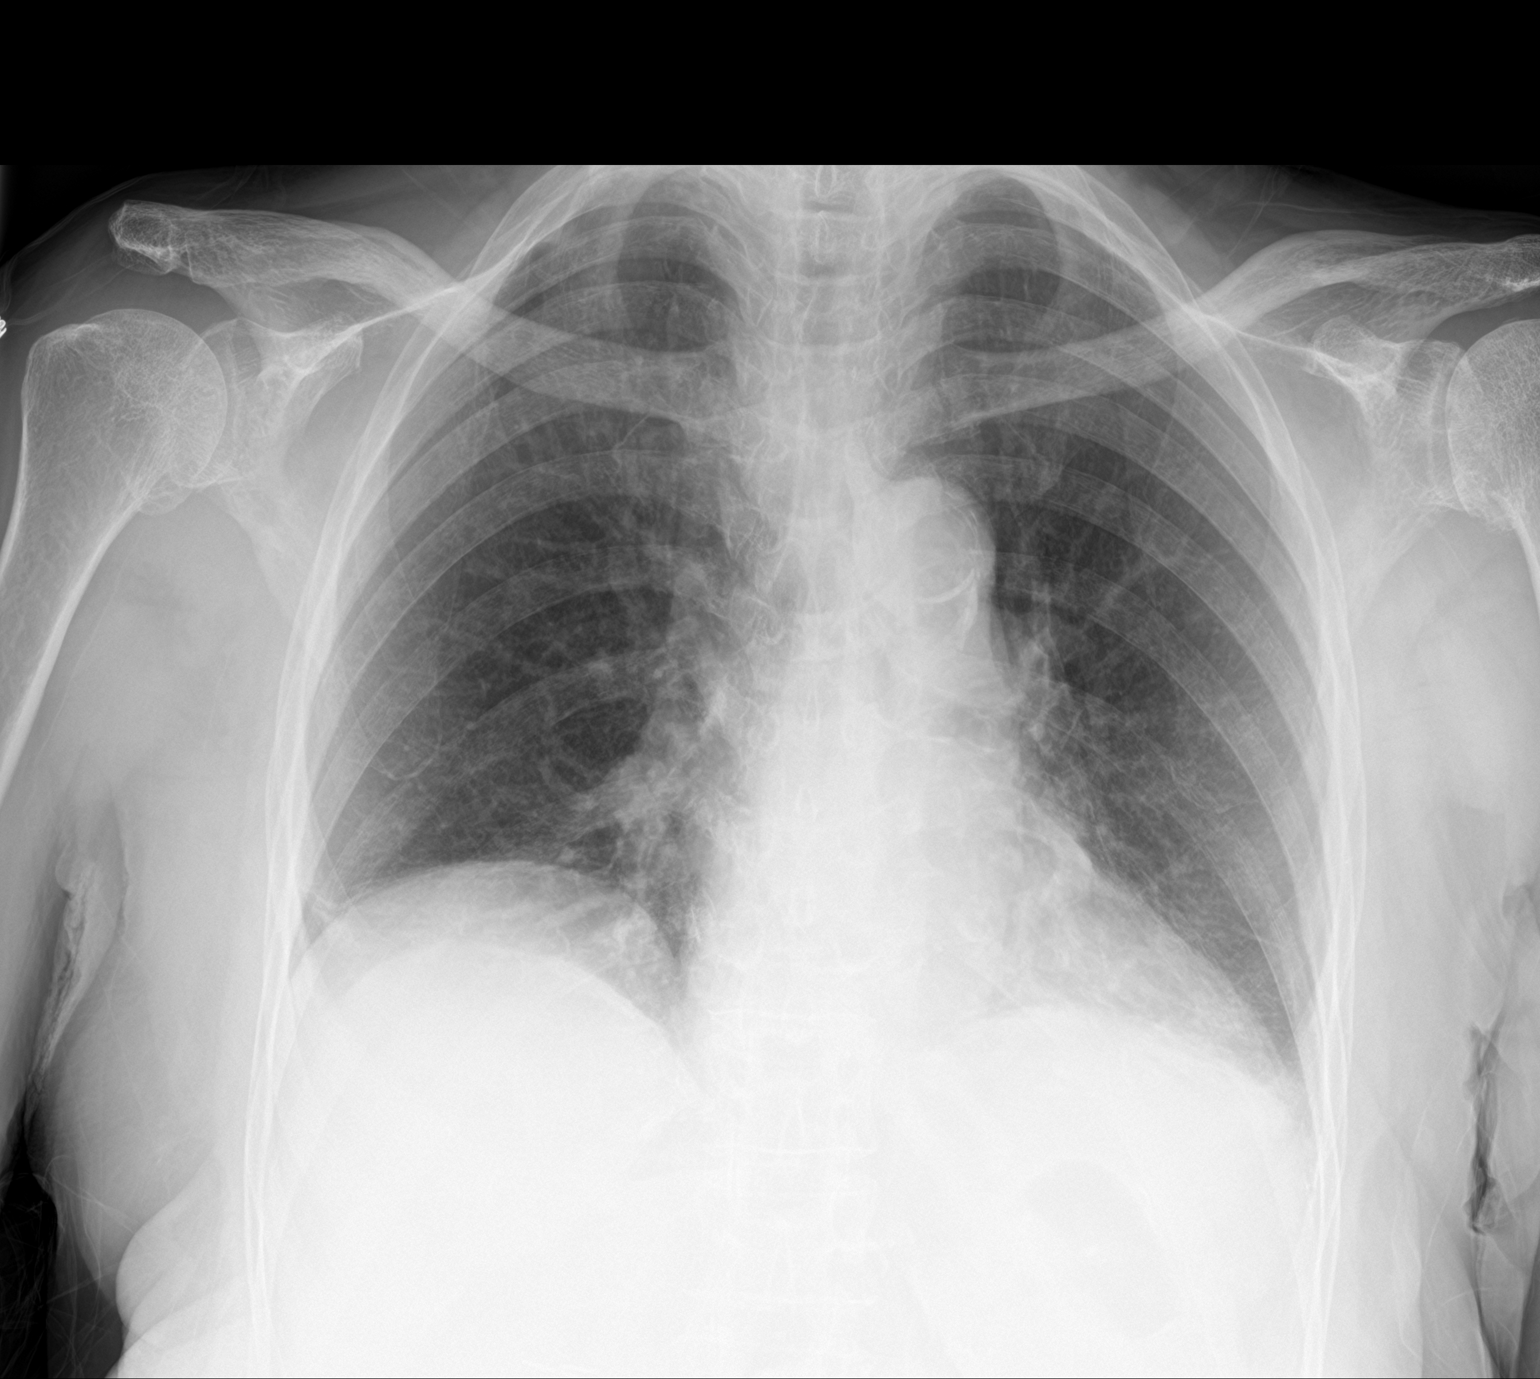

[2 of 2 positions shown; findings below may reference images not displayed]

FINDINGS: Mild diffuse peribronchial cuffing, most evident throughout the mid
to lower lungs bilaterally. Lung volumes are low. No consolidative
airspace disease. No pleural effusions. No pneumothorax. No
pulmonary nodule or mass noted. Pulmonary vasculature and the
cardiomediastinal silhouette are within normal limits. Aortic
atherosclerosis.
IMPRESSION: 1. Mild diffuse peribronchial cuffing, most evident in the mid to
lower lungs bilaterally, concerning for underlying bronchitis.
2. Aortic atherosclerosis.

## 2018-11-24 ENCOUNTER — Emergency Department (HOSPITAL_COMMUNITY): Payer: Medicare Other

## 2018-11-24 ENCOUNTER — Inpatient Hospital Stay (HOSPITAL_COMMUNITY)
Admission: EM | Admit: 2018-11-24 | Discharge: 2018-11-26 | DRG: 309 | Disposition: A | Discharge status: Home / Self Care | Payer: Medicare Other | Attending: Internal Medicine | Admitting: Internal Medicine

## 2018-11-24 ENCOUNTER — Encounter (HOSPITAL_COMMUNITY): Payer: Self-pay

## 2018-11-24 ENCOUNTER — Other Ambulatory Visit: Payer: Self-pay

## 2018-11-24 DIAGNOSIS — E039 Hypothyroidism, unspecified: Secondary | ICD-10-CM | POA: Diagnosis present

## 2018-11-24 DIAGNOSIS — M25512 Pain in left shoulder: Secondary | ICD-10-CM

## 2018-11-24 DIAGNOSIS — Z8673 Personal history of transient ischemic attack (TIA), and cerebral infarction without residual deficits: Secondary | ICD-10-CM

## 2018-11-24 DIAGNOSIS — Z8249 Family history of ischemic heart disease and other diseases of the circulatory system: Secondary | ICD-10-CM

## 2018-11-24 DIAGNOSIS — E785 Hyperlipidemia, unspecified: Secondary | ICD-10-CM | POA: Diagnosis present

## 2018-11-24 DIAGNOSIS — Z7901 Long term (current) use of anticoagulants: Secondary | ICD-10-CM

## 2018-11-24 DIAGNOSIS — S0990XA Unspecified injury of head, initial encounter: Secondary | ICD-10-CM | POA: Diagnosis not present

## 2018-11-24 DIAGNOSIS — Z7951 Long term (current) use of inhaled steroids: Secondary | ICD-10-CM

## 2018-11-24 DIAGNOSIS — Z20828 Contact with and (suspected) exposure to other viral communicable diseases: Secondary | ICD-10-CM | POA: Diagnosis not present

## 2018-11-24 DIAGNOSIS — I480 Paroxysmal atrial fibrillation: Secondary | ICD-10-CM | POA: Diagnosis present

## 2018-11-24 DIAGNOSIS — B370 Candidal stomatitis: Secondary | ICD-10-CM | POA: Diagnosis present

## 2018-11-24 DIAGNOSIS — Z931 Gastrostomy status: Secondary | ICD-10-CM

## 2018-11-24 DIAGNOSIS — Z823 Family history of stroke: Secondary | ICD-10-CM

## 2018-11-24 DIAGNOSIS — E1140 Type 2 diabetes mellitus with diabetic neuropathy, unspecified: Secondary | ICD-10-CM | POA: Diagnosis present

## 2018-11-24 DIAGNOSIS — I2510 Atherosclerotic heart disease of native coronary artery without angina pectoris: Secondary | ICD-10-CM | POA: Diagnosis present

## 2018-11-24 DIAGNOSIS — I130 Hypertensive heart and chronic kidney disease with heart failure and stage 1 through stage 4 chronic kidney disease, or unspecified chronic kidney disease: Secondary | ICD-10-CM | POA: Diagnosis not present

## 2018-11-24 DIAGNOSIS — M19012 Primary osteoarthritis, left shoulder: Secondary | ICD-10-CM | POA: Diagnosis present

## 2018-11-24 DIAGNOSIS — Z66 Do not resuscitate: Secondary | ICD-10-CM | POA: Diagnosis not present

## 2018-11-24 DIAGNOSIS — E1151 Type 2 diabetes mellitus with diabetic peripheral angiopathy without gangrene: Secondary | ICD-10-CM | POA: Diagnosis present

## 2018-11-24 DIAGNOSIS — F329 Major depressive disorder, single episode, unspecified: Secondary | ICD-10-CM | POA: Diagnosis present

## 2018-11-24 DIAGNOSIS — Z833 Family history of diabetes mellitus: Secondary | ICD-10-CM

## 2018-11-24 DIAGNOSIS — N183 Chronic kidney disease, stage 3 unspecified: Secondary | ICD-10-CM | POA: Diagnosis present

## 2018-11-24 DIAGNOSIS — D631 Anemia in chronic kidney disease: Secondary | ICD-10-CM | POA: Diagnosis present

## 2018-11-24 DIAGNOSIS — E222 Syndrome of inappropriate secretion of antidiuretic hormone: Secondary | ICD-10-CM | POA: Diagnosis present

## 2018-11-24 DIAGNOSIS — K311 Adult hypertrophic pyloric stenosis: Secondary | ICD-10-CM | POA: Diagnosis present

## 2018-11-24 DIAGNOSIS — I10 Essential (primary) hypertension: Secondary | ICD-10-CM | POA: Diagnosis present

## 2018-11-24 DIAGNOSIS — Z79890 Hormone replacement therapy (postmenopausal): Secondary | ICD-10-CM

## 2018-11-24 DIAGNOSIS — I4892 Unspecified atrial flutter: Secondary | ICD-10-CM | POA: Diagnosis present

## 2018-11-24 DIAGNOSIS — R001 Bradycardia, unspecified: Principal | ICD-10-CM | POA: Diagnosis present

## 2018-11-24 DIAGNOSIS — Z86718 Personal history of other venous thrombosis and embolism: Secondary | ICD-10-CM

## 2018-11-24 DIAGNOSIS — E1122 Type 2 diabetes mellitus with diabetic chronic kidney disease: Secondary | ICD-10-CM

## 2018-11-24 DIAGNOSIS — K219 Gastro-esophageal reflux disease without esophagitis: Secondary | ICD-10-CM | POA: Diagnosis present

## 2018-11-24 DIAGNOSIS — K449 Diaphragmatic hernia without obstruction or gangrene: Secondary | ICD-10-CM | POA: Diagnosis present

## 2018-11-24 DIAGNOSIS — Z880 Allergy status to penicillin: Secondary | ICD-10-CM

## 2018-11-24 DIAGNOSIS — J4489 Other specified chronic obstructive pulmonary disease: Secondary | ICD-10-CM | POA: Diagnosis present

## 2018-11-24 DIAGNOSIS — M329 Systemic lupus erythematosus, unspecified: Secondary | ICD-10-CM | POA: Diagnosis present

## 2018-11-24 DIAGNOSIS — I5032 Chronic diastolic (congestive) heart failure: Secondary | ICD-10-CM | POA: Diagnosis not present

## 2018-11-24 DIAGNOSIS — R0902 Hypoxemia: Secondary | ICD-10-CM | POA: Diagnosis not present

## 2018-11-24 DIAGNOSIS — R55 Syncope and collapse: Secondary | ICD-10-CM | POA: Diagnosis not present

## 2018-11-24 DIAGNOSIS — D638 Anemia in other chronic diseases classified elsewhere: Secondary | ICD-10-CM | POA: Diagnosis present

## 2018-11-24 DIAGNOSIS — Z03818 Encounter for observation for suspected exposure to other biological agents ruled out: Secondary | ICD-10-CM | POA: Diagnosis not present

## 2018-11-24 DIAGNOSIS — Z794 Long term (current) use of insulin: Secondary | ICD-10-CM

## 2018-11-24 DIAGNOSIS — R4020 Unspecified coma: Secondary | ICD-10-CM | POA: Diagnosis not present

## 2018-11-24 DIAGNOSIS — S199XXA Unspecified injury of neck, initial encounter: Secondary | ICD-10-CM | POA: Diagnosis not present

## 2018-11-24 DIAGNOSIS — J449 Chronic obstructive pulmonary disease, unspecified: Secondary | ICD-10-CM | POA: Diagnosis present

## 2018-11-24 DIAGNOSIS — I4891 Unspecified atrial fibrillation: Secondary | ICD-10-CM | POA: Diagnosis present

## 2018-11-24 LAB — CBC WITH DIFFERENTIAL/PLATELET
Abs Immature Granulocytes: 0.01 10*3/uL (ref 0.00–0.07)
Basophils Absolute: 0 10*3/uL (ref 0.0–0.1)
Basophils Relative: 1 %
Eosinophils Absolute: 0.4 10*3/uL (ref 0.0–0.5)
Eosinophils Relative: 6 %
HCT: 29.7 % — ABNORMAL LOW (ref 36.0–46.0)
Hemoglobin: 9.6 g/dL — ABNORMAL LOW (ref 12.0–15.0)
Immature Granulocytes: 0 %
Lymphocytes Relative: 16 %
Lymphs Abs: 1 10*3/uL (ref 0.7–4.0)
MCH: 31.5 pg (ref 26.0–34.0)
MCHC: 32.3 g/dL (ref 30.0–36.0)
MCV: 97.4 fL (ref 80.0–100.0)
Monocytes Absolute: 0.8 10*3/uL (ref 0.1–1.0)
Monocytes Relative: 13 %
Neutro Abs: 4.2 10*3/uL (ref 1.7–7.7)
Neutrophils Relative %: 64 %
Platelets: 201 10*3/uL (ref 150–400)
RBC: 3.05 MIL/uL — ABNORMAL LOW (ref 3.87–5.11)
RDW: 16.4 % — ABNORMAL HIGH (ref 11.5–15.5)
WBC: 6.5 10*3/uL (ref 4.0–10.5)
nRBC: 0 % (ref 0.0–0.2)

## 2018-11-24 LAB — URINALYSIS, ROUTINE W REFLEX MICROSCOPIC
Bilirubin Urine: NEGATIVE
Glucose, UA: NEGATIVE mg/dL
Hgb urine dipstick: NEGATIVE
Ketones, ur: NEGATIVE mg/dL
Leukocytes,Ua: NEGATIVE
Nitrite: NEGATIVE
Protein, ur: NEGATIVE mg/dL
Specific Gravity, Urine: 1.008 (ref 1.005–1.030)
pH: 7 (ref 5.0–8.0)

## 2018-11-24 LAB — COMPREHENSIVE METABOLIC PANEL
ALT: 20 U/L (ref 0–44)
AST: 32 U/L (ref 15–41)
Albumin: 3.8 g/dL (ref 3.5–5.0)
Alkaline Phosphatase: 71 U/L (ref 38–126)
Anion gap: 11 (ref 5–15)
BUN: 73 mg/dL — ABNORMAL HIGH (ref 8–23)
CO2: 26 mmol/L (ref 22–32)
Calcium: 9.6 mg/dL (ref 8.9–10.3)
Chloride: 99 mmol/L (ref 98–111)
Creatinine, Ser: 1.56 mg/dL — ABNORMAL HIGH (ref 0.44–1.00)
GFR calc Af Amer: 33 mL/min — ABNORMAL LOW (ref >60)
GFR calc non Af Amer: 28 mL/min — ABNORMAL LOW (ref >60)
Glucose, Bld: 86 mg/dL (ref 70–99)
Potassium: 4.4 mmol/L (ref 3.5–5.1)
Sodium: 136 mmol/L (ref 135–145)
Total Bilirubin: 0.5 mg/dL (ref 0.3–1.2)
Total Protein: 7.6 g/dL (ref 6.5–8.1)

## 2018-11-24 LAB — LACTIC ACID, PLASMA
Lactic Acid, Venous: 0.4 mmol/L — ABNORMAL LOW (ref 0.5–1.9)
Lactic Acid, Venous: 0.6 mmol/L (ref 0.5–1.9)

## 2018-11-24 LAB — TROPONIN I (HIGH SENSITIVITY)
Troponin I (High Sensitivity): 12 ng/L (ref <18)
Troponin I (High Sensitivity): 13 ng/L (ref <18)

## 2018-11-24 MED ORDER — CYCLOSPORINE 0.05 % OP EMUL
1.0000 [drp] | Freq: Two times a day (BID) | OPHTHALMIC | Status: DC
  Administered 2018-11-25 – 2018-11-26 (×4): 1 [drp] via OPHTHALMIC
  Filled 2018-11-24 (×5): qty 30

## 2018-11-24 MED ORDER — LORATADINE 10 MG PO TABS: 10.0000 mg | ORAL_TABLET | Freq: Every day | ORAL | Status: DC | PRN

## 2018-11-24 MED ORDER — FLUTICASONE PROPIONATE 50 MCG/ACT NA SUSP
2.0000 | Freq: Every day | NASAL | Status: DC
  Administered 2018-11-25 – 2018-11-26 (×2): 2 via NASAL
  Filled 2018-11-24: qty 16

## 2018-11-24 MED ORDER — LEVALBUTEROL TARTRATE 45 MCG/ACT IN AERO: 2.0000 | INHALATION_SPRAY | Freq: Four times a day (QID) | RESPIRATORY_TRACT | Status: DC | PRN

## 2018-11-24 MED ORDER — HYDRALAZINE HCL 50 MG PO TABS
50.0000 mg | ORAL_TABLET | Freq: Three times a day (TID) | ORAL | Status: DC
  Administered 2018-11-25 – 2018-11-26 (×5): 50 mg via ORAL
  Filled 2018-11-24 (×5): qty 1

## 2018-11-24 MED ORDER — INSULIN GLARGINE 100 UNIT/ML ~~LOC~~ SOLN
8.0000 [IU] | Freq: Every day | SUBCUTANEOUS | Status: DC
  Administered 2018-11-25 (×2): 8 [IU] via SUBCUTANEOUS
  Filled 2018-11-24 (×3): qty 0.08

## 2018-11-24 MED ORDER — CALCIUM CARBONATE-VITAMIN D 500-200 MG-UNIT PO TABS
1.0000 | ORAL_TABLET | Freq: Every day | ORAL | Status: DC
  Administered 2018-11-25 – 2018-11-26 (×2): 1 via ORAL
  Filled 2018-11-24 (×3): qty 1

## 2018-11-24 MED ORDER — OSMOLITE 1.2 CAL PO LIQD
59.2500 mL/h | ORAL | Status: DC
  Administered 2018-11-25: 59.25 mL/h
  Filled 2018-11-24 (×2): qty 1000

## 2018-11-24 MED ORDER — SERTRALINE HCL 100 MG PO TABS
100.0000 mg | ORAL_TABLET | Freq: Every day | ORAL | Status: DC
  Administered 2018-11-25 – 2018-11-26 (×2): 100 mg via ORAL
  Filled 2018-11-24 (×3): qty 1

## 2018-11-24 MED ORDER — GABAPENTIN 300 MG PO CAPS
300.0000 mg | ORAL_CAPSULE | Freq: Two times a day (BID) | ORAL | Status: DC
  Administered 2018-11-25 – 2018-11-26 (×4): 300 mg via ORAL
  Filled 2018-11-24 (×4): qty 1

## 2018-11-24 MED ORDER — PANTOPRAZOLE SODIUM 40 MG PO TBEC
40.0000 mg | DELAYED_RELEASE_TABLET | Freq: Two times a day (BID) | ORAL | Status: DC
  Administered 2018-11-25 – 2018-11-26 (×4): 40 mg via ORAL
  Filled 2018-11-24 (×4): qty 1

## 2018-11-24 MED ORDER — LEVOTHYROXINE SODIUM 25 MCG PO TABS
125.0000 ug | ORAL_TABLET | Freq: Every day | ORAL | Status: DC
  Administered 2018-11-25 – 2018-11-26 (×2): 125 ug via ORAL
  Filled 2018-11-24 (×3): qty 1

## 2018-11-24 MED ORDER — B COMPLEX-C PO TABS
1.0000 | ORAL_TABLET | Freq: Every day | ORAL | Status: DC
  Administered 2018-11-25 – 2018-11-26 (×2): 1 via ORAL
  Filled 2018-11-24 (×2): qty 1

## 2018-11-24 MED ORDER — INSULIN ASPART 100 UNIT/ML ~~LOC~~ SOLN
0.0000 [IU] | Freq: Three times a day (TID) | SUBCUTANEOUS | Status: DC
  Administered 2018-11-25: 1 [IU] via SUBCUTANEOUS
  Administered 2018-11-26: 2 [IU] via SUBCUTANEOUS

## 2018-11-24 MED ORDER — IPRATROPIUM-ALBUTEROL 0.5-2.5 (3) MG/3ML IN SOLN: 3.0000 mL | Freq: Four times a day (QID) | RESPIRATORY_TRACT | Status: DC | PRN

## 2018-11-24 MED ORDER — NYSTATIN 100000 UNIT/ML MT SUSP
5.0000 mL | Freq: Four times a day (QID) | OROMUCOSAL | Status: DC
  Administered 2018-11-25 – 2018-11-26 (×6): 500000 [IU] via ORAL
  Filled 2018-11-24 (×6): qty 5

## 2018-11-24 MED ORDER — ACETAMINOPHEN 650 MG RE SUPP: 650.0000 mg | Freq: Four times a day (QID) | RECTAL | Status: DC | PRN

## 2018-11-24 MED ORDER — MOMETASONE FURO-FORMOTEROL FUM 200-5 MCG/ACT IN AERO
2.0000 | INHALATION_SPRAY | Freq: Every day | RESPIRATORY_TRACT | Status: DC
  Filled 2018-11-24: qty 8.8

## 2018-11-24 MED ORDER — LATANOPROST 0.005 % OP SOLN
1.0000 [drp] | Freq: Every day | OPHTHALMIC | Status: DC
  Administered 2018-11-25 (×2): 1 [drp] via OPHTHALMIC
  Filled 2018-11-24: qty 2.5

## 2018-11-24 MED ORDER — ACETAMINOPHEN 325 MG PO TABS
650.0000 mg | ORAL_TABLET | Freq: Four times a day (QID) | ORAL | Status: DC | PRN
  Administered 2018-11-25 – 2018-11-26 (×3): 650 mg via ORAL
  Filled 2018-11-24 (×3): qty 2

## 2018-11-24 MED ORDER — SODIUM CHLORIDE 0.9% FLUSH
3.0000 mL | Freq: Two times a day (BID) | INTRAVENOUS | Status: DC
  Administered 2018-11-25 – 2018-11-26 (×4): 3 mL via INTRAVENOUS

## 2018-11-24 MED ORDER — ISOSORBIDE MONONITRATE ER 60 MG PO TB24
60.0000 mg | ORAL_TABLET | Freq: Every day | ORAL | Status: DC
  Administered 2018-11-25 – 2018-11-26 (×2): 60 mg via ORAL
  Filled 2018-11-24 (×3): qty 1

## 2018-11-24 MED ORDER — FUROSEMIDE 40 MG PO TABS
40.0000 mg | ORAL_TABLET | Freq: Every day | ORAL | Status: DC
  Administered 2018-11-25 – 2018-11-26 (×2): 40 mg via ORAL
  Filled 2018-11-24 (×3): qty 1

## 2018-11-24 MED ORDER — ATORVASTATIN CALCIUM 10 MG PO TABS
10.0000 mg | ORAL_TABLET | Freq: Every day | ORAL | Status: DC
  Administered 2018-11-25: 10 mg via ORAL
  Filled 2018-11-24 (×2): qty 1

## 2018-11-24 MED ORDER — FREE WATER
150.0000 mL | Freq: Three times a day (TID) | Status: DC
  Administered 2018-11-25 – 2018-11-26 (×3): 150 mL

## 2018-11-24 NOTE — ED Provider Notes (Signed)
[facility] DEPT Provider Note   CSN: [id number] Arrival date & time: 11/24/18  1843     History   Chief Complaint Chief Complaint  Patient presents with   Loss of Consciousness    HPI ANAIA FRITH is a 85 y.o. female.      Loss of Consciousness  Pt was seen at [facility]. Per EMS, pt's family and pt report: Pt states she was sitting "getting my hair done" by a family member when she felt "funny in the head" and then woke up on the floor. Pt describes this feeling as "lightheaded." Family states pt became unresponsive with a "weak thready pulse." Family moved pt from chair to the floor, performed chest compressions x3 and the pt became responsive. Pt states she woke up awake/alert, not confused. Denies any pain other than chronic left shoulder pain "from a fall years ago." Pt is ambulatory and active per her baseline. Denies CP/palpitations, no SOB/cough, no abd pain, no N/V/D, no neck or back pain, no focal motor weakness, no tingling/numbness in extremities.     Past Medical History:  Diagnosis Date   Acute respiratory failure ([place])    Anemia    previous blood transfusions   Arthritis    "all over"   Asthma    Atrial flutter (HCC)    Bradycardia    requiring previous d/c of BB and reduction of amiodarone   CAD (coronary artery disease)    nonobstructive per notes   Chronic diastolic CHF (congestive heart failure) ([place])    CKD (chronic kidney disease), stage III ([place])    Complication of blood transfusion    "got the wrong blood type at [facility] in ~ 2015; no adverse reaction that we are aware of"/daughter, [name] (01/27/2016)   COPD (chronic obstructive pulmonary disease) ([place])    Depression    "light case"   DVT (deep venous thrombosis) ([place]) 01/2016   a. LLE DVT 01/2016 - switched from Eliquis to Coumadin.   Dyspnea    with some activity   Gastric stenosis    a. s/p stomach tube   GERD (gastroesophageal reflux disease)     History of blood transfusion    "several" (01/27/2016)   History of stomach ulcers    Hyperlipidemia    Hypertension    Hypothyroidism    On home oxygen therapy    "2L; 9PM til 9AM" (06/27/2017)   PAD (peripheral artery disease) (HCC)    a. prior gangrene, toe amputations, intervention.   PAF (paroxysmal atrial fibrillation) ([place])    Paraesophageal hernia    Perforated gastric ulcer ([place])    Pneumonia    "a few times" (06/27/2017)   Seasonal allergies    SIADH (syndrome of inappropriate ADH production) ([place])    [name] 01/10/2015   Small bowel obstruction ([place])    "I don't know how many" (01/11/2015)   Stroke ([place])    several- no residual   Type II diabetes mellitus ([place])    "related to prednisone use  for > 20 yr; once predinose stopped; no more DM RX" (01/27/2016)   UTI (urinary tract infection) 02/08/2016   Ventral hernia with bowel obstruction     Patient Active Problem List   Diagnosis Date Noted   Amputated great toe of right foot ([place]) 05/06/2018   Peripheral vascular disease ([place]) 05/03/2018   Localized, primary osteoarthritis of shoulder region 01/21/2018   PAD (peripheral artery disease) ([place]) 06/27/2017   UTI due to extended-spectrum beta lactamase (ESBL)  producing Escherichia coli 05/04/2017   Acute renal failure superimposed on stage 3 chronic kidney disease ([place]) 93/79/0240   Complication of feeding tube ([place]) 02/20/2017   Abdominal wall cellulitis 02/20/2017   PEG tube malfunction ([place])    COPD with asthma ([place]) 10/03/2016   Chronic deep vein thrombosis (DVT) of tibial vein of left lower extremity ([place]) 10/03/2016   Gangrene of toe of left foot ([place]) 09/15/2016   Recurrent aspiration pneumonia ([place])    Atherosclerosis of native arteries of the extremities with gangrene ([place]) 07/28/2016   Calcium channel blocker adverse reaction 06/08/2016   Other specified hypothyroidism    CHF (NYHA class IV, ACC/AHA stage D) (HCC)     Shortness of breath    Pressure injury of skin 03/11/2016   PEG (percutaneous endoscopic gastrostomy) status ([place]) 03/10/2016   Encounter for therapeutic drug monitoring 02/16/2016   Warfarin anticoagulation    Chronic seasonal allergic rhinitis    Atrial fibrillation and flutter ([place]) 01/24/2016   Chronic diastolic CHF (congestive heart failure) ([place]) 01/24/2016   Bilateral leg edema 12/29/2015   Hypothyroidism due to medication    Weakness    Pyloric stenosis    Pressure ulcer 09/15/2015   Acute osteomyelitis of toe of right foot ([place])    PVD (peripheral vascular disease) ([place])    Adjustment disorder with anxiety 08/29/2015   Diabetic ulcer of toe of right foot associated with type 2 diabetes mellitus, with necrosis of muscle ([place])    Diverticulitis of large intestine without perforation or abscess without bleeding    Colitis 08/26/2015   Esophageal candidiasis (HCC)    SOB (shortness of breath)    Acute renal failure superimposed on stage 4 chronic kidney disease ([place])    Encounter for long-term (current) use of other high-risk medications    Dysphagia 07/05/2015   Asthma with allergic rhinitis 06/04/2015   Hypertensive heart disease 03/18/2015   Malnutrition of moderate degree 01/28/2015   Syncope 01/27/2015   Symptomatic bradycardia 01/21/2015   Hypotension due to drugs 01/21/2015   Protein-calorie malnutrition, severe 01/12/2015   Cerebrovascular disease 07/14/2014   Diabetic polyneuropathy associated with type 2 diabetes mellitus ([place]) 07/14/2014   Hyperlipidemia 07/14/2014   Gastric outlet obstruction 05/21/2014   Unstable gait 04/29/2014   Slurred speech    Gastroparesis    Slow transit constipation 02/03/2014   Carotid stenosis 02/03/2014   Demand ischemia ([place]) 01/21/2014   Gastric bezoar 01/20/2014   Anemia of chronic disease 01/18/2014   Chronic kidney insufficiency, stage 3 (moderate) ([place]) 01/18/2014   Depression  09/29/2013   Essential hypertension, benign 09/22/2013   GERD (gastroesophageal reflux disease) 09/22/2013   Dyslipidemia 09/22/2013   Diabetes mellitus type 2 in nonobese ([place]) 09/22/2013   Persistent atrial fibrillation 09/22/2013   History of completed stroke 09/22/2013   Diabetic neuropathy ([place]) 09/22/2013   Chronic gastrojejunal ulcer with perforation ([place]) 09/07/2013   Accelerated hypertension 09/07/2013   Systemic lupus erythematosus (SLE) inhibitor ([place]) 09/07/2013   Other specified abnormal immunological findings in serum 09/07/2013   Red blood cell antibody positive 09/07/2013    Past Surgical History:  Procedure Laterality Date   ABDOMINAL AORTOGRAM N/A 09/21/2016   Procedure: Abdominal Aortogram;  Surgeon: Waynetta Sandy, MD;  Location: [facility];  Service: Cardiovascular;  Laterality: N/A;   AMPUTATION Left 09/25/2016   Procedure: AMPUTATION DIGIT- LEFT 2ND AND 3RD TOES;  Surgeon: Rosetta Posner, MD;  Location: [facility];  Service: Vascular;  Laterality: Left;   AMPUTATION Right  05/03/2018   Procedure: AMPUTATION RIGHT GREAT TOE;  Surgeon: Rosetta Posner, MD;  Location: [facility];  Service: Vascular;  Laterality: Right;   CATARACT EXTRACTION W/ INTRAOCULAR LENS  IMPLANT, BILATERAL     CHOLECYSTECTOMY OPEN     COLECTOMY     ESOPHAGOGASTRODUODENOSCOPY N/A 01/19/2014   Procedure: ESOPHAGOGASTRODUODENOSCOPY (EGD);  Surgeon: Irene Shipper, MD;  Location: [facility];  Service: Endoscopy;  Laterality: N/A;   ESOPHAGOGASTRODUODENOSCOPY N/A 01/20/2014   Procedure: ESOPHAGOGASTRODUODENOSCOPY (EGD);  Surgeon: Irene Shipper, MD;  Location: [facility];  Service: Endoscopy;  Laterality: N/A;   ESOPHAGOGASTRODUODENOSCOPY N/A 03/19/2014   Procedure: ESOPHAGOGASTRODUODENOSCOPY (EGD);  Surgeon: Milus Banister, MD;  Location: [facility];  Service: Endoscopy;  Laterality: N/A;   ESOPHAGOGASTRODUODENOSCOPY N/A 07/08/2015   Procedure:  ESOPHAGOGASTRODUODENOSCOPY (EGD);  Surgeon: Doran Stabler, MD;  Location: [facility]COPY;  Service: Endoscopy;  Laterality: N/A;   ESOPHAGOGASTRODUODENOSCOPY (EGD) WITH PROPOFOL N/A 09/15/2015   Procedure: ESOPHAGOGASTRODUODENOSCOPY (EGD) WITH PROPOFOL;  Surgeon: Manus Gunning, MD;  Location: [facility];  Service: Gastroenterology;  Laterality: N/A;   GASTROJEJUNOSTOMY     hx/notes 01/10/2015   GASTROJEJUNOSTOMY N/A 09/23/2015   Procedure: OPEN GASTROJEJUNOSTOMY TUBE PLACEMENT;  Surgeon: Arta Bruce Kinsinger, MD;  Location: [facility];  Service: General;  Laterality: N/A;   GLAUCOMA SURGERY Bilateral    HERNIA REPAIR  2015   IR CM INJ ANY COLONIC TUBE W/FLUORO  01/05/2017   IR CM INJ ANY COLONIC TUBE W/FLUORO  06/07/2017   IR CM INJ ANY COLONIC TUBE W/FLUORO  11/05/2017   IR CM INJ ANY COLONIC TUBE W/FLUORO  09/18/2018   IR GENERIC HISTORICAL  01/07/2016   IR GJ TUBE CHANGE 01/07/2016 Jacqulynn Cadet, MD [facility]   IR GENERIC HISTORICAL  01/27/2016   IR MECH REMOV OBSTRUC MAT ANY COLON TUBE W/FLUORO 01/27/2016 Markus Daft, MD [facility]   IR GENERIC HISTORICAL  02/07/2016   IR PATIENT EVAL TECH 0-60 MINS Darrell K Allred, PA-C [facility]   IR GENERIC HISTORICAL  02/08/2016   IR GJ TUBE CHANGE 02/08/2016 Greggory Keen, MD [facility]   IR GENERIC HISTORICAL  01/06/2016   IR GJ TUBE CHANGE 01/06/2016 [facility] OUT REF   IR GENERIC HISTORICAL  05/02/2016   IR CM INJ ANY COLONIC TUBE W/FLUORO 05/02/2016 Arne Cleveland, MD [facility]   IR GENERIC HISTORICAL  05/15/2016   IR GJ TUBE CHANGE 05/15/2016 Sandi Mariscal, MD [facility]   IR GENERIC HISTORICAL  06/28/2016   IR GJ TUBE CHANGE 06/28/2016 [facility]   IR GJ TUBE CHANGE  02/20/2017   IR GJ TUBE CHANGE  05/10/2017   IR GJ TUBE CHANGE  07/06/2017   IR GJ TUBE CHANGE  08/02/2017   IR GJ TUBE CHANGE  10/15/2017   IR GJ TUBE CHANGE  12/26/2017   IR GJ TUBE CHANGE  04/08/2018   IR GJ TUBE CHANGE  06/19/2018   IR  PATIENT EVAL TECH 0-60 MINS  10/19/2016   IR PATIENT EVAL TECH 0-60 MINS  12/25/2016   IR PATIENT EVAL TECH 0-60 MINS  01/29/2017   IR PATIENT EVAL TECH 0-60 MINS  04/04/2017   IR PATIENT EVAL TECH 0-60 MINS  04/30/2017   IR PATIENT EVAL TECH 0-60 MINS  07/31/2017   IR REPLC DUODEN/JEJUNO TUBE PERCUT W/FLUORO  11/14/2016   LAPAROTOMY N/A 01/20/2015   Procedure: EXPLORATORY LAPAROTOMY;  Surgeon: Coralie Keens, MD;  Location: [facility];  Service: General;  Laterality: N/A;   LOWER EXTREMITY ANGIOGRAPHY Left  09/21/2016   Procedure: Lower Extremity Angiography;  Surgeon: Waynetta Sandy, MD;  Location: [facility];  Service: Cardiovascular;  Laterality: Left;   LOWER EXTREMITY ANGIOGRAPHY Right 06/27/2017   Procedure: Lower Extremity Angiography;  Surgeon: Waynetta Sandy, MD;  Location: [facility];  Service: Cardiovascular;  Laterality: Right;   LYSIS OF ADHESION N/A 01/20/2015   Procedure: LYSIS OF ADHESIONS < 1 HOUR;  Surgeon: Coralie Keens, MD;  Location: [facility];  Service: General;  Laterality: N/A;   PERIPHERAL VASCULAR BALLOON ANGIOPLASTY Left 09/21/2016   Procedure: Peripheral Vascular Balloon Angioplasty;  Surgeon: Waynetta Sandy, MD;  Location: [facility];  Service: Cardiovascular;  Laterality: Left;  drug coated balloon   PERIPHERAL VASCULAR BALLOON ANGIOPLASTY Right 06/27/2017   Procedure: PERIPHERAL VASCULAR BALLOON ANGIOPLASTY;  Surgeon: Waynetta Sandy, MD;  Location: [facility];  Service: Cardiovascular;  Laterality: Right;  SFA/TPTRUNK   TONSILLECTOMY     TUBAL LIGATION     VENTRAL HERNIA REPAIR  2015   incarcerated ventral hernia ([facility] 09/2013)/notes 01/10/2015     OB History   No obstetric history on file.      Home Medications    Prior to Admission medications   Medication Sig Start Date End Date Taking? Authorizing Provider  Amino Acids-Protein Hydrolys (FEEDING SUPPLEMENT, PRO-STAT SUGAR FREE  64,) LIQD Take 30 mLs by mouth 2 (two) times daily.   Yes [provider]  atorvastatin (LIPITOR) 10 MG tablet Take 1 tablet (10 mg total) by mouth daily. 09/25/18  Yes [name], NP  b complex vitamins tablet Take 1 tablet by mouth daily.    Yes [provider]  calcium-vitamin D (OSCAL WITH D) 500-200 MG-UNIT per tablet Take 1 tablet by mouth daily with breakfast.    Yes [provider]  Carboxymethylcellulose Sodium (ARTIFICIAL TEARS OP) Place 1 drop into both eyes 4 (four) times daily.   Yes [provider]  carvedilol (COREG) 6.25 MG tablet Take 1 tablet (6.25 mg total) by mouth 2 (two) times daily. 06/28/18  Yes [name], NP  collagenase (SANTYL) ointment Apply 1 application topically daily as needed (wound care).    Yes [provider]  cycloSPORINE (RESTASIS) 0.05 % ophthalmic emulsion USE 1 DROP INTO BOTH EYES TWICE DAILY Patient taking differently: Place 1 drop into both eyes 2 (two) times daily.  01/29/17  Yes Eulas Post, [place], DO  feeding supplement (OSMOLITE 1 CAL) LIQD Take 711 mLs by mouth See admin instructions. For 12 hours 2100 - 0900   Yes [provider]  ferrous sulfate 325 (65 FE) MG tablet TAKE 1 TABLET BY MOUTH EVERY DAY Patient taking differently: Take 325 mg by mouth daily.  11/01/18  Yes [name], NP  fluticasone (FLONASE) 50 MCG/ACT nasal spray SPRAY 2 SPRAYS INTO EACH NOSTRIL EVERY DAY Patient taking differently: Place 2 sprays into both nostrils daily.  10/14/18  Yes [name], NP  furosemide (LASIX) 40 MG tablet TAKE 1 TABLET BY MOUTH EVERY DAY Patient taking differently: Take 40 mg by mouth daily.  04/12/18  Yes Dorothy Spark, MD  gabapentin (NEURONTIN) 300 MG capsule TAKE 1 CAPSULE BY MOUTH TWICE A DAY Patient taking differently: Take 300 mg by mouth 2 (two) times daily.  07/26/18  Yes [name], NP  hydrALAZINE (APRESOLINE) 50 MG tablet TAKE 1 TABLET BY MOUTH THREE  TIMES A DAY Patient taking differently: Take 50 mg by mouth 3 (three) times  daily.  07/22/18  Yes Dorothy Spark, MD  insulin aspart (NOVOLOG) 100 UNIT/ML FlexPen Inject 0-6 Units into the skin 3 (three) times daily with meals. New sliding Scale If BS is 151 - 251 = 2 units,  If BS is 251 -  350 = 4 units,  If BS is 351 - 450 = 6 units Call provider if BS runs more than 400   Yes [provider]  insulin glargine (LANTUS) 100 UNIT/ML injection Inject 8 Units into the skin at bedtime.    Yes [provider]  ipratropium-albuterol (DUONEB) 0.5-2.5 (3) MG/3ML SOLN Take 3 mLs by nebulization every 6 (six) hours as needed (sob). Use 3 times daily x 4 days then every 6 hours as needed. Patient taking differently: Take 3 mLs by nebulization every 6 (six) hours as needed (sob). Use 3 times daily x 4 days then every 6 hours as needed shortness of breath. 03/21/16  Yes Eileen Stanford, PA-C  isosorbide mononitrate (IMDUR) 30 MG 24 hr tablet Take 2 tablets (60 mg total) by mouth daily. Please make annual appt with Dr. Meda Coffee for future refills. Thank you. 1st attempt. Patient taking differently: Take 60 mg by mouth daily.  10/03/18  Yes Dorothy Spark, MD  latanoprost (XALATAN) 0.005 % ophthalmic solution Place 1 drop into both eyes at bedtime. 06/02/14  Yes Blanchie Serve, MD  levalbuterol [facility] HFA) 45 MCG/ACT inhaler Inhale 2 puffs into the lungs every 6 (six) hours as needed for wheezing or shortness of breath. 02/03/16  Yes Gildardo Cranker, DO  levothyroxine (SYNTHROID, LEVOTHROID) 125 MCG tablet Take 1 tablet (125 mcg total) by mouth daily. 04/18/18  Yes [name], NP  loratadine (CLARITIN) 10 MG tablet Take 10 mg by mouth daily as needed for allergies.   Yes [provider]  mometasone-formoterol (DULERA) 200-5 MCG/ACT AERO TAKE TWO PUFFS BY MOUTH TWICE DAILY Patient taking differently: Inhale 1-2 puffs into the lungs See admin instructions. Inhale 2 puffs by mouth  in the morning and inhale 1 puff by mouth at bedtime 11/11/18  Yes [name], NP  Multiple Vitamin (MULTIVITAMIN WITH MINERALS) TABS tablet Take 1 tablet by mouth daily.   Yes [provider]  nystatin (MYCOSTATIN/NYSTOP) powder Apply 1 g topically as needed (skin irritation).    Yes [provider]  oxyCODONE (ROXICODONE) 5 MG immediate release tablet Take 1 tablet (5 mg total) by mouth every 4 (four) hours as needed for severe pain. May take 1/2 tab q4hrs as needed for mild pain. Patient taking differently: Take 2.5 mg by mouth every 4 (four) hours as needed for severe pain. May take 1/2 tab q4hrs as needed for mild pain. 06/14/18  Yes [name], NP  pantoprazole (PROTONIX) 40 MG tablet Take 1 tablet (40 mg total) by mouth 2 (two) times daily. 06/28/18  Yes [name], NP  sertraline (ZOLOFT) 100 MG tablet TAKE 1 TABLET BY MOUTH EVERY DAY FOR DEPRESSION Patient taking differently: Take 100 mg by mouth daily.  04/17/18  Yes [name], NP  warfarin (COUMADIN) 2.5 MG tablet TAKE AS DIRECTED BY COUMADIN CLINIC. Patient taking differently: Take 5-7.5 mg by mouth every evening. Take 5mg  by mouth on Monday-Saturday and then take 7.5mg  by mouth on Sunday 09/02/18  Yes Dorothy Spark, MD  Water For Irrigation, Sterile (FREE WATER) SOLN Place 150 mLs into feeding tube 3 (three) times daily.    Yes [provider]  Jay Schlichter Oil (SYSTANE  NIGHTTIME) OINT Place 1 application into both eyes at bedtime.    Yes [provider]  acetaminophen (TYLENOL) 500 MG tablet Take 500 mg by mouth every 6 (six) hours as needed (pain).     [provider]  gentamicin cream (GARAMYCIN) 0.1 % Apply 1 application topically 3 (three) times daily as needed (dry skin).     [provider]  Insulin Pen Needle (B-D UF III MINI PEN NEEDLES) 31G X 5 MM MISC Use twice daily with giving insulin injections. Dx E11.9 07/17/18   [name], NP  OXYGEN Inhale 2 L into the lungs at bedtime.    [provider]    Family History Family History  Problem Relation Age of Onset   Stroke Mother    Hypertension Mother    Diabetes Brother    Glaucoma Son    Glaucoma Son    Heart attack Neg Hx    Colon cancer Neg Hx    Stomach cancer Neg Hx     Social History Social History   Tobacco Use   Smoking status: Never Smoker   Smokeless tobacco: Former Systems developer    Types: Snuff   Tobacco comment: "used snuff in my younger days"  Substance Use Topics   Alcohol use: Never    Alcohol/week: 0.0 standard drinks    Frequency: Never   Drug use: Never     Allergies   Penicillins   Review of Systems Review of Systems  Cardiovascular: Positive for syncope.   ROS: Statement: All systems negative except as marked or noted in the HPI; Constitutional: Negative for fever and chills. ; ; Eyes: Negative for eye pain, redness and discharge. ; ; ENMT: Negative for ear pain, hoarseness, nasal congestion, sinus pressure and sore throat. ; ; Cardiovascular: Negative for chest pain, palpitations, diaphoresis, dyspnea and peripheral edema. ; ; Respiratory: Negative for cough, wheezing and stridor. ; ; Gastrointestinal: Negative for nausea, vomiting, diarrhea, abdominal pain, blood in stool, hematemesis, jaundice and rectal bleeding. . ; ; Genitourinary: Negative for dysuria, flank pain and hematuria. ; ; Musculoskeletal: Negative for back pain and neck pain. Negative for swelling and trauma.; ; Skin: Negative for pruritus, rash, abrasions, blisters, bruising and skin lesion.; ; Neuro: Negative for headache and neck stiffness. Negative for weakness, extremity weakness, paresthesias, involuntary movement, seizure and +lightheadedness, +syncope.       Physical Exam Updated Vital Signs BP (!) 179/64    Pulse 60    Temp 98.1 F (36.7 C) (Oral)    Resp 13    Ht 5\' 6"  (1.676 m)    Wt 64 kg    SpO2 100%    BMI 22.76 kg/m     Patient Vitals for the past 24 hrs:  BP Temp Temp src Pulse Resp SpO2 Height Weight  11/24/18 2230 (!) 170/73 -- -- (!) 57 11 100 % -- --  11/24/18 2200 (!) 179/64 -- -- -- 13 100 % -- --  11/24/18 2100 (!) 180/69 -- -- 60 16 100 % -- --  11/24/18 2036 (!) 178/70 -- -- 65 19 100 % -- --  11/24/18 2035 -- -- -- 70 (!) 25 100 % -- --  11/24/18 2034 (!) 179/90 -- -- 62 14 100 % -- --  11/24/18 1933 (!) 156/65 98.1 F (36.7 C) Oral 63 16 100 % 5\' 6"  (1.676 m) 64 kg  11/24/18 1914 -- -- -- -- -- 95 % -- --   20:31 Orthostatic Vital Signs LC  Orthostatic Lying   BP- Lying: 154/95Abnormal   Pulse- Lying: 63      Orthostatic Sitting  BP- Sitting: 179/90  Pulse- Sitting: 64      Orthostatic Standing at 0 minutes  BP- Standing at 0 minutes: 178/70  Pulse- Standing at 0 minutes: 70      Orthostatic Standing at 3 minutes  BP- Standing at 3 minutes: (Pt unable to stand for 3 minute blood pressure)     Physical Exam 1950: Physical examination:  Nursing notes reviewed; Vital signs and O2 SAT reviewed;  Constitutional: Well developed, Well nourished, Well hydrated, In no acute distress; Head:  Normocephalic, atraumatic; Eyes: EOMI, PERRL, No scleral icterus; ENMT: Mouth and pharynx normal, Mucous membranes moist; Neck: Supple, Full range of motion, No lymphadenopathy; Cardiovascular: Regular rate and rhythm, No gallop; Respiratory: Breath sounds clear & equal bilaterally, No wheezes.  Speaking full sentences with ease, Normal respiratory effort/excursion; Chest: Nontender, Movement normal; Abdomen: Soft, Nontender, Nondistended, Normal bowel sounds; Genitourinary: No CVA tenderness; Spine:  No midline CS, TS, LS tenderness.;;  Extremities: Peripheral pulses normal, No tenderness, +several toes amputations bilat. No edema, No calf edema or asymmetry.; Neuro: AA&Ox3, Major CN grossly intact. No facial droop. Speech clear. No gross focal motor or sensory deficits in extremities.; Skin: Color  normal, Warm, Dry.   ED Treatments / Results  Labs (all labs ordered are listed, but only abnormal results are displayed)   EKG EKG Interpretation  Date/Time:  Sunday November 24 2018 19:27:01 EDT Ventricular Rate:  63 PR Interval:    QRS Duration: 78 QT Interval:  416 QTC Calculation: 426 R Axis:   60 Text Interpretation:  Sinus rhythm When compared with ECG of 06/06/2017 No significant change was found Confirmed by Francine Graven [phone]) on 11/24/2018 8:26:34 PM   Radiology   Procedures Procedures (including critical care time)  Medications Ordered in ED Medications - No data to display   Initial Impression / Assessment and Plan / ED Course  I have reviewed the triage vital signs and the nursing notes.  Pertinent labs & imaging results that were available during my care of the patient were reviewed by me and considered in my medical decision making (see chart for details).     MDM Reviewed: previous chart, nursing note and vitals Reviewed previous: labs and ECG Interpretation: labs, ECG, x-ray and CT scan    Results for orders placed or performed during the hospital encounter of 11/24/18  Comprehensive metabolic panel  Result Value Ref Range   Sodium 136 135 - 145 mmol/L   Potassium 4.4 3.5 - 5.1 mmol/L   Chloride 99 98 - 111 mmol/L   CO2 26 22 - 32 mmol/L   Glucose, Bld 86 70 - 99 mg/dL   BUN 73 (H) 8 - 23 mg/dL   Creatinine, Ser 1.56 (H) 0.44 - 1.00 mg/dL   Calcium 9.6 8.9 - 10.3 mg/dL   Total Protein 7.6 6.5 - 8.1 g/dL   Albumin 3.8 3.5 - 5.0 g/dL   AST 32 15 - 41 U/L   ALT 20 0 - 44 U/L   Alkaline Phosphatase 71 38 - 126 U/L   Total Bilirubin 0.5 0.3 - 1.2 mg/dL   GFR calc non Af Amer 28 (L) >60 mL/min   GFR calc Af Amer 33 (L) >60 mL/min   Anion gap 11 5 - 15  CBC with Differential  Result Value Ref Range   WBC 6.5 4.0 - 10.5 K/uL   RBC 3.05 (L)  3.87 - 5.11 MIL/uL   Hemoglobin 9.6 (L) 12.0 - 15.0 g/dL   HCT 29.7 (L) 36.0 - 46.0 %   MCV 97.4  80.0 - 100.0 fL   MCH 31.5 26.0 - 34.0 pg   MCHC 32.3 30.0 - 36.0 g/dL   RDW 16.4 (H) 11.5 - 15.5 %   Platelets 201 150 - 400 K/uL   nRBC 0.0 0.0 - 0.2 %   Neutrophils Relative % 64 %   Neutro Abs 4.2 1.7 - 7.7 K/uL   Lymphocytes Relative 16 %   Lymphs Abs 1.0 0.7 - 4.0 K/uL   Monocytes Relative 13 %   Monocytes Absolute 0.8 0.1 - 1.0 K/uL   Eosinophils Relative 6 %   Eosinophils Absolute 0.4 0.0 - 0.5 K/uL   Basophils Relative 1 %   Basophils Absolute 0.0 0.0 - 0.1 K/uL   Immature Granulocytes 0 %   Abs Immature Granulocytes 0.01 0.00 - 0.07 K/uL  Lactic acid, plasma  Result Value Ref Range   Lactic Acid, Venous 0.4 (L) 0.5 - 1.9 mmol/L  Urinalysis, Routine w reflex microscopic  Result Value Ref Range   Color, Urine YELLOW YELLOW   APPearance HAZY (A) CLEAR   Specific Gravity, Urine 1.008 1.005 - 1.030   pH 7.0 5.0 - 8.0   Glucose, UA NEGATIVE NEGATIVE mg/dL   Hgb urine dipstick NEGATIVE NEGATIVE   Bilirubin Urine NEGATIVE NEGATIVE   Ketones, ur NEGATIVE NEGATIVE mg/dL   Protein, ur NEGATIVE NEGATIVE mg/dL   Nitrite NEGATIVE NEGATIVE   Leukocytes,Ua NEGATIVE NEGATIVE   RBC / HPF 0-5 0 - 5 RBC/hpf   WBC, UA 0-5 0 - 5 WBC/hpf   Bacteria, UA RARE (A) NONE SEEN   Squamous Epithelial / LPF 0-5 0 - 5   Mucus PRESENT   Troponin I (High Sensitivity)  Result Value Ref Range   Troponin I (High Sensitivity) 12 <18 ng/L   *Note: Due to a large number of results and/or encounters for the requested time period, some results have not been displayed. A complete set of results can be found in Results Review.   Dg Chest 2 View Result Date: 11/24/2018 CLINICAL DATA:  85 year old female with syncopal episode, thready pulse at that time. EXAM: CHEST - 2 VIEW COMPARISON:  06/16/2017 chest radiographs and earlier. FINDINGS: Semi upright AP and lateral views. Chronic elevation or eventration of the right hemidiaphragm. Improved lung volumes compared to 2019. Stable cardiac size and mediastinal  contours. Borderline to mild cardiomegaly. Calcified aortic atherosclerosis. Visualized tracheal air column is within normal limits. No pneumothorax, pulmonary edema, pleural effusion or consolidation. No acute osseous abnormality identified. Negative visible bowel gas pattern. Stable cholecystectomy clips. IMPRESSION: Stable radiographic appearance of the chest since 2019. No acute cardiopulmonary abnormality. Electronically Signed   By: Genevie Ann M.D.   On: 11/24/2018 20:40   Ct Head Wo Contrast Result Date: 11/24/2018 CLINICAL DATA:  Syncope, fall and hit left side of head EXAM: CT HEAD WITHOUT CONTRAST CT CERVICAL SPINE WITHOUT CONTRAST TECHNIQUE: Multidetector CT imaging of the head and cervical spine was performed following the standard protocol without intravenous contrast. Multiplanar CT image reconstructions of the cervical spine were also generated. COMPARISON:  CT 11/02/2016, CT neck 02/28/2016 FINDINGS: CT HEAD FINDINGS Brain: No acute territorial infarction, hemorrhage or intracranial mass. Chronic left cerebellar infarct. Atrophy and moderate to severe hypodensity in the white matter consistent with small vessel ischemic disease. Stable ventricle size. Vascular: No hyperdense vessels. Carotid vascular and vertebral artery calcification  Skull: Normal. Negative for fracture or focal lesion. Sinuses/Orbits: Mucosal thickening in the sinuses Other: None CT CERVICAL SPINE FINDINGS Alignment: Straightening of the cervical spine. No subluxation. Facet alignment is normal Skull base and vertebrae: Incomplete fusion posterior arch of C1. No fracture. Erosive changes involving the dens, left anterior arch of C1, and the vertebral body of C2. Prominent pannus at C1-C2 articulation. Soft tissues and spinal canal: Prominent pannus at the C1-C2 articulation. No visible canal hematoma. Prevertebral soft tissues within normal limits Disc levels: Advanced degenerative changes at C3-C4, C5-C6 and C6-C7. Multiple level  facet degenerative change. Moderate severe bilateral foraminal stenosis C3 through C7. Upper chest: Negative.  Carotid vascular calcification. Other: None IMPRESSION: 1. No CT evidence for acute intracranial abnormality. Atrophy and small vessel ischemic changes of the white matter. Chronic left cerebellar infarct 2. Straightening of the cervical spine with multiple level degenerative changes. No acute osseous abnormality 3. Erosive changes at the dens, anterior arch of C1, and C2 vertebral body with prominent pannus formation suggesting inflammatory arthropathy. Electronically Signed   By: Donavan Foil M.D.   On: 11/24/2018 20:45   Ct Cervical Spine Wo Contrast Result Date: 11/24/2018 CLINICAL DATA:  Syncope, fall and hit left side of head EXAM: CT HEAD WITHOUT CONTRAST CT CERVICAL SPINE WITHOUT CONTRAST TECHNIQUE: Multidetector CT imaging of the head and cervical spine was performed following the standard protocol without intravenous contrast. Multiplanar CT image reconstructions of the cervical spine were also generated. COMPARISON:  CT 11/02/2016, CT neck 02/28/2016 FINDINGS: CT HEAD FINDINGS Brain: No acute territorial infarction, hemorrhage or intracranial mass. Chronic left cerebellar infarct. Atrophy and moderate to severe hypodensity in the white matter consistent with small vessel ischemic disease. Stable ventricle size. Vascular: No hyperdense vessels. Carotid vascular and vertebral artery calcification Skull: Normal. Negative for fracture or focal lesion. Sinuses/Orbits: Mucosal thickening in the sinuses Other: None CT CERVICAL SPINE FINDINGS Alignment: Straightening of the cervical spine. No subluxation. Facet alignment is normal Skull base and vertebrae: Incomplete fusion posterior arch of C1. No fracture. Erosive changes involving the dens, left anterior arch of C1, and the vertebral body of C2. Prominent pannus at C1-C2 articulation. Soft tissues and spinal canal: Prominent pannus at the C1-C2  articulation. No visible canal hematoma. Prevertebral soft tissues within normal limits Disc levels: Advanced degenerative changes at C3-C4, C5-C6 and C6-C7. Multiple level facet degenerative change. Moderate severe bilateral foraminal stenosis C3 through C7. Upper chest: Negative.  Carotid vascular calcification. Other: None IMPRESSION: 1. No CT evidence for acute intracranial abnormality. Atrophy and small vessel ischemic changes of the white matter. Chronic left cerebellar infarct 2. Straightening of the cervical spine with multiple level degenerative changes. No acute osseous abnormality 3. Erosive changes at the dens, anterior arch of C1, and C2 vertebral body with prominent pannus formation suggesting inflammatory arthropathy. Electronically Signed   By: Donavan Foil M.D.   On: 11/24/2018 20:45   Dg Shoulder Left Result Date: 11/24/2018 CLINICAL DATA:  85 year old female with syncopal episode, thready pulse at that time. Left shoulder pain. EXAM: LEFT SHOULDER - 2+ VIEW COMPARISON:  Chest radiographs 09/14/2016. FINDINGS: Severe glenohumeral joint space loss appears progressed since 2018. Bone-on-bone appearance now. Chronic bulky osteophytosis. Increased subchondral sclerosis and subchondral cyst(s). No glenohumeral joint dislocation. No acute osseous abnormality identified. Negative visible left chest. IMPRESSION: No acute osseous abnormality. 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 Electronically Signed   By: Genevie Ann M.D.   On: 11/24/2018 20:42    Erilyn Pearman  Ramthun was evaluated in Emergency Department on 11/24/2018 for the symptoms described in the history of present illness. She was evaluated in the context of the global COVID-19 pandemic, which necessitated consideration that the patient might be at risk for infection with the SARS-CoV-2 virus that causes COVID-19. Institutional protocols and algorithms that pertain to the evaluation of patients at risk for COVID-19 are in a  state of rapid change based on information released by regulatory bodies including the CDC and federal and state organizations. These policies and algorithms were followed during the patient's care in the ED.   2215:  Not orthostatic on VS. Workup reassuring. HR 60's during ED stay (hx bradycardia). High risk syncope scores; will obs admit. T/C returned from [facility]. Posey Pronto, case discussed, including:  HPI, pertinent PM/SHx, VS/PE, dx testing, ED course and treatment:  Agreeable to admit.       Final Clinical Impressions(s) / ED Diagnoses   Final diagnoses:  Syncope and collapse    ED Discharge Orders    None       Francine Graven, DO 11/28/18 [id number]

## 2018-11-24 NOTE — ED Triage Notes (Signed)
Pt arrived via [facility] from home after a syncopal episode in bed while family was doing her hair. Per ems patient had become unresponsive, family felt a weak thready pulse and did three compressions and patient became responsive. Pt alert and oriented with no complaints at this time.

## 2018-11-24 NOTE — ED Notes (Signed)
Called lab about delay in bloodwork, tubes being processed now.

## 2018-11-24 NOTE — H&P (Addendum)
History and Physical    Shannon [name] ZOX:[id number] DOB: 1923-05-31 DOA: 11/24/2018  PCP: Sharon Seller, NP  Patient coming from: Home  I have personally briefly reviewed patient's old medical records in [facility]  Chief Complaint: Syncope  HPI: Shannon [name] is a 85 y.o. female with medical history significant for chronic diastolic CHF, paroxysmal atrial fibrillation on Coumadin, gastric outlet obstruction s/p gastrojejunostomy feeding tube placement, nonobstructive CAD, history of CVA, COPD, CKD stage III, insulin-dependent type 2 diabetes, hypertension, hyperlipidemia, anemia of chronic disease, hypothyroidism, and depression who presents to the ED for evaluation of a syncopal event at home.  Patient states she was sitting down in a chair at home while her granddaughter was braiding her hair.  She began to feel lightheaded and then the next she remembers is waking up on the floor.  Daughter states that patient suddenly slumped over in a chair and was unresponsive.  She was laid back in the chair and was noted to have a weak pulse.  She was given chest compressions x3 and then woke up.  Patient reports feeling occasional palpitations but denies any recent chest pain, subjective fevers, chills, diaphoresis, abdominal pain.  She reports good urine output without dysuria.  She denies any dyspnea or cough.  ED Course:  Initial vitals showed BP 156/65, pulse 61, RR 16, temp 98.1 Fahrenheit, SPO2 100% on room air.  Orthostatic vitals were negative.  Labs notable for WBC 6.5, hemoglobin 9.6, platelets 201,000, sodium 136, potassium 4.4, bicarb 26, BUN 73, creatinine 1.56, serum glucose 86.  High-sensitivity troponin 12 >> 13.  Lactic acid 0.4, 0.6.  Urinalysis negative for UTI.  SARS-CoV-2 test was obtained and pending.  CT head and cervical spine without contrast was negative for acute intracranial abnormality.  Chronic left cerebellar infarct and atrophy/small vessel ischemic  changes of the white matter noted.  Multiple level degenerative changes of the C-spine were noted.  2 view chest x-ray showed chronically elevated right hemidiaphragm and stable appearance when compared to prior without focal consolidation, effusion, or edema.  Left shoulder x-ray was negative for acute osseous abnormality.  Severe glenohumeral joint degeneration appears to have progressed since prior comparison.  The hospitalist service was consulted admit for further evaluation and management of syncope.  Review of Systems: All systems reviewed and are negative except as documented in history of present illness above.   Past Medical History:  Diagnosis Date   Acute respiratory failure (HCC)    Anemia    previous blood transfusions   Arthritis    "all over"   Asthma    Atrial flutter (HCC)    Bradycardia    requiring previous d/c of BB and reduction of amiodarone   CAD (coronary artery disease)    nonobstructive per notes   Chronic diastolic CHF (congestive heart failure) (HCC)    CKD (chronic kidney disease), stage III (HCC)    Complication of blood transfusion    "got the wrong blood type at [facility] in ~ 2015; no adverse reaction that we are aware of"/daughter, [name] (01/27/2016)   COPD (chronic obstructive pulmonary disease) (HCC)    Depression    "light case"   DVT (deep venous thrombosis) (HCC) 01/2016   a. LLE DVT 01/2016 - switched from Eliquis to Coumadin.   Dyspnea    with some activity   Gastric stenosis    a. s/p stomach tube   GERD (gastroesophageal reflux disease)    History of blood transfusion    "  several" (01/27/2016)   History of stomach ulcers    Hyperlipidemia    Hypertension    Hypothyroidism    On home oxygen therapy    "2L; 9PM til 9AM" (06/27/2017)   PAD (peripheral artery disease) (HCC)    a. prior gangrene, toe amputations, intervention.   PAF (paroxysmal atrial fibrillation) (HCC)    Paraesophageal hernia     Perforated gastric ulcer (HCC)    Pneumonia    "a few times" (06/27/2017)   Seasonal allergies    SIADH (syndrome of inappropriate ADH production) (HCC)    [name] 01/10/2015   Small bowel obstruction (HCC)    "I don't know how many" (01/11/2015)   Stroke (HCC)    several- no residual   Type II diabetes mellitus (HCC)    "related to prednisone use  for > 20 yr; once predinose stopped; no more DM RX" (01/27/2016)   UTI (urinary tract infection) 02/08/2016   Ventral hernia with bowel obstruction     Past Surgical History:  Procedure Laterality Date   ABDOMINAL AORTOGRAM N/A 09/21/2016   Procedure: Abdominal Aortogram;  Surgeon: Maeola Harman, MD;  Location: [facility] INVASIVE CV LAB;  Service: Cardiovascular;  Laterality: N/A;   AMPUTATION Left 09/25/2016   Procedure: AMPUTATION DIGIT- LEFT 2ND AND 3RD TOES;  Surgeon: Larina Earthly, MD;  Location: [facility] OR;  Service: Vascular;  Laterality: Left;   AMPUTATION Right 05/03/2018   Procedure: AMPUTATION RIGHT GREAT TOE;  Surgeon: Larina Earthly, MD;  Location: [facility] OR;  Service: Vascular;  Laterality: Right;   CATARACT EXTRACTION W/ INTRAOCULAR LENS  IMPLANT, BILATERAL     CHOLECYSTECTOMY OPEN     COLECTOMY     ESOPHAGOGASTRODUODENOSCOPY N/A 01/19/2014   Procedure: ESOPHAGOGASTRODUODENOSCOPY (EGD);  Surgeon: Hilarie Fredrickson, MD;  Location: [facility] ENDOSCOPY;  Service: Endoscopy;  Laterality: N/A;   ESOPHAGOGASTRODUODENOSCOPY N/A 01/20/2014   Procedure: ESOPHAGOGASTRODUODENOSCOPY (EGD);  Surgeon: Hilarie Fredrickson, MD;  Location: [facility] ENDOSCOPY;  Service: Endoscopy;  Laterality: N/A;   ESOPHAGOGASTRODUODENOSCOPY N/A 03/19/2014   Procedure: ESOPHAGOGASTRODUODENOSCOPY (EGD);  Surgeon: Rachael Fee, MD;  Location: [facility] ENDOSCOPY;  Service: Endoscopy;  Laterality: N/A;   ESOPHAGOGASTRODUODENOSCOPY N/A 07/08/2015   Procedure: ESOPHAGOGASTRODUODENOSCOPY (EGD);  Surgeon: Sherrilyn Rist, MD;  Location[facility] ENDOSCOPY;  Service: Endoscopy;  Laterality:  N/A;   ESOPHAGOGASTRODUODENOSCOPY (EGD) WITH PROPOFOL N/A 09/15/2015   Procedure: ESOPHAGOGASTRODUODENOSCOPY (EGD) WITH PROPOFOL;  Surgeon: Ruffin Frederick, MD;  Location: [facility] ENDOSCOPY;  Service: Gastroenterology;  Laterality: N/A;   GASTROJEJUNOSTOMY     hx/notes 01/10/2015   GASTROJEJUNOSTOMY N/A 09/23/2015   Procedure: OPEN GASTROJEJUNOSTOMY TUBE PLACEMENT;  Surgeon: De Blanch Kinsinger, MD;  Location: [facility] ORS;  Service: General;  Laterality: N/A;   GLAUCOMA SURGERY Bilateral    HERNIA REPAIR  2015   IR CM INJ ANY COLONIC TUBE W/FLUORO  01/05/2017   IR CM INJ ANY COLONIC TUBE W/FLUORO  06/07/2017   IR CM INJ ANY COLONIC TUBE W/FLUORO  11/05/2017   IR CM INJ ANY COLONIC TUBE W/FLUORO  09/18/2018   IR GENERIC HISTORICAL  01/07/2016   IR GJ TUBE CHANGE 01/07/2016 Malachy Moan, MD [facility]   IR GENERIC HISTORICAL  01/27/2016   IR MECH REMOV OBSTRUC MAT ANY COLON TUBE W/FLUORO 01/27/2016 Richarda Overlie, MD [facility]   IR GENERIC HISTORICAL  02/07/2016   IR PATIENT EVAL TECH 0-60 MINS Darrell K Allred, PA-C [facility]   IR GENERIC HISTORICAL  02/08/2016   IR GJ TUBE CHANGE 02/08/2016 Berdine Dance, MD  [facility]   IR GENERIC HISTORICAL  01/06/2016   IR GJ TUBE CHANGE 01/06/2016 [facility] OUT REF   IR GENERIC HISTORICAL  05/02/2016   IR CM INJ ANY COLONIC TUBE W/FLUORO 05/02/2016 Oley Balm, MD [facility]   IR GENERIC HISTORICAL  05/15/2016   IR GJ TUBE CHANGE 05/15/2016 Simonne Come, MD [facility]   IR GENERIC HISTORICAL  06/28/2016   IR GJ TUBE CHANGE 06/28/2016 [facility]   IR GJ TUBE CHANGE  02/20/2017   IR GJ TUBE CHANGE  05/10/2017   IR GJ TUBE CHANGE  07/06/2017   IR GJ TUBE CHANGE  08/02/2017   IR GJ TUBE CHANGE  10/15/2017   IR GJ TUBE CHANGE  12/26/2017   IR GJ TUBE CHANGE  04/08/2018   IR GJ TUBE CHANGE  06/19/2018   IR PATIENT EVAL TECH 0-60 MINS  10/19/2016   IR PATIENT EVAL TECH 0-60 MINS  12/25/2016   IR PATIENT EVAL TECH 0-60 MINS  01/29/2017     IR PATIENT EVAL TECH 0-60 MINS  04/04/2017   IR PATIENT EVAL TECH 0-60 MINS  04/30/2017   IR PATIENT EVAL TECH 0-60 MINS  07/31/2017   IR REPLC DUODEN/JEJUNO TUBE PERCUT W/FLUORO  11/14/2016   LAPAROTOMY N/A 01/20/2015   Procedure: EXPLORATORY LAPAROTOMY;  Surgeon: Abigail Miyamoto, MD;  Location: [facility];  Service: General;  Laterality: N/A;   LOWER EXTREMITY ANGIOGRAPHY Left 09/21/2016   Procedure: Lower Extremity Angiography;  Surgeon: Maeola Harman, MD;  Location: [facility] INVASIVE CV LAB;  Service: Cardiovascular;  Laterality: Left;   LOWER EXTREMITY ANGIOGRAPHY Right 06/27/2017   Procedure: Lower Extremity Angiography;  Surgeon: Maeola Harman, MD;  Location: [facility] INVASIVE CV LAB;  Service: Cardiovascular;  Laterality: Right;   LYSIS OF ADHESION N/A 01/20/2015   Procedure: LYSIS OF ADHESIONS < 1 HOUR;  Surgeon: Abigail Miyamoto, MD;  Location: [facility] OR;  Service: General;  Laterality: N/A;   PERIPHERAL VASCULAR BALLOON ANGIOPLASTY Left 09/21/2016   Procedure: Peripheral Vascular Balloon Angioplasty;  Surgeon: Maeola Harman, MD;  Location: [facility] INVASIVE CV LAB;  Service: Cardiovascular;  Laterality: Left;  drug coated balloon   PERIPHERAL VASCULAR BALLOON ANGIOPLASTY Right 06/27/2017   Procedure: PERIPHERAL VASCULAR BALLOON ANGIOPLASTY;  Surgeon: Maeola Harman, MD;  Location: [facility] INVASIVE CV LAB;  Service: Cardiovascular;  Laterality: Right;  SFA/TPTRUNK   TONSILLECTOMY     TUBAL LIGATION     VENTRAL HERNIA REPAIR  2015   incarcerated ventral hernia ([facility] 09/2013)/notes 01/10/2015    Social History:  reports that she has never smoked. She quit smokeless tobacco use about 38 years ago.  Her smokeless tobacco use included snuff. She reports that she does not drink alcohol or use drugs.  Allergies  Allergen Reactions   Penicillins Itching, Rash and Other (See Comments)    Tolerated amoxicillin, unasyn, zosyn & cephalosporins in the past. Did it  involve swelling of the face/tongue/throat, SOB, or low BP? No Did it involve sudden or severe rash/hives, skin peeling, or any reaction on the inside of your mouth or nose? No Did you need to seek medical attention at a hospital or doctor's office? Unknown When did it last happen?5+ years If all above answers are "NO", may proceed with cephalosporin use.     Family History  Problem Relation Age of Onset   Stroke Mother    Hypertension Mother    Diabetes Brother    Glaucoma Son    Glaucoma Son    Heart attack Neg  Hx    Colon cancer Neg Hx    Stomach cancer Neg Hx      Prior to Admission medications   Medication Sig Start Date End Date Taking? Authorizing Provider  Amino Acids-Protein Hydrolys (FEEDING SUPPLEMENT, PRO-STAT SUGAR FREE 64,) LIQD Take 30 mLs by mouth 2 (two) times daily.   Yes [provider]  atorvastatin (LIPITOR) 10 MG tablet Take 1 tablet (10 mg total) by mouth daily. 09/25/18  Yes Sharon Seller, NP  b complex vitamins tablet Take 1 tablet by mouth daily.    Yes [provider]  calcium-vitamin D (OSCAL WITH D) 500-200 MG-UNIT per tablet Take 1 tablet by mouth daily with breakfast.    Yes [provider]  Carboxymethylcellulose Sodium (ARTIFICIAL TEARS OP) Place 1 drop into both eyes 4 (four) times daily.   Yes [provider]  carvedilol (COREG) 6.25 MG tablet Take 1 tablet (6.25 mg total) by mouth 2 (two) times daily. 06/28/18  Yes Sharon Seller, NP  collagenase (SANTYL) ointment Apply 1 application topically daily as needed (wound care).    Yes [provider]  cycloSPORINE (RESTASIS) 0.05 % ophthalmic emulsion USE 1 DROP INTO BOTH EYES TWICE DAILY Patient taking differently: Place 1 drop into both eyes 2 (two) times daily.  01/29/17  Yes Montez Morita, Monica, DO  feeding supplement (OSMOLITE 1 CAL) LIQD Take 711 mLs by mouth See admin instructions. For 12 hours 2100 - 0900   Yes [provider]    ferrous sulfate 325 (65 FE) MG tablet TAKE 1 TABLET BY MOUTH EVERY DAY Patient taking differently: Take 325 mg by mouth daily.  11/01/18  Yes Sharon Seller, NP  fluticasone (FLONASE) 50 MCG/ACT nasal spray SPRAY 2 SPRAYS INTO EACH NOSTRIL EVERY DAY Patient taking differently: Place 2 sprays into both nostrils daily.  10/14/18  Yes Sharon Seller, NP  furosemide (LASIX) 40 MG tablet TAKE 1 TABLET BY MOUTH EVERY DAY Patient taking differently: Take 40 mg by mouth daily.  04/12/18  Yes Lars Masson, MD  gabapentin (NEURONTIN) 300 MG capsule TAKE 1 CAPSULE BY MOUTH TWICE A DAY Patient taking differently: Take 300 mg by mouth 2 (two) times daily.  07/26/18  Yes Sharon Seller, NP  hydrALAZINE (APRESOLINE) 50 MG tablet TAKE 1 TABLET BY MOUTH THREE TIMES A DAY Patient taking differently: Take 50 mg by mouth 3 (three) times daily.  07/22/18  Yes Lars Masson, MD  insulin aspart (NOVOLOG) 100 UNIT/ML FlexPen Inject 0-6 Units into the skin 3 (three) times daily with meals. New sliding Scale If BS is 151 - 251 = 2 units,  If BS is 251 -  350 = 4 units,  If BS is 351 - 450 = 6 units Call provider if BS runs more than 400   Yes [provider]  insulin glargine (LANTUS) 100 UNIT/ML injection Inject 8 Units into the skin at bedtime.    Yes [provider]  ipratropium-albuterol (DUONEB) 0.5-2.5 (3) MG/3ML SOLN Take 3 mLs by nebulization every 6 (six) hours as needed (sob). Use 3 times daily x 4 days then every 6 hours as needed. Patient taking differently: Take 3 mLs by nebulization every 6 (six) hours as needed (sob). Use 3 times daily x 4 days then every 6 hours as needed shortness of breath. 03/21/16  Yes Janetta Hora, PA-C  isosorbide mononitrate (IMDUR) 30 MG 24 hr tablet Take 2 tablets (60 mg total) by mouth daily. Please make annual  appt with Dr. Delton See for future refills. Thank you. 1st attempt. Patient taking differently: Take 60 mg by mouth daily.  10/03/18   Yes Lars Masson, MD  latanoprost (XALATAN) 0.005 % ophthalmic solution Place 1 drop into both eyes at bedtime. 06/02/14  Yes Oneal Grout, MD  levalbuterol [facility] HFA) 45 MCG/ACT inhaler Inhale 2 puffs into the lungs every 6 (six) hours as needed for wheezing or shortness of breath. 02/03/16  Yes Kirt Boys, DO  levothyroxine (SYNTHROID, LEVOTHROID) 125 MCG tablet Take 1 tablet (125 mcg total) by mouth daily. 04/18/18  Yes Sharon Seller, NP  loratadine (CLARITIN) 10 MG tablet Take 10 mg by mouth daily as needed for allergies.   Yes [provider]  mometasone-formoterol (DULERA) 200-5 MCG/ACT AERO TAKE TWO PUFFS BY MOUTH TWICE DAILY Patient taking differently: Inhale 1-2 puffs into the lungs See admin instructions. Inhale 2 puffs by mouth in the morning and inhale 1 puff by mouth at bedtime 11/11/18  Yes Sharon Seller, NP  Multiple Vitamin (MULTIVITAMIN WITH MINERALS) TABS tablet Take 1 tablet by mouth daily.   Yes [provider]  nystatin (MYCOSTATIN/NYSTOP) powder Apply 1 g topically as needed (skin irritation).    Yes [provider]  oxyCODONE (ROXICODONE) 5 MG immediate release tablet Take 1 tablet (5 mg total) by mouth every 4 (four) hours as needed for severe pain. May take 1/2 tab q4hrs as needed for mild pain. Patient taking differently: Take 2.5 mg by mouth every 4 (four) hours as needed for severe pain. May take 1/2 tab q4hrs as needed for mild pain. 06/14/18  Yes Sharon Seller, NP  pantoprazole (PROTONIX) 40 MG tablet Take 1 tablet (40 mg total) by mouth 2 (two) times daily. 06/28/18  Yes Sharon Seller, NP  sertraline (ZOLOFT) 100 MG tablet TAKE 1 TABLET BY MOUTH EVERY DAY FOR DEPRESSION Patient taking differently: Take 100 mg by mouth daily.  04/17/18  Yes Sharon Seller, NP  warfarin (COUMADIN) 2.5 MG tablet TAKE AS DIRECTED BY COUMADIN CLINIC. Patient taking differently: Take 5-7.5 mg by mouth every evening. Take 5mg  by mouth on  Monday-Saturday and then take 7.5mg  by mouth on Sunday 09/02/18  Yes Lars Masson, MD  Water For Irrigation, Sterile (FREE WATER) SOLN Place 150 mLs into feeding tube 3 (three) times daily.    Yes [provider]  White Petrolatum-Mineral Oil (SYSTANE NIGHTTIME) OINT Place 1 application into both eyes at bedtime.    Yes [provider]  acetaminophen (TYLENOL) 500 MG tablet Take 500 mg by mouth every 6 (six) hours as needed (pain).     [provider]  gentamicin cream (GARAMYCIN) 0.1 % Apply 1 application topically 3 (three) times daily as needed (dry skin).     [provider]  Insulin Pen Needle (B-D UF III MINI PEN NEEDLES) 31G X 5 MM MISC Use twice daily with giving insulin injections. Dx E11.9 07/17/18   Sharon Seller, NP  OXYGEN Inhale 2 L into the lungs at bedtime.    [provider]    Physical Exam: Vitals:   11/24/18 2200 11/24/18 2230 11/24/18 2300 11/24/18 2349  BP: (!) 179/64 (!) 170/73 (!) 160/75 (!) 170/63  Pulse:  (!) 57 (!) 58 60  Resp: 13 11 (!) 21 16  Temp:    98.2 F (36.8 C)  TempSrc:    Oral  SpO2: 100% 100% 100% 100%  Weight:    63.5 kg  Height:  5\' 6"  (1.676 m)    Constitutional: Elderly woman resting in bed with head elevated, NAD, calm, comfortable Eyes: PERRL, lids and conjunctivae normal ENMT: Thrush present on tongue.  Posterior pharynx clear of any exudate or lesions.  Neck: normal, supple, no masses. Respiratory: clear to auscultation bilaterally, no wheezing, no crackles. Normal respiratory effort. No accessory muscle use.  Cardiovascular: Borderline bradycardic, no murmurs / rubs / gallops.  Woody induration both legs.  Pedal pulses difficult to palpate.. Abdomen: Feeding tube in place left abdomen, no tenderness, no masses palpated. No hepatosplenomegaly. Bowel sounds positive.  Musculoskeletal: Status post left second and third toe amputations and right first toe amputation with chronic ulcers at  the distal tips of multiple toes.  No clubbing / cyanosis.  Good ROM, no contractures. Normal muscle tone.  Skin: no rashes, lesions, ulcers. Neurologic: CN 2-12 grossly intact. Sensation intact, Strength 5/5 in all 4.  Psychiatric: Normal judgment and insight. Alert and oriented x 3. Normal mood.     Labs on Admission: I have personally reviewed following labs and imaging studies  CBC: Recent Labs  Lab 11/24/18 2036  WBC 6.5  NEUTROABS 4.2  HGB 9.6*  HCT 29.7*  MCV 97.4  PLT 201   Basic Metabolic Panel: Recent Labs  Lab 11/24/18 2036  NA 136  K 4.4  CL 99  CO2 26  GLUCOSE 86  BUN 73*  CREATININE 1.56*  CALCIUM 9.6   GFR: Estimated Creatinine Clearance: 21.1 mL/min (A) (by C-G formula based on SCr of 1.56 mg/dL (H)). Liver Function Tests: Recent Labs  Lab 11/24/18 2036  AST 32  ALT 20  ALKPHOS 71  BILITOT 0.5  PROT 7.6  ALBUMIN 3.8   No results for input(s): LIPASE, AMYLASE in the last 168 hours. No results for input(s): AMMONIA in the last 168 hours. Coagulation Profile: No results for input(s): INR, PROTIME in the last 168 hours. Cardiac Enzymes: No results for input(s): CKTOTAL, CKMB, CKMBINDEX, TROPONINI in the last 168 hours. BNP (last 3 results) No results for input(s): PROBNP in the last 8760 hours. HbA1C: No results for input(s): HGBA1C in the last 72 hours. CBG: Recent Labs  Lab 11/25/18 0005  GLUCAP 83   Lipid Profile: No results for input(s): CHOL, HDL, LDLCALC, TRIG, CHOLHDL, LDLDIRECT in the last 72 hours. Thyroid Function Tests: No results for input(s): TSH, T4TOTAL, FREET4, T3FREE, THYROIDAB in the last 72 hours. Anemia Panel: No results for input(s): VITAMINB12, FOLATE, FERRITIN, TIBC, IRON, RETICCTPCT in the last 72 hours. Urine analysis:    Component Value Date/Time   COLORURINE YELLOW 11/24/2018 1952   APPEARANCEUR HAZY (A) 11/24/2018 1952   LABSPEC 1.008 11/24/2018 1952   PHURINE 7.0 11/24/2018 1952   GLUCOSEU NEGATIVE  11/24/2018 1952   HGBUR NEGATIVE 11/24/2018 1952   BILIRUBINUR NEGATIVE 11/24/2018 1952   BILIRUBINUR Neg 11/01/2017 1228   KETONESUR NEGATIVE 11/24/2018 1952   PROTEINUR NEGATIVE 11/24/2018 1952   UROBILINOGEN 0.2 11/01/2017 1228   UROBILINOGEN 0.2 01/27/2015 2308   NITRITE NEGATIVE 11/24/2018 1952   LEUKOCYTESUR NEGATIVE 11/24/2018 1952    Radiological Exams on Admission: Dg Chest 2 View  Result Date: 11/24/2018 CLINICAL DATA:  85 year old female with syncopal episode, thready pulse at that time. EXAM: CHEST - 2 VIEW COMPARISON:  06/16/2017 chest radiographs and earlier. FINDINGS: Semi upright AP and lateral views. Chronic elevation or eventration of the right hemidiaphragm. Improved lung volumes compared to 2019. Stable cardiac size and mediastinal contours. Borderline to mild cardiomegaly. Calcified aortic atherosclerosis. Visualized  tracheal air column is within normal limits. No pneumothorax, pulmonary edema, pleural effusion or consolidation. No acute osseous abnormality identified. Negative visible bowel gas pattern. Stable cholecystectomy clips. IMPRESSION: Stable radiographic appearance of the chest since 2019. No acute cardiopulmonary abnormality. Electronically Signed   By: Odessa Fleming M.D.   On: 11/24/2018 20:40   Ct Head Wo Contrast  Result Date: 11/24/2018 CLINICAL DATA:  Syncope, fall and hit left side of head EXAM: CT HEAD WITHOUT CONTRAST CT CERVICAL SPINE WITHOUT CONTRAST TECHNIQUE: Multidetector CT imaging of the head and cervical spine was performed following the standard protocol without intravenous contrast. Multiplanar CT image reconstructions of the cervical spine were also generated. COMPARISON:  CT 11/02/2016, CT neck 02/28/2016 FINDINGS: CT HEAD FINDINGS Brain: No acute territorial infarction, hemorrhage or intracranial mass. Chronic left cerebellar infarct. Atrophy and moderate to severe hypodensity in the white matter consistent with small vessel ischemic disease. Stable  ventricle size. Vascular: No hyperdense vessels. Carotid vascular and vertebral artery calcification Skull: Normal. Negative for fracture or focal lesion. Sinuses/Orbits: Mucosal thickening in the sinuses Other: None CT CERVICAL SPINE FINDINGS Alignment: Straightening of the cervical spine. No subluxation. Facet alignment is normal Skull base and vertebrae: Incomplete fusion posterior arch of C1. No fracture. Erosive changes involving the dens, left anterior arch of C1, and the vertebral body of C2. Prominent pannus at C1-C2 articulation. Soft tissues and spinal canal: Prominent pannus at the C1-C2 articulation. No visible canal hematoma. Prevertebral soft tissues within normal limits Disc levels: Advanced degenerative changes at C3-C4, C5-C6 and C6-C7. Multiple level facet degenerative change. Moderate severe bilateral foraminal stenosis C3 through C7. Upper chest: Negative.  Carotid vascular calcification. Other: None IMPRESSION: 1. No CT evidence for acute intracranial abnormality. Atrophy and small vessel ischemic changes of the white matter. Chronic left cerebellar infarct 2. Straightening of the cervical spine with multiple level degenerative changes. No acute osseous abnormality 3. Erosive changes at the dens, anterior arch of C1, and C2 vertebral body with prominent pannus formation suggesting inflammatory arthropathy. Electronically Signed   By: Jasmine Pang M.D.   On: 11/24/2018 20:45   Ct Cervical Spine Wo Contrast  Result Date: 11/24/2018 CLINICAL DATA:  Syncope, fall and hit left side of head EXAM: CT HEAD WITHOUT CONTRAST CT CERVICAL SPINE WITHOUT CONTRAST TECHNIQUE: Multidetector CT imaging of the head and cervical spine was performed following the standard protocol without intravenous contrast. Multiplanar CT image reconstructions of the cervical spine were also generated. COMPARISON:  CT 11/02/2016, CT neck 02/28/2016 FINDINGS: CT HEAD FINDINGS Brain: No acute territorial infarction, hemorrhage  or intracranial mass. Chronic left cerebellar infarct. Atrophy and moderate to severe hypodensity in the white matter consistent with small vessel ischemic disease. Stable ventricle size. Vascular: No hyperdense vessels. Carotid vascular and vertebral artery calcification Skull: Normal. Negative for fracture or focal lesion. Sinuses/Orbits: Mucosal thickening in the sinuses Other: None CT CERVICAL SPINE FINDINGS Alignment: Straightening of the cervical spine. No subluxation. Facet alignment is normal Skull base and vertebrae: Incomplete fusion posterior arch of C1. No fracture. Erosive changes involving the dens, left anterior arch of C1, and the vertebral body of C2. Prominent pannus at C1-C2 articulation. Soft tissues and spinal canal: Prominent pannus at the C1-C2 articulation. No visible canal hematoma. Prevertebral soft tissues within normal limits Disc levels: Advanced degenerative changes at C3-C4, C5-C6 and C6-C7. Multiple level facet degenerative change. Moderate severe bilateral foraminal stenosis C3 through C7. Upper chest: Negative.  Carotid vascular calcification. Other: None IMPRESSION: 1. No CT evidence  for acute intracranial abnormality. Atrophy and small vessel ischemic changes of the white matter. Chronic left cerebellar infarct 2. Straightening of the cervical spine with multiple level degenerative changes. No acute osseous abnormality 3. Erosive changes at the dens, anterior arch of C1, and C2 vertebral body with prominent pannus formation suggesting inflammatory arthropathy. Electronically Signed   By: Jasmine Pang M.D.   On: 11/24/2018 20:45   Dg Shoulder Left  Result Date: 11/24/2018 CLINICAL DATA:  85 year old female with syncopal episode, thready pulse at that time. Left shoulder pain. EXAM: LEFT SHOULDER - 2+ VIEW COMPARISON:  Chest radiographs 09/14/2016. FINDINGS: Severe glenohumeral joint space loss appears progressed since 2018. Bone-on-bone appearance now. Chronic bulky  osteophytosis. Increased subchondral sclerosis and subchondral cyst(s). No glenohumeral joint dislocation. No acute osseous abnormality identified. Negative visible left chest. IMPRESSION: No acute osseous abnormality. 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 Electronically Signed   By: Odessa Fleming M.D.   On: 11/24/2018 20:42    EKG: Independently reviewed.  Sinus rhythm, rate 63 bpm.  Assessment/Plan Principal Problem:   Syncope Active Problems:   Essential hypertension, benign   Diabetes mellitus type 2 in nonobese (HCC)   Hypothyroidism   Anemia of chronic disease   Chronic kidney insufficiency, stage 3 (moderate) (HCC)   Hyperlipidemia   Atrial fibrillation and flutter (HCC)   Chronic diastolic CHF (congestive heart failure) (HCC)   COPD with asthma (HCC)  Shannon [name] is a 85 y.o. female with medical history significant for chronic diastolic CHF, paroxysmal atrial fibrillation on Coumadin, gastric outlet obstruction s/p gastrojejunostomy feeding tube placement, nonobstructive CAD, history of CVA, COPD, CKD stage III, insulin-dependent type 2 diabetes, hypertension, hyperlipidemia, anemia of chronic disease, hypothyroidism, and depression who is admitted after a syncopal episode.   Syncope: Unclear etiology, suspect possible bradycardic episode.  Orthostatic vital signs were negative.  High-sensitivity troponin normal x2.  EKG with sinus rhythm with rate in the low 60s.  CT head is negative.  Patient is currently alert and oriented and back to her normal baseline per daughter at bedside. -Monitor on telemetry -Hold home Coreg -Obtain echocardiogram -PT/OT eval  Paroxysmal atrial fibrillation: In sinus rhythm with borderline bradycardia on admission.  On Coumadin with stable hemoglobin -Continue Coumadin per pharmacy -Hold home Coreg as above  Chronic diastolic CHF: EF 16-10%.  Appears compensated on admission. -Holding Coreg as above -Continue home Lasix  40 mg daily -Monitor strict I/O's, daily weights  COPD: Appears stable without active wheezing. -Continue Dulera and as needed Xopenex and DuoNebs  Insulin-dependent type 2 diabetes: Continue home Lantus 8 units and sensitive SSI.  Hypertension: Patient actually hypertensive on admission.  Orthostatic vital signs were negative. -Resume home hydralazine, Imdur -Holding Coreg as above  Gastric outlet obstruction s/p GJ feeding tube in place: Appears stable without acute issue.  Continue Osmolite and flushing with free water 3 times daily.  Patient otherwise reportedly tolerates liquid diet by mouth.  Anemia of chronic disease: Chronic and stable without obvious bleeding.  Continue to monitor on Coumadin.  CKD stage III: Appears at baseline on admission.  Continue to monitor.  History of CVA: Without residual deficit.  Continue atorvastatin and Coumadin.  PVD: S/p angioplasty right SFA and popliteal arteries and toe amputations on both feet.  Continue Coumadin and atorvastatin.  Hyperlipidemia: Continue atorvastatin.  Hypothyroidism: Continue Synthroid.  Depression: Continue sertraline.  Oral thrush: Possibly secondary to inhaled corticosteroid.  Continue oral nystatin suspension.  DVT prophylaxis: Coumadin Code Status: DNR/DNI, confirmed  with patient and daughter Family Communication: Discussed with daughter at bedside Disposition Plan: Pending clinical progress Consults called: None Admission status: Observation   Darreld Mclean MD [facility]  If 7PM-7AM, please contact night-coverage [email or web address]  11/25/2018, 12:59 AM

## 2018-11-25 ENCOUNTER — Observation Stay (HOSPITAL_COMMUNITY): Payer: Medicare Other

## 2018-11-25 ENCOUNTER — Encounter (HOSPITAL_COMMUNITY): Payer: Self-pay

## 2018-11-25 ENCOUNTER — Other Ambulatory Visit: Payer: Self-pay

## 2018-11-25 DIAGNOSIS — K219 Gastro-esophageal reflux disease without esophagitis: Secondary | ICD-10-CM | POA: Diagnosis present

## 2018-11-25 DIAGNOSIS — I10 Essential (primary) hypertension: Secondary | ICD-10-CM | POA: Diagnosis not present

## 2018-11-25 DIAGNOSIS — B370 Candidal stomatitis: Secondary | ICD-10-CM | POA: Diagnosis present

## 2018-11-25 DIAGNOSIS — E039 Hypothyroidism, unspecified: Secondary | ICD-10-CM | POA: Diagnosis not present

## 2018-11-25 DIAGNOSIS — I5032 Chronic diastolic (congestive) heart failure: Secondary | ICD-10-CM

## 2018-11-25 DIAGNOSIS — E1151 Type 2 diabetes mellitus with diabetic peripheral angiopathy without gangrene: Secondary | ICD-10-CM | POA: Diagnosis present

## 2018-11-25 DIAGNOSIS — I4892 Unspecified atrial flutter: Secondary | ICD-10-CM

## 2018-11-25 DIAGNOSIS — I4891 Unspecified atrial fibrillation: Secondary | ICD-10-CM

## 2018-11-25 DIAGNOSIS — I340 Nonrheumatic mitral (valve) insufficiency: Secondary | ICD-10-CM

## 2018-11-25 DIAGNOSIS — N183 Chronic kidney disease, stage 3 (moderate): Secondary | ICD-10-CM | POA: Diagnosis not present

## 2018-11-25 DIAGNOSIS — Z20828 Contact with and (suspected) exposure to other viral communicable diseases: Secondary | ICD-10-CM | POA: Diagnosis present

## 2018-11-25 DIAGNOSIS — J449 Chronic obstructive pulmonary disease, unspecified: Secondary | ICD-10-CM | POA: Diagnosis not present

## 2018-11-25 DIAGNOSIS — E222 Syndrome of inappropriate secretion of antidiuretic hormone: Secondary | ICD-10-CM | POA: Diagnosis present

## 2018-11-25 DIAGNOSIS — I361 Nonrheumatic tricuspid (valve) insufficiency: Secondary | ICD-10-CM | POA: Diagnosis not present

## 2018-11-25 DIAGNOSIS — Z86718 Personal history of other venous thrombosis and embolism: Secondary | ICD-10-CM | POA: Diagnosis not present

## 2018-11-25 DIAGNOSIS — D638 Anemia in other chronic diseases classified elsewhere: Secondary | ICD-10-CM

## 2018-11-25 DIAGNOSIS — E1122 Type 2 diabetes mellitus with diabetic chronic kidney disease: Secondary | ICD-10-CM | POA: Diagnosis present

## 2018-11-25 DIAGNOSIS — E785 Hyperlipidemia, unspecified: Secondary | ICD-10-CM | POA: Diagnosis not present

## 2018-11-25 DIAGNOSIS — D631 Anemia in chronic kidney disease: Secondary | ICD-10-CM | POA: Diagnosis present

## 2018-11-25 DIAGNOSIS — R55 Syncope and collapse: Secondary | ICD-10-CM | POA: Diagnosis not present

## 2018-11-25 DIAGNOSIS — E119 Type 2 diabetes mellitus without complications: Secondary | ICD-10-CM

## 2018-11-25 DIAGNOSIS — M329 Systemic lupus erythematosus, unspecified: Secondary | ICD-10-CM | POA: Diagnosis present

## 2018-11-25 DIAGNOSIS — R001 Bradycardia, unspecified: Secondary | ICD-10-CM | POA: Diagnosis not present

## 2018-11-25 DIAGNOSIS — Z7901 Long term (current) use of anticoagulants: Secondary | ICD-10-CM | POA: Diagnosis not present

## 2018-11-25 DIAGNOSIS — I2510 Atherosclerotic heart disease of native coronary artery without angina pectoris: Secondary | ICD-10-CM | POA: Diagnosis present

## 2018-11-25 DIAGNOSIS — I130 Hypertensive heart and chronic kidney disease with heart failure and stage 1 through stage 4 chronic kidney disease, or unspecified chronic kidney disease: Secondary | ICD-10-CM | POA: Diagnosis present

## 2018-11-25 DIAGNOSIS — Z66 Do not resuscitate: Secondary | ICD-10-CM | POA: Diagnosis present

## 2018-11-25 DIAGNOSIS — K449 Diaphragmatic hernia without obstruction or gangrene: Secondary | ICD-10-CM | POA: Diagnosis present

## 2018-11-25 DIAGNOSIS — I480 Paroxysmal atrial fibrillation: Secondary | ICD-10-CM | POA: Diagnosis present

## 2018-11-25 DIAGNOSIS — E1140 Type 2 diabetes mellitus with diabetic neuropathy, unspecified: Secondary | ICD-10-CM | POA: Diagnosis present

## 2018-11-25 DIAGNOSIS — K311 Adult hypertrophic pyloric stenosis: Secondary | ICD-10-CM | POA: Diagnosis present

## 2018-11-25 LAB — GLUCOSE, CAPILLARY
Glucose-Capillary: 109 mg/dL — ABNORMAL HIGH (ref 70–99)
Glucose-Capillary: 113 mg/dL — ABNORMAL HIGH (ref 70–99)
Glucose-Capillary: 121 mg/dL — ABNORMAL HIGH (ref 70–99)
Glucose-Capillary: 83 mg/dL (ref 70–99)
Glucose-Capillary: 96 mg/dL (ref 70–99)

## 2018-11-25 LAB — CBC
HCT: 27.5 % — ABNORMAL LOW (ref 36.0–46.0)
Hemoglobin: 9 g/dL — ABNORMAL LOW (ref 12.0–15.0)
MCH: 31.6 pg (ref 26.0–34.0)
MCHC: 32.7 g/dL (ref 30.0–36.0)
MCV: 96.5 fL (ref 80.0–100.0)
Platelets: 190 10*3/uL (ref 150–400)
RBC: 2.85 MIL/uL — ABNORMAL LOW (ref 3.87–5.11)
RDW: 16.4 % — ABNORMAL HIGH (ref 11.5–15.5)
WBC: 4.7 10*3/uL (ref 4.0–10.5)
nRBC: 0 % (ref 0.0–0.2)

## 2018-11-25 LAB — BASIC METABOLIC PANEL
Anion gap: 9 (ref 5–15)
BUN: 70 mg/dL — ABNORMAL HIGH (ref 8–23)
CO2: 26 mmol/L (ref 22–32)
Calcium: 9.7 mg/dL (ref 8.9–10.3)
Chloride: 105 mmol/L (ref 98–111)
Creatinine, Ser: 1.53 mg/dL — ABNORMAL HIGH (ref 0.44–1.00)
GFR calc Af Amer: 34 mL/min — ABNORMAL LOW (ref >60)
GFR calc non Af Amer: 29 mL/min — ABNORMAL LOW (ref >60)
Glucose, Bld: 106 mg/dL — ABNORMAL HIGH (ref 70–99)
Potassium: 4.2 mmol/L (ref 3.5–5.1)
Sodium: 140 mmol/L (ref 135–145)

## 2018-11-25 LAB — BRAIN NATRIURETIC PEPTIDE: B Natriuretic Peptide: 233.9 pg/mL — ABNORMAL HIGH (ref 0.0–100.0)

## 2018-11-25 LAB — PROTIME-INR
INR: 1.9 — ABNORMAL HIGH (ref 0.8–1.2)
Prothrombin Time: 21.9 seconds — ABNORMAL HIGH (ref 11.4–15.2)

## 2018-11-25 LAB — SARS CORONAVIRUS 2 (TAT 6-24 HRS): SARS Coronavirus 2: NEGATIVE

## 2018-11-25 LAB — ECHOCARDIOGRAM COMPLETE
Height: 66 in
Weight: 2240 oz

## 2018-11-25 MED ORDER — MOMETASONE FURO-FORMOTEROL FUM 200-5 MCG/ACT IN AERO
2.0000 | INHALATION_SPRAY | Freq: Every day | RESPIRATORY_TRACT | Status: DC
  Administered 2018-11-25 – 2018-11-26 (×2): 2 via RESPIRATORY_TRACT
  Filled 2018-11-25: qty 8.8

## 2018-11-25 MED ORDER — WARFARIN - PHARMACIST DOSING INPATIENT: Freq: Every day | Status: DC

## 2018-11-25 MED ORDER — MOMETASONE FURO-FORMOTEROL FUM 200-5 MCG/ACT IN AERO
1.0000 | INHALATION_SPRAY | Freq: Every day | RESPIRATORY_TRACT | Status: DC
  Administered 2018-11-25: 1 via RESPIRATORY_TRACT

## 2018-11-25 MED ORDER — OSMOLITE 1.2 CAL PO LIQD
59.2500 mL/h | Freq: Once | ORAL | Status: DC
  Filled 2018-11-25: qty 1000

## 2018-11-25 MED ORDER — WARFARIN SODIUM 5 MG PO TABS
7.5000 mg | ORAL_TABLET | Freq: Once | ORAL | Status: AC
  Administered 2018-11-25: 7.5 mg via ORAL
  Filled 2018-11-25: qty 1

## 2018-11-25 MED ORDER — WARFARIN SODIUM 5 MG PO TABS
7.5000 mg | ORAL_TABLET | Freq: Once | ORAL | Status: DC
  Filled 2018-11-25: qty 1

## 2018-11-25 NOTE — Evaluation (Signed)
Physical Therapy Evaluation Patient Details Name: Shannon [name] MRN: [id number] DOB: 1924-02-11 Today's Date: 11/25/2018   History of Present Illness  85 y.o. female with medical history significant for chronic diastolic CHF, paroxysmal atrial fibrillation on Coumadin, gastric outlet obstruction s/p gastrojejunostomy feeding tube placement, nonobstructive CAD, history of CVA, COPD, CKD stage III, insulin-dependent type 2 diabetes, hypertension, hyperlipidemia, anemia of chronic disease, hypothyroidism, depression, s/p R great toe amputation 05/04/18 who presents to the ED for evaluation of a syncopal event at home  Clinical Impression  Pt admitted with above diagnosis.  Pt currently with functional limitations due to the deficits listed below (see PT Problem List). Pt will benefit from skilled PT to increase their independence and safety with mobility to allow discharge to the venue listed below.  Pt assisted to/from bathroom and requiring min assist for steadying at this time.  Pt reports mild dizziness with mobility.  Pt states her family can assist upon d/c.     Follow Up Recommendations Home health PT;Supervision/Assistance - 24 hour    Equipment Recommendations  None recommended by PT    Recommendations for Other Services       Precautions / Restrictions Precautions Precautions: Fall Precaution Comments: G tube (L UQ)      Mobility  Bed Mobility Overal bed mobility: Needs Assistance Bed Mobility: Supine to Sit     Supine to sit: Supervision;HOB elevated     General bed mobility comments: increased time and effort  Transfers Overall transfer level: Needs assistance Equipment used: Rolling walker (2 wheeled) Transfers: Sit to/from Stand Sit to Stand: Min assist         General transfer comment: verbal cues for hand placement, assist to rise and steady  Ambulation/Gait Ambulation/Gait assistance: Min assist Gait Distance (Feet): 8 Feet(x2) Assistive device: Rolling  walker (2 wheeled) Gait Pattern/deviations: Step-through pattern;Decreased stride length Gait velocity: decr   General Gait Details: pt ambulated to/from bathroom and required min assist for steadying, pt reports slight dizziness which remained the same, vitals obtained and in docflowsheets (HR in 50s and SpO2 99% on room air)  Stairs            Wheelchair Mobility    Modified Rankin (Stroke Patients Only)       Balance Overall balance assessment: Needs assistance         Standing balance support: Bilateral upper extremity supported Standing balance-Leahy Scale: Poor Standing balance comment: requires UE support and external assist                             Pertinent Vitals/Pain Pain Assessment: No/denies pain    Home Living Family/patient expects to be discharged to:: Private residence Living Arrangements: Children Available Help at Discharge: Family;Available 24 hours/day Type of Home: House Home Access: Ramped entrance     Home Layout: One level Home Equipment: Walker - 4 wheels;Cane - single point;Hand held Careers information officer - 2 wheels;Bedside commode;Tub bench      Prior Function Level of Independence: Needs assistance   Gait / Transfers Assistance Needed: Reports she uses rollator for short ambulation distances and did not require assist.            Hand Dominance        Extremity/Trunk Assessment        Lower Extremity Assessment Lower Extremity Assessment: Generalized weakness       Communication   Communication: No difficulties  Cognition Arousal/Alertness: Awake/alert Behavior During Therapy:  WFL for tasks assessed/performed Overall Cognitive Status: Within Functional Limits for tasks assessed                                        General Comments      Exercises     Assessment/Plan    PT Assessment Patient needs continued PT services  PT Problem List Decreased strength;Decreased range of  motion;Decreased balance;Decreased knowledge of use of DME;Decreased activity tolerance;Decreased mobility       PT Treatment Interventions DME instruction;Therapeutic exercise;Gait training;Balance training;Stair training;Functional mobility training;Therapeutic activities;Patient/family education    PT Goals (Current goals can be found in the Care Plan section)  Acute Rehab PT Goals PT Goal Formulation: With patient Time For Goal Achievement: 12/09/18 Potential to Achieve Goals: Good    Frequency Min 3X/week   Barriers to discharge        Co-evaluation               AM-PAC PT "6 Clicks" Mobility  Outcome Measure Help needed turning from your back to your side while in a flat bed without using bedrails?: A Little Help needed moving from lying on your back to sitting on the side of a flat bed without using bedrails?: A Little Help needed moving to and from a bed to a chair (including a wheelchair)?: A Little Help needed standing up from a chair using your arms (e.g., wheelchair or bedside chair)?: A Little Help needed to walk in hospital room?: A Little Help needed climbing 3-5 steps with a railing? : A Lot 6 Click Score: 17    End of Session Equipment Utilized During Treatment: Gait belt Activity Tolerance: Patient tolerated treatment well Patient left: in chair;with chair alarm set;with call bell/phone within reach Nurse Communication: Mobility status PT Visit Diagnosis: Other abnormalities of gait and mobility (R26.89)    Time: 3244-0102 PT Time Calculation (min) (ACUTE ONLY): 23 min   Charges:   PT Evaluation $PT Eval Low Complexity: 1 Low     Zenovia Jarred, PT, DPT Acute Rehabilitation Services Office: [phone] Pager: [phone]  Sarajane Jews 11/25/2018, 3:35 PM

## 2018-11-25 NOTE — Evaluation (Signed)
Occupational Therapy Evaluation Patient Details Name: Shannon [name] MRN: [id number] DOB: 1923-09-10 Today's Date: 11/25/2018    History of Present Illness 85 y.o. female with medical history significant for chronic diastolic CHF, paroxysmal atrial fibrillation on Coumadin, gastric outlet obstruction s/p gastrojejunostomy feeding tube placement, nonobstructive CAD, history of CVA, COPD, CKD stage III, insulin-dependent type 2 diabetes, hypertension, hyperlipidemia, anemia of chronic disease, hypothyroidism, depression, s/p R great toe amputation 05/04/18 who presents to the ED for evaluation of a syncopal event at home   Clinical Impression   Pt admitted with syncopal episode. Pt currently with functional limitations due to the deficits listed below (see OT Problem List).  Pt will benefit from skilled OT to increase their safety and independence with ADL and functional mobility for ADL to facilitate discharge to venue listed below.      Follow Up Recommendations  Home health OT;Supervision/Assistance - 24 hour    Equipment Recommendations  None recommended by OT    Recommendations for Other Services       Precautions / Restrictions Precautions Precautions: Fall Precaution Comments: G tube (L UQ)      Mobility Bed Mobility Overal bed mobility: Needs Assistance Bed Mobility: Supine to Sit;Sit to Supine     Supine to sit: Supervision;HOB elevated Sit to supine: Min assist   General bed mobility comments: increased time and effort  Transfers Overall transfer level: Needs assistance Equipment used: Rolling walker (2 wheeled) Transfers: Sit to/from [employer] Sit to Stand: Min assist         General transfer comment: verbal cues for hand placement, assist to rise and steady    Balance Overall balance assessment: Needs assistance         Standing balance support: Bilateral upper extremity supported Standing balance-Leahy Scale: Poor Standing balance  comment: requires UE support and external assist                           ADL either performed or assessed with clinical judgement   ADL Overall ADL's : Needs assistance/impaired Eating/Feeding: Set up;Sitting   Grooming: Set up;Sitting   Upper Body Bathing: Minimal assistance;Sitting   Lower Body Bathing: Minimal assistance;Sit to/from stand;Cueing for safety;Cueing for sequencing   Upper Body Dressing : Set up;Sitting   Lower Body Dressing: Minimal assistance;Sit to/from stand;Cueing for compensatory techniques;Cueing for safety   Toilet Transfer: Min guard;BSC;RW   Toileting- Clothing Manipulation and Hygiene: Minimal assistance;Sit to/from stand;Cueing for compensatory techniques;Cueing for sequencing;Cueing for safety       Functional mobility during ADLs: Min guard;Rolling walker       Vision Baseline Vision/History: No visual deficits Patient Visual Report: No change from baseline              Pertinent Vitals/Pain Pain Assessment: No/denies pain        Extremity/Trunk Assessment Upper Extremity Assessment Upper Extremity Assessment: LUE deficits/detail LUE Deficits / Details: decreased shoulder flexion.  Pt states 'that ole arthritis'   Lower Extremity Assessment Lower Extremity Assessment: Generalized weakness       Communication Communication Communication: No difficulties   Cognition Arousal/Alertness: Awake/alert Behavior During Therapy: WFL for tasks assessed/performed Overall Cognitive Status: Within Functional Limits for tasks assessed  Home Living Family/patient expects to be discharged to:: Private residence Living Arrangements: Children Available Help at Discharge: Family;Available 24 hours/day Type of Home: House Home Access: Ramped entrance     Home Layout: One level     Bathroom Shower/Tub: Tub/shower unit;Curtain   Firefighter: Standard      Home Equipment: Environmental consultant - 4 wheels;Cane - single point;Hand held Careers information officer - 2 wheels;Bedside commode;Tub bench          Prior Functioning/Environment Level of Independence: Needs assistance  Gait / Transfers Assistance Needed: Reports she uses rollator for short ambulation distances and did not require assist.               OT Problem List: Decreased strength;Decreased activity tolerance;Impaired balance (sitting and/or standing)      OT Treatment/Interventions: Self-care/ADL training;Patient/family education;Therapeutic activities    OT Goals(Current goals can be found in the care plan section) Acute Rehab OT Goals Patient Stated Goal: home in a day or so OT Goal Formulation: With patient Potential to Achieve Goals: Good ADL Goals Pt Will Perform Grooming: with supervision;standing Pt Will Transfer to Toilet: with supervision;ambulating Pt Will Perform Toileting - Clothing Manipulation and hygiene: with supervision;sit to/from stand  OT Frequency: Min 2X/week    AM-PAC OT "6 Clicks" Daily Activity     Outcome Measure Help from another person eating meals?: None Help from another person taking care of personal grooming?: A Little Help from another person toileting, which includes using toliet, bedpan, or urinal?: A Little Help from another person bathing (including washing, rinsing, drying)?: A Little Help from another person to put on and taking off regular upper body clothing?: A Little Help from another person to put on and taking off regular lower body clothing?: A Little 6 Click Score: 19   End of Session Equipment Utilized During Treatment: Rolling walker;Gait belt Nurse Communication: Mobility status  Activity Tolerance: Patient limited by fatigue Patient left: in bed;with call bell/phone within reach;with bed alarm set  OT Visit Diagnosis: Unsteadiness on feet (R26.81);Muscle weakness (generalized) (M62.81)                Time: 3016-0109 OT Time  Calculation (min): 15 min Charges:  OT General Charges $OT Visit: 1 Visit OT Evaluation $OT Eval Moderate Complexity: 1 Mod  Lise Auer, OT Acute Rehabilitation Services [phone] Office- [phone]     Emmanuelle Hibbitts, Karin Golden D 11/25/2018, 6:21 PM

## 2018-11-25 NOTE — Progress Notes (Signed)
  Echocardiogram 2D Echocardiogram has been performed.  Disa Riedlinger G Chevy Sweigert 11/25/2018, 2:09 PM

## 2018-11-25 NOTE — Progress Notes (Signed)
ANTICOAGULATION CONSULT NOTE - Initial Consult  Pharmacy Consult for Warfarin Indication: atrial fibrillation  Allergies  Allergen Reactions  . Penicillins Itching, Rash and Other (See Comments)    Tolerated amoxicillin, unasyn, zosyn & cephalosporins in the past. Did it involve swelling of the face/tongue/throat, SOB, or low BP? No Did it involve sudden or severe rash/hives, skin peeling, or any reaction on the inside of your mouth or nose? No Did you need to seek medical attention at a hospital or doctor's office? Unknown When did it last happen?5+ years If all above answers are "NO", may proceed with cephalosporin use.     Patient Measurements: Height: 5\' 6"  (167.6 cm) Weight: 140 lb (63.5 kg) IBW/kg (Calculated) : 59.3   Vital Signs: Temp: 97.7 F (36.5 C) (08/24 0522) Temp Source: Oral (08/24 0522) BP: 152/56 (08/24 0522) Pulse Rate: 56 (08/24 0522)  Labs: Recent Labs    11/24/18 2036 11/24/18 2225 11/25/18 0530  HGB 9.6*  --  9.0*  HCT 29.7*  --  27.5*  PLT 201  --  190  LABPROT  --   --  21.9*  INR  --   --  1.9*  CREATININE 1.56*  --  1.53*  TROPONINIHS 12 13  --     Estimated Creatinine Clearance: 21.5 mL/min (A) (by C-G formula based on SCr of 1.53 mg/dL (H)).   Medical History: Past Medical History:  Diagnosis Date  . Acute respiratory failure ([place])   . Anemia    previous blood transfusions  . Arthritis    "all over"  . Asthma   . Atrial flutter ([place])   . Bradycardia    requiring previous d/c of BB and reduction of amiodarone  . CAD (coronary artery disease)    nonobstructive per notes  . Chronic diastolic CHF (congestive heart failure) ([place])   . CKD (chronic kidney disease), stage III ([place])   . Complication of blood transfusion    "got the wrong blood type at [facility] in ~ 2015; no adverse reaction that we are aware of"/daughter, [name] (01/27/2016)  . COPD (chronic obstructive pulmonary disease) ([place])   . Depression    "light  case"  . DVT (deep venous thrombosis) ([place]) 01/2016   a. LLE DVT 01/2016 - switched from Eliquis to Coumadin.  [name] Dyspnea    with some activity  . Gastric stenosis    a. s/p stomach tube  . GERD (gastroesophageal reflux disease)   . History of blood transfusion    "several" (01/27/2016)  . History of stomach ulcers   . Hyperlipidemia   . Hypertension   . Hypothyroidism   . On home oxygen therapy    "2L; 9PM til 9AM" (06/27/2017)  . PAD (peripheral artery disease) ([place])    a. prior gangrene, toe amputations, intervention.  [name] PAF (paroxysmal atrial fibrillation) ([place])   . Paraesophageal hernia   . Perforated gastric ulcer ([place])   . Pneumonia    "a few times" (06/27/2017)  . Seasonal allergies   . SIADH (syndrome of inappropriate ADH production) ([place])    [name] 01/10/2015  . Small bowel obstruction ([place])    "I don't know how many" (01/11/2015)  . Stroke [facility])    several- no residual  . Type II diabetes mellitus ([place])    "related to prednisone use  for > 20 yr; once predinose stopped; no more DM RX" (01/27/2016)  . UTI (urinary tract infection) 02/08/2016  . Ventral hernia with bowel obstruction     Medications:  Scheduled:  . atorvastatin  10 mg Oral q1800  . B-complex with vitamin C  1 tablet Oral Daily  . calcium-vitamin D  1 tablet Oral Q breakfast  . cycloSPORINE  1 drop Both Eyes BID  . feeding supplement (OSMOLITE 1.2 CAL)  59.25 mL/hr Per Tube Daily  . feeding supplement (OSMOLITE 1.2 CAL)  59.25 mL/hr Per Tube Once  . fluticasone  2 spray Each Nare Daily  . free water  150 mL Per Tube TID  . furosemide  40 mg Oral Daily  . gabapentin  300 mg Oral BID  . hydrALAZINE  50 mg Oral TID  . insulin aspart  0-9 Units Subcutaneous TID WC  . insulin glargine  8 Units Subcutaneous QHS  . isosorbide mononitrate  60 mg Oral Daily  . latanoprost  1 drop Both Eyes QHS  . levothyroxine  125 mcg Oral Daily  . mometasone-formoterol  1 puff Inhalation QHS  . mometasone-formoterol   2 puff Inhalation Daily  . nystatin  5 mL Oral QID  . pantoprazole  40 mg Oral BID  . sertraline  100 mg Oral Daily  . sodium chloride flush  3 mL Intravenous Q12H  . warfarin  7.5 mg Oral ONCE-1800  . Warfarin - Pharmacist Dosing Inpatient   Does not apply q1800   Infusions:    Assessment: 14 yoF s/p syncopal episode and x3 compressions by EMS on chronic warfarin for a-fib. HD 5 mg daily except 7.5 mg on Sun. LD 8/22 1900. INR = 1.9.   11/25/2018:  INR slightly below goal (1.9) on admission likely secondary to missed dose 8/23  CBC: Hg low at 9.0, pltc WNL  No bleeding noted.   No significant drug-drug interactions notes  Eating ~50% of full liquid diet, also has Osmolite per J-tube  Patient refused make-up warfarin dose scheduled for this morning.  Requested warfarin be timed at 6p per home schedule.   Goal of Therapy:  INR 2-3  Plan:  Warfarin 7.5 mg x1 at 6pm Daily INR  Biagio Borg 11/25/2018,12:15 PM

## 2018-11-25 NOTE — Progress Notes (Signed)
PROGRESS NOTE    Shannon [name]  ZOX:[id number] DOB: January 16, 1924 DOA: 11/24/2018 PCP: Sharon Seller, NP   Brief Narrative:  Shannon [name] is a 85 y.o. female with medical history significant for chronic diastolic CHF, paroxysmal atrial fibrillation on Coumadin, gastric outlet obstruction s/p gastrojejunostomy feeding tube placement, nonobstructive CAD, history of CVA, COPD, CKD stage III, insulin-dependent type 2 diabetes, hypertension, hyperlipidemia, anemia of chronic disease, hypothyroidism, and depression who presents to the ED for evaluation of a syncopal event at home.  Patient states she was sitting down in a chair at home while her granddaughter was braiding her hair.  She began to feel lightheaded and then the next she remembers is waking up on the floor.  Daughter states that patient suddenly slumped over in a chair and was unresponsive.  She was laid back in the chair and was noted to have a weak pulse.  She was given chest compressions x3 and then woke up.  Patient reports feeling occasional palpitations but denies any recent chest pain, subjective fevers, chills, diaphoresis, abdominal pain.  She reports good urine output without dysuria.  She denies any dyspnea or cough.   Assessment & Plan:   Principal Problem:   Syncope Active Problems:   Essential hypertension, benign   Diabetes mellitus type 2 in nonobese (HCC)   Hypothyroidism   Anemia of chronic disease   Chronic kidney insufficiency, stage 3 (moderate) (HCC)   Hyperlipidemia   Atrial fibrillation and flutter (HCC)   Chronic diastolic CHF (congestive heart failure) (HCC)   COPD with asthma (HCC)   Bradycardia   Syncope: Unclear etiology, suspect possible bradycardic episode.  Orthostatic vital signs were negative.  High-sensitivity troponin normal x2.  EKG with sinus rhythm with rate in the low 60s.  CT head is negative.  Patient is currently alert and oriented and back to her normal baseline per daughter at  bedside. -cont to monitor on telemetry -Holding home Coreg -f/up echocardiogram -PT/OT eval pending  Paroxysmal atrial fibrillation: In sinus rhythm with borderline bradycardia on admission.  On Coumadin with stable hemoglobin -Continue Coumadin per pharmacy -Holding home Coreg as above  Chronic diastolic CHF: EF 81-19%.  Appears compensated on admission. -Holding Coreg as above -Continue home Lasix 40 mg daily -Monitor strict I/O's, daily weights  COPD: Appears stable without active wheezing. -Continue Dulera and as needed Xopenex and DuoNebs  Insulin-dependent type 2 diabetes: Continue home Lantus 8 units and sensitive SSI.  Hypertension: Patient actually hypertensive on admission.  Orthostatic vital signs were negative. -Resume home hydralazine, Imdur -Holding Coreg as above  Gastric outlet obstruction s/p GJ feeding tube in place: Appears stable without acute issue.  Continue Osmolite and flushing with free water 3 times daily.  Patient otherwise reportedly tolerates liquid diet by mouth.  Anemia of chronic disease: Chronic and stable without obvious bleeding.  Continue to monitor on Coumadin.  CKD stage III: Appears at baseline on admission.  Continue to monitor.  History of CVA: Without residual deficit.  Continue atorvastatin and Coumadin.  PVD: S/p angioplasty right SFA and popliteal arteries and toe amputations on both feet.  Continue Coumadin and atorvastatin.  Hyperlipidemia: Continue atorvastatin.  Hypothyroidism: Continue Synthroid.  Depression: Continue sertraline.  Oral thrush: Possibly secondary to inhaled corticosteroid.  Continue oral nystatin suspension.  DVT prophylaxis: [id number]  Code Status: dnr    Code Status Orders  (From admission, onward)         Start     Ordered   11/24/18 2350  Do not attempt resuscitation (DNR)  Continuous    Question Answer Comment  In the event of cardiac or respiratory ARREST Do not  call a code blue   In the event of cardiac or respiratory ARREST Do not perform Intubation, CPR, defibrillation or ACLS   In the event of cardiac or respiratory ARREST Use medication by any route, position, wound care, and other measures to relive pain and suffering. May use oxygen, suction and manual treatment of airway obstruction as needed for comfort.      11/24/18 2352        Code Status History    Date Active Date Inactive Code Status Order ID Comments User Context   05/03/2018 1535 05/09/2018 1920 Full Code [id number]  Carron Curie, MD Inpatient   06/27/2017 1507 06/29/2017 1553 Full Code [id number]  Maeola Harman, MD Inpatient   02/20/2017 0434 02/23/2017 1956 DNR [id number]  Briscoe Deutscher, MD ED   09/15/2016 0035 09/27/2016 1742 DNR [id number]  Hillary Bow, DO ED   08/12/2016 1706 08/18/2016 2132 DNR [id number]  Dorothea Ogle, MD Inpatient   07/28/2016 2325 08/01/2016 2110 DNR [id number]  Michael Litter, MD ED   06/08/2016 1752 06/16/2016 1805 DNR [id number]  Tobey Grim, NP ED   05/22/2016 0456 05/30/2016 1738 DNR [id number]  Clydie Braun, MD Inpatient   05/09/2016 1049 05/17/2016 1728 DNR [id number]  Maxie Barb, MD Inpatient   05/08/2016 1759 05/09/2016 1049 Full Code [id number]  Maxie Barb, MD ED   03/02/2016 1755 03/14/2016 2016 Partial Code [id number]  Ellsworth Lennox, PA Inpatient   02/08/2016 0217 02/12/2016 1746 Partial Code [id number]  Clydie Braun, MD ED   01/24/2016 1621 02/02/2016 2056 Partial Code [id number]  Laurann Montana, PA-C Inpatient   01/24/2016 1611 01/24/2016 1621 Partial Code [id number]  Dyann Kief, PA-C Inpatient   01/24/2016 1611 01/24/2016 1611 Partial Code [id number]  Dyann Kief, PA-C Inpatient   11/06/2015 2300 11/11/2015 2201 Partial Code [id number]  Alberteen Sam, MD Inpatient   09/12/2015 0411 09/30/2015 1719 Partial Code [id number]  Briscoe Deutscher, MD Inpatient   08/26/2015 1918 09/05/2015 1824 Partial Code [id number]   Meredith Pel, NP Inpatient   08/26/2015 1527 08/26/2015 1918 Full Code [id number]  Meredith Pel, NP ED   08/02/2015 1603 08/05/2015 1731 Partial Code [id number]  Abelino Derrick, PA-C ED   07/06/2015 1144 07/11/2015 1636 Partial Code [id number]  Minor, Vilinda Blanks, NP Inpatient   07/05/2015 2118 07/06/2015 1144 Full Code [id number]  Hillary Bow, DO ED   01/28/2015 0047 01/29/2015 1732 DNR [id number]  Rolly Salter, MD Inpatient   01/14/2015 1409 01/27/2015 2148 DNR [id number]  Katheran Awe, NP Inpatient   01/10/2015 0501 01/14/2015 1409 Full Code [id number]  Hillary Bow, DO ED   05/21/2014 2324 05/30/2014 1829 DNR [id number]  Hillary Bow, DO ED   03/27/2014 1733 03/30/2014 1826 DNR [id number]  Huey Bienenstock, MD Inpatient   03/13/2014 0226 03/26/2014 1631 Full Code [id number]  Eduard Clos, MD Inpatient   01/18/2014 0150 01/22/2014 1708 Full Code [id number]  Pearson Grippe, MD Inpatient   12/23/2013 2311 12/26/2013 2218 Full Code [id number]  Haydee Monica, MD Inpatient   Advance Care Planning Activity     Family Communication: Discussed with patient's daughter Disposition Plan:   Patient be changed to inpatient for continued monitoring of bradycardia with symptoms, they will further evaluation and treatment patient at risk of clinical deterioration that could  be life-threatening Consults called: None Admission status: Inpatient   Consultants:   None  Procedures:  Dg Chest 2 View  Result Date: 11/24/2018 CLINICAL DATA:  85 year old female with syncopal episode, thready pulse at that time. EXAM: CHEST - 2 VIEW COMPARISON:  06/16/2017 chest radiographs and earlier. FINDINGS: Semi upright AP and lateral views. Chronic elevation or eventration of the right hemidiaphragm. Improved lung volumes compared to 2019. Stable cardiac size and mediastinal contours. Borderline to mild cardiomegaly. Calcified aortic atherosclerosis. Visualized tracheal air column is within normal limits. No  pneumothorax, pulmonary edema, pleural effusion or consolidation. No acute osseous abnormality identified. Negative visible bowel gas pattern. Stable cholecystectomy clips. IMPRESSION: Stable radiographic appearance of the chest since 2019. No acute cardiopulmonary abnormality. Electronically Signed   By: Odessa Fleming M.D.   On: 11/24/2018 20:40   Ct Head Wo Contrast  Result Date: 11/24/2018 CLINICAL DATA:  Syncope, fall and hit left side of head EXAM: CT HEAD WITHOUT CONTRAST CT CERVICAL SPINE WITHOUT CONTRAST TECHNIQUE: Multidetector CT imaging of the head and cervical spine was performed following the standard protocol without intravenous contrast. Multiplanar CT image reconstructions of the cervical spine were also generated. COMPARISON:  CT 11/02/2016, CT neck 02/28/2016 FINDINGS: CT HEAD FINDINGS Brain: No acute territorial infarction, hemorrhage or intracranial mass. Chronic left cerebellar infarct. Atrophy and moderate to severe hypodensity in the white matter consistent with small vessel ischemic disease. Stable ventricle size. Vascular: No hyperdense vessels. Carotid vascular and vertebral artery calcification Skull: Normal. Negative for fracture or focal lesion. Sinuses/Orbits: Mucosal thickening in the sinuses Other: None CT CERVICAL SPINE FINDINGS Alignment: Straightening of the cervical spine. No subluxation. Facet alignment is normal Skull base and vertebrae: Incomplete fusion posterior arch of C1. No fracture. Erosive changes involving the dens, left anterior arch of C1, and the vertebral body of C2. Prominent pannus at C1-C2 articulation. Soft tissues and spinal canal: Prominent pannus at the C1-C2 articulation. No visible canal hematoma. Prevertebral soft tissues within normal limits Disc levels: Advanced degenerative changes at C3-C4, C5-C6 and C6-C7. Multiple level facet degenerative change. Moderate severe bilateral foraminal stenosis C3 through C7. Upper chest: Negative.  Carotid vascular  calcification. Other: None IMPRESSION: 1. No CT evidence for acute intracranial abnormality. Atrophy and small vessel ischemic changes of the white matter. Chronic left cerebellar infarct 2. Straightening of the cervical spine with multiple level degenerative changes. No acute osseous abnormality 3. Erosive changes at the dens, anterior arch of C1, and C2 vertebral body with prominent pannus formation suggesting inflammatory arthropathy. Electronically Signed   By: Jasmine Pang M.D.   On: 11/24/2018 20:45   Ct Cervical Spine Wo Contrast  Result Date: 11/24/2018 CLINICAL DATA:  Syncope, fall and hit left side of head EXAM: CT HEAD WITHOUT CONTRAST CT CERVICAL SPINE WITHOUT CONTRAST TECHNIQUE: Multidetector CT imaging of the head and cervical spine was performed following the standard protocol without intravenous contrast. Multiplanar CT image reconstructions of the cervical spine were also generated. COMPARISON:  CT 11/02/2016, CT neck 02/28/2016 FINDINGS: CT HEAD FINDINGS Brain: No acute territorial infarction, hemorrhage or intracranial mass. Chronic left cerebellar infarct. Atrophy and moderate to severe hypodensity in the white matter consistent with small vessel ischemic disease. Stable ventricle size. Vascular: No hyperdense vessels. Carotid vascular and vertebral artery calcification Skull: Normal. Negative for fracture or focal lesion. Sinuses/Orbits: Mucosal thickening in the sinuses Other: None CT CERVICAL SPINE FINDINGS Alignment: Straightening of the cervical spine. No subluxation. Facet alignment is normal Skull base and vertebrae: Incomplete fusion  posterior arch of C1. No fracture. Erosive changes involving the dens, left anterior arch of C1, and the vertebral body of C2. Prominent pannus at C1-C2 articulation. Soft tissues and spinal canal: Prominent pannus at the C1-C2 articulation. No visible canal hematoma. Prevertebral soft tissues within normal limits Disc levels: Advanced degenerative  changes at C3-C4, C5-C6 and C6-C7. Multiple level facet degenerative change. Moderate severe bilateral foraminal stenosis C3 through C7. Upper chest: Negative.  Carotid vascular calcification. Other: None IMPRESSION: 1. No CT evidence for acute intracranial abnormality. Atrophy and small vessel ischemic changes of the white matter. Chronic left cerebellar infarct 2. Straightening of the cervical spine with multiple level degenerative changes. No acute osseous abnormality 3. Erosive changes at the dens, anterior arch of C1, and C2 vertebral body with prominent pannus formation suggesting inflammatory arthropathy. Electronically Signed   By: Jasmine Pang M.D.   On: 11/24/2018 20:45   Dg Shoulder Left  Result Date: 11/24/2018 CLINICAL DATA:  85 year old female with syncopal episode, thready pulse at that time. Left shoulder pain. EXAM: LEFT SHOULDER - 2+ VIEW COMPARISON:  Chest radiographs 09/14/2016. FINDINGS: Severe glenohumeral joint space loss appears progressed since 2018. Bone-on-bone appearance now. Chronic bulky osteophytosis. Increased subchondral sclerosis and subchondral cyst(s). No glenohumeral joint dislocation. No acute osseous abnormality identified. Negative visible left chest. IMPRESSION: No acute osseous abnormality. 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 Electronically Signed   By: Odessa Fleming M.D.   On: 11/24/2018 20:42     Antimicrobials:   None   Subjective: Patient reports he feels well, no evidence of syncope or dizziness overnight Does report feeling generally weak but no acute changes or findings  Objective: Vitals:   11/24/18 2349 11/25/18 0335 11/25/18 0522 11/25/18 0739  BP: (!) 170/63 (!) 170/63 (!) 152/56   Pulse: 60  (!) 56   Resp: 16  15   Temp: 98.2 F (36.8 C)  97.7 F (36.5 C)   TempSrc: Oral  Oral   SpO2: 100%  100% 98%  Weight: 63.5 kg     Height: 5\' 6"  (1.676 m)       Intake/Output Summary (Last 24 hours) at 11/25/2018  1356 Last data filed at 11/25/2018 0900 Gross per 24 hour  Intake 420 ml  Output 150 ml  Net 270 ml   Filed Weights   11/24/18 1933 11/24/18 2349  Weight: 64 kg 63.5 kg    Examination:  General exam: Appears calm and comfortable  Respiratory system: Clear to auscultation. Respiratory effort normal. Cardiovascular system: Sinus bradycardia Gastrointestinal system: Abdomen is nondistended, soft and nontender.  J-tube in place without evidence of erythema leakage or infection. Central nervous system: Alert and oriented. No focal neurological deficits. Extremities: Well perfused, thin but no acute findings Skin: No rashes, lesions or ulcers Psychiatry: Judgement and insight appear normal. Mood & affect appropriate.     Data Reviewed: I have personally reviewed following labs and imaging studies  CBC: Recent Labs  Lab 11/24/18 2036 11/25/18 0530  WBC 6.5 4.7  NEUTROABS 4.2  --   HGB 9.6* 9.0*  HCT 29.7* 27.5*  MCV 97.4 96.5  PLT 201 190   Basic Metabolic Panel: Recent Labs  Lab 11/24/18 2036 11/25/18 0530  NA 136 140  K 4.4 4.2  CL 99 105  CO2 26 26  GLUCOSE 86 106*  BUN 73* 70*  CREATININE 1.56* 1.53*  CALCIUM 9.6 9.7   GFR: Estimated Creatinine Clearance: 21.5 mL/min (A) (by C-G formula based on SCr of  1.53 mg/dL (H)). Liver Function Tests: Recent Labs  Lab 11/24/18 2036  AST 32  ALT 20  ALKPHOS 71  BILITOT 0.5  PROT 7.6  ALBUMIN 3.8   No results for input(s): LIPASE, AMYLASE in the last 168 hours. No results for input(s): AMMONIA in the last 168 hours. Coagulation Profile: Recent Labs  Lab 11/25/18 0530  INR 1.9*   Cardiac Enzymes: No results for input(s): CKTOTAL, CKMB, CKMBINDEX, TROPONINI in the last 168 hours. BNP (last 3 results) No results for input(s): PROBNP in the last 8760 hours. HbA1C: No results for input(s): HGBA1C in the last 72 hours. CBG: Recent Labs  Lab 11/25/18 0005 11/25/18 0747 11/25/18 1137  GLUCAP 83 96 121*    Lipid Profile: No results for input(s): CHOL, HDL, LDLCALC, TRIG, CHOLHDL, LDLDIRECT in the last 72 hours. Thyroid Function Tests: No results for input(s): TSH, T4TOTAL, FREET4, T3FREE, THYROIDAB in the last 72 hours. Anemia Panel: No results for input(s): VITAMINB12, FOLATE, FERRITIN, TIBC, IRON, RETICCTPCT in the last 72 hours. Sepsis Labs: Recent Labs  Lab 11/24/18 2036 11/24/18 2225  LATICACIDVEN 0.4* 0.6    Recent Results (from the past 240 hour(s))  SARS CORONAVIRUS 2 Nasal Swab Aptima Multi Swab     Status: None   Collection Time: 11/24/18 10:30 PM   Specimen: Aptima Multi Swab; Nasal Swab  Result Value Ref Range Status   SARS Coronavirus 2 NEGATIVE NEGATIVE Final    Comment: (NOTE) SARS-CoV-2 target nucleic acids are NOT DETECTED. The SARS-CoV-2 RNA is generally detectable in upper and lower respiratory specimens during the acute phase of infection. Negative results do not preclude SARS-CoV-2 infection, do not rule out co-infections with other pathogens, and should not be used as the sole basis for treatment or other patient management decisions. Negative results must be combined with clinical observations, patient history, and epidemiological information. The expected result is Negative. Fact Sheet for Patients: HairSlick.no Fact Sheet for Healthcare Providers: quierodirigir.com This test is not yet approved or cleared by the [place] FDA and  has been authorized for detection and/or diagnosis of SARS-CoV-2 by FDA under an Emergency Use Authorization (EUA). This EUA will remain  in effect (meaning this test can be used) for the duration of the COVID-19 declaration under Section 56 4(b)(1) of the Act, 21 U.S.C. section 360bbb-3(b)(1), unless the authorization is terminated or revoked sooner. Performed at [facility], [address], [place], [place] [address]          Radiology Studies: Dg  Chest 2 View  Result Date: 11/24/2018 CLINICAL DATA:  85 year old female with syncopal episode, thready pulse at that time. EXAM: CHEST - 2 VIEW COMPARISON:  06/16/2017 chest radiographs and earlier. FINDINGS: Semi upright AP and lateral views. Chronic elevation or eventration of the right hemidiaphragm. Improved lung volumes compared to 2019. Stable cardiac size and mediastinal contours. Borderline to mild cardiomegaly. Calcified aortic atherosclerosis. Visualized tracheal air column is within normal limits. No pneumothorax, pulmonary edema, pleural effusion or consolidation. No acute osseous abnormality identified. Negative visible bowel gas pattern. Stable cholecystectomy clips. IMPRESSION: Stable radiographic appearance of the chest since 2019. No acute cardiopulmonary abnormality. Electronically Signed   By: Odessa Fleming M.D.   On: 11/24/2018 20:40   Ct Head Wo Contrast  Result Date: 11/24/2018 CLINICAL DATA:  Syncope, fall and hit left side of head EXAM: CT HEAD WITHOUT CONTRAST CT CERVICAL SPINE WITHOUT CONTRAST TECHNIQUE: Multidetector CT imaging of the head and cervical spine was performed following the standard protocol  without intravenous contrast. Multiplanar CT image reconstructions of the cervical spine were also generated. COMPARISON:  CT 11/02/2016, CT neck 02/28/2016 FINDINGS: CT HEAD FINDINGS Brain: No acute territorial infarction, hemorrhage or intracranial mass. Chronic left cerebellar infarct. Atrophy and moderate to severe hypodensity in the white matter consistent with small vessel ischemic disease. Stable ventricle size. Vascular: No hyperdense vessels. Carotid vascular and vertebral artery calcification Skull: Normal. Negative for fracture or focal lesion. Sinuses/Orbits: Mucosal thickening in the sinuses Other: None CT CERVICAL SPINE FINDINGS Alignment: Straightening of the cervical spine. No subluxation. Facet alignment is normal Skull base and vertebrae: Incomplete fusion posterior arch  of C1. No fracture. Erosive changes involving the dens, left anterior arch of C1, and the vertebral body of C2. Prominent pannus at C1-C2 articulation. Soft tissues and spinal canal: Prominent pannus at the C1-C2 articulation. No visible canal hematoma. Prevertebral soft tissues within normal limits Disc levels: Advanced degenerative changes at C3-C4, C5-C6 and C6-C7. Multiple level facet degenerative change. Moderate severe bilateral foraminal stenosis C3 through C7. Upper chest: Negative.  Carotid vascular calcification. Other: None IMPRESSION: 1. No CT evidence for acute intracranial abnormality. Atrophy and small vessel ischemic changes of the white matter. Chronic left cerebellar infarct 2. Straightening of the cervical spine with multiple level degenerative changes. No acute osseous abnormality 3. Erosive changes at the dens, anterior arch of C1, and C2 vertebral body with prominent pannus formation suggesting inflammatory arthropathy. Electronically Signed   By: Jasmine Pang M.D.   On: 11/24/2018 20:45   Ct Cervical Spine Wo Contrast  Result Date: 11/24/2018 CLINICAL DATA:  Syncope, fall and hit left side of head EXAM: CT HEAD WITHOUT CONTRAST CT CERVICAL SPINE WITHOUT CONTRAST TECHNIQUE: Multidetector CT imaging of the head and cervical spine was performed following the standard protocol without intravenous contrast. Multiplanar CT image reconstructions of the cervical spine were also generated. COMPARISON:  CT 11/02/2016, CT neck 02/28/2016 FINDINGS: CT HEAD FINDINGS Brain: No acute territorial infarction, hemorrhage or intracranial mass. Chronic left cerebellar infarct. Atrophy and moderate to severe hypodensity in the white matter consistent with small vessel ischemic disease. Stable ventricle size. Vascular: No hyperdense vessels. Carotid vascular and vertebral artery calcification Skull: Normal. Negative for fracture or focal lesion. Sinuses/Orbits: Mucosal thickening in the sinuses Other: None CT  CERVICAL SPINE FINDINGS Alignment: Straightening of the cervical spine. No subluxation. Facet alignment is normal Skull base and vertebrae: Incomplete fusion posterior arch of C1. No fracture. Erosive changes involving the dens, left anterior arch of C1, and the vertebral body of C2. Prominent pannus at C1-C2 articulation. Soft tissues and spinal canal: Prominent pannus at the C1-C2 articulation. No visible canal hematoma. Prevertebral soft tissues within normal limits Disc levels: Advanced degenerative changes at C3-C4, C5-C6 and C6-C7. Multiple level facet degenerative change. Moderate severe bilateral foraminal stenosis C3 through C7. Upper chest: Negative.  Carotid vascular calcification. Other: None IMPRESSION: 1. No CT evidence for acute intracranial abnormality. Atrophy and small vessel ischemic changes of the white matter. Chronic left cerebellar infarct 2. Straightening of the cervical spine with multiple level degenerative changes. No acute osseous abnormality 3. Erosive changes at the dens, anterior arch of C1, and C2 vertebral body with prominent pannus formation suggesting inflammatory arthropathy. Electronically Signed   By: Jasmine Pang M.D.   On: 11/24/2018 20:45   Dg Shoulder Left  Result Date: 11/24/2018 CLINICAL DATA:  85 year old female with syncopal episode, thready pulse at that time. Left shoulder pain. EXAM: LEFT SHOULDER - 2+ VIEW COMPARISON:  Chest  radiographs 09/14/2016. FINDINGS: Severe glenohumeral joint space loss appears progressed since 2018. Bone-on-bone appearance now. Chronic bulky osteophytosis. Increased subchondral sclerosis and subchondral cyst(s). No glenohumeral joint dislocation. No acute osseous abnormality identified. Negative visible left chest. IMPRESSION: No acute osseous abnormality. 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 Electronically Signed   By: Odessa Fleming M.D.   On: 11/24/2018 20:42        Scheduled Meds:  atorvastatin  10 mg  Oral q1800   B-complex with vitamin C  1 tablet Oral Daily   calcium-vitamin D  1 tablet Oral Q breakfast   cycloSPORINE  1 drop Both Eyes BID   feeding supplement (OSMOLITE 1.2 CAL)  59.25 mL/hr Per Tube Daily   feeding supplement (OSMOLITE 1.2 CAL)  59.25 mL/hr Per Tube Once   fluticasone  2 spray Each Nare Daily   free water  150 mL Per Tube TID   furosemide  40 mg Oral Daily   gabapentin  300 mg Oral BID   hydrALAZINE  50 mg Oral TID   insulin aspart  0-9 Units Subcutaneous TID WC   insulin glargine  8 Units Subcutaneous QHS   isosorbide mononitrate  60 mg Oral Daily   latanoprost  1 drop Both Eyes QHS   levothyroxine  125 mcg Oral Daily   mometasone-formoterol  1 puff Inhalation QHS   mometasone-formoterol  2 puff Inhalation Daily   nystatin  5 mL Oral QID   pantoprazole  40 mg Oral BID   sertraline  100 mg Oral Daily   sodium chloride flush  3 mL Intravenous Q12H   warfarin  7.5 mg Oral ONCE-1800   Warfarin - Pharmacist Dosing Inpatient   Does not apply q1800   Continuous Infusions:   LOS: 0 days    Time spent: 35 min    Burke Keels, MD [facility]  If 7PM-7AM, please contact night-coverage  11/25/2018, 1:56 PM

## 2018-11-25 NOTE — Progress Notes (Signed)
ANTICOAGULATION CONSULT NOTE - Initial Consult  Pharmacy Consult for Warfarin Indication: atrial fibrillation  Allergies  Allergen Reactions  . Penicillins Itching, Rash and Other (See Comments)    Tolerated amoxicillin, unasyn, zosyn & cephalosporins in the past. Did it involve swelling of the face/tongue/throat, SOB, or low BP? No Did it involve sudden or severe rash/hives, skin peeling, or any reaction on the inside of your mouth or nose? No Did you need to seek medical attention at a hospital or doctor's office? Unknown When did it last happen?5+ years If all above answers are "NO", may proceed with cephalosporin use.     Patient Measurements: Height: 5\' 6"  (167.6 cm) Weight: 140 lb (63.5 kg) IBW/kg (Calculated) : 59.3   Vital Signs: Temp: 98.2 F (36.8 C) (08/23 2349) Temp Source: Oral (08/23 2349) BP: 170/63 (08/23 2349) Pulse Rate: 60 (08/23 2349)  Labs: Recent Labs    11/24/18 2036 11/24/18 2225  HGB 9.6*  --   HCT 29.7*  --   PLT 201  --   CREATININE 1.56*  --   TROPONINIHS 12 13    Estimated Creatinine Clearance: 21.1 mL/min (A) (by C-G formula based on SCr of 1.56 mg/dL (H)).   Medical History: Past Medical History:  Diagnosis Date  . Acute respiratory failure ([place])   . Anemia    previous blood transfusions  . Arthritis    "all over"  . Asthma   . Atrial flutter ([place])   . Bradycardia    requiring previous d/c of BB and reduction of amiodarone  . CAD (coronary artery disease)    nonobstructive per notes  . Chronic diastolic CHF (congestive heart failure) ([place])   . CKD (chronic kidney disease), stage III ([place])   . Complication of blood transfusion    "got the wrong blood type at [facility] in ~ 2015; no adverse reaction that we are aware of"/daughter, [name] (01/27/2016)  . COPD (chronic obstructive pulmonary disease) ([place])   . Depression    "light case"  . DVT (deep venous thrombosis) ([place]) 01/2016   a. LLE DVT 01/2016 - switched from  Eliquis to Coumadin.  [name] Dyspnea    with some activity  . Gastric stenosis    a. s/p stomach tube  . GERD (gastroesophageal reflux disease)   . History of blood transfusion    "several" (01/27/2016)  . History of stomach ulcers   . Hyperlipidemia   . Hypertension   . Hypothyroidism   . On home oxygen therapy    "2L; 9PM til 9AM" (06/27/2017)  . PAD (peripheral artery disease) ([place])    a. prior gangrene, toe amputations, intervention.  [name] PAF (paroxysmal atrial fibrillation) ([place])   . Paraesophageal hernia   . Perforated gastric ulcer ([place])   . Pneumonia    "a few times" (06/27/2017)  . Seasonal allergies   . SIADH (syndrome of inappropriate ADH production) ([place])    [name] 01/10/2015  . Small bowel obstruction ([place])    "I don't know how many" (01/11/2015)  . Stroke [facility])    several- no residual  . Type II diabetes mellitus ([place])    "related to prednisone use  for > 20 yr; once predinose stopped; no more DM RX" (01/27/2016)  . UTI (urinary tract infection) 02/08/2016  . Ventral hernia with bowel obstruction     Medications:  Scheduled:  . atorvastatin  10 mg Oral q1800  . B-complex with vitamin C  1 tablet Oral Daily  . calcium-vitamin D  1  tablet Oral Q breakfast  . cycloSPORINE  1 drop Both Eyes BID  . feeding supplement (OSMOLITE 1.2 CAL)  59.25 mL/hr Per Tube Daily  . fluticasone  2 spray Each Nare Daily  . free water  150 mL Per Tube TID  . furosemide  40 mg Oral Daily  . gabapentin  300 mg Oral BID  . hydrALAZINE  50 mg Oral TID  . insulin aspart  0-9 Units Subcutaneous TID WC  . insulin glargine  8 Units Subcutaneous QHS  . isosorbide mononitrate  60 mg Oral Daily  . latanoprost  1 drop Both Eyes QHS  . levothyroxine  125 mcg Oral Daily  . mometasone-formoterol  1 puff Inhalation QHS  . mometasone-formoterol  2 puff Inhalation Daily  . nystatin  5 mL Oral QID  . pantoprazole  40 mg Oral BID  . sertraline  100 mg Oral Daily  . sodium chloride flush  3 mL  Intravenous Q12H   Infusions:    Assessment: 27 yoF s/p syncopal episode and x3 compressions by EMS on chronic warfarin for a-fib. HD 5 mg daily except 7.5 mg on Sun. LD 8/22 1900. INR = 1.9.   Goal of Therapy:  INR 2-3    Plan:  Warfarin 7.5 mg x1 this am Daily PT/INR  Dorrene German 11/25/2018,12:16 AM

## 2018-11-26 DIAGNOSIS — E039 Hypothyroidism, unspecified: Secondary | ICD-10-CM

## 2018-11-26 LAB — URINE CULTURE

## 2018-11-26 LAB — PROTIME-INR
INR: 1.7 — ABNORMAL HIGH (ref 0.8–1.2)
Prothrombin Time: 19.6 seconds — ABNORMAL HIGH (ref 11.4–15.2)

## 2018-11-26 LAB — GLUCOSE, CAPILLARY
Glucose-Capillary: 167 mg/dL — ABNORMAL HIGH (ref 70–99)
Glucose-Capillary: 98 mg/dL (ref 70–99)

## 2018-11-26 MED ORDER — AMLODIPINE BESYLATE 5 MG PO TABS: 5.0000 mg | ORAL_TABLET | Freq: Every day | ORAL | 11 refills | Status: DC

## 2018-11-26 MED ORDER — NYSTATIN 100000 UNIT/ML MT SUSP: 5.0000 mL | Freq: Four times a day (QID) | OROMUCOSAL | 0 refills | Status: DC

## 2018-11-26 MED ORDER — LIP MEDEX EX OINT: TOPICAL_OINTMENT | CUTANEOUS | Status: DC | PRN

## 2018-11-26 MED ORDER — WARFARIN SODIUM 5 MG PO TABS: 7.5000 mg | ORAL_TABLET | Freq: Once | ORAL | Status: DC

## 2018-11-26 MED ORDER — LIP MEDEX EX OINT
TOPICAL_OINTMENT | CUTANEOUS | Status: AC
  Administered 2018-11-26: 03:00:00
  Filled 2018-11-26: qty 7

## 2018-11-26 NOTE — Discharge Summary (Signed)
Physician Discharge Summary  Shannon [name] DOB: February 07, 1924 DOA: 11/24/2018  PCP: Sharon Seller, NP  Admit date: 11/24/2018 Discharge date: 11/26/2018  Admitted From: Inpatient Disposition: home  Recommendations for Outpatient Follow-up:  1. Follow up with PCP in 1-2 weeks 2. Please obtain BMP/CBC in one week 3. Please follow up on the following pending results:  Home Health:No, rec'd HHPT but PT and daughter declined Equipment/Devices: No new devices Discharge Condition:Fair CODE STATUS:DNR Diet recommendation: Regular healthy diet  Brief/Interim Summary: Shannon Casperson Smithis a 85 y.o.femalewith medical history significant forchronic diastolic CHF, paroxysmal atrial fibrillation on Coumadin,gastric outlet obstruction s/p gastrojejunostomy feeding tube placement,nonobstructive CAD, history of CVA, COPD, CKD stage III, insulin-dependent type 2 diabetes, hypertension, hyperlipidemia, anemia of chronic disease, hypothyroidism, and depression who presents to the ED for evaluation of a syncopal event at home.  Patient states she was sitting down in a chair at home while her granddaughter was braiding her hair. She began to feel lightheaded and then the next she remembers is waking up on the floor. Daughter states that patient suddenly slumped over in a chair and was unresponsive. She was laid back in the chair and was noted to have a weak pulse. She was given chest compressions x3 and then woke up.  Patient reports feeling occasional palpitations but denies any recent chest pain, subjective fevers, chills, diaphoresis, abdominal pain. She reports good urine output without dysuria. She denies any dyspnea or cough.  Hospital course: Syncope: Unclear etiology, suspect possible bradycardic episode. Orthostatic vital signs were negative. High-sensitivity troponin normal x2. EKG with sinus rhythm with rate in the low 60s. CT head is negative. Patient is currently  alert and oriented and back to her normal baseline per daughter at bedside.  Team to monitor the patient while in the hospital, discontinued Coreg, will treat blood pressure with amlodipine 5 mg p.o. daily, echocardiogram without acute findings calcification noted, patient was seen by PT and OT with recommendation for home health PT.  Discussed with both patient and family who have declined home health PT   Paroxysmal atrial fibrillation: In sinus rhythm with borderline bradycardia on admission. On Coumadin with stable hemoglobin -Continued Coumadin per pharmacy while in the hospital patient will continue on discharge Held Coreg as patient was mildly bradycardic, after medication washed out there was no evidence of rapid heart rate. Discussed with patient's daughter who indicated her primary care physician was planning on discontinuing the Coreg.  Will discontinue with Norvasc as above for blood pressure control  Chronic diastolic CHF: EF 25-95%. Appears compensated on admission. -Holding Coreg as above -Continue home Lasix 40 mg daily discharge without change -Monitored strict I/O's, daily weights on the hospital no evidence of volume overload  COPD: Appears stable without active wheezing. Continue home medication on discharge  Insulin-dependent type 2 diabetes: Patient was on Lantus as well as sliding scale insulin in the hospital Continue home medication on discharge without change  Hypertension: Patient actually hypertensive on admission. Orthostatic vital signs were negative. -Resume home hydralazine, Imdur -Holding Coreg as above  -Added Norvasc on discharge  Gastric outlet obstruction s/p GJ feeding tube in place: Appears stable without acute issue. Continue Osmolite and flushing with free water 3 times daily. Patient otherwise reportedly tolerates liquid diet by mouth.  Anemia of chronic disease: Chronic and stable without obvious bleeding. Continue to monitor on  Coumadin PCP for outpatient follow-up  CKD stage III: Appears at baseline on admission. Continue to monitor.  History of CVA: Without  residual deficit. Continue atorvastatin and Coumadin discharge without change  PVD: S/p angioplasty right SFA and popliteal arteries and toe amputations on both feet. Continue Coumadin and atorvastatin.  Hyperlipidemia: Continue atorvastatin.  Hypothyroidism: Continue Synthroid.  Depression: Continue sertraline.  Oral thrush: Possibly secondary to inhaled corticosteroid. Continue oral nystatin suspension.  Discharge Diagnoses:  Principal Problem:   Syncope Active Problems:   Essential hypertension, benign   Diabetes mellitus type 2 in nonobese (HCC)   Hypothyroidism   Anemia of chronic disease   Chronic kidney insufficiency, stage 3 (moderate) (HCC)   Hyperlipidemia   Atrial fibrillation and flutter (HCC)   Chronic diastolic CHF (congestive heart failure) (HCC)   COPD with asthma (HCC)   Bradycardia    Discharge Instructions  Discharge Instructions    Call MD for:   Complete by: As directed    Any acute change in medical condition   Diet - low sodium heart healthy   Complete by: As directed    Increase activity slowly   Complete by: As directed      Allergies as of 11/26/2018      Reactions   Penicillins Itching, Rash, Other (See Comments)   Tolerated amoxicillin, unasyn, zosyn & cephalosporins in the past. Did it involve swelling of the face/tongue/throat, SOB, or low BP? No Did it involve sudden or severe rash/hives, skin peeling, or any reaction on the inside of your mouth or nose? No Did you need to seek medical attention at a hospital or doctor's office? Unknown When did it last happen?5+ years If all above answers are "NO", may proceed with cephalosporin use.      Medication List    STOP taking these medications   carvedilol 6.25 MG tablet Commonly known as: COREG     TAKE these medications    acetaminophen 500 MG tablet Commonly known as: TYLENOL Take 500 mg by mouth every 6 (six) hours as needed (pain).   amLODipine 5 MG tablet Commonly known as: NORVASC Take 1 tablet (5 mg total) by mouth daily.   ARTIFICIAL TEARS OP Place 1 drop into both eyes 4 (four) times daily.   atorvastatin 10 MG tablet Commonly known as: LIPITOR Take 1 tablet (10 mg total) by mouth daily.   b complex vitamins tablet Take 1 tablet by mouth daily.   calcium-vitamin D 500-200 MG-UNIT tablet Commonly known as: OSCAL WITH D Take 1 tablet by mouth daily with breakfast.   collagenase ointment Commonly known as: SANTYL Apply 1 application topically daily as needed (wound care).   cycloSPORINE 0.05 % ophthalmic emulsion Commonly known as: Restasis USE 1 DROP INTO BOTH EYES TWICE DAILY What changed:   how much to take  how to take this  when to take this  additional instructions   Dulera 200-5 MCG/ACT Aero Generic drug: mometasone-formoterol TAKE TWO PUFFS BY MOUTH TWICE DAILY What changed:   how much to take  how to take this  when to take this  additional instructions   feeding supplement (PRO-STAT SUGAR FREE 64) Liqd Take 30 mLs by mouth 2 (two) times daily.   feeding supplement Liqd Take 711 mLs by mouth See admin instructions. For 12 hours 2100 - 0900   ferrous sulfate 325 (65 FE) MG tablet TAKE 1 TABLET BY MOUTH EVERY DAY   fluticasone 50 MCG/ACT nasal spray Commonly known as: FLONASE SPRAY 2 SPRAYS INTO EACH NOSTRIL EVERY DAY What changed:   how much to take  how to take this  when to take this  additional instructions   free water Soln Place 150 mLs into feeding tube 3 (three) times daily.   furosemide 40 MG tablet Commonly known as: LASIX TAKE 1 TABLET BY MOUTH EVERY DAY   gabapentin 300 MG capsule Commonly known as: NEURONTIN TAKE 1 CAPSULE BY MOUTH TWICE A DAY   gentamicin cream 0.1 % Commonly known as: GARAMYCIN Apply 1 application  topically 3 (three) times daily as needed (dry skin).   hydrALAZINE 50 MG tablet Commonly known as: APRESOLINE TAKE 1 TABLET BY MOUTH THREE TIMES A DAY   insulin aspart 100 UNIT/ML FlexPen Commonly known as: NOVOLOG Inject 0-6 Units into the skin 3 (three) times daily with meals. New sliding Scale If BS is 151 - 251 = 2 units,  If BS is 251 -  350 = 4 units,  If BS is 351 - 450 = 6 units Call provider if BS runs more than 400   Insulin Pen Needle 31G X 5 MM Misc Commonly known as: B-D UF III MINI PEN NEEDLES Use twice daily with giving insulin injections. Dx E11.9   ipratropium-albuterol 0.5-2.5 (3) MG/3ML Soln Commonly known as: DUONEB Take 3 mLs by nebulization every 6 (six) hours as needed (sob). Use 3 times daily x 4 days then every 6 hours as needed. What changed: additional instructions   isosorbide mononitrate 30 MG 24 hr tablet Commonly known as: IMDUR Take 2 tablets (60 mg total) by mouth daily. Please make annual appt with Dr. Delton See for future refills. Thank you. 1st attempt. What changed: additional instructions   Lantus 100 UNIT/ML injection Generic drug: insulin glargine Inject 8 Units into the skin at bedtime.   latanoprost 0.005 % ophthalmic solution Commonly known as: XALATAN Place 1 drop into both eyes at bedtime.   levalbuterol 45 MCG/ACT inhaler Commonly known as: Xopenex HFA Inhale 2 puffs into the lungs every 6 (six) hours as needed for wheezing or shortness of breath.   levothyroxine 125 MCG tablet Commonly known as: SYNTHROID Take 1 tablet (125 mcg total) by mouth daily.   loratadine 10 MG tablet Commonly known as: CLARITIN Take 10 mg by mouth daily as needed for allergies.   multivitamin with minerals Tabs tablet Take 1 tablet by mouth daily.   nystatin powder Commonly known as: MYCOSTATIN/NYSTOP Apply 1 g topically as needed (skin irritation).   oxyCODONE 5 MG immediate release tablet Commonly known as: Roxicodone Take 1 tablet (5 mg  total) by mouth every 4 (four) hours as needed for severe pain. May take 1/2 tab q4hrs as needed for mild pain. What changed: how much to take   OXYGEN Inhale 2 L into the lungs at bedtime.   pantoprazole 40 MG tablet Commonly known as: PROTONIX Take 1 tablet (40 mg total) by mouth 2 (two) times daily.   sertraline 100 MG tablet Commonly known as: ZOLOFT TAKE 1 TABLET BY MOUTH EVERY DAY FOR DEPRESSION What changed:   how much to take  how to take this  when to take this  additional instructions   Systane Nighttime Oint Place 1 application into both eyes at bedtime.   warfarin 2.5 MG tablet Commonly known as: COUMADIN Take as directed. If you are unsure how to take this medication, talk to your nurse or doctor. Original instructions: TAKE AS DIRECTED BY COUMADIN CLINIC. What changed: See the new instructions.       Allergies  Allergen Reactions  . Penicillins Itching, Rash and Other (See Comments)    Tolerated amoxicillin, unasyn, zosyn &  cephalosporins in the past. Did it involve swelling of the face/tongue/throat, SOB, or low BP? No Did it involve sudden or severe rash/hives, skin peeling, or any reaction on the inside of your mouth or nose? No Did you need to seek medical attention at a hospital or doctor's office? Unknown When did it last happen?5+ years If all above answers are "NO", may proceed with cephalosporin use.     Consultations:  None   Procedures/Studies: Dg Chest 2 View  Result Date: 11/24/2018 CLINICAL DATA:  85 year old female with syncopal episode, thready pulse at that time. EXAM: CHEST - 2 VIEW COMPARISON:  06/16/2017 chest radiographs and earlier. FINDINGS: Semi upright AP and lateral views. Chronic elevation or eventration of the right hemidiaphragm. Improved lung volumes compared to 2019. Stable cardiac size and mediastinal contours. Borderline to mild cardiomegaly. Calcified aortic atherosclerosis. Visualized tracheal air column is  within normal limits. No pneumothorax, pulmonary edema, pleural effusion or consolidation. No acute osseous abnormality identified. Negative visible bowel gas pattern. Stable cholecystectomy clips. IMPRESSION: Stable radiographic appearance of the chest since 2019. No acute cardiopulmonary abnormality. Electronically Signed   By: Odessa Fleming M.D.   On: 11/24/2018 20:40   Ct Head Wo Contrast  Result Date: 11/24/2018 CLINICAL DATA:  Syncope, fall and hit left side of head EXAM: CT HEAD WITHOUT CONTRAST CT CERVICAL SPINE WITHOUT CONTRAST TECHNIQUE: Multidetector CT imaging of the head and cervical spine was performed following the standard protocol without intravenous contrast. Multiplanar CT image reconstructions of the cervical spine were also generated. COMPARISON:  CT 11/02/2016, CT neck 02/28/2016 FINDINGS: CT HEAD FINDINGS Brain: No acute territorial infarction, hemorrhage or intracranial mass. Chronic left cerebellar infarct. Atrophy and moderate to severe hypodensity in the white matter consistent with small vessel ischemic disease. Stable ventricle size. Vascular: No hyperdense vessels. Carotid vascular and vertebral artery calcification Skull: Normal. Negative for fracture or focal lesion. Sinuses/Orbits: Mucosal thickening in the sinuses Other: None CT CERVICAL SPINE FINDINGS Alignment: Straightening of the cervical spine. No subluxation. Facet alignment is normal Skull base and vertebrae: Incomplete fusion posterior arch of C1. No fracture. Erosive changes involving the dens, left anterior arch of C1, and the vertebral body of C2. Prominent pannus at C1-C2 articulation. Soft tissues and spinal canal: Prominent pannus at the C1-C2 articulation. No visible canal hematoma. Prevertebral soft tissues within normal limits Disc levels: Advanced degenerative changes at C3-C4, C5-C6 and C6-C7. Multiple level facet degenerative change. Moderate severe bilateral foraminal stenosis C3 through C7. Upper chest: Negative.   Carotid vascular calcification. Other: None IMPRESSION: 1. No CT evidence for acute intracranial abnormality. Atrophy and small vessel ischemic changes of the white matter. Chronic left cerebellar infarct 2. Straightening of the cervical spine with multiple level degenerative changes. No acute osseous abnormality 3. Erosive changes at the dens, anterior arch of C1, and C2 vertebral body with prominent pannus formation suggesting inflammatory arthropathy. Electronically Signed   By: Jasmine Pang M.D.   On: 11/24/2018 20:45   Ct Cervical Spine Wo Contrast  Result Date: 11/24/2018 CLINICAL DATA:  Syncope, fall and hit left side of head EXAM: CT HEAD WITHOUT CONTRAST CT CERVICAL SPINE WITHOUT CONTRAST TECHNIQUE: Multidetector CT imaging of the head and cervical spine was performed following the standard protocol without intravenous contrast. Multiplanar CT image reconstructions of the cervical spine were also generated. COMPARISON:  CT 11/02/2016, CT neck 02/28/2016 FINDINGS: CT HEAD FINDINGS Brain: No acute territorial infarction, hemorrhage or intracranial mass. Chronic left cerebellar infarct. Atrophy and moderate to severe hypodensity  in the white matter consistent with small vessel ischemic disease. Stable ventricle size. Vascular: No hyperdense vessels. Carotid vascular and vertebral artery calcification Skull: Normal. Negative for fracture or focal lesion. Sinuses/Orbits: Mucosal thickening in the sinuses Other: None CT CERVICAL SPINE FINDINGS Alignment: Straightening of the cervical spine. No subluxation. Facet alignment is normal Skull base and vertebrae: Incomplete fusion posterior arch of C1. No fracture. Erosive changes involving the dens, left anterior arch of C1, and the vertebral body of C2. Prominent pannus at C1-C2 articulation. Soft tissues and spinal canal: Prominent pannus at the C1-C2 articulation. No visible canal hematoma. Prevertebral soft tissues within normal limits Disc levels: Advanced  degenerative changes at C3-C4, C5-C6 and C6-C7. Multiple level facet degenerative change. Moderate severe bilateral foraminal stenosis C3 through C7. Upper chest: Negative.  Carotid vascular calcification. Other: None IMPRESSION: 1. No CT evidence for acute intracranial abnormality. Atrophy and small vessel ischemic changes of the white matter. Chronic left cerebellar infarct 2. Straightening of the cervical spine with multiple level degenerative changes. No acute osseous abnormality 3. Erosive changes at the dens, anterior arch of C1, and C2 vertebral body with prominent pannus formation suggesting inflammatory arthropathy. Electronically Signed   By: Jasmine Pang M.D.   On: 11/24/2018 20:45   Dg Shoulder Left  Result Date: 11/24/2018 CLINICAL DATA:  85 year old female with syncopal episode, thready pulse at that time. Left shoulder pain. EXAM: LEFT SHOULDER - 2+ VIEW COMPARISON:  Chest radiographs 09/14/2016. FINDINGS: Severe glenohumeral joint space loss appears progressed since 2018. Bone-on-bone appearance now. Chronic bulky osteophytosis. Increased subchondral sclerosis and subchondral cyst(s). No glenohumeral joint dislocation. No acute osseous abnormality identified. Negative visible left chest. IMPRESSION: No acute osseous abnormality. 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 Electronically Signed   By: Odessa Fleming M.D.   On: 11/24/2018 20:42       Subjective: Patient reports he feels at baseline No episodes of dizziness or syncope while in the hospital  Discharge Exam: Vitals:   11/26/18 0615 11/26/18 0854  BP: (!) 154/65   Pulse: 60   Resp: 16   Temp: 98.1 F (36.7 C)   SpO2: 100% 96%   Vitals:   11/25/18 2138 11/26/18 0500 11/26/18 0615 11/26/18 0854  BP: (!) 143/54  (!) 154/65   Pulse: (!) 56  60   Resp:   16   Temp: 97.9 F (36.6 C)  98.1 F (36.7 C)   TempSrc: Oral  Oral   SpO2: 100%  100% 96%  Weight:  63.8 kg    Height:        General: Pt  is alert, awake, not in acute distress, quite alert for 85 year old female Cardiovascular: RRR, S1/S2 +, no rubs, no gallops Respiratory: CTA bilaterally, no wheezing, no rhonchi Abdominal: Soft, NT, ND, bowel sounds + Extremities: no edema, no cyanosis, toe amputations as described above    The results of significant diagnostics from this hospitalization (including imaging, microbiology, ancillary and laboratory) are listed below for reference.     Microbiology: Recent Results (from the past 240 hour(s))  Urine culture     Status: None   Collection Time: 11/24/18  7:52 PM   Specimen: Urine, Random  Result Value Ref Range Status   Specimen Description   Final    URINE, RANDOM Performed at [facility], [address], [place], [place] [address]    Special Requests   Final    NONE Performed at [facility], [address]  [place], [place] [address]    Culture   Final    Multiple bacterial morphotypes present, none predominant. Suggest appropriate recollection if clinically indicated.   Report Status 11/26/2018 FINAL  Final  SARS CORONAVIRUS 2 Nasal Swab Aptima Multi Swab     Status: None   Collection Time: 11/24/18 10:30 PM   Specimen: Aptima Multi Swab; Nasal Swab  Result Value Ref Range Status   SARS Coronavirus 2 NEGATIVE NEGATIVE Final    Comment: (NOTE) SARS-CoV-2 target nucleic acids are NOT DETECTED. The SARS-CoV-2 RNA is generally detectable in upper and lower respiratory specimens during the acute phase of infection. Negative results do not preclude SARS-CoV-2 infection, do not rule out co-infections with other pathogens, and should not be used as the sole basis for treatment or other patient management decisions. Negative results must be combined with clinical observations, patient history, and epidemiological information. The expected result is Negative. Fact Sheet for  Patients: HairSlick.no Fact Sheet for Healthcare Providers: quierodirigir.com This test is not yet approved or cleared by the [place] FDA and  has been authorized for detection and/or diagnosis of SARS-CoV-2 by FDA under an Emergency Use Authorization (EUA). This EUA will remain  in effect (meaning this test can be used) for the duration of the COVID-19 declaration under Section 56 4(b)(1) of the Act, 21 U.S.C. section 360bbb-3(b)(1), unless the authorization is terminated or revoked sooner. Performed at [facility], [address] [place], [place] [address]      Labs: BNP (last 3 results) Recent Labs    11/25/18 0530  BNP 233.9*   Basic Metabolic Panel: Recent Labs  Lab 11/24/18 2036 11/25/18 0530  NA 136 140  K 4.4 4.2  CL 99 105  CO2 26 26  GLUCOSE 86 106*  BUN 73* 70*  CREATININE 1.56* 1.53*  CALCIUM 9.6 9.7   Liver Function Tests: Recent Labs  Lab 11/24/18 2036  AST 32  ALT 20  ALKPHOS 71  BILITOT 0.5  PROT 7.6  ALBUMIN 3.8   No results for input(s): LIPASE, AMYLASE in the last 168 hours. No results for input(s): AMMONIA in the last 168 hours. CBC: Recent Labs  Lab 11/24/18 2036 11/25/18 0530  WBC 6.5 4.7  NEUTROABS 4.2  --   HGB 9.6* 9.0*  HCT 29.7* 27.5*  MCV 97.4 96.5  PLT 201 190   Cardiac Enzymes: No results for input(s): CKTOTAL, CKMB, CKMBINDEX, TROPONINI in the last 168 hours. BNP: Invalid input(s): POCBNP CBG: Recent Labs  Lab 11/25/18 1137 11/25/18 1646 11/25/18 2134 11/26/18 0732 11/26/18 1156  GLUCAP 121* 109* 113* 167* 98   D-Dimer No results for input(s): DDIMER in the last 72 hours. Hgb A1c No results for input(s): HGBA1C in the last 72 hours. Lipid Profile No results for input(s): CHOL, HDL, LDLCALC, TRIG, CHOLHDL, LDLDIRECT in the last 72 hours. Thyroid function studies No results for input(s): TSH, T4TOTAL, T3FREE, THYROIDAB in the last 72  hours.  Invalid input(s): FREET3 Anemia work up No results for input(s): VITAMINB12, FOLATE, FERRITIN, TIBC, IRON, RETICCTPCT in the last 72 hours. Urinalysis    Component Value Date/Time   COLORURINE YELLOW 11/24/2018 1952   APPEARANCEUR HAZY (A) 11/24/2018 1952   LABSPEC 1.008 11/24/2018 1952   PHURINE 7.0 11/24/2018 1952   GLUCOSEU NEGATIVE 11/24/2018 1952   HGBUR NEGATIVE 11/24/2018 1952   BILIRUBINUR NEGATIVE 11/24/2018 1952   BILIRUBINUR Neg 11/01/2017 1228   KETONESUR NEGATIVE 11/24/2018 1952   PROTEINUR NEGATIVE 11/24/2018 1952   UROBILINOGEN 0.2  11/01/2017 1228   UROBILINOGEN 0.2 01/27/2015 2308   NITRITE NEGATIVE 11/24/2018 1952   LEUKOCYTESUR NEGATIVE 11/24/2018 1952   Sepsis Labs Invalid input(s): PROCALCITONIN,  WBC,  LACTICIDVEN Microbiology Recent Results (from the past 240 hour(s))  Urine culture     Status: None   Collection Time: 11/24/18  7:52 PM   Specimen: Urine, Random  Result Value Ref Range Status   Specimen Description   Final    URINE, RANDOM Performed at [facility], [address], [place], [place] [address]    Special Requests   Final    NONE Performed at [facility], [address] [place], [place] [address]    Culture   Final    Multiple bacterial morphotypes present, none predominant. Suggest appropriate recollection if clinically indicated.   Report Status 11/26/2018 FINAL  Final  SARS CORONAVIRUS 2 Nasal Swab Aptima Multi Swab     Status: None   Collection Time: 11/24/18 10:30 PM   Specimen: Aptima Multi Swab; Nasal Swab  Result Value Ref Range Status   SARS Coronavirus 2 NEGATIVE NEGATIVE Final    Comment: (NOTE) SARS-CoV-2 target nucleic acids are NOT DETECTED. The SARS-CoV-2 RNA is generally detectable in upper and lower respiratory specimens during the acute phase of infection. Negative results do not preclude SARS-CoV-2 infection, do not rule out co-infections with other pathogens, and  should not be used as the sole basis for treatment or other patient management decisions. Negative results must be combined with clinical observations, patient history, and epidemiological information. The expected result is Negative. Fact Sheet for Patients: HairSlick.no Fact Sheet for Healthcare Providers: quierodirigir.com This test is not yet approved or cleared by the [place] FDA and  has been authorized for detection and/or diagnosis of SARS-CoV-2 by FDA under an Emergency Use Authorization (EUA). This EUA will remain  in effect (meaning this test can be used) for the duration of the COVID-19 declaration under Section 56 4(b)(1) of the Act, 21 U.S.C. section 360bbb-3(b)(1), unless the authorization is terminated or revoked sooner. Performed at [facility], [address], [place], [place] [address]      Time coordinating discharge: Over 30 minutes  SIGNED:   Burke Keels, MD  [facility] 11/26/2018, 12:15 PM Pager   If 7PM-7AM, please contact night-coverage [email or web address] Password [email or web address]

## 2018-11-26 NOTE — Progress Notes (Signed)
[facility] for Warfarin Indication: atrial fibrillation, hx of DVT   Allergies  Allergen Reactions  . Penicillins Itching, Rash and Other (See Comments)    Tolerated amoxicillin, unasyn, zosyn & cephalosporins in the past. Did it involve swelling of the face/tongue/throat, SOB, or low BP? No Did it involve sudden or severe rash/hives, skin peeling, or any reaction on the inside of your mouth or nose? No Did you need to seek medical attention at a hospital or doctor's office? Unknown When did it last happen?5+ years If all above answers are "NO", may proceed with cephalosporin use.     Patient Measurements: Height: 5\' 6"  (167.6 cm) Weight: 140 lb 10.5 oz (63.8 kg) IBW/kg (Calculated) : 59.3   Vital Signs: Temp: 98.1 F (36.7 C) (08/25 0615) Temp Source: Oral (08/25 0615) BP: 154/65 (08/25 0615) Pulse Rate: 60 (08/25 0615)  Labs: Recent Labs    11/24/18 2036 11/24/18 2225 11/25/18 0530 11/26/18 0526  HGB 9.6*  --  9.0*  --   HCT 29.7*  --  27.5*  --   PLT 201  --  190  --   LABPROT  --   --  21.9* 19.6*  INR  --   --  1.9* 1.7*  CREATININE 1.56*  --  1.53*  --   TROPONINIHS 12 13  --   --     Estimated Creatinine Clearance: 21.5 mL/min (A) (by C-G formula based on SCr of 1.53 mg/dL (H)).   Medical History: Past Medical History:  Diagnosis Date  . Acute respiratory failure ([place])   . Anemia    previous blood transfusions  . Arthritis    "all over"  . Asthma   . Atrial flutter ([place])   . Bradycardia    requiring previous d/c of BB and reduction of amiodarone  . CAD (coronary artery disease)    nonobstructive per notes  . Chronic diastolic CHF (congestive heart failure) ([place])   . CKD (chronic kidney disease), stage III ([place])   . Complication of blood transfusion    "got the wrong blood type at [facility] in ~ 2015; no adverse reaction that we are aware of"/daughter, [name] (01/27/2016)  . COPD (chronic obstructive  pulmonary disease) ([place])   . Depression    "light case"  . DVT (deep venous thrombosis) ([place]) 01/2016   a. LLE DVT 01/2016 - switched from Eliquis to Coumadin.  [name] Dyspnea    with some activity  . Gastric stenosis    a. s/p stomach tube  . GERD (gastroesophageal reflux disease)   . History of blood transfusion    "several" (01/27/2016)  . History of stomach ulcers   . Hyperlipidemia   . Hypertension   . Hypothyroidism   . On home oxygen therapy    "2L; 9PM til 9AM" (06/27/2017)  . PAD (peripheral artery disease) ([place])    a. prior gangrene, toe amputations, intervention.  [name] PAF (paroxysmal atrial fibrillation) ([place])   . Paraesophageal hernia   . Perforated gastric ulcer ([place])   . Pneumonia    "a few times" (06/27/2017)  . Seasonal allergies   . SIADH (syndrome of inappropriate ADH production) ([place])    [name] 01/10/2015  . Small bowel obstruction ([place])    "I don't know how many" (01/11/2015)  . Stroke [facility])    several- no residual  . Type II diabetes mellitus ([place])    "related to prednisone use  for > 20 yr; once predinose stopped; no more  DM RX" (01/27/2016)  . UTI (urinary tract infection) 02/08/2016  . Ventral hernia with bowel obstruction     Assessment: 71 yoF s/p syncopal episode and x3 compressions by EMS on chronic warfarin for a-fib and hx of DVT. Home dose of warfarin reported as 5mg  daily except 7.5mg  on Sundays with last dose PTA on 8/22 at 1900. Admission INR = 1.9. Pharmacy consulted for warfarin dosing while patient admitted.    11/26/2018:  INR decreased to 1.7, likely secondary to missed dose 8/23  CBC: Hgb low at 9, Pltc WNL (on 8/24)  No bleeding noted  No significant drug-drug interactions noted  Ordered full liquid diet, also has Osmolite 1.2 per J-tube  Goal of Therapy:  INR 2-3  Plan:  Repeat higher dose of warfarin 7.5mg  PO x 1 today Daily PT/INR Monitor closely for s/sx of bleeding   Lindell Spar, PharmD, BCPS Clinical Pharmacist   11/26/2018,12:06 PM

## 2018-11-26 NOTE — TOC Transition Note (Signed)
Transition of Care [facility]) - CM/SW Discharge Note   Patient Details  Name: NIKIESHA MILFORD MRN: [id number] Date of Birth: 1923-12-15  [facility] CM/SW Contact:  Dessa Phi, RN Phone Number: 11/26/2018, 11:58 AM   Clinical Narrative: Damaris Schooner to dtr Andria Meuse phone & patient-they both pleasantly decline HHPT/HHOT-they feel they can manage @ home. Patient has cane,rw,home 02 HS. Chronic JT-they manage on own.Has own transport home. No further CM needs.      Final next level of care: Home/Self Care Barriers to Discharge: No Barriers Identified   Patient Goals and CMS Choice Patient states their goals for this hospitalization and ongoing recovery are:: go home CMS Medicare.gov Compare Post Acute Care list provided to:: Patient Choice offered to / list presented to : Patient, Adult Children  Discharge Placement                       Discharge Plan and Services                          HH Arranged: Patient Refused [facility]          Social Determinants of Health (SDOH) Interventions     Readmission Risk Interventions No flowsheet data found.

## 2018-11-26 NOTE — Progress Notes (Signed)
Patient is stable for discharge. Discharge instructions and medications have been reviewed with the patient and her daughter and all questions answered. AVS and prescriptions given to patient.  11/26/2018 2:45 PM Shannon [name], [place] Din

## 2018-11-26 NOTE — Plan of Care (Signed)

## 2018-11-28 ENCOUNTER — Telehealth: Payer: Self-pay | Admitting: *Deleted

## 2018-11-28 NOTE — Telephone Encounter (Signed)
Transition Care Management Follow-up Telephone Call  Date of discharge and from where: 11/26/18 [facility]  How have you been since you were released from the hospital? A lot better  Any questions or concerns? No   Items Reviewed:  Did the pt receive and understand the discharge instructions provided? Yes   Medications obtained and verified? Yes   Any new allergies since your discharge? NO  Dietary orders reviewed? Yes  Do you have support at home? Yes   Other (ie: DME, Home Health, etc) Home Health Offered  Functional Questionnaire: (I = Independent and D = Dependent) ADL's: I With assistance  Bathing/Dressing- I with assistance   Meal Prep- D  Eating- I   Maintaining continence- I  Transferring/Ambulation- I with assistance  Managing Meds- I with assistance   Follow up appointments reviewed:    PCP Hospital f/u appt confirmed? Yes  Scheduled to see Janett Billow on 12/02/18 .  [facility] f/u appt confirmed? No    Are transportation arrangements needed? No   If their condition worsens, is the pt aware to call  their PCP or go to the ED? Yes  Was the patient provided with contact information for the PCP's office or ED? Yes  Was the pt encouraged to call back with questions or concerns? Yes

## 2018-11-29 ENCOUNTER — Ambulatory Visit (INDEPENDENT_AMBULATORY_CARE_PROVIDER_SITE_OTHER): Payer: Medicare Other

## 2018-11-29 ENCOUNTER — Other Ambulatory Visit: Payer: Self-pay

## 2018-11-29 DIAGNOSIS — I4891 Unspecified atrial fibrillation: Secondary | ICD-10-CM | POA: Diagnosis not present

## 2018-11-29 DIAGNOSIS — Z5181 Encounter for therapeutic drug level monitoring: Secondary | ICD-10-CM | POA: Diagnosis not present

## 2018-11-29 DIAGNOSIS — I4892 Unspecified atrial flutter: Secondary | ICD-10-CM

## 2018-11-29 LAB — POCT INR: INR: 2.2 (ref 2.0–3.0)

## 2018-11-29 IMAGING — XA IR REPLACE G/J TUBE W/ FLUORO
1 series · 1 of 1 positions shown · non-contrast
Comparison: none

INDICATION: Jejunostomy tube leak

[Series 300: ir gj tube change · 1 of 1 slices shown]
[im 1/1]
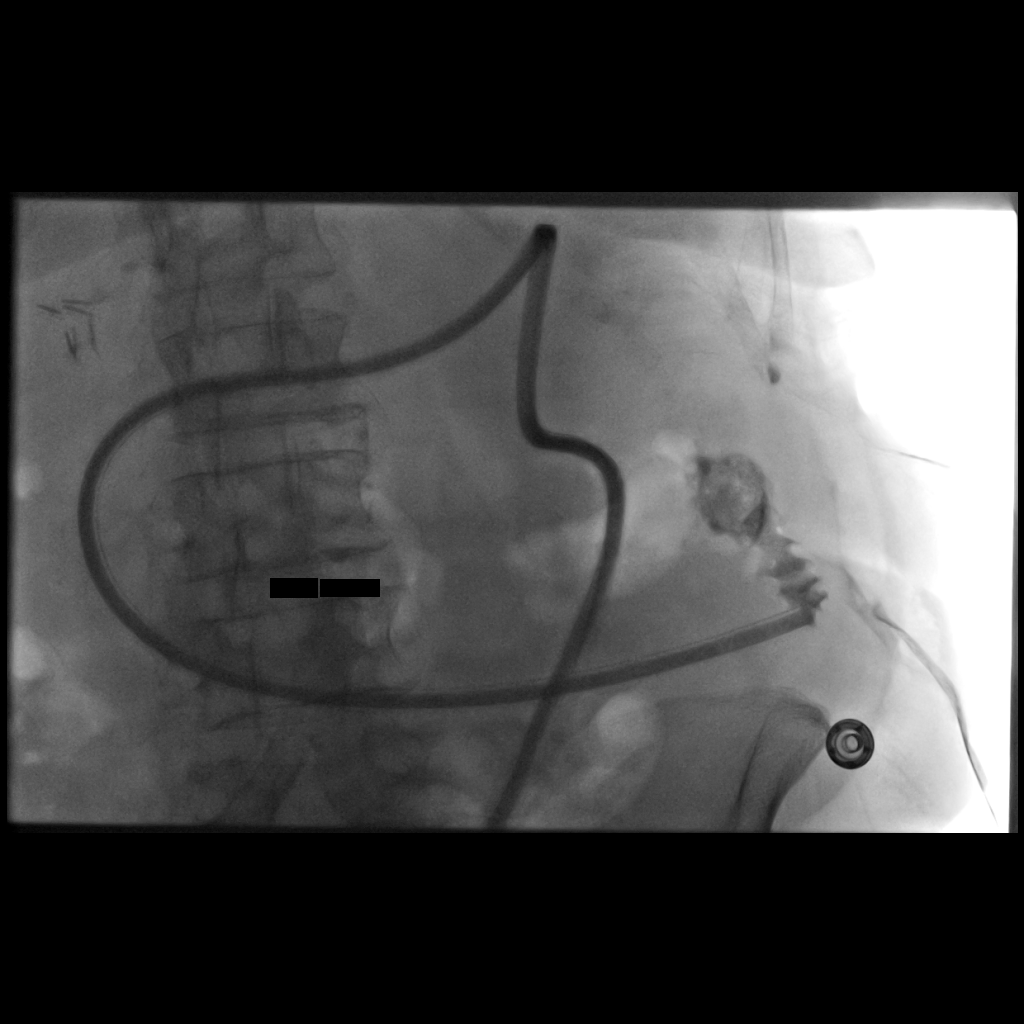

[1 of 1 positions shown; findings below may reference images not displayed]

EXAM:
GASTROJEJUNOSTOMY TUBE EXCHANGE

MEDICATIONS:
None; Antibiotics were administered within 1 hour of the procedure.
Glucagon 1 mg IV

ANESTHESIA/SEDATION:
None.

CONTRAST:  5 cc Jsovue-ZPP - administered into the gastric lumen.

FLUOROSCOPY TIME:  Fluoroscopy Time: 0 minutes 12 seconds (1.3 mGy).

COMPLICATIONS:
None immediate.

PROCEDURE:
Informed written consent was obtained from the patient after a
thorough discussion of the procedural risks, benefits and
alternatives. All questions were addressed. Maximal Sterile Barrier
Technique was utilized including caps, mask, sterile gowns, sterile
gloves, sterile drape, hand hygiene and skin antiseptic. A timeout
was performed prior to the initiation of the procedure.

The existing gastrojejunostomy tube was exchanged for a new 22
French gastrojejunostomy tube over a stiff glidewire. The securing
balloon was insufflated with 10 cc saline. Contrast was injected.
FINDINGS: The tip of the new gastrojejunostomy tube is positioned in the
proximal jejunum.
IMPRESSION: Successful 22 French gastrojejunostomy tube exchange.

## 2018-11-29 NOTE — Patient Instructions (Signed)
Description   Pt's daughter present. Continue taking 2 tablets daily except 3 tablets on Sundays. Recheck in 4 weeks.  Call [facility] with any new medications [phone].

## 2018-12-02 ENCOUNTER — Ambulatory Visit (INDEPENDENT_AMBULATORY_CARE_PROVIDER_SITE_OTHER): Payer: Medicare Other | Admitting: Nurse Practitioner

## 2018-12-02 ENCOUNTER — Other Ambulatory Visit: Payer: Self-pay

## 2018-12-02 ENCOUNTER — Encounter: Payer: Self-pay | Admitting: Nurse Practitioner

## 2018-12-02 VITALS — BP 144/62 | HR 72 | Temp 98.2°F | Ht 66.0 in | Wt 145.0 lb

## 2018-12-02 DIAGNOSIS — I10 Essential (primary) hypertension: Secondary | ICD-10-CM | POA: Diagnosis not present

## 2018-12-02 DIAGNOSIS — J0140 Acute pansinusitis, unspecified: Secondary | ICD-10-CM | POA: Diagnosis not present

## 2018-12-02 DIAGNOSIS — B370 Candidal stomatitis: Secondary | ICD-10-CM

## 2018-12-02 DIAGNOSIS — R55 Syncope and collapse: Secondary | ICD-10-CM | POA: Diagnosis not present

## 2018-12-02 DIAGNOSIS — N183 Chronic kidney disease, stage 3 unspecified: Secondary | ICD-10-CM

## 2018-12-02 DIAGNOSIS — J449 Chronic obstructive pulmonary disease, unspecified: Secondary | ICD-10-CM

## 2018-12-02 DIAGNOSIS — D638 Anemia in other chronic diseases classified elsewhere: Secondary | ICD-10-CM

## 2018-12-02 DIAGNOSIS — Z23 Encounter for immunization: Secondary | ICD-10-CM | POA: Diagnosis not present

## 2018-12-02 DIAGNOSIS — I4819 Other persistent atrial fibrillation: Secondary | ICD-10-CM

## 2018-12-02 MED ORDER — NYSTATIN 100000 UNIT/ML MT SUSP: 5.0000 mL | Freq: Four times a day (QID) | OROMUCOSAL | 0 refills | Status: DC

## 2018-12-02 MED ORDER — AZITHROMYCIN 250 MG PO TABS: ORAL_TABLET | ORAL | 0 refills | Status: DC

## 2018-12-02 NOTE — Patient Instructions (Signed)
To continue to use nettipot once daily Cont flonase after nettipot Rx for azithromycin sent to pharmacy  To continue to use nystatin oral swish and spit for the next week To change out toothbrush after completed.   To monitor blood pressure AFTER medication- should be taken 1 hour after medication- make sure you are sitting. Goal BP <140/90

## 2018-12-02 NOTE — Progress Notes (Signed)
Careteam: Patient Care Team: [name], NP as PCP - General (Geriatric Medicine) Dorothy Spark, MD as PCP - Cardiology (Cardiology) Edrick Kins, DPM as Consulting Physician (Podiatry) Dorothy Spark, MD as Consulting Physician (Cardiology) [name], NP as Nurse Practitioner (Geriatric Medicine) Camillo Flaming, [place] as Referring Physician (Optometry) Tobi Bastos, RN as [place] Management  Advanced Directive information    Allergies  Allergen Reactions   Penicillins Itching, Rash and Other (See Comments)    Tolerated amoxicillin, unasyn, zosyn & cephalosporins in the past. Did it involve swelling of the face/tongue/throat, SOB, or low BP? No Did it involve sudden or severe rash/hives, skin peeling, or any reaction on the inside of your mouth or nose? No Did you need to seek medical attention at a hospital or doctor's office? Unknown When did it last happen?5+ years If all above answers are "NO", may proceed with cephalosporin use.     Chief Complaint  Patient presents with   Transitions Of Care    TOC follow-up, patient at [facility] 11/24/2018-11/26/2018 for syncope and collapse episode    Thrush    Examine mouth for thrush, refill nystatin solution if needed    Sinus Problem    Sinus infection- stopped up, neck soreness, eye sensitive, and drainage at times.    Immunizations    Discuss if ok to get flu vaccine      HPI: Patient is a 85 y.o. female seen in the office today for hospital follow up.  medical history significant forchronic diastolic CHF, paroxysmal atrial fibrillation on Coumadin,gastric outlet obstruction s/p gastrojejunostomy feeding tube placement,nonobstructive CAD, history of CVA, COPD, CKD stage III, insulin-dependent type 2 diabetes, hypertension, hyperlipidemia, anemia of chronic disease, hypothyroidism, and depression who presents to the ED for evaluation of a syncopal event at home.  She was found to have low heart rate.  Coreg was stopped due to bradycardia, no rapid heart rate noted after stopped. Continues to monitor at home.  Blood pressure slightly elevated today but around baseline per daughter. No lower leg edema noted.  No additional episodes.   Having sinus issues- gums hurt, eyes hurt, increase pressure in sinuses that continues to get worse. Called the office and she started zyrtec 10 mg by mouth daily- this did not help. She has been having symptoms for over a month and progressively getting worse. In ED physician suggested netipot.   A fib- rate controlled INR 2.2 on Friday.   Review of Systems:  Review of Systems  Constitutional: Negative for chills, fever and weight loss.  HENT: Positive for congestion, ear pain and sinus pain. Negative for sore throat.   Respiratory: Negative for cough and shortness of breath.   Cardiovascular: Negative for chest pain.  Gastrointestinal: Negative for abdominal pain, blood in stool, constipation, diarrhea, heartburn, melena, nausea and vomiting.  Genitourinary: Negative for dysuria and urgency.  Musculoskeletal: Positive for joint pain and myalgias.  Skin: Negative for itching and rash.  Neurological: Positive for sensory change. Negative for dizziness and headaches.  Psychiatric/Behavioral: Negative for depression and memory loss. The patient is not nervous/anxious and does not have insomnia.     Past Medical History:  Diagnosis Date   Acute respiratory failure ([place])    Anemia    previous blood transfusions   Arthritis    "all over"   Asthma    Atrial flutter (HCC)    Bradycardia    requiring previous d/c of BB and reduction  of amiodarone   CAD (coronary artery disease)    nonobstructive per notes   Chronic diastolic CHF (congestive heart failure) ([place])    CKD (chronic kidney disease), stage III ([place])    Complication of blood transfusion    "got the wrong blood type at [facility] in ~ 2015; no  adverse reaction that we are aware of"/daughter, [name] (01/27/2016)   COPD (chronic obstructive pulmonary disease) ([place])    Depression    "light case"   DVT (deep venous thrombosis) ([place]) 01/2016   a. LLE DVT 01/2016 - switched from Eliquis to Coumadin.   Dyspnea    with some activity   Gastric stenosis    a. s/p stomach tube   GERD (gastroesophageal reflux disease)    History of blood transfusion    "several" (01/27/2016)   History of stomach ulcers    Hyperlipidemia    Hypertension    Hypothyroidism    On home oxygen therapy    "2L; 9PM til 9AM" (06/27/2017)   PAD (peripheral artery disease) (HCC)    a. prior gangrene, toe amputations, intervention.   PAF (paroxysmal atrial fibrillation) ([place])    Paraesophageal hernia    Perforated gastric ulcer ([place])    Pneumonia    "a few times" (06/27/2017)   Seasonal allergies    SIADH (syndrome of inappropriate ADH production) ([place])    [name] 01/10/2015   Small bowel obstruction ([place])    "I don't know how many" (01/11/2015)   Stroke ([place])    several- no residual   Type II diabetes mellitus ([place])    "related to prednisone use  for > 20 yr; once predinose stopped; no more DM RX" (01/27/2016)   UTI (urinary tract infection) 02/08/2016   Ventral hernia with bowel obstruction    Past Surgical History:  Procedure Laterality Date   ABDOMINAL AORTOGRAM N/A 09/21/2016   Procedure: Abdominal Aortogram;  Surgeon: Waynetta Sandy, MD;  Location: [place] [facility];  Service: Cardiovascular;  Laterality: N/A;   AMPUTATION Left 09/25/2016   Procedure: AMPUTATION DIGIT- LEFT 2ND AND 3RD TOES;  Surgeon: Rosetta Posner, MD;  Location: [facility] OR;  Service: Vascular;  Laterality: Left;   AMPUTATION Right 05/03/2018   Procedure: AMPUTATION RIGHT GREAT TOE;  Surgeon: Rosetta Posner, MD;  Location: [facility] OR;  Service: Vascular;  Laterality: Right;   CATARACT EXTRACTION W/ INTRAOCULAR LENS  IMPLANT, BILATERAL     CHOLECYSTECTOMY  OPEN     COLECTOMY     ESOPHAGOGASTRODUODENOSCOPY N/A 01/19/2014   Procedure: ESOPHAGOGASTRODUODENOSCOPY (EGD);  Surgeon: Irene Shipper, MD;  Location: [facility];  Service: Endoscopy;  Laterality: N/A;   ESOPHAGOGASTRODUODENOSCOPY N/A 01/20/2014   Procedure: ESOPHAGOGASTRODUODENOSCOPY (EGD);  Surgeon: Irene Shipper, MD;  Location: [facility];  Service: Endoscopy;  Laterality: N/A;   ESOPHAGOGASTRODUODENOSCOPY N/A 03/19/2014   Procedure: ESOPHAGOGASTRODUODENOSCOPY (EGD);  Surgeon: Milus Banister, MD;  Location: [facility];  Service: Endoscopy;  Laterality: N/A;   ESOPHAGOGASTRODUODENOSCOPY N/A 07/08/2015   Procedure: ESOPHAGOGASTRODUODENOSCOPY (EGD);  Surgeon: Doran Stabler, MD;  Location: [facility] ENDOSCOPY;  Service: Endoscopy;  Laterality: N/A;   ESOPHAGOGASTRODUODENOSCOPY (EGD) WITH PROPOFOL N/A 09/15/2015   Procedure: ESOPHAGOGASTRODUODENOSCOPY (EGD) WITH PROPOFOL;  Surgeon: Manus Gunning, MD;  Location: [facility] ENDOSCOPY;  Service: Gastroenterology;  Laterality: N/A;   GASTROJEJUNOSTOMY     hx/notes 01/10/2015   GASTROJEJUNOSTOMY N/A 09/23/2015   Procedure: OPEN GASTROJEJUNOSTOMY TUBE PLACEMENT;  Surgeon: Arta Bruce Kinsinger, MD;  Location: [facility] ORS;  Service: General;  Laterality: N/A;  GLAUCOMA SURGERY Bilateral    HERNIA REPAIR  2015   IR CM INJ ANY COLONIC TUBE W/FLUORO  01/05/2017   IR CM INJ ANY COLONIC TUBE W/FLUORO  06/07/2017   IR CM INJ ANY COLONIC TUBE W/FLUORO  11/05/2017   IR CM INJ ANY COLONIC TUBE W/FLUORO  09/18/2018   IR GENERIC HISTORICAL  01/07/2016   IR GJ TUBE CHANGE 01/07/2016 Jacqulynn Cadet, MD [facility]   IR GENERIC HISTORICAL  01/27/2016   IR MECH REMOV OBSTRUC MAT ANY COLON TUBE W/FLUORO 01/27/2016 Markus Daft, MD [facility]   IR GENERIC HISTORICAL  02/07/2016   IR PATIENT EVAL TECH 0-60 MINS Darrell K Allred, PA-C [facility]   IR GENERIC HISTORICAL  02/08/2016   IR GJ TUBE CHANGE 02/08/2016 Greggory Keen, MD [facility]   IR GENERIC  HISTORICAL  01/06/2016   IR GJ TUBE CHANGE 01/06/2016 [facility] OUT REF   IR GENERIC HISTORICAL  05/02/2016   IR CM INJ ANY COLONIC TUBE W/FLUORO 05/02/2016 Arne Cleveland, MD [facility]   IR GENERIC HISTORICAL  05/15/2016   IR GJ TUBE CHANGE 05/15/2016 Sandi Mariscal, MD [facility]   IR GENERIC HISTORICAL  06/28/2016   IR GJ TUBE CHANGE 06/28/2016 [facility]   IR GJ TUBE CHANGE  02/20/2017   IR GJ TUBE CHANGE  05/10/2017   IR GJ TUBE CHANGE  07/06/2017   IR GJ TUBE CHANGE  08/02/2017   IR GJ TUBE CHANGE  10/15/2017   IR GJ TUBE CHANGE  12/26/2017   IR GJ TUBE CHANGE  04/08/2018   IR GJ TUBE CHANGE  06/19/2018   IR PATIENT EVAL TECH 0-60 MINS  10/19/2016   IR PATIENT EVAL TECH 0-60 MINS  12/25/2016   IR PATIENT EVAL TECH 0-60 MINS  01/29/2017   IR PATIENT EVAL TECH 0-60 MINS  04/04/2017   IR PATIENT EVAL TECH 0-60 MINS  04/30/2017   IR PATIENT EVAL TECH 0-60 MINS  07/31/2017   IR REPLC DUODEN/JEJUNO TUBE PERCUT W/FLUORO  11/14/2016   LAPAROTOMY N/A 01/20/2015   Procedure: EXPLORATORY LAPAROTOMY;  Surgeon: Coralie Keens, MD;  Location: [facility];  Service: General;  Laterality: N/A;   LOWER EXTREMITY ANGIOGRAPHY Left 09/21/2016   Procedure: Lower Extremity Angiography;  Surgeon: Waynetta Sandy, MD;  Location: [place] [facility] LAB;  Service: Cardiovascular;  Laterality: Left;   LOWER EXTREMITY ANGIOGRAPHY Right 06/27/2017   Procedure: Lower Extremity Angiography;  Surgeon: Waynetta Sandy, MD;  Location: [place] [facility];  Service: Cardiovascular;  Laterality: Right;   LYSIS OF ADHESION N/A 01/20/2015   Procedure: LYSIS OF ADHESIONS < 1 HOUR;  Surgeon: Coralie Keens, MD;  Location: [facility] [place];  Service: General;  Laterality: N/A;   PERIPHERAL VASCULAR BALLOON ANGIOPLASTY Left 09/21/2016   Procedure: Peripheral Vascular Balloon Angioplasty;  Surgeon: Waynetta Sandy, MD;  Location: [place] [facility];  Service: Cardiovascular;  Laterality: Left;  drug coated  balloon   PERIPHERAL VASCULAR BALLOON ANGIOPLASTY Right 06/27/2017   Procedure: PERIPHERAL VASCULAR BALLOON ANGIOPLASTY;  Surgeon: Waynetta Sandy, MD;  Location: [place] [facility];  Service: Cardiovascular;  Laterality: Right;  SFA/TPTRUNK   TONSILLECTOMY     TUBAL LIGATION     VENTRAL HERNIA REPAIR  2015   incarcerated ventral hernia ([facility] 09/2013)/notes 01/10/2015   Social History:   reports that she has never smoked. She quit smokeless tobacco use about 38 years ago.  Her smokeless tobacco use included snuff. She reports that she does not drink alcohol or  use drugs.  Family History  Problem Relation Age of Onset   Stroke Mother    Hypertension Mother    Diabetes Brother    Glaucoma Son    Glaucoma Son    Heart attack Neg Hx    Colon cancer Neg Hx    Stomach cancer Neg Hx     Medications: Patient's Medications  New Prescriptions   No medications on file  Previous Medications   ACETAMINOPHEN (TYLENOL) 500 MG TABLET    Take 500 mg by mouth every 6 (six) hours as needed (pain).    AMINO ACIDS-PROTEIN HYDROLYS (FEEDING SUPPLEMENT, PRO-STAT SUGAR FREE 64,) LIQD    Take 30 mLs by mouth 2 (two) times daily.   AMLODIPINE (NORVASC) 5 MG TABLET    Take 1 tablet (5 mg total) by mouth daily.   ATORVASTATIN (LIPITOR) 10 MG TABLET    Take 1 tablet (10 mg total) by mouth daily.   B COMPLEX VITAMINS TABLET    Take 1 tablet by mouth daily.    CALCIUM-VITAMIN D (OSCAL WITH D) 500-200 MG-UNIT PER TABLET    Take 1 tablet by mouth daily with breakfast.    CARBOXYMETHYLCELLULOSE SODIUM (ARTIFICIAL TEARS OP)    Place 1 drop into both eyes 4 (four) times daily.   COLLAGENASE (SANTYL) OINTMENT    Apply 1 application topically daily as needed (wound care).    CYCLOSPORINE (RESTASIS) 0.05 % OPHTHALMIC EMULSION    USE 1 DROP INTO BOTH EYES TWICE DAILY   FEEDING SUPPLEMENT (OSMOLITE 1 CAL) LIQD    Take 711 mLs by mouth See admin instructions. For 12 hours 2100 - 0900   FERROUS SULFATE  325 (65 FE) MG TABLET    TAKE 1 TABLET BY MOUTH EVERY DAY   FLUTICASONE (FLONASE) 50 MCG/ACT NASAL SPRAY    SPRAY 2 SPRAYS INTO EACH NOSTRIL EVERY DAY   FUROSEMIDE (LASIX) 40 MG TABLET    TAKE 1 TABLET BY MOUTH EVERY DAY   GABAPENTIN (NEURONTIN) 300 MG CAPSULE    TAKE 1 CAPSULE BY MOUTH TWICE A DAY   GENTAMICIN CREAM (GARAMYCIN) 0.1 %    Apply 1 application topically 3 (three) times daily as needed (dry skin).    HYDRALAZINE (APRESOLINE) 50 MG TABLET    TAKE 1 TABLET BY MOUTH THREE TIMES A DAY   INSULIN ASPART (NOVOLOG) 100 UNIT/ML FLEXPEN    Inject 0-6 Units into the skin 3 (three) times daily with meals. New sliding Scale If BS is 151 - 251 = 2 units,  If BS is 251 -  350 = 4 units,  If BS is 351 - 450 = 6 units Call provider if BS runs more than 400   INSULIN GLARGINE (LANTUS) 100 UNIT/ML INJECTION    Inject 8 Units into the skin at bedtime.    INSULIN PEN NEEDLE (B-D UF III MINI PEN NEEDLES) 31G X 5 MM MISC    Use twice daily with giving insulin injections. Dx E11.9   IPRATROPIUM-ALBUTEROL (DUONEB) 0.5-2.5 (3) MG/3ML SOLN    Take 3 mLs by nebulization every 6 (six) hours as needed (sob). Use 3 times daily x 4 days then every 6 hours as needed.   ISOSORBIDE MONONITRATE (IMDUR) 30 MG 24 HR TABLET    Take 2 tablets (60 mg total) by mouth daily. Please make annual appt with Dr. Meda Coffee for future refills. Thank you. 1st attempt.   LATANOPROST (XALATAN) 0.005 % OPHTHALMIC SOLUTION    Place 1 drop into both eyes at bedtime.  LEVALBUTEROL (XOPENEX HFA) 45 MCG/ACT INHALER    Inhale 2 puffs into the lungs every 6 (six) hours as needed for wheezing or shortness of breath.   LEVOTHYROXINE (SYNTHROID, LEVOTHROID) 125 MCG TABLET    Take 1 tablet (125 mcg total) by mouth daily.   LORATADINE (CLARITIN) 10 MG TABLET    Take 10 mg by mouth daily as needed for allergies.   MOMETASONE-FORMOTEROL (DULERA) 200-5 MCG/ACT AERO    TAKE TWO PUFFS BY MOUTH TWICE DAILY   MULTIPLE VITAMIN (MULTIVITAMIN WITH MINERALS) TABS  TABLET    Take 1 tablet by mouth daily.   NYSTATIN (MYCOSTATIN) 100000 UNIT/ML SUSPENSION    Take 5 mLs (500,000 Units total) by mouth 4 (four) times daily.   NYSTATIN (MYCOSTATIN/NYSTOP) POWDER    Apply 1 g topically as needed (skin irritation).    OXYCODONE (ROXICODONE) 5 MG IMMEDIATE RELEASE TABLET    Take 1 tablet (5 mg total) by mouth every 4 (four) hours as needed for severe pain. May take 1/2 tab q4hrs as needed for mild pain.   OXYGEN    Inhale 2 L into the lungs at bedtime.   PANTOPRAZOLE (PROTONIX) 40 MG TABLET    Take 1 tablet (40 mg total) by mouth 2 (two) times daily.   SERTRALINE (ZOLOFT) 100 MG TABLET    TAKE 1 TABLET BY MOUTH EVERY DAY FOR DEPRESSION   WARFARIN (COUMADIN) 2.5 MG TABLET    TAKE AS DIRECTED BY COUMADIN CLINIC.   WATER FOR IRRIGATION, STERILE (FREE WATER) SOLN    Place 150 mLs into feeding tube 3 (three) times daily.    WHITE PETROLATUM-MINERAL OIL (SYSTANE NIGHTTIME) OINT    Place 1 application into both eyes at bedtime.   Modified Medications   No medications on file  Discontinued Medications   No medications on file    Physical Exam:  Vitals:   12/02/18 1501  BP: (!) 144/62  Pulse: 72  Temp: 98.2 F (36.8 C)  TempSrc: Temporal  SpO2: 97%  Weight: 145 lb (65.8 kg)  Height: 5\' 6"  (1.676 m)   Body mass index is 23.4 kg/m. Wt Readings from Last 3 Encounters:  12/02/18 145 lb (65.8 kg)  11/26/18 140 lb 10.5 oz (63.8 kg)  09/03/18 145 lb (65.8 kg)    Physical Exam Constitutional:      Appearance: Normal appearance. She is well-developed.  HENT:     Head: Normocephalic and atraumatic.     Nose: Congestion and rhinorrhea present.     Mouth/Throat:     Mouth: Mucous membranes are dry.  Eyes:     Pupils: Pupils are equal, round, and reactive to light.  Neck:     Musculoskeletal: Normal range of motion and neck supple.  Cardiovascular:     Rate and Rhythm: Normal rate and regular rhythm.     Heart sounds: Murmur present.  Pulmonary:      Effort: Pulmonary effort is normal.     Breath sounds: Normal breath sounds.  Abdominal:     General: Bowel sounds are normal.     Palpations: Abdomen is soft.     Comments: PEG tube  Musculoskeletal:     Right lower leg: No edema.     Left lower leg: No edema.  Skin:    General: Skin is warm and dry.     Findings: No erythema.     Comments: Right toes covered in bandage and with orthotic in place; wearing orthotic boot  Neurological:     Mental  Status: She is alert and oriented to person, place, and time.     Labs reviewed: Basic Metabolic Panel: Recent Labs    04/17/18 1526  10/15/18 1122 11/24/18 2036 11/25/18 0530  NA 141   < > 138 136 140  K 4.4   < > 4.7 4.4 4.2  CL 101   < > 101 99 105  CO2 34*   < > 31 26 26   GLUCOSE 86   < > 100* 86 106*  BUN 68*   < > 68* 73* 70*  CREATININE 1.49*   < > 1.57* 1.56* 1.53*  CALCIUM 9.9   < > 10.1 9.6 9.7  TSH 0.15*  --  0.98  --   --    < > = values in this interval not displayed.   Liver Function Tests: Recent Labs    05/03/18 1055  08/27/18 1100 10/15/18 1122 11/24/18 2036  AST 42*   < > 22 23 32  ALT 34   < > 14 14 20   ALKPHOS 59  --   --   --  71  BILITOT 1.0   < > 0.3 0.3 0.5  PROT 7.6   < > 7.7 7.9 7.6  ALBUMIN 3.1*  --   --   --  3.8   < > = values in this interval not displayed.   Recent Labs    08/27/18 1100  LIPASE 24  AMYLASE 60   No results for input(s): AMMONIA in the last 8760 hours. CBC: Recent Labs    08/27/18 1100 10/15/18 1122 11/24/18 2036 11/25/18 0530  WBC 5.1 4.7 6.5 4.7  NEUTROABS 3,019 2,797 4.2  --   HGB 9.7* 10.3* 9.6* 9.0*  HCT 30.0* 31.5* 29.7* 27.5*  MCV 93.5 93.8 97.4 96.5  PLT 191 209 201 190   Lipid Panel: Recent Labs    10/15/18 1122  CHOL 131  HDL 47*  LDLCALC 58  TRIG 188*  CHOLHDL 2.8   TSH: Recent Labs    04/17/18 1526 10/15/18 1122  TSH 0.15* 0.98   A1C: Lab Results  Component Value Date   HGBA1C 6.3 (H) 10/15/2018     Assessment/Plan 1.  Acute non-recurrent pansinusitis -to continue to use netipot daily with flonase - azithromycin (ZITHROMAX) 250 MG tablet; 2 tablet today and 1 tablet daily until complete  Dispense: 6 tablet; Refill: 0 -to notify coumadin clinic due to start of antibiotic.   2. Oral candidiasis -improved at this time however due to prescribing antibiotic will send in refill. Encouraged to rinse/brush gums after using inhalers. To change tooth brush pri - nystatin (MYCOSTATIN) 100000 UNIT/ML suspension; Take 5 mLs (500,000 Units total) by mouth 4 (four) times daily.  Dispense: 60 mL; Refill: 0  3. Chronic kidney insufficiency, stage 3 (moderate) (HCC) - BASIC METABOLIC PANEL WITH GFR  4. COPD with asthma ([place]) Stable on dulera twice daily, to swish/brush and rinse out after use.   5. Anemia of chronic disease - CBC with Differential/Platelet  6. Need for influenza vaccination - Flu Vaccine QUAD High Dose(Fluad)  7. Syncope, unspecified syncope type -no further episodes. Continues to be off betablocker.  Suspected due to bradycardiac episode. CT head was negative during hospitalization, she is at baseline at this time and without palpitations.   8. Persistent atrial fibrillation Rate controlled without palpitations or elevated HR from home readings or today. Continue to monitor and notify for elevated HR. Continues on coumadin.   9. Essential hypertension, benign -to  continue on norvasc, slightly elevated today. Goal <140/90. Will continue to monitor at home and notify if remains elevated, may need to increase norvasc.    Next appt: to keep 02/11/2019 scheduled appt. Sooner if needed  [employer]. [place], [facility] [phone]

## 2018-12-03 LAB — CBC WITH DIFFERENTIAL/PLATELET
Absolute Monocytes: 747 cells/uL (ref 200–950)
Basophils Absolute: 51 cells/uL (ref 0–200)
Basophils Relative: 0.9 %
Eosinophils Absolute: 257 cells/uL (ref 15–500)
Eosinophils Relative: 4.5 %
HCT: 29.2 % — ABNORMAL LOW (ref 35.0–45.0)
Hemoglobin: 9.6 g/dL — ABNORMAL LOW (ref 11.7–15.5)
Lymphs Abs: 1015 cells/uL (ref 850–3900)
MCH: 31.2 pg (ref 27.0–33.0)
MCHC: 32.9 g/dL (ref 32.0–36.0)
MCV: 94.8 fL (ref 80.0–100.0)
MPV: 12.2 fL (ref 7.5–12.5)
Monocytes Relative: 13.1 %
Neutro Abs: 3631 cells/uL (ref 1500–7800)
Neutrophils Relative %: 63.7 %
Platelets: 225 10*3/uL (ref 140–400)
RBC: 3.08 10*6/uL — ABNORMAL LOW (ref 3.80–5.10)
RDW: 14.9 % (ref 11.0–15.0)
Total Lymphocyte: 17.8 %
WBC: 5.7 10*3/uL (ref 3.8–10.8)

## 2018-12-03 LAB — BASIC METABOLIC PANEL WITH GFR
BUN/Creatinine Ratio: 50 (calc) — ABNORMAL HIGH (ref 6–22)
BUN: 77 mg/dL — ABNORMAL HIGH (ref 7–25)
CO2: 30 mmol/L (ref 20–32)
Calcium: 9.9 mg/dL (ref 8.6–10.4)
Chloride: 100 mmol/L (ref 98–110)
Creat: 1.53 mg/dL — ABNORMAL HIGH (ref 0.60–0.88)
GFR, Est African American: 34 mL/min/{1.73_m2} — ABNORMAL LOW (ref >=60)
GFR, Est Non African American: 29 mL/min/{1.73_m2} — ABNORMAL LOW (ref >=60)
Glucose, Bld: 118 mg/dL (ref 65–139)
Potassium: 4.3 mmol/L (ref 3.5–5.3)
Sodium: 137 mmol/L (ref 135–146)

## 2018-12-05 IMAGING — US US RENAL
1 series · 14 of 25 positions shown · non-contrast
Comparison: CT 02/20/2017 .

CLINICAL DATA: Acute renal failure.

EXAM:
RENAL / URINARY TRACT ULTRASOUND COMPLETE

[Series 1: us renal · 0.18mm/px · 14 of 37 slices shown]
[im 1/37]
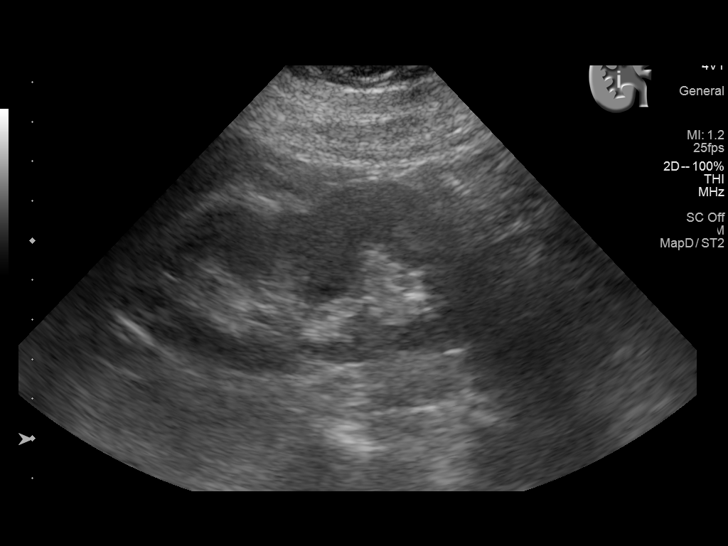
[im 4/37]
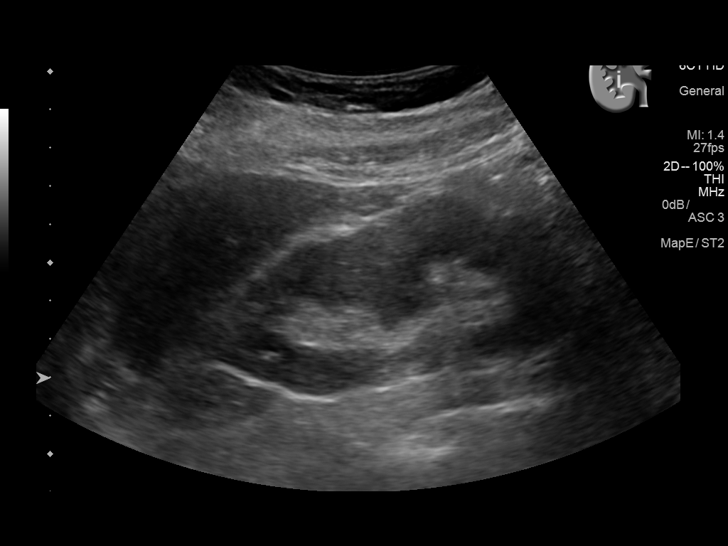
[im 7/37]
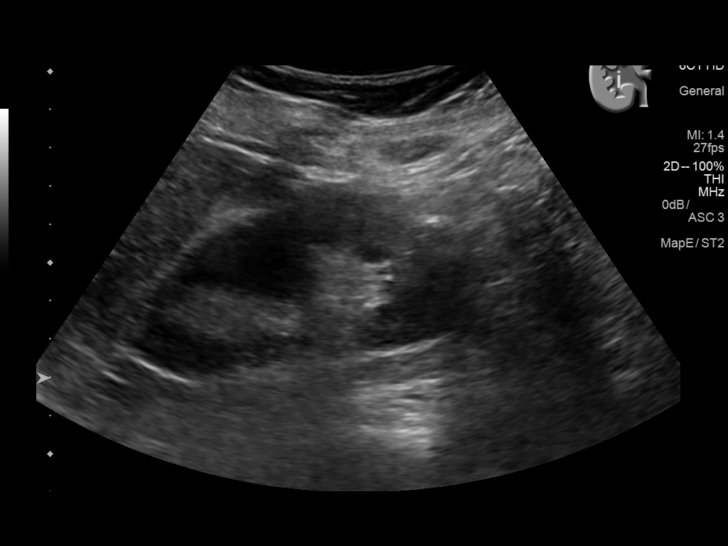
[im 10/37]
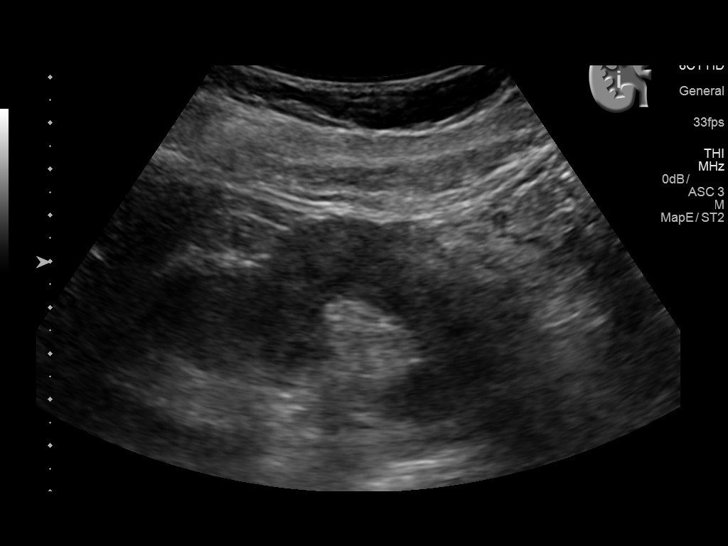
[im 13/37]
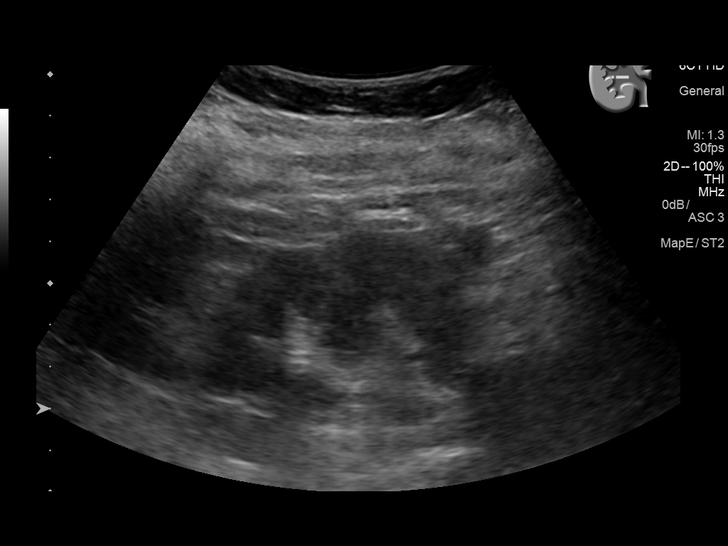
[im 14/37]
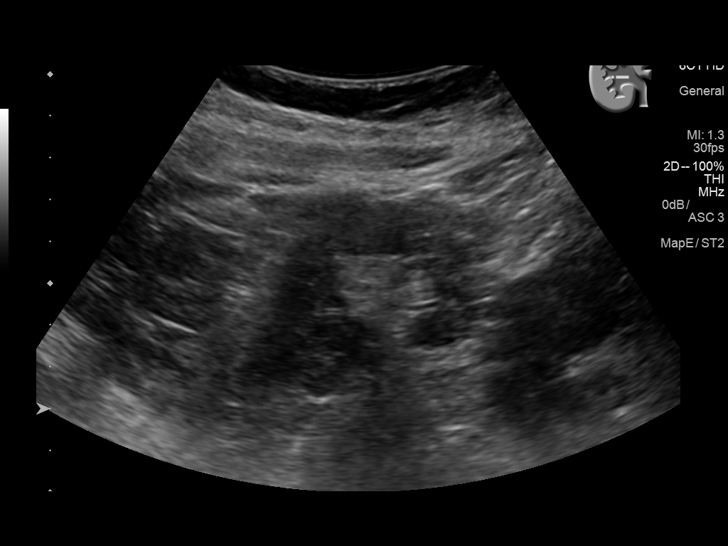
[im 17/37]
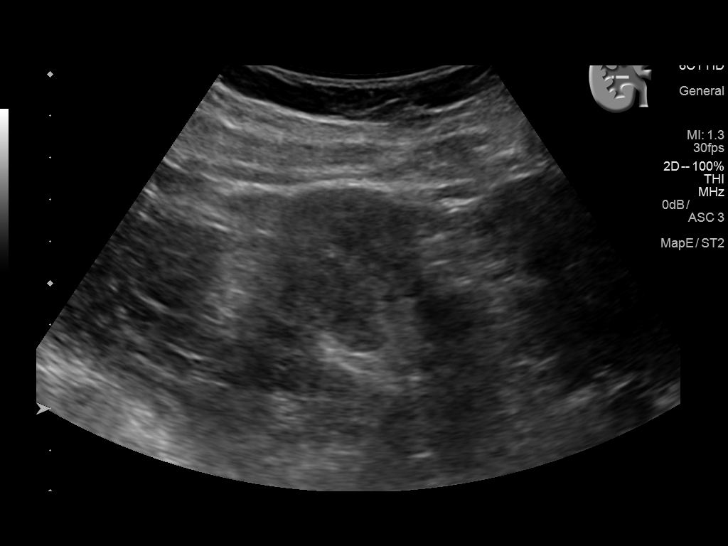
[im 20/37]
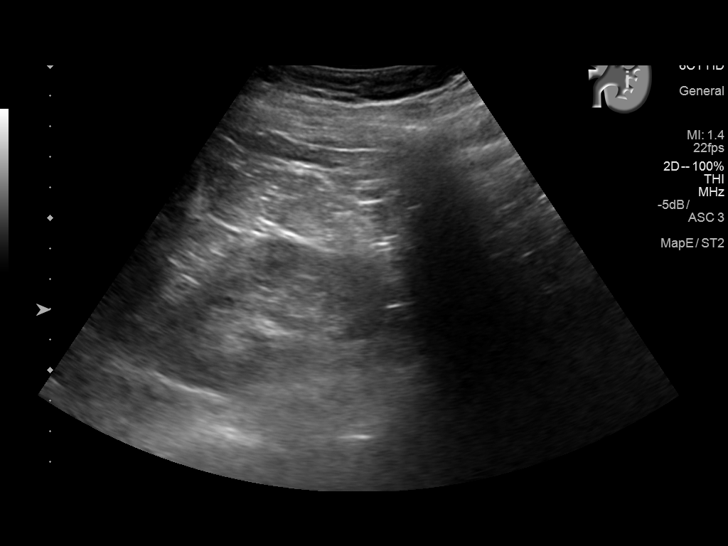
[im 23/37]
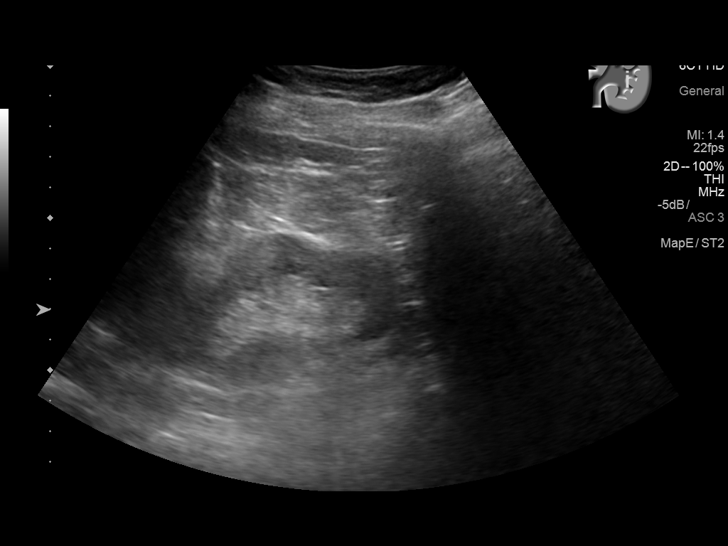
[im 25/37]
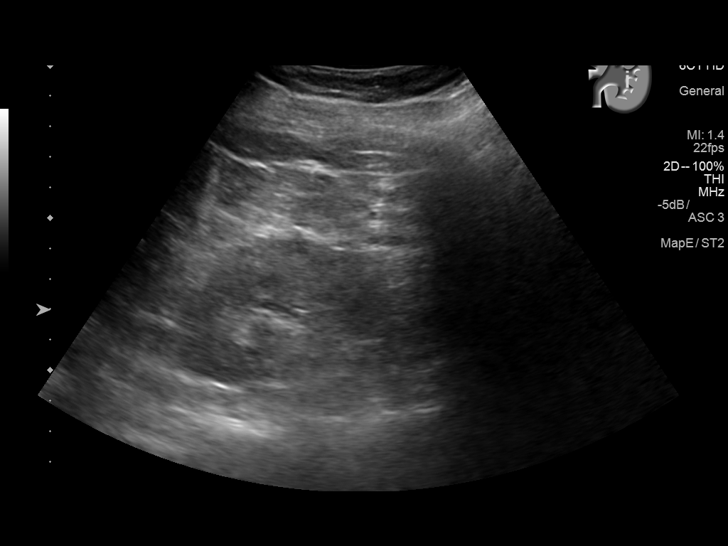
[im 28/37]
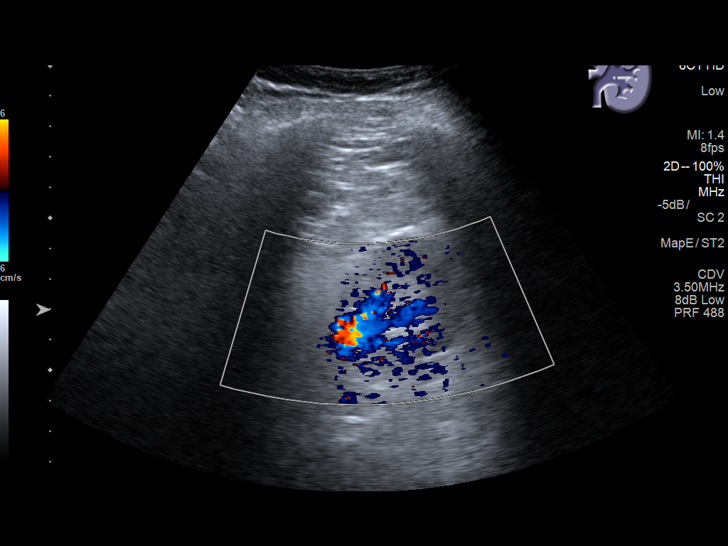
[im 31/37]
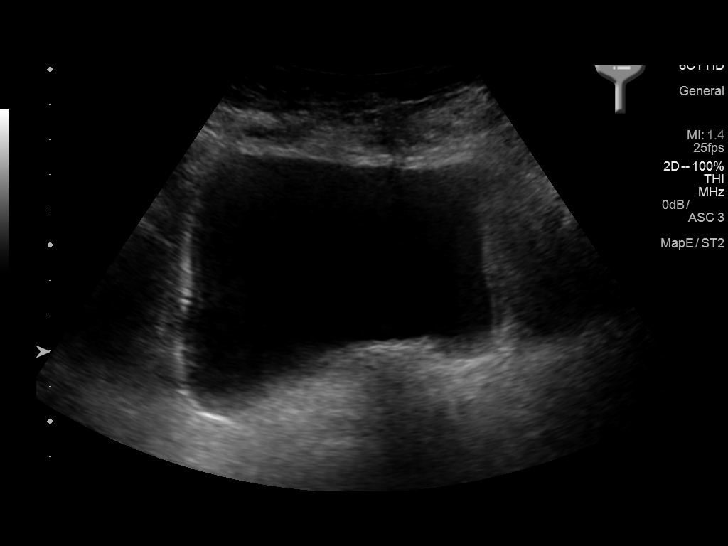
[im 34/37]
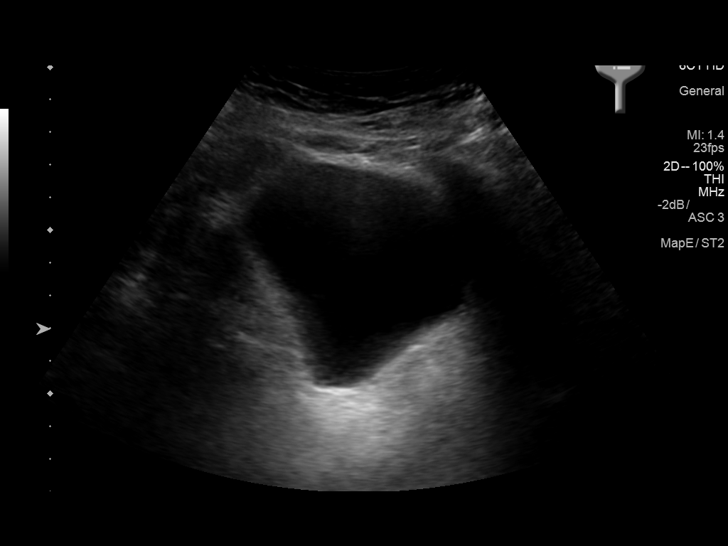
[im 37/37]
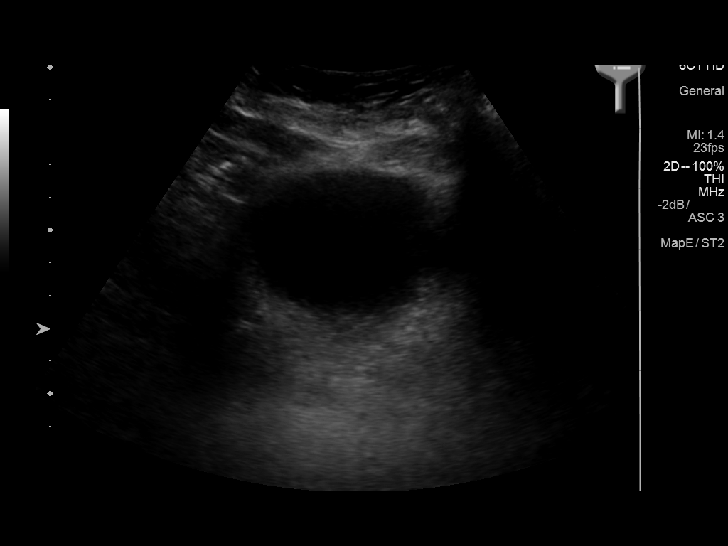

[14 of 25 positions shown; findings below may reference images not displayed]

FINDINGS: Right Kidney:

Length: 8.8 cm. Echogenicity within normal limits. Lobular contour.
No mass or hydronephrosis visualized.

Left Kidney:

Length: 8.4 cm. Echogenicity within normal limits. Lobular contour.
No mass or hydronephrosis visualized.

Bladder:

Appears normal for degree of bladder distention.
IMPRESSION: 1. Bilateral small kidneys suggesting some degree of atrophy.
Bilateral renal scarring.

2. No acute abnormality.  No hydronephrosis or bladder distention .

## 2018-12-16 ENCOUNTER — Other Ambulatory Visit: Payer: Self-pay

## 2018-12-16 ENCOUNTER — Ambulatory Visit (INDEPENDENT_AMBULATORY_CARE_PROVIDER_SITE_OTHER): Payer: Medicare Other | Admitting: Podiatry

## 2018-12-16 ENCOUNTER — Encounter: Payer: Self-pay | Admitting: Podiatry

## 2018-12-16 DIAGNOSIS — H401130 Primary open-angle glaucoma, bilateral, stage unspecified: Secondary | ICD-10-CM | POA: Diagnosis not present

## 2018-12-16 DIAGNOSIS — L97512 Non-pressure chronic ulcer of other part of right foot with fat layer exposed: Secondary | ICD-10-CM

## 2018-12-16 DIAGNOSIS — E0859 Diabetes mellitus due to underlying condition with other circulatory complications: Secondary | ICD-10-CM

## 2018-12-16 DIAGNOSIS — I739 Peripheral vascular disease, unspecified: Secondary | ICD-10-CM

## 2018-12-16 DIAGNOSIS — E119 Type 2 diabetes mellitus without complications: Secondary | ICD-10-CM | POA: Diagnosis not present

## 2018-12-16 LAB — HM DIABETES EYE EXAM

## 2018-12-17 ENCOUNTER — Other Ambulatory Visit: Payer: Self-pay | Admitting: *Deleted

## 2018-12-17 NOTE — Patient Outreach (Addendum)
[facility] [facility]) Care Management  12/17/2018  Shannon [name] [birth year] [id number]    Telephone Assessment-HF  RN spoke with the daughter who was able to give report. States pt had a hospitalization "passed out and HR decreased". States this resulted in a change of pt's medications One was discontinued and one has been added Amlodipine. Pt feeling much better with no additional episodes. States pt will follow up with Dr. Meda Coffee on 9/30 and caregiver verified pt has communicated with her primary care provider ([name]) since her discharge. RN continue to encouraged adherence with all appointments and changes in pt's medications to avoid risk of readmission.  Plan of care discussed with goals and interventions updated accordingly as RN verified pt continues her daily weights staying within her range with no swelling or problems breathing. Verified understanding of recent changes in her medications and pt has started the suggestive medication changes with no encountered problems. Will re-evaluate this plan of care next month after pt has her visit with Dr. Meda Coffee to a health Coach if appropriated. Will continued to reiterate on available assistance with [facility] if needed for socia work or pharmacy needs (denies at this time).  Will also continue to encouraged adherence to the plan of care.  [name] CM Care Plan Problem One     Most Recent Value  Care Plan Problem One  Knowledge deficit related to HF  Role Documenting the Problem One  Care Management Coordinator  Care Plan for Problem One  Active  [name] Long Term Goal   Pt/fx will have an increased knowledge of HF management and what to do if acute symptoms should occurr with the next 90 days.  [name] Long Term Goal Start Date  08/07/18  Interventions for Problem One Long Term Goal  Will continue to verify pt remains in the GREEN with no precipitating symptoms. WIll continue to encourage ongoing monitoring with daily weights and reiterate on the  HF zone to intervene with the pt's provider with any signs of 3 lbs overnight or 5 lbs within one week with any symptoms encountered with swelling or SOB. All denied today. Will re-evaluate in a few weeks prior to transfer to a HC. Will offer any available assistance to caregiver to support pt's ongoing management of care.       [name], RN Care Management Coordinator [place] Office [phone]

## 2018-12-18 ENCOUNTER — Ambulatory Visit: Payer: Medicare Other | Admitting: Nurse Practitioner

## 2018-12-18 NOTE — Progress Notes (Signed)
Subjective:  85 y.o. female with PMHx of diabetes mellitus presenting today for follow up evaluation of ulcerations of the feet bilaterally. She states she is doing well overall. She states the wounds are relatively unchanged. She has been using Betadine ointment and the post op shoe as directed. There are no worsening factors noted. She denies any new complaints at this time. Patient is here for further evaluation and treatment.    Past Medical History:  Diagnosis Date  . Acute respiratory failure ([place])   . Anemia    previous blood transfusions  . Arthritis    "all over"  . Asthma   . Atrial flutter ([place])   . Bradycardia    requiring previous d/c of BB and reduction of amiodarone  . CAD (coronary artery disease)    nonobstructive per notes  . Chronic diastolic CHF (congestive heart failure) ([place])   . CKD (chronic kidney disease), stage III ([place])   . Complication of blood transfusion    "got the wrong blood type at [facility] in ~ 2015; no adverse reaction that we are aware of"/daughter, [name] (01/27/2016)  . COPD (chronic obstructive pulmonary disease) ([place])   . Depression    "light case"  . DVT (deep venous thrombosis) ([place]) 01/2016   a. LLE DVT 01/2016 - switched from Eliquis to Coumadin.  [name] Dyspnea    with some activity  . Gastric stenosis    a. s/p stomach tube  . GERD (gastroesophageal reflux disease)   . History of blood transfusion    "several" (01/27/2016)  . History of stomach ulcers   . Hyperlipidemia   . Hypertension   . Hypothyroidism   . On home oxygen therapy    "2L; 9PM til 9AM" (06/27/2017)  . PAD (peripheral artery disease) ([place])    a. prior gangrene, toe amputations, intervention.  [name] PAF (paroxysmal atrial fibrillation) ([place])   . Paraesophageal hernia   . Perforated gastric ulcer ([place])   . Pneumonia    "a few times" (06/27/2017)  . Seasonal allergies   . SIADH (syndrome of inappropriate ADH production) ([place])    [name] 01/10/2015  . Small bowel  obstruction ([place])    "I don't know how many" (01/11/2015)  . Stroke [facility])    several- no residual  . Type II diabetes mellitus ([place])    "related to prednisone use  for > 20 yr; once predinose stopped; no more DM RX" (01/27/2016)  . UTI (urinary tract infection) 02/08/2016  . Ventral hernia with bowel obstruction       Objective/Physical Exam General: The patient is alert and oriented x3 in no acute distress.  Dermatology:  Wounds noted to the 2, 3 and 4 digits of the right foot, all measuring 0.4 x 0.4 x 0.1 cm (LxWxD).   To the noted ulceration(s), there is no eschar. There is a moderate amount of slough, fibrin, and necrotic tissue noted. Granulation tissue and wound base is red. There is a minimal amount of serosanguineous drainage noted. There is no exposed bone muscle-tendon ligament or joint. There is no malodor. Periwound integrity is intact. Skin is warm, dry and supple bilateral lower extremities.  Vascular: Palpable pedal pulses bilaterally. No edema or erythema noted. Capillary refill within normal limits.  Neurological: Epicritic and protective threshold diminished bilaterally.   Musculoskeletal Exam: Range of motion within normal limits to all pedal and ankle joints bilateral. Muscle strength 5/5 in all groups bilateral.   Assessment: 1. Ulcerations noted to digits 2, 3, and  4 secondary to diabetes mellitus 2. diabetes mellitus w/ peripheral neuropathy   Plan of Care:  1. Patient was evaluated. 2. medically necessary excisional debridement including subcutaneous tissue was performed using a tissue nipper and a chisel blade. Excisional debridement of all the necrotic nonviable tissue down to healthy bleeding viable tissue was performed with post-debridement measurements same as pre-. 3. the wound was cleansed and dry sterile dressing applied. 4. Iodine ointment provided to patient.  5. Continue using post op shoe.  6. patient is to return to clinic in 4 weeks.    Edrick Kins, DPM [facility]  Dr. Edrick Kins, [address]                                        [place], [place] [address]                Office [phone]  Fax [phone]

## 2018-12-20 ENCOUNTER — Encounter: Payer: Self-pay | Admitting: *Deleted

## 2018-12-24 ENCOUNTER — Other Ambulatory Visit: Payer: Self-pay | Admitting: Nurse Practitioner

## 2018-12-24 DIAGNOSIS — I5032 Chronic diastolic (congestive) heart failure: Secondary | ICD-10-CM

## 2018-12-24 DIAGNOSIS — K922 Gastrointestinal hemorrhage, unspecified: Secondary | ICD-10-CM

## 2018-12-24 NOTE — Telephone Encounter (Signed)
carvedilol was denied, not on current medication list

## 2018-12-25 ENCOUNTER — Encounter: Payer: Self-pay | Admitting: Family

## 2018-12-25 ENCOUNTER — Observation Stay (HOSPITAL_COMMUNITY)
Admission: EM | Admit: 2018-12-25 | Discharge: 2018-12-26 | Disposition: A | Discharge status: Home / Self Care | Payer: Medicare Other | Attending: Internal Medicine | Admitting: Internal Medicine

## 2018-12-25 ENCOUNTER — Encounter (HOSPITAL_COMMUNITY): Payer: Self-pay | Admitting: Emergency Medicine

## 2018-12-25 ENCOUNTER — Ambulatory Visit (INDEPENDENT_AMBULATORY_CARE_PROVIDER_SITE_OTHER): Payer: Medicare Other | Admitting: Family

## 2018-12-25 ENCOUNTER — Other Ambulatory Visit: Payer: Self-pay

## 2018-12-25 ENCOUNTER — Emergency Department (HOSPITAL_COMMUNITY): Payer: Medicare Other

## 2018-12-25 VITALS — BP 142/64 | HR 74 | Temp 98.4°F | Ht 66.0 in | Wt 143.4 lb

## 2018-12-25 DIAGNOSIS — Z79899 Other long term (current) drug therapy: Secondary | ICD-10-CM | POA: Insufficient documentation

## 2018-12-25 DIAGNOSIS — I2510 Atherosclerotic heart disease of native coronary artery without angina pectoris: Secondary | ICD-10-CM | POA: Diagnosis not present

## 2018-12-25 DIAGNOSIS — R112 Nausea with vomiting, unspecified: Secondary | ICD-10-CM | POA: Diagnosis not present

## 2018-12-25 DIAGNOSIS — E119 Type 2 diabetes mellitus without complications: Secondary | ICD-10-CM

## 2018-12-25 DIAGNOSIS — Z7951 Long term (current) use of inhaled steroids: Secondary | ICD-10-CM | POA: Insufficient documentation

## 2018-12-25 DIAGNOSIS — E1122 Type 2 diabetes mellitus with diabetic chronic kidney disease: Secondary | ICD-10-CM

## 2018-12-25 DIAGNOSIS — I4819 Other persistent atrial fibrillation: Secondary | ICD-10-CM | POA: Diagnosis present

## 2018-12-25 DIAGNOSIS — Z03818 Encounter for observation for suspected exposure to other biological agents ruled out: Secondary | ICD-10-CM | POA: Diagnosis not present

## 2018-12-25 DIAGNOSIS — Z20828 Contact with and (suspected) exposure to other viral communicable diseases: Secondary | ICD-10-CM | POA: Insufficient documentation

## 2018-12-25 DIAGNOSIS — Z7901 Long term (current) use of anticoagulants: Secondary | ICD-10-CM

## 2018-12-25 DIAGNOSIS — N184 Chronic kidney disease, stage 4 (severe): Secondary | ICD-10-CM | POA: Insufficient documentation

## 2018-12-25 DIAGNOSIS — K5900 Constipation, unspecified: Secondary | ICD-10-CM

## 2018-12-25 DIAGNOSIS — I480 Paroxysmal atrial fibrillation: Secondary | ICD-10-CM | POA: Insufficient documentation

## 2018-12-25 DIAGNOSIS — K5790 Diverticulosis of intestine, part unspecified, without perforation or abscess without bleeding: Secondary | ICD-10-CM | POA: Diagnosis not present

## 2018-12-25 DIAGNOSIS — E038 Other specified hypothyroidism: Secondary | ICD-10-CM | POA: Insufficient documentation

## 2018-12-25 DIAGNOSIS — F329 Major depressive disorder, single episode, unspecified: Secondary | ICD-10-CM | POA: Insufficient documentation

## 2018-12-25 DIAGNOSIS — Z794 Long term (current) use of insulin: Secondary | ICD-10-CM | POA: Diagnosis not present

## 2018-12-25 DIAGNOSIS — E785 Hyperlipidemia, unspecified: Secondary | ICD-10-CM | POA: Insufficient documentation

## 2018-12-25 DIAGNOSIS — M3214 Glomerular disease in systemic lupus erythematosus: Secondary | ICD-10-CM | POA: Diagnosis not present

## 2018-12-25 DIAGNOSIS — J449 Chronic obstructive pulmonary disease, unspecified: Secondary | ICD-10-CM | POA: Insufficient documentation

## 2018-12-25 DIAGNOSIS — Z86718 Personal history of other venous thrombosis and embolism: Secondary | ICD-10-CM | POA: Diagnosis not present

## 2018-12-25 DIAGNOSIS — Z79890 Hormone replacement therapy (postmenopausal): Secondary | ICD-10-CM | POA: Insufficient documentation

## 2018-12-25 DIAGNOSIS — R531 Weakness: Secondary | ICD-10-CM | POA: Diagnosis not present

## 2018-12-25 DIAGNOSIS — I779 Disorder of arteries and arterioles, unspecified: Secondary | ICD-10-CM

## 2018-12-25 DIAGNOSIS — I10 Essential (primary) hypertension: Secondary | ICD-10-CM | POA: Diagnosis present

## 2018-12-25 DIAGNOSIS — Z8673 Personal history of transient ischemic attack (TIA), and cerebral infarction without residual deficits: Secondary | ICD-10-CM | POA: Diagnosis not present

## 2018-12-25 DIAGNOSIS — K219 Gastro-esophageal reflux disease without esophagitis: Secondary | ICD-10-CM

## 2018-12-25 DIAGNOSIS — K920 Hematemesis: Secondary | ICD-10-CM | POA: Diagnosis not present

## 2018-12-25 DIAGNOSIS — E1142 Type 2 diabetes mellitus with diabetic polyneuropathy: Secondary | ICD-10-CM | POA: Insufficient documentation

## 2018-12-25 DIAGNOSIS — I130 Hypertensive heart and chronic kidney disease with heart failure and stage 1 through stage 4 chronic kidney disease, or unspecified chronic kidney disease: Secondary | ICD-10-CM | POA: Diagnosis not present

## 2018-12-25 DIAGNOSIS — I739 Peripheral vascular disease, unspecified: Secondary | ICD-10-CM | POA: Diagnosis present

## 2018-12-25 DIAGNOSIS — I5032 Chronic diastolic (congestive) heart failure: Secondary | ICD-10-CM | POA: Insufficient documentation

## 2018-12-25 DIAGNOSIS — E039 Hypothyroidism, unspecified: Secondary | ICD-10-CM | POA: Diagnosis present

## 2018-12-25 DIAGNOSIS — I4892 Unspecified atrial flutter: Secondary | ICD-10-CM | POA: Diagnosis not present

## 2018-12-25 DIAGNOSIS — K625 Hemorrhage of anus and rectum: Secondary | ICD-10-CM | POA: Insufficient documentation

## 2018-12-25 DIAGNOSIS — K5730 Diverticulosis of large intestine without perforation or abscess without bleeding: Secondary | ICD-10-CM | POA: Diagnosis not present

## 2018-12-25 DIAGNOSIS — Z931 Gastrostomy status: Secondary | ICD-10-CM

## 2018-12-25 DIAGNOSIS — E222 Syndrome of inappropriate secretion of antidiuretic hormone: Secondary | ICD-10-CM | POA: Insufficient documentation

## 2018-12-25 DIAGNOSIS — R1013 Epigastric pain: Secondary | ICD-10-CM | POA: Diagnosis not present

## 2018-12-25 DIAGNOSIS — R195 Other fecal abnormalities: Secondary | ICD-10-CM

## 2018-12-25 DIAGNOSIS — K922 Gastrointestinal hemorrhage, unspecified: Secondary | ICD-10-CM | POA: Diagnosis present

## 2018-12-25 DIAGNOSIS — I4891 Unspecified atrial fibrillation: Secondary | ICD-10-CM

## 2018-12-25 LAB — COMPREHENSIVE METABOLIC PANEL
ALT: 20 U/L (ref 0–44)
AST: 27 U/L (ref 15–41)
Albumin: 3.8 g/dL (ref 3.5–5.0)
Alkaline Phosphatase: 87 U/L (ref 38–126)
Anion gap: 9 (ref 5–15)
BUN: 61 mg/dL — ABNORMAL HIGH (ref 8–23)
CO2: 27 mmol/L (ref 22–32)
Calcium: 9.7 mg/dL (ref 8.9–10.3)
Chloride: 101 mmol/L (ref 98–111)
Creatinine, Ser: 1.48 mg/dL — ABNORMAL HIGH (ref 0.44–1.00)
GFR calc Af Amer: 35 mL/min — ABNORMAL LOW (ref >60)
GFR calc non Af Amer: 30 mL/min — ABNORMAL LOW (ref >60)
Glucose, Bld: 87 mg/dL (ref 70–99)
Potassium: 4.1 mmol/L (ref 3.5–5.1)
Sodium: 137 mmol/L (ref 135–145)
Total Bilirubin: 0.5 mg/dL (ref 0.3–1.2)
Total Protein: 7.9 g/dL (ref 6.5–8.1)

## 2018-12-25 LAB — CBG MONITORING, ED: Glucose-Capillary: 77 mg/dL (ref 70–99)

## 2018-12-25 LAB — CBC
HCT: 30.9 % — ABNORMAL LOW (ref 36.0–46.0)
Hemoglobin: 10.1 g/dL — ABNORMAL LOW (ref 12.0–15.0)
MCH: 30.9 pg (ref 26.0–34.0)
MCHC: 32.7 g/dL (ref 30.0–36.0)
MCV: 94.5 fL (ref 80.0–100.0)
Platelets: 264 10*3/uL (ref 150–400)
RBC: 3.27 MIL/uL — ABNORMAL LOW (ref 3.87–5.11)
RDW: 15.3 % (ref 11.5–15.5)
WBC: 7.8 10*3/uL (ref 4.0–10.5)
nRBC: 0 % (ref 0.0–0.2)

## 2018-12-25 LAB — POC OCCULT BLOOD, ED: Fecal Occult Bld: POSITIVE — AB

## 2018-12-25 LAB — PROTIME-INR
INR: 2.7 — ABNORMAL HIGH (ref 0.8–1.2)
Prothrombin Time: 27.9 seconds — ABNORMAL HIGH (ref 11.4–15.2)

## 2018-12-25 LAB — SARS CORONAVIRUS 2 BY RT PCR (HOSPITAL ORDER, PERFORMED IN ~~LOC~~ HOSPITAL LAB): SARS Coronavirus 2: NEGATIVE

## 2018-12-25 MED ORDER — INSULIN ASPART 100 UNIT/ML ~~LOC~~ SOLN
0.0000 [IU] | Freq: Three times a day (TID) | SUBCUTANEOUS | Status: DC
  Filled 2018-12-25: qty 0.15

## 2018-12-25 MED ORDER — SODIUM CHLORIDE 0.9 % IV BOLUS
500.0000 mL | Freq: Once | INTRAVENOUS | Status: AC
  Administered 2018-12-25: 500 mL via INTRAVENOUS

## 2018-12-25 MED ORDER — ATORVASTATIN CALCIUM 10 MG PO TABS
10.0000 mg | ORAL_TABLET | Freq: Every day | ORAL | Status: DC
  Administered 2018-12-26: 10 mg via ORAL
  Filled 2018-12-25: qty 1

## 2018-12-25 MED ORDER — WARFARIN - PHARMACIST DOSING INPATIENT: Freq: Every day | Status: DC

## 2018-12-25 MED ORDER — WARFARIN SODIUM 5 MG PO TABS
5.0000 mg | ORAL_TABLET | ORAL | Status: DC
  Filled 2018-12-25: qty 1

## 2018-12-25 MED ORDER — ACETAMINOPHEN 650 MG RE SUPP: 650.0000 mg | Freq: Four times a day (QID) | RECTAL | Status: DC | PRN

## 2018-12-25 MED ORDER — SODIUM CHLORIDE (PF) 0.9 % IJ SOLN
INTRAMUSCULAR | Status: AC
  Administered 2018-12-25: 16:00:00
  Filled 2018-12-25: qty 50

## 2018-12-25 MED ORDER — ADULT MULTIVITAMIN W/MINERALS CH
1.0000 | ORAL_TABLET | Freq: Every day | ORAL | Status: DC
  Administered 2018-12-26: 1 via ORAL
  Filled 2018-12-25: qty 1

## 2018-12-25 MED ORDER — ACETAMINOPHEN 325 MG PO TABS
650.0000 mg | ORAL_TABLET | Freq: Four times a day (QID) | ORAL | Status: DC | PRN
  Administered 2018-12-26: 650 mg via ORAL
  Filled 2018-12-25: qty 2

## 2018-12-25 MED ORDER — HYDRALAZINE HCL 50 MG PO TABS
50.0000 mg | ORAL_TABLET | Freq: Three times a day (TID) | ORAL | Status: DC
  Administered 2018-12-26 (×2): 50 mg via ORAL
  Filled 2018-12-25 (×2): qty 1

## 2018-12-25 MED ORDER — AMLODIPINE BESYLATE 5 MG PO TABS
5.0000 mg | ORAL_TABLET | Freq: Every day | ORAL | Status: DC
  Administered 2018-12-26: 5 mg via ORAL
  Filled 2018-12-25: qty 1

## 2018-12-25 MED ORDER — LEVOTHYROXINE SODIUM 125 MCG PO TABS: 125.0000 ug | ORAL_TABLET | Freq: Every day | ORAL | Status: DC

## 2018-12-25 MED ORDER — IOHEXOL 300 MG/ML  SOLN
80.0000 mL | Freq: Once | INTRAMUSCULAR | Status: AC | PRN
  Administered 2018-12-25: 80 mL via INTRAVENOUS

## 2018-12-25 MED ORDER — INSULIN ASPART 100 UNIT/ML ~~LOC~~ SOLN
0.0000 [IU] | Freq: Every day | SUBCUTANEOUS | Status: DC
  Filled 2018-12-25: qty 0.05

## 2018-12-25 MED ORDER — ISOSORBIDE MONONITRATE ER 60 MG PO TB24
60.0000 mg | ORAL_TABLET | Freq: Every day | ORAL | Status: DC
  Administered 2018-12-26: 60 mg via ORAL
  Filled 2018-12-25: qty 1

## 2018-12-25 MED ORDER — INSULIN GLARGINE 100 UNIT/ML ~~LOC~~ SOLN
8.0000 [IU] | Freq: Every day | SUBCUTANEOUS | Status: DC
  Filled 2018-12-25 (×2): qty 0.08

## 2018-12-25 MED ORDER — PANTOPRAZOLE SODIUM 40 MG PO TBEC
40.0000 mg | DELAYED_RELEASE_TABLET | Freq: Two times a day (BID) | ORAL | Status: DC
  Administered 2018-12-26: 40 mg via ORAL
  Filled 2018-12-25: qty 1

## 2018-12-25 MED ORDER — CALCIUM CARBONATE-VITAMIN D 500-200 MG-UNIT PO TABS
1.0000 | ORAL_TABLET | Freq: Every day | ORAL | Status: DC
  Administered 2018-12-26: 1 via ORAL
  Filled 2018-12-25: qty 1

## 2018-12-25 MED ORDER — FUROSEMIDE 40 MG PO TABS
40.0000 mg | ORAL_TABLET | Freq: Every day | ORAL | Status: DC
  Administered 2018-12-26: 40 mg via ORAL
  Filled 2018-12-25: qty 1

## 2018-12-25 MED ORDER — LATANOPROST 0.005 % OP SOLN
1.0000 [drp] | Freq: Every day | OPHTHALMIC | Status: DC
  Filled 2018-12-25: qty 2.5

## 2018-12-25 MED ORDER — SERTRALINE HCL 100 MG PO TABS
100.0000 mg | ORAL_TABLET | Freq: Every day | ORAL | Status: DC
  Administered 2018-12-26: 10:00:00 100 mg via ORAL
  Filled 2018-12-25: qty 1

## 2018-12-25 MED ORDER — OXYCODONE HCL 5 MG PO TABS: 5.0000 mg | ORAL_TABLET | ORAL | Status: DC | PRN

## 2018-12-25 MED ORDER — FERROUS SULFATE 325 (65 FE) MG PO TABS
325.0000 mg | ORAL_TABLET | Freq: Every day | ORAL | Status: DC
  Administered 2018-12-26: 10:00:00 325 mg via ORAL
  Filled 2018-12-25: qty 1

## 2018-12-25 MED ORDER — IPRATROPIUM-ALBUTEROL 0.5-2.5 (3) MG/3ML IN SOLN: 3.0000 mL | Freq: Four times a day (QID) | RESPIRATORY_TRACT | Status: DC | PRN

## 2018-12-25 MED ORDER — GABAPENTIN 300 MG PO CAPS
300.0000 mg | ORAL_CAPSULE | Freq: Two times a day (BID) | ORAL | Status: DC
  Administered 2018-12-26: 300 mg via ORAL
  Filled 2018-12-25: qty 1

## 2018-12-25 MED ORDER — CYCLOSPORINE 0.05 % OP EMUL
1.0000 [drp] | Freq: Two times a day (BID) | OPHTHALMIC | Status: DC
  Administered 2018-12-26: 14:00:00 via OPHTHALMIC
  Filled 2018-12-25 (×3): qty 30

## 2018-12-25 MED ORDER — FLUTICASONE PROPIONATE 50 MCG/ACT NA SUSP
2.0000 | Freq: Every day | NASAL | Status: DC
  Administered 2018-12-26: 2 via NASAL
  Filled 2018-12-25: qty 16

## 2018-12-25 MED ORDER — WARFARIN SODIUM 5 MG PO TABS: 7.5000 mg | ORAL_TABLET | ORAL | Status: DC

## 2018-12-25 MED ORDER — FAMOTIDINE IN NACL 20-0.9 MG/50ML-% IV SOLN
20.0000 mg | Freq: Once | INTRAVENOUS | Status: AC
  Administered 2018-12-25: 18:00:00 20 mg via INTRAVENOUS
  Filled 2018-12-25: qty 50

## 2018-12-25 NOTE — H&P (Signed)
History and Physical    Shannon [name] HYQ:[id number] DOB: 01/14/1924 DOA: 12/25/2018  PCP: Sharon Seller, NP  Patient coming from: Home  Chief Complaint: Rectal bleeding  HPI: Shannon [name] is a 85 y.o. female with medical history significant of DM, afib on chronic coumadin, DVT, CAD, PVD presents to ED with several day hx of constipation and bright red blood per rectum. On further questioning, reports bright red blood when wiping primarily. Denies syncope or near syncope. Reports some epigastric discomfort improved sitting up, in hx of known GERD   ED Course: In ED, pt noted to have heme pos stools. Hgb noted to be 10.1, and was 9.6 on 8/20. CT abd performed with findings of diverticulosis and periumbilical hernias to the R and L of midline containing large and small bowels. Given concerns of blood per rectum, hospitalist consulted for consideration for admission.  Review of Systems:  Review of Systems  Constitutional: Negative for chills, malaise/fatigue and weight loss.  HENT: Negative for congestion, ear pain, nosebleeds and tinnitus.   Eyes: Negative for double vision, photophobia and pain.  Respiratory: Negative for hemoptysis, sputum production and shortness of breath.   Cardiovascular: Negative for palpitations, orthopnea and claudication.  Gastrointestinal: Positive for abdominal pain, blood in stool and constipation. Negative for vomiting.  Genitourinary: Negative for frequency, hematuria and urgency.  Musculoskeletal: Negative for back pain, joint pain and neck pain.  Neurological: Negative for tingling, tremors, loss of consciousness and headaches.  Psychiatric/Behavioral: Negative for hallucinations and memory loss. The patient is not nervous/anxious.     Past Medical History:  Diagnosis Date   Acute respiratory failure (HCC)    Anemia    previous blood transfusions   Arthritis    "all over"   Asthma    Atrial flutter (HCC)    Bradycardia    requiring  previous d/c of BB and reduction of amiodarone   CAD (coronary artery disease)    nonobstructive per notes   Chronic diastolic CHF (congestive heart failure) (HCC)    CKD (chronic kidney disease), stage III (HCC)    Complication of blood transfusion    "got the wrong blood type at [facility] in ~ 2015; no adverse reaction that we are aware of"/daughter, [name] (01/27/2016)   COPD (chronic obstructive pulmonary disease) (HCC)    Depression    "light case"   DVT (deep venous thrombosis) (HCC) 01/2016   a. LLE DVT 01/2016 - switched from Eliquis to Coumadin.   Dyspnea    with some activity   Gastric stenosis    a. s/p stomach tube   GERD (gastroesophageal reflux disease)    History of blood transfusion    "several" (01/27/2016)   History of stomach ulcers    Hyperlipidemia    Hypertension    Hypothyroidism    On home oxygen therapy    "2L; 9PM til 9AM" (06/27/2017)   PAD (peripheral artery disease) (HCC)    a. prior gangrene, toe amputations, intervention.   PAF (paroxysmal atrial fibrillation) (HCC)    Paraesophageal hernia    Perforated gastric ulcer (HCC)    Pneumonia    "a few times" (06/27/2017)   Seasonal allergies    SIADH (syndrome of inappropriate ADH production) (HCC)    [name] 01/10/2015   Small bowel obstruction (HCC)    "I don't know how many" (01/11/2015)   Stroke (HCC)    several- no residual   Type II diabetes mellitus (HCC)    "related to prednisone use  for > 20 yr; once predinose stopped; no more DM RX" (01/27/2016)   UTI (urinary tract infection) 02/08/2016   Ventral hernia with bowel obstruction     Past Surgical History:  Procedure Laterality Date   ABDOMINAL AORTOGRAM N/A 09/21/2016   Procedure: Abdominal Aortogram;  Surgeon: Maeola Harman, MD;  Location: [facility] INVASIVE CV LAB;  Service: Cardiovascular;  Laterality: N/A;   AMPUTATION Left 09/25/2016   Procedure: AMPUTATION DIGIT- LEFT 2ND AND 3RD TOES;  Surgeon:  Larina Earthly, MD;  Location: [facility] OR;  Service: Vascular;  Laterality: Left;   AMPUTATION Right 05/03/2018   Procedure: AMPUTATION RIGHT GREAT TOE;  Surgeon: Larina Earthly, MD;  Location: [facility] OR;  Service: Vascular;  Laterality: Right;   CATARACT EXTRACTION W/ INTRAOCULAR LENS  IMPLANT, BILATERAL     CHOLECYSTECTOMY OPEN     COLECTOMY     ESOPHAGOGASTRODUODENOSCOPY N/A 01/19/2014   Procedure: ESOPHAGOGASTRODUODENOSCOPY (EGD);  Surgeon: Hilarie Fredrickson, MD;  Location: [facility] ENDOSCOPY;  Service: Endoscopy;  Laterality: N/A;   ESOPHAGOGASTRODUODENOSCOPY N/A 01/20/2014   Procedure: ESOPHAGOGASTRODUODENOSCOPY (EGD);  Surgeon: Hilarie Fredrickson, MD;  Location: [facility] ENDOSCOPY;  Service: Endoscopy;  Laterality: N/A;   ESOPHAGOGASTRODUODENOSCOPY N/A 03/19/2014   Procedure: ESOPHAGOGASTRODUODENOSCOPY (EGD);  Surgeon: Rachael Fee, MD;  Location: [facility]COPY;  Service: Endoscopy;  Laterality: N/A;   ESOPHAGOGASTRODUODENOSCOPY N/A 07/08/2015   Procedure: ESOPHAGOGASTRODUODENOSCOPY (EGD);  Surgeon: Sherrilyn Rist, MD;  Location: [facility] ENDOSCOPY;  Service: Endoscopy;  Laterality: N/A;   ESOPHAGOGASTRODUODENOSCOPY (EGD) WITH PROPOFOL N/A 09/15/2015   Procedure: ESOPHAGOGASTRODUODENOSCOPY (EGD) WITH PROPOFOL;  Surgeon: Ruffin Frederick, MD;  Location: [facility] ENDOSCOPY;  Service: Gastroenterology;  Laterality: N/A;   GASTROJEJUNOSTOMY     hx/notes 01/10/2015   GASTROJEJUNOSTOMY N/A 09/23/2015   Procedure: OPEN GASTROJEJUNOSTOMY TUBE PLACEMENT;  Surgeon: De Blanch Kinsinger, MD;  Location: [facility] ORS;  Service: General;  Laterality: N/A;   GLAUCOMA SURGERY Bilateral    HERNIA REPAIR  2015   IR CM INJ ANY COLONIC TUBE W/FLUORO  01/05/2017   IR CM INJ ANY COLONIC TUBE W/FLUORO  06/07/2017   IR CM INJ ANY COLONIC TUBE W/FLUORO  11/05/2017   IR CM INJ ANY COLONIC TUBE W/FLUORO  09/18/2018   IR GENERIC HISTORICAL  01/07/2016   IR GJ TUBE CHANGE 01/07/2016 Malachy Moan, MD [facility]   IR GENERIC HISTORICAL   01/27/2016   IR MECH REMOV OBSTRUC MAT ANY COLON TUBE W/FLUORO 01/27/2016 Richarda Overlie, MD [facility]   IR GENERIC HISTORICAL  02/07/2016   IR PATIENT EVAL TECH 0-60 MINS Darrell K Allred, PA-C [facility]   IR GENERIC HISTORICAL  02/08/2016   IR GJ TUBE CHANGE 02/08/2016 Berdine Dance, MD [facility]   IR GENERIC HISTORICAL  01/06/2016   IR GJ TUBE CHANGE 01/06/2016 [facility] OUT REF   IR GENERIC HISTORICAL  05/02/2016   IR CM INJ ANY COLONIC TUBE W/FLUORO 05/02/2016 Oley Balm, MD [facility]   IR GENERIC HISTORICAL  05/15/2016   IR GJ TUBE CHANGE 05/15/2016 Simonne Come, MD [facility]   IR GENERIC HISTORICAL  06/28/2016   IR GJ TUBE CHANGE 06/28/2016 [facility]   IR GJ TUBE CHANGE  02/20/2017   IR GJ TUBE CHANGE  05/10/2017   IR GJ TUBE CHANGE  07/06/2017   IR GJ TUBE CHANGE  08/02/2017   IR GJ TUBE CHANGE  10/15/2017   IR GJ TUBE CHANGE  12/26/2017   IR GJ TUBE CHANGE  04/08/2018   IR GJ TUBE CHANGE  06/19/2018  IR PATIENT EVAL TECH 0-60 MINS  10/19/2016   IR PATIENT EVAL TECH 0-60 MINS  12/25/2016   IR PATIENT EVAL TECH 0-60 MINS  01/29/2017   IR PATIENT EVAL TECH 0-60 MINS  04/04/2017   IR PATIENT EVAL TECH 0-60 MINS  04/30/2017   IR PATIENT EVAL TECH 0-60 MINS  07/31/2017   IR REPLC DUODEN/JEJUNO TUBE PERCUT W/FLUORO  11/14/2016   LAPAROTOMY N/A 01/20/2015   Procedure: EXPLORATORY LAPAROTOMY;  Surgeon: Abigail Miyamoto, MD;  Location: [facility];  Service: General;  Laterality: N/A;   LOWER EXTREMITY ANGIOGRAPHY Left 09/21/2016   Procedure: Lower Extremity Angiography;  Surgeon: Maeola Harman, MD;  Location: [facility] INVASIVE CV LAB;  Service: Cardiovascular;  Laterality: Left;   LOWER EXTREMITY ANGIOGRAPHY Right 06/27/2017   Procedure: Lower Extremity Angiography;  Surgeon: Maeola Harman, MD;  Location: [facility] INVASIVE CV LAB;  Service: Cardiovascular;  Laterality: Right;   LYSIS OF ADHESION N/A 01/20/2015   Procedure: LYSIS OF ADHESIONS < 1 HOUR;  Surgeon:  Abigail Miyamoto, MD;  Location: [facility] OR;  Service: General;  Laterality: N/A;   PERIPHERAL VASCULAR BALLOON ANGIOPLASTY Left 09/21/2016   Procedure: Peripheral Vascular Balloon Angioplasty;  Surgeon: Maeola Harman, MD;  Location: [facility] INVASIVE CV LAB;  Service: Cardiovascular;  Laterality: Left;  drug coated balloon   PERIPHERAL VASCULAR BALLOON ANGIOPLASTY Right 06/27/2017   Procedure: PERIPHERAL VASCULAR BALLOON ANGIOPLASTY;  Surgeon: Maeola Harman, MD;  Location: [facility] INVASIVE CV LAB;  Service: Cardiovascular;  Laterality: Right;  SFA/TPTRUNK   TONSILLECTOMY     TUBAL LIGATION     VENTRAL HERNIA REPAIR  2015   incarcerated ventral hernia ([facility] 09/2013)/notes 01/10/2015     reports that she has never smoked. She quit smokeless tobacco use about 38 years ago.  Her smokeless tobacco use included snuff. She reports that she does not drink alcohol or use drugs.  Allergies  Allergen Reactions   Penicillins Itching, Rash and Other (See Comments)    Tolerated amoxicillin, unasyn, zosyn & cephalosporins in the past. Did it involve swelling of the face/tongue/throat, SOB, or low BP? No Did it involve sudden or severe rash/hives, skin peeling, or any reaction on the inside of your mouth or nose? No Did you need to seek medical attention at a hospital or doctor's office? Unknown When did it last happen?5+ years If all above answers are "NO", may proceed with cephalosporin use.     Family History  Problem Relation Age of Onset   Stroke Mother    Hypertension Mother    Diabetes Brother    Glaucoma Son    Glaucoma Son    Heart attack Neg Hx    Colon cancer Neg Hx    Stomach cancer Neg Hx     Prior to Admission medications   Medication Sig Start Date End Date Taking? Authorizing Provider  acetaminophen (TYLENOL) 500 MG tablet Take 500 mg by mouth every 6 (six) hours as needed (pain).    Yes [provider]  Amino Acids-Protein Hydrolys (FEEDING  SUPPLEMENT, PRO-STAT SUGAR FREE 64,) LIQD Take 30 mLs by mouth 2 (two) times daily.   Yes [provider]  amLODipine (NORVASC) 5 MG tablet Take 1 tablet (5 mg total) by mouth daily. 11/26/18 11/26/19 Yes Spongberg, Susy Frizzle, MD  atorvastatin (LIPITOR) 10 MG tablet Take 1 tablet (10 mg total) by mouth daily. 09/25/18  Yes Sharon Seller, NP  b complex vitamins tablet Take 1 tablet by mouth daily.    Yes [provider]  calcium-vitamin D (OSCAL WITH D) 500-200 MG-UNIT per tablet Take 1 tablet by mouth daily with breakfast.    Yes [provider]  Carboxymethylcellulose Sodium (ARTIFICIAL TEARS OP) Place 1 drop into both eyes 4 (four) times daily.   Yes [provider]  collagenase (SANTYL) ointment Apply 1 application topically daily as needed (wound care).    Yes [provider]  cycloSPORINE (RESTASIS) 0.05 % ophthalmic emulsion USE 1 DROP INTO BOTH EYES TWICE DAILY Patient taking differently: Place 1 drop into both eyes 2 (two) times daily.  01/29/17  Yes Montez Morita, Monica, DO  feeding supplement (OSMOLITE 1 CAL) LIQD Take 711 mLs by mouth See admin instructions. For 12 hours 2100 - 0900   Yes [provider]  ferrous sulfate 325 (65 FE) MG tablet TAKE 1 TABLET BY MOUTH EVERY DAY Patient taking differently: Take 325 mg by mouth daily.  11/01/18  Yes Sharon Seller, NP  fluticasone (FLONASE) 50 MCG/ACT nasal spray SPRAY 2 SPRAYS INTO EACH NOSTRIL EVERY DAY Patient taking differently: Place 2 sprays into both nostrils daily.  10/14/18  Yes Sharon Seller, NP  furosemide (LASIX) 40 MG tablet TAKE 1 TABLET BY MOUTH EVERY DAY Patient taking differently: Take 40 mg by mouth daily.  04/12/18  Yes Lars Masson, MD  gabapentin (NEURONTIN) 300 MG capsule TAKE 1 CAPSULE BY MOUTH TWICE A DAY Patient taking differently: Take 300 mg by mouth 2 (two) times daily.  07/26/18  Yes Sharon Seller, NP  gentamicin cream (GARAMYCIN) 0.1 % Apply 1  application topically 3 (three) times daily as needed (dry skin).    Yes [provider]  hydrALAZINE (APRESOLINE) 50 MG tablet TAKE 1 TABLET BY MOUTH THREE TIMES A DAY Patient taking differently: Take 50 mg by mouth 3 (three) times daily.  07/22/18  Yes Lars Masson, MD  insulin aspart (NOVOLOG) 100 UNIT/ML FlexPen Inject 0-6 Units into the skin 3 (three) times daily with meals. New sliding Scale If BS is 151 - 251 = 2 units,  If BS is 251 -  350 = 4 units,  If BS is 351 - 450 = 6 units Call provider if BS runs more than 400   Yes [provider]  insulin glargine (LANTUS) 100 UNIT/ML injection Inject 8 Units into the skin at bedtime.    Yes [provider]  ipratropium-albuterol (DUONEB) 0.5-2.5 (3) MG/3ML SOLN Take 3 mLs by nebulization every 6 (six) hours as needed (sob). Use 3 times daily x 4 days then every 6 hours as needed. Patient taking differently: Take 3 mLs by nebulization every 6 (six) hours as needed (sob).  03/21/16  Yes Janetta Hora, PA-C  isosorbide mononitrate (IMDUR) 30 MG 24 hr tablet Take 2 tablets (60 mg total) by mouth daily. Please make annual appt with Dr. Delton See for future refills. Thank you. 1st attempt. Patient taking differently: Take 60 mg by mouth daily.  10/03/18  Yes Lars Masson, MD  latanoprost (XALATAN) 0.005 % ophthalmic solution Place 1 drop into both eyes at bedtime. 06/02/14  Yes Oneal Grout, MD  levalbuterol [facility] HFA) 45 MCG/ACT inhaler Inhale 2 puffs into the lungs every 6 (six) hours as needed for wheezing or shortness of breath. 02/03/16  Yes Kirt Boys, DO  levothyroxine (SYNTHROID, LEVOTHROID) 125 MCG tablet Take 1 tablet (125 mcg total) by mouth daily. 04/18/18  Yes Sharon Seller, NP  loratadine (CLARITIN) 10 MG tablet Take 10 mg by mouth daily  as needed for allergies.   Yes [provider]  mometasone-formoterol (DULERA) 200-5 MCG/ACT AERO TAKE TWO PUFFS BY MOUTH TWICE DAILY Patient taking  differently: Inhale 2 puffs into the lungs 2 (two) times daily.  11/11/18  Yes Sharon Seller, NP  Multiple Vitamin (MULTIVITAMIN WITH MINERALS) TABS tablet Take 1 tablet by mouth daily.   Yes [provider]  nystatin (MYCOSTATIN) 100000 UNIT/ML suspension Take 5 mLs (500,000 Units total) by mouth 4 (four) times daily. 12/02/18  Yes Sharon Seller, NP  nystatin (MYCOSTATIN/NYSTOP) powder Apply 1 g topically as needed (skin irritation).    Yes [provider]  oxyCODONE (ROXICODONE) 5 MG immediate release tablet Take 1 tablet (5 mg total) by mouth every 4 (four) hours as needed for severe pain. May take 1/2 tab q4hrs as needed for mild pain. Patient taking differently: Take 2.5-5 mg by mouth every 4 (four) hours as needed for severe pain.  06/14/18  Yes Sharon Seller, NP  OXYGEN Inhale 2 L into the lungs at bedtime.   Yes [provider]  pantoprazole (PROTONIX) 40 MG tablet TAKE 1 TABLET BY MOUTH TWICE A DAY Patient taking differently: Take 40 mg by mouth 2 (two) times daily.  12/24/18  Yes Sharon Seller, NP  sertraline (ZOLOFT) 100 MG tablet TAKE 1 TABLET BY MOUTH EVERY DAY FOR DEPRESSION Patient taking differently: Take 100 mg by mouth daily.  04/17/18  Yes Sharon Seller, NP  warfarin (COUMADIN) 2.5 MG tablet TAKE AS DIRECTED BY COUMADIN CLINIC. Patient taking differently: Take 5-7.5 mg by mouth See admin instructions. Take 5mg  by mouth daily, except on Sunday, take 7.5mg  by mouth 09/02/18  Yes Lars Masson, MD  Water For Irrigation, Sterile (FREE WATER) SOLN Place 150 mLs into feeding tube 3 (three) times daily.    Yes [provider]  White Petrolatum-Mineral Oil (SYSTANE NIGHTTIME) OINT Place 1 application into both eyes at bedtime.    Yes [provider]  Insulin Pen Needle (B-D UF III MINI PEN NEEDLES) 31G X 5 MM MISC Use twice daily with giving insulin injections. Dx E11.9 07/17/18   Sharon Seller, NP    Physical  Exam: Vitals:   12/25/18 1546 12/25/18 1547 12/25/18 1600 12/25/18 1700  BP:   (!) 188/60 (!) 177/72  Pulse: 67 67 68 74  Resp: 16 11 14 18   Temp:      TempSrc:      SpO2: 98% 100% 100% 100%    Constitutional: NAD, calm, comfortable Vitals:   12/25/18 1546 12/25/18 1547 12/25/18 1600 12/25/18 1700  BP:   (!) 188/60 (!) 177/72  Pulse: 67 67 68 74  Resp: 16 11 14 18   Temp:      TempSrc:      SpO2: 98% 100% 100% 100%   Eyes: PERRL, lids and conjunctivae normal ENMT: Mucous membranes are moist. Posterior pharynx clear of any exudate or lesions.Normal dentition.  Neck: normal, supple, no masses, no thyromegaly Respiratory: clear to auscultation bilaterally, no wheezing, no crackles. Normal respiratory effort. No accessory muscle use.  Cardiovascular: Regular rate and rhythm Abdomen: no tenderness, no masses palpated. No hepatosplenomegaly. Bowel sounds positive.  Musculoskeletal: no clubbing / cyanosis. No joint deformity upper and lower extremities. Good ROM, no contractures. Normal muscle tone.  Skin: no rashes, lesions, ulcers. No induration Neurologic: CN 2-12 grossly intact. Sensation intact, trength 5/5 in all 4.  Psychiatric: Normal judgment and insight. Alert and oriented x 3. Normal mood.  Labs on Admission: I have personally reviewed following labs and imaging studies  CBC: Recent Labs  Lab 12/25/18 1518  WBC 7.8  HGB 10.1*  HCT 30.9*  MCV 94.5  PLT 264   Basic Metabolic Panel: Recent Labs  Lab 12/25/18 1518  NA 137  K 4.1  CL 101  CO2 27  GLUCOSE 87  BUN 61*  CREATININE 1.48*  CALCIUM 9.7   GFR: Estimated Creatinine Clearance: 22.2 mL/min (A) (by C-G formula based on SCr of 1.48 mg/dL (H)). Liver Function Tests: Recent Labs  Lab 12/25/18 1518  AST 27  ALT 20  ALKPHOS 87  BILITOT 0.5  PROT 7.9  ALBUMIN 3.8   No results for input(s): LIPASE, AMYLASE in the last 168 hours. No results for input(s): AMMONIA in the last 168 hours. Coagulation  Profile: Recent Labs  Lab 12/25/18 1518  INR 2.7*   Cardiac Enzymes: No results for input(s): CKTOTAL, CKMB, CKMBINDEX, TROPONINI in the last 168 hours. BNP (last 3 results) No results for input(s): PROBNP in the last 8760 hours. HbA1C: No results for input(s): HGBA1C in the last 72 hours. CBG: No results for input(s): GLUCAP in the last 168 hours. Lipid Profile: No results for input(s): CHOL, HDL, LDLCALC, TRIG, CHOLHDL, LDLDIRECT in the last 72 hours. Thyroid Function Tests: No results for input(s): TSH, T4TOTAL, FREET4, T3FREE, THYROIDAB in the last 72 hours. Anemia Panel: No results for input(s): VITAMINB12, FOLATE, FERRITIN, TIBC, IRON, RETICCTPCT in the last 72 hours. Urine analysis:    Component Value Date/Time   COLORURINE YELLOW 11/24/2018 1952   APPEARANCEUR HAZY (A) 11/24/2018 1952   LABSPEC 1.008 11/24/2018 1952   PHURINE 7.0 11/24/2018 1952   GLUCOSEU NEGATIVE 11/24/2018 1952   HGBUR NEGATIVE 11/24/2018 1952   BILIRUBINUR NEGATIVE 11/24/2018 1952   BILIRUBINUR Neg 11/01/2017 1228   KETONESUR NEGATIVE 11/24/2018 1952   PROTEINUR NEGATIVE 11/24/2018 1952   UROBILINOGEN 0.2 11/01/2017 1228   UROBILINOGEN 0.2 01/27/2015 2308   NITRITE NEGATIVE 11/24/2018 1952   LEUKOCYTESUR NEGATIVE 11/24/2018 1952   Sepsis Labs: !!!!!!!!!!!!!!!!!!!!!!!!!!!!!!!!!!!!!!!!!!!! @LABRCNTIP (procalcitonin:4,lacticidven:4) )No results found for this or any previous visit (from the past 240 hour(s)).   Radiological Exams on Admission: Ct Abdomen Pelvis W Contrast  Result Date: 12/25/2018 CLINICAL DATA:  Acute abdominal pain and rectal pain. Blood in stools for 1 week. EXAM: CT ABDOMEN AND PELVIS WITH CONTRAST TECHNIQUE: Multidetector CT imaging of the abdomen and pelvis was performed using the standard protocol following bolus administration of intravenous contrast. CONTRAST:  80mL OMNIPAQUE IOHEXOL 300 MG/ML  SOLN COMPARISON:  CT scan of the abdomen dated 06/06/2017 and abdominal  radiographs dated 12/25/2018 FINDINGS: Lower chest: No acute abnormality. Aortic atherosclerosis. Hepatobiliary: Multiple small hepatic cysts, unchanged. Cholecystectomy. No dilated bile ducts. Pancreas: Unremarkable. No pancreatic ductal dilatation or surrounding inflammatory changes. Spleen: Normal in size without focal abnormality. Adrenals/Urinary Tract: Adrenal glands and kidneys are normal except for a 12 mm cyst on the lower pole of the left kidney. No hydronephrosis. Bladder is normal. Stomach/Bowel: There are diverticula throughout the colon. Gastrostomy tube in place. The tip of the tube is in the proximal jejunum in the left upper quadrant. No dilated bowel. Appendix is not visualized. Surgical staple lines noted in several areas of the bowel. Vascular/Lymphatic: Aortic atherosclerosis. No enlarged abdominal or pelvic lymph nodes. Reproductive: Uterus and bilateral adnexa are unremarkable. Other: There are periumbilical hernias to the right and left of midline. The hernia to the right contains large and small bowel loops without evidence of obstruction.  The hernia to the left of midline contains only peritoneal fat. No significant change since the prior study. Musculoskeletal: No acute abnormalities. Degenerative disc and joint disease in the lower lumbar spine. Old mild compression fracture of T11. IMPRESSION: 1. No acute abnormalities of the abdomen or pelvis. 2. Diverticulosis of the colon. 3. Periumbilical hernias to the right and left of midline containing large and small bowel without evidence of obstruction. 4. Aortic atherosclerosis. Aortic Atherosclerosis (ICD10-I70.0). Electronically Signed   By: Francene Boyers M.D.   On: 12/25/2018 16:38   Dg Abdomen Acute W/chest  Result Date: 12/25/2018 CLINICAL DATA:  Constipation EXAM: DG ABDOMEN ACUTE W/ 1V CHEST COMPARISON:  June 06, 2017 FINDINGS: There is elevation of the right hemidiaphragm. There is no pneumothorax. No large pleural effusion. No  focal infiltrate. Aortic calcifications are noted. The bowel gas pattern is nonobstructive. A gastrojejunostomy is noted. The tube appears well position. Surgical staples are noted in the left lower quadrant. There are degenerative changes throughout the visualized portions of the thoracolumbar spine. IMPRESSION: 1. No evidence of acute cardiopulmonary process. 2. Nonobstructive bowel gas pattern. 3. Stable elevation of the right hemidiaphragm. 4. Gastrojejunostomy tube appears well position. Electronically Signed   By: Katherine Mantle M.D.   On: 12/25/2018 16:02    Assessment/Plan Principal Problem:   Atrial fibrillation and flutter (HCC) Active Problems:   Essential hypertension, benign   Diabetes mellitus type 2 in nonobese (HCC)   Persistent atrial fibrillation   Hypothyroidism   Constipation   Hyperlipidemia   PVD (peripheral vascular disease) (HCC)   Warfarin anticoagulation   PEG (percutaneous endoscopic gastrostomy) status (HCC)   Peripheral vascular disease (HCC)   1. Afib 1. Currently rate controlled 2. Not currently on meds for rate control 3. Discussed case with Cardiologist on-call, see below. Recommendation to continue coumadin if no significant change in hgb 4. CHADS-VASc of 9 with HAS-BLED of 4 2. Bright red blood per rectum 1. Presents with blood per rectum in setting of constipation, likely hemorrhoidal bleed 2. Hgb remains stable at 10.1 at time of presentation 3. Will repeat CBC in AM. If remains stable, would continue with anticoagulation as risk to anticoagulation may outweigh benefits to stopping 3. HTN 1. BP stable at present 2. Cont home meds 4. Chronic anticoagulation 1. Per above, will hold coumadin tonight 2. Repeat CBC in AM, if hgb remains stable, then consider resuming given above risk/benefit to anticoagulation 5. DM2 1. Will continue on SSI coverage as needed 2. Seems stable at this time 6. Hypothyroid 1. Would resume thyroid replacement as  tolerated 2. stable 7. HLD 1. Would resume home meds as toleratd 8. PVD 1. S/p vascular surgery 2. Seems stable 9. Hx PEG placement 1. Presently stable 2. Would consult Nutrition  DVT prophylaxis: SCD's  Code Status: Partial. CPR OK Family Communication: Pt in room  Disposition Plan: Uncertain at this time  Consults called: Discussed case with Cardiology over phone Admission status: Observation, as would likely require less than 2 midnight stay to monitor patient's hemoglobin   Rickey Barbara MD [facility] Pager On Amion  If 7PM-7AM, please contact night-coverage  12/25/2018, 5:58 PM

## 2018-12-25 NOTE — ED Triage Notes (Signed)
Pt was seen at PCP today and advised to Ed for blood in stools x week. Pt c/o burning in her rectum.

## 2018-12-25 NOTE — ED Provider Notes (Signed)
[facility] DEPT Provider Note   CSN: [id number] Arrival date & time: 12/25/18  1257     History   Chief Complaint Chief Complaint  Patient presents with   Rectal Bleeding    HPI WILLADEAN GUYTON is a 85 y.o. female.     Pt presents to the ED today with rectal bleeding.  The pt said she's had rectal bleeding for the past week, but not associated with stools.  The pt has also had some n/v.  She has pain in her rectum and has been constipated.  The pt also c/o epigastric pain.  The pt does get tube feedings from 9p-9a.due to gastric stenosis.  She drinks, but does not eat any food.  Pt went to pcp today for these sx.  Stool guaiac was positive at pcp's office.  Pt is on coumadin for DVTs.       Past Medical History:  Diagnosis Date   Acute respiratory failure ([place])    Anemia    previous blood transfusions   Arthritis    "all over"   Asthma    Atrial flutter (HCC)    Bradycardia    requiring previous d/c of BB and reduction of amiodarone   CAD (coronary artery disease)    nonobstructive per notes   Chronic diastolic CHF (congestive heart failure) ([place])    CKD (chronic kidney disease), stage III ([place])    Complication of blood transfusion    "got the wrong blood type at [facility] in ~ 2015; no adverse reaction that we are aware of"/daughter, [name] (01/27/2016)   COPD (chronic obstructive pulmonary disease) ([place])    Depression    "light case"   DVT (deep venous thrombosis) ([place]) 01/2016   a. LLE DVT 01/2016 - switched from Eliquis to Coumadin.   Dyspnea    with some activity   Gastric stenosis    a. s/p stomach tube   GERD (gastroesophageal reflux disease)    History of blood transfusion    "several" (01/27/2016)   History of stomach ulcers    Hyperlipidemia    Hypertension    Hypothyroidism    On home oxygen therapy    "2L; 9PM til 9AM" (06/27/2017)   PAD (peripheral artery disease) (HCC)    a. prior  gangrene, toe amputations, intervention.   PAF (paroxysmal atrial fibrillation) ([place])    Paraesophageal hernia    Perforated gastric ulcer ([place])    Pneumonia    "a few times" (06/27/2017)   Seasonal allergies    SIADH (syndrome of inappropriate ADH production) ([place])    [name] 01/10/2015   Small bowel obstruction ([place])    "I don't know how many" (01/11/2015)   Stroke ([place])    several- no residual   Type II diabetes mellitus ([place])    "related to prednisone use  for > 20 yr; once predinose stopped; no more DM RX" (01/27/2016)   UTI (urinary tract infection) 02/08/2016   Ventral hernia with bowel obstruction     Patient Active Problem List   Diagnosis Date Noted   Bradycardia 11/25/2018   Amputated great toe of right foot ([place]) 05/06/2018   Peripheral vascular disease ([place]) 05/03/2018   Localized, primary osteoarthritis of shoulder region 01/21/2018   PAD (peripheral artery disease) ([place]) 06/27/2017   UTI due to extended-spectrum beta lactamase (ESBL) producing Escherichia coli 05/04/2017   Acute renal failure superimposed on stage 3 chronic kidney disease ([place]) 50/93/2671   Complication of feeding tube ([place]) 02/20/2017  Abdominal wall cellulitis 02/20/2017   PEG tube malfunction (HCC)    COPD with asthma ([place]) 10/03/2016   Chronic deep vein thrombosis (DVT) of tibial vein of left lower extremity ([place]) 10/03/2016   Gangrene of toe of left foot ([place]) 09/15/2016   Recurrent aspiration pneumonia ([place])    Atherosclerosis of native arteries of the extremities with gangrene ([place]) 07/28/2016   Calcium channel blocker adverse reaction 06/08/2016   Other specified hypothyroidism    CHF (NYHA class IV, ACC/AHA stage D) (HCC)    Shortness of breath    Pressure injury of skin 03/11/2016   PEG (percutaneous endoscopic gastrostomy) status ([place]) 03/10/2016   Encounter for therapeutic drug monitoring 02/16/2016   Warfarin anticoagulation    Chronic seasonal  allergic rhinitis    Atrial fibrillation and flutter ([place]) 01/24/2016   Chronic diastolic CHF (congestive heart failure) ([place]) 01/24/2016   Bilateral leg edema 12/29/2015   Hypothyroidism due to medication    Weakness    Pyloric stenosis    Pressure ulcer 09/15/2015   Acute osteomyelitis of toe of right foot ([place])    PVD (peripheral vascular disease) ([place])    Adjustment disorder with anxiety 08/29/2015   Diabetic ulcer of toe of right foot associated with type 2 diabetes mellitus, with necrosis of muscle ([place])    Diverticulitis of large intestine without perforation or abscess without bleeding    Colitis 08/26/2015   Esophageal candidiasis (HCC)    SOB (shortness of breath)    Acute renal failure superimposed on stage 4 chronic kidney disease ([place])    Encounter for long-term (current) use of other high-risk medications    Dysphagia 07/05/2015   Asthma with allergic rhinitis 06/04/2015   Hypertensive heart disease 03/18/2015   Malnutrition of moderate degree 01/28/2015   Syncope 01/27/2015   Symptomatic bradycardia 01/21/2015   Hypotension due to drugs 01/21/2015   Protein-calorie malnutrition, severe 01/12/2015   Cerebrovascular disease 07/14/2014   Diabetic polyneuropathy associated with type 2 diabetes mellitus ([place]) 07/14/2014   Hyperlipidemia 07/14/2014   Gastric outlet obstruction 05/21/2014   Unstable gait 04/29/2014   Slurred speech    Gastroparesis    Slow transit constipation 02/03/2014   Carotid stenosis 02/03/2014   Demand ischemia ([place]) 01/21/2014   Gastric bezoar 01/20/2014   Anemia of chronic disease 01/18/2014   Chronic kidney insufficiency, stage 3 (moderate) ([place]) 01/18/2014   Depression 09/29/2013   Essential hypertension, benign 09/22/2013   GERD (gastroesophageal reflux disease) 09/22/2013   Dyslipidemia 09/22/2013   Diabetes mellitus type 2 in nonobese ([place]) 09/22/2013   Persistent atrial fibrillation  09/22/2013   History of completed stroke 09/22/2013   Diabetic neuropathy ([place]) 09/22/2013   Hypothyroidism 09/22/2013   Chronic gastrojejunal ulcer with perforation ([place]) 09/07/2013   Accelerated hypertension 09/07/2013   Systemic lupus erythematosus (SLE) inhibitor ([place]) 09/07/2013   Other specified abnormal immunological findings in serum 09/07/2013   Red blood cell antibody positive 09/07/2013    Past Surgical History:  Procedure Laterality Date   ABDOMINAL AORTOGRAM N/A 09/21/2016   Procedure: Abdominal Aortogram;  Surgeon: Waynetta Sandy, MD;  Location: [facility];  Service: Cardiovascular;  Laterality: N/A;   AMPUTATION Left 09/25/2016   Procedure: AMPUTATION DIGIT- LEFT 2ND AND 3RD TOES;  Surgeon: Rosetta Posner, MD;  Location: [facility];  Service: Vascular;  Laterality: Left;   AMPUTATION Right 05/03/2018   Procedure: AMPUTATION RIGHT GREAT TOE;  Surgeon: Rosetta Posner, MD;  Location: [facility];  Service: Vascular;  Laterality: Right;  CATARACT EXTRACTION W/ INTRAOCULAR LENS  IMPLANT, BILATERAL     CHOLECYSTECTOMY OPEN     COLECTOMY     ESOPHAGOGASTRODUODENOSCOPY N/A 01/19/2014   Procedure: ESOPHAGOGASTRODUODENOSCOPY (EGD);  Surgeon: Irene Shipper, MD;  Location: [facility];  Service: Endoscopy;  Laterality: N/A;   ESOPHAGOGASTRODUODENOSCOPY N/A 01/20/2014   Procedure: ESOPHAGOGASTRODUODENOSCOPY (EGD);  Surgeon: Irene Shipper, MD;  Location: [facility];  Service: Endoscopy;  Laterality: N/A;   ESOPHAGOGASTRODUODENOSCOPY N/A 03/19/2014   Procedure: ESOPHAGOGASTRODUODENOSCOPY (EGD);  Surgeon: Milus Banister, MD;  Location: [facility];  Service: Endoscopy;  Laterality: N/A;   ESOPHAGOGASTRODUODENOSCOPY N/A 07/08/2015   Procedure: ESOPHAGOGASTRODUODENOSCOPY (EGD);  Surgeon: Doran Stabler, MD;  Location[facility];  Service: Endoscopy;  Laterality: N/A;   ESOPHAGOGASTRODUODENOSCOPY (EGD) WITH PROPOFOL N/A 09/15/2015   Procedure:  ESOPHAGOGASTRODUODENOSCOPY (EGD) WITH PROPOFOL;  Surgeon: Manus Gunning, MD;  Location: [facility];  Service: Gastroenterology;  Laterality: N/A;   GASTROJEJUNOSTOMY     hx/notes 01/10/2015   GASTROJEJUNOSTOMY N/A 09/23/2015   Procedure: OPEN GASTROJEJUNOSTOMY TUBE PLACEMENT;  Surgeon: Arta Bruce Kinsinger, MD;  Location: [facility];  Service: General;  Laterality: N/A;   GLAUCOMA SURGERY Bilateral    HERNIA REPAIR  2015   IR CM INJ ANY COLONIC TUBE W/FLUORO  01/05/2017   IR CM INJ ANY COLONIC TUBE W/FLUORO  06/07/2017   IR CM INJ ANY COLONIC TUBE W/FLUORO  11/05/2017   IR CM INJ ANY COLONIC TUBE W/FLUORO  09/18/2018   IR GENERIC HISTORICAL  01/07/2016   IR GJ TUBE CHANGE 01/07/2016 Jacqulynn Cadet, MD [facility]   IR GENERIC HISTORICAL  01/27/2016   IR MECH REMOV OBSTRUC MAT ANY COLON TUBE W/FLUORO 01/27/2016 Markus Daft, MD [facility]   IR GENERIC HISTORICAL  02/07/2016   IR PATIENT EVAL TECH 0-60 MINS Darrell K Allred, PA-C [facility]   IR GENERIC HISTORICAL  02/08/2016   IR GJ TUBE CHANGE 02/08/2016 Greggory Keen, MD [facility]   IR GENERIC HISTORICAL  01/06/2016   IR GJ TUBE CHANGE 01/06/2016 [facility] OUT REF   IR GENERIC HISTORICAL  05/02/2016   IR CM INJ ANY COLONIC TUBE W/FLUORO 05/02/2016 Arne [place], MD [facility]   IR GENERIC HISTORICAL  05/15/2016   IR GJ TUBE CHANGE 05/15/2016 Sandi Mariscal, MD [facility]   IR GENERIC HISTORICAL  06/28/2016   IR GJ TUBE CHANGE 06/28/2016 [facility]   IR GJ TUBE CHANGE  02/20/2017   IR GJ TUBE CHANGE  05/10/2017   IR GJ TUBE CHANGE  07/06/2017   IR GJ TUBE CHANGE  08/02/2017   IR GJ TUBE CHANGE  10/15/2017   IR GJ TUBE CHANGE  12/26/2017   IR GJ TUBE CHANGE  04/08/2018   IR GJ TUBE CHANGE  06/19/2018   IR PATIENT EVAL TECH 0-60 MINS  10/19/2016   IR PATIENT EVAL TECH 0-60 MINS  12/25/2016   IR PATIENT EVAL TECH 0-60 MINS  01/29/2017   IR PATIENT EVAL TECH 0-60 MINS  04/04/2017   IR PATIENT EVAL TECH 0-60 MINS   04/30/2017   IR PATIENT EVAL TECH 0-60 MINS  07/31/2017   IR REPLC DUODEN/JEJUNO TUBE PERCUT W/FLUORO  11/14/2016   LAPAROTOMY N/A 01/20/2015   Procedure: EXPLORATORY LAPAROTOMY;  Surgeon: Coralie Keens, MD;  Location: [facility];  Service: General;  Laterality: N/A;   LOWER EXTREMITY ANGIOGRAPHY Left 09/21/2016   Procedure: Lower Extremity Angiography;  Surgeon: Waynetta Sandy, MD;  Location: [facility];  Service: Cardiovascular;  Laterality: Left;  LOWER EXTREMITY ANGIOGRAPHY Right 06/27/2017   Procedure: Lower Extremity Angiography;  Surgeon: Waynetta Sandy, MD;  Location: [facility];  Service: Cardiovascular;  Laterality: Right;   LYSIS OF ADHESION N/A 01/20/2015   Procedure: LYSIS OF ADHESIONS < 1 HOUR;  Surgeon: Coralie Keens, MD;  Location: [facility];  Service: General;  Laterality: N/A;   PERIPHERAL VASCULAR BALLOON ANGIOPLASTY Left 09/21/2016   Procedure: Peripheral Vascular Balloon Angioplasty;  Surgeon: Waynetta Sandy, MD;  Location: [facility];  Service: Cardiovascular;  Laterality: Left;  drug coated balloon   PERIPHERAL VASCULAR BALLOON ANGIOPLASTY Right 06/27/2017   Procedure: PERIPHERAL VASCULAR BALLOON ANGIOPLASTY;  Surgeon: Waynetta Sandy, MD;  Location: [facility];  Service: Cardiovascular;  Laterality: Right;  SFA/TPTRUNK   TONSILLECTOMY     TUBAL LIGATION     VENTRAL HERNIA REPAIR  2015   incarcerated ventral hernia ([facility] 09/2013)/notes 01/10/2015     OB History   No obstetric history on file.      Home Medications    Prior to Admission medications   Medication Sig Start Date End Date Taking? Authorizing Provider  acetaminophen (TYLENOL) 500 MG tablet Take 500 mg by mouth every 6 (six) hours as needed (pain).    Yes [provider]  Amino Acids-Protein Hydrolys (FEEDING SUPPLEMENT, PRO-STAT SUGAR FREE 64,) LIQD Take 30 mLs by mouth 2 (two) times daily.   Yes [provider]  amLODipine (NORVASC) 5 MG tablet Take 1 tablet (5 mg total) by mouth daily. 11/26/18 11/26/19 Yes Spongberg, Audie Pinto, MD  atorvastatin (LIPITOR) 10 MG tablet Take 1 tablet (10 mg total) by mouth daily. 09/25/18  Yes [name], NP  b complex vitamins tablet Take 1 tablet by mouth daily.    Yes [provider]  calcium-vitamin D (OSCAL WITH D) 500-200 MG-UNIT per tablet Take 1 tablet by mouth daily with breakfast.    Yes [provider]  Carboxymethylcellulose Sodium (ARTIFICIAL TEARS OP) Place 1 drop into both eyes 4 (four) times daily.   Yes [provider]  collagenase (SANTYL) ointment Apply 1 application topically daily as needed (wound care).    Yes [provider]  cycloSPORINE (RESTASIS) 0.05 % ophthalmic emulsion USE 1 DROP INTO BOTH EYES TWICE DAILY Patient taking differently: Place 1 drop into both eyes 2 (two) times daily.  01/29/17  Yes Eulas Post, [place], DO  feeding supplement (OSMOLITE 1 CAL) LIQD Take 711 mLs by mouth See admin instructions. For 12 hours 2100 - 0900   Yes [provider]  ferrous sulfate 325 (65 FE) MG tablet TAKE 1 TABLET BY MOUTH EVERY DAY Patient taking differently: Take 325 mg by mouth daily.  11/01/18  Yes [name], NP  fluticasone (FLONASE) 50 MCG/ACT nasal spray SPRAY 2 SPRAYS INTO EACH NOSTRIL EVERY DAY Patient taking differently: Place 2 sprays into both nostrils daily.  10/14/18  Yes [name], NP  furosemide (LASIX) 40 MG tablet TAKE 1 TABLET BY MOUTH EVERY DAY Patient taking differently: Take 40 mg by mouth daily.  04/12/18  Yes Dorothy Spark, MD  gabapentin (NEURONTIN) 300 MG capsule TAKE 1 CAPSULE BY MOUTH TWICE A DAY Patient taking differently: Take 300 mg by mouth 2 (two) times daily.  07/26/18  Yes [name], NP  gentamicin cream (GARAMYCIN) 0.1 % Apply 1 application topically 3 (three) times daily as needed (dry skin).    Yes [provider]  hydrALAZINE  (APRESOLINE) 50 MG tablet  TAKE 1 TABLET BY MOUTH THREE TIMES A DAY Patient taking differently: Take 50 mg by mouth 3 (three) times daily.  07/22/18  Yes Dorothy Spark, MD  insulin aspart (NOVOLOG) 100 UNIT/ML FlexPen Inject 0-6 Units into the skin 3 (three) times daily with meals. New sliding Scale If BS is 151 - 251 = 2 units,  If BS is 251 -  350 = 4 units,  If BS is 351 - 450 = 6 units Call provider if BS runs more than 400   Yes [provider]  insulin glargine (LANTUS) 100 UNIT/ML injection Inject 8 Units into the skin at bedtime.    Yes [provider]  ipratropium-albuterol (DUONEB) 0.5-2.5 (3) MG/3ML SOLN Take 3 mLs by nebulization every 6 (six) hours as needed (sob). Use 3 times daily x 4 days then every 6 hours as needed. Patient taking differently: Take 3 mLs by nebulization every 6 (six) hours as needed (sob).  03/21/16  Yes Eileen [place], PA-C  isosorbide mononitrate (IMDUR) 30 MG 24 hr tablet Take 2 tablets (60 mg total) by mouth daily. Please make annual appt with Dr. Meda Coffee for future refills. Thank you. 1st attempt. Patient taking differently: Take 60 mg by mouth daily.  10/03/18  Yes Dorothy Spark, MD  latanoprost (XALATAN) 0.005 % ophthalmic solution Place 1 drop into both eyes at bedtime. 06/02/14  Yes Blanchie Serve, MD  levalbuterol [facility] HFA) 45 MCG/ACT inhaler Inhale 2 puffs into the lungs every 6 (six) hours as needed for wheezing or shortness of breath. 02/03/16  Yes Gildardo Cranker, DO  levothyroxine (SYNTHROID, LEVOTHROID) 125 MCG tablet Take 1 tablet (125 mcg total) by mouth daily. 04/18/18  Yes [name], NP  loratadine (CLARITIN) 10 MG tablet Take 10 mg by mouth daily as needed for allergies.   Yes [provider]  mometasone-formoterol (DULERA) 200-5 MCG/ACT AERO TAKE TWO PUFFS BY MOUTH TWICE DAILY Patient taking differently: Inhale 2 puffs into the lungs 2 (two) times daily.  11/11/18  Yes [name], NP  Multiple  Vitamin (MULTIVITAMIN WITH MINERALS) TABS tablet Take 1 tablet by mouth daily.   Yes [provider]  nystatin (MYCOSTATIN) 100000 UNIT/ML suspension Take 5 mLs (500,000 Units total) by mouth 4 (four) times daily. 12/02/18  Yes [name], NP  nystatin (MYCOSTATIN/NYSTOP) powder Apply 1 g topically as needed (skin irritation).    Yes [provider]  oxyCODONE (ROXICODONE) 5 MG immediate release tablet Take 1 tablet (5 mg total) by mouth every 4 (four) hours as needed for severe pain. May take 1/2 tab q4hrs as needed for mild pain. Patient taking differently: Take 2.5-5 mg by mouth every 4 (four) hours as needed for severe pain.  06/14/18  Yes [name], NP  OXYGEN Inhale 2 L into the lungs at bedtime.   Yes [provider]  pantoprazole (PROTONIX) 40 MG tablet TAKE 1 TABLET BY MOUTH TWICE A DAY Patient taking differently: Take 40 mg by mouth 2 (two) times daily.  12/24/18  Yes [name], NP  sertraline (ZOLOFT) 100 MG tablet TAKE 1 TABLET BY MOUTH EVERY DAY FOR DEPRESSION Patient taking differently: Take 100 mg by mouth daily.  04/17/18  Yes [name], NP  warfarin (COUMADIN) 2.5 MG tablet TAKE AS DIRECTED BY COUMADIN CLINIC. Patient taking differently: Take 5-7.5 mg by mouth See admin instructions. Take 5mg  by mouth daily, except on Sunday, take 7.5mg  by mouth 09/02/18  Yes Dorothy Spark,  MD  Water For Irrigation, Sterile (FREE WATER) SOLN Place 150 mLs into feeding tube 3 (three) times daily.    Yes [provider]  White Petrolatum-Mineral Oil (SYSTANE NIGHTTIME) OINT Place 1 application into both eyes at bedtime.    Yes [provider]  Insulin Pen Needle (B-D UF III MINI PEN NEEDLES) 31G X 5 MM MISC Use twice daily with giving insulin injections. Dx E11.9 07/17/18   [name], NP    Family History Family History  Problem Relation Age of Onset   Stroke Mother    Hypertension Mother    Diabetes Brother     Glaucoma Son    Glaucoma Son    Heart attack Neg Hx    Colon cancer Neg Hx    Stomach cancer Neg Hx     Social History Social History   Tobacco Use   Smoking status: Never Smoker   Smokeless tobacco: Former Systems developer    Types: Snuff   Tobacco comment: "used snuff in my younger days"  Substance Use Topics   Alcohol use: Never    Alcohol/week: 0.0 standard drinks    Frequency: Never   Drug use: Never     Allergies   Penicillins   Review of Systems Review of Systems  Gastrointestinal: Positive for abdominal pain, constipation, nausea, rectal pain and vomiting.  All other systems reviewed and are negative.    Physical Exam Updated Vital Signs BP (!) 177/72    Pulse 74    Temp 98.2 F (36.8 C) (Oral)    Resp 18    SpO2 100%   Physical Exam Vitals signs and nursing note reviewed. Exam conducted with a chaperone present.  Constitutional:      Appearance: Normal appearance.  HENT:     Head: Normocephalic and atraumatic.     Right Ear: External ear normal.     Left Ear: External ear normal.     Nose: Nose normal.     Mouth/Throat:     Mouth: Mucous membranes are moist.     Pharynx: Oropharynx is clear.  Eyes:     Extraocular Movements: Extraocular movements intact.     Conjunctiva/sclera: Conjunctivae normal.     Pupils: Pupils are equal, round, and reactive to light.  Neck:     Musculoskeletal: Normal range of motion and neck supple.  Cardiovascular:     Rate and Rhythm: Normal rate and regular rhythm.     Pulses: Normal pulses.     Heart sounds: Normal heart sounds.  Pulmonary:     Effort: Pulmonary effort is normal.     Breath sounds: Normal breath sounds.  Abdominal:     General: Abdomen is flat. Bowel sounds are normal.     Palpations: Abdomen is soft.  Genitourinary:    Rectum: Guaiac result positive.     Comments: Rectum tender.  No hemorrhoids or impaction. Musculoskeletal: Normal range of motion.  Skin:    General: Skin is warm.      Capillary Refill: Capillary refill takes less than 2 seconds.  Neurological:     General: No focal deficit present.     Mental Status: She is alert and oriented to person, place, and time.  Psychiatric:        Mood and Affect: Mood normal.        Behavior: Behavior normal.      ED Treatments / Results  Labs (all labs ordered are listed, but only abnormal results are displayed) Labs Reviewed  COMPREHENSIVE  METABOLIC PANEL - Abnormal; Notable for the following components:      Result Value   BUN 61 (*)    Creatinine, Ser 1.48 (*)    GFR calc non Af Amer 30 (*)    GFR calc Af Amer 35 (*)    All other components within normal limits  CBC - Abnormal; Notable for the following components:   RBC 3.27 (*)    Hemoglobin 10.1 (*)    HCT 30.9 (*)    All other components within normal limits  PROTIME-INR - Abnormal; Notable for the following components:   Prothrombin Time 27.9 (*)    INR 2.7 (*)    All other components within normal limits  POC OCCULT BLOOD, ED - Abnormal; Notable for the following components:   Fecal Occult Bld POSITIVE (*)    All other components within normal limits  SARS CORONAVIRUS 2 (HOSPITAL ORDER, [facility])  TYPE AND SCREEN    EKG None  Radiology Ct Abdomen Pelvis W Contrast  Result Date: 12/25/2018 CLINICAL DATA:  Acute abdominal pain and rectal pain. Blood in stools for 1 week. EXAM: CT ABDOMEN AND PELVIS WITH CONTRAST TECHNIQUE: Multidetector CT imaging of the abdomen and pelvis was performed using the standard protocol following bolus administration of intravenous contrast. CONTRAST:  85mL OMNIPAQUE IOHEXOL 300 MG/ML  SOLN COMPARISON:  CT scan of the abdomen dated 06/06/2017 and abdominal radiographs dated 12/25/2018 FINDINGS: Lower chest: No acute abnormality. Aortic atherosclerosis. Hepatobiliary: Multiple small hepatic cysts, unchanged. Cholecystectomy. No dilated bile ducts. Pancreas: Unremarkable. No pancreatic ductal  dilatation or surrounding inflammatory changes. Spleen: Normal in size without focal abnormality. Adrenals/Urinary Tract: Adrenal glands and kidneys are normal except for a 12 mm cyst on the lower pole of the left kidney. No hydronephrosis. Bladder is normal. Stomach/Bowel: There are diverticula throughout the colon. Gastrostomy tube in place. The tip of the tube is in the proximal jejunum in the left upper quadrant. No dilated bowel. Appendix is not visualized. Surgical staple lines noted in several areas of the bowel. Vascular/Lymphatic: Aortic atherosclerosis. No enlarged abdominal or pelvic lymph nodes. Reproductive: Uterus and bilateral adnexa are unremarkable. Other: There are periumbilical hernias to the right and left of midline. The hernia to the right contains large and small bowel loops without evidence of obstruction. The hernia to the left of midline contains only peritoneal fat. No significant change since the prior study. Musculoskeletal: No acute abnormalities. Degenerative disc and joint disease in the lower lumbar spine. Old mild compression fracture of T11. IMPRESSION: 1. No acute abnormalities of the abdomen or pelvis. 2. Diverticulosis of the colon. 3. Periumbilical hernias to the right and left of midline containing large and small bowel without evidence of obstruction. 4. Aortic atherosclerosis. Aortic Atherosclerosis (ICD10-I70.0). Electronically Signed   By: Lorriane Shire M.D.   On: 12/25/2018 16:38   Dg Abdomen Acute W/chest  Result Date: 12/25/2018 CLINICAL DATA:  Constipation EXAM: DG ABDOMEN ACUTE W/ 1V CHEST COMPARISON:  June 06, 2017 FINDINGS: There is elevation of the right hemidiaphragm. There is no pneumothorax. No large pleural effusion. No focal infiltrate. Aortic calcifications are noted. The bowel gas pattern is nonobstructive. A gastrojejunostomy is noted. The tube appears well position. Surgical staples are noted in the left lower quadrant. There are degenerative changes  throughout the visualized portions of the thoracolumbar spine. IMPRESSION: 1. No evidence of acute cardiopulmonary process. 2. Nonobstructive bowel gas pattern. 3. Stable elevation of the right hemidiaphragm. 4. Gastrojejunostomy tube appears  well position. Electronically Signed   By: Constance Holster M.D.   On: 12/25/2018 16:02    Procedures Procedures (including critical care time)  Medications Ordered in ED Medications  famotidine (PEPCID) IVPB 20 mg premix (has no administration in time range)  sodium chloride 0.9 % bolus 500 mL ( Intravenous Stopped 12/25/18 1634)  iohexol (OMNIPAQUE) 300 MG/ML solution 80 mL (80 mLs Intravenous Contrast Given 12/25/18 1627)  sodium chloride (PF) 0.9 % injection (  Given by Other 12/25/18 1616)     Initial Impression / Assessment and Plan / ED Course  I have reviewed the triage vital signs and the nursing notes.  Pertinent labs & imaging results that were available during my care of the patient were reviewed by me and considered in my medical decision making (see chart for details).   Pt's hemoglobin is stable.  She is not constipated.  She does have some diverticulosis on CT scan.  She is anticoagulated on coumadin.   Pt does have some epigastric tenderness, but no melena.  I will give her a dose of pepcid iv.  The pt is d/w Dr. Wyline Copas ([facility]) for obs to watch her hgb.   Final Clinical Impressions(s) / ED Diagnoses   Final diagnoses:  Rectal bleeding  Epigastric pain  Anticoagulated on Coumadin    ED Discharge Orders    None       Isla Pence, MD 12/25/18 1740

## 2018-12-25 NOTE — Patient Instructions (Signed)
FOBT positive for blood.please go to the ED for further evaluation of Hematemesis and Nausea.

## 2018-12-25 NOTE — Progress Notes (Signed)
Provider: Daisa Stennis FNP-C  [name], NP  Patient Care Team: [name], NP as PCP - General (Geriatric Medicine) Dorothy Spark, MD as PCP - Cardiology (Cardiology) Edrick Kins, DPM as Consulting Physician (Podiatry) Dorothy Spark, MD as Consulting Physician (Cardiology) [name] [name], NP as Nurse Practitioner (Geriatric Medicine) Camillo Flaming, [place] as Referring Physician (Optometry) Tobi Bastos, RN as [place] Management  Extended Emergency Contact Information Primary Emergency Contact: Armstrong,[name] Address: [address]          [place], [place] [address] Johnnette Litter of [place] Phone: [phone] Mobile Phone: [phone] Relation: Daughter Secondary Emergency Contact: Ciani,John  [place] Phone: [phone] Relation: Son  Code Status:  DNR Goals of care: Advanced Directive information Advanced Directives 11/25/2018  Does Patient Have a Medical Advance Directive? No  Type of Advance Directive -  Does patient want to make changes to medical advance directive? -  Copy of [place] in Chart? -  Would patient like information on creating a medical advance directive? No - Patient declined     Chief Complaint  Patient presents with  . Acute Visit    Patient c/o of rectum raw, hurting with some bleeding. Patient states this started last week patient states she is dealing with constipation. Patient states she is also having a hard time swallowing medications and feeling nauseous for about 2 or 3 weeks      HPI:  Pt is a 85 y.o. female seen today at [facility] office for an acute visit for complains of rectum "rawness ", hurting with some bleeding X 1 week.she states has been dealing with constipation.Also states having a hard time swallowing medications and has nauseous for about 2 or 3 weeks.she has been vomiting and spitting out blood.also complains of epigastric  pain.she gets tube feeding from 9 pm to 9 Am.Also eats soft foods such as Jello and apple sauce.she was not able to eat anything this morning due to nausea.she had last bowel movement today.Rectum feels sore.she denies any dark stool, bright red blood or hemorrhoids.No fever or chills.Daughter here with her states patient has been more weak not getting around as usual.       Past Medical History:  Diagnosis Date  . Acute respiratory failure ([place])   . Anemia    previous blood transfusions  . Arthritis    "all over"  . Asthma   . Atrial flutter ([place])   . Bradycardia    requiring previous d/c of BB and reduction of amiodarone  . CAD (coronary artery disease)    nonobstructive per notes  . Chronic diastolic CHF (congestive heart failure) ([place])   . CKD (chronic kidney disease), stage III ([place])   . Complication of blood transfusion    "got the wrong blood type at [facility] in ~ 2015; no adverse reaction that we are aware of"/daughter, [name] (01/27/2016)  . COPD (chronic obstructive pulmonary disease) ([place])   . Depression    "light case"  . DVT (deep venous thrombosis) ([place]) 01/2016   a. LLE DVT 01/2016 - switched from Eliquis to Coumadin.  [name] Dyspnea    with some activity  . Gastric stenosis    a. s/p stomach tube  . GERD (gastroesophageal reflux disease)   . History of blood transfusion    "several" (01/27/2016)  . History of stomach ulcers   . Hyperlipidemia   . Hypertension   . Hypothyroidism   .  On home oxygen therapy    "2L; 9PM til 9AM" (06/27/2017)  . PAD (peripheral artery disease) ([place])    a. prior gangrene, toe amputations, intervention.  [name] PAF (paroxysmal atrial fibrillation) ([place])   . Paraesophageal hernia   . Perforated gastric ulcer ([place])   . Pneumonia    "a few times" (06/27/2017)  . Seasonal allergies   . SIADH (syndrome of inappropriate ADH production) ([place])    [name] 01/10/2015  . Small bowel obstruction ([place])    "I don't know how many" (01/11/2015)  . Stroke  [facility])    several- no residual  . Type II diabetes mellitus ([place])    "related to prednisone use  for > 20 yr; once predinose stopped; no more DM RX" (01/27/2016)  . UTI (urinary tract infection) 02/08/2016  . Ventral hernia with bowel obstruction    Past Surgical History:  Procedure Laterality Date  . ABDOMINAL AORTOGRAM N/A 09/21/2016   Procedure: Abdominal Aortogram;  Surgeon: Waynetta Sandy, MD;  Location: [place] [facility];  Service: Cardiovascular;  Laterality: N/A;  . AMPUTATION Left 09/25/2016   Procedure: AMPUTATION DIGIT- LEFT 2ND AND 3RD TOES;  Surgeon: Rosetta Posner, MD;  Location: [place];  Service: Vascular;  Laterality: Left;  . AMPUTATION Right 05/03/2018   Procedure: AMPUTATION RIGHT GREAT TOE;  Surgeon: Rosetta Posner, MD;  Location: [place];  Service: Vascular;  Laterality: Right;  . CATARACT EXTRACTION W/ INTRAOCULAR LENS  IMPLANT, BILATERAL    . CHOLECYSTECTOMY OPEN    . COLECTOMY    . ESOPHAGOGASTRODUODENOSCOPY N/A 01/19/2014   Procedure: ESOPHAGOGASTRODUODENOSCOPY (EGD);  Surgeon: Irene Shipper, MD;  Location: [facility];  Service: Endoscopy;  Laterality: N/A;  . ESOPHAGOGASTRODUODENOSCOPY N/A 01/20/2014   Procedure: ESOPHAGOGASTRODUODENOSCOPY (EGD);  Surgeon: Irene Shipper, MD;  Location: [facility];  Service: Endoscopy;  Laterality: N/A;  . ESOPHAGOGASTRODUODENOSCOPY N/A 03/19/2014   Procedure: ESOPHAGOGASTRODUODENOSCOPY (EGD);  Surgeon: Milus Banister, MD;  Location: [facility];  Service: Endoscopy;  Laterality: N/A;  . ESOPHAGOGASTRODUODENOSCOPY N/A 07/08/2015   Procedure: ESOPHAGOGASTRODUODENOSCOPY (EGD);  Surgeon: Doran Stabler, MD;  Location: [facility] ENDOSCOPY;  Service: Endoscopy;  Laterality: N/A;  . ESOPHAGOGASTRODUODENOSCOPY (EGD) WITH PROPOFOL N/A 09/15/2015   Procedure: ESOPHAGOGASTRODUODENOSCOPY (EGD) WITH PROPOFOL;  Surgeon: Manus Gunning, MD;  [facility];  Service: Gastroenterology;  Laterality: N/A;  . GASTROJEJUNOSTOMY      hx/notes 01/10/2015  . GASTROJEJUNOSTOMY N/A 09/23/2015   Procedure: OPEN GASTROJEJUNOSTOMY TUBE PLACEMENT;  Surgeon: Arta Bruce Kinsinger, MD;  [facility];  Service: General;  Laterality: N/A;  . GLAUCOMA SURGERY Bilateral   . HERNIA REPAIR  2015  . IR CM INJ ANY COLONIC TUBE W/FLUORO  01/05/2017  . IR CM INJ ANY COLONIC TUBE W/FLUORO  06/07/2017  . IR CM INJ ANY COLONIC TUBE W/FLUORO  11/05/2017  . IR CM INJ ANY COLONIC TUBE W/FLUORO  09/18/2018  . IR GENERIC HISTORICAL  01/07/2016   IR GJ TUBE CHANGE 01/07/2016 Jacqulynn Cadet, MD [facility]  . IR GENERIC HISTORICAL  01/27/2016   IR MECH REMOV OBSTRUC MAT ANY COLON TUBE W/FLUORO 01/27/2016 Markus Daft, MD [facility]  . IR GENERIC HISTORICAL  02/07/2016   IR PATIENT EVAL TECH 0-60 MINS Darrell K Allred, PA-C [facility]  . IR GENERIC HISTORICAL  02/08/2016   IR GJ TUBE CHANGE 02/08/2016 Greggory Keen, MD [facility]  . IR GENERIC HISTORICAL  01/06/2016   IR GJ TUBE CHANGE 01/06/2016 [facility] OUT REF  . IR GENERIC HISTORICAL  05/02/2016   IR CM INJ ANY COLONIC TUBE W/FLUORO 05/02/2016 Arne Cleveland, MD [facility]  . IR GENERIC HISTORICAL  05/15/2016   IR GJ TUBE CHANGE 05/15/2016 Sandi Mariscal, MD [facility]  . IR GENERIC HISTORICAL  06/28/2016   IR GJ TUBE CHANGE 06/28/2016 [facility]  . IR GJ TUBE CHANGE  02/20/2017  . IR GJ TUBE CHANGE  05/10/2017  . IR GJ TUBE CHANGE  07/06/2017  . IR GJ TUBE CHANGE  08/02/2017  . IR GJ TUBE CHANGE  10/15/2017  . IR GJ TUBE CHANGE  12/26/2017  . IR [place] TUBE CHANGE  04/08/2018  . IR [place] TUBE CHANGE  06/19/2018  . IR PATIENT EVAL TECH 0-60 MINS  10/19/2016  . IR PATIENT EVAL TECH 0-60 MINS  12/25/2016  . IR PATIENT EVAL TECH 0-60 MINS  01/29/2017  . IR PATIENT EVAL TECH 0-60 MINS  04/04/2017  . IR PATIENT EVAL TECH 0-60 MINS  04/30/2017  . IR PATIENT EVAL TECH 0-60 MINS  07/31/2017  . IR REPLC DUODEN/JEJUNO TUBE PERCUT W/FLUORO  11/14/2016  . LAPAROTOMY N/A 01/20/2015   Procedure: EXPLORATORY LAPAROTOMY;   Surgeon: Coralie Keens, MD;  Location: [place];  Service: General;  Laterality: N/A;  . LOWER EXTREMITY ANGIOGRAPHY Left 09/21/2016   Procedure: Lower Extremity Angiography;  Surgeon: Waynetta Sandy, MD;  Location: [place] [facility];  Service: Cardiovascular;  Laterality: Left;  . LOWER EXTREMITY ANGIOGRAPHY Right 06/27/2017   Procedure: Lower Extremity Angiography;  Surgeon: Waynetta Sandy, MD;  Location: [place] [facility];  Service: Cardiovascular;  Laterality: Right;  . LYSIS OF ADHESION N/A 01/20/2015   Procedure: LYSIS OF ADHESIONS < 1 HOUR;  Surgeon: Coralie Keens, MD;  Location: [place];  Service: General;  Laterality: N/A;  . PERIPHERAL VASCULAR BALLOON ANGIOPLASTY Left 09/21/2016   Procedure: Peripheral Vascular Balloon Angioplasty;  Surgeon: Waynetta Sandy, MD;  Location: [place] [facility];  Service: Cardiovascular;  Laterality: Left;  drug coated balloon  . PERIPHERAL VASCULAR BALLOON ANGIOPLASTY Right 06/27/2017   Procedure: PERIPHERAL VASCULAR BALLOON ANGIOPLASTY;  Surgeon: Waynetta Sandy, MD;  Location: [place] [facility];  Service: Cardiovascular;  Laterality: Right;  SFA/TPTRUNK  . TONSILLECTOMY    . TUBAL LIGATION    . VENTRAL HERNIA REPAIR  2015   incarcerated ventral hernia ([facility] 09/2013)/notes 01/10/2015    Allergies  Allergen Reactions  . Penicillins Itching, Rash and Other (See Comments)    Tolerated amoxicillin, unasyn, zosyn & cephalosporins in the past. Did it involve swelling of the face/tongue/throat, SOB, or low BP? No Did it involve sudden or severe rash/hives, skin peeling, or any reaction on the inside of your mouth or nose? No Did you need to seek medical attention at a hospital or doctor's office? Unknown When did it last happen?5+ years If all above answers are "NO", may proceed with cephalosporin use.     Outpatient Encounter Medications as of 12/25/2018  Medication Sig  . acetaminophen (TYLENOL) 500 MG  tablet Take 500 mg by mouth every 6 (six) hours as needed (pain).   . Amino Acids-Protein Hydrolys (FEEDING SUPPLEMENT, PRO-STAT SUGAR FREE 64,) LIQD Take 30 mLs by mouth 2 (two) times daily.  [name] amLODipine (NORVASC) 5 MG tablet Take 1 tablet (5 mg total) by mouth daily.  [name] atorvastatin (LIPITOR) 10 MG tablet Take 1 tablet (10 mg total) by mouth daily.  [name] b complex vitamins tablet Take 1 tablet by mouth daily.   . calcium-vitamin D (OSCAL WITH  D) 500-200 MG-UNIT per tablet Take 1 tablet by mouth daily with breakfast.   . Carboxymethylcellulose Sodium (ARTIFICIAL TEARS OP) Place 1 drop into both eyes 4 (four) times daily.  . collagenase (SANTYL) ointment Apply 1 application topically daily as needed (wound care).   . cycloSPORINE (RESTASIS) 0.05 % ophthalmic emulsion USE 1 DROP INTO BOTH EYES TWICE DAILY  . feeding supplement (OSMOLITE 1 CAL) LIQD Take 711 mLs by mouth See admin instructions. For 12 hours 2100 - 0900  . ferrous sulfate 325 (65 FE) MG tablet TAKE 1 TABLET BY MOUTH EVERY DAY  . fluticasone (FLONASE) 50 MCG/ACT nasal spray SPRAY 2 SPRAYS INTO EACH NOSTRIL EVERY DAY  . furosemide (LASIX) 40 MG tablet TAKE 1 TABLET BY MOUTH EVERY DAY  . gabapentin (NEURONTIN) 300 MG capsule TAKE 1 CAPSULE BY MOUTH TWICE A DAY  . gentamicin cream (GARAMYCIN) 0.1 % Apply 1 application topically 3 (three) times daily as needed (dry skin).   . hydrALAZINE (APRESOLINE) 50 MG tablet TAKE 1 TABLET BY MOUTH THREE TIMES A DAY  . insulin aspart (NOVOLOG) 100 UNIT/ML FlexPen Inject 0-6 Units into the skin 3 (three) times daily with meals. New sliding Scale If BS is 151 - 251 = 2 units,  If BS is 251 -  350 = 4 units,  If BS is 351 - 450 = 6 units Call provider if BS runs more than 400  . insulin glargine (LANTUS) 100 UNIT/ML injection Inject 8 Units into the skin at bedtime.   . Insulin Pen Needle (B-D UF III MINI PEN NEEDLES) 31G X 5 MM MISC Use twice daily with giving insulin injections. Dx E11.9  .  ipratropium-albuterol (DUONEB) 0.5-2.5 (3) MG/3ML SOLN Take 3 mLs by nebulization every 6 (six) hours as needed (sob). Use 3 times daily x 4 days then every 6 hours as needed.  . isosorbide mononitrate (IMDUR) 30 MG 24 hr tablet Take 2 tablets (60 mg total) by mouth daily. Please make annual appt with Dr. Meda Coffee for future refills. Thank you. 1st attempt.  . latanoprost (XALATAN) 0.005 % ophthalmic solution Place 1 drop into both eyes at bedtime.  . levalbuterol (XOPENEX HFA) 45 MCG/ACT inhaler Inhale 2 puffs into the lungs every 6 (six) hours as needed for wheezing or shortness of breath.  . levothyroxine (SYNTHROID, LEVOTHROID) 125 MCG tablet Take 1 tablet (125 mcg total) by mouth daily.  [name] loratadine (CLARITIN) 10 MG tablet Take 10 mg by mouth daily as needed for allergies.  . mometasone-formoterol (DULERA) 200-5 MCG/ACT AERO TAKE TWO PUFFS BY MOUTH TWICE DAILY  . Multiple Vitamin (MULTIVITAMIN WITH MINERALS) TABS tablet Take 1 tablet by mouth daily.  [name] nystatin (MYCOSTATIN) 100000 UNIT/ML suspension Take 5 mLs (500,000 Units total) by mouth 4 (four) times daily.  [name] nystatin (MYCOSTATIN/NYSTOP) powder Apply 1 g topically as needed (skin irritation).   [name] oxyCODONE (ROXICODONE) 5 MG immediate release tablet Take 1 tablet (5 mg total) by mouth every 4 (four) hours as needed for severe pain. May take 1/2 tab q4hrs as needed for mild pain.  . OXYGEN Inhale 2 L into the lungs at bedtime.  . pantoprazole (PROTONIX) 40 MG tablet TAKE 1 TABLET BY MOUTH TWICE A DAY  . sertraline (ZOLOFT) 100 MG tablet TAKE 1 TABLET BY MOUTH EVERY DAY FOR DEPRESSION  . warfarin (COUMADIN) 2.5 MG tablet TAKE AS DIRECTED BY COUMADIN CLINIC.  [name] Water For Irrigation, Sterile (FREE WATER) SOLN Place 150 mLs into feeding tube 3 (three) times daily.   [name]  White Petrolatum-Mineral Oil (SYSTANE NIGHTTIME) OINT Place 1 application into both eyes at bedtime.   . [DISCONTINUED] azithromycin (ZITHROMAX) 250 MG tablet 2 tablet today and 1  tablet daily until complete   No facility-administered encounter medications on file as of 12/25/2018.     Review of Systems  Constitutional: Positive for fatigue. Negative for appetite change, chills and fever.  HENT: Positive for trouble swallowing. Negative for congestion, rhinorrhea, sinus pressure, sinus pain, sneezing and sore throat.        " feels like pills get stuck"on lower mid chest area.  Eyes: Positive for visual disturbance. Negative for discharge, redness and itching.       Wears corrective lens   Respiratory: Negative for cough, chest tightness, shortness of breath and wheezing.   Cardiovascular: Positive for leg swelling. Negative for chest pain and palpitations.  Gastrointestinal: Positive for abdominal pain, constipation, nausea and vomiting. Negative for abdominal distention, blood in stool and diarrhea.  Musculoskeletal: Positive for arthralgias, back pain and gait problem.  Skin: Negative for color change, pallor and rash.  Neurological: Positive for weakness. Negative for dizziness, light-headedness, numbness and headaches.  Psychiatric/Behavioral: Negative for agitation and sleep disturbance. The patient is not nervous/anxious.     Immunization History  Administered Date(s) Administered  . Fluad Quad(high Dose 65+) 12/02/2018  . Influenza, High Dose Seasonal PF 01/19/2017  . Influenza,inj,Quad PF,6+ Mos 12/24/2013, 01/27/2015, 12/29/2015, 12/04/2017  . Influenza-Unspecified 12/02/2012  . Pneumococcal Conjugate-13 02/03/2014  . Pneumococcal Polysaccharide-23 08/31/2016  . Tdap 07/18/2016  . Zoster 04/12/2011   Pertinent  Health Maintenance Due  Topic Date Due  . FOOT EXAM  08/22/2017  . HEMOGLOBIN A1C  04/17/2019  . OPHTHALMOLOGY EXAM  12/16/2019  . INFLUENZA VACCINE  Completed  . DEXA SCAN  Completed  . PNA vac Low Risk Adult  Completed   Fall Risk  10/10/2018 08/27/2018 08/07/2018 05/14/2018 04/17/2018  Falls in the past year? 0 0 0 0 0  Number falls in  past yr: 0 0 - 0 -  Injury with Fall? 0 0 - 0 -  Risk for fall due to : - - - - -    Vitals:   12/25/18 1129  BP: (!) 142/64  Pulse: 74  Temp: 98.4 F (36.9 C)  TempSrc: Temporal  SpO2: 98%  Weight: 143 lb 6.4 oz (65 kg)  Height: '5\' 6"'  (1.676 m)   Body mass index is 23.15 kg/m. Physical Exam Constitutional:      General: She is not in acute distress.    Appearance: She is normal weight. She is not ill-appearing.  HENT:     Head: Normocephalic.     Comments: Hearing aids in place     Mouth/Throat:     Mouth: Mucous membranes are moist.     Pharynx: Oropharynx is clear. No oropharyngeal exudate or posterior oropharyngeal erythema.  Eyes:     General: No scleral icterus.       Right eye: No discharge.        Left eye: No discharge.     Conjunctiva/sclera: Conjunctivae normal.     Pupils: Pupils are equal, round, and reactive to light.     Comments: Corrective lens in place  Cardiovascular:     Rate and Rhythm: Normal rate and regular rhythm.     Pulses: Normal pulses.     Heart sounds: Murmur present. No friction rub. No gallop.   Pulmonary:     Effort: Pulmonary effort is normal. No respiratory distress.  Breath sounds: No rhonchi or rales.     Comments: Diminished expiratory wheezes  Chest:     Chest wall: No tenderness.  Abdominal:     General: Bowel sounds are normal. There is no distension.     Palpations: Abdomen is soft. There is no mass.     Tenderness: There is abdominal tenderness. There is no right CVA tenderness, left CVA tenderness or rebound.     Comments: Epigastric and LUQ tenderness.PEG tube dressing dry,clean and intact.  Genitourinary:    Rectum: Normal. Guaiac result positive.  Musculoskeletal:        General: No swelling or tenderness.     Comments: Unsteady gait ambulates with Rolator.bilateral lower extremities trace edema.   Skin:    General: Skin is warm and dry.     Coloration: Skin is not pale.     Findings: No bruising, erythema or  rash.     Comments: LUQ  Peg tube dry,clean and intact   Neurological:     Mental Status: She is alert. Mental status is at baseline.     Cranial Nerves: No cranial nerve deficit.     Sensory: No sensory deficit.     Coordination: Coordination normal.     Gait: Gait abnormal.     Comments: Generalized weakness   Psychiatric:        Mood and Affect: Mood normal.        Behavior: Behavior normal.        Thought Content: Thought content normal.        Judgment: Judgment normal.     Provider: Tres Grzywacz FNP-C  [name], NP  Patient Care Team: [name], NP as PCP - General (Geriatric Medicine) Dorothy Spark, MD as PCP - Cardiology (Cardiology) Edrick Kins, DPM as Consulting Physician (Podiatry) Dorothy Spark, MD as Consulting Physician (Cardiology) [name], NP as Nurse Practitioner (Geriatric Medicine) Camillo Flaming, [place] as Referring Physician (Optometry) Tobi Bastos, RN as [place] Management  Extended Emergency Contact Information Primary Emergency Contact: Armstrong,[name] Address: [address]          [place], [place] [address] Johnnette Litter of [place] Phone: [phone] Mobile Phone: [phone] Relation: Daughter Secondary Emergency Contact: Josie Dixon [place] of [place] Phone: [phone] Relation: Son  Goals of care: Advanced Directive information Advanced Directives 11/25/2018  Does Patient Have a Medical Advance Directive? No  Type of Advance Directive -  Does patient want to make changes to medical advance directive? -  Copy of [place] in Chart? -  Would patient like information on creating a medical advance directive? No - Patient declined     Chief Complaint  Patient presents with  . Acute Visit    Patient c/o of rectum raw, hurting with some bleeding. Patient states this started last week patient states she is dealing with constipation.  Patient states she is also having a hard time swallowing medications and feeling nauseous for about 2 or 3 weeks      HPI:  Pt is a 85 y.o. female seen today for an acute visit for follow up hospital admission from 05/03/2018-05/09/2018 for right foot acute osteomyelitis.she had Gangrene on right great toe underwent  amputation of right great toe.Ambulates with Darco shoe. She states started physical therapy 05/13/2018.Has post-op  follow up appointment with Dr.Early vascular surgeon 05/28/2018.    Type 2 DM- states CBG has improved compared to prior to surgery.No  log brought to visit.on Lantus 10 units daily at bedtime and Novolog three times with meals.  CKD stage 3 - CR 2.18 on hospital admission but improved to 1.5 with hydration.  Anemia - Hgb 7.9 post right great toe amputation.on ferrous sulfate.Denies any dizziness or headache.     COPD- Oxygen at  2 liter at home but on room air during visit.chronic shortness of breath and wheezing.On Duoneb 3 ml every 6 hours as needed,Levalbuterol 2 puffs every 6 hours as needed,Dulera 2 puffs twice daily.    Dysphagia - Has PEG tube.On Osmolite 1.5 receives 711 ml from 9pm to 9 Am.Also eats pudding,yorgut and cream chicken soup.she denies any signs of aspiration or difficulty swallowing. She has had a 1.4 lbs weight loss since last visit 04/17/2018 possible due to her recent Hospital admission.she states has a good appetite.   Hypertension- B/p stable on hydralazine 50 mg tablet three times daily,coreg 6.25 mg tablet twice daily,Furosemide 40 mg tablet daily and Imdur 60 mg tablet daily.   Hypothyroidism- on levothyroxine 125 mcg tablet daily.Dosage reduced from 137 mcg tablet daily by PCP.   Diastolic CHF - Has chronic shortness of breath with exertion when walking long distance.on hydralazine 50 mg tablet three times daily,coreg 6.25 mg tablet twice daily,Furosemide 40 mg tablet daily and Imdur 60 mg tablet daily.no abrupt weight gain or cough.   Past  Medical History:  Diagnosis Date  . Acute respiratory failure ([place])   . Anemia    previous blood transfusions  . Arthritis    "all over"  . Asthma   . Atrial flutter ([place])   . Bradycardia    requiring previous d/c of BB and reduction of amiodarone  . CAD (coronary artery disease)    nonobstructive per notes  . Chronic diastolic CHF (congestive heart failure) ([place])   . CKD (chronic kidney disease), stage III ([place])   . Complication of blood transfusion    "got the wrong blood type at [facility] in ~ 2015; no adverse reaction that we are aware of"/daughter, [name] (01/27/2016)  . COPD (chronic obstructive pulmonary disease) ([place])   . Depression    "light case"  . DVT (deep venous thrombosis) ([place]) 01/2016   a. LLE DVT 01/2016 - switched from Eliquis to Coumadin.  [name] Dyspnea    with some activity  . Gastric stenosis    a. s/p stomach tube  . GERD (gastroesophageal reflux disease)   . History of blood transfusion    "several" (01/27/2016)  . History of stomach ulcers   . Hyperlipidemia   . Hypertension   . Hypothyroidism   . On home oxygen therapy    "2L; 9PM til 9AM" (06/27/2017)  . PAD (peripheral artery disease) ([place])    a. prior gangrene, toe amputations, intervention.  [name] PAF (paroxysmal atrial fibrillation) ([place])   . Paraesophageal hernia   . Perforated gastric ulcer ([place])   . Pneumonia    "a few times" (06/27/2017)  . Seasonal allergies   . SIADH (syndrome of inappropriate ADH production) ([place])    [name] 01/10/2015  . Small bowel obstruction ([place])    "I don't know how many" (01/11/2015)  . Stroke [facility])    several- no residual  . Type II diabetes mellitus ([place])    "related to prednisone use  for > 20 yr; once predinose stopped; no more DM RX" (01/27/2016)  . UTI (urinary tract infection) 02/08/2016  . Ventral hernia with bowel obstruction    Past Surgical History:  Procedure Laterality Date  . ABDOMINAL AORTOGRAM N/A 09/21/2016   Procedure: Abdominal Aortogram;   Surgeon: Waynetta Sandy, MD;  Location: [place] [facility];  Service: Cardiovascular;  Laterality: N/A;  . AMPUTATION Left 09/25/2016   Procedure: AMPUTATION DIGIT- LEFT 2ND AND 3RD TOES;  Surgeon: Rosetta Posner, MD;  Location: [facility];  Service: Vascular;  Laterality: Left;  . AMPUTATION Right 05/03/2018   Procedure: AMPUTATION RIGHT GREAT TOE;  Surgeon: Rosetta Posner, MD;  Location: [place];  Service: Vascular;  Laterality: Right;  . CATARACT EXTRACTION W/ INTRAOCULAR LENS  IMPLANT, BILATERAL    . CHOLECYSTECTOMY OPEN    . COLECTOMY    . ESOPHAGOGASTRODUODENOSCOPY N/A 01/19/2014   Procedure: ESOPHAGOGASTRODUODENOSCOPY (EGD);  Surgeon: Irene Shipper, MD;  Location: [facility];  Service: Endoscopy;  Laterality: N/A;  . ESOPHAGOGASTRODUODENOSCOPY N/A 01/20/2014   Procedure: ESOPHAGOGASTRODUODENOSCOPY (EGD);  Surgeon: Irene Shipper, MD;  Location: [facility];  Service: Endoscopy;  Laterality: N/A;  . ESOPHAGOGASTRODUODENOSCOPY N/A 03/19/2014   Procedure: ESOPHAGOGASTRODUODENOSCOPY (EGD);  Surgeon: Milus Banister, MD;  Location: [facility];  Service: Endoscopy;  Laterality: N/A;  . ESOPHAGOGASTRODUODENOSCOPY N/A 07/08/2015   Procedure: ESOPHAGOGASTRODUODENOSCOPY (EGD);  Surgeon: Doran Stabler, MD;  Location: [facility] ENDOSCOPY;  Service: Endoscopy;  Laterality: N/A;  . ESOPHAGOGASTRODUODENOSCOPY (EGD) WITH PROPOFOL N/A 09/15/2015   Procedure: ESOPHAGOGASTRODUODENOSCOPY (EGD) WITH PROPOFOL;  Surgeon: Manus Gunning, MD;  Location: [facility]OS[facility];  Service: Gastroenterology;  Laterality: N/A;  . GASTROJEJUNOSTOMY     hx/notes 01/10/2015  . GASTROJEJUNOSTOMY N/A 09/23/2015   Procedure: OPEN GASTROJEJUNOSTOMY TUBE PLACEMENT;  Surgeon: Arta Bruce Kinsinger, MD;  Location: [facility] OR[facility];  Service: General;  Laterality: N/A;  . GLAUCOMA SURGERY Bilateral   . HERNIA REPAIR  2015  . IR CM INJ ANY COLONIC TUBE W/FLUORO  01/05/2017  . IR CM INJ ANY COLONIC TUBE W/FLUORO  06/07/2017  . IR CM INJ ANY  COLONIC TUBE W/FLUORO  11/05/2017  . IR CM INJ ANY COLONIC TUBE W/FLUORO  09/18/2018  . IR GENERIC HISTORICAL  01/07/2016   IR GJ TUBE CHANGE 01/07/2016 Jacqulynn Cadet, MD [facility]  . IR GENERIC HISTORICAL  01/27/2016   IR MECH REMOV OBSTRUC MAT ANY COLON TUBE W/FLUORO 01/27/2016 Markus Daft, MD [facility]  . IR GENERIC HISTORICAL  02/07/2016   IR PATIENT EVAL TECH 0-60 MINS Darrell K Allred, PA-C [facility]  . IR GENERIC HISTORICAL  02/08/2016   IR GJ TUBE CHANGE 02/08/2016 Greggory Keen, MD [facility]  . IR GENERIC HISTORICAL  01/06/2016   IR GJ TUBE CHANGE 01/06/2016 [facility] OUT REF  . IR GENERIC HISTORICAL  05/02/2016   IR CM INJ ANY COLONIC TUBE W/FLUORO 05/02/2016 Arne [place], MD [facility]  . IR GENERIC HISTORICAL  05/15/2016   IR GJ TUBE CHANGE 05/15/2016 Sandi Mariscal, MD [facility]  . IR GENERIC HISTORICAL  06/28/2016   IR GJ TUBE CHANGE 06/28/2016 [facility]  . IR GJ TUBE CHANGE  02/20/2017  . IR GJ TUBE CHANGE  05/10/2017  . IR GJ TUBE CHANGE  07/06/2017  . IR GJ TUBE CHANGE  08/02/2017  . IR GJ TUBE CHANGE  10/15/2017  . IR GJ TUBE CHANGE  12/26/2017  . IR [place] TUBE CHANGE  04/08/2018  . IR [place] TUBE CHANGE  06/19/2018  . IR PATIENT EVAL TECH 0-60 MINS  10/19/2016  . IR PATIENT EVAL TECH 0-60 MINS  12/25/2016  . IR PATIENT EVAL TECH 0-60 MINS  01/29/2017  . IR PATIENT  EVAL TECH 0-60 MINS  04/04/2017  . IR PATIENT EVAL TECH 0-60 MINS  04/30/2017  . IR PATIENT EVAL TECH 0-60 MINS  07/31/2017  . IR REPLC DUODEN/JEJUNO TUBE PERCUT W/FLUORO  11/14/2016  . LAPAROTOMY N/A 01/20/2015   Procedure: EXPLORATORY LAPAROTOMY;  Surgeon: Coralie Keens, MD;  Location: [place];  Service: General;  Laterality: N/A;  . LOWER EXTREMITY ANGIOGRAPHY Left 09/21/2016   Procedure: Lower Extremity Angiography;  Surgeon: Waynetta Sandy, MD;  Location: [place] [facility];  Service: Cardiovascular;  Laterality: Left;  . LOWER EXTREMITY ANGIOGRAPHY Right 06/27/2017   Procedure: Lower Extremity  Angiography;  Surgeon: Waynetta Sandy, MD;  Location: [facility];  Service: Cardiovascular;  Laterality: Right;  . LYSIS OF ADHESION N/A 01/20/2015   Procedure: LYSIS OF ADHESIONS < 1 HOUR;  Surgeon: Coralie Keens, MD;  Location: [place];  Service: General;  Laterality: N/A;  . PERIPHERAL VASCULAR BALLOON ANGIOPLASTY Left 09/21/2016   Procedure: Peripheral Vascular Balloon Angioplasty;  Surgeon: Waynetta Sandy, MD;  Location: [place] [facility];  Service: Cardiovascular;  Laterality: Left;  drug coated balloon  . PERIPHERAL VASCULAR BALLOON ANGIOPLASTY Right 06/27/2017   Procedure: PERIPHERAL VASCULAR BALLOON ANGIOPLASTY;  Surgeon: Waynetta Sandy, MD;  Location: [place] [facility];  Service: Cardiovascular;  Laterality: Right;  SFA/TPTRUNK  . TONSILLECTOMY    . TUBAL LIGATION    . VENTRAL HERNIA REPAIR  2015   incarcerated ventral hernia ([facility] 09/2013)/notes 01/10/2015    Allergies  Allergen Reactions  . Penicillins Itching, Rash and Other (See Comments)    Tolerated amoxicillin, unasyn, zosyn & cephalosporins in the past. Did it involve swelling of the face/tongue/throat, SOB, or low BP? No Did it involve sudden or severe rash/hives, skin peeling, or any reaction on the inside of your mouth or nose? No Did you need to seek medical attention at a hospital or doctor's office? Unknown When did it last happen?5+ years If all above answers are "NO", may proceed with cephalosporin use.     Outpatient Encounter Medications as of 12/25/2018  Medication Sig  . acetaminophen (TYLENOL) 500 MG tablet Take 500 mg by mouth every 6 (six) hours as needed (pain).   . Amino Acids-Protein Hydrolys (FEEDING SUPPLEMENT, PRO-STAT SUGAR FREE 64,) LIQD Take 30 mLs by mouth 2 (two) times daily.  [name] amLODipine (NORVASC) 5 MG tablet Take 1 tablet (5 mg total) by mouth daily.  [name] atorvastatin (LIPITOR) 10 MG tablet Take 1 tablet (10 mg total) by mouth daily.  [name] b complex  vitamins tablet Take 1 tablet by mouth daily.   . calcium-vitamin D (OSCAL WITH D) 500-200 MG-UNIT per tablet Take 1 tablet by mouth daily with breakfast.   . Carboxymethylcellulose Sodium (ARTIFICIAL TEARS OP) Place 1 drop into both eyes 4 (four) times daily.  . collagenase (SANTYL) ointment Apply 1 application topically daily as needed (wound care).   . cycloSPORINE (RESTASIS) 0.05 % ophthalmic emulsion USE 1 DROP INTO BOTH EYES TWICE DAILY  . feeding supplement (OSMOLITE 1 CAL) LIQD Take 711 mLs by mouth See admin instructions. For 12 hours 2100 - 0900  . ferrous sulfate 325 (65 FE) MG tablet TAKE 1 TABLET BY MOUTH EVERY DAY  . fluticasone (FLONASE) 50 MCG/ACT nasal spray SPRAY 2 SPRAYS INTO EACH NOSTRIL EVERY DAY  . furosemide (LASIX) 40 MG tablet TAKE 1 TABLET BY MOUTH EVERY DAY  . gabapentin (NEURONTIN) 300 MG capsule TAKE 1 CAPSULE BY MOUTH TWICE A DAY  . gentamicin cream (  GARAMYCIN) 0.1 % Apply 1 application topically 3 (three) times daily as needed (dry skin).   . hydrALAZINE (APRESOLINE) 50 MG tablet TAKE 1 TABLET BY MOUTH THREE TIMES A DAY  . insulin aspart (NOVOLOG) 100 UNIT/ML FlexPen Inject 0-6 Units into the skin 3 (three) times daily with meals. New sliding Scale If BS is 151 - 251 = 2 units,  If BS is 251 -  350 = 4 units,  If BS is 351 - 450 = 6 units Call provider if BS runs more than 400  . insulin glargine (LANTUS) 100 UNIT/ML injection Inject 8 Units into the skin at bedtime.   . Insulin Pen Needle (B-D UF III MINI PEN NEEDLES) 31G X 5 MM MISC Use twice daily with giving insulin injections. Dx E11.9  . ipratropium-albuterol (DUONEB) 0.5-2.5 (3) MG/3ML SOLN Take 3 mLs by nebulization every 6 (six) hours as needed (sob). Use 3 times daily x 4 days then every 6 hours as needed.  . isosorbide mononitrate (IMDUR) 30 MG 24 hr tablet Take 2 tablets (60 mg total) by mouth daily. Please make annual appt with Dr. Meda Coffee for future refills. Thank you. 1st attempt.  . latanoprost (XALATAN)  0.005 % ophthalmic solution Place 1 drop into both eyes at bedtime.  . levalbuterol (XOPENEX HFA) 45 MCG/ACT inhaler Inhale 2 puffs into the lungs every 6 (six) hours as needed for wheezing or shortness of breath.  . levothyroxine (SYNTHROID, LEVOTHROID) 125 MCG tablet Take 1 tablet (125 mcg total) by mouth daily.  [name] loratadine (CLARITIN) 10 MG tablet Take 10 mg by mouth daily as needed for allergies.  . mometasone-formoterol (DULERA) 200-5 MCG/ACT AERO TAKE TWO PUFFS BY MOUTH TWICE DAILY  . Multiple Vitamin (MULTIVITAMIN WITH MINERALS) TABS tablet Take 1 tablet by mouth daily.  [name] nystatin (MYCOSTATIN) 100000 UNIT/ML suspension Take 5 mLs (500,000 Units total) by mouth 4 (four) times daily.  [name] nystatin (MYCOSTATIN/NYSTOP) powder Apply 1 g topically as needed (skin irritation).   [name] oxyCODONE (ROXICODONE) 5 MG immediate release tablet Take 1 tablet (5 mg total) by mouth every 4 (four) hours as needed for severe pain. May take 1/2 tab q4hrs as needed for mild pain.  . OXYGEN Inhale 2 L into the lungs at bedtime.  . pantoprazole (PROTONIX) 40 MG tablet TAKE 1 TABLET BY MOUTH TWICE A DAY  . sertraline (ZOLOFT) 100 MG tablet TAKE 1 TABLET BY MOUTH EVERY DAY FOR DEPRESSION  . warfarin (COUMADIN) 2.5 MG tablet TAKE AS DIRECTED BY COUMADIN CLINIC.  [name] Water For Irrigation, Sterile (FREE WATER) SOLN Place 150 mLs into feeding tube 3 (three) times daily.   Dema Severin Petrolatum-Mineral Oil (SYSTANE NIGHTTIME) OINT Place 1 application into both eyes at bedtime.   . [DISCONTINUED] azithromycin (ZITHROMAX) 250 MG tablet 2 tablet today and 1 tablet daily until complete   No facility-administered encounter medications on file as of 12/25/2018.     Review of Systems  Constitutional: Negative for appetite change, chills, fatigue, fever and unexpected weight change.  HENT: Positive for hearing loss. Negative for congestion, ear pain, postnasal drip, rhinorrhea, sinus pressure, sinus pain, sneezing and sore throat.         Dysphagia.  Eyes: Positive for visual disturbance. Negative for pain, discharge, redness and itching.       Wears eye glasses   Respiratory: Negative for cough and chest tightness.        Chronic shortness of breath with exertion and wheezing stable   Cardiovascular: Negative for chest  pain, palpitations and leg swelling.  Gastrointestinal: Negative for abdominal distention, abdominal pain, constipation, diarrhea, nausea and vomiting.  Endocrine: Negative for cold intolerance, heat intolerance, polydipsia, polyphagia and polyuria.  Genitourinary: Negative for dysuria, flank pain, frequency and urgency.  Musculoskeletal: Positive for gait problem.       Right foot pain under control with current pain medication.   Skin: Negative for color change, pallor and rash.       Right foot incision dressing covered in plastic bag " to protect from getting wet from the rains". POA states no signs of infection.   Neurological: Negative for dizziness, light-headedness and headaches.       Hx peripheral neuropathy.   Hematological: Does not bruise/bleed easily.  Psychiatric/Behavioral: Negative for agitation, confusion and sleep disturbance. The patient is not nervous/anxious.     Immunization History  Administered Date(s) Administered  . Fluad Quad(high Dose 65+) 12/02/2018  . Influenza, High Dose Seasonal PF 01/19/2017  . Influenza,inj,Quad PF,6+ Mos 12/24/2013, 01/27/2015, 12/29/2015, 12/04/2017  . Influenza-Unspecified 12/02/2012  . Pneumococcal Conjugate-13 02/03/2014  . Pneumococcal Polysaccharide-23 08/31/2016  . Tdap 07/18/2016  . Zoster 04/12/2011   Pertinent  Health Maintenance Due  Topic Date Due  . FOOT EXAM  08/22/2017  . HEMOGLOBIN A1C  04/17/2019  . OPHTHALMOLOGY EXAM  12/16/2019  . INFLUENZA VACCINE  Completed  . DEXA SCAN  Completed  . PNA vac Low Risk Adult  Completed   Fall Risk  10/10/2018 08/27/2018 08/07/2018 05/14/2018 04/17/2018  Falls in the past year? 0 0 0 0 0   Number falls in past yr: 0 0 - 0 -  Injury with Fall? 0 0 - 0 -  Risk for fall due to : - - - - -    Vitals:   12/25/18 1129  BP: (!) 142/64  Pulse: 74  Temp: 98.4 F (36.9 C)  TempSrc: Temporal  SpO2: 98%  Weight: 143 lb 6.4 oz (65 kg)  Height: '5\' 6"'  (1.676 m)   Body mass index is 23.15 kg/m. Physical Exam Vitals signs reviewed.  Constitutional:      General: She is not in acute distress. HENT:     Head: Normocephalic.     Comments: Missing all teeth     Right Ear: Tympanic membrane, ear canal and external ear normal.     Left Ear: Tympanic membrane, ear canal and external ear normal. There is no impacted cerumen.     Ears:     Comments: Bilateral ear Hearing aids in place     Nose: Nose normal. No congestion or rhinorrhea.     Mouth/Throat:     Mouth: Mucous membranes are moist.     Pharynx: Oropharynx is clear. No oropharyngeal exudate or posterior oropharyngeal erythema.  Eyes:     General: No scleral icterus.       Right eye: No discharge.        Left eye: No discharge.     Extraocular Movements: Extraocular movements intact.     Conjunctiva/sclera: Conjunctivae normal.     Pupils: Pupils are equal, round, and reactive to light.     Comments: Corrective lens in place   Neck:     Musculoskeletal: Normal range of motion. No neck rigidity or muscular tenderness.     Vascular: No carotid bruit.  Cardiovascular:     Rate and Rhythm: Normal rate and regular rhythm.     Heart sounds: Murmur present. No friction rub. No gallop.   Pulmonary:  Effort: Pulmonary effort is normal. No respiratory distress.     Breath sounds: Normal breath sounds. No wheezing, rhonchi or rales.  Chest:     Chest wall: No tenderness.  Abdominal:     General: Bowel sounds are normal. There is no distension.     Palpations: Abdomen is soft. There is no mass.     Tenderness: There is no abdominal tenderness. There is no right CVA tenderness, left CVA tenderness, guarding or rebound.      Comments: Left Quadrant PEG tube dressing dry and clean.   Musculoskeletal:     Comments: Unsteady gait ambulates with Rolator.bilateral lower extremities trace edema. Status post right foot great toe amputation dressing dry and intact.   Lymphadenopathy:     Cervical: No cervical adenopathy.  Skin:    General: Skin is warm and dry.     Coloration: Skin is not jaundiced or pale.     Findings: No erythema, lesion or rash.     Comments: Right foot incision not not visualized.drsg intact per POA no signs of infections.Has upcoming appoint with vascular surgeon for follow up.Boot in place    Neurological:     Mental Status: She is alert and oriented to person, place, and time.     Cranial Nerves: No cranial nerve deficit.     Coordination: Coordination normal.     Gait: Gait abnormal.  Psychiatric:        Mood and Affect: Mood normal.        Behavior: Behavior normal.        Thought Content: Thought content normal.        Judgment: Judgment normal.    Labs reviewed: Recent Labs    11/24/18 2036 11/25/18 0530 12/02/18 1555  NA 136 140 137  K 4.4 4.2 4.3  CL 99 105 100  CO2 '26 26 30  ' GLUCOSE 86 106* 118  BUN 73* 70* 77*  CREATININE 1.56* 1.53* 1.53*  CALCIUM 9.6 9.7 9.9   Recent Labs    05/03/18 1055  08/27/18 1100 10/15/18 1122 11/24/18 2036  AST 42*   < > 22 23 32  ALT 34   < > '14 14 20  ' ALKPHOS 59  --   --   --  71  BILITOT 1.0   < > 0.3 0.3 0.5  PROT 7.6   < > 7.7 7.9 7.6  ALBUMIN 3.1*  --   --   --  3.8   < > = values in this interval not displayed.   Recent Labs    10/15/18 1122 11/24/18 2036 11/25/18 0530 12/02/18 1555  WBC 4.7 6.5 4.7 5.7  NEUTROABS 2,797 4.2  --  3,631  HGB 10.3* 9.6* 9.0* 9.6*  HCT 31.5* 29.7* 27.5* 29.2*  MCV 93.8 97.4 96.5 94.8  PLT 209 201 190 225   Lab Results  Component Value Date   TSH 0.98 10/15/2018   Lab Results  Component Value Date   HGBA1C 6.3 (H) 10/15/2018   Lab Results  Component Value Date   CHOL 131  10/15/2018   HDL 47 (L) 10/15/2018   LDLCALC 58 10/15/2018   TRIG 188 (H) 10/15/2018   CHOLHDL 2.8 10/15/2018    Significant Diagnostic Results in last 30 days:  No results found.  Assessment/Plan 1. Diabetes mellitus type 2 in nonobese [facility]) Lab Results  Component Value Date   HGBA1C 6.6 (H) 04/17/2018   Per pt CBG readings have improved post right great toe amputation.continue on Lantus  10 units daily at bedtime and Novolog three times with meals.on anticoagulant and Statin.  2. PAD (peripheral artery disease) (HCC) Status post right great toe amputation.trace edema noted.Continue on furosemide 40 mg tablet daily.continue to follow up with vascular specialist.   3. Chronic kidney insufficiency, stage 3 (moderate) (HCC) CR 2.18 on hospital admission but improved to 1.5 with hydration.continue renal dosing of all medication and avoid NSAID.  - CMP with eGFR([facility])  4. COPD with asthma ([place]) Breathing stable on room air.diminshed bilateral wheezes noted on exam.continue on Duoneb 3 ml every 6 hours as needed,Levalbuterol 2 puffs every 6 hours as needed,Dulera 2 puffs twice daily.       5. Chronic diastolic CHF (congestive heart failure) (HCC) No abrupt weight gain,cough or resting shortness of breath.Last ECHO 05/2016 showed EF 60-65%.continue on hydralazine 50 mg tablet three times daily,coreg 6.25 mg tablet twice daily,Furosemide 40 mg tablet daily and Imdur 60 mg tablet daily  6. Anemia of chronic disease Hgb 7.9 post right great toe amputation.continue on ferrous sulfate 325 mg tablet daily.  - CBC with Differential/Platelet  7. Amputation of right great toe (HCC) Afebrile.no signs or symptoms of infections.continue with Physical Therapy.followup with vascular Surgery as directed.Notify provider's office if  running any fever,chills or drainage from wound site  - CBC with Differential/Platelet  Family/ staff Communication: Reviewed plan of care with patient and POA   Labs/tests ordered:  - CBC with Differential/Platelet - CMP with eGFR([facility])  Nelda Bucks Kyren Vaux, NP  Labs reviewed: Recent Labs    11/24/18 2036 11/25/18 0530 12/02/18 1555  NA 136 140 137  K 4.4 4.2 4.3  CL 99 105 100  CO2 '26 26 30  ' GLUCOSE 86 106* 118  BUN 73* 70* 77*  CREATININE 1.56* 1.53* 1.53*  CALCIUM 9.6 9.7 9.9   Recent Labs    05/03/18 1055  08/27/18 1100 10/15/18 1122 11/24/18 2036  AST 42*   < > 22 23 32  ALT 34   < > '14 14 20  ' ALKPHOS 59  --   --   --  71  BILITOT 1.0   < > 0.3 0.3 0.5  PROT 7.6   < > 7.7 7.9 7.6  ALBUMIN 3.1*  --   --   --  3.8   < > = values in this interval not displayed.   Recent Labs    10/15/18 1122 11/24/18 2036 11/25/18 0530 12/02/18 1555  WBC 4.7 6.5 4.7 5.7  NEUTROABS 2,797 4.2  --  3,631  HGB 10.3* 9.6* 9.0* 9.6*  HCT 31.5* 29.7* 27.5* 29.2*  MCV 93.8 97.4 96.5 94.8  PLT 209 201 190 225   Lab Results  Component Value Date   TSH 0.98 10/15/2018   Lab Results  Component Value Date   HGBA1C 6.3 (H) 10/15/2018   Lab Results  Component Value Date   CHOL 131 10/15/2018   HDL 47 (L) 10/15/2018   LDLCALC 58 10/15/2018   TRIG 188 (H) 10/15/2018   CHOLHDL 2.8 10/15/2018    Significant Diagnostic Results in last 30 days:  No results found.  Assessment/Plan 1. Hematemesis with nausea Had blood tinge Emesis prior to visit and spat more during visit.Send to ED for evaluation rule out possible GI bleed.   2. Guaiac positive stools Rectal exam external and internal without any hemorrhoids noted but soreness reported.stool Guaiac positive.Given her nausea,vomiting epigastric pain will send to ED for evaluation to rule out GI bleed.   3. Gastroesophageal reflux disease,  esophagitis presence not specified Reports worsening epigastric pain and N/V.on Protonix 40 mg Tablet daily.   4. Nausea and vomiting, intractability of vomiting not specified, unspecified vomiting type Send to ED for further evaluation.   5.  Generalized weakness Afebrile.Worsening weakness per patient's daughter.Send to ED.   Family/ staff Communication: Reviewed plan of care with patient and patient's daughter who is her care giver to send patient to ED for evaluation due to hematemesis,Nausea ,vomiting and positive Guaicic stool.Patient and daughter both in agreement.Patient left with daughter via private car.   Labs/tests ordered: None   Sabryna Lahm C Catherine Oak, NP

## 2018-12-25 NOTE — Progress Notes (Signed)
ANTICOAGULATION CONSULT NOTE - Initial Consult  Pharmacy Consult for warfarin Indication: atrial fibrillation  Allergies  Allergen Reactions  . Penicillins Itching, Rash and Other (See Comments)    Tolerated amoxicillin, unasyn, zosyn & cephalosporins in the past. Did it involve swelling of the face/tongue/throat, SOB, or low BP? No Did it involve sudden or severe rash/hives, skin peeling, or any reaction on the inside of your mouth or nose? No Did you need to seek medical attention at a hospital or doctor's office? Unknown When did it last happen?5+ years If all above answers are "NO", may proceed with cephalosporin use.     Patient Measurements:  Vital Signs: Temp: 98.2 F (36.8 C) (09/23 1312) Temp Source: Oral (09/23 1312) BP: 182/68 (09/23 1900) Pulse Rate: 66 (09/23 1900)  Labs: Recent Labs    12/25/18 1518  HGB 10.1*  HCT 30.9*  PLT 264  LABPROT 27.9*  INR 2.7*  CREATININE 1.48*    Estimated Creatinine Clearance: 22.2 mL/min (A) (by C-G formula based on SCr of 1.48 mg/dL (H)).   Medical History: Past Medical History:  Diagnosis Date  . Acute respiratory failure ([place])   . Anemia    previous blood transfusions  . Arthritis    "all over"  . Asthma   . Atrial flutter ([place])   . Bradycardia    requiring previous d/c of BB and reduction of amiodarone  . CAD (coronary artery disease)    nonobstructive per notes  . Chronic diastolic CHF (congestive heart failure) ([place])   . CKD (chronic kidney disease), stage III ([place])   . Complication of blood transfusion    "got the wrong blood type at [facility] in ~ 2015; no adverse reaction that we are aware of"/daughter, [name] (01/27/2016)  . COPD (chronic obstructive pulmonary disease) ([place])   . Depression    "light case"  . DVT (deep venous thrombosis) ([facility]) 01/2016   a. LLE DVT 01/2016 - switched from Eliquis to Coumadin.  [name] Dyspnea    with some activity  . Gastric stenosis    a. s/p stomach tube  . GERD  (gastroesophageal reflux disease)   . History of blood transfusion    "several" (01/27/2016)  . History of stomach ulcers   . Hyperlipidemia   . Hypertension   . Hypothyroidism   . On home oxygen therapy    "2L; 9PM til 9AM" (06/27/2017)  . PAD (peripheral artery disease) ([place])    a. prior gangrene, toe amputations, intervention.  [name] PAF (paroxysmal atrial fibrillation) ([place])   . Paraesophageal hernia   . Perforated gastric ulcer ([place])   . Pneumonia    "a few times" (06/27/2017)  . Seasonal allergies   . SIADH (syndrome of inappropriate ADH production) ([place])    [name] 01/10/2015  . Small bowel obstruction ([place])    "I don't know how many" (01/11/2015)  . Stroke [facility])    several- no residual  . Type II diabetes mellitus ([place])    "related to prednisone use  for > 20 yr; once predinose stopped; no more DM RX" (01/27/2016)  . UTI (urinary tract infection) 02/08/2016  . Ventral hernia with bowel obstruction     Assessment: 85 yo F on warfarin PTA for Afib and hx DVT.  Pharmacy consulted to dose warfarin.   PTA warf dose: 5 mg daily x 7.5 mg on Sunday, last dose 9/22 at 7:30 PM.  Admit INR is therapeutic at 2.7.  Hg 10.1, PLTC 264. Pt is coumadin clinic pt: last  INR 11/29/18 INR = 2.2. continue 5 mg daily x 7.5 mg on Sundays.  Pt taking 2.5 mg tablets.   Pt admitted with rectal bleeding in the setting of constipation. Heme + stools in ED. Hg stable at 10.1.  CT abd performed with findings of diverticulosis and periumbilical hernias to the R and L of midline containing large and small bowels.  Per [facility] note cards rec to continue warfarin.  CHDS-VASc of 9 with HAS-BLED of 4  Goal of Therapy:  INR 2-3 Monitor platelets by anticoagulation protocol: Yes   Plan:  Continue with home regimen warfarin 7.5 mg Sunday and 5 mg all other days Daily INR  F/u CBC and BRBPR. Likely hemorrhoidal bleed per [facility] note  Eudelia Bunch, Pharm.D [phone] 12/25/2018 7:24 PM

## 2018-12-25 NOTE — ED Notes (Signed)
Pt. Documented in error see above note in chart. 

## 2018-12-26 ENCOUNTER — Encounter (HOSPITAL_COMMUNITY): Payer: Self-pay | Admitting: *Deleted

## 2018-12-26 DIAGNOSIS — I4891 Unspecified atrial fibrillation: Secondary | ICD-10-CM | POA: Diagnosis not present

## 2018-12-26 DIAGNOSIS — E119 Type 2 diabetes mellitus without complications: Secondary | ICD-10-CM | POA: Diagnosis not present

## 2018-12-26 DIAGNOSIS — I4892 Unspecified atrial flutter: Secondary | ICD-10-CM | POA: Diagnosis not present

## 2018-12-26 DIAGNOSIS — R1013 Epigastric pain: Secondary | ICD-10-CM | POA: Diagnosis not present

## 2018-12-26 DIAGNOSIS — Z7901 Long term (current) use of anticoagulants: Secondary | ICD-10-CM | POA: Diagnosis not present

## 2018-12-26 LAB — CBC
HCT: 30 % — ABNORMAL LOW (ref 36.0–46.0)
Hemoglobin: 9.8 g/dL — ABNORMAL LOW (ref 12.0–15.0)
MCH: 31.3 pg (ref 26.0–34.0)
MCHC: 32.7 g/dL (ref 30.0–36.0)
MCV: 95.8 fL (ref 80.0–100.0)
Platelets: 225 10*3/uL (ref 150–400)
RBC: 3.13 MIL/uL — ABNORMAL LOW (ref 3.87–5.11)
RDW: 15.4 % (ref 11.5–15.5)
WBC: 5.4 10*3/uL (ref 4.0–10.5)
nRBC: 0 % (ref 0.0–0.2)

## 2018-12-26 LAB — COMPREHENSIVE METABOLIC PANEL
ALT: 19 U/L (ref 0–44)
AST: 24 U/L (ref 15–41)
Albumin: 3.5 g/dL (ref 3.5–5.0)
Alkaline Phosphatase: 84 U/L (ref 38–126)
Anion gap: 9 (ref 5–15)
BUN: 52 mg/dL — ABNORMAL HIGH (ref 8–23)
CO2: 23 mmol/L (ref 22–32)
Calcium: 9.4 mg/dL (ref 8.9–10.3)
Chloride: 106 mmol/L (ref 98–111)
Creatinine, Ser: 1.28 mg/dL — ABNORMAL HIGH (ref 0.44–1.00)
GFR calc Af Amer: 42 mL/min — ABNORMAL LOW (ref >60)
GFR calc non Af Amer: 36 mL/min — ABNORMAL LOW (ref >60)
Glucose, Bld: 83 mg/dL (ref 70–99)
Potassium: 4 mmol/L (ref 3.5–5.1)
Sodium: 138 mmol/L (ref 135–145)
Total Bilirubin: 0.6 mg/dL (ref 0.3–1.2)
Total Protein: 7.4 g/dL (ref 6.5–8.1)

## 2018-12-26 LAB — GLUCOSE, CAPILLARY
Glucose-Capillary: 77 mg/dL (ref 70–99)
Glucose-Capillary: 79 mg/dL (ref 70–99)
Glucose-Capillary: 99 mg/dL (ref 70–99)

## 2018-12-26 LAB — PROTIME-INR
INR: 2.3 — ABNORMAL HIGH (ref 0.8–1.2)
Prothrombin Time: 25.3 seconds — ABNORMAL HIGH (ref 11.4–15.2)

## 2018-12-26 MED ORDER — HYDRALAZINE HCL 20 MG/ML IJ SOLN: 5.0000 mg | INTRAMUSCULAR | Status: DC | PRN

## 2018-12-26 MED ORDER — CHLORHEXIDINE GLUCONATE 0.12 % MT SOLN
15.0000 mL | Freq: Two times a day (BID) | OROMUCOSAL | Status: DC
  Administered 2018-12-26: 15 mL via OROMUCOSAL
  Filled 2018-12-26: qty 15

## 2018-12-26 MED ORDER — LEVOTHYROXINE SODIUM 25 MCG PO TABS
125.0000 ug | ORAL_TABLET | Freq: Every day | ORAL | Status: DC
  Administered 2018-12-26: 05:00:00 125 ug via ORAL
  Filled 2018-12-26: qty 1

## 2018-12-26 MED ORDER — ENSURE ENLIVE PO LIQD: 237.0000 mL | Freq: Two times a day (BID) | ORAL | Status: DC

## 2018-12-26 MED ORDER — OSMOLITE 1.2 CAL PO LIQD
1000.0000 mL | ORAL | Status: DC
  Filled 2018-12-26: qty 1000

## 2018-12-26 MED ORDER — ORAL CARE MOUTH RINSE: 15.0000 mL | Freq: Two times a day (BID) | OROMUCOSAL | Status: DC

## 2018-12-26 MED ORDER — BOOST / RESOURCE BREEZE PO LIQD CUSTOM
1.0000 | Freq: Three times a day (TID) | ORAL | Status: DC
  Administered 2018-12-26: 1 via ORAL

## 2018-12-26 MED ORDER — CYCLOBENZAPRINE HCL 5 MG PO TABS: 5.0000 mg | ORAL_TABLET | Freq: Three times a day (TID) | ORAL | 0 refills | Status: DC | PRN

## 2018-12-26 MED ORDER — JEVITY 1.2 CAL PO LIQD
1000.0000 mL | ORAL | Status: DC
  Filled 2018-12-26: qty 1000

## 2018-12-26 NOTE — Progress Notes (Signed)
Pt to be discharged to home this afternoon. Pt and Pt's Daughter [name] given discharge teaching including medications and schedules. Understanding verbalized by Pt's daughter of all discharge teaching. VSS No acute changes noted with Pt's assessment at time of discharge. Discharge packet with Pt and Pt's daughter at time of discharge

## 2018-12-26 NOTE — Discharge Summary (Signed)
Physician Discharge Summary  EVEREST LOO HKV:[id number] DOB: [birth year] DOA: 12/25/2018  PCP: Sharon Seller, NP  Admit date: 12/25/2018 Discharge date: 12/26/2018  Admitted From: Home Disposition:  Home  Recommendations for Outpatient Follow-up:  1. Follow up with PCP in 1-2 weeks 2. Follow up with coumadin clinic as scheduled 3. Follow up with Cardiology as scheduled  Home Health:PT   Discharge Condition:Stable CODE STATUS:Partial Diet recommendation: Diabetic, heart healthy   Brief/Interim Summary:  85 y.o. female with medical history significant of DM, afib on chronic coumadin, DVT, CAD, PVD presents to ED with several day hx of constipation and bright red blood per rectum. On further questioning, reports bright red blood when wiping primarily. Denies syncope or near syncope. Reports some epigastric discomfort improved sitting up, in hx of known GERD   ED Course: In ED, pt noted to have heme pos stools. Hgb noted to be 10.1, and was 9.6 on 8/20. CT abd performed with findings of diverticulosis and periumbilical hernias to the R and L of midline containing large and small bowels. Given concerns of blood per rectum, hospitalist consulted for consideration for admission.  Discharge Diagnoses:  Principal Problem:   Atrial fibrillation and flutter (HCC) Active Problems:   Essential hypertension, benign   Diabetes mellitus type 2 in nonobese (HCC)   Persistent atrial fibrillation   Hypothyroidism   Constipation   Hyperlipidemia   PVD (peripheral vascular disease) (HCC)   Warfarin anticoagulation   PEG (percutaneous endoscopic gastrostomy) status (HCC)   Peripheral vascular disease (HCC)   GI bleed  1. Afib 1. Remained rate controlled 2. Not currently on meds for rate control 3. Discussed case with Cardiologist on-call, see below. Recommendation to continue coumadin if no significant change in hgb 4. CHADS-VASc of 9 with HAS-BLED of 4 5. Hgb had remained stable  overnight 2. Bright red blood per rectum 1. Presents with blood per rectum in setting of constipation, likely hemorrhoidal bleed 2. Hgb remains stable at 10.1 at time of presentation, essentially unchanged overnight 3. Continue coumadin per above 3. HTN 1. Cont home meds 4. Chronic anticoagulation 1. Hgb remained stable 2. Discussed with Cardiology. Recommendation to continue coumadin as risk to anticoagulation outweighs benefit to holding 5. DM2 1. Remained stable 2. On SSI while in hospital 6. Hypothyroid 1. Would resume thyroid replacement as tolerated 2. stable 7. HLD 1. Would resume home meds as tolerated 8. PVD 1. S/p vascular surgery 2. Seems stable 9. Hx PEG placement 1. Presently stable   Discharge Instructions   Allergies as of 12/26/2018      Reactions   Penicillins Itching, Rash, Other (See Comments)   Tolerated amoxicillin, unasyn, zosyn & cephalosporins in the past. Did it involve swelling of the face/tongue/throat, SOB, or low BP? No Did it involve sudden or severe rash/hives, skin peeling, or any reaction on the inside of your mouth or nose? No Did you need to seek medical attention at a hospital or doctor's office? Unknown When did it last happen?5+ years If all above answers are "NO", may proceed with cephalosporin use.      Medication List    TAKE these medications   acetaminophen 500 MG tablet Commonly known as: TYLENOL Take 500 mg by mouth every 6 (six) hours as needed (pain).   amLODipine 5 MG tablet Commonly known as: NORVASC Take 1 tablet (5 mg total) by mouth daily.   ARTIFICIAL TEARS OP Place 1 drop into both eyes 4 (four) times daily.   atorvastatin 10  MG tablet Commonly known as: LIPITOR Take 1 tablet (10 mg total) by mouth daily.   b complex vitamins tablet Take 1 tablet by mouth daily.   calcium-vitamin D 500-200 MG-UNIT tablet Commonly known as: OSCAL WITH D Take 1 tablet by mouth daily with breakfast.   collagenase  ointment Commonly known as: SANTYL Apply 1 application topically daily as needed (wound care).   cycloSPORINE 0.05 % ophthalmic emulsion Commonly known as: Restasis USE 1 DROP INTO BOTH EYES TWICE DAILY What changed:   how much to take  how to take this  when to take this  additional instructions   Dulera 200-5 MCG/ACT Aero Generic drug: mometasone-formoterol TAKE TWO PUFFS BY MOUTH TWICE DAILY What changed:   how much to take  how to take this  when to take this  additional instructions   feeding supplement (PRO-STAT SUGAR FREE 64) Liqd Take 30 mLs by mouth 2 (two) times daily.   feeding supplement Liqd Take 711 mLs by mouth See admin instructions. For 12 hours 2100 - 0900   ferrous sulfate 325 (65 FE) MG tablet TAKE 1 TABLET BY MOUTH EVERY DAY   fluticasone 50 MCG/ACT nasal spray Commonly known as: FLONASE SPRAY 2 SPRAYS INTO EACH NOSTRIL EVERY DAY What changed:   how much to take  how to take this  when to take this  additional instructions   free water Soln Place 150 mLs into feeding tube 3 (three) times daily.   furosemide 40 MG tablet Commonly known as: LASIX TAKE 1 TABLET BY MOUTH EVERY DAY   gabapentin 300 MG capsule Commonly known as: NEURONTIN TAKE 1 CAPSULE BY MOUTH TWICE A DAY   gentamicin cream 0.1 % Commonly known as: GARAMYCIN Apply 1 application topically 3 (three) times daily as needed (dry skin).   hydrALAZINE 50 MG tablet Commonly known as: APRESOLINE TAKE 1 TABLET BY MOUTH THREE TIMES A DAY   insulin aspart 100 UNIT/ML FlexPen Commonly known as: NOVOLOG Inject 0-6 Units into the skin 3 (three) times daily with meals. New sliding Scale If BS is 151 - 251 = 2 units,  If BS is 251 -  350 = 4 units,  If BS is 351 - 450 = 6 units Call provider if BS runs more than 400   Insulin Pen Needle 31G X 5 MM Misc Commonly known as: B-D UF III MINI PEN NEEDLES Use twice daily with giving insulin injections. Dx E11.9    ipratropium-albuterol 0.5-2.5 (3) MG/3ML Soln Commonly known as: DUONEB Take 3 mLs by nebulization every 6 (six) hours as needed (sob). Use 3 times daily x 4 days then every 6 hours as needed. What changed: additional instructions   isosorbide mononitrate 30 MG 24 hr tablet Commonly known as: IMDUR Take 2 tablets (60 mg total) by mouth daily. Please make annual appt with Dr. Delton See for future refills. Thank you. 1st attempt. What changed: additional instructions   Lantus 100 UNIT/ML injection Generic drug: insulin glargine Inject 8 Units into the skin at bedtime.   latanoprost 0.005 % ophthalmic solution Commonly known as: XALATAN Place 1 drop into both eyes at bedtime.   levalbuterol 45 MCG/ACT inhaler Commonly known as: Xopenex HFA Inhale 2 puffs into the lungs every 6 (six) hours as needed for wheezing or shortness of breath.   levothyroxine 125 MCG tablet Commonly known as: SYNTHROID Take 1 tablet (125 mcg total) by mouth daily.   loratadine 10 MG tablet Commonly known as: CLARITIN Take 10 mg by mouth  daily as needed for allergies.   multivitamin with minerals Tabs tablet Take 1 tablet by mouth daily.   nystatin 100000 UNIT/ML suspension Commonly known as: MYCOSTATIN Take 5 mLs (500,000 Units total) by mouth 4 (four) times daily.   nystatin powder Commonly known as: MYCOSTATIN/NYSTOP Apply 1 g topically as needed (skin irritation).   oxyCODONE 5 MG immediate release tablet Commonly known as: Roxicodone Take 1 tablet (5 mg total) by mouth every 4 (four) hours as needed for severe pain. May take 1/2 tab q4hrs as needed for mild pain. What changed:   how much to take  additional instructions   OXYGEN Inhale 2 L into the lungs at bedtime.   pantoprazole 40 MG tablet Commonly known as: PROTONIX TAKE 1 TABLET BY MOUTH TWICE A DAY   sertraline 100 MG tablet Commonly known as: ZOLOFT TAKE 1 TABLET BY MOUTH EVERY DAY FOR DEPRESSION What changed:   how much  to take  how to take this  when to take this  additional instructions   Systane Nighttime Oint Place 1 application into both eyes at bedtime.   warfarin 2.5 MG tablet Commonly known as: COUMADIN Take as directed. If you are unsure how to take this medication, talk to your nurse or doctor. Original instructions: TAKE AS DIRECTED BY COUMADIN CLINIC. What changed: See the new instructions.      Follow-up Information    Care, [facility] Follow up.   Specialty: Home Health Services Why: [facility] physical therapy Contact information: [address] [place] [address] [phone]        Sharon Seller, NP. Schedule an appointment as soon as possible for a visit in 1 week(s).   Specialty: Geriatric Medicine Contact information: [address] [place] [address] [phone]        Lars Masson, MD .   Specialty: Cardiology Contact information: [address] [place] [address] [phone]          Allergies  Allergen Reactions  . Penicillins Itching, Rash and Other (See Comments)    Tolerated amoxicillin, unasyn, zosyn & cephalosporins in the past. Did it involve swelling of the face/tongue/throat, SOB, or low BP? No Did it involve sudden or severe rash/hives, skin peeling, or any reaction on the inside of your mouth or nose? No Did you need to seek medical attention at a hospital or doctor's office? Unknown When did it last happen?5+ years If all above answers are "NO", may proceed with cephalosporin use.     Consultations:  Discussed case with Cardiology over phone  Procedures/Studies: Ct Abdomen Pelvis W Contrast  Result Date: 12/25/2018 CLINICAL DATA:  Acute abdominal pain and rectal pain. Blood in stools for 1 week. EXAM: CT ABDOMEN AND PELVIS WITH CONTRAST TECHNIQUE: Multidetector CT imaging of the abdomen and pelvis was performed using the standard protocol following bolus administration of  intravenous contrast. CONTRAST:  80mL OMNIPAQUE IOHEXOL 300 MG/ML  SOLN COMPARISON:  CT scan of the abdomen dated 06/06/2017 and abdominal radiographs dated 12/25/2018 FINDINGS: Lower chest: No acute abnormality. Aortic atherosclerosis. Hepatobiliary: Multiple small hepatic cysts, unchanged. Cholecystectomy. No dilated bile ducts. Pancreas: Unremarkable. No pancreatic ductal dilatation or surrounding inflammatory changes. Spleen: Normal in size without focal abnormality. Adrenals/Urinary Tract: Adrenal glands and kidneys are normal except for a 12 mm cyst on the lower pole of the left kidney. No hydronephrosis. Bladder is normal. Stomach/Bowel: There are diverticula throughout the colon. Gastrostomy tube in place. The tip of  the tube is in the proximal jejunum in the left upper quadrant. No dilated bowel. Appendix is not visualized. Surgical staple lines noted in several areas of the bowel. Vascular/Lymphatic: Aortic atherosclerosis. No enlarged abdominal or pelvic lymph nodes. Reproductive: Uterus and bilateral adnexa are unremarkable. Other: There are periumbilical hernias to the right and left of midline. The hernia to the right contains large and small bowel loops without evidence of obstruction. The hernia to the left of midline contains only peritoneal fat. No significant change since the prior study. Musculoskeletal: No acute abnormalities. Degenerative disc and joint disease in the lower lumbar spine. Old mild compression fracture of T11. IMPRESSION: 1. No acute abnormalities of the abdomen or pelvis. 2. Diverticulosis of the colon. 3. Periumbilical hernias to the right and left of midline containing large and small bowel without evidence of obstruction. 4. Aortic atherosclerosis. Aortic Atherosclerosis (ICD10-I70.0). Electronically Signed   By: Francene Boyers M.D.   On: 12/25/2018 16:38   Dg Abdomen Acute W/chest  Result Date: 12/25/2018 CLINICAL DATA:  Constipation EXAM: DG ABDOMEN ACUTE W/ 1V CHEST  COMPARISON:  June 06, 2017 FINDINGS: There is elevation of the right hemidiaphragm. There is no pneumothorax. No large pleural effusion. No focal infiltrate. Aortic calcifications are noted. The bowel gas pattern is nonobstructive. A gastrojejunostomy is noted. The tube appears well position. Surgical staples are noted in the left lower quadrant. There are degenerative changes throughout the visualized portions of the thoracolumbar spine. IMPRESSION: 1. No evidence of acute cardiopulmonary process. 2. Nonobstructive bowel gas pattern. 3. Stable elevation of the right hemidiaphragm. 4. Gastrojejunostomy tube appears well position. Electronically Signed   By: Katherine Mantle M.D.   On: 12/25/2018 16:02     Subjective: Eager to go home  Discharge Exam: Vitals:   12/26/18 0515 12/26/18 1255  BP: (!) 178/78 126/74  Pulse: 63 68  Resp: 18 18  Temp: (!) 97.5 F (36.4 C) 98.4 F (36.9 C)  SpO2: 100% 94%   Vitals:   12/26/18 0119 12/26/18 0511 12/26/18 0515 12/26/18 1255  BP:  (!) 178/78 (!) 178/78 126/74  Pulse:   63 68  Resp:   18 18  Temp:   (!) 97.5 F (36.4 C) 98.4 F (36.9 C)  TempSrc:   Oral Oral  SpO2:   100% 94%  Weight: 62.5 kg     Height: 5\' 6"  (1.676 m)       General: Pt is alert, awake, not in acute distress Cardiovascular: RRR, S1/S2 +, no rubs, no gallops Respiratory: CTA bilaterally, no wheezing, no rhonchi Abdominal: Soft, NT, ND, bowel sounds + Extremities: no edema, no cyanosis   The results of significant diagnostics from this hospitalization (including imaging, microbiology, ancillary and laboratory) are listed below for reference.     Microbiology: Recent Results (from the past 240 hour(s))  SARS Coronavirus 2 [facility] order, Performed in [facility] hospital lab) Nasopharyngeal Nasopharyngeal Swab     Status: None   Collection Time: 12/25/18  6:04 PM   Specimen: Nasopharyngeal Swab  Result Value Ref Range Status   SARS Coronavirus 2 NEGATIVE NEGATIVE  Final    Comment: (NOTE) If result is NEGATIVE SARS-CoV-2 target nucleic acids are NOT DETECTED. The SARS-CoV-2 RNA is generally detectable in upper and lower  respiratory specimens during the acute phase of infection. The lowest  concentration of SARS-CoV-2 viral copies this assay can detect is 250  copies / mL. A negative result does not preclude SARS-CoV-2 infection  and should not be used  as the sole basis for treatment or other  patient management decisions.  A negative result may occur with  improper specimen collection / handling, submission of specimen other  than nasopharyngeal swab, presence of viral mutation(s) within the  areas targeted by this assay, and inadequate number of viral copies  (<250 copies / mL). A negative result must be combined with clinical  observations, patient history, and epidemiological information. If result is POSITIVE SARS-CoV-2 target nucleic acids are DETECTED. The SARS-CoV-2 RNA is generally detectable in upper and lower  respiratory specimens dur ing the acute phase of infection.  Positive  results are indicative of active infection with SARS-CoV-2.  Clinical  correlation with patient history and other diagnostic information is  necessary to determine patient infection status.  Positive results do  not rule out bacterial infection or co-infection with other viruses. If result is PRESUMPTIVE POSTIVE SARS-CoV-2 nucleic acids MAY BE PRESENT.   A presumptive positive result was obtained on the submitted specimen  and confirmed on repeat testing.  While 2019 novel coronavirus  (SARS-CoV-2) nucleic acids may be present in the submitted sample  additional confirmatory testing may be necessary for epidemiological  and / or clinical management purposes  to differentiate between  SARS-CoV-2 and other Sarbecovirus currently known to infect humans.  If clinically indicated additional testing with an alternate test  methodology [phone]) is advised. The  SARS-CoV-2 RNA is generally  detectable in upper and lower respiratory sp ecimens during the acute  phase of infection. The expected result is Negative. Fact Sheet for Patients:  BoilerBrush.com.cy Fact Sheet for Healthcare Providers: https://pope.com/ This test is not yet approved or cleared by the [place] FDA and has been authorized for detection and/or diagnosis of SARS-CoV-2 by FDA under an Emergency Use Authorization (EUA).  This EUA will remain in effect (meaning this test can be used) for the duration of the COVID-19 declaration under Section 564(b)(1) of the Act, 21 U.S.C. section 360bbb-3(b)(1), unless the authorization is terminated or revoked sooner. Performed at [facility], [address] [place], [place] [address]      Labs: BNP (last 3 results) Recent Labs    11/25/18 0530  BNP 233.9*   Basic Metabolic Panel: Recent Labs  Lab 12/25/18 1518 12/26/18 0533  NA 137 138  K 4.1 4.0  CL 101 106  CO2 27 23  GLUCOSE 87 83  BUN 61* 52*  CREATININE 1.48* 1.28*  CALCIUM 9.7 9.4   Liver Function Tests: Recent Labs  Lab 12/25/18 1518 12/26/18 0533  AST 27 24  ALT 20 19  ALKPHOS 87 84  BILITOT 0.5 0.6  PROT 7.9 7.4  ALBUMIN 3.8 3.5   No results for input(s): LIPASE, AMYLASE in the last 168 hours. No results for input(s): AMMONIA in the last 168 hours. CBC: Recent Labs  Lab 12/25/18 1518 12/26/18 0533  WBC 7.8 5.4  HGB 10.1* 9.8*  HCT 30.9* 30.0*  MCV 94.5 95.8  PLT 264 225   Cardiac Enzymes: No results for input(s): CKTOTAL, CKMB, CKMBINDEX, TROPONINI in the last 168 hours. BNP: Invalid input(s): POCBNP CBG: Recent Labs  Lab 12/25/18 2215 12/26/18 0110 12/26/18 0735 12/26/18 1130  GLUCAP 77 77 79 99   D-Dimer No results for input(s): DDIMER in the last 72 hours. Hgb A1c No results for input(s): HGBA1C in the last 72 hours. Lipid Profile No results for input(s):  CHOL, HDL, LDLCALC, TRIG, CHOLHDL, LDLDIRECT in the last 72 hours. Thyroid function studies No results for  input(s): TSH, T4TOTAL, T3FREE, THYROIDAB in the last 72 hours.  Invalid input(s): FREET3 Anemia work up No results for input(s): VITAMINB12, FOLATE, FERRITIN, TIBC, IRON, RETICCTPCT in the last 72 hours. Urinalysis    Component Value Date/Time   COLORURINE YELLOW 11/24/2018 1952   APPEARANCEUR HAZY (A) 11/24/2018 1952   LABSPEC 1.008 11/24/2018 1952   PHURINE 7.0 11/24/2018 1952   GLUCOSEU NEGATIVE 11/24/2018 1952   HGBUR NEGATIVE 11/24/2018 1952   BILIRUBINUR NEGATIVE 11/24/2018 1952   BILIRUBINUR Neg 11/01/2017 1228   KETONESUR NEGATIVE 11/24/2018 1952   PROTEINUR NEGATIVE 11/24/2018 1952   UROBILINOGEN 0.2 11/01/2017 1228   UROBILINOGEN 0.2 01/27/2015 2308   NITRITE NEGATIVE 11/24/2018 1952   LEUKOCYTESUR NEGATIVE 11/24/2018 1952   Sepsis Labs Invalid input(s): PROCALCITONIN,  WBC,  LACTICIDVEN Microbiology Recent Results (from the past 240 hour(s))  SARS Coronavirus 2 [facility] order, Performed in [facility] hospital lab) Nasopharyngeal Nasopharyngeal Swab     Status: None   Collection Time: 12/25/18  6:04 PM   Specimen: Nasopharyngeal Swab  Result Value Ref Range Status   SARS Coronavirus 2 NEGATIVE NEGATIVE Final    Comment: (NOTE) If result is NEGATIVE SARS-CoV-2 target nucleic acids are NOT DETECTED. The SARS-CoV-2 RNA is generally detectable in upper and lower  respiratory specimens during the acute phase of infection. The lowest  concentration of SARS-CoV-2 viral copies this assay can detect is 250  copies / mL. A negative result does not preclude SARS-CoV-2 infection  and should not be used as the sole basis for treatment or other  patient management decisions.  A negative result may occur with  improper specimen collection / handling, submission of specimen other  than nasopharyngeal swab, presence of viral mutation(s) within the  areas targeted by this  assay, and inadequate number of viral copies  (<250 copies / mL). A negative result must be combined with clinical  observations, patient history, and epidemiological information. If result is POSITIVE SARS-CoV-2 target nucleic acids are DETECTED. The SARS-CoV-2 RNA is generally detectable in upper and lower  respiratory specimens dur ing the acute phase of infection.  Positive  results are indicative of active infection with SARS-CoV-2.  Clinical  correlation with patient history and other diagnostic information is  necessary to determine patient infection status.  Positive results do  not rule out bacterial infection or co-infection with other viruses. If result is PRESUMPTIVE POSTIVE SARS-CoV-2 nucleic acids MAY BE PRESENT.   A presumptive positive result was obtained on the submitted specimen  and confirmed on repeat testing.  While 2019 novel coronavirus  (SARS-CoV-2) nucleic acids may be present in the submitted sample  additional confirmatory testing may be necessary for epidemiological  and / or clinical management purposes  to differentiate between  SARS-CoV-2 and other Sarbecovirus currently known to infect humans.  If clinically indicated additional testing with an alternate test  methodology [phone]) is advised. The SARS-CoV-2 RNA is generally  detectable in upper and lower respiratory sp ecimens during the acute  phase of infection. The expected result is Negative. Fact Sheet for Patients:  BoilerBrush.com.cy Fact Sheet for Healthcare Providers: https://pope.com/ This test is not yet approved or cleared by the [place] FDA and has been authorized for detection and/or diagnosis of SARS-CoV-2 by FDA under an Emergency Use Authorization (EUA).  This EUA will remain in effect (meaning this test can be used) for the duration of the COVID-19 declaration under Section 564(b)(1) of the Act, 21 U.S.C. section 360bbb-3(b)(1),  unless the authorization is terminated  or revoked sooner. Performed at [facility], [address], [place], [place] [address]    Time spent: 30 min  SIGNED:   Rickey Barbara, MD  [facility] 12/26/2018, 1:02 PM  If 7PM-7AM, please contact night-coverage

## 2018-12-26 NOTE — TOC Progression Note (Signed)
Transition of Care [facility]) - Progression Note    Patient Details  Name: Shannon [name] MRN: [id number] Date of Birth: 1924/03/15  Transition of Care [facility] CM/SW Contact  Shaelee Forni, Juliann Pulse, RN Phone Number: 12/26/2018, 10:40 AM  Clinical Narrative:  [place] [phone] [place] [phone] [place] [phone] [place] [phone] [place] [phone] [place] [phone] [place] [phone] [facility] [phone] [place] [phone] [facility] [phone] [place] [phone] 3 out of 5 stars 5 out of 5 stars 3 out of 5 stars 4 out of 5 stars 4 out of 5 stars 4 out of 5 stars 4  out of 5 stars 3 out of 5 stars 4 out of 5 stars 4 out of 5 stars 4 out of 5 stars 4 out of 5 stars 4 out of 5 stars 4 out of 5 stars 3  out of 5 stars 4 out of 5 stars 3 out of 5 stars 4 out of 5 stars 3  out of 5 stars 4 out of 5 stars 3  out of 5 stars 3 out of 5 stars 12/26/2018 Medicare Home health results [email or web address]&paging=114 2/2 [place] of Patient Care Rating Patient Survey Summary Rating [place] [phone] [place] [phone] [place] [phone] [place]          Expected Discharge Plan and Services           Expected Discharge Date: 12/27/18                                     Social Determinants of Health (SDOH) Interventions    Readmission Risk Interventions No flowsheet data found.

## 2018-12-26 NOTE — Progress Notes (Signed)
Initial Nutrition Assessment  RD working remotely.   DOCUMENTATION CODES:   Not applicable  INTERVENTION:  - will order TF via J-port: Osmolite 1.2 @ 65 ml/hr x15 hours (1800-0900) which will provide 1170 kcal (75% estimated kcal need), 54 grams protein (83% estimated protein need), and 799 ml free water. - continue Boost Breeze TID, each supplement provides 250 kcal and 9 grams of protein. - continue to encourage PO intakes, as tolerated.    NUTRITION DIAGNOSIS:   Inadequate protein intake related to other (see comment)(current diet order) as evidenced by other (comment)(CLD does not meet estimated protein need.).  GOAL:   Patient will meet greater than or equal to 90% of their needs  MONITOR:   PO intake, Supplement acceptance, TF tolerance, Labs, Weight trends  REASON FOR ASSESSMENT:   Malnutrition Screening Tool, Consult Enteral/tube feeding initiation and management  ASSESSMENT:   85 y.o. female with medical history significant of DM, afib on chronic coumadin, DVT, GERD, CAD, and PVD. She presented to the ED on 9/23 due to several days of constipation and BRBPR. On further questioning, reported bright red blood when wiping primarily. Denied syncope or near syncope. Reports some epigastric discomfort which improved when sitting up.  Diet advanced from NPO to CLD yesterday at 1843 and no intakes documented since that time. Patient has G-J tube. Tube was placed several years ago d/t patient having a history of pyloric stenosis. Patient does not know much about her TF regimen. Per review of notes, it appears that patient receives either Osmolite 1.0 or Osmolite 1.2 at 65 ml/hr over 12 hours/day (2100-0900) via J-port. Notes have indicated that G-tube port was placed for venting purposes.   Per chart review, current weight is 138 lb and weight on 8/25 was 140 lb. This indicates 2 lb weight loss (1.4% body weight) in the past 1 month; not significant for time frame.  Boost  Breeze was ordered TID earlier today and patient has not yet been provided with this supplement. Ensure Enlive was ordered BID at the same time, but is not acceptable for CLD. Will monitor for diet advancement and ability for patient to have Ensure, if she wishes.   Per notes: - BRBPR--constipation, suspected hemorrhoidal bleeding; Hgb stable - OBS status, not inpatient at this time   Labs reviewed; BUN: 52 mg/dl, creatinine: 1.28 mg/dl, GFR: 42 ml/min. Medications reviewed; 1 tablet oscal-D/day, 20 mg IV pepcid x1 dose 9/23, 325 mg ferrous sulfate/day, 40 mg oral lasix/day, sliding scale novolog, 8 units lantus/day, 125 mcg oral synthroid/day, daily multivitamin with minerals, 40 mg oral protonix BID.      NUTRITION - FOCUSED PHYSICAL EXAM:  unable to complete at this time.   Diet Order:   Diet Order            Diet clear liquid Room service appropriate? Yes; Fluid consistency: Thin  Diet effective now              EDUCATION NEEDS:   No education needs have been identified at this time  Skin:  Skin Assessment: Reviewed RN Assessment  Last BM:  9/24  Height:   Ht Readings from Last 1 Encounters:  12/26/18 5\' 6"  (1.676 m)    Weight:   Wt Readings from Last 1 Encounters:  12/26/18 62.5 kg    Ideal Body Weight:  59.1 kg  BMI:  Body mass index is 22.24 kg/m.  Estimated Nutritional Needs:   Kcal:  1550-1750 kcal  Protein:  65-75 grams  Fluid:  >/=  1.8 L/day      Jarome Matin, MS, RD, LDN, [facility] Inpatient Clinical Dietitian Pager # [phone] After hours/weekend pager # [phone]

## 2018-12-26 NOTE — Progress Notes (Addendum)
SLP Cancellation Note  Patient Details Name: Shannon [name] MRN: [id number] DOB: 18-Jul-1923   Cancelled treatment:       Reason Eval/Treat Not Completed: Patient politely declined participation in BSE. Pt reports tolerating PEG feeds at night, and takes sips of liquid during the day. Pt indicated today that she does not feel the need to repeat MBS.    Pt/dtr declined further speech therapy intervention when seen 05/2018.   MBS completed 05/15/2016 - pt exhibited silent aspiration of thin liquid. Long standing history of esophageal dysphagia.    [name] B. Quentin Ore [facility], CCC-SLP Speech Language Pathologist [phone]  [name] 12/26/2018, 3:14 PM

## 2018-12-26 NOTE — Plan of Care (Signed)
  Problem: Pain Managment: Goal: General experience of comfort will improve Outcome: Progressing   Problem: Safety: Goal: Ability to remain free from injury will improve Outcome: Progressing   Problem: Skin Integrity: Goal: Risk for impaired skin integrity will decrease Outcome: Progressing   

## 2018-12-26 NOTE — Evaluation (Signed)
Occupational Therapy Evaluation Patient Details Name: Shannon [name] MRN: [id number] DOB: 05-17-1923 Today's Date: 12/26/2018    History of Present Illness 85 y.o. female with medical history significant of DM, afib on chronic coumadin, DVT, CAD, PVD presents to ED with several day hx of constipation and bright red blood per rectum. Recent admission in August 2020 for evaluation of syncopal episode.   Clinical Impression   Pt admitted with the above diagnoses and presents with below problem list. Pt will benefit from continued acute OT to address the below listed deficits and maximize independence with basic ADLs prior to d/c to venue below. Per recent admission notes, at baseline pt was using a rollator for short distances, assist needed for tub transfers. Pt is currently mod A with LB ADLs and toilet transfers (stand-pivot to [facility]) and min A with UB ADLs. Pt reports she has family with her at home 24/7. As long as family is ok to provide her current level of assistance then recommend d/c home. Unable to confirm if pt currently has home health aide services. She would benefit from [facility] aide as well if that is not already onboard.      Follow Up Recommendations  Home health OT;Supervision/Assistance - 24 hour; HH aide   Equipment Recommendations  None recommended by OT    Recommendations for Other Services PT consult     Precautions / Restrictions Precautions Precautions: Fall Restrictions Weight Bearing Restrictions: No      Mobility Bed Mobility Overal bed mobility: Needs Assistance Bed Mobility: Supine to Sit;Sit to Supine     Supine to sit: Mod assist;HOB elevated Sit to supine: Mod assist   General bed mobility comments: Mod A to fully pivot hips using bed pad to facilitate pt's efforts. Pain in BUE a limiting factor. Pt also reaching for therapist arm to assist with powerup of trunk. Assist to advance BLE up and onto bed to return to supine.   Transfers Overall transfer  level: Needs assistance Equipment used: Rolling walker (2 wheeled) Transfers: Sit to/from [employer] Sit to Stand: Mod assist;From elevated surface Stand pivot transfers: Min assist;Mod assist;From elevated surface       General transfer comment: EOB<>BSC. Assist to powerup and steady. Some assist to advance rw as well. Pt uses a rollator at home.    Balance Overall balance assessment: Needs assistance Sitting-balance support: Bilateral upper extremity supported;Feet supported Sitting balance-Leahy Scale: Fair     Standing balance support: Bilateral upper extremity supported;During functional activity Standing balance-Leahy Scale: Poor Standing balance comment: rollator at baseline. external support for static standing                           ADL either performed or assessed with clinical judgement   ADL Overall ADL's : Needs assistance/impaired Eating/Feeding: Set up   Grooming: Minimal assistance;Sitting   Upper Body Bathing: Minimal assistance;Sitting   Lower Body Bathing: Moderate assistance   Upper Body Dressing : Minimal assistance;Sitting   Lower Body Dressing: Moderate assistance;Sit to/from stand   Toilet Transfer: Moderate assistance;Stand-pivot;Cueing for safety;BSC;RW   Toileting- Clothing Manipulation and Hygiene: Moderate assistance;Sit to/from stand Toileting - Clothing Manipulation Details (indicate cue type and reason): Mod A to stand, pt steadying self with BUE on rw. therapist completed pericare Tub/ Shower Transfer: Tub transfer;Moderate assistance;Stand-pivot;Shower seat;Rolling walker     General ADL Comments: Pt completed bed mobility, sat EOB a couple of minutes, then toilet transfer and pericare and back  to bed. Generalized weakness, impaired balance, decreased activity tolerance, and r/o pain noted     Vision Baseline Vision/History: Wears glasses Wears Glasses: At all times       Perception     Praxis       Pertinent Vitals/Pain Pain Assessment: Faces Faces Pain Scale: Hurts even more Pain Location: abdomen with bed mobility and transfers; "my arms" Pain Descriptors / Indicators: Grimacing;Guarding Pain Intervention(s): Monitored during session;Repositioned;Limited activity within patient's tolerance     Hand Dominance     Extremity/Trunk Assessment Upper Extremity Assessment Upper Extremity Assessment: Generalized weakness   Lower Extremity Assessment Lower Extremity Assessment: Defer to PT evaluation   Cervical / Trunk Assessment Cervical / Trunk Assessment: Kyphotic   Communication Communication Communication: HOH;Other (comment)(low volume)   Cognition Arousal/Alertness: Awake/alert Behavior During Therapy: Flat affect Overall Cognitive Status: No family/caregiver present to determine baseline cognitive functioning                                 General Comments: Able to state that she is at [employer]. Minimal verbalizations, low volume. Answers basic questions appropriately.   General Comments  Pt received on 2L supplemental O2 which was removed after transfer to [facility]. Pt on RA for remainder of session with O2 97 at end of session on RA. Pt left on RA, nursing notified.    Exercises     Shoulder Instructions      Home Living Family/patient expects to be discharged to:: Private residence Living Arrangements: Children Available Help at Discharge: Family;Available 24 hours/day Type of Home: House Home Access: Ramped entrance     Home Layout: One level     Bathroom Shower/Tub: Tub/shower unit;Curtain   Bathroom Toilet: Standard Bathroom Accessibility: Yes   Home Equipment: Environmental consultant - 4 wheels;Cane - single point;Hand held Careers information officer - 2 wheels;Bedside commode;Tub bench          Prior Functioning/Environment Level of Independence: Needs assistance  Gait / Transfers Assistance Needed: rollator for short ambulation distances  ADL's /  Homemaking Assistance Needed: daughter helps with tub transfer per OT note in February 2020.            OT Problem List: Decreased strength;Decreased activity tolerance;Impaired balance (sitting and/or standing);Decreased knowledge of use of DME or AE;Decreased knowledge of precautions;Cardiopulmonary status limiting activity;Pain      OT Treatment/Interventions: Self-care/ADL training;Therapeutic exercise;Energy conservation;DME and/or AE instruction;Therapeutic activities;Patient/family education;Balance training    OT Goals(Current goals can be found in the care plan section) Acute Rehab OT Goals Patient Stated Goal: not stated. OT Goal Formulation: With patient Time For Goal Achievement: 01/09/19 Potential to Achieve Goals: Good ADL Goals Pt Will Perform Grooming: with supervision;sitting Pt Will Perform Upper Body Bathing: with supervision;with set-up;sitting Pt Will Perform Lower Body Bathing: with min guard assist;sit to/from stand Pt Will Perform Upper Body Dressing: with set-up;with supervision;sitting Pt Will Perform Lower Body Dressing: with min guard assist;sit to/from stand Pt Will Transfer to Toilet: with min guard assist;stand pivot transfer;bedside commode Pt Will Perform Toileting - Clothing Manipulation and hygiene: with min guard assist;sit to/from stand;with min assist  OT Frequency: Min 2X/week   Barriers to D/C:            Co-evaluation              AM-PAC OT "6 Clicks" Daily Activity     Outcome Measure Help from another person eating meals?: None Help from another  person taking care of personal grooming?: A Little Help from another person toileting, which includes using toliet, bedpan, or urinal?: A Lot Help from another person bathing (including washing, rinsing, drying)?: A Lot Help from another person to put on and taking off regular upper body clothing?: A Little Help from another person to put on and taking off regular lower body clothing?: A  Lot 6 Click Score: 16   End of Session Equipment Utilized During Treatment: Gait belt;Rolling walker Nurse Communication: Mobility status;Other (comment)(left on RA, 02 97.)  Activity Tolerance: Patient limited by pain;Patient limited by fatigue Patient left: in bed;with call bell/phone within reach;with bed alarm set;with SCD's reapplied  OT Visit Diagnosis: Unsteadiness on feet (R26.81);Muscle weakness (generalized) (M62.81);History of falling (Z91.81);Other abnormalities of gait and mobility (R26.89);Pain                Time: 0910-0940 OT Time Calculation (min): 30 min Charges:  OT General Charges $OT Visit: 1 Visit OT Evaluation $OT Eval Low Complexity: 1 Low OT Treatments $Self Care/Home Management : 8-22 mins  Raynald Kemp, OT Acute Rehabilitation Services Pager: [phone] Office: [phone]   Pilar Grammes 12/26/2018, 10:00 AM

## 2018-12-26 NOTE — TOC Initial Note (Signed)
Transition of Care [facility]) - Initial/Assessment Note    Patient Details  Name: Shannon [name] MRN: [id number] Date of Birth: [birth year]  [facility] CM/SW Contact:    Lanier Clam, RN Phone Number: 12/26/2018, 11:25 AM  Clinical Narrative: Patient/dtr on phone agreed toHHPPT only-chose [facility] rep [facility] aware. Dtr will transport home on own. No further CM needs.                  Expected Discharge Plan: Home w Home Health Services Barriers to Discharge: No Barriers Identified   Patient Goals and CMS Choice Patient states their goals for this hospitalization and ongoing recovery are:: go home CMS Medicare.gov Compare Post Acute Care list provided to:: Patient Choice offered to / list presented to : Patient  Expected Discharge Plan and Services Expected Discharge Plan: Home w Home Health Services   Discharge Planning Services: CM Consult Post Acute Care Choice: Home Health Living arrangements for the past 2 months: Single Family Home Expected Discharge Date: 12/27/18                         HH Arranged: PT HH Agency: [facility] Date [facility] Agency Contacted: 12/26/18 Time HH Agency Contacted: [id number] Representative spoke with at [facility] Agency: Kandee Keen  Prior Living Arrangements/Services Living arrangements for the past 2 months: Single Family Home Lives with:: Adult Children Patient language and need for interpreter reviewed:: Yes Do you feel safe going back to the place where you live?: Yes      Need for Family Participation in Patient Care: Yes (Comment) Care giver support system in place?: Yes (comment) Current home services: DME(3n1,rw) Criminal Activity/Legal Involvement Pertinent to Current Situation/Hospitalization: No - Comment as needed  Activities of Daily Living Home Assistive Devices/Equipment: Oxygen, CBG Meter, Walker (specify type), Feeding equipment, Enteral Feeding Supplies, Eyeglasses, Hearing aid, Shower chair without back, Bedside  commode/3-in-1 ADL Screening (condition at time of admission) Patient's cognitive ability adequate to safely complete daily activities?: Yes Is the patient deaf or have difficulty hearing?: Yes Does the patient have difficulty seeing, even when wearing glasses/contacts?: No Does the patient have difficulty concentrating, remembering, or making decisions?: No Patient able to express need for assistance with ADLs?: Yes Does the patient have difficulty dressing or bathing?: Yes Independently performs ADLs?: Yes (appropriate for developmental age) Does the patient have difficulty walking or climbing stairs?: No Weakness of Legs: Both Weakness of Arms/Hands: Both  Permission Sought/Granted Permission sought to share information with : Case Manager Permission granted to share information with : Yes, Verbal Permission Granted  Share Information with NAME: Maryjean Ka  Permission granted to share info w AGENCY: [facility]  Permission granted to share info w Relationship: dtr  Permission granted to share info w Contact Information: [phone]  Emotional Assessment Appearance:: Appears stated age Attitude/Demeanor/Rapport: Gracious Affect (typically observed): Accepting Orientation: : Oriented to Self, Oriented to Place, Oriented to  Time, Oriented to Situation Alcohol / Substance Use: Not Applicable Psych Involvement: No (comment)  Admission diagnosis:  Epigastric pain [R10.13] Rectal bleeding [K62.5] Anticoagulated on Coumadin [Z79.01] Patient Active Problem List   Diagnosis Date Noted  . GI bleed 12/25/2018  . Bradycardia 11/25/2018  . Amputated great toe of right foot (HCC) 05/06/2018  . Peripheral vascular disease (HCC) 05/03/2018  . Localized, primary osteoarthritis of shoulder region 01/21/2018  . PAD (peripheral artery disease) (HCC) 06/27/2017  . UTI due to extended-spectrum beta lactamase (ESBL) producing Escherichia coli  05/04/2017  . Acute renal failure superimposed on  stage 3 chronic kidney disease (HCC) 02/20/2017  . Complication of feeding tube (HCC) 02/20/2017  . Abdominal wall cellulitis 02/20/2017  . PEG tube malfunction (HCC)   . COPD with asthma (HCC) 10/03/2016  . Chronic deep vein thrombosis (DVT) of tibial vein of left lower extremity (HCC) 10/03/2016  . Gangrene of toe of left foot (HCC) 09/15/2016  . Recurrent aspiration pneumonia (HCC)   . Atherosclerosis of native arteries of the extremities with gangrene (HCC) 07/28/2016  . Calcium channel blocker adverse reaction 06/08/2016  . Other specified hypothyroidism   . CHF (NYHA class IV, ACC/AHA stage D) (HCC)   . Shortness of breath   . Pressure injury of skin 03/11/2016  . PEG (percutaneous endoscopic gastrostomy) status (HCC) 03/10/2016  . Encounter for therapeutic drug monitoring 02/16/2016  . Warfarin anticoagulation   . Chronic seasonal allergic rhinitis   . Atrial fibrillation and flutter (HCC) 01/24/2016  . Chronic diastolic CHF (congestive heart failure) (HCC) 01/24/2016  . Bilateral leg edema 12/29/2015  . Hypothyroidism due to medication   . Weakness   . Pyloric stenosis   . Pressure ulcer 09/15/2015  . Acute osteomyelitis of toe of right foot (HCC)   . PVD (peripheral vascular disease) (HCC)   . Adjustment disorder with anxiety 08/29/2015  . Diabetic ulcer of toe of right foot associated with type 2 diabetes mellitus, with necrosis of muscle (HCC)   . Diverticulitis of large intestine without perforation or abscess without bleeding   . Colitis 08/26/2015  . Esophageal candidiasis (HCC)   . SOB (shortness of breath)   . Acute renal failure superimposed on stage 4 chronic kidney disease (HCC)   . Encounter for long-term (current) use of other high-risk medications   . Dysphagia 07/05/2015  . Asthma with allergic rhinitis 06/04/2015  . Hypertensive heart disease 03/18/2015  . Malnutrition of moderate degree 01/28/2015  . Syncope 01/27/2015  . Symptomatic bradycardia  01/21/2015  . Hypotension due to drugs 01/21/2015  . Protein-calorie malnutrition, severe 01/12/2015  . Cerebrovascular disease 07/14/2014  . Diabetic polyneuropathy associated with type 2 diabetes mellitus (HCC) 07/14/2014  . Hyperlipidemia 07/14/2014  . Gastric outlet obstruction 05/21/2014  . Unstable gait 04/29/2014  . Slurred speech   . Gastroparesis   . Constipation 02/03/2014  . Carotid stenosis 02/03/2014  . Demand ischemia (HCC) 01/21/2014  . Gastric bezoar 01/20/2014  . Anemia of chronic disease 01/18/2014  . Chronic kidney insufficiency, stage 3 (moderate) (HCC) 01/18/2014  . Depression 09/29/2013  . Essential hypertension, benign 09/22/2013  . GERD (gastroesophageal reflux disease) 09/22/2013  . Dyslipidemia 09/22/2013  . Diabetes mellitus type 2 in nonobese (HCC) 09/22/2013  . Persistent atrial fibrillation 09/22/2013  . History of completed stroke 09/22/2013  . Diabetic neuropathy (HCC) 09/22/2013  . Hypothyroidism 09/22/2013  . Chronic gastrojejunal ulcer with perforation (HCC) 09/07/2013  . Accelerated hypertension 09/07/2013  . Systemic lupus erythematosus (SLE) inhibitor (HCC) 09/07/2013  . Other specified abnormal immunological findings in serum 09/07/2013  . Red blood cell antibody positive 09/07/2013   PCP:  Sharon Seller, NP Pharmacy:   [facility] [phone] - [address] [place] - [address] [place] [address] Phone: [phone] Fax: [phone]  [facility] - [place], [place] - [address] [place] [address] Phone: [phone] Fax: [phone]     Social Determinants of Health (SDOH) Interventions    Readmission Risk Interventions No flowsheet data found.

## 2018-12-26 NOTE — Evaluation (Signed)
Physical Therapy Evaluation Patient Details Name: Shannon [name] MRN: [id number] DOB: 1923/04/14 Today's Date: 12/26/2018   History of Present Illness  85 y.o. female with medical history significant of DM, afib on chronic coumadin, DVT, CAD, PVD presents to ED with several day hx of constipation and bright red blood per rectum. Recent admission in August 2020 for evaluation of syncopal episode.  Clinical Impression  Pt admitted with above diagnosis.  Pt currently with functional limitations due to the deficits listed below (see PT Problem List). Pt will benefit from skilled PT to increase their independence and safety with mobility to allow discharge to the venue listed below.  Pt assisted with ambulating within room and using BSC.  Pt reports family assists at home if needed however she is typically modified independent with her rollator.  Recommend 24/7 assist as pt requiring some physical assist today and HHPT upon d/c.     Follow Up Recommendations Home health PT;Supervision/Assistance - 24 hour    Equipment Recommendations  None recommended by PT    Recommendations for Other Services       Precautions / Restrictions Precautions Precautions: Fall Precaution Comments: G tube L UQ (06/2018) Restrictions Weight Bearing Restrictions: No      Mobility  Bed Mobility Overal bed mobility: Needs Assistance Bed Mobility: Supine to Sit     Supine to sit: Min guard;HOB elevated Sit to supine: Mod assist   General bed mobility comments: min/guard for safety  Transfers Overall transfer level: Needs assistance Equipment used: Rolling walker (2 wheeled) Transfers: Sit to/from Stand Sit to Stand: Mod assist Stand pivot transfers: Min assist;Mod assist;From elevated surface       General transfer comment: assist to rise and steady, min assist from [facility] (could use armrests)  Ambulation/Gait Ambulation/Gait assistance: Min assist Gait Distance (Feet): 7 Feet(x2) Assistive device:  Rolling walker (2 wheeled) Gait Pattern/deviations: Step-through pattern;Decreased stride length;Antalgic Gait velocity: decr   General Gait Details: verbal cues for RW positioning (typically uses rollator); ambulated over to [facility] (had loose BM) and then over to recliner  Stairs            Wheelchair Mobility    Modified Rankin (Stroke Patients Only)       Balance Overall balance assessment: Needs assistance Sitting-balance support: Bilateral upper extremity supported;Feet supported Sitting balance-Leahy Scale: Fair     Standing balance support: Bilateral upper extremity supported;During functional activity Standing balance-Leahy Scale: Poor Standing balance comment: requires UE support                             Pertinent Vitals/Pain Pain Assessment: Faces Faces Pain Scale: Hurts little more Pain Location: rectum with BM Pain Descriptors / Indicators: Burning;Discomfort Pain Intervention(s): Repositioned;Monitored during session    Home Living Family/patient expects to be discharged to:: Private residence Living Arrangements: Children Available Help at Discharge: Family;Available 24 hours/day Type of Home: House Home Access: Ramped entrance     Home Layout: One level Home Equipment: Walker - 4 wheels;Cane - single point;Hand held Careers information officer - 2 wheels;Bedside commode;Tub bench      Prior Function Level of Independence: Needs assistance   Gait / Transfers Assistance Needed: rollator for short ambulation distances   ADL's / Homemaking Assistance Needed: daughter helps with tub transfer per OT note in February 2020.        Hand Dominance        Extremity/Trunk Assessment   Upper Extremity Assessment Upper Extremity  Assessment: Generalized weakness    Lower Extremity Assessment Lower Extremity Assessment: Generalized weakness    Cervical / Trunk Assessment Cervical / Trunk Assessment: Kyphotic  Communication   Communication:  HOH;Other (comment)(low volume)  Cognition Arousal/Alertness: Awake/alert Behavior During Therapy: WFL for tasks assessed/performed Overall Cognitive Status: Within Functional Limits for tasks assessed                                 General Comments: appropriate and follows commands, dificult to understand speech at times      General Comments General comments (skin integrity, edema, etc.): Pt received on 2L supplemental O2 which was removed after transfer to [facility]. Pt on RA for remainder of session with O2 97 at end of session on RA. Pt left on RA, nursing notified.    Exercises     Assessment/Plan    PT Assessment Patient needs continued PT services  PT Problem List Decreased strength;Decreased mobility;Decreased balance;Decreased knowledge of use of DME;Decreased activity tolerance       PT Treatment Interventions DME instruction;Therapeutic exercise;Balance training;Gait training;Functional mobility training;Stair training;Therapeutic activities;Patient/family education    PT Goals (Current goals can be found in the Care Plan section)  Acute Rehab PT Goals Patient Stated Goal: not stated. PT Goal Formulation: With patient Time For Goal Achievement: 01/02/19 Potential to Achieve Goals: Good    Frequency Min 3X/week   Barriers to discharge        Co-evaluation               AM-PAC PT "6 Clicks" Mobility  Outcome Measure Help needed turning from your back to your side while in a flat bed without using bedrails?: A Little Help needed moving from lying on your back to sitting on the side of a flat bed without using bedrails?: A Little Help needed moving to and from a bed to a chair (including a wheelchair)?: A Little Help needed standing up from a chair using your arms (e.g., wheelchair or bedside chair)?: A Little Help needed to walk in hospital room?: A Little Help needed climbing 3-5 steps with a railing? : A Lot 6 Click Score: 17    End of  Session Equipment Utilized During Treatment: Gait belt Activity Tolerance: Patient tolerated treatment well Patient left: in chair;with chair alarm set;with call bell/phone within reach Nurse Communication: Mobility status PT Visit Diagnosis: Difficulty in walking, not elsewhere classified (R26.2)    Time: 4098-1191 PT Time Calculation (min) (ACUTE ONLY): 17 min   Charges:   PT Evaluation $PT Eval Low Complexity: 1 Low     Zenovia Jarred, PT, DPT Acute Rehabilitation Services Office: [phone] Pager: [phone]  Maida Sale E 12/26/2018, 12:00 PM

## 2018-12-27 ENCOUNTER — Other Ambulatory Visit: Payer: Self-pay

## 2018-12-27 ENCOUNTER — Other Ambulatory Visit: Payer: Self-pay | Admitting: Cardiology

## 2018-12-27 ENCOUNTER — Ambulatory Visit (INDEPENDENT_AMBULATORY_CARE_PROVIDER_SITE_OTHER): Payer: Medicare Other | Admitting: *Deleted

## 2018-12-27 DIAGNOSIS — Z5181 Encounter for therapeutic drug level monitoring: Secondary | ICD-10-CM

## 2018-12-27 DIAGNOSIS — I4892 Unspecified atrial flutter: Secondary | ICD-10-CM

## 2018-12-27 DIAGNOSIS — I4891 Unspecified atrial fibrillation: Secondary | ICD-10-CM

## 2018-12-27 LAB — TYPE AND SCREEN
ABO/RH(D): AB POS
Antibody Screen: POSITIVE
DAT, IgG: NEGATIVE

## 2018-12-27 LAB — POCT INR: INR: 2 (ref 2.0–3.0)

## 2018-12-27 NOTE — Patient Instructions (Signed)
Description   Pt's daughter present. Today take 2.5 tablets then continue taking 2 tablets daily except 3 tablets on Sundays. Recheck in 4 weeks.  Call [facility] with any new medications [phone].

## 2018-12-29 IMAGING — DX DG FOOT COMPLETE 3+V*L*
3 series · 3 of 3 positions shown · non-contrast
Comparison: 08/26/2014 foot radiographs

CLINICAL DATA: [AGE]/o F; increasing foot pain in the plantar and
anterior site of the foot.

EXAM:
LEFT FOOT - COMPLETE 3+ VIEW

[foot obl]
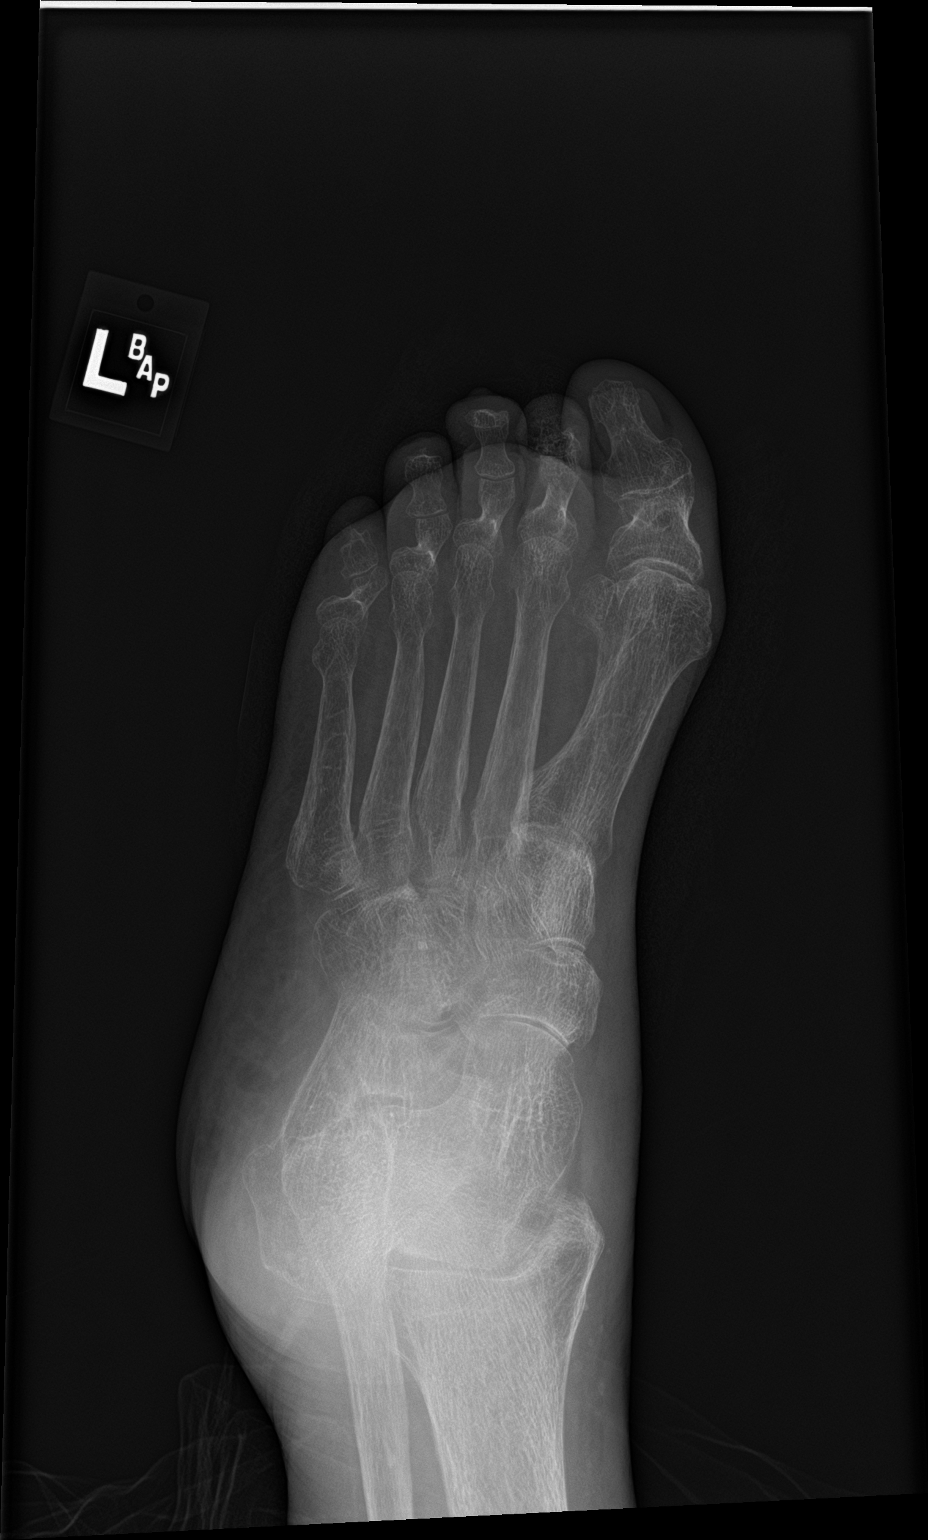

[foot lat]
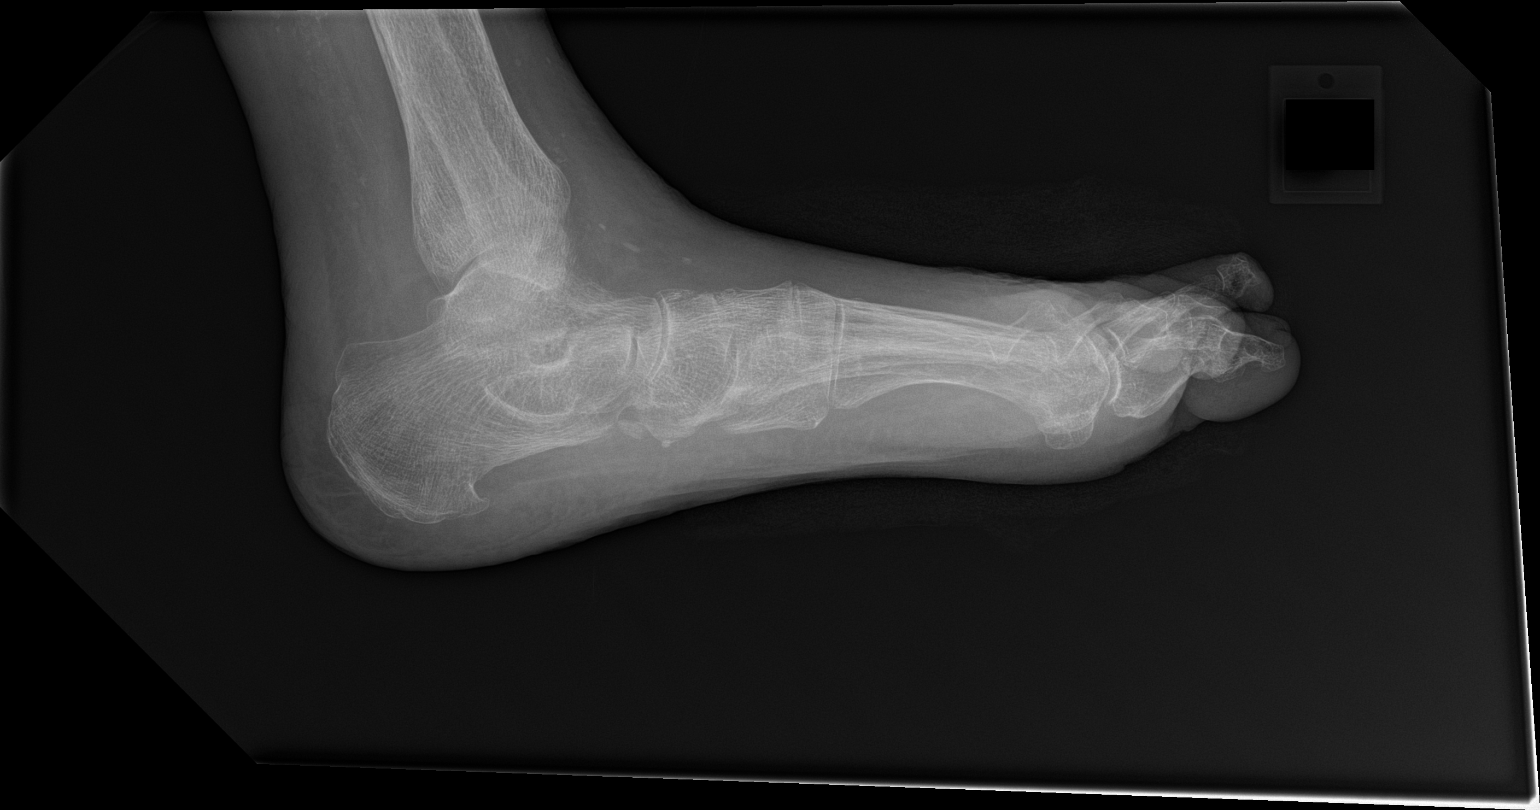

[foot ap]
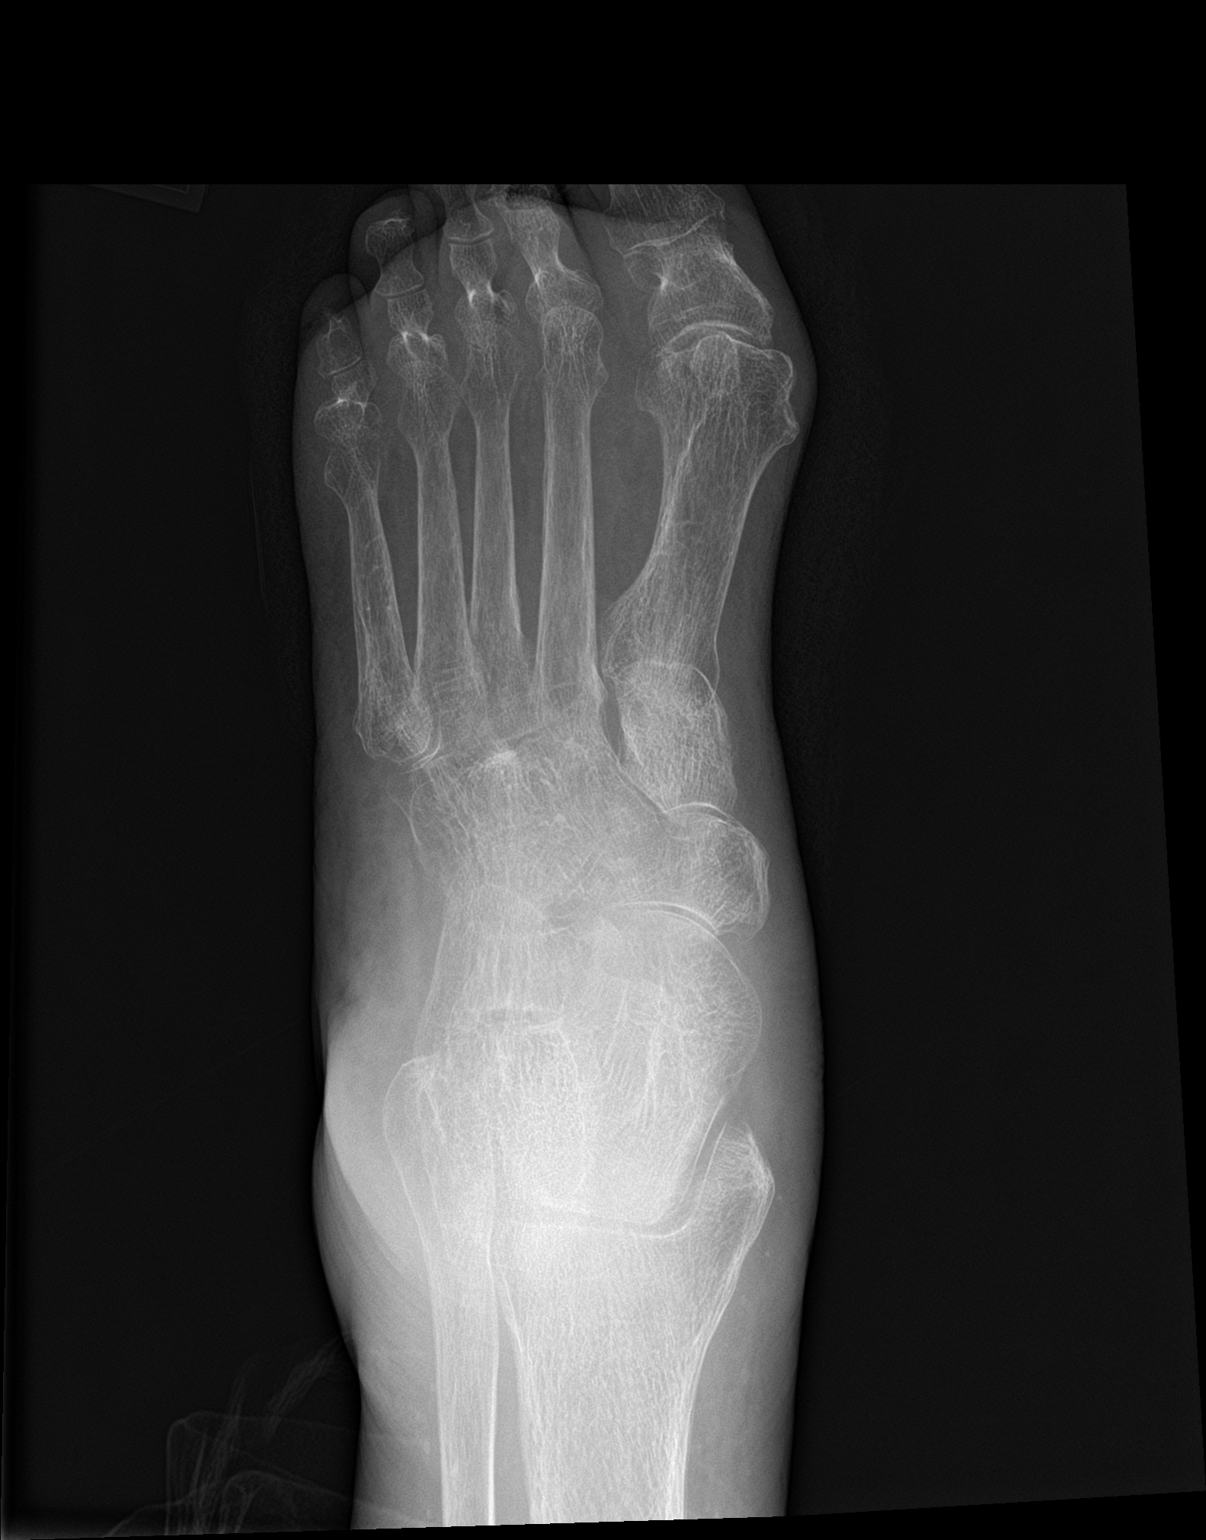

[3 of 3 positions shown; findings below may reference images not displayed]

FINDINGS: Bones are demineralized. Hallux valgus. Lisfranc alignment is
maintained. Pes planus. No acute fracture identified. Plantar
calcaneal enthesophyte. Vascular calcifications.
IMPRESSION: No acute fracture identified. Hallux valgus. Pes planus. Plantar
calcaneal enthesophyte.

By: Vova Hector M.D.

## 2018-12-29 IMAGING — MR MR FOOT*L* W/O CM
4 of 5 series · 26 of 40 positions shown · non-contrast
Comparison: None.

CLINICAL DATA: Diabetic left foot wound.  Second toe appears block.

EXAM:
MRI OF THE LEFT FOOT WITHOUT CONTRAST
TECHNIQUE: Multiplanar, multisequence MR imaging of the left forefoot was
performed. No intravenous contrast was administered.

[Series 5: T1 · coronal · 3.0mm · 0.27mm/px · 9 of 41 slices shown (1 of 2)]
[im 1/41]
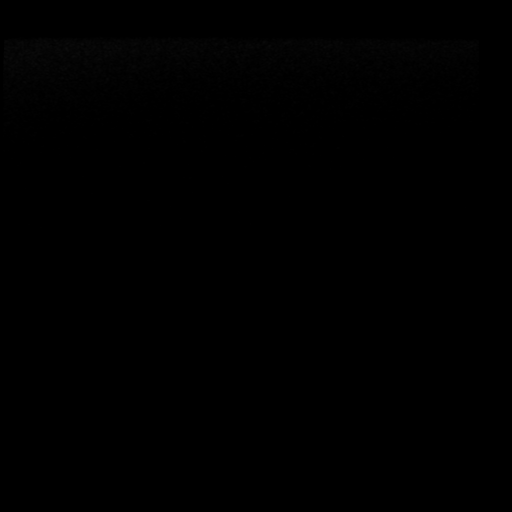
[im 5/41]
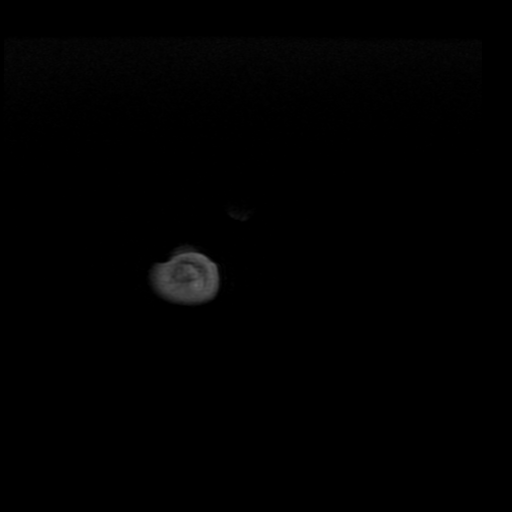
[im 9/41]
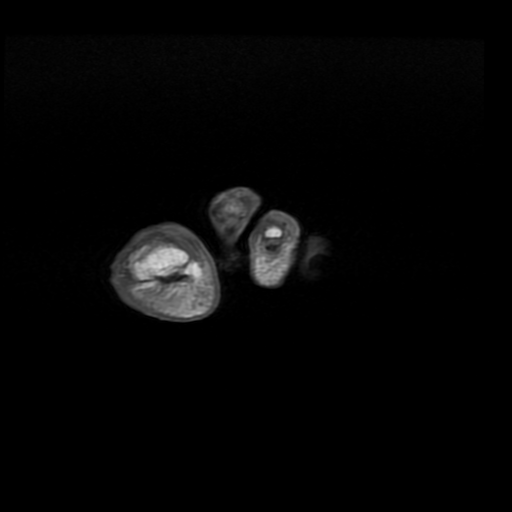
[im 13/41]
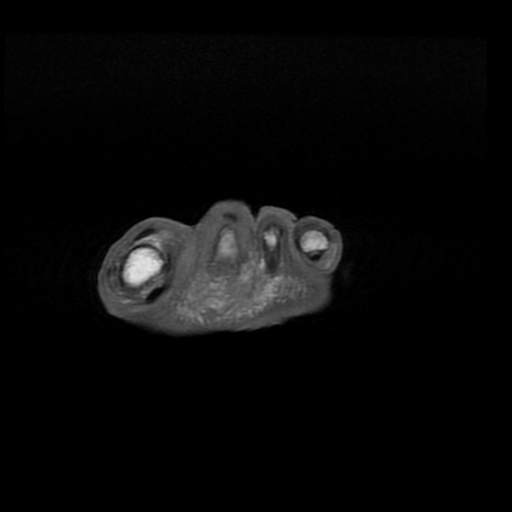
[im 17/41]
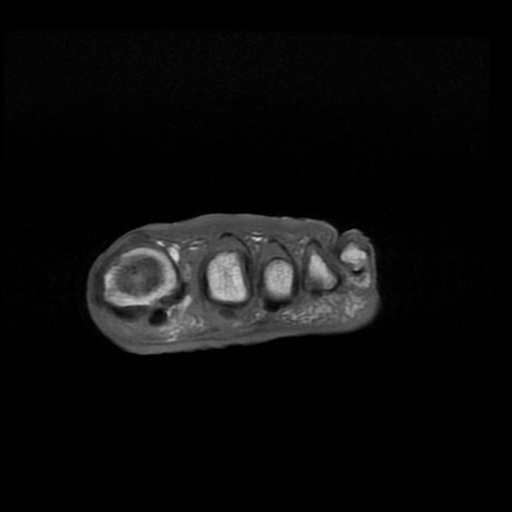
[im 21/41]
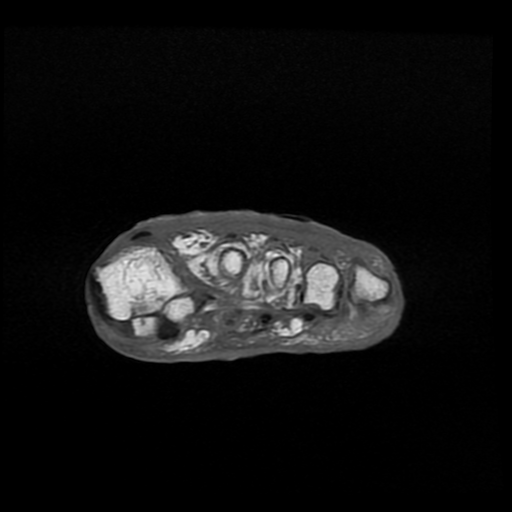
[im 25/41]
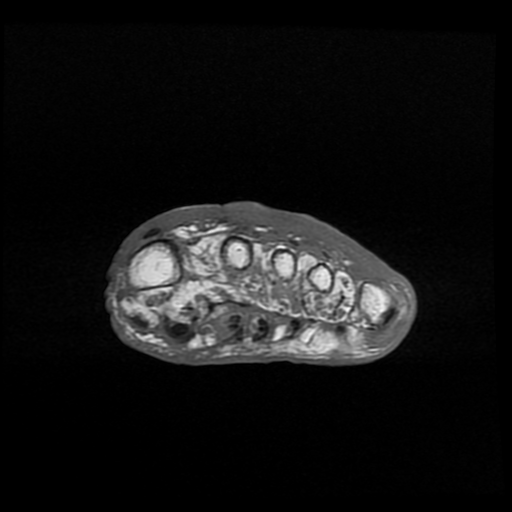
[im 29/41]
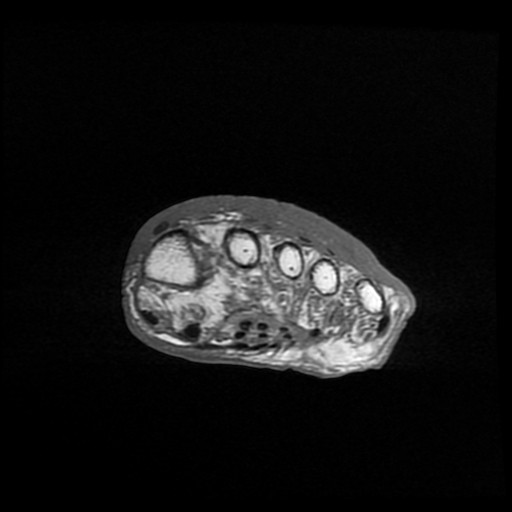
[im 37/41]
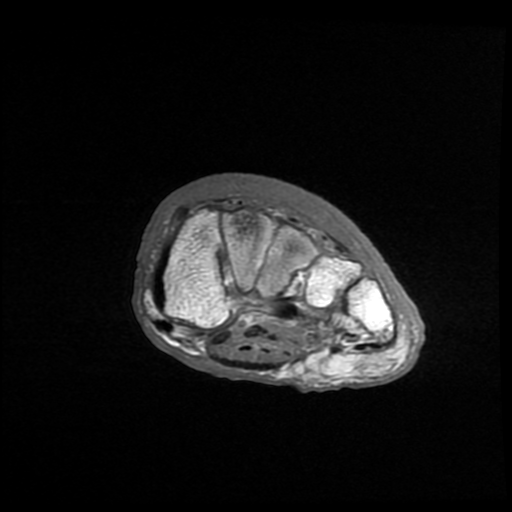

[Series 6: T2 fat-sat · coronal · 3.0mm · 0.55mm/px · 11 of 41 slices shown]
[im 1/41]
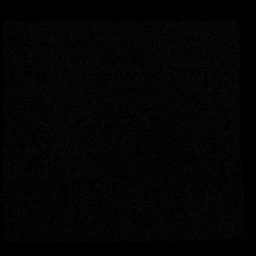
[im 5/41]
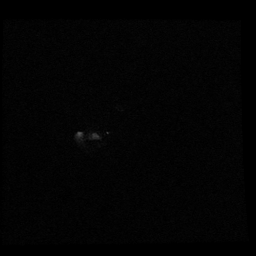
[im 9/41]
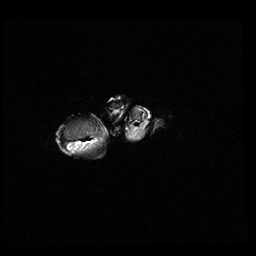
[im 13/41]
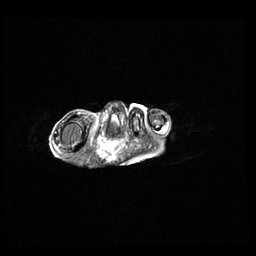
[im 17/41]
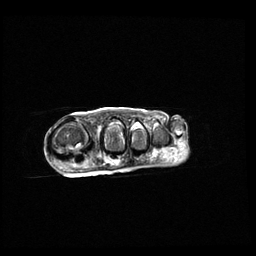
[im 21/41]
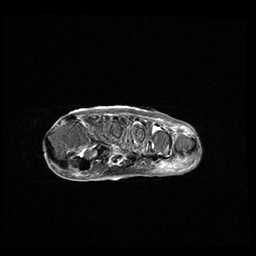
[im 25/41]
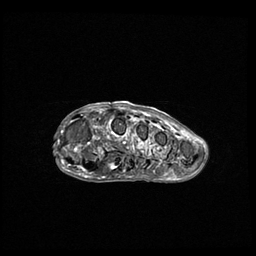
[im 29/41]
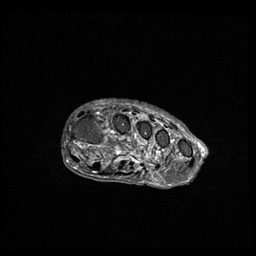
[im 33/41]
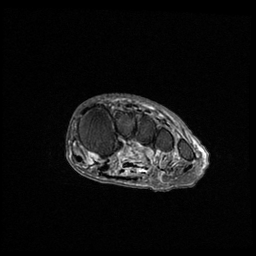
[im 37/41]
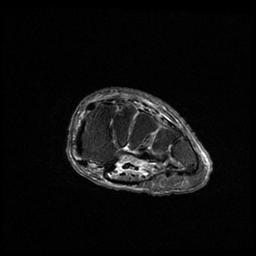
[im 41/41]
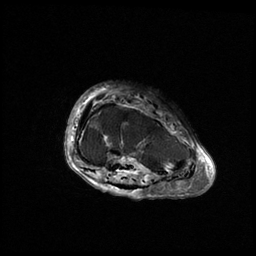

[Series 7: STIR · axial · 3.0mm · 0.31mm/px · z∈[-50,+5]mm · 3 of 18 slices shown]
[im 1/18]
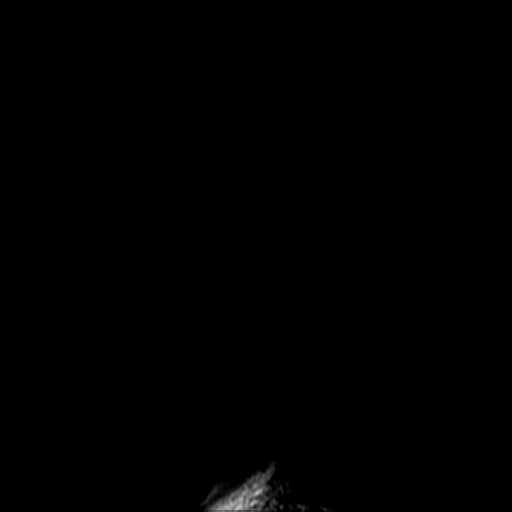
[im 9/18]
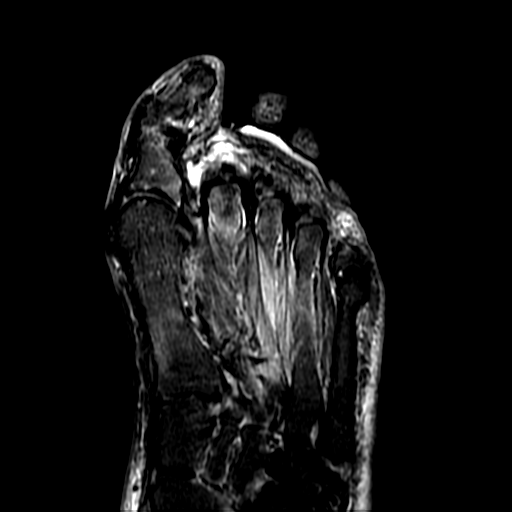
[im 18/18]
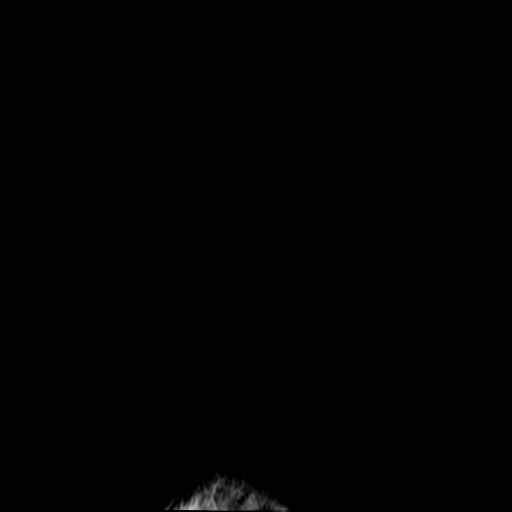

[Series 8: T1 · axial · 3.0mm · 0.31mm/px · z∈[-50,+5]mm · 3 of 18 slices shown (2 of 2)]
[im 1/18]
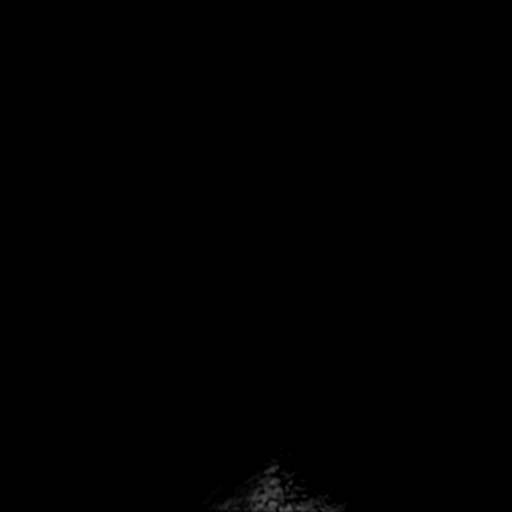
[im 9/18]
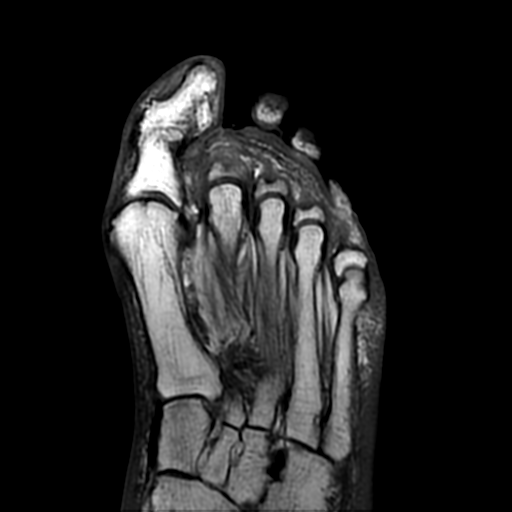
[im 18/18]
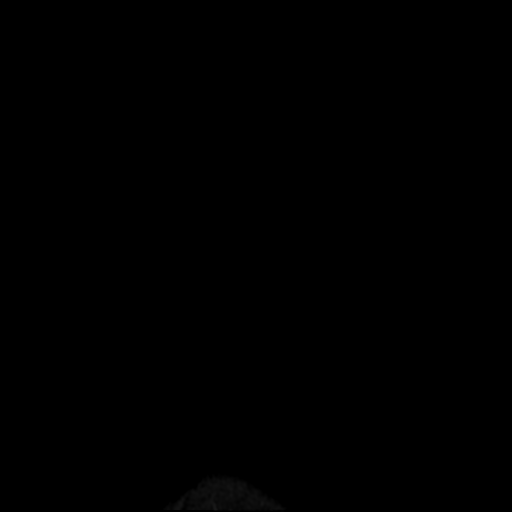

[26 of 40 positions shown; findings below may reference images not displayed]

FINDINGS: Bones/Joint/Cartilage

Marrow signal abnormality consistent with bone marrow edema is
identified involving the base of the second proximal phalanx. This
overlies an area of plantar soft tissue cellulitis with ulceration.
Despite lack of IV contrast there is suspicion for a subcutaneous
soft tissue abscess along the plantar aspect of the left second toe
measuring 6 x 3 x 7 mm characterized by irregular hypointensity
within the area of inflamed soft tissue. Similar plantar soft tissue
edema and/or cellulitis is seen along the plantar aspect of the
fourth and fifth digits.

Hallux valgus with osteoarthritic joint space narrowing is noted of
the great toe. DIP and PIP joint space narrowing as well as
interphalangeal joint space narrowing of the great toe are
identified.

Ligaments

Noncontributory

Muscles and Tendons

No intramuscular hemorrhage, hematoma or atrophy. The extensor and
flexor tendons of the forefoot are grossly intact without
sickle-cell abnormalities noted.

Soft tissues

Diffuse soft tissue swelling/cellulitis of the forefoot especially
along the dorsum.
IMPRESSION: 1. Marrow signal abnormality involving the base of the second
proximal phalanx overlying an area of soft tissue ulceration,
inflammation and possible tiny subcutaneous abscess measuring 6 x 3
x 7 mm. This raises concern for osteomyelitis though specificity is
somewhat limited by lack of IV contrast.
2. Plantar soft tissue inflammation also noted along the base of the
fourth and fifth digits without adjacent marrow signal
abnormalities.
3. Hallux valgus with osteoarthritic joint space narrowing of the
great toe. Lesser degree of the joint space narrowing is identified
of the PIP and DIP joints of the second through fifth digits.

## 2018-12-31 ENCOUNTER — Telehealth: Payer: Self-pay | Admitting: *Deleted

## 2018-12-31 NOTE — Telephone Encounter (Signed)
I have made the 1st attempt to contact the patient or family member in charge, in order to follow up from recently being discharged from the hospital. I left a message on voicemail but I will make another attempt at a different time.  

## 2019-01-01 ENCOUNTER — Ambulatory Visit (INDEPENDENT_AMBULATORY_CARE_PROVIDER_SITE_OTHER): Payer: Medicare Other | Admitting: Physician Assistant

## 2019-01-01 ENCOUNTER — Other Ambulatory Visit: Payer: Self-pay

## 2019-01-01 ENCOUNTER — Encounter: Payer: Self-pay | Admitting: Physician Assistant

## 2019-01-01 VITALS — BP 140/60 | HR 72 | Ht 66.0 in | Wt 143.4 lb

## 2019-01-01 DIAGNOSIS — N183 Chronic kidney disease, stage 3 unspecified: Secondary | ICD-10-CM

## 2019-01-01 DIAGNOSIS — I779 Disorder of arteries and arterioles, unspecified: Secondary | ICD-10-CM

## 2019-01-01 DIAGNOSIS — R55 Syncope and collapse: Secondary | ICD-10-CM

## 2019-01-01 DIAGNOSIS — I10 Essential (primary) hypertension: Secondary | ICD-10-CM

## 2019-01-01 DIAGNOSIS — I4819 Other persistent atrial fibrillation: Secondary | ICD-10-CM | POA: Diagnosis not present

## 2019-01-01 NOTE — Patient Instructions (Signed)
Medication Instructions:  Your physician recommends that you continue on your current medications as directed. Please refer to the Current Medication list given to you today.  If you need a refill on your cardiac medications before your next appointment, please call your pharmacy.   Lab work: TODAY: CBC, BMET  If you have labs (blood work) drawn today and your tests are completely normal, you will receive your results only by: [name] MyChart Message (if you have MyChart) OR . A paper copy in the mail If you have any lab test that is abnormal or we need to change your treatment, we will call you to review the results.  Testing/Procedures: Your physician has recommended that you wear an event monitor. Event monitors are medical devices that record the heart's electrical activity. Doctors most often [place] these monitors to diagnose arrhythmias. Arrhythmias are problems with the speed or rhythm of the heartbeat. The monitor is a small, portable device. You can wear one while you do your normal daily activities. This is usually used to diagnose what is causing palpitations/syncope (passing out).  Your physician has requested that you have a carotid duplex. This test is an ultrasound of the carotid arteries in your neck. It looks at blood flow through these arteries that supply the brain with blood. Allow one hour for this exam. There are no restrictions or special instructions.  Follow-Up: . Follow up with Shannon Barrios, PA with a VIRTUAL VISIT after your tests are complete  Any Other Special Instructions Will Be Listed Below (If Applicable).

## 2019-01-01 NOTE — Progress Notes (Signed)
Cardiology Office Note    Date:  01/01/2019   ID:  Ariza, Grieve [birth year], MRN [id number]  PCP:  Sharon Seller, NP  Cardiologist: Tobias Alexander, MD EPS: None  No chief complaint on file.   History of Present Illness:  Shannon [name] is a 85 y.o. female with history of chronic diastolic CHF, paroxysmal atrial fibrillation on Coumadin, gastric outlet obstruction s/p gastrojejunostomy feeding tube , nonobstructive CAD, history of CVA, COPD, CKD stage III, insulin-dependent type 2 diabetes, hypertension, hyperlipidemia, anemia of chronic disease, lower extremity PAD s/p bilateral interventions followed by VVS,hypothyroidism, and depression   Patient admitted with syncope 11/24/2018 possible secondary to bradycardia-coreg stopped. Troponins normal, CT negative, echo normal LVEF. Norvasc added for BP control. Not seen by Cardiology. Readmitted 12/26/18 with BRBPR in the setting of constipation felt to be a hemorrhoidal bleed. Hbg stable 10.1 coumadin continued.  Patient comes in for hospital f/u with her daughter. Her granddaughter was braiding her hair and patient passed out. Pulse ox didn't register so she gave her chest 3 thumps and oxygen and patient came around. She still has some dizziness at times more so in the am. No more bleeding.  BP running 140/70 at home.  Past Medical History:  Diagnosis Date  . Acute respiratory failure (HCC)   . Anemia    previous blood transfusions  . Arthritis    "all over"  . Asthma   . Atrial flutter (HCC)   . Bradycardia    requiring previous d/c of BB and reduction of amiodarone  . CAD (coronary artery disease)    nonobstructive per notes  . Chronic diastolic CHF (congestive heart failure) (HCC)   . CKD (chronic kidney disease), stage III (HCC)   . Complication of blood transfusion    "got the wrong blood type at [facility] in ~ 2015; no adverse reaction that we are aware of"/daughter, [name] (01/27/2016)  . COPD (chronic obstructive  pulmonary disease) (HCC)   . Depression    "light case"  . DVT (deep venous thrombosis) (HCC) 01/2016   a. LLE DVT 01/2016 - switched from Eliquis to Coumadin.  [name] Dyspnea    with some activity  . Gastric stenosis    a. s/p stomach tube  . GERD (gastroesophageal reflux disease)   . History of blood transfusion    "several" (01/27/2016)  . History of stomach ulcers   . Hyperlipidemia   . Hypertension   . Hypothyroidism   . On home oxygen therapy    "2L; 9PM til 9AM" (06/27/2017)  . PAD (peripheral artery disease) (HCC)    a. prior gangrene, toe amputations, intervention.  [name] PAF (paroxysmal atrial fibrillation) (HCC)   . Paraesophageal hernia   . Perforated gastric ulcer (HCC)   . Pneumonia    "a few times" (06/27/2017)  . Seasonal allergies   . SIADH (syndrome of inappropriate ADH production) (HCC)    [name] 01/10/2015  . Small bowel obstruction (HCC)    "I don't know how many" (01/11/2015)  . Stroke [facility])    several- no residual  . Type II diabetes mellitus (HCC)    "related to prednisone use  for > 20 yr; once predinose stopped; no more DM RX" (01/27/2016)  . UTI (urinary tract infection) 02/08/2016  . Ventral hernia with bowel obstruction     Past Surgical History:  Procedure Laterality Date  . ABDOMINAL AORTOGRAM N/A 09/21/2016   Procedure: Abdominal Aortogram;  Surgeon: Maeola Harman, MD;  Location: [facility]  INVASIVE CV LAB;  Service: Cardiovascular;  Laterality: N/A;  . AMPUTATION Left 09/25/2016   Procedure: AMPUTATION DIGIT- LEFT 2ND AND 3RD TOES;  Surgeon: Larina Earthly, MD;  Location: [facility] OR;  Service: Vascular;  Laterality: Left;  . AMPUTATION Right 05/03/2018   Procedure: AMPUTATION RIGHT GREAT TOE;  Surgeon: Larina Earthly, MD;  Location: [facility] OR;  Service: Vascular;  Laterality: Right;  . CATARACT EXTRACTION W/ INTRAOCULAR LENS  IMPLANT, BILATERAL    . CHOLECYSTECTOMY OPEN    . COLECTOMY    . ESOPHAGOGASTRODUODENOSCOPY N/A 01/19/2014   Procedure:  ESOPHAGOGASTRODUODENOSCOPY (EGD);  Surgeon: Hilarie Fredrickson, MD;  Location: [facility] ENDOSCOPY;  Service: Endoscopy;  Laterality: N/A;  . ESOPHAGOGASTRODUODENOSCOPY N/A 01/20/2014   Procedure: ESOPHAGOGASTRODUODENOSCOPY (EGD);  Surgeon: Hilarie Fredrickson, MD;  Location: [facility] ENDOSCOPY;  Service: Endoscopy;  Laterality: N/A;  . ESOPHAGOGASTRODUODENOSCOPY N/A 03/19/2014   Procedure: ESOPHAGOGASTRODUODENOSCOPY (EGD);  Surgeon: Rachael Fee, MD;  Location: [facility] ENDOSCOPY;  Service: Endoscopy;  Laterality: N/A;  . ESOPHAGOGASTRODUODENOSCOPY N/A 07/08/2015   Procedure: ESOPHAGOGASTRODUODENOSCOPY (EGD);  Surgeon: Sherrilyn Rist, MD;  Location: [facility] ENDOSCOPY;  Service: Endoscopy;  Laterality: N/A;  . ESOPHAGOGASTRODUODENOSCOPY (EGD) WITH PROPOFOL N/A 09/15/2015   Procedure: ESOPHAGOGASTRODUODENOSCOPY (EGD) WITH PROPOFOL;  Surgeon: Ruffin Frederick, MD;  Location: [facility] ENDOSCOPY;  Service: Gastroenterology;  Laterality: N/A;  . GASTROJEJUNOSTOMY     hx/notes 01/10/2015  . GASTROJEJUNOSTOMY N/A 09/23/2015   Procedure: OPEN GASTROJEJUNOSTOMY TUBE PLACEMENT;  Surgeon: De Blanch Kinsinger, MD;  Location: [facility] ORS;  Service: General;  Laterality: N/A;  . GLAUCOMA SURGERY Bilateral   . HERNIA REPAIR  2015  . IR CM INJ ANY COLONIC TUBE W/FLUORO  01/05/2017  . IR CM INJ ANY COLONIC TUBE W/FLUORO  06/07/2017  . IR CM INJ ANY COLONIC TUBE W/FLUORO  11/05/2017  . IR CM INJ ANY COLONIC TUBE W/FLUORO  09/18/2018  . IR GENERIC HISTORICAL  01/07/2016   IR GJ TUBE CHANGE 01/07/2016 Malachy Moan, MD [facility]INTER[facility]  . IR GENERIC HISTORICAL  01/27/2016   IR MECH REMOV OBSTRUC MAT ANY COLON TUBE W/FLUORO 01/27/2016 Richarda Overlie, MD [facility]  . IR GENERIC HISTORICAL  02/07/2016   IR PATIENT EVAL TECH 0-60 MINS Darrell K Allred, PA-C [facility]  . IR GENERIC HISTORICAL  02/08/2016   IR GJ TUBE CHANGE 02/08/2016 Berdine Dance, MD [facility]  . IR GENERIC HISTORICAL  01/06/2016   IR GJ TUBE CHANGE 01/06/2016 [facility]-RAD OUT REF  . IR  GENERIC HISTORICAL  05/02/2016   IR CM INJ ANY COLONIC TUBE W/FLUORO 05/02/2016 Oley Balm, MD [facility]  . IR GENERIC HISTORICAL  05/15/2016   IR GJ TUBE CHANGE 05/15/2016 Simonne Come, MD [facility]  . IR GENERIC HISTORICAL  06/28/2016   IR GJ TUBE CHANGE 06/28/2016 [facility]  . IR GJ TUBE CHANGE  02/20/2017  . IR GJ TUBE CHANGE  05/10/2017  . IR GJ TUBE CHANGE  07/06/2017  . IR GJ TUBE CHANGE  08/02/2017  . IR GJ TUBE CHANGE  10/15/2017  . IR GJ TUBE CHANGE  12/26/2017  . IR GJ TUBE CHANGE  04/08/2018  . IR GJ TUBE CHANGE  06/19/2018  . IR PATIENT EVAL TECH 0-60 MINS  10/19/2016  . IR PATIENT EVAL TECH 0-60 MINS  12/25/2016  . IR PATIENT EVAL TECH 0-60 MINS  01/29/2017  . IR PATIENT EVAL TECH 0-60 MINS  04/04/2017  . IR PATIENT EVAL TECH 0-60 MINS  04/30/2017  . IR PATIENT EVAL TECH 0-60  MINS  07/31/2017  . IR REPLC DUODEN/JEJUNO TUBE PERCUT W/FLUORO  11/14/2016  . LAPAROTOMY N/A 01/20/2015   Procedure: EXPLORATORY LAPAROTOMY;  Surgeon: Abigail Miyamoto, MD;  Location: [facility] OR;  Service: General;  Laterality: N/A;  . LOWER EXTREMITY ANGIOGRAPHY Left 09/21/2016   Procedure: Lower Extremity Angiography;  Surgeon: Maeola Harman, MD;  Location: [facility] INVASIVE CV LAB;  Service: Cardiovascular;  Laterality: Left;  . LOWER EXTREMITY ANGIOGRAPHY Right 06/27/2017   Procedure: Lower Extremity Angiography;  Surgeon: Maeola Harman, MD;  Location: [facility] INVASIVE CV LAB;  Service: Cardiovascular;  Laterality: Right;  . LYSIS OF ADHESION N/A 01/20/2015   Procedure: LYSIS OF ADHESIONS < 1 HOUR;  Surgeon: Abigail Miyamoto, MD;  Location: [facility] OR;  Service: General;  Laterality: N/A;  . PERIPHERAL VASCULAR BALLOON ANGIOPLASTY Left 09/21/2016   Procedure: Peripheral Vascular Balloon Angioplasty;  Surgeon: Maeola Harman, MD;  Location: [facility] INVASIVE CV LAB;  Service: Cardiovascular;  Laterality: Left;  drug coated balloon  . PERIPHERAL VASCULAR BALLOON ANGIOPLASTY Right 06/27/2017    Procedure: PERIPHERAL VASCULAR BALLOON ANGIOPLASTY;  Surgeon: Maeola Harman, MD;  Location: [facility] INVASIVE CV LAB;  Service: Cardiovascular;  Laterality: Right;  SFA/TPTRUNK  . TONSILLECTOMY    . TUBAL LIGATION    . VENTRAL HERNIA REPAIR  2015   incarcerated ventral hernia ([facility] 09/2013)/notes 01/10/2015    Current Medications: Current Meds  Medication Sig  . acetaminophen (TYLENOL) 500 MG tablet Take 500 mg by mouth every 6 (six) hours as needed (pain).   . Amino Acids-Protein Hydrolys (FEEDING SUPPLEMENT, PRO-STAT SUGAR FREE 64,) LIQD Take 30 mLs by mouth 2 (two) times daily.  [name] amLODipine (NORVASC) 5 MG tablet Take 1 tablet (5 mg total) by mouth daily.  [name] atorvastatin (LIPITOR) 10 MG tablet Take 1 tablet (10 mg total) by mouth daily.  [name] b complex vitamins tablet Take 1 tablet by mouth daily.   . calcium-vitamin D (OSCAL WITH D) 500-200 MG-UNIT per tablet Take 1 tablet by mouth daily with breakfast.   . Carboxymethylcellulose Sodium (ARTIFICIAL TEARS OP) Place 1 drop into both eyes 4 (four) times daily.  . collagenase (SANTYL) ointment Apply 1 application topically daily as needed (wound care).   . cyclobenzaprine (FLEXERIL) 5 MG tablet Take 1 tablet (5 mg total) by mouth 3 (three) times daily as needed for muscle spasms.  . cycloSPORINE (RESTASIS) 0.05 % ophthalmic emulsion USE 1 DROP INTO BOTH EYES TWICE DAILY  . feeding supplement (OSMOLITE 1 CAL) LIQD Take 711 mLs by mouth See admin instructions. For 12 hours 2100 - 0900  . ferrous sulfate 325 (65 FE) MG tablet TAKE 1 TABLET BY MOUTH EVERY DAY  . fluticasone (FLONASE) 50 MCG/ACT nasal spray SPRAY 2 SPRAYS INTO EACH NOSTRIL EVERY DAY  . furosemide (LASIX) 40 MG tablet TAKE 1 TABLET BY MOUTH DAILY  . gabapentin (NEURONTIN) 300 MG capsule TAKE 1 CAPSULE BY MOUTH TWICE A DAY  . gentamicin cream (GARAMYCIN) 0.1 % Apply 1 application topically 3 (three) times daily as needed (dry skin).   . hydrALAZINE (APRESOLINE) 50 MG tablet TAKE 1  TABLET BY MOUTH THREE TIMES A DAY  . insulin aspart (NOVOLOG) 100 UNIT/ML FlexPen Inject 0-6 Units into the skin 3 (three) times daily with meals. New sliding Scale If BS is 151 - 251 = 2 units,  If BS is 251 -  350 = 4 units,  If BS is 351 - 450 = 6 units Call provider if BS runs more than 400  .  insulin glargine (LANTUS) 100 UNIT/ML injection Inject 8 Units into the skin at bedtime.   . Insulin Pen Needle (B-D UF III MINI PEN NEEDLES) 31G X 5 MM MISC Use twice daily with giving insulin injections. Dx E11.9  . ipratropium-albuterol (DUONEB) 0.5-2.5 (3) MG/3ML SOLN Take 3 mLs by nebulization every 6 (six) hours as needed (sob). Use 3 times daily x 4 days then every 6 hours as needed.  . isosorbide mononitrate (IMDUR) 30 MG 24 hr tablet TAKE 2 TABLETS BY MOUTH DAILY. PLEASE MAKE ANNUAL APPT WITH DR. Delton See FOR FUTURE REFILLS. THANK YOU. 1ST ATTEMPT.  . latanoprost (XALATAN) 0.005 % ophthalmic solution Place 1 drop into both eyes at bedtime.  . levalbuterol (XOPENEX HFA) 45 MCG/ACT inhaler Inhale 2 puffs into the lungs every 6 (six) hours as needed for wheezing or shortness of breath.  . levothyroxine (SYNTHROID, LEVOTHROID) 125 MCG tablet Take 1 tablet (125 mcg total) by mouth daily.  [name] loratadine (CLARITIN) 10 MG tablet Take 10 mg by mouth daily as needed for allergies.  . mometasone-formoterol (DULERA) 200-5 MCG/ACT AERO TAKE TWO PUFFS BY MOUTH TWICE DAILY  . Multiple Vitamin (MULTIVITAMIN WITH MINERALS) TABS tablet Take 1 tablet by mouth daily.  [name] nystatin (MYCOSTATIN) 100000 UNIT/ML suspension Take 5 mLs (500,000 Units total) by mouth 4 (four) times daily.  [name] nystatin (MYCOSTATIN/NYSTOP) powder Apply 1 g topically as needed (skin irritation).   [name] oxyCODONE (ROXICODONE) 5 MG immediate release tablet Take 1 tablet (5 mg total) by mouth every 4 (four) hours as needed for severe pain. May take 1/2 tab q4hrs as needed for mild pain. (Patient taking differently: Take 2.5-5 mg by mouth every 4 (four) hours  as needed for severe pain. )  . OXYGEN Inhale 2 L into the lungs at bedtime.  . pantoprazole (PROTONIX) 40 MG tablet TAKE 1 TABLET BY MOUTH TWICE A DAY  . sertraline (ZOLOFT) 100 MG tablet TAKE 1 TABLET BY MOUTH EVERY DAY FOR DEPRESSION  . warfarin (COUMADIN) 2.5 MG tablet TAKE AS DIRECTED BY COUMADIN CLINIC. (Patient taking differently: Take 5-7.5 mg by mouth See admin instructions. Take 5mg  by mouth daily, except on Sunday, take 7.5mg  by mouth)  . Water For Irrigation, Sterile (FREE WATER) SOLN Place 150 mLs into feeding tube 3 (three) times daily.   Cliffton Asters Petrolatum-Mineral Oil (SYSTANE NIGHTTIME) OINT Place 1 application into both eyes at bedtime.      Allergies:   Penicillins   Social History   Socioeconomic History  . Marital status: Widowed    Spouse name: Not on file  . Number of children: 3  . Years of education: Not on file  . Highest education level: Not on file  Occupational History  . Not on file  Social Needs  . Financial resource strain: Not hard at all  . Food insecurity    Worry: Never true    Inability: Never true  . Transportation needs    Medical: No    Non-medical: No  Tobacco Use  . Smoking status: Never Smoker  . Smokeless tobacco: Former Neurosurgeon    Types: Snuff  . Tobacco comment: "used snuff in my younger days"  Substance and Sexual Activity  . Alcohol use: Never    Alcohol/week: 0.0 standard drinks    Frequency: Never  . Drug use: Never  . Sexual activity: Not Currently  Lifestyle  . Physical activity    Days per week: 0 days    Minutes per session: 0 min  . Stress:  Not at all  Relationships  . Social connections    Talks on phone: More than three times a week    Gets together: More than three times a week    Attends religious service: Never    Active member of club or organization: No    Attends meetings of clubs or organizations: Never    Relationship status: Widowed  Other Topics Concern  . Not on file  Social History Narrative    Married in [place], lives in one story house with 2 people and no pets   Occupation: ?   Has a living Will, [place], and doesn't have DNR   Was living with Husband (in frail health) in [place] until 09/2013.  After her [facility] admission they both came to live with daughter in [place]     Family History:  The patient's   family history includes Diabetes in her brother; Glaucoma in her son and son; Hypertension in her mother; Stroke in her mother.   ROS:   Please see the history of present illness.    ROS All other systems reviewed and are negative.   PHYSICAL EXAM:   VS:  BP 140/60   Pulse 72   Ht 5\' 6"  (1.676 m)   Wt 143 lb 6.4 oz (65 kg)   SpO2 98%   BMI 23.15 kg/m   Physical Exam  ZOX:WRUEAVW, in no acute distress  Neck: no JVD, carotid bruits, or masses Cardiac:RRR;with some skipping 1/6 systolic murmur LSB  Respiratory:  clear to auscultation bilaterally, normal work of breathing GI: soft, nontender, nondistended, + BS Ext: without cyanosis, clubbing, or edema, Good distal pulses bilaterally Neuro:  Alert and Oriented x 3 Psych: euthymic mood, full affect  Wt Readings from Last 3 Encounters:  01/01/19 143 lb 6.4 oz (65 kg)  12/26/18 137 lb 12.6 oz (62.5 kg)  12/25/18 143 lb 6.4 oz (65 kg)      Studies/Labs Reviewed:   EKG:  EKG is not ordered today.  The ekg reviewed from ER 11/24/18 NSR no acute change Recent Labs: 10/15/2018: TSH 0.98 11/25/2018: B Natriuretic Peptide 233.9 12/26/2018: ALT 19; BUN 52; Creatinine, Ser 1.28; Hemoglobin 9.8; Platelets 225; Potassium 4.0; Sodium 138   Lipid Panel    Component Value Date/Time   CHOL 131 10/15/2018 1122   CHOL 126 04/07/2015 1015   TRIG 188 (H) 10/15/2018 1122   HDL 47 (L) 10/15/2018 1122   HDL 69 04/07/2015 1015   CHOLHDL 2.8 10/15/2018 1122   VLDL 26 12/24/2013 0456   LDLCALC 58 10/15/2018 1122    Additional studies/ records that were reviewed today include:  Echo 11/25/18 IMPRESSIONS      1. The left  ventricle has normal systolic function with an ejection fraction of 60-65%. The cavity size was normal. Left ventricular diastolic Doppler parameters are consistent with pseudonormalization. Elevated left ventricular end-diastolic pressure.  2. The right ventricle has normal systolic function. The cavity was normal. There is no increase in right ventricular wall thickness. Right ventricular systolic pressure is normal.  3. Left atrial size was mildly dilated.  4. There is moderate mitral annular calcification present.  5. The aortic valve is tricuspid. Aortic valve regurgitation is mild by color flow Doppler. Severely calcified left coronary cusp.  6. The aorta is normal unless otherwise noted.   FINDINGS  Left Ventricle: The left ventricle has normal systolic function, with an ejection fraction of 60-65%. The cavity size was normal. There is no increase in left ventricular  wall thickness. Left ventricular diastolic Doppler parameters are consistent with  pseudonormalization. Elevated left ventricular end-diastolic pressure   Right Ventricle: The right ventricle has normal systolic function. The cavity was normal. There is no increase in right ventricular wall thickness. Right ventricular systolic pressure is normal.   Left Atrium: Left atrial size was mildly dilated.   Right Atrium: Right atrial size was normal in size.   Interatrial Septum: No atrial level shunt detected by color flow Doppler.   Pericardium: There is no evidence of pericardial effusion.   Mitral Valve: The mitral valve is normal in structure. There is moderate mitral annular calcification present. Mitral valve regurgitation is mild by color flow Doppler.   Tricuspid Valve: The tricuspid valve is normal in structure. Tricuspid valve regurgitation is mild by color flow Doppler.   Aortic Valve: The aortic valve is tricuspid Aortic valve regurgitation is mild by color flow Doppler. Severely calcified left coronary cusp.    Pulmonic Valve: The pulmonic valve was normal in structure. Pulmonic valve regurgitation is trivial by color flow Doppler.   Aorta: The aorta is normal unless otherwise noted.   Venous: The inferior vena cava measures 1.24 cm, is normal in size with greater than 50% respiratory variability.    carotids 2015 mild plaque bilaterally 1-39%     ASSESSMENT:    1. Persistent atrial fibrillation   2. Syncope, unspecified syncope type   3. Essential hypertension, benign   4. Chronic kidney insufficiency, stage 3 (moderate) (HCC)      PLAN:  In order of problems listed above:  PAF/Flutter-syncope 11/2018 felt secondary to bradycardia and coreg stopped. No further syncope but does have some dizziness in am. Will check 30 monitor and carotid dopplers.f/u labs  GI bleed felt secondary to hemorrhoids 12/2018 coumadin continued. Last Hbg 9.8 12/26/18-recheck today.  Essential HTN-Norvasc added in hospital when coreg stopped BP stable  Chronic diastolic CHF-echo 11/2018 normal LVEF with diastolic dysfunction compensated    Medication Adjustments/Labs and Tests Ordered: Current medicines are reviewed at length with the patient today.  Concerns regarding medicines are outlined above.  Medication changes, Labs and Tests ordered today are listed in the Patient Instructions below. Patient Instructions  Medication Instructions:  Your physician recommends that you continue on your current medications as directed. Please refer to the Current Medication list given to you today.  If you need a refill on your cardiac medications before your next appointment, please call your pharmacy.   Lab work: TODAY: CBC, BMET  If you have labs (blood work) drawn today and your tests are completely normal, you will receive your results only by: [name] MyChart Message (if you have MyChart) OR . A paper copy in the mail If you have any lab test that is abnormal or we need to change your treatment, we will call you to  review the results.  Testing/Procedures: Your physician has recommended that you wear an event monitor. Event monitors are medical devices that record the heart's electrical activity. Doctors most often [place] these monitors to diagnose arrhythmias. Arrhythmias are problems with the speed or rhythm of the heartbeat. The monitor is a small, portable device. You can wear one while you do your normal daily activities. This is usually used to diagnose what is causing palpitations/syncope (passing out).  Your physician has requested that you have a carotid duplex. This test is an ultrasound of the carotid arteries in your neck. It looks at blood flow through these arteries that supply the brain with blood.  Allow one hour for this exam. There are no restrictions or special instructions.  Follow-Up: . Follow up with Jacolyn Reedy, PA with a VIRTUAL VISIT after your tests are complete  Any Other Special Instructions Will Be Listed Below (If Applicable).       Signed, Jacolyn Reedy, PA-C  01/01/2019 1:43 PM    [facility] [address] [place], [place], [place]  [address] Phone: [phone]; Fax: [phone]

## 2019-01-02 ENCOUNTER — Other Ambulatory Visit: Payer: Self-pay

## 2019-01-02 DIAGNOSIS — N183 Chronic kidney disease, stage 3 unspecified: Secondary | ICD-10-CM

## 2019-01-02 LAB — BASIC METABOLIC PANEL
BUN/Creatinine Ratio: 43 — ABNORMAL HIGH (ref 12–28)
BUN: 69 mg/dL — ABNORMAL HIGH (ref 10–36)
CO2: 26 mmol/L (ref 20–29)
Calcium: 9.7 mg/dL (ref 8.7–10.3)
Chloride: 96 mmol/L (ref 96–106)
Creatinine, Ser: 1.61 mg/dL — ABNORMAL HIGH (ref 0.57–1.00)
GFR calc Af Amer: 32 mL/min/{1.73_m2} — ABNORMAL LOW (ref >59)
GFR calc non Af Amer: 27 mL/min/{1.73_m2} — ABNORMAL LOW (ref >59)
Glucose: 94 mg/dL (ref 65–99)
Potassium: 4.8 mmol/L (ref 3.5–5.2)
Sodium: 137 mmol/L (ref 134–144)

## 2019-01-02 LAB — CBC
Hematocrit: 30 % — ABNORMAL LOW (ref 34.0–46.6)
Hemoglobin: 9.6 g/dL — ABNORMAL LOW (ref 11.1–15.9)
MCH: 29.6 pg (ref 26.6–33.0)
MCHC: 32 g/dL (ref 31.5–35.7)
MCV: 93 fL (ref 79–97)
Platelets: 243 10*3/uL (ref 150–450)
RBC: 3.24 x10E6/uL — ABNORMAL LOW (ref 3.77–5.28)
RDW: 14.1 % (ref 11.7–15.4)
WBC: 6 10*3/uL (ref 3.4–10.8)

## 2019-01-02 MED ORDER — FUROSEMIDE 40 MG PO TABS: 20.0000 mg | ORAL_TABLET | Freq: Every day | ORAL | 2 refills | Status: DC

## 2019-01-07 NOTE — Telephone Encounter (Signed)
Transition Care Management Follow-up Telephone Call  Date of discharge and from where: 12/26/2018 [facility]  How have you been since you were released from the hospital? Better  Any questions or concerns? No   Items Reviewed:  Did the pt receive and understand the discharge instructions provided? Yes   Medications obtained and verified? Yes   Any new allergies since your discharge? No   Dietary orders reviewed? Yes  Do you have support at home? Yes   Other (ie: DME, Home Health, etc) [place]  Functional Questionnaire: (I = Independent and D = Dependent) ADL's: I with assistance  Bathing/Dressing- I with assistance   Meal Prep- D  Eating- I  Maintaining continence- I  Transferring/Ambulation- I with assistance  Managing Meds- I with assistance   Follow up appointments reviewed:    PCP Hospital f/u appt confirmed? Yes  Scheduled to see Dinah on 10/8.  [facility] f/u appt confirmed? Yes  Scheduled to see Cardiologist .  Are transportation arrangements needed? No   If their condition worsens, is the pt aware to call  their PCP or go to the ED? Yes  Was the patient provided with contact information for the PCP's office or ED? Yes  Was the pt encouraged to call back with questions or concerns? Yes

## 2019-01-08 ENCOUNTER — Ambulatory Visit (HOSPITAL_COMMUNITY)
Admission: RE | Admit: 2019-01-08 | Discharge: 2019-01-08 | Disposition: A | Discharge status: Home / Self Care | Payer: Medicare Other | Source: Ambulatory Visit | Attending: Cardiology | Admitting: Cardiology

## 2019-01-08 ENCOUNTER — Other Ambulatory Visit: Payer: Self-pay

## 2019-01-08 DIAGNOSIS — I10 Essential (primary) hypertension: Secondary | ICD-10-CM | POA: Diagnosis not present

## 2019-01-08 DIAGNOSIS — R55 Syncope and collapse: Secondary | ICD-10-CM | POA: Insufficient documentation

## 2019-01-08 DIAGNOSIS — N183 Chronic kidney disease, stage 3 unspecified: Secondary | ICD-10-CM | POA: Diagnosis not present

## 2019-01-08 DIAGNOSIS — I4819 Other persistent atrial fibrillation: Secondary | ICD-10-CM | POA: Insufficient documentation

## 2019-01-09 ENCOUNTER — Telehealth: Payer: Self-pay | Admitting: *Deleted

## 2019-01-09 ENCOUNTER — Ambulatory Visit (INDEPENDENT_AMBULATORY_CARE_PROVIDER_SITE_OTHER): Payer: Medicare Other | Admitting: Adult Health

## 2019-01-09 ENCOUNTER — Other Ambulatory Visit: Payer: Self-pay | Admitting: *Deleted

## 2019-01-09 DIAGNOSIS — K625 Hemorrhage of anus and rectum: Secondary | ICD-10-CM

## 2019-01-09 DIAGNOSIS — I4892 Unspecified atrial flutter: Secondary | ICD-10-CM

## 2019-01-09 DIAGNOSIS — N1832 Chronic kidney disease, stage 3b: Secondary | ICD-10-CM | POA: Diagnosis not present

## 2019-01-09 DIAGNOSIS — F32A Depression, unspecified: Secondary | ICD-10-CM

## 2019-01-09 DIAGNOSIS — I4891 Unspecified atrial fibrillation: Secondary | ICD-10-CM

## 2019-01-09 DIAGNOSIS — I10 Essential (primary) hypertension: Secondary | ICD-10-CM | POA: Diagnosis not present

## 2019-01-09 DIAGNOSIS — E1121 Type 2 diabetes mellitus with diabetic nephropathy: Secondary | ICD-10-CM

## 2019-01-09 DIAGNOSIS — E038 Other specified hypothyroidism: Secondary | ICD-10-CM | POA: Diagnosis not present

## 2019-01-09 DIAGNOSIS — Z7901 Long term (current) use of anticoagulants: Secondary | ICD-10-CM | POA: Diagnosis not present

## 2019-01-09 DIAGNOSIS — Z794 Long term (current) use of insulin: Secondary | ICD-10-CM

## 2019-01-09 DIAGNOSIS — F329 Major depressive disorder, single episode, unspecified: Secondary | ICD-10-CM | POA: Diagnosis not present

## 2019-01-09 DIAGNOSIS — E1122 Type 2 diabetes mellitus with diabetic chronic kidney disease: Secondary | ICD-10-CM

## 2019-01-09 MED ORDER — SERTRALINE HCL 50 MG PO TABS: 50.0000 mg | ORAL_TABLET | Freq: Every day | ORAL | 3 refills | Status: DC

## 2019-01-09 NOTE — Patient Instructions (Signed)
1. Depression, unspecified depression type - requested to decrease dosage of Zoloft and eventually gets off medication - decrease Zoloft from 100 mg to 50 mg daily - sertraline (ZOLOFT) 50 MG tablet; Take 1 tablet (50 mg total) by mouth daily.  Dispense: 30 tablet; Refill: 3  2. Atrial fibrillation and flutter ([place]) - rate-controlled, cardiologist recommended to continue Coumadin  3. BRBPR (bright red blood per rectum) - hgb was stable, thought to be from constipation/hemmorrhoidal bleed  4. Essential hypertension, benign - reported BP this morning 135/60  - continue Amlodipine  5. Warfarin anticoagulation - will have next INR on 10/23, cardiologist recommended to continue Coumadin  6. Other specified hypothyroidism Lab Results  Component Value Date   TSH 0.98 10/15/2018  - continue Levothyroxine  7. Type 2 diabetes mellitus with chronic kidney disease ([place]) Lab Results  Component Value Date   HGBA1C 6.3 (H) 10/15/2018  - continue Insulin Aspart and Lantus

## 2019-01-09 NOTE — Telephone Encounter (Signed)
[employer] to ship a 30 day cardiac event monitor to the patients home.  Instructions reviewed briefly as they are included in the monitor kit. 

## 2019-01-09 NOTE — Patient Outreach (Addendum)
[facility]) Care Management  01/09/2019  FLEURETTE WOOLBRIGHT March 29, 1924 [id number]    Telephone Assessment  RN spoke briefly with pt today however not a convenient time to talk. RN offered to follow up later today (pt receptive)  Will follow up later today concerning pt's management of care.   [name], RN Care Management Coordinator [place] Office [phone]

## 2019-01-09 NOTE — Patient Outreach (Signed)
[facility]) Care Management  01/09/2019  CANDITA BORENSTEIN 1923-10-30 [id number]    Telephone Assessment-Unsuccessful  RN spoke with pt earlier to follow up this afternoon however unable to reach pt and unable to leave a voice message to request a call back. RN will outreach to pt next week and attempt to receive an update accordingly.    [name], RN Care Management Coordinator [place] [facility] [phone]

## 2019-01-09 NOTE — Progress Notes (Signed)
This service is provided via telemedicine  No vital signs collected/recorded due to the encounter was a telemedicine visit.   Location of patient (ex: home, work):  Home  Patient consents to a telephone visit: Yes  Location of the provider (ex: office, home):  Office  Name of any referring provider:  Abbey Chatters  Names of all persons participating in the telemedicine service and their role in the encounter:  [name] Armstrong(daughter) Salem Caster RMA, Avanell Shackleton Medina-Vargas NP  Time spent on call:  10 minutes      DATE:  01/09/2019 MRN:  [id number]  BIRTHDAY: 04-Nov-1923   Contact Information    Name Relation Home Work Mobile   Armstrong,Shannon [name] Daughter [phone]  [phone]   Bogdon,John Son [phone]     Tonia Ghent [phone]  [phone]   Asano,William Son [phone]  [phone]       Code Status History    Date Active Date Inactive Code Status Order ID Comments User Context   12/25/2018 1853 12/26/2018 1942 Partial Code [id number]  Jerald Kief, MD ED   12/25/2018 1843 12/25/2018 1853 Full Code [id number]  Jerald Kief, MD ED   11/24/2018 2352 11/26/2018 1821 DNR [id number]  Charlsie Quest, MD Inpatient   05/03/2018 1535 05/09/2018 1920 Full Code [id number]  Carron Curie, MD Inpatient   06/27/2017 1507 06/29/2017 1553 Full Code [id number]  Maeola Harman, MD Inpatient   02/20/2017 0434 02/23/2017 1956 DNR [id number]  Briscoe Deutscher, MD ED   09/15/2016 0035 09/27/2016 1742 DNR [id number]  Hillary Bow, DO ED   08/12/2016 1706 08/18/2016 2132 DNR [id number]  Dorothea Ogle, MD Inpatient   07/28/2016 2325 08/01/2016 2110 DNR [id number]  Michael Litter, MD ED   06/08/2016 1752 06/16/2016 1805 DNR [id number]  Tobey Grim, NP ED   05/22/2016 0456 05/30/2016 1738 DNR [id number]  Clydie Braun, MD Inpatient   05/09/2016 1049 05/17/2016 1728 DNR [id number]  Maxie Barb, MD Inpatient   05/08/2016 1759 05/09/2016 1049 Full Code [id number]   Maxie Barb, MD ED   03/02/2016 1755 03/14/2016 2016 Partial Code [id number]  Ellsworth Lennox, PA Inpatient   02/08/2016 0217 02/12/2016 1746 Partial Code [id number]  Clydie Braun, MD ED   01/24/2016 1621 02/02/2016 2056 Partial Code [id number]  Laurann Montana, PA-C Inpatient   01/24/2016 1611 01/24/2016 1621 Partial Code [id number]  Dyann Kief, PA-C Inpatient   01/24/2016 1611 01/24/2016 1611 Partial Code [id number]  Dyann Kief, PA-C Inpatient   11/06/2015 2300 11/11/2015 2201 Partial Code [id number]  Alberteen Sam, MD Inpatient   09/12/2015 0411 09/30/2015 1719 Partial Code [id number]  Briscoe Deutscher, MD Inpatient   08/26/2015 1918 09/05/2015 1824 Partial Code [id number]  Meredith Pel, NP Inpatient   08/26/2015 1527 08/26/2015 1918 Full Code [id number]  Meredith Pel, NP ED   08/02/2015 1603 08/05/2015 1731 Partial Code [id number]  Abelino Derrick, PA-C ED   07/06/2015 1144 07/11/2015 1636 Partial Code [id number]  Minor, Vilinda Blanks, NP Inpatient   07/05/2015 2118 07/06/2015 1144 Full Code [id number]  Hillary Bow, DO ED   01/28/2015 0047 01/29/2015 1732 DNR [id number]  Rolly Salter, MD Inpatient   01/14/2015 1409 01/27/2015 2148 DNR [id number]  Katheran Awe, NP Inpatient   01/10/2015 0501 01/14/2015 1409 Full Code [id number]  Hillary Bow, DO ED   05/21/2014 2324 05/30/2014 1829 DNR [id number]  Hillary Bow, DO ED   03/27/2014 1733 03/30/2014 1826 DNR [id number]  Elgergawy, Mliss Fritz,  MD Inpatient   03/13/2014 0226 03/26/2014 1631 Full Code [id number]  Eduard Clos, MD Inpatient   01/18/2014 0150 01/22/2014 1708 Full Code [id number]  Pearson Grippe, MD Inpatient   12/23/2013 2311 12/26/2013 2218 Full Code [id number]  Haydee Monica, MD Inpatient   Advance Care Planning Activity    Questions for Most Recent Historical Code Status (Order [id number])    Question Answer Comment   In the event of cardiac or respiratory ARREST: Initiate Code Blue, Call Rapid Response Yes    In the  event of cardiac or respiratory ARREST: Perform CPR Yes    In the event of cardiac or respiratory ARREST: Perform Intubation/Mechanical Ventilation No    In the event of cardiac or respiratory ARREST: Use NIPPV/BiPAp only if indicated Yes    In the event of cardiac or respiratory ARREST: Administer ACLS medications if indicated Yes    In the event of cardiac or respiratory ARREST: Perform Defibrillation or Cardioversion if indicated No        Chief Complaint  Patient presents with  . Acute Visit    follow-up hospitalization    HISTORY OF PRESENT ILLNESS:  This is an 85 year old female who was recently discharged from the hospital due to bright red blood per rectum in setting of constipation and thought to be due to hemorrhoidal bleed. Her hgb remained stable at 10.1 and advised to continue Coumadin as risk to anticoagulation outweighs benefit to holding. She has atrial fibrillation and case was discussed with cardiologist on-call who recommended to continue Coumadin if there is no significant change in hgb overnight.  Patient and daughter, [name], on the phone for a televisit. Daughter reported that she had not noticed any blood in her stool. She has good appetite. Last INR 2.0 (9/25). Her next INR is due on 10/23 according to daughter. Patient is busy knitting all the time. She would like to cut dosage of her Zoloft.  PAST MEDICAL HISTORY:  Past Medical History:  Diagnosis Date  . Acute respiratory failure (HCC)   . Anemia    previous blood transfusions  . Arthritis    "all over"  . Asthma   . Atrial flutter (HCC)   . Bradycardia    requiring previous d/c of BB and reduction of amiodarone  . CAD (coronary artery disease)    nonobstructive per notes  . Chronic diastolic CHF (congestive heart failure) (HCC)   . CKD (chronic kidney disease), stage III (HCC)   . Complication of blood transfusion    "got the wrong blood type at [facility] in ~ 2015; no adverse reaction that we are aware  of"/daughter, [name] (01/27/2016)  . COPD (chronic obstructive pulmonary disease) (HCC)   . Depression    "light case"  . DVT (deep venous thrombosis) (HCC) 01/2016   a. LLE DVT 01/2016 - switched from Eliquis to Coumadin.  [name] Dyspnea    with some activity  . Gastric stenosis    a. s/p stomach tube  . GERD (gastroesophageal reflux disease)   . History of blood transfusion    "several" (01/27/2016)  . History of stomach ulcers   . Hyperlipidemia   . Hypertension   . Hypothyroidism   . On home oxygen therapy    "2L; 9PM til 9AM" (06/27/2017)  . PAD (peripheral artery disease) (HCC)    a. prior gangrene, toe amputations, intervention.  [name] PAF (paroxysmal atrial fibrillation) (HCC)   . Paraesophageal hernia   . Perforated gastric ulcer (HCC)   .  Pneumonia    "a few times" (06/27/2017)  . Seasonal allergies   . SIADH (syndrome of inappropriate ADH production) (HCC)    [name] 01/10/2015  . Small bowel obstruction (HCC)    "I don't know how many" (01/11/2015)  . Stroke [facility])    several- no residual  . Type II diabetes mellitus (HCC)    "related to prednisone use  for > 20 yr; once predinose stopped; no more DM RX" (01/27/2016)  . UTI (urinary tract infection) 02/08/2016  . Ventral hernia with bowel obstruction      CURRENT MEDICATIONS: Reviewed  Patient's Medications  New Prescriptions   SERTRALINE (ZOLOFT) 50 MG TABLET    Take 1 tablet (50 mg total) by mouth daily.  Previous Medications   ACETAMINOPHEN (TYLENOL) 500 MG TABLET    Take 500 mg by mouth every 6 (six) hours as needed (pain).    AMINO ACIDS-PROTEIN HYDROLYS (FEEDING SUPPLEMENT, PRO-STAT SUGAR FREE 64,) LIQD    Take 30 mLs by mouth 2 (two) times daily.   AMLODIPINE (NORVASC) 5 MG TABLET    Take 1 tablet (5 mg total) by mouth daily.   ATORVASTATIN (LIPITOR) 10 MG TABLET    Take 1 tablet (10 mg total) by mouth daily.   B COMPLEX VITAMINS TABLET    Take 1 tablet by mouth daily.    CALCIUM-VITAMIN D (OSCAL WITH D) 500-200  MG-UNIT PER TABLET    Take 1 tablet by mouth daily with breakfast.    CARBOXYMETHYLCELLULOSE SODIUM (ARTIFICIAL TEARS OP)    Place 1 drop into both eyes 4 (four) times daily.   COLLAGENASE (SANTYL) OINTMENT    Apply 1 application topically daily as needed (wound care).    CYCLOBENZAPRINE (FLEXERIL) 5 MG TABLET    Take 1 tablet (5 mg total) by mouth 3 (three) times daily as needed for muscle spasms.   CYCLOSPORINE (RESTASIS) 0.05 % OPHTHALMIC EMULSION    USE 1 DROP INTO BOTH EYES TWICE DAILY   FEEDING SUPPLEMENT (OSMOLITE 1 CAL) LIQD    Take 711 mLs by mouth See admin instructions. For 12 hours 2100 - 0900   FERROUS SULFATE 325 (65 FE) MG TABLET    TAKE 1 TABLET BY MOUTH EVERY DAY   FLUTICASONE (FLONASE) 50 MCG/ACT NASAL SPRAY    SPRAY 2 SPRAYS INTO EACH NOSTRIL EVERY DAY   FUROSEMIDE (LASIX) 40 MG TABLET    Take 0.5 tablets (20 mg total) by mouth daily. May take an extra 1/2 tablet as needed for swelling, weight gain, or shortness of breath   GABAPENTIN (NEURONTIN) 300 MG CAPSULE    TAKE 1 CAPSULE BY MOUTH TWICE A DAY   GENTAMICIN CREAM (GARAMYCIN) 0.1 %    Apply 1 application topically 3 (three) times daily as needed (dry skin).    HYDRALAZINE (APRESOLINE) 50 MG TABLET    TAKE 1 TABLET BY MOUTH THREE TIMES A DAY   INSULIN ASPART (NOVOLOG) 100 UNIT/ML FLEXPEN    Inject 0-6 Units into the skin 3 (three) times daily with meals. New sliding Scale If BS is 151 - 251 = 2 units,  If BS is 251 -  350 = 4 units,  If BS is 351 - 450 = 6 units Call provider if BS runs more than 400   INSULIN GLARGINE (LANTUS) 100 UNIT/ML INJECTION    Inject 8 Units into the skin at bedtime.    INSULIN PEN NEEDLE (B-D UF III MINI PEN NEEDLES) 31G X 5 MM MISC    Use  twice daily with giving insulin injections. Dx E11.9   IPRATROPIUM-ALBUTEROL (DUONEB) 0.5-2.5 (3) MG/3ML SOLN    Take 3 mLs by nebulization every 6 (six) hours as needed (sob). Use 3 times daily x 4 days then every 6 hours as needed.   ISOSORBIDE MONONITRATE (IMDUR)  30 MG 24 HR TABLET    TAKE 2 TABLETS BY MOUTH DAILY. PLEASE MAKE ANNUAL APPT WITH DR. Delton See FOR FUTURE REFILLS. THANK YOU. 1ST ATTEMPT.   LATANOPROST (XALATAN) 0.005 % OPHTHALMIC SOLUTION    Place 1 drop into both eyes at bedtime.   LEVALBUTEROL (XOPENEX HFA) 45 MCG/ACT INHALER    Inhale 2 puffs into the lungs every 6 (six) hours as needed for wheezing or shortness of breath.   LEVOTHYROXINE (SYNTHROID, LEVOTHROID) 125 MCG TABLET    Take 1 tablet (125 mcg total) by mouth daily.   LORATADINE (CLARITIN) 10 MG TABLET    Take 10 mg by mouth daily as needed for allergies.   MOMETASONE-FORMOTEROL (DULERA) 200-5 MCG/ACT AERO    TAKE TWO PUFFS BY MOUTH TWICE DAILY   MULTIPLE VITAMIN (MULTIVITAMIN WITH MINERALS) TABS TABLET    Take 1 tablet by mouth daily.   NYSTATIN (MYCOSTATIN) 100000 UNIT/ML SUSPENSION    Take 5 mLs (500,000 Units total) by mouth 4 (four) times daily.   NYSTATIN (MYCOSTATIN/NYSTOP) POWDER    Apply 1 g topically as needed (skin irritation).    OXYCODONE (ROXICODONE) 5 MG IMMEDIATE RELEASE TABLET    Take 1 tablet (5 mg total) by mouth every 4 (four) hours as needed for severe pain. May take 1/2 tab q4hrs as needed for mild pain.   OXYGEN    Inhale 2 L into the lungs at bedtime.   PANTOPRAZOLE (PROTONIX) 40 MG TABLET    TAKE 1 TABLET BY MOUTH TWICE A DAY   WARFARIN (COUMADIN) 2.5 MG TABLET    TAKE AS DIRECTED BY COUMADIN CLINIC.   WATER FOR IRRIGATION, STERILE (FREE WATER) SOLN    Place 150 mLs into feeding tube 3 (three) times daily.    WHITE PETROLATUM-MINERAL OIL (SYSTANE NIGHTTIME) OINT    Place 1 application into both eyes at bedtime.   Modified Medications   No medications on file  Discontinued Medications   SERTRALINE (ZOLOFT) 100 MG TABLET    TAKE 1 TABLET BY MOUTH EVERY DAY FOR DEPRESSION     Allergies  Allergen Reactions  . Penicillins Itching, Rash and Other (See Comments)    Tolerated amoxicillin, unasyn, zosyn & cephalosporins in the past. Did it involve swelling of  the face/tongue/throat, SOB, or low BP? No Did it involve sudden or severe rash/hives, skin peeling, or any reaction on the inside of your mouth or nose? No Did you need to seek medical attention at a hospital or doctor's office? Unknown When did it last happen?5+ years If all above answers are "NO", may proceed with cephalosporin use.      REVIEW OF SYSTEMS:  GENERAL: no change in appetite, no fatigue, no weight changes, no fever, chills or weakness SKIN: Denies rash, itching, wounds, ulcer sores, or nail abnormality MOUTH and THROAT: Denies oral discomfort, gingival pain or bleeding RESPIRATORY: no cough, SOB, DOE, wheezing, hemoptysis CARDIAC: no chest pain, edema or palpitations GI: no abdominal pain, diarrhea, constipation, heart burn, nausea or vomiting GU: Denies dysuria, frequency, hematuria or discharge NEUROLOGICAL: Denies dizziness, syncope, numbness, or headache PSYCHIATRIC: Denies feeling of depression or anxiety. No report of hallucinations, insomnia, paranoia, or agitation    LABS/RADIOLOGY: Labs reviewed: Basic  Metabolic Panel: Recent Labs    12/25/18 1518 12/26/18 0533 01/01/19 1404  NA 137 138 137  K 4.1 4.0 4.8  CL 101 106 96  CO2 27 23 26   GLUCOSE 87 83 94  BUN 61* 52* 69*  CREATININE 1.48* 1.28* 1.61*  CALCIUM 9.7 9.4 9.7   Liver Function Tests: Recent Labs    11/24/18 2036 12/25/18 1518 12/26/18 0533  AST 32 27 24  ALT 20 20 19   ALKPHOS 71 87 84  BILITOT 0.5 0.5 0.6  PROT 7.6 7.9 7.4  ALBUMIN 3.8 3.8 3.5   Recent Labs    08/27/18 1100  LIPASE 24  AMYLASE 60   CBC: Recent Labs    10/15/18 1122 11/24/18 2036  12/02/18 1555 12/25/18 1518 12/26/18 0533 01/01/19 1404  WBC 4.7 6.5   < > 5.7 7.8 5.4 6.0  NEUTROABS 2,797 4.2  --  3,631  --   --   --   HGB 10.3* 9.6*   < > 9.6* 10.1* 9.8* 9.6*  HCT 31.5* 29.7*   < > 29.2* 30.9* 30.0* 30.0*  MCV 93.8 97.4   < > 94.8 94.5 95.8 93  PLT 209 201   < > 225 264 225 243   < > =  values in this interval not displayed.   A1C: Invalid input(s): A1C Lipid Panel: Recent Labs    10/15/18 1122  HDL 47*   CBG: Recent Labs    12/26/18 0110 12/26/18 0735 12/26/18 1130  GLUCAP 77 79 99      Ct Abdomen Pelvis W Contrast  Result Date: 12/25/2018 CLINICAL DATA:  Acute abdominal pain and rectal pain. Blood in stools for 1 week. EXAM: CT ABDOMEN AND PELVIS WITH CONTRAST TECHNIQUE: Multidetector CT imaging of the abdomen and pelvis was performed using the standard protocol following bolus administration of intravenous contrast. CONTRAST:  80mL OMNIPAQUE IOHEXOL 300 MG/ML  SOLN COMPARISON:  CT scan of the abdomen dated 06/06/2017 and abdominal radiographs dated 12/25/2018 FINDINGS: Lower chest: No acute abnormality. Aortic atherosclerosis. Hepatobiliary: Multiple small hepatic cysts, unchanged. Cholecystectomy. No dilated bile ducts. Pancreas: Unremarkable. No pancreatic ductal dilatation or surrounding inflammatory changes. Spleen: Normal in size without focal abnormality. Adrenals/Urinary Tract: Adrenal glands and kidneys are normal except for a 12 mm cyst on the lower pole of the left kidney. No hydronephrosis. Bladder is normal. Stomach/Bowel: There are diverticula throughout the colon. Gastrostomy tube in place. The tip of the tube is in the proximal jejunum in the left upper quadrant. No dilated bowel. Appendix is not visualized. Surgical staple lines noted in several areas of the bowel. Vascular/Lymphatic: Aortic atherosclerosis. No enlarged abdominal or pelvic lymph nodes. Reproductive: Uterus and bilateral adnexa are unremarkable. Other: There are periumbilical hernias to the right and left of midline. The hernia to the right contains large and small bowel loops without evidence of obstruction. The hernia to the left of midline contains only peritoneal fat. No significant change since the prior study. Musculoskeletal: No acute abnormalities. Degenerative disc and joint disease  in the lower lumbar spine. Old mild compression fracture of T11. IMPRESSION: 1. No acute abnormalities of the abdomen or pelvis. 2. Diverticulosis of the colon. 3. Periumbilical hernias to the right and left of midline containing large and small bowel without evidence of obstruction. 4. Aortic atherosclerosis. Aortic Atherosclerosis (ICD10-I70.0). Electronically Signed   By: Francene Boyers M.D.   On: 12/25/2018 16:38   Dg Abdomen Acute W/chest  Result Date: 12/25/2018 CLINICAL DATA:  Constipation EXAM: DG  ABDOMEN ACUTE W/ 1V CHEST COMPARISON:  June 06, 2017 FINDINGS: There is elevation of the right hemidiaphragm. There is no pneumothorax. No large pleural effusion. No focal infiltrate. Aortic calcifications are noted. The bowel gas pattern is nonobstructive. A gastrojejunostomy is noted. The tube appears well position. Surgical staples are noted in the left lower quadrant. There are degenerative changes throughout the visualized portions of the thoracolumbar spine. IMPRESSION: 1. No evidence of acute cardiopulmonary process. 2. Nonobstructive bowel gas pattern. 3. Stable elevation of the right hemidiaphragm. 4. Gastrojejunostomy tube appears well position. Electronically Signed   By: Katherine Mantle M.D.   On: 12/25/2018 16:02   Vas US Carotid  Result Date: 01/08/2019 Carotid Arterial Duplex Study Indications:       Carotid artery disease and follow up. Patient complains of                    left neck and skull aching. She denies any headaches or other                    cerebrovascular symptoms. Risk Factors:      Hypertension, Diabetes, no history of smoking, PAD. Other Factors:     Former user of smokeless tobacco. Limitations        Today's exam was limited due to the patient's respiratory                    variation and patient constantly swallowing and unable to                    keep still. Comparison Study:  Prior carotid duplex exam on 12/25/2013 showed velocities sin                    right  proximal ICA 90/23 cm/s and left proximal ICA 54/12                    cm/s. Performing Technologist: [name] RVT, RDCS (AE), RDMS  Examination Guidelines: A complete evaluation includes B-mode imaging, spectral Doppler, color Doppler, and power Doppler as needed of all accessible portions of each vessel. Bilateral testing is considered an integral part of a complete examination. Limited examinations for reoccurring indications may be performed as noted.  Right Carotid Findings: +----------+-------+-------+--------+---------------------------------+--------+           PSV    EDV    StenosisPlaque Description               Comments           cm/s   cm/s                                                     +----------+-------+-------+--------+---------------------------------+--------+ CCA Prox  119    14                                              tortuous +----------+-------+-------+--------+---------------------------------+--------+ CCA Mid   97     8      <50%    heterogenous, irregular and  calcific                                  +----------+-------+-------+--------+---------------------------------+--------+ CCA Distal84     12     <50%    heterogenous, irregular and                                               calcific                                  +----------+-------+-------+--------+---------------------------------+--------+ ICA Prox  115    23     1-39%   heterogenous, irregular and                                               calcific                                  +----------+-------+-------+--------+---------------------------------+--------+ ICA Mid   102    21                                                       +----------+-------+-------+--------+---------------------------------+--------+ ICA Distal96     19                                                        +----------+-------+-------+--------+---------------------------------+--------+ ECA       98     6              heterogenous and calcific                 +----------+-------+-------+--------+---------------------------------+--------+ +----------+--------+-------+----------------+-------------------+           PSV cm/sEDV cmsDescribe        Arm Pressure (mmHG) +----------+--------+-------+----------------+-------------------+ [id number]            Multiphasic, [id number]                 +----------+--------+-------+----------------+-------------------+ +---------+--------+--+--------+--+---------+ VertebralPSV cm/s88EDV cm/s17Antegrade +---------+--------+--+--------+--+---------+  Left Carotid Findings: +----------+-------+-------+--------+---------------------------------+--------+           PSV    EDV    StenosisPlaque Description               Comments           cm/s   cm/s                                                     +----------+-------+-------+--------+---------------------------------+--------+ CCA Prox  106    11                                                       +----------+-------+-------+--------+---------------------------------+--------+  CCA Mid                 <50%    calcific                                  +----------+-------+-------+--------+---------------------------------+--------+ CCA Distal85     11     <50%    calcific                                  +----------+-------+-------+--------+---------------------------------+--------+ ICA Prox  88     22             heterogenous, irregular and                                               calcific                                  +----------+-------+-------+--------+---------------------------------+--------+ ICA Mid   110    23     1-39%                                              +----------+-------+-------+--------+---------------------------------+--------+ ICA Distal100    22                                                       +----------+-------+-------+--------+---------------------------------+--------+ ECA       68     18                                                       +----------+-------+-------+--------+---------------------------------+--------+ +----------+--------+--------+----------------+-------------------+           PSV cm/sEDV cm/sDescribe        Arm Pressure (mmHG) +----------+--------+--------+----------------+-------------------+ [id number]             Multiphasic, [id number]                 +----------+--------+--------+----------------+-------------------+ +---------+--------+--+--------+--+---------+ VertebralPSV cm/s65EDV cm/s14Antegrade +---------+--------+--+--------+--+---------+  Summary: Right Carotid: Velocities in the right ICA are consistent with a 1-39% stenosis.                Non-hemodynamically significant plaque <50% noted in the CCA. Left Carotid: Velocities in the left ICA are consistent with a 1-39% stenosis.               Non-hemodynamically significant plaque <50% noted in the CCA. Vertebrals:  Bilateral vertebral arteries demonstrate antegrade flow. Subclavians: Normal flow hemodynamics were seen in bilateral subclavian              arteries. *See table(s) above for measurements and observations.  Electronically signed by Tobias Alexander MD on 01/08/2019 at 1:49:05 PM.    Final     ASSESSMENT/PLAN:   1. Depression, unspecified  depression type - requested to decrease dosage of Zoloft and eventually gets off medication - decrease Zoloft from 100 mg to 50 mg daily - sertraline (ZOLOFT) 50 MG tablet; Take 1 tablet (50 mg total) by mouth daily.  Dispense: 30 tablet; Refill: 3  2. Atrial fibrillation and flutter (HCC) - rate-controlled, cardiologist recommended to continue Coumadin  3. BRBPR (bright red  blood per rectum) - hgb was stable, thought to be from constipation/hemmorrhoidal bleed  4. Essential hypertension, benign - reported BP this morning 135/60  - continue Amlodipine  5. Warfarin anticoagulation - will have next INR on 10/23, cardiologist recommended to continue Coumadin  6. Other specified hypothyroidism Lab Results  Component Value Date   TSH 0.98 10/15/2018  - continue Levothyroxine  7. Type 2 diabetes mellitus with chronic kidney disease (HCC) Lab Results  Component Value Date   HGBA1C 6.3 (H) 10/15/2018  - continue Insulin Aspart and Lantus     Time spent on non face to face visit:  19 minutes  The patient gave consent to this telephone visit. Explained to the patient the risk and privacy issue that was involved with this telephone call.   The patient was advised to call back and ask for an in-person evaluation if the symptoms worsen or if the condition fails to improve.   Kenard Gower, NP   [employer] [phone]

## 2019-01-10 DIAGNOSIS — K625 Hemorrhage of anus and rectum: Secondary | ICD-10-CM | POA: Insufficient documentation

## 2019-01-12 ENCOUNTER — Other Ambulatory Visit: Payer: Self-pay | Admitting: Nurse Practitioner

## 2019-01-13 ENCOUNTER — Other Ambulatory Visit: Payer: Self-pay | Admitting: *Deleted

## 2019-01-13 IMAGING — CR DG CHEST 2V
2 series · 2 of 2 positions shown · non-contrast
Comparison: 06/18/2016 and prior chest radiograph

CLINICAL DATA: Shortness of breath and cough today.

EXAM:
CHEST  2 VIEW

[w chest lat]
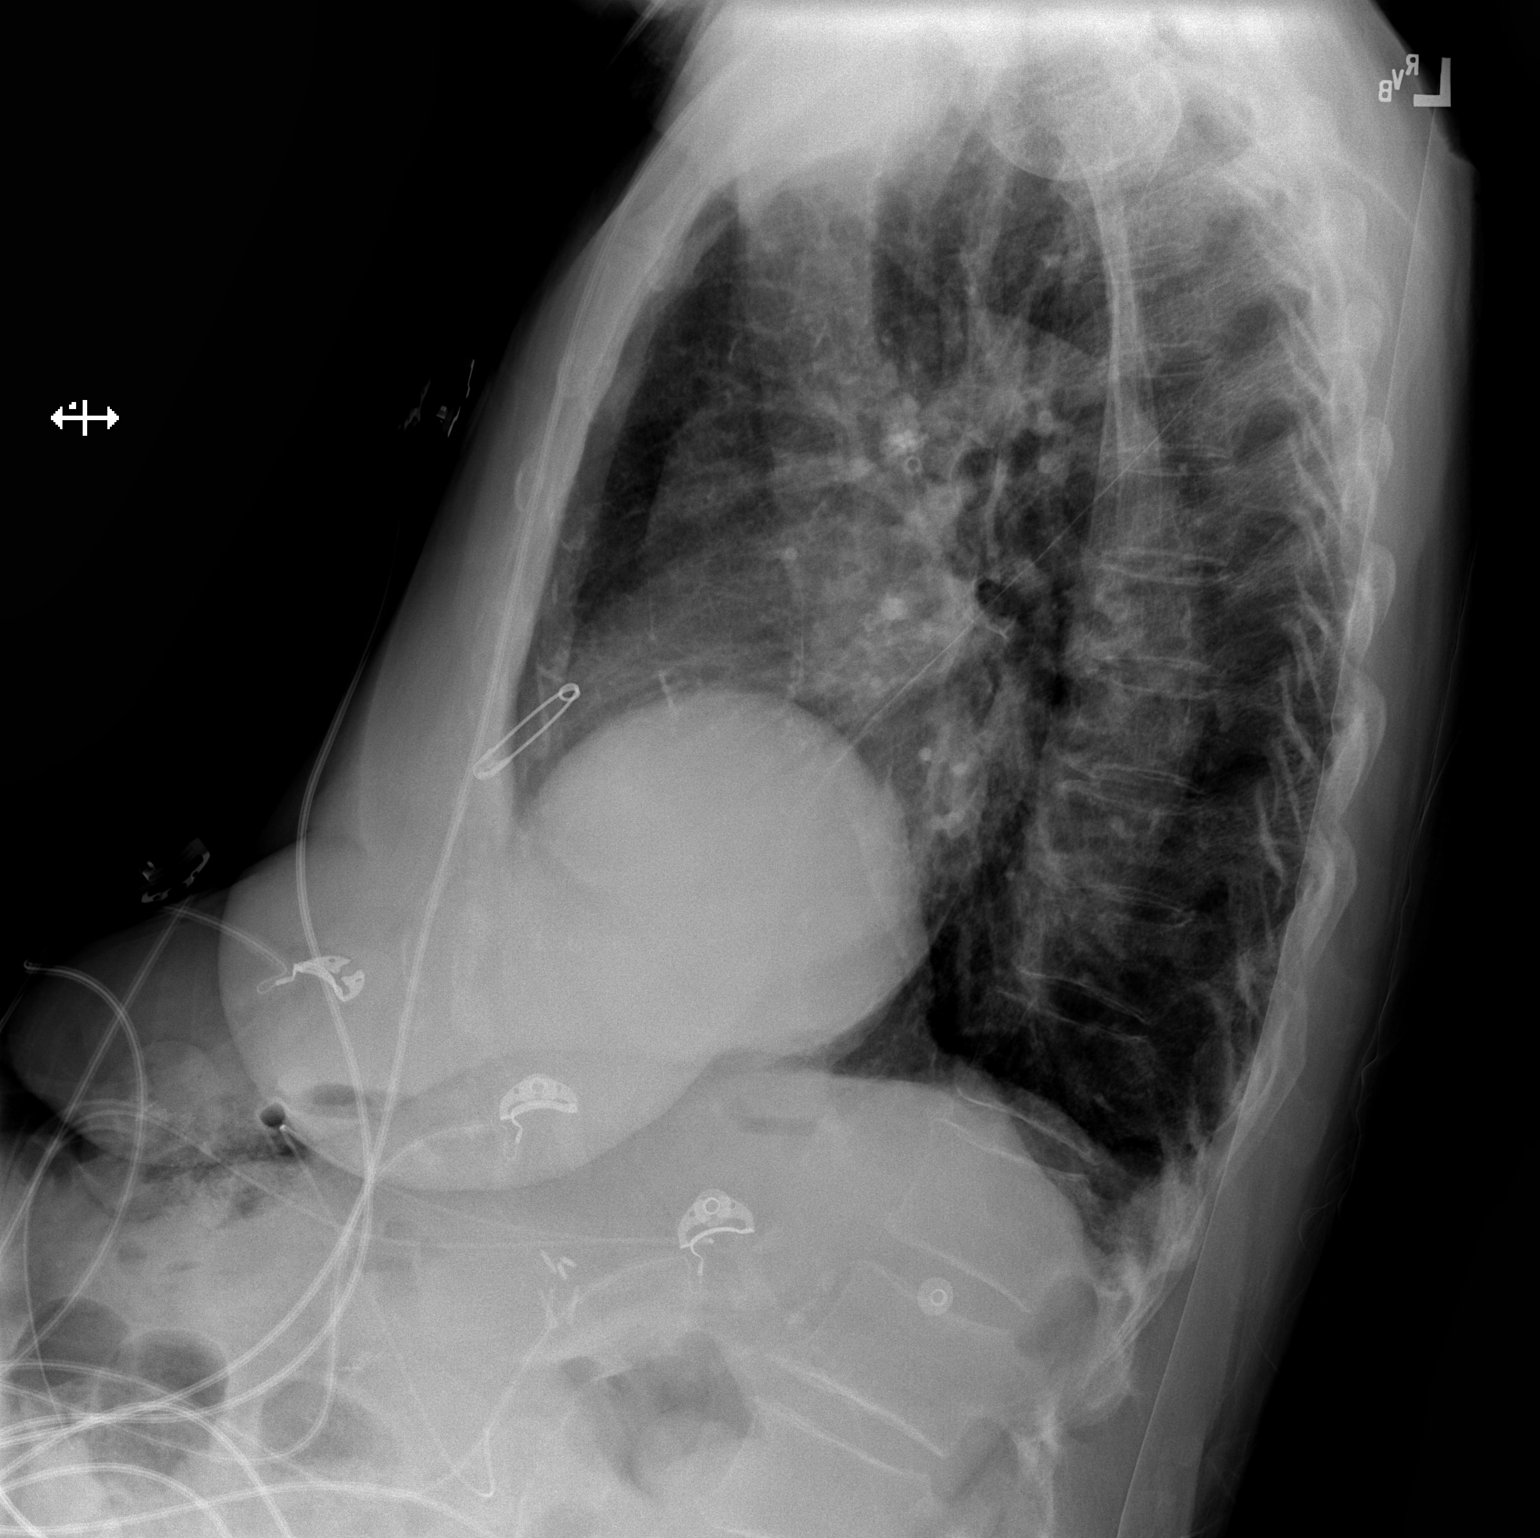

[x chest ap]
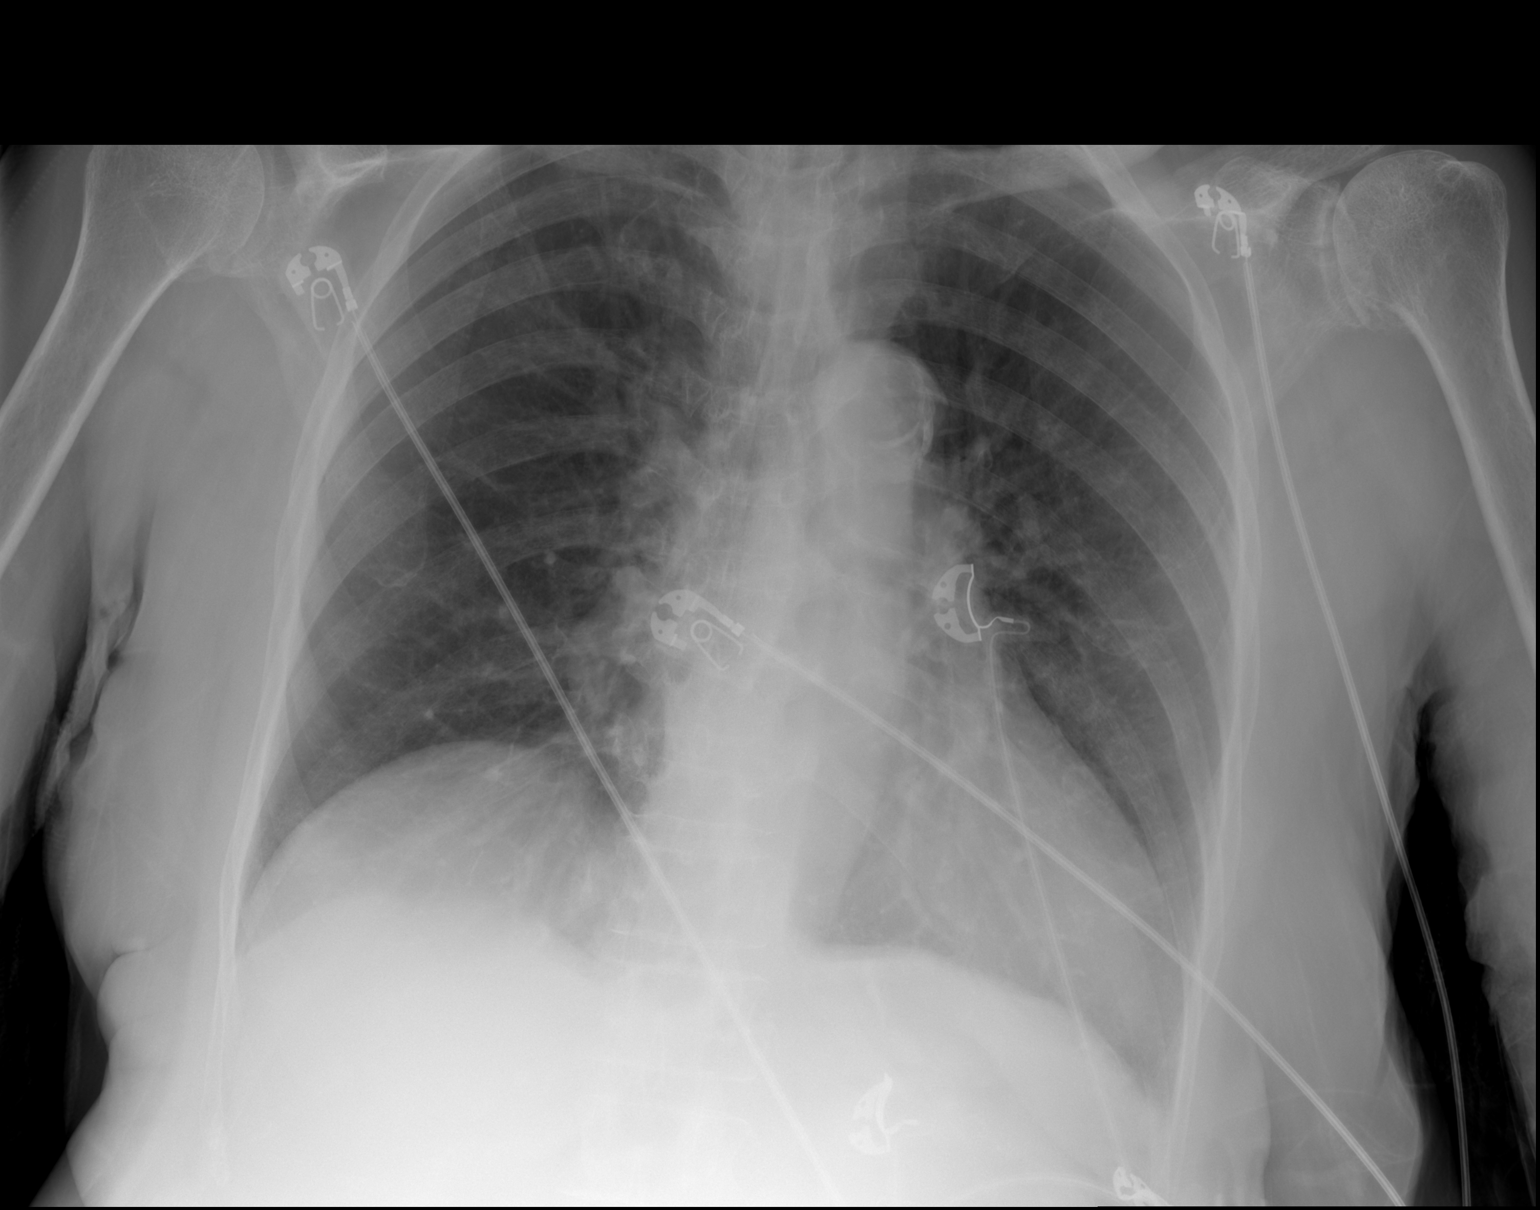

[2 of 2 positions shown; findings below may reference images not displayed]

FINDINGS: Cardiomegaly and mild pulmonary vascular congestion noted.

Elevation of the right hemidiaphragm is again identified.

There is no evidence of focal airspace disease, pulmonary edema,
suspicious pulmonary nodule/mass, pleural effusion, or pneumothorax.
No acute bony abnormalities are identified.
IMPRESSION: Cardiomegaly with mild pulmonary vascular congestion.

## 2019-01-13 NOTE — Patient Outreach (Signed)
[facility]) Care Management  01/13/2019  Shannon [name] 25-Mar-1924 [id number]    RN attempted another outreach and able to speak with pt's daughter Shannon [name]). States pt currently in the bathroom and awaiting her breakfast. RN offered to follow up once again today after 12:00 pm (receptive).   Will follow up later today for updated assessment. Will further engage at that time.  [name], RN Care Management Coordinator [place] Office [phone]

## 2019-01-13 NOTE — Patient Outreach (Signed)
[facility]) Care Management  01/13/2019  Shannon [name] [birth year] [id number]    Telephone Assessment-HF  RN spoke with pt today and received an update on pt's ongoing management of care. Pt reports she is doing well with no additional bleed from the recent ED visit. States she believe it was hemorrhoids. Rn inquired further on her ongoing HF management. Pt reports her weights over the few days at 140-141 lbs with no weight gained over the past week. Pt denies any swelling and no HF related symptoms ([place]). States she has enough medications and refills and continue to receive transportation to her medical appointments from her daughter Shannon [name]).   Plan of care discussed and updated accordingly. RN discussed weaning to a health coach based upon her ongoing management of care (receptive). Will continue to encouraged adherence with the goals of care that will remain in place as pt continue to management her care. Discussed quarterly follow up call as pt hesitate but agreed to ongoing [facility] services. Will make a referral to a health coach and update the current plan of care with adjusted interventions accordingly.  [name] CM Care Plan Problem One     Most Recent Value  Care Plan Problem One  Knowledge deficit related to HF  Role Documenting the Problem One  Care Management Coordinator  Care Plan for Problem One  Active  [name] Long Term Goal   Pt/fx will have an increased knowledge of HF management and what to do if acute symptoms should occurr with the next 90 days.  [name] Long Term Goal Start Date  08/07/18  Interventions for Problem One Long Term Goal  Will review the plan of care and update the intervention as pt continue to do well in monitoring this condition. Will reiterate on the signs and symptoms and encourage pt on early interventions to prevent acute encounters from occurring.         Shannon Mina, RN Care Management Coordinator [place] Office  [phone]

## 2019-01-15 ENCOUNTER — Other Ambulatory Visit: Payer: Medicare Other | Admitting: *Deleted

## 2019-01-15 ENCOUNTER — Other Ambulatory Visit: Payer: Self-pay

## 2019-01-15 DIAGNOSIS — N183 Chronic kidney disease, stage 3 unspecified: Secondary | ICD-10-CM

## 2019-01-15 IMAGING — DX DG CHEST 1V PORT
1 series · 1 of 1 positions shown · non-contrast
Comparison: 08/12/2016.

CLINICAL DATA: Shortness of breath.  Cough.

EXAM:
PORTABLE CHEST 1 VIEW

[chest ap]
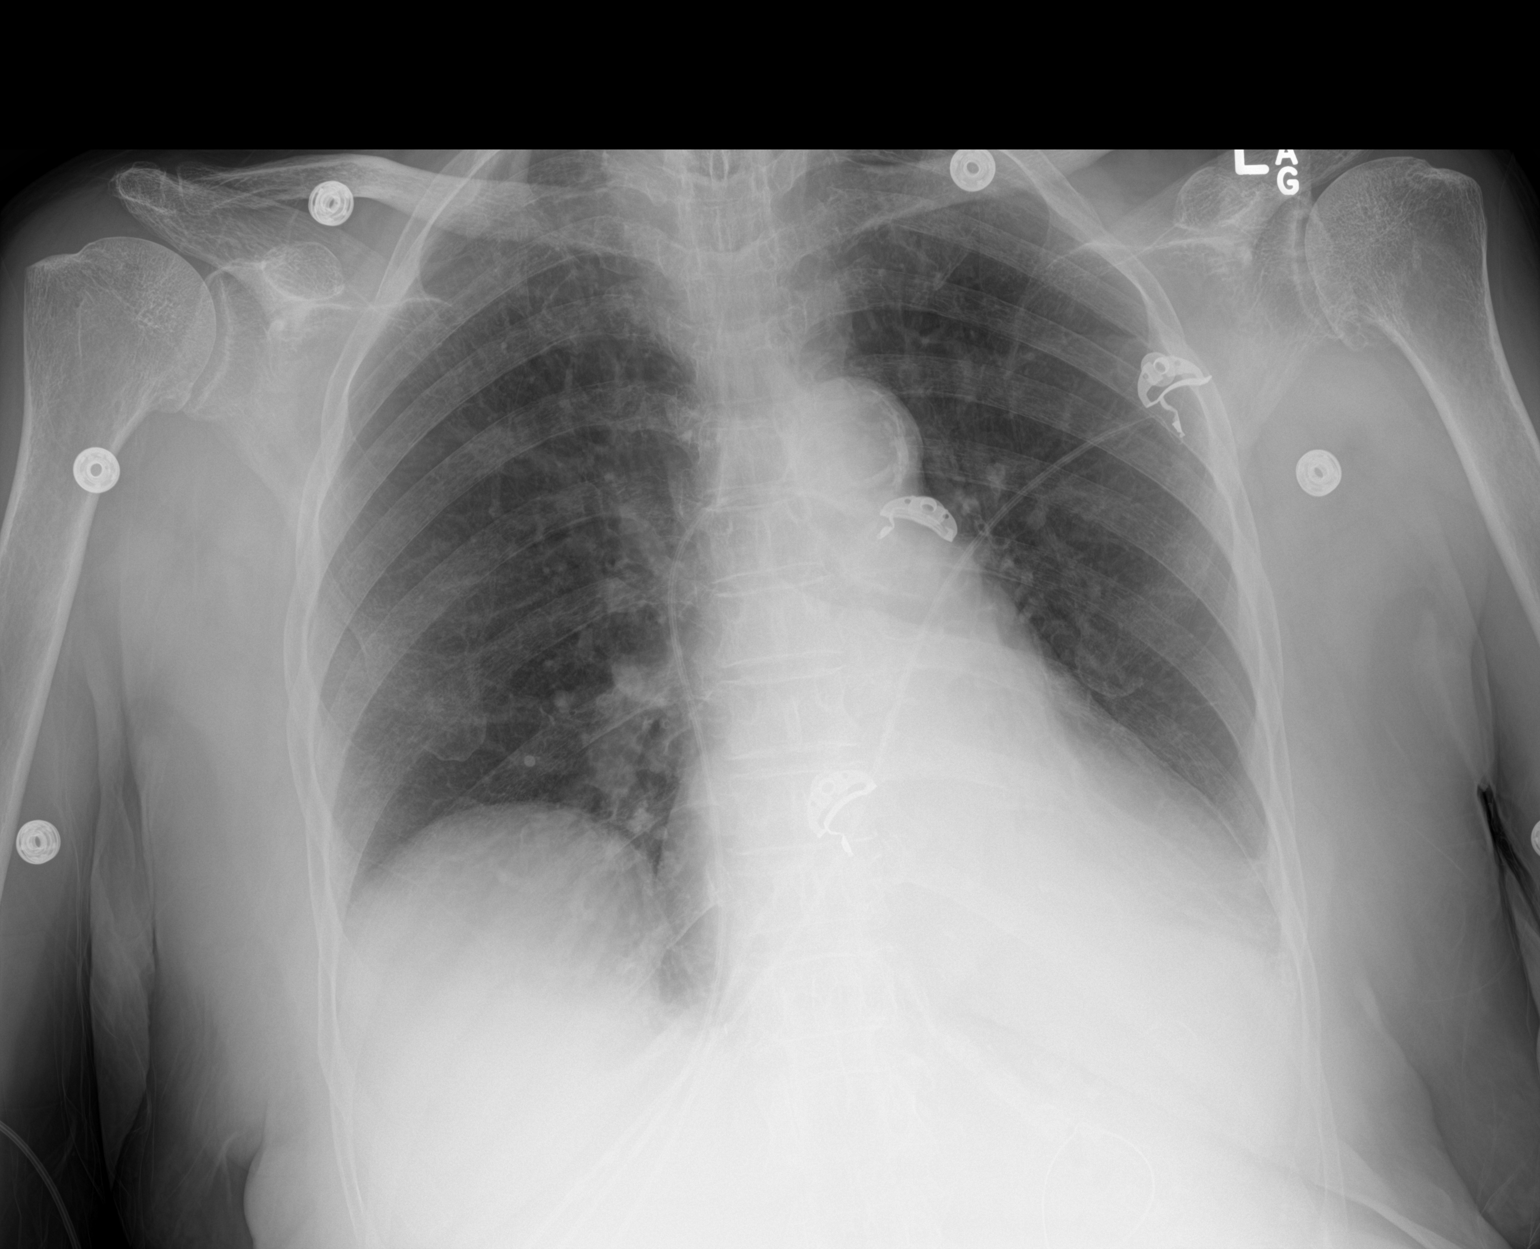

[1 of 1 positions shown; findings below may reference images not displayed]

FINDINGS: Mediastinum hilar structures are normal. Cardiomegaly. Low lung
volumes with bibasilar atelectasis. Left base infiltrate cannot be
excluded. Tiny left pleural effusion cannot be excluded. No
pneumothorax.
IMPRESSION: 1. Low lung volumes with bibasilar atelectasis. Left base infiltrate
cannot be excluded. Small left pleural effusion cannot be excluded.

2. Stable cardiomegaly.

## 2019-01-16 LAB — BASIC METABOLIC PANEL
BUN/Creatinine Ratio: 41 — ABNORMAL HIGH (ref 12–28)
BUN: 58 mg/dL — ABNORMAL HIGH (ref 10–36)
CO2: 23 mmol/L (ref 20–29)
Calcium: 9.3 mg/dL (ref 8.7–10.3)
Chloride: 102 mmol/L (ref 96–106)
Creatinine, Ser: 1.43 mg/dL — ABNORMAL HIGH (ref 0.57–1.00)
GFR calc Af Amer: 36 mL/min/{1.73_m2} — ABNORMAL LOW (ref >59)
GFR calc non Af Amer: 32 mL/min/{1.73_m2} — ABNORMAL LOW (ref >59)
Glucose: 97 mg/dL (ref 65–99)
Potassium: 4.9 mmol/L (ref 3.5–5.2)
Sodium: 137 mmol/L (ref 134–144)

## 2019-01-17 IMAGING — DX DG CHEST 1V PORT
1 series · 1 of 1 positions shown · non-contrast
Comparison: 08/14/2016.

CLINICAL DATA: Shortness of breath.

EXAM:
PORTABLE CHEST 1 VIEW

[chest ap]
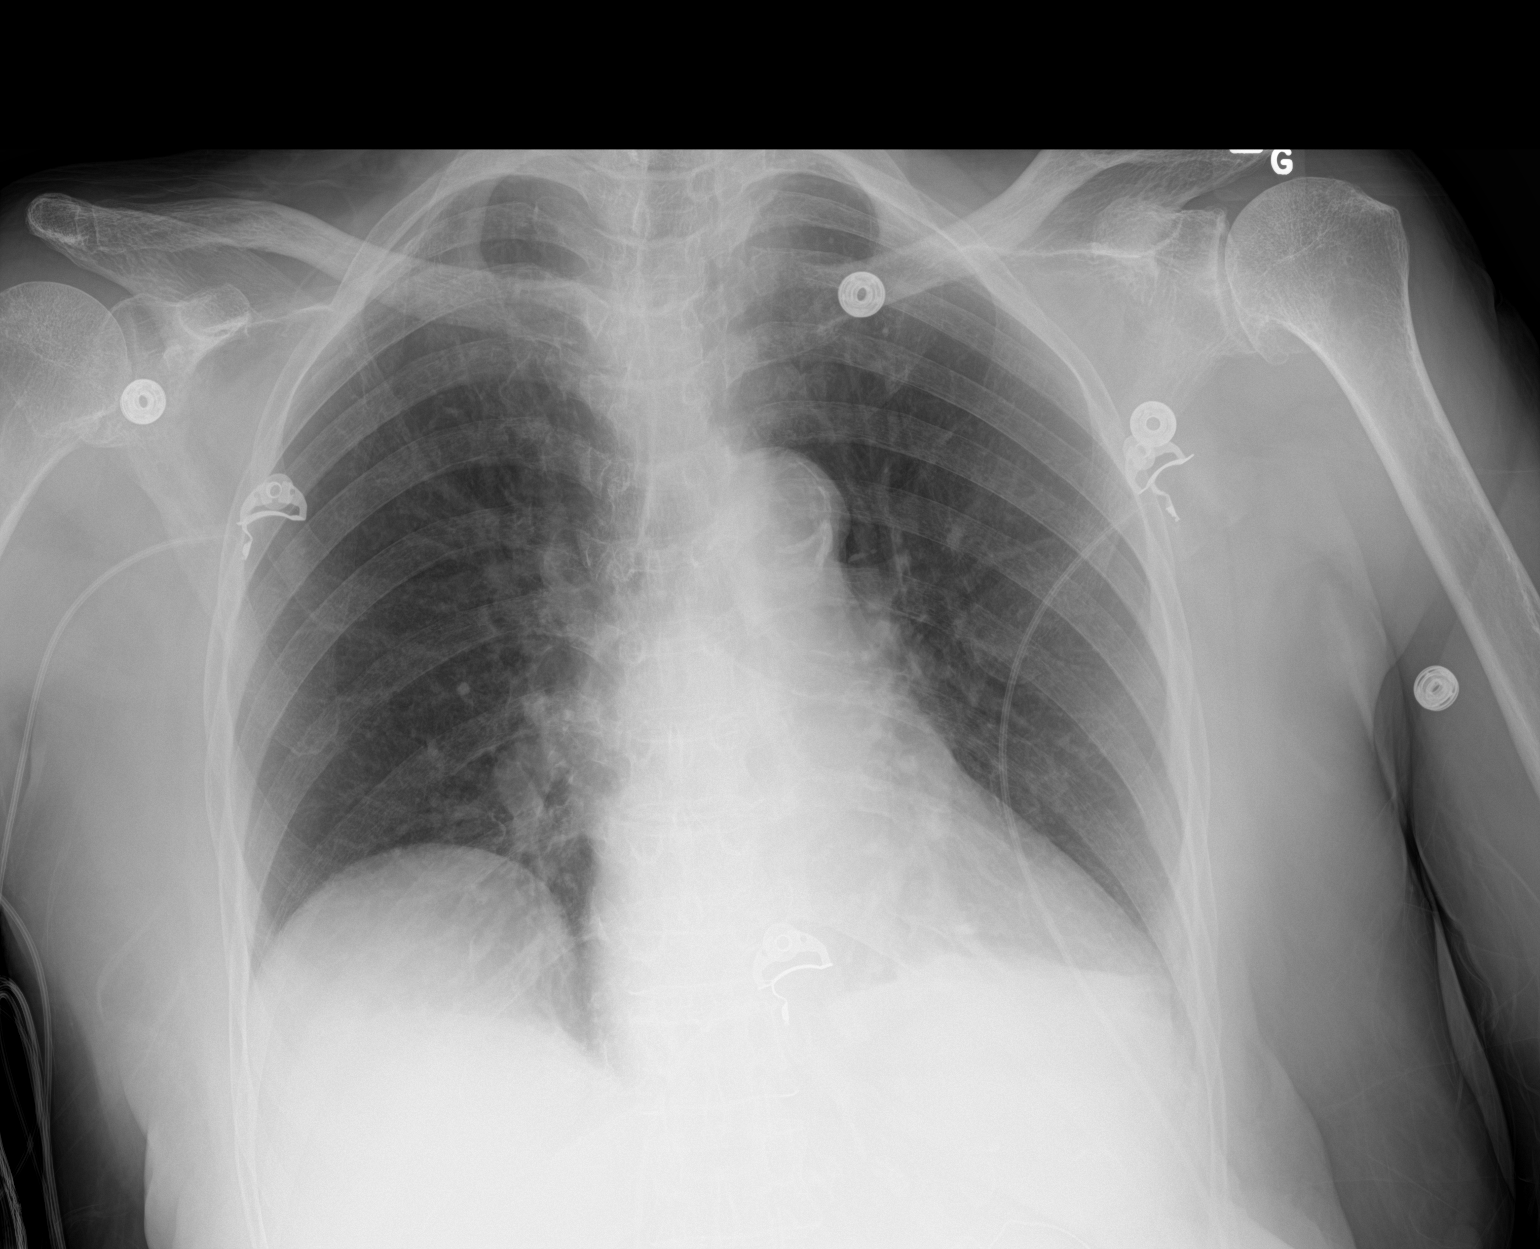

[1 of 1 positions shown; findings below may reference images not displayed]

FINDINGS: Mediastinum hilar structures are normal. Stable cardiomegaly. Normal
pulmonary vascularity. Low lung volumes, interval improvement of
aeration of left lung base. No infiltrate noted. No pleural effusion
or pneumothorax. Thoracic spine scoliosis and degenerative change
IMPRESSION: 1. Low lung volumes with interim improvement of aeration of left
lung base. No infiltrate noted.

2. Stable cardiomegaly.  No pulmonary venous congestion.

## 2019-01-21 ENCOUNTER — Ambulatory Visit (INDEPENDENT_AMBULATORY_CARE_PROVIDER_SITE_OTHER): Payer: Medicare Other

## 2019-01-21 DIAGNOSIS — I4819 Other persistent atrial fibrillation: Secondary | ICD-10-CM | POA: Diagnosis not present

## 2019-01-21 DIAGNOSIS — R55 Syncope and collapse: Secondary | ICD-10-CM | POA: Diagnosis not present

## 2019-01-21 DIAGNOSIS — N2889 Other specified disorders of kidney and ureter: Secondary | ICD-10-CM

## 2019-01-21 DIAGNOSIS — N183 Chronic kidney disease, stage 3 unspecified: Secondary | ICD-10-CM

## 2019-01-21 DIAGNOSIS — I10 Essential (primary) hypertension: Secondary | ICD-10-CM | POA: Diagnosis not present

## 2019-01-24 ENCOUNTER — Ambulatory Visit (INDEPENDENT_AMBULATORY_CARE_PROVIDER_SITE_OTHER): Payer: Medicare Other | Admitting: *Deleted

## 2019-01-24 ENCOUNTER — Other Ambulatory Visit: Payer: Self-pay

## 2019-01-24 DIAGNOSIS — Z5181 Encounter for therapeutic drug level monitoring: Secondary | ICD-10-CM

## 2019-01-24 DIAGNOSIS — I4892 Unspecified atrial flutter: Secondary | ICD-10-CM | POA: Diagnosis not present

## 2019-01-24 DIAGNOSIS — I4891 Unspecified atrial fibrillation: Secondary | ICD-10-CM

## 2019-01-24 LAB — POCT INR: INR: 2.5 (ref 2.0–3.0)

## 2019-01-24 NOTE — Patient Instructions (Signed)
Description   Pt's daughter present. Continue taking 2 tablets daily except 3 tablets on Sundays. Recheck in 6 weeks. Call [facility] with any new medications [phone].

## 2019-01-27 ENCOUNTER — Other Ambulatory Visit: Payer: Self-pay

## 2019-01-27 ENCOUNTER — Ambulatory Visit (INDEPENDENT_AMBULATORY_CARE_PROVIDER_SITE_OTHER): Payer: Medicare Other | Admitting: Podiatry

## 2019-01-27 ENCOUNTER — Ambulatory Visit (INDEPENDENT_AMBULATORY_CARE_PROVIDER_SITE_OTHER): Payer: Medicare Other

## 2019-01-27 DIAGNOSIS — L97512 Non-pressure chronic ulcer of other part of right foot with fat layer exposed: Secondary | ICD-10-CM

## 2019-01-27 DIAGNOSIS — E0859 Diabetes mellitus due to underlying condition with other circulatory complications: Secondary | ICD-10-CM

## 2019-01-27 DIAGNOSIS — I739 Peripheral vascular disease, unspecified: Secondary | ICD-10-CM

## 2019-01-29 NOTE — Progress Notes (Signed)
Subjective:  85 y.o. female with PMHx of diabetes mellitus presenting today for follow up evaluation of ulcerations of the right foot. She states she is doing well. She reports some soreness of the wounds. She has been using Betadine and the post op shoe as directed. There are no worsening factors noted. Patient is here for further evaluation and treatment.    Past Medical History:  Diagnosis Date  . Acute respiratory failure ([place])   . Anemia    previous blood transfusions  . Arthritis    "all over"  . Asthma   . Atrial flutter ([place])   . Bradycardia    requiring previous d/c of BB and reduction of amiodarone  . CAD (coronary artery disease)    nonobstructive per notes  . Chronic diastolic CHF (congestive heart failure) ([place])   . CKD (chronic kidney disease), stage III ([place])   . Complication of blood transfusion    "got the wrong blood type at [facility] in ~ 2015; no adverse reaction that we are aware of"/daughter, [name] (01/27/2016)  . COPD (chronic obstructive pulmonary disease) ([place])   . Depression    "light case"  . DVT (deep venous thrombosis) ([place]) 01/2016   a. LLE DVT 01/2016 - switched from Eliquis to Coumadin.  [name] Dyspnea    with some activity  . Gastric stenosis    a. s/p stomach tube  . GERD (gastroesophageal reflux disease)   . History of blood transfusion    "several" (01/27/2016)  . History of stomach ulcers   . Hyperlipidemia   . Hypertension   . Hypothyroidism   . On home oxygen therapy    "2L; 9PM til 9AM" (06/27/2017)  . PAD (peripheral artery disease) ([place])    a. prior gangrene, toe amputations, intervention.  [name] PAF (paroxysmal atrial fibrillation) ([place])   . Paraesophageal hernia   . Perforated gastric ulcer ([place])   . Pneumonia    "a few times" (06/27/2017)  . Seasonal allergies   . SIADH (syndrome of inappropriate ADH production) ([place])    [name] 01/10/2015  . Small bowel obstruction ([place])    "I don't know how many" (01/11/2015)  . Stroke [facility])     several- no residual  . Type II diabetes mellitus ([place])    "related to prednisone use  for > 20 yr; once predinose stopped; no more DM RX" (01/27/2016)  . UTI (urinary tract infection) 02/08/2016  . Ventral hernia with bowel obstruction       Objective/Physical Exam General: The patient is alert and oriented x3 in no acute distress.  Dermatology:  Wounds noted to 2nd and 3rd digits of the right foot, both measuring 0.5 x 0.5 x 0.3 cm (LxWxD).   To the noted ulceration(s), there is no eschar. There is a moderate amount of slough, fibrin, and necrotic tissue noted. Granulation tissue and wound base is red. There is a minimal amount of serosanguineous drainage noted. There is no exposed bone muscle-tendon ligament or joint. There is no malodor. Periwound integrity is intact. Skin is warm, dry and supple bilateral lower extremities.  Vascular: Palpable pedal pulses bilaterally. No edema or erythema noted. Capillary refill within normal limits.  Neurological: Epicritic and protective threshold diminished bilaterally.   Musculoskeletal Exam: Range of motion within normal limits to all pedal and ankle joints bilateral. Muscle strength 5/5 in all groups bilateral.   Assessment: 1. Ulcerations noted to digits 2 and 3 secondary to diabetes mellitus 2. diabetes mellitus w/ peripheral neuropathy  Plan of Care:  1. Patient was evaluated. 2. medically necessary excisional debridement including subcutaneous tissue was performed using a tissue nipper and a chisel blade. Excisional debridement of all the necrotic nonviable tissue down to healthy bleeding viable tissue was performed with post-debridement measurements same as pre-. 3. the wounds were cleansed and dry sterile dressing applied. 4. Continue using Betadine daily. 5. Continue using post op shoe.  6. Return to clinic in 6 weeks.   Edrick Kins, DPM [facility]  Dr. Edrick Kins, [place]                                         [place], [place] [address]                Office [phone]  Fax [phone]

## 2019-01-31 DIAGNOSIS — I10 Essential (primary) hypertension: Secondary | ICD-10-CM | POA: Diagnosis not present

## 2019-01-31 DIAGNOSIS — Z961 Presence of intraocular lens: Secondary | ICD-10-CM | POA: Diagnosis not present

## 2019-01-31 DIAGNOSIS — H16223 Keratoconjunctivitis sicca, not specified as Sjogren's, bilateral: Secondary | ICD-10-CM | POA: Diagnosis not present

## 2019-01-31 DIAGNOSIS — H401132 Primary open-angle glaucoma, bilateral, moderate stage: Secondary | ICD-10-CM | POA: Diagnosis not present

## 2019-02-03 ENCOUNTER — Other Ambulatory Visit: Payer: Self-pay | Admitting: *Deleted

## 2019-02-03 MED ORDER — INSULIN ASPART 100 UNIT/ML FLEXPEN: 0.0000 [IU] | PEN_INJECTOR | Freq: Three times a day (TID) | SUBCUTANEOUS | 3 refills | Status: DC

## 2019-02-03 NOTE — Telephone Encounter (Signed)
[facility] 

## 2019-02-05 ENCOUNTER — Other Ambulatory Visit: Payer: Self-pay | Admitting: Nurse Practitioner

## 2019-02-05 ENCOUNTER — Other Ambulatory Visit: Payer: Self-pay | Admitting: Cardiology

## 2019-02-06 ENCOUNTER — Other Ambulatory Visit: Payer: Self-pay | Admitting: Nurse Practitioner

## 2019-02-07 ENCOUNTER — Other Ambulatory Visit: Payer: Self-pay | Admitting: *Deleted

## 2019-02-07 MED ORDER — INSULIN GLARGINE 100 UNIT/ML ~~LOC~~ SOLN: 8.0000 [IU] | Freq: Every day | SUBCUTANEOUS | 3 refills | Status: DC

## 2019-02-07 NOTE — Telephone Encounter (Signed)
[facility] 

## 2019-02-11 ENCOUNTER — Ambulatory Visit: Payer: Medicare Other | Admitting: Nurse Practitioner

## 2019-02-12 ENCOUNTER — Ambulatory Visit: Payer: Medicare Other | Admitting: Nurse Practitioner

## 2019-02-12 ENCOUNTER — Ambulatory Visit (INDEPENDENT_AMBULATORY_CARE_PROVIDER_SITE_OTHER): Payer: Medicare Other | Admitting: Nurse Practitioner

## 2019-02-12 ENCOUNTER — Encounter: Payer: Self-pay | Admitting: Nurse Practitioner

## 2019-02-12 ENCOUNTER — Telehealth: Payer: Self-pay | Admitting: Pulmonary Disease

## 2019-02-12 ENCOUNTER — Other Ambulatory Visit: Payer: Self-pay

## 2019-02-12 VITALS — BP 128/66 | HR 65 | Temp 98.4°F | Ht 66.0 in | Wt 146.0 lb

## 2019-02-12 DIAGNOSIS — Z931 Gastrostomy status: Secondary | ICD-10-CM | POA: Diagnosis not present

## 2019-02-12 DIAGNOSIS — J449 Chronic obstructive pulmonary disease, unspecified: Secondary | ICD-10-CM

## 2019-02-12 DIAGNOSIS — M159 Polyosteoarthritis, unspecified: Secondary | ICD-10-CM

## 2019-02-12 DIAGNOSIS — Z89422 Acquired absence of other left toe(s): Secondary | ICD-10-CM | POA: Diagnosis not present

## 2019-02-12 DIAGNOSIS — I10 Essential (primary) hypertension: Secondary | ICD-10-CM | POA: Diagnosis not present

## 2019-02-12 DIAGNOSIS — R001 Bradycardia, unspecified: Secondary | ICD-10-CM | POA: Diagnosis not present

## 2019-02-12 DIAGNOSIS — D638 Anemia in other chronic diseases classified elsewhere: Secondary | ICD-10-CM

## 2019-02-12 DIAGNOSIS — E785 Hyperlipidemia, unspecified: Secondary | ICD-10-CM

## 2019-02-12 DIAGNOSIS — I779 Disorder of arteries and arterioles, unspecified: Secondary | ICD-10-CM

## 2019-02-12 DIAGNOSIS — K625 Hemorrhage of anus and rectum: Secondary | ICD-10-CM

## 2019-02-12 DIAGNOSIS — I5032 Chronic diastolic (congestive) heart failure: Secondary | ICD-10-CM

## 2019-02-12 DIAGNOSIS — E1121 Type 2 diabetes mellitus with diabetic nephropathy: Secondary | ICD-10-CM

## 2019-02-12 DIAGNOSIS — I4819 Other persistent atrial fibrillation: Secondary | ICD-10-CM

## 2019-02-12 DIAGNOSIS — M8949 Other hypertrophic osteoarthropathy, multiple sites: Secondary | ICD-10-CM | POA: Diagnosis not present

## 2019-02-12 DIAGNOSIS — Z794 Long term (current) use of insulin: Secondary | ICD-10-CM

## 2019-02-12 DIAGNOSIS — E43 Unspecified severe protein-calorie malnutrition: Secondary | ICD-10-CM

## 2019-02-12 DIAGNOSIS — E038 Other specified hypothyroidism: Secondary | ICD-10-CM | POA: Diagnosis not present

## 2019-02-12 DIAGNOSIS — N1832 Chronic kidney disease, stage 3b: Secondary | ICD-10-CM

## 2019-02-12 DIAGNOSIS — E1122 Type 2 diabetes mellitus with diabetic chronic kidney disease: Secondary | ICD-10-CM

## 2019-02-12 NOTE — Telephone Encounter (Signed)
Spoke with [name], pt's daughter  She states that the pt was started on o2 by the hospital over a year ago and never really used it  Her PCP then told her not to use so she did not  She was then started on noct o2 per [facility] in July 2020  She is now being told by [employer] that it's time for her to recertify for her daytime o2, but she is asking if this it really necessary  Please advise thanks

## 2019-02-12 NOTE — Progress Notes (Signed)
Careteam: Patient Care Team: [name], NP as PCP - General (Geriatric Medicine) Dorothy Spark, MD as PCP - Cardiology (Cardiology) Edrick Kins, DPM as Consulting Physician (Podiatry) Dorothy Spark, MD as Consulting Physician (Cardiology) [name], NP as Nurse Practitioner (Geriatric Medicine) Camillo Flaming, [place] as Referring Physician (Optometry) Ple[place], Eppie Gibson, RN as [place] Management  Advanced Directive information Does Patient Have a Medical Advance Directive?: Yes, Type of Advance Directive: Healthcare Power of Attorney  Allergies  Allergen Reactions  . Penicillins Itching, Rash and Other (See Comments)    Tolerated amoxicillin, unasyn, zosyn & cephalosporins in the past. Did it involve swelling of the face/tongue/throat, SOB, or low BP? No Did it involve sudden or severe rash/hives, skin peeling, or any reaction on the inside of your mouth or nose? No Did you need to seek medical attention at a hospital or doctor's office? Unknown When did it last happen?5+ years If all above answers are "NO", may proceed with cephalosporin use.     Chief Complaint  Patient presents with  . Medical Management of Chronic Issues    4 month follow-up. Patient c/o arthritis.   . Form Completion    Complete form for free b/p machine through [employer]     HPI: Patient is a 85 y.o. female seen in the office today for routine follow up.  She has had 2 hospitalizations in the last 4 months for syncope and collapse and rectal bleeding.  medical history significant forchronic diastolic CHF, paroxysmal atrial fibrillation on Coumadin,gastric outlet obstruction s/p gastrojejunostomy feeding tube placement,nonobstructive CAD, history of CVA, COPD, CKD stage III, insulin-dependent type 2 diabetes, hypertension, hyperlipidemia, anemia of chronic disease, hypothyroidism, and depression.   Pt was bradycardic when she had syncopal episode-  coreg was stopped and HR has been monitored. Afib- followed by cardiologist, INR followed at coumadin clinic. Plan 30 day event monitor which she has on now and carotid dopplers which have been done.   Noted to have rectal bleeding in September thought to be due to hemorrhoids and hgb remained stable during hospitalization.   HTN- norvasc was added during hospitalization. Also on hydralazine 50 mg TID and imdur 30 mg  2 tablets daily, no chest pains. Mild edema noted. Blood pressure has been controlled.   CHF- LVEF normal with DD compensated, taking lasix 20 mg daily does not need additional dosing.   DM- a1c 6.3 at goal. No hypoglycemia- taking lantus and novolog on SSI  Neuropathy- controlled on gabapentin 300 mg BID.   hypothryroid- TSH at goal in July 0.98, continues on synthroid 125 mcg daily   Seasonal allergies- taking claritin 10 mg or zyrtec 10 mg daily, having allergies this time of year. Using flonase  COPD- stable on dulera, not needed rescue   PEG tube-stays full on osmolite feedings at night. No yeast around site currently but prone to this.   Anemia- continues on ferrous sulfate   Hyperlipidemia- controlled on lipitor 10 mg daily, LDL at goal. Wants to get off this.   Ulcer of right foot- previously using santyl but now using betadine daily  Yeast- frequently getting yeast around feeding tube, none at this time.   Chronic pain/oa- will take oxycodone if needed for severe pain to neck and shoulder. Not doing exercises   GERD- continues on protonix BID.   Depression- on zoloft, mood is stable on reduced dose.  "not depressed" in remission.  Review of Systems:  Review of Systems  Constitutional: Negative for chills, fever and weight loss.  HENT: Negative for tinnitus.   Respiratory: Negative for cough, sputum production and shortness of breath.   Cardiovascular: Negative for chest pain, palpitations and leg swelling.  Gastrointestinal: Positive for constipation (at  times). Negative for abdominal pain, diarrhea and heartburn.  Genitourinary: Negative for dysuria, frequency and urgency.  Musculoskeletal: Positive for joint pain, myalgias and neck pain. Negative for back pain and falls.  Skin: Negative.   Neurological: Negative for dizziness and headaches.  Psychiatric/Behavioral: Negative for depression and memory loss. The patient does not have insomnia.     Past Medical History:  Diagnosis Date  . Acute respiratory failure ([place])   . Anemia    previous blood transfusions  . Arthritis    "all over"  . Asthma   . Atrial flutter ([place])   . Bradycardia    requiring previous d/c of BB and reduction of amiodarone  . CAD (coronary artery disease)    nonobstructive per notes  . Chronic diastolic CHF (congestive heart failure) ([place])   . CKD (chronic kidney disease), stage III   . Complication of blood transfusion    "got the wrong blood type at [facility] in ~ 2015; no adverse reaction that we are aware of"/daughter, [name] (01/27/2016)  . COPD (chronic obstructive pulmonary disease) ([place])   . Depression    "light case"  . DVT (deep venous thrombosis) ([place]) 01/2016   a. LLE DVT 01/2016 - switched from Eliquis to Coumadin.  [name] Dyspnea    with some activity  . Gastric stenosis    a. s/p stomach tube  . GERD (gastroesophageal reflux disease)   . History of blood transfusion    "several" (01/27/2016)  . History of stomach ulcers   . Hyperlipidemia   . Hypertension   . Hypothyroidism   . On home oxygen therapy    "2L; 9PM til 9AM" (06/27/2017)  . PAD (peripheral artery disease) ([place])    a. prior gangrene, toe amputations, intervention.  [name] PAF (paroxysmal atrial fibrillation) ([place])   . Paraesophageal hernia   . Perforated gastric ulcer ([place])   . Pneumonia    "a few times" (06/27/2017)  . Seasonal allergies   . SIADH (syndrome of inappropriate ADH production) ([place])    [name] 01/10/2015  . Small bowel obstruction ([place])    "I don't know how many"  (01/11/2015)  . Stroke [facility])    several- no residual  . Type II diabetes mellitus ([place])    "related to prednisone use  for > 20 yr; once predinose stopped; no more DM RX" (01/27/2016)  . UTI (urinary tract infection) 02/08/2016  . Ventral hernia with bowel obstruction    Past Surgical History:  Procedure Laterality Date  . ABDOMINAL AORTOGRAM N/A 09/21/2016   Procedure: Abdominal Aortogram;  Surgeon: Waynetta Sandy, MD;  Location: [facility];  Service: Cardiovascular;  Laterality: N/A;  . AMPUTATION Left 09/25/2016   Procedure: AMPUTATION DIGIT- LEFT 2ND AND 3RD TOES;  Surgeon: Rosetta Posner, MD;  Location: [facility];  Service: Vascular;  Laterality: Left;  . AMPUTATION Right 05/03/2018   Procedure: AMPUTATION RIGHT GREAT TOE;  Surgeon: Rosetta Posner, MD;  Location: [place];  Service: Vascular;  Laterality: Right;  . CATARACT EXTRACTION W/ INTRAOCULAR LENS  IMPLANT, BILATERAL    . CHOLECYSTECTOMY OPEN    . COLECTOMY    . ESOPHAGOGASTRODUODENOSCOPY N/A 01/19/2014   Procedure: ESOPHAGOGASTRODUODENOSCOPY (EGD);  Surgeon: Irene Shipper, MD;  [facility];  Service: Endoscopy;  Laterality: N/A;  . ESOPHAGOGASTRODUODENOSCOPY N/A 01/20/2014   Procedure: ESOPHAGOGASTRODUODENOSCOPY (EGD);  Surgeon: Irene Shipper, MD;  Location: [facility];  Service: Endoscopy;  Laterality: N/A;  . ESOPHAGOGASTRODUODENOSCOPY N/A 03/19/2014   Procedure: ESOPHAGOGASTRODUODENOSCOPY (EGD);  Surgeon: Milus Banister, MD;  Location: [facility];  Service: Endoscopy;  Laterality: N/A;  . ESOPHAGOGASTRODUODENOSCOPY N/A 07/08/2015   Procedure: ESOPHAGOGASTRODUODENOSCOPY (EGD);  Surgeon: Doran Stabler, MD;  Location[facility];  Service: Endoscopy;  Laterality: N/A;  . ESOPHAGOGASTRODUODENOSCOPY (EGD) WITH PROPOFOL N/A 09/15/2015   Procedure: ESOPHAGOGASTRODUODENOSCOPY (EGD) WITH PROPOFOL;  Surgeon: Manus Gunning, MD;  [facility];  Service: Gastroenterology;  Laterality: N/A;  .  GASTROJEJUNOSTOMY     hx/notes 01/10/2015  . GASTROJEJUNOSTOMY N/A 09/23/2015   Procedure: OPEN GASTROJEJUNOSTOMY TUBE PLACEMENT;  Surgeon: Arta Bruce Kinsinger, MD;  [facility];  Service: General;  Laterality: N/A;  . GLAUCOMA SURGERY Bilateral   . HERNIA REPAIR  2015  . IR CM INJ ANY COLONIC TUBE W/FLUORO  01/05/2017  . IR CM INJ ANY COLONIC TUBE W/FLUORO  06/07/2017  . IR CM INJ ANY COLONIC TUBE W/FLUORO  11/05/2017  . IR CM INJ ANY COLONIC TUBE W/FLUORO  09/18/2018  . IR GENERIC HISTORICAL  01/07/2016   IR GJ TUBE CHANGE 01/07/2016 Jacqulynn Cadet, MD [facility]  . IR GENERIC HISTORICAL  01/27/2016   IR MECH REMOV OBSTRUC MAT ANY COLON TUBE W/FLUORO 01/27/2016 Markus Daft, MD [facility]  . IR GENERIC HISTORICAL  02/07/2016   IR PATIENT EVAL TECH 0-60 MINS Darrell K Allred, PA-C [facility]  . IR GENERIC HISTORICAL  02/08/2016   IR GJ TUBE CHANGE 02/08/2016 Greggory Keen, MD [facility]  . IR GENERIC HISTORICAL  01/06/2016   IR GJ TUBE CHANGE 01/06/2016 [facility] OUT REF  . IR GENERIC HISTORICAL  05/02/2016   IR CM INJ ANY COLONIC TUBE W/FLUORO 05/02/2016 Arne Cleveland, MD [facility]  . IR GENERIC HISTORICAL  05/15/2016   IR GJ TUBE CHANGE 05/15/2016 Sandi Mariscal, MD [facility]  . IR GENERIC HISTORICAL  06/28/2016   IR GJ TUBE CHANGE 06/28/2016 [facility]  . IR GJ TUBE CHANGE  02/20/2017  . IR GJ TUBE CHANGE  05/10/2017  . IR GJ TUBE CHANGE  07/06/2017  . IR GJ TUBE CHANGE  08/02/2017  . IR GJ TUBE CHANGE  10/15/2017  . IR GJ TUBE CHANGE  12/26/2017  . IR [place] TUBE CHANGE  04/08/2018  . IR [place] TUBE CHANGE  06/19/2018  . IR PATIENT EVAL TECH 0-60 MINS  10/19/2016  . IR PATIENT EVAL TECH 0-60 MINS  12/25/2016  . IR PATIENT EVAL TECH 0-60 MINS  01/29/2017  . IR PATIENT EVAL TECH 0-60 MINS  04/04/2017  . IR PATIENT EVAL TECH 0-60 MINS  04/30/2017  . IR PATIENT EVAL TECH 0-60 MINS  07/31/2017  . IR REPLC DUODEN/JEJUNO TUBE PERCUT W/FLUORO  11/14/2016  . LAPAROTOMY N/A 01/20/2015   Procedure:  EXPLORATORY LAPAROTOMY;  Surgeon: Coralie Keens, MD;  Location: [place] [facility];  Service: General;  Laterality: N/A;  . LOWER EXTREMITY ANGIOGRAPHY Left 09/21/2016   Procedure: Lower Extremity Angiography;  Surgeon: Waynetta Sandy, MD;  Location: [place] [facility];  Service: Cardiovascular;  Laterality: Left;  . LOWER EXTREMITY ANGIOGRAPHY Right 06/27/2017   Procedure: Lower Extremity Angiography;  Surgeon: Waynetta Sandy, MD;  Location: [facility];  Service: Cardiovascular;  Laterality: Right;  . LYSIS OF ADHESION N/A 01/20/2015   Procedure:  LYSIS OF ADHESIONS < 1 HOUR;  Surgeon: Coralie Keens, MD;  Location: [facility];  Service: General;  Laterality: N/A;  . PERIPHERAL VASCULAR BALLOON ANGIOPLASTY Left 09/21/2016   Procedure: Peripheral Vascular Balloon Angioplasty;  Surgeon: Waynetta Sandy, MD;  Location: [facility];  Service: Cardiovascular;  Laterality: Left;  drug coated balloon  . PERIPHERAL VASCULAR BALLOON ANGIOPLASTY Right 06/27/2017   Procedure: PERIPHERAL VASCULAR BALLOON ANGIOPLASTY;  Surgeon: Waynetta Sandy, MD;  Location: [facility];  Service: Cardiovascular;  Laterality: Right;  SFA/TPTRUNK  . TONSILLECTOMY    . TUBAL LIGATION    . VENTRAL HERNIA REPAIR  2015   incarcerated ventral hernia ([facility] 09/2013)/notes 01/10/2015   Social History:   reports that she has never smoked. She quit smokeless tobacco use about 38 years ago.  Her smokeless tobacco use included snuff. She reports that she does not drink alcohol or use drugs.  Family History  Problem Relation Age of Onset  . Stroke Mother   . Hypertension Mother   . Diabetes Brother   . Glaucoma Son   . Glaucoma Son   . Heart attack Neg Hx   . Colon cancer Neg Hx   . Stomach cancer Neg Hx     Medications: Patient's Medications  New Prescriptions   No medications on file  Previous Medications   ACETAMINOPHEN (TYLENOL) 500 MG TABLET    Take 500 mg by mouth every 6  (six) hours as needed (pain).    AMINO ACIDS-PROTEIN HYDROLYS (FEEDING SUPPLEMENT, PRO-STAT SUGAR FREE 64,) LIQD    Take 30 mLs by mouth 2 (two) times daily.   AMLODIPINE (NORVASC) 5 MG TABLET    Take 1 tablet (5 mg total) by mouth daily.   ATORVASTATIN (LIPITOR) 10 MG TABLET    Take 1 tablet (10 mg total) by mouth daily.   B COMPLEX VITAMINS TABLET    Take 1 tablet by mouth daily.    CALCIUM-VITAMIN D (OSCAL WITH D) 500-200 MG-UNIT PER TABLET    Take 1 tablet by mouth daily with breakfast.    CARBOXYMETHYLCELLULOSE SODIUM (ARTIFICIAL TEARS OP)    Place 1 drop into both eyes 4 (four) times daily.   COLLAGENASE (SANTYL) OINTMENT    Apply 1 application topically daily as needed (wound care).    CYCLOBENZAPRINE (FLEXERIL) 5 MG TABLET    Take 1 tablet (5 mg total) by mouth 3 (three) times daily as needed for muscle spasms.   CYCLOSPORINE (RESTASIS) 0.05 % OPHTHALMIC EMULSION    USE 1 DROP INTO BOTH EYES TWICE DAILY   DULERA 200-5 MCG/ACT AERO    TAKE TWO PUFFS BY MOUTH TWICE DAILY   FEEDING SUPPLEMENT (OSMOLITE 1 CAL) LIQD    Take 711 mLs by mouth See admin instructions. For 12 hours 2100 - 0900   FERROUS SULFATE 325 (65 FE) MG TABLET    TAKE 1 TABLET BY MOUTH EVERY DAY   FLUTICASONE (FLONASE) 50 MCG/ACT NASAL SPRAY    SPRAY 2 SPRAYS INTO EACH NOSTRIL EVERY DAY   FUROSEMIDE (LASIX) 40 MG TABLET    Take 0.5 tablets (20 mg total) by mouth daily. May take an extra 1/2 tablet as needed for swelling, weight gain, or shortness of breath   GABAPENTIN (NEURONTIN) 300 MG CAPSULE    TAKE 1 CAPSULE BY MOUTH TWICE A DAY   GENTAMICIN CREAM (GARAMYCIN) 0.1 %    Apply 1 application topically 3 (three) times daily as needed (dry skin).    HYDRALAZINE (APRESOLINE) 50  MG TABLET    TAKE 1 TABLET BY MOUTH THREE TIMES A DAY   INSULIN ASPART (NOVOLOG) 100 UNIT/ML FLEXPEN    Inject 0-6 Units into the skin 3 (three) times daily with meals. New sliding Scale If BS is 151 - 251 = 2 units,  If BS is 251 -  350 = 4 units,  If BS  is 351 - 450 = 6 units Call provider if BS runs more than 400   INSULIN GLARGINE (LANTUS) 100 UNIT/ML INJECTION    Inject 0.08 mLs (8 Units total) into the skin at bedtime.   INSULIN PEN NEEDLE (B-D UF III MINI PEN NEEDLES) 31G X 5 MM MISC    Use twice daily with giving insulin injections. Dx E11.9   IPRATROPIUM-ALBUTEROL (DUONEB) 0.5-2.5 (3) MG/3ML SOLN    Take 3 mLs by nebulization every 6 (six) hours as needed (sob). Use 3 times daily x 4 days then every 6 hours as needed.   ISOSORBIDE MONONITRATE (IMDUR) 30 MG 24 HR TABLET    TAKE 2 TABLETS BY MOUTH DAILY. PLEASE MAKE ANNUAL APPT WITH DR. Meda Coffee FOR FUTURE REFILLS. THANK YOU. 1ST ATTEMPT.   LATANOPROST (XALATAN) 0.005 % OPHTHALMIC SOLUTION    Place 1 drop into both eyes at bedtime.   LEVALBUTEROL (XOPENEX HFA) 45 MCG/ACT INHALER    Inhale 2 puffs into the lungs every 6 (six) hours as needed for wheezing or shortness of breath.   LEVOTHYROXINE (SYNTHROID, LEVOTHROID) 125 MCG TABLET    Take 1 tablet (125 mcg total) by mouth daily.   LORATADINE (CLARITIN) 10 MG TABLET    Take 10 mg by mouth daily as needed for allergies.   MULTIPLE VITAMIN (MULTIVITAMIN WITH MINERALS) TABS TABLET    Take 1 tablet by mouth daily.   NYSTATIN (MYCOSTATIN/NYSTOP) POWDER    Apply 1 g topically as needed (skin irritation).    OXYCODONE (ROXICODONE) 5 MG IMMEDIATE RELEASE TABLET    Take 1 tablet (5 mg total) by mouth every 4 (four) hours as needed for severe pain. May take 1/2 tab q4hrs as needed for mild pain.   OXYGEN    Inhale 2 L into the lungs at bedtime.   PANTOPRAZOLE (PROTONIX) 40 MG TABLET    TAKE 1 TABLET BY MOUTH TWICE A DAY   SERTRALINE (ZOLOFT) 50 MG TABLET    Take 1 tablet (50 mg total) by mouth daily.   WARFARIN (COUMADIN) 2.5 MG TABLET    TAKE AS DIRECTED BY COUMADIN CLINIC.   WATER FOR IRRIGATION, STERILE (FREE WATER) SOLN    Place 150 mLs into feeding tube 3 (three) times daily.    WHITE PETROLATUM-MINERAL OIL (SYSTANE NIGHTTIME) OINT    Place 1  application into both eyes at bedtime.   Modified Medications   No medications on file  Discontinued Medications   NYSTATIN (MYCOSTATIN) 100000 UNIT/ML SUSPENSION    Take 5 mLs (500,000 Units total) by mouth 4 (four) times daily.    Physical Exam:  Vitals:   02/12/19 1118  BP: 128/66  Pulse: 65  Temp: 98.4 F (36.9 C)  TempSrc: Temporal  SpO2: 97%  Weight: 146 lb (66.2 kg)  Height: 5\' 6"  (1.676 m)   Body mass index is 23.57 kg/m. Wt Readings from Last 3 Encounters:  02/12/19 146 lb (66.2 kg)  01/01/19 143 lb 6.4 oz (65 kg)  12/26/18 137 lb 12.6 oz (62.5 kg)    Physical Exam Constitutional:      Appearance: Normal appearance.  HENT:  Head: Normocephalic and atraumatic.  Eyes:     Pupils: Pupils are equal, round, and reactive to light.  Neck:     Comments: Decrease ROM with tenderness Cardiovascular:     Rate and Rhythm: Rhythm irregular.  Pulmonary:     Effort: Pulmonary effort is normal.     Breath sounds: Normal breath sounds.  Abdominal:     General: Abdomen is flat.     Palpations: Abdomen is soft.  Musculoskeletal:     Right lower leg: Edema present.     Left lower leg: Edema present.     Comments: Trace LE edema Boot to right foot with dressing intact   Neurological:     Mental Status: She is alert and oriented to person, place, and time.  Psychiatric:        Mood and Affect: Mood normal.        Behavior: Behavior normal.     Labs reviewed: Basic Metabolic Panel: Recent Labs    04/17/18 1526  10/15/18 1122  12/26/18 0533 01/01/19 1404 01/15/19 1313  NA 141   < > 138   < > 138 137 137  K 4.4   < > 4.7   < > 4.0 4.8 4.9  CL 101   < > 101   < > 106 96 102  CO2 34*   < > 31   < > 23 26 23   GLUCOSE 86   < > 100*   < > 83 94 97  BUN 68*   < > 68*   < > 52* 69* 58*  CREATININE 1.49*   < > 1.57*   < > 1.28* 1.61* 1.43*  CALCIUM 9.9   < > 10.1   < > 9.4 9.7 9.3  TSH 0.15*  --  0.98  --   --   --   --    < > = values in this interval not  displayed.   Liver Function Tests: Recent Labs    11/24/18 2036 12/25/18 1518 12/26/18 0533  AST 32 27 24  ALT 20 20 19   ALKPHOS 71 87 84  BILITOT 0.5 0.5 0.6  PROT 7.6 7.9 7.4  ALBUMIN 3.8 3.8 3.5   Recent Labs    08/27/18 1100  LIPASE 24  AMYLASE 60   No results for input(s): AMMONIA in the last 8760 hours. CBC: Recent Labs    10/15/18 1122 11/24/18 2036  12/02/18 1555 12/25/18 1518 12/26/18 0533 01/01/19 1404  WBC 4.7 6.5   < > 5.7 7.8 5.4 6.0  NEUTROABS 2,797 4.2  --  3,631  --   --   --   HGB 10.3* 9.6*   < > 9.6* 10.1* 9.8* 9.6*  HCT 31.5* 29.7*   < > 29.2* 30.9* 30.0* 30.0*  MCV 93.8 97.4   < > 94.8 94.5 95.8 93  PLT 209 201   < > 225 264 225 243   < > = values in this interval not displayed.   Lipid Panel: Recent Labs    10/15/18 1122  CHOL 131  HDL 47*  LDLCALC 58  TRIG 188*  CHOLHDL 2.8   TSH: Recent Labs    04/17/18 1526 10/15/18 1122  TSH 0.15* 0.98   A1C: Lab Results  Component Value Date   HGBA1C 6.3 (H) 10/15/2018     Assessment/Plan 1. COPD with asthma ([place]) -doing well with dulera, not needed as needed inhaler or nebulizer at this time. No recent flare.  2. Type  2 diabetes mellitus with stage 3b chronic kidney disease, with long-term current use of insulin (HCC) A1c at goal. No hypoglycemia noted. Continues on lantus 8 units with SSI.   3. PEG (percutaneous endoscopic gastrostomy) status ([place]) Doing well,without complications at this time.  4. S/P amputation of lesser toe, left ([place]) Followed by podiatrist   5. Chronic diastolic CHF (congestive heart failure) (HCC) -euvolemic, taking lasix 20 mg daily and maintaining weight. Without worsening shortness of breath, swelling or chest pain.   6. Protein-calorie malnutrition, severe ([place]) Has improved. Protein and albumin stable at this time  7. BRBPR (bright red blood per rectum) Thought to be due to hemorrhoids. hgb has remained stable.   8. Bradycardia -improved  since off betablocker.   9. Essential hypertension, benign -blood pressure stable. norvasc was added. Educated pt and family in regards to LE edema which is a common side effect of norvasc. Currently without issues. Continues on hydralazine 50 mg TID with imdur.  10. Other specified hypothyroidism TSH 0.98 in July, continues on synthroid 125 mcg  11. Anemia of chronic disease -hgb has remained stable. Continues on iron supplement   12. Primary osteoarthritis involving multiple joints -ongoing, uses oxycodone for severe pain, encouraged to do home exercises given by PT for neck pain, to use heating pad with muscle rub after as needed.   13. Hyperlipidemia LDL goal <70 LDL at goal in July, continues on lipitor 10 mg daily. Discussed risk vs benefitls of lipitor and she is agreeable to continue medication.   14. Persistent atrial fibrillation Rate controlled off betablocker, continues on coumadin through anticoagulation clinic   Next appt: 4 months, sooner if needed  Tallulah Hosman K. Arthur, [facility] [phone]

## 2019-02-13 ENCOUNTER — Other Ambulatory Visit: Payer: Self-pay | Admitting: *Deleted

## 2019-02-13 DIAGNOSIS — Z89422 Acquired absence of other left toe(s): Secondary | ICD-10-CM | POA: Insufficient documentation

## 2019-02-13 NOTE — Telephone Encounter (Signed)
Not sure why she need oxygen during the daytime Can we do a walk test?

## 2019-02-13 NOTE — Patient Outreach (Signed)
[facility]) Care Management  02/13/2019  Shannon [name] [birth year] [id number]   RN Health Coach attempted follow up outreach call to patient.  Patient was unavailable.RN spoke with son in law and he stated his wife handles all the calls. He took name and call back number.  Plan: RN will call patient again within 30 days.  [facility] [phone]

## 2019-02-13 NOTE — Telephone Encounter (Signed)
Left message for patient's daughter to call back. 

## 2019-02-14 NOTE — Telephone Encounter (Signed)
LMTCB x2 for pt's daughter.

## 2019-02-15 ENCOUNTER — Other Ambulatory Visit: Payer: Self-pay | Admitting: Nurse Practitioner

## 2019-02-17 ENCOUNTER — Other Ambulatory Visit: Payer: Self-pay | Admitting: *Deleted

## 2019-02-17 MED ORDER — INSULIN GLARGINE 100 UNIT/ML ~~LOC~~ SOLN: 8.0000 [IU] | Freq: Every day | SUBCUTANEOUS | 3 refills | Status: DC

## 2019-02-17 NOTE — Telephone Encounter (Signed)
LMTCB

## 2019-02-17 NOTE — Telephone Encounter (Signed)
Shannon [name] requested refill Faxed.

## 2019-02-18 IMAGING — DX DG CHEST 1V PORT
1 series · 1 of 1 positions shown · non-contrast
Comparison: September 14, 2016

CLINICAL DATA: Respiratory failure

EXAM:
PORTABLE CHEST 1 VIEW

[chest]
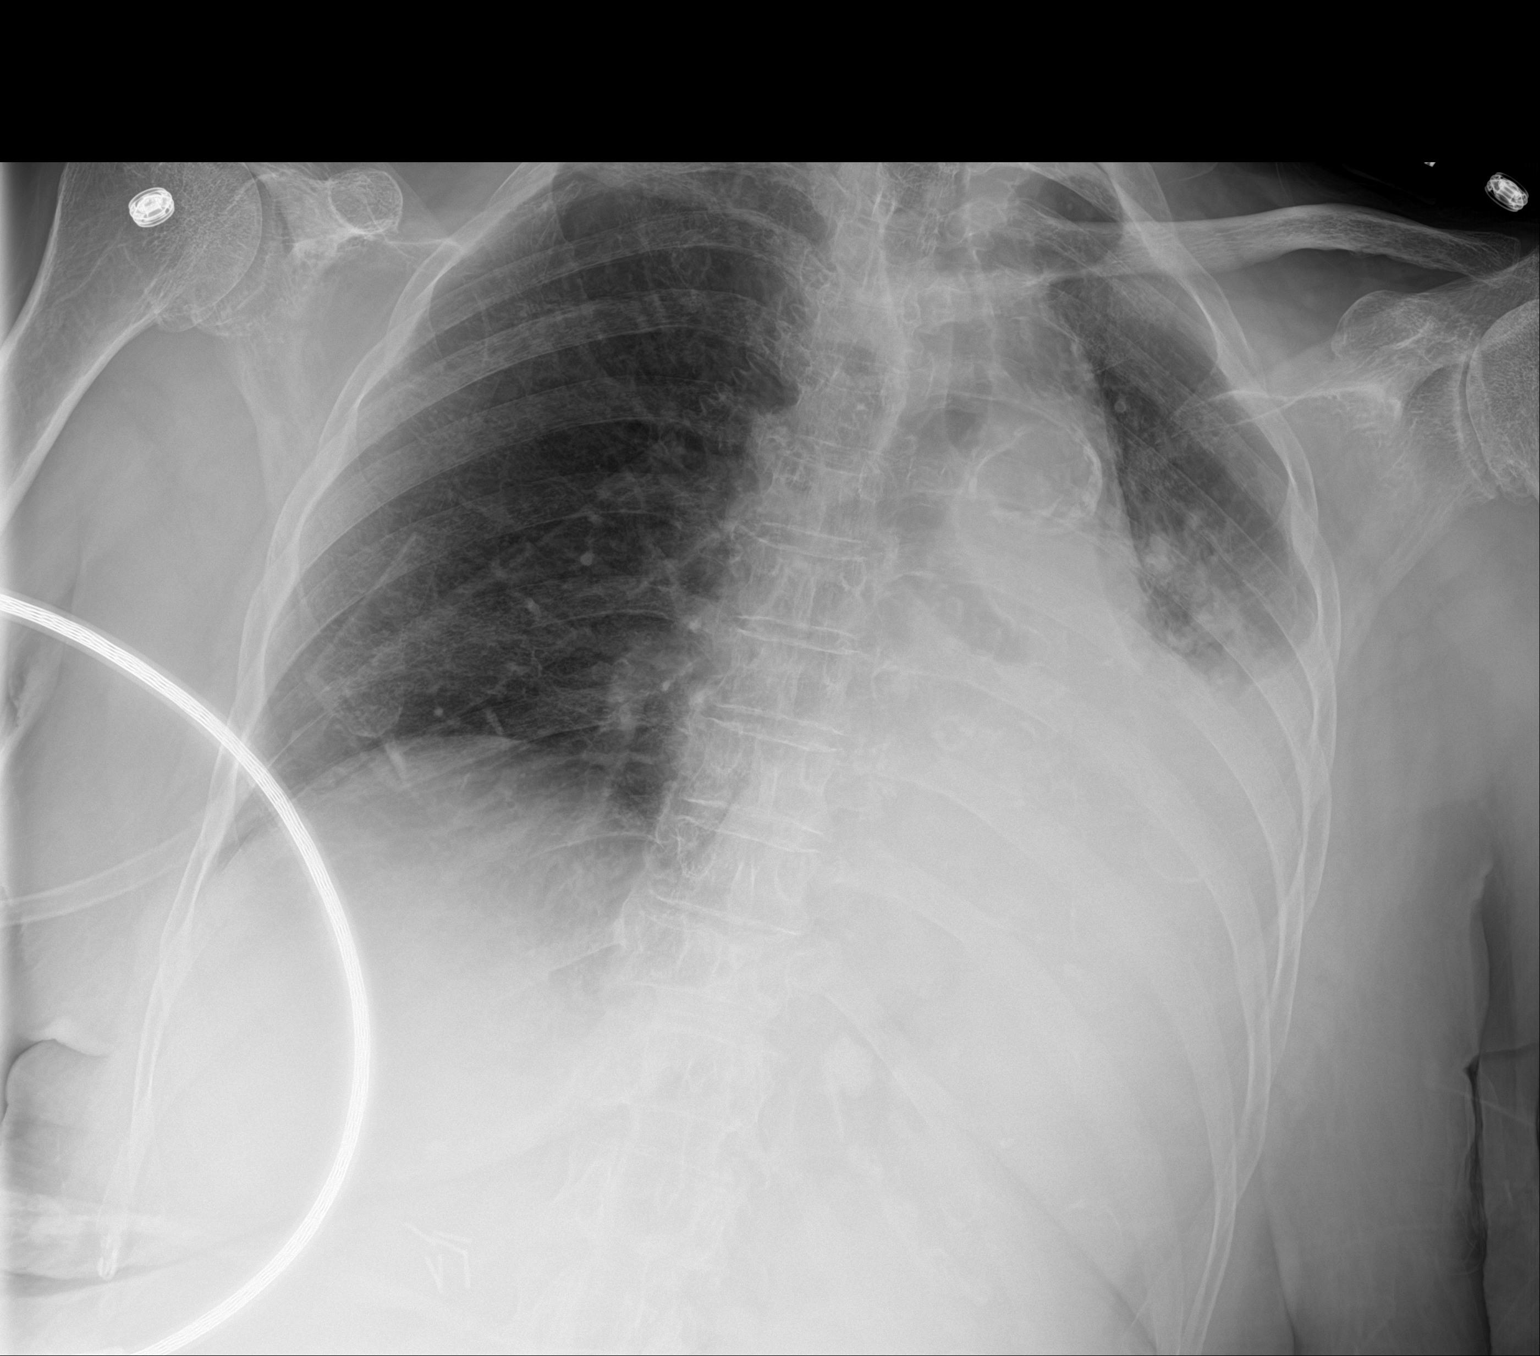

[1 of 1 positions shown; findings below may reference images not displayed]

FINDINGS: No pneumothorax. The right lung is clear. Increasing effusion
underlying opacity on the left. Shift of the heart and mediastinum
to the left may represent some underlying volume loss is well. No
other interval changes.
IMPRESSION: Increasing effusion and underlying opacity on the left with possible
associated volume loss as above. Recommend follow-up to resolution.

## 2019-02-18 NOTE — Progress Notes (Signed)
Virtual Visit via Telephone Note   This visit type was conducted due to national recommendations for restrictions regarding the COVID-19 Pandemic (e.g. social distancing) in an effort to limit this patient's exposure and mitigate transmission in our community.  Due to her co-morbid illnesses, this patient is at least at moderate risk for complications without adequate follow up.  This format is felt to be most appropriate for this patient at this time.  The patient did not have access to video technology/had technical difficulties with video requiring transitioning to audio format only (telephone).  All issues noted in this document were discussed and addressed.  No physical exam could be performed with this format.  Please refer to the patient's chart for her  consent to telehealth for [facility].   Date:  02/19/2019   ID:  Shannon [name], DOB 1924-01-29, MRN [id number]  Patient Location: Home Provider Location: Home  PCP:  Sharon Seller, NP  Cardiologist:  Tobias Alexander, MD   Electrophysiologist:  None   Evaluation Performed:  Follow-Up Visit  Chief Complaint: Swelling  History of Present Illness:    Shannon [name] is a 85 y.o. female with with history of chronic diastolic CHF, paroxysmal atrial fibrillation on Coumadin, gastric outlet obstruction s/p gastrojejunostomy feeding tube , nonobstructive CAD, history of CVA, COPD, CKD stage III, insulin-dependent type 2 diabetes, hypertension, hyperlipidemia, anemia of chronic disease, lower extremity PAD s/p bilateral interventions followed by [facility],hypothyroidism, and depression    Patient admitted with syncope 11/24/2018 possible secondary to bradycardia-coreg stopped. Troponins normal, CT negative, echo normal LVEF. Norvasc added for BP control. Not seen by Cardiology. Readmitted 12/26/18 with BRBPR in the setting of constipation felt to be a hemorrhoidal bleed. Hbg stable 10.1 coumadin continued.   I last saw the patient 01/01/2019  and ordered 30-day monitor and carotid Dopplers which showed 1 to 39% bilateral ICA stenosis. does not look like the Holter monitor was done.  Since we reduce the Lasix to 20 mg daily because of elevated creatinine of 1.61 her legs started to swell a week ago.  Blood pressure is up today but she just took her medications.  Blood pressure usually after she takes her meds in the afternoon is 132/61.  She has a feeding tube but she eats canned chicken soup diluted in chicken broth on a daily basis.   The patient does not have symptoms concerning for COVID-19 infection (fever, chills, cough, or new shortness of breath).    Past Medical History:  Diagnosis Date   Acute respiratory failure (HCC)    Anemia    previous blood transfusions   Arthritis    "all over"   Asthma    Atrial flutter (HCC)    Bradycardia    requiring previous d/c of BB and reduction of amiodarone   CAD (coronary artery disease)    nonobstructive per notes   Chronic diastolic CHF (congestive heart failure) (HCC)    CKD (chronic kidney disease), stage III    Complication of blood transfusion    "got the wrong blood type at [facility] in ~ 2015; no adverse reaction that we are aware of"/daughter, [name] (01/27/2016)   COPD (chronic obstructive pulmonary disease) (HCC)    Depression    "light case"   DVT (deep venous thrombosis) (HCC) 01/2016   a. LLE DVT 01/2016 - switched from Eliquis to Coumadin.   Dyspnea    with some activity   Gastric stenosis    a. s/p stomach tube  GERD (gastroesophageal reflux disease)    History of blood transfusion    "several" (01/27/2016)   History of stomach ulcers    Hyperlipidemia    Hypertension    Hypothyroidism    On home oxygen therapy    "2L; 9PM til 9AM" (06/27/2017)   PAD (peripheral artery disease) (HCC)    a. prior gangrene, toe amputations, intervention.   PAF (paroxysmal atrial fibrillation) (HCC)    Paraesophageal hernia    Perforated  gastric ulcer (HCC)    Pneumonia    "a few times" (06/27/2017)   Seasonal allergies    SIADH (syndrome of inappropriate ADH production) (HCC)    Shannon [name] 01/10/2015   Small bowel obstruction (HCC)    "I don't know how many" (01/11/2015)   Stroke (HCC)    several- no residual   Type II diabetes mellitus (HCC)    "related to prednisone use  for > 20 yr; once predinose stopped; no more DM RX" (01/27/2016)   UTI (urinary tract infection) 02/08/2016   Ventral hernia with bowel obstruction    Past Surgical History:  Procedure Laterality Date   ABDOMINAL AORTOGRAM N/A 09/21/2016   Procedure: Abdominal Aortogram;  Surgeon: Maeola Harman, MD;  Location: [facility] INVASIVE CV LAB;  Service: Cardiovascular;  Laterality: N/A;   AMPUTATION Left 09/25/2016   Procedure: AMPUTATION DIGIT- LEFT 2ND AND 3RD TOES;  Surgeon: Larina Earthly, MD;  Location: [facility] OR;  Service: Vascular;  Laterality: Left;   AMPUTATION Right 05/03/2018   Procedure: AMPUTATION RIGHT GREAT TOE;  Surgeon: Larina Earthly, MD;  Location: [facility] OR;  Service: Vascular;  Laterality: Right;   CATARACT EXTRACTION W/ INTRAOCULAR LENS  IMPLANT, BILATERAL     CHOLECYSTECTOMY OPEN     COLECTOMY     ESOPHAGOGASTRODUODENOSCOPY N/A 01/19/2014   Procedure: ESOPHAGOGASTRODUODENOSCOPY (EGD);  Surgeon: Hilarie Fredrickson, MD;  Location: [facility] ENDOSCOPY;  Service: Endoscopy;  Laterality: N/A;   ESOPHAGOGASTRODUODENOSCOPY N/A 01/20/2014   Procedure: ESOPHAGOGASTRODUODENOSCOPY (EGD);  Surgeon: Hilarie Fredrickson, MD;  Location: [facility] ENDOSCOPY;  Service: Endoscopy;  Laterality: N/A;   ESOPHAGOGASTRODUODENOSCOPY N/A 03/19/2014   Procedure: ESOPHAGOGASTRODUODENOSCOPY (EGD);  Surgeon: Rachael Fee, MD;  Location: [facility] ENDOSCOPY;  Service: Endoscopy;  Laterality: N/A;   ESOPHAGOGASTRODUODENOSCOPY N/A 07/08/2015   Procedure: ESOPHAGOGASTRODUODENOSCOPY (EGD);  Surgeon: Sherrilyn Rist, MD;  Location: [facility] ENDOSCOPY;  Service: Endoscopy;  Laterality: N/A;    ESOPHAGOGASTRODUODENOSCOPY (EGD) WITH PROPOFOL N/A 09/15/2015   Procedure: ESOPHAGOGASTRODUODENOSCOPY (EGD) WITH PROPOFOL;  Surgeon: Ruffin Frederick, MD;  Location: [facility] ENDOSCOPY;  Service: Gastroenterology;  Laterality: N/A;   GASTROJEJUNOSTOMY     hx/notes 01/10/2015   GASTROJEJUNOSTOMY N/A 09/23/2015   Procedure: OPEN GASTROJEJUNOSTOMY TUBE PLACEMENT;  Surgeon: De Blanch Kinsinger, MD;  Location: [facility] ORS;  Service: General;  Laterality: N/A;   GLAUCOMA SURGERY Bilateral    HERNIA REPAIR  2015   IR CM INJ ANY COLONIC TUBE W/FLUORO  01/05/2017   IR CM INJ ANY COLONIC TUBE W/FLUORO  06/07/2017   IR CM INJ ANY COLONIC TUBE W/FLUORO  11/05/2017   IR CM INJ ANY COLONIC TUBE W/FLUORO  09/18/2018   IR GENERIC HISTORICAL  01/07/2016   IR GJ TUBE CHANGE 01/07/2016 Malachy Moan, MD [facility]   IR GENERIC HISTORICAL  01/27/2016   IR MECH REMOV OBSTRUC MAT ANY COLON TUBE W/FLUORO 01/27/2016 Richarda Overlie, MD [facility]   IR GENERIC HISTORICAL  02/07/2016   IR PATIENT EVAL TECH 0-60 MINS Darrell K Allred, PA-C [facility]   IR GENERIC  HISTORICAL  02/08/2016   IR GJ TUBE CHANGE 02/08/2016 Berdine Dance, MD [facility]   IR GENERIC HISTORICAL  01/06/2016   IR GJ TUBE CHANGE 01/06/2016 [facility] OUT REF   IR GENERIC HISTORICAL  05/02/2016   IR CM INJ ANY COLONIC TUBE W/FLUORO 05/02/2016 Oley Balm, MD [facility]   IR GENERIC HISTORICAL  05/15/2016   IR GJ TUBE CHANGE 05/15/2016 Simonne Come, MD [facility]   IR GENERIC HISTORICAL  06/28/2016   IR GJ TUBE CHANGE 06/28/2016 [facility]   IR GJ TUBE CHANGE  02/20/2017   IR GJ TUBE CHANGE  05/10/2017   IR GJ TUBE CHANGE  07/06/2017   IR GJ TUBE CHANGE  08/02/2017   IR GJ TUBE CHANGE  10/15/2017   IR GJ TUBE CHANGE  12/26/2017   IR GJ TUBE CHANGE  04/08/2018   IR GJ TUBE CHANGE  06/19/2018   IR PATIENT EVAL TECH 0-60 MINS  10/19/2016   IR PATIENT EVAL TECH 0-60 MINS  12/25/2016   IR PATIENT EVAL TECH 0-60 MINS  01/29/2017   IR  PATIENT EVAL TECH 0-60 MINS  04/04/2017   IR PATIENT EVAL TECH 0-60 MINS  04/30/2017   IR PATIENT EVAL TECH 0-60 MINS  07/31/2017   IR REPLC DUODEN/JEJUNO TUBE PERCUT W/FLUORO  11/14/2016   LAPAROTOMY N/A 01/20/2015   Procedure: EXPLORATORY LAPAROTOMY;  Surgeon: Abigail Miyamoto, MD;  Location: [facility] OR;  Service: General;  Laterality: N/A;   LOWER EXTREMITY ANGIOGRAPHY Left 09/21/2016   Procedure: Lower Extremity Angiography;  Surgeon: Maeola Harman, MD;  Location: [facility] INVASIVE CV LAB;  Service: Cardiovascular;  Laterality: Left;   LOWER EXTREMITY ANGIOGRAPHY Right 06/27/2017   Procedure: Lower Extremity Angiography;  Surgeon: Maeola Harman, MD;  Location: [facility] INVASIVE CV LAB;  Service: Cardiovascular;  Laterality: Right;   LYSIS OF ADHESION N/A 01/20/2015   Procedure: LYSIS OF ADHESIONS < 1 HOUR;  Surgeon: Abigail Miyamoto, MD;  Location: [facility] OR;  Service: General;  Laterality: N/A;   PERIPHERAL VASCULAR BALLOON ANGIOPLASTY Left 09/21/2016   Procedure: Peripheral Vascular Balloon Angioplasty;  Surgeon: Maeola Harman, MD;  Location: [facility] INVASIVE CV LAB;  Service: Cardiovascular;  Laterality: Left;  drug coated balloon   PERIPHERAL VASCULAR BALLOON ANGIOPLASTY Right 06/27/2017   Procedure: PERIPHERAL VASCULAR BALLOON ANGIOPLASTY;  Surgeon: Maeola Harman, MD;  Location: [facility] INVASIVE CV LAB;  Service: Cardiovascular;  Laterality: Right;  SFA/[id number]   TONSILLECTOMY     TUBAL LIGATION     VENTRAL HERNIA REPAIR  2015   incarcerated ventral hernia ([facility] 09/2013)/notes 01/10/2015     Current Meds  Medication Sig   acetaminophen (TYLENOL) 500 MG tablet Take 500 mg by mouth every 6 (six) hours as needed (pain).    Amino Acids-Protein Hydrolys (FEEDING SUPPLEMENT, PRO-STAT SUGAR FREE 64,) LIQD Take 30 mLs by mouth 2 (two) times daily.   amLODipine (NORVASC) 5 MG tablet Take 1 tablet (5 mg total) by mouth daily.   atorvastatin (LIPITOR) 10 MG tablet Take 1  tablet (10 mg total) by mouth daily.   b complex vitamins tablet Take 1 tablet by mouth daily.    calcium-vitamin D (OSCAL WITH D) 500-200 MG-UNIT per tablet Take 1 tablet by mouth daily with breakfast.    Carboxymethylcellulose Sodium (ARTIFICIAL TEARS OP) Place 1 drop into both eyes 4 (four) times daily.   collagenase (SANTYL) ointment Apply 1 application topically daily as needed (wound care).    cycloSPORINE (RESTASIS) 0.05 % ophthalmic emulsion USE 1 DROP INTO  BOTH EYES TWICE DAILY   DULERA 200-5 MCG/ACT AERO TAKE TWO PUFFS BY MOUTH TWICE DAILY   feeding supplement (OSMOLITE 1 CAL) LIQD Take 711 mLs by mouth See admin instructions. For 12 hours 2100 - 0900   ferrous sulfate 325 (65 FE) MG tablet TAKE 1 TABLET BY MOUTH EVERY DAY   fluticasone (FLONASE) 50 MCG/ACT nasal spray SPRAY 2 SPRAYS INTO EACH NOSTRIL EVERY DAY   furosemide (LASIX) 40 MG tablet Alternate taking 40 mg and 20 mg every other day   gabapentin (NEURONTIN) 300 MG capsule TAKE 1 CAPSULE BY MOUTH TWICE A DAY   gentamicin cream (GARAMYCIN) 0.1 % Apply 1 application topically 3 (three) times daily as needed (dry skin).    hydrALAZINE (APRESOLINE) 50 MG tablet TAKE 1 TABLET BY MOUTH THREE TIMES A DAY   insulin aspart (NOVOLOG) 100 UNIT/ML FlexPen Inject 0-6 Units into the skin 3 (three) times daily with meals. New sliding Scale If BS is 151 - 251 = 2 units,  If BS is 251 -  350 = 4 units,  If BS is 351 - 450 = 6 units Call provider if BS runs more than 400   insulin glargine (LANTUS) 100 UNIT/ML injection Inject 0.08 mLs (8 Units total) into the skin at bedtime.   Insulin Pen Needle (B-D UF III MINI PEN NEEDLES) 31G X 5 MM MISC Use twice daily with giving insulin injections. Dx E11.9   ipratropium-albuterol (DUONEB) 0.5-2.5 (3) MG/3ML SOLN Take 3 mLs by nebulization every 6 (six) hours as needed (sob). Use 3 times daily x 4 days then every 6 hours as needed.   isosorbide mononitrate (IMDUR) 30 MG 24 hr tablet TAKE  2 TABLETS BY MOUTH DAILY. PLEASE MAKE ANNUAL APPT WITH DR. Delton See FOR FUTURE REFILLS. THANK YOU. 1ST ATTEMPT.   latanoprost (XALATAN) 0.005 % ophthalmic solution Place 1 drop into both eyes at bedtime.   levalbuterol (XOPENEX HFA) 45 MCG/ACT inhaler Inhale 2 puffs into the lungs every 6 (six) hours as needed for wheezing or shortness of breath.   levothyroxine (SYNTHROID, LEVOTHROID) 125 MCG tablet Take 1 tablet (125 mcg total) by mouth daily.   loratadine (CLARITIN) 10 MG tablet Take 10 mg by mouth daily as needed for allergies.   Multiple Vitamin (MULTIVITAMIN WITH MINERALS) TABS tablet Take 1 tablet by mouth daily.   nystatin (MYCOSTATIN/NYSTOP) powder Apply 1 g topically as needed (skin irritation).    oxyCODONE (ROXICODONE) 5 MG immediate release tablet Take 1 tablet (5 mg total) by mouth every 4 (four) hours as needed for severe pain. May take 1/2 tab q4hrs as needed for mild pain.   OXYGEN Inhale 2 L into the lungs at bedtime.   pantoprazole (PROTONIX) 40 MG tablet TAKE 1 TABLET BY MOUTH TWICE A DAY   sertraline (ZOLOFT) 50 MG tablet Take 1 tablet (50 mg total) by mouth daily.   warfarin (COUMADIN) 2.5 MG tablet TAKE AS DIRECTED BY COUMADIN CLINIC. (Patient taking differently: Take 5-7.5 mg by mouth See admin instructions. Take 5mg  by mouth daily, except on Sunday, take 7.5mg  by mouth)   Water For Irrigation, Sterile (FREE WATER) SOLN Place 150 mLs into feeding tube 3 (three) times daily.    White Petrolatum-Mineral Oil (SYSTANE NIGHTTIME) OINT Place 1 application into both eyes at bedtime.    [DISCONTINUED] furosemide (LASIX) 40 MG tablet Take 0.5 tablets (20 mg total) by mouth daily. May take an extra 1/2 tablet as needed for swelling, weight gain, or shortness of breath  Allergies:   Penicillins   Social History   Tobacco Use   Smoking status: Never Smoker   Smokeless tobacco: Former Neurosurgeon    Types: Snuff   Tobacco comment: "used snuff in my younger days"    Substance Use Topics   Alcohol use: Never    Alcohol/week: 0.0 standard drinks    Frequency: Never   Drug use: Never     Family Hx: The patient's family history includes Diabetes in her brother; Glaucoma in her son and son; Hypertension in her mother; Stroke in her mother. There is no history of Heart attack, Colon cancer, or Stomach cancer.  ROS:   Please see the history of present illness.      All other systems reviewed and are negative.   Prior CV studies:   The following studies were reviewed today:  Carotid Dopplers 10/7/2020Summary: Right Carotid: Velocities in the right ICA are consistent with a 1-39% stenosis.                Non-hemodynamically significant plaque <50% noted in the CCA.   Left Carotid: Velocities in the left ICA are consistent with a 1-39% stenosis.               Non-hemodynamically significant plaque <50% noted in the CCA.   Vertebrals:  Bilateral vertebral arteries demonstrate antegrade flow. Subclavians: Normal flow hemodynamics were seen in bilateral subclavian              arteries.   *See table(s) above for measurements and observations.       Labs/Other Tests and Data Reviewed:    EKG:  No ECG reviewed.  Recent Labs: 10/15/2018: TSH 0.98 11/25/2018: B Natriuretic Peptide 233.9 12/26/2018: ALT 19 01/01/2019: Hemoglobin 9.6; Platelets 243 01/15/2019: BUN 58; Creatinine, Ser 1.43; Potassium 4.9; Sodium 137   Recent Lipid Panel Lab Results  Component Value Date/Time   CHOL 131 10/15/2018 11:22 AM   CHOL 126 04/07/2015 10:15 AM   TRIG 188 (H) 10/15/2018 11:22 AM   HDL 47 (L) 10/15/2018 11:22 AM   HDL 69 04/07/2015 10:15 AM   CHOLHDL 2.8 10/15/2018 11:22 AM   LDLCALC 58 10/15/2018 11:22 AM    Wt Readings from Last 3 Encounters:  02/19/19 143 lb (64.9 kg)  02/12/19 146 lb (66.2 kg)  01/01/19 143 lb 6.4 oz (65 kg)     Objective:    Vital Signs:  BP (!) 165/74    Pulse 75    Ht 5\' 6"  (1.676 m)    Wt 143 lb (64.9 kg)    BMI 23.08  kg/m    VITAL SIGNS:  reviewed  ASSESSMENT & PLAN:    1. Paroxysmal atrial fibrillation/flutter with syncope 11/2018 felt secondary to bradycardia.  Coreg was stopped.  Carotid Dopplers stable.  30-day monitor just returned on Monday so results not back yet.  No further syncope.   2. GI bleed felt secondary to hemorrhoids 12/2018 Coumadin continued.  Hemoglobin has been stable-9.6 on 01/01/2019. 3. Essential hypertension amlodipine added when Coreg was stopped as patient to take her blood pressure medication early in the morning. 4. Acute on chronic diastolic CHF normal LVEF with diastolic dysfunction on echo 11/2018.  Patient has leg edema and wheezing.  Eating canned soup and broth daily as she has a feeding tube.  2 g sodium diet gone over in detail.  We will try Lasix 20 mg alternating with 40 mg every other day.  Have home health check be met in 2 weeks  and follow-up with me.  COVID-19 Education: The signs and symptoms of COVID-19 were discussed with the patient and how to seek care for testing (follow up with PCP or arrange E-visit).  The importance of social distancing was discussed today.  Time:   Today, I have spent 19 minutes with the patient with telehealth technology discussing the above problems.     Medication Adjustments/Labs and Tests Ordered: Current medicines are reviewed at length with the patient today.  Concerns regarding medicines are outlined above.   Tests Ordered: No orders of the defined types were placed in this encounter.   Medication Changes: Meds ordered this encounter  Medications   furosemide (LASIX) 40 MG tablet    Sig: Alternate taking 40 mg and 20 mg every other day    Dispense:  90 tablet    Refill:  3    Follow Up:  Virtual Visit  in 2 week(s) Jacolyn Reedy, PA-C  Signed, Jacolyn Reedy, PA-C  02/19/2019 1:39 PM    [facility]

## 2019-02-18 NOTE — Telephone Encounter (Signed)
We have attempted to contact pt's daughter several times with no success or call back from her. Per triage protocol, message will be closed.

## 2019-02-19 ENCOUNTER — Encounter: Payer: Self-pay | Admitting: Physician Assistant

## 2019-02-19 ENCOUNTER — Telehealth: Payer: Self-pay

## 2019-02-19 ENCOUNTER — Telehealth (INDEPENDENT_AMBULATORY_CARE_PROVIDER_SITE_OTHER): Payer: Medicare Other | Admitting: Physician Assistant

## 2019-02-19 ENCOUNTER — Other Ambulatory Visit: Payer: Self-pay

## 2019-02-19 VITALS — BP 165/74 | HR 75 | Ht 66.0 in | Wt 143.0 lb

## 2019-02-19 DIAGNOSIS — I480 Paroxysmal atrial fibrillation: Secondary | ICD-10-CM

## 2019-02-19 DIAGNOSIS — I779 Disorder of arteries and arterioles, unspecified: Secondary | ICD-10-CM

## 2019-02-19 DIAGNOSIS — I110 Hypertensive heart disease with heart failure: Secondary | ICD-10-CM | POA: Diagnosis not present

## 2019-02-19 DIAGNOSIS — I5033 Acute on chronic diastolic (congestive) heart failure: Secondary | ICD-10-CM | POA: Diagnosis not present

## 2019-02-19 DIAGNOSIS — K922 Gastrointestinal hemorrhage, unspecified: Secondary | ICD-10-CM

## 2019-02-19 DIAGNOSIS — I10 Essential (primary) hypertension: Secondary | ICD-10-CM

## 2019-02-19 MED ORDER — FUROSEMIDE 40 MG PO TABS: ORAL_TABLET | ORAL | 3 refills | Status: DC

## 2019-02-19 NOTE — Patient Instructions (Signed)
Medication Instructions:  Your physician has recommended you make the following change in your medication:   CHANGE: furosemide (lasix) 40 mg tablet: Alternate taking 1 tablet (40 mg) and 1/2 tablet (20 mg) every other day  If you need a refill on your cardiac medications before your next appointment, please call your pharmacy.   Lab work: We will arrange for Home Health to come out and check labs (BMET) in 2 weeks  If you have labs (blood work) drawn today and your tests are completely normal, you will receive your results only by: [name] MyChart Message (if you have MyChart) OR . A paper copy in the mail If you have any lab test that is abnormal or we need to change your treatment, we will call you to review the results.  Testing/Procedures: None ordered  Follow-Up: Follow up with Ermalinda Barrios, PA via VIRTUAL Visit in 2 weeks  Any Other Special Instructions Will Be Listed Below (If Applicable).  Two Gram Sodium Diet 2000 mg  What is Sodium? Sodium is a mineral found naturally in many foods. The most significant source of sodium in the diet is table salt, which is about 40% sodium.  Processed, convenience, and preserved foods also contain a large amount of sodium.  The body needs only 500 mg of sodium daily to function,  A normal diet provides more than enough sodium even if you do not use salt.  Why Limit Sodium? A build up of sodium in the body can cause thirst, increased blood pressure, shortness of breath, and water retention.  Decreasing sodium in the diet can reduce edema and risk of heart attack or stroke associated with high blood pressure.  Keep in mind that there are many other factors involved in these health problems.  Heredity, obesity, lack of exercise, cigarette smoking, stress and what you eat all play a role.  General Guidelines:  Do not add salt at the table or in cooking.  One teaspoon of salt contains over 2 grams of sodium.  Read food labels  Avoid processed and  convenience foods  Ask your dietitian before eating any foods not dicussed in the menu planning guidelines  Consult your physician if you wish to use a salt substitute or a sodium containing medication such as antacids.  Limit milk and milk products to 16 oz (2 cups) per day.  Shopping Hints:  READ LABELS!! "Dietetic" does not necessarily mean low sodium.  Salt and other sodium ingredients are often added to foods during processing.   Menu Planning Guidelines Food Group Choose More Often Avoid  Beverages (see also the milk group All fruit juices, low-sodium, salt-free vegetables juices, low-sodium carbonated beverages Regular vegetable or tomato juices, commercially softened water used for drinking or cooking  Breads and Cereals Enriched white, wheat, rye and pumpernickel bread, hard rolls and dinner rolls; muffins, cornbread and waffles; most dry cereals, cooked cereal without added salt; unsalted crackers and breadsticks; low sodium or homemade bread crumbs Bread, rolls and crackers with salted tops; quick breads; instant hot cereals; pancakes; commercial bread stuffing; self-rising flower and biscuit mixes; regular bread crumbs or cracker crumbs  Desserts and Sweets Desserts and sweets mad with mild should be within allowance Instant pudding mixes and cake mixes  Fats Butter or margarine; vegetable oils; unsalted salad dressings, regular salad dressings limited to 1 Tbs; light, sour and heavy cream Regular salad dressings containing bacon fat, bacon bits, and salt pork; snack dips made with instant soup mixes or processed cheese; salted  nuts  Fruits Most fresh, frozen and canned fruits Fruits processed with salt or sodium-containing ingredient (some dried fruits are processed with sodium sulfites        Vegetables Fresh, frozen vegetables and low- sodium canned vegetables Regular canned vegetables, sauerkraut, pickled vegetables, and others prepared in brine; frozen vegetables in sauces;  vegetables seasoned with ham, bacon or salt pork  Condiments, Sauces, Miscellaneous  Salt substitute with physician's approval; pepper, herbs, spices; vinegar, lemon or lime juice; hot pepper sauce; garlic powder, onion powder, low sodium soy sauce (1 Tbs.); low sodium condiments (ketchup, chili sauce, mustard) in limited amounts (1 tsp.) fresh ground horseradish; unsalted tortilla chips, pretzels, potato chips, popcorn, salsa (1/4 cup) Any seasoning made with salt including garlic salt, celery salt, onion salt, and seasoned salt; sea salt, rock salt, kosher salt; meat tenderizers; monosodium glutamate; mustard, regular soy sauce, barbecue, sauce, chili sauce, teriyaki sauce, steak sauce, Worcestershire sauce, and most flavored vinegars; canned gravy and mixes; regular condiments; salted snack foods, olives, picles, relish, horseradish sauce, catsup   Food preparation: Try these seasonings Meats:    Pork Sage, onion Serve with applesauce  Chicken Poultry seasoning, thyme, parsley Serve with cranberry sauce  Lamb Curry powder, rosemary, garlic, thyme Serve with mint sauce or jelly  Veal Marjoram, basil Serve with current jelly, cranberry sauce  Beef Pepper, bay leaf Serve with dry mustard, unsalted chive butter  Fish Bay leaf, dill Serve with unsalted lemon butter, unsalted parsley butter  Vegetables:    Asparagus Lemon juice   Broccoli Lemon juice   Carrots Mustard dressing parsley, mint, nutmeg, glazed with unsalted butter and sugar   Green beans Marjoram, lemon juice, nutmeg,dill seed   Tomatoes Basil, marjoram, onion   Spice /blend for [employer]" 4 tsp ground thyme 1 tsp ground sage 3 tsp ground rosemary 4 tsp ground marjoram   Test your knowledge 1. A product that says "Salt Free" may still contain sodium. True or False 2. Garlic Powder and Hot Pepper Sauce an be used as alternative seasonings.True or False 3. Processed foods have more sodium than fresh foods.  True or  False 4. Canned Vegetables have less sodium than froze True or False  WAYS TO DECREASE YOUR SODIUM INTAKE 1. Avoid the use of added salt in cooking and at the table.  Table salt (and other prepared seasonings which contain salt) is probably one of the greatest sources of sodium in the diet.  Unsalted foods can gain flavor from the sweet, sour, and butter taste sensations of herbs and spices.  Instead of using salt for seasoning, try the following seasonings with the foods listed.  Remember: how you use them to enhance natural food flavors is limited only by your creativity... Allspice-Meat, fish, eggs, fruit, peas, red and yellow vegetables Almond Extract-Fruit baked goods Anise Seed-Sweet breads, fruit, carrots, beets, cottage cheese, cookies (tastes like licorice) Basil-Meat, fish, eggs, vegetables, rice, vegetables salads, soups, sauces Bay Leaf-Meat, fish, stews, poultry Burnet-Salad, vegetables (cucumber-like flavor) Caraway Seed-Bread, cookies, cottage cheese, meat, vegetables, cheese, rice Cardamon-Baked goods, fruit, soups Celery Powder or seed-Salads, salad dressings, sauces, meatloaf, soup, bread.Do not use  celery salt Chervil-Meats, salads, fish, eggs, vegetables, cottage cheese (parsley-like flavor) Chili Power-Meatloaf, chicken cheese, corn, eggplant, egg dishes Chives-Salads cottage cheese, egg dishes, soups, vegetables, sauces Cilantro-Salsa, casseroles Cinnamon-Baked goods, fruit, pork, lamb, chicken, carrots Cloves-Fruit, baked goods, fish, pot roast, green beans, beets, carrots Coriander-Pastry, cookies, meat, salads, cheese (lemon-orange flavor) Cumin-Meatloaf, fish,cheese, eggs, cabbage,fruit pie (caraway flavor) Andria Rhein,  fruit, eggs, fish, poultry, cottage cheese, vegetables Dill Seed-Meat, cottage cheese, poultry, vegetables, fish, salads, bread Fennel Seed-Bread, cookies, apples, pork, eggs, fish, beets, cabbage, cheese, Licorice-like flavor Garlic-(buds or  powder) Salads, meat, poultry, fish, bread, butter, vegetables, potatoes.Do not  use garlic salt Ginger-Fruit, vegetables, baked goods, meat, fish, poultry Horseradish Root-Meet, vegetables, butter Lemon Juice or Extract-Vegetables, fruit, tea, baked goods, fish salads Mace-Baked goods fruit, vegetables, fish, poultry (taste like nutmeg) Maple Extract-Syrups Marjoram-Meat, chicken, fish, vegetables, breads, green salads (taste like Sage) Mint-Tea, lamb, sherbet, vegetables, desserts, carrots, cabbage Mustard, Dry or Seed-Cheese, eggs, meats, vegetables, poultry Nutmeg-Baked goods, fruit, chicken, eggs, vegetables, desserts Onion Powder-Meat, fish, poultry, vegetables, cheese, eggs, bread, rice salads (Do not use   Onion salt) Orange Extract-Desserts, baked goods Oregano-Pasta, eggs, cheese, onions, pork, lamb, fish, chicken, vegetables, green salads Paprika-Meat, fish, poultry, eggs, cheese, vegetables Parsley Flakes-Butter, vegetables, meat fish, poultry, eggs, bread, salads (certain forms may   Contain sodium Pepper-Meat fish, poultry, vegetables, eggs Peppermint Extract-Desserts, baked goods Poppy Seed-Eggs, bread, cheese, fruit dressings, baked goods, noodles, vegetables, cottage  [employer], poultry, meat, fish, cauliflower, turnips,eggs bread Saffron-Rice, bread, veal, chicken, fish, eggs Sage-Meat, fish, poultry, onions, eggplant, tomateos, pork, stews Savory-Eggs, salads, poultry, meat, rice, vegetables, soups, pork Tarragon-Meat, poultry, fish, eggs, butter, vegetables (licorice-like flavor)  Thyme-Meat, poultry, fish, eggs, vegetables, (clover-like flavor), sauces, soups Tumeric-Salads, butter, eggs, fish, rice, vegetables (saffron-like flavor) Vanilla Extract-Baked goods, candy Vinegar-Salads, vegetables, meat marinades Walnut Extract-baked goods, candy  2. Choose your Foods Wisely   The following is a list of foods to avoid which  are high in sodium:  Meats-Avoid all smoked, canned, salt cured, dried and kosher meat and fish as well as Anchovies   [employer] meats:Bologna, Liverwurst, Pastrami Canned meat or fish  Marinated herring Caviar    Pepperoni Corned Beef   Pizza Dried chipped beef  Salami Frozen breaded fish or meat Salt pork Frankfurters or hot dogs  Sardines Gefilte fish   Sausage Ham (boiled ham, Proscuitto Smoked butt    spiced ham)   Spam      TV Dinners Vegetables Canned vegetables (Regular) Relish Canned mushrooms  Sauerkraut Olives    Tomato juice Pickles  Bakery and Dessert Products Canned puddings  Cream pies Cheesecake   Decorated cakes Cookies  Beverages/Juices Tomato juice, regular  Gatorade   V-8 vegetable juice, regular  Breads and Cereals Biscuit mixes   Salted potato chips, corn chips, pretzels Bread stuffing mixes  Salted crackers and rolls Pancake and waffle mixes Self-rising flour  Seasonings Accent    Meat sauces Barbecue sauce  Meat tenderizer Catsup    Monosodium glutamate (MSG) Celery salt   Onion salt Chili sauce   Prepared mustard Garlic salt   Salt, seasoned salt, sea salt Gravy mixes   Soy sauce Horseradish   Steak sauce Ketchup   Tartar sauce Lite salt    Teriyaki sauce Marinade mixes   Worcestershire sauce  Others Baking powder   Cocoa and cocoa mixes Baking soda   Commercial casserole mixes Candy-caramels, chocolate  Dehydrated soups    Bars, fudge,nougats  Instant rice and pasta mixes Canned broth or soup  Maraschino cherries Cheese, aged and processed cheese and cheese spreads  Learning Assessment Quiz  Indicated T (for True) or F (for False) for each of the following statements:  1. _____ Fresh fruits and vegetables and unprocessed grains are generally low in sodium 2. _____ Water may contain a considerable amount of  sodium, depending on the source 3. _____ You can always tell if a food is high in sodium by tasting  it 4. _____ Certain laxatives my be high in sodium and should be avoided unless prescribed   by a physician or pharmacist 5. _____ Salt substitutes may be used freely by anyone on a sodium restricted diet 6. _____ Sodium is present in table salt, food additives and as a natural component of   most foods 7. _____ Table salt is approximately 90% sodium 8. _____ Limiting sodium intake may help prevent excess fluid accumulation in the body 9. _____ On a sodium-restricted diet, seasonings such as bouillon soy sauce, and    cooking wine should be used in place of table salt 10. _____ On an ingredient list, a product which lists monosodium glutamate as the first   ingredient is an appropriate food to include on a low sodium diet  Circle the best answer(s) to the following statements (Hint: there may be more than one correct answer)  11. On a low-sodium diet, some acceptable snack items are:    A. Olives  F. Bean dip   K. Grapefruit juice    B. Salted Pretzels G. Commercial Popcorn   L. Canned peaches    C. Carrot Sticks  H. Bouillon   M. Unsalted nuts   D. [place] fries  I. Peanut butter crackers N. Salami   E. Sweet pickles J. Tomato Juice   O. Pizza  12.  Seasonings that may be used freely on a reduced - sodium diet include   A. Lemon wedges F.Monosodium glutamate K. Celery seed    B.Soysauce   G. Pepper   L. Mustard powder   C. Sea salt  H. Cooking wine  M. Onion flakes   D. Vinegar  E. Prepared horseradish N. Salsa   E. Sage   J. Worcestershire sauce  O. Chutney

## 2019-02-19 NOTE — Telephone Encounter (Signed)
[facility] Home Visit Initial Request  Date of Request (DOR):  February 19, 2019  Requesting Provider:  Jacolyn Reedy, PA    Agency Requested:    [facility] Contact:  Alcide Clever, NP [address] [place], [place] [address] Phone #:  [phone] Fax #:  [phone]  Patient Demographic Information: Name:  Shannon [name] Age:  85 y.o.   DOB:  1923-04-18  MRN:  [id number]   Address:   [address] [place] [address]   Phone Numbers:   Home Phone [phone]  Mobile [phone]     Emergency Contact Information on File:   Contact Information    Name Relation Home Work Mobile   Armstrong,[name] Daughter [phone]  [phone]   Eufelia, Stepper [phone]     Tonia Ghent [phone]  [phone]   [place], [place] [phone]  [phone]      The above family members may be contacted for information on this patient (review DPR on file):  Yes    Patient Clinical Information:  Primary Care Provider:  Sharon Seller, NP  Primary Cardiologist:  Tobias Alexander, MD  Primary Electrophysiologist:  None   Past Medical Hx: Ms. Ladino  has a past medical history of Acute respiratory failure (HCC), Anemia, Arthritis, Asthma, Atrial flutter (HCC), Bradycardia, CAD (coronary artery disease), Chronic diastolic CHF (congestive heart failure) (HCC), CKD (chronic kidney disease), stage III, Complication of blood transfusion, COPD (chronic obstructive pulmonary disease) (HCC), Depression, DVT (deep venous thrombosis) (HCC) (01/2016), Dyspnea, Gastric stenosis, GERD (gastroesophageal reflux disease), History of blood transfusion, History of stomach ulcers, Hyperlipidemia, Hypertension, Hypothyroidism, On home oxygen therapy, PAD (peripheral artery disease) (HCC), PAF (paroxysmal atrial fibrillation) (HCC), Paraesophageal hernia, Perforated gastric ulcer (HCC), Pneumonia, Seasonal allergies, SIADH (syndrome of  inappropriate ADH production) (HCC), Small bowel obstruction (HCC), Stroke (HCC), Type II diabetes mellitus (HCC), UTI (urinary tract infection) (02/08/2016), and Ventral hernia with bowel obstruction.   Allergies: She is allergic to penicillins.   Medications: Current Outpatient Medications on File Prior to Visit  Medication Sig  . acetaminophen (TYLENOL) 500 MG tablet Take 500 mg by mouth every 6 (six) hours as needed (pain).   . Amino Acids-Protein Hydrolys (FEEDING SUPPLEMENT, PRO-STAT SUGAR FREE 64,) LIQD Take 30 mLs by mouth 2 (two) times daily.  [name] amLODipine (NORVASC) 5 MG tablet Take 1 tablet (5 mg total) by mouth daily.  [name] atorvastatin (LIPITOR) 10 MG tablet Take 1 tablet (10 mg total) by mouth daily.  [name] b complex vitamins tablet Take 1 tablet by mouth daily.   . calcium-vitamin D (OSCAL WITH D) 500-200 MG-UNIT per tablet Take 1 tablet by mouth daily with breakfast.   . Carboxymethylcellulose Sodium (ARTIFICIAL TEARS OP) Place 1 drop into both eyes 4 (four) times daily.  . collagenase (SANTYL) ointment Apply 1 application topically daily as needed (wound care).   . cycloSPORINE (RESTASIS) 0.05 % ophthalmic emulsion USE 1 DROP INTO BOTH EYES TWICE DAILY  . DULERA 200-5 MCG/ACT AERO TAKE TWO PUFFS BY MOUTH TWICE DAILY  . feeding supplement (OSMOLITE 1 CAL) LIQD Take 711 mLs by mouth See admin instructions. For 12 hours 2100 - 0900  . ferrous sulfate 325 (65 FE) MG tablet TAKE 1 TABLET BY MOUTH EVERY DAY  . fluticasone (FLONASE) 50 MCG/ACT nasal spray SPRAY 2 SPRAYS INTO EACH NOSTRIL EVERY DAY  . furosemide (LASIX) 40 MG tablet Alternate taking 40 mg and 20 mg every other day  . gabapentin (NEURONTIN) 300 MG capsule TAKE  1 CAPSULE BY MOUTH TWICE A DAY  . gentamicin cream (GARAMYCIN) 0.1 % Apply 1 application topically 3 (three) times daily as needed (dry skin).   . hydrALAZINE (APRESOLINE) 50 MG tablet TAKE 1 TABLET BY MOUTH THREE TIMES A DAY  . insulin aspart (NOVOLOG) 100 UNIT/ML  FlexPen Inject 0-6 Units into the skin 3 (three) times daily with meals. New sliding Scale If BS is 151 - 251 = 2 units,  If BS is 251 -  350 = 4 units,  If BS is 351 - 450 = 6 units Call provider if BS runs more than 400  . insulin glargine (LANTUS) 100 UNIT/ML injection Inject 0.08 mLs (8 Units total) into the skin at bedtime.  . Insulin Pen Needle (B-D UF III MINI PEN NEEDLES) 31G X 5 MM MISC Use twice daily with giving insulin injections. Dx E11.9  . ipratropium-albuterol (DUONEB) 0.5-2.5 (3) MG/3ML SOLN Take 3 mLs by nebulization every 6 (six) hours as needed (sob). Use 3 times daily x 4 days then every 6 hours as needed.  . isosorbide mononitrate (IMDUR) 30 MG 24 hr tablet TAKE 2 TABLETS BY MOUTH DAILY. PLEASE MAKE ANNUAL APPT WITH DR. Delton See FOR FUTURE REFILLS. THANK YOU. 1ST ATTEMPT.  . latanoprost (XALATAN) 0.005 % ophthalmic solution Place 1 drop into both eyes at bedtime.  . levalbuterol (XOPENEX HFA) 45 MCG/ACT inhaler Inhale 2 puffs into the lungs every 6 (six) hours as needed for wheezing or shortness of breath.  . levothyroxine (SYNTHROID, LEVOTHROID) 125 MCG tablet Take 1 tablet (125 mcg total) by mouth daily.  [name] loratadine (CLARITIN) 10 MG tablet Take 10 mg by mouth daily as needed for allergies.  . Multiple Vitamin (MULTIVITAMIN WITH MINERALS) TABS tablet Take 1 tablet by mouth daily.  [name] nystatin (MYCOSTATIN/NYSTOP) powder Apply 1 g topically as needed (skin irritation).   [name] oxyCODONE (ROXICODONE) 5 MG immediate release tablet Take 1 tablet (5 mg total) by mouth every 4 (four) hours as needed for severe pain. May take 1/2 tab q4hrs as needed for mild pain.  . OXYGEN Inhale 2 L into the lungs at bedtime.  . pantoprazole (PROTONIX) 40 MG tablet TAKE 1 TABLET BY MOUTH TWICE A DAY  . sertraline (ZOLOFT) 50 MG tablet Take 1 tablet (50 mg total) by mouth daily.  [name] warfarin (COUMADIN) 2.5 MG tablet TAKE AS DIRECTED BY COUMADIN CLINIC. (Patient taking differently: Take 5-7.5 mg by mouth See  admin instructions. Take 5mg  by mouth daily, except on Sunday, take 7.5mg  by mouth)  . Water For Irrigation, Sterile (FREE WATER) SOLN Place 150 mLs into feeding tube 3 (three) times daily.   Cliffton Asters Petrolatum-Mineral Oil (SYSTANE NIGHTTIME) OINT Place 1 application into both eyes at bedtime.    No current facility-administered medications on file prior to visit.      Social Hx: She  reports that she has never smoked. She quit smokeless tobacco use about 38 years ago.  Her smokeless tobacco use included snuff. She reports that she does not drink alcohol or use drugs.    Diagnosis/Reason for Visit:   CHF Services Requested:  Vital Signs (BP, Pulse, O2, Weight)  Physical Exam  Labs:  BMET in 2 weeks  # of Visits Needed/Frequency per Week: 1

## 2019-02-20 ENCOUNTER — Other Ambulatory Visit: Payer: Self-pay | Admitting: *Deleted

## 2019-02-20 MED ORDER — LANTUS SOLOSTAR 100 UNIT/ML ~~LOC~~ SOPN: 8.0000 [IU] | PEN_INJECTOR | Freq: Every day | SUBCUTANEOUS | 3 refills | Status: DC

## 2019-02-20 NOTE — Telephone Encounter (Signed)
Patient daughter, [name] called and stated that they use pens and not the vial. Changed in the medication list to Pens. Faxed to pharmacy.

## 2019-02-22 IMAGING — DX DG CHEST 2V
2 series · 2 of 2 positions shown · non-contrast
Comparison: Chest x-ray of 09/17/2016 and 09/14/2016

CLINICAL DATA: Cough, shortness of breath today, history of
pneumonia

EXAM:
CHEST  2 VIEW

[chest lat]
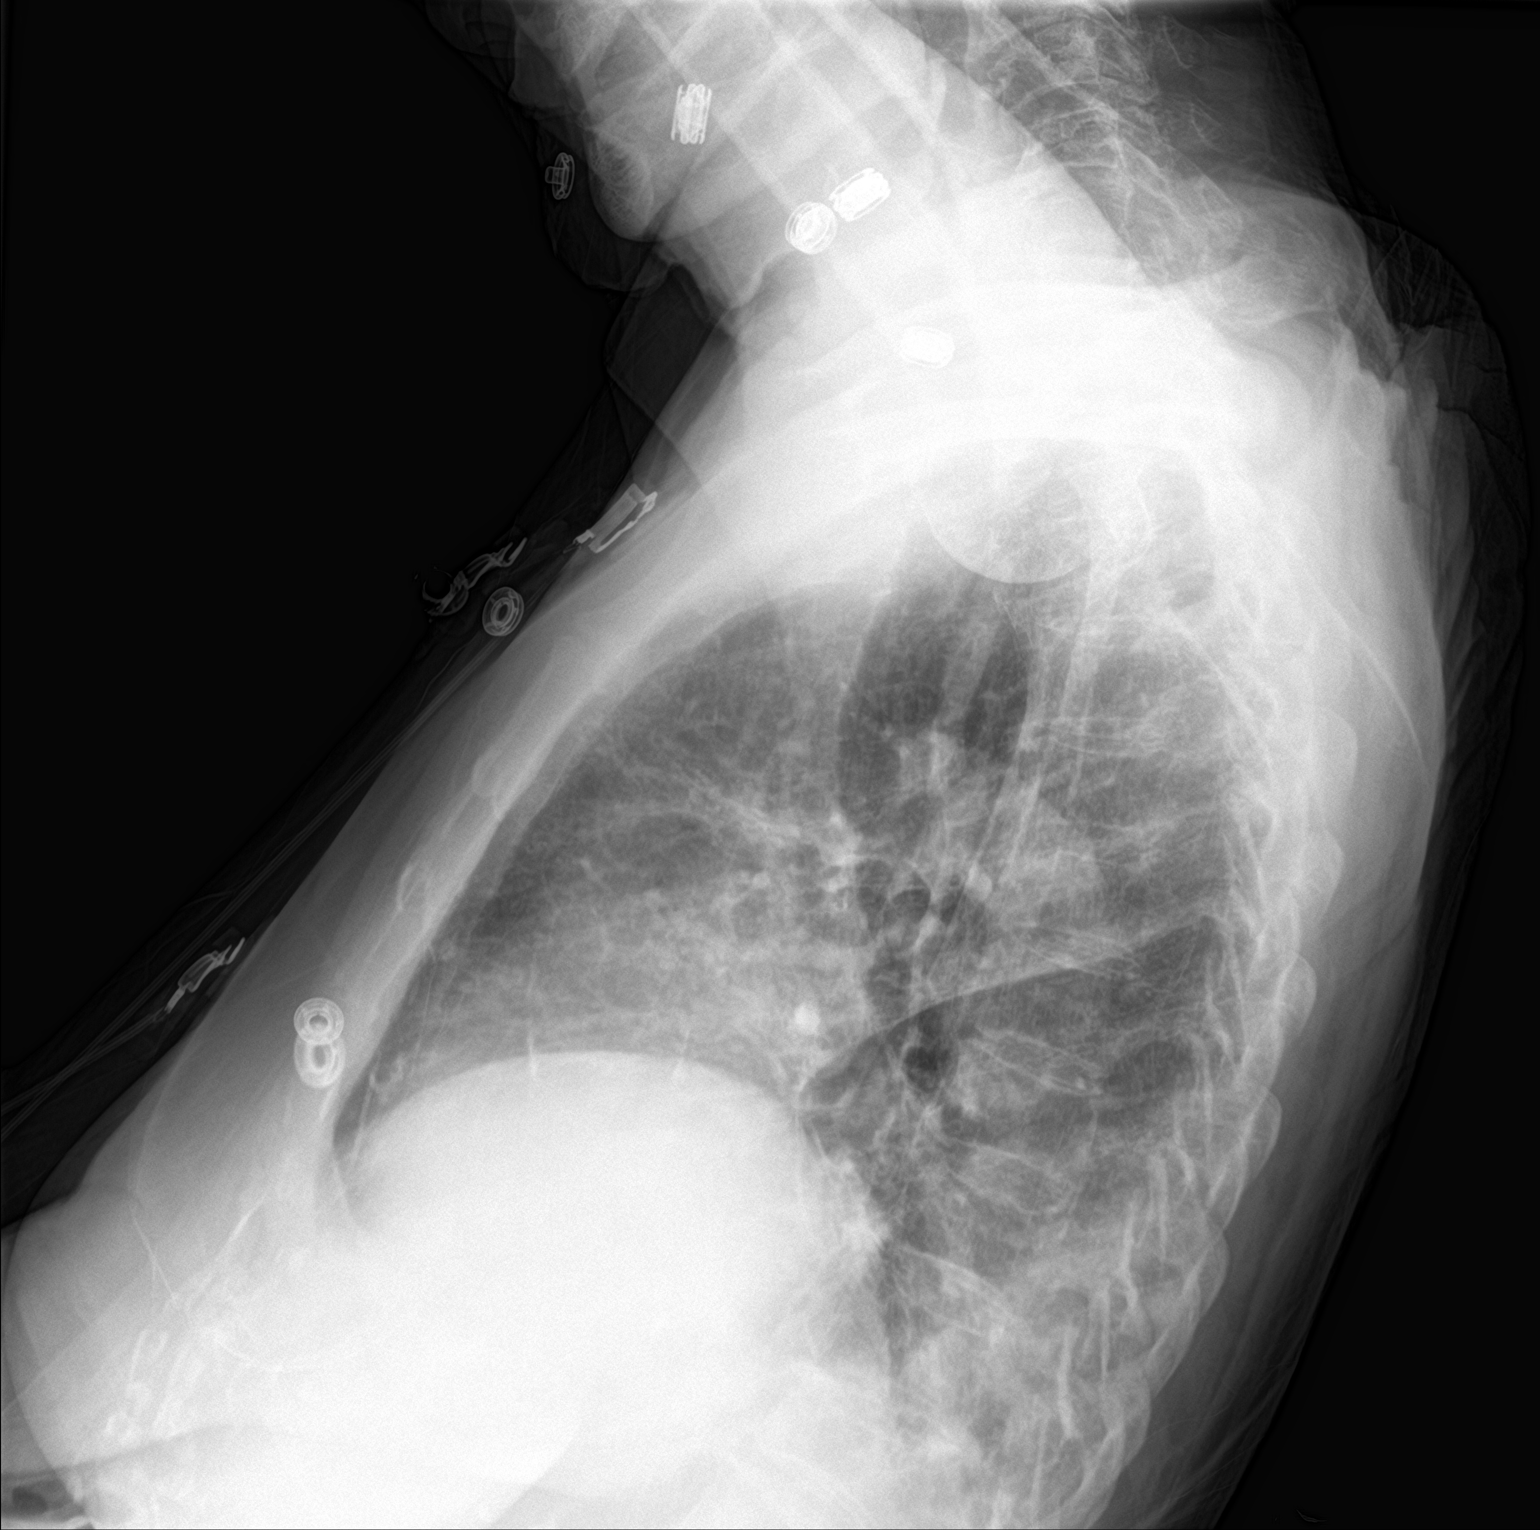

[chest ap]
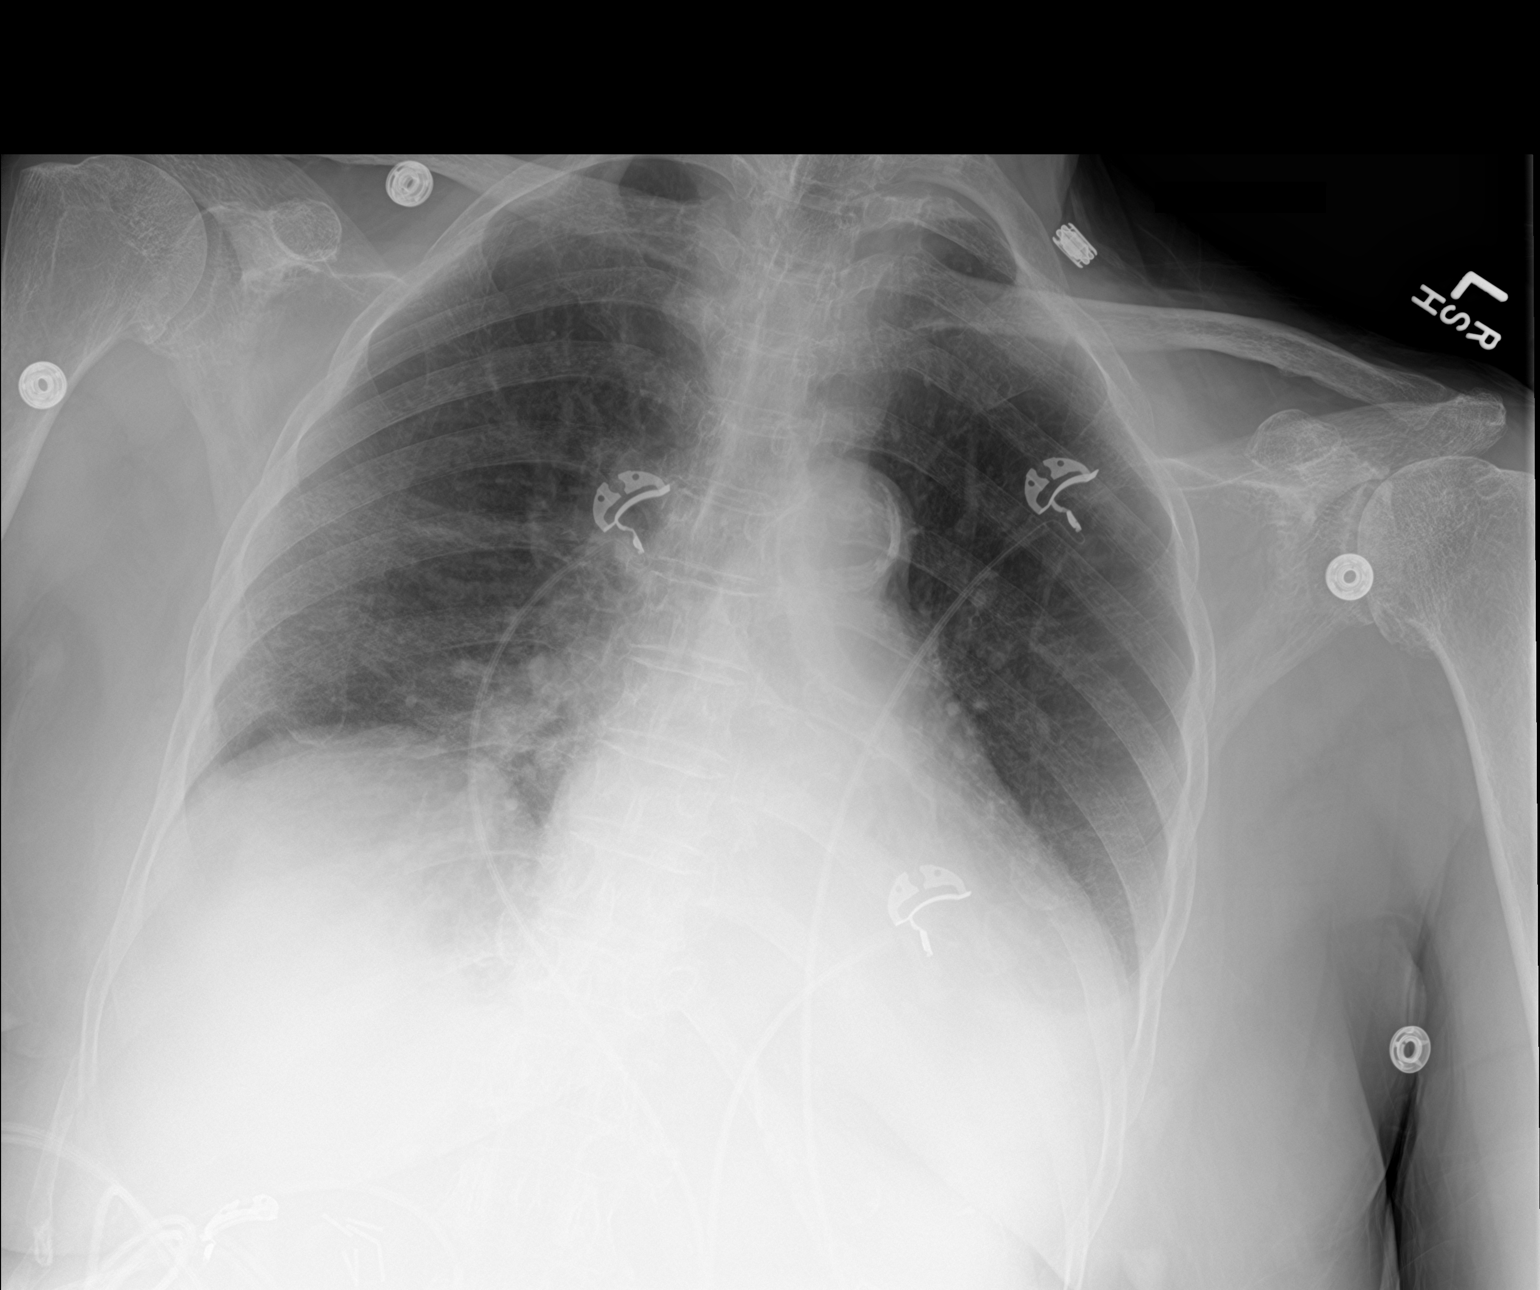

[2 of 2 positions shown; findings below may reference images not displayed]

FINDINGS: There has been improvement in opacity at the left lung base with a
small left pleural effusion and mild left basilar atelectasis
remaining. Vague opacity remains anteriorly on the lateral view
probably in the right upper lobe anteriorly consistent with linear
atelectasis or pneumonia. Cardiomegaly is stable. No acute bony
abnormality is seen.
IMPRESSION: 1. Improved aeration mild left basilar atelectasis and possibly
small left effusion.
2. Mild linear atelectasis or residual pneumonia in the anterior
right upper lobe.

## 2019-02-27 IMAGING — DX DG CHEST 2V
2 series · 2 of 2 positions shown · non-contrast
Comparison: With 09/21/2016 and prior exam

CLINICAL DATA: Cough and shortness of breath. History of pneumonia.

EXAM:
CHEST  2 VIEW

[chest pa]
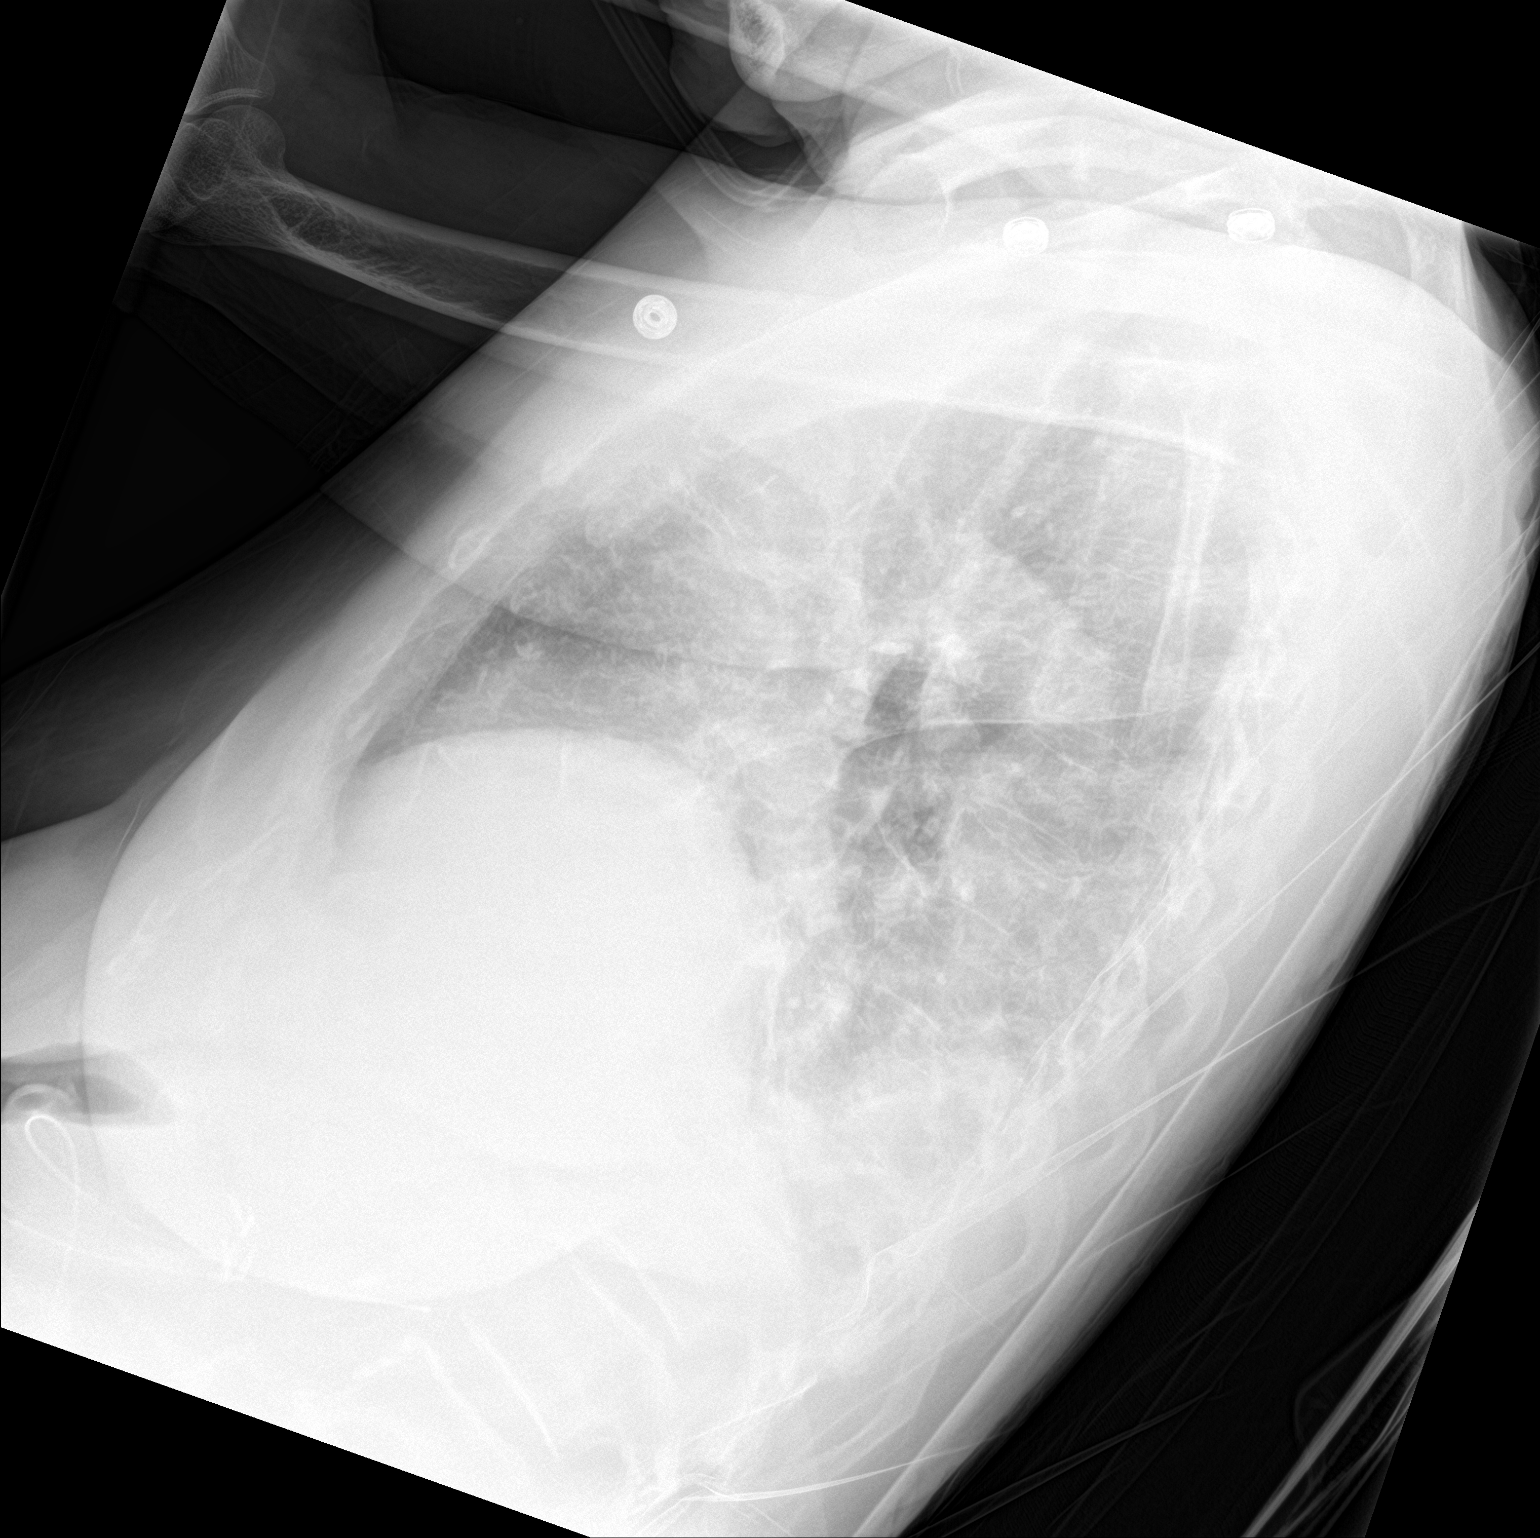

[chest ap]
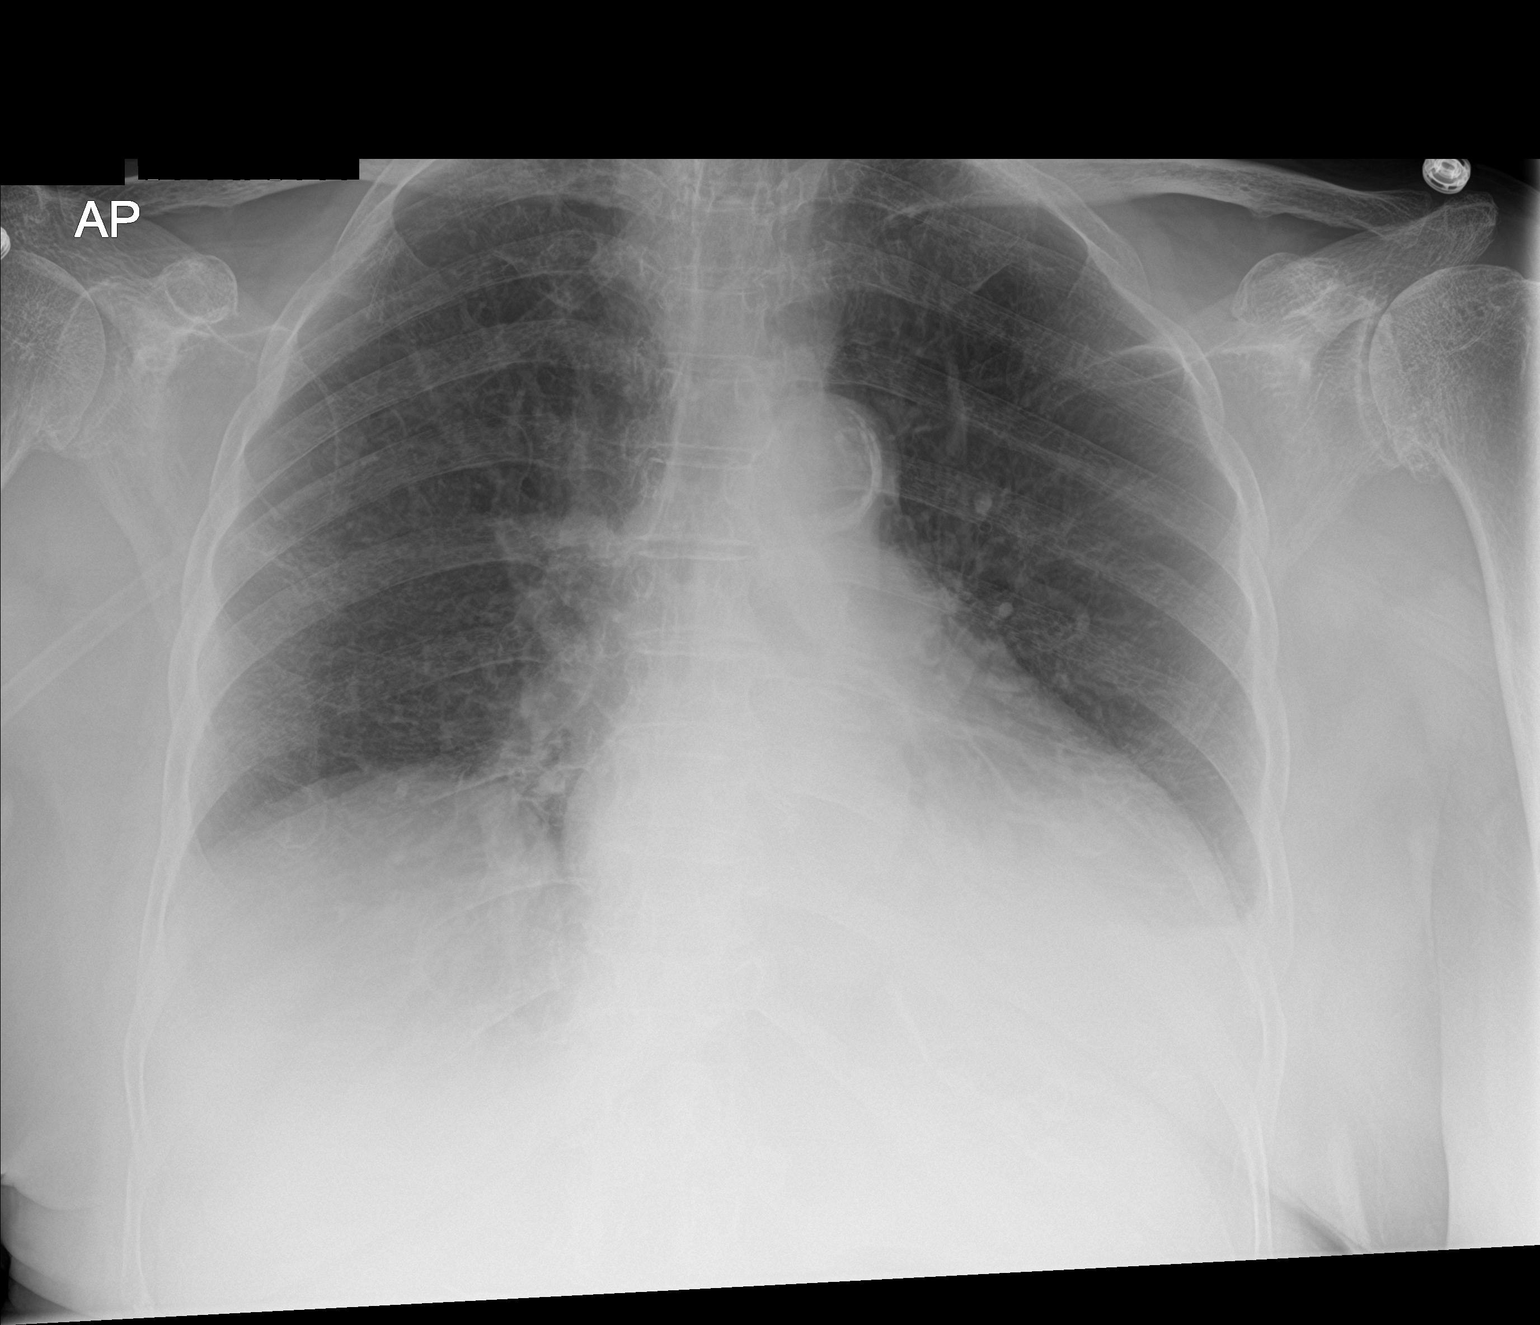

[2 of 2 positions shown; findings below may reference images not displayed]

FINDINGS: Cardiomegaly, small left pleural effusion and left lower lung
consolidation/atelectasis again noted.

Minimal atelectasis within the right anterior lung again noted.
Right basilar atelectasis is unchanged.

There is no evidence of pneumothorax.

No acute bony abnormalities are identified.
IMPRESSION: Unchanged appearance of the chest with small left pleural effusion,
left lower lung consolidation/atelectasis and right lung
atelectasis.

## 2019-03-01 ENCOUNTER — Inpatient Hospital Stay (HOSPITAL_COMMUNITY)
Admission: EM | Admit: 2019-03-01 | Discharge: 2019-03-03 | DRG: 394 | Disposition: A | Discharge status: Home / Self Care | Payer: Medicare Other | Attending: Internal Medicine | Admitting: Internal Medicine

## 2019-03-01 ENCOUNTER — Other Ambulatory Visit: Payer: Self-pay

## 2019-03-01 ENCOUNTER — Encounter (HOSPITAL_COMMUNITY): Payer: Self-pay | Admitting: Emergency Medicine

## 2019-03-01 DIAGNOSIS — E1143 Type 2 diabetes mellitus with diabetic autonomic (poly)neuropathy: Secondary | ICD-10-CM | POA: Diagnosis present

## 2019-03-01 DIAGNOSIS — E039 Hypothyroidism, unspecified: Secondary | ICD-10-CM | POA: Diagnosis present

## 2019-03-01 DIAGNOSIS — I5032 Chronic diastolic (congestive) heart failure: Secondary | ICD-10-CM | POA: Diagnosis present

## 2019-03-01 DIAGNOSIS — Z434 Encounter for attention to other artificial openings of digestive tract: Principal | ICD-10-CM

## 2019-03-01 DIAGNOSIS — R609 Edema, unspecified: Secondary | ICD-10-CM | POA: Diagnosis not present

## 2019-03-01 DIAGNOSIS — I739 Peripheral vascular disease, unspecified: Secondary | ICD-10-CM | POA: Diagnosis present

## 2019-03-01 DIAGNOSIS — F329 Major depressive disorder, single episode, unspecified: Secondary | ICD-10-CM | POA: Diagnosis present

## 2019-03-01 DIAGNOSIS — N1832 Chronic kidney disease, stage 3b: Secondary | ICD-10-CM | POA: Diagnosis present

## 2019-03-01 DIAGNOSIS — N183 Chronic kidney disease, stage 3 unspecified: Secondary | ICD-10-CM | POA: Diagnosis present

## 2019-03-01 DIAGNOSIS — I2510 Atherosclerotic heart disease of native coronary artery without angina pectoris: Secondary | ICD-10-CM | POA: Diagnosis present

## 2019-03-01 DIAGNOSIS — I130 Hypertensive heart and chronic kidney disease with heart failure and stage 1 through stage 4 chronic kidney disease, or unspecified chronic kidney disease: Secondary | ICD-10-CM | POA: Diagnosis present

## 2019-03-01 DIAGNOSIS — E1140 Type 2 diabetes mellitus with diabetic neuropathy, unspecified: Secondary | ICD-10-CM | POA: Diagnosis present

## 2019-03-01 DIAGNOSIS — R52 Pain, unspecified: Secondary | ICD-10-CM | POA: Diagnosis not present

## 2019-03-01 DIAGNOSIS — W19XXXA Unspecified fall, initial encounter: Secondary | ICD-10-CM | POA: Diagnosis not present

## 2019-03-01 DIAGNOSIS — K311 Adult hypertrophic pyloric stenosis: Secondary | ICD-10-CM | POA: Diagnosis present

## 2019-03-01 DIAGNOSIS — R1310 Dysphagia, unspecified: Secondary | ICD-10-CM | POA: Diagnosis present

## 2019-03-01 DIAGNOSIS — E222 Syndrome of inappropriate secretion of antidiuretic hormone: Secondary | ICD-10-CM | POA: Diagnosis present

## 2019-03-01 DIAGNOSIS — I959 Hypotension, unspecified: Secondary | ICD-10-CM | POA: Diagnosis not present

## 2019-03-01 DIAGNOSIS — M159 Polyosteoarthritis, unspecified: Secondary | ICD-10-CM | POA: Diagnosis present

## 2019-03-01 DIAGNOSIS — Z20828 Contact with and (suspected) exposure to other viral communicable diseases: Secondary | ICD-10-CM | POA: Diagnosis present

## 2019-03-01 DIAGNOSIS — F32A Depression, unspecified: Secondary | ICD-10-CM | POA: Diagnosis present

## 2019-03-01 DIAGNOSIS — I69391 Dysphagia following cerebral infarction: Secondary | ICD-10-CM

## 2019-03-01 DIAGNOSIS — I5084 End stage heart failure: Secondary | ICD-10-CM

## 2019-03-01 DIAGNOSIS — T85528A Displacement of other gastrointestinal prosthetic devices, implants and grafts, initial encounter: Secondary | ICD-10-CM | POA: Diagnosis not present

## 2019-03-01 DIAGNOSIS — K3184 Gastroparesis: Secondary | ICD-10-CM | POA: Diagnosis present

## 2019-03-01 DIAGNOSIS — I679 Cerebrovascular disease, unspecified: Secondary | ICD-10-CM | POA: Diagnosis present

## 2019-03-01 DIAGNOSIS — E785 Hyperlipidemia, unspecified: Secondary | ICD-10-CM | POA: Diagnosis present

## 2019-03-01 DIAGNOSIS — Z79890 Hormone replacement therapy (postmenopausal): Secondary | ICD-10-CM

## 2019-03-01 DIAGNOSIS — Z7901 Long term (current) use of anticoagulants: Secondary | ICD-10-CM

## 2019-03-01 DIAGNOSIS — L8990 Pressure ulcer of unspecified site, unspecified stage: Secondary | ICD-10-CM | POA: Diagnosis present

## 2019-03-01 DIAGNOSIS — I10 Essential (primary) hypertension: Secondary | ICD-10-CM | POA: Diagnosis present

## 2019-03-01 DIAGNOSIS — I4819 Other persistent atrial fibrillation: Secondary | ICD-10-CM | POA: Diagnosis not present

## 2019-03-01 DIAGNOSIS — Z823 Family history of stroke: Secondary | ICD-10-CM

## 2019-03-01 DIAGNOSIS — Z880 Allergy status to penicillin: Secondary | ICD-10-CM

## 2019-03-01 DIAGNOSIS — M329 Systemic lupus erythematosus, unspecified: Secondary | ICD-10-CM | POA: Diagnosis present

## 2019-03-01 DIAGNOSIS — E1169 Type 2 diabetes mellitus with other specified complication: Secondary | ICD-10-CM | POA: Diagnosis present

## 2019-03-01 DIAGNOSIS — K449 Diaphragmatic hernia without obstruction or gangrene: Secondary | ICD-10-CM | POA: Diagnosis present

## 2019-03-01 DIAGNOSIS — I509 Heart failure, unspecified: Secondary | ICD-10-CM

## 2019-03-01 DIAGNOSIS — Z9981 Dependence on supplemental oxygen: Secondary | ICD-10-CM

## 2019-03-01 DIAGNOSIS — Z8249 Family history of ischemic heart disease and other diseases of the circulatory system: Secondary | ICD-10-CM

## 2019-03-01 DIAGNOSIS — Z833 Family history of diabetes mellitus: Secondary | ICD-10-CM

## 2019-03-01 DIAGNOSIS — E440 Moderate protein-calorie malnutrition: Secondary | ICD-10-CM | POA: Diagnosis present

## 2019-03-01 DIAGNOSIS — D631 Anemia in chronic kidney disease: Secondary | ICD-10-CM | POA: Diagnosis present

## 2019-03-01 DIAGNOSIS — E1151 Type 2 diabetes mellitus with diabetic peripheral angiopathy without gangrene: Secondary | ICD-10-CM | POA: Diagnosis present

## 2019-03-01 DIAGNOSIS — J449 Chronic obstructive pulmonary disease, unspecified: Secondary | ICD-10-CM | POA: Diagnosis present

## 2019-03-01 DIAGNOSIS — K9423 Gastrostomy malfunction: Secondary | ICD-10-CM | POA: Diagnosis not present

## 2019-03-01 DIAGNOSIS — Z03818 Encounter for observation for suspected exposure to other biological agents ruled out: Secondary | ICD-10-CM | POA: Diagnosis not present

## 2019-03-01 DIAGNOSIS — K219 Gastro-esophageal reflux disease without esophagitis: Secondary | ICD-10-CM | POA: Diagnosis present

## 2019-03-01 DIAGNOSIS — Z794 Long term (current) use of insulin: Secondary | ICD-10-CM

## 2019-03-01 DIAGNOSIS — Z86718 Personal history of other venous thrombosis and embolism: Secondary | ICD-10-CM

## 2019-03-01 LAB — CBC WITH DIFFERENTIAL/PLATELET
Abs Immature Granulocytes: 0.03 10*3/uL (ref 0.00–0.07)
Basophils Absolute: 0 10*3/uL (ref 0.0–0.1)
Basophils Relative: 1 %
Eosinophils Absolute: 0.6 10*3/uL — ABNORMAL HIGH (ref 0.0–0.5)
Eosinophils Relative: 10 %
HCT: 29.1 % — ABNORMAL LOW (ref 36.0–46.0)
Hemoglobin: 9.4 g/dL — ABNORMAL LOW (ref 12.0–15.0)
Immature Granulocytes: 1 %
Lymphocytes Relative: 15 %
Lymphs Abs: 0.9 10*3/uL (ref 0.7–4.0)
MCH: 30.3 pg (ref 26.0–34.0)
MCHC: 32.3 g/dL (ref 30.0–36.0)
MCV: 93.9 fL (ref 80.0–100.0)
Monocytes Absolute: 0.8 10*3/uL (ref 0.1–1.0)
Monocytes Relative: 13 %
Neutro Abs: 3.6 10*3/uL (ref 1.7–7.7)
Neutrophils Relative %: 60 %
Platelets: 228 10*3/uL (ref 150–400)
RBC: 3.1 MIL/uL — ABNORMAL LOW (ref 3.87–5.11)
RDW: 15.8 % — ABNORMAL HIGH (ref 11.5–15.5)
WBC: 6 10*3/uL (ref 4.0–10.5)
nRBC: 0 % (ref 0.0–0.2)

## 2019-03-01 LAB — BASIC METABOLIC PANEL
Anion gap: 9 (ref 5–15)
BUN: 60 mg/dL — ABNORMAL HIGH (ref 8–23)
CO2: 25 mmol/L (ref 22–32)
Calcium: 9.5 mg/dL (ref 8.9–10.3)
Chloride: 102 mmol/L (ref 98–111)
Creatinine, Ser: 1.46 mg/dL — ABNORMAL HIGH (ref 0.44–1.00)
GFR calc Af Amer: 36 mL/min — ABNORMAL LOW (ref >60)
GFR calc non Af Amer: 31 mL/min — ABNORMAL LOW (ref >60)
Glucose, Bld: 91 mg/dL (ref 70–99)
Potassium: 4.4 mmol/L (ref 3.5–5.1)
Sodium: 136 mmol/L (ref 135–145)

## 2019-03-01 LAB — PROTIME-INR
INR: 2.1 — ABNORMAL HIGH (ref 0.8–1.2)
Prothrombin Time: 23.8 seconds — ABNORMAL HIGH (ref 11.4–15.2)

## 2019-03-01 NOTE — ED Notes (Signed)
Please call daughter when tube is replaced and pt is ready to discharge, she will pick up pt. Shannon [name] [phone] / [phone].

## 2019-03-01 NOTE — ED Triage Notes (Signed)
Pt arrived from home via [facility] due to accidental removal of J tube. Pt is A&Ox4. Daughter is caretaker and will be in route to hospital shortly.   BP 158/125 HR 72 RR 18 SpO2 99%  Temp 97.7  CBG 120

## 2019-03-01 NOTE — ED Provider Notes (Signed)
[facility] DEPT Provider Note   CSN: [id number] Arrival date & time: 03/01/19  2043     History   Chief Complaint Chief Complaint  Patient presents with  . Pulled J tube    HPI Shannon [name] is a 85 y.o. female.     Patient is a 85 year old female with a history of multiple medical problems including stage III chronic kidney disease, CHF, coronary artery disease, atrial fibrillation on Coumadin, COPD, DVT, gastric stenosis requiring GJ tube for her nutrition who is presenting today because her J-tube became dislodged.  Patient states for the last 2 days it has been irritating her and intermittently leaking but tonight it started leaking a lot and then it subsequently fell out.  Daughter states she gets all of her nutrition through this tube and she gets it at night usually and has not had it tonight but did have it yesterday.  Patient has taken all of her medications today.  She has no complaints of abdominal pain, nausea or vomiting.    The history is provided by the patient and a caregiver.    Past Medical History:  Diagnosis Date  . Acute respiratory failure ([place])   . Anemia    previous blood transfusions  . Arthritis    "all over"  . Asthma   . Atrial flutter ([place])   . Bradycardia    requiring previous d/c of BB and reduction of amiodarone  . CAD (coronary artery disease)    nonobstructive per notes  . Chronic diastolic CHF (congestive heart failure) ([place])   . CKD (chronic kidney disease), stage III   . Complication of blood transfusion    "got the wrong blood type at [facility] in ~ 2015; no adverse reaction that we are aware of"/daughter, Shannon [name] (01/27/2016)  . COPD (chronic obstructive pulmonary disease) ([place])   . Depression    "light case"  . DVT (deep venous thrombosis) ([place]) 01/2016   a. LLE DVT 01/2016 - switched from Eliquis to Coumadin.  [name] Dyspnea    with some activity  . Gastric stenosis    a. s/p stomach tube  . GERD  (gastroesophageal reflux disease)   . History of blood transfusion    "several" (01/27/2016)  . History of stomach ulcers   . Hyperlipidemia   . Hypertension   . Hypothyroidism   . On home oxygen therapy    "2L; 9PM til 9AM" (06/27/2017)  . PAD (peripheral artery disease) ([place])    a. prior gangrene, toe amputations, intervention.  [name] PAF (paroxysmal atrial fibrillation) ([place])   . Paraesophageal hernia   . Perforated gastric ulcer ([place])   . Pneumonia    "a few times" (06/27/2017)  . Seasonal allergies   . SIADH (syndrome of inappropriate ADH production) ([place])    [name] 01/10/2015  . Small bowel obstruction ([place])    "I don't know how many" (01/11/2015)  . Stroke [facility])    several- no residual  . Type II diabetes mellitus ([place])    "related to prednisone use  for > 20 yr; once predinose stopped; no more DM RX" (01/27/2016)  . UTI (urinary tract infection) 02/08/2016  . Ventral hernia with bowel obstruction     Patient Active Problem List   Diagnosis Date Noted  . S/P amputation of lesser toe, left ([place]) 02/13/2019  . BRBPR (bright red blood per rectum) 01/10/2019  . GI bleed 12/25/2018  . Bradycardia 11/25/2018  . Amputated great toe of right foot ([place])  05/06/2018  . Peripheral vascular disease ([place]) 05/03/2018  . Localized, primary osteoarthritis of shoulder region 01/21/2018  . PAD (peripheral artery disease) ([place]) 06/27/2017  . Acute renal failure superimposed on stage 3 chronic kidney disease ([place]) 02/20/2017  . Complication of feeding tube ([place]) 02/20/2017  . PEG tube malfunction ([place])   . COPD with asthma ([place]) 10/03/2016  . Chronic deep vein thrombosis (DVT) of tibial vein of left lower extremity ([place]) 10/03/2016  . Gangrene of toe of left foot ([place]) 09/15/2016  . Recurrent aspiration pneumonia ([place])   . Atherosclerosis of native arteries of the extremities with gangrene ([place]) 07/28/2016  . Other specified hypothyroidism   . CHF (NYHA class IV, ACC/AHA stage D) ([place])   .  Shortness of breath   . Pressure injury of skin 03/11/2016  . PEG (percutaneous endoscopic gastrostomy) status ([place]) 03/10/2016  . Encounter for therapeutic drug monitoring 02/16/2016  . Warfarin anticoagulation   . Chronic seasonal allergic rhinitis   . Atrial fibrillation and flutter ([place]) 01/24/2016  . Chronic diastolic CHF (congestive heart failure) ([place]) 01/24/2016  . Bilateral leg edema 12/29/2015  . Hypothyroidism due to medication   . Weakness   . Pyloric stenosis   . Pressure ulcer 09/15/2015  . Acute osteomyelitis of toe of right foot ([place])   . PVD (peripheral vascular disease) ([place])   . Diabetic ulcer of toe of right foot associated with type 2 diabetes mellitus, with necrosis of muscle ([address] [address])   . Diverticulitis of large intestine without perforation or abscess without bleeding   . Colitis 08/26/2015  . Esophageal candidiasis ([place])   . SOB (shortness of breath)   . Acute renal failure superimposed on stage 4 chronic kidney disease ([place])   . Encounter for long-term (current) use of other high-risk medications   . Dysphagia 07/05/2015  . Asthma with allergic rhinitis 06/04/2015  . Hypertensive heart disease 03/18/2015  . Malnutrition of moderate degree 01/28/2015  . Syncope 01/27/2015  . Symptomatic bradycardia 01/21/2015  . Hypotension due to drugs 01/21/2015  . Protein-calorie malnutrition, severe 01/12/2015  . Cerebrovascular disease 07/14/2014  . Diabetic polyneuropathy associated with type 2 diabetes mellitus ([place]) 07/14/2014  . Hyperlipidemia 07/14/2014  . Gastric outlet obstruction 05/21/2014  . Unstable gait 04/29/2014  . Slurred speech   . Gastroparesis   . Constipation 02/03/2014  . Carotid stenosis 02/03/2014  . Demand ischemia ([place]) 01/21/2014  . Gastric bezoar 01/20/2014  . Anemia of chronic disease 01/18/2014  . Chronic kidney insufficiency, stage 3 (moderate) 01/18/2014  . Depression 09/29/2013  . Essential hypertension, benign 09/22/2013  . GERD  (gastroesophageal reflux disease) 09/22/2013  . Dyslipidemia 09/22/2013  . Type 2 diabetes mellitus with chronic kidney disease ([place]) 09/22/2013  . Persistent atrial fibrillation 09/22/2013  . History of completed stroke 09/22/2013  . Diabetic neuropathy ([place]) 09/22/2013  . Hypothyroidism 09/22/2013  . Chronic gastrojejunal ulcer with perforation ([place]) 09/07/2013  . Accelerated hypertension 09/07/2013  . Systemic lupus erythematosus (SLE) inhibitor ([place]) 09/07/2013  . Other specified abnormal immunological findings in serum 09/07/2013  . Red blood cell antibody positive 09/07/2013    Past Surgical History:  Procedure Laterality Date  . ABDOMINAL AORTOGRAM N/A 09/21/2016   Procedure: Abdominal Aortogram;  Surgeon: Waynetta Sandy, MD;  Location: [facility];  Service: Cardiovascular;  Laterality: N/A;  . AMPUTATION Left 09/25/2016   Procedure: AMPUTATION DIGIT- LEFT 2ND AND 3RD TOES;  Surgeon: Rosetta Posner, MD;  Location: [facility];  Service: Vascular;  Laterality: Left;  .  AMPUTATION Right 05/03/2018   Procedure: AMPUTATION RIGHT GREAT TOE;  Surgeon: Rosetta Posner, MD;  Location: [facility];  Service: Vascular;  Laterality: Right;  . CATARACT EXTRACTION W/ INTRAOCULAR LENS  IMPLANT, BILATERAL    . CHOLECYSTECTOMY OPEN    . COLECTOMY    . ESOPHAGOGASTRODUODENOSCOPY N/A 01/19/2014   Procedure: ESOPHAGOGASTRODUODENOSCOPY (EGD);  Surgeon: Irene Shipper, MD;  [facility];  Service: Endoscopy;  Laterality: N/A;  . ESOPHAGOGASTRODUODENOSCOPY N/A 01/20/2014   Procedure: ESOPHAGOGASTRODUODENOSCOPY (EGD);  Surgeon: Irene Shipper, MD;  [facility];  Service: Endoscopy;  Laterality: N/A;  . ESOPHAGOGASTRODUODENOSCOPY N/A 03/19/2014   Procedure: ESOPHAGOGASTRODUODENOSCOPY (EGD);  Surgeon: Milus Banister, MD;  Location[facility];  Service: Endoscopy;  Laterality: N/A;  . ESOPHAGOGASTRODUODENOSCOPY N/A 07/08/2015   Procedure: ESOPHAGOGASTRODUODENOSCOPY (EGD);  Surgeon:  Doran Stabler, MD;  Location: [facility] ENDOSCOPY;  Service: Endoscopy;  Laterality: N/A;  . ESOPHAGOGASTRODUODENOSCOPY (EGD) WITH PROPOFOL N/A 09/15/2015   Procedure: ESOPHAGOGASTRODUODENOSCOPY (EGD) WITH PROPOFOL;  Surgeon: Manus Gunning, MD;  Location: [facility];  Service: Gastroenterology;  Laterality: N/A;  . GASTROJEJUNOSTOMY     hx/notes 01/10/2015  . GASTROJEJUNOSTOMY N/A 09/23/2015   Procedure: OPEN GASTROJEJUNOSTOMY TUBE PLACEMENT;  Surgeon: Arta Bruce Kinsinger, MD;  Location: [facility];  Service: General;  Laterality: N/A;  . GLAUCOMA SURGERY Bilateral   . HERNIA REPAIR  2015  . IR CM INJ ANY COLONIC TUBE W/FLUORO  01/05/2017  . IR CM INJ ANY COLONIC TUBE W/FLUORO  06/07/2017  . IR CM INJ ANY COLONIC TUBE W/FLUORO  11/05/2017  . IR CM INJ ANY COLONIC TUBE W/FLUORO  09/18/2018  . IR GENERIC HISTORICAL  01/07/2016   IR GJ TUBE CHANGE 01/07/2016 Jacqulynn Cadet, MD [facility]  . IR GENERIC HISTORICAL  01/27/2016   IR MECH REMOV OBSTRUC MAT ANY COLON TUBE W/FLUORO 01/27/2016 Markus Daft, MD [facility]  . IR GENERIC HISTORICAL  02/07/2016   IR PATIENT EVAL TECH 0-60 MINS Darrell K Allred, PA-C [facility]  . IR GENERIC HISTORICAL  02/08/2016   IR GJ TUBE CHANGE 02/08/2016 Greggory Keen, MD [facility]  . IR GENERIC HISTORICAL  01/06/2016   IR GJ TUBE CHANGE 01/06/2016 [facility] OUT REF  . IR GENERIC HISTORICAL  05/02/2016   IR CM INJ ANY COLONIC TUBE W/FLUORO 05/02/2016 Arne Cleveland, MD [facility]  . IR GENERIC HISTORICAL  05/15/2016   IR GJ TUBE CHANGE 05/15/2016 Sandi Mariscal, MD [facility]  . IR GENERIC HISTORICAL  06/28/2016   IR GJ TUBE CHANGE 06/28/2016 [facility]  . IR GJ TUBE CHANGE  02/20/2017  . IR GJ TUBE CHANGE  05/10/2017  . IR GJ TUBE CHANGE  07/06/2017  . IR GJ TUBE CHANGE  08/02/2017  . IR GJ TUBE CHANGE  10/15/2017  . IR GJ TUBE CHANGE  12/26/2017  . IR [place] TUBE CHANGE  04/08/2018  . IR [place] TUBE CHANGE  06/19/2018  . IR PATIENT EVAL TECH 0-60 MINS  10/19/2016  . IR  PATIENT EVAL TECH 0-60 MINS  12/25/2016  . IR PATIENT EVAL TECH 0-60 MINS  01/29/2017  . IR PATIENT EVAL TECH 0-60 MINS  04/04/2017  . IR PATIENT EVAL TECH 0-60 MINS  04/30/2017  . IR PATIENT EVAL TECH 0-60 MINS  07/31/2017  . IR REPLC DUODEN/JEJUNO TUBE PERCUT W/FLUORO  11/14/2016  . LAPAROTOMY N/A 01/20/2015   Procedure: EXPLORATORY LAPAROTOMY;  Surgeon: Coralie Keens, MD;  Location: [facility];  Service: General;  Laterality: N/A;  . LOWER EXTREMITY  ANGIOGRAPHY Left 09/21/2016   Procedure: Lower Extremity Angiography;  Surgeon: Waynetta Sandy, MD;  Location: [facility];  Service: Cardiovascular;  Laterality: Left;  . LOWER EXTREMITY ANGIOGRAPHY Right 06/27/2017   Procedure: Lower Extremity Angiography;  Surgeon: Waynetta Sandy, MD;  Location: [facility];  Service: Cardiovascular;  Laterality: Right;  . LYSIS OF ADHESION N/A 01/20/2015   Procedure: LYSIS OF ADHESIONS < 1 HOUR;  Surgeon: Coralie Keens, MD;  Location: [facility];  Service: General;  Laterality: N/A;  . PERIPHERAL VASCULAR BALLOON ANGIOPLASTY Left 09/21/2016   Procedure: Peripheral Vascular Balloon Angioplasty;  Surgeon: Waynetta Sandy, MD;  Location: [facility];  Service: Cardiovascular;  Laterality: Left;  drug coated balloon  . PERIPHERAL VASCULAR BALLOON ANGIOPLASTY Right 06/27/2017   Procedure: PERIPHERAL VASCULAR BALLOON ANGIOPLASTY;  Surgeon: Waynetta Sandy, MD;  Location: [facility];  Service: Cardiovascular;  Laterality: Right;  SFA/TPTRUNK  . TONSILLECTOMY    . TUBAL LIGATION    . VENTRAL HERNIA REPAIR  2015   incarcerated ventral hernia ([facility] 09/2013)/notes 01/10/2015     OB History   No obstetric history on file.      Home Medications    Prior to Admission medications   Medication Sig Start Date End Date Taking? Authorizing Provider  acetaminophen (TYLENOL) 500 MG tablet Take 500 mg by mouth every 6 (six) hours as needed (pain).     [provider]  Amino Acids-Protein Hydrolys (FEEDING SUPPLEMENT, PRO-STAT SUGAR FREE 64,) LIQD Take 30 mLs by mouth 2 (two) times daily.    [provider]  amLODipine (NORVASC) 5 MG tablet Take 1 tablet (5 mg total) by mouth daily. 11/26/18 11/26/19  Marcell Anger, MD  atorvastatin (LIPITOR) 10 MG tablet Take 1 tablet (10 mg total) by mouth daily. 09/25/18   [name], NP  b complex vitamins tablet Take 1 tablet by mouth daily.     [provider]  calcium-vitamin D (OSCAL WITH D) 500-200 MG-UNIT per tablet Take 1 tablet by mouth daily with breakfast.     [provider]  Carboxymethylcellulose Sodium (ARTIFICIAL TEARS OP) Place 1 drop into both eyes 4 (four) times daily.    [provider]  collagenase (SANTYL) ointment Apply 1 application topically daily as needed (wound care).     [provider]  cycloSPORINE (RESTASIS) 0.05 % ophthalmic emulsion USE 1 DROP INTO BOTH EYES TWICE DAILY 01/29/17   Gildardo Cranker, DO  DULERA 200-5 MCG/ACT AERO TAKE TWO PUFFS BY MOUTH TWICE DAILY 01/13/19   [name], NP  feeding supplement (OSMOLITE 1 CAL) LIQD Take 711 mLs by mouth See admin instructions. For 12 hours 2100 - 0900    [provider]  ferrous sulfate 325 (65 FE) MG tablet TAKE 1 TABLET BY MOUTH EVERY DAY 11/01/18   [name], NP  fluticasone (FLONASE) 50 MCG/ACT nasal spray SPRAY 2 SPRAYS INTO EACH NOSTRIL EVERY DAY 02/06/19   [name], NP  furosemide (LASIX) 40 MG tablet Alternate taking 40 mg and 20 mg every other day 02/19/19   Imogene Burn, PA-C  gabapentin (NEURONTIN) 300 MG capsule TAKE 1 CAPSULE BY MOUTH TWICE A DAY 02/06/19   [name], NP  gentamicin cream (GARAMYCIN) 0.1 % Apply 1 application topically 3 (three) times daily as needed (dry skin).     [provider]  hydrALAZINE (APRESOLINE) 50 MG tablet TAKE 1 TABLET BY MOUTH THREE TIMES A  DAY 02/06/19   Dorothy Spark,  MD  insulin aspart (NOVOLOG) 100 UNIT/ML FlexPen Inject 0-6 Units into the skin 3 (three) times daily with meals. New sliding Scale If BS is 151 - 251 = 2 units,  If BS is 251 -  350 = 4 units,  If BS is 351 - 450 = 6 units Call provider if BS runs more than 400 02/03/19   [name], NP  Insulin Glargine (LANTUS SOLOSTAR) 100 UNIT/ML Solostar Pen Inject 8 Units into the skin at bedtime. 02/20/19   [name], NP  Insulin Pen Needle (B-D UF III MINI PEN NEEDLES) 31G X 5 MM MISC Use twice daily with giving insulin injections. Dx E11.9 07/17/18   [name], NP  ipratropium-albuterol (DUONEB) 0.5-2.5 (3) MG/3ML SOLN Take 3 mLs by nebulization every 6 (six) hours as needed (sob). Use 3 times daily x 4 days then every 6 hours as needed. 03/21/16   Eileen Stanford, PA-C  isosorbide mononitrate (IMDUR) 30 MG 24 hr tablet TAKE 2 TABLETS BY MOUTH DAILY. PLEASE MAKE ANNUAL APPT WITH DR. Meda Coffee FOR FUTURE REFILLS. THANK YOU. 1ST ATTEMPT. 12/27/18   Dorothy Spark, MD  latanoprost (XALATAN) 0.005 % ophthalmic solution Place 1 drop into both eyes at bedtime. 06/02/14   Blanchie Serve, MD  levalbuterol [facility] HFA) 45 MCG/ACT inhaler Inhale 2 puffs into the lungs every 6 (six) hours as needed for wheezing or shortness of breath. 02/03/16   Gildardo Cranker, DO  levothyroxine (SYNTHROID, LEVOTHROID) 125 MCG tablet Take 1 tablet (125 mcg total) by mouth daily. 04/18/18   [name], NP  loratadine (CLARITIN) 10 MG tablet Take 10 mg by mouth daily as needed for allergies.    [provider]  Multiple Vitamin (MULTIVITAMIN WITH MINERALS) TABS tablet Take 1 tablet by mouth daily.    [provider]  nystatin (MYCOSTATIN/NYSTOP) powder Apply 1 g topically as needed (skin irritation).     [provider]  oxyCODONE (ROXICODONE) 5 MG immediate release tablet Take 1 tablet (5 mg total) by mouth every 4 (four) hours as needed for severe pain. May take 1/2 tab q4hrs as  needed for mild pain. 06/14/18   [name], NP  OXYGEN Inhale 2 L into the lungs at bedtime.    [provider]  pantoprazole (PROTONIX) 40 MG tablet TAKE 1 TABLET BY MOUTH TWICE A DAY 12/24/18   [name], NP  sertraline (ZOLOFT) 50 MG tablet Take 1 tablet (50 mg total) by mouth daily. 01/09/19   Medina-Vargas, Monina C, NP  warfarin (COUMADIN) 2.5 MG tablet TAKE AS DIRECTED BY COUMADIN CLINIC. Patient taking differently: Take 5-7.5 mg by mouth See admin instructions. Take 5mg  by mouth daily, except on Sunday, take 7.5mg  by mouth 09/02/18   Dorothy Spark, MD  Water For Irrigation, Sterile (FREE WATER) SOLN Place 150 mLs into feeding tube 3 (three) times daily.     [provider]  White Petrolatum-Mineral Oil (SYSTANE NIGHTTIME) OINT Place 1 application into both eyes at bedtime.     [provider]    Family History Family History  Problem Relation Age of Onset  . Stroke Mother   . Hypertension Mother   . Diabetes Brother   . Glaucoma Son   . Glaucoma Son   . Heart attack Neg Hx   . Colon cancer Neg Hx   . Stomach cancer Neg Hx     Social History Social History  Tobacco Use  . Smoking status: Never Smoker  . Smokeless tobacco: Former Systems developer    Types: Snuff  . Tobacco comment: "used snuff in my younger days"  Substance Use Topics  . Alcohol use: Never    Alcohol/week: 0.0 standard drinks    Frequency: Never  . Drug use: Never     Allergies   Penicillins   Review of Systems Review of Systems  All other systems reviewed and are negative.    Physical Exam Updated Vital Signs BP (!) 142/58   Pulse 62   Temp 99 F (37.2 C) (Oral)   Resp 19   Ht 5\' 6"  (1.676 m)   Wt 63.5 kg   SpO2 98%   BMI 22.60 kg/m   Physical Exam Vitals signs and nursing note reviewed.  Constitutional:      General: She is not in acute distress.    Appearance: She is well-developed and normal weight.  HENT:     Head: Normocephalic and  atraumatic.  Eyes:     Conjunctiva/sclera: Conjunctivae normal.     Pupils: Pupils are equal, round, and reactive to light.  Neck:     Musculoskeletal: Normal range of motion and neck supple.  Cardiovascular:     Rate and Rhythm: Normal rate. Rhythm irregular.     Pulses: Normal pulses.     Heart sounds: No murmur.  Pulmonary:     Effort: Pulmonary effort is normal. No respiratory distress.     Breath sounds: Normal breath sounds. No wheezing or rales.  Abdominal:     General: There is no distension.     Palpations: Abdomen is soft.     Tenderness: There is no abdominal tenderness. There is no guarding or rebound.     Comments: Multiple surgical incisions with multiple hernias and G-tube site present and leaking gastric fluid  Musculoskeletal: Normal range of motion.        General: No tenderness.     Right lower leg: No edema.     Left lower leg: No edema.  Skin:    General: Skin is warm and dry.     Findings: No erythema or rash.  Neurological:     Mental Status: She is alert and oriented to person, place, and time. Mental status is at baseline.  Psychiatric:        Mood and Affect: Mood normal.        Behavior: Behavior normal.        Thought Content: Thought content normal.      ED Treatments / Results  Labs (all labs ordered are listed, but only abnormal results are displayed) Labs Reviewed  CBC WITH DIFFERENTIAL/PLATELET - Abnormal; Notable for the following components:      Result Value   RBC 3.10 (*)    Hemoglobin 9.4 (*)    HCT 29.1 (*)    RDW 15.8 (*)    Eosinophils Absolute 0.6 (*)    All other components within normal limits  BASIC METABOLIC PANEL - Abnormal; Notable for the following components:   BUN 60 (*)    Creatinine, Ser 1.46 (*)    GFR calc non Af Amer 31 (*)    GFR calc Af Amer 36 (*)    All other components within normal limits  PROTIME-INR - Abnormal; Notable for the following components:   Prothrombin Time 23.8 (*)    INR 2.1 (*)    All  other components within normal limits  SARS CORONAVIRUS 2 (TAT 6-24 HRS)  EKG None  Radiology No results found.  Procedures Procedures (including critical care time)  Medications Ordered in ED Medications - No data to display   Initial Impression / Assessment and Plan / ED Course  I have reviewed the triage vital signs and the nursing notes.  Pertinent labs & imaging results that were available during my care of the patient were reviewed by me and considered in my medical decision making (see chart for details).       Pt presenting today because her GJ tube came out.  Pt recieves all her nutrition through this tube and did not receive it today.  Pt has had some irritation around the port for the last few days but no abd pain.  Unable to place GJ tube tonight.  Will require admission for tube placement with IR tomorrow.  Pt o/w well appearing and in no acute distress.  45F foley placed temporarily to keep tract open.  Pt may need IVF to remain hydrated  However because gastric stenosis cannot feed her with a g-tube. Patient's INR is therapeutic at 2.1.  CBC shows stable hemoglobin of 9.4 and stable renal function.   Final Clinical Impressions(s) / ED Diagnoses   Final diagnoses:  Jejunostomy tube fell out    ED Discharge Orders    None       Blanchie Dessert, MD 03/02/19 0004

## 2019-03-02 DIAGNOSIS — E1143 Type 2 diabetes mellitus with diabetic autonomic (poly)neuropathy: Secondary | ICD-10-CM | POA: Diagnosis present

## 2019-03-02 DIAGNOSIS — Z20828 Contact with and (suspected) exposure to other viral communicable diseases: Secondary | ICD-10-CM | POA: Diagnosis present

## 2019-03-02 DIAGNOSIS — T85528A Displacement of other gastrointestinal prosthetic devices, implants and grafts, initial encounter: Secondary | ICD-10-CM | POA: Diagnosis not present

## 2019-03-02 DIAGNOSIS — I679 Cerebrovascular disease, unspecified: Secondary | ICD-10-CM | POA: Diagnosis present

## 2019-03-02 DIAGNOSIS — Z9981 Dependence on supplemental oxygen: Secondary | ICD-10-CM | POA: Diagnosis not present

## 2019-03-02 DIAGNOSIS — Z823 Family history of stroke: Secondary | ICD-10-CM | POA: Diagnosis not present

## 2019-03-02 DIAGNOSIS — K219 Gastro-esophageal reflux disease without esophagitis: Secondary | ICD-10-CM | POA: Diagnosis present

## 2019-03-02 DIAGNOSIS — M159 Polyosteoarthritis, unspecified: Secondary | ICD-10-CM | POA: Diagnosis present

## 2019-03-02 DIAGNOSIS — N1832 Chronic kidney disease, stage 3b: Secondary | ICD-10-CM | POA: Diagnosis present

## 2019-03-02 DIAGNOSIS — I4819 Other persistent atrial fibrillation: Secondary | ICD-10-CM | POA: Diagnosis present

## 2019-03-02 DIAGNOSIS — Z86718 Personal history of other venous thrombosis and embolism: Secondary | ICD-10-CM | POA: Diagnosis not present

## 2019-03-02 DIAGNOSIS — K9413 Enterostomy malfunction: Secondary | ICD-10-CM | POA: Diagnosis not present

## 2019-03-02 DIAGNOSIS — K9423 Gastrostomy malfunction: Secondary | ICD-10-CM | POA: Diagnosis not present

## 2019-03-02 DIAGNOSIS — K311 Adult hypertrophic pyloric stenosis: Secondary | ICD-10-CM | POA: Diagnosis present

## 2019-03-02 DIAGNOSIS — J449 Chronic obstructive pulmonary disease, unspecified: Secondary | ICD-10-CM | POA: Diagnosis present

## 2019-03-02 DIAGNOSIS — I5032 Chronic diastolic (congestive) heart failure: Secondary | ICD-10-CM | POA: Diagnosis present

## 2019-03-02 DIAGNOSIS — E440 Moderate protein-calorie malnutrition: Secondary | ICD-10-CM | POA: Diagnosis present

## 2019-03-02 DIAGNOSIS — Z434 Encounter for attention to other artificial openings of digestive tract: Secondary | ICD-10-CM | POA: Diagnosis not present

## 2019-03-02 DIAGNOSIS — E1151 Type 2 diabetes mellitus with diabetic peripheral angiopathy without gangrene: Secondary | ICD-10-CM | POA: Diagnosis present

## 2019-03-02 DIAGNOSIS — I2510 Atherosclerotic heart disease of native coronary artery without angina pectoris: Secondary | ICD-10-CM | POA: Diagnosis present

## 2019-03-02 DIAGNOSIS — K3184 Gastroparesis: Secondary | ICD-10-CM | POA: Diagnosis present

## 2019-03-02 DIAGNOSIS — K449 Diaphragmatic hernia without obstruction or gangrene: Secondary | ICD-10-CM | POA: Diagnosis present

## 2019-03-02 DIAGNOSIS — Z7901 Long term (current) use of anticoagulants: Secondary | ICD-10-CM | POA: Diagnosis not present

## 2019-03-02 DIAGNOSIS — E039 Hypothyroidism, unspecified: Secondary | ICD-10-CM | POA: Diagnosis present

## 2019-03-02 DIAGNOSIS — E222 Syndrome of inappropriate secretion of antidiuretic hormone: Secondary | ICD-10-CM | POA: Diagnosis present

## 2019-03-02 DIAGNOSIS — E785 Hyperlipidemia, unspecified: Secondary | ICD-10-CM | POA: Diagnosis present

## 2019-03-02 DIAGNOSIS — I130 Hypertensive heart and chronic kidney disease with heart failure and stage 1 through stage 4 chronic kidney disease, or unspecified chronic kidney disease: Secondary | ICD-10-CM | POA: Diagnosis present

## 2019-03-02 LAB — CBC
HCT: 28.6 % — ABNORMAL LOW (ref 36.0–46.0)
HCT: 30 % — ABNORMAL LOW (ref 36.0–46.0)
Hemoglobin: 9.2 g/dL — ABNORMAL LOW (ref 12.0–15.0)
Hemoglobin: 9.8 g/dL — ABNORMAL LOW (ref 12.0–15.0)
MCH: 30.2 pg (ref 26.0–34.0)
MCH: 31 pg (ref 26.0–34.0)
MCHC: 32.2 g/dL (ref 30.0–36.0)
MCHC: 32.7 g/dL (ref 30.0–36.0)
MCV: 93.8 fL (ref 80.0–100.0)
MCV: 94.9 fL (ref 80.0–100.0)
Platelets: 214 10*3/uL (ref 150–400)
Platelets: 224 10*3/uL (ref 150–400)
RBC: 3.05 MIL/uL — ABNORMAL LOW (ref 3.87–5.11)
RBC: 3.16 MIL/uL — ABNORMAL LOW (ref 3.87–5.11)
RDW: 15.8 % — ABNORMAL HIGH (ref 11.5–15.5)
RDW: 15.8 % — ABNORMAL HIGH (ref 11.5–15.5)
WBC: 5.2 10*3/uL (ref 4.0–10.5)
WBC: 6.3 10*3/uL (ref 4.0–10.5)
nRBC: 0 % (ref 0.0–0.2)
nRBC: 0 % (ref 0.0–0.2)

## 2019-03-02 LAB — SARS CORONAVIRUS 2 (TAT 6-24 HRS): SARS Coronavirus 2: NEGATIVE

## 2019-03-02 LAB — GLUCOSE, CAPILLARY
Glucose-Capillary: 90 mg/dL (ref 70–99)
Glucose-Capillary: 126 mg/dL — ABNORMAL HIGH (ref 70–99)
Glucose-Capillary: 74 mg/dL (ref 70–99)
Glucose-Capillary: 118 mg/dL — ABNORMAL HIGH (ref 70–99)

## 2019-03-02 LAB — BASIC METABOLIC PANEL
Anion gap: 7 (ref 5–15)
BUN: 52 mg/dL — ABNORMAL HIGH (ref 8–23)
CO2: 24 mmol/L (ref 22–32)
Calcium: 9.1 mg/dL (ref 8.9–10.3)
Chloride: 105 mmol/L (ref 98–111)
Creatinine, Ser: 1.39 mg/dL — ABNORMAL HIGH (ref 0.44–1.00)
GFR calc Af Amer: 38 mL/min — ABNORMAL LOW (ref >60)
GFR calc non Af Amer: 33 mL/min — ABNORMAL LOW (ref >60)
Glucose, Bld: 93 mg/dL (ref 70–99)
Potassium: 4.1 mmol/L (ref 3.5–5.1)
Sodium: 136 mmol/L (ref 135–145)

## 2019-03-02 LAB — PROTIME-INR
INR: 1.9 — ABNORMAL HIGH (ref 0.8–1.2)
Prothrombin Time: 21.5 seconds — ABNORMAL HIGH (ref 11.4–15.2)

## 2019-03-02 LAB — HEMOGLOBIN A1C
Hgb A1c MFr Bld: 6.2 % — ABNORMAL HIGH (ref 4.8–5.6)
Mean Plasma Glucose: 131.24 mg/dL

## 2019-03-02 LAB — CREATININE, SERUM
Creatinine, Ser: 1.43 mg/dL — ABNORMAL HIGH (ref 0.44–1.00)
GFR calc Af Amer: 36 mL/min — ABNORMAL LOW (ref >60)
GFR calc non Af Amer: 31 mL/min — ABNORMAL LOW (ref >60)

## 2019-03-02 MED ORDER — HYDRALAZINE HCL 10 MG PO TABS
50.0000 mg | ORAL_TABLET | Freq: Three times a day (TID) | ORAL | Status: DC
  Administered 2019-03-02 (×3): 50 mg via ORAL
  Filled 2019-03-02 (×4): qty 5

## 2019-03-02 MED ORDER — ENOXAPARIN SODIUM 30 MG/0.3ML ~~LOC~~ SOLN: 30.0000 mg | SUBCUTANEOUS | Status: DC

## 2019-03-02 MED ORDER — ONDANSETRON HCL 4 MG PO TABS: 4.0000 mg | ORAL_TABLET | Freq: Four times a day (QID) | ORAL | Status: DC | PRN

## 2019-03-02 MED ORDER — FLUTICASONE PROPIONATE 50 MCG/ACT NA SUSP
2.0000 | Freq: Every day | NASAL | Status: DC
  Administered 2019-03-02: 2 via NASAL
  Filled 2019-03-02 (×2): qty 16

## 2019-03-02 MED ORDER — SERTRALINE HCL 50 MG PO TABS
50.0000 mg | ORAL_TABLET | Freq: Every day | ORAL | Status: DC
  Administered 2019-03-02: 50 mg via ORAL
  Filled 2019-03-02: qty 1

## 2019-03-02 MED ORDER — LORATADINE 10 MG PO TABS: 10.0000 mg | ORAL_TABLET | Freq: Every day | ORAL | Status: DC | PRN

## 2019-03-02 MED ORDER — AMLODIPINE BESYLATE 5 MG PO TABS
5.0000 mg | ORAL_TABLET | Freq: Every day | ORAL | Status: DC
  Filled 2019-03-02: qty 1

## 2019-03-02 MED ORDER — INSULIN ASPART 100 UNIT/ML ~~LOC~~ SOLN
0.0000 [IU] | Freq: Three times a day (TID) | SUBCUTANEOUS | Status: DC
  Administered 2019-03-02: 2 [IU] via SUBCUTANEOUS

## 2019-03-02 MED ORDER — ATORVASTATIN CALCIUM 10 MG PO TABS
10.0000 mg | ORAL_TABLET | Freq: Every day | ORAL | Status: DC
  Administered 2019-03-02: 10 mg via ORAL
  Filled 2019-03-02: qty 1

## 2019-03-02 MED ORDER — FUROSEMIDE 40 MG PO TABS
20.0000 mg | ORAL_TABLET | Freq: Every day | ORAL | Status: DC
  Administered 2019-03-02: 20 mg via ORAL
  Filled 2019-03-02: qty 1

## 2019-03-02 MED ORDER — ISOSORBIDE MONONITRATE ER 60 MG PO TB24
60.0000 mg | ORAL_TABLET | Freq: Every day | ORAL | Status: DC
  Administered 2019-03-02: 60 mg via ORAL
  Filled 2019-03-02: qty 1

## 2019-03-02 MED ORDER — POLYETHYLENE GLYCOL 3350 17 G PO PACK: 17.0000 g | PACK | Freq: Every day | ORAL | Status: DC | PRN

## 2019-03-02 MED ORDER — SODIUM CHLORIDE 0.9 % IV SOLN
INTRAVENOUS | Status: DC
  Administered 2019-03-02 – 2019-03-03 (×2): via INTRAVENOUS

## 2019-03-02 MED ORDER — LEVOTHYROXINE SODIUM 25 MCG PO TABS: 125.0000 ug | ORAL_TABLET | Freq: Every day | ORAL | Status: DC

## 2019-03-02 MED ORDER — CLONIDINE HCL 0.1 MG PO TABS: 0.1000 mg | ORAL_TABLET | ORAL | Status: DC | PRN

## 2019-03-02 MED ORDER — INSULIN GLARGINE 100 UNIT/ML ~~LOC~~ SOLN
5.0000 [IU] | Freq: Every day | SUBCUTANEOUS | Status: DC
  Administered 2019-03-02: 5 [IU] via SUBCUTANEOUS
  Filled 2019-03-02 (×2): qty 0.05

## 2019-03-02 MED ORDER — INSULIN ASPART 100 UNIT/ML ~~LOC~~ SOLN: 0.0000 [IU] | Freq: Every day | SUBCUTANEOUS | Status: DC

## 2019-03-02 MED ORDER — MOMETASONE FURO-FORMOTEROL FUM 200-5 MCG/ACT IN AERO
2.0000 | INHALATION_SPRAY | Freq: Two times a day (BID) | RESPIRATORY_TRACT | Status: DC
  Administered 2019-03-02 – 2019-03-03 (×2): 2 via RESPIRATORY_TRACT
  Filled 2019-03-02: qty 8.8

## 2019-03-02 MED ORDER — ONDANSETRON HCL 4 MG/2ML IJ SOLN: 4.0000 mg | Freq: Four times a day (QID) | INTRAMUSCULAR | Status: DC | PRN

## 2019-03-02 MED ORDER — IPRATROPIUM-ALBUTEROL 0.5-2.5 (3) MG/3ML IN SOLN: 3.0000 mL | Freq: Four times a day (QID) | RESPIRATORY_TRACT | Status: DC | PRN

## 2019-03-02 MED ORDER — GABAPENTIN 300 MG PO CAPS
300.0000 mg | ORAL_CAPSULE | Freq: Two times a day (BID) | ORAL | Status: DC
  Administered 2019-03-02 (×2): 300 mg via ORAL
  Filled 2019-03-02 (×3): qty 1

## 2019-03-02 MED ORDER — PANTOPRAZOLE SODIUM 40 MG PO TBEC
40.0000 mg | DELAYED_RELEASE_TABLET | Freq: Two times a day (BID) | ORAL | Status: DC
  Administered 2019-03-02 (×2): 40 mg via ORAL
  Filled 2019-03-02 (×3): qty 1

## 2019-03-02 NOTE — Progress Notes (Signed)
ANTICOAGULATION CONSULT NOTE - Initial Consult  Pharmacy Consult for Warfarin Indication: DVT and a-fib  Allergies  Allergen Reactions  . Penicillins Itching, Rash and Other (See Comments)    Tolerated amoxicillin, unasyn, zosyn & cephalosporins in the past. Did it involve swelling of the face/tongue/throat, SOB, or low BP? No Did it involve sudden or severe rash/hives, skin peeling, or any reaction on the inside of your mouth or nose? No Did you need to seek medical attention at a hospital or doctor's office? Unknown When did it last happen?5+ years If all above answers are "NO", may proceed with cephalosporin use.     Patient Measurements: Height: 5\' 6"  (167.6 cm) Weight: 140 lb (63.5 kg) IBW/kg (Calculated) : 59.3   Vital Signs: Temp: 99 F (37.2 C) (11/28 2100) Temp Source: Oral (11/28 2100) BP: 144/57 (11/29 0230) Pulse Rate: 62 (11/29 0230)  Labs: Recent Labs    03/01/19 2145  HGB 9.4*  HCT 29.1*  PLT 228  LABPROT 23.8*  INR 2.1*  CREATININE 1.46*    Estimated Creatinine Clearance: 22.5 mL/min (A) (by C-G formula based on SCr of 1.46 mg/dL (H)).   Medical History: Past Medical History:  Diagnosis Date  . Acute respiratory failure ([place])   . Anemia    previous blood transfusions  . Arthritis    "all over"  . Asthma   . Atrial flutter ([place])   . Bradycardia    requiring previous d/c of BB and reduction of amiodarone  . CAD (coronary artery disease)    nonobstructive per notes  . Chronic diastolic CHF (congestive heart failure) ([place])   . CKD (chronic kidney disease), stage III   . Complication of blood transfusion    "got the wrong blood type at [facility] in ~ 2015; no adverse reaction that we are aware of"/daughter, [name] (01/27/2016)  . COPD (chronic obstructive pulmonary disease) ([place])   . Depression    "light case"  . DVT (deep venous thrombosis) ([place]) 01/2016   a. LLE DVT 01/2016 - switched from Eliquis to Coumadin.  [name] Dyspnea    with  some activity  . Gastric stenosis    a. s/p stomach tube  . GERD (gastroesophageal reflux disease)   . History of blood transfusion    "several" (01/27/2016)  . History of stomach ulcers   . Hyperlipidemia   . Hypertension   . Hypothyroidism   . On home oxygen therapy    "2L; 9PM til 9AM" (06/27/2017)  . PAD (peripheral artery disease) ([place])    a. prior gangrene, toe amputations, intervention.  [name] PAF (paroxysmal atrial fibrillation) ([place])   . Paraesophageal hernia   . Perforated gastric ulcer ([place])   . Pneumonia    "a few times" (06/27/2017)  . Seasonal allergies   . SIADH (syndrome of inappropriate ADH production) ([place])    [name] 01/10/2015  . Small bowel obstruction ([place])    "I don't know how many" (01/11/2015)  . Stroke [facility])    several- no residual  . Type II diabetes mellitus ([place])    "related to prednisone use  for > 20 yr; once predinose stopped; no more DM RX" (01/27/2016)  . UTI (urinary tract infection) 02/08/2016  . Ventral hernia with bowel obstruction     Medications:  Scheduled:  . amLODipine  5 mg Oral Daily  . atorvastatin  10 mg Oral Daily  . enoxaparin (LOVENOX) injection  30 mg Subcutaneous Q24H  . furosemide  20 mg Oral Daily  . gabapentin  300 mg Oral BID  . hydrALAZINE  50 mg Oral TID  . insulin aspart  0-15 Units Subcutaneous TID WC  . insulin aspart  0-5 Units Subcutaneous QHS  . Insulin Glargine  5 Units Subcutaneous QHS  . isosorbide mononitrate  60 mg Oral Daily  . pantoprazole  40 mg Oral BID  . sertraline  50 mg Oral Daily   Infusions:  . sodium chloride 75 mL/hr at 03/02/19 0141    Assessment: 89 yoF on PTA warfarin for Afib and DVT admitted when J-tube fell out. HD 5 mg daily except 7.5 mg on Sundays. INR = 2.1 on admission. LD 11/28 0900  Goal of Therapy:  INR 2-3    Plan:  Daily PT/INR  Lawana Pai R 03/02/2019,2:38 AM

## 2019-03-02 NOTE — Progress Notes (Signed)
Dr. Claria Dice paged about BP 172/60, patient is calm/ resting Regulatory affairs officer will give scheduled medications). MD says medications can be given with small sips of water (NPO diet). Norlene Duel RN, BSN

## 2019-03-02 NOTE — H&P (Addendum)
PCP:   [name], NP   Chief Complaint: J-tube fell out   HPI: This is a 85 year old female who presents from home. She has multiple medical issues including pyloric stenosis and a J-tube.  Earlier today her J-tube fell out. Today she started feeling a bit sore around her feeding tube. She told her daughter and asked her to change the dressing, which her daughter did. The site remained sore. She ate lunch and felt excessively full and constipated. The patient uses her feeding tube at night for supplementation. She eats soft meals in day. She takes her medications by mouth.  She prepared to go to bed that night, she pulled off her clothes and felt fluid running down her abdomen, onto her legs and the floor. She coughed more fluid ran out. Her tube eventually fell out in the bathroom. She denies any significant discomfort. Her daughter called 911 and she was brought to the ER. This is the 2nd time her tube fell out.    In the ER a temporary 22 French Foley was placed by the ER physician. The Hospitalist have been asked to obs the patient so IR can replace the tube in the AM  All history provided by the patient who is alert and oriented. She uses a walker at baseline.   Review of Systems:  The patient denies anorexia, fever, weight loss,, vision loss, decreased hearing, hoarseness, chest pain, syncope, dyspnea on exertion, peripheral edema, balance deficits, hemoptysis, abdominal pain, melena, hematochezia, severe indigestion/heartburn, hematuria, incontinence, genital sores, muscle weakness, suspicious skin lesions, transient blindness, difficulty walking, depression, unusual weight change, abnormal bleeding, enlarged lymph nodes, angioedema, and breast masses.  Past Medical History: Past Medical History:  Diagnosis Date  . Acute respiratory failure ([place])   . Anemia    previous blood transfusions  . Arthritis    "all over"  . Asthma   . Atrial flutter ([place])   . Bradycardia    requiring previous d/c of BB and reduction of amiodarone  . CAD (coronary artery disease)    nonobstructive per notes  . Chronic diastolic CHF (congestive heart failure) ([place])   . CKD (chronic kidney disease), stage III   . Complication of blood transfusion    "got the wrong blood type at [facility] in ~ 2015; no adverse reaction that we are aware of"/daughter, [name] (01/27/2016)  . COPD (chronic obstructive pulmonary disease) ([place])   . Depression    "light case"  . DVT (deep venous thrombosis) ([place]) 01/2016   a. LLE DVT 01/2016 - switched from Eliquis to Coumadin.  [name] Dyspnea    with some activity  . Gastric stenosis    a. s/p stomach tube  . GERD (gastroesophageal reflux disease)   . History of blood transfusion    "several" (01/27/2016)  . History of stomach ulcers   . Hyperlipidemia   . Hypertension   . Hypothyroidism   . On home oxygen therapy    "2L; 9PM til 9AM" (06/27/2017)  . PAD (peripheral artery disease) ([place])    a. prior gangrene, toe amputations, intervention.  [name] PAF (paroxysmal atrial fibrillation) ([place])   . Paraesophageal hernia   . Perforated gastric ulcer ([place])   . Pneumonia    "a few times" (06/27/2017)  . Seasonal allergies   . SIADH (syndrome of inappropriate ADH production) ([place])    [name] 01/10/2015  . Small bowel obstruction ([place])    "I don't know how many" (01/11/2015)  . Stroke [facility])  several- no residual  . Type II diabetes mellitus ([place])    "related to prednisone use  for > 20 yr; once predinose stopped; no more DM RX" (01/27/2016)  . UTI (urinary tract infection) 02/08/2016  . Ventral hernia with bowel obstruction    Past Surgical History:  Procedure Laterality Date  . ABDOMINAL AORTOGRAM N/A 09/21/2016   Procedure: Abdominal Aortogram;  Surgeon: Waynetta Sandy, MD;  Location: [place] [facility];  Service: Cardiovascular;  Laterality: N/A;  . AMPUTATION Left 09/25/2016   Procedure: AMPUTATION DIGIT- LEFT 2ND AND 3RD TOES;  Surgeon:  Rosetta Posner, MD;  Location: [place];  Service: Vascular;  Laterality: Left;  . AMPUTATION Right 05/03/2018   Procedure: AMPUTATION RIGHT GREAT TOE;  Surgeon: Rosetta Posner, MD;  Location: [place];  Service: Vascular;  Laterality: Right;  . CATARACT EXTRACTION W/ INTRAOCULAR LENS  IMPLANT, BILATERAL    . CHOLECYSTECTOMY OPEN    . COLECTOMY    . ESOPHAGOGASTRODUODENOSCOPY N/A 01/19/2014   Procedure: ESOPHAGOGASTRODUODENOSCOPY (EGD);  Surgeon: Irene Shipper, MD;  [facility];  Service: Endoscopy;  Laterality: N/A;  . ESOPHAGOGASTRODUODENOSCOPY N/A 01/20/2014   Procedure: ESOPHAGOGASTRODUODENOSCOPY (EGD);  Surgeon: Irene Shipper, MD;  Location: [facility];  Service: Endoscopy;  Laterality: N/A;  . ESOPHAGOGASTRODUODENOSCOPY N/A 03/19/2014   Procedure: ESOPHAGOGASTRODUODENOSCOPY (EGD);  Surgeon: Milus Banister, MD;  Location: [facility];  Service: Endoscopy;  Laterality: N/A;  . ESOPHAGOGASTRODUODENOSCOPY N/A 07/08/2015   Procedure: ESOPHAGOGASTRODUODENOSCOPY (EGD);  Surgeon: Doran Stabler, MD;  Location: [facility]COPY;  Service: Endoscopy;  Laterality: N/A;  . ESOPHAGOGASTRODUODENOSCOPY (EGD) WITH PROPOFOL N/A 09/15/2015   Procedure: ESOPHAGOGASTRODUODENOSCOPY (EGD) WITH PROPOFOL;  Surgeon: Manus Gunning, MD;  [facility];  Service: Gastroenterology;  Laterality: N/A;  . GASTROJEJUNOSTOMY     hx/notes 01/10/2015  . GASTROJEJUNOSTOMY N/A 09/23/2015   Procedure: OPEN GASTROJEJUNOSTOMY TUBE PLACEMENT;  Surgeon: Arta Bruce Kinsinger, MD;  [facility]: [facility];  Service: General;  Laterality: N/A;  . GLAUCOMA SURGERY Bilateral   . HERNIA REPAIR  2015  . IR CM INJ ANY COLONIC TUBE W/FLUORO  01/05/2017  . IR CM INJ ANY COLONIC TUBE W/FLUORO  06/07/2017  . IR CM INJ ANY COLONIC TUBE W/FLUORO  11/05/2017  . IR CM INJ ANY COLONIC TUBE W/FLUORO  09/18/2018  . IR GENERIC HISTORICAL  01/07/2016   IR GJ TUBE CHANGE 01/07/2016 Jacqulynn Cadet, MD [facility]  . IR GENERIC HISTORICAL   01/27/2016   IR MECH REMOV OBSTRUC MAT ANY COLON TUBE W/FLUORO 01/27/2016 Markus Daft, MD [facility]  . IR GENERIC HISTORICAL  02/07/2016   IR PATIENT EVAL TECH 0-60 MINS Darrell K Allred, PA-C [facility]  . IR GENERIC HISTORICAL  02/08/2016   IR GJ TUBE CHANGE 02/08/2016 Greggory Keen, MD [facility]  . IR GENERIC HISTORICAL  01/06/2016   IR GJ TUBE CHANGE 01/06/2016 [facility] OUT REF  . IR GENERIC HISTORICAL  05/02/2016   IR CM INJ ANY COLONIC TUBE W/FLUORO 05/02/2016 Arne Cleveland, MD [facility]  . IR GENERIC HISTORICAL  05/15/2016   IR GJ TUBE CHANGE 05/15/2016 Sandi Mariscal, MD [facility]  . IR GENERIC HISTORICAL  06/28/2016   IR GJ TUBE CHANGE 06/28/2016 [facility]  . IR GJ TUBE CHANGE  02/20/2017  . IR GJ TUBE CHANGE  05/10/2017  . IR GJ TUBE CHANGE  07/06/2017  . IR GJ TUBE CHANGE  08/02/2017  . IR GJ TUBE CHANGE  10/15/2017  . IR GJ TUBE CHANGE  12/26/2017  . IR [place] TUBE CHANGE  04/08/2018  . IR [place] TUBE CHANGE  06/19/2018  . IR PATIENT EVAL TECH 0-60 MINS  10/19/2016  . IR PATIENT EVAL TECH 0-60 MINS  12/25/2016  . IR PATIENT EVAL TECH 0-60 MINS  01/29/2017  . IR PATIENT EVAL TECH 0-60 MINS  04/04/2017  . IR PATIENT EVAL TECH 0-60 MINS  04/30/2017  . IR PATIENT EVAL TECH 0-60 MINS  07/31/2017  . IR REPLC DUODEN/JEJUNO TUBE PERCUT W/FLUORO  11/14/2016  . LAPAROTOMY N/A 01/20/2015   Procedure: EXPLORATORY LAPAROTOMY;  Surgeon: Coralie Keens, MD;  Location: [facility];  Service: General;  Laterality: N/A;  . LOWER EXTREMITY ANGIOGRAPHY Left 09/21/2016   Procedure: Lower Extremity Angiography;  Surgeon: Waynetta Sandy, MD;  Location: [place] [facility];  Service: Cardiovascular;  Laterality: Left;  . LOWER EXTREMITY ANGIOGRAPHY Right 06/27/2017   Procedure: Lower Extremity Angiography;  Surgeon: Waynetta Sandy, MD;  Location: [place] [facility];  Service: Cardiovascular;  Laterality: Right;  . LYSIS OF ADHESION N/A 01/20/2015   Procedure: LYSIS OF ADHESIONS < 1 HOUR;  Surgeon:  Coralie Keens, MD;  Location: [place];  Service: General;  Laterality: N/A;  . PERIPHERAL VASCULAR BALLOON ANGIOPLASTY Left 09/21/2016   Procedure: Peripheral Vascular Balloon Angioplasty;  Surgeon: Waynetta Sandy, MD;  Location: [place] [facility];  Service: Cardiovascular;  Laterality: Left;  drug coated balloon  . PERIPHERAL VASCULAR BALLOON ANGIOPLASTY Right 06/27/2017   Procedure: PERIPHERAL VASCULAR BALLOON ANGIOPLASTY;  Surgeon: Waynetta Sandy, MD;  Location: [place] [facility];  Service: Cardiovascular;  Laterality: Right;  SFA/TPTRUNK  . TONSILLECTOMY    . TUBAL LIGATION    . VENTRAL HERNIA REPAIR  2015   incarcerated ventral hernia ([facility] 09/2013)/notes 01/10/2015    Medications: Prior to Admission medications   Medication Sig Start Date End Date Taking? Authorizing Provider  acetaminophen (TYLENOL) 500 MG tablet Take 500 mg by mouth every 6 (six) hours as needed (pain).    Yes [provider]  Amino Acids-Protein Hydrolys (FEEDING SUPPLEMENT, PRO-STAT SUGAR FREE 64,) LIQD Take 30 mLs by mouth 2 (two) times daily.   Yes [provider]  amLODipine (NORVASC) 5 MG tablet Take 1 tablet (5 mg total) by mouth daily. 11/26/18 11/26/19 Yes Spongberg, Audie Pinto, MD  atorvastatin (LIPITOR) 10 MG tablet Take 1 tablet (10 mg total) by mouth daily. 09/25/18  Yes [name], NP  b complex vitamins tablet Take 1 tablet by mouth daily.    Yes [provider]  calcium-vitamin D (OSCAL WITH D) 500-200 MG-UNIT per tablet Take 1 tablet by mouth daily with breakfast.    Yes [provider]  Carboxymethylcellulose Sodium (ARTIFICIAL TEARS OP) Place 1 drop into both eyes 4 (four) times daily.   Yes [provider]  collagenase (SANTYL) ointment Apply 1 application topically daily as needed (wound care).    Yes [provider]  cycloSPORINE (RESTASIS) 0.05 % ophthalmic emulsion USE 1 DROP INTO BOTH EYES TWICE DAILY 01/29/17  Yes  Eulas Post, Monica, DO  DULERA 200-5 MCG/ACT AERO TAKE TWO PUFFS BY MOUTH TWICE DAILY 01/13/19  Yes [name], NP  feeding supplement (OSMOLITE 1 CAL) LIQD Take 711 mLs by mouth See admin instructions. For 12 hours 2100 - 0900   Yes [provider]  ferrous sulfate 325 (65 FE) MG tablet TAKE 1 TABLET BY MOUTH EVERY DAY 11/01/18  Yes [name], NP  fluticasone (FLONASE) 50 MCG/ACT nasal spray  SPRAY 2 SPRAYS INTO EACH NOSTRIL EVERY DAY Patient taking differently: Place 2 sprays into both nostrils daily. SPRAY 2 SPRAYS INTO EACH NOSTRIL EVERY DAY 02/06/19  Yes [name], NP  furosemide (LASIX) 40 MG tablet Alternate taking 40 mg and 20 mg every other day 02/19/19  Yes Imogene Burn, PA-C  gabapentin (NEURONTIN) 300 MG capsule TAKE 1 CAPSULE BY MOUTH TWICE A DAY 02/06/19  Yes [name], NP  gentamicin cream (GARAMYCIN) 0.1 % Apply 1 application topically 3 (three) times daily as needed (dry skin).    Yes [provider]  hydrALAZINE (APRESOLINE) 50 MG tablet TAKE 1 TABLET BY MOUTH THREE TIMES A DAY 02/06/19  Yes Dorothy Spark, MD  insulin aspart (NOVOLOG) 100 UNIT/ML FlexPen Inject 0-6 Units into the skin 3 (three) times daily with meals. New sliding Scale If BS is 151 - 251 = 2 units,  If BS is 251 -  350 = 4 units,  If BS is 351 - 450 = 6 units Call provider if BS runs more than 400 02/03/19  Yes Eubanks, [name], NP  Insulin Glargine (LANTUS SOLOSTAR) 100 UNIT/ML Solostar Pen Inject 8 Units into the skin at bedtime. 02/20/19  Yes [name], NP  ipratropium-albuterol (DUONEB) 0.5-2.5 (3) MG/3ML SOLN Take 3 mLs by nebulization every 6 (six) hours as needed (sob). Use 3 times daily x 4 days then every 6 hours as needed. 03/21/16  Yes Eileen Stanford, PA-C  isosorbide mononitrate (IMDUR) 30 MG 24 hr tablet TAKE 2 TABLETS BY MOUTH DAILY. PLEASE MAKE ANNUAL APPT WITH DR. Meda Coffee FOR FUTURE REFILLS. THANK YOU. 1ST ATTEMPT. Patient taking  differently: Take 60 mg by mouth daily.  12/27/18  Yes Dorothy Spark, MD  latanoprost (XALATAN) 0.005 % ophthalmic solution Place 1 drop into both eyes at bedtime. 06/02/14  Yes Blanchie Serve, MD  levalbuterol [facility] HFA) 45 MCG/ACT inhaler Inhale 2 puffs into the lungs every 6 (six) hours as needed for wheezing or shortness of breath. 02/03/16  Yes Gildardo Cranker, DO  levothyroxine (SYNTHROID, LEVOTHROID) 125 MCG tablet Take 1 tablet (125 mcg total) by mouth daily. 04/18/18  Yes [name], NP  loratadine (CLARITIN) 10 MG tablet Take 10 mg by mouth daily as needed for allergies.   Yes [provider]  Multiple Vitamin (MULTIVITAMIN WITH MINERALS) TABS tablet Take 1 tablet by mouth daily.   Yes [provider]  nystatin (MYCOSTATIN/NYSTOP) powder Apply 1 g topically as needed (skin irritation).    Yes [provider]  OXYGEN Inhale 2 L into the lungs at bedtime.   Yes [provider]  pantoprazole (PROTONIX) 40 MG tablet TAKE 1 TABLET BY MOUTH TWICE A DAY 12/24/18  Yes [name], NP  sertraline (ZOLOFT) 50 MG tablet Take 1 tablet (50 mg total) by mouth daily. 01/09/19  Yes Medina-Vargas, Monina C, NP  warfarin (COUMADIN) 2.5 MG tablet TAKE AS DIRECTED BY COUMADIN CLINIC. Patient taking differently: Take 5-7.5 mg by mouth See admin instructions. Take 5mg  by mouth daily, except on Sunday, take 7.5mg  by mouth 09/02/18  Yes Dorothy Spark, MD  Water For Irrigation, Sterile (FREE WATER) SOLN Place 150 mLs into feeding tube 3 (three) times daily.    Yes [provider]  White Petrolatum-Mineral Oil (SYSTANE NIGHTTIME) OINT Place 1 application into both eyes at bedtime.    Yes [provider]  Insulin Pen Needle (B-D UF III MINI PEN NEEDLES) 31G X 5 MM MISC  Use twice daily with giving insulin injections. Dx E11.9 07/17/18   [name], NP  oxyCODONE (ROXICODONE) 5 MG immediate release tablet Take 1 tablet (5 mg total) by mouth  every 4 (four) hours as needed for severe pain. May take 1/2 tab q4hrs as needed for mild pain. 06/14/18   [name], NP    Allergies:   Allergies  Allergen Reactions  . Penicillins Itching, Rash and Other (See Comments)    Tolerated amoxicillin, unasyn, zosyn & cephalosporins in the past. Did it involve swelling of the face/tongue/throat, SOB, or low BP? No Did it involve sudden or severe rash/hives, skin peeling, or any reaction on the inside of your mouth or nose? No Did you need to seek medical attention at a hospital or doctor's office? Unknown When did it last happen?5+ years If all above answers are "NO", may proceed with cephalosporin use.     Social History:  reports that she has never smoked. She quit smokeless tobacco use about 38 years ago.  Her smokeless tobacco use included snuff. She reports that she does not drink alcohol or use drugs.  Family History: Family History  Problem Relation Age of Onset  . Stroke Mother   . Hypertension Mother   . Diabetes Brother   . Glaucoma Son   . Glaucoma Son   . Heart attack Neg Hx   . Colon cancer Neg Hx   . Stomach cancer Neg Hx     Physical Exam: Vitals:   03/01/19 2330 03/02/19 0000 03/02/19 0030 03/02/19 0100  BP: (!) 165/96 (!) 169/64 (!) 176/82 (!) 156/62  Pulse: 64 66 72 69  Resp: 18  17 20   Temp:      TempSrc:      SpO2: 100% 100% 100% 100%  Weight:      Height:        General:  Alert and oriented times three, well developed elderly female, no acute distress Eyes: PERRLA, pink conjunctiva, no scleral icterus ENT: Moist oral mucosa, neck supple, no thyromegaly Lungs: clear to ascultation, no wheeze, no crackles, no use of accessory muscles Cardiovascular: regular rate and rhythm, no regurgitation, no gallops, no murmurs. No carotid bruits, no JVD Abdomen: soft, positive BS, non-tender, non-distended, no organomegaly, not an acute abdomen. Well healed surgical scar, french tube in place. No  erythema GU: not examined Neuro: CN II - XII grossly intact, sensation intact Musculoskeletal: strength 5/5 all extremities, no clubbing, cyanosis or edema Skin: no rash, no subcutaneous crepitation, small decubitus. Toe amp B/L LE, healing well. Small ulcer left foot.........................  Psych: appropriate patient   Labs on Admission:  Recent Labs    03/01/19 2145  NA 136  K 4.4  CL 102  CO2 25  GLUCOSE 91  BUN 60*  CREATININE 1.46*  CALCIUM 9.5   No results for input(s): AST, ALT, ALKPHOS, BILITOT, PROT, ALBUMIN in the last 72 hours. No results for input(s): LIPASE, AMYLASE in the last 72 hours. Recent Labs    03/01/19 2145  WBC 6.0  NEUTROABS 3.6  HGB 9.4*  HCT 29.1*  MCV 93.9  PLT 228   No results for input(s): CKTOTAL, CKMB, CKMBINDEX, TROPONINI in the last 72 hours. Invalid input(s): POCBNP No results for input(s): DDIMER in the last 72 hours. No results for input(s): HGBA1C in the last 72 hours. No results for input(s): CHOL, HDL, LDLCALC, TRIG, CHOLHDL, LDLDIRECT in the last 72 hours. No results for input(s): TSH, T4TOTAL, T3FREE, THYROIDAB in the  last 72 hours.  Invalid input(s): FREET3 No results for input(s): VITAMINB12, FOLATE, FERRITIN, TIBC, IRON, RETICCTPCT in the last 72 hours.  Micro Results: No results found for this or any previous visit (from the past 240 hour(s)).   Radiological Exams on Admission: No results found.  Assessment/Plan Present on Admission: J-tube fell out -Bring in for overnight observation on med telemetry -We will consult IR to replace J-tube in a.m. -NPO  -coumadin held. INR 2.1. will reorder INR for AM  . Essential hypertension, benign -Poorly controlled.  Home meds resumed tonight -As needed blood pressure medications ordered  . Cerebrovascular disease . Chronic kidney insufficiency, stage 3 (moderate) . COPD with asthma ([place]) . Diabetic neuropathy ([place]) . Depression . Dyslipidemia . Gastroparesis .  Hypothyroidism . Malnutrition of moderate degree . PAD (peripheral artery disease) ([place]) . Persistent atrial fibrillation . Pressure ulcer . PVD (peripheral vascular disease) ([place]) . Pyloric stenosis -All other issues stable at baseline, home meds resumed -Home meds, nebulizer as needed -Fingersticks blood sugars, and sliding scale ordered q4 hours. Lantus dose decreased to 5 units QHS  Seydou Hearns 03/02/2019, 1:10 AM

## 2019-03-02 NOTE — ED Notes (Signed)
ED TO INPATIENT HANDOFF REPORT  ED Nurse Name and Phone #:  Anderson [place] [phone]  S Name/Age/Gender Shannon [name] 85 y.o. female Room/Bed: WA13/WA13  Code Status   Code Status: Full Code  Home/SNF/Other Home Patient oriented to: self, place, time and situation Is this baseline? Yes   Triage Complete: Triage complete  Chief Complaint J Tube Out   Triage Note Pt arrived from home via [facility] due to accidental removal of J tube. Pt is A&Ox4. Daughter is caretaker and will be in route to hospital shortly.   BP 158/125 HR 72 RR 18 SpO2 99%  Temp 97.7  CBG 120   Allergies Allergies  Allergen Reactions  . Penicillins Itching, Rash and Other (See Comments)    Tolerated amoxicillin, unasyn, zosyn & cephalosporins in the past. Did it involve swelling of the face/tongue/throat, SOB, or low BP? No Did it involve sudden or severe rash/hives, skin peeling, or any reaction on the inside of your mouth or nose? No Did you need to seek medical attention at a hospital or doctor's office? Unknown When did it last happen?5+ years If all above answers are "NO", may proceed with cephalosporin use.     Level of Care/Admitting Diagnosis ED Disposition    ED Disposition Condition Comment   Admit  Hospital Area: [facility] [[id number]]  Level of Care: Med-Surg [16]  Covid Evaluation: Confirmed COVID Negative  Date Laboratory Confirmed COVID Negative: 03/02/2019  Diagnosis: Jejunostomy tube fell out [818563]  Admitting Physician: [place], [place]  Attending Physician: Quintella Baton [[id number]]  PT Class (Do Not Modify): Observation [104]  PT Acc Code (Do Not Modify): Observation [10022]       B Medical/Surgery History Past Medical History:  Diagnosis Date  . Acute respiratory failure ([place])   . Anemia    previous blood transfusions  . Arthritis    "all over"  . Asthma   . Atrial flutter ([place])   . Bradycardia    requiring previous d/c of BB and reduction  of amiodarone  . CAD (coronary artery disease)    nonobstructive per notes  . Chronic diastolic CHF (congestive heart failure) ([place])   . CKD (chronic kidney disease), stage III   . Complication of blood transfusion    "got the wrong blood type at [facility] in ~ 2015; no adverse reaction that we are aware of"/daughter, Shannon [name] (01/27/2016)  . COPD (chronic obstructive pulmonary disease) ([place])   . Depression    "light case"  . DVT (deep venous thrombosis) ([place]) 01/2016   a. LLE DVT 01/2016 - switched from Eliquis to Coumadin.  [name] Dyspnea    with some activity  . Gastric stenosis    a. s/p stomach tube  . GERD (gastroesophageal reflux disease)   . History of blood transfusion    "several" (01/27/2016)  . History of stomach ulcers   . Hyperlipidemia   . Hypertension   . Hypothyroidism   . On home oxygen therapy    "2L; 9PM til 9AM" (06/27/2017)  . PAD (peripheral artery disease) ([place])    a. prior gangrene, toe amputations, intervention.  [name] PAF (paroxysmal atrial fibrillation) ([place])   . Paraesophageal hernia   . Perforated gastric ulcer ([place])   . Pneumonia    "a few times" (06/27/2017)  . Seasonal allergies   . SIADH (syndrome of inappropriate ADH production) ([place])    [name] 01/10/2015  . Small bowel obstruction ([place])    "I don't know how many" (01/11/2015)  .  Stroke [facility])    several- no residual  . Type II diabetes mellitus ([place])    "related to prednisone use  for > 20 yr; once predinose stopped; no more DM RX" (01/27/2016)  . UTI (urinary tract infection) 02/08/2016  . Ventral hernia with bowel obstruction    Past Surgical History:  Procedure Laterality Date  . ABDOMINAL AORTOGRAM N/A 09/21/2016   Procedure: Abdominal Aortogram;  Surgeon: Waynetta Sandy, MD;  Location: [place] [facility];  Service: Cardiovascular;  Laterality: N/A;  . AMPUTATION Left 09/25/2016   Procedure: AMPUTATION DIGIT- LEFT 2ND AND 3RD TOES;  Surgeon: Rosetta Posner, MD;  Location: [place];   Service: Vascular;  Laterality: Left;  . AMPUTATION Right 05/03/2018   Procedure: AMPUTATION RIGHT GREAT TOE;  Surgeon: Rosetta Posner, MD;  Location: [place];  Service: Vascular;  Laterality: Right;  . CATARACT EXTRACTION W/ INTRAOCULAR LENS  IMPLANT, BILATERAL    . CHOLECYSTECTOMY OPEN    . COLECTOMY    . ESOPHAGOGASTRODUODENOSCOPY N/A 01/19/2014   Procedure: ESOPHAGOGASTRODUODENOSCOPY (EGD);  Surgeon: Irene Shipper, MD;  [facility];  Service: Endoscopy;  Laterality: N/A;  . ESOPHAGOGASTRODUODENOSCOPY N/A 01/20/2014   Procedure: ESOPHAGOGASTRODUODENOSCOPY (EGD);  Surgeon: Irene Shipper, MD;  Location: [facility];  Service: Endoscopy;  Laterality: N/A;  . ESOPHAGOGASTRODUODENOSCOPY N/A 03/19/2014   Procedure: ESOPHAGOGASTRODUODENOSCOPY (EGD);  Surgeon: Milus Banister, MD;  Location: [facility];  Service: Endoscopy;  Laterality: N/A;  . ESOPHAGOGASTRODUODENOSCOPY N/A 07/08/2015   Procedure: ESOPHAGOGASTRODUODENOSCOPY (EGD);  Surgeon: Doran Stabler, MD;  Location[facility];  Service: Endoscopy;  Laterality: N/A;  . ESOPHAGOGASTRODUODENOSCOPY (EGD) WITH PROPOFOL N/A 09/15/2015   Procedure: ESOPHAGOGASTRODUODENOSCOPY (EGD) WITH PROPOFOL;  Surgeon: Manus Gunning, MD;  Location: [facility];  Service: Gastroenterology;  Laterality: N/A;  . GASTROJEJUNOSTOMY     hx/notes 01/10/2015  . GASTROJEJUNOSTOMY N/A 09/23/2015   Procedure: OPEN GASTROJEJUNOSTOMY TUBE PLACEMENT;  Surgeon: Arta Bruce Kinsinger, MD;  [facility]: [facility];  Service: General;  Laterality: N/A;  . GLAUCOMA SURGERY Bilateral   . HERNIA REPAIR  2015  . IR CM INJ ANY COLONIC TUBE W/FLUORO  01/05/2017  . IR CM INJ ANY COLONIC TUBE W/FLUORO  06/07/2017  . IR CM INJ ANY COLONIC TUBE W/FLUORO  11/05/2017  . IR CM INJ ANY COLONIC TUBE W/FLUORO  09/18/2018  . IR GENERIC HISTORICAL  01/07/2016   IR GJ TUBE CHANGE 01/07/2016 Jacqulynn Cadet, MD [facility]  . IR GENERIC HISTORICAL  01/27/2016   IR MECH REMOV OBSTRUC MAT  ANY COLON TUBE W/FLUORO 01/27/2016 Markus Daft, MD [facility]  . IR GENERIC HISTORICAL  02/07/2016   IR PATIENT EVAL TECH 0-60 MINS Darrell K Allred, PA-C [facility]  . IR GENERIC HISTORICAL  02/08/2016   IR GJ TUBE CHANGE 02/08/2016 Greggory Keen, MD [facility]  . IR GENERIC HISTORICAL  01/06/2016   IR GJ TUBE CHANGE 01/06/2016 [facility] OUT REF  . IR GENERIC HISTORICAL  05/02/2016   IR CM INJ ANY COLONIC TUBE W/FLUORO 05/02/2016 Arne Cleveland, MD [facility]  . IR GENERIC HISTORICAL  05/15/2016   IR GJ TUBE CHANGE 05/15/2016 Sandi Mariscal, MD [facility]  . IR GENERIC HISTORICAL  06/28/2016   IR GJ TUBE CHANGE 06/28/2016 [facility]  . IR GJ TUBE CHANGE  02/20/2017  . IR GJ TUBE CHANGE  05/10/2017  . IR GJ TUBE CHANGE  07/06/2017  . IR GJ TUBE CHANGE  08/02/2017  . IR GJ TUBE CHANGE  10/15/2017  .  IR GJ TUBE CHANGE  12/26/2017  . IR [place] TUBE CHANGE  04/08/2018  . IR [place] TUBE CHANGE  06/19/2018  . IR PATIENT EVAL TECH 0-60 MINS  10/19/2016  . IR PATIENT EVAL TECH 0-60 MINS  12/25/2016  . IR PATIENT EVAL TECH 0-60 MINS  01/29/2017  . IR PATIENT EVAL TECH 0-60 MINS  04/04/2017  . IR PATIENT EVAL TECH 0-60 MINS  04/30/2017  . IR PATIENT EVAL TECH 0-60 MINS  07/31/2017  . IR REPLC DUODEN/JEJUNO TUBE PERCUT W/FLUORO  11/14/2016  . LAPAROTOMY N/A 01/20/2015   Procedure: EXPLORATORY LAPAROTOMY;  Surgeon: Coralie Keens, MD;  Location: [place];  Service: General;  Laterality: N/A;  . LOWER EXTREMITY ANGIOGRAPHY Left 09/21/2016   Procedure: Lower Extremity Angiography;  Surgeon: Waynetta Sandy, MD;  Location: [place] [facility];  Service: Cardiovascular;  Laterality: Left;  . LOWER EXTREMITY ANGIOGRAPHY Right 06/27/2017   Procedure: Lower Extremity Angiography;  Surgeon: Waynetta Sandy, MD;  Location: [place] [facility];  Service: Cardiovascular;  Laterality: Right;  . LYSIS OF ADHESION N/A 01/20/2015   Procedure: LYSIS OF ADHESIONS < 1 HOUR;  Surgeon: Coralie Keens, MD;  Location: [place];   Service: General;  Laterality: N/A;  . PERIPHERAL VASCULAR BALLOON ANGIOPLASTY Left 09/21/2016   Procedure: Peripheral Vascular Balloon Angioplasty;  Surgeon: Waynetta Sandy, MD;  Location: [place] [facility];  Service: Cardiovascular;  Laterality: Left;  drug coated balloon  . PERIPHERAL VASCULAR BALLOON ANGIOPLASTY Right 06/27/2017   Procedure: PERIPHERAL VASCULAR BALLOON ANGIOPLASTY;  Surgeon: Waynetta Sandy, MD;  Location: [place] [facility];  Service: Cardiovascular;  Laterality: Right;  SFA/TPTRUNK  . TONSILLECTOMY    . TUBAL LIGATION    . VENTRAL HERNIA REPAIR  2015   incarcerated ventral hernia ([facility] 09/2013)/notes 01/10/2015     A IV Location/Drains/Wounds Patient Lines/Drains/Airways Status   Active Line/Drains/Airways    Name:   Placement date:   Placement time:   Site:   Days:   Peripheral IV 03/02/19 Right;Anterior Forearm   03/02/19    0129    Forearm   less than 1   Gastrostomy/Enterostomy Other (Comment) 22 Fr. LUQ   06/19/18    1250    LUQ   256   External Urinary Catheter   12/25/18    1630    -   67          Intake/Output Last 24 hours No intake or output data in the 24 hours ending 03/02/19 0253  Labs/Imaging Results for orders placed or performed during the hospital encounter of 03/01/19 (from the past 48 hour(s))  CBC with Differential     Status: Abnormal   Collection Time: 03/01/19  9:45 PM  Result Value Ref Range   WBC 6.0 4.0 - 10.5 K/uL   RBC 3.10 (L) 3.87 - 5.11 MIL/uL   Hemoglobin 9.4 (L) 12.0 - 15.0 g/dL   HCT 29.1 (L) 36.0 - 46.0 %   MCV 93.9 80.0 - 100.0 fL   MCH 30.3 26.0 - 34.0 pg   MCHC 32.3 30.0 - 36.0 g/dL   RDW 15.8 (H) 11.5 - 15.5 %   Platelets 228 150 - 400 K/uL   nRBC 0.0 0.0 - 0.2 %   Neutrophils Relative % 60 %   Neutro Abs 3.6 1.7 - 7.7 K/uL   Lymphocytes Relative 15 %   Lymphs Abs 0.9 0.7 - 4.0 K/uL   Monocytes Relative 13 %   Monocytes Absolute 0.8 0.1 -  1.0 K/uL   Eosinophils Relative 10 %   Eosinophils  Absolute 0.6 (H) 0.0 - 0.5 K/uL   Basophils Relative 1 %   Basophils Absolute 0.0 0.0 - 0.1 K/uL   Immature Granulocytes 1 %   Abs Immature Granulocytes 0.03 0.00 - 0.07 K/uL    Comment: Performed at [facility], [place] [address] [place], [place] [address]  Basic metabolic panel     Status: Abnormal   Collection Time: 03/01/19  9:45 PM  Result Value Ref Range   Sodium 136 135 - 145 mmol/L   Potassium 4.4 3.5 - 5.1 mmol/L   Chloride 102 98 - 111 mmol/L   CO2 25 22 - 32 mmol/L   Glucose, Bld 91 70 - 99 mg/dL   BUN 60 (H) 8 - 23 mg/dL   Creatinine, Ser 1.46 (H) 0.44 - 1.00 mg/dL   Calcium 9.5 8.9 - 10.3 mg/dL   GFR calc non Af Amer 31 (L) >60 mL/min   GFR calc Af Amer 36 (L) >60 mL/min   Anion gap 9 5 - 15    Comment: Performed at [facility], [place] [address], [place], [place] [address]  PT/INR     Status: Abnormal   Collection Time: 03/01/19  9:45 PM  Result Value Ref Range   Prothrombin Time 23.8 (H) 11.4 - 15.2 seconds   INR 2.1 (H) 0.8 - 1.2    Comment: (NOTE) INR goal varies based on device and disease states. Performed at [facility], [place] [address] [place],  [address]    *Note: Due to a large number of results and/or encounters for the requested time period, some results have not been displayed. A complete set of results can be found in Results Review.   No results found.  Pending Labs Unresulted Labs (From admission, onward)    Start     Ordered   03/09/19 0500  Creatinine, serum  (enoxaparin (LOVENOX)    CrCl < 30 ml/min)  Weekly,   R    Comments: while on enoxaparin therapy.    03/02/19 0229   03/03/19 0500  Protime-INR  Daily,   R     03/02/19 0242   03/02/19 5621  Basic metabolic panel  Tomorrow morning,   R     03/02/19 0229   03/02/19 0500  CBC  Tomorrow morning,   R     03/02/19 0229   03/02/19 0230  Hemoglobin A1c  Once,   STAT    Comments: To assess prior glycemic control    03/02/19  0229   03/02/19 0230  CBC  (enoxaparin (LOVENOX)    CrCl < 30 ml/min)  Once,   STAT    Comments: Baseline for enoxaparin therapy IF NOT ALREADY DRAWN.  Notify MD if PLT < 100 K.    03/02/19 0229   03/02/19 0230  Creatinine, serum  (enoxaparin (LOVENOX)    CrCl < 30 ml/min)  Once,   STAT    Comments: Baseline for enoxaparin therapy IF NOT ALREADY DRAWN.    03/02/19 0229   03/01/19 2323  SARS CORONAVIRUS 2 (TAT 6-24 HRS) Nasopharyngeal Nasopharyngeal Swab  (Asymptomatic/Tier 3)  Once,   STAT    Question Answer Comment  Is this test for diagnosis or screening Screening   Symptomatic for COVID-19 as defined by CDC No   Hospitalized for COVID-19 No   Admitted to ICU for COVID-19 No   Previously tested for COVID-19 Yes   Resident  in a congregate (group) care setting No   Employed in healthcare setting No   Pregnant No      03/01/19 2322          Vitals/Pain Today's Vitals   03/02/19 0100 03/02/19 0130 03/02/19 0200 03/02/19 0230  BP: (!) 156/62 (!) 158/56 (!) 161/67 (!) 144/57  Pulse: 69 65 81 62  Resp: 20 18 19 17   Temp:      TempSrc:      SpO2: 100% 100% 99% 98%  Weight:      Height:      PainSc:        Isolation Precautions No active isolations  Medications Medications  0.9 %  sodium chloride infusion ( Intravenous New Bag/Given (Non-Interop) 03/02/19 0141)  amLODipine (NORVASC) tablet 5 mg (has no administration in time range)  atorvastatin (LIPITOR) tablet 10 mg (has no administration in time range)  furosemide (LASIX) tablet 20 mg (has no administration in time range)  hydrALAZINE (APRESOLINE) tablet 50 mg (has no administration in time range)  isosorbide mononitrate (IMDUR) 24 hr tablet 60 mg (has no administration in time range)  sertraline (ZOLOFT) tablet 50 mg (has no administration in time range)  pantoprazole (PROTONIX) EC tablet 40 mg (has no administration in time range)  gabapentin (NEURONTIN) capsule 300 mg (has no administration in time range)   ipratropium-albuterol (DUONEB) 0.5-2.5 (3) MG/3ML nebulizer solution 3 mL (has no administration in time range)  loratadine (CLARITIN) tablet 10 mg (has no administration in time range)  insulin aspart (novoLOG) injection 0-15 Units (has no administration in time range)  insulin aspart (novoLOG) injection 0-5 Units (has no administration in time range)  Insulin Glargine (LANTUS) Solostar Pen 5 Units (has no administration in time range)  enoxaparin (LOVENOX) injection 30 mg (has no administration in time range)  polyethylene glycol (MIRALAX / GLYCOLAX) packet 17 g (has no administration in time range)  ondansetron (ZOFRAN) tablet 4 mg (has no administration in time range)    Or  ondansetron (ZOFRAN) injection 4 mg (has no administration in time range)    Mobility walks with device Moderate fall risk    R Recommendations: See Admitting Provider Note  Report given to: Tiffany 5W

## 2019-03-02 NOTE — Progress Notes (Addendum)
Patient arrived to unit around 0310  0400 CBG : [place], [place]

## 2019-03-02 NOTE — Progress Notes (Signed)
PROGRESS NOTE    Shannon [name]  OZH:[id number] DOB: 13-Sep-1923 DOA: 03/01/2019 PCP: Sharon Seller, NP    Brief Narrative:   Shannon [name] is a 85 year old female  with past medical history remarkable for essential hypertension, CKD stage III, COPD/asthma, depression, hypothyroidism, paroxysmal atrial fibrillation, pyloric stenosis s/p J-tube, type 2 diabetes mellitus who presents from home with dislodgment of her J-tube.  Patient noted during the night leaking from her J-tube site and when she coughed the tube fell out.  She denies any significant discomfort.  Her daughter called 911 and she was brought to the ER for further evaluation.  This is apparently her second time her tube has dislodged.  In the ER a temporary 22 French Foley was placed by the ER physician. The Hospitalist have been asked to obs the patient so IR can replace the tube  Patient lives at home with her daughter, she uses a walker at baseline.    Assessment & Plan:   Principal Problem:   Jejunostomy tube fell out Active Problems:   Essential hypertension, benign   Dyslipidemia   Persistent atrial fibrillation   Diabetic neuropathy (HCC)   Hypothyroidism   Depression   Chronic kidney insufficiency, stage 3 (moderate)   Gastroparesis   Cerebrovascular disease   Malnutrition of moderate degree   Dysphagia   PVD (peripheral vascular disease) (HCC)   Pressure ulcer   Pyloric stenosis   Warfarin anticoagulation   CHF (NYHA class IV, ACC/AHA stage D) (HCC)   COPD with asthma (HCC)   PAD (peripheral artery disease) (HCC)   Hx pyloric stenosis s/p J-tube Moderate protein calorie malnutrition J-tube dislodgment Patient presenting after dislodgment of her J-tube.  Patient utilizes this nocturnally for supplements.  Temporary Foley catheter placed by ED physician on arrival. --IR consulted for replacement, plan tomorrow  Essential hypertension: BP 114/67, well controlled. --Continue amlodipine 5 mg  p.o. daily, abide mononitrate 60 mg p.o. daily, hydralazine 50 g p.o. TID --Lasix 20mg  PO daily --Clonidine 0.1 mg q4h prn SBP >160  CKD stage IIIb Creatinine baseline past 12 months 1.3-1.5. GFR 33-40. --Creatinine 1.43 on admission, at baseline --Avoid nephrotoxins, renally dose all medications --BMP daily  COPD/asthma Currently oxygenating well on room air. --DuoNebs as needed  Depression: Continue sertraline 50 mg p.o. daily  GERD: Continue Protonix 40 mg p.o. twice daily  Hypothyroidism: Continue levothyroxine 125 mcg p.o. daily  Paroxysmal atrial fibrillation --On Coumadin for chronic anticoagulation, currently on hold pending IR replacement of J-tube --INR 2.1 --resume following J-tube replacement  Type 2 diabetes mellitus On Lantus 5 units subcutaneously nightly and insulin sliding scale at home. --Continue Lantus 5 units [place] qHS --ISS for further coverage    DVT prophylaxis: SCDs Code Status: Full code Family Communication: None present at bedside Disposition Plan: Continue observation status, IR plans for replacement of J-tube tomorrow, then plan discharge home   Consultants:   Interventional radiology  Procedures:   None  Antimicrobials:   None   Subjective: Patient seen and examined at bedside, resting comfortably.  Feels slightly cold.  Hopeful that she could get J-tube replaced today and go home; updated her that this will be done till tomorrow per IR.  No other complaints or concerns at this time.  Denies headache, no chest pain, no palpitations, no shortness of breath, no nausea/vomiting/diarrhea, no abdominal pain, no weakness, no fatigue, no paresthesias.  No acute events overnight per nursing staff.  Objective: Vitals:   03/02/19 0230 03/02/19 0322 03/02/19 0340  03/02/19 0609  BP: (!) 144/57 (!) 172/60  114/67  Pulse: 62 71  85  Resp: 17 18  16   Temp:  98.3 F (36.8 C)  (!) 97.5 F (36.4 C)  TempSrc:  Oral  Oral  SpO2: 98% 100%  98%   Weight:   65.5 kg   Height:   5\' 6"  (1.676 m)     Intake/Output Summary (Last 24 hours) at 03/02/2019 1221 Last data filed at 03/02/2019 1127 Gross per 24 hour  Intake 700 ml  Output 900 ml  Net -200 ml   Filed Weights   03/01/19 2100 03/02/19 0340  Weight: 63.5 kg 65.5 kg    Examination:  General exam: Appears calm and comfortable, elderly in appearance Respiratory system: Clear to auscultation. Respiratory effort normal. Cardiovascular system: S1 & S2 heard, RRR. No JVD, murmurs, rubs, gallops or clicks. No pedal edema. Gastrointestinal system: Abdomen is nondistended, soft and nontender. No organomegaly or masses felt. Normal bowel sounds heard.  Noted temporary Foley catheter in place. Central nervous system: Alert and oriented. No focal neurological deficits. Extremities: Symmetric 5 x 5 power. Skin: No rashes, lesions or ulcers Psychiatry: Judgement and insight appear normal. Mood & affect appropriate.     Data Reviewed: I have personally reviewed following labs and imaging studies  CBC: Recent Labs  Lab 03/01/19 2145 03/02/19 0242 03/02/19 0731  WBC 6.0 5.2 6.3  NEUTROABS 3.6  --   --   HGB 9.4* 9.2* 9.8*  HCT 29.1* 28.6* 30.0*  MCV 93.9 93.8 94.9  PLT 228 214 224   Basic Metabolic Panel: Recent Labs  Lab 03/01/19 2145 03/02/19 0242  NA 136  --   K 4.4  --   CL 102  --   CO2 25  --   GLUCOSE 91  --   BUN 60*  --   CREATININE 1.46* 1.43*  CALCIUM 9.5  --    GFR: Estimated Creatinine Clearance: 23 mL/min (A) (by C-G formula based on SCr of 1.43 mg/dL (H)). Liver Function Tests: No results for input(s): AST, ALT, ALKPHOS, BILITOT, PROT, ALBUMIN in the last 168 hours. No results for input(s): LIPASE, AMYLASE in the last 168 hours. No results for input(s): AMMONIA in the last 168 hours. Coagulation Profile: Recent Labs  Lab 03/01/19 2145 03/02/19 0731  INR 2.1* 1.9*   Cardiac Enzymes: No results for input(s): CKTOTAL, CKMB, CKMBINDEX,  TROPONINI in the last 168 hours. BNP (last 3 results) No results for input(s): PROBNP in the last 8760 hours. HbA1C: Recent Labs    03/02/19 0242  HGBA1C 6.2*   CBG: Recent Labs  Lab 03/02/19 0807 03/02/19 1200  GLUCAP 90 126*   Lipid Profile: No results for input(s): CHOL, HDL, LDLCALC, TRIG, CHOLHDL, LDLDIRECT in the last 72 hours. Thyroid Function Tests: No results for input(s): TSH, T4TOTAL, FREET4, T3FREE, THYROIDAB in the last 72 hours. Anemia Panel: No results for input(s): VITAMINB12, FOLATE, FERRITIN, TIBC, IRON, RETICCTPCT in the last 72 hours. Sepsis Labs: No results for input(s): PROCALCITON, LATICACIDVEN in the last 168 hours.  No results found for this or any previous visit (from the past 240 hour(s)).       Radiology Studies: No results found.      Scheduled Meds: . amLODipine  5 mg Oral Daily  . atorvastatin  10 mg Oral q1800  . furosemide  20 mg Oral Daily  . gabapentin  300 mg Oral BID  . hydrALAZINE  50 mg Oral TID  . insulin aspart  0-15 Units Subcutaneous TID WC  . insulin aspart  0-5 Units Subcutaneous QHS  . insulin glargine  5 Units Subcutaneous QHS  . isosorbide mononitrate  60 mg Oral Daily  . pantoprazole  40 mg Oral BID  . sertraline  50 mg Oral Daily   Continuous Infusions: . sodium chloride 75 mL/hr at 03/02/19 0141     LOS: 0 days    Time spent: 27 minutes spent on chart review, discussion with nursing staff, consultants, updating family and interview/physical exam; more than 50% of that time was spent in counseling and/or coordination of care.    Alvira Philips [place], DO [facility] 03/02/2019, 12:21 PM

## 2019-03-03 ENCOUNTER — Inpatient Hospital Stay (HOSPITAL_COMMUNITY): Payer: Medicare Other

## 2019-03-03 ENCOUNTER — Encounter (HOSPITAL_COMMUNITY): Payer: Self-pay | Admitting: Interventional Radiology

## 2019-03-03 HISTORY — PX: IR GJ TUBE CHANGE: IMG1440

## 2019-03-03 LAB — GLUCOSE, CAPILLARY
Glucose-Capillary: 106 mg/dL — ABNORMAL HIGH (ref 70–99)
Glucose-Capillary: 92 mg/dL (ref 70–99)
Glucose-Capillary: 97 mg/dL (ref 70–99)
Glucose-Capillary: 80 mg/dL (ref 70–99)
Glucose-Capillary: 79 mg/dL (ref 70–99)
Glucose-Capillary: 71 mg/dL (ref 70–99)

## 2019-03-03 LAB — CBC
HCT: 27.5 % — ABNORMAL LOW (ref 36.0–46.0)
Hemoglobin: 9 g/dL — ABNORMAL LOW (ref 12.0–15.0)
MCH: 30.6 pg (ref 26.0–34.0)
MCHC: 32.7 g/dL (ref 30.0–36.0)
MCV: 93.5 fL (ref 80.0–100.0)
Platelets: 217 10*3/uL (ref 150–400)
RBC: 2.94 MIL/uL — ABNORMAL LOW (ref 3.87–5.11)
RDW: 15.7 % — ABNORMAL HIGH (ref 11.5–15.5)
WBC: 4.6 10*3/uL (ref 4.0–10.5)
nRBC: 0 % (ref 0.0–0.2)

## 2019-03-03 LAB — PROTIME-INR
INR: 2 — ABNORMAL HIGH (ref 0.8–1.2)
Prothrombin Time: 22.9 seconds — ABNORMAL HIGH (ref 11.4–15.2)

## 2019-03-03 LAB — BASIC METABOLIC PANEL
Anion gap: 8 (ref 5–15)
BUN: 40 mg/dL — ABNORMAL HIGH (ref 8–23)
CO2: 23 mmol/L (ref 22–32)
Calcium: 9.1 mg/dL (ref 8.9–10.3)
Chloride: 108 mmol/L (ref 98–111)
Creatinine, Ser: 1.23 mg/dL — ABNORMAL HIGH (ref 0.44–1.00)
GFR calc Af Amer: 44 mL/min — ABNORMAL LOW (ref >60)
GFR calc non Af Amer: 38 mL/min — ABNORMAL LOW (ref >60)
Glucose, Bld: 95 mg/dL (ref 70–99)
Potassium: 4.3 mmol/L (ref 3.5–5.1)
Sodium: 139 mmol/L (ref 135–145)

## 2019-03-03 MED ORDER — LIDOCAINE VISCOUS HCL 2 % MT SOLN
OROMUCOSAL | Status: AC
  Filled 2019-03-03: qty 15

## 2019-03-03 MED ORDER — IOHEXOL 300 MG/ML  SOLN: 50.0000 mL | Freq: Once | INTRAMUSCULAR | Status: DC | PRN

## 2019-03-03 NOTE — Progress Notes (Signed)
Discharge instructions given to pt and daughter and all questions were answered.

## 2019-03-03 NOTE — Discharge Summary (Signed)
Physician Discharge Summary  [name] UUV:[id number] DOB: 1924-03-31 DOA: 03/01/2019  PCP: Sharon Seller, NP  Admit date: 03/01/2019 Discharge date: 03/03/2019  Admitted From: Home Disposition: Home  Recommendations for Outpatient Follow-up:  1. Follow up with PCP in 1-2 weeks   Home Health: No Equipment/Devices: J-tube replaced  Discharge Condition: Stable CODE STATUS: Full code Diet recommendation: Soft, consistent carbohydrate low-sodium diet, tube feeds nocturnally for additional supplementation  History of present illness:  Shannon [name] is a 85 year old female with past medical history remarkable for essential hypertension, CKD stage III, COPD/asthma, depression, hypothyroidism, paroxysmal atrial fibrillation, pyloric stenosis s/p J-tube, type 2 diabetes mellitus who presents from home with dislodgment of her J-tube.  Patient noted during the night leaking from her J-tube site and when she coughed the tube fell out.  She denies any significant discomfort.  Her daughter called 911 and she was brought to the ER for further evaluation.  This is apparently her second time her tube has dislodged.  In the ER a temporary 22 French Foley was placed by the ER physician. The Hospitalist have been asked to obs the patient so IR can replace the tube  Patient lives at home with her daughter, she uses a walker at baseline.  Hospital course:  Hx pyloric stenosis s/p J-tube Moderate protein calorie malnutrition J-tube dislodgment Patient presenting after dislodgment of her J-tube.  Patient utilizes this nocturnally for supplements. Temporary Foley catheter placed by ED physician on arrival.  IR was consulted and underwent successful fluoroscopic guided replacement of a 22 French GJ tube on 03/03/2019.  May resume tube feeds immediately.  Essential hypertension: BP 114/67, well controlled.Continue amlodipine 5 mg p.o. daily, abide mononitrate 60 mg p.o. daily, hydralazine 50  g p.o. TID. Lasix 20mg  PO daily  CKD stage IIIb Creatinine baseline past 12 months 1.3-1.5. GFR 33-40. Creatinine 1.43 on admission, at baseline  COPD/asthma Currently oxygenating well on room air.  Continue home Dulera.  Depression: Continue sertraline 50 mg p.o. daily  GERD: Continue Protonix 40 mg p.o. twice daily  Hypothyroidism: Continue levothyroxine 125 mcg p.o. daily  Paroxysmal atrial fibrillation On Coumadin for chronic anticoagulation, initially held during the hospitalization pending IR placement of GJ tube.  May resume Coumadin on discharge.   Type 2 diabetes mellitus Continue home Lantus 8 units subcutaneous daily and insulin sliding scale.  Discharge Diagnoses:  Principal Problem:   Jejunostomy tube fell out Active Problems:   Essential hypertension, benign   Dyslipidemia   Persistent atrial fibrillation   Diabetic neuropathy (HCC)   Hypothyroidism   Depression   Chronic kidney insufficiency, stage 3 (moderate)   Gastroparesis   Cerebrovascular disease   Malnutrition of moderate degree   Dysphagia   PVD (peripheral vascular disease) (HCC)   Pressure ulcer   Pyloric stenosis   Warfarin anticoagulation   CHF (NYHA class IV, ACC/AHA stage D) (HCC)   COPD with asthma (HCC)   PAD (peripheral artery disease) (HCC)    Discharge Instructions  Discharge Instructions    Call MD for:  difficulty breathing, headache or visual disturbances   Complete by: As directed    Call MD for:  extreme fatigue   Complete by: As directed    Call MD for:  persistant dizziness or light-headedness   Complete by: As directed    Call MD for:  persistant nausea and vomiting   Complete by: As directed    Call MD for:  severe uncontrolled pain   Complete by: As directed  Call MD for:  temperature >100.4   Complete by: As directed    Diet - low sodium heart healthy   Complete by: As directed    Increase activity slowly   Complete by: As directed      Allergies as  of 03/03/2019      Reactions   Penicillins Itching, Rash, Other (See Comments)   Tolerated amoxicillin, unasyn, zosyn & cephalosporins in the past. Did it involve swelling of the face/tongue/throat, SOB, or low BP? No Did it involve sudden or severe rash/hives, skin peeling, or any reaction on the inside of your mouth or nose? No Did you need to seek medical attention at a hospital or doctor's office? Unknown When did it last happen?5+ years If all above answers are "NO", may proceed with cephalosporin use.      Medication List    TAKE these medications   acetaminophen 500 MG tablet Commonly known as: TYLENOL Take 500 mg by mouth every 6 (six) hours as needed (pain).   amLODipine 5 MG tablet Commonly known as: NORVASC Take 1 tablet (5 mg total) by mouth daily.   ARTIFICIAL TEARS OP Place 1 drop into both eyes 4 (four) times daily.   atorvastatin 10 MG tablet Commonly known as: LIPITOR Take 1 tablet (10 mg total) by mouth daily.   b complex vitamins tablet Take 1 tablet by mouth daily.   calcium-vitamin D 500-200 MG-UNIT tablet Commonly known as: OSCAL WITH D Take 1 tablet by mouth daily with breakfast.   collagenase ointment Commonly known as: SANTYL Apply 1 application topically daily as needed (wound care).   cycloSPORINE 0.05 % ophthalmic emulsion Commonly known as: Restasis USE 1 DROP INTO BOTH EYES TWICE DAILY   Dulera 200-5 MCG/ACT Aero Generic drug: mometasone-formoterol TAKE TWO PUFFS BY MOUTH TWICE DAILY   feeding supplement (PRO-STAT SUGAR FREE 64) Liqd Take 30 mLs by mouth 2 (two) times daily.   feeding supplement Liqd Take 711 mLs by mouth See admin instructions. For 12 hours 2100 - 0900   ferrous sulfate 325 (65 FE) MG tablet TAKE 1 TABLET BY MOUTH EVERY DAY   fluticasone 50 MCG/ACT nasal spray Commonly known as: FLONASE SPRAY 2 SPRAYS INTO EACH NOSTRIL EVERY DAY What changed: See the new instructions.   free water Soln Place 150 mLs  into feeding tube 3 (three) times daily.   furosemide 40 MG tablet Commonly known as: LASIX Alternate taking 40 mg and 20 mg every other day   gabapentin 300 MG capsule Commonly known as: NEURONTIN TAKE 1 CAPSULE BY MOUTH TWICE A DAY   gentamicin cream 0.1 % Commonly known as: GARAMYCIN Apply 1 application topically 3 (three) times daily as needed (dry skin).   hydrALAZINE 50 MG tablet Commonly known as: APRESOLINE TAKE 1 TABLET BY MOUTH THREE TIMES A DAY   insulin aspart 100 UNIT/ML FlexPen Commonly known as: NOVOLOG Inject 0-6 Units into the skin 3 (three) times daily with meals. New sliding Scale If BS is 151 - 251 = 2 units,  If BS is 251 -  350 = 4 units,  If BS is 351 - 450 = 6 units Call provider if BS runs more than 400   Insulin Pen Needle 31G X 5 MM Misc Commonly known as: B-D UF III MINI PEN NEEDLES Use twice daily with giving insulin injections. Dx E11.9   ipratropium-albuterol 0.5-2.5 (3) MG/3ML Soln Commonly known as: DUONEB Take 3 mLs by nebulization every 6 (six) hours as needed (sob). Use  3 times daily x 4 days then every 6 hours as needed.   isosorbide mononitrate 30 MG 24 hr tablet Commonly known as: IMDUR TAKE 2 TABLETS BY MOUTH DAILY. PLEASE MAKE ANNUAL APPT WITH DR. Delton See FOR FUTURE REFILLS. THANK YOU. 1ST ATTEMPT. What changed: See the new instructions.   Lantus SoloStar 100 UNIT/ML Solostar Pen Generic drug: Insulin Glargine Inject 8 Units into the skin at bedtime.   latanoprost 0.005 % ophthalmic solution Commonly known as: XALATAN Place 1 drop into both eyes at bedtime.   levalbuterol 45 MCG/ACT inhaler Commonly known as: Xopenex HFA Inhale 2 puffs into the lungs every 6 (six) hours as needed for wheezing or shortness of breath.   levothyroxine 125 MCG tablet Commonly known as: SYNTHROID Take 1 tablet (125 mcg total) by mouth daily.   loratadine 10 MG tablet Commonly known as: CLARITIN Take 10 mg by mouth daily as needed for allergies.    multivitamin with minerals Tabs tablet Take 1 tablet by mouth daily.   nystatin powder Commonly known as: MYCOSTATIN/NYSTOP Apply 1 g topically as needed (skin irritation).   oxyCODONE 5 MG immediate release tablet Commonly known as: Roxicodone Take 1 tablet (5 mg total) by mouth every 4 (four) hours as needed for severe pain. May take 1/2 tab q4hrs as needed for mild pain.   OXYGEN Inhale 2 L into the lungs at bedtime.   pantoprazole 40 MG tablet Commonly known as: PROTONIX TAKE 1 TABLET BY MOUTH TWICE A DAY   sertraline 50 MG tablet Commonly known as: ZOLOFT Take 1 tablet (50 mg total) by mouth daily.   Systane Nighttime Oint Place 1 application into both eyes at bedtime.   warfarin 2.5 MG tablet Commonly known as: COUMADIN Take as directed. If you are unsure how to take this medication, talk to your nurse or doctor. Original instructions: TAKE AS DIRECTED BY COUMADIN CLINIC. What changed: See the new instructions.      Follow-up Information    Sharon Seller, NP. Schedule an appointment as soon as possible for a visit in 1 week(s).   Specialty: Geriatric Medicine Contact information: [address] [place] [address] [phone]        Lars Masson, MD .   Specialty: Cardiology Contact information: [address] [place] [address] [phone]          Allergies  Allergen Reactions  . Penicillins Itching, Rash and Other (See Comments)    Tolerated amoxicillin, unasyn, zosyn & cephalosporins in the past. Did it involve swelling of the face/tongue/throat, SOB, or low BP? No Did it involve sudden or severe rash/hives, skin peeling, or any reaction on the inside of your mouth or nose? No Did you need to seek medical attention at a hospital or doctor's office? Unknown When did it last happen?5+ years If all above answers are "NO", may proceed with cephalosporin use.     Consultations:  Interventional  radiology   Procedures/Studies: Ir Gj Tube Change  Result Date: 03/03/2019 INDICATION: Inadvertent removal of chronic feeding gastrojejunostomy tube. Gastrostomy track was maintained with placement of a Foley catheter at the patient's bedside. Patient now presents for fluoroscopic guided replacement of chronic feeding GJ tube. EXAM: FLUOROSCOPIC GUIDED REPLACEMENT OF GASTROJEJUNOSTOMY TUBE COMPARISON:  Gastrostomy tube injection-09/18/2018; CT abdomen and pelvis-12/25/2018 MEDICATIONS: None. CONTRAST:  15 cc Isovue-300-administered into the gastric lumen and proximal small bowel. FLUOROSCOPY TIME:  4 minutes (49.1 mGy) COMPLICATIONS: None. PROCEDURE: Informed written consent was obtained from the  patient after a discussion of the risks and benefits. The upper abdomen and the external portion of the existing Foley catheter placed bedside to maintain patency of the chronic gastrostomy track was prepped and draped in the usual sterile fashion, and a sterile drape was applied covering the operative field. Maximum barrier sterile technique with sterile gowns and gloves were used for the procedure. A timeout was performed prior to the initiation of the procedure. Existing catheter was injected with a small amount of contrast demonstrated appropriate positioning within the gastric lumen. The external portion of the existing catheter was cut and the track was cannulated with a Kumpe catheter. With the use of a stiff Glidewire, the Kumpe catheter was manipulated into the proximal small bowel. Contrast injection confirmed appropriate positioning Next, under intermittent fluoroscopic guidance, the Kumpe catheter was exchanged for a new 22 French, 45 cm balloon retention gastrojejunostomy catheter. The retention balloon was insufflated with approximately 10 cc of saline and dilute contrast and pulled against the anterior abdominal wall. The external disc was cinched. Both lumens were injected with a small amount contrast  demonstrated appropriate position functionality. Both lumens were flushed with a small amount of saline. Dressings were applied. The patient tolerated the procedure well without immediate postprocedural complication. IMPRESSION: Successful fluoroscopic guided replacement of new 22 French, 45 cm balloon retention gastrojejunostomy catheter with the tip of the jejunostomy lumen terminating within the proximal jejunum. Both lumens ready for immediate use. Electronically Signed   By: Simonne Come M.D.   On: 03/03/2019 17:11      Subjective: Patient seen and examined at bedside, resting comfortably.  No specific complaints this morning.  Underwent successful IR replacement of GJ tube this afternoon.  Ready for discharge home.  Denies headache, no chest pain, no palpitations, no shortness of breath, no nausea/vomiting/diarrhea, no abdominal pain, no weakness, no fatigue, no paresthesias.  No acute events overnight per nursing staff.   Discharge Exam: Vitals:   03/03/19 0832 03/03/19 1318  BP:  (!) 187/74  Pulse:  68  Resp:    Temp:  99.1 F (37.3 C)  SpO2: 98% 100%   Vitals:   03/02/19 2116 03/03/19 0558 03/03/19 0832 03/03/19 1318  BP: (!) 142/57 (!) 141/71  (!) 187/74  Pulse: 71 66  68  Resp: 18 16    Temp: 98.8 F (37.1 C) 98 F (36.7 C)  99.1 F (37.3 C)  TempSrc: Oral Oral  Oral  SpO2: 97% 99% 98% 100%  Weight:      Height:        General exam: Appears calm and comfortable, elderly in appearance Respiratory system: Clear to auscultation. Respiratory effort normal. Cardiovascular system: S1 & S2 heard, RRR. No JVD, murmurs, rubs, gallops or clicks. No pedal edema. Gastrointestinal system: Abdomen is nondistended, soft and nontender. No organomegaly or masses felt. Normal bowel sounds heard.    GJ tube in place. Central nervous system: Alert and oriented. No focal neurological deficits. Extremities: Symmetric 5 x 5 power. Skin: No rashes, lesions or ulcers Psychiatry: Judgement  and insight appear normal. Mood & affect appropriate.     The results of significant diagnostics from this hospitalization (including imaging, microbiology, ancillary and laboratory) are listed below for reference.     Microbiology: Recent Results (from the past 240 hour(s))  SARS CORONAVIRUS 2 (TAT 6-24 HRS) Nasopharyngeal Nasopharyngeal Swab     Status: None   Collection Time: 03/02/19  1:01 AM   Specimen: Nasopharyngeal Swab  Result Value Ref Range  Status   SARS Coronavirus 2 NEGATIVE NEGATIVE Final    Comment: (NOTE) SARS-CoV-2 target nucleic acids are NOT DETECTED. The SARS-CoV-2 RNA is generally detectable in upper and lower respiratory specimens during the acute phase of infection. Negative results do not preclude SARS-CoV-2 infection, do not rule out co-infections with other pathogens, and should not be used as the sole basis for treatment or other patient management decisions. Negative results must be combined with clinical observations, patient history, and epidemiological information. The expected result is Negative. Fact Sheet for Patients: HairSlick.no Fact Sheet for Healthcare Providers: quierodirigir.com This test is not yet approved or cleared by the [place] FDA and  has been authorized for detection and/or diagnosis of SARS-CoV-2 by FDA under an Emergency Use Authorization (EUA). This EUA will remain  in effect (meaning this test can be used) for the duration of the COVID-19 declaration under Section 56 4(b)(1) of the Act, 21 U.S.C. section 360bbb-3(b)(1), unless the authorization is terminated or revoked sooner. Performed at [facility], [address], [place], [place] [address]      Labs: BNP (last 3 results) Recent Labs    11/25/18 0530  BNP 233.9*   Basic Metabolic Panel: Recent Labs  Lab 03/01/19 2145 03/02/19 0242 03/02/19 0731 03/03/19 0355  NA 136  --  136 139  K 4.4  --   4.1 4.3  CL 102  --  105 108  CO2 25  --  24 23  GLUCOSE 91  --  93 95  BUN 60*  --  52* 40*  CREATININE 1.46* 1.43* 1.39* 1.23*  CALCIUM 9.5  --  9.1 9.1   Liver Function Tests: No results for input(s): AST, ALT, ALKPHOS, BILITOT, PROT, ALBUMIN in the last 168 hours. No results for input(s): LIPASE, AMYLASE in the last 168 hours. No results for input(s): AMMONIA in the last 168 hours. CBC: Recent Labs  Lab 03/01/19 2145 03/02/19 0242 03/02/19 0731 03/03/19 0355  WBC 6.0 5.2 6.3 4.6  NEUTROABS 3.6  --   --   --   HGB 9.4* 9.2* 9.8* 9.0*  HCT 29.1* 28.6* 30.0* 27.5*  MCV 93.9 93.8 94.9 93.5  PLT 228 214 224 217   Cardiac Enzymes: No results for input(s): CKTOTAL, CKMB, CKMBINDEX, TROPONINI in the last 168 hours. BNP: Invalid input(s): POCBNP CBG: Recent Labs  Lab 03/03/19 0012 03/03/19 0400 03/03/19 0751 03/03/19 1130 03/03/19 1535  GLUCAP 106* 97 92 79 71   D-Dimer No results for input(s): DDIMER in the last 72 hours. Hgb A1c Recent Labs    03/02/19 0242  HGBA1C 6.2*   Lipid Profile No results for input(s): CHOL, HDL, LDLCALC, TRIG, CHOLHDL, LDLDIRECT in the last 72 hours. Thyroid function studies No results for input(s): TSH, T4TOTAL, T3FREE, THYROIDAB in the last 72 hours.  Invalid input(s): FREET3 Anemia work up No results for input(s): VITAMINB12, FOLATE, FERRITIN, TIBC, IRON, RETICCTPCT in the last 72 hours. Urinalysis    Component Value Date/Time   COLORURINE YELLOW 11/24/2018 1952   APPEARANCEUR HAZY (A) 11/24/2018 1952   LABSPEC 1.008 11/24/2018 1952   PHURINE 7.0 11/24/2018 1952   GLUCOSEU NEGATIVE 11/24/2018 1952   HGBUR NEGATIVE 11/24/2018 1952   BILIRUBINUR NEGATIVE 11/24/2018 1952   BILIRUBINUR Neg 11/01/2017 1228   KETONESUR NEGATIVE 11/24/2018 1952   PROTEINUR NEGATIVE 11/24/2018 1952   UROBILINOGEN 0.2 11/01/2017 1228   UROBILINOGEN 0.2 01/27/2015 2308   NITRITE NEGATIVE 11/24/2018 1952   LEUKOCYTESUR NEGATIVE 11/24/2018 1952    Sepsis  Labs Invalid input(s): PROCALCITONIN,  WBC,  LACTICIDVEN Microbiology Recent Results (from the past 240 hour(s))  SARS CORONAVIRUS 2 (TAT 6-24 HRS) Nasopharyngeal Nasopharyngeal Swab     Status: None   Collection Time: 03/02/19  1:01 AM   Specimen: Nasopharyngeal Swab  Result Value Ref Range Status   SARS Coronavirus 2 NEGATIVE NEGATIVE Final    Comment: (NOTE) SARS-CoV-2 target nucleic acids are NOT DETECTED. The SARS-CoV-2 RNA is generally detectable in upper and lower respiratory specimens during the acute phase of infection. Negative results do not preclude SARS-CoV-2 infection, do not rule out co-infections with other pathogens, and should not be used as the sole basis for treatment or other patient management decisions. Negative results must be combined with clinical observations, patient history, and epidemiological information. The expected result is Negative. Fact Sheet for Patients: HairSlick.no Fact Sheet for Healthcare Providers: quierodirigir.com This test is not yet approved or cleared by the [place] FDA and  has been authorized for detection and/or diagnosis of SARS-CoV-2 by FDA under an Emergency Use Authorization (EUA). This EUA will remain  in effect (meaning this test can be used) for the duration of the COVID-19 declaration under Section 56 4(b)(1) of the Act, 21 U.S.C. section 360bbb-3(b)(1), unless the authorization is terminated or revoked sooner. Performed at [facility], [address] [place], [place] [address]      Time coordinating discharge: Over 30 minutes  SIGNED:   Alvira Philips [place], DO  [facility] 03/03/2019, 5:28 PM

## 2019-03-03 NOTE — Procedures (Signed)
Pre procedural Dx: Accidentally removed chronic GJ tube Post procedural Dx: Same  Successful fluoroscopic guided replacement of 22 Fr GJ tube.   The feeding tube is ready for immediate use.  EBL: None Complications: None immediate.  Ronny Bacon, MD Pager #: [phone]

## 2019-03-03 NOTE — Progress Notes (Signed)
PROGRESS NOTE    Shannon [name]  ZOX:[id number] DOB: 1923-06-15 DOA: 03/01/2019 PCP: Sharon Seller, NP    Brief Narrative:   Shannon [name] is a 85 year old female  with past medical history remarkable for essential hypertension, CKD stage III, COPD/asthma, depression, hypothyroidism, paroxysmal atrial fibrillation, pyloric stenosis s/p J-tube, type 2 diabetes mellitus who presents from home with dislodgment of her J-tube.  Patient noted during the night leaking from her J-tube site and when she coughed the tube fell out.  She denies any significant discomfort.  Her daughter called 911 and she was brought to the ER for further evaluation.  This is apparently her second time her tube has dislodged.  In the ER a temporary 22 French Foley was placed by the ER physician. The Hospitalist have been asked to obs the patient so IR can replace the tube  Patient lives at home with her daughter, she uses a walker at baseline.   Assessment & Plan:   Principal Problem:   Jejunostomy tube fell out Active Problems:   Essential hypertension, benign   Dyslipidemia   Persistent atrial fibrillation   Diabetic neuropathy (HCC)   Hypothyroidism   Depression   Chronic kidney insufficiency, stage 3 (moderate)   Gastroparesis   Cerebrovascular disease   Malnutrition of moderate degree   Dysphagia   PVD (peripheral vascular disease) (HCC)   Pressure ulcer   Pyloric stenosis   Warfarin anticoagulation   CHF (NYHA class IV, ACC/AHA stage D) (HCC)   COPD with asthma (HCC)   PAD (peripheral artery disease) (HCC)   Hx pyloric stenosis s/p J-tube Moderate protein calorie malnutrition J-tube dislodgment Patient presenting after dislodgment of her J-tube.  Patient utilizes this nocturnally for supplements.  Temporary Foley catheter placed by ED physician on arrival. --IR consulted for replacement, plan replacement this afternoon  Essential hypertension: BP 114/67, well controlled. --Continue  amlodipine 5 mg p.o. daily, abide mononitrate 60 mg p.o. daily, hydralazine 50 g p.o. TID --Lasix 20mg  PO daily --Clonidine 0.1 mg q4h prn SBP >160  CKD stage IIIb Creatinine baseline past 12 months 1.3-1.5. GFR 33-40. --Creatinine 1.43 on admission, at baseline --Avoid nephrotoxins, renally dose all medications --BMP daily  COPD/asthma Currently oxygenating well on room air. --DuoNebs as needed  Depression: Continue sertraline 50 mg p.o. daily  GERD: Continue Protonix 40 mg p.o. twice daily  Hypothyroidism: Continue levothyroxine 125 mcg p.o. daily  Paroxysmal atrial fibrillation --On Coumadin for chronic anticoagulation, currently on hold pending IR replacement of J-tube --INR 2.1-->2.0 --resume anticoagulation following J-tube replacement  Type 2 diabetes mellitus On Lantus 5 units subcutaneously nightly and insulin sliding scale at home. --Continue Lantus 5 units [place] qHS --ISS for further coverage    DVT prophylaxis: SCDs Code Status: Full code Family Communication: Updated patient's daughter, Shannon [name] via telephone this morning, likely discharge home later this afternoon/evening once J-tube has been replaced. Disposition Plan: Continue observation status, IR plans for replacement of J-tube today, then plan discharge home   Consultants:   Interventional radiology  Procedures:   None  Antimicrobials:   None   Subjective: Patient seen and examined at bedside, resting comfortably.  No specific complaints this morning.  IR plans for placement of J-tube later this afternoon.  Updated patient's daughter via telephone.  Denies headache, no chest pain, no palpitations, no shortness of breath, no nausea/vomiting/diarrhea, no abdominal pain, no weakness, no fatigue, no paresthesias.  No acute events overnight per nursing staff.  Objective: Vitals:   03/02/19 2009 03/02/19  2116 03/03/19 0558 03/03/19 0832  BP:  (!) 142/57 (!) 141/71   Pulse:  71 66   Resp:  18 16    Temp:  98.8 F (37.1 C) 98 F (36.7 C)   TempSrc:  Oral Oral   SpO2: 96% 97% 99% 98%  Weight:      Height:        Intake/Output Summary (Last 24 hours) at 03/03/2019 1126 Last data filed at 03/03/2019 0941 Gross per 24 hour  Intake 1500 ml  Output 1750 ml  Net -250 ml   Filed Weights   03/01/19 2100 03/02/19 0340  Weight: 63.5 kg 65.5 kg    Examination:  General exam: Appears calm and comfortable, elderly in appearance Respiratory system: Clear to auscultation. Respiratory effort normal. Cardiovascular system: S1 & S2 heard, RRR. No JVD, murmurs, rubs, gallops or clicks. No pedal edema. Gastrointestinal system: Abdomen is nondistended, soft and nontender. No organomegaly or masses felt. Normal bowel sounds heard.  Noted temporary Foley catheter in place. Central nervous system: Alert and oriented. No focal neurological deficits. Extremities: Symmetric 5 x 5 power. Skin: No rashes, lesions or ulcers Psychiatry: Judgement and insight appear normal. Mood & affect appropriate.     Data Reviewed: I have personally reviewed following labs and imaging studies  CBC: Recent Labs  Lab 03/01/19 2145 03/02/19 0242 03/02/19 0731 03/03/19 0355  WBC 6.0 5.2 6.3 4.6  NEUTROABS 3.6  --   --   --   HGB 9.4* 9.2* 9.8* 9.0*  HCT 29.1* 28.6* 30.0* 27.5*  MCV 93.9 93.8 94.9 93.5  PLT 228 214 224 217   Basic Metabolic Panel: Recent Labs  Lab 03/01/19 2145 03/02/19 0242 03/02/19 0731 03/03/19 0355  NA 136  --  136 139  K 4.4  --  4.1 4.3  CL 102  --  105 108  CO2 25  --  24 23  GLUCOSE 91  --  93 95  BUN 60*  --  52* 40*  CREATININE 1.46* 1.43* 1.39* 1.23*  CALCIUM 9.5  --  9.1 9.1   GFR: Estimated Creatinine Clearance: 26.8 mL/min (A) (by C-G formula based on SCr of 1.23 mg/dL (H)). Liver Function Tests: No results for input(s): AST, ALT, ALKPHOS, BILITOT, PROT, ALBUMIN in the last 168 hours. No results for input(s): LIPASE, AMYLASE in the last 168 hours. No results  for input(s): AMMONIA in the last 168 hours. Coagulation Profile: Recent Labs  Lab 03/01/19 2145 03/02/19 0731 03/03/19 0355  INR 2.1* 1.9* 2.0*   Cardiac Enzymes: No results for input(s): CKTOTAL, CKMB, CKMBINDEX, TROPONINI in the last 168 hours. BNP (last 3 results) No results for input(s): PROBNP in the last 8760 hours. HbA1C: Recent Labs    03/02/19 0242  HGBA1C 6.2*   CBG: Recent Labs  Lab 03/02/19 1629 03/02/19 2037 03/03/19 0012 03/03/19 0400 03/03/19 0751  GLUCAP 74 118* 106* 97 92   Lipid Profile: No results for input(s): CHOL, HDL, LDLCALC, TRIG, CHOLHDL, LDLDIRECT in the last 72 hours. Thyroid Function Tests: No results for input(s): TSH, T4TOTAL, FREET4, T3FREE, THYROIDAB in the last 72 hours. Anemia Panel: No results for input(s): VITAMINB12, FOLATE, FERRITIN, TIBC, IRON, RETICCTPCT in the last 72 hours. Sepsis Labs: No results for input(s): PROCALCITON, LATICACIDVEN in the last 168 hours.  Recent Results (from the past 240 hour(s))  SARS CORONAVIRUS 2 (TAT 6-24 HRS) Nasopharyngeal Nasopharyngeal Swab     Status: None   Collection Time: 03/02/19  1:01 AM   Specimen: Nasopharyngeal  Swab  Result Value Ref Range Status   SARS Coronavirus 2 NEGATIVE NEGATIVE Final    Comment: (NOTE) SARS-CoV-2 target nucleic acids are NOT DETECTED. The SARS-CoV-2 RNA is generally detectable in upper and lower respiratory specimens during the acute phase of infection. Negative results do not preclude SARS-CoV-2 infection, do not rule out co-infections with other pathogens, and should not be used as the sole basis for treatment or other patient management decisions. Negative results must be combined with clinical observations, patient history, and epidemiological information. The expected result is Negative. Fact Sheet for Patients: HairSlick.no Fact Sheet for Healthcare Providers: quierodirigir.com This test is not  yet approved or cleared by the [place] FDA and  has been authorized for detection and/or diagnosis of SARS-CoV-2 by FDA under an Emergency Use Authorization (EUA). This EUA will remain  in effect (meaning this test can be used) for the duration of the COVID-19 declaration under Section 56 4(b)(1) of the Act, 21 U.S.C. section 360bbb-3(b)(1), unless the authorization is terminated or revoked sooner. Performed at [facility], [address], [place], [place] [address]          Radiology Studies: No results found.      Scheduled Meds: . amLODipine  5 mg Oral Daily  . atorvastatin  10 mg Oral q1800  . fluticasone  2 spray Each Nare Daily  . furosemide  20 mg Oral Daily  . gabapentin  300 mg Oral BID  . hydrALAZINE  50 mg Oral TID  . insulin aspart  0-15 Units Subcutaneous TID WC  . insulin aspart  0-5 Units Subcutaneous QHS  . insulin glargine  5 Units Subcutaneous QHS  . isosorbide mononitrate  60 mg Oral Daily  . levothyroxine  125 mcg Oral Q0600  . mometasone-formoterol  2 puff Inhalation BID  . pantoprazole  40 mg Oral BID  . sertraline  50 mg Oral Daily   Continuous Infusions: . sodium chloride 75 mL/hr at 03/03/19 0836     LOS: 1 day    Time spent: 25 minutes spent on chart review, discussion with nursing staff, consultants, updating family and interview/physical exam; more than 50% of that time was spent in counseling and/or coordination of care.    Alvira Philips [place], DO [facility] 03/03/2019, 11:26 AM

## 2019-03-03 NOTE — Progress Notes (Signed)
Initial Nutrition Assessment  INTERVENTION:   If patient to remain inpatient, Resume home TF regimen once J-tube replaced.  Home TF regimen: Osmolite 1.2 @ 60 ml/hr x 12 hours (2100-0900). 30 ml Prostat BID This regimen provides 1064 kcals, 69g protein and 590 ml H2O. Free water flushes of 150 ml TID (450 ml)  NUTRITION DIAGNOSIS:   Inadequate oral intake related to chronic illness as evidenced by (need for G-J tube feedings).  GOAL:   Patient will meet greater than or equal to 90% of their needs  MONITOR:   PO intake, Labs, Weight trends, TF tolerance, I & O's  REASON FOR ASSESSMENT:   Malnutrition Screening Tool    ASSESSMENT:   85 year old female  with past medical history remarkable for essential hypertension, CKD stage III, COPD/asthma, depression, hypothyroidism, paroxysmal atrial fibrillation, pyloric stenosis s/p J-tube, type 2 diabetes mellitus who presents from home with dislodgment of her J-tube.  **RD working remotely**  Patient with dislodged J-tube. Per MD note, pt to have J-tube replaced today and possible discharged after that. If pt unable to be discharged, provided home TF regimen to start with patient.  Pt currently NPO today for tube replacement. While pt was on a soft diet, she was consuming 75-100% of meals. TF are only meeting ~70% of kcal needs and 100% of protein needs.  Per weight records, pt's weight has remained stable.   Medications: Lasix tablet  Labs reviewed: CBGs: 79-97  NUTRITION - FOCUSED PHYSICAL EXAM:  Working remotely.  Diet Order:   Diet Order            Diet NPO time specified  Diet effective midnight              EDUCATION NEEDS:   No education needs have been identified at this time  Skin:  Skin Assessment: Reviewed RN Assessment  Last BM:  11/27  Height:   Ht Readings from Last 1 Encounters:  03/02/19 5\' 6"  (1.676 m)    Weight:   Wt Readings from Last 1 Encounters:  03/02/19 65.5 kg    Ideal Body  Weight:  59.1 kg  BMI:  Body mass index is 23.31 kg/m.  Estimated Nutritional Needs:   Kcal:  1500-1700  Protein:  65-75g  Fluid:  1.7L/day  Clayton Bibles, MS, RD, LDN Inpatient Clinical Dietitian Pager: [phone] After Hours Pager: [phone]

## 2019-03-06 ENCOUNTER — Other Ambulatory Visit: Payer: Self-pay

## 2019-03-06 ENCOUNTER — Other Ambulatory Visit (HOSPITAL_COMMUNITY): Payer: Self-pay | Admitting: Student

## 2019-03-06 ENCOUNTER — Encounter (HOSPITAL_COMMUNITY): Payer: Self-pay | Admitting: Diagnostic Radiology

## 2019-03-06 ENCOUNTER — Ambulatory Visit (HOSPITAL_COMMUNITY)
Admission: RE | Admit: 2019-03-06 | Discharge: 2019-03-06 | Disposition: A | Discharge status: Home / Self Care | Payer: Medicare Other | Source: Ambulatory Visit | Attending: Student | Admitting: Student

## 2019-03-06 DIAGNOSIS — Z431 Encounter for attention to gastrostomy: Secondary | ICD-10-CM | POA: Insufficient documentation

## 2019-03-06 DIAGNOSIS — R633 Feeding difficulties, unspecified: Secondary | ICD-10-CM

## 2019-03-06 HISTORY — PX: IR CM INJ ANY COLONIC TUBE W/FLUORO: IMG2336

## 2019-03-06 MED ORDER — IOHEXOL 300 MG/ML  SOLN
50.0000 mL | Freq: Once | INTRAMUSCULAR | Status: AC | PRN
  Administered 2019-03-06: 5 mL via ORAL

## 2019-03-07 ENCOUNTER — Ambulatory Visit (INDEPENDENT_AMBULATORY_CARE_PROVIDER_SITE_OTHER): Payer: Medicare Other | Admitting: *Deleted

## 2019-03-07 ENCOUNTER — Other Ambulatory Visit: Payer: Self-pay

## 2019-03-07 ENCOUNTER — Telehealth: Payer: Self-pay | Admitting: *Deleted

## 2019-03-07 ENCOUNTER — Telehealth: Payer: Self-pay

## 2019-03-07 DIAGNOSIS — Z5181 Encounter for therapeutic drug level monitoring: Secondary | ICD-10-CM | POA: Diagnosis not present

## 2019-03-07 DIAGNOSIS — I4891 Unspecified atrial fibrillation: Secondary | ICD-10-CM | POA: Diagnosis not present

## 2019-03-07 DIAGNOSIS — I4892 Unspecified atrial flutter: Secondary | ICD-10-CM

## 2019-03-07 DIAGNOSIS — I10 Essential (primary) hypertension: Secondary | ICD-10-CM

## 2019-03-07 DIAGNOSIS — Z79899 Other long term (current) drug therapy: Secondary | ICD-10-CM

## 2019-03-07 LAB — POCT INR: INR: 1.7 — AB (ref 2.0–3.0)

## 2019-03-07 NOTE — Telephone Encounter (Signed)
Transition Care Management Follow-up Telephone Call  Date of discharge and from where: 03/03/2019 [place]  How have you been since you were released from the hospital? good  Any questions or concerns? No   Items Reviewed:  Did the pt receive and understand the discharge instructions provided? Yes   Medications obtained and verified? Yes   Any new allergies since your discharge? No   Dietary orders reviewed? Yes  Do you have support at home? Yes   Other (ie: DME, Home Health, etc) No Home Health  Functional Questionnaire: (I = Independent and D = Dependent) ADL's: I with assistance  Bathing/Dressing- I with assistance   Meal Prep- D  Eating- I with assistance   Maintaining continence- I  Transferring/Ambulation- I with assistance  Managing Meds- D   Follow up appointments reviewed:    PCP Hospital f/u appt confirmed? Yes  Scheduled to see Dinah on 03/13/2019 @ 11.  [facility] f/u appt confirmed? No    Are transportation arrangements needed? No   If their condition worsens, is the pt aware to call  their PCP or go to the ED? Yes  Was the patient provided with contact information for the PCP's office or ED? Yes  Was the pt encouraged to call back with questions or concerns? Yes

## 2019-03-07 NOTE — Patient Instructions (Signed)
Description   Pt's daughter present. Today take 3 tablets then continue taking 2 tablets daily except 3 tablets on Sundays. Recheck in 4 weeks. Call Coumadin Clinic with any new medications [phone].

## 2019-03-07 NOTE — Progress Notes (Signed)
The writer confirmed home visit on 03/06/19 w/Shannon [name], Shannon [name], for Friday 03/07/19 via telephone call. In the am of 03/07/19, Shannon [name] called to cancel the home visit.  She stated Shannon [name] was to be seen in the coumadin clinic to have her INR drawn and would rather her have all her labs drawn there.  It was offered to her to have all Shannon [name]'s labs drawn at the home visit so she wouldn't have to leave home, but she said she didn't really want anyone coming to the house.   [facility] called to have BMET included w/ the INR order to be drawn at the same time.  Ordering provider made aware as well.

## 2019-03-07 NOTE — Telephone Encounter (Signed)
Spoke with [facility] nurse patient family does not want anyone in home to see patient but she is going to bring patient to [facility] for INR and patient needs BMET while there.  Order placed and appt already there for BMEt while at office getting INR.

## 2019-03-10 ENCOUNTER — Other Ambulatory Visit: Payer: Self-pay

## 2019-03-10 ENCOUNTER — Ambulatory Visit (INDEPENDENT_AMBULATORY_CARE_PROVIDER_SITE_OTHER): Payer: Medicare Other | Admitting: Podiatry

## 2019-03-10 ENCOUNTER — Encounter: Payer: Self-pay | Admitting: Podiatry

## 2019-03-10 DIAGNOSIS — E0859 Diabetes mellitus due to underlying condition with other circulatory complications: Secondary | ICD-10-CM

## 2019-03-10 DIAGNOSIS — L97512 Non-pressure chronic ulcer of other part of right foot with fat layer exposed: Secondary | ICD-10-CM

## 2019-03-11 ENCOUNTER — Ambulatory Visit (HOSPITAL_COMMUNITY)
Admission: RE | Admit: 2019-03-11 | Discharge: 2019-03-11 | Disposition: A | Discharge status: Home / Self Care | Payer: Medicare Other | Source: Ambulatory Visit | Attending: Diagnostic Radiology | Admitting: Diagnostic Radiology

## 2019-03-11 ENCOUNTER — Other Ambulatory Visit (HOSPITAL_COMMUNITY): Payer: Self-pay | Admitting: Diagnostic Radiology

## 2019-03-11 ENCOUNTER — Encounter (HOSPITAL_COMMUNITY): Payer: Self-pay | Admitting: Diagnostic Radiology

## 2019-03-11 DIAGNOSIS — Z4659 Encounter for fitting and adjustment of other gastrointestinal appliance and device: Secondary | ICD-10-CM | POA: Insufficient documentation

## 2019-03-11 DIAGNOSIS — Z431 Encounter for attention to gastrostomy: Secondary | ICD-10-CM

## 2019-03-11 HISTORY — PX: IR CM INJ ANY COLONIC TUBE W/FLUORO: IMG2336

## 2019-03-11 MED ORDER — IOHEXOL 300 MG/ML  SOLN
50.0000 mL | Freq: Once | INTRAMUSCULAR | Status: AC | PRN
  Administered 2019-03-11: 20 mL via ORAL

## 2019-03-12 ENCOUNTER — Encounter: Payer: Self-pay | Admitting: Physician Assistant

## 2019-03-12 ENCOUNTER — Other Ambulatory Visit: Payer: Self-pay

## 2019-03-12 ENCOUNTER — Telehealth (INDEPENDENT_AMBULATORY_CARE_PROVIDER_SITE_OTHER): Payer: Medicare Other | Admitting: Physician Assistant

## 2019-03-12 VITALS — BP 166/73 | HR 73 | Wt 123.0 lb

## 2019-03-12 DIAGNOSIS — I480 Paroxysmal atrial fibrillation: Secondary | ICD-10-CM

## 2019-03-12 DIAGNOSIS — I779 Disorder of arteries and arterioles, unspecified: Secondary | ICD-10-CM

## 2019-03-12 DIAGNOSIS — Z8719 Personal history of other diseases of the digestive system: Secondary | ICD-10-CM | POA: Diagnosis not present

## 2019-03-12 DIAGNOSIS — I5032 Chronic diastolic (congestive) heart failure: Secondary | ICD-10-CM

## 2019-03-12 DIAGNOSIS — I10 Essential (primary) hypertension: Secondary | ICD-10-CM

## 2019-03-12 DIAGNOSIS — I110 Hypertensive heart disease with heart failure: Secondary | ICD-10-CM | POA: Diagnosis not present

## 2019-03-12 NOTE — Progress Notes (Signed)
Subjective:  85 y.o. female with PMHx of diabetes mellitus presenting today for follow up evaluation of ulcerations of the right foot. She states she is doing well. She denies any pain or worsening factors. She has been using Betadine and the post op shoe as directed. Patient is here for further evaluation and treatment.    Past Medical History:  Diagnosis Date  . Acute respiratory failure ([place])   . Anemia    previous blood transfusions  . Arthritis    "all over"  . Asthma   . Atrial flutter ([place])   . Bradycardia    requiring previous d/c of BB and reduction of amiodarone  . CAD (coronary artery disease)    nonobstructive per notes  . Chronic diastolic CHF (congestive heart failure) ([place])   . CKD (chronic kidney disease), stage III   . Complication of blood transfusion    "got the wrong blood type at [facility] in ~ 2015; no adverse reaction that we are aware of"/daughter, [name] (01/27/2016)  . COPD (chronic obstructive pulmonary disease) ([place])   . Depression    "light case"  . DVT (deep venous thrombosis) ([place]) 01/2016   a. LLE DVT 01/2016 - switched from Eliquis to Coumadin.  [name] Dyspnea    with some activity  . Gastric stenosis    a. s/p stomach tube  . GERD (gastroesophageal reflux disease)   . History of blood transfusion    "several" (01/27/2016)  . History of stomach ulcers   . Hyperlipidemia   . Hypertension   . Hypothyroidism   . On home oxygen therapy    "2L; 9PM til 9AM" (06/27/2017)  . PAD (peripheral artery disease) ([place])    a. prior gangrene, toe amputations, intervention.  [name] PAF (paroxysmal atrial fibrillation) ([place])   . Paraesophageal hernia   . Perforated gastric ulcer ([place])   . Pneumonia    "a few times" (06/27/2017)  . Seasonal allergies   . SIADH (syndrome of inappropriate ADH production) ([place])    [name] 01/10/2015  . Small bowel obstruction ([place])    "I don't know how many" (01/11/2015)  . Stroke [facility])    several- no residual  . Type II  diabetes mellitus ([place])    "related to prednisone use  for > 20 yr; once predinose stopped; no more DM RX" (01/27/2016)  . UTI (urinary tract infection) 02/08/2016  . Ventral hernia with bowel obstruction       Objective/Physical Exam General: The patient is alert and oriented x3 in no acute distress.  Dermatology:  Wounds noted to 2nd and 3rd digits of the right foot, both measuring 0.9 x 0.9 x 0.1 cm (LxWxD).   To the noted ulceration(s), there is no eschar. There is a moderate amount of slough, fibrin, and necrotic tissue noted. Granulation tissue and wound base is red. There is a minimal amount of serosanguineous drainage noted. There is no exposed bone muscle-tendon ligament or joint. There is no malodor. Periwound integrity is intact. Skin is warm, dry and supple bilateral lower extremities.  Vascular: Palpable pedal pulses bilaterally. No edema or erythema noted. Capillary refill within normal limits.  Neurological: Epicritic and protective threshold diminished bilaterally.   Musculoskeletal Exam: Range of motion within normal limits to all pedal and ankle joints bilateral. Muscle strength 5/5 in all groups bilateral.   Assessment: 1. Ulcerations noted to digits 2 and 3 secondary to diabetes mellitus 2. diabetes mellitus w/ peripheral neuropathy   Plan of Care:  1. Patient was  evaluated. 2. medically necessary excisional debridement including subcutaneous tissue was performed using a tissue nipper and a chisel blade. Excisional debridement of all the necrotic nonviable tissue down to healthy bleeding viable tissue was performed with post-debridement measurements same as pre-. 3. the wounds were cleansed and dry sterile dressing applied. 4. Continue using Betadine daily. 5. Continue using post op shoe.  6. Return to clinic in 6 weeks.   Edrick Kins, DPM [facility]  Dr. Edrick Kins, [address]                                         [place], [place] [address]                Office [phone]  Fax [phone]

## 2019-03-12 NOTE — Progress Notes (Signed)
Virtual Visit via Telephone Note   This visit type was conducted due to national recommendations for restrictions regarding the COVID-19 Pandemic (e.g. social distancing) in an effort to limit this patient's exposure and mitigate transmission in our community.  Due to her co-morbid illnesses, this patient is at least at moderate risk for complications without adequate follow up.  This format is felt to be most appropriate for this patient at this time.  The patient did not have access to video technology/had technical difficulties with video requiring transitioning to audio format only (telephone).  All issues noted in this document were discussed and addressed.  No physical exam could be performed with this format.  Please refer to the patient's chart for her  consent to telehealth for [facility].   Date:  03/12/2019   ID:  Shannon [name], DOB 1923/11/29, MRN [id number]  Patient Location: Home Provider Location: Home  PCP:  Sharon Seller, NP  Cardiologist:  Tobias Alexander, MD   Electrophysiologist:  None   Evaluation Performed:  Follow-Up Visit  Chief Complaint:  follow  History of Present Illness:    Shannon [name] is a 85 y.o. female with history of chronic diastolic CHF, paroxysmal atrial fibrillation on Coumadin, gastric outlet obstruction s/p gastrojejunostomy feeding tube , nonobstructive CAD, history of CVA, COPD, CKD stage III, insulin-dependent type 2 diabetes, hypertension, hyperlipidemia, anemia of chronic disease, lower extremity PAD s/p bilateral interventions followed by VVS,hypothyroidism, and depression    Patient admitted with syncope 11/24/2018 possible secondary to bradycardia-coreg stopped. Troponins normal, CT negative, echo normal LVEF. Norvasc added for BP control. Not seen by Cardiology. Readmitted 12/26/18 with BRBPR in the setting of constipation felt to be a hemorrhoidal bleed. Hbg stable 10.1 coumadin continued.   Carotid dopplers were stable, 30 day  monitor without pauses or arrhthymias, . Crt up 1.61 so lasix was reduced 20 mg daily and legs began to sell. LOV 02/19/19 was getting canned soup/broth. I increased lasix to 20mg /40 mg alternating days.  Patient's feed tube dislodged and she had to have it replaced 03/03/19. Crt 1.23 that day Hbg 9.0. Still having trouble with feeding tube leaking-was checked yesterday. BP up today and hasn't taken any meds yet today. Leg swelling down. Weight down 20 lbs but they think her scale needs a new battery and weight wasn't accurate.   The patient does not have symptoms concerning for COVID-19 infection (fever, chills, cough, or new shortness of breath).    Past Medical History:  Diagnosis Date  . Acute respiratory failure (HCC)   . Anemia    previous blood transfusions  . Arthritis    "all over"  . Asthma   . Atrial flutter (HCC)   . Bradycardia    requiring previous d/c of BB and reduction of amiodarone  . CAD (coronary artery disease)    nonobstructive per notes  . Chronic diastolic CHF (congestive heart failure) (HCC)   . CKD (chronic kidney disease), stage III   . Complication of blood transfusion    "got the wrong blood type at [facility] in ~ 2015; no adverse reaction that we are aware of"/daughter, [name] (01/27/2016)  . COPD (chronic obstructive pulmonary disease) (HCC)   . Depression    "light case"  . DVT (deep venous thrombosis) (HCC) 01/2016   a. LLE DVT 01/2016 - switched from Eliquis to Coumadin.  [name] Dyspnea    with some activity  . Gastric stenosis    a. s/p stomach tube  .  GERD (gastroesophageal reflux disease)   . History of blood transfusion    "several" (01/27/2016)  . History of stomach ulcers   . Hyperlipidemia   . Hypertension   . Hypothyroidism   . On home oxygen therapy    "2L; 9PM til 9AM" (06/27/2017)  . PAD (peripheral artery disease) (HCC)    a. prior gangrene, toe amputations, intervention.  [name] PAF (paroxysmal atrial fibrillation) (HCC)   .  Paraesophageal hernia   . Perforated gastric ulcer (HCC)   . Pneumonia    "a few times" (06/27/2017)  . Seasonal allergies   . SIADH (syndrome of inappropriate ADH production) (HCC)    [name] 01/10/2015  . Small bowel obstruction (HCC)    "I don't know how many" (01/11/2015)  . Stroke [facility])    several- no residual  . Type II diabetes mellitus (HCC)    "related to prednisone use  for > 20 yr; once predinose stopped; no more DM RX" (01/27/2016)  . UTI (urinary tract infection) 02/08/2016  . Ventral hernia with bowel obstruction    Past Surgical History:  Procedure Laterality Date  . ABDOMINAL AORTOGRAM N/A 09/21/2016   Procedure: Abdominal Aortogram;  Surgeon: Maeola Harman, MD;  Location: [facility] INVASIVE CV LAB;  Service: Cardiovascular;  Laterality: N/A;  . AMPUTATION Left 09/25/2016   Procedure: AMPUTATION DIGIT- LEFT 2ND AND 3RD TOES;  Surgeon: Larina Earthly, MD;  Location: [facility] OR;  Service: Vascular;  Laterality: Left;  . AMPUTATION Right 05/03/2018   Procedure: AMPUTATION RIGHT GREAT TOE;  Surgeon: Larina Earthly, MD;  Location: [facility] OR;  Service: Vascular;  Laterality: Right;  . CATARACT EXTRACTION W/ INTRAOCULAR LENS  IMPLANT, BILATERAL    . CHOLECYSTECTOMY OPEN    . COLECTOMY    . ESOPHAGOGASTRODUODENOSCOPY N/A 01/19/2014   Procedure: ESOPHAGOGASTRODUODENOSCOPY (EGD);  Surgeon: Hilarie Fredrickson, MD;  Location: [facility] ENDOSCOPY;  Service: Endoscopy;  Laterality: N/A;  . ESOPHAGOGASTRODUODENOSCOPY N/A 01/20/2014   Procedure: ESOPHAGOGASTRODUODENOSCOPY (EGD);  Surgeon: Hilarie Fredrickson, MD;  Location: [facility] ENDOSCOPY;  Service: Endoscopy;  Laterality: N/A;  . ESOPHAGOGASTRODUODENOSCOPY N/A 03/19/2014   Procedure: ESOPHAGOGASTRODUODENOSCOPY (EGD);  Surgeon: Rachael Fee, MD;  Location: [facility] ENDOSCOPY;  Service: Endoscopy;  Laterality: N/A;  . ESOPHAGOGASTRODUODENOSCOPY N/A 07/08/2015   Procedure: ESOPHAGOGASTRODUODENOSCOPY (EGD);  Surgeon: Sherrilyn Rist, MD;  Location: [facility] ENDOSCOPY;  Service:  Endoscopy;  Laterality: N/A;  . ESOPHAGOGASTRODUODENOSCOPY (EGD) WITH PROPOFOL N/A 09/15/2015   Procedure: ESOPHAGOGASTRODUODENOSCOPY (EGD) WITH PROPOFOL;  Surgeon: Ruffin Frederick, MD;  Location: [facility] ENDOSCOPY;  Service: Gastroenterology;  Laterality: N/A;  . GASTROJEJUNOSTOMY     hx/notes 01/10/2015  . GASTROJEJUNOSTOMY N/A 09/23/2015   Procedure: OPEN GASTROJEJUNOSTOMY TUBE PLACEMENT;  Surgeon: De Blanch Kinsinger, MD;  Location: [facility] ORS;  Service: General;  Laterality: N/A;  . GLAUCOMA SURGERY Bilateral   . HERNIA REPAIR  2015  . IR CM INJ ANY COLONIC TUBE W/FLUORO  01/05/2017  . IR CM INJ ANY COLONIC TUBE W/FLUORO  06/07/2017  . IR CM INJ ANY COLONIC TUBE W/FLUORO  11/05/2017  . IR CM INJ ANY COLONIC TUBE W/FLUORO  09/18/2018  . IR CM INJ ANY COLONIC TUBE W/FLUORO  03/06/2019  . IR CM INJ ANY COLONIC TUBE W/FLUORO  03/11/2019  . IR GENERIC HISTORICAL  01/07/2016   IR GJ TUBE CHANGE 01/07/2016 Malachy Moan, MD [facility]  . IR GENERIC HISTORICAL  01/27/2016   IR MECH REMOV OBSTRUC MAT ANY COLON TUBE W/FLUORO 01/27/2016 Richarda Overlie, MD [facility]  . IR  GENERIC HISTORICAL  02/07/2016   IR PATIENT EVAL TECH 0-60 MINS Darrell K Allred, PA-C [facility]  . IR GENERIC HISTORICAL  02/08/2016   IR GJ TUBE CHANGE 02/08/2016 Berdine Dance, MD [facility]  . IR GENERIC HISTORICAL  01/06/2016   IR GJ TUBE CHANGE 01/06/2016 [facility] OUT REF  . IR GENERIC HISTORICAL  05/02/2016   IR CM INJ ANY COLONIC TUBE W/FLUORO 05/02/2016 Oley Balm, MD [facility]  . IR GENERIC HISTORICAL  05/15/2016   IR GJ TUBE CHANGE 05/15/2016 Simonne Come, MD [facility]  . IR GENERIC HISTORICAL  06/28/2016   IR GJ TUBE CHANGE 06/28/2016 [facility]  . IR GJ TUBE CHANGE  02/20/2017  . IR GJ TUBE CHANGE  05/10/2017  . IR GJ TUBE CHANGE  07/06/2017  . IR GJ TUBE CHANGE  08/02/2017  . IR GJ TUBE CHANGE  10/15/2017  . IR GJ TUBE CHANGE  12/26/2017  . IR GJ TUBE CHANGE  04/08/2018  . IR GJ TUBE CHANGE  06/19/2018  . IR GJ  TUBE CHANGE  03/03/2019  . IR PATIENT EVAL TECH 0-60 MINS  10/19/2016  . IR PATIENT EVAL TECH 0-60 MINS  12/25/2016  . IR PATIENT EVAL TECH 0-60 MINS  01/29/2017  . IR PATIENT EVAL TECH 0-60 MINS  04/04/2017  . IR PATIENT EVAL TECH 0-60 MINS  04/30/2017  . IR PATIENT EVAL TECH 0-60 MINS  07/31/2017  . IR REPLC DUODEN/JEJUNO TUBE PERCUT W/FLUORO  11/14/2016  . LAPAROTOMY N/A 01/20/2015   Procedure: EXPLORATORY LAPAROTOMY;  Surgeon: Abigail Miyamoto, MD;  Location: [facility] OR;  Service: General;  Laterality: N/A;  . LOWER EXTREMITY ANGIOGRAPHY Left 09/21/2016   Procedure: Lower Extremity Angiography;  Surgeon: Maeola Harman, MD;  Location: [facility] INVASIVE CV LAB;  Service: Cardiovascular;  Laterality: Left;  . LOWER EXTREMITY ANGIOGRAPHY Right 06/27/2017   Procedure: Lower Extremity Angiography;  Surgeon: Maeola Harman, MD;  Location: [facility] INVASIVE CV LAB;  Service: Cardiovascular;  Laterality: Right;  . LYSIS OF ADHESION N/A 01/20/2015   Procedure: LYSIS OF ADHESIONS < 1 HOUR;  Surgeon: Abigail Miyamoto, MD;  Location: [facility] OR;  Service: General;  Laterality: N/A;  . PERIPHERAL VASCULAR BALLOON ANGIOPLASTY Left 09/21/2016   Procedure: Peripheral Vascular Balloon Angioplasty;  Surgeon: Maeola Harman, MD;  Location: [facility] INVASIVE CV LAB;  Service: Cardiovascular;  Laterality: Left;  drug coated balloon  . PERIPHERAL VASCULAR BALLOON ANGIOPLASTY Right 06/27/2017   Procedure: PERIPHERAL VASCULAR BALLOON ANGIOPLASTY;  Surgeon: Maeola Harman, MD;  Location: [facility] INVASIVE CV LAB;  Service: Cardiovascular;  Laterality: Right;  SFA/TPTRUNK  . TONSILLECTOMY    . TUBAL LIGATION    . VENTRAL HERNIA REPAIR  2015   incarcerated ventral hernia ([facility] 09/2013)/notes 01/10/2015     Current Meds  Medication Sig  . acetaminophen (TYLENOL) 500 MG tablet Take 500 mg by mouth every 6 (six) hours as needed (pain).   . Amino Acids-Protein Hydrolys (FEEDING SUPPLEMENT, PRO-STAT SUGAR FREE 64,)  LIQD Take 30 mLs by mouth 2 (two) times daily.  [name] amLODipine (NORVASC) 5 MG tablet Take 1 tablet (5 mg total) by mouth daily.  [name] atorvastatin (LIPITOR) 10 MG tablet Take 1 tablet (10 mg total) by mouth daily.  [name] b complex vitamins tablet Take 1 tablet by mouth daily.   . calcium-vitamin D (OSCAL WITH D) 500-200 MG-UNIT per tablet Take 1 tablet by mouth daily with breakfast.   . Carboxymethylcellulose Sodium (ARTIFICIAL TEARS OP) Place 1 drop into both eyes 4 (  four) times daily.  . collagenase (SANTYL) ointment Apply 1 application topically daily as needed (wound care).   . cycloSPORINE (RESTASIS) 0.05 % ophthalmic emulsion USE 1 DROP INTO BOTH EYES TWICE DAILY  . DULERA 200-5 MCG/ACT AERO TAKE TWO PUFFS BY MOUTH TWICE DAILY  . feeding supplement (OSMOLITE 1 CAL) LIQD Take 711 mLs by mouth See admin instructions. For 12 hours 2100 - 0900  . ferrous sulfate 325 (65 FE) MG tablet TAKE 1 TABLET BY MOUTH EVERY DAY  . fluticasone (FLONASE) 50 MCG/ACT nasal spray SPRAY 2 SPRAYS INTO EACH NOSTRIL EVERY DAY (Patient taking differently: Place 2 sprays into both nostrils daily. SPRAY 2 SPRAYS INTO EACH NOSTRIL EVERY DAY)  . furosemide (LASIX) 40 MG tablet Alternate taking 40 mg and 20 mg every other day  . gabapentin (NEURONTIN) 300 MG capsule TAKE 1 CAPSULE BY MOUTH TWICE A DAY  . gentamicin cream (GARAMYCIN) 0.1 % Apply 1 application topically 3 (three) times daily as needed (dry skin).   . hydrALAZINE (APRESOLINE) 50 MG tablet TAKE 1 TABLET BY MOUTH THREE TIMES A DAY  . insulin aspart (NOVOLOG) 100 UNIT/ML FlexPen Inject 0-6 Units into the skin 3 (three) times daily with meals. New sliding Scale If BS is 151 - 251 = 2 units,  If BS is 251 -  350 = 4 units,  If BS is 351 - 450 = 6 units Call provider if BS runs more than 400  . Insulin Glargine (LANTUS SOLOSTAR) 100 UNIT/ML Solostar Pen Inject 8 Units into the skin at bedtime.  . Insulin Pen Needle (B-D UF III MINI PEN NEEDLES) 31G X 5 MM MISC Use twice  daily with giving insulin injections. Dx E11.9  . ipratropium-albuterol (DUONEB) 0.5-2.5 (3) MG/3ML SOLN Take 3 mLs by nebulization every 6 (six) hours as needed (sob). Use 3 times daily x 4 days then every 6 hours as needed.  . isosorbide mononitrate (IMDUR) 30 MG 24 hr tablet TAKE 2 TABLETS BY MOUTH DAILY. PLEASE MAKE ANNUAL APPT WITH DR. Delton See FOR FUTURE REFILLS. THANK YOU. 1ST ATTEMPT. (Patient taking differently: Take 60 mg by mouth daily. )  . latanoprost (XALATAN) 0.005 % ophthalmic solution Place 1 drop into both eyes at bedtime.  . levalbuterol (XOPENEX HFA) 45 MCG/ACT inhaler Inhale 2 puffs into the lungs every 6 (six) hours as needed for wheezing or shortness of breath.  . levothyroxine (SYNTHROID, LEVOTHROID) 125 MCG tablet Take 1 tablet (125 mcg total) by mouth daily.  [name] loratadine (CLARITIN) 10 MG tablet Take 10 mg by mouth daily as needed for allergies.  . Multiple Vitamin (MULTIVITAMIN WITH MINERALS) TABS tablet Take 1 tablet by mouth daily.  [name] nystatin (MYCOSTATIN/NYSTOP) powder Apply 1 g topically as needed (skin irritation).   [name] oxyCODONE (ROXICODONE) 5 MG immediate release tablet Take 1 tablet (5 mg total) by mouth every 4 (four) hours as needed for severe pain. May take 1/2 tab q4hrs as needed for mild pain.  . OXYGEN Inhale 2 L into the lungs at bedtime.  . pantoprazole (PROTONIX) 40 MG tablet TAKE 1 TABLET BY MOUTH TWICE A DAY  . sertraline (ZOLOFT) 50 MG tablet Take 1 tablet (50 mg total) by mouth daily.  [name] warfarin (COUMADIN) 2.5 MG tablet TAKE AS DIRECTED BY COUMADIN CLINIC. (Patient taking differently: Take 5-7.5 mg by mouth See admin instructions. Take 5mg  by mouth daily, except on Sunday, take 7.5mg  by mouth)  . Water For Irrigation, Sterile (FREE WATER) SOLN Place 150 mLs into feeding tube 3 (  three) times daily.   Cliffton Asters Petrolatum-Mineral Oil (SYSTANE NIGHTTIME) OINT Place 1 application into both eyes at bedtime.      Allergies:   Penicillins   Social History    Tobacco Use  . Smoking status: Never Smoker  . Smokeless tobacco: Former Neurosurgeon    Types: Snuff  . Tobacco comment: "used snuff in my younger days"  Substance Use Topics  . Alcohol use: Never    Alcohol/week: 0.0 standard drinks    Frequency: Never  . Drug use: Never     Family Hx: The patient's family history includes Diabetes in her brother; Glaucoma in her son and son; Hypertension in her mother; Stroke in her mother. There is no history of Heart attack, Colon cancer, or Stomach cancer.  ROS:   Please see the history of present illness.      All other systems reviewed and are negative.   Prior CV studies:   The following studies were reviewed today: 30 day monitor  Sinus rhythm with ventricular rates 59-105 BPM.   Sinus bradycardia to sinus tachycardia, no pauses or arrhythmias. Normal 30 day event monitor.   Carotid dopplers 01/08/19 Summary: Right Carotid: Velocities in the right ICA are consistent with a 1-39% stenosis.                Non-hemodynamically significant plaque <50% noted in the CCA.   Left Carotid: Velocities in the left ICA are consistent with a 1-39% stenosis.               Non-hemodynamically significant plaque <50% noted in the CCA.   Vertebrals:  Bilateral vertebral arteries demonstrate antegrade flow. Subclavians: Normal flow hemodynamics were seen in bilateral subclavian              arteries.   *See table(s) above for measurements and observations.     Echo 11/25/18 IMPRESSIONS      1. The left ventricle has normal systolic function with an ejection fraction of 60-65%. The cavity size was normal. Left ventricular diastolic Doppler parameters are consistent with pseudonormalization. Elevated left ventricular end-diastolic pressure.  2. The right ventricle has normal systolic function. The cavity was normal. There is no increase in right ventricular wall thickness. Right ventricular systolic pressure is normal.  3. Left atrial size was mildly  dilated.  4. There is moderate mitral annular calcification present.  5. The aortic valve is tricuspid. Aortic valve regurgitation is mild by color flow Doppler. Severely calcified left coronary cusp.  6. The aorta is normal unless otherwise noted.   FINDINGS  Left Ventricle: The left ventricle has normal systolic function, with an ejection fraction of 60-65%. The cavity size was normal. There is no increase in left ventricular wall thickness. Left ventricular diastolic Doppler parameters are consistent with  pseudonormalization. Elevated left ventricular end-diastolic pressure   Right Ventricle: The right ventricle has normal systolic function. The cavity was normal. There is no increase in right ventricular wall thickness. Right ventricular systolic pressure is normal.   Left Atrium: Left atrial size was mildly dilated.   Right Atrium: Right atrial size was normal in size.   Interatrial Septum: No atrial level shunt detected by color flow Doppler.   Pericardium: There is no evidence of pericardial effusion.   Mitral Valve: The mitral valve is normal in structure. There is moderate mitral annular calcification present. Mitral valve regurgitation is mild by color flow Doppler.   Tricuspid Valve: The tricuspid valve is normal in structure. Tricuspid valve regurgitation  is mild by color flow Doppler.   Aortic Valve: The aortic valve is tricuspid Aortic valve regurgitation is mild by color flow Doppler. Severely calcified left coronary cusp.   Pulmonic Valve: The pulmonic valve was normal in structure. Pulmonic valve regurgitation is trivial by color flow Doppler.   Aorta: The aorta is normal unless otherwise noted.   Venous: The inferior vena cava measures 1.24 cm, is normal in size with greater than 50% respiratory variability.    carotids 2015 mild plaque bilaterally 1-39%      Labs/Other Tests and Data Reviewed:    EKG:  No ECG reviewed.  Recent Labs: 10/15/2018: TSH 0.98  11/25/2018: B Natriuretic Peptide 233.9 12/26/2018: ALT 19 03/03/2019: BUN 40; Creatinine, Ser 1.23; Hemoglobin 9.0; Platelets 217; Potassium 4.3; Sodium 139   Recent Lipid Panel Lab Results  Component Value Date/Time   CHOL 131 10/15/2018 11:22 AM   CHOL 126 04/07/2015 10:15 AM   TRIG 188 (H) 10/15/2018 11:22 AM   HDL 47 (L) 10/15/2018 11:22 AM   HDL 69 04/07/2015 10:15 AM   CHOLHDL 2.8 10/15/2018 11:22 AM   LDLCALC 58 10/15/2018 11:22 AM    Wt Readings from Last 3 Encounters:  03/12/19 123 lb (55.8 kg)  03/02/19 144 lb 6.4 oz (65.5 kg)  02/19/19 143 lb (64.9 kg)     Objective:    Vital Signs:  BP (!) 166/73   Pulse 73   Wt 123 lb (55.8 kg)   BMI 19.85 kg/m    VITAL SIGNS:  reviewed  ASSESSMENT & PLAN:    Paroxysmal atrial fibrillation/flutter with syncope 11/2018 felt secondary to bradycardia.  Coreg was stopped.  Carotid Dopplers stable.  30-day monitor just returned on Monday and no arrhythmias.  No further syncope.    GI bleed felt secondary to hemorrhoids 12/2018 Coumadin continued.  Hemoglobin has been stable-9.0 on 03/03/2019.  Essential hypertension amlodipine added when Coreg was stopped also on hydralazine and Imdur. She still hasn't taken her meds today. 2 gm sodium diet. Still getting canned soup as it's the only thing she can eat with feeding tube. Needs to try to take BP meds earlier in the am.   Chronic diastolic CHF normal LVEF with diastolic dysfunction on echo 11/2018.  Patient has leg edema and wheezing.  Still Eating canned soup 1/4 cup and broth daily as she has a feeding tube. .  Lasix 20 mg alternating with 40 mg every other day. But now on 20 mg daily. Swelling down. Crt stable 1.23    COVID-19 Education: The signs and symptoms of COVID-19 were discussed with the patient and how to seek care for testing (follow up with PCP or arrange E-visit).   The importance of social distancing was discussed today.  Time:   Today, I have spent 16:30 minutes with  the patient with telehealth technology discussing the above problems.     Medication Adjustments/Labs and Tests Ordered: Current medicines are reviewed at length with the patient today.  Concerns regarding medicines are outlined above.   Tests Ordered: No orders of the defined types were placed in this encounter.   Medication Changes: No orders of the defined types were placed in this encounter.   Follow Up:  Either In Person or Virtual in 2 month(s) Dr. Delton See or Jacolyn Reedy PA-C  Signed, Jacolyn Reedy, PA-C  03/12/2019 1:14 PM    [facility]

## 2019-03-12 NOTE — Patient Instructions (Addendum)
Medication Instructions:  Your physician recommends that you continue on your current medications as directed. Please refer to the Current Medication list given to you today.  You may take an extra lasix AS NEEDED for increased swelling or for a 2-3 lb weight gain overnight.  If you need a refill on your cardiac medications before your next appointment, please call your pharmacy.   Lab work: None Ordered  If you have labs (blood work) drawn today and your tests are completely normal, you will receive your results only by: [name] MyChart Message (if you have MyChart) OR . A paper copy in the mail If you have any lab test that is abnormal or we need to change your treatment, we will call you to review the results.  Testing/Procedures: None ordered  Follow-Up: . Follow up with with Ermalinda Barrios, PA on 05/21/19 at 1:00 PM  Any Other Special Instructions Will Be Listed Below (If Applicable).

## 2019-03-13 ENCOUNTER — Encounter: Payer: Self-pay | Admitting: Family

## 2019-03-13 ENCOUNTER — Ambulatory Visit (INDEPENDENT_AMBULATORY_CARE_PROVIDER_SITE_OTHER): Payer: Medicare Other | Admitting: Family

## 2019-03-13 ENCOUNTER — Other Ambulatory Visit: Payer: Self-pay

## 2019-03-13 VITALS — BP 140/64 | HR 65 | Temp 97.7°F | Ht 66.0 in | Wt 140.2 lb

## 2019-03-13 DIAGNOSIS — J449 Chronic obstructive pulmonary disease, unspecified: Secondary | ICD-10-CM | POA: Diagnosis not present

## 2019-03-13 DIAGNOSIS — I110 Hypertensive heart disease with heart failure: Secondary | ICD-10-CM

## 2019-03-13 DIAGNOSIS — E1121 Type 2 diabetes mellitus with diabetic nephropathy: Secondary | ICD-10-CM

## 2019-03-13 DIAGNOSIS — K648 Other hemorrhoids: Secondary | ICD-10-CM

## 2019-03-13 DIAGNOSIS — I5032 Chronic diastolic (congestive) heart failure: Secondary | ICD-10-CM

## 2019-03-13 DIAGNOSIS — E039 Hypothyroidism, unspecified: Secondary | ICD-10-CM

## 2019-03-13 DIAGNOSIS — Z934 Other artificial openings of gastrointestinal tract status: Secondary | ICD-10-CM

## 2019-03-13 DIAGNOSIS — N1832 Chronic kidney disease, stage 3b: Secondary | ICD-10-CM

## 2019-03-13 DIAGNOSIS — E1122 Type 2 diabetes mellitus with diabetic chronic kidney disease: Secondary | ICD-10-CM

## 2019-03-13 DIAGNOSIS — Z794 Long term (current) use of insulin: Secondary | ICD-10-CM

## 2019-03-13 NOTE — Progress Notes (Addendum)
Provider: Kimberely Mccannon FNP-C  [name], NP  Patient Care Team: [name], NP as PCP - General (Geriatric Medicine) Dorothy Spark, MD as PCP - Cardiology (Cardiology) Edrick Kins, DPM as Consulting Physician (Podiatry) Dorothy Spark, MD as Consulting Physician (Cardiology) [name] [name], NP as Nurse Practitioner (Geriatric Medicine) Camillo Flaming, Clayton as Referring Physician (Optometry) Pleasant, Eppie Gibson, RN as [facility]  Extended Emergency Contact Information Primary Emergency Contact: Armstrong,[name] Address: [address]          [place], [place] [address] Johnnette Litter of [place] Phone: [phone] Mobile Phone: [phone] Relation: Daughter Secondary Emergency Contact: Geiler,John  [place] Phone: [phone] Relation: Son  Code Status:  Full Code  Goals of care: Advanced Directive information Advanced Directives 03/01/2019  Does Patient Have a Medical Advance Directive? Yes  Type of Advance Directive [place]  Does patient want to make changes to medical advance directive? No - Patient declined  Copy of [place] in Chart? Yes - validated most recent copy scanned in chart (See row information)  Would patient like information on creating a medical advance directive? -     Chief Complaint  Patient presents with   Transitions Of Care    Hospitalization from 03/01/2019 - 03/03/2019  Jejunostomy tube fell out, patient states she would like to discuss feeding tube and needs referal in GI stomach and also discuss weight    Medication Management    sore place around rectum and in rectum  would like to see about antiobiotic ointment     HPI:  Pt is a 85 y.o. female seen today for follow up Hospitalization from 03/01/2019 - 03/03/2019  Jejunostomy tube fell out when she coughed.This was the second time tube has dislodged.J-tube was  replaced in the IR.she uses Tube feeding at night from 9 pm -9Am Also still able to eat jello and drink fluid by mouth but states every time she drinks fluid tube leaks.She states would like referal to GI specialist.she states continues to loss weight.Previous wt 143 lbs and Wt 140.2 lbs today.States weight at home ws 136 lbs.   Also complains sore place around rectum and in rectum states has been constipated.she would like to see about antiobiotic ointment to apply.  Hypertension- no B/P readings from home for review. She denies any symptoms of hypotension.on hydralazine,Amlodipine and Imdur.  Congestive heart Failure - reports weight loss.No leg edema,cough or shortness of breath.on Lasix 20 mg tablet.  Type 2 DM- states CBG readings at home in the 170's.on Novolog per SSI and Lantus 8 units at bedtime.  COPD - states breathing stable on Xopenex,DuoNeb and Dulera. Also on Loratadine and Flonase for allergies.No cough,wheezing or shortness of breath reported.Up to date on influenza vaccine.she wears her facial mask and practice social distancing. No contact with sick person with COVID-19.    Past Medical History:  Diagnosis Date   Acute respiratory failure ([facility])    Anemia    previous blood transfusions   Arthritis    "all over"   Asthma    Atrial flutter (H[facility])    Bradycardia    requiring previous d/c of BB and reduction of amiodarone   CAD (coronary artery disease)    nonobstructive per notes   Chronic diastolic CHF (congestive heart failure) (H[facility])    CKD (chronic kidney disease), stage III    Complication of blood transfusion    "got the  wrong blood type at [facility] in ~ 2015; no adverse reaction that we are aware of"/daughter, [name] (01/27/2016)   COPD (chronic obstructive pulmonary disease) ([facility])    Depression    "light case"   DVT (deep venous thrombosis) ([place]) 01/2016   a. LLE DVT 01/2016 - switched from Eliquis to Coumadin.   Dyspnea    with some activity    Gastric stenosis    a. s/p stomach tube   GERD (gastroesophageal reflux disease)    History of blood transfusion    "several" (01/27/2016)   History of stomach ulcers    Hyperlipidemia    Hypertension    Hypothyroidism    On home oxygen therapy    "2L; 9PM til 9AM" (06/27/2017)   PAD (peripheral artery disease) ([facility])    a. prior gangrene, toe amputations, intervention.   PAF (paroxysmal atrial fibrillation) ([facility])    Paraesophageal hernia    Perforated gastric ulcer ([facility])    Pneumonia    "a few times" (06/27/2017)   Seasonal allergies    SIADH (syndrome of inappropriate ADH production) ([place])    [name] 01/10/2015   Small bowel obstruction ([place])    "I don't know how many" (01/11/2015)   Stroke ([facility])    several- no residual   Type II diabetes mellitus ([facility])    "related to prednisone use  for > 20 yr; once predinose stopped; no more DM RX" (01/27/2016)   UTI (urinary tract infection) 02/08/2016   Ventral hernia with bowel obstruction    Past Surgical History:  Procedure Laterality Date   ABDOMINAL AORTOGRAM N/A 09/21/2016   Procedure: Abdominal Aortogram;  Surgeon: Waynetta Sandy, MD;  Location: [facility];  Service: Cardiovascular;  Laterality: N/A;   AMPUTATION Left 09/25/2016   Procedure: AMPUTATION DIGIT- LEFT 2ND AND 3RD TOES;  Surgeon: Rosetta Posner, MD;  Location: [facility];  Service: Vascular;  Laterality: Left;   AMPUTATION Right 05/03/2018   Procedure: AMPUTATION RIGHT GREAT TOE;  Surgeon: Rosetta Posner, MD;  Location: [facility];  Service: Vascular;  Laterality: Right;   CATARACT EXTRACTION W/ INTRAOCULAR LENS  IMPLANT, BILATERAL     CHOLECYSTECTOMY OPEN     COLECTOMY     ESOPHAGOGASTRODUODENOSCOPY N/A 01/19/2014   Procedure: ESOPHAGOGASTRODUODENOSCOPY (EGD);  Surgeon: Irene Shipper, MD;  Location: [facility];  Service: Endoscopy;  Laterality: N/A;   ESOPHAGOGASTRODUODENOSCOPY N/A 01/20/2014   Procedure: ESOPHAGOGASTRODUODENOSCOPY  (EGD);  Surgeon: Irene Shipper, MD;  Location: [facility];  Service: Endoscopy;  Laterality: N/A;   ESOPHAGOGASTRODUODENOSCOPY N/A 03/19/2014   Procedure: ESOPHAGOGASTRODUODENOSCOPY (EGD);  Surgeon: Milus Banister, MD;  Location: [facility];  Service: Endoscopy;  Laterality: N/A;   ESOPHAGOGASTRODUODENOSCOPY N/A 07/08/2015   Procedure: ESOPHAGOGASTRODUODENOSCOPY (EGD);  Surgeon: Doran Stabler, MD;  [facility];  Service: Endoscopy;  Laterality: N/A;   ESOPHAGOGASTRODUODENOSCOPY (EGD) WITH PROPOFOL N/A 09/15/2015   Procedure: ESOPHAGOGASTRODUODENOSCOPY (EGD) WITH PROPOFOL;  Surgeon: Manus Gunning, MD;  Location: [facility];  Service: Gastroenterology;  Laterality: N/A;   GASTROJEJUNOSTOMY     hx/notes 01/10/2015   GASTROJEJUNOSTOMY N/A 09/23/2015   Procedure: OPEN GASTROJEJUNOSTOMY TUBE PLACEMENT;  Surgeon: Arta Bruce Kinsinger, MD;  Location: [facility];  Service: General;  Laterality: N/A;   GLAUCOMA SURGERY Bilateral    HERNIA REPAIR  2015   IR CM INJ ANY COLONIC TUBE W/FLUORO  01/05/2017   IR CM INJ ANY COLONIC TUBE W/FLUORO  06/07/2017   IR CM INJ ANY COLONIC TUBE W/FLUORO  11/05/2017  IR CM INJ ANY COLONIC TUBE W/FLUORO  09/18/2018   IR CM INJ ANY COLONIC TUBE W/FLUORO  03/06/2019   IR CM INJ ANY COLONIC TUBE W/FLUORO  03/11/2019   IR GENERIC HISTORICAL  01/07/2016   IR GJ TUBE CHANGE 01/07/2016 Jacqulynn Cadet, MD [facility]   IR GENERIC HISTORICAL  01/27/2016   IR MECH REMOV OBSTRUC MAT ANY COLON TUBE W/FLUORO 01/27/2016 Markus Daft, MD [facility]   IR GENERIC HISTORICAL  02/07/2016   IR PATIENT EVAL TECH 0-60 MINS Darrell K Allred, PA-C [facility]   IR GENERIC HISTORICAL  02/08/2016   IR GJ TUBE CHANGE 02/08/2016 Greggory Keen, MD [facility]   IR GENERIC HISTORICAL  01/06/2016   IR GJ TUBE CHANGE 01/06/2016 [facility] OUT REF   IR GENERIC HISTORICAL  05/02/2016   IR CM INJ ANY COLONIC TUBE W/FLUORO 05/02/2016 Arne Cleveland, MD [facility]    IR GENERIC HISTORICAL  05/15/2016   IR GJ TUBE CHANGE 05/15/2016 Sandi Mariscal, MD [facility]   IR GENERIC HISTORICAL  06/28/2016   IR GJ TUBE CHANGE 06/28/2016 [facility]   IR GJ TUBE CHANGE  02/20/2017   IR GJ TUBE CHANGE  05/10/2017   IR GJ TUBE CHANGE  07/06/2017   IR GJ TUBE CHANGE  08/02/2017   IR GJ TUBE CHANGE  10/15/2017   IR GJ TUBE CHANGE  12/26/2017   IR GJ TUBE CHANGE  04/08/2018   IR GJ TUBE CHANGE  06/19/2018   IR GJ TUBE CHANGE  03/03/2019   IR PATIENT EVAL TECH 0-60 MINS  10/19/2016   IR PATIENT EVAL TECH 0-60 MINS  12/25/2016   IR PATIENT EVAL TECH 0-60 MINS  01/29/2017   IR PATIENT EVAL TECH 0-60 MINS  04/04/2017   IR PATIENT EVAL TECH 0-60 MINS  04/30/2017   IR PATIENT EVAL TECH 0-60 MINS  07/31/2017   IR REPLC DUODEN/JEJUNO TUBE PERCUT W/FLUORO  11/14/2016   LAPAROTOMY N/A 01/20/2015   Procedure: EXPLORATORY LAPAROTOMY;  Surgeon: Coralie Keens, MD;  Location: [facility];  Service: General;  Laterality: N/A;   LOWER EXTREMITY ANGIOGRAPHY Left 09/21/2016   Procedure: Lower Extremity Angiography;  Surgeon: Waynetta Sandy, MD;  Location: [facility];  Service: Cardiovascular;  Laterality: Left;   LOWER EXTREMITY ANGIOGRAPHY Right 06/27/2017   Procedure: Lower Extremity Angiography;  Surgeon: Waynetta Sandy, MD;  Location: [facility];  Service: Cardiovascular;  Laterality: Right;   LYSIS OF ADHESION N/A 01/20/2015   Procedure: LYSIS OF ADHESIONS < 1 HOUR;  Surgeon: Coralie Keens, MD;  Location: [facility];  Service: General;  Laterality: N/A;   PERIPHERAL VASCULAR BALLOON ANGIOPLASTY Left 09/21/2016   Procedure: Peripheral Vascular Balloon Angioplasty;  Surgeon: Waynetta Sandy, MD;  Location: [facility];  Service: Cardiovascular;  Laterality: Left;  drug coated balloon   PERIPHERAL VASCULAR BALLOON ANGIOPLASTY Right 06/27/2017   Procedure: PERIPHERAL VASCULAR BALLOON ANGIOPLASTY;  Surgeon: Waynetta Sandy,  MD;  Location: [facility];  Service: Cardiovascular;  Laterality: Right;  SFA/[id number]   TONSILLECTOMY     TUBAL LIGATION     VENTRAL HERNIA REPAIR  2015   incarcerated ventral hernia ([facility] 09/2013)/notes 01/10/2015    Allergies  Allergen Reactions   Penicillins Itching, Rash and Other (See Comments)    Tolerated amoxicillin, unasyn, zosyn & cephalosporins in the past. Did it involve swelling of the face/tongue/throat, SOB, or low BP? No Did it involve sudden or severe rash/hives, skin peeling, or any reaction  on the inside of your mouth or nose? No Did you need to seek medical attention at a hospital or doctor's office? Unknown When did it last happen?5+ years If all above answers are "NO", may proceed with cephalosporin use.     Outpatient Encounter Medications as of 03/13/2019  Medication Sig   acetaminophen (TYLENOL) 500 MG tablet Take 500 mg by mouth every 6 (six) hours as needed (pain).    Amino Acids-Protein Hydrolys (FEEDING SUPPLEMENT, PRO-STAT SUGAR FREE 64,) LIQD Take 30 mLs by mouth 2 (two) times daily.   amLODipine (NORVASC) 5 MG tablet Take 1 tablet (5 mg total) by mouth daily.   atorvastatin (LIPITOR) 10 MG tablet Take 1 tablet (10 mg total) by mouth daily.   b complex vitamins tablet Take 1 tablet by mouth daily.    calcium-vitamin D (OSCAL WITH D) 500-200 MG-UNIT per tablet Take 1 tablet by mouth daily with breakfast.    Carboxymethylcellulose Sodium (ARTIFICIAL TEARS OP) Place 1 drop into both eyes 4 (four) times daily.   collagenase (SANTYL) ointment Apply 1 application topically daily as needed (wound care).    cycloSPORINE (RESTASIS) 0.05 % ophthalmic emulsion USE 1 DROP INTO BOTH EYES TWICE DAILY   DULERA 200-5 MCG/ACT AERO TAKE TWO PUFFS BY MOUTH TWICE DAILY   feeding supplement (OSMOLITE 1 CAL) LIQD Take 711 mLs by mouth See admin instructions. For 12 hours 2100 - 0900   ferrous sulfate 325 (65 FE) MG tablet TAKE 1 TABLET BY MOUTH  EVERY DAY   fluticasone (FLONASE) 50 MCG/ACT nasal spray SPRAY 2 SPRAYS INTO EACH NOSTRIL EVERY DAY   furosemide (LASIX) 20 MG tablet Take 20 mg by mouth See admin instructions. Daily unless swelling occurs take 40 mg tablet and then switch back to 20 mg daily   gabapentin (NEURONTIN) 300 MG capsule TAKE 1 CAPSULE BY MOUTH TWICE A DAY   gentamicin cream (GARAMYCIN) 0.1 % Apply 1 application topically 3 (three) times daily as needed (dry skin).    hydrALAZINE (APRESOLINE) 50 MG tablet TAKE 1 TABLET BY MOUTH THREE TIMES A DAY   insulin aspart (NOVOLOG) 100 UNIT/ML FlexPen Inject 0-6 Units into the skin 3 (three) times daily with meals. New sliding Scale If BS is 151 - 251 = 2 units,  If BS is 251 -  350 = 4 units,  If BS is 351 - 450 = 6 units Call provider if BS runs more than 400   Insulin Glargine (LANTUS SOLOSTAR) 100 UNIT/ML Solostar Pen Inject 8 Units into the skin at bedtime.   Insulin Pen Needle (B-D UF III MINI PEN NEEDLES) 31G X 5 MM MISC Use twice daily with giving insulin injections. Dx E11.9   ipratropium-albuterol (DUONEB) 0.5-2.5 (3) MG/3ML SOLN Take 3 mLs by nebulization every 6 (six) hours as needed (sob). Use 3 times daily x 4 days then every 6 hours as needed.   isosorbide mononitrate (IMDUR) 30 MG 24 hr tablet TAKE 2 TABLETS BY MOUTH DAILY. PLEASE MAKE ANNUAL APPT WITH DR. Meda Coffee FOR FUTURE REFILLS. THANK YOU. 1ST ATTEMPT.   latanoprost (XALATAN) 0.005 % ophthalmic solution Place 1 drop into both eyes at bedtime.   levalbuterol (XOPENEX HFA) 45 MCG/ACT inhaler Inhale 2 puffs into the lungs every 6 (six) hours as needed for wheezing or shortness of breath.   levothyroxine (SYNTHROID, LEVOTHROID) 125 MCG tablet Take 1 tablet (125 mcg total) by mouth daily.   loratadine (CLARITIN) 10 MG tablet Take 10 mg by mouth daily as needed for allergies.  Multiple Vitamin (MULTIVITAMIN WITH MINERALS) TABS tablet Take 1 tablet by mouth daily.   nystatin (MYCOSTATIN/NYSTOP) powder  Apply 1 g topically as needed (skin irritation).    oxyCODONE (ROXICODONE) 5 MG immediate release tablet Take 1 tablet (5 mg total) by mouth every 4 (four) hours as needed for severe pain. May take 1/2 tab q4hrs as needed for mild pain.   OXYGEN Inhale 2 L into the lungs at bedtime.   pantoprazole (PROTONIX) 40 MG tablet TAKE 1 TABLET BY MOUTH TWICE A DAY   sertraline (ZOLOFT) 50 MG tablet Take 1 tablet (50 mg total) by mouth daily.   warfarin (COUMADIN) 2.5 MG tablet See admin instructions. Take 5mg  by mouth daily, except on Sunday, take 7.5mg  by mouth   Water For Irrigation, Sterile (FREE WATER) SOLN Place 150 mLs into feeding tube 3 (three) times daily.    White Petrolatum-Mineral Oil (SYSTANE NIGHTTIME) OINT Place 1 application into both eyes at bedtime.    [DISCONTINUED] furosemide (LASIX) 40 MG tablet Alternate taking 40 mg and 20 mg every other day   [DISCONTINUED] warfarin (COUMADIN) 2.5 MG tablet TAKE AS DIRECTED BY COUMADIN CLINIC. (Patient taking differently: Take 5-7.5 mg by mouth See admin instructions. Take 5mg  by mouth daily, except on Sunday, take 7.5mg  by mouth)   No facility-administered encounter medications on file as of 03/13/2019.    Review of Systems  Constitutional: Negative for appetite change, chills, fatigue and fever.  HENT: Positive for hearing loss. Negative for congestion, rhinorrhea, sinus pressure, sinus pain, sneezing, sore throat and trouble swallowing.   Eyes: Negative for discharge, redness and itching.       Wears eye glasses   Respiratory: Negative for cough, chest tightness, shortness of breath and wheezing.   Cardiovascular: Negative for chest pain, palpitations and leg swelling.  Gastrointestinal: Positive for constipation. Negative for abdominal distention, abdominal pain, diarrhea, nausea and vomiting.  Endocrine: Negative for cold intolerance, heat intolerance, polydipsia, polyphagia and polyuria.  Genitourinary: Negative for difficulty  urinating, dysuria, flank pain, frequency and urgency.  Musculoskeletal: Positive for gait problem.    Immunization History  Administered Date(s) Administered   Fluad Quad(high Dose 65+) 12/02/2018   Influenza, High Dose Seasonal PF 01/19/2017   Influenza,inj,Quad PF,6+ Mos 12/24/2013, 01/27/2015, 12/29/2015, 12/04/2017   Influenza-Unspecified 12/02/2012   Pneumococcal Conjugate-13 02/03/2014   Pneumococcal Polysaccharide-23 08/31/2016   Tdap 07/18/2016   Zoster 04/12/2011   Pertinent  Health Maintenance Due  Topic Date Due   FOOT EXAM  08/22/2017   HEMOGLOBIN A1C  08/30/2019   OPHTHALMOLOGY EXAM  12/16/2019   INFLUENZA VACCINE  Completed   DEXA SCAN  Completed   PNA vac Low Risk Adult  Completed   Fall Risk  03/13/2019 02/12/2019 10/10/2018 08/27/2018 08/07/2018  Falls in the past year? 0 0 0 0 0  Number falls in past yr: 0 0 0 0 -  Injury with Fall? 0 0 0 0 -  Risk for fall due to : - - - - -       Vitals:   03/13/19 1125  BP: 140/64  Pulse: 65  Temp: 97.7 F (36.5 C)  TempSrc: Temporal  SpO2: 99%  Weight: 140 lb 3.2 oz (63.6 kg)  Height: 5\' 6"  (1.676 m)   Body mass index is 22.63 kg/m. Physical Exam Vitals reviewed.  Constitutional:      General: She is not in acute distress.    Appearance: She is normal weight. She is not ill-appearing.  HENT:  Head: Normocephalic.     Right Ear: Tympanic membrane, ear canal and external ear normal. There is no impacted cerumen.     Left Ear: Tympanic membrane, ear canal and external ear normal. There is no impacted cerumen.     Nose: Nose normal. No congestion or rhinorrhea.     Mouth/Throat:     Mouth: Mucous membranes are moist.     Pharynx: Oropharynx is clear. No oropharyngeal exudate or posterior oropharyngeal erythema.  Eyes:     General: No scleral icterus.       Right eye: No discharge.        Left eye: No discharge.     Extraocular Movements: Extraocular movements intact.      Conjunctiva/sclera: Conjunctivae normal.     Pupils: Pupils are equal, round, and reactive to light.  Neck:     Vascular: No carotid bruit.  Cardiovascular:     Rate and Rhythm: Normal rate. Rhythm irregular.     Pulses: Normal pulses.     Heart sounds: Normal heart sounds. No murmur. No friction rub. No gallop.   Pulmonary:     Effort: Pulmonary effort is normal. No respiratory distress.     Breath sounds: Normal breath sounds. No wheezing, rhonchi or rales.  Chest:     Chest wall: No tenderness.  Abdominal:     General: Bowel sounds are normal. There is no distension.     Palpations: Abdomen is soft. There is no mass.     Tenderness: There is no abdominal tenderness. There is no right CVA tenderness, left CVA tenderness, guarding or rebound.     Comments: LUQ J-tube in place drainage dressing dry ,clean and intact.No J-tube site  Shows no signs of infection.   Musculoskeletal:        General: No swelling or tenderness.     Cervical back: Normal range of motion. No rigidity or tenderness.     Comments: Unsteady gait walks with a Rolator.bilateral lower extremities trace edema.   Lymphadenopathy:     Cervical: No cervical adenopathy.  Skin:    General: Skin is warm and dry.     Coloration: Skin is not pale.     Findings: No bruising, erythema or rash.  Neurological:     Mental Status: She is alert. Mental status is at baseline.     Cranial Nerves: No cranial nerve deficit.     Motor: No weakness.     Gait: Gait abnormal.  Psychiatric:        Mood and Affect: Mood normal.        Behavior: Behavior normal.        Thought Content: Thought content normal.        Judgment: Judgment normal.     Labs reviewed: Recent Labs    03/01/19 2145 03/02/19 0242 03/02/19 0731 03/03/19 0355  NA 136  --  136 139  K 4.4  --  4.1 4.3  CL 102  --  105 108  CO2 25  --  24 23  GLUCOSE 91  --  93 95  BUN 60*  --  52* 40*  CREATININE 1.46* 1.43* 1.39* 1.23*  CALCIUM 9.5  --  9.1 9.1    Recent Labs    11/24/18 2036 12/25/18 1518 12/26/18 0533  AST 32 27 24  ALT 20 20 19   ALKPHOS 71 87 84  BILITOT 0.5 0.5 0.6  PROT 7.6 7.9 7.4  ALBUMIN 3.8 3.8 3.5   Recent Labs  11/24/18 2036 12/02/18 1555 12/25/18 1518 03/01/19 2145 03/02/19 0242 03/02/19 0731 03/03/19 0355  WBC 6.5 5.7   < > 6.0 5.2 6.3 4.6  NEUTROABS 4.2 3,631  --  3.6  --   --   --   HGB 9.6* 9.6*   < > 9.4* 9.2* 9.8* 9.0*  HCT 29.7* 29.2*   < > 29.1* 28.6* 30.0* 27.5*  MCV 97.4 94.8   < > 93.9 93.8 94.9 93.5  PLT 201 225   < > 228 214 224 217   < > = values in this interval not displayed.   Lab Results  Component Value Date   TSH 0.98 10/15/2018   Lab Results  Component Value Date   HGBA1C 6.2 (H) 03/02/2019   Lab Results  Component Value Date   CHOL 131 10/15/2018   HDL 47 (L) 10/15/2018   LDLCALC 58 10/15/2018   TRIG 188 (H) 10/15/2018   CHOLHDL 2.8 10/15/2018    Significant Diagnostic Results in last 30 days:  IR GJ Tube Change  Result Date: 03/03/2019 INDICATION: Inadvertent removal of chronic feeding gastrojejunostomy tube. Gastrostomy track was maintained with placement of a Foley catheter at the patient's bedside. Patient now presents for fluoroscopic guided replacement of chronic feeding GJ tube. EXAM: FLUOROSCOPIC GUIDED REPLACEMENT OF GASTROJEJUNOSTOMY TUBE COMPARISON:  Gastrostomy tube injection-09/18/2018; CT abdomen and pelvis-12/25/2018 MEDICATIONS: None. CONTRAST:  15 cc Isovue-300-administered into the gastric lumen and proximal small bowel. FLUOROSCOPY TIME:  4 minutes (01.7 mGy) COMPLICATIONS: None. PROCEDURE: Informed written consent was obtained from the patient after a discussion of the risks and benefits. The upper abdomen and the external portion of the existing Foley catheter placed bedside to maintain patency of the chronic gastrostomy track was prepped and draped in the usual sterile fashion, and a sterile drape was applied covering the operative field. Maximum  barrier sterile technique with sterile gowns and gloves were used for the procedure. A timeout was performed prior to the initiation of the procedure. Existing catheter was injected with a small amount of contrast demonstrated appropriate positioning within the gastric lumen. The external portion of the existing catheter was cut and the track was cannulated with a Kumpe catheter. With the use of a stiff Glidewire, the Kumpe catheter was manipulated into the proximal small bowel. Contrast injection confirmed appropriate positioning Next, under intermittent fluoroscopic guidance, the Kumpe catheter was exchanged for a new 48 French, 45 cm balloon retention gastrojejunostomy catheter. The retention balloon was insufflated with approximately 10 cc of saline and dilute contrast and pulled against the anterior abdominal wall. The external disc was cinched. Both lumens were injected with a small amount contrast demonstrated appropriate position functionality. Both lumens were flushed with a small amount of saline. Dressings were applied. The patient tolerated the procedure well without immediate postprocedural complication. IMPRESSION: Successful fluoroscopic guided replacement of new 22 French, 45 cm balloon retention gastrojejunostomy catheter with the tip of the jejunostomy lumen terminating within the proximal jejunum. Both lumens ready for immediate use. Electronically Signed   By: Sandi Mariscal M.D.   On: 03/03/2019 17:11   CARDIAC EVENT MONITOR  Result Date: 02/28/2019  Sinus rhythm with ventricular rates 59-105 BPM.  Sinus bradycardia to sinus tachycardia, no pauses or arrhythmias. Normal 30 day event monitor.   IR Cm Inj Any Colonic Tube W/Fluoro  Result Date: 03/11/2019 INDICATION: 85 year old with a GJ feeding tube. Patient complains of leakage around the tube when she eats by mouth. EXAM: INJECTION OF GJ FEEDING TUBE MEDICATIONS: None  ANESTHESIA/SEDATION: None CONTRAST:  20 mL Omnipaque 300-administered  into the gastric lumen. FLUOROSCOPY TIME:  Fluoroscopy Time: 1 minutes, 12 seconds, 8 mGy COMPLICATIONS: None immediate. PROCEDURE: We had the patient drink while she was sitting up to see if there was any significant leakage from [place] feeding tube site. No significant leakage identified. The GJ tube feeding tube was injected with contrast under fluoroscopy. The balloon was slightly repositioned and a small amount of contrast was placed in the balloon in order to identify with fluoroscopy. Both the G and J lumens were injected. Both lumens were flushed with saline at the end of the procedure. FINDINGS: GJ tube is appropriately positioned. The retention balloon has approximately 7 mL of fluid in it. The balloon was pulled up against the abdominal wall and appears to be appropriately position. Gastric lumen is well positioned in the stomach. Contrast was pooling in the gastric fundus region and may be related to patient's positioning or gastroparesis. The J tube is well positioned in the small bowel. IMPRESSION: GJ feeding tube is appropriately positioned. Leaking from the [place] tube after the patient eats or drinks may be related to delayed or poor gastric emptying. Electronically Signed   By: Markus Daft M.D.   On: 03/11/2019 17:46   IR Cm Inj Any Colonic Tube W/Fluoro  Result Date: 03/06/2019 INDICATION: 85 year old with a GJ feeding tube and bleeding at insertion site. Patient also complains of nausea when eating by mouth. EXAM: INJECTION OF GJ FEEDING TUBE MEDICATIONS: None ANESTHESIA/SEDATION: None CONTRAST:  5 mL-administered into the gastric/jejunal lumens. FLUOROSCOPY TIME:  Fluoroscopy Time: 30 seconds, 4 mGy COMPLICATIONS: None immediate. PROCEDURE: Contrast was injected through the jejunal lumen and the gastric lumen. Both lumens were flushed with saline. It was noted that the retention balloon may be causing partial obstruction of the gastric lumen. Therefore, 3 mL of fluid was removed from the retention  balloon. FINDINGS: Stable position of the GJ feeding tube. Tip of the tube is in jejunum. Gastric lumen fills the stomach. 3 mL of fluid was removed from the balloon in order to decrease the risk of gastric lumen obstruction. IMPRESSION: GJ feeding tube is well positioned. Small amount of fluid was removed from the retention balloon in order to decrease the risk of gastric lumen obstruction. Electronically Signed   By: Markus Daft M.D.   On: 03/06/2019 15:20    Assessment/Plan 1. Jejunostomy tube present ([place]) Reports leaking around the tube.Drainage dressing dressing dry,clean and intact during visit.Refer to GI specialist for evaluation of leakage.  - Ambulatory referral to Gastroenterology  2. Hypertensive heart disease with chronic diastolic congestive heart failure (HCC) B/p not at goal for diabetes though given her age will avoid tight control to prevent falls .continue on hydralazine,Amlodipine and Imdur  3. Chronic diastolic CHF (congestive heart failure) (HCC) No signs of fluid overload.continue on Furosemide, Hydralazine and Imdur  4. COPD with asthma ([place]) Breathing is stable.contnue on Xopenex,DuoNeb and Dulera. Also on Loratadine and Flonase for allergies  5. Type 2 diabetes mellitus with stage 3b chronic kidney disease, with long-term current use of insulin (HCC) Lab Results  Component Value Date   HGBA1C 6.2 (H) 03/02/2019  Controlled.Continue on Novolog per SSI and Lantus 8 units at bedtime.Up to date with annual eye and foot exam.    6. Hypothyroidism, unspecified type Lab Results  Component Value Date   TSH 0.98 10/15/2018  Continue on Levothyroxine 125 mcg tablet daily.   7. Internal hemorrhoid No bleeding noted.Encourage to apply  Preparation H suppositories has used it in the past and has it at home per daughter.  - Ambulatory referral to Gastroenterology  Family/ staff Communication: Reviewed plan of care with patient and daughter.  Labs/tests ordered: None    Mirka Barbone C Arlinda Barcelona, NP

## 2019-03-17 ENCOUNTER — Telehealth: Payer: Self-pay | Admitting: Internal Medicine

## 2019-03-17 ENCOUNTER — Other Ambulatory Visit: Payer: Self-pay | Admitting: *Deleted

## 2019-03-17 NOTE — Telephone Encounter (Signed)
I spoke with Shannon [name] and made her aware that the pt would need an appt with IR or surgeon that placed the tube.  She does have an appt on 04/29/19 with Dr Henrene Pastor as a follow up.  Shannon [name] will call the PCP and get information on getting the pt feeding tube leak fixed.  She will call back with any further concerns or questions.

## 2019-03-18 ENCOUNTER — Telehealth: Payer: Self-pay | Admitting: *Deleted

## 2019-03-18 ENCOUNTER — Telehealth: Payer: Self-pay | Admitting: Internal Medicine

## 2019-03-18 ENCOUNTER — Other Ambulatory Visit: Payer: Self-pay | Admitting: Nurse Practitioner

## 2019-03-18 DIAGNOSIS — K9423 Gastrostomy malfunction: Secondary | ICD-10-CM

## 2019-03-18 NOTE — Telephone Encounter (Signed)
Spoke with Ria Comment, [facility], they don't have any sooner appts available. She is going to forward this message to Dr Henrene Pastor & his RN to advise on if Ms. Mcavoy should see another MD sooner.  Waiting for call back Thanks, Lattie Haw

## 2019-03-18 NOTE — Telephone Encounter (Signed)
okay, the nurse lead me to believe it was a tube malfunction but this has already been evaluated. Okay GI is the way to go, may Lattie Haw can work with getting her a sooner appt somewhere else if possible?

## 2019-03-18 NOTE — Telephone Encounter (Signed)
Noted  

## 2019-03-18 NOTE — Telephone Encounter (Signed)
Tried calling patient's daughter, in shower, to call back in 20 minutes per husband.

## 2019-03-18 NOTE — Telephone Encounter (Signed)
Received call back from Dr Blanch Media RN [name]), she made an appt for Ms. Ailis for thurs 12/17 at 320p. Spoke with daughter & she is aware of appt  Thanks, Vilinda Blanks.

## 2019-03-18 NOTE — Telephone Encounter (Signed)
Lattie Haw called back gave her appointment information.  She will let patient know

## 2019-03-18 NOTE — Telephone Encounter (Signed)
-----   Message from [name], NP sent at 03/18/2019  9:09 AM EST ----- Msg sent to me via chat from nurse from [facility] about her leaking peg tube. Recommended IR referral which is appropriate. Order has been placed. Will you call the daughter and notify her of this and see if there is anything else she needs?

## 2019-03-18 NOTE — Telephone Encounter (Signed)
Called and left message for Shannon [name] to call back, if pt can make it she can see Dr. Henrene Pastor 03/20/19@3 :20pm.

## 2019-03-18 NOTE — Telephone Encounter (Signed)
Spoke with [name] and she stated that she took patient twice last week and they readjusted the balloon. Stated that the second time they put it at 7. Having to change the pad 2-3 times daily. The radiologist told her that the muscle is not pushing the fluid out and suggested a GI referral. Saw Dinah last week and she made a referral for GI (Dr. Henrene Pastor) 04/29/19 to get something to simulate the muscle for her stomach. Daughter is wanting a sooner appointment but unable to get it. She wanted you to know this.

## 2019-03-19 ENCOUNTER — Telehealth: Payer: Self-pay

## 2019-03-19 DIAGNOSIS — K9423 Gastrostomy malfunction: Secondary | ICD-10-CM

## 2019-03-19 DIAGNOSIS — Z934 Other artificial openings of gastrointestinal tract status: Secondary | ICD-10-CM

## 2019-03-19 NOTE — Telephone Encounter (Signed)
Refer to intervention radiology for evaluation of J-tube.

## 2019-03-19 NOTE — Telephone Encounter (Signed)
Thank you :)

## 2019-03-19 NOTE — Telephone Encounter (Signed)
[facility] called and said that they are unable to do the procedure at their office and that patient needs to go to Interventional Radiology.  They cancelled the [facility] appointment.

## 2019-03-19 NOTE — Telephone Encounter (Signed)
Referral to IR has already been intiated. I sent the IR rerral to [facility] bc Dr Henrene Pastor is seeing Shannon [name] 12/17 at 320p to assess the her tube & other GI issues she is having.  I see the appt is still current & hasn't been cancelled. Thanks, Vilinda Blanks.

## 2019-03-20 ENCOUNTER — Telehealth: Payer: Self-pay

## 2019-03-20 ENCOUNTER — Ambulatory Visit: Payer: Medicare Other | Admitting: Internal Medicine

## 2019-03-20 ENCOUNTER — Telehealth: Payer: Self-pay | Admitting: Nurse Practitioner

## 2019-03-20 NOTE — Telephone Encounter (Signed)
Pt has GJ tube that is leaking, she has been to IR for evaluation on 12/2 and 12/8. Per referring office radiology mentioned that there were muscles not working properly in her stomach and that Dr. Henrene Pastor might be able to prescribe medicine to help. Dr. Henrene Pastor please advise if this pt should be seen in the office or should have another IR referral.

## 2019-03-20 NOTE — Telephone Encounter (Signed)
Called referring office and let them know Dr. Blanch Media recommendations.

## 2019-03-20 NOTE — Telephone Encounter (Signed)
IR referral was placed earlier this week.  Lattie Haw do any home health agency's have a wound/ostomy nurse that they could send out to them? At this point I think any nurse may be helpful but not sure if there is the availability

## 2019-03-20 NOTE — Telephone Encounter (Signed)
Unfortunately, no medication for this problem.  IR might need to upsize the tube diameter regarding leaking.  No value in her being seen in this outpatient GI office regarding this particular problem.  It might be helpful if she sees someone like a ostomy nurse for wound care nurse that might be able to put more supportive bandaging around the tube site to minimize leaking.  Thanks

## 2019-03-20 NOTE — Telephone Encounter (Signed)
Shannon [name] with [name] with Dr. Blanch Media office [phone] and she stated that Dr. Henrene Pastor stated that unfortunately there is no medication to help with the problem. Stated that IR [name] need to put a bigger size tube in. Also stated that it [name] be helpful to see a wound or Ostomy Nurse to put more supportive bandage around the tube site to minimize the leaking. Stated that she needs a IR referral.

## 2019-03-20 NOTE — Telephone Encounter (Signed)
Received call from [place] with Dr Henrene Pastor [facility], she stated Ms Diana's appt for 03/20/19 had been cx 03/19/19 by CMA bc they felt she needed to see IR.  I read the note from [place] radiologist regarding stomach issues & that her appt today with Dr Henrene Pastor was urgent for him to address. The IR referral has also been placed this week by Blair Heys, NP & we were waiting for Dr Henrene Pastor to advise from his office visit 03/20/19.  At this time, Jacques Navy will speak to Dr Henrene Pastor going forward.  Thanks, Vilinda Blanks

## 2019-03-21 ENCOUNTER — Other Ambulatory Visit: Payer: Self-pay | Admitting: Cardiology

## 2019-03-21 NOTE — Telephone Encounter (Signed)
Spoke with pts daughter and she is aware of Dr. Blanch Media recommendation, she is aware pt needs to be seen back by IR.

## 2019-03-25 ENCOUNTER — Encounter: Payer: Self-pay | Admitting: Nurse Practitioner

## 2019-03-25 NOTE — Telephone Encounter (Signed)
recvd vm from IR 12/22 stated that [place] tube had been replaced & is appropriately positioned. Leaking from the [place] tube after the pt eats or drinks may be related to delayed or poor gastric emptying.   All info is document on the 03/11/19 IR procedure visit. The vm also stated that Dr Henrene Pastor & Dr Anselm Pancoast may need to consult.  I'll be glad to send referral to [facility] for nurse care. Let me know when the referral is ready.  Thanks, Vilinda Blanks.

## 2019-03-25 NOTE — Telephone Encounter (Signed)
She was seen in office by Dinah, if I add an home health order for nursing will I need to do another face to face since I will be placing the order?

## 2019-04-01 ENCOUNTER — Telehealth: Payer: Self-pay | Admitting: Pulmonary Disease

## 2019-04-01 NOTE — Patient Outreach (Signed)
[facility]) Care Management  [facility] Care Manager  03/17/2019  Shannon [name] 1923-11-10 [id number]   RN Health Coach telephone call to patient.  [employer] compliance verified. Per patient she is weighing but is having problems with her feeding tube. Patient stated that it keeps leaking and they have to change the dressing several times a day. Patient put her daughter on the phone. Per daughter the patient is loosing weight. The tube feedings are over flowing. The patient appetite is very poor. The patient next appointment is January 26 th. The patient has loss 2 lbs within 2 days. RN will call Dr Marina Goodell office and see if patient can be seen sooner. RN received return call from [facility] at Dr Marina Goodell office. Per patty Dr Marina Goodell does not handle the leaking of feeding tubes. Patient needs to go back to interventional radiology or the person that put the tube in for the leaking problem.  RN sent a message to Abbey Chatters  NP and made her aware of the situation. RN received a message back from Abbey Chatters that order placed for interventional radiology. RN Health Coach called the daughter and explained the above. Daughter stated that patient needs to see Dr Marina Goodell for medication for motility. RN explained that patient needs to have the tube checked because it is leaking and that she will be receiving a call for the appointment and can decide if she doesn't want to keep it.  Encounter Medications:  Outpatient Encounter Medications as of 03/17/2019  Medication Sig  . acetaminophen (TYLENOL) 500 MG tablet Take 500 mg by mouth every 6 (six) hours as needed (pain).   . Amino Acids-Protein Hydrolys (FEEDING SUPPLEMENT, PRO-STAT SUGAR FREE 64,) LIQD Take 30 mLs by mouth 2 (two) times daily.  [name] amLODipine (NORVASC) 5 MG tablet Take 1 tablet (5 mg total) by mouth daily.  [name] atorvastatin (LIPITOR) 10 MG tablet Take 1 tablet (10 mg total) by mouth daily.  [name] b complex vitamins tablet Take 1 tablet by  mouth daily.   . calcium-vitamin D (OSCAL WITH D) 500-200 MG-UNIT per tablet Take 1 tablet by mouth daily with breakfast.   . Carboxymethylcellulose Sodium (ARTIFICIAL TEARS OP) Place 1 drop into both eyes 4 (four) times daily.  . collagenase (SANTYL) ointment Apply 1 application topically daily as needed (wound care).   . cycloSPORINE (RESTASIS) 0.05 % ophthalmic emulsion USE 1 DROP INTO BOTH EYES TWICE DAILY  . DULERA 200-5 MCG/ACT AERO TAKE TWO PUFFS BY MOUTH TWICE DAILY  . feeding supplement (OSMOLITE 1 CAL) LIQD Take 711 mLs by mouth See admin instructions. For 12 hours 2100 - 0900  . ferrous sulfate 325 (65 FE) MG tablet TAKE 1 TABLET BY MOUTH EVERY DAY  . fluticasone (FLONASE) 50 MCG/ACT nasal spray SPRAY 2 SPRAYS INTO EACH NOSTRIL EVERY DAY  . furosemide (LASIX) 20 MG tablet Take 20 mg by mouth See admin instructions. Daily unless swelling occurs take 40 mg tablet and then switch back to 20 mg daily  . gabapentin (NEURONTIN) 300 MG capsule TAKE 1 CAPSULE BY MOUTH TWICE A DAY  . gentamicin cream (GARAMYCIN) 0.1 % Apply 1 application topically 3 (three) times daily as needed (dry skin).   . hydrALAZINE (APRESOLINE) 50 MG tablet TAKE 1 TABLET BY MOUTH THREE TIMES A DAY  . insulin aspart (NOVOLOG) 100 UNIT/ML FlexPen Inject 0-6 Units into the skin 3 (three) times daily with meals. New sliding Scale If BS is 151 - 251 = 2 units,  If BS is 251 -  350 = 4 units,  If BS is 351 - 450 = 6 units Call provider if BS runs more than 400  . Insulin Glargine (LANTUS SOLOSTAR) 100 UNIT/ML Solostar Pen Inject 8 Units into the skin at bedtime.  . Insulin Pen Needle (B-D UF III MINI PEN NEEDLES) 31G X 5 MM MISC Use twice daily with giving insulin injections. Dx E11.9  . ipratropium-albuterol (DUONEB) 0.5-2.5 (3) MG/3ML SOLN Take 3 mLs by nebulization every 6 (six) hours as needed (sob). Use 3 times daily x 4 days then every 6 hours as needed.  . isosorbide mononitrate (IMDUR) 30 MG 24 hr tablet TAKE 2 TABLETS  BY MOUTH DAILY. PLEASE MAKE ANNUAL APPT WITH DR. Delton See FOR FUTURE REFILLS. THANK YOU. 1ST ATTEMPT.  . latanoprost (XALATAN) 0.005 % ophthalmic solution Place 1 drop into both eyes at bedtime.  . levalbuterol (XOPENEX HFA) 45 MCG/ACT inhaler Inhale 2 puffs into the lungs every 6 (six) hours as needed for wheezing or shortness of breath.  . loratadine (CLARITIN) 10 MG tablet Take 10 mg by mouth daily as needed for allergies.  . Multiple Vitamin (MULTIVITAMIN WITH MINERALS) TABS tablet Take 1 tablet by mouth daily.  [name] nystatin (MYCOSTATIN/NYSTOP) powder Apply 1 g topically as needed (skin irritation).   [name] oxyCODONE (ROXICODONE) 5 MG immediate release tablet Take 1 tablet (5 mg total) by mouth every 4 (four) hours as needed for severe pain. May take 1/2 tab q4hrs as needed for mild pain.  . OXYGEN Inhale 2 L into the lungs at bedtime.  . pantoprazole (PROTONIX) 40 MG tablet TAKE 1 TABLET BY MOUTH TWICE A DAY  . sertraline (ZOLOFT) 50 MG tablet Take 1 tablet (50 mg total) by mouth daily.  . Water For Irrigation, Sterile (FREE WATER) SOLN Place 150 mLs into feeding tube 3 (three) times daily.   Clif[place]on Asters Petrolatum-Mineral Oil (SYSTANE NIGHTTIME) OINT Place 1 application into both eyes at bedtime.   . [DISCONTINUED] levothyroxine (SYNTHROID, LEVOTHROID) 125 MCG tablet Take 1 tablet (125 mcg total) by mouth daily.  . [DISCONTINUED] warfarin (COUMADIN) 2.5 MG tablet See admin instructions. Take 5mg  by mouth daily, except on Sunday, take 7.5mg  by mouth   No facility-administered encounter medications on file as of 03/17/2019.    Functional Status:  In your present state of health, do you have any difficulty performing the following activities: 03/17/2019 03/02/2019  Hearing? N N  Vision? Y N  Difficulty concentrating or making decisions? N N  Walking or climbing stairs? Y N  Dressing or bathing? Y N  Doing errands, shopping? Malvin Johns  Comment daughter and son in law does errands -  Quarry manager and  eating ? Y -  Comment daughter prepares -  Using the Toilet? N -  In the past six months, have you accidently leaked urine? Y -  Do you have problems with loss of bowel control? N -  Managing your Medications? Y -  Comment daughter handles -  Managing your Finances? Y -  Comment daughter handles -  Housekeeping or managing your Housekeeping? Y -  Comment daughter handles -  Some recent data might be hidden    Fall/Depression Screening: Fall Risk  03/17/2019 03/13/2019 02/12/2019  Falls in the past year? 0 0 0  Number falls in past yr: 0 0 0  Injury with Fall? 0 0 0  Risk for fall due to : - - -  Follow up Falls evaluation completed - -  PHQ 2/9 Scores 10/10/2018 08/07/2018 10/05/2017 02/28/2017 10/25/2016 08/30/2016 05/13/2015  PHQ - 2 Score 0 0 0 0 - 0 0  Exception Documentation - - - - Other- indicate reason in comment box - -  Not completed - - - - call completed with dtr - -    Assessment:  Patient is loosing weight Patient feeding tube is leaking Patient appetite is poor  Plan:  RN notified Dr Marina Goodell office RN sent a message Abbey Chatters NP Abbey Chatters NP sent order for interventional radiology RN made daughter aware RN sent 2021 Calendar book to document weights in RN sent educational material on CHF exacerbation RN will follow up within the month of March  Gean Maidens BSN RN [facility] Care Management [phone]

## 2019-04-01 NOTE — Telephone Encounter (Signed)
Spoke with pt's daughter, she states the pt needs a qualifying walk per [employer]. I called [employer] and they stated she did need an OV and qualifying walk to requalify for oxygen through Medicare. I scheduled the pt to see Dr. Vaughan Browner on 04/10/2018 at 11:00. Nothing further is needed.

## 2019-04-02 ENCOUNTER — Ambulatory Visit (INDEPENDENT_AMBULATORY_CARE_PROVIDER_SITE_OTHER): Payer: Medicare Other | Admitting: Pharmacist

## 2019-04-02 ENCOUNTER — Other Ambulatory Visit: Payer: Self-pay

## 2019-04-02 DIAGNOSIS — Z5181 Encounter for therapeutic drug level monitoring: Secondary | ICD-10-CM | POA: Diagnosis not present

## 2019-04-02 DIAGNOSIS — I4892 Unspecified atrial flutter: Secondary | ICD-10-CM

## 2019-04-02 DIAGNOSIS — I4891 Unspecified atrial fibrillation: Secondary | ICD-10-CM

## 2019-04-02 LAB — POCT INR: INR: 2.2 (ref 2.0–3.0)

## 2019-04-02 NOTE — Patient Instructions (Signed)
Description   Continue taking 2 tablets daily except 3 tablets on Sundays. Recheck in 5 weeks. Call [facility] with any new medications [phone].

## 2019-04-05 IMAGING — CT CT HEAD W/O CM
4 series · 17 of 47 positions shown, 19 images · non-contrast
Comparison: 02/28/2016

CLINICAL DATA: Headache, hypertension

EXAM:
CT HEAD WITHOUT CONTRAST
TECHNIQUE: Contiguous axial images were obtained from the base of the skull
through the vertex without intravenous contrast.

[Series 3: head without · axial · non-contrast · 0.39mm/px · z∈[+1167,+1282]mm · 7 of 31 slices shown, 9 images]
[im 4/31  brain]
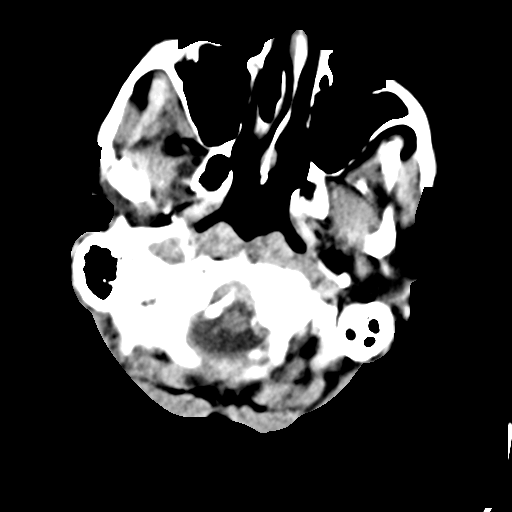
[im 4/31  bone]
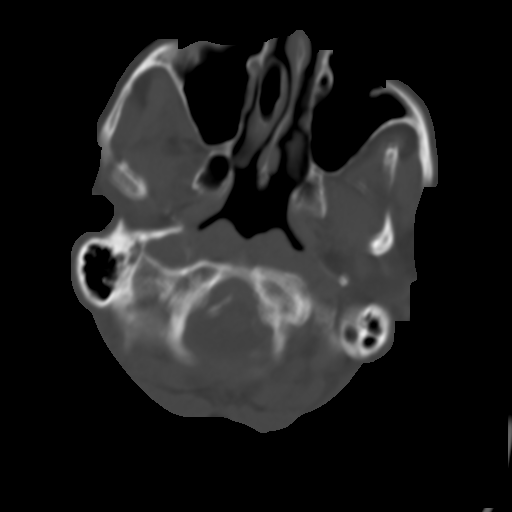
[im 8/31  brain]
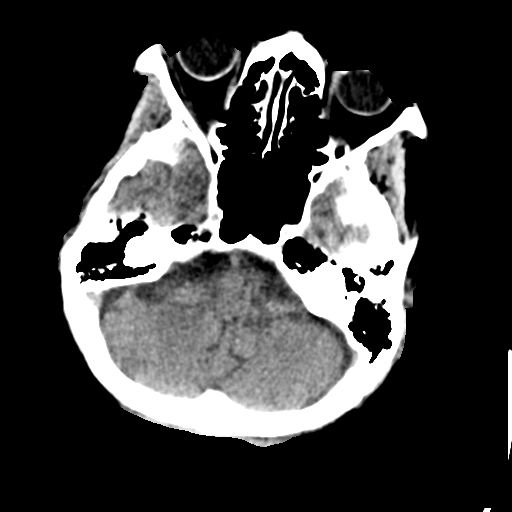
[im 12/31  brain]
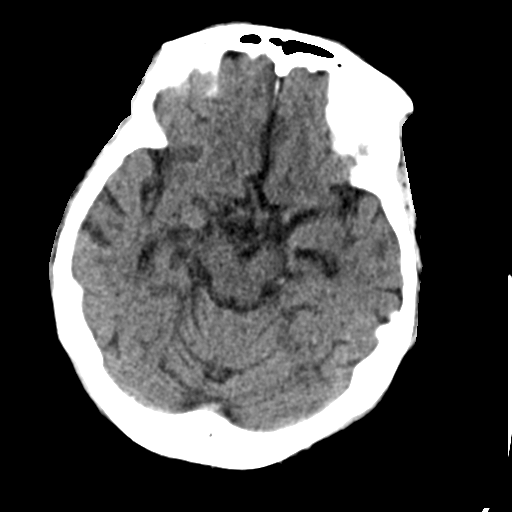
[im 16/31  brain]
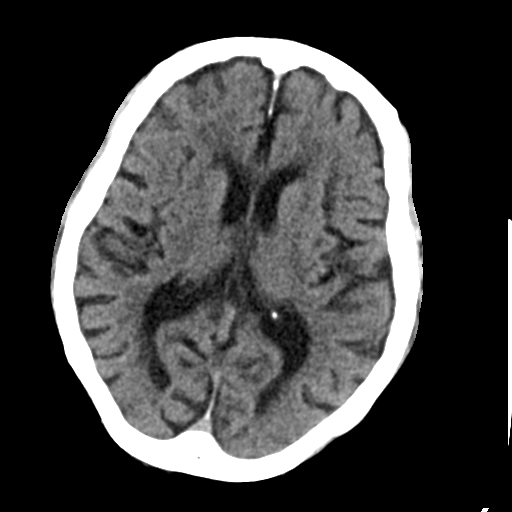
[im 19/31  brain]
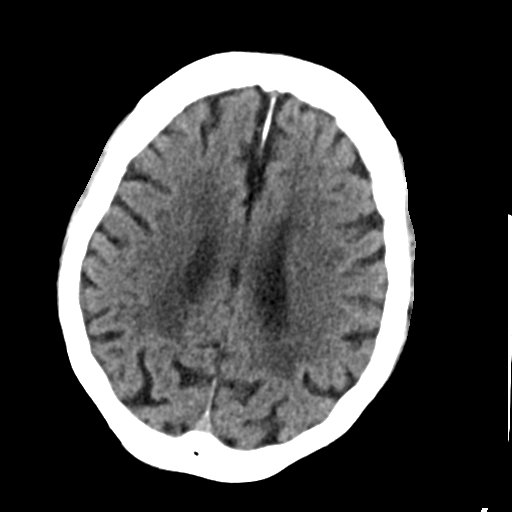
[im 19/31  bone]
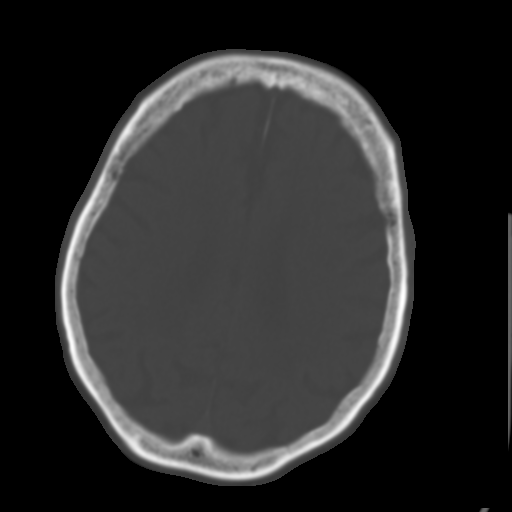
[im 23/31  brain]
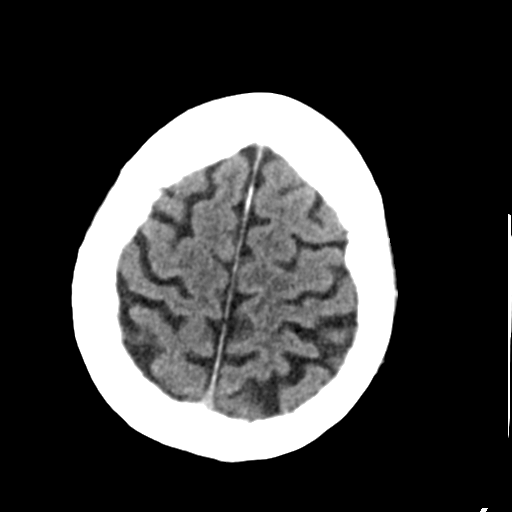
[im 27/31  brain]
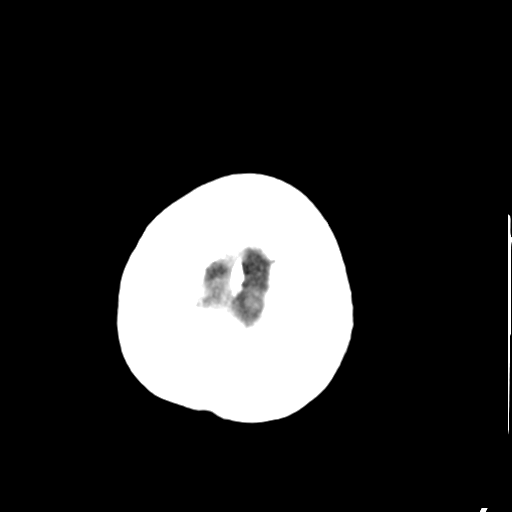

[Series 4: head bone · axial · 0.39mm/px · z∈[+1166,+1220]mm · 4 of 77 slices shown]
[im 8/77  bone]
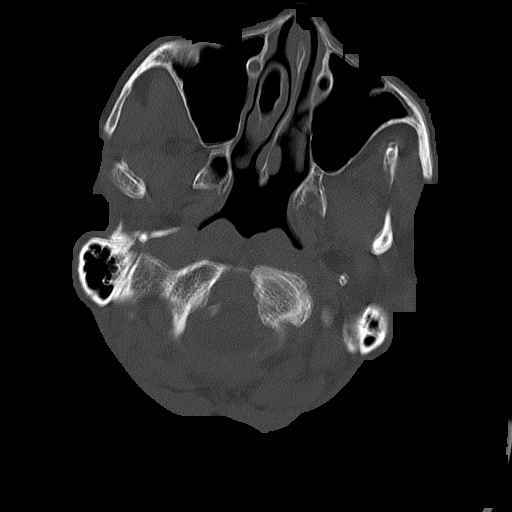
[im 16/77  bone]
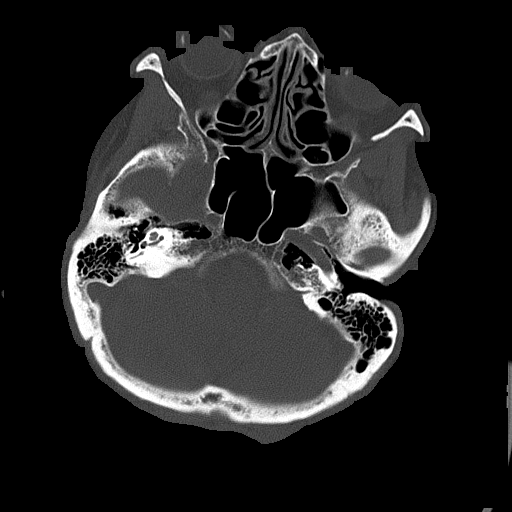
[im 23/77  bone]
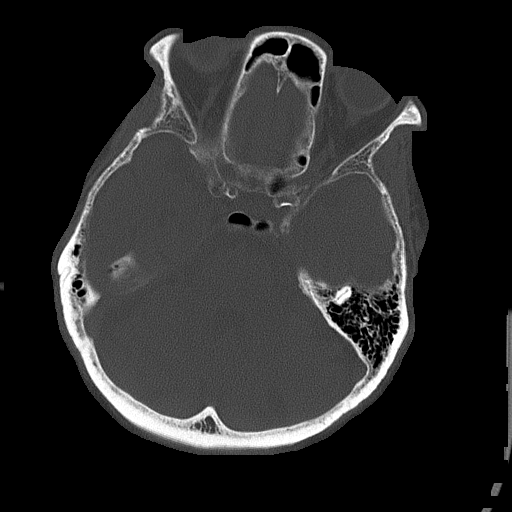
[im 35/77  bone]
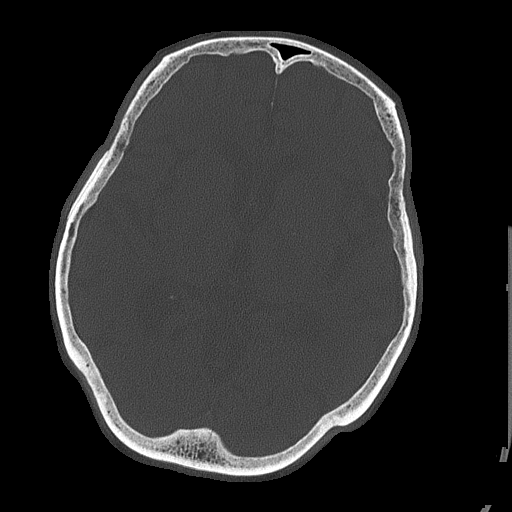

[Series 5: head without cor · coronal · non-contrast · 0.30mm/px · 3 of 67 slices shown]
[im 23/67  brain]
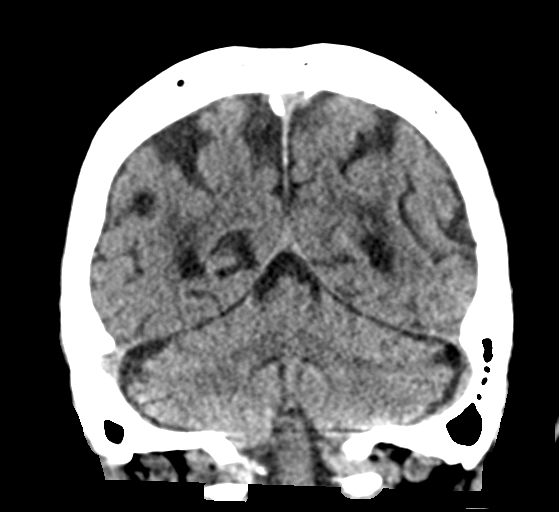
[im 30/67  brain]
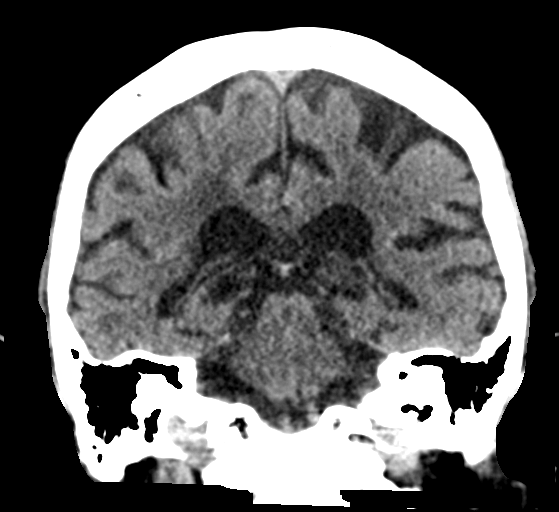
[im 37/67  brain]
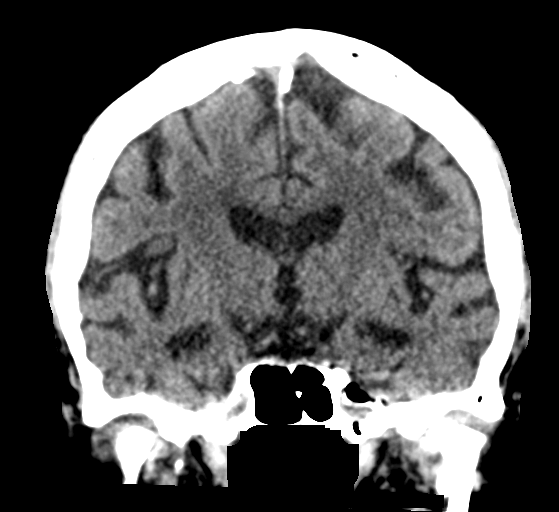

[Series 6: head without sag · sagittal · non-contrast · 0.30mm/px · 3 of 56 slices shown]
[im 19/56  brain]
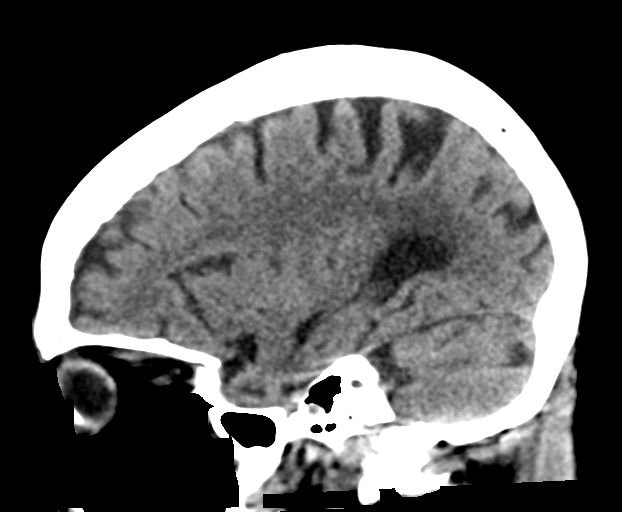
[im 28/56  brain]
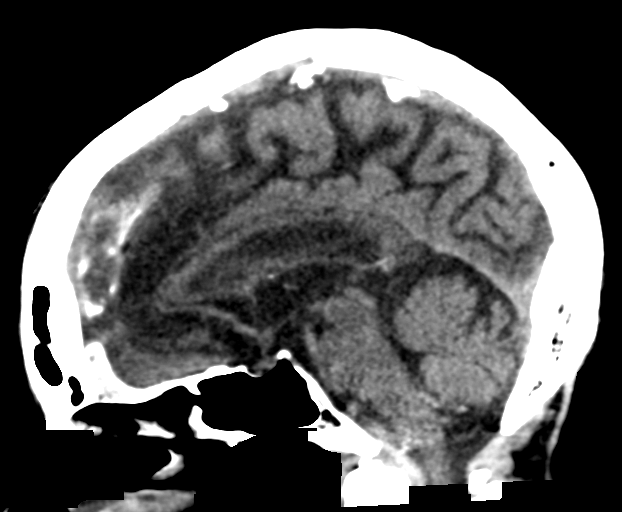
[im 37/56  brain]
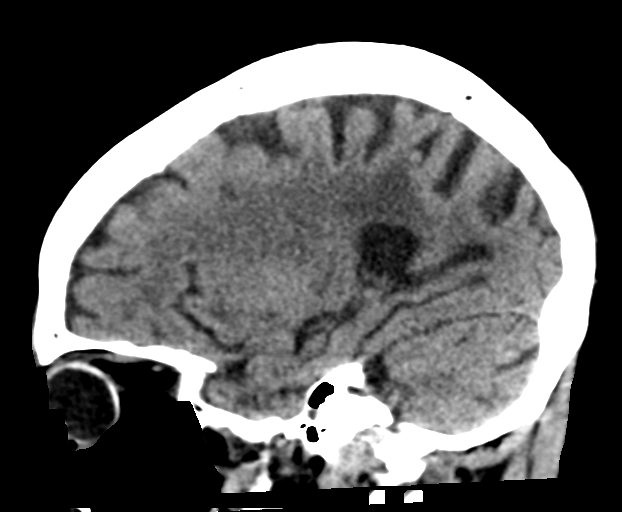

[17 of 47 positions shown; findings below may reference images not displayed]

FINDINGS: Brain: No evidence of acute infarction, hemorrhage, hydrocephalus,
extra-axial collection or mass lesion/mass effect.

Mild cortical atrophy.

Subcortical white matter and periventricular small vessel ischemic
changes.

Vascular: Intracranial atherosclerosis.

Skull: Normal. Negative for fracture or focal lesion.

Sinuses/Orbits: The visualized paranasal sinuses are essentially
clear. The mastoid air cells are unopacified.

Other: None.
IMPRESSION: No evidence of acute intracranial abnormality.

Atrophy with small vessel ischemic changes.

## 2019-04-07 NOTE — Addendum Note (Signed)
Addended byMarlowe Sax C on: 04/07/2019 08:30 AM   Modules accepted: Level of Service

## 2019-04-09 ENCOUNTER — Telehealth: Payer: Self-pay | Admitting: *Deleted

## 2019-04-09 NOTE — Telephone Encounter (Signed)
Received fax from [employer] [facility] for Glucose Testing Supplies. Confirmed with [name] this was requested and it was.  Filled out form and placed in [place] folder to review and sign. To be faxed back to [phone] once completed.

## 2019-04-10 ENCOUNTER — Ambulatory Visit (INDEPENDENT_AMBULATORY_CARE_PROVIDER_SITE_OTHER): Payer: Medicare Other | Admitting: Pulmonary Disease

## 2019-04-10 ENCOUNTER — Other Ambulatory Visit: Payer: Self-pay

## 2019-04-10 ENCOUNTER — Encounter: Payer: Self-pay | Admitting: Pulmonary Disease

## 2019-04-10 VITALS — BP 122/68 | HR 68 | Temp 97.3°F | Ht 66.0 in | Wt 143.6 lb

## 2019-04-10 DIAGNOSIS — J4530 Mild persistent asthma, uncomplicated: Secondary | ICD-10-CM | POA: Diagnosis not present

## 2019-04-10 DIAGNOSIS — G4734 Idiopathic sleep related nonobstructive alveolar hypoventilation: Secondary | ICD-10-CM | POA: Diagnosis not present

## 2019-04-10 NOTE — Addendum Note (Signed)
Addended by: Hildred Alamin I on: 04/10/2019 03:28 PM   Modules accepted: Orders

## 2019-04-10 NOTE — Addendum Note (Signed)
Addended by: Hildred Alamin I on: 04/10/2019 03:33 PM   Modules accepted: Orders

## 2019-04-10 NOTE — Progress Notes (Addendum)
Shannon [name]    [id number]    1923/09/25  Primary Care Physician:Eubanks, Janene Harvey, NP  Referring Physician: Sharon Seller, NP [address] [place]. [place],  [place] [address]  Chief complaint:   Follow up  Mild persistent asthma, eosinophilic inflammation  HPI: Shannon [name] is a 86 year old with complicated medical history including heart failure, chronic kidney disease, diabetes, dysphagia with recurrent aspiration status post PEG tube placement, gastric outlet obstruction. She has been hospitalized multiple times over the past yearwith bradycardia, weakness, rapid atrial flutter, left lower extremity DVT,sepsisdue to UTI. Her last admission was in December 2017 for acute on chronic heart failure. At that time palliative care was consulted. She was discharged home with a referral for home palliative care. She is currently DNR/DNI.   She returns to clinic today with chief complaints of dyspnea on exertion with occasional wheeze. She has occasional cough with mucus production. He denies any fevers, chills. She feels that she is slowly improving. She is currently maintained on DuoNeb which she takes twice daily. She is also on Xopenex neb PRN. She has been prescribed Advair but is not taking it on a regular basis. She is a never smoker with no known exposures.  She was hospitalized earlier this month for symptomatic bradycardia, hypotension secondary to beta blocker and calcium channel blocker. She had to be treated with dopamine and glucagon drip. She was evaluated by cardiology but declined a pacemaker. Her stay was complicated by hospital-acquired pneumonia. Also noted to have 1 out of 2 bottles growing Candida. This was thought to be a contaminant but she was started on Diflucan. Repeat cultures are negative.  Interim history:  Continues on Dulera.  States that breathing is doing well with no new issues.  Outpatient Encounter Medications as of 04/10/2019  Medication Sig  .  acetaminophen (TYLENOL) 500 MG tablet Take 500 mg by mouth every 6 (six) hours as needed (pain).   . Amino Acids-Protein Hydrolys (FEEDING SUPPLEMENT, PRO-STAT SUGAR FREE 64,) LIQD Take 30 mLs by mouth 2 (two) times daily.  [name] amLODipine (NORVASC) 5 MG tablet Take 1 tablet (5 mg total) by mouth daily.  [name] atorvastatin (LIPITOR) 10 MG tablet Take 1 tablet (10 mg total) by mouth daily.  [name] b complex vitamins tablet Take 1 tablet by mouth daily.   . calcium-vitamin D (OSCAL WITH D) 500-200 MG-UNIT per tablet Take 1 tablet by mouth daily with breakfast.   . Carboxymethylcellulose Sodium (ARTIFICIAL TEARS OP) Place 1 drop into both eyes 4 (four) times daily.  . collagenase (SANTYL) ointment Apply 1 application topically daily as needed (wound care).   . cycloSPORINE (RESTASIS) 0.05 % ophthalmic emulsion USE 1 DROP INTO BOTH EYES TWICE DAILY  . DULERA 200-5 MCG/ACT AERO TAKE TWO PUFFS BY MOUTH TWICE DAILY  . feeding supplement (OSMOLITE 1 CAL) LIQD Take 711 mLs by mouth See admin instructions. For 12 hours 2100 - 0900  . ferrous sulfate 325 (65 FE) MG tablet TAKE 1 TABLET BY MOUTH EVERY DAY  . fluticasone (FLONASE) 50 MCG/ACT nasal spray SPRAY 2 SPRAYS INTO EACH NOSTRIL EVERY DAY  . furosemide (LASIX) 20 MG tablet Take 20 mg by mouth See admin instructions. Daily unless swelling occurs take 40 mg tablet and then switch back to 20 mg daily  . gabapentin (NEURONTIN) 300 MG capsule TAKE 1 CAPSULE BY MOUTH TWICE A DAY  . gentamicin cream (GARAMYCIN) 0.1 % Apply 1 application topically 3 (three) times daily  as needed (dry skin).   . hydrALAZINE (APRESOLINE) 50 MG tablet TAKE 1 TABLET BY MOUTH THREE TIMES A DAY  . insulin aspart (NOVOLOG) 100 UNIT/ML FlexPen Inject 0-6 Units into the skin 3 (three) times daily with meals. New sliding Scale If BS is 151 - 251 = 2 units,  If BS is 251 -  350 = 4 units,  If BS is 351 - 450 = 6 units Call provider if BS runs more than 400  . Insulin Glargine (LANTUS SOLOSTAR) 100  UNIT/ML Solostar Pen Inject 8 Units into the skin at bedtime.  . Insulin Pen Needle (B-D UF III MINI PEN NEEDLES) 31G X 5 MM MISC Use twice daily with giving insulin injections. Dx E11.9  . ipratropium-albuterol (DUONEB) 0.5-2.5 (3) MG/3ML SOLN Take 3 mLs by nebulization every 6 (six) hours as needed (sob). Use 3 times daily x 4 days then every 6 hours as needed.  . isosorbide mononitrate (IMDUR) 30 MG 24 hr tablet TAKE 2 TABLETS BY MOUTH DAILY. PLEASE MAKE ANNUAL APPT WITH DR. Delton See FOR FUTURE REFILLS. THANK YOU. 1ST ATTEMPT.  . latanoprost (XALATAN) 0.005 % ophthalmic solution Place 1 drop into both eyes at bedtime.  . levalbuterol (XOPENEX HFA) 45 MCG/ACT inhaler Inhale 2 puffs into the lungs every 6 (six) hours as needed for wheezing or shortness of breath.  . levothyroxine (SYNTHROID) 125 MCG tablet TAKE 1 TABLET BY MOUTH EVERY DAY  . loratadine (CLARITIN) 10 MG tablet Take 10 mg by mouth daily as needed for allergies.  . Multiple Vitamin (MULTIVITAMIN WITH MINERALS) TABS tablet Take 1 tablet by mouth daily.  [name] nystatin (MYCOSTATIN/NYSTOP) powder Apply 1 g topically as needed (skin irritation).   [name] oxyCODONE (ROXICODONE) 5 MG immediate release tablet Take 1 tablet (5 mg total) by mouth every 4 (four) hours as needed for severe pain. May take 1/2 tab q4hrs as needed for mild pain.  . OXYGEN Inhale 2 L into the lungs at bedtime.  . pantoprazole (PROTONIX) 40 MG tablet TAKE 1 TABLET BY MOUTH TWICE A DAY  . sertraline (ZOLOFT) 50 MG tablet Take 1 tablet (50 mg total) by mouth daily.  [name] warfarin (COUMADIN) 2.5 MG tablet Take 2-3 tablets by mouth daily as directed by Coumadin clinic.  [name] Water For Irrigation, Sterile (FREE WATER) SOLN Place 150 mLs into feeding tube 3 (three) times daily.   Cliffton Asters Petrolatum-Mineral Oil (SYSTANE NIGHTTIME) OINT Place 1 application into both eyes at bedtime.    No facility-administered encounter medications on file as of 04/10/2019.   Physical Exam: Gen:      No  acute distress HEENT:  EOMI, sclera anicteric Neck:     No masses; no thyromegaly Lungs:    Clear to auscultation bilaterally; normal respiratory effort CV:         Regular rate and rhythm; no murmurs Abd:      + bowel sounds; soft, non-tender; no palpable masses, no distension Ext:    No edema; adequate peripheral perfusion Skin:      Warm and dry; no rash Neuro: alert and oriented x 3 Psych: normal mood and affect  Data Reviewed: CXR 03/10/16- left lower lobe infiltrate with associated effusion.  CXR 06/18/16- mild bronchitis. Chest x-ray 12/25/2018-no acute cardiopulmonary disease I have reviewed the images personally  Echo 01/28/16 Normal LV systolic function; grade 1 diastolic dysfunction; mild AI; mild MR; moderate LAE.  CBC with differential 11/15/16-WBC 5.5, eosinophils 19%, absolute eosinophil count 1000  Assessment:  Follow up of  dyspnea Dyspnea likely multifactorial secondary to heart failure, chronic kidney disease, volume overload, issues with bradycardia. She has a diagnosis of COPD and asthma but she is a never smoker and does not have symptoms cough, mucus, wheezing. She was unable to complete an FENO or PFTs   CBC noted with peripheral eosinophilia.  Her breathing is responded to [facility] and she continues to be stable since her last visit We will continue the same for now. Continue on nebulizers and xopenex PRN  Requalify for oxygen today.  Return in 1 year  Plan/Recommendations: -Continue Dulera, Xopenex, duo nebs as needed.  Chilton Greathouse MD [facility] 04/10/2019, 2:44 PM  CC: Shannon Seller, NP  Addendum: Overnight oximetry 05/11/2019 Duration of study 10 hours 10 minutes Nadir O2 sat of 90%. No desats noted.  Does not need supplemental oxygen.  Chilton Greathouse MD [facility] 05/28/2019, 1:10 PM

## 2019-04-11 ENCOUNTER — Ambulatory Visit: Payer: Medicare Other | Admitting: Pulmonary Disease

## 2019-04-17 IMAGING — XA IR REPLC DUODEN/JEJUNO TUBE PERCUT W/FLUORO
2 series · 5 of 5 positions shown · non-contrast
Comparison: none

INDICATION: Pain and bleeding at entry site of gastric jejunal feeding tube. The
patient currently has a 22 French balloon retention straight jejunal
catheter entering through the level of prior gastrostomy and
terminating in the jejunum. The tube was last exchanged on
06/28/2016

[Series 1: fl - angio · 4 of 78 frames shown]
[frame 5/78]
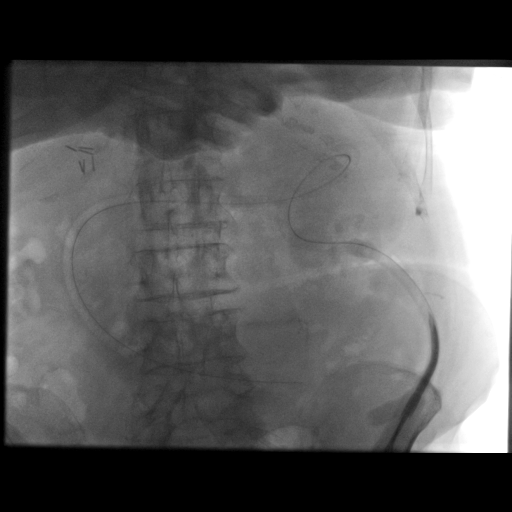
[frame 12/78]
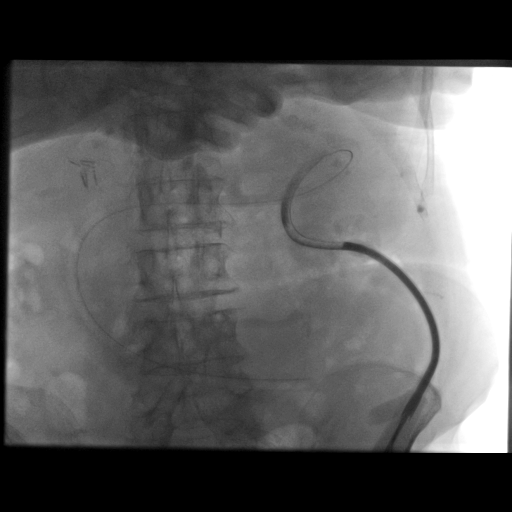
[frame 40/78]
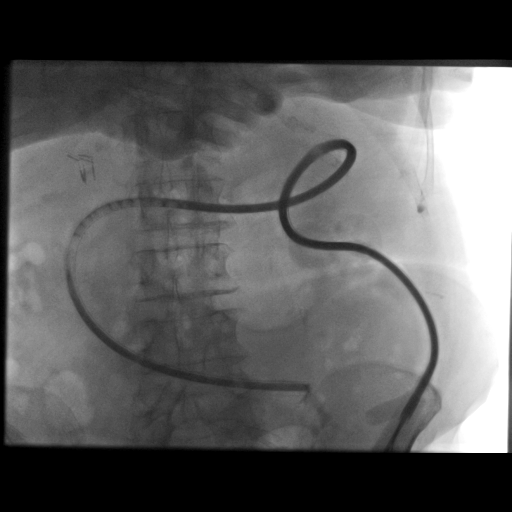
[frame 67/78]
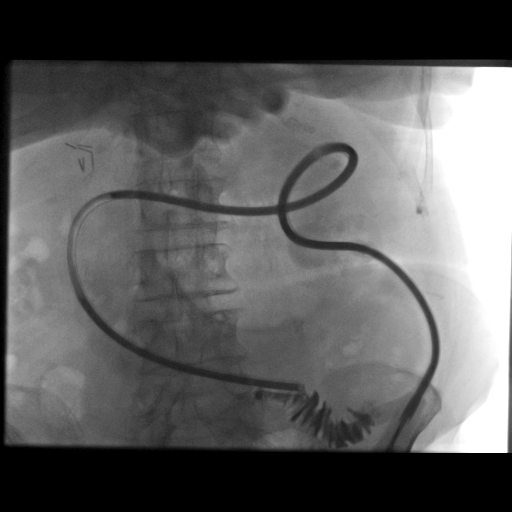

[Series 300: line placements · 1 of 1 slices shown]
[im 1/1]
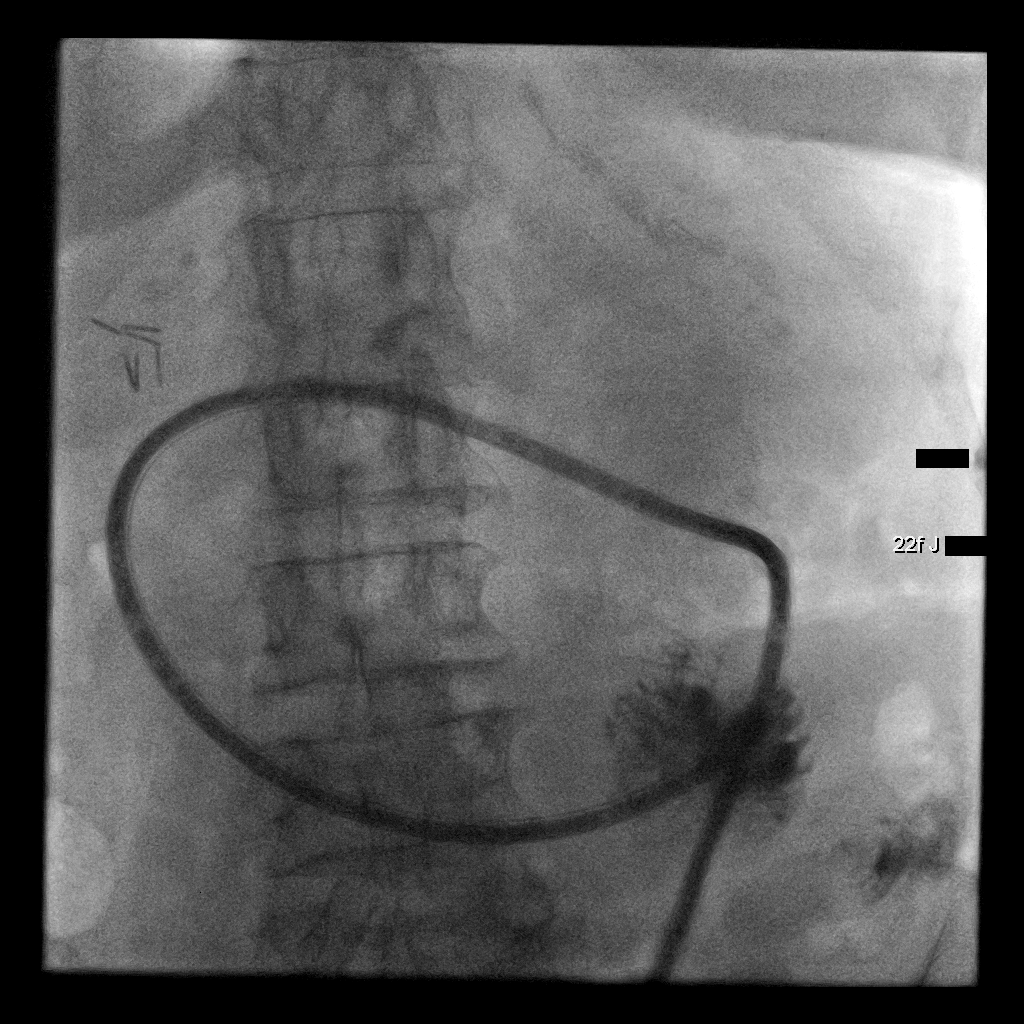

[5 of 5 positions shown; findings below may reference images not displayed]

EXAM:
REPLACEMENT OF GASTROJEJUNAL FEEDING TUBE UNDER FLUOROSCOPY

MEDICATIONS:
None

ANESTHESIA/SEDATION:
None

CONTRAST:  10 mL [id number] - administered into the gastric lumen.

FLUOROSCOPY TIME:  Fluoroscopy Time: 4 minutes and 30 seconds. (52.4
mGy).

COMPLICATIONS:
None immediate.

PROCEDURE:
Informed written consent was obtained from the patient after a
thorough discussion of the procedural risks, benefits and
alternatives. All questions were addressed. Maximal Sterile Barrier
Technique was utilized including caps, mask, sterile gowns, sterile
gloves, sterile drape, hand hygiene and skin antiseptic. A timeout
was performed prior to the initiation of the procedure.

Initial inspection of the tube exit site was performed. Decision was
made to exchange the catheter. The retention balloon was deflated.
The catheter was partially retracted and cut. It was then removed
over a guidewire. The guidewire was further advanced over a 5 French
catheter into the jejunum.

A new 22 French balloon retention jejunostomy catheter was prepared.
Approximately 8 cm was cut off of the end of the catheter. The
catheter was advanced over the guidewire. The retention balloon was
inflated with 8 mL of saline. The catheter was injected with
contrast material and a fluoroscopic spot image obtained to confirm
tube positioning. The catheter was then flushed.
FINDINGS: There was some evidence of some deterioration of the pre-existing
catheter as well as some bleeding around the catheter exit site.
Decision was made to exchange the catheter for a new catheter.
Fluoroscopy demonstrates the previously exchanged catheter to be
partially looped in the stomach with the tip in the proximal
jejunum.

After the catheter was removed over a guidewire, it became evident
that there was some bleeding that appears to be emanating from the
stomach rather than at the level of the skin. It is possible that
the loop of catheter present in the stomach was causing some gastric
irritation. Other etiology of gastric bleed cannot be excluded.

Based on the fluoroscopic appearance of the catheter, the catheter
appears to be slightly too long for the patient's body and therefore
the new catheter was cut shorter in order to fit better to the
patient's anatomy. After exchange, the catheter course is smooth
through the stomach and duodenum with the catheter tip in the
proximal jejunum.
IMPRESSION: Exchange of 22 French balloon retention single lumen gastrojejunal
feeding tube. As above, there was a loop of catheter in the stomach
and when the catheter was exchanged, there does appear to be some
bleeding emanating from the stomach itself. Prior to tube exchange,
the new catheter was cut approximately 8 cm shorter. Catheter course
is now smooth and the catheter tip is in the proximal jejunum.

## 2019-04-23 ENCOUNTER — Ambulatory Visit (INDEPENDENT_AMBULATORY_CARE_PROVIDER_SITE_OTHER): Payer: Medicare Other | Admitting: Podiatry

## 2019-04-23 ENCOUNTER — Other Ambulatory Visit: Payer: Self-pay

## 2019-04-23 VITALS — Temp 97.3°F

## 2019-04-23 DIAGNOSIS — E0859 Diabetes mellitus due to underlying condition with other circulatory complications: Secondary | ICD-10-CM

## 2019-04-23 DIAGNOSIS — L97512 Non-pressure chronic ulcer of other part of right foot with fat layer exposed: Secondary | ICD-10-CM | POA: Diagnosis not present

## 2019-04-23 MED ORDER — GENTAMICIN SULFATE 0.1 % EX CREA: 1.0000 "application " | TOPICAL_CREAM | Freq: Three times a day (TID) | CUTANEOUS | 1 refills | Status: DC | PRN

## 2019-04-27 NOTE — Progress Notes (Signed)
Subjective:  86 y.o. female with PMHx of diabetes mellitus presenting today for follow up evaluation of ulcerations of the right foot. She reports some mild pain from the ulcerations. She denies any modifying factors. She has been using the post op shoe and Betadine as directed. Patient is here for further evaluation and treatment.   Past Medical History:  Diagnosis Date  . Acute respiratory failure ([place])   . Anemia    previous blood transfusions  . Arthritis    "all over"  . Asthma   . Atrial flutter ([place])   . Bradycardia    requiring previous d/c of BB and reduction of amiodarone  . CAD (coronary artery disease)    nonobstructive per notes  . Chronic diastolic CHF (congestive heart failure) ([place])   . CKD (chronic kidney disease), stage III   . Complication of blood transfusion    "got the wrong blood type at [facility] in ~ 2015; no adverse reaction that we are aware of"/daughter, [name] (01/27/2016)  . COPD (chronic obstructive pulmonary disease) ([place])   . Depression    "light case"  . DVT (deep venous thrombosis) ([place]) 01/2016   a. LLE DVT 01/2016 - switched from Eliquis to Coumadin.  [name] Dyspnea    with some activity  . Gastric stenosis    a. s/p stomach tube  . GERD (gastroesophageal reflux disease)   . History of blood transfusion    "several" (01/27/2016)  . History of stomach ulcers   . Hyperlipidemia   . Hypertension   . Hypothyroidism   . On home oxygen therapy    "2L; 9PM til 9AM" (06/27/2017)  . PAD (peripheral artery disease) ([place])    a. prior gangrene, toe amputations, intervention.  [name] PAF (paroxysmal atrial fibrillation) ([place])   . Paraesophageal hernia   . Perforated gastric ulcer ([place])   . Pneumonia    "a few times" (06/27/2017)  . Seasonal allergies   . SIADH (syndrome of inappropriate ADH production) ([place])    [name] 01/10/2015  . Small bowel obstruction ([place])    "I don't know how many" (01/11/2015)  . Stroke [facility])    several- no residual  . Type  II diabetes mellitus ([place])    "related to prednisone use  for > 20 yr; once predinose stopped; no more DM RX" (01/27/2016)  . UTI (urinary tract infection) 02/08/2016  . Ventral hernia with bowel obstruction       Objective/Physical Exam General: The patient is alert and oriented x3 in no acute distress.  Dermatology:  Wounds noted to 2nd and 3rd digits of the right foot, both measuring 0.9 x 0.9 x 0.1 cm (LxWxD).   To the noted ulceration(s), there is no eschar. There is a moderate amount of slough, fibrin, and necrotic tissue noted. Granulation tissue and wound base is red. There is a minimal amount of serosanguineous drainage noted. There is no exposed bone muscle-tendon ligament or joint. There is no malodor. Periwound integrity is intact. Skin is warm, dry and supple bilateral lower extremities.  Vascular: Palpable pedal pulses bilaterally. No edema or erythema noted. Capillary refill within normal limits.  Neurological: Epicritic and protective threshold diminished bilaterally.   Musculoskeletal Exam: Range of motion within normal limits to all pedal and ankle joints bilateral. Muscle strength 5/5 in all groups bilateral.   Assessment: 1. Ulcerations noted to digits 2 and 3 secondary to diabetes mellitus 2. diabetes mellitus w/ peripheral neuropathy   Plan of Care:  1. Patient was evaluated.  2. Medically necessary excisional debridement including muscle and deep fascial tissue was performed using a tissue nipper and a chisel blade. Excisional debridement of all the necrotic nonviable tissue down to healthy bleeding viable tissue was performed with post-debridement measurements same as pre-. 3. The wound was cleansed and dry sterile dressing applied. 4. Discontinue using Betadine.  5. Prescription for Gentamicin cream provided to patient to use daily with a bandage.  6. Continue using post op shoe.  7. Return to clinic in 6 weeks.    Edrick Kins, DPM [facility]  Dr. Edrick Kins, [address]                                        [place], [place] [address]                Office [phone]  Fax [phone]

## 2019-04-29 ENCOUNTER — Ambulatory Visit: Payer: Medicare Other | Admitting: Internal Medicine

## 2019-04-30 ENCOUNTER — Other Ambulatory Visit: Payer: Self-pay | Admitting: Nurse Practitioner

## 2019-04-30 DIAGNOSIS — K922 Gastrointestinal hemorrhage, unspecified: Secondary | ICD-10-CM

## 2019-05-02 ENCOUNTER — Telehealth: Payer: Self-pay | Admitting: Pulmonary Disease

## 2019-05-02 NOTE — Telephone Encounter (Signed)
Spoke with daughter, she is wanting to know when they will be called to do the overnight oximetry. I see the order but unsure where it was sent. Please advise PCC's

## 2019-05-05 NOTE — Telephone Encounter (Signed)
Called [employer] & left vm for Angela.  Called Customer Service # & spoke to [place].  ONO order was placed on 1/7 & confirmation was received from [place] on 1/11.  Elberta Fortis states order is with scheduling team now and looks ready to be scheduled.  He states he will shoot them e-mail to contact pt today to schedule.  If pt hasn't heard from them by 4:30 have pt to call them.  I called & spoke to Mr Tasia Catchings.  He will let his wife (pt's dtr) know.  Told him to let us know if they have any problems.  Nothing further needed.

## 2019-05-05 NOTE — Telephone Encounter (Signed)
I sent order to [employer] on 1/7.  I will call Levada Dy.

## 2019-05-06 ENCOUNTER — Ambulatory Visit (INDEPENDENT_AMBULATORY_CARE_PROVIDER_SITE_OTHER): Payer: Medicare Other | Admitting: Family

## 2019-05-06 ENCOUNTER — Encounter: Payer: Self-pay | Admitting: Family

## 2019-05-06 ENCOUNTER — Other Ambulatory Visit: Payer: Self-pay

## 2019-05-06 DIAGNOSIS — G5793 Unspecified mononeuropathy of bilateral lower limbs: Secondary | ICD-10-CM

## 2019-05-06 NOTE — Progress Notes (Signed)
Patient ID: Shannon [name], female   DOB: 10/10/1923, 86 y.o.   MRN: [id number] This service is provided via telemedicine  No vital signs collected/recorded due to the encounter was a telemedicine visit.   Location of patient (ex: home, work):  HOME  Patient consents to a telephone visit:  YES   Location of the provider (ex: office, home):  OFFICE  Name of any referring provider:  Abbey Chatters, NP  Names of all persons participating in the telemedicine service and their role in the encounter:  DAUGHTER, PATIENT, Mervin Hack, CMA, [facility] Markeem Noreen, NP  Time spent on call: 10 minutes    Provider: Brixton Franko FNP-C  Sharon Seller, NP  Patient Care Team: Sharon Seller, NP as PCP - General (Geriatric Medicine) Lars Masson, MD as PCP - Cardiology (Cardiology) Felecia Shelling, DPM as Consulting Physician (Podiatry) Lars Masson, MD as Consulting Physician (Cardiology) Sharon Seller, NP as Nurse Practitioner (Geriatric Medicine) Gelene Mink, OD as Referring Physician (Optometry) Pleasant, Dennard Schaumann, RN as [facility] Management  Extended Emergency Contact Information Primary Emergency Contact: Armstrong,[name] Address: [address]          [place], [place] [address] [place] Home Phone: [phone] Mobile Phone: [phone] Relation: Daughter Secondary Emergency Contact: Weppler,John  [place] Home Phone: [phone] Relation: Son  Code Status:  Full Code  Goals of care: Advanced Directive information Advanced Directives 03/01/2019  Does Patient Have a Medical Advance Directive? Yes  Type of Advance Directive Healthcare Power of Attorney  Does patient want to make changes to medical advance directive? No - Patient declined  Copy of Healthcare Power of Attorney in Chart? Yes - validated most recent copy scanned in chart (See row information)  Would patient like information on creating a  medical advance directive? -     Chief Complaint  Patient presents with  . Acute Visit    left foot pain for a month or more    HPI:  Pt is a 86 y.o. female seen today for an acute visit for evaluation of foot pain.she states has chronic pain in  Both feet but left foot worst than the right.she describes pain as "  Hurt".Pain keeping her awake at night.she takes Gabapentin 300 mg capsule twice daily.Also Has Oxycodone 5 mg IR tablet though states rarely takes it.She follows up with Podiatrist for right foot ulcer last seen 04/23/2019.No drainage from the wound.Has upcoming appointment 06/04/2019. Daughter present during visit states her brother bought some OTC Hempvana cream pain relief wound like to know if it's okay for her to use cream for pain.I've reviewed cream with patient's daughter unclear if high risk for topical absorption since patient currently on coumadin and cream has salicylate potential for bleeding.Patient's daughter verbalized understanding.   Past Medical History:  Diagnosis Date  . Acute respiratory failure (HCC)   . Anemia    previous blood transfusions  . Arthritis    "all over"  . Asthma   . Atrial flutter (HCC)   . Bradycardia    requiring previous d/c of BB and reduction of amiodarone  . CAD (coronary artery disease)    nonobstructive per notes  . Chronic diastolic CHF (congestive heart failure) (HCC)   . CKD (chronic kidney disease), stage III   . Complication of blood transfusion    "got the wrong blood type at [facility] in ~ 2015; no adverse reaction that we are aware of"/daughter, [name] (  01/27/2016)  . COPD (chronic obstructive pulmonary disease) (HCC)   . Depression    "light case"  . DVT (deep venous thrombosis) (HCC) 01/2016   a. LLE DVT 01/2016 - switched from Eliquis to Coumadin.  [name] Dyspnea    with some activity  . Gastric stenosis    a. s/p stomach tube  . GERD (gastroesophageal reflux disease)   . History of blood transfusion    "several"  (01/27/2016)  . History of stomach ulcers   . Hyperlipidemia   . Hypertension   . Hypothyroidism   . On home oxygen therapy    "2L; 9PM til 9AM" (06/27/2017)  . PAD (peripheral artery disease) (HCC)    a. prior gangrene, toe amputations, intervention.  [name] PAF (paroxysmal atrial fibrillation) (HCC)   . Paraesophageal hernia   . Perforated gastric ulcer (HCC)   . Pneumonia    "a few times" (06/27/2017)  . Seasonal allergies   . SIADH (syndrome of inappropriate ADH production) (HCC)    [name] 01/10/2015  . Small bowel obstruction (HCC)    "I don't know how many" (01/11/2015)  . Stroke [facility])    several- no residual  . Type II diabetes mellitus (HCC)    "related to prednisone use  for > 20 yr; once predinose stopped; no more DM RX" (01/27/2016)  . UTI (urinary tract infection) 02/08/2016  . Ventral hernia with bowel obstruction    Past Surgical History:  Procedure Laterality Date  . ABDOMINAL AORTOGRAM N/A 09/21/2016   Procedure: Abdominal Aortogram;  Surgeon: Maeola Harman, MD;  Location[facility] INVASIVE CV LAB;  Service: Cardiovascular;  Laterality: N/A;  . AMPUTATION Left 09/25/2016   Procedure: AMPUTATION DIGIT- LEFT 2ND AND 3RD TOES;  Surgeon: Larina Earthly, MD;  Location: [facility] OR;  Service: Vascular;  Laterality: Left;  . AMPUTATION Right 05/03/2018   Procedure: AMPUTATION RIGHT GREAT TOE;  Surgeon: Larina Earthly, MD;  Location: [facility] OR;  Service: Vascular;  Laterality: Right;  . CATARACT EXTRACTION W/ INTRAOCULAR LENS  IMPLANT, BILATERAL    . CHOLECYSTECTOMY OPEN    . COLECTOMY    . ESOPHAGOGASTRODUODENOSCOPY N/A 01/19/2014   Procedure: ESOPHAGOGASTRODUODENOSCOPY (EGD);  Surgeon: Hilarie Fredrickson, MD;  Location: [facility]COPY;  Service: Endoscopy;  Laterality: N/A;  . ESOPHAGOGASTRODUODENOSCOPY N/A 01/20/2014   Procedure: ESOPHAGOGASTRODUODENOSCOPY (EGD);  Surgeon: Hilarie Fredrickson, MD;  Location: [facility];  Service: Endoscopy;  Laterality: N/A;  . ESOPHAGOGASTRODUODENOSCOPY N/A  03/19/2014   Procedure: ESOPHAGOGASTRODUODENOSCOPY (EGD);  Surgeon: Rachael Fee, MD;  Location: [facility];  Service: Endoscopy;  Laterality: N/A;  . ESOPHAGOGASTRODUODENOSCOPY N/A 07/08/2015   Procedure: ESOPHAGOGASTRODUODENOSCOPY (EGD);  Surgeon: Sherrilyn Rist, MD;  Location: [facility] ENDOSCOPY;  Service: Endoscopy;  Laterality: N/A;  . ESOPHAGOGASTRODUODENOSCOPY (EGD) WITH PROPOFOL N/A 09/15/2015   Procedure: ESOPHAGOGASTRODUODENOSCOPY (EGD) WITH PROPOFOL;  Surgeon: Ruffin Frederick, MD;  Location: [facility] ENDOSCOPY;  Service: Gastroenterology;  Laterality: N/A;  . GASTROJEJUNOSTOMY     hx/notes 01/10/2015  . GASTROJEJUNOSTOMY N/A 09/23/2015   Procedure: OPEN GASTROJEJUNOSTOMY TUBE PLACEMENT;  Surgeon: De Blanch Kinsinger, MD;  Location: [facility] ORS;  Service: General;  Laterality: N/A;  . GLAUCOMA SURGERY Bilateral   . HERNIA REPAIR  2015  . IR CM INJ ANY COLONIC TUBE W/FLUORO  01/05/2017  . IR CM INJ ANY COLONIC TUBE W/FLUORO  06/07/2017  . IR CM INJ ANY COLONIC TUBE W/FLUORO  11/05/2017  . IR CM INJ ANY COLONIC TUBE W/FLUORO  09/18/2018  . IR CM INJ ANY COLONIC TUBE  W/FLUORO  03/06/2019  . IR CM INJ ANY COLONIC TUBE W/FLUORO  03/11/2019  . IR GENERIC HISTORICAL  01/07/2016   IR GJ TUBE CHANGE 01/07/2016 Malachy Moan, MD [facility]  . IR GENERIC HISTORICAL  01/27/2016   IR MECH REMOV OBSTRUC MAT ANY COLON TUBE W/FLUORO 01/27/2016 Richarda Overlie, MD [facility]  . IR GENERIC HISTORICAL  02/07/2016   IR PATIENT EVAL TECH 0-60 MINS Darrell K Allred, PA-C [facility]  . IR GENERIC HISTORICAL  02/08/2016   IR GJ TUBE CHANGE 02/08/2016 Berdine Dance, MD [facility]  . IR GENERIC HISTORICAL  01/06/2016   IR GJ TUBE CHANGE 01/06/2016 [facility] OUT REF  . IR GENERIC HISTORICAL  05/02/2016   IR CM INJ ANY COLONIC TUBE W/FLUORO 05/02/2016 Oley Balm, MD [facility]  . IR GENERIC HISTORICAL  05/15/2016   IR GJ TUBE CHANGE 05/15/2016 Simonne Come, MD [facility]  . IR GENERIC HISTORICAL  06/28/2016   IR  GJ TUBE CHANGE 06/28/2016 [facility]  . IR GJ TUBE CHANGE  02/20/2017  . IR GJ TUBE CHANGE  05/10/2017  . IR GJ TUBE CHANGE  07/06/2017  . IR GJ TUBE CHANGE  08/02/2017  . IR GJ TUBE CHANGE  10/15/2017  . IR GJ TUBE CHANGE  12/26/2017  . IR GJ TUBE CHANGE  04/08/2018  . IR GJ TUBE CHANGE  06/19/2018  . IR GJ TUBE CHANGE  03/03/2019  . IR PATIENT EVAL TECH 0-60 MINS  10/19/2016  . IR PATIENT EVAL TECH 0-60 MINS  12/25/2016  . IR PATIENT EVAL TECH 0-60 MINS  01/29/2017  . IR PATIENT EVAL TECH 0-60 MINS  04/04/2017  . IR PATIENT EVAL TECH 0-60 MINS  04/30/2017  . IR PATIENT EVAL TECH 0-60 MINS  07/31/2017  . IR REPLC DUODEN/JEJUNO TUBE PERCUT W/FLUORO  11/14/2016  . LAPAROTOMY N/A 01/20/2015   Procedure: EXPLORATORY LAPAROTOMY;  Surgeon: Abigail Miyamoto, MD;  Location: [facility] OR;  Service: General;  Laterality: N/A;  . LOWER EXTREMITY ANGIOGRAPHY Left 09/21/2016   Procedure: Lower Extremity Angiography;  Surgeon: Maeola Harman, MD;  Location: [facility] INVASIVE CV LAB;  Service: Cardiovascular;  Laterality: Left;  . LOWER EXTREMITY ANGIOGRAPHY Right 06/27/2017   Procedure: Lower Extremity Angiography;  Surgeon: Maeola Harman, MD;  Location: [facility] INVASIVE CV LAB;  Service: Cardiovascular;  Laterality: Right;  . LYSIS OF ADHESION N/A 01/20/2015   Procedure: LYSIS OF ADHESIONS < 1 HOUR;  Surgeon: Abigail Miyamoto, MD;  Location: [facility] OR;  Service: General;  Laterality: N/A;  . PERIPHERAL VASCULAR BALLOON ANGIOPLASTY Left 09/21/2016   Procedure: Peripheral Vascular Balloon Angioplasty;  Surgeon: Maeola Harman, MD;  Location: [facility] INVASIVE CV LAB;  Service: Cardiovascular;  Laterality: Left;  drug coated balloon  . PERIPHERAL VASCULAR BALLOON ANGIOPLASTY Right 06/27/2017   Procedure: PERIPHERAL VASCULAR BALLOON ANGIOPLASTY;  Surgeon: Maeola Harman, MD;  Location: [facility] INVASIVE CV LAB;  Service: Cardiovascular;  Laterality: Right;  SFA/TPTRUNK  . TONSILLECTOMY    . TUBAL LIGATION    .  VENTRAL HERNIA REPAIR  2015   incarcerated ventral hernia ([facility] 09/2013)/notes 01/10/2015    Allergies  Allergen Reactions  . Penicillins Itching, Rash and Other (See Comments)    Tolerated amoxicillin, unasyn, zosyn & cephalosporins in the past. Did it involve swelling of the face/tongue/throat, SOB, or low BP? No Did it involve sudden or severe rash/hives, skin peeling, or any reaction on the inside of your mouth or nose? No Did you need to seek medical attention at  a hospital or doctor's office? Unknown When did it last happen?5+ years If all above answers are "NO", may proceed with cephalosporin use.     Outpatient Encounter Medications as of 05/06/2019  Medication Sig  . acetaminophen (TYLENOL) 500 MG tablet Take 500 mg by mouth every 6 (six) hours as needed (pain).   . Amino Acids-Protein Hydrolys (FEEDING SUPPLEMENT, PRO-STAT SUGAR FREE 64,) LIQD Take 30 mLs by mouth 2 (two) times daily.  [name] amLODipine (NORVASC) 5 MG tablet Take 1 tablet (5 mg total) by mouth daily.  [name] atorvastatin (LIPITOR) 10 MG tablet Take 1 tablet (10 mg total) by mouth daily.  [name] b complex vitamins tablet Take 1 tablet by mouth daily.   . calcium-vitamin D (OSCAL WITH D) 500-200 MG-UNIT per tablet Take 1 tablet by mouth daily with breakfast.   . Carboxymethylcellulose Sodium (ARTIFICIAL TEARS OP) Place 1 drop into both eyes 4 (four) times daily.  . collagenase (SANTYL) ointment Apply 1 application topically daily as needed (wound care).   . cycloSPORINE (RESTASIS) 0.05 % ophthalmic emulsion USE 1 DROP INTO BOTH EYES TWICE DAILY  . DULERA 200-5 MCG/ACT AERO TAKE TWO PUFFS BY MOUTH TWICE DAILY  . feeding supplement (OSMOLITE 1 CAL) LIQD Take 711 mLs by mouth See admin instructions. For 12 hours 2100 - 0900  . ferrous sulfate 325 (65 FE) MG tablet TAKE 1 TABLET BY MOUTH EVERY DAY  . fluticasone (FLONASE) 50 MCG/ACT nasal spray SPRAY 2 SPRAYS INTO EACH NOSTRIL EVERY DAY  . furosemide (LASIX) 20 MG tablet Take 20  mg by mouth See admin instructions. Daily unless swelling occurs take 40 mg tablet and then switch back to 20 mg daily  . gabapentin (NEURONTIN) 300 MG capsule TAKE 1 CAPSULE BY MOUTH TWICE A DAY  . gentamicin cream (GARAMYCIN) 0.1 % Apply 1 application topically 3 (three) times daily as needed (dry skin).  . hydrALAZINE (APRESOLINE) 50 MG tablet TAKE 1 TABLET BY MOUTH THREE TIMES A DAY  . insulin aspart (NOVOLOG) 100 UNIT/ML FlexPen Inject 0-6 Units into the skin 3 (three) times daily with meals. New sliding Scale If BS is 151 - 251 = 2 units,  If BS is 251 -  350 = 4 units,  If BS is 351 - 450 = 6 units Call provider if BS runs more than 400  . Insulin Glargine (LANTUS SOLOSTAR) 100 UNIT/ML Solostar Pen Inject 8 Units into the skin at bedtime.  . Insulin Pen Needle (B-D UF III MINI PEN NEEDLES) 31G X 5 MM MISC Use twice daily with giving insulin injections. Dx E11.9  . ipratropium-albuterol (DUONEB) 0.5-2.5 (3) MG/3ML SOLN Take 3 mLs by nebulization every 6 (six) hours as needed (sob). Use 3 times daily x 4 days then every 6 hours as needed.  . isosorbide mononitrate (IMDUR) 30 MG 24 hr tablet TAKE 2 TABLETS BY MOUTH DAILY. PLEASE MAKE ANNUAL APPT WITH DR. Delton See FOR FUTURE REFILLS. THANK YOU. 1ST ATTEMPT.  . latanoprost (XALATAN) 0.005 % ophthalmic solution Place 1 drop into both eyes at bedtime.  . levalbuterol (XOPENEX HFA) 45 MCG/ACT inhaler Inhale 2 puffs into the lungs every 6 (six) hours as needed for wheezing or shortness of breath.  . levothyroxine (SYNTHROID) 125 MCG tablet TAKE 1 TABLET BY MOUTH EVERY DAY  . loratadine (CLARITIN) 10 MG tablet Take 10 mg by mouth daily as needed for allergies.  . Multiple Vitamin (MULTIVITAMIN WITH MINERALS) TABS tablet Take 1 tablet by mouth daily.  [name] nystatin (MYCOSTATIN/NYSTOP) powder Apply  1 g topically as needed (skin irritation).   [name] oxyCODONE (ROXICODONE) 5 MG immediate release tablet Take 1 tablet (5 mg total) by mouth every 4 (four) hours as  needed for severe pain. May take 1/2 tab q4hrs as needed for mild pain.  . OXYGEN Inhale 2 L into the lungs at bedtime.  . pantoprazole (PROTONIX) 40 MG tablet TAKE 1 TABLET BY MOUTH TWICE A DAY  . sertraline (ZOLOFT) 50 MG tablet Take 1 tablet (50 mg total) by mouth daily.  . Vitamins/Minerals TABS Take by mouth.  . warfarin (COUMADIN) 2.5 MG tablet Take 2-3 tablets by mouth daily as directed by Coumadin clinic.  [name] Water For Irrigation, Sterile (FREE WATER) SOLN Place 150 mLs into feeding tube 3 (three) times daily.   Cliffton Asters Petrolatum-Mineral Oil (SYSTANE NIGHTTIME) OINT Place 1 application into both eyes at bedtime.    No facility-administered encounter medications on file as of 05/06/2019.    Review of Systems  Constitutional: Negative for appetite change, chills, fatigue and fever.  Respiratory: Negative for cough, chest tightness, shortness of breath and wheezing.   Cardiovascular: Negative for chest pain, palpitations and leg swelling.  Musculoskeletal: Positive for arthralgias and gait problem.  Skin: Positive for wound. Negative for color change, pallor and rash.       Right foot ulcer following up with Podiatrist.no drainage or redness reported.   Neurological: Negative for dizziness, seizures, speech difficulty, weakness, light-headedness and headaches.  Hematological: Does not bruise/bleed easily.  Psychiatric/Behavioral: Negative for agitation and confusion. The patient is not nervous/anxious.     Immunization History  Administered Date(s) Administered  . Fluad Quad(high Dose 65+) 12/02/2018  . Influenza, High Dose Seasonal PF 01/19/2017  . Influenza,inj,Quad PF,6+ Mos 12/24/2013, 01/27/2015, 12/29/2015, 12/04/2017  . Influenza-Unspecified 12/02/2012  . Pneumococcal Conjugate-13 02/03/2014  . Pneumococcal Polysaccharide-23 08/31/2016  . Tdap 07/18/2016  . Zoster 04/12/2011   Pertinent  Health Maintenance Due  Topic Date Due  . FOOT EXAM  08/22/2017  . HEMOGLOBIN A1C   08/30/2019  . OPHTHALMOLOGY EXAM  12/16/2019  . INFLUENZA VACCINE  Completed  . DEXA SCAN  Completed  . PNA vac Low Risk Adult  Completed   Fall Risk  05/06/2019 03/17/2019 03/13/2019 02/12/2019 10/10/2018  Falls in the past year? 0 0 0 0 0  Number falls in past yr: 0 0 0 0 0  Injury with Fall? 0 0 0 0 0  Risk for fall due to : - - - - -  Follow up - Falls evaluation completed - - -    There were no vitals filed for this visit. There is no height or weight on file to calculate BMI. Physical Exam  Unable to complete on telephone visit.   Labs reviewed: Recent Labs    03/01/19 2145 03/01/19 2145 03/02/19 0242 03/02/19 0731 03/03/19 0355  NA 136  --   --  136 139  K 4.4  --   --  4.1 4.3  CL 102  --   --  105 108  CO2 25  --   --  24 23  GLUCOSE 91  --   --  93 95  BUN 60*  --   --  52* 40*  CREATININE 1.46*   < > 1.43* 1.39* 1.23*  CALCIUM 9.5  --   --  9.1 9.1   < > = values in this interval not displayed.   Recent Labs    11/24/18 2036 12/25/18 1518 12/26/18 0533  AST  32 27 24  ALT 20 20 19   ALKPHOS 71 87 84  BILITOT 0.5 0.5 0.6  PROT 7.6 7.9 7.4  ALBUMIN 3.8 3.8 3.5   Recent Labs    11/24/18 2036 11/25/18 0530 12/02/18 1555 12/25/18 1518 03/01/19 2145 03/01/19 2145 03/02/19 0242 03/02/19 0731 03/03/19 0355  WBC 6.5   < > 5.7   < > 6.0   < > 5.2 6.3 4.6  NEUTROABS 4.2  --  3,631  --  3.6  --   --   --   --   HGB 9.6*   < > 9.6*   < > 9.4*   < > 9.2* 9.8* 9.0*  HCT 29.7*   < > 29.2*   < > 29.1*   < > 28.6* 30.0* 27.5*  MCV 97.4   < > 94.8   < > 93.9   < > 93.8 94.9 93.5  PLT 201   < > 225   < > 228   < > 214 224 217   < > = values in this interval not displayed.   Lab Results  Component Value Date   TSH 0.98 10/15/2018   Lab Results  Component Value Date   HGBA1C 6.2 (H) 03/02/2019   Lab Results  Component Value Date   CHOL 131 10/15/2018   HDL 47 (L) 10/15/2018   LDLCALC 58 10/15/2018   TRIG 188 (H) 10/15/2018   CHOLHDL 2.8 10/15/2018     Significant Diagnostic Results in last 30 days:  No results found.  Assessment/Plan   Neuropathic pain of both feet Reports left foot pain worst than right foot at night.will continue on Gabapentin 300 mg tablet twice daily.Has Oxycodone 5 mg tablet one by mouth every 4 hours as needed though rarely takes it.Advised to take Oxycodone 5 mg tablet one by mouth during the night for pain.Discussed also at length high risk of using Hempvana analgesic Cream due to possible interference with her coumadin.Patient's daughter verbalized understanding.continue to follow up with Podiatrist as directed. - Advised to Notify provider if pain not control or worsen.       Family/ staff Communication: Reviewed plan of care with patient  Labs/tests ordered: None   Next Appointment: Has upcoming appointment  06/11/2019 for 4 months Follow with PCP   Spent 18 minutes of non-face to face with patient   Caesar Bookman, NP

## 2019-05-07 ENCOUNTER — Ambulatory Visit (INDEPENDENT_AMBULATORY_CARE_PROVIDER_SITE_OTHER): Payer: Medicare Other

## 2019-05-07 ENCOUNTER — Other Ambulatory Visit: Payer: Self-pay

## 2019-05-07 DIAGNOSIS — Z5181 Encounter for therapeutic drug level monitoring: Secondary | ICD-10-CM | POA: Diagnosis not present

## 2019-05-07 DIAGNOSIS — I4892 Unspecified atrial flutter: Secondary | ICD-10-CM | POA: Diagnosis not present

## 2019-05-07 DIAGNOSIS — I4891 Unspecified atrial fibrillation: Secondary | ICD-10-CM

## 2019-05-07 LAB — POCT INR: INR: 1.4 — AB (ref 2.0–3.0)

## 2019-05-07 NOTE — Patient Instructions (Signed)
Description   Take 3 tablets today, then start taking 2 tablets daily except 3 tablets on Sundays and Thursdays. Recheck in 10 days. Call Coumadin Clinic with any new medications [phone].

## 2019-05-09 ENCOUNTER — Other Ambulatory Visit: Payer: Self-pay | Admitting: Adult Health

## 2019-05-09 DIAGNOSIS — F32A Depression, unspecified: Secondary | ICD-10-CM

## 2019-05-09 DIAGNOSIS — F329 Major depressive disorder, single episode, unspecified: Secondary | ICD-10-CM

## 2019-05-11 DIAGNOSIS — R0902 Hypoxemia: Secondary | ICD-10-CM | POA: Diagnosis not present

## 2019-05-11 DIAGNOSIS — J449 Chronic obstructive pulmonary disease, unspecified: Secondary | ICD-10-CM | POA: Diagnosis not present

## 2019-05-16 ENCOUNTER — Ambulatory Visit (INDEPENDENT_AMBULATORY_CARE_PROVIDER_SITE_OTHER): Payer: Medicare Other

## 2019-05-16 ENCOUNTER — Other Ambulatory Visit: Payer: Self-pay

## 2019-05-16 ENCOUNTER — Telehealth: Payer: Self-pay | Admitting: Pulmonary Disease

## 2019-05-16 DIAGNOSIS — G4734 Idiopathic sleep related nonobstructive alveolar hypoventilation: Secondary | ICD-10-CM

## 2019-05-16 DIAGNOSIS — Z5181 Encounter for therapeutic drug level monitoring: Secondary | ICD-10-CM

## 2019-05-16 DIAGNOSIS — I4891 Unspecified atrial fibrillation: Secondary | ICD-10-CM

## 2019-05-16 DIAGNOSIS — I4892 Unspecified atrial flutter: Secondary | ICD-10-CM | POA: Diagnosis not present

## 2019-05-16 LAB — POCT INR: INR: 2.1 (ref 2.0–3.0)

## 2019-05-16 NOTE — Patient Instructions (Signed)
Description   Continue on same dosage 2 tablets daily except 3 tablets on Sundays and Thursdays. Recheck in 2 weeks. Call Coumadin Clinic with any new medications [phone].

## 2019-05-16 NOTE — Telephone Encounter (Signed)
Yes we have received the ONO results and they are in Dr. Matilde Bash look at folder and he will review it when he returns to the office on 05/26/19

## 2019-05-16 NOTE — Telephone Encounter (Signed)
I spoke with a rep from [facility] and he states the ONO showed the patient doesn't need the oxygen at night anymore. Erica, did Dr. Vaughan Browner receive this report. Please advise if we need to send d/c order for oxygen.

## 2019-05-18 ENCOUNTER — Observation Stay (HOSPITAL_COMMUNITY): Payer: Medicare Other

## 2019-05-18 ENCOUNTER — Emergency Department (HOSPITAL_COMMUNITY): Payer: Medicare Other

## 2019-05-18 ENCOUNTER — Other Ambulatory Visit: Payer: Self-pay

## 2019-05-18 ENCOUNTER — Observation Stay (HOSPITAL_COMMUNITY)
Admission: EM | Admit: 2019-05-18 | Discharge: 2019-05-19 | Disposition: A | Discharge status: Home / Self Care | Payer: Medicare Other | Attending: Internal Medicine | Admitting: Internal Medicine

## 2019-05-18 ENCOUNTER — Encounter (HOSPITAL_COMMUNITY): Payer: Self-pay

## 2019-05-18 DIAGNOSIS — Z9049 Acquired absence of other specified parts of digestive tract: Secondary | ICD-10-CM | POA: Insufficient documentation

## 2019-05-18 DIAGNOSIS — R0789 Other chest pain: Secondary | ICD-10-CM | POA: Diagnosis not present

## 2019-05-18 DIAGNOSIS — Z934 Other artificial openings of gastrointestinal tract status: Secondary | ICD-10-CM | POA: Insufficient documentation

## 2019-05-18 DIAGNOSIS — Z7951 Long term (current) use of inhaled steroids: Secondary | ICD-10-CM | POA: Insufficient documentation

## 2019-05-18 DIAGNOSIS — I2510 Atherosclerotic heart disease of native coronary artery without angina pectoris: Secondary | ICD-10-CM | POA: Insufficient documentation

## 2019-05-18 DIAGNOSIS — Z79890 Hormone replacement therapy (postmenopausal): Secondary | ICD-10-CM | POA: Insufficient documentation

## 2019-05-18 DIAGNOSIS — Z86718 Personal history of other venous thrombosis and embolism: Secondary | ICD-10-CM | POA: Insufficient documentation

## 2019-05-18 DIAGNOSIS — E039 Hypothyroidism, unspecified: Secondary | ICD-10-CM | POA: Diagnosis not present

## 2019-05-18 DIAGNOSIS — Z9981 Dependence on supplemental oxygen: Secondary | ICD-10-CM | POA: Diagnosis not present

## 2019-05-18 DIAGNOSIS — J449 Chronic obstructive pulmonary disease, unspecified: Secondary | ICD-10-CM | POA: Insufficient documentation

## 2019-05-18 DIAGNOSIS — Z20822 Contact with and (suspected) exposure to covid-19: Secondary | ICD-10-CM | POA: Insufficient documentation

## 2019-05-18 DIAGNOSIS — R55 Syncope and collapse: Principal | ICD-10-CM | POA: Diagnosis present

## 2019-05-18 DIAGNOSIS — R079 Chest pain, unspecified: Secondary | ICD-10-CM

## 2019-05-18 DIAGNOSIS — R110 Nausea: Secondary | ICD-10-CM | POA: Diagnosis not present

## 2019-05-18 DIAGNOSIS — I480 Paroxysmal atrial fibrillation: Secondary | ICD-10-CM | POA: Insufficient documentation

## 2019-05-18 DIAGNOSIS — Z79899 Other long term (current) drug therapy: Secondary | ICD-10-CM | POA: Insufficient documentation

## 2019-05-18 DIAGNOSIS — Z794 Long term (current) use of insulin: Secondary | ICD-10-CM | POA: Insufficient documentation

## 2019-05-18 DIAGNOSIS — E1122 Type 2 diabetes mellitus with diabetic chronic kidney disease: Secondary | ICD-10-CM | POA: Diagnosis present

## 2019-05-18 DIAGNOSIS — I130 Hypertensive heart and chronic kidney disease with heart failure and stage 1 through stage 4 chronic kidney disease, or unspecified chronic kidney disease: Secondary | ICD-10-CM | POA: Insufficient documentation

## 2019-05-18 DIAGNOSIS — N183 Chronic kidney disease, stage 3 unspecified: Secondary | ICD-10-CM | POA: Insufficient documentation

## 2019-05-18 DIAGNOSIS — I5032 Chronic diastolic (congestive) heart failure: Secondary | ICD-10-CM | POA: Diagnosis not present

## 2019-05-18 DIAGNOSIS — Z8673 Personal history of transient ischemic attack (TIA), and cerebral infarction without residual deficits: Secondary | ICD-10-CM | POA: Diagnosis not present

## 2019-05-18 DIAGNOSIS — N179 Acute kidney failure, unspecified: Secondary | ICD-10-CM

## 2019-05-18 DIAGNOSIS — R7989 Other specified abnormal findings of blood chemistry: Secondary | ICD-10-CM | POA: Diagnosis not present

## 2019-05-18 DIAGNOSIS — I10 Essential (primary) hypertension: Secondary | ICD-10-CM | POA: Diagnosis not present

## 2019-05-18 DIAGNOSIS — W19XXXA Unspecified fall, initial encounter: Secondary | ICD-10-CM | POA: Diagnosis not present

## 2019-05-18 DIAGNOSIS — R778 Other specified abnormalities of plasma proteins: Secondary | ICD-10-CM

## 2019-05-18 DIAGNOSIS — Z89422 Acquired absence of other left toe(s): Secondary | ICD-10-CM | POA: Diagnosis not present

## 2019-05-18 DIAGNOSIS — R748 Abnormal levels of other serum enzymes: Secondary | ICD-10-CM | POA: Insufficient documentation

## 2019-05-18 DIAGNOSIS — Z7901 Long term (current) use of anticoagulants: Secondary | ICD-10-CM | POA: Insufficient documentation

## 2019-05-18 DIAGNOSIS — R112 Nausea with vomiting, unspecified: Secondary | ICD-10-CM | POA: Diagnosis not present

## 2019-05-18 DIAGNOSIS — R1110 Vomiting, unspecified: Secondary | ICD-10-CM

## 2019-05-18 DIAGNOSIS — E785 Hyperlipidemia, unspecified: Secondary | ICD-10-CM | POA: Insufficient documentation

## 2019-05-18 DIAGNOSIS — R1111 Vomiting without nausea: Secondary | ICD-10-CM | POA: Diagnosis not present

## 2019-05-18 DIAGNOSIS — K219 Gastro-esophageal reflux disease without esophagitis: Secondary | ICD-10-CM | POA: Insufficient documentation

## 2019-05-18 DIAGNOSIS — F329 Major depressive disorder, single episode, unspecified: Secondary | ICD-10-CM | POA: Insufficient documentation

## 2019-05-18 DIAGNOSIS — J4489 Other specified chronic obstructive pulmonary disease: Secondary | ICD-10-CM | POA: Diagnosis present

## 2019-05-18 DIAGNOSIS — D649 Anemia, unspecified: Secondary | ICD-10-CM | POA: Insufficient documentation

## 2019-05-18 DIAGNOSIS — Z4682 Encounter for fitting and adjustment of non-vascular catheter: Secondary | ICD-10-CM | POA: Diagnosis not present

## 2019-05-18 LAB — CBC
HCT: 29.9 % — ABNORMAL LOW (ref 36.0–46.0)
Hemoglobin: 10 g/dL — ABNORMAL LOW (ref 12.0–15.0)
MCH: 30.9 pg (ref 26.0–34.0)
MCHC: 33.4 g/dL (ref 30.0–36.0)
MCV: 92.3 fL (ref 80.0–100.0)
Platelets: 200 10*3/uL (ref 150–400)
RBC: 3.24 MIL/uL — ABNORMAL LOW (ref 3.87–5.11)
RDW: 16.8 % — ABNORMAL HIGH (ref 11.5–15.5)
WBC: 7 10*3/uL (ref 4.0–10.5)
nRBC: 0 % (ref 0.0–0.2)

## 2019-05-18 LAB — COMPREHENSIVE METABOLIC PANEL
ALT: 21 U/L (ref 0–44)
AST: 28 U/L (ref 15–41)
Albumin: 3.9 g/dL (ref 3.5–5.0)
Alkaline Phosphatase: 89 U/L (ref 38–126)
Anion gap: 9 (ref 5–15)
BUN: 60 mg/dL — ABNORMAL HIGH (ref 8–23)
CO2: 24 mmol/L (ref 22–32)
Calcium: 9.4 mg/dL (ref 8.9–10.3)
Chloride: 101 mmol/L (ref 98–111)
Creatinine, Ser: 1.75 mg/dL — ABNORMAL HIGH (ref 0.44–1.00)
GFR calc Af Amer: 29 mL/min — ABNORMAL LOW (ref >60)
GFR calc non Af Amer: 25 mL/min — ABNORMAL LOW (ref >60)
Glucose, Bld: 80 mg/dL (ref 70–99)
Potassium: 4.3 mmol/L (ref 3.5–5.1)
Sodium: 134 mmol/L — ABNORMAL LOW (ref 135–145)
Total Bilirubin: 0.7 mg/dL (ref 0.3–1.2)
Total Protein: 8 g/dL (ref 6.5–8.1)

## 2019-05-18 LAB — CBG MONITORING, ED: Glucose-Capillary: 78 mg/dL (ref 70–99)

## 2019-05-18 LAB — URINALYSIS, ROUTINE W REFLEX MICROSCOPIC
Bacteria, UA: NONE SEEN
Bilirubin Urine: NEGATIVE
Glucose, UA: NEGATIVE mg/dL
Hgb urine dipstick: NEGATIVE
Ketones, ur: NEGATIVE mg/dL
Leukocytes,Ua: NEGATIVE
Nitrite: NEGATIVE
Protein, ur: NEGATIVE mg/dL
Specific Gravity, Urine: 1.011 (ref 1.005–1.030)
pH: 8 (ref 5.0–8.0)

## 2019-05-18 LAB — LIPASE, BLOOD: Lipase: 22 U/L (ref 11–51)

## 2019-05-18 LAB — MAGNESIUM: Magnesium: 2.2 mg/dL (ref 1.7–2.4)

## 2019-05-18 LAB — TROPONIN I (HIGH SENSITIVITY)
Troponin I (High Sensitivity): 18 ng/L — ABNORMAL HIGH (ref <18)
Troponin I (High Sensitivity): 17 ng/L (ref <18)

## 2019-05-18 LAB — PROTIME-INR
INR: 1.7 — ABNORMAL HIGH (ref 0.8–1.2)
Prothrombin Time: 19.9 seconds — ABNORMAL HIGH (ref 11.4–15.2)

## 2019-05-18 MED ORDER — ASPIRIN 81 MG PO CHEW
324.0000 mg | CHEWABLE_TABLET | Freq: Once | ORAL | Status: AC
  Administered 2019-05-18: 324 mg via ORAL
  Filled 2019-05-18: qty 4

## 2019-05-18 MED ORDER — ACETAMINOPHEN 650 MG RE SUPP: 650.0000 mg | Freq: Four times a day (QID) | RECTAL | Status: DC | PRN

## 2019-05-18 MED ORDER — SODIUM CHLORIDE 0.9% FLUSH: 3.0000 mL | Freq: Two times a day (BID) | INTRAVENOUS | Status: DC

## 2019-05-18 MED ORDER — INSULIN ASPART 100 UNIT/ML ~~LOC~~ SOLN
0.0000 [IU] | Freq: Three times a day (TID) | SUBCUTANEOUS | Status: DC
  Filled 2019-05-18: qty 0.09

## 2019-05-18 MED ORDER — HYDRALAZINE HCL 20 MG/ML IJ SOLN
5.0000 mg | INTRAMUSCULAR | Status: DC | PRN
  Administered 2019-05-19: 5 mg via INTRAVENOUS
  Filled 2019-05-18: qty 1

## 2019-05-18 MED ORDER — ACETAMINOPHEN 325 MG PO TABS
650.0000 mg | ORAL_TABLET | Freq: Four times a day (QID) | ORAL | Status: DC | PRN
  Administered 2019-05-19: 13:00:00 650 mg via ORAL
  Filled 2019-05-18: qty 2

## 2019-05-18 MED ORDER — SODIUM CHLORIDE 0.9 % IV SOLN: INTRAVENOUS | Status: AC

## 2019-05-18 MED ORDER — INSULIN ASPART 100 UNIT/ML ~~LOC~~ SOLN
0.0000 [IU] | Freq: Every day | SUBCUTANEOUS | Status: DC
  Filled 2019-05-18: qty 0.05

## 2019-05-18 MED ORDER — IPRATROPIUM-ALBUTEROL 0.5-2.5 (3) MG/3ML IN SOLN: 3.0000 mL | Freq: Four times a day (QID) | RESPIRATORY_TRACT | Status: DC | PRN

## 2019-05-18 MED ORDER — IPRATROPIUM-ALBUTEROL 20-100 MCG/ACT IN AERS: 2.0000 | INHALATION_SPRAY | Freq: Four times a day (QID) | RESPIRATORY_TRACT | Status: DC | PRN

## 2019-05-18 MED ORDER — WARFARIN - PHARMACIST DOSING INPATIENT: Freq: Every day | Status: DC

## 2019-05-18 MED ORDER — HYDRALAZINE HCL 20 MG/ML IJ SOLN: 5.0000 mg | INTRAMUSCULAR | Status: DC | PRN

## 2019-05-18 NOTE — ED Provider Notes (Signed)
[facility] DEPT Provider Note   CSN: [id number] Arrival date & time: 05/18/19  1859     History Chief Complaint  Patient presents with  . Loss of Consciousness    Shannon [name] is a 86 y.o. female.  HPI   Patient presented to the ED for evaluation after an episode of slurred/abnormal speech followed by syncopal episode.  According to the family members, during the event the patient's oxygen saturation dropped.  Family provided supplemental oxygen.  Patient herself tells me this evening she had an episode of discomfort in her upper abdomen and her chest.  She felt like she had to spit up.  Patient states she does not feel great now but is not specifically having any pain.  She denies any difficulty with her breathing.  No trouble with diarrhea or constipation.  She has not noticed any blood in her stool.  Past Medical History:  Diagnosis Date  . Acute respiratory failure ([place])   . Anemia    previous blood transfusions  . Arthritis    "all over"  . Asthma   . Atrial flutter ([place])   . Bradycardia    requiring previous d/c of BB and reduction of amiodarone  . CAD (coronary artery disease)    nonobstructive per notes  . Chronic diastolic CHF (congestive heart failure) ([place])   . CKD (chronic kidney disease), stage III   . Complication of blood transfusion    "got the wrong blood type at [facility] in ~ 2015; no adverse reaction that we are aware of"/daughter, [name] (01/27/2016)  . COPD (chronic obstructive pulmonary disease) ([place])   . Depression    "light case"  . DVT (deep venous thrombosis) ([place]) 01/2016   a. LLE DVT 01/2016 - switched from Eliquis to Coumadin.  [name] Dyspnea    with some activity  . Gastric stenosis    a. s/p stomach tube  . GERD (gastroesophageal reflux disease)   . History of blood transfusion    "several" (01/27/2016)  . History of stomach ulcers   . Hyperlipidemia   . Hypertension   . Hypothyroidism   . On home oxygen  therapy    "2L; 9PM til 9AM" (06/27/2017)  . PAD (peripheral artery disease) ([place])    a. prior gangrene, toe amputations, intervention.  [name] PAF (paroxysmal atrial fibrillation) ([place])   . Paraesophageal hernia   . Perforated gastric ulcer ([place])   . Pneumonia    "a few times" (06/27/2017)  . Seasonal allergies   . SIADH (syndrome of inappropriate ADH production) ([place])    [name] 01/10/2015  . Small bowel obstruction ([place])    "I don't know how many" (01/11/2015)  . Stroke [facility])    several- no residual  . Type II diabetes mellitus ([place])    "related to prednisone use  for > 20 yr; once predinose stopped; no more DM RX" (01/27/2016)  . UTI (urinary tract infection) 02/08/2016  . Ventral hernia with bowel obstruction     Patient Active Problem List   Diagnosis Date Noted  . Jejunostomy tube fell out 03/02/2019  . S/P amputation of lesser toe, left ([place]) 02/13/2019  . BRBPR (bright red blood per rectum) 01/10/2019  . GI bleed 12/25/2018  . Bradycardia 11/25/2018  . Amputated great toe of right foot ([place]) 05/06/2018  . Peripheral vascular disease ([place]) 05/03/2018  . Localized, primary osteoarthritis of shoulder region 01/21/2018  . PAD (peripheral artery disease) ([place]) 06/27/2017  . AKI (acute kidney injury) (  [place]) 02/20/2017  . Complication of feeding tube ([place]) 02/20/2017  . PEG tube malfunction ([place])   . COPD with asthma ([place]) 10/03/2016  . Chronic deep vein thrombosis (DVT) of tibial vein of left lower extremity ([place]) 10/03/2016  . Gangrene of toe of left foot ([place]) 09/15/2016  . Recurrent aspiration pneumonia ([place])   . Atherosclerosis of native arteries of the extremities with gangrene ([place]) 07/28/2016  . Other specified hypothyroidism   . CHF (NYHA class IV, ACC/AHA stage D) ([place])   . Shortness of breath   . Pressure injury of skin 03/11/2016  . PEG (percutaneous endoscopic gastrostomy) status ([place]) 03/10/2016  . Encounter for therapeutic drug monitoring 02/16/2016  . Warfarin  anticoagulation   . Chronic seasonal allergic rhinitis   . Atrial fibrillation and flutter ([place]) 01/24/2016  . Chronic diastolic CHF (congestive heart failure) ([place]) 01/24/2016  . Bilateral leg edema 12/29/2015  . Hypothyroidism due to medication   . Weakness   . Pyloric stenosis   . Pressure ulcer 09/15/2015  . Acute osteomyelitis of toe of right foot ([place])   . PVD (peripheral vascular disease) ([place])   . Diabetic ulcer of toe of right foot associated with type 2 diabetes mellitus, with necrosis of muscle ([place])   . Diverticulitis of large intestine without perforation or abscess without bleeding   . Colitis 08/26/2015  . Esophageal candidiasis ([place])   . SOB (shortness of breath)   . Acute renal failure superimposed on stage 4 chronic kidney disease ([place])   . Encounter for long-term (current) use of other high-risk medications   . Dysphagia 07/05/2015  . Asthma with allergic rhinitis 06/04/2015  . Hypertensive heart disease 03/18/2015  . Malnutrition of moderate degree 01/28/2015  . Syncope 01/27/2015  . Symptomatic bradycardia 01/21/2015  . Hypotension due to drugs 01/21/2015  . Protein-calorie malnutrition, severe 01/12/2015  . Cerebrovascular disease 07/14/2014  . Diabetic polyneuropathy associated with type 2 diabetes mellitus ([place]) 07/14/2014  . Hyperlipidemia 07/14/2014  . Gastric outlet obstruction 05/21/2014  . Unstable gait 04/29/2014  . Slurred speech   . Gastroparesis   . Constipation 02/03/2014  . Carotid stenosis 02/03/2014  . Demand ischemia ([place]) 01/21/2014  . Gastric bezoar 01/20/2014  . Anemia of chronic disease 01/18/2014  . Chronic kidney insufficiency, stage 3 (moderate) 01/18/2014  . Depression 09/29/2013  . Essential hypertension, benign 09/22/2013  . GERD (gastroesophageal reflux disease) 09/22/2013  . Dyslipidemia 09/22/2013  . Type 2 diabetes mellitus with chronic kidney disease ([place]) 09/22/2013  . Persistent atrial fibrillation 09/22/2013  .  History of completed stroke 09/22/2013  . Diabetic neuropathy ([place]) 09/22/2013  . Hypothyroidism 09/22/2013  . Chronic gastrojejunal ulcer with perforation ([place]) 09/07/2013  . Accelerated hypertension 09/07/2013  . Systemic lupus erythematosus (SLE) inhibitor ([place]) 09/07/2013  . Other specified abnormal immunological findings in serum 09/07/2013  . Red blood cell antibody positive 09/07/2013    Past Surgical History:  Procedure Laterality Date  . ABDOMINAL AORTOGRAM N/A 09/21/2016   Procedure: Abdominal Aortogram;  Surgeon: Waynetta Sandy, MD;  Location: [facility];  Service: Cardiovascular;  Laterality: N/A;  . AMPUTATION Left 09/25/2016   Procedure: AMPUTATION DIGIT- LEFT 2ND AND 3RD TOES;  Surgeon: Rosetta Posner, MD;  Location: [facility];  Service: Vascular;  Laterality: Left;  . AMPUTATION Right 05/03/2018   Procedure: AMPUTATION RIGHT GREAT TOE;  Surgeon: Rosetta Posner, MD;  Location: [facility];  Service: Vascular;  Laterality: Right;  . CATARACT EXTRACTION W/ INTRAOCULAR LENS  IMPLANT, BILATERAL    .  CHOLECYSTECTOMY OPEN    . COLECTOMY    . ESOPHAGOGASTRODUODENOSCOPY N/A 01/19/2014   Procedure: ESOPHAGOGASTRODUODENOSCOPY (EGD);  Surgeon: Irene Shipper, MD;  [facility];  Service: Endoscopy;  Laterality: N/A;  . ESOPHAGOGASTRODUODENOSCOPY N/A 01/20/2014   Procedure: ESOPHAGOGASTRODUODENOSCOPY (EGD);  Surgeon: Irene Shipper, MD;  Location[facility];  Service: Endoscopy;  Laterality: N/A;  . ESOPHAGOGASTRODUODENOSCOPY N/A 03/19/2014   Procedure: ESOPHAGOGASTRODUODENOSCOPY (EGD);  Surgeon: Milus Banister, MD;  Location: [facility];  Service: Endoscopy;  Laterality: N/A;  . ESOPHAGOGASTRODUODENOSCOPY N/A 07/08/2015   Procedure: ESOPHAGOGASTRODUODENOSCOPY (EGD);  Surgeon: Doran Stabler, MD;  Location: [facility] ENDOSCOPY;  Service: Endoscopy;  Laterality: N/A;  . ESOPHAGOGASTRODUODENOSCOPY (EGD) WITH PROPOFOL N/A 09/15/2015   Procedure: ESOPHAGOGASTRODUODENOSCOPY (EGD)  WITH PROPOFOL;  Surgeon: Manus Gunning, MD;  [facility];  Service: Gastroenterology;  Laterality: N/A;  . GASTROJEJUNOSTOMY     hx/notes 01/10/2015  . GASTROJEJUNOSTOMY N/A 09/23/2015   Procedure: OPEN GASTROJEJUNOSTOMY TUBE PLACEMENT;  Surgeon: Arta Bruce Kinsinger, MD;  Location: [facility];  Service: General;  Laterality: N/A;  . GLAUCOMA SURGERY Bilateral   . HERNIA REPAIR  2015  . IR CM INJ ANY COLONIC TUBE W/FLUORO  01/05/2017  . IR CM INJ ANY COLONIC TUBE W/FLUORO  06/07/2017  . IR CM INJ ANY COLONIC TUBE W/FLUORO  11/05/2017  . IR CM INJ ANY COLONIC TUBE W/FLUORO  09/18/2018  . IR CM INJ ANY COLONIC TUBE W/FLUORO  03/06/2019  . IR CM INJ ANY COLONIC TUBE W/FLUORO  03/11/2019  . IR GENERIC HISTORICAL  01/07/2016   IR GJ TUBE CHANGE 01/07/2016 Jacqulynn Cadet, MD [facility]  . IR GENERIC HISTORICAL  01/27/2016   IR MECH REMOV OBSTRUC MAT ANY COLON TUBE W/FLUORO 01/27/2016 Markus Daft, MD [facility]  . IR GENERIC HISTORICAL  02/07/2016   IR PATIENT EVAL TECH 0-60 MINS Darrell K Allred, PA-C [facility]  . IR GENERIC HISTORICAL  02/08/2016   IR GJ TUBE CHANGE 02/08/2016 Greggory Keen, MD [facility]  . IR GENERIC HISTORICAL  01/06/2016   IR GJ TUBE CHANGE 01/06/2016 [facility] OUT REF  . IR GENERIC HISTORICAL  05/02/2016   IR CM INJ ANY COLONIC TUBE W/FLUORO 05/02/2016 Arne Cleveland, MD [facility]  . IR GENERIC HISTORICAL  05/15/2016   IR GJ TUBE CHANGE 05/15/2016 Sandi Mariscal, MD [facility]  . IR GENERIC HISTORICAL  06/28/2016   IR GJ TUBE CHANGE 06/28/2016 [facility]  . IR GJ TUBE CHANGE  02/20/2017  . IR GJ TUBE CHANGE  05/10/2017  . IR GJ TUBE CHANGE  07/06/2017  . IR GJ TUBE CHANGE  08/02/2017  . IR GJ TUBE CHANGE  10/15/2017  . IR GJ TUBE CHANGE  12/26/2017  . IR [place] TUBE CHANGE  04/08/2018  . IR [place] TUBE CHANGE  06/19/2018  . IR [place] TUBE CHANGE  03/03/2019  . IR PATIENT EVAL TECH 0-60 MINS  10/19/2016  . IR PATIENT EVAL TECH 0-60 MINS  12/25/2016  . IR PATIENT EVAL TECH  0-60 MINS  01/29/2017  . IR PATIENT EVAL TECH 0-60 MINS  04/04/2017  . IR PATIENT EVAL TECH 0-60 MINS  04/30/2017  . IR PATIENT EVAL TECH 0-60 MINS  07/31/2017  . IR REPLC DUODEN/JEJUNO TUBE PERCUT W/FLUORO  11/14/2016  . LAPAROTOMY N/A 01/20/2015   Procedure: EXPLORATORY LAPAROTOMY;  Surgeon: Coralie Keens, MD;  Location: [facility];  Service: General;  Laterality: N/A;  . LOWER EXTREMITY ANGIOGRAPHY Left 09/21/2016   Procedure: Lower Extremity Angiography;  Surgeon: Waynetta Sandy, MD;  Location: [facility];  Service: Cardiovascular;  Laterality: Left;  . LOWER EXTREMITY ANGIOGRAPHY Right 06/27/2017   Procedure: Lower Extremity Angiography;  Surgeon: Waynetta Sandy, MD;  Location: [facility];  Service: Cardiovascular;  Laterality: Right;  . LYSIS OF ADHESION N/A 01/20/2015   Procedure: LYSIS OF ADHESIONS < 1 HOUR;  Surgeon: Coralie Keens, MD;  Location: [facility];  Service: General;  Laterality: N/A;  . PERIPHERAL VASCULAR BALLOON ANGIOPLASTY Left 09/21/2016   Procedure: Peripheral Vascular Balloon Angioplasty;  Surgeon: Waynetta Sandy, MD;  Location: [facility];  Service: Cardiovascular;  Laterality: Left;  drug coated balloon  . PERIPHERAL VASCULAR BALLOON ANGIOPLASTY Right 06/27/2017   Procedure: PERIPHERAL VASCULAR BALLOON ANGIOPLASTY;  Surgeon: Waynetta Sandy, MD;  Location: [facility];  Service: Cardiovascular;  Laterality: Right;  SFA/TPTRUNK  . TONSILLECTOMY    . TUBAL LIGATION    . VENTRAL HERNIA REPAIR  2015   incarcerated ventral hernia ([facility] 09/2013)/notes 01/10/2015     OB History   No obstetric history on file.     Family History  Problem Relation Age of Onset  . Stroke Mother   . Hypertension Mother   . Diabetes Brother   . Glaucoma Son   . Glaucoma Son   . Heart attack Neg Hx   . Colon cancer Neg Hx   . Stomach cancer Neg Hx     Social History   Tobacco Use  . Smoking status: Never Smoker  .  Smokeless tobacco: Former Systems developer    Types: Snuff  . Tobacco comment: "used snuff in my younger days"  Substance Use Topics  . Alcohol use: Never    Alcohol/week: 0.0 standard drinks  . Drug use: Never    Home Medications Prior to Admission medications   Medication Sig Start Date End Date Taking? Authorizing Provider  acetaminophen (TYLENOL) 500 MG tablet Take 500 mg by mouth every 6 (six) hours as needed (pain).    Yes [provider]  Amino Acids-Protein Hydrolys (FEEDING SUPPLEMENT, PRO-STAT SUGAR FREE 64,) LIQD Take 30 mLs by mouth 2 (two) times daily.   Yes [provider]  amLODipine (NORVASC) 5 MG tablet Take 1 tablet (5 mg total) by mouth daily. 11/26/18 11/26/19 Yes Spongberg, Audie Pinto, MD  atorvastatin (LIPITOR) 10 MG tablet Take 1 tablet (10 mg total) by mouth daily. 09/25/18  Yes [name], NP  b complex vitamins tablet Take 1 tablet by mouth daily.    Yes [provider]  calcium-vitamin D (OSCAL WITH D) 500-200 MG-UNIT per tablet Take 1 tablet by mouth daily with breakfast.    Yes [provider]  Carboxymethylcellulose Sodium (ARTIFICIAL TEARS OP) Place 1 drop into both eyes 4 (four) times daily.   Yes [provider]  cycloSPORINE (RESTASIS) 0.05 % ophthalmic emulsion USE 1 DROP INTO BOTH EYES TWICE DAILY Patient taking differently: Place 1 drop into both eyes 2 (two) times daily.  01/29/17  Yes Carter, [place], DO  DULERA 200-5 MCG/ACT AERO TAKE TWO PUFFS BY MOUTH TWICE DAILY Patient taking differently: Inhale 2 puffs into the lungs in the morning and at bedtime.  01/13/19  Yes [name], NP  ferrous sulfate 325 (65 FE) MG tablet TAKE 1 TABLET BY MOUTH EVERY DAY Patient taking differently: Take 325 mg by mouth daily.  04/30/19  Yes [name], NP  furosemide (LASIX) 20 MG tablet Take 20-40 mg by mouth See  admin instructions. Daily unless swelling occurs take 40 mg tablet and then switch back to 20 mg daily     Yes [provider]  gabapentin (NEURONTIN) 300 MG capsule TAKE 1 CAPSULE BY MOUTH TWICE A DAY Patient taking differently: Take 300 mg by mouth 2 (two) times daily.  02/06/19  Yes [name], NP  gentamicin cream (GARAMYCIN) 0.1 % Apply 1 application topically 3 (three) times daily as needed (dry skin). 04/23/19  Yes Edrick Kins, DPM  hydrALAZINE (APRESOLINE) 50 MG tablet TAKE 1 TABLET BY MOUTH THREE TIMES A DAY Patient taking differently: Take 50 mg by mouth 3 (three) times daily.  02/06/19  Yes Dorothy Spark, MD  isosorbide mononitrate (IMDUR) 30 MG 24 hr tablet TAKE 2 TABLETS BY MOUTH DAILY. PLEASE MAKE ANNUAL APPT WITH DR. Meda Coffee FOR FUTURE REFILLS. THANK YOU. 1ST ATTEMPT. Patient taking differently: Take 60 mg by mouth daily.  12/27/18  Yes Dorothy Spark, MD  levothyroxine (SYNTHROID) 125 MCG tablet TAKE 1 TABLET BY MOUTH EVERY DAY Patient taking differently: Take 125 mcg by mouth daily before breakfast.  03/18/19  Yes [name], NP  loratadine (CLARITIN) 10 MG tablet Take 10 mg by mouth daily as needed for allergies.   Yes [provider]  Multiple Vitamin (MULTIVITAMIN WITH MINERALS) TABS tablet Take 1 tablet by mouth daily.   Yes [provider]  nystatin (MYCOSTATIN/NYSTOP) powder Apply 1 g topically as needed (skin irritation).    Yes [provider]  oxyCODONE (ROXICODONE) 5 MG immediate release tablet Take 1 tablet (5 mg total) by mouth every 4 (four) hours as needed for severe pain. May take 1/2 tab q4hrs as needed for mild pain. Patient taking differently: Take 2.5-5 mg by mouth every 4 (four) hours as needed for severe pain.  06/14/18  Yes [name], NP  pantoprazole (PROTONIX) 40 MG tablet TAKE 1 TABLET BY MOUTH TWICE A DAY Patient taking differently: Take 40 mg by mouth 2 (two) times daily.  12/24/18  Yes [name], NP  Probiotic Product (EQL PROBIOTIC COLON SUPPORT PO) Take 1 capsule by mouth daily.    Yes [provider]  sertraline (ZOLOFT) 50 MG tablet TAKE 1 TABLET BY MOUTH EVERY DAY Patient taking differently: Take 50 mg by mouth daily.  05/09/19  Yes [name], NP  Water For Irrigation, Sterile (FREE WATER) SOLN Place 180 mLs into feeding tube 3 (three) times daily.    Yes [provider]  feeding supplement (OSMOLITE 1 CAL) LIQD Take 711 mLs by mouth See admin instructions. For 12 hours 2100 - 0900    [provider]  fluticasone (FLONASE) 50 MCG/ACT nasal spray SPRAY 2 SPRAYS INTO EACH NOSTRIL EVERY DAY Patient taking differently: Place 2 sprays into both nostrils daily.  02/06/19   [name], NP  insulin aspart (NOVOLOG) 100 UNIT/ML FlexPen Inject 0-6 Units into the skin 3 (three) times daily with meals. New sliding Scale If BS is 151 - 251 = 2 units,  If BS is 251 -  350 = 4 units,  If BS is 351 - 450 = 6 units Call provider if BS runs more than 400 02/03/19   [name], NP  Insulin Glargine (LANTUS SOLOSTAR) 100 UNIT/ML Solostar Pen Inject 8 Units into the skin at bedtime. 02/20/19   [name], NP  Insulin Pen Needle (B-D UF III MINI PEN NEEDLES) 31G X 5 MM MISC Use twice daily with giving  insulin injections. Dx E11.9 07/17/18   [name], NP  ipratropium-albuterol (DUONEB) 0.5-2.5 (3) MG/3ML SOLN Take 3 mLs by nebulization every 6 (six) hours as needed (sob). Use 3 times daily x 4 days then every 6 hours as needed. Patient taking differently: Take 3 mLs by nebulization every 6 (six) hours as needed (sob).  03/21/16   Eileen Stanford, PA-C  latanoprost (XALATAN) 0.005 % ophthalmic solution Place 1 drop into both eyes at bedtime. 06/02/14   Blanchie Serve, MD  levalbuterol [facility] HFA) 45 MCG/ACT inhaler Inhale 2 puffs into the lungs every 6 (six) hours as needed for wheezing or shortness of breath. 02/03/16   Gildardo Cranker, DO  OXYGEN Inhale 2 L into the lungs at bedtime.    [provider]  warfarin (COUMADIN)  2.5 MG tablet Take 2-3 tablets by mouth daily as directed by Coumadin clinic. Patient taking differently: Take 5-7.5 mg by mouth See admin instructions. 7.5 mg on Sunday and Thursday and all other day 5 mg. Pt takes in the evening 03/24/19   Dorothy Spark, MD  White Petrolatum-Mineral Oil (SYSTANE NIGHTTIME) OINT Place 1 application into both eyes at bedtime.     [provider]    Allergies    Penicillins  Review of Systems   Review of Systems  Constitutional: Negative for fever.  Respiratory: Negative for cough and shortness of breath.   Genitourinary: Negative for dysuria.  All other systems reviewed and are negative.   Physical Exam Updated Vital Signs BP (!) 148/55   Pulse 67   Temp 98.2 F (36.8 C)   Resp 13   Ht 1.676 m (5\' 6" )   Wt 61.3 kg   SpO2 100%   BMI 21.81 kg/m   Physical Exam Vitals and nursing note reviewed.  Constitutional:      Appearance: She is well-developed. She is not toxic-appearing or diaphoretic.  HENT:     Head: Normocephalic and atraumatic.     Right Ear: External ear normal.     Left Ear: External ear normal.  Eyes:     General: No scleral icterus.       Right eye: No discharge.        Left eye: No discharge.     Conjunctiva/sclera: Conjunctivae normal.  Neck:     Trachea: No tracheal deviation.  Cardiovascular:     Rate and Rhythm: Normal rate and regular rhythm.  Pulmonary:     Effort: Pulmonary effort is normal. No respiratory distress.     Breath sounds: Normal breath sounds. No stridor. No wheezing or rales.  Abdominal:     General: Bowel sounds are normal. There is no distension.     Palpations: Abdomen is soft.     Tenderness: There is no abdominal tenderness. There is no guarding or rebound.     Comments: Ventral hernia noted, feeding tube in place  Musculoskeletal:        General: No tenderness.     Cervical back: Neck supple.     Comments: DP pulses palpable bilaterally  Skin:    General: Skin is warm and  dry.     Findings: No rash.     Comments: Ulceration noted toes, no surrounding erythema,   Neurological:     General: No focal deficit present.     Mental Status: She is alert.     Cranial Nerves: No cranial nerve deficit (no facial droop, extraocular movements intact, no slurred speech).     Sensory:  No sensory deficit.     Motor: No abnormal muscle tone or seizure activity.     Coordination: Coordination normal.     ED Results / Procedures / Treatments   Labs (all labs ordered are listed, but only abnormal results are displayed) Labs Reviewed  CBC - Abnormal; Notable for the following components:      Result Value   RBC 3.24 (*)    Hemoglobin 10.0 (*)    HCT 29.9 (*)    RDW 16.8 (*)    All other components within normal limits  COMPREHENSIVE METABOLIC PANEL - Abnormal; Notable for the following components:   Sodium 134 (*)    BUN 60 (*)    Creatinine, Ser 1.75 (*)    GFR calc non Af Amer 25 (*)    GFR calc Af Amer 29 (*)    All other components within normal limits  URINALYSIS, ROUTINE W REFLEX MICROSCOPIC - Abnormal; Notable for the following components:   APPearance HAZY (*)    All other components within normal limits  PROTIME-INR - Abnormal; Notable for the following components:   Prothrombin Time 19.9 (*)    INR 1.7 (*)    All other components within normal limits  BASIC METABOLIC PANEL - Abnormal; Notable for the following components:   BUN 60 (*)    Creatinine, Ser 1.72 (*)    GFR calc non Af Amer 25 (*)    GFR calc Af Amer 29 (*)    All other components within normal limits  PROTIME-INR - Abnormal; Notable for the following components:   Prothrombin Time 18.0 (*)    INR 1.5 (*)    All other components within normal limits  D-DIMER, QUANTITATIVE (NOT AT [facility]) - Abnormal; Notable for the following components:   D-Dimer, Quant 0.99 (*)    All other components within normal limits  PROTIME-INR - Abnormal; Notable for the following components:   Prothrombin  Time 19.6 (*)    INR 1.7 (*)    All other components within normal limits  GLUCOSE, CAPILLARY - Abnormal; Notable for the following components:   Glucose-Capillary 117 (*)    All other components within normal limits  TROPONIN I (HIGH SENSITIVITY) - Abnormal; Notable for the following components:   Troponin I (High Sensitivity) 18 (*)    All other components within normal limits  SARS CORONAVIRUS 2 (TAT 6-24 HRS)  MAGNESIUM  LIPASE, BLOOD  GLUCOSE, CAPILLARY  GLUCOSE, CAPILLARY  GLUCOSE, CAPILLARY  CBG MONITORING, ED  TROPONIN I (HIGH SENSITIVITY)    EKG EKG Interpretation  Date/Time:  Sunday May 18 2019 19:20:57 EST Ventricular Rate:  69 PR Interval:    QRS Duration: 80 QT Interval:  396 QTC Calculation: 425 R Axis:   62 Text Interpretation: Sinus rhythm Probable left atrial enlargement No significant change since last tracing Confirmed by Dorie Rank [id number]) on 05/18/2019 8:36:28 PM   Radiology DG Abd 1 View  Result Date: 05/18/2019 CLINICAL DATA:  J-tube positioning. EXAM: ABDOMEN - 1 VIEW COMPARISON:  None. FINDINGS: There is a percutaneous gastrojejunostomy tube in place. The positioning of the tube appears grossly unremarkable. The retention bulb is inflated. The configuration of the tube is similar to placement on March 11, 2019. The bowel gas pattern is nonspecific and nonobstructive. IMPRESSION: 1. Unremarkable appearance of the gastrojejunostomy tube 2. Nonobstructive bowel gas pattern. Electronically Signed   By: Constance Holster M.D.   On: 05/18/2019 23:28   CT Head Wo Contrast  Result Date: 05/18/2019 CLINICAL  DATA:  Syncopal episode, weakness EXAM: CT HEAD WITHOUT CONTRAST TECHNIQUE: Contiguous axial images were obtained from the base of the skull through the vertex without intravenous contrast. COMPARISON:  11/24/2018 head CT, 11/02/2016 FINDINGS: Brain: No acute territorial infarction, hemorrhage or intracranial mass. Moderate atrophy. Patchy white matter  hypodensity consistent with chronic small vessel ischemic change. Chronic left greater than right cerebellar infarcts. Vascular: No hyperdense vessels. Carotid and vertebral vascular calcification Skull: No depressed skull fracture. Prominent degenerative change in pannus about the C1-C2 articulation, chronic Sinuses/Orbits: No acute finding. Other: None IMPRESSION: 1. No CT evidence for acute intracranial abnormality. 2. Atrophy and chronic small vessel ischemic change of the white matter Electronically Signed   By: Donavan Foil M.D.   On: 05/18/2019 20:07   DG Chest Portable 1 View  Result Date: 05/18/2019 CLINICAL DATA:  Weakness syncopal episode EXAM: PORTABLE CHEST 1 VIEW COMPARISON:  11/24/2018 FINDINGS: No focal airspace disease or pleural effusion. Mild cardiomegaly. Atelectasis left base. Aortic atherosclerosis. No pneumothorax. IMPRESSION: No active disease.  Mild cardiomegaly. Electronically Signed   By: Donavan Foil M.D.   On: 05/18/2019 20:02     Procedures Procedures (including critical care time)  Medications Ordered in ED Medications  0.9 %  sodium chloride infusion ( Intravenous Stopped 05/19/19 1315)  aspirin chewable tablet 324 mg (324 mg Oral Given 05/18/19 2002)  warfarin (COUMADIN) tablet 7.5 mg (7.5 mg Oral Given 05/19/19 6237)    ED Course  I have reviewed the triage vital signs and the nursing notes.  Pertinent labs & imaging results that were available during my care of the patient were reviewed by me and considered in my medical decision making (see chart for details).  Clinical Course as of May 20 1035  Sun May 18, 2019  2039 CBC reviewed.  Hemoglobin is stable.   [JK]  2040 Head CT and chest x-ray without acute findings   [JK]  2121 Patient's initial troponin is elevated at 18.   [JK]  2131 BUN and CR increased from previous.   [JK]    Clinical Course User Index [JK] Dorie Rank, MD   MDM Rules/Calculators/A&P                      Pt presented to the  ED for evaluation of a syncopal episode.   Sx preceded by chest discomfort and vomiting earlier in the day.  Sx improved by the time she arrived in the ED.  NO active chest pain.  Initial EKG without stemi.  Slight elevation in troponin.  NO acute ct findings and no focal signs of CVA on exam.  Pt remained stable but considering her risk factors, abnormal labs, admitted for further monitoring Final Clinical Impression(s) / ED Diagnoses Final diagnoses:  Near syncope  Chest pain, unspecified type  Elevated troponin  Vomiting      Dorie Rank, MD 05/20/19 1040

## 2019-05-18 NOTE — ED Triage Notes (Signed)
Pt BIB [facility] from home after pt experiencing a moment of slurred/abnormal speech followed by a syncopal episode. During the event, pt O2 dropped down to 76%, family placed pt on 2L [place] which brought her O2 up to 96%. Pt wears O2 at night. According to EMS, pt has hx of this event caused by HTN. Pt takes in food/fluid through [employer]. Per EMS, pt passed their stroke screen with no deficits.   CBG 134

## 2019-05-18 NOTE — ED Notes (Signed)
Pt transported to Xray. 

## 2019-05-18 NOTE — ED Notes (Signed)
ED TO INPATIENT HANDOFF REPORT  Name/Age/Gender Shannon [name] 86 y.o. female  Code Status    Code Status Orders  (From admission, onward)         Start     Ordered   05/18/19 2220  Full code  Continuous     05/18/19 2224        Code Status History    Date Active Date Inactive Code Status Order ID Comments User Context   03/02/2019 0229 03/03/2019 2125 Full Code [id number]  Quintella Baton, MD ED   12/25/2018 1853 12/26/2018 1942 Partial Code [id number]  Donne Hazel, MD ED   12/25/2018 1843 12/25/2018 1853 Full Code [id number]  Donne Hazel, MD ED   11/24/2018 2352 11/26/2018 1821 DNR [id number]  Lenore Cordia, MD Inpatient   05/03/2018 1535 05/09/2018 1920 Full Code [id number]  Merton Border, MD Inpatient   06/27/2017 1507 06/29/2017 1553 Full Code [id number]  Waynetta Sandy, MD Inpatient   02/20/2017 0434 02/23/2017 1956 DNR [id number]  Vianne Bulls, MD ED   09/15/2016 0035 09/27/2016 1742 DNR [id number]  Etta Quill, DO ED   08/12/2016 1706 08/18/2016 2132 DNR [id number]  Theodis Blaze, MD Inpatient   07/28/2016 2325 08/01/2016 2110 DNR [id number]  Lily Kocher, MD ED   06/08/2016 1752 06/16/2016 1805 DNR [id number]  Omar Person, NP ED   05/22/2016 0456 05/30/2016 1738 DNR [id number]  Norval Morton, MD Inpatient   05/09/2016 1049 05/17/2016 1728 DNR [id number]  Rosita Fire, MD Inpatient   05/08/2016 1759 05/09/2016 1049 Full Code [id number]  Rosita Fire, MD ED   03/02/2016 1755 03/14/2016 2016 Partial Code [id number]  Erma Heritage, [place] Inpatient   02/08/2016 0217 02/12/2016 1746 Partial Code [id number]  Norval Morton, MD ED   01/24/2016 1621 02/02/2016 2056 Partial Code [id number]  Charlie Pitter, PA-C Inpatient   01/24/2016 1611 01/24/2016 1621 Partial Code [id number]  Imogene Burn, PA-C Inpatient   01/24/2016 1611 01/24/2016 1611 Partial Code [id number]  Imogene Burn, PA-C Inpatient   11/06/2015 2300 11/11/2015 2201 Partial Code [id number]  Edwin Dada, MD Inpatient   09/12/2015 0411 09/30/2015 1719 Partial Code [id number]  Vianne Bulls, MD Inpatient   08/26/2015 1918 09/05/2015 1824 Partial Code [id number]  Willia Craze, NP Inpatient   08/26/2015 1527 08/26/2015 1918 Full Code [id number]  Willia Craze, NP ED   08/02/2015 1603 08/05/2015 1731 Partial Code [id number]  Erlene Quan, PA-C ED   07/06/2015 1144 07/11/2015 1636 Partial Code [id number]  Minor, Grace Bushy, NP Inpatient   07/05/2015 2118 07/06/2015 1144 Full Code [id number]  Etta Quill, DO ED   01/28/2015 0047 01/29/2015 1732 DNR [id number]  Lavina Hamman, MD Inpatient   01/14/2015 1409 01/27/2015 2148 DNR [id number]  Drue Novel, NP Inpatient   01/10/2015 0501 01/14/2015 1409 Full Code [id number]  Etta Quill, DO ED   05/21/2014 2324 05/30/2014 1829 DNR [id number]  Etta Quill, DO ED   03/27/2014 1733 03/30/2014 1826 DNR [id number]  Phillips Climes, MD Inpatient   03/13/2014 0226 03/26/2014 1631 Full Code [id number]  Rise Patience, MD Inpatient   01/18/2014 0150 01/22/2014 1708 Full Code [id number]  Jani Gravel, MD Inpatient   12/23/2013 2311 12/26/2013 2218 Full Code [id number]  Phillips Grout, MD Inpatient   Advance Care Planning Activity      Home/SNF/Other Home  Chief Complaint Syncope [R55]  Level of Care/Admitting Diagnosis ED Disposition  ED Disposition Condition Comment   Admit  Hospital Area: [facility] [[id number]]  Level of Care: Telemetry [5]  Admit to tele based on following criteria: Eval of Syncope  Covid Evaluation: Asymptomatic Screening Protocol (No Symptoms)  Diagnosis: Syncope [947096]  Admitting Physician: Shela Leff [[id number]]  Attending Physician: Shela Leff [[id number]]       Medical History Past Medical History:  Diagnosis Date  . Acute respiratory failure ([place])   . Anemia    previous blood transfusions  . Arthritis    "all over"  . Asthma   . Atrial flutter ([place])   . Bradycardia     requiring previous d/c of BB and reduction of amiodarone  . CAD (coronary artery disease)    nonobstructive per notes  . Chronic diastolic CHF (congestive heart failure) ([place])   . CKD (chronic kidney disease), stage III   . Complication of blood transfusion    "got the wrong blood type at [facility] in ~ 2015; no adverse reaction that we are aware of"/daughter, [name] (01/27/2016)  . COPD (chronic obstructive pulmonary disease) ([place])   . Depression    "light case"  . DVT (deep venous thrombosis) ([place]) 01/2016   a. LLE DVT 01/2016 - switched from Eliquis to Coumadin.  [name] Dyspnea    with some activity  . Gastric stenosis    a. s/p stomach tube  . GERD (gastroesophageal reflux disease)   . History of blood transfusion    "several" (01/27/2016)  . History of stomach ulcers   . Hyperlipidemia   . Hypertension   . Hypothyroidism   . On home oxygen therapy    "2L; 9PM til 9AM" (06/27/2017)  . PAD (peripheral artery disease) ([place])    a. prior gangrene, toe amputations, intervention.  [name] PAF (paroxysmal atrial fibrillation) ([place])   . Paraesophageal hernia   . Perforated gastric ulcer ([place])   . Pneumonia    "a few times" (06/27/2017)  . Seasonal allergies   . SIADH (syndrome of inappropriate ADH production) ([place])    [name] 01/10/2015  . Small bowel obstruction ([place])    "I don't know how many" (01/11/2015)  . Stroke [facility])    several- no residual  . Type II diabetes mellitus ([place])    "related to prednisone use  for > 20 yr; once predinose stopped; no more DM RX" (01/27/2016)  . UTI (urinary tract infection) 02/08/2016  . Ventral hernia with bowel obstruction     Allergies Allergies  Allergen Reactions  . Penicillins Itching, Rash and Other (See Comments)    Tolerated amoxicillin, unasyn, zosyn & cephalosporins in the past. Did it involve swelling of the face/tongue/throat, SOB, or low BP? No Did it involve sudden or severe rash/hives, skin peeling, or any reaction on the inside of  your mouth or nose? No Did you need to seek medical attention at a hospital or doctor's office? Unknown When did it last happen?5+ years If all above answers are "NO", may proceed with cephalosporin use.     IV Location/Drains/Wounds Patient Lines/Drains/Airways Status   Active Line/Drains/Airways    Name:   Placement date:   Placement time:   Site:   Days:   Peripheral IV 05/18/19 Left Forearm   05/18/19    1914    Forearm   less than 1   Gastrostomy/Enterostomy Jejunostomy 22 Fr. LUQ   03/03/19    1647    LUQ   76   External Urinary Catheter   12/25/18  1630    --   144          Labs/Imaging Results for orders placed or performed during the hospital encounter of 05/18/19 (from the past 48 hour(s))  Troponin I (High Sensitivity)     Status: Abnormal   Collection Time: 05/18/19  7:25 PM  Result Value Ref Range   Troponin I (High Sensitivity) 18 (H) <18 ng/L    Comment: (NOTE) Elevated high sensitivity troponin I (hsTnI) values and significant  changes across serial measurements may suggest ACS but many other  chronic and acute conditions are known to elevate hsTnI results.  Refer to the "Links" section for chest pain algorithms and additional  guidance. Performed at [facility], [place] [address] [place], [place] [address]   CBC     Status: Abnormal   Collection Time: 05/18/19  7:25 PM  Result Value Ref Range   WBC 7.0 4.0 - 10.5 K/uL   RBC 3.24 (L) 3.87 - 5.11 MIL/uL   Hemoglobin 10.0 (L) 12.0 - 15.0 g/dL   HCT 29.9 (L) 36.0 - 46.0 %   MCV 92.3 80.0 - 100.0 fL   MCH 30.9 26.0 - 34.0 pg   MCHC 33.4 30.0 - 36.0 g/dL   RDW 16.8 (H) 11.5 - 15.5 %   Platelets 200 150 - 400 K/uL   nRBC 0.0 0.0 - 0.2 %    Comment: Performed at [facility], [place] [address] [place], [place] [address]  Comprehensive metabolic panel     Status: Abnormal   Collection Time: 05/18/19  7:25 PM  Result Value Ref Range   Sodium 134 (L) 135 - 145 mmol/L    Potassium 4.3 3.5 - 5.1 mmol/L   Chloride 101 98 - 111 mmol/L   CO2 24 22 - 32 mmol/L   Glucose, Bld 80 70 - 99 mg/dL   BUN 60 (H) 8 - 23 mg/dL   Creatinine, Ser 1.75 (H) 0.44 - 1.00 mg/dL   Calcium 9.4 8.9 - 10.3 mg/dL   Total Protein 8.0 6.5 - 8.1 g/dL   Albumin 3.9 3.5 - 5.0 g/dL   AST 28 15 - 41 U/L   ALT 21 0 - 44 U/L   Alkaline Phosphatase 89 38 - 126 U/L   Total Bilirubin 0.7 0.3 - 1.2 mg/dL   GFR calc non Af Amer 25 (L) >60 mL/min   GFR calc Af Amer 29 (L) >60 mL/min   Anion gap 9 5 - 15    Comment: Performed at [facility], [place] [address], [place], [place] [address]  Magnesium     Status: None   Collection Time: 05/18/19  7:25 PM  Result Value Ref Range   Magnesium 2.2 1.7 - 2.4 mg/dL    Comment: Performed at [facility], [place] [address] [place], [place] [address]  Lipase, blood     Status: None   Collection Time: 05/18/19  7:25 PM  Result Value Ref Range   Lipase 22 11 - 51 U/L    Comment: Performed at [facility], [place] [address] [place], [place] [address]  Urinalysis, Routine w reflex microscopic     Status: Abnormal   Collection Time: 05/18/19  7:25 PM  Result Value Ref Range   Color, Urine YELLOW YELLOW   APPearance HAZY (A) CLEAR   Specific Gravity, Urine 1.011 1.005 - 1.030   pH 8.0 5.0 - 8.0   Glucose, UA NEGATIVE NEGATIVE mg/dL  Hgb urine dipstick NEGATIVE NEGATIVE   Bilirubin Urine NEGATIVE NEGATIVE   Ketones, ur NEGATIVE NEGATIVE mg/dL   Protein, ur NEGATIVE NEGATIVE mg/dL   Nitrite NEGATIVE NEGATIVE   Leukocytes,Ua NEGATIVE NEGATIVE   RBC / HPF 0-5 0 - 5 RBC/hpf   WBC, UA 0-5 0 - 5 WBC/hpf   Bacteria, UA NONE SEEN NONE SEEN   Hyaline Casts, UA PRESENT     Comment: Performed at [facility] , [place] [address], [place], [place] [address]  Protime-INR     Status: Abnormal   Collection Time: 05/18/19  7:25 PM  Result Value Ref Range   Prothrombin Time 19.9 (H) 11.4 - 15.2  seconds   INR 1.7 (H) 0.8 - 1.2    Comment: (NOTE) INR goal varies based on device and disease states. Performed at [facility], [place] [address] [place], [place] [address]   CBG monitoring, ED     Status: None   Collection Time: 05/18/19  8:06 PM  Result Value Ref Range   Glucose-Capillary 78 70 - 99 mg/dL   *Note: Due to a large number of results and/or encounters for the requested time period, some results have not been displayed. A complete set of results can be found in Results Review.   CT Head Wo Contrast  Result Date: 05/18/2019 CLINICAL DATA:  Syncopal episode, weakness EXAM: CT HEAD WITHOUT CONTRAST TECHNIQUE: Contiguous axial images were obtained from the base of the skull through the vertex without intravenous contrast. COMPARISON:  11/24/2018 head CT, 11/02/2016 FINDINGS: Brain: No acute territorial infarction, hemorrhage or intracranial mass. Moderate atrophy. Patchy white matter hypodensity consistent with chronic small vessel ischemic change. Chronic left greater than right cerebellar infarcts. Vascular: No hyperdense vessels. Carotid and vertebral vascular calcification Skull: No depressed skull fracture. Prominent degenerative change in pannus about the C1-C2 articulation, chronic Sinuses/Orbits: No acute finding. Other: None IMPRESSION: 1. No CT evidence for acute intracranial abnormality. 2. Atrophy and chronic small vessel ischemic change of the white matter Electronically Signed   By: Donavan Foil M.D.   On: 05/18/2019 20:07   DG Chest Portable 1 View  Result Date: 05/18/2019 CLINICAL DATA:  Weakness syncopal episode EXAM: PORTABLE CHEST 1 VIEW COMPARISON:  11/24/2018 FINDINGS: No focal airspace disease or pleural effusion. Mild cardiomegaly. Atelectasis left base. Aortic atherosclerosis. No pneumothorax. IMPRESSION: No active disease.  Mild cardiomegaly. Electronically Signed   By: Donavan Foil M.D.   On: 05/18/2019 20:02    Pending Labs Unresulted  Labs (From admission, onward)    Start     Ordered   05/19/19 2458  Basic metabolic panel  Tomorrow morning,   R     05/18/19 2224   05/19/19 0500  Protime-INR  Daily,   R     05/18/19 2240   05/18/19 2242  D-dimer, quantitative (not at [facility])  Add-on,   AD     05/18/19 2241   05/18/19 1926  SARS CORONAVIRUS 2 (TAT 6-24 HRS) Nasopharyngeal Nasopharyngeal Swab  (Tier 3 (TAT 6-24 hrs))  Once,   STAT    Question Answer Comment  Is this test for diagnosis or screening Screening   Symptomatic for COVID-19 as defined by CDC No   Hospitalized for COVID-19 No   Admitted to ICU for COVID-19 No   Previously tested for COVID-19 Yes   Resident in a congregate (group) care setting No   Employed in healthcare setting No   Pregnant No      05/18/19 1926  Vitals/Pain Today's Vitals   05/18/19 2100 05/18/19 2130 05/18/19 2200 05/18/19 2230  BP: (!) 170/70 (!) 174/67 (!) 152/61 (!) 158/69  Pulse: 66 67 61 61  Resp: 16 11 13 13   Temp:      TempSrc:      SpO2: 100% 100% 99% 100%  PainSc:        Isolation Precautions No active isolations  Medications Medications  sodium chloride flush (NS) 0.9 % injection 3 mL (has no administration in time range)  acetaminophen (TYLENOL) tablet 650 mg (has no administration in time range)    Or  acetaminophen (TYLENOL) suppository 650 mg (has no administration in time range)  0.9 %  sodium chloride infusion (has no administration in time range)  insulin aspart (novoLOG) injection 0-9 Units (has no administration in time range)  insulin aspart (novoLOG) injection 0-5 Units (has no administration in time range)  ipratropium-albuterol (DUONEB) 0.5-2.5 (3) MG/3ML nebulizer solution 3 mL (has no administration in time range)  hydrALAZINE (APRESOLINE) injection 5 mg (has no administration in time range)  Warfarin - Pharmacist Dosing Inpatient (has no administration in time range)  aspirin chewable tablet 324 mg (324 mg Oral Given 05/18/19 2002)     Mobility walks with device

## 2019-05-18 NOTE — H&P (Addendum)
History and Physical    Shannon [name] ZOX:[id number] DOB: 09-06-1923 DOA: 05/18/2019  PCP: Sharon Seller, NP Patient coming from: Home  Chief Complaint: Syncope  HPI: Shannon [name] is a 86 y.o. female with medical history significant of asthma, COPD, paroxysmal A. fib/flutter, CAD, chronic diastolic congestive heart failure, CKD stage III, history of DVT, GERD, paraesophageal hernia, perforated gastric ulcer, SBO, pyloric stenosis status post J-tube, hypertension, hyperlipidemia, hypothyroidism, PAD, SIADH, CVA, and conditions listed below presenting to the ED after an episode of slurred/abnormal speech followed by syncope.  Family reported that during the event the patient's oxygen saturation dropped to 76% and they placed her on 2 L supplemental oxygen which brought up her oxygen to 96%.  Patient wears oxygen at night.  Family also reported that patient had pain in her chest earlier today which resolved after she vomited.  Thought possibly gas.  She was doing well later, getting her hair done when she had a syncopal episode.  Lasted a few minutes.  Seemed to return to baseline by the time EMS arrived.  Patient states this morning she had breakfast and then went to her room to read her Bible.  She then started experiencing substernal chest discomfort which she describes as a choking sensation.  She then spit up some food.  Chest discomfort resolved and she went on with her day.  Later in the afternoon her granddaughter came to braid her hair and she experienced substernal chest discomfort which again felt like a choking sensation.  She felt nauseous.  States she then passed out and does not remember what happened.  States she is normally able to eat and swallow food on her own but does get tube feeds at night.  ED Course: Blood pressure elevated with systolic in the 170s on arrival.  Afebrile.  Not tachypneic or hypoxic.  Not tachycardic.  Labs showing no leukocytosis.  Hemoglobin 10.0, at  baseline.  BUN 60, creatinine 1.7.  Baseline creatinine 1.2-1.4.  Lipase and LFTs normal.  Magnesium level normal.  INR 1.7.  Initial high-sensitivity troponin 18, repeat pending.  EKG not suggestive of acute ischemic changes or arrhythmia.  UA not suggestive of infection.  SARS-CoV-2 PCR test pending.  Chest x-ray showing mild cardiomegaly and no active disease.  Head CT negative for acute intracranial abnormality.  Patient received aspirin 324 mg.  Review of Systems:  All systems reviewed and apart from history of presenting illness, are negative.  Past Medical History:  Diagnosis Date  . Acute respiratory failure (HCC)   . Anemia    previous blood transfusions  . Arthritis    "all over"  . Asthma   . Atrial flutter (HCC)   . Bradycardia    requiring previous d/c of BB and reduction of amiodarone  . CAD (coronary artery disease)    nonobstructive per notes  . Chronic diastolic CHF (congestive heart failure) (HCC)   . CKD (chronic kidney disease), stage III   . Complication of blood transfusion    "got the wrong blood type at [facility] in ~ 2015; no adverse reaction that we are aware of"/daughter, [name] (01/27/2016)  . COPD (chronic obstructive pulmonary disease) (HCC)   . Depression    "light case"  . DVT (deep venous thrombosis) (HCC) 01/2016   a. LLE DVT 01/2016 - switched from Eliquis to Coumadin.  [name] Dyspnea    with some activity  . Gastric stenosis    a. s/p stomach tube  . GERD (gastroesophageal  reflux disease)   . History of blood transfusion    "several" (01/27/2016)  . History of stomach ulcers   . Hyperlipidemia   . Hypertension   . Hypothyroidism   . On home oxygen therapy    "2L; 9PM til 9AM" (06/27/2017)  . PAD (peripheral artery disease) (HCC)    a. prior gangrene, toe amputations, intervention.  [name] PAF (paroxysmal atrial fibrillation) (HCC)   . Paraesophageal hernia   . Perforated gastric ulcer (HCC)   . Pneumonia    "a few times" (06/27/2017)  . Seasonal  allergies   . SIADH (syndrome of inappropriate ADH production) (HCC)    [name] 01/10/2015  . Small bowel obstruction (HCC)    "I don't know how many" (01/11/2015)  . Stroke [facility])    several- no residual  . Type II diabetes mellitus (HCC)    "related to prednisone use  for > 20 yr; once predinose stopped; no more DM RX" (01/27/2016)  . UTI (urinary tract infection) 02/08/2016  . Ventral hernia with bowel obstruction     Past Surgical History:  Procedure Laterality Date  . ABDOMINAL AORTOGRAM N/A 09/21/2016   Procedure: Abdominal Aortogram;  Surgeon: Maeola Harman, MD;  Location: [facility] INVASIVE CV LAB;  Service: Cardiovascular;  Laterality: N/A;  . AMPUTATION Left 09/25/2016   Procedure: AMPUTATION DIGIT- LEFT 2ND AND 3RD TOES;  Surgeon: Larina Earthly, MD;  Location: [facility] OR;  Service: Vascular;  Laterality: Left;  . AMPUTATION Right 05/03/2018   Procedure: AMPUTATION RIGHT GREAT TOE;  Surgeon: Larina Earthly, MD;  Location: [facility] OR;  Service: Vascular;  Laterality: Right;  . CATARACT EXTRACTION W/ INTRAOCULAR LENS  IMPLANT, BILATERAL    . CHOLECYSTECTOMY OPEN    . COLECTOMY    . ESOPHAGOGASTRODUODENOSCOPY N/A 01/19/2014   Procedure: ESOPHAGOGASTRODUODENOSCOPY (EGD);  Surgeon: Hilarie Fredrickson, MD;  Location: [facility] ENDOSCOPY;  Service: Endoscopy;  Laterality: N/A;  . ESOPHAGOGASTRODUODENOSCOPY N/A 01/20/2014   Procedure: ESOPHAGOGASTRODUODENOSCOPY (EGD);  Surgeon: Hilarie Fredrickson, MD;  Location: [facility] ENDOSCOPY;  Service: Endoscopy;  Laterality: N/A;  . ESOPHAGOGASTRODUODENOSCOPY N/A 03/19/2014   Procedure: ESOPHAGOGASTRODUODENOSCOPY (EGD);  Surgeon: Rachael Fee, MD;  Location: [facility] ENDOSCOPY;  Service: Endoscopy;  Laterality: N/A;  . ESOPHAGOGASTRODUODENOSCOPY N/A 07/08/2015   Procedure: ESOPHAGOGASTRODUODENOSCOPY (EGD);  Surgeon: Sherrilyn Rist, MD;  Location: [facility] ENDOSCOPY;  Service: Endoscopy;  Laterality: N/A;  . ESOPHAGOGASTRODUODENOSCOPY (EGD) WITH PROPOFOL N/A 09/15/2015   Procedure:  ESOPHAGOGASTRODUODENOSCOPY (EGD) WITH PROPOFOL;  Surgeon: Ruffin Frederick, MD;  Location: [facility] ENDOSCOPY;  Service: Gastroenterology;  Laterality: N/A;  . GASTROJEJUNOSTOMY     hx/notes 01/10/2015  . GASTROJEJUNOSTOMY N/A 09/23/2015   Procedure: OPEN GASTROJEJUNOSTOMY TUBE PLACEMENT;  Surgeon: De Blanch Kinsinger, MD;  Location: [facility] ORS;  Service: General;  Laterality: N/A;  . GLAUCOMA SURGERY Bilateral   . HERNIA REPAIR  2015  . IR CM INJ ANY COLONIC TUBE W/FLUORO  01/05/2017  . IR CM INJ ANY COLONIC TUBE W/FLUORO  06/07/2017  . IR CM INJ ANY COLONIC TUBE W/FLUORO  11/05/2017  . IR CM INJ ANY COLONIC TUBE W/FLUORO  09/18/2018  . IR CM INJ ANY COLONIC TUBE W/FLUORO  03/06/2019  . IR CM INJ ANY COLONIC TUBE W/FLUORO  03/11/2019  . IR GENERIC HISTORICAL  01/07/2016   IR GJ TUBE CHANGE 01/07/2016 Malachy Moan, MD [facility]  . IR GENERIC HISTORICAL  01/27/2016   IR MECH REMOV OBSTRUC MAT ANY COLON TUBE W/FLUORO 01/27/2016 Richarda Overlie, MD [facility]  . IR GENERIC  HISTORICAL  02/07/2016   IR PATIENT EVAL TECH 0-60 MINS Darrell K Allred, PA-C [facility]  . IR GENERIC HISTORICAL  02/08/2016   IR GJ TUBE CHANGE 02/08/2016 Berdine Dance, MD [facility]  . IR GENERIC HISTORICAL  01/06/2016   IR GJ TUBE CHANGE 01/06/2016 [facility] OUT REF  . IR GENERIC HISTORICAL  05/02/2016   IR CM INJ ANY COLONIC TUBE W/FLUORO 05/02/2016 Oley Balm, MD [facility]  . IR GENERIC HISTORICAL  05/15/2016   IR GJ TUBE CHANGE 05/15/2016 Simonne Come, MD [facility]  . IR GENERIC HISTORICAL  06/28/2016   IR GJ TUBE CHANGE 06/28/2016 [facility]  . IR GJ TUBE CHANGE  02/20/2017  . IR GJ TUBE CHANGE  05/10/2017  . IR GJ TUBE CHANGE  07/06/2017  . IR GJ TUBE CHANGE  08/02/2017  . IR GJ TUBE CHANGE  10/15/2017  . IR GJ TUBE CHANGE  12/26/2017  . IR GJ TUBE CHANGE  04/08/2018  . IR GJ TUBE CHANGE  06/19/2018  . IR GJ TUBE CHANGE  03/03/2019  . IR PATIENT EVAL TECH 0-60 MINS  10/19/2016  . IR PATIENT EVAL TECH 0-60 MINS   12/25/2016  . IR PATIENT EVAL TECH 0-60 MINS  01/29/2017  . IR PATIENT EVAL TECH 0-60 MINS  04/04/2017  . IR PATIENT EVAL TECH 0-60 MINS  04/30/2017  . IR PATIENT EVAL TECH 0-60 MINS  07/31/2017  . IR REPLC DUODEN/JEJUNO TUBE PERCUT W/FLUORO  11/14/2016  . LAPAROTOMY N/A 01/20/2015   Procedure: EXPLORATORY LAPAROTOMY;  Surgeon: Abigail Miyamoto, MD;  Location: [facility] OR;  Service: General;  Laterality: N/A;  . LOWER EXTREMITY ANGIOGRAPHY Left 09/21/2016   Procedure: Lower Extremity Angiography;  Surgeon: Maeola Harman, MD;  Location: [facility] INVASIVE CV LAB;  Service: Cardiovascular;  Laterality: Left;  . LOWER EXTREMITY ANGIOGRAPHY Right 06/27/2017   Procedure: Lower Extremity Angiography;  Surgeon: Maeola Harman, MD;  Location: [facility] INVASIVE CV LAB;  Service: Cardiovascular;  Laterality: Right;  . LYSIS OF ADHESION N/A 01/20/2015   Procedure: LYSIS OF ADHESIONS < 1 HOUR;  Surgeon: Abigail Miyamoto, MD;  Location: [facility] OR;  Service: General;  Laterality: N/A;  . PERIPHERAL VASCULAR BALLOON ANGIOPLASTY Left 09/21/2016   Procedure: Peripheral Vascular Balloon Angioplasty;  Surgeon: Maeola Harman, MD;  Location: [facility] INVASIVE CV LAB;  Service: Cardiovascular;  Laterality: Left;  drug coated balloon  . PERIPHERAL VASCULAR BALLOON ANGIOPLASTY Right 06/27/2017   Procedure: PERIPHERAL VASCULAR BALLOON ANGIOPLASTY;  Surgeon: Maeola Harman, MD;  Location: [facility] INVASIVE CV LAB;  Service: Cardiovascular;  Laterality: Right;  SFA/TPTRUNK  . TONSILLECTOMY    . TUBAL LIGATION    . VENTRAL HERNIA REPAIR  2015   incarcerated ventral hernia ([facility] 09/2013)/notes 01/10/2015     reports that she has never smoked. She quit smokeless tobacco use about 39 years ago.  Her smokeless tobacco use included snuff. She reports that she does not drink alcohol or use drugs.  Allergies  Allergen Reactions  . Penicillins Itching, Rash and Other (See Comments)    Tolerated amoxicillin, unasyn, zosyn &  cephalosporins in the past. Did it involve swelling of the face/tongue/throat, SOB, or low BP? No Did it involve sudden or severe rash/hives, skin peeling, or any reaction on the inside of your mouth or nose? No Did you need to seek medical attention at a hospital or doctor's office? Unknown When did it last happen?5+ years If all above answers are "NO", may proceed with cephalosporin use.  Family History  Problem Relation Age of Onset  . Stroke Mother   . Hypertension Mother   . Diabetes Brother   . Glaucoma Son   . Glaucoma Son   . Heart attack Neg Hx   . Colon cancer Neg Hx   . Stomach cancer Neg Hx     Prior to Admission medications   Medication Sig Start Date End Date Taking? Authorizing Provider  acetaminophen (TYLENOL) 500 MG tablet Take 500 mg by mouth every 6 (six) hours as needed (pain).     [provider]  Amino Acids-Protein Hydrolys (FEEDING SUPPLEMENT, PRO-STAT SUGAR FREE 64,) LIQD Take 30 mLs by mouth 2 (two) times daily.    [provider]  amLODipine (NORVASC) 5 MG tablet Take 1 tablet (5 mg total) by mouth daily. 11/26/18 11/26/19  Marzetta Board, MD  atorvastatin (LIPITOR) 10 MG tablet Take 1 tablet (10 mg total) by mouth daily. 09/25/18   Sharon Seller, NP  b complex vitamins tablet Take 1 tablet by mouth daily.     [provider]  calcium-vitamin D (OSCAL WITH D) 500-200 MG-UNIT per tablet Take 1 tablet by mouth daily with breakfast.     [provider]  Carboxymethylcellulose Sodium (ARTIFICIAL TEARS OP) Place 1 drop into both eyes 4 (four) times daily.    [provider]  collagenase (SANTYL) ointment Apply 1 application topically daily as needed (wound care).     [provider]  cycloSPORINE (RESTASIS) 0.05 % ophthalmic emulsion USE 1 DROP INTO BOTH EYES TWICE DAILY 01/29/17   Kirt Boys, DO  DULERA 200-5 MCG/ACT AERO TAKE TWO PUFFS BY MOUTH TWICE DAILY 01/13/19   Sharon Seller, NP  feeding supplement (OSMOLITE 1 CAL) LIQD Take 711 mLs by mouth See admin instructions. For 12 hours 2100 - 0900    [provider]  ferrous sulfate 325 (65 FE) MG tablet TAKE 1 TABLET BY MOUTH EVERY DAY 04/30/19   Sharon Seller, NP  fluticasone (FLONASE) 50 MCG/ACT nasal spray SPRAY 2 SPRAYS INTO EACH NOSTRIL EVERY DAY 02/06/19   Sharon Seller, NP  furosemide (LASIX) 20 MG tablet Take 20 mg by mouth See admin instructions. Daily unless swelling occurs take 40 mg tablet and then switch back to 20 mg daily    [provider]  gabapentin (NEURONTIN) 300 MG capsule TAKE 1 CAPSULE BY MOUTH TWICE A DAY 02/06/19   Sharon Seller, NP  gentamicin cream (GARAMYCIN) 0.1 % Apply 1 application topically 3 (three) times daily as needed (dry skin). 04/23/19   Felecia Shelling, DPM  hydrALAZINE (APRESOLINE) 50 MG tablet TAKE 1 TABLET BY MOUTH THREE TIMES A DAY 02/06/19   Lars Masson, MD  insulin aspart (NOVOLOG) 100 UNIT/ML FlexPen Inject 0-6 Units into the skin 3 (three) times daily with meals. New sliding Scale If BS is 151 - 251 = 2 units,  If BS is 251 -  350 = 4 units,  If BS is 351 - 450 = 6 units Call provider if BS runs more than 400 02/03/19   Sharon Seller, NP  Insulin Glargine (LANTUS SOLOSTAR) 100 UNIT/ML Solostar Pen Inject 8 Units into the skin at bedtime. 02/20/19   Sharon Seller, NP  Insulin Pen Needle (B-D UF III MINI PEN NEEDLES) 31G X 5 MM MISC Use twice daily with giving insulin injections. Dx E11.9 07/17/18   Sharon Seller, NP  ipratropium-albuterol (DUONEB) 0.5-2.5 (3) MG/3ML SOLN Take 3 mLs  by nebulization every 6 (six) hours as needed (sob). Use 3 times daily x 4 days then every 6 hours as needed. 03/21/16   Janetta Hora, PA-C  isosorbide mononitrate (IMDUR) 30 MG 24 hr tablet TAKE 2 TABLETS BY MOUTH DAILY. PLEASE MAKE ANNUAL APPT WITH DR. Delton See FOR FUTURE REFILLS. THANK YOU. 1ST ATTEMPT. 12/27/18   Lars Masson, MD  latanoprost  (XALATAN) 0.005 % ophthalmic solution Place 1 drop into both eyes at bedtime. 06/02/14   Oneal Grout, MD  levalbuterol [facility] HFA) 45 MCG/ACT inhaler Inhale 2 puffs into the lungs every 6 (six) hours as needed for wheezing or shortness of breath. 02/03/16   Kirt Boys, DO  levothyroxine (SYNTHROID) 125 MCG tablet TAKE 1 TABLET BY MOUTH EVERY DAY 03/18/19   Sharon Seller, NP  loratadine (CLARITIN) 10 MG tablet Take 10 mg by mouth daily as needed for allergies.    [provider]  Multiple Vitamin (MULTIVITAMIN WITH MINERALS) TABS tablet Take 1 tablet by mouth daily.    [provider]  nystatin (MYCOSTATIN/NYSTOP) powder Apply 1 g topically as needed (skin irritation).     [provider]  oxyCODONE (ROXICODONE) 5 MG immediate release tablet Take 1 tablet (5 mg total) by mouth every 4 (four) hours as needed for severe pain. May take 1/2 tab q4hrs as needed for mild pain. 06/14/18   Sharon Seller, NP  OXYGEN Inhale 2 L into the lungs at bedtime.    [provider]  pantoprazole (PROTONIX) 40 MG tablet TAKE 1 TABLET BY MOUTH TWICE A DAY 12/24/18   Sharon Seller, NP  sertraline (ZOLOFT) 50 MG tablet TAKE 1 TABLET BY MOUTH EVERY DAY 05/09/19   Sharon Seller, NP  Vitamins/Minerals TABS Take by mouth.    [provider]  warfarin (COUMADIN) 2.5 MG tablet Take 2-3 tablets by mouth daily as directed by Coumadin clinic. 03/24/19   Lars Masson, MD  Water For Irrigation, Sterile (FREE WATER) SOLN Place 150 mLs into feeding tube 3 (three) times daily.     [provider]  White Petrolatum-Mineral Oil (SYSTANE NIGHTTIME) OINT Place 1 application into both eyes at bedtime.     [provider]    Physical Exam: Vitals:   05/18/19 2006 05/18/19 2030 05/18/19 2100 05/18/19 2130  BP: (!) 185/71 (!) 166/67 (!) 170/70 (!) 174/67  Pulse: 79 67 66 67  Resp: 17 14 16 11   Temp:      TempSrc:      SpO2: 100% 100% 100% 100%     Physical Exam  Constitutional: She is oriented to person, place, and time. She appears well-developed and well-nourished. No distress.  HENT:  Head: Normocephalic.  Eyes: Right eye exhibits no discharge. Left eye exhibits no discharge.  Cardiovascular: Normal rate, regular rhythm and intact distal pulses.  Pulmonary/Chest: Effort normal and breath sounds normal. No respiratory distress. She has no wheezes. She has no rales.  Abdominal: Soft. Bowel sounds are normal. She exhibits no distension. There is abdominal tenderness. There is no rebound and no guarding.  Mild epigastric tenderness  Musculoskeletal:        General: No edema.     Cervical back: Neck supple.  Neurological: She is alert and oriented to person, place, and time. No cranial nerve deficit.  No focal motor or sensory deficit.  Skin: Skin is warm and dry. She is not diaphoretic.     Labs on Admission: I have personally reviewed following labs and  imaging studies  CBC: Recent Labs  Lab 05/18/19 1925  WBC 7.0  HGB 10.0*  HCT 29.9*  MCV 92.3  PLT 200   Basic Metabolic Panel: Recent Labs  Lab 05/18/19 1925  NA 134*  K 4.3  CL 101  CO2 24  GLUCOSE 80  BUN 60*  CREATININE 1.75*  CALCIUM 9.4  MG 2.2   GFR: CrCl cannot be calculated (Unknown ideal weight.). Liver Function Tests: Recent Labs  Lab 05/18/19 1925  AST 28  ALT 21  ALKPHOS 89  BILITOT 0.7  PROT 8.0  ALBUMIN 3.9   Recent Labs  Lab 05/18/19 1925  LIPASE 22   No results for input(s): AMMONIA in the last 168 hours. Coagulation Profile: Recent Labs  Lab 05/16/19 1320 05/18/19 1925  INR 2.1 1.7*   Cardiac Enzymes: No results for input(s): CKTOTAL, CKMB, CKMBINDEX, TROPONINI in the last 168 hours. BNP (last 3 results) No results for input(s): PROBNP in the last 8760 hours. HbA1C: No results for input(s): HGBA1C in the last 72 hours. CBG: Recent Labs  Lab 05/18/19 2006  GLUCAP 78   Lipid Profile: No results for  input(s): CHOL, HDL, LDLCALC, TRIG, CHOLHDL, LDLDIRECT in the last 72 hours. Thyroid Function Tests: No results for input(s): TSH, T4TOTAL, FREET4, T3FREE, THYROIDAB in the last 72 hours. Anemia Panel: No results for input(s): VITAMINB12, FOLATE, FERRITIN, TIBC, IRON, RETICCTPCT in the last 72 hours. Urine analysis:    Component Value Date/Time   COLORURINE YELLOW 05/18/2019 1925   APPEARANCEUR HAZY (A) 05/18/2019 1925   LABSPEC 1.011 05/18/2019 1925   PHURINE 8.0 05/18/2019 1925   GLUCOSEU NEGATIVE 05/18/2019 1925   HGBUR NEGATIVE 05/18/2019 1925   BILIRUBINUR NEGATIVE 05/18/2019 1925   BILIRUBINUR Neg 11/01/2017 1228   KETONESUR NEGATIVE 05/18/2019 1925   PROTEINUR NEGATIVE 05/18/2019 1925   UROBILINOGEN 0.2 11/01/2017 1228   UROBILINOGEN 0.2 01/27/2015 2308   NITRITE NEGATIVE 05/18/2019 1925   LEUKOCYTESUR NEGATIVE 05/18/2019 1925    Radiological Exams on Admission: CT Head Wo Contrast  Result Date: 05/18/2019 CLINICAL DATA:  Syncopal episode, weakness EXAM: CT HEAD WITHOUT CONTRAST TECHNIQUE: Contiguous axial images were obtained from the base of the skull through the vertex without intravenous contrast. COMPARISON:  11/24/2018 head CT, 11/02/2016 FINDINGS: Brain: No acute territorial infarction, hemorrhage or intracranial mass. Moderate atrophy. Patchy white matter hypodensity consistent with chronic small vessel ischemic change. Chronic left greater than right cerebellar infarcts. Vascular: No hyperdense vessels. Carotid and vertebral vascular calcification Skull: No depressed skull fracture. Prominent degenerative change in pannus about the C1-C2 articulation, chronic Sinuses/Orbits: No acute finding. Other: None IMPRESSION: 1. No CT evidence for acute intracranial abnormality. 2. Atrophy and chronic small vessel ischemic change of the white matter Electronically Signed   By: Jasmine Pang M.D.   On: 05/18/2019 20:07   DG Chest Portable 1 View  Result Date: 05/18/2019 CLINICAL  DATA:  Weakness syncopal episode EXAM: PORTABLE CHEST 1 VIEW COMPARISON:  11/24/2018 FINDINGS: No focal airspace disease or pleural effusion. Mild cardiomegaly. Atelectasis left base. Aortic atherosclerosis. No pneumothorax. IMPRESSION: No active disease.  Mild cardiomegaly. Electronically Signed   By: Jasmine Pang M.D.   On: 05/18/2019 20:02    EKG: Independently reviewed.  Sinus rhythm, no significant change since prior tracing.  Assessment/Plan Principal Problem:   Syncope Active Problems:   Type 2 diabetes mellitus with chronic kidney disease (HCC)   Accelerated hypertension   COPD with asthma (HCC)   AKI (acute kidney injury) (HCC)   Syncope -  Brief episode of slurred speech reported by family.  Neuro exam currently nonfocal and patient's speech is back to her baseline.  Head CT negative for acute intracranial abnormality. -Patient reported chest pain.  However, ACS less likely given EKG without acute ischemic changes and no significant elevation of troponin.  High-sensitivity troponin 18, continue to trend.  Received full dose aspirin. -EKG not suggestive of arrhythmia.  Continue cardiac monitoring -Echocardiogram to assess for structural heart disease -PE is also on the differential as family reported brief episode of hypoxia.  Currently not hypoxic or tachycardic.  Chronically anticoagulated with Coumadin, however, INR mildly subtherapeutic at 1.7.  Check D-dimer level.  If elevated, will need VQ scan in the morning to assess for PE.  Unable to do CT angiogram due to renal insufficiency. -Carotid Dopplers -EEG -Orthostatics -Fall precautions  AKI on CKD stage III BUN 60, creatinine 1.7.  Baseline creatinine 1.2-1.4.  Suspect prerenal due to possible dehydration and home diuretic use. -IV fluid hydration -Continue to monitor renal function and urine output -Avoid nephrotoxic agents/contrast  COPD/asthma -Stable.  No bronchospasm.  Combivent inhaler as needed.  Paroxysmal A.  fib fib/flutter -Stable.  Currently in sinus rhythm.  Continue Coumadin for anticoagulation.  History of DVT -Continue Coumadin for anticoagulation dosing per pharmacy.  Monitor PT/INR.  Well controlled insulin-dependent diabetes A1c 6.2 on 03/02/2019. -Sliding scale insulin sensitive ACHS and CBG checks.  Hypertension Blood pressure elevated with systolic in the 170s. -Hydralazine as needed for SBP greater than 170 -Checking orthostatics given syncopal episode  History of pyloric stenosis status post J-tube Patient reports choking and spitting up food. States she normally eats and swallows food and tube feeds are only done at night. -Keep n.p.o. -SLP eval -Abdominal x-ray to assess positioning of J-tube  Pharmacy med rec pending.  DVT prophylaxis: Coumadin Code Status: Patient wishes to be DNR. Family Communication: No family available at this time. Disposition Plan: Anticipate discharge after clinical improvement. Consults called: None Admission status: It is my clinical opinion that referral for OBSERVATION is reasonable and necessary in this patient based on the above information provided. The aforementioned taken together are felt to place the patient at high risk for further clinical deterioration. However it is anticipated that the patient may be medically stable for discharge from the hospital within 24 to 48 hours.  The medical decision making on this patient was of high complexity and the patient is at high risk for clinical deterioration, therefore this is a level 3 visit.  John Giovanni MD [facility]  If 7PM-7AM, please contact night-coverage [email or web address] Password [facility]  05/18/2019, 10:41 PM

## 2019-05-19 ENCOUNTER — Observation Stay (HOSPITAL_COMMUNITY)
Admit: 2019-05-19 | Discharge: 2019-05-19 | Disposition: A | Discharge status: Home / Self Care | Payer: Medicare Other | Attending: Internal Medicine | Admitting: Internal Medicine

## 2019-05-19 ENCOUNTER — Observation Stay (HOSPITAL_BASED_OUTPATIENT_CLINIC_OR_DEPARTMENT_OTHER): Payer: Medicare Other

## 2019-05-19 DIAGNOSIS — N179 Acute kidney failure, unspecified: Secondary | ICD-10-CM

## 2019-05-19 DIAGNOSIS — R55 Syncope and collapse: Secondary | ICD-10-CM | POA: Diagnosis not present

## 2019-05-19 DIAGNOSIS — J449 Chronic obstructive pulmonary disease, unspecified: Secondary | ICD-10-CM

## 2019-05-19 DIAGNOSIS — N1832 Chronic kidney disease, stage 3b: Secondary | ICD-10-CM | POA: Diagnosis not present

## 2019-05-19 DIAGNOSIS — Z794 Long term (current) use of insulin: Secondary | ICD-10-CM

## 2019-05-19 DIAGNOSIS — I10 Essential (primary) hypertension: Secondary | ICD-10-CM

## 2019-05-19 DIAGNOSIS — E1121 Type 2 diabetes mellitus with diabetic nephropathy: Secondary | ICD-10-CM | POA: Diagnosis not present

## 2019-05-19 LAB — D-DIMER, QUANTITATIVE: D-Dimer, Quant: 0.99 ug/mL-FEU — ABNORMAL HIGH (ref 0.00–0.50)

## 2019-05-19 LAB — PROTIME-INR
INR: 1.5 — ABNORMAL HIGH (ref 0.8–1.2)
INR: 1.7 — ABNORMAL HIGH (ref 0.8–1.2)
Prothrombin Time: 18 seconds — ABNORMAL HIGH (ref 11.4–15.2)
Prothrombin Time: 19.6 seconds — ABNORMAL HIGH (ref 11.4–15.2)

## 2019-05-19 LAB — GLUCOSE, CAPILLARY
Glucose-Capillary: 117 mg/dL — ABNORMAL HIGH (ref 70–99)
Glucose-Capillary: 74 mg/dL (ref 70–99)
Glucose-Capillary: 81 mg/dL (ref 70–99)
Glucose-Capillary: 90 mg/dL (ref 70–99)

## 2019-05-19 LAB — SARS CORONAVIRUS 2 (TAT 6-24 HRS): SARS Coronavirus 2: NEGATIVE

## 2019-05-19 LAB — BASIC METABOLIC PANEL
Anion gap: 8 (ref 5–15)
BUN: 60 mg/dL — ABNORMAL HIGH (ref 8–23)
CO2: 24 mmol/L (ref 22–32)
Calcium: 9.6 mg/dL (ref 8.9–10.3)
Chloride: 103 mmol/L (ref 98–111)
Creatinine, Ser: 1.72 mg/dL — ABNORMAL HIGH (ref 0.44–1.00)
GFR calc Af Amer: 29 mL/min — ABNORMAL LOW (ref >60)
GFR calc non Af Amer: 25 mL/min — ABNORMAL LOW (ref >60)
Glucose, Bld: 82 mg/dL (ref 70–99)
Potassium: 4.5 mmol/L (ref 3.5–5.1)
Sodium: 135 mmol/L (ref 135–145)

## 2019-05-19 LAB — ECHOCARDIOGRAM COMPLETE
Height: 66 in
Weight: 2162.27 oz

## 2019-05-19 MED ORDER — WARFARIN SODIUM 5 MG PO TABS
7.5000 mg | ORAL_TABLET | Freq: Once | ORAL | Status: AC
  Administered 2019-05-19: 7.5 mg via ORAL
  Filled 2019-05-19: qty 1

## 2019-05-19 NOTE — TOC Progression Note (Signed)
Transition of Care [facility]) - Progression Note    Patient Details  Name: Shannon [name] MRN: [id number] Date of Birth: 03-17-1924  Transition of [facility] CM/SW Contact  Purcell Mouton, RN Phone Number: 05/19/2019, 4:52 PM  Clinical Narrative:     Pt states at present time she will wait on HHPT until she see her family doctor.        Expected Discharge Plan and Services           Expected Discharge Date: 05/19/19                                     Social Determinants of Health (SDOH) Interventions    Readmission Risk Interventions No flowsheet data found.

## 2019-05-19 NOTE — Progress Notes (Signed)
Carotid artery duplex has been completed. Preliminary results can be found in CV Proc through chart review.   05/19/19 9:08 AM Shannon [name] RVT

## 2019-05-19 NOTE — Discharge Summary (Signed)
Physician Discharge Summary  Shannon [name] IHK:[id number] DOB: 04/01/1924 DOA: 05/18/2019  PCP: Sharon Seller, NP  Admit date: 05/18/2019 Discharge date: 05/19/2019  Admitted From: Home  Discharge disposition: Home with PT  Recommendations for Outpatient Follow-Up:   . Follow up with your primary care provider in one week.  . Check CBC, BMP in the next visit . Continue physical therapy, occupational therapy at home. . Take precautions during swallowing.  Discharge Diagnosis:   Principal Problem: Presyncope Active Problems:   Type 2 diabetes mellitus with chronic kidney disease (HCC)   Accelerated hypertension   COPD with asthma (HCC)   AKI (acute kidney injury) (HCC)    Discharge Condition: Improved.  Diet recommendation: puree  Wound care: None.  Code status: Full.   History of Present Illness:   Shannon [name] is a 86 y.o. female with medical history significant of asthma, COPD, paroxysmal A. fib/flutter, CAD, chronic diastolic congestive heart failure, CKD stage III, history of DVT, GERD, paraesophageal hernia, perforated gastric ulcer, SBO, pyloric stenosis status post J-tube, hypertension, hyperlipidemia, hypothyroidism, PAD, SIADH, CVA,presented to the ED after an episode of slurred/abnormal speech followed by syncope.  Family reported that during the event the patient's oxygen saturation dropped to 76% and they placed her on 2 L supplemental oxygen which brought up her oxygen to 96%.  Patient wears oxygen at night.  Family also reported that patient had pain in her chest earlier today which resolved after she vomited.  Thought possibly gas.    ED Course: Blood pressure elevated with systolic in the 170s on arrival.  Afebrile.  Not tachypneic or hypoxic.  Not tachycardic.  Labs showed no leukocytosis.  Hemoglobin 10.0, at baseline.  BUN 60, creatinine 1.7.  Baseline creatinine 1.2-1.4.  Lipase and LFTs normal.  Magnesium level normal.  INR 1.7.  Initial  high-sensitivity troponin 18.  EKG was not suggestive of acute ischemic changes or arrhythmia.  UA not suggestive of infection.  SARS-CoV-2 PCR test pending.  Chest x-ray showed mild cardiomegaly and no active disease.  Head CT negative for acute intracranial abnormality.   Hospital Course:   Following conditions were addressed during hospitalization as listed below,  Syncope/Near syncope.  -Patient was monitored overnight.  No arrhythmias.  2D echocardiogram showed preserved LV function.  Likely vasovagal episode.  CT head scan was negative.  EEG was unremarkable.  EKG was unremarkable.  Carotid duplex ultrasound was negative for hemodynamically significant stenosis.  Patient was seen by physical therapy and recommended home PT on discharge.  Atypical chest pain.  Patient does have difficulty swallowing given her prior history.  Patient was not hypoxic tachycardic and was on Coumadin so low suspicion for PE.  Mild AKI on CKD stage III   Baseline creatinine 1.2-1.4.  Suspect prerenal due to possible dehydration and home diuretic use.  Received IV fluid hydration during hospitalization.  Will need outpatient follow-up  COPD/asthma Was continued on inhaler  Paroxysmal A. fib fib/flutter Rate controlled in in sinus rhythm.  Continue Coumadin for anticoagulation.  History of DVT On Coumadin.  Continue..  Well controlled insulin-dependent diabetes A1c 6.2 on 03/02/2019.  Continue home regimen on discharge  Essential hypertension Resume home medication  History of pyloric stenosis status post J-tube Concern for dysphagia.  Seen by speech therapy.  Advised to continue to use J-tube.   Disposition.  At this time, patient is stable for disposition.  Spoke with the patient's daughter on the phone regarding disposition.  Medical Consultants:  None.  Procedures:    EEG Subjective:   Today, patient feels okay communicative.  Concerns for dysphagia.  Discharge Exam:    Vitals:   05/19/19 1329 05/19/19 1331  BP: (!) 171/62 (!) 159/67  Pulse: 62 (!) 58  Resp: 14   Temp: 97.9 F (36.6 C)   SpO2: 100%    Vitals:   05/19/19 0524 05/19/19 1118 05/19/19 1329 05/19/19 1331  BP: (!) 162/61 (!) 157/63 (!) 171/62 (!) 159/67  Pulse: (!) 58 65 62 (!) 58  Resp: 18 18 14    Temp: 98.1 F (36.7 C) 97.8 F (36.6 C) 97.9 F (36.6 C)   TempSrc: Oral     SpO2: 100% 100% 100%   Weight: 61.3 kg     Height:       General: Alert awake, not in obvious distress, thinly built HENT: pupils equally reacting to light,  No scleral pallor or icterus noted. Oral mucosa is moist.  Chest: Diminished breath sounds..  CVS: S1 &S2 heard. No murmur.  Regular rate and rhythm. Abdomen: Soft, nontender, nondistended.  Bowel sounds are heard.  G-tube in place Extremities: No cyanosis, clubbing or edema.  Peripheral pulses are palpable. Psych: Alert, awake and oriented, normal mood CNS:  No cranial nerve deficits.  Power equal in all extremities.   Skin: Warm and dry.  No rashes noted.  The results of significant diagnostics from this hospitalization (including imaging, microbiology, ancillary and laboratory) are listed below for reference.     Diagnostic Studies:   EEG adult  Result Date: 05/19/2019 Charlsie Quest, MD     05/19/2019  4:10 PM Patient Name: Shannon [name] MRN: [id number] Epilepsy Attending: Charlsie Quest Referring Physician/Provider: Dr. John Giovanni Date: 05/19/2019 Duration: 25.56 minutes Patient history: 86 year old female who presented with syncope-like episode as well as brief episode of slurred speech.  EEG evaluate for seizures. Level of alertness: Awake AEDs during EEG study: None Technical aspects: This EEG study was done with scalp electrodes positioned according to the 10-20 International system of electrode placement. Electrical activity was acquired at a sampling rate of 500Hz  and reviewed with a high frequency filter of 70Hz  and a low frequency  filter of 1Hz . EEG data were recorded continuously and digitally stored. Description: The posterior dominant rhythm consists of 8-9Hz  activity of moderate voltage (25-35 uV) seen predominantly in posterior head regions, symmetric and reactive to eye opening and eye closing.  Physiologic photic driving was not seen during photic stimulation.  Hyperventilation was not performed.  Of note, EEG was technically difficult due to significant electrode and myogenic artifact. IMPRESSION: This technically difficult study is within normal limits. No seizures or epileptiform discharges were seen throughout the recording. Charlsie Quest   ECHOCARDIOGRAM COMPLETE  Result Date: 05/19/2019    ECHOCARDIOGRAM REPORT   Patient Name:   Shannon [name] Date of Exam: 05/19/2019 Medical Rec #:  [id number]     Height:       66.0 in Accession #:    [id number]    Weight:       135.1 lb Date of Birth:  [birth year]     BSA:          1.69 m Patient Age:    86 years      BP:           162/61 mmHg Patient Gender: F             HR:  64 bpm. Exam Location:  Inpatient Procedure: 2D Echo, Color Doppler and Cardiac Doppler Indications:    R55 Syncope  History:        Patient has prior history of Echocardiogram examinations, most                 recent 11/25/2018. CHF, COPD, Arrythmias:Atrial Fibrillation,                 Signs/Symptoms:Syncope; Risk Factors:Hypertension, Diabetes and                 Dyslipidemia.  Sonographer:    Irving Burton Senior RDCS Referring Phys: [id number] John Giovanni  Sonographer Comments: Technically difficult due to small rib spacing. IMPRESSIONS  1. Left ventricular ejection fraction, by estimation, is 65 to 70%. The left ventricle has normal function. The left ventricle has no regional wall motion abnormalities. There is mild concentric left ventricular hypertrophy. Left ventricular diastolic parameters are consistent with Grade I diastolic dysfunction (impaired relaxation).  2. Right ventricular systolic function is  normal. The right ventricular size is normal. There is normal pulmonary artery systolic pressure. The estimated right ventricular systolic pressure is 31.4 mmHg.  3. The mitral valve is degenerative. Trivial mitral valve regurgitation.  4. The aortic valve has an indeterminant number of cusps. Aortic valve regurgitation is trivial. Mild aortic valve sclerosis is present, with no evidence of aortic valve stenosis.  5. The inferior vena cava is normal in size with <50% respiratory variability, suggesting right atrial pressure of 8 mmHg. Comparison(s): A prior study was performed on 11/25/2018. No significant change from prior study. FINDINGS  Left Ventricle: Left ventricular ejection fraction, by estimation, is 65 to 70%. The left ventricle has normal function. The left ventricle has no regional wall motion abnormalities. The left ventricular internal cavity size was small. There is mild concentric left ventricular hypertrophy. Left ventricular diastolic parameters are consistent with Grade I diastolic dysfunction (impaired relaxation). Right Ventricle: The right ventricular size is normal. No increase in right ventricular wall thickness. Right ventricular systolic function is normal. There is normal pulmonary artery systolic pressure. The tricuspid regurgitant velocity is 2.42 m/s, and  with an assumed right atrial pressure of 8 mmHg, the estimated right ventricular systolic pressure is 31.4 mmHg. Left Atrium: Left atrial size was normal in size. Right Atrium: Right atrial size was normal in size. Pericardium: Trivial pericardial effusion is present. Presence of pericardial fat pad. Mitral Valve: The mitral valve is degenerative in appearance. Mild mitral annular calcification. Trivial mitral valve regurgitation. Tricuspid Valve: The tricuspid valve is grossly normal. Tricuspid valve regurgitation is not demonstrated. Aortic Valve: The aortic valve has an indeterminant number of cusps. Aortic valve regurgitation is  trivial. Mild aortic valve sclerosis is present, with no evidence of aortic valve stenosis. Pulmonic Valve: The pulmonic valve was grossly normal. Pulmonic valve regurgitation is not visualized. Aorta: The aortic root and ascending aorta are structurally normal, with no evidence of dilitation. Venous: The inferior vena cava is normal in size with less than 50% respiratory variability, suggesting right atrial pressure of 8 mmHg. IAS/Shunts: No atrial level shunt detected by color flow Doppler.  LEFT VENTRICLE PLAX 2D LVIDd:         3.00 cm  Diastology LVIDs:         2.10 cm  LV e' lateral:   4.79 cm/s LV PW:         1.20 cm  LV E/e' lateral: 11.6 LV IVS:  1.20 cm  LV e' medial:    6.31 cm/s LVOT diam:     1.70 cm  LV E/e' medial:  8.8 LV SV:         39.72 ml LV SV Index:   12.18 LVOT Area:     2.27 cm  RIGHT VENTRICLE RV S prime:     13.10 cm/s TAPSE (M-mode): 1.9 cm LEFT ATRIUM             Index       RIGHT ATRIUM           Index LA diam:        3.50 cm 2.07 cm/m  RA Area:     14.30 cm LA Vol (A2C):   46.7 ml 27.59 ml/m RA Volume:   31.00 ml  18.31 ml/m LA Vol (A4C):   44.2 ml 26.11 ml/m LA Biplane Vol: 45.6 ml 26.94 ml/m  AORTIC VALVE LVOT Vmax:   71.10 cm/s LVOT Vmean:  50.700 cm/s LVOT VTI:    0.175 m  AORTA Ao Root diam: 2.90 cm Ao Asc diam:  2.70 cm MITRAL VALVE               TRICUSPID VALVE MV Area (PHT): 2.54 cm    TR Peak grad:   23.4 mmHg MV Decel Time: 299 msec    TR Vmax:        242.00 cm/s MV E velocity: 55.70 cm/s MV A velocity: 80.60 cm/s  SHUNTS MV E/A ratio:  0.69        Systemic VTI:  0.18 m                            Systemic Diam: 1.70 cm Lennie Odor MD Electronically signed by Lennie Odor MD Signature Date/Time: 05/19/2019/3:14:11 PM    Final    VAS US CAROTID  Result Date: 05/19/2019 Carotid Arterial Duplex Study Indications:       Syncope. Risk Factors:      Hypertension, Diabetes. Limitations        Today's exam was limited due to the high bifurcation of the                     carotid, the body habitus of the patient, the patient's                    respiratory variation and patient anatomy. Comparison Study:  01/08/2019 - 1-39% ICA stenosis bilaterally. Performing Technologist: Chanda Busing RVT  Examination Guidelines: A complete evaluation includes B-mode imaging, spectral Doppler, color Doppler, and power Doppler as needed of all accessible portions of each vessel. Bilateral testing is considered an integral part of a complete examination. Limited examinations for reoccurring indications may be performed as noted.  Right Carotid Findings: +----------+--------+--------+--------+-----------------------+--------+           PSV cm/sEDV cm/sStenosisPlaque Description     Comments +----------+--------+--------+--------+-----------------------+--------+ CCA Prox  94      13              smooth and heterogenoustortuous +----------+--------+--------+--------+-----------------------+--------+ CCA Distal77      9               smooth and heterogenous         +----------+--------+--------+--------+-----------------------+--------+ ICA Prox  75      12              smooth and heterogenous         +----------+--------+--------+--------+-----------------------+--------+  ICA Distal85      19                                     tortuous +----------+--------+--------+--------+-----------------------+--------+ ECA       84      0                                               +----------+--------+--------+--------+-----------------------+--------+ +----------+--------+-------+--------+-------------------+           PSV cm/sEDV cmsDescribeArm Pressure (mmHG) +----------+--------+-------+--------+-------------------+ [id number]                                         +----------+--------+-------+--------+-------------------+ +---------+--------+--+--------+--+---------+ VertebralPSV cm/s50EDV cm/s10Antegrade  +---------+--------+--+--------+--+---------+  Left Carotid Findings: +----------+--------+--------+--------+-----------------------+--------+           PSV cm/sEDV cm/sStenosisPlaque Description     Comments +----------+--------+--------+--------+-----------------------+--------+ CCA Prox  104     14              smooth and heterogenous         +----------+--------+--------+--------+-----------------------+--------+ CCA Distal82      11              smooth and heterogenous         +----------+--------+--------+--------+-----------------------+--------+ ICA Prox  58      14              smooth and heterogenoustortuous +----------+--------+--------+--------+-----------------------+--------+ ICA Distal91      15                                     tortuous +----------+--------+--------+--------+-----------------------+--------+ ECA       57      8                                               +----------+--------+--------+--------+-----------------------+--------+ +----------+--------+--------+--------+-------------------+           PSV cm/sEDV cm/sDescribeArm Pressure (mmHG) +----------+--------+--------+--------+-------------------+ [id number]                                          +----------+--------+--------+--------+-------------------+ +---------+--------+--+--------+--+---------+ VertebralPSV cm/s42EDV cm/s10Antegrade +---------+--------+--+--------+--+---------+   Summary: Right Carotid: Velocities in the right ICA are consistent with a 1-39% stenosis. Left Carotid: Velocities in the left ICA are consistent with a 1-39% stenosis. Vertebrals: Bilateral vertebral arteries demonstrate antegrade flow. *See table(s) above for measurements and observations.     Preliminary      Labs:   Basic Metabolic Panel: Recent Labs  Lab 05/18/19 1925 05/18/19 2355  NA 134* 135  K 4.3 4.5  CL 101 103  CO2 24 24  GLUCOSE 80 82  BUN 60* 60*    CREATININE 1.75* 1.72*  CALCIUM 9.4 9.6  MG 2.2  --    GFR Estimated Creatinine Clearance: 19.1 mL/min (A) (by C-G formula based on SCr of 1.72 mg/dL (H)). Liver Function Tests: Recent Labs  Lab 05/18/19 1925  AST  28  ALT 21  ALKPHOS 89  BILITOT 0.7  PROT 8.0  ALBUMIN 3.9   Recent Labs  Lab 05/18/19 1925  LIPASE 22   No results for input(s): AMMONIA in the last 168 hours. Coagulation profile Recent Labs  Lab 05/16/19 1320 05/18/19 1925 05/18/19 2355 05/19/19 0229  INR 2.1 1.7* 1.5* 1.7*    CBC: Recent Labs  Lab 05/18/19 1925  WBC 7.0  HGB 10.0*  HCT 29.9*  MCV 92.3  PLT 200   Cardiac Enzymes: No results for input(s): CKTOTAL, CKMB, CKMBINDEX, TROPONINI in the last 168 hours. BNP: Invalid input(s): POCBNP CBG: Recent Labs  Lab 05/18/19 2006 05/19/19 0041 05/19/19 0746 05/19/19 1123  GLUCAP 78 81 74 90   D-Dimer Recent Labs    05/19/19 0229  DDIMER 0.99*   Hgb A1c No results for input(s): HGBA1C in the last 72 hours. Lipid Profile No results for input(s): CHOL, HDL, LDLCALC, TRIG, CHOLHDL, LDLDIRECT in the last 72 hours. Thyroid function studies No results for input(s): TSH, T4TOTAL, T3FREE, THYROIDAB in the last 72 hours.  Invalid input(s): FREET3 Anemia work up No results for input(s): VITAMINB12, FOLATE, FERRITIN, TIBC, IRON, RETICCTPCT in the last 72 hours. Microbiology Recent Results (from the past 240 hour(s))  SARS CORONAVIRUS 2 (TAT 6-24 HRS) Nasopharyngeal Nasopharyngeal Swab     Status: None   Collection Time: 05/18/19  7:26 PM   Specimen: Nasopharyngeal Swab  Result Value Ref Range Status   SARS Coronavirus 2 NEGATIVE NEGATIVE Final    Comment: (NOTE) SARS-CoV-2 target nucleic acids are NOT DETECTED. The SARS-CoV-2 RNA is generally detectable in upper and lower respiratory specimens during the acute phase of infection. Negative results do not preclude SARS-CoV-2 infection, do not rule out co-infections with other  pathogens, and should not be used as the sole basis for treatment or other patient management decisions. Negative results must be combined with clinical observations, patient history, and epidemiological information. The expected result is Negative. Fact Sheet for Patients: HairSlick.no Fact Sheet for Healthcare Providers: quierodirigir.com This test is not yet approved or cleared by the [place] FDA and  has been authorized for detection and/or diagnosis of SARS-CoV-2 by FDA under an Emergency Use Authorization (EUA). This EUA will remain  in effect (meaning this test can be used) for the duration of the COVID-19 declaration under Section 56 4(b)(1) of the Act, 21 U.S.C. section 360bbb-3(b)(1), unless the authorization is terminated or revoked sooner. Performed at [facility], [address], [place], [place] [address]      Discharge Instructions:   Discharge Instructions    Call MD for:   Complete by: As directed    Worsening symptoms.   Discharge instructions   Complete by: As directed    Follow-up with your primary care physician in 1 week.  Take precautions on swallowing.   Increase activity slowly   Complete by: As directed      Allergies as of 05/19/2019      Reactions   Penicillins Itching, Rash, Other (See Comments)   Tolerated amoxicillin, unasyn, zosyn & cephalosporins in the past. Did it involve swelling of the face/tongue/throat, SOB, or low BP? No Did it involve sudden or severe rash/hives, skin peeling, or any reaction on the inside of your mouth or nose? No Did you need to seek medical attention at a hospital or doctor's office? Unknown When did it last happen?5+ years If all above answers are "NO", may proceed with cephalosporin use.  Medication List    TAKE these medications   acetaminophen 500 MG tablet Commonly known as: TYLENOL Take 500 mg by mouth every 6 (six) hours as  needed (pain).   amLODipine 5 MG tablet Commonly known as: NORVASC Take 1 tablet (5 mg total) by mouth daily.   ARTIFICIAL TEARS OP Place 1 drop into both eyes 4 (four) times daily.   atorvastatin 10 MG tablet Commonly known as: LIPITOR Take 1 tablet (10 mg total) by mouth daily.   b complex vitamins tablet Take 1 tablet by mouth daily.   calcium-vitamin D 500-200 MG-UNIT tablet Commonly known as: OSCAL WITH D Take 1 tablet by mouth daily with breakfast.   cycloSPORINE 0.05 % ophthalmic emulsion Commonly known as: Restasis USE 1 DROP INTO BOTH EYES TWICE DAILY What changed:   how much to take  how to take this  when to take this  additional instructions   Dulera 200-5 MCG/ACT Aero Generic drug: mometasone-formoterol TAKE TWO PUFFS BY MOUTH TWICE DAILY What changed: See the new instructions.   EQL PROBIOTIC COLON SUPPORT PO Take 1 capsule by mouth daily.   feeding supplement (PRO-STAT SUGAR FREE 64) Liqd Take 30 mLs by mouth 2 (two) times daily.   feeding supplement Liqd Take 711 mLs by mouth See admin instructions. For 12 hours 2100 - 0900   ferrous sulfate 325 (65 FE) MG tablet TAKE 1 TABLET BY MOUTH EVERY DAY   fluticasone 50 MCG/ACT nasal spray Commonly known as: FLONASE SPRAY 2 SPRAYS INTO EACH NOSTRIL EVERY DAY What changed: See the new instructions.   free water Soln Place 180 mLs into feeding tube 3 (three) times daily.   furosemide 20 MG tablet Commonly known as: LASIX Take 20-40 mg by mouth See admin instructions. Daily unless swelling occurs take 40 mg tablet and then switch back to 20 mg daily   gabapentin 300 MG capsule Commonly known as: NEURONTIN TAKE 1 CAPSULE BY MOUTH TWICE A DAY   gentamicin cream 0.1 % Commonly known as: GARAMYCIN Apply 1 application topically 3 (three) times daily as needed (dry skin).   hydrALAZINE 50 MG tablet Commonly known as: APRESOLINE TAKE 1 TABLET BY MOUTH THREE TIMES A DAY   insulin aspart 100  UNIT/ML FlexPen Commonly known as: NOVOLOG Inject 0-6 Units into the skin 3 (three) times daily with meals. New sliding Scale If BS is 151 - 251 = 2 units,  If BS is 251 -  350 = 4 units,  If BS is 351 - 450 = 6 units Call provider if BS runs more than 400   Insulin Pen Needle 31G X 5 MM Misc Commonly known as: B-D UF III MINI PEN NEEDLES Use twice daily with giving insulin injections. Dx E11.9   ipratropium-albuterol 0.5-2.5 (3) MG/3ML Soln Commonly known as: DUONEB Take 3 mLs by nebulization every 6 (six) hours as needed (sob). Use 3 times daily x 4 days then every 6 hours as needed. What changed: additional instructions   isosorbide mononitrate 30 MG 24 hr tablet Commonly known as: IMDUR TAKE 2 TABLETS BY MOUTH DAILY. PLEASE MAKE ANNUAL APPT WITH DR. Delton See FOR FUTURE REFILLS. THANK YOU. 1ST ATTEMPT. What changed: See the new instructions.   Lantus SoloStar 100 UNIT/ML Solostar Pen Generic drug: Insulin Glargine Inject 8 Units into the skin at bedtime.   latanoprost 0.005 % ophthalmic solution Commonly known as: XALATAN Place 1 drop into both eyes at bedtime.   levalbuterol 45 MCG/ACT inhaler Commonly known as: Xopenex HFA  Inhale 2 puffs into the lungs every 6 (six) hours as needed for wheezing or shortness of breath.   levothyroxine 125 MCG tablet Commonly known as: SYNTHROID TAKE 1 TABLET BY MOUTH EVERY DAY What changed: when to take this   loratadine 10 MG tablet Commonly known as: CLARITIN Take 10 mg by mouth daily as needed for allergies.   multivitamin with minerals Tabs tablet Take 1 tablet by mouth daily.   nystatin powder Commonly known as: MYCOSTATIN/NYSTOP Apply 1 g topically as needed (skin irritation).   oxyCODONE 5 MG immediate release tablet Commonly known as: Roxicodone Take 1 tablet (5 mg total) by mouth every 4 (four) hours as needed for severe pain. May take 1/2 tab q4hrs as needed for mild pain. What changed:   how much to take  additional  instructions   OXYGEN Inhale 2 L into the lungs at bedtime.   pantoprazole 40 MG tablet Commonly known as: PROTONIX TAKE 1 TABLET BY MOUTH TWICE A DAY   sertraline 50 MG tablet Commonly known as: ZOLOFT TAKE 1 TABLET BY MOUTH EVERY DAY   Systane Nighttime Oint Place 1 application into both eyes at bedtime.   warfarin 2.5 MG tablet Commonly known as: COUMADIN Take as directed. If you are unsure how to take this medication, talk to your nurse or doctor. Original instructions: Take 2-3 tablets by mouth daily as directed by Coumadin clinic. What changed:   how much to take  how to take this  when to take this  additional instructions         Time coordinating discharge: 39 minutes  Signed:  Geovonni Meyerhoff  [facility] 05/19/2019, 4:13 PM

## 2019-05-19 NOTE — Procedures (Signed)
Patient Name: Shannon [name]  MRN: [id number]  Epilepsy Attending: Lora Havens  Referring Physician/Provider: Dr. Shela Leff Date: 05/19/2019 Duration: 25.56 minutes  Patient history: 86 year old female who presented with syncope-like episode as well as brief episode of slurred speech.  EEG evaluate for seizures.  Level of alertness: Awake  AEDs during EEG study: None  Technical aspects: This EEG study was done with scalp electrodes positioned according to the 10-20 International system of electrode placement. Electrical activity was acquired at a sampling rate of 500Hz  and reviewed with a high frequency filter of 70Hz  and a low frequency filter of 1Hz . EEG data were recorded continuously and digitally stored.   Description: The posterior dominant rhythm consists of 8-9Hz  activity of moderate voltage (25-35 uV) seen predominantly in posterior head regions, symmetric and reactive to eye opening and eye closing.  Physiologic photic driving was not seen during photic stimulation.  Hyperventilation was not performed.    Of note, EEG was technically difficult due to significant electrode and myogenic artifact.  IMPRESSION: This technically difficult study is within normal limits. No seizures or epileptiform discharges were seen throughout the recording.  [name] Barbra Sarks

## 2019-05-19 NOTE — Evaluation (Signed)
Occupational Therapy Evaluation Patient Details Name: Shannon [name] MRN: [id number] DOB: 12-21-1923 Today's Date: 05/19/2019    History of Present Illness 86 yo female admitted after having a syncopal episode at home. Hx of asthma, COPD, A fib/flutter, CHF, CKD, CVA, PAD, pyloric stenosis with J tube placement, DM.   Clinical Impression   Patient is a 86 year old female that lives with her daughter in a single level home with ramp entry. Patient has assist with tub transfers and medication management from her daughter otherwise modified independent with use of rollator. Currently patient is min guard for bed mobility, min A for functional transfers to power up/cues for safety and total A for peri care due to limited dynamic standing balance with rolling walker. Upon sitting at EOB pt reports feeling "woozy," BP taken 181/76 however patient tremulous in sitting. Patient reports wooziness throughout session with gradual improvement. Recommend continued acute OT services to maximize patient independence and safety with self care.    Follow Up Recommendations  Home health OT;Supervision/Assistance - 24 hour    Equipment Recommendations  None recommended by OT       Precautions / Restrictions Precautions Precautions: Fall Restrictions Weight Bearing Restrictions: No      Mobility Bed Mobility Overal bed mobility: Needs Assistance Bed Mobility: Supine to Sit;Sit to Supine     Supine to sit: Min guard;HOB elevated Sit to supine: Min guard   General bed mobility comments: min guard for safety, increased time  Transfers Overall transfer level: Needs assistance Equipment used: Rolling walker (2 wheeled) Transfers: Sit to/from Stand Sit to Stand: Min assist Stand pivot transfers: Min assist       General transfer comment: increased time and effort, min A to power up to standing    Balance Overall balance assessment: Needs assistance Sitting-balance support: No upper extremity  supported;Feet supported Sitting balance-Leahy Scale: Good     Standing balance support: Bilateral upper extremity supported;During functional activity Standing balance-Leahy Scale: Poor Standing balance comment: reliant on B UE support                           ADL either performed or assessed with clinical judgement   ADL Overall ADL's : Needs assistance/impaired     Grooming: Set up;Sitting   Upper Body Bathing: Sitting;Set up   Lower Body Bathing: Sitting/lateral leans;Sit to/from stand;Moderate assistance   Upper Body Dressing : Set up;Sitting   Lower Body Dressing: Sitting/lateral leans;Moderate assistance   Toilet Transfer: Minimal assistance;Cueing for safety;Ambulation;BSC Toilet Transfer Details (indicate cue type and reason): verbal cues for body mechanics, min A to power up to standing Toileting- Clothing Manipulation and Hygiene: Total assistance;Sit to/from stand Toileting - Clothing Manipulation Details (indicate cue type and reason): peri care after voiding, decreased standing balance to attempt      Functional mobility during ADLs: Minimal assistance;Cueing for safety;Rolling walker General ADL Comments: patient requiring increased assistance with self care due to weakness, reports feeling "woozy"      Vision Baseline Vision/History: Wears glasses              Pertinent Vitals/Pain Pain Assessment: Faces Faces Pain Scale: Hurts a little bit Pain Location: abdomen Pain Descriptors / Indicators: Discomfort Pain Intervention(s): Limited activity within patient's tolerance     Hand Dominance Right   Extremity/Trunk Assessment Upper Extremity Assessment Upper Extremity Assessment: Generalized weakness   Lower Extremity Assessment Lower Extremity Assessment: Defer to PT evaluation   Cervical /  Trunk Assessment Cervical / Trunk Assessment: Kyphotic   Communication Communication Communication: HOH   Cognition Arousal/Alertness:  Awake/alert Behavior During Therapy: WFL for tasks assessed/performed Overall Cognitive Status: Within Functional Limits for tasks assessed                                     General Comments  pt reports feeling woozy sitting up at EOB, BP reads 181/76 however patient tremulous throughout            Home Living Family/patient expects to be discharged to:: Private residence Living Arrangements: Children Available Help at Discharge: Family;Available 24 hours/day Type of Home: House Home Access: Ramped entrance     Home Layout: One level     Bathroom Shower/Tub: Tub/shower unit;Curtain   Firefighter: Standard Bathroom Accessibility: Yes How Accessible: Accessible via walker Home Equipment: Walker - 4 wheels;Cane - single point;Hand held Careers information officer - 2 wheels;Bedside commode;Tub bench   Additional Comments: patient reports her daughter has a handicap daughter of her own to also care for      Prior Functioning/Environment Level of Independence: Needs assistance  Gait / Transfers Assistance Needed: rollator for short ambulation distances  ADL's / Homemaking Assistance Needed: daughter assists with tub transfers and medicine management            OT Problem List: Decreased strength;Decreased activity tolerance;Impaired balance (sitting and/or standing);Decreased safety awareness      OT Treatment/Interventions: Self-care/ADL training;Therapeutic exercise;Energy conservation;DME and/or AE instruction;Therapeutic activities;Patient/family education;Balance training    OT Goals(Current goals can be found in the care plan section) Acute Rehab OT Goals Patient Stated Goal: to feel better OT Goal Formulation: With patient Time For Goal Achievement: 06/02/19 Potential to Achieve Goals: Good  OT Frequency: Min 2X/week    AM-PAC OT "6 Clicks" Daily Activity     Outcome Measure Help from another person eating meals?: None Help from another person  taking care of personal grooming?: A Little Help from another person toileting, which includes using toliet, bedpan, or urinal?: A Lot Help from another person bathing (including washing, rinsing, drying)?: A Lot Help from another person to put on and taking off regular upper body clothing?: A Little Help from another person to put on and taking off regular lower body clothing?: A Lot 6 Click Score: 16   End of Session Equipment Utilized During Treatment: Rolling walker Nurse Communication: Mobility status  Activity Tolerance: Patient limited by fatigue Patient left: in bed;with call bell/phone within reach;with bed alarm set  OT Visit Diagnosis: Unsteadiness on feet (R26.81);Muscle weakness (generalized) (M62.81)                Time: 2595-6387 OT Time Calculation (min): 24 min Charges:  OT General Charges $OT Visit: 1 Visit OT Evaluation $OT Eval Moderate Complexity: 1 Mod OT Treatments $Self Care/Home Management : 8-22 mins  Myrtie Neither OT OT office: [phone]  Carmelia Roller 05/19/2019, 2:40 PM

## 2019-05-19 NOTE — Progress Notes (Signed)
EEG complete. Results pending.  ?

## 2019-05-19 NOTE — Progress Notes (Signed)
Echo attempted at 8:45, therapy in room.

## 2019-05-19 NOTE — Progress Notes (Signed)
Patient discharged home with daughter, discharge instructions given and explained to patient/daughter and they verbalized understanding, patient denies any distress, skin intact, no wound noted. Accompanied home by daughter.

## 2019-05-19 NOTE — Evaluation (Signed)
Clinical/Bedside Swallow Evaluation Patient Details  Name: Shannon [name] MRN: [id number] Date of Birth: 1923/04/17  Today's Date: 05/19/2019 Time: SLP Start Time (ACUTE ONLY): 4098 SLP Stop Time (ACUTE ONLY): 0859 SLP Time Calculation (min) (ACUTE ONLY): 17 min  Past Medical History:  Past Medical History:  Diagnosis Date  . Acute respiratory failure (HCC)   . Anemia    previous blood transfusions  . Arthritis    "all over"  . Asthma   . Atrial flutter (HCC)   . Bradycardia    requiring previous d/c of BB and reduction of amiodarone  . CAD (coronary artery disease)    nonobstructive per notes  . Chronic diastolic CHF (congestive heart failure) (HCC)   . CKD (chronic kidney disease), stage III   . Complication of blood transfusion    "got the wrong blood type at [facility] in ~ 2015; no adverse reaction that we are aware of"/daughter, [name] (01/27/2016)  . COPD (chronic obstructive pulmonary disease) (HCC)   . Depression    "light case"  . DVT (deep venous thrombosis) (HCC) 01/2016   a. LLE DVT 01/2016 - switched from Eliquis to Coumadin.  [name] Dyspnea    with some activity  . Gastric stenosis    a. s/p stomach tube  . GERD (gastroesophageal reflux disease)   . History of blood transfusion    "several" (01/27/2016)  . History of stomach ulcers   . Hyperlipidemia   . Hypertension   . Hypothyroidism   . On home oxygen therapy    "2L; 9PM til 9AM" (06/27/2017)  . PAD (peripheral artery disease) (HCC)    a. prior gangrene, toe amputations, intervention.  [name] PAF (paroxysmal atrial fibrillation) (HCC)   . Paraesophageal hernia   . Perforated gastric ulcer (HCC)   . Pneumonia    "a few times" (06/27/2017)  . Seasonal allergies   . SIADH (syndrome of inappropriate ADH production) (HCC)    [name] 01/10/2015  . Small bowel obstruction (HCC)    "I don't know how many" (01/11/2015)  . Stroke [facility])    several- no residual  . Type II diabetes mellitus (HCC)    "related to  prednisone use  for > 20 yr; once predinose stopped; no more DM RX" (01/27/2016)  . UTI (urinary tract infection) 02/08/2016  . Ventral hernia with bowel obstruction    Past Surgical History:  Past Surgical History:  Procedure Laterality Date  . ABDOMINAL AORTOGRAM N/A 09/21/2016   Procedure: Abdominal Aortogram;  Surgeon: Maeola Harman, MD;  Location: [facility] INVASIVE CV LAB;  Service: Cardiovascular;  Laterality: N/A;  . AMPUTATION Left 09/25/2016   Procedure: AMPUTATION DIGIT- LEFT 2ND AND 3RD TOES;  Surgeon: Larina Earthly, MD;  Location: [facility] OR;  Service: Vascular;  Laterality: Left;  . AMPUTATION Right 05/03/2018   Procedure: AMPUTATION RIGHT GREAT TOE;  Surgeon: Larina Earthly, MD;  Location: [facility] OR;  Service: Vascular;  Laterality: Right;  . CATARACT EXTRACTION W/ INTRAOCULAR LENS  IMPLANT, BILATERAL    . CHOLECYSTECTOMY OPEN    . COLECTOMY    . ESOPHAGOGASTRODUODENOSCOPY N/A 01/19/2014   Procedure: ESOPHAGOGASTRODUODENOSCOPY (EGD);  Surgeon: Hilarie Fredrickson, MD;  Location: [facility] ENDOSCOPY;  Service: Endoscopy;  Laterality: N/A;  . ESOPHAGOGASTRODUODENOSCOPY N/A 01/20/2014   Procedure: ESOPHAGOGASTRODUODENOSCOPY (EGD);  Surgeon: Hilarie Fredrickson, MD;  Location: [facility];  Service: Endoscopy;  Laterality: N/A;  . ESOPHAGOGASTRODUODENOSCOPY N/A 03/19/2014   Procedure: ESOPHAGOGASTRODUODENOSCOPY (EGD);  Surgeon: Rachael Fee, MD;  Location: [facility] ENDOSCOPY;  Service: Endoscopy;  Laterality: N/A;  . ESOPHAGOGASTRODUODENOSCOPY N/A 07/08/2015   Procedure: ESOPHAGOGASTRODUODENOSCOPY (EGD);  Surgeon: Sherrilyn Rist, MD;  Location: [facility] ENDOSCOPY;  Service: Endoscopy;  Laterality: N/A;  . ESOPHAGOGASTRODUODENOSCOPY (EGD) WITH PROPOFOL N/A 09/15/2015   Procedure: ESOPHAGOGASTRODUODENOSCOPY (EGD) WITH PROPOFOL;  Surgeon: Ruffin Frederick, MD;  Location: [facility] ENDOSCOPY;  Service: Gastroenterology;  Laterality: N/A;  . GASTROJEJUNOSTOMY     hx/notes 01/10/2015  . GASTROJEJUNOSTOMY N/A 09/23/2015    Procedure: OPEN GASTROJEJUNOSTOMY TUBE PLACEMENT;  Surgeon: De Blanch Kinsinger, MD;  Location: [facility] ORS;  Service: General;  Laterality: N/A;  . GLAUCOMA SURGERY Bilateral   . HERNIA REPAIR  2015  . IR CM INJ ANY COLONIC TUBE W/FLUORO  01/05/2017  . IR CM INJ ANY COLONIC TUBE W/FLUORO  06/07/2017  . IR CM INJ ANY COLONIC TUBE W/FLUORO  11/05/2017  . IR CM INJ ANY COLONIC TUBE W/FLUORO  09/18/2018  . IR CM INJ ANY COLONIC TUBE W/FLUORO  03/06/2019  . IR CM INJ ANY COLONIC TUBE W/FLUORO  03/11/2019  . IR GENERIC HISTORICAL  01/07/2016   IR GJ TUBE CHANGE 01/07/2016 Malachy Moan, MD [facility]  . IR GENERIC HISTORICAL  01/27/2016   IR MECH REMOV OBSTRUC MAT ANY COLON TUBE W/FLUORO 01/27/2016 Richarda Overlie, MD [facility]  . IR GENERIC HISTORICAL  02/07/2016   IR PATIENT EVAL TECH 0-60 MINS Darrell K Allred, PA-C [facility]  . IR GENERIC HISTORICAL  02/08/2016   IR GJ TUBE CHANGE 02/08/2016 Berdine Dance, MD [facility]  . IR GENERIC HISTORICAL  01/06/2016   IR GJ TUBE CHANGE 01/06/2016 [facility] OUT REF  . IR GENERIC HISTORICAL  05/02/2016   IR CM INJ ANY COLONIC TUBE W/FLUORO 05/02/2016 Oley Balm, MD [facility]  . IR GENERIC HISTORICAL  05/15/2016   IR GJ TUBE CHANGE 05/15/2016 Simonne Come, MD [facility]  . IR GENERIC HISTORICAL  06/28/2016   IR GJ TUBE CHANGE 06/28/2016 [facility]  . IR GJ TUBE CHANGE  02/20/2017  . IR GJ TUBE CHANGE  05/10/2017  . IR GJ TUBE CHANGE  07/06/2017  . IR GJ TUBE CHANGE  08/02/2017  . IR GJ TUBE CHANGE  10/15/2017  . IR GJ TUBE CHANGE  12/26/2017  . IR GJ TUBE CHANGE  04/08/2018  . IR GJ TUBE CHANGE  06/19/2018  . IR GJ TUBE CHANGE  03/03/2019  . IR PATIENT EVAL TECH 0-60 MINS  10/19/2016  . IR PATIENT EVAL TECH 0-60 MINS  12/25/2016  . IR PATIENT EVAL TECH 0-60 MINS  01/29/2017  . IR PATIENT EVAL TECH 0-60 MINS  04/04/2017  . IR PATIENT EVAL TECH 0-60 MINS  04/30/2017  . IR PATIENT EVAL TECH 0-60 MINS  07/31/2017  . IR REPLC DUODEN/JEJUNO TUBE PERCUT W/FLUORO   11/14/2016  . LAPAROTOMY N/A 01/20/2015   Procedure: EXPLORATORY LAPAROTOMY;  Surgeon: Abigail Miyamoto, MD;  Location: [facility] OR;  Service: General;  Laterality: N/A;  . LOWER EXTREMITY ANGIOGRAPHY Left 09/21/2016   Procedure: Lower Extremity Angiography;  Surgeon: Maeola Harman, MD;  Location: [facility] INVASIVE CV LAB;  Service: Cardiovascular;  Laterality: Left;  . LOWER EXTREMITY ANGIOGRAPHY Right 06/27/2017   Procedure: Lower Extremity Angiography;  Surgeon: Maeola Harman, MD;  Location: [facility] INVASIVE CV LAB;  Service: Cardiovascular;  Laterality: Right;  . LYSIS OF ADHESION N/A 01/20/2015   Procedure: LYSIS OF ADHESIONS < 1 HOUR;  Surgeon: Abigail Miyamoto, MD;  Location: [facility] OR;  Service: General;  Laterality: N/A;  . PERIPHERAL  VASCULAR BALLOON ANGIOPLASTY Left 09/21/2016   Procedure: Peripheral Vascular Balloon Angioplasty;  Surgeon: Maeola Harman, MD;  Location: [facility] INVASIVE CV LAB;  Service: Cardiovascular;  Laterality: Left;  drug coated balloon  . PERIPHERAL VASCULAR BALLOON ANGIOPLASTY Right 06/27/2017   Procedure: PERIPHERAL VASCULAR BALLOON ANGIOPLASTY;  Surgeon: Maeola Harman, MD;  Location: [facility] INVASIVE CV LAB;  Service: Cardiovascular;  Laterality: Right;  SFA/TPTRUNK  . TONSILLECTOMY    . TUBAL LIGATION    . VENTRAL HERNIA REPAIR  2015   incarcerated ventral hernia ([facility] 09/2013)/notes 01/10/2015   HPI:  NATALLIA KAPNER is a 86 y.o. female with medical history significant of asthma, COPD, GERD, pna, paraesophageal  hernia, paroxysmal A. fib/flutter, CAD, congestive heart failure, CKD stage III, history of DVT, perforated gastric ulcer, SBO, pyloric stenosis status post J-tube, hypertension, hyperlipidemia, hypothyroidism, PAD, SIADH, CVA, presenting to the ED after an episode of slurred/abnormal speech followed by syncope. Family also reported that patient had pain in her chest earlier today which resolved after she vomited. Per chart pt stated she had  breakfast  then started experiencing substernal chest discomfort which she describes as a choking sensation and expectorated food. Per chart pt is normally able to eat and swallow food on her own but does get tube feeds at night. CXR No active disease.  Mild cardiomegaly. BSE 05/23/18 rec'd thin liquid only- pt did not consume solids at that time.    Assessment / Plan / Recommendation Clinical Impression  Pt has a history of pharyngeal dysphagia and esophageal related dx including paraesophageal hernia and GERD. She has a J-tube and reports to this therapist that she receives nocturnal tube feeds and drinks/sips during the day. She did state the only solids she eats is jello and chicken noodle soup. No concerns of aspiration with two intervals of multiple cup and straw sips and jello- limited obsevation and cannot detect silent aspiration. Pt did have frequent eructation, globus sensation in chest which is in line with symptoms that led her to this admission. Recommend full liquid diet/thin and pt can eat what she desires with assist from staff to order. Educated her to eat small more frequent amounts, sit upright minimum 45 min after meals. No follow up needed at this time.      SLP Visit Diagnosis: Dysphagia, unspecified (R13.10)    Aspiration Risk  Mild aspiration risk;Moderate aspiration risk    Diet Recommendation Thin liquid;Other (Comment)(full liquid diet)   Liquid Administration via: Cup;Straw Medication Administration: Whole meds with liquid Supervision: Patient able to self feed Compensations: Slow rate;Small sips/bites Postural Changes: Remain upright for at least 30 minutes after po intake;Seated upright at 90 degrees    Other  Recommendations Oral Care Recommendations: Oral care BID   Follow up Recommendations None      Frequency and Duration            Prognosis Barriers to Reach Goals: Other (Comment)      Swallow Study   General HPI: CARLETHA SIENKIEWICZ is a 86 y.o. female  with medical history significant of asthma, COPD, GERD, pna, paraesophageal  hernia, paroxysmal A. fib/flutter, CAD, congestive heart failure, CKD stage III, history of DVT, perforated gastric ulcer, SBO, pyloric stenosis status post J-tube, hypertension, hyperlipidemia, hypothyroidism, PAD, SIADH, CVA, presenting to the ED after an episode of slurred/abnormal speech followed by syncope. Family also reported that patient had pain in her chest earlier today which resolved after she vomited. Per chart pt stated she had breakfast  then  started experiencing substernal chest discomfort which she describes as a choking sensation and expectorated food. Per chart pt is normally able to eat and swallow food on her own but does get tube feeds at night. CXR No active disease.  Mild cardiomegaly. BSE 05/23/18 rec'd thin liquid only- pt did not consume solids at that time.  Type of Study: Bedside Swallow Evaluation Previous Swallow Assessment: (see HPI) Diet Prior to this Study: NPO Temperature Spikes Noted: No Respiratory Status: Room air History of Recent Intubation: No Behavior/Cognition: Alert;Cooperative;Pleasant mood Oral Cavity Assessment: Within Functional Limits Oral Care Completed by SLP: Yes Oral Cavity - Dentition: Edentulous Vision: Functional for self-feeding Self-Feeding Abilities: Able to feed self Patient Positioning: Upright in bed Baseline Vocal Quality: Normal Volitional Cough: Strong Volitional Swallow: Able to elicit    Oral/Motor/Sensory Function Overall Oral Motor/Sensory Function: Within functional limits   Ice Chips Ice chips: Not tested   Thin Liquid Thin Liquid: Within functional limits Presentation: Cup;Straw    Nectar Thick Nectar Thick Liquid: Not tested   Honey Thick Honey Thick Liquid: Not tested   Puree Puree: Within functional limits   Solid     Solid: Not tested      Royce Macadamia 05/19/2019,9:36 AM  Breck Coons Lon[place] [employer].Ed Presenter, broadcasting [phone] Office [phone]

## 2019-05-19 NOTE — Progress Notes (Signed)
Patient has warfarin PO scheduled at 0600, she is currently NPO, she is going to have a swallow evaluation. On call,  Bodenheimer was paged on whether to hold or give medication at this moment. No orders given.

## 2019-05-19 NOTE — Progress Notes (Signed)
ANTICOAGULATION CONSULT NOTE - Initial Consult  Pharmacy Consult for warfarin Indication: atrial fibrillation/DVT  Allergies  Allergen Reactions  . Penicillins Itching, Rash and Other (See Comments)    Tolerated amoxicillin, unasyn, zosyn & cephalosporins in the past. Did it involve swelling of the face/tongue/throat, SOB, or low BP? No Did it involve sudden or severe rash/hives, skin peeling, or any reaction on the inside of your mouth or nose? No Did you need to seek medical attention at a hospital or doctor's office? Unknown When did it last happen?5+ years If all above answers are "NO", may proceed with cephalosporin use.     Patient Measurements: Height: 5\' 6"  (167.6 cm) Weight: 135 lb 9.6 oz (61.5 kg) IBW/kg (Calculated) : 59.3 Heparin Dosing Weight:   Vital Signs: Temp: 98.2 F (36.8 C) (02/15 0000) Temp Source: Oral (02/14 1922) BP: 163/59 (02/15 0000) Pulse Rate: 55 (02/15 0000)  Labs: Recent Labs    05/18/19 1925 05/18/19 2125 05/18/19 2355 05/19/19 0229  HGB 10.0*  --   --   --   HCT 29.9*  --   --   --   PLT 200  --   --   --   LABPROT 19.9*  --  18.0* 19.6*  INR 1.7*  --  1.5* 1.7*  CREATININE 1.75*  --  1.72*  --   TROPONINIHS 18* 17  --   --     Estimated Creatinine Clearance: 19.1 mL/min (A) (by C-G formula based on SCr of 1.72 mg/dL (H)).   Medical History: Past Medical History:  Diagnosis Date  . Acute respiratory failure ([place])   . Anemia    previous blood transfusions  . Arthritis    "all over"  . Asthma   . Atrial flutter ([place])   . Bradycardia    requiring previous d/c of BB and reduction of amiodarone  . CAD (coronary artery disease)    nonobstructive per notes  . Chronic diastolic CHF (congestive heart failure) ([place])   . CKD (chronic kidney disease), stage III   . Complication of blood transfusion    "got the wrong blood type at [facility] in ~ 2015; no adverse reaction that we are aware of"/daughter, [name] (01/27/2016)  .  COPD (chronic obstructive pulmonary disease) ([place])   . Depression    "light case"  . DVT (deep venous thrombosis) ([place]) 01/2016   a. LLE DVT 01/2016 - switched from Eliquis to Coumadin.  [name] Dyspnea    with some activity  . Gastric stenosis    a. s/p stomach tube  . GERD (gastroesophageal reflux disease)   . History of blood transfusion    "several" (01/27/2016)  . History of stomach ulcers   . Hyperlipidemia   . Hypertension   . Hypothyroidism   . On home oxygen therapy    "2L; 9PM til 9AM" (06/27/2017)  . PAD (peripheral artery disease) ([place])    a. prior gangrene, toe amputations, intervention.  [name] PAF (paroxysmal atrial fibrillation) ([place])   . Paraesophageal hernia   . Perforated gastric ulcer ([place])   . Pneumonia    "a few times" (06/27/2017)  . Seasonal allergies   . SIADH (syndrome of inappropriate ADH production) ([place])    [name] 01/10/2015  . Small bowel obstruction ([place])    "I don't know how many" (01/11/2015)  . Stroke [facility])    several- no residual  . Type II diabetes mellitus ([place])    "related to prednisone use  for > 20 yr; once predinose  stopped; no more DM RX" (01/27/2016)  . UTI (urinary tract infection) 02/08/2016  . Ventral hernia with bowel obstruction     Medications:  Medications Prior to Admission  Medication Sig Dispense Refill Last Dose  . acetaminophen (TYLENOL) 500 MG tablet Take 500 mg by mouth every 6 (six) hours as needed (pain).    Past Week at Unknown time  . Amino Acids-Protein Hydrolys (FEEDING SUPPLEMENT, PRO-STAT SUGAR FREE 64,) LIQD Take 30 mLs by mouth 2 (two) times daily.   05/18/2019 at Unknown time  . amLODipine (NORVASC) 5 MG tablet Take 1 tablet (5 mg total) by mouth daily. 30 tablet 11 05/18/2019 at Unknown time  . atorvastatin (LIPITOR) 10 MG tablet Take 1 tablet (10 mg total) by mouth daily. 90 tablet 2 05/18/2019 at Unknown time  . b complex vitamins tablet Take 1 tablet by mouth daily.    05/18/2019 at Unknown time  . calcium-vitamin D  (OSCAL WITH D) 500-200 MG-UNIT per tablet Take 1 tablet by mouth daily with breakfast.    05/18/2019 at Unknown time  . Carboxymethylcellulose Sodium (ARTIFICIAL TEARS OP) Place 1 drop into both eyes 4 (four) times daily.   05/18/2019 at Unknown time  . cycloSPORINE (RESTASIS) 0.05 % ophthalmic emulsion USE 1 DROP INTO BOTH EYES TWICE DAILY (Patient taking differently: Place 1 drop into both eyes 2 (two) times daily. ) 60 mL 3 05/18/2019 at Unknown time  . DULERA 200-5 MCG/ACT AERO TAKE TWO PUFFS BY MOUTH TWICE DAILY (Patient taking differently: Inhale 2 puffs into the lungs in the morning and at bedtime. ) 13 g 5 05/18/2019 at Unknown time  . ferrous sulfate 325 (65 FE) MG tablet TAKE 1 TABLET BY MOUTH EVERY DAY (Patient taking differently: Take 325 mg by mouth daily. ) 90 tablet 1 05/18/2019 at Unknown time  . furosemide (LASIX) 20 MG tablet Take 20-40 mg by mouth See admin instructions. Daily unless swelling occurs take 40 mg tablet and then switch back to 20 mg daily    05/18/2019 at Unknown time  . gabapentin (NEURONTIN) 300 MG capsule TAKE 1 CAPSULE BY MOUTH TWICE A DAY (Patient taking differently: Take 300 mg by mouth 2 (two) times daily. ) 180 capsule 1 05/18/2019 at Unknown time  . gentamicin cream (GARAMYCIN) 0.1 % Apply 1 application topically 3 (three) times daily as needed (dry skin). 15 g 1 Past Month at Unknown time  . hydrALAZINE (APRESOLINE) 50 MG tablet TAKE 1 TABLET BY MOUTH THREE TIMES A DAY (Patient taking differently: Take 50 mg by mouth 3 (three) times daily. ) 270 tablet 1 05/18/2019 at Unknown time  . isosorbide mononitrate (IMDUR) 30 MG 24 hr tablet TAKE 2 TABLETS BY MOUTH DAILY. PLEASE MAKE ANNUAL APPT WITH DR. Meda Coffee FOR FUTURE REFILLS. THANK YOU. 1ST ATTEMPT. (Patient taking differently: Take 60 mg by mouth daily. ) 180 tablet 2 05/18/2019 at Unknown time  . levothyroxine (SYNTHROID) 125 MCG tablet TAKE 1 TABLET BY MOUTH EVERY DAY (Patient taking differently: Take 125 mcg by mouth daily  before breakfast. ) 90 tablet 1 05/18/2019 at Unknown time  . loratadine (CLARITIN) 10 MG tablet Take 10 mg by mouth daily as needed for allergies.   05/18/2019 at Unknown time  . Multiple Vitamin (MULTIVITAMIN WITH MINERALS) TABS tablet Take 1 tablet by mouth daily.   05/18/2019 at Unknown time  . nystatin (MYCOSTATIN/NYSTOP) powder Apply 1 g topically as needed (skin irritation).    Past Week at Unknown time  . oxyCODONE (ROXICODONE) 5 MG  immediate release tablet Take 1 tablet (5 mg total) by mouth every 4 (four) hours as needed for severe pain. May take 1/2 tab q4hrs as needed for mild pain. (Patient taking differently: Take 2.5-5 mg by mouth every 4 (four) hours as needed for severe pain. ) 30 tablet 0 05/17/2019 at Unknown time  . pantoprazole (PROTONIX) 40 MG tablet TAKE 1 TABLET BY MOUTH TWICE A DAY (Patient taking differently: Take 40 mg by mouth 2 (two) times daily. ) 180 tablet 1 05/18/2019 at Unknown time  . Probiotic Product (EQL PROBIOTIC COLON SUPPORT PO) Take 1 capsule by mouth daily.   05/18/2019 at Unknown time  . sertraline (ZOLOFT) 50 MG tablet TAKE 1 TABLET BY MOUTH EVERY DAY (Patient taking differently: Take 50 mg by mouth daily. ) 30 tablet 3 05/18/2019 at Unknown time  . Water For Irrigation, Sterile (FREE WATER) SOLN Place 180 mLs into feeding tube 3 (three) times daily.    05/18/2019 at Unknown time  . feeding supplement (OSMOLITE 1 CAL) LIQD Take 711 mLs by mouth See admin instructions. For 12 hours 2100 - 0900   05/17/2019  . fluticasone (FLONASE) 50 MCG/ACT nasal spray SPRAY 2 SPRAYS INTO EACH NOSTRIL EVERY DAY (Patient taking differently: Place 2 sprays into both nostrils daily. ) 48 mL 1 05/17/2019  . insulin aspart (NOVOLOG) 100 UNIT/ML FlexPen Inject 0-6 Units into the skin 3 (three) times daily with meals. New sliding Scale If BS is 151 - 251 = 2 units,  If BS is 251 -  350 = 4 units,  If BS is 351 - 450 = 6 units Call provider if BS runs more than 400 15 mL 3 05/10/2019  . Insulin  Glargine (LANTUS SOLOSTAR) 100 UNIT/ML Solostar Pen Inject 8 Units into the skin at bedtime. 15 mL 3 05/17/2019  . Insulin Pen Needle (B-D UF III MINI PEN NEEDLES) 31G X 5 MM MISC Use twice daily with giving insulin injections. Dx E11.9 300 each 3   . ipratropium-albuterol (DUONEB) 0.5-2.5 (3) MG/3ML SOLN Take 3 mLs by nebulization every 6 (six) hours as needed (sob). Use 3 times daily x 4 days then every 6 hours as needed. (Patient taking differently: Take 3 mLs by nebulization every 6 (six) hours as needed (sob). ) 360 mL 4 unk  . latanoprost (XALATAN) 0.005 % ophthalmic solution Place 1 drop into both eyes at bedtime. 2.5 mL 12 05/17/2019  . levalbuterol (XOPENEX HFA) 45 MCG/ACT inhaler Inhale 2 puffs into the lungs every 6 (six) hours as needed for wheezing or shortness of breath. 1 Inhaler 12 unk  . OXYGEN Inhale 2 L into the lungs at bedtime.     [name] warfarin (COUMADIN) 2.5 MG tablet Take 2-3 tablets by mouth daily as directed by Coumadin clinic. (Patient taking differently: Take 5-7.5 mg by mouth See admin instructions. 7.5 mg on Sunday and Thursday and all other day 5 mg. Pt takes in the evening) 180 tablet 1 05/17/2019 at 1800  . White Petrolatum-Mineral Oil (SYSTANE NIGHTTIME) OINT Place 1 application into both eyes at bedtime.    05/17/2019   Scheduled:  . insulin aspart  0-5 Units Subcutaneous QHS  . insulin aspart  0-9 Units Subcutaneous TID WC  . sodium chloride flush  3 mL Intravenous Q12H  . warfarin  7.5 mg Oral Once  . Warfarin - Pharmacist Dosing Inpatient   Does not apply q1800    Assessment: Patient with chronic warfarin for hx of afib and DVT.  INR 1.7  on admit and this AM.  Last dose warfarin 2/13.  Goal of Therapy:  INR 2-3    Plan:  Warfarin 7.5mg  at 0600 Daily INR  [name] 05/19/2019,3:59 AM

## 2019-05-19 NOTE — Evaluation (Addendum)
Physical Therapy Evaluation Patient Details Name: Shannon [name] MRN: [id number] DOB: Jan 02, 1924 Today's Date: 05/19/2019   History of Present Illness  86 yo female admitted after having a syncopal episode at home. Hx of asthma, COPD, A fib/flutter, CHF, CKD, CVA, PAD, pyloric stenosis with J tube placement, DM.  Clinical Impression  On eval, pt required Min assist for mobility. Pt presents with general weakness, decreased activity tolerance, and impaired gait and balance. She continues to report some chest discomfort. She is weaker than her baseline. Will continue to follow and progress activity as tolerated.     Follow Up Recommendations Home health PT;Supervision/Assistance - 24 hour    Equipment Recommendations  None recommended by PT    Recommendations for Other Services       Precautions / Restrictions Precautions Precautions: Fall; J tube Restrictions Weight Bearing Restrictions: No      Mobility  Bed Mobility Overal bed mobility: Needs Assistance Bed Mobility: Supine to Sit     Supine to sit: Min assist;HOB elevated     General bed mobility comments: Very small amount of assist initially but pt was then able to complete rest of task  Transfers Overall transfer level: Needs assistance Equipment used: 4-wheeled walker Transfers: Sit to/from [employer] Sit to Stand: Min assist Stand pivot transfers: Min assist       General transfer comment: Increased time and effort for pt. Assist to power up, steady, control descent.  Ambulation/Gait Ambulation/Gait assistance: Min Chemical engineer (Feet): 3 Feet Assistive device: 4-wheeled walker Gait Pattern/deviations: Step-through pattern;Decreased stride length;Trunk flexed     General Gait Details: Assist to steady pt and manage RW. Increased time and effort for pt.  Stairs            Wheelchair Mobility    Modified Rankin (Stroke Patients Only)       Balance Overall balance  assessment: Needs assistance         Standing balance support: Bilateral upper extremity supported Standing balance-Leahy Scale: Poor                               Pertinent Vitals/Pain Pain Assessment: Faces Faces Pain Scale: Hurts a little bit Pain Location: chest Pain Descriptors / Indicators: Discomfort Pain Intervention(s): Limited activity within patient's tolerance;Monitored during session    Home Living Family/patient expects to be discharged to:: Private residence Living Arrangements: Children Available Help at Discharge: Family;Available 24 hours/day Type of Home: House Home Access: Ramped entrance     Home Layout: One level Home Equipment: Walker - 4 wheels;Cane - single point;Hand held Careers information officer - 2 wheels;Bedside commode;Tub bench      Prior Function Level of Independence: Needs assistance   Gait / Transfers Assistance Needed: rollator for short ambulation distances   ADL's / Homemaking Assistance Needed: daughter assists with tub transfers and medicine management        Hand Dominance        Extremity/Trunk Assessment   Upper Extremity Assessment Upper Extremity Assessment: Generalized weakness    Lower Extremity Assessment Lower Extremity Assessment: Generalized weakness    Cervical / Trunk Assessment Cervical / Trunk Assessment: Kyphotic  Communication   Communication: HOH  Cognition Arousal/Alertness: Awake/alert Behavior During Therapy: WFL for tasks assessed/performed Overall Cognitive Status: Within Functional Limits for tasks assessed  General Comments      Exercises     Assessment/Plan    PT Assessment Patient needs continued PT services  PT Problem List Decreased strength;Decreased mobility;Decreased activity tolerance;Decreased balance;Decreased knowledge of use of DME       PT Treatment Interventions DME instruction;Gait training;Therapeutic  activities;Therapeutic exercise;Patient/family education;Balance training;Functional mobility training    PT Goals (Current goals can be found in the Care Plan section)  Acute Rehab PT Goals Patient Stated Goal: to feel better PT Goal Formulation: With patient Time For Goal Achievement: 06/02/19 Potential to Achieve Goals: Fair    Frequency Min 3X/week   Barriers to discharge        Co-evaluation               AM-PAC PT "6 Clicks" Mobility  Outcome Measure Help needed turning from your back to your side while in a flat bed without using bedrails?: A Little Help needed moving from lying on your back to sitting on the side of a flat bed without using bedrails?: A Little Help needed moving to and from a bed to a chair (including a wheelchair)?: A Little Help needed standing up from a chair using your arms (e.g., wheelchair or bedside chair)?: A Little Help needed to walk in hospital room?: A Little Help needed climbing 3-5 steps with a railing? : A Lot 6 Click Score: 17    End of Session   Activity Tolerance: Patient limited by fatigue Patient left: in chair;with call bell/phone within reach;with chair alarm set   PT Visit Diagnosis: Muscle weakness (generalized) (M62.81);Difficulty in walking, not elsewhere classified (R26.2)    Time: 0981-1914 PT Time Calculation (min) (ACUTE ONLY): 25 min   Charges:   PT Evaluation $PT Eval Moderate Complexity: 1 Mod PT Treatments $Therapeutic Activity: 8-22 mins           Faye Ramsay, PT Acute Rehabilitation

## 2019-05-20 ENCOUNTER — Telehealth (INDEPENDENT_AMBULATORY_CARE_PROVIDER_SITE_OTHER): Payer: Medicare Other | Admitting: Family

## 2019-05-20 ENCOUNTER — Telehealth: Payer: Self-pay | Admitting: *Deleted

## 2019-05-20 ENCOUNTER — Encounter: Payer: Self-pay | Admitting: Family

## 2019-05-20 ENCOUNTER — Other Ambulatory Visit: Payer: Self-pay

## 2019-05-20 ENCOUNTER — Telehealth: Payer: Self-pay | Admitting: Nurse Practitioner

## 2019-05-20 VITALS — BP 118/69 | Ht 66.0 in | Wt 135.0 lb

## 2019-05-20 DIAGNOSIS — J4489 Other specified chronic obstructive pulmonary disease: Secondary | ICD-10-CM

## 2019-05-20 DIAGNOSIS — I5032 Chronic diastolic (congestive) heart failure: Secondary | ICD-10-CM

## 2019-05-20 DIAGNOSIS — J449 Chronic obstructive pulmonary disease, unspecified: Secondary | ICD-10-CM

## 2019-05-20 DIAGNOSIS — I110 Hypertensive heart disease with heart failure: Secondary | ICD-10-CM

## 2019-05-20 DIAGNOSIS — Z934 Other artificial openings of gastrointestinal tract status: Secondary | ICD-10-CM | POA: Diagnosis not present

## 2019-05-20 DIAGNOSIS — R55 Syncope and collapse: Secondary | ICD-10-CM | POA: Diagnosis not present

## 2019-05-20 DIAGNOSIS — E1122 Type 2 diabetes mellitus with diabetic chronic kidney disease: Secondary | ICD-10-CM

## 2019-05-20 DIAGNOSIS — I4819 Other persistent atrial fibrillation: Secondary | ICD-10-CM | POA: Diagnosis not present

## 2019-05-20 DIAGNOSIS — Z794 Long term (current) use of insulin: Secondary | ICD-10-CM

## 2019-05-20 DIAGNOSIS — N183 Chronic kidney disease, stage 3 unspecified: Secondary | ICD-10-CM

## 2019-05-20 DIAGNOSIS — E039 Hypothyroidism, unspecified: Secondary | ICD-10-CM | POA: Diagnosis not present

## 2019-05-20 DIAGNOSIS — N1832 Chronic kidney disease, stage 3b: Secondary | ICD-10-CM

## 2019-05-20 DIAGNOSIS — E1121 Type 2 diabetes mellitus with diabetic nephropathy: Secondary | ICD-10-CM | POA: Diagnosis not present

## 2019-05-20 DIAGNOSIS — G5793 Unspecified mononeuropathy of bilateral lower limbs: Secondary | ICD-10-CM

## 2019-05-20 NOTE — Patient Instructions (Addendum)
-   Follow up with [facility] on 05/21/2019 as directed notify provider if CBC/diff and BMP not draw during visit.

## 2019-05-20 NOTE — Progress Notes (Signed)
Cardiology Office Note    Date:  05/21/2019   ID:  Shannon [name], Shannon [name] March 07, 1924, MRN [id number]  PCP:  Sharon Seller, NP  Cardiologist: Tobias Alexander, MD EPS: None  No chief complaint on file.   History of Present Illness:  Shannon [name] is a 86 y.o. female with history of chronic diastolic CHF, paroxysmal atrial fibrillation on Coumadin, gastric outlet obstruction s/p gastrojejunostomy feeding tube nonobstructive CAD, history of CVA, COPD, CKD stage III, insulin-dependent type 2 diabetes, hypertension, hyperlipidemia, anemia of chronic disease, lower extremity PAD s/p bilateral interventions followed by VVS,hypothyroidism, and depression    Patient admitted with syncope 11/24/2018 possible secondary to bradycardia-coreg stopped. Troponins normal, CT negative, echo normal LVEF. Norvasc added for BP control. Not seen by Cardiology. Readmitted 12/26/18 with BRBPR in the setting of constipation felt to be a hemorrhoidal bleed. Hbg stable 10.1 coumadin continued.   Carotid dopplers were stable, 30 day monitor without pauses or arrhthymias, . Crt up 1.61 so lasix was reduced 20 mg daily and legs began to sell. LOV 02/19/19 was getting canned soup/broth. I increased lasix to 20mg /40 mg alternating days.   Patient's feed tube dislodged and she had to have it replaced 03/03/19.  I had a telemedicine visit with the patient 03/12/2019 at which time blood pressure was up and she was struggling to take blood pressure meds at regular times.  She was also getting extra salt as she would eat canned soup. She had some swelling.   Patient was hospitalized after an episode of slurred/abnormal speech followed by syncope.  O2 sats dropped to 76% and family put her on 2 L of oxygen.  She also reported pain in her chest earlier that day which resolved after vomiting.  Blood pressure was elevated in the ER at 170 systolic troponin negative EKG not suggestive of an acute ischemic event, head CT negative for  acute intracranial abnormality, EEG unremarkable, no arrhythmias overnight.  2D echo with normal LV function.  Episode felt to be vasovagal.  Patient says grand daughter was braiding her hair and she became dizzy and had near syncope according to patient and daughter. She was still mumbling but didn't go completely out. Earlier that day she feltt a chest fullness/indigestion and started gagging and spit up something. She recovered and took a shower. Then took her BP and iron tablet and felt full. Has trouble getting iron pill down. Patient had been bent over working on her foot wound and then sat up and grand daughter braiding her hair. Within 20 min she became dizzy and passed out. They felt she was gagging from iron pill.   Past Medical History:  Diagnosis Date  . Acute respiratory failure (HCC)   . Anemia    previous blood transfusions  . Arthritis    "all over"  . Asthma   . Atrial flutter (HCC)   . Bradycardia    requiring previous d/c of BB and reduction of amiodarone  . CAD (coronary artery disease)    nonobstructive per notes  . Chronic diastolic CHF (congestive heart failure) (HCC)   . CKD (chronic kidney disease), stage III   . Complication of blood transfusion    "got the wrong blood type at [facility] in ~ 2015; no adverse reaction that we are aware of"/daughter, [name] (01/27/2016)  . COPD (chronic obstructive pulmonary disease) (HCC)   . Depression    "light case"  . DVT (deep venous thrombosis) (HCC) 01/2016   a. LLE DVT  01/2016 - switched from Eliquis to Coumadin.  [name] Dyspnea    with some activity  . Gastric stenosis    a. s/p stomach tube  . GERD (gastroesophageal reflux disease)   . History of blood transfusion    "several" (01/27/2016)  . History of stomach ulcers   . Hyperlipidemia   . Hypertension   . Hypothyroidism   . On home oxygen therapy    "2L; 9PM til 9AM" (06/27/2017)  . PAD (peripheral artery disease) (HCC)    a. prior gangrene, toe amputations,  intervention.  [name] PAF (paroxysmal atrial fibrillation) (HCC)   . Paraesophageal hernia   . Perforated gastric ulcer (HCC)   . Pneumonia    "a few times" (06/27/2017)  . Seasonal allergies   . SIADH (syndrome of inappropriate ADH production) (HCC)    [name] 01/10/2015  . Small bowel obstruction (HCC)    "I don't know how many" (01/11/2015)  . Stroke [facility])    several- no residual  . Type II diabetes mellitus (HCC)    "related to prednisone use  for > 20 yr; once predinose stopped; no more DM RX" (01/27/2016)  . UTI (urinary tract infection) 02/08/2016  . Ventral hernia with bowel obstruction     Past Surgical History:  Procedure Laterality Date  . ABDOMINAL AORTOGRAM N/A 09/21/2016   Procedure: Abdominal Aortogram;  Surgeon: Maeola Harman, MD;  Location: [facility] INVASIVE CV LAB;  Service: Cardiovascular;  Laterality: N/A;  . AMPUTATION Left 09/25/2016   Procedure: AMPUTATION DIGIT- LEFT 2ND AND 3RD TOES;  Surgeon: Larina Earthly, MD;  Location: [facility] OR;  Service: Vascular;  Laterality: Left;  . AMPUTATION Right 05/03/2018   Procedure: AMPUTATION RIGHT GREAT TOE;  Surgeon: Larina Earthly, MD;  Location: [facility] OR;  Service: Vascular;  Laterality: Right;  . CATARACT EXTRACTION W/ INTRAOCULAR LENS  IMPLANT, BILATERAL    . CHOLECYSTECTOMY OPEN    . COLECTOMY    . ESOPHAGOGASTRODUODENOSCOPY N/A 01/19/2014   Procedure: ESOPHAGOGASTRODUODENOSCOPY (EGD);  Surgeon: Hilarie Fredrickson, MD;  Location: [facility] ENDOSCOPY;  Service: Endoscopy;  Laterality: N/A;  . ESOPHAGOGASTRODUODENOSCOPY N/A 01/20/2014   Procedure: ESOPHAGOGASTRODUODENOSCOPY (EGD);  Surgeon: Hilarie Fredrickson, MD;  Location: [facility] ENDOSCOPY;  Service: Endoscopy;  Laterality: N/A;  . ESOPHAGOGASTRODUODENOSCOPY N/A 03/19/2014   Procedure: ESOPHAGOGASTRODUODENOSCOPY (EGD);  Surgeon: Rachael Fee, MD;  Location: [facility] ENDOSCOPY;  Service: Endoscopy;  Laterality: N/A;  . ESOPHAGOGASTRODUODENOSCOPY N/A 07/08/2015   Procedure: ESOPHAGOGASTRODUODENOSCOPY (EGD);   Surgeon: Sherrilyn Rist, MD;  Location: [facility] ENDOSCOPY;  Service: Endoscopy;  Laterality: N/A;  . ESOPHAGOGASTRODUODENOSCOPY (EGD) WITH PROPOFOL N/A 09/15/2015   Procedure: ESOPHAGOGASTRODUODENOSCOPY (EGD) WITH PROPOFOL;  Surgeon: Ruffin Frederick, MD;  Location: [facility] ENDOSCOPY;  Service: Gastroenterology;  Laterality: N/A;  . GASTROJEJUNOSTOMY     hx/notes 01/10/2015  . GASTROJEJUNOSTOMY N/A 09/23/2015   Procedure: OPEN GASTROJEJUNOSTOMY TUBE PLACEMENT;  Surgeon: De Blanch Kinsinger, MD;  Location: [facility] ORS;  Service: General;  Laterality: N/A;  . GLAUCOMA SURGERY Bilateral   . HERNIA REPAIR  2015  . IR CM INJ ANY COLONIC TUBE W/FLUORO  01/05/2017  . IR CM INJ ANY COLONIC TUBE W/FLUORO  06/07/2017  . IR CM INJ ANY COLONIC TUBE W/FLUORO  11/05/2017  . IR CM INJ ANY COLONIC TUBE W/FLUORO  09/18/2018  . IR CM INJ ANY COLONIC TUBE W/FLUORO  03/06/2019  . IR CM INJ ANY COLONIC TUBE W/FLUORO  03/11/2019  . IR GENERIC HISTORICAL  01/07/2016   IR GJ TUBE CHANGE 01/07/2016 Malachy Moan,  MD [facility]  . IR GENERIC HISTORICAL  01/27/2016   IR MECH REMOV OBSTRUC MAT ANY COLON TUBE W/FLUORO 01/27/2016 Richarda Overlie, MD [facility]  . IR GENERIC HISTORICAL  02/07/2016   IR PATIENT EVAL TECH 0-60 MINS Darrell K Allred, PA-C [facility]  . IR GENERIC HISTORICAL  02/08/2016   IR GJ TUBE CHANGE 02/08/2016 Berdine Dance, MD [facility]  . IR GENERIC HISTORICAL  01/06/2016   IR GJ TUBE CHANGE 01/06/2016 [facility] OUT REF  . IR GENERIC HISTORICAL  05/02/2016   IR CM INJ ANY COLONIC TUBE W/FLUORO 05/02/2016 Oley Balm, MD [facility]  . IR GENERIC HISTORICAL  05/15/2016   IR GJ TUBE CHANGE 05/15/2016 Simonne Come, MD [facility]  . IR GENERIC HISTORICAL  06/28/2016   IR GJ TUBE CHANGE 06/28/2016 [facility]  . IR GJ TUBE CHANGE  02/20/2017  . IR GJ TUBE CHANGE  05/10/2017  . IR GJ TUBE CHANGE  07/06/2017  . IR GJ TUBE CHANGE  08/02/2017  . IR GJ TUBE CHANGE  10/15/2017  . IR GJ TUBE CHANGE  12/26/2017  . IR GJ  TUBE CHANGE  04/08/2018  . IR GJ TUBE CHANGE  06/19/2018  . IR GJ TUBE CHANGE  03/03/2019  . IR PATIENT EVAL TECH 0-60 MINS  10/19/2016  . IR PATIENT EVAL TECH 0-60 MINS  12/25/2016  . IR PATIENT EVAL TECH 0-60 MINS  01/29/2017  . IR PATIENT EVAL TECH 0-60 MINS  04/04/2017  . IR PATIENT EVAL TECH 0-60 MINS  04/30/2017  . IR PATIENT EVAL TECH 0-60 MINS  07/31/2017  . IR REPLC DUODEN/JEJUNO TUBE PERCUT W/FLUORO  11/14/2016  . LAPAROTOMY N/A 01/20/2015   Procedure: EXPLORATORY LAPAROTOMY;  Surgeon: Abigail Miyamoto, MD;  Location: [facility] OR;  Service: General;  Laterality: N/A;  . LOWER EXTREMITY ANGIOGRAPHY Left 09/21/2016   Procedure: Lower Extremity Angiography;  Surgeon: Maeola Harman, MD;  Location: [facility] INVASIVE CV LAB;  Service: Cardiovascular;  Laterality: Left;  . LOWER EXTREMITY ANGIOGRAPHY Right 06/27/2017   Procedure: Lower Extremity Angiography;  Surgeon: Maeola Harman, MD;  Location: [facility] INVASIVE CV LAB;  Service: Cardiovascular;  Laterality: Right;  . LYSIS OF ADHESION N/A 01/20/2015   Procedure: LYSIS OF ADHESIONS < 1 HOUR;  Surgeon: Abigail Miyamoto, MD;  Location: [facility] OR;  Service: General;  Laterality: N/A;  . PERIPHERAL VASCULAR BALLOON ANGIOPLASTY Left 09/21/2016   Procedure: Peripheral Vascular Balloon Angioplasty;  Surgeon: Maeola Harman, MD;  Location: [facility] INVASIVE CV LAB;  Service: Cardiovascular;  Laterality: Left;  drug coated balloon  . PERIPHERAL VASCULAR BALLOON ANGIOPLASTY Right 06/27/2017   Procedure: PERIPHERAL VASCULAR BALLOON ANGIOPLASTY;  Surgeon: Maeola Harman, MD;  Location: [facility] INVASIVE CV LAB;  Service: Cardiovascular;  Laterality: Right;  SFA/TPTRUNK  . TONSILLECTOMY    . TUBAL LIGATION    . VENTRAL HERNIA REPAIR  2015   incarcerated ventral hernia ([facility] 09/2013)/notes 01/10/2015    Current Medications: Current Meds  Medication Sig  . acetaminophen (TYLENOL) 500 MG tablet Take 500 mg by mouth every 6 (six) hours as needed  (pain).   . Amino Acids-Protein Hydrolys (FEEDING SUPPLEMENT, PRO-STAT SUGAR FREE 64,) LIQD Take 30 mLs by mouth 2 (two) times daily.  [name] amLODipine (NORVASC) 5 MG tablet Take 1 tablet (5 mg total) by mouth daily.  [name] atorvastatin (LIPITOR) 10 MG tablet Take 1 tablet (10 mg total) by mouth daily.  [name] b complex vitamins tablet Take 1 tablet by mouth daily.   . calcium-vitamin  D (OSCAL WITH D) 500-200 MG-UNIT per tablet Take 1 tablet by mouth daily with breakfast.   . Carboxymethylcellulose Sodium (ARTIFICIAL TEARS OP) Place 1 drop into both eyes 4 (four) times daily.  . cycloSPORINE (RESTASIS) 0.05 % ophthalmic emulsion USE 1 DROP INTO BOTH EYES TWICE DAILY  . DULERA 200-5 MCG/ACT AERO TAKE TWO PUFFS BY MOUTH TWICE DAILY  . feeding supplement (OSMOLITE 1 CAL) LIQD Take 711 mLs by mouth See admin instructions. For 12 hours 2100 - 0900  . ferrous sulfate 325 (65 FE) MG tablet TAKE 1 TABLET BY MOUTH EVERY DAY  . fluticasone (FLONASE) 50 MCG/ACT nasal spray SPRAY 2 SPRAYS INTO EACH NOSTRIL EVERY DAY  . furosemide (LASIX) 20 MG tablet Take 20-40 mg by mouth See admin instructions. Daily unless swelling occurs take 40 mg tablet and then switch back to 20 mg daily   . gabapentin (NEURONTIN) 300 MG capsule TAKE 1 CAPSULE BY MOUTH TWICE A DAY  . gentamicin cream (GARAMYCIN) 0.1 % Apply 1 application topically 3 (three) times daily as needed (dry skin).  . hydrALAZINE (APRESOLINE) 50 MG tablet TAKE 1 TABLET BY MOUTH THREE TIMES A DAY  . insulin aspart (NOVOLOG) 100 UNIT/ML FlexPen Inject 0-6 Units into the skin 3 (three) times daily with meals. New sliding Scale If BS is 151 - 251 = 2 units,  If BS is 251 -  350 = 4 units,  If BS is 351 - 450 = 6 units Call provider if BS runs more than 400  . Insulin Glargine (LANTUS SOLOSTAR) 100 UNIT/ML Solostar Pen Inject 8 Units into the skin at bedtime.  . Insulin Pen Needle (B-D UF III MINI PEN NEEDLES) 31G X 5 MM MISC Use twice daily with giving insulin injections. Dx  E11.9  . ipratropium-albuterol (DUONEB) 0.5-2.5 (3) MG/3ML SOLN Take 3 mLs by nebulization every 6 (six) hours as needed (sob). Use 3 times daily x 4 days then every 6 hours as needed.  . isosorbide mononitrate (IMDUR) 30 MG 24 hr tablet TAKE 2 TABLETS BY MOUTH DAILY. PLEASE MAKE ANNUAL APPT WITH DR. Delton See FOR FUTURE REFILLS. THANK YOU. 1ST ATTEMPT.  . latanoprost (XALATAN) 0.005 % ophthalmic solution Place 1 drop into both eyes at bedtime.  . levalbuterol (XOPENEX HFA) 45 MCG/ACT inhaler Inhale 2 puffs into the lungs every 6 (six) hours as needed for wheezing or shortness of breath.  . levothyroxine (SYNTHROID) 125 MCG tablet TAKE 1 TABLET BY MOUTH EVERY DAY  . loratadine (CLARITIN) 10 MG tablet Take 10 mg by mouth daily as needed for allergies.  . Multiple Vitamin (MULTIVITAMIN WITH MINERALS) TABS tablet Take 1 tablet by mouth daily.  [name] nystatin (MYCOSTATIN/NYSTOP) powder Apply 1 g topically as needed (skin irritation).   [name] oxyCODONE (ROXICODONE) 5 MG immediate release tablet Take 1 tablet (5 mg total) by mouth every 4 (four) hours as needed for severe pain. May take 1/2 tab q4hrs as needed for mild pain.  . OXYGEN Inhale 2 L into the lungs at bedtime.  . pantoprazole (PROTONIX) 40 MG tablet TAKE 1 TABLET BY MOUTH TWICE A DAY  . Probiotic Product (EQL PROBIOTIC COLON SUPPORT PO) Take 1 capsule by mouth daily.  . sertraline (ZOLOFT) 50 MG tablet TAKE 1 TABLET BY MOUTH EVERY DAY  . warfarin (COUMADIN) 2.5 MG tablet Take 2-3 tablets by mouth daily as directed by Coumadin clinic.  [name] Water For Irrigation, Sterile (FREE WATER) SOLN Place 180 mLs into feeding tube 3 (three) times daily.   [name]  White Petrolatum-Mineral Oil (SYSTANE NIGHTTIME) OINT Place 1 application into both eyes at bedtime.      Allergies:   Penicillins   Social History   Socioeconomic History  . Marital status: Widowed    Spouse name: Not on file  . Number of children: 3  . Years of education: Not on file  . Highest education  level: Not on file  Occupational History  . Not on file  Tobacco Use  . Smoking status: Never Smoker  . Smokeless tobacco: Former Neurosurgeon    Types: Snuff  . Tobacco comment: "used snuff in my younger days"  Substance and Sexual Activity  . Alcohol use: Never    Alcohol/week: 0.0 standard drinks  . Drug use: Never  . Sexual activity: Not Currently  Other Topics Concern  . Not on file  Social History Narrative   Married in [place], lives in one story house with 2 people and no pets   Occupation: ?   Has a living Will, [place], and doesn't have DNR   Was living with Husband (in frail health) in [place] until 09/2013.  After her [facility] admission they both came to live with daughter in [place]   Social Determinants of Health   Financial Resource Strain:   . Difficulty of Paying Living Expenses: Not on file  Food Insecurity:   . Worried About Programme researcher, broadcasting/film/video in the Last Year: Not on file  . Ran Out of Food in the Last Year: Not on file  Transportation Needs:   . Lack of Transportation (Medical): Not on file  . Lack of Transportation (Non-Medical): Not on file  Physical Activity:   . Days of Exercise per Week: Not on file  . Minutes of Exercise per Session: Not on file  Stress:   . Feeling of Stress : Not on file  Social Connections:   . Frequency of Communication with Friends and Family: Not on file  . Frequency of Social Gatherings with Friends and Family: Not on file  . Attends Religious Services: Not on file  . Active Member of Clubs or Organizations: Not on file  . Attends Banker Meetings: Not on file  . Marital Status: Not on file     Family History:  The patient's family history includes Diabetes in her brother; Glaucoma in her son and son; Hypertension in her mother; Stroke in her mother.   ROS:   Please see the history of present illness.    ROS All other systems reviewed and are negative.   PHYSICAL EXAM:   VS:  BP (!) 134/54   Pulse 79   Ht 5'  6" (1.676 m)   Wt 140 lb 12 oz (63.8 kg)   SpO2 99%   BMI 22.72 kg/m   Physical Exam  GEN: Thin, elderly, in no acute distress  Neck: no JVD, carotid bruits, or masses Cardiac:RRR; 2/6 systolic murmur LSB Respiratory:  clear to auscultation bilaterally, normal work of breathing GI: soft, nontender, nondistended, + BS Ext: right foot in boot, minimal swelling  Neuro:  Alert and Oriented x 3 Psych: euthymic mood, full affect  Wt Readings from Last 3 Encounters:  05/21/19 140 lb 12 oz (63.8 kg)  05/20/19 135 lb (61.2 kg)  05/19/19 135 lb 2.3 oz (61.3 kg)       Studies/Labs Reviewed:   EKG:  EKG is not ordered today.    Recent Labs: 10/15/2018: TSH 0.98 11/25/2018: B Natriuretic Peptide 233.9 05/18/2019: ALT 21; BUN  60; Creatinine, Ser 1.72; Hemoglobin 10.0; Magnesium 2.2; Platelets 200; Potassium 4.5; Sodium 135   Lipid Panel    Component Value Date/Time   CHOL 131 10/15/2018 1122   CHOL 126 04/07/2015 1015   TRIG 188 (H) 10/15/2018 1122   HDL 47 (L) 10/15/2018 1122   HDL 69 04/07/2015 1015   CHOLHDL 2.8 10/15/2018 1122   VLDL 26 12/24/2013 0456   LDLCALC 58 10/15/2018 1122    Additional studies/ records that were reviewed today include:  2D echo 05/19/2019 IMPRESSIONS     1. Left ventricular ejection fraction, by estimation, is 65 to 70%. The  left ventricle has normal function. The left ventricle has no regional  wall motion abnormalities. There is mild concentric left ventricular  hypertrophy. Left ventricular diastolic  parameters are consistent with Grade I diastolic dysfunction (impaired  relaxation).   2. Right ventricular systolic function is normal. The right ventricular  size is normal. There is normal pulmonary artery systolic pressure. The  estimated right ventricular systolic pressure is 31.4 mmHg.   3. The mitral valve is degenerative. Trivial mitral valve regurgitation.   4. The aortic valve has an indeterminant number of cusps. Aortic valve    regurgitation is trivial. Mild aortic valve sclerosis is present, with no  evidence of aortic valve stenosis.   5. The inferior vena cava is normal in size with <50% respiratory  variability, suggesting right atrial pressure of 8 mmHg.   Comparison(s): A prior study was performed on 11/25/2018. No significant  change from prior study.   Carotid Dopplers 2/2021Summary:  Right Carotid: Velocities in the right ICA are consistent with a 1-39%  stenosis.   Left Carotid: Velocities in the left ICA are consistent with a 1-39%  stenosis.   Vertebrals: Bilateral vertebral arteries demonstrate antegrade flow.   *See table(s) above for measurements and observations.      ASSESSMENT:    1. Syncope, unspecified syncope type   2. Persistent atrial fibrillation   3. Essential hypertension, benign   4. Chronic diastolic CHF (congestive heart failure) (HCC)   5. History of GI bleed      PLAN:  In order of problems listed above:  Recent hospitalization with syncope most likely vasovagal.  Full work-up was unrevealing echo preserved LV function and unchanged.  Carotid Dopplers no significant stenosis, CT negative. Most of symptoms around swallowing her iron pills and gagging. They are now crushing her pills.  PAF with syncope 11/2018 felt secondary to bradycardia and Coreg stopped.  30-day monitor without significant arrhythmia. NSR on EKG in hospital  Essential hypertension BP controlled.  Chronic diastolic CHF normal LVEF with grade 1 diastolic dysfunction on echo 05/2019 no CHF on exam  GI bleed felt secondary to hemorrhoids 12/2018 continues on Coumadin  Medication Adjustments/Labs and Tests Ordered: Current medicines are reviewed at length with the patient today.  Concerns regarding medicines are outlined above.  Medication changes, Labs and Tests ordered today are listed in the Patient Instructions below. There are no Patient Instructions on file for this visit.   Signed, Jacolyn Reedy, PA-C  05/21/2019 1:33 PM    [facility] [address] [place], [place], [place]  [address] Phone: [phone]; Fax: [phone]

## 2019-05-20 NOTE — Telephone Encounter (Signed)
Transition Care Management Follow-up Telephone Call  Date of discharge and from where: 05/19/19 [facility]  How have you been since you were released from the hospital? Better  Any questions or concerns? Yes  Medication concern. Appt scheduled for today with Dinah  Items Reviewed:  Did the pt receive and understand the discharge instructions provided? Yes   Medications obtained and verified? Yes   Any new allergies since your discharge? No   Dietary orders reviewed? Yes  Do you have support at home? Yes   Other (ie: DME, Home Health, etc) Home Health  Functional Questionnaire: (I = Independent and D = Dependent) ADL's: I with assistance  Bathing/Dressing- D   Meal Prep- D  Eating- I  Maintaining continence- I  Transferring/Ambulation- I with assistance   Managing Meds- D   Follow up appointments reviewed:    PCP Hospital f/u appt confirmed? Yes  Scheduled to see Dinah on 2/16 .  [facility] f/u appt confirmed? No    Are transportation arrangements needed? No   If their condition worsens, is the pt aware to call  their PCP or go to the ED? Yes  Was the patient provided with contact information for the PCP's office or ED? Yes  Was the pt encouraged to call back with questions or concerns? Yes

## 2019-05-20 NOTE — Progress Notes (Signed)
Patient ID: Shannon [name], female   DOB: 19-Aug-1923, 86 y.o.   MRN: [id number] This service is provided via telemedicine  No vital signs collected/recorded due to the encounter was a telemedicine visit.   Location of patient (ex: home, work):  HOME  Patient consents to a telephone visit:  YES  Location of the provider (ex: office, home):  OFFICE  Name of any referring provider:  Abbey Chatters, NP  Names of all persons participating in the telemedicine service and their role in the encounter:  DAUGHTER, PATIENT, Mervin Hack, CMA, [facility] Iker Nuttall, NP  Time spent on call:  5:55   Provider: Masao Junker FNP-C  Sharon Seller, NP  Patient Care Team: Sharon Seller, NP as PCP - General (Geriatric Medicine) Lars Masson, MD as PCP - Cardiology (Cardiology) Felecia Shelling, DPM as Consulting Physician (Podiatry) Lars Masson, MD as Consulting Physician (Cardiology) Sharon Seller, NP as Nurse Practitioner (Geriatric Medicine) Gelene Mink, OD as Referring Physician (Optometry) Pleasant, Dennard Schaumann, RN as [facility] Management  Extended Emergency Contact Information Primary Emergency Contact: Armstrong,[name] Address: [address]          [place], [place] [address] [place] Home Phone: [phone] Mobile Phone: [phone] Relation: Daughter Secondary Emergency Contact: Vallecillo,John  [place] Home Phone: [phone] Relation: Son  Code Status: Full code  Goals of care: Advanced Directive information Advanced Directives 05/19/2019  Does Patient Have a Medical Advance Directive? Yes  Type of Advance Directive Healthcare Power of Attorney  Does patient want to make changes to medical advance directive? No - Patient declined  Copy of Healthcare Power of Attorney in Chart? No - copy requested  Would patient like information on creating a medical advance directive? -     Chief Complaint  Patient  presents with  . Transitions Of Care    Discharged 05/19/2019    HPI:  Pt is a 86 y.o. female seen today for Transition of care post Hospital admission 05/18/2019-05/19/2019 for near syncope.she has a medical history of Hypertension,Afib,CAD, hx CVA,chronic diastolic CHF,COPD,Asthma,Hyerlipidemia,Hypothyroidism,CKD stage 3,GERD,PAD,SIADH,SBO,Pyloric stenosis s/p J-tube,perforated gastric ulcer among other conditions. she presented to the ED after an episode of slurred speech followed by syncope.Family reported oxygen dropped to 76% and was placed on oxygen 2 liters via  [place] which brought up her oxygen to 96%.she wears oxygen at night.family also reported patient had chest pain earlier prior to episode though resolved after she vomited thought due to gas.  Labs done in ED BUN 60,CR 1.7 above her baseline,lipase,liver enzymes and Magnesium were normal.High-sensitivity troponin was 18.Troponin rechecked was normal. AKI on CKD stage 3 was thought due to dehydration and home use diuretic use.  EKG showed sinus rhythm with left artrial enlargement no significant changes from previous EKG.she had 2D ECHO which showed preserved LV function.EEG was normal.Carotid duplex U/S was negative for hemodynamically significant stenosis.Her chest pain was thought possible due to her chronic difficulty swallowing given no tachycardia and on coumadin no suspicion for PE.  States medication were not given at the hospital unclear.Her U/a was negative for UTI.COVID-19 and Influenza were negative.Chest X-ray showed mild cardiomegaly and no acute disease.Her Head CT scan was negative for acute Intercranial abnormalities.Her INR was 1.7 though daughter states patient missed her coumadin 7.5 mg tablet dose on Sunday. she was observed overnight no arrhythmias.She was evaluated by PT recommended [facility].Patient states doing better doesn't need physical therapy. She was  advised to  take all medication through her PEG- tube  due to trouble with swallowing.Daughter states just on liquid and jello.On tube feeding Osmolite 711 cc from 9 pm -9 Am with 180 cc water flushes.   COPD/Asthma - her home inhlaer was continued.she reports shortness of breath with exertion.suspect due to her CHF  Type 2 DM - CBG under controlled.latest Hgb A1C 6.2 (03/02/2019)home regimen continued.  Hypertension - per daughter patient's medication was not given at the hospital.Unclear why though states has already discussed with in charge person at the hospital .  CBC/diff and BMP recommended to be draw by PCP but daughter states patient has upcoming appointment with Cardiologist 05/21/2019 would like lab worker to be drawn during this visit if possible.she will notify PCP if labs not drawn.   Past Medical History:  Diagnosis Date  . Acute respiratory failure (HCC)   . Anemia    previous blood transfusions  . Arthritis    "all over"  . Asthma   . Atrial flutter (HCC)   . Bradycardia    requiring previous d/c of BB and reduction of amiodarone  . CAD (coronary artery disease)    nonobstructive per notes  . Chronic diastolic CHF (congestive heart failure) (HCC)   . CKD (chronic kidney disease), stage III   . Complication of blood transfusion    "got the wrong blood type at [facility] in ~ 2015; no adverse reaction that we are aware of"/daughter, Shannon [name] (01/27/2016)  . COPD (chronic obstructive pulmonary disease) (HCC)   . Depression    "light case"  . DVT (deep venous thrombosis) (HCC) 01/2016   a. LLE DVT 01/2016 - switched from Eliquis to Coumadin.  [name] Dyspnea    with some activity  . Gastric stenosis    a. s/p stomach tube  . GERD (gastroesophageal reflux disease)   . History of blood transfusion    "several" (01/27/2016)  . History of stomach ulcers   . Hyperlipidemia   . Hypertension   . Hypothyroidism   . On home oxygen therapy    "2L; 9PM til 9AM" (06/27/2017)  . PAD (peripheral artery disease) (HCC)    a. prior  gangrene, toe amputations, intervention.  [name] PAF (paroxysmal atrial fibrillation) (HCC)   . Paraesophageal hernia   . Perforated gastric ulcer (HCC)   . Pneumonia    "a few times" (06/27/2017)  . Seasonal allergies   . SIADH (syndrome of inappropriate ADH production) (HCC)    [name] 01/10/2015  . Small bowel obstruction (HCC)    "I don't know how many" (01/11/2015)  . Stroke [facility])    several- no residual  . Type II diabetes mellitus (HCC)    "related to prednisone use  for > 20 yr; once predinose stopped; no more DM RX" (01/27/2016)  . UTI (urinary tract infection) 02/08/2016  . Ventral hernia with bowel obstruction    Past Surgical History:  Procedure Laterality Date  . ABDOMINAL AORTOGRAM N/A 09/21/2016   Procedure: Abdominal Aortogram;  Surgeon: Maeola Harman, MD;  Location: [facility] INVASIVE CV LAB;  Service: Cardiovascular;  Laterality: N/A;  . AMPUTATION Left 09/25/2016   Procedure: AMPUTATION DIGIT- LEFT 2ND AND 3RD TOES;  Surgeon: Larina Earthly, MD;  Location: [facility] OR;  Service: Vascular;  Laterality: Left;  . AMPUTATION Right 05/03/2018   Procedure: AMPUTATION RIGHT GREAT TOE;  Surgeon: Larina Earthly, MD;  Location: [facility] OR;  Service: Vascular;  Laterality: Right;  . CATARACT EXTRACTION W/ INTRAOCULAR LENS  IMPLANT, BILATERAL    .  CHOLECYSTECTOMY OPEN    . COLECTOMY    . ESOPHAGOGASTRODUODENOSCOPY N/A 01/19/2014   Procedure: ESOPHAGOGASTRODUODENOSCOPY (EGD);  Surgeon: Hilarie Fredrickson, MD;  Location: [facility] ENDOSCOPY;  Service: Endoscopy;  Laterality: N/A;  . ESOPHAGOGASTRODUODENOSCOPY N/A 01/20/2014   Procedure: ESOPHAGOGASTRODUODENOSCOPY (EGD);  Surgeon: Hilarie Fredrickson, MD;  Location: [facility] ENDOSCOPY;  Service: Endoscopy;  Laterality: N/A;  . ESOPHAGOGASTRODUODENOSCOPY N/A 03/19/2014   Procedure: ESOPHAGOGASTRODUODENOSCOPY (EGD);  Surgeon: Rachael Fee, MD;  Location: [facility] ENDOSCOPY;  Service: Endoscopy;  Laterality: N/A;  . ESOPHAGOGASTRODUODENOSCOPY N/A 07/08/2015   Procedure:  ESOPHAGOGASTRODUODENOSCOPY (EGD);  Surgeon: Sherrilyn Rist, MD;  Location: [facility] ENDOSCOPY;  Service: Endoscopy;  Laterality: N/A;  . ESOPHAGOGASTRODUODENOSCOPY (EGD) WITH PROPOFOL N/A 09/15/2015   Procedure: ESOPHAGOGASTRODUODENOSCOPY (EGD) WITH PROPOFOL;  Surgeon: Ruffin Frederick, MD;  Location: [facility] ENDOSCOPY;  Service: Gastroenterology;  Laterality: N/A;  . GASTROJEJUNOSTOMY     hx/notes 01/10/2015  . GASTROJEJUNOSTOMY N/A 09/23/2015   Procedure: OPEN GASTROJEJUNOSTOMY TUBE PLACEMENT;  Surgeon: De Blanch Kinsinger, MD;  Location: [facility] ORS;  Service: General;  Laterality: N/A;  . GLAUCOMA SURGERY Bilateral   . HERNIA REPAIR  2015  . IR CM INJ ANY COLONIC TUBE W/FLUORO  01/05/2017  . IR CM INJ ANY COLONIC TUBE W/FLUORO  06/07/2017  . IR CM INJ ANY COLONIC TUBE W/FLUORO  11/05/2017  . IR CM INJ ANY COLONIC TUBE W/FLUORO  09/18/2018  . IR CM INJ ANY COLONIC TUBE W/FLUORO  03/06/2019  . IR CM INJ ANY COLONIC TUBE W/FLUORO  03/11/2019  . IR GENERIC HISTORICAL  01/07/2016   IR GJ TUBE CHANGE 01/07/2016 Malachy Moan, MD [facility]  . IR GENERIC HISTORICAL  01/27/2016   IR MECH REMOV OBSTRUC MAT ANY COLON TUBE W/FLUORO 01/27/2016 Richarda Overlie, MD [facility]  . IR GENERIC HISTORICAL  02/07/2016   IR PATIENT EVAL TECH 0-60 MINS Darrell K Allred, PA-C [facility]  . IR GENERIC HISTORICAL  02/08/2016   IR GJ TUBE CHANGE 02/08/2016 Berdine Dance, MD [facility]  . IR GENERIC HISTORICAL  01/06/2016   IR GJ TUBE CHANGE 01/06/2016 [facility] OUT REF  . IR GENERIC HISTORICAL  05/02/2016   IR CM INJ ANY COLONIC TUBE W/FLUORO 05/02/2016 Oley Balm, MD [facility]  . IR GENERIC HISTORICAL  05/15/2016   IR GJ TUBE CHANGE 05/15/2016 Simonne Come, MD [facility]  . IR GENERIC HISTORICAL  06/28/2016   IR GJ TUBE CHANGE 06/28/2016 [facility]  . IR GJ TUBE CHANGE  02/20/2017  . IR GJ TUBE CHANGE  05/10/2017  . IR GJ TUBE CHANGE  07/06/2017  . IR GJ TUBE CHANGE  08/02/2017  . IR GJ TUBE CHANGE  10/15/2017  . IR GJ  TUBE CHANGE  12/26/2017  . IR GJ TUBE CHANGE  04/08/2018  . IR GJ TUBE CHANGE  06/19/2018  . IR GJ TUBE CHANGE  03/03/2019  . IR PATIENT EVAL TECH 0-60 MINS  10/19/2016  . IR PATIENT EVAL TECH 0-60 MINS  12/25/2016  . IR PATIENT EVAL TECH 0-60 MINS  01/29/2017  . IR PATIENT EVAL TECH 0-60 MINS  04/04/2017  . IR PATIENT EVAL TECH 0-60 MINS  04/30/2017  . IR PATIENT EVAL TECH 0-60 MINS  07/31/2017  . IR REPLC DUODEN/JEJUNO TUBE PERCUT W/FLUORO  11/14/2016  . LAPAROTOMY N/A 01/20/2015   Procedure: EXPLORATORY LAPAROTOMY;  Surgeon: Abigail Miyamoto, MD;  Location: [facility] OR;  Service: General;  Laterality: N/A;  . LOWER EXTREMITY ANGIOGRAPHY Left 09/21/2016   Procedure: Lower Extremity Angiography;  Surgeon: Maeola Harman, MD;  Location: [facility] INVASIVE CV LAB;  Service: Cardiovascular;  Laterality: Left;  . LOWER EXTREMITY ANGIOGRAPHY Right 06/27/2017   Procedure: Lower Extremity Angiography;  Surgeon: Maeola Harman, MD;  Location: [facility] INVASIVE CV LAB;  Service: Cardiovascular;  Laterality: Right;  . LYSIS OF ADHESION N/A 01/20/2015   Procedure: LYSIS OF ADHESIONS < 1 HOUR;  Surgeon: Abigail Miyamoto, MD;  Location: [facility] OR;  Service: General;  Laterality: N/A;  . PERIPHERAL VASCULAR BALLOON ANGIOPLASTY Left 09/21/2016   Procedure: Peripheral Vascular Balloon Angioplasty;  Surgeon: Maeola Harman, MD;  Location: [facility] INVASIVE CV LAB;  Service: Cardiovascular;  Laterality: Left;  drug coated balloon  . PERIPHERAL VASCULAR BALLOON ANGIOPLASTY Right 06/27/2017   Procedure: PERIPHERAL VASCULAR BALLOON ANGIOPLASTY;  Surgeon: Maeola Harman, MD;  Location: [facility] INVASIVE CV LAB;  Service: Cardiovascular;  Laterality: Right;  SFA/TPTRUNK  . TONSILLECTOMY    . TUBAL LIGATION    . VENTRAL HERNIA REPAIR  2015   incarcerated ventral hernia ([facility] 09/2013)/notes 01/10/2015    Allergies  Allergen Reactions  . Penicillins Itching, Rash and Other (See Comments)    Tolerated amoxicillin,  unasyn, zosyn & cephalosporins in the past. Did it involve swelling of the face/tongue/throat, SOB, or low BP? No Did it involve sudden or severe rash/hives, skin peeling, or any reaction on the inside of your mouth or nose? No Did you need to seek medical attention at a hospital or doctor's office? Unknown When did it last happen?5+ years If all above answers are "NO", may proceed with cephalosporin use.     Outpatient Encounter Medications as of 05/20/2019  Medication Sig  . acetaminophen (TYLENOL) 500 MG tablet Take 500 mg by mouth every 6 (six) hours as needed (pain).   . Amino Acids-Protein Hydrolys (FEEDING SUPPLEMENT, PRO-STAT SUGAR FREE 64,) LIQD Take 30 mLs by mouth 2 (two) times daily.  [name] amLODipine (NORVASC) 5 MG tablet Take 1 tablet (5 mg total) by mouth daily.  [name] atorvastatin (LIPITOR) 10 MG tablet Take 1 tablet (10 mg total) by mouth daily.  [name] b complex vitamins tablet Take 1 tablet by mouth daily.   . calcium-vitamin D (OSCAL WITH D) 500-200 MG-UNIT per tablet Take 1 tablet by mouth daily with breakfast.   . Carboxymethylcellulose Sodium (ARTIFICIAL TEARS OP) Place 1 drop into both eyes 4 (four) times daily.  . cycloSPORINE (RESTASIS) 0.05 % ophthalmic emulsion USE 1 DROP INTO BOTH EYES TWICE DAILY  . DULERA 200-5 MCG/ACT AERO TAKE TWO PUFFS BY MOUTH TWICE DAILY  . feeding supplement (OSMOLITE 1 CAL) LIQD Take 711 mLs by mouth See admin instructions. For 12 hours 2100 - 0900  . ferrous sulfate 325 (65 FE) MG tablet TAKE 1 TABLET BY MOUTH EVERY DAY  . fluticasone (FLONASE) 50 MCG/ACT nasal spray SPRAY 2 SPRAYS INTO EACH NOSTRIL EVERY DAY  . furosemide (LASIX) 20 MG tablet Take 20-40 mg by mouth See admin instructions. Daily unless swelling occurs take 40 mg tablet and then switch back to 20 mg daily   . gabapentin (NEURONTIN) 300 MG capsule TAKE 1 CAPSULE BY MOUTH TWICE A DAY  . gentamicin cream (GARAMYCIN) 0.1 % Apply 1 application topically 3 (three) times daily as  needed (dry skin).  . hydrALAZINE (APRESOLINE) 50 MG tablet TAKE 1 TABLET BY MOUTH THREE TIMES A DAY  . insulin aspart (NOVOLOG) 100 UNIT/ML FlexPen Inject 0-6 Units into the skin 3 (three) times daily with meals. New sliding Scale If BS is 151 -  251 = 2 units,  If BS is 251 -  350 = 4 units,  If BS is 351 - 450 = 6 units Call provider if BS runs more than 400  . Insulin Glargine (LANTUS SOLOSTAR) 100 UNIT/ML Solostar Pen Inject 8 Units into the skin at bedtime.  . Insulin Pen Needle (B-D UF III MINI PEN NEEDLES) 31G X 5 MM MISC Use twice daily with giving insulin injections. Dx E11.9  . ipratropium-albuterol (DUONEB) 0.5-2.5 (3) MG/3ML SOLN Take 3 mLs by nebulization every 6 (six) hours as needed (sob). Use 3 times daily x 4 days then every 6 hours as needed.  . isosorbide mononitrate (IMDUR) 30 MG 24 hr tablet TAKE 2 TABLETS BY MOUTH DAILY. PLEASE MAKE ANNUAL APPT WITH DR. Delton See FOR FUTURE REFILLS. THANK YOU. 1ST ATTEMPT.  . latanoprost (XALATAN) 0.005 % ophthalmic solution Place 1 drop into both eyes at bedtime.  . levalbuterol (XOPENEX HFA) 45 MCG/ACT inhaler Inhale 2 puffs into the lungs every 6 (six) hours as needed for wheezing or shortness of breath.  . levothyroxine (SYNTHROID) 125 MCG tablet TAKE 1 TABLET BY MOUTH EVERY DAY  . loratadine (CLARITIN) 10 MG tablet Take 10 mg by mouth daily as needed for allergies.  . Multiple Vitamin (MULTIVITAMIN WITH MINERALS) TABS tablet Take 1 tablet by mouth daily.  [name] nystatin (MYCOSTATIN/NYSTOP) powder Apply 1 g topically as needed (skin irritation).   [name] oxyCODONE (ROXICODONE) 5 MG immediate release tablet Take 1 tablet (5 mg total) by mouth every 4 (four) hours as needed for severe pain. May take 1/2 tab q4hrs as needed for mild pain.  . OXYGEN Inhale 2 L into the lungs at bedtime.  . pantoprazole (PROTONIX) 40 MG tablet TAKE 1 TABLET BY MOUTH TWICE A DAY  . Probiotic Product (EQL PROBIOTIC COLON SUPPORT PO) Take 1 capsule by mouth daily.  .  sertraline (ZOLOFT) 50 MG tablet TAKE 1 TABLET BY MOUTH EVERY DAY  . warfarin (COUMADIN) 2.5 MG tablet Take 2-3 tablets by mouth daily as directed by Coumadin clinic.  [name] Water For Irrigation, Sterile (FREE WATER) SOLN Place 180 mLs into feeding tube 3 (three) times daily.   Cliffton Asters Petrolatum-Mineral Oil (SYSTANE NIGHTTIME) OINT Place 1 application into both eyes at bedtime.    No facility-administered encounter medications on file as of 05/20/2019.    Review of Systems  Constitutional: Negative for appetite change, chills, fatigue and fever.  HENT: Positive for hearing loss. Negative for congestion, rhinorrhea, sinus pressure, sinus pain, sneezing, sore throat and trouble swallowing.   Eyes: Negative for pain, discharge, redness and itching.  Respiratory: Negative for cough, chest tightness and wheezing.        Feels windy with exertion   Cardiovascular: Positive for leg swelling. Negative for chest pain and palpitations.  Gastrointestinal: Negative for abdominal distention, abdominal pain, constipation, diarrhea, nausea and vomiting.  Endocrine: Negative for cold intolerance, heat intolerance, polydipsia, polyphagia and polyuria.  Genitourinary: Negative for decreased urine volume, difficulty urinating, dysuria, flank pain, frequency, hematuria and urgency.  Musculoskeletal: Positive for arthralgias and gait problem. Negative for myalgias.  Skin: Negative for color change, pallor and rash.  Neurological: Negative for dizziness, syncope, speech difficulty, light-headedness and headaches.  Hematological: Does not bruise/bleed easily.  Psychiatric/Behavioral: Negative for agitation and sleep disturbance. The patient is not nervous/anxious.     Immunization History  Administered Date(s) Administered  . Fluad Quad(high Dose 65+) 12/02/2018  . Influenza, High Dose Seasonal PF 01/19/2017  . Influenza,inj,Quad PF,6+ Mos 12/24/2013,  01/27/2015, 12/29/2015, 12/04/2017  . Influenza-Unspecified  12/02/2012  . Pneumococcal Conjugate-13 02/03/2014  . Pneumococcal Polysaccharide-23 08/31/2016  . Tdap 07/18/2016  . Zoster 04/12/2011   Pertinent  Health Maintenance Due  Topic Date Due  . FOOT EXAM  08/22/2017  . HEMOGLOBIN A1C  08/30/2019  . OPHTHALMOLOGY EXAM  12/16/2019  . INFLUENZA VACCINE  Completed  . DEXA SCAN  Completed  . PNA vac Low Risk Adult  Completed   Fall Risk  05/06/2019 03/17/2019 03/13/2019 02/12/2019 10/10/2018  Falls in the past year? 0 0 0 0 0  Number falls in past yr: 0 0 0 0 0  Injury with Fall? 0 0 0 0 0  Risk for fall due to : - - - - -  Follow up - Falls evaluation completed - - -   Functional Status Survey:    Vitals:   05/20/19 1600  BP: 118/69  Weight: 135 lb (61.2 kg)  Height: 5\' 6"  (1.676 m)   Body mass index is 21.79 kg/m. Physical Exam  Labs reviewed: Recent Labs    03/03/19 0355 05/18/19 1925 05/18/19 2355  NA 139 134* 135  K 4.3 4.3 4.5  CL 108 101 103  CO2 23 24 24   GLUCOSE 95 80 82  BUN 40* 60* 60*  CREATININE 1.23* 1.75* 1.72*  CALCIUM 9.1 9.4 9.6  MG  --  2.2  --    Recent Labs    12/25/18 1518 12/26/18 0533 05/18/19 1925  AST 27 24 28   ALT 20 19 21   ALKPHOS 87 84 89  BILITOT 0.5 0.6 0.7  PROT 7.9 7.4 8.0  ALBUMIN 3.8 3.5 3.9   Recent Labs    11/24/18 2036 11/25/18 0530 12/02/18 1555 12/25/18 1518 03/01/19 2145 03/02/19 0242 03/02/19 0731 03/03/19 0355 05/18/19 1925  WBC 6.5   < > 5.7   < > 6.0   < > 6.3 4.6 7.0  NEUTROABS 4.2  --  3,631  --  3.6  --   --   --   --   HGB 9.6*   < > 9.6*   < > 9.4*   < > 9.8* 9.0* 10.0*  HCT 29.7*   < > 29.2*   < > 29.1*   < > 30.0* 27.5* 29.9*  MCV 97.4   < > 94.8   < > 93.9   < > 94.9 93.5 92.3  PLT 201   < > 225   < > 228   < > 224 217 200   < > = values in this interval not displayed.   Lab Results  Component Value Date   TSH 0.98 10/15/2018   Lab Results  Component Value Date   HGBA1C 6.2 (H) 03/02/2019   Lab Results  Component Value Date   CHOL 131  10/15/2018   HDL 47 (L) 10/15/2018   LDLCALC 58 10/15/2018   TRIG 188 (H) 10/15/2018   CHOLHDL 2.8 10/15/2018    Significant Diagnostic Results in last 30 days:  DG Abd 1 View  Result Date: 05/18/2019 CLINICAL DATA:  J-tube positioning. EXAM: ABDOMEN - 1 VIEW COMPARISON:  None. FINDINGS: There is a percutaneous gastrojejunostomy tube in place. The positioning of the tube appears grossly unremarkable. The retention bulb is inflated. The configuration of the tube is similar to placement on March 11, 2019. The bowel gas pattern is nonspecific and nonobstructive. IMPRESSION: 1. Unremarkable appearance of the gastrojejunostomy tube 2. Nonobstructive bowel gas pattern. Electronically Signed   By: Cristal Deer  Green M.D.   On: 05/18/2019 23:28   CT Head Wo Contrast  Result Date: 05/18/2019 CLINICAL DATA:  Syncopal episode, weakness EXAM: CT HEAD WITHOUT CONTRAST TECHNIQUE: Contiguous axial images were obtained from the base of the skull through the vertex without intravenous contrast. COMPARISON:  11/24/2018 head CT, 11/02/2016 FINDINGS: Brain: No acute territorial infarction, hemorrhage or intracranial mass. Moderate atrophy. Patchy white matter hypodensity consistent with chronic small vessel ischemic change. Chronic left greater than right cerebellar infarcts. Vascular: No hyperdense vessels. Carotid and vertebral vascular calcification Skull: No depressed skull fracture. Prominent degenerative change in pannus about the C1-C2 articulation, chronic Sinuses/Orbits: No acute finding. Other: None IMPRESSION: 1. No CT evidence for acute intracranial abnormality. 2. Atrophy and chronic small vessel ischemic change of the white matter Electronically Signed   By: Jasmine Pang M.D.   On: 05/18/2019 20:07   DG Chest Portable 1 View  Result Date: 05/18/2019 CLINICAL DATA:  Weakness syncopal episode EXAM: PORTABLE CHEST 1 VIEW COMPARISON:  11/24/2018 FINDINGS: No focal airspace disease or pleural effusion.  Mild cardiomegaly. Atelectasis left base. Aortic atherosclerosis. No pneumothorax. IMPRESSION: No active disease.  Mild cardiomegaly. Electronically Signed   By: Jasmine Pang M.D.   On: 05/18/2019 20:02   EEG adult  Result Date: 05/19/2019 Charlsie Quest, MD     05/19/2019  4:10 PM Patient Name: SADINA HAISTEN MRN: [id number] Epilepsy Attending: Charlsie Quest Referring Physician/Provider: Dr. John Giovanni Date: 05/19/2019 Duration: 25.56 minutes Patient history: 86 year old female who presented with syncope-like episode as well as brief episode of slurred speech.  EEG evaluate for seizures. Level of alertness: Awake AEDs during EEG study: None Technical aspects: This EEG study was done with scalp electrodes positioned according to the 10-20 International system of electrode placement. Electrical activity was acquired at a sampling rate of 500Hz  and reviewed with a high frequency filter of 70Hz  and a low frequency filter of 1Hz . EEG data were recorded continuously and digitally stored. Description: The posterior dominant rhythm consists of 8-9Hz  activity of moderate voltage (25-35 uV) seen predominantly in posterior head regions, symmetric and reactive to eye opening and eye closing.  Physiologic photic driving was not seen during photic stimulation.  Hyperventilation was not performed.  Of note, EEG was technically difficult due to significant electrode and myogenic artifact. IMPRESSION: This technically difficult study is within normal limits. No seizures or epileptiform discharges were seen throughout the recording. Charlsie Quest   ECHOCARDIOGRAM COMPLETE  Result Date: 05/19/2019    ECHOCARDIOGRAM REPORT   Patient Name:   MARYJAYNE BESTON Date of Exam: 05/19/2019 Medical Rec #:  [id number]     Height:       66.0 in Accession #:    [id number]    Weight:       135.1 lb Date of Birth:  [birth year]     BSA:          1.69 m Patient Age:    89 years      BP:           162/61 mmHg Patient Gender: F              HR:           64 bpm. Exam Location:  Inpatient Procedure: 2D Echo, Color Doppler and Cardiac Doppler Indications:    R55 Syncope  History:        Patient has prior history of Echocardiogram examinations, most  recent 11/25/2018. CHF, COPD, Arrythmias:Atrial Fibrillation,                 Signs/Symptoms:Syncope; Risk Factors:Hypertension, Diabetes and                 Dyslipidemia.  Sonographer:    Irving Burton Senior RDCS Referring Phys: [id number] John Giovanni  Sonographer Comments: Technically difficult due to small rib spacing. IMPRESSIONS  1. Left ventricular ejection fraction, by estimation, is 65 to 70%. The left ventricle has normal function. The left ventricle has no regional wall motion abnormalities. There is mild concentric left ventricular hypertrophy. Left ventricular diastolic parameters are consistent with Grade I diastolic dysfunction (impaired relaxation).  2. Right ventricular systolic function is normal. The right ventricular size is normal. There is normal pulmonary artery systolic pressure. The estimated right ventricular systolic pressure is 31.4 mmHg.  3. The mitral valve is degenerative. Trivial mitral valve regurgitation.  4. The aortic valve has an indeterminant number of cusps. Aortic valve regurgitation is trivial. Mild aortic valve sclerosis is present, with no evidence of aortic valve stenosis.  5. The inferior vena cava is normal in size with <50% respiratory variability, suggesting right atrial pressure of 8 mmHg. Comparison(s): A prior study was performed on 11/25/2018. No significant change from prior study. FINDINGS  Left Ventricle: Left ventricular ejection fraction, by estimation, is 65 to 70%. The left ventricle has normal function. The left ventricle has no regional wall motion abnormalities. The left ventricular internal cavity size was small. There is mild concentric left ventricular hypertrophy. Left ventricular diastolic parameters are consistent with Grade I  diastolic dysfunction (impaired relaxation). Right Ventricle: The right ventricular size is normal. No increase in right ventricular wall thickness. Right ventricular systolic function is normal. There is normal pulmonary artery systolic pressure. The tricuspid regurgitant velocity is 2.42 m/s, and  with an assumed right atrial pressure of 8 mmHg, the estimated right ventricular systolic pressure is 31.4 mmHg. Left Atrium: Left atrial size was normal in size. Right Atrium: Right atrial size was normal in size. Pericardium: Trivial pericardial effusion is present. Presence of pericardial fat pad. Mitral Valve: The mitral valve is degenerative in appearance. Mild mitral annular calcification. Trivial mitral valve regurgitation. Tricuspid Valve: The tricuspid valve is grossly normal. Tricuspid valve regurgitation is not demonstrated. Aortic Valve: The aortic valve has an indeterminant number of cusps. Aortic valve regurgitation is trivial. Mild aortic valve sclerosis is present, with no evidence of aortic valve stenosis. Pulmonic Valve: The pulmonic valve was grossly normal. Pulmonic valve regurgitation is not visualized. Aorta: The aortic root and ascending aorta are structurally normal, with no evidence of dilitation. Venous: The inferior vena cava is normal in size with less than 50% respiratory variability, suggesting right atrial pressure of 8 mmHg. IAS/Shunts: No atrial level shunt detected by color flow Doppler.  LEFT VENTRICLE PLAX 2D LVIDd:         3.00 cm  Diastology LVIDs:         2.10 cm  LV e' lateral:   4.79 cm/s LV PW:         1.20 cm  LV E/e' lateral: 11.6 LV IVS:        1.20 cm  LV e' medial:    6.31 cm/s LVOT diam:     1.70 cm  LV E/e' medial:  8.8 LV SV:         39.72 ml LV SV Index:   12.18 LVOT Area:     2.27 cm  RIGHT VENTRICLE  RV S prime:     13.10 cm/s TAPSE (M-mode): 1.9 cm LEFT ATRIUM             Index       RIGHT ATRIUM           Index LA diam:        3.50 cm 2.07 cm/m  RA Area:     14.30  cm LA Vol (A2C):   46.7 ml 27.59 ml/m RA Volume:   31.00 ml  18.31 ml/m LA Vol (A4C):   44.2 ml 26.11 ml/m LA Biplane Vol: 45.6 ml 26.94 ml/m  AORTIC VALVE LVOT Vmax:   71.10 cm/s LVOT Vmean:  50.700 cm/s LVOT VTI:    0.175 m  AORTA Ao Root diam: 2.90 cm Ao Asc diam:  2.70 cm MITRAL VALVE               TRICUSPID VALVE MV Area (PHT): 2.54 cm    TR Peak grad:   23.4 mmHg MV Decel Time: 299 msec    TR Vmax:        242.00 cm/s MV E velocity: 55.70 cm/s MV A velocity: 80.60 cm/s  SHUNTS MV E/A ratio:  0.69        Systemic VTI:  0.18 m                            Systemic Diam: 1.70 cm Lennie Odor MD Electronically signed by Lennie Odor MD Signature Date/Time: 05/19/2019/3:14:11 PM    Final    VAS US CAROTID  Result Date: 05/19/2019 Carotid Arterial Duplex Study Indications:       Syncope. Risk Factors:      Hypertension, Diabetes. Limitations        Today's exam was limited due to the high bifurcation of the                    carotid, the body habitus of the patient, the patient's                    respiratory variation and patient anatomy. Comparison Study:  01/08/2019 - 1-39% ICA stenosis bilaterally. Performing Technologist: Chanda Busing RVT  Examination Guidelines: A complete evaluation includes B-mode imaging, spectral Doppler, color Doppler, and power Doppler as needed of all accessible portions of each vessel. Bilateral testing is considered an integral part of a complete examination. Limited examinations for reoccurring indications may be performed as noted.  Right Carotid Findings: +----------+--------+--------+--------+-----------------------+--------+           PSV cm/sEDV cm/sStenosisPlaque Description     Comments +----------+--------+--------+--------+-----------------------+--------+ CCA Prox  94      13              smooth and heterogenoustortuous +----------+--------+--------+--------+-----------------------+--------+ CCA Distal77      9               smooth and  heterogenous         +----------+--------+--------+--------+-----------------------+--------+ ICA Prox  75      12              smooth and heterogenous         +----------+--------+--------+--------+-----------------------+--------+ ICA Distal85      19                                     tortuous +----------+--------+--------+--------+-----------------------+--------+ ECA  84      0                                               +----------+--------+--------+--------+-----------------------+--------+ +----------+--------+-------+--------+-------------------+           PSV cm/sEDV cmsDescribeArm Pressure (mmHG) +----------+--------+-------+--------+-------------------+ [id number]                                         +----------+--------+-------+--------+-------------------+ +---------+--------+--+--------+--+---------+ VertebralPSV cm/s50EDV cm/s10Antegrade +---------+--------+--+--------+--+---------+  Left Carotid Findings: +----------+--------+--------+--------+-----------------------+--------+           PSV cm/sEDV cm/sStenosisPlaque Description     Comments +----------+--------+--------+--------+-----------------------+--------+ CCA Prox  104     14              smooth and heterogenous         +----------+--------+--------+--------+-----------------------+--------+ CCA Distal82      11              smooth and heterogenous         +----------+--------+--------+--------+-----------------------+--------+ ICA Prox  58      14              smooth and heterogenoustortuous +----------+--------+--------+--------+-----------------------+--------+ ICA Distal91      15                                     tortuous +----------+--------+--------+--------+-----------------------+--------+ ECA       57      8                                               +----------+--------+--------+--------+-----------------------+--------+  +----------+--------+--------+--------+-------------------+           PSV cm/sEDV cm/sDescribeArm Pressure (mmHG) +----------+--------+--------+--------+-------------------+ [id number]                                          +----------+--------+--------+--------+-------------------+ +---------+--------+--+--------+--+---------+ VertebralPSV cm/s42EDV cm/s10Antegrade +---------+--------+--+--------+--+---------+   Summary: Right Carotid: Velocities in the right ICA are consistent with a 1-39% stenosis. Left Carotid: Velocities in the left ICA are consistent with a 1-39% stenosis. Vertebrals: Bilateral vertebral arteries demonstrate antegrade flow. *See table(s) above for measurements and observations.  Electronically signed by Fabienne Bruns MD on 05/19/2019 at 5:19:26 PM.    Final     Assessment/Plan 1. Hypertensive heart disease with chronic diastolic congestive heart failure (HCC) B/p at goal.continue on Hydralazine 50 mg tablet three times daily, Amlodipine 5 mg tablet daily and Imdur 30 mg 24 Hr tablet.  - CBC/diff and BMP to be drawn by Cardiologist per daughter's request.   2. Persistent atrial fibrillation Latest INR 1.5 during hospital admission.Per daughter patient missed coumadin 7.5 mg tablet on Sunday.Previous INR 2.1 ( 05/16/2019) goal 2.0 -3.0  Continue on Coumadin 7.5 mg tablet every Sunday and Thursday and 5 mg tablet all other days per coumadin clinic. - follow up with Cardiologist as directed 05/21/2019 for coumadin management.   3. Type 2 diabetes mellitus with stage 3b chronic kidney disease, with long-term current use of insulin (  HCC) Lab Results  Component Value Date   HGBA1C 6.2 (H) 03/02/2019  Continue on lantus 8 units daily at bedtime.on Statin for cardiovascular event prevention.on Coumadin.Up to date with annual eye exam.due for annual foot exam will need referral on next routine visit.  4. Chronic diastolic CHF (congestive heart failure)  (HCC) Reports shortness of breath with Exertion,No abrupt weight gain or edema. - continue on Furosemide 20 mg tablet daily and 20 mg tablet as needed for worsening shortness of breath or edema. - continue to check weight daily and notify provider for abrupt weight gain or edema. - Avoid extra salt intake in diet - Keep legs elevated whenever seated.   5. COPD with asthma (HCC) Breathing stable. - continue on DuoNeb,Xopenex and Dulera. Continue to follow up with Pulmonologist.   6. Hypothyroidism, unspecified type Lab Results  Component Value Date   TSH 0.98 10/15/2018  Continue on levothyroxine 125 mcg tablet daily   7. Chronic kidney insufficiency, stage 3 (moderate) AKI during hospitalization. - BMP follow recommended but would like labs draw during visit with cardiologist 05/21/2019.   8. Jejunostomy tube present (HCC) Evaluated by speech Therapy for dysphagia.Continue Osmolite tube feeding 711 cc from 9 pm -9 am with water flushes 180 cc.Crush medication if appropriate with pharmacy and administer via J-tube.    9. Neuropathic pain of both feet Continue current pain regimen.  10. Syncope, unspecified syncope type Status post hospital admission for near syncope suspected vasovagal episode.EKG,ECHO,EEG unremarkable.CT scan negative.Carotid duplex U/s negative. - follow up with cardiologist as directed. Declined Home health Physical Therapy.   Family/ staff Communication: Reviewed plan of care with patient and daughter verbalized understanding.   Labs/tests ordered: None.Daughter request CBC/diff and BMP to be drawn during visit with Cardiologist 05/21/2019.will notify provider if labs not done.    Next Appointment: Has upcoming appointment with 06/11/2019   Spent 40 minutes of face to face on video with patient    Caesar Bookman, NP

## 2019-05-21 ENCOUNTER — Encounter: Payer: Self-pay | Admitting: Physician Assistant

## 2019-05-21 ENCOUNTER — Ambulatory Visit (INDEPENDENT_AMBULATORY_CARE_PROVIDER_SITE_OTHER): Payer: Medicare Other | Admitting: Physician Assistant

## 2019-05-21 ENCOUNTER — Other Ambulatory Visit: Payer: Self-pay

## 2019-05-21 VITALS — BP 134/54 | HR 79 | Ht 66.0 in | Wt 140.8 lb

## 2019-05-21 DIAGNOSIS — I10 Essential (primary) hypertension: Secondary | ICD-10-CM | POA: Diagnosis not present

## 2019-05-21 DIAGNOSIS — I5032 Chronic diastolic (congestive) heart failure: Secondary | ICD-10-CM

## 2019-05-21 DIAGNOSIS — Z8719 Personal history of other diseases of the digestive system: Secondary | ICD-10-CM | POA: Diagnosis not present

## 2019-05-21 DIAGNOSIS — I4819 Other persistent atrial fibrillation: Secondary | ICD-10-CM

## 2019-05-21 DIAGNOSIS — R55 Syncope and collapse: Secondary | ICD-10-CM | POA: Diagnosis not present

## 2019-05-21 NOTE — Telephone Encounter (Signed)
Called spoke with Ron - he is aware that we are waiting on Dr Vaughan Browner to return to the office  Ron [phone]

## 2019-05-21 NOTE — Patient Instructions (Signed)
Medication Instructions:  Your physician recommends that you continue on your current medications as directed. Please refer to the Current Medication list given to you today.  If you need a refill on your cardiac medications before your next appointment, please call your pharmacy.   Lab work: None Ordered  If you have labs (blood work) drawn today and your tests are completely normal, you will receive your results only by: [name] MyChart Message (if you have MyChart) OR . A paper copy in the mail If you have any lab test that is abnormal or we need to change your treatment, we will call you to review the results.  Testing/Procedures: None ordered  Follow-Up: . Follow up with Dr. Meda Coffee on 09/10/19 at 11:00 AM  Any Other Special Instructions Will Be Listed Below (If Applicable).

## 2019-05-26 ENCOUNTER — Ambulatory Visit: Payer: Medicare Other | Admitting: Nurse Practitioner

## 2019-05-27 ENCOUNTER — Other Ambulatory Visit: Payer: Self-pay

## 2019-05-27 ENCOUNTER — Other Ambulatory Visit: Payer: Medicare Other

## 2019-05-27 ENCOUNTER — Other Ambulatory Visit: Payer: Self-pay | Admitting: Family

## 2019-05-27 DIAGNOSIS — I110 Hypertensive heart disease with heart failure: Secondary | ICD-10-CM

## 2019-05-27 DIAGNOSIS — I4819 Other persistent atrial fibrillation: Secondary | ICD-10-CM | POA: Diagnosis not present

## 2019-05-27 DIAGNOSIS — N183 Chronic kidney disease, stage 3 unspecified: Secondary | ICD-10-CM | POA: Diagnosis not present

## 2019-05-27 DIAGNOSIS — I5032 Chronic diastolic (congestive) heart failure: Secondary | ICD-10-CM

## 2019-05-27 LAB — CBC WITH DIFFERENTIAL/PLATELET
Absolute Monocytes: 621 cells/uL (ref 200–950)
Basophils Absolute: 59 cells/uL (ref 0–200)
Basophils Relative: 1.3 %
Eosinophils Absolute: 329 cells/uL (ref 15–500)
Eosinophils Relative: 7.3 %
HCT: 30.6 % — ABNORMAL LOW (ref 35.0–45.0)
Hemoglobin: 9.9 g/dL — ABNORMAL LOW (ref 11.7–15.5)
Lymphs Abs: 842 cells/uL — ABNORMAL LOW (ref 850–3900)
MCH: 30.3 pg (ref 27.0–33.0)
MCHC: 32.4 g/dL (ref 32.0–36.0)
MCV: 93.6 fL (ref 80.0–100.0)
MPV: 12.1 fL (ref 7.5–12.5)
Monocytes Relative: 13.8 %
Neutro Abs: 2651 cells/uL (ref 1500–7800)
Neutrophils Relative %: 58.9 %
Platelets: 235 10*3/uL (ref 140–400)
RBC: 3.27 10*6/uL — ABNORMAL LOW (ref 3.80–5.10)
RDW: 15.4 % — ABNORMAL HIGH (ref 11.0–15.0)
Total Lymphocyte: 18.7 %
WBC: 4.5 10*3/uL (ref 3.8–10.8)

## 2019-05-27 LAB — COMPLETE METABOLIC PANEL WITH GFR
AG Ratio: 1.1 (calc) (ref 1.0–2.5)
ALT: 16 U/L (ref 6–29)
AST: 24 U/L (ref 10–35)
Albumin: 3.9 g/dL (ref 3.6–5.1)
Alkaline phosphatase (APISO): 99 U/L (ref 37–153)
BUN/Creatinine Ratio: 39 (calc) — ABNORMAL HIGH (ref 6–22)
BUN: 58 mg/dL — ABNORMAL HIGH (ref 7–25)
CO2: 28 mmol/L (ref 20–32)
Calcium: 9.8 mg/dL (ref 8.6–10.4)
Chloride: 100 mmol/L (ref 98–110)
Creat: 1.49 mg/dL — ABNORMAL HIGH (ref 0.60–0.88)
GFR, Est African American: 35 mL/min/{1.73_m2} — ABNORMAL LOW (ref >=60)
GFR, Est Non African American: 30 mL/min/{1.73_m2} — ABNORMAL LOW (ref >=60)
Globulin: 3.7 g/dL (calc) (ref 1.9–3.7)
Glucose, Bld: 80 mg/dL (ref 65–99)
Potassium: 4.6 mmol/L (ref 3.5–5.3)
Sodium: 135 mmol/L (ref 135–146)
Total Bilirubin: 0.4 mg/dL (ref 0.2–1.2)
Total Protein: 7.6 g/dL (ref 6.1–8.1)

## 2019-05-27 NOTE — Telephone Encounter (Signed)
Dr. Fredirick Lathe has received the results and I have advised him that patient is calling in about results. I will call the patient as soon as he gives me results.

## 2019-05-27 NOTE — Telephone Encounter (Signed)
Shannon [name], please advise if Dr. Vaughan Browner reviewed this. Thanks.

## 2019-05-28 ENCOUNTER — Encounter: Payer: Self-pay | Admitting: *Deleted

## 2019-05-29 NOTE — Telephone Encounter (Signed)
I called and spoke with the patient's daughter and made her aware that Mrs. Haisley no longer qualifies for oxygen at night while she's sleeping and that her ONO test results shows that her oxygen sats stay up while sleeping. She verbalized understanding. I called and spoke with Keenan Bachelor with [employer] as Ron was not available and made him aware that we will be sending over an order to D/C her nocturnal oxygen.

## 2019-05-30 ENCOUNTER — Ambulatory Visit (INDEPENDENT_AMBULATORY_CARE_PROVIDER_SITE_OTHER): Payer: Medicare Other | Admitting: Pharmacist

## 2019-05-30 ENCOUNTER — Other Ambulatory Visit: Payer: Self-pay

## 2019-05-30 DIAGNOSIS — I4892 Unspecified atrial flutter: Secondary | ICD-10-CM

## 2019-05-30 DIAGNOSIS — Z5181 Encounter for therapeutic drug level monitoring: Secondary | ICD-10-CM

## 2019-05-30 DIAGNOSIS — I4891 Unspecified atrial fibrillation: Secondary | ICD-10-CM | POA: Diagnosis not present

## 2019-05-30 LAB — POCT INR: INR: 1.9 — AB (ref 2.0–3.0)

## 2019-05-30 NOTE — Patient Instructions (Signed)
Today take 3 tablets then continue on same dosage 2 tablets daily except 3 tablets on Sundays and Thursdays. Recheck in 2 weeks. Call Coumadin Clinic with any new medications [phone].

## 2019-06-02 ENCOUNTER — Telehealth: Payer: Self-pay | Admitting: Pharmacist

## 2019-06-02 NOTE — Telephone Encounter (Signed)
Patients daughter dropped anticoag AVS on the way out of the building. It was returned to [place] Friday at the end of the day. I called the daughter to see if she would like [place] to mail to her. She stated that she did not need. Confirmed the instructions of taking 3 tablets that night and then resuming normal dose (also confirmed the normal dose and the date and time of next appointment.) Daughter thanked me for the call and the offer to mail AVS.

## 2019-06-04 ENCOUNTER — Other Ambulatory Visit: Payer: Self-pay

## 2019-06-04 ENCOUNTER — Ambulatory Visit (INDEPENDENT_AMBULATORY_CARE_PROVIDER_SITE_OTHER): Payer: Medicare Other | Admitting: Podiatry

## 2019-06-04 DIAGNOSIS — E0859 Diabetes mellitus due to underlying condition with other circulatory complications: Secondary | ICD-10-CM

## 2019-06-04 DIAGNOSIS — L97512 Non-pressure chronic ulcer of other part of right foot with fat layer exposed: Secondary | ICD-10-CM

## 2019-06-06 ENCOUNTER — Ambulatory Visit (HOSPITAL_COMMUNITY)
Admission: RE | Admit: 2019-06-06 | Discharge: 2019-06-06 | Disposition: A | Discharge status: Home / Self Care | Payer: Medicare Other | Source: Ambulatory Visit | Attending: Radiology | Admitting: Radiology

## 2019-06-06 ENCOUNTER — Telehealth: Payer: Self-pay | Admitting: *Deleted

## 2019-06-06 ENCOUNTER — Other Ambulatory Visit (HOSPITAL_COMMUNITY): Payer: Self-pay | Admitting: Radiology

## 2019-06-06 ENCOUNTER — Other Ambulatory Visit: Payer: Self-pay

## 2019-06-06 DIAGNOSIS — K9413 Enterostomy malfunction: Secondary | ICD-10-CM | POA: Diagnosis not present

## 2019-06-06 DIAGNOSIS — F32A Depression, unspecified: Secondary | ICD-10-CM

## 2019-06-06 DIAGNOSIS — F329 Major depressive disorder, single episode, unspecified: Secondary | ICD-10-CM

## 2019-06-06 DIAGNOSIS — R633 Feeding difficulties, unspecified: Secondary | ICD-10-CM

## 2019-06-06 DIAGNOSIS — K9423 Gastrostomy malfunction: Secondary | ICD-10-CM | POA: Diagnosis not present

## 2019-06-06 DIAGNOSIS — G5793 Unspecified mononeuropathy of bilateral lower limbs: Secondary | ICD-10-CM

## 2019-06-06 DIAGNOSIS — D638 Anemia in other chronic diseases classified elsewhere: Secondary | ICD-10-CM

## 2019-06-06 DIAGNOSIS — I5032 Chronic diastolic (congestive) heart failure: Secondary | ICD-10-CM

## 2019-06-06 HISTORY — PX: IR GJ TUBE CHANGE: IMG1440

## 2019-06-06 MED ORDER — IOHEXOL 300 MG/ML  SOLN
50.0000 mL | Freq: Once | INTRAMUSCULAR | Status: AC | PRN
  Administered 2019-06-06: 10 mL

## 2019-06-06 NOTE — Procedures (Signed)
Interventional Radiology Procedure Note  Procedure: Replacement of a 85F gastro-jenunostomy tube, secondary to fracture on the old tube.   Complications: None  Recommendations:  - Ok to use   - Routine care   Signed,  Dulcy Fanny. Earleen Newport, DO

## 2019-06-06 NOTE — Telephone Encounter (Signed)
Patient daughter, Shannon [name] called and stated that patient has a feeding tube and the Dr. Recommended that she give patient's medications through that tube.  Daughter is wondering if patient's medications could be written in liquid form. Stated that she feels like the medications are damaging the tube. Please Advise.

## 2019-06-07 NOTE — Progress Notes (Signed)
Subjective:  86 y.o. female with PMHx of diabetes mellitus presenting today for follow up evaluation of ulcerations of the right foot. She reports some continued pain but states the wounds have improved. She has been using Gentamicin cream as directed. There are no worsening factors noted at this time. Patient is here for further evaluation and treatment.   Past Medical History:  Diagnosis Date  . Acute respiratory failure ([place])   . Anemia    previous blood transfusions  . Arthritis    "all over"  . Asthma   . Atrial flutter ([place])   . Bradycardia    requiring previous d/c of BB and reduction of amiodarone  . CAD (coronary artery disease)    nonobstructive per notes  . Chronic diastolic CHF (congestive heart failure) ([place])   . CKD (chronic kidney disease), stage III   . Complication of blood transfusion    "got the wrong blood type at [facility] in ~ 2015; no adverse reaction that we are aware of"/daughter, [name] (01/27/2016)  . COPD (chronic obstructive pulmonary disease) ([place])   . Depression    "light case"  . DVT (deep venous thrombosis) ([place]) 01/2016   a. LLE DVT 01/2016 - switched from Eliquis to Coumadin.  [name] Dyspnea    with some activity  . Gastric stenosis    a. s/p stomach tube  . GERD (gastroesophageal reflux disease)   . History of blood transfusion    "several" (01/27/2016)  . History of stomach ulcers   . Hyperlipidemia   . Hypertension   . Hypothyroidism   . On home oxygen therapy    "2L; 9PM til 9AM" (06/27/2017)  . PAD (peripheral artery disease) ([place])    a. prior gangrene, toe amputations, intervention.  [name] PAF (paroxysmal atrial fibrillation) ([place])   . Paraesophageal hernia   . Perforated gastric ulcer ([place])   . Pneumonia    "a few times" (06/27/2017)  . Seasonal allergies   . SIADH (syndrome of inappropriate ADH production) ([place])    [name] 01/10/2015  . Small bowel obstruction ([place])    "I don't know how many" (01/11/2015)  . Stroke [facility])    several- no residual  . Type II diabetes mellitus ([place])    "related to prednisone use  for > 20 yr; once predinose stopped; no more DM RX" (01/27/2016)  . UTI (urinary tract infection) 02/08/2016  . Ventral hernia with bowel obstruction       Objective/Physical Exam General: The patient is alert and oriented x3 in no acute distress.  Dermatology:  Wounds noted to 2nd and 3rd digits of the right foot, both measuring 0.5 x 0.5 x 0.1 cm (LxWxD).   To the noted ulceration(s), there is no eschar. There is a moderate amount of slough, fibrin, and necrotic tissue noted. Granulation tissue and wound base is red. There is a minimal amount of serosanguineous drainage noted. There is no exposed bone muscle-tendon ligament or joint. There is no malodor. Periwound integrity is intact. Skin is warm, dry and supple bilateral lower extremities.  Vascular: Palpable pedal pulses bilaterally. No edema or erythema noted. Capillary refill within normal limits.  Neurological: Epicritic and protective threshold diminished bilaterally.   Musculoskeletal Exam: Range of motion within normal limits to all pedal and ankle joints bilateral. Muscle strength 5/5 in all groups bilateral.   Assessment: 1. Ulcerations noted to digits 2 and 3 secondary to diabetes mellitus 2. diabetes mellitus w/ peripheral neuropathy   Plan of Care:  1. Patient  was evaluated. 2. Medically necessary excisional debridement including subcutaneous tissue was performed using a tissue nipper and a chisel blade. Excisional debridement of all the necrotic nonviable tissue down to healthy bleeding viable tissue was performed with post-debridement measurements same as pre-. 3. The wound was cleansed and dry sterile dressing applied. 4. Continue using Gentamicin cream.  5. Return to clinic in 6 weeks.    Edrick Kins, DPM [facility]  Dr. Edrick Kins, [address]                                          [place], [place] [address]                Office [phone]  Fax [phone]

## 2019-06-08 IMAGING — XA IR GI TUBE CONTRAST INJECTION
3 series · 13 of 13 positions shown · non-contrast
Comparison: Fluoroscopic guided jejunostomy catheter exchange -
11/14/2016;

INDICATION: History of chronic feeding jejunostomy catheter now with abdominal
pain.

Please perform fluoroscopic guided jejunostomy catheter injection
and potential exchange.
EXAM:
FLUOROSCOPIC GUIDED INJECTION OF JEJUNOSTOMY TUBE

[Series 3: fl - angio · 4 of 65 frames shown (1 of 2)]
[frame 10/65]
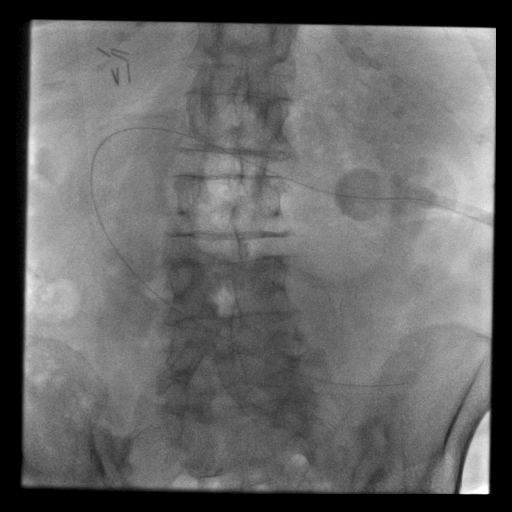
[frame 16/65]
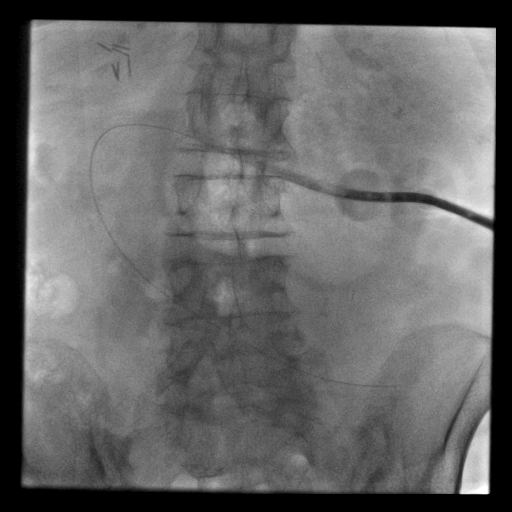
[frame 33/65]
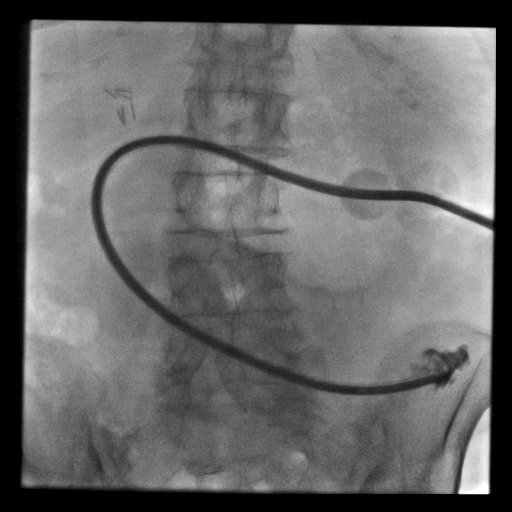
[frame 56/65]
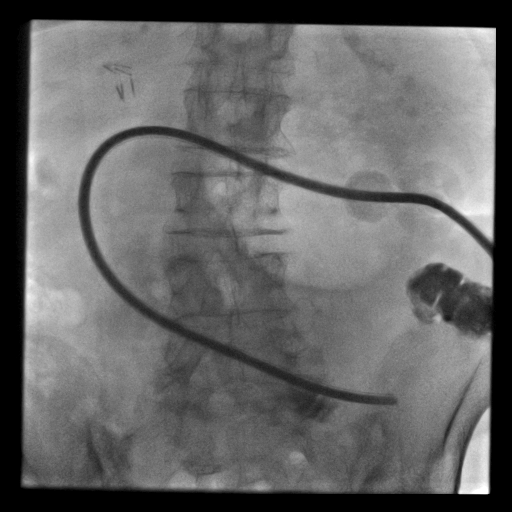

[Series 5: fl - angio · 4 of 60 frames shown (2 of 2)]
[frame 5/60]
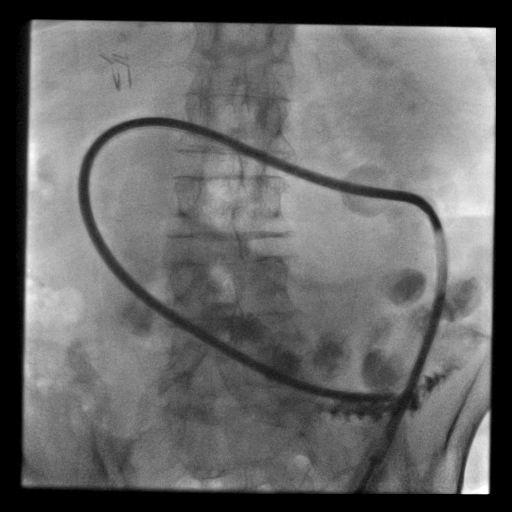
[frame 10/60]
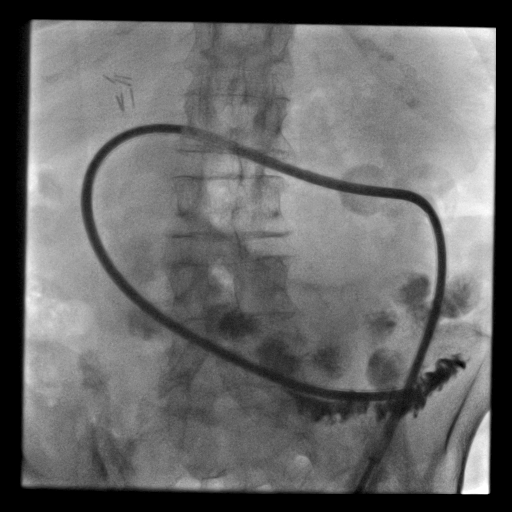
[frame 31/60]
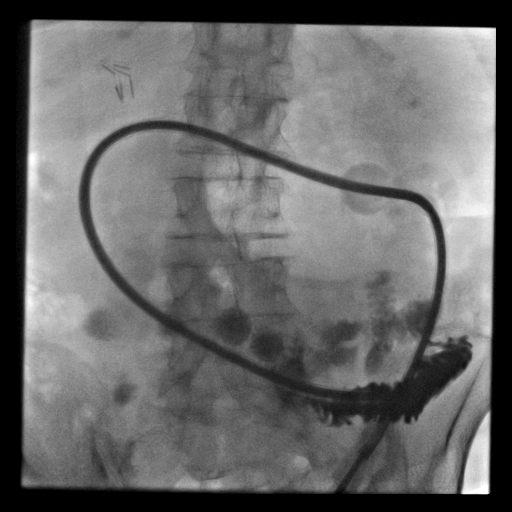
[frame 52/60]
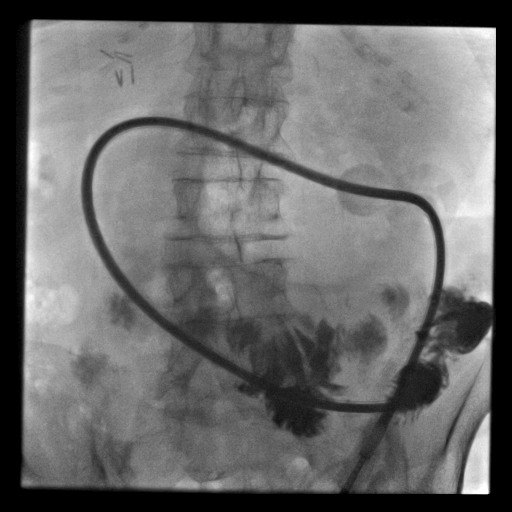

[Series 300: tube placements · 5 of 5 slices shown]
[im 1/5]
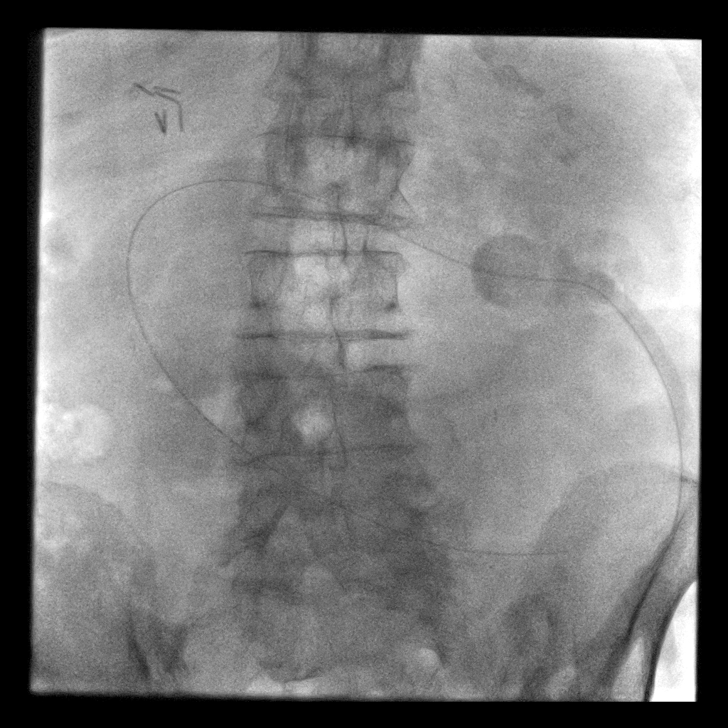
[im 2/5]
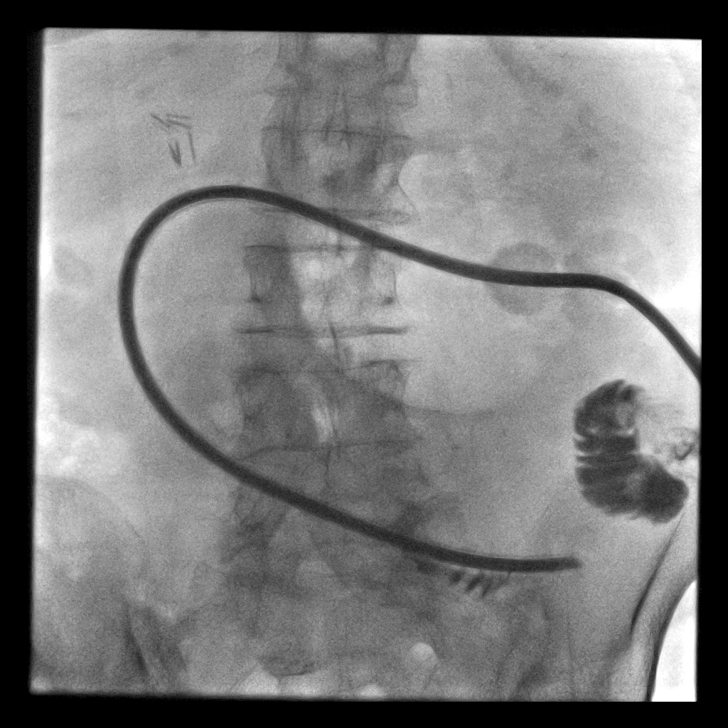
[im 3/5]
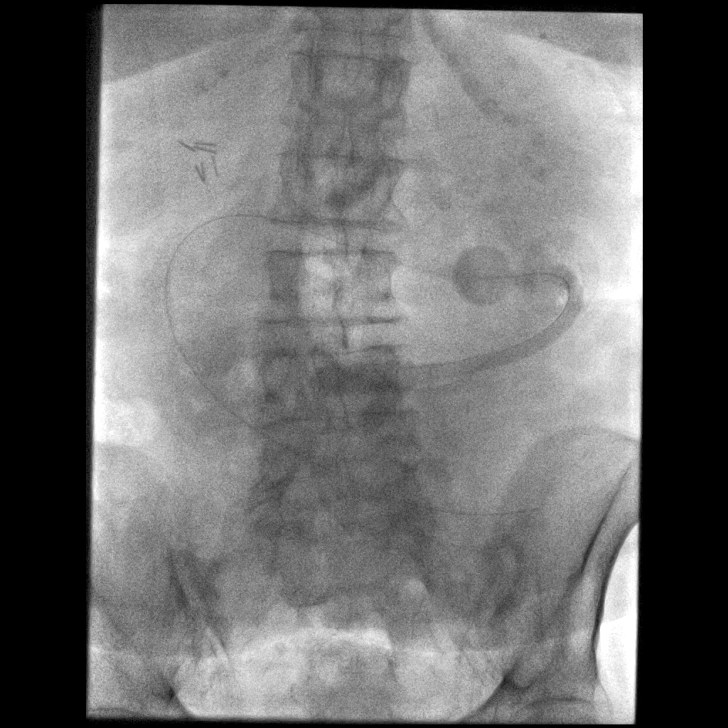
[im 4/5]
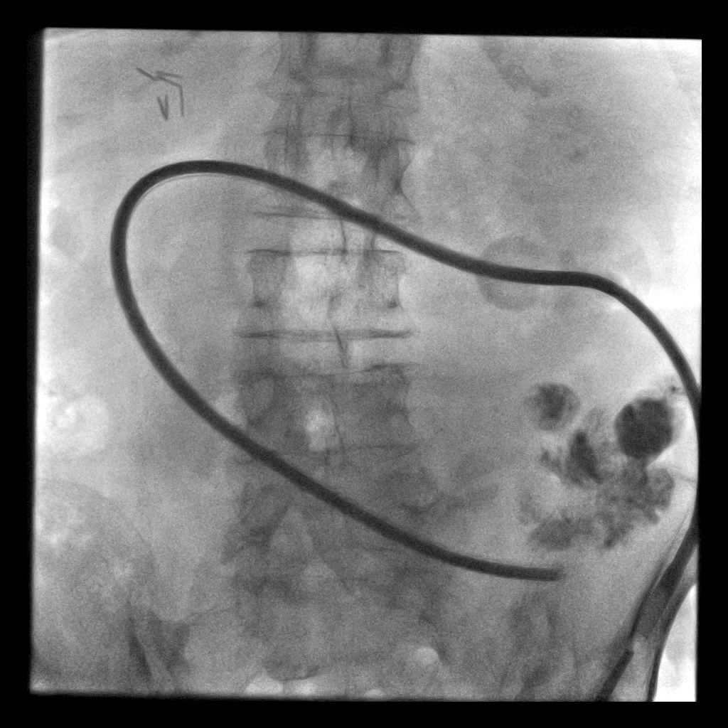
[im 5/5]
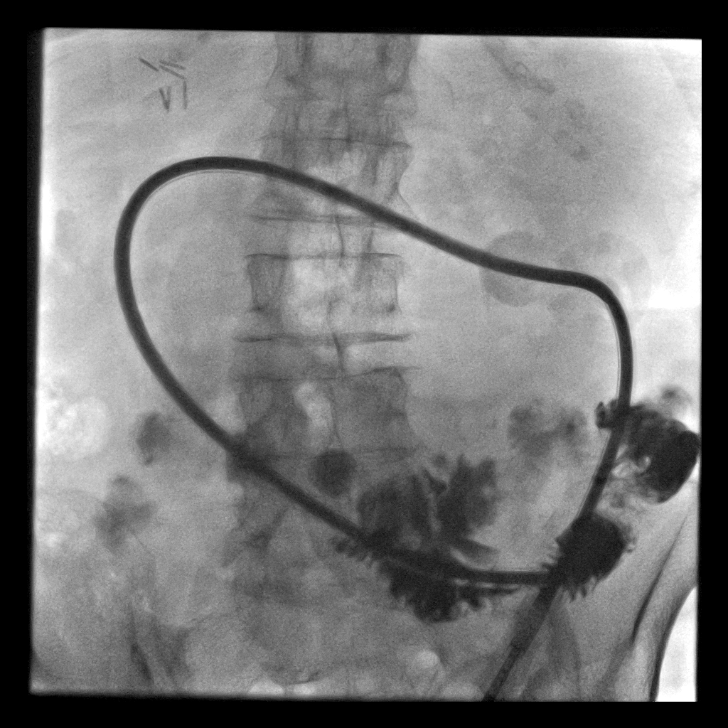

[13 of 13 positions shown; findings below may reference images not displayed]

soft tissue abdominal ultrasound - 12/19/2016

MEDICATIONS:
None.

CONTRAST:  20 cc Isovue 300 - administered into the jejunum.

FLUOROSCOPY TIME:  24 seconds (4.7 mGy)

COMPLICATIONS:
None immediate.

PROCEDURE:
The patient was positioned supine on the fluoroscopy table.

The patient's abdominal pain was noted to be located remote from the
jejunostomy entrance site.

A preprocedural spot fluoroscopic image was obtained.

Multiple spot fluoroscopic images were obtained from the injection
of a small amount of contrast via the existing jejunostomy catheter.

Images reviewed in the procedure was terminated. The catheter was
flushed with a small amount of saline. A dressing was placed. The
patient tolerated the procedure well immediate postprocedural
complication.
FINDINGS: Unchanged positioning of jejunostomy catheter with tip overlying
expected location of the mid small bowel.

Contrast injection demonstrates appropriate positioning and
functionality of the jejunostomy catheter. There is brisk passage of
contrast from the distal end of the catheter into the more distal
small bowel.
IMPRESSION: 1. Appropriately positioned and functioning jejunostomy catheter. No
exchange performed.
2. Note, on exam, the patient's abdominal pain appears remote from
the feeding tube. As recent soft tissue ultrasound was unrevealing,
if patient's abdominal pain persists, further evaluation with CT of
the abdomen pelvis could be performed as indicated.

## 2019-06-09 NOTE — Telephone Encounter (Signed)
Shannon [name] called [facility] and spoke with pharmacy about medications being converted to liquid.   Per Shannon [name] the pharmacist stated they will call back once they look into what medications can be converted.  I received a secure chat message from [facility], [place] stating the pharmacy left message on clinical intake voicemail requesting return call, no additional information was given.   I returned called back and the pharmacist stated 4 medications are available in liquid form:  1.) Iron 2.) Furosemide 40mg /70mL 3.) Zoloft 20mg /mL 4.) Gabapentin 250 mg/63mL  If patient would like other medications she will need to get from a compound pharmacy.  Message routed to [name], NP   Please send response to clinical intake assistant

## 2019-06-10 ENCOUNTER — Telehealth: Payer: Self-pay | Admitting: Pulmonary Disease

## 2019-06-10 MED ORDER — FUROSEMIDE 8 MG/ML PO SOLN: ORAL | 12 refills | Status: DC

## 2019-06-10 MED ORDER — GABAPENTIN 250 MG/5ML PO SOLN: 300.0000 mg | Freq: Two times a day (BID) | ORAL | 1 refills | Status: DC

## 2019-06-10 MED ORDER — FERROUS SULFATE 220 (44 FE) MG/5ML PO ELIX: 325.0000 mg | ORAL_SOLUTION | Freq: Every day | ORAL | 3 refills | Status: DC

## 2019-06-10 MED ORDER — SERTRALINE HCL 20 MG/ML PO CONC: 50.0000 mg | Freq: Every day | ORAL | 12 refills | Status: DC

## 2019-06-10 NOTE — Telephone Encounter (Signed)
The following medications have been sent to the pharmacy is solution form. The others would have to be compounded at a speciality pharmacy and coumadin can not be in solution form per pharmacy. If she had a compound pharmacy she could like [place] to consult with we can try to figure out a way to get the other medications in a solution form.  Also would recommend that she is crushing the tablets well and flushing thoroughly with each medication administration to prevent damage to the tube.   1) Iron 2.) Furosemide 40mg /11mL 3.) Zoloft 20mg /mL 4.) Gabapentin 250 mg/35mL

## 2019-06-10 NOTE — Telephone Encounter (Signed)
Patient daughter, [name] notified and agreed.

## 2019-06-10 NOTE — Telephone Encounter (Signed)
Per patient's chart, it looks like an order was placed on 2/25 for the patient's O2 to be discontinued. Will print the original order and fax to Ron.   Nothing further needed at time of call.

## 2019-06-11 ENCOUNTER — Ambulatory Visit (INDEPENDENT_AMBULATORY_CARE_PROVIDER_SITE_OTHER): Payer: Medicare Other | Admitting: Nurse Practitioner

## 2019-06-11 ENCOUNTER — Other Ambulatory Visit: Payer: Self-pay

## 2019-06-11 ENCOUNTER — Encounter: Payer: Self-pay | Admitting: Nurse Practitioner

## 2019-06-11 ENCOUNTER — Telehealth: Payer: Self-pay

## 2019-06-11 VITALS — BP 146/70 | HR 64 | Temp 97.3°F | Ht 66.0 in | Wt 138.0 lb

## 2019-06-11 DIAGNOSIS — Z934 Other artificial openings of gastrointestinal tract status: Secondary | ICD-10-CM

## 2019-06-11 DIAGNOSIS — I5032 Chronic diastolic (congestive) heart failure: Secondary | ICD-10-CM | POA: Diagnosis not present

## 2019-06-11 DIAGNOSIS — L97519 Non-pressure chronic ulcer of other part of right foot with unspecified severity: Secondary | ICD-10-CM | POA: Diagnosis not present

## 2019-06-11 DIAGNOSIS — J4489 Other specified chronic obstructive pulmonary disease: Secondary | ICD-10-CM

## 2019-06-11 DIAGNOSIS — E032 Hypothyroidism due to medicaments and other exogenous substances: Secondary | ICD-10-CM | POA: Diagnosis not present

## 2019-06-11 DIAGNOSIS — I83015 Varicose veins of right lower extremity with ulcer other part of foot: Secondary | ICD-10-CM

## 2019-06-11 DIAGNOSIS — E1122 Type 2 diabetes mellitus with diabetic chronic kidney disease: Secondary | ICD-10-CM

## 2019-06-11 DIAGNOSIS — I739 Peripheral vascular disease, unspecified: Secondary | ICD-10-CM | POA: Diagnosis not present

## 2019-06-11 DIAGNOSIS — N1832 Chronic kidney disease, stage 3b: Secondary | ICD-10-CM

## 2019-06-11 DIAGNOSIS — E440 Moderate protein-calorie malnutrition: Secondary | ICD-10-CM | POA: Diagnosis not present

## 2019-06-11 DIAGNOSIS — E1121 Type 2 diabetes mellitus with diabetic nephropathy: Secondary | ICD-10-CM

## 2019-06-11 DIAGNOSIS — J449 Chronic obstructive pulmonary disease, unspecified: Secondary | ICD-10-CM

## 2019-06-11 DIAGNOSIS — K219 Gastro-esophageal reflux disease without esophagitis: Secondary | ICD-10-CM | POA: Diagnosis not present

## 2019-06-11 DIAGNOSIS — Z89422 Acquired absence of other left toe(s): Secondary | ICD-10-CM | POA: Diagnosis not present

## 2019-06-11 DIAGNOSIS — R001 Bradycardia, unspecified: Secondary | ICD-10-CM | POA: Diagnosis not present

## 2019-06-11 DIAGNOSIS — Z794 Long term (current) use of insulin: Secondary | ICD-10-CM

## 2019-06-11 MED ORDER — PANTOPRAZOLE SODIUM 40 MG PO PACK: 40.0000 mg | PACK | Freq: Two times a day (BID) | ORAL | 4 refills | Status: DC

## 2019-06-11 NOTE — Progress Notes (Signed)
Careteam: Patient Care Team: [name], NP as PCP - General (Geriatric Medicine) Dorothy Spark, MD as PCP - Cardiology (Cardiology) Edrick Kins, DPM as Consulting Physician (Podiatry) Dorothy Spark, MD as Consulting Physician (Cardiology) [name], NP as Nurse Practitioner (Geriatric Medicine) Camillo Flaming, [place] as Referring Physician (Optometry) Pleasant, Eppie Gibson, RN as [place] Management  PLACE OF SERVICE:  [place] Directive information Does Patient Have a Medical Advance Directive?: Yes, Type of Advance Directive: Healthcare Power of Attorney, Does patient want to make changes to medical advance directive?: No - Patient declined  Allergies  Allergen Reactions  . Penicillins Itching, Rash and Other (See Comments)    Tolerated amoxicillin, unasyn, zosyn & cephalosporins in the past. Did it involve swelling of the face/tongue/throat, SOB, or low BP? No Did it involve sudden or severe rash/hives, skin peeling, or any reaction on the inside of your mouth or nose? No Did you need to seek medical attention at a hospital or doctor's office? Unknown When did it last happen?5+ years If all above answers are "NO", may proceed with cephalosporin use.     Chief Complaint  Patient presents with  . Medical Management of Chronic Issues    4 month follow-up   . Feeding Tube    Feeding tube is sore and patient is unable to each much. tube was replaced last week . Patient also states everything she eats goes right through, drains. Patiernt with weight loss   . Fatigue    Patient gets tired quickly   . Arthritis    Patient c/o arthritis pain in shoulders     HPI: Patient is a 86 y.o. female for routine follow up.   Continues to follow up with podiatrist for routine foot care and ulcers of the right foot. Went on 3/3 for debridement.   Hx of DVT- continues with coumadin clinic  Recurrent vasovagel syncope- has  followed up with cardiology in regards to this, echo unchanged, carotid dopplers with no signficant stenosis , ct negative.   Bradycardia- coreg stopped after this 11/2018. NSR during hospitalization.   A fib- persistent- coreg stopped due to bradycardia, continues on coumadin for anticoagulation.   CKD- worse during hospitalization,  Improved on last labs.    COPD- stable on current regimen, following with pulmonary and continues on dulera, xopenox and dueneb PRN   DM- a1c at goal in November- no hypoglycemic episodes.   Neuropathy- was seen in office acutely due to worsening pain this is not bothering her as bad at this time.   Pyloric stenosis with j tube- also concern for dysphagia- ST was consulted during last hospitalization and recommended to use J tube for all medication   When she eats the tube over follows at the site, radiology gave her another tube to take the overflow. Reports it is okay that it goes into the bag but feeling like she is still hungry after a feeding. Some days she is not losing a lot and other days she gets half a bag which is significant.  She is losing weight- about a lb a week per daughter. Getting nutrition via peg as well.   Review of Systems:  Review of Systems  Constitutional: Negative for chills and fever.  Respiratory: Negative for cough and shortness of breath.   Cardiovascular: Negative for chest pain, palpitations and leg swelling.  Gastrointestinal: Positive for abdominal pain (to J site- unchanged, no redness to site). Negative  for blood in stool, constipation, diarrhea and heartburn.  Genitourinary: Negative for dysuria, frequency and urgency.  Musculoskeletal: Positive for joint pain and myalgias. Negative for falls.  Neurological: Negative for dizziness and headaches.  Psychiatric/Behavioral: Negative for depression. The patient is not nervous/anxious and does not have insomnia.     Past Medical History:  Diagnosis Date  . Acute  respiratory failure ([place])   . Anemia    previous blood transfusions  . Arthritis    "all over"  . Asthma   . Atrial flutter ([place])   . Bradycardia    requiring previous d/c of BB and reduction of amiodarone  . CAD (coronary artery disease)    nonobstructive per notes  . Chronic diastolic CHF (congestive heart failure) ([place])   . CKD (chronic kidney disease), stage III   . Complication of blood transfusion    "got the wrong blood type at [facility] in ~ 2015; no adverse reaction that we are aware of"/daughter, [name] (01/27/2016)  . COPD (chronic obstructive pulmonary disease) ([place])   . Depression    "light case"  . DVT (deep venous thrombosis) ([place]) 01/2016   a. LLE DVT 01/2016 - switched from Eliquis to Coumadin.  [name] Dyspnea    with some activity  . Gastric stenosis    a. s/p stomach tube  . GERD (gastroesophageal reflux disease)   . History of blood transfusion    "several" (01/27/2016)  . History of stomach ulcers   . Hyperlipidemia   . Hypertension   . Hypothyroidism   . On home oxygen therapy    "2L; 9PM til 9AM" (06/27/2017)  . PAD (peripheral artery disease) ([place])    a. prior gangrene, toe amputations, intervention.  [name] PAF (paroxysmal atrial fibrillation) ([place])   . Paraesophageal hernia   . Perforated gastric ulcer ([place])   . Pneumonia    "a few times" (06/27/2017)  . Seasonal allergies   . SIADH (syndrome of inappropriate ADH production) ([place])    [name] 01/10/2015  . Small bowel obstruction ([place])    "I don't know how many" (01/11/2015)  . Stroke [facility])    several- no residual  . Type II diabetes mellitus ([place])    "related to prednisone use  for > 20 yr; once predinose stopped; no more DM RX" (01/27/2016)  . UTI (urinary tract infection) 02/08/2016  . Ventral hernia with bowel obstruction    Past Surgical History:  Procedure Laterality Date  . ABDOMINAL AORTOGRAM N/A 09/21/2016   Procedure: Abdominal Aortogram;  Surgeon: Waynetta Sandy, MD;  Location: [facility];  Service: Cardiovascular;  Laterality: N/A;  . AMPUTATION Left 09/25/2016   Procedure: AMPUTATION DIGIT- LEFT 2ND AND 3RD TOES;  Surgeon: Rosetta Posner, MD;  Location: [facility];  Service: Vascular;  Laterality: Left;  . AMPUTATION Right 05/03/2018   Procedure: AMPUTATION RIGHT GREAT TOE;  Surgeon: Rosetta Posner, MD;  Location: [facility];  Service: Vascular;  Laterality: Right;  . CATARACT EXTRACTION W/ INTRAOCULAR LENS  IMPLANT, BILATERAL    . CHOLECYSTECTOMY OPEN    . COLECTOMY    . ESOPHAGOGASTRODUODENOSCOPY N/A 01/19/2014   Procedure: ESOPHAGOGASTRODUODENOSCOPY (EGD);  Surgeon: Irene Shipper, MD;  [facility];  Service: Endoscopy;  Laterality: N/A;  . ESOPHAGOGASTRODUODENOSCOPY N/A 01/20/2014   Procedure: ESOPHAGOGASTRODUODENOSCOPY (EGD);  Surgeon: Irene Shipper, MD;  Location[facility];  Service: Endoscopy;  Laterality: N/A;  . ESOPHAGOGASTRODUODENOSCOPY N/A 03/19/2014   Procedure: ESOPHAGOGASTRODUODENOSCOPY (EGD);  Surgeon: Milus Banister, MD;  [facility];  Service: Endoscopy;  Laterality: N/A;  . ESOPHAGOGASTRODUODENOSCOPY N/A 07/08/2015   Procedure: ESOPHAGOGASTRODUODENOSCOPY (EGD);  Surgeon: Doran Stabler, MD;  Location[facility];  Service: Endoscopy;  Laterality: N/A;  . ESOPHAGOGASTRODUODENOSCOPY (EGD) WITH PROPOFOL N/A 09/15/2015   Procedure: ESOPHAGOGASTRODUODENOSCOPY (EGD) WITH PROPOFOL;  Surgeon: Manus Gunning, MD;  [facility];  Service: Gastroenterology;  Laterality: N/A;  . GASTROJEJUNOSTOMY     hx/notes 01/10/2015  . GASTROJEJUNOSTOMY N/A 09/23/2015   Procedure: OPEN GASTROJEJUNOSTOMY TUBE PLACEMENT;  Surgeon: Arta Bruce Kinsinger, MD;  [facility];  Service: General;  Laterality: N/A;  . GLAUCOMA SURGERY Bilateral   . HERNIA REPAIR  2015  . IR CM INJ ANY COLONIC TUBE W/FLUORO  01/05/2017  . IR CM INJ ANY COLONIC TUBE W/FLUORO  06/07/2017  . IR CM INJ ANY COLONIC TUBE W/FLUORO  11/05/2017  . IR CM INJ ANY COLONIC  TUBE W/FLUORO  09/18/2018  . IR CM INJ ANY COLONIC TUBE W/FLUORO  03/06/2019  . IR CM INJ ANY COLONIC TUBE W/FLUORO  03/11/2019  . IR GENERIC HISTORICAL  01/07/2016   IR GJ TUBE CHANGE 01/07/2016 Jacqulynn Cadet, MD [facility]  . IR GENERIC HISTORICAL  01/27/2016   IR MECH REMOV OBSTRUC MAT ANY COLON TUBE W/FLUORO 01/27/2016 Markus Daft, MD [facility]  . IR GENERIC HISTORICAL  02/07/2016   IR PATIENT EVAL TECH 0-60 MINS Darrell K Allred, PA-C [facility]  . IR GENERIC HISTORICAL  02/08/2016   IR GJ TUBE CHANGE 02/08/2016 Greggory Keen, MD [facility]  . IR GENERIC HISTORICAL  01/06/2016   IR GJ TUBE CHANGE 01/06/2016 [facility] OUT REF  . IR GENERIC HISTORICAL  05/02/2016   IR CM INJ ANY COLONIC TUBE W/FLUORO 05/02/2016 Arne Cleveland, MD [facility]  . IR GENERIC HISTORICAL  05/15/2016   IR GJ TUBE CHANGE 05/15/2016 Sandi Mariscal, MD [facility]  . IR GENERIC HISTORICAL  06/28/2016   IR GJ TUBE CHANGE 06/28/2016 [facility]  . IR GJ TUBE CHANGE  02/20/2017  . IR GJ TUBE CHANGE  05/10/2017  . IR GJ TUBE CHANGE  07/06/2017  . IR GJ TUBE CHANGE  08/02/2017  . IR GJ TUBE CHANGE  10/15/2017  . IR GJ TUBE CHANGE  12/26/2017  . IR [place] TUBE CHANGE  04/08/2018  . IR [place] TUBE CHANGE  06/19/2018  . IR [place] TUBE CHANGE  03/03/2019  . IR [place] TUBE CHANGE  06/06/2019  . IR PATIENT EVAL TECH 0-60 MINS  10/19/2016  . IR PATIENT EVAL TECH 0-60 MINS  12/25/2016  . IR PATIENT EVAL TECH 0-60 MINS  01/29/2017  . IR PATIENT EVAL TECH 0-60 MINS  04/04/2017  . IR PATIENT EVAL TECH 0-60 MINS  04/30/2017  . IR PATIENT EVAL TECH 0-60 MINS  07/31/2017  . IR REPLC DUODEN/JEJUNO TUBE PERCUT W/FLUORO  11/14/2016  . LAPAROTOMY N/A 01/20/2015   Procedure: EXPLORATORY LAPAROTOMY;  Surgeon: Coralie Keens, MD;  Location: [facility];  Service: General;  Laterality: N/A;  . LOWER EXTREMITY ANGIOGRAPHY Left 09/21/2016   Procedure: Lower Extremity Angiography;  Surgeon: Waynetta Sandy, MD;  Location: [facility];  Service:  Cardiovascular;  Laterality: Left;  . LOWER EXTREMITY ANGIOGRAPHY Right 06/27/2017   Procedure: Lower Extremity Angiography;  Surgeon: Waynetta Sandy, MD;  Location: [facility];  Service: Cardiovascular;  Laterality: Right;  . LYSIS OF ADHESION N/A 01/20/2015   Procedure: LYSIS OF ADHESIONS < 1 HOUR;  Surgeon: Coralie Keens, MD;  Location: [facility];  Service: General;  Laterality: N/A;  . PERIPHERAL VASCULAR BALLOON ANGIOPLASTY Left 09/21/2016   Procedure: Peripheral Vascular Balloon Angioplasty;  Surgeon: Waynetta Sandy, MD;  Location: [facility];  Service: Cardiovascular;  Laterality: Left;  drug coated balloon  . PERIPHERAL VASCULAR BALLOON ANGIOPLASTY Right 06/27/2017   Procedure: PERIPHERAL VASCULAR BALLOON ANGIOPLASTY;  Surgeon: Waynetta Sandy, MD;  Location: [facility];  Service: Cardiovascular;  Laterality: Right;  SFA/TPTRUNK  . TONSILLECTOMY    . TUBAL LIGATION    . VENTRAL HERNIA REPAIR  2015   incarcerated ventral hernia ([facility] 09/2013)/notes 01/10/2015   Social History:   reports that she has never smoked. She quit smokeless tobacco use about 39 years ago.  Her smokeless tobacco use included snuff. She reports that she does not drink alcohol or use drugs.  Family History  Problem Relation Age of Onset  . Stroke Mother   . Hypertension Mother   . Diabetes Brother   . Glaucoma Son   . Glaucoma Son   . Heart attack Neg Hx   . Colon cancer Neg Hx   . Stomach cancer Neg Hx     Medications: Patient's Medications  New Prescriptions   No medications on file  Previous Medications   ACETAMINOPHEN (TYLENOL) 500 MG TABLET    Take 500 mg by mouth every 6 (six) hours as needed (pain).    AMINO ACIDS-PROTEIN HYDROLYS (FEEDING SUPPLEMENT, PRO-STAT SUGAR FREE 64,) LIQD    Take 30 mLs by mouth 2 (two) times daily.   AMLODIPINE (NORVASC) 5 MG TABLET    Take 1 tablet (5 mg total) by mouth daily.   ATORVASTATIN (LIPITOR) 10 MG TABLET     Take 1 tablet (10 mg total) by mouth daily.   B COMPLEX VITAMINS TABLET    Take 1 tablet by mouth daily.    CALCIUM-VITAMIN D (OSCAL WITH D) 500-200 MG-UNIT PER TABLET    Take 1 tablet by mouth daily with breakfast.    CARBOXYMETHYLCELLULOSE SODIUM (ARTIFICIAL TEARS OP)    Place 1 drop into both eyes 4 (four) times daily.   CYCLOSPORINE (RESTASIS) 0.05 % OPHTHALMIC EMULSION    USE 1 DROP INTO BOTH EYES TWICE DAILY   DULERA 200-5 MCG/ACT AERO    TAKE TWO PUFFS BY MOUTH TWICE DAILY   FEEDING SUPPLEMENT (OSMOLITE 1 CAL) LIQD    Take 711 mLs by mouth See admin instructions. For 12 hours 2100 - 0900   FERROUS SULFATE 220 (44 FE) MG/5ML SOLUTION    Take 7.4 mLs (325 mg total) by mouth daily.   FLUTICASONE (FLONASE) 50 MCG/ACT NASAL SPRAY    SPRAY 2 SPRAYS INTO EACH NOSTRIL EVERY DAY   FUROSEMIDE (LASIX) 8 MG/ML SOLUTION    20 mg (2.5 ml) daily unless swelling occurs take 40 mg (5 ml) and then switch back to 20 mg daily   GABAPENTIN (NEURONTIN) 250 MG/5ML SOLUTION    Take 6 mLs (300 mg total) by mouth 2 (two) times daily.   GENTAMICIN CREAM (GARAMYCIN) 0.1 %    Apply 1 application topically 3 (three) times daily as needed (dry skin).   HYDRALAZINE (APRESOLINE) 50 MG TABLET    TAKE 1 TABLET BY MOUTH THREE TIMES A DAY   INSULIN ASPART (NOVOLOG) 100 UNIT/ML FLEXPEN    Inject 0-6 Units into the skin 3 (three) times daily with meals. New sliding Scale If BS is 151 - 251 = 2 units,  If BS is 251 -  350 = 4 units,  If BS is 351 - 450 = 6 units Call provider if BS runs more than 400   INSULIN GLARGINE (LANTUS SOLOSTAR) 100 UNIT/ML SOLOSTAR PEN    Inject 8 Units into the skin at bedtime.   INSULIN PEN NEEDLE (B-D UF III MINI PEN NEEDLES) 31G X 5 MM MISC    Use twice daily with giving insulin injections. Dx E11.9   IPRATROPIUM-ALBUTEROL (DUONEB) 0.5-2.5 (3) MG/3ML SOLN    Take 3 mLs by nebulization every 6 (six) hours as needed (sob). Use 3 times daily x 4 days then every 6 hours as needed.   ISOSORBIDE MONONITRATE  (IMDUR) 30 MG 24 HR TABLET    TAKE 2 TABLETS BY MOUTH DAILY. PLEASE MAKE ANNUAL APPT WITH DR. Meda Coffee FOR FUTURE REFILLS. THANK YOU. 1ST ATTEMPT.   LATANOPROST (XALATAN) 0.005 % OPHTHALMIC SOLUTION    Place 1 drop into both eyes at bedtime.   LEVALBUTEROL (XOPENEX HFA) 45 MCG/ACT INHALER    Inhale 2 puffs into the lungs every 6 (six) hours as needed for wheezing or shortness of breath.   LEVOTHYROXINE (SYNTHROID) 125 MCG TABLET    TAKE 1 TABLET BY MOUTH EVERY DAY   LORATADINE (CLARITIN) 10 MG TABLET    Take 10 mg by mouth daily as needed for allergies.   MULTIPLE VITAMIN (MULTIVITAMIN WITH MINERALS) TABS TABLET    Take 1 tablet by mouth daily.   NYSTATIN (MYCOSTATIN/NYSTOP) POWDER    Apply 1 g topically as needed (skin irritation).    OXYCODONE (ROXICODONE) 5 MG IMMEDIATE RELEASE TABLET    Take 1 tablet (5 mg total) by mouth every 4 (four) hours as needed for severe pain. May take 1/2 tab q4hrs as needed for mild pain.   PANTOPRAZOLE (PROTONIX) 40 MG TABLET    TAKE 1 TABLET BY MOUTH TWICE A DAY   PROBIOTIC PRODUCT (EQL PROBIOTIC COLON SUPPORT PO)    Take 1 capsule by mouth daily.   SERTRALINE (ZOLOFT) 20 MG/ML CONCENTRATED SOLUTION    Take 2.5 mLs (50 mg total) by mouth daily.   WARFARIN (COUMADIN) 2.5 MG TABLET    Take 2-3 tablets by mouth daily as directed by Coumadin clinic.   WATER FOR IRRIGATION, STERILE (FREE WATER) SOLN    Place 180 mLs into feeding tube 3 (three) times daily.    WHITE PETROLATUM-MINERAL OIL (SYSTANE NIGHTTIME) OINT    Place 1 application into both eyes at bedtime.   Modified Medications   No medications on file  Discontinued Medications   OXYGEN    Inhale 2 L into the lungs at bedtime.    Physical Exam:  Vitals:   06/11/19 1115  BP: (!) 146/70  Pulse: 64  Temp: (!) 97.3 F (36.3 C)  TempSrc: Temporal  SpO2: 99%  Weight: 138 lb (62.6 kg)  Height: 5\' 6"  (1.676 m)   Body mass index is 22.27 kg/m. Wt Readings from Last 3 Encounters:  06/11/19 138 lb (62.6 kg)   05/21/19 140 lb 12 oz (63.8 kg)  05/20/19 135 lb (61.2 kg)    Physical Exam Constitutional:      General: She is not in acute distress.    Appearance: She is well-developed. She is not diaphoretic.  HENT:     Head: Normocephalic and atraumatic.     Mouth/Throat:     Pharynx: No oropharyngeal exudate.  Eyes:     Conjunctiva/sclera: Conjunctivae normal.     Pupils: Pupils are equal, round, and reactive to light.  Cardiovascular:  Rate and Rhythm: Normal rate and regular rhythm.     Heart sounds: Normal heart sounds.  Pulmonary:     Effort: Pulmonary effort is normal.     Breath sounds: Normal breath sounds.  Abdominal:     General: Bowel sounds are normal.     Palpations: Abdomen is soft.     Comments: J tube intact   Musculoskeletal:        General: No tenderness.     Cervical back: Normal range of motion and neck supple.     Comments: Boot to right foot   Skin:    General: Skin is warm and dry.  Neurological:     Mental Status: She is alert and oriented to person, place, and time.  Psychiatric:        Mood and Affect: Mood normal.        Behavior: Behavior normal.     Labs reviewed: Basic Metabolic Panel: Recent Labs    10/15/18 1122 11/24/18 2036 05/18/19 1925 05/18/19 2355 05/27/19 1213  NA 138   < > 134* 135 135  K 4.7   < > 4.3 4.5 4.6  CL 101   < > 101 103 100  CO2 31   < > 24 24 28   GLUCOSE 100*   < > 80 82 80  BUN 68*   < > 60* 60* 58*  CREATININE 1.57*   < > 1.75* 1.72* 1.49*  CALCIUM 10.1   < > 9.4 9.6 9.8  MG  --   --  2.2  --   --   TSH 0.98  --   --   --   --    < > = values in this interval not displayed.   Liver Function Tests: Recent Labs    12/25/18 1518 12/25/18 1518 12/26/18 0533 05/18/19 1925 05/27/19 1213  AST 27   < > 24 28 24   ALT 20   < > 19 21 16   ALKPHOS 87  --  84 89  --   BILITOT 0.5   < > 0.6 0.7 0.4  PROT 7.9   < > 7.4 8.0 7.6  ALBUMIN 3.8  --  3.5 3.9  --    < > = values in this interval not displayed.    Recent Labs    08/27/18 1100 05/18/19 1925  LIPASE 24 22  AMYLASE 60  --    No results for input(s): AMMONIA in the last 8760 hours. CBC: Recent Labs    12/02/18 1555 12/25/18 1518 03/01/19 2145 03/02/19 0242 03/03/19 0355 05/18/19 1925 05/27/19 1213  WBC 5.7   < > 6.0   < > 4.6 7.0 4.5  NEUTROABS 3,631  --  3.6  --   --   --  2,651  HGB 9.6*   < > 9.4*   < > 9.0* 10.0* 9.9*  HCT 29.2*   < > 29.1*   < > 27.5* 29.9* 30.6*  MCV 94.8   < > 93.9   < > 93.5 92.3 93.6  PLT 225   < > 228   < > 217 200 235   < > = values in this interval not displayed.   Lipid Panel: Recent Labs    10/15/18 1122  CHOL 131  HDL 47*  LDLCALC 58  TRIG 188*  CHOLHDL 2.8   TSH: Recent Labs    10/15/18 1122  TSH 0.98   A1C: Lab Results  Component Value Date   HGBA1C 6.2 (  H) 03/02/2019     Assessment/Plan 1. Bradycardia -betablocker stopped, rate controlled.   2. Peripheral vascular disease ([place]) -ongoing, followed by podiatrist due to ulceration of right foot. Continues on coumadin for anticoagulation.   3. Jejunostomy tube present ([place]) -needing to give all medication via tube, iron has already been sent to pharmacy and advised not to crush, encouraged pharmacy consult to review medication and make sure all medications can safely be given via tube.  - pantoprazole sodium (PROTONIX) 40 mg/20 mL PACK; Place 20 mLs (40 mg total) into feeding tube 2 (two) times daily.  Dispense: 480 mL; Refill: 4 - Referral to Nutrition and Diabetes Services  4. S/P amputation of lesser toe, left (HCC) Due to PVD and diabetes, following with podiatry   5. Malnutrition of moderate degree (HCC) -losing weight and nutrition due to overflow of the J site.  - Referral to Nutrition and Diabetes Services for evaluation and adjustment of feeding asneeded  6. Varicose veins of right lower extremity with ulcer other part of foot ([place]) Stable continues to follow up with podiatry for  7. Chronic diastolic  CHF (congestive heart failure) (HCC) -stable, following with cardiology, no signs of fluid overload. Continues on lasix (recent changed to solution per daughter request)  8. COPD with asthma ([place]) -stable, following with pulmonary continues on dulera with PRN  9. Type 2 diabetes mellitus with stage 3b chronic kidney disease, with long-term current use of insulin (HCC) Currently holding all insulin and blood sugars at goal. Daughter monitoring blood sugars closing. No hypoglycemia noted.   10. Hypothyroidism due to medication -continues on synthroid, will follow up TSH at next OV  11. Gastroesophageal reflux disease without esophagitis -stable, will change protonix to solution form.  - pantoprazole sodium (PROTONIX) 40 mg/20 mL PACK; Place 20 mLs (40 mg total) into feeding tube 2 (two) times daily.  Dispense: 480 mL; Refill: 4  Next appt: 4 months.  [name]. Malcolm, [facility] [phone]

## 2019-06-11 NOTE — Telephone Encounter (Signed)
Patients daughter is calling in regards to her mother medication: Isosorbide and pantoprazole. Daughter unsure on how to administer medication, stating she is unable to crush them. Please advice and call back patients daughter at [phone].

## 2019-06-11 NOTE — Patient Instructions (Addendum)
PROTONIX, IRON- sent to pharmacy in elixer form.   Ask pharmacy about Isosorbide mononitrate I suspect it should not be crushed- also have them look at medication list to see if there are any other medication that need to be changed or not crushed.   Nutrition and Diabetes Education Services consulted due to  J tube and nutritional issues. If you do not hear back about referral in a week please call our office back and ask to speak with Lattie Haw (our referral coordinator) for an update.   4 weeks for MOST form completion   4 months for routine follow up.

## 2019-06-11 NOTE — Telephone Encounter (Signed)
Attempted to contact patient's daughter but there was no answer.

## 2019-06-12 ENCOUNTER — Telehealth: Payer: Self-pay | Admitting: *Deleted

## 2019-06-12 DIAGNOSIS — Z934 Other artificial openings of gastrointestinal tract status: Secondary | ICD-10-CM

## 2019-06-12 NOTE — Telephone Encounter (Signed)
Melissa with [facility] called and stated that they received the referral for patient for Malnutrition but they DO NOT see patient's with feeding tubes. Stated that they are closing the referral and won't be able to see patient.  I asked for any recommendations and she stated that she does not know anyone who would see her with feeding tube. Stated maybe [facility].  Please Advise.

## 2019-06-12 NOTE — Telephone Encounter (Signed)
Spoke with Lattie Haw, she needs referral to RD, new referrals place to hopefully get her to an RD to look at her calories and tube feedings since she is to get all nutrition via j tube.

## 2019-06-12 NOTE — Telephone Encounter (Addendum)
Left message for daughter to call back.  

## 2019-06-12 NOTE — Telephone Encounter (Signed)
I spoke with Lattie Haw and she is working on referral.

## 2019-06-12 NOTE — Telephone Encounter (Signed)
Shannon [name] wants pt to see Shannon [name], RD for consult. I reached out to office & spoke with Lenna Sciara Mickel Baas is out of office until [facility] 06/16/19.  None of the RD in the NMD clinic work with G or J tube feeding nutrition.  Info was taken & Mickel Baas will be reaching out to [place] via phone or staff message with response to pts need.  Thanks, Vilinda Blanks

## 2019-06-13 ENCOUNTER — Ambulatory Visit (INDEPENDENT_AMBULATORY_CARE_PROVIDER_SITE_OTHER): Payer: Medicare Other | Admitting: *Deleted

## 2019-06-13 ENCOUNTER — Other Ambulatory Visit: Payer: Self-pay

## 2019-06-13 DIAGNOSIS — I4892 Unspecified atrial flutter: Secondary | ICD-10-CM | POA: Diagnosis not present

## 2019-06-13 DIAGNOSIS — Z5181 Encounter for therapeutic drug level monitoring: Secondary | ICD-10-CM | POA: Diagnosis not present

## 2019-06-13 DIAGNOSIS — I4891 Unspecified atrial fibrillation: Secondary | ICD-10-CM

## 2019-06-13 LAB — POCT INR: INR: 2.6 (ref 2.0–3.0)

## 2019-06-13 NOTE — Patient Instructions (Signed)
Description   Continue on same dosage 2 tablets daily except 3 tablets on Sundays and Thursdays. Recheck in 3 weeks. Call Coumadin Clinic with any new medications [phone].

## 2019-06-16 ENCOUNTER — Other Ambulatory Visit: Payer: Self-pay | Admitting: *Deleted

## 2019-06-16 NOTE — Telephone Encounter (Signed)
Called and spoke to patient's daughter Shannon [name]. She states that the patient had been having trouble swallowing her meds and the majority of them have been switched to liquid form. She states that she cannot crush the protonix or the imdur and wanted to know if there were alternates.   Made her aware that her PCP called in for protonix packs. She states that this is $5000.   She states that the patient is able to swallow the imdur and protonix at this time without coughing or choking and that her PCP said it was okay to continue if she is able to do so. She was just checking in case something changed in her status. She will let us know if the patient is no longer able to swallow the imdur.

## 2019-06-16 NOTE — Patient Outreach (Signed)
[facility]) Care Management  06/16/2019  Shannon [name] 09/06/1923 [id number]   RN Health Coach attempted follow up outreach call to patient.  Patient was unavailable. HIPPA compliance voicemail message left with return callback number.  Plan: RN will call patient again within 30 days.  [facility] Management [phone]

## 2019-06-19 ENCOUNTER — Other Ambulatory Visit: Payer: Self-pay | Admitting: Nurse Practitioner

## 2019-06-25 ENCOUNTER — Other Ambulatory Visit: Payer: Self-pay | Admitting: Nurse Practitioner

## 2019-06-25 ENCOUNTER — Telehealth: Payer: Self-pay | Admitting: *Deleted

## 2019-06-25 MED ORDER — GENTAMICIN SULFATE 0.1 % EX CREA: 1.0000 "application " | TOPICAL_CREAM | Freq: Three times a day (TID) | CUTANEOUS | 1 refills | Status: DC | PRN

## 2019-06-25 NOTE — Telephone Encounter (Signed)
Pt's dtr, [name] states Dr. Amalia Hailey was to call in a refill of the antibiotic cream.

## 2019-06-25 NOTE — Telephone Encounter (Signed)
I informed pt's dtr, [name] the antibiotic cream had been sent to the [id number].

## 2019-06-28 ENCOUNTER — Other Ambulatory Visit: Payer: Self-pay | Admitting: Nurse Practitioner

## 2019-06-28 DIAGNOSIS — K922 Gastrointestinal hemorrhage, unspecified: Secondary | ICD-10-CM

## 2019-06-30 NOTE — Telephone Encounter (Signed)
Medication "Pantop[razole" is being requested but was discontinued by you on 06/11/2019. Please Advise.

## 2019-07-04 ENCOUNTER — Other Ambulatory Visit: Payer: Self-pay | Admitting: Nurse Practitioner

## 2019-07-04 ENCOUNTER — Other Ambulatory Visit: Payer: Self-pay

## 2019-07-04 ENCOUNTER — Ambulatory Visit (INDEPENDENT_AMBULATORY_CARE_PROVIDER_SITE_OTHER): Payer: Medicare Other | Admitting: Pharmacist

## 2019-07-04 DIAGNOSIS — I4892 Unspecified atrial flutter: Secondary | ICD-10-CM

## 2019-07-04 DIAGNOSIS — Z5181 Encounter for therapeutic drug level monitoring: Secondary | ICD-10-CM

## 2019-07-04 DIAGNOSIS — I4891 Unspecified atrial fibrillation: Secondary | ICD-10-CM

## 2019-07-04 DIAGNOSIS — K922 Gastrointestinal hemorrhage, unspecified: Secondary | ICD-10-CM

## 2019-07-04 LAB — POCT INR: INR: 2.6 (ref 2.0–3.0)

## 2019-07-04 NOTE — Patient Instructions (Signed)
Continue on same dosage 2 tablets daily except 3 tablets on Sundays and Thursdays. Recheck in 4 weeks. Call [facility] with any new medications [phone].

## 2019-07-06 DIAGNOSIS — I4891 Unspecified atrial fibrillation: Secondary | ICD-10-CM | POA: Diagnosis not present

## 2019-07-06 DIAGNOSIS — M542 Cervicalgia: Secondary | ICD-10-CM | POA: Diagnosis not present

## 2019-07-06 DIAGNOSIS — R404 Transient alteration of awareness: Secondary | ICD-10-CM | POA: Diagnosis not present

## 2019-07-06 DIAGNOSIS — R4182 Altered mental status, unspecified: Secondary | ICD-10-CM | POA: Diagnosis not present

## 2019-07-06 DIAGNOSIS — R52 Pain, unspecified: Secondary | ICD-10-CM | POA: Diagnosis not present

## 2019-07-07 ENCOUNTER — Observation Stay (HOSPITAL_COMMUNITY): Payer: Medicare Other

## 2019-07-07 ENCOUNTER — Inpatient Hospital Stay (HOSPITAL_COMMUNITY): Payer: Medicare Other

## 2019-07-07 ENCOUNTER — Emergency Department (HOSPITAL_COMMUNITY): Payer: Medicare Other

## 2019-07-07 ENCOUNTER — Inpatient Hospital Stay (HOSPITAL_COMMUNITY)
Admission: EM | Admit: 2019-07-07 | Discharge: 2019-07-08 | DRG: 070 | Disposition: A | Discharge status: Home / Self Care | Payer: Medicare Other | Attending: Internal Medicine | Admitting: Internal Medicine

## 2019-07-07 ENCOUNTER — Encounter (HOSPITAL_COMMUNITY): Payer: Self-pay

## 2019-07-07 ENCOUNTER — Other Ambulatory Visit: Payer: Self-pay

## 2019-07-07 ENCOUNTER — Inpatient Hospital Stay (HOSPITAL_COMMUNITY)
Admit: 2019-07-07 | Discharge: 2019-07-07 | Disposition: A | Discharge status: Home / Self Care | Payer: Medicare Other | Attending: Internal Medicine | Admitting: Internal Medicine

## 2019-07-07 DIAGNOSIS — Z66 Do not resuscitate: Secondary | ICD-10-CM | POA: Diagnosis present

## 2019-07-07 DIAGNOSIS — N183 Chronic kidney disease, stage 3 unspecified: Secondary | ICD-10-CM | POA: Diagnosis present

## 2019-07-07 DIAGNOSIS — I5032 Chronic diastolic (congestive) heart failure: Secondary | ICD-10-CM | POA: Diagnosis not present

## 2019-07-07 DIAGNOSIS — R109 Unspecified abdominal pain: Secondary | ICD-10-CM | POA: Diagnosis not present

## 2019-07-07 DIAGNOSIS — J690 Pneumonitis due to inhalation of food and vomit: Secondary | ICD-10-CM | POA: Diagnosis present

## 2019-07-07 DIAGNOSIS — I4821 Permanent atrial fibrillation: Secondary | ICD-10-CM | POA: Diagnosis present

## 2019-07-07 DIAGNOSIS — Z833 Family history of diabetes mellitus: Secondary | ICD-10-CM | POA: Diagnosis not present

## 2019-07-07 DIAGNOSIS — Z20822 Contact with and (suspected) exposure to covid-19: Secondary | ICD-10-CM | POA: Diagnosis present

## 2019-07-07 DIAGNOSIS — R404 Transient alteration of awareness: Secondary | ICD-10-CM | POA: Diagnosis not present

## 2019-07-07 DIAGNOSIS — K219 Gastro-esophageal reflux disease without esophagitis: Secondary | ICD-10-CM | POA: Diagnosis present

## 2019-07-07 DIAGNOSIS — I739 Peripheral vascular disease, unspecified: Secondary | ICD-10-CM | POA: Diagnosis present

## 2019-07-07 DIAGNOSIS — E1151 Type 2 diabetes mellitus with diabetic peripheral angiopathy without gangrene: Secondary | ICD-10-CM | POA: Diagnosis not present

## 2019-07-07 DIAGNOSIS — I959 Hypotension, unspecified: Secondary | ICD-10-CM | POA: Diagnosis present

## 2019-07-07 DIAGNOSIS — Z7901 Long term (current) use of anticoagulants: Secondary | ICD-10-CM | POA: Diagnosis not present

## 2019-07-07 DIAGNOSIS — Z79890 Hormone replacement therapy (postmenopausal): Secondary | ICD-10-CM | POA: Diagnosis not present

## 2019-07-07 DIAGNOSIS — Z7951 Long term (current) use of inhaled steroids: Secondary | ICD-10-CM | POA: Diagnosis not present

## 2019-07-07 DIAGNOSIS — Z794 Long term (current) use of insulin: Secondary | ICD-10-CM

## 2019-07-07 DIAGNOSIS — M19012 Primary osteoarthritis, left shoulder: Secondary | ICD-10-CM | POA: Diagnosis not present

## 2019-07-07 DIAGNOSIS — M25512 Pain in left shoulder: Secondary | ICD-10-CM | POA: Diagnosis not present

## 2019-07-07 DIAGNOSIS — K9423 Gastrostomy malfunction: Secondary | ICD-10-CM | POA: Diagnosis present

## 2019-07-07 DIAGNOSIS — Z931 Gastrostomy status: Secondary | ICD-10-CM

## 2019-07-07 DIAGNOSIS — I878 Other specified disorders of veins: Secondary | ICD-10-CM | POA: Diagnosis present

## 2019-07-07 DIAGNOSIS — Z9981 Dependence on supplemental oxygen: Secondary | ICD-10-CM

## 2019-07-07 DIAGNOSIS — D649 Anemia, unspecified: Secondary | ICD-10-CM | POA: Diagnosis present

## 2019-07-07 DIAGNOSIS — Z79899 Other long term (current) drug therapy: Secondary | ICD-10-CM | POA: Diagnosis not present

## 2019-07-07 DIAGNOSIS — R079 Chest pain, unspecified: Secondary | ICD-10-CM | POA: Diagnosis not present

## 2019-07-07 DIAGNOSIS — G9340 Encephalopathy, unspecified: Secondary | ICD-10-CM | POA: Diagnosis present

## 2019-07-07 DIAGNOSIS — Z23 Encounter for immunization: Secondary | ICD-10-CM | POA: Diagnosis not present

## 2019-07-07 DIAGNOSIS — N1832 Chronic kidney disease, stage 3b: Secondary | ICD-10-CM | POA: Diagnosis present

## 2019-07-07 DIAGNOSIS — Z86718 Personal history of other venous thrombosis and embolism: Secondary | ICD-10-CM

## 2019-07-07 DIAGNOSIS — I130 Hypertensive heart and chronic kidney disease with heart failure and stage 1 through stage 4 chronic kidney disease, or unspecified chronic kidney disease: Secondary | ICD-10-CM | POA: Diagnosis present

## 2019-07-07 DIAGNOSIS — Z89422 Acquired absence of other left toe(s): Secondary | ICD-10-CM

## 2019-07-07 DIAGNOSIS — R1084 Generalized abdominal pain: Secondary | ICD-10-CM | POA: Diagnosis not present

## 2019-07-07 DIAGNOSIS — J449 Chronic obstructive pulmonary disease, unspecified: Secondary | ICD-10-CM | POA: Diagnosis present

## 2019-07-07 DIAGNOSIS — R4182 Altered mental status, unspecified: Secondary | ICD-10-CM | POA: Diagnosis not present

## 2019-07-07 DIAGNOSIS — E1121 Type 2 diabetes mellitus with diabetic nephropathy: Secondary | ICD-10-CM | POA: Diagnosis not present

## 2019-07-07 DIAGNOSIS — L8990 Pressure ulcer of unspecified site, unspecified stage: Secondary | ICD-10-CM | POA: Diagnosis present

## 2019-07-07 DIAGNOSIS — E039 Hypothyroidism, unspecified: Secondary | ICD-10-CM | POA: Diagnosis present

## 2019-07-07 DIAGNOSIS — I4819 Other persistent atrial fibrillation: Secondary | ICD-10-CM | POA: Diagnosis present

## 2019-07-07 DIAGNOSIS — E785 Hyperlipidemia, unspecified: Secondary | ICD-10-CM | POA: Diagnosis not present

## 2019-07-07 DIAGNOSIS — Z89412 Acquired absence of left great toe: Secondary | ICD-10-CM | POA: Diagnosis not present

## 2019-07-07 DIAGNOSIS — M79602 Pain in left arm: Secondary | ICD-10-CM | POA: Diagnosis not present

## 2019-07-07 DIAGNOSIS — E1122 Type 2 diabetes mellitus with diabetic chronic kidney disease: Secondary | ICD-10-CM | POA: Diagnosis not present

## 2019-07-07 DIAGNOSIS — Z8673 Personal history of transient ischemic attack (TIA), and cerebral infarction without residual deficits: Secondary | ICD-10-CM | POA: Diagnosis not present

## 2019-07-07 DIAGNOSIS — I2510 Atherosclerotic heart disease of native coronary artery without angina pectoris: Secondary | ICD-10-CM | POA: Diagnosis not present

## 2019-07-07 DIAGNOSIS — R52 Pain, unspecified: Secondary | ICD-10-CM | POA: Diagnosis not present

## 2019-07-07 DIAGNOSIS — L89899 Pressure ulcer of other site, unspecified stage: Secondary | ICD-10-CM | POA: Diagnosis present

## 2019-07-07 DIAGNOSIS — I82542 Chronic embolism and thrombosis of left tibial vein: Secondary | ICD-10-CM | POA: Diagnosis present

## 2019-07-07 LAB — URINALYSIS, ROUTINE W REFLEX MICROSCOPIC
Bilirubin Urine: NEGATIVE
Glucose, UA: NEGATIVE mg/dL
Hgb urine dipstick: NEGATIVE
Ketones, ur: NEGATIVE mg/dL
Leukocytes,Ua: NEGATIVE
Nitrite: NEGATIVE
Protein, ur: NEGATIVE mg/dL
Specific Gravity, Urine: 1.011 (ref 1.005–1.030)
pH: 8 (ref 5.0–8.0)

## 2019-07-07 LAB — CBC WITH DIFFERENTIAL/PLATELET
Abs Immature Granulocytes: 0.01 10*3/uL (ref 0.00–0.07)
Basophils Absolute: 0 10*3/uL (ref 0.0–0.1)
Basophils Relative: 1 %
Eosinophils Absolute: 0.3 10*3/uL (ref 0.0–0.5)
Eosinophils Relative: 5 %
HCT: 29.9 % — ABNORMAL LOW (ref 36.0–46.0)
Hemoglobin: 10 g/dL — ABNORMAL LOW (ref 12.0–15.0)
Immature Granulocytes: 0 %
Lymphocytes Relative: 19 %
Lymphs Abs: 1.1 10*3/uL (ref 0.7–4.0)
MCH: 31.3 pg (ref 26.0–34.0)
MCHC: 33.4 g/dL (ref 30.0–36.0)
MCV: 93.7 fL (ref 80.0–100.0)
Monocytes Absolute: 0.7 10*3/uL (ref 0.1–1.0)
Monocytes Relative: 13 %
Neutro Abs: 3.4 10*3/uL (ref 1.7–7.7)
Neutrophils Relative %: 62 %
Platelets: 219 10*3/uL (ref 150–400)
RBC: 3.19 MIL/uL — ABNORMAL LOW (ref 3.87–5.11)
RDW: 16.4 % — ABNORMAL HIGH (ref 11.5–15.5)
WBC: 5.5 10*3/uL (ref 4.0–10.5)
nRBC: 0 % (ref 0.0–0.2)

## 2019-07-07 LAB — LIPASE, BLOOD: Lipase: 26 U/L (ref 11–51)

## 2019-07-07 LAB — TSH: TSH: 1.623 u[IU]/mL (ref 0.350–4.500)

## 2019-07-07 LAB — CBG MONITORING, ED: Glucose-Capillary: 108 mg/dL — ABNORMAL HIGH (ref 70–99)

## 2019-07-07 LAB — PROCALCITONIN: Procalcitonin: <0.1 ng/mL

## 2019-07-07 LAB — GLUCOSE, CAPILLARY
Glucose-Capillary: 114 mg/dL — ABNORMAL HIGH (ref 70–99)
Glucose-Capillary: 114 mg/dL — ABNORMAL HIGH (ref 70–99)
Glucose-Capillary: 140 mg/dL — ABNORMAL HIGH (ref 70–99)
Glucose-Capillary: 167 mg/dL — ABNORMAL HIGH (ref 70–99)
Glucose-Capillary: 122 mg/dL — ABNORMAL HIGH (ref 70–99)
Glucose-Capillary: 127 mg/dL — ABNORMAL HIGH (ref 70–99)

## 2019-07-07 LAB — LACTIC ACID, PLASMA: Lactic Acid, Venous: 1.1 mmol/L (ref 0.5–1.9)

## 2019-07-07 LAB — COMPREHENSIVE METABOLIC PANEL
ALT: 21 U/L (ref 0–44)
AST: 30 U/L (ref 15–41)
Albumin: 4 g/dL (ref 3.5–5.0)
Alkaline Phosphatase: 91 U/L (ref 38–126)
Anion gap: 10 (ref 5–15)
BUN: 64 mg/dL — ABNORMAL HIGH (ref 8–23)
CO2: 24 mmol/L (ref 22–32)
Calcium: 9.6 mg/dL (ref 8.9–10.3)
Chloride: 99 mmol/L (ref 98–111)
Creatinine, Ser: 1.69 mg/dL — ABNORMAL HIGH (ref 0.44–1.00)
GFR calc Af Amer: 30 mL/min — ABNORMAL LOW (ref >60)
GFR calc non Af Amer: 26 mL/min — ABNORMAL LOW (ref >60)
Glucose, Bld: 108 mg/dL — ABNORMAL HIGH (ref 70–99)
Potassium: 4.2 mmol/L (ref 3.5–5.1)
Sodium: 133 mmol/L — ABNORMAL LOW (ref 135–145)
Total Bilirubin: 0.5 mg/dL (ref 0.3–1.2)
Total Protein: 8.1 g/dL (ref 6.5–8.1)

## 2019-07-07 LAB — TROPONIN I (HIGH SENSITIVITY)
Troponin I (High Sensitivity): 15 ng/L (ref <18)
Troponin I (High Sensitivity): 21 ng/L — ABNORMAL HIGH (ref <18)

## 2019-07-07 LAB — PROTIME-INR
INR: 2.3 — ABNORMAL HIGH (ref 0.8–1.2)
Prothrombin Time: 25.4 seconds — ABNORMAL HIGH (ref 11.4–15.2)

## 2019-07-07 LAB — SARS CORONAVIRUS 2 (TAT 6-24 HRS): SARS Coronavirus 2: NEGATIVE

## 2019-07-07 LAB — BRAIN NATRIURETIC PEPTIDE: B Natriuretic Peptide: 242.4 pg/mL — ABNORMAL HIGH (ref 0.0–100.0)

## 2019-07-07 MED ORDER — ONDANSETRON HCL 4 MG/2ML IJ SOLN: 4.0000 mg | Freq: Four times a day (QID) | INTRAMUSCULAR | Status: DC | PRN

## 2019-07-07 MED ORDER — SODIUM CHLORIDE 0.9 % IV SOLN
1.0000 g | Freq: Once | INTRAVENOUS | Status: AC
  Administered 2019-07-07: 1 g via INTRAVENOUS
  Filled 2019-07-07: qty 10

## 2019-07-07 MED ORDER — LATANOPROST 0.005 % OP SOLN
1.0000 [drp] | Freq: Every day | OPHTHALMIC | Status: DC
  Administered 2019-07-07: 1 [drp] via OPHTHALMIC
  Filled 2019-07-07: qty 2.5

## 2019-07-07 MED ORDER — SODIUM CHLORIDE 0.9 % IV BOLUS (SEPSIS): 1000.0000 mL | Freq: Once | INTRAVENOUS | Status: DC

## 2019-07-07 MED ORDER — SERTRALINE HCL 50 MG PO TABS
50.0000 mg | ORAL_TABLET | Freq: Every day | ORAL | Status: DC
  Administered 2019-07-07: 50 mg
  Filled 2019-07-07: qty 1

## 2019-07-07 MED ORDER — MOMETASONE FURO-FORMOTEROL FUM 200-5 MCG/ACT IN AERO
2.0000 | INHALATION_SPRAY | Freq: Two times a day (BID) | RESPIRATORY_TRACT | Status: DC
  Administered 2019-07-07 – 2019-07-08 (×3): 2 via RESPIRATORY_TRACT
  Filled 2019-07-07: qty 8.8

## 2019-07-07 MED ORDER — ONDANSETRON HCL 4 MG PO TABS: 4.0000 mg | ORAL_TABLET | Freq: Four times a day (QID) | ORAL | Status: DC | PRN

## 2019-07-07 MED ORDER — SERTRALINE HCL 50 MG PO TABS
50.0000 mg | ORAL_TABLET | Freq: Every day | ORAL | Status: DC
  Administered 2019-07-08: 50 mg via ORAL
  Filled 2019-07-07: qty 1

## 2019-07-07 MED ORDER — AMLODIPINE BESYLATE 5 MG PO TABS: 5.0000 mg | ORAL_TABLET | Freq: Every day | ORAL | Status: DC

## 2019-07-07 MED ORDER — LEVALBUTEROL HCL 0.63 MG/3ML IN NEBU
0.6300 mg | INHALATION_SOLUTION | Freq: Four times a day (QID) | RESPIRATORY_TRACT | Status: DC | PRN
  Administered 2019-07-07: 0.63 mg via RESPIRATORY_TRACT
  Filled 2019-07-07: qty 3

## 2019-07-07 MED ORDER — WARFARIN SODIUM 5 MG PO TABS
5.0000 mg | ORAL_TABLET | Freq: Once | ORAL | Status: AC
  Administered 2019-07-07: 5 mg via ORAL
  Filled 2019-07-07: qty 1

## 2019-07-07 MED ORDER — OSMOLITE 1.2 CAL PO LIQD
711.0000 mL | ORAL | Status: AC
  Administered 2019-07-07: 711 mL
  Filled 2019-07-07: qty 711

## 2019-07-07 MED ORDER — HYDRALAZINE HCL 20 MG/ML IJ SOLN: 10.0000 mg | INTRAMUSCULAR | Status: DC | PRN

## 2019-07-07 MED ORDER — PRO-STAT SUGAR FREE PO LIQD
30.0000 mL | Freq: Two times a day (BID) | ORAL | Status: DC
  Administered 2019-07-07 – 2019-07-08 (×3): 30 mL via ORAL
  Filled 2019-07-07 (×3): qty 30

## 2019-07-07 MED ORDER — INSULIN ASPART 100 UNIT/ML ~~LOC~~ SOLN
0.0000 [IU] | SUBCUTANEOUS | Status: DC
  Administered 2019-07-08: 1 [IU] via SUBCUTANEOUS

## 2019-07-07 MED ORDER — HYDRALAZINE HCL 50 MG PO TABS
50.0000 mg | ORAL_TABLET | Freq: Three times a day (TID) | ORAL | Status: DC
  Administered 2019-07-07 – 2019-07-08 (×5): 50 mg
  Filled 2019-07-07 (×6): qty 1

## 2019-07-07 MED ORDER — FUROSEMIDE 8 MG/ML PO SOLN: 20.0000 mg | Freq: Every day | ORAL | Status: DC

## 2019-07-07 MED ORDER — OSMOLITE 1 CAL PO LIQD
711.0000 mL | ORAL | Status: DC
  Filled 2019-07-07: qty 711

## 2019-07-07 MED ORDER — ADULT MULTIVITAMIN LIQUID CH
15.0000 mL | Freq: Every day | ORAL | Status: DC
  Administered 2019-07-07 – 2019-07-08 (×2): 15 mL
  Filled 2019-07-07 (×2): qty 15

## 2019-07-07 MED ORDER — SODIUM CHLORIDE 0.9 % IV SOLN
100.0000 mg | Freq: Two times a day (BID) | INTRAVENOUS | Status: DC
  Administered 2019-07-07 – 2019-07-08 (×3): 100 mg via INTRAVENOUS
  Filled 2019-07-07 (×5): qty 100

## 2019-07-07 MED ORDER — OXYCODONE HCL 5 MG PO TABS: 5.0000 mg | ORAL_TABLET | ORAL | Status: DC | PRN

## 2019-07-07 MED ORDER — LEVOTHYROXINE SODIUM 125 MCG PO TABS
125.0000 ug | ORAL_TABLET | Freq: Every day | ORAL | Status: DC
  Administered 2019-07-08: 125 ug via ORAL
  Filled 2019-07-07: qty 1

## 2019-07-07 MED ORDER — ACETAMINOPHEN 325 MG PO TABS
650.0000 mg | ORAL_TABLET | Freq: Four times a day (QID) | ORAL | Status: DC | PRN
  Administered 2019-07-07: 650 mg via ORAL
  Filled 2019-07-07: qty 2

## 2019-07-07 MED ORDER — PANTOPRAZOLE SODIUM 40 MG PO PACK
40.0000 mg | PACK | Freq: Two times a day (BID) | ORAL | Status: DC
  Administered 2019-07-07 (×2): 40 mg
  Filled 2019-07-07 (×2): qty 20

## 2019-07-07 MED ORDER — LEVOTHYROXINE SODIUM 125 MCG PO TABS: 125.0000 ug | ORAL_TABLET | Freq: Every day | ORAL | Status: DC

## 2019-07-07 MED ORDER — FERROUS SULFATE 300 (60 FE) MG/5ML PO SYRP
300.0000 mg | ORAL_SOLUTION | Freq: Every day | ORAL | Status: DC
  Administered 2019-07-08: 300 mg via ORAL
  Filled 2019-07-07: qty 5

## 2019-07-07 MED ORDER — ADULT MULTIVITAMIN W/MINERALS CH: 1.0000 | ORAL_TABLET | Freq: Every day | ORAL | Status: DC

## 2019-07-07 MED ORDER — ISOSORBIDE MONONITRATE ER 30 MG PO TB24
30.0000 mg | ORAL_TABLET | Freq: Every day | ORAL | Status: DC
  Administered 2019-07-07 – 2019-07-08 (×2): 30 mg via ORAL
  Filled 2019-07-07 (×2): qty 1

## 2019-07-07 MED ORDER — PANTOPRAZOLE SODIUM 40 MG PO PACK
40.0000 mg | PACK | Freq: Two times a day (BID) | ORAL | Status: DC
  Administered 2019-07-08: 40 mg via ORAL
  Filled 2019-07-07 (×2): qty 20

## 2019-07-07 MED ORDER — ATORVASTATIN CALCIUM 10 MG PO TABS
10.0000 mg | ORAL_TABLET | Freq: Every day | ORAL | Status: DC
  Administered 2019-07-07: 10 mg
  Filled 2019-07-07: qty 1

## 2019-07-07 MED ORDER — IPRATROPIUM-ALBUTEROL 0.5-2.5 (3) MG/3ML IN SOLN: 3.0000 mL | Freq: Four times a day (QID) | RESPIRATORY_TRACT | Status: DC | PRN

## 2019-07-07 MED ORDER — FERROUS SULFATE 300 (60 FE) MG/5ML PO SYRP
300.0000 mg | ORAL_SOLUTION | Freq: Every day | ORAL | Status: DC
  Administered 2019-07-07: 300 mg
  Filled 2019-07-07: qty 5

## 2019-07-07 MED ORDER — SODIUM CHLORIDE 0.9 % IV SOLN
500.0000 mg | Freq: Once | INTRAVENOUS | Status: AC
  Administered 2019-07-07: 500 mg via INTRAVENOUS
  Filled 2019-07-07: qty 500

## 2019-07-07 MED ORDER — FENTANYL CITRATE (PF) 100 MCG/2ML IJ SOLN
50.0000 ug | Freq: Once | INTRAMUSCULAR | Status: AC
  Administered 2019-07-07: 50 ug via INTRAVENOUS
  Filled 2019-07-07: qty 2

## 2019-07-07 MED ORDER — SODIUM CHLORIDE (PF) 0.9 % IJ SOLN
INTRAMUSCULAR | Status: AC
  Filled 2019-07-07: qty 50

## 2019-07-07 MED ORDER — SODIUM CHLORIDE 0.9 % IV BOLUS (SEPSIS): 500.0000 mL | Freq: Once | INTRAVENOUS | Status: DC

## 2019-07-07 MED ORDER — FENTANYL CITRATE (PF) 100 MCG/2ML IJ SOLN
25.0000 ug | Freq: Once | INTRAMUSCULAR | Status: AC
  Administered 2019-07-07: 25 ug via INTRAVENOUS
  Filled 2019-07-07: qty 2

## 2019-07-07 MED ORDER — ONDANSETRON HCL 4 MG/2ML IJ SOLN
4.0000 mg | Freq: Once | INTRAMUSCULAR | Status: AC
  Administered 2019-07-07: 4 mg via INTRAVENOUS
  Filled 2019-07-07: qty 2

## 2019-07-07 MED ORDER — WARFARIN - PHARMACIST DOSING INPATIENT: Freq: Every day | Status: DC

## 2019-07-07 MED ORDER — ENOXAPARIN SODIUM 40 MG/0.4ML ~~LOC~~ SOLN: 40.0000 mg | SUBCUTANEOUS | Status: DC

## 2019-07-07 MED ORDER — GABAPENTIN 250 MG/5ML PO SOLN
300.0000 mg | Freq: Two times a day (BID) | ORAL | Status: DC
  Administered 2019-07-08: 300 mg via ORAL
  Filled 2019-07-07 (×3): qty 6

## 2019-07-07 MED ORDER — ATORVASTATIN CALCIUM 10 MG PO TABS
10.0000 mg | ORAL_TABLET | Freq: Every day | ORAL | Status: DC
  Administered 2019-07-08: 10 mg via ORAL
  Filled 2019-07-07: qty 1

## 2019-07-07 MED ORDER — GABAPENTIN 250 MG/5ML PO SOLN
300.0000 mg | Freq: Two times a day (BID) | ORAL | Status: DC
  Administered 2019-07-07 (×2): 300 mg
  Filled 2019-07-07 (×2): qty 6

## 2019-07-07 MED ORDER — SODIUM CHLORIDE 0.9 % IV BOLUS (SEPSIS)
1000.0000 mL | Freq: Once | INTRAVENOUS | Status: AC
  Administered 2019-07-07: 1000 mL via INTRAVENOUS

## 2019-07-07 MED ORDER — FREE WATER
180.0000 mL | Freq: Three times a day (TID) | Status: DC
  Administered 2019-07-07 – 2019-07-08 (×5): 180 mL

## 2019-07-07 MED ORDER — ACETAMINOPHEN 650 MG RE SUPP: 650.0000 mg | Freq: Four times a day (QID) | RECTAL | Status: DC | PRN

## 2019-07-07 MED ORDER — IOHEXOL 350 MG/ML SOLN
80.0000 mL | Freq: Once | INTRAVENOUS | Status: AC | PRN
  Administered 2019-07-07: 80 mL via INTRAVENOUS

## 2019-07-07 MED ORDER — FLUTICASONE PROPIONATE 50 MCG/ACT NA SUSP
2.0000 | Freq: Every day | NASAL | Status: DC
  Administered 2019-07-07 – 2019-07-08 (×2): 2 via NASAL
  Filled 2019-07-07: qty 16

## 2019-07-07 NOTE — Progress Notes (Signed)
Paged provider to inform him of pt troponin level  21

## 2019-07-07 NOTE — ED Provider Notes (Signed)
TIME SEEN: 12:59 AM  CHIEF COMPLAINT: AMS  HPI:   Patient is a 86 year old female with multiple medical problems listed below who presents to the emergency department with an episode of altered mental status.  Patient now oriented to person, place and time.  She is complaining of lower chest pain, diffuse abdominal pain, left arm pain.  She is unable to tell me what happened tonight.  She states she lives at home with her daughter.  Spoke with daughter by phone.  Reports patient was in bed and heard patient "making these sounds like somebody was playing".  She states the sounds were like "bip" "bop" "meow".  She states she walked into the room and then the patient was noted to be very diaphoretic and moving around a lot.  She states that the patient had her eyes closed during this episode and when the daughter was talking to the patient and asking her if she was okay, the patient would not respond.  This lasted 3 to 5 minutes.  Daughter states this did not look like a seizure to her.  Daughter reports that she has a daughter herself that has seizures and this was not similar.  There is no posturing or generalized tonic-clonic movements.  Patient came to and could talk and was not confused.  No postictal state.  No tongue biting or incontinence.  No h/o seizures.  Daughter reports patient has never had an episode like this before.    No fever, cough, vomiting, diarrhea recently.  No medication changes.  No h/o ETOH use.  No known falls, head injury.  Daughter reports she told EMS that her vagina was hurting tonight.  No concern for abuse per daughter.  Never had anything like this before.  Went to bed in normal state of health.  Uses a walker at home.  Daughter is POA.  Reports patient would not want chest compressions, intubation, use of life support machines including a ventilator.    ROS: Level 5 caveat secondary to altered mental status  PAST MEDICAL HISTORY/PAST SURGICAL HISTORY:  Past  Medical History:  Diagnosis Date  . Acute respiratory failure ([place])   . Anemia    previous blood transfusions  . Arthritis    "all over"  . Asthma   . Atrial flutter ([place])   . Bradycardia    requiring previous d/c of BB and reduction of amiodarone  . CAD (coronary artery disease)    nonobstructive per notes  . Chronic diastolic CHF (congestive heart failure) ([place])   . CKD (chronic kidney disease), stage III   . Complication of blood transfusion    "got the wrong blood type at [facility] in ~ 2015; no adverse reaction that we are aware of"/daughter, [name] (01/27/2016)  . COPD (chronic obstructive pulmonary disease) ([place])   . Depression    "light case"  . DVT (deep venous thrombosis) ([place]) 01/2016   a. LLE DVT 01/2016 - switched from Eliquis to Coumadin.  [name] Dyspnea    with some activity  . Gastric stenosis    a. s/p stomach tube  . GERD (gastroesophageal reflux disease)   . History of blood transfusion    "several" (01/27/2016)  . History of stomach ulcers   . Hyperlipidemia   . Hypertension   . Hypothyroidism   . On home oxygen therapy    "2L; 9PM til 9AM" (06/27/2017)  . PAD (peripheral artery disease) ([place])    a. prior gangrene, toe amputations, intervention.  [name] PAF (paroxysmal  atrial fibrillation) ([place])   . Paraesophageal hernia   . Perforated gastric ulcer ([place])   . Pneumonia    "a few times" (06/27/2017)  . Seasonal allergies   . SIADH (syndrome of inappropriate ADH production) ([place])    [name] 01/10/2015  . Small bowel obstruction ([place])    "I don't know how many" (01/11/2015)  . Stroke [facility])    several- no residual  . Type II diabetes mellitus ([place])    "related to prednisone use  for > 20 yr; once predinose stopped; no more DM RX" (01/27/2016)  . UTI (urinary tract infection) 02/08/2016  . Ventral hernia with bowel obstruction     MEDICATIONS:  Prior to Admission medications   Medication Sig Start Date End Date Taking? Authorizing Provider  acetaminophen  (TYLENOL) 500 MG tablet Take 500 mg by mouth every 6 (six) hours as needed (pain).     [provider]  Amino Acids-Protein Hydrolys (FEEDING SUPPLEMENT, PRO-STAT SUGAR FREE 64,) LIQD Take 30 mLs by mouth 2 (two) times daily.    [provider]  amLODipine (NORVASC) 5 MG tablet Take 1 tablet (5 mg total) by mouth daily. 11/26/18 11/26/19  Marcell Anger, MD  atorvastatin (LIPITOR) 10 MG tablet TAKE 1 TABLET BY MOUTH EVERY DAY 06/25/19   [name], NP  b complex vitamins tablet Take 1 tablet by mouth daily.     [provider]  calcium-vitamin D (OSCAL WITH D) 500-200 MG-UNIT per tablet Take 1 tablet by mouth daily with breakfast.     [provider]  Carboxymethylcellulose Sodium (ARTIFICIAL TEARS OP) Place 1 drop into both eyes 4 (four) times daily.    [provider]  cycloSPORINE (RESTASIS) 0.05 % ophthalmic emulsion USE 1 DROP INTO BOTH EYES TWICE DAILY 01/29/17   Gildardo Cranker, DO  DULERA 200-5 MCG/ACT AERO TAKE TWO PUFFS BY MOUTH TWICE DAILY 01/13/19   [name], NP  feeding supplement (OSMOLITE 1 CAL) LIQD Take 711 mLs by mouth See admin instructions. For 12 hours 2100 - 0900    [provider]  ferrous sulfate 220 (44 Fe) MG/5ML solution Take 7.4 mLs (325 mg total) by mouth daily. 06/10/19   [name], NP  fluticasone (FLONASE) 50 MCG/ACT nasal spray SPRAY 2 SPRAYS INTO EACH NOSTRIL EVERY DAY 06/25/19   [name], NP  furosemide (LASIX) 8 MG/ML solution 20 mg (2.5 ml) daily unless swelling occurs take 40 mg (5 ml) and then switch back to 20 mg daily 06/10/19   [name], NP  gabapentin (NEURONTIN) 250 MG/5ML solution Take 6 mLs (300 mg total) by mouth 2 (two) times daily. 06/10/19   [name], NP  gentamicin cream (GARAMYCIN) 0.1 % Apply 1 application topically 3 (three) times daily as needed (dry skin). 06/25/19   Edrick Kins, DPM  hydrALAZINE (APRESOLINE) 50 MG tablet TAKE 1 TABLET  BY MOUTH THREE TIMES A DAY 02/06/19   Dorothy Spark, MD  insulin aspart (NOVOLOG) 100 UNIT/ML FlexPen Inject 0-6 Units into the skin 3 (three) times daily with meals. New sliding Scale If BS is 151 - 251 = 2 units,  If BS is 251 -  350 = 4 units,  If BS is 351 - 450 = 6 units Call provider if BS runs more than 400 02/03/19   [name], NP  Insulin Glargine (LANTUS SOLOSTAR) 100 UNIT/ML Solostar Pen Inject 8 Units into the skin at bedtime. 02/20/19   Sherrie Mustache  K, NP  Insulin Pen Needle (B-D UF III MINI PEN NEEDLES) 31G X 5 MM MISC Use twice daily with giving insulin injections. Dx E11.9 07/17/18   [name], NP  ipratropium-albuterol (DUONEB) 0.5-2.5 (3) MG/3ML SOLN Take 3 mLs by nebulization every 6 (six) hours as needed (sob). Use 3 times daily x 4 days then every 6 hours as needed. 03/21/16   Eileen Stanford, PA-C  isosorbide mononitrate (IMDUR) 30 MG 24 hr tablet TAKE 2 TABLETS BY MOUTH DAILY. PLEASE MAKE ANNUAL APPT WITH DR. Meda Coffee FOR FUTURE REFILLS. THANK YOU. 1ST ATTEMPT. 12/27/18   Dorothy Spark, MD  latanoprost (XALATAN) 0.005 % ophthalmic solution Place 1 drop into both eyes at bedtime. 06/02/14   Blanchie Serve, MD  levalbuterol [facility] HFA) 45 MCG/ACT inhaler Inhale 2 puffs into the lungs every 6 (six) hours as needed for wheezing or shortness of breath. 02/03/16   Gildardo Cranker, DO  levothyroxine (SYNTHROID) 125 MCG tablet TAKE 1 TABLET BY MOUTH EVERY DAY 03/18/19   [name], NP  loratadine (CLARITIN) 10 MG tablet Take 10 mg by mouth daily as needed for allergies.    [provider]  Multiple Vitamin (MULTIVITAMIN WITH MINERALS) TABS tablet Take 1 tablet by mouth daily.    [provider]  nystatin (MYCOSTATIN/NYSTOP) powder Apply 1 g topically as needed (skin irritation).     [provider]  oxyCODONE (ROXICODONE) 5 MG immediate release tablet Take 1 tablet (5 mg total) by mouth every 4 (four) hours as needed for severe  pain. May take 1/2 tab q4hrs as needed for mild pain. 06/14/18   [name], NP  pantoprazole sodium (PROTONIX) 40 mg/20 mL PACK Place 20 mLs (40 mg total) into feeding tube 2 (two) times daily. 06/11/19   [name], NP  Probiotic Product (EQL PROBIOTIC COLON SUPPORT PO) Take 1 capsule by mouth daily.    [provider]  sertraline (ZOLOFT) 20 MG/ML concentrated solution Take 2.5 mLs (50 mg total) by mouth daily. 06/10/19   [name], NP  warfarin (COUMADIN) 2.5 MG tablet Take 2-3 tablets by mouth daily as directed by Coumadin clinic. 03/24/19   Dorothy Spark, MD  Water For Irrigation, Sterile (FREE WATER) SOLN Place 180 mLs into feeding tube 3 (three) times daily.     [provider]  White Petrolatum-Mineral Oil (SYSTANE NIGHTTIME) OINT Place 1 application into both eyes at bedtime.     [provider]    ALLERGIES:  Allergies  Allergen Reactions  . Penicillins Itching, Rash and Other (See Comments)    Tolerated amoxicillin, unasyn, zosyn & cephalosporins in the past. Did it involve swelling of the face/tongue/throat, SOB, or low BP? No Did it involve sudden or severe rash/hives, skin peeling, or any reaction on the inside of your mouth or nose? No Did you need to seek medical attention at a hospital or doctor's office? Unknown When did it last happen?5+ years If all above answers are "NO", may proceed with cephalosporin use.     SOCIAL HISTORY:  Social History   Tobacco Use  . Smoking status: Never Smoker  . Smokeless tobacco: Former Systems developer    Types: Snuff  . Tobacco comment: "used snuff in my younger days"  Substance Use Topics  . Alcohol use: Never    Alcohol/week: 0.0 standard drinks    FAMILY HISTORY: Family History  Problem Relation Age of Onset  . Stroke Mother   . Hypertension Mother   .  Diabetes Brother   . Glaucoma Son   . Glaucoma Son   . Heart attack Neg Hx   . Colon cancer Neg Hx   . Stomach cancer  Neg Hx     EXAM: BP 136/76 (BP Location: Left Arm)   Pulse 70   Temp 98.8 F (37.1 C) (Oral)   Resp (!) 24   SpO2 100%  CONSTITUTIONAL: Alert and oriented to person, year and place and responds appropriately to questions intermittently but appears confused versus being a poor historian.  Appears in pain.  Elderly.  Frail.  Patient is a poor historian. HEAD: Normocephalic, appears atraumatic EYES: Conjunctivae clear, pupils appear equal, EOM appear intact ENT: normal nose; moist mucous membranes NECK: Supple, normal ROM, no midline spinal tenderness or step-off or deformity CARD: Irregularly irregular; S1 and S2 appreciated; no murmurs, no clicks, no rubs, no gallops RESP: Normal chest excursion without splinting or tachypnea; breath sounds clear and equal bilaterally; no wheezes, no rhonchi, no rales, no hypoxia or respiratory distress, speaking full sentences ABD/GI: Normal bowel sounds; non-distended; soft, tender throughout the abdomen without guarding or rebound, scars noted from previous abdominal surgery, G-tube in place BACK:  The back appears normal, no midline spinal tenderness or step-off or deformity EXT: Pain in the left shoulder and humerus without obvious deformity.  She has pain lifting this arm past 45 degrees.  Her extremities are warm and well-perfused.  There is no edema.  Compartments are soft. SKIN: Normal color for age and race; warm; no rash on exposed skin NEURO: Moves all extremities equally, able to lift all extremities off the bed but has pain in the left arm, no facial asymmetry, speech is very difficult to understand but does not appear to be dysarthric and no aphasia, difficult to test sensation   MEDICAL DECISION MAKING: Patient here with complaints of left arm pain, chest pain, abdominal pain.  Initially told EMS that she was having vaginal bleeding but denies this currently.  Daughter reports there was an episode of making abnormal sounds and then abnormal  movements in all extremities.  There is no post ictal state, incontinence, tongue biting.  No previous history of seizures.  Patient has had intermittent hypotension here.  Nurse reports blood pressure is currently 88/60.  Will check rectal temperature.  Will check labs including CBC, CMP, troponin, TSH, lactic.  Will obtain urinalysis.  She appears quite uncomfortable here complaining of chest and abdominal pain.  Differential is large and includes ACS, CHF, pneumonia, dissection, PE (she has had a DVT before but is on Coumadin), colitis, diverticulitis, perforation, mesenteric ischemia, appendicitis, cholecystitis, pancreatitis.  Patient unable to tell me what abdominal surgery she has had in the past.  She has a G-tube.  Obtain CT of the head given altered mental status and possible seizure activity tonight although sounds atypical and daughter states this did not look like seizure she has seen before and her own daughter.  No obvious focal neurologic deficits on exam currently but exam is limited.  Lengthy discussion had with patient's daughter by phone.  She reports patient is a "partial code" but tells me that the patient would not want chest compressions, intubation, be on a ventilator or other life support machine.  She does agree with full work-up today for further evaluation.  We will give IV fluids for hypotension while trying to differentiate what is causing her low blood pressure.  Will give fentanyl and Zofran for symptomatic relief.  ED PROGRESS: Patient's blood glucose  is normal.  EKG shows atrial fibrillation without ischemia.  She has history of atrial flutter.  Blood pressure improving and is 116/72.  IV fluids running currently.  Rectal temperature normal.   I have reviewed and interpreted patient's labs.  She has anemia which is chronic and stable.  No leukocytosis.  Chronic kidney disease which is also stable.  No electrolyte derangement.  Normal TSH.  Negative troponin.  Normal  lactate.  INR therapeutic.  LFTs, lipase normal.  I have independently reviewed and interpreted patient's imaging.  X-rays of the left shoulder and humerus that showed degenerative changes but no acute abnormality.  CT of the head shows no acute process.  There are age-related changes present and possible right maxillary sinusitis.  CT of the chest, abdomen pelvis show no dissection or aneurysm.  She does appear to have mild bronchial thickening and mucous plugging at the left lung base which could be due to infection versus aspiration.  Daughter denies any recent episodes of aspiration.  No hypoxia here but could be contributing to her chest pain and hypotension.  Blood pressure continues to be stable.  Will give Rocephin and azithromycin and swab for COVID-19.  Abdomen pelvis shows ventral wall hernia without obstruction.  No other acute abnormality noted.  Discussed these findings with patient and daughter.  Patient does seem more alert here but still having pain.  Will give second dose of fentanyl.  Now complaining mostly of left hip pain.  No leg length discrepancy.  No redness, warmth or soft tissue swelling noted.  No fracture or dislocation seen on CT of abdomen pelvis.  Discussed with patient and daughter that I feel patient should be admitted to the hospital for observation given this episode of abnormal movements, altered mental status and then hypotension here in the emergency department with possible left lower lobe pneumonia.  Patient and daughter are comfortable with this plan.  Will discuss with hospitalist.   3:15 AM Discussed patient's case with hospitalist, Dr. Hal Hope.  I have recommended admission and patient (and family if present) agree with this plan. Admitting physician will place admission orders.   I reviewed all nursing notes, vitals, pertinent previous records and interpreted all EKGs, lab and urine results, imaging (as available).   Urine shows no sign of infection.  No  ketones to suggest dehydration.  EKG Interpretation  Date/Time:  Monday July 07 2019 01:17:16 EDT Ventricular Rate:  81 PR Interval:    QRS Duration: 79 QT Interval:  375 QTC Calculation: 436 R Axis:   62 Text Interpretation: Atrial fibrillation Probable anteroseptal infarct, old Minimal ST depression, inferior leads Confirmed by , Cyril Mourning [phone]) on 07/07/2019 1:26:01 AM        CRITICAL CARE Performed by: Cyril Mourning    Total critical care time: 65 minutes  Critical care time was exclusive of separately billable procedures and treating other patients.  Critical care was necessary to treat or prevent imminent or life-threatening deterioration.  Critical care was time spent personally by me on the following activities: development of treatment plan with patient and/or surrogate as well as nursing, discussions with consultants, evaluation of patient's response to treatment, examination of patient, obtaining history from patient or surrogate, ordering and performing treatments and interventions, ordering and review of laboratory studies, ordering and review of radiographic studies, pulse oximetry and re-evaluation of patient's condition.   Shannon [name] was evaluated in Emergency Department on 07/07/2019 for the symptoms described in the history of present illness. She was evaluated  in the context of the global COVID-19 pandemic, which necessitated consideration that the patient might be at risk for infection with the SARS-CoV-2 virus that causes COVID-19. Institutional protocols and algorithms that pertain to the evaluation of patients at risk for COVID-19 are in a state of rapid change based on information released by regulatory bodies including the CDC and federal and state organizations. These policies and algorithms were followed during the patient's care in the ED.      , Delice Bison, DO 07/07/19 [phone]

## 2019-07-07 NOTE — Evaluation (Signed)
SLP Cancellation Note  Patient Details Name: Shannon [name] MRN: [id number] DOB: [birth year]   Cancelled treatment:       Reason Eval/Treat Not Completed: Other (comment)(pt is at MRI at this time, will continue efforts)   Macario Golds 07/07/2019, 2:32 PM   Kathleen Lime, MS [place] Office [phone]

## 2019-07-07 NOTE — Progress Notes (Signed)
EEG complete - results pending 

## 2019-07-07 NOTE — Progress Notes (Signed)
Pt arrives to floor on stretcher with ED RN, vss, no complaints of pain, alert, will continue to monitor

## 2019-07-07 NOTE — H&P (Signed)
History and Physical    Shannon [name] WJX:[id number] DOB: [birth year] DOA: 07/07/2019  PCP: Sharon Seller, NP  Patient coming from: Home.  History obtained from patient's daughter.  Chief Complaint: Confusion.  HPI: Shannon [name] is a 86 y.o. female with history of atrial fibrillation, diastolic CHF, chronic kidney disease stage III, nonobstructive CAD, diabetes mellitus, peripheral vascular disease status post amputation of left great toe with chronic ischemic-looking foot and multiple wounds and history of J-tube placement for pyloric stenosis requiring multiple surgeries on PEG tube feeds presents to the ER after patient was found to be briefly confused at home.  Per patient's daughter patient was making some incoherent height in her room at around 11 PM last night and when patient's daughter went to check on her she looked confused and was brought to the ER.  Her confusion lasted for almost half hour.  Patient also had generalized shaking spells which patient's daughter feels that is not seizures.  Did not have any incontinence of urine or bowel.  Patient was admitted in February 2021 for syncopal episode.  ED Course: In the ER patient became more alert awake.  Blood pressure initially was in the low 80s.  Patient in the ER started complaining of upper back pain chest pain.  Given the symptoms patient had CT angiogram of the chest abdomen pelvis to rule out dissection which showed chronic changes with possible infiltrates in the left lower lobe.  For which patient was kept on antibiotics.  CT head was unremarkable.  EKG shows A. fib rate controlled with nonspecific changes.  Labs show creatinine 1.6 hemoglobin 10.9 high-sensitivity troponin was 15 lactic acid 1.1 BNP 242.  Covid test is pending.  Review of Systems: As per HPI, rest all negative.   Past Medical History:  Diagnosis Date  . Acute respiratory failure (HCC)   . Anemia    previous blood transfusions  . Arthritis    "all  over"  . Asthma   . Atrial flutter (HCC)   . Bradycardia    requiring previous d/c of BB and reduction of amiodarone  . CAD (coronary artery disease)    nonobstructive per notes  . Chronic diastolic CHF (congestive heart failure) (HCC)   . CKD (chronic kidney disease), stage III   . Complication of blood transfusion    "got the wrong blood type at [facility] in ~ 2015; no adverse reaction that we are aware of"/daughter, [name] (01/27/2016)  . COPD (chronic obstructive pulmonary disease) (HCC)   . Depression    "light case"  . DVT (deep venous thrombosis) (HCC) 01/2016   a. LLE DVT 01/2016 - switched from Eliquis to Coumadin.  [name] Dyspnea    with some activity  . Gastric stenosis    a. s/p stomach tube  . GERD (gastroesophageal reflux disease)   . History of blood transfusion    "several" (01/27/2016)  . History of stomach ulcers   . Hyperlipidemia   . Hypertension   . Hypothyroidism   . On home oxygen therapy    "2L; 9PM til 9AM" (06/27/2017)  . PAD (peripheral artery disease) (HCC)    a. prior gangrene, toe amputations, intervention.  [name] PAF (paroxysmal atrial fibrillation) (HCC)   . Paraesophageal hernia   . Perforated gastric ulcer (HCC)   . Pneumonia    "a few times" (06/27/2017)  . Seasonal allergies   . SIADH (syndrome of inappropriate ADH production) (HCC)    [name] 01/10/2015  . Small bowel  obstruction (HCC)    "I don't know how many" (01/11/2015)  . Stroke [facility])    several- no residual  . Type II diabetes mellitus (HCC)    "related to prednisone use  for > 20 yr; once predinose stopped; no more DM RX" (01/27/2016)  . UTI (urinary tract infection) 02/08/2016  . Ventral hernia with bowel obstruction     Past Surgical History:  Procedure Laterality Date  . ABDOMINAL AORTOGRAM N/A 09/21/2016   Procedure: Abdominal Aortogram;  Surgeon: Maeola Harman, MD;  Location: [facility] INVASIVE CV LAB;  Service: Cardiovascular;  Laterality: N/A;  . AMPUTATION Left 09/25/2016     Procedure: AMPUTATION DIGIT- LEFT 2ND AND 3RD TOES;  Surgeon: Larina Earthly, MD;  Location: [facility] OR;  Service: Vascular;  Laterality: Left;  . AMPUTATION Right 05/03/2018   Procedure: AMPUTATION RIGHT GREAT TOE;  Surgeon: Larina Earthly, MD;  Location: [facility] OR;  Service: Vascular;  Laterality: Right;  . CATARACT EXTRACTION W/ INTRAOCULAR LENS  IMPLANT, BILATERAL    . CHOLECYSTECTOMY OPEN    . COLECTOMY    . ESOPHAGOGASTRODUODENOSCOPY N/A 01/19/2014   Procedure: ESOPHAGOGASTRODUODENOSCOPY (EGD);  Surgeon: Hilarie Fredrickson, MD;  Location: [facility] ENDOSCOPY;  Service: Endoscopy;  Laterality: N/A;  . ESOPHAGOGASTRODUODENOSCOPY N/A 01/20/2014   Procedure: ESOPHAGOGASTRODUODENOSCOPY (EGD);  Surgeon: Hilarie Fredrickson, MD;  Location: [facility] ENDOSCOPY;  Service: Endoscopy;  Laterality: N/A;  . ESOPHAGOGASTRODUODENOSCOPY N/A 03/19/2014   Procedure: ESOPHAGOGASTRODUODENOSCOPY (EGD);  Surgeon: Rachael Fee, MD;  Location: [facility] ENDOSCOPY;  Service: Endoscopy;  Laterality: N/A;  . ESOPHAGOGASTRODUODENOSCOPY N/A 07/08/2015   Procedure: ESOPHAGOGASTRODUODENOSCOPY (EGD);  Surgeon: Sherrilyn Rist, MD;  Location: [facility] ENDOSCOPY;  Service: Endoscopy;  Laterality: N/A;  . ESOPHAGOGASTRODUODENOSCOPY (EGD) WITH PROPOFOL N/A 09/15/2015   Procedure: ESOPHAGOGASTRODUODENOSCOPY (EGD) WITH PROPOFOL;  Surgeon: Ruffin Frederick, MD;  Location: [facility] ENDOSCOPY;  Service: Gastroenterology;  Laterality: N/A;  . GASTROJEJUNOSTOMY     hx/notes 01/10/2015  . GASTROJEJUNOSTOMY N/A 09/23/2015   Procedure: OPEN GASTROJEJUNOSTOMY TUBE PLACEMENT;  Surgeon: De Blanch Kinsinger, MD;  Location: [facility] ORS;  Service: General;  Laterality: N/A;  . GLAUCOMA SURGERY Bilateral   . HERNIA REPAIR  2015  . IR CM INJ ANY COLONIC TUBE W/FLUORO  01/05/2017  . IR CM INJ ANY COLONIC TUBE W/FLUORO  06/07/2017  . IR CM INJ ANY COLONIC TUBE W/FLUORO  11/05/2017  . IR CM INJ ANY COLONIC TUBE W/FLUORO  09/18/2018  . IR CM INJ ANY COLONIC TUBE W/FLUORO  03/06/2019  . IR CM INJ ANY  COLONIC TUBE W/FLUORO  03/11/2019  . IR GENERIC HISTORICAL  01/07/2016   IR GJ TUBE CHANGE 01/07/2016 Malachy Moan, MD [facility]  . IR GENERIC HISTORICAL  01/27/2016   IR MECH REMOV OBSTRUC MAT ANY COLON TUBE W/FLUORO 01/27/2016 Richarda Overlie, MD [facility]  . IR GENERIC HISTORICAL  02/07/2016   IR PATIENT EVAL TECH 0-60 MINS Darrell K Allred, PA-C [facility]  . IR GENERIC HISTORICAL  02/08/2016   IR GJ TUBE CHANGE 02/08/2016 Berdine Dance, MD [facility]  . IR GENERIC HISTORICAL  01/06/2016   IR GJ TUBE CHANGE 01/06/2016 [facility] OUT REF  . IR GENERIC HISTORICAL  05/02/2016   IR CM INJ ANY COLONIC TUBE W/FLUORO 05/02/2016 Oley Balm, MD [facility]  . IR GENERIC HISTORICAL  05/15/2016   IR GJ TUBE CHANGE 05/15/2016 Simonne Come, MD [facility]  . IR GENERIC HISTORICAL  06/28/2016   IR GJ TUBE CHANGE 06/28/2016 [facility]  . IR GJ TUBE  CHANGE  02/20/2017  . IR GJ TUBE CHANGE  05/10/2017  . IR GJ TUBE CHANGE  07/06/2017  . IR GJ TUBE CHANGE  08/02/2017  . IR GJ TUBE CHANGE  10/15/2017  . IR GJ TUBE CHANGE  12/26/2017  . IR GJ TUBE CHANGE  04/08/2018  . IR GJ TUBE CHANGE  06/19/2018  . IR GJ TUBE CHANGE  03/03/2019  . IR GJ TUBE CHANGE  06/06/2019  . IR PATIENT EVAL TECH 0-60 MINS  10/19/2016  . IR PATIENT EVAL TECH 0-60 MINS  12/25/2016  . IR PATIENT EVAL TECH 0-60 MINS  01/29/2017  . IR PATIENT EVAL TECH 0-60 MINS  04/04/2017  . IR PATIENT EVAL TECH 0-60 MINS  04/30/2017  . IR PATIENT EVAL TECH 0-60 MINS  07/31/2017  . IR REPLC DUODEN/JEJUNO TUBE PERCUT W/FLUORO  11/14/2016  . LAPAROTOMY N/A 01/20/2015   Procedure: EXPLORATORY LAPAROTOMY;  Surgeon: Abigail Miyamoto, MD;  Location: [facility] OR;  Service: General;  Laterality: N/A;  . LOWER EXTREMITY ANGIOGRAPHY Left 09/21/2016   Procedure: Lower Extremity Angiography;  Surgeon: Maeola Harman, MD;  Location: [facility] INVASIVE CV LAB;  Service: Cardiovascular;  Laterality: Left;  . LOWER EXTREMITY ANGIOGRAPHY Right 06/27/2017   Procedure: Lower  Extremity Angiography;  Surgeon: Maeola Harman, MD;  Location: [facility] INVASIVE CV LAB;  Service: Cardiovascular;  Laterality: Right;  . LYSIS OF ADHESION N/A 01/20/2015   Procedure: LYSIS OF ADHESIONS < 1 HOUR;  Surgeon: Abigail Miyamoto, MD;  Location: [facility] OR;  Service: General;  Laterality: N/A;  . PERIPHERAL VASCULAR BALLOON ANGIOPLASTY Left 09/21/2016   Procedure: Peripheral Vascular Balloon Angioplasty;  Surgeon: Maeola Harman, MD;  Location: [facility] INVASIVE CV LAB;  Service: Cardiovascular;  Laterality: Left;  drug coated balloon  . PERIPHERAL VASCULAR BALLOON ANGIOPLASTY Right 06/27/2017   Procedure: PERIPHERAL VASCULAR BALLOON ANGIOPLASTY;  Surgeon: Maeola Harman, MD;  Location: [facility] INVASIVE CV LAB;  Service: Cardiovascular;  Laterality: Right;  SFA/TPTRUNK  . TONSILLECTOMY    . TUBAL LIGATION    . VENTRAL HERNIA REPAIR  2015   incarcerated ventral hernia ([facility] 09/2013)/notes 01/10/2015     reports that she has never smoked. She quit smokeless tobacco use about 39 years ago.  Her smokeless tobacco use included snuff. She reports that she does not drink alcohol or use drugs.  Allergies  Allergen Reactions  . Penicillins Itching, Rash and Other (See Comments)    Tolerated amoxicillin, unasyn, zosyn & cephalosporins in the past. Did it involve swelling of the face/tongue/throat, SOB, or low BP? No Did it involve sudden or severe rash/hives, skin peeling, or any reaction on the inside of your mouth or nose? No Did you need to seek medical attention at a hospital or doctor's office? Unknown When did it last happen?5+ years If all above answers are "NO", may proceed with cephalosporin use.     Family History  Problem Relation Age of Onset  . Stroke Mother   . Hypertension Mother   . Diabetes Brother   . Glaucoma Son   . Glaucoma Son   . Heart attack Neg Hx   . Colon cancer Neg Hx   . Stomach cancer Neg Hx     Prior to Admission medications     Medication Sig Start Date End Date Taking? Authorizing Provider  acetaminophen (TYLENOL) 500 MG tablet Take 500 mg by mouth every 6 (six) hours as needed (pain).     [provider]  Amino Acids-Protein Hydrolys (FEEDING SUPPLEMENT, PRO-STAT SUGAR  FREE 64,) LIQD Take 30 mLs by mouth 2 (two) times daily.    [provider]  amLODipine (NORVASC) 5 MG tablet Take 1 tablet (5 mg total) by mouth daily. 11/26/18 11/26/19  Marzetta Board, MD  atorvastatin (LIPITOR) 10 MG tablet TAKE 1 TABLET BY MOUTH EVERY DAY 06/25/19   Sharon Seller, NP  b complex vitamins tablet Take 1 tablet by mouth daily.     [provider]  calcium-vitamin D (OSCAL WITH D) 500-200 MG-UNIT per tablet Take 1 tablet by mouth daily with breakfast.     [provider]  Carboxymethylcellulose Sodium (ARTIFICIAL TEARS OP) Place 1 drop into both eyes 4 (four) times daily.    [provider]  cycloSPORINE (RESTASIS) 0.05 % ophthalmic emulsion USE 1 DROP INTO BOTH EYES TWICE DAILY 01/29/17   Kirt Boys, DO  DULERA 200-5 MCG/ACT AERO TAKE TWO PUFFS BY MOUTH TWICE DAILY 01/13/19   Sharon Seller, NP  feeding supplement (OSMOLITE 1 CAL) LIQD Take 711 mLs by mouth See admin instructions. For 12 hours 2100 - 0900    [provider]  ferrous sulfate 220 (44 Fe) MG/5ML solution Take 7.4 mLs (325 mg total) by mouth daily. 06/10/19   Sharon Seller, NP  fluticasone (FLONASE) 50 MCG/ACT nasal spray SPRAY 2 SPRAYS INTO EACH NOSTRIL EVERY DAY 06/25/19   Sharon Seller, NP  furosemide (LASIX) 8 MG/ML solution 20 mg (2.5 ml) daily unless swelling occurs take 40 mg (5 ml) and then switch back to 20 mg daily 06/10/19   Sharon Seller, NP  gabapentin (NEURONTIN) 250 MG/5ML solution Take 6 mLs (300 mg total) by mouth 2 (two) times daily. 06/10/19   Sharon Seller, NP  gentamicin cream (GARAMYCIN) 0.1 % Apply 1 application topically 3 (three) times daily as needed (dry skin).  06/25/19   Felecia Shelling, DPM  hydrALAZINE (APRESOLINE) 50 MG tablet TAKE 1 TABLET BY MOUTH THREE TIMES A DAY 02/06/19   Lars Masson, MD  insulin aspart (NOVOLOG) 100 UNIT/ML FlexPen Inject 0-6 Units into the skin 3 (three) times daily with meals. New sliding Scale If BS is 151 - 251 = 2 units,  If BS is 251 -  350 = 4 units,  If BS is 351 - 450 = 6 units Call provider if BS runs more than 400 02/03/19   Sharon Seller, NP  Insulin Glargine (LANTUS SOLOSTAR) 100 UNIT/ML Solostar Pen Inject 8 Units into the skin at bedtime. 02/20/19   Sharon Seller, NP  Insulin Pen Needle (B-D UF III MINI PEN NEEDLES) 31G X 5 MM MISC Use twice daily with giving insulin injections. Dx E11.9 07/17/18   Sharon Seller, NP  ipratropium-albuterol (DUONEB) 0.5-2.5 (3) MG/3ML SOLN Take 3 mLs by nebulization every 6 (six) hours as needed (sob). Use 3 times daily x 4 days then every 6 hours as needed. 03/21/16   Janetta Hora, PA-C  isosorbide mononitrate (IMDUR) 30 MG 24 hr tablet TAKE 2 TABLETS BY MOUTH DAILY. PLEASE MAKE ANNUAL APPT WITH DR. Delton See FOR FUTURE REFILLS. THANK YOU. 1ST ATTEMPT. 12/27/18   Lars Masson, MD  latanoprost (XALATAN) 0.005 % ophthalmic solution Place 1 drop into both eyes at bedtime. 06/02/14   Oneal Grout, MD  levalbuterol [facility] HFA) 45 MCG/ACT inhaler Inhale 2 puffs into the lungs every 6 (six) hours as needed for wheezing or shortness of breath. 02/03/16   Kirt Boys, DO  levothyroxine (SYNTHROID) 125 MCG tablet  TAKE 1 TABLET BY MOUTH EVERY DAY 03/18/19   Sharon Seller, NP  loratadine (CLARITIN) 10 MG tablet Take 10 mg by mouth daily as needed for allergies.    [provider]  Multiple Vitamin (MULTIVITAMIN WITH MINERALS) TABS tablet Take 1 tablet by mouth daily.    [provider]  nystatin (MYCOSTATIN/NYSTOP) powder Apply 1 g topically as needed (skin irritation).     [provider]  oxyCODONE (ROXICODONE) 5 MG immediate release  tablet Take 1 tablet (5 mg total) by mouth every 4 (four) hours as needed for severe pain. May take 1/2 tab q4hrs as needed for mild pain. 06/14/18   Sharon Seller, NP  pantoprazole sodium (PROTONIX) 40 mg/20 mL PACK Place 20 mLs (40 mg total) into feeding tube 2 (two) times daily. 06/11/19   Sharon Seller, NP  Probiotic Product (EQL PROBIOTIC COLON SUPPORT PO) Take 1 capsule by mouth daily.    [provider]  sertraline (ZOLOFT) 20 MG/ML concentrated solution Take 2.5 mLs (50 mg total) by mouth daily. 06/10/19   Sharon Seller, NP  warfarin (COUMADIN) 2.5 MG tablet Take 2-3 tablets by mouth daily as directed by Coumadin clinic. 03/24/19   Lars Masson, MD  Water For Irrigation, Sterile (FREE WATER) SOLN Place 180 mLs into feeding tube 3 (three) times daily.     [provider]  White Petrolatum-Mineral Oil (SYSTANE NIGHTTIME) OINT Place 1 application into both eyes at bedtime.     [provider]    Physical Exam: Constitutional: Moderately built and nourished. Vitals:   07/07/19 0134 07/07/19 0200 07/07/19 0245 07/07/19 0400  BP:  138/74 (!) 112/91 (!) 125/94  Pulse:  77 76 73  Resp:  17 (!) 24 20  Temp: 98.8 F (37.1 C)   97.9 F (36.6 C)  TempSrc: Rectal     SpO2:  96% 100% 100%   Eyes: Anicteric no pallor. ENMT: No discharge from the ears eyes nose or mouth. Neck: No JVD appreciated no mass felt.  No neck rigidity. Respiratory: No rhonchi or crepitations. Cardiovascular: S1-S2 heard. Abdomen: Soft nontender bowel sounds present. Musculoskeletal: Chronic ischemic appearing both foot.  With left great toe amputation and multiple wounds. Skin: Multiple wounds. Neurologic: Alert awake oriented to name and place and moves all extremities. Psychiatric: Appears normal normal affect.   Labs on Admission: I have personally reviewed following labs and imaging studies  CBC: Recent Labs  Lab 07/07/19 0122  WBC 5.5  NEUTROABS 3.4  HGB  10.0*  HCT 29.9*  MCV 93.7  PLT 219   Basic Metabolic Panel: Recent Labs  Lab 07/07/19 0122  NA 133*  K 4.2  CL 99  CO2 24  GLUCOSE 108*  BUN 64*  CREATININE 1.69*  CALCIUM 9.6   GFR: CrCl cannot be calculated (Unknown ideal weight.). Liver Function Tests: Recent Labs  Lab 07/07/19 0122  AST 30  ALT 21  ALKPHOS 91  BILITOT 0.5  PROT 8.1  ALBUMIN 4.0   Recent Labs  Lab 07/07/19 0122  LIPASE 26   No results for input(s): AMMONIA in the last 168 hours. Coagulation Profile: Recent Labs  Lab 07/04/19 1321 07/07/19 0127  INR 2.6 2.3*   Cardiac Enzymes: No results for input(s): CKTOTAL, CKMB, CKMBINDEX, TROPONINI in the last 168 hours. BNP (last 3 results) No results for input(s): PROBNP in the last 8760 hours. HbA1C: No results for input(s): HGBA1C in the last 72 hours. CBG: Recent Labs  Lab 07/07/19  0137  GLUCAP 108*   Lipid Profile: No results for input(s): CHOL, HDL, LDLCALC, TRIG, CHOLHDL, LDLDIRECT in the last 72 hours. Thyroid Function Tests: Recent Labs    07/07/19 0123  TSH 1.623   Anemia Panel: No results for input(s): VITAMINB12, FOLATE, FERRITIN, TIBC, IRON, RETICCTPCT in the last 72 hours. Urine analysis:    Component Value Date/Time   COLORURINE YELLOW 07/07/2019 0310   APPEARANCEUR CLEAR 07/07/2019 0310   LABSPEC 1.011 07/07/2019 0310   PHURINE 8.0 07/07/2019 0310   GLUCOSEU NEGATIVE 07/07/2019 0310   HGBUR NEGATIVE 07/07/2019 0310   BILIRUBINUR NEGATIVE 07/07/2019 0310   BILIRUBINUR Neg 11/01/2017 1228   KETONESUR NEGATIVE 07/07/2019 0310   PROTEINUR NEGATIVE 07/07/2019 0310   UROBILINOGEN 0.2 11/01/2017 1228   UROBILINOGEN 0.2 01/27/2015 2308   NITRITE NEGATIVE 07/07/2019 0310   LEUKOCYTESUR NEGATIVE 07/07/2019 0310   Sepsis Labs: @LABRCNTIP (procalcitonin:4,lacticidven:4) ) Recent Results (from the past 240 hour(s))  Blood culture (routine x 2)     Status: None (Preliminary result)   Collection Time: 07/07/19 12:56 AM     Specimen: BLOOD  Result Value Ref Range Status   Specimen Description BLOOD LEFT ANTECUBITAL  Final   Special Requests   Final    BOTTLES DRAWN AEROBIC AND ANAEROBIC Blood Culture adequate volume   Culture PENDING  Incomplete   Report Status PENDING  Incomplete  Blood culture (routine x 2)     Status: None (Preliminary result)   Collection Time: 07/07/19  3:10 AM   Specimen: BLOOD  Result Value Ref Range Status   Specimen Description BLOOD RIGHT ANTECUBITAL  Final   Special Requests   Final    BOTTLES DRAWN AEROBIC AND ANAEROBIC Blood Culture adequate volume Performed at [facility], [address] [place], [place] [address]    Culture PENDING  Incomplete   Report Status PENDING  Incomplete     Radiological Exams on Admission: CT Head Wo Contrast  Result Date: 07/07/2019 CLINICAL DATA:  Altered mental status. EXAM: CT HEAD WITHOUT CONTRAST TECHNIQUE: Contiguous axial images were obtained from the base of the skull through the vertex without intravenous contrast. COMPARISON:  May 18, 2019 FINDINGS: Brain: There is mild cerebral atrophy with widening of the extra-axial spaces and ventricular dilatation. There are areas of decreased attenuation within the white matter tracts of the supratentorial brain, consistent with microvascular disease changes. Vascular: No hyperdense vessel or unexpected calcification. Skull: Normal. Negative for fracture or focal lesion. Sinuses/Orbits: A small air-fluid level is seen within the right maxillary sinus. Other: None. IMPRESSION: 1. No acute intracranial process. 2. Small air-fluid level within the right maxillary sinus which may represent sinusitis. 3. Age-related involutional changes and microvascular disease within the white matter tracts of the supratentorial brain. Electronically Signed   By: Aram Candela M.D.   On: 07/07/2019 02:38   DG Shoulder Left  Result Date: 07/07/2019 CLINICAL DATA:  Atraumatic left shoulder  pain. EXAM: LEFT SHOULDER - 2+ VIEW COMPARISON:  None. FINDINGS: There is no evidence of fracture or dislocation. There is marked severity narrowing of the left glenohumeral articulation with marked severity adjacent degenerative changes involving the left humeral head and left glenoid. Soft tissues are unremarkable. IMPRESSION: Marked degenerative changes of the left shoulder. Electronically Signed   By: Aram Candela M.D.   On: 07/07/2019 02:47   DG Humerus Left  Result Date: 07/07/2019 CLINICAL DATA:  Atraumatic left arm pain. EXAM: LEFT HUMERUS - 2+ VIEW COMPARISON:  None. FINDINGS: There is no evidence  of fracture or other focal bone lesions. Marked severity degenerative changes seen involving the left shoulder. Soft tissues are unremarkable. IMPRESSION: Marked severity degenerative changes involving the left shoulder, without evidence of acute osseous abnormality. Electronically Signed   By: Aram Candela M.D.   On: 07/07/2019 02:45   CT Angio Chest/Abd/Pel for Dissection W and/or Wo Contrast  Result Date: 07/07/2019 CLINICAL DATA:  Chest pain. Abdominal pain. EXAM: CT ANGIOGRAPHY CHEST, ABDOMEN AND PELVIS TECHNIQUE: Non-contrast CT of the chest was initially obtained. Multidetector CT imaging through the chest, abdomen and pelvis was performed using the standard protocol during bolus administration of intravenous contrast. Multiplanar reconstructed images and MIPs were obtained and reviewed to evaluate the vascular anatomy. CONTRAST:  80mL OMNIPAQUE IOHEXOL 350 MG/ML SOLN COMPARISON:  12/25/1998 FINDINGS: CTA CHEST FINDINGS Cardiovascular: Heart size is enlarged. Aortic calcifications are noted. There is no dissection. No aneurysm. Mediastinum/Nodes: --No mediastinal or hilar lymphadenopathy. --No axillary lymphadenopathy. --No supraclavicular lymphadenopathy. --Normal thyroid gland. --The esophagus is unremarkable Lungs/Pleura: There is some mild bronchial wall thickening and mucus plugging at  the left lung base. There is no focal infiltrate. No pneumothorax. No large pleural effusion. Musculoskeletal: No chest wall abnormality. No acute or significant osseous findings. There is mild chronic height loss of the T11 vertebral body. Review of the MIP images confirms the above findings. CTA ABDOMEN AND PELVIS FINDINGS VASCULAR Aorta: Normal caliber aorta without aneurysm, dissection, vasculitis or significant stenosis. Celiac: There is moderate narrowing at the origin of the celiac axis. SMA: Patent without evidence of aneurysm, dissection, vasculitis or significant stenosis. Renals: Both renal arteries are patent without evidence of aneurysm, dissection, vasculitis, fibromuscular dysplasia or significant stenosis. IMA: Patent without evidence of aneurysm, dissection, vasculitis or significant stenosis. Inflow: Patent without evidence of aneurysm, dissection, vasculitis or significant stenosis. Veins: No obvious venous abnormality within the limitations of this arterial phase study. Review of the MIP images confirms the above findings. NON-VASCULAR Hepatobiliary: There are small indeterminate hypoattenuating and hyperattenuating lesions in the liver. These are favored to represent a combination of cysts and flash hemangiomas. Status post cholecystectomy.There is no biliary ductal dilation. Pancreas: Normal contours without ductal dilatation. No peripancreatic fluid collection. Spleen: Unremarkable. Adrenals/Urinary Tract: --Adrenal glands: Unremarkable. --Right kidney/ureter: No hydronephrosis or radiopaque kidney stones. --Left kidney/ureter: No hydronephrosis or radiopaque kidney stones. --Urinary bladder: Unremarkable. Stomach/Bowel: --Stomach/Duodenum: A percutaneous gastrojejunostomy tube is noted. The tube is well position. --Small bowel: Unremarkable. --Colon: There is scattered colonic diverticula without CT evidence for diverticulitis. There is a ventral wall hernia containing a loop of the  transverse colon without evidence for obstruction. --Appendix: Not visualized. No right lower quadrant inflammation or free fluid. Lymphatic: --No retroperitoneal lymphadenopathy. --No mesenteric lymphadenopathy. --No pelvic or inguinal lymphadenopathy. Reproductive: Unremarkable Other: No ascites or free air. The abdominal wall is normal. Musculoskeletal. No acute displaced fractures. Review of the MIP images confirms the above findings. IMPRESSION: 1. No evidence of acute aortic syndrome. 2. Mild bronchial wall thickening and mucus plugging at the left lung base may be secondary to infection or aspiration. 3. Ventral wall hernia containing a loop of transverse colon without evidence for obstruction. 4. Scattered colonic diverticula without evidence for diverticulitis. 5. Well-positioned gastrojejunostomy tube. 6. Aortic Atherosclerosis (ICD10-I70.0). Electronically Signed   By: Katherine Mantle M.D.   On: 07/07/2019 02:48    EKG: Independently reviewed.  A. fib rate controlled.  Assessment/Plan Principal Problem:   Acute encephalopathy Active Problems:   Type 2 diabetes mellitus with chronic kidney disease (HCC)  Persistent atrial fibrillation   Hypothyroidism   Chronic kidney insufficiency, stage 3 (moderate)   PVD (peripheral vascular disease) (HCC)   Pressure ulcer   PEG (percutaneous endoscopic gastrostomy) status (HCC)   Chronic deep vein thrombosis (DVT) of tibial vein of left lower extremity (HCC)   PEG tube malfunction (HCC)   S/P amputation of lesser toe, left (HCC)    1. Brief episode of acute encephalopathy with shaking spells discussed with on-call neurologist Dr. Amada Jupiter who advised at this time to get EEG and also get MRI brain.  Based on which we will have further plan. 2. Chest pain presently chest pain resolved.  Will cycle cardiac markers.  Pain is mostly in the upper back.  CT angiogram does not show anything acute.  Except for possible infiltration. 3. Hypertension  on presentation patient blood pressure was in the 80s systolic which improved.  Will hold off patient amlodipine and Lasix for now patient is on hydralazine and amiodarone which we will continue.  Follow blood pressure trends. 4. Possible pneumonia for which patient will be on empiric antibiotics may discontinue antibiotics if procalcitonin is negative.  Covid test is pending. 5. Chronic kidney disease stage III creatinine mildly elevated from baseline.  Holding of Lasix. 6. Multiple pressure ulcers for which we will get wound team consult. 7. Hypothyroid on Synthroid. 8. Persistent A. fib on Coumadin.  Presently rate controlled. 9. Chronic anemia follow CBC. 10. Diabetes mellitus type 2 as per the patient's daughter patient has not been taking her Lantus recently.  We will keep patient on sliding scale coverage. 11. PEG tube feeds on Osmolite. 12. Peripheral artery disease with ischemic-looking foot which appears to be chronic with history of amputation of the left great toe. Check ABI. 13. History of diastolic CHF holding Lasix for now since patient blood pressure on presentation was low normal.  Last EF measured in February 2021 was 65 to 70%.  Grade 1 diastolic dysfunction.    DVT prophylaxis: Coumadin. Code Status: DNR confirmed with patient's daughter. Family Communication: Patient's daughter. Disposition Plan: Home. Consults called: None. Admission status: Observation.   Eduard Clos MD [facility] Pager [phone].  If 7PM-7AM, please contact night-coverage [email or web address] Password [facility]  07/07/2019, 5:42 AM

## 2019-07-07 NOTE — ED Notes (Signed)
Save tubes and 1 set of blood cultures sent to lab.

## 2019-07-07 NOTE — Progress Notes (Addendum)
ANTICOAGULATION CONSULT NOTE - Initial Con  Pharmacy Consult for Warfarin Indication: DVT, hx Afib  Allergies  Allergen Reactions  . Penicillins Itching, Rash and Other (See Comments)    Tolerated amoxicillin, unasyn, zosyn & cephalosporins in the past. Did it involve swelling of the face/tongue/throat, SOB, or low BP? No Did it involve sudden or severe rash/hives, skin peeling, or any reaction on the inside of your mouth or nose? No Did you need to seek medical attention at a hospital or doctor's office? Unknown When did it last happen?5+ years If all above answers are "NO", may proceed with cephalosporin use.    Vital Signs: Temp: 97.9 F (36.6 C) (04/05 0400) Temp Source: Rectal (04/05 0134) BP: 125/94 (04/05 0400) Pulse Rate: 73 (04/05 0400)  Labs: Recent Labs    07/04/19 1321 07/07/19 0122 07/07/19 0127 07/07/19 0310  HGB  --  10.0*  --   --   HCT  --  29.9*  --   --   PLT  --  219  --   --   LABPROT  --   --  25.4*  --   INR 2.6  --  2.3*  --   CREATININE  --  1.69*  --   --   TROPONINIHS  --  15  --  21*   CrCl cannot be calculated (Unknown ideal weight.).  Medications:  Scheduled:  . atorvastatin  10 mg Per Tube Daily  . feeding supplement  711 mL Oral See admin instructions  . feeding supplement (PRO-STAT SUGAR FREE 64)  30 mL Oral BID  . ferrous sulfate  325 mg Per Tube Daily  . fluticasone  2 spray Each Nare Daily  . free water  180 mL Per Tube TID  . gabapentin  300 mg Per Tube BID  . hydrALAZINE  50 mg Per Tube TID  . insulin aspart  0-6 Units Subcutaneous Q4H  . isosorbide mononitrate  30 mg Oral Daily  . latanoprost  1 drop Both Eyes QHS  . levothyroxine  125 mcg Per Tube Daily  . mometasone-formoterol  2 puff Inhalation BID  . multivitamin with minerals  1 tablet Oral Daily  . pantoprazole sodium  40 mg Per Tube BID  . sertraline  50 mg Per Tube Daily   Infusions:  . doxycycline (VIBRAMYCIN) IV     Assessment: 89 yoF found  confused/incoherent at home, "shaking" spells (doubted seizures), low BP, c/o chest pain in ED. CT to r/o dissection - none, but LLL infiltrates Hx DVT, AFib on Warf 5mg  qd exc 7.5mg  Su/Th, LD 4/4  Baseline coags: H/H = 10/29.96, plts = 219 INR = 2.3 Current abx for CAP: Ceftriaxone/Azithromycin x1, Doxycycline 100mg  IV bid: abx can increase INR  Goal of Therapy:  INR 2-3   Plan:  Warfarin 5mg  today at 1600 Daily PT/INR  Minda Ditto PharmD 07/07/2019,7:54 AM

## 2019-07-07 NOTE — Progress Notes (Signed)
ANTICOAGULATION CONSULT NOTE - Initial Consult  Pharmacy Consult for Warfarin Indication: DVT  Allergies  Allergen Reactions  . Penicillins Itching, Rash and Other (See Comments)    Tolerated amoxicillin, unasyn, zosyn & cephalosporins in the past. Did it involve swelling of the face/tongue/throat, SOB, or low BP? No Did it involve sudden or severe rash/hives, skin peeling, or any reaction on the inside of your mouth or nose? No Did you need to seek medical attention at a hospital or doctor's office? Unknown When did it last happen?5+ years If all above answers are "NO", may proceed with cephalosporin use.         Vital Signs: Temp: 97.9 F (36.6 C) (04/05 0400) Temp Source: Rectal (04/05 0134) BP: 125/94 (04/05 0400) Pulse Rate: 73 (04/05 0400)  Labs: Recent Labs    07/04/19 1321 07/07/19 0122 07/07/19 0127 07/07/19 0310  HGB  --  10.0*  --   --   HCT  --  29.9*  --   --   PLT  --  219  --   --   LABPROT  --   --  25.4*  --   INR 2.6  --  2.3*  --   CREATININE  --  1.69*  --   --   TROPONINIHS  --  15  --  21*    CrCl cannot be calculated (Unknown ideal weight.).   Medical History: Past Medical History:  Diagnosis Date  . Acute respiratory failure ([place])   . Anemia    previous blood transfusions  . Arthritis    "all over"  . Asthma   . Atrial flutter ([place])   . Bradycardia    requiring previous d/c of BB and reduction of amiodarone  . CAD (coronary artery disease)    nonobstructive per notes  . Chronic diastolic CHF (congestive heart failure) ([place])   . CKD (chronic kidney disease), stage III   . Complication of blood transfusion    "got the wrong blood type at [facility] in ~ 2015; no adverse reaction that we are aware of"/daughter, [name] (01/27/2016)  . COPD (chronic obstructive pulmonary disease) ([place])   . Depression    "light case"  . DVT (deep venous thrombosis) ([place]) 01/2016   a. LLE DVT 01/2016 - switched from Eliquis to Coumadin.  [name]  Dyspnea    with some activity  . Gastric stenosis    a. s/p stomach tube  . GERD (gastroesophageal reflux disease)   . History of blood transfusion    "several" (01/27/2016)  . History of stomach ulcers   . Hyperlipidemia   . Hypertension   . Hypothyroidism   . On home oxygen therapy    "2L; 9PM til 9AM" (06/27/2017)  . PAD (peripheral artery disease) ([place])    a. prior gangrene, toe amputations, intervention.  [name] PAF (paroxysmal atrial fibrillation) ([place])   . Paraesophageal hernia   . Perforated gastric ulcer ([place])   . Pneumonia    "a few times" (06/27/2017)  . Seasonal allergies   . SIADH (syndrome of inappropriate ADH production) ([place])    [name] 01/10/2015  . Small bowel obstruction ([place])    "I don't know how many" (01/11/2015)  . Stroke [facility])    several- no residual  . Type II diabetes mellitus ([place])    "related to prednisone use  for > 20 yr; once predinose stopped; no more DM RX" (01/27/2016)  . UTI (urinary tract infection) 02/08/2016  . Ventral hernia with bowel obstruction  Medications:  Scheduled:  . amLODipine  5 mg Per Tube Daily  . atorvastatin  10 mg Per Tube Daily  . feeding supplement  711 mL Oral See admin instructions  . feeding supplement (PRO-STAT SUGAR FREE 64)  30 mL Oral BID  . ferrous sulfate  325 mg Per Tube Daily  . fluticasone  2 spray Each Nare Daily  . free water  180 mL Per Tube TID  . furosemide  20 mg Per Tube Daily  . gabapentin  300 mg Per Tube BID  . hydrALAZINE  50 mg Per Tube TID  . insulin aspart  0-6 Units Subcutaneous Q4H  . isosorbide mononitrate  30 mg Oral Daily  . latanoprost  1 drop Both Eyes QHS  . levothyroxine  125 mcg Per Tube Daily  . mometasone-formoterol  2 puff Inhalation BID  . multivitamin with minerals  1 tablet Oral Daily  . pantoprazole sodium  40 mg Per Tube BID  . sertraline  50 mg Per Tube Daily   Infusions:  . doxycycline (VIBRAMYCIN) IV      Assessment: 28 yoF admitted with mental status changes on  chronic warfarin for hx of DVT. HD = med rec pending, LD? Baseline coags: H/H = 10/29.96, plts = 219 INR = 2.3 Goal of Therapy:  INR 2-3    Plan:  Daily PT/INR  Dorrene German 07/07/2019,6:05 AM

## 2019-07-07 NOTE — ED Triage Notes (Addendum)
PER EMS: Pt is coming from home with c/o altered mental status. Per the patients daughter, the patient was "okay" until she went to bed last night. Patient began to have uncontrollable arm and leg movements and started to make abnormal noises. Pt was unable to answer questions when EMS arrived on scene. Pt is now conscious, alert and oriented x4, and able to follow commands. States she has arm, leg, neck, and upper back pain. Denies chest pain and denies SOB. Pt is ambulatory at baseline.   Hx UTI's, feeding tube, diabetes, prev stroke.  EMS VITALS: BP 140/77 HR 80-100 afib RR 18 SPO2 100% RA CBG 190

## 2019-07-07 NOTE — Progress Notes (Signed)
Patient is a 47 female with history of proximal A. fib, diastolic CHF, chronic kidney disease stage IIIb, coronary artery disease, diabetes mellitus type 2, peripheral vascular disease, status post G-tube placement probiotic stenosis who presented from home after brought by her daughter for the evaluation of brief history of confusion.  Patient was admitted for the evaluation of altered mental status.  On presentation, she was initially hypotensive.  She was also complaining of upper back pain, chest pain.  CT angiogram of the chest/abdomen/pelvis was negative for aortic dissection but shows chronic changes with possible infiltrate in the left lower lobe.  CT head was unremarkable.  She was started on antibiotics for possible aspiration pneumonia.Speech therapy consulted. MRI, EEG pending. She was also found to have multiple pressure ulcers, ulcers on the legs.  ABIs this morning showed mild bilateral lower extremity arterial disease.Wound care saw her.We will also request for physical therapy. Patient seen by Dr. Hal Hope this morning.  I agree with his assessment and plan.

## 2019-07-07 NOTE — Progress Notes (Signed)
Tele called and stated that patient brady downed to the 30's. Took patients VS. Notified MD by [facility] page.   VS  T - 99.0 oral BP 131/53 HR - 64 RR - 16 O2 - 98% room air.

## 2019-07-07 NOTE — Consult Note (Signed)
[facility] Nurse Consult Note: Reason for Consult:Chronic ischemia to feet. Recent amputation right great toe.  History left second and fourth digits.   Wound type:ischemia with neuuropathy Pressure Injury POA: /NA Measurement: 0.2 cm abrasions to right 2 and 3rd digits  Wound SWH:QPRFF, scabbed Drainage (amount, consistency, odor) none Periwound:darkened skin to periwound/dorsal foot.   Dressing procedure/placement/frequency:Cleanse bilateral feet with soap and water and pat dry.  Apply betadine to toes daily.  Cover with foam dressing.  Will not follow at this time.  Please re-consult if needed.  Domenic Moras MSN, RN, FNP-BC CWON Wound, Ostomy, Continence Nurse Pager [phone]

## 2019-07-07 NOTE — ED Notes (Addendum)
Patient urinated in brief. Peri care performed. PER MD, hold the in and out cath and apply purewick.

## 2019-07-07 NOTE — Progress Notes (Signed)
Spoke with RN to see if pt is available. Pt is in MRI will try back later to see if pt is available.

## 2019-07-07 NOTE — ED Notes (Signed)
Patient transported to CT 

## 2019-07-07 NOTE — Procedures (Signed)
Patient Name: Shannon [name]  MRN: [id number]  Epilepsy Attending: Lora Havens  Referring Physician/Provider: Dr Gean Birchwood Date: 07/07/2019 Duration: 25.08 mins  Patient history: 86yo F with transient alteration of awareness and shaking. EEG to evaluate for seizure.  Level of alertness: awake, asleep  AEDs during EEG study: Gabapentin  Technical aspects: This EEG study was done with scalp electrodes positioned according to the 10-20 International system of electrode placement. Electrical activity was acquired at a sampling rate of 500Hz  and reviewed with a high frequency filter of 70Hz  and a low frequency filter of 1Hz . EEG data were recorded continuously and digitally stored.   DESCRIPTION:The posterior dominant rhythm consists of 9 Hz activity of moderate voltage (25-35 uV) seen predominantly in posterior head regions, symmetric and reactive to eye opening and eye closing. Sleep was characterized by vertex waves, maximal frontocentral. Photic driving was not seen during photic stimulation. Hyperventilation was not performed.  IMPRESSION: This study is within normal limits. No seizures or epileptiform discharges were seen throughout the recording.  Shannon [name]

## 2019-07-07 NOTE — ED Notes (Signed)
Lab called to add on additional blood work.

## 2019-07-07 NOTE — Progress Notes (Signed)
ABI's have been completed. Preliminary results can be found in CV Proc through chart review.   07/07/19 9:09 AM Shannon [name] RVT

## 2019-07-08 ENCOUNTER — Other Ambulatory Visit: Payer: Self-pay | Admitting: *Deleted

## 2019-07-08 DIAGNOSIS — G9340 Encephalopathy, unspecified: Secondary | ICD-10-CM | POA: Diagnosis not present

## 2019-07-08 LAB — BASIC METABOLIC PANEL
Anion gap: 6 (ref 5–15)
BUN: 50 mg/dL — ABNORMAL HIGH (ref 8–23)
CO2: 24 mmol/L (ref 22–32)
Calcium: 8.9 mg/dL (ref 8.9–10.3)
Chloride: 106 mmol/L (ref 98–111)
Creatinine, Ser: 1.27 mg/dL — ABNORMAL HIGH (ref 0.44–1.00)
GFR calc Af Amer: 42 mL/min — ABNORMAL LOW (ref >60)
GFR calc non Af Amer: 36 mL/min — ABNORMAL LOW (ref >60)
Glucose, Bld: 149 mg/dL — ABNORMAL HIGH (ref 70–99)
Potassium: 4.5 mmol/L (ref 3.5–5.1)
Sodium: 136 mmol/L (ref 135–145)

## 2019-07-08 LAB — CBC
HCT: 27.6 % — ABNORMAL LOW (ref 36.0–46.0)
Hemoglobin: 8.9 g/dL — ABNORMAL LOW (ref 12.0–15.0)
MCH: 31.2 pg (ref 26.0–34.0)
MCHC: 32.2 g/dL (ref 30.0–36.0)
MCV: 96.8 fL (ref 80.0–100.0)
Platelets: 185 10*3/uL (ref 150–400)
RBC: 2.85 MIL/uL — ABNORMAL LOW (ref 3.87–5.11)
RDW: 17.1 % — ABNORMAL HIGH (ref 11.5–15.5)
WBC: 3.7 10*3/uL — ABNORMAL LOW (ref 4.0–10.5)
nRBC: 0 % (ref 0.0–0.2)

## 2019-07-08 LAB — PROTIME-INR
INR: 3.3 — ABNORMAL HIGH (ref 0.8–1.2)
Prothrombin Time: 33.2 seconds — ABNORMAL HIGH (ref 11.4–15.2)

## 2019-07-08 LAB — GLUCOSE, CAPILLARY
Glucose-Capillary: 136 mg/dL — ABNORMAL HIGH (ref 70–99)
Glucose-Capillary: 147 mg/dL — ABNORMAL HIGH (ref 70–99)
Glucose-Capillary: 153 mg/dL — ABNORMAL HIGH (ref 70–99)
Glucose-Capillary: 155 mg/dL — ABNORMAL HIGH (ref 70–99)
Glucose-Capillary: 112 mg/dL — ABNORMAL HIGH (ref 70–99)
Glucose-Capillary: 96 mg/dL (ref 70–99)

## 2019-07-08 MED ORDER — AZITHROMYCIN 250 MG PO TABS: 500.0000 mg | ORAL_TABLET | Freq: Every day | ORAL | 0 refills | Status: DC

## 2019-07-08 MED ORDER — AZITHROMYCIN 250 MG PO TABS: 250.0000 mg | ORAL_TABLET | Freq: Every day | ORAL | 0 refills | Status: AC

## 2019-07-08 MED ORDER — CEFDINIR 300 MG PO CAPS: 300.0000 mg | ORAL_CAPSULE | Freq: Two times a day (BID) | ORAL | 0 refills | Status: AC

## 2019-07-08 NOTE — Evaluation (Signed)
Physical Therapy Evaluation Patient Details Name: Shannon [name] MRN: [id number] DOB: 29-Dec-1923 Today's Date: 07/08/2019   History of Present Illness  86 yo female admitted with acute encephalopathy. Hx of asthma, COPD, CHF, CKD, Afib/flutter, J tube, pyloric stenosis, PAD, CVA, L toe amputation, chronic ischemia/wounds  Clinical Impression  On eval, pt was Min guard assist for mobility. She walked ~175 feet with use of a RW. Pt participated well. She is hopeful to d/c home soon. Will recommend HHPT f/u if pt/family decide they wish to have it. Will follow during hospital stay.     Follow Up Recommendations Supervision/Assistance - 24 hour;Home health PT(HHPT if family/pt wants)    Equipment Recommendations  None recommended by PT    Recommendations for Other Services       Precautions / Restrictions Precautions Precautions: Fall Precaution Comments: J tube Restrictions Weight Bearing Restrictions: No      Mobility  Bed Mobility Overal bed mobility: Needs Assistance Bed Mobility: Supine to Sit     Supine to sit: Min guard;HOB elevated     General bed mobility comments: Increased time.  Transfers Overall transfer level: Needs assistance Equipment used: Rolling walker (2 wheeled) Transfers: Sit to/from Stand Sit to Stand: Min guard         General transfer comment: Increased time.  VCs safety, technique, hand placement. Cues for safety, hand placement.  Ambulation/Gait Ambulation/Gait assistance: Min guard Gait Distance (Feet): 175 Feet Assistive device: Rolling walker (2 wheeled) Gait Pattern/deviations: Step-through pattern;Decreased stride length     General Gait Details: Close guard for safety. Slow but steady gait speed.  Stairs            Wheelchair Mobility    Modified Rankin (Stroke Patients Only)       Balance Overall balance assessment: Needs assistance         Standing balance support: Bilateral upper extremity supported Standing  balance-Leahy Scale: Poor                               Pertinent Vitals/Pain Pain Assessment: Faces Faces Pain Scale: Hurts even more Pain Location: L shoulder, neck Pain Descriptors / Indicators: Aching;Discomfort;Sore Pain Intervention(s): Monitored during session;Repositioned    Home Living Family/patient expects to be discharged to:: Private residence   Available Help at Discharge: Family;Available 24 hours/day Type of Home: House Home Access: Ramped entrance     Home Layout: One level Home Equipment: Walker - 4 wheels;Cane - single point;Hand held Careers information officer - 2 wheels;Bedside commode;Tub bench Additional Comments: patient reports her daughter has a handicap daughter of her own to also care for    Prior Function Level of Independence: Needs assistance   Gait / Transfers Assistance Needed: rollator for short ambulation distances   ADL's / Homemaking Assistance Needed: daughter assists with tub transfers and medicine management        Hand Dominance   Dominant Hand: Right    Extremity/Trunk Assessment   Upper Extremity Assessment Upper Extremity Assessment: Generalized weakness    Lower Extremity Assessment Lower Extremity Assessment: Generalized weakness    Cervical / Trunk Assessment Cervical / Trunk Assessment: Kyphotic  Communication   Communication: HOH  Cognition Arousal/Alertness: Awake/alert Behavior During Therapy: WFL for tasks assessed/performed Overall Cognitive Status: Within Functional Limits for tasks assessed  General Comments      Exercises     Assessment/Plan    PT Assessment Patient needs continued PT services  PT Problem List Decreased strength;Decreased mobility;Decreased activity tolerance;Decreased balance;Decreased knowledge of use of DME;Pain       PT Treatment Interventions DME instruction;Gait training;Therapeutic activities;Therapeutic  exercise;Patient/family education;Balance training;Functional mobility training    PT Goals (Current goals can be found in the Care Plan section)  Acute Rehab PT Goals Patient Stated Goal: home PT Goal Formulation: With patient Time For Goal Achievement: 07/22/19 Potential to Achieve Goals: Good    Frequency Min 3X/week   Barriers to discharge        Co-evaluation               AM-PAC PT "6 Clicks" Mobility  Outcome Measure Help needed turning from your back to your side while in a flat bed without using bedrails?: A Little Help needed moving from lying on your back to sitting on the side of a flat bed without using bedrails?: A Little Help needed moving to and from a bed to a chair (including a wheelchair)?: A Little Help needed standing up from a chair using your arms (e.g., wheelchair or bedside chair)?: A Little Help needed to walk in hospital room?: A Little Help needed climbing 3-5 steps with a railing? : A Little 6 Click Score: 18    End of Session Equipment Utilized During Treatment: Gait belt Activity Tolerance: Patient tolerated treatment well Patient left: in chair;with call bell/phone within reach;with chair alarm set   PT Visit Diagnosis: Muscle weakness (generalized) (M62.81)    Time: 4098-1191 PT Time Calculation (min) (ACUTE ONLY): 29 min   Charges:   PT Evaluation $PT Eval Low Complexity: 1 Low PT Treatments $Gait Training: 8-22 mins         Faye Ramsay, PT Acute Rehabilitation

## 2019-07-08 NOTE — Discharge Summary (Addendum)
Physician Discharge Summary  [name]:[id number] DOB: [birth year] DOA: 07/07/2019  PCP: Sharon Seller, NP  Admit date: 07/07/2019 Discharge date: 07/08/2019  Admitted From: Home Disposition:  Home  Discharge Condition:Stable CODE STATUS:DNR Diet recommendation: Heart Healthy  Brief/Interim Summary:  Patient is a 86 female with history of proximal A. fib, diastolic CHF, chronic kidney disease stage IIIb, coronary artery disease, diabetes mellitus type 2, peripheral vascular disease, status post G-tube placement probiotic stenosis who presented from home after brought by her daughter for the evaluation of brief history of confusion.  Patient was admitted for the evaluation of altered mental status.  On presentation, she was initially hypotensive.  She was also complaining of upper back pain, chest pain.  CT angiogram of the chest/abdomen/pelvis was negative for aortic dissection but shows chronic changes with possible infiltrate in the left lower lobe.  CT head was unremarkable.  She was started on antibiotics for possible aspiration pneumonia. Currently she is alert and oriented.  MRI did not show any acute intracranial findings.  EEG was negative for seizures.  She was evaluated by physical therapy and recommended home health.  She is hemodynamically stable for discharge today.  Following problems were addressed during hospitalization:  Acute encephalopathy: Resolved.  Currently alert and oriented.  EEG did not show any seizures.  MRI did not show any acute intracranial normalities.  Chest pain: Resolved.  CT angiogram negative for acute findings.  Troponins not impressive for coronary disease.  Possible aspiration pneumonia: CT imaging showed possible left lower lobe infiltrate.  Currently she is saturating fine on room air.  No cough or dyspnea.  Antibiotics changed to oral.  She denied speech therapy evaluation.  Hypertension: Continue home medications  Chronic kidney disease  stage IIIa: Currently kidney function at baseline.  Bilateral chronic venous stasis changes: Continue supportive care, wound care at home.  ABI not impressive for severe arterial disease.  Hypothyroidism: Continue Synthyroid  Diabetes type 2: Continue home medications  History of diastolic CHF: Currently euvolemic.  Continue home medications.  Permanent A. fib: Rate is currently controlled.  Continue Coumadin . Chronic normocytic anemia: Currently stable.  Monitor  CBC as an outpatient. Discharge Diagnoses:  Principal Problem:   Acute encephalopathy Active Problems:   Type 2 diabetes mellitus with chronic kidney disease (HCC)   Persistent atrial fibrillation   Hypothyroidism   Chronic kidney insufficiency, stage 3 (moderate)   PVD (peripheral vascular disease) (HCC)   Pressure ulcer   PEG (percutaneous endoscopic gastrostomy) status (HCC)   Chronic deep vein thrombosis (DVT) of tibial vein of left lower extremity (HCC)   PEG tube malfunction (HCC)   S/P amputation of lesser toe, left (HCC)   AMS (altered mental status)    Discharge Instructions  Discharge Instructions    Diet - low sodium heart healthy   Complete by: As directed    Discharge instructions   Complete by: As directed    1)Please take prescribed medications as instructed. 2)Follow up with your PCP in a week.  Do a CBC, BMP test during the follow-up.   Increase activity slowly   Complete by: As directed      Allergies as of 07/08/2019      Reactions   Penicillins Itching, Rash, Other (See Comments)   Tolerated amoxicillin, unasyn, zosyn & cephalosporins in the past. Did it involve swelling of the face/tongue/throat, SOB, or low BP? No Did it involve sudden or severe rash/hives, skin peeling, or any reaction on the inside of  your mouth or nose? No Did you need to seek medical attention at a hospital or doctor's office? Unknown When did it last happen?5+ years If all above answers are "NO", may  proceed with cephalosporin use.      Medication List    TAKE these medications   acetaminophen 500 MG tablet Commonly known as: TYLENOL Take 500 mg by mouth every 6 (six) hours as needed (pain).   amLODipine 5 MG tablet Commonly known as: NORVASC Take 1 tablet (5 mg total) by mouth daily.   ARTIFICIAL TEARS OP Place 1 drop into both eyes 4 (four) times daily.   atorvastatin 10 MG tablet Commonly known as: LIPITOR TAKE 1 TABLET BY MOUTH EVERY DAY   azithromycin 250 MG tablet Commonly known as: Zithromax Take 2 tablets (500 mg total) by mouth daily for 3 days. Take 1 tablet daily for 3 days. Start taking on: July 09, 2019   b complex vitamins tablet Take 1 tablet by mouth daily.   calcium-vitamin D 500-200 MG-UNIT tablet Commonly known as: OSCAL WITH D Take 1 tablet by mouth daily with breakfast.   cefdinir 300 MG capsule Commonly known as: OMNICEF Take 1 capsule (300 mg total) by mouth 2 (two) times daily for 4 days.   cycloSPORINE 0.05 % ophthalmic emulsion Commonly known as: Restasis USE 1 DROP INTO BOTH EYES TWICE DAILY What changed:   how much to take  how to take this  when to take this  additional instructions   Dulera 200-5 MCG/ACT Aero Generic drug: mometasone-formoterol TAKE TWO PUFFS BY MOUTH TWICE DAILY What changed: See the new instructions.   EQL PROBIOTIC COLON SUPPORT PO Take 1 capsule by mouth daily.   feeding supplement (PRO-STAT SUGAR FREE 64) Liqd Take 30 mLs by mouth 2 (two) times daily.   feeding supplement Liqd Take 711 mLs by mouth See admin instructions. For 12 hours 2100 - 0900   ferrous sulfate 220 (44 Fe) MG/5ML solution Take 7.4 mLs (325 mg total) by mouth daily.   fluticasone 50 MCG/ACT nasal spray Commonly known as: FLONASE SPRAY 2 SPRAYS INTO EACH NOSTRIL EVERY DAY What changed: See the new instructions.   free water Soln Place 180 mLs into feeding tube 3 (three) times daily.   furosemide 8 MG/ML  solution Commonly known as: LASIX 20 mg (2.5 ml) daily unless swelling occurs take 40 mg (5 ml) and then switch back to 20 mg daily   gabapentin 250 MG/5ML solution Commonly known as: NEURONTIN Take 6 mLs (300 mg total) by mouth 2 (two) times daily.   gentamicin cream 0.1 % Commonly known as: GARAMYCIN Apply 1 application topically 3 (three) times daily as needed (dry skin).   hydrALAZINE 50 MG tablet Commonly known as: APRESOLINE TAKE 1 TABLET BY MOUTH THREE TIMES A DAY   insulin aspart 100 UNIT/ML FlexPen Commonly known as: NOVOLOG Inject 0-6 Units into the skin 3 (three) times daily with meals. New sliding Scale If BS is 151 - 251 = 2 units,  If BS is 251 -  350 = 4 units,  If BS is 351 - 450 = 6 units Call provider if BS runs more than 400 What changed: additional instructions   Insulin Pen Needle 31G X 5 MM Misc Commonly known as: B-D UF III MINI PEN NEEDLES Use twice daily with giving insulin injections. Dx E11.9   ipratropium-albuterol 0.5-2.5 (3) MG/3ML Soln Commonly known as: DUONEB Take 3 mLs by nebulization every 6 (six) hours as needed (sob).  Use 3 times daily x 4 days then every 6 hours as needed.   isosorbide mononitrate 30 MG 24 hr tablet Commonly known as: IMDUR TAKE 2 TABLETS BY MOUTH DAILY. PLEASE MAKE ANNUAL APPT WITH DR. Delton See FOR FUTURE REFILLS. THANK YOU. 1ST ATTEMPT. What changed: See the new instructions.   Lantus SoloStar 100 UNIT/ML Solostar Pen Generic drug: insulin glargine Inject 8 Units into the skin at bedtime.   latanoprost 0.005 % ophthalmic solution Commonly known as: XALATAN Place 1 drop into both eyes at bedtime.   levalbuterol 45 MCG/ACT inhaler Commonly known as: Xopenex HFA Inhale 2 puffs into the lungs every 6 (six) hours as needed for wheezing or shortness of breath.   levothyroxine 125 MCG tablet Commonly known as: SYNTHROID TAKE 1 TABLET BY MOUTH EVERY DAY   loratadine 10 MG tablet Commonly known as: CLARITIN Take 10 mg  by mouth daily as needed for allergies.   multivitamin with minerals Tabs tablet Take 1 tablet by mouth daily.   nystatin powder Commonly known as: MYCOSTATIN/NYSTOP Apply 1 g topically as needed (skin irritation).   oxyCODONE 5 MG immediate release tablet Commonly known as: Roxicodone Take 1 tablet (5 mg total) by mouth every 4 (four) hours as needed for severe pain. May take 1/2 tab q4hrs as needed for mild pain.   pantoprazole sodium 40 mg/20 mL Pack Commonly known as: PROTONIX Place 20 mLs (40 mg total) into feeding tube 2 (two) times daily.   sertraline 20 MG/ML concentrated solution Commonly known as: ZOLOFT Take 2.5 mLs (50 mg total) by mouth daily.   Systane Nighttime Oint Place 1 application into both eyes at bedtime.   warfarin 2.5 MG tablet Commonly known as: COUMADIN Take as directed. If you are unsure how to take this medication, talk to your nurse or doctor. Original instructions: Take 2-3 tablets by mouth daily as directed by Coumadin clinic. What changed:   how much to take  how to take this  when to take this  additional instructions      Follow-up Information    Sharon Seller, NP Follow up in 1 week(s).   Specialty: Geriatric Medicine Contact information: [address] [place] [address] [phone]          Allergies  Allergen Reactions  . Penicillins Itching, Rash and Other (See Comments)    Tolerated amoxicillin, unasyn, zosyn & cephalosporins in the past. Did it involve swelling of the face/tongue/throat, SOB, or low BP? No Did it involve sudden or severe rash/hives, skin peeling, or any reaction on the inside of your mouth or nose? No Did you need to seek medical attention at a hospital or doctor's office? Unknown When did it last happen?5+ years If all above answers are "NO", may proceed with cephalosporin use.     Consultations:  None   Procedures/Studies: EEG  Result Date: 07/07/2019 Charlsie Quest,  MD     07/07/2019  5:17 PM Patient Name: Shannon [name] MRN: [id number] Epilepsy Attending: Charlsie Quest Referring Physician/Provider: Dr Midge Minium Date: 07/07/2019 Duration: 25.08 mins Patient history: 86yo F with transient alteration of awareness and shaking. EEG to evaluate for seizure. Level of alertness: awake, asleep AEDs during EEG study: Gabapentin Technical aspects: This EEG study was done with scalp electrodes positioned according to the 10-20 International system of electrode placement. Electrical activity was acquired at a sampling rate of 500Hz  and reviewed with a high frequency filter of 70Hz  and a low frequency filter of 1Hz .  EEG data were recorded continuously and digitally stored. DESCRIPTION:The posterior dominant rhythm consists of 9 Hz activity of moderate voltage (25-35 uV) seen predominantly in posterior head regions, symmetric and reactive to eye opening and eye closing. Sleep was characterized by vertex waves, maximal frontocentral. Photic driving was not seen during photic stimulation. Hyperventilation was not performed. IMPRESSION: This study is within normal limits. No seizures or epileptiform discharges were seen throughout the recording. Charlsie Quest   CT Head Wo Contrast  Result Date: 07/07/2019 CLINICAL DATA:  Altered mental status. EXAM: CT HEAD WITHOUT CONTRAST TECHNIQUE: Contiguous axial images were obtained from the base of the skull through the vertex without intravenous contrast. COMPARISON:  May 18, 2019 FINDINGS: Brain: There is mild cerebral atrophy with widening of the extra-axial spaces and ventricular dilatation. There are areas of decreased attenuation within the white matter tracts of the supratentorial brain, consistent with microvascular disease changes. Vascular: No hyperdense vessel or unexpected calcification. Skull: Normal. Negative for fracture or focal lesion. Sinuses/Orbits: A small air-fluid level is seen within the right maxillary sinus.  Other: None. IMPRESSION: 1. No acute intracranial process. 2. Small air-fluid level within the right maxillary sinus which may represent sinusitis. 3. Age-related involutional changes and microvascular disease within the white matter tracts of the supratentorial brain. Electronically Signed   By: Aram Candela M.D.   On: 07/07/2019 02:38   MR BRAIN WO CONTRAST  Result Date: 07/07/2019 CLINICAL DATA:  86 year old female with encephalopathy. Paroxysmal atrial fibrillation. Altered mental status, confusion. EXAM: MRI HEAD WITHOUT CONTRAST TECHNIQUE: Multiplanar, multiecho pulse sequences of the brain and surrounding structures were obtained without intravenous contrast. COMPARISON:  Head CT earlier today.  Brain MRI 03/23/2014. FINDINGS: Study is intermittently degraded by motion artifact despite repeated imaging attempts. Brain: Small chronic infarcts in the bilateral cerebellum (larger on the left) and bilateral thalami are stable since 03-14-2014. Patchy chronic bilateral cerebral white matter T2 and FLAIR hyperintensity. No definite cerebral cortical encephalomalacia or chronic cerebral blood products. No restricted diffusion or evidence of acute infarction. No midline shift, mass effect, evidence of mass lesion, ventriculomegaly, extra-axial collection or acute intracranial hemorrhage. Pituitary within normal limits. Vascular: Major intracranial vascular flow voids are stable since 03-14-2014. Mild intracranial artery tortuosity. Skull and upper cervical spine: Advanced cervical spine degeneration, including chronic but increased and severe degenerative ligamentous hypertrophy about the odontoid (series 5, image 12) resulting in mild spinal stenosis at the cervicomedullary junction. Additional mild degenerative cervical spinal stenosis. Normal background bone marrow signal. Sinuses/Orbits: Stable and negative. Other: Mastoids are clear. Grossly normal visible internal auditory structures. Negative face and scalp soft  tissues. IMPRESSION: 1.  No acute intracranial abnormality. 2. Chronic small vessel ischemia not significantly changed since 03-14-2014, most pronounced in the thalami and cerebellum. 3. Severe degenerative changes at the skull base and and in the visible cervical spine with spinal stenosis including at the cervicomedullary junction. Electronically Signed   By: Odessa Fleming M.D.   On: 07/07/2019 15:12   DG Shoulder Left  Result Date: 07/07/2019 CLINICAL DATA:  Atraumatic left shoulder pain. EXAM: LEFT SHOULDER - 2+ VIEW COMPARISON:  None. FINDINGS: There is no evidence of fracture or dislocation. There is marked severity narrowing of the left glenohumeral articulation with marked severity adjacent degenerative changes involving the left humeral head and left glenoid. Soft tissues are unremarkable. IMPRESSION: Marked degenerative changes of the left shoulder. Electronically Signed   By: Aram Candela M.D.   On: 07/07/2019 02:47   DG Humerus Left  Result Date: 07/07/2019 CLINICAL DATA:  Atraumatic left arm pain. EXAM: LEFT HUMERUS - 2+ VIEW COMPARISON:  None. FINDINGS: There is no evidence of fracture or other focal bone lesions. Marked severity degenerative changes seen involving the left shoulder. Soft tissues are unremarkable. IMPRESSION: Marked severity degenerative changes involving the left shoulder, without evidence of acute osseous abnormality. Electronically Signed   By: Aram Candela M.D.   On: 07/07/2019 02:45   VAS [place] ABI WITH/WO TBI  Result Date: 07/07/2019 LOWER EXTREMITY DOPPLER STUDY Indications: Rest pain, and peripheral artery disease. High Risk Factors: Diabetes.  Limitations: Today's exam was limited due to Bilateral great toe amputations. Comparison Study: 09/03/2018 - Right 0.96 Left 0.98 Performing Technologist: Olen Cordial RVT  Examination Guidelines: A complete evaluation includes at minimum, Doppler waveform signals and systolic blood pressure reading at the level of bilateral brachial,  anterior tibial, and posterior tibial arteries, when vessel segments are accessible. Bilateral testing is considered an integral part of a complete examination. Photoelectric Plethysmograph (PPG) waveforms and toe systolic pressure readings are included as required and additional duplex testing as needed. Limited examinations for reoccurring indications may be performed as noted.  ABI Findings: +--------+------------------+-----+---------+--------+ Right   Rt Pressure (mmHg)IndexWaveform Comment  +--------+------------------+-----+---------+--------+ [id number]                    triphasic         +--------+------------------+-----+---------+--------+ PTA     121               0.78 biphasic          +--------+------------------+-----+---------+--------+ DP      127               0.82 biphasic          +--------+------------------+-----+---------+--------+ +--------+------------------+-----+----------+-------+ Left    Lt Pressure (mmHg)IndexWaveform  Comment +--------+------------------+-----+----------+-------+ [id number]                    triphasic         +--------+------------------+-----+----------+-------+ PTA     139               0.90 monophasic        +--------+------------------+-----+----------+-------+ DP      121               0.78 biphasic          +--------+------------------+-----+----------+-------+ +-------+-----------+-----------+------------+------------+ ABI/TBIToday's ABIToday's TBIPrevious ABIPrevious TBI +-------+-----------+-----------+------------+------------+ Right  0.82                  0.96                     +-------+-----------+-----------+------------+------------+ Left   0.9                   0.98                     +-------+-----------+-----------+------------+------------+  Summary: Right: Resting right ankle-brachial index indicates mild right lower extremity arterial disease. Unable to obtain TBI due to great  toe amputation. Left: Resting left ankle-brachial index indicates mild left lower extremity arterial disease. Unable to obtain TBI due to great toe amputation.  *See table(s) above for measurements and observations.  Electronically signed by Sherald Hess MD on 07/07/2019 at 5:28:18 PM.   Final    CT Angio Chest/Abd/Pel for Dissection W and/or Wo Contrast  Result Date: 07/07/2019 CLINICAL DATA:  Chest pain. Abdominal pain. EXAM: CT ANGIOGRAPHY CHEST, ABDOMEN  AND PELVIS TECHNIQUE: Non-contrast CT of the chest was initially obtained. Multidetector CT imaging through the chest, abdomen and pelvis was performed using the standard protocol during bolus administration of intravenous contrast. Multiplanar reconstructed images and MIPs were obtained and reviewed to evaluate the vascular anatomy. CONTRAST:  80mL OMNIPAQUE IOHEXOL 350 MG/ML SOLN COMPARISON:  12/25/1998 FINDINGS: CTA CHEST FINDINGS Cardiovascular: Heart size is enlarged. Aortic calcifications are noted. There is no dissection. No aneurysm. Mediastinum/Nodes: --No mediastinal or hilar lymphadenopathy. --No axillary lymphadenopathy. --No supraclavicular lymphadenopathy. --Normal thyroid gland. --The esophagus is unremarkable Lungs/Pleura: There is some mild bronchial wall thickening and mucus plugging at the left lung base. There is no focal infiltrate. No pneumothorax. No large pleural effusion. Musculoskeletal: No chest wall abnormality. No acute or significant osseous findings. There is mild chronic height loss of the T11 vertebral body. Review of the MIP images confirms the above findings. CTA ABDOMEN AND PELVIS FINDINGS VASCULAR Aorta: Normal caliber aorta without aneurysm, dissection, vasculitis or significant stenosis. Celiac: There is moderate narrowing at the origin of the celiac axis. SMA: Patent without evidence of aneurysm, dissection, vasculitis or significant stenosis. Renals: Both renal arteries are patent without evidence of aneurysm,  dissection, vasculitis, fibromuscular dysplasia or significant stenosis. IMA: Patent without evidence of aneurysm, dissection, vasculitis or significant stenosis. Inflow: Patent without evidence of aneurysm, dissection, vasculitis or significant stenosis. Veins: No obvious venous abnormality within the limitations of this arterial phase study. Review of the MIP images confirms the above findings. NON-VASCULAR Hepatobiliary: There are small indeterminate hypoattenuating and hyperattenuating lesions in the liver. These are favored to represent a combination of cysts and flash hemangiomas. Status post cholecystectomy.There is no biliary ductal dilation. Pancreas: Normal contours without ductal dilatation. No peripancreatic fluid collection. Spleen: Unremarkable. Adrenals/Urinary Tract: --Adrenal glands: Unremarkable. --Right kidney/ureter: No hydronephrosis or radiopaque kidney stones. --Left kidney/ureter: No hydronephrosis or radiopaque kidney stones. --Urinary bladder: Unremarkable. Stomach/Bowel: --Stomach/Duodenum: A percutaneous gastrojejunostomy tube is noted. The tube is well position. --Small bowel: Unremarkable. --Colon: There is scattered colonic diverticula without CT evidence for diverticulitis. There is a ventral wall hernia containing a loop of the transverse colon without evidence for obstruction. --Appendix: Not visualized. No right lower quadrant inflammation or free fluid. Lymphatic: --No retroperitoneal lymphadenopathy. --No mesenteric lymphadenopathy. --No pelvic or inguinal lymphadenopathy. Reproductive: Unremarkable Other: No ascites or free air. The abdominal wall is normal. Musculoskeletal. No acute displaced fractures. Review of the MIP images confirms the above findings. IMPRESSION: 1. No evidence of acute aortic syndrome. 2. Mild bronchial wall thickening and mucus plugging at the left lung base may be secondary to infection or aspiration. 3. Ventral wall hernia containing a loop of  transverse colon without evidence for obstruction. 4. Scattered colonic diverticula without evidence for diverticulitis. 5. Well-positioned gastrojejunostomy tube. 6. Aortic Atherosclerosis (ICD10-I70.0). Electronically Signed   By: Katherine Mantle M.D.   On: 07/07/2019 02:48       Subjective: Patient seen and examined at the bedside this morning.  Hemodynamically stable for discharge today.  Discharge Exam: Vitals:   07/08/19 1417 07/08/19 1419  BP: (!) 180/72 (!) 166/56  Pulse: 67 66  Resp: 16   Temp: 98.5 F (36.9 C)   SpO2: 100%    Vitals:   07/08/19 0918 07/08/19 0921 07/08/19 1417 07/08/19 1419  BP: (!) 151/75 (!) 151/75 (!) 180/72 (!) 166/56  Pulse:  73 67 66  Resp:   16   Temp:   98.5 F (36.9 C)   TempSrc:   Oral   SpO2:  100%     General: Pt is alert, awake, not in acute distress Cardiovascular: RRR, S1/S2 +, no rubs, no gallops Respiratory: CTA bilaterally, no wheezing, no rhonchi Abdominal: Soft, NT, ND, bowel sounds + Extremities: no edema, no cyanosis    The results of significant diagnostics from this hospitalization (including imaging, microbiology, ancillary and laboratory) are listed below for reference.     Microbiology: Recent Results (from the past 240 hour(s))  Blood culture (routine x 2)     Status: None (Preliminary result)   Collection Time: 07/07/19 12:56 AM   Specimen: BLOOD  Result Value Ref Range Status   Specimen Description BLOOD LEFT ANTECUBITAL  Final   Special Requests   Final    BOTTLES DRAWN AEROBIC AND ANAEROBIC Blood Culture adequate volume   Culture   Final    NO GROWTH 1 DAY Performed at [facility], [address] [place], [place] [address]    Report Status PENDING  Incomplete  Blood culture (routine x 2)     Status: None (Preliminary result)   Collection Time: 07/07/19  3:10 AM   Specimen: BLOOD  Result Value Ref Range Status   Specimen Description BLOOD RIGHT ANTECUBITAL  Final   Special Requests    Final    BOTTLES DRAWN AEROBIC AND ANAEROBIC Blood Culture adequate volume Performed at [facility], [address] [place], [place] [address]    Culture   Final    NO GROWTH 1 DAY Performed at [facility], [address] [place], [place] [address]    Report Status PENDING  Incomplete  SARS CORONAVIRUS 2 (TAT 6-24 HRS) Nasopharyngeal Nasopharyngeal Swab     Status: None   Collection Time: 07/07/19  3:10 AM   Specimen: Nasopharyngeal Swab  Result Value Ref Range Status   SARS Coronavirus 2 NEGATIVE NEGATIVE Final    Comment: (NOTE) SARS-CoV-2 target nucleic acids are NOT DETECTED. The SARS-CoV-2 RNA is generally detectable in upper and lower respiratory specimens during the acute phase of infection. Negative results do not preclude SARS-CoV-2 infection, do not rule out co-infections with other pathogens, and should not be used as the sole basis for treatment or other patient management decisions. Negative results must be combined with clinical observations, patient history, and epidemiological information. The expected result is Negative. Fact Sheet for Patients: HairSlick.no Fact Sheet for Healthcare Providers: quierodirigir.com This test is not yet approved or cleared by the [place] FDA and  has been authorized for detection and/or diagnosis of SARS-CoV-2 by FDA under an Emergency Use Authorization (EUA). This EUA will remain  in effect (meaning this test can be used) for the duration of the COVID-19 declaration under Section 56 4(b)(1) of the Act, 21 U.S.C. section 360bbb-3(b)(1), unless the authorization is terminated or revoked sooner. Performed at [facility], [address] [place], [place] [address]      Labs: BNP (last 3 results) Recent Labs    11/25/18 0530 07/07/19 0128  BNP 233.9* 242.4*   Basic Metabolic Panel: Recent Labs  Lab 07/07/19 0122 07/08/19 0539   NA 133* 136  K 4.2 4.5  CL 99 106  CO2 24 24  GLUCOSE 108* 149*  BUN 64* 50*  CREATININE 1.69* 1.27*  CALCIUM 9.6 8.9   Liver Function Tests: Recent Labs  Lab 07/07/19 0122  AST 30  ALT 21  ALKPHOS 91  BILITOT 0.5  PROT 8.1  ALBUMIN 4.0   Recent Labs  Lab 07/07/19 0122  LIPASE 26   No results for input(s): AMMONIA in the last 168 hours. CBC: Recent Labs  Lab 07/07/19 0122 07/08/19 0539  WBC 5.5 3.7*  NEUTROABS 3.4  --   HGB 10.0* 8.9*  HCT 29.9* 27.6*  MCV 93.7 96.8  PLT 219 185   Cardiac Enzymes: No results for input(s): CKTOTAL, CKMB, CKMBINDEX, TROPONINI in the last 168 hours. BNP: Invalid input(s): POCBNP CBG: Recent Labs  Lab 07/08/19 0135 07/08/19 0532 07/08/19 0801 07/08/19 0951 07/08/19 1200  GLUCAP 136* 147* 153* 155* 112*   D-Dimer No results for input(s): DDIMER in the last 72 hours. Hgb A1c No results for input(s): HGBA1C in the last 72 hours. Lipid Profile No results for input(s): CHOL, HDL, LDLCALC, TRIG, CHOLHDL, LDLDIRECT in the last 72 hours. Thyroid function studies Recent Labs    07/07/19 0123  TSH 1.623   Anemia work up No results for input(s): VITAMINB12, FOLATE, FERRITIN, TIBC, IRON, RETICCTPCT in the last 72 hours. Urinalysis    Component Value Date/Time   COLORURINE YELLOW 07/07/2019 0310   APPEARANCEUR CLEAR 07/07/2019 0310   LABSPEC 1.011 07/07/2019 0310   PHURINE 8.0 07/07/2019 0310   GLUCOSEU NEGATIVE 07/07/2019 0310   HGBUR NEGATIVE 07/07/2019 0310   BILIRUBINUR NEGATIVE 07/07/2019 0310   BILIRUBINUR Neg 11/01/2017 1228   KETONESUR NEGATIVE 07/07/2019 0310   PROTEINUR NEGATIVE 07/07/2019 0310   UROBILINOGEN 0.2 11/01/2017 1228   UROBILINOGEN 0.2 01/27/2015 2308   NITRITE NEGATIVE 07/07/2019 0310   LEUKOCYTESUR NEGATIVE 07/07/2019 0310   Sepsis Labs Invalid input(s): PROCALCITONIN,  WBC,  LACTICIDVEN Microbiology Recent Results (from the past 240 hour(s))  Blood culture (routine x 2)     Status: None  (Preliminary result)   Collection Time: 07/07/19 12:56 AM   Specimen: BLOOD  Result Value Ref Range Status   Specimen Description BLOOD LEFT ANTECUBITAL  Final   Special Requests   Final    BOTTLES DRAWN AEROBIC AND ANAEROBIC Blood Culture adequate volume   Culture   Final    NO GROWTH 1 DAY Performed at [facility], [address] [place], [place] [address]    Report Status PENDING  Incomplete  Blood culture (routine x 2)     Status: None (Preliminary result)   Collection Time: 07/07/19  3:10 AM   Specimen: BLOOD  Result Value Ref Range Status   Specimen Description BLOOD RIGHT ANTECUBITAL  Final   Special Requests   Final    BOTTLES DRAWN AEROBIC AND ANAEROBIC Blood Culture adequate volume Performed at [facility], [address], [place], [place] [address]    Culture   Final    NO GROWTH 1 DAY Performed at [facility], [address] [place], [place] [address]    Report Status PENDING  Incomplete  SARS CORONAVIRUS 2 (TAT 6-24 HRS) Nasopharyngeal Nasopharyngeal Swab     Status: None   Collection Time: 07/07/19  3:10 AM   Specimen: Nasopharyngeal Swab  Result Value Ref Range Status   SARS Coronavirus 2 NEGATIVE NEGATIVE Final    Comment: (NOTE) SARS-CoV-2 target nucleic acids are NOT DETECTED. The SARS-CoV-2 RNA is generally detectable in upper and lower respiratory specimens during the acute phase of infection. Negative results do not preclude SARS-CoV-2 infection, do not rule out co-infections with other pathogens, and should not be used as the sole basis for treatment or other patient management decisions. Negative results must be combined with clinical observations, patient history, and epidemiological information. The expected result is  Negative. Fact Sheet for Patients: HairSlick.no Fact Sheet for Healthcare Providers: quierodirigir.com This test is not yet approved or cleared  by the [place] FDA and  has been authorized for detection and/or diagnosis of SARS-CoV-2 by FDA under an Emergency Use Authorization (EUA). This EUA will remain  in effect (meaning this test can be used) for the duration of the COVID-19 declaration under Section 56 4(b)(1) of the Act, 21 U.S.C. section 360bbb-3(b)(1), unless the authorization is terminated or revoked sooner. Performed at [facility], [address], [place], [place] [address]     Please note: You were cared for by a hospitalist during your hospital stay. Once you are discharged, your primary care physician will handle any further medical issues. Please note that NO REFILLS for any discharge medications will be authorized once you are discharged, as it is imperative that you return to your primary care physician (or establish a relationship with a primary care physician if you do not have one) for your post hospital discharge needs so that they can reassess your need for medications and monitor your lab values.    Time coordinating discharge: 40 minutes  SIGNED:   Burnadette Pop, MD  [facility] 07/08/2019, 2:44 PM Pager [phone]  If 7PM-7AM, please contact night-coverage [email or web address] Password [email or web address]

## 2019-07-08 NOTE — Patient Outreach (Signed)
[facility]) Care Management  07/08/2019  HARMONY SANDELL 15-Jul-1923 [id number]   RN Health Coach discipline closure. Patient has been admitted into hospital.   [place] Management [phone]

## 2019-07-08 NOTE — Progress Notes (Signed)
ANTICOAGULATION CONSULT NOTE - Initial Con  Pharmacy Consult for Warfarin Indication: DVT, hx Afib  Allergies  Allergen Reactions  . Penicillins Itching, Rash and Other (See Comments)    Tolerated amoxicillin, unasyn, zosyn & cephalosporins in the past. Did it involve swelling of the face/tongue/throat, SOB, or low BP? No Did it involve sudden or severe rash/hives, skin peeling, or any reaction on the inside of your mouth or nose? No Did you need to seek medical attention at a hospital or doctor's office? Unknown When did it last happen?5+ years If all above answers are "NO", may proceed with cephalosporin use.    Vital Signs: Temp: 98.1 F (36.7 C) (04/06 0534) Temp Source: Oral (04/06 0534) BP: 157/62 (04/06 0534) Pulse Rate: 70 (04/06 0534)  Labs: Recent Labs    07/07/19 0122 07/07/19 0127 07/07/19 0310 07/08/19 0539  HGB 10.0*  --   --  8.9*  HCT 29.9*  --   --  27.6*  PLT 219  --   --  185  LABPROT  --  25.4*  --  33.2*  INR  --  2.3*  --  3.3*  CREATININE 1.69*  --   --  1.27*  TROPONINIHS 15  --  21*  --    CrCl cannot be calculated (Unknown ideal weight.).  Medications:  Scheduled:  . atorvastatin  10 mg Oral Daily  . feeding supplement (OSMOLITE 1.2 CAL)  711 mL Per Tube Q24H  . feeding supplement (PRO-STAT SUGAR FREE 64)  30 mL Oral BID  . ferrous sulfate  300 mg Oral Daily  . fluticasone  2 spray Each Nare Daily  . free water  180 mL Per Tube TID  . gabapentin  300 mg Oral BID  . hydrALAZINE  50 mg Per Tube TID  . insulin aspart  0-6 Units Subcutaneous Q4H  . isosorbide mononitrate  30 mg Oral Daily  . latanoprost  1 drop Both Eyes QHS  . levothyroxine  125 mcg Oral Q0600  . mometasone-formoterol  2 puff Inhalation BID  . multivitamin  15 mL Per Tube Daily  . pantoprazole sodium  40 mg Oral BID  . sertraline  50 mg Oral Daily  . Warfarin - Pharmacist Dosing Inpatient   Does not apply q1600   Infusions:  . doxycycline (VIBRAMYCIN) IV 100 mg  (07/07/19 2028)   Assessment: 89 yoF found confused/incoherent at home, "shaking" spells (doubted seizures), low BP, c/o chest pain in ED. CT to r/o dissection - none, but LLL infiltrates Hx DVT, AFib on Warf 5mg  qd exc 7.5mg  Su/Th, LD 4/4  Baseline coags: H/H = 10/29.96, plts = 219 INR = 2.3 Current abx for CAP: Ceftriaxone/Azithromycin x1, Doxycycline 100mg  IV bid: abx can increase INR  Today, 07/08/2019 INR increased from 2.3 (4/5) > 3.3  Hgb decr 8.9, Plt wnl, 185 CL diet & PTA tube feeds continued  Goal of Therapy:  INR 2-3   Plan:  No Warfarin today Daily PT/INR  Minda Ditto PharmD 07/08/2019,7:14 AM

## 2019-07-08 NOTE — TOC Transition Note (Signed)
Transition of Care [facility]) - CM/SW Discharge Note   Patient Details  Name: Shannon [name] MRN: [id number] Date of Birth: 1923/11/24  [facility]) CM/SW Contact:  Trish Mage, LCSW Phone Number: 07/08/2019, 10:47 AM   Clinical Narrative:  Spoke with patient in follow up to PT recommendation of [facility].  Patient politely declined.  With permission, called her daughter, with whom she lives, to confirm. Daughter deferred to her mother's ability to make decisions for herself.  Patient has DME walker, bedside commode and shower chair in the home. TOC sign off.    Final next level of care: Home/Self Care Barriers to Discharge: No Barriers Identified   Patient Goals and CMS Choice        Discharge Placement                       Discharge Plan and Services                                     Social Determinants of Health (SDOH) Interventions     Readmission Risk Interventions No flowsheet data found.

## 2019-07-08 NOTE — Progress Notes (Signed)
Went over discharge information w/ patient and her daughter. Pt and daughter verbalized understanding.

## 2019-07-09 ENCOUNTER — Telehealth: Payer: Self-pay | Admitting: *Deleted

## 2019-07-09 ENCOUNTER — Other Ambulatory Visit: Payer: Self-pay

## 2019-07-09 ENCOUNTER — Telehealth: Payer: Medicare Other | Admitting: Nurse Practitioner

## 2019-07-09 DIAGNOSIS — I5032 Chronic diastolic (congestive) heart failure: Secondary | ICD-10-CM

## 2019-07-09 NOTE — Telephone Encounter (Signed)
Transition Care Management Follow-up Telephone Call  Date of discharge and from where: 07/08/2019 [facility]  How have you been since you were released from the hospital? Better. On 2 antibiotics  Any questions or concerns? No   Items Reviewed:  Did the pt receive and understand the discharge instructions provided? Yes   Medications obtained and verified? Yes   Any new allergies since your discharge? No   Dietary orders reviewed? Yes  Do you have support at home? Yes   Other (ie: DME, Home Health, etc) Home Health patient declined  Functional Questionnaire: (I = Independent and D = Dependent) ADL's: I with assistance   Bathing/Dressing- D   Meal Prep- D  Eating- D  Maintaining continence- I  Transferring/Ambulation- I with assistance  Managing Meds- D   Follow up appointments reviewed:    PCP Hospital f/u appt confirmed? Yes  Scheduled to see Janett Billow 07/18/19.  [facility] f/u appt confirmed? No    Are transportation arrangements needed? No   If their condition worsens, is the pt aware to call  their PCP or go to the ED? Yes  Was the patient provided with contact information for the PCP's office or ED? Yes  Was the pt encouraged to call back with questions or concerns? Yes

## 2019-07-10 ENCOUNTER — Ambulatory Visit: Payer: Self-pay | Admitting: *Deleted

## 2019-07-10 ENCOUNTER — Other Ambulatory Visit: Payer: Self-pay | Admitting: *Deleted

## 2019-07-10 NOTE — Patient Outreach (Signed)
[place] [facility]) Care Management  07/10/2019  COZETTA SEIF [birth year] [id number]   Referral received from hospital liaison as member was recently discharged.  Call placed to member/daughter, no answer.  HIPAA compliant voice message left and unsuccessful outreach letter sent.  Will follow up within the next 3-4 business days.  [name], [place], MSN [place] [phone]

## 2019-07-11 DIAGNOSIS — Z23 Encounter for immunization: Secondary | ICD-10-CM | POA: Diagnosis not present

## 2019-07-12 LAB — CULTURE, BLOOD (ROUTINE X 2)
Culture: NO GROWTH
Culture: NO GROWTH
Special Requests: ADEQUATE
Special Requests: ADEQUATE

## 2019-07-13 ENCOUNTER — Other Ambulatory Visit: Payer: Self-pay | Admitting: Nurse Practitioner

## 2019-07-13 DIAGNOSIS — K922 Gastrointestinal hemorrhage, unspecified: Secondary | ICD-10-CM

## 2019-07-14 ENCOUNTER — Other Ambulatory Visit: Payer: Self-pay | Admitting: Cardiology

## 2019-07-14 DIAGNOSIS — K922 Gastrointestinal hemorrhage, unspecified: Secondary | ICD-10-CM

## 2019-07-14 NOTE — Telephone Encounter (Signed)
Patient is no longer on tablet form. Patient is on liquid and rx was recently sent to pharmacy

## 2019-07-15 ENCOUNTER — Telehealth: Payer: Self-pay

## 2019-07-15 ENCOUNTER — Other Ambulatory Visit: Payer: Self-pay | Admitting: *Deleted

## 2019-07-15 MED ORDER — PANTOPRAZOLE SODIUM 40 MG PO TBEC: 40.0000 mg | DELAYED_RELEASE_TABLET | Freq: Every day | ORAL | 1 refills | Status: DC

## 2019-07-15 NOTE — Telephone Encounter (Signed)
Please send back to PCP to deal with prior authorization

## 2019-07-15 NOTE — Addendum Note (Signed)
Addended by: Nuala Alpha on: 07/15/2019 04:22 PM   Modules accepted: Orders

## 2019-07-15 NOTE — Telephone Encounter (Signed)
Called the pts Daughter and informed her that Dr. Meda Coffee advised that she reach out to the PCP for prior auth of liquid pantoprazole. Per the pts Daughter, she already did and they would not assist her, and advised that she reach out to Dr. Meda Coffee to advise on the PO form, for she was the original MD to prescribe.  Daughter states that the cost of her liquid pantoprazole will cost them $5,000 a month. Informed Dr. Meda Coffee, being she cannot get assistance from PCP, and the daughter thinks she can swallow the PO form, she is ok to call in pantoprazole 40 mg po daily for the pt.  Informed the pts daughter of this, and informed her that this was called in.  Daughter verbalized understanding and agrees with this plan.  Daughter was more than gracious for all the assistance provided.

## 2019-07-15 NOTE — Telephone Encounter (Signed)
Karlene Einstein, this pt's daughter called requesting a refill for the pantoprazole tabs. Dr. Meda Coffee originally prescribed the pills, but pt was having a hard time swallowing them. Her primary dr. prescribed the liquid, but it's too expensive, so she will try taking the pills again.  If you have any questions, please call daughter, Soledad Gerlach at [phone].  Thanks

## 2019-07-15 NOTE — Telephone Encounter (Signed)
Dr. Meda Coffee, pts daughter is calling about pantoprazole tablet refills.  Pt is currently getting this medication in liquid form via a feeding tube, that her PCP has been ordering and following.  Her daughter states the liquid pts PCP has her on that's administered via her feeding tube, is too expensive now and wants the pills you originally called in, even though pt has a hard time swallowing them.  Shouldn't daughter call the pts PCP for further advisement on this, being she has been getting these meds from him. Please advise!

## 2019-07-15 NOTE — Patient Outreach (Signed)
[facility] [facility]) Care Management  07/15/2019  JAYLANI MCGUINN 08-Feb-1924 [id number]   Outreach attempt #2, successful to daughter [name] however she state she is on her way to a doctor's appointment.  Request made for her to call this care manager back once she is available.  Will await call back, if no call back will follow up within the next 3-4 business days.  [name], [place], MSN [place] [phone]

## 2019-07-16 ENCOUNTER — Ambulatory Visit (INDEPENDENT_AMBULATORY_CARE_PROVIDER_SITE_OTHER): Payer: Medicare Other | Admitting: Podiatry

## 2019-07-16 ENCOUNTER — Other Ambulatory Visit: Payer: Self-pay

## 2019-07-16 DIAGNOSIS — I739 Peripheral vascular disease, unspecified: Secondary | ICD-10-CM

## 2019-07-16 DIAGNOSIS — L97512 Non-pressure chronic ulcer of other part of right foot with fat layer exposed: Secondary | ICD-10-CM | POA: Diagnosis not present

## 2019-07-16 DIAGNOSIS — E0859 Diabetes mellitus due to underlying condition with other circulatory complications: Secondary | ICD-10-CM | POA: Diagnosis not present

## 2019-07-16 MED ORDER — GENTAMICIN SULFATE 0.1 % EX CREA: 1.0000 "application " | TOPICAL_CREAM | Freq: Three times a day (TID) | CUTANEOUS | 1 refills | Status: DC | PRN

## 2019-07-16 NOTE — Telephone Encounter (Signed)
pantoprazole (PROTONIX) 40 MG tablet Medication Date: 07/15/2019 Department: [address] Office Ordering/Authorizing: Dorothy Spark, MD  Order Providers  Prescribing Provider Encounter Provider  Dorothy Spark, MD Jeremy Johann, CMA  Outpatient Medication Detail   Disp Refills Start End   pantoprazole (PROTONIX) 40 MG tablet 90 tablet 1 07/15/2019    Sig - Route: Take 1 tablet (40 mg total) by mouth daily. - Oral   Sent to pharmacy as: pantoprazole (PROTONIX) 40 MG tablet   E-Prescribing Status: Receipt confirmed by pharmacy (07/15/2019  4:13 PM EDT)   Pharmacy  [id number] - [place], [place]

## 2019-07-18 ENCOUNTER — Ambulatory Visit (INDEPENDENT_AMBULATORY_CARE_PROVIDER_SITE_OTHER): Payer: Medicare Other | Admitting: Nurse Practitioner

## 2019-07-18 ENCOUNTER — Encounter: Payer: Self-pay | Admitting: Nurse Practitioner

## 2019-07-18 ENCOUNTER — Other Ambulatory Visit: Payer: Self-pay

## 2019-07-18 ENCOUNTER — Ambulatory Visit: Payer: Self-pay | Admitting: *Deleted

## 2019-07-18 VITALS — BP 146/68 | HR 64 | Temp 97.3°F | Ht 66.0 in | Wt 139.0 lb

## 2019-07-18 DIAGNOSIS — K9423 Gastrostomy malfunction: Secondary | ICD-10-CM

## 2019-07-18 DIAGNOSIS — J449 Chronic obstructive pulmonary disease, unspecified: Secondary | ICD-10-CM

## 2019-07-18 DIAGNOSIS — J4489 Other specified chronic obstructive pulmonary disease: Secondary | ICD-10-CM

## 2019-07-18 DIAGNOSIS — J302 Other seasonal allergic rhinitis: Secondary | ICD-10-CM

## 2019-07-18 DIAGNOSIS — Z66 Do not resuscitate: Secondary | ICD-10-CM

## 2019-07-18 DIAGNOSIS — J690 Pneumonitis due to inhalation of food and vomit: Secondary | ICD-10-CM | POA: Diagnosis not present

## 2019-07-18 DIAGNOSIS — K219 Gastro-esophageal reflux disease without esophagitis: Secondary | ICD-10-CM | POA: Diagnosis not present

## 2019-07-18 NOTE — Patient Instructions (Signed)
To try to avoid hard blowing of the nose Use plain nasal saline as needed

## 2019-07-18 NOTE — Progress Notes (Signed)
Careteam: Patient Care Team: [name], NP as PCP - General (Geriatric Medicine) Dorothy Spark, MD as PCP - Cardiology (Cardiology) Edrick Kins, DPM as Consulting Physician (Podiatry) Dorothy Spark, MD as Consulting Physician (Cardiology) [name] [name], NP as Nurse Practitioner (Geriatric Medicine) Camillo Flaming, [place] as Referring Physician (Optometry) [name], RN as [place] Management  PLACE OF SERVICE:  [place]  Advanced Directive information    Allergies  Allergen Reactions  . Penicillins Itching, Rash and Other (See Comments)    Tolerated amoxicillin, unasyn, zosyn & cephalosporins in the past. Did it involve swelling of the face/tongue/throat, SOB, or low BP? No Did it involve sudden or severe rash/hives, skin peeling, or any reaction on the inside of your mouth or nose? No Did you need to seek medical attention at a hospital or doctor's office? Unknown When did it last happen?5+ years If all above answers are "NO", may proceed with cephalosporin use.     Chief Complaint  Patient presents with  . [facility] follow-up 4/5-4/6, Acute encephalopathy   . Medication Management    Change protonix back to tablet. Liquid cost 5000 dollars.      HPI: Patient is a 86 y.o. female seen today in office for TOC appt.  history of proximal A. fib, diastolic CHF, chronic kidney disease stage IIIb, coronary artery disease, diabetes mellitus type 2, peripheral vascular disease, status post G-tube placement probiotic stenosis who went to the ED after daughter noticed increase in confusion. She was found to be hypotensive with complaint of upper back pain and chest pain with shortness of breath. CT angiogram of the chest/abdomen/pelvis was negative for aortic dissection but shows chronic changes with possible infiltrate in the left lower lobe. CT head was unremarkable. She was started on antibiotics for  possible aspiration pneumonia and discharged on cefdinir 300 mg BID for 4 additional days.   Continues to follow up with podiatrist and saw earlier this week for evaluation, treatment and monitoring for evaluation of ulcers on right toe.   GERD- able to take protonix orally without issue, liquid was too expensive.   Reports allergies are bad now, sinus headache. Using zyrtec and flonase twice daily and using a nasal rinse daily.   Never heard about help for the tube feeding and loss after she ate.  Review of Systems:  Review of Systems  Constitutional: Negative for chills, fever and malaise/fatigue.  Respiratory: Negative for cough, sputum production and shortness of breath.   Cardiovascular: Negative for chest pain and leg swelling.  Gastrointestinal: Positive for abdominal pain (occasional at j tube insertion). Negative for constipation and diarrhea.  Genitourinary: Negative for dysuria, frequency and urgency.  Musculoskeletal: Positive for back pain, myalgias and neck pain.  Neurological: Negative for dizziness and headaches.    Past Medical History:  Diagnosis Date  . Acute respiratory failure ([place])   . Anemia    previous blood transfusions  . Arthritis    "all over"  . Asthma   . Atrial flutter ([place])   . Bradycardia    requiring previous d/c of BB and reduction of amiodarone  . CAD (coronary artery disease)    nonobstructive per notes  . Chronic diastolic CHF (congestive heart failure) ([place])   . CKD (chronic kidney disease), stage III   . Complication of blood transfusion    "got the wrong blood type at [facility] in ~ 2015; no adverse reaction that  we are aware of"/daughter, [name] (01/27/2016)  . COPD (chronic obstructive pulmonary disease) ([place])   . Depression    "light case"  . DVT (deep venous thrombosis) ([place]) 01/2016   a. LLE DVT 01/2016 - switched from Eliquis to Coumadin.  [name] Dyspnea    with some activity  . Gastric stenosis    a. s/p stomach tube  . GERD  (gastroesophageal reflux disease)   . History of blood transfusion    "several" (01/27/2016)  . History of stomach ulcers   . Hyperlipidemia   . Hypertension   . Hypothyroidism   . On home oxygen therapy    "2L; 9PM til 9AM" (06/27/2017)  . PAD (peripheral artery disease) ([place])    a. prior gangrene, toe amputations, intervention.  [name] PAF (paroxysmal atrial fibrillation) ([place])   . Paraesophageal hernia   . Perforated gastric ulcer ([place])   . Pneumonia    "a few times" (06/27/2017)  . Seasonal allergies   . SIADH (syndrome of inappropriate ADH production) ([place])    [name] 01/10/2015  . Small bowel obstruction ([place])    "I don't know how many" (01/11/2015)  . Stroke [facility])    several- no residual  . Type II diabetes mellitus ([place])    "related to prednisone use  for > 20 yr; once predinose stopped; no more DM RX" (01/27/2016)  . UTI (urinary tract infection) 02/08/2016  . Ventral hernia with bowel obstruction    Past Surgical History:  Procedure Laterality Date  . ABDOMINAL AORTOGRAM N/A 09/21/2016   Procedure: Abdominal Aortogram;  Surgeon: Waynetta Sandy, MD;  Location: [place] [facility];  Service: Cardiovascular;  Laterality: N/A;  . AMPUTATION Left 09/25/2016   Procedure: AMPUTATION DIGIT- LEFT 2ND AND 3RD TOES;  Surgeon: Rosetta Posner, MD;  Location: [place];  Service: Vascular;  Laterality: Left;  . AMPUTATION Right 05/03/2018   Procedure: AMPUTATION RIGHT GREAT TOE;  Surgeon: Rosetta Posner, MD;  Location: [place];  Service: Vascular;  Laterality: Right;  . CATARACT EXTRACTION W/ INTRAOCULAR LENS  IMPLANT, BILATERAL    . CHOLECYSTECTOMY OPEN    . COLECTOMY    . ESOPHAGOGASTRODUODENOSCOPY N/A 01/19/2014   Procedure: ESOPHAGOGASTRODUODENOSCOPY (EGD);  Surgeon: Irene Shipper, MD;  [facility];  Service: Endoscopy;  Laterality: N/A;  . ESOPHAGOGASTRODUODENOSCOPY N/A 01/20/2014   Procedure: ESOPHAGOGASTRODUODENOSCOPY (EGD);  Surgeon: Irene Shipper, MD;  Location: [facility];  Service: Endoscopy;  Laterality: N/A;  . ESOPHAGOGASTRODUODENOSCOPY N/A 03/19/2014   Procedure: ESOPHAGOGASTRODUODENOSCOPY (EGD);  Surgeon: Milus Banister, MD;  Location: [facility];  Service: Endoscopy;  Laterality: N/A;  . ESOPHAGOGASTRODUODENOSCOPY N/A 07/08/2015   Procedure: ESOPHAGOGASTRODUODENOSCOPY (EGD);  Surgeon: Doran Stabler, MD;  [facility] ENDOSCOPY;  Service: Endoscopy;  Laterality: N/A;  . ESOPHAGOGASTRODUODENOSCOPY (EGD) WITH PROPOFOL N/A 09/15/2015   Procedure: ESOPHAGOGASTRODUODENOSCOPY (EGD) WITH PROPOFOL;  Surgeon: Manus Gunning, MD;  [facility];  Service: Gastroenterology;  Laterality: N/A;  . GASTROJEJUNOSTOMY     hx/notes 01/10/2015  . GASTROJEJUNOSTOMY N/A 09/23/2015   Procedure: OPEN GASTROJEJUNOSTOMY TUBE PLACEMENT;  Surgeon: Arta Bruce Kinsinger, MD;  [facility];  Service: General;  Laterality: N/A;  . GLAUCOMA SURGERY Bilateral   . HERNIA REPAIR  2015  . IR CM INJ ANY COLONIC TUBE W/FLUORO  01/05/2017  . IR CM INJ ANY COLONIC TUBE W/FLUORO  06/07/2017  . IR CM INJ ANY COLONIC TUBE W/FLUORO  11/05/2017  . IR CM INJ ANY COLONIC TUBE W/FLUORO  09/18/2018  . IR  CM INJ ANY COLONIC TUBE W/FLUORO  03/06/2019  . IR CM INJ ANY COLONIC TUBE W/FLUORO  03/11/2019  . IR GENERIC HISTORICAL  01/07/2016   IR GJ TUBE CHANGE 01/07/2016 Jacqulynn Cadet, MD [facility]  . IR GENERIC HISTORICAL  01/27/2016   IR MECH REMOV OBSTRUC MAT ANY COLON TUBE W/FLUORO 01/27/2016 Markus Daft, MD [facility]  . IR GENERIC HISTORICAL  02/07/2016   IR PATIENT EVAL TECH 0-60 MINS Darrell K Allred, PA-C [facility]  . IR GENERIC HISTORICAL  02/08/2016   IR GJ TUBE CHANGE 02/08/2016 Greggory Keen, MD [facility]  . IR GENERIC HISTORICAL  01/06/2016   IR GJ TUBE CHANGE 01/06/2016 [facility] OUT REF  . IR GENERIC HISTORICAL  05/02/2016   IR CM INJ ANY COLONIC TUBE W/FLUORO 05/02/2016 Arne Cleveland, MD [facility]  . IR GENERIC HISTORICAL  05/15/2016   IR GJ TUBE  CHANGE 05/15/2016 Sandi Mariscal, MD [facility]  . IR GENERIC HISTORICAL  06/28/2016   IR GJ TUBE CHANGE 06/28/2016 [facility]  . IR GJ TUBE CHANGE  02/20/2017  . IR GJ TUBE CHANGE  05/10/2017  . IR GJ TUBE CHANGE  07/06/2017  . IR GJ TUBE CHANGE  08/02/2017  . IR GJ TUBE CHANGE  10/15/2017  . IR GJ TUBE CHANGE  12/26/2017  . IR [place] TUBE CHANGE  04/08/2018  . IR [place] TUBE CHANGE  06/19/2018  . IR [place] TUBE CHANGE  03/03/2019  . IR [place] TUBE CHANGE  06/06/2019  . IR PATIENT EVAL TECH 0-60 MINS  10/19/2016  . IR PATIENT EVAL TECH 0-60 MINS  12/25/2016  . IR PATIENT EVAL TECH 0-60 MINS  01/29/2017  . IR PATIENT EVAL TECH 0-60 MINS  04/04/2017  . IR PATIENT EVAL TECH 0-60 MINS  04/30/2017  . IR PATIENT EVAL TECH 0-60 MINS  07/31/2017  . IR REPLC DUODEN/JEJUNO TUBE PERCUT W/FLUORO  11/14/2016  . LAPAROTOMY N/A 01/20/2015   Procedure: EXPLORATORY LAPAROTOMY;  Surgeon: Coralie Keens, MD;  Location: [place];  Service: General;  Laterality: N/A;  . LOWER EXTREMITY ANGIOGRAPHY Left 09/21/2016   Procedure: Lower Extremity Angiography;  Surgeon: Waynetta Sandy, MD;  Location: [place] [facility];  Service: Cardiovascular;  Laterality: Left;  . LOWER EXTREMITY ANGIOGRAPHY Right 06/27/2017   Procedure: Lower Extremity Angiography;  Surgeon: Waynetta Sandy, MD;  Location: [facility] [facility];  Service: Cardiovascular;  Laterality: Right;  . LYSIS OF ADHESION N/A 01/20/2015   Procedure: LYSIS OF ADHESIONS < 1 HOUR;  Surgeon: Coralie Keens, MD;  Location: [facility];  Service: General;  Laterality: N/A;  . PERIPHERAL VASCULAR BALLOON ANGIOPLASTY Left 09/21/2016   Procedure: Peripheral Vascular Balloon Angioplasty;  Surgeon: Waynetta Sandy, MD;  Location: [facility] [facility];  Service: Cardiovascular;  Laterality: Left;  drug coated balloon  . PERIPHERAL VASCULAR BALLOON ANGIOPLASTY Right 06/27/2017   Procedure: PERIPHERAL VASCULAR BALLOON ANGIOPLASTY;  Surgeon: Waynetta Sandy, MD;  Location: [facility];  Service: Cardiovascular;  Laterality: Right;  SFA/TPTRUNK  . TONSILLECTOMY    . TUBAL LIGATION    . VENTRAL HERNIA REPAIR  2015   incarcerated ventral hernia ([facility] 09/2013)/notes 01/10/2015   Social History:   reports that she has never smoked. She quit smokeless tobacco use about 39 years ago.  Her smokeless tobacco use included snuff. She reports that she does not drink alcohol or use drugs.  Family History  Problem Relation Age of Onset  . Stroke Mother   . Hypertension  Mother   . Diabetes Brother   . Glaucoma Son   . Glaucoma Son   . Heart attack Neg Hx   . Colon cancer Neg Hx   . Stomach cancer Neg Hx     Medications: Patient's Medications  New Prescriptions   No medications on file  Previous Medications   ACETAMINOPHEN (TYLENOL) 500 MG TABLET    Take 500 mg by mouth every 6 (six) hours as needed (pain).    AMINO ACIDS-PROTEIN HYDROLYS (FEEDING SUPPLEMENT, PRO-STAT SUGAR FREE 64,) LIQD    Take 30 mLs by mouth 2 (two) times daily.   AMLODIPINE (NORVASC) 5 MG TABLET    Take 1 tablet (5 mg total) by mouth daily.   ATORVASTATIN (LIPITOR) 10 MG TABLET    TAKE 1 TABLET BY MOUTH EVERY DAY   B COMPLEX VITAMINS TABLET    Take 1 tablet by mouth daily.    CALCIUM-VITAMIN D (OSCAL WITH D) 500-200 MG-UNIT PER TABLET    Take 1 tablet by mouth daily with breakfast.    CARBOXYMETHYLCELLULOSE SODIUM (ARTIFICIAL TEARS OP)    Place 1 drop into both eyes 4 (four) times daily.   CYCLOSPORINE (RESTASIS) 0.05 % OPHTHALMIC EMULSION    USE 1 DROP INTO BOTH EYES TWICE DAILY   DULERA 200-5 MCG/ACT AERO    TAKE TWO PUFFS BY MOUTH TWICE DAILY   FEEDING SUPPLEMENT (OSMOLITE 1 CAL) LIQD    Take 711 mLs by mouth See admin instructions. For 12 hours 2100 - 0900   FERROUS SULFATE 220 (44 FE) MG/5ML SOLUTION    Take 7.4 mLs (325 mg total) by mouth daily.   FLUTICASONE (FLONASE) 50 MCG/ACT NASAL SPRAY    SPRAY 2 SPRAYS INTO EACH NOSTRIL EVERY DAY   FUROSEMIDE (LASIX) 8 MG/ML SOLUTION    20 mg  (2.5 ml) daily unless swelling occurs take 40 mg (5 ml) and then switch back to 20 mg daily   GABAPENTIN (NEURONTIN) 250 MG/5ML SOLUTION    Take 6 mLs (300 mg total) by mouth 2 (two) times daily.   GENTAMICIN CREAM (GARAMYCIN) 0.1 %    Apply 1 application topically 3 (three) times daily as needed (dry skin).   HYDRALAZINE (APRESOLINE) 50 MG TABLET    TAKE 1 TABLET BY MOUTH THREE TIMES A DAY   INSULIN ASPART (NOVOLOG) 100 UNIT/ML FLEXPEN    Inject 0-6 Units into the skin 3 (three) times daily with meals. New sliding Scale If BS is 151 - 251 = 2 units,  If BS is 251 -  350 = 4 units,  If BS is 351 - 450 = 6 units Call provider if BS runs more than 400   INSULIN GLARGINE (LANTUS SOLOSTAR) 100 UNIT/ML SOLOSTAR PEN    Inject 8 Units into the skin at bedtime.   INSULIN PEN NEEDLE (B-D UF III MINI PEN NEEDLES) 31G X 5 MM MISC    Use twice daily with giving insulin injections. Dx E11.9   IPRATROPIUM-ALBUTEROL (DUONEB) 0.5-2.5 (3) MG/3ML SOLN    Take 3 mLs by nebulization every 6 (six) hours as needed (sob). Use 3 times daily x 4 days then every 6 hours as needed.   ISOSORBIDE MONONITRATE (IMDUR) 30 MG 24 HR TABLET    TAKE 2 TABLETS BY MOUTH DAILY. PLEASE MAKE ANNUAL APPT WITH DR. Meda Coffee FOR FUTURE REFILLS. THANK YOU. 1ST ATTEMPT.   LATANOPROST (XALATAN) 0.005 % OPHTHALMIC SOLUTION    Place 1 drop into both eyes at bedtime.   LEVALBUTEROL (XOPENEX HFA) 45  MCG/ACT INHALER    Inhale 2 puffs into the lungs every 6 (six) hours as needed for wheezing or shortness of breath.   LEVOTHYROXINE (SYNTHROID) 125 MCG TABLET    TAKE 1 TABLET BY MOUTH EVERY DAY   LORATADINE (CLARITIN) 10 MG TABLET    Take 10 mg by mouth daily as needed for allergies.   MULTIPLE VITAMIN (MULTIVITAMIN WITH MINERALS) TABS TABLET    Take 1 tablet by mouth daily.   NYSTATIN (MYCOSTATIN/NYSTOP) POWDER    Apply 1 g topically as needed (skin irritation).    OXYCODONE (ROXICODONE) 5 MG IMMEDIATE RELEASE TABLET    Take 1 tablet (5 mg total) by mouth  every 4 (four) hours as needed for severe pain. May take 1/2 tab q4hrs as needed for mild pain.   PANTOPRAZOLE (PROTONIX) 40 MG TABLET    Take 1 tablet (40 mg total) by mouth daily.   PROBIOTIC PRODUCT (EQL PROBIOTIC COLON SUPPORT PO)    Take 1 capsule by mouth daily.   SERTRALINE (ZOLOFT) 20 MG/ML CONCENTRATED SOLUTION    Take 2.5 mLs (50 mg total) by mouth daily.   WARFARIN (COUMADIN) 2.5 MG TABLET    Take 2-3 tablets by mouth daily as directed by Coumadin clinic.   WATER FOR IRRIGATION, STERILE (FREE WATER) SOLN    Place 180 mLs into feeding tube 3 (three) times daily.    WHITE PETROLATUM-MINERAL OIL (SYSTANE NIGHTTIME) OINT    Place 1 application into both eyes at bedtime.   Modified Medications   No medications on file  Discontinued Medications   No medications on file    Physical Exam:  Vitals:   07/18/19 1316  BP: (!) 146/68  Pulse: 64  Temp: (!) 97.3 F (36.3 C)  TempSrc: Temporal  SpO2: 99%  Weight: 139 lb (63 kg)  Height: 5\' 6"  (1.676 m)   Body mass index is 22.44 kg/m. Wt Readings from Last 3 Encounters:  07/18/19 139 lb (63 kg)  06/11/19 138 lb (62.6 kg)  05/21/19 140 lb 12 oz (63.8 kg)    Physical Exam Constitutional:      General: She is not in acute distress.    Appearance: She is well-developed. She is not diaphoretic.  HENT:     Head: Normocephalic and atraumatic.     Mouth/Throat:     Pharynx: No oropharyngeal exudate.  Eyes:     Conjunctiva/sclera: Conjunctivae normal.     Pupils: Pupils are equal, round, and reactive to light.  Cardiovascular:     Rate and Rhythm: Normal rate and regular rhythm.     Heart sounds: Normal heart sounds.  Pulmonary:     Effort: Pulmonary effort is normal.     Breath sounds: Normal breath sounds.  Abdominal:     General: Bowel sounds are normal.     Palpations: Abdomen is soft.     Tenderness: There is abdominal tenderness (around jtube site).  Musculoskeletal:        General: No tenderness.     Cervical back:  Normal range of motion and neck supple.  Skin:    General: Skin is warm and dry.  Neurological:     Mental Status: She is alert and oriented to person, place, and time. Mental status is at baseline.     Labs reviewed: Basic Metabolic Panel: Recent Labs    10/15/18 1122 11/24/18 2036 05/18/19 1925 05/18/19 2355 05/27/19 1213 07/07/19 0122 07/07/19 0123 07/08/19 0539  NA 138   < > 134*   < >  135 133*  --  136  K 4.7   < > 4.3   < > 4.6 4.2  --  4.5  CL 101   < > 101   < > 100 99  --  106  CO2 31   < > 24   < > 28 24  --  24  GLUCOSE 100*   < > 80   < > 80 108*  --  149*  BUN 68*   < > 60*   < > 58* 64*  --  50*  CREATININE 1.57*   < > 1.75*   < > 1.49* 1.69*  --  1.27*  CALCIUM 10.1   < > 9.4   < > 9.8 9.6  --  8.9  MG  --   --  2.2  --   --   --   --   --   TSH 0.98  --   --   --   --   --  1.623  --    < > = values in this interval not displayed.   Liver Function Tests: Recent Labs    12/26/18 0533 12/26/18 0533 05/18/19 1925 05/27/19 1213 07/07/19 0122  AST 24   < > 28 24 30   ALT 19   < > 21 16 21   ALKPHOS 84  --  89  --  91  BILITOT 0.6   < > 0.7 0.4 0.5  PROT 7.4   < > 8.0 7.6 8.1  ALBUMIN 3.5  --  3.9  --  4.0   < > = values in this interval not displayed.   Recent Labs    08/27/18 1100 05/18/19 1925 07/07/19 0122  LIPASE 24 22 26   AMYLASE 60  --   --    No results for input(s): AMMONIA in the last 8760 hours. CBC: Recent Labs    03/01/19 2145 03/02/19 0242 05/27/19 1213 07/07/19 0122 07/08/19 0539  WBC 6.0   < > 4.5 5.5 3.7*  NEUTROABS 3.6  --  2,651 3.4  --   HGB 9.4*   < > 9.9* 10.0* 8.9*  HCT 29.1*   < > 30.6* 29.9* 27.6*  MCV 93.9   < > 93.6 93.7 96.8  PLT 228   < > 235 219 185   < > = values in this interval not displayed.   Lipid Panel: Recent Labs    10/15/18 1122  CHOL 131  HDL 47*  LDLCALC 58  TRIG 188*  CHOLHDL 2.8   TSH: Recent Labs    10/15/18 1122 07/07/19 0123  TSH 0.98 1.623   A1C: Lab Results  Component  Value Date   HGBA1C 6.2 (H) 03/02/2019     Assessment/Plan 1. DNR (do not resuscitate) - DNR (Do Not Resuscitate)  2. Aspiration pneumonia of left lower lobe, unspecified aspiration pneumonia type ([place]) -altered mental status presumed from aspiration pneumonia. She has completed course of antibiotics and back to baseline. Will follow up labs today.  - CBC with Differential/Platelet - BASIC METABOLIC PANEL WITH GFR  3. Gastroesophageal reflux disease without esophagitis protonix is no longer covered on drug plan. Daughter   4. Seasonal allergies Worse at this time. Continue antihistamine with nasal spray. Encouraged saline wash to bilateral nares and to avoid forcefully blowing nose.   5. PEG tube malfunction ([place]) -never got a RD to reach out to them however no longer having the overflow issue and weight has been stable. To notify  if this becomes an issue again.   6. COPD with asthma ([place]) -stable at this time. Will continue current regimen  Next appt: 10/10/2019 as scheduled Coleen Cardiff K. Rondo, [facility] [phone]

## 2019-07-19 LAB — BASIC METABOLIC PANEL WITH GFR
BUN/Creatinine Ratio: 39 (calc) — ABNORMAL HIGH (ref 6–22)
BUN: 56 mg/dL — ABNORMAL HIGH (ref 7–25)
CO2: 26 mmol/L (ref 20–32)
Calcium: 9.6 mg/dL (ref 8.6–10.4)
Chloride: 101 mmol/L (ref 98–110)
Creat: 1.44 mg/dL — ABNORMAL HIGH (ref 0.60–0.88)
GFR, Est African American: 36 mL/min/{1.73_m2} — ABNORMAL LOW (ref >=60)
GFR, Est Non African American: 31 mL/min/{1.73_m2} — ABNORMAL LOW (ref >=60)
Glucose, Bld: 102 mg/dL — ABNORMAL HIGH (ref 65–99)
Potassium: 4.5 mmol/L (ref 3.5–5.3)
Sodium: 136 mmol/L (ref 135–146)

## 2019-07-19 LAB — CBC WITH DIFFERENTIAL/PLATELET
Absolute Monocytes: 697 cells/uL (ref 200–950)
Basophils Absolute: 59 cells/uL (ref 0–200)
Basophils Relative: 1.1 %
Eosinophils Absolute: 308 cells/uL (ref 15–500)
Eosinophils Relative: 5.7 %
HCT: 29.4 % — ABNORMAL LOW (ref 35.0–45.0)
Hemoglobin: 9.5 g/dL — ABNORMAL LOW (ref 11.7–15.5)
Lymphs Abs: 961 cells/uL (ref 850–3900)
MCH: 31.1 pg (ref 27.0–33.0)
MCHC: 32.3 g/dL (ref 32.0–36.0)
MCV: 96.4 fL (ref 80.0–100.0)
MPV: 12 fL (ref 7.5–12.5)
Monocytes Relative: 12.9 %
Neutro Abs: 3375 cells/uL (ref 1500–7800)
Neutrophils Relative %: 62.5 %
Platelets: 235 10*3/uL (ref 140–400)
RBC: 3.05 10*6/uL — ABNORMAL LOW (ref 3.80–5.10)
RDW: 15.2 % — ABNORMAL HIGH (ref 11.0–15.0)
Total Lymphocyte: 17.8 %
WBC: 5.4 10*3/uL (ref 3.8–10.8)

## 2019-07-21 ENCOUNTER — Other Ambulatory Visit: Payer: Self-pay | Admitting: *Deleted

## 2019-07-21 NOTE — Patient Outreach (Signed)
[facility]) Care Management  07/21/2019  Shannon [name] July 17, 1923 [id number]   Referral Date:n4/7 Referral Source: Hospital liaison Referral Reason: post discharge follow up Insurance: [place] attempt #3, successful, identity verified.  This care manager introduced self and stated purpose of call.  [facility] care management services explained.   Social: Lives with daughter who provides support and assistance with ADLs.  Member report she is able to do some things on her own but does allow daughter to help when needed.  Report she was having leaking around her PEG tube, was seen by radiologist and problem is much better now. State she was in the hospital for suspected aspiration pneumonia, feel she has improved.  Conditions: Per chart, has history of HTN, CHF, A-fib, PVD, recurrent aspiration pneumonia, COPD, DM, Hypothyroidism, CKD, and dyslipidemia.  Report she does monitor her weights and blood sugars daily.  Today's readings were 133 pounds (daughter will recheck as she was 139 last week at MD visit) and CBG was 179.  State with is usually better, was 58 yesterday.  Medications: Reviewed with member and daughter.  Confirms she is taking antibiotics for pneumonia as well as a new medication for GERD.  Appointments: Was seen by PCP on 4/16, next visit in 3 months.  Daughter provide transportation to MD visits.  Plan: RN CM will follow up with member and/or daughter within the next 2 weeks.  Fall Risk  07/21/2019 07/18/2019 05/06/2019 03/17/2019 03/13/2019  Falls in the past year? 0 0 0 0 0  Number falls in past yr: 0 0 0 0 0  Injury with Fall? 0 0 0 0 0  Risk for fall due to : - - - - -  Follow up - - - Falls evaluation completed -   Depression screen [facility] 2/9 07/21/2019 10/10/2018 08/07/2018 10/05/2017  Decreased Interest 0 0 0 0  Down, Depressed, Hopeless 0 0 0 0  PHQ - 2 Score 0 0 0 0  Some recent data might be hidden    [facility] CM Care Plan Problem One     Most  Recent Value  Care Plan Problem One  Risk for admission related to infection as evidenced by recent hospitalization  Role Documenting the Problem One  Care Management [place] for Problem One  Active  [name] Long Term Goal   Member will not be readmitted to hospital within the next 31 days  [name] Long Term Goal Start Date  07/21/19  Interventions for Problem One Long Term Goal  Discharge instructions reviewed with member and daughter.  Educated on signs/symptoms of further infection.  [name] CM Short Term Goal #1   Member will report taking all medications as prescribed over the next 2 weeks  [name] CM Short Term Goal #1 Start Date  07/21/19  Interventions for Short Term Goal #1  Medications reviewed with member, educated on importance of complieting course of antibiotics in effort to treat pneumonia  [name] CM Short Term Goal #2   Member will report no decrease in weight greater than 3 pounds over the next 4 weeks  [name] CM Short Term Goal #2 Start Date  07/21/19  Interventions for Short Term Goal #2  Discussed nutrition with member and daughter.  Encouraged to try soft meals with increased calories in effort to stop weight loss.  Confirmed daughter is provided supplemental feedings through tube.     [name], [place], MSN [place] [phone]

## 2019-07-23 NOTE — Progress Notes (Signed)
Subjective:  86 y.o. female with PMHx of diabetes mellitus presenting today for follow up evaluation of ulcerations of the right foot. She states she is doing well and improving. She has been using the Gentamicin cream as instructed. She denies any worsening factors. Patient is here for further evaluation and treatment.   Past Medical History:  Diagnosis Date  . Acute respiratory failure ([place])   . Anemia    previous blood transfusions  . Arthritis    "all over"  . Asthma   . Atrial flutter ([place])   . Bradycardia    requiring previous d/c of BB and reduction of amiodarone  . CAD (coronary artery disease)    nonobstructive per notes  . Chronic diastolic CHF (congestive heart failure) ([place])   . CKD (chronic kidney disease), stage III   . Complication of blood transfusion    "got the wrong blood type at [facility] in ~ 2015; no adverse reaction that we are aware of"/daughter, [name] (01/27/2016)  . COPD (chronic obstructive pulmonary disease) ([place])   . Depression    "light case"  . DVT (deep venous thrombosis) ([place]) 01/2016   a. LLE DVT 01/2016 - switched from Eliquis to Coumadin.  [name] Dyspnea    with some activity  . Gastric stenosis    a. s/p stomach tube  . GERD (gastroesophageal reflux disease)   . History of blood transfusion    "several" (01/27/2016)  . History of stomach ulcers   . Hyperlipidemia   . Hypertension   . Hypothyroidism   . On home oxygen therapy    "2L; 9PM til 9AM" (06/27/2017)  . PAD (peripheral artery disease) ([place])    a. prior gangrene, toe amputations, intervention.  [name] PAF (paroxysmal atrial fibrillation) ([place])   . Paraesophageal hernia   . Perforated gastric ulcer ([place])   . Pneumonia    "a few times" (06/27/2017)  . Seasonal allergies   . SIADH (syndrome of inappropriate ADH production) ([place])    [name] 01/10/2015  . Small bowel obstruction ([place])    "I don't know how many" (01/11/2015)  . Stroke [facility])    several- no residual  . Type II diabetes  mellitus ([place])    "related to prednisone use  for > 20 yr; once predinose stopped; no more DM RX" (01/27/2016)  . UTI (urinary tract infection) 02/08/2016  . Ventral hernia with bowel obstruction       Objective/Physical Exam General: The patient is alert and oriented x3 in no acute distress.  Dermatology:  Wounds noted to 2nd and 3rd digits of the right foot, both measuring 0.5 x 0.5 x 0.1 cm (LxWxD).   To the noted ulceration(s), there is no eschar. There is a moderate amount of slough, fibrin, and necrotic tissue noted. Granulation tissue and wound base is red. There is a minimal amount of serosanguineous drainage noted. There is no exposed bone muscle-tendon ligament or joint. There is no malodor. Periwound integrity is intact. Skin is warm, dry and supple bilateral lower extremities.  Vascular: Palpable pedal pulses bilaterally. No edema or erythema noted. Capillary refill within normal limits.  Neurological: Epicritic and protective threshold diminished bilaterally.   Musculoskeletal Exam: Range of motion within normal limits to all pedal and ankle joints bilateral. Muscle strength 5/5 in all groups bilateral.   Assessment: 1. Ulcerations noted to digits 2 and 3 secondary to diabetes mellitus 2. diabetes mellitus w/ peripheral neuropathy   Plan of Care:  1. Patient was evaluated. 2. Medically necessary  excisional debridement including subcutaneous tissue was performed using a tissue nipper and a chisel blade. Excisional debridement of all the necrotic nonviable tissue down to healthy bleeding viable tissue was performed with post-debridement measurements same as pre-. 3. The wound was cleansed and dry sterile dressing applied. 4. Continue using Gentamicin cream.  5. Return to clinic in 6 weeks.    Edrick Kins, DPM [facility]  Dr. Edrick Kins, [place]                                        [place], [place] [address]                 Office [phone]  Fax [phone]

## 2019-07-24 IMAGING — XA IR REPLACE G/J TUBE W/ FLUORO
2 series · 5 of 5 positions shown · non-contrast
Comparison: none

INDICATION: [AGE] female with a history of a percutaneous gastro
jejunostomy tube which has recently become displaced. Additionally,
she has cellulitis of the soft tissue tract.

[Series 1: fl - angio · 4 of 35 frames shown]
[frame 6/35]
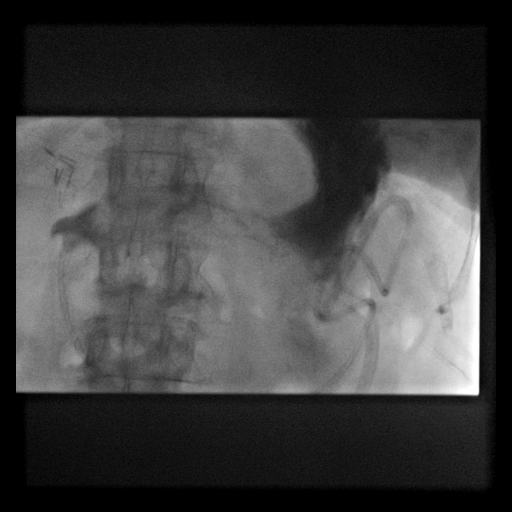
[frame 9/35]
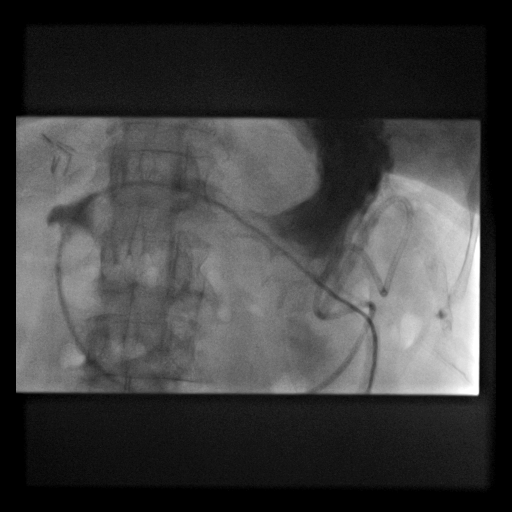
[frame 18/35]
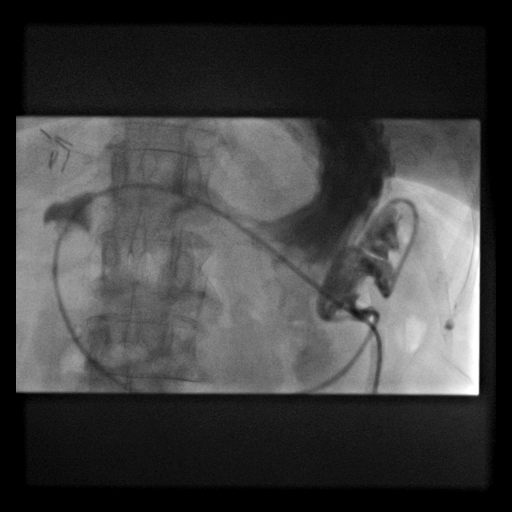
[frame 30/35]
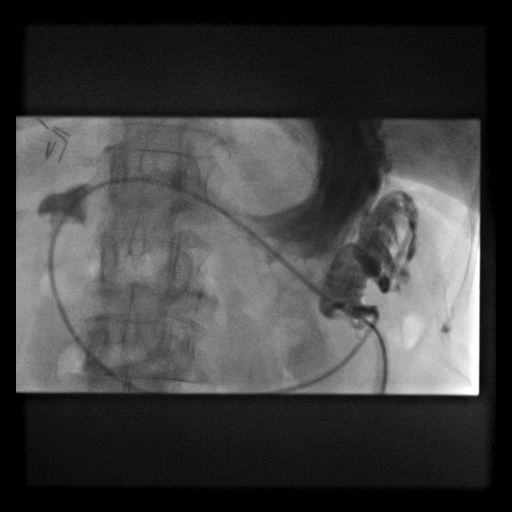

[Series 300: ir gastric tube perc chg w/o img guide · non-contrast · 1 of 1 slices shown]
[im 1/1]
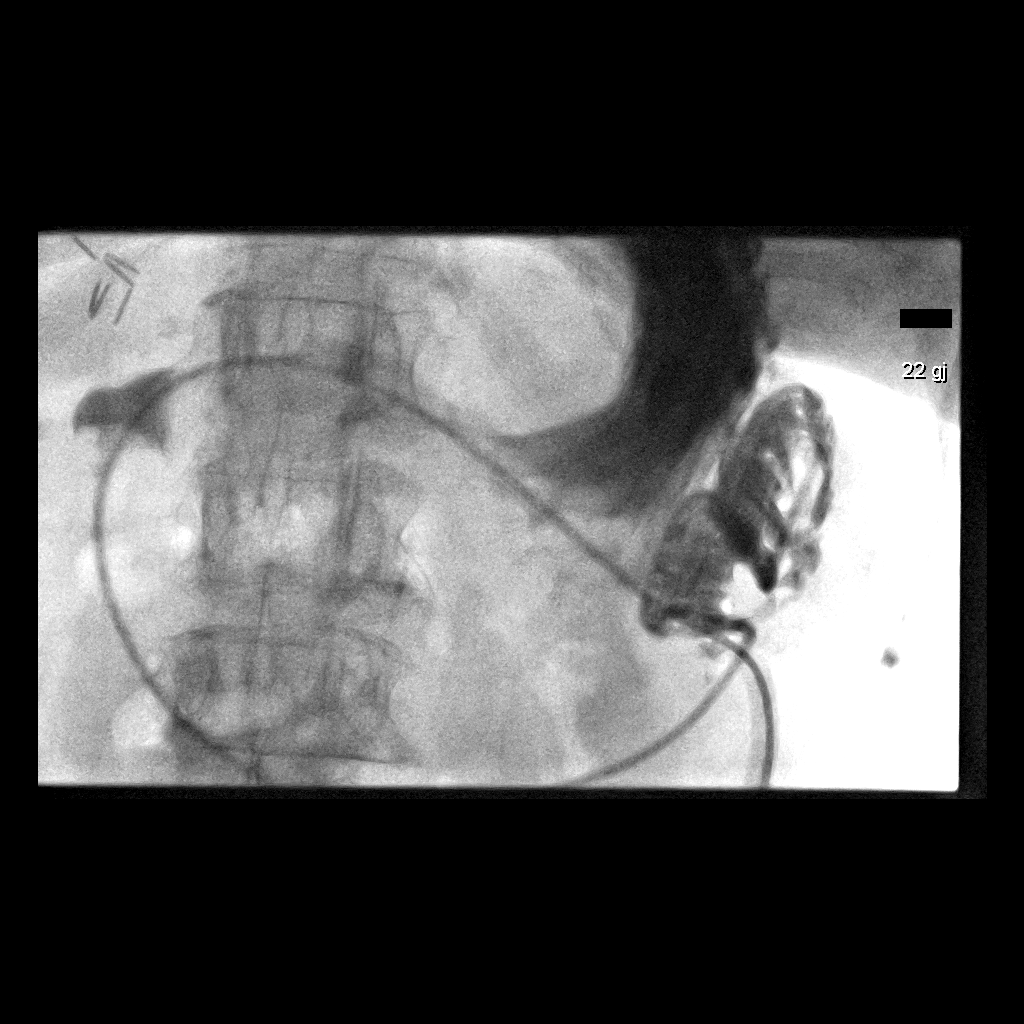

[5 of 5 positions shown; findings below may reference images not displayed]

EXAM:
JEJUNAL CATHETER REPLACEMENT

MEDICATIONS:
None.

ANESTHESIA/SEDATION:
None.

CONTRAST:  15mL 85LJFS-VPP IOPAMIDOL (85LJFS-VPP) INJECTION 61%,
15mL 85LJFS-VPP IOPAMIDOL (85LJFS-VPP) INJECTION 61% - administered
into the gastric lumen.

FLUOROSCOPY TIME:  Fluoroscopy Time: 3 minutes 54 seconds (35 mGy).

COMPLICATIONS:
None immediate.

PROCEDURE:
Informed written consent was obtained from the patient after a
thorough discussion of the procedural risks, benefits and
alternatives. All questions were addressed. Maximal Sterile Barrier
Technique was utilized including caps, mask, sterile gowns, sterile
gloves, sterile drape, hand hygiene and skin antiseptic. A timeout
was performed prior to the initiation of the procedure.

An angled catheter and hydrophilic wire were navigated through the
soft tissue tract and into the stomach. Contrast material was
injected demonstrating expected postsurgical changes of the stomach.
There is no evidence of fistulization with adjacent colon or small
bowel. The catheter was successfully navigated into the duodenum,
through the duodenum and into the proximal jejunum.

The hydrophilic wire was removed and replaced with a stiff Amplatz
wire. A 5 French catheter was removed. A new 22 French gastro
jejunostomy tube was then advanced over the wire and positioned with
the distal tip in the proximal jejunum. This was consist firm to by
contrast injection under fluoroscopy. An image was saved. The
catheter was then flushed with saline. The retention balloon was
inflated with 9 mL water and pulled tight against the anterior
abdominal wall.
IMPRESSION: Successful replacement of a new 22 French gastrojejunostomy tube.
The J arm tip terminates in the proximal small bowel and is ready
for immediate use.

## 2019-07-24 IMAGING — CT CT ABD-PELV W/O CM
2 of 8 series · 15 of 46 positions shown, 17 images · non-contrast
Comparison: Abdominal radiographs 01/05/2017

CLINICAL DATA: Leaking gastrostomy tube with foul odor for a few
weeks. Tube came out today. Solid stool coming from the site.
History of diabetes and hypertension.

EXAM:
CT ABDOMEN AND PELVIS WITHOUT CONTRAST
TECHNIQUE: Multidetector CT imaging of the abdomen and pelvis was performed
following the standard protocol without IV contrast.

[Series 2: abd/pel w/o · axial · non-contrast · 0.71mm/px · z∈[-410,-65]mm · 12 of 83 slices shown, 14 images]
[im 7/83  soft-tissue]
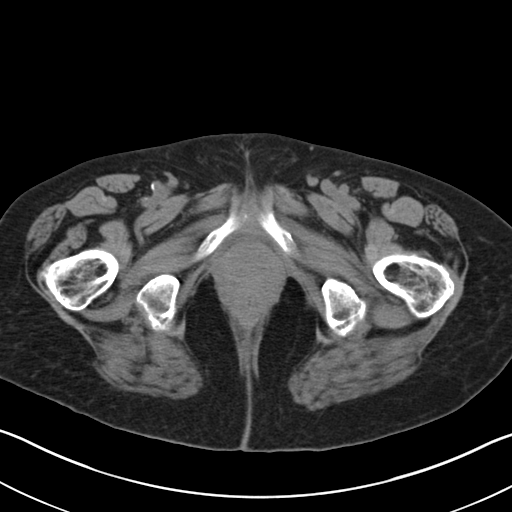
[im 7/83  bone]
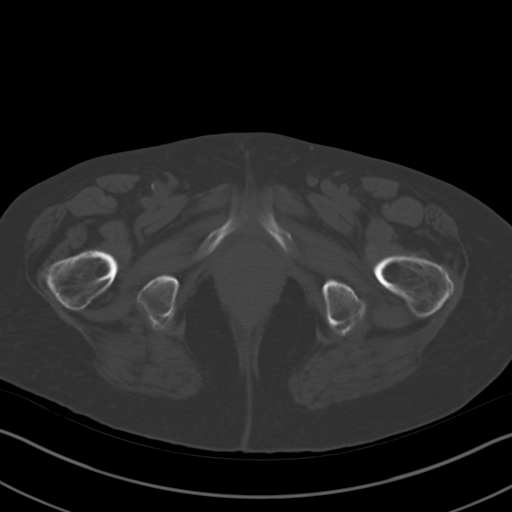
[im 13/83  soft-tissue]
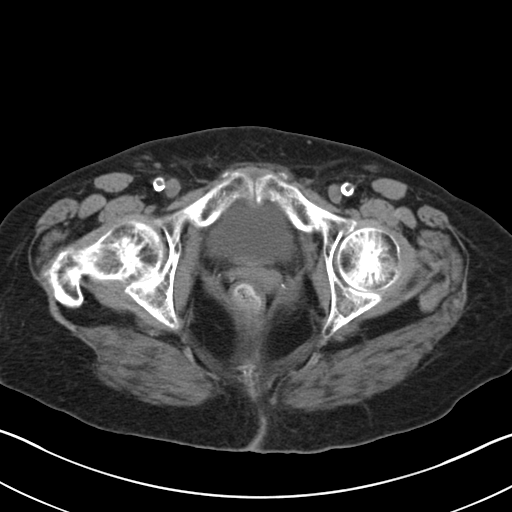
[im 19/83  soft-tissue]
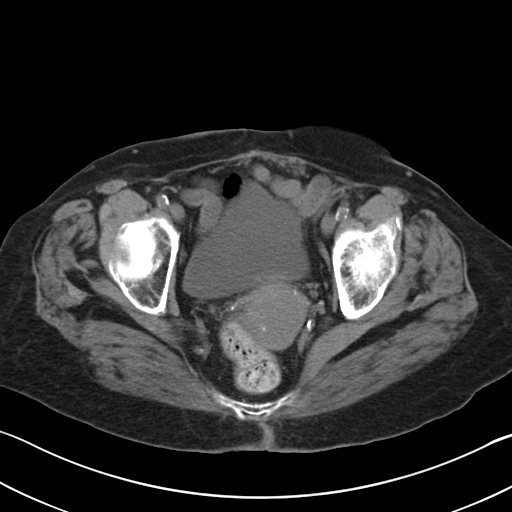
[im 26/83  soft-tissue]
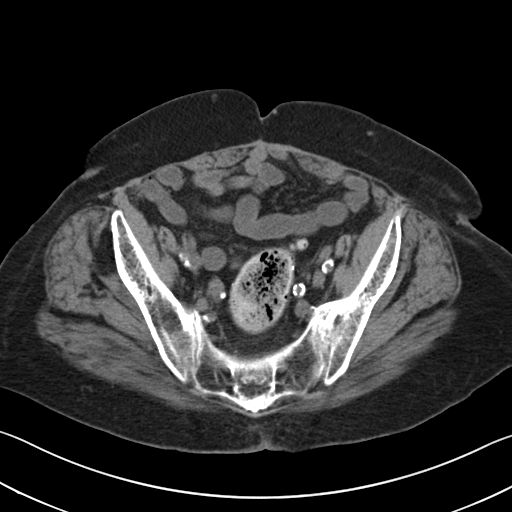
[im 32/83  soft-tissue]
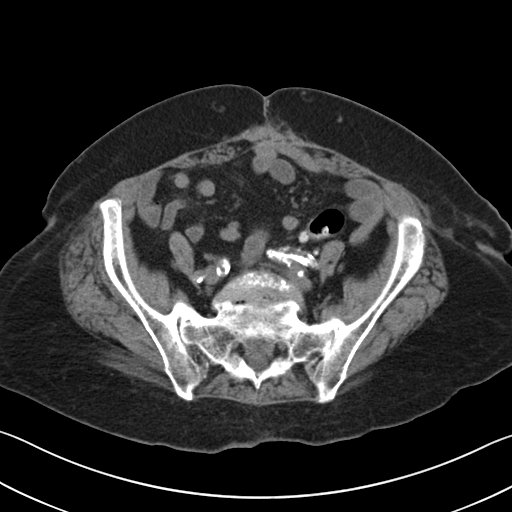
[im 38/83  soft-tissue]
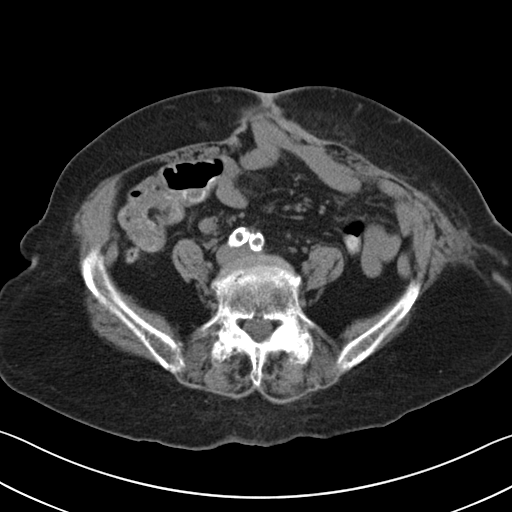
[im 45/83  soft-tissue]
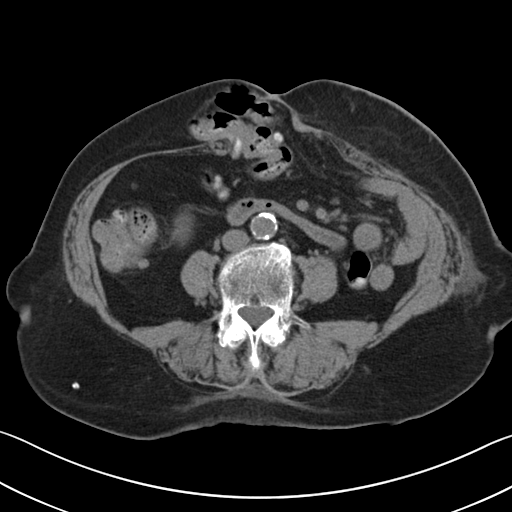
[im 51/83  soft-tissue]
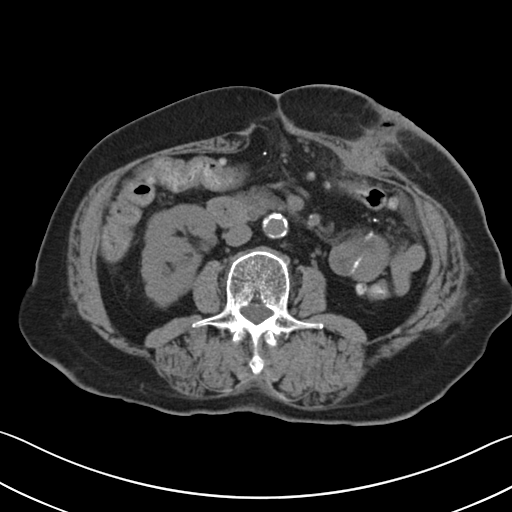
[im 57/83  soft-tissue]
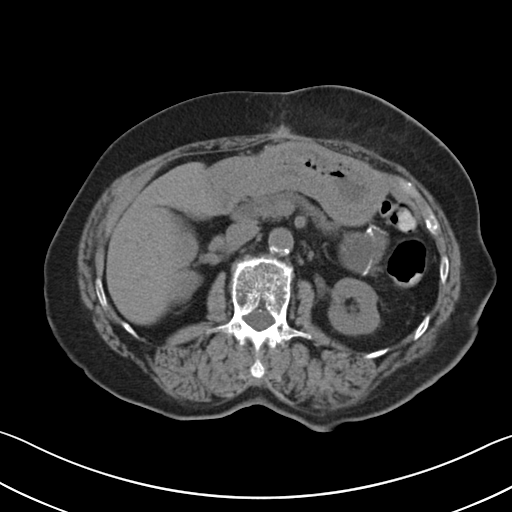
[im 57/83  bone]
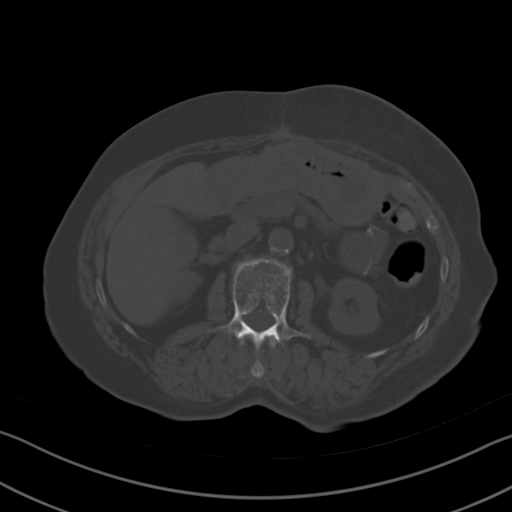
[im 64/83  soft-tissue]
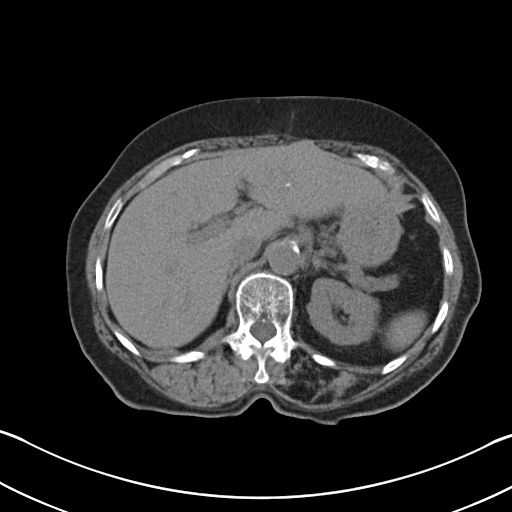
[im 70/83  soft-tissue]
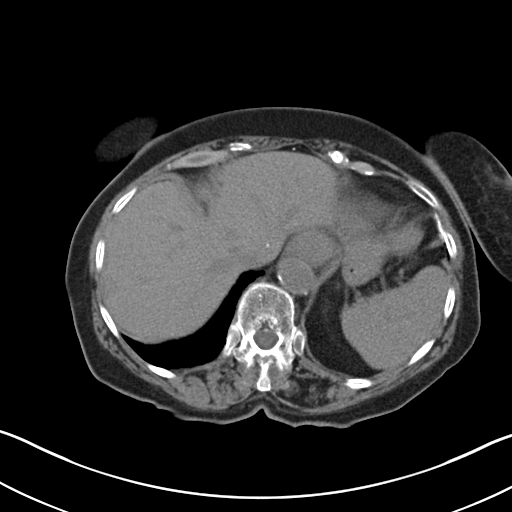
[im 76/83  soft-tissue]
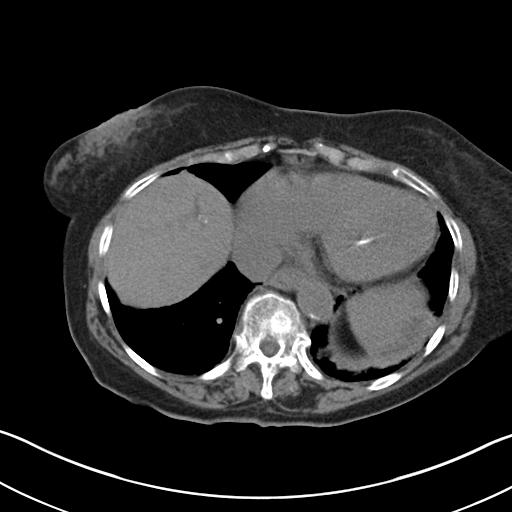

[Series 6: coronal · coronal · 0.70mm/px · 3 of 127 slices shown]
[im 32/127  soft-tissue]
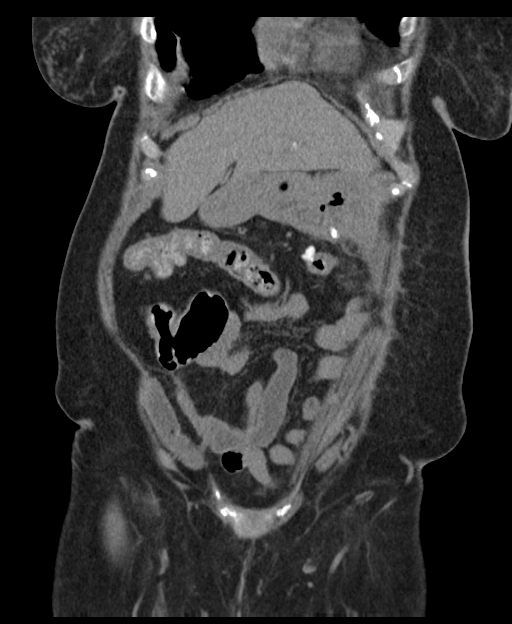
[im 64/127  soft-tissue]
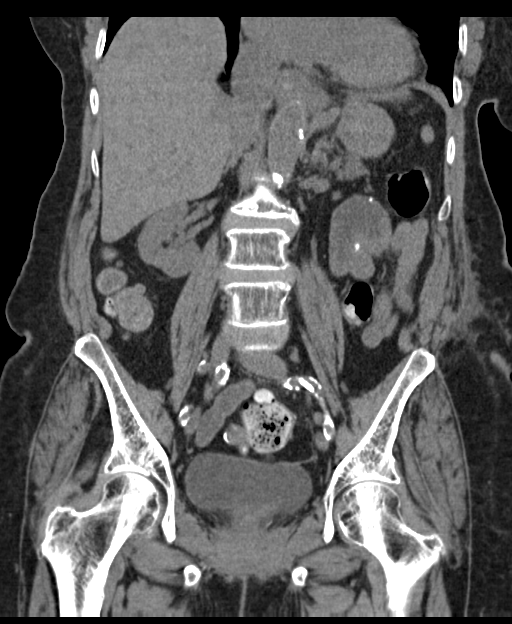
[im 95/127  soft-tissue]
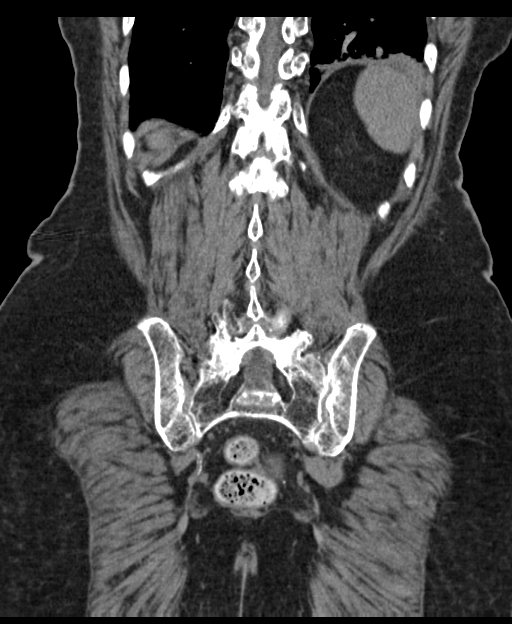

[15 of 46 positions shown; findings below may reference images not displayed]

FINDINGS: Lower chest: Cardiac enlargement. Aortic and coronary artery
calcifications. Atelectasis or consolidation in the left lung base.
This could represent pneumonia in the appropriate clinical setting.

Hepatobiliary: Surgical absence of the gallbladder. No bile duct
dilatation. Multiple low-attenuation lesions scattered throughout
the liver. Largest is in segment 6 and measures about 8 mm in
diameter. Liver lesions are indeterminate on noncontrast imaging. If
there is history of malignancy, consider MRI for further
characterization.

Pancreas: Unremarkable. No pancreatic ductal dilatation or
surrounding inflammatory changes.

Spleen: Normal in size without focal abnormality.

Adrenals/Urinary Tract: Adrenal glands are unremarkable. Kidneys are
normal, without renal calculi, focal lesion, or hydronephrosis.
Bladder is unremarkable.

Stomach/Bowel: Diffusely stool-filled colon. Colonic diverticulosis
without evidence of diverticulitis. Multiple small bowel diverticula
are also present. Stomach, small bowel, and colon are not abnormally
distended. Appendix is not identified. Large ventral abdominal wall
hernia containing transverse colon and small bowel without evidence
of proximal obstruction. Surgical clips along the greater curvature
of the stomach.

Vascular/Lymphatic: Aortic atherosclerosis. No enlarged abdominal or
pelvic lymph nodes.

Reproductive: Uterus and bilateral adnexa are unremarkable.

Other: Anterior abdominal wall hernias containing colon, small
bowel, and fat. Infiltration and gas along the left upper quadrant
anterior abdominal wall extending to the greater curvature of the
stomach likely representing the tract from previous gastrostomy
tube. No discrete fluid collection to suggest an abscess. No free
air or free fluid in the abdomen.

Musculoskeletal: Degenerative changes in the spine. Old anterior
compression of T11. No destructive bone lesions.
IMPRESSION: 1. Possible pneumonia versus atelectasis in the left lung base.
2. Multiple low-attenuation lesions throughout the liver are
indeterminate on noncontrast imaging. If there is history of
malignancy, consider MRI for further characterization.
3. Diverticulosis of the colon and small bowel. No inflammatory
changes identified.
4. Large ventral abdominal wall hernia containing transverse colon
and small bowel without evidence of proximal obstruction.
5. Infiltration and gas along the left upper quadrant anterior
abdominal wall representing the previous gastrostomy tube tract. No
definite abscess.
6. Aortic atherosclerosis.

## 2019-08-01 ENCOUNTER — Other Ambulatory Visit: Payer: Self-pay

## 2019-08-01 ENCOUNTER — Ambulatory Visit (INDEPENDENT_AMBULATORY_CARE_PROVIDER_SITE_OTHER): Payer: Medicare Other | Admitting: *Deleted

## 2019-08-01 DIAGNOSIS — Z5181 Encounter for therapeutic drug level monitoring: Secondary | ICD-10-CM | POA: Diagnosis not present

## 2019-08-01 DIAGNOSIS — I4892 Unspecified atrial flutter: Secondary | ICD-10-CM

## 2019-08-01 DIAGNOSIS — I4891 Unspecified atrial fibrillation: Secondary | ICD-10-CM

## 2019-08-01 LAB — POCT INR: INR: 2.7 (ref 2.0–3.0)

## 2019-08-01 NOTE — Patient Instructions (Addendum)
Description   Continue on same dosage 2 tablets daily except 3 tablets on Sundays and Thursdays. Recheck in 4 weeks. Call [facility] with any new medications [phone].

## 2019-08-04 ENCOUNTER — Other Ambulatory Visit: Payer: Self-pay | Admitting: Cardiology

## 2019-08-05 ENCOUNTER — Other Ambulatory Visit: Payer: Self-pay | Admitting: *Deleted

## 2019-08-05 NOTE — Patient Outreach (Signed)
[place] [facility]) Care Management  08/05/2019  DYESHA HENAULT Dec 25, 1923 [id number]   Call placed to member to complete initial assessment.  Female answering phone report member nor her daughter [name] are available.  Request made for a call back, will await.  If no call back, will follow up within the next 3-4 business days.  [name], [place], MSN [place] [phone]

## 2019-08-08 ENCOUNTER — Other Ambulatory Visit: Payer: Self-pay | Admitting: *Deleted

## 2019-08-08 NOTE — Patient Outreach (Signed)
[place] [facility]) Care Management  08/08/2019  UNA YEOMANS 23-Oct-1923 [id number]   Call placed to member/daughter, daughter report she is on her way out to he store, request made to contact this care manager once she returns.  Will await call back, if no call back will follow up within the next 3-4 business days.  [name], [place], MSN [place] [phone]

## 2019-08-11 ENCOUNTER — Other Ambulatory Visit: Payer: Self-pay | Admitting: *Deleted

## 2019-08-11 MED ORDER — OXYCODONE HCL 5 MG PO TABS: 5.0000 mg | ORAL_TABLET | ORAL | 0 refills | Status: DC | PRN

## 2019-08-11 NOTE — Telephone Encounter (Signed)
Patient daughter [name] requested refill Parchment Verified LR: 06/14/2019 Pended Rx and sent to [facility] for approval.

## 2019-08-12 ENCOUNTER — Ambulatory Visit: Payer: Medicare Other | Admitting: Family

## 2019-08-14 ENCOUNTER — Other Ambulatory Visit: Payer: Self-pay | Admitting: *Deleted

## 2019-08-14 NOTE — Patient Outreach (Signed)
[facility] [facility]) Care Management  08/14/2019  Shannon [name] [birth year] [id number]   Outreach attempt #3, successful to daughter.  She report member's "just alright."  State she is still having issues with swallowing, feeling like something is stuck in her chest.  She have gotten as many medications as possible transitioned to liquid, using the feeding tube for other medications.  State this has helped a lot.  Denies contacting GI with concern, report they have been told repeatedly that there's nothing they can do for member's condition, which is a hernia.  Daughter does not understand how the hernia could cause the feeling in the chest.   Anatomy body discussed as well as complications of hernia, she now understands stating member does not want to have surgery.  Otherwise, report member is doing well.  She has appointment with cardiology on 6/9 and with PCP in July.  Denies any urgent concerns at this time, encouraged to contact this care manager with questions. Will follow up within the next month.  [name] CM Care Plan Problem One     Most Recent Value  Care Plan Problem One  Risk for admission related to infection as evidenced by recent hospitalization  Role Documenting the Problem One  Care Management [place] for Problem One  Active  [facility] Long Term Goal   Member will not be readmitted to hospital within the next 31 days  [name] Long Term Goal Start Date  07/21/19  [facility] for Problem One Long Term Goal  Discussed hernia complications with daughter, reminded of upcoming appointment with PCP and cardiology.  [name] CM Short Term Goal #1   Member will report taking all medications as prescribed over the next 2 weeks  [name] CM Short Term Goal #1 Start Date  07/21/19  [facility] CM Short Term Goal #1 Met Date  08/14/19  [name] CM Short Term Goal #2   Member will report no decrease in weight greater than 3 pounds over the next 4 weeks  [name] CM Short Term Goal #2 Start Date  07/21/19   [facility] CM Short Term Goal #2 Met Date  08/14/19     [name], RN, MSN [place] Manager [phone]

## 2019-08-15 ENCOUNTER — Other Ambulatory Visit: Payer: Self-pay

## 2019-08-15 ENCOUNTER — Emergency Department (HOSPITAL_COMMUNITY)
Admission: EM | Admit: 2019-08-15 | Discharge: 2019-08-15 | Disposition: A | Discharge status: Home / Self Care | Payer: Medicare Other | Attending: Emergency Medicine | Admitting: Emergency Medicine

## 2019-08-15 DIAGNOSIS — K219 Gastro-esophageal reflux disease without esophagitis: Secondary | ICD-10-CM | POA: Diagnosis not present

## 2019-08-15 DIAGNOSIS — I2510 Atherosclerotic heart disease of native coronary artery without angina pectoris: Secondary | ICD-10-CM | POA: Diagnosis not present

## 2019-08-15 DIAGNOSIS — Z79899 Other long term (current) drug therapy: Secondary | ICD-10-CM | POA: Insufficient documentation

## 2019-08-15 DIAGNOSIS — I959 Hypotension, unspecified: Secondary | ICD-10-CM | POA: Diagnosis not present

## 2019-08-15 DIAGNOSIS — N183 Chronic kidney disease, stage 3 unspecified: Secondary | ICD-10-CM | POA: Insufficient documentation

## 2019-08-15 DIAGNOSIS — I129 Hypertensive chronic kidney disease with stage 1 through stage 4 chronic kidney disease, or unspecified chronic kidney disease: Secondary | ICD-10-CM | POA: Diagnosis not present

## 2019-08-15 DIAGNOSIS — I10 Essential (primary) hypertension: Secondary | ICD-10-CM | POA: Diagnosis not present

## 2019-08-15 DIAGNOSIS — E1122 Type 2 diabetes mellitus with diabetic chronic kidney disease: Secondary | ICD-10-CM | POA: Diagnosis not present

## 2019-08-15 DIAGNOSIS — Z794 Long term (current) use of insulin: Secondary | ICD-10-CM | POA: Insufficient documentation

## 2019-08-15 DIAGNOSIS — R12 Heartburn: Secondary | ICD-10-CM | POA: Diagnosis present

## 2019-08-15 LAB — CBC WITH DIFFERENTIAL/PLATELET
Abs Immature Granulocytes: 0.01 10*3/uL (ref 0.00–0.07)
Basophils Absolute: 0 10*3/uL (ref 0.0–0.1)
Basophils Relative: 1 %
Eosinophils Absolute: 0.4 10*3/uL (ref 0.0–0.5)
Eosinophils Relative: 9 %
HCT: 26.1 % — ABNORMAL LOW (ref 36.0–46.0)
Hemoglobin: 8.8 g/dL — ABNORMAL LOW (ref 12.0–15.0)
Immature Granulocytes: 0 %
Lymphocytes Relative: 22 %
Lymphs Abs: 0.9 10*3/uL (ref 0.7–4.0)
MCH: 32 pg (ref 26.0–34.0)
MCHC: 33.7 g/dL (ref 30.0–36.0)
MCV: 94.9 fL (ref 80.0–100.0)
Monocytes Absolute: 0.7 10*3/uL (ref 0.1–1.0)
Monocytes Relative: 16 %
Neutro Abs: 2.3 10*3/uL (ref 1.7–7.7)
Neutrophils Relative %: 52 %
Platelets: 190 10*3/uL (ref 150–400)
RBC: 2.75 MIL/uL — ABNORMAL LOW (ref 3.87–5.11)
RDW: 15.2 % (ref 11.5–15.5)
WBC: 4.3 10*3/uL (ref 4.0–10.5)
nRBC: 0 % (ref 0.0–0.2)

## 2019-08-15 LAB — COMPREHENSIVE METABOLIC PANEL
ALT: 19 U/L (ref 0–44)
AST: 25 U/L (ref 15–41)
Albumin: 3.4 g/dL — ABNORMAL LOW (ref 3.5–5.0)
Alkaline Phosphatase: 70 U/L (ref 38–126)
Anion gap: 7 (ref 5–15)
BUN: 59 mg/dL — ABNORMAL HIGH (ref 8–23)
CO2: 26 mmol/L (ref 22–32)
Calcium: 8.9 mg/dL (ref 8.9–10.3)
Chloride: 100 mmol/L (ref 98–111)
Creatinine, Ser: 1.51 mg/dL — ABNORMAL HIGH (ref 0.44–1.00)
GFR calc Af Amer: 34 mL/min — ABNORMAL LOW (ref >60)
GFR calc non Af Amer: 29 mL/min — ABNORMAL LOW (ref >60)
Glucose, Bld: 115 mg/dL — ABNORMAL HIGH (ref 70–99)
Potassium: 5.3 mmol/L — ABNORMAL HIGH (ref 3.5–5.1)
Sodium: 133 mmol/L — ABNORMAL LOW (ref 135–145)
Total Bilirubin: 0.1 mg/dL — ABNORMAL LOW (ref 0.3–1.2)
Total Protein: 7 g/dL (ref 6.5–8.1)

## 2019-08-15 LAB — LIPASE, BLOOD: Lipase: 30 U/L (ref 11–51)

## 2019-08-15 MED ORDER — SUCRALFATE 1 GM/10ML PO SUSP
1.0000 g | Freq: Once | ORAL | Status: AC
  Administered 2019-08-15: 1 g via ORAL
  Filled 2019-08-15: qty 10

## 2019-08-15 MED ORDER — PANTOPRAZOLE SODIUM 40 MG PO TBEC
80.0000 mg | DELAYED_RELEASE_TABLET | Freq: Once | ORAL | Status: AC
  Administered 2019-08-15: 80 mg via ORAL
  Filled 2019-08-15: qty 2

## 2019-08-15 MED ORDER — PANTOPRAZOLE SODIUM 40 MG PO TBEC: 40.0000 mg | DELAYED_RELEASE_TABLET | Freq: Every day | ORAL | 1 refills | Status: DC

## 2019-08-15 MED ORDER — PANTOPRAZOLE SODIUM 40 MG IV SOLR
40.0000 mg | Freq: Once | INTRAVENOUS | Status: AC
  Administered 2019-08-15: 40 mg via INTRAVENOUS
  Filled 2019-08-15: qty 40

## 2019-08-15 MED ORDER — SUCRALFATE 1 GM/10ML PO SUSP: 1.0000 g | Freq: Three times a day (TID) | ORAL | 0 refills | Status: DC

## 2019-08-15 NOTE — ED Provider Notes (Signed)
[facility] DEPT Provider Note: Shannon Spurling, MD, FACEP  CSN: [id number] MRN: [id number] ARRIVAL: 08/15/19 at [place]: [place]  Gastroesophageal Reflux   HISTORY OF PRESENT ILLNESS  08/15/19 4:41 AM Shannon [name] is a 86 y.o. female with a history of GERD and peptic ulcer disease. She has had "heartburn" for over a week. It is been intermittent and is present in her epigastrium and up into her substernal region of her throat. She describes it as feeling like acid reflux. She states she is on acid reflux medication but a review of her records shows that she is not currently on a PPI. Her symptoms are worse when she belches and when she drinks. She does not take large pills or solid food by mouth; for this she has a gastrostomy tube. She is able to take fluids and small pills orally.   She went to bed yesterday evening with "an uneasy feeling" in her chest after eating dinner yesterday evening.  She woke up out of sleep this morning, just prior to arrival, with burning in her throat and chest.  She has a history of GERD and peptic ulcer disease but is not currently on a PPI.  She has some shortness of breath which has been present" for a while" but cannot correlate it to the pain of her acid reflux.  Past Medical History:  Diagnosis Date  . Acute respiratory failure ([place])   . Anemia    previous blood transfusions  . Arthritis    "all over"  . Asthma   . Atrial flutter ([place])   . Bradycardia    requiring previous d/c of BB and reduction of amiodarone  . CAD (coronary artery disease)    nonobstructive per notes  . Chronic diastolic CHF (congestive heart failure) ([place])   . CKD (chronic kidney disease), stage III   . Complication of blood transfusion    "got the wrong blood type at [facility] in ~ 2015; no adverse reaction that we are aware of"/daughter, [name] (01/27/2016)  . COPD (chronic obstructive pulmonary disease) ([place])   . Depression    "light case"    . DVT (deep venous thrombosis) ([place]) 01/2016   a. LLE DVT 01/2016 - switched from Eliquis to Coumadin.  [name] Dyspnea    with some activity  . Gastric stenosis    a. s/p stomach tube  . GERD (gastroesophageal reflux disease)   . History of blood transfusion    "several" (01/27/2016)  . History of stomach ulcers   . Hyperlipidemia   . Hypertension   . Hypothyroidism   . On home oxygen therapy    "2L; 9PM til 9AM" (06/27/2017)  . PAD (peripheral artery disease) ([place])    a. prior gangrene, toe amputations, intervention.  [name] PAF (paroxysmal atrial fibrillation) ([place])   . Paraesophageal hernia   . Perforated gastric ulcer ([place])   . Pneumonia    "a few times" (06/27/2017)  . Seasonal allergies   . SIADH (syndrome of inappropriate ADH production) ([place])    [name] 01/10/2015  . Small bowel obstruction ([place])    "I don't know how many" (01/11/2015)  . Stroke [facility])    several- no residual  . Type II diabetes mellitus ([place])    "related to prednisone use  for > 20 yr; once predinose stopped; no more DM RX" (01/27/2016)  . UTI (urinary tract infection) 02/08/2016  . Ventral hernia with bowel obstruction     Past Surgical History:  Procedure Laterality Date  . ABDOMINAL AORTOGRAM N/A 09/21/2016   Procedure: Abdominal Aortogram;  Surgeon: Waynetta Sandy, MD;  Location: [place] [facility];  Service: Cardiovascular;  Laterality: N/A;  . AMPUTATION Left 09/25/2016   Procedure: AMPUTATION DIGIT- LEFT 2ND AND 3RD TOES;  Surgeon: Rosetta Posner, MD;  Location: [place];  Service: Vascular;  Laterality: Left;  . AMPUTATION Right 05/03/2018   Procedure: AMPUTATION RIGHT GREAT TOE;  Surgeon: Rosetta Posner, MD;  Location: [place];  Service: Vascular;  Laterality: Right;  . CATARACT EXTRACTION W/ INTRAOCULAR LENS  IMPLANT, BILATERAL    . CHOLECYSTECTOMY OPEN    . COLECTOMY    . ESOPHAGOGASTRODUODENOSCOPY N/A 01/19/2014   Procedure: ESOPHAGOGASTRODUODENOSCOPY (EGD);  Surgeon: Irene Shipper, MD;   Location: [facility];  Service: Endoscopy;  Laterality: N/A;  . ESOPHAGOGASTRODUODENOSCOPY N/A 01/20/2014   Procedure: ESOPHAGOGASTRODUODENOSCOPY (EGD);  Surgeon: Irene Shipper, MD;  Location: [facility];  Service: Endoscopy;  Laterality: N/A;  . ESOPHAGOGASTRODUODENOSCOPY N/A 03/19/2014   Procedure: ESOPHAGOGASTRODUODENOSCOPY (EGD);  Surgeon: Milus Banister, MD;  Location: [facility];  Service: Endoscopy;  Laterality: N/A;  . ESOPHAGOGASTRODUODENOSCOPY N/A 07/08/2015   Procedure: ESOPHAGOGASTRODUODENOSCOPY (EGD);  Surgeon: Doran Stabler, MD;  Location[facility] END[facility]COPY;  Service: Endoscopy;  Laterality: N/A;  . ESOPHAGOGASTRODUODENOSCOPY (EGD) WITH PROPOFOL N/A 09/15/2015   Procedure: ESOPHAGOGASTRODUODENOSCOPY (EGD) WITH PROPOFOL;  Surgeon: Manus Gunning, MD;  Location: [facility];  Service: Gastroenterology;  Laterality: N/A;  . GASTROJEJUNOSTOMY     hx/notes 01/10/2015  . GASTROJEJUNOSTOMY N/A 09/23/2015   Procedure: OPEN GASTROJEJUNOSTOMY TUBE PLACEMENT;  Surgeon: Arta Bruce Kinsinger, MD;  Location: [facility];  Service: General;  Laterality: N/A;  . GLAUCOMA SURGERY Bilateral   . HERNIA REPAIR  2015  . IR CM INJ ANY COLONIC TUBE W/FLUORO  01/05/2017  . IR CM INJ ANY COLONIC TUBE W/FLUORO  06/07/2017  . IR CM INJ ANY COLONIC TUBE W/FLUORO  11/05/2017  . IR CM INJ ANY COLONIC TUBE W/FLUORO  09/18/2018  . IR CM INJ ANY COLONIC TUBE W/FLUORO  03/06/2019  . IR CM INJ ANY COLONIC TUBE W/FLUORO  03/11/2019  . IR GENERIC HISTORICAL  01/07/2016   IR GJ TUBE CHANGE 01/07/2016 Jacqulynn Cadet, MD [facility]  . IR GENERIC HISTORICAL  01/27/2016   IR MECH REMOV OBSTRUC MAT ANY COLON TUBE W/FLUORO 01/27/2016 Markus Daft, MD [facility]  . IR GENERIC HISTORICAL  02/07/2016   IR PATIENT EVAL TECH 0-60 MINS Darrell K Allred, PA-C [facility]  . IR GENERIC HISTORICAL  02/08/2016   IR GJ TUBE CHANGE 02/08/2016 Greggory Keen, MD [facility]  . IR GENERIC HISTORICAL  01/06/2016   IR GJ TUBE CHANGE  01/06/2016 [facility] OUT REF  . IR GENERIC HISTORICAL  05/02/2016   IR CM INJ ANY COLONIC TUBE W/FLUORO 05/02/2016 Arne [place], MD [facility]  . IR GENERIC HISTORICAL  05/15/2016   IR GJ TUBE CHANGE 05/15/2016 Sandi Mariscal, MD [facility]  . IR GENERIC HISTORICAL  06/28/2016   IR GJ TUBE CHANGE 06/28/2016 [facility]  . IR GJ TUBE CHANGE  02/20/2017  . IR GJ TUBE CHANGE  05/10/2017  . IR GJ TUBE CHANGE  07/06/2017  . IR GJ TUBE CHANGE  08/02/2017  . IR GJ TUBE CHANGE  10/15/2017  . IR GJ TUBE CHANGE  12/26/2017  . IR [place] TUBE CHANGE  04/08/2018  . IR [place] TUBE CHANGE  06/19/2018  . IR [place] TUBE CHANGE  03/03/2019  . IR GJ  TUBE CHANGE  06/06/2019  . IR PATIENT EVAL TECH 0-60 MINS  10/19/2016  . IR PATIENT EVAL TECH 0-60 MINS  12/25/2016  . IR PATIENT EVAL TECH 0-60 MINS  01/29/2017  . IR PATIENT EVAL TECH 0-60 MINS  04/04/2017  . IR PATIENT EVAL TECH 0-60 MINS  04/30/2017  . IR PATIENT EVAL TECH 0-60 MINS  07/31/2017  . IR REPLC DUODEN/JEJUNO TUBE PERCUT W/FLUORO  11/14/2016  . LAPAROTOMY N/A 01/20/2015   Procedure: EXPLORATORY LAPAROTOMY;  Surgeon: Coralie Keens, MD;  Location: [facility];  Service: General;  Laterality: N/A;  . LOWER EXTREMITY ANGIOGRAPHY Left 09/21/2016   Procedure: Lower Extremity Angiography;  Surgeon: Waynetta Sandy, MD;  Location: [place];  Service: Cardiovascular;  Laterality: Left;  . LOWER EXTREMITY ANGIOGRAPHY Right 06/27/2017   Procedure: Lower Extremity Angiography;  Surgeon: Waynetta Sandy, MD;  Location: [place] [facility];  Service: Cardiovascular;  Laterality: Right;  . LYSIS OF ADHESION N/A 01/20/2015   Procedure: LYSIS OF ADHESIONS < 1 HOUR;  Surgeon: Coralie Keens, MD;  Location: [facility];  Service: General;  Laterality: N/A;  . PERIPHERAL VASCULAR BALLOON ANGIOPLASTY Left 09/21/2016   Procedure: Peripheral Vascular Balloon Angioplasty;  Surgeon: Waynetta Sandy, MD;  Location: [facility] [facility];  Service: Cardiovascular;  Laterality:  Left;  drug coated balloon  . PERIPHERAL VASCULAR BALLOON ANGIOPLASTY Right 06/27/2017   Procedure: PERIPHERAL VASCULAR BALLOON ANGIOPLASTY;  Surgeon: Waynetta Sandy, MD;  Location: [facility];  Service: Cardiovascular;  Laterality: Right;  SFA/TPTRUNK  . TONSILLECTOMY    . TUBAL LIGATION    . VENTRAL HERNIA REPAIR  2015   incarcerated ventral hernia ([facility] 09/2013)/notes 01/10/2015    Family History  Problem Relation Age of Onset  . Stroke Mother   . Hypertension Mother   . Diabetes Brother   . Glaucoma Son   . Glaucoma Son   . Heart attack Neg Hx   . Colon cancer Neg Hx   . Stomach cancer Neg Hx     Social History   Tobacco Use  . Smoking status: Never Smoker  . Smokeless tobacco: Former Systems developer    Types: Snuff  . Tobacco comment: "used snuff in my younger days"  Substance Use Topics  . Alcohol use: Never    Alcohol/week: 0.0 standard drinks  . Drug use: Never    Prior to Admission medications   Medication Sig Start Date End Date Taking? Authorizing Provider  acetaminophen (TYLENOL) 500 MG tablet Take 500 mg by mouth every 6 (six) hours as needed (pain).     [provider]  Amino Acids-Protein Hydrolys (FEEDING SUPPLEMENT, PRO-STAT SUGAR FREE 64,) LIQD Take 30 mLs by mouth 2 (two) times daily.    [provider]  amLODipine (NORVASC) 5 MG tablet Take 1 tablet (5 mg total) by mouth daily. 11/26/18 11/26/19  Marcell Anger, MD  atorvastatin (LIPITOR) 10 MG tablet TAKE 1 TABLET BY MOUTH EVERY DAY 06/25/19   [name], NP  b complex vitamins tablet Take 1 tablet by mouth daily.     [provider]  calcium-vitamin D (OSCAL WITH D) 500-200 MG-UNIT per tablet Take 1 tablet by mouth daily with breakfast.     [provider]  Carboxymethylcellulose Sodium (ARTIFICIAL TEARS OP) Place 1 drop into both eyes 4 (four) times daily.    [provider]  cycloSPORINE (RESTASIS) 0.05 % ophthalmic emulsion USE 1 DROP  INTO BOTH EYES TWICE DAILY 01/29/17  Eulas Post, Brayton Layman, DO  DULERA 200-5 MCG/ACT AERO TAKE TWO PUFFS BY MOUTH TWICE DAILY 01/13/19   [name], NP  feeding supplement (OSMOLITE 1 CAL) LIQD Take 711 mLs by mouth See admin instructions. For 12 hours 2100 - 0900    [provider]  ferrous sulfate 220 (44 Fe) MG/5ML solution Take 7.4 mLs (325 mg total) by mouth daily. 06/10/19   [name], NP  fluticasone (FLONASE) 50 MCG/ACT nasal spray SPRAY 2 SPRAYS INTO EACH NOSTRIL EVERY DAY 06/25/19   [name], NP  furosemide (LASIX) 8 MG/ML solution 20 mg (2.5 ml) daily unless swelling occurs take 40 mg (5 ml) and then switch back to 20 mg daily 06/10/19   [name], NP  gabapentin (NEURONTIN) 250 MG/5ML solution Take 6 mLs (300 mg total) by mouth 2 (two) times daily. 06/10/19   [name], NP  gentamicin cream (GARAMYCIN) 0.1 % Apply 1 application topically 3 (three) times daily as needed (dry skin). 07/16/19   Edrick Kins, DPM  hydrALAZINE (APRESOLINE) 50 MG tablet TAKE 1 TABLET BY MOUTH THREE TIMES A DAY 08/04/19   Dorothy Spark, MD  insulin aspart (NOVOLOG) 100 UNIT/ML FlexPen Inject 0-6 Units into the skin 3 (three) times daily with meals. New sliding Scale If BS is 151 - 251 = 2 units,  If BS is 251 -  350 = 4 units,  If BS is 351 - 450 = 6 units Call provider if BS runs more than 400 02/03/19   [name], NP  Insulin Glargine (LANTUS SOLOSTAR) 100 UNIT/ML Solostar Pen Inject 8 Units into the skin at bedtime. 02/20/19   [name], NP  Insulin Pen Needle (B-D UF III MINI PEN NEEDLES) 31G X 5 MM MISC Use twice daily with giving insulin injections. Dx E11.9 07/17/18   [name], NP  ipratropium-albuterol (DUONEB) 0.5-2.5 (3) MG/3ML SOLN Take 3 mLs by nebulization every 6 (six) hours as needed (sob). Use 3 times daily x 4 days then every 6 hours as needed. 03/21/16   Eileen Stanford, PA-C  isosorbide mononitrate (IMDUR) 30 MG 24 hr  tablet TAKE 2 TABLETS BY MOUTH DAILY. PLEASE MAKE ANNUAL APPT WITH DR. Meda Coffee FOR FUTURE REFILLS. THANK YOU. 1ST ATTEMPT. 12/27/18   Dorothy Spark, MD  latanoprost (XALATAN) 0.005 % ophthalmic solution Place 1 drop into both eyes at bedtime. 06/02/14   Blanchie Serve, MD  levalbuterol [facility] HFA) 45 MCG/ACT inhaler Inhale 2 puffs into the lungs every 6 (six) hours as needed for wheezing or shortness of breath. 02/03/16   Gildardo Cranker, DO  levothyroxine (SYNTHROID) 125 MCG tablet TAKE 1 TABLET BY MOUTH EVERY DAY 03/18/19   [name], NP  loratadine (CLARITIN) 10 MG tablet Take 10 mg by mouth daily as needed for allergies.    [provider]  Multiple Vitamin (MULTIVITAMIN WITH MINERALS) TABS tablet Take 1 tablet by mouth daily.    [provider]  nystatin (MYCOSTATIN/NYSTOP) powder Apply 1 g topically as needed (skin irritation).     [provider]  oxyCODONE (ROXICODONE) 5 MG immediate release tablet Take 1 tablet (5 mg total) by mouth every 4 (four) hours as needed for severe pain. May take 1/2 tab q4hrs as needed for mild pain. 08/11/19   [name], NP  pantoprazole (PROTONIX) 40 MG tablet Take 1 tablet (40 mg total) by mouth daily. 08/15/19   Leonid Manus, Jenny Reichmann, MD  Probiotic Product (EQL PROBIOTIC  COLON SUPPORT PO) Take 1 capsule by mouth daily.    [provider]  sertraline (ZOLOFT) 20 MG/ML concentrated solution Take 2.5 mLs (50 mg total) by mouth daily. 06/10/19   [name], NP  sucralfate (CARAFATE) 1 GM/10ML suspension Take 10 mLs (1 g total) by mouth 4 (four) times daily -  with meals and at bedtime. 08/15/19   Orlander Norwood, MD  warfarin (COUMADIN) 2.5 MG tablet Take 2-3 tablets by mouth daily as directed by Coumadin clinic. Patient taking differently: Take 2.5 mg by mouth as directed. Take 3 tablets (7.5mg ) on Thursdays and Sundays, and take 2 tablets (5mg ) on the remaining days 03/24/19   Dorothy Spark, MD  Water For  Irrigation, Sterile (FREE WATER) SOLN Place 180 mLs into feeding tube 3 (three) times daily.     [provider]  White Petrolatum-Mineral Oil (SYSTANE NIGHTTIME) OINT Place 1 application into both eyes at bedtime.     [provider]    Allergies Penicillins   REVIEW OF SYSTEMS  Negative except as noted here or in the History of Present Illness.   PHYSICAL EXAMINATION  Initial Vital Signs Blood pressure 137/80, pulse 63, temperature 97.6 F (36.4 C), resp. rate (!) 21, SpO2 100 %.  Examination General: Well-developed, thin female in no acute distress; appearance consistent with age of record HENT: normocephalic; atraumatic Eyes: pupils equal, round and reactive to light; extraocular muscles intact; arcus senilis bilaterally Neck: supple Heart: regular rate and rhythm Lungs: clear to auscultation bilaterally Abdomen: soft; nondistended; epigastric tenderness; multiple surgical scars; gastrostomy tube left upper quadrant; bowel sounds present Extremities: No deformity; full range of motion; pulses normal Neurologic: Awake, alert; motor function intact in all extremities and symmetric; no facial droop Skin: Warm and dry Psychiatric: Normal mood and affect   RESULTS  Summary of this visit's results, reviewed and interpreted by myself:   EKG Interpretation  Date/Time:  Friday Aug 15 2019 04:32:31 EDT Ventricular Rate:  61 PR Interval:    QRS Duration: 83 QT Interval:  430 QTC Calculation: 434 R Axis:   50 Text Interpretation: Sinus rhythm Normal ECG No significant change was found Confirmed by Shanon Rosser [phone]) on 08/15/2019 4:41:50 AM      Laboratory Studies: Results for orders placed or performed during the hospital encounter of 08/15/19 (from the past 24 hour(s))  CBC with Differential/Platelet     Status: Abnormal   Collection Time: 08/15/19  5:19 AM  Result Value Ref Range   WBC 4.3 4.0 - 10.5 K/uL   RBC 2.75 (L) 3.87 - 5.11 MIL/uL    Hemoglobin 8.8 (L) 12.0 - 15.0 g/dL   HCT 26.1 (L) 36.0 - 46.0 %   MCV 94.9 80.0 - 100.0 fL   MCH 32.0 26.0 - 34.0 pg   MCHC 33.7 30.0 - 36.0 g/dL   RDW 15.2 11.5 - 15.5 %   Platelets 190 150 - 400 K/uL   nRBC 0.0 0.0 - 0.2 %   Neutrophils Relative % 52 %   Neutro Abs 2.3 1.7 - 7.7 K/uL   Lymphocytes Relative 22 %   Lymphs Abs 0.9 0.7 - 4.0 K/uL   Monocytes Relative 16 %   Monocytes Absolute 0.7 0.1 - 1.0 K/uL   Eosinophils Relative 9 %   Eosinophils Absolute 0.4 0.0 - 0.5 K/uL   Basophils Relative 1 %   Basophils Absolute 0.0 0.0 - 0.1 K/uL   Immature Granulocytes 0 %   Abs Immature Granulocytes 0.01 0.00 -  0.07 K/uL  Comprehensive metabolic panel     Status: Abnormal   Collection Time: 08/15/19  5:19 AM  Result Value Ref Range   Sodium 133 (L) 135 - 145 mmol/L   Potassium 5.3 (H) 3.5 - 5.1 mmol/L   Chloride 100 98 - 111 mmol/L   CO2 26 22 - 32 mmol/L   Glucose, Bld 115 (H) 70 - 99 mg/dL   BUN 59 (H) 8 - 23 mg/dL   Creatinine, Ser 1.51 (H) 0.44 - 1.00 mg/dL   Calcium 8.9 8.9 - 10.3 mg/dL   Total Protein 7.0 6.5 - 8.1 g/dL   Albumin 3.4 (L) 3.5 - 5.0 g/dL   AST 25 15 - 41 U/L   ALT 19 0 - 44 U/L   Alkaline Phosphatase 70 38 - 126 U/L   Total Bilirubin 0.1 (L) 0.3 - 1.2 mg/dL   GFR calc non Af Amer 29 (L) >60 mL/min   GFR calc Af Amer 34 (L) >60 mL/min   Anion gap 7 5 - 15  Lipase, blood     Status: None   Collection Time: 08/15/19  5:19 AM  Result Value Ref Range   Lipase 30 11 - 51 U/L   *Note: Due to a large number of results and/or encounters for the requested time period, some results have not been displayed. A complete set of results can be found in Results Review.   Imaging Studies: No results found.  ED COURSE and MDM  Nursing notes, initial and subsequent vitals signs, including pulse oximetry, reviewed and interpreted by myself.  Vitals:   08/15/19 0433 08/15/19 0530  BP: 137/80 (!) 136/53  Pulse: 63 (!) 55  Resp: (!) 21 13  Temp: 97.6 F (36.4 C)    SpO2: 100% 98%   Medications  sucralfate (CARAFATE) 1 GM/10ML suspension 1 g (1 g Oral Given 08/15/19 0443)  pantoprazole (PROTONIX) EC tablet 80 mg (80 mg Oral Given 08/15/19 0443)  pantoprazole (PROTONIX) injection 40 mg (40 mg Intravenous Given 08/15/19 0506)   6:43 AM Patient feeling significantly better after IV Protonix and oral Carafate.  Patient is anemic but her anemia appears chronic without significant acute change.  She also has chronic renal insufficiency which is only slightly worse than previous values.  I do not believe that any additional emergency intervention is indicated.  We will start her on Carafate and Protonix.   PROCEDURES  Procedures   ED DIAGNOSES     ICD-10-CM   1. Gastroesophageal reflux disease, unspecified whether esophagitis present  K21.9        Taegan Haider, MD 08/15/19 [phone]

## 2019-08-15 NOTE — ED Notes (Signed)
Made patient aware of discharge. This Probation officer told patient daughter will be called for transport home. Patient was taken to the restroom and changed into clothes per patient request.

## 2019-08-15 NOTE — ED Notes (Signed)
Patient was taken outside by this Probation officer. Patient was unable to sign for discharge. Patient given discharge teaching and verbalized understanding.

## 2019-08-15 NOTE — ED Triage Notes (Signed)
States she went to bed with an uneasy feeling in chest after dinner, was woke up out of sleep with burning in throat and chest, states she has two hernias which may be to blame, 12 lead by EMS unremarkable, EMS vitals wnl

## 2019-08-19 ENCOUNTER — Encounter: Payer: Self-pay | Admitting: *Deleted

## 2019-08-23 ENCOUNTER — Other Ambulatory Visit: Payer: Self-pay | Admitting: Cardiology

## 2019-08-27 ENCOUNTER — Observation Stay (HOSPITAL_COMMUNITY)
Admission: EM | Admit: 2019-08-27 | Discharge: 2019-08-28 | Disposition: A | Discharge status: Home / Self Care | Payer: Medicare Other | Attending: Internal Medicine | Admitting: Internal Medicine

## 2019-08-27 ENCOUNTER — Other Ambulatory Visit: Payer: Self-pay

## 2019-08-27 ENCOUNTER — Encounter (HOSPITAL_COMMUNITY): Payer: Self-pay | Admitting: *Deleted

## 2019-08-27 ENCOUNTER — Telehealth: Payer: Self-pay | Admitting: Cardiology

## 2019-08-27 ENCOUNTER — Emergency Department (HOSPITAL_COMMUNITY): Payer: Medicare Other

## 2019-08-27 ENCOUNTER — Ambulatory Visit: Payer: Medicare Other | Admitting: Podiatry

## 2019-08-27 DIAGNOSIS — Z931 Gastrostomy status: Secondary | ICD-10-CM | POA: Diagnosis not present

## 2019-08-27 DIAGNOSIS — R072 Precordial pain: Secondary | ICD-10-CM | POA: Diagnosis not present

## 2019-08-27 DIAGNOSIS — I4819 Other persistent atrial fibrillation: Secondary | ICD-10-CM | POA: Diagnosis present

## 2019-08-27 DIAGNOSIS — Z7951 Long term (current) use of inhaled steroids: Secondary | ICD-10-CM | POA: Insufficient documentation

## 2019-08-27 DIAGNOSIS — R0789 Other chest pain: Principal | ICD-10-CM | POA: Insufficient documentation

## 2019-08-27 DIAGNOSIS — Z87891 Personal history of nicotine dependence: Secondary | ICD-10-CM | POA: Insufficient documentation

## 2019-08-27 DIAGNOSIS — D631 Anemia in chronic kidney disease: Secondary | ICD-10-CM | POA: Diagnosis not present

## 2019-08-27 DIAGNOSIS — I5032 Chronic diastolic (congestive) heart failure: Secondary | ICD-10-CM | POA: Diagnosis not present

## 2019-08-27 DIAGNOSIS — I2510 Atherosclerotic heart disease of native coronary artery without angina pectoris: Secondary | ICD-10-CM | POA: Insufficient documentation

## 2019-08-27 DIAGNOSIS — Z794 Long term (current) use of insulin: Secondary | ICD-10-CM | POA: Insufficient documentation

## 2019-08-27 DIAGNOSIS — Z7901 Long term (current) use of anticoagulants: Secondary | ICD-10-CM | POA: Diagnosis not present

## 2019-08-27 DIAGNOSIS — Z66 Do not resuscitate: Secondary | ICD-10-CM | POA: Diagnosis not present

## 2019-08-27 DIAGNOSIS — R0602 Shortness of breath: Secondary | ICD-10-CM | POA: Diagnosis not present

## 2019-08-27 DIAGNOSIS — E1159 Type 2 diabetes mellitus with other circulatory complications: Secondary | ICD-10-CM | POA: Diagnosis not present

## 2019-08-27 DIAGNOSIS — E1122 Type 2 diabetes mellitus with diabetic chronic kidney disease: Secondary | ICD-10-CM | POA: Insufficient documentation

## 2019-08-27 DIAGNOSIS — Z961 Presence of intraocular lens: Secondary | ICD-10-CM | POA: Insufficient documentation

## 2019-08-27 DIAGNOSIS — Z880 Allergy status to penicillin: Secondary | ICD-10-CM | POA: Diagnosis not present

## 2019-08-27 DIAGNOSIS — Z86718 Personal history of other venous thrombosis and embolism: Secondary | ICD-10-CM | POA: Insufficient documentation

## 2019-08-27 DIAGNOSIS — R079 Chest pain, unspecified: Secondary | ICD-10-CM | POA: Diagnosis not present

## 2019-08-27 DIAGNOSIS — I959 Hypotension, unspecified: Secondary | ICD-10-CM | POA: Diagnosis not present

## 2019-08-27 DIAGNOSIS — K219 Gastro-esophageal reflux disease without esophagitis: Secondary | ICD-10-CM | POA: Diagnosis not present

## 2019-08-27 DIAGNOSIS — R001 Bradycardia, unspecified: Secondary | ICD-10-CM | POA: Diagnosis not present

## 2019-08-27 DIAGNOSIS — E785 Hyperlipidemia, unspecified: Secondary | ICD-10-CM | POA: Insufficient documentation

## 2019-08-27 DIAGNOSIS — R778 Other specified abnormalities of plasma proteins: Secondary | ICD-10-CM | POA: Insufficient documentation

## 2019-08-27 DIAGNOSIS — F329 Major depressive disorder, single episode, unspecified: Secondary | ICD-10-CM | POA: Insufficient documentation

## 2019-08-27 DIAGNOSIS — J449 Chronic obstructive pulmonary disease, unspecified: Secondary | ICD-10-CM | POA: Insufficient documentation

## 2019-08-27 DIAGNOSIS — I130 Hypertensive heart and chronic kidney disease with heart failure and stage 1 through stage 4 chronic kidney disease, or unspecified chronic kidney disease: Secondary | ICD-10-CM | POA: Insufficient documentation

## 2019-08-27 DIAGNOSIS — Z8673 Personal history of transient ischemic attack (TIA), and cerebral infarction without residual deficits: Secondary | ICD-10-CM | POA: Diagnosis not present

## 2019-08-27 DIAGNOSIS — E1169 Type 2 diabetes mellitus with other specified complication: Secondary | ICD-10-CM | POA: Insufficient documentation

## 2019-08-27 DIAGNOSIS — Z79899 Other long term (current) drug therapy: Secondary | ICD-10-CM | POA: Insufficient documentation

## 2019-08-27 DIAGNOSIS — I152 Hypertension secondary to endocrine disorders: Secondary | ICD-10-CM

## 2019-08-27 DIAGNOSIS — Z79890 Hormone replacement therapy (postmenopausal): Secondary | ICD-10-CM | POA: Insufficient documentation

## 2019-08-27 DIAGNOSIS — Z9981 Dependence on supplemental oxygen: Secondary | ICD-10-CM | POA: Insufficient documentation

## 2019-08-27 DIAGNOSIS — E039 Hypothyroidism, unspecified: Secondary | ICD-10-CM | POA: Diagnosis not present

## 2019-08-27 DIAGNOSIS — U071 COVID-19: Secondary | ICD-10-CM | POA: Diagnosis not present

## 2019-08-27 DIAGNOSIS — D638 Anemia in other chronic diseases classified elsewhere: Secondary | ICD-10-CM | POA: Diagnosis present

## 2019-08-27 DIAGNOSIS — N183 Chronic kidney disease, stage 3 unspecified: Secondary | ICD-10-CM | POA: Diagnosis not present

## 2019-08-27 DIAGNOSIS — I4891 Unspecified atrial fibrillation: Secondary | ICD-10-CM | POA: Diagnosis not present

## 2019-08-27 DIAGNOSIS — E1142 Type 2 diabetes mellitus with diabetic polyneuropathy: Secondary | ICD-10-CM | POA: Insufficient documentation

## 2019-08-27 DIAGNOSIS — Z9842 Cataract extraction status, left eye: Secondary | ICD-10-CM | POA: Insufficient documentation

## 2019-08-27 DIAGNOSIS — Z9841 Cataract extraction status, right eye: Secondary | ICD-10-CM | POA: Insufficient documentation

## 2019-08-27 DIAGNOSIS — E1151 Type 2 diabetes mellitus with diabetic peripheral angiopathy without gangrene: Secondary | ICD-10-CM | POA: Insufficient documentation

## 2019-08-27 DIAGNOSIS — R531 Weakness: Secondary | ICD-10-CM | POA: Diagnosis not present

## 2019-08-27 DIAGNOSIS — Z9862 Peripheral vascular angioplasty status: Secondary | ICD-10-CM | POA: Insufficient documentation

## 2019-08-27 LAB — CBC
HCT: 26.3 % — ABNORMAL LOW (ref 36.0–46.0)
Hemoglobin: 8.8 g/dL — ABNORMAL LOW (ref 12.0–15.0)
MCH: 31.1 pg (ref 26.0–34.0)
MCHC: 33.5 g/dL (ref 30.0–36.0)
MCV: 92.9 fL (ref 80.0–100.0)
Platelets: 255 10*3/uL (ref 150–400)
RBC: 2.83 MIL/uL — ABNORMAL LOW (ref 3.87–5.11)
RDW: 15.3 % (ref 11.5–15.5)
WBC: 4.6 10*3/uL (ref 4.0–10.5)
nRBC: 0.4 % — ABNORMAL HIGH (ref 0.0–0.2)

## 2019-08-27 LAB — BASIC METABOLIC PANEL
Anion gap: 15 (ref 5–15)
BUN: 57 mg/dL — ABNORMAL HIGH (ref 8–23)
CO2: 21 mmol/L — ABNORMAL LOW (ref 22–32)
Calcium: 9.2 mg/dL (ref 8.9–10.3)
Chloride: 99 mmol/L (ref 98–111)
Creatinine, Ser: 1.52 mg/dL — ABNORMAL HIGH (ref 0.44–1.00)
GFR calc Af Amer: 34 mL/min — ABNORMAL LOW (ref >60)
GFR calc non Af Amer: 29 mL/min — ABNORMAL LOW (ref >60)
Glucose, Bld: 96 mg/dL (ref 70–99)
Potassium: 4.5 mmol/L (ref 3.5–5.1)
Sodium: 135 mmol/L (ref 135–145)

## 2019-08-27 LAB — CBG MONITORING, ED: Glucose-Capillary: 86 mg/dL (ref 70–99)

## 2019-08-27 LAB — TROPONIN I (HIGH SENSITIVITY)
Troponin I (High Sensitivity): 191 ng/L (ref <18)
Troponin I (High Sensitivity): 144 ng/L (ref <18)
Troponin I (High Sensitivity): 125 ng/L (ref <18)

## 2019-08-27 LAB — PROTIME-INR
INR: 2.6 — ABNORMAL HIGH (ref 0.8–1.2)
Prothrombin Time: 27 seconds — ABNORMAL HIGH (ref 11.4–15.2)

## 2019-08-27 LAB — MAGNESIUM: Magnesium: 2.3 mg/dL (ref 1.7–2.4)

## 2019-08-27 LAB — SARS CORONAVIRUS 2 BY RT PCR (HOSPITAL ORDER, PERFORMED IN ~~LOC~~ HOSPITAL LAB): SARS Coronavirus 2: POSITIVE — AB

## 2019-08-27 MED ORDER — MOMETASONE FURO-FORMOTEROL FUM 200-5 MCG/ACT IN AERO
2.0000 | INHALATION_SPRAY | Freq: Two times a day (BID) | RESPIRATORY_TRACT | Status: DC
  Administered 2019-08-28 (×2): 2 via RESPIRATORY_TRACT
  Filled 2019-08-27: qty 8.8

## 2019-08-27 MED ORDER — ATORVASTATIN CALCIUM 10 MG PO TABS
10.0000 mg | ORAL_TABLET | Freq: Every day | ORAL | Status: DC
  Administered 2019-08-28: 10 mg via ORAL
  Filled 2019-08-27: qty 1

## 2019-08-27 MED ORDER — LEVALBUTEROL TARTRATE 45 MCG/ACT IN AERO: 2.0000 | INHALATION_SPRAY | Freq: Four times a day (QID) | RESPIRATORY_TRACT | Status: DC | PRN

## 2019-08-27 MED ORDER — SODIUM CHLORIDE 0.9% FLUSH: 3.0000 mL | Freq: Once | INTRAVENOUS | Status: DC

## 2019-08-27 MED ORDER — OSMOLITE 1.2 CAL PO LIQD: 720.0000 mL | ORAL | Status: DC

## 2019-08-27 MED ORDER — FREE WATER
180.0000 mL | Freq: Three times a day (TID) | Status: DC
  Administered 2019-08-28 (×2): 180 mL

## 2019-08-27 MED ORDER — FUROSEMIDE 20 MG PO TABS
20.0000 mg | ORAL_TABLET | Freq: Every day | ORAL | Status: DC
  Administered 2019-08-28: 20 mg via ORAL
  Filled 2019-08-27: qty 1

## 2019-08-27 MED ORDER — FERROUS SULFATE 300 (60 FE) MG/5ML PO SYRP
300.0000 mg | ORAL_SOLUTION | Freq: Every day | ORAL | Status: DC
  Administered 2019-08-28: 300 mg via ORAL
  Filled 2019-08-27: qty 5

## 2019-08-27 MED ORDER — ACETAMINOPHEN 325 MG PO TABS: 650.0000 mg | ORAL_TABLET | ORAL | Status: DC | PRN

## 2019-08-27 MED ORDER — GABAPENTIN 250 MG/5ML PO SOLN
300.0000 mg | Freq: Two times a day (BID) | ORAL | Status: DC
  Administered 2019-08-28 (×2): 300 mg via ORAL
  Filled 2019-08-27 (×3): qty 6

## 2019-08-27 MED ORDER — ISOSORBIDE MONONITRATE ER 60 MG PO TB24
60.0000 mg | ORAL_TABLET | Freq: Every day | ORAL | Status: DC
  Administered 2019-08-28: 60 mg via ORAL
  Filled 2019-08-27: qty 1

## 2019-08-27 MED ORDER — SERTRALINE HCL 50 MG PO TABS
50.0000 mg | ORAL_TABLET | Freq: Every day | ORAL | Status: DC
  Administered 2019-08-28: 50 mg via ORAL
  Filled 2019-08-27: qty 1

## 2019-08-27 MED ORDER — ONDANSETRON HCL 4 MG/2ML IJ SOLN: 4.0000 mg | Freq: Four times a day (QID) | INTRAMUSCULAR | Status: DC | PRN

## 2019-08-27 MED ORDER — HYDRALAZINE HCL 50 MG PO TABS
50.0000 mg | ORAL_TABLET | Freq: Three times a day (TID) | ORAL | Status: DC
  Administered 2019-08-28 (×2): 50 mg via ORAL
  Filled 2019-08-27 (×2): qty 1

## 2019-08-27 MED ORDER — LEVOTHYROXINE SODIUM 25 MCG PO TABS
125.0000 ug | ORAL_TABLET | Freq: Every day | ORAL | Status: DC
  Administered 2019-08-28: 125 ug via ORAL
  Filled 2019-08-27: qty 1

## 2019-08-27 MED ORDER — SUCRALFATE 1 GM/10ML PO SUSP
1.0000 g | Freq: Three times a day (TID) | ORAL | Status: DC
  Administered 2019-08-28 (×2): 1 g via ORAL
  Filled 2019-08-27 (×2): qty 10

## 2019-08-27 MED ORDER — WARFARIN SODIUM 5 MG PO TABS
5.0000 mg | ORAL_TABLET | Freq: Once | ORAL | Status: AC
  Administered 2019-08-27: 5 mg via ORAL
  Filled 2019-08-27: qty 1

## 2019-08-27 MED ORDER — INSULIN ASPART 100 UNIT/ML ~~LOC~~ SOLN: 0.0000 [IU] | Freq: Three times a day (TID) | SUBCUTANEOUS | Status: DC

## 2019-08-27 MED ORDER — PANTOPRAZOLE SODIUM 40 MG PO TBEC
40.0000 mg | DELAYED_RELEASE_TABLET | Freq: Every day | ORAL | Status: DC
  Administered 2019-08-28: 40 mg via ORAL
  Filled 2019-08-27: qty 1

## 2019-08-27 MED ORDER — WARFARIN - PHARMACIST DOSING INPATIENT: Freq: Every day | Status: DC

## 2019-08-27 NOTE — ED Provider Notes (Signed)
[facility] EMERGENCY DEPARTMENT Provider Note   CSN: [id number] Arrival date & time: 08/27/19  1227     History Chief Complaint  Patient presents with  . Weakness  . Shortness of Breath    Shannon [name] is a 86 y.o. female.  Patient is hard of hearing.  That is baseline.  Patient is a DNR.  Patient followed by [employer] care.  Patient brought in by EMS.  Patient was having chest pain under the left breast.  Patient has a history of atrial fib.  History of CHF takes fluid pills.  EMS gave her 250 cc in route.  She been feeling weak and having the chest pain and subjective shortness of breath.  Oxygen sats here were in the upper 90s.  Respiratory rate up some at 24 heart rate initially was 96.  But never tachycardic not febrile.  Blood pressure 151/72.  Chest pain is been on and off throughout the day.  Still having some now.  According to daughter patient felt fine yesterday.  Patient's had both Covid vaccines.        Past Medical History:  Diagnosis Date  . Acute respiratory failure ([place])   . Anemia    previous blood transfusions  . Arthritis    "all over"  . Asthma   . Atrial flutter ([place])   . Bradycardia    requiring previous d/c of BB and reduction of amiodarone  . CAD (coronary artery disease)    nonobstructive per notes  . Chronic diastolic CHF (congestive heart failure) ([place])   . CKD (chronic kidney disease), stage III   . Complication of blood transfusion    "got the wrong blood type at [facility] in ~ 2015; no adverse reaction that we are aware of"/daughter, Shannon [name] (01/27/2016)  . COPD (chronic obstructive pulmonary disease) ([place])   . Depression    "light case"  . DVT (deep venous thrombosis) ([place]) 01/2016   a. LLE DVT 01/2016 - switched from Eliquis to Coumadin.  [name] Dyspnea    with some activity  . Gastric stenosis    a. s/p stomach tube  . GERD (gastroesophageal reflux disease)   . History of blood transfusion    "several"  (01/27/2016)  . History of stomach ulcers   . Hyperlipidemia   . Hypertension   . Hypothyroidism   . On home oxygen therapy    "2L; 9PM til 9AM" (06/27/2017)  . PAD (peripheral artery disease) ([place])    a. prior gangrene, toe amputations, intervention.  [name] PAF (paroxysmal atrial fibrillation) ([place])   . Paraesophageal hernia   . Perforated gastric ulcer ([place])   . Pneumonia    "a few times" (06/27/2017)  . Seasonal allergies   . SIADH (syndrome of inappropriate ADH production) ([place])    [name] 01/10/2015  . Small bowel obstruction ([place])    "I don't know how many" (01/11/2015)  . Stroke [facility])    several- no residual  . Type II diabetes mellitus ([place])    "related to prednisone use  for > 20 yr; once predinose stopped; no more DM RX" (01/27/2016)  . UTI (urinary tract infection) 02/08/2016  . Ventral hernia with bowel obstruction     Patient Active Problem List   Diagnosis Date Noted  . Acute encephalopathy 07/07/2019  . AMS (altered mental status) 07/07/2019  . Varicose veins of right lower extremity with ulcer other part of foot ([place]) 06/11/2019  . Jejunostomy tube fell out 03/02/2019  . S/P  amputation of lesser toe, left ([place]) 02/13/2019  . BRBPR (bright red blood per rectum) 01/10/2019  . GI bleed 12/25/2018  . Bradycardia 11/25/2018  . Amputated great toe of right foot ([place]) 05/06/2018  . Peripheral vascular disease ([place]) 05/03/2018  . Localized, primary osteoarthritis of shoulder region 01/21/2018  . PAD (peripheral artery disease) ([place]) 06/27/2017  . AKI (acute kidney injury) ([place]) 02/20/2017  . Complication of feeding tube ([place]) 02/20/2017  . PEG tube malfunction ([place])   . COPD with asthma ([place]) 10/03/2016  . Chronic deep vein thrombosis (DVT) of tibial vein of left lower extremity ([place]) 10/03/2016  . Gangrene of toe of left foot ([place]) 09/15/2016  . Recurrent aspiration pneumonia ([place])   . Atherosclerosis of native arteries of the extremities with gangrene ([place]) 07/28/2016   . Other specified hypothyroidism   . CHF (NYHA class IV, ACC/AHA stage D) ([place])   . Shortness of breath   . Pressure injury of skin 03/11/2016  . PEG (percutaneous endoscopic gastrostomy) status ([place]) 03/10/2016  . Encounter for therapeutic drug monitoring 02/16/2016  . Warfarin anticoagulation   . Chronic seasonal allergic rhinitis   . Atrial fibrillation and flutter ([place]) 01/24/2016  . Chronic diastolic CHF (congestive heart failure) ([place]) 01/24/2016  . Bilateral leg edema 12/29/2015  . Hypothyroidism due to medication   . Weakness   . Pyloric stenosis   . Pressure ulcer 09/15/2015  . PVD (peripheral vascular disease) ([place])   . Diabetic ulcer of toe of right foot associated with type 2 diabetes mellitus, with necrosis of muscle ([place])   . Diverticulitis of large intestine without perforation or abscess without bleeding   . Colitis 08/26/2015  . Esophageal candidiasis ([place])   . SOB (shortness of breath)   . Acute renal failure superimposed on stage 4 chronic kidney disease ([place])   . Encounter for long-term (current) use of other high-risk medications   . Dysphagia 07/05/2015  . Asthma with allergic rhinitis 06/04/2015  . Hypertensive heart disease 03/18/2015  . Malnutrition of moderate degree 01/28/2015  . Syncope 01/27/2015  . Symptomatic bradycardia 01/21/2015  . Hypotension due to drugs 01/21/2015  . Protein-calorie malnutrition, severe 01/12/2015  . Cerebrovascular disease 07/14/2014  . Diabetic polyneuropathy associated with type 2 diabetes mellitus ([place]) 07/14/2014  . Hyperlipidemia 07/14/2014  . Gastric outlet obstruction 05/21/2014  . Unstable gait 04/29/2014  . Slurred speech   . Gastroparesis   . Constipation 02/03/2014  . Carotid stenosis 02/03/2014  . Demand ischemia ([place]) 01/21/2014  . Gastric bezoar 01/20/2014  . Anemia of chronic disease 01/18/2014  . Chronic kidney insufficiency, stage 3 (moderate) 01/18/2014  . Depression 09/29/2013  . Essential  hypertension, benign 09/22/2013  . GERD (gastroesophageal reflux disease) 09/22/2013  . Dyslipidemia 09/22/2013  . Type 2 diabetes mellitus with chronic kidney disease ([place]) 09/22/2013  . Persistent atrial fibrillation 09/22/2013  . History of completed stroke 09/22/2013  . Diabetic neuropathy ([place]) 09/22/2013  . Hypothyroidism 09/22/2013  . Chronic gastrojejunal ulcer with perforation ([place]) 09/07/2013  . Accelerated hypertension 09/07/2013  . Systemic lupus erythematosus (SLE) inhibitor ([place]) 09/07/2013  . Other specified abnormal immunological findings in serum 09/07/2013  . Red blood cell antibody positive 09/07/2013    Past Surgical History:  Procedure Laterality Date  . ABDOMINAL AORTOGRAM N/A 09/21/2016   Procedure: Abdominal Aortogram;  Surgeon: Waynetta Sandy, MD;  Location: [facility];  Service: Cardiovascular;  Laterality: N/A;  . AMPUTATION Left 09/25/2016   Procedure: AMPUTATION DIGIT- LEFT 2ND AND 3RD TOES;  Surgeon: Rosetta Posner, MD;  Location: [facility];  Service: Vascular;  Laterality: Left;  . AMPUTATION Right 05/03/2018   Procedure: AMPUTATION RIGHT GREAT TOE;  Surgeon: Rosetta Posner, MD;  Location: [facility];  Service: Vascular;  Laterality: Right;  . CATARACT EXTRACTION W/ INTRAOCULAR LENS  IMPLANT, BILATERAL    . CHOLECYSTECTOMY OPEN    . COLECTOMY    . ESOPHAGOGASTRODUODENOSCOPY N/A 01/19/2014   Procedure: ESOPHAGOGASTRODUODENOSCOPY (EGD);  Surgeon: Irene Shipper, MD;  [facility];  Service: Endoscopy;  Laterality: N/A;  . ESOPHAGOGASTRODUODENOSCOPY N/A 01/20/2014   Procedure: ESOPHAGOGASTRODUODENOSCOPY (EGD);  Surgeon: Irene Shipper, MD;  Location: [facility];  Service: Endoscopy;  Laterality: N/A;  . ESOPHAGOGASTRODUODENOSCOPY N/A 03/19/2014   Procedure: ESOPHAGOGASTRODUODENOSCOPY (EGD);  Surgeon: Milus Banister, MD;  Location: [facility];  Service: Endoscopy;  Laterality: N/A;  . ESOPHAGOGASTRODUODENOSCOPY N/A 07/08/2015   Procedure:  ESOPHAGOGASTRODUODENOSCOPY (EGD);  Surgeon: Doran Stabler, MD;  Location[facility] ENDOSCOPY;  Service: Endoscopy;  Laterality: N/A;  . ESOPHAGOGASTRODUODENOSCOPY (EGD) WITH PROPOFOL N/A 09/15/2015   Procedure: ESOPHAGOGASTRODUODENOSCOPY (EGD) WITH PROPOFOL;  Surgeon: Manus Gunning, MD;  Location: [facility];  Service: Gastroenterology;  Laterality: N/A;  . GASTROJEJUNOSTOMY     hx/notes 01/10/2015  . GASTROJEJUNOSTOMY N/A 09/23/2015   Procedure: OPEN GASTROJEJUNOSTOMY TUBE PLACEMENT;  Surgeon: Arta Bruce Kinsinger, MD;  Location: [facility];  Service: General;  Laterality: N/A;  . GLAUCOMA SURGERY Bilateral   . HERNIA REPAIR  2015  . IR CM INJ ANY COLONIC TUBE W/FLUORO  01/05/2017  . IR CM INJ ANY COLONIC TUBE W/FLUORO  06/07/2017  . IR CM INJ ANY COLONIC TUBE W/FLUORO  11/05/2017  . IR CM INJ ANY COLONIC TUBE W/FLUORO  09/18/2018  . IR CM INJ ANY COLONIC TUBE W/FLUORO  03/06/2019  . IR CM INJ ANY COLONIC TUBE W/FLUORO  03/11/2019  . IR GENERIC HISTORICAL  01/07/2016   IR GJ TUBE CHANGE 01/07/2016 Jacqulynn Cadet, MD [facility]  . IR GENERIC HISTORICAL  01/27/2016   IR MECH REMOV OBSTRUC MAT ANY COLON TUBE W/FLUORO 01/27/2016 Markus Daft, MD [facility]  . IR GENERIC HISTORICAL  02/07/2016   IR PATIENT EVAL TECH 0-60 MINS Darrell K Allred, PA-C [facility]  . IR GENERIC HISTORICAL  02/08/2016   IR GJ TUBE CHANGE 02/08/2016 Greggory Keen, MD [facility]  . IR GENERIC HISTORICAL  01/06/2016   IR GJ TUBE CHANGE 01/06/2016 [facility] OUT REF  . IR GENERIC HISTORICAL  05/02/2016   IR CM INJ ANY COLONIC TUBE W/FLUORO 05/02/2016 Arne Cleveland, MD [facility]  . IR GENERIC HISTORICAL  05/15/2016   IR GJ TUBE CHANGE 05/15/2016 Sandi Mariscal, MD [facility]  . IR GENERIC HISTORICAL  06/28/2016   IR GJ TUBE CHANGE 06/28/2016 [facility]  . IR GJ TUBE CHANGE  02/20/2017  . IR GJ TUBE CHANGE  05/10/2017  . IR GJ TUBE CHANGE  07/06/2017  . IR GJ TUBE CHANGE  08/02/2017  . IR GJ TUBE CHANGE  10/15/2017  . IR GJ  TUBE CHANGE  12/26/2017  . IR [place] TUBE CHANGE  04/08/2018  . IR [place] TUBE CHANGE  06/19/2018  . IR [place] TUBE CHANGE  03/03/2019  . IR [place] TUBE CHANGE  06/06/2019  . IR PATIENT EVAL TECH 0-60 MINS  10/19/2016  . IR PATIENT EVAL TECH 0-60 MINS  12/25/2016  . IR PATIENT EVAL TECH 0-60 MINS  01/29/2017  . IR PATIENT EVAL TECH 0-60 MINS  04/04/2017  . IR PATIENT  EVAL TECH 0-60 MINS  04/30/2017  . IR PATIENT EVAL TECH 0-60 MINS  07/31/2017  . IR REPLC DUODEN/JEJUNO TUBE PERCUT W/FLUORO  11/14/2016  . LAPAROTOMY N/A 01/20/2015   Procedure: EXPLORATORY LAPAROTOMY;  Surgeon: Coralie Keens, MD;  Location: [facility];  Service: General;  Laterality: N/A;  . LOWER EXTREMITY ANGIOGRAPHY Left 09/21/2016   Procedure: Lower Extremity Angiography;  Surgeon: Waynetta Sandy, MD;  Location: [facility];  Service: Cardiovascular;  Laterality: Left;  . LOWER EXTREMITY ANGIOGRAPHY Right 06/27/2017   Procedure: Lower Extremity Angiography;  Surgeon: Waynetta Sandy, MD;  Location: [facility];  Service: Cardiovascular;  Laterality: Right;  . LYSIS OF ADHESION N/A 01/20/2015   Procedure: LYSIS OF ADHESIONS < 1 HOUR;  Surgeon: Coralie Keens, MD;  Location: [facility];  Service: General;  Laterality: N/A;  . PERIPHERAL VASCULAR BALLOON ANGIOPLASTY Left 09/21/2016   Procedure: Peripheral Vascular Balloon Angioplasty;  Surgeon: Waynetta Sandy, MD;  Location: [facility];  Service: Cardiovascular;  Laterality: Left;  drug coated balloon  . PERIPHERAL VASCULAR BALLOON ANGIOPLASTY Right 06/27/2017   Procedure: PERIPHERAL VASCULAR BALLOON ANGIOPLASTY;  Surgeon: Waynetta Sandy, MD;  Location: [facility];  Service: Cardiovascular;  Laterality: Right;  SFA/TPTRUNK  . TONSILLECTOMY    . TUBAL LIGATION    . VENTRAL HERNIA REPAIR  2015   incarcerated ventral hernia ([facility] 09/2013)/notes 01/10/2015     OB History   No obstetric history on file.     Family History  Problem  Relation Age of Onset  . Stroke Mother   . Hypertension Mother   . Diabetes Brother   . Glaucoma Son   . Glaucoma Son   . Heart attack Neg Hx   . Colon cancer Neg Hx   . Stomach cancer Neg Hx     Social History   Tobacco Use  . Smoking status: Never Smoker  . Smokeless tobacco: Former Systems developer    Types: Snuff  . Tobacco comment: "used snuff in my younger days"  Substance Use Topics  . Alcohol use: Never    Alcohol/week: 0.0 standard drinks  . Drug use: Never    Home Medications Prior to Admission medications   Medication Sig Start Date End Date Taking? Authorizing Provider  acetaminophen (TYLENOL) 500 MG tablet Take 500 mg by mouth every 6 (six) hours as needed (pain).     [provider]  Amino Acids-Protein Hydrolys (FEEDING SUPPLEMENT, PRO-STAT SUGAR FREE 64,) LIQD Take 30 mLs by mouth 2 (two) times daily.    [provider]  amLODipine (NORVASC) 5 MG tablet Take 1 tablet (5 mg total) by mouth daily. 11/26/18 11/26/19  Marcell Anger, MD  atorvastatin (LIPITOR) 10 MG tablet TAKE 1 TABLET BY MOUTH EVERY DAY 06/25/19   [name], NP  b complex vitamins tablet Take 1 tablet by mouth daily.     [provider]  calcium-vitamin D (OSCAL WITH D) 500-200 MG-UNIT per tablet Take 1 tablet by mouth daily with breakfast.     [provider]  Carboxymethylcellulose Sodium (ARTIFICIAL TEARS OP) Place 1 drop into both eyes 4 (four) times daily.    [provider]  cycloSPORINE (RESTASIS) 0.05 % ophthalmic emulsion USE 1 DROP INTO BOTH EYES TWICE DAILY 01/29/17   Gildardo Cranker, DO  DULERA 200-5 MCG/ACT AERO TAKE TWO PUFFS BY MOUTH TWICE DAILY 01/13/19   [name], NP  feeding supplement (OSMOLITE 1 CAL) LIQD Take 711 mLs by  mouth See admin instructions. For 12 hours 2100 - 0900    [provider]  ferrous sulfate 220 (44 Fe) MG/5ML solution Take 7.4 mLs (325 mg total) by mouth daily. 06/10/19   [name], NP    fluticasone (FLONASE) 50 MCG/ACT nasal spray SPRAY 2 SPRAYS INTO EACH NOSTRIL EVERY DAY 06/25/19   [name], NP  furosemide (LASIX) 8 MG/ML solution 20 mg (2.5 ml) daily unless swelling occurs take 40 mg (5 ml) and then switch back to 20 mg daily 06/10/19   [name], NP  gabapentin (NEURONTIN) 250 MG/5ML solution Take 6 mLs (300 mg total) by mouth 2 (two) times daily. 06/10/19   [name], NP  gentamicin cream (GARAMYCIN) 0.1 % Apply 1 application topically 3 (three) times daily as needed (dry skin). 07/16/19   Edrick Kins, DPM  hydrALAZINE (APRESOLINE) 50 MG tablet TAKE 1 TABLET BY MOUTH THREE TIMES A DAY 08/04/19   Dorothy Spark, MD  insulin aspart (NOVOLOG) 100 UNIT/ML FlexPen Inject 0-6 Units into the skin 3 (three) times daily with meals. New sliding Scale If BS is 151 - 251 = 2 units,  If BS is 251 -  350 = 4 units,  If BS is 351 - 450 = 6 units Call provider if BS runs more than 400 02/03/19   [name], NP  Insulin Glargine (LANTUS SOLOSTAR) 100 UNIT/ML Solostar Pen Inject 8 Units into the skin at bedtime. 02/20/19   [name], NP  Insulin Pen Needle (B-D UF III MINI PEN NEEDLES) 31G X 5 MM MISC Use twice daily with giving insulin injections. Dx E11.9 07/17/18   [name], NP  ipratropium-albuterol (DUONEB) 0.5-2.5 (3) MG/3ML SOLN Take 3 mLs by nebulization every 6 (six) hours as needed (sob). Use 3 times daily x 4 days then every 6 hours as needed. 03/21/16   Eileen Stanford, PA-C  isosorbide mononitrate (IMDUR) 30 MG 24 hr tablet TAKE 2 TABLETS BY MOUTH DAILY. PLEASE MAKE ANNUAL APPT WITH DR. Meda Coffee FOR FUTURE REFILLS. THANK YOU. 1ST ATTEMPT. 12/27/18   Dorothy Spark, MD  latanoprost (XALATAN) 0.005 % ophthalmic solution Place 1 drop into both eyes at bedtime. 06/02/14   Blanchie Serve, MD  levalbuterol [facility] HFA) 45 MCG/ACT inhaler Inhale 2 puffs into the lungs every 6 (six) hours as needed for wheezing or shortness of breath.  02/03/16   Gildardo Cranker, DO  levothyroxine (SYNTHROID) 125 MCG tablet TAKE 1 TABLET BY MOUTH EVERY DAY 03/18/19   [name], NP  loratadine (CLARITIN) 10 MG tablet Take 10 mg by mouth daily as needed for allergies.    [provider]  Multiple Vitamin (MULTIVITAMIN WITH MINERALS) TABS tablet Take 1 tablet by mouth daily.    [provider]  nystatin (MYCOSTATIN/NYSTOP) powder Apply 1 g topically as needed (skin irritation).     [provider]  oxyCODONE (ROXICODONE) 5 MG immediate release tablet Take 1 tablet (5 mg total) by mouth every 4 (four) hours as needed for severe pain. May take 1/2 tab q4hrs as needed for mild pain. 08/11/19   [name], NP  pantoprazole (PROTONIX) 40 MG tablet Take 1 tablet (40 mg total) by mouth daily. 08/15/19   Molpus, John, MD  Probiotic Product (EQL PROBIOTIC COLON SUPPORT PO) Take 1 capsule by mouth daily.    [provider]  sertraline (ZOLOFT) 20 MG/ML concentrated solution Take 2.5 mLs (50 mg total) by mouth daily.  06/10/19   [name], NP  sucralfate (CARAFATE) 1 GM/10ML suspension Take 10 mLs (1 g total) by mouth 4 (four) times daily -  with meals and at bedtime. 08/15/19   Molpus, John, MD  warfarin (COUMADIN) 2.5 MG tablet TAKE 2-3 TABLETS BY MOUTH DAILY AS DIRECTED BY COUMADIN CLINIC. 08/25/19   Dorothy Spark, MD  Water For Irrigation, Sterile (FREE WATER) SOLN Place 180 mLs into feeding tube 3 (three) times daily.     [provider]  White Petrolatum-Mineral Oil (SYSTANE NIGHTTIME) OINT Place 1 application into both eyes at bedtime.     [provider]    Allergies    Penicillins  Review of Systems   Review of Systems  Constitutional: Negative for chills and fever.  HENT: Positive for hearing loss. Negative for congestion, rhinorrhea and sore throat.   Eyes: Negative for visual disturbance.  Respiratory: Positive for shortness of breath. Negative for cough.     Cardiovascular: Positive for chest pain. Negative for leg swelling.  Gastrointestinal: Negative for abdominal pain, diarrhea, nausea and vomiting.  Genitourinary: Negative for dysuria.  Musculoskeletal: Negative for back pain and neck pain.  Skin: Negative for rash.  Neurological: Positive for weakness. Negative for dizziness, light-headedness and headaches.  Hematological: Does not bruise/bleed easily.  Psychiatric/Behavioral: Negative for confusion.    Physical Exam Updated Vital Signs BP (!) 151/72 (BP Location: Left Arm)   Pulse 96   Temp 98.3 F (36.8 C) (Oral)   Resp (!) 24   SpO2 97%   Physical Exam Vitals and nursing note reviewed.  Constitutional:      General: She is not in acute distress.    Appearance: Normal appearance. She is well-developed.  HENT:     Head: Normocephalic and atraumatic.  Eyes:     Extraocular Movements: Extraocular movements intact.     Conjunctiva/sclera: Conjunctivae normal.     Pupils: Pupils are equal, round, and reactive to light.  Cardiovascular:     Rate and Rhythm: Normal rate and regular rhythm.     Heart sounds: No murmur.  Pulmonary:     Effort: Pulmonary effort is normal. No respiratory distress.     Breath sounds: Normal breath sounds.  Chest:     Chest wall: No tenderness.  Abdominal:     Palpations: Abdomen is soft.     Tenderness: There is no abdominal tenderness.  Musculoskeletal:        General: No swelling. Normal range of motion.     Cervical back: Normal range of motion and neck supple.  Skin:    General: Skin is warm and dry.     Capillary Refill: Capillary refill takes less than 2 seconds.  Neurological:     General: No focal deficit present.     Mental Status: She is alert and oriented to person, place, and time.     ED Results / Procedures / Treatments   Labs (all labs ordered are listed, but only abnormal results are displayed) Labs Reviewed  BASIC METABOLIC PANEL - Abnormal; Notable for the following  components:      Result Value   CO2 21 (*)    BUN 57 (*)    Creatinine, Ser 1.52 (*)    GFR calc non Af Amer 29 (*)    GFR calc Af Amer 34 (*)    All other components within normal limits  CBC - Abnormal; Notable for the following components:   RBC 2.83 (*)    Hemoglobin  8.8 (*)    HCT 26.3 (*)    nRBC 0.4 (*)    All other components within normal limits  TROPONIN I (HIGH SENSITIVITY) - Abnormal; Notable for the following components:   Troponin I (High Sensitivity) 191 (*)    All other components within normal limits  TROPONIN I (HIGH SENSITIVITY) - Abnormal; Notable for the following components:   Troponin I (High Sensitivity) 144 (*)    All other components within normal limits  SARS CORONAVIRUS 2 BY RT PCR [facility] ORDER, [place] LAB)  PROTIME-INR    EKG EKG Interpretation  Date/Time:  Wednesday Aug 27 2019 12:34:58 EDT Ventricular Rate:  55 PR Interval:    QRS Duration: 66 QT Interval:  412 QTC Calculation: 394 R Axis:   70 Text Interpretation: Atrial fibrillation with slow ventricular response Cannot rule out Anterior infarct , age undetermined Abnormal ECG Confirmed by Fredia Sorrow [phone]) on 08/27/2019 3:50:36 PM   Radiology DG Chest 2 View  Result Date: 08/27/2019 CLINICAL DATA:  Shortness of breath and chest pain EXAM: CHEST - 2 VIEW COMPARISON:  05/18/2019 FINDINGS: Cardiac shadow is mildly enlarged. Aortic calcifications are again seen. Lungs are hyperinflated without focal infiltrate or sizable effusion. Degenerative changes of the left shoulder joint are seen. Degenerative change of the thoracic spine is noted as well. IMPRESSION: No acute abnormality noted. Electronically Signed   By: Inez Catalina M.D.   On: 08/27/2019 12:57    Procedures Procedures (including critical care time)  Medications Ordered in ED Medications  sodium chloride flush (NS) 0.9 % injection 3 mL (has no administration in time range)    ED Course  I  have reviewed the triage vital signs and the nursing notes.  Pertinent labs & imaging results that were available during my care of the patient were reviewed by me and considered in my medical decision making (see chart for details).    MDM Rules/Calculators/A&P                      Patient with elevated troponin first 1 was 191.  Patient's troponins just in the beginning of April were more around 21.  Delta troponin came in at 144 which is down but patient is having intermittent chest pain feel that she needs admission for serial cardiac enzymes overnight to make sure that there is not an upward trend.  Patient subjectively talks about shortness of breath but no hypoxia.  Patient does not clinically appear to be in significant shortness of breath she is on Coumadin does have a history atrial fibrillation with a past history of DVTs.  Do not feel that we need to pursue work-up with CT angio.  Discussed with hospitalist who will admit. Final Clinical Impression(s) / ED Diagnoses Final diagnoses:  None    Rx / DC Orders ED Discharge Orders    None       Fredia Sorrow, MD 08/27/19 2038

## 2019-08-27 NOTE — ED Triage Notes (Signed)
Pt woke up felling weak and sob and having pain under left breast.  Pt has history of afib in the 40-50s. Hx of chf and takes fluid pills.  Pt received NS 250 en route to ED.

## 2019-08-27 NOTE — H&P (Signed)
History and Physical    Shannon [name]:[id number] DOB: July 25, 1923 DOA: 08/27/2019  PCP: Sharon Seller, NP  Patient coming from: Home  I have personally briefly reviewed patient's old medical records in [facility]  Chief Complaint: Chest pain  HPI: Shannon [name] is a 86 y.o. female with medical history significant for atrial fibrillation on Coumadin, chronic diastolic CHF (EF 65-78% by TTE 05/19/2019), insulin-dependent type 2 diabetes, history of CVA, CKD stage III, hypertension, hyperlipidemia, COPD, hypothyroidism, GERD, anemia of chronic disease, and gastric stenosis s/p GJ tube placement who presents to the ED for evaluation of chest pain.  Patient states she was in her usual state of health until this morning when she woke up with left-sided chest wall/axillary pain.  She was diaphoretic.  She had continued gastric tube feeding going overnight which he allowed to complete.  She checked her blood sugar and blood pressure which she states were not significantly abnormal.  She is feeling tired and fatigued and somewhat short of breath.  She called EMS and on their arrival she states they told her that her heart rate was low.  Per ED triage note she was given 250 cc normal saline on route to the ED.  ED Course:  Initial vitals showed BP 151/72, pulse 96, RR 24, temp 98.3 Fahrenheit, SPO2 97% on room air.  Labs are notable for sodium 135, potassium 4.5, bicarb 21, BUN 57, creatinine 1.52, WBC 4.6, hemoglobin 8.8, platelets 20 55,000.  High-sensitivity troponin I 191 >> 144.  SARS-CoV-2 PCR is obtained and pending.  2 view chest x-ray is negative for focal consolidation, edema, or effusion.  EKG showed atrial fibrillation with slow ventricular response.  The hospitalist service was consulted to admit for further evaluation management.  Review of Systems: All systems reviewed and are negative except as documented in history of present illness above.   Past Medical History:    Diagnosis Date  . Acute respiratory failure (HCC)   . Anemia    previous blood transfusions  . Arthritis    "all over"  . Asthma   . Atrial flutter (HCC)   . Bradycardia    requiring previous d/c of BB and reduction of amiodarone  . CAD (coronary artery disease)    nonobstructive per notes  . Chronic diastolic CHF (congestive heart failure) (HCC)   . CKD (chronic kidney disease), stage III   . Complication of blood transfusion    "got the wrong blood type at [facility] in ~ 2015; no adverse reaction that we are aware of"/daughter, [name] (01/27/2016)  . COPD (chronic obstructive pulmonary disease) (HCC)   . Depression    "light case"  . DVT (deep venous thrombosis) (HCC) 01/2016   a. LLE DVT 01/2016 - switched from Eliquis to Coumadin.  [name] Dyspnea    with some activity  . Gastric stenosis    a. s/p stomach tube  . GERD (gastroesophageal reflux disease)   . History of blood transfusion    "several" (01/27/2016)  . History of stomach ulcers   . Hyperlipidemia   . Hypertension   . Hypothyroidism   . On home oxygen therapy    "2L; 9PM til 9AM" (06/27/2017)  . PAD (peripheral artery disease) (HCC)    a. prior gangrene, toe amputations, intervention.  [name] PAF (paroxysmal atrial fibrillation) (HCC)   . Paraesophageal hernia   . Perforated gastric ulcer (HCC)   . Pneumonia    "a few times" (06/27/2017)  . Seasonal allergies   .  SIADH (syndrome of inappropriate ADH production) (HCC)    Shannon [name] 01/10/2015  . Small bowel obstruction (HCC)    "I don't know how many" (01/11/2015)  . Stroke [facility])    several- no residual  . Type II diabetes mellitus (HCC)    "related to prednisone use  for > 20 yr; once predinose stopped; no more DM RX" (01/27/2016)  . UTI (urinary tract infection) 02/08/2016  . Ventral hernia with bowel obstruction     Past Surgical History:  Procedure Laterality Date  . ABDOMINAL AORTOGRAM N/A 09/21/2016   Procedure: Abdominal Aortogram;  Surgeon: Maeola Harman, MD;  Location: [facility] INVASIVE CV LAB;  Service: Cardiovascular;  Laterality: N/A;  . AMPUTATION Left 09/25/2016   Procedure: AMPUTATION DIGIT- LEFT 2ND AND 3RD TOES;  Surgeon: Larina Earthly, MD;  Location: [facility] OR;  Service: Vascular;  Laterality: Left;  . AMPUTATION Right 05/03/2018   Procedure: AMPUTATION RIGHT GREAT TOE;  Surgeon: Larina Earthly, MD;  Location: [facility] OR;  Service: Vascular;  Laterality: Right;  . CATARACT EXTRACTION W/ INTRAOCULAR LENS  IMPLANT, BILATERAL    . CHOLECYSTECTOMY OPEN    . COLECTOMY    . ESOPHAGOGASTRODUODENOSCOPY N/A 01/19/2014   Procedure: ESOPHAGOGASTRODUODENOSCOPY (EGD);  Surgeon: Hilarie Fredrickson, MD;  Location: [facility] ENDOSCOPY;  Service: Endoscopy;  Laterality: N/A;  . ESOPHAGOGASTRODUODENOSCOPY N/A 01/20/2014   Procedure: ESOPHAGOGASTRODUODENOSCOPY (EGD);  Surgeon: Hilarie Fredrickson, MD;  Location: [facility] ENDOSCOPY;  Service: Endoscopy;  Laterality: N/A;  . ESOPHAGOGASTRODUODENOSCOPY N/A 03/19/2014   Procedure: ESOPHAGOGASTRODUODENOSCOPY (EGD);  Surgeon: Rachael Fee, MD;  Location: [facility] ENDOSCOPY;  Service: Endoscopy;  Laterality: N/A;  . ESOPHAGOGASTRODUODENOSCOPY N/A 07/08/2015   Procedure: ESOPHAGOGASTRODUODENOSCOPY (EGD);  Surgeon: Sherrilyn Rist, MD;  Location: [facility] ENDOSCOPY;  Service: Endoscopy;  Laterality: N/A;  . ESOPHAGOGASTRODUODENOSCOPY (EGD) WITH PROPOFOL N/A 09/15/2015   Procedure: ESOPHAGOGASTRODUODENOSCOPY (EGD) WITH PROPOFOL;  Surgeon: Ruffin Frederick, MD;  Location: [facility] ENDOSCOPY;  Service: Gastroenterology;  Laterality: N/A;  . GASTROJEJUNOSTOMY     hx/notes 01/10/2015  . GASTROJEJUNOSTOMY N/A 09/23/2015   Procedure: OPEN GASTROJEJUNOSTOMY TUBE PLACEMENT;  Surgeon: De Blanch Kinsinger, MD;  Location: [facility] ORS;  Service: General;  Laterality: N/A;  . GLAUCOMA SURGERY Bilateral   . HERNIA REPAIR  2015  . IR CM INJ ANY COLONIC TUBE W/FLUORO  01/05/2017  . IR CM INJ ANY COLONIC TUBE W/FLUORO  06/07/2017  . IR CM INJ ANY COLONIC TUBE W/FLUORO   11/05/2017  . IR CM INJ ANY COLONIC TUBE W/FLUORO  09/18/2018  . IR CM INJ ANY COLONIC TUBE W/FLUORO  03/06/2019  . IR CM INJ ANY COLONIC TUBE W/FLUORO  03/11/2019  . IR GENERIC HISTORICAL  01/07/2016   IR GJ TUBE CHANGE 01/07/2016 Malachy Moan, MD [facility]  . IR GENERIC HISTORICAL  01/27/2016   IR MECH REMOV OBSTRUC MAT ANY COLON TUBE W/FLUORO 01/27/2016 Richarda Overlie, MD [facility]  . IR GENERIC HISTORICAL  02/07/2016   IR PATIENT EVAL TECH 0-60 MINS Darrell K Allred, PA-C [facility]  . IR GENERIC HISTORICAL  02/08/2016   IR GJ TUBE CHANGE 02/08/2016 Berdine Dance, MD [facility]  . IR GENERIC HISTORICAL  01/06/2016   IR GJ TUBE CHANGE 01/06/2016 [facility] OUT REF  . IR GENERIC HISTORICAL  05/02/2016   IR CM INJ ANY COLONIC TUBE W/FLUORO 05/02/2016 Oley Balm, MD [facility]  . IR GENERIC HISTORICAL  05/15/2016   IR GJ TUBE CHANGE 05/15/2016 Simonne Come, MD [facility]  . IR GENERIC HISTORICAL  06/28/2016   IR GJ TUBE CHANGE 06/28/2016 [facility]  . IR GJ TUBE CHANGE  02/20/2017  . IR GJ TUBE CHANGE  05/10/2017  . IR GJ TUBE CHANGE  07/06/2017  . IR GJ TUBE CHANGE  08/02/2017  . IR GJ TUBE CHANGE  10/15/2017  . IR GJ TUBE CHANGE  12/26/2017  . IR GJ TUBE CHANGE  04/08/2018  . IR GJ TUBE CHANGE  06/19/2018  . IR GJ TUBE CHANGE  03/03/2019  . IR GJ TUBE CHANGE  06/06/2019  . IR PATIENT EVAL TECH 0-60 MINS  10/19/2016  . IR PATIENT EVAL TECH 0-60 MINS  12/25/2016  . IR PATIENT EVAL TECH 0-60 MINS  01/29/2017  . IR PATIENT EVAL TECH 0-60 MINS  04/04/2017  . IR PATIENT EVAL TECH 0-60 MINS  04/30/2017  . IR PATIENT EVAL TECH 0-60 MINS  07/31/2017  . IR REPLC DUODEN/JEJUNO TUBE PERCUT W/FLUORO  11/14/2016  . LAPAROTOMY N/A 01/20/2015   Procedure: EXPLORATORY LAPAROTOMY;  Surgeon: Abigail Miyamoto, MD;  Location: [facility] OR;  Service: General;  Laterality: N/A;  . LOWER EXTREMITY ANGIOGRAPHY Left 09/21/2016   Procedure: Lower Extremity Angiography;  Surgeon: Maeola Harman, MD;  Location: [facility]  INVASIVE CV LAB;  Service: Cardiovascular;  Laterality: Left;  . LOWER EXTREMITY ANGIOGRAPHY Right 06/27/2017   Procedure: Lower Extremity Angiography;  Surgeon: Maeola Harman, MD;  Location: [facility] INVASIVE CV LAB;  Service: Cardiovascular;  Laterality: Right;  . LYSIS OF ADHESION N/A 01/20/2015   Procedure: LYSIS OF ADHESIONS < 1 HOUR;  Surgeon: Abigail Miyamoto, MD;  Location: [facility] OR;  Service: General;  Laterality: N/A;  . PERIPHERAL VASCULAR BALLOON ANGIOPLASTY Left 09/21/2016   Procedure: Peripheral Vascular Balloon Angioplasty;  Surgeon: Maeola Harman, MD;  Location: [facility] INVASIVE CV LAB;  Service: Cardiovascular;  Laterality: Left;  drug coated balloon  . PERIPHERAL VASCULAR BALLOON ANGIOPLASTY Right 06/27/2017   Procedure: PERIPHERAL VASCULAR BALLOON ANGIOPLASTY;  Surgeon: Maeola Harman, MD;  Location: [facility] INVASIVE CV LAB;  Service: Cardiovascular;  Laterality: Right;  SFA/TPTRUNK  . TONSILLECTOMY    . TUBAL LIGATION    . VENTRAL HERNIA REPAIR  2015   incarcerated ventral hernia ([facility] 09/2013)/notes 01/10/2015    Social History:  reports that she has never smoked. She quit smokeless tobacco use about 39 years ago.  Her smokeless tobacco use included snuff. She reports that she does not drink alcohol or use drugs.  Allergies  Allergen Reactions  . Penicillins Itching, Rash and Other (See Comments)    Tolerated amoxicillin, unasyn, zosyn & cephalosporins in the past. Did it involve swelling of the face/tongue/throat, SOB, or low BP? No Did it involve sudden or severe rash/hives, skin peeling, or any reaction on the inside of your mouth or nose? No Did you need to seek medical attention at a hospital or doctor's office? Unknown When did it last happen?5+ years If all above answers are "NO", may proceed with cephalosporin use.     Family History  Problem Relation Age of Onset  . Stroke Mother   . Hypertension Mother   . Diabetes Brother   . Glaucoma  Son   . Glaucoma Son   . Heart attack Neg Hx   . Colon cancer Neg Hx   . Stomach cancer Neg Hx      Prior to Admission medications   Medication Sig Start Date End Date Taking? Authorizing Provider  acetaminophen (TYLENOL) 500 MG tablet Take 500 mg by mouth every 6 (six) hours as  needed (pain).     [provider]  Amino Acids-Protein Hydrolys (FEEDING SUPPLEMENT, PRO-STAT SUGAR FREE 64,) LIQD Take 30 mLs by mouth 2 (two) times daily.    [provider]  amLODipine (NORVASC) 5 MG tablet Take 1 tablet (5 mg total) by mouth daily. 11/26/18 11/26/19  Marzetta Board, MD  atorvastatin (LIPITOR) 10 MG tablet TAKE 1 TABLET BY MOUTH EVERY DAY 06/25/19   Sharon Seller, NP  b complex vitamins tablet Take 1 tablet by mouth daily.     [provider]  calcium-vitamin D (OSCAL WITH D) 500-200 MG-UNIT per tablet Take 1 tablet by mouth daily with breakfast.     [provider]  Carboxymethylcellulose Sodium (ARTIFICIAL TEARS OP) Place 1 drop into both eyes 4 (four) times daily.    [provider]  cycloSPORINE (RESTASIS) 0.05 % ophthalmic emulsion USE 1 DROP INTO BOTH EYES TWICE DAILY 01/29/17   Kirt Boys, DO  DULERA 200-5 MCG/ACT AERO TAKE TWO PUFFS BY MOUTH TWICE DAILY 01/13/19   Sharon Seller, NP  feeding supplement (OSMOLITE 1 CAL) LIQD Take 711 mLs by mouth See admin instructions. For 12 hours 2100 - 0900    [provider]  ferrous sulfate 220 (44 Fe) MG/5ML solution Take 7.4 mLs (325 mg total) by mouth daily. 06/10/19   Sharon Seller, NP  fluticasone (FLONASE) 50 MCG/ACT nasal spray SPRAY 2 SPRAYS INTO EACH NOSTRIL EVERY DAY 06/25/19   Sharon Seller, NP  furosemide (LASIX) 8 MG/ML solution 20 mg (2.5 ml) daily unless swelling occurs take 40 mg (5 ml) and then switch back to 20 mg daily 06/10/19   Sharon Seller, NP  gabapentin (NEURONTIN) 250 MG/5ML solution Take 6 mLs (300 mg total) by mouth 2 (two) times daily.  06/10/19   Sharon Seller, NP  gentamicin cream (GARAMYCIN) 0.1 % Apply 1 application topically 3 (three) times daily as needed (dry skin). 07/16/19   Felecia Shelling, DPM  hydrALAZINE (APRESOLINE) 50 MG tablet TAKE 1 TABLET BY MOUTH THREE TIMES A DAY 08/04/19   Lars Masson, MD  insulin aspart (NOVOLOG) 100 UNIT/ML FlexPen Inject 0-6 Units into the skin 3 (three) times daily with meals. New sliding Scale If BS is 151 - 251 = 2 units,  If BS is 251 -  350 = 4 units,  If BS is 351 - 450 = 6 units Call provider if BS runs more than 400 02/03/19   Sharon Seller, NP  Insulin Glargine (LANTUS SOLOSTAR) 100 UNIT/ML Solostar Pen Inject 8 Units into the skin at bedtime. 02/20/19   Sharon Seller, NP  Insulin Pen Needle (B-D UF III MINI PEN NEEDLES) 31G X 5 MM MISC Use twice daily with giving insulin injections. Dx E11.9 07/17/18   Sharon Seller, NP  ipratropium-albuterol (DUONEB) 0.5-2.5 (3) MG/3ML SOLN Take 3 mLs by nebulization every 6 (six) hours as needed (sob). Use 3 times daily x 4 days then every 6 hours as needed. 03/21/16   Janetta Hora, PA-C  isosorbide mononitrate (IMDUR) 30 MG 24 hr tablet TAKE 2 TABLETS BY MOUTH DAILY. PLEASE MAKE ANNUAL APPT WITH DR. Delton See FOR FUTURE REFILLS. THANK YOU. 1ST ATTEMPT. 12/27/18   Lars Masson, MD  latanoprost (XALATAN) 0.005 % ophthalmic solution Place 1 drop into both eyes at bedtime. 06/02/14   Oneal Grout, MD  levalbuterol [facility] HFA) 45 MCG/ACT inhaler Inhale 2 puffs into the lungs every 6 (six) hours as needed for  wheezing or shortness of breath. 02/03/16   Kirt Boys, DO  levothyroxine (SYNTHROID) 125 MCG tablet TAKE 1 TABLET BY MOUTH EVERY DAY 03/18/19   Sharon Seller, NP  loratadine (CLARITIN) 10 MG tablet Take 10 mg by mouth daily as needed for allergies.    [provider]  Multiple Vitamin (MULTIVITAMIN WITH MINERALS) TABS tablet Take 1 tablet by mouth daily.    [provider]  nystatin  (MYCOSTATIN/NYSTOP) powder Apply 1 g topically as needed (skin irritation).     [provider]  oxyCODONE (ROXICODONE) 5 MG immediate release tablet Take 1 tablet (5 mg total) by mouth every 4 (four) hours as needed for severe pain. May take 1/2 tab q4hrs as needed for mild pain. 08/11/19   Sharon Seller, NP  pantoprazole (PROTONIX) 40 MG tablet Take 1 tablet (40 mg total) by mouth daily. 08/15/19   Molpus, John, MD  Probiotic Product (EQL PROBIOTIC COLON SUPPORT PO) Take 1 capsule by mouth daily.    [provider]  sertraline (ZOLOFT) 20 MG/ML concentrated solution Take 2.5 mLs (50 mg total) by mouth daily. 06/10/19   Sharon Seller, NP  sucralfate (CARAFATE) 1 GM/10ML suspension Take 10 mLs (1 g total) by mouth 4 (four) times daily -  with meals and at bedtime. 08/15/19   Molpus, John, MD  warfarin (COUMADIN) 2.5 MG tablet TAKE 2-3 TABLETS BY MOUTH DAILY AS DIRECTED BY COUMADIN CLINIC. 08/25/19   Lars Masson, MD  Water For Irrigation, Sterile (FREE WATER) SOLN Place 180 mLs into feeding tube 3 (three) times daily.     [provider]  White Petrolatum-Mineral Oil (SYSTANE NIGHTTIME) OINT Place 1 application into both eyes at bedtime.     [provider]    Physical Exam: Vitals:   08/27/19 2300 08/27/19 2315 08/27/19 2330 08/27/19 2345  BP: (!) 169/53 (!) 158/52 (!) 158/51 (!) 161/55  Pulse: 65 63 72 66  Resp: 15 16 (!) 25 14  Temp:      TempSrc:      SpO2: 99% 98% 94% 96%   Constitutional: Elderly woman resting supine in bed, NAD, calm, comfortable Eyes: PERRL, lids and conjunctivae normal ENMT: Mucous membranes are moist. Posterior pharynx clear of any exudate or lesions.  Neck: normal, supple, no masses. Respiratory: clear to auscultation anteriorly. Normal respiratory effort. No accessory muscle use.  Cardiovascular: Irregularly irregular rhythm, no murmurs / rubs / gallops. No extremity edema. Abdomen: GJ tube in place left abdomen, no  tenderness, no masses palpated. No hepatosplenomegaly.  Musculoskeletal: no clubbing / cyanosis. No joint deformity upper and lower extremities. Good ROM, no contractures. Normal muscle tone.  Skin: no rashes, lesions, ulcers. No induration Neurologic: CN 2-12 grossly intact. Sensation intact, Strength 5/5 in all 4.  Psychiatric: Normal judgment and insight. Alert and oriented x 3. Normal mood.     Labs on Admission: I have personally reviewed following labs and imaging studies  CBC: Recent Labs  Lab 08/27/19 1239  WBC 4.6  HGB 8.8*  HCT 26.3*  MCV 92.9  PLT 255   Basic Metabolic Panel: Recent Labs  Lab 08/27/19 1239 08/27/19 2016  NA 135  --   K 4.5  --   CL 99  --   CO2 21*  --   GLUCOSE 96  --   BUN 57*  --   CREATININE 1.52*  --   CALCIUM 9.2  --   MG  --  2.3   GFR: CrCl cannot  be calculated (Unknown ideal weight.). Liver Function Tests: No results for input(s): AST, ALT, ALKPHOS, BILITOT, PROT, ALBUMIN in the last 168 hours. No results for input(s): LIPASE, AMYLASE in the last 168 hours. No results for input(s): AMMONIA in the last 168 hours. Coagulation Profile: Recent Labs  Lab 08/27/19 2016  INR 2.6*   Cardiac Enzymes: No results for input(s): CKTOTAL, CKMB, CKMBINDEX, TROPONINI in the last 168 hours. BNP (last 3 results) No results for input(s): PROBNP in the last 8760 hours. HbA1C: No results for input(s): HGBA1C in the last 72 hours. CBG: Recent Labs  Lab 08/27/19 2226  GLUCAP 86   Lipid Profile: No results for input(s): CHOL, HDL, LDLCALC, TRIG, CHOLHDL, LDLDIRECT in the last 72 hours. Thyroid Function Tests: No results for input(s): TSH, T4TOTAL, FREET4, T3FREE, THYROIDAB in the last 72 hours. Anemia Panel: No results for input(s): VITAMINB12, FOLATE, FERRITIN, TIBC, IRON, RETICCTPCT in the last 72 hours. Urine analysis:    Component Value Date/Time   COLORURINE YELLOW 07/07/2019 0310   APPEARANCEUR CLEAR 07/07/2019 0310   LABSPEC  1.011 07/07/2019 0310   PHURINE 8.0 07/07/2019 0310   GLUCOSEU NEGATIVE 07/07/2019 0310   HGBUR NEGATIVE 07/07/2019 0310   BILIRUBINUR NEGATIVE 07/07/2019 0310   BILIRUBINUR Neg 11/01/2017 1228   KETONESUR NEGATIVE 07/07/2019 0310   PROTEINUR NEGATIVE 07/07/2019 0310   UROBILINOGEN 0.2 11/01/2017 1228   UROBILINOGEN 0.2 01/27/2015 2308   NITRITE NEGATIVE 07/07/2019 0310   LEUKOCYTESUR NEGATIVE 07/07/2019 0310    Radiological Exams on Admission: DG Chest 2 View  Result Date: 08/27/2019 CLINICAL DATA:  Shortness of breath and chest pain EXAM: CHEST - 2 VIEW COMPARISON:  05/18/2019 FINDINGS: Cardiac shadow is mildly enlarged. Aortic calcifications are again seen. Lungs are hyperinflated without focal infiltrate or sizable effusion. Degenerative changes of the left shoulder joint are seen. Degenerative change of the thoracic spine is noted as well. IMPRESSION: No acute abnormality noted. Electronically Signed   By: Alcide Clever M.D.   On: 08/27/2019 12:57    EKG: Independently reviewed. Atrial fibrillation with slow ventricular response, rate 48 bpm.  Rate is slower when compared to prior.  Assessment/Plan Principal Problem:   Chest pain Active Problems:   Hyperlipidemia associated with type 2 diabetes mellitus (HCC)   Type 2 diabetes mellitus with chronic kidney disease (HCC)   Persistent atrial fibrillation   Hypothyroidism   Anemia of chronic disease   CKD (chronic kidney disease) stage 3, GFR 30-59 ml/min   Hypertension associated with diabetes (HCC)   Chronic diastolic CHF (congestive heart failure) (HCC)   PEG (percutaneous endoscopic gastrostomy) status (HCC)   Bradycardia   Lab test positive for detection of COVID-19 virus  Shannon [name] is a 86 y.o. female with medical history significant for atrial fibrillation on Coumadin, chronic diastolic CHF (EF 55-73% by TTE 05/19/2019), insulin-dependent type 2 diabetes, history of CVA, CKD stage III, hypertension, hyperlipidemia,  COPD, hypothyroidism, GERD, anemia of chronic disease, and gastric stenosis s/p GJ tube placement who is admitted for chest pain rule out.  Chest pain with elevated troponin: Patient with left-sided chest pain and mildly elevated high-sensitivity troponin I 191.  Repeat is trending down at 144.  Initial EKG showed A. fib with slow ventricular response.  -Monitor on telemetry -Continue to cycle cardiac enzymes -Not on beta-blocker due to history of bradycardia -Resume home Imdur and statin  Atrial fibrillation with slow ventricular response/bradycardia: In and out of A. fib and sinus rhythm at time of admission.  Had couple  episodes of bradycardia which have been noted in the past. -Continue Coumadin per pharmacy -Not on rate control due to bradycardia  Chronic diastolic CHF: EF 82-95% by TTE 05/19/2019.  Appears stable and compensated.  Continue home Lasix and monitor strict I/O's/daily weights.  Type 2 diabetes: Placed on very sensitive SSI with hypoglycemia protocol.  Adjust as needed.  Anemia of chronic disease: Chronic and stable without obvious bleeding.  Continue to monitor.  Hypertension: Continue home hydralazine, Imdur, Lasix.  Holding amlodipine for now.  Hyperlipidemia: Continue atorvastatin.  Hypothyroidism: Continue Synthroid, check TSH.  CKD stage III: Chronic and appears stable.  Continue to monitor.  Gastric stenosis s/p GJ tube placement: Continue tube care and home feeding supplement.  COPD: Chronic and stable.  Continue Dulera and as needed Xopenex.  Positive COVID-19 test: Noted positive screen SARS-CoV-2 PCR test at time of admission.  No prior positive test.  She is oxygenating well on room air with clear chest x-ray therefore we will not start on IV steroids or remdesivir at this time.  Continue precautions.  DVT prophylaxis: Coumadin Code Status: DNR, confirmed with patient Family Communication: Discussed with patient's daughter at  bedside Disposition Plan: From home and likely discharge to home pending chest pain rule out. Consults called: None Admission status:  Status is: Observation  The patient remains OBS appropriate and will d/c before 2 midnights.  Dispo: The patient is from: Home              Anticipated d/c is to: Home              Anticipated d/c date is: 1 day any chest pain rule out.              Patient currently is not medically stable to d/c.  Darreld Mclean MD [facility]  If 7PM-7AM, please contact night-coverage [email or web address]  08/27/2019, 11:56 PM

## 2019-08-27 NOTE — Telephone Encounter (Signed)
New Message  Patient c/o Palpitations:  High priority if patient c/o lightheadedness, shortness of breath, or chest pain  1) How long have you had palpitations/irregular HR/ Afib? Are you having the symptoms now? Yes  2) Are you currently experiencing lightheadedness, SOB or CP? Lightheadedness, SOB, CP  3) Do you have a history of afib (atrial fibrillation) or irregular heart rhythm? Yes  4) Have you checked your BP or HR? (document readings if available): 127/47; 79  5) Are you experiencing any other symptoms? Passing out

## 2019-08-27 NOTE — ED Notes (Signed)
Soledad Gerlach (Daughter#[phone]) called to check on status of her mother.  Thank you.

## 2019-08-27 NOTE — Progress Notes (Signed)
ANTICOAGULATION CONSULT NOTE - Initial Consult  Pharmacy Consult for Warfarin Indication: atrial fibrillation  Allergies  Allergen Reactions  . Penicillins Itching, Rash and Other (See Comments)    Tolerated amoxicillin, unasyn, zosyn & cephalosporins in the past. Did it involve swelling of the face/tongue/throat, SOB, or low BP? No Did it involve sudden or severe rash/hives, skin peeling, or any reaction on the inside of your mouth or nose? No Did you need to seek medical attention at a hospital or doctor's office? Unknown When did it last happen?5+ years If all above answers are "NO", may proceed with cephalosporin use.     Patient Measurements:     Vital Signs: Temp: 97.9 F (36.6 C) (05/26 2028) Temp Source: Oral (05/26 2028) BP: 172/55 (05/26 2045) Pulse Rate: 67 (05/26 2100)  Labs: Recent Labs    08/27/19 1239 08/27/19 1535 08/27/19 2016  HGB 8.8*  --   --   HCT 26.3*  --   --   PLT 255  --   --   LABPROT  --   --  27.0*  INR  --   --  2.6*  CREATININE 1.52*  --   --   TROPONINIHS 191* 144*  --     CrCl cannot be calculated (Unknown ideal weight.).   Medical History: Past Medical History:  Diagnosis Date  . Acute respiratory failure ([place])   . Anemia    previous blood transfusions  . Arthritis    "all over"  . Asthma   . Atrial flutter ([place])   . Bradycardia    requiring previous d/c of BB and reduction of amiodarone  . CAD (coronary artery disease)    nonobstructive per notes  . Chronic diastolic CHF (congestive heart failure) ([place])   . CKD (chronic kidney disease), stage III   . Complication of blood transfusion    "got the wrong blood type at [facility] in ~ 2015; no adverse reaction that we are aware of"/daughter, [name] (01/27/2016)  . COPD (chronic obstructive pulmonary disease) ([place])   . Depression    "light case"  . DVT (deep venous thrombosis) ([place]) 01/2016   a. LLE DVT 01/2016 - switched from Eliquis to Coumadin.  [name] Dyspnea    with some activity  . Gastric stenosis    a. s/p stomach tube  . GERD (gastroesophageal reflux disease)   . History of blood transfusion    "several" (01/27/2016)  . History of stomach ulcers   . Hyperlipidemia   . Hypertension   . Hypothyroidism   . On home oxygen therapy    "2L; 9PM til 9AM" (06/27/2017)  . PAD (peripheral artery disease) ([place])    a. prior gangrene, toe amputations, intervention.  [name] PAF (paroxysmal atrial fibrillation) ([place])   . Paraesophageal hernia   . Perforated gastric ulcer ([place])   . Pneumonia    "a few times" (06/27/2017)  . Seasonal allergies   . SIADH (syndrome of inappropriate ADH production) ([place])    [name] 01/10/2015  . Small bowel obstruction ([place])    "I don't know how many" (01/11/2015)  . Stroke [facility])    several- no residual  . Type II diabetes mellitus ([place])    "related to prednisone use  for > 20 yr; once predinose stopped; no more DM RX" (01/27/2016)  . UTI (urinary tract infection) 02/08/2016  . Ventral hernia with bowel obstruction    Assessment: 86 year old female presenting to ED 5/26 with weakness, shortness of breath and chest pain.  Patient has a history of atrial fibrillation on warfarin. Pharmacy consulted to continue warfarin for atrial fibrillation.  Home warfarin regimen: 7.5 mg on Thursday and Sunday, 5 mg all other days.  Last dose of warfarin 08/26/19 evening. INR currently therapeutic at 2.6, hemoglobin 8.8 (baseline ~10.0). No bleeding reported.  Goal of Therapy:  INR 2-3 Monitor platelets by anticoagulation protocol: Yes   Plan:  - Warfarin 5 mg PO x1 tonight - Daily INR and CBC - Monitor for signs and symptoms of bleeding     Kessler Kopinski L. Devin Going, [place] PGY1 Pharmacy Resident [phone] 08/27/19      9:07 PM  Please check AMION for all [place] phone numbers After 10:00 PM, call the [facility] [phone]

## 2019-08-27 NOTE — ED Notes (Signed)
Pt showed to be bradycardic 40-50's. 12 Lead EKG obtained with new pressures taken. MD notified. Pt did returned to 60's upon checking on her after seeing the monitor from the  Nurses station. Pt aleert and oriented and denies pain.

## 2019-08-27 NOTE — Telephone Encounter (Signed)
Dtr calls in concerning mom.  Pt is sitting next to dtr and ok to speak w/ dtr.  Reports mom is in AFib, has been for awhile according to dtr.  Reports HRs this morning jumping from 40s - 110s.  Also states that O2 dropped to 68. Pt experiencing CP and SOB. Denies any breathing distress and explained that I am not sure that pulse ox reading of 68 is correct and gave rationale. Dtr feels like it would be best for mom to go to ED.  Advised to have pt seen at ED d/t reported CP.  Informed that I feel like CP is r/t AFib but can't say for sure and best for pt to be evaluated.  Dtr is agreeable to will have pt transported by EMS.

## 2019-08-28 ENCOUNTER — Other Ambulatory Visit: Payer: Self-pay | Admitting: *Deleted

## 2019-08-28 DIAGNOSIS — R001 Bradycardia, unspecified: Secondary | ICD-10-CM | POA: Diagnosis not present

## 2019-08-28 DIAGNOSIS — R0789 Other chest pain: Secondary | ICD-10-CM | POA: Diagnosis not present

## 2019-08-28 DIAGNOSIS — D638 Anemia in other chronic diseases classified elsewhere: Secondary | ICD-10-CM

## 2019-08-28 DIAGNOSIS — I5032 Chronic diastolic (congestive) heart failure: Secondary | ICD-10-CM | POA: Diagnosis not present

## 2019-08-28 LAB — BASIC METABOLIC PANEL
Anion gap: 11 (ref 5–15)
BUN: 44 mg/dL — ABNORMAL HIGH (ref 8–23)
CO2: 23 mmol/L (ref 22–32)
Calcium: 9.7 mg/dL (ref 8.9–10.3)
Chloride: 103 mmol/L (ref 98–111)
Creatinine, Ser: 1.34 mg/dL — ABNORMAL HIGH (ref 0.44–1.00)
GFR calc Af Amer: 39 mL/min — ABNORMAL LOW (ref >60)
GFR calc non Af Amer: 34 mL/min — ABNORMAL LOW (ref >60)
Glucose, Bld: 93 mg/dL (ref 70–99)
Potassium: 4.6 mmol/L (ref 3.5–5.1)
Sodium: 137 mmol/L (ref 135–145)

## 2019-08-28 LAB — MRSA PCR SCREENING: MRSA by PCR: NEGATIVE

## 2019-08-28 LAB — CBC
HCT: 28.4 % — ABNORMAL LOW (ref 36.0–46.0)
Hemoglobin: 9.5 g/dL — ABNORMAL LOW (ref 12.0–15.0)
MCH: 30.6 pg (ref 26.0–34.0)
MCHC: 33.5 g/dL (ref 30.0–36.0)
MCV: 91.6 fL (ref 80.0–100.0)
Platelets: 264 10*3/uL (ref 150–400)
RBC: 3.1 MIL/uL — ABNORMAL LOW (ref 3.87–5.11)
RDW: 15.2 % (ref 11.5–15.5)
WBC: 4.7 10*3/uL (ref 4.0–10.5)
nRBC: 0 % (ref 0.0–0.2)

## 2019-08-28 LAB — PROTIME-INR
INR: 2.4 — ABNORMAL HIGH (ref 0.8–1.2)
Prothrombin Time: 25.1 seconds — ABNORMAL HIGH (ref 11.4–15.2)

## 2019-08-28 LAB — TSH: TSH: 1.428 u[IU]/mL (ref 0.350–4.500)

## 2019-08-28 LAB — GLUCOSE, CAPILLARY
Glucose-Capillary: 124 mg/dL — ABNORMAL HIGH (ref 70–99)
Glucose-Capillary: 89 mg/dL (ref 70–99)
Glucose-Capillary: 99 mg/dL (ref 70–99)

## 2019-08-28 LAB — TROPONIN I (HIGH SENSITIVITY): Troponin I (High Sensitivity): 87 ng/L — ABNORMAL HIGH (ref <18)

## 2019-08-28 MED ORDER — AMLODIPINE BESYLATE 10 MG PO TABS
10.0000 mg | ORAL_TABLET | Freq: Every day | ORAL | Status: DC
  Administered 2019-08-28: 10 mg via ORAL
  Filled 2019-08-28: qty 1

## 2019-08-28 MED ORDER — OSMOLITE 1.2 CAL PO LIQD
720.0000 mL | ORAL | Status: DC
  Filled 2019-08-28: qty 948

## 2019-08-28 MED ORDER — ORAL CARE MOUTH RINSE
15.0000 mL | Freq: Two times a day (BID) | OROMUCOSAL | Status: DC
  Administered 2019-08-28: 15 mL via OROMUCOSAL

## 2019-08-28 NOTE — Progress Notes (Signed)
D/c paperwork provided to daughter and patient. There are no questions at this time. Daughter to pick pt up.

## 2019-08-28 NOTE — Progress Notes (Signed)
Pt taken down in w/c with daughter for d/c.

## 2019-08-28 NOTE — H&P (Signed)
Pt deemed stable for discharge home today.  CM reviewed pt's chart for TOC consults/orders - none found.  CM confirmed with pt's daughter [name] that pt is cared for by herself in the home.  [name] denied needing any equipment or HH.  Daughter will transport pt home at discharge. Pt has PCP  Discharge orders signed.  CM signing off

## 2019-08-28 NOTE — Discharge Summary (Signed)
Shannon [name] YNW:[id number] DOB: 1923/05/26 DOA: 08/27/2019  PCP: Sharon Seller, NP  Admit date: 08/27/2019  Discharge date: 08/28/2019  Admitted From: Home   Disposition:  Home   Recommendations for Outpatient Follow-up:   Follow up with PCP in 1-2 weeks  PCP Please obtain BMP/CBC, 2 view CXR in 1week,  (see Discharge instructions)   PCP Please follow up on the following pending results:    Home Health: None   Equipment/Devices: None  Consultations: None  Discharge Condition: Stable    CODE STATUS: Full    Diet Recommendation: Full liquids by mouth, feeding supplements via PEG tube as before  Diet Order            Diet full liquid Room service appropriate? Yes; Fluid consistency: Thin  Diet effective now               Chief Complaint  Patient presents with  . Weakness  . Shortness of Breath     Brief history of present illness from the day of admission and additional interim summary    Shannon [name] is a 86 y.o. female with medical history significant for atrial fibrillation on Coumadin, chronic diastolic CHF (EF 65-78% by TTE 05/19/2019), insulin-dependent type 2 diabetes, history of CVA, CKD stage III, hypertension, hyperlipidemia, COPD, hypothyroidism, GERD, anemia of chronic disease, and gastric stenosis s/p GJ tube placement who presents to the ED for evaluation of chest pain.                                                                 Hospital Course     1.  Musculoskeletal chest pain with non-ACS pattern downtrending high-sensitivity troponin level, nonacute EKG, patient is 86 years old and not a candidate for invasive testing or intervention.  Already on maximal treatment which includes Imdur, statin and Coumadin with therapeutic INR.  Currently completely chest pain-free.  Will be  discharged home on home medications with PCP and primary cardiology follow-up.  2.  Incidental COVID-19 infection.  She has had one COVID-19 vaccine, her granddaughter had COVID-19 few weeks ago, she has a negative chest x-ray, no hypoxia, clinically completely stable.  No Covid specific treatment required for now.  3.  Possible A. fib with variable heart rate and episodes of bradycardia.  Continue home medications.  [place] vas 2 score is greater than 3 and she is on Midrin already which will be continued.  PCP to monitor INR.  4.  Diastolic CHF EF 65%.  Compensated continue home medications unchanged.  Which include DM type II, anemia of chronic disease and hypertension are stable she will continue home medications unchanged.  Discharge diagnosis     Principal Problem:   Chest pain Active Problems:   Hyperlipidemia associated with type 2 diabetes mellitus (HCC)  Type 2 diabetes mellitus with chronic kidney disease (HCC)   Persistent atrial fibrillation   Hypothyroidism   Anemia of chronic disease   CKD (chronic kidney disease) stage 3, GFR 30-59 ml/min   Hypertension associated with diabetes (HCC)   Chronic diastolic CHF (congestive heart failure) (HCC)   PEG (percutaneous endoscopic gastrostomy) status (HCC)   Bradycardia   Lab test positive for detection of COVID-19 virus    Discharge instructions    Discharge Instructions    Discharge instructions   Complete by: As directed    Follow with Primary MD Sharon Seller, NP in 7 days   Get CBC, CMP, 2 view Chest X ray -  checked next visit within 1 week by Primary MD    Activity: As tolerated with Full fall precautions use walker/cane & assistance as needed  Disposition Home   Diet: Full liquid diet by mouth, feeding supplements via PEG tube as before.   Special Instructions: If you have smoked or chewed Tobacco  in the last 2 yrs please stop smoking, stop any regular Alcohol  and or any Recreational drug use.  On your  next visit with your primary care physician please Get Medicines reviewed and adjusted.  Please request your Prim.MD to go over all Hospital Tests and Procedure/Radiological results at the follow up, please get all Hospital records sent to your Prim MD by signing hospital release before you go home.  If you experience worsening of your admission symptoms, develop shortness of breath, life threatening emergency, suicidal or homicidal thoughts you must seek medical attention immediately by calling 911 or calling your MD immediately  if symptoms less severe.  You Must read complete instructions/literature along with all the possible adverse reactions/side effects for all the Medicines you take and that have been prescribed to you. Take any new Medicines after you have completely understood and accpet all the possible adverse reactions/side effects.   Increase activity slowly   Complete by: As directed       Discharge Medications   Allergies as of 08/28/2019      Reactions   Penicillins Itching, Rash, Other (See Comments)   Tolerated amoxicillin, unasyn, zosyn & cephalosporins in the past. Did it involve swelling of the face/tongue/throat, SOB, or low BP? No Did it involve sudden or severe rash/hives, skin peeling, or any reaction on the inside of your mouth or nose? No Did you need to seek medical attention at a hospital or doctor's office? Unknown When did it last happen?5+ years If all above answers are "NO", may proceed with cephalosporin use.      Medication List    TAKE these medications   acetaminophen 500 MG tablet Commonly known as: TYLENOL Take 500 mg by mouth every 6 (six) hours as needed (pain).   amLODipine 5 MG tablet Commonly known as: NORVASC Take 1 tablet (5 mg total) by mouth daily.   ARTIFICIAL TEARS OP Place 1 drop into both eyes 4 (four) times daily.   atorvastatin 10 MG tablet Commonly known as: LIPITOR TAKE 1 TABLET BY MOUTH EVERY DAY   b complex  vitamins tablet Take 1 tablet by mouth daily.   calcium-vitamin D 500-200 MG-UNIT tablet Commonly known as: OSCAL WITH D Take 1 tablet by mouth daily with breakfast.   cycloSPORINE 0.05 % ophthalmic emulsion Commonly known as: Restasis USE 1 DROP INTO BOTH EYES TWICE DAILY   Dulera 200-5 MCG/ACT Aero Generic drug: mometasone-formoterol TAKE TWO PUFFS BY MOUTH TWICE DAILY   EQL  PROBIOTIC COLON SUPPORT PO Take 1 capsule by mouth daily.   feeding supplement (PRO-STAT SUGAR FREE 64) Liqd Take 30 mLs by mouth 2 (two) times daily.   feeding supplement Liqd Take 711 mLs by mouth See admin instructions. For 12 hours 2100 - 0900   ferrous sulfate 220 (44 Fe) MG/5ML solution Take 7.4 mLs (325 mg total) by mouth daily.   fluticasone 50 MCG/ACT nasal spray Commonly known as: FLONASE SPRAY 2 SPRAYS INTO EACH NOSTRIL EVERY DAY   free water Soln Place 180 mLs into feeding tube 3 (three) times daily.   furosemide 8 MG/ML solution Commonly known as: LASIX 20 mg (2.5 ml) daily unless swelling occurs take 40 mg (5 ml) and then switch back to 20 mg daily   gabapentin 250 MG/5ML solution Commonly known as: NEURONTIN Take 6 mLs (300 mg total) by mouth 2 (two) times daily.   gentamicin cream 0.1 % Commonly known as: GARAMYCIN Apply 1 application topically 3 (three) times daily as needed (dry skin).   hydrALAZINE 50 MG tablet Commonly known as: APRESOLINE TAKE 1 TABLET BY MOUTH THREE TIMES A DAY   insulin aspart 100 UNIT/ML FlexPen Commonly known as: NOVOLOG Inject 0-6 Units into the skin 3 (three) times daily with meals. New sliding Scale If BS is 151 - 251 = 2 units,  If BS is 251 -  350 = 4 units,  If BS is 351 - 450 = 6 units Call provider if BS runs more than 400   Insulin Pen Needle 31G X 5 MM Misc Commonly known as: B-D UF III MINI PEN NEEDLES Use twice daily with giving insulin injections. Dx E11.9   ipratropium-albuterol 0.5-2.5 (3) MG/3ML Soln Commonly known as:  DUONEB Take 3 mLs by nebulization every 6 (six) hours as needed (sob). Use 3 times daily x 4 days then every 6 hours as needed.   isosorbide mononitrate 30 MG 24 hr tablet Commonly known as: IMDUR TAKE 2 TABLETS BY MOUTH DAILY. PLEASE MAKE ANNUAL APPT WITH DR. Delton See FOR FUTURE REFILLS. THANK YOU. 1ST ATTEMPT.   Lantus SoloStar 100 UNIT/ML Solostar Pen Generic drug: insulin glargine Inject 8 Units into the skin at bedtime.   latanoprost 0.005 % ophthalmic solution Commonly known as: XALATAN Place 1 drop into both eyes at bedtime.   levalbuterol 45 MCG/ACT inhaler Commonly known as: Xopenex HFA Inhale 2 puffs into the lungs every 6 (six) hours as needed for wheezing or shortness of breath.   levothyroxine 125 MCG tablet Commonly known as: SYNTHROID TAKE 1 TABLET BY MOUTH EVERY DAY   loratadine 10 MG tablet Commonly known as: CLARITIN Take 10 mg by mouth daily as needed for allergies.   multivitamin with minerals Tabs tablet Take 1 tablet by mouth daily.   nystatin powder Commonly known as: MYCOSTATIN/NYSTOP Apply 1 g topically as needed (skin irritation).   oxyCODONE 5 MG immediate release tablet Commonly known as: Roxicodone Take 1 tablet (5 mg total) by mouth every 4 (four) hours as needed for severe pain. May take 1/2 tab q4hrs as needed for mild pain.   pantoprazole 40 MG tablet Commonly known as: Protonix Take 1 tablet (40 mg total) by mouth daily.   sertraline 20 MG/ML concentrated solution Commonly known as: ZOLOFT Take 2.5 mLs (50 mg total) by mouth daily.   sucralfate 1 GM/10ML suspension Commonly known as: Carafate Take 10 mLs (1 g total) by mouth 4 (four) times daily -  with meals and at bedtime.   Systane Nighttime  Oint Place 1 application into both eyes at bedtime.   warfarin 2.5 MG tablet Commonly known as: COUMADIN Take as directed. If you are unsure how to take this medication, talk to your nurse or doctor. Original instructions: TAKE 2-3 TABLETS  BY MOUTH DAILY AS DIRECTED BY COUMADIN CLINIC.       Follow-up Information    Sharon Seller, NP. Schedule an appointment as soon as possible for a visit in 1 week(s).   Specialty: Geriatric Medicine Contact information: [address] [place] [address] [phone]        Lars Masson, MD. Schedule an appointment as soon as possible for a visit in 1 week(s).   Specialty: Cardiology Contact information: [address] [place] [address] [phone]           Major procedures and Radiology Reports - PLEASE review detailed and final reports thoroughly  -       DG Chest 2 View  Result Date: 08/27/2019 CLINICAL DATA:  Shortness of breath and chest pain EXAM: CHEST - 2 VIEW COMPARISON:  05/18/2019 FINDINGS: Cardiac shadow is mildly enlarged. Aortic calcifications are again seen. Lungs are hyperinflated without focal infiltrate or sizable effusion. Degenerative changes of the left shoulder joint are seen. Degenerative change of the thoracic spine is noted as well. IMPRESSION: No acute abnormality noted. Electronically Signed   By: Alcide Clever M.D.   On: 08/27/2019 12:57    Micro Results     Recent Results (from the past 240 hour(s))  SARS Coronavirus 2 by RT PCR (hospital order, performed in [facility] hospital lab) Nasopharyngeal Nasopharyngeal Swab     Status: Abnormal   Collection Time: 08/27/19  6:38 PM   Specimen: Nasopharyngeal Swab  Result Value Ref Range Status   SARS Coronavirus 2 POSITIVE (A) NEGATIVE Final    Comment: RESULT CALLED TO, READ BACK BY AND VERIFIED WITH: A BARWICK RN 08/27/19 2057 JDW (NOTE) SARS-CoV-2 target nucleic acids are DETECTED SARS-CoV-2 RNA is generally detectable in upper respiratory specimens  during the acute phase of infection.  Positive results are indicative  of the presence of the identified virus, but do not rule out bacterial infection or co-infection with other pathogens not detected by the  test.  Clinical correlation with patient history and  other diagnostic information is necessary to determine patient infection status.  The expected result is negative. Fact Sheet for Patients:   BoilerBrush.com.cy  Fact Sheet for Healthcare Providers:   https://pope.com/   This test is not yet approved or cleared by the [place] FDA and  has been authorized for detection and/or diagnosis of SARS-CoV-2 by FDA under an Emergency Use Authorization (EUA).  This EUA will remain in effect (meaning this test can be use d) for the duration of  the COVID-19 declaration under Section 564(b)(1) of the Act, 21 U.S.C. section 360-bbb-3(b)(1), unless the authorization is terminated or revoked sooner. Performed at [facility], [address], [place], [place] [address]   MRSA PCR Screening     Status: None   Collection Time: 08/28/19 12:45 AM   Specimen: Nasal Mucosa; Nasopharyngeal  Result Value Ref Range Status   MRSA by PCR NEGATIVE NEGATIVE Final    Comment:        The GeneXpert MRSA Assay (FDA approved for NASAL specimens only), is one component of a comprehensive MRSA colonization surveillance program. It is not intended to diagnose MRSA infection nor to guide or monitor treatment for  MRSA infections. Performed at [facility], [address], [place], [place] [address]     Today   Subjective    Shannon [name] today has no headache,no chest abdominal pain,no new weakness tingling or numbness, feels much better wants to go home today.    Objective   Blood pressure (!) 169/66, pulse 66, temperature 98.3 F (36.8 C), temperature source Oral, resp. rate (!) 21, height 5\' 6"  (1.676 m), weight 59 kg, SpO2 99 %.   Intake/Output Summary (Last 24 hours) at 08/28/2019 1007 Last data filed at 08/28/2019 6213 Gross per 24 hour  Intake 540 ml  Output 450 ml  Net 90 ml    Exam  Awake Alert, No new F.N deficits, Normal  affect [place].AT,PERRAL Supple Neck,No JVD, No cervical lymphadenopathy appriciated.  Symmetrical Chest wall movement, Good air movement bilaterally, CTAB RRR,No Gallops,Rubs or new Murmurs, No Parasternal Heave +ve B.Sounds, Abd Soft, Non tender, No organomegaly appriciated, No rebound -guarding or rigidity. No Cyanosis, Clubbing or edema, No new Rash or bruise   Data Review   CBC w Diff:  Lab Results  Component Value Date   WBC 4.7 08/28/2019   HGB 9.5 (L) 08/28/2019   HGB 9.6 (L) 01/01/2019   HCT 28.4 (L) 08/28/2019   HCT 30.0 (L) 01/01/2019   PLT 264 08/28/2019   PLT 243 01/01/2019   LYMPHOPCT 22 08/15/2019   MONOPCT 16 08/15/2019   EOSPCT 9 08/15/2019   BASOPCT 1 08/15/2019    CMP:  Lab Results  Component Value Date   NA 137 08/28/2019   NA 137 01/15/2019   K 4.6 08/28/2019   CL 103 08/28/2019   CO2 23 08/28/2019   BUN 44 (H) 08/28/2019   BUN 58 (H) 01/15/2019   CREATININE 1.34 (H) 08/28/2019   CREATININE 1.44 (H) 07/18/2019   GLU 91 06/30/2016   PROT 7.0 08/15/2019   PROT 8.1 08/07/2017   ALBUMIN 3.4 (L) 08/15/2019   ALBUMIN 4.1 08/07/2017   BILITOT 0.1 (L) 08/15/2019   BILITOT 0.2 08/07/2017   ALKPHOS 70 08/15/2019   AST 25 08/15/2019   ALT 19 08/15/2019  .   Total Time in preparing paper work, data evaluation and todays exam - 35 minutes  Susa Raring M.D on 08/28/2019 at 10:07 AM  [facility]   Office  [phone]

## 2019-08-28 NOTE — Patient Outreach (Signed)
[facility]) Care Management  08/28/2019  ELFREDA BLANCHET 11-Oct-1923 [id number]   Noted that member presented to ED yesterday with chest pain and was admitted.  Hospital liaisons notified, this care manager will plan to follow up with daughter once member is discharged.  [name], [place], MSN [place] [phone]

## 2019-08-28 NOTE — Discharge Instructions (Signed)
Follow with Primary MD [name], NP in 7 days   Get CBC, CMP, 2 view Chest X ray -  checked next visit within 1 week by Primary MD    Activity: As tolerated with Full fall precautions use walker/cane & assistance as needed  Disposition Home   Diet: Full liquid diet by mouth, feeding supplements via PEG tube as before.   Special Instructions: If you have smoked or chewed Tobacco  in the last 2 yrs please stop smoking, stop any regular Alcohol  and or any Recreational drug use.  On your next visit with your primary care physician please Get Medicines reviewed and adjusted.  Please request your Prim.MD to go over all Hospital Tests and Procedure/Radiological results at the follow up, please get all Hospital records sent to your Prim MD by signing hospital release before you go home.  If you experience worsening of your admission symptoms, develop shortness of breath, life threatening emergency, suicidal or homicidal thoughts you must seek medical attention immediately by calling 911 or calling your MD immediately  if symptoms less severe.  You Must read complete instructions/literature along with all the possible adverse reactions/side effects for all the Medicines you take and that have been prescribed to you. Take any new Medicines after you have completely understood and accpet all the possible adverse reactions/side effects.         Person Under Monitoring Name: Shannon [name]  Location: [place] [address]   Infection Prevention Recommendations for Individuals Confirmed to have, or Being Evaluated for, 2019 Novel Coronavirus (COVID-19) Infection Who Receive Care at Home  Individuals who are confirmed to have, or are being evaluated for, COVID-19 should follow the prevention steps below until a healthcare provider or local or state health department says they can return to normal activities.  Stay home except to get medical care You should restrict  activities outside your home, except for getting medical care. Do not go to work, school, or public areas, and do not use public transportation or taxis.  Call ahead before visiting your doctor Before your medical appointment, call the healthcare provider and tell them that you have, or are being evaluated for, COVID-19 infection. This will help the healthcare providers office take steps to keep other people from getting infected. Ask your healthcare provider to call the local or state health department.  Monitor your symptoms Seek prompt medical attention if your illness is worsening (e.g., difficulty breathing). Before going to your medical appointment, call the healthcare provider and tell them that you have, or are being evaluated for, COVID-19 infection. Ask your healthcare provider to call the local or state health department.  Wear a facemask You should wear a facemask that covers your nose and mouth when you are in the same room with other people and when you visit a healthcare provider. People who live with or visit you should also wear a facemask while they are in the same room with you.  Separate yourself from other people in your home As much as possible, you should stay in a different room from other people in your home. Also, you should use a separate bathroom, if available.  Avoid sharing household items You should not share dishes, drinking glasses, cups, eating utensils, towels, bedding, or other items with other people in your home. After using these items, you should wash them thoroughly with soap and water.  Cover your coughs and sneezes Cover your mouth and nose with  a tissue when you cough or sneeze, or you can cough or sneeze into your sleeve. Throw used tissues in a lined trash can, and immediately wash your hands with soap and water for at least 20 seconds or use an alcohol-based hand rub.  Wash your [facility] your hands often and thoroughly with soap and  water for at least 20 seconds. You can use an alcohol-based hand sanitizer if soap and water are not available and if your hands are not visibly dirty. Avoid touching your eyes, nose, and mouth with unwashed hands.   Prevention Steps for Caregivers and Household Members of Individuals Confirmed to have, or Being Evaluated for, COVID-19 Infection Being Cared for in the Home  If you live with, or provide care at home for, a person confirmed to have, or being evaluated for, COVID-19 infection please follow these guidelines to prevent infection:  Follow healthcare providers instructions Make sure that you understand and can help the patient follow any healthcare provider instructions for all care.  Provide for the patients basic needs You should help the patient with basic needs in the home and provide support for getting groceries, prescriptions, and other personal needs.  Monitor the patients symptoms If they are getting sicker, call his or her medical provider and tell them that the patient has, or is being evaluated for, COVID-19 infection. This will help the healthcare providers office take steps to keep other people from getting infected. Ask the healthcare provider to call the local or state health department.  Limit the number of people who have contact with the patient  If possible, have only one caregiver for the patient.  Other household members should stay in another home or place of residence. If this is not possible, they should stay  in another room, or be separated from the patient as much as possible. Use a separate bathroom, if available.  Restrict visitors who do not have an essential need to be in the home.  Keep older adults, very young children, and other sick people away from the patient Keep older adults, very young children, and those who have compromised immune systems or chronic health conditions away from the patient. This includes people with chronic  heart, lung, or kidney conditions, diabetes, and cancer.  Ensure good ventilation Make sure that shared spaces in the home have good air flow, such as from an air conditioner or an opened window, weather permitting.  Wash your hands often  Wash your hands often and thoroughly with soap and water for at least 20 seconds. You can use an alcohol based hand sanitizer if soap and water are not available and if your hands are not visibly dirty.  Avoid touching your eyes, nose, and mouth with unwashed hands.  Use disposable paper towels to dry your hands. If not available, use dedicated cloth towels and replace them when they become wet.  Wear a facemask and gloves  Wear a disposable facemask at all times in the room and gloves when you touch or have contact with the patients blood, body fluids, and/or secretions or excretions, such as sweat, saliva, sputum, nasal mucus, vomit, urine, or feces.  Ensure the mask fits over your nose and mouth tightly, and do not touch it during use.  Throw out disposable facemasks and gloves after using them. Do not reuse.  Wash your hands immediately after removing your facemask and gloves.  If your personal clothing becomes contaminated, carefully remove clothing and launder. Wash your hands after handling  contaminated clothing.  Place all used disposable facemasks, gloves, and other waste in a lined container before disposing them with other household waste.  Remove gloves and wash your hands immediately after handling these items.  Do not share dishes, glasses, or other household items with the patient  Avoid sharing household items. You should not share dishes, drinking glasses, cups, eating utensils, towels, bedding, or other items with a patient who is confirmed to have, or being evaluated for, COVID-19 infection.  After the person uses these items, you should wash them thoroughly with soap and water.  Wash laundry thoroughly  Immediately remove  and wash clothes or bedding that have blood, body fluids, and/or secretions or excretions, such as sweat, saliva, sputum, nasal mucus, vomit, urine, or feces, on them.  Wear gloves when handling laundry from the patient.  Read and follow directions on labels of laundry or clothing items and detergent. In general, wash and dry with the warmest temperatures recommended on the label.  Clean all areas the individual has used often  Clean all touchable surfaces, such as counters, tabletops, doorknobs, bathroom fixtures, toilets, phones, keyboards, tablets, and bedside tables, every day. Also, clean any surfaces that may have blood, body fluids, and/or secretions or excretions on them.  Wear gloves when cleaning surfaces the patient has come in contact with.  Use a diluted bleach solution (e.g., dilute bleach with 1 part bleach and 10 parts water) or a household disinfectant with a label that says EPA-registered for coronaviruses. To make a bleach solution at home, add 1 tablespoon of bleach to 1 quart (4 cups) of water. For a larger supply, add  cup of bleach to 1 gallon (16 cups) of water.  Read labels of cleaning products and follow recommendations provided on product labels. Labels contain instructions for safe and effective use of the cleaning product including precautions you should take when applying the product, such as wearing gloves or eye protection and making sure you have good ventilation during use of the product.  Remove gloves and wash hands immediately after cleaning.  Monitor yourself for signs and symptoms of illness Caregivers and household members are considered close contacts, should monitor their health, and will be asked to limit movement outside of the home to the extent possible. Follow the monitoring steps for close contacts listed on the symptom monitoring form.   ? If you have additional questions, contact your local health department or call the epidemiologist on call  at [phone] (available 24/7). ? This guidance is subject to change. For the most up-to-date guidance from [facility], please refer to their website: YouBlogs.pl     ===============================================================================  Information on my medicine - Coumadin   (Warfarin)  Why was Coumadin prescribed for you? Coumadin was prescribed for you because you have a blood clot or a medical condition that can cause an increased risk of forming blood clots. Blood clots can cause serious health problems by blocking the flow of blood to the heart, lung, or brain. Coumadin can prevent harmful blood clots from forming. As a reminder your indication for Coumadin is:   Stroke Prevention Because Of Atrial Fibrillation  What test will check on my response to Coumadin? While on Coumadin (warfarin) you will need to have an INR test regularly to ensure that your dose is keeping you in the desired range. The INR (international normalized ratio) number is calculated from the result of the laboratory test called prothrombin time (PT).  If an INR APPOINTMENT HAS NOT ALREADY BEEN  MADE FOR YOU please schedule an appointment to have this lab work done by your health care provider within 7 days. Your INR goal is usually a number between:  2 to 3 or your provider may give you a more narrow range like 2-2.5.  Ask your health care provider during an office visit what your goal INR is.  What  do you need to  know  About  COUMADIN? Take Coumadin (warfarin) exactly as prescribed by your healthcare provider about the same time each day.  DO NOT stop taking without talking to the doctor who prescribed the medication.  Stopping without other blood clot prevention medication to take the place of Coumadin may increase your risk of developing a new clot or stroke.  Get refills before you run out.  What do you do if you miss a dose? If you miss a  dose, take it as soon as you remember on the same day then continue your regularly scheduled regimen the next day.  Do not take two doses of Coumadin at the same time.  Important Safety Information A possible side effect of Coumadin (Warfarin) is an increased risk of bleeding. You should call your healthcare provider right away if you experience any of the following: ? Bleeding from an injury or your nose that does not stop. ? Unusual colored urine (red or dark brown) or unusual colored stools (red or black). ? Unusual bruising for unknown reasons. ? A serious fall or if you hit your head (even if there is no bleeding).  Some foods or medicines interact with Coumadin (warfarin) and might alter your response to warfarin. To help avoid this: ? Eat a balanced diet, maintaining a consistent amount of Vitamin K. ? Notify your provider about major diet changes you plan to make. ? Avoid alcohol or limit your intake to 1 drink for women and 2 drinks for men per day. (1 drink is 5 oz. wine, 12 oz. beer, or 1.5 oz. liquor.)  Make sure that ANY health care provider who prescribes medication for you knows that you are taking Coumadin (warfarin).  Also make sure the healthcare provider who is monitoring your Coumadin knows when you have started a new medication including herbals and non-prescription products.  Coumadin (Warfarin)  Major Drug Interactions  Increased Warfarin Effect Decreased Warfarin Effect  Alcohol (large quantities) Antibiotics (esp. Septra/Bactrim, Flagyl, Cipro) Amiodarone (Cordarone) Aspirin (ASA) Cimetidine (Tagamet) Megestrol (Megace) NSAIDs (ibuprofen, naproxen, etc.) Piroxicam (Feldene) Propafenone (Rythmol SR) Propranolol (Inderal) Isoniazid (INH) Posaconazole (Noxafil) Barbiturates (Phenobarbital) Carbamazepine (Tegretol) Chlordiazepoxide (Librium) Cholestyramine (Questran) Griseofulvin Oral Contraceptives Rifampin Sucralfate (Carafate) Vitamin K   Coumadin  (Warfarin) Major Herbal Interactions  Increased Warfarin Effect Decreased Warfarin Effect  Garlic Ginseng Ginkgo biloba Coenzyme Q10 Green tea St. Johns wort    Coumadin (Warfarin) FOOD Interactions  Eat a consistent number of servings per week of foods HIGH in Vitamin K (1 serving =  cup)  Collards (cooked, or boiled & drained) Kale (cooked, or boiled & drained) Mustard greens (cooked, or boiled & drained) Parsley *serving size only =  cup Spinach (cooked, or boiled & drained) Swiss chard (cooked, or boiled & drained) Turnip greens (cooked, or boiled & drained)  Eat a consistent number of servings per week of foods MEDIUM-HIGH in Vitamin K (1 serving = 1 cup)  Asparagus (cooked, or boiled & drained) Broccoli (cooked, boiled & drained, or raw & chopped) Brussel sprouts (cooked, or boiled & drained) *serving size only =  cup Lettuce, raw (green  leaf, endive, romaine) Spinach, raw Turnip greens, raw & chopped   These websites have more information on Coumadin (warfarin):  FailFactory.se; VeganReport.com.au;

## 2019-09-03 ENCOUNTER — Other Ambulatory Visit: Payer: Self-pay | Admitting: Nurse Practitioner

## 2019-09-03 DIAGNOSIS — D638 Anemia in other chronic diseases classified elsewhere: Secondary | ICD-10-CM

## 2019-09-04 ENCOUNTER — Other Ambulatory Visit: Payer: Self-pay | Admitting: *Deleted

## 2019-09-04 NOTE — Patient Outreach (Signed)
[facility]) Care Management  09/04/2019  MCKALA PANTALEON 04/12/1923 [id number]   Noted that member discharged from hospital on 5/27 after being admitted for observation for chest pain.  Call placed to member's daughter, no answer.  HIPAA compliant voice message left, will await call back.  Will follow up within the next 3-4 business days.  [name], [place], MSN [place] [phone]

## 2019-09-07 ENCOUNTER — Other Ambulatory Visit: Payer: Self-pay | Admitting: Nurse Practitioner

## 2019-09-09 ENCOUNTER — Other Ambulatory Visit: Payer: Self-pay | Admitting: *Deleted

## 2019-09-09 NOTE — Patient Outreach (Signed)
[facility]) Care Management  09/09/2019  Shannon [name] 08-Mar-1924 [id number]   Outreach attempt #2, successful.  Call placed to daughter to follow up on recent hospital visit.  She report member was having trouble with her A-fib and experiencing chest pain, was admitted for observation.  During visit, she was also diagnosed with Covid, despite being vaccinated.  Daughter report all other family in the home tested negative and member never experienced any symptoms.  She has since been retested and result was negative.  She will have follow up with PCP on 6/9 and with cardiology on 6/10.  Daughter still concerned about member's weight loss, now down to 132 pounds.  Member still having same eating pattern, Nepro, pudding, soup, and milkshakes.  She will discuss referral to nutritionist during office visit. Denies any urgent concerns at this time, will follow up within the next month.  [name] CM Care Plan Problem One     Most Recent Value  Care Plan Problem One  Risk for admission related to infection as evidenced by recent hospitalization  Role Documenting the Problem One  Care Management [place] for Problem One  Active  [name] Long Term Goal   Member will not be readmitted to hospital within the next 31 days  [name] Long Term Goal Start Date  07/21/19  [facility] Long Term Goal Met Date  09/09/19  [facility] CM Short Term Goal #1   Member will keep and attend follow up with PCP and cardiologist within the next 2 weeks  [name] CM Short Term Goal #1 Start Date  09/09/19  Interventions for Short Term Goal #1  Upcoming appointments reviewed with daughter, assessed need for transportation  [facility] CM Short Term Goal #2   Daughter will discuss follow up with nutritionist within the next 2 weeks  [name] CM Short Term Goal #2 Start Date  09/09/19  Interventions for Short Term Goal #2  Discussed member's weight loss with daughter, encouraged to continue daily weight in effort to document weight change.  encouraged to increase use of protein supplements     [name], Therapist, sports, MSN [place] Manager [phone]

## 2019-09-10 ENCOUNTER — Other Ambulatory Visit: Payer: Self-pay

## 2019-09-10 ENCOUNTER — Ambulatory Visit (INDEPENDENT_AMBULATORY_CARE_PROVIDER_SITE_OTHER): Payer: Medicare Other | Admitting: *Deleted

## 2019-09-10 ENCOUNTER — Encounter: Payer: Self-pay | Admitting: Cardiology

## 2019-09-10 ENCOUNTER — Ambulatory Visit (INDEPENDENT_AMBULATORY_CARE_PROVIDER_SITE_OTHER): Payer: Medicare Other | Admitting: Cardiology

## 2019-09-10 VITALS — BP 142/58 | HR 71 | Ht 66.0 in | Wt 136.0 lb

## 2019-09-10 DIAGNOSIS — I4891 Unspecified atrial fibrillation: Secondary | ICD-10-CM | POA: Diagnosis not present

## 2019-09-10 DIAGNOSIS — I480 Paroxysmal atrial fibrillation: Secondary | ICD-10-CM

## 2019-09-10 DIAGNOSIS — I10 Essential (primary) hypertension: Secondary | ICD-10-CM

## 2019-09-10 DIAGNOSIS — R079 Chest pain, unspecified: Secondary | ICD-10-CM | POA: Diagnosis not present

## 2019-09-10 DIAGNOSIS — Z5181 Encounter for therapeutic drug level monitoring: Secondary | ICD-10-CM | POA: Diagnosis not present

## 2019-09-10 DIAGNOSIS — I4892 Unspecified atrial flutter: Secondary | ICD-10-CM

## 2019-09-10 DIAGNOSIS — I5032 Chronic diastolic (congestive) heart failure: Secondary | ICD-10-CM | POA: Diagnosis not present

## 2019-09-10 LAB — POCT INR: INR: 3 (ref 2.0–3.0)

## 2019-09-10 NOTE — Patient Instructions (Signed)
Description   Tomorrow take 2 tablets of Warfarin then continue on same dosage 2 tablets daily except 3 tablets on Sundays and Thursdays. Recheck in 4 weeks. Call Coumadin Clinic with any new medications [phone].

## 2019-09-10 NOTE — Patient Instructions (Signed)
Medication Instructions:   Your physician recommends that you continue on your current medications as directed. Please refer to the Current Medication list given to you today.  *If you need a refill on your cardiac medications before your next appointment, please call your pharmacy*   Follow-Up:  3 MONTHS IN THE OFFICE WITH DR. Meda Coffee

## 2019-09-10 NOTE — Progress Notes (Signed)
Cardiology Office Note    Date:  09/10/2019   ID:  VIE DECELLES, DOB [birth year], MRN [id number]  PCP:  Sharon Seller, NP  Cardiologist: Tobias Alexander, MD EPS: None  Reason for visit: 4 months follow up  History of Present Illness:  Shannon [name] is a 86 y.o. female with history of chronic diastolic CHF, paroxysmal atrial fibrillation on Coumadin, gastric outlet obstruction s/p gastrojejunostomy feeding tube nonobstructive CAD, history of CVA, COPD, CKD stage III, insulin-dependent type 2 diabetes, hypertension, hyperlipidemia, anemia of chronic disease, lower extremity PAD s/p bilateral interventions followed by VVS,hypothyroidism, and depression    Patient admitted with syncope 11/24/2018 possible secondary to bradycardia-coreg stopped. Troponins normal, CT negative, echo normal LVEF. Norvasc added for BP control. Not seen by Cardiology. Readmitted 12/26/18 with BRBPR in the setting of constipation felt to be a hemorrhoidal bleed. Hbg stable 10.1 coumadin continued.   Carotid dopplers were stable, 30 day monitor without pauses or arrhthymias, . Crt up 1.61 so lasix was reduced 20 mg daily and legs began to sell. LOV 02/19/19 was getting canned soup/broth. I increased lasix to 20mg /40 mg alternating days.   09/10/2019 -the patient was hospitalized on May 26 with chest pain that was determined to be musculoskeletal.  Has been doing well since then, her daughter states that she moves around all day with her walker around her house.  She denies any chest pain or shortness of breath with that.  No recent syncope or falls.  She continues to be fed with a feeding tube.  Her weight has been stable.  She has no lower extremity edema no orthopnea proximal nocturnal dyspnea.  No bleeding with Coumadin.  Past Medical History:  Diagnosis Date  . Acute respiratory failure (HCC)   . Anemia    previous blood transfusions  . Arthritis    "all over"  . Asthma   . Atrial flutter (HCC)   .  Bradycardia    requiring previous d/c of BB and reduction of amiodarone  . CAD (coronary artery disease)    nonobstructive per notes  . Chronic diastolic CHF (congestive heart failure) (HCC)   . CKD (chronic kidney disease), stage III   . Complication of blood transfusion    "got the wrong blood type at [facility] in ~ 2015; no adverse reaction that we are aware of"/daughter, [name] (01/27/2016)  . COPD (chronic obstructive pulmonary disease) (HCC)   . Depression    "light case"  . DVT (deep venous thrombosis) (HCC) 01/2016   a. LLE DVT 01/2016 - switched from Eliquis to Coumadin.  [name] Dyspnea    with some activity  . Gastric stenosis    a. s/p stomach tube  . GERD (gastroesophageal reflux disease)   . History of blood transfusion    "several" (01/27/2016)  . History of stomach ulcers   . Hyperlipidemia   . Hypertension   . Hypothyroidism   . On home oxygen therapy    "2L; 9PM til 9AM" (06/27/2017)  . PAD (peripheral artery disease) (HCC)    a. prior gangrene, toe amputations, intervention.  [name] PAF (paroxysmal atrial fibrillation) (HCC)   . Paraesophageal hernia   . Perforated gastric ulcer (HCC)   . Pneumonia    "a few times" (06/27/2017)  . Seasonal allergies   . SIADH (syndrome of inappropriate ADH production) (HCC)    [name] 01/10/2015  . Small bowel obstruction (HCC)    "I don't know how many" (01/11/2015)  . Stroke [facility])  several- no residual  . Type II diabetes mellitus (HCC)    "related to prednisone use  for > 20 yr; once predinose stopped; no more DM RX" (01/27/2016)  . UTI (urinary tract infection) 02/08/2016  . Ventral hernia with bowel obstruction     Past Surgical History:  Procedure Laterality Date  . ABDOMINAL AORTOGRAM N/A 09/21/2016   Procedure: Abdominal Aortogram;  Surgeon: Maeola Harman, MD;  Location: [facility] INVASIVE CV LAB;  Service: Cardiovascular;  Laterality: N/A;  . AMPUTATION Left 09/25/2016   Procedure: AMPUTATION DIGIT- LEFT 2ND AND 3RD  TOES;  Surgeon: Larina Earthly, MD;  Location: [facility] OR;  Service: Vascular;  Laterality: Left;  . AMPUTATION Right 05/03/2018   Procedure: AMPUTATION RIGHT GREAT TOE;  Surgeon: Larina Earthly, MD;  Location: [facility] OR;  Service: Vascular;  Laterality: Right;  . CATARACT EXTRACTION W/ INTRAOCULAR LENS  IMPLANT, BILATERAL    . CHOLECYSTECTOMY OPEN    . COLECTOMY    . ESOPHAGOGASTRODUODENOSCOPY N/A 01/19/2014   Procedure: ESOPHAGOGASTRODUODENOSCOPY (EGD);  Surgeon: Hilarie Fredrickson, MD;  Location: [facility] ENDOSCOPY;  Service: Endoscopy;  Laterality: N/A;  . ESOPHAGOGASTRODUODENOSCOPY N/A 01/20/2014   Procedure: ESOPHAGOGASTRODUODENOSCOPY (EGD);  Surgeon: Hilarie Fredrickson, MD;  Location: [facility] ENDOSCOPY;  Service: Endoscopy;  Laterality: N/A;  . ESOPHAGOGASTRODUODENOSCOPY N/A 03/19/2014   Procedure: ESOPHAGOGASTRODUODENOSCOPY (EGD);  Surgeon: Rachael Fee, MD;  Location: [facility]COPY;  Service: Endoscopy;  Laterality: N/A;  . ESOPHAGOGASTRODUODENOSCOPY N/A 07/08/2015   Procedure: ESOPHAGOGASTRODUODENOSCOPY (EGD);  Surgeon: Sherrilyn Rist, MD;  Location: [facility] ENDOSCOPY;  Service: Endoscopy;  Laterality: N/A;  . ESOPHAGOGASTRODUODENOSCOPY (EGD) WITH PROPOFOL N/A 09/15/2015   Procedure: ESOPHAGOGASTRODUODENOSCOPY (EGD) WITH PROPOFOL;  Surgeon: Ruffin Frederick, MD;  Location: [facility] ENDOSCOPY;  Service: Gastroenterology;  Laterality: N/A;  . GASTROJEJUNOSTOMY     hx/notes 01/10/2015  . GASTROJEJUNOSTOMY N/A 09/23/2015   Procedure: OPEN GASTROJEJUNOSTOMY TUBE PLACEMENT;  Surgeon: De Blanch Kinsinger, MD;  Location: [facility] ORS;  Service: General;  Laterality: N/A;  . GLAUCOMA SURGERY Bilateral   . HERNIA REPAIR  2015  . IR CM INJ ANY COLONIC TUBE W/FLUORO  01/05/2017  . IR CM INJ ANY COLONIC TUBE W/FLUORO  06/07/2017  . IR CM INJ ANY COLONIC TUBE W/FLUORO  11/05/2017  . IR CM INJ ANY COLONIC TUBE W/FLUORO  09/18/2018  . IR CM INJ ANY COLONIC TUBE W/FLUORO  03/06/2019  . IR CM INJ ANY COLONIC TUBE W/FLUORO  03/11/2019  . IR GENERIC  HISTORICAL  01/07/2016   IR GJ TUBE CHANGE 01/07/2016 Malachy Moan, MD [facility]  . IR GENERIC HISTORICAL  01/27/2016   IR MECH REMOV OBSTRUC MAT ANY COLON TUBE W/FLUORO 01/27/2016 Richarda Overlie, MD [facility]  . IR GENERIC HISTORICAL  02/07/2016   IR PATIENT EVAL TECH 0-60 MINS Darrell K Allred, PA-C [facility]  . IR GENERIC HISTORICAL  02/08/2016   IR GJ TUBE CHANGE 02/08/2016 Berdine Dance, MD [facility]  . IR GENERIC HISTORICAL  01/06/2016   IR GJ TUBE CHANGE 01/06/2016 [facility] OUT REF  . IR GENERIC HISTORICAL  05/02/2016   IR CM INJ ANY COLONIC TUBE W/FLUORO 05/02/2016 Oley Balm, MD [facility]  . IR GENERIC HISTORICAL  05/15/2016   IR GJ TUBE CHANGE 05/15/2016 Simonne Come, MD [facility]  . IR GENERIC HISTORICAL  06/28/2016   IR GJ TUBE CHANGE 06/28/2016 [facility]  . IR GJ TUBE CHANGE  02/20/2017  . IR GJ TUBE CHANGE  05/10/2017  . IR GJ TUBE CHANGE  07/06/2017  .  IR GJ TUBE CHANGE  08/02/2017  . IR GJ TUBE CHANGE  10/15/2017  . IR GJ TUBE CHANGE  12/26/2017  . IR GJ TUBE CHANGE  04/08/2018  . IR GJ TUBE CHANGE  06/19/2018  . IR GJ TUBE CHANGE  03/03/2019  . IR GJ TUBE CHANGE  06/06/2019  . IR PATIENT EVAL TECH 0-60 MINS  10/19/2016  . IR PATIENT EVAL TECH 0-60 MINS  12/25/2016  . IR PATIENT EVAL TECH 0-60 MINS  01/29/2017  . IR PATIENT EVAL TECH 0-60 MINS  04/04/2017  . IR PATIENT EVAL TECH 0-60 MINS  04/30/2017  . IR PATIENT EVAL TECH 0-60 MINS  07/31/2017  . IR REPLC DUODEN/JEJUNO TUBE PERCUT W/FLUORO  11/14/2016  . LAPAROTOMY N/A 01/20/2015   Procedure: EXPLORATORY LAPAROTOMY;  Surgeon: Abigail Miyamoto, MD;  Location: [facility] OR;  Service: General;  Laterality: N/A;  . LOWER EXTREMITY ANGIOGRAPHY Left 09/21/2016   Procedure: Lower Extremity Angiography;  Surgeon: Maeola Harman, MD;  Location: [facility] INVASIVE CV LAB;  Service: Cardiovascular;  Laterality: Left;  . LOWER EXTREMITY ANGIOGRAPHY Right 06/27/2017   Procedure: Lower Extremity Angiography;  Surgeon: Maeola Harman, MD;  Location: [facility] INVASIVE CV LAB;  Service: Cardiovascular;  Laterality: Right;  . LYSIS OF ADHESION N/A 01/20/2015   Procedure: LYSIS OF ADHESIONS < 1 HOUR;  Surgeon: Abigail Miyamoto, MD;  Location: [facility] OR;  Service: General;  Laterality: N/A;  . PERIPHERAL VASCULAR BALLOON ANGIOPLASTY Left 09/21/2016   Procedure: Peripheral Vascular Balloon Angioplasty;  Surgeon: Maeola Harman, MD;  Location: [facility] INVASIVE CV LAB;  Service: Cardiovascular;  Laterality: Left;  drug coated balloon  . PERIPHERAL VASCULAR BALLOON ANGIOPLASTY Right 06/27/2017   Procedure: PERIPHERAL VASCULAR BALLOON ANGIOPLASTY;  Surgeon: Maeola Harman, MD;  Location: [facility] INVASIVE CV LAB;  Service: Cardiovascular;  Laterality: Right;  SFA/TPTRUNK  . TONSILLECTOMY    . TUBAL LIGATION    . VENTRAL HERNIA REPAIR  2015   incarcerated ventral hernia ([facility] 09/2013)/notes 01/10/2015    Current Medications: Current Meds  Medication Sig  . acetaminophen (TYLENOL) 500 MG tablet Take 500 mg by mouth every 6 (six) hours as needed (pain).   . Amino Acids-Protein Hydrolys (FEEDING SUPPLEMENT, PRO-STAT SUGAR FREE 64,) LIQD Take 30 mLs by mouth 2 (two) times daily.  [name] amLODipine (NORVASC) 5 MG tablet Take 1 tablet (5 mg total) by mouth daily.  [name] atorvastatin (LIPITOR) 10 MG tablet TAKE 1 TABLET BY MOUTH EVERY DAY  . b complex vitamins tablet Take 1 tablet by mouth daily.   . calcium-vitamin D (OSCAL WITH D) 500-200 MG-UNIT per tablet Take 1 tablet by mouth daily with breakfast.   . Carboxymethylcellulose Sodium (ARTIFICIAL TEARS OP) Place 1 drop into both eyes 4 (four) times daily.  . cycloSPORINE (RESTASIS) 0.05 % ophthalmic emulsion USE 1 DROP INTO BOTH EYES TWICE DAILY  . DULERA 200-5 MCG/ACT AERO TAKE TWO PUFFS BY MOUTH TWICE DAILY  . feeding supplement (OSMOLITE 1 CAL) LIQD Take 711 mLs by mouth See admin instructions. For 12 hours 2100 - 0900  . ferrous sulfate 220 (44 Fe) MG/5ML solution TAKE 7.4 MLS (325  MG TOTAL) BY MOUTH DAILY.  . fluticasone (FLONASE) 50 MCG/ACT nasal spray SPRAY 2 SPRAYS INTO EACH NOSTRIL EVERY DAY  . furosemide (LASIX) 8 MG/ML solution 20 mg (2.5 ml) daily unless swelling occurs take 40 mg (5 ml) and then switch back to 20 mg daily  . gabapentin (NEURONTIN) 250 MG/5ML solution Take 6 mLs (300 mg total) by  mouth 2 (two) times daily.  [name] gentamicin cream (GARAMYCIN) 0.1 % Apply 1 application topically 3 (three) times daily as needed (dry skin).  . hydrALAZINE (APRESOLINE) 50 MG tablet TAKE 1 TABLET BY MOUTH THREE TIMES A DAY  . insulin aspart (NOVOLOG) 100 UNIT/ML FlexPen Inject 0-6 Units into the skin 3 (three) times daily with meals. New sliding Scale If BS is 151 - 251 = 2 units,  If BS is 251 -  350 = 4 units,  If BS is 351 - 450 = 6 units Call provider if BS runs more than 400  . Insulin Glargine (LANTUS SOLOSTAR) 100 UNIT/ML Solostar Pen Inject 8 Units into the skin at bedtime.  . Insulin Pen Needle (B-D UF III MINI PEN NEEDLES) 31G X 5 MM MISC Use twice daily with giving insulin injections. Dx E11.9  . ipratropium-albuterol (DUONEB) 0.5-2.5 (3) MG/3ML SOLN Take 3 mLs by nebulization every 6 (six) hours as needed (sob). Use 3 times daily x 4 days then every 6 hours as needed.  . isosorbide mononitrate (IMDUR) 30 MG 24 hr tablet TAKE 2 TABLETS BY MOUTH DAILY. PLEASE MAKE ANNUAL APPT WITH DR. Delton See FOR FUTURE REFILLS. THANK YOU. 1ST ATTEMPT.  . latanoprost (XALATAN) 0.005 % ophthalmic solution Place 1 drop into both eyes at bedtime.  . levalbuterol (XOPENEX HFA) 45 MCG/ACT inhaler Inhale 2 puffs into the lungs every 6 (six) hours as needed for wheezing or shortness of breath.  . levothyroxine (SYNTHROID) 125 MCG tablet TAKE 1 TABLET BY MOUTH EVERY DAY  . loratadine (CLARITIN) 10 MG tablet Take 10 mg by mouth daily as needed for allergies.  . Multiple Vitamin (MULTIVITAMIN WITH MINERALS) TABS tablet Take 1 tablet by mouth daily.  [name] nystatin (MYCOSTATIN/NYSTOP) powder Apply 1 g  topically as needed (skin irritation).   [name] oxyCODONE (ROXICODONE) 5 MG immediate release tablet Take 1 tablet (5 mg total) by mouth every 4 (four) hours as needed for severe pain. May take 1/2 tab q4hrs as needed for mild pain.  . pantoprazole (PROTONIX) 40 MG tablet Take 1 tablet (40 mg total) by mouth daily.  . Probiotic Product (EQL PROBIOTIC COLON SUPPORT PO) Take 1 capsule by mouth daily.  . sertraline (ZOLOFT) 20 MG/ML concentrated solution Take 2.5 mLs (50 mg total) by mouth daily.  . sucralfate (CARAFATE) 1 GM/10ML suspension Take 10 mLs (1 g total) by mouth 4 (four) times daily -  with meals and at bedtime.  [name] warfarin (COUMADIN) 2.5 MG tablet TAKE 2-3 TABLETS BY MOUTH DAILY AS DIRECTED BY COUMADIN CLINIC.  [name] Water For Irrigation, Sterile (FREE WATER) SOLN Place 180 mLs into feeding tube 3 (three) times daily.   Cliffton Asters Petrolatum-Mineral Oil (SYSTANE NIGHTTIME) OINT Place 1 application into both eyes at bedtime.      Allergies:   Penicillins   Social History   Socioeconomic History  . Marital status: Widowed    Spouse name: Not on file  . Number of children: 3  . Years of education: Not on file  . Highest education level: Not on file  Occupational History  . Not on file  Tobacco Use  . Smoking status: Never Smoker  . Smokeless tobacco: Former Neurosurgeon    Types: Snuff  . Tobacco comment: "used snuff in my younger days"  Substance and Sexual Activity  . Alcohol use: Never    Alcohol/week: 0.0 standard drinks  . Drug use: Never  . Sexual activity: Not Currently  Other Topics Concern  . Not on  file  Social History Narrative   Married in [place], lives in one story house with 2 people and no pets   Occupation: ?   Has a living Will, [place], and doesn't have DNR   Was living with Husband (in frail health) in [place] until 09/2013.  After her [facility] admission they both came to live with daughter in [place]   Social Determinants of Health   Financial Resource Strain:   .  Difficulty of Paying Living Expenses:   Food Insecurity: No Food Insecurity  . Worried About Programme researcher, broadcasting/film/video in the Last Year: Never true  . Ran Out of Food in the Last Year: Never true  Transportation Needs: No Transportation Needs  . Lack of Transportation (Medical): No  . Lack of Transportation (Non-Medical): No  Physical Activity:   . Days of Exercise per Week:   . Minutes of Exercise per Session:   Stress:   . Feeling of Stress :   Social Connections:   . Frequency of Communication with Friends and Family:   . Frequency of Social Gatherings with Friends and Family:   . Attends Religious Services:   . Active Member of Clubs or Organizations:   . Attends Banker Meetings:   [name] Marital Status:     Family History:  The patient's family history includes Diabetes in her brother; Glaucoma in her son and son; Hypertension in her mother; Stroke in her mother.   ROS:   Please see the history of present illness.    ROS All other systems reviewed and are negative.  PHYSICAL EXAM:   VS:  BP (!) 142/58   Pulse 71   Ht 5\' 6"  (1.676 m)   Wt 136 lb (61.7 kg)   SpO2 98%   BMI 21.95 kg/m   Physical Exam  GEN: Thin, elderly, in no acute distress  Neck: no JVD, carotid bruits, or masses Cardiac:RRR; 2/6 systolic murmur LSB Respiratory:  clear to auscultation bilaterally, normal work of breathing GI: soft, nontender, nondistended, + BS Ext: right foot in boot, minimal swelling  Neuro:  Alert and Oriented x 3 Psych: euthymic mood, full affect  Wt Readings from Last 3 Encounters:  09/10/19 136 lb (61.7 kg)  08/28/19 130 lb (59 kg)  07/18/19 139 lb (63 kg)     Studies/Labs Reviewed:   EKG:  EKG is ordered today.   Shows normal sinus rhythm 66 bpm, normal EKG unchanged from prior.  Recent Labs: 07/07/2019: B Natriuretic Peptide 242.4 08/15/2019: ALT 19 08/27/2019: Magnesium 2.3 08/28/2019: BUN 44; Creatinine, Ser 1.34; Hemoglobin 9.5; Platelets 264; Potassium 4.6;  Sodium 137; TSH 1.428   Lipid Panel    Component Value Date/Time   CHOL 131 10/15/2018 1122   CHOL 126 04/07/2015 1015   TRIG 188 (H) 10/15/2018 1122   HDL 47 (L) 10/15/2018 1122   HDL 69 04/07/2015 1015   CHOLHDL 2.8 10/15/2018 1122   VLDL 26 12/24/2013 0456   LDLCALC 58 10/15/2018 1122   Additional studies/ records that were reviewed today include:  2D echo 05/19/2019   1. Left ventricular ejection fraction, by estimation, is 65 to 70%. The  left ventricle has normal function. The left ventricle has no regional  wall motion abnormalities. There is mild concentric left ventricular  hypertrophy. Left ventricular diastolic  parameters are consistent with Grade I diastolic dysfunction (impaired  relaxation).   2. Right ventricular systolic function is normal. The right ventricular  size is normal. There is normal pulmonary  artery systolic pressure. The  estimated right ventricular systolic pressure is 31.4 mmHg.   3. The mitral valve is degenerative. Trivial mitral valve regurgitation.   4. The aortic valve has an indeterminant number of cusps. Aortic valve  regurgitation is trivial. Mild aortic valve sclerosis is present, with no  evidence of aortic valve stenosis.   5. The inferior vena cava is normal in size with <50% respiratory  variability, suggesting right atrial pressure of 8 mmHg.   Comparison(s): A prior study was performed on 11/25/2018. No significant  change from prior study.   Carotid Dopplers 2/2021Summary:  Right Carotid: Velocities in the right ICA are consistent with a 1-39%  stenosis.   Left Carotid: Velocities in the left ICA are consistent with a 1-39%  stenosis.   Vertebrals: Bilateral vertebral arteries demonstrate antegrade flow.    ASSESSMENT:    1. Chronic diastolic CHF (congestive heart failure) (HCC)   2. PAF (paroxysmal atrial fibrillation) (HCC)   3. Essential hypertension   4. Chest pain of uncertain etiology     PLAN:  In order of  problems listed above:  Recent hospitalization with chest pain, determined to be musculoskeletal, now asymptomatic, will continue same management.  PAF with syncope 11/2018 felt secondary to bradycardia and Coreg stopped.  30-day monitor without significant arrhythmia. NSR on EKG in hospital, in sinus rhythm today.  Essential hypertension BP controlled.  No orthostatic hypotension.  Chronic diastolic CHF normal LVEF with grade 1 diastolic dysfunction on echo 05/2019, euvolemic today.   GI bleed felt secondary to hemorrhoids 12/2018 continues on Coumadin, most recent INR 2.4 hemoglobin 9.5.   Medication Adjustments/Labs and Tests Ordered: Current medicines are reviewed at length with the patient today.  Concerns regarding medicines are outlined above.  Medication changes, Labs and Tests ordered today are listed in the Patient Instructions below. Patient Instructions  Medication Instructions:   Your physician recommends that you continue on your current medications as directed. Please refer to the Current Medication list given to you today.  *If you need a refill on your cardiac medications before your next appointment, please call your pharmacy*   Follow-Up:  3 MONTHS IN THE OFFICE WITH DR. Delton See       Signed, Tobias Alexander, MD  09/10/2019 11:41 AM    [facility] [address] [place], [place], [place]  [address] Phone: [phone]; Fax: [phone]

## 2019-09-11 ENCOUNTER — Ambulatory Visit (INDEPENDENT_AMBULATORY_CARE_PROVIDER_SITE_OTHER): Payer: Medicare Other | Admitting: Family

## 2019-09-11 ENCOUNTER — Encounter: Payer: Self-pay | Admitting: Family

## 2019-09-11 VITALS — BP 160/72 | HR 76 | Temp 97.5°F | Resp 16 | Ht 66.0 in | Wt 135.0 lb

## 2019-09-11 DIAGNOSIS — D638 Anemia in other chronic diseases classified elsewhere: Secondary | ICD-10-CM | POA: Diagnosis not present

## 2019-09-11 DIAGNOSIS — E1122 Type 2 diabetes mellitus with diabetic chronic kidney disease: Secondary | ICD-10-CM

## 2019-09-11 DIAGNOSIS — N1832 Chronic kidney disease, stage 3b: Secondary | ICD-10-CM

## 2019-09-11 DIAGNOSIS — B372 Candidiasis of skin and nail: Secondary | ICD-10-CM | POA: Diagnosis not present

## 2019-09-11 DIAGNOSIS — I5032 Chronic diastolic (congestive) heart failure: Secondary | ICD-10-CM | POA: Diagnosis not present

## 2019-09-11 DIAGNOSIS — E1121 Type 2 diabetes mellitus with diabetic nephropathy: Secondary | ICD-10-CM | POA: Diagnosis not present

## 2019-09-11 DIAGNOSIS — I110 Hypertensive heart disease with heart failure: Secondary | ICD-10-CM

## 2019-09-11 DIAGNOSIS — Z794 Long term (current) use of insulin: Secondary | ICD-10-CM

## 2019-09-11 DIAGNOSIS — I4819 Other persistent atrial fibrillation: Secondary | ICD-10-CM

## 2019-09-11 LAB — CBC WITH DIFFERENTIAL/PLATELET
Absolute Monocytes: 597 cells/uL (ref 200–950)
Basophils Absolute: 70 cells/uL (ref 0–200)
Basophils Relative: 1.2 %
Eosinophils Absolute: 110 cells/uL (ref 15–500)
Eosinophils Relative: 1.9 %
HCT: 29.8 % — ABNORMAL LOW (ref 35.0–45.0)
Hemoglobin: 9.6 g/dL — ABNORMAL LOW (ref 11.7–15.5)
Lymphs Abs: 847 cells/uL — ABNORMAL LOW (ref 850–3900)
MCH: 30.1 pg (ref 27.0–33.0)
MCHC: 32.2 g/dL (ref 32.0–36.0)
MCV: 93.4 fL (ref 80.0–100.0)
MPV: 11.6 fL (ref 7.5–12.5)
Monocytes Relative: 10.3 %
Neutro Abs: 4176 cells/uL (ref 1500–7800)
Neutrophils Relative %: 72 %
Platelets: 306 10*3/uL (ref 140–400)
RBC: 3.19 10*6/uL — ABNORMAL LOW (ref 3.80–5.10)
RDW: 14 % (ref 11.0–15.0)
Total Lymphocyte: 14.6 %
WBC: 5.8 10*3/uL (ref 3.8–10.8)

## 2019-09-11 LAB — BASIC METABOLIC PANEL WITH GFR
BUN/Creatinine Ratio: 40 (calc) — ABNORMAL HIGH (ref 6–22)
BUN: 61 mg/dL — ABNORMAL HIGH (ref 7–25)
CO2: 26 mmol/L (ref 20–32)
Calcium: 9.7 mg/dL (ref 8.6–10.4)
Chloride: 102 mmol/L (ref 98–110)
Creat: 1.54 mg/dL — ABNORMAL HIGH (ref 0.60–0.88)
GFR, Est African American: 33 mL/min/{1.73_m2} — ABNORMAL LOW (ref >=60)
GFR, Est Non African American: 29 mL/min/{1.73_m2} — ABNORMAL LOW (ref >=60)
Glucose, Bld: 97 mg/dL (ref 65–99)
Potassium: 4.5 mmol/L (ref 3.5–5.3)
Sodium: 137 mmol/L (ref 135–146)

## 2019-09-11 MED ORDER — NYSTATIN 100000 UNIT/GM EX POWD: 1.0000 "application " | CUTANEOUS | 0 refills | Status: DC | PRN

## 2019-09-11 NOTE — Patient Instructions (Signed)
-   continue current medication  - labs done today will call you with results.

## 2019-09-11 NOTE — Progress Notes (Signed)
Provider: Young Brim FNP-C  [name], NP  Patient Care Team: [name], NP as PCP - General (Geriatric Medicine) Dorothy Spark, MD as PCP - Cardiology (Cardiology) Edrick Kins, DPM as Consulting Physician (Podiatry) Dorothy Spark, MD as Consulting Physician (Cardiology) [name] [name], NP as Nurse Practitioner (Geriatric Medicine) Camillo Flaming, [place] as Referring Physician (Optometry) [name], RN as [facility]  Extended Emergency Contact Information Primary Emergency Contact: Armstrong,[name] Address: [address]          [place], [place] [address] [place] Litter of [place] Phone: [phone] Mobile Phone: [phone] Relation: Daughter Secondary Emergency Contact: Callins,John  [place] Phone: [phone] Relation: Son  Code Status:  Full Code  Goals of care: Advanced Directive information Advanced Directives 09/11/2019  Does Patient Have a Medical Advance Directive? Yes  Type of Advance Directive [place]  Does patient want to make changes to medical advance directive? No - Patient declined  Copy of [place] in Chart? Yes - validated most recent copy scanned in chart (See row information)  Would patient like information on creating a medical advance directive? -     Chief Complaint  Patient presents with  . Hospitalization Follow-up    Follow Up.    HPI:  Pt is a 86 y.o. female seen today for an acute visit for follow up ED visit for peri cordial pain 08/27/2019 - 08/28/2019.She had non-ACS with down trending high sensitivity troponin level.EKG was nonacute.due to her advance age was considered not a candidate for invasive testing or intervention.chest pain resolved and was discharge home.she was advised to continue on Imdur,statin and coumadin.Her INR was therapeutic.she had icedental COVID-19 infection.Her granddaughter had COVID-19  few weeks ago.patient's chest X-ray was normal with no hypoxia.Has completed her COVID-19 vaccine.No specific COVD-19  treatment was given.she denies any fever,chills,nausea,vomiting,loss of taste or smell.  Afib -had a variable HR with episodes of bradycardia.Her home medication was continued. Diastolic CHF - latest EF 30% no signs of fluid overload.her home medication was continued.she denies any fatigue,shortness of breath,edema,cough or wheezing.  Past Medical History:  Diagnosis Date  . Acute respiratory failure ([place])   . Anemia    previous blood transfusions  . Arthritis    "all over"  . Asthma   . Atrial flutter ([place])   . Bradycardia    requiring previous d/c of BB and reduction of amiodarone  . CAD (coronary artery disease)    nonobstructive per notes  . Chronic diastolic CHF (congestive heart failure) ([place])   . CKD (chronic kidney disease), stage III   . Complication of blood transfusion    "got the wrong blood type at [facility] in ~ 2015; no adverse reaction that we are aware of"/daughter, [name] (01/27/2016)  . COPD (chronic obstructive pulmonary disease) ([place])   . Depression    "light case"  . DVT (deep venous thrombosis) ([place]) 01/2016   a. LLE DVT 01/2016 - switched from Eliquis to Coumadin.  [name] Dyspnea    with some activity  . Gastric stenosis    a. s/p stomach tube  . GERD (gastroesophageal reflux disease)   . History of blood transfusion    "several" (01/27/2016)  . History of stomach ulcers   . Hyperlipidemia   . Hypertension   . Hypothyroidism   . On home oxygen therapy    "2L; 9PM til 9AM" (06/27/2017)  . PAD (peripheral artery disease) ([place])  a. prior gangrene, toe amputations, intervention.  [name] PAF (paroxysmal atrial fibrillation) ([place])   . Paraesophageal hernia   . Perforated gastric ulcer ([place])   . Pneumonia    "a few times" (06/27/2017)  . Seasonal allergies   . SIADH (syndrome of inappropriate ADH production) ([place])    [name] 01/10/2015  . Small  bowel obstruction ([place])    "I don't know how many" (01/11/2015)  . Stroke [facility])    several- no residual  . Type II diabetes mellitus ([place])    "related to prednisone use  for > 20 yr; once predinose stopped; no more DM RX" (01/27/2016)  . UTI (urinary tract infection) 02/08/2016  . Ventral hernia with bowel obstruction    Past Surgical History:  Procedure Laterality Date  . ABDOMINAL AORTOGRAM N/A 09/21/2016   Procedure: Abdominal Aortogram;  Surgeon: Waynetta Sandy, MD;  Location: [facility];  Service: Cardiovascular;  Laterality: N/A;  . AMPUTATION Left 09/25/2016   Procedure: AMPUTATION DIGIT- LEFT 2ND AND 3RD TOES;  Surgeon: Rosetta Posner, MD;  Location: [facility];  Service: Vascular;  Laterality: Left;  . AMPUTATION Right 05/03/2018   Procedure: AMPUTATION RIGHT GREAT TOE;  Surgeon: Rosetta Posner, MD;  Location: [facility];  Service: Vascular;  Laterality: Right;  . CATARACT EXTRACTION W/ INTRAOCULAR LENS  IMPLANT, BILATERAL    . CHOLECYSTECTOMY OPEN    . COLECTOMY    . ESOPHAGOGASTRODUODENOSCOPY N/A 01/19/2014   Procedure: ESOPHAGOGASTRODUODENOSCOPY (EGD);  Surgeon: Irene Shipper, MD;  Location: [facility];  Service: Endoscopy;  Laterality: N/A;  . ESOPHAGOGASTRODUODENOSCOPY N/A 01/20/2014   Procedure: ESOPHAGOGASTRODUODENOSCOPY (EGD);  Surgeon: Irene Shipper, MD;  Location: [facility];  Service: Endoscopy;  Laterality: N/A;  . ESOPHAGOGASTRODUODENOSCOPY N/A 03/19/2014   Procedure: ESOPHAGOGASTRODUODENOSCOPY (EGD);  Surgeon: Milus Banister, MD;  Location: [facility];  Service: Endoscopy;  Laterality: N/A;  . ESOPHAGOGASTRODUODENOSCOPY N/A 07/08/2015   Procedure: ESOPHAGOGASTRODUODENOSCOPY (EGD);  Surgeon: Doran Stabler, MD;  Location: [facility] ENDOSCOPY;  Service: Endoscopy;  Laterality: N/A;  . ESOPHAGOGASTRODUODENOSCOPY (EGD) WITH PROPOFOL N/A 09/15/2015   Procedure: ESOPHAGOGASTRODUODENOSCOPY (EGD) WITH PROPOFOL;  Surgeon: Manus Gunning, MD;  [facility];  Service: Gastroenterology;  Laterality: N/A;  . GASTROJEJUNOSTOMY     hx/notes 01/10/2015  . GASTROJEJUNOSTOMY N/A 09/23/2015   Procedure: OPEN GASTROJEJUNOSTOMY TUBE PLACEMENT;  Surgeon: Arta Bruce Kinsinger, MD;  [facility];  Service: General;  Laterality: N/A;  . GLAUCOMA SURGERY Bilateral   . HERNIA REPAIR  2015  . IR CM INJ ANY COLONIC TUBE W/FLUORO  01/05/2017  . IR CM INJ ANY COLONIC TUBE W/FLUORO  06/07/2017  . IR CM INJ ANY COLONIC TUBE W/FLUORO  11/05/2017  . IR CM INJ ANY COLONIC TUBE W/FLUORO  09/18/2018  . IR CM INJ ANY COLONIC TUBE W/FLUORO  03/06/2019  . IR CM INJ ANY COLONIC TUBE W/FLUORO  03/11/2019  . IR GENERIC HISTORICAL  01/07/2016   IR GJ TUBE CHANGE 01/07/2016 Jacqulynn Cadet, MD [facility]  . IR GENERIC HISTORICAL  01/27/2016   IR MECH REMOV OBSTRUC MAT ANY COLON TUBE W/FLUORO 01/27/2016 Markus Daft, MD [facility]  . IR GENERIC HISTORICAL  02/07/2016   IR PATIENT EVAL TECH 0-60 MINS Darrell K Allred, PA-C [facility]  . IR GENERIC HISTORICAL  02/08/2016   IR GJ TUBE CHANGE 02/08/2016 Greggory Keen, MD [facility]  . IR GENERIC HISTORICAL  01/06/2016   IR GJ TUBE CHANGE 01/06/2016 [facility] OUT REF  . IR GENERIC HISTORICAL  05/02/2016   IR CM INJ ANY COLONIC TUBE W/FLUORO 05/02/2016 Arne Cleveland, MD [facility]  . IR GENERIC HISTORICAL  05/15/2016   IR GJ TUBE CHANGE 05/15/2016 Sandi Mariscal, MD [facility]  . IR GENERIC HISTORICAL  06/28/2016   IR GJ TUBE CHANGE 06/28/2016 [facility]  . IR GJ TUBE CHANGE  02/20/2017  . IR GJ TUBE CHANGE  05/10/2017  . IR GJ TUBE CHANGE  07/06/2017  . IR GJ TUBE CHANGE  08/02/2017  . IR GJ TUBE CHANGE  10/15/2017  . IR GJ TUBE CHANGE  12/26/2017  . IR [place] TUBE CHANGE  04/08/2018  . IR [place] TUBE CHANGE  06/19/2018  . IR [place] TUBE CHANGE  03/03/2019  . IR [place] TUBE CHANGE  06/06/2019  . IR PATIENT EVAL TECH 0-60 MINS  10/19/2016  . IR PATIENT EVAL TECH 0-60 MINS  12/25/2016  . IR PATIENT EVAL TECH 0-60 MINS  01/29/2017  . IR PATIENT  EVAL TECH 0-60 MINS  04/04/2017  . IR PATIENT EVAL TECH 0-60 MINS  04/30/2017  . IR PATIENT EVAL TECH 0-60 MINS  07/31/2017  . IR REPLC DUODEN/JEJUNO TUBE PERCUT W/FLUORO  11/14/2016  . LAPAROTOMY N/A 01/20/2015   Procedure: EXPLORATORY LAPAROTOMY;  Surgeon: Coralie Keens, MD;  Location: [facility];  Service: General;  Laterality: N/A;  . LOWER EXTREMITY ANGIOGRAPHY Left 09/21/2016   Procedure: Lower Extremity Angiography;  Surgeon: Waynetta Sandy, MD;  Location: [facility];  Service: Cardiovascular;  Laterality: Left;  . LOWER EXTREMITY ANGIOGRAPHY Right 06/27/2017   Procedure: Lower Extremity Angiography;  Surgeon: Waynetta Sandy, MD;  Location: [facility];  Service: Cardiovascular;  Laterality: Right;  . LYSIS OF ADHESION N/A 01/20/2015   Procedure: LYSIS OF ADHESIONS < 1 HOUR;  Surgeon: Coralie Keens, MD;  Location: [facility];  Service: General;  Laterality: N/A;  . PERIPHERAL VASCULAR BALLOON ANGIOPLASTY Left 09/21/2016   Procedure: Peripheral Vascular Balloon Angioplasty;  Surgeon: Waynetta Sandy, MD;  Location: [facility];  Service: Cardiovascular;  Laterality: Left;  drug coated balloon  . PERIPHERAL VASCULAR BALLOON ANGIOPLASTY Right 06/27/2017   Procedure: PERIPHERAL VASCULAR BALLOON ANGIOPLASTY;  Surgeon: Waynetta Sandy, MD;  Location: [facility];  Service: Cardiovascular;  Laterality: Right;  SFA/TPTRUNK  . TONSILLECTOMY    . TUBAL LIGATION    . VENTRAL HERNIA REPAIR  2015   incarcerated ventral hernia ([facility] 09/2013)/notes 01/10/2015    Allergies  Allergen Reactions  . Penicillins Itching, Rash and Other (See Comments)    Tolerated amoxicillin, unasyn, zosyn & cephalosporins in the past. Did it involve swelling of the face/tongue/throat, SOB, or low BP? No Did it involve sudden or severe rash/hives, skin peeling, or any reaction on the inside of your mouth or nose? No Did you need to seek medical attention at a  hospital or doctor's office? Unknown When did it last happen?5+ years If all above answers are "NO", may proceed with cephalosporin use.     Outpatient Encounter Medications as of 09/11/2019  Medication Sig  . acetaminophen (TYLENOL) 500 MG tablet Take 500 mg by mouth every 6 (six) hours as needed (pain).   . Amino Acids-Protein Hydrolys (FEEDING SUPPLEMENT, PRO-STAT SUGAR FREE 64,) LIQD Take 30 mLs by mouth 2 (two) times daily.  [name] amLODipine (NORVASC) 5 MG tablet Take 1 tablet (5 mg total) by mouth daily.  [name] atorvastatin (LIPITOR) 10 MG tablet TAKE 1 TABLET BY MOUTH EVERY DAY  . b complex vitamins  tablet Take 1 tablet by mouth daily.   . calcium-vitamin D (OSCAL WITH D) 500-200 MG-UNIT per tablet Take 1 tablet by mouth daily with breakfast.   . Carboxymethylcellulose Sodium (ARTIFICIAL TEARS OP) Place 1 drop into both eyes 4 (four) times daily.  . cycloSPORINE (RESTASIS) 0.05 % ophthalmic emulsion USE 1 DROP INTO BOTH EYES TWICE DAILY  . DULERA 200-5 MCG/ACT AERO TAKE TWO PUFFS BY MOUTH TWICE DAILY  . feeding supplement (OSMOLITE 1 CAL) LIQD Take 711 mLs by mouth See admin instructions. For 12 hours 2100 - 0900  . ferrous sulfate 220 (44 Fe) MG/5ML solution TAKE 7.4 MLS (325 MG TOTAL) BY MOUTH DAILY.  . fluticasone (FLONASE) 50 MCG/ACT nasal spray SPRAY 2 SPRAYS INTO EACH NOSTRIL EVERY DAY  . furosemide (LASIX) 8 MG/ML solution 20 mg (2.5 ml) daily unless swelling occurs take 40 mg (5 ml) and then switch back to 20 mg daily  . gabapentin (NEURONTIN) 250 MG/5ML solution Take 6 mLs (300 mg total) by mouth 2 (two) times daily.  [name] gentamicin cream (GARAMYCIN) 0.1 % Apply 1 application topically 3 (three) times daily as needed (dry skin).  . hydrALAZINE (APRESOLINE) 50 MG tablet TAKE 1 TABLET BY MOUTH THREE TIMES A DAY  . insulin aspart (NOVOLOG) 100 UNIT/ML FlexPen Inject 0-6 Units into the skin 3 (three) times daily with meals. New sliding Scale If BS is 151 - 251 = 2 units,  If BS is 251  -  350 = 4 units,  If BS is 351 - 450 = 6 units Call provider if BS runs more than 400  . Insulin Glargine (LANTUS SOLOSTAR) 100 UNIT/ML Solostar Pen Inject 8 Units into the skin at bedtime.  . Insulin Pen Needle (B-D UF III MINI PEN NEEDLES) 31G X 5 MM MISC Use twice daily with giving insulin injections. Dx E11.9  . ipratropium-albuterol (DUONEB) 0.5-2.5 (3) MG/3ML SOLN Take 3 mLs by nebulization every 6 (six) hours as needed (sob). Use 3 times daily x 4 days then every 6 hours as needed.  . isosorbide mononitrate (IMDUR) 30 MG 24 hr tablet TAKE 2 TABLETS BY MOUTH DAILY. PLEASE MAKE ANNUAL APPT WITH DR. Meda Coffee FOR FUTURE REFILLS. THANK YOU. 1ST ATTEMPT.  . latanoprost (XALATAN) 0.005 % ophthalmic solution Place 1 drop into both eyes at bedtime.  . levalbuterol (XOPENEX HFA) 45 MCG/ACT inhaler Inhale 2 puffs into the lungs every 6 (six) hours as needed for wheezing or shortness of breath.  . levothyroxine (SYNTHROID) 125 MCG tablet TAKE 1 TABLET BY MOUTH EVERY DAY  . loratadine (CLARITIN) 10 MG tablet Take 10 mg by mouth daily as needed for allergies.  . Multiple Vitamin (MULTIVITAMIN WITH MINERALS) TABS tablet Take 1 tablet by mouth daily.  [name] nystatin (MYCOSTATIN/NYSTOP) powder Apply 1 g topically as needed (skin irritation).   [name] oxyCODONE (ROXICODONE) 5 MG immediate release tablet Take 1 tablet (5 mg total) by mouth every 4 (four) hours as needed for severe pain. May take 1/2 tab q4hrs as needed for mild pain.  . pantoprazole (PROTONIX) 40 MG tablet Take 1 tablet (40 mg total) by mouth daily.  . Probiotic Product (EQL PROBIOTIC COLON SUPPORT PO) Take 1 capsule by mouth daily.  . sertraline (ZOLOFT) 20 MG/ML concentrated solution Take 2.5 mLs (50 mg total) by mouth daily.  [name] warfarin (COUMADIN) 2.5 MG tablet TAKE 2-3 TABLETS BY MOUTH DAILY AS DIRECTED BY COUMADIN CLINIC.  [name] Water For Irrigation, Sterile (FREE WATER) SOLN Place 180 mLs into feeding tube 3 (  three) times daily.   Dema Severin  Petrolatum-Mineral Oil (SYSTANE NIGHTTIME) OINT Place 1 application into both eyes at bedtime.   . [DISCONTINUED] sucralfate (CARAFATE) 1 GM/10ML suspension Take 10 mLs (1 g total) by mouth 4 (four) times daily -  with meals and at bedtime.   No facility-administered encounter medications on file as of 09/11/2019.    Review of Systems  Constitutional: Negative for chills, fatigue and fever.  HENT: Positive for hearing loss. Negative for congestion, postnasal drip, rhinorrhea, sinus pressure, sinus pain, sneezing, sore throat and tinnitus.   Eyes: Positive for visual disturbance. Negative for pain, discharge, redness and itching.  Respiratory: Negative for cough, chest tightness, shortness of breath and wheezing.   Cardiovascular: Negative for chest pain, palpitations and leg swelling.  Gastrointestinal: Negative for abdominal distention, abdominal pain, diarrhea, nausea and vomiting.  Musculoskeletal: Positive for gait problem. Negative for arthralgias, joint swelling and myalgias.    Immunization History  Administered Date(s) Administered  . Fluad Quad(high Dose 65+) 12/02/2018  . Influenza, High Dose Seasonal PF 01/19/2017  . Influenza,inj,Quad PF,6+ Mos 12/24/2013, 01/27/2015, 12/29/2015, 12/04/2017  . Influenza-Unspecified 12/02/2012  . Pneumococcal Conjugate-13 02/03/2014  . Pneumococcal Polysaccharide-23 08/31/2016  . Tdap 07/18/2016  . Zoster 04/12/2011   Pertinent  Health Maintenance Due  Topic Date Due  . FOOT EXAM  08/22/2017  . HEMOGLOBIN A1C  08/30/2019  . INFLUENZA VACCINE  11/02/2019  . OPHTHALMOLOGY EXAM  12/16/2019  . DEXA SCAN  Completed  . PNA vac Low Risk Adult  Completed   Fall Risk  09/11/2019 07/21/2019 07/18/2019 05/06/2019 03/17/2019  Falls in the past year? 0 0 0 0 0  Number falls in past yr: 0 0 0 0 0  Injury with Fall? 0 0 0 0 0  Risk for fall due to : - - - - -  Follow up - - - - Falls evaluation completed    Vitals:   09/11/19 1337  BP: (!) 160/72    Pulse: 76  Resp: 16  Temp: (!) 97.5 F (36.4 C)  SpO2: 99%  Weight: 135 lb (61.2 kg)  Height: '5\' 6"'  (1.676 m)   Body mass index is 21.79 kg/m. Physical Exam Constitutional:      General: She is not in acute distress.    Appearance: She is normal weight. She is not ill-appearing.  HENT:     Head: Normocephalic.     Right Ear: Tympanic membrane, ear canal and external ear normal. There is no impacted cerumen.     Left Ear: Tympanic membrane, ear canal and external ear normal. There is no impacted cerumen.     Ears:     Comments: HOH     Nose: Nose normal. No congestion or rhinorrhea.     Mouth/Throat:     Mouth: Mucous membranes are moist.     Pharynx: Oropharynx is clear. No oropharyngeal exudate or posterior oropharyngeal erythema.     Comments: Has no teeth  Eyes:     General: No scleral icterus.       Right eye: No discharge.        Left eye: No discharge.     Extraocular Movements: Extraocular movements intact.     Conjunctiva/sclera: Conjunctivae normal.     Pupils: Pupils are equal, round, and reactive to light.     Comments: Corrective lens in place.  Neck:     Vascular: No carotid bruit.  Cardiovascular:     Rate and Rhythm: Normal rate and regular rhythm.  Pulses: Normal pulses.     Heart sounds: Normal heart sounds. No murmur heard.  No friction rub. No gallop.   Pulmonary:     Effort: Pulmonary effort is normal. No respiratory distress.     Breath sounds: Normal breath sounds. No wheezing, rhonchi or rales.  Chest:     Chest wall: No tenderness.  Abdominal:     General: Bowel sounds are normal. There is no distension.     Palpations: Abdomen is soft. There is no mass.     Tenderness: There is no abdominal tenderness. There is no right CVA tenderness, left CVA tenderness or rebound.     Comments: PEG tube in place.dressing dry clean and intact.  Musculoskeletal:        General: No swelling or tenderness.     Cervical back: Normal range of motion. No  rigidity or tenderness.     Right lower leg: No edema.     Left lower leg: No edema.     Comments: Unsteady gait walks with Rolator.  Lymphadenopathy:     Cervical: No cervical adenopathy.  Skin:    General: Skin is warm.     Coloration: Skin is not pale.     Findings: No bruising, erythema or rash.  Neurological:     Mental Status: She is alert and oriented to person, place, and time.     Cranial Nerves: No cranial nerve deficit.     Sensory: No sensory deficit.     Motor: No weakness.     Gait: Gait abnormal.  Psychiatric:        Mood and Affect: Mood normal.        Behavior: Behavior normal.        Thought Content: Thought content normal.        Judgment: Judgment normal.     Labs reviewed: Recent Labs    05/18/19 1925 05/18/19 2355 08/15/19 0519 08/27/19 1239 08/27/19 2016 08/28/19 0101  NA 134*   < > 133* 135  --  137  K 4.3   < > 5.3* 4.5  --  4.6  CL 101   < > 100 99  --  103  CO2 24   < > 26 21*  --  23  GLUCOSE 80   < > 115* 96  --  93  BUN 60*   < > 59* 57*  --  44*  CREATININE 1.75*   < > 1.51* 1.52*  --  1.34*  CALCIUM 9.4   < > 8.9 9.2  --  9.7  MG 2.2  --   --   --  2.3  --    < > = values in this interval not displayed.   Recent Labs    05/18/19 1925 05/18/19 1925 05/27/19 1213 07/07/19 0122 08/15/19 0519  AST 28   < > '24 30 25  ' ALT 21   < > '16 21 19  ' ALKPHOS 89  --   --  91 70  BILITOT 0.7   < > 0.4 0.5 0.1*  PROT 8.0   < > 7.6 8.1 7.0  ALBUMIN 3.9  --   --  4.0 3.4*   < > = values in this interval not displayed.   Recent Labs    07/07/19 0122 07/08/19 0539 07/18/19 1350 07/18/19 1350 08/15/19 0519 08/27/19 1239 08/28/19 0101  WBC 5.5   < > 5.4   < > 4.3 4.6 4.7  NEUTROABS 3.4  --  3,375  --  2.3  --   --   HGB 10.0*   < > 9.5*   < > 8.8* 8.8* 9.5*  HCT 29.9*   < > 29.4*   < > 26.1* 26.3* 28.4*  MCV 93.7   < > 96.4   < > 94.9 92.9 91.6  PLT 219   < > 235   < > 190 255 264   < > = values in this interval not displayed.   Lab  Results  Component Value Date   TSH 1.428 08/28/2019   Lab Results  Component Value Date   HGBA1C 6.2 (H) 03/02/2019   Lab Results  Component Value Date   CHOL 131 10/15/2018   HDL 47 (L) 10/15/2018   LDLCALC 58 10/15/2018   TRIG 188 (H) 10/15/2018   CHOLHDL 2.8 10/15/2018    Significant Diagnostic Results in last 30 days:  DG Chest 2 View  Result Date: 08/27/2019 CLINICAL DATA:  Shortness of breath and chest pain EXAM: CHEST - 2 VIEW COMPARISON:  05/18/2019 FINDINGS: Cardiac shadow is mildly enlarged. Aortic calcifications are again seen. Lungs are hyperinflated without focal infiltrate or sizable effusion. Degenerative changes of the left shoulder joint are seen. Degenerative change of the thoracic spine is noted as well. IMPRESSION: No acute abnormality noted. Electronically Signed   By: Inez Catalina M.D.   On: 08/27/2019 12:57    Assessment/Plan 1. Anemia of chronic disease Latest Hgb 9.5 previous 8.8.Asymptomatic. Will recheck Hgb as below.on tube feeding.  - CBC with Differential/Platelet  2. Candidosis of skin Request nystatin for PEG tube surrounding skin tissues erythema.Not visualized during visit.  - nystatin (MYCOSTATIN/NYSTOP) powder; Apply 1 application topically as needed (skin irritation).  Dispense: 15 g; Refill: 0  3. Persistent atrial fibrillation (HCC) HR stable.continue coumadin.INR managed by coumadin clinic. - CBC with Differential/Platelet  4. Hypertensive heart disease with chronic diastolic congestive heart failure ([place]) B/p high this visit though within normal at home.Advised to monitor then Notify provider if B/p > 140/90.continue current medication.  - CBC with Differential/Platelet - BMP with eGFR([facility])  5. Type 2 diabetes mellitus with stage 3b chronic kidney disease, with long-term current use of insulin (HCC) Lab Results  Component Value Date   HGBA1C 6.2 (H) 03/02/2019  CBG controlled. - continue Novolog and Lantus 8 units at  bedtime.  Family/ staff Communication: Reviewed plan of care with patient and daughter verbalized understanding.   Labs/tests ordered:  - CBC with Differential/Platelet - BMP with eGFR([facility])  Next Appointment: Has upcoming appointment 10/20/2019 with PCP.  Sandrea Hughs, NP

## 2019-09-13 ENCOUNTER — Other Ambulatory Visit: Payer: Self-pay | Admitting: Cardiology

## 2019-09-15 ENCOUNTER — Ambulatory Visit: Payer: Self-pay | Admitting: *Deleted

## 2019-09-19 ENCOUNTER — Other Ambulatory Visit: Payer: Self-pay

## 2019-09-19 ENCOUNTER — Ambulatory Visit (INDEPENDENT_AMBULATORY_CARE_PROVIDER_SITE_OTHER): Payer: Medicare Other | Admitting: Podiatry

## 2019-09-19 DIAGNOSIS — L97512 Non-pressure chronic ulcer of other part of right foot with fat layer exposed: Secondary | ICD-10-CM | POA: Diagnosis not present

## 2019-09-19 DIAGNOSIS — E0859 Diabetes mellitus due to underlying condition with other circulatory complications: Secondary | ICD-10-CM

## 2019-09-22 ENCOUNTER — Encounter: Payer: Self-pay | Admitting: Podiatry

## 2019-09-22 ENCOUNTER — Ambulatory Visit (INDEPENDENT_AMBULATORY_CARE_PROVIDER_SITE_OTHER): Payer: Medicare Other | Admitting: Podiatry

## 2019-09-22 ENCOUNTER — Other Ambulatory Visit: Payer: Self-pay

## 2019-09-22 DIAGNOSIS — I739 Peripheral vascular disease, unspecified: Secondary | ICD-10-CM

## 2019-09-22 DIAGNOSIS — L97512 Non-pressure chronic ulcer of other part of right foot with fat layer exposed: Secondary | ICD-10-CM

## 2019-09-22 DIAGNOSIS — E0859 Diabetes mellitus due to underlying condition with other circulatory complications: Secondary | ICD-10-CM | POA: Diagnosis not present

## 2019-09-22 NOTE — Progress Notes (Signed)
Subjective:  86 y.o. female with PMHx of diabetes mellitus presenting today for follow up evaluation of chronic ulcerations of the right foot.  Patient's daughter states that she possibly noticed some purulent drainage coming from the toe area.  They were concerned and she presents today for follow-up treatment and evaluation.  She has been applying Betadine and wearing postoperative shoe.  Past Medical History:  Diagnosis Date   Acute respiratory failure ([place])    Anemia    previous blood transfusions   Arthritis    "all over"   Asthma    Atrial flutter (H[facility])    Bradycardia    requiring previous d/c of BB and reduction of amiodarone   CAD (coronary artery disease)    nonobstructive per notes   Chronic diastolic CHF (congestive heart failure) ([place])    CKD (chronic kidney disease), stage III    Complication of blood transfusion    "got the wrong blood type at [facility] in ~ 2015; no adverse reaction that we are aware of"/daughter, [name] (01/27/2016)   COPD (chronic obstructive pulmonary disease) ([place])    Depression    "light case"   DVT (deep venous thrombosis) ([place]) 01/2016   a. LLE DVT 01/2016 - switched from Eliquis to Coumadin.   Dyspnea    with some activity   Gastric stenosis    a. s/p stomach tube   GERD (gastroesophageal reflux disease)    History of blood transfusion    "several" (01/27/2016)   History of stomach ulcers    Hyperlipidemia    Hypertension    Hypothyroidism    On home oxygen therapy    "2L; 9PM til 9AM" (06/27/2017)   PAD (peripheral artery disease) (H[facility])    a. prior gangrene, toe amputations, intervention.   PAF (paroxysmal atrial fibrillation) ([place])    Paraesophageal hernia    Perforated gastric ulcer ([place])    Pneumonia    "a few times" (06/27/2017)   Seasonal allergies    SIADH (syndrome of inappropriate ADH production) ([place])    [name] 01/10/2015   Small bowel obstruction ([place])    "I don't know how many"  (01/11/2015)   Stroke ([place])    several- no residual   Type II diabetes mellitus ([place])    "related to prednisone use  for > 20 yr; once predinose stopped; no more DM RX" (01/27/2016)   UTI (urinary tract infection) 02/08/2016   Ventral hernia with bowel obstruction       Objective/Physical Exam General: The patient is alert and oriented x3 in no acute distress.  Dermatology:  Wounds noted to 2nd and 3rd and 4th digits of the right foot, both measuring 0.5 x 0.5 x 0.1 cm (LxWxD).  The ulcer to the distal tip of the second toe actually has some exposed bone.  There does not appear to be any acute inflammation or infection noted.  No malodor noted.  To the noted ulceration(s), there is no eschar. There is a moderate amount of slough, fibrin, and necrotic tissue noted. Granulation tissue and wound base is red. There is a minimal amount of serosanguineous drainage noted.  The remaining ulcers to the third and fourth toes demonstrate no exposed bone muscle-tendon ligament or joint. There is no malodor. Periwound integrity is intact. Skin is warm, dry and supple bilateral lower extremities.  Vascular: Diminished pedal pulses bilaterally. No edema or erythema noted. Capillary refill within normal limits.  Neurological: Epicritic and protective threshold diminished bilaterally.   Musculoskeletal Exam: History of  partial first ray amputation right foot.  History of partial toe amputations right foot.  Assessment: 1. Ulcerations noted to digits 2 and 3 and 4 secondary to diabetes mellitus 2. diabetes mellitus w/ peripheral neuropathy   Plan of Care:  1. Patient was evaluated. 2. Medically necessary excisional debridement including muscle and deep fascial tissue was performed using a tissue nipper and a chisel blade. Excisional debridement of all the necrotic nonviable tissue down to healthy bleeding viable tissue was performed with post-debridement measurements same as pre-.  Debridement of  the second toe did have some bone involvement noted.  Portions of sclerotic bone were excised and removed through the distal tip ulceration.  Currently there does not appear to be any acute inflammatory response. 3. The wound was cleansed and dry sterile dressing applied. 4. Continue using Betadine daily.  5.  Return to clinic in 2 weeks   Edrick Kins, DPM [facility]  Dr. Edrick Kins, [address]                                        [place], [place] [address]                Office [phone]  Fax [phone]

## 2019-09-23 ENCOUNTER — Encounter: Payer: Self-pay | Admitting: Podiatry

## 2019-09-23 ENCOUNTER — Telehealth: Payer: Self-pay | Admitting: *Deleted

## 2019-09-23 ENCOUNTER — Other Ambulatory Visit: Payer: Self-pay | Admitting: Cardiology

## 2019-09-23 NOTE — Telephone Encounter (Signed)
[name] notified and agreed.  °

## 2019-09-23 NOTE — Progress Notes (Signed)
Subjective:  86 y.o. female with PMHx of diabetes mellitus presenting today for follow up evaluation of ulcerations of the right foot.  She states she is doing well.  She does have some pain.  She has been doing dressing changes as instructed by Dr. Amalia Hailey.  Site is painful to walk on.  She denies any other acute complaints.   Past Medical History:  Diagnosis Date  . Acute respiratory failure ([place])   . Anemia    previous blood transfusions  . Arthritis    "all over"  . Asthma   . Atrial flutter ([place])   . Bradycardia    requiring previous d/c of BB and reduction of amiodarone  . CAD (coronary artery disease)    nonobstructive per notes  . Chronic diastolic CHF (congestive heart failure) ([place])   . CKD (chronic kidney disease), stage III   . Complication of blood transfusion    "got the wrong blood type at [facility] in ~ 2015; no adverse reaction that we are aware of"/daughter, [name] (01/27/2016)  . COPD (chronic obstructive pulmonary disease) ([place])   . Depression    "light case"  . DVT (deep venous thrombosis) ([place]) 01/2016   a. LLE DVT 01/2016 - switched from Eliquis to Coumadin.  [name] Dyspnea    with some activity  . Gastric stenosis    a. s/p stomach tube  . GERD (gastroesophageal reflux disease)   . History of blood transfusion    "several" (01/27/2016)  . History of stomach ulcers   . Hyperlipidemia   . Hypertension   . Hypothyroidism   . On home oxygen therapy    "2L; 9PM til 9AM" (06/27/2017)  . PAD (peripheral artery disease) ([place])    a. prior gangrene, toe amputations, intervention.  [name] PAF (paroxysmal atrial fibrillation) ([place])   . Paraesophageal hernia   . Perforated gastric ulcer ([place])   . Pneumonia    "a few times" (06/27/2017)  . Seasonal allergies   . SIADH (syndrome of inappropriate ADH production) ([place])    [name] 01/10/2015  . Small bowel obstruction ([place])    "I don't know how many" (01/11/2015)  . Stroke [facility])    several- no residual  . Type II  diabetes mellitus ([place])    "related to prednisone use  for > 20 yr; once predinose stopped; no more DM RX" (01/27/2016)  . UTI (urinary tract infection) 02/08/2016  . Ventral hernia with bowel obstruction       Objective/Physical Exam General: The patient is alert and oriented x3 in no acute distress.  Dermatology:  Wounds noted to 2nd and 3rd digits of the right foot, both measuring 0.6 cm x 0.6 cm x 0.1 cm (LxWxD).   To the noted ulceration(s), there is no eschar. There is a moderate amount of slough, fibrin, and necrotic tissue noted. Granulation tissue and wound base is red. There is a minimal amount of serosanguineous drainage noted. There is no exposed bone muscle-tendon ligament or joint. There is no malodor. Periwound integrity is intact. Skin is warm, dry and supple bilateral lower extremities.  Vascular: Palpable pedal pulses bilaterally. No edema or erythema noted. Capillary refill within normal limits.  Neurological: Epicritic and protective threshold diminished bilaterally.   Musculoskeletal Exam: Range of motion within normal limits to all pedal and ankle joints bilateral. Muscle strength 5/5 in all groups bilateral.   Assessment: 1. Ulcerations noted to digits 2 and 3 secondary to diabetes mellitus 2. diabetes mellitus w/ peripheral neuropathy  Plan of Care:  1. Patient was evaluated. 2. Medically necessary excisional debridement including subcutaneous tissue was performed using a tissue nipper and a chisel blade. Excisional debridement of all the necrotic nonviable tissue down to healthy bleeding viable tissue was performed with post-debridement measurements same as pre-. 3. The wound was cleansed and dry sterile dressing applied. 4.  Continue applying Betadine wet-to-dry dressing 5.  See Dr. Amalia Hailey back again in 1 week 6.  Continue wearing surgical shoe

## 2019-09-23 NOTE — Telephone Encounter (Signed)
Patient caregiver, [name] called and stated that patient has not had a bowel movement since Friday. Stated that she is alittle bloated and her stomach hurts. Is passing gas.  Has tried warm water, stool softner and suppositories with no relief.  Please Advise.

## 2019-09-23 NOTE — Telephone Encounter (Signed)
If pt can tolerate miralax would take 17 gm with 8 oz of water twice daily until she has a bowel movement Unsure what enema she used but can also use fleets enema daily

## 2019-09-24 ENCOUNTER — Emergency Department (HOSPITAL_COMMUNITY): Payer: Medicare Other

## 2019-09-24 ENCOUNTER — Encounter (HOSPITAL_COMMUNITY): Payer: Self-pay

## 2019-09-24 ENCOUNTER — Emergency Department (HOSPITAL_COMMUNITY)
Admission: EM | Admit: 2019-09-24 | Discharge: 2019-09-25 | Disposition: A | Discharge status: Home / Self Care | Payer: Medicare Other | Attending: Emergency Medicine | Admitting: Emergency Medicine

## 2019-09-24 ENCOUNTER — Other Ambulatory Visit: Payer: Self-pay

## 2019-09-24 DIAGNOSIS — R109 Unspecified abdominal pain: Secondary | ICD-10-CM | POA: Diagnosis present

## 2019-09-24 DIAGNOSIS — N183 Chronic kidney disease, stage 3 unspecified: Secondary | ICD-10-CM | POA: Diagnosis not present

## 2019-09-24 DIAGNOSIS — Z7984 Long term (current) use of oral hypoglycemic drugs: Secondary | ICD-10-CM | POA: Diagnosis not present

## 2019-09-24 DIAGNOSIS — L97513 Non-pressure chronic ulcer of other part of right foot with necrosis of muscle: Secondary | ICD-10-CM | POA: Insufficient documentation

## 2019-09-24 DIAGNOSIS — I129 Hypertensive chronic kidney disease with stage 1 through stage 4 chronic kidney disease, or unspecified chronic kidney disease: Secondary | ICD-10-CM | POA: Insufficient documentation

## 2019-09-24 DIAGNOSIS — E11621 Type 2 diabetes mellitus with foot ulcer: Secondary | ICD-10-CM | POA: Insufficient documentation

## 2019-09-24 DIAGNOSIS — K6289 Other specified diseases of anus and rectum: Secondary | ICD-10-CM | POA: Insufficient documentation

## 2019-09-24 DIAGNOSIS — Z79899 Other long term (current) drug therapy: Secondary | ICD-10-CM | POA: Insufficient documentation

## 2019-09-24 DIAGNOSIS — E1122 Type 2 diabetes mellitus with diabetic chronic kidney disease: Secondary | ICD-10-CM | POA: Diagnosis not present

## 2019-09-24 DIAGNOSIS — R1084 Generalized abdominal pain: Secondary | ICD-10-CM | POA: Diagnosis not present

## 2019-09-24 DIAGNOSIS — J441 Chronic obstructive pulmonary disease with (acute) exacerbation: Secondary | ICD-10-CM | POA: Insufficient documentation

## 2019-09-24 DIAGNOSIS — K5900 Constipation, unspecified: Secondary | ICD-10-CM | POA: Diagnosis not present

## 2019-09-24 LAB — URINALYSIS, ROUTINE W REFLEX MICROSCOPIC
Bilirubin Urine: NEGATIVE
Glucose, UA: NEGATIVE mg/dL
Hgb urine dipstick: NEGATIVE
Ketones, ur: NEGATIVE mg/dL
Leukocytes,Ua: NEGATIVE
Nitrite: NEGATIVE
Protein, ur: NEGATIVE mg/dL
Specific Gravity, Urine: 1.017 (ref 1.005–1.030)
pH: 7 (ref 5.0–8.0)

## 2019-09-24 LAB — CBC WITH DIFFERENTIAL/PLATELET
Abs Immature Granulocytes: 0.02 10*3/uL (ref 0.00–0.07)
Basophils Absolute: 0.1 10*3/uL (ref 0.0–0.1)
Basophils Relative: 1 %
Eosinophils Absolute: 0.2 10*3/uL (ref 0.0–0.5)
Eosinophils Relative: 3 %
HCT: 26.8 % — ABNORMAL LOW (ref 36.0–46.0)
Hemoglobin: 9.1 g/dL — ABNORMAL LOW (ref 12.0–15.0)
Immature Granulocytes: 0 %
Lymphocytes Relative: 16 %
Lymphs Abs: 1.1 10*3/uL (ref 0.7–4.0)
MCH: 31.1 pg (ref 26.0–34.0)
MCHC: 34 g/dL (ref 30.0–36.0)
MCV: 91.5 fL (ref 80.0–100.0)
Monocytes Absolute: 1 10*3/uL (ref 0.1–1.0)
Monocytes Relative: 15 %
Neutro Abs: 4.3 10*3/uL (ref 1.7–7.7)
Neutrophils Relative %: 65 %
Platelets: 240 10*3/uL (ref 150–400)
RBC: 2.93 MIL/uL — ABNORMAL LOW (ref 3.87–5.11)
RDW: 15.5 % (ref 11.5–15.5)
WBC: 6.7 10*3/uL (ref 4.0–10.5)
nRBC: 0 % (ref 0.0–0.2)

## 2019-09-24 LAB — COMPREHENSIVE METABOLIC PANEL
ALT: 17 U/L (ref 0–44)
AST: 21 U/L (ref 15–41)
Albumin: 3.4 g/dL — ABNORMAL LOW (ref 3.5–5.0)
Alkaline Phosphatase: 75 U/L (ref 38–126)
Anion gap: 10 (ref 5–15)
BUN: 49 mg/dL — ABNORMAL HIGH (ref 8–23)
CO2: 23 mmol/L (ref 22–32)
Calcium: 9 mg/dL (ref 8.9–10.3)
Chloride: 103 mmol/L (ref 98–111)
Creatinine, Ser: 1.33 mg/dL — ABNORMAL HIGH (ref 0.44–1.00)
GFR calc Af Amer: 40 mL/min — ABNORMAL LOW (ref >60)
GFR calc non Af Amer: 34 mL/min — ABNORMAL LOW (ref >60)
Glucose, Bld: 98 mg/dL (ref 70–99)
Potassium: 4.6 mmol/L (ref 3.5–5.1)
Sodium: 136 mmol/L (ref 135–145)
Total Bilirubin: 0.7 mg/dL (ref 0.3–1.2)
Total Protein: 7.6 g/dL (ref 6.5–8.1)

## 2019-09-24 LAB — LIPASE, BLOOD: Lipase: 24 U/L (ref 11–51)

## 2019-09-24 LAB — POC OCCULT BLOOD, ED: Fecal Occult Bld: NEGATIVE

## 2019-09-24 MED ORDER — SODIUM CHLORIDE 0.9 % IV BOLUS
500.0000 mL | Freq: Once | INTRAVENOUS | Status: AC
  Administered 2019-09-24: 500 mL via INTRAVENOUS

## 2019-09-24 MED ORDER — IOHEXOL 300 MG/ML  SOLN
75.0000 mL | Freq: Once | INTRAMUSCULAR | Status: AC | PRN
  Administered 2019-09-24: 75 mL via INTRAVENOUS

## 2019-09-24 MED ORDER — SODIUM CHLORIDE (PF) 0.9 % IJ SOLN
INTRAMUSCULAR | Status: AC
  Filled 2019-09-24: qty 50

## 2019-09-24 MED ORDER — BENZOCAINE 20 % RE OINT: TOPICAL_OINTMENT | RECTAL | 0 refills | Status: DC | PRN

## 2019-09-24 MED ORDER — SENNOSIDES-DOCUSATE SODIUM 8.6-50 MG PO TABS
1.0000 | ORAL_TABLET | Freq: Every evening | ORAL | 0 refills | Status: DC | PRN
Start: 2019-09-24 — End: 2019-11-07

## 2019-09-24 NOTE — ED Notes (Signed)
Patient transported to CT 

## 2019-09-24 NOTE — ED Provider Notes (Signed)
[facility] DEPT Provider Note   CSN: [id number] Arrival date & time: 09/24/19  1903     History Chief Complaint  Patient presents with  . Constipation  . Abdominal Pain    around g-tube site    Shannon [name] is a 86 y.o. female with history of atrial flutter, CAD, CKD, COPD, DVT, GERD, hypertension, hyperlipidemia, peripheral arterial disease, SIADH, small bowel obstruction, CVA, type 2 diabetes mellitus presents for evaluation of acute onset, persistent constipation for 1 week with associated abdominal pain.  She reports hurting "all over".  Also notes pain around her G-tube site and rectum.  Per family has not had a bowel movement in 5 days. She tells me this is because she hasn't been eating. Has tried enemas and MiraLAX without relief.  Denies nausea, vomiting, fevers, chills.  Has felt a little short of breath but denies chest pain.  She attributes the shortness of breath to constipation.   Spoke with patient's daughter [name] over the phone with patient's permission.  She reports the patient has not had a bowel movement since Friday and has been complaining of rectal pain and abdominal pain for the last few days.  She has been in bed since Sunday which patient's daughter reports is a departure from her baseline.  She has been administering Dulcolax suppositories and the patient also takes a stool softener daily.  She is also been administering some Vaseline/oil around the rectum with little relief.  She does note that the patient has not been taking MiraLAX.  She has had decreased intake over the last couple of days.     The history is provided by the patient.       Past Medical History:  Diagnosis Date  . Acute respiratory failure ([place])   . Anemia    previous blood transfusions  . Arthritis    "all over"  . Asthma   . Atrial flutter ([place])   . Bradycardia    requiring previous d/c of BB and reduction of amiodarone  . CAD (coronary artery  disease)    nonobstructive per notes  . Chronic diastolic CHF (congestive heart failure) ([place])   . CKD (chronic kidney disease), stage III   . Complication of blood transfusion    "got the wrong blood type at [facility] in ~ 2015; no adverse reaction that we are aware of"/daughter, [name] (01/27/2016)  . COPD (chronic obstructive pulmonary disease) ([place])   . Depression    "light case"  . DVT (deep venous thrombosis) ([place]) 01/2016   a. LLE DVT 01/2016 - switched from Eliquis to Coumadin.  [name] Dyspnea    with some activity  . Gastric stenosis    a. s/p stomach tube  . GERD (gastroesophageal reflux disease)   . History of blood transfusion    "several" (01/27/2016)  . History of stomach ulcers   . Hyperlipidemia   . Hypertension   . Hypothyroidism   . On home oxygen therapy    "2L; 9PM til 9AM" (06/27/2017)  . PAD (peripheral artery disease) ([place])    a. prior gangrene, toe amputations, intervention.  [name] PAF (paroxysmal atrial fibrillation) ([place])   . Paraesophageal hernia   . Perforated gastric ulcer ([place])   . Pneumonia    "a few times" (06/27/2017)  . Seasonal allergies   . SIADH (syndrome of inappropriate ADH production) ([place])    [name] 01/10/2015  . Small bowel obstruction ([place])    "I don't know how many" (01/11/2015)  .  Stroke [facility])    several- no residual  . Type II diabetes mellitus ([place])    "related to prednisone use  for > 20 yr; once predinose stopped; no more DM RX" (01/27/2016)  . UTI (urinary tract infection) 02/08/2016  . Ventral hernia with bowel obstruction     Patient Active Problem List   Diagnosis Date Noted  . Chest pain 08/27/2019  . Lab test positive for detection of COVID-19 virus 08/27/2019  . Acute encephalopathy 07/07/2019  . AMS (altered mental status) 07/07/2019  . Varicose veins of right lower extremity with ulcer other part of foot ([place]) 06/11/2019  . Jejunostomy tube fell out 03/02/2019  . S/P amputation of lesser toe, left ([place]) 02/13/2019  .  BRBPR (bright red blood per rectum) 01/10/2019  . GI bleed 12/25/2018  . Bradycardia 11/25/2018  . Amputated great toe of right foot ([place]) 05/06/2018  . Peripheral vascular disease ([place]) 05/03/2018  . Localized, primary osteoarthritis of shoulder region 01/21/2018  . PAD (peripheral artery disease) ([place]) 06/27/2017  . AKI (acute kidney injury) ([place]) 02/20/2017  . Complication of feeding tube ([place]) 02/20/2017  . PEG tube malfunction ([place])   . COPD with asthma ([place]) 10/03/2016  . Chronic deep vein thrombosis (DVT) of tibial vein of left lower extremity ([place]) 10/03/2016  . Gangrene of toe of left foot ([place]) 09/15/2016  . Recurrent aspiration pneumonia ([place])   . Atherosclerosis of native arteries of the extremities with gangrene ([place]) 07/28/2016  . Other specified hypothyroidism   . CHF (NYHA class IV, ACC/AHA stage D) ([place])   . Shortness of breath   . Pressure injury of skin 03/11/2016  . PEG (percutaneous endoscopic gastrostomy) status ([place]) 03/10/2016  . Encounter for therapeutic drug monitoring 02/16/2016  . Warfarin anticoagulation   . Chronic seasonal allergic rhinitis   . Atrial fibrillation and flutter ([place]) 01/24/2016  . Chronic diastolic CHF (congestive heart failure) ([place]) 01/24/2016  . Bilateral leg edema 12/29/2015  . Hypothyroidism due to medication   . Weakness   . Pyloric stenosis   . Pressure ulcer 09/15/2015  . PVD (peripheral vascular disease) ([place])   . Diabetic ulcer of toe of right foot associated with type 2 diabetes mellitus, with necrosis of muscle ([place])   . Diverticulitis of large intestine without perforation or abscess without bleeding   . Colitis 08/26/2015  . Esophageal candidiasis ([place])   . SOB (shortness of breath)   . Acute renal failure superimposed on stage 4 chronic kidney disease ([place])   . Encounter for long-term (current) use of other high-risk medications   . Dysphagia 07/05/2015  . Asthma with allergic rhinitis 06/04/2015  . Hypertensive  heart disease 03/18/2015  . Malnutrition of moderate degree 01/28/2015  . Syncope 01/27/2015  . Symptomatic bradycardia 01/21/2015  . Hypotension due to drugs 01/21/2015  . Protein-calorie malnutrition, severe 01/12/2015  . Hypertension associated with diabetes ([place]) 01/12/2015  . Cerebrovascular disease 07/14/2014  . Diabetic polyneuropathy associated with type 2 diabetes mellitus ([place]) 07/14/2014  . Hyperlipidemia 07/14/2014  . Gastric outlet obstruction 05/21/2014  . Unstable gait 04/29/2014  . Slurred speech   . Gastroparesis   . Constipation 02/03/2014  . Carotid stenosis 02/03/2014  . Demand ischemia ([place]) 01/21/2014  . Gastric bezoar 01/20/2014  . Anemia of chronic disease 01/18/2014  . CKD (chronic kidney disease) stage 3, GFR 30-59 ml/min 01/18/2014  . Depression 09/29/2013  . Essential hypertension, benign 09/22/2013  . GERD (gastroesophageal reflux disease) 09/22/2013  . Hyperlipidemia associated with type 2  diabetes mellitus ([place]) 09/22/2013  . Type 2 diabetes mellitus with chronic kidney disease ([place]) 09/22/2013  . Persistent atrial fibrillation 09/22/2013  . History of completed stroke 09/22/2013  . Diabetic neuropathy ([place]) 09/22/2013  . Hypothyroidism 09/22/2013  . Chronic gastrojejunal ulcer with perforation ([place]) 09/07/2013  . Accelerated hypertension 09/07/2013  . Systemic lupus erythematosus (SLE) inhibitor ([place]) 09/07/2013  . Other specified abnormal immunological findings in serum 09/07/2013  . Red blood cell antibody positive 09/07/2013    Past Surgical History:  Procedure Laterality Date  . ABDOMINAL AORTOGRAM N/A 09/21/2016   Procedure: Abdominal Aortogram;  Surgeon: Waynetta Sandy, MD;  Location: [facility];  Service: Cardiovascular;  Laterality: N/A;  . AMPUTATION Left 09/25/2016   Procedure: AMPUTATION DIGIT- LEFT 2ND AND 3RD TOES;  Surgeon: Rosetta Posner, MD;  Location: [facility];  Service: Vascular;  Laterality: Left;  . AMPUTATION  Right 05/03/2018   Procedure: AMPUTATION RIGHT GREAT TOE;  Surgeon: Rosetta Posner, MD;  Location: [facility];  Service: Vascular;  Laterality: Right;  . CATARACT EXTRACTION W/ INTRAOCULAR LENS  IMPLANT, BILATERAL    . CHOLECYSTECTOMY OPEN    . COLECTOMY    . ESOPHAGOGASTRODUODENOSCOPY N/A 01/19/2014   Procedure: ESOPHAGOGASTRODUODENOSCOPY (EGD);  Surgeon: Irene Shipper, MD;  Location[facility];  Service: Endoscopy;  Laterality: N/A;  . ESOPHAGOGASTRODUODENOSCOPY N/A 01/20/2014   Procedure: ESOPHAGOGASTRODUODENOSCOPY (EGD);  Surgeon: Irene Shipper, MD;  Location: [facility];  Service: Endoscopy;  Laterality: N/A;  . ESOPHAGOGASTRODUODENOSCOPY N/A 03/19/2014   Procedure: ESOPHAGOGASTRODUODENOSCOPY (EGD);  Surgeon: Milus Banister, MD;  Location: [facility];  Service: Endoscopy;  Laterality: N/A;  . ESOPHAGOGASTRODUODENOSCOPY N/A 07/08/2015   Procedure: ESOPHAGOGASTRODUODENOSCOPY (EGD);  Surgeon: Doran Stabler, MD;  Location[facility] ENDOSCOPY;  Service: Endoscopy;  Laterality: N/A;  . ESOPHAGOGASTRODUODENOSCOPY (EGD) WITH PROPOFOL N/A 09/15/2015   Procedure: ESOPHAGOGASTRODUODENOSCOPY (EGD) WITH PROPOFOL;  Surgeon: Manus Gunning, MD;  [facility];  Service: Gastroenterology;  Laterality: N/A;  . GASTROJEJUNOSTOMY     hx/notes 01/10/2015  . GASTROJEJUNOSTOMY N/A 09/23/2015   Procedure: OPEN GASTROJEJUNOSTOMY TUBE PLACEMENT;  Surgeon: Arta Bruce Kinsinger, MD;  Location: [facility];  Service: General;  Laterality: N/A;  . GLAUCOMA SURGERY Bilateral   . HERNIA REPAIR  2015  . IR CM INJ ANY COLONIC TUBE W/FLUORO  01/05/2017  . IR CM INJ ANY COLONIC TUBE W/FLUORO  06/07/2017  . IR CM INJ ANY COLONIC TUBE W/FLUORO  11/05/2017  . IR CM INJ ANY COLONIC TUBE W/FLUORO  09/18/2018  . IR CM INJ ANY COLONIC TUBE W/FLUORO  03/06/2019  . IR CM INJ ANY COLONIC TUBE W/FLUORO  03/11/2019  . IR GENERIC HISTORICAL  01/07/2016   IR GJ TUBE CHANGE 01/07/2016 Jacqulynn Cadet, MD [facility]  . IR GENERIC  HISTORICAL  01/27/2016   IR MECH REMOV OBSTRUC MAT ANY COLON TUBE W/FLUORO 01/27/2016 Markus Daft, MD [facility]  . IR GENERIC HISTORICAL  02/07/2016   IR PATIENT EVAL TECH 0-60 MINS Darrell K Allred, PA-C [facility]  . IR GENERIC HISTORICAL  02/08/2016   IR GJ TUBE CHANGE 02/08/2016 Greggory Keen, MD [facility]  . IR GENERIC HISTORICAL  01/06/2016   IR GJ TUBE CHANGE 01/06/2016 [facility] OUT REF  . IR GENERIC HISTORICAL  05/02/2016   IR CM INJ ANY COLONIC TUBE W/FLUORO 05/02/2016 Arne Cleveland, MD [facility]  . IR GENERIC HISTORICAL  05/15/2016   IR GJ TUBE CHANGE 05/15/2016 Sandi Mariscal, MD [facility]  . IR GENERIC HISTORICAL  06/28/2016   IR GJ TUBE CHANGE 06/28/2016 [facility]  . IR GJ TUBE CHANGE  02/20/2017  . IR GJ TUBE CHANGE  05/10/2017  . IR GJ TUBE CHANGE  07/06/2017  . IR GJ TUBE CHANGE  08/02/2017  . IR GJ TUBE CHANGE  10/15/2017  . IR GJ TUBE CHANGE  12/26/2017  . IR [place] TUBE CHANGE  04/08/2018  . IR [place] TUBE CHANGE  06/19/2018  . IR [place] TUBE CHANGE  03/03/2019  . IR [place] TUBE CHANGE  06/06/2019  . IR PATIENT EVAL TECH 0-60 MINS  10/19/2016  . IR PATIENT EVAL TECH 0-60 MINS  12/25/2016  . IR PATIENT EVAL TECH 0-60 MINS  01/29/2017  . IR PATIENT EVAL TECH 0-60 MINS  04/04/2017  . IR PATIENT EVAL TECH 0-60 MINS  04/30/2017  . IR PATIENT EVAL TECH 0-60 MINS  07/31/2017  . IR REPLC DUODEN/JEJUNO TUBE PERCUT W/FLUORO  11/14/2016  . LAPAROTOMY N/A 01/20/2015   Procedure: EXPLORATORY LAPAROTOMY;  Surgeon: Coralie Keens, MD;  Location: [facility];  Service: General;  Laterality: N/A;  . LOWER EXTREMITY ANGIOGRAPHY Left 09/21/2016   Procedure: Lower Extremity Angiography;  Surgeon: Waynetta Sandy, MD;  Location: [facility];  Service: Cardiovascular;  Laterality: Left;  . LOWER EXTREMITY ANGIOGRAPHY Right 06/27/2017   Procedure: Lower Extremity Angiography;  Surgeon: Waynetta Sandy, MD;  Location: [facility];  Service: Cardiovascular;  Laterality: Right;  . LYSIS  OF ADHESION N/A 01/20/2015   Procedure: LYSIS OF ADHESIONS < 1 HOUR;  Surgeon: Coralie Keens, MD;  Location: [facility];  Service: General;  Laterality: N/A;  . PERIPHERAL VASCULAR BALLOON ANGIOPLASTY Left 09/21/2016   Procedure: Peripheral Vascular Balloon Angioplasty;  Surgeon: Waynetta Sandy, MD;  Location: [facility];  Service: Cardiovascular;  Laterality: Left;  drug coated balloon  . PERIPHERAL VASCULAR BALLOON ANGIOPLASTY Right 06/27/2017   Procedure: PERIPHERAL VASCULAR BALLOON ANGIOPLASTY;  Surgeon: Waynetta Sandy, MD;  Location: [facility];  Service: Cardiovascular;  Laterality: Right;  SFA/TPTRUNK  . TONSILLECTOMY    . TUBAL LIGATION    . VENTRAL HERNIA REPAIR  2015   incarcerated ventral hernia ([facility] 09/2013)/notes 01/10/2015     OB History   No obstetric history on file.     Family History  Problem Relation Age of Onset  . Stroke Mother   . Hypertension Mother   . Diabetes Brother   . Glaucoma Son   . Glaucoma Son   . Heart attack Neg Hx   . Colon cancer Neg Hx   . Stomach cancer Neg Hx     Social History   Tobacco Use  . Smoking status: Never Smoker  . Smokeless tobacco: Former Systems developer    Types: Snuff  . Tobacco comment: "used snuff in my younger days"  Vaping Use  . Vaping Use: Never used  Substance Use Topics  . Alcohol use: Never    Alcohol/week: 0.0 standard drinks  . Drug use: Never    Home Medications Prior to Admission medications   Medication Sig Start Date End Date Taking? Authorizing Provider  Amino Acids-Protein Hydrolys (FEEDING SUPPLEMENT, PRO-STAT SUGAR FREE 64,) LIQD Take 30 mLs by mouth 2 (two) times daily.   Yes [provider]  amLODipine (NORVASC) 5 MG tablet Take 1 tablet (5 mg total) by mouth daily. 11/26/18 11/26/19 Yes Spongberg, Audie Pinto, MD  atorvastatin (LIPITOR) 10 MG tablet TAKE 1 TABLET BY MOUTH EVERY DAY Patient taking differently: Take 10  mg by mouth daily.  06/25/19  Yes [name], NP  b complex vitamins tablet Take 1 tablet by mouth daily.    Yes [provider]  calcium-vitamin D (OSCAL WITH D) 500-200 MG-UNIT per tablet Take 1 tablet by mouth daily with breakfast.    Yes [provider]  Carboxymethylcellulose Sodium (ARTIFICIAL TEARS OP) Place 1 drop into both eyes 4 (four) times daily.   Yes [provider]  cycloSPORINE (RESTASIS) 0.05 % ophthalmic emulsion USE 1 DROP INTO BOTH EYES TWICE DAILY Patient taking differently: Place 1 drop into both eyes 2 (two) times daily.  01/29/17  Yes Carter, [place], DO  DULERA 200-5 MCG/ACT AERO TAKE TWO PUFFS BY MOUTH TWICE DAILY Patient taking differently: Inhale 2 puffs into the lungs in the morning and at bedtime.  01/13/19  Yes [name], NP  feeding supplement (OSMOLITE 1 CAL) LIQD Take 711 mLs by mouth in the morning and at bedtime.    Yes [provider]  ferrous sulfate 220 (44 Fe) MG/5ML solution TAKE 7.4 MLS (325 MG TOTAL) BY MOUTH DAILY. 09/03/19  Yes [name], NP  fluticasone (FLONASE) 50 MCG/ACT nasal spray SPRAY 2 SPRAYS INTO EACH NOSTRIL EVERY DAY Patient taking differently: Place 2 sprays into both nostrils in the morning and at bedtime. SPRAY 2 SPRAYS INTO EACH NOSTRIL EVERY DAY 06/25/19  Yes [name], NP  furosemide (LASIX) 20 MG tablet Take 20 mg by mouth.   Yes [provider]  gabapentin (NEURONTIN) 250 MG/5ML solution Take 6 mLs (300 mg total) by mouth 2 (two) times daily. 06/10/19  Yes [name], NP  gentamicin cream (GARAMYCIN) 0.1 % Apply 1 application topically 3 (three) times daily as needed (dry skin). Patient taking differently: Apply 1 application topically daily as needed (dry skin).  07/16/19  Yes Edrick Kins, DPM  hydrALAZINE (APRESOLINE) 50 MG tablet TAKE 1 TABLET BY MOUTH THREE TIMES A DAY Patient taking differently: Take 50 mg by mouth 3 (three) times daily.  08/04/19  Yes Dorothy Spark, MD  insulin aspart  (NOVOLOG) 100 UNIT/ML FlexPen Inject 0-6 Units into the skin 3 (three) times daily with meals. New sliding Scale If BS is 151 - 251 = 2 units,  If BS is 251 -  350 = 4 units,  If BS is 351 - 450 = 6 units Call provider if BS runs more than 400 Patient taking differently: Inject 0-6 Units into the skin See admin instructions. New sliding Scale If BS is 151 - 251 = 2 units,  If BS is 251 -  350 = 4 units,  If BS is 351 - 450 = 6 units Call provider if BS runs more than 400  02/03/19  Yes Eubanks, [name], NP  Insulin Glargine (LANTUS SOLOSTAR) 100 UNIT/ML Solostar Pen Inject 8 Units into the skin at bedtime. Patient taking differently: Inject 8 Units into the skin at bedtime. Takes if sugar level is over 150 02/20/19  Yes Eubanks, [name], NP  isosorbide mononitrate (IMDUR) 30 MG 24 hr tablet TAKE 2 TABLETS BY MOUTH DAILY. PLEASE MAKE ANNUAL APPT WITH DR. Meda Coffee FOR FUTURE REFILLS Patient taking differently: Take 60 mg by mouth daily.  09/15/19  Yes Dorothy Spark, MD  latanoprost (XALATAN) 0.005 % ophthalmic solution Place 1 drop into both eyes at bedtime. 06/02/14  Yes Blanchie Serve, MD  levothyroxine (SYNTHROID) 125 MCG tablet TAKE 1 TABLET BY MOUTH EVERY DAY Patient taking differently:  Take 125 mcg by mouth daily before breakfast.  09/08/19  Yes Eubanks, [name], NP  loratadine (CLARITIN) 10 MG tablet Take 10 mg by mouth daily as needed for allergies.   Yes [provider]  Multiple Vitamin (MULTIVITAMIN WITH MINERALS) TABS tablet Take 1 tablet by mouth daily.   Yes [provider]  nystatin (MYCOSTATIN/NYSTOP) powder Apply 1 application topically as needed (skin irritation). 09/11/19  Yes Ngetich, Dinah C, NP  oxyCODONE (ROXICODONE) 5 MG immediate release tablet Take 1 tablet (5 mg total) by mouth every 4 (four) hours as needed for severe pain. May take 1/2 tab q4hrs as needed for mild pain. Patient taking differently: Take 5 mg by mouth every 4 (four) hours as needed for  moderate pain or severe pain.  08/11/19  Yes [name], NP  pantoprazole (PROTONIX) 40 MG tablet TAKE 1 TABLET BY MOUTH EVERY DAY Patient taking differently: Take 40 mg by mouth.  09/24/19  Yes Dorothy Spark, MD  Probiotic Product (EQL PROBIOTIC COLON SUPPORT PO) Take 1 capsule by mouth daily.   Yes [provider]  sertraline (ZOLOFT) 50 MG tablet Take 50 mg by mouth daily.   Yes [provider]  warfarin (COUMADIN) 2.5 MG tablet TAKE 2-3 TABLETS BY MOUTH DAILY AS DIRECTED BY COUMADIN CLINIC. Patient taking differently: Take 3-7.5 mg by mouth See admin instructions. Takes 3 tablets by mouth on Sunday and Thursday and 2 tablets on all other days 08/25/19  Yes Dorothy Spark, MD  White Petrolatum-Mineral Oil (SYSTANE NIGHTTIME) OINT Place 1 application into both eyes at bedtime.    Yes [provider]  acetaminophen (TYLENOL) 500 MG tablet Take 500 mg by mouth every 6 (six) hours as needed (pain).     [provider]  benzocaine (AMERICAINE) 20 % rectal ointment Place rectally every 3 (three) hours as needed for pain. 09/24/19   Nils Flack, Mina A, PA-C  furosemide (LASIX) 8 MG/ML solution 20 mg (2.5 ml) daily unless swelling occurs take 40 mg (5 ml) and then switch back to 20 mg daily Patient not taking: Reported on 09/24/2019 06/10/19   [name], NP  Insulin Pen Needle (B-D UF III MINI PEN NEEDLES) 31G X 5 MM MISC Use twice daily with giving insulin injections. Dx E11.9 07/17/18   [name], NP  ipratropium-albuterol (DUONEB) 0.5-2.5 (3) MG/3ML SOLN Take 3 mLs by nebulization every 6 (six) hours as needed (sob). Use 3 times daily x 4 days then every 6 hours as needed. Patient not taking: Reported on 09/24/2019 03/21/16   Eileen Stanford, PA-C  levalbuterol [facility] HFA) 45 MCG/ACT inhaler Inhale 2 puffs into the lungs every 6 (six) hours as needed for wheezing or shortness of breath. Patient not taking: Reported on 09/24/2019 02/03/16    Gildardo Cranker, DO  senna-docusate (SENOKOT-S) 8.6-50 MG tablet Take 1 tablet by mouth at bedtime as needed for mild constipation or moderate constipation. 09/24/19   Long, Wonda Olds, MD  sertraline (ZOLOFT) 20 MG/ML concentrated solution Take 2.5 mLs (50 mg total) by mouth daily. Patient not taking: Reported on 09/24/2019 06/10/19   [name], NP  Water For Irrigation, Sterile (FREE WATER) SOLN Place 180 mLs into feeding tube 3 (three) times daily.     [provider]    Allergies    Penicillins  Review of Systems   Review of Systems  Constitutional: Negative for chills and fever.  Respiratory: Positive for shortness of breath.   Cardiovascular:  Negative for chest pain.  Gastrointestinal: Positive for abdominal distention, abdominal pain and constipation. Negative for nausea and vomiting.  All other systems reviewed and are negative.   Physical Exam Updated Vital Signs BP (!) 151/69   Pulse 72   Temp 99.5 F (37.5 C)   Resp 15   SpO2 100%   Physical Exam Vitals and nursing note reviewed.  Constitutional:      General: She is not in acute distress.    Appearance: She is well-developed.  HENT:     Head: Normocephalic and atraumatic.  Eyes:     General:        Right eye: No discharge.        Left eye: No discharge.     Conjunctiva/sclera: Conjunctivae normal.  Neck:     Vascular: No JVD.     Trachea: No tracheal deviation.  Cardiovascular:     Rate and Rhythm: Normal rate.  Pulmonary:     Effort: Pulmonary effort is normal.  Abdominal:     General: Abdomen is protuberant. A surgical scar is present. Bowel sounds are decreased. There is no distension.     Tenderness: There is generalized abdominal tenderness. There is no right CVA tenderness, left CVA tenderness or rebound.     Comments: Large midline surgical scar.  G-tube to the left side of the abdomen.  Dressing is in place.  Generalized tenderness to palpation, no focal tenderness.  Lower abdomen feels  firm, upper abdomen is more soft.  Genitourinary:    Comments: Examination performed in the presence of a chaperone.  No frank rectal bleeding.  Scant amount of light brown stool in the rectal vault.  Some perirectal tenderness noted. Skin:    General: Skin is warm and dry.     Findings: No erythema.  Neurological:     Mental Status: She is alert.  Psychiatric:        Behavior: Behavior normal.     ED Results / Procedures / Treatments   Labs (all labs ordered are listed, but only abnormal results are displayed) Labs Reviewed  COMPREHENSIVE METABOLIC PANEL - Abnormal; Notable for the following components:      Result Value   BUN 49 (*)    Creatinine, Ser 1.33 (*)    Albumin 3.4 (*)    GFR calc non Af Amer 34 (*)    GFR calc Af Amer 40 (*)    All other components within normal limits  CBC WITH DIFFERENTIAL/PLATELET - Abnormal; Notable for the following components:   RBC 2.93 (*)    Hemoglobin 9.1 (*)    HCT 26.8 (*)    All other components within normal limits  LIPASE, BLOOD  URINALYSIS, ROUTINE W REFLEX MICROSCOPIC  POC OCCULT BLOOD, ED    EKG None  Radiology CT ABDOMEN PELVIS W CONTRAST  Result Date: 09/24/2019 CLINICAL DATA:  Constipation and hernia EXAM: CT ABDOMEN AND PELVIS WITH CONTRAST TECHNIQUE: Multidetector CT imaging of the abdomen and pelvis was performed using the standard protocol following bolus administration of intravenous contrast. CONTRAST:  57mL OMNIPAQUE IOHEXOL 300 MG/ML  SOLN COMPARISON:  07/07/2019 FINDINGS: LOWER CHEST: Normal. HEPATOBILIARY: Scattered hypodense foci, too small to characterize accurately, but most likely hepatic cysts. Otherwise normal liver. No biliary dilatation. Status post cholecystectomy. PANCREAS: Normal pancreas. No ductal dilatation or peripancreatic fluid collection. SPLEEN: Normal. ADRENALS/URINARY TRACT: The adrenal glands are normal. No hydronephrosis, nephroureterolithiasis or solid renal mass. The urinary bladder is  normal for degree of distention STOMACH/BOWEL: There  is a percutaneous jejunostomy tube with tip in the proximal jejunum. There is a large ventral abdominal hernia that contains a segment of the transverse colon. No small bowel dilatation or inflammation. The appendix is not visualized. No right lower quadrant inflammation or free fluid. VASCULAR/LYMPHATIC: There is calcific atherosclerosis of the abdominal aorta. No abdominal or pelvic lymphadenopathy. REPRODUCTIVE: Normal uterus.  No adnexal mass. MUSCULOSKELETAL. No bony spinal canal stenosis or focal osseous abnormality. OTHER: None. IMPRESSION: 1. Large ventral abdominal hernia that contains a segment of the transverse colon. No evidence of obstruction or inflammation. 2. Percutaneous jejunostomy tube with tip in the proximal jejunum. 3. Aortic Atherosclerosis (ICD10-I70.0). Electronically Signed   By: Ulyses Jarred M.D.   On: 09/24/2019 21:27   DG Abdomen Acute W/Chest  Result Date: 09/24/2019 CLINICAL DATA:  Constipation.  Pain around gastrostomy tube site. EXAM: DG ABDOMEN ACUTE W/ 1V CHEST COMPARISON:  05/18/2019 FINDINGS: A gastrojejunostomy tube is identified within the abdomen. This appears similar in position to the previous exam. No dilated loops of bowel identified. Cholecystectomy clips noted. Asymmetric elevation of right hemidiaphragm. IMPRESSION: Nonobstructive bowel gas pattern. Unchanged position of gastrojejunostomy tube. Electronically Signed   By: Kerby Moors M.D.   On: 09/24/2019 20:00    Procedures Procedures (including critical care time)  Medications Ordered in ED Medications  sodium chloride (PF) 0.9 % injection (  Not Given 09/24/19 2219)  iohexol (OMNIPAQUE) 300 MG/ML solution 75 mL (75 mLs Intravenous Contrast Given 09/24/19 2054)  sodium chloride 0.9 % bolus 500 mL (0 mLs Intravenous Stopped 09/24/19 2312)    ED Course  I have reviewed the triage vital signs and the nursing notes.  Pertinent labs & imaging results  that were available during my care of the patient were reviewed by me and considered in my medical decision making (see chart for details).    MDM Rules/Calculators/A&P                          Patient presenting for evaluation of abdominal pain and constipation.  Mostly complaining of abdominal pain and rectal pain.  Physical examination does not suggest perirectal abscess or anal fissure.  Abdomen exhibits no rebound or guarding.  Lab work reviewed and interpreted by myself shows no leukocytosis, stable anemia, no metabolic derangements, stable renal insufficiency at patient's baseline.  UA does not suggest UTI or nephrolithiasis.  Lipase and LFTs are within normal limits.  Stool is Hemoccult negative.  Imaging today shows no evidence of obstruction, perforation, appendicitis, cholecystitis or other acute surgical abdominal pathology.  Does show a large ventral abdominal hernia with no evidence of incarceration/strangulation.  No evidence of secondary skin infection.  No evidence of PEG tube dysfunction.  Discussed lab work and findings with the patient's daughter over the phone.  No further emergent work-up required at this time.  I feel that patient is stable for discharge home with close follow-up with PCP.  We will start her on senna docusate and benzocaine rectal ointment for her symptoms.  Also recommend increasing MiraLAX intake as well as fluid intake and activity levels.  She will call the PCP tomorrow for further recommendations and to schedule following.  Discussed strict ED return precautions. Patient's daughter verbalized understanding of and agreement with plan the patient is stable for discharge at this time.  Patient was seen and evaluate by Dr. Laverta Baltimore who agrees with assessment and plan at this time.  Final Clinical Impression(s) / ED Diagnoses Final  diagnoses:  Constipation, unspecified constipation type  Generalized abdominal pain  Rectal pain    Rx / DC Orders ED Discharge  Orders         Ordered    senna-docusate (SENOKOT-S) 8.6-50 MG tablet  At bedtime PRN     Discontinue  Reprint     09/24/19 2258    benzocaine (AMERICAINE) 20 % rectal ointment  Every  3 hours PRN     Discontinue  Reprint     09/24/19 [employer], Mina A, PA-C 09/24/19 2323    Margette Fast, MD 09/25/19 2043

## 2019-09-24 NOTE — Discharge Instructions (Signed)
Tomorrow would recommend mixing 7 capfuls of MiraLAX in 32 ounces of fluid.  Do this once, after that can do 1-2 capfuls of MiraLAX in 8 ounces of fluid.  Start using senna-docusate for constipation.  Stop using the docusate suppositories when taking this.  You can also use the benzocaine numbing cream around the rectum to help with rectal pain.  Drink plenty of water to stay hydrated.  Try to stay active.  Please call the PCP tomorrow to update them and get further recommendations and arrange for follow-up appointment.  Return to the emergency department if any concerning signs or symptoms develop such as fevers, vomiting, uncontrolled pain, rectal bleeding, loss of consciousness.

## 2019-09-24 NOTE — ED Notes (Addendum)
Pt. POC Occult blood collected by PA, Mina.

## 2019-09-24 NOTE — ED Triage Notes (Signed)
Patient arrives by EMS with the complaints of constipation, no bowel movement since 6/18 per family, also has been complaining of pain around gtube site

## 2019-09-26 ENCOUNTER — Ambulatory Visit (HOSPITAL_COMMUNITY)
Admission: RE | Admit: 2019-09-26 | Discharge: 2019-09-26 | Disposition: A | Discharge status: Home / Self Care | Payer: Medicare Other | Source: Ambulatory Visit | Attending: Interventional Radiology | Admitting: Interventional Radiology

## 2019-09-26 ENCOUNTER — Other Ambulatory Visit: Payer: Self-pay

## 2019-09-26 ENCOUNTER — Other Ambulatory Visit (HOSPITAL_COMMUNITY): Payer: Self-pay | Admitting: Interventional Radiology

## 2019-09-26 DIAGNOSIS — R633 Feeding difficulties, unspecified: Secondary | ICD-10-CM

## 2019-09-26 DIAGNOSIS — Z434 Encounter for attention to other artificial openings of digestive tract: Secondary | ICD-10-CM | POA: Diagnosis present

## 2019-09-26 HISTORY — PX: IR GJ TUBE CHANGE: IMG1440

## 2019-09-26 MED ORDER — LIDOCAINE VISCOUS HCL 2 % MT SOLN
OROMUCOSAL | Status: AC
  Filled 2019-09-26: qty 15

## 2019-09-26 NOTE — Procedures (Signed)
Pre procedural Dx: Inadvertent removal of chronic GJ tube. Post procedural Dx: Same  Successful fluoroscopic guided replacement of exisitng 22 Fr GJ tube.   The feeding tube is ready for immediate use.  EBL: None  Complications: None immediate.  Ronny Bacon, MD Pager #: [phone]

## 2019-09-30 ENCOUNTER — Ambulatory Visit: Payer: Medicare Other | Admitting: Podiatry

## 2019-10-01 ENCOUNTER — Other Ambulatory Visit: Payer: Self-pay

## 2019-10-01 ENCOUNTER — Ambulatory Visit (INDEPENDENT_AMBULATORY_CARE_PROVIDER_SITE_OTHER): Payer: Medicare Other | Admitting: Podiatry

## 2019-10-01 DIAGNOSIS — E0859 Diabetes mellitus due to underlying condition with other circulatory complications: Secondary | ICD-10-CM

## 2019-10-01 DIAGNOSIS — I739 Peripheral vascular disease, unspecified: Secondary | ICD-10-CM

## 2019-10-01 DIAGNOSIS — I70235 Atherosclerosis of native arteries of right leg with ulceration of other part of foot: Secondary | ICD-10-CM

## 2019-10-01 DIAGNOSIS — L97512 Non-pressure chronic ulcer of other part of right foot with fat layer exposed: Secondary | ICD-10-CM

## 2019-10-01 MED ORDER — GENTAMICIN SULFATE 0.1 % EX CREA: 1.0000 "application " | TOPICAL_CREAM | Freq: Every day | CUTANEOUS | 1 refills | Status: DC | PRN

## 2019-10-01 NOTE — Progress Notes (Signed)
Subjective:  86 y.o. female with PMHx of diabetes mellitus presenting today for follow up evaluation of chronic ulcerations of the right foot.  Patient states that she is feeling much better.  She no longer has any pain to the right second toe amputation stump.  There is no more drainage or inflammation also.  She does have some residual pain and tenderness to the distal stump of the right fourth toe amputation site with overlying ulcer.  Past Medical History:  Diagnosis Date  . Acute respiratory failure ([place])   . Anemia    previous blood transfusions  . Arthritis    "all over"  . Asthma   . Atrial flutter ([place])   . Bradycardia    requiring previous d/c of BB and reduction of amiodarone  . CAD (coronary artery disease)    nonobstructive per notes  . Chronic diastolic CHF (congestive heart failure) ([place])   . CKD (chronic kidney disease), stage III   . Complication of blood transfusion    "got the wrong blood type at [facility] in ~ 2015; no adverse reaction that we are aware of"/daughter, [name] (01/27/2016)  . COPD (chronic obstructive pulmonary disease) ([place])   . Depression    "light case"  . DVT (deep venous thrombosis) ([place]) 01/2016   a. LLE DVT 01/2016 - switched from Eliquis to Coumadin.  [name] Dyspnea    with some activity  . Gastric stenosis    a. s/p stomach tube  . GERD (gastroesophageal reflux disease)   . History of blood transfusion    "several" (01/27/2016)  . History of stomach ulcers   . Hyperlipidemia   . Hypertension   . Hypothyroidism   . On home oxygen therapy    "2L; 9PM til 9AM" (06/27/2017)  . PAD (peripheral artery disease) ([place])    a. prior gangrene, toe amputations, intervention.  [name] PAF (paroxysmal atrial fibrillation) ([place])   . Paraesophageal hernia   . Perforated gastric ulcer ([place])   . Pneumonia    "a few times" (06/27/2017)  . Seasonal allergies   . SIADH (syndrome of inappropriate ADH production) ([place])    [name] 01/10/2015  . Small bowel  obstruction ([place])    "I don't know how many" (01/11/2015)  . Stroke [facility])    several- no residual  . Type II diabetes mellitus ([place])    "related to prednisone use  for > 20 yr; once predinose stopped; no more DM RX" (01/27/2016)  . UTI (urinary tract infection) 02/08/2016  . Ventral hernia with bowel obstruction       Objective/Physical Exam General: The patient is alert and oriented x3 in no acute distress.  Dermatology:  Wounds noted to 2nd and 3rd and 4th digits of the right foot, both measuring 0.5 x 0.5 x 0.1 cm (LxWxD).  The ulcer to the distal tip of the second toe actually appears significantly improved with no more drainage and a dry stable granular wound base..  There does not appear to be any acute inflammation or infection noted.  No malodor noted.  To the noted ulceration(s), there is no eschar. There is a moderate amount of slough, fibrin, and necrotic tissue noted. Granulation tissue and wound base is red. There is a minimal amount of serosanguineous drainage noted.  The remaining ulcers to the third and fourth toes demonstrate no exposed bone muscle-tendon ligament or joint. There is no malodor. Periwound integrity is intact. Skin is warm, dry and supple bilateral lower extremities.  Vascular: Diminished pedal  pulses bilaterally. No edema or erythema noted. Capillary refill within normal limits.  Neurological: Epicritic and protective threshold diminished bilaterally.   Musculoskeletal Exam: History of partial first ray amputation right foot.  History of partial toe amputations right foot.  Assessment: 1. Ulcerations noted to digits 2 and 3 and 4 secondary to diabetes mellitus 2. diabetes mellitus w/ peripheral neuropathy   Plan of Care:  1. Patient was evaluated. 2. Medically necessary excisional debridement including muscle and deep fascial tissue was performed using a tissue nipper and a chisel blade. Excisional debridement of all the necrotic nonviable tissue down  to healthy bleeding viable tissue was performed with post-debridement measurements same as pre-.  Debridement of the second toe did have some bone involvement noted.  Portions of sclerotic bone were excised and removed through the distal tip ulceration.  Currently there does not appear to be any acute inflammatory response. 3. The wound was cleansed and dry sterile dressing applied. 4. Continue using Betadine daily.  5.  Continue wearing postoperative shoe as needed 6.  Return to clinic in 6 weeks   Edrick Kins, DPM [facility]  Dr. Edrick Kins, [address]                                        [place], [place] [address]                Office [phone]  Fax [phone]

## 2019-10-02 ENCOUNTER — Telehealth: Payer: Self-pay

## 2019-10-02 MED ORDER — POLYETHYLENE GLYCOL 3350 17 G PO PACK: 17.0000 g | PACK | Freq: Every day | ORAL | 2 refills | Status: DC

## 2019-10-02 NOTE — Telephone Encounter (Signed)
Patient was seen in the ER recently and told to restart Miralax. Patients daughter would like to know if Janett Billow will re-prescribed.   Patient was told to follow-up and already has a pending appointment. [name] did not feel the need to move appointment up to a sooner date.

## 2019-10-02 NOTE — Telephone Encounter (Signed)
Please advise on instructions, RX is pending

## 2019-10-02 NOTE — Telephone Encounter (Signed)
Noted and yes we can prescribe the miralax

## 2019-10-03 ENCOUNTER — Telehealth: Payer: Self-pay

## 2019-10-03 NOTE — Telephone Encounter (Signed)
Generally sitz bath are with warm water

## 2019-10-03 NOTE — Telephone Encounter (Signed)
Soledad Gerlach (daughter) called with concerns about Miralax that was sent to pharmacy. The pharmacy filled prescription for purelax and it is not covered by insurance. I called the pharmacy and was told that it's not covered by insurance because it is over the counter. Also, patient would like to get sitz bath, but needs solution for it for relief of rectum. Patient's daughter is going to call pharmacy herself about Miralax.

## 2019-10-08 ENCOUNTER — Ambulatory Visit (INDEPENDENT_AMBULATORY_CARE_PROVIDER_SITE_OTHER): Payer: Medicare Other | Admitting: *Deleted

## 2019-10-08 ENCOUNTER — Other Ambulatory Visit: Payer: Self-pay

## 2019-10-08 ENCOUNTER — Other Ambulatory Visit: Payer: Self-pay | Admitting: *Deleted

## 2019-10-08 DIAGNOSIS — I4892 Unspecified atrial flutter: Secondary | ICD-10-CM | POA: Diagnosis not present

## 2019-10-08 DIAGNOSIS — I4891 Unspecified atrial fibrillation: Secondary | ICD-10-CM

## 2019-10-08 DIAGNOSIS — Z5181 Encounter for therapeutic drug level monitoring: Secondary | ICD-10-CM

## 2019-10-08 LAB — POCT INR: INR: 1.9 — AB (ref 2.0–3.0)

## 2019-10-08 NOTE — Patient Instructions (Signed)
Description   Today take 3 tablets of Warfarin then continue on same dosage 2 tablets daily except 3 tablets on Sundays and Thursdays. Recheck in 4 weeks. Call Coumadin Clinic with any new medications [phone].

## 2019-10-08 NOTE — Patient Outreach (Signed)
[facility]) Care Management  10/08/2019  ELLYSIA CHAR 16-Aug-1923 [id number]   Call placed to member to follow up on management of chronic medical conditions. State this is not a good time to talk as she is getting ready for a clinic appointment.  Request made for member to call this care manager back, if no call back will follow up within the next 3-4 business days.  [name], [place], MSN [place] [phone]

## 2019-10-09 ENCOUNTER — Other Ambulatory Visit: Payer: Self-pay | Admitting: Nurse Practitioner

## 2019-10-09 DIAGNOSIS — G5793 Unspecified mononeuropathy of bilateral lower limbs: Secondary | ICD-10-CM

## 2019-10-09 IMAGING — US IR PICC >5YO
1 series · 3 of 3 positions shown · IV contrast (agent unspecified)
Comparison: none

INDICATION: Urinary tract infection who needs venous access for antibiotic
therapy.

EXAM:
ULTRASOUND AND FLUOROSCOPIC GUIDED PICC LINE INSERTION
MEDICATIONS:
1% lidocaine
CONTRAST:  None
FLUOROSCOPY TIME:  3 minutes and 42 seconds (3 mGy)
COMPLICATIONS:
None immediate.
TECHNIQUE: The procedure, risks, benefits, and alternatives were explained to
the patient and informed written consent was obtained. A timeout was
performed prior to the initiation of the procedure.

[Series 1: ir picc >5yo · 3 of 3 slices shown]
[im 1/3]
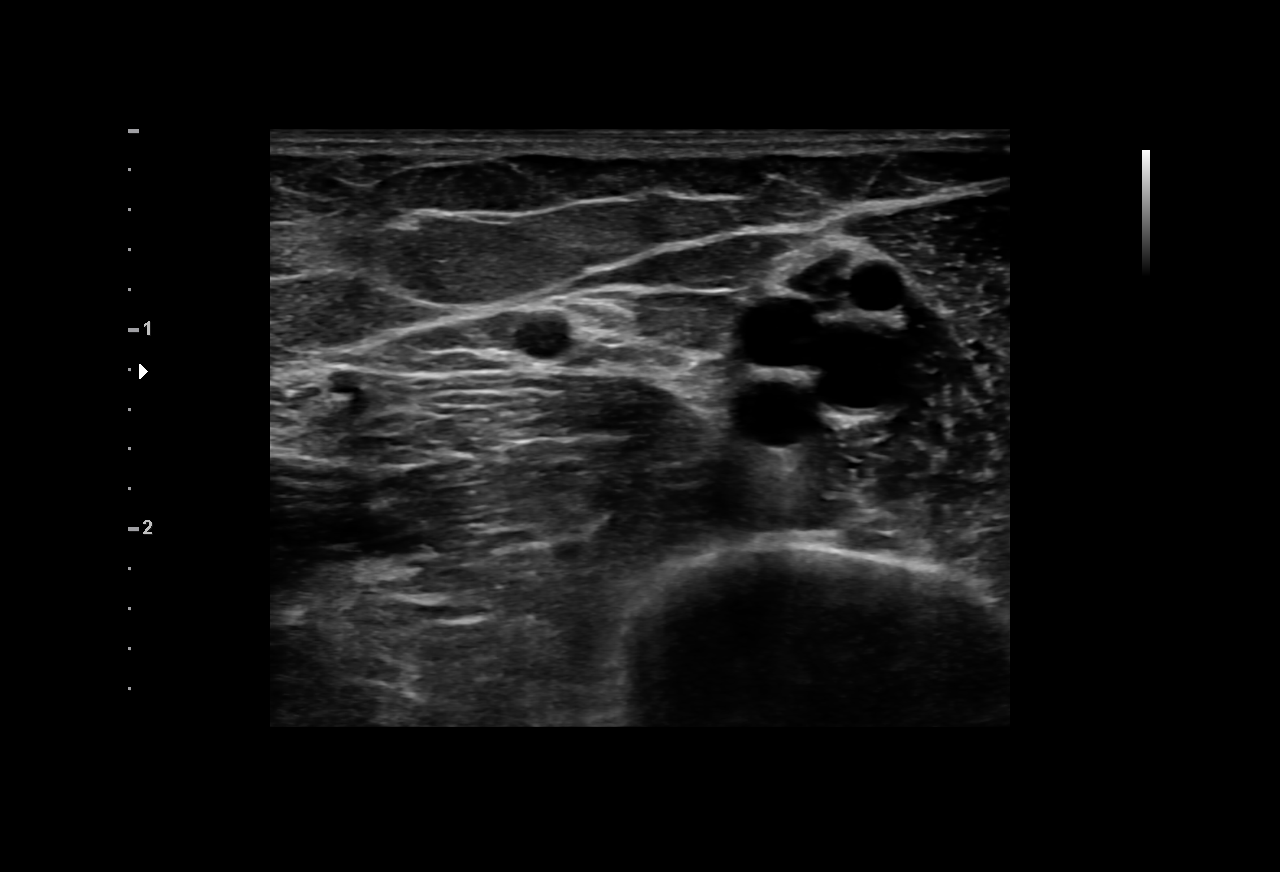
[im 2/3]
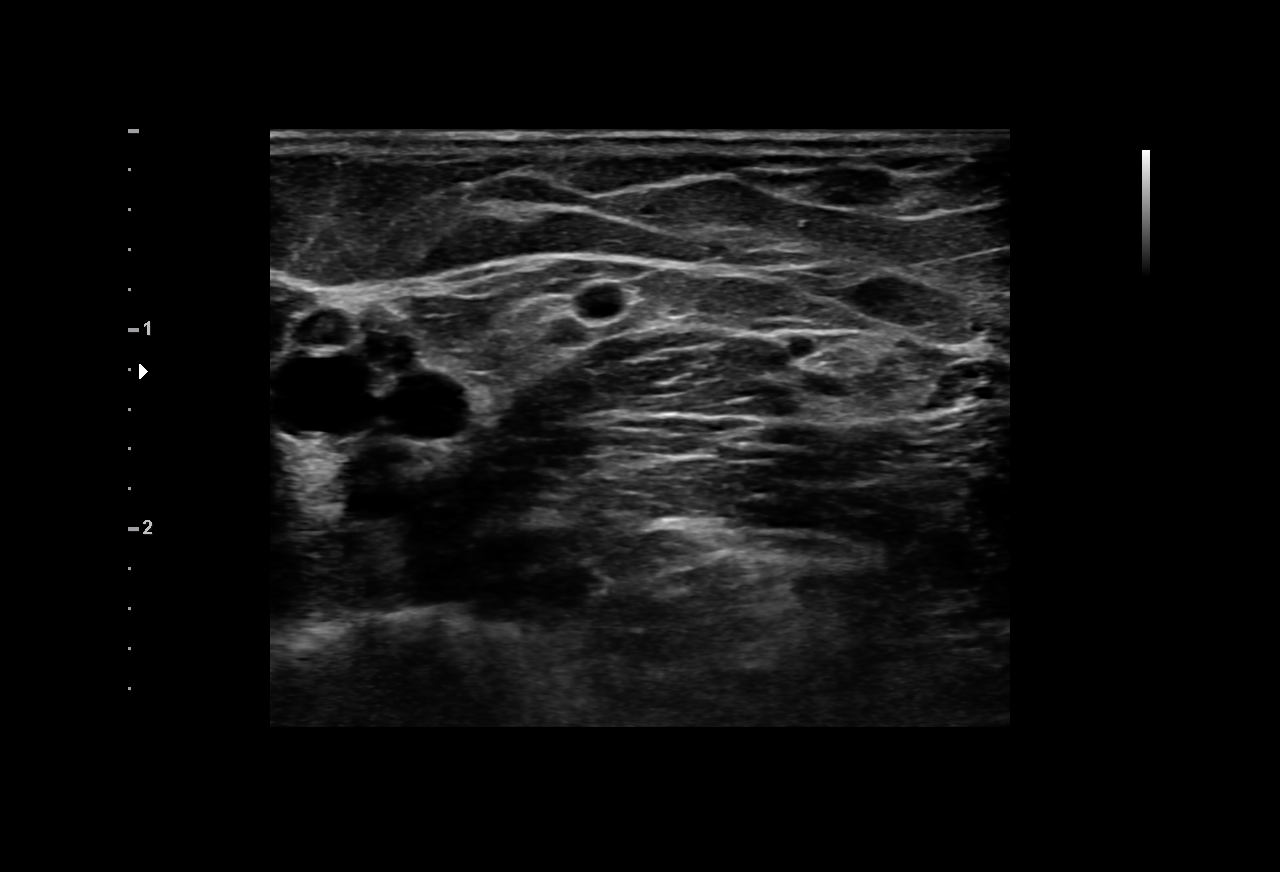
[im 3/3]
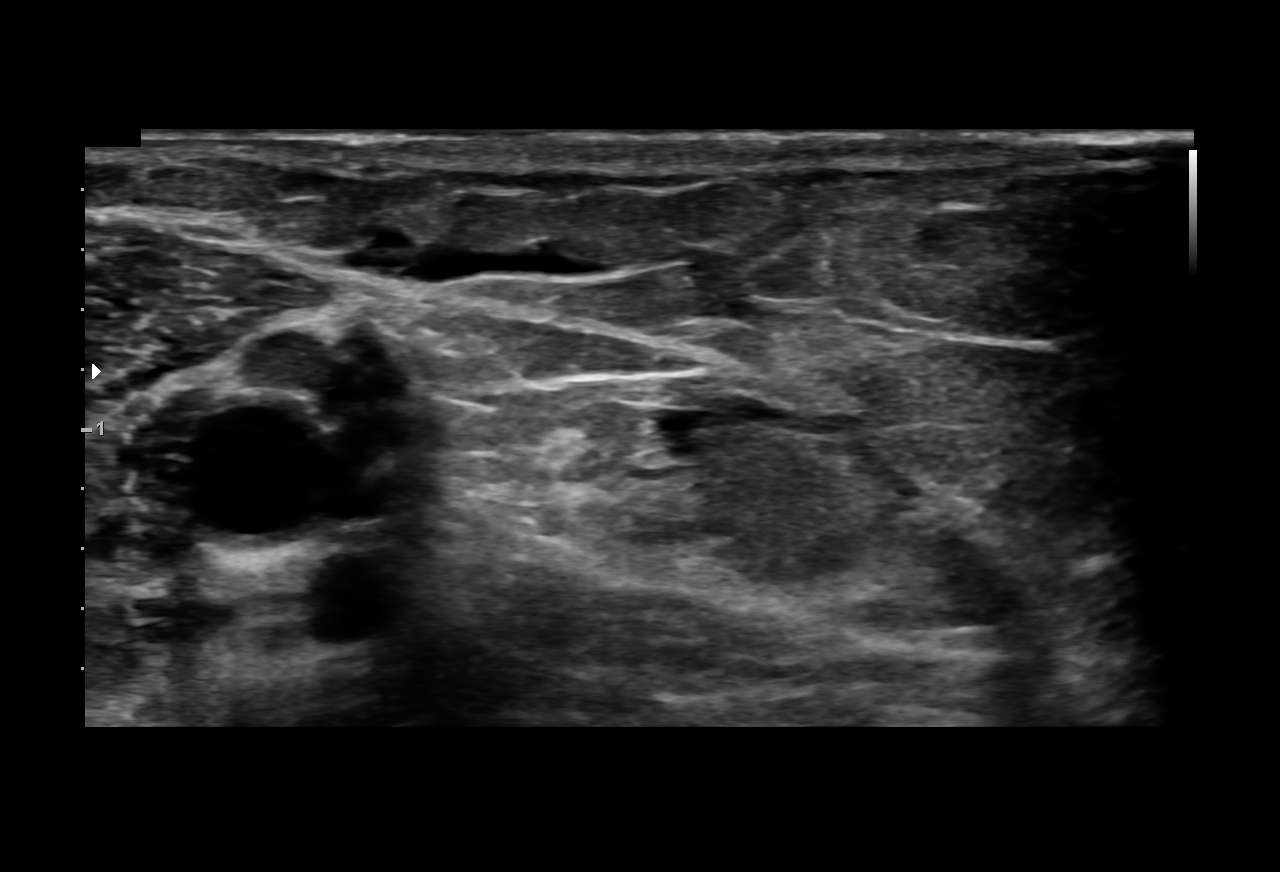

[3 of 3 positions shown; findings below may reference images not displayed]

The right upper extremity was prepped with chlorhexidine in a
sterile fashion, and a sterile drape was applied covering the
operative field. Maximum barrier sterile technique with sterile
gowns and gloves were used for the procedure. A timeout was
performed prior to the initiation of the procedure. Local anesthesia
was provided with 1% lidocaine.

Under direct ultrasound guidance, the right basilic vein was
accessed with a micropuncture kit after the overlying soft tissues
were anesthetized with 1% lidocaine. An ultrasound image was saved
for documentation purposes. A guidewire was advanced to the level of
the axillary vein but was unable to cannulate. The micro puncture
was then removed and under direct ultrasound guidance the right
brachial vein was then accessed. Once again the guidewire was unable
to be advanced past the axillary vein. At this time, the decision
was made to leave the catheter as a midline just prior to the
branching of the axillary vein. The PICC line was cut to length. A
peel-away sheath was placed and a 19 cm, 5 French, single lumen was
inserted to level just proximal of the axillary vein. The catheter
had to be pulled back 2-1/2 cm in order to have the catheter easily
aspirate and flush. Once in place a post procedure spot fluoroscopic
was obtained. The catheter easily aspirated and flushed and was
secured in place. A dressing was placed. The patient tolerated the
procedure well without immediate post procedural complication.
FINDINGS: After catheter placement, the tip lies just proximal to the axillary
vein. The catheter aspirates and flushes normally and is ready for
immediate use.
IMPRESSION: Successful ultrasound and fluoroscopic guided placement of a right
brachial vein approach, 19 cm, 5 French, single lumen midline
catheter to the level just proximal to the axillary vein. The
midline catheter line is ready for immediate use.

## 2019-10-10 ENCOUNTER — Ambulatory Visit: Payer: Medicare Other | Admitting: Nurse Practitioner

## 2019-10-11 IMAGING — XA IR REPLACE G/J TUBE W/ FLUORO
1 series · 2 of 2 positions shown · non-contrast
Comparison: none

INDICATION: Recurrent occlusion of jejunal limb of gastrojejunostomy catheter

[Series 300: tube placements · 2 of 2 slices shown]
[im 1/2]
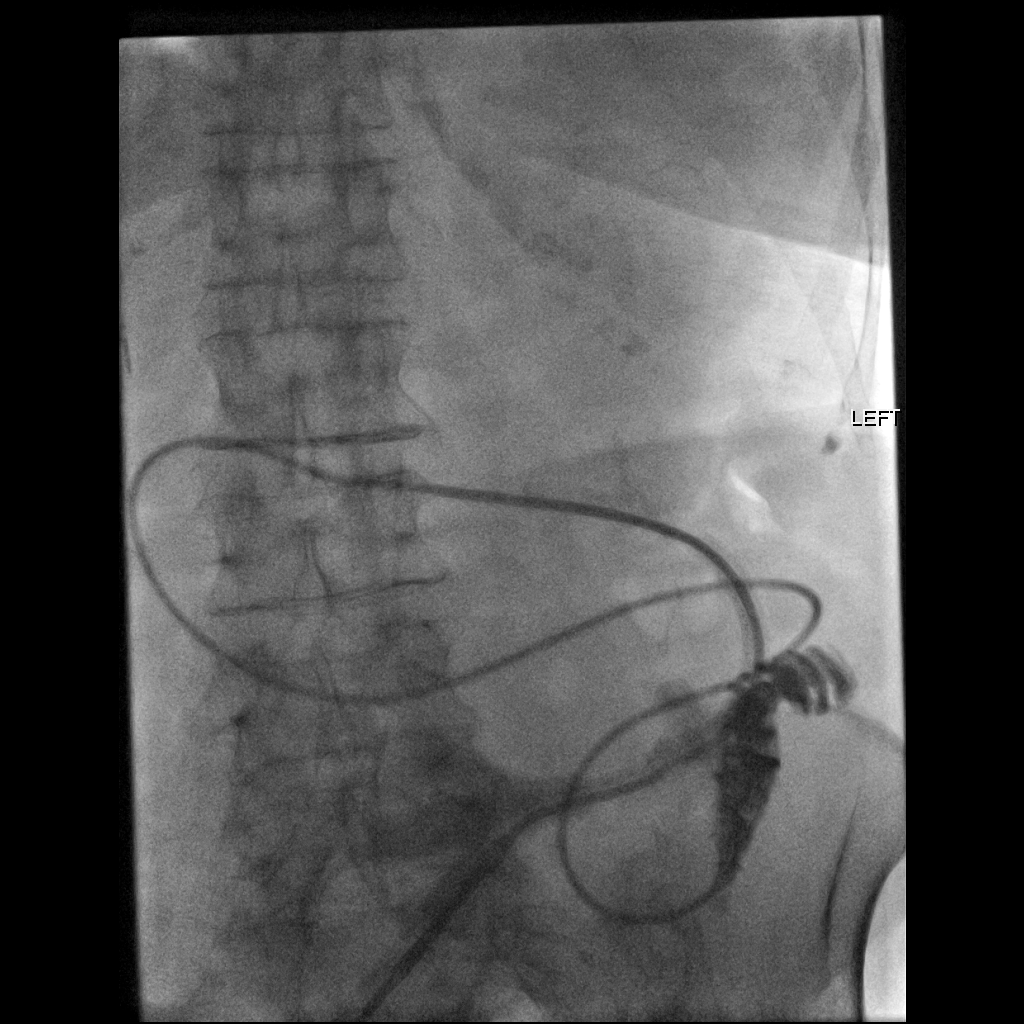
[im 2/2]
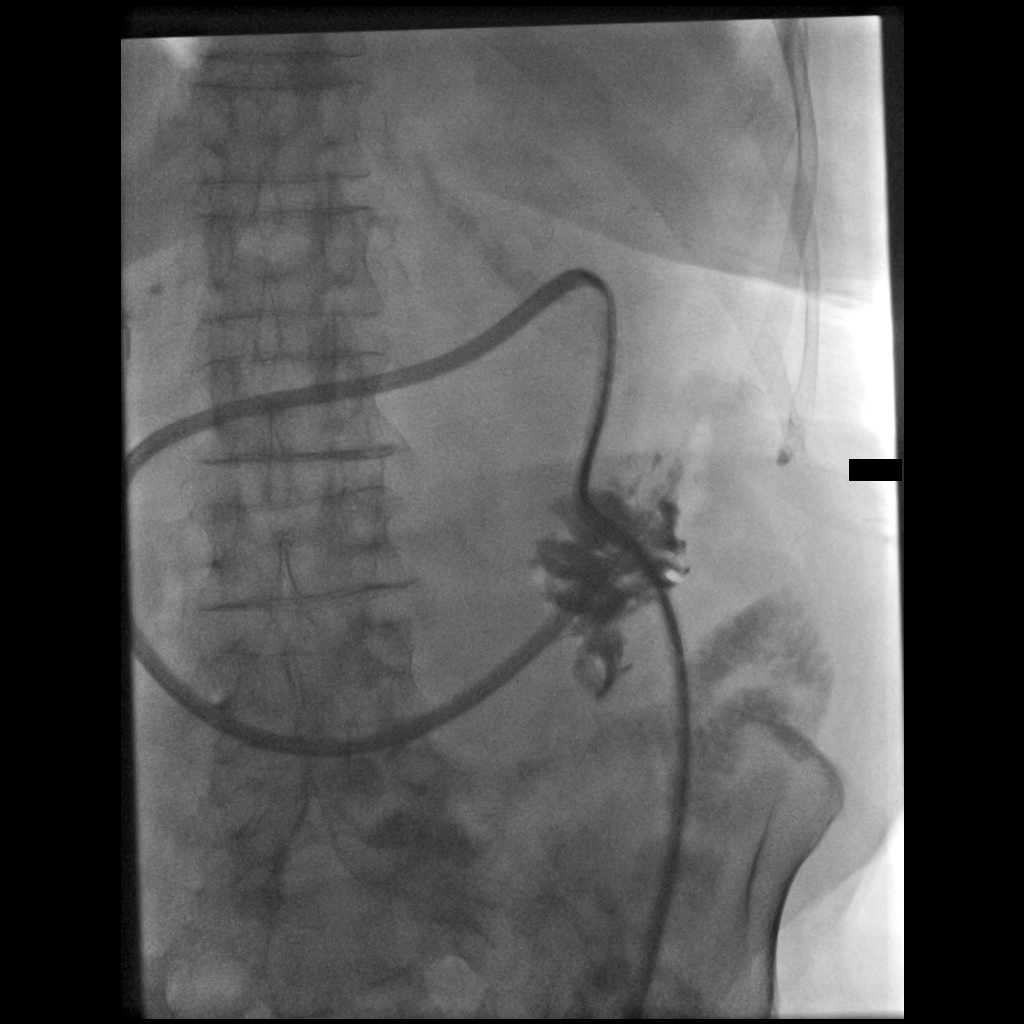

[2 of 2 positions shown; findings below may reference images not displayed]

EXAM:
DUAL-LUMEN GASTROJEJUNOSTOMY CATHETER REPLACEMENT

MEDICATIONS:
no antibiotics indicated

ANESTHESIA/SEDATION:
Viscous lidocaine topically.

CONTRAST:  10mL [id number] IOPAMIDOL (4XU2IT-[id number]) INJECTION 61% -
administered into the bowel lumen.

FLUOROSCOPY TIME:  2 minutes; 436  [id number] DAP

COMPLICATIONS:
None immediate.

PROCEDURE:
Informed written consent was obtained from the patient after a
thorough discussion of the procedural risks, benefits and
alternatives. All questions were addressed. Maximal Sterile Barrier
Technique was utilized including caps, mask, sterile gowns, sterile
gloves, sterile drape, hand hygiene and skin antiseptic. A timeout
was performed prior to the initiation of the procedure.

A small contrast injection demonstrated good position of the
previously placed GJ tube. The catheter was cut and removed over a
stiff hydrophilic guidewire. A new dual-lumen balloon retention
gastrojejunostomy catheter was advanced and positioned with its tip
at the ligament of Treitz. The retention balloon was inflated in the
stomach. Contrast injection confirms good positioning of the jejunal
limb to the ligament of Treitz. The catheter was flushed. Silver
nitrate was applied to some granulation tissue around the catheter
entry site.
IMPRESSION: 1. Technically successful exchange of dual-lumen gastrojejunostomy
catheter under fluoroscopy.

## 2019-10-13 ENCOUNTER — Other Ambulatory Visit: Payer: Self-pay | Admitting: *Deleted

## 2019-10-13 ENCOUNTER — Encounter: Payer: Medicare Other | Admitting: Nurse Practitioner

## 2019-10-13 NOTE — Patient Outreach (Signed)
[facility]) Care Management  10/13/2019  Shannon [name] May 25, 1923 [id number]   Outreach attempt #2, successful.    Call placed to member to follow up on management of her care and recent ED visit for constipation.  She report she is doing well, still having some issues with her G-tube leaking but state it's much better then previously.  Her appetite remains decreased but both she and her daughter feel she is eating enough.  Denies ongoing constipation but report she is having issues with hemorrhoids, has been using sitz bath for comfort.  Will also have her daughter obtain Preparation H and/or Tucks.  Denies any urgent concerns, will follow up within the next month.  Goals Addressed              This Visit's Progress     Patient would like to have relief from hemorrhoids (pt-stated)        CARE PLAN ENTRY (see longitudinal plan of care for additional care plan information)  Current Barriers:   Knowledge Deficits related to treatment for hemorrhoids  Nurse Case Manager Clinical Goal(s):   Over the next 31 days, patient will meet with RN Care Manager to address recovery from hemorrhoids  Over the next 28 days, patient will attend all scheduled medical appointments: with PCP   Over the next 28 days, the patient will demonstrate ongoing self health care management ability as evidenced by ability to stay independent*  Interventions:   Inter-disciplinary care team collaboration (see longitudinal plan of care)  Reviewed medications with patient and discussed use of Tucks versus Preparation H  Discussed plans with patient for ongoing care management follow up and provided patient with direct contact information for care management team  Reviewed scheduled/upcoming provider appointments including:   Patient Self Care Activities:   Attends all scheduled provider appointments  Performs ADL's independently  Unable to perform IADLs independently  Initial goal  documentation         [name], RN, MSN [place] Manager [phone]

## 2019-10-14 NOTE — Telephone Encounter (Signed)
Spoke with [name], patient is doing the sitz baths and they seem to be helping.

## 2019-10-17 ENCOUNTER — Telehealth: Payer: Self-pay | Admitting: Podiatry

## 2019-10-17 NOTE — Telephone Encounter (Signed)
Called patients daughter to get her scheduled for an apppointment per Dr. Amalia Hailey. I was unable to reach patient.

## 2019-10-20 ENCOUNTER — Other Ambulatory Visit: Payer: Self-pay

## 2019-10-20 ENCOUNTER — Ambulatory Visit (INDEPENDENT_AMBULATORY_CARE_PROVIDER_SITE_OTHER): Payer: Medicare Other | Admitting: Adult Health

## 2019-10-20 ENCOUNTER — Ambulatory Visit: Payer: Medicare Other | Admitting: Podiatry

## 2019-10-20 ENCOUNTER — Encounter: Payer: Self-pay | Admitting: Podiatry

## 2019-10-20 ENCOUNTER — Encounter: Payer: Self-pay | Admitting: Adult Health

## 2019-10-20 ENCOUNTER — Ambulatory Visit (INDEPENDENT_AMBULATORY_CARE_PROVIDER_SITE_OTHER): Payer: Medicare Other | Admitting: Podiatry

## 2019-10-20 ENCOUNTER — Ambulatory Visit (INDEPENDENT_AMBULATORY_CARE_PROVIDER_SITE_OTHER): Payer: Medicare Other

## 2019-10-20 VITALS — BP 138/64 | HR 67 | Temp 97.5°F | Ht 66.0 in | Wt 137.4 lb

## 2019-10-20 DIAGNOSIS — I4819 Other persistent atrial fibrillation: Secondary | ICD-10-CM | POA: Diagnosis not present

## 2019-10-20 DIAGNOSIS — I5032 Chronic diastolic (congestive) heart failure: Secondary | ICD-10-CM

## 2019-10-20 DIAGNOSIS — Z9189 Other specified personal risk factors, not elsewhere classified: Secondary | ICD-10-CM | POA: Diagnosis not present

## 2019-10-20 DIAGNOSIS — J449 Chronic obstructive pulmonary disease, unspecified: Secondary | ICD-10-CM | POA: Diagnosis not present

## 2019-10-20 DIAGNOSIS — F331 Major depressive disorder, recurrent, moderate: Secondary | ICD-10-CM

## 2019-10-20 DIAGNOSIS — I10 Essential (primary) hypertension: Secondary | ICD-10-CM | POA: Diagnosis not present

## 2019-10-20 DIAGNOSIS — D638 Anemia in other chronic diseases classified elsewhere: Secondary | ICD-10-CM | POA: Diagnosis not present

## 2019-10-20 DIAGNOSIS — L97512 Non-pressure chronic ulcer of other part of right foot with fat layer exposed: Secondary | ICD-10-CM | POA: Diagnosis not present

## 2019-10-20 DIAGNOSIS — I70235 Atherosclerosis of native arteries of right leg with ulceration of other part of foot: Secondary | ICD-10-CM

## 2019-10-20 DIAGNOSIS — M86671 Other chronic osteomyelitis, right ankle and foot: Secondary | ICD-10-CM | POA: Diagnosis not present

## 2019-10-20 DIAGNOSIS — E0859 Diabetes mellitus due to underlying condition with other circulatory complications: Secondary | ICD-10-CM

## 2019-10-20 DIAGNOSIS — E1142 Type 2 diabetes mellitus with diabetic polyneuropathy: Secondary | ICD-10-CM

## 2019-10-20 DIAGNOSIS — I739 Peripheral vascular disease, unspecified: Secondary | ICD-10-CM

## 2019-10-20 MED ORDER — SERTRALINE HCL 50 MG PO TABS: ORAL_TABLET | ORAL | 3 refills | Status: DC

## 2019-10-20 MED ORDER — FUROSEMIDE 20 MG PO TABS: 20.0000 mg | ORAL_TABLET | Freq: Every day | ORAL | 1 refills | Status: DC

## 2019-10-20 MED ORDER — AMLODIPINE BESYLATE 5 MG PO TABS: 5.0000 mg | ORAL_TABLET | Freq: Every day | ORAL | 11 refills | Status: DC

## 2019-10-20 NOTE — Progress Notes (Signed)
Location:  [facility]  Provider:   Durenda Age - DNP  Code Status:  Full Code  Goals of Care:  Advanced Directives 09/24/2019  Does Patient Have a Medical Advance Directive? No  Type of Advance Directive -  Does patient want to make changes to medical advance directive? -  Copy of [place] in Chart? -  Would patient like information on creating a medical advance directive? -     Chief Complaint  Patient presents with  . Medical Management of Chronic Issues    64-month followup.  . Quality Metric Gaps    Needs A1c. Saw Dr. Amalia Hailey for foot exam today.     HPI: Patient is a 86 y.o. female seen today for medical management of chronic diseases.  She has a PMH of atrial flutter, CAD, CKD, COPD, DVT, GERD, hypertension, hyperlipidemia, peripheral arterial disease, SIADH, small bowel obstruction, CVA and type 2 diabetes mellitus.  Patient is accompanied by daughter. She was wearing right foot boot. Daughter reported that she has wounds on her right foot toes and will be needing transmetatarsal surgery after consultation with podiatrist.  Diabetes mellitus type 2 - daughter stated that her CBG this morning was 106.  Whenever her blood sugar is low she does not take the Lantus 8 units at bedtime.  Daughter lets her mother check her blood sugar and she just ask her what was her blood sugar.  She is due for HgbA1c.  CAD -she denies having chest pains no shortness of breath.  She has a follow-up with cardiology, Dr. Meda Coffee, on 12/09/2019.   Hypothyroidism -  she takes Synthroid 125 mcg daily Lab Results  Component Value Date   TSH 1.428 08/28/2019   COPD with asthma  -no report of wheezing nor SOB, takes Dulera, Flonase and DuoNeb PRN  Neuropathy  -  occasionally she has pain at night.  She takes gabapentin 300 mg twice a day  Hypertension  -BPs has been controlled.  She takes hydralazine 50 mg 3 times a day  Depression -daughter reported that sometimes  complain of itching all over her body but does not have any rashes nor redness.  Daughter thinks that her mother is depressed due to the impending right foot surgery.  She is currently taking Zoloft 50 mg daily   Past Medical History:  Diagnosis Date  . Acute respiratory failure ([place])   . Anemia    previous blood transfusions  . Arthritis    "all over"  . Asthma   . Atrial flutter ([place])   . Bradycardia    requiring previous d/c of BB and reduction of amiodarone  . CAD (coronary artery disease)    nonobstructive per notes  . Chronic diastolic CHF (congestive heart failure) ([place])   . CKD (chronic kidney disease), stage III   . Complication of blood transfusion    "got the wrong blood type at [facility] in ~ 2015; no adverse reaction that we are aware of"/daughter, [name] (01/27/2016)  . COPD (chronic obstructive pulmonary disease) ([place])   . Depression    "light case"  . DVT (deep venous thrombosis) ([place]) 01/2016   a. LLE DVT 01/2016 - switched from Eliquis to Coumadin.  [name] Dyspnea    with some activity  . Gastric stenosis    a. s/p stomach tube  . GERD (gastroesophageal reflux disease)   . History of blood transfusion    "several" (01/27/2016)  . History of stomach ulcers   . Hyperlipidemia   .  Hypertension   . Hypothyroidism   . On home oxygen therapy    "2L; 9PM til 9AM" (06/27/2017)  . PAD (peripheral artery disease) ([place])    a. prior gangrene, toe amputations, intervention.  [name] PAF (paroxysmal atrial fibrillation) ([place])   . Paraesophageal hernia   . Perforated gastric ulcer ([place])   . Pneumonia    "a few times" (06/27/2017)  . Seasonal allergies   . SIADH (syndrome of inappropriate ADH production) ([place])    [name] 01/10/2015  . Small bowel obstruction ([place])    "I don't know how many" (01/11/2015)  . Stroke [facility])    several- no residual  . Type II diabetes mellitus ([place])    "related to prednisone use  for > 20 yr; once predinose stopped; no more DM RX" (01/27/2016)  .  UTI (urinary tract infection) 02/08/2016  . Ventral hernia with bowel obstruction     Past Surgical History:  Procedure Laterality Date  . ABDOMINAL AORTOGRAM N/A 09/21/2016   Procedure: Abdominal Aortogram;  Surgeon: Waynetta Sandy, MD;  Location: [place] [facility];  Service: Cardiovascular;  Laterality: N/A;  . AMPUTATION Left 09/25/2016   Procedure: AMPUTATION DIGIT- LEFT 2ND AND 3RD TOES;  Surgeon: Rosetta Posner, MD;  Location: [place];  Service: Vascular;  Laterality: Left;  . AMPUTATION Right 05/03/2018   Procedure: AMPUTATION RIGHT GREAT TOE;  Surgeon: Rosetta Posner, MD;  Location: [place];  Service: Vascular;  Laterality: Right;  . CATARACT EXTRACTION W/ INTRAOCULAR LENS  IMPLANT, BILATERAL    . CHOLECYSTECTOMY OPEN    . COLECTOMY    . ESOPHAGOGASTRODUODENOSCOPY N/A 01/19/2014   Procedure: ESOPHAGOGASTRODUODENOSCOPY (EGD);  Surgeon: Irene Shipper, MD;  Location[facility];  Service: Endoscopy;  Laterality: N/A;  . ESOPHAGOGASTRODUODENOSCOPY N/A 01/20/2014   Procedure: ESOPHAGOGASTRODUODENOSCOPY (EGD);  Surgeon: Irene Shipper, MD;  Location: [facility];  Service: Endoscopy;  Laterality: N/A;  . ESOPHAGOGASTRODUODENOSCOPY N/A 03/19/2014   Procedure: ESOPHAGOGASTRODUODENOSCOPY (EGD);  Surgeon: Milus Banister, MD;  Location: [facility];  Service: Endoscopy;  Laterality: N/A;  . ESOPHAGOGASTRODUODENOSCOPY N/A 07/08/2015   Procedure: ESOPHAGOGASTRODUODENOSCOPY (EGD);  Surgeon: Doran Stabler, MD;  Location: [facility] ENDOSCOPY;  Service: Endoscopy;  Laterality: N/A;  . ESOPHAGOGASTRODUODENOSCOPY (EGD) WITH PROPOFOL N/A 09/15/2015   Procedure: ESOPHAGOGASTRODUODENOSCOPY (EGD) WITH PROPOFOL;  Surgeon: Manus Gunning, MD;  [facility];  Service: Gastroenterology;  Laterality: N/A;  . GASTROJEJUNOSTOMY     hx/notes 01/10/2015  . GASTROJEJUNOSTOMY N/A 09/23/2015   Procedure: OPEN GASTROJEJUNOSTOMY TUBE PLACEMENT;  Surgeon: Arta Bruce Kinsinger, MD;  Location: [facility];   Service: General;  Laterality: N/A;  . GLAUCOMA SURGERY Bilateral   . HERNIA REPAIR  2015  . IR CM INJ ANY COLONIC TUBE W/FLUORO  01/05/2017  . IR CM INJ ANY COLONIC TUBE W/FLUORO  06/07/2017  . IR CM INJ ANY COLONIC TUBE W/FLUORO  11/05/2017  . IR CM INJ ANY COLONIC TUBE W/FLUORO  09/18/2018  . IR CM INJ ANY COLONIC TUBE W/FLUORO  03/06/2019  . IR CM INJ ANY COLONIC TUBE W/FLUORO  03/11/2019  . IR GENERIC HISTORICAL  01/07/2016   IR GJ TUBE CHANGE 01/07/2016 Jacqulynn Cadet, MD [facility]  . IR GENERIC HISTORICAL  01/27/2016   IR MECH REMOV OBSTRUC MAT ANY COLON TUBE W/FLUORO 01/27/2016 Markus Daft, MD [facility]  . IR GENERIC HISTORICAL  02/07/2016   IR PATIENT EVAL TECH 0-60 MINS Darrell K Allred, PA-C [facility]  . IR GENERIC HISTORICAL  02/08/2016   IR  [place] TUBE CHANGE 02/08/2016 Greggory Keen, MD [facility]  . IR GENERIC HISTORICAL  01/06/2016   IR GJ TUBE CHANGE 01/06/2016 [facility] OUT REF  . IR GENERIC HISTORICAL  05/02/2016   IR CM INJ ANY COLONIC TUBE W/FLUORO 05/02/2016 Arne Cleveland, MD [facility]  . IR GENERIC HISTORICAL  05/15/2016   IR GJ TUBE CHANGE 05/15/2016 Sandi Mariscal, MD [facility]  . IR GENERIC HISTORICAL  06/28/2016   IR GJ TUBE CHANGE 06/28/2016 [facility]  . IR GJ TUBE CHANGE  02/20/2017  . IR GJ TUBE CHANGE  05/10/2017  . IR GJ TUBE CHANGE  07/06/2017  . IR GJ TUBE CHANGE  08/02/2017  . IR GJ TUBE CHANGE  10/15/2017  . IR GJ TUBE CHANGE  12/26/2017  . IR [place] TUBE CHANGE  04/08/2018  . IR [place] TUBE CHANGE  06/19/2018  . IR [place] TUBE CHANGE  03/03/2019  . IR [place] TUBE CHANGE  06/06/2019  . IR GJ TUBE CHANGE  09/26/2019  . IR PATIENT EVAL TECH 0-60 MINS  10/19/2016  . IR PATIENT EVAL TECH 0-60 MINS  12/25/2016  . IR PATIENT EVAL TECH 0-60 MINS  01/29/2017  . IR PATIENT EVAL TECH 0-60 MINS  04/04/2017  . IR PATIENT EVAL TECH 0-60 MINS  04/30/2017  . IR PATIENT EVAL TECH 0-60 MINS  07/31/2017  . IR REPLC DUODEN/JEJUNO TUBE PERCUT W/FLUORO  11/14/2016  . LAPAROTOMY N/A 01/20/2015    Procedure: EXPLORATORY LAPAROTOMY;  Surgeon: Coralie Keens, MD;  Location: [place];  Service: General;  Laterality: N/A;  . LOWER EXTREMITY ANGIOGRAPHY Left 09/21/2016   Procedure: Lower Extremity Angiography;  Surgeon: Waynetta Sandy, MD;  Location: [place] [facility];  Service: Cardiovascular;  Laterality: Left;  . LOWER EXTREMITY ANGIOGRAPHY Right 06/27/2017   Procedure: Lower Extremity Angiography;  Surgeon: Waynetta Sandy, MD;  Location: [place] [facility];  Service: Cardiovascular;  Laterality: Right;  . LYSIS OF ADHESION N/A 01/20/2015   Procedure: LYSIS OF ADHESIONS < 1 HOUR;  Surgeon: Coralie Keens, MD;  Location: [place];  Service: General;  Laterality: N/A;  . PERIPHERAL VASCULAR BALLOON ANGIOPLASTY Left 09/21/2016   Procedure: Peripheral Vascular Balloon Angioplasty;  Surgeon: Waynetta Sandy, MD;  Location: [place] [facility];  Service: Cardiovascular;  Laterality: Left;  drug coated balloon  . PERIPHERAL VASCULAR BALLOON ANGIOPLASTY Right 06/27/2017   Procedure: PERIPHERAL VASCULAR BALLOON ANGIOPLASTY;  Surgeon: Waynetta Sandy, MD;  Location: [place] [facility];  Service: Cardiovascular;  Laterality: Right;  SFA/TPTRUNK  . TONSILLECTOMY    . TUBAL LIGATION    . VENTRAL HERNIA REPAIR  2015   incarcerated ventral hernia ([facility] 09/2013)/notes 01/10/2015    Allergies  Allergen Reactions  . Penicillins Itching, Rash and Other (See Comments)    Tolerated amoxicillin, unasyn, zosyn & cephalosporins in the past. Did it involve swelling of the face/tongue/throat, SOB, or low BP? No Did it involve sudden or severe rash/hives, skin peeling, or any reaction on the inside of your mouth or nose? No Did you need to seek medical attention at a hospital or doctor's office? Unknown When did it last happen?5+ years If all above answers are "NO", may proceed with cephalosporin use.     Outpatient Encounter Medications as of 10/20/2019  Medication Sig    . acetaminophen (TYLENOL) 500 MG tablet Take 500 mg by mouth every 6 (six) hours as needed (pain).   . Amino Acids-Protein Hydrolys (FEEDING SUPPLEMENT, PRO-STAT SUGAR FREE 64,) LIQD Take 30 mLs  by mouth 2 (two) times daily.  [name] amLODipine (NORVASC) 5 MG tablet Take 1 tablet (5 mg total) by mouth daily.  [name] atorvastatin (LIPITOR) 10 MG tablet TAKE 1 TABLET BY MOUTH EVERY DAY  . b complex vitamins tablet Take 1 tablet by mouth daily.   . benzocaine (AMERICAINE) 20 % rectal ointment Place rectally every 3 (three) hours as needed for pain.  . calcium-vitamin D (OSCAL WITH D) 500-200 MG-UNIT per tablet Take 1 tablet by mouth daily with breakfast.   . Carboxymethylcellulose Sodium (ARTIFICIAL TEARS OP) Place 1 drop into both eyes 4 (four) times daily.  . cycloSPORINE (RESTASIS) 0.05 % ophthalmic emulsion USE 1 DROP INTO BOTH EYES TWICE DAILY  . DULERA 200-5 MCG/ACT AERO TAKE TWO PUFFS BY MOUTH TWICE DAILY  . feeding supplement (OSMOLITE 1 CAL) LIQD Take 711 mLs by mouth in the morning and at bedtime.   . ferrous sulfate 220 (44 Fe) MG/5ML solution TAKE 7.4 MLS (325 MG TOTAL) BY MOUTH DAILY.  . fluticasone (FLONASE) 50 MCG/ACT nasal spray SPRAY 2 SPRAYS INTO EACH NOSTRIL EVERY DAY  . furosemide (LASIX) 20 MG tablet Take 20 mg by mouth.  . gabapentin (NEURONTIN) 250 MG/5ML solution TAKE 6 MLS (300 MG TOTAL) BY MOUTH 2 (TWO) TIMES DAILY.  [name] gentamicin cream (GARAMYCIN) 0.1 % Apply 1 application topically daily as needed (dry skin).  . hydrALAZINE (APRESOLINE) 50 MG tablet TAKE 1 TABLET BY MOUTH THREE TIMES A DAY  . insulin aspart (NOVOLOG) 100 UNIT/ML FlexPen Inject 0-6 Units into the skin 3 (three) times daily with meals. New sliding Scale If BS is 151 - 251 = 2 units,  If BS is 251 -  350 = 4 units,  If BS is 351 - 450 = 6 units Call provider if BS runs more than 400  . Insulin Glargine (LANTUS SOLOSTAR) 100 UNIT/ML Solostar Pen Inject 8 Units into the skin at bedtime.  . isosorbide mononitrate (IMDUR)  30 MG 24 hr tablet TAKE 2 TABLETS BY MOUTH DAILY. PLEASE MAKE ANNUAL APPT WITH DR. Meda Coffee FOR FUTURE REFILLS  . latanoprost (XALATAN) 0.005 % ophthalmic solution Place 1 drop into both eyes at bedtime.  [name] levothyroxine (SYNTHROID) 125 MCG tablet TAKE 1 TABLET BY MOUTH EVERY DAY  . loratadine (CLARITIN) 10 MG tablet Take 10 mg by mouth daily as needed for allergies.  . Multiple Vitamin (MULTIVITAMIN WITH MINERALS) TABS tablet Take 1 tablet by mouth daily.  [name] nystatin (MYCOSTATIN/NYSTOP) powder Apply 1 application topically as needed (skin irritation).  [name] oxyCODONE (ROXICODONE) 5 MG immediate release tablet Take 1 tablet (5 mg total) by mouth every 4 (four) hours as needed for severe pain. May take 1/2 tab q4hrs as needed for mild pain.  . pantoprazole (PROTONIX) 40 MG tablet TAKE 1 TABLET BY MOUTH EVERY DAY  . polyethylene glycol (MIRALAX / GLYCOLAX) 17 g packet Take 17 g by mouth daily.  . Probiotic Product (EQL PROBIOTIC COLON SUPPORT PO) Take 1 capsule by mouth daily.  [name] senna-docusate (SENOKOT-S) 8.6-50 MG tablet Take 1 tablet by mouth at bedtime as needed for mild constipation or moderate constipation.  . sertraline (ZOLOFT) 50 MG tablet Take 50 mg by mouth daily.  [name] warfarin (COUMADIN) 2.5 MG tablet Take 2.5 mg by mouth daily.  [name] warfarin (COUMADIN) 2.5 MG tablet Take 2.5 mg by mouth daily. Takes 3 tablets on Sun and Thursday, takes 2 tabs all other days.  . Insulin Pen Needle (B-D UF III MINI PEN NEEDLES) 31G X 5  MM MISC Use twice daily with giving insulin injections. Dx E11.9  . Water For Irrigation, Sterile (FREE WATER) SOLN Place 180 mLs into feeding tube 3 (three) times daily.   Dema Severin Petrolatum-Mineral Oil (SYSTANE NIGHTTIME) OINT Place 1 application into both eyes at bedtime.   . [DISCONTINUED] furosemide (LASIX) 8 MG/ML solution 20 mg (2.5 ml) daily unless swelling occurs take 40 mg (5 ml) and then switch back to 20 mg daily (Patient not taking: Reported on 09/24/2019)  .  [DISCONTINUED] ipratropium-albuterol (DUONEB) 0.5-2.5 (3) MG/3ML SOLN Take 3 mLs by nebulization every 6 (six) hours as needed (sob). Use 3 times daily x 4 days then every 6 hours as needed. (Patient not taking: Reported on 09/24/2019)  . [DISCONTINUED] levalbuterol (XOPENEX HFA) 45 MCG/ACT inhaler Inhale 2 puffs into the lungs every 6 (six) hours as needed for wheezing or shortness of breath. (Patient not taking: Reported on 09/24/2019)  . [DISCONTINUED] sertraline (ZOLOFT) 20 MG/ML concentrated solution Take 2.5 mLs (50 mg total) by mouth daily.  . [DISCONTINUED] warfarin (COUMADIN) 2.5 MG tablet TAKE 2-3 TABLETS BY MOUTH DAILY AS DIRECTED BY COUMADIN CLINIC. (Patient taking differently: Take 3-7.5 mg by mouth See admin instructions. Takes 3 tablets by mouth on Sunday and Thursday and 2 tablets on all other days)   No facility-administered encounter medications on file as of 10/20/2019.    Review of Systems:  Review of Systems  Constitutional: Negative for activity change and appetite change.  HENT: Negative for congestion, ear pain and facial swelling.   Eyes: Negative.  Negative for discharge.  Respiratory: Negative for cough, shortness of breath and wheezing.   Cardiovascular: Negative for leg swelling.  Gastrointestinal: Negative for abdominal distention, constipation and nausea.       Incontinent  Endocrine: Negative for polydipsia, polyphagia and polyuria.  Genitourinary: Negative for difficulty urinating and vaginal discharge.       Incontinent  Skin: Negative for rash.       Wearing right foot boot, had just come from podiatrist.  Neurological: Negative for dizziness and headaches.  Psychiatric/Behavioral: Negative for behavioral problems and self-injury.       Daughter reported that she would sometimes complain of itching but there are no rashes. Daughter thinks patient is depressed.    Health Maintenance  Topic Date Due  . HEMOGLOBIN A1C  08/30/2019  . INFLUENZA VACCINE   11/02/2019  . OPHTHALMOLOGY EXAM  12/16/2019  . FOOT EXAM  10/19/2020  . TETANUS/TDAP  07/19/2026  . DEXA SCAN  Completed  . COVID-19 Vaccine  Completed  . PNA vac Low Risk Adult  Completed    Physical Exam: Vitals:   10/20/19 1449  BP: 138/64  Pulse: 67  Temp: (!) 97.5 F (36.4 C)  TempSrc: Temporal  SpO2: 98%  Weight: 137 lb 6.4 oz (62.3 kg)  Height: 5\' 6"  (1.676 m)   Body mass index is 22.18 kg/m. Physical Exam Constitutional:      Appearance: Normal appearance.  HENT:     Head: Normocephalic and atraumatic.     Nose: Nose normal.  Eyes:     Conjunctiva/sclera: Conjunctivae normal.  Cardiovascular:     Rate and Rhythm: Normal rate and regular rhythm.  Pulmonary:     Effort: Pulmonary effort is normal.     Breath sounds: Normal breath sounds. No wheezing.  Abdominal:     General: Bowel sounds are normal. There is no distension.     Palpations: Abdomen is soft.     Comments: Has tube  for feeding  Musculoskeletal:        General: Normal range of motion.     Cervical back: Normal range of motion and neck supple.  Skin:    General: Skin is dry.     Comments: Right foot with dressing and boot  Neurological:     General: No focal deficit present.     Mental Status: She is alert. Mental status is at baseline.  Psychiatric:        Mood and Affect: Mood normal.        Behavior: Behavior normal.        Thought Content: Thought content normal.        Judgment: Judgment normal.     Labs reviewed: Basic Metabolic Panel: Recent Labs    05/18/19 1925 05/18/19 2355 07/07/19 0123 07/08/19 0539 08/27/19 1239 08/27/19 2016 08/28/19 0101 09/11/19 1424 09/24/19 2000  NA 134*   < >  --    < >   < >  --  137 137 136  K 4.3   < >  --    < >   < >  --  4.6 4.5 4.6  CL 101   < >  --    < >   < >  --  103 102 103  CO2 24   < >  --    < >   < >  --  23 26 23   GLUCOSE 80   < >  --    < >   < >  --  93 97 98  BUN 60*   < >  --    < >   < >  --  44* 61* 49*  CREATININE  1.75*   < >  --    < >   < >  --  1.34* 1.54* 1.33*  CALCIUM 9.4   < >  --    < >   < >  --  9.7 9.7 9.0  MG 2.2  --   --   --   --  2.3  --   --   --   TSH  --   --  1.623  --   --   --  1.428  --   --    < > = values in this interval not displayed.   Liver Function Tests: Recent Labs    07/07/19 0122 08/15/19 0519 09/24/19 2000  AST 30 25 21   ALT 21 19 17   ALKPHOS 91 70 75  BILITOT 0.5 0.1* 0.7  PROT 8.1 7.0 7.6  ALBUMIN 4.0 3.4* 3.4*   Recent Labs    07/07/19 0122 08/15/19 0519 09/24/19 2000  LIPASE 26 30 24    No results for input(s): AMMONIA in the last 8760 hours. CBC: Recent Labs    08/15/19 0519 08/27/19 1239 08/28/19 0101 09/11/19 1424 09/24/19 2000  WBC 4.3   < > 4.7 5.8 6.7  NEUTROABS 2.3  --   --  4,176 4.3  HGB 8.8*   < > 9.5* 9.6* 9.1*  HCT 26.1*   < > 28.4* 29.8* 26.8*  MCV 94.9   < > 91.6 93.4 91.5  PLT 190   < > 264 306 240   < > = values in this interval not displayed.   Lipid Panel: No results for input(s): CHOL, HDL, LDLCALC, TRIG, CHOLHDL, LDLDIRECT in the last 8760 hours. Lab Results  Component Value Date   HGBA1C 6.2 (H)  03/02/2019    Procedures since last visit: CT ABDOMEN PELVIS W CONTRAST  Result Date: 09/24/2019 CLINICAL DATA:  Constipation and hernia EXAM: CT ABDOMEN AND PELVIS WITH CONTRAST TECHNIQUE: Multidetector CT imaging of the abdomen and pelvis was performed using the standard protocol following bolus administration of intravenous contrast. CONTRAST:  60mL OMNIPAQUE IOHEXOL 300 MG/ML  SOLN COMPARISON:  07/07/2019 FINDINGS: LOWER CHEST: Normal. HEPATOBILIARY: Scattered hypodense foci, too small to characterize accurately, but most likely hepatic cysts. Otherwise normal liver. No biliary dilatation. Status post cholecystectomy. PANCREAS: Normal pancreas. No ductal dilatation or peripancreatic fluid collection. SPLEEN: Normal. ADRENALS/URINARY TRACT: The adrenal glands are normal. No hydronephrosis, nephroureterolithiasis or solid  renal mass. The urinary bladder is normal for degree of distention STOMACH/BOWEL: There is a percutaneous jejunostomy tube with tip in the proximal jejunum. There is a large ventral abdominal hernia that contains a segment of the transverse colon. No small bowel dilatation or inflammation. The appendix is not visualized. No right lower quadrant inflammation or free fluid. VASCULAR/LYMPHATIC: There is calcific atherosclerosis of the abdominal aorta. No abdominal or pelvic lymphadenopathy. REPRODUCTIVE: Normal uterus.  No adnexal mass. MUSCULOSKELETAL. No bony spinal canal stenosis or focal osseous abnormality. OTHER: None. IMPRESSION: 1. Large ventral abdominal hernia that contains a segment of the transverse colon. No evidence of obstruction or inflammation. 2. Percutaneous jejunostomy tube with tip in the proximal jejunum. 3. Aortic Atherosclerosis (ICD10-I70.0). Electronically Signed   By: Ulyses Jarred M.D.   On: 09/24/2019 21:27   IR GJ Tube Change  Result Date: 09/26/2019 INDICATION: Inadvertent removal of chronic gastrojejunostomy tube. Please perform fluoroscopic guided exchange. EXAM: FLUOROSCOPIC GUIDED REPLACEMENT OF GASTROJEJUNOSTOMY TUBE COMPARISON:  Fluoroscopic guided exchange gastrojejunostomy tube-06/06/2019; CT abdomen and pelvis-09/24/2019 MEDICATIONS: None. CONTRAST:  20 cc Omnipaque 300 FLUOROSCOPY TIME:  48 seconds COMPLICATIONS: None. PROCEDURE: Informed written consent was obtained from the patient after a discussion of the risks and benefits. The upper abdomen and the external portion of the existing retracted gastrojejunostomy tube was prepped and draped in the usual sterile fashion, and a sterile drape was applied covering the operative field. Maximum barrier sterile technique with sterile gowns and gloves were used for the procedure. A timeout was performed prior to the initiation of the procedure. The external portion of the gastrojejunostomy tube was cut and a 4 French angled glide  catheter was advanced through the jejunal lumen. Contrast injection demonstrated positioning within the gastric lumen. Next, with the use of a stiff Glidewire, the 4 French angled glide catheter was advanced to the level of the proximal jejunum. Contrast injection confirmed appropriate positioning. Next, under intermittent fluoroscopic guidance, the 4 French angled glide catheter and remaining portion of the gastrojejunostomy catheter were exchanged for a new 53 French gastrojejunostomy catheter with tip ultimately terminating within the proximal jejunum. The retention balloon was insufflated with approximately 10 cc of saline and dilute contrast and pulled against the anterior wall the gastric lumen. The external disc was cinched. Contrast injection demonstrated appropriate positioning and functionality of both the gastric and jejunal lumens. A dressing was applied. The patient tolerated procedure well without immediate postprocedural complication. IMPRESSION: Successful fluoroscopic guided replacement of 31 French balloon retention gastrojejunostomy tube with tip ultimately terminating within the proximal jejunum. Electronically Signed   By: Sandi Mariscal M.D.   On: 09/26/2019 16:47   DG Abdomen Acute W/Chest  Result Date: 09/24/2019 CLINICAL DATA:  Constipation.  Pain around gastrostomy tube site. EXAM: DG ABDOMEN ACUTE W/ 1V CHEST COMPARISON:  05/18/2019 FINDINGS: A gastrojejunostomy tube  is identified within the abdomen. This appears similar in position to the previous exam. No dilated loops of bowel identified. Cholecystectomy clips noted. Asymmetric elevation of right hemidiaphragm. IMPRESSION: Nonobstructive bowel gas pattern. Unchanged position of gastrojejunostomy tube. Electronically Signed   By: Kerby Moors M.D.   On: 09/24/2019 20:00    Assessment/Plan  1. Type 2 diabetes mellitus with peripheral neuropathy (HCC) Lab Results  Component Value Date   HGBA1C 6.2 (H) 03/02/2019   -Continue  NovoLog sliding scale TID and Lantus -  discussed importance of regular CBG checks and to keep a log -Continue gabapentin and PRN oxycodone  2. Persistent atrial fibrillation (HCC) -Rate controlled, continue Coumadin for anticoagulation -Last INR done on 10/08/2019 was 1.9  3. Chronic diastolic CHF (congestive heart failure) (HCC) -No SOB, continue furosemide  4. Anemia of chronic disease Lab Results  Component Value Date   HGB 9.1 (L) 09/24/2019   -Stable, continue ferrous sulfate  5. COPD with asthma ([place]) -  No reported SOB nor wheezing, continue Dulera, Flonase and PRN DuoNeb  6. Essential hypertension, benign -Stable, continue hydralazine and amlodipine  7. Moderate episode of recurrent major depressive disorder ([place]) -Zoloft will be increased from 50 mg daily to 100 mg daily  8. At risk for aspiration related to tube feeding -Continues to have tube feeding with Osmolite at night from 9 PM to 9AM -Aspiration precautions   Labs/tests ordered:  hgbA1c   Next appt:  10/21/2019  Demetrion Wesby Medina-Vargas - DNP, MSN. FNP-BC [phone]  -  Monday to Friday 8AM to [place]  -  After hours [facility]

## 2019-10-20 NOTE — Patient Instructions (Signed)
Neuropathic Pain Neuropathic pain is pain caused by damage to the nerves that are responsible for certain sensations in your body (sensory nerves). The pain can be caused by:  Damage to the sensory nerves that send signals to your spinal cord and brain (peripheral nervous system).  Damage to the sensory nerves in your brain or spinal cord (central nervous system). Neuropathic pain can make you more sensitive to pain. Even a minor sensation can feel very painful. This is usually a long-term condition that can be difficult to treat. The type of pain differs from person to person. It may:  Start suddenly (acute), or it may develop slowly and last for a long time (chronic).  Come and go as damaged nerves heal, or it may stay at the same level for years.  Cause emotional distress, loss of sleep, and a lower quality of life. What are the causes? The most common cause of this condition is diabetes. Many other diseases and conditions can also cause neuropathic pain. Causes of neuropathic pain can be classified as:  Toxic. This is caused by medicines and chemicals. The most common cause of toxic neuropathic pain is damage from cancer treatments (chemotherapy).  Metabolic. This can be caused by: ? Diabetes. This is the most common disease that damages the nerves. ? Lack of vitamin B from long-term alcohol abuse.  Traumatic. Any injury that cuts, crushes, or stretches a nerve can cause damage and pain. A common example is feeling pain after losing an arm or leg (phantom limb pain).  Compression-related. If a sensory nerve gets trapped or compressed for a long period of time, the blood supply to the nerve can be cut off.  Vascular. Many blood vessel diseases can cause neuropathic pain by decreasing blood supply and oxygen to nerves.  Autoimmune. This type of pain results from diseases in which the body's defense system (immune system) mistakenly attacks sensory nerves. Examples of autoimmune  diseases that can cause neuropathic pain include lupus and multiple sclerosis.  Infectious. Many types of viral infections can damage sensory nerves and cause pain. Shingles infection is a common cause of this type of pain.  Inherited. Neuropathic pain can be a symptom of many diseases that are passed down through families (genetic). What increases the risk? You are more likely to develop this condition if:  You have diabetes.  You smoke.  You drink too much alcohol.  You are taking certain medicines, including medicines that kill cancer cells (chemotherapy) or that treat immune system disorders. What are the signs or symptoms? The main symptom is pain. Neuropathic pain is often described as:  Burning.  Shock-like.  Stinging.  Hot or cold.  Itching. How is this diagnosed? No single test can diagnose neuropathic pain. It is diagnosed based on:  Physical exam and your symptoms. Your health care provider will ask you about your pain. You may be asked to use a pain scale to describe how bad your pain is.  Tests. These may be done to see if you have a high sensitivity to pain and to help find the cause and location of any sensory nerve damage. They include: ? Nerve conduction studies to test how well nerve signals travel through your sensory nerves (electrodiagnostic testing). ? Stimulating your sensory nerves through electrodes on your skin and measuring the response in your spinal cord and brain (somatosensory evoked potential).  Imaging studies, such as: ? X-rays. ? CT scan. ? MRI. How is this treated? Treatment for neuropathic pain may  change over time. You may need to try different treatment options or a combination of treatments. Some options include:  Treating the underlying cause of the neuropathy, such as diabetes, kidney disease, or vitamin deficiencies.  Stopping medicines that can cause neuropathy, such as chemotherapy.  Medicine to relieve pain. Medicines may  include: ? Prescription or over-the-counter pain medicine. ? Anti-seizure medicine. ? Antidepressant medicines. ? Pain-relieving patches that are applied to painful areas of skin. ? A medicine to numb the area (local anesthetic), which can be injected as a nerve block.  Transcutaneous nerve stimulation. This uses electrical currents to block painful nerve signals. The treatment is painless.  Alternative treatments, such as: ? Acupuncture. ? Meditation. ? Massage. ? Physical therapy. ? Pain management programs. ? Counseling. Follow these instructions at home: Medicines   Take over-the-counter and prescription medicines only as told by your health care provider.  Do not drive or use heavy machinery while taking prescription pain medicine.  If you are taking prescription pain medicine, take actions to prevent or treat constipation. Your health care provider may recommend that you: ? Drink enough fluid to keep your urine pale yellow. ? Eat foods that are high in fiber, such as fresh fruits and vegetables, whole grains, and beans. ? Limit foods that are high in fat and processed sugars, such as fried or sweet foods. ? Take an over-the-counter or prescription medicine for constipation. Lifestyle   Have a good support system at home.  Consider joining a chronic pain support group.  Do not use any products that contain nicotine or tobacco, such as cigarettes and e-cigarettes. If you need help quitting, ask your health care provider.  Do not drink alcohol. General instructions  Learn as much as you can about your condition.  Work closely with all your health care providers to find the treatment plan that works best for you.  Ask your health care provider what activities are safe for you.  Keep all follow-up visits as told by your health care provider. This is important. Contact a health care provider if:  Your pain treatments are not working.  You are having side effects  from your medicines.  You are struggling with tiredness (fatigue), mood changes, depression, or anxiety. Summary  Neuropathic pain is pain caused by damage to the nerves that are responsible for certain sensations in your body (sensory nerves).  Neuropathic pain may come and go as damaged nerves heal, or it may stay at the same level for years.  Neuropathic pain is usually a long-term condition that can be difficult to treat. Consider joining a chronic pain support group. This information is not intended to replace advice given to you by your health care provider. Make sure you discuss any questions you have with your health care provider. Document Revised: 07/11/2018 Document Reviewed: 04/06/2017 Elsevier Patient Education  2020 [place] With Depression Everyone experiences occasional disappointment, sadness, and loss in their lives. When you are feeling down, blue, or sad for at least 2 weeks in a row, it may mean that you have depression. Depression can affect your thoughts and feelings, relationships, daily activities, and physical health. It is caused by changes in the way your brain functions. If you receive a diagnosis of depression, your health care provider will tell you which type of depression you have and what treatment options are available to you. If you are living with depression, there are ways to help you recover from it and also ways to prevent it  from coming back. How to cope with lifestyle changes Coping with stress     Stress is your bodys reaction to life changes and events, both good and bad. Stressful situations may include:  Getting married.  The death of a spouse.  Losing a job.  Retiring.  Having a baby. Stress can last just a few hours or it can be ongoing. Stress can play a major role in depression, so it is important to learn both how to cope with stress and how to think about it differently. Talk with your health care provider or a  counselor if you would like to learn more about stress reduction. He or she may suggest some stress reduction techniques, such as:  Music therapy. This can include creating music or listening to music. Choose music that you enjoy and that inspires you.  Mindfulness-based meditation. This kind of meditation can be done while sitting or walking. It involves being aware of your normal breaths, rather than trying to control your breathing.  Centering prayer. This is a kind of meditation that involves focusing on a spiritual word or phrase. Choose a word, phrase, or sacred image that is meaningful to you and that brings you peace.  Deep breathing. To do this, expand your stomach and inhale slowly through your nose. Hold your breath for 3-5 seconds, then exhale slowly, allowing your stomach muscles to relax.  Muscle relaxation. This involves intentionally tensing muscles then relaxing them. Choose a stress reduction technique that fits your lifestyle and personality. Stress reduction techniques take time and practice to develop. Set aside 5-15 minutes a day to do them. Therapists can offer training in these techniques. The training may be covered by some insurance plans. Other things you can do to manage stress include:  Keeping a stress diary. This can help you learn what triggers your stress and ways to control your response.  Understanding what your limits are and saying no to requests or events that lead to a schedule that is too full.  Thinking about how you respond to certain situations. You may not be able to control everything, but you can control how you react.  Adding humor to your life by watching funny films or TV shows.  Making time for activities that help you relax and not feeling guilty about spending your time this way.  Medicines Your health care provider may suggest certain medicines if he or she feels that they will help improve your condition. Avoid using alcohol and other  substances that may prevent your medicines from working properly (may interact). It is also important to:  Talk with your pharmacist or health care provider about all the medicines that you take, their possible side effects, and what medicines are safe to take together.  Make it your goal to take part in all treatment decisions (shared decision-making). This includes giving input on the side effects of medicines. It is best if shared decision-making with your health care provider is part of your total treatment plan. If your health care provider prescribes a medicine, you may not notice the full benefits of it for 4-8 weeks. Most people who are treated for depression need to be on medicine for at least 6-12 months after they feel better. If you are taking medicines as part of your treatment, do not stop taking medicines without first talking to your health care provider. You may need to have the medicine slowly decreased (tapered) over time to decrease the risk of harmful side effects. Relationships  Your health care provider may suggest family therapy along with individual therapy and drug therapy. While there may not be family problems that are causing you to feel depressed, it is still important to make sure your family learns as much as they can about your mental health. Having your familys support can help make your treatment successful. How to recognize changes in your condition Everyone has a different response to treatment for depression. Recovery from major depression happens when you have not had signs of major depression for two months. This may mean that you will start to:  Have more interest in doing activities.  Feel less hopeless than you did 2 months ago.  Have more energy.  Overeat less often, or have better or improving appetite.  Have better concentration. Your health care provider will work with you to decide the next steps in your recovery. It is also important to recognize  when your condition is getting worse. Watch for these signs:  Having fatigue or low energy.  Eating too much or too little.  Sleeping too much or too little.  Feeling restless, agitated, or hopeless.  Having trouble concentrating or making decisions.  Having unexplained physical complaints.  Feeling irritable, angry, or aggressive. Get help as soon as you or your family members notice these symptoms coming back. How to get support and help from others How to talk with friends and family members about your condition  Talking to friends and family members about your condition can provide you with one way to get support and guidance. Reach out to trusted friends or family members, explain your symptoms to them, and let them know that you are working with a health care provider to treat your depression. Financial resources Not all insurance plans cover mental health care, so it is important to check with your insurance carrier. If paying for co-pays or counseling services is a problem, search for a local or county mental health care center. They may be able to offer public mental health care services at low or no cost when you are not able to see a private health care provider. If you are taking medicine for depression, you may be able to get the generic form, which may be less expensive. Some makers of prescription medicines also offer help to patients who cannot afford the medicines they need. Follow these instructions at home:   Get the right amount and quality of sleep.  Cut down on using caffeine, tobacco, alcohol, and other potentially harmful substances.  Try to exercise, such as walking or lifting small weights.  Take over-the-counter and prescription medicines only as told by your health care provider.  Eat a healthy diet that includes plenty of vegetables, fruits, whole grains, low-fat dairy products, and lean protein. Do not eat a lot of foods that are high in solid fats,  added sugars, or salt.  Keep all follow-up visits as told by your health care provider. This is important. Contact a health care provider if:  You stop taking your antidepressant medicines, and you have any of these symptoms: ? Nausea. ? Headache. ? Feeling lightheaded. ? Chills and body aches. ? Not being able to sleep (insomnia).  You or your friends and family think your depression is getting worse. Get help right away if:  You have thoughts of hurting yourself or others. If you ever feel like you may hurt yourself or others, or have thoughts about taking your own life, get help right away. You can go to  your nearest emergency department or call:  Your local emergency services (911 in the [place]  A suicide crisis helpline, such as the [place] at (803) 529-5423. This is open 24-hours a day. Summary  If you are living with depression, there are ways to help you recover from it and also ways to prevent it from coming back.  Work with your health care team to create a management plan that includes counseling, stress management techniques, and healthy lifestyle habits. This information is not intended to replace advice given to you by your health care provider. Make sure you discuss any questions you have with your health care provider. Document Revised: 07/12/2018 Document Reviewed: 02/21/2016 Elsevier Patient Education  [place].

## 2019-10-21 ENCOUNTER — Ambulatory Visit (INDEPENDENT_AMBULATORY_CARE_PROVIDER_SITE_OTHER): Payer: Medicare Other | Admitting: Nurse Practitioner

## 2019-10-21 ENCOUNTER — Telehealth: Payer: Self-pay

## 2019-10-21 ENCOUNTER — Encounter: Payer: Self-pay | Admitting: Nurse Practitioner

## 2019-10-21 DIAGNOSIS — Z0000 Encounter for general adult medical examination without abnormal findings: Secondary | ICD-10-CM | POA: Diagnosis not present

## 2019-10-21 MED ORDER — DOXYCYCLINE HYCLATE 100 MG PO TABS
100.0000 mg | ORAL_TABLET | Freq: Two times a day (BID) | ORAL | 0 refills | Status: DC
Start: 2019-10-21 — End: 2019-11-07

## 2019-10-21 NOTE — Telephone Encounter (Signed)
Ms. amarise, lillo are scheduled for a virtual visit with your provider today.    Just as we do with appointments in the office, we must obtain your consent to participate.  Your consent will be active for this visit and any virtual visit you may have with one of our providers in the next 365 days.    If you have a MyChart account, I can also send a copy of this consent to you electronically.  All virtual visits are billed to your insurance company just like a traditional visit in the office.  As this is a virtual visit, video technology does not allow for your provider to perform a traditional examination.  This may limit your provider's ability to fully assess your condition.  If your provider identifies any concerns that need to be evaluated in person or the need to arrange testing such as labs, EKG, etc, we will make arrangements to do so.    Although advances in technology are sophisticated, we cannot ensure that it will always work on either your end or our end.  If the connection with a video visit is poor, we may have to switch to a telephone visit.  With either a video or telephone visit, we are not always able to ensure that we have a secure connection.   I need to obtain your verbal consent now.   Are you willing to proceed with your visit today?   JAZMINN POMALES has provided verbal consent on 10/21/2019 for a virtual visit (video or telephone).   Carroll Kinds, CMA 10/21/2019  11:11 AM

## 2019-10-21 NOTE — H&P (View-Only) (Signed)
Subjective:  86 y.o. female with PMHx of diabetes mellitus presenting today for follow up evaluation of chronic ulcerations of the right foot.  Patient presents with her daughter today because of most recently she has noticed an increase in pain with swelling of the second toe of the right foot.  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  She has noticed increased aches with swelling associated to the foot.  Just recently she noticed some pus draining from the distal ulceration site.  She presents for further treatment and evaluation  Past Medical History:  Diagnosis Date  . Acute respiratory failure ([place])   . Anemia    previous blood transfusions  . Arthritis    "all over"  . Asthma   . Atrial flutter ([place])   . Bradycardia    requiring previous d/c of BB and reduction of amiodarone  . CAD (coronary artery disease)    nonobstructive per notes  . Chronic diastolic CHF (congestive heart failure) ([place])   . CKD (chronic kidney disease), stage III   . Complication of blood transfusion    "got the wrong blood type at [facility] in ~ 2015; no adverse reaction that we are aware of"/daughter, [name] (01/27/2016)  . COPD (chronic obstructive pulmonary disease) ([place])   . Depression    "light case"  . DVT (deep venous thrombosis) ([place]) 01/2016   a. LLE DVT 01/2016 - switched from Eliquis to Coumadin.  [name] Dyspnea    with some activity  . Gastric stenosis    a. s/p stomach tube  . GERD (gastroesophageal reflux disease)   . History of blood transfusion    "several" (01/27/2016)  . History of stomach ulcers   . Hyperlipidemia   . Hypertension   . Hypothyroidism   . On home oxygen therapy    "2L; 9PM til 9AM" (06/27/2017)  . PAD (peripheral artery disease) ([place])    a. prior gangrene, toe amputations, intervention.  [name] PAF (paroxysmal atrial fibrillation) ([place])   . Paraesophageal hernia   . Perforated gastric ulcer ([place])   . Pneumonia    "a few times"  (06/27/2017)  . Seasonal allergies   . SIADH (syndrome of inappropriate ADH production) ([place])    [name] 01/10/2015  . Small bowel obstruction ([place])    "I don't know how many" (01/11/2015)  . Stroke [facility])    several- no residual  . Type II diabetes mellitus ([place])    "related to prednisone use  for > 20 yr; once predinose stopped; no more DM RX" (01/27/2016)  . UTI (urinary tract infection) 02/08/2016  . Ventral hernia with bowel obstruction       Objective/Physical Exam General: The patient is alert and oriented x3 in no acute distress.  Dermatology:  Wounds noted to 2nd and 3rd and 4th digits of the right foot, both measuring 0.5 x 0.5 x 0.1 cm (LxWxD).  Today the right second toe appears acutely inflamed and swollen likely secondary to the chronic osteomyelitis within the toe with a acute episode.  Purulent drainage noted from the toe.  There is no malodor.  The ulceration to the distal tip of the toe does probe to bone.  Vascular: Diminished pedal pulses bilaterally.  Edema noted to the right second toe. Capillary refill within normal limits.  Neurological: Epicritic and protective threshold diminished bilaterally.   Musculoskeletal Exam: History of partial first ray amputation right foot.  History of partial toe amputations right foot.  Assessment:  1. Ulcerations noted to digits 2 and 3 and 4 secondary to diabetes mellitus 2. diabetes mellitus w/ peripheral neuropathy   Plan of Care:  1. Patient was evaluated. 2.  Today I explained to the daughter and the patient that I do believe it would be in her best interest at this time to have surgical amputation, not only of the second toe, but of a transmetatarsal amputation of the right foot.  The patient already has a history of partial first ray amputation secondary to acute infection with osteomyelitis.  We have been trying to avoid this given the patient's age however at this point I believe it is warranted and necessary.  All possible  complications and details the procedure were explained.  No guarantees were expressed or implied. 3.  Today authorization for surgery was initiated today.  Surgery will consist of transmetatarsal amputation right foot.  Prior to surgery we will need vascular and medical clearance from her PCP. 4.  Surgery will be performed outpatient at one of the colon facilities, possibly [facility] or [facility] 5.  Cultures were taken and sent to pathology 6.  Prescription for doxycycline 100 mg #22 times daily x10 days 7.  Continue applying Betadine daily to the toe 8.  I did explain that if the swelling increases and extends proximally up the foot recommend going immediately to the emergency department for admission and surgery would likely be performed inpatient. 9.  Return to clinic 1 week postop  Edrick Kins, DPM [facility]  Dr. Edrick Kins, [address]                                        [place], [place] [address]                Office [phone]  Fax [phone]

## 2019-10-21 NOTE — Progress Notes (Signed)
Subjective:  86 y.o. female with PMHx of diabetes mellitus presenting today for follow up evaluation of chronic ulcerations of the right foot.  Patient presents with her daughter today because of most recently she has noticed an increase in pain with swelling of the second toe of the right foot.  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  She has noticed increased aches with swelling associated to the foot.  Just recently she noticed some pus draining from the distal ulceration site.  She presents for further treatment and evaluation  Past Medical History:  Diagnosis Date  . Acute respiratory failure ([place])   . Anemia    previous blood transfusions  . Arthritis    "all over"  . Asthma   . Atrial flutter ([place])   . Bradycardia    requiring previous d/c of BB and reduction of amiodarone  . CAD (coronary artery disease)    nonobstructive per notes  . Chronic diastolic CHF (congestive heart failure) ([place])   . CKD (chronic kidney disease), stage III   . Complication of blood transfusion    "got the wrong blood type at [facility] in ~ 2015; no adverse reaction that we are aware of"/daughter, [name] (01/27/2016)  . COPD (chronic obstructive pulmonary disease) ([place])   . Depression    "light case"  . DVT (deep venous thrombosis) ([place]) 01/2016   a. LLE DVT 01/2016 - switched from Eliquis to Coumadin.  [name] Dyspnea    with some activity  . Gastric stenosis    a. s/p stomach tube  . GERD (gastroesophageal reflux disease)   . History of blood transfusion    "several" (01/27/2016)  . History of stomach ulcers   . Hyperlipidemia   . Hypertension   . Hypothyroidism   . On home oxygen therapy    "2L; 9PM til 9AM" (06/27/2017)  . PAD (peripheral artery disease) ([place])    a. prior gangrene, toe amputations, intervention.  [name] PAF (paroxysmal atrial fibrillation) ([place])   . Paraesophageal hernia   . Perforated gastric ulcer ([place])   . Pneumonia    "a few times"  (06/27/2017)  . Seasonal allergies   . SIADH (syndrome of inappropriate ADH production) ([place])    [name] 01/10/2015  . Small bowel obstruction ([place])    "I don't know how many" (01/11/2015)  . Stroke [facility])    several- no residual  . Type II diabetes mellitus ([place])    "related to prednisone use  for > 20 yr; once predinose stopped; no more DM RX" (01/27/2016)  . UTI (urinary tract infection) 02/08/2016  . Ventral hernia with bowel obstruction       Objective/Physical Exam General: The patient is alert and oriented x3 in no acute distress.  Dermatology:  Wounds noted to 2nd and 3rd and 4th digits of the right foot, both measuring 0.5 x 0.5 x 0.1 cm (LxWxD).  Today the right second toe appears acutely inflamed and swollen likely secondary to the chronic osteomyelitis within the toe with a acute episode.  Purulent drainage noted from the toe.  There is no malodor.  The ulceration to the distal tip of the toe does probe to bone.  Vascular: Diminished pedal pulses bilaterally.  Edema noted to the right second toe. Capillary refill within normal limits.  Neurological: Epicritic and protective threshold diminished bilaterally.   Musculoskeletal Exam: History of partial first ray amputation right foot.  History of partial toe amputations right foot.  Assessment:  1. Ulcerations noted to digits 2 and 3 and 4 secondary to diabetes mellitus 2. diabetes mellitus w/ peripheral neuropathy   Plan of Care:  1. Patient was evaluated. 2.  Today I explained to the daughter and the patient that I do believe it would be in her best interest at this time to have surgical amputation, not only of the second toe, but of a transmetatarsal amputation of the right foot.  The patient already has a history of partial first ray amputation secondary to acute infection with osteomyelitis.  We have been trying to avoid this given the patient's age however at this point I believe it is warranted and necessary.  All possible  complications and details the procedure were explained.  No guarantees were expressed or implied. 3.  Today authorization for surgery was initiated today.  Surgery will consist of transmetatarsal amputation right foot.  Prior to surgery we will need vascular and medical clearance from her PCP. 4.  Surgery will be performed outpatient at one of the colon facilities, possibly [facility] or [facility] 5.  Cultures were taken and sent to pathology 6.  Prescription for doxycycline 100 mg #22 times daily x10 days 7.  Continue applying Betadine daily to the toe 8.  I did explain that if the swelling increases and extends proximally up the foot recommend going immediately to the emergency department for admission and surgery would likely be performed inpatient. 9.  Return to clinic 1 week postop  Edrick Kins, DPM [facility]  Dr. Edrick Kins, [address]                                        [place], [place] [address]                Office [phone]  Fax [phone]

## 2019-10-21 NOTE — Progress Notes (Signed)
This service is provided via telemedicine  No vital signs collected/recorded due to the encounter was a telemedicine visit.   Location of patient (ex: home, work):  Home  Patient consents to a telephone visit:  Yes, see telephone consent dated 10/21/2019  Location of the provider (ex: office, home):  [place]  Name of any referring provider:  N/A  Names of all persons participating in the telemedicine service and their role in the encounter:  Sherrie Mustache, Nurse Practitioner, Carroll Kinds, CMA, patient and [name] (daughter)   Time spent on call: 15  minutes with medical assistant

## 2019-10-21 NOTE — Patient Instructions (Signed)
[name] , Thank you for taking time to come for your Medicare Wellness Visit. I appreciate your ongoing commitment to your health goals. Please review the following plan we discussed and let me know if I can assist you in the future.   Screening recommendations/referrals: Colonoscopy aged out Mammogram aged out Bone Density ordered today Recommended yearly ophthalmology/optometry visit for glaucoma screening and checkup Recommended yearly dental visit for hygiene and checkup  Vaccinations: Influenza vaccine up to date Pneumococcal vaccine up to date Tdap vaccine up to date Shingles vaccine DUE- to get at local pharmacy     Advanced directives: on file.   Conditions/risks identified: age, complications related to diabetes, progressive memory loss.   Next appointment: 1 year    Preventive Care 53 Years and Older, Female Preventive care refers to lifestyle choices and visits with your health care provider that can promote health and wellness. What does preventive care include?  A yearly physical exam. This is also called an annual well check.  Dental exams once or twice a year.  Routine eye exams. Ask your health care provider how often you should have your eyes checked.  Personal lifestyle choices, including:  Daily care of your teeth and gums.  Regular physical activity.  Eating a healthy diet.  Avoiding tobacco and drug use.  Limiting alcohol use.  Practicing safe sex.  Taking low-dose aspirin every day.  Taking vitamin and mineral supplements as recommended by your health care provider. What happens during an annual well check? The services and screenings done by your health care provider during your annual well check will depend on your age, overall health, lifestyle risk factors, and family history of disease. Counseling  Your health care provider may ask you questions about your:  Alcohol use.  Tobacco use.  Drug use.  Emotional well-being.  Home  and relationship well-being.  Sexual activity.  Eating habits.  History of falls.  Memory and ability to understand (cognition).  Work and work Statistician.  Reproductive health. Screening  You may have the following tests or measurements:  Height, weight, and BMI.  Blood pressure.  Lipid and cholesterol levels. These may be checked every 5 years, or more frequently if you are over 65 years old.  Skin check.  Lung cancer screening. You may have this screening every year starting at age 46 if you have a 30-pack-year history of smoking and currently smoke or have quit within the past 15 years.  Fecal occult blood test (FOBT) of the stool. You may have this test every year starting at age 63.  Flexible sigmoidoscopy or colonoscopy. You may have a sigmoidoscopy every 5 years or a colonoscopy every 10 years starting at age 30.  Hepatitis C blood test.  Hepatitis B blood test.  Sexually transmitted disease (STD) testing.  Diabetes screening. This is done by checking your blood sugar (glucose) after you have not eaten for a while (fasting). You may have this done every 1-3 years.  Bone density scan. This is done to screen for osteoporosis. You may have this done starting at age 84.  Mammogram. This may be done every 1-2 years. Talk to your health care provider about how often you should have regular mammograms. Talk with your health care provider about your test results, treatment options, and if necessary, the need for more tests. Vaccines  Your health care provider may recommend certain vaccines, such as:  Influenza vaccine. This is recommended every year.  Tetanus, diphtheria, and acellular pertussis (Tdap, Td)  vaccine. You may need a Td booster every 10 years.  Zoster vaccine. You may need this after age 58.  Pneumococcal 13-valent conjugate (PCV13) vaccine. One dose is recommended after age 73.  Pneumococcal polysaccharide (PPSV23) vaccine. One dose is recommended  after age 81. Talk to your health care provider about which screenings and vaccines you need and how often you need them. This information is not intended to replace advice given to you by your health care provider. Make sure you discuss any questions you have with your health care provider. Document Released: 04/16/2015 Document Revised: 12/08/2015 Document Reviewed: 01/19/2015 Elsevier Interactive Patient Education  2017 [place] Prevention in the Home Falls can cause injuries. They can happen to people of all ages. There are many things you can do to make your home safe and to help prevent falls. What can I do on the outside of my home?  Regularly fix the edges of walkways and driveways and fix any cracks.  Remove anything that might make you trip as you walk through a door, such as a raised step or threshold.  Trim any bushes or trees on the path to your home.  Use bright outdoor lighting.  Clear any walking paths of anything that might make someone trip, such as rocks or tools.  Regularly check to see if handrails are loose or broken. Make sure that both sides of any steps have handrails.  Any raised decks and porches should have guardrails on the edges.  Have any leaves, snow, or ice cleared regularly.  Use sand or salt on walking paths during winter.  Clean up any spills in your garage right away. This includes oil or grease spills. What can I do in the bathroom?  Use night lights.  Install grab bars by the toilet and in the tub and shower. Do not use towel bars as grab bars.  Use non-skid mats or decals in the tub or shower.  If you need to sit down in the shower, use a plastic, non-slip stool.  Keep the floor dry. Clean up any water that spills on the floor as soon as it happens.  Remove soap buildup in the tub or shower regularly.  Attach bath mats securely with double-sided non-slip rug tape.  Do not have throw rugs and other things on the floor  that can make you trip. What can I do in the bedroom?  Use night lights.  Make sure that you have a light by your bed that is easy to reach.  Do not use any sheets or blankets that are too big for your bed. They should not hang down onto the floor.  Have a firm chair that has side arms. You can use this for support while you get dressed.  Do not have throw rugs and other things on the floor that can make you trip. What can I do in the kitchen?  Clean up any spills right away.  Avoid walking on wet floors.  Keep items that you use a lot in easy-to-reach places.  If you need to reach something above you, use a strong step stool that has a grab bar.  Keep electrical cords out of the way.  Do not use floor polish or wax that makes floors slippery. If you must use wax, use non-skid floor wax.  Do not have throw rugs and other things on the floor that can make you trip. What can I do with my stairs?  Do not leave  any items on the stairs.  Make sure that there are handrails on both sides of the stairs and use them. Fix handrails that are broken or loose. Make sure that handrails are as long as the stairways.  Check any carpeting to make sure that it is firmly attached to the stairs. Fix any carpet that is loose or worn.  Avoid having throw rugs at the top or bottom of the stairs. If you do have throw rugs, attach them to the floor with carpet tape.  Make sure that you have a light switch at the top of the stairs and the bottom of the stairs. If you do not have them, ask someone to add them for you. What else can I do to help prevent falls?  Wear shoes that:  Do not have high heels.  Have rubber bottoms.  Are comfortable and fit you well.  Are closed at the toe. Do not wear sandals.  If you use a stepladder:  Make sure that it is fully opened. Do not climb a closed stepladder.  Make sure that both sides of the stepladder are locked into place.  Ask someone to hold it  for you, if possible.  Clearly mark and make sure that you can see:  Any grab bars or handrails.  First and last steps.  Where the edge of each step is.  Use tools that help you move around (mobility aids) if they are needed. These include:  Canes.  Walkers.  Scooters.  Crutches.  Turn on the lights when you go into a dark area. Replace any light bulbs as soon as they burn out.  Set up your furniture so you have a clear path. Avoid moving your furniture around.  If any of your floors are uneven, fix them.  If there are any pets around you, be aware of where they are.  Review your medicines with your doctor. Some medicines can make you feel dizzy. This can increase your chance of falling. Ask your doctor what other things that you can do to help prevent falls. This information is not intended to replace advice given to you by your health care provider. Make sure you discuss any questions you have with your health care provider. Document Released: 01/14/2009 Document Revised: 08/26/2015 Document Reviewed: 04/24/2014 Elsevier Interactive Patient Education  2017 [employer].

## 2019-10-21 NOTE — Progress Notes (Signed)
Subjective:   Shannon [name] is a 86 y.o. female who presents for Medicare Annual (Subsequent) preventive examination.  Review of Systems     Cardiac Risk Factors include: advanced age (>87men, >8 women);diabetes mellitus;dyslipidemia;hypertension      Objective:    There were no vitals filed for this visit. There is no height or weight on file to calculate BMI.  Advanced Directives 10/21/2019 09/24/2019 09/11/2019 08/28/2019 07/21/2019 07/18/2019 07/07/2019  Does Patient Have a Medical Advance Directive? Yes No Yes Yes Yes Yes Yes  Type of Product manager Power of [employer] Power of [employer] Power of [place];Living will  Does patient want to make changes to medical advance directive? No - Patient declined - No - Patient declined No - Patient declined No - Patient declined No - Patient declined -  Copy of [place] in Chart? Yes - validated most recent copy scanned in chart (See row information) - Yes - validated most recent copy scanned in chart (See row information) Yes - validated most recent copy scanned in chart (See row information) - Yes - validated most recent copy scanned in chart (See row information) -  Would patient like information on creating a medical advance directive? - - - - - - -    Current Medications (verified) Outpatient Encounter Medications as of 10/21/2019  Medication Sig  . acetaminophen (TYLENOL) 500 MG tablet Take 500 mg by mouth every 6 (six) hours as needed (pain).   . Amino Acids-Protein Hydrolys (FEEDING SUPPLEMENT, PRO-STAT SUGAR FREE 64,) LIQD Take 30 mLs by mouth 2 (two) times daily.  [name] amLODipine (NORVASC) 5 MG tablet Take 1 tablet (5 mg total) by mouth daily.  [name] atorvastatin (LIPITOR) 10 MG tablet TAKE 1 TABLET BY MOUTH EVERY DAY  . b complex vitamins tablet Take 1 tablet by mouth daily.   . benzocaine (AMERICAINE)  20 % rectal ointment Place rectally every 3 (three) hours as needed for pain.  . calcium-vitamin D (OSCAL WITH D) 500-200 MG-UNIT per tablet Take 1 tablet by mouth daily with breakfast.   . Carboxymethylcellulose Sodium (ARTIFICIAL TEARS OP) Place 1 drop into both eyes 4 (four) times daily.  . cycloSPORINE (RESTASIS) 0.05 % ophthalmic emulsion USE 1 DROP INTO BOTH EYES TWICE DAILY  . DULERA 200-5 MCG/ACT AERO TAKE TWO PUFFS BY MOUTH TWICE DAILY  . feeding supplement (OSMOLITE 1 CAL) LIQD Take 711 mLs by mouth in the morning and at bedtime.   . ferrous sulfate 220 (44 Fe) MG/5ML solution TAKE 7.4 MLS (325 MG TOTAL) BY MOUTH DAILY.  . fluticasone (FLONASE) 50 MCG/ACT nasal spray SPRAY 2 SPRAYS INTO EACH NOSTRIL EVERY DAY  . furosemide (LASIX) 20 MG tablet Take 1 tablet (20 mg total) by mouth daily.  [name] gabapentin (NEURONTIN) 250 MG/5ML solution TAKE 6 MLS (300 MG TOTAL) BY MOUTH 2 (TWO) TIMES DAILY.  [name] gentamicin cream (GARAMYCIN) 0.1 % Apply 1 application topically daily as needed (dry skin).  . hydrALAZINE (APRESOLINE) 50 MG tablet TAKE 1 TABLET BY MOUTH THREE TIMES A DAY  . insulin aspart (NOVOLOG) 100 UNIT/ML FlexPen Inject 0-6 Units into the skin 3 (three) times daily with meals. New sliding Scale If BS is 151 - 251 = 2 units,  If BS is 251 -  350 = 4 units,  If BS is 351 - 450 = 6 units Call provider if BS runs more than 400  .  Insulin Glargine (LANTUS SOLOSTAR) 100 UNIT/ML Solostar Pen Inject 8 Units into the skin at bedtime.  . Insulin Pen Needle (B-D UF III MINI PEN NEEDLES) 31G X 5 MM MISC Use twice daily with giving insulin injections. Dx E11.9  . isosorbide mononitrate (IMDUR) 30 MG 24 hr tablet TAKE 2 TABLETS BY MOUTH DAILY. PLEASE MAKE ANNUAL APPT WITH DR. Meda Coffee FOR FUTURE REFILLS  . latanoprost (XALATAN) 0.005 % ophthalmic solution Place 1 drop into both eyes at bedtime.  [name] levothyroxine (SYNTHROID) 125 MCG tablet TAKE 1 TABLET BY MOUTH EVERY DAY  . loratadine (CLARITIN) 10 MG tablet  Take 10 mg by mouth daily as needed for allergies.  . Multiple Vitamin (MULTIVITAMIN WITH MINERALS) TABS tablet Take 1 tablet by mouth daily.  [name] nystatin (MYCOSTATIN/NYSTOP) powder Apply 1 application topically as needed (skin irritation).  [name] oxyCODONE (ROXICODONE) 5 MG immediate release tablet Take 1 tablet (5 mg total) by mouth every 4 (four) hours as needed for severe pain. May take 1/2 tab q4hrs as needed for mild pain.  . pantoprazole (PROTONIX) 40 MG tablet TAKE 1 TABLET BY MOUTH EVERY DAY  . polyethylene glycol (MIRALAX / GLYCOLAX) 17 g packet Take 17 g by mouth daily.  . Probiotic Product (EQL PROBIOTIC COLON SUPPORT PO) Take 1 capsule by mouth daily.  [name] senna-docusate (SENOKOT-S) 8.6-50 MG tablet Take 1 tablet by mouth at bedtime as needed for mild constipation or moderate constipation.  . sertraline (ZOLOFT) 50 MG tablet Take 2  tabs = 100mg  orally daily  . warfarin (COUMADIN) 2.5 MG tablet Take 2.5 mg by mouth daily.  [name] warfarin (COUMADIN) 2.5 MG tablet Take 2.5 mg by mouth daily. Takes 3 tablets on Sun and Thursday, takes 2 tabs all other days.  . Water For Irrigation, Sterile (FREE WATER) SOLN Place 180 mLs into feeding tube 3 (three) times daily.   Dema Severin Petrolatum-Mineral Oil (SYSTANE NIGHTTIME) OINT Place 1 application into both eyes at bedtime.    No facility-administered encounter medications on file as of 10/21/2019.    Allergies (verified) Penicillins   History: Past Medical History:  Diagnosis Date  . Acute respiratory failure ([place])   . Anemia    previous blood transfusions  . Arthritis    "all over"  . Asthma   . Atrial flutter ([place])   . Bradycardia    requiring previous d/c of BB and reduction of amiodarone  . CAD (coronary artery disease)    nonobstructive per notes  . Chronic diastolic CHF (congestive heart failure) ([place])   . CKD (chronic kidney disease), stage III   . Complication of blood transfusion    "got the wrong blood type at [facility] in ~ 2015;  no adverse reaction that we are aware of"/daughter, [name] (01/27/2016)  . COPD (chronic obstructive pulmonary disease) ([place])   . Depression    "light case"  . DVT (deep venous thrombosis) ([place]) 01/2016   a. LLE DVT 01/2016 - switched from Eliquis to Coumadin.  [name] Dyspnea    with some activity  . Gastric stenosis    a. s/p stomach tube  . GERD (gastroesophageal reflux disease)   . History of blood transfusion    "several" (01/27/2016)  . History of stomach ulcers   . Hyperlipidemia   . Hypertension   . Hypothyroidism   . On home oxygen therapy    "2L; 9PM til 9AM" (06/27/2017)  . PAD (peripheral artery disease) ([place])    a. prior gangrene, toe amputations, intervention.  [name]  PAF (paroxysmal atrial fibrillation) ([place])   . Paraesophageal hernia   . Perforated gastric ulcer ([place])   . Pneumonia    "a few times" (06/27/2017)  . Seasonal allergies   . SIADH (syndrome of inappropriate ADH production) ([place])    [name] 01/10/2015  . Small bowel obstruction ([place])    "I don't know how many" (01/11/2015)  . Stroke [facility])    several- no residual  . Type II diabetes mellitus ([place])    "related to prednisone use  for > 20 yr; once predinose stopped; no more DM RX" (01/27/2016)  . UTI (urinary tract infection) 02/08/2016  . Ventral hernia with bowel obstruction    Past Surgical History:  Procedure Laterality Date  . ABDOMINAL AORTOGRAM N/A 09/21/2016   Procedure: Abdominal Aortogram;  Surgeon: Waynetta Sandy, MD;  Location: [facility];  Service: Cardiovascular;  Laterality: N/A;  . AMPUTATION Left 09/25/2016   Procedure: AMPUTATION DIGIT- LEFT 2ND AND 3RD TOES;  Surgeon: Rosetta Posner, MD;  Location: [facility];  Service: Vascular;  Laterality: Left;  . AMPUTATION Right 05/03/2018   Procedure: AMPUTATION RIGHT GREAT TOE;  Surgeon: Rosetta Posner, MD;  Location: [facility];  Service: Vascular;  Laterality: Right;  . CATARACT EXTRACTION W/ INTRAOCULAR LENS  IMPLANT, BILATERAL    .  CHOLECYSTECTOMY OPEN    . COLECTOMY    . ESOPHAGOGASTRODUODENOSCOPY N/A 01/19/2014   Procedure: ESOPHAGOGASTRODUODENOSCOPY (EGD);  Surgeon: Irene Shipper, MD;  [facility];  Service: Endoscopy;  Laterality: N/A;  . ESOPHAGOGASTRODUODENOSCOPY N/A 01/20/2014   Procedure: ESOPHAGOGASTRODUODENOSCOPY (EGD);  Surgeon: Irene Shipper, MD;  Location: [facility];  Service: Endoscopy;  Laterality: N/A;  . ESOPHAGOGASTRODUODENOSCOPY N/A 03/19/2014   Procedure: ESOPHAGOGASTRODUODENOSCOPY (EGD);  Surgeon: Milus Banister, MD;  Location: [facility];  Service: Endoscopy;  Laterality: N/A;  . ESOPHAGOGASTRODUODENOSCOPY N/A 07/08/2015   Procedure: ESOPHAGOGASTRODUODENOSCOPY (EGD);  Surgeon: Doran Stabler, MD;  Location: [facility];  Service: Endoscopy;  Laterality: N/A;  . ESOPHAGOGASTRODUODENOSCOPY (EGD) WITH PROPOFOL N/A 09/15/2015   Procedure: ESOPHAGOGASTRODUODENOSCOPY (EGD) WITH PROPOFOL;  Surgeon: Manus Gunning, MD;  [facility];  Service: Gastroenterology;  Laterality: N/A;  . GASTROJEJUNOSTOMY     hx/notes 01/10/2015  . GASTROJEJUNOSTOMY N/A 09/23/2015   Procedure: OPEN GASTROJEJUNOSTOMY TUBE PLACEMENT;  Surgeon: Arta Bruce Kinsinger, MD;  [facility];  Service: General;  Laterality: N/A;  . GLAUCOMA SURGERY Bilateral   . HERNIA REPAIR  2015  . IR CM INJ ANY COLONIC TUBE W/FLUORO  01/05/2017  . IR CM INJ ANY COLONIC TUBE W/FLUORO  06/07/2017  . IR CM INJ ANY COLONIC TUBE W/FLUORO  11/05/2017  . IR CM INJ ANY COLONIC TUBE W/FLUORO  09/18/2018  . IR CM INJ ANY COLONIC TUBE W/FLUORO  03/06/2019  . IR CM INJ ANY COLONIC TUBE W/FLUORO  03/11/2019  . IR GENERIC HISTORICAL  01/07/2016   IR GJ TUBE CHANGE 01/07/2016 Jacqulynn Cadet, MD [facility]  . IR GENERIC HISTORICAL  01/27/2016   IR MECH REMOV OBSTRUC MAT ANY COLON TUBE W/FLUORO 01/27/2016 Markus Daft, MD [facility]  . IR GENERIC HISTORICAL  02/07/2016   IR PATIENT EVAL TECH 0-60 MINS Darrell K Allred, PA-C [facility]  . IR GENERIC HISTORICAL  02/08/2016   IR GJ TUBE CHANGE 02/08/2016 Greggory Keen, MD [facility]  . IR GENERIC HISTORICAL  01/06/2016   IR GJ TUBE CHANGE 01/06/2016 [facility] OUT REF  . IR GENERIC HISTORICAL  05/02/2016   IR CM INJ ANY  COLONIC TUBE W/FLUORO 05/02/2016 Arne Cleveland, MD [facility]  . IR GENERIC HISTORICAL  05/15/2016   IR GJ TUBE CHANGE 05/15/2016 Sandi Mariscal, MD [facility]  . IR GENERIC HISTORICAL  06/28/2016   IR GJ TUBE CHANGE 06/28/2016 [facility]  . IR GJ TUBE CHANGE  02/20/2017  . IR GJ TUBE CHANGE  05/10/2017  . IR GJ TUBE CHANGE  07/06/2017  . IR GJ TUBE CHANGE  08/02/2017  . IR GJ TUBE CHANGE  10/15/2017  . IR GJ TUBE CHANGE  12/26/2017  . IR [place] TUBE CHANGE  04/08/2018  . IR [place] TUBE CHANGE  06/19/2018  . IR [place] TUBE CHANGE  03/03/2019  . IR [place] TUBE CHANGE  06/06/2019  . IR GJ TUBE CHANGE  09/26/2019  . IR PATIENT EVAL TECH 0-60 MINS  10/19/2016  . IR PATIENT EVAL TECH 0-60 MINS  12/25/2016  . IR PATIENT EVAL TECH 0-60 MINS  01/29/2017  . IR PATIENT EVAL TECH 0-60 MINS  04/04/2017  . IR PATIENT EVAL TECH 0-60 MINS  04/30/2017  . IR PATIENT EVAL TECH 0-60 MINS  07/31/2017  . IR REPLC DUODEN/JEJUNO TUBE PERCUT W/FLUORO  11/14/2016  . LAPAROTOMY N/A 01/20/2015   Procedure: EXPLORATORY LAPAROTOMY;  Surgeon: Coralie Keens, MD;  Location: [facility];  Service: General;  Laterality: N/A;  . LOWER EXTREMITY ANGIOGRAPHY Left 09/21/2016   Procedure: Lower Extremity Angiography;  Surgeon: Waynetta Sandy, MD;  Location: [facility];  Service: Cardiovascular;  Laterality: Left;  . LOWER EXTREMITY ANGIOGRAPHY Right 06/27/2017   Procedure: Lower Extremity Angiography;  Surgeon: Waynetta Sandy, MD;  Location: [facility];  Service: Cardiovascular;  Laterality: Right;  . LYSIS OF ADHESION N/A 01/20/2015   Procedure: LYSIS OF ADHESIONS < 1 HOUR;  Surgeon: Coralie Keens, MD;  Location: [facility];  Service: General;  Laterality: N/A;  . PERIPHERAL VASCULAR  BALLOON ANGIOPLASTY Left 09/21/2016   Procedure: Peripheral Vascular Balloon Angioplasty;  Surgeon: Waynetta Sandy, MD;  Location: [facility];  Service: Cardiovascular;  Laterality: Left;  drug coated balloon  . PERIPHERAL VASCULAR BALLOON ANGIOPLASTY Right 06/27/2017   Procedure: PERIPHERAL VASCULAR BALLOON ANGIOPLASTY;  Surgeon: Waynetta Sandy, MD;  Location: [facility];  Service: Cardiovascular;  Laterality: Right;  SFA/TPTRUNK  . TONSILLECTOMY    . TUBAL LIGATION    . VENTRAL HERNIA REPAIR  2015   incarcerated ventral hernia ([facility] 09/2013)/notes 01/10/2015   Family History  Problem Relation Age of Onset  . Stroke Mother   . Hypertension Mother   . Diabetes Brother   . Glaucoma Son   . Glaucoma Son   . Heart attack Neg Hx   . Colon cancer Neg Hx   . Stomach cancer Neg Hx    Social History   Socioeconomic History  . Marital status: Widowed    Spouse name: Not on file  . Number of children: 3  . Years of education: Not on file  . Highest education level: Not on file  Occupational History  . Not on file  Tobacco Use  . Smoking status: Never Smoker  . Smokeless tobacco: Former Systems developer    Types: Snuff  . Tobacco comment: "used snuff in my younger days"  Vaping Use  . Vaping Use: Never used  Substance and Sexual Activity  . Alcohol use: Never    Alcohol/week: 0.0 standard drinks  . Drug use: Never  . Sexual activity: Not Currently  Other Topics Concern  . Not  on file  Social History Narrative   Married in [place], lives in one story house with 2 people and no pets   Occupation: ?   Has a living Will, [place], and doesn't have DNR   Was living with Husband (in frail health) in [place] until 09/2013.  After her [facility] admission they both came to live with daughter in [place] Strain:   . Difficulty of Paying Living Expenses:   Food Insecurity: No Food Insecurity  . Worried About Paediatric nurse in the Last Year: Never true  . Ran Out of Food in the Last Year: Never true  Transportation Needs: No Transportation Needs  . Lack of Transportation (Medical): No  . Lack of Transportation (Non-Medical): No  Physical Activity:   . Days of Exercise per Week:   . Minutes of Exercise per Session:   Stress:   . Feeling of Stress :   Social Connections:   . Frequency of Communication with Friends and Family:   . Frequency of Social Gatherings with Friends and Family:   . Attends Religious Services:   . Active Member of Clubs or Organizations:   . Attends Archivist Meetings:   [name] Marital Status:     Tobacco Counseling Counseling given: Not Answered Comment: "used snuff in my younger days"   Clinical Intake:  Pre-visit preparation completed: Yes  Pain : No/denies pain     BMI - recorded: 22.18 Nutritional Status: BMI of 19-24  Normal Nutritional Risks: Non-healing wound Diabetes: Yes CBG done?: No Did pt. bring in CBG monitor from home?: No  How often do you need to have someone help you when you read instructions, pamphlets, or other written materials from your doctor or pharmacy?: 2 - Rarely  Diabetic?yes         Activities of Daily Living In your present state of health, do you have any difficulty performing the following activities: 10/21/2019 08/28/2019  Hearing? Tempie Donning  Vision? Y N  Difficulty concentrating or making decisions? Y N  Walking or climbing stairs? Y Y  Comment - -  Dressing or bathing? N N  Comment - -  Doing errands, shopping? N Y  Comment - -  Conservation officer, nature and eating ? N -  Comment - -  Using the Toilet? N -  In the past six months, have you accidently leaked urine? Y -  Comment - -  Do you have problems with loss of bowel control? Y -  Managing your Medications? Y -  Comment daughter manages -  Managing your Finances? N -  Comment - -  Housekeeping or managing your Housekeeping? Y -  Comment daughter manages -    Some recent data might be hidden    Patient Care Team: [name], NP as PCP - General (Geriatric Medicine) Dorothy Spark, MD as PCP - Cardiology (Cardiology) Edrick Kins, DPM as Consulting Physician (Podiatry) Dorothy Spark, MD as Consulting Physician (Cardiology) [name], NP as Nurse Practitioner ([place]) Camillo Flaming, [place] as Referring Physician (Optometry) [name], RN as [place] any recent Medical Services you may have received from other than [facility] providers in the past year (date may be approximate).     Assessment:   This is a routine wellness examination for Shannon [name].  Hearing/Vision screen  Hearing Screening   125Hz  250Hz  500Hz  1000Hz  2000Hz  3000Hz  4000Hz  6000Hz   8000Hz   Right ear:           Left ear:           Comments: Patient states that she wears hearing aids  Vision Screening Comments: Patient wears glasses. Patient had eye exam within the last year  Dietary issues and exercise activities discussed: Current Exercise Habits: The patient does not participate in regular exercise at present  Goals    .  More Housework      Pt would like to do more housework and stay active.    .  Patient would like to have relief from hemorrhoids (pt-stated)      CARE PLAN ENTRY (see longitudinal plan of care for additional care plan information)  Current Barriers:  [name] Knowledge Deficits related to treatment for hemorrhoids  Nurse Case Manager Clinical Goal(s):  [name] Over the next 31 days, patient will meet with RN Care Manager to address recovery from hemorrhoids . Over the next 28 days, patient will attend all scheduled medical appointments: with PCP  . Over the next 28 days, the patient will demonstrate ongoing self health care management ability as evidenced by ability to stay independent*  Interventions:  . Inter-disciplinary care team collaboration (see longitudinal plan of  care) . Reviewed medications with patient and discussed use of Tucks versus Preparation H . Discussed plans with patient for ongoing care management follow up and provided patient with direct contact information for care management team . Reviewed scheduled/upcoming provider appointments including:   Patient Self Care Activities:  . Attends all scheduled provider appointments . Performs ADL's independently . Unable to perform IADLs independently  Initial goal documentation       Depression Screen PHQ 2/9 Scores 10/21/2019 07/21/2019 10/10/2018 08/07/2018 10/05/2017 02/28/2017 10/25/2016  PHQ - 2 Score 0 0 0 0 0 0 -  Exception Documentation - - - - - - Other- indicate reason in comment box  Not completed - - - - - - call completed with dtr    Fall Risk Fall Risk  10/21/2019 10/20/2019 09/11/2019 07/21/2019 07/18/2019  Falls in the past year? 0 0 0 0 0  Number falls in past yr: 0 0 0 0 0  Injury with Fall? 0 - 0 0 0  Risk for fall due to : - - - - -  Follow up - - - - -    Any stairs in or around the home? Yes  If so, are there any without handrails? No  Home free of loose throw rugs in walkways, pet beds, electrical cords, etc? Yes  Adequate lighting in your home to reduce risk of falls? Yes   ASSISTIVE DEVICES UTILIZED TO PREVENT FALLS:  Life alert? No  Use of a cane, walker or w/c? Yes  Grab bars in the bathroom? No  Shower chair or bench in shower? Yes  Elevated toilet seat or a handicapped toilet? Yes   TIMED UP AND GO:na  Cognitive Function: MMSE - Mini Mental State Exam 10/05/2017 08/30/2016 08/29/2015 11/06/2014  Orientation to time 5 5 5 5   Orientation to Place 5 4 5 5   Registration 3 3 3 3   Attention/ Calculation 3 4 5 3   Recall 0 0 2 2  Language- name 2 objects 2 2 2 2   Language- repeat 1 1 1 1   Language- follow 3 step command 3 3 3 3   Language- read & follow direction 1 1 1 1   Write a sentence 1 0 0 0  Copy design 1 0  0 1  Total score 25 23 27 26      6CIT Screen  10/21/2019 10/10/2018  What Year? 0 points 0 points  What month? 0 points 0 points  What time? 0 points 0 points  Count back from 20 0 points 0 points  Months in reverse 2 points 0 points  Repeat phrase 6 points 6 points  Total Score 8 6    Immunizations Immunization History  Administered Date(s) Administered  . Fluad Quad(high Dose 65+) 12/02/2018  . Influenza, High Dose Seasonal PF 01/19/2017  . Influenza,inj,Quad PF,6+ Mos 12/24/2013, 01/27/2015, 12/29/2015, 12/04/2017  . Influenza-Unspecified 12/02/2012  . Janssen (J&J) SARS-COV-2 Vaccination 07/11/2019  . Pneumococcal Conjugate-13 02/03/2014  . Pneumococcal Polysaccharide-23 08/31/2016  . Tdap 07/18/2016  . Zoster 04/12/2011    TDAP status: Up to date Flu Vaccine status: Up to date Pneumococcal vaccine status: Up to date Covid-19 vaccine status: Completed vaccines  Qualifies for Shingles Vaccine? Yes   Zostavax completed Yes   Shingrix Completed?: No.    Education has been provided regarding the importance of this vaccine. Patient has been advised to call insurance company to determine out of pocket expense if they have not yet received this vaccine. Advised may also receive vaccine at local pharmacy or Health Dept. Verbalized acceptance and understanding.  Screening Tests Health Maintenance  Topic Date Due  . HEMOGLOBIN A1C  08/30/2019  . INFLUENZA VACCINE  11/02/2019  . OPHTHALMOLOGY EXAM  12/16/2019  . FOOT EXAM  10/19/2020  . TETANUS/TDAP  07/19/2026  . DEXA SCAN  Completed  . COVID-19 Vaccine  Completed  . PNA vac Low Risk Adult  Completed    Health Maintenance  Health Maintenance Due  Topic Date Due  . HEMOGLOBIN A1C  08/30/2019    Colorectal cancer screening: No longer required.  Mammogram status: No longer required.  Bone Density status: Completed 2017. Results reflect: Bone density results: OSTEOPENIA. Repeat every 2 years.  Lung Cancer Screening: (Low Dose CT Chest recommended if Age 67-80 years,  30 pack-year currently smoking OR have quit w/in 15years.) does not qualify.   Lung Cancer Screening Referral: na  Additional Screening:  Hepatitis C Screening: does not qualify; Completed na  Vision Screening: Recommended annual ophthalmology exams for early detection of glaucoma and other disorders of the eye. Is the patient up to date with their annual eye exam?  Yes  Who is the provider or what is the name of the office in which the patient attends annual eye exams? Dr Juliane Poot If pt is not established with a provider, would they like to be referred to a provider to establish care? No .   Dental Screening: Recommended annual dental exams for proper oral hygiene  Community Resource Referral / Chronic Care Management: CRR required this visit?  No   CCM required this visit?  No      Plan:     I have personally reviewed and noted the following in the patient's chart:   . Medical and social history . Use of alcohol, tobacco or illicit drugs  . Current medications and supplements . Functional ability and status . Nutritional status . Physical activity . Advanced directives . List of other physicians . Hospitalizations, surgeries, and ER visits in previous 12 months . Vitals . Screenings to include cognitive, depression, and falls . Referrals and appointments  In addition, I have reviewed and discussed with patient certain preventive protocols, quality metrics, and best practice recommendations. A written personalized care plan for preventive services as  well as general preventive health recommendations were provided to patient.     [name], NP   10/21/2019

## 2019-10-22 ENCOUNTER — Other Ambulatory Visit: Payer: Self-pay

## 2019-10-22 DIAGNOSIS — E1122 Type 2 diabetes mellitus with diabetic chronic kidney disease: Secondary | ICD-10-CM

## 2019-10-23 ENCOUNTER — Other Ambulatory Visit: Payer: Medicare Other

## 2019-10-23 ENCOUNTER — Other Ambulatory Visit: Payer: Self-pay

## 2019-10-23 DIAGNOSIS — Z794 Long term (current) use of insulin: Secondary | ICD-10-CM

## 2019-10-23 DIAGNOSIS — E1122 Type 2 diabetes mellitus with diabetic chronic kidney disease: Secondary | ICD-10-CM

## 2019-10-23 DIAGNOSIS — E1121 Type 2 diabetes mellitus with diabetic nephropathy: Secondary | ICD-10-CM | POA: Diagnosis not present

## 2019-10-23 DIAGNOSIS — N1832 Chronic kidney disease, stage 3b: Secondary | ICD-10-CM | POA: Diagnosis not present

## 2019-10-24 LAB — HEMOGLOBIN A1C
Hgb A1c MFr Bld: 6.3 % of total Hgb — ABNORMAL HIGH (ref <5.7)
Mean Plasma Glucose: 134 (calc)
eAG (mmol/L): 7.4 (calc)

## 2019-10-24 NOTE — Progress Notes (Signed)
HgbA1C is same as 7 months ago. No change with current regimen.

## 2019-11-05 ENCOUNTER — Encounter: Payer: Self-pay | Admitting: Podiatry

## 2019-11-05 ENCOUNTER — Other Ambulatory Visit: Payer: Self-pay

## 2019-11-05 ENCOUNTER — Telehealth: Payer: Self-pay | Admitting: Cardiology

## 2019-11-05 ENCOUNTER — Ambulatory Visit (INDEPENDENT_AMBULATORY_CARE_PROVIDER_SITE_OTHER): Payer: Medicare Other | Admitting: Pharmacist

## 2019-11-05 DIAGNOSIS — I4891 Unspecified atrial fibrillation: Secondary | ICD-10-CM

## 2019-11-05 DIAGNOSIS — I4892 Unspecified atrial flutter: Secondary | ICD-10-CM | POA: Diagnosis not present

## 2019-11-05 DIAGNOSIS — Z5181 Encounter for therapeutic drug level monitoring: Secondary | ICD-10-CM | POA: Diagnosis not present

## 2019-11-05 LAB — POCT INR: INR: 3.8 — AB (ref 2.0–3.0)

## 2019-11-05 NOTE — Telephone Encounter (Signed)
   [facility] Pre-operative Risk Assessment    Request for surgical clearance:  1. What type of surgery is being performed? Transmetatarsal amputation (TMA) of Right Foot  2. When is this surgery scheduled? 11/14/19   3. What type of clearance is required (medical clearance vs. Pharmacy clearance to hold med vs. Both)? Both   4. Are there any medications that need to be held prior to surgery and how long? Coumadin - their office is inquiring about how long/whether or not medication needs to be held.  5. Practice name and name of physician performing surgery? [facility])  Dr. Daylene Katayama  6. What is your office phone number? [phone]    7.   What is your office fax number? [phone]  8.   Anesthesia type (None, local, MAC, general) ? MAC   Shannon [name] 11/05/2019, 3:49 PM  _________________________________________________________________   (provider comments below)

## 2019-11-05 NOTE — Patient Instructions (Signed)
Hold warfarin today then continue on same dosage 2 tablets daily except 3 tablets on Sundays and Thursdays. Recheck in 3 weeks. Call [facility] with any new medications [phone].

## 2019-11-06 NOTE — Telephone Encounter (Signed)
Patient with diagnosis of afib on warfarin for anticoagulation.    Procedure: Transmetatarsal amputation (TMA) of Right Foot Date of procedure: 11/14/19  CHADS2-VASc score of 49 (age x2, sex, CHF, HTN, DM, CAD, stroke), DVT in 2017 (changed from Eliquis to warfarin), as well as lupus.  CrCl 55mL/min Platelet count 240K  Per office protocol, patient can hold warfarin for 5 days prior to procedure. She would typically require bridging with Lovenox (lower 1mg /kg once daily dosing due to reduced renal function), however due to advanced age will confirm with MD that pt should be bridged. Of note, Dr Meda Coffee is out of office this week so procedure may need to be moved.

## 2019-11-07 ENCOUNTER — Ambulatory Visit (INDEPENDENT_AMBULATORY_CARE_PROVIDER_SITE_OTHER): Payer: Medicare Other | Admitting: Family

## 2019-11-07 ENCOUNTER — Other Ambulatory Visit: Payer: Self-pay

## 2019-11-07 ENCOUNTER — Encounter: Payer: Self-pay | Admitting: Family

## 2019-11-07 VITALS — BP 140/70 | HR 71 | Temp 97.7°F | Resp 16 | Ht 66.0 in | Wt 133.8 lb

## 2019-11-07 DIAGNOSIS — I70235 Atherosclerosis of native arteries of right leg with ulceration of other part of foot: Secondary | ICD-10-CM | POA: Diagnosis not present

## 2019-11-07 DIAGNOSIS — E1142 Type 2 diabetes mellitus with diabetic polyneuropathy: Secondary | ICD-10-CM

## 2019-11-07 DIAGNOSIS — D638 Anemia in other chronic diseases classified elsewhere: Secondary | ICD-10-CM

## 2019-11-07 DIAGNOSIS — I5032 Chronic diastolic (congestive) heart failure: Secondary | ICD-10-CM

## 2019-11-07 DIAGNOSIS — Z01818 Encounter for other preprocedural examination: Secondary | ICD-10-CM

## 2019-11-07 DIAGNOSIS — I4819 Other persistent atrial fibrillation: Secondary | ICD-10-CM | POA: Diagnosis not present

## 2019-11-07 IMAGING — CR DG ABDOMEN 2V
2 series · 2 of 2 positions shown · non-contrast
Comparison: Prior CT scan of the abdomen and pelvis 02/20/2017

CLINICAL DATA: [AGE] female with abdominal distension

EXAM:
ABDOMEN - 2 VIEW

[w abdomen decub]
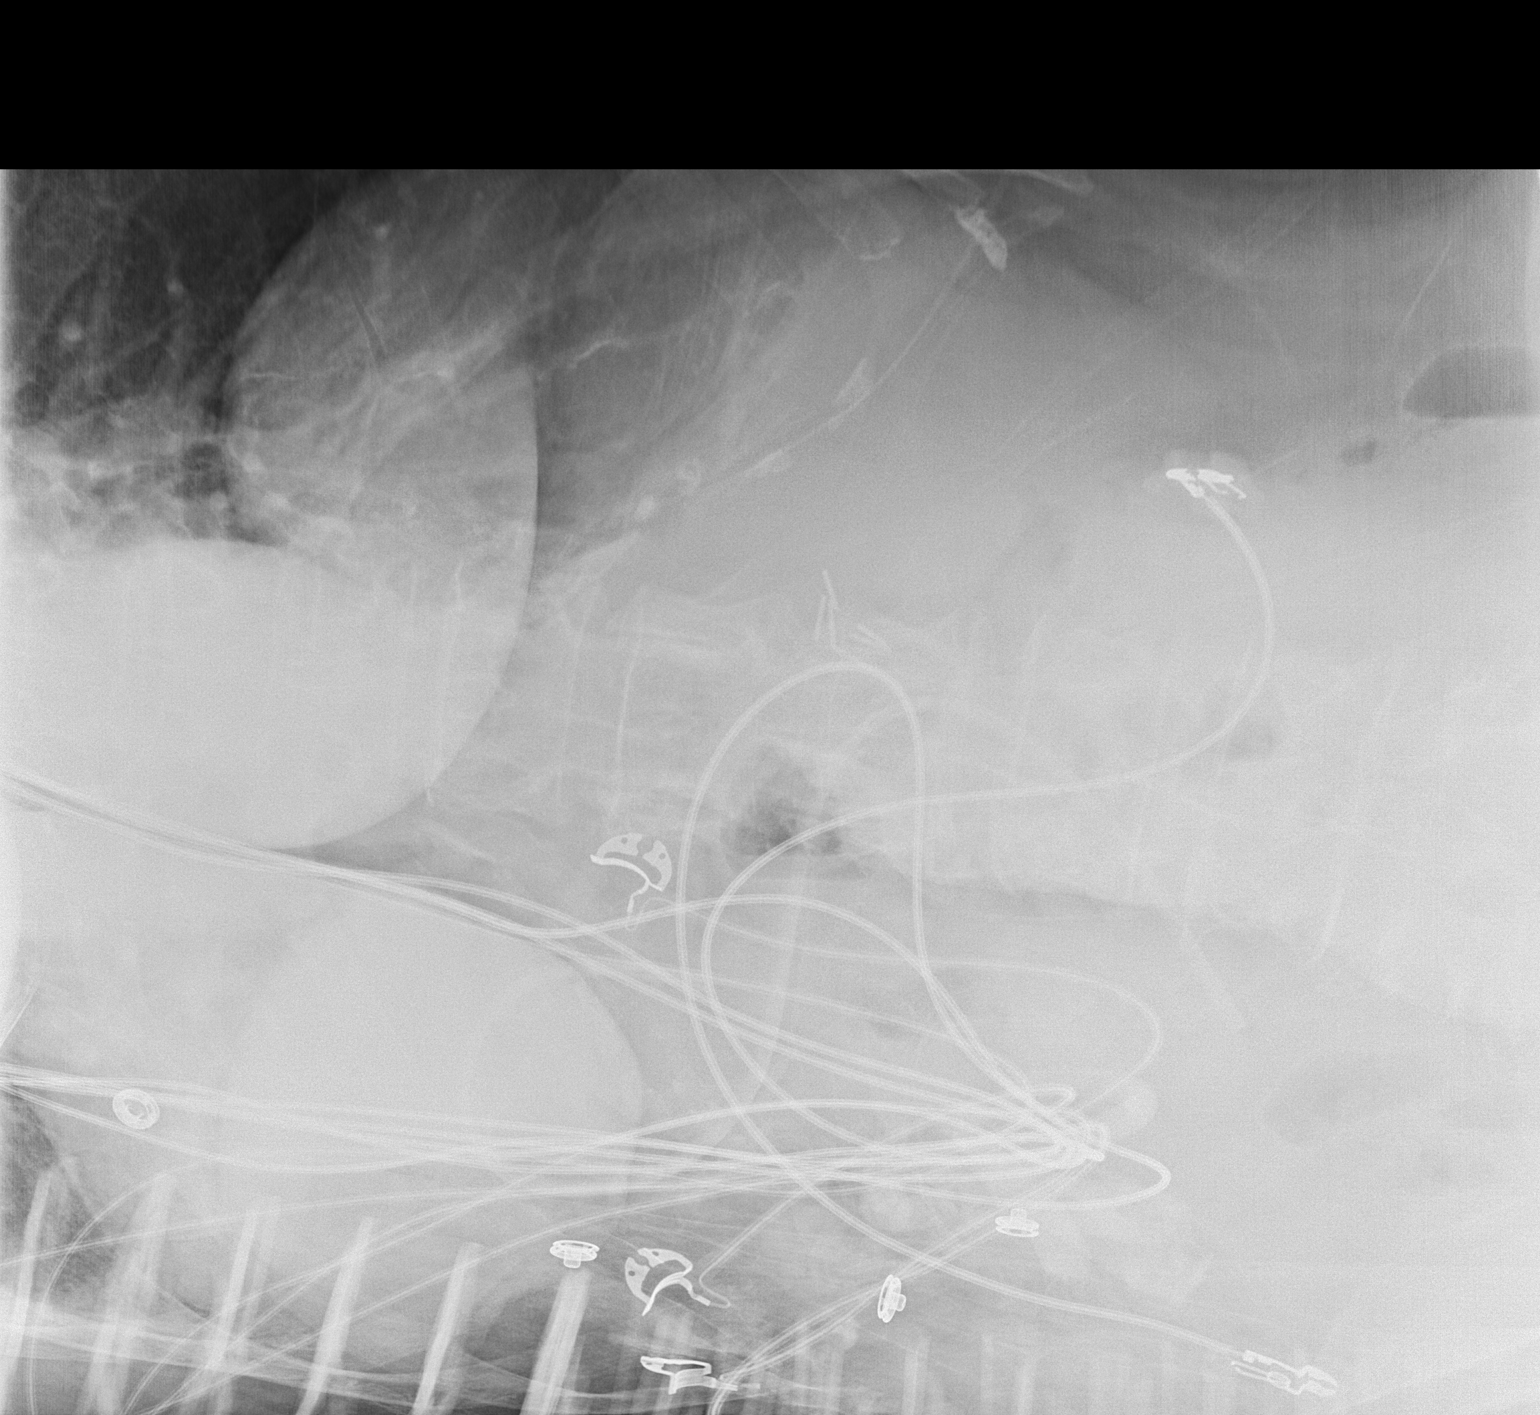

[x abdomen supine]
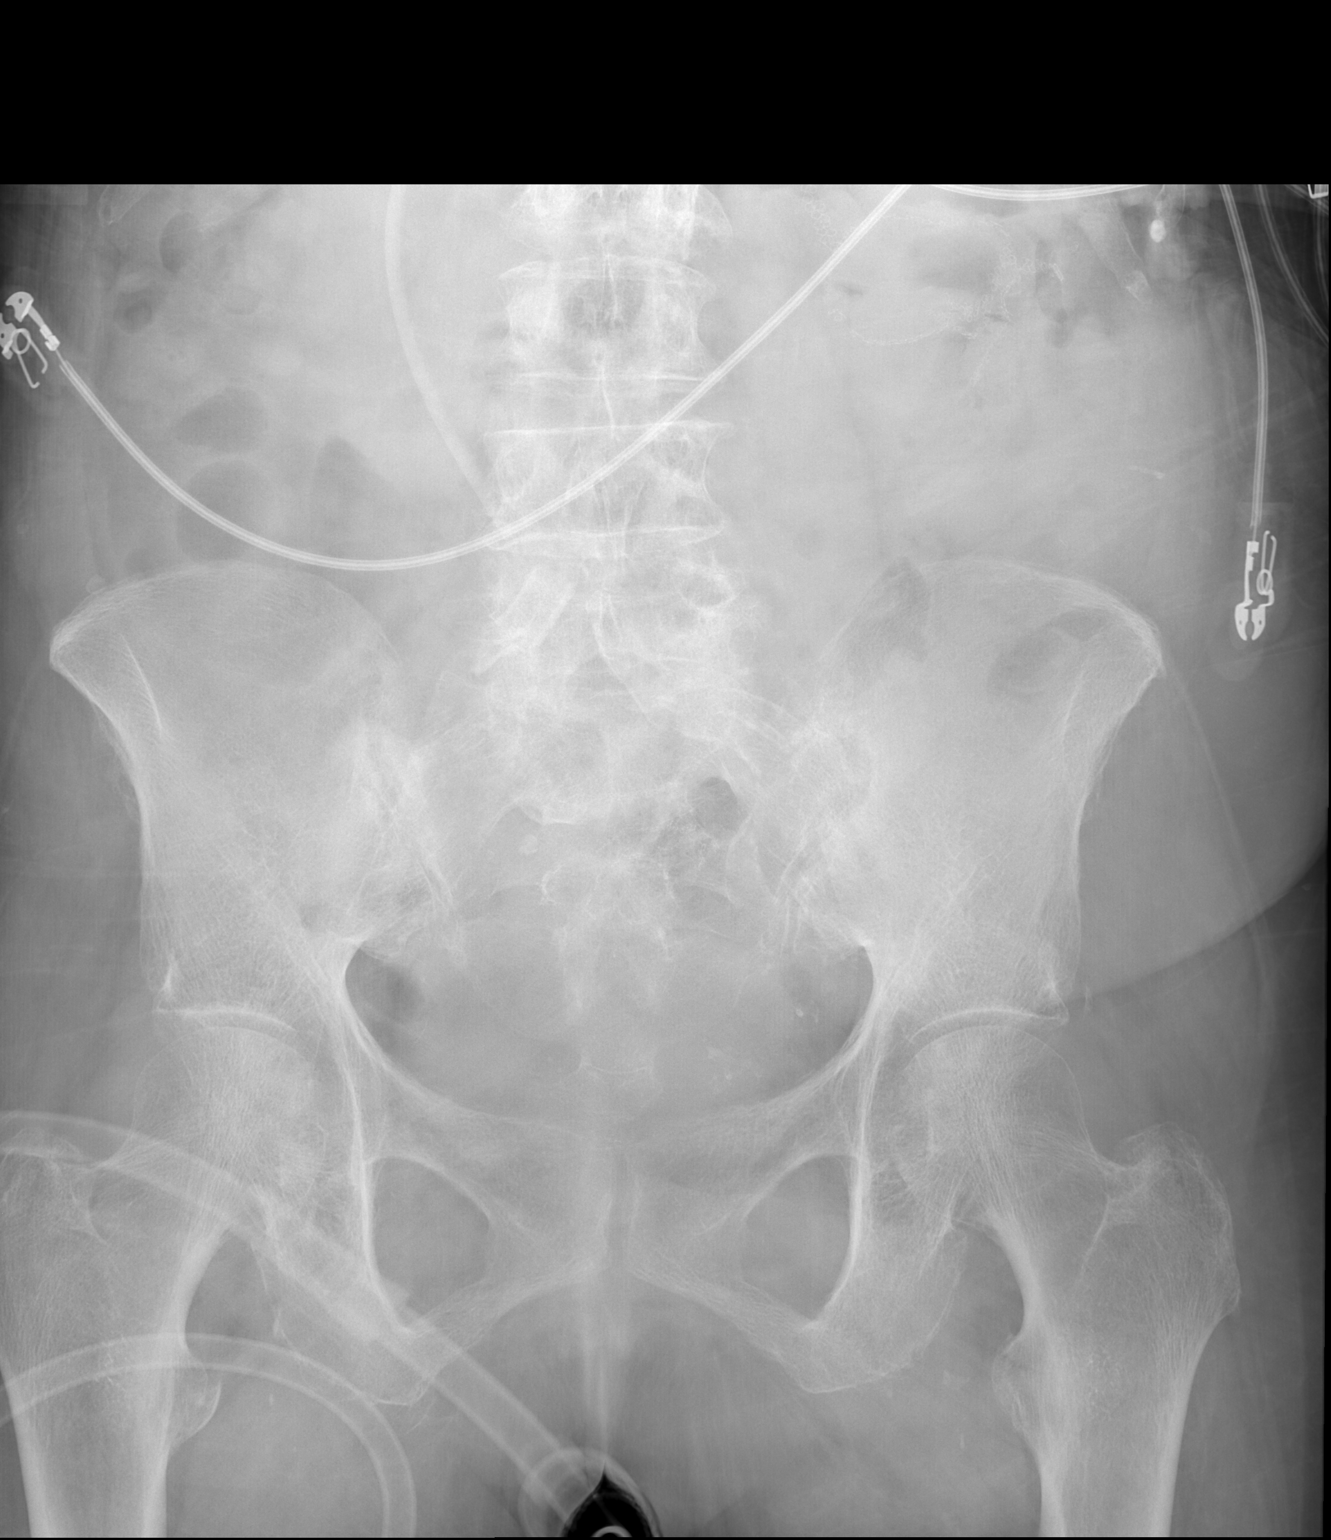

[2 of 2 positions shown; findings below may reference images not displayed]

FINDINGS: Nonobstructed bowel gas pattern. No evidence of free air on the
lateral decubitus radiograph. Evidence of prior extensive bowel
surgery in the left upper quadrant. An enteric feeding tube is
present. The tip of the tube overlies the descending duodenum.
Atherosclerotic calcifications present in the aorta and iliac
arteries. The bones appear osteopenic. No acute fracture or
malalignment.
IMPRESSION: 1. No evidence of bowel obstruction or free air.
2. The tip of a enteric feeding tube projects over the descending
duodenum.

## 2019-11-07 IMAGING — CT CT ABD-PELV W/O CM
2 of 4 series · 15 of 46 positions shown, 17 images · non-contrast
Comparison: Prior CT scan of the abdomen and pelvis 02/20/2017

CLINICAL DATA: [AGE] female with new onset chest pain

EXAM:
CT ABDOMEN AND PELVIS WITHOUT CONTRAST
TECHNIQUE: Multidetector CT imaging of the abdomen and pelvis was performed
following the standard protocol without IV contrast.

[Series 2: axial st · axial · 0.74mm/px · z∈[+1244,+1654]mm · 12 of 94 slices shown, 14 images]
[im 6/94  soft-tissue]
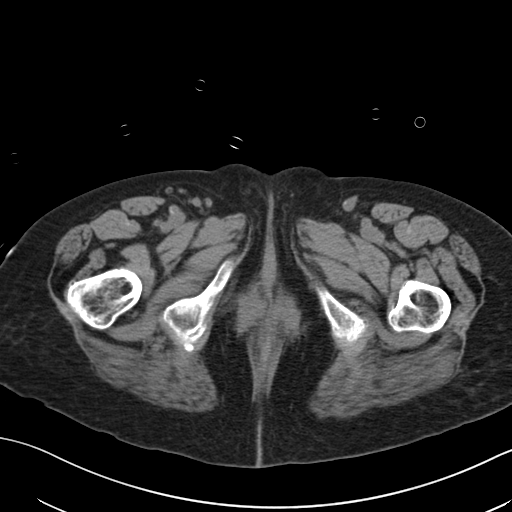
[im 6/94  bone]
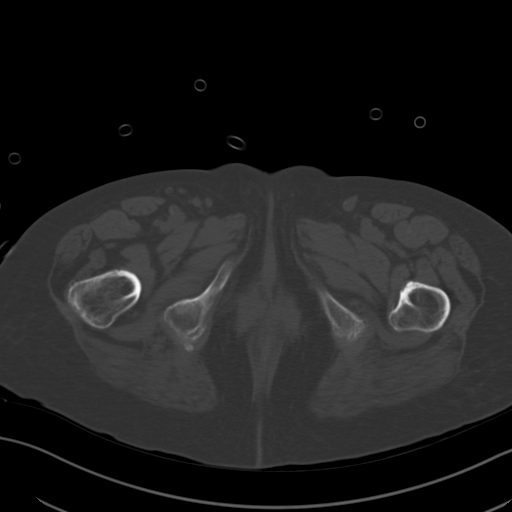
[im 17/94  soft-tissue]
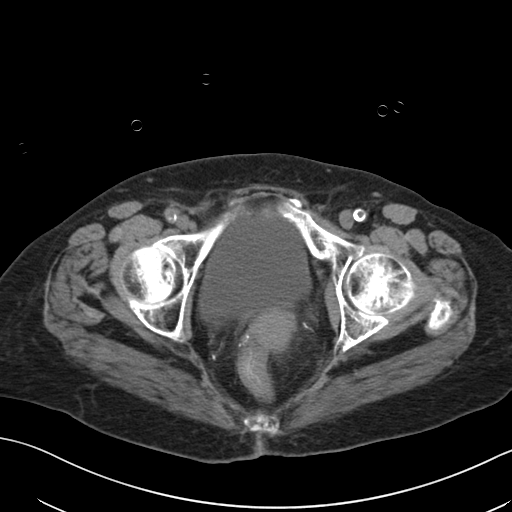
[im 22/94  soft-tissue]
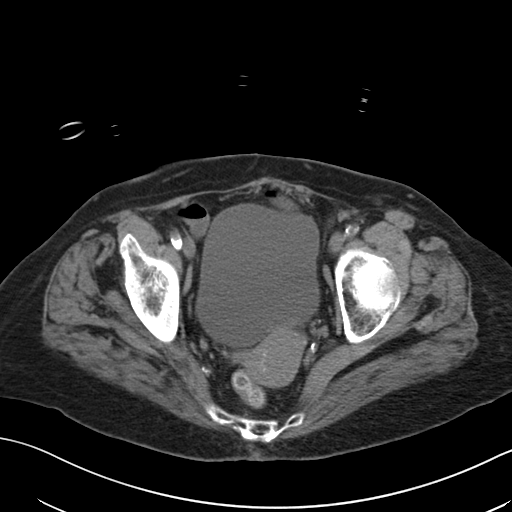
[im 28/94  soft-tissue]
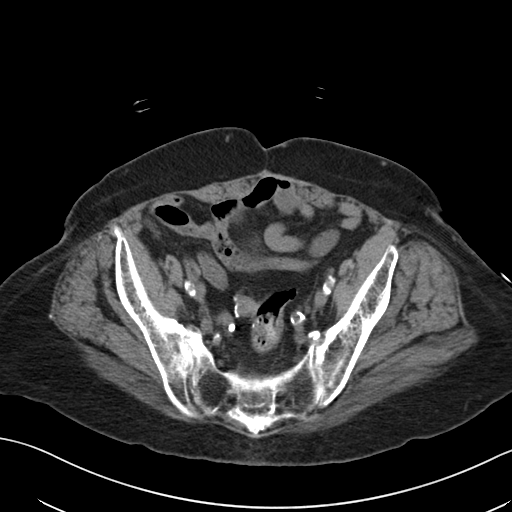
[im 39/94  soft-tissue]
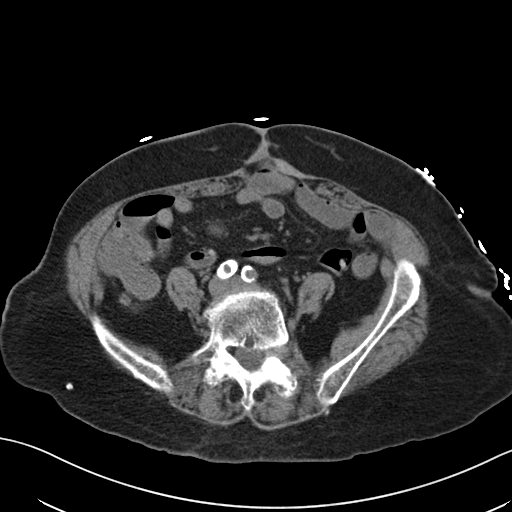
[im 44/94  soft-tissue]
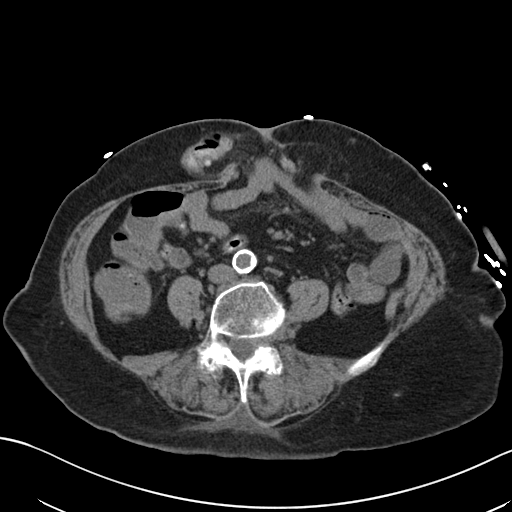
[im 50/94  soft-tissue]
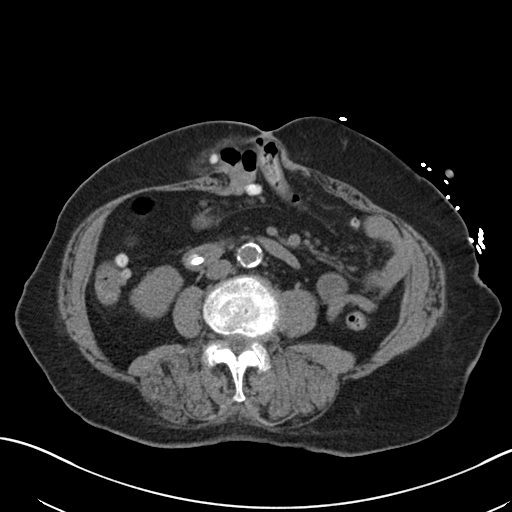
[im 61/94  soft-tissue]
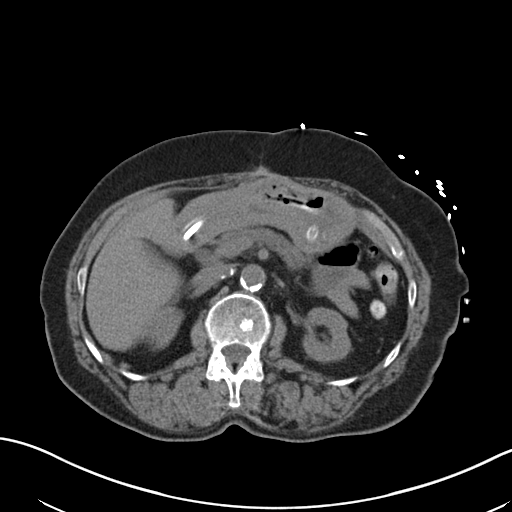
[im 66/94  soft-tissue]
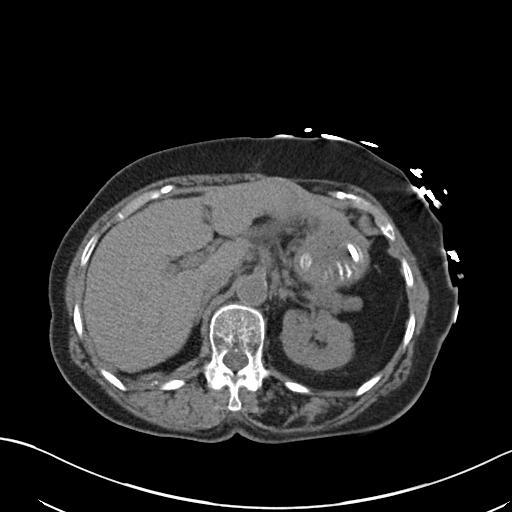
[im 66/94  bone]
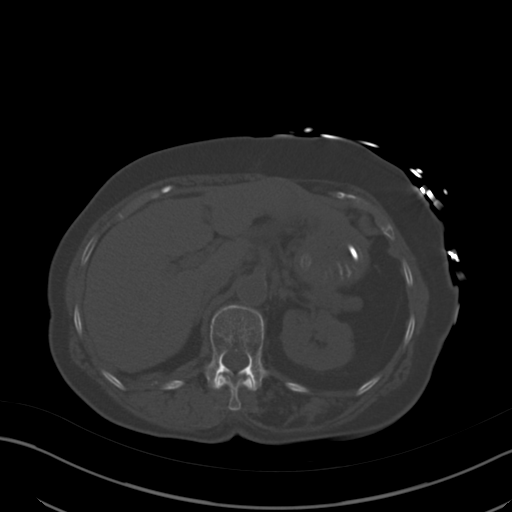
[im 72/94  soft-tissue]
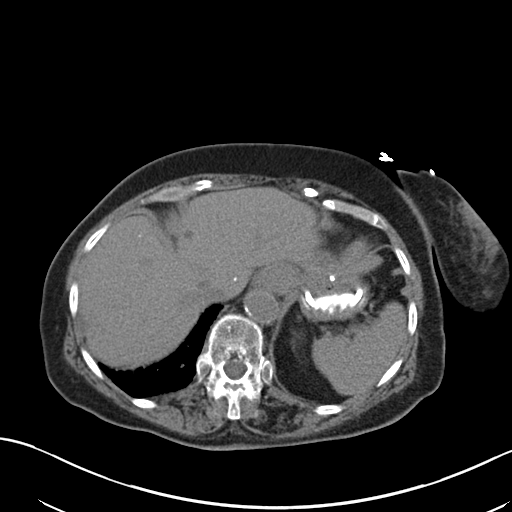
[im 83/94  soft-tissue]
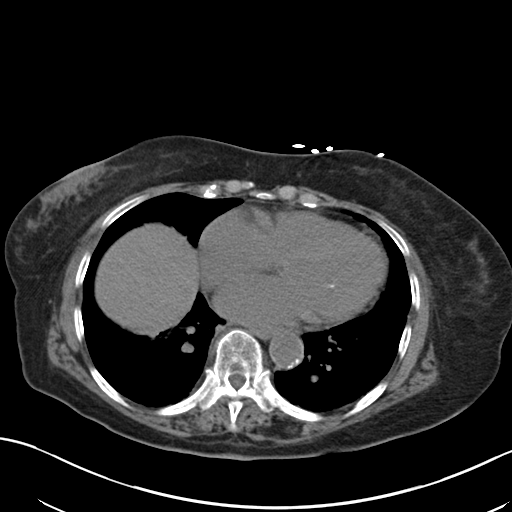
[im 88/94  soft-tissue]
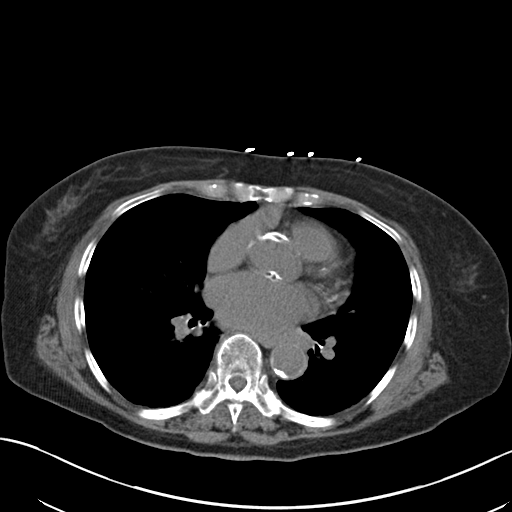

[Series 5: coronal st · coronal · 0.80mm/px · 3 of 87 slices shown]
[im 29/87  soft-tissue]
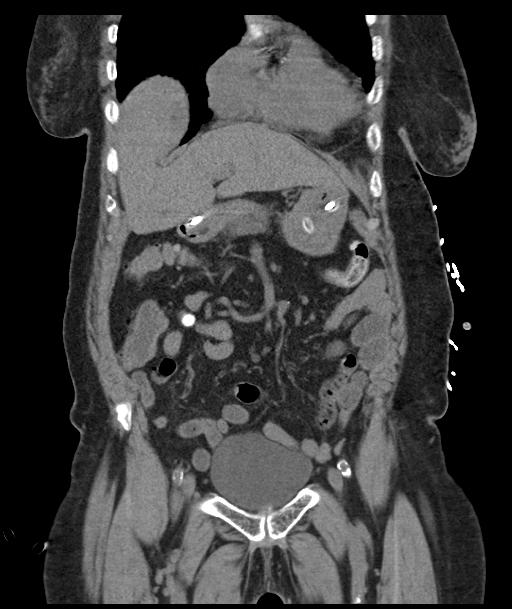
[im 39/87  soft-tissue]
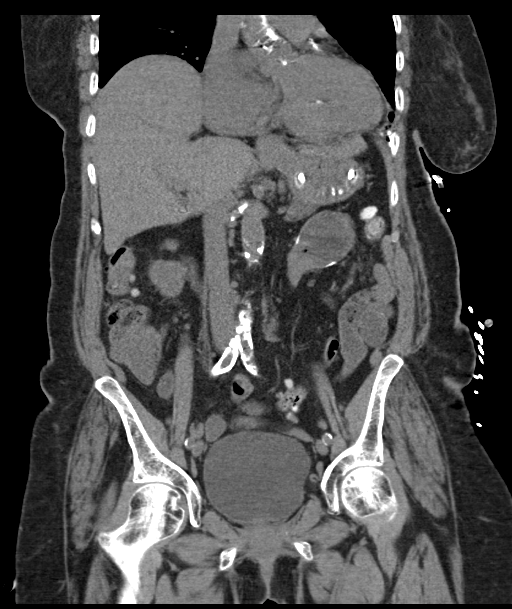
[im 48/87  soft-tissue]
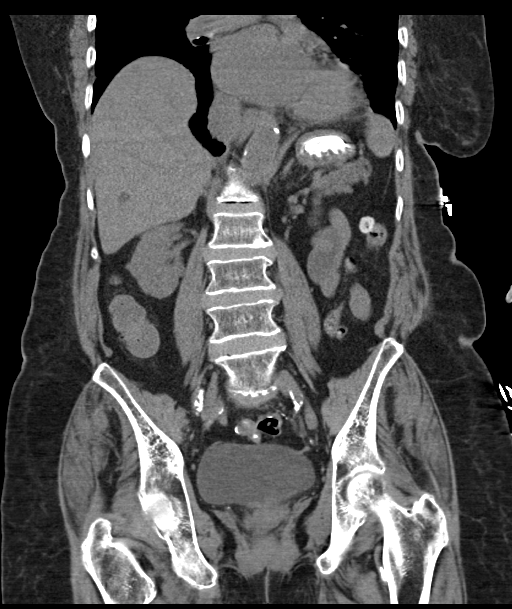

[15 of 46 positions shown; findings below may reference images not displayed]

FINDINGS: Lower chest: Stable peribronchovascular distribution of airspace
opacification in the posterior inferior left lower lobe. Likely
impaction of the supplying bronchials. There is no interval change
compared to 02/20/2017. Otherwise, the visualized lower lungs are
clear. Borderline cardiomegaly. Calcification of the inferior left
ventricular papillary muscles consistent with prior myocardial
infarction. Extensive atherosclerotic calcifications in the
visualized thoracic aorta. Calcifications also noted along the
coronary arteries. No pericardial effusion. Small hiatal hernia.

Hepatobiliary: Stable appearance of a multifocal circumscribed
low-attenuation hepatic lesions which are incompletely characterized
in the absence of intravenous contrast but remain unchanged compared
to prior imaging and are therefore favored to represent benign
cysts. Scattered punctate calcifications consistent with old
granulomatous disease. The gallbladder is surgically absent. No
intra or extrahepatic biliary ductal dilatation.

Pancreas: Unremarkable. No pancreatic ductal dilatation or
surrounding inflammatory changes.

Spleen: Normal in size without focal abnormality.

Adrenals/Urinary Tract: Unremarkable adrenal glands. No
hydronephrosis or nephrolithiasis. Ureters and bladder are
unremarkable.

Stomach/Bowel: Percutaneous gastrojejunostomy tube. The J arm
terminates in the proximal small bowel (third portion of the
duodenum). No evidence of bowel obstruction. Colonic diverticular
disease without CT evidence of active inflammation. Stable
appearance of midline ventral abdominal hernias containing colon to
the right of the umbilicus and containing herniated omental fat to
the left of the umbilicus.

Vascular/Lymphatic: Limited evaluation in the absence of intravenous
contrast. No aneurysm. Extensive atherosclerotic vascular
calcifications.

Reproductive: Uterus and bilateral adnexa are unremarkable.

Other: Abdominal wall hernias as described above. No interval change
compared to 02/20/2017. No ascites.

Musculoskeletal: No acute fracture or aggressive appearing lytic or
blastic osseous lesion. Stable T11 compression fracture with
approximately 40% height loss.
IMPRESSION: 1. No acute abnormality within the abdomen or pelvis.
2. Percutaneous gastrojejunostomy tube. The J arm extension tip is
within the proximal small bowel (third portion of the duodenum).
3. Stable right paramedian ventral abdominal hernia containing a
segment of colon without evidence of obstruction.
4. Stable left paramedian ventral abdominal hernia containing
omental fat.
5. Colonic diverticular disease without CT evidence of active
inflammation.
6. Stable chronic left lower lobe atelectasis/pleuroparenchymal
scarring without interval change compared to 02/20/2017.
7. Coronary artery calcifications.
8.  Aortic Atherosclerosis (FY361-170.0)
9. Additional ancillary findings as above without significant
interval change.

## 2019-11-07 IMAGING — CR DG CHEST 2V
2 series · 2 of 2 positions shown · non-contrast
Comparison: 09/26/2016

CLINICAL DATA: Chest tightness upon standing.

EXAM:
CHEST - 2 VIEW

[w chest lat]
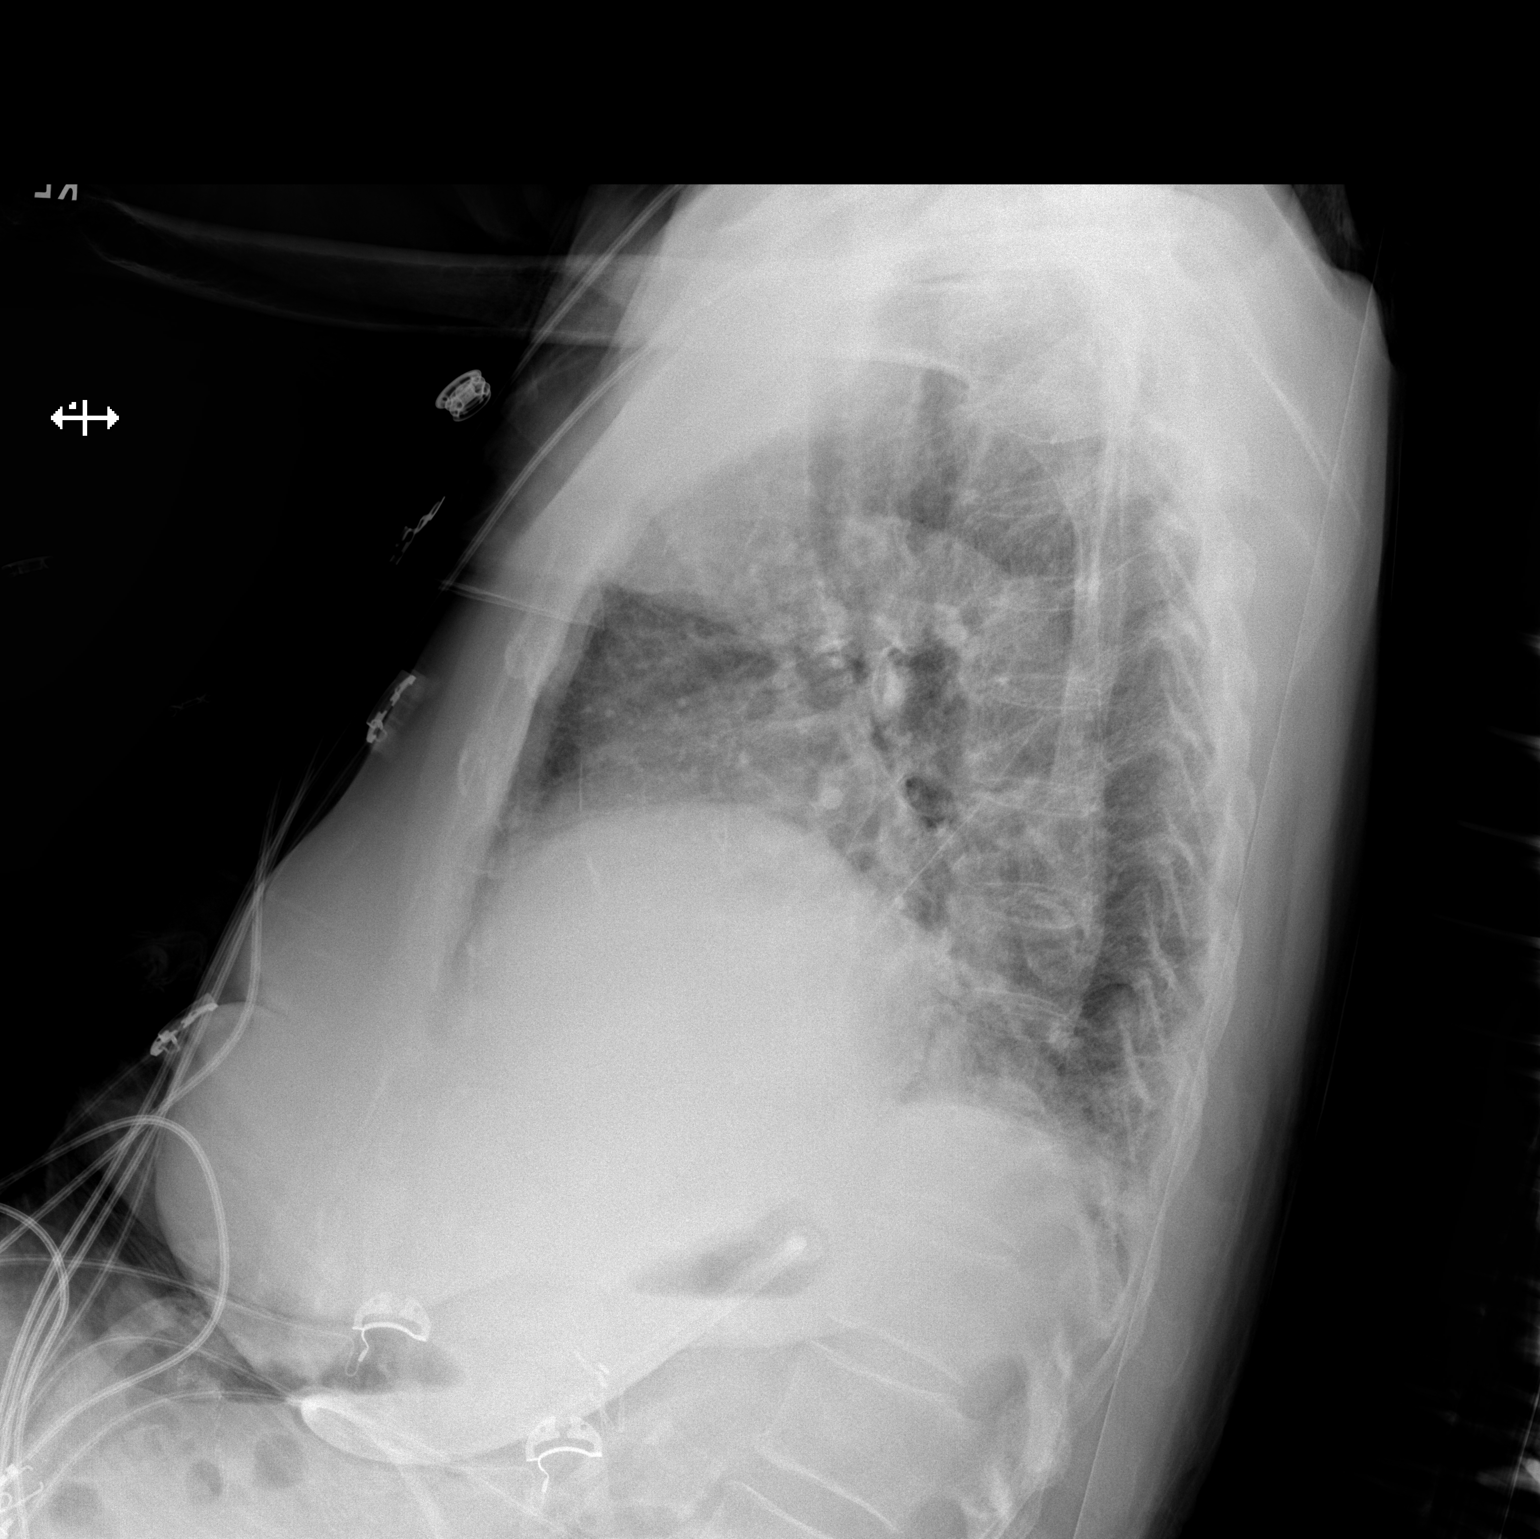

[x chest ap]
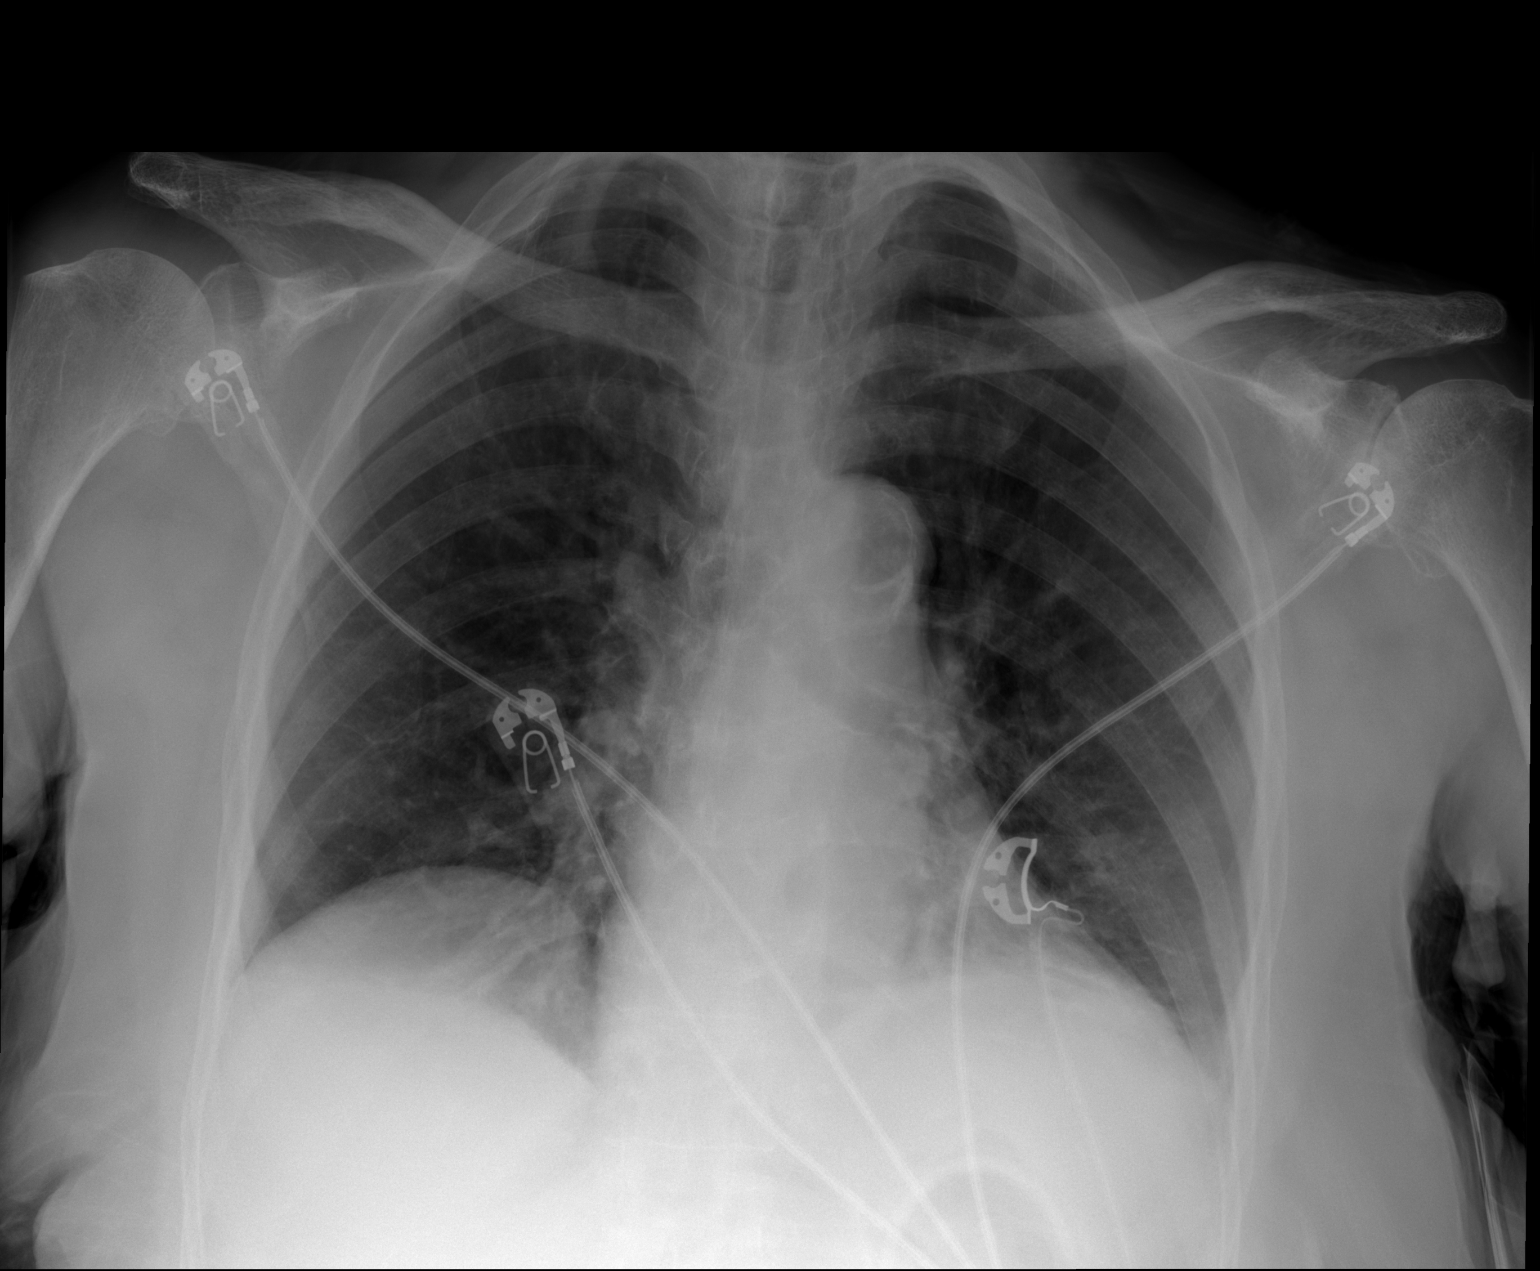

[2 of 2 positions shown; findings below may reference images not displayed]

FINDINGS: Heart and mediastinal contours within normal limits for size.
Moderate aortic atherosclerosis along the transverse portion is
noted. There is atelectasis at each lung base. Mild vascular
congestion is noted without pneumonic consolidation or pneumothorax.
No acute nor suspicious osseous abnormalities. Osteoarthritis of
both glenohumeral joints with spurring off the inferomedial aspect
of both humeral heads.
IMPRESSION: 1. Bibasilar atelectasis with mild pulmonary vascular congestion.
2. Moderate aortic atherosclerosis.

## 2019-11-07 NOTE — Progress Notes (Signed)
Provider: Malayia Spizzirri FNP-C  [name], NP  Patient Care Team: [name], NP as PCP - General (Geriatric Medicine) Dorothy Spark, MD as PCP - Cardiology (Cardiology) Edrick Kins, DPM as Consulting Physician (Podiatry) Dorothy Spark, MD as Consulting Physician (Cardiology) [name] [name], NP as Nurse Practitioner (Geriatric Medicine) Camillo Flaming, [place] as Referring Physician (Optometry) [name], RN as [facility]  Extended Emergency Contact Information Primary Emergency Contact: Armstrong,[name] Address: [address]          [place], [place] [address] [place] Litter of [place] Phone: [phone] Mobile Phone: [phone] Relation: Daughter Secondary Emergency Contact: Dicocco,John  [place] Phone: [phone] Relation: Son  Code Status: full Code  Goals of care: Advanced Directive information Advanced Directives 11/15/2019  Does Patient Have a Medical Advance Directive? Yes  Type of Advance Directive Living will  Does patient want to make changes to medical advance directive? No - Patient declined  Copy of [place] in Chart? No - copy requested  Would patient like information on creating a medical advance directive? -     Chief Complaint  Patient presents with  . Medical Clearance    Medical Clearance For Surgery to remove all toes off the Right Foot from infection. Surgery date is 11/14/2019 at [facility].     HPI:  Pt is a 86 y.o. female seen today for an acute visit for medical clearance for up coming transmetatarsal amputation of the right foot digits 2nd,3rd and 4 th due to diabetic ulcer. She has a medical history of Hypertension,chronic diastolic congestive heart Disease EF 65%,CAD,Afib on coumadin,type 2 Diabetes Mellitus with chronic Kidney disease,COPD,Osteoarthritis,SIADH,DVT on LLE, PAD,Diabetic Polyneuropathy, Hypothyroidism, PEG  tube,GERD among other conditions.she denies any new acute issues this visit.she is here with her daughter.she is on coumadin and follows up with Cardiologist will need clearance for upcoming surgical procedure.Will obtain lab work today.b/p high on arrival but rechecked after resting was normal.    Past Medical History:  Diagnosis Date  . Acute respiratory failure ([place])   . Anemia    previous blood transfusions  . Arthritis    "all over"  . Asthma   . Atrial flutter ([place])   . Bradycardia    requiring previous d/c of BB and reduction of amiodarone  . CAD (coronary artery disease)    nonobstructive per notes  . Chronic diastolic CHF (congestive heart failure) ([place])   . CKD (chronic kidney disease), stage III   . Complication of blood transfusion    "got the wrong blood type at [facility] in ~ 2015; no adverse reaction that we are aware of"/daughter, [name] (01/27/2016)  . COPD (chronic obstructive pulmonary disease) ([place])   . Depression    "light case"  . DVT (deep venous thrombosis) ([place]) 01/2016   a. LLE DVT 01/2016 - switched from Eliquis to Coumadin.  [name] Dyspnea    with some activity  . Gastric stenosis    a. s/p stomach tube  . GERD (gastroesophageal reflux disease)   . Headache   . History of 2019 novel coronavirus disease (COVID-19)   . History of blood transfusion    "several" (01/27/2016)  . History of stomach ulcers   . Hyperlipidemia   . Hypertension   . Hypothyroidism   . On home oxygen therapy    "2L; 9PM til 9AM" (06/27/2017)  . PAD (peripheral artery disease) ([place])    a. prior  gangrene, toe amputations, intervention.  [name] PAF (paroxysmal atrial fibrillation) ([place])   . Paraesophageal hernia   . Perforated gastric ulcer ([place])   . Pneumonia    "a few times" (06/27/2017)  . Seasonal allergies   . SIADH (syndrome of inappropriate ADH production) ([place])    [name] 01/10/2015  . Small bowel obstruction ([place])    "I don't know how many" (01/11/2015)  . Stroke [facility])     several- no residual  . Type II diabetes mellitus ([place])    "related to prednisone use  for > 20 yr; once predinose stopped; no more DM RX" (01/27/2016)  . UTI (urinary tract infection) 02/08/2016  . Ventral hernia with bowel obstruction    Past Surgical History:  Procedure Laterality Date  . ABDOMINAL AORTOGRAM N/A 09/21/2016   Procedure: Abdominal Aortogram;  Surgeon: Waynetta Sandy, MD;  Location: [place] [facility];  Service: Cardiovascular;  Laterality: N/A;  . AMPUTATION Left 09/25/2016   Procedure: AMPUTATION DIGIT- LEFT 2ND AND 3RD TOES;  Surgeon: Rosetta Posner, MD;  Location: [place];  Service: Vascular;  Laterality: Left;  . AMPUTATION Right 05/03/2018   Procedure: AMPUTATION RIGHT GREAT TOE;  Surgeon: Rosetta Posner, MD;  Location: [place];  Service: Vascular;  Laterality: Right;  . CATARACT EXTRACTION W/ INTRAOCULAR LENS  IMPLANT, BILATERAL    . CHOLECYSTECTOMY OPEN    . COLECTOMY    . ESOPHAGOGASTRODUODENOSCOPY N/A 01/19/2014   Procedure: ESOPHAGOGASTRODUODENOSCOPY (EGD);  Surgeon: Irene Shipper, MD;  Location: [facility];  Service: Endoscopy;  Laterality: N/A;  . ESOPHAGOGASTRODUODENOSCOPY N/A 01/20/2014   Procedure: ESOPHAGOGASTRODUODENOSCOPY (EGD);  Surgeon: Irene Shipper, MD;  Location: [facility];  Service: Endoscopy;  Laterality: N/A;  . ESOPHAGOGASTRODUODENOSCOPY N/A 03/19/2014   Procedure: ESOPHAGOGASTRODUODENOSCOPY (EGD);  Surgeon: Milus Banister, MD;  Location: [facility];  Service: Endoscopy;  Laterality: N/A;  . ESOPHAGOGASTRODUODENOSCOPY N/A 07/08/2015   Procedure: ESOPHAGOGASTRODUODENOSCOPY (EGD);  Surgeon: Doran Stabler, MD;  Location: [facility];  Service: Endoscopy;  Laterality: N/A;  . ESOPHAGOGASTRODUODENOSCOPY (EGD) WITH PROPOFOL N/A 09/15/2015   Procedure: ESOPHAGOGASTRODUODENOSCOPY (EGD) WITH PROPOFOL;  Surgeon: Manus Gunning, MD;  [facility];  Service: Gastroenterology;  Laterality: N/A;  . GASTROJEJUNOSTOMY     hx/notes  01/10/2015  . GASTROJEJUNOSTOMY N/A 09/23/2015   Procedure: OPEN GASTROJEJUNOSTOMY TUBE PLACEMENT;  Surgeon: Arta Bruce Kinsinger, MD;  [facility];  Service: General;  Laterality: N/A;  . GLAUCOMA SURGERY Bilateral   . HERNIA REPAIR  2015  . IR CM INJ ANY COLONIC TUBE W/FLUORO  01/05/2017  . IR CM INJ ANY COLONIC TUBE W/FLUORO  06/07/2017  . IR CM INJ ANY COLONIC TUBE W/FLUORO  11/05/2017  . IR CM INJ ANY COLONIC TUBE W/FLUORO  09/18/2018  . IR CM INJ ANY COLONIC TUBE W/FLUORO  03/06/2019  . IR CM INJ ANY COLONIC TUBE W/FLUORO  03/11/2019  . IR GENERIC HISTORICAL  01/07/2016   IR GJ TUBE CHANGE 01/07/2016 Jacqulynn Cadet, MD [facility]  . IR GENERIC HISTORICAL  01/27/2016   IR MECH REMOV OBSTRUC MAT ANY COLON TUBE W/FLUORO 01/27/2016 Markus Daft, MD [facility]  . IR GENERIC HISTORICAL  02/07/2016   IR PATIENT EVAL TECH 0-60 MINS Darrell K Allred, PA-C [facility]  . IR GENERIC HISTORICAL  02/08/2016   IR GJ TUBE CHANGE 02/08/2016 Greggory Keen, MD [facility]  . IR GENERIC HISTORICAL  01/06/2016   IR GJ TUBE CHANGE 01/06/2016 [facility] OUT REF  . IR GENERIC HISTORICAL  05/02/2016  IR CM INJ ANY COLONIC TUBE W/FLUORO 05/02/2016 Arne Cleveland, MD [facility]  . IR GENERIC HISTORICAL  05/15/2016   IR GJ TUBE CHANGE 05/15/2016 Sandi Mariscal, MD [facility]  . IR GENERIC HISTORICAL  06/28/2016   IR GJ TUBE CHANGE 06/28/2016 [facility]  . IR GJ TUBE CHANGE  02/20/2017  . IR GJ TUBE CHANGE  05/10/2017  . IR GJ TUBE CHANGE  07/06/2017  . IR GJ TUBE CHANGE  08/02/2017  . IR GJ TUBE CHANGE  10/15/2017  . IR GJ TUBE CHANGE  12/26/2017  . IR [place] TUBE CHANGE  04/08/2018  . IR [place] TUBE CHANGE  06/19/2018  . IR [place] TUBE CHANGE  03/03/2019  . IR [place] TUBE CHANGE  06/06/2019  . IR GJ TUBE CHANGE  09/26/2019  . IR PATIENT EVAL TECH 0-60 MINS  10/19/2016  . IR PATIENT EVAL TECH 0-60 MINS  12/25/2016  . IR PATIENT EVAL TECH 0-60 MINS  01/29/2017  . IR PATIENT EVAL TECH 0-60 MINS  04/04/2017  . IR PATIENT EVAL TECH 0-60  MINS  04/30/2017  . IR PATIENT EVAL TECH 0-60 MINS  07/31/2017  . IR REPLC DUODEN/JEJUNO TUBE PERCUT W/FLUORO  11/14/2016  . LAPAROTOMY N/A 01/20/2015   Procedure: EXPLORATORY LAPAROTOMY;  Surgeon: Coralie Keens, MD;  Location: [place];  Service: General;  Laterality: N/A;  . LOWER EXTREMITY ANGIOGRAPHY Left 09/21/2016   Procedure: Lower Extremity Angiography;  Surgeon: Waynetta Sandy, MD;  Location: [place] [facility];  Service: Cardiovascular;  Laterality: Left;  . LOWER EXTREMITY ANGIOGRAPHY Right 06/27/2017   Procedure: Lower Extremity Angiography;  Surgeon: Waynetta Sandy, MD;  Location: [place] [facility];  Service: Cardiovascular;  Laterality: Right;  . LYSIS OF ADHESION N/A 01/20/2015   Procedure: LYSIS OF ADHESIONS < 1 HOUR;  Surgeon: Coralie Keens, MD;  Location: [place];  Service: General;  Laterality: N/A;  . PERIPHERAL VASCULAR BALLOON ANGIOPLASTY Left 09/21/2016   Procedure: Peripheral Vascular Balloon Angioplasty;  Surgeon: Waynetta Sandy, MD;  Location: [place] [facility];  Service: Cardiovascular;  Laterality: Left;  drug coated balloon  . PERIPHERAL VASCULAR BALLOON ANGIOPLASTY Right 06/27/2017   Procedure: PERIPHERAL VASCULAR BALLOON ANGIOPLASTY;  Surgeon: Waynetta Sandy, MD;  Location: [place] [facility];  Service: Cardiovascular;  Laterality: Right;  SFA/[id number]  . TONSILLECTOMY    . TRANSMETATARSAL AMPUTATION Right 11/14/2019   Procedure: TRANSMETATARSAL AMPUTATION RIGHT FOOT;  Surgeon: Edrick Kins, DPM;  Location: [place];  Service: Podiatry;  Laterality: Right;  . TUBAL LIGATION    . VENTRAL HERNIA REPAIR  2015   incarcerated ventral hernia ([facility] 09/2013)/notes 01/10/2015    Allergies  Allergen Reactions  . Penicillins Itching, Rash and Other (See Comments)    Tolerated amoxicillin, unasyn, zosyn & cephalosporins in the past. Did it involve swelling of the face/tongue/throat, SOB, or low BP? No Did it involve sudden or severe  rash/hives, skin peeling, or any reaction on the inside of your mouth or nose? No Did you need to seek medical attention at a hospital or doctor's office? Unknown When did it last happen?5+ years If all above answers are "NO", may proceed with cephalosporin use.     Outpatient Encounter Medications as of 11/07/2019  Medication Sig  . acetaminophen (TYLENOL) 500 MG tablet Take 500-1,000 mg by mouth every 6 (six) hours as needed (pain).   . Amino Acids-Protein Hydrolys (FEEDING SUPPLEMENT, PRO-STAT SUGAR FREE 64,) LIQD Take 30 mLs by mouth 2 (two) times daily.  [name]  amLODipine (NORVASC) 5 MG tablet Take 1 tablet (5 mg total) by mouth daily.  [name] atorvastatin (LIPITOR) 10 MG tablet TAKE 1 TABLET BY MOUTH EVERY DAY  . b complex vitamins tablet Take 1 tablet by mouth daily.   . benzocaine (AMERICAINE) 20 % rectal ointment Place rectally every 3 (three) hours as needed for pain.  . calcium-vitamin D (OSCAL WITH D) 500-200 MG-UNIT per tablet Take 1 tablet by mouth daily with breakfast.   . Carboxymethylcellulose Sodium (ARTIFICIAL TEARS OP) Place 1 drop into both eyes 4 (four) times daily.  . cycloSPORINE (RESTASIS) 0.05 % ophthalmic emulsion USE 1 DROP INTO BOTH EYES TWICE DAILY  . DULERA 200-5 MCG/ACT AERO TAKE TWO PUFFS BY MOUTH TWICE DAILY  . ferrous sulfate 220 (44 Fe) MG/5ML solution TAKE 7.4 MLS (325 MG TOTAL) BY MOUTH DAILY.  . fluticasone (FLONASE) 50 MCG/ACT nasal spray SPRAY 2 SPRAYS INTO EACH NOSTRIL EVERY DAY  . furosemide (LASIX) 20 MG tablet Take 1 tablet (20 mg total) by mouth daily.  . furosemide (LASIX) 40 MG tablet Take 20-40 mg by mouth See admin instructions. Take 20 mg daily, may instead take 40 mg daily as needed for edema  . gabapentin (NEURONTIN) 250 MG/5ML solution TAKE 6 MLS (300 MG TOTAL) BY MOUTH 2 (TWO) TIMES DAILY.  [name] gentamicin cream (GARAMYCIN) 0.1 % Apply 1 application topically daily as needed (dry skin).  . hydrALAZINE (APRESOLINE) 50 MG tablet TAKE 1 TABLET BY  MOUTH THREE TIMES A DAY  . insulin aspart (NOVOLOG) 100 UNIT/ML FlexPen Inject into the skin as needed for high blood sugar. New sliding Scale If BS is 151 - 251 = 2 units,  If BS is 251 -  350 = 4 units,  If BS is 351 - 450 = 6 units Call provider if BS runs more than 400  . Insulin Glargine (LANTUS SOLOSTAR) 100 UNIT/ML Solostar Pen Inject 8 Units into the skin at bedtime.  . Insulin Pen Needle (B-D UF III MINI PEN NEEDLES) 31G X 5 MM MISC Use twice daily with giving insulin injections. Dx E11.9  . ipratropium-albuterol (DUONEB) 0.5-2.5 (3) MG/3ML SOLN Take 3 mLs by nebulization every 6 (six) hours as needed (shortness of breath).  . isosorbide mononitrate (IMDUR) 30 MG 24 hr tablet TAKE 2 TABLETS BY MOUTH DAILY. PLEASE MAKE ANNUAL APPT WITH DR. Meda Coffee FOR FUTURE REFILLS  . latanoprost (XALATAN) 0.005 % ophthalmic solution Place 1 drop into both eyes at bedtime.  . levalbuterol (XOPENEX HFA) 45 MCG/ACT inhaler Inhale 2 puffs into the lungs every 6 (six) hours as needed for wheezing.  [name] levothyroxine (SYNTHROID) 125 MCG tablet TAKE 1 TABLET BY MOUTH EVERY DAY  . loratadine (CLARITIN) 10 MG tablet Take 10 mg by mouth daily as needed for allergies.  . Multiple Vitamin (MULTIVITAMIN WITH MINERALS) TABS tablet Take 1 tablet by mouth daily.  . Nutritional Supplements (FEEDING SUPPLEMENT, OSMOLITE 1.5 CAL,) LIQD Place 711 mLs into feeding tube See admin instructions. Take 711 ml over a 12 hour period per day  . nystatin (MYCOSTATIN/NYSTOP) powder Apply 1 application topically as needed (skin irritation).  [name] oxyCODONE (ROXICODONE) 5 MG immediate release tablet Take 1 tablet (5 mg total) by mouth every 4 (four) hours as needed for severe pain. May take 1/2 tab q4hrs as needed for mild pain.  . pantoprazole (PROTONIX) 40 MG tablet TAKE 1 TABLET BY MOUTH EVERY DAY  . polyethylene glycol (MIRALAX / GLYCOLAX) 17 g packet Take 17 g by mouth as needed.  [name]  Probiotic Product (EQL PROBIOTIC COLON SUPPORT PO) Take 1  capsule by mouth daily.  [name] senna-docusate (SENOKOT-S) 8.6-50 MG tablet Take 1 tablet by mouth as needed for mild constipation.  . sertraline (ZOLOFT) 50 MG tablet Take 50 mg by mouth daily. Pt currently taking 50 mg of liquid, family noticed changes when the original 100 mg dose was reduced to 50. Family requested pt go back to 100 mg. Pt will continue to take 50 mg qd of the liquid until she runs out then switch to the 100 mg tablets.  . Water For Irrigation, Sterile (FREE WATER) SOLN Place 180 mLs into feeding tube 3 (three) times daily.   Dema Severin Petrolatum-Mineral Oil (SYSTANE NIGHTTIME) OINT Place 1 application into both eyes at bedtime.   . [DISCONTINUED] sertraline (ZOLOFT) 20 MG/ML concentrated solution Take 50 mg by mouth daily.  . [DISCONTINUED] warfarin (COUMADIN) 2.5 MG tablet Take 5-7.5 mg by mouth See admin instructions. Take 7.5 mg in the evening on Sun and Thurs, take 5 mg in the evening on Mon, Tue, Wed, Fri, and Sat  . [DISCONTINUED] doxycycline (VIBRA-TABS) 100 MG tablet Take 1 tablet (100 mg total) by mouth 2 (two) times daily. (Patient not taking: Reported on 11/06/2019)  . [DISCONTINUED] insulin aspart (NOVOLOG) 100 UNIT/ML FlexPen Inject 0-6 Units into the skin 3 (three) times daily with meals. New sliding Scale If BS is 151 - 251 = 2 units,  If BS is 251 -  350 = 4 units,  If BS is 351 - 450 = 6 units Call provider if BS runs more than 400 (Patient taking differently: Inject 0-6 Units into the skin as needed. )  . [DISCONTINUED] polyethylene glycol (MIRALAX / GLYCOLAX) 17 g packet Take 17 g by mouth daily. (Patient taking differently: Take 17 g by mouth as needed. )  . [DISCONTINUED] senna-docusate (SENOKOT-S) 8.6-50 MG tablet Take 1 tablet by mouth at bedtime as needed for mild constipation or moderate constipation. (Patient taking differently: Take 1 tablet by mouth as needed for mild constipation or moderate constipation. )  . [DISCONTINUED] sertraline (ZOLOFT) 50 MG tablet Take 2   tabs = 113m orally daily (Patient taking differently: Take 100 mg by mouth daily. )   No facility-administered encounter medications on file as of 11/07/2019.    Review of Systems  Constitutional: Negative for appetite change, chills, fatigue and fever.  HENT: Positive for hearing loss. Negative for congestion, rhinorrhea, sinus pressure, sinus pain, sneezing and sore throat.   Eyes: Negative for pain, discharge, redness and itching.  Respiratory: Negative for cough, chest tightness, shortness of breath and wheezing.   Cardiovascular: Negative for chest pain and leg swelling.       Afib on coumadin   Gastrointestinal: Negative for abdominal distention, abdominal pain, constipation, diarrhea, nausea and vomiting.  Endocrine: Negative for cold intolerance, heat intolerance, polydipsia, polyphagia and polyuria.  Genitourinary: Negative for difficulty urinating, dysuria, flank pain and urgency.  Musculoskeletal: Positive for arthralgias and gait problem. Negative for myalgias and neck pain.       Chronic left shoulder pain   Skin: Negative for color change, pallor and rash.  Neurological: Negative for dizziness, speech difficulty, light-headedness and headaches.  Hematological: Does not bruise/bleed easily.  Psychiatric/Behavioral: Negative for agitation, confusion and sleep disturbance. The patient is not nervous/anxious.     Immunization History  Administered Date(s) Administered  . Fluad Quad(high Dose 65+) 12/02/2018  . Influenza, High Dose Seasonal PF 01/19/2017  . Influenza,inj,Quad PF,6+ Mos 12/24/2013, 01/27/2015, 12/29/2015,  12/04/2017  . Influenza-Unspecified 12/02/2012  . Janssen (J&J) SARS-COV-2 Vaccination 07/11/2019  . Pneumococcal Conjugate-13 02/03/2014  . Pneumococcal Polysaccharide-23 08/31/2016  . Tdap 07/18/2016  . Zoster 04/12/2011   Pertinent  Health Maintenance Due  Topic Date Due  . INFLUENZA VACCINE  11/02/2019  . OPHTHALMOLOGY EXAM  12/16/2019  .  HEMOGLOBIN A1C  04/24/2020  . FOOT EXAM  10/19/2020  . DEXA SCAN  Completed  . PNA vac Low Risk Adult  Completed   Fall Risk  11/07/2019 10/21/2019 10/20/2019 09/11/2019 07/21/2019  Falls in the past year? 0 0 0 0 0  Number falls in past yr: 0 0 0 0 0  Injury with Fall? 0 0 - 0 0  Risk for fall due to : - - - - -  Follow up - - - - -    Vitals:   11/07/19 1306 11/07/19 1400  BP: (!) 160/60 140/70  Pulse: 71   Resp: 16   Temp: 97.7 F (36.5 C)   SpO2: 97%   Weight: 133 lb 12.8 oz (60.7 kg)   Height: _0  (1.676 m)    Body mass index is 21.6 kg/m. Physical Exam Vitals reviewed.  Constitutional:      General: She is not in acute distress.    Appearance: She is not ill-appearing.  HENT:     Head: Normocephalic.     Right Ear: Tympanic membrane, ear canal and external ear normal. There is no impacted cerumen.     Left Ear: Tympanic membrane, ear canal and external ear normal. There is no impacted cerumen.     Nose: Nose normal. No congestion or rhinorrhea.     Mouth/Throat:     Mouth: Mucous membranes are moist.     Pharynx: Oropharynx is clear. No oropharyngeal exudate or posterior oropharyngeal erythema.     Comments: Missing all teeth  Eyes:     General: No scleral icterus.       Right eye: No discharge.        Left eye: No discharge.     Conjunctiva/sclera: Conjunctivae normal.     Pupils: Pupils are equal, round, and reactive to light.  Cardiovascular:     Rate and Rhythm: Normal rate and regular rhythm.     Pulses: Normal pulses.     Heart sounds: Normal heart sounds. No murmur heard.  No friction rub. No gallop.   Pulmonary:     Effort: Pulmonary effort is normal. No respiratory distress.     Breath sounds: Normal breath sounds. No wheezing, rhonchi or rales.  Chest:     Chest wall: No tenderness.  Abdominal:     General: Bowel sounds are normal. There is no distension.     Palpations: Abdomen is soft. There is no mass.     Tenderness: There is no abdominal  tenderness. There is no right CVA tenderness, left CVA tenderness, guarding or rebound.     Comments: PEG tube in place skin intact   Musculoskeletal:        General: No swelling or tenderness.     Cervical back: Normal range of motion. No rigidity or tenderness.     Right lower leg: No edema.     Left lower leg: No edema.     Comments: Unsteady gait walks with Rolator   Lymphadenopathy:     Cervical: No cervical adenopathy.  Skin:    General: Skin is warm.     Coloration: Skin is not pale.  Findings: No bruising, erythema or rash.  Neurological:     Mental Status: She is alert and oriented to person, place, and time.     Motor: No weakness.     Coordination: Coordination normal.     Gait: Gait abnormal.     Comments: HOH   Psychiatric:        Mood and Affect: Mood normal.        Speech: Speech normal.        Behavior: Behavior normal.        Thought Content: Thought content normal.        Judgment: Judgment normal.    Labs reviewed: Recent Labs    05/18/19 1925 05/18/19 2355 08/27/19 2016 08/28/19 0101 09/24/19 2000 11/07/19 1454 11/15/19 0506  NA 134*   < >  --    < > 136 139 134*  K 4.3   < >  --    < > 4.6 4.4 4.3  CL 101   < >  --    < > 103 103 100  CO2 24   < >  --    < > _0 GLUCOSE 80   < >  --    < > 98 76 162*  BUN 60*   < >  --    < > 49* 52* 37*  CREATININE 1.75*   < >  --    < > 1.33* 1.41* 1.37*  CALCIUM 9.4   < >  --    < > 9.0 9.5 9.3  MG 2.2  --  2.3  --   --   --  2.1  PHOS  --   --   --   --   --   --  4.4   < > = values in this interval not displayed.   Recent Labs    08/15/19 0519 08/15/19 0519 09/24/19 2000 11/07/19 1454 11/15/19 0506  AST 25   < > _1 ALT 19   < > _2 ALKPHOS 70  --  75  --  78  BILITOT 0.1*   < > 0.7 0.4 0.7  PROT 7.0   < > 7.6 7.5 7.0  ALBUMIN 3.4*  --  3.4*  --  3.0*   < > = values in this interval not displayed.   Recent Labs    09/24/19 2000 11/07/19 1454 11/15/19 0506  WBC 6.7 5.1  4.9  NEUTROABS 4.3 2,917 4.4  HGB 9.1* 9.7* 9.3*  HCT 26.8* 29.9* 27.7*  MCV 91.5 91.7 90.2  PLT 240 223 193   Lab Results  Component Value Date   TSH 1.428 08/28/2019   Lab Results  Component Value Date   HGBA1C 6.3 (H) 10/23/2019   Lab Results  Component Value Date   CHOL 131 10/15/2018   HDL 47 (L) 10/15/2018   LDLCALC 58 10/15/2018   TRIG 188 (H) 10/15/2018   CHOLHDL 2.8 10/15/2018    Significant Diagnostic Results in last 30 days:  DG Foot Complete Right  Result Date: 11/19/2019 Please see detailed radiograph report in office note.  DG Foot Complete Right  Result Date: 11/14/2019 CLINICAL DATA:  Transmetatarsal amputation EXAM: RIGHT FOOT COMPLETE - 3+ VIEW COMPARISON:  10/20/2019 FINDINGS: Transmetatarsal amputation of the right foot. No additional bony abnormality. Soft tissue gas noted at the stump, likely postoperative. IMPRESSION: Transmetatarsal amputation.  No visible complicating feature. Electronically Signed   By: Rolm Baptise  M.D.   On: 11/14/2019 17:43    Assessment/Plan 1. Pre-operative clearance Requires medical clearance for up coming transmetatarsal amputation of the right foot digits 2nd,3rd and 4 th due to diabetic ulcer. - latest EKG on chart 09/10/2019 indicates NSR HR 66 b/min   - CBC with Differential/Platelet - CMP with eGFR([facility]) - Advised to get labs drawn at [facility] on [address] lab tech off today at [facility]  2. Atrial fibrillation ([place]) Controlled on coumadin managed by cardiologist.will need clearance from cardiologist to hold coumadin.  3. Chronic diastolic CHF (congestive heart failure) (HCC) No signs of fluid overload. - continue on furosemide and imdur  - continue to monitor weight   4. Type 2 diabetes mellitus with peripheral neuropathy (HCC) Lab Results  Component Value Date   HGBA1C 6.3 (H) 10/23/2019  CBG controlled. - continue on lantus and Novolog  5. Anemia of chronic disease Chronic latest Hgb  9.1  Continue on ferrous sulfate - CBC/diff    Family/ staff Communication: Reviewed plan of care with patient and daughter cleared medically will need clearance from cardiologist for holding coumadin.  Labs/tests ordered: - CBC with Differential/Platelet - CMP with eGFR([facility])  Next Appointment: will schedule appointment with PCP Dani Gobble post-op   Sandrea Hughs, NP

## 2019-11-07 NOTE — Telephone Encounter (Signed)
Shannon [name]  I agree that would avoid lovenox bridge given her advanced age and significantly reduced CrCl.  She has been maintaining NSR for some time and therefore would just hold Coumadin.

## 2019-11-07 NOTE — Patient Instructions (Addendum)
-   Latest Hgb 9.1 labs order today will call you with results.  Please get labs drawn at [facility] on [address]

## 2019-11-08 LAB — CBC WITH DIFFERENTIAL/PLATELET
Absolute Monocytes: 694 cells/uL (ref 200–950)
Basophils Absolute: 51 cells/uL (ref 0–200)
Basophils Relative: 1 %
Eosinophils Absolute: 393 cells/uL (ref 15–500)
Eosinophils Relative: 7.7 %
HCT: 29.9 % — ABNORMAL LOW (ref 35.0–45.0)
Hemoglobin: 9.7 g/dL — ABNORMAL LOW (ref 11.7–15.5)
Lymphs Abs: 1046 cells/uL (ref 850–3900)
MCH: 29.8 pg (ref 27.0–33.0)
MCHC: 32.4 g/dL (ref 32.0–36.0)
MCV: 91.7 fL (ref 80.0–100.0)
MPV: 12.4 fL (ref 7.5–12.5)
Monocytes Relative: 13.6 %
Neutro Abs: 2917 cells/uL (ref 1500–7800)
Neutrophils Relative %: 57.2 %
Platelets: 223 10*3/uL (ref 140–400)
RBC: 3.26 10*6/uL — ABNORMAL LOW (ref 3.80–5.10)
RDW: 15.1 % — ABNORMAL HIGH (ref 11.0–15.0)
Total Lymphocyte: 20.5 %
WBC: 5.1 10*3/uL (ref 3.8–10.8)

## 2019-11-08 LAB — COMPLETE METABOLIC PANEL WITH GFR
AG Ratio: 1 (calc) (ref 1.0–2.5)
ALT: 16 U/L (ref 6–29)
AST: 24 U/L (ref 10–35)
Albumin: 3.8 g/dL (ref 3.6–5.1)
Alkaline phosphatase (APISO): 91 U/L (ref 37–153)
BUN/Creatinine Ratio: 37 (calc) — ABNORMAL HIGH (ref 6–22)
BUN: 52 mg/dL — ABNORMAL HIGH (ref 7–25)
CO2: 28 mmol/L (ref 20–32)
Calcium: 9.5 mg/dL (ref 8.6–10.4)
Chloride: 103 mmol/L (ref 98–110)
Creat: 1.41 mg/dL — ABNORMAL HIGH (ref 0.60–0.88)
GFR, Est African American: 37 mL/min/{1.73_m2} — ABNORMAL LOW (ref >=60)
GFR, Est Non African American: 32 mL/min/{1.73_m2} — ABNORMAL LOW (ref >=60)
Globulin: 3.7 g/dL (calc) (ref 1.9–3.7)
Glucose, Bld: 76 mg/dL (ref 65–139)
Potassium: 4.4 mmol/L (ref 3.5–5.3)
Sodium: 139 mmol/L (ref 135–146)
Total Bilirubin: 0.4 mg/dL (ref 0.2–1.2)
Total Protein: 7.5 g/dL (ref 6.1–8.1)

## 2019-11-08 IMAGING — XA IR GI TUBE CONTRAST INJECTION
2 series · 5 of 5 positions shown · non-contrast
Comparison: none

INDICATION: Occluded jejunal lumen of dual-lumen gastrojejunostomy catheter.
Bob Had been instilled into the catheter the previous day.

[Series 2: fl - angio · 4 of 64 frames shown]
[frame 10/64]
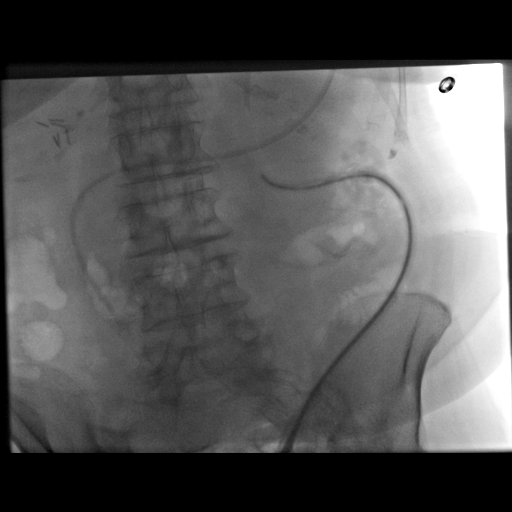
[frame 16/64]
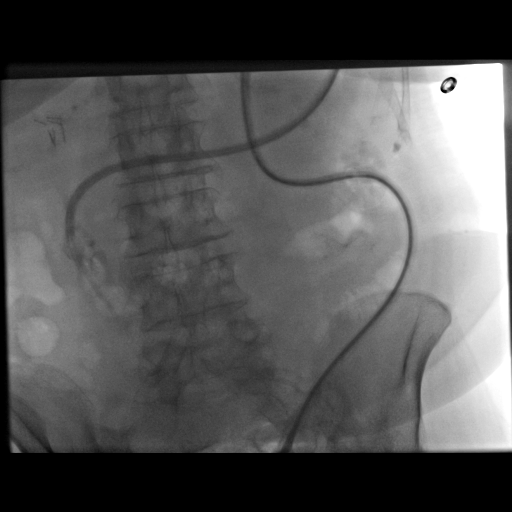
[frame 33/64]
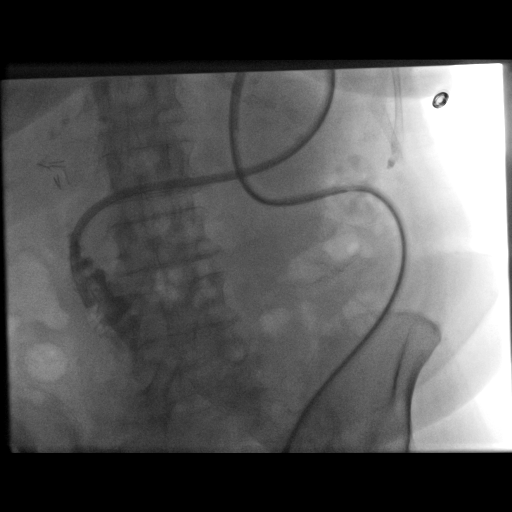
[frame 55/64]
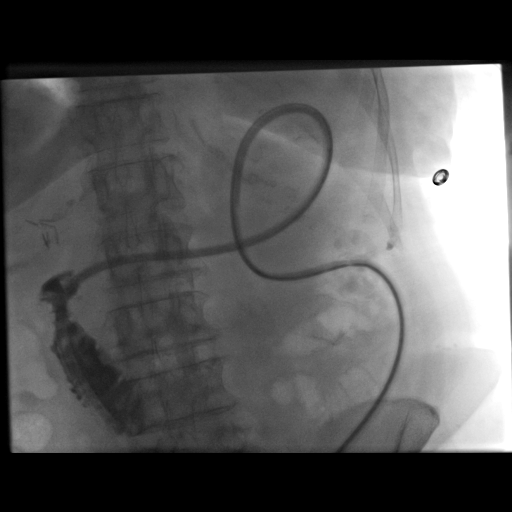

[Series 300: line placements · 1 of 1 slices shown]
[im 1/1]
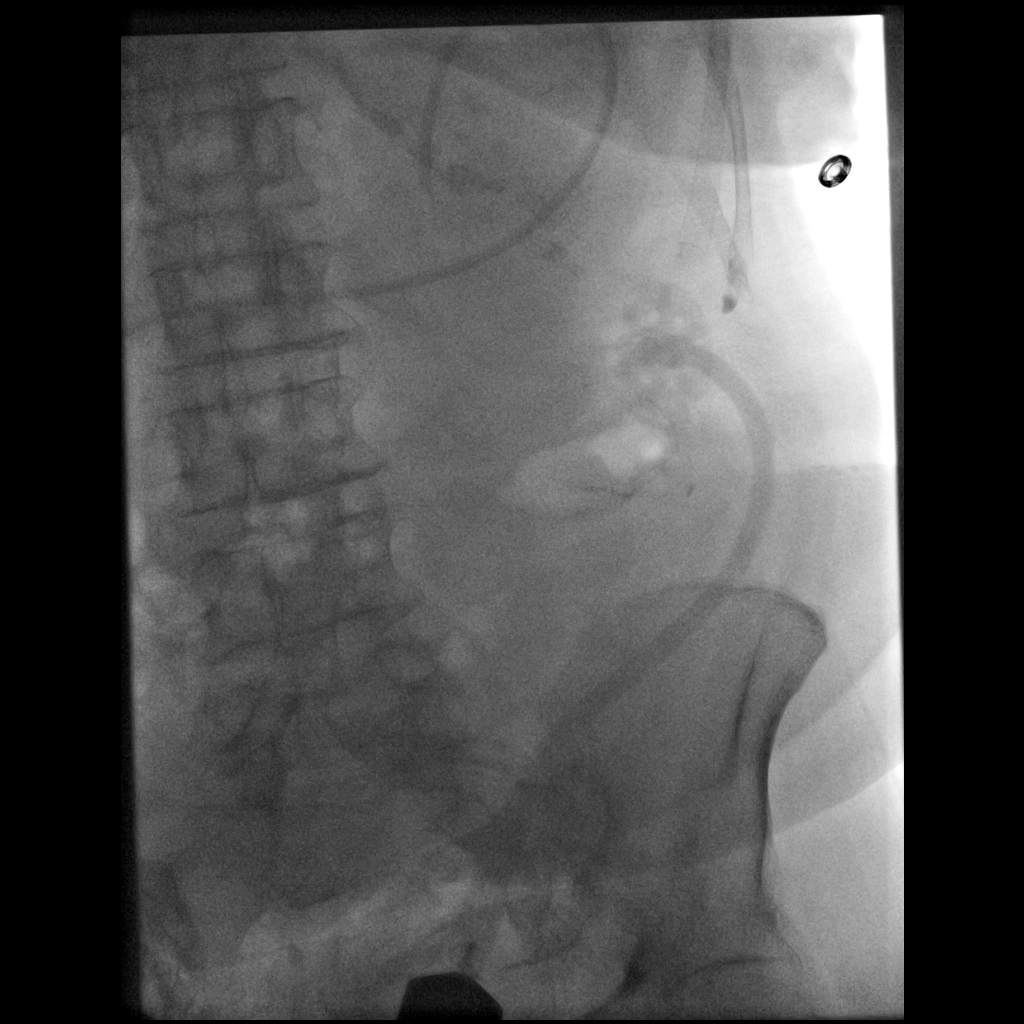

[5 of 5 positions shown; findings below may reference images not displayed]

EXAM:
GI TUBE INJECTION

MEDICATIONS:
None

ANESTHESIA/SEDATION:
None

CONTRAST:  10mL QL0CWO-M00 IOPAMIDOL (QL0CWO-M00) INJECTION 61% -
administered into the gastric lumen.

FLUOROSCOPY TIME:  0.2 minutes; 55  uCym1 DAP

COMPLICATIONS:
None immediate.

PROCEDURE:
Informed written consent was obtained from the patient after a
thorough discussion of the procedural risks, benefits and
alternatives. All questions were addressed. A timeout was performed
prior to the initiation of the procedure.

On scout imaging, there is no kink or fracture of the catheter
evident. Contrast injection demonstrates wide patency of the jejunal
limb of the GJ tube. Tip is in the distal duodenum. No extravasation
or leak.
IMPRESSION: 1. Patent jejunal lumen of GJ tube, okay for routine use.

## 2019-11-10 ENCOUNTER — Other Ambulatory Visit: Payer: Self-pay | Admitting: *Deleted

## 2019-11-10 ENCOUNTER — Telehealth: Payer: Self-pay | Admitting: *Deleted

## 2019-11-10 NOTE — Telephone Encounter (Signed)
   Primary Cardiologist: Ena Dawley, MD  Chart reviewed as part of pre-operative protocol coverage. Given past medical history and time since last visit, based on ACC/AHA guidelines, Shannon [name] would be at acceptable risk for the planned procedure without further cardiovascular testing.   Patient with diagnosis of afib on warfarin for anticoagulation.    Procedure: Transmetatarsal amputation (TMA) of Right Foot Date of procedure: 11/14/19  CHADS2-VASc score of 43 (age x2, sex, CHF, HTN, DM, CAD, stroke), DVT in 2017 (changed from Eliquis to warfarin), as well as lupus.  CrCl 78mL/min Platelet count 240K  She may hold warfarin for 4 days starting today (11/10/19), no bridge needed per MD.  I will route this recommendation to the requesting party via [place] fax function and remove from pre-op pool.  Please call with questions.  [name]. [name] NP-C    11/10/2019, 8:08 AM [address] Office [phone] Fax [phone]

## 2019-11-10 NOTE — Telephone Encounter (Signed)
If procedure is still scheduled for 8/13, pt could hold warfarin for 4 days starting today, no bridge needed per MD.

## 2019-11-10 NOTE — Telephone Encounter (Signed)
I'm calling to inform Ms. Heninger the we received clearance from [facility], NP-C.  She said Ms. Peto can stop her Warfarin four days prior to the surgery date.  "Yes, they called [place] this morning and told us."  I wanted to make sure she's aware.

## 2019-11-10 NOTE — Patient Outreach (Signed)
[facility]) Care Management  11/10/2019  JAHANNA RAETHER 05/03/1923 [id number]   Call placed to member's daughter [name] to follow up on management of member's condition.  She report member will be having the remainder of her toes amputated later this week.  She has continued to have sores develop on her foot as well as discomfort.  Otherwise, she report member is "doing alright."  Discussed recovery following surgery and the need to not bear weight due to the risk of dehiscence.  She verbalizes understanding and state she will have to be a constant reminder as member is so independent.  Daughter denies any urgent concerns, this care manager will follow up within the next week, once member is discharged back home.  Goals Addressed              This Visit's Progress   .  COMPLETED: Patient would like to have relief from hemorrhoids (pt-stated)        CARE PLAN ENTRY (see longitudinal plan of care for additional care plan information)  Current Barriers:  [name] Knowledge Deficits related to treatment for hemorrhoids  Nurse Case Manager Clinical Goal(s):  [name] Over the next 31 days, patient will meet with RN Care Manager to address recovery from hemorrhoids . Over the next 28 days, patient will attend all scheduled medical appointments: with PCP  . Over the next 28 days, the patient will demonstrate ongoing self health care management ability as evidenced by ability to stay independent*  Interventions:  . Inter-disciplinary care team collaboration (see longitudinal plan of care) . Reviewed medications with patient and discussed use of Tucks versus Preparation H . Discussed plans with patient for ongoing care management follow up and provided patient with direct contact information for care management team . Reviewed scheduled/upcoming provider appointments including:   Patient Self Care Activities:  . Attends all scheduled provider appointments . Performs ADL's  independently . Unable to perform IADLs independently  Initial goal documentation       [name], RN, MSN [place] [phone]

## 2019-11-11 ENCOUNTER — Telehealth: Payer: Self-pay | Admitting: Podiatry

## 2019-11-11 NOTE — Telephone Encounter (Signed)
Pt daughter called and wanted to know if they were gonna keep mother in hospital overnight after her surgery please assist

## 2019-11-11 NOTE — Telephone Encounter (Signed)
Yes, patient is going to stay overnight. - Dr. Amalia Hailey

## 2019-11-12 ENCOUNTER — Ambulatory Visit: Payer: Medicare Other | Admitting: Podiatry

## 2019-11-12 DIAGNOSIS — Z20822 Contact with and (suspected) exposure to covid-19: Secondary | ICD-10-CM | POA: Diagnosis not present

## 2019-11-13 ENCOUNTER — Other Ambulatory Visit: Payer: Self-pay

## 2019-11-13 ENCOUNTER — Telehealth: Payer: Self-pay | Admitting: *Deleted

## 2019-11-13 ENCOUNTER — Encounter (HOSPITAL_COMMUNITY): Payer: Self-pay | Admitting: Podiatry

## 2019-11-13 NOTE — Telephone Encounter (Signed)
"  Ms. Yanique Mulvihill is scheduled for surgery tomorrow at [facility].  We don't have any orders in Epic.  Could you get him to put some in?"

## 2019-11-13 NOTE — Progress Notes (Signed)
Spoke with pt's daughter, [name] for pre-op call. DPR on file. Pt has hx of CAD (non obstructive), denies any recent chest pain or sob. Pt has hx of A-fib and is on Coumadin. Pt's cardiologist is Dr. Meda Coffee. Cardiac clearance is in Epic dated 11/10/19 and was instructed to hold Coumadin 4 days prior to surgery. Last dose of Coumadin was 11/09/19.  Pt is diabetic. [name] states pt's blood sugar in the AM prior to eating is usually around 202. Instructed [name] to have pt take 1/2 of her regular dose of Lantus insulin tonight, she will take 4 units. [name] states she only takes the Lantus if her blood sugar is 151 or greater. I instructed her that she would still take only 1/2 dose. Instructed [name] to have pt check her blood sugar in the AM when she gets up and every 2 hours until she leaves for the hospital. If blood sugar is >220 take 1/2 of usual correction dose of Novolog insulin. If blood sugar is 70 or below, treat with 1/2 cup of clear juice (apple or cranberry) and recheck blood sugar 15 minutes after drinking juice. If blood sugar continues to be 70 or below, call the Short Stay department and ask to speak to a nurse. [name] voiced understanding.   Pt tested positive on 08/27/19 for Covid, no test done at this time.

## 2019-11-14 ENCOUNTER — Ambulatory Visit (HOSPITAL_COMMUNITY)
Admission: RE | Admit: 2019-11-14 | Discharge: 2019-11-15 | Disposition: A | Discharge status: Home / Self Care | Payer: Medicare Other | Attending: Podiatry | Admitting: Podiatry

## 2019-11-14 ENCOUNTER — Other Ambulatory Visit: Payer: Self-pay

## 2019-11-14 ENCOUNTER — Ambulatory Visit (HOSPITAL_COMMUNITY): Payer: Medicare Other | Admitting: Anesthesiology

## 2019-11-14 ENCOUNTER — Encounter (HOSPITAL_COMMUNITY): Payer: Self-pay | Admitting: Podiatry

## 2019-11-14 ENCOUNTER — Ambulatory Visit (HOSPITAL_COMMUNITY): Payer: Medicare Other

## 2019-11-14 ENCOUNTER — Encounter (HOSPITAL_COMMUNITY)
Admission: RE | Disposition: A | Discharge status: Home / Self Care | Payer: Self-pay | Source: Home / Self Care | Attending: Podiatry

## 2019-11-14 DIAGNOSIS — N183 Chronic kidney disease, stage 3 unspecified: Secondary | ICD-10-CM | POA: Diagnosis not present

## 2019-11-14 DIAGNOSIS — E1122 Type 2 diabetes mellitus with diabetic chronic kidney disease: Secondary | ICD-10-CM | POA: Diagnosis not present

## 2019-11-14 DIAGNOSIS — E039 Hypothyroidism, unspecified: Secondary | ICD-10-CM | POA: Diagnosis present

## 2019-11-14 DIAGNOSIS — E1169 Type 2 diabetes mellitus with other specified complication: Secondary | ICD-10-CM | POA: Insufficient documentation

## 2019-11-14 DIAGNOSIS — F329 Major depressive disorder, single episode, unspecified: Secondary | ICD-10-CM | POA: Diagnosis not present

## 2019-11-14 DIAGNOSIS — J449 Chronic obstructive pulmonary disease, unspecified: Secondary | ICD-10-CM | POA: Insufficient documentation

## 2019-11-14 DIAGNOSIS — E11621 Type 2 diabetes mellitus with foot ulcer: Secondary | ICD-10-CM | POA: Insufficient documentation

## 2019-11-14 DIAGNOSIS — E1121 Type 2 diabetes mellitus with diabetic nephropathy: Secondary | ICD-10-CM | POA: Diagnosis not present

## 2019-11-14 DIAGNOSIS — I5032 Chronic diastolic (congestive) heart failure: Secondary | ICD-10-CM | POA: Diagnosis not present

## 2019-11-14 DIAGNOSIS — Z9981 Dependence on supplemental oxygen: Secondary | ICD-10-CM | POA: Diagnosis not present

## 2019-11-14 DIAGNOSIS — Z89422 Acquired absence of other left toe(s): Secondary | ICD-10-CM

## 2019-11-14 DIAGNOSIS — Z8616 Personal history of COVID-19: Secondary | ICD-10-CM | POA: Insufficient documentation

## 2019-11-14 DIAGNOSIS — Z89431 Acquired absence of right foot: Secondary | ICD-10-CM | POA: Diagnosis not present

## 2019-11-14 DIAGNOSIS — S98911A Complete traumatic amputation of right foot, level unspecified, initial encounter: Secondary | ICD-10-CM | POA: Diagnosis not present

## 2019-11-14 DIAGNOSIS — Z09 Encounter for follow-up examination after completed treatment for conditions other than malignant neoplasm: Secondary | ICD-10-CM

## 2019-11-14 DIAGNOSIS — E1142 Type 2 diabetes mellitus with diabetic polyneuropathy: Secondary | ICD-10-CM | POA: Diagnosis present

## 2019-11-14 DIAGNOSIS — I2510 Atherosclerotic heart disease of native coronary artery without angina pectoris: Secondary | ICD-10-CM | POA: Diagnosis not present

## 2019-11-14 DIAGNOSIS — L97512 Non-pressure chronic ulcer of other part of right foot with fat layer exposed: Secondary | ICD-10-CM | POA: Diagnosis not present

## 2019-11-14 DIAGNOSIS — Z7901 Long term (current) use of anticoagulants: Secondary | ICD-10-CM | POA: Diagnosis not present

## 2019-11-14 DIAGNOSIS — I4892 Unspecified atrial flutter: Secondary | ICD-10-CM | POA: Insufficient documentation

## 2019-11-14 DIAGNOSIS — M86671 Other chronic osteomyelitis, right ankle and foot: Secondary | ICD-10-CM | POA: Diagnosis not present

## 2019-11-14 DIAGNOSIS — Z794 Long term (current) use of insulin: Secondary | ICD-10-CM | POA: Insufficient documentation

## 2019-11-14 DIAGNOSIS — Z7951 Long term (current) use of inhaled steroids: Secondary | ICD-10-CM | POA: Insufficient documentation

## 2019-11-14 DIAGNOSIS — I130 Hypertensive heart and chronic kidney disease with heart failure and stage 1 through stage 4 chronic kidney disease, or unspecified chronic kidney disease: Secondary | ICD-10-CM | POA: Diagnosis not present

## 2019-11-14 DIAGNOSIS — L97519 Non-pressure chronic ulcer of other part of right foot with unspecified severity: Secondary | ICD-10-CM | POA: Diagnosis not present

## 2019-11-14 DIAGNOSIS — Z8673 Personal history of transient ischemic attack (TIA), and cerebral infarction without residual deficits: Secondary | ICD-10-CM | POA: Insufficient documentation

## 2019-11-14 DIAGNOSIS — Z931 Gastrostomy status: Secondary | ICD-10-CM

## 2019-11-14 DIAGNOSIS — N1832 Chronic kidney disease, stage 3b: Secondary | ICD-10-CM

## 2019-11-14 DIAGNOSIS — E785 Hyperlipidemia, unspecified: Secondary | ICD-10-CM | POA: Diagnosis not present

## 2019-11-14 DIAGNOSIS — Z79899 Other long term (current) drug therapy: Secondary | ICD-10-CM | POA: Insufficient documentation

## 2019-11-14 DIAGNOSIS — E0859 Diabetes mellitus due to underlying condition with other circulatory complications: Secondary | ICD-10-CM | POA: Diagnosis not present

## 2019-11-14 DIAGNOSIS — Z880 Allergy status to penicillin: Secondary | ICD-10-CM | POA: Diagnosis not present

## 2019-11-14 DIAGNOSIS — K219 Gastro-esophageal reflux disease without esophagitis: Secondary | ICD-10-CM | POA: Diagnosis present

## 2019-11-14 DIAGNOSIS — I10 Essential (primary) hypertension: Secondary | ICD-10-CM | POA: Diagnosis not present

## 2019-11-14 DIAGNOSIS — I739 Peripheral vascular disease, unspecified: Secondary | ICD-10-CM | POA: Diagnosis not present

## 2019-11-14 DIAGNOSIS — M1990 Unspecified osteoarthritis, unspecified site: Secondary | ICD-10-CM | POA: Insufficient documentation

## 2019-11-14 DIAGNOSIS — I480 Paroxysmal atrial fibrillation: Secondary | ICD-10-CM | POA: Insufficient documentation

## 2019-11-14 DIAGNOSIS — Z89421 Acquired absence of other right toe(s): Secondary | ICD-10-CM | POA: Diagnosis not present

## 2019-11-14 DIAGNOSIS — N1831 Chronic kidney disease, stage 3a: Secondary | ICD-10-CM | POA: Diagnosis not present

## 2019-11-14 HISTORY — DX: Headache, unspecified: R51.9

## 2019-11-14 HISTORY — DX: Personal history of COVID-19: Z86.16

## 2019-11-14 HISTORY — PX: TRANSMETATARSAL AMPUTATION: SHX6197

## 2019-11-14 LAB — APTT: aPTT: 30 seconds (ref 24–36)

## 2019-11-14 LAB — GLUCOSE, CAPILLARY
Glucose-Capillary: 98 mg/dL (ref 70–99)
Glucose-Capillary: 97 mg/dL (ref 70–99)
Glucose-Capillary: 153 mg/dL — ABNORMAL HIGH (ref 70–99)

## 2019-11-14 LAB — PROTIME-INR
INR: 1.3 — ABNORMAL HIGH (ref 0.8–1.2)
Prothrombin Time: 15.4 seconds — ABNORMAL HIGH (ref 11.4–15.2)

## 2019-11-14 SURGERY — AMPUTATION, FOOT, TRANSMETATARSAL
Anesthesia: Monitor Anesthesia Care | Laterality: Right

## 2019-11-14 MED ORDER — INSULIN GLARGINE 100 UNIT/ML ~~LOC~~ SOLN
8.0000 [IU] | Freq: Every day | SUBCUTANEOUS | Status: DC
  Administered 2019-11-14: 8 [IU] via SUBCUTANEOUS
  Filled 2019-11-14 (×2): qty 0.08

## 2019-11-14 MED ORDER — SODIUM CHLORIDE 0.9% FLUSH: 3.0000 mL | INTRAVENOUS | Status: DC | PRN

## 2019-11-14 MED ORDER — OXYCODONE-ACETAMINOPHEN 5-325 MG PO TABS: 1.0000 | ORAL_TABLET | ORAL | Status: DC | PRN

## 2019-11-14 MED ORDER — AMLODIPINE BESYLATE 5 MG PO TABS
5.0000 mg | ORAL_TABLET | Freq: Every day | ORAL | Status: DC
  Administered 2019-11-15: 5 mg
  Filled 2019-11-14: qty 1

## 2019-11-14 MED ORDER — ACETAMINOPHEN 500 MG PO TABS: 1000.0000 mg | ORAL_TABLET | Freq: Once | ORAL | Status: DC | PRN

## 2019-11-14 MED ORDER — LEVOTHYROXINE SODIUM 25 MCG PO TABS
125.0000 ug | ORAL_TABLET | Freq: Every day | ORAL | Status: DC
  Administered 2019-11-15: 125 ug
  Filled 2019-11-14: qty 1

## 2019-11-14 MED ORDER — PROMETHAZINE HCL 12.5 MG PO TABS
12.5000 mg | ORAL_TABLET | ORAL | Status: DC | PRN
  Filled 2019-11-14: qty 1

## 2019-11-14 MED ORDER — HYDRALAZINE HCL 50 MG PO TABS
50.0000 mg | ORAL_TABLET | Freq: Three times a day (TID) | ORAL | Status: DC
  Administered 2019-11-14 – 2019-11-15 (×2): 50 mg
  Filled 2019-11-14 (×2): qty 1

## 2019-11-14 MED ORDER — LIDOCAINE-EPINEPHRINE 2 %-1:100000 IJ SOLN
INTRAMUSCULAR | Status: AC
  Filled 2019-11-14: qty 1

## 2019-11-14 MED ORDER — MIDAZOLAM HCL 2 MG/2ML IJ SOLN
INTRAMUSCULAR | Status: AC
  Filled 2019-11-14: qty 2

## 2019-11-14 MED ORDER — ORAL CARE MOUTH RINSE: 15.0000 mL | Freq: Once | OROMUCOSAL | Status: AC

## 2019-11-14 MED ORDER — ACETAMINOPHEN 10 MG/ML IV SOLN: 1000.0000 mg | Freq: Once | INTRAVENOUS | Status: DC | PRN

## 2019-11-14 MED ORDER — IPRATROPIUM-ALBUTEROL 0.5-2.5 (3) MG/3ML IN SOLN: 3.0000 mL | Freq: Four times a day (QID) | RESPIRATORY_TRACT | Status: DC | PRN

## 2019-11-14 MED ORDER — ACETAMINOPHEN 325 MG PO TABS: 650.0000 mg | ORAL_TABLET | ORAL | Status: DC | PRN

## 2019-11-14 MED ORDER — FENTANYL CITRATE (PF) 250 MCG/5ML IJ SOLN
INTRAMUSCULAR | Status: AC
  Filled 2019-11-14: qty 5

## 2019-11-14 MED ORDER — INSULIN ASPART 100 UNIT/ML ~~LOC~~ SOLN: 0.0000 [IU] | Freq: Three times a day (TID) | SUBCUTANEOUS | Status: DC

## 2019-11-14 MED ORDER — LACTATED RINGERS IV SOLN: INTRAVENOUS | Status: DC

## 2019-11-14 MED ORDER — DEXAMETHASONE SODIUM PHOSPHATE 10 MG/ML IJ SOLN
INTRAMUSCULAR | Status: DC | PRN
Start: 2019-11-14 — End: 2019-11-14
  Administered 2019-11-14: 10 mg

## 2019-11-14 MED ORDER — SERTRALINE HCL 20 MG/ML PO CONC
50.0000 mg | Freq: Every day | ORAL | Status: DC
  Administered 2019-11-14: 50 mg via ORAL
  Filled 2019-11-14 (×2): qty 2.5

## 2019-11-14 MED ORDER — BUPIVACAINE HCL (PF) 0.5 % IJ SOLN
INTRAMUSCULAR | Status: AC
  Filled 2019-11-14: qty 30

## 2019-11-14 MED ORDER — ATORVASTATIN CALCIUM 10 MG PO TABS
10.0000 mg | ORAL_TABLET | Freq: Every day | ORAL | Status: DC
  Administered 2019-11-15: 10 mg
  Filled 2019-11-14: qty 1

## 2019-11-14 MED ORDER — ACETAMINOPHEN 160 MG/5ML PO SOLN: 1000.0000 mg | Freq: Once | ORAL | Status: DC | PRN

## 2019-11-14 MED ORDER — ISOSORBIDE MONONITRATE ER 30 MG PO TB24
30.0000 mg | ORAL_TABLET | Freq: Every day | ORAL | Status: DC
  Administered 2019-11-15: 30 mg via ORAL
  Filled 2019-11-14: qty 1

## 2019-11-14 MED ORDER — PROPOFOL 1000 MG/100ML IV EMUL
INTRAVENOUS | Status: AC
  Filled 2019-11-14: qty 100

## 2019-11-14 MED ORDER — FENTANYL CITRATE (PF) 100 MCG/2ML IJ SOLN: 50.0000 ug | Freq: Once | INTRAMUSCULAR | Status: AC

## 2019-11-14 MED ORDER — SODIUM CHLORIDE 0.9 % IV SOLN: 250.0000 mL | INTRAVENOUS | Status: DC | PRN

## 2019-11-14 MED ORDER — VANCOMYCIN HCL IN DEXTROSE 1-5 GM/200ML-% IV SOLN
1000.0000 mg | INTRAVENOUS | Status: AC
  Administered 2019-11-14: 1000 mg via INTRAVENOUS
  Filled 2019-11-14: qty 200

## 2019-11-14 MED ORDER — 0.9 % SODIUM CHLORIDE (POUR BTL) OPTIME
TOPICAL | Status: DC | PRN
  Administered 2019-11-14: 1000 mL

## 2019-11-14 MED ORDER — CHLORHEXIDINE GLUCONATE 0.12 % MT SOLN
15.0000 mL | Freq: Once | OROMUCOSAL | Status: AC
  Administered 2019-11-14: 15 mL via OROMUCOSAL
  Filled 2019-11-14: qty 15

## 2019-11-14 MED ORDER — MORPHINE SULFATE (PF) 2 MG/ML IV SOLN: 2.0000 mg | INTRAVENOUS | Status: DC | PRN

## 2019-11-14 MED ORDER — LIDOCAINE-EPINEPHRINE (PF) 1.5 %-1:200000 IJ SOLN
INTRAMUSCULAR | Status: DC | PRN
  Administered 2019-11-14: 5 mL via PERINEURAL

## 2019-11-14 MED ORDER — FENTANYL CITRATE (PF) 100 MCG/2ML IJ SOLN
INTRAMUSCULAR | Status: AC
  Administered 2019-11-14: 50 ug via INTRAVENOUS
  Filled 2019-11-14: qty 2

## 2019-11-14 MED ORDER — MOMETASONE FURO-FORMOTEROL FUM 200-5 MCG/ACT IN AERO
2.0000 | INHALATION_SPRAY | Freq: Two times a day (BID) | RESPIRATORY_TRACT | Status: DC
  Administered 2019-11-14 – 2019-11-15 (×2): 2 via RESPIRATORY_TRACT
  Filled 2019-11-14: qty 8.8

## 2019-11-14 MED ORDER — FENTANYL CITRATE (PF) 100 MCG/2ML IJ SOLN: 25.0000 ug | INTRAMUSCULAR | Status: DC | PRN

## 2019-11-14 MED ORDER — PROPOFOL 500 MG/50ML IV EMUL
INTRAVENOUS | Status: DC | PRN
  Administered 2019-11-14: 100 ug/kg/min via INTRAVENOUS

## 2019-11-14 MED ORDER — ROPIVACAINE HCL 5 MG/ML IJ SOLN
INTRAMUSCULAR | Status: DC | PRN
Start: 2019-11-14 — End: 2019-11-14
  Administered 2019-11-14: 30 mL via PERINEURAL

## 2019-11-14 MED ORDER — GABAPENTIN 250 MG/5ML PO SOLN
300.0000 mg | Freq: Two times a day (BID) | ORAL | Status: DC
  Administered 2019-11-14 – 2019-11-15 (×2): 300 mg
  Filled 2019-11-14 (×3): qty 6

## 2019-11-14 SURGICAL SUPPLY — 43 items
BENZOIN TINCTURE PRP APPL 2/3 (GAUZE/BANDAGES/DRESSINGS) ×2 IMPLANT
BLADE AVERAGE 25X9 (BLADE) IMPLANT
BLADE OSCILLATING /SAGITTAL (BLADE) ×2 IMPLANT
BLADE SURG 15 STRL LF DISP TIS (BLADE) IMPLANT
BLADE SURG 15 STRL SS (BLADE)
BNDG ELASTIC 4X5.8 VLCR STR LF (GAUZE/BANDAGES/DRESSINGS) IMPLANT
BNDG GAUZE ELAST 4 BULKY (GAUZE/BANDAGES/DRESSINGS) ×2 IMPLANT
CHLORAPREP W/TINT 26 (MISCELLANEOUS) IMPLANT
COVER SURGICAL LIGHT HANDLE (MISCELLANEOUS) ×2 IMPLANT
COVER WAND RF STERILE (DRAPES) ×2 IMPLANT
CUFF TOURN SGL QUICK 18X4 (TOURNIQUET CUFF) ×2 IMPLANT
CUFF TOURN SGL QUICK 24 (TOURNIQUET CUFF)
CUFF TRNQT CYL 24X4X16.5-23 (TOURNIQUET CUFF) IMPLANT
DRSG PAD ABDOMINAL 8X10 ST (GAUZE/BANDAGES/DRESSINGS) ×2 IMPLANT
DRSG XEROFORM 1X8 (GAUZE/BANDAGES/DRESSINGS) ×1 IMPLANT
ELECT REM PT RETURN 9FT ADLT (ELECTROSURGICAL)
ELECTRODE REM PT RTRN 9FT ADLT (ELECTROSURGICAL) IMPLANT
GAUZE PACKING IODOFORM 1/4X15 (PACKING) ×2 IMPLANT
GAUZE SPONGE 4X4 12PLY STRL (GAUZE/BANDAGES/DRESSINGS) ×2 IMPLANT
GLOVE BIO SURGEON STRL SZ8 (GLOVE) ×2 IMPLANT
GLOVE BIOGEL PI IND STRL 8 (GLOVE) ×1 IMPLANT
GLOVE BIOGEL PI INDICATOR 8 (GLOVE) ×1
GOWN STRL REUS W/ TWL LRG LVL3 (GOWN DISPOSABLE) ×2 IMPLANT
GOWN STRL REUS W/TWL LRG LVL3 (GOWN DISPOSABLE) ×4
KIT BASIN OR (CUSTOM PROCEDURE TRAY) ×2 IMPLANT
KIT TURNOVER KIT B (KITS) ×2 IMPLANT
NDL PRECISIONGLIDE 27X1.5 (NEEDLE) ×1 IMPLANT
NEEDLE PRECISIONGLIDE 27X1.5 (NEEDLE) ×2 IMPLANT
NS IRRIG 1000ML POUR BTL (IV SOLUTION) ×2 IMPLANT
PACK ORTHO EXTREMITY (CUSTOM PROCEDURE TRAY) ×2 IMPLANT
PAD ARMBOARD 7.5X6 YLW CONV (MISCELLANEOUS) ×4 IMPLANT
PAD CAST 4YDX4 CTTN HI CHSV (CAST SUPPLIES) IMPLANT
PADDING CAST COTTON 4X4 STRL (CAST SUPPLIES)
SOL PREP POV-IOD 4OZ 10% (MISCELLANEOUS) ×2 IMPLANT
STAPLER VISISTAT 35W (STAPLE) IMPLANT
STRIP CLOSURE SKIN 1/2X4 (GAUZE/BANDAGES/DRESSINGS) ×2 IMPLANT
SUT PROLENE 4 0 PS 2 18 (SUTURE) IMPLANT
SUT VIC AB 3-0 PS2 18 (SUTURE) IMPLANT
SUT VICRYL 4-0 PS2 18IN ABS (SUTURE) ×2 IMPLANT
SYR CONTROL 10ML LL (SYRINGE) ×4 IMPLANT
TOWEL GREEN STERILE (TOWEL DISPOSABLE) ×2 IMPLANT
TUBE CONNECTING 12X1/4 (SUCTIONS) ×2 IMPLANT
YANKAUER SUCT BULB TIP NO VENT (SUCTIONS) IMPLANT

## 2019-11-14 NOTE — Anesthesia Procedure Notes (Signed)
Anesthesia Regional Block: Popliteal block   Pre-Anesthetic Checklist: ,, timeout performed, Correct Patient, Correct Site, Correct Laterality, Correct Procedure, Correct Position, site marked, Risks and benefits discussed,  Surgical consent,  Pre-op evaluation,  At surgeon's request and post-op pain management  Laterality: Right  Prep: Maximum Sterile Barrier Precautions used, chloraprep       Needles:  Injection technique: Single-shot  Needle Type: Echogenic Stimulator Needle     Needle Length: 9cm  Needle Gauge: 22     Additional Needles:   Procedures:,,,, ultrasound used (permanent image in chart),,,,  Narrative:  Start time: 11/14/2019 3:28 PM End time: 11/14/2019 3:33 PM Injection made incrementally with aspirations every 5 mL.  Performed by: Personally  Anesthesiologist: Pervis Hocking, DO  Additional Notes: Monitors applied. No increased pain on injection. No increased resistance to injection. Injection made in 5cc increments. Good needle visualization. Patient tolerated procedure well.

## 2019-11-14 NOTE — Telephone Encounter (Signed)
Done

## 2019-11-14 NOTE — Progress Notes (Signed)
Pt just arrived on the unit, VS taken Dinner ordered. Pt daughter at bedside pt AOx4, and has voided. Pt not in any pain at the moment, Block still active in right foot dressing intact.

## 2019-11-14 NOTE — Anesthesia Procedure Notes (Signed)
Anesthesia Regional Block: Adductor canal block   Pre-Anesthetic Checklist: ,, timeout performed, Correct Patient, Correct Site, Correct Laterality, Correct Procedure, Correct Position, site marked, Risks and benefits discussed,  Surgical consent,  Pre-op evaluation,  At surgeon's request and post-op pain management  Laterality: Right  Prep: Maximum Sterile Barrier Precautions used, chloraprep       Needles:  Injection technique: Single-shot  Needle Type: Echogenic Stimulator Needle     Needle Length: 9cm  Needle Gauge: 22     Additional Needles:   Procedures:,,,, ultrasound used (permanent image in chart),,,,  Narrative:  Start time: 11/14/2019 3:33 PM End time: 11/14/2019 3:37 PM Injection made incrementally with aspirations every 5 mL.  Performed by: Personally  Anesthesiologist: Pervis Hocking, DO  Additional Notes: Monitors applied. No increased pain on injection. No increased resistance to injection. Injection made in 5cc increments. Good needle visualization. Patient tolerated procedure well.

## 2019-11-14 NOTE — Anesthesia Preprocedure Evaluation (Addendum)
Anesthesia Evaluation  Patient identified by MRN, date of birth, ID band Patient awake    Reviewed: Allergy & Precautions, NPO status , Patient's Chart, lab work & pertinent test results  Airway Mallampati: I  TM Distance: >3 FB Neck ROM: Full    Dental  (+) Edentulous Upper, Edentulous Lower   Pulmonary shortness of breath and with exertion, asthma , COPD,  COVID + May 2021   Pulmonary exam normal breath sounds clear to auscultation       Cardiovascular hypertension, Pt. on medications + CAD (nonobstructive), + Peripheral Vascular Disease, +CHF (grade 1 diastolic dysfunction) and + DVT (on coumadin)  Normal cardiovascular exam+ dysrhythmias Atrial Fibrillation  Rhythm:Regular Rate:Normal  Echo 08/2019: 1. Left ventricular ejection fraction, by estimation, is 65 to 70%. The  left ventricle has normal function. The left ventricle has no regional  wall motion abnormalities. There is mild concentric left ventricular  hypertrophy. Left ventricular diastolic  parameters are consistent with Grade I diastolic dysfunction (impaired  relaxation).  2. Right ventricular systolic function is normal. The right ventricular  size is normal. There is normal pulmonary artery systolic pressure. The  estimated right ventricular systolic pressure is 01.6 mmHg.  3. The mitral valve is degenerative. Trivial mitral valve regurgitation.  4. The aortic valve has an indeterminant number of cusps. Aortic valve  regurgitation is trivial. Mild aortic valve sclerosis is present, with no  evidence of aortic valve stenosis.  5. The inferior vena cava is normal in size with <50% respiratory  variability, suggesting right atrial pressure of 8 mmHg.    Neuro/Psych  Headaches, PSYCHIATRIC DISORDERS Depression  Neuromuscular disease CVA, No Residual Symptoms    GI/Hepatic Neg liver ROS, hiatal hernia, PUD, GERD  Medicated and Controlled,  Endo/Other   diabetes, Well Controlled, Type 2, Insulin DependentHypothyroidism Last a1c 6.3  Renal/GU CRFRenal diseaseCKD 3, Cr 1.4  negative genitourinary   Musculoskeletal  (+) Arthritis , Osteoarthritis,    Abdominal   Peds negative pediatric ROS (+)  Hematology  (+) Blood dyscrasia, anemia , hct 29.9, plt 223   Anesthesia Other Findings   Reproductive/Obstetrics negative OB ROS                            Anesthesia Physical Anesthesia Plan  ASA: III  Anesthesia Plan: MAC and Regional   Post-op Pain Management:  Regional for Post-op pain   Induction:   PONV Risk Score and Plan: Propofol infusion, TIVA and Treatment may vary due to age or medical condition  Airway Management Planned: Natural Airway and Nasal Cannula  Additional Equipment: None  Intra-op Plan:   Post-operative Plan: Extubation in OR  Informed Consent: I have reviewed the patients History and Physical, chart, labs and discussed the procedure including the risks, benefits and alternatives for the proposed anesthesia with the patient or authorized representative who has indicated his/her understanding and acceptance.     Dental advisory given and Consent reviewed with POA  Plan Discussed with: CRNA  Anesthesia Plan Comments: (Patient and family requesting to avoid GA if possible)       Anesthesia Quick Evaluation

## 2019-11-14 NOTE — Consult Note (Signed)
Medical Consultation   Shannon [name]  WUJ:[id number]  DOB: 01-Feb-1924  DOA: 11/14/2019  PCP: Sharon Seller, NP     Requesting physician: Dr Logan Bores  Reason for consultation: Medical management of DM and Hypertension.    History of Present Illness: Shannon [name] is an 86 y.o. female with prior h/o hypertension, DM type 2, insulin dependent Type 2 DM, hyperlipidemia, chronic diastolic heart failure, hypothyroidism, stage 3  A CKD, DVT in the LLE in 2017 on coumadin, COPD on 2lit of [place] oxygen, sbo , perforated gastric ulcer s/p G tube placement, non obstructive CAD,  G Tube in place was admitted by Dr Logan Bores for right toe wounds and acute on chronic osteomyelitis, underwent transmetatarsal amputation on the right foot today . TRH consulted for management of hypertension and DM and initiation of her home meds.  Patient currently denies any chest pain or sob. No nausea, vomiting or abdominal pain. No h/o melena, syncope, palpitations, cough, headache or dizziness.    No labs from today.    Review of Systems:  ROS As per HPI , All others reviewed and are negative,"  Past Medical History: Past Medical History:  Diagnosis Date  . Acute respiratory failure (HCC)   . Anemia    previous blood transfusions  . Arthritis    "all over"  . Asthma   . Atrial flutter (HCC)   . Bradycardia    requiring previous d/c of BB and reduction of amiodarone  . CAD (coronary artery disease)    nonobstructive per notes  . Chronic diastolic CHF (congestive heart failure) (HCC)   . CKD (chronic kidney disease), stage III   . Complication of blood transfusion    "got the wrong blood type at [facility] in ~ 2015; no adverse reaction that we are aware of"/daughter, [name] (01/27/2016)  . COPD (chronic obstructive pulmonary disease) (HCC)   . Depression    "light case"  . DVT (deep venous thrombosis) (HCC) 01/2016   a. LLE DVT 01/2016 - switched from Eliquis to Coumadin.  [name] Dyspnea     with some activity  . Gastric stenosis    a. s/p stomach tube  . GERD (gastroesophageal reflux disease)   . Headache   . History of 2019 novel coronavirus disease (COVID-19)   . History of blood transfusion    "several" (01/27/2016)  . History of stomach ulcers   . Hyperlipidemia   . Hypertension   . Hypothyroidism   . On home oxygen therapy    "2L; 9PM til 9AM" (06/27/2017)  . PAD (peripheral artery disease) (HCC)    a. prior gangrene, toe amputations, intervention.  [name] PAF (paroxysmal atrial fibrillation) (HCC)   . Paraesophageal hernia   . Perforated gastric ulcer (HCC)   . Pneumonia    "a few times" (06/27/2017)  . Seasonal allergies   . SIADH (syndrome of inappropriate ADH production) (HCC)    [name] 01/10/2015  . Small bowel obstruction (HCC)    "I don't know how many" (01/11/2015)  . Stroke [facility])    several- no residual  . Type II diabetes mellitus (HCC)    "related to prednisone use  for > 20 yr; once predinose stopped; no more DM RX" (01/27/2016)  . UTI (urinary tract infection) 02/08/2016  . Ventral hernia with bowel obstruction     Past Surgical History: Past Surgical History:  Procedure Laterality Date  . ABDOMINAL AORTOGRAM  N/A 09/21/2016   Procedure: Abdominal Aortogram;  Surgeon: Maeola Harman, MD;  Location: [facility] INVASIVE CV LAB;  Service: Cardiovascular;  Laterality: N/A;  . AMPUTATION Left 09/25/2016   Procedure: AMPUTATION DIGIT- LEFT 2ND AND 3RD TOES;  Surgeon: Larina Earthly, MD;  Location: [facility] OR;  Service: Vascular;  Laterality: Left;  . AMPUTATION Right 05/03/2018   Procedure: AMPUTATION RIGHT GREAT TOE;  Surgeon: Larina Earthly, MD;  Location: [facility] OR;  Service: Vascular;  Laterality: Right;  . CATARACT EXTRACTION W/ INTRAOCULAR LENS  IMPLANT, BILATERAL    . CHOLECYSTECTOMY OPEN    . COLECTOMY    . ESOPHAGOGASTRODUODENOSCOPY N/A 01/19/2014   Procedure: ESOPHAGOGASTRODUODENOSCOPY (EGD);  Surgeon: Hilarie Fredrickson, MD;  Location: [facility]COPY;  Service:  Endoscopy;  Laterality: N/A;  . ESOPHAGOGASTRODUODENOSCOPY N/A 01/20/2014   Procedure: ESOPHAGOGASTRODUODENOSCOPY (EGD);  Surgeon: Hilarie Fredrickson, MD;  Location: [facility];  Service: Endoscopy;  Laterality: N/A;  . ESOPHAGOGASTRODUODENOSCOPY N/A 03/19/2014   Procedure: ESOPHAGOGASTRODUODENOSCOPY (EGD);  Surgeon: Rachael Fee, MD;  Location: [facility];  Service: Endoscopy;  Laterality: N/A;  . ESOPHAGOGASTRODUODENOSCOPY N/A 07/08/2015   Procedure: ESOPHAGOGASTRODUODENOSCOPY (EGD);  Surgeon: Sherrilyn Rist, MD;  Location: [facility] ENDOSCOPY;  Service: Endoscopy;  Laterality: N/A;  . ESOPHAGOGASTRODUODENOSCOPY (EGD) WITH PROPOFOL N/A 09/15/2015   Procedure: ESOPHAGOGASTRODUODENOSCOPY (EGD) WITH PROPOFOL;  Surgeon: Ruffin Frederick, MD;  Location: [facility] ENDOSCOPY;  Service: Gastroenterology;  Laterality: N/A;  . GASTROJEJUNOSTOMY     hx/notes 01/10/2015  . GASTROJEJUNOSTOMY N/A 09/23/2015   Procedure: OPEN GASTROJEJUNOSTOMY TUBE PLACEMENT;  Surgeon: De Blanch Kinsinger, MD;  Location: [facility] ORS;  Service: General;  Laterality: N/A;  . GLAUCOMA SURGERY Bilateral   . HERNIA REPAIR  2015  . IR CM INJ ANY COLONIC TUBE W/FLUORO  01/05/2017  . IR CM INJ ANY COLONIC TUBE W/FLUORO  06/07/2017  . IR CM INJ ANY COLONIC TUBE W/FLUORO  11/05/2017  . IR CM INJ ANY COLONIC TUBE W/FLUORO  09/18/2018  . IR CM INJ ANY COLONIC TUBE W/FLUORO  03/06/2019  . IR CM INJ ANY COLONIC TUBE W/FLUORO  03/11/2019  . IR GENERIC HISTORICAL  01/07/2016   IR GJ TUBE CHANGE 01/07/2016 Malachy Moan, MD [facility]  . IR GENERIC HISTORICAL  01/27/2016   IR MECH REMOV OBSTRUC MAT ANY COLON TUBE W/FLUORO 01/27/2016 Richarda Overlie, MD [facility]  . IR GENERIC HISTORICAL  02/07/2016   IR PATIENT EVAL TECH 0-60 MINS Darrell K Allred, PA-C [facility]  . IR GENERIC HISTORICAL  02/08/2016   IR GJ TUBE CHANGE 02/08/2016 Berdine Dance, MD [facility]  . IR GENERIC HISTORICAL  01/06/2016   IR GJ TUBE CHANGE 01/06/2016 [facility] OUT REF  . IR  GENERIC HISTORICAL  05/02/2016   IR CM INJ ANY COLONIC TUBE W/FLUORO 05/02/2016 Oley Balm, MD [facility]  . IR GENERIC HISTORICAL  05/15/2016   IR GJ TUBE CHANGE 05/15/2016 Simonne Come, MD [facility]  . IR GENERIC HISTORICAL  06/28/2016   IR GJ TUBE CHANGE 06/28/2016 [facility]  . IR GJ TUBE CHANGE  02/20/2017  . IR GJ TUBE CHANGE  05/10/2017  . IR GJ TUBE CHANGE  07/06/2017  . IR GJ TUBE CHANGE  08/02/2017  . IR GJ TUBE CHANGE  10/15/2017  . IR GJ TUBE CHANGE  12/26/2017  . IR GJ TUBE CHANGE  04/08/2018  . IR GJ TUBE CHANGE  06/19/2018  . IR GJ TUBE CHANGE  03/03/2019  . IR GJ TUBE CHANGE  06/06/2019  . IR  GJ TUBE CHANGE  09/26/2019  . IR PATIENT EVAL TECH 0-60 MINS  10/19/2016  . IR PATIENT EVAL TECH 0-60 MINS  12/25/2016  . IR PATIENT EVAL TECH 0-60 MINS  01/29/2017  . IR PATIENT EVAL TECH 0-60 MINS  04/04/2017  . IR PATIENT EVAL TECH 0-60 MINS  04/30/2017  . IR PATIENT EVAL TECH 0-60 MINS  07/31/2017  . IR REPLC DUODEN/JEJUNO TUBE PERCUT W/FLUORO  11/14/2016  . LAPAROTOMY N/A 01/20/2015   Procedure: EXPLORATORY LAPAROTOMY;  Surgeon: Abigail Miyamoto, MD;  Location: [facility] OR;  Service: General;  Laterality: N/A;  . LOWER EXTREMITY ANGIOGRAPHY Left 09/21/2016   Procedure: Lower Extremity Angiography;  Surgeon: Maeola Harman, MD;  Location[facility] INVASIVE CV LAB;  Service: Cardiovascular;  Laterality: Left;  . LOWER EXTREMITY ANGIOGRAPHY Right 06/27/2017   Procedure: Lower Extremity Angiography;  Surgeon: Maeola Harman, MD;  Location: [facility] INVASIVE CV LAB;  Service: Cardiovascular;  Laterality: Right;  . LYSIS OF ADHESION N/A 01/20/2015   Procedure: LYSIS OF ADHESIONS < 1 HOUR;  Surgeon: Abigail Miyamoto, MD;  Location: [facility] OR;  Service: General;  Laterality: N/A;  . PERIPHERAL VASCULAR BALLOON ANGIOPLASTY Left 09/21/2016   Procedure: Peripheral Vascular Balloon Angioplasty;  Surgeon: Maeola Harman, MD;  Location: [facility] INVASIVE CV LAB;  Service: Cardiovascular;  Laterality:  Left;  drug coated balloon  . PERIPHERAL VASCULAR BALLOON ANGIOPLASTY Right 06/27/2017   Procedure: PERIPHERAL VASCULAR BALLOON ANGIOPLASTY;  Surgeon: Maeola Harman, MD;  Location: [facility] INVASIVE CV LAB;  Service: Cardiovascular;  Laterality: Right;  SFA/TPTRUNK  . TONSILLECTOMY    . TUBAL LIGATION    . VENTRAL HERNIA REPAIR  2015   incarcerated ventral hernia ([facility] 09/2013)/notes 01/10/2015     Allergies:   Allergies  Allergen Reactions  . Penicillins Itching, Rash and Other (See Comments)    Tolerated amoxicillin, unasyn, zosyn & cephalosporins in the past. Did it involve swelling of the face/tongue/throat, SOB, or low BP? No Did it involve sudden or severe rash/hives, skin peeling, or any reaction on the inside of your mouth or nose? No Did you need to seek medical attention at a hospital or doctor's office? Unknown When did it last happen?5+ years If all above answers are "NO", may proceed with cephalosporin use.      Social History:  reports that she has never smoked. She quit smokeless tobacco use about 39 years ago.  Her smokeless tobacco use included snuff. She reports that she does not drink alcohol and does not use drugs.   Family History: Family History  Problem Relation Age of Onset  . Stroke Mother   . Hypertension Mother   . Diabetes Brother   . Glaucoma Son   . Glaucoma Son   . Heart attack Neg Hx   . Colon cancer Neg Hx   . Stomach cancer Neg Hx      Family history reviewed and not pertinent    Physical Exam: Vitals:   11/14/19 1655 11/14/19 1725 11/14/19 1740 11/14/19 1755  BP:  (!) 165/61 (!) 173/69 (!) 178/65  Pulse:  72 70 63  Resp:  16 13 15   Temp: 97.8 F (36.6 C)  97.6 F (36.4 C)   TempSrc:      SpO2:  100% 100% 100%  Weight:      Height:        Constitutional:   Alert and awake, oriented x3, not in any acute distress. Eyes: PERLA, EOMI,  ENMT: external ears and nose appear normal, Neck:  neck appears normal,  no  thyromegaly, no JVD  CVS: S1-S2 clear, RRR, no JVD, no pedal edema. Respiratory:  clear to auscultation bilaterally,  Respiratory effort normal. No accessory muscle use.  Abdomen: soft nontender, nondistended, normal bowel sounds Musculoskeletal: : right foot bandaged.  Neuro: alert and oriented, grossly non focal.  Psych: mood is appropriate.  Skin: no rashes     Data reviewed:  I have personally reviewed following labs and imaging studies Labs:  CBC: No results for input(s): WBC, NEUTROABS, HGB, HCT, MCV, PLT in the last 168 hours.  Basic Metabolic Panel: No results for input(s): NA, K, CL, CO2, GLUCOSE, BUN, CREATININE, CALCIUM, MG, PHOS in the last 168 hours. GFR Estimated Creatinine Clearance: 22.5 mL/min (A) (by C-G formula based on SCr of 1.41 mg/dL (H)). Liver Function Tests: No results for input(s): AST, ALT, ALKPHOS, BILITOT, PROT, ALBUMIN in the last 168 hours. No results for input(s): LIPASE, AMYLASE in the last 168 hours. No results for input(s): AMMONIA in the last 168 hours. Coagulation profile Recent Labs  Lab 11/14/19 1339  INR 1.3*    Cardiac Enzymes: No results for input(s): CKTOTAL, CKMB, CKMBINDEX, TROPONINI in the last 168 hours. BNP: Invalid input(s): POCBNP CBG: Recent Labs  Lab 11/14/19 1228 11/14/19 1718  GLUCAP 98 97   D-Dimer No results for input(s): DDIMER in the last 72 hours. Hgb A1c No results for input(s): HGBA1C in the last 72 hours. Lipid Profile No results for input(s): CHOL, HDL, LDLCALC, TRIG, CHOLHDL, LDLDIRECT in the last 72 hours. Thyroid function studies No results for input(s): TSH, T4TOTAL, T3FREE, THYROIDAB in the last 72 hours.  Invalid input(s): FREET3 Anemia work up No results for input(s): VITAMINB12, FOLATE, FERRITIN, TIBC, IRON, RETICCTPCT in the last 72 hours. Urinalysis    Component Value Date/Time   COLORURINE YELLOW 09/24/2019 2215   APPEARANCEUR CLEAR 09/24/2019 2215   LABSPEC 1.017 09/24/2019 2215    PHURINE 7.0 09/24/2019 2215   GLUCOSEU NEGATIVE 09/24/2019 2215   HGBUR NEGATIVE 09/24/2019 2215   BILIRUBINUR NEGATIVE 09/24/2019 2215   BILIRUBINUR Neg 11/01/2017 1228   KETONESUR NEGATIVE 09/24/2019 2215   PROTEINUR NEGATIVE 09/24/2019 2215   UROBILINOGEN 0.2 11/01/2017 1228   UROBILINOGEN 0.2 01/27/2015 2308   NITRITE NEGATIVE 09/24/2019 2215   LEUKOCYTESUR NEGATIVE 09/24/2019 2215     Microbiology No results found for this or any previous visit (from the past 240 hour(s)).     Inpatient Medications:   Scheduled Meds: . [START ON 11/15/2019] insulin aspart  0-9 Units Subcutaneous TID WC   Continuous Infusions: . sodium chloride    . acetaminophen    . lactated ringers 10 mL/hr at 11/14/19 1342     Radiological Exams on Admission: DG Foot Complete Right  Result Date: 11/14/2019 CLINICAL DATA:  Transmetatarsal amputation EXAM: RIGHT FOOT COMPLETE - 3+ VIEW COMPARISON:  10/20/2019 FINDINGS: Transmetatarsal amputation of the right foot. No additional bony abnormality. Soft tissue gas noted at the stump, likely postoperative. IMPRESSION: Transmetatarsal amputation.  No visible complicating feature. Electronically Signed   By: Charlett Nose M.D.   On: 11/14/2019 17:43    Impression/Recommendations Active Problems:   Essential hypertension, benign   GERD (gastroesophageal reflux disease)   Type 2 diabetes mellitus with chronic kidney disease (HCC)   Hypothyroidism   CKD (chronic kidney disease) stage 3, GFR 30-59 ml/min   Diabetic polyneuropathy associated with type 2 diabetes mellitus (HCC)   PEG (percutaneous endoscopic gastrostomy) status (HCC)   S/P amputation of lesser toe, left (  HCC)   History of transmetatarsal amputation of right foot (HCC)  Right foot wounds s/p metatarsal amputation on the right.  Further management as per podiatry.  Pain control and PT evaluation in am.    Essential hypertension;  BP parameters are not well controlled .  Restart home meds  Norvasc 5 mg daily, hydralazine 50 mg TID, .    Chronic diastolic heart failure:  She appears to be compensated, hold lasix in view of post op .  Restart lasix tomorrow after BMP.  Last echo from 2/ 2021 shows Left ventricular ejection fraction, by estimation, is 65 to 70%. The  left ventricle has normal function. The left ventricle has no regional wall motion abnormalities. There is mild concentric left ventricular hypertrophy. Left ventricular diastolic parameters are consistent with Grade I diastolic dysfunction (impaired relaxation).     Stage 3 a CKD:  Creatinine appears to be at baseline.  Check BMP in am.    Insulin dependent DM with diabetic CKD, and peripheral neuropathy;  Resume lantus and SSI. A1c is 6. 3 in 10/2019.     Hypothyroidism;  Resume synthroid.    GOO , h/o perforated gastric ulcer s/p PEG placement.  Nutritionist consulted for tube feeds.  Pt also take full liquid diet by mouth.     PAF: She currently appears to be in NSR.  Rate well controlled.  On coumadin at home which is being held for now.  Will probably resume it tomorrow after discussing with podiatry.     Thank you for this consultation.  [facility] hospitalist team will follow the patient with you.   Time Spent: 38 minutes.   Kathlen Mody M.D. [facility] 11/14/2019, 6:08 PM

## 2019-11-14 NOTE — Brief Op Note (Signed)
11/14/2019  4:47 PM  PATIENT:  Shannon [name]  86 y.o. female  PRE-OPERATIVE DIAGNOSIS:  RIGHT FOOT ULCER  POST-OPERATIVE DIAGNOSIS:  RIGHT FOOT ULCER  PROCEDURE:  Procedure(s): TRANSMETATARSAL AMPUTATION RIGHT FOOT (Right)  SURGEON:  Surgeon(s) and Role:    Edrick Kins, DPM - Primary  PHYSICIAN ASSISTANT:   ASSISTANTS: none   ANESTHESIA:   regional and IV sedation  EBL:  10 mL   BLOOD ADMINISTERED:none  DRAINS: none   LOCAL MEDICATIONS USED:  NONE  SPECIMEN:  No Specimen  DISPOSITION OF SPECIMEN:  N/A  COUNTS:  YES  TOURNIQUET:   Total Tourniquet Time Documented: area (laterality) - 36 minutes Total: area (laterality) - 36 minutes   DICTATION: .Viviann Spare Dictation  PLAN OF CARE: Admit for overnight observation  PATIENT DISPOSITION:  PACU - hemodynamically stable.   Delay start of Pharmacological VTE agent (>24hrs) due to surgical blood loss or risk of bleeding: yes  Edrick Kins, DPM [facility]  Dr. Edrick Kins, DPM    [address], [place] [address]                Office [phone]  Fax [phone]

## 2019-11-14 NOTE — Transfer of Care (Signed)
Immediate Anesthesia Transfer of Care Note  Patient: Shannon [name]  Procedure(s) Performed: TRANSMETATARSAL AMPUTATION RIGHT FOOT (Right )  Patient Location: PACU  Anesthesia Type:MAC and Regional  Level of Consciousness: oriented  Airway & Oxygen Therapy: Patient Spontanous Breathing  Post-op Assessment: Report given to RN  Post vital signs: Reviewed and stable  Last Vitals:  Vitals Value Taken Time  BP 151/57 11/14/19 1653  Temp    Pulse 71 11/14/19 1657  Resp 16 11/14/19 1657  SpO2 100 % 11/14/19 1657  Vitals shown include unvalidated device data.  Last Pain:  Vitals:   11/14/19 1334  TempSrc:   PainSc: 0-No pain         Complications: No complications documented.

## 2019-11-14 NOTE — Telephone Encounter (Signed)
Spoke with Daughter. - Dr. Amalia Hailey

## 2019-11-14 NOTE — Interval H&P Note (Signed)
History and Physical Interval Note:  11/14/2019 2:17 PM  Shannon [name]  has presented today for surgery, with the diagnosis of RIGHT FOOT ULCER.  The various methods of treatment have been discussed with the patient and family. After consideration of risks, benefits and other options for treatment, the patient has consented to  Procedure(s): TRANSMETATARSAL AMPUTATION RIGHT FOOT (Right) as a surgical intervention.  The patient's history has been reviewed, patient examined, no change in status, stable for surgery.  I have reviewed the patient's chart and labs.  Questions were answered to the patient's satisfaction.     Edrick Kins

## 2019-11-14 NOTE — Anesthesia Procedure Notes (Signed)
Procedure Name: MAC Date/Time: 11/14/2019 3:53 PM Performed by: Barrington Ellison, CRNA Pre-anesthesia Checklist: Patient identified, Emergency Drugs available, Suction available, Patient being monitored and Timeout performed Patient Re-evaluated:Patient Re-evaluated prior to induction Oxygen Delivery Method: Simple face mask

## 2019-11-15 DIAGNOSIS — M86671 Other chronic osteomyelitis, right ankle and foot: Secondary | ICD-10-CM | POA: Diagnosis not present

## 2019-11-15 DIAGNOSIS — E1121 Type 2 diabetes mellitus with diabetic nephropathy: Secondary | ICD-10-CM | POA: Diagnosis not present

## 2019-11-15 DIAGNOSIS — Z89422 Acquired absence of other left toe(s): Secondary | ICD-10-CM

## 2019-11-15 DIAGNOSIS — E11621 Type 2 diabetes mellitus with foot ulcer: Secondary | ICD-10-CM | POA: Diagnosis not present

## 2019-11-15 DIAGNOSIS — L97519 Non-pressure chronic ulcer of other part of right foot with unspecified severity: Secondary | ICD-10-CM | POA: Diagnosis not present

## 2019-11-15 DIAGNOSIS — E1122 Type 2 diabetes mellitus with diabetic chronic kidney disease: Secondary | ICD-10-CM | POA: Diagnosis not present

## 2019-11-15 DIAGNOSIS — I130 Hypertensive heart and chronic kidney disease with heart failure and stage 1 through stage 4 chronic kidney disease, or unspecified chronic kidney disease: Secondary | ICD-10-CM | POA: Diagnosis not present

## 2019-11-15 DIAGNOSIS — N1832 Chronic kidney disease, stage 3b: Secondary | ICD-10-CM | POA: Diagnosis not present

## 2019-11-15 DIAGNOSIS — E1169 Type 2 diabetes mellitus with other specified complication: Secondary | ICD-10-CM | POA: Diagnosis not present

## 2019-11-15 DIAGNOSIS — Z794 Long term (current) use of insulin: Secondary | ICD-10-CM | POA: Diagnosis not present

## 2019-11-15 LAB — CBC WITH DIFFERENTIAL/PLATELET
Abs Immature Granulocytes: 0.01 10*3/uL (ref 0.00–0.07)
Basophils Absolute: 0 10*3/uL (ref 0.0–0.1)
Basophils Relative: 0 %
Eosinophils Absolute: 0 10*3/uL (ref 0.0–0.5)
Eosinophils Relative: 0 %
HCT: 27.7 % — ABNORMAL LOW (ref 36.0–46.0)
Hemoglobin: 9.3 g/dL — ABNORMAL LOW (ref 12.0–15.0)
Immature Granulocytes: 0 %
Lymphocytes Relative: 7 %
Lymphs Abs: 0.3 10*3/uL — ABNORMAL LOW (ref 0.7–4.0)
MCH: 30.3 pg (ref 26.0–34.0)
MCHC: 33.6 g/dL (ref 30.0–36.0)
MCV: 90.2 fL (ref 80.0–100.0)
Monocytes Absolute: 0.2 10*3/uL (ref 0.1–1.0)
Monocytes Relative: 3 %
Neutro Abs: 4.4 10*3/uL (ref 1.7–7.7)
Neutrophils Relative %: 90 %
Platelets: 193 10*3/uL (ref 150–400)
RBC: 3.07 MIL/uL — ABNORMAL LOW (ref 3.87–5.11)
RDW: 17.6 % — ABNORMAL HIGH (ref 11.5–15.5)
WBC: 4.9 10*3/uL (ref 4.0–10.5)
nRBC: 0 % (ref 0.0–0.2)

## 2019-11-15 LAB — COMPREHENSIVE METABOLIC PANEL
ALT: 18 U/L (ref 0–44)
AST: 25 U/L (ref 15–41)
Albumin: 3 g/dL — ABNORMAL LOW (ref 3.5–5.0)
Alkaline Phosphatase: 78 U/L (ref 38–126)
Anion gap: 10 (ref 5–15)
BUN: 37 mg/dL — ABNORMAL HIGH (ref 8–23)
CO2: 24 mmol/L (ref 22–32)
Calcium: 9.3 mg/dL (ref 8.9–10.3)
Chloride: 100 mmol/L (ref 98–111)
Creatinine, Ser: 1.37 mg/dL — ABNORMAL HIGH (ref 0.44–1.00)
GFR calc Af Amer: 38 mL/min — ABNORMAL LOW (ref >60)
GFR calc non Af Amer: 33 mL/min — ABNORMAL LOW (ref >60)
Glucose, Bld: 162 mg/dL — ABNORMAL HIGH (ref 70–99)
Potassium: 4.3 mmol/L (ref 3.5–5.1)
Sodium: 134 mmol/L — ABNORMAL LOW (ref 135–145)
Total Bilirubin: 0.7 mg/dL (ref 0.3–1.2)
Total Protein: 7 g/dL (ref 6.5–8.1)

## 2019-11-15 LAB — PHOSPHORUS: Phosphorus: 4.4 mg/dL (ref 2.5–4.6)

## 2019-11-15 LAB — PROTIME-INR
INR: 1.3 — ABNORMAL HIGH (ref 0.8–1.2)
Prothrombin Time: 15.8 seconds — ABNORMAL HIGH (ref 11.4–15.2)

## 2019-11-15 LAB — MAGNESIUM: Magnesium: 2.1 mg/dL (ref 1.7–2.4)

## 2019-11-15 LAB — GLUCOSE, CAPILLARY: Glucose-Capillary: 135 mg/dL — ABNORMAL HIGH (ref 70–99)

## 2019-11-15 MED ORDER — FUROSEMIDE 20 MG PO TABS
20.0000 mg | ORAL_TABLET | Freq: Every day | ORAL | Status: DC
  Administered 2019-11-15: 20 mg via ORAL
  Filled 2019-11-15: qty 1

## 2019-11-15 MED ORDER — ADULT MULTIVITAMIN W/MINERALS CH: 1.0000 | ORAL_TABLET | Freq: Every day | ORAL | Status: DC

## 2019-11-15 MED ORDER — WARFARIN SODIUM 5 MG PO TABS: 5.0000 mg | ORAL_TABLET | Freq: Once | ORAL | Status: DC

## 2019-11-15 MED ORDER — OSMOLITE 1.2 CAL PO LIQD
720.0000 mL | ORAL | Status: DC
  Filled 2019-11-15: qty 948

## 2019-11-15 MED ORDER — ENSURE ENLIVE PO LIQD
237.0000 mL | Freq: Two times a day (BID) | ORAL | Status: DC
  Administered 2019-11-15: 237 mL via ORAL

## 2019-11-15 MED ORDER — FREE WATER
150.0000 mL | Freq: Three times a day (TID) | Status: DC
  Administered 2019-11-15: 150 mL

## 2019-11-15 MED ORDER — WARFARIN - PHARMACIST DOSING INPATIENT: Freq: Every day | Status: DC

## 2019-11-15 MED ORDER — PROSOURCE TF PO LIQD
45.0000 mL | Freq: Two times a day (BID) | ORAL | Status: DC
  Filled 2019-11-15 (×2): qty 45

## 2019-11-15 MED ORDER — SERTRALINE HCL 50 MG PO TABS
50.0000 mg | ORAL_TABLET | Freq: Every day | ORAL | Status: DC
  Administered 2019-11-15: 50 mg
  Filled 2019-11-15: qty 1

## 2019-11-15 NOTE — Anesthesia Postprocedure Evaluation (Signed)
Anesthesia Post Note  Patient: Shannon [name]  Procedure(s) Performed: TRANSMETATARSAL AMPUTATION RIGHT FOOT (Right )     Patient location during evaluation: PACU Anesthesia Type: Regional and MAC Level of consciousness: patient cooperative and awake Pain management: pain level controlled Vital Signs Assessment: post-procedure vital signs reviewed and stable Respiratory status: spontaneous breathing, nonlabored ventilation, respiratory function stable and patient connected to nasal cannula oxygen Cardiovascular status: stable and blood pressure returned to baseline Postop Assessment: no apparent nausea or vomiting Anesthetic complications: no   No complications documented.  Last Vitals:  Vitals:   11/15/19 0021 11/15/19 0447  BP: (!) 145/63 (!) 141/57  Pulse: 71 64  Resp: 15 17  Temp: 36.6 C 36.6 C  SpO2: 100% 100%    Last Pain:  Vitals:   11/15/19 0744  TempSrc:   PainSc: 0-No pain                 Necha Harries

## 2019-11-15 NOTE — Progress Notes (Signed)
ANTICOAGULATION CONSULT NOTE - Initial Consult  Pharmacy Consult for warfarin dosing  Indication: atrial fibrillation  Allergies  Allergen Reactions   Penicillins Itching, Rash and Other (See Comments)    Tolerated amoxicillin, unasyn, zosyn & cephalosporins in the past. Did it involve swelling of the face/tongue/throat, SOB, or low BP? No Did it involve sudden or severe rash/hives, skin peeling, or any reaction on the inside of your mouth or nose? No Did you need to seek medical attention at a hospital or doctor's office? Unknown When did it last happen?5+ years If all above answers are "NO", may proceed with cephalosporin use.     Patient Measurements: Height: 5\' 6"  (167.6 cm) Weight: 58.5 kg (129 lb) IBW/kg (Calculated) : 59.3  Vital Signs: Temp: 97.8 F (36.6 C) (08/14 0447) Temp Source: Oral (08/14 0447) BP: 141/57 (08/14 0447) Pulse Rate: 64 (08/14 0447)  Labs: Recent Labs    11/14/19 1339 11/15/19 0506  HGB  --  9.3*  HCT  --  27.7*  PLT  --  193  APTT 30  --   LABPROT 15.4* 15.8*  INR 1.3* 1.3*  CREATININE  --  1.37*    Estimated Creatinine Clearance: 23.2 mL/min (A) (by C-G formula based on SCr of 1.37 mg/dL (H)).   Medical History: Past Medical History:  Diagnosis Date   Acute respiratory failure ([place])    Anemia    previous blood transfusions   Arthritis    "all over"   Asthma    Atrial flutter (HCC)    Bradycardia    requiring previous d/c of BB and reduction of amiodarone   CAD (coronary artery disease)    nonobstructive per notes   Chronic diastolic CHF (congestive heart failure) ([place])    CKD (chronic kidney disease), stage III    Complication of blood transfusion    "got the wrong blood type at [facility] in ~ 2015; no adverse reaction that we are aware of"/daughter, [name] (01/27/2016)   COPD (chronic obstructive pulmonary disease) ([place])    Depression    "light case"   DVT (deep venous thrombosis) ([place]) 01/2016   a.  LLE DVT 01/2016 - switched from Eliquis to Coumadin.   Dyspnea    with some activity   Gastric stenosis    a. s/p stomach tube   GERD (gastroesophageal reflux disease)    Headache    History of 2019 novel coronavirus disease (COVID-19)    History of blood transfusion    "several" (01/27/2016)   History of stomach ulcers    Hyperlipidemia    Hypertension    Hypothyroidism    On home oxygen therapy    "2L; 9PM til 9AM" (06/27/2017)   PAD (peripheral artery disease) (HCC)    a. prior gangrene, toe amputations, intervention.   PAF (paroxysmal atrial fibrillation) ([place])    Paraesophageal hernia    Perforated gastric ulcer ([place])    Pneumonia    "a few times" (06/27/2017)   Seasonal allergies    SIADH (syndrome of inappropriate ADH production) ([place])    [name] 01/10/2015   Small bowel obstruction ([place])    "I don't know how many" (01/11/2015)   Stroke ([place])    several- no residual   Type II diabetes mellitus ([place])    "related to prednisone use  for > 20 yr; once predinose stopped; no more DM RX" (01/27/2016)   UTI (urinary tract infection) 02/08/2016   Ventral hernia with bowel obstruction     Assessment: 89 YOF status  post transmetatarsal amputation of the right consulted to restart home warfarin for Afib. Patient's last dose of warfarin was 8/8 as she was instructed to hold warfarin for 4 days prior to surgery. There was no bridge needed per MD. Patients most recent home dose is 7.5mg  in the evening on STh and 5mg  in the evening MTWFS per anticoagulation clinic notes and family member. Patient's most recent INR is 1.3 which is subtherapeutic. No reported signs and symptoms of bleeding per nurse. Patient not eating well, placed on osmolite and ensures for improved nutrition.   Goal of Therapy:  INR 2-3 Monitor platelets by anticoagulation protocol: Yes   Plan:  Warfarin 5mg  x1  Monitor daily INR and CBC  Monitor for signs of bleeding and change in oral  intake  Cephus Slater, PharmD, [facility] Resident [phone] 11/15/2019 8:42 AM

## 2019-11-15 NOTE — Discharge Summary (Signed)
Physician Discharge Summary  DILYN MCDAVITT ZOX:[id number] DOB: 27-May-1923 DOA: 11/14/2019  PCP: Sharon Seller, NP  Admit date: 11/14/2019 Discharge date: 11/15/2019  Admitted From: home Discharge disposition: home   Recommendations for Outpatient Follow-Up:   Follow up with Dr. Evans:Patient may be weightbearing in the surgical extremity in the postsurgical shoe.  Patient has a postsurgical shoe at home.  Follow-up within 1 week in office   Discharge Diagnosis:   Active Problems:   Essential hypertension, benign   GERD (gastroesophageal reflux disease)   Type 2 diabetes mellitus with chronic kidney disease (HCC)   Hypothyroidism   CKD (chronic kidney disease) stage 3, GFR 30-59 ml/min   Diabetic polyneuropathy associated with type 2 diabetes mellitus (HCC)   PEG (percutaneous endoscopic gastrostomy) status (HCC)   S/P amputation of lesser toe, left (HCC)   History of transmetatarsal amputation of right foot (HCC)    Discharge Condition: Improved.  Diet recommendation: resume prior  Wound care: leave dressing until office follow up  Code status: Full.   History of Present Illness:   86 y.o. female with PMHx of diabetes mellitus presenting today for follow up evaluation of chronic ulcerations of the right foot.  Patient presents with her daughter today because of most recently she has noticed an increase in pain with swelling of the second toe of the right foot.  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  She has noticed increased aches with swelling associated to the foot.  Just recently she noticed some pus draining from the distal ulceration site.  She presents for further treatment and evaluation   Hospital Course by Problem:   Status post transmetatarsal amputation right foot placed in overnight observation due to multiple comorbidities -no overnight issues      Discharge Exam:   Vitals:   11/15/19 0447 11/15/19  0814  BP: (!) 141/57 (!) 157/66  Pulse: 64 65  Resp: 17 17  Temp: 97.8 F (36.6 C) 98 F (36.7 C)  SpO2: 100% 98%   Vitals:   11/14/19 1836 11/15/19 0021 11/15/19 0447 11/15/19 0814  BP: (!) 180/66 (!) 145/63 (!) 141/57 (!) 157/66  Pulse: 68 71 64 65  Resp: 16 15 17 17   Temp: 97.9 F (36.6 C) 97.8 F (36.6 C) 97.8 F (36.6 C) 98 F (36.7 C)  TempSrc: Oral Oral Oral Oral  SpO2: 100% 100% 100% 98%  Weight:      Height:        General exam: Appears calm and comfortable..    The results of significant diagnostics from this hospitalization (including imaging, microbiology, ancillary and laboratory) are listed below for reference.     Procedures and Diagnostic Studies:   DG Foot Complete Right  Result Date: 11/14/2019 CLINICAL DATA:  Transmetatarsal amputation EXAM: RIGHT FOOT COMPLETE - 3+ VIEW COMPARISON:  10/20/2019 FINDINGS: Transmetatarsal amputation of the right foot. No additional bony abnormality. Soft tissue gas noted at the stump, likely postoperative. IMPRESSION: Transmetatarsal amputation.  No visible complicating feature. Electronically Signed   By: Charlett Nose M.D.   On: 11/14/2019 17:43     Labs:   Basic Metabolic Panel: Recent Labs  Lab 11/15/19 0506  NA 134*  K 4.3  CL 100  CO2 24  GLUCOSE 162*  BUN 37*  CREATININE 1.37*  CALCIUM 9.3  MG 2.1  PHOS 4.4   GFR Estimated Creatinine Clearance: 23.2 mL/min (A) (by C-G formula based on SCr of  1.37 mg/dL (H)). Liver Function Tests: Recent Labs  Lab 11/15/19 0506  AST 25  ALT 18  ALKPHOS 78  BILITOT 0.7  PROT 7.0  ALBUMIN 3.0*   No results for input(s): LIPASE, AMYLASE in the last 168 hours. No results for input(s): AMMONIA in the last 168 hours. Coagulation profile Recent Labs  Lab 11/14/19 1339 11/15/19 0506  INR 1.3* 1.3*    CBC: Recent Labs  Lab 11/15/19 0506  WBC 4.9  NEUTROABS 4.4  HGB 9.3*  HCT 27.7*  MCV 90.2  PLT 193   Cardiac Enzymes: No results for input(s):  CKTOTAL, CKMB, CKMBINDEX, TROPONINI in the last 168 hours. BNP: Invalid input(s): POCBNP CBG: Recent Labs  Lab 11/14/19 1228 11/14/19 1718 11/14/19 2109 11/15/19 0603  GLUCAP 98 97 153* 135*   D-Dimer No results for input(s): DDIMER in the last 72 hours. Hgb A1c No results for input(s): HGBA1C in the last 72 hours. Lipid Profile No results for input(s): CHOL, HDL, LDLCALC, TRIG, CHOLHDL, LDLDIRECT in the last 72 hours. Thyroid function studies No results for input(s): TSH, T4TOTAL, T3FREE, THYROIDAB in the last 72 hours.  Invalid input(s): FREET3 Anemia work up No results for input(s): VITAMINB12, FOLATE, FERRITIN, TIBC, IRON, RETICCTPCT in the last 72 hours. Microbiology No results found for this or any previous visit (from the past 240 hour(s)).   Discharge Instructions:   Discharge Instructions    Activity as tolerated - No restrictions   Complete by: As directed    Discharge instructions   Complete by: As directed    Resume tube feeds as prior   Discharge wound care:   Complete by: As directed    Per Dr. Logan Bores   Increase activity slowly   Complete by: As directed    No wound care   Complete by: As directed      Allergies as of 11/15/2019      Reactions   Penicillins Itching, Rash, Other (See Comments)   Tolerated amoxicillin, unasyn, zosyn & cephalosporins in the past. Did it involve swelling of the face/tongue/throat, SOB, or low BP? No Did it involve sudden or severe rash/hives, skin peeling, or any reaction on the inside of your mouth or nose? No Did you need to seek medical attention at a hospital or doctor's office? Unknown When did it last happen?5+ years If all above answers are "NO", may proceed with cephalosporin use.      Medication List    TAKE these medications   acetaminophen 500 MG tablet Commonly known as: TYLENOL Take 500-1,000 mg by mouth every 6 (six) hours as needed (pain).   amLODipine 5 MG tablet Commonly known as:  NORVASC Take 1 tablet (5 mg total) by mouth daily.   ARTIFICIAL TEARS OP Place 1 drop into both eyes 4 (four) times daily.   atorvastatin 10 MG tablet Commonly known as: LIPITOR TAKE 1 TABLET BY MOUTH EVERY DAY   b complex vitamins tablet Take 1 tablet by mouth daily.   benzocaine 20 % rectal ointment Commonly known as: AMERICAINE Place rectally every 3 (three) hours as needed for pain.   calcium-vitamin D 500-200 MG-UNIT tablet Commonly known as: OSCAL WITH D Take 1 tablet by mouth daily with breakfast.   cycloSPORINE 0.05 % ophthalmic emulsion Commonly known as: Restasis USE 1 DROP INTO BOTH EYES TWICE DAILY   Dulera 200-5 MCG/ACT Aero Generic drug: mometasone-formoterol TAKE TWO PUFFS BY MOUTH TWICE DAILY   EQL PROBIOTIC COLON SUPPORT PO Take 1 capsule by mouth daily.  feeding supplement (OSMOLITE 1.5 CAL) Liqd Place 711 mLs into feeding tube See admin instructions. Take 711 ml over a 12 hour period per day   feeding supplement (PRO-STAT SUGAR FREE 64) Liqd Take 30 mLs by mouth 2 (two) times daily.   ferrous sulfate 220 (44 Fe) MG/5ML solution TAKE 7.4 MLS (325 MG TOTAL) BY MOUTH DAILY.   fluticasone 50 MCG/ACT nasal spray Commonly known as: FLONASE SPRAY 2 SPRAYS INTO EACH NOSTRIL EVERY DAY   free water Soln Place 180 mLs into feeding tube 3 (three) times daily.   furosemide 40 MG tablet Commonly known as: LASIX Take 20-40 mg by mouth See admin instructions. Take 20 mg daily, may instead take 40 mg daily as needed for edema   furosemide 20 MG tablet Commonly known as: LASIX Take 1 tablet (20 mg total) by mouth daily.   gabapentin 250 MG/5ML solution Commonly known as: NEURONTIN TAKE 6 MLS (300 MG TOTAL) BY MOUTH 2 (TWO) TIMES DAILY.   gentamicin cream 0.1 % Commonly known as: GARAMYCIN Apply 1 application topically daily as needed (dry skin).   hydrALAZINE 50 MG tablet Commonly known as: APRESOLINE TAKE 1 TABLET BY MOUTH THREE TIMES A DAY    insulin aspart 100 UNIT/ML FlexPen Commonly known as: NOVOLOG Inject into the skin as needed for high blood sugar. New sliding Scale If BS is 151 - 251 = 2 units,  If BS is 251 -  350 = 4 units,  If BS is 351 - 450 = 6 units Call provider if BS runs more than 400   Insulin Pen Needle 31G X 5 MM Misc Commonly known as: B-D UF III MINI PEN NEEDLES Use twice daily with giving insulin injections. Dx E11.9   ipratropium-albuterol 0.5-2.5 (3) MG/3ML Soln Commonly known as: DUONEB Take 3 mLs by nebulization every 6 (six) hours as needed (shortness of breath).   isosorbide mononitrate 30 MG 24 hr tablet Commonly known as: IMDUR TAKE 2 TABLETS BY MOUTH DAILY. PLEASE MAKE ANNUAL APPT WITH DR. Delton See FOR FUTURE REFILLS   Lantus SoloStar 100 UNIT/ML Solostar Pen Generic drug: insulin glargine Inject 8 Units into the skin at bedtime.   latanoprost 0.005 % ophthalmic solution Commonly known as: XALATAN Place 1 drop into both eyes at bedtime.   levalbuterol 45 MCG/ACT inhaler Commonly known as: XOPENEX HFA Inhale 2 puffs into the lungs every 6 (six) hours as needed for wheezing.   levothyroxine 125 MCG tablet Commonly known as: SYNTHROID TAKE 1 TABLET BY MOUTH EVERY DAY   loratadine 10 MG tablet Commonly known as: CLARITIN Take 10 mg by mouth daily as needed for allergies.   multivitamin with minerals Tabs tablet Take 1 tablet by mouth daily.   nystatin powder Commonly known as: MYCOSTATIN/NYSTOP Apply 1 application topically as needed (skin irritation).   oxyCODONE 5 MG immediate release tablet Commonly known as: Roxicodone Take 1 tablet (5 mg total) by mouth every 4 (four) hours as needed for severe pain. May take 1/2 tab q4hrs as needed for mild pain.   pantoprazole 40 MG tablet Commonly known as: PROTONIX TAKE 1 TABLET BY MOUTH EVERY DAY   polyethylene glycol 17 g packet Commonly known as: MIRALAX / GLYCOLAX Take 17 g by mouth as needed.   senna-docusate 8.6-50 MG  tablet Commonly known as: Senokot-S Take 1 tablet by mouth as needed for mild constipation.   sertraline 50 MG tablet Commonly known as: ZOLOFT Take 50 mg by mouth daily. Pt currently taking 50 mg of  liquid, family noticed changes when the original 100 mg dose was reduced to 50. Family requested pt go back to 100 mg. Pt will continue to take 50 mg qd of the liquid until she runs out then switch to the 100 mg tablets. What changed: Another medication with the same name was removed. Continue taking this medication, and follow the directions you see here.   Systane Nighttime Oint Place 1 application into both eyes at bedtime.   warfarin 2.5 MG tablet Commonly known as: COUMADIN Take as directed. If you are unsure how to take this medication, talk to your nurse or doctor. Original instructions: Take 5-7.5 mg by mouth See admin instructions. Take 7.5 mg in the evening on Sun and Thurs, take 5 mg in the evening on Mon, Tue, Wed, Fri, and Sat            Discharge Care Instructions  (From admission, onward)         Start     Ordered   11/15/19 0000  Discharge wound care:       Comments: Per Dr. Logan Bores   11/15/19 1034            Time coordinating discharge: 25 min  Signed:  Joseph Art DO  [facility] 11/15/2019, 10:35 AM

## 2019-11-15 NOTE — Progress Notes (Signed)
   HPI: Patient underwent transmetatarsal amputation right foot secondary to osteomyelitis.  DOS: 11/14/2019.  Patient seen this morning and doing very well.  She is alert and oriented.  She is in very good spirits today.  Past Medical History:  Diagnosis Date  . Acute respiratory failure ([place])   . Anemia    previous blood transfusions  . Arthritis    "all over"  . Asthma   . Atrial flutter ([place])   . Bradycardia    requiring previous d/c of BB and reduction of amiodarone  . CAD (coronary artery disease)    nonobstructive per notes  . Chronic diastolic CHF (congestive heart failure) ([place])   . CKD (chronic kidney disease), stage III   . Complication of blood transfusion    "got the wrong blood type at [facility] in ~ 2015; no adverse reaction that we are aware of"/daughter, [name] (01/27/2016)  . COPD (chronic obstructive pulmonary disease) ([place])   . Depression    "light case"  . DVT (deep venous thrombosis) ([place]) 01/2016   a. LLE DVT 01/2016 - switched from Eliquis to Coumadin.  [name] Dyspnea    with some activity  . Gastric stenosis    a. s/p stomach tube  . GERD (gastroesophageal reflux disease)   . Headache   . History of 2019 novel coronavirus disease (COVID-19)   . History of blood transfusion    "several" (01/27/2016)  . History of stomach ulcers   . Hyperlipidemia   . Hypertension   . Hypothyroidism   . On home oxygen therapy    "2L; 9PM til 9AM" (06/27/2017)  . PAD (peripheral artery disease) ([place])    a. prior gangrene, toe amputations, intervention.  [name] PAF (paroxysmal atrial fibrillation) ([place])   . Paraesophageal hernia   . Perforated gastric ulcer ([place])   . Pneumonia    "a few times" (06/27/2017)  . Seasonal allergies   . SIADH (syndrome of inappropriate ADH production) ([place])    [name] 01/10/2015  . Small bowel obstruction ([place])    "I don't know how many" (01/11/2015)  . Stroke [facility])    several- no residual  . Type II diabetes mellitus ([place])    "related to  prednisone use  for > 20 yr; once predinose stopped; no more DM RX" (01/27/2016)  . UTI (urinary tract infection) 02/08/2016  . Ventral hernia with bowel obstruction      Physical Exam: The dressing to the surgical extremity is clean and dry and intact.  Dressings left intact.  Assessment: -Status post transmetatarsal amputation right foot placed in overnight observation due to multiple comorbidities   Plan of Care:  -Patient doing very well this morning.  From a surgical standpoint the patient is okay to be discharged. -Discharge orders placed -Patient may be weightbearing in the surgical extremity in the postsurgical shoe.  Patient has a postsurgical shoe at home -Follow-up within 1 week in office      Edrick Kins, DPM [facility]  Dr. Edrick Kins, DPM    [address], [place] [address]                Office [phone]  Fax [phone]

## 2019-11-15 NOTE — Progress Notes (Signed)
Initial Nutrition Assessment  RD working remotely.  DOCUMENTATION CODES:   Not applicable  INTERVENTION:   -Ensure Enlive po BID, each supplement provides 350 kcal and 20 grams of protein -MVI with minerals daily -Initiate nocturnal feedings:    Osmolite 1.2 @ 60 ml/hr via j-port over 12 hour period (2100-0900)  45 ml Prosource TF BID.    150 ml free water flush TID  Tube feeding regimen provides 944 kcal (61% of needs), 62 grams of protein, and 590 ml of H2O. Total free water: 1040 ml daily  NUTRITION DIAGNOSIS:   Increased nutrient needs related to post-op healing as evidenced by estimated needs.  GOAL:   Patient will meet greater than or equal to 90% of their needs  MONITOR:   Supplement acceptance, PO intake, Labs, Weight trends, Skin, I & O's  REASON FOR ASSESSMENT:   Consult Enteral/tube feeding initiation and management  ASSESSMENT:   86 y.o. female with PMHx of diabetes mellitus presenting today for follow up evaluation of chronic ulcerations of the right foot.  Pt admitted with ulcerations to second, third, and fourth toes on rt foot.   8/13- s/p Procedure(s): TRANSMETATARSAL AMPUTATION RIGHT FOOT (Right)  Reviewed I/O's: -710 ml x 24 hours  UOP: 1 L x 24 hours  Attempted to speak with pt via phone call to hospital room, however, no answer.  RD familiar with pt due to multiple previous admissions. Pt consumes a full liquid diet at home in addition to nocturnal tube feedings. Per previous RD notes, pt's home TF regimen is Osmolite 1.2 @ 60 ml/hr over 12 hours (2100-0900), 30 ml Prostat BID, 150 ml free water flush TID. Regimen provides 1064 kcals, 70 grams protein, and 1040 ml free water daily, meeting 69% of estimated kcal needs and 93% of estimated protein needs.   Reviewed wt hx; pt has experienced a 8.3% wt loss over the past 6 months. While this is not significant for time frame, it is concerning given advanced age and j-tube dependence due to  pyloric stenosis.    Lab Results  Component Value Date   HGBA1C 6.3 (H) 10/23/2019   PTA DM medications are 8 units insulin glargine daily.   Labs reviewed: Na: 134, CBGS: 97-153 (inpatient orders for glycemic control are 0-9 units insulin aspart TID with meals and 8 units insulin glargine daily at bedtime).   Diet Order:   Diet Order            Diet full liquid Room service appropriate? Yes; Fluid consistency: Thin  Diet effective now                 EDUCATION NEEDS:   No education needs have been identified at this time  Skin:  Skin Assessment: Skin Integrity Issues: Skin Integrity Issues:: Incisions Incisions: closed rt foot  Last BM:  Unknown  Height:   Ht Readings from Last 1 Encounters:  11/14/19 5\' 6"  (1.676 m)    Weight:   Wt Readings from Last 1 Encounters:  11/14/19 58.5 kg    Ideal Body Weight:  59.1 kg  BMI:  Body mass index is 20.82 kg/m.  Estimated Nutritional Needs:   Kcal:  1550-1750  Protein:  75-90 grams  Fluid:  > 1.5 L    Loistine Chance, RD, LDN, [name] Registered Dietitian II Certified Diabetes Care and Education Specialist Please refer to [facility] for RD and/or RD on-call/weekend/after hours pager

## 2019-11-15 NOTE — Progress Notes (Signed)
NT removed IV and telemetry  Pt discharge education provided at bedside with pt and pt daughter Pt has all belongings  Pt discharged via wheelchair with NT

## 2019-11-15 NOTE — Evaluation (Signed)
Physical Therapy Evaluation Patient Details Name: Shannon [name] MRN: [id number] DOB: Apr 08, 1923 Today's Date: 11/15/2019   History of Present Illness  The pt is a 86 yo female presenting s/p R transmet amputation on 8/13. PMH includes: arthritis, CAD, CHF, CKD III, COPD, DVT (in 2017), HLD, HTN, PAD, afib, and DM II with peripheral neuropathy.  Clinical Impression  Pt in bed upon arrival of PT, agreeable to evaluation at this time. Prior to admission the pt was independent with use of a rollator in the home for mobility, and receiving some assist from her daughter for ADLs such as bathing and food preparations. The pt now presents with minor limitations in functional mobility, endurance, strength, and dynamic stability due to above dx and chronically reduced activity. She is safe to d/c home with her daughter for assistance as needed, but would also benefit from HHPT to improve strength, stability, and increased independence with mobility in the home. The pt was able to demo good independence with bed mobility, but benefits from minG for transfers and short ambulation (25 ft) with RW at this time.       Follow Up Recommendations Home health PT;Supervision for mobility/OOB    Equipment Recommendations  None recommended by PT    Recommendations for Other Services       Precautions / Restrictions Precautions Precautions: Fall Required Braces or Orthoses: Other Brace Other Brace: post op shoe Restrictions Weight Bearing Restrictions: Yes Other Position/Activity Restrictions: per surgeon: "the pt can bear weight in a post surgical shoe"      Mobility  Bed Mobility Overal bed mobility: Modified Independent             General bed mobility comments: increased time, no assist given  Transfers Overall transfer level: Needs assistance Equipment used: Rolling walker (2 wheeled) Transfers: Sit to/from Stand Sit to Stand: Min guard         General transfer comment: minG for  safety, VC for hand placement, pt able to slowly rise to standing  Ambulation/Gait Ambulation/Gait assistance: Min guard Gait Distance (Feet): 25 Feet Assistive device: Rolling walker (2 wheeled) Gait Pattern/deviations: Step-to pattern;Decreased stride length;Decreased stance time - right;Decreased weight shift to right;Antalgic   Gait velocity interpretation: <1.31 ft/sec, indicative of household ambulator General Gait Details: pt with decreased wt bearing on R functionally, heavy reliance on BUE     Balance Overall balance assessment: Needs assistance Sitting-balance support: Single extremity supported;Feet supported Sitting balance-Leahy Scale: Fair     Standing balance support: Bilateral upper extremity supported;During functional activity Standing balance-Leahy Scale: Poor Standing balance comment: reliant on BUE support                             Pertinent Vitals/Pain Pain Assessment: No/denies pain    Home Living Family/patient expects to be discharged to:: Private residence Living Arrangements: Children Available Help at Discharge: Family;Available 24 hours/day Type of Home: House Home Access: Ramped entrance     Home Layout: One level Home Equipment: Walker - 4 wheels;Bedside commode;Tub bench;Hand held shower head      Prior Function Level of Independence: Needs assistance   Gait / Transfers Assistance Needed: rollator for short ambulation distances   ADL's / Homemaking Assistance Needed: pt reorts independence with ADLs, daughter assists with medication managmenet, shower transfers, and cooking        Hand Dominance   Dominant Hand: Right    Extremity/Trunk Assessment   Upper Extremity Assessment Upper  Extremity Assessment: Overall WFL for tasks assessed    Lower Extremity Assessment Lower Extremity Assessment: Generalized weakness (able to use functionally and hold against mod resistance, but generalized atrophy)    Cervical /  Trunk Assessment Cervical / Trunk Assessment: Normal  Communication   Communication: HOH (with hearing aides)  Cognition Arousal/Alertness: Awake/alert Behavior During Therapy: WFL for tasks assessed/performed Overall Cognitive Status: Within Functional Limits for tasks assessed                                        General Comments General comments (skin integrity, edema, etc.): VSS on RA, pt verbalized understanding of all precautions    Exercises     Assessment/Plan    PT Assessment Patient needs continued PT services  PT Problem List Decreased strength;Decreased mobility;Decreased activity tolerance;Decreased balance;Pain       PT Treatment Interventions DME instruction;Therapeutic exercise;Gait training;Stair training;Functional mobility training;Therapeutic activities;Patient/family education;Balance training    PT Goals (Current goals can be found in the Care Plan section)  Acute Rehab PT Goals Patient Stated Goal: return home to continue working on her knitting projects PT Goal Formulation: With patient Time For Goal Achievement: 11/29/19 Potential to Achieve Goals: Good    Frequency Min 3X/week   Barriers to discharge           AM-PAC PT "6 Clicks" Mobility  Outcome Measure Help needed turning from your back to your side while in a flat bed without using bedrails?: None Help needed moving from lying on your back to sitting on the side of a flat bed without using bedrails?: A Little Help needed moving to and from a bed to a chair (including a wheelchair)?: A Little Help needed standing up from a chair using your arms (e.g., wheelchair or bedside chair)?: A Little Help needed to walk in hospital room?: A Little Help needed climbing 3-5 steps with a railing? : A Little 6 Click Score: 19    End of Session Equipment Utilized During Treatment: Gait belt Activity Tolerance: Patient tolerated treatment well Patient left: in bed;with call  bell/phone within reach;with bed alarm set Nurse Communication: Mobility status (lack of purewick and pt ability to use BSC) PT Visit Diagnosis: Muscle weakness (generalized) (M62.81);Difficulty in walking, not elsewhere classified (R26.2)    Time: 4403-4742 PT Time Calculation (min) (ACUTE ONLY): 29 min   Charges:   PT Evaluation $PT Eval Moderate Complexity: 1 Mod PT Treatments $Gait Training: 8-22 mins        Rolm Baptise, PT, DPT   Acute Rehabilitation Department Pager #: [phone]  Gaetana Michaelis 11/15/2019, 11:07 AM

## 2019-11-16 ENCOUNTER — Encounter (HOSPITAL_COMMUNITY): Payer: Self-pay | Admitting: Podiatry

## 2019-11-17 NOTE — Op Note (Signed)
OPERATIVE REPORT Patient name: Shannon [name] MRN: [id number] DOB: Nov 04, 1923  DOS:  11/14/2019  Preop Dx: Osteomyelitis right foot Postop Dx: same  Procedure:  1.  Transmetatarsal amputation right foot  Surgeon: Felecia Shelling DPM  Anesthesia: Regional leg block with monitored anesthesia care  Hemostasis: Ankle tourniquet inflated to a pressure of after esmarch exsanguination   EBL: 50 mL Materials: None Injectables: None Pathology: None  Condition: The patient tolerated the procedure and anesthesia well. No complications noted or reported   Justification for procedure: The patient is a 86 y.o. female past medical history of diabetes mellitus who presents today for surgical correction of chronic osteomyelitis to the second and third digits of the right foot. All conservative modalities of been unsuccessful in providing any sort of satisfactory alleviation of symptoms with the patient. The patient was told benefits as well as possible side effects of the surgery. The patient consented for surgical correction. The patient consent form was reviewed. All patient questions were answered. No guarantees were expressed or implied. The patient and the surgeon boson the patient consent form with the witness present and placed in the patient's chart.   Procedure in Detail: The patient was brought to the operating room, placed in the operating table in the supine position at which time an aseptic scrub and drape were performed about the patient's respective lower extremity after anesthesia was induced as described above. Attention was then directed to the surgical area where procedure number one commenced.  Procedure #1: Transmetatarsal amputation right foot A fishmouth type incision was planned and made overlying the distal portion of the right foot.  The incision was carried down to the level of bone and metatarsals with care taken to cut clamp ligate and retract away all small  neurovascular structures traversing the incision site.  Soft tissue was reflected away from the distal portion of the metatarsals in preparation for the ensuing osteotomy.  Osteotomy was created at the distal third of the metatarsals 2 through 5 of the right foot.  The patient had prior partial first ray amputation.  Osteotomy was created in the distal portion of the forefoot was removed in toto.  The osteotomies were verified by intraoperative x-ray fluoroscopy.  Any tendon that was visualized within the amputation site was distracted distally and cut as far proximal as could be visualized.  Copious irrigation was then utilized in preparation for primary closure.  4-0 Prolene suture was utilized to reapproximate superficial skin edges.  Dry sterile compressive dressings were then applied to all previously mentioned incision sites about the patient's lower extremity. The tourniquet which was used for hemostasis was deflated.  No active excessive bleeding was noted after the tourniquet was deflated. The patient was then transferred from the operating room to the recovery room having tolerated the procedure and anesthesia well. All vital signs are stable. After a brief stay in the recovery room the patient was placed in overnight observation as planned.  The surgery was scheduled for overnight observation due to the patient's age and nutritional status as well as several comorbidities.  Verbal as well as written instructions were provided for the patient regarding wound care. The patient is to keep the dressings clean dry and intact until they are to follow surgeon Dr. Gala Lewandowsky in the office upon discharge.   Felecia Shelling, DPM [facility]  Dr. Felecia Shelling, DPM    [address]  [place], [place] [address]                Office [phone]  Fax [phone]

## 2019-11-18 ENCOUNTER — Other Ambulatory Visit: Payer: Self-pay | Admitting: *Deleted

## 2019-11-18 NOTE — Patient Outreach (Signed)
[facility]) Care Management  11/18/2019  Shannon [name] 07-Mar-1924 [id number]   Member was admitted briefly on 8/13 for transmetatarsal amputation of the right foot.  Call placed to member's daughter to follow up on recovery, state this is not a good time for her to talk but request this care manager call member directly on her cell phone.  Call placed to member's cell phone, no answer, unable to leave message as mailbox is full.  Will follow up within the next 3-4 business days.  [name], [place], MSN [place] [phone]

## 2019-11-19 ENCOUNTER — Ambulatory Visit (INDEPENDENT_AMBULATORY_CARE_PROVIDER_SITE_OTHER): Payer: Medicare Other | Admitting: Podiatry

## 2019-11-19 ENCOUNTER — Other Ambulatory Visit: Payer: Self-pay | Admitting: Cardiology

## 2019-11-19 ENCOUNTER — Other Ambulatory Visit: Payer: Self-pay

## 2019-11-19 ENCOUNTER — Ambulatory Visit (INDEPENDENT_AMBULATORY_CARE_PROVIDER_SITE_OTHER): Payer: Medicare Other

## 2019-11-19 DIAGNOSIS — L97512 Non-pressure chronic ulcer of other part of right foot with fat layer exposed: Secondary | ICD-10-CM

## 2019-11-19 DIAGNOSIS — E0859 Diabetes mellitus due to underlying condition with other circulatory complications: Secondary | ICD-10-CM

## 2019-11-19 DIAGNOSIS — I739 Peripheral vascular disease, unspecified: Secondary | ICD-10-CM

## 2019-11-19 NOTE — Progress Notes (Signed)
Subjective:  Patient presents today status post transmetatarsal amputation of the right foot. DOS: 11/14/2019.  Patient is doing very well.  She only takes pain medication 2 times daily according to her daughter.  She is kept the dressings clean dry and intact and is minimally weightbearing.  No new complaints at this time  Past Medical History:  Diagnosis Date  . Acute respiratory failure ([place])   . Anemia    previous blood transfusions  . Arthritis    "all over"  . Asthma   . Atrial flutter ([place])   . Bradycardia    requiring previous d/c of BB and reduction of amiodarone  . CAD (coronary artery disease)    nonobstructive per notes  . Chronic diastolic CHF (congestive heart failure) ([place])   . CKD (chronic kidney disease), stage III   . Complication of blood transfusion    "got the wrong blood type at [facility] in ~ 2015; no adverse reaction that we are aware of"/daughter, [name] (01/27/2016)  . COPD (chronic obstructive pulmonary disease) ([place])   . Depression    "light case"  . DVT (deep venous thrombosis) ([place]) 01/2016   a. LLE DVT 01/2016 - switched from Eliquis to Coumadin.  [name] Dyspnea    with some activity  . Gastric stenosis    a. s/p stomach tube  . GERD (gastroesophageal reflux disease)   . Headache   . History of 2019 novel coronavirus disease (COVID-19)   . History of blood transfusion    "several" (01/27/2016)  . History of stomach ulcers   . Hyperlipidemia   . Hypertension   . Hypothyroidism   . On home oxygen therapy    "2L; 9PM til 9AM" (06/27/2017)  . PAD (peripheral artery disease) ([place])    a. prior gangrene, toe amputations, intervention.  [name] PAF (paroxysmal atrial fibrillation) ([place])   . Paraesophageal hernia   . Perforated gastric ulcer ([place])   . Pneumonia    "a few times" (06/27/2017)  . Seasonal allergies   . SIADH (syndrome of inappropriate ADH production) ([place])    [name] 01/10/2015  . Small bowel obstruction ([place])    "I don't know how many"  (01/11/2015)  . Stroke [facility])    several- no residual  . Type II diabetes mellitus ([place])    "related to prednisone use  for > 20 yr; once predinose stopped; no more DM RX" (01/27/2016)  . UTI (urinary tract infection) 02/08/2016  . Ventral hernia with bowel obstruction       Objective/Physical Exam Neurovascular status intact.  Skin incisions appear to be well coapted with sutures intact. No sign of infectious process noted. No dehiscence.  There is some very minimal bleeding/drainage to the lateral portion of the incision site.  The patient is on anticoagulant therapy so this is somewhat expected.  Otherwise the incision site in the foot appears very well and I do suspect good healing of the amputation stump.  Minimal edema noted to the surgical extremity.  Radiographic Exam:  Osteotomies sites appear to be stable with routine healing.  Assessment: 1. s/p transmetatarsal amputation right foot. DOS: 11/14/2019   Plan of Care:  1. Patient was evaluated. X-rays reviewed 2.  Dressings changed today.  Clean dry and intact x1 week. 3.  Continue minimal weightbearing in the postsurgical shoe 4.  Return to clinic in 1 week   Edrick Kins, DPM [facility]  Dr. Edrick Kins, DPM    [address]  [place], [place] [address]                Office [phone]  Fax [phone]

## 2019-11-21 ENCOUNTER — Other Ambulatory Visit: Payer: Self-pay | Admitting: *Deleted

## 2019-11-21 NOTE — Patient Outreach (Signed)
[facility]) Care Management  11/21/2019  ACSA ESTEY [birth year] [id number]   Outreach attempt #2, successful.  Call placed to member to follow up on surgery on 8/13, state she is "doing pretty good with the help of the Lord."  Report she had follow up appointment with surgeon on 8/18, he feels confident that she is recovering well.  She will have a follow up appointment next week, 8/25.  This care manager inquired about dressing changes, state she was told not to change dressing but instead wait until her office visit.  Report she is compliant with weight restrictions.  She is managing her blood sugars well, which she is aware will help with wound healing.  Blood sugar today was 164.  She denies any urgent concerns, daughter remains in the home for monitoring of member's condition and assistance.  Encouraged to contact this care manager with questions.  Will follow up within the next month.  Goals Addressed              This Visit's Progress   .  Recover from foot surgery (pt-stated)        CARE PLAN ENTRY (see longitudinal plan of care for additional care plan information)  Current Barriers:  [name] Knowledge Deficits related to Foot surgery, transmetatarsal amputation  Nurse Case Manager Clinical Goal(s):  [name] Over the next 28 days, patient will verbalize understanding of plan for decreased infection and increased recovery . Over the next 31 days, patient will not experience hospital admission. Hospital Admissions in last 6 months = 1 . Over the next 28 days, patient will attend all scheduled medical appointments: surgeon/foot center  Interventions:  . Inter-disciplinary care team collaboration (see longitudinal plan of care) . Discussed plans with patient for ongoing care management follow up and provided patient with direct contact information for care management team  Patient Self Care Activities:  . Performs ADL's independently . Unable to perform ADLs  independently  Initial goal documentation       [name], RN, MSN [place] [phone]

## 2019-11-26 ENCOUNTER — Ambulatory Visit (INDEPENDENT_AMBULATORY_CARE_PROVIDER_SITE_OTHER): Payer: Medicare Other | Admitting: Podiatry

## 2019-11-26 ENCOUNTER — Other Ambulatory Visit: Payer: Self-pay

## 2019-11-26 ENCOUNTER — Ambulatory Visit (INDEPENDENT_AMBULATORY_CARE_PROVIDER_SITE_OTHER): Payer: Medicare Other | Admitting: *Deleted

## 2019-11-26 DIAGNOSIS — Z5181 Encounter for therapeutic drug level monitoring: Secondary | ICD-10-CM

## 2019-11-26 DIAGNOSIS — I739 Peripheral vascular disease, unspecified: Secondary | ICD-10-CM

## 2019-11-26 DIAGNOSIS — I4891 Unspecified atrial fibrillation: Secondary | ICD-10-CM

## 2019-11-26 DIAGNOSIS — I4892 Unspecified atrial flutter: Secondary | ICD-10-CM

## 2019-11-26 DIAGNOSIS — E0859 Diabetes mellitus due to underlying condition with other circulatory complications: Secondary | ICD-10-CM

## 2019-11-26 DIAGNOSIS — Z9889 Other specified postprocedural states: Secondary | ICD-10-CM

## 2019-11-26 DIAGNOSIS — L97512 Non-pressure chronic ulcer of other part of right foot with fat layer exposed: Secondary | ICD-10-CM

## 2019-11-26 LAB — POCT INR: INR: 1.7 — AB (ref 2.0–3.0)

## 2019-11-26 NOTE — Patient Instructions (Addendum)
Description   Take 3 tablets today and then continue on same dosage 2 tablets daily except 3 tablets on Sundays and Thursdays. Recheck in 2 weeks. Call Coumadin Clinic with any new medications [phone].

## 2019-11-27 DIAGNOSIS — Z20822 Contact with and (suspected) exposure to covid-19: Secondary | ICD-10-CM | POA: Diagnosis not present

## 2019-12-03 ENCOUNTER — Other Ambulatory Visit: Payer: Self-pay | Admitting: Nurse Practitioner

## 2019-12-03 DIAGNOSIS — D638 Anemia in other chronic diseases classified elsewhere: Secondary | ICD-10-CM

## 2019-12-07 IMAGING — XA IR REPLACE G/J TUBE W/ FLUORO
1 series · 4 of 4 positions shown · non-contrast
Comparison: none

INDICATION: Gastrojejunostomy tube exchange

[Series 300: tube placements · 4 of 4 slices shown]
[im 1/4]
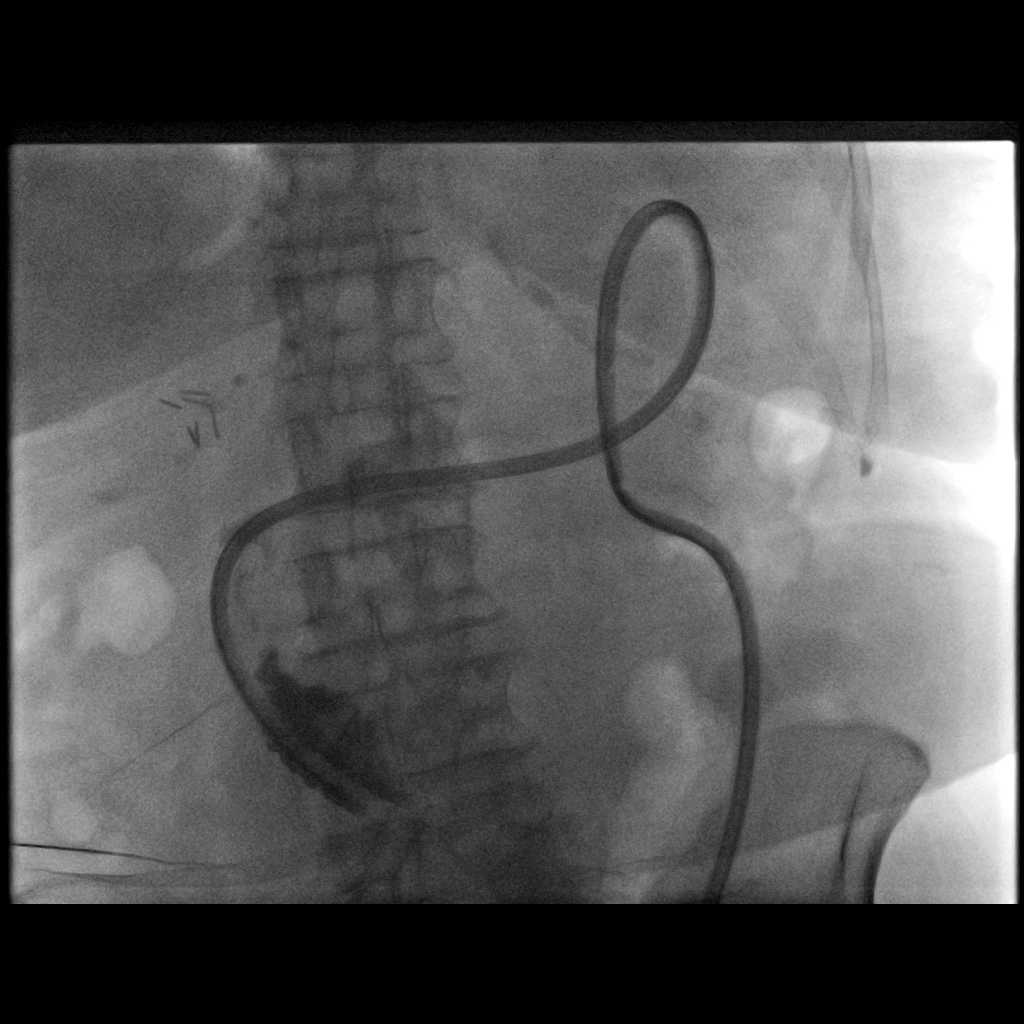
[im 2/4]
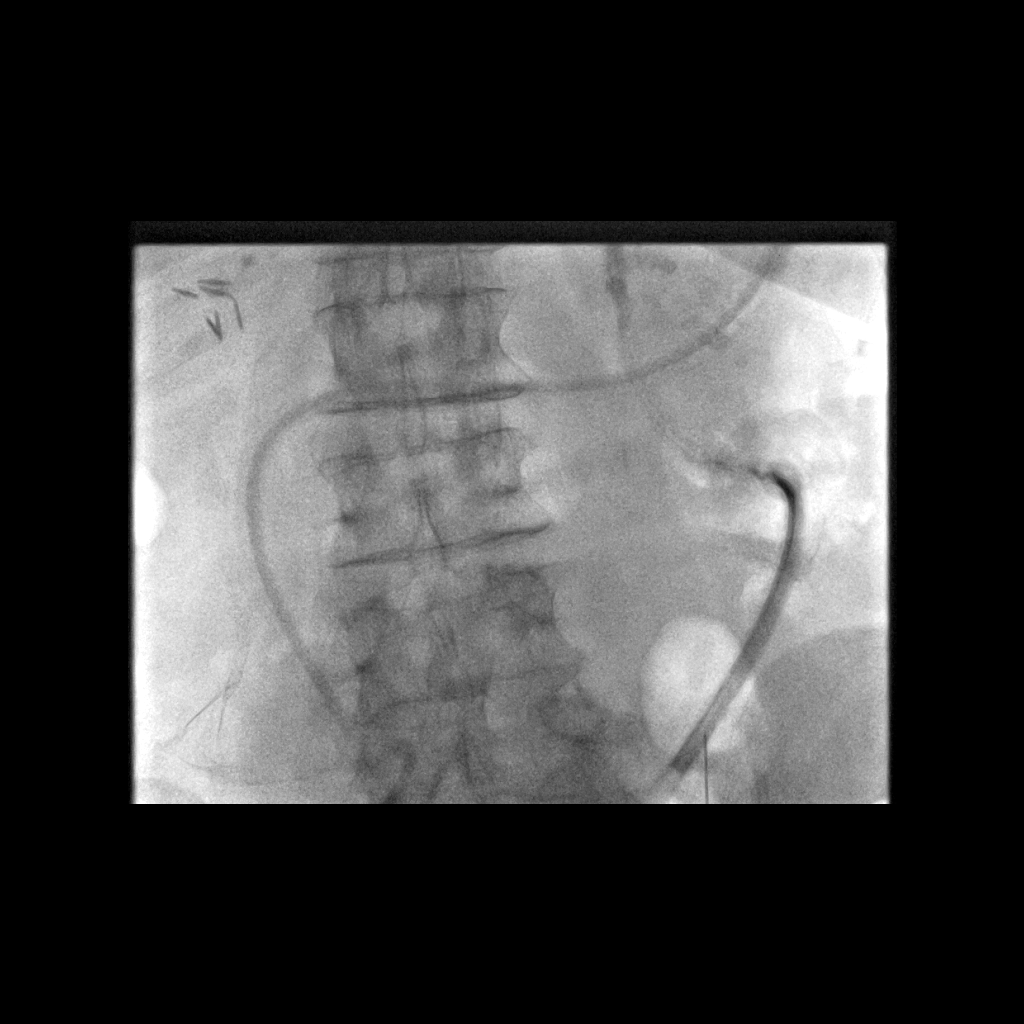
[im 3/4]
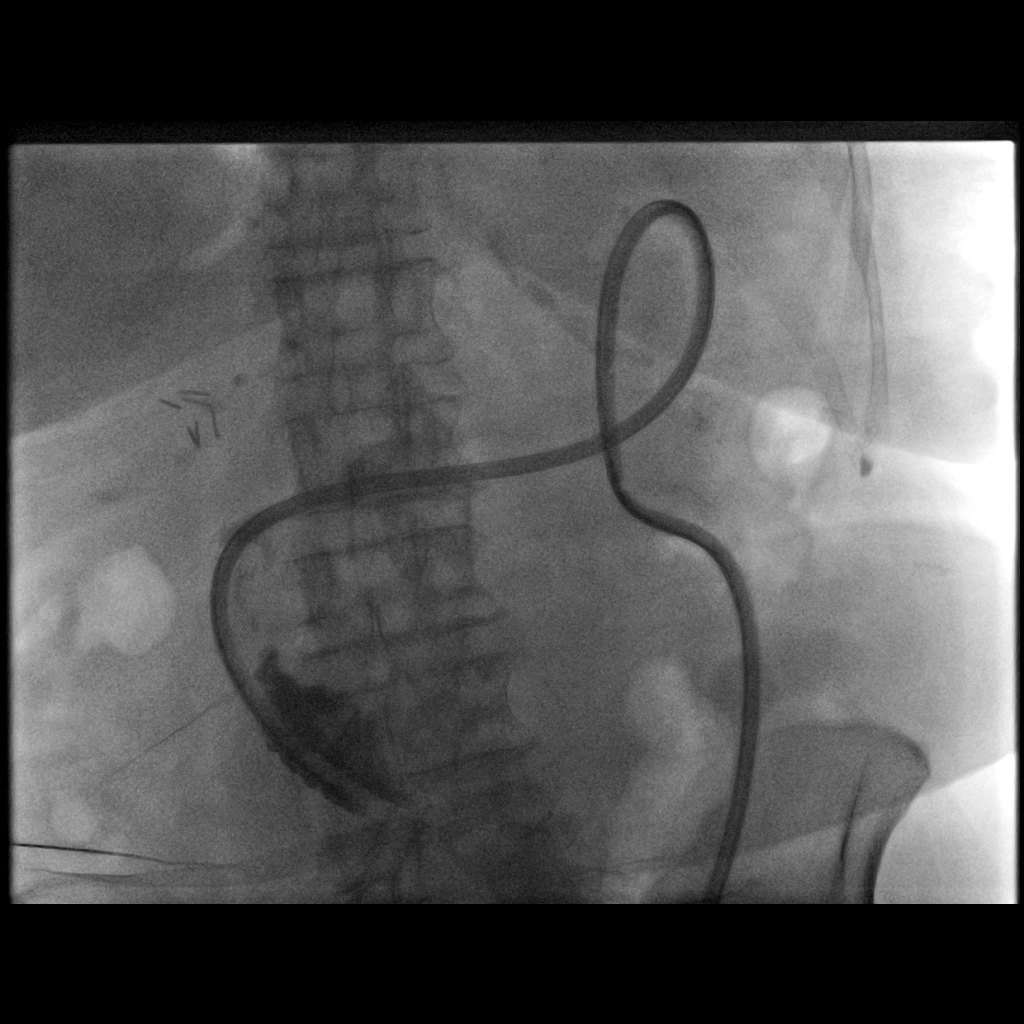
[im 4/4]
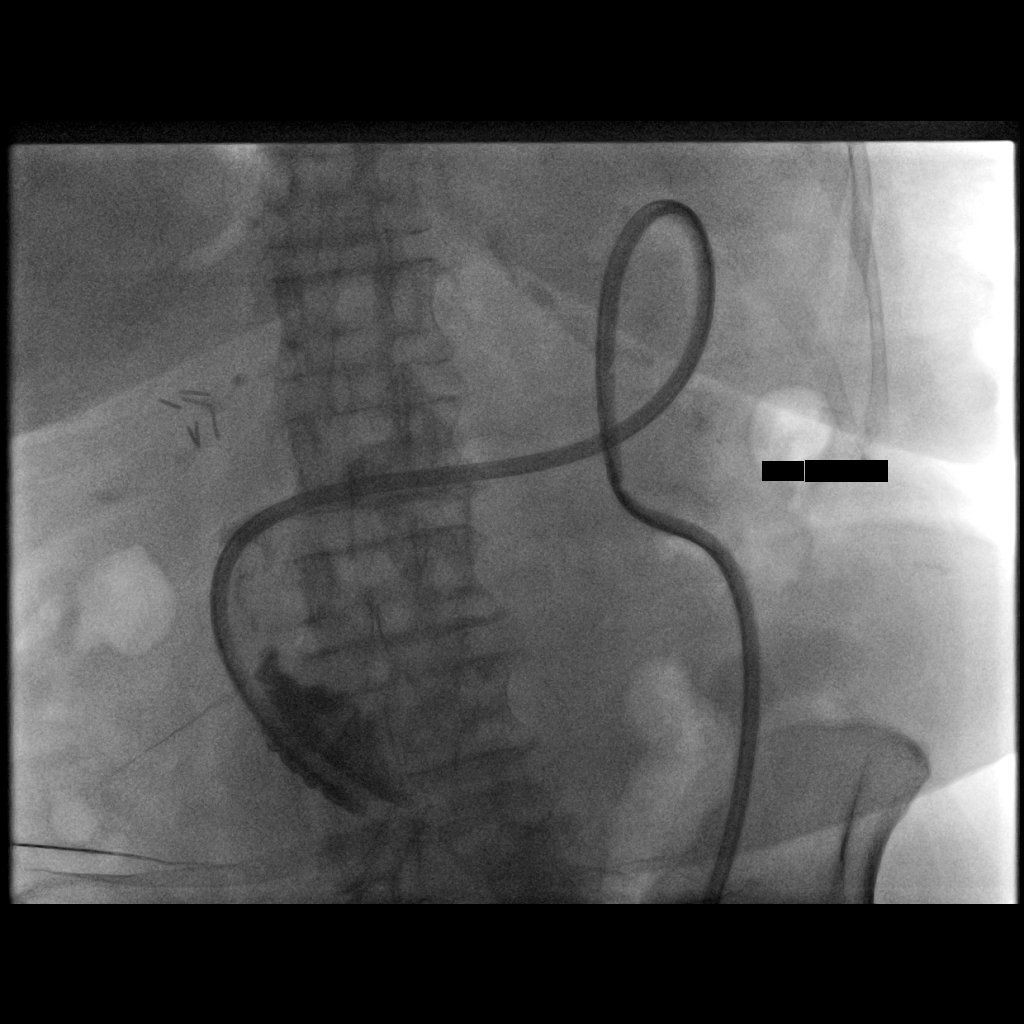

[4 of 4 positions shown; findings below may reference images not displayed]

EXAM:
GASTROJEJUNOSTOMY TUBE EXCHANGE

MEDICATIONS:
None

ANESTHESIA/SEDATION:
None

CONTRAST:  5 cc Isovue 3-administered into the gastric lumen.

FLUOROSCOPY TIME:  Fluoroscopy Time: 3 minutes 42 seconds (41 mGy).

COMPLICATIONS:
None immediate.

PROCEDURE:
Informed written consent was obtained from the patient after a
thorough discussion of the procedural risks, benefits and
alternatives. All questions were addressed. Maximal Sterile Barrier
Technique was utilized including caps, mask, sterile gowns, sterile
gloves, sterile drape, hand hygiene and skin antiseptic. A timeout
was performed prior to the initiation of the procedure.

The existing gastrojejunostomy tube was exchanged over a glidewire
for a new 22 French gastrojejunostomy tube the tip is positioned in
the duodenum. Contrast was injected. The balloon was insufflated
with 8 cc saline
FINDINGS: Tip of the jejunostomy tube is in the duodenum.
IMPRESSION: Successful 22 French gastrojejunostomy tube exchange. Tip is in the
duodenum and it is ready for use.

## 2019-12-09 ENCOUNTER — Ambulatory Visit (INDEPENDENT_AMBULATORY_CARE_PROVIDER_SITE_OTHER): Payer: Medicare Other | Admitting: Cardiology

## 2019-12-09 ENCOUNTER — Encounter: Payer: Self-pay | Admitting: Cardiology

## 2019-12-09 ENCOUNTER — Other Ambulatory Visit: Payer: Self-pay

## 2019-12-09 ENCOUNTER — Ambulatory Visit (INDEPENDENT_AMBULATORY_CARE_PROVIDER_SITE_OTHER): Payer: Medicare Other | Admitting: *Deleted

## 2019-12-09 VITALS — BP 174/78 | HR 68 | Ht 66.0 in | Wt 133.8 lb

## 2019-12-09 DIAGNOSIS — I70235 Atherosclerosis of native arteries of right leg with ulceration of other part of foot: Secondary | ICD-10-CM | POA: Diagnosis not present

## 2019-12-09 DIAGNOSIS — I10 Essential (primary) hypertension: Secondary | ICD-10-CM

## 2019-12-09 DIAGNOSIS — I480 Paroxysmal atrial fibrillation: Secondary | ICD-10-CM | POA: Diagnosis not present

## 2019-12-09 DIAGNOSIS — I5032 Chronic diastolic (congestive) heart failure: Secondary | ICD-10-CM

## 2019-12-09 DIAGNOSIS — Z5181 Encounter for therapeutic drug level monitoring: Secondary | ICD-10-CM

## 2019-12-09 DIAGNOSIS — I4891 Unspecified atrial fibrillation: Secondary | ICD-10-CM

## 2019-12-09 DIAGNOSIS — I4892 Unspecified atrial flutter: Secondary | ICD-10-CM

## 2019-12-09 LAB — POCT INR: INR: 1.9 — AB (ref 2.0–3.0)

## 2019-12-09 NOTE — Patient Instructions (Signed)
Description   Start taking warfarin 2 tablets daily except 3 tablets on Sundays Tuesdays and Thursdays. Recheck in 3 weeks. Call Coumadin Clinic with any new medications [phone].

## 2019-12-09 NOTE — Progress Notes (Signed)
Subjective:  Patient presents today status post transmetatarsal amputation of the right foot. DOS: 11/14/2019.  Patient is doing very well.  Patient is no longer taking any pain medication.  The pain is very minimal.  She is kept the dressings clean dry and intact since last visit.  No new complaints at this time.  She is currently ambulating well in the postsurgical shoe.  Past Medical History:  Diagnosis Date  . Acute respiratory failure ([place])   . Anemia    previous blood transfusions  . Arthritis    "all over"  . Asthma   . Atrial flutter ([place])   . Bradycardia    requiring previous d/c of BB and reduction of amiodarone  . CAD (coronary artery disease)    nonobstructive per notes  . Chronic diastolic CHF (congestive heart failure) ([place])   . CKD (chronic kidney disease), stage III   . Complication of blood transfusion    "got the wrong blood type at [facility] in ~ 2015; no adverse reaction that we are aware of"/daughter, [name] (01/27/2016)  . COPD (chronic obstructive pulmonary disease) ([place])   . Depression    "light case"  . DVT (deep venous thrombosis) ([place]) 01/2016   a. LLE DVT 01/2016 - switched from Eliquis to Coumadin.  [name] Dyspnea    with some activity  . Gastric stenosis    a. s/p stomach tube  . GERD (gastroesophageal reflux disease)   . Headache   . History of 2019 novel coronavirus disease (COVID-19)   . History of blood transfusion    "several" (01/27/2016)  . History of stomach ulcers   . Hyperlipidemia   . Hypertension   . Hypothyroidism   . On home oxygen therapy    "2L; 9PM til 9AM" (06/27/2017)  . PAD (peripheral artery disease) ([place])    a. prior gangrene, toe amputations, intervention.  [name] PAF (paroxysmal atrial fibrillation) ([place])   . Paraesophageal hernia   . Perforated gastric ulcer ([place])   . Pneumonia    "a few times" (06/27/2017)  . Seasonal allergies   . SIADH (syndrome of inappropriate ADH production) ([place])    [name] 01/10/2015  . Small bowel  obstruction ([place])    "I don't know how many" (01/11/2015)  . Stroke [facility])    several- no residual  . Type II diabetes mellitus ([place])    "related to prednisone use  for > 20 yr; once predinose stopped; no more DM RX" (01/27/2016)  . UTI (urinary tract infection) 02/08/2016  . Ventral hernia with bowel obstruction       Objective/Physical Exam Neurovascular status intact.  Skin incisions appear to be well coapted with sutures intact. No sign of infectious process noted. No dehiscence.  Negative for any bleeding or drainage to the incision site.  Incision appears very dry and well coapted.  Negative for any significant edema noted to the surgical foot  Assessment: 1. s/p transmetatarsal amputation right foot. DOS: 11/14/2019   Plan of Care:  1. Patient was evaluated.  2.  Dressings changed today.  Clean dry and intact x1 week. 3.  Continue minimal weightbearing in the postsurgical shoe 4.  Return to clinic in 1 week for suture removal   Edrick Kins, DPM [facility]  Dr. Edrick Kins, [address]  [place], [place] [address]                Office [phone]  Fax [phone]

## 2019-12-09 NOTE — Progress Notes (Signed)
Cardiology Office Note    Date:  12/09/2019   ID:  Shannon [name], DOB 1924-01-17, MRN [id number]  PCP:  Sharon Seller, NP  Cardiologist: Tobias Alexander, MD EPS: None  Reason for visit: 3 months follow up  History of Present Illness:  Shannon [name] is a 86 y.o. female with history of chronic diastolic CHF, paroxysmal atrial fibrillation on Coumadin, gastric outlet obstruction s/p gastrojejunostomy feeding tube nonobstructive CAD, history of CVA, COPD, CKD stage III, insulin-dependent type 2 diabetes, hypertension, hyperlipidemia, anemia of chronic disease, lower extremity PAD s/p bilateral interventions followed by VVS,hypothyroidism, and depression   Have the leg and thyroid not started for $5 Patient admitted with syncope 11/24/2018 possible secondary to bradycardia-coreg stopped. Troponins normal, CT negative, echo normal LVEF. Norvasc added for BP control. Not seen by Cardiology. Readmitted 12/26/18 with BRBPR in the setting of constipation felt to be a hemorrhoidal bleed. Hbg stable 10.1 coumadin continued.   Carotid dopplers were stable, 30 day monitor without pauses or arrhthymias, . Crt up 1.61 so lasix was reduced 20 mg daily and legs began to sell. LOV 02/19/19 was getting canned soup/broth. I increased lasix to 20mg /40 mg alternating days.   09/10/2019 -the patient was hospitalized on May 26 with chest pain that was determined to be musculoskeletal.  Has been doing well since then, her daughter states that she moves around all day with her walker around her house.  She denies any chest pain or shortness of breath with that.  No recent syncope or falls.  She continues to be fed with a feeding tube.  Her weight has been stable.  She has no lower extremity edema no orthopnea proximal nocturnal dyspnea.  No bleeding with Coumadin.  12/09/2019 -the patient is coming after 3 months, she has been doing great, except for amputation of her toes by her podiatrist on her right lower extremity,  she continues to walk with a walker and has not had any falls or syncope.  She denies any bleeding, no recurrent chest pain or shortness of breath.  No lower extremity edema.  Past Medical History:  Diagnosis Date  . Acute respiratory failure (HCC)   . Anemia    previous blood transfusions  . Arthritis    "all over"  . Asthma   . Atrial flutter (HCC)   . Bradycardia    requiring previous d/c of BB and reduction of amiodarone  . CAD (coronary artery disease)    nonobstructive per notes  . Chronic diastolic CHF (congestive heart failure) (HCC)   . CKD (chronic kidney disease), stage III   . Complication of blood transfusion    "got the wrong blood type at [facility] in ~ 2015; no adverse reaction that we are aware of"/daughter, [name] (01/27/2016)  . COPD (chronic obstructive pulmonary disease) (HCC)   . Depression    "light case"  . DVT (deep venous thrombosis) (HCC) 01/2016   a. LLE DVT 01/2016 - switched from Eliquis to Coumadin.  [name] Dyspnea    with some activity  . Gastric stenosis    a. s/p stomach tube  . GERD (gastroesophageal reflux disease)   . Headache   . History of 2019 novel coronavirus disease (COVID-19)   . History of blood transfusion    "several" (01/27/2016)  . History of stomach ulcers   . Hyperlipidemia   . Hypertension   . Hypothyroidism   . On home oxygen therapy    "2L; 9PM til 9AM" (06/27/2017)  .  PAD (peripheral artery disease) (HCC)    a. prior gangrene, toe amputations, intervention.  [name] PAF (paroxysmal atrial fibrillation) (HCC)   . Paraesophageal hernia   . Perforated gastric ulcer (HCC)   . Pneumonia    "a few times" (06/27/2017)  . Seasonal allergies   . SIADH (syndrome of inappropriate ADH production) (HCC)    [name] 01/10/2015  . Small bowel obstruction (HCC)    "I don't know how many" (01/11/2015)  . Stroke [facility])    several- no residual  . Type II diabetes mellitus (HCC)    "related to prednisone use  for > 20 yr; once predinose stopped;  no more DM RX" (01/27/2016)  . UTI (urinary tract infection) 02/08/2016  . Ventral hernia with bowel obstruction     Past Surgical History:  Procedure Laterality Date  . ABDOMINAL AORTOGRAM N/A 09/21/2016   Procedure: Abdominal Aortogram;  Surgeon: Maeola Harman, MD;  Location: [facility] INVASIVE CV LAB;  Service: Cardiovascular;  Laterality: N/A;  . AMPUTATION Left 09/25/2016   Procedure: AMPUTATION DIGIT- LEFT 2ND AND 3RD TOES;  Surgeon: Larina Earthly, MD;  Location: [facility] OR;  Service: Vascular;  Laterality: Left;  . AMPUTATION Right 05/03/2018   Procedure: AMPUTATION RIGHT GREAT TOE;  Surgeon: Larina Earthly, MD;  Location: [facility] OR;  Service: Vascular;  Laterality: Right;  . CATARACT EXTRACTION W/ INTRAOCULAR LENS  IMPLANT, BILATERAL    . CHOLECYSTECTOMY OPEN    . COLECTOMY    . ESOPHAGOGASTRODUODENOSCOPY N/A 01/19/2014   Procedure: ESOPHAGOGASTRODUODENOSCOPY (EGD);  Surgeon: Hilarie Fredrickson, MD;  Location: [facility] ENDOSCOPY;  Service: Endoscopy;  Laterality: N/A;  . ESOPHAGOGASTRODUODENOSCOPY N/A 01/20/2014   Procedure: ESOPHAGOGASTRODUODENOSCOPY (EGD);  Surgeon: Hilarie Fredrickson, MD;  Location: [facility] ENDOSCOPY;  Service: Endoscopy;  Laterality: N/A;  . ESOPHAGOGASTRODUODENOSCOPY N/A 03/19/2014   Procedure: ESOPHAGOGASTRODUODENOSCOPY (EGD);  Surgeon: Rachael Fee, MD;  Location: [facility] ENDOSCOPY;  Service: Endoscopy;  Laterality: N/A;  . ESOPHAGOGASTRODUODENOSCOPY N/A 07/08/2015   Procedure: ESOPHAGOGASTRODUODENOSCOPY (EGD);  Surgeon: Sherrilyn Rist, MD;  Location: [facility] ENDOSCOPY;  Service: Endoscopy;  Laterality: N/A;  . ESOPHAGOGASTRODUODENOSCOPY (EGD) WITH PROPOFOL N/A 09/15/2015   Procedure: ESOPHAGOGASTRODUODENOSCOPY (EGD) WITH PROPOFOL;  Surgeon: Ruffin Frederick, MD;  Location: [facility] ENDOSCOPY;  Service: Gastroenterology;  Laterality: N/A;  . GASTROJEJUNOSTOMY     hx/notes 01/10/2015  . GASTROJEJUNOSTOMY N/A 09/23/2015   Procedure: OPEN GASTROJEJUNOSTOMY TUBE PLACEMENT;  Surgeon: De Blanch  Kinsinger, MD;  Location: [facility] ORS;  Service: General;  Laterality: N/A;  . GLAUCOMA SURGERY Bilateral   . HERNIA REPAIR  2015  . IR CM INJ ANY COLONIC TUBE W/FLUORO  01/05/2017  . IR CM INJ ANY COLONIC TUBE W/FLUORO  06/07/2017  . IR CM INJ ANY COLONIC TUBE W/FLUORO  11/05/2017  . IR CM INJ ANY COLONIC TUBE W/FLUORO  09/18/2018  . IR CM INJ ANY COLONIC TUBE W/FLUORO  03/06/2019  . IR CM INJ ANY COLONIC TUBE W/FLUORO  03/11/2019  . IR GENERIC HISTORICAL  01/07/2016   IR GJ TUBE CHANGE 01/07/2016 Malachy Moan, MD [facility]  . IR GENERIC HISTORICAL  01/27/2016   IR MECH REMOV OBSTRUC MAT ANY COLON TUBE W/FLUORO 01/27/2016 Richarda Overlie, MD [facility]  . IR GENERIC HISTORICAL  02/07/2016   IR PATIENT EVAL TECH 0-60 MINS Darrell K Allred, PA-C [facility]  . IR GENERIC HISTORICAL  02/08/2016   IR GJ TUBE CHANGE 02/08/2016 Berdine Dance, MD [facility]  . IR GENERIC HISTORICAL  01/06/2016   IR GJ TUBE CHANGE  01/06/2016 [facility] OUT REF  . IR GENERIC HISTORICAL  05/02/2016   IR CM INJ ANY COLONIC TUBE W/FLUORO 05/02/2016 Oley Balm, MD [facility]  . IR GENERIC HISTORICAL  05/15/2016   IR GJ TUBE CHANGE 05/15/2016 Simonne Come, MD [facility]  . IR GENERIC HISTORICAL  06/28/2016   IR GJ TUBE CHANGE 06/28/2016 [facility]  . IR GJ TUBE CHANGE  02/20/2017  . IR GJ TUBE CHANGE  05/10/2017  . IR GJ TUBE CHANGE  07/06/2017  . IR GJ TUBE CHANGE  08/02/2017  . IR GJ TUBE CHANGE  10/15/2017  . IR GJ TUBE CHANGE  12/26/2017  . IR GJ TUBE CHANGE  04/08/2018  . IR GJ TUBE CHANGE  06/19/2018  . IR GJ TUBE CHANGE  03/03/2019  . IR GJ TUBE CHANGE  06/06/2019  . IR GJ TUBE CHANGE  09/26/2019  . IR PATIENT EVAL TECH 0-60 MINS  10/19/2016  . IR PATIENT EVAL TECH 0-60 MINS  12/25/2016  . IR PATIENT EVAL TECH 0-60 MINS  01/29/2017  . IR PATIENT EVAL TECH 0-60 MINS  04/04/2017  . IR PATIENT EVAL TECH 0-60 MINS  04/30/2017  . IR PATIENT EVAL TECH 0-60 MINS  07/31/2017  . IR REPLC DUODEN/JEJUNO TUBE PERCUT W/FLUORO  11/14/2016   . LAPAROTOMY N/A 01/20/2015   Procedure: EXPLORATORY LAPAROTOMY;  Surgeon: Abigail Miyamoto, MD;  Location: [facility] OR;  Service: General;  Laterality: N/A;  . LOWER EXTREMITY ANGIOGRAPHY Left 09/21/2016   Procedure: Lower Extremity Angiography;  Surgeon: Maeola Harman, MD;  Location: [facility] INVASIVE CV LAB;  Service: Cardiovascular;  Laterality: Left;  . LOWER EXTREMITY ANGIOGRAPHY Right 06/27/2017   Procedure: Lower Extremity Angiography;  Surgeon: Maeola Harman, MD;  Location: [facility] INVASIVE CV LAB;  Service: Cardiovascular;  Laterality: Right;  . LYSIS OF ADHESION N/A 01/20/2015   Procedure: LYSIS OF ADHESIONS < 1 HOUR;  Surgeon: Abigail Miyamoto, MD;  Location: [facility] OR;  Service: General;  Laterality: N/A;  . PERIPHERAL VASCULAR BALLOON ANGIOPLASTY Left 09/21/2016   Procedure: Peripheral Vascular Balloon Angioplasty;  Surgeon: Maeola Harman, MD;  Location: [facility] INVASIVE CV LAB;  Service: Cardiovascular;  Laterality: Left;  drug coated balloon  . PERIPHERAL VASCULAR BALLOON ANGIOPLASTY Right 06/27/2017   Procedure: PERIPHERAL VASCULAR BALLOON ANGIOPLASTY;  Surgeon: Maeola Harman, MD;  Location: [facility] INVASIVE CV LAB;  Service: Cardiovascular;  Laterality: Right;  SFA/TPTRUNK  . TONSILLECTOMY    . TRANSMETATARSAL AMPUTATION Right 11/14/2019   Procedure: TRANSMETATARSAL AMPUTATION RIGHT FOOT;  Surgeon: Felecia Shelling, DPM;  Location: [facility] OR;  Service: Podiatry;  Laterality: Right;  . TUBAL LIGATION    . VENTRAL HERNIA REPAIR  2015   incarcerated ventral hernia ([facility] 09/2013)/notes 01/10/2015    Current Medications: Current Meds  Medication Sig  . acetaminophen (TYLENOL) 500 MG tablet Take 500-1,000 mg by mouth every 6 (six) hours as needed (pain).   . Amino Acids-Protein Hydrolys (FEEDING SUPPLEMENT, PRO-STAT SUGAR FREE 64,) LIQD Take 30 mLs by mouth 2 (two) times daily.  [name] amLODipine (NORVASC) 5 MG tablet Take 1 tablet (5 mg total) by mouth daily.  [name] atorvastatin  (LIPITOR) 10 MG tablet TAKE 1 TABLET BY MOUTH EVERY DAY  . b complex vitamins tablet Take 1 tablet by mouth daily.   . benzocaine (AMERICAINE) 20 % rectal ointment Place rectally every 3 (three) hours as needed for pain.  . calcium-vitamin D (OSCAL WITH D) 500-200 MG-UNIT per tablet Take 1 tablet by mouth daily with breakfast.   .  Carboxymethylcellulose Sodium (ARTIFICIAL TEARS OP) Place 1 drop into both eyes 4 (four) times daily.  . cycloSPORINE (RESTASIS) 0.05 % ophthalmic emulsion USE 1 DROP INTO BOTH EYES TWICE DAILY  . DULERA 200-5 MCG/ACT AERO TAKE TWO PUFFS BY MOUTH TWICE DAILY  . ferrous sulfate 220 (44 Fe) MG/5ML solution TAKE 7.4 MLS (325 MG TOTAL) BY MOUTH DAILY.  . fluticasone (FLONASE) 50 MCG/ACT nasal spray SPRAY 2 SPRAYS INTO EACH NOSTRIL EVERY DAY  . furosemide (LASIX) 20 MG tablet Take 1 tablet (20 mg total) by mouth daily.  . furosemide (LASIX) 40 MG tablet Take 20-40 mg by mouth See admin instructions. Take 20 mg daily, may instead take 40 mg daily as needed for edema  . gabapentin (NEURONTIN) 250 MG/5ML solution TAKE 6 MLS (300 MG TOTAL) BY MOUTH 2 (TWO) TIMES DAILY.  [name] gentamicin cream (GARAMYCIN) 0.1 % Apply 1 application topically daily as needed (dry skin).  . hydrALAZINE (APRESOLINE) 50 MG tablet TAKE 1 TABLET BY MOUTH THREE TIMES A DAY  . insulin aspart (NOVOLOG) 100 UNIT/ML FlexPen Inject into the skin as needed for high blood sugar. New sliding Scale If BS is 151 - 251 = 2 units,  If BS is 251 -  350 = 4 units,  If BS is 351 - 450 = 6 units Call provider if BS runs more than 400  . Insulin Glargine (LANTUS SOLOSTAR) 100 UNIT/ML Solostar Pen Inject 8 Units into the skin at bedtime.  . Insulin Pen Needle (B-D UF III MINI PEN NEEDLES) 31G X 5 MM MISC Use twice daily with giving insulin injections. Dx E11.9  . ipratropium-albuterol (DUONEB) 0.5-2.5 (3) MG/3ML SOLN Take 3 mLs by nebulization every 6 (six) hours as needed (shortness of breath).  . isosorbide mononitrate  (IMDUR) 30 MG 24 hr tablet TAKE 2 TABLETS BY MOUTH DAILY. PLEASE MAKE ANNUAL APPT WITH DR. Delton See FOR FUTURE REFILLS  . latanoprost (XALATAN) 0.005 % ophthalmic solution Place 1 drop into both eyes at bedtime.  . levalbuterol (XOPENEX HFA) 45 MCG/ACT inhaler Inhale 2 puffs into the lungs every 6 (six) hours as needed for wheezing.  [name] levothyroxine (SYNTHROID) 125 MCG tablet TAKE 1 TABLET BY MOUTH EVERY DAY  . loratadine (CLARITIN) 10 MG tablet Take 10 mg by mouth daily as needed for allergies.  . Multiple Vitamin (MULTIVITAMIN WITH MINERALS) TABS tablet Take 1 tablet by mouth daily.  . Nutritional Supplements (FEEDING SUPPLEMENT, OSMOLITE 1.5 CAL,) LIQD Place 711 mLs into feeding tube See admin instructions. Take 711 ml over a 12 hour period per day  . nystatin (MYCOSTATIN/NYSTOP) powder Apply 1 application topically as needed (skin irritation).  [name] oxyCODONE (ROXICODONE) 5 MG immediate release tablet Take 1 tablet (5 mg total) by mouth every 4 (four) hours as needed for severe pain. May take 1/2 tab q4hrs as needed for mild pain.  . pantoprazole (PROTONIX) 40 MG tablet TAKE 1 TABLET BY MOUTH EVERY DAY  . polyethylene glycol (MIRALAX / GLYCOLAX) 17 g packet Take 17 g by mouth as needed.  . Probiotic Product (EQL PROBIOTIC COLON SUPPORT PO) Take 1 capsule by mouth daily.  [name] senna-docusate (SENOKOT-S) 8.6-50 MG tablet Take 1 tablet by mouth as needed for mild constipation.  . sertraline (ZOLOFT) 50 MG tablet Take 50 mg by mouth daily. Pt currently taking 50 mg of liquid, family noticed changes when the original 100 mg dose was reduced to 50. Family requested pt go back to 100 mg. Pt will continue to take 50 mg qd of  the liquid until she runs out then switch to the 100 mg tablets.  . warfarin (COUMADIN) 2.5 MG tablet TAKE 2-3 TABLETS BY MOUTH DAILY AS DIRECTED BY COUMADIN CLINIC.  [name] Water For Irrigation, Sterile (FREE WATER) SOLN Place 180 mLs into feeding tube 3 (three) times daily.   Cliffton Asters  Petrolatum-Mineral Oil (SYSTANE NIGHTTIME) OINT Place 1 application into both eyes at bedtime.      Allergies:   Penicillins   Social History   Socioeconomic History  . Marital status: Widowed    Spouse name: Not on file  . Number of children: 3  . Years of education: Not on file  . Highest education level: Not on file  Occupational History  . Not on file  Tobacco Use  . Smoking status: Never Smoker  . Smokeless tobacco: Former Neurosurgeon    Types: Snuff  . Tobacco comment: "used snuff in my younger days"  Vaping Use  . Vaping Use: Never used  Substance and Sexual Activity  . Alcohol use: Never    Alcohol/week: 0.0 standard drinks  . Drug use: Never  . Sexual activity: Not Currently  Other Topics Concern  . Not on file  Social History Narrative   Married in [place], lives in one story house with 2 people and no pets   Occupation: ?   Has a living Will, [place], and doesn't have DNR   Was living with Husband (in frail health) in [place] until 09/2013.  After her [facility] admission they both came to live with daughter in [place]   Social Determinants of Health   Financial Resource Strain:   . Difficulty of Paying Living Expenses: Not on file  Food Insecurity: No Food Insecurity  . Worried About Programme researcher, broadcasting/film/video in the Last Year: Never true  . Ran Out of Food in the Last Year: Never true  Transportation Needs: No Transportation Needs  . Lack of Transportation (Medical): No  . Lack of Transportation (Non-Medical): No  Physical Activity:   . Days of Exercise per Week: Not on file  . Minutes of Exercise per Session: Not on file  Stress:   . Feeling of Stress : Not on file  Social Connections:   . Frequency of Communication with Friends and Family: Not on file  . Frequency of Social Gatherings with Friends and Family: Not on file  . Attends Religious Services: Not on file  . Active Member of Clubs or Organizations: Not on file  . Attends Banker Meetings: Not on  file  . Marital Status: Not on file    Family History:  The patient's family history includes Diabetes in her brother; Glaucoma in her son and son; Hypertension in her mother; Stroke in her mother.   ROS:   Please see the history of present illness.    ROS All other systems reviewed and are negative.  PHYSICAL EXAM:    VS:  BP (!) 174/78   Pulse 68   Ht 5\' 6"  (1.676 m)   Wt 133 lb 12.8 oz (60.7 kg)   SpO2 98%   BMI 21.60 kg/m   Physical Exam  GEN: Thin, elderly, in no acute distress  Neck: no JVD, carotid bruits, or masses Cardiac:RRR; 2/6 systolic murmur LSB Respiratory:  clear to auscultation bilaterally, normal work of breathing GI: soft, nontender, nondistended, + BS Ext: right foot in boot, minimal swelling  Neuro:  Alert and Oriented x 3 Psych: euthymic mood, full affect  Wt Readings from  Last 3 Encounters:  12/09/19 133 lb 12.8 oz (60.7 kg)  11/14/19 129 lb (58.5 kg)  11/07/19 133 lb 12.8 oz (60.7 kg)     Studies/Labs Reviewed:   EKG:  EKG is ordered today.   Shows normal sinus rhythm 66 bpm, normal EKG unchanged from prior.  Recent Labs: 07/07/2019: B Natriuretic Peptide 242.4 08/28/2019: TSH 1.428 11/15/2019: ALT 18; BUN 37; Creatinine, Ser 1.37; Hemoglobin 9.3; Magnesium 2.1; Platelets 193; Potassium 4.3; Sodium 134   Lipid Panel    Component Value Date/Time   CHOL 131 10/15/2018 1122   CHOL 126 04/07/2015 1015   TRIG 188 (H) 10/15/2018 1122   HDL 47 (L) 10/15/2018 1122   HDL 69 04/07/2015 1015   CHOLHDL 2.8 10/15/2018 1122   VLDL 26 12/24/2013 0456   LDLCALC 58 10/15/2018 1122   Additional studies/ records that were reviewed today include:  2D echo 05/19/2019   1. Left ventricular ejection fraction, by estimation, is 65 to 70%. The  left ventricle has normal function. The left ventricle has no regional  wall motion abnormalities. There is mild concentric left ventricular  hypertrophy. Left ventricular diastolic  parameters are consistent with Grade I  diastolic dysfunction (impaired  relaxation).   2. Right ventricular systolic function is normal. The right ventricular  size is normal. There is normal pulmonary artery systolic pressure. The  estimated right ventricular systolic pressure is 31.4 mmHg.   3. The mitral valve is degenerative. Trivial mitral valve regurgitation.   4. The aortic valve has an indeterminant number of cusps. Aortic valve  regurgitation is trivial. Mild aortic valve sclerosis is present, with no  evidence of aortic valve stenosis.   5. The inferior vena cava is normal in size with <50% respiratory  variability, suggesting right atrial pressure of 8 mmHg.   Comparison(s): A prior study was performed on 11/25/2018. No significant  change from prior study.   Carotid Dopplers 2/2021Summary:  Right Carotid: Velocities in the right ICA are consistent with a 1-39%  stenosis.   Left Carotid: Velocities in the left ICA are consistent with a 1-39%  stenosis.   Vertebrals: Bilateral vertebral arteries demonstrate antegrade flow.    ASSESSMENT:    1. PAF (paroxysmal atrial fibrillation) (HCC)   2. Essential hypertension, benign   3. Chronic diastolic CHF (congestive heart failure) (HCC)     PLAN:   In order of problems listed above:  Recent hospitalization with chest pain, determined to be musculoskeletal, now asymptomatic, will continue same management.  PAF with syncope 11/2018 felt secondary to bradycardia and Coreg stopped.  30-day monitor without significant arrhythmia. NSR on EKG in hospital, in sinus rhythm today.  She has no bleeding, her INR today was 1.9 and warfarin is being managed by our Coumadin clinic.  Essential hypertension -repeat BP today 137/75.  No orthostatic hypotension.  Chronic diastolic CHF normal LVEF with grade 1 diastolic dysfunction on echo 05/2019, euvolemic today.   GI bleed felt secondary to hemorrhoids 12/2018 continues on Coumadin, most recent INR 2.4 hemoglobin  9.5.  Medication Adjustments/Labs and Tests Ordered: Current medicines are reviewed at length with the patient today.  Concerns regarding medicines are outlined above.  Medication changes, Labs and Tests ordered today are listed in the Patient Instructions below. Patient Instructions  Medication Instructions:  Your physician recommends that you continue on your current medications as directed. Please refer to the Current Medication list given to you today.  *If you need a refill on your cardiac medications before your  next appointment, please call your pharmacy*   Lab Work: None  If you have labs (blood work) drawn today and your tests are completely normal, you will receive your results only by: [name] MyChart Message (if you have MyChart) OR . A paper copy in the mail If you have any lab test that is abnormal or we need to change your treatment, we will call you to review the results.   Testing/Procedures: None   Follow-Up: At [facility], you and your health needs are our priority.  As part of our continuing mission to provide you with exceptional heart care, we have created designated Provider Care Teams.  These Care Teams include your primary Cardiologist (physician) and Advanced Practice Providers (APPs -  Physician Assistants and Nurse Practitioners) who all work together to provide you with the care you need, when you need it.   Your next appointment:   6 month(s)  The format for your next appointment:   In Person  Provider:    Dr Delton See        Signed, Tobias Alexander, MD  12/09/2019 1:25 PM    [facility] [address] [place], [place], [place]  [address] Phone: [phone]; Fax: [phone]

## 2019-12-09 NOTE — Patient Instructions (Signed)
Medication Instructions:  Your physician recommends that you continue on your current medications as directed. Please refer to the Current Medication list given to you today.  *If you need a refill on your cardiac medications before your next appointment, please call your pharmacy*   Lab Work: None  If you have labs (blood work) drawn today and your tests are completely normal, you will receive your results only by: [name] MyChart Message (if you have MyChart) OR . A paper copy in the mail If you have any lab test that is abnormal or we need to change your treatment, we will call you to review the results.   Testing/Procedures: None   Follow-Up: At [facility], you and your health needs are our priority.  As part of our continuing mission to provide you with exceptional heart care, we have created designated Provider Care Teams.  These Care Teams include your primary Cardiologist (physician) and Advanced Practice Providers (APPs -  Physician Assistants and Nurse Practitioners) who all work together to provide you with the care you need, when you need it.   Your next appointment:   6 month(s)  The format for your next appointment:   In Person  Provider:    Dr Meda Coffee

## 2019-12-10 ENCOUNTER — Ambulatory Visit (INDEPENDENT_AMBULATORY_CARE_PROVIDER_SITE_OTHER): Payer: Medicare Other | Admitting: Podiatry

## 2019-12-10 DIAGNOSIS — E0859 Diabetes mellitus due to underlying condition with other circulatory complications: Secondary | ICD-10-CM

## 2019-12-10 DIAGNOSIS — Z9889 Other specified postprocedural states: Secondary | ICD-10-CM

## 2019-12-10 NOTE — Progress Notes (Signed)
Subjective:  Patient presents today status post transmetatarsal amputation of the right foot. DOS: 11/14/2019.  Patient states that she is doing very well.  She has been weightbearing in the postsurgical shoe as directed.  She also uses a walker for assistance.  She denies pain.  No new complaints at this time.  Past Medical History:  Diagnosis Date  . Acute respiratory failure ([place])   . Anemia    previous blood transfusions  . Arthritis    "all over"  . Asthma   . Atrial flutter ([place])   . Bradycardia    requiring previous d/c of BB and reduction of amiodarone  . CAD (coronary artery disease)    nonobstructive per notes  . Chronic diastolic CHF (congestive heart failure) ([place])   . CKD (chronic kidney disease), stage III   . Complication of blood transfusion    "got the wrong blood type at [facility] in ~ 2015; no adverse reaction that we are aware of"/daughter, [name] (01/27/2016)  . COPD (chronic obstructive pulmonary disease) ([place])   . Depression    "light case"  . DVT (deep venous thrombosis) ([place]) 01/2016   a. LLE DVT 01/2016 - switched from Eliquis to Coumadin.  [name] Dyspnea    with some activity  . Gastric stenosis    a. s/p stomach tube  . GERD (gastroesophageal reflux disease)   . Headache   . History of 2019 novel coronavirus disease (COVID-19)   . History of blood transfusion    "several" (01/27/2016)  . History of stomach ulcers   . Hyperlipidemia   . Hypertension   . Hypothyroidism   . On home oxygen therapy    "2L; 9PM til 9AM" (06/27/2017)  . PAD (peripheral artery disease) ([place])    a. prior gangrene, toe amputations, intervention.  [name] PAF (paroxysmal atrial fibrillation) ([place])   . Paraesophageal hernia   . Perforated gastric ulcer ([place])   . Pneumonia    "a few times" (06/27/2017)  . Seasonal allergies   . SIADH (syndrome of inappropriate ADH production) ([place])    [name] 01/10/2015  . Small bowel obstruction ([place])    "I don't know how many" (01/11/2015)  .  Stroke [facility])    several- no residual  . Type II diabetes mellitus ([place])    "related to prednisone use  for > 20 yr; once predinose stopped; no more DM RX" (01/27/2016)  . UTI (urinary tract infection) 02/08/2016  . Ventral hernia with bowel obstruction       Objective/Physical Exam Neurovascular status intact.  Skin incisions appear to be well coapted with sutures intact. No sign of infectious process noted. No dehiscence.  Negative for any bleeding or drainage to the incision site.  Incision appears very dry and well coapted.  Negative for any significant edema noted to the surgical foot  Assessment: 1. s/p transmetatarsal amputation right foot. DOS: 11/14/2019   Plan of Care:  1. Patient was evaluated.  2.  Sutures removed today. 3.  Recommend daily foot lotion 4.  Patient may transition out of the postsurgical shoe into good supportive diabetic shoes 5.  Return to clinic in 3 weeks for final follow-up visit and x-ray   Edrick Kins, DPM [facility]  Dr. Edrick Kins, [address]  [place], [place] [address]                Office [phone]  Fax [phone]

## 2019-12-11 ENCOUNTER — Ambulatory Visit: Payer: Self-pay | Admitting: *Deleted

## 2019-12-22 ENCOUNTER — Other Ambulatory Visit: Payer: Self-pay | Admitting: *Deleted

## 2019-12-22 NOTE — Patient Outreach (Signed)
[facility]) Care Management  12/22/2019  Shannon [name] 1923-11-02 [id number]   Call placed to member to follow up on recovery from foot surgery.  Report she has done well since having her staples removed during last visit, denies any open areas or drainage.  She keeps it clean, moisturized, and covered.  Next follow up scheduled for 10/6, daughter will provide transportation.  CBG today was 182, state it has been better but is usually a little higher in the mornings due to having her nighttime tube feedings.  She will continue to monitor, understands importance of glucose control in relation to wound healing.  Denies any urgent concerns, encouraged to contact this care manager with questions.  Will follow up within the next month.  Goals    .  More Housework      Pt would like to do more housework and stay active.    .  Recover from foot surgery (pt-stated)      CARE PLAN ENTRY (see longitudinal plan of care for additional care plan information)  Current Barriers:  [name] Knowledge Deficits related to Foot surgery, transmetatarsal amputation  Nurse Case Manager Clinical Goal(s):  [name] Over the next 28 days, patient will verbalize understanding of plan for decreased infection and increased recovery . Over the next 31 days, patient will not experience hospital admission. Hospital Admissions in last 6 months = 1 . Over the next 28 days, patient will attend all scheduled medical appointments: surgeon/foot center  Interventions:  . Inter-disciplinary care team collaboration (see longitudinal plan of care) . Discussed plans with patient for ongoing care management follow up and provided patient with direct contact information for care management team  Patient Self Care Activities:  . Performs ADL's independently . Unable to perform ADLs independently  Initial goal documentation       [name], RN, MSN [place] [phone]

## 2019-12-30 ENCOUNTER — Ambulatory Visit (INDEPENDENT_AMBULATORY_CARE_PROVIDER_SITE_OTHER): Payer: Medicare Other

## 2019-12-30 ENCOUNTER — Ambulatory Visit (INDEPENDENT_AMBULATORY_CARE_PROVIDER_SITE_OTHER): Payer: Medicare Other | Admitting: Pharmacist

## 2019-12-30 ENCOUNTER — Other Ambulatory Visit: Payer: Self-pay

## 2019-12-30 DIAGNOSIS — Z23 Encounter for immunization: Secondary | ICD-10-CM | POA: Diagnosis not present

## 2019-12-30 DIAGNOSIS — Z5181 Encounter for therapeutic drug level monitoring: Secondary | ICD-10-CM | POA: Diagnosis not present

## 2019-12-30 DIAGNOSIS — I4891 Unspecified atrial fibrillation: Secondary | ICD-10-CM

## 2019-12-30 DIAGNOSIS — I4892 Unspecified atrial flutter: Secondary | ICD-10-CM | POA: Diagnosis not present

## 2019-12-30 LAB — POCT INR: INR: 2.6 (ref 2.0–3.0)

## 2019-12-30 NOTE — Patient Instructions (Signed)
Start taking warfarin 2 tablets daily except 3 tablets on Sundays Tuesdays and Thursdays. Recheck in 4 weeks. Call Coumadin Clinic with any new medications [phone].

## 2020-01-03 IMAGING — XA IR REPLACE G/J TUBE W/ FLUORO
1 series · 3 of 3 positions shown · non-contrast
Comparison: none

INDICATION: Long-term indwelling gastrojejunostomy catheter. Leaking from the
skin entry site after tube feeds.

[Series 300: tube placements · 3 of 3 slices shown]
[im 1/3]
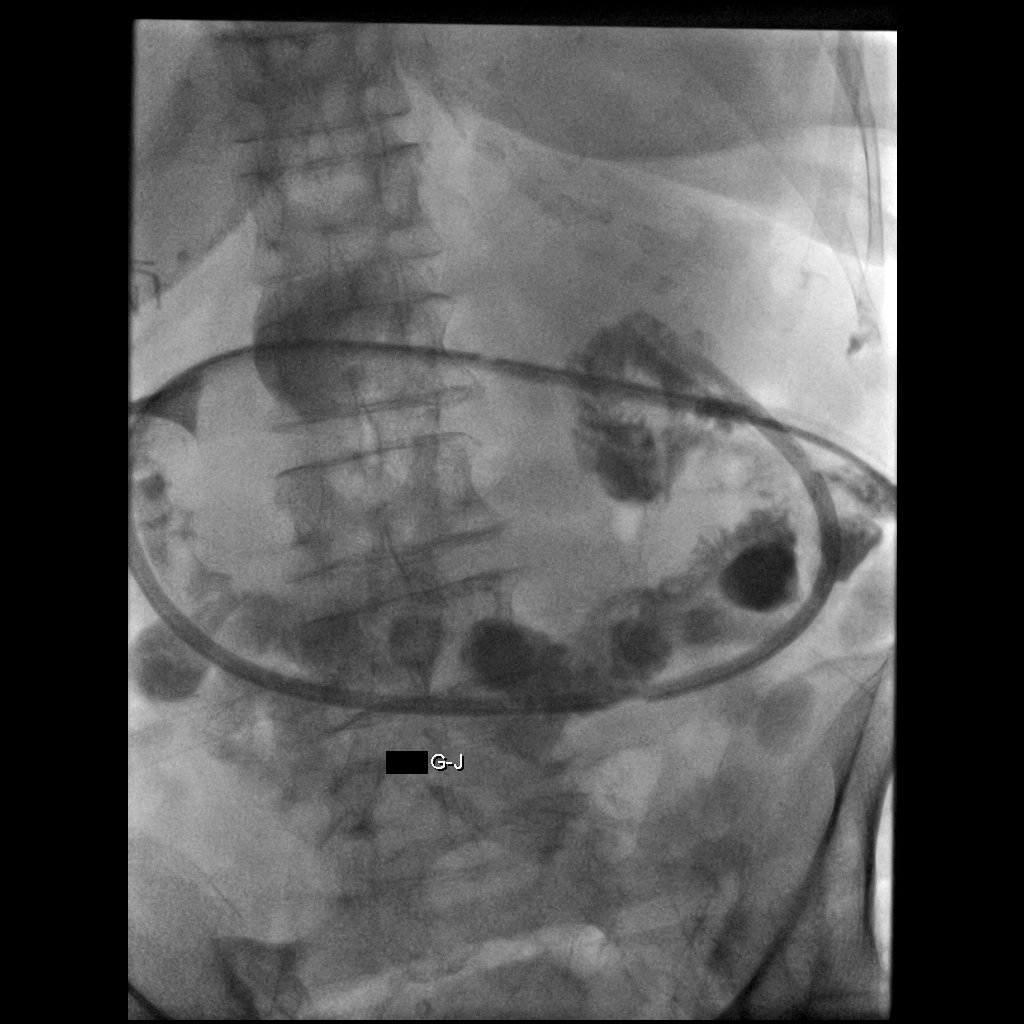
[im 2/3]
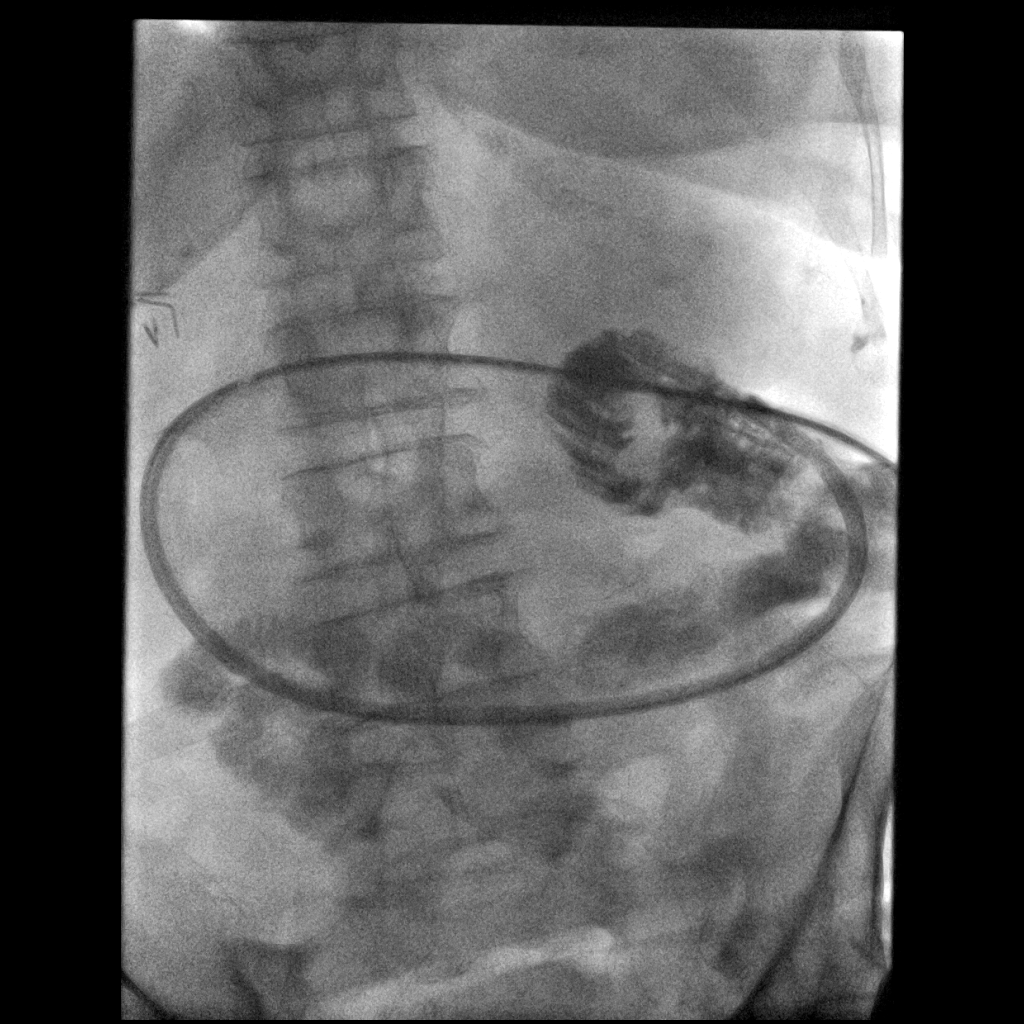
[im 3/3]
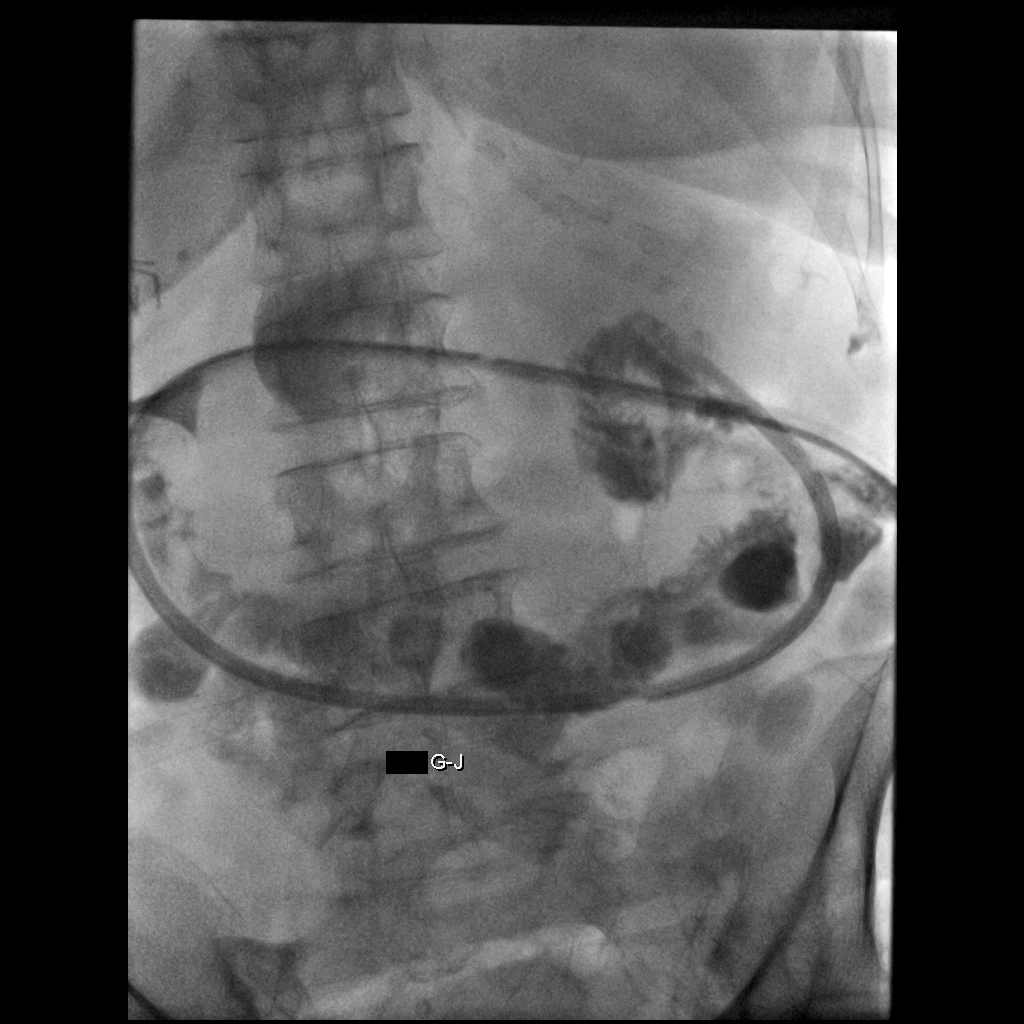

[3 of 3 positions shown; findings below may reference images not displayed]

EXAM:
DUAL-LUMEN GASTROJEJUNAL CATHETER REPLACEMENT UNDER FLUOROSCOPY

MEDICATIONS:
None indicated

ANESTHESIA/SEDATION:
None required

CONTRAST:  35 mL YYSNL8-6GG IOPAMIDOL (YYSNL8-6GG) INJECTION 61% -
administered into the bowel lumen.

PROCEDURE:
Informed written consent was obtained from the patient and family
after a thorough discussion of the procedural risks, benefits and
alternatives. All questions were addressed. Maximal Sterile Barrier
Technique was utilized including caps, mask, sterile gowns, sterile
gloves, sterile drape, hand hygiene and skin antiseptic. A timeout
was performed prior to the initiation of the procedure.

Fluoroscopic inspection demonstrated that the previous catheter had
pulled back into the proximal duodenum. Once the occlusive debris in
the jejunal limb was cleared, injection demonstrated a hole in the
proximal distal aspect of the jejunal limb, positioned in the
proximal duodenum, such that injected contrast refluxed back into
the lumen of the stomach, which may account for the significant
amount of tube feeds found within the stomach and resultant leakage
at the catheter entry site. The catheter was removed over a
guidewire. With an angled Kumpe catheter, 9 French peel-away sheath,
and Amplatz wire, access to the proximal jejunum was achieved. A new
dual-lumen gastrojejunostomy catheter was advanced over the Amplatz
wire , positioned with the tip in the proximal jejunum. The
retention balloon was inflated. Injection of both lumens
demonstrates wide patency and adequate positioning. The external
bumper was applied and the catheter was flushed. The patient
tolerated the procedure well.

FLUOROSCOPY TIME:  4 minutes 24 seconds; 12 mGy

COMPLICATIONS:
None immediate.
IMPRESSION: 1. Technically successful exchange and repositioning of dual-lumen
gastrojejunostomy catheter. Okay for routine use.

## 2020-01-07 ENCOUNTER — Ambulatory Visit (INDEPENDENT_AMBULATORY_CARE_PROVIDER_SITE_OTHER): Payer: Medicare Other | Admitting: Podiatry

## 2020-01-07 ENCOUNTER — Ambulatory Visit (INDEPENDENT_AMBULATORY_CARE_PROVIDER_SITE_OTHER): Payer: Medicare Other

## 2020-01-07 ENCOUNTER — Other Ambulatory Visit: Payer: Self-pay

## 2020-01-07 DIAGNOSIS — Z9889 Other specified postprocedural states: Secondary | ICD-10-CM | POA: Diagnosis not present

## 2020-01-07 DIAGNOSIS — L97512 Non-pressure chronic ulcer of other part of right foot with fat layer exposed: Secondary | ICD-10-CM

## 2020-01-07 DIAGNOSIS — E0859 Diabetes mellitus due to underlying condition with other circulatory complications: Secondary | ICD-10-CM

## 2020-01-07 DIAGNOSIS — L03116 Cellulitis of left lower limb: Secondary | ICD-10-CM

## 2020-01-07 MED ORDER — DOXYCYCLINE HYCLATE 100 MG PO TABS: 100.0000 mg | ORAL_TABLET | Freq: Two times a day (BID) | ORAL | 0 refills | Status: DC

## 2020-01-07 NOTE — Progress Notes (Signed)
Subjective:  Patient presents today status post transmetatarsal amputation of the right foot. DOS: 11/14/2019.  Patient states that she is doing very well.  She has been weightbearing in the postsurgical shoe as directed.  She also uses a walker for assistance.  She denies pain.   Patient does have a new complaint today regarding some pain and tenderness to the left leg.  She states that over the last few days has become increasingly red and swollen and painful.  She denies a history of injury or tripping and falling.  She presents for further treatment evaluation  Past Medical History:  Diagnosis Date  . Acute respiratory failure ([place])   . Anemia    previous blood transfusions  . Arthritis    "all over"  . Asthma   . Atrial flutter ([place])   . Bradycardia    requiring previous d/c of BB and reduction of amiodarone  . CAD (coronary artery disease)    nonobstructive per notes  . Chronic diastolic CHF (congestive heart failure) ([place])   . CKD (chronic kidney disease), stage III   . Complication of blood transfusion    "got the wrong blood type at [facility] in ~ 2015; no adverse reaction that we are aware of"/daughter, [name] (01/27/2016)  . COPD (chronic obstructive pulmonary disease) ([place])   . Depression    "light case"  . DVT (deep venous thrombosis) ([place]) 01/2016   a. LLE DVT 01/2016 - switched from Eliquis to Coumadin.  [name] Dyspnea    with some activity  . Gastric stenosis    a. s/p stomach tube  . GERD (gastroesophageal reflux disease)   . Headache   . History of 2019 novel coronavirus disease (COVID-19)   . History of blood transfusion    "several" (01/27/2016)  . History of stomach ulcers   . Hyperlipidemia   . Hypertension   . Hypothyroidism   . On home oxygen therapy    "2L; 9PM til 9AM" (06/27/2017)  . PAD (peripheral artery disease) ([place])    a. prior gangrene, toe amputations, intervention.  [name] PAF (paroxysmal atrial fibrillation) ([place])   . Paraesophageal hernia   .  Perforated gastric ulcer ([place])   . Pneumonia    "a few times" (06/27/2017)  . Seasonal allergies   . SIADH (syndrome of inappropriate ADH production) ([place])    [name] 01/10/2015  . Small bowel obstruction ([place])    "I don't know how many" (01/11/2015)  . Stroke [facility])    several- no residual  . Type II diabetes mellitus ([place])    "related to prednisone use  for > 20 yr; once predinose stopped; no more DM RX" (01/27/2016)  . UTI (urinary tract infection) 02/08/2016  . Ventral hernia with bowel obstruction     Objective: Physical Exam General: The patient is alert and oriented x3 in no acute distress.  Dermatology: Skin is cool, dry and supple bilateral lower extremities.  Amputation stump appears to be healed nicely.  There is a small superficial ulcer to the lateral aspect of the amputation stump.  It measures approximately 1.0 x 1.0 x 0.2 cm.  To the noted ulceration there is no eschar.  There is a minimal amount of slough fibrin and necrotic tissue noted.  Wound base and granulation tissue is red.  No malodor noted.  Minimal amount of serosanguineous drainage noted.  Periwound integrity is intact  Vascular: Palpable pedal pulses bilaterally.  Today there is some increased erythema and edema noted to the left  leg.  Neurological: Epicritic and protective threshold grossly intact bilaterally.   Musculoskeletal Exam: Status post TMA right foot.    Assessment: 1. s/p transmetatarsal amputation right foot. DOS: 11/14/2019 2.  Ulcer right lateral forefoot amputation stump 3.  Cellulitis left leg  Plan of Care:  1. Patient was evaluated.  2.  Continue antibiotic ointment and a light dressing daily to the ulcer of the lateral forefoot stump 3.  Prescription for doxycycline 100 mg 2 times daily #20 for left leg cellulitis 4.  Continue postsurgical shoe  5.  Return to clinic in 2 weeks  Edrick Kins, DPM [facility]  Dr. Edrick Kins, [address]                                         [place], [place] [address]                Office [phone]  Fax [phone]

## 2020-01-08 ENCOUNTER — Other Ambulatory Visit (HOSPITAL_COMMUNITY): Payer: Self-pay | Admitting: Radiology

## 2020-01-08 DIAGNOSIS — R633 Feeding difficulties, unspecified: Secondary | ICD-10-CM

## 2020-01-09 ENCOUNTER — Ambulatory Visit (HOSPITAL_COMMUNITY)
Admission: RE | Admit: 2020-01-09 | Discharge: 2020-01-09 | Disposition: A | Discharge status: Home / Self Care | Payer: Medicare Other | Source: Ambulatory Visit | Attending: Radiology | Admitting: Radiology

## 2020-01-09 ENCOUNTER — Other Ambulatory Visit: Payer: Self-pay

## 2020-01-09 DIAGNOSIS — K9413 Enterostomy malfunction: Secondary | ICD-10-CM | POA: Diagnosis not present

## 2020-01-09 DIAGNOSIS — R633 Feeding difficulties, unspecified: Secondary | ICD-10-CM | POA: Diagnosis not present

## 2020-01-09 DIAGNOSIS — Z434 Encounter for attention to other artificial openings of digestive tract: Secondary | ICD-10-CM | POA: Diagnosis not present

## 2020-01-09 HISTORY — PX: IR GJ TUBE CHANGE: IMG1440

## 2020-01-09 MED ORDER — LIDOCAINE VISCOUS HCL 2 % MT SOLN
OROMUCOSAL | Status: AC
  Filled 2020-01-09: qty 15

## 2020-01-09 MED ORDER — LIDOCAINE VISCOUS HCL 2 % MT SOLN
OROMUCOSAL | Status: AC | PRN
  Administered 2020-01-09: 5 mL via OROMUCOSAL

## 2020-01-09 MED ORDER — IOHEXOL 300 MG/ML  SOLN
50.0000 mL | Freq: Once | INTRAMUSCULAR | Status: AC | PRN
  Administered 2020-01-09: 25 mL

## 2020-01-09 NOTE — Procedures (Signed)
Interventional Radiology Procedure Note  Procedure: Fluoroscopic guided exchange of gastrojejunostomy tube  Findings: Please refer to procedural dictation for full description.  Complications: None immediate  Estimated Blood Loss: None  Recommendations: Continue to use G/J tube as per usual. Follow up as needed.   Ruthann Cancer, MD Pager: [phone]

## 2020-01-20 ENCOUNTER — Other Ambulatory Visit: Payer: Self-pay | Admitting: *Deleted

## 2020-01-20 NOTE — Patient Outreach (Signed)
[name]) Care Management  01/20/2020  Shannon [name] November 02, 1923 [id number]   Call placed to member to follow up on recovery from foot surgery.  State she is healing but slowly.  There is an open area still on her right foot and she now has cellulitis in her left leg.  She was seen by podiatrist on 10/6 and was given antibiotics, has completed course.  Denies any drainage or odor.  She will follow up on 10/27 for reevaluation.  Will also see PCP tomorrow for routine visit.  State she has continued to manage her blood sugars and blood pressure, feel blood sugars are still elevated in the mornings due to tube feedings (147 today) but A1C remains controlled.  Denies any urgent concerns, encouraged to contact this care manager with questions.  Agrees to follow up within the next month.  Goals Addressed              This Visit's Progress   .  COMPLETED: Recover from foot surgery (pt-stated)        CARE PLAN ENTRY (see longitudinal plan of care for additional care plan information)  Current Barriers:  [name] Knowledge Deficits related to Foot surgery, transmetatarsal amputation  Nurse Case Manager Clinical Goal(s):  [name] Over the next 28 days, patient will verbalize understanding of plan for decreased infection and increased recovery . Over the next 31 days, patient will not experience hospital admission. Hospital Admissions in last 6 months = 1 . Over the next 28 days, patient will attend all scheduled medical appointments: surgeon/foot center  Interventions:  . Inter-disciplinary care team collaboration (see longitudinal plan of care) . Discussed plans with patient for ongoing care management follow up and provided patient with direct contact information for care management team  Patient Self Care Activities:  . Performs ADL's independently . Unable to perform ADLs independently  Resolving due to duplicate goal     .  [facility] - Make and Keep All Appointments        Follow  Up Date 11/20   - ask family or friend for a ride - call to cancel if needed - keep a calendar with appointment dates    Why is this important?   Part of staying healthy is seeing the doctor for follow-up care.  If you forget your appointments, there are some things you can do to stay on track.    Notes:     .  [name] - Perform Foot Care        Follow Up Date 12/15   - check feet daily for cuts, sores or redness - wash and dry feet carefully every day - wear comfortable, cotton socks - wear comfortable, well-fitting shoes    Why is this important?   Good foot care is very important when you have diabetes.  There are many things you can do to keep your feet healthy and catch a problem early.    Notes:       [name], RN, MSN [place] [phone]

## 2020-01-21 ENCOUNTER — Ambulatory Visit (INDEPENDENT_AMBULATORY_CARE_PROVIDER_SITE_OTHER): Payer: Medicare Other | Admitting: Nurse Practitioner

## 2020-01-21 ENCOUNTER — Other Ambulatory Visit: Payer: Self-pay

## 2020-01-21 ENCOUNTER — Encounter: Payer: Self-pay | Admitting: Nurse Practitioner

## 2020-01-21 VITALS — BP 134/68 | HR 65 | Temp 97.1°F | Ht 66.0 in | Wt 136.0 lb

## 2020-01-21 DIAGNOSIS — E1142 Type 2 diabetes mellitus with diabetic polyneuropathy: Secondary | ICD-10-CM

## 2020-01-21 DIAGNOSIS — I5032 Chronic diastolic (congestive) heart failure: Secondary | ICD-10-CM | POA: Diagnosis not present

## 2020-01-21 DIAGNOSIS — D638 Anemia in other chronic diseases classified elsewhere: Secondary | ICD-10-CM

## 2020-01-21 DIAGNOSIS — I4819 Other persistent atrial fibrillation: Secondary | ICD-10-CM | POA: Diagnosis not present

## 2020-01-21 DIAGNOSIS — Z89431 Acquired absence of right foot: Secondary | ICD-10-CM

## 2020-01-21 DIAGNOSIS — E1122 Type 2 diabetes mellitus with diabetic chronic kidney disease: Secondary | ICD-10-CM | POA: Diagnosis not present

## 2020-01-21 DIAGNOSIS — F331 Major depressive disorder, recurrent, moderate: Secondary | ICD-10-CM

## 2020-01-21 DIAGNOSIS — Z931 Gastrostomy status: Secondary | ICD-10-CM

## 2020-01-21 DIAGNOSIS — J449 Chronic obstructive pulmonary disease, unspecified: Secondary | ICD-10-CM

## 2020-01-21 DIAGNOSIS — I10 Essential (primary) hypertension: Secondary | ICD-10-CM | POA: Diagnosis not present

## 2020-01-21 DIAGNOSIS — Z794 Long term (current) use of insulin: Secondary | ICD-10-CM | POA: Diagnosis not present

## 2020-01-21 DIAGNOSIS — I70235 Atherosclerosis of native arteries of right leg with ulceration of other part of foot: Secondary | ICD-10-CM

## 2020-01-21 DIAGNOSIS — N1832 Chronic kidney disease, stage 3b: Secondary | ICD-10-CM

## 2020-01-21 DIAGNOSIS — J4489 Other specified chronic obstructive pulmonary disease: Secondary | ICD-10-CM

## 2020-01-21 MED ORDER — DULOXETINE HCL 30 MG PO CPEP: 30.0000 mg | ORAL_CAPSULE | Freq: Every day | ORAL | 0 refills | Status: DC

## 2020-01-21 NOTE — Patient Instructions (Addendum)
Stop zoloft Start cymbalta 30 mg daily-- if feeling like nerve pain has not improved after 2 weeks to notify office.   4 weeks for follow up on mood/neuropathy- video or telephone visit.   Elevate legs when sitting "toes above nose" for at least 45 mins a day, ideally elevate legs when you are sitting.

## 2020-01-21 NOTE — Progress Notes (Signed)
Careteam: Patient Care Team: [name], NP as PCP - General (Geriatric Medicine) Dorothy Spark, MD as PCP - Cardiology (Cardiology) Edrick Kins, DPM as Consulting Physician (Podiatry) Dorothy Spark, MD as Consulting Physician (Cardiology) [name] [name], NP as Nurse Practitioner (Geriatric Medicine) Camillo Flaming, [place] as Referring Physician (Optometry) [name], RN as [place] Management  PLACE OF SERVICE:  [place]  Advanced Directive information    Allergies  Allergen Reactions  . Penicillins Itching, Rash and Other (See Comments)    Tolerated amoxicillin, unasyn, zosyn & cephalosporins in the past. Did it involve swelling of the face/tongue/throat, SOB, or low BP? No Did it involve sudden or severe rash/hives, skin peeling, or any reaction on the inside of your mouth or nose? No Did you need to seek medical attention at a hospital or doctor's office? Unknown When did it last happen?5+ years If all above answers are "NO", may proceed with cephalosporin use.     Chief Complaint  Patient presents with  . Medical Management of Chronic Issues    3 month follow-up. Discuss need for eye exam. Discuss feeding tube concerns, drainage bag and tubing not working.      HPI: Patient is a 86 y.o. female for routine follow up.  A flutter- coumadin managed by coumadin clinic. No palpitations or chest pains.   DM- well controlled. No low blood sugars  PEG tube- using a connector to connect PEG tub to drainage bag to removed excessive gastric secretions.   Reports left leg has been sore and red, she was treated with antibiotics for cellulitis and states its improved. Following with podiatrist routinely.  Reports her neuropathy has been waking her up at night.   OA- uses salon pas, ice, heat, only uses oxycodone when nothing has helped for days.   Hyperlipidemia- continues on lipitor 10 mg daily   htn- controlled on  norvasc   COPD- stable on dulera twice daily  Depression- stable at this time   Review of Systems:  Review of Systems  Constitutional: Negative for chills, fever and weight loss.  HENT: Negative for tinnitus.   Respiratory: Negative for cough, sputum production and shortness of breath.   Cardiovascular: Negative for chest pain, palpitations and leg swelling.  Gastrointestinal: Negative for abdominal pain, constipation, diarrhea and heartburn.  Genitourinary: Negative for dysuria, frequency and urgency.  Musculoskeletal: Positive for joint pain. Negative for back pain, falls and myalgias.  Skin: Negative.   Neurological: Positive for tingling and weakness. Negative for dizziness and headaches.  Psychiatric/Behavioral: Positive for depression. Negative for memory loss. The patient does not have insomnia.     Past Medical History:  Diagnosis Date  . Acute respiratory failure ([place])   . Anemia    previous blood transfusions  . Arthritis    "all over"  . Asthma   . Atrial flutter ([place])   . Bradycardia    requiring previous d/c of BB and reduction of amiodarone  . CAD (coronary artery disease)    nonobstructive per notes  . Chronic diastolic CHF (congestive heart failure) ([place])   . CKD (chronic kidney disease), stage III ([place])   . Complication of blood transfusion    "got the wrong blood type at [facility] in ~ 2015; no adverse reaction that we are aware of"/daughter, [name] (01/27/2016)  . COPD (chronic obstructive pulmonary disease) ([place])   . Depression    "light case"  . DVT (deep venous thrombosis) ([place]) 01/2016  a. LLE DVT 01/2016 - switched from Eliquis to Coumadin.  [name] Dyspnea    with some activity  . Gastric stenosis    a. s/p stomach tube  . GERD (gastroesophageal reflux disease)   . Headache   . History of 2019 novel coronavirus disease (COVID-19)   . History of blood transfusion    "several" (01/27/2016)  . History of stomach ulcers   . Hyperlipidemia   .  Hypertension   . Hypothyroidism   . On home oxygen therapy    "2L; 9PM til 9AM" (06/27/2017)  . PAD (peripheral artery disease) ([place])    a. prior gangrene, toe amputations, intervention.  [name] PAF (paroxysmal atrial fibrillation) ([place])   . Paraesophageal hernia   . Perforated gastric ulcer ([place])   . Pneumonia    "a few times" (06/27/2017)  . Seasonal allergies   . SIADH (syndrome of inappropriate ADH production) ([place])    [name] 01/10/2015  . Small bowel obstruction ([place])    "I don't know how many" (01/11/2015)  . Stroke [facility])    several- no residual  . Type II diabetes mellitus ([place])    "related to prednisone use  for > 20 yr; once predinose stopped; no more DM RX" (01/27/2016)  . UTI (urinary tract infection) 02/08/2016  . Ventral hernia with bowel obstruction    Past Surgical History:  Procedure Laterality Date  . ABDOMINAL AORTOGRAM N/A 09/21/2016   Procedure: Abdominal Aortogram;  Surgeon: Waynetta Sandy, MD;  Location: [facility];  Service: Cardiovascular;  Laterality: N/A;  . AMPUTATION Left 09/25/2016   Procedure: AMPUTATION DIGIT- LEFT 2ND AND 3RD TOES;  Surgeon: Rosetta Posner, MD;  Location: [facility];  Service: Vascular;  Laterality: Left;  . AMPUTATION Right 05/03/2018   Procedure: AMPUTATION RIGHT GREAT TOE;  Surgeon: Rosetta Posner, MD;  Location: [facility];  Service: Vascular;  Laterality: Right;  . CATARACT EXTRACTION W/ INTRAOCULAR LENS  IMPLANT, BILATERAL    . CHOLECYSTECTOMY OPEN    . COLECTOMY    . ESOPHAGOGASTRODUODENOSCOPY N/A 01/19/2014   Procedure: ESOPHAGOGASTRODUODENOSCOPY (EGD);  Surgeon: Irene Shipper, MD;  [facility];  Service: Endoscopy;  Laterality: N/A;  . ESOPHAGOGASTRODUODENOSCOPY N/A 01/20/2014   Procedure: ESOPHAGOGASTRODUODENOSCOPY (EGD);  Surgeon: Irene Shipper, MD;  Location: [facility];  Service: Endoscopy;  Laterality: N/A;  . ESOPHAGOGASTRODUODENOSCOPY N/A 03/19/2014   Procedure: ESOPHAGOGASTRODUODENOSCOPY (EGD);  Surgeon:  Milus Banister, MD;  Location: [facility];  Service: Endoscopy;  Laterality: N/A;  . ESOPHAGOGASTRODUODENOSCOPY N/A 07/08/2015   Procedure: ESOPHAGOGASTRODUODENOSCOPY (EGD);  Surgeon: Doran Stabler, MD;  Location: [facility];  Service: Endoscopy;  Laterality: N/A;  . ESOPHAGOGASTRODUODENOSCOPY (EGD) WITH PROPOFOL N/A 09/15/2015   Procedure: ESOPHAGOGASTRODUODENOSCOPY (EGD) WITH PROPOFOL;  Surgeon: Manus Gunning, MD;  [facility];  Service: Gastroenterology;  Laterality: N/A;  . GASTROJEJUNOSTOMY     hx/notes 01/10/2015  . GASTROJEJUNOSTOMY N/A 09/23/2015   Procedure: OPEN GASTROJEJUNOSTOMY TUBE PLACEMENT;  Surgeon: Arta Bruce Kinsinger, MD;  [facility]: [facility];  Service: General;  Laterality: N/A;  . GLAUCOMA SURGERY Bilateral   . HERNIA REPAIR  2015  . IR CM INJ ANY COLONIC TUBE W/FLUORO  01/05/2017  . IR CM INJ ANY COLONIC TUBE W/FLUORO  06/07/2017  . IR CM INJ ANY COLONIC TUBE W/FLUORO  11/05/2017  . IR CM INJ ANY COLONIC TUBE W/FLUORO  09/18/2018  . IR CM INJ ANY COLONIC TUBE W/FLUORO  03/06/2019  . IR CM INJ ANY COLONIC TUBE W/FLUORO  03/11/2019  .  IR GENERIC HISTORICAL  01/07/2016   IR GJ TUBE CHANGE 01/07/2016 Jacqulynn Cadet, MD [facility]  . IR GENERIC HISTORICAL  01/27/2016   IR MECH REMOV OBSTRUC MAT ANY COLON TUBE W/FLUORO 01/27/2016 Markus Daft, MD [facility]  . IR GENERIC HISTORICAL  02/07/2016   IR PATIENT EVAL TECH 0-60 MINS Darrell K Allred, PA-C [facility]  . IR GENERIC HISTORICAL  02/08/2016   IR GJ TUBE CHANGE 02/08/2016 Greggory Keen, MD [facility]  . IR GENERIC HISTORICAL  01/06/2016   IR GJ TUBE CHANGE 01/06/2016 [facility] OUT REF  . IR GENERIC HISTORICAL  05/02/2016   IR CM INJ ANY COLONIC TUBE W/FLUORO 05/02/2016 Arne Cleveland, MD [facility]  . IR GENERIC HISTORICAL  05/15/2016   IR GJ TUBE CHANGE 05/15/2016 Sandi Mariscal, MD [facility]  . IR GENERIC HISTORICAL  06/28/2016   IR GJ TUBE CHANGE 06/28/2016 [facility]  . IR GJ TUBE CHANGE   02/20/2017  . IR GJ TUBE CHANGE  05/10/2017  . IR GJ TUBE CHANGE  07/06/2017  . IR GJ TUBE CHANGE  08/02/2017  . IR GJ TUBE CHANGE  10/15/2017  . IR GJ TUBE CHANGE  12/26/2017  . IR [place] TUBE CHANGE  04/08/2018  . IR [place] TUBE CHANGE  06/19/2018  . IR [place] TUBE CHANGE  03/03/2019  . IR [place] TUBE CHANGE  06/06/2019  . IR GJ TUBE CHANGE  09/26/2019  . IR GJ TUBE CHANGE  01/09/2020  . IR PATIENT EVAL TECH 0-60 MINS  10/19/2016  . IR PATIENT EVAL TECH 0-60 MINS  12/25/2016  . IR PATIENT EVAL TECH 0-60 MINS  01/29/2017  . IR PATIENT EVAL TECH 0-60 MINS  04/04/2017  . IR PATIENT EVAL TECH 0-60 MINS  04/30/2017  . IR PATIENT EVAL TECH 0-60 MINS  07/31/2017  . IR REPLC DUODEN/JEJUNO TUBE PERCUT W/FLUORO  11/14/2016  . LAPAROTOMY N/A 01/20/2015   Procedure: EXPLORATORY LAPAROTOMY;  Surgeon: Coralie Keens, MD;  Location: [place] [facility];  Service: General;  Laterality: N/A;  . LOWER EXTREMITY ANGIOGRAPHY Left 09/21/2016   Procedure: Lower Extremity Angiography;  Surgeon: Waynetta Sandy, MD;  Location: [facility];  Service: Cardiovascular;  Laterality: Left;  . LOWER EXTREMITY ANGIOGRAPHY Right 06/27/2017   Procedure: Lower Extremity Angiography;  Surgeon: Waynetta Sandy, MD;  Location: [facility];  Service: Cardiovascular;  Laterality: Right;  . LYSIS OF ADHESION N/A 01/20/2015   Procedure: LYSIS OF ADHESIONS < 1 HOUR;  Surgeon: Coralie Keens, MD;  Location: [facility];  Service: General;  Laterality: N/A;  . PERIPHERAL VASCULAR BALLOON ANGIOPLASTY Left 09/21/2016   Procedure: Peripheral Vascular Balloon Angioplasty;  Surgeon: Waynetta Sandy, MD;  Location: [facility];  Service: Cardiovascular;  Laterality: Left;  drug coated balloon  . PERIPHERAL VASCULAR BALLOON ANGIOPLASTY Right 06/27/2017   Procedure: PERIPHERAL VASCULAR BALLOON ANGIOPLASTY;  Surgeon: Waynetta Sandy, MD;  Location: [facility];  Service: Cardiovascular;  Laterality: Right;  SFA/TPTRUNK  .  TONSILLECTOMY    . TRANSMETATARSAL AMPUTATION Right 11/14/2019   Procedure: TRANSMETATARSAL AMPUTATION RIGHT FOOT;  Surgeon: Edrick Kins, DPM;  Location: [facility];  Service: Podiatry;  Laterality: Right;  . TUBAL LIGATION    . VENTRAL HERNIA REPAIR  2015   incarcerated ventral hernia ([facility] 09/2013)/notes 01/10/2015   Social History:   reports that she has never smoked. She quit smokeless tobacco use about 39 years ago.  Her smokeless tobacco use included snuff. She reports that she  does not drink alcohol and does not use drugs.  Family History  Problem Relation Age of Onset  . Stroke Mother   . Hypertension Mother   . Diabetes Brother   . Glaucoma Son   . Glaucoma Son   . Heart attack Neg Hx   . Colon cancer Neg Hx   . Stomach cancer Neg Hx     Medications: Patient's Medications  New Prescriptions   No medications on file  Previous Medications   ACETAMINOPHEN (TYLENOL) 500 MG TABLET    Take 500-1,000 mg by mouth every 6 (six) hours as needed (pain).    AMINO ACIDS-PROTEIN HYDROLYS (FEEDING SUPPLEMENT, PRO-STAT SUGAR FREE 64,) LIQD    Take 30 mLs by mouth 2 (two) times daily.   AMLODIPINE (NORVASC) 5 MG TABLET    Take 1 tablet (5 mg total) by mouth daily.   ATORVASTATIN (LIPITOR) 10 MG TABLET    TAKE 1 TABLET BY MOUTH EVERY DAY   B COMPLEX VITAMINS TABLET    Take 1 tablet by mouth daily.    BENZOCAINE (AMERICAINE) 20 % RECTAL OINTMENT    Place rectally every 3 (three) hours as needed for pain.   CALCIUM-VITAMIN D (OSCAL WITH D) 500-200 MG-UNIT PER TABLET    Take 1 tablet by mouth daily with breakfast.    CARBOXYMETHYLCELLULOSE SODIUM (ARTIFICIAL TEARS OP)    Place 1 drop into both eyes 4 (four) times daily.   CYCLOSPORINE (RESTASIS) 0.05 % OPHTHALMIC EMULSION    USE 1 DROP INTO BOTH EYES TWICE DAILY   DULERA 200-5 MCG/ACT AERO    TAKE TWO PUFFS BY MOUTH TWICE DAILY   FERROUS SULFATE 220 (44 FE) MG/5ML SOLUTION    TAKE 7.4 MLS (325 MG TOTAL) BY MOUTH DAILY.   FLUTICASONE (FLONASE) 50  MCG/ACT NASAL SPRAY    SPRAY 2 SPRAYS INTO EACH NOSTRIL EVERY DAY   FUROSEMIDE (LASIX) 20 MG TABLET    Take 1 tablet (20 mg total) by mouth daily.   FUROSEMIDE (LASIX) 40 MG TABLET    Take 20-40 mg by mouth See admin instructions. Take 20 mg daily, may instead take 40 mg daily as needed for edema   GABAPENTIN (NEURONTIN) 250 MG/5ML SOLUTION    TAKE 6 MLS (300 MG TOTAL) BY MOUTH 2 (TWO) TIMES DAILY.   GENTAMICIN CREAM (GARAMYCIN) 0.1 %    Apply 1 application topically daily as needed (dry skin).   HYDRALAZINE (APRESOLINE) 50 MG TABLET    TAKE 1 TABLET BY MOUTH THREE TIMES A DAY   INSULIN ASPART (NOVOLOG) 100 UNIT/ML FLEXPEN    Inject into the skin as needed for high blood sugar. New sliding Scale If BS is 151 - 251 = 2 units,  If BS is 251 -  350 = 4 units,  If BS is 351 - 450 = 6 units Call provider if BS runs more than 400   INSULIN GLARGINE (LANTUS SOLOSTAR) 100 UNIT/ML SOLOSTAR PEN    Inject 8 Units into the skin at bedtime.   INSULIN PEN NEEDLE (B-D UF III MINI PEN NEEDLES) 31G X 5 MM MISC    Use twice daily with giving insulin injections. Dx E11.9   IPRATROPIUM-ALBUTEROL (DUONEB) 0.5-2.5 (3) MG/3ML SOLN    Take 3 mLs by nebulization every 6 (six) hours as needed (shortness of breath).   ISOSORBIDE MONONITRATE (IMDUR) 30 MG 24 HR TABLET    TAKE 2 TABLETS BY MOUTH DAILY. PLEASE MAKE ANNUAL APPT WITH DR. Meda Coffee FOR FUTURE REFILLS   LATANOPROST (  XALATAN) 0.005 % OPHTHALMIC SOLUTION    Place 1 drop into both eyes at bedtime.   LEVALBUTEROL (XOPENEX HFA) 45 MCG/ACT INHALER    Inhale 2 puffs into the lungs every 6 (six) hours as needed for wheezing.   LEVOTHYROXINE (SYNTHROID) 125 MCG TABLET    TAKE 1 TABLET BY MOUTH EVERY DAY   LORATADINE (CLARITIN) 10 MG TABLET    Take 10 mg by mouth daily as needed for allergies.   MULTIPLE VITAMIN (MULTIVITAMIN WITH MINERALS) TABS TABLET    Take 1 tablet by mouth daily.   NUTRITIONAL SUPPLEMENTS (FEEDING SUPPLEMENT, OSMOLITE 1.5 CAL,) LIQD    Place 711 mLs into  feeding tube See admin instructions. Take 711 ml over a 12 hour period per day   NYSTATIN (MYCOSTATIN/NYSTOP) POWDER    Apply 1 application topically as needed (skin irritation).   OXYCODONE (ROXICODONE) 5 MG IMMEDIATE RELEASE TABLET    Take 1 tablet (5 mg total) by mouth every 4 (four) hours as needed for severe pain. May take 1/2 tab q4hrs as needed for mild pain.   PANTOPRAZOLE (PROTONIX) 40 MG TABLET    TAKE 1 TABLET BY MOUTH EVERY DAY   POLYETHYLENE GLYCOL (MIRALAX / GLYCOLAX) 17 G PACKET    Take 17 g by mouth as needed.   PROBIOTIC PRODUCT (EQL PROBIOTIC COLON SUPPORT PO)    Take 1 capsule by mouth daily.   SENNA-DOCUSATE (SENOKOT-S) 8.6-50 MG TABLET    Take 1 tablet by mouth as needed for mild constipation.   SERTRALINE (ZOLOFT) 50 MG TABLET    Take 50 mg by mouth daily. Pt currently taking 50 mg of liquid, family noticed changes when the original 100 mg dose was reduced to 50. Family requested pt go back to 100 mg. Pt will continue to take 50 mg qd of the liquid until she runs out then switch to the 100 mg tablets.   WARFARIN (COUMADIN) 2.5 MG TABLET    TAKE 2-3 TABLETS BY MOUTH DAILY AS DIRECTED BY COUMADIN CLINIC.   WATER FOR IRRIGATION, STERILE (FREE WATER) SOLN    Place 180 mLs into feeding tube 3 (three) times daily.    WHITE PETROLATUM-MINERAL OIL (SYSTANE NIGHTTIME) OINT    Place 1 application into both eyes at bedtime.   Modified Medications   No medications on file  Discontinued Medications   DOXYCYCLINE (VIBRA-TABS) 100 MG TABLET    Take 1 tablet (100 mg total) by mouth 2 (two) times daily.    Physical Exam:  Vitals:   01/21/20 1350  BP: 134/68  Pulse: 65  Temp: (!) 97.1 F (36.2 C)  TempSrc: Temporal  SpO2: 98%  Weight: 136 lb (61.7 kg)  Height: 5\' 6"  (1.676 m)   Body mass index is 21.95 kg/m. Wt Readings from Last 3 Encounters:  01/21/20 136 lb (61.7 kg)  12/09/19 133 lb 12.8 oz (60.7 kg)  11/14/19 129 lb (58.5 kg)    Physical Exam Constitutional:       General: She is not in acute distress.    Appearance: She is well-developed. She is not diaphoretic.     Comments: Frail elderly female  HENT:     Head: Normocephalic and atraumatic.     Mouth/Throat:     Pharynx: No oropharyngeal exudate.  Eyes:     Conjunctiva/sclera: Conjunctivae normal.     Pupils: Pupils are equal, round, and reactive to light.  Cardiovascular:     Rate and Rhythm: Normal rate and regular rhythm.  Heart sounds: Normal heart sounds.  Pulmonary:     Effort: Pulmonary effort is normal.     Breath sounds: Normal breath sounds.  Abdominal:     General: Bowel sounds are normal.     Palpations: Abdomen is soft.     Comments: PEG  Musculoskeletal:        General: No tenderness.     Cervical back: Normal range of motion and neck supple.  Skin:    General: Skin is warm and dry.  Neurological:     Mental Status: She is alert and oriented to person, place, and time.     Gait: Gait abnormal (slow, uses walker).     Labs reviewed: Basic Metabolic Panel: Recent Labs    05/18/19 1925 05/18/19 2355 07/07/19 0123 07/08/19 0539 08/27/19 1239 08/27/19 2016 08/28/19 0101 09/11/19 1424 09/24/19 2000 11/07/19 1454 11/15/19 0506  NA 134*   < >  --    < >   < >  --  137   < > 136 139 134*  K 4.3   < >  --    < >   < >  --  4.6   < > 4.6 4.4 4.3  CL 101   < >  --    < >   < >  --  103   < > 103 103 100  CO2 24   < >  --    < >   < >  --  23   < > 23 28 24   GLUCOSE 80   < >  --    < >   < >  --  93   < > 98 76 162*  BUN 60*   < >  --    < >   < >  --  44*   < > 49* 52* 37*  CREATININE 1.75*   < >  --    < >   < >  --  1.34*   < > 1.33* 1.41* 1.37*  CALCIUM 9.4   < >  --    < >   < >  --  9.7   < > 9.0 9.5 9.3  MG 2.2  --   --   --   --  2.3  --   --   --   --  2.1  PHOS  --   --   --   --   --   --   --   --   --   --  4.4  TSH  --   --  1.623  --   --   --  1.428  --   --   --   --    < > = values in this interval not displayed.   Liver Function  Tests: Recent Labs    08/15/19 0519 08/15/19 0519 09/24/19 2000 11/07/19 1454 11/15/19 0506  AST 25   < > 21 24 25   ALT 19   < > 17 16 18   ALKPHOS 70  --  75  --  78  BILITOT 0.1*   < > 0.7 0.4 0.7  PROT 7.0   < > 7.6 7.5 7.0  ALBUMIN 3.4*  --  3.4*  --  3.0*   < > = values in this interval not displayed.   Recent Labs    07/07/19 0122 08/15/19 0519 09/24/19 2000  LIPASE 26 30 24  No results for input(s): AMMONIA in the last 8760 hours. CBC: Recent Labs    09/24/19 2000 11/07/19 1454 11/15/19 0506  WBC 6.7 5.1 4.9  NEUTROABS 4.3 2,917 4.4  HGB 9.1* 9.7* 9.3*  HCT 26.8* 29.9* 27.7*  MCV 91.5 91.7 90.2  PLT 240 223 193   Lipid Panel: No results for input(s): CHOL, HDL, LDLCALC, TRIG, CHOLHDL, LDLDIRECT in the last 8760 hours. TSH: Recent Labs    07/07/19 0123 08/28/19 0101  TSH 1.623 1.428   A1C: Lab Results  Component Value Date   HGBA1C 6.3 (H) 10/23/2019     Assessment/Plan 1. Chronic diastolic CHF (congestive heart failure) (HCC) Stable on imdur with lasix, no worsening chest pains, LE edema, or shortness of breath   2. Type 2 diabetes mellitus with peripheral neuropathy (HCC) Blood sugars controlled. Reports ongoing neuropathy. Taking gabapentin BID but continues to wake up at night with pain.  - DULoxetine (CYMBALTA) 30 MG capsule; Take 1 capsule (30 mg total) by mouth daily.  Dispense: 30 capsule; Refill: 0  3. Persistent atrial fibrillation (HCC) Rate controlled, continues on coumadin for anticoagulation. No signs of bleeding. Will continue current regimen.   4. Anemia of chronic disease Stable on recent labs ,continues on supplement.   5. Type 2 diabetes mellitus with stage 3b chronic kidney disease, with long-term current use of insulin (HCC) -A1c at goal. Without hypoglycemia noted. Will continue current regimen -continues with routine foot care/monitoring and encouraged to keep up with diabetic eye exams through ophthalmology   6.  Moderate episode of recurrent major depressive disorder (HCC) Stable on zoloft but with increase in neuropathy will change to cymbalta at this time. - DULoxetine (CYMBALTA) 30 MG capsule; Take 1 capsule (30 mg total) by mouth daily.  Dispense: 30 capsule; Refill: 0  7. Essential hypertension, benign Controlled on hydralazine, imdur and lasix.  8. COPD with asthma ([place]) Stable on dulera BID  9. PEG  Will attempt to reach out to see If connector can be located, also for daughter to try medical supply store.   10. History of transmetatarsal amputation of right foot ([place]) Healing well, followed closely by podiatry   Next appt: 4 months Jyren Cerasoli K. [place], [facility] [phone]

## 2020-01-27 ENCOUNTER — Ambulatory Visit (INDEPENDENT_AMBULATORY_CARE_PROVIDER_SITE_OTHER): Payer: Medicare Other | Admitting: *Deleted

## 2020-01-27 ENCOUNTER — Other Ambulatory Visit: Payer: Self-pay

## 2020-01-27 DIAGNOSIS — I4891 Unspecified atrial fibrillation: Secondary | ICD-10-CM | POA: Diagnosis not present

## 2020-01-27 DIAGNOSIS — Z5181 Encounter for therapeutic drug level monitoring: Secondary | ICD-10-CM

## 2020-01-27 DIAGNOSIS — I4892 Unspecified atrial flutter: Secondary | ICD-10-CM

## 2020-01-27 LAB — POCT INR: INR: 4.5 — AB (ref 2.0–3.0)

## 2020-01-27 NOTE — Patient Instructions (Signed)
Description   Do not take any Warfarin today and No Warfarin tomorrow then continue taking warfarin 2 tablets daily except 3 tablets on Sundays, Tuesdays, and Thursdays. Recheck in 2 weeks. Call Coumadin Clinic with any new medications [phone].

## 2020-01-28 ENCOUNTER — Ambulatory Visit (INDEPENDENT_AMBULATORY_CARE_PROVIDER_SITE_OTHER): Payer: Medicare Other | Admitting: Podiatry

## 2020-01-28 DIAGNOSIS — Z9889 Other specified postprocedural states: Secondary | ICD-10-CM

## 2020-01-28 DIAGNOSIS — E0859 Diabetes mellitus due to underlying condition with other circulatory complications: Secondary | ICD-10-CM | POA: Diagnosis not present

## 2020-01-28 DIAGNOSIS — L97512 Non-pressure chronic ulcer of other part of right foot with fat layer exposed: Secondary | ICD-10-CM | POA: Diagnosis not present

## 2020-01-28 NOTE — Progress Notes (Signed)
Subjective:  Patient presents today status post transmetatarsal amputation of the right foot. DOS: 11/14/2019.  Patient continues to experience some pain and tenderness to the lateral aspect of the right forefoot.  She has been dressing the foot with antibiotic ointment and wearing the surgical shoe as directed.  No new complaints at this time  Past Medical History:  Diagnosis Date  . Acute respiratory failure ([place])   . Anemia    previous blood transfusions  . Arthritis    "all over"  . Asthma   . Atrial flutter ([place])   . Bradycardia    requiring previous d/c of BB and reduction of amiodarone  . CAD (coronary artery disease)    nonobstructive per notes  . Chronic diastolic CHF (congestive heart failure) ([place])   . CKD (chronic kidney disease), stage III ([place])   . Complication of blood transfusion    "got the wrong blood type at [facility] in ~ 2015; no adverse reaction that we are aware of"/daughter, [name] (01/27/2016)  . COPD (chronic obstructive pulmonary disease) ([place])   . Depression    "light case"  . DVT (deep venous thrombosis) ([place]) 01/2016   a. LLE DVT 01/2016 - switched from Eliquis to Coumadin.  [name] Dyspnea    with some activity  . Gastric stenosis    a. s/p stomach tube  . GERD (gastroesophageal reflux disease)   . Headache   . History of 2019 novel coronavirus disease (COVID-19)   . History of blood transfusion    "several" (01/27/2016)  . History of stomach ulcers   . Hyperlipidemia   . Hypertension   . Hypothyroidism   . On home oxygen therapy    "2L; 9PM til 9AM" (06/27/2017)  . PAD (peripheral artery disease) ([place])    a. prior gangrene, toe amputations, intervention.  [name] PAF (paroxysmal atrial fibrillation) ([place])   . Paraesophageal hernia   . Perforated gastric ulcer ([place])   . Pneumonia    "a few times" (06/27/2017)  . Seasonal allergies   . SIADH (syndrome of inappropriate ADH production) ([place])    [name] 01/10/2015  . Small bowel obstruction ([place])    "I  don't know how many" (01/11/2015)  . Stroke [facility])    several- no residual  . Type II diabetes mellitus ([place])    "related to prednisone use  for > 20 yr; once predinose stopped; no more DM RX" (01/27/2016)  . UTI (urinary tract infection) 02/08/2016  . Ventral hernia with bowel obstruction     Objective: Physical Exam General: The patient is alert and oriented x3 in no acute distress.  Dermatology: Skin is cool, dry and supple bilateral lower extremities.  Amputation stump appears to be healed nicely.  There is a small superficial ulcer to the lateral aspect of the amputation stump.  It measures approximately 1.0 x 1.0 x 0.2 cm.  To the noted ulceration there is no eschar.  There is a minimal amount of slough fibrin and necrotic tissue noted.  Wound base and granulation tissue is red.  No malodor noted.  Minimal amount of serosanguineous drainage noted.  Periwound integrity is intact  Vascular: Palpable pedal pulses bilaterally.  Negative for any significant erythema or edema to the leg.  Neurological: Epicritic and protective threshold grossly intact bilaterally.   Musculoskeletal Exam: Status post TMA right foot.    Assessment: 1. s/p transmetatarsal amputation right foot. DOS: 11/14/2019 2.  Ulcer right lateral forefoot amputation stump 3.  Cellulitis left leg-resolved Plan of  Care:  1. Patient was evaluated.  2.  Medically necessary excisional debridement including subcutaneous tissue was performed using a tissue nipper with post debridement measurement same as pre-.  Excisional debridement of all the necrotic nonviable tissue down to the healthy bleeding viable tissue was performed 3.  Prisma collagen dressing was provided.  Apply every other day 4.  Appointment with Pedorthist for custom molded orthotics 5.  Return to clinic in 3 weeks  Edrick Kins, DPM [facility]  Dr. Edrick Kins, DPM    [address], [place] [address]                Office [phone]  Fax [phone]

## 2020-02-06 DIAGNOSIS — H16223 Keratoconjunctivitis sicca, not specified as Sjogren's, bilateral: Secondary | ICD-10-CM | POA: Diagnosis not present

## 2020-02-06 DIAGNOSIS — H401132 Primary open-angle glaucoma, bilateral, moderate stage: Secondary | ICD-10-CM | POA: Diagnosis not present

## 2020-02-09 DIAGNOSIS — Z961 Presence of intraocular lens: Secondary | ICD-10-CM | POA: Diagnosis not present

## 2020-02-09 DIAGNOSIS — E119 Type 2 diabetes mellitus without complications: Secondary | ICD-10-CM | POA: Diagnosis not present

## 2020-02-09 DIAGNOSIS — H16143 Punctate keratitis, bilateral: Secondary | ICD-10-CM | POA: Diagnosis not present

## 2020-02-09 LAB — HM DIABETES EYE EXAM

## 2020-02-10 ENCOUNTER — Other Ambulatory Visit: Payer: Self-pay | Admitting: Nurse Practitioner

## 2020-02-10 ENCOUNTER — Ambulatory Visit (INDEPENDENT_AMBULATORY_CARE_PROVIDER_SITE_OTHER): Payer: Medicare Other | Admitting: *Deleted

## 2020-02-10 ENCOUNTER — Other Ambulatory Visit: Payer: Self-pay

## 2020-02-10 ENCOUNTER — Other Ambulatory Visit: Payer: Medicare Other | Admitting: Orthotics

## 2020-02-10 DIAGNOSIS — I4892 Unspecified atrial flutter: Secondary | ICD-10-CM

## 2020-02-10 DIAGNOSIS — Z5181 Encounter for therapeutic drug level monitoring: Secondary | ICD-10-CM

## 2020-02-10 DIAGNOSIS — I4891 Unspecified atrial fibrillation: Secondary | ICD-10-CM | POA: Diagnosis not present

## 2020-02-10 DIAGNOSIS — F331 Major depressive disorder, recurrent, moderate: Secondary | ICD-10-CM

## 2020-02-10 DIAGNOSIS — E1142 Type 2 diabetes mellitus with diabetic polyneuropathy: Secondary | ICD-10-CM

## 2020-02-10 LAB — POCT INR: INR: 3.1 — AB (ref 2.0–3.0)

## 2020-02-10 NOTE — Patient Instructions (Signed)
Description   Today take 2 tablets then start taking warfarin 2 tablets daily except 3 tablets on Sundays and Thursdays. Recheck in 3 weeks. Call [facility] with any new medications [phone].

## 2020-02-17 ENCOUNTER — Other Ambulatory Visit: Payer: Self-pay | Admitting: *Deleted

## 2020-02-17 NOTE — Patient Outreach (Signed)
[facility] [facility]) Care Management  02/17/2020  BEVELYN ARRIOLA 14-Jul-1923 [id number]   Call placed to member to follow up on surgical wound healing, no answer.  Unable to leave message, mailbox is full.  Will follow up within the next 3-4 business days.  [name], [place], MSN [place] [phone]

## 2020-02-18 ENCOUNTER — Ambulatory Visit (INDEPENDENT_AMBULATORY_CARE_PROVIDER_SITE_OTHER): Payer: Medicare Other | Admitting: Podiatry

## 2020-02-18 ENCOUNTER — Encounter: Payer: Self-pay | Admitting: Nurse Practitioner

## 2020-02-18 ENCOUNTER — Other Ambulatory Visit: Payer: Self-pay

## 2020-02-18 DIAGNOSIS — L97512 Non-pressure chronic ulcer of other part of right foot with fat layer exposed: Secondary | ICD-10-CM | POA: Diagnosis not present

## 2020-02-18 DIAGNOSIS — E0859 Diabetes mellitus due to underlying condition with other circulatory complications: Secondary | ICD-10-CM

## 2020-02-18 NOTE — Progress Notes (Signed)
Subjective:  Patient presents today status post transmetatarsal amputation of the right foot. DOS: 11/14/2019.  Patient continues to experience some pain and tenderness to the lateral aspect of the right forefoot.  She has been dressing the foot with antibiotic ointment and wearing the surgical shoe as directed.  No new complaints at this time  Past Medical History:  Diagnosis Date  . Acute respiratory failure ([place])   . Anemia    previous blood transfusions  . Arthritis    "all over"  . Asthma   . Atrial flutter ([place])   . Bradycardia    requiring previous d/c of BB and reduction of amiodarone  . CAD (coronary artery disease)    nonobstructive per notes  . Chronic diastolic CHF (congestive heart failure) ([place])   . CKD (chronic kidney disease), stage III ([place])   . Complication of blood transfusion    "got the wrong blood type at [facility] in ~ 2015; no adverse reaction that we are aware of"/daughter, [name] (01/27/2016)  . COPD (chronic obstructive pulmonary disease) ([place])   . Depression    "light case"  . DVT (deep venous thrombosis) ([place]) 01/2016   a. LLE DVT 01/2016 - switched from Eliquis to Coumadin.  [name] Dyspnea    with some activity  . Gastric stenosis    a. s/p stomach tube  . GERD (gastroesophageal reflux disease)   . Headache   . History of 2019 novel coronavirus disease (COVID-19)   . History of blood transfusion    "several" (01/27/2016)  . History of stomach ulcers   . Hyperlipidemia   . Hypertension   . Hypothyroidism   . On home oxygen therapy    "2L; 9PM til 9AM" (06/27/2017)  . PAD (peripheral artery disease) ([place])    a. prior gangrene, toe amputations, intervention.  [name] PAF (paroxysmal atrial fibrillation) ([place])   . Paraesophageal hernia   . Perforated gastric ulcer ([place])   . Pneumonia    "a few times" (06/27/2017)  . Seasonal allergies   . SIADH (syndrome of inappropriate ADH production) ([place])    [name] 01/10/2015  . Small bowel obstruction ([place])    "I  don't know how many" (01/11/2015)  . Stroke [facility])    several- no residual  . Type II diabetes mellitus ([place])    "related to prednisone use  for > 20 yr; once predinose stopped; no more DM RX" (01/27/2016)  . UTI (urinary tract infection) 02/08/2016  . Ventral hernia with bowel obstruction     Objective: Physical Exam General: The patient is alert and oriented x3 in no acute distress.  Dermatology: Skin is cool, dry and supple bilateral lower extremities.  Amputation stump appears to be healed nicely.  There is a small superficial ulcer to the lateral aspect of the amputation stump.  It measures approximately 0.5 x 0.5 x 0.1 cm.  To the noted ulceration there is no eschar.  There is a minimal amount of slough fibrin and necrotic tissue noted.  Wound base and granulation tissue is red.  No malodor noted.  Minimal amount of serosanguineous drainage noted.  Periwound integrity is intact  There is also some preulcerative callus tissue noted to the left foot bunion.  Occasionally this needs to be trimmed.  Today she is requesting to have it trimmed.  Vascular: Palpable pedal pulses bilaterally.  Negative for any significant erythema or edema to the leg.  Neurological: Epicritic and protective threshold grossly intact bilaterally.   Musculoskeletal Exam: Status post  TMA right foot.  Hallux valgus left.    Assessment: 1. s/p transmetatarsal amputation right foot. DOS: 11/14/2019 2.  Ulcer right lateral forefoot amputation stump 3.  Cellulitis left leg-resolved Plan of Care:  1. Patient was evaluated.  2.  Medically necessary excisional debridement including subcutaneous tissue was performed using a tissue nipper with post debridement measurement same as pre-.  Excisional debridement of all the necrotic nonviable tissue down to the healthy bleeding viable tissue was performed 3.  Prisma collagen dressing was provided.  Apply every other day 4.  Custom molded orthotics pending 5.  Excisional  debridement of the preulcerative callus tissue overlying the bunion of the left foot was performed using a chisel blade without incident or bleeding.   6.  Return to clinic in 3 weeks  Edrick Kins, DPM [facility]  Dr. Edrick Kins, DPM    [address], [place] [address]                Office [phone]  Fax [phone]

## 2020-02-19 ENCOUNTER — Ambulatory Visit: Payer: Medicare Other | Admitting: Nurse Practitioner

## 2020-02-19 ENCOUNTER — Ambulatory Visit (INDEPENDENT_AMBULATORY_CARE_PROVIDER_SITE_OTHER): Payer: Medicare Other | Admitting: Nurse Practitioner

## 2020-02-19 DIAGNOSIS — Z89431 Acquired absence of right foot: Secondary | ICD-10-CM

## 2020-02-19 DIAGNOSIS — I739 Peripheral vascular disease, unspecified: Secondary | ICD-10-CM | POA: Diagnosis not present

## 2020-02-19 DIAGNOSIS — E1142 Type 2 diabetes mellitus with diabetic polyneuropathy: Secondary | ICD-10-CM

## 2020-02-19 DIAGNOSIS — F325 Major depressive disorder, single episode, in full remission: Secondary | ICD-10-CM

## 2020-02-19 DIAGNOSIS — I70235 Atherosclerosis of native arteries of right leg with ulceration of other part of foot: Secondary | ICD-10-CM | POA: Diagnosis not present

## 2020-02-19 DIAGNOSIS — Z931 Gastrostomy status: Secondary | ICD-10-CM

## 2020-02-19 DIAGNOSIS — B372 Candidiasis of skin and nail: Secondary | ICD-10-CM | POA: Diagnosis not present

## 2020-02-19 MED ORDER — NYSTATIN 100000 UNIT/GM EX CREA: 1.0000 "application " | TOPICAL_CREAM | Freq: Two times a day (BID) | CUTANEOUS | 0 refills | Status: DC

## 2020-02-19 NOTE — Progress Notes (Signed)
This service is provided via telemedicine  No vital signs collected/recorded due to the encounter was a telemedicine visit.   Location of patient (ex: home, work):  Home  Patient consents to a telephone visit:  Yes, see encounter dated 10/13/2019  Location of the provider (ex: office, home):  [place]  Name of any referring provider:  N/A  Names of all persons participating in the telemedicine service and their role in the encounter:  Sherrie Mustache, Nurse Practitioner, Carroll Kinds, CMA, and patient.   Time spent on call:  10 minutes with medical assistant      Careteam: Patient Care Team: [name], NP as PCP - General (Geriatric Medicine) Dorothy Spark, MD as PCP - Cardiology (Cardiology) Edrick Kins, DPM as Consulting Physician (Podiatry) Dorothy Spark, MD as Consulting Physician (Cardiology) [name], NP as Nurse Practitioner ([place]) Camillo Flaming, [place] as Referring Physician ([place]) [name], RN as [place] Management  Advanced Directive information    Allergies  Allergen Reactions  . Penicillins Itching, Rash and Other (See Comments)    Tolerated amoxicillin, unasyn, zosyn & cephalosporins in the past. Did it involve swelling of the face/tongue/throat, SOB, or low BP? No Did it involve sudden or severe rash/hives, skin peeling, or any reaction on the inside of your mouth or nose? No Did you need to seek medical attention at a hospital or doctor's office? Unknown When did it last happen?5+ years If all above answers are "NO", may proceed with cephalosporin use.     Chief Complaint  Patient presents with  . Acute Visit    4 week follow up on neuropathy.Daughter [name] on call with patient today. Patient was stareted on new medication for neuropathy. Medication seems to be helping. Patient thinks ir may be more than neuropathy. Patient states her feet are sore.  Patient went to foot doctor yesterday and has bunions on toes. Feet are feeling numb. She is having tingling that goes up leg.     HPI: Patient is a 86 y.o. female for follow up on neuropathy.  At last visit she was changed to cymbalta from zoloft.  Leg stings and neuropathy still there but she is now able to sleep better at night with less pain.   Reports she went to podiatrist yesterday- toes are not healing that were amputated.   Depression and anxiety remain controlled.   PEG site has been red recently and with pain when flushed, radiologist told her she had a hernia that moves around and causes pain at times.   Review of Systems:  Review of Systems  Constitutional: Negative for chills, fever, malaise/fatigue and weight loss.  Respiratory: Negative for cough, sputum production and shortness of breath.   Cardiovascular: Negative for chest pain, palpitations and leg swelling.  Gastrointestinal: Negative for abdominal pain, constipation, diarrhea and heartburn.  Genitourinary: Negative for dysuria, frequency and urgency.  Musculoskeletal: Positive for joint pain and myalgias. Negative for back pain and falls.  Skin: Positive for rash.       Redness and irritation around PEG site  Psychiatric/Behavioral: Negative for depression and memory loss. The patient is not nervous/anxious and does not have insomnia.     Past Medical History:  Diagnosis Date  . Acute respiratory failure ([place])   . Anemia    previous blood transfusions  . Arthritis    "all over"  . Asthma   . Atrial flutter ([place])   . Bradycardia  requiring previous d/c of BB and reduction of amiodarone  . CAD (coronary artery disease)    nonobstructive per notes  . Chronic diastolic CHF (congestive heart failure) ([place])   . CKD (chronic kidney disease), stage III ([place])   . Complication of blood transfusion    "got the wrong blood type at [facility] in ~ 2015; no adverse reaction that we are aware of"/daughter, [name]  (01/27/2016)  . COPD (chronic obstructive pulmonary disease) ([place])   . Depression    "light case"  . DVT (deep venous thrombosis) ([place]) 01/2016   a. LLE DVT 01/2016 - switched from Eliquis to Coumadin.  [name] Dyspnea    with some activity  . Gastric stenosis    a. s/p stomach tube  . GERD (gastroesophageal reflux disease)   . Headache   . History of 2019 novel coronavirus disease (COVID-19)   . History of blood transfusion    "several" (01/27/2016)  . History of stomach ulcers   . Hyperlipidemia   . Hypertension   . Hypothyroidism   . On home oxygen therapy    "2L; 9PM til 9AM" (06/27/2017)  . PAD (peripheral artery disease) ([place])    a. prior gangrene, toe amputations, intervention.  [name] PAF (paroxysmal atrial fibrillation) ([place])   . Paraesophageal hernia   . Perforated gastric ulcer ([place])   . Pneumonia    "a few times" (06/27/2017)  . Seasonal allergies   . SIADH (syndrome of inappropriate ADH production) ([place])    [name] 01/10/2015  . Small bowel obstruction ([place])    "I don't know how many" (01/11/2015)  . Stroke [facility])    several- no residual  . Type II diabetes mellitus ([place])    "related to prednisone use  for > 20 yr; once predinose stopped; no more DM RX" (01/27/2016)  . UTI (urinary tract infection) 02/08/2016  . Ventral hernia with bowel obstruction    Past Surgical History:  Procedure Laterality Date  . ABDOMINAL AORTOGRAM N/A 09/21/2016   Procedure: Abdominal Aortogram;  Surgeon: Waynetta Sandy, MD;  Location: [place] [facility];  Service: Cardiovascular;  Laterality: N/A;  . AMPUTATION Left 09/25/2016   Procedure: AMPUTATION DIGIT- LEFT 2ND AND 3RD TOES;  Surgeon: Rosetta Posner, MD;  Location: [place];  Service: Vascular;  Laterality: Left;  . AMPUTATION Right 05/03/2018   Procedure: AMPUTATION RIGHT GREAT TOE;  Surgeon: Rosetta Posner, MD;  Location: [place];  Service: Vascular;  Laterality: Right;  . CATARACT EXTRACTION W/ INTRAOCULAR LENS  IMPLANT, BILATERAL    .  CHOLECYSTECTOMY OPEN    . COLECTOMY    . ESOPHAGOGASTRODUODENOSCOPY N/A 01/19/2014   Procedure: ESOPHAGOGASTRODUODENOSCOPY (EGD);  Surgeon: Irene Shipper, MD;  [facility];  Service: Endoscopy;  Laterality: N/A;  . ESOPHAGOGASTRODUODENOSCOPY N/A 01/20/2014   Procedure: ESOPHAGOGASTRODUODENOSCOPY (EGD);  Surgeon: Irene Shipper, MD;  Location: [facility];  Service: Endoscopy;  Laterality: N/A;  . ESOPHAGOGASTRODUODENOSCOPY N/A 03/19/2014   Procedure: ESOPHAGOGASTRODUODENOSCOPY (EGD);  Surgeon: Milus Banister, MD;  Location: [facility];  Service: Endoscopy;  Laterality: N/A;  . ESOPHAGOGASTRODUODENOSCOPY N/A 07/08/2015   Procedure: ESOPHAGOGASTRODUODENOSCOPY (EGD);  Surgeon: Doran Stabler, MD;  Location: [facility] ENDOSCOPY;  Service: Endoscopy;  Laterality: N/A;  . ESOPHAGOGASTRODUODENOSCOPY (EGD) WITH PROPOFOL N/A 09/15/2015   Procedure: ESOPHAGOGASTRODUODENOSCOPY (EGD) WITH PROPOFOL;  Surgeon: Manus Gunning, MD;  [facility];  Service: Gastroenterology;  Laterality: N/A;  . GASTROJEJUNOSTOMY     hx/notes 01/10/2015  . GASTROJEJUNOSTOMY N/A 09/23/2015   Procedure:  OPEN GASTROJEJUNOSTOMY TUBE PLACEMENT;  Surgeon: Arta Bruce Kinsinger, MD;  [facility];  Service: General;  Laterality: N/A;  . GLAUCOMA SURGERY Bilateral   . HERNIA REPAIR  2015  . IR CM INJ ANY COLONIC TUBE W/FLUORO  01/05/2017  . IR CM INJ ANY COLONIC TUBE W/FLUORO  06/07/2017  . IR CM INJ ANY COLONIC TUBE W/FLUORO  11/05/2017  . IR CM INJ ANY COLONIC TUBE W/FLUORO  09/18/2018  . IR CM INJ ANY COLONIC TUBE W/FLUORO  03/06/2019  . IR CM INJ ANY COLONIC TUBE W/FLUORO  03/11/2019  . IR GENERIC HISTORICAL  01/07/2016   IR GJ TUBE CHANGE 01/07/2016 Jacqulynn Cadet, MD [facility]  . IR GENERIC HISTORICAL  01/27/2016   IR MECH REMOV OBSTRUC MAT ANY COLON TUBE W/FLUORO 01/27/2016 Markus Daft, MD [facility]  . IR GENERIC HISTORICAL  02/07/2016   IR PATIENT EVAL TECH 0-60 MINS Darrell K Allred, PA-C [facility]  [facility]  . IR GENERIC HISTORICAL  02/08/2016   IR GJ TUBE CHANGE 02/08/2016 Greggory Keen, MD [facility]  . IR GENERIC HISTORICAL  01/06/2016   IR GJ TUBE CHANGE 01/06/2016 [facility] OUT REF  . IR GENERIC HISTORICAL  05/02/2016   IR CM INJ ANY COLONIC TUBE W/FLUORO 05/02/2016 Arne Cleveland, MD [facility]  . IR GENERIC HISTORICAL  05/15/2016   IR GJ TUBE CHANGE 05/15/2016 Sandi Mariscal, MD [facility]  . IR GENERIC HISTORICAL  06/28/2016   IR GJ TUBE CHANGE 06/28/2016 [facility]  . IR GJ TUBE CHANGE  02/20/2017  . IR GJ TUBE CHANGE  05/10/2017  . IR GJ TUBE CHANGE  07/06/2017  . IR GJ TUBE CHANGE  08/02/2017  . IR GJ TUBE CHANGE  10/15/2017  . IR GJ TUBE CHANGE  12/26/2017  . IR [place] TUBE CHANGE  04/08/2018  . IR [place] TUBE CHANGE  06/19/2018  . IR [place] TUBE CHANGE  03/03/2019  . IR [place] TUBE CHANGE  06/06/2019  . IR GJ TUBE CHANGE  09/26/2019  . IR GJ TUBE CHANGE  01/09/2020  . IR PATIENT EVAL TECH 0-60 MINS  10/19/2016  . IR PATIENT EVAL TECH 0-60 MINS  12/25/2016  . IR PATIENT EVAL TECH 0-60 MINS  01/29/2017  . IR PATIENT EVAL TECH 0-60 MINS  04/04/2017  . IR PATIENT EVAL TECH 0-60 MINS  04/30/2017  . IR PATIENT EVAL TECH 0-60 MINS  07/31/2017  . IR REPLC DUODEN/JEJUNO TUBE PERCUT W/FLUORO  11/14/2016  . LAPAROTOMY N/A 01/20/2015   Procedure: EXPLORATORY LAPAROTOMY;  Surgeon: Coralie Keens, MD;  Location: [place];  Service: General;  Laterality: N/A;  . LOWER EXTREMITY ANGIOGRAPHY Left 09/21/2016   Procedure: Lower Extremity Angiography;  Surgeon: Waynetta Sandy, MD;  Location: [place] [facility];  Service: Cardiovascular;  Laterality: Left;  . LOWER EXTREMITY ANGIOGRAPHY Right 06/27/2017   Procedure: Lower Extremity Angiography;  Surgeon: Waynetta Sandy, MD;  Location: [place] [facility];  Service: Cardiovascular;  Laterality: Right;  . LYSIS OF ADHESION N/A 01/20/2015   Procedure: LYSIS OF ADHESIONS < 1 HOUR;  Surgeon: Coralie Keens, MD;  Location: [place];  Service: General;  Laterality:  N/A;  . PERIPHERAL VASCULAR BALLOON ANGIOPLASTY Left 09/21/2016   Procedure: Peripheral Vascular Balloon Angioplasty;  Surgeon: Waynetta Sandy, MD;  Location: [place] [facility];  Service: Cardiovascular;  Laterality: Left;  drug coated balloon  . PERIPHERAL VASCULAR BALLOON ANGIOPLASTY Right 06/27/2017   Procedure: PERIPHERAL VASCULAR BALLOON ANGIOPLASTY;  Surgeon: Waynetta Sandy, MD;  Location: [place] [facility];  Service: Cardiovascular;  Laterality: Right;  SFA/[id number]  . TONSILLECTOMY    . TRANSMETATARSAL AMPUTATION Right 11/14/2019   Procedure: TRANSMETATARSAL AMPUTATION RIGHT FOOT;  Surgeon: Edrick Kins, DPM;  Location: [place];  Service: Podiatry;  Laterality: Right;  . TUBAL LIGATION    . VENTRAL HERNIA REPAIR  2015   incarcerated ventral hernia ([facility] 09/2013)/notes 01/10/2015   Social History:   reports that she has never smoked. She quit smokeless tobacco use about 39 years ago.  Her smokeless tobacco use included snuff. She reports that she does not drink alcohol and does not use drugs.  Family History  Problem Relation Age of Onset  . Stroke Mother   . Hypertension Mother   . Diabetes Brother   . Glaucoma Son   . Glaucoma Son   . Heart attack Neg Hx   . Colon cancer Neg Hx   . Stomach cancer Neg Hx     Medications: Patient's Medications  New Prescriptions   No medications on file  Previous Medications   ACETAMINOPHEN (TYLENOL) 500 MG TABLET    Take 500-1,000 mg by mouth every 6 (six) hours as needed (pain).    AMINO ACIDS-PROTEIN HYDROLYS (FEEDING SUPPLEMENT, PRO-STAT SUGAR FREE 64,) LIQD    Take 30 mLs by mouth 2 (two) times daily.   AMLODIPINE (NORVASC) 5 MG TABLET    Take 1 tablet (5 mg total) by mouth daily.   ATORVASTATIN (LIPITOR) 10 MG TABLET    TAKE 1 TABLET BY MOUTH EVERY DAY   B COMPLEX VITAMINS TABLET    Take 1 tablet by mouth daily.    BENZOCAINE (AMERICAINE) 20 % RECTAL OINTMENT    Place rectally every 3 (three) hours as needed for  pain.   CALCIUM-VITAMIN D (OSCAL WITH D) 500-200 MG-UNIT PER TABLET    Take 1 tablet by mouth daily with breakfast.    CARBOXYMETHYLCELLULOSE SODIUM (ARTIFICIAL TEARS OP)    Place 1 drop into both eyes 4 (four) times daily.   CYCLOSPORINE (RESTASIS) 0.05 % OPHTHALMIC EMULSION    USE 1 DROP INTO BOTH EYES TWICE DAILY   DULERA 200-5 MCG/ACT AERO    TAKE TWO PUFFS BY MOUTH TWICE DAILY   DULOXETINE (CYMBALTA) 30 MG CAPSULE    TAKE 1 CAPSULE BY MOUTH EVERY DAY   FERROUS SULFATE 220 (44 FE) MG/5ML SOLUTION    TAKE 7.4 MLS (325 MG TOTAL) BY MOUTH DAILY.   FLUTICASONE (FLONASE) 50 MCG/ACT NASAL SPRAY    SPRAY 2 SPRAYS INTO EACH NOSTRIL EVERY DAY   FUROSEMIDE (LASIX) 20 MG TABLET    Take 1 tablet (20 mg total) by mouth daily.   FUROSEMIDE (LASIX) 40 MG TABLET    Take 20-40 mg by mouth See admin instructions. Take 20 mg daily, may instead take 40 mg daily as needed for edema   GABAPENTIN (NEURONTIN) 250 MG/5ML SOLUTION    TAKE 6 MLS (300 MG TOTAL) BY MOUTH 2 (TWO) TIMES DAILY.   GENTAMICIN CREAM (GARAMYCIN) 0.1 %    Apply 1 application topically daily as needed (dry skin).   HYDRALAZINE (APRESOLINE) 50 MG TABLET    TAKE 1 TABLET BY MOUTH THREE TIMES A DAY   INSULIN ASPART (NOVOLOG) 100 UNIT/ML FLEXPEN    Inject into the skin as needed for high blood sugar. New sliding Scale If BS is 151 - 251 = 2 units,  If BS is 251 -  350 = 4 units,  If BS is 351 - 450 =  6 units Call provider if BS runs more than 400   INSULIN GLARGINE (LANTUS SOLOSTAR) 100 UNIT/ML SOLOSTAR PEN    Inject 8 Units into the skin at bedtime.   INSULIN PEN NEEDLE (B-D UF III MINI PEN NEEDLES) 31G X 5 MM MISC    Use twice daily with giving insulin injections. Dx E11.9   IPRATROPIUM-ALBUTEROL (DUONEB) 0.5-2.5 (3) MG/3ML SOLN    Take 3 mLs by nebulization every 6 (six) hours as needed (shortness of breath).   ISOSORBIDE MONONITRATE (IMDUR) 30 MG 24 HR TABLET    TAKE 2 TABLETS BY MOUTH DAILY. PLEASE MAKE ANNUAL APPT WITH DR. Meda Coffee FOR FUTURE  REFILLS   LATANOPROST (XALATAN) 0.005 % OPHTHALMIC SOLUTION    Place 1 drop into both eyes at bedtime.   LEVALBUTEROL (XOPENEX HFA) 45 MCG/ACT INHALER    Inhale 2 puffs into the lungs every 6 (six) hours as needed for wheezing.   LEVOTHYROXINE (SYNTHROID) 125 MCG TABLET    TAKE 1 TABLET BY MOUTH EVERY DAY   LORATADINE (CLARITIN) 10 MG TABLET    Take 10 mg by mouth daily as needed for allergies.   MULTIPLE VITAMIN (MULTIVITAMIN WITH MINERALS) TABS TABLET    Take 1 tablet by mouth daily.   NUTRITIONAL SUPPLEMENTS (FEEDING SUPPLEMENT, OSMOLITE 1.5 CAL,) LIQD    Place 711 mLs into feeding tube See admin instructions. Take 711 ml over a 12 hour period per day   NYSTATIN (MYCOSTATIN/NYSTOP) POWDER    Apply 1 application topically as needed (skin irritation).   OXYCODONE (ROXICODONE) 5 MG IMMEDIATE RELEASE TABLET    Take 1 tablet (5 mg total) by mouth every 4 (four) hours as needed for severe pain. May take 1/2 tab q4hrs as needed for mild pain.   PANTOPRAZOLE (PROTONIX) 40 MG TABLET    TAKE 1 TABLET BY MOUTH EVERY DAY   POLYETHYLENE GLYCOL (MIRALAX / GLYCOLAX) 17 G PACKET    Take 17 g by mouth as needed.   PROBIOTIC PRODUCT (EQL PROBIOTIC COLON SUPPORT PO)    Take 1 capsule by mouth daily.   SENNA-DOCUSATE (SENOKOT-S) 8.6-50 MG TABLET    Take 1 tablet by mouth as needed for mild constipation.   WARFARIN (COUMADIN) 2.5 MG TABLET    TAKE 2-3 TABLETS BY MOUTH DAILY AS DIRECTED BY COUMADIN CLINIC.   WATER FOR IRRIGATION, STERILE (FREE WATER) SOLN    Place 180 mLs into feeding tube 3 (three) times daily.    WHITE PETROLATUM-MINERAL OIL (SYSTANE NIGHTTIME) OINT    Place 1 application into both eyes at bedtime.   Modified Medications   No medications on file  Discontinued Medications   No medications on file    Physical Exam:  There were no vitals filed for this visit. There is no height or weight on file to calculate BMI. Wt Readings from Last 3 Encounters:  01/21/20 136 lb (61.7 kg)  12/09/19 133 lb  12.8 oz (60.7 kg)  11/14/19 129 lb (58.5 kg)     Labs reviewed: Basic Metabolic Panel: Recent Labs    05/18/19 1925 05/18/19 2355 07/07/19 0123 07/08/19 0539 08/27/19 1239 08/27/19 2016 08/28/19 0101 09/11/19 1424 09/24/19 2000 11/07/19 1454 11/15/19 0506  NA 134*   < >  --    < >   < >  --  137   < > 136 139 134*  K 4.3   < >  --    < >   < >  --  4.6   < >  4.6 4.4 4.3  CL 101   < >  --    < >   < >  --  103   < > 103 103 100  CO2 24   < >  --    < >   < >  --  23   < > 23 28 24   GLUCOSE 80   < >  --    < >   < >  --  93   < > 98 76 162*  BUN 60*   < >  --    < >   < >  --  44*   < > 49* 52* 37*  CREATININE 1.75*   < >  --    < >   < >  --  1.34*   < > 1.33* 1.41* 1.37*  CALCIUM 9.4   < >  --    < >   < >  --  9.7   < > 9.0 9.5 9.3  MG 2.2  --   --   --   --  2.3  --   --   --   --  2.1  PHOS  --   --   --   --   --   --   --   --   --   --  4.4  TSH  --   --  1.623  --   --   --  1.428  --   --   --   --    < > = values in this interval not displayed.   Liver Function Tests: Recent Labs    08/15/19 0519 08/15/19 0519 09/24/19 2000 11/07/19 1454 11/15/19 0506  AST 25   < > 21 24 25   ALT 19   < > 17 16 18   ALKPHOS 70  --  75  --  78  BILITOT 0.1*   < > 0.7 0.4 0.7  PROT 7.0   < > 7.6 7.5 7.0  ALBUMIN 3.4*  --  3.4*  --  3.0*   < > = values in this interval not displayed.   Recent Labs    07/07/19 0122 08/15/19 0519 09/24/19 2000  LIPASE 26 30 24    No results for input(s): AMMONIA in the last 8760 hours. CBC: Recent Labs    09/24/19 2000 11/07/19 1454 11/15/19 0506  WBC 6.7 5.1 4.9  NEUTROABS 4.3 2,917 4.4  HGB 9.1* 9.7* 9.3*  HCT 26.8* 29.9* 27.7*  MCV 91.5 91.7 90.2  PLT 240 223 193   Lipid Panel: No results for input(s): CHOL, HDL, LDLCALC, TRIG, CHOLHDL, LDLDIRECT in the last 8760 hours. TSH: Recent Labs    07/07/19 0123 08/28/19 0101  TSH 1.623 1.428   A1C: Lab Results  Component Value Date   HGBA1C 6.3 (H) 10/23/2019      Assessment/Plan 1. PAD (peripheral artery disease) (HCC) S/p multiple toe amputations of right foot. With slow healing. Followed by podiatrist.   2. Diabetic polyneuropathy associated with type 2 diabetes mellitus ([place]) Ongoing, cymbalta providing some benefit in regards to neuropathic pain.to continue gabapentin and cymbalta.    3. History of transmetatarsal amputation of right foot ([place]) Recent excisional debridement of toes due to necrotic tissue. custom molded orthotics have been ordered. Continue collagen dressing every other day.   4. Depression, major, single episode, complete remission ([place]) Stable doing well with transition to Cymbalta, will continue current regimen.  5. PEG (percutaneous endoscopic gastrostomy) status ([place]) Noted to have redness around site, limited assessment due to video will treat for yeast. To clean area twice daily and apply cream to site. If redness persist to make appt to be evaluated in office  6. Yeast infection of the skin - nystatin cream (MYCOSTATIN); Apply 1 application topically 2 (two) times daily.  Dispense: 30 g; Refill: 0  Next appt: 3 months, sooner if needed  Azoria Abbett K. Ramona, [facility] [phone]    Virtual Visit via my chart   I connected with patient on 02/19/20 at  2:45 PM EST by video  and verified that I am speaking with the correct person using two identifiers.  Location: Patient: home Provider: [place]    I discussed the limitations, risks, security and privacy concerns of performing an evaluation and management service by telephone and the availability of in person appointments. I also discussed with the patient that there may be a patient responsible charge related to this service. The patient expressed understanding and agreed to proceed.   I discussed the assessment and treatment plan with the patient. The patient was provided an opportunity to ask questions and all were  answered. The patient agreed with the plan and demonstrated an understanding of the instructions.   The patient was advised to call back or seek an in-person evaluation if the symptoms worsen or if the condition fails to improve as anticipated.  I provided 26 minutes of non-face-to-face time during this encounter.  [name]. Harle Battiest Avs printed and mailed

## 2020-02-22 ENCOUNTER — Other Ambulatory Visit: Payer: Self-pay | Admitting: Nurse Practitioner

## 2020-02-23 ENCOUNTER — Other Ambulatory Visit: Payer: Self-pay | Admitting: *Deleted

## 2020-02-23 NOTE — Patient Outreach (Signed)
[facility] [facility]) Care Management  02/23/2020  MELISIA LEMING Sep 11, 1923 [id number]   Outreach attempt #2, successful.  Member report she had follow up at podiatrist on 11/17, left leg cellulitis no longer a concern, right foot continues to heal.  Debridement was performed in the office, continues to perform dressing changes every other day with collagen.  She also continue to use antibiotic ointment.  Scheduled for follow up on 12/8.  She has been using Tylenol to help relieve the pain.  Otherwise, denies issues.  Report blood sugars and blood pressure have all been stable, blood sugar 136 today.  She will continue to self monitor.  Denies any urgent issues, encouraged to contact this care manager.  Agrees to follow up within the next month.  Goals Addressed            This Visit's Progress   . [facility] - Make and Keep All Appointments   On track    Follow Up Date 12/21   - ask family or friend for a ride - call to cancel if needed - keep a calendar with appointment dates    Why is this important?   Part of staying healthy is seeing the doctor for follow-up care.  If you forget your appointments, there are some things you can do to stay on track.    Notes:   11/22 - Upcoming appointments reviewed with member    . [name] - Perform Foot Care   On track    Follow Up Date 12/15   - check feet daily for cuts, sores or redness - wash and dry feet carefully every day - wear comfortable, cotton socks - wear comfortable, well-fitting shoes    Why is this important?   Good foot care is very important when you have diabetes.  There are many things you can do to keep your feet healthy and catch a problem early.    Notes:   11/22 - Reviewed updated wound care per most recent MD visit        [name], RN, MSN [place] Manager [phone]

## 2020-02-25 ENCOUNTER — Other Ambulatory Visit: Payer: Self-pay | Admitting: Nurse Practitioner

## 2020-03-02 ENCOUNTER — Other Ambulatory Visit: Payer: Self-pay

## 2020-03-02 ENCOUNTER — Ambulatory Visit (INDEPENDENT_AMBULATORY_CARE_PROVIDER_SITE_OTHER): Payer: Medicare Other | Admitting: *Deleted

## 2020-03-02 DIAGNOSIS — Z5181 Encounter for therapeutic drug level monitoring: Secondary | ICD-10-CM | POA: Diagnosis not present

## 2020-03-02 DIAGNOSIS — I4891 Unspecified atrial fibrillation: Secondary | ICD-10-CM | POA: Diagnosis not present

## 2020-03-02 DIAGNOSIS — I4892 Unspecified atrial flutter: Secondary | ICD-10-CM

## 2020-03-02 LAB — POCT INR: INR: 2.1 (ref 2.0–3.0)

## 2020-03-02 NOTE — Patient Instructions (Signed)
Description   Continue taking warfarin 2 tablets daily except 3 tablets on Sundays and Thursdays. Recheck in 4 weeks. Call Coumadin Clinic with any new medications [phone].

## 2020-03-03 ENCOUNTER — Other Ambulatory Visit: Payer: Self-pay | Admitting: Nurse Practitioner

## 2020-03-03 DIAGNOSIS — D638 Anemia in other chronic diseases classified elsewhere: Secondary | ICD-10-CM

## 2020-03-08 DIAGNOSIS — H16223 Keratoconjunctivitis sicca, not specified as Sjogren's, bilateral: Secondary | ICD-10-CM | POA: Diagnosis not present

## 2020-03-08 DIAGNOSIS — H16143 Punctate keratitis, bilateral: Secondary | ICD-10-CM | POA: Diagnosis not present

## 2020-03-10 ENCOUNTER — Encounter: Payer: Medicare Other | Admitting: Podiatry

## 2020-03-10 ENCOUNTER — Other Ambulatory Visit: Payer: Self-pay | Admitting: Nurse Practitioner

## 2020-03-10 DIAGNOSIS — F331 Major depressive disorder, recurrent, moderate: Secondary | ICD-10-CM

## 2020-03-10 DIAGNOSIS — E1142 Type 2 diabetes mellitus with diabetic polyneuropathy: Secondary | ICD-10-CM

## 2020-03-15 ENCOUNTER — Ambulatory Visit (INDEPENDENT_AMBULATORY_CARE_PROVIDER_SITE_OTHER): Payer: Medicare Other | Admitting: Podiatry

## 2020-03-15 ENCOUNTER — Other Ambulatory Visit: Payer: Self-pay

## 2020-03-15 ENCOUNTER — Ambulatory Visit (INDEPENDENT_AMBULATORY_CARE_PROVIDER_SITE_OTHER): Payer: Medicare Other

## 2020-03-15 DIAGNOSIS — L97512 Non-pressure chronic ulcer of other part of right foot with fat layer exposed: Secondary | ICD-10-CM

## 2020-03-15 DIAGNOSIS — E0859 Diabetes mellitus due to underlying condition with other circulatory complications: Secondary | ICD-10-CM | POA: Diagnosis not present

## 2020-03-15 DIAGNOSIS — Z9889 Other specified postprocedural states: Secondary | ICD-10-CM

## 2020-03-17 IMAGING — XA IR REPLACE G/J TUBE W/ FLUORO
1 series · 3 of 3 positions shown · non-contrast
Comparison: none

INDICATION: Long-term indwelling gastrojejunostomy catheter. Hub is broken
externally. Exchange requested.

[Series 300: tube placements · 3 of 3 slices shown]
[im 1/3]
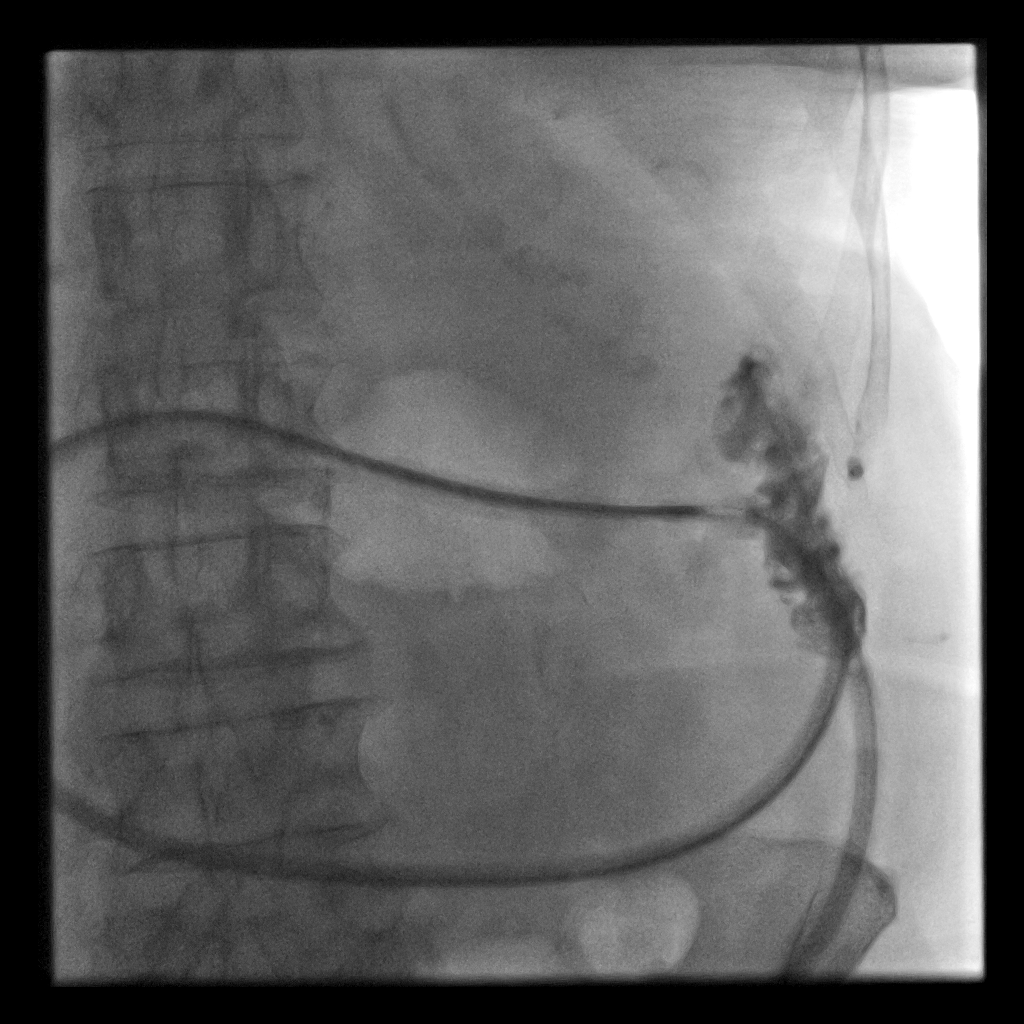
[im 2/3]
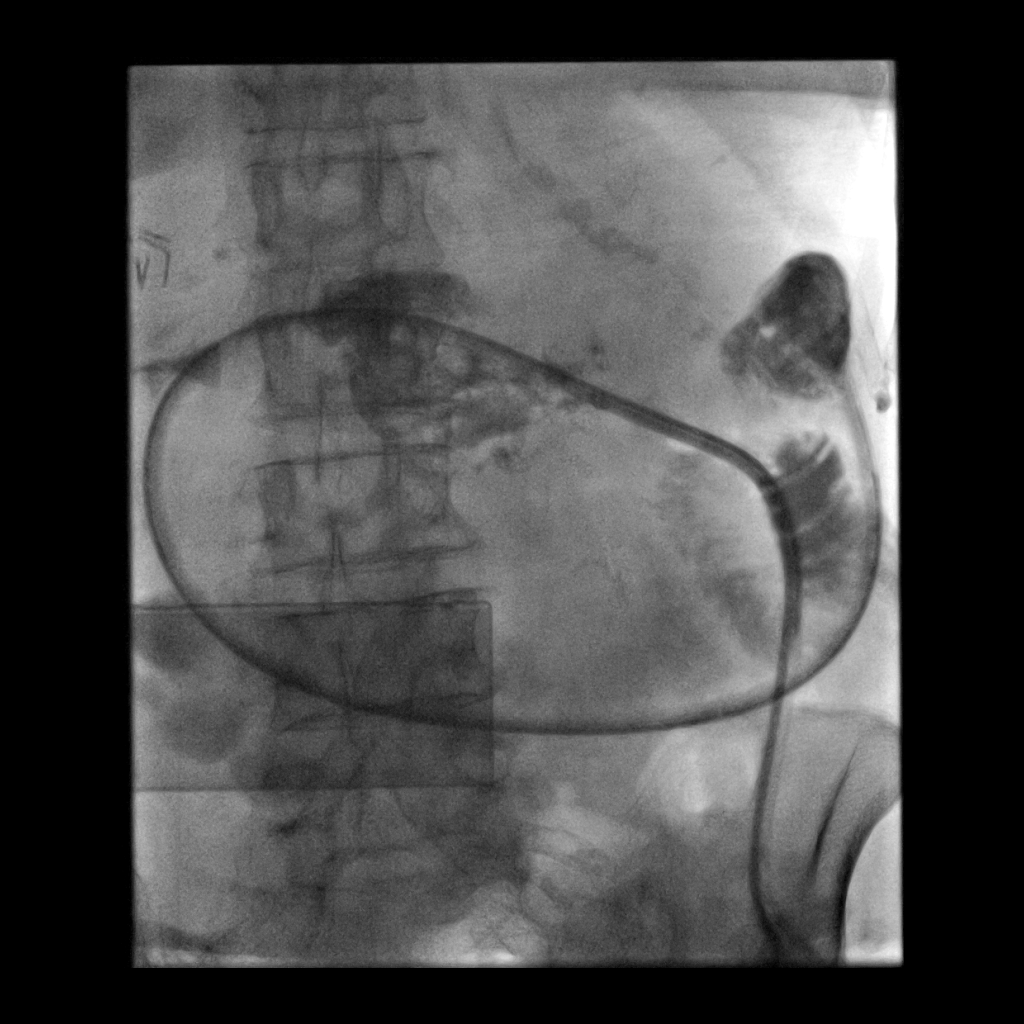
[im 3/3]
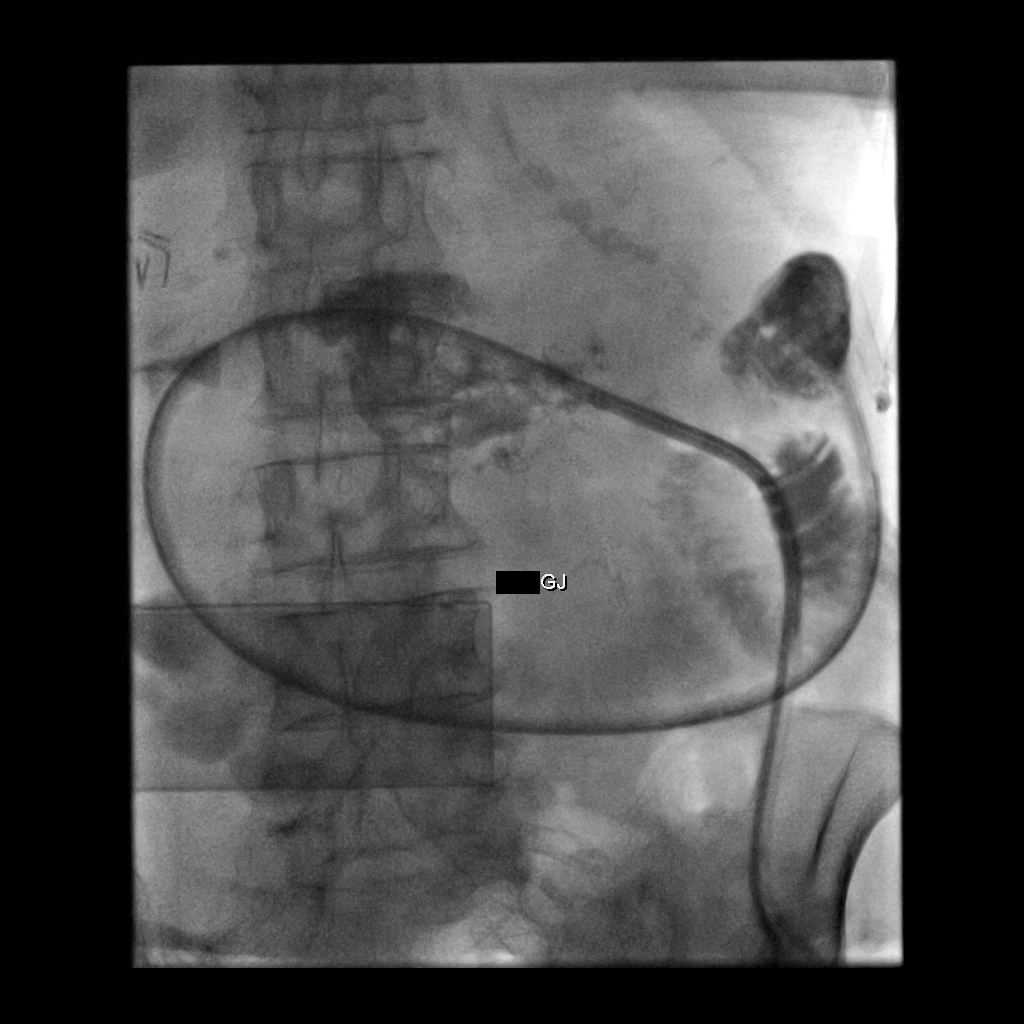

[3 of 3 positions shown; findings below may reference images not displayed]

EXAM:
Exchange of dual-lumen gastrojejunal catheter under fluoroscopy

MEDICATIONS:
Viscous lidocaine topically at the skin insertion site

ANESTHESIA/SEDATION:
None required

CONTRAST:  15 mL Gsovue-500-administered into the gastric lumen.

FLUOROSCOPY TIME:  1.2 minutes; 260  [id number] DAP

COMPLICATIONS:
None immediate.

PROCEDURE:
Informed written consent was obtained from the patient after a
thorough discussion of the procedural risks, benefits and
alternatives. All questions were addressed. Maximal Sterile Barrier
Technique was utilized including caps, mask, sterile gowns, sterile
gloves, sterile drape, hand hygiene and skin antiseptic. A timeout
was performed prior to the initiation of the procedure.

Fluoroscopic inspection demonstrated that the previously placed
catheter was in good position. Injection confirmed tip of the
catheter at the ligament of Treitz. The catheter was removed over a
Guidewire.   A new 22 French
dual-lumen gastrojejunostomy catheter was advanced over the
wire , positioned with the tip at the ligament of Treitz.  The
retention balloon was inflated. Injection of both lumens
demonstrates wide patency and adequate positioning. The external
bumper was applied and the catheter was flushed. The patient
tolerated the procedure well.
IMPRESSION: 1. Technically successful exchange of gastrojejunal catheter under
fluoroscopy

## 2020-03-17 NOTE — Progress Notes (Signed)
Subjective:  Patient presents today status post transmetatarsal amputation of the right foot. DOS: 11/14/2019.  Patient continues to experience some pain and tenderness to the lateral aspect of the right forefoot.  She has been dressing the foot with antibiotic ointment and wearing the surgical shoe as directed.  No new complaints at this time  Past Medical History:  Diagnosis Date  . Acute respiratory failure ([place])   . Anemia    previous blood transfusions  . Arthritis    "all over"  . Asthma   . Atrial flutter ([place])   . Bradycardia    requiring previous d/c of BB and reduction of amiodarone  . CAD (coronary artery disease)    nonobstructive per notes  . Chronic diastolic CHF (congestive heart failure) ([place])   . CKD (chronic kidney disease), stage III ([place])   . Complication of blood transfusion    "got the wrong blood type at [facility] in ~ 2015; no adverse reaction that we are aware of"/daughter, [name] (01/27/2016)  . COPD (chronic obstructive pulmonary disease) ([place])   . Depression    "light case"  . DVT (deep venous thrombosis) ([place]) 01/2016   a. LLE DVT 01/2016 - switched from Eliquis to Coumadin.  [name] Dyspnea    with some activity  . Gastric stenosis    a. s/p stomach tube  . GERD (gastroesophageal reflux disease)   . Headache   . History of 2019 novel coronavirus disease (COVID-19)   . History of blood transfusion    "several" (01/27/2016)  . History of stomach ulcers   . Hyperlipidemia   . Hypertension   . Hypothyroidism   . On home oxygen therapy    "2L; 9PM til 9AM" (06/27/2017)  . PAD (peripheral artery disease) ([place])    a. prior gangrene, toe amputations, intervention.  [name] PAF (paroxysmal atrial fibrillation) ([place])   . Paraesophageal hernia   . Perforated gastric ulcer ([place])   . Pneumonia    "a few times" (06/27/2017)  . Seasonal allergies   . SIADH (syndrome of inappropriate ADH production) ([place])    [name] 01/10/2015  . Small bowel obstruction ([place])    "I  don't know how many" (01/11/2015)  . Stroke [facility])    several- no residual  . Type II diabetes mellitus ([place])    "related to prednisone use  for > 20 yr; once predinose stopped; no more DM RX" (01/27/2016)  . UTI (urinary tract infection) 02/08/2016  . Ventral hernia with bowel obstruction     Objective: Physical Exam General: The patient is alert and oriented x3 in no acute distress.  Dermatology: Skin is cool, dry and supple bilateral lower extremities.  Amputation stump appears to be healed nicely.  The superficial ulcer to the distal stump of the amputation site appears to be healed.  Complete reepithelialization has occurred.  There is some very superficial skin breakdown limited to the dermis overlying the ends of the third fourth and fifth metatarsal bones.  Fat pad atrophy noted.  There is also some preulcerative callus tissue noted to the left foot bunion.  Occasionally this needs to be trimmed.  Today she is requesting to have it trimmed.  Vascular: Palpable pedal pulses bilaterally.  Negative for any significant erythema or edema to the leg.  Neurological: Epicritic and protective threshold grossly intact bilaterally.   Musculoskeletal Exam: Status post TMA right foot.  Hallux valgus left.  Radiographic exam: Distal transmetatarsal amputation right foot.  There is overlap with the postsurgical  shoe during x-ray however there is no clear evidence of osseous erosion or degenerative changes that would be concerning for osteomyelitis.  Assessment: 1. s/p transmetatarsal amputation right foot. DOS: 11/14/2019 2.  Ulcer right lateral forefoot amputation stump 3.  Ulcer left foot  Plan of Care:  1. Patient was evaluated.  2.  Medically necessary excisional debridement including subcutaneous tissue was performed using a tissue nipper with post debridement measurement same as pre-.  Excisional debridement of all the necrotic nonviable tissue down to the healthy bleeding viable tissue  was performed 3.  Continue Prisma collagen dressing to the left foot every other day.   4.  Custom molded orthotics pending 5.  Excisional debridement of the preulcerative callus tissue overlying the bunion of the left foot was performed using a chisel blade without incident or bleeding.   6.  Return to clinic in 6 weeks Edrick Kins, DPM [facility]  Dr. Edrick Kins, DPM    [address], [place] [address]                Office [phone]  Fax [phone]

## 2020-03-18 ENCOUNTER — Other Ambulatory Visit: Payer: Self-pay | Admitting: *Deleted

## 2020-03-18 DIAGNOSIS — B372 Candidiasis of skin and nail: Secondary | ICD-10-CM

## 2020-03-18 MED ORDER — NYSTATIN 100000 UNIT/GM EX CREA: 1.0000 "application " | TOPICAL_CREAM | Freq: Two times a day (BID) | CUTANEOUS | 0 refills | Status: DC

## 2020-03-18 NOTE — Telephone Encounter (Signed)
Shannon [name] called requesting refill.

## 2020-03-23 ENCOUNTER — Other Ambulatory Visit: Payer: Self-pay | Admitting: *Deleted

## 2020-03-23 NOTE — Patient Outreach (Signed)
[facility]) Care Management  [facility] Care Manager  03/23/2020   Shannon [name] 1923/06/29 [id number]  Incoming call received back from member.  State she is doing "alright" but still having problems with her foot.  Report is healing, however it is sore, using cream that was provided by podiatrist.  She will have follow up appointment on 1/24, possible further debridement.  Wears a surgical shoe for support.  Blood sugars are controlled, today's reading was 149.    Report her feeding tube is starting leaking again, waiting for her daughter to get the cream used to relieve the irritation.  Denies any urgent concerns, encouraged to contact this care manager with questions.    Encounter Medications:  Outpatient Encounter Medications as of 03/23/2020  Medication Sig  . acetaminophen (TYLENOL) 500 MG tablet Take 500-1,000 mg by mouth every 6 (six) hours as needed (pain).   . Amino Acids-Protein Hydrolys (FEEDING SUPPLEMENT, PRO-STAT SUGAR FREE 64,) LIQD Take 30 mLs by mouth 2 (two) times daily.  [name] amLODipine (NORVASC) 5 MG tablet Take 1 tablet (5 mg total) by mouth daily.  [name] atorvastatin (LIPITOR) 10 MG tablet TAKE 1 TABLET BY MOUTH EVERY DAY  . b complex vitamins tablet Take 1 tablet by mouth daily.   . benzocaine (AMERICAINE) 20 % rectal ointment Place rectally every 3 (three) hours as needed for pain.  . calcium-vitamin D (OSCAL WITH D) 500-200 MG-UNIT per tablet Take 1 tablet by mouth daily with breakfast.   . Carboxymethylcellulose Sodium (ARTIFICIAL TEARS OP) Place 1 drop into both eyes 4 (four) times daily.  . cycloSPORINE (RESTASIS) 0.05 % ophthalmic emulsion USE 1 DROP INTO BOTH EYES TWICE DAILY  . DULERA 200-5 MCG/ACT AERO TAKE TWO PUFFS BY MOUTH TWICE DAILY  . DULoxetine (CYMBALTA) 30 MG capsule TAKE 1 CAPSULE BY MOUTH EVERY DAY  . ferrous sulfate 220 (44 Fe) MG/5ML solution TAKE 7.4 MLS (325 MG TOTAL) BY MOUTH DAILY.  . fluticasone (FLONASE) 50 MCG/ACT nasal spray  SPRAY 2 SPRAYS INTO EACH NOSTRIL EVERY DAY  . furosemide (LASIX) 20 MG tablet Take 1 tablet (20 mg total) by mouth daily.  . furosemide (LASIX) 40 MG tablet Take 20-40 mg by mouth See admin instructions. Take 20 mg daily, may instead take 40 mg daily as needed for edema  . gabapentin (NEURONTIN) 250 MG/5ML solution TAKE 6 MLS (300 MG TOTAL) BY MOUTH 2 (TWO) TIMES DAILY.  [name] gentamicin cream (GARAMYCIN) 0.1 % Apply 1 application topically daily as needed (dry skin).  . hydrALAZINE (APRESOLINE) 50 MG tablet TAKE 1 TABLET BY MOUTH THREE TIMES A DAY  . insulin aspart (NOVOLOG) 100 UNIT/ML FlexPen Inject into the skin as needed for high blood sugar. New sliding Scale If BS is 151 - 251 = 2 units,  If BS is 251 -  350 = 4 units,  If BS is 351 - 450 = 6 units Call provider if BS runs more than 400  . Insulin Glargine (LANTUS SOLOSTAR) 100 UNIT/ML Solostar Pen Inject 8 Units into the skin at bedtime.  . Insulin Pen Needle (B-D UF III MINI PEN NEEDLES) 31G X 5 MM MISC Use twice daily with giving insulin injections. Dx E11.9  . ipratropium-albuterol (DUONEB) 0.5-2.5 (3) MG/3ML SOLN Take 3 mLs by nebulization every 6 (six) hours as needed (shortness of breath).  . isosorbide mononitrate (IMDUR) 30 MG 24 hr tablet TAKE 2 TABLETS BY MOUTH DAILY. PLEASE MAKE ANNUAL APPT WITH DR. Delton See FOR FUTURE REFILLS  . latanoprost (  XALATAN) 0.005 % ophthalmic solution Place 1 drop into both eyes at bedtime.  . levalbuterol (XOPENEX HFA) 45 MCG/ACT inhaler Inhale 2 puffs into the lungs every 6 (six) hours as needed for wheezing.  [name] levothyroxine (SYNTHROID) 125 MCG tablet TAKE 1 TABLET BY MOUTH EVERY DAY  . loratadine (CLARITIN) 10 MG tablet Take 10 mg by mouth daily as needed for allergies.  [name] LOTEMAX 0.5 % GEL   . Multiple Vitamin (MULTIVITAMIN WITH MINERALS) TABS tablet Take 1 tablet by mouth daily.  . Nutritional Supplements (FEEDING SUPPLEMENT, OSMOLITE 1.5 CAL,) LIQD Place 711 mLs into feeding tube See admin  instructions. Take 711 ml over a 12 hour period per day  . nystatin (MYCOSTATIN/NYSTOP) powder Apply 1 application topically as needed (skin irritation).  . nystatin cream (MYCOSTATIN) Apply 1 application topically 2 (two) times daily.  [name] oxyCODONE (ROXICODONE) 5 MG immediate release tablet Take 1 tablet (5 mg total) by mouth every 4 (four) hours as needed for severe pain. May take 1/2 tab q4hrs as needed for mild pain.  . pantoprazole (PROTONIX) 40 MG tablet TAKE 1 TABLET BY MOUTH EVERY DAY  . polyethylene glycol (MIRALAX / GLYCOLAX) 17 g packet Take 17 g by mouth as needed.  . Probiotic Product (EQL PROBIOTIC COLON SUPPORT PO) Take 1 capsule by mouth daily.  [name] senna-docusate (SENOKOT-S) 8.6-50 MG tablet Take 1 tablet by mouth as needed for mild constipation.  [name] warfarin (COUMADIN) 2.5 MG tablet TAKE 2-3 TABLETS BY MOUTH DAILY AS DIRECTED BY COUMADIN CLINIC.  [name] Water For Irrigation, Sterile (FREE WATER) SOLN Place 180 mLs into feeding tube 3 (three) times daily.   Cliffton Asters Petrolatum-Mineral Oil (SYSTANE NIGHTTIME) OINT Place 1 application into both eyes at bedtime.    No facility-administered encounter medications on file as of 03/23/2020.    Functional Status:  In your present state of health, do you have any difficulty performing the following activities: 11/14/2019 11/14/2019  Hearing? Y N  Vision? N Y  Difficulty concentrating or making decisions? N N  Walking or climbing stairs? Y N  Dressing or bathing? N N  Doing errands, shopping? N -  Preparing Food and eating ? - -  Comment - -  Using the Toilet? - -  In the past six months, have you accidently leaked urine? - -  Comment - -  Do you have problems with loss of bowel control? - -  Managing your Medications? - -  Comment - -  Managing your Finances? - -  Comment - -  Housekeeping or managing your Housekeeping? - -  Comment - -  Some recent data might be hidden    Fall/Depression Screening: Fall Risk  11/07/2019 10/21/2019  10/20/2019  Falls in the past year? 0 0 0  Number falls in past yr: 0 0 0  Injury with Fall? 0 0 -  Risk for fall due to : - - -  Follow up - - -   PHQ 2/9 Scores 10/21/2019 07/21/2019 10/10/2018 08/07/2018 10/05/2017 02/28/2017 10/25/2016  PHQ - 2 Score 0 0 0 0 0 0 -  Exception Documentation - - - - - - Other- indicate reason in comment box  Not completed - - - - - - call completed with dtr    Assessment:  Goals Addressed            This Visit's Progress   . [facility] - Make and Keep All Appointments   On track    Follow Up Date 04/20/2020  Timeframe:  Short-Term Goal Priority:  Medium Start Date:       03/23/2020                      Expected End Date:     04/22/2020                    - ask family or friend for a ride - call to cancel if needed - keep a calendar with appointment dates    Why is this important?   Part of staying healthy is seeing the doctor for follow-up care.  If you forget your appointments, there are some things you can do to stay on track.    Notes:   11/22 - Upcoming appointments reviewed with member  12/21 - reminded of follow up with podiatrist    . [facility] - Perform Foot Care   On track    Follow Up Date 04/20/2020  Timeframe:  Long-Range Goal Priority:  High Start Date:        03/23/2020                     Expected End Date:    05/23/2020                     - check feet daily for cuts, sores or redness - wash and dry feet carefully every day - wear comfortable, cotton socks - wear comfortable, well-fitting shoes    Why is this important?   Good foot care is very important when you have diabetes.  There are many things you can do to keep your feet healthy and catch a problem early.    Notes:   11/22 - Reviewed updated wound care per most recent MD visit  12/21 - Discussed ongoing wound care with member        Plan:  Follow-up:  Patient agrees to Care Plan and Follow-up. Will follow up with member within the next month.  Kemper Durie, BSN,  [facility] Care Management  [facility] Care Manager [phone]

## 2020-03-23 NOTE — Patient Outreach (Signed)
[facility]) Care Management  03/23/2020  Shannon [name] 1923/11/30 [id number]   Call placed to member to follow up on ongoing surgery recovery, no answer, unable to leave message as mailbox is full.  Call then placed to daughter, no answer, HIPAA compliant voice message left.  Will follow up within the next 3-4 business days.  [name], [place], MSN [place] [phone]

## 2020-03-24 ENCOUNTER — Other Ambulatory Visit: Payer: Self-pay | Admitting: Nurse Practitioner

## 2020-03-29 ENCOUNTER — Ambulatory Visit: Payer: Self-pay | Admitting: *Deleted

## 2020-03-30 ENCOUNTER — Ambulatory Visit (INDEPENDENT_AMBULATORY_CARE_PROVIDER_SITE_OTHER): Payer: Medicare Other

## 2020-03-30 ENCOUNTER — Other Ambulatory Visit: Payer: Self-pay

## 2020-03-30 DIAGNOSIS — Z5181 Encounter for therapeutic drug level monitoring: Secondary | ICD-10-CM | POA: Diagnosis not present

## 2020-03-30 DIAGNOSIS — I4891 Unspecified atrial fibrillation: Secondary | ICD-10-CM

## 2020-03-30 DIAGNOSIS — I4892 Unspecified atrial flutter: Secondary | ICD-10-CM

## 2020-03-30 LAB — POCT INR: INR: 2.7 (ref 2.0–3.0)

## 2020-03-30 NOTE — Patient Instructions (Signed)
Description   Continue taking warfarin 2 tablets daily except 3 tablets on Sundays and Thursdays. Recheck in 5 weeks. Call Coumadin Clinic with any new medications [phone].

## 2020-04-05 ENCOUNTER — Other Ambulatory Visit: Payer: Self-pay | Admitting: Physician Assistant

## 2020-04-05 ENCOUNTER — Other Ambulatory Visit: Payer: Self-pay | Admitting: Nurse Practitioner

## 2020-04-05 DIAGNOSIS — G5793 Unspecified mononeuropathy of bilateral lower limbs: Secondary | ICD-10-CM

## 2020-04-05 MED ORDER — GABAPENTIN 250 MG/5ML PO SOLN: 300.0000 mg | Freq: Two times a day (BID) | ORAL | 1 refills | Status: DC

## 2020-04-06 MED ORDER — FUROSEMIDE 40 MG PO TABS: 20.0000 mg | ORAL_TABLET | ORAL | 8 refills | Status: DC

## 2020-04-07 IMAGING — XA IR GI TUBE CONTRAST INJECTION
2 series · 7 of 7 positions shown · non-contrast
Comparison: Multiple previous fluoroscopic guided gastrojejunostomy
exchanges, most recently performed on 10/15/2017.

INDICATION: Leaking gastrojejunostomy tube

EXAM:
INJECTION OF GASTROJEJUNOSTOMY TUBE

[Series 2: fl - angio · 4 of 65 frames shown]
[frame 10/65]
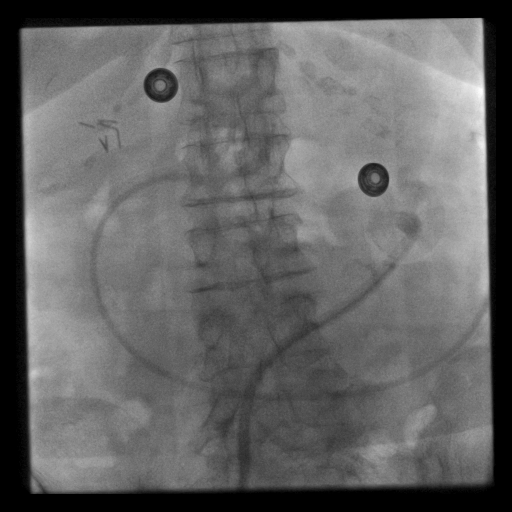
[frame 14/65]
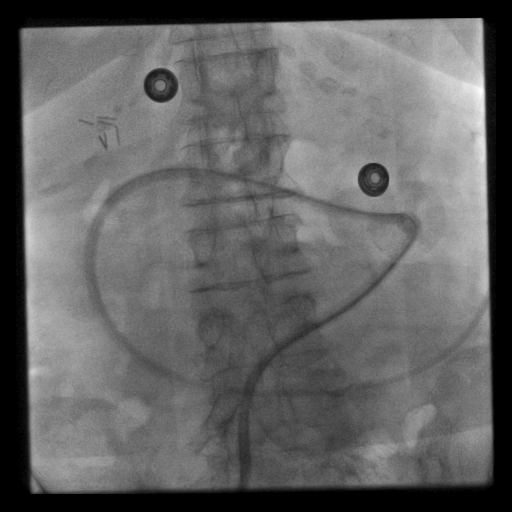
[frame 33/65]
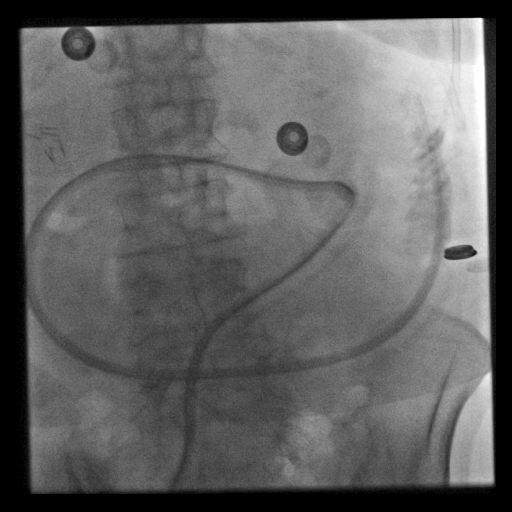
[frame 56/65]
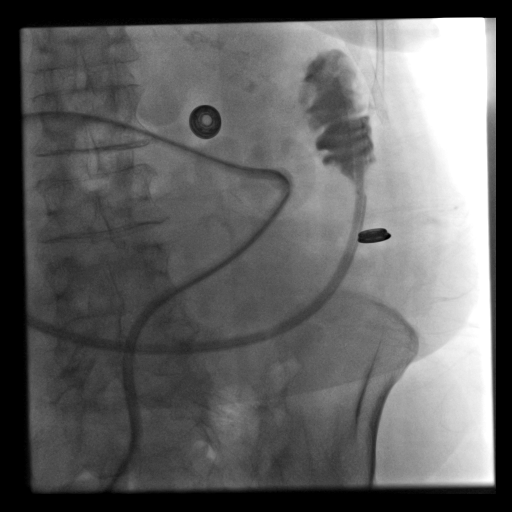

[Series 300: line placements · 3 of 3 slices shown]
[im 1/3]
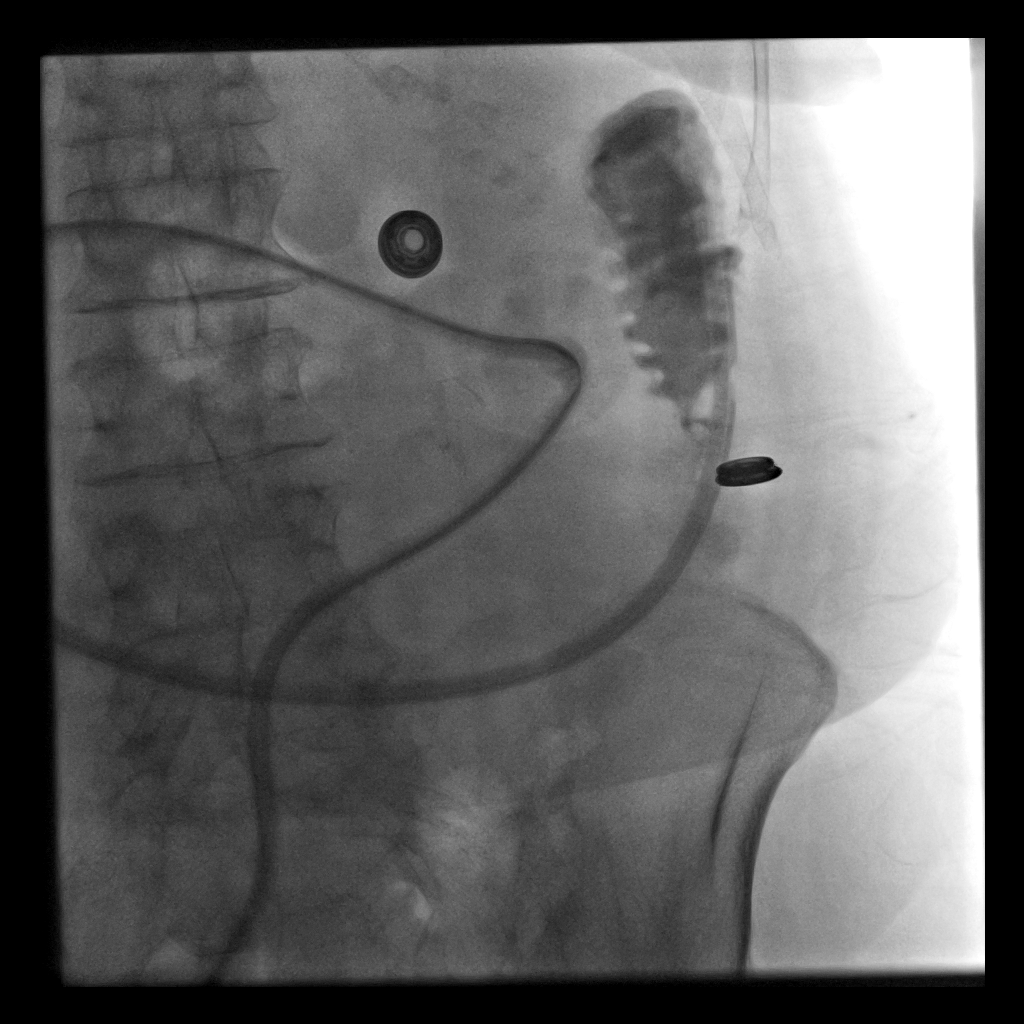
[im 2/3]
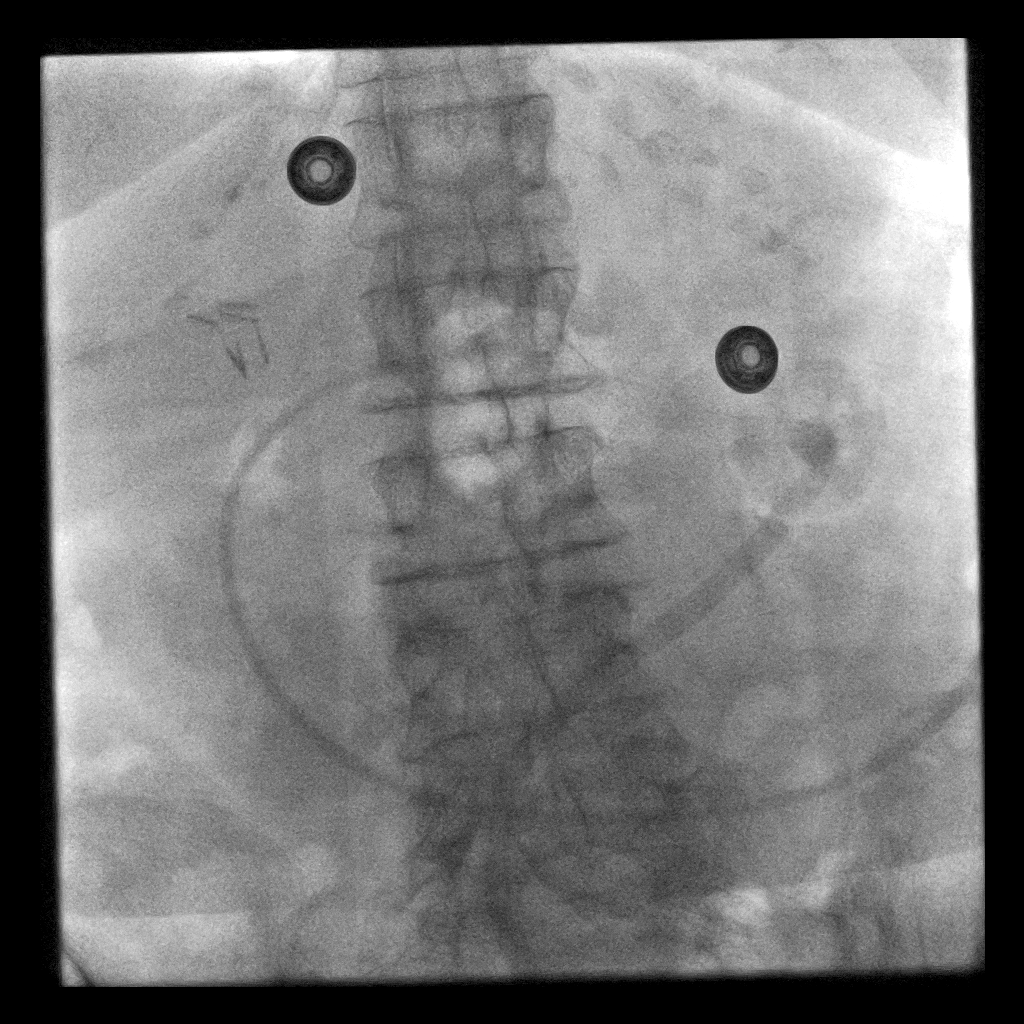
[im 3/3]
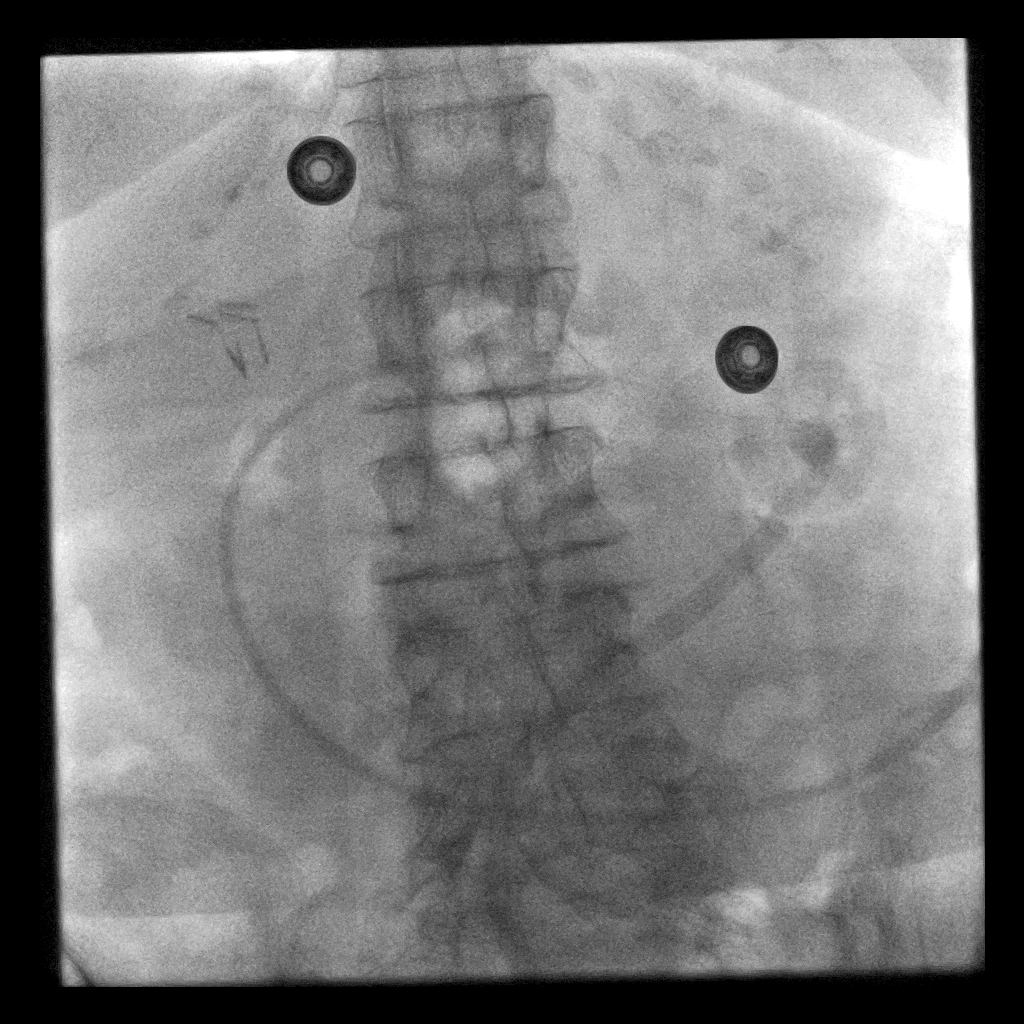

[7 of 7 positions shown; findings below may reference images not displayed]

MEDICATIONS:
None.

CONTRAST:  10mL 9BUHNQ-SZZ IOPAMIDOL (9BUHNQ-SZZ) INJECTION 61% -
administered into the proximal small bowel lumen

FLUOROSCOPY TIME:  12 seconds (2 mGy)

COMPLICATIONS:
None.

PROCEDURE:
Patient was placed supine on the fluoroscopy table.

Preprocedural spot fluoroscopic image demonstrates unchanged
positioning of the existing chronic 22 French dual lumen balloon
retention gastrostomy tube.

Contrast injection demonstrated appropriate position functionality
of the jejunal limb.

A dressing was placed. The patient tolerated procedure well without
immediate postprocedural complication.
IMPRESSION: Appropriately positioned and functioning gastrojejunostomy tube. No
exchange performed.

PLAN:
Patient's daughter was advised to attempt to limit the patient's
amount of oral intake as this could worsen the reported leakage
associated with the patient's chronic balloon retention
gastrojejunostomy catheter.

## 2020-04-08 ENCOUNTER — Other Ambulatory Visit: Payer: Self-pay | Admitting: Adult Health

## 2020-04-08 NOTE — Telephone Encounter (Signed)
Spoke with patients daughter [name]. [name] states she picked up Lasix 40 mg yesterday rx'ed by Dr.Nelson and that is the most current RX.   RX request for Lasix 20 mg denied and medication list updated by removing duplicate listing

## 2020-04-09 ENCOUNTER — Other Ambulatory Visit: Payer: Self-pay | Admitting: Nurse Practitioner

## 2020-04-09 DIAGNOSIS — F331 Major depressive disorder, recurrent, moderate: Secondary | ICD-10-CM

## 2020-04-09 DIAGNOSIS — E1142 Type 2 diabetes mellitus with diabetic polyneuropathy: Secondary | ICD-10-CM

## 2020-04-12 ENCOUNTER — Other Ambulatory Visit: Payer: Self-pay | Admitting: Nurse Practitioner

## 2020-04-12 ENCOUNTER — Other Ambulatory Visit: Payer: Self-pay | Admitting: *Deleted

## 2020-04-12 NOTE — Telephone Encounter (Signed)
Patient daughter, [name], called and stated that patient wants the Gabapentin 300mg  tablets instead of the solution.  Pended Rx and sent to [facility] for approval.

## 2020-04-12 NOTE — Telephone Encounter (Signed)
Patient daughter stated that patient will be taking the medication by mouth.

## 2020-04-12 NOTE — Telephone Encounter (Signed)
Please clarify with daughter if she will be taking this by mouth or per her feeding tube (for the instructions) if she is taking it per tube she will have to open the capsule.

## 2020-04-13 ENCOUNTER — Other Ambulatory Visit: Payer: Self-pay

## 2020-04-13 MED ORDER — GABAPENTIN 300 MG PO CAPS
600.0000 mg | ORAL_CAPSULE | Freq: Two times a day (BID) | ORAL | 0 refills | Status: DC
Start: 2020-04-13 — End: 2020-04-14

## 2020-04-14 ENCOUNTER — Telehealth: Payer: Self-pay

## 2020-04-14 MED ORDER — GABAPENTIN 300 MG PO CAPS: 300.0000 mg | ORAL_CAPSULE | Freq: Two times a day (BID) | ORAL | 1 refills | Status: DC

## 2020-04-14 NOTE — Telephone Encounter (Signed)
I called and left message for daughter, [name] to call regarding gabapentin. She called back and she stated that she is given patient 1 capsule twice a day and not going by directions that were sent into pharmacy. Given gabapentin 300 mg capsule twice a day and not gabapentin 600mg  twice a day.

## 2020-04-14 NOTE — Telephone Encounter (Signed)
Okay, I have changed the medication instructions on her list.

## 2020-04-20 ENCOUNTER — Other Ambulatory Visit: Payer: Self-pay | Admitting: *Deleted

## 2020-04-20 NOTE — Patient Outreach (Signed)
[facility]) Care Management  04/20/2020  Shannon [name] June 27, 1923 [id number]   Outgoing call placed to member, no answer, unable to leave voice message.  Will follow up within the next 3-4 business days.    Update:  Incoming call received back from member, state she remains with pain in her left foot and a small open area still healing on her right foot.  The pain is relieved with medications.  Voices concerned about the speed of wound healing, reminded of medical history: malnutrition (on tube feedings) and diabetes, both causes complications in wound healing.  She verbalizes understanding.  Follow up appointment scheduled with podiatrist on 1/24, daughter will provide transportation.  Denies any urgent concerns, encouraged to contact this care manager with questions.  Agrees to follow up within the next month.  Goals Addressed            This Visit's Progress   . [facility] - Make and Keep All Appointments   On track    Follow Up Date 04/20/2020  Timeframe:  Short-Term Goal Priority:  Medium Start Date:       03/23/2020                      Expected End Date:     04/22/2020                    - ask family or friend for a ride - call to cancel if needed - keep a calendar with appointment dates    Why is this important?   Part of staying healthy is seeing the doctor for follow-up care.  If you forget your appointments, there are some things you can do to stay on track.    Notes:   11/22 - Upcoming appointments reviewed with member  12/21 - reminded of follow up with podiatrist    . [place]   On track    Follow Up Date 04/20/2020  Timeframe:  Long-Range Goal Priority:  High Start Date:        03/23/2020                     Expected End Date:    05/23/2020                     - check feet daily for cuts, sores or redness - wash and dry feet carefully every day - wear comfortable, cotton socks - wear comfortable, well-fitting shoes    Why  is this important?   Good foot care is very important when you have diabetes.  There are many things you can do to keep your feet healthy and catch a problem early.    Notes:   11/22 - Reviewed updated wound care per most recent MD visit  12/21 - Discussed ongoing wound care with member          [name], RN, MSN [place] Manager [phone]

## 2020-04-21 ENCOUNTER — Telehealth: Payer: Self-pay | Admitting: Podiatry

## 2020-04-21 NOTE — Telephone Encounter (Signed)
Patients daughter called in stating they had been exposed to covid, expecting to have results back before Friday and will call back to either cancel or reschedule based on results

## 2020-04-22 ENCOUNTER — Other Ambulatory Visit: Payer: Self-pay

## 2020-04-22 ENCOUNTER — Emergency Department (HOSPITAL_COMMUNITY)
Admission: EM | Admit: 2020-04-22 | Discharge: 2020-04-23 | Disposition: A | Discharge status: Home / Self Care | Payer: Medicare Other | Attending: Emergency Medicine | Admitting: Emergency Medicine

## 2020-04-22 ENCOUNTER — Encounter (HOSPITAL_COMMUNITY): Payer: Self-pay

## 2020-04-22 ENCOUNTER — Emergency Department (HOSPITAL_COMMUNITY): Payer: Medicare Other

## 2020-04-22 DIAGNOSIS — I480 Paroxysmal atrial fibrillation: Secondary | ICD-10-CM | POA: Diagnosis not present

## 2020-04-22 DIAGNOSIS — Z8616 Personal history of COVID-19: Secondary | ICD-10-CM | POA: Diagnosis not present

## 2020-04-22 DIAGNOSIS — Z7901 Long term (current) use of anticoagulants: Secondary | ICD-10-CM | POA: Insufficient documentation

## 2020-04-22 DIAGNOSIS — K219 Gastro-esophageal reflux disease without esophagitis: Secondary | ICD-10-CM | POA: Insufficient documentation

## 2020-04-22 DIAGNOSIS — R109 Unspecified abdominal pain: Secondary | ICD-10-CM | POA: Diagnosis not present

## 2020-04-22 DIAGNOSIS — I10 Essential (primary) hypertension: Secondary | ICD-10-CM | POA: Diagnosis not present

## 2020-04-22 DIAGNOSIS — Z794 Long term (current) use of insulin: Secondary | ICD-10-CM | POA: Diagnosis not present

## 2020-04-22 DIAGNOSIS — N183 Chronic kidney disease, stage 3 unspecified: Secondary | ICD-10-CM | POA: Diagnosis not present

## 2020-04-22 DIAGNOSIS — J449 Chronic obstructive pulmonary disease, unspecified: Secondary | ICD-10-CM | POA: Diagnosis not present

## 2020-04-22 DIAGNOSIS — E1122 Type 2 diabetes mellitus with diabetic chronic kidney disease: Secondary | ICD-10-CM | POA: Insufficient documentation

## 2020-04-22 DIAGNOSIS — E039 Hypothyroidism, unspecified: Secondary | ICD-10-CM | POA: Diagnosis not present

## 2020-04-22 DIAGNOSIS — Z7952 Long term (current) use of systemic steroids: Secondary | ICD-10-CM | POA: Insufficient documentation

## 2020-04-22 DIAGNOSIS — I5032 Chronic diastolic (congestive) heart failure: Secondary | ICD-10-CM | POA: Diagnosis not present

## 2020-04-22 DIAGNOSIS — R1084 Generalized abdominal pain: Secondary | ICD-10-CM | POA: Diagnosis not present

## 2020-04-22 DIAGNOSIS — I130 Hypertensive heart and chronic kidney disease with heart failure and stage 1 through stage 4 chronic kidney disease, or unspecified chronic kidney disease: Secondary | ICD-10-CM | POA: Insufficient documentation

## 2020-04-22 DIAGNOSIS — R0689 Other abnormalities of breathing: Secondary | ICD-10-CM | POA: Diagnosis not present

## 2020-04-22 DIAGNOSIS — I2510 Atherosclerotic heart disease of native coronary artery without angina pectoris: Secondary | ICD-10-CM | POA: Diagnosis not present

## 2020-04-22 DIAGNOSIS — R52 Pain, unspecified: Secondary | ICD-10-CM | POA: Diagnosis not present

## 2020-04-22 DIAGNOSIS — Z79899 Other long term (current) drug therapy: Secondary | ICD-10-CM | POA: Diagnosis not present

## 2020-04-22 DIAGNOSIS — J45909 Unspecified asthma, uncomplicated: Secondary | ICD-10-CM | POA: Diagnosis not present

## 2020-04-22 DIAGNOSIS — K9423 Gastrostomy malfunction: Secondary | ICD-10-CM | POA: Diagnosis not present

## 2020-04-22 DIAGNOSIS — T85528A Displacement of other gastrointestinal prosthetic devices, implants and grafts, initial encounter: Secondary | ICD-10-CM

## 2020-04-22 LAB — CBC
HCT: 31.7 % — ABNORMAL LOW (ref 36.0–46.0)
Hemoglobin: 10.5 g/dL — ABNORMAL LOW (ref 12.0–15.0)
MCH: 31.7 pg (ref 26.0–34.0)
MCHC: 33.1 g/dL (ref 30.0–36.0)
MCV: 95.8 fL (ref 80.0–100.0)
Platelets: 183 10*3/uL (ref 150–400)
RBC: 3.31 MIL/uL — ABNORMAL LOW (ref 3.87–5.11)
RDW: 15.8 % — ABNORMAL HIGH (ref 11.5–15.5)
WBC: 5.5 10*3/uL (ref 4.0–10.5)
nRBC: 0 % (ref 0.0–0.2)

## 2020-04-22 LAB — COMPREHENSIVE METABOLIC PANEL
ALT: 19 U/L (ref 0–44)
AST: 25 U/L (ref 15–41)
Albumin: 3.5 g/dL (ref 3.5–5.0)
Alkaline Phosphatase: 79 U/L (ref 38–126)
Anion gap: 10 (ref 5–15)
BUN: 48 mg/dL — ABNORMAL HIGH (ref 8–23)
CO2: 24 mmol/L (ref 22–32)
Calcium: 9.1 mg/dL (ref 8.9–10.3)
Chloride: 102 mmol/L (ref 98–111)
Creatinine, Ser: 1.26 mg/dL — ABNORMAL HIGH (ref 0.44–1.00)
GFR, Estimated: 40 mL/min — ABNORMAL LOW (ref >60)
Glucose, Bld: 108 mg/dL — ABNORMAL HIGH (ref 70–99)
Potassium: 4.5 mmol/L (ref 3.5–5.1)
Sodium: 136 mmol/L (ref 135–145)
Total Bilirubin: 0.3 mg/dL (ref 0.3–1.2)
Total Protein: 7.5 g/dL (ref 6.5–8.1)

## 2020-04-22 LAB — URINALYSIS, ROUTINE W REFLEX MICROSCOPIC
Bilirubin Urine: NEGATIVE
Glucose, UA: NEGATIVE mg/dL
Hgb urine dipstick: NEGATIVE
Ketones, ur: NEGATIVE mg/dL
Leukocytes,Ua: NEGATIVE
Nitrite: NEGATIVE
Protein, ur: NEGATIVE mg/dL
Specific Gravity, Urine: 1.012 (ref 1.005–1.030)
pH: 8 (ref 5.0–8.0)

## 2020-04-22 LAB — LIPASE, BLOOD: Lipase: 25 U/L (ref 11–51)

## 2020-04-22 MED ORDER — MORPHINE SULFATE (PF) 2 MG/ML IV SOLN
2.0000 mg | Freq: Once | INTRAVENOUS | Status: AC
  Administered 2020-04-22: 2 mg via INTRAVENOUS
  Filled 2020-04-22: qty 1

## 2020-04-22 MED ORDER — IOHEXOL 300 MG/ML  SOLN
80.0000 mL | Freq: Once | INTRAMUSCULAR | Status: AC | PRN
  Administered 2020-04-22: 80 mL via INTRAVENOUS

## 2020-04-22 NOTE — ED Notes (Signed)
Soledad Gerlach, daughter, would like update on mom, [phone].

## 2020-04-22 NOTE — ED Triage Notes (Addendum)
Pt arrived via EMS, from home, c/o diffuse abd pain x2 days, pain with palpation. Denies any n/v or diarrhea. States she was constipated x1 day, resolved with OTC. Has G tube, no issue with that.   Given 86mcg fentanyl en route by EMS.   Caregivers at home, COVID (+)

## 2020-04-22 NOTE — Consult Note (Addendum)
Patient is known to IR service with [place] feeding tube.  Had CT today for abdominal pain and CT demonstrated the [place] retention balloon in the subcutaneous tract with mild inflammatory changes.  Tube had similar position on CT from June 2021.  I evaluated patient in ED.  Patient has family member with COVID and airborne precautions were used for this encounter. The balloon was deflated with 4 ml.  The balloon was easily exposed.  I was able to easily advance the tube back into stomach and inflated balloon with approximately 5 ml of saline.  Patient is very tender at [place] site but not clear if the retention balloon is the cause.  The retention balloon is not stuck in the subcutaneous tissues and suspect the subcutaneous tract is dilated and the balloon has a tendency to retract.  Will have to see if abdominal pain improves with the retention balloon repositioned.  Will schedule patient for outpatient drain evaluation and possible exchange.  Plan discussed with Dr. Sherwood Gambler from the Emergency Department.

## 2020-04-22 NOTE — ED Provider Notes (Signed)
Care transferred to me.  Dr. Anselm Pancoast has manipulated G-tube. As long as it works patient can be discharged with outpatient exchange. Unfortunately, it requires an adapter to flush that is not available in this ER. Waiting on this to come from [facility]. If it flushes, can get outpatient replacement. Otherwise would need to be admitted for procedure tomorrow. Care to Dr. Florina Ou.    Sherwood Gambler, MD 04/22/20 [phone]

## 2020-04-22 NOTE — ED Provider Notes (Signed)
[facility] DEPT Provider Note   CSN: [id number] Arrival date & time: 04/22/20  1135     History Chief Complaint  Patient presents with  . Abdominal Pain    Shannon [name] is a 87 y.o. female.  HPI   87 year old female past medical history of CVA, GI issues status post multiple laparotomies and chronic PEG tube, HTN, HLD presents the emergency department diffuse abdominal pain.  Patient states this started about 2 days ago.  The pain is diffuse, more severe in the upper abdomen.  PEG tube has remained in place, she states it is still functioning and she has used it without any difficulty.  She had constipation yesterday but this was relieved with over-the-counter medications.  She has had no nausea/vomiting or diarrhea.  Denies any fever.  Past Medical History:  Diagnosis Date  . Acute respiratory failure ([place])   . Anemia    previous blood transfusions  . Arthritis    "all over"  . Asthma   . Atrial flutter ([place])   . Bradycardia    requiring previous d/c of BB and reduction of amiodarone  . CAD (coronary artery disease)    nonobstructive per notes  . Chronic diastolic CHF (congestive heart failure) ([place])   . CKD (chronic kidney disease), stage III ([place])   . Complication of blood transfusion    "got the wrong blood type at [facility] in ~ 2015; no adverse reaction that we are aware of"/daughter, [name] (01/27/2016)  . COPD (chronic obstructive pulmonary disease) ([place])   . Depression    "light case"  . DVT (deep venous thrombosis) ([place]) 01/2016   a. LLE DVT 01/2016 - switched from Eliquis to Coumadin.  [name] Dyspnea    with some activity  . Gastric stenosis    a. s/p stomach tube  . GERD (gastroesophageal reflux disease)   . Headache   . History of 2019 novel coronavirus disease (COVID-19)   . History of blood transfusion    "several" (01/27/2016)  . History of stomach ulcers   . Hyperlipidemia   . Hypertension   . Hypothyroidism   . On  home oxygen therapy    "2L; 9PM til 9AM" (06/27/2017)  . PAD (peripheral artery disease) ([place])    a. prior gangrene, toe amputations, intervention.  [name] PAF (paroxysmal atrial fibrillation) ([place])   . Paraesophageal hernia   . Perforated gastric ulcer ([place])   . Pneumonia    "a few times" (06/27/2017)  . Seasonal allergies   . SIADH (syndrome of inappropriate ADH production) ([place])    [name] 01/10/2015  . Small bowel obstruction ([place])    "I don't know how many" (01/11/2015)  . Stroke [facility])    several- no residual  . Type II diabetes mellitus ([place])    "related to prednisone use  for > 20 yr; once predinose stopped; no more DM RX" (01/27/2016)  . UTI (urinary tract infection) 02/08/2016  . Ventral hernia with bowel obstruction     Patient Active Problem List   Diagnosis Date Noted  . History of transmetatarsal amputation of right foot ([place]) 11/14/2019  . Chest pain 08/27/2019  . Lab test positive for detection of COVID-19 virus 08/27/2019  . Acute encephalopathy 07/07/2019  . AMS (altered mental status) 07/07/2019  . Varicose veins of right lower extremity with ulcer other part of foot ([place]) 06/11/2019  . Jejunostomy tube fell out 03/02/2019  . S/P amputation of lesser toe, left ([place]) 02/13/2019  . BRBPR (bright  red blood per rectum) 01/10/2019  . GI bleed 12/25/2018  . Bradycardia 11/25/2018  . Amputated great toe of right foot ([place]) 05/06/2018  . Peripheral vascular disease ([place]) 05/03/2018  . Localized, primary osteoarthritis of shoulder region 01/21/2018  . PAD (peripheral artery disease) ([place]) 06/27/2017  . AKI (acute kidney injury) ([place]) 02/20/2017  . Complication of feeding tube ([place]) 02/20/2017  . PEG tube malfunction ([place])   . COPD with asthma ([place]) 10/03/2016  . Chronic deep vein thrombosis (DVT) of tibial vein of left lower extremity ([place]) 10/03/2016  . Gangrene of toe of left foot ([place]) 09/15/2016  . Recurrent aspiration pneumonia ([place])   . Atherosclerosis of native  arteries of the extremities with gangrene ([place]) 07/28/2016  . Other specified hypothyroidism   . CHF (NYHA class IV, ACC/AHA stage D) ([place])   . Shortness of breath   . Pressure injury of skin 03/11/2016  . PEG (percutaneous endoscopic gastrostomy) status ([place]) 03/10/2016  . Encounter for therapeutic drug monitoring 02/16/2016  . Warfarin anticoagulation   . Chronic seasonal allergic rhinitis   . Atrial fibrillation and flutter ([place]) 01/24/2016  . Chronic diastolic CHF (congestive heart failure) ([place]) 01/24/2016  . Bilateral leg edema 12/29/2015  . Hypothyroidism due to medication   . Weakness   . Pyloric stenosis   . Pressure ulcer 09/15/2015  . PVD (peripheral vascular disease) ([place])   . Diabetic ulcer of toe of right foot associated with type 2 diabetes mellitus, with necrosis of muscle ([place])   . Diverticulitis of large intestine without perforation or abscess without bleeding   . Colitis 08/26/2015  . Esophageal candidiasis ([place])   . SOB (shortness of breath)   . Acute renal failure superimposed on stage 4 chronic kidney disease ([place])   . Encounter for long-term (current) use of other high-risk medications   . Dysphagia 07/05/2015  . Asthma with allergic rhinitis 06/04/2015  . Hypertensive heart disease 03/18/2015  . Malnutrition of moderate degree 01/28/2015  . Syncope 01/27/2015  . Symptomatic bradycardia 01/21/2015  . Hypotension due to drugs 01/21/2015  . Protein-calorie malnutrition, severe 01/12/2015  . Hypertension associated with diabetes ([place]) 01/12/2015  . Cerebrovascular disease 07/14/2014  . Diabetic polyneuropathy associated with type 2 diabetes mellitus ([place]) 07/14/2014  . Hyperlipidemia 07/14/2014  . Gastric outlet obstruction 05/21/2014  . Unstable gait 04/29/2014  . Slurred speech   . Gastroparesis   . Constipation 02/03/2014  . Carotid stenosis 02/03/2014  . Demand ischemia ([place]) 01/21/2014  . Gastric bezoar 01/20/2014  . Anemia of chronic disease  01/18/2014  . CKD (chronic kidney disease) stage 3, GFR 30-59 ml/min 01/18/2014  . Depression 09/29/2013  . Essential hypertension, benign 09/22/2013  . GERD (gastroesophageal reflux disease) 09/22/2013  . Hyperlipidemia associated with type 2 diabetes mellitus ([place]) 09/22/2013  . Type 2 diabetes mellitus with chronic kidney disease ([place]) 09/22/2013  . Persistent atrial fibrillation 09/22/2013  . History of completed stroke 09/22/2013  . Diabetic neuropathy ([place]) 09/22/2013  . Hypothyroidism 09/22/2013  . Chronic gastrojejunal ulcer with perforation ([place]) 09/07/2013  . Accelerated hypertension 09/07/2013  . Other specified abnormal immunological findings in serum 09/07/2013  . Red blood cell antibody positive 09/07/2013    Past Surgical History:  Procedure Laterality Date  . ABDOMINAL AORTOGRAM N/A 09/21/2016   Procedure: Abdominal Aortogram;  Surgeon: Waynetta Sandy, MD;  Location: [facility];  Service: Cardiovascular;  Laterality: N/A;  . AMPUTATION Left 09/25/2016   Procedure: AMPUTATION DIGIT- LEFT 2ND AND 3RD TOES;  Surgeon:  Rosetta Posner, MD;  Location: [facility];  Service: Vascular;  Laterality: Left;  . AMPUTATION Right 05/03/2018   Procedure: AMPUTATION RIGHT GREAT TOE;  Surgeon: Rosetta Posner, MD;  Location: [facility];  Service: Vascular;  Laterality: Right;  . CATARACT EXTRACTION W/ INTRAOCULAR LENS  IMPLANT, BILATERAL    . CHOLECYSTECTOMY OPEN    . COLECTOMY    . ESOPHAGOGASTRODUODENOSCOPY N/A 01/19/2014   Procedure: ESOPHAGOGASTRODUODENOSCOPY (EGD);  Surgeon: Irene Shipper, MD;  Location[facility];  Service: Endoscopy;  Laterality: N/A;  . ESOPHAGOGASTRODUODENOSCOPY N/A 01/20/2014   Procedure: ESOPHAGOGASTRODUODENOSCOPY (EGD);  Surgeon: Irene Shipper, MD;  Location: [facility];  Service: Endoscopy;  Laterality: N/A;  . ESOPHAGOGASTRODUODENOSCOPY N/A 03/19/2014   Procedure: ESOPHAGOGASTRODUODENOSCOPY (EGD);  Surgeon: Milus Banister, MD;  Location: [facility];  Service: Endoscopy;  Laterality: N/A;  . ESOPHAGOGASTRODUODENOSCOPY N/A 07/08/2015   Procedure: ESOPHAGOGASTRODUODENOSCOPY (EGD);  Surgeon: Doran Stabler, MD;  Location: [facility] ENDOSCOPY;  Service: Endoscopy;  Laterality: N/A;  . ESOPHAGOGASTRODUODENOSCOPY (EGD) WITH PROPOFOL N/A 09/15/2015   Procedure: ESOPHAGOGASTRODUODENOSCOPY (EGD) WITH PROPOFOL;  Surgeon: Manus Gunning, MD;  [facility];  Service: Gastroenterology;  Laterality: N/A;  . GASTROJEJUNOSTOMY     hx/notes 01/10/2015  . GASTROJEJUNOSTOMY N/A 09/23/2015   Procedure: OPEN GASTROJEJUNOSTOMY TUBE PLACEMENT;  Surgeon: Arta Bruce Kinsinger, MD;  [facility]: [facility];  Service: General;  Laterality: N/A;  . GLAUCOMA SURGERY Bilateral   . HERNIA REPAIR  2015  . IR CM INJ ANY COLONIC TUBE W/FLUORO  01/05/2017  . IR CM INJ ANY COLONIC TUBE W/FLUORO  06/07/2017  . IR CM INJ ANY COLONIC TUBE W/FLUORO  11/05/2017  . IR CM INJ ANY COLONIC TUBE W/FLUORO  09/18/2018  . IR CM INJ ANY COLONIC TUBE W/FLUORO  03/06/2019  . IR CM INJ ANY COLONIC TUBE W/FLUORO  03/11/2019  . IR GENERIC HISTORICAL  01/07/2016   IR GJ TUBE CHANGE 01/07/2016 Jacqulynn Cadet, MD [facility]  . IR GENERIC HISTORICAL  01/27/2016   IR MECH REMOV OBSTRUC MAT ANY COLON TUBE W/FLUORO 01/27/2016 Markus Daft, MD [facility]  . IR GENERIC HISTORICAL  02/07/2016   IR PATIENT EVAL TECH 0-60 MINS Darrell K Allred, PA-C [facility]  . IR GENERIC HISTORICAL  02/08/2016   IR GJ TUBE CHANGE 02/08/2016 Greggory Keen, MD [facility]  . IR GENERIC HISTORICAL  01/06/2016   IR GJ TUBE CHANGE 01/06/2016 [facility] OUT REF  . IR GENERIC HISTORICAL  05/02/2016   IR CM INJ ANY COLONIC TUBE W/FLUORO 05/02/2016 Arne Cleveland, MD [facility]  . IR GENERIC HISTORICAL  05/15/2016   IR GJ TUBE CHANGE 05/15/2016 Sandi Mariscal, MD [facility]  . IR GENERIC HISTORICAL  06/28/2016   IR GJ TUBE CHANGE 06/28/2016 [facility]  . IR GJ TUBE CHANGE  02/20/2017  . IR GJ TUBE CHANGE   05/10/2017  . IR GJ TUBE CHANGE  07/06/2017  . IR GJ TUBE CHANGE  08/02/2017  . IR GJ TUBE CHANGE  10/15/2017  . IR GJ TUBE CHANGE  12/26/2017  . IR [place] TUBE CHANGE  04/08/2018  . IR [place] TUBE CHANGE  06/19/2018  . IR [place] TUBE CHANGE  03/03/2019  . IR [place] TUBE CHANGE  06/06/2019  . IR GJ TUBE CHANGE  09/26/2019  . IR GJ TUBE CHANGE  01/09/2020  . IR PATIENT EVAL TECH 0-60 MINS  10/19/2016  . IR PATIENT EVAL TECH 0-60 MINS  12/25/2016  . IR PATIENT EVAL TECH 0-60 MINS  01/29/2017  . IR PATIENT EVAL TECH 0-60 MINS  04/04/2017  . IR PATIENT EVAL TECH 0-60 MINS  04/30/2017  . IR PATIENT EVAL TECH 0-60 MINS  07/31/2017  . IR REPLC DUODEN/JEJUNO TUBE PERCUT W/FLUORO  11/14/2016  . LAPAROTOMY N/A 01/20/2015   Procedure: EXPLORATORY LAPAROTOMY;  Surgeon: Coralie Keens, MD;  Location: [facility];  Service: General;  Laterality: N/A;  . LOWER EXTREMITY ANGIOGRAPHY Left 09/21/2016   Procedure: Lower Extremity Angiography;  Surgeon: Waynetta Sandy, MD;  Location: [facility];  Service: Cardiovascular;  Laterality: Left;  . LOWER EXTREMITY ANGIOGRAPHY Right 06/27/2017   Procedure: Lower Extremity Angiography;  Surgeon: Waynetta Sandy, MD;  Location: [facility];  Service: Cardiovascular;  Laterality: Right;  . LYSIS OF ADHESION N/A 01/20/2015   Procedure: LYSIS OF ADHESIONS < 1 HOUR;  Surgeon: Coralie Keens, MD;  Location: [facility];  Service: General;  Laterality: N/A;  . PERIPHERAL VASCULAR BALLOON ANGIOPLASTY Left 09/21/2016   Procedure: Peripheral Vascular Balloon Angioplasty;  Surgeon: Waynetta Sandy, MD;  Location: [facility];  Service: Cardiovascular;  Laterality: Left;  drug coated balloon  . PERIPHERAL VASCULAR BALLOON ANGIOPLASTY Right 06/27/2017   Procedure: PERIPHERAL VASCULAR BALLOON ANGIOPLASTY;  Surgeon: Waynetta Sandy, MD;  Location: [facility];  Service: Cardiovascular;  Laterality: Right;  SFA/[id number]  . TONSILLECTOMY    . TRANSMETATARSAL  AMPUTATION Right 11/14/2019   Procedure: TRANSMETATARSAL AMPUTATION RIGHT FOOT;  Surgeon: Edrick Kins, DPM;  Location: [facility];  Service: Podiatry;  Laterality: Right;  . TUBAL LIGATION    . VENTRAL HERNIA REPAIR  2015   incarcerated ventral hernia ([facility] 09/2013)/notes 01/10/2015     OB History   No obstetric history on file.     Family History  Problem Relation Age of Onset  . Stroke Mother   . Hypertension Mother   . Diabetes Brother   . Glaucoma Son   . Glaucoma Son   . Heart attack Neg Hx   . Colon cancer Neg Hx   . Stomach cancer Neg Hx     Social History   Tobacco Use  . Smoking status: Never Smoker  . Smokeless tobacco: Former Systems developer    Types: Snuff  . Tobacco comment: "used snuff in my younger days"  Vaping Use  . Vaping Use: Never used  Substance Use Topics  . Alcohol use: Never    Alcohol/week: 0.0 standard drinks  . Drug use: Never    Home Medications Prior to Admission medications   Medication Sig Start Date End Date Taking? Authorizing Provider  acetaminophen (TYLENOL) 500 MG tablet Take 500-1,000 mg by mouth every 6 (six) hours as needed (pain).     [provider]  Amino Acids-Protein Hydrolys (FEEDING SUPPLEMENT, PRO-STAT SUGAR FREE 64,) LIQD Take 30 mLs by mouth 2 (two) times daily.    [provider]  amLODipine (NORVASC) 5 MG tablet Take 1 tablet (5 mg total) by mouth daily. 10/20/19 10/19/20  Medina-Vargas, Monina C, NP  atorvastatin (LIPITOR) 10 MG tablet TAKE 1 TABLET BY MOUTH EVERY DAY 02/23/20   [name], NP  b complex vitamins tablet Take 1 tablet by mouth daily.     [provider]  benzocaine (AMERICAINE) 20 % rectal ointment Place rectally every 3 (three) hours as needed for pain. 09/24/19   Fawze, Mina A, PA-C  calcium-vitamin D (OSCAL WITH D) 500-200 MG-UNIT per tablet Take 1 tablet by mouth daily with breakfast.  [provider]  Carboxymethylcellulose Sodium (ARTIFICIAL TEARS OP) Place 1 drop  into both eyes 4 (four) times daily.    [provider]  cycloSPORINE (RESTASIS) 0.05 % ophthalmic emulsion USE 1 DROP INTO BOTH EYES TWICE DAILY 01/29/17   Gildardo Cranker, DO  DULERA 200-5 MCG/ACT AERO TAKE TWO PUFFS BY MOUTH TWICE DAILY 03/24/20   [name], NP  DULoxetine (CYMBALTA) 30 MG capsule TAKE 1 CAPSULE BY MOUTH EVERY DAY 02/11/20   [name], NP  ferrous sulfate 220 (44 Fe) MG/5ML solution TAKE 7.4 MLS (325 MG TOTAL) BY MOUTH DAILY. 03/04/20   [name], NP  fluticasone (FLONASE) 50 MCG/ACT nasal spray SPRAY 2 SPRAYS INTO EACH NOSTRIL EVERY DAY 06/25/19   [name], NP  furosemide (LASIX) 40 MG tablet Take 0.5-1 tablets (20-40 mg total) by mouth See admin instructions. Take 20 mg daily, may instead take 40 mg daily as needed for edema 04/06/20   Dorothy Spark, MD  gabapentin (NEURONTIN) 300 MG capsule Take 1 capsule (300 mg total) by mouth 2 (two) times daily. 04/14/20   [name], NP  gentamicin cream (GARAMYCIN) 0.1 % Apply 1 application topically daily as needed (dry skin). 10/01/19   Edrick Kins, DPM  hydrALAZINE (APRESOLINE) 50 MG tablet TAKE 1 TABLET BY MOUTH THREE TIMES A DAY 08/04/19   Dorothy Spark, MD  insulin aspart (NOVOLOG) 100 UNIT/ML FlexPen Inject into the skin as needed for high blood sugar. New sliding Scale If BS is 151 - 251 = 2 units,  If BS is 251 -  350 = 4 units,  If BS is 351 - 450 = 6 units Call provider if BS runs more than 400    [provider]  Insulin Glargine (LANTUS SOLOSTAR) 100 UNIT/ML Solostar Pen Inject 8 Units into the skin at bedtime. 02/20/19   [name], NP  Insulin Pen Needle (B-D UF III MINI PEN NEEDLES) 31G X 5 MM MISC Use twice daily with giving insulin injections. Dx E11.9 07/17/18   [name], NP  ipratropium-albuterol (DUONEB) 0.5-2.5 (3) MG/3ML SOLN Take 3 mLs by nebulization every 6 (six) hours as needed (shortness of breath).    [provider]   isosorbide mononitrate (IMDUR) 30 MG 24 hr tablet TAKE 2 TABLETS BY MOUTH DAILY. PLEASE MAKE ANNUAL APPT WITH DR. Meda Coffee FOR FUTURE REFILLS 09/15/19   Dorothy Spark, MD  latanoprost (XALATAN) 0.005 % ophthalmic solution Place 1 drop into both eyes at bedtime. 06/02/14   Blanchie Serve, MD  levalbuterol [facility] HFA) 45 MCG/ACT inhaler Inhale 2 puffs into the lungs every 6 (six) hours as needed for wheezing.    [provider]  levothyroxine (SYNTHROID) 125 MCG tablet TAKE 1 TABLET BY MOUTH EVERY DAY 02/25/20   [name], NP  loratadine (CLARITIN) 10 MG tablet Take 10 mg by mouth daily as needed for allergies.    [provider]  LOTEMAX 0.5 % GEL  03/01/20   [provider]  Multiple Vitamin (MULTIVITAMIN WITH MINERALS) TABS tablet Take 1 tablet by mouth daily.    [provider]  Nutritional Supplements (FEEDING SUPPLEMENT, OSMOLITE 1.5 CAL,) LIQD Place 711 mLs into feeding tube See admin instructions. Take 711 ml over a 12 hour period per day    [provider]  nystatin (MYCOSTATIN/NYSTOP) powder Apply 1 application topically as needed (skin irritation). 09/11/19   Ngetich, Nelda Bucks, NP  nystatin cream (MYCOSTATIN) Apply 1 application  topically 2 (two) times daily. 03/18/20   [name], NP  oxyCODONE (ROXICODONE) 5 MG immediate release tablet Take 1 tablet (5 mg total) by mouth every 4 (four) hours as needed for severe pain. May take 1/2 tab q4hrs as needed for mild pain. 08/11/19   [name], NP  pantoprazole (PROTONIX) 40 MG tablet TAKE 1 TABLET BY MOUTH EVERY DAY 09/24/19   Dorothy Spark, MD  polyethylene glycol (MIRALAX / GLYCOLAX) 17 g packet Take 17 g by mouth as needed.    [provider]  Probiotic Product (EQL PROBIOTIC COLON SUPPORT PO) Take 1 capsule by mouth daily.    [provider]  senna-docusate (SENOKOT-S) 8.6-50 MG tablet Take 1 tablet by mouth as needed for mild constipation.     [provider]  warfarin (COUMADIN) 2.5 MG tablet TAKE 2-3 TABLETS BY MOUTH DAILY AS DIRECTED BY COUMADIN CLINIC. 11/19/19   Dorothy Spark, MD  Water For Irrigation, Sterile (FREE WATER) SOLN Place 180 mLs into feeding tube 3 (three) times daily.     [provider]  White Petrolatum-Mineral Oil (SYSTANE NIGHTTIME) OINT Place 1 application into both eyes at bedtime.     [provider]    Allergies    Penicillins  Review of Systems   Review of Systems  Constitutional: Negative for chills and fever.  HENT: Negative for congestion.   Eyes: Negative for visual disturbance.  Respiratory: Negative for shortness of breath.   Cardiovascular: Negative for chest pain.  Gastrointestinal: Positive for abdominal pain and constipation. Negative for blood in stool, diarrhea, nausea and vomiting.  Genitourinary: Negative for dysuria.  Skin: Negative for rash.  Neurological: Negative for headaches.    Physical Exam Updated Vital Signs BP (!) 178/63   Pulse 79   Temp 98.6 F (37 C) (Oral)   Resp 19   SpO2 99%   Physical Exam Vitals and nursing note reviewed.  Constitutional:      Appearance: Normal appearance.  HENT:     Head: Normocephalic.     Mouth/Throat:     Mouth: Mucous membranes are moist.  Cardiovascular:     Rate and Rhythm: Normal rate.  Pulmonary:     Effort: Pulmonary effort is normal. No respiratory distress.  Abdominal:     Palpations: Abdomen is soft.     Tenderness: There is generalized abdominal tenderness. There is guarding. There is no rebound.     Comments: Midline longitudinal scar with abnormal contouring of the abdomen which appears to be baseline, PEG tube is in place, PEG tube site looks appropriate  Skin:    General: Skin is warm.  Neurological:     Mental Status: She is alert and oriented to person, place, and time. Mental status is at baseline.  Psychiatric:        Mood and Affect: Mood normal.     ED Results /  Procedures / Treatments   Labs (all labs ordered are listed, but only abnormal results are displayed) Labs Reviewed  COMPREHENSIVE METABOLIC PANEL - Abnormal; Notable for the following components:      Result Value   Glucose, Bld 108 (*)    BUN 48 (*)    Creatinine, Ser 1.26 (*)    GFR, Estimated 40 (*)    All other components within normal limits  CBC - Abnormal; Notable for the following components:   RBC 3.31 (*)    Hemoglobin 10.5 (*)    HCT 31.7 (*)  RDW 15.8 (*)    All other components within normal limits  LIPASE, BLOOD  URINALYSIS, ROUTINE W REFLEX MICROSCOPIC    EKG None  Radiology No results found.  Procedures Procedures (including critical care time)  Medications Ordered in ED Medications - No data to display  ED Course  I have reviewed the triage vital signs and the nursing notes.  Pertinent labs & imaging results that were available during my care of the patient were reviewed by me and considered in my medical decision making (see chart for details).    MDM Rules/Calculators/A&P                          87 year old female presents to the emergency department for abdominal pain.  Vitals are stable, blood work is reassuring.  She has tenderness to palpation mostly around the PEG tube site.  PEG tube does flush and draw back.  CT shows displacement of the retention bulb of the gastrostomy tube in the left rectus muscle, otherwise placement the jejunal is appropriate.  They recommend IR placement.  I have called on-call IR physician Dr. Anselm Pancoast, we are pending callback to see if this procedure can be completed tonight or if she will require medical admission for replacement tomorrow.  Patient signed out to Dr. Regenia Skeeter.  Final Clinical Impression(s) / ED Diagnoses Final diagnoses:  None    Rx / DC Orders ED Discharge Orders    None       Lorelle Gibbs, DO 04/22/20 1714

## 2020-04-23 ENCOUNTER — Other Ambulatory Visit (HOSPITAL_COMMUNITY): Payer: Self-pay | Admitting: Diagnostic Radiology

## 2020-04-23 DIAGNOSIS — R1084 Generalized abdominal pain: Secondary | ICD-10-CM | POA: Diagnosis not present

## 2020-04-23 DIAGNOSIS — Z431 Encounter for attention to gastrostomy: Secondary | ICD-10-CM

## 2020-04-23 LAB — PROTIME-INR
INR: 1.9 — ABNORMAL HIGH (ref 0.8–1.2)
Prothrombin Time: 21 seconds — ABNORMAL HIGH (ref 11.4–15.2)

## 2020-04-23 MED ORDER — PANTOPRAZOLE SODIUM 40 MG PO TBEC
40.0000 mg | DELAYED_RELEASE_TABLET | Freq: Once | ORAL | Status: AC
  Administered 2020-04-23: 40 mg via ORAL
  Filled 2020-04-23: qty 1

## 2020-04-23 MED ORDER — WARFARIN SODIUM 5 MG PO TABS
5.0000 mg | ORAL_TABLET | Freq: Once | ORAL | Status: AC
  Administered 2020-04-23: 5 mg via ORAL
  Filled 2020-04-23: qty 1

## 2020-04-23 MED ORDER — ISOSORBIDE MONONITRATE ER 30 MG PO TB24
30.0000 mg | ORAL_TABLET | Freq: Every day | ORAL | Status: DC
  Filled 2020-04-23: qty 1

## 2020-04-23 MED ORDER — GABAPENTIN 300 MG PO CAPS
300.0000 mg | ORAL_CAPSULE | Freq: Once | ORAL | Status: AC
  Administered 2020-04-23: 300 mg via ORAL
  Filled 2020-04-23: qty 1

## 2020-04-23 MED ORDER — AMLODIPINE BESYLATE 5 MG PO TABS
5.0000 mg | ORAL_TABLET | Freq: Once | ORAL | Status: AC
  Administered 2020-04-23: 5 mg via ORAL
  Filled 2020-04-23: qty 1

## 2020-04-23 MED ORDER — ATORVASTATIN CALCIUM 10 MG PO TABS: 10.0000 mg | ORAL_TABLET | Freq: Every day | ORAL | Status: DC

## 2020-04-23 MED ORDER — OXYCODONE-ACETAMINOPHEN 5-325 MG PO TABS
1.0000 | ORAL_TABLET | Freq: Once | ORAL | Status: AC
  Administered 2020-04-23: 1 via ORAL
  Filled 2020-04-23: qty 1

## 2020-04-23 NOTE — ED Notes (Signed)
Per pts daughter: there is no one available to come get pt. Pts daughter to call back when she finds someone to transport pt.

## 2020-04-23 NOTE — ED Notes (Signed)
Pt assisted to [facility] and escorted out of ED to daughters vehicle. Pt and daughter updated on plan of care, dc instructions and follow up discussed. Pt and family verbalizes understanding.

## 2020-04-23 NOTE — ED Provider Notes (Addendum)
Awaiting proper device fitting to test feeding tube. In the meantime we will give the patient's usual daily medicines by mouth as she is able to swallow.  6:46 AM Proper fitting found and nursing staff was able to demonstrate the tube is flushing adequately.  I believe she is able to go home at this time   Shanon Rosser, MD 04/23/20 0244    Shanon Rosser, MD 04/23/20 [phone]

## 2020-04-24 ENCOUNTER — Other Ambulatory Visit: Payer: Self-pay | Admitting: Cardiology

## 2020-04-26 ENCOUNTER — Other Ambulatory Visit: Payer: Self-pay | Admitting: *Deleted

## 2020-04-26 ENCOUNTER — Encounter: Payer: Medicare Other | Admitting: Podiatry

## 2020-04-26 NOTE — Patient Outreach (Signed)
[facility]) Care Management  04/26/2020  CHEMEKA FILICE 1923/04/30 [id number]   Call placed to member to follow up on recent ED visit, no answer, mailbox full.  Noted member was seen for issues with feeding tube.  Issue was resolved and member discharged back home.  Will await call back, if no call back will follow up within the next 3 weeks as planned.  [name], [place], MSN [place] [phone]

## 2020-04-29 ENCOUNTER — Other Ambulatory Visit: Payer: Self-pay

## 2020-04-29 ENCOUNTER — Encounter (HOSPITAL_COMMUNITY): Payer: Self-pay

## 2020-04-29 ENCOUNTER — Emergency Department (HOSPITAL_COMMUNITY): Payer: Medicare Other

## 2020-04-29 ENCOUNTER — Inpatient Hospital Stay (HOSPITAL_COMMUNITY)
Admission: EM | Admit: 2020-04-29 | Discharge: 2020-05-02 | DRG: 683 | Disposition: A | Discharge status: Home / Self Care | Payer: Medicare Other | Attending: Internal Medicine | Admitting: Internal Medicine

## 2020-04-29 ENCOUNTER — Telehealth: Payer: Self-pay | Admitting: *Deleted

## 2020-04-29 DIAGNOSIS — E870 Hyperosmolality and hypernatremia: Secondary | ICD-10-CM | POA: Diagnosis present

## 2020-04-29 DIAGNOSIS — L89322 Pressure ulcer of left buttock, stage 2: Secondary | ICD-10-CM | POA: Diagnosis present

## 2020-04-29 DIAGNOSIS — Z431 Encounter for attention to gastrostomy: Secondary | ICD-10-CM

## 2020-04-29 DIAGNOSIS — Z9981 Dependence on supplemental oxygen: Secondary | ICD-10-CM

## 2020-04-29 DIAGNOSIS — Z79890 Hormone replacement therapy (postmenopausal): Secondary | ICD-10-CM | POA: Diagnosis not present

## 2020-04-29 DIAGNOSIS — Z79899 Other long term (current) drug therapy: Secondary | ICD-10-CM

## 2020-04-29 DIAGNOSIS — Z20822 Contact with and (suspected) exposure to covid-19: Secondary | ICD-10-CM | POA: Diagnosis present

## 2020-04-29 DIAGNOSIS — N179 Acute kidney failure, unspecified: Secondary | ICD-10-CM | POA: Diagnosis not present

## 2020-04-29 DIAGNOSIS — Z8616 Personal history of COVID-19: Secondary | ICD-10-CM | POA: Diagnosis not present

## 2020-04-29 DIAGNOSIS — I10 Essential (primary) hypertension: Secondary | ICD-10-CM | POA: Diagnosis not present

## 2020-04-29 DIAGNOSIS — R1310 Dysphagia, unspecified: Secondary | ICD-10-CM | POA: Diagnosis present

## 2020-04-29 DIAGNOSIS — E860 Dehydration: Secondary | ICD-10-CM | POA: Diagnosis not present

## 2020-04-29 DIAGNOSIS — J449 Chronic obstructive pulmonary disease, unspecified: Secondary | ICD-10-CM | POA: Diagnosis present

## 2020-04-29 DIAGNOSIS — Z89411 Acquired absence of right great toe: Secondary | ICD-10-CM

## 2020-04-29 DIAGNOSIS — E1122 Type 2 diabetes mellitus with diabetic chronic kidney disease: Secondary | ICD-10-CM | POA: Diagnosis present

## 2020-04-29 DIAGNOSIS — N183 Chronic kidney disease, stage 3 unspecified: Secondary | ICD-10-CM | POA: Diagnosis present

## 2020-04-29 DIAGNOSIS — I5032 Chronic diastolic (congestive) heart failure: Secondary | ICD-10-CM | POA: Diagnosis present

## 2020-04-29 DIAGNOSIS — I4892 Unspecified atrial flutter: Secondary | ICD-10-CM | POA: Diagnosis present

## 2020-04-29 DIAGNOSIS — E039 Hypothyroidism, unspecified: Secondary | ICD-10-CM | POA: Diagnosis present

## 2020-04-29 DIAGNOSIS — Z7901 Long term (current) use of anticoagulants: Secondary | ICD-10-CM | POA: Diagnosis not present

## 2020-04-29 DIAGNOSIS — I4891 Unspecified atrial fibrillation: Secondary | ICD-10-CM | POA: Diagnosis present

## 2020-04-29 DIAGNOSIS — Z89422 Acquired absence of other left toe(s): Secondary | ICD-10-CM

## 2020-04-29 DIAGNOSIS — Z66 Do not resuscitate: Secondary | ICD-10-CM | POA: Diagnosis present

## 2020-04-29 DIAGNOSIS — Z7951 Long term (current) use of inhaled steroids: Secondary | ICD-10-CM | POA: Diagnosis not present

## 2020-04-29 DIAGNOSIS — Z833 Family history of diabetes mellitus: Secondary | ICD-10-CM

## 2020-04-29 DIAGNOSIS — E440 Moderate protein-calorie malnutrition: Secondary | ICD-10-CM | POA: Diagnosis present

## 2020-04-29 DIAGNOSIS — K9423 Gastrostomy malfunction: Secondary | ICD-10-CM | POA: Diagnosis present

## 2020-04-29 DIAGNOSIS — J4489 Other specified chronic obstructive pulmonary disease: Secondary | ICD-10-CM | POA: Diagnosis present

## 2020-04-29 DIAGNOSIS — Z880 Allergy status to penicillin: Secondary | ICD-10-CM | POA: Diagnosis not present

## 2020-04-29 DIAGNOSIS — R1084 Generalized abdominal pain: Secondary | ICD-10-CM | POA: Diagnosis not present

## 2020-04-29 DIAGNOSIS — E1151 Type 2 diabetes mellitus with diabetic peripheral angiopathy without gangrene: Secondary | ICD-10-CM | POA: Diagnosis present

## 2020-04-29 DIAGNOSIS — I130 Hypertensive heart and chronic kidney disease with heart failure and stage 1 through stage 4 chronic kidney disease, or unspecified chronic kidney disease: Secondary | ICD-10-CM | POA: Diagnosis present

## 2020-04-29 DIAGNOSIS — Z86718 Personal history of other venous thrombosis and embolism: Secondary | ICD-10-CM

## 2020-04-29 DIAGNOSIS — R531 Weakness: Secondary | ICD-10-CM | POA: Diagnosis not present

## 2020-04-29 DIAGNOSIS — K219 Gastro-esophageal reflux disease without esophagitis: Secondary | ICD-10-CM | POA: Diagnosis present

## 2020-04-29 DIAGNOSIS — I480 Paroxysmal atrial fibrillation: Secondary | ICD-10-CM | POA: Diagnosis present

## 2020-04-29 DIAGNOSIS — Z4682 Encounter for fitting and adjustment of non-vascular catheter: Secondary | ICD-10-CM | POA: Diagnosis not present

## 2020-04-29 DIAGNOSIS — Z6821 Body mass index (BMI) 21.0-21.9, adult: Secondary | ICD-10-CM

## 2020-04-29 DIAGNOSIS — Z794 Long term (current) use of insulin: Secondary | ICD-10-CM

## 2020-04-29 DIAGNOSIS — E785 Hyperlipidemia, unspecified: Secondary | ICD-10-CM | POA: Diagnosis present

## 2020-04-29 DIAGNOSIS — Z8249 Family history of ischemic heart disease and other diseases of the circulatory system: Secondary | ICD-10-CM

## 2020-04-29 DIAGNOSIS — K9413 Enterostomy malfunction: Secondary | ICD-10-CM | POA: Diagnosis not present

## 2020-04-29 DIAGNOSIS — Z823 Family history of stroke: Secondary | ICD-10-CM

## 2020-04-29 DIAGNOSIS — I2510 Atherosclerotic heart disease of native coronary artery without angina pectoris: Secondary | ICD-10-CM | POA: Diagnosis present

## 2020-04-29 DIAGNOSIS — R627 Adult failure to thrive: Secondary | ICD-10-CM | POA: Diagnosis present

## 2020-04-29 DIAGNOSIS — I5084 End stage heart failure: Secondary | ICD-10-CM | POA: Diagnosis present

## 2020-04-29 DIAGNOSIS — Z8673 Personal history of transient ischemic attack (TIA), and cerebral infarction without residual deficits: Secondary | ICD-10-CM

## 2020-04-29 LAB — CBC
HCT: 32.8 % — ABNORMAL LOW (ref 36.0–46.0)
Hemoglobin: 11.1 g/dL — ABNORMAL LOW (ref 12.0–15.0)
MCH: 32.2 pg (ref 26.0–34.0)
MCHC: 33.8 g/dL (ref 30.0–36.0)
MCV: 95.1 fL (ref 80.0–100.0)
Platelets: 296 10*3/uL (ref 150–400)
RBC: 3.45 MIL/uL — ABNORMAL LOW (ref 3.87–5.11)
RDW: 15.3 % (ref 11.5–15.5)
WBC: 5.9 10*3/uL (ref 4.0–10.5)
nRBC: 0 % (ref 0.0–0.2)

## 2020-04-29 LAB — COMPREHENSIVE METABOLIC PANEL
ALT: 24 U/L (ref 0–44)
AST: 34 U/L (ref 15–41)
Albumin: 3.9 g/dL (ref 3.5–5.0)
Alkaline Phosphatase: 77 U/L (ref 38–126)
Anion gap: 15 (ref 5–15)
BUN: 74 mg/dL — ABNORMAL HIGH (ref 8–23)
CO2: 23 mmol/L (ref 22–32)
Calcium: 9.9 mg/dL (ref 8.9–10.3)
Chloride: 108 mmol/L (ref 98–111)
Creatinine, Ser: 2.31 mg/dL — ABNORMAL HIGH (ref 0.44–1.00)
GFR, Estimated: 19 mL/min — ABNORMAL LOW (ref >60)
Glucose, Bld: 119 mg/dL — ABNORMAL HIGH (ref 70–99)
Potassium: 3.9 mmol/L (ref 3.5–5.1)
Sodium: 146 mmol/L — ABNORMAL HIGH (ref 135–145)
Total Bilirubin: 1.1 mg/dL (ref 0.3–1.2)
Total Protein: 8.5 g/dL — ABNORMAL HIGH (ref 6.5–8.1)

## 2020-04-29 LAB — URINALYSIS, ROUTINE W REFLEX MICROSCOPIC
Bacteria, UA: NONE SEEN
Bilirubin Urine: NEGATIVE
Glucose, UA: NEGATIVE mg/dL
Hgb urine dipstick: NEGATIVE
Ketones, ur: NEGATIVE mg/dL
Nitrite: NEGATIVE
Protein, ur: NEGATIVE mg/dL
Specific Gravity, Urine: 1.012 (ref 1.005–1.030)
pH: 6 (ref 5.0–8.0)

## 2020-04-29 MED ORDER — IOHEXOL 300 MG/ML  SOLN
30.0000 mL | Freq: Once | INTRAMUSCULAR | Status: AC | PRN
  Administered 2020-04-29: 30 mL

## 2020-04-29 MED ORDER — SODIUM CHLORIDE 0.9 % IV BOLUS
1000.0000 mL | Freq: Once | INTRAVENOUS | Status: AC
  Administered 2020-04-29: 1000 mL via INTRAVENOUS

## 2020-04-29 NOTE — ED Notes (Signed)
Patient transported to X-ray 

## 2020-04-29 NOTE — H&P (Signed)
History and Physical   Shannon [name]:[id number] DOB: 03-03-1924 DOA: 04/29/2020  Referring MD/NP/PA: Dr. Cathren Laine  PCP: Sharon Seller, NP   Outpatient Specialists: None  Patient coming from: Home  Chief Complaint: Weakness and dehydration  HPI: Shannon [name] is a 87 y.o. female with medical history significant of severe dysphagia with a G-tube in place now dislodged, A. fib, hypertension, asthma, COPD, diabetes, hypertension hyperlipidemia who has not had anything diet wise since Thursday of last week.  She was in the ER at the time apparently was home afterwards and had a G-tube was dislodged.  It fell off completely.  They called in IR and were told to reinsert the tube which they did.  Following the reinsertion daughter said her nutritional supply was not delivered.  They were also on Ensure if the tube was in the right place.  For that reason patient did not have any food or drink for a week.  She came in with significant dehydration, AKI, weakness.  There was reported nausea but no significant vomiting.  No fever no chills.  Patient being admitted now for failure to thrive, AKI and significant dehydration..  ED Course: Temperature 97.4 blood pressure 114/72 pulse 92 respirate 16 oxygen sat 97% on room air.  Sodium 146, glucose 119.  Creatinine 2.31 with a baseline 1.26.  BUN 74.  CBC showed hemoglobin 11.1.  INR 1.0.  Urinalysis so far negative.  Review of Systems: As per HPI otherwise 10 point review of systems negative.    Past Medical History:  Diagnosis Date  . Acute respiratory failure (HCC)   . Anemia    previous blood transfusions  . Arthritis    "all over"  . Asthma   . Atrial flutter (HCC)   . Bradycardia    requiring previous d/c of BB and reduction of amiodarone  . CAD (coronary artery disease)    nonobstructive per notes  . Chronic diastolic CHF (congestive heart failure) (HCC)   . CKD (chronic kidney disease), stage III (HCC)   . Complication of  blood transfusion    "got the wrong blood type at [facility] in ~ 2015; no adverse reaction that we are aware of"/daughter, Shannon [name] (01/27/2016)  . COPD (chronic obstructive pulmonary disease) (HCC)   . Depression    "light case"  . DVT (deep venous thrombosis) (HCC) 01/2016   a. LLE DVT 01/2016 - switched from Eliquis to Coumadin.  [name] Dyspnea    with some activity  . Gastric stenosis    a. s/p stomach tube  . GERD (gastroesophageal reflux disease)   . Headache   . History of 2019 novel coronavirus disease (COVID-19)   . History of blood transfusion    "several" (01/27/2016)  . History of stomach ulcers   . Hyperlipidemia   . Hypertension   . Hypothyroidism   . On home oxygen therapy    "2L; 9PM til 9AM" (06/27/2017)  . PAD (peripheral artery disease) (HCC)    a. prior gangrene, toe amputations, intervention.  [name] PAF (paroxysmal atrial fibrillation) (HCC)   . Paraesophageal hernia   . Perforated gastric ulcer (HCC)   . Pneumonia    "a few times" (06/27/2017)  . Seasonal allergies   . SIADH (syndrome of inappropriate ADH production) (HCC)    [name] 01/10/2015  . Small bowel obstruction (HCC)    "I don't know how many" (01/11/2015)  . Stroke [facility])    several- no residual  . Type II diabetes  mellitus (HCC)    "related to prednisone use  for > 20 yr; once predinose stopped; no more DM RX" (01/27/2016)  . UTI (urinary tract infection) 02/08/2016  . Ventral hernia with bowel obstruction     Past Surgical History:  Procedure Laterality Date  . ABDOMINAL AORTOGRAM N/A 09/21/2016   Procedure: Abdominal Aortogram;  Surgeon: Maeola Harman, MD;  Location: [facility] INVASIVE CV LAB;  Service: Cardiovascular;  Laterality: N/A;  . AMPUTATION Left 09/25/2016   Procedure: AMPUTATION DIGIT- LEFT 2ND AND 3RD TOES;  Surgeon: Larina Earthly, MD;  Location: [facility] OR;  Service: Vascular;  Laterality: Left;  . AMPUTATION Right 05/03/2018   Procedure: AMPUTATION RIGHT GREAT TOE;  Surgeon: Larina Earthly, MD;  Location: [facility] OR;  Service: Vascular;  Laterality: Right;  . CATARACT EXTRACTION W/ INTRAOCULAR LENS  IMPLANT, BILATERAL    . CHOLECYSTECTOMY OPEN    . COLECTOMY    . ESOPHAGOGASTRODUODENOSCOPY N/A 01/19/2014   Procedure: ESOPHAGOGASTRODUODENOSCOPY (EGD);  Surgeon: Hilarie Fredrickson, MD;  Location: [facility] ENDOSCOPY;  Service: Endoscopy;  Laterality: N/A;  . ESOPHAGOGASTRODUODENOSCOPY N/A 01/20/2014   Procedure: ESOPHAGOGASTRODUODENOSCOPY (EGD);  Surgeon: Hilarie Fredrickson, MD;  Location: [facility] ENDOSCOPY;  Service: Endoscopy;  Laterality: N/A;  . ESOPHAGOGASTRODUODENOSCOPY N/A 03/19/2014   Procedure: ESOPHAGOGASTRODUODENOSCOPY (EGD);  Surgeon: Rachael Fee, MD;  Location: [facility] ENDOSCOPY;  Service: Endoscopy;  Laterality: N/A;  . ESOPHAGOGASTRODUODENOSCOPY N/A 07/08/2015   Procedure: ESOPHAGOGASTRODUODENOSCOPY (EGD);  Surgeon: Sherrilyn Rist, MD;  Location: [facility] ENDOSCOPY;  Service: Endoscopy;  Laterality: N/A;  . ESOPHAGOGASTRODUODENOSCOPY (EGD) WITH PROPOFOL N/A 09/15/2015   Procedure: ESOPHAGOGASTRODUODENOSCOPY (EGD) WITH PROPOFOL;  Surgeon: Ruffin Frederick, MD;  Location: [facility] ENDOSCOPY;  Service: Gastroenterology;  Laterality: N/A;  . GASTROJEJUNOSTOMY     hx/notes 01/10/2015  . GASTROJEJUNOSTOMY N/A 09/23/2015   Procedure: OPEN GASTROJEJUNOSTOMY TUBE PLACEMENT;  Surgeon: De Blanch Kinsinger, MD;  Location: [facility] ORS;  Service: General;  Laterality: N/A;  . GLAUCOMA SURGERY Bilateral   . HERNIA REPAIR  2015  . IR CM INJ ANY COLONIC TUBE W/FLUORO  01/05/2017  . IR CM INJ ANY COLONIC TUBE W/FLUORO  06/07/2017  . IR CM INJ ANY COLONIC TUBE W/FLUORO  11/05/2017  . IR CM INJ ANY COLONIC TUBE W/FLUORO  09/18/2018  . IR CM INJ ANY COLONIC TUBE W/FLUORO  03/06/2019  . IR CM INJ ANY COLONIC TUBE W/FLUORO  03/11/2019  . IR GENERIC HISTORICAL  01/07/2016   IR GJ TUBE CHANGE 01/07/2016 Malachy Moan, MD [facility]INTER[facility]  . IR GENERIC HISTORICAL  01/27/2016   IR MECH REMOV OBSTRUC MAT ANY COLON TUBE W/FLUORO  01/27/2016 Richarda Overlie, MD [facility]  . IR GENERIC HISTORICAL  02/07/2016   IR PATIENT EVAL TECH 0-60 MINS Darrell K Allred, PA-C [facility]  . IR GENERIC HISTORICAL  02/08/2016   IR GJ TUBE CHANGE 02/08/2016 Berdine Dance, MD [facility]  . IR GENERIC HISTORICAL  01/06/2016   IR GJ TUBE CHANGE 01/06/2016 [facility]-RAD OUT REF  . IR GENERIC HISTORICAL  05/02/2016   IR CM INJ ANY COLONIC TUBE W/FLUORO 05/02/2016 Oley Balm, MD [facility]  . IR GENERIC HISTORICAL  05/15/2016   IR GJ TUBE CHANGE 05/15/2016 Simonne Come, MD [facility]  . IR GENERIC HISTORICAL  06/28/2016   IR GJ TUBE CHANGE 06/28/2016 [facility]  . IR GJ TUBE CHANGE  02/20/2017  . IR GJ TUBE CHANGE  05/10/2017  . IR GJ TUBE CHANGE  07/06/2017  . IR GJ TUBE CHANGE  08/02/2017  .  IR GJ TUBE CHANGE  10/15/2017  . IR GJ TUBE CHANGE  12/26/2017  . IR GJ TUBE CHANGE  04/08/2018  . IR GJ TUBE CHANGE  06/19/2018  . IR GJ TUBE CHANGE  03/03/2019  . IR GJ TUBE CHANGE  06/06/2019  . IR GJ TUBE CHANGE  09/26/2019  . IR GJ TUBE CHANGE  01/09/2020  . IR PATIENT EVAL TECH 0-60 MINS  10/19/2016  . IR PATIENT EVAL TECH 0-60 MINS  12/25/2016  . IR PATIENT EVAL TECH 0-60 MINS  01/29/2017  . IR PATIENT EVAL TECH 0-60 MINS  04/04/2017  . IR PATIENT EVAL TECH 0-60 MINS  04/30/2017  . IR PATIENT EVAL TECH 0-60 MINS  07/31/2017  . IR REPLC DUODEN/JEJUNO TUBE PERCUT W/FLUORO  11/14/2016  . LAPAROTOMY N/A 01/20/2015   Procedure: EXPLORATORY LAPAROTOMY;  Surgeon: Abigail Miyamoto, MD;  Location: [facility] OR;  Service: General;  Laterality: N/A;  . LOWER EXTREMITY ANGIOGRAPHY Left 09/21/2016   Procedure: Lower Extremity Angiography;  Surgeon: Maeola Harman, MD;  Location: [facility] INVASIVE CV LAB;  Service: Cardiovascular;  Laterality: Left;  . LOWER EXTREMITY ANGIOGRAPHY Right 06/27/2017   Procedure: Lower Extremity Angiography;  Surgeon: Maeola Harman, MD;  Location: [facility] INVASIVE CV LAB;  Service: Cardiovascular;  Laterality: Right;  . LYSIS OF ADHESION  N/A 01/20/2015   Procedure: LYSIS OF ADHESIONS < 1 HOUR;  Surgeon: Abigail Miyamoto, MD;  Location: [facility] OR;  Service: General;  Laterality: N/A;  . PERIPHERAL VASCULAR BALLOON ANGIOPLASTY Left 09/21/2016   Procedure: Peripheral Vascular Balloon Angioplasty;  Surgeon: Maeola Harman, MD;  Location: [facility] INVASIVE CV LAB;  Service: Cardiovascular;  Laterality: Left;  drug coated balloon  . PERIPHERAL VASCULAR BALLOON ANGIOPLASTY Right 06/27/2017   Procedure: PERIPHERAL VASCULAR BALLOON ANGIOPLASTY;  Surgeon: Maeola Harman, MD;  Location: [facility] INVASIVE CV LAB;  Service: Cardiovascular;  Laterality: Right;  SFA/TPTRUNK  . TONSILLECTOMY    . TRANSMETATARSAL AMPUTATION Right 11/14/2019   Procedure: TRANSMETATARSAL AMPUTATION RIGHT FOOT;  Surgeon: Felecia Shelling, DPM;  Location: [facility] OR;  Service: Podiatry;  Laterality: Right;  . TUBAL LIGATION    . VENTRAL HERNIA REPAIR  2015   incarcerated ventral hernia ([facility] 09/2013)/notes 01/10/2015     reports that she has never smoked. She quit smokeless tobacco use about 40 years ago.  Her smokeless tobacco use included snuff. She reports that she does not drink alcohol and does not use drugs.  Allergies  Allergen Reactions  . Penicillins Itching, Rash and Other (See Comments)    Tolerated amoxicillin, unasyn, zosyn & cephalosporins in the past. Did it involve swelling of the face/tongue/throat, SOB, or low BP? No Did it involve sudden or severe rash/hives, skin peeling, or any reaction on the inside of your mouth or nose? No Did you need to seek medical attention at a hospital or doctor's office? Unknown When did it last happen?5+ years If all above answers are "NO", may proceed with cephalosporin use.     Family History  Problem Relation Age of Onset  . Stroke Mother   . Hypertension Mother   . Diabetes Brother   . Glaucoma Son   . Glaucoma Son   . Heart attack Neg Hx   . Colon cancer Neg Hx   . Stomach cancer Neg Hx       Prior to Admission medications   Medication Sig Start Date End Date Taking? Authorizing Provider  amLODipine (NORVASC) 5 MG tablet Take 1 tablet (5 mg total) by mouth daily.  10/20/19 10/19/20 Yes Medina-Vargas, Monina C, NP  atorvastatin (LIPITOR) 10 MG tablet TAKE 1 TABLET BY MOUTH EVERY DAY Patient taking differently: Take 10 mg by mouth daily. 02/23/20  Yes Sharon Seller, NP  b complex vitamins tablet Take 1 tablet by mouth daily.    Yes [provider]  calcium-vitamin D (OSCAL WITH D) 500-200 MG-UNIT per tablet Take 1 tablet by mouth daily with breakfast.    Yes [provider]  Carboxymethylcellulose Sodium (ARTIFICIAL TEARS OP) Place 1 drop into both eyes 4 (four) times daily.   Yes [provider]  cycloSPORINE (RESTASIS) 0.05 % ophthalmic emulsion USE 1 DROP INTO BOTH EYES TWICE DAILY Patient taking differently: Place 1 drop into both eyes 2 (two) times daily. USE 1 DROP INTO BOTH EYES TWICE DAILY 01/29/17  Yes Carter, Monica, DO  DULERA 200-5 MCG/ACT AERO TAKE TWO PUFFS BY MOUTH TWICE DAILY Patient taking differently: Inhale 2 puffs into the lungs in the morning and at bedtime. TAKE TWO PUFFS BY MOUTH TWICE DAILY 03/24/20  Yes Sharon Seller, NP  DULoxetine (CYMBALTA) 30 MG capsule TAKE 1 CAPSULE BY MOUTH EVERY DAY Patient taking differently: Take 30 mg by mouth daily. 02/11/20  Yes Sharon Seller, NP  ferrous sulfate 220 (44 Fe) MG/5ML solution TAKE 7.4 MLS (325 MG TOTAL) BY MOUTH DAILY. 03/04/20  Yes Sharon Seller, NP  fluticasone (FLONASE) 50 MCG/ACT nasal spray SPRAY 2 SPRAYS INTO EACH NOSTRIL EVERY DAY Patient taking differently: Place 2 sprays into both nostrils in the morning and at bedtime. 06/25/19  Yes Sharon Seller, NP  furosemide (LASIX) 40 MG tablet Take 0.5-1 tablets (20-40 mg total) by mouth See admin instructions. Take 20 mg daily, may instead take 40 mg daily as needed for edema 04/06/20  Yes Lars Masson, MD   gabapentin (NEURONTIN) 300 MG capsule Take 1 capsule (300 mg total) by mouth 2 (two) times daily. 04/14/20  Yes Sharon Seller, NP  hydrALAZINE (APRESOLINE) 50 MG tablet TAKE 1 TABLET BY MOUTH THREE TIMES A DAY 04/26/20  Yes Lars Masson, MD  isosorbide mononitrate (IMDUR) 30 MG 24 hr tablet TAKE 2 TABLETS BY MOUTH DAILY. PLEASE MAKE ANNUAL APPT WITH DR. Delton See FOR FUTURE REFILLS Patient taking differently: Take 60 mg by mouth daily. 09/15/19  Yes Lars Masson, MD  levothyroxine (SYNTHROID) 125 MCG tablet TAKE 1 TABLET BY MOUTH EVERY DAY Patient taking differently: Take 125 mcg by mouth daily before breakfast. 02/25/20  Yes Sharon Seller, NP  Multiple Vitamin (MULTIVITAMIN WITH MINERALS) TABS tablet Take 1 tablet by mouth daily.   Yes [provider]  Nutritional Supplements (FEEDING SUPPLEMENT, OSMOLITE 1.5 CAL,) LIQD Place 711 mLs into feeding tube See admin instructions. Take 711 ml over a 12 hour period per day   Yes [provider]  nystatin cream (MYCOSTATIN) Apply 1 application topically 2 (two) times daily. 03/18/20  Yes Sharon Seller, NP  oxyCODONE (ROXICODONE) 5 MG immediate release tablet Take 1 tablet (5 mg total) by mouth every 4 (four) hours as needed for severe pain. May take 1/2 tab q4hrs as needed for mild pain. Patient taking differently: Take 2.5-5 mg by mouth every 4 (four) hours as needed for severe pain. May take 1/2 tab q4hrs as needed for mild pain. 08/11/19  Yes Sharon Seller, NP  pantoprazole (PROTONIX) 40 MG tablet TAKE 1 TABLET BY MOUTH EVERY DAY Patient taking differently: Take 40 mg by mouth daily. 09/24/19  Yes Delton See,  Faustino Congress, MD  polyethylene glycol (MIRALAX / GLYCOLAX) 17 g packet Take 17 g by mouth daily as needed for mild constipation.   Yes [provider]  Probiotic Product (EQL PROBIOTIC COLON SUPPORT PO) Take 1 capsule by mouth daily.   Yes [provider]  warfarin (COUMADIN) 2.5 MG tablet TAKE  2-3 TABLETS BY MOUTH DAILY AS DIRECTED BY COUMADIN CLINIC. Patient taking differently: Take 5-7.5 mg by mouth See admin instructions. Take 3 tablets (7.5 mg) on (Sun & Thurs) & Take (5 mg) all other days 11/19/19  Yes Lars Masson, MD  Water For Irrigation, Sterile (FREE WATER) SOLN Place 180 mLs into feeding tube 3 (three) times daily.    Yes [provider]  acetaminophen (TYLENOL) 500 MG tablet Take 500-1,000 mg by mouth every 6 (six) hours as needed (pain).     [provider]  benzocaine (AMERICAINE) 20 % rectal ointment Place rectally every 3 (three) hours as needed for pain. 09/24/19   Luevenia Maxin, Mina A, PA-C  gentamicin cream (GARAMYCIN) 0.1 % Apply 1 application topically daily as needed (dry skin). Patient taking differently: Apply 1 application topically daily as needed (afftected areas of foot). 10/01/19   Felecia Shelling, DPM  insulin aspart (NOVOLOG) 100 UNIT/ML FlexPen Inject into the skin as needed for high blood sugar. New sliding Scale If BS is 151 - 251 = 2 units,  If BS is 251 -  350 = 4 units,  If BS is 351 - 450 = 6 units Call provider if BS runs more than 400    [provider]  Insulin Glargine (LANTUS SOLOSTAR) 100 UNIT/ML Solostar Pen Inject 8 Units into the skin at bedtime. Patient taking differently: Inject 8 Units into the skin at bedtime as needed (for glucose > 150). 02/20/19   Sharon Seller, NP  Insulin Pen Needle (B-D UF III MINI PEN NEEDLES) 31G X 5 MM MISC Use twice daily with giving insulin injections. Dx E11.9 07/17/18   Sharon Seller, NP  ipratropium-albuterol (DUONEB) 0.5-2.5 (3) MG/3ML SOLN Take 3 mLs by nebulization every 6 (six) hours as needed (shortness of breath).    [provider]  latanoprost (XALATAN) 0.005 % ophthalmic solution Place 1 drop into both eyes at bedtime. Patient not taking: Reported on 04/29/2020 06/02/14   Oneal Grout, MD  levalbuterol [facility] HFA) 45 MCG/ACT inhaler Inhale 2 puffs into the lungs  every 6 (six) hours as needed for wheezing.    [provider]  loratadine (CLARITIN) 10 MG tablet Take 10 mg by mouth daily as needed for allergies.    [provider]  nystatin (MYCOSTATIN/NYSTOP) powder Apply 1 application topically as needed (skin irritation). 09/11/19   Ngetich, Dinah C, NP  senna-docusate (SENOKOT-S) 8.6-50 MG tablet Take 1 tablet by mouth as needed for mild constipation.    [provider]  White Petrolatum-Mineral Oil (SYSTANE NIGHTTIME) OINT Place 1 application into both eyes at bedtime.     [provider]    Physical Exam: Vitals:   04/29/20 1744 04/29/20 1848 04/29/20 2302  BP: (!) 160/67 (!) 161/69 (!) 149/72  Pulse: 92 88 79  Resp: 16 16 16   Temp: 98.7 F (37.1 C) (!) 97.4 F (36.3 C)   TempSrc: Oral Oral   SpO2: 99% 97% 100%      Constitutional: Chronically ill looking, frail, no distress Vitals:   04/29/20 1744 04/29/20 1848 04/29/20 2302  BP: (!) 160/67 (!) 161/69 (!) 149/72  Pulse: 92  88 79  Resp: 16 16 16   Temp: 98.7 F (37.1 C) (!) 97.4 F (36.3 C)   TempSrc: Oral Oral   SpO2: 99% 97% 100%   Eyes: PERRL, lids and conjunctivae normal ENMT: Mucous membranes are dry. Posterior pharynx clear of any exudate or lesions.Normal dentition.  Neck: normal, supple, no masses, no thyromegaly Respiratory: clear to auscultation bilaterally, no wheezing, no crackles. Normal respiratory effort. No accessory muscle use.  Cardiovascular: Irregularly irregular rate and rhythm, no murmurs / rubs / gallops. No extremity edema. 2+ pedal pulses. No carotid bruits.  Abdomen: G-tube in place not draining, not being used no tenderness, no masses palpated. No hepatosplenomegaly. Bowel sounds positive.  Musculoskeletal: no clubbing / cyanosis. No joint deformity upper and lower extremities. Good ROM, no contractures. Normal muscle tone.  Skin: Dry and coarse skin, no rashes, lesions, ulcers. No induration Neurologic: CN 2-12 grossly  intact. Sensation intact, DTR normal. Strength 5/5 in all 4.  Psychiatric: Awake, alert oriented no distress but communicating with the daughter    Labs on Admission: I have personally reviewed following labs and imaging studies  CBC: Recent Labs  Lab 04/29/20 1827  WBC 5.9  HGB 11.1*  HCT 32.8*  MCV 95.1  PLT 296   Basic Metabolic Panel: Recent Labs  Lab 04/29/20 1827  NA 146*  K 3.9  CL 108  CO2 23  GLUCOSE 119*  BUN 74*  CREATININE 2.31*  CALCIUM 9.9   GFR: CrCl cannot be calculated (Unknown ideal weight.). Liver Function Tests: Recent Labs  Lab 04/29/20 1827  AST 34  ALT 24  ALKPHOS 77  BILITOT 1.1  PROT 8.5*  ALBUMIN 3.9   No results for input(s): LIPASE, AMYLASE in the last 168 hours. No results for input(s): AMMONIA in the last 168 hours. Coagulation Profile: Recent Labs  Lab 04/23/20 0323  INR 1.9*   Cardiac Enzymes: No results for input(s): CKTOTAL, CKMB, CKMBINDEX, TROPONINI in the last 168 hours. BNP (last 3 results) No results for input(s): PROBNP in the last 8760 hours. HbA1C: No results for input(s): HGBA1C in the last 72 hours. CBG: No results for input(s): GLUCAP in the last 168 hours. Lipid Profile: No results for input(s): CHOL, HDL, LDLCALC, TRIG, CHOLHDL, LDLDIRECT in the last 72 hours. Thyroid Function Tests: No results for input(s): TSH, T4TOTAL, FREET4, T3FREE, THYROIDAB in the last 72 hours. Anemia Panel: No results for input(s): VITAMINB12, FOLATE, FERRITIN, TIBC, IRON, RETICCTPCT in the last 72 hours. Urine analysis:    Component Value Date/Time   COLORURINE YELLOW 04/29/2020 2200   APPEARANCEUR HAZY (A) 04/29/2020 2200   LABSPEC 1.012 04/29/2020 2200   PHURINE 6.0 04/29/2020 2200   GLUCOSEU NEGATIVE 04/29/2020 2200   HGBUR NEGATIVE 04/29/2020 2200   BILIRUBINUR NEGATIVE 04/29/2020 2200   BILIRUBINUR Neg 11/01/2017 1228   KETONESUR NEGATIVE 04/29/2020 2200   PROTEINUR NEGATIVE 04/29/2020 2200   UROBILINOGEN 0.2  11/01/2017 1228   UROBILINOGEN 0.2 01/27/2015 2308   NITRITE NEGATIVE 04/29/2020 2200   LEUKOCYTESUR TRACE (A) 04/29/2020 2200   Sepsis Labs: @LABRCNTIP (procalcitonin:4,lacticidven:4) )No results found for this or any previous visit (from the past 240 hour(s)).   Radiological Exams on Admission: DG ABDOMEN PEG TUBE LOCATION  Result Date: 04/29/2020 CLINICAL DATA:  Peg tube replacement EXAM: ABDOMEN - 1 VIEW COMPARISON:  09/24/2019 FINDINGS: Supine frontal view of the abdomen and pelvis excluding the right flank was performed after the clinician injected contrast through the indwelling percutaneous gastrostomy tube. No evidence of contrast extravasation, with injected  contrast outlining the rugal folds. Bowel gas pattern is unremarkable. IMPRESSION: 1. Peg tube within the gastric lumen. No evidence of contrast extravasation. Electronically Signed   By: Sharlet Salina M.D.   On: 04/29/2020 19:56      Assessment/Plan Principal Problem:   AKI (acute kidney injury) (HCC) Active Problems:   Accelerated hypertension   Malnutrition of moderate degree   Dysphagia   Weakness   Atrial fibrillation and flutter (HCC)   CHF (NYHA class IV, ACC/AHA stage D) (HCC)   COPD with asthma (HCC)     #1 AKI: We will hydrate the patient.  Have had G-tube reinserted by IR tomorrow.  Resume feeding with IV fluids.  Also fluid boluses.  #2 severe dysphagia: Again G-tube to be reinserted by IR and monitor platelets as necessary.  #3 severe malnutrition: Resume high protein diet.  #4 A. fib with a flutter: Has been on warfarin.  We will hold warfarin in lieu of possible IR intervention.  #5 COPD with asthma: Continue empiric breathing treatments.  #6 history of CHF: Currently dehydrated.  Will hydrate gently.  #7 generalized weakness: Secondary to acute medical illness.  DVT prophylaxis: SCD Code Status: DNR Family Communication: Daughter at bedside Disposition Plan: Home Consults called:  Interventional radiology Admission status: Inpatient  Severity of Illness: The appropriate patient status for this patient is INPATIENT. Inpatient status is judged to be reasonable and necessary in order to provide the required intensity of service to ensure the patient's safety. The patient's presenting symptoms, physical exam findings, and initial radiographic and laboratory data in the context of their chronic comorbidities is felt to place them at high risk for further clinical deterioration. Furthermore, it is not anticipated that the patient will be medically stable for discharge from the hospital within 2 midnights of admission. The following factors support the patient status of inpatient.   " The patient's presenting symptoms include generalized weakness. " The worrisome physical exam findings include PEG tube out. " The initial radiographic and laboratory data are worrisome because of creatinine of 2.31. " The chronic co-morbidities include severe dysphagia.   * I certify that at the point of admission it is my clinical judgment that the patient will require inpatient hospital care spanning beyond 2 midnights from the point of admission due to high intensity of service, high risk for further deterioration and high frequency of surveillance required.Lonia Blood MD [facility] Pager [phone]  If 7PM-7AM, please contact night-coverage [email or web address] Password [facility]  04/29/2020, 11:06 PM

## 2020-04-29 NOTE — Telephone Encounter (Signed)
I am sorry she is having so much trouble with getting the tube placed. I would encourage hydration and nutritional orally as much as possible, making sure she is getting plenty of fluids. I would have her continue to call radiology and hopefully they will be able to place one tomorrow.

## 2020-04-29 NOTE — ED Notes (Addendum)
Patient arrived with her pads for her feeding tube soaked  Removed excess padding applied clean chuck pads to temporarily absorb her feeding tube that is leaking  Patient stated she lives with her daughter who takes care of her child that is handicapped  Warm blankets given

## 2020-04-29 NOTE — Telephone Encounter (Signed)
LMOM to return call.

## 2020-04-29 NOTE — Telephone Encounter (Signed)
[name], Caregiver, called and stated that patient went to the ER last Thursday because of stomach pain. It was the Feeding tube and they sent her home on Friday and told her they would have to order another one and have it placed. On Monday the tube came out and she called Radiology and they told her a new tube would be in Wednesday or Thursday this week.  She called them back today and they told her that the tube was not ordered till yesterday and now it could be tomorrow or  Monday before another one is placed.  [name] is concerned because the patient has not had any nutrition since Last Thursday and is weak because of not nutrition. Stated that patient is still eating but it is not going through her body like it should.  Caregiver is wanting to know what you advise and wanted to let you know.

## 2020-04-29 NOTE — ED Triage Notes (Signed)
Per EMS- Patient is from home. Patient is suppose to have her g-rube replaced tomorrow. Patient's family wants her seen for increased weakness since she has only been drinking Ensure.

## 2020-04-29 NOTE — ED Provider Notes (Signed)
[facility] DEPT Provider Note   CSN: [id number] Arrival date & time: 04/29/20  1736     History Chief Complaint  Patient presents with  . Weakness    Shannon [name] is a 86 y.o. female.  Patient with hx chronic GJ tube, presents with report that was in ED a week ago, and that tube needing replacement by IR - scheduled for tomorrow, and that 3 days ago the tube fell out so family were instructed to place into tract to keep open until replacement procedure.  Family wanted checked as pt has not been receiving her normal feedings via tube, and is taking little by mouth - they are concerned about patients nutrition and hydration.. Patient indicates she is able to take somefood/fluids by mouth, but that uses tube for most nutrition. Denies abd pain or nausea/vomiting. Having normal bms. Denies fever or chills. No faintness or dizziness. +feels generally weak/fatigued. No syncope.   The history is provided by the patient.  Weakness Associated symptoms: no abdominal pain, no chest pain, no fever, no headaches and no shortness of breath        Past Medical History:  Diagnosis Date  . Acute respiratory failure ([place])   . Anemia    previous blood transfusions  . Arthritis    "all over"  . Asthma   . Atrial flutter ([place])   . Bradycardia    requiring previous d/c of BB and reduction of amiodarone  . CAD (coronary artery disease)    nonobstructive per notes  . Chronic diastolic CHF (congestive heart failure) ([place])   . CKD (chronic kidney disease), stage III ([place])   . Complication of blood transfusion    "got the wrong blood type at [facility] in ~ 2015; no adverse reaction that we are aware of"/daughter, [name] (01/27/2016)  . COPD (chronic obstructive pulmonary disease) ([place])   . Depression    "light case"  . DVT (deep venous thrombosis) ([place]) 01/2016   a. LLE DVT 01/2016 - switched from Eliquis to Coumadin.  [name] Dyspnea    with some activity  . Gastric  stenosis    a. s/p stomach tube  . GERD (gastroesophageal reflux disease)   . Headache   . History of 2019 novel coronavirus disease (COVID-19)   . History of blood transfusion    "several" (01/27/2016)  . History of stomach ulcers   . Hyperlipidemia   . Hypertension   . Hypothyroidism   . On home oxygen therapy    "2L; 9PM til 9AM" (06/27/2017)  . PAD (peripheral artery disease) ([place])    a. prior gangrene, toe amputations, intervention.  [name] PAF (paroxysmal atrial fibrillation) ([place])   . Paraesophageal hernia   . Perforated gastric ulcer ([place])   . Pneumonia    "a few times" (06/27/2017)  . Seasonal allergies   . SIADH (syndrome of inappropriate ADH production) ([place])    [name] 01/10/2015  . Small bowel obstruction ([place])    "I don't know how many" (01/11/2015)  . Stroke [facility])    several- no residual  . Type II diabetes mellitus ([place])    "related to prednisone use  for > 20 yr; once predinose stopped; no more DM RX" (01/27/2016)  . UTI (urinary tract infection) 02/08/2016  . Ventral hernia with bowel obstruction     Patient Active Problem List   Diagnosis Date Noted  . History of transmetatarsal amputation of right foot ([place]) 11/14/2019  . Chest pain 08/27/2019  . Lab  test positive for detection of COVID-19 virus 08/27/2019  . Acute encephalopathy 07/07/2019  . AMS (altered mental status) 07/07/2019  . Varicose veins of right lower extremity with ulcer other part of foot ([place]) 06/11/2019  . Jejunostomy tube fell out 03/02/2019  . S/P amputation of lesser toe, left ([place]) 02/13/2019  . BRBPR (bright red blood per rectum) 01/10/2019  . GI bleed 12/25/2018  . Bradycardia 11/25/2018  . Amputated great toe of right foot ([place]) 05/06/2018  . Peripheral vascular disease ([place]) 05/03/2018  . Localized, primary osteoarthritis of shoulder region 01/21/2018  . PAD (peripheral artery disease) ([place]) 06/27/2017  . AKI (acute kidney injury) ([place]) 02/20/2017  . Complication of feeding tube  ([place]) 02/20/2017  . PEG tube malfunction ([place])   . COPD with asthma ([place]) 10/03/2016  . Chronic deep vein thrombosis (DVT) of tibial vein of left lower extremity ([place]) 10/03/2016  . Gangrene of toe of left foot ([place]) 09/15/2016  . Recurrent aspiration pneumonia ([place])   . Atherosclerosis of native arteries of the extremities with gangrene ([place]) 07/28/2016  . Other specified hypothyroidism   . CHF (NYHA class IV, ACC/AHA stage D) ([place])   . Shortness of breath   . Pressure injury of skin 03/11/2016  . PEG (percutaneous endoscopic gastrostomy) status ([place]) 03/10/2016  . Encounter for therapeutic drug monitoring 02/16/2016  . Warfarin anticoagulation   . Chronic seasonal allergic rhinitis   . Atrial fibrillation and flutter ([place]) 01/24/2016  . Chronic diastolic CHF (congestive heart failure) ([place]) 01/24/2016  . Bilateral leg edema 12/29/2015  . Hypothyroidism due to medication   . Weakness   . Pyloric stenosis   . Pressure ulcer 09/15/2015  . PVD (peripheral vascular disease) ([place])   . Diabetic ulcer of toe of right foot associated with type 2 diabetes mellitus, with necrosis of muscle ([place])   . Diverticulitis of large intestine without perforation or abscess without bleeding   . Colitis 08/26/2015  . Esophageal candidiasis ([place])   . SOB (shortness of breath)   . Acute renal failure superimposed on stage 4 chronic kidney disease ([place])   . Encounter for long-term (current) use of other high-risk medications   . Dysphagia 07/05/2015  . Asthma with allergic rhinitis 06/04/2015  . Hypertensive heart disease 03/18/2015  . Malnutrition of moderate degree 01/28/2015  . Syncope 01/27/2015  . Symptomatic bradycardia 01/21/2015  . Hypotension due to drugs 01/21/2015  . Protein-calorie malnutrition, severe 01/12/2015  . Hypertension associated with diabetes ([place]) 01/12/2015  . Cerebrovascular disease 07/14/2014  . Diabetic polyneuropathy associated with type 2 diabetes mellitus ([place])  07/14/2014  . Hyperlipidemia 07/14/2014  . Gastric outlet obstruction 05/21/2014  . Unstable gait 04/29/2014  . Slurred speech   . Gastroparesis   . Constipation 02/03/2014  . Carotid stenosis 02/03/2014  . Demand ischemia ([place]) 01/21/2014  . Gastric bezoar 01/20/2014  . Anemia of chronic disease 01/18/2014  . CKD (chronic kidney disease) stage 3, GFR 30-59 ml/min 01/18/2014  . Depression 09/29/2013  . Essential hypertension, benign 09/22/2013  . GERD (gastroesophageal reflux disease) 09/22/2013  . Hyperlipidemia associated with type 2 diabetes mellitus ([place]) 09/22/2013  . Type 2 diabetes mellitus with chronic kidney disease ([place]) 09/22/2013  . Persistent atrial fibrillation 09/22/2013  . History of completed stroke 09/22/2013  . Diabetic neuropathy ([place]) 09/22/2013  . Hypothyroidism 09/22/2013  . Chronic gastrojejunal ulcer with perforation ([place]) 09/07/2013  . Accelerated hypertension 09/07/2013  . Other specified abnormal immunological findings in serum 09/07/2013  . Red blood cell antibody positive  09/07/2013    Past Surgical History:  Procedure Laterality Date  . ABDOMINAL AORTOGRAM N/A 09/21/2016   Procedure: Abdominal Aortogram;  Surgeon: Waynetta Sandy, MD;  Location: [facility];  Service: Cardiovascular;  Laterality: N/A;  . AMPUTATION Left 09/25/2016   Procedure: AMPUTATION DIGIT- LEFT 2ND AND 3RD TOES;  Surgeon: Rosetta Posner, MD;  Location: [facility];  Service: Vascular;  Laterality: Left;  . AMPUTATION Right 05/03/2018   Procedure: AMPUTATION RIGHT GREAT TOE;  Surgeon: Rosetta Posner, MD;  Location: [facility];  Service: Vascular;  Laterality: Right;  . CATARACT EXTRACTION W/ INTRAOCULAR LENS  IMPLANT, BILATERAL    . CHOLECYSTECTOMY OPEN    . COLECTOMY    . ESOPHAGOGASTRODUODENOSCOPY N/A 01/19/2014   Procedure: ESOPHAGOGASTRODUODENOSCOPY (EGD);  Surgeon: Irene Shipper, MD;  Location: [facility];  Service: Endoscopy;  Laterality: N/A;  .  ESOPHAGOGASTRODUODENOSCOPY N/A 01/20/2014   Procedure: ESOPHAGOGASTRODUODENOSCOPY (EGD);  Surgeon: Irene Shipper, MD;  Location: [facility];  Service: Endoscopy;  Laterality: N/A;  . ESOPHAGOGASTRODUODENOSCOPY N/A 03/19/2014   Procedure: ESOPHAGOGASTRODUODENOSCOPY (EGD);  Surgeon: Milus Banister, MD;  Location: [facility];  Service: Endoscopy;  Laterality: N/A;  . ESOPHAGOGASTRODUODENOSCOPY N/A 07/08/2015   Procedure: ESOPHAGOGASTRODUODENOSCOPY (EGD);  Surgeon: Doran Stabler, MD;  Location[facility] END[facility];  Service: Endoscopy;  Laterality: N/A;  . ESOPHAGOGASTRODUODENOSCOPY (EGD) WITH PROPOFOL N/A 09/15/2015   Procedure: ESOPHAGOGASTRODUODENOSCOPY (EGD) WITH PROPOFOL;  Surgeon: Manus Gunning, MD;  Location: [facility];  Service: Gastroenterology;  Laterality: N/A;  . GASTROJEJUNOSTOMY     hx/notes 01/10/2015  . GASTROJEJUNOSTOMY N/A 09/23/2015   Procedure: OPEN GASTROJEJUNOSTOMY TUBE PLACEMENT;  Surgeon: Arta Bruce Kinsinger, MD;  Location: [facility];  Service: General;  Laterality: N/A;  . GLAUCOMA SURGERY Bilateral   . HERNIA REPAIR  2015  . IR CM INJ ANY COLONIC TUBE W/FLUORO  01/05/2017  . IR CM INJ ANY COLONIC TUBE W/FLUORO  06/07/2017  . IR CM INJ ANY COLONIC TUBE W/FLUORO  11/05/2017  . IR CM INJ ANY COLONIC TUBE W/FLUORO  09/18/2018  . IR CM INJ ANY COLONIC TUBE W/FLUORO  03/06/2019  . IR CM INJ ANY COLONIC TUBE W/FLUORO  03/11/2019  . IR GENERIC HISTORICAL  01/07/2016   IR GJ TUBE CHANGE 01/07/2016 Jacqulynn Cadet, MD [facility]  . IR GENERIC HISTORICAL  01/27/2016   IR MECH REMOV OBSTRUC MAT ANY COLON TUBE W/FLUORO 01/27/2016 Markus Daft, MD [facility]  . IR GENERIC HISTORICAL  02/07/2016   IR PATIENT EVAL TECH 0-60 MINS Darrell K Allred, PA-C [facility]  . IR GENERIC HISTORICAL  02/08/2016   IR GJ TUBE CHANGE 02/08/2016 Greggory Keen, MD [facility]  . IR GENERIC HISTORICAL  01/06/2016   IR GJ TUBE CHANGE 01/06/2016 [facility] OUT REF  . IR GENERIC HISTORICAL  05/02/2016    IR CM INJ ANY COLONIC TUBE W/FLUORO 05/02/2016 Arne Cleveland, MD [facility]  . IR GENERIC HISTORICAL  05/15/2016   IR GJ TUBE CHANGE 05/15/2016 Sandi Mariscal, MD [facility]  . IR GENERIC HISTORICAL  06/28/2016   IR GJ TUBE CHANGE 06/28/2016 [facility]  . IR GJ TUBE CHANGE  02/20/2017  . IR GJ TUBE CHANGE  05/10/2017  . IR GJ TUBE CHANGE  07/06/2017  . IR GJ TUBE CHANGE  08/02/2017  . IR GJ TUBE CHANGE  10/15/2017  . IR GJ TUBE CHANGE  12/26/2017  . IR [place] TUBE CHANGE  04/08/2018  . IR [place] TUBE CHANGE  06/19/2018  . IR GJ  TUBE CHANGE  03/03/2019  . IR [place] TUBE CHANGE  06/06/2019  . IR GJ TUBE CHANGE  09/26/2019  . IR GJ TUBE CHANGE  01/09/2020  . IR PATIENT EVAL TECH 0-60 MINS  10/19/2016  . IR PATIENT EVAL TECH 0-60 MINS  12/25/2016  . IR PATIENT EVAL TECH 0-60 MINS  01/29/2017  . IR PATIENT EVAL TECH 0-60 MINS  04/04/2017  . IR PATIENT EVAL TECH 0-60 MINS  04/30/2017  . IR PATIENT EVAL TECH 0-60 MINS  07/31/2017  . IR REPLC DUODEN/JEJUNO TUBE PERCUT W/FLUORO  11/14/2016  . LAPAROTOMY N/A 01/20/2015   Procedure: EXPLORATORY LAPAROTOMY;  Surgeon: Coralie Keens, MD;  Location: [facility];  Service: General;  Laterality: N/A;  . LOWER EXTREMITY ANGIOGRAPHY Left 09/21/2016   Procedure: Lower Extremity Angiography;  Surgeon: Waynetta Sandy, MD;  Location: [facility];  Service: Cardiovascular;  Laterality: Left;  . LOWER EXTREMITY ANGIOGRAPHY Right 06/27/2017   Procedure: Lower Extremity Angiography;  Surgeon: Waynetta Sandy, MD;  Location: [facility];  Service: Cardiovascular;  Laterality: Right;  . LYSIS OF ADHESION N/A 01/20/2015   Procedure: LYSIS OF ADHESIONS < 1 HOUR;  Surgeon: Coralie Keens, MD;  Location: [facility];  Service: General;  Laterality: N/A;  . PERIPHERAL VASCULAR BALLOON ANGIOPLASTY Left 09/21/2016   Procedure: Peripheral Vascular Balloon Angioplasty;  Surgeon: Waynetta Sandy, MD;  Location: [facility];  Service: Cardiovascular;  Laterality:  Left;  drug coated balloon  . PERIPHERAL VASCULAR BALLOON ANGIOPLASTY Right 06/27/2017   Procedure: PERIPHERAL VASCULAR BALLOON ANGIOPLASTY;  Surgeon: Waynetta Sandy, MD;  Location: [facility];  Service: Cardiovascular;  Laterality: Right;  [id number]  . TONSILLECTOMY    . TRANSMETATARSAL AMPUTATION Right 11/14/2019   Procedure: TRANSMETATARSAL AMPUTATION RIGHT FOOT;  Surgeon: Edrick Kins, DPM;  Location: [facility];  Service: Podiatry;  Laterality: Right;  . TUBAL LIGATION    . VENTRAL HERNIA REPAIR  2015   incarcerated ventral hernia ([facility] 09/2013)/notes 01/10/2015     OB History   No obstetric history on file.     Family History  Problem Relation Age of Onset  . Stroke Mother   . Hypertension Mother   . Diabetes Brother   . Glaucoma Son   . Glaucoma Son   . Heart attack Neg Hx   . Colon cancer Neg Hx   . Stomach cancer Neg Hx     Social History   Tobacco Use  . Smoking status: Never Smoker  . Smokeless tobacco: Former Systems developer    Types: Snuff  . Tobacco comment: "used snuff in my younger days"  Vaping Use  . Vaping Use: Never used  Substance Use Topics  . Alcohol use: Never    Alcohol/week: 0.0 standard drinks  . Drug use: Never    Home Medications Prior to Admission medications   Medication Sig Start Date End Date Taking? Authorizing Provider  acetaminophen (TYLENOL) 500 MG tablet Take 500-1,000 mg by mouth every 6 (six) hours as needed (pain).     [provider]  amLODipine (NORVASC) 5 MG tablet Take 1 tablet (5 mg total) by mouth daily. 10/20/19 10/19/20  Medina-Vargas, Monina C, NP  atorvastatin (LIPITOR) 10 MG tablet TAKE 1 TABLET BY MOUTH EVERY DAY Patient taking differently: Take 10 mg by mouth daily. 02/23/20   [name], NP  b complex vitamins tablet Take 1 tablet by mouth daily.     [provider]  benzocaine (AMERICAINE) 20 %  rectal ointment Place rectally every 3 (three) hours as needed for pain. 09/24/19   Fawze,  Mina A, PA-C  calcium-vitamin D (OSCAL WITH D) 500-200 MG-UNIT per tablet Take 1 tablet by mouth daily with breakfast.     [provider]  Carboxymethylcellulose Sodium (ARTIFICIAL TEARS OP) Place 1 drop into both eyes 4 (four) times daily.    [provider]  cycloSPORINE (RESTASIS) 0.05 % ophthalmic emulsion USE 1 DROP INTO BOTH EYES TWICE DAILY Patient taking differently: Place 1 drop into both eyes 2 (two) times daily. USE 1 DROP INTO BOTH EYES TWICE DAILY 01/29/17   Eulas Post, Monica, DO  [place] 200-5 MCG/ACT AERO TAKE TWO PUFFS BY MOUTH TWICE DAILY Patient taking differently: Inhale 2 puffs into the lungs in the morning and at bedtime. TAKE TWO PUFFS BY MOUTH TWICE DAILY 03/24/20   [name], NP  DULoxetine (CYMBALTA) 30 MG capsule TAKE 1 CAPSULE BY MOUTH EVERY DAY Patient taking differently: Take 30 mg by mouth daily. 02/11/20   [name], NP  ferrous sulfate 220 (44 Fe) MG/5ML solution TAKE 7.4 MLS (325 MG TOTAL) BY MOUTH DAILY. 03/04/20   [name], NP  fluticasone (FLONASE) 50 MCG/ACT nasal spray SPRAY 2 SPRAYS INTO EACH NOSTRIL EVERY DAY Patient taking differently: Place 2 sprays into both nostrils daily. SPRAY 2 SPRAYS INTO EACH NOSTRIL EVERY DAY 06/25/19   [name], NP  furosemide (LASIX) 40 MG tablet Take 0.5-1 tablets (20-40 mg total) by mouth See admin instructions. Take 20 mg daily, may instead take 40 mg daily as needed for edema 04/06/20   Dorothy Spark, MD  gabapentin (NEURONTIN) 300 MG capsule Take 1 capsule (300 mg total) by mouth 2 (two) times daily. 04/14/20   [name], NP  gentamicin cream (GARAMYCIN) 0.1 % Apply 1 application topically daily as needed (dry skin). Patient taking differently: Apply 1 application topically daily as needed (afftected areas of foot). 10/01/19   Edrick Kins, DPM  hydrALAZINE (APRESOLINE) 50 MG tablet TAKE 1 TABLET BY MOUTH THREE TIMES A DAY 04/26/20   Dorothy Spark, MD  insulin  aspart (NOVOLOG) 100 UNIT/ML FlexPen Inject into the skin as needed for high blood sugar. New sliding Scale If BS is 151 - 251 = 2 units,  If BS is 251 -  350 = 4 units,  If BS is 351 - 450 = 6 units Call provider if BS runs more than 400    [provider]  Insulin Glargine (LANTUS SOLOSTAR) 100 UNIT/ML Solostar Pen Inject 8 Units into the skin at bedtime. Patient taking differently: Inject 8 Units into the skin at bedtime as needed (for glucose > 150). 02/20/19   [name], NP  Insulin Pen Needle (B-D UF III MINI PEN NEEDLES) 31G X 5 MM MISC Use twice daily with giving insulin injections. Dx E11.9 07/17/18   [name], NP  ipratropium-albuterol (DUONEB) 0.5-2.5 (3) MG/3ML SOLN Take 3 mLs by nebulization every 6 (six) hours as needed (shortness of breath).    [provider]  isosorbide mononitrate (IMDUR) 30 MG 24 hr tablet TAKE 2 TABLETS BY MOUTH DAILY. PLEASE MAKE ANNUAL APPT WITH DR. Meda Coffee FOR FUTURE REFILLS Patient taking differently: Take 60 mg by mouth daily. 09/15/19   Dorothy Spark, MD  latanoprost (XALATAN) 0.005 % ophthalmic solution Place 1 drop into both eyes at bedtime. 06/02/14   Blanchie Serve, MD  levalbuterol [facility] HFA) 45 MCG/ACT inhaler Inhale 2 puffs  into the lungs every 6 (six) hours as needed for wheezing.    [provider]  levothyroxine (SYNTHROID) 125 MCG tablet TAKE 1 TABLET BY MOUTH EVERY DAY Patient taking differently: Take 125 mcg by mouth daily before breakfast. 02/25/20   [name], NP  loratadine (CLARITIN) 10 MG tablet Take 10 mg by mouth daily as needed for allergies.    [provider]  Multiple Vitamin (MULTIVITAMIN WITH MINERALS) TABS tablet Take 1 tablet by mouth daily.    [provider]  Nutritional Supplements (FEEDING SUPPLEMENT, OSMOLITE 1.5 CAL,) LIQD Place 711 mLs into feeding tube See admin instructions. Take 711 ml over a 12 hour period per day    [provider]   nystatin (MYCOSTATIN/NYSTOP) powder Apply 1 application topically as needed (skin irritation). 09/11/19   Ngetich, Dinah C, NP  nystatin cream (MYCOSTATIN) Apply 1 application topically 2 (two) times daily. 03/18/20   [name], NP  oxyCODONE (ROXICODONE) 5 MG immediate release tablet Take 1 tablet (5 mg total) by mouth every 4 (four) hours as needed for severe pain. May take 1/2 tab q4hrs as needed for mild pain. Patient taking differently: Take 2.5-5 mg by mouth every 4 (four) hours as needed for severe pain. May take 1/2 tab q4hrs as needed for mild pain. 08/11/19   [name], NP  pantoprazole (PROTONIX) 40 MG tablet TAKE 1 TABLET BY MOUTH EVERY DAY Patient taking differently: Take 40 mg by mouth daily. 09/24/19   Dorothy Spark, MD  polyethylene glycol (MIRALAX / GLYCOLAX) 17 g packet Take 17 g by mouth daily as needed for mild constipation.    [provider]  Probiotic Product (EQL PROBIOTIC COLON SUPPORT PO) Take 1 capsule by mouth daily.    [provider]  senna-docusate (SENOKOT-S) 8.6-50 MG tablet Take 1 tablet by mouth as needed for mild constipation.    [provider]  warfarin (COUMADIN) 2.5 MG tablet TAKE 2-3 TABLETS BY MOUTH DAILY AS DIRECTED BY COUMADIN CLINIC. Patient taking differently: Take 5-7.5 mg by mouth See admin instructions. Taking 5mg  on Mon,Tues, Wed, Friday, Sat. Then 7.5mg  on Sunday and Thurs 11/19/19   Dorothy Spark, MD  Water For Irrigation, Sterile (FREE WATER) SOLN Place 180 mLs into feeding tube 3 (three) times daily.     [provider]  White Petrolatum-Mineral Oil (SYSTANE NIGHTTIME) OINT Place 1 application into both eyes at bedtime.     [provider]    Allergies    Penicillins  Review of Systems   Review of Systems  Constitutional: Negative for fever.  HENT: Negative for sore throat.   Eyes: Negative for redness.  Respiratory: Negative for shortness of breath.   Cardiovascular:  Negative for chest pain.  Gastrointestinal: Negative for abdominal pain.  Genitourinary: Negative for flank pain.  Musculoskeletal: Negative for back pain.  Skin: Negative for rash.  Neurological: Positive for weakness. Negative for headaches.  Hematological: Does not bruise/bleed easily.  Psychiatric/Behavioral: Negative for confusion.    Physical Exam Updated Vital Signs BP (!) 161/69   Pulse 88   Temp (!) 97.4 F (36.3 C) (Oral)   Resp 16   SpO2 97%   Physical Exam Vitals and nursing note reviewed.  Constitutional:      Appearance: Normal appearance. She is well-developed.  HENT:     Head: Atraumatic.     Nose: Nose normal.     Mouth/Throat:     Mouth: Mucous membranes are moist.  Eyes:  General: No scleral icterus.    Conjunctiva/sclera: Conjunctivae normal.  Neck:     Trachea: No tracheal deviation.  Cardiovascular:     Rate and Rhythm: Normal rate and regular rhythm.     Pulses: Normal pulses.     Heart sounds: Normal heart sounds. No murmur heard. No friction rub. No gallop.   Pulmonary:     Effort: Pulmonary effort is normal. No respiratory distress.     Breath sounds: Normal breath sounds.  Abdominal:     General: There is no distension.     Palpations: Abdomen is soft.     Tenderness: There is no abdominal tenderness.     Comments: On arrival to ED, balloon of tube is external to skin, and end of tube is in tract, however gastric contents have profusely leaked around tube and onto abd wall/diaper/towel.   Genitourinary:    Comments: No cva tenderness.  Musculoskeletal:        General: No swelling.     Cervical back: Normal range of motion and neck supple. No rigidity. No muscular tenderness.  Skin:    General: Skin is warm and dry.     Findings: No rash.  Neurological:     Mental Status: She is alert.     Comments: Alert, speech normal.   Psychiatric:        Mood and Affect: Mood normal.     ED Results / Procedures / Treatments   Labs (all  labs ordered are listed, but only abnormal results are displayed) Results for orders placed or performed during the hospital encounter of 04/29/20  Comprehensive metabolic panel  Result Value Ref Range   Sodium 146 (H) 135 - 145 mmol/L   Potassium 3.9 3.5 - 5.1 mmol/L   Chloride 108 98 - 111 mmol/L   CO2 23 22 - 32 mmol/L   Glucose, Bld 119 (H) 70 - 99 mg/dL   BUN 74 (H) 8 - 23 mg/dL   Creatinine, Ser 2.31 (H) 0.44 - 1.00 mg/dL   Calcium 9.9 8.9 - 10.3 mg/dL   Total Protein 8.5 (H) 6.5 - 8.1 g/dL   Albumin 3.9 3.5 - 5.0 g/dL   AST 34 15 - 41 U/L   ALT 24 0 - 44 U/L   Alkaline Phosphatase 77 38 - 126 U/L   Total Bilirubin 1.1 0.3 - 1.2 mg/dL   GFR, Estimated 19 (L) >60 mL/min   Anion gap 15 5 - 15  CBC  Result Value Ref Range   WBC 5.9 4.0 - 10.5 K/uL   RBC 3.45 (L) 3.87 - 5.11 MIL/uL   Hemoglobin 11.1 (L) 12.0 - 15.0 g/dL   HCT 32.8 (L) 36.0 - 46.0 %   MCV 95.1 80.0 - 100.0 fL   MCH 32.2 26.0 - 34.0 pg   MCHC 33.8 30.0 - 36.0 g/dL   RDW 15.3 11.5 - 15.5 %   Platelets 296 150 - 400 K/uL   nRBC 0.0 0.0 - 0.2 %   *Note: Due to a large number of results and/or encounters for the requested time period, some results have not been displayed. A complete set of results can be found in Results Review.   CT Abdomen Pelvis W Contrast  Result Date: 04/22/2020 CLINICAL DATA:  Abdominal pain.  Diffuse abdominal pain for 2 days EXAM: CT ABDOMEN AND PELVIS WITH CONTRAST TECHNIQUE: Multidetector CT imaging of the abdomen and pelvis was performed using the standard protocol following bolus administration of intravenous contrast. CONTRAST:  53mL  OMNIPAQUE IOHEXOL 300 MG/ML  SOLN COMPARISON:  CT 09/24/2019, interventional catheter replacement procedure 01/09/2020 FINDINGS: Lower chest: Lung bases are clear. Hepatobiliary: No focal hepatic lesion. Postcholecystectomy. No biliary dilatation. Several benign hepatic cysts. No biliary duct dilatation. Pancreas: Pancreas is normal. No ductal dilatation. No  pancreatic inflammation. Spleen: Normal spleen Adrenals/urinary tract: Adrenal glands and kidneys are normal. The ureters and bladder normal. Stomach/Bowel: Stomach is decompressed. There is a percutaneous gastrostomy tube with jejunal extension. The expanded retention bulb of the gastrostomy tube is within the access tract at the interface of the subcutaneous tissue and rectus abdominus (image 36/2). The J arm extensions is within the jejunum. No fluid collections in the peritoneal space or subcutaneous tissue. There is no evidence of bowel obstruction. The jejunum and ileum are normal. Colon is normal. There is a a ventral hernia RIGHT of midline which contains a nonobstructed loop of transverse colon. Findings not changed from comparison exam. Rectosigmoid colon normal. Vascular/Lymphatic: Abdominal aorta is normal caliber with atherosclerotic calcification. There is no retroperitoneal or periportal lymphadenopathy. No pelvic lymphadenopathy. Reproductive: Uterus and adnexa unremarkable. Other: No free fluid. Musculoskeletal: Sclerotic lesion in the L2 vertebral body associated endplate favored IMPRESSION: 1. Retention bulb of the percutaneous gastrostomy tube is expanded within the access tract at the junction of the subcutaneous fat and LEFT rectus abdominus muscle. Recommend interventional radiology exchange/repositioning. 2. Jejunal extension of the gastrostomy tube is in good position. No evidence of bowel obstruction. 3. Midline ventral hernia contains a segment of nonobstructed transverse colon. No change from prior. Electronically Signed   By: Suzy Bouchard M.D.   On: 04/22/2020 16:43   DG Foot Complete Right  Result Date: 04/01/2020 Please see detailed radiograph report in office note.   EKG None  Radiology No results found.  Procedures Procedures   Medications Ordered in ED Medications - No data to display  ED Course  I have reviewed the triage vital signs and the nursing  notes.  Pertinent labs & imaging results that were available during my care of the patient were reviewed by me and considered in my medical decision making (see chart for details).    MDM Rules/Calculators/A&P                         Reviewed nursing notes and prior charts for additional history. Recent ED visit and recent ct reviewed.   Currently, balloon is partially inflated but external to skin with small amount of tube in abd wall tract, w leaking of gastric contents around tube and onto abd wall/towels. Balloon checked and appears to work. Tube cleaned, and advanced, balloon re-inflated, and external circular rubber phlange snugged to abd wall - leaking around tube markedly improved.   KUB/tube study ordered to confirm location - radiology indicates do not have any type of adapter to use tube.   Labs reviewed/interpreted by me - pt with dehydration/AKI.  Iv ns boluses.   Given issues with tube, new AKI, weakness, need for new tube - pt consult hospitalists for admission, hydration.    Final Clinical Impression(s) / ED Diagnoses Final diagnoses:  PEG (percutaneous endoscopic gastrostomy) adjustment/replacement/removal [facility])    Rx / [place] Orders ED Discharge Orders    None       Lajean Saver, MD 04/29/20 [phone]

## 2020-04-30 ENCOUNTER — Other Ambulatory Visit: Payer: Self-pay

## 2020-04-30 ENCOUNTER — Inpatient Hospital Stay (HOSPITAL_COMMUNITY): Payer: Medicare Other

## 2020-04-30 DIAGNOSIS — N179 Acute kidney failure, unspecified: Principal | ICD-10-CM

## 2020-04-30 HISTORY — PX: IR GASTR TUBE CONVERT GASTR-JEJ PER W/FL MOD SED: IMG2332

## 2020-04-30 LAB — COMPREHENSIVE METABOLIC PANEL
ALT: 22 U/L (ref 0–44)
AST: 31 U/L (ref 15–41)
Albumin: 3.7 g/dL (ref 3.5–5.0)
Alkaline Phosphatase: 73 U/L (ref 38–126)
Anion gap: 13 (ref 5–15)
BUN: 65 mg/dL — ABNORMAL HIGH (ref 8–23)
CO2: 23 mmol/L (ref 22–32)
Calcium: 9.5 mg/dL (ref 8.9–10.3)
Chloride: 113 mmol/L — ABNORMAL HIGH (ref 98–111)
Creatinine, Ser: 1.88 mg/dL — ABNORMAL HIGH (ref 0.44–1.00)
GFR, Estimated: 24 mL/min — ABNORMAL LOW (ref >60)
Glucose, Bld: 109 mg/dL — ABNORMAL HIGH (ref 70–99)
Potassium: 3.8 mmol/L (ref 3.5–5.1)
Sodium: 149 mmol/L — ABNORMAL HIGH (ref 135–145)
Total Bilirubin: 0.8 mg/dL (ref 0.3–1.2)
Total Protein: 7.9 g/dL (ref 6.5–8.1)

## 2020-04-30 LAB — CBC
HCT: 31.9 % — ABNORMAL LOW (ref 36.0–46.0)
Hemoglobin: 10.5 g/dL — ABNORMAL LOW (ref 12.0–15.0)
MCH: 31.7 pg (ref 26.0–34.0)
MCHC: 32.9 g/dL (ref 30.0–36.0)
MCV: 96.4 fL (ref 80.0–100.0)
Platelets: 239 10*3/uL (ref 150–400)
RBC: 3.31 MIL/uL — ABNORMAL LOW (ref 3.87–5.11)
RDW: 15.6 % — ABNORMAL HIGH (ref 11.5–15.5)
WBC: 5.6 10*3/uL (ref 4.0–10.5)
nRBC: 0 % (ref 0.0–0.2)

## 2020-04-30 LAB — HEMOGLOBIN A1C
Hgb A1c MFr Bld: 6.2 % — ABNORMAL HIGH (ref 4.8–5.6)
Mean Plasma Glucose: 131.24 mg/dL

## 2020-04-30 LAB — GLUCOSE, CAPILLARY
Glucose-Capillary: 92 mg/dL (ref 70–99)
Glucose-Capillary: 67 mg/dL — ABNORMAL LOW (ref 70–99)
Glucose-Capillary: 119 mg/dL — ABNORMAL HIGH (ref 70–99)
Glucose-Capillary: 101 mg/dL — ABNORMAL HIGH (ref 70–99)
Glucose-Capillary: 162 mg/dL — ABNORMAL HIGH (ref 70–99)

## 2020-04-30 LAB — SARS CORONAVIRUS 2 BY RT PCR (HOSPITAL ORDER, PERFORMED IN ~~LOC~~ HOSPITAL LAB): SARS Coronavirus 2: NEGATIVE

## 2020-04-30 LAB — SARS CORONAVIRUS 2 (TAT 6-24 HRS): SARS Coronavirus 2: NEGATIVE

## 2020-04-30 MED ORDER — ONDANSETRON HCL 4 MG/2ML IJ SOLN: 4.0000 mg | Freq: Four times a day (QID) | INTRAMUSCULAR | Status: DC | PRN

## 2020-04-30 MED ORDER — FENTANYL CITRATE (PF) 100 MCG/2ML IJ SOLN
INTRAMUSCULAR | Status: AC | PRN
  Administered 2020-04-30: 25 ug via INTRAVENOUS

## 2020-04-30 MED ORDER — DEXTROSE 50 % IV SOLN
INTRAVENOUS | Status: AC
  Administered 2020-04-30: 12.5 mL
  Filled 2020-04-30: qty 50

## 2020-04-30 MED ORDER — SODIUM CHLORIDE 0.45 % IV SOLN: INTRAVENOUS | Status: DC

## 2020-04-30 MED ORDER — SODIUM CHLORIDE 0.9 % IV SOLN: INTRAVENOUS | Status: DC

## 2020-04-30 MED ORDER — HYDRALAZINE HCL 20 MG/ML IJ SOLN
5.0000 mg | INTRAMUSCULAR | Status: DC | PRN
  Administered 2020-05-01: 5 mg via INTRAVENOUS
  Filled 2020-04-30: qty 1

## 2020-04-30 MED ORDER — LIDOCAINE VISCOUS HCL 2 % MT SOLN
OROMUCOSAL | Status: AC
  Filled 2020-04-30: qty 15

## 2020-04-30 MED ORDER — MIDAZOLAM HCL 2 MG/2ML IJ SOLN
INTRAMUSCULAR | Status: AC | PRN
  Administered 2020-04-30: 0.5 mg via INTRAVENOUS

## 2020-04-30 MED ORDER — OSMOLITE 1.5 CAL PO LIQD
840.0000 mL | ORAL | Status: DC
  Administered 2020-04-30: 840 mL
  Filled 2020-04-30 (×5): qty 1000

## 2020-04-30 MED ORDER — ONDANSETRON HCL 4 MG PO TABS: 4.0000 mg | ORAL_TABLET | Freq: Four times a day (QID) | ORAL | Status: DC | PRN

## 2020-04-30 MED ORDER — IOHEXOL 300 MG/ML  SOLN
50.0000 mL | Freq: Once | INTRAMUSCULAR | Status: AC | PRN
  Administered 2020-04-30: 25 mL

## 2020-04-30 MED ORDER — DEXTROSE-NACL 5-0.45 % IV SOLN: INTRAVENOUS | Status: DC

## 2020-04-30 MED ORDER — FENTANYL CITRATE (PF) 100 MCG/2ML IJ SOLN
INTRAMUSCULAR | Status: AC
  Filled 2020-04-30: qty 2

## 2020-04-30 MED ORDER — INSULIN ASPART 100 UNIT/ML ~~LOC~~ SOLN: 0.0000 [IU] | Freq: Every day | SUBCUTANEOUS | Status: DC

## 2020-04-30 MED ORDER — MIDAZOLAM HCL 2 MG/2ML IJ SOLN
INTRAMUSCULAR | Status: AC
  Filled 2020-04-30: qty 2

## 2020-04-30 MED ORDER — INSULIN ASPART 100 UNIT/ML ~~LOC~~ SOLN
0.0000 [IU] | Freq: Three times a day (TID) | SUBCUTANEOUS | Status: DC
  Administered 2020-05-01: 1 [IU] via SUBCUTANEOUS
  Administered 2020-05-02: 2 [IU] via SUBCUTANEOUS

## 2020-04-30 MED ORDER — LIDOCAINE HCL 1 % IJ SOLN
INTRAMUSCULAR | Status: AC
  Filled 2020-04-30: qty 20

## 2020-04-30 MED ORDER — INSULIN ASPART 100 UNIT/ML ~~LOC~~ SOLN: 0.0000 [IU] | Freq: Three times a day (TID) | SUBCUTANEOUS | Status: DC

## 2020-04-30 MED ORDER — OSMOLITE 1.2 CAL PO LIQD: 1000.0000 mL | ORAL | Status: DC

## 2020-04-30 MED ORDER — FREE WATER
200.0000 mL | Freq: Four times a day (QID) | Status: DC
  Administered 2020-04-30 – 2020-05-02 (×8): 200 mL

## 2020-04-30 NOTE — Telephone Encounter (Signed)
Thank you for the update, glad she is getting some hydration and getting the tube fixed!

## 2020-04-30 NOTE — Procedures (Signed)
Interventional Radiology Procedure Note  Procedure: 22 fr GJ TUBE REPLACED    Complications: None  Estimated Blood Loss:  0  Findings: CONFIRMED IN THE PROX. JEJUNUM     Tamera Punt, MD

## 2020-04-30 NOTE — Progress Notes (Signed)
Patient assessed at the bedside following GJ tube exchange today in IR. She is awake, alert and her daughter is at the bedside.Patient states she is doing well. RN in the room doing teaching on [place] supplies. No questions or concerns from patient, her daughter or the RN.   Please call IR with any questions.   Soyla Dryer, [place] [phone] 04/30/2020, 4:33 PM

## 2020-04-30 NOTE — Progress Notes (Signed)
PROGRESS NOTE    Shannon [name]  GMW:[id number] DOB: November 14, 1923 DOA: 04/29/2020 PCP: Sharon Seller, NP   Brief Narrative: Shannon [name] is a 87 y.o. female with medical history significant of severe dysphagia with a G-tube in place now dislodged, A. fib, hypertension, asthma, COPD, diabetes, hypertension hyperlipidemia who has not had anything diet wise since Thursday of last week.  She was in the ER at the time apparently was home afterwards and had a G-tube was dislodged.  It fell off completely.  They called in IR and were told to reinsert the tube which they did.  Following the reinsertion daughter said her nutritional supply was not delivered.  They were also on Ensure if the tube was in the right place.  For that reason Shannon [name] did not have any food or drink for a week.  She came in with significant dehydration, AKI, weakness.  There was reported nausea but no significant vomiting.  No fever no chills.  Shannon [name] being admitted now for failure to thrive, AKI and significant dehydration..  ED Course: Temperature 97.4 blood pressure 114/72 pulse 92 respirate 16 oxygen sat 97% on room air.  Sodium 146, glucose 119.  Creatinine 2.31 with a baseline 1.26.  BUN 74.  CBC showed hemoglobin 11.1.  INR 1.0.  Urinalysis so far negative.   Assessment & Plan:   Principal Problem:   AKI (acute kidney injury) (HCC) Active Problems:   Accelerated hypertension   Malnutrition of moderate degree   Dysphagia   Weakness   Atrial fibrillation and flutter (HCC)   CHF (NYHA class IV, ACC/AHA stage D) (HCC)   COPD with asthma (HCC)    #1 AKI secondary to dehydration Shannon [name] was unable to take anything through the PEG tube due to malfunctioning PEG tube.  She has history of severe dysphagia. Renal functions improving with IV fluids Creatinine 1.88 from 2.31.  #2 dehydration continue IV fluids decrease the rate with history of CHF.  #3 history of COPD and CHF stable  #4 generalized weakness PT  consult  #5 stage II pressure injury to left buttocks see below   #6 hypernatremia-change fluids to half-normal saline.  #7 essential hypertension on multiple medications at home will restart once PEG tube is placed  #8 history of hypothyroidism on Synthroid at home  #9 history of type 2 diabetes on insulin will place her on SSI.    Pressure Injury 04/30/20 Buttocks Left;Upper Stage 2 -  Partial thickness loss of dermis presenting as a shallow open injury with a red, pink wound bed without slough. small 1/4cmx1/4cm round pressure wound, pink moist wound bed, no drainage (Active)  04/30/20 0400  Location: Buttocks  Location Orientation: Left;Upper  Staging: Stage 2 -  Partial thickness loss of dermis presenting as a shallow open injury with a red, pink wound bed without slough.  Wound Description (Comments): small 1/4cmx1/4cm round pressure wound, pink moist wound bed, no drainage  Present on Admission: Yes    Estimated body mass index is 21.63 kg/m as calculated from the following:   Height as of this encounter: 5\' 6"  (1.676 m).   Weight as of this encounter: 60.8 kg.  DVT prophylaxis: SCD Code Status: DNR Family Communication: None at bedside Disposition Plan:  Status is: Inpatient  Dispo: The Shannon [name] is from: Home              Anticipated d/c is to: Home              Anticipated  d/c date is: 1 day              Shannon [name] currently is not medically stable to d/c.   Difficult to place Shannon [name] NA   Consultants: Interventional radiology  Procedures: None Antimicrobials: None  Subjective: Shannon [name] is resting in bed awake alert oriented very pleasant young lady in no acute distress complains of pain at the site of PEG tube insertion  Objective: Vitals:   04/29/20 2302 04/30/20 0100 04/30/20 0408 04/30/20 0924  BP: (!) 149/72 (!) 161/74 (!) 166/72 (!) 155/70  Pulse: 79 74 76 78  Resp: 16 16 16 15   Temp:   97.6 F (36.4 C) (!) 97.4 F (36.3 C)  TempSrc:   Oral Oral   SpO2: 100% 98% 100% 100%  Weight:   60.8 kg   Height:   5\' 6"  (1.676 m)     Intake/Output Summary (Last 24 hours) at 04/30/2020 1048 Last data filed at 04/30/2020 0600 Gross per 24 hour  Intake 250 ml  Output 225 ml  Net 25 ml   Filed Weights   04/30/20 0408  Weight: 60.8 kg    Examination:  General exam: Appears calm and comfortable  Respiratory system: Clear to auscultation. Respiratory effort normal. Cardiovascular system: S1 & S2 heard, RRR. No JVD, murmurs, rubs, gallops or clicks. No pedal edema. Gastrointestinal system: Abdomen is nondistended, soft and nontender. No organomegaly or masses felt. Normal bowel sounds heard.  PEG in place complains of pain on palpation at the PEG site Central nervous system: Alert and oriented. No focal neurological deficits. Extremities: Symmetric 5 x 5 power. Skin: No rashes, lesions or ulcers Psychiatry: Judgement and insight appear normal. Mood & affect appropriate.     Data Reviewed: I have personally reviewed following labs and imaging studies  CBC: Recent Labs  Lab 04/29/20 1827 04/30/20 0538  WBC 5.9 5.6  HGB 11.1* 10.5*  HCT 32.8* 31.9*  MCV 95.1 96.4  PLT 296 239   Basic Metabolic Panel: Recent Labs  Lab 04/29/20 1827 04/30/20 0538  NA 146* 149*  K 3.9 3.8  CL 108 113*  CO2 23 23  GLUCOSE 119* 109*  BUN 74* 65*  CREATININE 2.31* 1.88*  CALCIUM 9.9 9.5   GFR: Estimated Creatinine Clearance: 17.1 mL/min (A) (by C-G formula based on SCr of 1.88 mg/dL (H)). Liver Function Tests: Recent Labs  Lab 04/29/20 1827 04/30/20 0538  AST 34 31  ALT 24 22  ALKPHOS 77 73  BILITOT 1.1 0.8  PROT 8.5* 7.9  ALBUMIN 3.9 3.7   No results for input(s): LIPASE, AMYLASE in the last 168 hours. No results for input(s): AMMONIA in the last 168 hours. Coagulation Profile: No results for input(s): INR, PROTIME in the last 168 hours. Cardiac Enzymes: No results for input(s): CKTOTAL, CKMB, CKMBINDEX, TROPONINI in the last  168 hours. BNP (last 3 results) No results for input(s): PROBNP in the last 8760 hours. HbA1C: Recent Labs    04/30/20 0538  HGBA1C 6.2*   CBG: Recent Labs  Lab 04/30/20 0739  GLUCAP 92   Lipid Profile: No results for input(s): CHOL, HDL, LDLCALC, TRIG, CHOLHDL, LDLDIRECT in the last 72 hours. Thyroid Function Tests: No results for input(s): TSH, T4TOTAL, FREET4, T3FREE, THYROIDAB in the last 72 hours. Anemia Panel: No results for input(s): VITAMINB12, FOLATE, FERRITIN, TIBC, IRON, RETICCTPCT in the last 72 hours. Sepsis Labs: No results for input(s): PROCALCITON, LATICACIDVEN in the last 168 hours.  Recent Results (from the past 240 hour(s))  SARS CORONAVIRUS 2 (TAT 6-24 HRS) Nasopharyngeal Nasopharyngeal Swab     Status: None   Collection Time: 04/29/20  8:26 PM   Specimen: Nasopharyngeal Swab  Result Value Ref Range Status   SARS Coronavirus 2 NEGATIVE NEGATIVE Final    Comment: (NOTE) SARS-CoV-2 target nucleic acids are NOT DETECTED.  The SARS-CoV-2 RNA is generally detectable in upper and lower respiratory specimens during the acute phase of infection. Negative results do not preclude SARS-CoV-2 infection, do not rule out co-infections with other pathogens, and should not be used as the sole basis for treatment or other Shannon [name] management decisions. Negative results must be combined with clinical observations, Shannon [name] history, and epidemiological information. The expected result is Negative.  Fact Sheet for Patients: HairSlick.no  Fact Sheet for Healthcare Providers: quierodirigir.com  This test is not yet approved or cleared by the [place] FDA and  has been authorized for detection and/or diagnosis of SARS-CoV-2 by FDA under an Emergency Use Authorization (EUA). This EUA will remain  in effect (meaning this test can be used) for the duration of the COVID-19 declaration under Se ction 564(b)(1) of the  Act, 21 U.S.C. section 360bbb-3(b)(1), unless the authorization is terminated or revoked sooner.  Performed at [facility], [address], [place], [place] [address]   SARS Coronavirus 2 by RT PCR (hospital order, performed in [facility] hospital lab) Nasopharyngeal Nasopharyngeal Swab     Status: None   Collection Time: 04/30/20  2:15 AM   Specimen: Nasopharyngeal Swab  Result Value Ref Range Status   SARS Coronavirus 2 NEGATIVE NEGATIVE Final    Comment: (NOTE) SARS-CoV-2 target nucleic acids are NOT DETECTED.  The SARS-CoV-2 RNA is generally detectable in upper and lower respiratory specimens during the acute phase of infection. The lowest concentration of SARS-CoV-2 viral copies this assay can detect is 250 copies / mL. A negative result does not preclude SARS-CoV-2 infection and should not be used as the sole basis for treatment or other Shannon [name] management decisions.  A negative result may occur with improper specimen collection / handling, submission of specimen other than nasopharyngeal swab, presence of viral mutation(s) within the areas targeted by this assay, and inadequate number of viral copies (<250 copies / mL). A negative result must be combined with clinical observations, Shannon [name] history, and epidemiological information.  Fact Sheet for Patients:   BoilerBrush.com.cy  Fact Sheet for Healthcare Providers: https://pope.com/  This test is not yet approved or  cleared by the [place] FDA and has been authorized for detection and/or diagnosis of SARS-CoV-2 by FDA under an Emergency Use Authorization (EUA).  This EUA will remain in effect (meaning this test can be used) for the duration of the COVID-19 declaration under Section 564(b)(1) of the Act, 21 U.S.C. section 360bbb-3(b)(1), unless the authorization is terminated or revoked sooner.  Performed at [facility], [address] [place], [place] [address]          Radiology Studies: DG ABDOMEN PEG TUBE LOCATION  Result Date: 04/29/2020 CLINICAL DATA:  Peg tube replacement EXAM: ABDOMEN - 1 VIEW COMPARISON:  09/24/2019 FINDINGS: Supine frontal view of the abdomen and pelvis excluding the right flank was performed after the clinician injected contrast through the indwelling percutaneous gastrostomy tube. No evidence of contrast extravasation, with injected contrast outlining the rugal folds. Bowel gas pattern is unremarkable. IMPRESSION: 1. Peg tube within the gastric lumen. No evidence of contrast extravasation. Electronically Signed   By: Maxwell Caul.D.  On: 04/29/2020 19:56        Scheduled Meds: . insulin aspart  0-5 Units Subcutaneous QHS  . insulin aspart  0-9 Units Subcutaneous TID WC   Continuous Infusions: . sodium chloride 125 mL/hr at 04/30/20 0422     LOS: 1 day      Alwyn Ren, MD 04/30/2020, 10:48 AM

## 2020-04-30 NOTE — Progress Notes (Signed)
Initial Nutrition Assessment  DOCUMENTATION CODES:   Non-severe (moderate) malnutrition in context of chronic illness  INTERVENTION:  - will place order for TF for after GJ placed and okay for use.  - Osmolite 1.5 @ 70 ml/hr x12 hours/day (840 ml, run 2000-0800) with 200 ml free water QID. - this regimen will provide 1260 kcal (95% kcal need), 69 grams protein, and 1437 ml free water.   NUTRITION DIAGNOSIS:   Moderate Malnutrition related to chronic illness (severe dysphagia) as evidenced by moderate fat depletion,moderate muscle depletion  GOAL:   Patient will meet greater than or equal to 90% of their needs  MONITOR:   TF tolerance,Labs,Weight trends,Skin  REASON FOR ASSESSMENT:   Malnutrition Screening Tool,Consult  ASSESSMENT:   87 y.o. female with medical history of afib, HTN, asthma, COPD, DM, HLD, and severe dysphagia with a G-tube that has become dislodged. Patient's daughter reported that d/t tube dislodging and TF formula not being delivered, patient did not have any nutrition x1 week PTA. In the ED, she was found to be severely dehydrated and weak and was dx with AKI.  Patient has been NPO since admission. Patient laying in bed with no family/visitors present, although able to talk with patient's daughter on the phone briefly and daughter provided information about home TF regimen.   At home, patient was taking most of her medications PO. She did administer one via G-tube. She was consuming sips of beverages (such as ginger ale) and bites of items such as pudding, jello, and yogurt each day. She was drinking protein shakes/oral nutrition supplements PTA. She has had Nepro in the past and received an unknown brand of supplement at home. She would typically drink 2-3 oz of the supplement/day.   Her home TF regimen was 711 ml Osmolite 1.5 poured into a bag and run at 65 ml/hr x12 hours/day (2000-0800) with 180 ml free water TID.  This regimen was providing 1066 kcal, 59  grams protein, and 1080 ml free water.   Plan is for replacement of PEG with GJ tube by IR, likely today.   Weight today is 134 lb and weight has been stable for the past almost 6 months. Weight on 10/20/19 was 137 lb.   Patient was noted to be severely dehydrated at the time of admission.    Labs reviewed; Na: 149 mmol/l, Cl: 113 mmol/l, BUN: 65 mg/dl, creatinine: 1.88 mg/dl, GFR: 24 ml/min.  Medications reviewed; sliding scale novolog.  IVF; NS @ 125 ml/hr decreased to 75 ml/hr starting today at 1200.    NUTRITION - FOCUSED PHYSICAL EXAM:  Flowsheet Row Most Recent Value  Orbital Region Mild depletion  Upper Arm Region Moderate depletion  Thoracic and Lumbar Region Moderate depletion  Buccal Region Moderate depletion  Temple Region Moderate depletion  Clavicle Bone Region Moderate depletion  Clavicle and Acromion Bone Region Moderate depletion  Dorsal Hand Moderate depletion  Patellar Region Severe depletion  Anterior Thigh Region Moderate depletion  Posterior Calf Region Severe depletion  Edema (RD Assessment) None  Hair Reviewed  Eyes Reviewed  Mouth Reviewed  Skin Reviewed  Nails Reviewed       Diet Order:   Diet Order            Diet NPO time specified  Diet effective now                 EDUCATION NEEDS:   No education needs have been identified at this time  Skin:  Skin Assessment: Skin Integrity  Issues: Skin Integrity Issues:: Stage II,Diabetic Ulcer Stage II: L buttocks Diabetic Ulcer: L toe  Last BM:  PTA/unknown  Height:   Ht Readings from Last 1 Encounters:  04/30/20 5\' 6"  (1.676 m)    Weight:   Wt Readings from Last 1 Encounters:  04/30/20 60.8 kg     Estimated Nutritional Needs:  Kcal:  1325-1525 kcal Protein:  60-75 grams Fluid:  >/= 2 L/day     Jarome Matin, MS, RD, LDN, CNSC Inpatient Clinical Dietitian RD pager # available in [facility]  After hours/weekend pager # available in [facility]

## 2020-04-30 NOTE — ED Notes (Signed)
Attempted to call report on pt, RN will call back.

## 2020-04-30 NOTE — Telephone Encounter (Signed)
Called and spoke with [name]. She stated that she called EMS last night and they took patient to [facility] that she was severely dehydrated. They are giving her fluids and hoping the tube comes in today to replace.

## 2020-04-30 NOTE — Plan of Care (Signed)
  Problem: Safety: Goal: Ability to remain free from injury will improve Outcome: Progressing   Problem: Pain Managment: Goal: General experience of comfort will improve Outcome: Progressing   Problem: Skin Integrity: Goal: Risk for impaired skin integrity will decrease Outcome: Progressing   Problem: Elimination: Goal: Will not experience complications related to bowel motility Outcome: Progressing Goal: Will not experience complications related to urinary retention Outcome: Progressing

## 2020-04-30 NOTE — Plan of Care (Signed)
  Problem: Nutrition: Goal: Adequate nutrition will be maintained Outcome: Progressing   Problem: Safety: Goal: Ability to remain free from injury will improve Outcome: Progressing   Problem: Skin Integrity: Goal: Risk for impaired skin integrity will decrease Outcome: Progressing   

## 2020-04-30 NOTE — Progress Notes (Signed)
IR received request to exchange patient's G-J tube. Presently, IR does not have the same tube the patient currently has. It has been ordered and will tentatively arrive today, 04/30/20. I spoke with the patient's daughter, [name], to notify her that we are still waiting on the preferred tube. A decision was made to wait until 12:30 today to see if that tube arrives. If it has not arrived, we will plan to proceed with a G-J tube exchange today using the standard tube we have in stock. I will contact [name] at 12:30 to update her but IR will plan to bring the patient down this afternoon for a GJ tube exchange.   Please keep patient NPO. Please call IR with any questions.   Soyla Dryer, [place] [phone] 04/30/2020, 9:18 AM

## 2020-05-01 LAB — GLUCOSE, CAPILLARY
Glucose-Capillary: 164 mg/dL — ABNORMAL HIGH (ref 70–99)
Glucose-Capillary: 121 mg/dL — ABNORMAL HIGH (ref 70–99)
Glucose-Capillary: 105 mg/dL — ABNORMAL HIGH (ref 70–99)
Glucose-Capillary: 145 mg/dL — ABNORMAL HIGH (ref 70–99)

## 2020-05-01 LAB — COMPREHENSIVE METABOLIC PANEL
ALT: 20 U/L (ref 0–44)
AST: 31 U/L (ref 15–41)
Albumin: 3.2 g/dL — ABNORMAL LOW (ref 3.5–5.0)
Alkaline Phosphatase: 65 U/L (ref 38–126)
Anion gap: 12 (ref 5–15)
BUN: 43 mg/dL — ABNORMAL HIGH (ref 8–23)
CO2: 22 mmol/L (ref 22–32)
Calcium: 9 mg/dL (ref 8.9–10.3)
Chloride: 109 mmol/L (ref 98–111)
Creatinine, Ser: 1.28 mg/dL — ABNORMAL HIGH (ref 0.44–1.00)
GFR, Estimated: 39 mL/min — ABNORMAL LOW (ref >60)
Glucose, Bld: 164 mg/dL — ABNORMAL HIGH (ref 70–99)
Potassium: 3.7 mmol/L (ref 3.5–5.1)
Sodium: 143 mmol/L (ref 135–145)
Total Bilirubin: 0.6 mg/dL (ref 0.3–1.2)
Total Protein: 7.4 g/dL (ref 6.5–8.1)

## 2020-05-01 LAB — CBC
HCT: 32.8 % — ABNORMAL LOW (ref 36.0–46.0)
Hemoglobin: 10.5 g/dL — ABNORMAL LOW (ref 12.0–15.0)
MCH: 31.5 pg (ref 26.0–34.0)
MCHC: 32 g/dL (ref 30.0–36.0)
MCV: 98.5 fL (ref 80.0–100.0)
Platelets: 223 10*3/uL (ref 150–400)
RBC: 3.33 MIL/uL — ABNORMAL LOW (ref 3.87–5.11)
RDW: 15.4 % (ref 11.5–15.5)
WBC: 5.1 10*3/uL (ref 4.0–10.5)
nRBC: 0 % (ref 0.0–0.2)

## 2020-05-01 LAB — PROTIME-INR
INR: 2.8 — ABNORMAL HIGH (ref 0.8–1.2)
Prothrombin Time: 28.6 seconds — ABNORMAL HIGH (ref 11.4–15.2)

## 2020-05-01 MED ORDER — LEVOTHYROXINE SODIUM 125 MCG PO TABS
125.0000 ug | ORAL_TABLET | Freq: Every day | ORAL | Status: DC
  Administered 2020-05-01: 125 ug via ORAL
  Filled 2020-05-01: qty 1

## 2020-05-01 MED ORDER — GABAPENTIN 300 MG PO CAPS
300.0000 mg | ORAL_CAPSULE | Freq: Two times a day (BID) | ORAL | Status: DC
  Administered 2020-05-01 – 2020-05-02 (×2): 300 mg via ORAL
  Filled 2020-05-01 (×3): qty 1

## 2020-05-01 MED ORDER — SENNOSIDES-DOCUSATE SODIUM 8.6-50 MG PO TABS: 1.0000 | ORAL_TABLET | ORAL | Status: DC | PRN

## 2020-05-01 MED ORDER — PANTOPRAZOLE SODIUM 40 MG PO TBEC
40.0000 mg | DELAYED_RELEASE_TABLET | Freq: Every day | ORAL | Status: DC
  Administered 2020-05-01 – 2020-05-02 (×2): 40 mg via ORAL
  Filled 2020-05-01 (×2): qty 1

## 2020-05-01 MED ORDER — CALCIUM CARBONATE-VITAMIN D 500-200 MG-UNIT PO TABS
1.0000 | ORAL_TABLET | Freq: Every day | ORAL | Status: DC
  Administered 2020-05-02: 1 via ORAL
  Filled 2020-05-01: qty 1

## 2020-05-01 MED ORDER — AMLODIPINE BESYLATE 5 MG PO TABS
5.0000 mg | ORAL_TABLET | Freq: Every day | ORAL | Status: DC
  Administered 2020-05-01 – 2020-05-02 (×2): 5 mg via ORAL
  Filled 2020-05-01 (×2): qty 1

## 2020-05-01 MED ORDER — MOMETASONE FURO-FORMOTEROL FUM 200-5 MCG/ACT IN AERO
2.0000 | INHALATION_SPRAY | Freq: Two times a day (BID) | RESPIRATORY_TRACT | Status: DC
  Administered 2020-05-01 – 2020-05-02 (×2): 2 via RESPIRATORY_TRACT
  Filled 2020-05-01: qty 8.8

## 2020-05-01 MED ORDER — BENZOCAINE 20 % RE OINT: TOPICAL_OINTMENT | RECTAL | Status: DC | PRN

## 2020-05-01 MED ORDER — CYCLOSPORINE 0.05 % OP EMUL
1.0000 [drp] | Freq: Two times a day (BID) | OPHTHALMIC | Status: DC
  Administered 2020-05-01 – 2020-05-02 (×2): 1 [drp] via OPHTHALMIC
  Filled 2020-05-01 (×2): qty 1

## 2020-05-01 MED ORDER — WARFARIN SODIUM 5 MG PO TABS: 5.0000 mg | ORAL_TABLET | ORAL | Status: DC

## 2020-05-01 MED ORDER — B COMPLEX PO TABS: 1.0000 | ORAL_TABLET | Freq: Every day | ORAL | Status: DC

## 2020-05-01 MED ORDER — B COMPLEX-C PO TABS
1.0000 | ORAL_TABLET | Freq: Every day | ORAL | Status: DC
  Administered 2020-05-01 – 2020-05-02 (×2): 1 via ORAL
  Filled 2020-05-01 (×2): qty 1

## 2020-05-01 MED ORDER — LATANOPROST 0.005 % OP SOLN
1.0000 [drp] | Freq: Every day | OPHTHALMIC | Status: DC
  Administered 2020-05-01: 1 [drp] via OPHTHALMIC
  Filled 2020-05-01: qty 2.5

## 2020-05-01 MED ORDER — ACETAMINOPHEN 500 MG PO TABS: 500.0000 mg | ORAL_TABLET | Freq: Four times a day (QID) | ORAL | Status: DC | PRN

## 2020-05-01 MED ORDER — ATORVASTATIN CALCIUM 10 MG PO TABS
10.0000 mg | ORAL_TABLET | Freq: Every day | ORAL | Status: DC
  Administered 2020-05-01 – 2020-05-02 (×2): 10 mg via ORAL
  Filled 2020-05-01 (×2): qty 1

## 2020-05-01 MED ORDER — IPRATROPIUM-ALBUTEROL 0.5-2.5 (3) MG/3ML IN SOLN: 3.0000 mL | Freq: Four times a day (QID) | RESPIRATORY_TRACT | Status: DC | PRN

## 2020-05-01 MED ORDER — ISOSORBIDE MONONITRATE ER 60 MG PO TB24
60.0000 mg | ORAL_TABLET | Freq: Every day | ORAL | Status: DC
  Administered 2020-05-01 – 2020-05-02 (×2): 60 mg via ORAL
  Filled 2020-05-01 (×2): qty 1

## 2020-05-01 MED ORDER — HYDRALAZINE HCL 50 MG PO TABS
50.0000 mg | ORAL_TABLET | Freq: Three times a day (TID) | ORAL | Status: DC
  Administered 2020-05-01 – 2020-05-02 (×2): 50 mg via ORAL
  Filled 2020-05-01 (×3): qty 1

## 2020-05-01 MED ORDER — DULOXETINE HCL 30 MG PO CPEP
30.0000 mg | ORAL_CAPSULE | Freq: Every day | ORAL | Status: DC
  Administered 2020-05-01 – 2020-05-02 (×2): 30 mg via ORAL
  Filled 2020-05-01 (×2): qty 1

## 2020-05-01 NOTE — TOC Transition Note (Addendum)
Transition of Care [facility]) - CM/SW Discharge Note   Patient Details  Name: Shannon [name] MRN: [id number] Date of Birth: 20-Jun-1923  [facility] CM/SW Contact:  Elliot Gurney [place], [place] Phone Number:  05/01/2020, 2:02 PM   Clinical Narrative:    Transition of Care consulted to assist patient with G tube supplies.Patient already receiving DME from [employer].  [employer] contacted as well on call RD, needed supplies discussed. It was confirmed that all supplies will not be able to be provided until  Monday. Patient cannot discharge until all supplies are received.  TOC supervisor Olga Coaster consulted, attending provider and RN informed.  [address], LCSW Transition of Care [phone]            Patient Goals and CMS Choice        Discharge Placement                       Discharge Plan and Services                                     Social Determinants of Health (SDOH) Interventions     Readmission Risk Interventions No flowsheet data found.

## 2020-05-01 NOTE — Evaluation (Signed)
Physical Therapy Evaluation Patient Details Name: Shannon [name] MRN: [id number] DOB: [birth year] Today's Date: 05/01/2020   History of Present Illness  87 yo female with hx of R transmet amputation on 11/14/19 PMH also includes: arthritis, CAD, CHF, CKD III, COPD, DVT (in 2017), HLD, HTN, PAD, afib, and DM II with peripheral neuropathy.  Pt admitted for AKI secondary to dehydration patient was unable to take anything through the PEG tube due to malfunctioning PEG tube. JG tube replaced 04/30/20.  Clinical Impression  Pt admitted with above diagnosis.  Pt currently with functional limitations due to the deficits listed below (see PT Problem List). Pt will benefit from skilled PT to increase their independence and safety with mobility to allow discharge to the venue listed below.  Pt very pleasant and eager to mobilize as she wishes to d/c home soon.  Pt ambulated in hallway with RW and then assisted to bathroom.  Pt assisted with pericare and washed hands at sink.  Pt reports "wet" abdomen upon return to recliner and observed leaking fluid from PEG site with saturated dressing so RN notified. (Also used gait belt for safety however placed directly under armpits to avoid rubbing on PEG site area.)  Pt feels confident and comfortable with return home with assist as needed from daughter.     Follow Up Recommendations No PT follow up    Equipment Recommendations  None recommended by PT    Recommendations for Other Services       Precautions / Restrictions Precautions Precautions: Fall Precaution Comments: PEG, R transmet amputation      Mobility  Bed Mobility Overal bed mobility: Needs Assistance Bed Mobility: Supine to Sit     Supine to sit: Supervision;HOB elevated     General bed mobility comments: effortful and increased time however no assist required    Transfers Overall transfer level: Needs assistance Equipment used: Rolling walker (2 wheeled) Transfers: Sit to/from  Stand Sit to Stand: Min assist         General transfer comment: assist to rise and steady however only from toilet, min/guard from bed and chair  Ambulation/Gait Ambulation/Gait assistance: Min guard Gait Distance (Feet): 100 Feet Assistive device: Rolling walker (2 wheeled) Gait Pattern/deviations: Step-through pattern;Decreased stride length     General Gait Details: pt reports mild "wooziness" however able to finish ambulating (chair was following for safety however not required); BP elevated upon return to room and RN notified  Stairs            Wheelchair Mobility    Modified Rankin (Stroke Patients Only)       Balance Overall balance assessment: Needs assistance         Standing balance support: Bilateral upper extremity supported Standing balance-Leahy Scale: Poor Standing balance comment: utilizes UE support, denies any recent falls                             Pertinent Vitals/Pain Pain Assessment: No/denies pain    Home Living Family/patient expects to be discharged to:: Private residence Living Arrangements: Children (daughter) Available Help at Discharge: Family;Available 24 hours/day Type of Home: House Home Access: Ramped entrance     Home Layout: One level Home Equipment: Walker - 4 wheels;Bedside commode;Tub bench;Hand held shower head      Prior Function Level of Independence: Needs assistance   Gait / Transfers Assistance Needed: rollator for short ambulation distances   ADL's / Homemaking Assistance Needed: pt reorts  independence with ADLs, daughter assists with medication managmenet, shower transfers, and cooking        Hand Dominance        Extremity/Trunk Assessment   Upper Extremity Assessment Upper Extremity Assessment: Overall WFL for tasks assessed    Lower Extremity Assessment Lower Extremity Assessment: Overall WFL for tasks assessed       Communication   Communication: HOH  Cognition  Arousal/Alertness: Awake/alert Behavior During Therapy: WFL for tasks assessed/performed Overall Cognitive Status: Within Functional Limits for tasks assessed                                        General Comments      Exercises     Assessment/Plan    PT Assessment Patient needs continued PT services  PT Problem List Decreased strength;Decreased mobility;Decreased activity tolerance;Decreased balance;Decreased knowledge of use of DME       PT Treatment Interventions DME instruction;Gait training;Balance training;Therapeutic exercise;Functional mobility training;Therapeutic activities;Patient/family education    PT Goals (Current goals can be found in the Care Plan section)  Acute Rehab PT Goals PT Goal Formulation: With patient Time For Goal Achievement: 05/15/20 Potential to Achieve Goals: Good    Frequency Min 3X/week   Barriers to discharge        Co-evaluation               AM-PAC PT "6 Clicks" Mobility  Outcome Measure Help needed turning from your back to your side while in a flat bed without using bedrails?: A Little Help needed moving from lying on your back to sitting on the side of a flat bed without using bedrails?: A Little Help needed moving to and from a bed to a chair (including a wheelchair)?: A Little Help needed standing up from a chair using your arms (e.g., wheelchair or bedside chair)?: A Little Help needed to walk in hospital room?: A Little Help needed climbing 3-5 steps with a railing? : A Lot 6 Click Score: 17    End of Session Equipment Utilized During Treatment: Gait belt Activity Tolerance: Patient tolerated treatment well Patient left: in chair;with call bell/phone within reach;with chair alarm set Nurse Communication: Mobility status PT Visit Diagnosis: Difficulty in walking, not elsewhere classified (R26.2)    Time: 7829-5621 PT Time Calculation (min) (ACUTE ONLY): 31 min   Charges:   PT Evaluation $PT  Eval Low Complexity: 1 Low PT Treatments $Gait Training: 8-22 mins       Thomasene Mohair PT, DPT Acute Rehabilitation Services Pager: [phone] Office: [phone]  Maida Sale E 05/01/2020, 1:41 PM

## 2020-05-01 NOTE — Progress Notes (Signed)
PROGRESS NOTE    Shannon [name]  OZD:[id number] DOB: Mar 07, 1924 DOA: 04/29/2020 PCP: Sharon Seller, NP   Brief Narrative: Shannon [name] is a 87 y.o. female with medical history significant of severe dysphagia with a G-tube in place now dislodged, A. fib, hypertension, asthma, COPD, diabetes, hypertension hyperlipidemia who has not had anything diet wise since Thursday of last week.  She was in the ER at the time apparently was home afterwards and had a G-tube was dislodged.  It fell off completely.  They called in IR and were told to reinsert the tube which they did.  Following the reinsertion daughter said her nutritional supply was not delivered.  They were also on Ensure if the tube was in the right place.  For that reason patient did not have any food or drink for a week.  She came in with significant dehydration, AKI, weakness.  There was reported nausea but no significant vomiting.  No fever no chills.  Patient being admitted now for failure to thrive, AKI and significant dehydration..  ED Course: Temperature 97.4 blood pressure 114/72 pulse 92 respirate 16 oxygen sat 97% on room air.  Sodium 146, glucose 119.  Creatinine 2.31 with a baseline 1.26.  BUN 74.  CBC showed hemoglobin 11.1.  INR 1.0.  Urinalysis so far negative.   Assessment & Plan:   Principal Problem:   AKI (acute kidney injury) (HCC) Active Problems:   Accelerated hypertension   Malnutrition of moderate degree   Dysphagia   Weakness   Atrial fibrillation and flutter (HCC)   CHF (NYHA class IV, ACC/AHA stage D) (HCC)   COPD with asthma (HCC)    #1 AKI secondary to dehydration patient was unable to take anything through the PEG tube due to malfunctioning PEG tube.  She has history of severe dysphagia. Renal functions improving with IV fluids Creatinine 1.88 from 2.31. She had GJ tube placed by IR which is different from her prior tube pta.they do not have supplies needed for the new tube until Monday.  #2  dehydration continue IV fluids decrease the rate with history of CHF.  #3 history of COPD and CHF stable  #4 generalized weakness PT consult  #5 stage II pressure injury to left buttocks see below   #6 hypernatremia-change fluids to half-normal saline.  #7 essential hypertension on multiple medications at home will restart once PEG tube is placed  #8 history of hypothyroidism on Synthroid at home  #9 history of type 2 diabetes on insulin will place her on SSI.    Pressure Injury 04/30/20 Buttocks Left;Upper Stage 2 -  Partial thickness loss of dermis presenting as a shallow open injury with a red, pink wound bed without slough. small 1/4cmx1/4cm round pressure wound, pink moist wound bed, no drainage (Active)  04/30/20 0400  Location: Buttocks  Location Orientation: Left;Upper  Staging: Stage 2 -  Partial thickness loss of dermis presenting as a shallow open injury with a red, pink wound bed without slough.  Wound Description (Comments): small 1/4cmx1/4cm round pressure wound, pink moist wound bed, no drainage  Present on Admission: Yes    Estimated body mass index is 21.35 kg/m as calculated from the following:   Height as of this encounter: 5\' 6"  (1.676 m).   Weight as of this encounter: 60 kg.  DVT prophylaxis: SCD Code Status: DNR Family Communication: None at bedside Disposition Plan:  Status is: Inpatient  Dispo: The patient is from: Home  Anticipated d/c is to: Home              Anticipated d/c date is: 1 day              Patient currently is not medically stable to d/c.   Difficult to place patient NA   Consultants: Interventional radiology  Procedures: None Antimicrobials: None  Subjective: Patient is resting in bed awake alert oriented very pleasant young lady in no acute distress complains of pain at the site of PEG tube insertion  Objective: Vitals:   05/01/20 0500 05/01/20 0504 05/01/20 1100 05/01/20 1354  BP:  (!) 150/85 (!) 162/92 (!)  197/73  Pulse:  72 78 69  Resp:  16  18  Temp:  98.5 F (36.9 C)  (!) 97.4 F (36.3 C)  TempSrc:  Oral  Oral  SpO2:  95%  100%  Weight: 60 kg     Height:        Intake/Output Summary (Last 24 hours) at 05/01/2020 1511 Last data filed at 05/01/2020 1000 Gross per 24 hour  Intake 2304.82 ml  Output 750 ml  Net 1554.82 ml   Filed Weights   04/30/20 0408 05/01/20 0500  Weight: 60.8 kg 60 kg    Examination:  General exam: Appears calm and comfortable  Respiratory system: Clear to auscultation. Respiratory effort normal. Cardiovascular system: S1 & S2 heard, RRR. No JVD, murmurs, rubs, gallops or clicks. No pedal edema. Gastrointestinal system: Abdomen is nondistended, soft and nontender. No organomegaly or masses felt. Normal bowel sounds heard.  PEG in place complains of pain on palpation at the PEG site Central nervous system: Alert and oriented. No focal neurological deficits. Extremities: Symmetric 5 x 5 power. Skin: No rashes, lesions or ulcers Psychiatry: Judgement and insight appear normal. Mood & affect appropriate.     Data Reviewed: I have personally reviewed following labs and imaging studies  CBC: Recent Labs  Lab 04/29/20 1827 04/30/20 0538 05/01/20 0635  WBC 5.9 5.6 5.1  HGB 11.1* 10.5* 10.5*  HCT 32.8* 31.9* 32.8*  MCV 95.1 96.4 98.5  PLT 296 239 223   Basic Metabolic Panel: Recent Labs  Lab 04/29/20 1827 04/30/20 0538 05/01/20 0635  NA 146* 149* 143  K 3.9 3.8 3.7  CL 108 113* 109  CO2 23 23 22   GLUCOSE 119* 109* 164*  BUN 74* 65* 43*  CREATININE 2.31* 1.88* 1.28*  CALCIUM 9.9 9.5 9.0   GFR: Estimated Creatinine Clearance: 25.2 mL/min (A) (by C-G formula based on SCr of 1.28 mg/dL (H)). Liver Function Tests: Recent Labs  Lab 04/29/20 1827 04/30/20 0538 05/01/20 0635  AST 34 31 31  ALT 24 22 20   ALKPHOS 77 73 65  BILITOT 1.1 0.8 0.6  PROT 8.5* 7.9 7.4  ALBUMIN 3.9 3.7 3.2*   No results for input(s): LIPASE, AMYLASE in the last  168 hours. No results for input(s): AMMONIA in the last 168 hours. Coagulation Profile: Recent Labs  Lab 05/01/20 0635  INR 2.8*   Cardiac Enzymes: No results for input(s): CKTOTAL, CKMB, CKMBINDEX, TROPONINI in the last 168 hours. BNP (last 3 results) No results for input(s): PROBNP in the last 8760 hours. HbA1C: Recent Labs    04/30/20 0538  HGBA1C 6.2*   CBG: Recent Labs  Lab 04/30/20 1215 04/30/20 1647 04/30/20 2129 05/01/20 0747 05/01/20 1232  GLUCAP 119* 101* 162* 164* 121*   Lipid Profile: No results for input(s): CHOL, HDL, LDLCALC, TRIG, CHOLHDL, LDLDIRECT in the last 72 hours.  Thyroid Function Tests: No results for input(s): TSH, T4TOTAL, FREET4, T3FREE, THYROIDAB in the last 72 hours. Anemia Panel: No results for input(s): VITAMINB12, FOLATE, FERRITIN, TIBC, IRON, RETICCTPCT in the last 72 hours. Sepsis Labs: No results for input(s): PROCALCITON, LATICACIDVEN in the last 168 hours.  Recent Results (from the past 240 hour(s))  SARS CORONAVIRUS 2 (TAT 6-24 HRS) Nasopharyngeal Nasopharyngeal Swab     Status: None   Collection Time: 04/29/20  8:26 PM   Specimen: Nasopharyngeal Swab  Result Value Ref Range Status   SARS Coronavirus 2 NEGATIVE NEGATIVE Final    Comment: (NOTE) SARS-CoV-2 target nucleic acids are NOT DETECTED.  The SARS-CoV-2 RNA is generally detectable in upper and lower respiratory specimens during the acute phase of infection. Negative results do not preclude SARS-CoV-2 infection, do not rule out co-infections with other pathogens, and should not be used as the sole basis for treatment or other patient management decisions. Negative results must be combined with clinical observations, patient history, and epidemiological information. The expected result is Negative.  Fact Sheet for Patients: HairSlick.no  Fact Sheet for Healthcare Providers: quierodirigir.com  This test is not yet  approved or cleared by the [place] FDA and  has been authorized for detection and/or diagnosis of SARS-CoV-2 by FDA under an Emergency Use Authorization (EUA). This EUA will remain  in effect (meaning this test can be used) for the duration of the COVID-19 declaration under Se ction 564(b)(1) of the Act, 21 U.S.C. section 360bbb-3(b)(1), unless the authorization is terminated or revoked sooner.  Performed at [facility], [address], [place], [place] [address]   SARS Coronavirus 2 by RT PCR (hospital order, performed in [facility] hospital lab) Nasopharyngeal Nasopharyngeal Swab     Status: None   Collection Time: 04/30/20  2:15 AM   Specimen: Nasopharyngeal Swab  Result Value Ref Range Status   SARS Coronavirus 2 NEGATIVE NEGATIVE Final    Comment: (NOTE) SARS-CoV-2 target nucleic acids are NOT DETECTED.  The SARS-CoV-2 RNA is generally detectable in upper and lower respiratory specimens during the acute phase of infection. The lowest concentration of SARS-CoV-2 viral copies this assay can detect is 250 copies / mL. A negative result does not preclude SARS-CoV-2 infection and should not be used as the sole basis for treatment or other patient management decisions.  A negative result may occur with improper specimen collection / handling, submission of specimen other than nasopharyngeal swab, presence of viral mutation(s) within the areas targeted by this assay, and inadequate number of viral copies (<250 copies / mL). A negative result must be combined with clinical observations, patient history, and epidemiological information.  Fact Sheet for Patients:   BoilerBrush.com.cy  Fact Sheet for Healthcare Providers: https://pope.com/  This test is not yet approved or  cleared by the [place] FDA and has been authorized for detection and/or diagnosis of SARS-CoV-2 by FDA under an Emergency Use Authorization (EUA).   This EUA will remain in effect (meaning this test can be used) for the duration of the COVID-19 declaration under Section 564(b)(1) of the Act, 21 U.S.C. section 360bbb-3(b)(1), unless the authorization is terminated or revoked sooner.  Performed at [facility], [address], [place], [place] [address]          Radiology Studies: IR GASTR TUBE CONVERT GASTR-JEJ PER W/FL MOD SED  Result Date: 04/30/2020 INDICATION: Dysfunctioning malpositioned GJ FEEDING TUBE EXAM: FLUOROSCOPIC REPOSITION/REPLACEMENT OF THE 22 FRENCH GJ FEEDING TUBE MEDICATIONS: NONE. ANESTHESIA/SEDATION: Moderate  Sedation Time:  None. The patient was continuously monitored during the procedure by the interventional radiology nurse under my direct supervision. CONTRAST:  20 cc-administered into the gastric lumen. FLUOROSCOPY TIME:  Fluoroscopy Time: 6 minutes 6 seconds (64 mGy). COMPLICATIONS: None immediate. PROCEDURE: Informed written consent was obtained from the patient's family after a thorough discussion of the procedural risks, benefits and alternatives. All questions were addressed. Maximal Sterile Barrier Technique was utilized including caps, mask, sterile gowns, sterile gloves, sterile drape, hand hygiene and skin antiseptic. A timeout was performed prior to the initiation of the procedure. Existing malpositioned GJ tube coiled in the stomach was removed. Kumpe catheter and stiff Glidewire were utilized to advance the access into the proximal jejunum. Contrast injection confirms position. The 22 French balloon retention GJ tube was advanced with the tip position in the proximal jejunum. Gastrostomy port confirmed stomach. Inflation balloon inflated with 10 cc volume containing 1 cc contrast. Images obtained for documentation. IMPRESSION: Successful exchange/reposition of the 54 French GJ feeding tube. Ready for use. Electronically Signed   By: Judie Petit.  Shick M.D.   On: 04/30/2020 14:14   DG ABDOMEN PEG  TUBE LOCATION  Result Date: 04/29/2020 CLINICAL DATA:  Peg tube replacement EXAM: ABDOMEN - 1 VIEW COMPARISON:  09/24/2019 FINDINGS: Supine frontal view of the abdomen and pelvis excluding the right flank was performed after the clinician injected contrast through the indwelling percutaneous gastrostomy tube. No evidence of contrast extravasation, with injected contrast outlining the rugal folds. Bowel gas pattern is unremarkable. IMPRESSION: 1. Peg tube within the gastric lumen. No evidence of contrast extravasation. Electronically Signed   By: Sharlet Salina M.D.   On: 04/29/2020 19:56        Scheduled Meds: . amLODipine  5 mg Oral Daily  . atorvastatin  10 mg Oral Daily  . B-complex with vitamin C  1 tablet Oral Daily  . [START ON 05/02/2020] calcium-vitamin D  1 tablet Oral Q breakfast  . cycloSPORINE  1 drop Both Eyes BID  . DULoxetine  30 mg Oral Daily  . free water  200 mL Per Tube QID  . gabapentin  300 mg Oral BID  . hydrALAZINE  50 mg Oral TID  . insulin aspart  0-5 Units Subcutaneous QHS  . insulin aspart  0-6 Units Subcutaneous TID WC  . isosorbide mononitrate  60 mg Oral Daily  . latanoprost  1 drop Both Eyes QHS  . levothyroxine  125 mcg Oral Daily  . mometasone-formoterol  2 puff Inhalation BID  . pantoprazole  40 mg Oral Daily  . warfarin  5-7.5 mg Oral See admin instructions   Continuous Infusions: . dextrose 5 % and 0.45% NaCl 75 mL/hr at 05/01/20 0124  . feeding supplement (OSMOLITE 1.5 CAL) Stopped (05/01/20 1009)     LOS: 2 days      Alwyn Ren, MD 05/01/2020, 3:11 PM

## 2020-05-01 NOTE — Progress Notes (Signed)
ANTICOAGULATION CONSULT NOTE - Initial Consult  Pharmacy Consult for Warfarin Indication: atrial fibrillation  Allergies  Allergen Reactions  . Penicillins Itching, Rash and Other (See Comments)    Tolerated amoxicillin, unasyn, zosyn & cephalosporins in the past. Did it involve swelling of the face/tongue/throat, SOB, or low BP? No Did it involve sudden or severe rash/hives, skin peeling, or any reaction on the inside of your mouth or nose? No Did you need to seek medical attention at a hospital or doctor's office? Unknown When did it last happen?5+ years If all above answers are "NO", may proceed with cephalosporin use.     Patient Measurements: Height: 5\' 6"  (167.6 cm) Weight: 60 kg (132 lb 4.4 oz) IBW/kg (Calculated) : 59.3  Vital Signs: Temp: 97.4 F (36.3 C) (01/29 1354) Temp Source: Oral (01/29 1354) BP: 197/73 (01/29 1354) Pulse Rate: 69 (01/29 1354)  Labs: Recent Labs    04/29/20 1827 04/30/20 0538 05/01/20 0635  HGB 11.1* 10.5* 10.5*  HCT 32.8* 31.9* 32.8*  PLT 296 239 223  LABPROT  --   --  28.6*  INR  --   --  2.8*  CREATININE 2.31* 1.88* 1.28*    Estimated Creatinine Clearance: 25.2 mL/min (A) (by C-G formula based on SCr of 1.28 mg/dL (H)).   Medical History: Past Medical History:  Diagnosis Date  . Acute respiratory failure ([place])   . Anemia    previous blood transfusions  . Arthritis    "all over"  . Asthma   . Atrial flutter ([place])   . Bradycardia    requiring previous d/c of BB and reduction of amiodarone  . CAD (coronary artery disease)    nonobstructive per notes  . Chronic diastolic CHF (congestive heart failure) ([place])   . CKD (chronic kidney disease), stage III ([place])   . Complication of blood transfusion    "got the wrong blood type at [facility] in ~ 2015; no adverse reaction that we are aware of"/daughter, [name] (01/27/2016)  . COPD (chronic obstructive pulmonary disease) ([place])   . Depression    "light case"  . DVT (deep  venous thrombosis) ([place]) 01/2016   a. LLE DVT 01/2016 - switched from Eliquis to Coumadin.  [name] Dyspnea    with some activity  . Gastric stenosis    a. s/p stomach tube  . GERD (gastroesophageal reflux disease)   . Headache   . History of 2019 novel coronavirus disease (COVID-19)   . History of blood transfusion    "several" (01/27/2016)  . History of stomach ulcers   . Hyperlipidemia   . Hypertension   . Hypothyroidism   . On home oxygen therapy    "2L; 9PM til 9AM" (06/27/2017)  . PAD (peripheral artery disease) ([place])    a. prior gangrene, toe amputations, intervention.  [name] PAF (paroxysmal atrial fibrillation) ([place])   . Paraesophageal hernia   . Perforated gastric ulcer ([place])   . Pneumonia    "a few times" (06/27/2017)  . Seasonal allergies   . SIADH (syndrome of inappropriate ADH production) ([place])    [name] 01/10/2015  . Small bowel obstruction ([place])    "I don't know how many" (01/11/2015)  . Stroke [facility])    several- no residual  . Type II diabetes mellitus ([place])    "related to prednisone use  for > 20 yr; once predinose stopped; no more DM RX" (01/27/2016)  . UTI (urinary tract infection) 02/08/2016  . Ventral hernia with bowel obstruction     Medications:  Warfarin 5 mg daily except 7.5 mg on Th/Su PTA last dose 1/26  Assessment: 87 y/o F with a h/o atrial fibrillation on warfarin PTA admitted with AKI and dehydration due to malfunctioning PEG tube. INR at 2.8 today with last dose 1/26. Due to prolonged lack of nutrition, patient may be more sensitive to warfarin at this point.   Goal of Therapy:  INR 2-3  Plan:  Hold warfarin today and f/u AM INR due to INR at goal despite no warfarin x 3 days Resume warfarin via tube when appropriate  Ulice Dash D 05/01/2020,3:40 PM

## 2020-05-02 LAB — PROTIME-INR
INR: 2.2 — ABNORMAL HIGH (ref 0.8–1.2)
Prothrombin Time: 23.9 seconds — ABNORMAL HIGH (ref 11.4–15.2)

## 2020-05-02 LAB — GLUCOSE, CAPILLARY
Glucose-Capillary: 202 mg/dL — ABNORMAL HIGH (ref 70–99)
Glucose-Capillary: 90 mg/dL (ref 70–99)

## 2020-05-02 MED ORDER — WARFARIN SODIUM 5 MG PO TABS: 5.0000 mg | ORAL_TABLET | Freq: Once | ORAL | Status: DC

## 2020-05-02 MED ORDER — WARFARIN - PHARMACIST DOSING INPATIENT: Freq: Every day | Status: DC

## 2020-05-02 NOTE — TOC Progression Note (Signed)
Transition of Care [facility]) - Progression Note    Patient Details  Name: Shannon [name] MRN: [id number] Date of Birth: 07/29/1923  [facility] of [facility]) CM/SW Contact  Joaquin Courts, RN Phone Number: 05/02/2020, 9:50 AM  Clinical Narrative:    CM spoke with patient's daughter Shannon [name] who reports patient has a kangaroo pump at home and Osmolite.  Patient's new feeding tube requires a set-up with a tip to be properly attached.  Kangaroo set-up at home does have the tip adaptor per daughter, however patient is in need of flushing syringes with tips (home supply has a leur lock tip).  CM spoke with bedside RN and made arrangements for two extra flushing kits with tipped syringes to be sent home with patient, this will provide enough supply until [employer] can deliver new flushing kits.  Daughter is aware of this plan and in agreement and reports she can pick up patient from hospital at discharge.  Discharge barriers have been resolved, no further CM needs identified.            Expected Discharge Plan and Services           Expected Discharge Date: 05/01/20                                     Social Determinants of Health (SDOH) Interventions    Readmission Risk Interventions No flowsheet data found.

## 2020-05-02 NOTE — Progress Notes (Signed)
ANTICOAGULATION CONSULT NOTE - Initial Consult  Pharmacy Consult for Warfarin Indication: atrial fibrillation  Allergies  Allergen Reactions  . Penicillins Itching, Rash and Other (See Comments)    Tolerated amoxicillin, unasyn, zosyn & cephalosporins in the past. Did it involve swelling of the face/tongue/throat, SOB, or low BP? No Did it involve sudden or severe rash/hives, skin peeling, or any reaction on the inside of your mouth or nose? No Did you need to seek medical attention at a hospital or doctor's office? Unknown When did it last happen?5+ years If all above answers are "NO", may proceed with cephalosporin use.     Patient Measurements: Height: 5\' 6"  (167.6 cm) Weight: 60 kg (132 lb 4.4 oz) IBW/kg (Calculated) : 59.3  Vital Signs: Temp: 98 F (36.7 C) (01/30 0534) Temp Source: Oral (01/30 0534) BP: 144/58 (01/30 0534) Pulse Rate: 81 (01/30 0534)  Labs: Recent Labs    04/29/20 1827 04/30/20 0538 05/01/20 0635 05/02/20 0608  HGB 11.1* 10.5* 10.5*  --   HCT 32.8* 31.9* 32.8*  --   PLT 296 239 223  --   LABPROT  --   --  28.6* 23.9*  INR  --   --  2.8* 2.2*  CREATININE 2.31* 1.88* 1.28*  --     Estimated Creatinine Clearance: 25.2 mL/min (A) (by C-G formula based on SCr of 1.28 mg/dL (H)).   Medications:  Warfarin 5 mg daily except 7.5 mg on Th/Su PTA last dose 1/26  Assessment: 87 y/o F with a h/o atrial fibrillation on warfarin PTA admitted with AKI and dehydration due to malfunctioning PEG tube. INR at 2.8 today with last dose 1/26. Due to prolonged lack of nutrition, patient may be more sensitive to warfarin at this point.    INR down from 2.8 to 2.2 today  No bleeding reported  No drug interactions noted  Tube feedings started 1/28  Goal of Therapy:  INR 2-3  Plan:  Warfarin 5mg  via tube x 1 today Daily PT/INR while inpatient  Peggyann Juba, PharmD, BCPS Pharmacy: [phone] 05/02/2020,12:37 PM

## 2020-05-02 NOTE — Progress Notes (Signed)
Discharge instructions discussed with patient and patients daughter via phone, verbalized agreement and understanding

## 2020-05-02 NOTE — Discharge Summary (Signed)
Physician Discharge Summary  Shannon [name] ZOX:[id number] DOB: 18-Jan-1924 DOA: 04/29/2020  PCP: Sharon Seller, NP  Admit date: 04/29/2020 Discharge date: 05/02/2020  Admitted From:home Disposition:  home Recommendations for Outpatient Follow-up:  1. Follow up with PCP in 1-2 weeks 2. Please obtain CMP/CBC in one week  Home Health:none Equipment/Devices:none Discharge Condition stable CODE STATUS:dnr Diet recommendation:cardiac Brief/Interim Summary:87 y.o.femalewith medical history significant ofsevere dysphagia with a G-tube in place now dislodged, A. fib, hypertension, asthma, COPD, diabetes, hypertension hyperlipidemia who has not had anything diet wise since Thursday of last week. She was in the ER at the time apparently was home afterwards and had a G-tube was dislodged. It fell off completely. They called in IR and were told to reinsert the tube which they did. Following the reinsertion daughter said her nutritional supply was not delivered. They were also on Ensure if the tube was in the right place. For that reason patient did not have any food or drink for a week. She came in with significant dehydration, AKI, weakness. There was reported nausea but no significant vomiting. No fever no chills. Patient being admitted now for failure to thrive, AKI and significant dehydration..  ED Course:Temperature 97.4 blood pressure 114/72 pulse 92 respirate 16 oxygen sat 97% on room air. Sodium 146, glucose 119. Creatinine 2.31 with a baseline 1.26. BUN 74. CBC showed hemoglobin 11.1. INR 1.0. Urinalysis so far negative.   Discharge Diagnoses:  Principal Problem:   AKI (acute kidney injury) (HCC) Active Problems:   Accelerated hypertension   Malnutrition of moderate degree   Dysphagia   Weakness   Atrial fibrillation and flutter (HCC)   CHF (NYHA class IV, ACC/AHA stage D) (HCC)   COPD with asthma (HCC)   #1 AKI secondary to dehydration patient was unable to take  anything through the PEG tube due to malfunctioning PEG tube.  She has history of severe dysphagia. Renal functions improved with IV fluids Creatinine 1.28 from 2.31. She had GJ tube placed by IR   #2 dehydration -treated with ivf  #3 history of COPD and CHF stable  #4 generalized weakness PT consulted no follow up rec  #5 stage II pressure injury to left buttocks see below   #6 hypernatremia-resolved  #7 essential hypertension -continue home meds  #8 history of hypothyroidism on Synthroid at home  #9 history of type 2 diabetes on insulin  Pressure Injury 04/30/20 Buttocks Left;Upper Stage 2 -  Partial thickness loss of dermis presenting as a shallow open injury with a red, pink wound bed without slough. small 1/4cmx1/4cm round pressure wound, pink moist wound bed, no drainage (Active)  04/30/20 0400  Location: Buttocks  Location Orientation: Left;Upper  Staging: Stage 2 -  Partial thickness loss of dermis presenting as a shallow open injury with a red, pink wound bed without slough.  Wound Description (Comments): small 1/4cmx1/4cm round pressure wound, pink moist wound bed, no drainage  Present on Admission: Yes    Nutrition Problem: Moderate Malnutrition Etiology: chronic illness (severe dysphagia)  Signs/Symptoms: moderate fat depletion,moderate muscle depletion  Interventions: Tube feeding  Estimated body mass index is 21.35 kg/m as calculated from the following:   Height as of this encounter: 5\' 6"  (1.676 m).   Weight as of this encounter: 60 kg.  Discharge Instructions  Discharge Instructions    Diet - low sodium heart healthy   Complete by: As directed    Diet - low sodium heart healthy   Complete by: As directed  Discharge wound care:   Complete by: As directed    See orders   Increase activity slowly   Complete by: As directed    Increase activity slowly   Complete by: As directed    Leave dressing on - Keep it clean, dry, and intact until  clinic visit   Complete by: As directed      Allergies as of 05/02/2020      Reactions   Penicillins Itching, Rash, Other (See Comments)   Tolerated amoxicillin, unasyn, zosyn & cephalosporins in the past. Did it involve swelling of the face/tongue/throat, SOB, or low BP? No Did it involve sudden or severe rash/hives, skin peeling, or any reaction on the inside of your mouth or nose? No Did you need to seek medical attention at a hospital or doctor's office? Unknown When did it last happen?5+ years If all above answers are "NO", may proceed with cephalosporin use.      Medication List    STOP taking these medications   acetaminophen 500 MG tablet Commonly known as: TYLENOL   latanoprost 0.005 % ophthalmic solution Commonly known as: XALATAN     TAKE these medications   amLODipine 5 MG tablet Commonly known as: NORVASC Take 1 tablet (5 mg total) by mouth daily.   ARTIFICIAL TEARS OP Place 1 drop into both eyes 4 (four) times daily.   atorvastatin 10 MG tablet Commonly known as: LIPITOR TAKE 1 TABLET BY MOUTH EVERY DAY   b complex vitamins tablet Take 1 tablet by mouth daily.   benzocaine 20 % rectal ointment Commonly known as: AMERICAINE Place rectally every 3 (three) hours as needed for pain.   calcium-vitamin D 500-200 MG-UNIT tablet Commonly known as: OSCAL WITH D Take 1 tablet by mouth daily with breakfast.   cycloSPORINE 0.05 % ophthalmic emulsion Commonly known as: Restasis USE 1 DROP INTO BOTH EYES TWICE DAILY What changed:   how much to take  how to take this  when to take this   Dulera 200-5 MCG/ACT Aero Generic drug: mometasone-formoterol TAKE TWO PUFFS BY MOUTH TWICE DAILY What changed: See the new instructions.   DULoxetine 30 MG capsule Commonly known as: CYMBALTA TAKE 1 CAPSULE BY MOUTH EVERY DAY What changed: how much to take   EQL PROBIOTIC COLON SUPPORT PO Take 1 capsule by mouth daily.   feeding supplement (OSMOLITE 1.5  CAL) Liqd Place 711 mLs into feeding tube See admin instructions. Take 711 ml over a 12 hour period per day   ferrous sulfate 220 (44 Fe) MG/5ML solution TAKE 7.4 MLS (325 MG TOTAL) BY MOUTH DAILY.   fluticasone 50 MCG/ACT nasal spray Commonly known as: FLONASE SPRAY 2 SPRAYS INTO EACH NOSTRIL EVERY DAY What changed: See the new instructions.   free water Soln Place 180 mLs into feeding tube 3 (three) times daily.   furosemide 40 MG tablet Commonly known as: LASIX Take 0.5-1 tablets (20-40 mg total) by mouth See admin instructions. Take 20 mg daily, may instead take 40 mg daily as needed for edema   gabapentin 300 MG capsule Commonly known as: NEURONTIN Take 1 capsule (300 mg total) by mouth 2 (two) times daily.   gentamicin cream 0.1 % Commonly known as: GARAMYCIN Apply 1 application topically daily as needed (dry skin). What changed: reasons to take this   hydrALAZINE 50 MG tablet Commonly known as: APRESOLINE TAKE 1 TABLET BY MOUTH THREE TIMES A DAY   insulin aspart 100 UNIT/ML FlexPen Commonly known as: NOVOLOG Inject into  the skin as needed for high blood sugar. New sliding Scale If BS is 151 - 251 = 2 units,  If BS is 251 -  350 = 4 units,  If BS is 351 - 450 = 6 units Call provider if BS runs more than 400   Insulin Pen Needle 31G X 5 MM Misc Commonly known as: B-D UF III MINI PEN NEEDLES Use twice daily with giving insulin injections. Dx E11.9   ipratropium-albuterol 0.5-2.5 (3) MG/3ML Soln Commonly known as: DUONEB Take 3 mLs by nebulization every 6 (six) hours as needed (shortness of breath).   isosorbide mononitrate 30 MG 24 hr tablet Commonly known as: IMDUR TAKE 2 TABLETS BY MOUTH DAILY. PLEASE MAKE ANNUAL APPT WITH DR. Delton See FOR FUTURE REFILLS What changed: See the new instructions.   Lantus SoloStar 100 UNIT/ML Solostar Pen Generic drug: insulin glargine Inject 8 Units into the skin at bedtime. What changed:   when to take this  reasons to take  this   levalbuterol 45 MCG/ACT inhaler Commonly known as: XOPENEX HFA Inhale 2 puffs into the lungs every 6 (six) hours as needed for wheezing.   levothyroxine 125 MCG tablet Commonly known as: SYNTHROID TAKE 1 TABLET BY MOUTH EVERY DAY What changed: when to take this   loratadine 10 MG tablet Commonly known as: CLARITIN Take 10 mg by mouth daily as needed for allergies.   multivitamin with minerals Tabs tablet Take 1 tablet by mouth daily.   nystatin powder Commonly known as: MYCOSTATIN/NYSTOP Apply 1 application topically as needed (skin irritation).   nystatin cream Commonly known as: MYCOSTATIN Apply 1 application topically 2 (two) times daily.   oxyCODONE 5 MG immediate release tablet Commonly known as: Roxicodone Take 1 tablet (5 mg total) by mouth every 4 (four) hours as needed for severe pain. May take 1/2 tab q4hrs as needed for mild pain. What changed: how much to take   pantoprazole 40 MG tablet Commonly known as: PROTONIX TAKE 1 TABLET BY MOUTH EVERY DAY   polyethylene glycol 17 g packet Commonly known as: MIRALAX / GLYCOLAX Take 17 g by mouth daily as needed for mild constipation.   senna-docusate 8.6-50 MG tablet Commonly known as: Senokot-S Take 1 tablet by mouth as needed for mild constipation.   Systane Nighttime Oint Place 1 application into both eyes at bedtime.   warfarin 2.5 MG tablet Commonly known as: COUMADIN Take as directed. If you are unsure how to take this medication, talk to your nurse or doctor. Original instructions: TAKE 2-3 TABLETS BY MOUTH DAILY AS DIRECTED BY COUMADIN CLINIC. What changed: See the new instructions.            Discharge Care Instructions  (From admission, onward)         Start     Ordered   05/02/20 0000  Discharge wound care:       Comments: See orders   05/02/20 1317   05/01/20 0000  Leave dressing on - Keep it clean, dry, and intact until clinic visit        05/01/20 0906          Follow-up  Information    Sharon Seller, NP Follow up.   Specialty: Geriatric Medicine Contact information: [address] [place] [address] [phone]        Lars Masson, MD .   Specialty: Cardiology Contact information: [address] [place] [address] [phone]  Allergies  Allergen Reactions  . Penicillins Itching, Rash and Other (See Comments)    Tolerated amoxicillin, unasyn, zosyn & cephalosporins in the past. Did it involve swelling of the face/tongue/throat, SOB, or low BP? No Did it involve sudden or severe rash/hives, skin peeling, or any reaction on the inside of your mouth or nose? No Did you need to seek medical attention at a hospital or doctor's office? Unknown When did it last happen?5+ years If all above answers are "NO", may proceed with cephalosporin use.     Consultations:  ir   Procedures/Studies: CT Abdomen Pelvis W Contrast  Result Date: 04/22/2020 CLINICAL DATA:  Abdominal pain.  Diffuse abdominal pain for 2 days EXAM: CT ABDOMEN AND PELVIS WITH CONTRAST TECHNIQUE: Multidetector CT imaging of the abdomen and pelvis was performed using the standard protocol following bolus administration of intravenous contrast. CONTRAST:  80mL OMNIPAQUE IOHEXOL 300 MG/ML  SOLN COMPARISON:  CT 09/24/2019, interventional catheter replacement procedure 01/09/2020 FINDINGS: Lower chest: Lung bases are clear. Hepatobiliary: No focal hepatic lesion. Postcholecystectomy. No biliary dilatation. Several benign hepatic cysts. No biliary duct dilatation. Pancreas: Pancreas is normal. No ductal dilatation. No pancreatic inflammation. Spleen: Normal spleen Adrenals/urinary tract: Adrenal glands and kidneys are normal. The ureters and bladder normal. Stomach/Bowel: Stomach is decompressed. There is a percutaneous gastrostomy tube with jejunal extension. The expanded retention bulb of the gastrostomy tube is within the access tract  at the interface of the subcutaneous tissue and rectus abdominus (image 36/2). The J arm extensions is within the jejunum. No fluid collections in the peritoneal space or subcutaneous tissue. There is no evidence of bowel obstruction. The jejunum and ileum are normal. Colon is normal. There is a a ventral hernia RIGHT of midline which contains a nonobstructed loop of transverse colon. Findings not changed from comparison exam. Rectosigmoid colon normal. Vascular/Lymphatic: Abdominal aorta is normal caliber with atherosclerotic calcification. There is no retroperitoneal or periportal lymphadenopathy. No pelvic lymphadenopathy. Reproductive: Uterus and adnexa unremarkable. Other: No free fluid. Musculoskeletal: Sclerotic lesion in the L2 vertebral body associated endplate favored IMPRESSION: 1. Retention bulb of the percutaneous gastrostomy tube is expanded within the access tract at the junction of the subcutaneous fat and LEFT rectus abdominus muscle. Recommend interventional radiology exchange/repositioning. 2. Jejunal extension of the gastrostomy tube is in good position. No evidence of bowel obstruction. 3. Midline ventral hernia contains a segment of nonobstructed transverse colon. No change from prior. Electronically Signed   By: Genevive Bi M.D.   On: 04/22/2020 16:43   IR GASTR TUBE CONVERT GASTR-JEJ PER W/FL MOD SED  Result Date: 04/30/2020 INDICATION: Dysfunctioning malpositioned GJ FEEDING TUBE EXAM: FLUOROSCOPIC REPOSITION/REPLACEMENT OF THE 22 FRENCH GJ FEEDING TUBE MEDICATIONS: NONE. ANESTHESIA/SEDATION: Moderate Sedation Time:  None. The patient was continuously monitored during the procedure by the interventional radiology nurse under my direct supervision. CONTRAST:  20 cc-administered into the gastric lumen. FLUOROSCOPY TIME:  Fluoroscopy Time: 6 minutes 6 seconds (64 mGy). COMPLICATIONS: None immediate. PROCEDURE: Informed written consent was obtained from the patient's family after a  thorough discussion of the procedural risks, benefits and alternatives. All questions were addressed. Maximal Sterile Barrier Technique was utilized including caps, mask, sterile gowns, sterile gloves, sterile drape, hand hygiene and skin antiseptic. A timeout was performed prior to the initiation of the procedure. Existing malpositioned GJ tube coiled in the stomach was removed. Kumpe catheter and stiff Glidewire were utilized to advance the access into the proximal jejunum. Contrast injection confirms position. The 22 French balloon retention GJ  tube was advanced with the tip position in the proximal jejunum. Gastrostomy port confirmed stomach. Inflation balloon inflated with 10 cc volume containing 1 cc contrast. Images obtained for documentation. IMPRESSION: Successful exchange/reposition of the 29 French GJ feeding tube. Ready for use. Electronically Signed   By: Judie Petit.  Shick M.D.   On: 04/30/2020 14:14   DG ABDOMEN PEG TUBE LOCATION  Result Date: 04/29/2020 CLINICAL DATA:  Peg tube replacement EXAM: ABDOMEN - 1 VIEW COMPARISON:  09/24/2019 FINDINGS: Supine frontal view of the abdomen and pelvis excluding the right flank was performed after the clinician injected contrast through the indwelling percutaneous gastrostomy tube. No evidence of contrast extravasation, with injected contrast outlining the rugal folds. Bowel gas pattern is unremarkable. IMPRESSION: 1. Peg tube within the gastric lumen. No evidence of contrast extravasation. Electronically Signed   By: Sharlet Salina M.D.   On: 04/29/2020 19:56    (Echo, Carotid, EGD, Colonoscopy, ERCP)    Subjective:she is resting in bed anxious to go home  Discharge Exam: Vitals:   05/02/20 0534 05/02/20 0844  BP: (!) 144/58   Pulse: 81   Resp: 18   Temp: 98 F (36.7 C)   SpO2: 100% 98%   Vitals:   05/01/20 1935 05/01/20 2141 05/02/20 0534 05/02/20 0844  BP:  131/75 (!) 144/58   Pulse:  83 81   Resp:  18 18   Temp:  98.3 F (36.8 C) 98 F  (36.7 C)   TempSrc:  Oral Oral   SpO2: 100% 100% 100% 98%  Weight:      Height:        General: Pt is alert, awake, not in acute distress Cardiovascular: RRR, S1/S2 +, no rubs, no gallops Respiratory: CTA bilaterally, no wheezing, no rhonchi Abdominal: Soft, NT, ND, bowel sounds + peg in place Extremities: no edema, no cyanosis    The results of significant diagnostics from this hospitalization (including imaging, microbiology, ancillary and laboratory) are listed below for reference.     Microbiology: Recent Results (from the past 240 hour(s))  SARS CORONAVIRUS 2 (TAT 6-24 HRS) Nasopharyngeal Nasopharyngeal Swab     Status: None   Collection Time: 04/29/20  8:26 PM   Specimen: Nasopharyngeal Swab  Result Value Ref Range Status   SARS Coronavirus 2 NEGATIVE NEGATIVE Final    Comment: (NOTE) SARS-CoV-2 target nucleic acids are NOT DETECTED.  The SARS-CoV-2 RNA is generally detectable in upper and lower respiratory specimens during the acute phase of infection. Negative results do not preclude SARS-CoV-2 infection, do not rule out co-infections with other pathogens, and should not be used as the sole basis for treatment or other patient management decisions. Negative results must be combined with clinical observations, patient history, and epidemiological information. The expected result is Negative.  Fact Sheet for Patients: HairSlick.no  Fact Sheet for Healthcare Providers: quierodirigir.com  This test is not yet approved or cleared by the [place] FDA and  has been authorized for detection and/or diagnosis of SARS-CoV-2 by FDA under an Emergency Use Authorization (EUA). This EUA will remain  in effect (meaning this test can be used) for the duration of the COVID-19 declaration under Se ction 564(b)(1) of the Act, 21 U.S.C. section 360bbb-3(b)(1), unless the authorization is terminated or revoked  sooner.  Performed at [facility], [address] [place], [place] [address]   SARS Coronavirus 2 by RT PCR (hospital order, performed in [facility] hospital lab) Nasopharyngeal Nasopharyngeal Swab     Status: None  Collection Time: 04/30/20  2:15 AM   Specimen: Nasopharyngeal Swab  Result Value Ref Range Status   SARS Coronavirus 2 NEGATIVE NEGATIVE Final    Comment: (NOTE) SARS-CoV-2 target nucleic acids are NOT DETECTED.  The SARS-CoV-2 RNA is generally detectable in upper and lower respiratory specimens during the acute phase of infection. The lowest concentration of SARS-CoV-2 viral copies this assay can detect is 250 copies / mL. A negative result does not preclude SARS-CoV-2 infection and should not be used as the sole basis for treatment or other patient management decisions.  A negative result may occur with improper specimen collection / handling, submission of specimen other than nasopharyngeal swab, presence of viral mutation(s) within the areas targeted by this assay, and inadequate number of viral copies (<250 copies / mL). A negative result must be combined with clinical observations, patient history, and epidemiological information.  Fact Sheet for Patients:   BoilerBrush.com.cy  Fact Sheet for Healthcare Providers: https://pope.com/  This test is not yet approved or  cleared by the [place] FDA and has been authorized for detection and/or diagnosis of SARS-CoV-2 by FDA under an Emergency Use Authorization (EUA).  This EUA will remain in effect (meaning this test can be used) for the duration of the COVID-19 declaration under Section 564(b)(1) of the Act, 21 U.S.C. section 360bbb-3(b)(1), unless the authorization is terminated or revoked sooner.  Performed at [facility], [address], [place], [place] [address]      Labs: BNP (last 3 results) Recent Labs     07/07/19 0128  BNP 242.4*   Basic Metabolic Panel: Recent Labs  Lab 04/29/20 1827 04/30/20 0538 05/01/20 0635  NA 146* 149* 143  K 3.9 3.8 3.7  CL 108 113* 109  CO2 23 23 22   GLUCOSE 119* 109* 164*  BUN 74* 65* 43*  CREATININE 2.31* 1.88* 1.28*  CALCIUM 9.9 9.5 9.0   Liver Function Tests: Recent Labs  Lab 04/29/20 1827 04/30/20 0538 05/01/20 0635  AST 34 31 31  ALT 24 22 20   ALKPHOS 77 73 65  BILITOT 1.1 0.8 0.6  PROT 8.5* 7.9 7.4  ALBUMIN 3.9 3.7 3.2*   No results for input(s): LIPASE, AMYLASE in the last 168 hours. No results for input(s): AMMONIA in the last 168 hours. CBC: Recent Labs  Lab 04/29/20 1827 04/30/20 0538 05/01/20 0635  WBC 5.9 5.6 5.1  HGB 11.1* 10.5* 10.5*  HCT 32.8* 31.9* 32.8*  MCV 95.1 96.4 98.5  PLT 296 239 223   Cardiac Enzymes: No results for input(s): CKTOTAL, CKMB, CKMBINDEX, TROPONINI in the last 168 hours. BNP: Invalid input(s): POCBNP CBG: Recent Labs  Lab 05/01/20 1232 05/01/20 1720 05/01/20 2138 05/02/20 0811 05/02/20 1139  GLUCAP 121* 105* 145* 202* 90   D-Dimer No results for input(s): DDIMER in the last 72 hours. Hgb A1c Recent Labs    04/30/20 0538  HGBA1C 6.2*   Lipid Profile No results for input(s): CHOL, HDL, LDLCALC, TRIG, CHOLHDL, LDLDIRECT in the last 72 hours. Thyroid function studies No results for input(s): TSH, T4TOTAL, T3FREE, THYROIDAB in the last 72 hours.  Invalid input(s): FREET3 Anemia work up No results for input(s): VITAMINB12, FOLATE, FERRITIN, TIBC, IRON, RETICCTPCT in the last 72 hours. Urinalysis    Component Value Date/Time   COLORURINE YELLOW 04/29/2020 2200   APPEARANCEUR HAZY (A) 04/29/2020 2200   LABSPEC 1.012 04/29/2020 2200   PHURINE 6.0 04/29/2020 2200   GLUCOSEU NEGATIVE 04/29/2020 2200   HGBUR NEGATIVE 04/29/2020 2200  BILIRUBINUR NEGATIVE 04/29/2020 2200   BILIRUBINUR Neg 11/01/2017 1228   KETONESUR NEGATIVE 04/29/2020 2200   PROTEINUR NEGATIVE 04/29/2020 2200    UROBILINOGEN 0.2 11/01/2017 1228   UROBILINOGEN 0.2 01/27/2015 2308   NITRITE NEGATIVE 04/29/2020 2200   LEUKOCYTESUR TRACE (A) 04/29/2020 2200   Sepsis Labs Invalid input(s): PROCALCITONIN,  WBC,  LACTICIDVEN Microbiology Recent Results (from the past 240 hour(s))  SARS CORONAVIRUS 2 (TAT 6-24 HRS) Nasopharyngeal Nasopharyngeal Swab     Status: None   Collection Time: 04/29/20  8:26 PM   Specimen: Nasopharyngeal Swab  Result Value Ref Range Status   SARS Coronavirus 2 NEGATIVE NEGATIVE Final    Comment: (NOTE) SARS-CoV-2 target nucleic acids are NOT DETECTED.  The SARS-CoV-2 RNA is generally detectable in upper and lower respiratory specimens during the acute phase of infection. Negative results do not preclude SARS-CoV-2 infection, do not rule out co-infections with other pathogens, and should not be used as the sole basis for treatment or other patient management decisions. Negative results must be combined with clinical observations, patient history, and epidemiological information. The expected result is Negative.  Fact Sheet for Patients: HairSlick.no  Fact Sheet for Healthcare Providers: quierodirigir.com  This test is not yet approved or cleared by the [place] FDA and  has been authorized for detection and/or diagnosis of SARS-CoV-2 by FDA under an Emergency Use Authorization (EUA). This EUA will remain  in effect (meaning this test can be used) for the duration of the COVID-19 declaration under Se ction 564(b)(1) of the Act, 21 U.S.C. section 360bbb-3(b)(1), unless the authorization is terminated or revoked sooner.  Performed at [facility], [address] [place], [place] [address]   SARS Coronavirus 2 by RT PCR (hospital order, performed in [facility] hospital lab) Nasopharyngeal Nasopharyngeal Swab     Status: None   Collection Time: 04/30/20  2:15 AM   Specimen: Nasopharyngeal Swab   Result Value Ref Range Status   SARS Coronavirus 2 NEGATIVE NEGATIVE Final    Comment: (NOTE) SARS-CoV-2 target nucleic acids are NOT DETECTED.  The SARS-CoV-2 RNA is generally detectable in upper and lower respiratory specimens during the acute phase of infection. The lowest concentration of SARS-CoV-2 viral copies this assay can detect is 250 copies / mL. A negative result does not preclude SARS-CoV-2 infection and should not be used as the sole basis for treatment or other patient management decisions.  A negative result may occur with improper specimen collection / handling, submission of specimen other than nasopharyngeal swab, presence of viral mutation(s) within the areas targeted by this assay, and inadequate number of viral copies (<250 copies / mL). A negative result must be combined with clinical observations, patient history, and epidemiological information.  Fact Sheet for Patients:   BoilerBrush.com.cy  Fact Sheet for Healthcare Providers: https://pope.com/  This test is not yet approved or  cleared by the [place] FDA and has been authorized for detection and/or diagnosis of SARS-CoV-2 by FDA under an Emergency Use Authorization (EUA).  This EUA will remain in effect (meaning this test can be used) for the duration of the COVID-19 declaration under Section 564(b)(1) of the Act, 21 U.S.C. section 360bbb-3(b)(1), unless the authorization is terminated or revoked sooner.  Performed at [facility], [address] [place], [place] [address]      Time coordinating discharge:  39 minutes  SIGNED:   Alwyn Ren, MD  [facility] 05/02/2020, 2:52 PM

## 2020-05-03 ENCOUNTER — Telehealth: Payer: Self-pay | Admitting: *Deleted

## 2020-05-03 ENCOUNTER — Other Ambulatory Visit: Payer: Self-pay | Admitting: *Deleted

## 2020-05-03 NOTE — Telephone Encounter (Signed)
Transition Care Management Follow-up Telephone Call  Date of discharge and from where: 05/02/2020 - [facility]  How have you been since you were released from the hospital? "Still feel a little weak."  Any questions or concerns? No  Items Reviewed:  Did the pt receive and understand the discharge instructions provided? Yes   Medications obtained and verified? Yes   Other? No   Any new allergies since your discharge? No   Dietary orders reviewed? Yes  Do you have support at home? Yes   Home Care and Equipment/Supplies: Were home health services ordered? not applicable If so, what is the name of the agency? N/A  Has the agency set up a time to come to the patient's home? not applicable Were any new equipment or medical supplies ordered?  No What is the name of the medical supply agency? N/A Were you able to get the supplies/equipment? not applicable Do you have any questions related to the use of the equipment or supplies? No  Functional Questionnaire: (I = Independent and D = Dependent) ADLs: I  Bathing/Dressing- D  Meal Prep- D  Eating- I  Maintaining continence- D  Transferring/Ambulation- D  Managing Meds- D  Follow up appointments reviewed:   PCP Hospital f/u appt confirmed? Yes  Scheduled to see Sherrie Mustache, NP on 06/09/2020 @ 1300.  [facility] f/u appt confirmed? Yes  Scheduled to see Cardiology on 06/22/2020 @ 1120.  Are transportation arrangements needed? No   If their condition worsens, is the pt aware to call PCP or go to the Emergency Dept.? Yes  Was the patient provided with contact information for the PCP's office or ED? Yes  Was to pt encouraged to call back with questions or concerns? Yes

## 2020-05-03 NOTE — Patient Outreach (Signed)
[facility]) Care Management  05/03/2020  Shannon [name] 03-27-1924 [id number]   Noted that member was admitted to hospital 1/27-1/30 due to issues with feeding tube and dehydration, feeding tube was replaced.  Call placed to member to follow up on discharge, no answer, unable to leave message.  Will follow up within the next 3-4 business days.  [name], [place], MSN [place] [phone]

## 2020-05-04 ENCOUNTER — Other Ambulatory Visit: Payer: Self-pay | Admitting: *Deleted

## 2020-05-04 ENCOUNTER — Other Ambulatory Visit: Payer: Self-pay

## 2020-05-04 ENCOUNTER — Ambulatory Visit (INDEPENDENT_AMBULATORY_CARE_PROVIDER_SITE_OTHER): Payer: Medicare Other | Admitting: *Deleted

## 2020-05-04 DIAGNOSIS — I4892 Unspecified atrial flutter: Secondary | ICD-10-CM | POA: Diagnosis not present

## 2020-05-04 DIAGNOSIS — I4891 Unspecified atrial fibrillation: Secondary | ICD-10-CM

## 2020-05-04 DIAGNOSIS — Z5181 Encounter for therapeutic drug level monitoring: Secondary | ICD-10-CM

## 2020-05-04 LAB — POCT INR: INR: 1.4 — AB (ref 2.0–3.0)

## 2020-05-04 NOTE — Patient Outreach (Signed)
[facility]) Care Management  05/04/2020  Shannon [name] 1924-01-23 [id number]   Voice message received from daughter asking to reschedule telephone outreach for 2/4.  State PCP office would like to schedule office visit for the same day/time.  Call placed, advised daughter that outreach has been canceled and PCP is able to schedule office visit.  She will call office back, will have member call this care manager back once they are back home from coumadin clinic.  If no call back, will follow up within the next week.  [name], [place], MSN [place] [phone]

## 2020-05-04 NOTE — Patient Instructions (Signed)
Description   Take 3 tablets today and tomorrow, then continue taking warfarin 2 tablets daily except 3 tablets on Sundays and Thursdays. Recheck in 10 days.  Call Coumadin Clinic with any new medications [phone].

## 2020-05-04 NOTE — Telephone Encounter (Signed)
Patient has a TOC Appointment with Janett Billow 05/07/2020

## 2020-05-05 ENCOUNTER — Ambulatory Visit (INDEPENDENT_AMBULATORY_CARE_PROVIDER_SITE_OTHER): Payer: Medicare Other | Admitting: Podiatry

## 2020-05-05 ENCOUNTER — Other Ambulatory Visit: Payer: Self-pay | Admitting: Nurse Practitioner

## 2020-05-05 ENCOUNTER — Other Ambulatory Visit: Payer: Self-pay

## 2020-05-05 ENCOUNTER — Other Ambulatory Visit: Payer: Self-pay | Admitting: Podiatry

## 2020-05-05 DIAGNOSIS — E0859 Diabetes mellitus due to underlying condition with other circulatory complications: Secondary | ICD-10-CM

## 2020-05-05 DIAGNOSIS — L97512 Non-pressure chronic ulcer of other part of right foot with fat layer exposed: Secondary | ICD-10-CM

## 2020-05-05 DIAGNOSIS — Z9889 Other specified postprocedural states: Secondary | ICD-10-CM | POA: Diagnosis not present

## 2020-05-05 DIAGNOSIS — E1142 Type 2 diabetes mellitus with diabetic polyneuropathy: Secondary | ICD-10-CM

## 2020-05-05 DIAGNOSIS — F331 Major depressive disorder, recurrent, moderate: Secondary | ICD-10-CM

## 2020-05-05 DIAGNOSIS — I739 Peripheral vascular disease, unspecified: Secondary | ICD-10-CM

## 2020-05-05 NOTE — Addendum Note (Signed)
Addended by: Lind Guest on: 05/05/2020 01:50 PM   Modules accepted: Orders

## 2020-05-05 NOTE — Progress Notes (Signed)
Subjective:  Patient presents today status post transmetatarsal amputation of the right foot. DOS: 11/14/2019.  Patient continues to experience some pain and tenderness to the lateral aspect of the right forefoot.  She has been dressing the foot with antibiotic ointment and wearing the surgical shoe as directed.  No new complaints at this time  Past Medical History:  Diagnosis Date  . Acute respiratory failure ([place])   . Anemia    previous blood transfusions  . Arthritis    "all over"  . Asthma   . Atrial flutter ([place])   . Bradycardia    requiring previous d/c of BB and reduction of amiodarone  . CAD (coronary artery disease)    nonobstructive per notes  . Chronic diastolic CHF (congestive heart failure) ([place])   . CKD (chronic kidney disease), stage III ([place])   . Complication of blood transfusion    "got the wrong blood type at [facility] in ~ 2015; no adverse reaction that we are aware of"/daughter, [name] (01/27/2016)  . COPD (chronic obstructive pulmonary disease) ([place])   . Depression    "light case"  . DVT (deep venous thrombosis) ([place]) 01/2016   a. LLE DVT 01/2016 - switched from Eliquis to Coumadin.  [name] Dyspnea    with some activity  . Gastric stenosis    a. s/p stomach tube  . GERD (gastroesophageal reflux disease)   . Headache   . History of 2019 novel coronavirus disease (COVID-19)   . History of blood transfusion    "several" (01/27/2016)  . History of stomach ulcers   . Hyperlipidemia   . Hypertension   . Hypothyroidism   . On home oxygen therapy    "2L; 9PM til 9AM" (06/27/2017)  . PAD (peripheral artery disease) ([place])    a. prior gangrene, toe amputations, intervention.  [name] PAF (paroxysmal atrial fibrillation) ([place])   . Paraesophageal hernia   . Perforated gastric ulcer ([place])   . Pneumonia    "a few times" (06/27/2017)  . Seasonal allergies   . SIADH (syndrome of inappropriate ADH production) ([place])    [name] 01/10/2015  . Small bowel obstruction ([place])    "I  don't know how many" (01/11/2015)  . Stroke [facility])    several- no residual  . Type II diabetes mellitus ([place])    "related to prednisone use  for > 20 yr; once predinose stopped; no more DM RX" (01/27/2016)  . UTI (urinary tract infection) 02/08/2016  . Ventral hernia with bowel obstruction     Objective: Physical Exam General: The patient is alert and oriented x3 in no acute distress.  Dermatology: Wound noted to the distal lateral aspect of the right forefoot stump measuring approximately 0.6 x 0.6 x 0.2 cm.  To the noted ulceration there is no eschar.  There is a moderate amount of slough fibrin and necrotic tissue noted.  Granulation tissue and wound base is red.  There is no exposed bone muscle tendon ligament or joint.  No malodor noted.  Periwound integrity is callused.   There is also some preulcerative callus tissue noted to the left foot bunion.  Occasionally this needs to be trimmed.  Today she is requesting to have it trimmed.  Vascular: Palpable pedal pulses bilaterally.  Negative for any significant erythema or edema to the leg.  Neurological: Epicritic and protective threshold diminished bilaterally.   Musculoskeletal Exam: Status post TMA right foot.  Hallux valgus left.  Second toe amputation left.  Assessment: 1. s/p transmetatarsal amputation  right foot. DOS: 11/14/2019 2.  Ulcer right lateral forefoot amputation stump 3.    Plan of Care:  1. Patient was evaluated.  2.  Medically necessary excisional debridement including subcutaneous tissue was performed using a tissue nipper with post debridement measurement same as pre-.  Excisional debridement of all the necrotic nonviable tissue down to the healthy bleeding viable tissue was performed 3.  Continue Betadine daily and Prisma collagen dressing 4.  Continue wearing diabetic shoes and insoles 5.  Excisional debridement of the preulcerative callus tissue overlying the bunion of the left foot was performed using a chisel  blade without incident or bleeding.   6.  Cultures taken of the distal lateral aspect of the right foot wound and sent to pathology for culture and sensitivity  7.  Return to clinic in 6 weeks   Edrick Kins, DPM [facility]  Dr. Edrick Kins, DPM    [address], [place] [address]                Office [phone]  Fax [phone]

## 2020-05-07 ENCOUNTER — Other Ambulatory Visit: Payer: Self-pay | Admitting: Physician Assistant

## 2020-05-07 ENCOUNTER — Other Ambulatory Visit: Payer: Self-pay | Admitting: Nurse Practitioner

## 2020-05-07 ENCOUNTER — Other Ambulatory Visit: Payer: Self-pay | Admitting: Adult Health

## 2020-05-07 ENCOUNTER — Ambulatory Visit (INDEPENDENT_AMBULATORY_CARE_PROVIDER_SITE_OTHER): Payer: Medicare Other | Admitting: Nurse Practitioner

## 2020-05-07 ENCOUNTER — Other Ambulatory Visit: Payer: Self-pay

## 2020-05-07 ENCOUNTER — Encounter: Payer: Self-pay | Admitting: Nurse Practitioner

## 2020-05-07 ENCOUNTER — Ambulatory Visit: Payer: Medicare Other | Admitting: *Deleted

## 2020-05-07 VITALS — BP 160/70 | HR 72 | Temp 97.7°F | Ht 66.0 in | Wt 126.6 lb

## 2020-05-07 DIAGNOSIS — N179 Acute kidney failure, unspecified: Secondary | ICD-10-CM | POA: Diagnosis not present

## 2020-05-07 DIAGNOSIS — B370 Candidal stomatitis: Secondary | ICD-10-CM

## 2020-05-07 DIAGNOSIS — R634 Abnormal weight loss: Secondary | ICD-10-CM | POA: Diagnosis not present

## 2020-05-07 DIAGNOSIS — R531 Weakness: Secondary | ICD-10-CM

## 2020-05-07 DIAGNOSIS — J449 Chronic obstructive pulmonary disease, unspecified: Secondary | ICD-10-CM

## 2020-05-07 DIAGNOSIS — D638 Anemia in other chronic diseases classified elsewhere: Secondary | ICD-10-CM | POA: Diagnosis not present

## 2020-05-07 DIAGNOSIS — Z931 Gastrostomy status: Secondary | ICD-10-CM | POA: Diagnosis not present

## 2020-05-07 DIAGNOSIS — E440 Moderate protein-calorie malnutrition: Secondary | ICD-10-CM

## 2020-05-07 MED ORDER — NYSTATIN 100000 UNIT/ML MT SUSP: 5.0000 mL | Freq: Four times a day (QID) | OROMUCOSAL | 0 refills | Status: DC

## 2020-05-07 NOTE — Progress Notes (Signed)
Careteam: Patient Care Team: [name], NP as PCP - General (Geriatric Medicine) Dorothy Spark, MD as PCP - Cardiology (Cardiology) Edrick Kins, DPM as Consulting Physician (Podiatry) Dorothy Spark, MD as Consulting Physician (Cardiology) [name], NP as Nurse Practitioner (Geriatric Medicine) Camillo Flaming, [place] as Referring Physician (Optometry) [name], RN as [place] Management  PLACE OF SERVICE:  [place] Directive information Does Patient Have a Medical Advance Directive?: Yes, Type of Advance Directive: Healthcare Power of Attorney, Does patient want to make changes to medical advance directive?: No - Patient declined  Allergies  Allergen Reactions  . Penicillins Itching, Rash and Other (See Comments)    Tolerated amoxicillin, unasyn, zosyn & cephalosporins in the past. Did it involve swelling of the face/tongue/throat, SOB, or low BP? No Did it involve sudden or severe rash/hives, skin peeling, or any reaction on the inside of your mouth or nose? No Did you need to seek medical attention at a hospital or doctor's office? Unknown When did it last happen?5+ years If all above answers are "NO", may proceed with cephalosporin use.     Chief Complaint  Patient presents with  . [facility] follow up visit. Discuss need for COVID booster. Patient states she's not feeling her best today. Her mouth and her back are bothering her.Daughter would like to know why patient was told to stop taking Tylenol and stop using Latanoprost. Patient also has questions about tube.Patient would like mouth looked at.     HPI: [name] to follow up hospitalization. Had a horrible experience at the hospital.  Pt with severe dysphagia and G tube dislodged. she had not had good oral intake for 1 week therefore went to the ED. Pt with hx of A. fib, hypertension, asthma, COPD, diabetes,  hypertension hyperlipidemia, DM. Found to have severe dehydration with AKI. She was rehydrated and G tube was reinserted during hospitalization.  They placed a new tube temporally place and plans to go next week to get it changed out to the one that she will have permanently (it was not in stock).  Noted to have left upper buttock pressure ulcer during hospitalization.   Tylenol was removed off her medication list but there was no indications for this. Daughter has resumed.    Mouth and tongue is red and sore.   htn- generally sbp in the 110-130s/60s sbp was 141 this morning.   Did have 240 of water but daughter doing 300 cc of water 3 times daily due to not drinking much.    Review of Systems:  Review of Systems  Constitutional: Negative for chills, fever and weight loss.  HENT:       Mouth pain  Respiratory: Negative for cough, sputum production and shortness of breath.   Cardiovascular: Negative for chest pain, palpitations and leg swelling.  Gastrointestinal: Negative for abdominal pain, constipation, diarrhea and heartburn.  Genitourinary: Negative for dysuria, frequency and urgency.  Musculoskeletal: Negative for back pain, falls, joint pain and myalgias.  Skin: Negative.   Neurological: Negative for dizziness and headaches.  Psychiatric/Behavioral: Negative for depression and memory loss. The patient does not have insomnia.     Past Medical History:  Diagnosis Date  . Acute respiratory failure ([place])   . Anemia    previous blood transfusions  . Arthritis    "all over"  . Asthma   . Atrial flutter ([place])   .  Bradycardia    requiring previous d/c of BB and reduction of amiodarone  . CAD (coronary artery disease)    nonobstructive per notes  . Chronic diastolic CHF (congestive heart failure) ([place])   . CKD (chronic kidney disease), stage III ([place])   . Complication of blood transfusion    "got the wrong blood type at [facility] in ~ 2015; no adverse reaction that we are  aware of"/daughter, [name] (01/27/2016)  . COPD (chronic obstructive pulmonary disease) ([place])   . Depression    "light case"  . DVT (deep venous thrombosis) ([place]) 01/2016   a. LLE DVT 01/2016 - switched from Eliquis to Coumadin.  [name] Dyspnea    with some activity  . Gastric stenosis    a. s/p stomach tube  . GERD (gastroesophageal reflux disease)   . Headache   . History of 2019 novel coronavirus disease (COVID-19)   . History of blood transfusion    "several" (01/27/2016)  . History of stomach ulcers   . Hyperlipidemia   . Hypertension   . Hypothyroidism   . On home oxygen therapy    "2L; 9PM til 9AM" (06/27/2017)  . PAD (peripheral artery disease) ([place])    a. prior gangrene, toe amputations, intervention.  [name] PAF (paroxysmal atrial fibrillation) ([place])   . Paraesophageal hernia   . Perforated gastric ulcer ([place])   . Pneumonia    "a few times" (06/27/2017)  . Seasonal allergies   . SIADH (syndrome of inappropriate ADH production) ([place])    [name] 01/10/2015  . Small bowel obstruction ([place])    "I don't know how many" (01/11/2015)  . Stroke [facility])    several- no residual  . Type II diabetes mellitus ([employer])    "related to prednisone use  for > 20 yr; once predinose stopped; no more DM RX" (01/27/2016)  . UTI (urinary tract infection) 02/08/2016  . Ventral hernia with bowel obstruction    Past Surgical History:  Procedure Laterality Date  . ABDOMINAL AORTOGRAM N/A 09/21/2016   Procedure: Abdominal Aortogram;  Surgeon: Waynetta Sandy, MD;  Location: [place] [facility];  Service: Cardiovascular;  Laterality: N/A;  . AMPUTATION Left 09/25/2016   Procedure: AMPUTATION DIGIT- LEFT 2ND AND 3RD TOES;  Surgeon: Rosetta Posner, MD;  Location: [place];  Service: Vascular;  Laterality: Left;  . AMPUTATION Right 05/03/2018   Procedure: AMPUTATION RIGHT GREAT TOE;  Surgeon: Rosetta Posner, MD;  Location: [place];  Service: Vascular;  Laterality: Right;  . CATARACT EXTRACTION W/ INTRAOCULAR  LENS  IMPLANT, BILATERAL    . CHOLECYSTECTOMY OPEN    . COLECTOMY    . ESOPHAGOGASTRODUODENOSCOPY N/A 01/19/2014   Procedure: ESOPHAGOGASTRODUODENOSCOPY (EGD);  Surgeon: Irene Shipper, MD;  [facility];  Service: Endoscopy;  Laterality: N/A;  . ESOPHAGOGASTRODUODENOSCOPY N/A 01/20/2014   Procedure: ESOPHAGOGASTRODUODENOSCOPY (EGD);  Surgeon: Irene Shipper, MD;  Location[facility];  Service: Endoscopy;  Laterality: N/A;  . ESOPHAGOGASTRODUODENOSCOPY N/A 03/19/2014   Procedure: ESOPHAGOGASTRODUODENOSCOPY (EGD);  Surgeon: Milus Banister, MD;  Location: [facility];  Service: Endoscopy;  Laterality: N/A;  . ESOPHAGOGASTRODUODENOSCOPY N/A 07/08/2015   Procedure: ESOPHAGOGASTRODUODENOSCOPY (EGD);  Surgeon: Doran Stabler, MD;  Location[facility] END[facility];  Service: Endoscopy;  Laterality: N/A;  . ESOPHAGOGASTRODUODENOSCOPY (EGD) WITH PROPOFOL N/A 09/15/2015   Procedure: ESOPHAGOGASTRODUODENOSCOPY (EGD) WITH PROPOFOL;  Surgeon: Manus Gunning, MD;  [facility];  Service: Gastroenterology;  Laterality: N/A;  . GASTROJEJUNOSTOMY     hx/notes 01/10/2015  . GASTROJEJUNOSTOMY N/A  09/23/2015   Procedure: OPEN GASTROJEJUNOSTOMY TUBE PLACEMENT;  Surgeon: Arta Bruce Kinsinger, MD;  [facility];  Service: General;  Laterality: N/A;  . GLAUCOMA SURGERY Bilateral   . HERNIA REPAIR  2015  . IR CM INJ ANY COLONIC TUBE W/FLUORO  01/05/2017  . IR CM INJ ANY COLONIC TUBE W/FLUORO  06/07/2017  . IR CM INJ ANY COLONIC TUBE W/FLUORO  11/05/2017  . IR CM INJ ANY COLONIC TUBE W/FLUORO  09/18/2018  . IR CM INJ ANY COLONIC TUBE W/FLUORO  03/06/2019  . IR CM INJ ANY COLONIC TUBE W/FLUORO  03/11/2019  . IR GASTR TUBE CONVERT GASTR-JEJ PER W/FL MOD SED  04/30/2020  . IR GENERIC HISTORICAL  01/07/2016   IR GJ TUBE CHANGE 01/07/2016 Jacqulynn Cadet, MD [facility]  . IR GENERIC HISTORICAL  01/27/2016   IR MECH REMOV OBSTRUC MAT ANY COLON TUBE W/FLUORO 01/27/2016 Markus Daft, MD [facility]  . IR  GENERIC HISTORICAL  02/07/2016   IR PATIENT EVAL TECH 0-60 MINS Darrell K Allred, PA-C [facility]  . IR GENERIC HISTORICAL  02/08/2016   IR GJ TUBE CHANGE 02/08/2016 Greggory Keen, MD [facility]  . IR GENERIC HISTORICAL  01/06/2016   IR GJ TUBE CHANGE 01/06/2016 [facility] OUT REF  . IR GENERIC HISTORICAL  05/02/2016   IR CM INJ ANY COLONIC TUBE W/FLUORO 05/02/2016 Arne Cleveland, MD [facility]  . IR GENERIC HISTORICAL  05/15/2016   IR GJ TUBE CHANGE 05/15/2016 Sandi Mariscal, MD [facility]  . IR GENERIC HISTORICAL  06/28/2016   IR GJ TUBE CHANGE 06/28/2016 [facility]  . IR GJ TUBE CHANGE  02/20/2017  . IR GJ TUBE CHANGE  05/10/2017  . IR GJ TUBE CHANGE  07/06/2017  . IR GJ TUBE CHANGE  08/02/2017  . IR GJ TUBE CHANGE  10/15/2017  . IR GJ TUBE CHANGE  12/26/2017  . IR [place] TUBE CHANGE  04/08/2018  . IR [place] TUBE CHANGE  06/19/2018  . IR [place] TUBE CHANGE  03/03/2019  . IR [place] TUBE CHANGE  06/06/2019  . IR GJ TUBE CHANGE  09/26/2019  . IR GJ TUBE CHANGE  01/09/2020  . IR PATIENT EVAL TECH 0-60 MINS  10/19/2016  . IR PATIENT EVAL TECH 0-60 MINS  12/25/2016  . IR PATIENT EVAL TECH 0-60 MINS  01/29/2017  . IR PATIENT EVAL TECH 0-60 MINS  04/04/2017  . IR PATIENT EVAL TECH 0-60 MINS  04/30/2017  . IR PATIENT EVAL TECH 0-60 MINS  07/31/2017  . IR REPLC DUODEN/JEJUNO TUBE PERCUT W/FLUORO  11/14/2016  . LAPAROTOMY N/A 01/20/2015   Procedure: EXPLORATORY LAPAROTOMY;  Surgeon: Coralie Keens, MD;  Location: [place];  Service: General;  Laterality: N/A;  . LOWER EXTREMITY ANGIOGRAPHY Left 09/21/2016   Procedure: Lower Extremity Angiography;  Surgeon: Waynetta Sandy, MD;  Location: [place] [facility];  Service: Cardiovascular;  Laterality: Left;  . LOWER EXTREMITY ANGIOGRAPHY Right 06/27/2017   Procedure: Lower Extremity Angiography;  Surgeon: Waynetta Sandy, MD;  Location: [place] [facility];  Service: Cardiovascular;  Laterality: Right;  . LYSIS OF ADHESION N/A 01/20/2015   Procedure: LYSIS OF  ADHESIONS < 1 HOUR;  Surgeon: Coralie Keens, MD;  Location: [place];  Service: General;  Laterality: N/A;  . PERIPHERAL VASCULAR BALLOON ANGIOPLASTY Left 09/21/2016   Procedure: Peripheral Vascular Balloon Angioplasty;  Surgeon: Waynetta Sandy, MD;  Location: [place] [facility];  Service: Cardiovascular;  Laterality: Left;  drug coated balloon  . PERIPHERAL VASCULAR BALLOON  ANGIOPLASTY Right 06/27/2017   Procedure: PERIPHERAL VASCULAR BALLOON ANGIOPLASTY;  Surgeon: Waynetta Sandy, MD;  Location: [place] [facility];  Service: Cardiovascular;  Laterality: Right;  SFA/TPTRUNK  . TONSILLECTOMY    . TRANSMETATARSAL AMPUTATION Right 11/14/2019   Procedure: TRANSMETATARSAL AMPUTATION RIGHT FOOT;  Surgeon: Edrick Kins, DPM;  Location: [place];  Service: Podiatry;  Laterality: Right;  . TUBAL LIGATION    . VENTRAL HERNIA REPAIR  2015   incarcerated ventral hernia ([facility] 09/2013)/notes 01/10/2015   Social History:   reports that she has never smoked. She quit smokeless tobacco use about 40 years ago.  Her smokeless tobacco use included snuff. She reports that she does not drink alcohol and does not use drugs.  Family History  Problem Relation Age of Onset  . Stroke Mother   . Hypertension Mother   . Diabetes Brother   . Glaucoma Son   . Glaucoma Son   . Heart attack Neg Hx   . Colon cancer Neg Hx   . Stomach cancer Neg Hx     Medications: Patient's Medications  New Prescriptions   NYSTATIN (MYCOSTATIN) 100000 UNIT/ML SUSPENSION    Take 5 mLs (500,000 Units total) by mouth 4 (four) times daily.  Previous Medications   ACETAMINOPHEN (TYLENOL) 500 MG TABLET    Take 500 mg by mouth every 6 (six) hours as needed.   AMLODIPINE (NORVASC) 5 MG TABLET    Take 1 tablet (5 mg total) by mouth daily.   ATORVASTATIN (LIPITOR) 10 MG TABLET    TAKE 1 TABLET BY MOUTH EVERY DAY   B COMPLEX VITAMINS TABLET    Take 1 tablet by mouth daily.    BENZOCAINE (AMERICAINE) 20 % RECTAL OINTMENT     Place rectally every 3 (three) hours as needed for pain.   CALCIUM-VITAMIN D (OSCAL WITH D) 500-200 MG-UNIT PER TABLET    Take 1 tablet by mouth daily with breakfast.    CARBOXYMETHYLCELLULOSE SODIUM (ARTIFICIAL TEARS OP)    Place 1 drop into both eyes 4 (four) times daily.   CYCLOSPORINE (RESTASIS) 0.05 % OPHTHALMIC EMULSION    USE 1 DROP INTO BOTH EYES TWICE DAILY   DULERA 200-5 MCG/ACT AERO    TAKE TWO PUFFS BY MOUTH TWICE DAILY   DULOXETINE (CYMBALTA) 30 MG CAPSULE    TAKE 1 CAPSULE BY MOUTH EVERY DAY   FERROUS SULFATE 220 (44 FE) MG/5ML SOLUTION    TAKE 7.4 MLS (325 MG TOTAL) BY MOUTH DAILY.   FLUTICASONE (FLONASE) 50 MCG/ACT NASAL SPRAY    SPRAY 2 SPRAYS INTO EACH NOSTRIL EVERY DAY   FUROSEMIDE (LASIX) 40 MG TABLET    Take 0.5-1 tablets (20-40 mg total) by mouth See admin instructions. Take 20 mg daily, may instead take 40 mg daily as needed for edema   GABAPENTIN (NEURONTIN) 300 MG CAPSULE    Take 1 capsule (300 mg total) by mouth 2 (two) times daily.   GENTAMICIN CREAM (GARAMYCIN) 0.1 %    Apply 1 application topically daily as needed (dry skin).   HYDRALAZINE (APRESOLINE) 50 MG TABLET    TAKE 1 TABLET BY MOUTH THREE TIMES A DAY   INSULIN ASPART (NOVOLOG) 100 UNIT/ML FLEXPEN    Inject into the skin as needed for high blood sugar. New sliding Scale If BS is 151 - 251 = 2 units,  If BS is 251 -  350 = 4 units,  If BS is 351 - 450 = 6 units Call provider if BS runs more  than 400   INSULIN GLARGINE (LANTUS SOLOSTAR) 100 UNIT/ML SOLOSTAR PEN    Inject 8 Units into the skin at bedtime.   INSULIN PEN NEEDLE (B-D UF III MINI PEN NEEDLES) 31G X 5 MM MISC    Use twice daily with giving insulin injections. Dx E11.9   IPRATROPIUM-ALBUTEROL (DUONEB) 0.5-2.5 (3) MG/3ML SOLN    Take 3 mLs by nebulization every 6 (six) hours as needed (shortness of breath).   ISOSORBIDE MONONITRATE (IMDUR) 30 MG 24 HR TABLET    TAKE 2 TABLETS BY MOUTH DAILY. PLEASE MAKE ANNUAL APPT WITH DR. Meda Coffee FOR FUTURE REFILLS    LATANOPROST (XALATAN) 0.005 % OPHTHALMIC SOLUTION    1 drop at bedtime.   LEVALBUTEROL (XOPENEX HFA) 45 MCG/ACT INHALER    Inhale 2 puffs into the lungs every 6 (six) hours as needed for wheezing.   LEVOTHYROXINE (SYNTHROID) 125 MCG TABLET    TAKE 1 TABLET BY MOUTH EVERY DAY   LORATADINE (CLARITIN) 10 MG TABLET    Take 10 mg by mouth daily as needed for allergies.   MULTIPLE VITAMIN (MULTIVITAMIN WITH MINERALS) TABS TABLET    Take 1 tablet by mouth daily.   NUTRITIONAL SUPPLEMENTS (FEEDING SUPPLEMENT, OSMOLITE 1.5 CAL,) LIQD    Place 711 mLs into feeding tube See admin instructions. Take 711 ml over a 12 hour period per day   NYSTATIN (MYCOSTATIN/NYSTOP) POWDER    Apply 1 application topically as needed (skin irritation).   NYSTATIN CREAM (MYCOSTATIN)    Apply 1 application topically 2 (two) times daily.   OXYCODONE (ROXICODONE) 5 MG IMMEDIATE RELEASE TABLET    Take 1 tablet (5 mg total) by mouth every 4 (four) hours as needed for severe pain. May take 1/2 tab q4hrs as needed for mild pain.   PANTOPRAZOLE (PROTONIX) 40 MG TABLET    TAKE 1 TABLET BY MOUTH EVERY DAY   POLYETHYLENE GLYCOL (MIRALAX / GLYCOLAX) 17 G PACKET    Take 17 g by mouth daily as needed for mild constipation.   PROBIOTIC PRODUCT (EQL PROBIOTIC COLON SUPPORT PO)    Take 1 capsule by mouth daily.   SENNA-DOCUSATE (SENOKOT-S) 8.6-50 MG TABLET    Take 1 tablet by mouth as needed for mild constipation.   WARFARIN (COUMADIN) 2.5 MG TABLET    TAKE 2-3 TABLETS BY MOUTH DAILY AS DIRECTED BY COUMADIN CLINIC.   WATER FOR IRRIGATION, STERILE (FREE WATER) SOLN    Place 180 mLs into feeding tube 3 (three) times daily.    WHITE PETROLATUM-MINERAL OIL (SYSTANE NIGHTTIME) OINT    Place 1 application into both eyes at bedtime.   Modified Medications   No medications on file  Discontinued Medications   No medications on file    Physical Exam:  Vitals:   05/07/20 1357  BP: (!) 160/70  Pulse: 72  Temp: 97.7 F (36.5 C)  TempSrc: Temporal   SpO2: 99%  Weight: 126 lb 9.6 oz (57.4 kg)  Height: 5\' 6"  (1.676 m)   Body mass index is 20.43 kg/m. Wt Readings from Last 3 Encounters:  05/07/20 126 lb 9.6 oz (57.4 kg)  05/01/20 132 lb 4.4 oz (60 kg)  01/21/20 136 lb (61.7 kg)    Physical Exam Constitutional:      General: She is not in acute distress.    Appearance: She is well-developed and well-nourished. She is not diaphoretic.  HENT:     Head: Normocephalic and atraumatic.     Mouth/Throat:     Mouth: Oropharynx is clear  and moist. Mucous membranes are moist.     Comments: Tongue with white coating  Eyes:     Conjunctiva/sclera: Conjunctivae normal.     Pupils: Pupils are equal, round, and reactive to light.  Cardiovascular:     Rate and Rhythm: Normal rate and regular rhythm.     Heart sounds: Normal heart sounds.  Pulmonary:     Effort: Pulmonary effort is normal.     Breath sounds: Wheezing (left lobes with faint wheezing on expiration ) present.  Abdominal:     General: Bowel sounds are normal.     Palpations: Abdomen is soft.  Musculoskeletal:        General: No tenderness or edema.     Cervical back: Normal range of motion and neck supple.  Skin:    General: Skin is warm and dry.     Comments: Scabbed over small area on buttocks  Neurological:     Mental Status: She is alert and oriented to person, place, and time.  Psychiatric:        Mood and Affect: Mood and affect normal.     Labs reviewed: Basic Metabolic Panel: Recent Labs    05/18/19 1925 05/18/19 2355 07/07/19 0123 07/08/19 0539 08/27/19 2016 08/28/19 0101 09/11/19 1424 11/15/19 0506 04/22/20 1232 04/29/20 1827 04/30/20 0538 05/01/20 0635  NA 134*   < >  --    < >  --  137   < > 134*   < > 146* 149* 143  K 4.3   < >  --    < >  --  4.6   < > 4.3   < > 3.9 3.8 3.7  CL 101   < >  --    < >  --  103   < > 100   < > 108 113* 109  CO2 24   < >  --    < >  --  23   < > 24   < > 23 23 22   GLUCOSE 80   < >  --    < >  --  93   < > 162*    < > 119* 109* 164*  BUN 60*   < >  --    < >  --  44*   < > 37*   < > 74* 65* 43*  CREATININE 1.75*   < >  --    < >  --  1.34*   < > 1.37*   < > 2.31* 1.88* 1.28*  CALCIUM 9.4   < >  --    < >  --  9.7   < > 9.3   < > 9.9 9.5 9.0  MG 2.2  --   --   --  2.3  --   --  2.1  --   --   --   --   PHOS  --   --   --   --   --   --   --  4.4  --   --   --   --   TSH  --   --  1.623  --   --  1.428  --   --   --   --   --   --    < > = values in this interval not displayed.   Liver Function Tests: Recent Labs    04/29/20 1827 04/30/20 0538 05/01/20 0635  AST 34  31 31  ALT 24 22 20   ALKPHOS 77 73 65  BILITOT 1.1 0.8 0.6  PROT 8.5* 7.9 7.4  ALBUMIN 3.9 3.7 3.2*   Recent Labs    08/15/19 0519 09/24/19 2000 04/22/20 1232  LIPASE 30 24 25    No results for input(s): AMMONIA in the last 8760 hours. CBC: Recent Labs    09/24/19 2000 11/07/19 1454 11/15/19 0506 04/22/20 1232 04/29/20 1827 04/30/20 0538 05/01/20 0635  WBC 6.7 5.1 4.9   < > 5.9 5.6 5.1  NEUTROABS 4.3 2,917 4.4  --   --   --   --   HGB 9.1* 9.7* 9.3*   < > 11.1* 10.5* 10.5*  HCT 26.8* 29.9* 27.7*   < > 32.8* 31.9* 32.8*  MCV 91.5 91.7 90.2   < > 95.1 96.4 98.5  PLT 240 223 193   < > 296 239 223   < > = values in this interval not displayed.   Lipid Panel: No results for input(s): CHOL, HDL, LDLCALC, TRIG, CHOLHDL, LDLDIRECT in the last 8760 hours. TSH: Recent Labs    07/07/19 0123 08/28/19 0101  TSH 1.623 1.428   A1C: Lab Results  Component Value Date   HGBA1C 6.2 (H) 04/30/2020     Assessment/Plan 1. PEG (percutaneous endoscopic gastrostomy) status ([place]) -working with IR to get replacement - Ambulatory referral to [facility]- for nutrition/dietitian for nutritional support  2. Anemia of chronic disease -will follow up post hospitalization. - CBC with Differential/Platelet  3. AKI (acute kidney injury) ([place]) IV fluids during hospitalization, daughter has also increased free water because pt  has decreased oral intake.  - COMPLETE METABOLIC PANEL WITH GFR  4. Oral yeast infection - nystatin (MYCOSTATIN) 100000 UNIT/ML suspension; Take 5 mLs (500,000 Units total) by mouth 4 (four) times daily.  Dispense: 60 mL; Refill: 0  5. COPD with asthma ([place]) -stable, mild wheezing today but without cough, congestion or worsening shortness of breath. To use duoneb PRN.  6. Weakness -ongoing, continues with home health pt/ot - Ambulatory referral to [facility]  7. Weight loss -continues on supplements, needs nutritionist for further management to make sure she is getting adequate caloric intake. Home health ordered for nutritionist consultation  - Ambulatory referral to [place] [facility]  8. Malnutrition of moderate degree ([facility]) -due to dislodged PEG tube. Now has been replaced, continues with supplement.  - Ambulatory referral to [place] [facility] for nutritionist consultation   Next appt: 06/09/2020, sooner if needed  [name]. [place], [facility] [phone]

## 2020-05-07 NOTE — Patient Instructions (Signed)
Start nystatin oral solution 4 times daily for yeast   To use duoneb as needed for wheezing

## 2020-05-08 ENCOUNTER — Other Ambulatory Visit: Payer: Self-pay | Admitting: Nurse Practitioner

## 2020-05-08 DIAGNOSIS — B372 Candidiasis of skin and nail: Secondary | ICD-10-CM

## 2020-05-08 LAB — COMPLETE METABOLIC PANEL WITH GFR
AG Ratio: 1 (calc) (ref 1.0–2.5)
ALT: 20 U/L (ref 6–29)
AST: 25 U/L (ref 10–35)
Albumin: 3.7 g/dL (ref 3.6–5.1)
Alkaline phosphatase (APISO): 81 U/L (ref 37–153)
BUN/Creatinine Ratio: 31 (calc) — ABNORMAL HIGH (ref 6–22)
BUN: 48 mg/dL — ABNORMAL HIGH (ref 7–25)
CO2: 25 mmol/L (ref 20–32)
Calcium: 9.6 mg/dL (ref 8.6–10.4)
Chloride: 101 mmol/L (ref 98–110)
Creat: 1.53 mg/dL — ABNORMAL HIGH (ref 0.60–0.88)
GFR, Est African American: 33 mL/min/{1.73_m2} — ABNORMAL LOW (ref >=60)
GFR, Est Non African American: 29 mL/min/{1.73_m2} — ABNORMAL LOW (ref >=60)
Globulin: 3.8 g/dL (calc) — ABNORMAL HIGH (ref 1.9–3.7)
Glucose, Bld: 100 mg/dL (ref 65–139)
Potassium: 4.5 mmol/L (ref 3.5–5.3)
Sodium: 138 mmol/L (ref 135–146)
Total Bilirubin: 0.5 mg/dL (ref 0.2–1.2)
Total Protein: 7.5 g/dL (ref 6.1–8.1)

## 2020-05-08 LAB — CBC WITH DIFFERENTIAL/PLATELET
Absolute Monocytes: 775 cells/uL (ref 200–950)
Basophils Absolute: 40 cells/uL (ref 0–200)
Basophils Relative: 0.7 %
Eosinophils Absolute: 262 cells/uL (ref 15–500)
Eosinophils Relative: 4.6 %
HCT: 32.2 % — ABNORMAL LOW (ref 35.0–45.0)
Hemoglobin: 10.5 g/dL — ABNORMAL LOW (ref 11.7–15.5)
Lymphs Abs: 895 cells/uL (ref 850–3900)
MCH: 31.3 pg (ref 27.0–33.0)
MCHC: 32.6 g/dL (ref 32.0–36.0)
MCV: 96.1 fL (ref 80.0–100.0)
MPV: 12.7 fL — ABNORMAL HIGH (ref 7.5–12.5)
Monocytes Relative: 13.6 %
Neutro Abs: 3728 cells/uL (ref 1500–7800)
Neutrophils Relative %: 65.4 %
Platelets: 225 10*3/uL (ref 140–400)
RBC: 3.35 10*6/uL — ABNORMAL LOW (ref 3.80–5.10)
RDW: 14.2 % (ref 11.0–15.0)
Total Lymphocyte: 15.7 %
WBC: 5.7 10*3/uL (ref 3.8–10.8)

## 2020-05-09 LAB — WOUND CULTURE
MICRO NUMBER:: 11486915
RESULT:: NO GROWTH
SPECIMEN QUALITY:: ADEQUATE

## 2020-05-09 LAB — HOUSE ACCOUNT TRACKING

## 2020-05-10 ENCOUNTER — Other Ambulatory Visit: Payer: Self-pay | Admitting: *Deleted

## 2020-05-10 NOTE — Telephone Encounter (Signed)
Patient has request a refill on medication "Lantus Solostar". I tried to send medication into pharmacy but it needs an edit. Medication pend and sent to PCP [name], NP . Please Advise.

## 2020-05-10 NOTE — Patient Outreach (Signed)
[facility]) Care Management  05/10/2020  Shannon [name] 1923/04/06 [id number]   Call placed to member, state she is doing "ok."  Was seen by PCP since discharge, on 2/4, was also seen by podiatrist on 2/2.  She continues to have area healing on her foot since her surgery, will continue dressing changes and follow up on 3/16.  She had a feeding tube placed while hospitalized (one she had in fell out), but it wasn't the same type she previously had. She will have it replaced again this Wednesday in radiology.  Denies any urgent concerns, encouraged to contact this care manager with questions.  Agrees to follow up within the next month.  Goals Addressed            This Visit's Progress   . Remove Barriers to Better Nutrition       Timeframe:  Short-Term Goal Priority:  Medium Start Date:          05/10/2020                   Expected End Date:      06/08/2020                 Follow Up Date 06/08/2020    - take my supplements as prescribed    Why is this important?    Eating healthy is not always easy.   You may not feel hungry or food might not taste good.   It may be a lot of work to shop or make meals.   There are things you can do to make it easier.    Notes:   2/7 - feeding tube to be replaced on 2/9    . [facility] - Make and Keep All Appointments   On track    Follow Up Date 06/08/2020  Timeframe:  Short-Term Goal Priority:  Medium Start Date:       2/7                 Expected End Date:     3/8                    - ask family or friend for a ride - call to cancel if needed - keep a calendar with appointment dates    Why is this important?   Part of staying healthy is seeing the doctor for follow-up care.  If you forget your appointments, there are some things you can do to stay on track.    Notes:   11/22 - Upcoming appointments reviewed with member  12/21 - reminded of follow up with podiatrist  2/7 - Reviewed upcoming appointments with member, PCP  on 3/9, podiatrist on 3/16    . [facility] - Perform Foot Care   On track    Follow Up Date 06/08/2020  Timeframe:  Long-Range Goal Priority:  High Start Date:        2/7                  Expected End Date:    4/7                - check feet daily for cuts, sores or redness - wash and dry feet carefully every day - wear comfortable, cotton socks - wear comfortable, well-fitting shoes    Why is this important?   Good foot care is very important when you have diabetes.  There are many things  you can do to keep your feet healthy and catch a problem early.    Notes:   11/22 - Reviewed updated wound care per most recent MD visit  12/21 - Discussed ongoing wound care with member  2/7 - Reminded of wound care per podiatrist, encouraged to keep wound clean and dry       Kemper Durie, RN, MSN [facility] Care Management  [facility] Manager [phone]

## 2020-05-10 NOTE — Telephone Encounter (Signed)
Rest for Gabapentin denied as it contained incorrect instructions. A rx with updated instructions was sent for Gabapentin in January.

## 2020-05-12 ENCOUNTER — Other Ambulatory Visit: Payer: Self-pay

## 2020-05-12 ENCOUNTER — Ambulatory Visit (HOSPITAL_COMMUNITY)
Admission: RE | Admit: 2020-05-12 | Discharge: 2020-05-12 | Disposition: A | Discharge status: Home / Self Care | Payer: Medicare Other | Source: Ambulatory Visit | Attending: Diagnostic Radiology | Admitting: Diagnostic Radiology

## 2020-05-12 DIAGNOSIS — Z431 Encounter for attention to gastrostomy: Secondary | ICD-10-CM

## 2020-05-12 DIAGNOSIS — K9413 Enterostomy malfunction: Secondary | ICD-10-CM | POA: Diagnosis not present

## 2020-05-12 HISTORY — PX: IR GJ TUBE CHANGE: IMG1440

## 2020-05-12 MED ORDER — IOHEXOL 300 MG/ML  SOLN
50.0000 mL | Freq: Once | INTRAMUSCULAR | Status: AC | PRN
  Administered 2020-05-12: 20 mL

## 2020-05-12 MED ORDER — LIDOCAINE VISCOUS HCL 2 % MT SOLN
OROMUCOSAL | Status: AC
  Administered 2020-05-12: 4 mL
  Filled 2020-05-12: qty 15

## 2020-05-12 NOTE — Procedures (Signed)
Pre procedural Dx: Dysphagia, poorly functioning feeding tube. Post procedural Dx: Same  Successful fluoroscopic guided replacement of existing 22 Fr GJ tube.   The feeding tube is ready for immediate use.  EBL: None Complications: None immediate.  Ronny Bacon, MD Pager #: [phone]

## 2020-05-14 ENCOUNTER — Ambulatory Visit (INDEPENDENT_AMBULATORY_CARE_PROVIDER_SITE_OTHER): Payer: Medicare Other | Admitting: Nurse Practitioner

## 2020-05-14 ENCOUNTER — Ambulatory Visit (INDEPENDENT_AMBULATORY_CARE_PROVIDER_SITE_OTHER): Payer: Medicare Other

## 2020-05-14 ENCOUNTER — Other Ambulatory Visit: Payer: Self-pay

## 2020-05-14 ENCOUNTER — Encounter: Payer: Self-pay | Admitting: Nurse Practitioner

## 2020-05-14 VITALS — BP 140/80 | HR 72 | Temp 97.7°F | Ht 66.0 in | Wt 128.2 lb

## 2020-05-14 DIAGNOSIS — Z5181 Encounter for therapeutic drug level monitoring: Secondary | ICD-10-CM | POA: Diagnosis not present

## 2020-05-14 DIAGNOSIS — Z931 Gastrostomy status: Secondary | ICD-10-CM | POA: Diagnosis not present

## 2020-05-14 DIAGNOSIS — B370 Candidal stomatitis: Secondary | ICD-10-CM

## 2020-05-14 DIAGNOSIS — I4891 Unspecified atrial fibrillation: Secondary | ICD-10-CM | POA: Diagnosis not present

## 2020-05-14 DIAGNOSIS — I4892 Unspecified atrial flutter: Secondary | ICD-10-CM

## 2020-05-14 DIAGNOSIS — E440 Moderate protein-calorie malnutrition: Secondary | ICD-10-CM

## 2020-05-14 DIAGNOSIS — I4819 Other persistent atrial fibrillation: Secondary | ICD-10-CM

## 2020-05-14 LAB — POCT INR: INR: 1 — AB (ref 2.0–3.0)

## 2020-05-14 NOTE — Progress Notes (Signed)
Careteam: Patient Care Team: [name], NP as PCP - General (Geriatric Medicine) Dorothy Spark, MD as PCP - Cardiology (Cardiology) Edrick Kins, DPM as Consulting Physician (Podiatry) Dorothy Spark, MD as Consulting Physician (Cardiology) [name] Shannon American, NP as Nurse Practitioner (Geriatric Medicine) Camillo Flaming, [place] as Referring Physician (Optometry) [name], RN as [place] Management  PLACE OF SERVICE:  [place]  Advanced Directive information    Allergies  Allergen Reactions  . Penicillins Itching, Rash and Other (See Comments)    Tolerated amoxicillin, unasyn, zosyn & cephalosporins in the past. Did it involve swelling of the face/tongue/throat, SOB, or low BP? No Did it involve sudden or severe rash/hives, skin peeling, or any reaction on the inside of your mouth or nose? No Did you need to seek medical attention at a hospital or doctor's office? Unknown When did it last happen?5+ years If all above answers are "NO", may proceed with cephalosporin use.     Chief Complaint  Patient presents with  . Acute Visit    Shannon [name], patient's daughter states that patient is not absorbing coumadin. She states that patient is not receiving nutrition. It is going through the j tube and coming out of her g tube. She called interventional radiology and was told patient may have a blockage.Coumadin level last week was 1.4 this week it was 1. Coumadin was increased.Interventional radiology told her to make appointment with PCP.     HPI: [name] for acute concerns.  Pt has had low INR the past 2 INR checks.  Lab Results  Component Value Date   INR 1.0 (A) 05/14/2020   INR 1.4 (A) 05/04/2020   INR 2.2 (H) 05/02/2020   Last week her coumadin was increased coumadin- she has alternating doses between 5 mg and 7.5 mg daily.  Her INR has been going down but she has been taking them every day.   She recently  got a new G tube.  Has not had a change in the scheduling of the coumadin and takes it when she eats. She is swallowing medications.   No changes in other medication  Review of Systems:  Review of Systems  Constitutional: Negative for chills and fever.  Respiratory: Negative for cough.   Cardiovascular: Negative for chest pain and leg swelling.  Endo/Heme/Allergies: Does not bruise/bleed easily.    Past Medical History:  Diagnosis Date  . Acute respiratory failure ([place])   . Anemia    previous blood transfusions  . Arthritis    "all over"  . Asthma   . Atrial flutter ([place])   . Bradycardia    requiring previous d/c of BB and reduction of amiodarone  . CAD (coronary artery disease)    nonobstructive per notes  . Chronic diastolic CHF (congestive heart failure) ([place])   . CKD (chronic kidney disease), stage III ([facility])   . Complication of blood transfusion    "got the wrong blood type at [facility] in ~ 2015; no adverse reaction that we are aware of"/daughter, Shannon [name] (01/27/2016)  . COPD (chronic obstructive pulmonary disease) ([place])   . Depression    "light case"  . DVT (deep venous thrombosis) ([place]) 01/2016   a. LLE DVT 01/2016 - switched from Eliquis to Coumadin.  [name] Dyspnea    with some activity  . Gastric stenosis    a. s/p stomach tube  . GERD (gastroesophageal reflux disease)   . Headache   .  History of 2019 novel coronavirus disease (COVID-19)   . History of blood transfusion    "several" (01/27/2016)  . History of stomach ulcers   . Hyperlipidemia   . Hypertension   . Hypothyroidism   . On home oxygen therapy    "2L; 9PM til 9AM" (06/27/2017)  . PAD (peripheral artery disease) ([place])    a. prior gangrene, toe amputations, intervention.  [name] PAF (paroxysmal atrial fibrillation) ([place])   . Paraesophageal hernia   . Perforated gastric ulcer ([place])   . Pneumonia    "a few times" (06/27/2017)  . Seasonal allergies   . SIADH (syndrome of inappropriate ADH production) ([place])     [name] 01/10/2015  . Small bowel obstruction ([place])    "I don't know how many" (01/11/2015)  . Stroke [facility])    several- no residual  . Type II diabetes mellitus ([place])    "related to prednisone use  for > 20 yr; once predinose stopped; no more DM RX" (01/27/2016)  . UTI (urinary tract infection) 02/08/2016  . Ventral hernia with bowel obstruction    Past Surgical History:  Procedure Laterality Date  . ABDOMINAL AORTOGRAM N/A 09/21/2016   Procedure: Abdominal Aortogram;  Surgeon: Waynetta Sandy, MD;  Location: [facility];  Service: Cardiovascular;  Laterality: N/A;  . AMPUTATION Left 09/25/2016   Procedure: AMPUTATION DIGIT- LEFT 2ND AND 3RD TOES;  Surgeon: Rosetta Posner, MD;  Location: [facility];  Service: Vascular;  Laterality: Left;  . AMPUTATION Right 05/03/2018   Procedure: AMPUTATION RIGHT GREAT TOE;  Surgeon: Rosetta Posner, MD;  Location: [facility];  Service: Vascular;  Laterality: Right;  . CATARACT EXTRACTION W/ INTRAOCULAR LENS  IMPLANT, BILATERAL    . CHOLECYSTECTOMY OPEN    . COLECTOMY    . ESOPHAGOGASTRODUODENOSCOPY N/A 01/19/2014   Procedure: ESOPHAGOGASTRODUODENOSCOPY (EGD);  Surgeon: Irene Shipper, MD;  Location: [facility];  Service: Endoscopy;  Laterality: N/A;  . ESOPHAGOGASTRODUODENOSCOPY N/A 01/20/2014   Procedure: ESOPHAGOGASTRODUODENOSCOPY (EGD);  Surgeon: Irene Shipper, MD;  Location: [facility];  Service: Endoscopy;  Laterality: N/A;  . ESOPHAGOGASTRODUODENOSCOPY N/A 03/19/2014   Procedure: ESOPHAGOGASTRODUODENOSCOPY (EGD);  Surgeon: Milus Banister, MD;  Location: [facility];  Service: Endoscopy;  Laterality: N/A;  . ESOPHAGOGASTRODUODENOSCOPY N/A 07/08/2015   Procedure: ESOPHAGOGASTRODUODENOSCOPY (EGD);  Surgeon: Doran Stabler, MD;  Location: [facility];  Service: Endoscopy;  Laterality: N/A;  . ESOPHAGOGASTRODUODENOSCOPY (EGD) WITH PROPOFOL N/A 09/15/2015   Procedure: ESOPHAGOGASTRODUODENOSCOPY (EGD) WITH PROPOFOL;  Surgeon: Manus Gunning, MD;  [facility];  Service: Gastroenterology;  Laterality: N/A;  . GASTROJEJUNOSTOMY     hx/notes 01/10/2015  . GASTROJEJUNOSTOMY N/A 09/23/2015   Procedure: OPEN GASTROJEJUNOSTOMY TUBE PLACEMENT;  Surgeon: Arta Bruce Kinsinger, MD;  [facility];  Service: General;  Laterality: N/A;  . GLAUCOMA SURGERY Bilateral   . HERNIA REPAIR  2015  . IR CM INJ ANY COLONIC TUBE W/FLUORO  01/05/2017  . IR CM INJ ANY COLONIC TUBE W/FLUORO  06/07/2017  . IR CM INJ ANY COLONIC TUBE W/FLUORO  11/05/2017  . IR CM INJ ANY COLONIC TUBE W/FLUORO  09/18/2018  . IR CM INJ ANY COLONIC TUBE W/FLUORO  03/06/2019  . IR CM INJ ANY COLONIC TUBE W/FLUORO  03/11/2019  . IR GASTR TUBE CONVERT GASTR-JEJ PER W/FL MOD SED  04/30/2020  . IR GENERIC HISTORICAL  01/07/2016   IR GJ TUBE CHANGE 01/07/2016 Jacqulynn Cadet, MD [facility]  . IR GENERIC HISTORICAL  01/27/2016   IR MECH  REMOV OBSTRUC MAT ANY COLON TUBE W/FLUORO 01/27/2016 Markus Daft, MD [facility]  . IR GENERIC HISTORICAL  02/07/2016   IR PATIENT EVAL TECH 0-60 MINS Darrell K Allred, PA-C [facility]  . IR GENERIC HISTORICAL  02/08/2016   IR GJ TUBE CHANGE 02/08/2016 Greggory Keen, MD [facility]  . IR GENERIC HISTORICAL  01/06/2016   IR GJ TUBE CHANGE 01/06/2016 [facility] OUT REF  . IR GENERIC HISTORICAL  05/02/2016   IR CM INJ ANY COLONIC TUBE W/FLUORO 05/02/2016 Arne Cleveland, MD [facility]  . IR GENERIC HISTORICAL  05/15/2016   IR GJ TUBE CHANGE 05/15/2016 Sandi Mariscal, MD [facility]  . IR GENERIC HISTORICAL  06/28/2016   IR GJ TUBE CHANGE 06/28/2016 [facility]  . IR GJ TUBE CHANGE  02/20/2017  . IR GJ TUBE CHANGE  05/10/2017  . IR GJ TUBE CHANGE  07/06/2017  . IR GJ TUBE CHANGE  08/02/2017  . IR GJ TUBE CHANGE  10/15/2017  . IR GJ TUBE CHANGE  12/26/2017  . IR [place] TUBE CHANGE  04/08/2018  . IR [place] TUBE CHANGE  06/19/2018  . IR [place] TUBE CHANGE  03/03/2019  . IR [place] TUBE CHANGE  06/06/2019  . IR GJ TUBE CHANGE  09/26/2019  . IR GJ TUBE CHANGE   01/09/2020  . IR GJ TUBE CHANGE  05/12/2020  . IR PATIENT EVAL TECH 0-60 MINS  10/19/2016  . IR PATIENT EVAL TECH 0-60 MINS  12/25/2016  . IR PATIENT EVAL TECH 0-60 MINS  01/29/2017  . IR PATIENT EVAL TECH 0-60 MINS  04/04/2017  . IR PATIENT EVAL TECH 0-60 MINS  04/30/2017  . IR PATIENT EVAL TECH 0-60 MINS  07/31/2017  . IR REPLC DUODEN/JEJUNO TUBE PERCUT W/FLUORO  11/14/2016  . LAPAROTOMY N/A 01/20/2015   Procedure: EXPLORATORY LAPAROTOMY;  Surgeon: Coralie Keens, MD;  Location: [facility];  Service: General;  Laterality: N/A;  . LOWER EXTREMITY ANGIOGRAPHY Left 09/21/2016   Procedure: Lower Extremity Angiography;  Surgeon: Waynetta Sandy, MD;  Location: [facility];  Service: Cardiovascular;  Laterality: Left;  . LOWER EXTREMITY ANGIOGRAPHY Right 06/27/2017   Procedure: Lower Extremity Angiography;  Surgeon: Waynetta Sandy, MD;  Location: [facility];  Service: Cardiovascular;  Laterality: Right;  . LYSIS OF ADHESION N/A 01/20/2015   Procedure: LYSIS OF ADHESIONS < 1 HOUR;  Surgeon: Coralie Keens, MD;  Location: [facility];  Service: General;  Laterality: N/A;  . PERIPHERAL VASCULAR BALLOON ANGIOPLASTY Left 09/21/2016   Procedure: Peripheral Vascular Balloon Angioplasty;  Surgeon: Waynetta Sandy, MD;  Location: [facility];  Service: Cardiovascular;  Laterality: Left;  drug coated balloon  . PERIPHERAL VASCULAR BALLOON ANGIOPLASTY Right 06/27/2017   Procedure: PERIPHERAL VASCULAR BALLOON ANGIOPLASTY;  Surgeon: Waynetta Sandy, MD;  Location: [facility];  Service: Cardiovascular;  Laterality: Right;  SFA/[id number]  . TONSILLECTOMY    . TRANSMETATARSAL AMPUTATION Right 11/14/2019   Procedure: TRANSMETATARSAL AMPUTATION RIGHT FOOT;  Surgeon: Edrick Kins, DPM;  Location: [facility];  Service: Podiatry;  Laterality: Right;  . TUBAL LIGATION    . VENTRAL HERNIA REPAIR  2015   incarcerated ventral hernia ([facility] 09/2013)/notes 01/10/2015   Social  History:   reports that she has never smoked. She quit smokeless tobacco use about 40 years ago.  Her smokeless tobacco use included snuff. She reports that she does not drink alcohol and does not use drugs.  Family History  Problem Relation Age of Onset  .  Stroke Mother   . Hypertension Mother   . Diabetes Brother   . Glaucoma Son   . Glaucoma Son   . Heart attack Neg Hx   . Colon cancer Neg Hx   . Stomach cancer Neg Hx     Medications: Patient's Medications  New Prescriptions   No medications on file  Previous Medications   ACETAMINOPHEN (TYLENOL) 500 MG TABLET    Take 500 mg by mouth every 6 (six) hours as needed.   AMLODIPINE (NORVASC) 5 MG TABLET    Take 1 tablet (5 mg total) by mouth daily.   ATORVASTATIN (LIPITOR) 10 MG TABLET    TAKE 1 TABLET BY MOUTH EVERY DAY   B COMPLEX VITAMINS TABLET    Take 1 tablet by mouth daily.    BENZOCAINE (AMERICAINE) 20 % RECTAL OINTMENT    Place rectally every 3 (three) hours as needed for pain.   CALCIUM-VITAMIN D (OSCAL WITH D) 500-200 MG-UNIT PER TABLET    Take 1 tablet by mouth daily with breakfast.    CARBOXYMETHYLCELLULOSE SODIUM (ARTIFICIAL TEARS OP)    Place 1 drop into both eyes 4 (four) times daily.   CYCLOSPORINE (RESTASIS) 0.05 % OPHTHALMIC EMULSION    USE 1 DROP INTO BOTH EYES TWICE DAILY   DULERA 200-5 MCG/ACT AERO    TAKE TWO PUFFS BY MOUTH TWICE DAILY   DULOXETINE (CYMBALTA) 30 MG CAPSULE    TAKE 1 CAPSULE BY MOUTH EVERY DAY   FERROUS SULFATE 220 (44 FE) MG/5ML SOLUTION    TAKE 7.4 MLS (325 MG TOTAL) BY MOUTH DAILY.   FLUTICASONE (FLONASE) 50 MCG/ACT NASAL SPRAY    SPRAY 2 SPRAYS INTO EACH NOSTRIL EVERY DAY   FUROSEMIDE (LASIX) 40 MG TABLET    Take 0.5-1 tablets (20-40 mg total) by mouth See admin instructions. Take 20 mg daily, may instead take 40 mg daily as needed for edema   GABAPENTIN (NEURONTIN) 300 MG CAPSULE    Take 1 capsule (300 mg total) by mouth 2 (two) times daily.   GENTAMICIN CREAM (GARAMYCIN) 0.1 %    Apply 1  application topically daily as needed (dry skin).   HYDRALAZINE (APRESOLINE) 50 MG TABLET    TAKE 1 TABLET BY MOUTH THREE TIMES A DAY   INSULIN ASPART (NOVOLOG) 100 UNIT/ML FLEXPEN    Inject into the skin as needed for high blood sugar. New sliding Scale If BS is 151 - 251 = 2 units,  If BS is 251 -  350 = 4 units,  If BS is 351 - 450 = 6 units Call provider if BS runs more than 400   INSULIN PEN NEEDLE (B-D UF III MINI PEN NEEDLES) 31G X 5 MM MISC    Use twice daily with giving insulin injections. Dx E11.9   IPRATROPIUM-ALBUTEROL (DUONEB) 0.5-2.5 (3) MG/3ML SOLN    Take 3 mLs by nebulization every 6 (six) hours as needed (shortness of breath).   ISOSORBIDE MONONITRATE (IMDUR) 30 MG 24 HR TABLET    TAKE 2 TABLETS BY MOUTH DAILY. PLEASE MAKE ANNUAL APPT WITH DR. Meda Coffee FOR FUTURE REFILLS   LANTUS SOLOSTAR 100 UNIT/ML SOLOSTAR PEN    INJECT 8 UNITS INTO THE SKIN AT BEDTIME.   LATANOPROST (XALATAN) 0.005 % OPHTHALMIC SOLUTION    1 drop at bedtime.   LEVALBUTEROL (XOPENEX HFA) 45 MCG/ACT INHALER    Inhale 2 puffs into the lungs every 6 (six) hours as needed for wheezing.   LEVOTHYROXINE (SYNTHROID) 125 MCG TABLET  TAKE 1 TABLET BY MOUTH EVERY DAY   LORATADINE (CLARITIN) 10 MG TABLET    Take 10 mg by mouth daily as needed for allergies.   MULTIPLE VITAMIN (MULTIVITAMIN WITH MINERALS) TABS TABLET    Take 1 tablet by mouth daily.   NUTRITIONAL SUPPLEMENTS (FEEDING SUPPLEMENT, OSMOLITE 1.5 CAL,) LIQD    Place 711 mLs into feeding tube See admin instructions. Take 711 ml over a 12 hour period per day   NYSTATIN (MYCOSTATIN) 100000 UNIT/ML SUSPENSION    Take 5 mLs (500,000 Units total) by mouth 4 (four) times daily.   NYSTATIN (MYCOSTATIN/NYSTOP) POWDER    Apply 1 application topically as needed (skin irritation).   NYSTATIN CREAM (MYCOSTATIN)    APPLY TO AFFECTED AREA TWICE A DAY   OXYCODONE (ROXICODONE) 5 MG IMMEDIATE RELEASE TABLET    Take 1 tablet (5 mg total) by mouth every 4 (four) hours as needed for  severe pain. May take 1/2 tab q4hrs as needed for mild pain.   PANTOPRAZOLE (PROTONIX) 40 MG TABLET    TAKE 1 TABLET BY MOUTH EVERY DAY   POLYETHYLENE GLYCOL (MIRALAX / GLYCOLAX) 17 G PACKET    Take 17 g by mouth daily as needed for mild constipation.   PROBIOTIC PRODUCT (EQL PROBIOTIC COLON SUPPORT PO)    Take 1 capsule by mouth daily.   SENNA-DOCUSATE (SENOKOT-S) 8.6-50 MG TABLET    Take 1 tablet by mouth as needed for mild constipation.   WARFARIN (COUMADIN) 2.5 MG TABLET    TAKE 2-3 TABLETS BY MOUTH DAILY AS DIRECTED BY COUMADIN CLINIC.   WATER FOR IRRIGATION, STERILE (FREE WATER) SOLN    Place 180 mLs into feeding tube 3 (three) times daily.    WHITE PETROLATUM-MINERAL OIL (SYSTANE NIGHTTIME) OINT    Place 1 application into both eyes at bedtime.   Modified Medications   No medications on file  Discontinued Medications   No medications on file    Physical Exam:  Vitals:   05/14/20 1517  BP: 140/80  Pulse: 72  Temp: 97.7 F (36.5 C)  TempSrc: Temporal  SpO2: 99%  Weight: 128 lb 3.2 oz (58.2 kg)  Height: '5\' 6"'  (1.676 m)   Body mass index is 20.69 kg/m. Wt Readings from Last 3 Encounters:  05/14/20 128 lb 3.2 oz (58.2 kg)  05/07/20 126 lb 9.6 oz (57.4 kg)  05/01/20 132 lb 4.4 oz (60 kg)    Physical Exam Constitutional:      Appearance: Normal appearance.  Cardiovascular:     Rate and Rhythm: Normal rate and regular rhythm.  Pulmonary:     Effort: Pulmonary effort is normal.     Breath sounds: Normal breath sounds.  Abdominal:     General: Abdomen is flat. Bowel sounds are normal. There is no distension.     Palpations: Abdomen is soft.     Tenderness: There is no abdominal tenderness. There is no guarding.     Comments: PEG site intact  Musculoskeletal:     Cervical back: Normal range of motion and neck supple.  Neurological:     Mental Status: She is alert.     Labs reviewed: Basic Metabolic Panel: Recent Labs    05/18/19 1925 05/18/19 2355  07/07/19 0123 07/08/19 0539 08/27/19 2016 08/28/19 0101 09/11/19 1424 11/15/19 0506 04/22/20 1232 04/30/20 0538 05/01/20 0635 05/07/20 1436  NA 134*   < >  --    < >  --  137   < > 134*   < >  149* 143 138  K 4.3   < >  --    < >  --  4.6   < > 4.3   < > 3.8 3.7 4.5  CL 101   < >  --    < >  --  103   < > 100   < > 113* 109 101  CO2 24   < >  --    < >  --  23   < > 24   < > '23 22 25  ' GLUCOSE 80   < >  --    < >  --  93   < > 162*   < > 109* 164* 100  BUN 60*   < >  --    < >  --  44*   < > 37*   < > 65* 43* 48*  CREATININE 1.75*   < >  --    < >  --  1.34*   < > 1.37*   < > 1.88* 1.28* 1.53*  CALCIUM 9.4   < >  --    < >  --  9.7   < > 9.3   < > 9.5 9.0 9.6  MG 2.2  --   --   --  2.3  --   --  2.1  --   --   --   --   PHOS  --   --   --   --   --   --   --  4.4  --   --   --   --   TSH  --   --  1.623  --   --  1.428  --   --   --   --   --   --    < > = values in this interval not displayed.   Liver Function Tests: Recent Labs    04/29/20 1827 04/30/20 0538 05/01/20 0635 05/07/20 1436  AST 34 '31 31 25  ' ALT '24 22 20 20  ' ALKPHOS 77 73 65  --   BILITOT 1.1 0.8 0.6 0.5  PROT 8.5* 7.9 7.4 7.5  ALBUMIN 3.9 3.7 3.2*  --    Recent Labs    08/15/19 0519 09/24/19 2000 04/22/20 1232  LIPASE '30 24 25   ' No results for input(s): AMMONIA in the last 8760 hours. CBC: Recent Labs    11/07/19 1454 11/15/19 0506 04/22/20 1232 04/30/20 0538 05/01/20 0635 05/07/20 1436  WBC 5.1 4.9   < > 5.6 5.1 5.7  NEUTROABS 2,917 4.4  --   --   --  3,728  HGB 9.7* 9.3*   < > 10.5* 10.5* 10.5*  HCT 29.9* 27.7*   < > 31.9* 32.8* 32.2*  MCV 91.7 90.2   < > 96.4 98.5 96.1  PLT 223 193   < > 239 223 225   < > = values in this interval not displayed.   Lipid Panel: No results for input(s): CHOL, HDL, LDLCALC, TRIG, CHOLHDL, LDLDIRECT in the last 8760 hours. TSH: Recent Labs    07/07/19 0123 08/28/19 0101  TSH 1.623 1.428   A1C: Lab Results  Component Value Date   HGBA1C 6.2 (H)  04/30/2020     Assessment/Plan 1. Oral yeast infection Improved on nystatin. Has completed   2. A fib Currently on coumadin, INR 1.0 today despite increasing comadin 10 days ago. She was instructed from coumadin clinic to increase  coumadin to 7.5 daily for the next 3 days. Will have her follow up INR in office in 3 days. Instructed her to make sure she was getting coumadin correct dose daily. To use a different pill organizer for her coumadin since she is on a lot of medication to ensure she is taking.   3. Malnutrition of moderate degree ([place]) Has met with the nutritionist and plans to adjust tube feeding.   4. PEG (percutaneous endoscopic gastrostomy) status ([place]) -recently with replacement of tube 2 days ago, increase drainage last night but felt like this was due to new tube. No increase in pain, redness or swelling.   Next appt: 05/17/2020 Shannon [name]. [place] [phone]

## 2020-05-14 NOTE — Patient Instructions (Signed)
Continue 7.5 mg by mouth daily for today, tomorrow and Sunday.  Then follow up Monday follow up for INR  If unchanged will get GI consult

## 2020-05-14 NOTE — Patient Instructions (Signed)
Description   Take 3 tablets x 3 dosages, then start taking warfarin 2 tablets daily except 3 tablets on Sundays, Tuesdays and Thursdays. Recheck in 1 week.  Call Coumadin Clinic with any new medications [phone].

## 2020-05-17 ENCOUNTER — Encounter: Payer: Self-pay | Admitting: Nurse Practitioner

## 2020-05-17 ENCOUNTER — Ambulatory Visit (INDEPENDENT_AMBULATORY_CARE_PROVIDER_SITE_OTHER): Payer: Medicare Other | Admitting: Nurse Practitioner

## 2020-05-17 ENCOUNTER — Other Ambulatory Visit: Payer: Self-pay

## 2020-05-17 VITALS — BP 132/64 | HR 74 | Temp 96.9°F | Ht 66.0 in | Wt 130.0 lb

## 2020-05-17 DIAGNOSIS — E440 Moderate protein-calorie malnutrition: Secondary | ICD-10-CM | POA: Diagnosis not present

## 2020-05-17 DIAGNOSIS — Z931 Gastrostomy status: Secondary | ICD-10-CM

## 2020-05-17 DIAGNOSIS — Z7901 Long term (current) use of anticoagulants: Secondary | ICD-10-CM | POA: Diagnosis not present

## 2020-05-17 DIAGNOSIS — I4819 Other persistent atrial fibrillation: Secondary | ICD-10-CM

## 2020-05-17 LAB — POCT INR: INR: 1.4 — AB (ref 2.0–3.0)

## 2020-05-17 MED ORDER — GABAPENTIN 300 MG PO CAPS: 300.0000 mg | ORAL_CAPSULE | Freq: Two times a day (BID) | ORAL | 1 refills | Status: DC

## 2020-05-17 MED ORDER — WARFARIN SODIUM 7.5 MG PO TABS: 7.5000 mg | ORAL_TABLET | Freq: Every day | ORAL | Status: DC

## 2020-05-17 NOTE — Progress Notes (Signed)
Careteam: Patient Care Team: [name], NP as PCP - General (Geriatric Medicine) Dorothy Spark, MD as PCP - Cardiology (Cardiology) Edrick Kins, DPM as Consulting Physician (Podiatry) Dorothy Spark, MD as Consulting Physician (Cardiology) [name] [name], NP as Nurse Practitioner (Geriatric Medicine) Camillo Flaming, [place] as Referring Physician (Optometry) [name], RN as [place] Management  PLACE OF SERVICE:  [place]  Advanced Directive information    Allergies  Allergen Reactions  . Penicillins Itching, Rash and Other (See Comments)    Tolerated amoxicillin, unasyn, zosyn & cephalosporins in the past. Did it involve swelling of the face/tongue/throat, SOB, or low BP? No Did it involve sudden or severe rash/hives, skin peeling, or any reaction on the inside of your mouth or nose? No Did you need to seek medical attention at a hospital or doctor's office? Unknown When did it last happen?5+ years If all above answers are "NO", may proceed with cephalosporin use.     Chief Complaint  Patient presents with  . Anticoagulation    PT/INR check, no bleeding, no diet changes, an no missed doses. 1.4      HPI: [name] seen today to follow up INR. She is followed at the coumadin clinic and INR was not increasing despite increasing medication. She was taking coumadin 7.5 mg alternating with 5 mg. The last 3 days she has been on 7.5 mg daily and now with INR of 1.4.   She has not connected the drain bag since she was here last. She has a new tube and it is no longer leaking even without the drainage bag. Daughter was previously giving 300 cc TID of free water because she was dehydrated in hospital but prior instructions were 240 cc TID   Review of Systems:  Review of Systems  Constitutional: Negative for chills, fever and weight loss.  Respiratory: Negative for cough.   Cardiovascular: Negative for chest  pain, palpitations and leg swelling.  Gastrointestinal: Negative for abdominal pain, constipation, diarrhea, heartburn, nausea and vomiting.  Genitourinary: Negative for dysuria, frequency and urgency.  Musculoskeletal: Negative for joint pain.  Skin: Negative.   Neurological: Negative for dizziness and headaches.  Psychiatric/Behavioral: Negative for memory loss.    Past Medical History:  Diagnosis Date  . Acute respiratory failure ([place])   . Anemia    previous blood transfusions  . Arthritis    "all over"  . Asthma   . Atrial flutter ([place])   . Bradycardia    requiring previous d/c of BB and reduction of amiodarone  . CAD (coronary artery disease)    nonobstructive per notes  . Chronic diastolic CHF (congestive heart failure) ([place])   . CKD (chronic kidney disease), stage III ([place])   . Complication of blood transfusion    "got the wrong blood type at [facility] in ~ 2015; no adverse reaction that we are aware of"/daughter, [name] (01/27/2016)  . COPD (chronic obstructive pulmonary disease) ([place])   . Depression    "light case"  . DVT (deep venous thrombosis) ([place]) 01/2016   a. LLE DVT 01/2016 - switched from Eliquis to Coumadin.  [name] Dyspnea    with some activity  . Gastric stenosis    a. s/p stomach tube  . GERD (gastroesophageal reflux disease)   . Headache   . History of 2019 novel coronavirus disease (COVID-19)   . History of blood transfusion    "several" (01/27/2016)  . History  of stomach ulcers   . Hyperlipidemia   . Hypertension   . Hypothyroidism   . On home oxygen therapy    "2L; 9PM til 9AM" (06/27/2017)  . PAD (peripheral artery disease) ([place])    a. prior gangrene, toe amputations, intervention.  [name] PAF (paroxysmal atrial fibrillation) ([place])   . Paraesophageal hernia   . Perforated gastric ulcer ([place])   . Pneumonia    "a few times" (06/27/2017)  . Seasonal allergies   . SIADH (syndrome of inappropriate ADH production) ([place])    [name] 01/10/2015  . Small  bowel obstruction ([place])    "I don't know how many" (01/11/2015)  . Stroke [facility])    several- no residual  . Type II diabetes mellitus ([place])    "related to prednisone use  for > 20 yr; once predinose stopped; no more DM RX" (01/27/2016)  . UTI (urinary tract infection) 02/08/2016  . Ventral hernia with bowel obstruction    Past Surgical History:  Procedure Laterality Date  . ABDOMINAL AORTOGRAM N/A 09/21/2016   Procedure: Abdominal Aortogram;  Surgeon: Waynetta Sandy, MD;  Location: [place] [facility];  Service: Cardiovascular;  Laterality: N/A;  . AMPUTATION Left 09/25/2016   Procedure: AMPUTATION DIGIT- LEFT 2ND AND 3RD TOES;  Surgeon: Rosetta Posner, MD;  Location: [place];  Service: Vascular;  Laterality: Left;  . AMPUTATION Right 05/03/2018   Procedure: AMPUTATION RIGHT GREAT TOE;  Surgeon: Rosetta Posner, MD;  Location: [place];  Service: Vascular;  Laterality: Right;  . CATARACT EXTRACTION W/ INTRAOCULAR LENS  IMPLANT, BILATERAL    . CHOLECYSTECTOMY OPEN    . COLECTOMY    . ESOPHAGOGASTRODUODENOSCOPY N/A 01/19/2014   Procedure: ESOPHAGOGASTRODUODENOSCOPY (EGD);  Surgeon: Irene Shipper, MD;  Location[facility];  Service: Endoscopy;  Laterality: N/A;  . ESOPHAGOGASTRODUODENOSCOPY N/A 01/20/2014   Procedure: ESOPHAGOGASTRODUODENOSCOPY (EGD);  Surgeon: Irene Shipper, MD;  Location: [facility];  Service: Endoscopy;  Laterality: N/A;  . ESOPHAGOGASTRODUODENOSCOPY N/A 03/19/2014   Procedure: ESOPHAGOGASTRODUODENOSCOPY (EGD);  Surgeon: Milus Banister, MD;  Location: [facility];  Service: Endoscopy;  Laterality: N/A;  . ESOPHAGOGASTRODUODENOSCOPY N/A 07/08/2015   Procedure: ESOPHAGOGASTRODUODENOSCOPY (EGD);  Surgeon: Doran Stabler, MD;  Location: [facility] ENDOSCOPY;  Service: Endoscopy;  Laterality: N/A;  . ESOPHAGOGASTRODUODENOSCOPY (EGD) WITH PROPOFOL N/A 09/15/2015   Procedure: ESOPHAGOGASTRODUODENOSCOPY (EGD) WITH PROPOFOL;  Surgeon: Manus Gunning, MD;  [facility];  Service: Gastroenterology;  Laterality: N/A;  . GASTROJEJUNOSTOMY     hx/notes 01/10/2015  . GASTROJEJUNOSTOMY N/A 09/23/2015   Procedure: OPEN GASTROJEJUNOSTOMY TUBE PLACEMENT;  Surgeon: Arta Bruce Kinsinger, MD;  [facility];  Service: General;  Laterality: N/A;  . GLAUCOMA SURGERY Bilateral   . HERNIA REPAIR  2015  . IR CM INJ ANY COLONIC TUBE W/FLUORO  01/05/2017  . IR CM INJ ANY COLONIC TUBE W/FLUORO  06/07/2017  . IR CM INJ ANY COLONIC TUBE W/FLUORO  11/05/2017  . IR CM INJ ANY COLONIC TUBE W/FLUORO  09/18/2018  . IR CM INJ ANY COLONIC TUBE W/FLUORO  03/06/2019  . IR CM INJ ANY COLONIC TUBE W/FLUORO  03/11/2019  . IR GASTR TUBE CONVERT GASTR-JEJ PER W/FL MOD SED  04/30/2020  . IR GENERIC HISTORICAL  01/07/2016   IR GJ TUBE CHANGE 01/07/2016 Jacqulynn Cadet, MD [facility]  . IR GENERIC HISTORICAL  01/27/2016   IR MECH REMOV OBSTRUC MAT ANY COLON TUBE W/FLUORO 01/27/2016 Markus Daft, MD [facility]  . IR GENERIC HISTORICAL  02/07/2016  IR PATIENT EVAL TECH 0-60 MINS Darrell K Allred, PA-C [facility]  . IR GENERIC HISTORICAL  02/08/2016   IR GJ TUBE CHANGE 02/08/2016 Greggory Keen, MD [facility]  . IR GENERIC HISTORICAL  01/06/2016   IR GJ TUBE CHANGE 01/06/2016 [facility] OUT REF  . IR GENERIC HISTORICAL  05/02/2016   IR CM INJ ANY COLONIC TUBE W/FLUORO 05/02/2016 Arne Cleveland, MD [facility]  . IR GENERIC HISTORICAL  05/15/2016   IR GJ TUBE CHANGE 05/15/2016 Sandi Mariscal, MD [facility]  . IR GENERIC HISTORICAL  06/28/2016   IR GJ TUBE CHANGE 06/28/2016 [facility]  . IR GJ TUBE CHANGE  02/20/2017  . IR GJ TUBE CHANGE  05/10/2017  . IR GJ TUBE CHANGE  07/06/2017  . IR GJ TUBE CHANGE  08/02/2017  . IR GJ TUBE CHANGE  10/15/2017  . IR GJ TUBE CHANGE  12/26/2017  . IR [place] TUBE CHANGE  04/08/2018  . IR [place] TUBE CHANGE  06/19/2018  . IR [place] TUBE CHANGE  03/03/2019  . IR [place] TUBE CHANGE  06/06/2019  . IR GJ TUBE CHANGE  09/26/2019  . IR GJ TUBE CHANGE  01/09/2020  . IR GJ TUBE CHANGE   05/12/2020  . IR PATIENT EVAL TECH 0-60 MINS  10/19/2016  . IR PATIENT EVAL TECH 0-60 MINS  12/25/2016  . IR PATIENT EVAL TECH 0-60 MINS  01/29/2017  . IR PATIENT EVAL TECH 0-60 MINS  04/04/2017  . IR PATIENT EVAL TECH 0-60 MINS  04/30/2017  . IR PATIENT EVAL TECH 0-60 MINS  07/31/2017  . IR REPLC DUODEN/JEJUNO TUBE PERCUT W/FLUORO  11/14/2016  . LAPAROTOMY N/A 01/20/2015   Procedure: EXPLORATORY LAPAROTOMY;  Surgeon: Coralie Keens, MD;  Location: [place];  Service: General;  Laterality: N/A;  . LOWER EXTREMITY ANGIOGRAPHY Left 09/21/2016   Procedure: Lower Extremity Angiography;  Surgeon: Waynetta Sandy, MD;  Location: [place] [facility];  Service: Cardiovascular;  Laterality: Left;  . LOWER EXTREMITY ANGIOGRAPHY Right 06/27/2017   Procedure: Lower Extremity Angiography;  Surgeon: Waynetta Sandy, MD;  Location: [place] [facility];  Service: Cardiovascular;  Laterality: Right;  . LYSIS OF ADHESION N/A 01/20/2015   Procedure: LYSIS OF ADHESIONS < 1 HOUR;  Surgeon: Coralie Keens, MD;  Location: [place];  Service: General;  Laterality: N/A;  . PERIPHERAL VASCULAR BALLOON ANGIOPLASTY Left 09/21/2016   Procedure: Peripheral Vascular Balloon Angioplasty;  Surgeon: Waynetta Sandy, MD;  Location: [place] [facility];  Service: Cardiovascular;  Laterality: Left;  drug coated balloon  . PERIPHERAL VASCULAR BALLOON ANGIOPLASTY Right 06/27/2017   Procedure: PERIPHERAL VASCULAR BALLOON ANGIOPLASTY;  Surgeon: Waynetta Sandy, MD;  Location: [place] [facility];  Service: Cardiovascular;  Laterality: Right;  SFA/TPTRUNK  . TONSILLECTOMY    . TRANSMETATARSAL AMPUTATION Right 11/14/2019   Procedure: TRANSMETATARSAL AMPUTATION RIGHT FOOT;  Surgeon: Edrick Kins, DPM;  Location: [place];  Service: Podiatry;  Laterality: Right;  . TUBAL LIGATION    . VENTRAL HERNIA REPAIR  2015   incarcerated ventral hernia ([facility] 09/2013)/notes 01/10/2015   Social History:   reports that she has  never smoked. She quit smokeless tobacco use about 40 years ago.  Her smokeless tobacco use included snuff. She reports that she does not drink alcohol and does not use drugs.  Family History  Problem Relation Age of Onset  . Stroke Mother   . Hypertension Mother   . Diabetes Brother   . Glaucoma Son   . Glaucoma Son   .  Heart attack Neg Hx   . Colon cancer Neg Hx   . Stomach cancer Neg Hx     Medications: Patient's Medications  New Prescriptions   No medications on file  Previous Medications   ACETAMINOPHEN (TYLENOL) 500 MG TABLET    Take 500 mg by mouth every 6 (six) hours as needed.   AMLODIPINE (NORVASC) 5 MG TABLET    Take 1 tablet (5 mg total) by mouth daily.   ATORVASTATIN (LIPITOR) 10 MG TABLET    TAKE 1 TABLET BY MOUTH EVERY DAY   B COMPLEX VITAMINS TABLET    Take 1 tablet by mouth daily.    BENZOCAINE (AMERICAINE) 20 % RECTAL OINTMENT    Place rectally every 3 (three) hours as needed for pain.   CALCIUM-VITAMIN D (OSCAL WITH D) 500-200 MG-UNIT PER TABLET    Take 1 tablet by mouth daily with breakfast.    CARBOXYMETHYLCELLULOSE SODIUM (ARTIFICIAL TEARS OP)    Place 1 drop into both eyes 4 (four) times daily.   CYCLOSPORINE (RESTASIS) 0.05 % OPHTHALMIC EMULSION    USE 1 DROP INTO BOTH EYES TWICE DAILY   DULERA 200-5 MCG/ACT AERO    TAKE TWO PUFFS BY MOUTH TWICE DAILY   DULOXETINE (CYMBALTA) 30 MG CAPSULE    TAKE 1 CAPSULE BY MOUTH EVERY DAY   FERROUS SULFATE 220 (44 FE) MG/5ML SOLUTION    TAKE 7.4 MLS (325 MG TOTAL) BY MOUTH DAILY.   FLUTICASONE (FLONASE) 50 MCG/ACT NASAL SPRAY    SPRAY 2 SPRAYS INTO EACH NOSTRIL EVERY DAY   FUROSEMIDE (LASIX) 40 MG TABLET    Take 0.5-1 tablets (20-40 mg total) by mouth See admin instructions. Take 20 mg daily, may instead take 40 mg daily as needed for edema   GABAPENTIN (NEURONTIN) 300 MG CAPSULE    Take 1 capsule (300 mg total) by mouth 2 (two) times daily.   GENTAMICIN CREAM (GARAMYCIN) 0.1 %    Apply 1 application topically daily as  needed (dry skin).   HYDRALAZINE (APRESOLINE) 50 MG TABLET    TAKE 1 TABLET BY MOUTH THREE TIMES A DAY   INSULIN ASPART (NOVOLOG) 100 UNIT/ML FLEXPEN    Inject into the skin as needed for high blood sugar. New sliding Scale If BS is 151 - 251 = 2 units,  If BS is 251 -  350 = 4 units,  If BS is 351 - 450 = 6 units Call provider if BS runs more than 400   INSULIN PEN NEEDLE (B-D UF III MINI PEN NEEDLES) 31G X 5 MM MISC    Use twice daily with giving insulin injections. Dx E11.9   IPRATROPIUM-ALBUTEROL (DUONEB) 0.5-2.5 (3) MG/3ML SOLN    Take 3 mLs by nebulization every 6 (six) hours as needed (shortness of breath).   ISOSORBIDE MONONITRATE (IMDUR) 30 MG 24 HR TABLET    TAKE 2 TABLETS BY MOUTH DAILY. PLEASE MAKE ANNUAL APPT WITH DR. Meda Coffee FOR FUTURE REFILLS   LANTUS SOLOSTAR 100 UNIT/ML SOLOSTAR PEN    INJECT 8 UNITS INTO THE SKIN AT BEDTIME.   LATANOPROST (XALATAN) 0.005 % OPHTHALMIC SOLUTION    1 drop at bedtime.   LEVALBUTEROL (XOPENEX HFA) 45 MCG/ACT INHALER    Inhale 2 puffs into the lungs every 6 (six) hours as needed for wheezing.   LEVOTHYROXINE (SYNTHROID) 125 MCG TABLET    TAKE 1 TABLET BY MOUTH EVERY DAY   LORATADINE (CLARITIN) 10 MG TABLET    Take 10 mg by mouth daily as needed  for allergies.   MULTIPLE VITAMIN (MULTIVITAMIN WITH MINERALS) TABS TABLET    Take 1 tablet by mouth daily.   NUTRITIONAL SUPPLEMENTS (FEEDING SUPPLEMENT, OSMOLITE 1.5 CAL,) LIQD    Place 711 mLs into feeding tube See admin instructions. Take 711 ml over a 12 hour period per day   NYSTATIN (MYCOSTATIN) 100000 UNIT/ML SUSPENSION    Take 5 mLs (500,000 Units total) by mouth 4 (four) times daily.   NYSTATIN (MYCOSTATIN/NYSTOP) POWDER    Apply 1 application topically as needed (skin irritation).   NYSTATIN CREAM (MYCOSTATIN)    APPLY TO AFFECTED AREA TWICE A DAY   OXYCODONE (ROXICODONE) 5 MG IMMEDIATE RELEASE TABLET    Take 1 tablet (5 mg total) by mouth every 4 (four) hours as needed for severe pain. May take 1/2 tab  q4hrs as needed for mild pain.   PANTOPRAZOLE (PROTONIX) 40 MG TABLET    TAKE 1 TABLET BY MOUTH EVERY DAY   POLYETHYLENE GLYCOL (MIRALAX / GLYCOLAX) 17 G PACKET    Take 17 g by mouth daily as needed for mild constipation.   PROBIOTIC PRODUCT (EQL PROBIOTIC COLON SUPPORT PO)    Take 1 capsule by mouth daily.   SENNA-DOCUSATE (SENOKOT-S) 8.6-50 MG TABLET    Take 1 tablet by mouth as needed for mild constipation.   WARFARIN (COUMADIN) 2.5 MG TABLET    Take 2.5 mg by mouth as directed. 7.5 mg (3 tablets) on Sun, Tues, and Thursday. 5 mg all other days   WATER FOR IRRIGATION, STERILE (FREE WATER) SOLN    Place 180 mLs into feeding tube 3 (three) times daily.    WHITE PETROLATUM-MINERAL OIL (SYSTANE NIGHTTIME) OINT    Place 1 application into both eyes at bedtime.   Modified Medications   No medications on file  Discontinued Medications   WARFARIN (COUMADIN) 2.5 MG TABLET    TAKE 2-3 TABLETS BY MOUTH DAILY AS DIRECTED BY COUMADIN CLINIC.    Physical Exam:  Vitals:   05/17/20 1545  BP: 132/64  Pulse: 74  Temp: (!) 96.9 F (36.1 C)  TempSrc: Temporal  SpO2: 99%  Weight: 130 lb (59 kg)  Height: 5\' 6"  (1.676 m)   Body mass index is 20.98 kg/m. Wt Readings from Last 3 Encounters:  05/17/20 130 lb (59 kg)  05/14/20 128 lb 3.2 oz (58.2 kg)  05/07/20 126 lb 9.6 oz (57.4 kg)    Physical Exam Constitutional:      General: She is not in acute distress.    Appearance: She is well-developed and well-nourished. She is not diaphoretic.  HENT:     Head: Normocephalic and atraumatic.     Mouth/Throat:     Mouth: Oropharynx is clear and moist.     Pharynx: No oropharyngeal exudate.  Eyes:     Conjunctiva/sclera: Conjunctivae normal.     Pupils: Pupils are equal, round, and reactive to light.  Cardiovascular:     Rate and Rhythm: Normal rate. Rhythm irregular.     Heart sounds: Normal heart sounds.  Pulmonary:     Effort: Pulmonary effort is normal.     Breath sounds: Normal breath sounds.   Musculoskeletal:        General: No edema.     Cervical back: Normal range of motion and neck supple.  Skin:    General: Skin is warm and dry.  Neurological:     Mental Status: She is alert and oriented to person, place, and time.  Psychiatric:  Mood and Affect: Mood and affect normal.     Labs reviewed: Basic Metabolic Panel: Recent Labs    05/18/19 1925 05/18/19 2355 07/07/19 0123 07/08/19 0539 08/27/19 2016 08/28/19 0101 09/11/19 1424 11/15/19 0506 04/22/20 1232 04/30/20 0538 05/01/20 0635 05/07/20 1436  NA 134*   < >  --    < >  --  137   < > 134*   < > 149* 143 138  K 4.3   < >  --    < >  --  4.6   < > 4.3   < > 3.8 3.7 4.5  CL 101   < >  --    < >  --  103   < > 100   < > 113* 109 101  CO2 24   < >  --    < >  --  23   < > 24   < > 23 22 25   GLUCOSE 80   < >  --    < >  --  93   < > 162*   < > 109* 164* 100  BUN 60*   < >  --    < >  --  44*   < > 37*   < > 65* 43* 48*  CREATININE 1.75*   < >  --    < >  --  1.34*   < > 1.37*   < > 1.88* 1.28* 1.53*  CALCIUM 9.4   < >  --    < >  --  9.7   < > 9.3   < > 9.5 9.0 9.6  MG 2.2  --   --   --  2.3  --   --  2.1  --   --   --   --   PHOS  --   --   --   --   --   --   --  4.4  --   --   --   --   TSH  --   --  1.623  --   --  1.428  --   --   --   --   --   --    < > = values in this interval not displayed.   Liver Function Tests: Recent Labs    04/29/20 1827 04/30/20 0538 05/01/20 0635 05/07/20 1436  AST 34 31 31 25   ALT 24 22 20 20   ALKPHOS 77 73 65  --   BILITOT 1.1 0.8 0.6 0.5  PROT 8.5* 7.9 7.4 7.5  ALBUMIN 3.9 3.7 3.2*  --    Recent Labs    08/15/19 0519 09/24/19 2000 04/22/20 1232  LIPASE 30 24 25    No results for input(s): AMMONIA in the last 8760 hours. CBC: Recent Labs    11/07/19 1454 11/15/19 0506 04/22/20 1232 04/30/20 0538 05/01/20 0635 05/07/20 1436  WBC 5.1 4.9   < > 5.6 5.1 5.7  NEUTROABS 2,917 4.4  --   --   --  3,728  HGB 9.7* 9.3*   < > 10.5* 10.5* 10.5*  HCT 29.9*  27.7*   < > 31.9* 32.8* 32.2*  MCV 91.7 90.2   < > 96.4 98.5 96.1  PLT 223 193   < > 239 223 225   < > = values in this interval not displayed.   Lipid Panel: No results for input(s): CHOL, HDL, LDLCALC, TRIG, CHOLHDL, LDLDIRECT in  the last 8760 hours. TSH: Recent Labs    07/07/19 0123 08/28/19 0101  TSH 1.623 1.428   A1C: Lab Results  Component Value Date   HGBA1C 6.2 (H) 04/30/2020     Assessment/Plan 1. Persistent atrial fibrillation (HCC) Rate controlled. Continues on couamdin for anticoaguation.   2. Warfarin anticoagulation -INR was not increasing despite increasing coumadin.  -at last visit instructed to increase coumadin to 7.5 mg daily and rechecked today - POC INR at 1.4 which is up from 1.0. will have her continue 7.5 mg daily until follow up with coumadin clinic on friday.   3. PEG (percutaneous endoscopic gastrostomy) status ([place]) -previously had drainage bag due to overflow and leaking around site. Now with out leaking. She daughter has left the drainage bag off at this time. -since she is not losing volume/water this way will decrease free water to 240 cc TID which was previously recommended. We will follow up lab in 1.5 week to recheck renal function and electrolytes at this time. - BASIC METABOLIC PANEL WITH GFR; Future  4. Malnutrition of moderate degree (HCC) -stable, continues to get nutrition with PEG. Now without leaking around site.  - BASIC METABOLIC PANEL WITH GFR; Future  Next appt: 2 weeks for follow p Haide Klinker K. [place], [facility] [phone]

## 2020-05-17 NOTE — Patient Instructions (Signed)
Continue on coumadin 7.5 mg daily until you see coumadin clinic on Friday.   Follow up March 2nd for LAB

## 2020-05-18 ENCOUNTER — Ambulatory Visit: Payer: Self-pay | Admitting: *Deleted

## 2020-05-18 ENCOUNTER — Other Ambulatory Visit: Payer: Self-pay | Admitting: Cardiology

## 2020-05-21 ENCOUNTER — Ambulatory Visit (INDEPENDENT_AMBULATORY_CARE_PROVIDER_SITE_OTHER): Payer: Medicare Other

## 2020-05-21 ENCOUNTER — Other Ambulatory Visit: Payer: Self-pay

## 2020-05-21 DIAGNOSIS — I4892 Unspecified atrial flutter: Secondary | ICD-10-CM

## 2020-05-21 DIAGNOSIS — Z5181 Encounter for therapeutic drug level monitoring: Secondary | ICD-10-CM

## 2020-05-21 DIAGNOSIS — I4891 Unspecified atrial fibrillation: Secondary | ICD-10-CM | POA: Diagnosis not present

## 2020-05-21 LAB — POCT INR: INR: 2.1 (ref 2.0–3.0)

## 2020-05-21 NOTE — Patient Instructions (Signed)
Description   Continue on same dosage 7.5mg  daily.  Recheck in 1 week.  Call Coumadin Clinic with any new medications [phone].

## 2020-05-28 ENCOUNTER — Ambulatory Visit (INDEPENDENT_AMBULATORY_CARE_PROVIDER_SITE_OTHER): Payer: Medicare Other

## 2020-05-28 ENCOUNTER — Other Ambulatory Visit: Payer: Self-pay

## 2020-05-28 DIAGNOSIS — I4892 Unspecified atrial flutter: Secondary | ICD-10-CM | POA: Diagnosis not present

## 2020-05-28 DIAGNOSIS — I4891 Unspecified atrial fibrillation: Secondary | ICD-10-CM | POA: Diagnosis not present

## 2020-05-28 DIAGNOSIS — Z5181 Encounter for therapeutic drug level monitoring: Secondary | ICD-10-CM

## 2020-05-28 LAB — POCT INR: INR: 3.8 — AB (ref 2.0–3.0)

## 2020-05-28 NOTE — Patient Instructions (Signed)
-   skip warfarin tonight, then - resume previous dosage of warfarin 2 tablets daily except 3 tablets on Sundays, Tuesdays and Thursdays.  -Recheck in 2 weeks.   Call Coumadin Clinic with any new medications [phone].

## 2020-06-02 ENCOUNTER — Other Ambulatory Visit: Payer: Medicare Other

## 2020-06-02 ENCOUNTER — Other Ambulatory Visit: Payer: Self-pay

## 2020-06-02 DIAGNOSIS — Z931 Gastrostomy status: Secondary | ICD-10-CM

## 2020-06-02 DIAGNOSIS — E440 Moderate protein-calorie malnutrition: Secondary | ICD-10-CM

## 2020-06-02 LAB — BASIC METABOLIC PANEL WITH GFR
BUN/Creatinine Ratio: 36 (calc) — ABNORMAL HIGH (ref 6–22)
BUN: 52 mg/dL — ABNORMAL HIGH (ref 7–25)
CO2: 26 mmol/L (ref 20–32)
Calcium: 9.4 mg/dL (ref 8.6–10.4)
Chloride: 99 mmol/L (ref 98–110)
Creat: 1.43 mg/dL — ABNORMAL HIGH (ref 0.60–0.88)
GFR, Est African American: 36 mL/min/{1.73_m2} — ABNORMAL LOW (ref >=60)
GFR, Est Non African American: 31 mL/min/{1.73_m2} — ABNORMAL LOW (ref >=60)
Glucose, Bld: 102 mg/dL — ABNORMAL HIGH (ref 65–99)
Potassium: 4.5 mmol/L (ref 3.5–5.3)
Sodium: 134 mmol/L — ABNORMAL LOW (ref 135–146)

## 2020-06-04 ENCOUNTER — Telehealth: Payer: Self-pay

## 2020-06-04 NOTE — Telephone Encounter (Signed)
Pt daughters states that her mother needs a prescription silvadene cream. Please advise

## 2020-06-07 DIAGNOSIS — H16223 Keratoconjunctivitis sicca, not specified as Sjogren's, bilateral: Secondary | ICD-10-CM | POA: Diagnosis not present

## 2020-06-08 ENCOUNTER — Other Ambulatory Visit: Payer: Self-pay | Admitting: *Deleted

## 2020-06-08 NOTE — Patient Instructions (Signed)
Water Safety for Feeding Tubes People with a feeding tube need water:  To supply moisture to the body (stay hydrated).  To clear the tube before and after giving medicines.  To prepare powdered formula.  To mix medicines. It is important to know what types of water are safest to use and when each type of water should be used. People with a feeding tube may be more sensitive to germs and chemicals (contaminants) in water than healthy people are. Talk to your health care team if you have concerns about water quality or if you are unable to access safe water for feeding tube use. Types of water Sterile water This type of water has been cleaned (purified) to remove any contaminants. It is sold in bottles at pharmacies and grocery stores. Purified water This type of water is treated using one or more methods, such as distilling, filtering, or using ultraviolet (UV) light. It is sold in bottles at most stores and has the word "purified" on the label. You can also purify water at home by boiling non-purified bottled water or water from the faucet. Drinking water This type of water includes any water that is safe for typical drinking and food preparation uses. This water includes regular bottled water and water from the faucet. Source water This type of water includes freshwater in lakes, reservoirs, rivers, or streams. It also includes groundwater (aquifers). Do not use source water with a feeding tube. How to choose a type of water to use  If you need to give water through a feeding tube to keep urine pale yellow (stay hydrated), you may use: ? Sterile water. ? Purified water. ? Drinking water.  If you need to flush the feeding tube before and after giving medicines, you may use: ? Sterile water. ? Purified water.  If you need to mix powdered formula for a feeding, you may use: ? Sterile water. If possible, use only sterile water when preparing formula. ? Purified water.  If you need  water to make medicines thinner (dilute them) to give them through a feeding tube, you may use: ? Sterile water. ? Purified water. Follow these instructions at home:  Use only sterile water with your feeding tube if you have a weak disease-fighting system (immunesystem) and have trouble fighting against infections (are immunocompromised).  Use sterile water if you are unsure about the amount of contaminants in purified water or drinking water.  Do not use freshwater found in lakes, rivers, reservoirs, or aquifers unless you have treated or filtered it first.  To purify drinking water by boiling: ? Boil water for 1 minute or longer. Keep a lid over the water while it boils. ? Let the water cool to room temperature before you use it.   Summary  Let your health care team know if you have concerns about your water quality or if you are unable to access safe water for feeding tube use. This information is not intended to replace advice given to you by your health care provider. Make sure you discuss any questions you have with your health care provider. Document Revised: 09/30/2019 Document Reviewed: 09/30/2019 Elsevier Patient Education  [place].

## 2020-06-08 NOTE — Patient Outreach (Signed)
[facility]) Care Management  [facility] Care Manager  06/08/2020   Shannon [name] 1923/06/13 [id number]   Incoming call received back from member.  State her foot has improved, will follow up with podiatry on 3/16.  She is in need of more cream for daily foot care, daughter has called to office to request refill.  She was able to get her feeding tube replaced without issues, no further leaking, follow up with PCP scheduled for tomorrow.  Report blood sugar today was 157, state she is still working on improving these numbers.  She is watching her sugar intake and increasing water.  Denies any urgent concerns, encouraged to contact this care manager with questions.    Encounter Medications:  Outpatient Encounter Medications as of 06/08/2020  Medication Sig  . acetaminophen (TYLENOL) 500 MG tablet Take 500 mg by mouth every 6 (six) hours as needed.  [name] amLODipine (NORVASC) 5 MG tablet Take 1 tablet (5 mg total) by mouth daily.  [name] atorvastatin (LIPITOR) 10 MG tablet TAKE 1 TABLET BY MOUTH EVERY DAY  . b complex vitamins tablet Take 1 tablet by mouth daily.   . benzocaine (AMERICAINE) 20 % rectal ointment Place rectally every 3 (three) hours as needed for pain.  . calcium-vitamin D (OSCAL WITH D) 500-200 MG-UNIT per tablet Take 1 tablet by mouth daily with breakfast.   . Carboxymethylcellulose Sodium (ARTIFICIAL TEARS OP) Place 1 drop into both eyes 4 (four) times daily.  . cycloSPORINE (RESTASIS) 0.05 % ophthalmic emulsion USE 1 DROP INTO BOTH EYES TWICE DAILY  . DULERA 200-5 MCG/ACT AERO TAKE TWO PUFFS BY MOUTH TWICE DAILY  . DULoxetine (CYMBALTA) 30 MG capsule TAKE 1 CAPSULE BY MOUTH EVERY DAY  . ferrous sulfate 220 (44 Fe) MG/5ML solution TAKE 7.4 MLS (325 MG TOTAL) BY MOUTH DAILY.  . fluticasone (FLONASE) 50 MCG/ACT nasal spray SPRAY 2 SPRAYS INTO EACH NOSTRIL EVERY DAY  . furosemide (LASIX) 40 MG tablet Take 0.5-1 tablets (20-40 mg total) by mouth See admin instructions. Take 20 mg  daily, may instead take 40 mg daily as needed for edema  . gabapentin (NEURONTIN) 300 MG capsule Take 1 capsule (300 mg total) by mouth 2 (two) times daily.  [name] gentamicin cream (GARAMYCIN) 0.1 % Apply 1 application topically daily as needed (dry skin).  . hydrALAZINE (APRESOLINE) 50 MG tablet TAKE 1 TABLET BY MOUTH THREE TIMES A DAY  . insulin aspart (NOVOLOG) 100 UNIT/ML FlexPen Inject into the skin as needed for high blood sugar. New sliding Scale If BS is 151 - 251 = 2 units,  If BS is 251 -  350 = 4 units,  If BS is 351 - 450 = 6 units Call provider if BS runs more than 400  . Insulin Pen Needle (B-D UF III MINI PEN NEEDLES) 31G X 5 MM MISC Use twice daily with giving insulin injections. Dx E11.9  . ipratropium-albuterol (DUONEB) 0.5-2.5 (3) MG/3ML SOLN Take 3 mLs by nebulization every 6 (six) hours as needed (shortness of breath).  . isosorbide mononitrate (IMDUR) 30 MG 24 hr tablet TAKE 2 TABLETS BY MOUTH DAILY. PLEASE MAKE ANNUAL APPT WITH DR. Delton See FOR FUTURE REFILLS  . LANTUS SOLOSTAR 100 UNIT/ML Solostar Pen INJECT 8 UNITS INTO THE SKIN AT BEDTIME.  [name] latanoprost (XALATAN) 0.005 % ophthalmic solution 1 drop at bedtime.  . levalbuterol (XOPENEX HFA) 45 MCG/ACT inhaler Inhale 2 puffs into the lungs every 6 (six) hours as needed for wheezing.  [name] levothyroxine (SYNTHROID) 125  MCG tablet TAKE 1 TABLET BY MOUTH EVERY DAY  . loratadine (CLARITIN) 10 MG tablet Take 10 mg by mouth daily as needed for allergies.  . Multiple Vitamin (MULTIVITAMIN WITH MINERALS) TABS tablet Take 1 tablet by mouth daily.  . Nutritional Supplements (FEEDING SUPPLEMENT, OSMOLITE 1.5 CAL,) LIQD Place 711 mLs into feeding tube See admin instructions. Take 711 ml over a 12 hour period per day  . nystatin (MYCOSTATIN) 100000 UNIT/ML suspension Take 5 mLs (500,000 Units total) by mouth 4 (four) times daily.  [name] nystatin (MYCOSTATIN/NYSTOP) powder Apply 1 application topically as needed (skin irritation).  . nystatin cream  (MYCOSTATIN) APPLY TO AFFECTED AREA TWICE A DAY  . oxyCODONE (ROXICODONE) 5 MG immediate release tablet Take 1 tablet (5 mg total) by mouth every 4 (four) hours as needed for severe pain. May take 1/2 tab q4hrs as needed for mild pain.  . pantoprazole (PROTONIX) 40 MG tablet TAKE 1 TABLET BY MOUTH EVERY DAY  . polyethylene glycol (MIRALAX / GLYCOLAX) 17 g packet Take 17 g by mouth daily as needed for mild constipation.  . Probiotic Product (EQL PROBIOTIC COLON SUPPORT PO) Take 1 capsule by mouth daily.  [name] senna-docusate (SENOKOT-S) 8.6-50 MG tablet Take 1 tablet by mouth as needed for mild constipation.  [name] warfarin (COUMADIN) 2.5 MG tablet TAKE 2-3 TABLETS BY MOUTH DAILY AS DIRECTED BY COUMADIN CLINIC.  [name] Water For Irrigation, Sterile (FREE WATER) SOLN Place 180 mLs into feeding tube 3 (three) times daily.   Clif[place]on Asters Petrolatum-Mineral Oil (SYSTANE NIGHTTIME) OINT Place 1 application into both eyes at bedtime.    No facility-administered encounter medications on file as of 06/08/2020.    Functional Status:  In your present state of health, do you have any difficulty performing the following activities: 04/29/2020 04/22/2020  Hearing? Y Y  Comment wears 2 hearing aides wears 2 hearing aides  Vision? N N  Difficulty concentrating or making decisions? N N  Walking or climbing stairs? Y Y  Dressing or bathing? N N  Doing errands, shopping? Y Y  Preparing Food and eating ? - -  Using the Toilet? - -  In the past six months, have you accidently leaked urine? - -  Do you have problems with loss of bowel control? - -  Managing your Medications? - -  Comment - -  Managing your Finances? - -  Housekeeping or managing your Housekeeping? - -  Comment - -  Some recent data might be hidden    Fall/Depression Screening: Fall Risk  11/07/2019 10/21/2019 10/20/2019  Falls in the past year? 0 0 0  Number falls in past yr: 0 0 0  Injury with Fall? 0 0 -  Risk for fall due to : - - -  Follow up - - -    PHQ 2/9 Scores 10/21/2019 07/21/2019 10/10/2018 08/07/2018 10/05/2017 02/28/2017 10/25/2016  PHQ - 2 Score 0 0 0 0 0 0 -  Exception Documentation - - - - - - Other- indicate reason in comment box  Not completed - - - - - - call completed with dtr    Assessment:  Goals Addressed            This Visit's Progress   . Remove Barriers to Better Nutrition   On track    Timeframe:  Short-Term Goal Priority:  Medium Start Date:          05/10/2020  Expected End Date:      07/09/2020                 Follow Up Date 07/06/2020    - make food easier to swallow by adding gravy or drinking water between bites - take my supplements as prescribed    Why is this important?    Eating healthy is not always easy.   You may not feel hungry or food might not taste good.   It may be a lot of work to shop or make meals.   There are things you can do to make it easier.    Notes:   2/7 - feeding tube to be replaced on 2/9  3/8 - Confirmed tube is replaced, no leaking    . [facility] - Make and Keep All Appointments   On track    Follow Up Date 07/06/2020  Timeframe:  Short-Term Goal Priority:  Medium Start Date:       2/7                 Expected End Date:     3/8                    - ask family or friend for a ride - call to cancel if needed - keep a calendar with appointment dates    Why is this important?   Part of staying healthy is seeing the doctor for follow-up care.  If you forget your appointments, there are some things you can do to stay on track.    Notes:   11/22 - Upcoming appointments reviewed with member  12/21 - reminded of follow up with podiatrist  2/7 - Reviewed upcoming appointments with member, PCP on 3/9, podiatrist on 3/16    . [facility] - Perform Foot Care   On track    Follow Up Date 06/08/2020  Timeframe:  Long-Range Goal Priority:  High Start Date:        2/7                  Expected End Date:    4/7                - check feet daily for cuts, sores or  redness - wash and dry feet carefully every day - wear comfortable, cotton socks - wear comfortable, well-fitting shoes    Why is this important?   Good foot care is very important when you have diabetes.  There are many things you can do to keep your feet healthy and catch a problem early.    Notes:   11/22 - Reviewed updated wound care per most recent MD visit  12/21 - Discussed ongoing wound care with member  2/7 - Reminded of wound care per podiatrist, encouraged to keep wound clean and dry        Plan:  Follow-up:  Patient agrees to Care Plan and Follow-up.  Will follow up within the next month.  Kemper Durie, [place], MSN [facility] Care Management  [facility] Manager [phone]

## 2020-06-08 NOTE — Patient Outreach (Signed)
[facility]) Care Management  06/08/2020  EMARI HREHA 01-Apr-1924 [id number]   Outgoing call placed to member, no answer, unable to leave message as mailbox is full.  Will follow up within the next 3-4 business days.  [name], [place], MSN [place] [phone]

## 2020-06-09 ENCOUNTER — Encounter: Payer: Self-pay | Admitting: Nurse Practitioner

## 2020-06-09 ENCOUNTER — Other Ambulatory Visit: Payer: Self-pay

## 2020-06-09 ENCOUNTER — Ambulatory Visit (INDEPENDENT_AMBULATORY_CARE_PROVIDER_SITE_OTHER): Payer: Medicare Other | Admitting: Nurse Practitioner

## 2020-06-09 VITALS — BP 130/80 | HR 67 | Temp 97.0°F | Ht 66.0 in | Wt 132.6 lb

## 2020-06-09 DIAGNOSIS — E039 Hypothyroidism, unspecified: Secondary | ICD-10-CM

## 2020-06-09 DIAGNOSIS — I4819 Other persistent atrial fibrillation: Secondary | ICD-10-CM

## 2020-06-09 DIAGNOSIS — E782 Mixed hyperlipidemia: Secondary | ICD-10-CM

## 2020-06-09 DIAGNOSIS — E1142 Type 2 diabetes mellitus with diabetic polyneuropathy: Secondary | ICD-10-CM

## 2020-06-09 DIAGNOSIS — Z89431 Acquired absence of right foot: Secondary | ICD-10-CM | POA: Diagnosis not present

## 2020-06-09 DIAGNOSIS — F325 Major depressive disorder, single episode, in full remission: Secondary | ICD-10-CM | POA: Diagnosis not present

## 2020-06-09 DIAGNOSIS — I83015 Varicose veins of right lower extremity with ulcer other part of foot: Secondary | ICD-10-CM

## 2020-06-09 DIAGNOSIS — L97519 Non-pressure chronic ulcer of other part of right foot with unspecified severity: Secondary | ICD-10-CM

## 2020-06-09 DIAGNOSIS — E440 Moderate protein-calorie malnutrition: Secondary | ICD-10-CM

## 2020-06-09 DIAGNOSIS — I10 Essential (primary) hypertension: Secondary | ICD-10-CM | POA: Diagnosis not present

## 2020-06-09 DIAGNOSIS — I5032 Chronic diastolic (congestive) heart failure: Secondary | ICD-10-CM

## 2020-06-09 DIAGNOSIS — Z931 Gastrostomy status: Secondary | ICD-10-CM | POA: Diagnosis not present

## 2020-06-09 DIAGNOSIS — J449 Chronic obstructive pulmonary disease, unspecified: Secondary | ICD-10-CM

## 2020-06-09 NOTE — Progress Notes (Signed)
Careteam: Patient Care Team: [name], NP as PCP - General (Geriatric Medicine) Dorothy Spark, MD as PCP - Cardiology (Cardiology) Edrick Kins, DPM as Consulting Physician (Podiatry) Dorothy Spark, MD as Consulting Physician (Cardiology) [name], NP as Nurse Practitioner (Geriatric Medicine) Camillo Flaming, [place] as Referring Physician (Optometry) [name], RN as [place] Management  PLACE OF SERVICE:  [place] Directive information Does Patient Have a Medical Advance Directive?: Yes, Type of Advance Directive: Healthcare Power of Attorney, Does patient want to make changes to medical advance directive?: No - Patient declined  Allergies  Allergen Reactions  . Penicillins Itching, Rash and Other (See Comments)    Tolerated amoxicillin, unasyn, zosyn & cephalosporins in the past. Did it involve swelling of the face/tongue/throat, SOB, or low BP? No Did it involve sudden or severe rash/hives, skin peeling, or any reaction on the inside of your mouth or nose? No Did you need to seek medical attention at a hospital or doctor's office? Unknown When did it last happen?5+ years If all above answers are "NO", may proceed with cephalosporin use.     Chief Complaint  Patient presents with  . Medical Management of Chronic Issues    4 month follow up. Discuss need for 2nd COVID vaccine.     HPI: [name] for follow up.  A fib- on coumadin- over the last month with difficulty getting to therapeutic range- INR was low then last INR elevated, she has follow up in 2 days with coumadin clinic.  No signs of abnormal briusing or bleeding.   Tube feeding- followed by nutritionist- recent sodium levels trending down- labs sent to nutritionist who called daughter but they have not called her back. She is now doing 240 TID.   Doing well, not overflowing like it use to.  Sometimes will have issues with  constipation but does not like using medication.   COPD- stable on dulera  DM- A1c at goal. Continues on novolog and lantus. no low blood sugars. Blood sugar continue to be well control.   Depression- continues on cymbalta  LE edema- gave double dose of lasix last week.   Neuropathy-     Review of Systems:  Review of Systems  Constitutional: Negative for chills, fever and weight loss.  HENT: Negative for tinnitus.   Respiratory: Negative for cough, sputum production and shortness of breath.   Cardiovascular: Negative for chest pain, palpitations and leg swelling.  Gastrointestinal: Positive for constipation (occasionally). Negative for abdominal pain, diarrhea and heartburn.  Genitourinary: Negative for dysuria, frequency and urgency.  Musculoskeletal: Negative for back pain, falls, joint pain and myalgias.  Skin: Negative.   Neurological: Positive for tingling. Negative for dizziness and headaches.  Psychiatric/Behavioral: Negative for depression and memory loss. The patient does not have insomnia.     Past Medical History:  Diagnosis Date  . Acute respiratory failure ([place])   . Anemia    previous blood transfusions  . Arthritis    "all over"  . Asthma   . Atrial flutter ([place])   . Bradycardia    requiring previous d/c of BB and reduction of amiodarone  . CAD (coronary artery disease)    nonobstructive per notes  . Chronic diastolic CHF (congestive heart failure) ([place])   . CKD (chronic kidney disease), stage III ([place])   . Complication of blood transfusion    "got the wrong blood type at [facility] in ~  2015; no adverse reaction that we are aware of"/daughter, [name] (01/27/2016)  . COPD (chronic obstructive pulmonary disease) ([place])   . Depression    "light case"  . DVT (deep venous thrombosis) ([place]) 01/2016   a. LLE DVT 01/2016 - switched from Eliquis to Coumadin.  [name] Dyspnea    with some activity  . Gastric stenosis    a. s/p stomach tube  . GERD (gastroesophageal  reflux disease)   . Headache   . History of 2019 novel coronavirus disease (COVID-19)   . History of blood transfusion    "several" (01/27/2016)  . History of stomach ulcers   . Hyperlipidemia   . Hypertension   . Hypothyroidism   . On home oxygen therapy    "2L; 9PM til 9AM" (06/27/2017)  . PAD (peripheral artery disease) ([place])    a. prior gangrene, toe amputations, intervention.  [name] PAF (paroxysmal atrial fibrillation) ([place])   . Paraesophageal hernia   . Perforated gastric ulcer ([place])   . Pneumonia    "a few times" (06/27/2017)  . Seasonal allergies   . SIADH (syndrome of inappropriate ADH production) ([place])    [name] 01/10/2015  . Small bowel obstruction ([place])    "I don't know how many" (01/11/2015)  . Stroke [facility])    several- no residual  . Type II diabetes mellitus ([place])    "related to prednisone use  for > 20 yr; once predinose stopped; no more DM RX" (01/27/2016)  . UTI (urinary tract infection) 02/08/2016  . Ventral hernia with bowel obstruction    Past Surgical History:  Procedure Laterality Date  . ABDOMINAL AORTOGRAM N/A 09/21/2016   Procedure: Abdominal Aortogram;  Surgeon: Waynetta Sandy, MD;  Location: [facility];  Service: Cardiovascular;  Laterality: N/A;  . AMPUTATION Left 09/25/2016   Procedure: AMPUTATION DIGIT- LEFT 2ND AND 3RD TOES;  Surgeon: Rosetta Posner, MD;  Location: [facility];  Service: Vascular;  Laterality: Left;  . AMPUTATION Right 05/03/2018   Procedure: AMPUTATION RIGHT GREAT TOE;  Surgeon: Rosetta Posner, MD;  Location: [facility];  Service: Vascular;  Laterality: Right;  . CATARACT EXTRACTION W/ INTRAOCULAR LENS  IMPLANT, BILATERAL    . CHOLECYSTECTOMY OPEN    . COLECTOMY    . ESOPHAGOGASTRODUODENOSCOPY N/A 01/19/2014   Procedure: ESOPHAGOGASTRODUODENOSCOPY (EGD);  Surgeon: Irene Shipper, MD;  [facility];  Service: Endoscopy;  Laterality: N/A;  . ESOPHAGOGASTRODUODENOSCOPY N/A 01/20/2014   Procedure: ESOPHAGOGASTRODUODENOSCOPY  (EGD);  Surgeon: Irene Shipper, MD;  Location[facility];  Service: Endoscopy;  Laterality: N/A;  . ESOPHAGOGASTRODUODENOSCOPY N/A 03/19/2014   Procedure: ESOPHAGOGASTRODUODENOSCOPY (EGD);  Surgeon: Milus Banister, MD;  Location: [facility];  Service: Endoscopy;  Laterality: N/A;  . ESOPHAGOGASTRODUODENOSCOPY N/A 07/08/2015   Procedure: ESOPHAGOGASTRODUODENOSCOPY (EGD);  Surgeon: Doran Stabler, MD;  Location[facility];  Service: Endoscopy;  Laterality: N/A;  . ESOPHAGOGASTRODUODENOSCOPY (EGD) WITH PROPOFOL N/A 09/15/2015   Procedure: ESOPHAGOGASTRODUODENOSCOPY (EGD) WITH PROPOFOL;  Surgeon: Manus Gunning, MD;  [facility];  Service: Gastroenterology;  Laterality: N/A;  . GASTROJEJUNOSTOMY     hx/notes 01/10/2015  . GASTROJEJUNOSTOMY N/A 09/23/2015   Procedure: OPEN GASTROJEJUNOSTOMY TUBE PLACEMENT;  Surgeon: Arta Bruce Kinsinger, MD;  Location: [facility];  Service: General;  Laterality: N/A;  . GLAUCOMA SURGERY Bilateral   . HERNIA REPAIR  2015  . IR CM INJ ANY COLONIC TUBE W/FLUORO  01/05/2017  . IR CM INJ ANY COLONIC TUBE W/FLUORO  06/07/2017  . IR CM INJ ANY  COLONIC TUBE W/FLUORO  11/05/2017  . IR CM INJ ANY COLONIC TUBE W/FLUORO  09/18/2018  . IR CM INJ ANY COLONIC TUBE W/FLUORO  03/06/2019  . IR CM INJ ANY COLONIC TUBE W/FLUORO  03/11/2019  . IR GASTR TUBE CONVERT GASTR-JEJ PER W/FL MOD SED  04/30/2020  . IR GENERIC HISTORICAL  01/07/2016   IR GJ TUBE CHANGE 01/07/2016 Jacqulynn Cadet, MD [facility]  . IR GENERIC HISTORICAL  01/27/2016   IR MECH REMOV OBSTRUC MAT ANY COLON TUBE W/FLUORO 01/27/2016 Markus Daft, MD [facility]  . IR GENERIC HISTORICAL  02/07/2016   IR PATIENT EVAL TECH 0-60 MINS Darrell K Allred, PA-C [facility]  . IR GENERIC HISTORICAL  02/08/2016   IR GJ TUBE CHANGE 02/08/2016 Greggory Keen, MD [facility]  . IR GENERIC HISTORICAL  01/06/2016   IR GJ TUBE CHANGE 01/06/2016 [facility] OUT REF  . IR GENERIC HISTORICAL  05/02/2016   IR CM INJ ANY  COLONIC TUBE W/FLUORO 05/02/2016 Arne Cleveland, MD [facility]  . IR GENERIC HISTORICAL  05/15/2016   IR GJ TUBE CHANGE 05/15/2016 Sandi Mariscal, MD [facility]  . IR GENERIC HISTORICAL  06/28/2016   IR GJ TUBE CHANGE 06/28/2016 [facility]  . IR GJ TUBE CHANGE  02/20/2017  . IR GJ TUBE CHANGE  05/10/2017  . IR GJ TUBE CHANGE  07/06/2017  . IR GJ TUBE CHANGE  08/02/2017  . IR GJ TUBE CHANGE  10/15/2017  . IR GJ TUBE CHANGE  12/26/2017  . IR [place] TUBE CHANGE  04/08/2018  . IR [place] TUBE CHANGE  06/19/2018  . IR [place] TUBE CHANGE  03/03/2019  . IR [place] TUBE CHANGE  06/06/2019  . IR GJ TUBE CHANGE  09/26/2019  . IR GJ TUBE CHANGE  01/09/2020  . IR GJ TUBE CHANGE  05/12/2020  . IR PATIENT EVAL TECH 0-60 MINS  10/19/2016  . IR PATIENT EVAL TECH 0-60 MINS  12/25/2016  . IR PATIENT EVAL TECH 0-60 MINS  01/29/2017  . IR PATIENT EVAL TECH 0-60 MINS  04/04/2017  . IR PATIENT EVAL TECH 0-60 MINS  04/30/2017  . IR PATIENT EVAL TECH 0-60 MINS  07/31/2017  . IR REPLC DUODEN/JEJUNO TUBE PERCUT W/FLUORO  11/14/2016  . LAPAROTOMY N/A 01/20/2015   Procedure: EXPLORATORY LAPAROTOMY;  Surgeon: Coralie Keens, MD;  Location: [facility];  Service: General;  Laterality: N/A;  . LOWER EXTREMITY ANGIOGRAPHY Left 09/21/2016   Procedure: Lower Extremity Angiography;  Surgeon: Waynetta Sandy, MD;  Location: [facility];  Service: Cardiovascular;  Laterality: Left;  . LOWER EXTREMITY ANGIOGRAPHY Right 06/27/2017   Procedure: Lower Extremity Angiography;  Surgeon: Waynetta Sandy, MD;  Location: [facility];  Service: Cardiovascular;  Laterality: Right;  . LYSIS OF ADHESION N/A 01/20/2015   Procedure: LYSIS OF ADHESIONS < 1 HOUR;  Surgeon: Coralie Keens, MD;  Location: [facility];  Service: General;  Laterality: N/A;  . PERIPHERAL VASCULAR BALLOON ANGIOPLASTY Left 09/21/2016   Procedure: Peripheral Vascular Balloon Angioplasty;  Surgeon: Waynetta Sandy, MD;  Location: [facility];  Service:  Cardiovascular;  Laterality: Left;  drug coated balloon  . PERIPHERAL VASCULAR BALLOON ANGIOPLASTY Right 06/27/2017   Procedure: PERIPHERAL VASCULAR BALLOON ANGIOPLASTY;  Surgeon: Waynetta Sandy, MD;  Location: [facility];  Service: Cardiovascular;  Laterality: Right;  SFA/TPTRUNK  . TONSILLECTOMY    . TRANSMETATARSAL AMPUTATION Right 11/14/2019   Procedure: TRANSMETATARSAL AMPUTATION RIGHT FOOT;  Surgeon: Edrick Kins, DPM;  Location:  [facility];  Service: Podiatry;  Laterality: Right;  . TUBAL LIGATION    . VENTRAL HERNIA REPAIR  2015   incarcerated ventral hernia ([facility] 09/2013)/notes 01/10/2015   Social History:   reports that she has never smoked. She quit smokeless tobacco use about 40 years ago.  Her smokeless tobacco use included snuff. She reports that she does not drink alcohol and does not use drugs.  Family History  Problem Relation Age of Onset  . Stroke Mother   . Hypertension Mother   . Diabetes Brother   . Glaucoma Son   . Glaucoma Son   . Heart attack Neg Hx   . Colon cancer Neg Hx   . Stomach cancer Neg Hx     Medications: Patient's Medications  New Prescriptions   No medications on file  Previous Medications   ACETAMINOPHEN (TYLENOL) 500 MG TABLET    Take 500 mg by mouth every 6 (six) hours as needed.   AMLODIPINE (NORVASC) 5 MG TABLET    Take 1 tablet (5 mg total) by mouth daily.   ATORVASTATIN (LIPITOR) 10 MG TABLET    TAKE 1 TABLET BY MOUTH EVERY DAY   B COMPLEX VITAMINS TABLET    Take 1 tablet by mouth daily.    BENZOCAINE (AMERICAINE) 20 % RECTAL OINTMENT    Place rectally every 3 (three) hours as needed for pain.   CALCIUM-VITAMIN D (OSCAL WITH D) 500-200 MG-UNIT PER TABLET    Take 1 tablet by mouth daily with breakfast.    CARBOXYMETHYLCELLULOSE SODIUM (ARTIFICIAL TEARS OP)    Place 1 drop into both eyes 4 (four) times daily.   CYCLOSPORINE (RESTASIS) 0.05 % OPHTHALMIC EMULSION    USE 1 DROP INTO BOTH EYES TWICE DAILY   DULERA 200-5 MCG/ACT  AERO    TAKE TWO PUFFS BY MOUTH TWICE DAILY   DULOXETINE (CYMBALTA) 30 MG CAPSULE    TAKE 1 CAPSULE BY MOUTH EVERY DAY   FERROUS SULFATE 220 (44 FE) MG/5ML SOLUTION    TAKE 7.4 MLS (325 MG TOTAL) BY MOUTH DAILY.   FLUTICASONE (FLONASE) 50 MCG/ACT NASAL SPRAY    SPRAY 2 SPRAYS INTO EACH NOSTRIL EVERY DAY   FUROSEMIDE (LASIX) 40 MG TABLET    Take 0.5-1 tablets (20-40 mg total) by mouth See admin instructions. Take 20 mg daily, may instead take 40 mg daily as needed for edema   GABAPENTIN (NEURONTIN) 300 MG CAPSULE    Take 1 capsule (300 mg total) by mouth 2 (two) times daily.   GENTAMICIN CREAM (GARAMYCIN) 0.1 %    Apply 1 application topically daily as needed (dry skin).   HYDRALAZINE (APRESOLINE) 50 MG TABLET    TAKE 1 TABLET BY MOUTH THREE TIMES A DAY   INSULIN ASPART (NOVOLOG) 100 UNIT/ML FLEXPEN    Inject into the skin as needed for high blood sugar. New sliding Scale If BS is 151 - 251 = 2 units,  If BS is 251 -  350 = 4 units,  If BS is 351 - 450 = 6 units Call provider if BS runs more than 400   INSULIN PEN NEEDLE (B-D UF III MINI PEN NEEDLES) 31G X 5 MM MISC    Use twice daily with giving insulin injections. Dx E11.9   IPRATROPIUM-ALBUTEROL (DUONEB) 0.5-2.5 (3) MG/3ML SOLN    Take 3 mLs by nebulization every 6 (six) hours as needed (shortness of breath).   ISOSORBIDE MONONITRATE (IMDUR) 30 MG 24 HR TABLET    TAKE 2 TABLETS BY  MOUTH DAILY. PLEASE MAKE ANNUAL APPT WITH DR. Meda Coffee FOR FUTURE REFILLS   LANTUS SOLOSTAR 100 UNIT/ML SOLOSTAR PEN    INJECT 8 UNITS INTO THE SKIN AT BEDTIME.   LATANOPROST (XALATAN) 0.005 % OPHTHALMIC SOLUTION    1 drop at bedtime.   LEVALBUTEROL (XOPENEX HFA) 45 MCG/ACT INHALER    Inhale 2 puffs into the lungs every 6 (six) hours as needed for wheezing.   LEVOTHYROXINE (SYNTHROID) 125 MCG TABLET    TAKE 1 TABLET BY MOUTH EVERY DAY   LORATADINE (CLARITIN) 10 MG TABLET    Take 10 mg by mouth daily as needed for allergies.   MULTIPLE VITAMIN (MULTIVITAMIN WITH MINERALS)  TABS TABLET    Take 1 tablet by mouth daily.   NUTRITIONAL SUPPLEMENTS (FEEDING SUPPLEMENT, OSMOLITE 1.5 CAL,) LIQD    Place 711 mLs into feeding tube See admin instructions. Take 711 ml over a 12 hour period per day   NYSTATIN (MYCOSTATIN) 100000 UNIT/ML SUSPENSION    Take 5 mLs (500,000 Units total) by mouth 4 (four) times daily.   NYSTATIN (MYCOSTATIN/NYSTOP) POWDER    Apply 1 application topically as needed (skin irritation).   NYSTATIN CREAM (MYCOSTATIN)    APPLY TO AFFECTED AREA TWICE A DAY   OXYCODONE (ROXICODONE) 5 MG IMMEDIATE RELEASE TABLET    Take 1 tablet (5 mg total) by mouth every 4 (four) hours as needed for severe pain. May take 1/2 tab q4hrs as needed for mild pain.   PANTOPRAZOLE (PROTONIX) 40 MG TABLET    TAKE 1 TABLET BY MOUTH EVERY DAY   POLYETHYLENE GLYCOL (MIRALAX / GLYCOLAX) 17 G PACKET    Take 17 g by mouth daily as needed for mild constipation.   PROBIOTIC PRODUCT (EQL PROBIOTIC COLON SUPPORT PO)    Take 1 capsule by mouth daily.   SENNA-DOCUSATE (SENOKOT-S) 8.6-50 MG TABLET    Take 1 tablet by mouth as needed for mild constipation.   WARFARIN (COUMADIN) 2.5 MG TABLET    TAKE 2-3 TABLETS BY MOUTH DAILY AS DIRECTED BY COUMADIN CLINIC.   WATER FOR IRRIGATION, STERILE (FREE WATER) SOLN    Place 180 mLs into feeding tube 3 (three) times daily.    WHITE PETROLATUM-MINERAL OIL (SYSTANE NIGHTTIME) OINT    Place 1 application into both eyes at bedtime.   Modified Medications   No medications on file  Discontinued Medications   No medications on file    Physical Exam:  Vitals:   06/09/20 1125  BP: 130/80  Pulse: 67  Temp: (!) 97 F (36.1 C)  TempSrc: Temporal  SpO2: 99%  Weight: 132 lb 9.6 oz (60.1 kg)  Height: '5\' 6"'  (1.676 m)   Body mass index is 21.4 kg/m. Wt Readings from Last 3 Encounters:  06/09/20 132 lb 9.6 oz (60.1 kg)  05/17/20 130 lb (59 kg)  05/14/20 128 lb 3.2 oz (58.2 kg)    Physical Exam Constitutional:      General: She is not in acute  distress.    Appearance: She is well-developed and well-nourished. She is not diaphoretic.  HENT:     Head: Normocephalic and atraumatic.     Mouth/Throat:     Mouth: Oropharynx is clear and moist.     Pharynx: No oropharyngeal exudate.  Eyes:     Conjunctiva/sclera: Conjunctivae normal.     Pupils: Pupils are equal, round, and reactive to light.  Cardiovascular:     Rate and Rhythm: Normal rate and regular rhythm.     Heart sounds: Normal  heart sounds.  Pulmonary:     Effort: Pulmonary effort is normal.     Breath sounds: Normal breath sounds.  Abdominal:     General: Bowel sounds are normal.     Palpations: Abdomen is soft.  Musculoskeletal:        General: No tenderness or edema.     Cervical back: Normal range of motion and neck supple.  Skin:    General: Skin is warm and dry.  Neurological:     Mental Status: She is alert and oriented to person, place, and time.  Psychiatric:        Mood and Affect: Mood and affect normal.     Labs reviewed: Basic Metabolic Panel: Recent Labs    07/07/19 0123 07/08/19 0539 08/27/19 2016 08/28/19 0101 09/11/19 1424 11/15/19 0506 04/22/20 1232 05/01/20 0635 05/07/20 1436 06/02/20 1126  NA  --    < >  --  137   < > 134*   < > 143 138 134*  K  --    < >  --  4.6   < > 4.3   < > 3.7 4.5 4.5  CL  --    < >  --  103   < > 100   < > 109 101 99  CO2  --    < >  --  23   < > 24   < > '22 25 26  ' GLUCOSE  --    < >  --  93   < > 162*   < > 164* 100 102*  BUN  --    < >  --  44*   < > 37*   < > 43* 48* 52*  CREATININE  --    < >  --  1.34*   < > 1.37*   < > 1.28* 1.53* 1.43*  CALCIUM  --    < >  --  9.7   < > 9.3   < > 9.0 9.6 9.4  MG  --   --  2.3  --   --  2.1  --   --   --   --   PHOS  --   --   --   --   --  4.4  --   --   --   --   TSH 1.623  --   --  1.428  --   --   --   --   --   --    < > = values in this interval not displayed.   Liver Function Tests: Recent Labs    04/29/20 1827 04/30/20 0538 05/01/20 0635  05/07/20 1436  AST 34 '31 31 25  ' ALT '24 22 20 20  ' ALKPHOS 77 73 65  --   BILITOT 1.1 0.8 0.6 0.5  PROT 8.5* 7.9 7.4 7.5  ALBUMIN 3.9 3.7 3.2*  --    Recent Labs    08/15/19 0519 09/24/19 2000 04/22/20 1232  LIPASE '30 24 25   ' No results for input(s): AMMONIA in the last 8760 hours. CBC: Recent Labs    11/07/19 1454 11/15/19 0506 04/22/20 1232 04/30/20 0538 05/01/20 0635 05/07/20 1436  WBC 5.1 4.9   < > 5.6 5.1 5.7  NEUTROABS 2,917 4.4  --   --   --  3,728  HGB 9.7* 9.3*   < > 10.5* 10.5* 10.5*  HCT 29.9* 27.7*   < > 31.9* 32.8* 32.2*  MCV  91.7 90.2   < > 96.4 98.5 96.1  PLT 223 193   < > 239 223 225   < > = values in this interval not displayed.   Lipid Panel: No results for input(s): CHOL, HDL, LDLCALC, TRIG, CHOLHDL, LDLDIRECT in the last 8760 hours. TSH: Recent Labs    07/07/19 0123 08/28/19 0101  TSH 1.623 1.428   A1C: Lab Results  Component Value Date   HGBA1C 6.2 (H) 04/30/2020     Assessment/Plan .1. Persistent atrial fibrillation (HCC) Rate controlled, continues on coumadin, last level was elevated and coumadin was adjusted, has follow up with INR clinic in 2 days.   2. PEG (percutaneous endoscopic gastrostomy) status ([place]) Doing well at this time. Plans to call nutritionist back in regards to recommendation for free water.   3. Malnutrition of moderate degree (HCC) -improving at this time. Continues on tube feeds.  4. COPD with asthma ([place]) Stable, no recent flares.   5. Type 2 diabetes mellitus with peripheral neuropathy (HCC) Blood sugars have been well controlled. A1c at goal. Will follow up A1c prior to next labs. - routine foot care/monitoring and to keep up with diabetic eye exams through ophthalmology   6. Diabetic polyneuropathy associated with type 2 diabetes mellitus (HCC) Stable on gabapentin and cymbalta.   7. History of transmetatarsal amputation of right foot ([place]) -followed by podiatry due foot ulcers.  8. Varicose veins  of right lower extremity with ulcer other part of foot ([place]) -stable, continues to be followed by podiatry. Ulcers stable without worsening of symptoms per daughter.  9. Depression, major, single episode, complete remission ([place]) Controlled on cymbalta, reports she is feeling very good at this time.   10. Essential hypertension, benign -well controlled on current regimen. - CMP with eGFR([facility]); Future - CBC with Differential/Platelet; Future  11. Chronic diastolic CHF (congestive heart failure) (HCC) Stable. Used lasix last week due to LE edema. This is stable now - CMP with eGFR([facility]); Future - CBC with Differential/Platelet; Future  12. Mixed hyperlipidemia -continues on atorvastatin.  - CMP with eGFR([facility]); Future - Lipid Panel; Future  13. Hypothyroid -TSH, future Continues on synthroid 125 mcg  Next appt:  78month labs prior  Tiya Schrupp K. E[facility] [phone]

## 2020-06-11 ENCOUNTER — Other Ambulatory Visit: Payer: Self-pay

## 2020-06-11 ENCOUNTER — Ambulatory Visit (INDEPENDENT_AMBULATORY_CARE_PROVIDER_SITE_OTHER): Payer: Medicare Other | Admitting: *Deleted

## 2020-06-11 DIAGNOSIS — I4891 Unspecified atrial fibrillation: Secondary | ICD-10-CM | POA: Diagnosis not present

## 2020-06-11 DIAGNOSIS — Z5181 Encounter for therapeutic drug level monitoring: Secondary | ICD-10-CM

## 2020-06-11 DIAGNOSIS — I4892 Unspecified atrial flutter: Secondary | ICD-10-CM | POA: Diagnosis not present

## 2020-06-11 LAB — POCT INR: INR: 2.9 (ref 2.0–3.0)

## 2020-06-11 NOTE — Patient Instructions (Addendum)
Description   Continue taking warfarin 2 tablets daily except for 3 tablets on Sundays, Tuesdays and Thursdays. Recheck Inr when you see Dr. Meda Coffee on 3/22. Coumadin Clinic [phone].

## 2020-06-16 ENCOUNTER — Other Ambulatory Visit: Payer: Self-pay

## 2020-06-16 ENCOUNTER — Ambulatory Visit (INDEPENDENT_AMBULATORY_CARE_PROVIDER_SITE_OTHER): Payer: Medicare Other | Admitting: Podiatry

## 2020-06-16 DIAGNOSIS — L97512 Non-pressure chronic ulcer of other part of right foot with fat layer exposed: Secondary | ICD-10-CM | POA: Diagnosis not present

## 2020-06-16 DIAGNOSIS — I739 Peripheral vascular disease, unspecified: Secondary | ICD-10-CM

## 2020-06-16 DIAGNOSIS — Z9889 Other specified postprocedural states: Secondary | ICD-10-CM | POA: Diagnosis not present

## 2020-06-16 DIAGNOSIS — E0859 Diabetes mellitus due to underlying condition with other circulatory complications: Secondary | ICD-10-CM

## 2020-06-16 NOTE — Progress Notes (Signed)
Subjective:  Patient presents today status post transmetatarsal amputation of the right foot. DOS: 11/14/2019.  Patient continues to experience some pain and tenderness to the lateral aspect of the right forefoot.  She has been dressing the foot with antibiotic ointment and wearing the surgical shoe as directed.  No new complaints at this time  Past Medical History:  Diagnosis Date  . Acute respiratory failure ([place])   . Anemia    previous blood transfusions  . Arthritis    "all over"  . Asthma   . Atrial flutter ([place])   . Bradycardia    requiring previous d/c of BB and reduction of amiodarone  . CAD (coronary artery disease)    nonobstructive per notes  . Chronic diastolic CHF (congestive heart failure) ([place])   . CKD (chronic kidney disease), stage III ([place])   . Complication of blood transfusion    "got the wrong blood type at [facility] in ~ 2015; no adverse reaction that we are aware of"/daughter, [name] (01/27/2016)  . COPD (chronic obstructive pulmonary disease) ([place])   . Depression    "light case"  . DVT (deep venous thrombosis) ([place]) 01/2016   a. LLE DVT 01/2016 - switched from Eliquis to Coumadin.  [name] Dyspnea    with some activity  . Gastric stenosis    a. s/p stomach tube  . GERD (gastroesophageal reflux disease)   . Headache   . History of 2019 novel coronavirus disease (COVID-19)   . History of blood transfusion    "several" (01/27/2016)  . History of stomach ulcers   . Hyperlipidemia   . Hypertension   . Hypothyroidism   . On home oxygen therapy    "2L; 9PM til 9AM" (06/27/2017)  . PAD (peripheral artery disease) ([place])    a. prior gangrene, toe amputations, intervention.  [name] PAF (paroxysmal atrial fibrillation) ([place])   . Paraesophageal hernia   . Perforated gastric ulcer ([place])   . Pneumonia    "a few times" (06/27/2017)  . Seasonal allergies   . SIADH (syndrome of inappropriate ADH production) ([place])    [name] 01/10/2015  . Small bowel obstruction ([place])    "I  don't know how many" (01/11/2015)  . Stroke [facility])    several- no residual  . Type II diabetes mellitus ([place])    "related to prednisone use  for > 20 yr; once predinose stopped; no more DM RX" (01/27/2016)  . UTI (urinary tract infection) 02/08/2016  . Ventral hernia with bowel obstruction     Objective: Physical Exam General: The patient is alert and oriented x3 in no acute distress.  Dermatology: Wound noted to the distal lateral aspect of the right forefoot stump measuring approximately 0.2 x 0.2 x 0.1 cm.  To the noted ulceration there is no eschar.  There is a moderate amount of slough fibrin and necrotic tissue noted.  Granulation tissue and wound base is red.  There is no exposed bone muscle tendon ligament or joint.  No malodor noted.  Periwound integrity is callused.   There is also some preulcerative callus tissue noted to the left foot bunion.  Occasionally this needs to be trimmed.  Today she is requesting to have it trimmed.  Vascular: Palpable pedal pulses bilaterally.  Negative for any significant erythema or edema to the leg.  Neurological: Epicritic and protective threshold diminished bilaterally.   Musculoskeletal Exam: Status post TMA right foot.  Hallux valgus left.  Second toe amputation left.  Assessment: 1. s/p transmetatarsal amputation  right foot. DOS: 11/14/2019 2.  Ulcer right lateral forefoot amputation stump 3.    Plan of Care:  1. Patient was evaluated.  2.  Medically necessary excisional debridement including subcutaneous tissue was performed using a tissue nipper with post debridement measurement same as pre-.  Excisional debridement of all the necrotic nonviable tissue down to the healthy bleeding viable tissue was performed 3.  Continue Betadine daily and Silvadene cream intermittently 4.  Continue wearing diabetic shoes and insoles.  Offloading metatarsal pads were adjusted today to offload pressure from the forefoot 5.  Excisional debridement of the  preulcerative callus tissue overlying the bunion of the left foot was performed using a chisel blade without incident or bleeding.   6.  Return to clinic 2 months   Edrick Kins, DPM [facility]  Dr. Edrick Kins, DPM    [address], [place] [address]                Office [phone]  Fax [phone]

## 2020-06-22 ENCOUNTER — Encounter: Payer: Self-pay | Admitting: Cardiology

## 2020-06-22 ENCOUNTER — Other Ambulatory Visit: Payer: Self-pay

## 2020-06-22 ENCOUNTER — Ambulatory Visit (INDEPENDENT_AMBULATORY_CARE_PROVIDER_SITE_OTHER): Payer: Medicare Other | Admitting: Cardiology

## 2020-06-22 ENCOUNTER — Ambulatory Visit (INDEPENDENT_AMBULATORY_CARE_PROVIDER_SITE_OTHER): Payer: Medicare Other | Admitting: *Deleted

## 2020-06-22 VITALS — BP 176/80 | HR 69 | Ht 66.0 in | Wt 132.0 lb

## 2020-06-22 DIAGNOSIS — I4891 Unspecified atrial fibrillation: Secondary | ICD-10-CM | POA: Diagnosis not present

## 2020-06-22 DIAGNOSIS — Z5181 Encounter for therapeutic drug level monitoring: Secondary | ICD-10-CM | POA: Diagnosis not present

## 2020-06-22 DIAGNOSIS — Z8719 Personal history of other diseases of the digestive system: Secondary | ICD-10-CM

## 2020-06-22 DIAGNOSIS — I480 Paroxysmal atrial fibrillation: Secondary | ICD-10-CM

## 2020-06-22 DIAGNOSIS — I4892 Unspecified atrial flutter: Secondary | ICD-10-CM

## 2020-06-22 DIAGNOSIS — I5032 Chronic diastolic (congestive) heart failure: Secondary | ICD-10-CM

## 2020-06-22 DIAGNOSIS — I10 Essential (primary) hypertension: Secondary | ICD-10-CM | POA: Diagnosis not present

## 2020-06-22 LAB — POCT INR: INR: 4 — AB (ref 2.0–3.0)

## 2020-06-22 NOTE — Progress Notes (Signed)
Cardiology Office Note    Date:  06/22/2020   ID:  Shaiel, Pelz [birth year], MRN [id number]  PCP:  Sharon Seller, NP  Cardiologist: Tobias Alexander, MD EPS: None  Reason for visit: 6 months follow-up  History of Present Illness:  Shannon [name] is a 87 y.o. female with history of chronic diastolic CHF, paroxysmal atrial fibrillation on Coumadin, gastric outlet obstruction s/p gastrojejunostomy feeding tube nonobstructive CAD, history of CVA, COPD, CKD stage III, insulin-dependent type 2 diabetes, hypertension, hyperlipidemia, anemia of chronic disease, lower extremity PAD s/p bilateral interventions followed by VVS,hypothyroidism, and depression   Have the leg and thyroid not started for $5 Patient admitted with syncope 11/24/2018 possible secondary to bradycardia-coreg stopped. Troponins normal, CT negative, echo normal LVEF. Norvasc added for BP control. Not seen by Cardiology. Readmitted 12/26/18 with [facility] in the setting of constipation felt to be a hemorrhoidal bleed. Hbg stable 10.1 coumadin continued. Carotid dopplers were stable, 30 day monitor without pauses or arrhthymias, She was hospitalized on May 26 with chest pain that was determined to be musculoskeletal.    Patient is coming after 6 months, she denies any new symptoms, she has no chest pain or shortness of breath, she continues to walk with a walker and has not had any falls.  No palpitations only occasional mild blood streaks seen when she blows her nose no other bleeding.  Minimal lower extremity edema.  She has fair appetite and also has a feeding tube.  She has been compliant with all of her medications.  She has not taken her blood pressure meds today yet.  Past Medical History:  Diagnosis Date  . Acute respiratory failure (HCC)   . Anemia    previous blood transfusions  . Arthritis    "all over"  . Asthma   . Atrial flutter (HCC)   . Bradycardia    requiring previous d/c of BB and reduction of amiodarone   . CAD (coronary artery disease)    nonobstructive per notes  . Chronic diastolic CHF (congestive heart failure) (HCC)   . CKD (chronic kidney disease), stage III (HCC)   . Complication of blood transfusion    "got the wrong blood type at [facility] in ~ 2015; no adverse reaction that we are aware of"/daughter, [name] (01/27/2016)  . COPD (chronic obstructive pulmonary disease) (HCC)   . Depression    "light case"  . DVT (deep venous thrombosis) (HCC) 01/2016   a. LLE DVT 01/2016 - switched from Eliquis to Coumadin.  [name] Dyspnea    with some activity  . Gastric stenosis    a. s/p stomach tube  . GERD (gastroesophageal reflux disease)   . Headache   . History of 2019 novel coronavirus disease (COVID-19)   . History of blood transfusion    "several" (01/27/2016)  . History of stomach ulcers   . Hyperlipidemia   . Hypertension   . Hypothyroidism   . On home oxygen therapy    "2L; 9PM til 9AM" (06/27/2017)  . PAD (peripheral artery disease) (HCC)    a. prior gangrene, toe amputations, intervention.  [name] PAF (paroxysmal atrial fibrillation) (HCC)   . Paraesophageal hernia   . Perforated gastric ulcer (HCC)   . Pneumonia    "a few times" (06/27/2017)  . Seasonal allergies   . SIADH (syndrome of inappropriate ADH production) (HCC)    [name] 01/10/2015  . Small bowel obstruction (HCC)    "I don't know how many" (01/11/2015)  .  Stroke [facility])    several- no residual  . Type II diabetes mellitus (HCC)    "related to prednisone use  for > 20 yr; once predinose stopped; no more DM RX" (01/27/2016)  . UTI (urinary tract infection) 02/08/2016  . Ventral hernia with bowel obstruction     Past Surgical History:  Procedure Laterality Date  . ABDOMINAL AORTOGRAM N/A 09/21/2016   Procedure: Abdominal Aortogram;  Surgeon: Maeola Harman, MD;  Location: [facility] INVASIVE CV LAB;  Service: Cardiovascular;  Laterality: N/A;  . AMPUTATION Left 09/25/2016   Procedure: AMPUTATION DIGIT- LEFT 2ND  AND 3RD TOES;  Surgeon: Larina Earthly, MD;  Location: [facility] OR;  Service: Vascular;  Laterality: Left;  . AMPUTATION Right 05/03/2018   Procedure: AMPUTATION RIGHT GREAT TOE;  Surgeon: Larina Earthly, MD;  Location: [facility] OR;  Service: Vascular;  Laterality: Right;  . CATARACT EXTRACTION W/ INTRAOCULAR LENS  IMPLANT, BILATERAL    . CHOLECYSTECTOMY OPEN    . COLECTOMY    . ESOPHAGOGASTRODUODENOSCOPY N/A 01/19/2014   Procedure: ESOPHAGOGASTRODUODENOSCOPY (EGD);  Surgeon: Hilarie Fredrickson, MD;  Location: [facility] ENDOSCOPY;  Service: Endoscopy;  Laterality: N/A;  . ESOPHAGOGASTRODUODENOSCOPY N/A 01/20/2014   Procedure: ESOPHAGOGASTRODUODENOSCOPY (EGD);  Surgeon: Hilarie Fredrickson, MD;  Location: [facility];  Service: Endoscopy;  Laterality: N/A;  . ESOPHAGOGASTRODUODENOSCOPY N/A 03/19/2014   Procedure: ESOPHAGOGASTRODUODENOSCOPY (EGD);  Surgeon: Rachael Fee, MD;  Location: [facility];  Service: Endoscopy;  Laterality: N/A;  . ESOPHAGOGASTRODUODENOSCOPY N/A 07/08/2015   Procedure: ESOPHAGOGASTRODUODENOSCOPY (EGD);  Surgeon: Sherrilyn Rist, MD;  Location: [facility] ENDOSCOPY;  Service: Endoscopy;  Laterality: N/A;  . ESOPHAGOGASTRODUODENOSCOPY (EGD) WITH PROPOFOL N/A 09/15/2015   Procedure: ESOPHAGOGASTRODUODENOSCOPY (EGD) WITH PROPOFOL;  Surgeon: Ruffin Frederick, MD;  Location: [facility] ENDOSCOPY;  Service: Gastroenterology;  Laterality: N/A;  . GASTROJEJUNOSTOMY     hx/notes 01/10/2015  . GASTROJEJUNOSTOMY N/A 09/23/2015   Procedure: OPEN GASTROJEJUNOSTOMY TUBE PLACEMENT;  Surgeon: De Blanch Kinsinger, MD;  Location: [facility] ORS;  Service: General;  Laterality: N/A;  . GLAUCOMA SURGERY Bilateral   . HERNIA REPAIR  2015  . IR CM INJ ANY COLONIC TUBE W/FLUORO  01/05/2017  . IR CM INJ ANY COLONIC TUBE W/FLUORO  06/07/2017  . IR CM INJ ANY COLONIC TUBE W/FLUORO  11/05/2017  . IR CM INJ ANY COLONIC TUBE W/FLUORO  09/18/2018  . IR CM INJ ANY COLONIC TUBE W/FLUORO  03/06/2019  . IR CM INJ ANY COLONIC TUBE W/FLUORO  03/11/2019  . IR  GASTR TUBE CONVERT GASTR-JEJ PER W/FL MOD SED  04/30/2020  . IR GENERIC HISTORICAL  01/07/2016   IR GJ TUBE CHANGE 01/07/2016 Malachy Moan, MD [facility]  . IR GENERIC HISTORICAL  01/27/2016   IR MECH REMOV OBSTRUC MAT ANY COLON TUBE W/FLUORO 01/27/2016 Richarda Overlie, MD [facility]  . IR GENERIC HISTORICAL  02/07/2016   IR PATIENT EVAL TECH 0-60 MINS Darrell K Allred, PA-C [facility]  . IR GENERIC HISTORICAL  02/08/2016   IR GJ TUBE CHANGE 02/08/2016 Berdine Dance, MD [facility]  . IR GENERIC HISTORICAL  01/06/2016   IR GJ TUBE CHANGE 01/06/2016 [facility] OUT REF  . IR GENERIC HISTORICAL  05/02/2016   IR CM INJ ANY COLONIC TUBE W/FLUORO 05/02/2016 Oley Balm, MD [facility]  . IR GENERIC HISTORICAL  05/15/2016   IR GJ TUBE CHANGE 05/15/2016 Simonne Come, MD [facility]  . IR GENERIC HISTORICAL  06/28/2016   IR GJ TUBE CHANGE 06/28/2016 [facility]  . IR GJ TUBE CHANGE  02/20/2017  . IR GJ TUBE CHANGE  05/10/2017  . IR GJ TUBE CHANGE  07/06/2017  . IR GJ TUBE CHANGE  08/02/2017  . IR GJ TUBE CHANGE  10/15/2017  . IR GJ TUBE CHANGE  12/26/2017  . IR GJ TUBE CHANGE  04/08/2018  . IR GJ TUBE CHANGE  06/19/2018  . IR GJ TUBE CHANGE  03/03/2019  . IR GJ TUBE CHANGE  06/06/2019  . IR GJ TUBE CHANGE  09/26/2019  . IR GJ TUBE CHANGE  01/09/2020  . IR GJ TUBE CHANGE  05/12/2020  . IR PATIENT EVAL TECH 0-60 MINS  10/19/2016  . IR PATIENT EVAL TECH 0-60 MINS  12/25/2016  . IR PATIENT EVAL TECH 0-60 MINS  01/29/2017  . IR PATIENT EVAL TECH 0-60 MINS  04/04/2017  . IR PATIENT EVAL TECH 0-60 MINS  04/30/2017  . IR PATIENT EVAL TECH 0-60 MINS  07/31/2017  . IR REPLC DUODEN/JEJUNO TUBE PERCUT W/FLUORO  11/14/2016  . LAPAROTOMY N/A 01/20/2015   Procedure: EXPLORATORY LAPAROTOMY;  Surgeon: Abigail Miyamoto, MD;  Location: [facility] OR;  Service: General;  Laterality: N/A;  . LOWER EXTREMITY ANGIOGRAPHY Left 09/21/2016   Procedure: Lower Extremity Angiography;  Surgeon: Maeola Harman, MD;  Location[facility] INVASIVE  CV LAB;  Service: Cardiovascular;  Laterality: Left;  . LOWER EXTREMITY ANGIOGRAPHY Right 06/27/2017   Procedure: Lower Extremity Angiography;  Surgeon: Maeola Harman, MD;  Location: [facility] INVASIVE CV LAB;  Service: Cardiovascular;  Laterality: Right;  . LYSIS OF ADHESION N/A 01/20/2015   Procedure: LYSIS OF ADHESIONS < 1 HOUR;  Surgeon: Abigail Miyamoto, MD;  [facility];  Service: General;  Laterality: N/A;  . PERIPHERAL VASCULAR BALLOON ANGIOPLASTY Left 09/21/2016   Procedure: Peripheral Vascular Balloon Angioplasty;  Surgeon: Maeola Harman, MD;  Location: [facility] INVASIVE CV LAB;  Service: Cardiovascular;  Laterality: Left;  drug coated balloon  . PERIPHERAL VASCULAR BALLOON ANGIOPLASTY Right 06/27/2017   Procedure: PERIPHERAL VASCULAR BALLOON ANGIOPLASTY;  Surgeon: Maeola Harman, MD;  Location: [facility] INVASIVE CV LAB;  Service: Cardiovascular;  Laterality: Right;  SFA/[id number]  . TONSILLECTOMY    . TRANSMETATARSAL AMPUTATION Right 11/14/2019   Procedure: TRANSMETATARSAL AMPUTATION RIGHT FOOT;  Surgeon: Felecia Shelling, DPM;  Location: [facility] OR;  Service: Podiatry;  Laterality: Right;  . TUBAL LIGATION    . VENTRAL HERNIA REPAIR  2015   incarcerated ventral hernia ([facility] 09/2013)/notes 01/10/2015    Current Medications: Current Meds  Medication Sig  . acetaminophen (TYLENOL) 500 MG tablet Take 500 mg by mouth every 6 (six) hours as needed.  [name] amLODipine (NORVASC) 5 MG tablet Take 1 tablet (5 mg total) by mouth daily.  [name] atorvastatin (LIPITOR) 10 MG tablet TAKE 1 TABLET BY MOUTH EVERY DAY  . b complex vitamins tablet Take 1 tablet by mouth daily.   . benzocaine (AMERICAINE) 20 % rectal ointment Place rectally every 3 (three) hours as needed for pain.  . calcium-vitamin D (OSCAL WITH D) 500-200 MG-UNIT per tablet Take 1 tablet by mouth daily with breakfast.   . Carboxymethylcellulose Sodium (ARTIFICIAL TEARS OP) Place 1 drop into both eyes 4 (four) times daily.  .  cycloSPORINE (RESTASIS) 0.05 % ophthalmic emulsion USE 1 DROP INTO BOTH EYES TWICE DAILY  . DULERA 200-5 MCG/ACT AERO TAKE TWO PUFFS BY MOUTH TWICE DAILY  . DULoxetine (CYMBALTA) 30 MG capsule TAKE 1 CAPSULE BY MOUTH EVERY DAY  . ferrous sulfate 220 (44 Fe) MG/5ML solution TAKE 7.4 MLS (325 MG TOTAL) BY MOUTH  DAILY.  . fluticasone (FLONASE) 50 MCG/ACT nasal spray SPRAY 2 SPRAYS INTO EACH NOSTRIL EVERY DAY  . furosemide (LASIX) 40 MG tablet Take 0.5-1 tablets (20-40 mg total) by mouth See admin instructions. Take 20 mg daily, may instead take 40 mg daily as needed for edema  . gabapentin (NEURONTIN) 300 MG capsule Take 1 capsule (300 mg total) by mouth 2 (two) times daily.  [name] gentamicin cream (GARAMYCIN) 0.1 % Apply 1 application topically daily as needed (dry skin).  . hydrALAZINE (APRESOLINE) 50 MG tablet TAKE 1 TABLET BY MOUTH THREE TIMES A DAY  . insulin aspart (NOVOLOG) 100 UNIT/ML FlexPen Inject into the skin as needed for high blood sugar. New sliding Scale If BS is 151 - 251 = 2 units,  If BS is 251 -  350 = 4 units,  If BS is 351 - 450 = 6 units Call provider if BS runs more than 400  . Insulin Pen Needle (B-D UF III MINI PEN NEEDLES) 31G X 5 MM MISC Use twice daily with giving insulin injections. Dx E11.9  . ipratropium-albuterol (DUONEB) 0.5-2.5 (3) MG/3ML SOLN Take 3 mLs by nebulization every 6 (six) hours as needed (shortness of breath).  . isosorbide mononitrate (IMDUR) 30 MG 24 hr tablet TAKE 2 TABLETS BY MOUTH DAILY. PLEASE MAKE ANNUAL APPT WITH DR. Delton See FOR FUTURE REFILLS  . LANTUS SOLOSTAR 100 UNIT/ML Solostar Pen INJECT 8 UNITS INTO THE SKIN AT BEDTIME.  [name] latanoprost (XALATAN) 0.005 % ophthalmic solution 1 drop at bedtime.  . levalbuterol (XOPENEX HFA) 45 MCG/ACT inhaler Inhale 2 puffs into the lungs every 6 (six) hours as needed for wheezing.  [name] levothyroxine (SYNTHROID) 125 MCG tablet TAKE 1 TABLET BY MOUTH EVERY DAY  . loratadine (CLARITIN) 10 MG tablet Take 10 mg by mouth  daily as needed for allergies.  . Multiple Vitamin (MULTIVITAMIN WITH MINERALS) TABS tablet Take 1 tablet by mouth daily.  . Nutritional Supplements (FEEDING SUPPLEMENT, OSMOLITE 1.5 CAL,) LIQD Place 711 mLs into feeding tube See admin instructions. Take 711 ml over a 12 hour period per day  . nystatin (MYCOSTATIN/NYSTOP) powder Apply 1 application topically as needed (skin irritation).  . nystatin cream (MYCOSTATIN) APPLY TO AFFECTED AREA TWICE A DAY  . oxyCODONE (ROXICODONE) 5 MG immediate release tablet Take 1 tablet (5 mg total) by mouth every 4 (four) hours as needed for severe pain. May take 1/2 tab q4hrs as needed for mild pain.  . pantoprazole (PROTONIX) 40 MG tablet TAKE 1 TABLET BY MOUTH EVERY DAY  . polyethylene glycol (MIRALAX / GLYCOLAX) 17 g packet Take 17 g by mouth daily as needed for mild constipation.  . Probiotic Product (EQL PROBIOTIC COLON SUPPORT PO) Take 1 capsule by mouth daily.  [name] senna-docusate (SENOKOT-S) 8.6-50 MG tablet Take 1 tablet by mouth as needed for mild constipation.  [name] warfarin (COUMADIN) 2.5 MG tablet TAKE 2-3 TABLETS BY MOUTH DAILY AS DIRECTED BY COUMADIN CLINIC.  [name] Water For Irrigation, Sterile (FREE WATER) SOLN Place 180 mLs into feeding tube 3 (three) times daily.   Cliffton Asters Petrolatum-Mineral Oil (SYSTANE NIGHTTIME) OINT Place 1 application into both eyes at bedtime.      Allergies:   Penicillins   Social History   Socioeconomic History  . Marital status: Widowed    Spouse name: Not on file  . Number of children: 3  . Years of education: Not on file  . Highest education level: Not on file  Occupational History  . Not on file  Tobacco Use  . Smoking status: Never Smoker  . Smokeless tobacco: Former Neurosurgeon    Types: Snuff  . Tobacco comment: "used snuff in my younger days"  Vaping Use  . Vaping Use: Never used  Substance and Sexual Activity  . Alcohol use: Never    Alcohol/week: 0.0 standard drinks  . Drug use: Never  . Sexual activity: Not  Currently  Other Topics Concern  . Not on file  Social History Narrative   Married in [place], lives in one story house with 2 people and no pets   Occupation: ?   Has a living Will, [place], and doesn't have DNR   Was living with Husband (in frail health) in [place] until 09/2013.  After her [facility] admission they both came to live with daughter in [place]   Social Determinants of Health   Financial Resource Strain: Not on file  Food Insecurity: No Food Insecurity  . Worried About Programme researcher, broadcasting/film/video in the Last Year: Never true  . Ran Out of Food in the Last Year: Never true  Transportation Needs: No Transportation Needs  . Lack of Transportation (Medical): No  . Lack of Transportation (Non-Medical): No  Physical Activity: Not on file  Stress: Not on file  Social Connections: Not on file    Family History:  The patient's family history includes Diabetes in her brother; Glaucoma in her son and son; Hypertension in her mother; Stroke in her mother.   ROS:   Please see the history of present illness.    ROS All other systems reviewed and are negative.  PHYSICAL EXAM:    VS:  BP (!) 176/80   Pulse 69   Ht 5\' 6"  (1.676 m)   Wt 132 lb (59.9 kg)   SpO2 97%   BMI 21.31 kg/m   Physical Exam  GEN: Thin, elderly, in no acute distress  Neck: no JVD, carotid bruits, or masses Cardiac:RRR; 2/6 systolic murmur LSB Respiratory:  clear to auscultation bilaterally, normal work of breathing GI: soft, nontender, nondistended, + BS Ext: right foot in boot, minimal swelling  Neuro:  Alert and Oriented x 3 Psych: euthymic mood, full affect  Wt Readings from Last 3 Encounters:  06/22/20 132 lb (59.9 kg)  06/09/20 132 lb 9.6 oz (60.1 kg)  05/17/20 130 lb (59 kg)     Studies/Labs Reviewed:   EKG:  EKG is ordered today.  It shows sinus rhythm with nonspecific ST-T wave abnormalities unchanged from prior, unchanged from prior.  Recent Labs: 07/07/2019: B Natriuretic Peptide  242.4 08/28/2019: TSH 1.428 11/15/2019: Magnesium 2.1 05/07/2020: ALT 20; Hemoglobin 10.5; Platelets 225 06/02/2020: BUN 52; Creat 1.43; Potassium 4.5; Sodium 134   Lipid Panel    Component Value Date/Time   CHOL 131 10/15/2018 1122   CHOL 126 04/07/2015 1015   TRIG 188 (H) 10/15/2018 1122   HDL 47 (L) 10/15/2018 1122   HDL 69 04/07/2015 1015   CHOLHDL 2.8 10/15/2018 1122   VLDL 26 12/24/2013 0456   LDLCALC 58 10/15/2018 1122   Additional studies/ records that were reviewed today include:  2D echo 05/19/2019   1. Left ventricular ejection fraction, by estimation, is 65 to 70%. The  left ventricle has normal function. The left ventricle has no regional  wall motion abnormalities. There is mild concentric left ventricular  hypertrophy. Left ventricular diastolic  parameters are consistent with Grade I diastolic dysfunction (impaired  relaxation).   2. Right ventricular systolic function is normal. The right ventricular  size is normal. There is normal pulmonary artery systolic pressure. The  estimated right ventricular systolic pressure is 31.4 mmHg.   3. The mitral valve is degenerative. Trivial mitral valve regurgitation.   4. The aortic valve has an indeterminant number of cusps. Aortic valve  regurgitation is trivial. Mild aortic valve sclerosis is present, with no  evidence of aortic valve stenosis.   5. The inferior vena cava is normal in size with <50% respiratory  variability, suggesting right atrial pressure of 8 mmHg.   Comparison(s): A prior study was performed on 11/25/2018. No significant  change from prior study.   Carotid Dopplers 2/2021Summary:  Right Carotid: Velocities in the right ICA are consistent with a 1-39%  stenosis.   Left Carotid: Velocities in the left ICA are consistent with a 1-39%  stenosis.   Vertebrals: Bilateral vertebral arteries demonstrate antegrade flow.    ASSESSMENT:    1. PAF (paroxysmal atrial fibrillation) (HCC)   2. Essential  hypertension   3. History of GI bleed   4. Chronic diastolic CHF (congestive heart failure) (HCC)    PLAN:   In order of problems listed above:  PAF with syncope 11/2018 felt secondary to bradycardia and Coreg stopped.  30-day monitor without significant arrhythmia. NSR on EKG in hospital, in sinus rhythm today.  She has no bleeding, most recent INR 2.9, no bleeding, most recent hemoglobin 10.5.  Essential hypertension -elevated, she has not taken her meds yet, her daughter will send [place] a few blood pressure measurements from home and we will adjust medications as needed.  Chronic diastolic CHF normal LVEF with grade 1 diastolic dysfunction on echo 05/2019, euvolemic today.  She drinks some fluids but also uses feeding tube for fluid intake.  GI bleed felt secondary to hemorrhoids 12/2018 continues on Coumadin, most recent INR 2.9, hemoglobin 10.5.  Medication Adjustments/Labs and Tests Ordered: Current medicines are reviewed at length with the patient today.  Concerns regarding medicines are outlined above.  Medication changes, Labs and Tests ordered today are listed in the Patient Instructions below. Patient Instructions  Medication Instructions:   Your physician recommends that you continue on your current medications as directed. Please refer to the Current Medication list given to you today.  *If you need a refill on your cardiac medications before your next appointment, please call your pharmacy*    Follow-Up: At [facility], you and your health needs are our priority.  As part of our continuing mission to provide you with exceptional heart care, we have created designated Provider Care Teams.  These Care Teams include your primary Cardiologist (physician) and Advanced Practice Providers (APPs -  Physician Assistants and Nurse Practitioners) who all work together to provide you with the care you need, when you need it.  We recommend signing up for the patient portal called  "MyChart".  Sign up information is provided on this After Visit Summary.  MyChart is used to connect with patients for Virtual Visits (Telemedicine).  Patients are able to view lab/test results, encounter notes, upcoming appointments, etc.  Non-urgent messages can be sent to your provider as well.   To learn more about what you can do with MyChart, go to [email or web address].    Your next appointment:   8 month(s)  The format for your next appointment:   In Person  Provider:   Laurance Flatten, MD   Please send [place] some BP readings through mychart for Dr. Delton See to review next week before she leaves  Signed, Tobias Alexander, MD  06/22/2020 12:06 PM    [facility] [address] [place], [place], [place]  [address] Phone: [phone]; Fax: [phone]

## 2020-06-22 NOTE — Patient Instructions (Signed)
Medication Instructions:   Your physician recommends that you continue on your current medications as directed. Please refer to the Current Medication list given to you today.  *If you need a refill on your cardiac medications before your next appointment, please call your pharmacy*    Follow-Up: At [facility], you and your health needs are our priority.  As part of our continuing mission to provide you with exceptional heart care, we have created designated Provider Care Teams.  These Care Teams include your primary Cardiologist (physician) and Advanced Practice Providers (APPs -  Physician Assistants and Nurse Practitioners) who all work together to provide you with the care you need, when you need it.  We recommend signing up for the patient portal called "MyChart".  Sign up information is provided on this After Visit Summary.  MyChart is used to connect with patients for Virtual Visits (Telemedicine).  Patients are able to view lab/test results, encounter notes, upcoming appointments, etc.  Non-urgent messages can be sent to your provider as well.   To learn more about what you can do with MyChart, go to [email or web address].    Your next appointment:   8 month(s)  The format for your next appointment:   In Person  Provider:   Gwyndolyn Kaufman, MD   Please send [place] some BP readings through mychart for Dr. Meda Coffee to review next week before she leaves

## 2020-06-22 NOTE — Patient Instructions (Signed)
Description   Hold warfarin today, then continue to take 2 tablets daily except for 3 tablets on Sunday, Tuesday and Thursday, Recheck INR in 2 weeks. Recheck INR in 2 weeks. Coumadin Clinic [phone].

## 2020-06-23 ENCOUNTER — Other Ambulatory Visit: Payer: Self-pay | Admitting: Nurse Practitioner

## 2020-06-23 DIAGNOSIS — F32A Depression, unspecified: Secondary | ICD-10-CM

## 2020-07-05 ENCOUNTER — Other Ambulatory Visit: Payer: Self-pay

## 2020-07-05 ENCOUNTER — Ambulatory Visit (INDEPENDENT_AMBULATORY_CARE_PROVIDER_SITE_OTHER): Payer: Medicare Other | Admitting: Family

## 2020-07-05 ENCOUNTER — Encounter: Payer: Self-pay | Admitting: Family

## 2020-07-05 VITALS — BP 140/62 | HR 88 | Temp 97.7°F | Resp 16 | Ht 66.0 in | Wt 129.8 lb

## 2020-07-05 DIAGNOSIS — Z931 Gastrostomy status: Secondary | ICD-10-CM | POA: Diagnosis not present

## 2020-07-05 DIAGNOSIS — R1012 Left upper quadrant pain: Secondary | ICD-10-CM

## 2020-07-05 NOTE — Progress Notes (Signed)
Provider: Shankar Silber FNP-C  [name], NP  Patient Care Team: [name], NP as PCP - General (Geriatric Medicine) Dorothy Spark, MD as PCP - Cardiology (Cardiology) Edrick Kins, DPM as Consulting Physician (Podiatry) Dorothy Spark, MD as Consulting Physician (Cardiology) [name] [name], NP as Nurse Practitioner (Geriatric Medicine) Camillo Flaming, [place] as Referring Physician (Optometry) [name], RN as [facility] Management  Extended Emergency Contact Information Primary Emergency Contact: Soledad Gerlach Address: [address]          [place], [place] [address] Johnnette Litter of [place] Phone: [phone] Mobile Phone: [phone] Relation: Daughter Secondary Emergency Contact: Knoch,John  [place] Phone: [phone] Relation: Son  Code Status: Full Code  Goals of care: Advanced Directive information Advanced Directives 07/05/2020  Does Patient Have a Medical Advance Directive? Yes  Type of Advance Directive [place]  Does patient want to make changes to medical advance directive? No - Patient declined  Copy of [place] in Chart? Yes - validated most recent copy scanned in chart (See row information)  Would patient like information on creating a medical advance directive? -     Chief Complaint  Patient presents with  . Acute Visit    Possible infection at feeding tube site.    HPI:  Pt is a 87 y.o. female seen today for an acute visit for evaluation of possible infected area on PEG tube site.Daughter states was clean PEG tube site when she noticed area that was open.Care giver also noted yellow drainage on previous day with strong odor.states after cleaning area seems to have stopped draining.patient states surrounding area seem to be hard and tender to touch.she reports constipation some times has small bowel movement.LBM yesterday was  large.denies any nausea or vomiting.continues to eat soft food orally and tube feeding via PEG tube .    Past Medical History:  Diagnosis Date  . Acute respiratory failure ([place])   . Anemia    previous blood transfusions  . Arthritis    "all over"  . Asthma   . Atrial flutter ([place])   . Bradycardia    requiring previous d/c of BB and reduction of amiodarone  . CAD (coronary artery disease)    nonobstructive per notes  . Chronic diastolic CHF (congestive heart failure) ([place])   . CKD (chronic kidney disease), stage III ([place])   . Complication of blood transfusion    "got the wrong blood type at [facility] in ~ 2015; no adverse reaction that we are aware of"/daughter, [name] (01/27/2016)  . COPD (chronic obstructive pulmonary disease) ([place])   . Depression    "light case"  . DVT (deep venous thrombosis) ([place]) 01/2016   a. LLE DVT 01/2016 - switched from Eliquis to Coumadin.  [name] Dyspnea    with some activity  . Gastric stenosis    a. s/p stomach tube  . GERD (gastroesophageal reflux disease)   . Headache   . History of 2019 novel coronavirus disease (COVID-19)   . History of blood transfusion    "several" (01/27/2016)  . History of stomach ulcers   . Hyperlipidemia   . Hypertension   . Hypothyroidism   . On home oxygen therapy    "2L; 9PM til 9AM" (06/27/2017)  . PAD (peripheral artery disease) ([place])    a. prior gangrene, toe amputations, intervention.  [name] PAF (paroxysmal atrial fibrillation) ([place])   . Paraesophageal hernia   .  Perforated gastric ulcer ([place])   . Pneumonia    "a few times" (06/27/2017)  . Seasonal allergies   . SIADH (syndrome of inappropriate ADH production) ([place])    [name] 01/10/2015  . Small bowel obstruction ([place])    "I don't know how many" (01/11/2015)  . Stroke [facility])    several- no residual  . Type II diabetes mellitus ([place])    "related to prednisone use  for > 20 yr; once predinose stopped; no more DM RX" (01/27/2016)  . UTI (urinary tract infection)  02/08/2016  . Ventral hernia with bowel obstruction    Past Surgical History:  Procedure Laterality Date  . ABDOMINAL AORTOGRAM N/A 09/21/2016   Procedure: Abdominal Aortogram;  Surgeon: Waynetta Sandy, MD;  Location: [facility];  Service: Cardiovascular;  Laterality: N/A;  . AMPUTATION Left 09/25/2016   Procedure: AMPUTATION DIGIT- LEFT 2ND AND 3RD TOES;  Surgeon: Rosetta Posner, MD;  Location: [facility];  Service: Vascular;  Laterality: Left;  . AMPUTATION Right 05/03/2018   Procedure: AMPUTATION RIGHT GREAT TOE;  Surgeon: Rosetta Posner, MD;  Location: [facility];  Service: Vascular;  Laterality: Right;  . CATARACT EXTRACTION W/ INTRAOCULAR LENS  IMPLANT, BILATERAL    . CHOLECYSTECTOMY OPEN    . COLECTOMY    . ESOPHAGOGASTRODUODENOSCOPY N/A 01/19/2014   Procedure: ESOPHAGOGASTRODUODENOSCOPY (EGD);  Surgeon: Irene Shipper, MD;  Location[facility];  Service: Endoscopy;  Laterality: N/A;  . ESOPHAGOGASTRODUODENOSCOPY N/A 01/20/2014   Procedure: ESOPHAGOGASTRODUODENOSCOPY (EGD);  Surgeon: Irene Shipper, MD;  Location: [facility];  Service: Endoscopy;  Laterality: N/A;  . ESOPHAGOGASTRODUODENOSCOPY N/A 03/19/2014   Procedure: ESOPHAGOGASTRODUODENOSCOPY (EGD);  Surgeon: Milus Banister, MD;  Location: [facility];  Service: Endoscopy;  Laterality: N/A;  . ESOPHAGOGASTRODUODENOSCOPY N/A 07/08/2015   Procedure: ESOPHAGOGASTRODUODENOSCOPY (EGD);  Surgeon: Doran Stabler, MD;  Location: [facility];  Service: Endoscopy;  Laterality: N/A;  . ESOPHAGOGASTRODUODENOSCOPY (EGD) WITH PROPOFOL N/A 09/15/2015   Procedure: ESOPHAGOGASTRODUODENOSCOPY (EGD) WITH PROPOFOL;  Surgeon: Manus Gunning, MD;  [facility];  Service: Gastroenterology;  Laterality: N/A;  . GASTROJEJUNOSTOMY     hx/notes 01/10/2015  . GASTROJEJUNOSTOMY N/A 09/23/2015   Procedure: OPEN GASTROJEJUNOSTOMY TUBE PLACEMENT;  Surgeon: Arta Bruce Kinsinger, MD;  [facility];  Service: General;  Laterality: N/A;   . GLAUCOMA SURGERY Bilateral   . HERNIA REPAIR  2015  . IR CM INJ ANY COLONIC TUBE W/FLUORO  01/05/2017  . IR CM INJ ANY COLONIC TUBE W/FLUORO  06/07/2017  . IR CM INJ ANY COLONIC TUBE W/FLUORO  11/05/2017  . IR CM INJ ANY COLONIC TUBE W/FLUORO  09/18/2018  . IR CM INJ ANY COLONIC TUBE W/FLUORO  03/06/2019  . IR CM INJ ANY COLONIC TUBE W/FLUORO  03/11/2019  . IR GASTR TUBE CONVERT GASTR-JEJ PER W/FL MOD SED  04/30/2020  . IR GENERIC HISTORICAL  01/07/2016   IR GJ TUBE CHANGE 01/07/2016 Jacqulynn Cadet, MD [facility]  . IR GENERIC HISTORICAL  01/27/2016   IR MECH REMOV OBSTRUC MAT ANY COLON TUBE W/FLUORO 01/27/2016 Markus Daft, MD [facility]  . IR GENERIC HISTORICAL  02/07/2016   IR PATIENT EVAL TECH 0-60 MINS Darrell K Allred, PA-C [facility]  . IR GENERIC HISTORICAL  02/08/2016   IR GJ TUBE CHANGE 02/08/2016 Greggory Keen, MD [facility]  . IR GENERIC HISTORICAL  01/06/2016   IR GJ TUBE CHANGE 01/06/2016 [facility] OUT REF  . IR GENERIC HISTORICAL  05/02/2016   IR CM INJ ANY  COLONIC TUBE W/FLUORO 05/02/2016 Arne Cleveland, MD [facility]  . IR GENERIC HISTORICAL  05/15/2016   IR GJ TUBE CHANGE 05/15/2016 Sandi Mariscal, MD [facility]  . IR GENERIC HISTORICAL  06/28/2016   IR GJ TUBE CHANGE 06/28/2016 [facility]  . IR GJ TUBE CHANGE  02/20/2017  . IR GJ TUBE CHANGE  05/10/2017  . IR GJ TUBE CHANGE  07/06/2017  . IR GJ TUBE CHANGE  08/02/2017  . IR GJ TUBE CHANGE  10/15/2017  . IR GJ TUBE CHANGE  12/26/2017  . IR [place] TUBE CHANGE  04/08/2018  . IR [place] TUBE CHANGE  06/19/2018  . IR [place] TUBE CHANGE  03/03/2019  . IR [place] TUBE CHANGE  06/06/2019  . IR GJ TUBE CHANGE  09/26/2019  . IR GJ TUBE CHANGE  01/09/2020  . IR GJ TUBE CHANGE  05/12/2020  . IR PATIENT EVAL TECH 0-60 MINS  10/19/2016  . IR PATIENT EVAL TECH 0-60 MINS  12/25/2016  . IR PATIENT EVAL TECH 0-60 MINS  01/29/2017  . IR PATIENT EVAL TECH 0-60 MINS  04/04/2017  . IR PATIENT EVAL TECH 0-60 MINS  04/30/2017  . IR PATIENT EVAL TECH 0-60 MINS  07/31/2017   . IR REPLC DUODEN/JEJUNO TUBE PERCUT W/FLUORO  11/14/2016  . LAPAROTOMY N/A 01/20/2015   Procedure: EXPLORATORY LAPAROTOMY;  Surgeon: Coralie Keens, MD;  Location: [facility];  Service: General;  Laterality: N/A;  . LOWER EXTREMITY ANGIOGRAPHY Left 09/21/2016   Procedure: Lower Extremity Angiography;  Surgeon: Waynetta Sandy, MD;  Location: [facility];  Service: Cardiovascular;  Laterality: Left;  . LOWER EXTREMITY ANGIOGRAPHY Right 06/27/2017   Procedure: Lower Extremity Angiography;  Surgeon: Waynetta Sandy, MD;  Location: [facility];  Service: Cardiovascular;  Laterality: Right;  . LYSIS OF ADHESION N/A 01/20/2015   Procedure: LYSIS OF ADHESIONS < 1 HOUR;  Surgeon: Coralie Keens, MD;  Location: [facility];  Service: General;  Laterality: N/A;  . PERIPHERAL VASCULAR BALLOON ANGIOPLASTY Left 09/21/2016   Procedure: Peripheral Vascular Balloon Angioplasty;  Surgeon: Waynetta Sandy, MD;  Location: [facility];  Service: Cardiovascular;  Laterality: Left;  drug coated balloon  . PERIPHERAL VASCULAR BALLOON ANGIOPLASTY Right 06/27/2017   Procedure: PERIPHERAL VASCULAR BALLOON ANGIOPLASTY;  Surgeon: Waynetta Sandy, MD;  Location: [facility];  Service: Cardiovascular;  Laterality: Right;  SFA/[id number]  . TONSILLECTOMY    . TRANSMETATARSAL AMPUTATION Right 11/14/2019   Procedure: TRANSMETATARSAL AMPUTATION RIGHT FOOT;  Surgeon: Edrick Kins, DPM;  Location: [facility];  Service: Podiatry;  Laterality: Right;  . TUBAL LIGATION    . VENTRAL HERNIA REPAIR  2015   incarcerated ventral hernia ([facility] 09/2013)/notes 01/10/2015    Allergies  Allergen Reactions  . Penicillins Itching, Rash and Other (See Comments)    Tolerated amoxicillin, unasyn, zosyn & cephalosporins in the past. Did it involve swelling of the face/tongue/throat, SOB, or low BP? No Did it involve sudden or severe rash/hives, skin peeling, or any reaction on the inside of your  mouth or nose? No Did you need to seek medical attention at a hospital or doctor's office? Unknown When did it last happen?5+ years If all above answers are "NO", may proceed with cephalosporin use.     Outpatient Encounter Medications as of 07/05/2020  Medication Sig  . acetaminophen (TYLENOL) 500 MG tablet Take 500 mg by mouth every 6 (six) hours as needed.  [name] amLODipine (NORVASC) 5 MG tablet Take 1 tablet (5 mg  total) by mouth daily.  [name] atorvastatin (LIPITOR) 10 MG tablet TAKE 1 TABLET BY MOUTH EVERY DAY  . b complex vitamins tablet Take 1 tablet by mouth daily.   . benzocaine (AMERICAINE) 20 % rectal ointment Place rectally every 3 (three) hours as needed for pain.  . calcium-vitamin D (OSCAL WITH D) 500-200 MG-UNIT per tablet Take 1 tablet by mouth daily with breakfast.   . Carboxymethylcellulose Sodium (ARTIFICIAL TEARS OP) Place 1 drop into both eyes 4 (four) times daily.  . cycloSPORINE (RESTASIS) 0.05 % ophthalmic emulsion USE 1 DROP INTO BOTH EYES TWICE DAILY  . DULERA 200-5 MCG/ACT AERO TAKE TWO PUFFS BY MOUTH TWICE DAILY  . DULoxetine (CYMBALTA) 30 MG capsule TAKE 1 CAPSULE BY MOUTH EVERY DAY  . ferrous sulfate 220 (44 Fe) MG/5ML solution TAKE 7.4 MLS (325 MG TOTAL) BY MOUTH DAILY.  . fluticasone (FLONASE) 50 MCG/ACT nasal spray SPRAY 2 SPRAYS INTO EACH NOSTRIL EVERY DAY  . furosemide (LASIX) 40 MG tablet Take 0.5-1 tablets (20-40 mg total) by mouth See admin instructions. Take 20 mg daily, may instead take 40 mg daily as needed for edema  . gabapentin (NEURONTIN) 300 MG capsule Take 1 capsule (300 mg total) by mouth 2 (two) times daily.  [name] gentamicin cream (GARAMYCIN) 0.1 % Apply 1 application topically daily as needed (dry skin).  . hydrALAZINE (APRESOLINE) 50 MG tablet TAKE 1 TABLET BY MOUTH THREE TIMES A DAY  . insulin aspart (NOVOLOG) 100 UNIT/ML FlexPen Inject into the skin as needed for high blood sugar. New sliding Scale If BS is 151 - 251 = 2 units,  If BS is 251 -   350 = 4 units,  If BS is 351 - 450 = 6 units Call provider if BS runs more than 400  . Insulin Pen Needle (B-D UF III MINI PEN NEEDLES) 31G X 5 MM MISC Use twice daily with giving insulin injections. Dx E11.9  . ipratropium-albuterol (DUONEB) 0.5-2.5 (3) MG/3ML SOLN Take 3 mLs by nebulization every 6 (six) hours as needed (shortness of breath).  . isosorbide mononitrate (IMDUR) 30 MG 24 hr tablet TAKE 2 TABLETS BY MOUTH DAILY. PLEASE MAKE ANNUAL APPT WITH DR. Meda Coffee FOR FUTURE REFILLS  . LANTUS SOLOSTAR 100 UNIT/ML Solostar Pen INJECT 8 UNITS INTO THE SKIN AT BEDTIME.  [name] latanoprost (XALATAN) 0.005 % ophthalmic solution 1 drop at bedtime.  . levalbuterol (XOPENEX HFA) 45 MCG/ACT inhaler Inhale 2 puffs into the lungs every 6 (six) hours as needed for wheezing.  [name] levothyroxine (SYNTHROID) 125 MCG tablet TAKE 1 TABLET BY MOUTH EVERY DAY  . loratadine (CLARITIN) 10 MG tablet Take 10 mg by mouth daily as needed for allergies.  . Multiple Vitamin (MULTIVITAMIN WITH MINERALS) TABS tablet Take 1 tablet by mouth daily.  . Nutritional Supplements (FEEDING SUPPLEMENT, OSMOLITE 1.5 CAL,) LIQD Place 711 mLs into feeding tube See admin instructions. Take 711 ml over a 12 hour period per day  . nystatin (MYCOSTATIN/NYSTOP) powder Apply 1 application topically as needed (skin irritation).  . nystatin cream (MYCOSTATIN) APPLY TO AFFECTED AREA TWICE A DAY  . oxyCODONE (ROXICODONE) 5 MG immediate release tablet Take 1 tablet (5 mg total) by mouth every 4 (four) hours as needed for severe pain. May take 1/2 tab q4hrs as needed for mild pain.  . pantoprazole (PROTONIX) 40 MG tablet TAKE 1 TABLET BY MOUTH EVERY DAY  . polyethylene glycol (MIRALAX / GLYCOLAX) 17 g packet Take 17 g by mouth daily as needed for mild constipation.  [name]  Probiotic Product (EQL PROBIOTIC COLON SUPPORT PO) Take 1 capsule by mouth daily.  [name] senna-docusate (SENOKOT-S) 8.6-50 MG tablet Take 1 tablet by mouth as needed for mild constipation.  [name]  warfarin (COUMADIN) 2.5 MG tablet TAKE 2-3 TABLETS BY MOUTH DAILY AS DIRECTED BY COUMADIN CLINIC.  [name] Water For Irrigation, Sterile (FREE WATER) SOLN Place 180 mLs into feeding tube 3 (three) times daily.   Dema Severin Petrolatum-Mineral Oil (SYSTANE NIGHTTIME) OINT Place 1 application into both eyes at bedtime.    No facility-administered encounter medications on file as of 07/05/2020.    Review of Systems  Constitutional: Negative for appetite change, chills, fatigue and fever.  Respiratory: Negative for cough, chest tightness, shortness of breath and wheezing.   Cardiovascular: Negative for chest pain, palpitations and leg swelling.  Gastrointestinal: Negative for abdominal distention, abdominal pain, constipation, diarrhea, nausea and vomiting.  Musculoskeletal: Positive for gait problem. Negative for joint swelling and myalgias.  Skin: Negative for color change, pallor and rash.       Open area near  PEG tube site   Psychiatric/Behavioral: Negative for agitation, behavioral problems and confusion. The patient is not nervous/anxious.     Immunization History  Administered Date(s) Administered  . Fluad Quad(high Dose 65+) 12/02/2018, 12/30/2019  . Influenza, High Dose Seasonal PF 01/19/2017  . Influenza,inj,Quad PF,6+ Mos 12/24/2013, 01/27/2015, 12/29/2015, 12/04/2017  . Influenza-Unspecified 12/02/2012  . Janssen (J&J) SARS-COV-2 Vaccination 07/11/2019  . Pneumococcal Conjugate-13 02/03/2014  . Pneumococcal Polysaccharide-23 08/31/2016  . Tdap 07/18/2016  . Zoster 04/12/2011   Pertinent  Health Maintenance Due  Topic Date Due  . FOOT EXAM  10/19/2020  . HEMOGLOBIN A1C  10/28/2020  . INFLUENZA VACCINE  11/01/2020  . OPHTHALMOLOGY EXAM  02/08/2021  . DEXA SCAN  Completed  . PNA vac Low Risk Adult  Completed   Fall Risk  07/05/2020 11/07/2019 10/21/2019 10/20/2019 09/11/2019  Falls in the past year? 0 0 0 0 0  Number falls in past yr: 0 0 0 0 0  Injury with Fall? 0 0 0 - 0  Risk for  fall due to : - - - - -  Follow up - - - - -   Functional Status Survey:    Vitals:   07/05/20 1339  BP: 140/62  Pulse: 88  Resp: 16  Temp: 97.7 F (36.5 C)  SpO2: 98%  Weight: 129 lb 12.8 oz (58.9 kg)  Height: 5\' 6"  (1.676 m)   Body mass index is 20.95 kg/m. Physical Exam Vitals reviewed.  Constitutional:      General: She is not in acute distress.    Appearance: She is not ill-appearing.  HENT:     Mouth/Throat:     Mouth: Mucous membranes are moist.     Pharynx: Oropharynx is clear. No oropharyngeal exudate or posterior oropharyngeal erythema.  Eyes:     General: No scleral icterus.       Right eye: No discharge.        Left eye: No discharge.     Conjunctiva/sclera: Conjunctivae normal.     Pupils: Pupils are equal, round, and reactive to light.  Cardiovascular:     Rate and Rhythm: Normal rate and regular rhythm.     Pulses: Normal pulses.     Heart sounds: Normal heart sounds. No murmur heard. No friction rub. No gallop.   Pulmonary:     Effort: Pulmonary effort is normal. No respiratory distress.     Breath sounds: Normal breath sounds. No wheezing, rhonchi  or rales.  Chest:     Chest wall: No tenderness.  Abdominal:     General: Bowel sounds are normal. There is no distension.     Palpations: Abdomen is soft. There is no mass.     Tenderness: There is no right CVA tenderness, left CVA tenderness, guarding or rebound.     Comments: Left upper quadrant PEG tube small open area 1 cm from PEG tube at 10 O'clock position of PEG tube.tender to touch surrounding skin without any signs of infection though states Left LUQ seems to be hard   Musculoskeletal:        General: No swelling or tenderness.     Right lower leg: No edema.     Left lower leg: No edema.     Comments: Unsteady gait ambulates with Rolator   Skin:    General: Skin is warm and dry.     Coloration: Skin is not pale.     Findings: No bruising, erythema or rash.  Neurological:     Mental  Status: She is alert and oriented to person, place, and time.     Cranial Nerves: No cranial nerve deficit.     Motor: No weakness.     Gait: Gait abnormal.  Psychiatric:        Mood and Affect: Mood normal.        Behavior: Behavior normal.        Thought Content: Thought content normal.     Labs reviewed: Recent Labs    08/27/19 2016 08/28/19 0101 11/15/19 0506 04/22/20 1232 05/01/20 0635 05/07/20 1436 06/02/20 1126  NA  --    < > 134*   < > 143 138 134*  K  --    < > 4.3   < > 3.7 4.5 4.5  CL  --    < > 100   < > 109 101 99  CO2  --    < > 24   < > 22 25 26   GLUCOSE  --    < > 162*   < > 164* 100 102*  BUN  --    < > 37*   < > 43* 48* 52*  CREATININE  --    < > 1.37*   < > 1.28* 1.53* 1.43*  CALCIUM  --    < > 9.3   < > 9.0 9.6 9.4  MG 2.3  --  2.1  --   --   --   --   PHOS  --   --  4.4  --   --   --   --    < > = values in this interval not displayed.   Recent Labs    04/29/20 1827 04/30/20 0538 05/01/20 0635 05/07/20 1436  AST 34 31 31 25   ALT 24 22 20 20   ALKPHOS 77 73 65  --   BILITOT 1.1 0.8 0.6 0.5  PROT 8.5* 7.9 7.4 7.5  ALBUMIN 3.9 3.7 3.2*  --    Recent Labs    11/07/19 1454 11/15/19 0506 04/22/20 1232 04/30/20 0538 05/01/20 0635 05/07/20 1436  WBC 5.1 4.9   < > 5.6 5.1 5.7  NEUTROABS 2,917 4.4  --   --   --  3,728  HGB 9.7* 9.3*   < > 10.5* 10.5* 10.5*  HCT 29.9* 27.7*   < > 31.9* 32.8* 32.2*  MCV 91.7 90.2   < > 96.4 98.5 96.1  PLT 223 193   < >  239 223 225   < > = values in this interval not displayed.   Lab Results  Component Value Date   TSH 1.428 08/28/2019   Lab Results  Component Value Date   HGBA1C 6.2 (H) 04/30/2020   Lab Results  Component Value Date   CHOL 131 10/15/2018   HDL 47 (L) 10/15/2018   LDLCALC 58 10/15/2018   TRIG 188 (H) 10/15/2018   CHOLHDL 2.8 10/15/2018    Significant Diagnostic Results in last 30 days:  No results found.  Assessment/Plan  1. Left upper quadrant abdominal pain Afebrile.reports  constipation but had large BM yesterday. Will obtain plain film to rule out impaction and other abnormalities.   - DG Abd 1 View- advised to get X-ray get abdominal X-ray at [place] at [facility] then will call you with results.   2. PEG (percutaneous endoscopic gastrostomy) status ([place]) Left upper quadrant PEG tube small open area 1 cm from PEG tube at 10 O'clock position of PEG tube.tender to touch surrounding skin without any signs of infection though states Left LUQ seems to be hard. Area cleanse with saline,pat dry,Triple antibiotic ointment applied then PEG tube 4 X 4 gauze applied and secured with paper tape.open area seems to be pressure from PEG tube verse an open pustule.advised to continue to cleanse area as above and apply TAB.Notify provider's office if symptoms worsen or fail to improve. - DG Abd 1 View  Family/ staff Communication: Reviewed plan of care with patient and daughter verbalized understanding.   Labs/tests ordered: None   Next Appointment: As needed if symptoms worsen or fail to improve    Sandrea Hughs, NP

## 2020-07-05 NOTE — Patient Instructions (Addendum)
-   continue on Tylenol as needed for pain  - cleanse PEG tube site with saline,pat dry and apply Triple antibiotic ointment to open are and cover with drain gauze change dressing daily and as needed if soiled.  Notify provider if running any fever,chills or symptoms worsen   - Please get abdominal X-ray at [place] at [facility] then will call you with results.

## 2020-07-06 ENCOUNTER — Ambulatory Visit
Admission: RE | Admit: 2020-07-06 | Discharge: 2020-07-06 | Disposition: A | Discharge status: Home / Self Care | Payer: Medicare Other | Source: Ambulatory Visit | Attending: Family | Admitting: Family

## 2020-07-06 ENCOUNTER — Ambulatory Visit (INDEPENDENT_AMBULATORY_CARE_PROVIDER_SITE_OTHER): Payer: Medicare Other | Admitting: *Deleted

## 2020-07-06 DIAGNOSIS — I4891 Unspecified atrial fibrillation: Secondary | ICD-10-CM | POA: Diagnosis not present

## 2020-07-06 DIAGNOSIS — I4892 Unspecified atrial flutter: Secondary | ICD-10-CM

## 2020-07-06 DIAGNOSIS — Z5181 Encounter for therapeutic drug level monitoring: Secondary | ICD-10-CM | POA: Diagnosis not present

## 2020-07-06 DIAGNOSIS — R109 Unspecified abdominal pain: Secondary | ICD-10-CM | POA: Diagnosis not present

## 2020-07-06 LAB — POCT INR: INR: 4.3 — AB (ref 2.0–3.0)

## 2020-07-06 NOTE — Patient Instructions (Signed)
Description   Hold warfarin today and hold tomorrow's dose of Warfarin then start taking 2 tablets daily except for 3 tablets on Sunday and Thursday. Recheck INR in 2 weeks. Recheck INR in 2 weeks. [facility] [phone].

## 2020-07-07 ENCOUNTER — Other Ambulatory Visit: Payer: Self-pay | Admitting: *Deleted

## 2020-07-07 NOTE — Patient Outreach (Signed)
[facility] Shannon [name]) Care Management  07/07/2020  Shannon [name] June 05, 1923 [id number]   Outgoing call placed to member, she state she is having new issues with feeding tube site.  Report there was a knot at the site with some green drainage. He provider has been notified,  Office visit on 4/5 and abdominal xray complete, awaiting results.  She has been applying Neosporin to the site per their recommendations, denies fever or other signs of infection.    She also reports concern for constipation, has been taking stool softeners and Mirilax, some relief today.  State foot wound is now healed, but continue to put cream on it for a barrier.  Will follow up with podiatrist on 5/18.  Denies any urgent concerns, encouraged to contact this care manager with questions.  Agrees to follow up within the next month.  Goals Addressed            This Visit's Progress   . Remove Barriers to Better Nutrition   On track    Timeframe:  Long-Range Goal Priority:  Medium Start Date:          4/6 - Restarted                Expected End Date:      6/6             - make food easier to swallow by adding gravy or drinking water between bites - take my supplements as prescribed    Why is this important?    Eating healthy is not always easy.   You may not feel hungry or food might not taste good.   It may be a lot of work to shop or make meals.   There are things you can do to make it easier.    Notes:   2/7 - feeding tube to be replaced on 2/9  3/8 - Confirmed tube is replaced, no leaking  4/6 - New issues with tube, awaiting test results for possible infection    . [name] - Make and Keep All Appointments   On track      Timeframe:  Short-Term Goal Priority:  Medium Start Date:       4/6               Expected End Date:     5/6                    - ask family or friend for a ride - call to cancel if needed - keep a calendar with appointment dates    Why is this important?   Part  of staying healthy is seeing the doctor for follow-up care.  If you forget your appointments, there are some things you can do to stay on track.    Notes:   11/22 - Upcoming appointments reviewed with member  12/21 - reminded of follow up with podiatrist  2/7 - Reviewed upcoming appointments with member, PCP on 3/9, podiatrist on 3/16  4/6 - Encouraged to follow up with GI regarding feeding tube and test results    . COMPLETED: [name] - Perform Foot Care       Follow Up Date 06/08/2020  Timeframe:  Long-Range Goal Priority:  High Start Date:        2/7                  Expected End Date:    4/7                -  check feet daily for cuts, sores or redness - wash and dry feet carefully every day - wear comfortable, cotton socks - wear comfortable, well-fitting shoes    Why is this important?   Good foot care is very important when you have diabetes.  There are many things you can do to keep your feet healthy and catch a problem early.    Notes:   11/22 - Reviewed updated wound care per most recent MD visit  12/21 - Discussed ongoing wound care with member  2/7 - Reminded of wound care per podiatrist, encouraged to keep wound clean and dry       Kemper Durie, RN, MSN [facility] Care Management  [name] Manager [phone]

## 2020-07-20 ENCOUNTER — Ambulatory Visit (INDEPENDENT_AMBULATORY_CARE_PROVIDER_SITE_OTHER): Payer: Medicare Other | Admitting: *Deleted

## 2020-07-20 ENCOUNTER — Other Ambulatory Visit: Payer: Self-pay

## 2020-07-20 DIAGNOSIS — Z5181 Encounter for therapeutic drug level monitoring: Secondary | ICD-10-CM

## 2020-07-20 DIAGNOSIS — I4892 Unspecified atrial flutter: Secondary | ICD-10-CM | POA: Diagnosis not present

## 2020-07-20 DIAGNOSIS — I4891 Unspecified atrial fibrillation: Secondary | ICD-10-CM | POA: Diagnosis not present

## 2020-07-20 LAB — POCT INR: INR: 2.9 (ref 2.0–3.0)

## 2020-07-20 NOTE — Patient Instructions (Signed)
Description   Continue taking 2 tablets daily except for 3 tablets on Sundays and Thursdays. Recheck INR in 3 weeks. Coumadin Clinic [phone].

## 2020-08-03 ENCOUNTER — Other Ambulatory Visit: Payer: Self-pay | Admitting: Cardiology

## 2020-08-04 ENCOUNTER — Other Ambulatory Visit: Payer: Self-pay | Admitting: *Deleted

## 2020-08-04 NOTE — Patient Outreach (Signed)
[facility]) Care Management  08/04/2020  AKAYLAH LALLEY 1923-10-09 [id number]   Outgoing call placed to member, no answer, unable to leave message as mailbox is full.  Will follow up within the next 3-4 business days.    Update:  Incoming call received back from member.  Report blood sugars and blood pressure remain controlled (today was 132 and 132/70).  Denies any urgent concerns, encouraged to contact this care manager with questions.  Agrees to follow up within the next month.  Goals Addressed            This Visit's Progress   . Remove Barriers to Better Nutrition   On track    Timeframe:  Long-Range Goal Priority:  Medium Start Date:          5/4            Expected End Date:      7/4  Barriers: Other Nutrition - Feeding tube     - make food easier to swallow by adding gravy or drinking water between bites - take my supplements as prescribed    Why is this important?    Eating healthy is not always easy.   You may not feel hungry or food might not taste good.   It may be a lot of work to shop or make meals.   There are things you can do to make it easier.    Notes:   2/7 - feeding tube to be replaced on 2/9  3/8 - Confirmed tube is replaced, no leaking  4/6 - New issues with tube, awaiting test results for possible infection  5/4 - Confirmed no infection, having intermittent constipation.  Uses an herbal tea as well as Sennekot and Miralax for relief    . [name] - Make and Keep All Appointments   On track      Timeframe:  Short-Term Goal Priority:  Medium Start Date:       5/4           Expected End Date:     6/4  Barriers: Knowledge                  - ask family or friend for a ride - call to cancel if needed - keep a calendar with appointment dates    Why is this important?   Part of staying healthy is seeing the doctor for follow-up care.  If you forget your appointments, there are some things you can do to stay on track.     Notes:   11/22 - Upcoming appointments reviewed with member  12/21 - reminded of follow up with podiatrist  2/7 - Reviewed upcoming appointments with member, PCP on 3/9, podiatrist on 3/16  4/6 - Encouraged to follow up with GI regarding feeding tube and test results  5/4 - Continues to have issues with feet, particularly left foot bunion.  Appointment with podiatrist on 5/18, will continue to use cream and contact provider if in need of sooner visit       [name], RN, MSN [place] Manager [phone]

## 2020-08-04 NOTE — Telephone Encounter (Signed)
Please keep refill just as currently written in her med list.  Give 30 tablets and 8 more refills. Thanks!

## 2020-08-10 ENCOUNTER — Ambulatory Visit (INDEPENDENT_AMBULATORY_CARE_PROVIDER_SITE_OTHER): Payer: Medicare Other | Admitting: *Deleted

## 2020-08-10 ENCOUNTER — Other Ambulatory Visit: Payer: Self-pay

## 2020-08-10 DIAGNOSIS — Z5181 Encounter for therapeutic drug level monitoring: Secondary | ICD-10-CM | POA: Diagnosis not present

## 2020-08-10 DIAGNOSIS — I4891 Unspecified atrial fibrillation: Secondary | ICD-10-CM | POA: Diagnosis not present

## 2020-08-10 DIAGNOSIS — I4892 Unspecified atrial flutter: Secondary | ICD-10-CM | POA: Diagnosis not present

## 2020-08-10 LAB — POCT INR: INR: 3 (ref 2.0–3.0)

## 2020-08-10 NOTE — Patient Instructions (Signed)
Description   Today take 1 tablet then continue taking 2 tablets daily except for 3 tablets on Sundays and Thursdays. Recheck INR in 3 weeks. Coumadin Clinic [phone].

## 2020-08-18 ENCOUNTER — Other Ambulatory Visit: Payer: Self-pay | Admitting: Nurse Practitioner

## 2020-08-18 ENCOUNTER — Ambulatory Visit (INDEPENDENT_AMBULATORY_CARE_PROVIDER_SITE_OTHER): Payer: Medicare Other | Admitting: Podiatry

## 2020-08-18 ENCOUNTER — Other Ambulatory Visit: Payer: Self-pay

## 2020-08-18 DIAGNOSIS — E0859 Diabetes mellitus due to underlying condition with other circulatory complications: Secondary | ICD-10-CM

## 2020-08-18 DIAGNOSIS — I739 Peripheral vascular disease, unspecified: Secondary | ICD-10-CM

## 2020-08-18 DIAGNOSIS — L97512 Non-pressure chronic ulcer of other part of right foot with fat layer exposed: Secondary | ICD-10-CM

## 2020-08-18 NOTE — Progress Notes (Signed)
Subjective:  Patient presents today status post transmetatarsal amputation of the right foot. DOS: 11/14/2019.  Patient continues to have some pain associated to the bilateral feet. She presents for further treatment and evaluation  Past Medical History:  Diagnosis Date  . Acute respiratory failure ([place])   . Anemia    previous blood transfusions  . Arthritis    "all over"  . Asthma   . Atrial flutter ([place])   . Bradycardia    requiring previous d/c of BB and reduction of amiodarone  . CAD (coronary artery disease)    nonobstructive per notes  . Chronic diastolic CHF (congestive heart failure) ([place])   . CKD (chronic kidney disease), stage III ([place])   . Complication of blood transfusion    "got the wrong blood type at [facility] in ~ 2015; no adverse reaction that we are aware of"/daughter, [name] (01/27/2016)  . COPD (chronic obstructive pulmonary disease) ([place])   . Depression    "light case"  . DVT (deep venous thrombosis) ([place]) 01/2016   a. LLE DVT 01/2016 - switched from Eliquis to Coumadin.  [name] Dyspnea    with some activity  . Gastric stenosis    a. s/p stomach tube  . GERD (gastroesophageal reflux disease)   . Headache   . History of 2019 novel coronavirus disease (COVID-19)   . History of blood transfusion    "several" (01/27/2016)  . History of stomach ulcers   . Hyperlipidemia   . Hypertension   . Hypothyroidism   . On home oxygen therapy    "2L; 9PM til 9AM" (06/27/2017)  . PAD (peripheral artery disease) ([place])    a. prior gangrene, toe amputations, intervention.  [name] PAF (paroxysmal atrial fibrillation) ([place])   . Paraesophageal hernia   . Perforated gastric ulcer ([place])   . Pneumonia    "a few times" (06/27/2017)  . Seasonal allergies   . SIADH (syndrome of inappropriate ADH production) ([place])    [name] 01/10/2015  . Small bowel obstruction ([place])    "I don't know how many" (01/11/2015)  . Stroke [facility])    several- no residual  . Type II diabetes mellitus ([place])     "related to prednisone use  for > 20 yr; once predinose stopped; no more DM RX" (01/27/2016)  . UTI (urinary tract infection) 02/08/2016  . Ventral hernia with bowel obstruction     Objective: Physical Exam General: The patient is alert and oriented x3 in no acute distress.  Dermatology: Wound noted to the distal lateral aspect of the right forefoot stump measuring approximately 0.2 x 0.2 x 0.1 cm.  To the noted ulceration there is no eschar.  There is a moderate amount of slough fibrin and necrotic tissue noted.  Granulation tissue and wound base is red.  There is no exposed bone muscle tendon ligament or joint.  No malodor noted.  Periwound integrity is callused.   There is also some preulcerative callus tissue noted to the left foot bunion.  Occasionally this needs to be trimmed.  Today she is requesting to have it trimmed.  Vascular: Palpable pedal pulses bilaterally.  Negative for any significant erythema or edema to the leg.  Neurological: Epicritic and protective threshold diminished bilaterally.   Musculoskeletal Exam: Status post TMA right foot.  Hallux valgus left.  Second toe amputation left.  Assessment: 1. s/p transmetatarsal amputation right foot. DOS: 11/14/2019 2.  Ulcer right lateral forefoot amputation stump 3.  Hallux valgus left. H/o 2nd toe amputation left  Plan of Care:  1. Patient was evaluated.  2.  Medically necessary excisional debridement including subcutaneous tissue was performed using a tissue nipper with post debridement measurement same as pre-.  Excisional debridement of all the necrotic nonviable tissue down to the healthy bleeding viable tissue was performed 3.  Continue Betadine daily and Silvadene cream intermittently 4.  Continue wearing diabetic shoes and insoles.  Offloading metatarsal pads were adjusted today to offload pressure from the forefoot. Additional offloading was applied to the insoles.  5.  Excisional debridement of the preulcerative  callus tissue overlying the bunion of the left foot was performed using a chisel blade without incident or bleeding.   6.  Return to clinic 4 weeks   Edrick Kins, DPM [facility]  Dr. Edrick Kins, DPM    [address], [place] [address]                Office [phone]  Fax [phone]

## 2020-08-22 DIAGNOSIS — I959 Hypotension, unspecified: Secondary | ICD-10-CM | POA: Diagnosis not present

## 2020-08-22 DIAGNOSIS — R55 Syncope and collapse: Secondary | ICD-10-CM | POA: Diagnosis not present

## 2020-08-22 DIAGNOSIS — R531 Weakness: Secondary | ICD-10-CM | POA: Diagnosis not present

## 2020-08-22 DIAGNOSIS — R29810 Facial weakness: Secondary | ICD-10-CM | POA: Diagnosis not present

## 2020-08-27 ENCOUNTER — Encounter: Payer: Self-pay | Admitting: Podiatry

## 2020-08-27 ENCOUNTER — Ambulatory Visit (INDEPENDENT_AMBULATORY_CARE_PROVIDER_SITE_OTHER): Payer: Medicare Other | Admitting: Podiatry

## 2020-08-27 ENCOUNTER — Emergency Department (HOSPITAL_COMMUNITY)
Admission: EM | Admit: 2020-08-27 | Discharge: 2020-08-28 | Disposition: A | Discharge status: Home / Self Care | Payer: Medicare Other | Attending: Emergency Medicine | Admitting: Emergency Medicine

## 2020-08-27 ENCOUNTER — Encounter (HOSPITAL_COMMUNITY): Payer: Self-pay | Admitting: *Deleted

## 2020-08-27 ENCOUNTER — Emergency Department (EMERGENCY_DEPARTMENT_HOSPITAL): Payer: Medicare Other

## 2020-08-27 ENCOUNTER — Ambulatory Visit (INDEPENDENT_AMBULATORY_CARE_PROVIDER_SITE_OTHER): Payer: Medicare Other

## 2020-08-27 ENCOUNTER — Other Ambulatory Visit: Payer: Self-pay

## 2020-08-27 ENCOUNTER — Telehealth: Payer: Self-pay | Admitting: *Deleted

## 2020-08-27 ENCOUNTER — Emergency Department (HOSPITAL_COMMUNITY): Payer: Medicare Other

## 2020-08-27 ENCOUNTER — Other Ambulatory Visit: Payer: Self-pay | Admitting: Podiatry

## 2020-08-27 VITALS — Temp 99.0°F

## 2020-08-27 DIAGNOSIS — Z8616 Personal history of COVID-19: Secondary | ICD-10-CM | POA: Diagnosis not present

## 2020-08-27 DIAGNOSIS — L03116 Cellulitis of left lower limb: Secondary | ICD-10-CM | POA: Insufficient documentation

## 2020-08-27 DIAGNOSIS — E1122 Type 2 diabetes mellitus with diabetic chronic kidney disease: Secondary | ICD-10-CM | POA: Insufficient documentation

## 2020-08-27 DIAGNOSIS — I2510 Atherosclerotic heart disease of native coronary artery without angina pectoris: Secondary | ICD-10-CM | POA: Diagnosis not present

## 2020-08-27 DIAGNOSIS — Z79899 Other long term (current) drug therapy: Secondary | ICD-10-CM | POA: Diagnosis not present

## 2020-08-27 DIAGNOSIS — R609 Edema, unspecified: Secondary | ICD-10-CM | POA: Diagnosis not present

## 2020-08-27 DIAGNOSIS — L97512 Non-pressure chronic ulcer of other part of right foot with fat layer exposed: Secondary | ICD-10-CM

## 2020-08-27 DIAGNOSIS — L02619 Cutaneous abscess of unspecified foot: Secondary | ICD-10-CM

## 2020-08-27 DIAGNOSIS — I739 Peripheral vascular disease, unspecified: Secondary | ICD-10-CM

## 2020-08-27 DIAGNOSIS — L03119 Cellulitis of unspecified part of limb: Secondary | ICD-10-CM | POA: Diagnosis not present

## 2020-08-27 DIAGNOSIS — J449 Chronic obstructive pulmonary disease, unspecified: Secondary | ICD-10-CM | POA: Insufficient documentation

## 2020-08-27 DIAGNOSIS — I5032 Chronic diastolic (congestive) heart failure: Secondary | ICD-10-CM | POA: Insufficient documentation

## 2020-08-27 DIAGNOSIS — Z20822 Contact with and (suspected) exposure to covid-19: Secondary | ICD-10-CM | POA: Diagnosis not present

## 2020-08-27 DIAGNOSIS — E039 Hypothyroidism, unspecified: Secondary | ICD-10-CM | POA: Diagnosis not present

## 2020-08-27 DIAGNOSIS — I130 Hypertensive heart and chronic kidney disease with heart failure and stage 1 through stage 4 chronic kidney disease, or unspecified chronic kidney disease: Secondary | ICD-10-CM | POA: Insufficient documentation

## 2020-08-27 DIAGNOSIS — J45909 Unspecified asthma, uncomplicated: Secondary | ICD-10-CM | POA: Diagnosis not present

## 2020-08-27 DIAGNOSIS — R2242 Localized swelling, mass and lump, left lower limb: Secondary | ICD-10-CM | POA: Diagnosis present

## 2020-08-27 DIAGNOSIS — Z794 Long term (current) use of insulin: Secondary | ICD-10-CM | POA: Diagnosis not present

## 2020-08-27 DIAGNOSIS — Z7901 Long term (current) use of anticoagulants: Secondary | ICD-10-CM | POA: Insufficient documentation

## 2020-08-27 DIAGNOSIS — E0859 Diabetes mellitus due to underlying condition with other circulatory complications: Secondary | ICD-10-CM | POA: Diagnosis not present

## 2020-08-27 DIAGNOSIS — N183 Chronic kidney disease, stage 3 unspecified: Secondary | ICD-10-CM | POA: Diagnosis not present

## 2020-08-27 DIAGNOSIS — M7989 Other specified soft tissue disorders: Secondary | ICD-10-CM | POA: Diagnosis not present

## 2020-08-27 DIAGNOSIS — M79605 Pain in left leg: Secondary | ICD-10-CM | POA: Diagnosis not present

## 2020-08-27 LAB — CBC WITH DIFFERENTIAL/PLATELET
Abs Immature Granulocytes: 0.02 10*3/uL (ref 0.00–0.07)
Basophils Absolute: 0 10*3/uL (ref 0.0–0.1)
Basophils Relative: 1 %
Eosinophils Absolute: 0.3 10*3/uL (ref 0.0–0.5)
Eosinophils Relative: 4 %
HCT: 28.4 % — ABNORMAL LOW (ref 36.0–46.0)
Hemoglobin: 9.5 g/dL — ABNORMAL LOW (ref 12.0–15.0)
Immature Granulocytes: 0 %
Lymphocytes Relative: 10 %
Lymphs Abs: 0.6 10*3/uL — ABNORMAL LOW (ref 0.7–4.0)
MCH: 32.1 pg (ref 26.0–34.0)
MCHC: 33.5 g/dL (ref 30.0–36.0)
MCV: 95.9 fL (ref 80.0–100.0)
Monocytes Absolute: 0.9 10*3/uL (ref 0.1–1.0)
Monocytes Relative: 14 %
Neutro Abs: 4.5 10*3/uL (ref 1.7–7.7)
Neutrophils Relative %: 71 %
Platelets: 220 10*3/uL (ref 150–400)
RBC: 2.96 MIL/uL — ABNORMAL LOW (ref 3.87–5.11)
RDW: 14.7 % (ref 11.5–15.5)
WBC: 6.3 10*3/uL (ref 4.0–10.5)
nRBC: 0 % (ref 0.0–0.2)

## 2020-08-27 LAB — BASIC METABOLIC PANEL
Anion gap: 8 (ref 5–15)
BUN: 52 mg/dL — ABNORMAL HIGH (ref 8–23)
CO2: 26 mmol/L (ref 22–32)
Calcium: 9.2 mg/dL (ref 8.9–10.3)
Chloride: 98 mmol/L (ref 98–111)
Creatinine, Ser: 1.46 mg/dL — ABNORMAL HIGH (ref 0.44–1.00)
GFR, Estimated: 33 mL/min — ABNORMAL LOW (ref >60)
Glucose, Bld: 104 mg/dL — ABNORMAL HIGH (ref 70–99)
Potassium: 4.7 mmol/L (ref 3.5–5.1)
Sodium: 132 mmol/L — ABNORMAL LOW (ref 135–145)

## 2020-08-27 LAB — LACTIC ACID, PLASMA: Lactic Acid, Venous: 0.7 mmol/L (ref 0.5–1.9)

## 2020-08-27 MED ORDER — DOXYCYCLINE HYCLATE 100 MG PO TABS: 100.0000 mg | ORAL_TABLET | Freq: Two times a day (BID) | ORAL | 0 refills | Status: DC

## 2020-08-27 NOTE — Progress Notes (Signed)
Left lower extremity venous duplex completed. Refer to "CV Proc" under chart review to view preliminary results.  08/27/2020 7:09 PM Kelby Aline., MHA, RVT, RDCS, RDMS

## 2020-08-27 NOTE — Telephone Encounter (Signed)
Patient's daughter is calling with concerns that her mother's right great toe may be infected,is oozing clear drainage, bleeding, no odor,red streaking noticed and she is in a lot of pain from foot and going up into her leg. Please advise.  Patient has been scheduled for 12:15 today.

## 2020-08-27 NOTE — Addendum Note (Signed)
Addended by: Graceann Congress D on: 08/27/2020 02:07 PM   Modules accepted: Orders

## 2020-08-27 NOTE — ED Triage Notes (Signed)
Bi-lateral foot pain for months worse in the past 2 weeks

## 2020-08-27 NOTE — ED Provider Notes (Addendum)
Emergency Medicine Provider Triage Evaluation Note  Shannon [name] , a 87 y.o. female  was evaluated in triage.  Pt complains of left foot pain and swelling.  [facility], Dr. Amalia Hailey.  Was seen earlier today. Has had increased swelling, redness, warmth going up her leg.  Has history of rapid onset cellulitis previously.  He recommended emergency department evaluation.  History of DVT, per patient  Review of Systems  Positive: Left LE swelling, redness, warmth, pain Negative: Fever, chills, emesis  Physical Exam  There were no vitals taken for this visit. Gen:   Awake, no distress   CV:  1+ pulse DP left Resp:  Normal effort  MSK:   Moves extremities without difficulty.  Diffuse tenderness to the left lower extremity, amputation bandaged.  Second, third toe amputation left.  Transmetatarsal amputation right foot SKIN:  Diffuse edema, erythema, warmth to left lower extremity, streaking from foot to left mid lower extremity no fluctuance or induration Other:  Intact sensation  Medical Decision Making  Medically screening exam initiated at 5:32 PM.  Appropriate orders placed.  CARNISHA FELTZ was informed that the remainder of the evaluation will be completed by another provider, this initial triage assessment does not replace that evaluation, and the importance of remaining in the ED until their evaluation is complete.  Lower extremity pain.  we will plan on labs, imaging.  X-ray left foot done by podiatry today.  No bony lesions to suggest osteo, no gas-forming organism   Justina Bertini A, PA-C 08/27/20 1752    Tarrell Debes A, PA-C 08/27/20 1753    Breck Coons, MD 08/27/20 [phone]

## 2020-08-27 NOTE — Progress Notes (Signed)
Subjective:  Patient presents today status post transmetatarsal amputation of the right foot. DOS: 11/14/2019.  Patient states that since last visit on 08/18/2020 she has had an increased amount of pain and swelling to her left foot and ankle.  She states that she had to take a Percocet yesterday for the pain.  She presents today as an urgent work in for further treatment and evaluation  Past Medical History:  Diagnosis Date  . Acute respiratory failure ([place])   . Anemia    previous blood transfusions  . Arthritis    "all over"  . Asthma   . Atrial flutter ([place])   . Bradycardia    requiring previous d/c of BB and reduction of amiodarone  . CAD (coronary artery disease)    nonobstructive per notes  . Chronic diastolic CHF (congestive heart failure) ([place])   . CKD (chronic kidney disease), stage III ([place])   . Complication of blood transfusion    "got the wrong blood type at [facility] in ~ 2015; no adverse reaction that we are aware of"/daughter, [name] (01/27/2016)  . COPD (chronic obstructive pulmonary disease) ([place])   . Depression    "light case"  . DVT (deep venous thrombosis) ([place]) 01/2016   a. LLE DVT 01/2016 - switched from Eliquis to Coumadin.  [name] Dyspnea    with some activity  . Gastric stenosis    a. s/p stomach tube  . GERD (gastroesophageal reflux disease)   . Headache   . History of 2019 novel coronavirus disease (COVID-19)   . History of blood transfusion    "several" (01/27/2016)  . History of stomach ulcers   . Hyperlipidemia   . Hypertension   . Hypothyroidism   . On home oxygen therapy    "2L; 9PM til 9AM" (06/27/2017)  . PAD (peripheral artery disease) ([place])    a. prior gangrene, toe amputations, intervention.  [name] PAF (paroxysmal atrial fibrillation) ([place])   . Paraesophageal hernia   . Perforated gastric ulcer ([place])   . Pneumonia    "a few times" (06/27/2017)  . Seasonal allergies   . SIADH (syndrome of inappropriate ADH production) ([place])    [name]  01/10/2015  . Small bowel obstruction ([place])    "I don't know how many" (01/11/2015)  . Stroke [facility])    several- no residual  . Type II diabetes mellitus ([place])    "related to prednisone use  for > 20 yr; once predinose stopped; no more DM RX" (01/27/2016)  . UTI (urinary tract infection) 02/08/2016  . Ventral hernia with bowel obstruction     Objective: Physical Exam General: The patient is alert and oriented x3 in no acute distress.  Dermatology: Ulcer noted to the medial aspect of the first MTPJ left.  Drainage noted.  Vascular: Increased edema and erythema throughout the left foot extending up into the ankle and leg.  There is also some pain with calf compression.  Skin is warm to touch compared to the contralateral limb.  Neurological: Epicritic and protective threshold diminished bilaterally.   Musculoskeletal Exam: Status post TMA right foot.  Hallux valgus left.  Second and third toe amputation left.  Radiographic exam bilateral feet: Transmetatarsal amputation noted right foot.  Second and third toe amputations left.  No significant osseous erosions.  Left foot demonstrates history of left second and third toe amputation with resection of the metatarsal heads as well and hallux valgus deformity.  Diffuse degenerative changes noted.  No gas within the tissue.  No  cortical irregularity or osseous erosion noted.  Assessment: 1. s/p transmetatarsal amputation right foot. DOS: 11/14/2019 2.  Ulcer right lateral forefoot amputation stump 3.  Hallux valgus left. H/o 2nd toe amputation left  Plan of Care:  1. Patient was evaluated.  2.  Culture was taken today to the small ulcer medial aspect first MTPJ left. 3.  Prescription for doxycycline 100 mg 2 times daily 4.  Due to the increased erythema and edema that is extending up into the calf LLE, recommend that the patient goes to the emergency department for work-up, possible admission, and IV antibiotics.  She does have a history of  acute rapid onset of infection to the right lower extremity which is led to amputations. 5.  Return to clinic 10 days or within 1 week post discharge if the patient is admitted  Edrick Kins, DPM [facility]  Dr. Edrick Kins, DPM    [address], [place] [address]                Office [phone]  Fax [phone]

## 2020-08-28 DIAGNOSIS — L03116 Cellulitis of left lower limb: Secondary | ICD-10-CM | POA: Diagnosis not present

## 2020-08-28 LAB — RESP PANEL BY RT-PCR (FLU A&B, COVID) ARPGX2
Influenza A by PCR: NEGATIVE
Influenza B by PCR: NEGATIVE
SARS Coronavirus 2 by RT PCR: NEGATIVE

## 2020-08-28 MED ORDER — OXYCODONE-ACETAMINOPHEN 5-325 MG PO TABS
2.0000 | ORAL_TABLET | Freq: Once | ORAL | Status: AC
  Administered 2020-08-28: 2 via ORAL
  Filled 2020-08-28: qty 2

## 2020-08-28 MED ORDER — DOXYCYCLINE HYCLATE 100 MG PO TABS
100.0000 mg | ORAL_TABLET | Freq: Once | ORAL | Status: AC
  Administered 2020-08-28: 100 mg via ORAL
  Filled 2020-08-28: qty 1

## 2020-08-28 MED ORDER — WARFARIN SODIUM 2.5 MG PO TABS: 2.5000 mg | ORAL_TABLET | Freq: Once | ORAL | Status: DC

## 2020-08-28 NOTE — ED Notes (Signed)
RN messaged pharmacy for verification/sending of due warfarin

## 2020-08-28 NOTE — Discharge Instructions (Addendum)
You are seen today for increasing swelling and pain in the left lower extremity.  Your work-up does not show any evidence of blood clot.  Your lab works are reassuring.  Start doxycycline and follow-up with Dr. Amalia Hailey.  Also, follow-up with your primary physician for check of your INR.

## 2020-08-28 NOTE — ED Notes (Signed)
E-signature pad unavailable at time of pt discharge. This RN discussed discharge materials with pt and answered all pt questions. Pt stated understanding of discharge material. ? ?

## 2020-08-28 NOTE — ED Notes (Signed)
RN re-wrapped pt's feet with non-adhesive pads and coban

## 2020-08-28 NOTE — ED Provider Notes (Signed)
[facility] EMERGENCY DEPARTMENT Provider Note   CSN: [id number] Arrival date & time: 08/27/20  1728     History No chief complaint on file.   Shannon [name] is a 87 y.o. female.  HPI     This is a 87 year old female who presents with ongoing left foot pain and swelling.  Patient with history of diabetic foot ulcers and toe amputations.  She also has a history of rapid onset cellulitis.  She has had progressively worsening pain and swelling in the left lower extremity.  She was seen and evaluated by her podiatrist yesterday, Dr. Logan Bores who appreciated some likely early cellulitis.  She has a history significant for rapidly progressive cellulitis resulting in hospitalization and surgery.  While he prescribed her doxycycline as an outpatient, recommended she come for further evaluation in the ED.  She is not had any fevers or chills.  She has been taking Percocet at home for her pain.  Pain is worse with ambulation.  Chart reviewed.  She was seen in Dr. Logan Bores office yesterday.  She had a culture taken from her wound on the medial aspect of the left foot.  She also had x-rays that did not show any evidence of osteomyelitis.  Past Medical History:  Diagnosis Date  . Acute respiratory failure (HCC)   . Anemia    previous blood transfusions  . Arthritis    "all over"  . Asthma   . Atrial flutter (HCC)   . Bradycardia    requiring previous d/c of BB and reduction of amiodarone  . CAD (coronary artery disease)    nonobstructive per notes  . Chronic diastolic CHF (congestive heart failure) (HCC)   . CKD (chronic kidney disease), stage III (HCC)   . Complication of blood transfusion    "got the wrong blood type at [facility] in ~ 2015; no adverse reaction that we are aware of"/daughter, [name] (01/27/2016)  . COPD (chronic obstructive pulmonary disease) (HCC)   . Depression    "light case"  . DVT (deep venous thrombosis) (HCC) 01/2016   a. LLE DVT 01/2016 - switched  from Eliquis to Coumadin.  [name] Dyspnea    with some activity  . Gastric stenosis    a. s/p stomach tube  . GERD (gastroesophageal reflux disease)   . Headache   . History of 2019 novel coronavirus disease (COVID-19)   . History of blood transfusion    "several" (01/27/2016)  . History of stomach ulcers   . Hyperlipidemia   . Hypertension   . Hypothyroidism   . On home oxygen therapy    "2L; 9PM til 9AM" (06/27/2017)  . PAD (peripheral artery disease) (HCC)    a. prior gangrene, toe amputations, intervention.  [name] PAF (paroxysmal atrial fibrillation) (HCC)   . Paraesophageal hernia   . Perforated gastric ulcer (HCC)   . Pneumonia    "a few times" (06/27/2017)  . Seasonal allergies   . SIADH (syndrome of inappropriate ADH production) (HCC)    [name] 01/10/2015  . Small bowel obstruction (HCC)    "I don't know how many" (01/11/2015)  . Stroke [facility])    several- no residual  . Type II diabetes mellitus (HCC)    "related to prednisone use  for > 20 yr; once predinose stopped; no more DM RX" (01/27/2016)  . UTI (urinary tract infection) 02/08/2016  . Ventral hernia with bowel obstruction     Patient Active Problem List   Diagnosis Date Noted  .  Depression, major, single episode, complete remission (HCC) 06/09/2020  . History of transmetatarsal amputation of right foot (HCC) 11/14/2019  . Chest pain 08/27/2019  . Lab test positive for detection of COVID-19 virus 08/27/2019  . Acute encephalopathy 07/07/2019  . AMS (altered mental status) 07/07/2019  . Varicose veins of right lower extremity with ulcer other part of foot (HCC) 06/11/2019  . Jejunostomy tube fell out 03/02/2019  . S/P amputation of lesser toe, left (HCC) 02/13/2019  . BRBPR (bright red blood per rectum) 01/10/2019  . GI bleed 12/25/2018  . Bradycardia 11/25/2018  . Amputated great toe of right foot (HCC) 05/06/2018  . Peripheral vascular disease (HCC) 05/03/2018  . Localized, primary osteoarthritis of shoulder  region 01/21/2018  . PAD (peripheral artery disease) (HCC) 06/27/2017  . AKI (acute kidney injury) (HCC) 02/20/2017  . Complication of feeding tube (HCC) 02/20/2017  . PEG tube malfunction (HCC)   . COPD with asthma (HCC) 10/03/2016  . Chronic deep vein thrombosis (DVT) of tibial vein of left lower extremity (HCC) 10/03/2016  . Gangrene of toe of left foot (HCC) 09/15/2016  . Recurrent aspiration pneumonia (HCC)   . Atherosclerosis of native arteries of the extremities with gangrene (HCC) 07/28/2016  . Other specified hypothyroidism   . CHF (NYHA class IV, ACC/AHA stage D) (HCC)   . Shortness of breath   . Pressure injury of skin 03/11/2016  . PEG (percutaneous endoscopic gastrostomy) status (HCC) 03/10/2016  . Encounter for therapeutic drug monitoring 02/16/2016  . Warfarin anticoagulation   . Chronic seasonal allergic rhinitis   . Atrial fibrillation and flutter (HCC) 01/24/2016  . Chronic diastolic CHF (congestive heart failure) (HCC) 01/24/2016  . Bilateral leg edema 12/29/2015  . Hypothyroidism due to medication   . Weakness   . Pyloric stenosis   . Pressure ulcer 09/15/2015  . PVD (peripheral vascular disease) (HCC)   . Diabetic ulcer of toe of right foot associated with type 2 diabetes mellitus, with necrosis of muscle (HCC)   . Diverticulitis of large intestine without perforation or abscess without bleeding   . Colitis 08/26/2015  . Esophageal candidiasis (HCC)   . SOB (shortness of breath)   . Acute renal failure superimposed on stage 4 chronic kidney disease (HCC)   . Encounter for long-term (current) use of other high-risk medications   . Dysphagia 07/05/2015  . Asthma with allergic rhinitis 06/04/2015  . Hypertensive heart disease 03/18/2015  . Malnutrition of moderate degree 01/28/2015  . Syncope 01/27/2015  . Symptomatic bradycardia 01/21/2015  . Hypotension due to drugs 01/21/2015  . Protein-calorie malnutrition, severe 01/12/2015  . Hypertension associated  with diabetes (HCC) 01/12/2015  . Cerebrovascular disease 07/14/2014  . Diabetic polyneuropathy associated with type 2 diabetes mellitus (HCC) 07/14/2014  . Hyperlipidemia 07/14/2014  . Gastric outlet obstruction 05/21/2014  . Unstable gait 04/29/2014  . Slurred speech   . Gastroparesis   . Constipation 02/03/2014  . Carotid stenosis 02/03/2014  . Demand ischemia (HCC) 01/21/2014  . Gastric bezoar 01/20/2014  . Anemia of chronic disease 01/18/2014  . CKD (chronic kidney disease) stage 3, GFR 30-59 ml/min 01/18/2014  . Depression 09/29/2013  . Essential hypertension, benign 09/22/2013  . GERD (gastroesophageal reflux disease) 09/22/2013  . Hyperlipidemia associated with type 2 diabetes mellitus (HCC) 09/22/2013  . Type 2 diabetes mellitus with chronic kidney disease (HCC) 09/22/2013  . Persistent atrial fibrillation 09/22/2013  . History of completed stroke 09/22/2013  . Diabetic neuropathy (HCC) 09/22/2013  . Hypothyroidism 09/22/2013  . Chronic gastrojejunal  ulcer with perforation (HCC) 09/07/2013  . Accelerated hypertension 09/07/2013  . Other specified abnormal immunological findings in serum 09/07/2013  . Red blood cell antibody positive 09/07/2013    Past Surgical History:  Procedure Laterality Date  . ABDOMINAL AORTOGRAM N/A 09/21/2016   Procedure: Abdominal Aortogram;  Surgeon: Maeola Harman, MD;  Location: [facility] INVASIVE CV LAB;  Service: Cardiovascular;  Laterality: N/A;  . AMPUTATION Left 09/25/2016   Procedure: AMPUTATION DIGIT- LEFT 2ND AND 3RD TOES;  Surgeon: Larina Earthly, MD;  Location: [facility] OR;  Service: Vascular;  Laterality: Left;  . AMPUTATION Right 05/03/2018   Procedure: AMPUTATION RIGHT GREAT TOE;  Surgeon: Larina Earthly, MD;  Location: [facility] OR;  Service: Vascular;  Laterality: Right;  . CATARACT EXTRACTION W/ INTRAOCULAR LENS  IMPLANT, BILATERAL    . CHOLECYSTECTOMY OPEN    . COLECTOMY    . ESOPHAGOGASTRODUODENOSCOPY N/A 01/19/2014   Procedure:  ESOPHAGOGASTRODUODENOSCOPY (EGD);  Surgeon: Hilarie Fredrickson, MD;  Location: [facility] ENDOSCOPY;  Service: Endoscopy;  Laterality: N/A;  . ESOPHAGOGASTRODUODENOSCOPY N/A 01/20/2014   Procedure: ESOPHAGOGASTRODUODENOSCOPY (EGD);  Surgeon: Hilarie Fredrickson, MD;  Location: [facility] ENDOSCOPY;  Service: Endoscopy;  Laterality: N/A;  . ESOPHAGOGASTRODUODENOSCOPY N/A 03/19/2014   Procedure: ESOPHAGOGASTRODUODENOSCOPY (EGD);  Surgeon: Rachael Fee, MD;  Location: [facility] ENDOSCOPY;  Service: Endoscopy;  Laterality: N/A;  . ESOPHAGOGASTRODUODENOSCOPY N/A 07/08/2015   Procedure: ESOPHAGOGASTRODUODENOSCOPY (EGD);  Surgeon: Sherrilyn Rist, MD;  Location: [facility] ENDOSCOPY;  Service: Endoscopy;  Laterality: N/A;  . ESOPHAGOGASTRODUODENOSCOPY (EGD) WITH PROPOFOL N/A 09/15/2015   Procedure: ESOPHAGOGASTRODUODENOSCOPY (EGD) WITH PROPOFOL;  Surgeon: Ruffin Frederick, MD;  Location: [facility] ENDOSCOPY;  Service: Gastroenterology;  Laterality: N/A;  . GASTROJEJUNOSTOMY     hx/notes 01/10/2015  . GASTROJEJUNOSTOMY N/A 09/23/2015   Procedure: OPEN GASTROJEJUNOSTOMY TUBE PLACEMENT;  Surgeon: De Blanch Kinsinger, MD;  Location: [facility] ORS;  Service: General;  Laterality: N/A;  . GLAUCOMA SURGERY Bilateral   . HERNIA REPAIR  2015  . IR CM INJ ANY COLONIC TUBE W/FLUORO  01/05/2017  . IR CM INJ ANY COLONIC TUBE W/FLUORO  06/07/2017  . IR CM INJ ANY COLONIC TUBE W/FLUORO  11/05/2017  . IR CM INJ ANY COLONIC TUBE W/FLUORO  09/18/2018  . IR CM INJ ANY COLONIC TUBE W/FLUORO  03/06/2019  . IR CM INJ ANY COLONIC TUBE W/FLUORO  03/11/2019  . IR GASTR TUBE CONVERT GASTR-JEJ PER W/FL MOD SED  04/30/2020  . IR GENERIC HISTORICAL  01/07/2016   IR GJ TUBE CHANGE 01/07/2016 Malachy Moan, MD [facility]INTER[facility]  . IR GENERIC HISTORICAL  01/27/2016   IR MECH REMOV OBSTRUC MAT ANY COLON TUBE W/FLUORO 01/27/2016 Richarda Overlie, MD [facility]  . IR GENERIC HISTORICAL  02/07/2016   IR PATIENT EVAL TECH 0-60 MINS Darrell K Allred, PA-C [facility]  . IR GENERIC HISTORICAL   02/08/2016   IR GJ TUBE CHANGE 02/08/2016 Berdine Dance, MD [facility]  . IR GENERIC HISTORICAL  01/06/2016   IR GJ TUBE CHANGE 01/06/2016 [facility] OUT REF  . IR GENERIC HISTORICAL  05/02/2016   IR CM INJ ANY COLONIC TUBE W/FLUORO 05/02/2016 Oley Balm, MD [facility]  . IR GENERIC HISTORICAL  05/15/2016   IR GJ TUBE CHANGE 05/15/2016 Simonne Come, MD [facility]  . IR GENERIC HISTORICAL  06/28/2016   IR GJ TUBE CHANGE 06/28/2016 [facility]  . IR GJ TUBE CHANGE  02/20/2017  . IR GJ TUBE CHANGE  05/10/2017  . IR GJ TUBE CHANGE  07/06/2017  . IR GJ  TUBE CHANGE  08/02/2017  . IR GJ TUBE CHANGE  10/15/2017  . IR GJ TUBE CHANGE  12/26/2017  . IR GJ TUBE CHANGE  04/08/2018  . IR GJ TUBE CHANGE  06/19/2018  . IR GJ TUBE CHANGE  03/03/2019  . IR GJ TUBE CHANGE  06/06/2019  . IR GJ TUBE CHANGE  09/26/2019  . IR GJ TUBE CHANGE  01/09/2020  . IR GJ TUBE CHANGE  05/12/2020  . IR PATIENT EVAL TECH 0-60 MINS  10/19/2016  . IR PATIENT EVAL TECH 0-60 MINS  12/25/2016  . IR PATIENT EVAL TECH 0-60 MINS  01/29/2017  . IR PATIENT EVAL TECH 0-60 MINS  04/04/2017  . IR PATIENT EVAL TECH 0-60 MINS  04/30/2017  . IR PATIENT EVAL TECH 0-60 MINS  07/31/2017  . IR REPLC DUODEN/JEJUNO TUBE PERCUT W/FLUORO  11/14/2016  . LAPAROTOMY N/A 01/20/2015   Procedure: EXPLORATORY LAPAROTOMY;  Surgeon: Abigail Miyamoto, MD;  Location: [facility] OR;  Service: General;  Laterality: N/A;  . LOWER EXTREMITY ANGIOGRAPHY Left 09/21/2016   Procedure: Lower Extremity Angiography;  Surgeon: Maeola Harman, MD;  Location: [facility] INVASIVE CV LAB;  Service: Cardiovascular;  Laterality: Left;  . LOWER EXTREMITY ANGIOGRAPHY Right 06/27/2017   Procedure: Lower Extremity Angiography;  Surgeon: Maeola Harman, MD;  Location: [facility] INVASIVE CV LAB;  Service: Cardiovascular;  Laterality: Right;  . LYSIS OF ADHESION N/A 01/20/2015   Procedure: LYSIS OF ADHESIONS < 1 HOUR;  Surgeon: Abigail Miyamoto, MD;  Location: [facility] OR;  Service: General;  Laterality:  N/A;  . PERIPHERAL VASCULAR BALLOON ANGIOPLASTY Left 09/21/2016   Procedure: Peripheral Vascular Balloon Angioplasty;  Surgeon: Maeola Harman, MD;  Location: [facility] INVASIVE CV LAB;  Service: Cardiovascular;  Laterality: Left;  drug coated balloon  . PERIPHERAL VASCULAR BALLOON ANGIOPLASTY Right 06/27/2017   Procedure: PERIPHERAL VASCULAR BALLOON ANGIOPLASTY;  Surgeon: Maeola Harman, MD;  Location: [facility] INVASIVE CV LAB;  Service: Cardiovascular;  Laterality: Right;  SFA/[id number]  . TONSILLECTOMY    . TRANSMETATARSAL AMPUTATION Right 11/14/2019   Procedure: TRANSMETATARSAL AMPUTATION RIGHT FOOT;  Surgeon: Felecia Shelling, DPM;  Location: [facility] OR;  Service: Podiatry;  Laterality: Right;  . TUBAL LIGATION    . VENTRAL HERNIA REPAIR  2015   incarcerated ventral hernia ([facility] 09/2013)/notes 01/10/2015     OB History   No obstetric history on file.     Family History  Problem Relation Age of Onset  . Stroke Mother   . Hypertension Mother   . Diabetes Brother   . Glaucoma Son   . Glaucoma Son   . Heart attack Neg Hx   . Colon cancer Neg Hx   . Stomach cancer Neg Hx     Social History   Tobacco Use  . Smoking status: Never Smoker  . Smokeless tobacco: Former Neurosurgeon    Types: Snuff  . Tobacco comment: "used snuff in my younger days"  Vaping Use  . Vaping Use: Never used  Substance Use Topics  . Alcohol use: Never    Alcohol/week: 0.0 standard drinks  . Drug use: Never    Home Medications Prior to Admission medications   Medication Sig Start Date End Date Taking? Authorizing Provider  acetaminophen (TYLENOL) 500 MG tablet Take 500 mg by mouth every 6 (six) hours as needed.    [provider]  amLODipine (NORVASC) 5 MG tablet Take 1 tablet (5 mg total) by mouth daily. 10/20/19 10/19/20  Medina-Vargas, Monina C, NP  atorvastatin (LIPITOR) 10 MG tablet  TAKE 1 TABLET BY MOUTH EVERY DAY 06/24/20   Sharon Seller, NP  b complex vitamins tablet Take 1 tablet by mouth  daily.     [provider]  benzocaine (AMERICAINE) 20 % rectal ointment Place rectally every 3 (three) hours as needed for pain. 09/24/19   Fawze, Mina A, PA-C  calcium-vitamin D (OSCAL WITH D) 500-200 MG-UNIT per tablet Take 1 tablet by mouth daily with breakfast.     [provider]  Carboxymethylcellulose Sodium (ARTIFICIAL TEARS OP) Place 1 drop into both eyes 4 (four) times daily.    [provider]  cycloSPORINE (RESTASIS) 0.05 % ophthalmic emulsion USE 1 DROP INTO BOTH EYES TWICE DAILY 01/29/17   Kirt Boys, DO  doxycycline (VIBRA-TABS) 100 MG tablet Take 1 tablet (100 mg total) by mouth 2 (two) times daily. 08/27/20   Felecia Shelling, DPM  DULERA 200-5 MCG/ACT AERO TAKE TWO PUFFS BY MOUTH TWICE DAILY 03/24/20   Sharon Seller, NP  DULoxetine (CYMBALTA) 30 MG capsule TAKE 1 CAPSULE BY MOUTH EVERY DAY 05/05/20   Sharon Seller, NP  ferrous sulfate 220 (44 Fe) MG/5ML solution TAKE 7.4 MLS (325 MG TOTAL) BY MOUTH DAILY. 03/04/20   Sharon Seller, NP  fluticasone (FLONASE) 50 MCG/ACT nasal spray SPRAY 2 SPRAYS INTO EACH NOSTRIL EVERY DAY 06/25/19   Sharon Seller, NP  furosemide (LASIX) 40 MG tablet TAKE 0.5-1 TABLETS (20-40 MG TOTAL) BY MOUTH SEE ADMIN INSTRUCTIONS. TAKE 20 MG DAILY, MAY INSTEAD TAKE 40 MG DAILY AS NEEDED FOR EDEMA 08/04/20   Meriam Sprague, MD  gabapentin (NEURONTIN) 300 MG capsule Take 1 capsule (300 mg total) by mouth 2 (two) times daily. 05/17/20   Sharon Seller, NP  gentamicin cream (GARAMYCIN) 0.1 % Apply 1 application topically daily as needed (dry skin). 10/01/19   Felecia Shelling, DPM  hydrALAZINE (APRESOLINE) 50 MG tablet TAKE 1 TABLET BY MOUTH THREE TIMES A DAY 04/26/20   Lars Masson, MD  insulin aspart (NOVOLOG) 100 UNIT/ML FlexPen Inject into the skin as needed for high blood sugar. New sliding Scale If BS is 151 - 251 = 2 units,  If BS is 251 -  350 = 4 units,  If BS is 351 - 450 = 6 units Call provider if BS runs  more than 400    [provider]  Insulin Pen Needle (B-D UF III MINI PEN NEEDLES) 31G X 5 MM MISC Use twice daily with giving insulin injections. Dx E11.9 07/17/18   Sharon Seller, NP  ipratropium-albuterol (DUONEB) 0.5-2.5 (3) MG/3ML SOLN Take 3 mLs by nebulization every 6 (six) hours as needed (shortness of breath).    [provider]  isosorbide mononitrate (IMDUR) 30 MG 24 hr tablet TAKE 2 TABLETS BY MOUTH DAILY. PLEASE MAKE ANNUAL APPT WITH DR. Delton See FOR FUTURE REFILLS 09/15/19   Lars Masson, MD  LANTUS SOLOSTAR 100 UNIT/ML Solostar Pen INJECT 8 UNITS INTO THE SKIN AT BEDTIME. 05/10/20   Sharon Seller, NP  latanoprost (XALATAN) 0.005 % ophthalmic solution 1 drop at bedtime.    [provider]  levalbuterol Pauline Aus HFA) 45 MCG/ACT inhaler Inhale 2 puffs into the lungs every 6 (six) hours as needed for wheezing.    [provider]  levothyroxine (SYNTHROID) 125 MCG tablet TAKE 1 TABLET BY MOUTH EVERY DAY 06/24/20   Sharon Seller, NP  loratadine (CLARITIN) 10 MG tablet Take 10 mg by mouth daily as needed for allergies.  [provider]  Multiple Vitamin (MULTIVITAMIN WITH MINERALS) TABS tablet Take 1 tablet by mouth daily.    [provider]  Nutritional Supplements (FEEDING SUPPLEMENT, OSMOLITE 1.5 CAL,) LIQD Place 711 mLs into feeding tube See admin instructions. Take 711 ml over a 12 hour period per day    [provider]  nystatin (MYCOSTATIN/NYSTOP) powder Apply 1 application topically as needed (skin irritation). 09/11/19   Ngetich, Dinah C, NP  nystatin cream (MYCOSTATIN) APPLY TO AFFECTED AREA TWICE A DAY 05/10/20   Sharon Seller, NP  oxyCODONE (ROXICODONE) 5 MG immediate release tablet Take 1 tablet (5 mg total) by mouth every 4 (four) hours as needed for severe pain. May take 1/2 tab q4hrs as needed for mild pain. 08/11/19   Sharon Seller, NP  pantoprazole (PROTONIX) 40 MG tablet TAKE 1 TABLET BY  MOUTH EVERY DAY 09/24/19   Lars Masson, MD  polyethylene glycol (MIRALAX / GLYCOLAX) 17 g packet Take 17 g by mouth daily as needed for mild constipation.    [provider]  Probiotic Product (EQL PROBIOTIC COLON SUPPORT PO) Take 1 capsule by mouth daily.    [provider]  senna-docusate (SENOKOT-S) 8.6-50 MG tablet Take 1 tablet by mouth as needed for mild constipation.    [provider]  warfarin (COUMADIN) 2.5 MG tablet TAKE 2-3 TABLETS BY MOUTH DAILY AS DIRECTED BY COUMADIN CLINIC. 05/18/20   Lars Masson, MD  Water For Irrigation, Sterile (FREE WATER) SOLN Place 180 mLs into feeding tube 3 (three) times daily.     [provider]  White Petrolatum-Mineral Oil (SYSTANE NIGHTTIME) OINT Place 1 application into both eyes at bedtime.     [provider]    Allergies    Penicillins  Review of Systems   Review of Systems  Constitutional: Negative for fever.  Respiratory: Negative for shortness of breath.   Cardiovascular: Positive for leg swelling. Negative for chest pain.  Gastrointestinal: Negative for abdominal pain, nausea and vomiting.  Skin: Positive for color change and wound.  All other systems reviewed and are negative.   Physical Exam Updated Vital Signs BP (!) 145/49   Pulse 65   Temp 98.2 F (36.8 C) (Oral)   Resp 16   Ht 1.676 m (5\' 6" )   Wt 58.9 kg   SpO2 100%   BMI 20.96 kg/m   Physical Exam Vitals and nursing note reviewed.  Constitutional:      Appearance: She is well-developed.     Comments: Elderly, no acute distress  HENT:     Head: Normocephalic and atraumatic.     Mouth/Throat:     Mouth: Mucous membranes are moist.  Eyes:     Pupils: Pupils are equal, round, and reactive to light.  Cardiovascular:     Rate and Rhythm: Normal rate and regular rhythm.  Pulmonary:     Effort: Pulmonary effort is normal. No respiratory distress.  Abdominal:     Palpations: Abdomen is soft.   Musculoskeletal:     Cervical back: Neck supple.     Comments: Focused examination of the bilateral feet Right foot with transmetatarsal amputation no significant ulcerations or erythema Right foot with amputation of second digit, there is an ulcerative wound on the medial aspect at the MTP with clean base and no adjacent erythema Patient with left greater than right lower extremity swelling and slight warmth, no appreciable significant erythema, neurovascular intact distally  Skin:    General: Skin is warm  and dry.  Neurological:     Mental Status: She is alert and oriented to person, place, and time.     ED Results / Procedures / Treatments   Labs (all labs ordered are listed, but only abnormal results are displayed) Labs Reviewed  CBC WITH DIFFERENTIAL/PLATELET - Abnormal; Notable for the following components:      Result Value   RBC 2.96 (*)    Hemoglobin 9.5 (*)    HCT 28.4 (*)    Lymphs Abs 0.6 (*)    All other components within normal limits  BASIC METABOLIC PANEL - Abnormal; Notable for the following components:   Sodium 132 (*)    Glucose, Bld 104 (*)    BUN 52 (*)    Creatinine, Ser 1.46 (*)    GFR, Estimated 33 (*)    All other components within normal limits  CULTURE, BLOOD (ROUTINE X 2)  CULTURE, BLOOD (ROUTINE X 2)  RESP PANEL BY RT-PCR (FLU A&B, COVID) ARPGX2  LACTIC ACID, PLASMA    EKG None  Radiology DG Tibia/Fibula Left  Result Date: 08/27/2020 CLINICAL DATA:  Leg pain and cellulitis, initial encounter EXAM: LEFT TIBIA AND FIBULA - 2 VIEW COMPARISON:  None. FINDINGS: Soft tissue swelling is noted about the ankle. No acute fracture or dislocation is noted. Diffuse vascular calcifications and soft tissue calcifications are noted. IMPRESSION: Soft tissue swelling without acute bony abnormality. Electronically Signed   By: Alcide Clever M.D.   On: 08/27/2020 21:50   DG Foot Complete Left  Result Date: 08/27/2020 Please see detailed radiograph report in  office note.  DG Foot Complete Right  Result Date: 08/27/2020 Please see detailed radiograph report in office note.  VAS [place] LOWER EXTREMITY VENOUS (DVT) (ONLY [facility])  Result Date: 08/27/2020  Lower Venous DVT Study Patient Name:  LAVENIA BUJOLD  Date of Exam:   08/27/2020 Medical Rec #: [id number]      Accession #:    [id number] Date of Birth: 1923/12/12      Patient Gender: F Patient Age:   35Y Exam Location:  [facility] Procedure:      VAS [place] LOWER EXTREMITY VENOUS (DVT) Referring Phys: [id number] Shannon [name] --------------------------------------------------------------------------------  Indications: Edema.  Limitations: Restricted mobility, posterior acoustic shadowing secondary to arterial calcification. Comparison Study: No prior study Performing Technologist: Gertie Fey MHA, RDMS, RVT, RDCS  Examination Guidelines: A complete evaluation includes B-mode imaging, spectral Doppler, color Doppler, and power Doppler as needed of all accessible portions of each vessel. Bilateral testing is considered an integral part of a complete examination. Limited examinations for reoccurring indications may be performed as noted. The reflux portion of the exam is performed with the patient in reverse Trendelenburg.  Right Technical Findings: Not visualized segments include CFV.  +---------+---------------+---------+-----------+----------+--------------+ LEFT     CompressibilityPhasicitySpontaneityPropertiesThrombus Aging +---------+---------------+---------+-----------+----------+--------------+ CFV      Full           Yes      Yes                                 +---------+---------------+---------+-----------+----------+--------------+ SFJ      Full                                                        +---------+---------------+---------+-----------+----------+--------------+  FV Prox  Full                                                         +---------+---------------+---------+-----------+----------+--------------+ FV Mid   Full                                                        +---------+---------------+---------+-----------+----------+--------------+ FV DistalFull                                                        +---------+---------------+---------+-----------+----------+--------------+ PFV      Full                                                        +---------+---------------+---------+-----------+----------+--------------+ POP      Full           Yes      Yes                                 +---------+---------------+---------+-----------+----------+--------------+ PTV      Full                                                        +---------+---------------+---------+-----------+----------+--------------+ PERO     Full                                                        +---------+---------------+---------+-----------+----------+--------------+     Summary: LEFT: - There is no evidence of deep vein thrombosis in the lower extremity.  - No cystic structure found in the popliteal fossa.  *See table(s) above for measurements and observations.    Preliminary     Procedures Procedures   Medications Ordered in ED Medications  doxycycline (VIBRA-TABS) tablet 100 mg (has no administration in time range)  warfarin (COUMADIN) tablet 2.5 mg (has no administration in time range)  oxyCODONE-acetaminophen (PERCOCET/ROXICET) 5-325 MG per tablet 2 tablet (has no administration in time range)    ED Course  I have reviewed the triage vital signs and the nursing notes.  Pertinent labs & imaging results that were available during my care of the patient were reviewed by me and considered in my medical decision making (see chart for details).    MDM Rules/Calculators/A&P                          Patient presents with concern for  possible early cellulitis of the left lower extremity.  She  is overall nontoxic and vital signs are reassuring.  She is afebrile.  She denies any systemic symptoms although she is followed closely by podiatry and they had concern for early cellulitis.  Labs reviewed from triage.  Lab work is mostly at baseline.  No leukocytosis or significant metabolic derangement.  Lactate is normal.  She has no signs or symptoms of sepsis.  X-ray sent from triage of the left lower extremity with just soft tissue swelling.  She also had a duplex that was ordered that ruled out DVT.  Overall I feel her work-up is largely reassuring.  No evidence of osteomyelitis, DVT, or sepsis related to cellulitis.  She has not started her outpatient antibiotics and is technically not yet a failure of outpatient antibiotics.  I discussed this with the patient and her daughter.  Feel it reasonable for her to start doxycycline and monitor for improvement.  If she were to develop systemic symptoms such as fever or worsening swelling and redness, she would need to be reevaluated.  She was given her first dose of doxycycline here in addition to pain medication and her daily dose of Coumadin.  Unfortunately, she has been in the ER for greater than 12 hours.  An INR was not checked on initial lab work.  I do not feel strongly that this needs to be checked today as there is no evidence of active bleeding and her hemoglobin is stable.  Do recommend checking with her primary physician.  After history, exam, and medical workup I feel the patient has been appropriately medically screened and is safe for discharge home. Pertinent diagnoses were discussed with the patient. Patient was given return precautions.  Final Clinical Impression(s) / ED Diagnoses Final diagnoses:  Cellulitis of left lower extremity    Rx / DC Orders ED Discharge Orders    None       Shon Baton, MD 08/28/20 [phone]

## 2020-08-31 ENCOUNTER — Other Ambulatory Visit: Payer: Self-pay

## 2020-08-31 ENCOUNTER — Ambulatory Visit (INDEPENDENT_AMBULATORY_CARE_PROVIDER_SITE_OTHER): Payer: Medicare Other

## 2020-08-31 DIAGNOSIS — I4892 Unspecified atrial flutter: Secondary | ICD-10-CM | POA: Diagnosis not present

## 2020-08-31 DIAGNOSIS — I4891 Unspecified atrial fibrillation: Secondary | ICD-10-CM | POA: Diagnosis not present

## 2020-08-31 DIAGNOSIS — Z5181 Encounter for therapeutic drug level monitoring: Secondary | ICD-10-CM | POA: Diagnosis not present

## 2020-08-31 LAB — POCT INR: INR: 4.8 — AB (ref 2.0–3.0)

## 2020-08-31 NOTE — Patient Instructions (Signed)
-   skip warfarin tonight and tomorrow, then  - take 2 tablets a day while she is on doxycycline. - Recheck INR next week.  [facility] [phone].

## 2020-09-01 ENCOUNTER — Telehealth: Payer: Self-pay | Admitting: Cardiology

## 2020-09-01 LAB — CULTURE, BLOOD (ROUTINE X 2)
Culture: NO GROWTH
Special Requests: ADEQUATE

## 2020-09-01 LAB — WOUND CULTURE

## 2020-09-01 NOTE — Telephone Encounter (Signed)
Patient's daughter called to say that she is expecting a call back and she hasnt gotten a call back from previous conversation. Please advise

## 2020-09-01 NOTE — Telephone Encounter (Signed)
    Pt c/o swelling: STAT is pt has developed SOB within 24 hours  1) How much weight have you gained and in what time span?   2) If swelling, where is the swelling located? Left leg  3) Are you currently taking a fluid pill? Yes  4) Are you currently SOB? No  5) Do you have a log of your daily weights (if so, list)? No   6) Have you gained 3 pounds in a day or 5 pounds in a week? No   7) Have you traveled recently? No    Pt's daughter calling, she said pt's left leg is still swollen, pt will see foot doctor but they are concern about the swelling since pt has CHF, she said pt been taking fluid pill but not helping the swelling very much. Pt of Dr. Meda Coffee and not yet seen by Dr. Johney Frame but daughter already know Dr. Johney Frame will be pt's cards

## 2020-09-01 NOTE — Telephone Encounter (Signed)
Spoke with patient's daughter and she reports her mother's left leg is swollen and has had swelling for about a week.  Patient complains of pain in leg, had daughter check for pitting and she said it doesn't leave indentation but patient said it hurt.  Patient went to the ED and note reads being treated for cellulitis and started doxycycline.  Daughter concerned about swelling in left leg and has given 40 mg lasix on Saturday and Monday.  I think the swelling is from Cellulits and not fluid. Please advise.

## 2020-09-01 NOTE — Telephone Encounter (Signed)
Spoke to patient and let her know the note from the emergency room stated she was being treated for cellulitis in the left leg and was started on doxycycline.  Asked daughter to give 20 mg of lasix as ordered daily and if something changes will notify her.  Daughter appreciative of call back.

## 2020-09-01 NOTE — Telephone Encounter (Signed)
Left message to call office

## 2020-09-02 ENCOUNTER — Other Ambulatory Visit: Payer: Self-pay | Admitting: *Deleted

## 2020-09-02 NOTE — Patient Outreach (Signed)
[facility]) Care Management  09/02/2020  Shannon [name] Dec 14, 1923 [id number]   Outgoing call placed to member, no answer, unable to leave voice message, mailbox full. Will follow up within the next 3-4 business days.  [name], [place], MSN [employer] [phone]

## 2020-09-06 ENCOUNTER — Telehealth: Payer: Self-pay | Admitting: *Deleted

## 2020-09-06 ENCOUNTER — Telehealth: Payer: Self-pay | Admitting: Cardiology

## 2020-09-06 MED ORDER — NYSTATIN 100000 UNIT/ML MT SUSP: 5.0000 mL | Freq: Four times a day (QID) | OROMUCOSAL | 0 refills | Status: DC

## 2020-09-06 NOTE — Telephone Encounter (Signed)
If she was unable to get in with her PCP, happy to add her on later this week or with the DOD. May want to scan the left leg for a DVT if left is bigger than right unless the left is always bigger.

## 2020-09-06 NOTE — Telephone Encounter (Signed)
Nystatin mouthwash sent to pharmacy, to use 5 cc swish and spit 4 times daily for 1 week. If symptoms worsen or fail to improve to make appt in office to be seen

## 2020-09-06 NOTE — Telephone Encounter (Signed)
Patient caregiver notified LMOM with Jessica's response.

## 2020-09-06 NOTE — Telephone Encounter (Signed)
Spoke with the pts daughter this morning and she reports that the pt has been having some increased edema in her left leg where she has a h/o cellultis.Shannon [name] no pain, no changes in color from its baseline... she has not had an increase in her weight and her breathing has not worsened and she appears comfortable according to her daughter. She is having trouble at night laying flat... she develops a cough and she spits up thick sputum that is yellow in color, NO fever, no pain with inspiration.   I offered her an appt wih Dr. Johney Frame this morning at 11 am but she reports this may be too early to bring her to the office.. she also has a call in to her PCP, Sherrie Mustache NP to see if she can get an appt with her later in the day or tomorrow.   I will forward to Dr. Merryl Hacker for review and further recommendations.

## 2020-09-06 NOTE — Telephone Encounter (Signed)
Pt c/o swelling: STAT is pt has developed SOB within 24 hours  1) How much weight have you gained and in what time span? Does not appear that mom has gained weight per daugher  2) If swelling, where is the swelling located? Left leg swelling with no opain  3) Are you currently taking a fluid pill? Yes  4) Are you currently SOB? NO  5) Do you have a log of your daily weights (if so, list)? NO  6) Have you gained 3 pounds in a day or 5 pounds in a week? No weight gain noted at this time per daughter [name]  7) Have you traveled recently? No

## 2020-09-06 NOTE — Telephone Encounter (Signed)
[name], caregiver, called and stated that patient has Thrush. Stated that patient has been on Doxycycline since last Saturday.  Has white patches in mouth. Noticed it yesterday.   No available appointments Please Advise.

## 2020-09-07 ENCOUNTER — Other Ambulatory Visit: Payer: Self-pay | Admitting: *Deleted

## 2020-09-07 NOTE — Patient Outreach (Signed)
[facility]) Care Management  [facility] Care Manager  09/07/2020   Shannon [name] 02/05/1924 [id number]   Outreach attempt #2, successful.  State she is still having a few issues with her leg/foot, will see podiatrist.  Denies any urgent concerns, encouraged to contact this care manager with questions.     Encounter Medications:  Outpatient Encounter Medications as of 09/07/2020  Medication Sig  . acetaminophen (TYLENOL) 500 MG tablet Take 500 mg by mouth every 6 (six) hours as needed.  [name] amLODipine (NORVASC) 5 MG tablet Take 1 tablet (5 mg total) by mouth daily.  [name] atorvastatin (LIPITOR) 10 MG tablet TAKE 1 TABLET BY MOUTH EVERY DAY  . b complex vitamins tablet Take 1 tablet by mouth daily.   . benzocaine (AMERICAINE) 20 % rectal ointment Place rectally every 3 (three) hours as needed for pain.  . calcium-vitamin D (OSCAL WITH D) 500-200 MG-UNIT per tablet Take 1 tablet by mouth daily with breakfast.   . Carboxymethylcellulose Sodium (ARTIFICIAL TEARS OP) Place 1 drop into both eyes 4 (four) times daily.  . cycloSPORINE (RESTASIS) 0.05 % ophthalmic emulsion USE 1 DROP INTO BOTH EYES TWICE DAILY  . doxycycline (VIBRA-TABS) 100 MG tablet Take 1 tablet (100 mg total) by mouth 2 (two) times daily.  . DULERA 200-5 MCG/ACT AERO TAKE TWO PUFFS BY MOUTH TWICE DAILY  . DULoxetine (CYMBALTA) 30 MG capsule TAKE 1 CAPSULE BY MOUTH EVERY DAY  . ferrous sulfate 220 (44 Fe) MG/5ML solution TAKE 7.4 MLS (325 MG TOTAL) BY MOUTH DAILY.  . fluticasone (FLONASE) 50 MCG/ACT nasal spray SPRAY 2 SPRAYS INTO EACH NOSTRIL EVERY DAY  . furosemide (LASIX) 40 MG tablet TAKE 0.5-1 TABLETS (20-40 MG TOTAL) BY MOUTH SEE ADMIN INSTRUCTIONS. TAKE 20 MG DAILY, MAY INSTEAD TAKE 40 MG DAILY AS NEEDED FOR EDEMA  . gabapentin (NEURONTIN) 300 MG capsule Take 1 capsule (300 mg total) by mouth 2 (two) times daily.  [name] gentamicin cream (GARAMYCIN) 0.1 % Apply 1 application topically daily as needed (dry skin).  .  hydrALAZINE (APRESOLINE) 50 MG tablet TAKE 1 TABLET BY MOUTH THREE TIMES A DAY  . insulin aspart (NOVOLOG) 100 UNIT/ML FlexPen Inject into the skin as needed for high blood sugar. New sliding Scale If BS is 151 - 251 = 2 units,  If BS is 251 -  350 = 4 units,  If BS is 351 - 450 = 6 units Call provider if BS runs more than 400  . Insulin Pen Needle (B-D UF III MINI PEN NEEDLES) 31G X 5 MM MISC Use twice daily with giving insulin injections. Dx E11.9  . ipratropium-albuterol (DUONEB) 0.5-2.5 (3) MG/3ML SOLN Take 3 mLs by nebulization every 6 (six) hours as needed (shortness of breath).  . isosorbide mononitrate (IMDUR) 30 MG 24 hr tablet TAKE 2 TABLETS BY MOUTH DAILY. PLEASE MAKE ANNUAL APPT WITH DR. Delton See FOR FUTURE REFILLS  . LANTUS SOLOSTAR 100 UNIT/ML Solostar Pen INJECT 8 UNITS INTO THE SKIN AT BEDTIME.  [name] latanoprost (XALATAN) 0.005 % ophthalmic solution 1 drop at bedtime.  . levalbuterol (XOPENEX HFA) 45 MCG/ACT inhaler Inhale 2 puffs into the lungs every 6 (six) hours as needed for wheezing.  [name] levothyroxine (SYNTHROID) 125 MCG tablet TAKE 1 TABLET BY MOUTH EVERY DAY  . loratadine (CLARITIN) 10 MG tablet Take 10 mg by mouth daily as needed for allergies.  . Multiple Vitamin (MULTIVITAMIN WITH MINERALS) TABS tablet Take 1 tablet by mouth daily.  . Nutritional Supplements (FEEDING SUPPLEMENT, OSMOLITE  1.5 CAL,) LIQD Place 711 mLs into feeding tube See admin instructions. Take 711 ml over a 12 hour period per day  . nystatin (MYCOSTATIN) 100000 UNIT/ML suspension Take 5 mLs (500,000 Units total) by mouth 4 (four) times daily.  [name] nystatin (MYCOSTATIN/NYSTOP) powder Apply 1 application topically as needed (skin irritation).  . nystatin cream (MYCOSTATIN) APPLY TO AFFECTED AREA TWICE A DAY  . oxyCODONE (ROXICODONE) 5 MG immediate release tablet Take 1 tablet (5 mg total) by mouth every 4 (four) hours as needed for severe pain. May take 1/2 tab q4hrs as needed for mild pain.  . pantoprazole  (PROTONIX) 40 MG tablet TAKE 1 TABLET BY MOUTH EVERY DAY  . polyethylene glycol (MIRALAX / GLYCOLAX) 17 g packet Take 17 g by mouth daily as needed for mild constipation.  . Probiotic Product (EQL PROBIOTIC COLON SUPPORT PO) Take 1 capsule by mouth daily.  [name] senna-docusate (SENOKOT-S) 8.6-50 MG tablet Take 1 tablet by mouth as needed for mild constipation.  [name] warfarin (COUMADIN) 2.5 MG tablet TAKE 2-3 TABLETS BY MOUTH DAILY AS DIRECTED BY COUMADIN CLINIC.  [name] Water For Irrigation, Sterile (FREE WATER) SOLN Place 180 mLs into feeding tube 3 (three) times daily.   Cliffton Asters Petrolatum-Mineral Oil (SYSTANE NIGHTTIME) OINT Place 1 application into both eyes at bedtime.    No facility-administered encounter medications on file as of 09/07/2020.    Functional Status:  In your present state of health, do you have any difficulty performing the following activities: 04/29/2020 04/22/2020  Hearing? Y Y  Comment wears 2 hearing aides wears 2 hearing aides  Vision? N N  Difficulty concentrating or making decisions? N N  Walking or climbing stairs? Y Y  Dressing or bathing? N N  Doing errands, shopping? Y Y  Some recent data might be hidden    Fall/Depression Screening: Fall Risk  07/05/2020 11/07/2019 10/21/2019  Falls in the past year? 0 0 0  Number falls in past yr: 0 0 0  Injury with Fall? 0 0 0  Risk for fall due to : - - -  Follow up - - -   PHQ 2/9 Scores 10/21/2019 07/21/2019 10/10/2018 08/07/2018 10/05/2017 02/28/2017 10/25/2016  PHQ - 2 Score 0 0 0 0 0 0 -  Exception Documentation - - - - - - Other- indicate reason in comment box  Not completed - - - - - - call completed with dtr    Assessment:   Care Plan Care Plan : General Plan of Care (Adult)  Updates made by Kemper Durie, RN since 09/07/2020 12:00 AM    Problem: Quality of Life (General Plan of Care)   Note:   Wound healing    Long-Range Goal: Quality of Life Maintained evidenced by patient's ability to perform all ADL's independently  within the next 3 months   Start Date: 06/08/2020  Expected End Date: 12/06/2020  This Visit's Progress: Not on track  Recent Progress: On track  Priority: Medium  Note:   Promoted activities to decrease social isolation such as group support or social, leisure and recreational activities, employment, use of social media; consider safety concerns about being out of home for activities.   Determined level of modifiable health risk.  4/6 - Assessed and monitored for signs/symptoms of psychosocial concerns, especially depression or ideations regarding harm to others or self; provide or refer for mental health services as needed.   5/4 - Assessed patient's thoughts about quality of life, goals and expectations, and dissatisfaction or desire  to improve.   Identified issues of primary importance such as mental health, illness, exercise tolerance, pain, sexual function and intimacy, cognitive change, social isolation, finances and relationships.    6/7 - Goal extended, still having issues with leg/foot, needing help from daughter   Task: Support and Maintain Acceptable Degree of Health, Comfort and Happiness   Due Date: 12/06/2020  Note:   Care Management Activities:    - affirmation provided - social relationships promoted - wellness behaviors promoted    Notes:      Goals Addressed            This Visit's Progress   . Remove Barriers to Better Nutrition   On track    Timeframe:  Long-Range Goal Priority:  Medium Start Date:          5/4            Expected End Date:      7/4  Barriers: Other Nutrition - Feeding tube     - make food easier to swallow by adding gravy or drinking water between bites - take my supplements as prescribed    Why is this important?    Eating healthy is not always easy.   You may not feel hungry or food might not taste good.   It may be a lot of work to shop or make meals.   There are things you can do to make it easier.    Notes:   2/7 -  feeding tube to be replaced on 2/9  3/8 - Confirmed tube is replaced, no leaking  4/6 - New issues with tube, awaiting test results for possible infection  5/4 - Confirmed no infection, having intermittent constipation.  Uses an herbal tea as well as Sennekot and Miralax for relief  6/7 - no longer having issues with feeding tube but now has thrush due to antibiotic use.  Daughter called office and has order for nystatin sent to pharmacy.    Brandon Melnick - Make and Keep All Appointments   On track      Timeframe:  Short-Term Goal Priority:  Medium Start Date:       5/4           Expected End Date:     6/4  Barriers: Knowledge                  - ask family or friend for a ride - call to cancel if needed - keep a calendar with appointment dates    Why is this important?   Part of staying healthy is seeing the doctor for follow-up care.  If you forget your appointments, there are some things you can do to stay on track.    Notes:   11/22 - Upcoming appointments reviewed with member  12/21 - reminded of follow up with podiatrist  2/7 - Reviewed upcoming appointments with member, PCP on 3/9, podiatrist on 3/16  4/6 - Encouraged to follow up with GI regarding feeding tube and test results  5/4 - Continues to have issues with feet, particularly left foot bunion.  Appointment with podiatrist on 5/18, will continue to use cream and contact provider if in need of sooner visit  6/7 - follow up with podiatrist tomorrow and 6/22.  Still has hardened area on right foot and cellulitis on left leg/foot.  Appointment with PCP on 7/11    . [name] - Perform Foot Care       Barriers: Knowledge  Timeframe:  Long-Range Goal Priority:  High Start Date:       6/7              Expected End Date:    9/7          - check feet daily for cuts, sores or redness - wash and dry feet carefully every day - wear comfortable, cotton socks - wear comfortable, well-fitting shoes    Why is this  important?   Good foot care is very important when you have diabetes.  There are many things you can do to keep your feet healthy and catch a problem early.    Notes:   11/22 - Reviewed updated wound care per most recent MD visit  12/21 - Discussed ongoing wound care with member  2/7 - Reminded of wound care per podiatrist, encouraged to keep wound clean and dry  6/7 - Seen in ED for cellulitis of left foot/leg, completed course of antibiotics, still has swelling.         Plan:  Follow-up: Patient agrees to Care Plan and Follow-up. Follow-up in 1 month(s)   Kemper Durie, RN, MSN [facility] Care Management  [facility] Manager [phone]

## 2020-09-07 NOTE — Telephone Encounter (Signed)
Spoke with the pts daughter.  She states pt will go into see her other Provider tomorrow and if they advise that our office needs to do further work-up for the pt, she will call and let us know. Pt will see  them tomorrow for follow-up of cellulitis.  Advised the pts daughter to keep [place] posted as needed. She states her PCP is aware of everything and pt has no pain in that leg or cardiac complaints at this time.  Daughter was very appreciated for all the assistance provided.

## 2020-09-08 ENCOUNTER — Other Ambulatory Visit: Payer: Self-pay

## 2020-09-08 ENCOUNTER — Ambulatory Visit (INDEPENDENT_AMBULATORY_CARE_PROVIDER_SITE_OTHER): Payer: Medicare Other | Admitting: Podiatry

## 2020-09-08 DIAGNOSIS — L97502 Non-pressure chronic ulcer of other part of unspecified foot with fat layer exposed: Secondary | ICD-10-CM

## 2020-09-08 DIAGNOSIS — L97522 Non-pressure chronic ulcer of other part of left foot with fat layer exposed: Secondary | ICD-10-CM | POA: Diagnosis not present

## 2020-09-08 DIAGNOSIS — L97512 Non-pressure chronic ulcer of other part of right foot with fat layer exposed: Secondary | ICD-10-CM | POA: Diagnosis not present

## 2020-09-08 DIAGNOSIS — I739 Peripheral vascular disease, unspecified: Secondary | ICD-10-CM

## 2020-09-08 IMAGING — XA IR REPLACE G/J TUBE W/ FLUORO
1 series · 2 of 2 positions shown · non-contrast
Comparison: none

INDICATION: [AGE] female with a percutaneous gastrojejunostomy tube. The
external portion has a hole in it. Patient presents for exchange for
a new device.

[Series 300: tube placements · 2 of 2 slices shown]
[im 1/2]
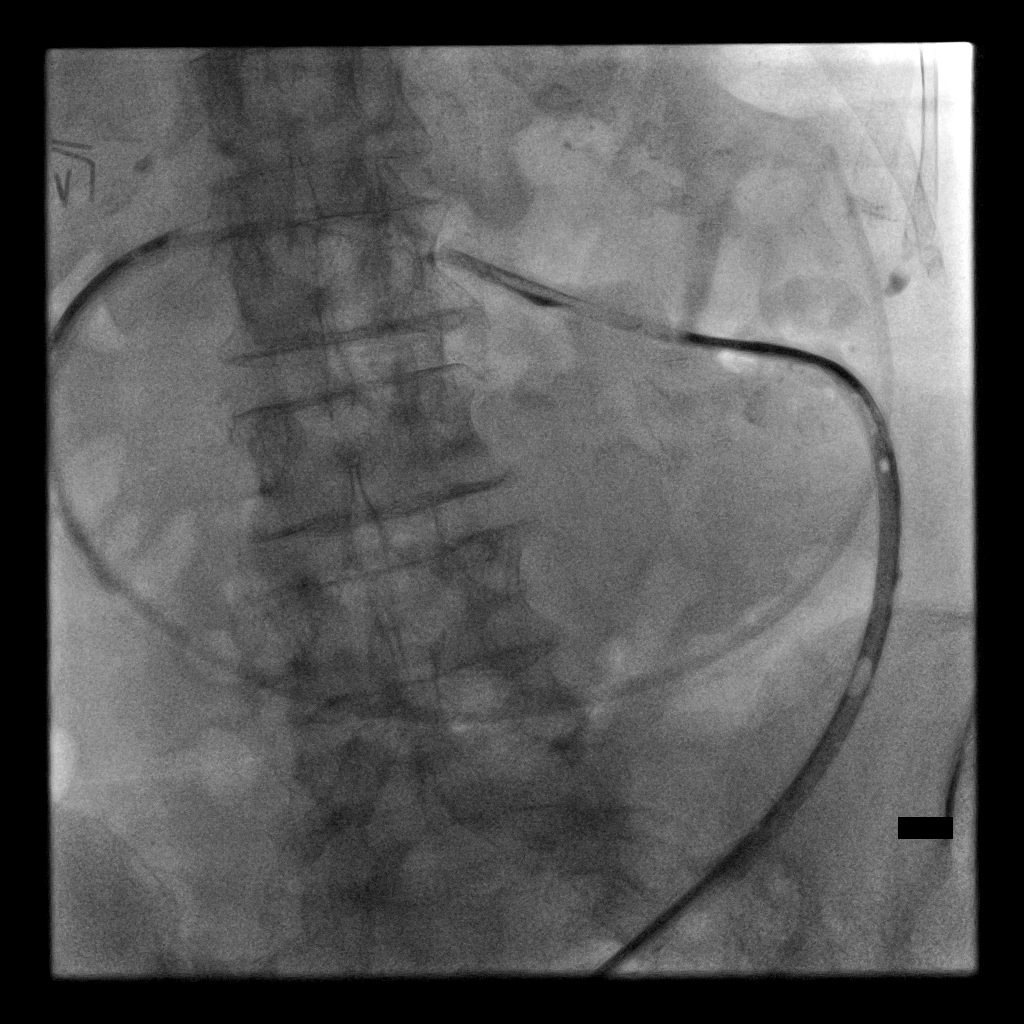
[im 2/2]
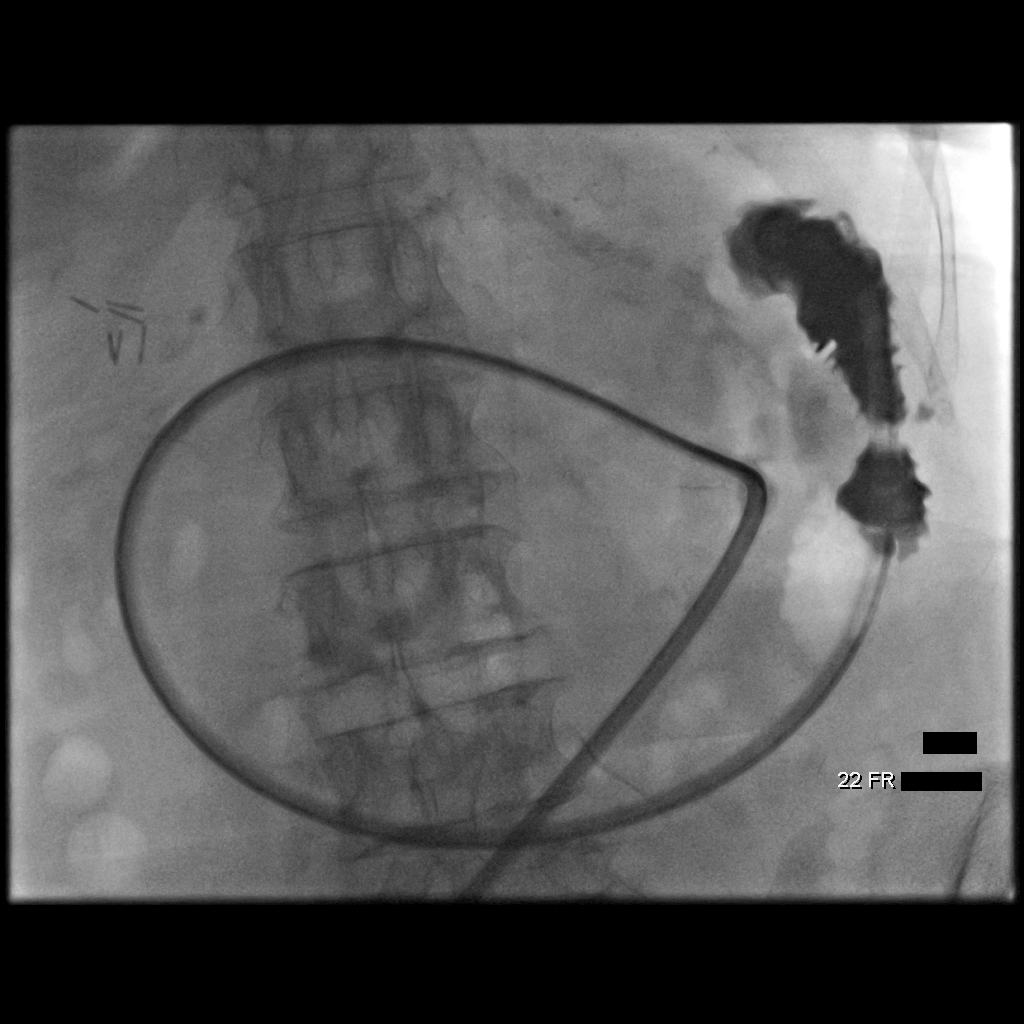

[2 of 2 positions shown; findings below may reference images not displayed]

EXAM:
JEJUNAL CATHETER REPLACEMENT

MEDICATIONS:
None

ANESTHESIA/SEDATION:
None

CONTRAST:  15 mL Ssovue-VSS-administered into the jejunal lumen.

FLUOROSCOPY TIME:  Fluoroscopy Time: 4 minutes 18 seconds (30 mGy).

COMPLICATIONS:
None immediate.

PROCEDURE:
Informed written consent was obtained from the patient after a
thorough discussion of the procedural risks, benefits and
alternatives. All questions were addressed. Maximal Sterile Barrier
Technique was utilized including caps, mask, sterile gowns, sterile
gloves, sterile drape, hand hygiene and skin antiseptic. A timeout
was performed prior to the initiation of the procedure.

A hand injection of contrast material was performed through the
existing tube. The distal tube is clogged. The injected contrast
material spills out the hole in the external portion of the tube.

Therefore, a wire was advanced through the tube and into the
proximal jejunum. This required multiple wires as well as a
supporting 4 French glide catheter. Ultimately, a stiff glidewire
was forced through the sidewall of the distal tube in the jejunum.
The retention balloon was then deflated and the existing gastro
jejunostomy tube was removed over the wire. A new 22 French GJ tube
was then successfully navigated over the wire, through the stomach
and into the proximal jejunum. The retention balloon was filled with
8 mL water and pulled snug against the anterior abdominal wall. The
external bumper was fixed in place.
IMPRESSION: Successful exchange for a new 22 French gastrojejunostomy tube.

## 2020-09-08 MED ORDER — DOXYCYCLINE HYCLATE 100 MG PO TABS: 100.0000 mg | ORAL_TABLET | Freq: Two times a day (BID) | ORAL | 0 refills | Status: DC

## 2020-09-10 ENCOUNTER — Other Ambulatory Visit: Payer: Self-pay

## 2020-09-10 ENCOUNTER — Ambulatory Visit (INDEPENDENT_AMBULATORY_CARE_PROVIDER_SITE_OTHER): Payer: Medicare Other

## 2020-09-10 ENCOUNTER — Ambulatory Visit (HOSPITAL_COMMUNITY)
Admission: RE | Admit: 2020-09-10 | Discharge: 2020-09-10 | Disposition: A | Discharge status: Home / Self Care | Payer: Medicare Other | Source: Ambulatory Visit | Attending: Podiatry | Admitting: Podiatry

## 2020-09-10 DIAGNOSIS — I4891 Unspecified atrial fibrillation: Secondary | ICD-10-CM | POA: Diagnosis not present

## 2020-09-10 DIAGNOSIS — Z5181 Encounter for therapeutic drug level monitoring: Secondary | ICD-10-CM | POA: Diagnosis not present

## 2020-09-10 DIAGNOSIS — L97522 Non-pressure chronic ulcer of other part of left foot with fat layer exposed: Secondary | ICD-10-CM

## 2020-09-10 DIAGNOSIS — I4892 Unspecified atrial flutter: Secondary | ICD-10-CM | POA: Diagnosis not present

## 2020-09-10 LAB — POCT INR: INR: 4 — AB (ref 2.0–3.0)

## 2020-09-10 NOTE — Patient Instructions (Signed)
Description   Skip today's dosage of Warfarin, then start taking 2 tablets daily except 1 tablet on Tuesdays and Saturdays, while on Doxycycline.  Recheck in 1 week.   Previous dosage regimen was 2 tablets daily except 3 tablets on Sundays and Thursdays  Coumadin Clinic [phone].

## 2020-09-14 NOTE — Progress Notes (Signed)
Subjective:  Patient presents today status post transmetatarsal amputation of the right foot. DOS: 11/14/2019.  Patient continues to have some slight drainage to the wounds and tenderness to the foot and the leg.  Overall she says there is some improvement.  She presents for further treatment and evaluation  Past Medical History:  Diagnosis Date   Acute respiratory failure ([place])    Anemia    previous blood transfusions   Arthritis    "all over"   Asthma    Atrial flutter (HCC)    Bradycardia    requiring previous d/c of BB and reduction of amiodarone   CAD (coronary artery disease)    nonobstructive per notes   Chronic diastolic CHF (congestive heart failure) ([place])    CKD (chronic kidney disease), stage III ([place])    Complication of blood transfusion    "got the wrong blood type at [facility] in ~ 2015; no adverse reaction that we are aware of"/daughter, [name] (01/27/2016)   COPD (chronic obstructive pulmonary disease) ([place])    Depression    "light case"   DVT (deep venous thrombosis) ([place]) 01/2016   a. LLE DVT 01/2016 - switched from Eliquis to Coumadin.   Dyspnea    with some activity   Gastric stenosis    a. s/p stomach tube   GERD (gastroesophageal reflux disease)    Headache    History of 2019 novel coronavirus disease (COVID-19)    History of blood transfusion    "several" (01/27/2016)   History of stomach ulcers    Hyperlipidemia    Hypertension    Hypothyroidism    On home oxygen therapy    "2L; 9PM til 9AM" (06/27/2017)   PAD (peripheral artery disease) (HCC)    a. prior gangrene, toe amputations, intervention.   PAF (paroxysmal atrial fibrillation) ([place])    Paraesophageal hernia    Perforated gastric ulcer ([place])    Pneumonia    "a few times" (06/27/2017)   Seasonal allergies    SIADH (syndrome of inappropriate ADH production) ([place])    [name] 01/10/2015   Small bowel obstruction ([place])    "I don't know how many" (01/11/2015)   Stroke ([place])    several- no  residual   Type II diabetes mellitus ([place])    "related to prednisone use  for > 20 yr; once predinose stopped; no more DM RX" (01/27/2016)   UTI (urinary tract infection) 02/08/2016   Ventral hernia with bowel obstruction     Objective: Physical Exam General: The patient is alert and oriented x3 in no acute distress.  Dermatology: Ulcer noted to the medial aspect of the first MTPJ left.  Drainage noted.  Vascular: Improved edema and erythema noted to the left foot ankle and leg.  There is also some pain with calf compression.  Skin is warm to touch compared to the contralateral limb.  Neurological: Epicritic and protective threshold diminished bilaterally.   Musculoskeletal Exam: Status post TMA right foot.  Hallux valgus left.  Second and third toe amputation left.  Radiographic exam bilateral feet 08/18/2020: Transmetatarsal amputation noted right foot.  Second and third toe amputations left.  No significant osseous erosions.  Left foot demonstrates history of left second and third toe amputation with resection of the metatarsal heads as well and hallux valgus deformity.  Diffuse degenerative changes noted.  No gas within the tissue.  No cortical irregularity or osseous erosion noted.  Assessment: 1. s/p transmetatarsal amputation right foot. DOS: 11/14/2019 2.  Ulcer right  lateral forefoot amputation stump 3.  Hallux valgus left. H/o 2nd toe amputation left  Plan of Care:  1. Patient was evaluated.  2.  Recommend Betadine and dry sterile dressings of the left foot 3.  Continue diabetic shoes with metatarsal pads to offload pressure from the right forefoot amputation stump 4.  Today we are going to order ABIs left lower extremity to help rule out any change in her peripheral vascular disease that would contribute to her left leg pain and swelling 5.  Refill prescription for doxycycline 100 mg 2 times daily #20 6.  Return to clinic in 2 weeks  Edrick Kins, DPM [facility]  Dr. Edrick Kins, DPM    [address], [place] [address]                Office [phone]  Fax [phone]

## 2020-09-17 ENCOUNTER — Other Ambulatory Visit: Payer: Self-pay

## 2020-09-17 ENCOUNTER — Ambulatory Visit (INDEPENDENT_AMBULATORY_CARE_PROVIDER_SITE_OTHER): Payer: Medicare Other | Admitting: Pharmacist

## 2020-09-17 DIAGNOSIS — I4891 Unspecified atrial fibrillation: Secondary | ICD-10-CM | POA: Diagnosis not present

## 2020-09-17 DIAGNOSIS — Z5181 Encounter for therapeutic drug level monitoring: Secondary | ICD-10-CM | POA: Diagnosis not present

## 2020-09-17 DIAGNOSIS — I4892 Unspecified atrial flutter: Secondary | ICD-10-CM

## 2020-09-17 LAB — POCT INR: INR: 2.9 (ref 2.0–3.0)

## 2020-09-17 NOTE — Patient Instructions (Addendum)
Description   Take 2 tablets today and 1 tablet on Saturday since doxycycline course ends on Sunday. Starting Sunday, resume normal dose of 2 tablets daily except 3 tablets on Sundays and Thursdays. Recheck INR in 2-3 weeks. [facility] [phone].

## 2020-09-20 ENCOUNTER — Telehealth: Payer: Self-pay

## 2020-09-20 ENCOUNTER — Other Ambulatory Visit: Payer: Self-pay | Admitting: *Deleted

## 2020-09-20 MED ORDER — PANTOPRAZOLE SODIUM 40 MG PO TBEC: 1.0000 | DELAYED_RELEASE_TABLET | Freq: Every day | ORAL | 2 refills | Status: DC

## 2020-09-20 MED ORDER — ISOSORBIDE MONONITRATE ER 30 MG PO TB24: ORAL_TABLET | ORAL | 2 refills | Status: DC

## 2020-09-20 NOTE — Telephone Encounter (Signed)
The pharmacy has sent over a fax stating the insurance is no longer covering Protonix (refill sent over this morning). Is there something Dr Johney Frame would like to change the medication to or should I send the request to the PCP?

## 2020-09-20 NOTE — Telephone Encounter (Signed)
Yes, please refill

## 2020-09-20 NOTE — Telephone Encounter (Signed)
Please refer this to pts PCP for further assistance.  Dr. Johney Frame is out of the office this week. Thank you!

## 2020-09-22 ENCOUNTER — Ambulatory Visit (INDEPENDENT_AMBULATORY_CARE_PROVIDER_SITE_OTHER): Payer: Medicare Other

## 2020-09-22 ENCOUNTER — Ambulatory Visit (INDEPENDENT_AMBULATORY_CARE_PROVIDER_SITE_OTHER): Payer: Medicare Other | Admitting: Podiatry

## 2020-09-22 ENCOUNTER — Other Ambulatory Visit: Payer: Self-pay

## 2020-09-22 DIAGNOSIS — L97512 Non-pressure chronic ulcer of other part of right foot with fat layer exposed: Secondary | ICD-10-CM

## 2020-09-22 DIAGNOSIS — E0859 Diabetes mellitus due to underlying condition with other circulatory complications: Secondary | ICD-10-CM | POA: Diagnosis not present

## 2020-09-22 DIAGNOSIS — I739 Peripheral vascular disease, unspecified: Secondary | ICD-10-CM | POA: Diagnosis not present

## 2020-09-22 DIAGNOSIS — L97522 Non-pressure chronic ulcer of other part of left foot with fat layer exposed: Secondary | ICD-10-CM

## 2020-09-22 NOTE — Progress Notes (Signed)
Subjective:  Patient presents today status post transmetatarsal amputation of the right foot. DOS: 11/14/2019.  Patient continues to have some slight drainage to the wounds and tenderness to the foot and the leg.  Overall she says there is some improvement.  She presents for further treatment and evaluation  Past Medical History:  Diagnosis Date   Acute respiratory failure ([place])    Anemia    previous blood transfusions   Arthritis    "all over"   Asthma    Atrial flutter (HCC)    Bradycardia    requiring previous d/c of BB and reduction of amiodarone   CAD (coronary artery disease)    nonobstructive per notes   Chronic diastolic CHF (congestive heart failure) ([place])    CKD (chronic kidney disease), stage III ([place])    Complication of blood transfusion    "got the wrong blood type at [facility] in ~ 2015; no adverse reaction that we are aware of"/daughter, [name] (01/27/2016)   COPD (chronic obstructive pulmonary disease) ([place])    Depression    "light case"   DVT (deep venous thrombosis) ([place]) 01/2016   a. LLE DVT 01/2016 - switched from Eliquis to Coumadin.   Dyspnea    with some activity   Gastric stenosis    a. s/p stomach tube   GERD (gastroesophageal reflux disease)    Headache    History of 2019 novel coronavirus disease (COVID-19)    History of blood transfusion    "several" (01/27/2016)   History of stomach ulcers    Hyperlipidemia    Hypertension    Hypothyroidism    On home oxygen therapy    "2L; 9PM til 9AM" (06/27/2017)   PAD (peripheral artery disease) (HCC)    a. prior gangrene, toe amputations, intervention.   PAF (paroxysmal atrial fibrillation) ([place])    Paraesophageal hernia    Perforated gastric ulcer ([place])    Pneumonia    "a few times" (06/27/2017)   Seasonal allergies    SIADH (syndrome of inappropriate ADH production) ([place])    [name] 01/10/2015   Small bowel obstruction ([place])    "I don't know how many" (01/11/2015)   Stroke ([place])    several- no  residual   Type II diabetes mellitus ([place])    "related to prednisone use  for > 20 yr; once predinose stopped; no more DM RX" (01/27/2016)   UTI (urinary tract infection) 02/08/2016   Ventral hernia with bowel obstruction            Objective: Physical Exam General: The patient is alert and oriented x3 in no acute distress.  Dermatology: Ulcer noted to the medial aspect of the first MTPJ left which unfortunately has deteriorated over the last 2 weeks.  Drainage noted.  There is exposed joint capsule and possible bone to the area  Vascular: There continues to be moderate edema to the left foot ankle and leg.  There is also some pain with calf compression.  Skin is warm to touch compared to the contralateral limb.  VAS [place] ABI W/WO TBI 09/10/2020 Right: Resting right ankle-brachial index is within normal range using the  posterior tibial artery . No evidence of significant right lower extremity  arterial disease.   Left: Resting left ankle-brachial index indicates noncompressible left  lower extremity arteries. Unable to obtain left great toe index due to  bandage.   Neurological: Epicritic and protective threshold diminished bilaterally.   Musculoskeletal Exam: Status post TMA right foot.  Hallux valgus  left with overlying ulcer and degenerative changes possible exposed bone and joint capsule.  Second and third toe amputation left.  Radiographic exam bilateral feet 09/22/2020: Transmetatarsal amputation noted right foot.  Second and third toe amputations left.  No significant osseous erosions.  Left foot demonstrates history of left second and third toe amputation with resection of the metatarsal heads as well and hallux valgus deformity.  Diffuse degenerative changes noted.  No gas within the tissue.  Cortical erosive changes noted to the medial aspect of the first metatarsal head just underlying the area of the wound which is consistent with osteomyelitis of the distal portion of the  first metatarsal.  Assessment: 1. s/p transmetatarsal amputation right foot. DOS: 11/14/2019 2.  Ulcer right lateral forefoot amputation stump 3.  Hallux valgus left with ulcer down to level bone and joint. H/o 2nd toe amputation left 4.  Osteomyelitis first metatarsal left foot  Plan of Care:  1. Patient was evaluated.  2.  Continue Betadine with dry sterile dressings daily bilateral 3.  Continue diabetic shoes with metatarsal pads to offload pressure from the right forefoot amputation stump 3.  Cultures taken to the first MTPJ left and sent to pathology for culture and sensitivity 4.  Today we had a very frank discussion regarding the deterioration of the wound specifically to the left first MTPJ.  I believe this is a poor chance of healing and I feel it is appropriate for amputation.  Patient already has prior amputations of the digits to this foot.  Patient would benefit most from transmetatarsal amputation of the left foot.  To prevent forefoot ulcers developing postoperatively she would also benefit from tendo Achilles lengthening.  Tendo Achilles lengthening also to the right lower extremity to alleviate pressure from the forefoot and allow the forefoot wounds to heal 5. we discussed the conservative versus surgical management of the presenting pathology. The patient opts for surgical management. All possible complications and details of the procedure were explained. All patient questions were answered. No guarantees were expressed or implied. 5. Authorization for surgery was initiated today. Surgery will consist of transmetatarsal amputation left.  Tendo Achilles lengthening bilateral 6.  Patient will need vascular optimization and clearance prior to surgery as well as medical clearance from PCP 7.  Return to clinic 1 week postop   Edrick Kins, DPM [facility]  Dr. Edrick Kins, DPM    [address],  [address]                 Office [phone]  Fax [phone]

## 2020-09-23 ENCOUNTER — Encounter: Payer: Self-pay | Admitting: Orthopedic Surgery

## 2020-09-23 ENCOUNTER — Ambulatory Visit (INDEPENDENT_AMBULATORY_CARE_PROVIDER_SITE_OTHER): Payer: Medicare Other | Admitting: Orthopedic Surgery

## 2020-09-23 ENCOUNTER — Telehealth: Payer: Self-pay

## 2020-09-23 ENCOUNTER — Other Ambulatory Visit: Payer: Self-pay

## 2020-09-23 VITALS — BP 130/70 | HR 70 | Temp 97.1°F | Ht 66.0 in | Wt 128.0 lb

## 2020-09-23 DIAGNOSIS — L97523 Non-pressure chronic ulcer of other part of left foot with necrosis of muscle: Secondary | ICD-10-CM | POA: Diagnosis not present

## 2020-09-23 DIAGNOSIS — J449 Chronic obstructive pulmonary disease, unspecified: Secondary | ICD-10-CM | POA: Diagnosis not present

## 2020-09-23 DIAGNOSIS — I10 Essential (primary) hypertension: Secondary | ICD-10-CM

## 2020-09-23 DIAGNOSIS — Z89431 Acquired absence of right foot: Secondary | ICD-10-CM

## 2020-09-23 DIAGNOSIS — I4819 Other persistent atrial fibrillation: Secondary | ICD-10-CM

## 2020-09-23 DIAGNOSIS — E1142 Type 2 diabetes mellitus with diabetic polyneuropathy: Secondary | ICD-10-CM | POA: Diagnosis not present

## 2020-09-23 DIAGNOSIS — E440 Moderate protein-calorie malnutrition: Secondary | ICD-10-CM

## 2020-09-23 DIAGNOSIS — E11621 Type 2 diabetes mellitus with foot ulcer: Secondary | ICD-10-CM | POA: Diagnosis not present

## 2020-09-23 MED ORDER — PANTOPRAZOLE SODIUM 40 MG PO TBEC: 40.0000 mg | DELAYED_RELEASE_TABLET | Freq: Every day | ORAL | 2 refills | Status: DC

## 2020-09-23 NOTE — Telephone Encounter (Signed)
If protonix was approved then she can continue on that. Thank you

## 2020-09-23 NOTE — Patient Instructions (Signed)
Will send today's note to [place] and [place]

## 2020-09-23 NOTE — Telephone Encounter (Signed)
Called pharmacy and will start her on nexium packet that she can take through her g-tube this is on her formulary. Please notify daughter. They will call her when it is ready

## 2020-09-23 NOTE — Telephone Encounter (Signed)
**Note De-Identified  Obfuscation** Breella Vallone Key: [id number] help? Call us at [phone] Outcome: Your PA has been resolved, no additional PA is required.  Drug: Pantoprazole Sodium 40MG  dr tablets Form: [employer] Electronic PA Form (2017 NCPDP)  I have notified [facility] of this outcome.

## 2020-09-23 NOTE — Telephone Encounter (Signed)
Patient daughter notified

## 2020-09-23 NOTE — Telephone Encounter (Signed)
Erin with [facility] called and stated that her ran the Nexium 10mg  but when running the 20mg  it was Denied by insurance.   But there is a note in Epic for today from [place] at [facility] stating that PA was Authorized for Pantoprazole 40mg .

## 2020-09-23 NOTE — Progress Notes (Signed)
Careteam: Patient Care Team: [name], NP as PCP - General (Geriatric Medicine) Dorothy Spark, MD (Inactive) as PCP - Cardiology (Cardiology) Edrick Kins, DPM as Consulting Physician (Podiatry) Dorothy Spark, MD (Inactive) as Consulting Physician (Cardiology) [name], NP as Nurse Practitioner (Geriatric Medicine) Camillo Flaming, [place] as Referring Physician (Optometry) [name], RN as [place] Management  Seen by: Windell Moulding, AGNP-C  PLACE OF SERVICE:  [place]  Advanced Directive information    Allergies  Allergen Reactions   Penicillins Itching, Rash and Other (See Comments)    Tolerated amoxicillin, unasyn, zosyn & cephalosporins in the past. Did it involve swelling of the face/tongue/throat, SOB, or low BP? No Did it involve sudden or severe rash/hives, skin peeling, or any reaction on the inside of your mouth or nose? No Did you need to seek medical attention at a hospital or doctor's office? Unknown When did it last happen?      5+ years If all above answers are "NO", may proceed with cephalosporin use.     No chief complaint on file.    HPI: [name] seen today for surgical clearance.   Past medical history includes: hypertension, atrial fibrillation, carotid stenosis, peripheral artery disease, COPD with asthma, diverticulitis, type II diabetes, hypothyroidism, CKD stage III, lower leg edema, and previous amputations to right foot.   0527-05/28 she presented to [facility] ED with ongoing left foot pain and swelling. X-ray left foot negative for osteomyelitis. She was diagnosed cellulitis and advised to start outpatient doxycycline, which was prescribed by Dr. Amalia Hailey prior to ED visit. New ulcer was noted to the left medial aspect of first MTPJ by Dr. Amalia Hailey 05/27 in office.   09/22/2020 ulcer showed progressive deterioration and drainage within the last 2 weeks. Risk factors discussed with  Dr. Amalia Hailey, he believes this wound has a poor chance of healing and recommended transmetatarsal amputation of the left foot.   She was seen by Dr. Meda Coffee with cardiology 06/22/2020. Echo 05/19/2019 LV EF 65-70%, mild aortic valve sclerosis present. Carotid dopplers 05/2019, right carotid with velocities 1-39%, left carotid with velocities 1-39%, vertebrals demonstrated antegrade flow. EKG sinus rhythm. Remains on hydralazine and Imdur. A-fin, rate controlled. Remains on coumadin 7.5 mg every Sunday and Thursday and 5 mg all other days for clot prevention. INR 2.9 09/17/2020. Today, she denies chest pain, sob, dizziness, headaches, or calf pain.   Diabetes: A1c 6.2 /23/2022, BUN/creat 61/1.63 09/16/2020  WBC 6.3, hgb 9.5 08/27/2020.    Review of Systems:  Review of Systems  Constitutional:  Negative for chills, fever, malaise/fatigue and weight loss.  HENT: Negative.    Eyes:  Negative for blurred vision.       Glasses  Respiratory:  Negative for cough, sputum production, shortness of breath and wheezing.   Cardiovascular:  Positive for leg swelling. Negative for chest pain and palpitations.  Gastrointestinal:  Positive for constipation. Negative for abdominal pain, blood in stool, diarrhea, heartburn, nausea and vomiting.  Genitourinary:  Positive for frequency. Negative for dysuria and hematuria.  Musculoskeletal:  Positive for joint pain. Negative for falls and myalgias.  Skin: Negative.   Neurological:  Positive for weakness. Negative for dizziness, tremors, seizures and headaches.  Psychiatric/Behavioral:  Negative for depression. The patient is not nervous/anxious and does not have insomnia.    Past Medical History:  Diagnosis Date   Acute respiratory failure (HCC)    Anemia    previous  blood transfusions   Arthritis    "all over"   Asthma    Atrial flutter (HCC)    Bradycardia    requiring previous d/c of BB and reduction of amiodarone   CAD (coronary artery disease)     nonobstructive per notes   Chronic diastolic CHF (congestive heart failure) ([place])    CKD (chronic kidney disease), stage III ([place])    Complication of blood transfusion    "got the wrong blood type at [facility] in ~ 2015; no adverse reaction that we are aware of"/daughter, [name] (01/27/2016)   COPD (chronic obstructive pulmonary disease) ([place])    Depression    "light case"   DVT (deep venous thrombosis) ([place]) 01/2016   a. LLE DVT 01/2016 - switched from Eliquis to Coumadin.   Dyspnea    with some activity   Gastric stenosis    a. s/p stomach tube   GERD (gastroesophageal reflux disease)    Headache    History of 2019 novel coronavirus disease (COVID-19)    History of blood transfusion    "several" (01/27/2016)   History of stomach ulcers    Hyperlipidemia    Hypertension    Hypothyroidism    On home oxygen therapy    "2L; 9PM til 9AM" (06/27/2017)   PAD (peripheral artery disease) (HCC)    a. prior gangrene, toe amputations, intervention.   PAF (paroxysmal atrial fibrillation) ([place])    Paraesophageal hernia    Perforated gastric ulcer ([place])    Pneumonia    "a few times" (06/27/2017)   Seasonal allergies    SIADH (syndrome of inappropriate ADH production) ([place])    [name] 01/10/2015   Small bowel obstruction ([place])    "I don't know how many" (01/11/2015)   Stroke ([place])    several- no residual   Type II diabetes mellitus ([place])    "related to prednisone use  for > 20 yr; once predinose stopped; no more DM RX" (01/27/2016)   UTI (urinary tract infection) 02/08/2016   Ventral hernia with bowel obstruction    Past Surgical History:  Procedure Laterality Date   ABDOMINAL AORTOGRAM N/A 09/21/2016   Procedure: Abdominal Aortogram;  Surgeon: Waynetta Sandy, MD;  Location: [place] [facility];  Service: Cardiovascular;  Laterality: N/A;   AMPUTATION Left 09/25/2016   Procedure: AMPUTATION DIGIT- LEFT 2ND AND 3RD TOES;  Surgeon: Rosetta Posner, MD;  Location: [facility];  Service:  Vascular;  Laterality: Left;   AMPUTATION Right 05/03/2018   Procedure: AMPUTATION RIGHT GREAT TOE;  Surgeon: Rosetta Posner, MD;  Location: [facility];  Service: Vascular;  Laterality: Right;   CATARACT EXTRACTION W/ INTRAOCULAR LENS  IMPLANT, BILATERAL     CHOLECYSTECTOMY OPEN     COLECTOMY     ESOPHAGOGASTRODUODENOSCOPY N/A 01/19/2014   Procedure: ESOPHAGOGASTRODUODENOSCOPY (EGD);  Surgeon: Irene Shipper, MD;  Location: [facility];  Service: Endoscopy;  Laterality: N/A;   ESOPHAGOGASTRODUODENOSCOPY N/A 01/20/2014   Procedure: ESOPHAGOGASTRODUODENOSCOPY (EGD);  Surgeon: Irene Shipper, MD;  Location: [facility];  Service: Endoscopy;  Laterality: N/A;   ESOPHAGOGASTRODUODENOSCOPY N/A 03/19/2014   Procedure: ESOPHAGOGASTRODUODENOSCOPY (EGD);  Surgeon: Milus Banister, MD;  Location: [facility];  Service: Endoscopy;  Laterality: N/A;   ESOPHAGOGASTRODUODENOSCOPY N/A 07/08/2015   Procedure: ESOPHAGOGASTRODUODENOSCOPY (EGD);  Surgeon: Doran Stabler, MD;  Location: [facility] ENDOSCOPY;  Service: Endoscopy;  Laterality: N/A;   ESOPHAGOGASTRODUODENOSCOPY (EGD) WITH PROPOFOL N/A 09/15/2015   Procedure: ESOPHAGOGASTRODUODENOSCOPY (EGD) WITH PROPOFOL;  Surgeon: Manus Gunning, MD;  Location: [facility];  Service: Gastroenterology;  Laterality: N/A;   GASTROJEJUNOSTOMY     hx/notes 01/10/2015   GASTROJEJUNOSTOMY N/A 09/23/2015   Procedure: OPEN GASTROJEJUNOSTOMY TUBE PLACEMENT;  Surgeon: Arta Bruce Kinsinger, MD;  Location: [facility];  Service: General;  Laterality: N/A;   GLAUCOMA SURGERY Bilateral    HERNIA REPAIR  2015   IR CM INJ ANY COLONIC TUBE W/FLUORO  01/05/2017   IR CM INJ ANY COLONIC TUBE W/FLUORO  06/07/2017   IR CM INJ ANY COLONIC TUBE W/FLUORO  11/05/2017   IR CM INJ ANY COLONIC TUBE W/FLUORO  09/18/2018   IR CM INJ ANY COLONIC TUBE W/FLUORO  03/06/2019   IR CM INJ ANY COLONIC TUBE W/FLUORO  03/11/2019   IR GASTR TUBE CONVERT GASTR-JEJ PER W/FL MOD SED  04/30/2020   IR GENERIC HISTORICAL   01/07/2016   IR GJ TUBE CHANGE 01/07/2016 Jacqulynn Cadet, MD [facility]   IR GENERIC HISTORICAL  01/27/2016   IR MECH REMOV OBSTRUC MAT ANY COLON TUBE W/FLUORO 01/27/2016 Markus Daft, MD [facility]   IR GENERIC HISTORICAL  02/07/2016   IR PATIENT EVAL TECH 0-60 MINS Darrell K Allred, PA-C [facility]   IR GENERIC HISTORICAL  02/08/2016   IR GJ TUBE CHANGE 02/08/2016 Greggory Keen, MD [facility]   IR GENERIC HISTORICAL  01/06/2016   IR GJ TUBE CHANGE 01/06/2016 [facility] OUT REF   IR GENERIC HISTORICAL  05/02/2016   IR CM INJ ANY COLONIC TUBE W/FLUORO 05/02/2016 Arne Cleveland, MD [facility]   IR GENERIC HISTORICAL  05/15/2016   IR GJ TUBE CHANGE 05/15/2016 Sandi Mariscal, MD [facility]   IR GENERIC HISTORICAL  06/28/2016   IR GJ TUBE CHANGE 06/28/2016 [facility]   IR GJ TUBE CHANGE  02/20/2017   IR GJ TUBE CHANGE  05/10/2017   IR GJ TUBE CHANGE  07/06/2017   IR GJ TUBE CHANGE  08/02/2017   IR GJ TUBE CHANGE  10/15/2017   IR GJ TUBE CHANGE  12/26/2017   IR GJ TUBE CHANGE  04/08/2018   IR GJ TUBE CHANGE  06/19/2018   IR GJ TUBE CHANGE  03/03/2019   IR GJ TUBE CHANGE  06/06/2019   IR GJ TUBE CHANGE  09/26/2019   IR GJ TUBE CHANGE  01/09/2020   IR GJ TUBE CHANGE  05/12/2020   IR PATIENT EVAL TECH 0-60 MINS  10/19/2016   IR PATIENT EVAL TECH 0-60 MINS  12/25/2016   IR PATIENT EVAL TECH 0-60 MINS  01/29/2017   IR PATIENT EVAL TECH 0-60 MINS  04/04/2017   IR PATIENT EVAL TECH 0-60 MINS  04/30/2017   IR PATIENT EVAL TECH 0-60 MINS  07/31/2017   IR REPLC DUODEN/JEJUNO TUBE PERCUT W/FLUORO  11/14/2016   LAPAROTOMY N/A 01/20/2015   Procedure: EXPLORATORY LAPAROTOMY;  Surgeon: Coralie Keens, MD;  Location: [place];  Service: General;  Laterality: N/A;   LOWER EXTREMITY ANGIOGRAPHY Left 09/21/2016   Procedure: Lower Extremity Angiography;  Surgeon: Waynetta Sandy, MD;  Location: [place] [facility];  Service: Cardiovascular;  Laterality: Left;   LOWER EXTREMITY ANGIOGRAPHY Right 06/27/2017   Procedure:  Lower Extremity Angiography;  Surgeon: Waynetta Sandy, MD;  Location: [place] [facility];  Service: Cardiovascular;  Laterality: Right;   LYSIS OF ADHESION N/A 01/20/2015   Procedure: LYSIS OF ADHESIONS < 1 HOUR;  Surgeon: Coralie Keens, MD;  Location: [place];  Service: General;  Laterality: N/A;   PERIPHERAL VASCULAR BALLOON ANGIOPLASTY Left 09/21/2016   Procedure: Peripheral  Vascular Balloon Angioplasty;  Surgeon: Waynetta Sandy, MD;  Location: [place] [facility] LAB;  Service: Cardiovascular;  Laterality: Left;  drug coated balloon   PERIPHERAL VASCULAR BALLOON ANGIOPLASTY Right 06/27/2017   Procedure: PERIPHERAL VASCULAR BALLOON ANGIOPLASTY;  Surgeon: Waynetta Sandy, MD;  Location: [place] [facility];  Service: Cardiovascular;  Laterality: Right;  SFA/TPTRUNK   TONSILLECTOMY     TRANSMETATARSAL AMPUTATION Right 11/14/2019   Procedure: TRANSMETATARSAL AMPUTATION RIGHT FOOT;  Surgeon: Edrick Kins, DPM;  Location: [place];  Service: Podiatry;  Laterality: Right;   TUBAL LIGATION     VENTRAL HERNIA REPAIR  2015   incarcerated ventral hernia ([facility] 09/2013)/notes 01/10/2015   Social History:   reports that she has never smoked. She quit smokeless tobacco use about 40 years ago.  Her smokeless tobacco use included snuff. She reports that she does not drink alcohol and does not use drugs.  Family History  Problem Relation Age of Onset   Stroke Mother    Hypertension Mother    Diabetes Brother    Glaucoma Son    Glaucoma Son    Heart attack Neg Hx    Colon cancer Neg Hx    Stomach cancer Neg Hx     Medications: Patient's Medications  New Prescriptions   No medications on file  Previous Medications   ACETAMINOPHEN (TYLENOL) 500 MG TABLET    Take 500 mg by mouth every 6 (six) hours as needed.   AMLODIPINE (NORVASC) 5 MG TABLET    Take 1 tablet (5 mg total) by mouth daily.   ATORVASTATIN (LIPITOR) 10 MG TABLET    TAKE 1 TABLET BY MOUTH EVERY DAY   B COMPLEX  VITAMINS TABLET    Take 1 tablet by mouth daily.    BENZOCAINE (AMERICAINE) 20 % RECTAL OINTMENT    Place rectally every 3 (three) hours as needed for pain.   CALCIUM-VITAMIN D (OSCAL WITH D) 500-200 MG-UNIT PER TABLET    Take 1 tablet by mouth daily with breakfast.    CARBOXYMETHYLCELLULOSE SODIUM (ARTIFICIAL TEARS OP)    Place 1 drop into both eyes 4 (four) times daily.   CYCLOSPORINE (RESTASIS) 0.05 % OPHTHALMIC EMULSION    USE 1 DROP INTO BOTH EYES TWICE DAILY   DULERA 200-5 MCG/ACT AERO    TAKE TWO PUFFS BY MOUTH TWICE DAILY   DULOXETINE (CYMBALTA) 30 MG CAPSULE    TAKE 1 CAPSULE BY MOUTH EVERY DAY   FERROUS SULFATE 220 (44 FE) MG/5ML SOLUTION    TAKE 7.4 MLS (325 MG TOTAL) BY MOUTH DAILY.   FLUTICASONE (FLONASE) 50 MCG/ACT NASAL SPRAY    SPRAY 2 SPRAYS INTO EACH NOSTRIL EVERY DAY   FUROSEMIDE (LASIX) 40 MG TABLET    TAKE 0.5-1 TABLETS (20-40 MG TOTAL) BY MOUTH SEE ADMIN INSTRUCTIONS. TAKE 20 MG DAILY, MAY INSTEAD TAKE 40 MG DAILY AS NEEDED FOR EDEMA   GABAPENTIN (NEURONTIN) 300 MG CAPSULE    Take 1 capsule (300 mg total) by mouth 2 (two) times daily.   GENTAMICIN CREAM (GARAMYCIN) 0.1 %    Apply 1 application topically daily as needed (dry skin).   HYDRALAZINE (APRESOLINE) 50 MG TABLET    TAKE 1 TABLET BY MOUTH THREE TIMES A DAY   INSULIN ASPART (NOVOLOG) 100 UNIT/ML FLEXPEN    Inject into the skin as needed for high blood sugar. New sliding Scale If BS is 151 - 251 = 2 units,  If BS is 251 -  350 = 4 units,  If  BS is 351 - 450 = 6 units Call provider if BS runs more than 400   INSULIN PEN NEEDLE (B-D UF III MINI PEN NEEDLES) 31G X 5 MM MISC    Use twice daily with giving insulin injections. Dx E11.9   IPRATROPIUM-ALBUTEROL (DUONEB) 0.5-2.5 (3) MG/3ML SOLN    Take 3 mLs by nebulization every 6 (six) hours as needed (shortness of breath).   ISOSORBIDE MONONITRATE (IMDUR) 30 MG 24 HR TABLET    TAKE 2 TABLETS BY MOUTH DAILY.   LANTUS SOLOSTAR 100 UNIT/ML SOLOSTAR PEN    INJECT 8 UNITS INTO THE  SKIN AT BEDTIME.   LATANOPROST (XALATAN) 0.005 % OPHTHALMIC SOLUTION    1 drop at bedtime.   LEVALBUTEROL (XOPENEX HFA) 45 MCG/ACT INHALER    Inhale 2 puffs into the lungs every 6 (six) hours as needed for wheezing.   LEVOTHYROXINE (SYNTHROID) 125 MCG TABLET    TAKE 1 TABLET BY MOUTH EVERY DAY   LORATADINE (CLARITIN) 10 MG TABLET    Take 10 mg by mouth daily as needed for allergies.   MULTIPLE VITAMIN (MULTIVITAMIN WITH MINERALS) TABS TABLET    Take 1 tablet by mouth daily.   NUTRITIONAL SUPPLEMENTS (FEEDING SUPPLEMENT, OSMOLITE 1.5 CAL,) LIQD    Place 711 mLs into feeding tube See admin instructions. Take 711 ml over a 12 hour period per day   NYSTATIN (MYCOSTATIN) 100000 UNIT/ML SUSPENSION    Take 5 mLs (500,000 Units total) by mouth 4 (four) times daily.   NYSTATIN (MYCOSTATIN/NYSTOP) POWDER    Apply 1 application topically as needed (skin irritation).   NYSTATIN CREAM (MYCOSTATIN)    APPLY TO AFFECTED AREA TWICE A DAY   OXYCODONE (ROXICODONE) 5 MG IMMEDIATE RELEASE TABLET    Take 1 tablet (5 mg total) by mouth every 4 (four) hours as needed for severe pain. May take 1/2 tab q4hrs as needed for mild pain.   PANTOPRAZOLE (PROTONIX) 40 MG TABLET    Take 1 tablet (40 mg total) by mouth daily.   POLYETHYLENE GLYCOL (MIRALAX / GLYCOLAX) 17 G PACKET    Take 17 g by mouth daily as needed for mild constipation.   PROBIOTIC PRODUCT (EQL PROBIOTIC COLON SUPPORT PO)    Take 1 capsule by mouth daily.   SENNA-DOCUSATE (SENOKOT-S) 8.6-50 MG TABLET    Take 1 tablet by mouth as needed for mild constipation.   WARFARIN (COUMADIN) 2.5 MG TABLET    TAKE 2-3 TABLETS BY MOUTH DAILY AS DIRECTED BY COUMADIN CLINIC.   WATER FOR IRRIGATION, STERILE (FREE WATER) SOLN    Place 180 mLs into feeding tube 3 (three) times daily.    WHITE PETROLATUM-MINERAL OIL (SYSTANE NIGHTTIME) OINT    Place 1 application into both eyes at bedtime.   Modified Medications   No medications on file  Discontinued Medications   DOXYCYCLINE  (VIBRA-TABS) 100 MG TABLET    Take 1 tablet (100 mg total) by mouth 2 (two) times daily.    Physical Exam:  Vitals:   09/23/20 1346  BP: 130/70  Pulse: 70  Temp: (!) 97.1 F (36.2 C)  TempSrc: Temporal  SpO2: 99%  Weight: 128 lb (58.1 kg)  Height: 5\' 6"  (1.676 m)   Body mass index is 20.66 kg/m. Wt Readings from Last 3 Encounters:  09/23/20 128 lb (58.1 kg)  08/27/20 129 lb 13.6 oz (58.9 kg)  07/05/20 129 lb 12.8 oz (58.9 kg)    Physical Exam Vitals reviewed.  Constitutional:      General:  She is not in acute distress. HENT:     Head: Normocephalic.     Right Ear: There is no impacted cerumen.     Left Ear: There is no impacted cerumen.     Nose: Nose normal.     Mouth/Throat:     Mouth: Mucous membranes are moist.  Eyes:     General:        Right eye: No discharge.        Left eye: No discharge.  Neck:     Vascular: No carotid bruit.  Cardiovascular:     Rate and Rhythm: Normal rate and regular rhythm.     Pulses: Normal pulses.     Heart sounds: Normal heart sounds. No murmur heard. Pulmonary:     Effort: Pulmonary effort is normal. No respiratory distress.     Breath sounds: Normal breath sounds. No wheezing.  Abdominal:     General: Bowel sounds are normal. There is no distension.     Palpations: Abdomen is soft.     Tenderness: There is no abdominal tenderness.  Musculoskeletal:     Cervical back: Normal range of motion.     Right lower leg: Edema present.     Left lower leg: Edema present.     Comments: +1 pitting  Lymphadenopathy:     Cervical: No cervical adenopathy.  Skin:    General: Skin is warm and dry.     Capillary Refill: Capillary refill takes less than 2 seconds.     Comments: Diabetic ulcer to left foot, UTA due to dressing.   Neurological:     General: No focal deficit present.     Mental Status: She is alert and oriented to person, place, and time.     Motor: Weakness present.     Gait: Gait abnormal.     Comments: Walker,  wheelchair  Psychiatric:        Behavior: Behavior normal.    Labs reviewed: Basic Metabolic Panel: Recent Labs    11/15/19 0506 04/22/20 1232 05/07/20 1436 06/02/20 1126 08/27/20 1732  NA 134*   < > 138 134* 132*  K 4.3   < > 4.5 4.5 4.7  CL 100   < > 101 99 98  CO2 24   < > 25 26 26   GLUCOSE 162*   < > 100 102* 104*  BUN 37*   < > 48* 52* 52*  CREATININE 1.37*   < > 1.53* 1.43* 1.46*  CALCIUM 9.3   < > 9.6 9.4 9.2  MG 2.1  --   --   --   --   PHOS 4.4  --   --   --   --    < > = values in this interval not displayed.   Liver Function Tests: Recent Labs    04/29/20 1827 04/30/20 0538 05/01/20 0635 05/07/20 1436  AST 34 31 31 25   ALT 24 22 20 20   ALKPHOS 77 73 65  --   BILITOT 1.1 0.8 0.6 0.5  PROT 8.5* 7.9 7.4 7.5  ALBUMIN 3.9 3.7 3.2*  --    Recent Labs    09/24/19 2000 04/22/20 1232  LIPASE 24 25   No results for input(s): AMMONIA in the last 8760 hours. CBC: Recent Labs    11/15/19 0506 04/22/20 1232 05/01/20 0635 05/07/20 1436 08/27/20 1732  WBC 4.9   < > 5.1 5.7 6.3  NEUTROABS 4.4  --   --  3,728 4.5  HGB  9.3*   < > 10.5* 10.5* 9.5*  HCT 27.7*   < > 32.8* 32.2* 28.4*  MCV 90.2   < > 98.5 96.1 95.9  PLT 193   < > 223 225 220   < > = values in this interval not displayed.   Lipid Panel: No results for input(s): CHOL, HDL, LDLCALC, TRIG, CHOLHDL, LDLDIRECT in the last 8760 hours. TSH: No results for input(s): TSH in the last 8760 hours. A1C: Lab Results  Component Value Date   HGBA1C 6.2 (H) 04/30/2020     Assessment/Plan 1. Persistent atrial fibrillation (HCC) - EKG sinus rhythm 06/22/2020 - rate controlled without medication, beta blocker stopped 2020 d/t bradycardia - coumadin for clot prevention - advised to stop coumadin once week prior to procedure  2. Type 2 diabetes mellitus with peripheral neuropathy (HCC) - Hemoglobin A1c - 6.2 09/23/2020 - cont Lantus 8 units Qhs  3. COPD with asthma ([place]) - denies sob  - cont Dulera  bid - cont duonebs and albuterol prn  6. Diabetic polyneuropathy associated with type 2 diabetes mellitus (HCC) - stable with Cymbalta 30 mg daily  7. Essential hypertension, benign - controlled - cont hydralazine - CBC with Differential/Platelet; Future - Basic Metabolic Panel- BUN/creat 61/1.63 09/23/2020  8. Diabetic ulcer of toe of left foot associated with type 2 diabetes mellitus, with necrosis of muscle (HCC) - contributed to advanced age, diabetes and poor circulation - recommend surgical intervention per Dr. Amalia Hailey   Total Time: 38 minutes. Greater than 50% of total tim spent doing patient education, health maintenance and medication management.   Next appt: none Rodarius Kichline Elcho, [facility] [phone]

## 2020-09-24 ENCOUNTER — Telehealth: Payer: Self-pay | Admitting: *Deleted

## 2020-09-24 LAB — BASIC METABOLIC PANEL
BUN/Creatinine Ratio: 37 (calc) — ABNORMAL HIGH (ref 6–22)
BUN: 61 mg/dL — ABNORMAL HIGH (ref 7–25)
CO2: 26 mmol/L (ref 20–32)
Calcium: 9.1 mg/dL (ref 8.6–10.4)
Chloride: 103 mmol/L (ref 98–110)
Creat: 1.63 mg/dL — ABNORMAL HIGH (ref 0.60–0.88)
Glucose, Bld: 102 mg/dL (ref 65–139)
Potassium: 5.1 mmol/L (ref 3.5–5.3)
Sodium: 137 mmol/L (ref 135–146)

## 2020-09-24 LAB — HEMOGLOBIN A1C
Hgb A1c MFr Bld: 6.2 % of total Hgb — ABNORMAL HIGH (ref <5.7)
Mean Plasma Glucose: 131 mg/dL
eAG (mmol/L): 7.3 mmol/L

## 2020-09-24 NOTE — Telephone Encounter (Signed)
Patient's  daughter([name])calling for the status of a recommendation to a vascular doctor. Please advise.  Per notes 09/22/20 states that patient will need vascular optimization and clearance prior to surgery and a medical clearance from pcp.  Returned call to patient's daughter, no answer, left vmessage for call back.

## 2020-09-24 NOTE — Telephone Encounter (Signed)
Note Faxed to [facility] Fax: [phone]

## 2020-09-24 NOTE — Telephone Encounter (Signed)
-----   Message from Yvonna Alanis, NP sent at 09/24/2020  1:45 PM EDT ----- Regarding: Can you fax this note? I saw Ms. Litzinger for surgical clearance. I was asked to fax my note to their office. I tried to route,my note but office did not show up. Can you fax to [place] [phone].   Thank you- I hope you have a wonderful weekend

## 2020-09-25 LAB — WOUND CULTURE
MICRO NUMBER:: 12037542
SPECIMEN QUALITY:: ADEQUATE

## 2020-09-27 NOTE — Telephone Encounter (Signed)
Patient's daughter is wanting to speak w/ Dr Amalia Hailey about her mom's vascular study and a possible referral to see a vascular doctor before her upcoming surgery.Please call.

## 2020-09-27 NOTE — Telephone Encounter (Signed)
Returned call to patient's daughter for clarification on her recent message.No answer, left vmessage for call back.

## 2020-09-28 ENCOUNTER — Other Ambulatory Visit: Payer: Self-pay | Admitting: Podiatry

## 2020-09-28 DIAGNOSIS — L97522 Non-pressure chronic ulcer of other part of left foot with fat layer exposed: Secondary | ICD-10-CM

## 2020-09-28 DIAGNOSIS — L97512 Non-pressure chronic ulcer of other part of right foot with fat layer exposed: Secondary | ICD-10-CM

## 2020-09-28 DIAGNOSIS — I70235 Atherosclerosis of native arteries of right leg with ulceration of other part of foot: Secondary | ICD-10-CM

## 2020-09-28 NOTE — Telephone Encounter (Signed)
Spoke with daughter, Shannon [name], on the phone.  I just placed a new order for a vascular consult at [facility].  Could you please follow-up and make sure she gets an appointment with them prior to surgery.  Surgery scheduled for 10/08/2020.

## 2020-09-28 NOTE — Telephone Encounter (Signed)
Called and spoke with [facility] at [facility],have received the referral. I explained to please schedule ASAP, and she said that they will notate request as urgent.

## 2020-09-28 NOTE — Progress Notes (Signed)
Spoke with daughter, [name], on the phone.  She was concerned that they have not been contacted by vascular.  Both myself and the patient would like to speak with vascular prior to surgery to ensure that her circulation is optimized is much as possible.  New order placed for vascular consult.  Our office will follow up.    Edrick Kins, DPM [facility]  Dr. Edrick Kins, DPM    [address], [place] [address]                Office [phone]  Fax [phone]

## 2020-09-29 ENCOUNTER — Other Ambulatory Visit: Payer: Self-pay | Admitting: *Deleted

## 2020-09-29 NOTE — Patient Outreach (Signed)
[facility]) Care Management  09/29/2020  Shannon [name] Aug 04, 1923 [id number]   Voice message received from member stating she is needing another surgery, request call back.  Call placed to member, informed of recent findings of non-healing wound on left foot, will need transmetatarsal removal.  Daughter has been still helping to manage conditions and plan of care.  Denies any urgent concerns, encouraged to contact this care manager with questions.  Agrees to follow up within the next 2 weeks after surgery.   Goals Addressed             This Visit's Progress    COMPLETED: Remove Barriers to Better Nutrition       Timeframe:  Long-Range Goal Priority:  Medium Start Date:          5/4            Expected End Date:      7/4  Barriers: Other Nutrition - Feeding tube     - make food easier to swallow by adding gravy or drinking water between bites - take my supplements as prescribed    Why is this important?   Eating healthy is not always easy.  You may not feel hungry or food might not taste good.  It may be a lot of work to shop or make meals.  There are things you can do to make it easier.    Notes:   2/7 - feeding tube to be replaced on 2/9  3/8 - Confirmed tube is replaced, no leaking  4/6 - New issues with tube, awaiting test results for possible infection  5/4 - Confirmed no infection, having intermittent constipation.  Uses an herbal tea as well as Sennekot and Miralax for relief  6/7 - no longer having issues with feeding tube but now has thrush due to antibiotic use.  Daughter called office and has order for nystatin sent to pharmacy.      [facility] - Make and Keep All Appointments   On track      Timeframe:  Short-Term Goal Priority:  Medium Start Date:       5/4           Expected End Date:     7/29  Barriers: Knowledge                  - ask family or friend for a ride - call to cancel if needed - keep a calendar with appointment  dates    Why is this important?   Part of staying healthy is seeing the doctor for follow-up care.  If you forget your appointments, there are some things you can do to stay on track.    Notes:   11/22 - Upcoming appointments reviewed with member  12/21 - reminded of follow up with podiatrist  2/7 - Reviewed upcoming appointments with member, PCP on 3/9, podiatrist on 3/16  4/6 - Encouraged to follow up with GI regarding feeding tube and test results  5/4 - Continues to have issues with feet, particularly left foot bunion.  Appointment with podiatrist on 5/18, will continue to use cream and contact provider if in need of sooner visit  6/7 - follow up with podiatrist tomorrow and 6/22.  Still has hardened area on right foot and cellulitis on left leg/foot.  Appointment with PCP on 7/11  6/29 - attended 6/22 podiatry appointment, will need another surgery.  PCP appointment 6/23 clearing her for surgery.  Awaiting call  from vascular specialist for appointment prior to surgery date       [facility] - Perform Foot Care   On track    Barriers: Knowledge   Timeframe:  Long-Range Goal Priority:  High Start Date:       6/7              Expected End Date:    9/7          - check feet daily for cuts, sores or redness - wash and dry feet carefully every day - wear comfortable, cotton socks - wear comfortable, well-fitting shoes    Why is this important?   Good foot care is very important when you have diabetes.  There are many things you can do to keep your feet healthy and catch a problem early.    Notes:   11/22 - Reviewed updated wound care per most recent MD visit  12/21 - Discussed ongoing wound care with member  2/7 - Reminded of wound care per podiatrist, encouraged to keep wound clean and dry  6/7 - Seen in ED for cellulitis of left foot/leg, completed course of antibiotics, still has swelling.    6/29 - Patient is now needing transmetatarsal removal of the left foot due to  non-healing wound and infection.  Surgery scheduled for 7/8        Kemper Durie, [place], MSN [facility] Care Management  [facility] Manager [phone]

## 2020-09-29 NOTE — Telephone Encounter (Signed)
If the appointment needs to be completed ASAP. It needs to be placed at STAT moving forward.

## 2020-09-30 ENCOUNTER — Other Ambulatory Visit: Payer: Self-pay | Admitting: Vascular Surgery

## 2020-09-30 DIAGNOSIS — L97522 Non-pressure chronic ulcer of other part of left foot with fat layer exposed: Secondary | ICD-10-CM

## 2020-09-30 DIAGNOSIS — I739 Peripheral vascular disease, unspecified: Secondary | ICD-10-CM

## 2020-09-30 NOTE — Progress Notes (Signed)
Please place orders in epic pt. Is scheduled for a preop  

## 2020-09-30 NOTE — Progress Notes (Addendum)
PCP - lov 09-23-20 Windell Moulding NP Cardiologist - LOV Ena Dawley , MD 06-22-20  PPM/ICD -  Device Orders -  Rep Notified -   Chest x-ray -  EKG - 06-22-20 Stress Test -  ECHO - 05-19-19 Cardiac Cath -   BMP,  hgba1c, 09-23-20 epic PT 09-17-20 epic  Sleep Study -  CPAP -   Fasting Blood Sugar -  Checks Blood Sugar _____ times a day  Blood Thinner Instructions:Coumadin left VM with Dr. Amalia Hailey scheduler about pt. Not having instruction. Instructed pt. To call if she has not heard anything from me 7-1. Aspirin Instructions:  ERAS Protcol - PRE-SURGERY Ensure or G2-   COVID TEST- 10-06-20 Activity---  Anesthesia review: CHF,CAD,HTN,CVA,COPD,DM, CKDIII, PAD , feeding tube osmolite and iron supplement only small pills with water   WBC 8.8  Patient denies shortness of breath, fever, cough and chest pain at PAT appointment   All instructions explained to the patient, with a verbal understanding of the material. Patient agrees to go over the instructions while at home for a better understanding. Patient also instructed to self quarantine after being tested for COVID-19. The opportunity to ask questions was provided.

## 2020-09-30 NOTE — Patient Instructions (Addendum)
DUE TO COVID-19 ONLY ONE VISITOR IS ALLOWED TO COME WITH YOU AND STAY IN THE WAITING ROOM ONLY DURING PRE OP AND PROCEDURE DAY OF SURGERY.   TWO VISITOR  MAY VISIT WITH YOU AFTER SURGERY IN YOUR PRIVATE ROOM DURING VISITING HOURS ONLY!  YOU NEED TO HAVE A COVID 19 TEST ON__7-6-22_____ @_______ , THIS TEST MUST BE DONE BEFORE SURGERY,    COVID TESTING SITE [address] [place] [address],   IT IS ON THE RIGHT GOING OUT [address] APPROXIMATELY  2 MINUTES PAST [employer] ON THE RIGHT.   ONCE YOUR COVID TEST IS COMPLETED,  PLEASE Wear a mask when in public           Your procedure is scheduled on: 10-08-20   Report to [facility] Main  Entrance   Report to admitting at        1:30  pM     Call this number if you have problems the morning of surgery [phone]    Remember: Do not eat food :After Midnight. You may have clear liquids until 1230 pm then nothing by mouth     CLEAR LIQUID DIET   Foods Allowed                                                                     Foods Excluded water Black Coffee and tea, regular and decaf                             liquids that you cannot  Plain Jell-O any favor except red or purple                                           see through such as: Fruit ices (not with fruit pulp)                                                 milk, soups, orange juice  Iced Popsicles                                           All solid food Carbonated beverages, regular and diet                                    Cranberry, grape and apple juices Sports drinks like Gatorade Lightly seasoned clear broth or consume(fat free) Sugar, honey syrup   _____________________________________________________________________    BRUSH YOUR TEETH MORNING OF SURGERY AND RINSE YOUR MOUTH OUT, NO CHEWING GUM CANDY OR MINTS.     Take these medicines the morning of surgery with A SIP OF WATER: oxycodone if needed, loratadine,  levothyroxine, amlodipine, hydralazine, gabapentin, Flonase, Cymbalta,inhalers and bring them with you , Imdur How to Manage Your Diabetes Before and  After Surgery  Why is it important to control my blood sugar before and after surgery? Improving blood sugar levels before and after surgery helps healing and can limit problems. A way of improving blood sugar control is eating a healthy diet by:  Eating less sugar and carbohydrates  Increasing activity/exercise  Talking with your doctor about reaching your blood sugar goals High blood sugars (greater than 180 mg/dL) can raise your risk of infections and slow your recovery, so you will need to focus on controlling your diabetes during the weeks before surgery. Make sure that the doctor who takes care of your diabetes knows about your planned surgery including the date and location.  How do I manage my blood sugar before surgery? Check your blood sugar at least 4 times a day, starting 2 days before surgery, to make sure that the level is not too high or low. Check your blood sugar the morning of your surgery when you wake up and every 2 hours until you get to the Short Stay unit. If your blood sugar is less than 70 mg/dL, you will need to treat for low blood sugar: Do not take insulin. Treat a low blood sugar (less than 70 mg/dL) with  cup of clear juice (cranberry or apple), 4 glucose tablets, OR glucose gel. Recheck blood sugar in 15 minutes after treatment (to make sure it is greater than 70 mg/dL). If your blood sugar is not greater than 70 mg/dL on recheck, call [phone] for further instructions. Report your blood sugar to the short stay nurse when you get to Short Stay.  If you are admitted to the hospital after surgery: Your blood sugar will be checked by the staff and you will probably be given insulin after surgery (instead of oral diabetes medicines) to make sure you have good blood sugar levels. The goal for blood sugar control  after surgery is 80-180 mg/dL.   WHAT DO I DO ABOUT MY DIABETES MEDICATION?  Do not take oral diabetes medicines (pills) the morning of surgery.  THE NIGHT BEFORE SURGERY, take 50% of Lantus normal dose if needed       The day of surgery, do not take other diabetes injectables, including Byetta (exenatide), Bydureon (exenatide ER), Victoza (liraglutide), or Trulicity (dulaglutide).  If your CBG is greater than 220 mg/dL, you may take  of your sliding scale  (correction) dose of insulin.                                You may not have any metal on your body including hair pins and              piercings  Do not wear jewelry, make-up, lotions, powders or perfumes, deodorant             Do not wear nail polish on your fingernails or toenails .  Do not shave  48 hours prior to surgery.         Do not bring valuables to the hospital. [place].  Contacts, dentures or bridgework may not be worn into surgery.      Patients discharged the day of surgery will not be allowed to drive home. IF YOU ARE HAVING SURGERY AND GOING HOME THE SAME DAY, YOU MUST HAVE AN ADULT TO DRIVE YOU HOME AND BE  WITH YOU FOR 24 HOURS. YOU MAY GO HOME BY TAXI OR UBER OR ORTHERWISE, BUT AN ADULT MUST ACCOMPANY YOU HOME AND STAY WITH YOU FOR 24 HOURS.  Name and phone number of your driver:  Special Instructions: N/A              Please read over the following fact sheets you were given: _____________________________________________________________________             [facility] - Preparing for Surgery Before surgery, you can play an important role.  Because skin is not sterile, your skin needs to be as free of germs as possible.  You can reduce the number of germs on your skin by washing with CHG (chlorahexidine gluconate) soap before surgery.  CHG is an antiseptic cleaner which kills germs and bonds with the skin to continue killing germs even after  washing. Please DO NOT use if you have an allergy to CHG or antibacterial soaps.  If your skin becomes reddened/irritated stop using the CHG and inform your nurse when you arrive at Short Stay. Do not shave (including legs and underarms) for at least 48 hours prior to the first CHG shower.  You may shave your face/neck. Please follow these instructions carefully:  1.  Shower with CHG Soap the night before surgery and the  morning of Surgery.  2.  If you choose to wash your hair, wash your hair first as usual with your  normal  shampoo.  3.  After you shampoo, rinse your hair and body thoroughly to remove the  shampoo.                           4.  Use CHG as you would any other liquid soap.  You can apply chg directly  to the skin and wash                       Gently with a scrungie or clean washcloth.  5.  Apply the CHG Soap to your body ONLY FROM THE NECK DOWN.   Do not use on face/ open                           Wound or open sores. Avoid contact with eyes, ears mouth and genitals (private parts).                       Wash face,  Genitals (private parts) with your normal soap.             6.  Wash thoroughly, paying special attention to the area where your surgery  will be performed.  7.  Thoroughly rinse your body with warm water from the neck down.  8.  DO NOT shower/wash with your normal soap after using and rinsing off  the CHG Soap.                9.  Pat yourself dry with a clean towel.            10.  Wear clean pajamas.            11.  Place clean sheets on your bed the night of your first shower and do not  sleep with pets. Day of Surgery : Do not apply any lotions/deodorants the morning of surgery.  Please wear clean clothes to the hospital/surgery center.  FAILURE  TO FOLLOW THESE INSTRUCTIONS MAY RESULT IN THE CANCELLATION OF YOUR SURGERY PATIENT SIGNATURE_________________________________  NURSE  SIGNATURE__________________________________  ________________________________________________________________________

## 2020-10-01 ENCOUNTER — Encounter (HOSPITAL_COMMUNITY)
Admission: RE | Admit: 2020-10-01 | Discharge: 2020-10-01 | Disposition: A | Discharge status: Home / Self Care | Payer: Medicare Other | Source: Ambulatory Visit | Attending: Podiatry | Admitting: Podiatry

## 2020-10-01 ENCOUNTER — Other Ambulatory Visit: Payer: Self-pay

## 2020-10-01 ENCOUNTER — Ambulatory Visit (HOSPITAL_COMMUNITY)
Admission: RE | Admit: 2020-10-01 | Discharge: 2020-10-01 | Disposition: A | Discharge status: Home / Self Care | Payer: Medicare Other | Source: Ambulatory Visit | Attending: Vascular Surgery | Admitting: Vascular Surgery

## 2020-10-01 ENCOUNTER — Encounter (HOSPITAL_COMMUNITY): Payer: Self-pay

## 2020-10-01 ENCOUNTER — Telehealth: Payer: Self-pay | Admitting: Podiatry

## 2020-10-01 DIAGNOSIS — I739 Peripheral vascular disease, unspecified: Secondary | ICD-10-CM | POA: Diagnosis not present

## 2020-10-01 DIAGNOSIS — L97522 Non-pressure chronic ulcer of other part of left foot with fat layer exposed: Secondary | ICD-10-CM | POA: Insufficient documentation

## 2020-10-01 LAB — CBC
HCT: 27 % — ABNORMAL LOW (ref 36.0–46.0)
Hemoglobin: 8.8 g/dL — ABNORMAL LOW (ref 12.0–15.0)
MCH: 31.4 pg (ref 26.0–34.0)
MCHC: 32.6 g/dL (ref 30.0–36.0)
MCV: 96.4 fL (ref 80.0–100.0)
Platelets: 231 10*3/uL (ref 150–400)
RBC: 2.8 MIL/uL — ABNORMAL LOW (ref 3.87–5.11)
RDW: 16.8 % — ABNORMAL HIGH (ref 11.5–15.5)
WBC: 5.6 10*3/uL (ref 4.0–10.5)
nRBC: 0 % (ref 0.0–0.2)

## 2020-10-01 LAB — GLUCOSE, CAPILLARY: Glucose-Capillary: 98 mg/dL (ref 70–99)

## 2020-10-03 ENCOUNTER — Other Ambulatory Visit: Payer: Self-pay | Admitting: Nurse Practitioner

## 2020-10-05 ENCOUNTER — Encounter: Payer: Self-pay | Admitting: Vascular Surgery

## 2020-10-05 ENCOUNTER — Telehealth: Payer: Self-pay | Admitting: *Deleted

## 2020-10-05 ENCOUNTER — Other Ambulatory Visit: Payer: Self-pay

## 2020-10-05 ENCOUNTER — Ambulatory Visit: Payer: Self-pay | Admitting: *Deleted

## 2020-10-05 ENCOUNTER — Ambulatory Visit (HOSPITAL_COMMUNITY)
Admission: RE | Admit: 2020-10-05 | Discharge: 2020-10-05 | Disposition: A | Discharge status: Home / Self Care | Payer: Medicare Other | Source: Ambulatory Visit | Attending: Student | Admitting: Student

## 2020-10-05 ENCOUNTER — Ambulatory Visit (INDEPENDENT_AMBULATORY_CARE_PROVIDER_SITE_OTHER): Payer: Medicare Other | Admitting: Vascular Surgery

## 2020-10-05 ENCOUNTER — Other Ambulatory Visit (HOSPITAL_COMMUNITY): Payer: Self-pay | Admitting: Student

## 2020-10-05 ENCOUNTER — Encounter (HOSPITAL_COMMUNITY): Payer: Self-pay | Admitting: Emergency Medicine

## 2020-10-05 VITALS — BP 142/63 | HR 78 | Temp 98.3°F | Resp 16 | Ht 66.0 in | Wt 127.0 lb

## 2020-10-05 DIAGNOSIS — Z431 Encounter for attention to gastrostomy: Secondary | ICD-10-CM | POA: Diagnosis not present

## 2020-10-05 DIAGNOSIS — R633 Feeding difficulties, unspecified: Secondary | ICD-10-CM | POA: Diagnosis not present

## 2020-10-05 DIAGNOSIS — I4819 Other persistent atrial fibrillation: Secondary | ICD-10-CM

## 2020-10-05 DIAGNOSIS — I70245 Atherosclerosis of native arteries of left leg with ulceration of other part of foot: Secondary | ICD-10-CM

## 2020-10-05 DIAGNOSIS — K9413 Enterostomy malfunction: Secondary | ICD-10-CM | POA: Diagnosis not present

## 2020-10-05 HISTORY — PX: IR GJ TUBE CHANGE: IMG1440

## 2020-10-05 MED ORDER — OXYCODONE HCL 5 MG PO TABS: 2.5000 mg | ORAL_TABLET | Freq: Three times a day (TID) | ORAL | 0 refills | Status: DC | PRN

## 2020-10-05 MED ORDER — IOHEXOL 300 MG/ML  SOLN
40.0000 mL | Freq: Once | INTRAMUSCULAR | Status: AC | PRN
  Administered 2020-10-05: 15 mL

## 2020-10-05 NOTE — Telephone Encounter (Signed)
Spoke to staff from Dr. Mora Appl office. Staff member stated that pt is in the process to be scheduled for an angiogram on 7/13 and typically pt needs to hold warfarin 3 days prior to procedure. Requested that they fax over clearance to [phone]

## 2020-10-05 NOTE — H&P (View-Only) (Signed)
[facility] [place]  ASSESSMENT / PLAN: Shannon [name] is a 87 y.o. female with atherosclerosis of native arteries of bilateral lower extremities causing ulceration.  Patient counseled patients with chronic limb threatening ischemia have an annual risk of cardiovascular mortality of 25% and a annual amputation risk of 25%. Aggressive risk factor modification and intervention for limb salvage is indicated..  Recommend the following which can slow the progression of atherosclerosis and reduce the risk of major adverse cardiac / limb events:  Complete cessation from all tobacco products. Blood glucose control with goal A1c < 7%. Blood pressure control with goal blood pressure < 140/90 mmHg. Lipid reduction therapy with goal LDL-C <100 mg/dL (<95 if symptomatic from PAD).  Aspirin 81mg  PO QD.  Atorvastatin 40-80mg  PO QD (or other "high intensity" statin therapy).  Plan left lower extremity angiogram with possible intervention via right common femoral artery approach in cath lab 10/13/20. Will need to delay TMA until revascularization can be done.  CHIEF COMPLAINT: Ulceration about the left great toe  HISTORY OF PRESENT ILLNESS: Shannon [name] is a 87 y.o. female known to our practice who is referred from her podiatrist for evaluation of ulceration about her left great toe.  The patient is ambulatory, and able to get around her house.  She is fairly independent.  She has no ischemic rest pain or claudication-like symptoms in her lower extremities, however.  She had critical limb ischemia of her right lower extremity in the past which was treated endovascularly by our group.  While she healed a transmetatarsal amputation on that side she now has 2 areas of ulceration on the plantar aspect of her right foot.  VASCULAR SURGICAL HISTORY:  09/21/16 - left superficial femoral artery angioplasty 09/25/16 - left second and third toe amputation 06/27/17 - right femoral-popliteal  angioplasty, tibioperoneal trunk angioplasty 05/03/18 - right great toe amputation  Past Medical History:  Diagnosis Date   Acute respiratory failure (HCC)    Anemia    previous blood transfusions   Arthritis    "all over"   Asthma    Atrial flutter (HCC)    Bradycardia    requiring previous d/c of BB and reduction of amiodarone   CAD (coronary artery disease)    nonobstructive per notes   Chronic diastolic CHF (congestive heart failure) (HCC)    CKD (chronic kidney disease), stage III (HCC)    Complication of blood transfusion    "got the wrong blood type at [facility] in ~ 2015; no adverse reaction that we are aware of"/daughter, [name] (01/27/2016)   COPD (chronic obstructive pulmonary disease) (HCC)    Depression    "light case"   DVT (deep venous thrombosis) (HCC) 01/2016   a. LLE DVT 01/2016 - switched from Eliquis to Coumadin.   Dyspnea    with some activity   Gastric stenosis    a. s/p stomach tube   GERD (gastroesophageal reflux disease)    Headache    History of 2019 novel coronavirus disease (COVID-19)    History of blood transfusion    "several" (01/27/2016)   History of stomach ulcers    Hyperlipidemia    Hypertension    Hypothyroidism    On home oxygen therapy    "2L; 9PM til 9AM" (06/27/2017)   PAD (peripheral artery disease) (HCC)    a. prior gangrene, toe amputations, intervention.   PAF (paroxysmal atrial fibrillation) (HCC)    Paraesophageal hernia    Perforated gastric ulcer (HCC)  Pneumonia    "a few times" (06/27/2017)   Seasonal allergies    SIADH (syndrome of inappropriate ADH production) (HCC)    [name] 01/10/2015   Small bowel obstruction (HCC)    "I don't know how many" (01/11/2015)   Stroke [facility])    several- no residual   Type II diabetes mellitus (HCC)    "related to prednisone use  for > 20 yr; once predinose stopped; no more DM RX" (01/27/2016)   UTI (urinary tract infection) 02/08/2016   Ventral hernia with bowel obstruction      Past Surgical History:  Procedure Laterality Date   ABDOMINAL AORTOGRAM N/A 09/21/2016   Procedure: Abdominal Aortogram;  Surgeon: Maeola Harman, MD;  Location: [facility] INVASIVE CV LAB;  Service: Cardiovascular;  Laterality: N/A;   AMPUTATION Left 09/25/2016   Procedure: AMPUTATION DIGIT- LEFT 2ND AND 3RD TOES;  Surgeon: Larina Earthly, MD;  Location: [facility] OR;  Service: Vascular;  Laterality: Left;   AMPUTATION Right 05/03/2018   Procedure: AMPUTATION RIGHT GREAT TOE;  Surgeon: Larina Earthly, MD;  Location: [facility] OR;  Service: Vascular;  Laterality: Right;   CATARACT EXTRACTION W/ INTRAOCULAR LENS  IMPLANT, BILATERAL     CHOLECYSTECTOMY OPEN     COLECTOMY     ESOPHAGOGASTRODUODENOSCOPY N/A 01/19/2014   Procedure: ESOPHAGOGASTRODUODENOSCOPY (EGD);  Surgeon: Hilarie Fredrickson, MD;  Location: [facility];  Service: Endoscopy;  Laterality: N/A;   ESOPHAGOGASTRODUODENOSCOPY N/A 01/20/2014   Procedure: ESOPHAGOGASTRODUODENOSCOPY (EGD);  Surgeon: Hilarie Fredrickson, MD;  Location: [facility];  Service: Endoscopy;  Laterality: N/A;   ESOPHAGOGASTRODUODENOSCOPY N/A 03/19/2014   Procedure: ESOPHAGOGASTRODUODENOSCOPY (EGD);  Surgeon: Rachael Fee, MD;  Location: [facility];  Service: Endoscopy;  Laterality: N/A;   ESOPHAGOGASTRODUODENOSCOPY N/A 07/08/2015   Procedure: ESOPHAGOGASTRODUODENOSCOPY (EGD);  Surgeon: Sherrilyn Rist, MD;  Location: [facility] ENDOSCOPY;  Service: Endoscopy;  Laterality: N/A;   ESOPHAGOGASTRODUODENOSCOPY (EGD) WITH PROPOFOL N/A 09/15/2015   Procedure: ESOPHAGOGASTRODUODENOSCOPY (EGD) WITH PROPOFOL;  Surgeon: Ruffin Frederick, MD;  Location: [facility] ENDOSCOPY;  Service: Gastroenterology;  Laterality: N/A;   GASTROJEJUNOSTOMY     hx/notes 01/10/2015   GASTROJEJUNOSTOMY N/A 09/23/2015   Procedure: OPEN GASTROJEJUNOSTOMY TUBE PLACEMENT;  Surgeon: De Blanch Kinsinger, MD;  Location: [facility] ORS;  Service: General;  Laterality: N/A;   GLAUCOMA SURGERY Bilateral    HERNIA REPAIR  2015   IR CM  INJ ANY COLONIC TUBE W/FLUORO  01/05/2017   IR CM INJ ANY COLONIC TUBE W/FLUORO  06/07/2017   IR CM INJ ANY COLONIC TUBE W/FLUORO  11/05/2017   IR CM INJ ANY COLONIC TUBE W/FLUORO  09/18/2018   IR CM INJ ANY COLONIC TUBE W/FLUORO  03/06/2019   IR CM INJ ANY COLONIC TUBE W/FLUORO  03/11/2019   IR GASTR TUBE CONVERT GASTR-JEJ PER W/FL MOD SED  04/30/2020   IR GENERIC HISTORICAL  01/07/2016   IR GJ TUBE CHANGE 01/07/2016 Malachy Moan, MD [facility]   IR GENERIC HISTORICAL  01/27/2016   IR MECH REMOV OBSTRUC MAT ANY COLON TUBE W/FLUORO 01/27/2016 Richarda Overlie, MD [facility]   IR GENERIC HISTORICAL  02/07/2016   IR PATIENT EVAL TECH 0-60 MINS Darrell K Allred, PA-C [facility]   IR GENERIC HISTORICAL  02/08/2016   IR GJ TUBE CHANGE 02/08/2016 Berdine Dance, MD [facility]   IR GENERIC HISTORICAL  01/06/2016   IR GJ TUBE CHANGE 01/06/2016 [facility] OUT REF   IR GENERIC HISTORICAL  05/02/2016   IR CM INJ ANY COLONIC TUBE W/FLUORO 05/02/2016 Oley Balm,  MD [facility]   IR GENERIC HISTORICAL  05/15/2016   IR GJ TUBE CHANGE 05/15/2016 Simonne Come, MD [facility]   IR GENERIC HISTORICAL  06/28/2016   IR GJ TUBE CHANGE 06/28/2016 [facility]   IR GJ TUBE CHANGE  02/20/2017   IR GJ TUBE CHANGE  05/10/2017   IR GJ TUBE CHANGE  07/06/2017   IR GJ TUBE CHANGE  08/02/2017   IR GJ TUBE CHANGE  10/15/2017   IR GJ TUBE CHANGE  12/26/2017   IR GJ TUBE CHANGE  04/08/2018   IR GJ TUBE CHANGE  06/19/2018   IR GJ TUBE CHANGE  03/03/2019   IR GJ TUBE CHANGE  06/06/2019   IR GJ TUBE CHANGE  09/26/2019   IR GJ TUBE CHANGE  01/09/2020   IR GJ TUBE CHANGE  05/12/2020   IR PATIENT EVAL TECH 0-60 MINS  10/19/2016   IR PATIENT EVAL TECH 0-60 MINS  12/25/2016   IR PATIENT EVAL TECH 0-60 MINS  01/29/2017   IR PATIENT EVAL TECH 0-60 MINS  04/04/2017   IR PATIENT EVAL TECH 0-60 MINS  04/30/2017   IR PATIENT EVAL TECH 0-60 MINS  07/31/2017   IR REPLC DUODEN/JEJUNO TUBE PERCUT W/FLUORO  11/14/2016   LAPAROTOMY N/A 01/20/2015   Procedure:  EXPLORATORY LAPAROTOMY;  Surgeon: Abigail Miyamoto, MD;  Location[facility];  Service: General;  Laterality: N/A;   LOWER EXTREMITY ANGIOGRAPHY Left 09/21/2016   Procedure: Lower Extremity Angiography;  Surgeon: Maeola Harman, MD;  Location: [facility] INVASIVE CV LAB;  Service: Cardiovascular;  Laterality: Left;   LOWER EXTREMITY ANGIOGRAPHY Right 06/27/2017   Procedure: Lower Extremity Angiography;  Surgeon: Maeola Harman, MD;  Location: [facility] INVASIVE CV LAB;  Service: Cardiovascular;  Laterality: Right;   LYSIS OF ADHESION N/A 01/20/2015   Procedure: LYSIS OF ADHESIONS < 1 HOUR;  Surgeon: Abigail Miyamoto, MD;  Location[facility];  Service: General;  Laterality: N/A;   PERIPHERAL VASCULAR BALLOON ANGIOPLASTY Left 09/21/2016   Procedure: Peripheral Vascular Balloon Angioplasty;  Surgeon: Maeola Harman, MD;  Location: [facility] INVASIVE CV LAB;  Service: Cardiovascular;  Laterality: Left;  drug coated balloon   PERIPHERAL VASCULAR BALLOON ANGIOPLASTY Right 06/27/2017   Procedure: PERIPHERAL VASCULAR BALLOON ANGIOPLASTY;  Surgeon: Maeola Harman, MD;  Location: [facility] INVASIVE CV LAB;  Service: Cardiovascular;  Laterality: Right;  SFA/TPTRUNK   TONSILLECTOMY     TRANSMETATARSAL AMPUTATION Right 11/14/2019   Procedure: TRANSMETATARSAL AMPUTATION RIGHT FOOT;  Surgeon: Felecia Shelling, DPM;  Location: [facility];  Service: Podiatry;  Laterality: Right;   TUBAL LIGATION     VENTRAL HERNIA REPAIR  2015   incarcerated ventral hernia ([facility] 09/2013)/notes 01/10/2015    Family History  Problem Relation Age of Onset   Stroke Mother    Hypertension Mother    Diabetes Brother    Glaucoma Son    Glaucoma Son    Heart attack Neg Hx    Colon cancer Neg Hx    Stomach cancer Neg Hx     Social History   Socioeconomic History   Marital status: Widowed    Spouse name: Not on file   Number of children: 3   Years of education: Not on file   Highest education level: Not on file  Occupational  History   Not on file  Tobacco Use   Smoking status: Never   Smokeless tobacco: Former    Types: Snuff    Quit date: 04/03/1980   Tobacco comments:    "used  snuff in my younger days"  Vaping Use   Vaping Use: Never used  Substance and Sexual Activity   Alcohol use: Never    Alcohol/week: 0.0 standard drinks   Drug use: Never   Sexual activity: Not Currently  Other Topics Concern   Not on file  Social History Narrative   Married in 1947, lives in one story house with 2 people and no pets   Occupation: ?   Has a living Will, [place], and doesn't have DNR   Was living with Husband (in frail health) in [place] until 09/2013.  After her [facility] admission they both came to live with daughter in [place]   Social Determinants of Health   Financial Resource Strain: Not on file  Food Insecurity: Not on file  Transportation Needs: Not on file  Physical Activity: Not on file  Stress: Not on file  Social Connections: Not on file  Intimate Partner Violence: Not on file    Allergies  Allergen Reactions   Penicillins Itching, Rash and Other (See Comments)    Tolerated amoxicillin, unasyn, zosyn & cephalosporins in the past. Did it involve swelling of the face/tongue/throat, SOB, or low BP? No Did it involve sudden or severe rash/hives, skin peeling, or any reaction on the inside of your mouth or nose? No Did you need to seek medical attention at a hospital or doctor's office? Unknown When did it last happen?      5+ years If all above answers are "NO", may proceed with cephalosporin use.     Current Outpatient Medications  Medication Sig Dispense Refill   acetaminophen (TYLENOL) 500 MG tablet Take 500 mg by mouth every 6 (six) hours as needed for moderate pain.     amLODipine (NORVASC) 5 MG tablet Take 1 tablet (5 mg total) by mouth daily. 30 tablet 11   atorvastatin (LIPITOR) 10 MG tablet TAKE 1 TABLET BY MOUTH EVERY DAY (Patient taking differently: Take 10 mg by mouth daily.) 90  tablet 0   b complex vitamins tablet Take 1 tablet by mouth daily.      benzocaine (AMERICAINE) 20 % rectal ointment Place rectally every 3 (three) hours as needed for pain. (Patient not taking: Reported on 09/28/2020) 28.4 g 0   cholecalciferol (VITAMIN D3) 25 MCG (1000 UNIT) tablet Take 1,000 Units by mouth daily.     cycloSPORINE (RESTASIS) 0.05 % ophthalmic emulsion USE 1 DROP INTO BOTH EYES TWICE DAILY (Patient taking differently: Place 1 drop into both eyes 2 (two) times daily.) 60 mL 3   DULERA 200-5 MCG/ACT AERO TAKE TWO PUFFS BY MOUTH TWICE DAILY (Patient taking differently: Inhale 2 puffs into the lungs in the morning and at bedtime. TAKE TWO PUFFS BY MOUTH TWICE DAILY) 13 each 5   DULoxetine (CYMBALTA) 30 MG capsule TAKE 1 CAPSULE BY MOUTH EVERY DAY (Patient taking differently: Take 30 mg by mouth daily.) 90 capsule 2   ferrous sulfate 220 (44 Fe) MG/5ML solution TAKE 7.4 MLS (325 MG TOTAL) BY MOUTH DAILY. 150 mL 11   fluticasone (FLONASE) 50 MCG/ACT nasal spray SPRAY 2 SPRAYS INTO EACH NOSTRIL EVERY DAY (Patient taking differently: Place 2 sprays into both nostrils in the morning and at bedtime.) 48 mL 1   furosemide (LASIX) 40 MG tablet TAKE 0.5-1 TABLETS (20-40 MG TOTAL) BY MOUTH SEE ADMIN INSTRUCTIONS. TAKE 20 MG DAILY, MAY INSTEAD TAKE 40 MG DAILY AS NEEDED FOR EDEMA 30 tablet 8   gabapentin (NEURONTIN) 300 MG capsule Take 1 capsule (300  mg total) by mouth 2 (two) times daily. 180 capsule 1   gentamicin cream (GARAMYCIN) 0.1 % Apply 1 application topically daily as needed (dry skin). (Patient not taking: Reported on 09/28/2020) 30 g 1   hydrALAZINE (APRESOLINE) 50 MG tablet TAKE 1 TABLET BY MOUTH THREE TIMES A DAY (Patient taking differently: Take 50 mg by mouth 3 (three) times daily.) 270 tablet 2   insulin aspart (NOVOLOG) 100 UNIT/ML FlexPen Inject 2-6 Units into the skin as needed for high blood sugar. New sliding Scale If BS is 151 - 251 = 2 units,  If BS is 251 -  350 = 4 units,  If  BS is 351 - 450 = 6 units Call provider if BS runs more than 400     Insulin Pen Needle (B-D UF III MINI PEN NEEDLES) 31G X 5 MM MISC Use twice daily with giving insulin injections. Dx E11.9 300 each 3   ipratropium-albuterol (DUONEB) 0.5-2.5 (3) MG/3ML SOLN Take 3 mLs by nebulization every 6 (six) hours as needed (shortness of breath).     isosorbide mononitrate (IMDUR) 30 MG 24 hr tablet TAKE 2 TABLETS BY MOUTH DAILY. (Patient taking differently: Take 60 mg by mouth in the morning.) 180 tablet 2   LANTUS SOLOSTAR 100 UNIT/ML Solostar Pen INJECT 8 UNITS INTO THE SKIN AT BEDTIME. (Patient taking differently: Inject 8 Units into the skin at bedtime as needed (blood sugar 150 or above).) 3 mL 3   latanoprost (XALATAN) 0.005 % ophthalmic solution Place 1 drop into both eyes at bedtime.     levalbuterol (XOPENEX HFA) 45 MCG/ACT inhaler Inhale 2 puffs into the lungs every 6 (six) hours as needed for wheezing.     levothyroxine (SYNTHROID) 125 MCG tablet TAKE 1 TABLET BY MOUTH EVERY DAY (Patient taking differently: Take 125 mcg by mouth daily before breakfast.) 90 tablet 0   loratadine (CLARITIN) 10 MG tablet Take 10 mg by mouth daily.     Multiple Vitamin (MULTIVITAMIN WITH MINERALS) TABS tablet Take 1 tablet by mouth daily.     Nutritional Supplements (FEEDING SUPPLEMENT, OSMOLITE 1.5 CAL,) LIQD Place 711 mLs into feeding tube See admin instructions. Take 711 ml over a 12 hour period per day 9 am-9 pm at a rate of 65     nystatin (MYCOSTATIN) 100000 UNIT/ML suspension Take 5 mLs (500,000 Units total) by mouth 4 (four) times daily. (Patient taking differently: Take 5 mLs by mouth 4 (four) times daily as needed (thrush).) 473 mL 0   nystatin (MYCOSTATIN/NYSTOP) powder Apply 1 application topically as needed (skin irritation). 15 g 0   nystatin cream (MYCOSTATIN) APPLY TO AFFECTED AREA TWICE A DAY (Patient taking differently: Apply 1 application topically at bedtime.) 30 g 11   oxyCODONE (ROXICODONE) 5 MG  immediate release tablet Take 1 tablet (5 mg total) by mouth every 4 (four) hours as needed for severe pain. May take 1/2 tab q4hrs as needed for mild pain. 30 tablet 0   pantoprazole (PROTONIX) 40 MG tablet Take 1 tablet (40 mg total) by mouth daily. (Patient taking differently: Take 40 mg by mouth daily with supper.) 90 tablet 2   Polyethyl Glycol-Propyl Glycol (SYSTANE) 0.4-0.3 % GEL ophthalmic gel Place 1 application into both eyes 2 (two) times daily as needed (moisture).     Probiotic Product (EQL PROBIOTIC COLON SUPPORT PO) Take 1 capsule by mouth daily.     senna-docusate (SENOKOT-S) 8.6-50 MG tablet Take 1 tablet by mouth as needed for mild constipation.  warfarin (COUMADIN) 2.5 MG tablet TAKE 2-3 TABLETS BY MOUTH DAILY AS DIRECTED BY COUMADIN CLINIC. (Patient taking differently: Take 5-7.5 mg by mouth See admin instructions. Take 5 mg by mouth on Monday, Tuesday, Wednesday, Friday and Satuday, take 7.5 mg on Sunday and Thursday) 215 tablet 1   Water For Irrigation, Sterile (FREE WATER) SOLN Place 180 mLs into feeding tube 3 (three) times daily.  (Patient not taking: Reported on 09/28/2020)     White Petrolatum-Mineral Oil (SYSTANE NIGHTTIME) OINT Place 1 application into both eyes at bedtime.      No current facility-administered medications for this visit.    REVIEW OF SYSTEMS:  [X]  denotes positive finding, [ ]  denotes negative finding Cardiac  Comments:  Chest pain or chest pressure:    Shortness of breath upon exertion:    Short of breath when lying flat:    Irregular heart rhythm:        Vascular    Pain in calf, thigh, or hip brought on by ambulation:    Pain in feet at night that wakes you up from your sleep:     Blood clot in your veins:    Leg swelling:         Pulmonary    Oxygen at home:    Productive cough:     Wheezing:         Neurologic    Sudden weakness in arms or legs:     Sudden numbness in arms or legs:     Sudden onset of difficulty speaking or  slurred speech:    Temporary loss of vision in one eye:     Problems with dizziness:         Gastrointestinal    Blood in stool:     Vomited blood:         Genitourinary    Burning when urinating:     Blood in urine:        Psychiatric    Major depression:         Hematologic    Bleeding problems:    Problems with blood clotting too easily:        Skin    Rashes or ulcers:        Constitutional    Fever or chills:      PHYSICAL EXAM Vitals:   10/05/20 1418  BP: (!) 142/63  Pulse: 78  Resp: 16  Temp: 98.3 F (36.8 C)  SpO2: 96%  Weight: 127 lb (57.6 kg)  Height: 5\' 6"  (1.676 m)    Constitutional: Elderly appearing. No distress. Appears poorly nourished.  Neurologic: CN intact. no focal findings. no sensory loss. Psychiatric:  Mood and affect symmetric and appropriate. Eyes:  No icterus. No conjunctival pallor. Ears, nose, throat:  mucous membranes moist. Midline trachea.  Cardiac: regular rate and rhythm.  Respiratory:  unlabored. Abdominal:  soft, non-tender, non-distended.  Peripheral vascular:   2+ R popliteal pulse  Absent L popliteal pulse  Absent pedal pulses bilaterally Extremity: No edema. No cyanosis. No pallor.  Skin:      Lymphatic: no Stemmer's sign. no palpable lymphadenopathy.  PERTINENT LABORATORY AND RADIOLOGIC DATA  Most recent CBC CBC Latest Ref Rng & Units 10/01/2020 08/27/2020 05/07/2020  WBC 4.0 - 10.5 K/uL 5.6 6.3 5.7  Hemoglobin 12.0 - 15.0 g/dL 4.7(W) 2.9(F) 10.5(L)  Hematocrit 36.0 - 46.0 % 27.0(L) 28.4(L) 32.2(L)  Platelets 150 - 400 K/uL 231 220 225     Most recent CMP CMP Latest  Ref Rng & Units 09/23/2020 08/27/2020 06/02/2020  Glucose 65 - 139 mg/dL 528 413(K) 440(N)  BUN 7 - 25 mg/dL 02(V) 25(D) 66(Y)  Creatinine 0.60 - 0.88 mg/dL 4.03(K) 7.42(V) 9.56(L)  Sodium 135 - 146 mmol/L 137 132(L) 134(L)  Potassium 3.5 - 5.3 mmol/L 5.1 4.7 4.5  Chloride 98 - 110 mmol/L 103 98 99  CO2 20 - 32 mmol/L 26 26 26   Calcium 8.6 - 10.4  mg/dL 9.1 9.2 9.4  Total Protein 6.1 - 8.1 g/dL - - -  Total Bilirubin 0.2 - 1.2 mg/dL - - -  Alkaline Phos 38 - 126 U/L - - -  AST 10 - 35 U/L - - -  ALT 6 - 29 U/L - - -    Renal function Estimated Creatinine Clearance: 18.8 mL/min (A) (by C-G formula based on SCr of 1.63 mg/dL (H)).  Hgb A1c MFr Bld (% of total Hgb)  Date Value  09/23/2020 6.2 (H)    LDL Cholesterol (Calc)  Date Value Ref Range Status  10/15/2018 58 mg/dL (calc) Final    Comment:    Reference range: <100 . Desirable range <100 mg/dL for primary prevention;   <70 mg/dL for patients with CHD or diabetic patients  with > or = 2 CHD risk factors. [name] LDL-C is now calculated using the Martin-Hopkins  calculation, which is a validated novel method providing  better accuracy than the Friedewald equation in the  estimation of LDL-C.  Horald Pollen et al. Lenox Ahr. 8756;433(29): 2061-2068  (http://education.[facility].com/faq/FAQ164)     ABI 09/10/20   Arterial duplex 10/01/20 LOWER EXTREMITY ARTERIAL DUPLEX STUDY   Patient Name:  Shannon [name]  Date of Exam:   10/01/2020  Medical Rec #: [id number]      Accession #:    [id number]  Date of Birth: [birth year]      Patient Gender: F  Patient Age:   89Y  Exam Location:  [facility]  Procedure:      VAS [place] LOWER EXTREMITY ARTERIAL DUPLEX  Referring Phys: [id number] Rande Brunt Erich Kochan    ---------------------------------------------------------------------------  -----     Indications: Pre op left transmetatarsal amputation.   High Risk Factors: Hypertension.     Current ABI: 09/10/20 Left: Noncompressible with monophasic waveforms   Comparison Study: none   Performing Technologist: Jeb Levering RDMS, RVT      Examination Guidelines: A complete evaluation includes B-mode imaging,  spectral  Doppler, color Doppler, and power Doppler as needed of all accessible  portions  of each vessel. Bilateral testing is considered an integral part of a   complete  examination. Limited examinations for reoccurring indications may be  performed  as noted.            +-----------+--------+-----+--------+----------+--------+  LEFT       PSV cm/sRatioStenosisWaveform  Comments  +-----------+--------+-----+--------+----------+--------+  CFA Distal 151                  biphasic            +-----------+--------+-----+--------+----------+--------+  DFA        98                   biphasic            +-----------+--------+-----+--------+----------+--------+  SFA Prox   101                  biphasic            +-----------+--------+-----+--------+----------+--------+  SFA  Mid    94                   biphasic            +-----------+--------+-----+--------+----------+--------+  SFA Distal 125                  biphasic            +-----------+--------+-----+--------+----------+--------+  POP Prox   111                  biphasic            +-----------+--------+-----+--------+----------+--------+  POP Distal 87                   monophasic          +-----------+--------+-----+--------+----------+--------+  ATA Mid                 occluded                    +-----------+--------+-----+--------+----------+--------+  ATA Distal              occluded                    +-----------+--------+-----+--------+----------+--------+  PTA Distal 84                   monophasic          +-----------+--------+-----+--------+----------+--------+  PERO Distal55                   monophasic          +-----------+--------+-----+--------+----------+--------+   Rande Brunt. Lenell Antu, MD [facility] Phone Number: [phone] 10/05/2020 9:09 AM

## 2020-10-05 NOTE — Telephone Encounter (Signed)
Pt's daughter called and stated that pt is to have surgery on 7/8 and would not be able to come to appointment on 7/8.  She asked when pt was to start holding warfarin. Informed her that I did not see a clearance note and she would need to call Dr. Rebekah [place] office to see when she is suppose to hold warfarin and if needs to hold warfarin a clearance note should be sent to our office.  Daughter stated that she would give Dr. Amalia Hailey a call and will call Coumadin clinic back. Informed daughter that I would cancel appoint for 7/8 and will reschedule after she talks to Dr. Amalia Hailey.

## 2020-10-05 NOTE — Telephone Encounter (Signed)
Message left on clinical intake voicemail:   Patient needs a refill on Oxycodne.  RX was last refilled on 08/10/2020, no treatment agreement on file, notation made on pending appt

## 2020-10-05 NOTE — Telephone Encounter (Signed)
Daughter called and stated that the procedure for 7/8 was cancelled and she is suppose to have another procedure on 7/13. Daughter asked when pt was to hold warfarin. Informed daughter that the Dr should be sending over a clearance form to let us know when/if to hold her warfarin. Rescheduled pt to come in on 7/8 to have INR checked.   Called Dr. Mora Appl office [facility] for the pre-procedure staff to fax clearance form or to give coumadin clinic a call at [phone].

## 2020-10-05 NOTE — Progress Notes (Signed)
[facility] [place]  ASSESSMENT / PLAN: Shannon [name] is a 87 y.o. female with atherosclerosis of native arteries of bilateral lower extremities causing ulceration.  Patient counseled patients with chronic limb threatening ischemia have an annual risk of cardiovascular mortality of 25% and a annual amputation risk of 25%. Aggressive risk factor modification and intervention for limb salvage is indicated..  Recommend the following which can slow the progression of atherosclerosis and reduce the risk of major adverse cardiac / limb events:  Complete cessation from all tobacco products. Blood glucose control with goal A1c < 7%. Blood pressure control with goal blood pressure < 140/90 mmHg. Lipid reduction therapy with goal LDL-C <100 mg/dL (<95 if symptomatic from PAD).  Aspirin 81mg  PO QD.  Atorvastatin 40-80mg  PO QD (or other "high intensity" statin therapy).  Plan left lower extremity angiogram with possible intervention via right common femoral artery approach in cath lab 10/13/20. Will need to delay TMA until revascularization can be done.  CHIEF COMPLAINT: Ulceration about the left great toe  HISTORY OF PRESENT ILLNESS: Shannon [name] is a 87 y.o. female known to our practice who is referred from her podiatrist for evaluation of ulceration about her left great toe.  The patient is ambulatory, and able to get around her house.  She is fairly independent.  She has no ischemic rest pain or claudication-like symptoms in her lower extremities, however.  She had critical limb ischemia of her right lower extremity in the past which was treated endovascularly by our group.  While she healed a transmetatarsal amputation on that side she now has 2 areas of ulceration on the plantar aspect of her right foot.  VASCULAR SURGICAL HISTORY:  09/21/16 - left superficial femoral artery angioplasty 09/25/16 - left second and third toe amputation 06/27/17 - right femoral-popliteal  angioplasty, tibioperoneal trunk angioplasty 05/03/18 - right great toe amputation  Past Medical History:  Diagnosis Date   Acute respiratory failure (HCC)    Anemia    previous blood transfusions   Arthritis    "all over"   Asthma    Atrial flutter (HCC)    Bradycardia    requiring previous d/c of BB and reduction of amiodarone   CAD (coronary artery disease)    nonobstructive per notes   Chronic diastolic CHF (congestive heart failure) (HCC)    CKD (chronic kidney disease), stage III (HCC)    Complication of blood transfusion    "got the wrong blood type at [facility] in ~ 2015; no adverse reaction that we are aware of"/daughter, [name] (01/27/2016)   COPD (chronic obstructive pulmonary disease) (HCC)    Depression    "light case"   DVT (deep venous thrombosis) (HCC) 01/2016   a. LLE DVT 01/2016 - switched from Eliquis to Coumadin.   Dyspnea    with some activity   Gastric stenosis    a. s/p stomach tube   GERD (gastroesophageal reflux disease)    Headache    History of 2019 novel coronavirus disease (COVID-19)    History of blood transfusion    "several" (01/27/2016)   History of stomach ulcers    Hyperlipidemia    Hypertension    Hypothyroidism    On home oxygen therapy    "2L; 9PM til 9AM" (06/27/2017)   PAD (peripheral artery disease) (HCC)    a. prior gangrene, toe amputations, intervention.   PAF (paroxysmal atrial fibrillation) (HCC)    Paraesophageal hernia    Perforated gastric ulcer (HCC)  Pneumonia    "a few times" (06/27/2017)   Seasonal allergies    SIADH (syndrome of inappropriate ADH production) (HCC)    [name] 01/10/2015   Small bowel obstruction (HCC)    "I don't know how many" (01/11/2015)   Stroke [facility])    several- no residual   Type II diabetes mellitus (HCC)    "related to prednisone use  for > 20 yr; once predinose stopped; no more DM RX" (01/27/2016)   UTI (urinary tract infection) 02/08/2016   Ventral hernia with bowel obstruction      Past Surgical History:  Procedure Laterality Date   ABDOMINAL AORTOGRAM N/A 09/21/2016   Procedure: Abdominal Aortogram;  Surgeon: Maeola Harman, MD;  Location: [facility] INVASIVE CV LAB;  Service: Cardiovascular;  Laterality: N/A;   AMPUTATION Left 09/25/2016   Procedure: AMPUTATION DIGIT- LEFT 2ND AND 3RD TOES;  Surgeon: Larina Earthly, MD;  Location: [facility] OR;  Service: Vascular;  Laterality: Left;   AMPUTATION Right 05/03/2018   Procedure: AMPUTATION RIGHT GREAT TOE;  Surgeon: Larina Earthly, MD;  Location: [facility] OR;  Service: Vascular;  Laterality: Right;   CATARACT EXTRACTION W/ INTRAOCULAR LENS  IMPLANT, BILATERAL     CHOLECYSTECTOMY OPEN     COLECTOMY     ESOPHAGOGASTRODUODENOSCOPY N/A 01/19/2014   Procedure: ESOPHAGOGASTRODUODENOSCOPY (EGD);  Surgeon: Hilarie Fredrickson, MD;  Location: [facility]COPY;  Service: Endoscopy;  Laterality: N/A;   ESOPHAGOGASTRODUODENOSCOPY N/A 01/20/2014   Procedure: ESOPHAGOGASTRODUODENOSCOPY (EGD);  Surgeon: Hilarie Fredrickson, MD;  Location: [facility];  Service: Endoscopy;  Laterality: N/A;   ESOPHAGOGASTRODUODENOSCOPY N/A 03/19/2014   Procedure: ESOPHAGOGASTRODUODENOSCOPY (EGD);  Surgeon: Rachael Fee, MD;  Location: [facility];  Service: Endoscopy;  Laterality: N/A;   ESOPHAGOGASTRODUODENOSCOPY N/A 07/08/2015   Procedure: ESOPHAGOGASTRODUODENOSCOPY (EGD);  Surgeon: Sherrilyn Rist, MD;  Location: [facility] ENDOSCOPY;  Service: Endoscopy;  Laterality: N/A;   ESOPHAGOGASTRODUODENOSCOPY (EGD) WITH PROPOFOL N/A 09/15/2015   Procedure: ESOPHAGOGASTRODUODENOSCOPY (EGD) WITH PROPOFOL;  Surgeon: Ruffin Frederick, MD;  Location: [facility] ENDOSCOPY;  Service: Gastroenterology;  Laterality: N/A;   GASTROJEJUNOSTOMY     hx/notes 01/10/2015   GASTROJEJUNOSTOMY N/A 09/23/2015   Procedure: OPEN GASTROJEJUNOSTOMY TUBE PLACEMENT;  Surgeon: De Blanch Kinsinger, MD;  Location: [facility] ORS;  Service: General;  Laterality: N/A;   GLAUCOMA SURGERY Bilateral    HERNIA REPAIR  2015   IR CM  INJ ANY COLONIC TUBE W/FLUORO  01/05/2017   IR CM INJ ANY COLONIC TUBE W/FLUORO  06/07/2017   IR CM INJ ANY COLONIC TUBE W/FLUORO  11/05/2017   IR CM INJ ANY COLONIC TUBE W/FLUORO  09/18/2018   IR CM INJ ANY COLONIC TUBE W/FLUORO  03/06/2019   IR CM INJ ANY COLONIC TUBE W/FLUORO  03/11/2019   IR GASTR TUBE CONVERT GASTR-JEJ PER W/FL MOD SED  04/30/2020   IR GENERIC HISTORICAL  01/07/2016   IR GJ TUBE CHANGE 01/07/2016 Malachy Moan, MD [facility]   IR GENERIC HISTORICAL  01/27/2016   IR MECH REMOV OBSTRUC MAT ANY COLON TUBE W/FLUORO 01/27/2016 Richarda Overlie, MD [facility]   IR GENERIC HISTORICAL  02/07/2016   IR PATIENT EVAL TECH 0-60 MINS Darrell K Allred, PA-C [facility]   IR GENERIC HISTORICAL  02/08/2016   IR GJ TUBE CHANGE 02/08/2016 Berdine Dance, MD [facility]   IR GENERIC HISTORICAL  01/06/2016   IR GJ TUBE CHANGE 01/06/2016 [facility] OUT REF   IR GENERIC HISTORICAL  05/02/2016   IR CM INJ ANY COLONIC TUBE W/FLUORO 05/02/2016 Oley Balm,  MD [facility]   IR GENERIC HISTORICAL  05/15/2016   IR GJ TUBE CHANGE 05/15/2016 Simonne Come, MD [facility]   IR GENERIC HISTORICAL  06/28/2016   IR GJ TUBE CHANGE 06/28/2016 [facility]   IR GJ TUBE CHANGE  02/20/2017   IR GJ TUBE CHANGE  05/10/2017   IR GJ TUBE CHANGE  07/06/2017   IR GJ TUBE CHANGE  08/02/2017   IR GJ TUBE CHANGE  10/15/2017   IR GJ TUBE CHANGE  12/26/2017   IR GJ TUBE CHANGE  04/08/2018   IR GJ TUBE CHANGE  06/19/2018   IR GJ TUBE CHANGE  03/03/2019   IR GJ TUBE CHANGE  06/06/2019   IR GJ TUBE CHANGE  09/26/2019   IR GJ TUBE CHANGE  01/09/2020   IR GJ TUBE CHANGE  05/12/2020   IR PATIENT EVAL TECH 0-60 MINS  10/19/2016   IR PATIENT EVAL TECH 0-60 MINS  12/25/2016   IR PATIENT EVAL TECH 0-60 MINS  01/29/2017   IR PATIENT EVAL TECH 0-60 MINS  04/04/2017   IR PATIENT EVAL TECH 0-60 MINS  04/30/2017   IR PATIENT EVAL TECH 0-60 MINS  07/31/2017   IR REPLC DUODEN/JEJUNO TUBE PERCUT W/FLUORO  11/14/2016   LAPAROTOMY N/A 01/20/2015   Procedure:  EXPLORATORY LAPAROTOMY;  Surgeon: Abigail Miyamoto, MD;  [facility];  Service: General;  Laterality: N/A;   LOWER EXTREMITY ANGIOGRAPHY Left 09/21/2016   Procedure: Lower Extremity Angiography;  Surgeon: Maeola Harman, MD;  Location: [facility] INVASIVE CV LAB;  Service: Cardiovascular;  Laterality: Left;   LOWER EXTREMITY ANGIOGRAPHY Right 06/27/2017   Procedure: Lower Extremity Angiography;  Surgeon: Maeola Harman, MD;  Location: [facility] INVASIVE CV LAB;  Service: Cardiovascular;  Laterality: Right;   LYSIS OF ADHESION N/A 01/20/2015   Procedure: LYSIS OF ADHESIONS < 1 HOUR;  Surgeon: Abigail Miyamoto, MD;  Location[facility];  Service: General;  Laterality: N/A;   PERIPHERAL VASCULAR BALLOON ANGIOPLASTY Left 09/21/2016   Procedure: Peripheral Vascular Balloon Angioplasty;  Surgeon: Maeola Harman, MD;  Location: [facility] INVASIVE CV LAB;  Service: Cardiovascular;  Laterality: Left;  drug coated balloon   PERIPHERAL VASCULAR BALLOON ANGIOPLASTY Right 06/27/2017   Procedure: PERIPHERAL VASCULAR BALLOON ANGIOPLASTY;  Surgeon: Maeola Harman, MD;  Location: [facility] INVASIVE CV LAB;  Service: Cardiovascular;  Laterality: Right;  SFA/TPTRUNK   TONSILLECTOMY     TRANSMETATARSAL AMPUTATION Right 11/14/2019   Procedure: TRANSMETATARSAL AMPUTATION RIGHT FOOT;  Surgeon: Felecia Shelling, DPM;  Location[facility];  Service: Podiatry;  Laterality: Right;   TUBAL LIGATION     VENTRAL HERNIA REPAIR  2015   incarcerated ventral hernia ([facility] 09/2013)/notes 01/10/2015    Family History  Problem Relation Age of Onset   Stroke Mother    Hypertension Mother    Diabetes Brother    Glaucoma Son    Glaucoma Son    Heart attack Neg Hx    Colon cancer Neg Hx    Stomach cancer Neg Hx     Social History   Socioeconomic History   Marital status: Widowed    Spouse name: Not on file   Number of children: 3   Years of education: Not on file   Highest education level: Not on file  Occupational  History   Not on file  Tobacco Use   Smoking status: Never   Smokeless tobacco: Former    Types: Snuff    Quit date: 04/03/1980   Tobacco comments:    "used  snuff in my younger days"  Vaping Use   Vaping Use: Never used  Substance and Sexual Activity   Alcohol use: Never    Alcohol/week: 0.0 standard drinks   Drug use: Never   Sexual activity: Not Currently  Other Topics Concern   Not on file  Social History Narrative   Married in 1947, lives in one story house with 2 people and no pets   Occupation: ?   Has a living Will, [place], and doesn't have DNR   Was living with Husband (in frail health) in [place] until 09/2013.  After her [facility] admission they both came to live with daughter in [place]   Social Determinants of Health   Financial Resource Strain: Not on file  Food Insecurity: Not on file  Transportation Needs: Not on file  Physical Activity: Not on file  Stress: Not on file  Social Connections: Not on file  Intimate Partner Violence: Not on file    Allergies  Allergen Reactions   Penicillins Itching, Rash and Other (See Comments)    Tolerated amoxicillin, unasyn, zosyn & cephalosporins in the past. Did it involve swelling of the face/tongue/throat, SOB, or low BP? No Did it involve sudden or severe rash/hives, skin peeling, or any reaction on the inside of your mouth or nose? No Did you need to seek medical attention at a hospital or doctor's office? Unknown When did it last happen?      5+ years If all above answers are "NO", may proceed with cephalosporin use.     Current Outpatient Medications  Medication Sig Dispense Refill   acetaminophen (TYLENOL) 500 MG tablet Take 500 mg by mouth every 6 (six) hours as needed for moderate pain.     amLODipine (NORVASC) 5 MG tablet Take 1 tablet (5 mg total) by mouth daily. 30 tablet 11   atorvastatin (LIPITOR) 10 MG tablet TAKE 1 TABLET BY MOUTH EVERY DAY (Patient taking differently: Take 10 mg by mouth daily.) 90  tablet 0   b complex vitamins tablet Take 1 tablet by mouth daily.      benzocaine (AMERICAINE) 20 % rectal ointment Place rectally every 3 (three) hours as needed for pain. (Patient not taking: Reported on 09/28/2020) 28.4 g 0   cholecalciferol (VITAMIN D3) 25 MCG (1000 UNIT) tablet Take 1,000 Units by mouth daily.     cycloSPORINE (RESTASIS) 0.05 % ophthalmic emulsion USE 1 DROP INTO BOTH EYES TWICE DAILY (Patient taking differently: Place 1 drop into both eyes 2 (two) times daily.) 60 mL 3   DULERA 200-5 MCG/ACT AERO TAKE TWO PUFFS BY MOUTH TWICE DAILY (Patient taking differently: Inhale 2 puffs into the lungs in the morning and at bedtime. TAKE TWO PUFFS BY MOUTH TWICE DAILY) 13 each 5   DULoxetine (CYMBALTA) 30 MG capsule TAKE 1 CAPSULE BY MOUTH EVERY DAY (Patient taking differently: Take 30 mg by mouth daily.) 90 capsule 2   ferrous sulfate 220 (44 Fe) MG/5ML solution TAKE 7.4 MLS (325 MG TOTAL) BY MOUTH DAILY. 150 mL 11   fluticasone (FLONASE) 50 MCG/ACT nasal spray SPRAY 2 SPRAYS INTO EACH NOSTRIL EVERY DAY (Patient taking differently: Place 2 sprays into both nostrils in the morning and at bedtime.) 48 mL 1   furosemide (LASIX) 40 MG tablet TAKE 0.5-1 TABLETS (20-40 MG TOTAL) BY MOUTH SEE ADMIN INSTRUCTIONS. TAKE 20 MG DAILY, MAY INSTEAD TAKE 40 MG DAILY AS NEEDED FOR EDEMA 30 tablet 8   gabapentin (NEURONTIN) 300 MG capsule Take 1 capsule (300  mg total) by mouth 2 (two) times daily. 180 capsule 1   gentamicin cream (GARAMYCIN) 0.1 % Apply 1 application topically daily as needed (dry skin). (Patient not taking: Reported on 09/28/2020) 30 g 1   hydrALAZINE (APRESOLINE) 50 MG tablet TAKE 1 TABLET BY MOUTH THREE TIMES A DAY (Patient taking differently: Take 50 mg by mouth 3 (three) times daily.) 270 tablet 2   insulin aspart (NOVOLOG) 100 UNIT/ML FlexPen Inject 2-6 Units into the skin as needed for high blood sugar. New sliding Scale If BS is 151 - 251 = 2 units,  If BS is 251 -  350 = 4 units,  If  BS is 351 - 450 = 6 units Call provider if BS runs more than 400     Insulin Pen Needle (B-D UF III MINI PEN NEEDLES) 31G X 5 MM MISC Use twice daily with giving insulin injections. Dx E11.9 300 each 3   ipratropium-albuterol (DUONEB) 0.5-2.5 (3) MG/3ML SOLN Take 3 mLs by nebulization every 6 (six) hours as needed (shortness of breath).     isosorbide mononitrate (IMDUR) 30 MG 24 hr tablet TAKE 2 TABLETS BY MOUTH DAILY. (Patient taking differently: Take 60 mg by mouth in the morning.) 180 tablet 2   LANTUS SOLOSTAR 100 UNIT/ML Solostar Pen INJECT 8 UNITS INTO THE SKIN AT BEDTIME. (Patient taking differently: Inject 8 Units into the skin at bedtime as needed (blood sugar 150 or above).) 3 mL 3   latanoprost (XALATAN) 0.005 % ophthalmic solution Place 1 drop into both eyes at bedtime.     levalbuterol (XOPENEX HFA) 45 MCG/ACT inhaler Inhale 2 puffs into the lungs every 6 (six) hours as needed for wheezing.     levothyroxine (SYNTHROID) 125 MCG tablet TAKE 1 TABLET BY MOUTH EVERY DAY (Patient taking differently: Take 125 mcg by mouth daily before breakfast.) 90 tablet 0   loratadine (CLARITIN) 10 MG tablet Take 10 mg by mouth daily.     Multiple Vitamin (MULTIVITAMIN WITH MINERALS) TABS tablet Take 1 tablet by mouth daily.     Nutritional Supplements (FEEDING SUPPLEMENT, OSMOLITE 1.5 CAL,) LIQD Place 711 mLs into feeding tube See admin instructions. Take 711 ml over a 12 hour period per day 9 am-9 pm at a rate of 65     nystatin (MYCOSTATIN) 100000 UNIT/ML suspension Take 5 mLs (500,000 Units total) by mouth 4 (four) times daily. (Patient taking differently: Take 5 mLs by mouth 4 (four) times daily as needed (thrush).) 473 mL 0   nystatin (MYCOSTATIN/NYSTOP) powder Apply 1 application topically as needed (skin irritation). 15 g 0   nystatin cream (MYCOSTATIN) APPLY TO AFFECTED AREA TWICE A DAY (Patient taking differently: Apply 1 application topically at bedtime.) 30 g 11   oxyCODONE (ROXICODONE) 5 MG  immediate release tablet Take 1 tablet (5 mg total) by mouth every 4 (four) hours as needed for severe pain. May take 1/2 tab q4hrs as needed for mild pain. 30 tablet 0   pantoprazole (PROTONIX) 40 MG tablet Take 1 tablet (40 mg total) by mouth daily. (Patient taking differently: Take 40 mg by mouth daily with supper.) 90 tablet 2   Polyethyl Glycol-Propyl Glycol (SYSTANE) 0.4-0.3 % GEL ophthalmic gel Place 1 application into both eyes 2 (two) times daily as needed (moisture).     Probiotic Product (EQL PROBIOTIC COLON SUPPORT PO) Take 1 capsule by mouth daily.     senna-docusate (SENOKOT-S) 8.6-50 MG tablet Take 1 tablet by mouth as needed for mild constipation.  warfarin (COUMADIN) 2.5 MG tablet TAKE 2-3 TABLETS BY MOUTH DAILY AS DIRECTED BY COUMADIN CLINIC. (Patient taking differently: Take 5-7.5 mg by mouth See admin instructions. Take 5 mg by mouth on Monday, Tuesday, Wednesday, Friday and Satuday, take 7.5 mg on Sunday and Thursday) 215 tablet 1   Water For Irrigation, Sterile (FREE WATER) SOLN Place 180 mLs into feeding tube 3 (three) times daily.  (Patient not taking: Reported on 09/28/2020)     White Petrolatum-Mineral Oil (SYSTANE NIGHTTIME) OINT Place 1 application into both eyes at bedtime.      No current facility-administered medications for this visit.    REVIEW OF SYSTEMS:  [X]  denotes positive finding, [ ]  denotes negative finding Cardiac  Comments:  Chest pain or chest pressure:    Shortness of breath upon exertion:    Short of breath when lying flat:    Irregular heart rhythm:        Vascular    Pain in calf, thigh, or hip brought on by ambulation:    Pain in feet at night that wakes you up from your sleep:     Blood clot in your veins:    Leg swelling:         Pulmonary    Oxygen at home:    Productive cough:     Wheezing:         Neurologic    Sudden weakness in arms or legs:     Sudden numbness in arms or legs:     Sudden onset of difficulty speaking or  slurred speech:    Temporary loss of vision in one eye:     Problems with dizziness:         Gastrointestinal    Blood in stool:     Vomited blood:         Genitourinary    Burning when urinating:     Blood in urine:        Psychiatric    Major depression:         Hematologic    Bleeding problems:    Problems with blood clotting too easily:        Skin    Rashes or ulcers:        Constitutional    Fever or chills:      PHYSICAL EXAM Vitals:   10/05/20 1418  BP: (!) 142/63  Pulse: 78  Resp: 16  Temp: 98.3 F (36.8 C)  SpO2: 96%  Weight: 127 lb (57.6 kg)  Height: 5\' 6"  (1.676 m)    Constitutional: Elderly appearing. No distress. Appears poorly nourished.  Neurologic: CN intact. no focal findings. no sensory loss. Psychiatric:  Mood and affect symmetric and appropriate. Eyes:  No icterus. No conjunctival pallor. Ears, nose, throat:  mucous membranes moist. Midline trachea.  Cardiac: regular rate and rhythm.  Respiratory:  unlabored. Abdominal:  soft, non-tender, non-distended.  Peripheral vascular:   2+ R popliteal pulse  Absent L popliteal pulse  Absent pedal pulses bilaterally Extremity: No edema. No cyanosis. No pallor.  Skin:      Lymphatic: no Stemmer's sign. no palpable lymphadenopathy.  PERTINENT LABORATORY AND RADIOLOGIC DATA  Most recent CBC CBC Latest Ref Rng & Units 10/01/2020 08/27/2020 05/07/2020  WBC 4.0 - 10.5 K/uL 5.6 6.3 5.7  Hemoglobin 12.0 - 15.0 g/dL 4.7(W) 2.9(F) 10.5(L)  Hematocrit 36.0 - 46.0 % 27.0(L) 28.4(L) 32.2(L)  Platelets 150 - 400 K/uL 231 220 225     Most recent CMP CMP Latest  Ref Rng & Units 09/23/2020 08/27/2020 06/02/2020  Glucose 65 - 139 mg/dL 528 413(K) 440(N)  BUN 7 - 25 mg/dL 02(V) 25(D) 66(Y)  Creatinine 0.60 - 0.88 mg/dL 4.03(K) 7.42(V) 9.56(L)  Sodium 135 - 146 mmol/L 137 132(L) 134(L)  Potassium 3.5 - 5.3 mmol/L 5.1 4.7 4.5  Chloride 98 - 110 mmol/L 103 98 99  CO2 20 - 32 mmol/L 26 26 26   Calcium 8.6 - 10.4  mg/dL 9.1 9.2 9.4  Total Protein 6.1 - 8.1 g/dL - - -  Total Bilirubin 0.2 - 1.2 mg/dL - - -  Alkaline Phos 38 - 126 U/L - - -  AST 10 - 35 U/L - - -  ALT 6 - 29 U/L - - -    Renal function Estimated Creatinine Clearance: 18.8 mL/min (A) (by C-G formula based on SCr of 1.63 mg/dL (H)).  Hgb A1c MFr Bld (% of total Hgb)  Date Value  09/23/2020 6.2 (H)    LDL Cholesterol (Calc)  Date Value Ref Range Status  10/15/2018 58 mg/dL (calc) Final    Comment:    Reference range: <100 . Desirable range <100 mg/dL for primary prevention;   <70 mg/dL for patients with CHD or diabetic patients  with > or = 2 CHD risk factors. [name] LDL-C is now calculated using the Martin-Hopkins  calculation, which is a validated novel method providing  better accuracy than the Friedewald equation in the  estimation of LDL-C.  Horald Pollen et al. Lenox Ahr. 8756;433(29): 2061-2068  (http://education.[facility].com/faq/FAQ164)     ABI 09/10/20   Arterial duplex 10/01/20 LOWER EXTREMITY ARTERIAL DUPLEX STUDY   Patient Name:  Shannon [name]  Date of Exam:   10/01/2020  Medical Rec #: [id number]      Accession #:    [id number]  Date of Birth: [birth year]      Patient Gender: F  Patient Age:   89Y  Exam Location:  [facility]  Procedure:      VAS [place] LOWER EXTREMITY ARTERIAL DUPLEX  Referring Phys: [id number] Rande Brunt Erich Kochan    ---------------------------------------------------------------------------  -----     Indications: Pre op left transmetatarsal amputation.   High Risk Factors: Hypertension.     Current ABI: 09/10/20 Left: Noncompressible with monophasic waveforms   Comparison Study: none   Performing Technologist: Jeb Levering RDMS, RVT      Examination Guidelines: A complete evaluation includes B-mode imaging,  spectral  Doppler, color Doppler, and power Doppler as needed of all accessible  portions  of each vessel. Bilateral testing is considered an integral part of a   complete  examination. Limited examinations for reoccurring indications may be  performed  as noted.            +-----------+--------+-----+--------+----------+--------+  LEFT       PSV cm/sRatioStenosisWaveform  Comments  +-----------+--------+-----+--------+----------+--------+  CFA Distal 151                  biphasic            +-----------+--------+-----+--------+----------+--------+  DFA        98                   biphasic            +-----------+--------+-----+--------+----------+--------+  SFA Prox   101                  biphasic            +-----------+--------+-----+--------+----------+--------+  SFA  Mid    94                   biphasic            +-----------+--------+-----+--------+----------+--------+  SFA Distal 125                  biphasic            +-----------+--------+-----+--------+----------+--------+  POP Prox   111                  biphasic            +-----------+--------+-----+--------+----------+--------+  POP Distal 87                   monophasic          +-----------+--------+-----+--------+----------+--------+  ATA Mid                 occluded                    +-----------+--------+-----+--------+----------+--------+  ATA Distal              occluded                    +-----------+--------+-----+--------+----------+--------+  PTA Distal 84                   monophasic          +-----------+--------+-----+--------+----------+--------+  PERO Distal55                   monophasic          +-----------+--------+-----+--------+----------+--------+   Rande Brunt. Lenell Antu, MD [facility] Phone Number: [phone] 10/05/2020 9:09 AM

## 2020-10-06 ENCOUNTER — Other Ambulatory Visit: Payer: Self-pay

## 2020-10-06 ENCOUNTER — Other Ambulatory Visit (HOSPITAL_COMMUNITY): Payer: Medicare Other

## 2020-10-08 ENCOUNTER — Telehealth: Payer: Self-pay | Admitting: Pharmacist

## 2020-10-08 ENCOUNTER — Ambulatory Visit (HOSPITAL_COMMUNITY): Admission: RE | Admit: 2020-10-08 | Payer: Medicare Other | Source: Home / Self Care | Admitting: Podiatry

## 2020-10-08 ENCOUNTER — Encounter (HOSPITAL_COMMUNITY): Admission: RE | Payer: Self-pay | Source: Home / Self Care

## 2020-10-08 ENCOUNTER — Other Ambulatory Visit: Payer: Self-pay

## 2020-10-08 ENCOUNTER — Other Ambulatory Visit: Payer: Medicare Other

## 2020-10-08 ENCOUNTER — Ambulatory Visit (INDEPENDENT_AMBULATORY_CARE_PROVIDER_SITE_OTHER): Payer: Medicare Other | Admitting: Pharmacist

## 2020-10-08 DIAGNOSIS — Z5181 Encounter for therapeutic drug level monitoring: Secondary | ICD-10-CM

## 2020-10-08 DIAGNOSIS — I4892 Unspecified atrial flutter: Secondary | ICD-10-CM | POA: Diagnosis not present

## 2020-10-08 DIAGNOSIS — Z20822 Contact with and (suspected) exposure to covid-19: Secondary | ICD-10-CM | POA: Diagnosis not present

## 2020-10-08 DIAGNOSIS — I4891 Unspecified atrial fibrillation: Secondary | ICD-10-CM

## 2020-10-08 DIAGNOSIS — I4819 Other persistent atrial fibrillation: Secondary | ICD-10-CM | POA: Diagnosis not present

## 2020-10-08 LAB — PROTIME-INR
INR: 6.7 (ref 0.9–1.2)
Prothrombin Time: 62.2 s — ABNORMAL HIGH (ref 9.1–12.0)

## 2020-10-08 LAB — POCT INR
INR: 7.5 — AB (ref 2.0–3.0)
INR: 6.7 — AB (ref 2.0–3.0)

## 2020-10-08 SURGERY — AMPUTATION, TOE
Anesthesia: Choice | Site: Toe | Laterality: Left

## 2020-10-08 NOTE — Telephone Encounter (Signed)
Stat lab came back at 6.8 from [facility] stat lab. Called and spoke with daughter. Recommended holding warfarin tonight, tomorrow, Sunday and Monday and will return Tuesday for INR and CBC.  Orders have been placed.

## 2020-10-08 NOTE — Telephone Encounter (Addendum)
Name: Shannon [name]  DOB: 01/26/1924  MRN: [id number]   Primary Cardiologist: Tobias Alexander, MD (Inactive) - will transfer care to Dr. Shari Prows  Chart reviewed as part of pre-operative protocol coverage. Patient's chart was routed to pre-op box for cardiac clearance review given that patient has upcoming PV angiogram scheduled by vascular surgery on 10/13/20. Cardiac clearance was not specifically requested by the vascular team but Coumadin clinic asked for review. She was seen in Coumadin clinic today for routine INR follow-up at which time it was identified that this procedure was pending (left lower extremity angiogram with possible intervention via right common femoral artery approach in cath lab 10/13/20). From clearance standpoint, she has not had any recent cardioversion or ablation which would prohibit interruption of anticoagulation for this procedure. Her advanced age and multiple comorbidities put her at increased risk of procedural complications but do not suspect there would be any cardiac testing that would specifically helpful prior to an angiogram to ameliorate her risk.   Complicating the issue, however, is that her INR was found to be 7.5.  Per discussion with Laural Golden, pharmD, he instructed the patient not to take any Coumadin tonight, tomorrow or Sunday. They sent off a lab confirmation and Thayer Ohm said he plans to review the result today and he will call the patient to advise on how to handle anticoagulation in the days leading up to surgery. He and Malena Peer reviewed the case with Dr. Shari Prows - initially it was felt the patient would require a 3 day warfarin hold with no bridge. However, per further discussion with Thayer Ohm and Dr. Shari Prows, given the elevated INR, the patient may require holding from now until procedure date due to supratherapeutic INR. The Coumadin clinic will coordinate further monitoring of her INR and recommendations going forward. Per Thayer Ohm they plan  to recheck her INR early next week prior to procedure date.  It also appears she had a Hgb checked as part of preadmissions testing with Hgb of 8.8. She has had varying degrees of anemia going back the last several years ranging primarily in the mid 8 to mid 10 range. The patient had denied any signs/symptoms of bleeding at her Coumadin visit today and Thayer Ohm reports they did review watching for these. I spoke with Dr. Shari Prows with our team who recommended to recheck a CBC when she gets her INR repeated. Thayer Ohm is aware to add on to her INR check next week. He also provided an update via phone to Dr. Verita Lamb office.  No additional input needed from the preop APP box at this time, will remove from box.  Laurann Montana, PA-C 10/08/2020, 3:01 PM

## 2020-10-08 NOTE — Progress Notes (Signed)
Shannon [name] spoke with Dr. Johney Frame who recommended 3 day warfarin hold with no Lovenox bridge.   INR in room today 7.5.  Patient sent to lab for confirmation.    Patient with diagnosis of A Fib on warfarin for anticoagulation.    Procedure: left lower extremity angiogram with possible intervention via right common femoral artery approach in cath lab 10/13/20 Date of procedure: 10/13/20   CHA2DS2-VASc Score = 9  This indicates a 12.2% annual risk of stroke. The patient's score is based upon: CHF History: Yes HTN History: Yes Diabetes History: Yes Stroke History: Yes Vascular Disease History: Yes Age Score: 2 Gender Score: 1   CrCl 16 mL/min Platelet count 231K

## 2020-10-08 NOTE — Telephone Encounter (Signed)
Melissa spoke with Dr. Johney Frame who recommended 3 day warfarin hold with no Lovenox bridge.   INR in room today 7.5.  Patient sent to lab for confirmation.     Patient with diagnosis of A Fib on warfarin for anticoagulation.     Procedure: left lower extremity angiogram with possible intervention via right common femoral artery approach in cath lab 10/13/20  Date of procedure: 10/13/20     CHA2DS2-VASc Score = 9  This indicates a 12.2% annual risk of stroke. The patient's score is based upon: CHF History: Yes HTN History: Yes Diabetes History: Yes Stroke History: Yes Vascular Disease History: Yes Age Score: 2 Gender Score: 1    CrCl 16 mL/min Platelet count 231K

## 2020-10-08 NOTE — Telephone Encounter (Signed)
Spoke with Stanton Kidney at [facility] and let her know POCT INR 7.5.  Still awaiting lab INR.  Stanton Kidney said she will let Dr Stanford Breed know.

## 2020-10-08 NOTE — Patient Instructions (Signed)
Description   Called and spoke with daughter.  Patient to hold warfarin tonight, Saturday, Sunday, and Monday.  Recheck INR/CBC (per Melina Copa and Gwyndolyn Kaufman) on Tuesday 7/12

## 2020-10-10 ENCOUNTER — Emergency Department (HOSPITAL_COMMUNITY)
Admission: EM | Admit: 2020-10-10 | Discharge: 2020-10-10 | Disposition: A | Discharge status: Home / Self Care | Payer: Medicare Other | Source: Home / Self Care | Attending: Emergency Medicine | Admitting: Emergency Medicine

## 2020-10-10 ENCOUNTER — Inpatient Hospital Stay (HOSPITAL_COMMUNITY)
Admission: EM | Admit: 2020-10-10 | Discharge: 2020-10-14 | DRG: 368 | Disposition: A | Discharge status: Home / Self Care | Payer: Medicare Other | Attending: Internal Medicine | Admitting: Internal Medicine

## 2020-10-10 ENCOUNTER — Encounter (HOSPITAL_COMMUNITY): Payer: Self-pay | Admitting: Emergency Medicine

## 2020-10-10 ENCOUNTER — Other Ambulatory Visit: Payer: Self-pay

## 2020-10-10 DIAGNOSIS — D62 Acute posthemorrhagic anemia: Secondary | ICD-10-CM

## 2020-10-10 DIAGNOSIS — Z20822 Contact with and (suspected) exposure to covid-19: Secondary | ICD-10-CM | POA: Diagnosis present

## 2020-10-10 DIAGNOSIS — E43 Unspecified severe protein-calorie malnutrition: Secondary | ICD-10-CM | POA: Diagnosis not present

## 2020-10-10 DIAGNOSIS — E1142 Type 2 diabetes mellitus with diabetic polyneuropathy: Secondary | ICD-10-CM | POA: Insufficient documentation

## 2020-10-10 DIAGNOSIS — Z823 Family history of stroke: Secondary | ICD-10-CM

## 2020-10-10 DIAGNOSIS — J45909 Unspecified asthma, uncomplicated: Secondary | ICD-10-CM | POA: Insufficient documentation

## 2020-10-10 DIAGNOSIS — E1122 Type 2 diabetes mellitus with diabetic chronic kidney disease: Secondary | ICD-10-CM | POA: Diagnosis present

## 2020-10-10 DIAGNOSIS — Z87891 Personal history of nicotine dependence: Secondary | ICD-10-CM

## 2020-10-10 DIAGNOSIS — I959 Hypotension, unspecified: Secondary | ICD-10-CM | POA: Diagnosis not present

## 2020-10-10 DIAGNOSIS — N184 Chronic kidney disease, stage 4 (severe): Secondary | ICD-10-CM | POA: Insufficient documentation

## 2020-10-10 DIAGNOSIS — Z961 Presence of intraocular lens: Secondary | ICD-10-CM | POA: Diagnosis present

## 2020-10-10 DIAGNOSIS — Z794 Long term (current) use of insulin: Secondary | ICD-10-CM | POA: Insufficient documentation

## 2020-10-10 DIAGNOSIS — Z7901 Long term (current) use of anticoagulants: Secondary | ICD-10-CM

## 2020-10-10 DIAGNOSIS — E1151 Type 2 diabetes mellitus with diabetic peripheral angiopathy without gangrene: Secondary | ICD-10-CM | POA: Diagnosis present

## 2020-10-10 DIAGNOSIS — Z7952 Long term (current) use of systemic steroids: Secondary | ICD-10-CM | POA: Insufficient documentation

## 2020-10-10 DIAGNOSIS — K921 Melena: Secondary | ICD-10-CM | POA: Diagnosis not present

## 2020-10-10 DIAGNOSIS — K219 Gastro-esophageal reflux disease without esophagitis: Secondary | ICD-10-CM | POA: Diagnosis present

## 2020-10-10 DIAGNOSIS — N183 Chronic kidney disease, stage 3 unspecified: Secondary | ICD-10-CM | POA: Diagnosis present

## 2020-10-10 DIAGNOSIS — N1831 Chronic kidney disease, stage 3a: Secondary | ICD-10-CM | POA: Diagnosis present

## 2020-10-10 DIAGNOSIS — I5032 Chronic diastolic (congestive) heart failure: Secondary | ICD-10-CM | POA: Diagnosis present

## 2020-10-10 DIAGNOSIS — Z9842 Cataract extraction status, left eye: Secondary | ICD-10-CM

## 2020-10-10 DIAGNOSIS — L7601 Intraoperative hemorrhage and hematoma of skin and subcutaneous tissue complicating a dermatologic procedure: Secondary | ICD-10-CM | POA: Diagnosis not present

## 2020-10-10 DIAGNOSIS — Z79899 Other long term (current) drug therapy: Secondary | ICD-10-CM | POA: Insufficient documentation

## 2020-10-10 DIAGNOSIS — I130 Hypertensive heart and chronic kidney disease with heart failure and stage 1 through stage 4 chronic kidney disease, or unspecified chronic kidney disease: Secondary | ICD-10-CM | POA: Diagnosis present

## 2020-10-10 DIAGNOSIS — K922 Gastrointestinal hemorrhage, unspecified: Secondary | ICD-10-CM | POA: Diagnosis present

## 2020-10-10 DIAGNOSIS — I4892 Unspecified atrial flutter: Secondary | ICD-10-CM | POA: Diagnosis present

## 2020-10-10 DIAGNOSIS — I10 Essential (primary) hypertension: Secondary | ICD-10-CM | POA: Diagnosis present

## 2020-10-10 DIAGNOSIS — D500 Iron deficiency anemia secondary to blood loss (chronic): Secondary | ICD-10-CM | POA: Diagnosis not present

## 2020-10-10 DIAGNOSIS — I2510 Atherosclerotic heart disease of native coronary artery without angina pectoris: Secondary | ICD-10-CM | POA: Diagnosis present

## 2020-10-10 DIAGNOSIS — Z9841 Cataract extraction status, right eye: Secondary | ICD-10-CM

## 2020-10-10 DIAGNOSIS — Z86718 Personal history of other venous thrombosis and embolism: Secondary | ICD-10-CM

## 2020-10-10 DIAGNOSIS — I480 Paroxysmal atrial fibrillation: Secondary | ICD-10-CM | POA: Insufficient documentation

## 2020-10-10 DIAGNOSIS — K2101 Gastro-esophageal reflux disease with esophagitis, with bleeding: Principal | ICD-10-CM | POA: Diagnosis present

## 2020-10-10 DIAGNOSIS — Z8616 Personal history of COVID-19: Secondary | ICD-10-CM

## 2020-10-10 DIAGNOSIS — K311 Adult hypertrophic pyloric stenosis: Secondary | ICD-10-CM | POA: Diagnosis present

## 2020-10-10 DIAGNOSIS — D6859 Other primary thrombophilia: Secondary | ICD-10-CM | POA: Diagnosis present

## 2020-10-10 DIAGNOSIS — E039 Hypothyroidism, unspecified: Secondary | ICD-10-CM | POA: Diagnosis present

## 2020-10-10 DIAGNOSIS — Z89422 Acquired absence of other left toe(s): Secondary | ICD-10-CM

## 2020-10-10 DIAGNOSIS — R233 Spontaneous ecchymoses: Secondary | ICD-10-CM

## 2020-10-10 DIAGNOSIS — I4819 Other persistent atrial fibrillation: Secondary | ICD-10-CM | POA: Diagnosis present

## 2020-10-10 DIAGNOSIS — Z8673 Personal history of transient ischemic attack (TIA), and cerebral infarction without residual deficits: Secondary | ICD-10-CM

## 2020-10-10 DIAGNOSIS — Z880 Allergy status to penicillin: Secondary | ICD-10-CM

## 2020-10-10 DIAGNOSIS — Z66 Do not resuscitate: Secondary | ICD-10-CM | POA: Diagnosis not present

## 2020-10-10 DIAGNOSIS — Z833 Family history of diabetes mellitus: Secondary | ICD-10-CM

## 2020-10-10 DIAGNOSIS — R790 Abnormal level of blood mineral: Secondary | ICD-10-CM | POA: Diagnosis not present

## 2020-10-10 DIAGNOSIS — R64 Cachexia: Secondary | ICD-10-CM | POA: Diagnosis not present

## 2020-10-10 DIAGNOSIS — Z89411 Acquired absence of right great toe: Secondary | ICD-10-CM

## 2020-10-10 DIAGNOSIS — K9421 Gastrostomy hemorrhage: Secondary | ICD-10-CM | POA: Diagnosis not present

## 2020-10-10 DIAGNOSIS — R7989 Other specified abnormal findings of blood chemistry: Secondary | ICD-10-CM | POA: Insufficient documentation

## 2020-10-10 DIAGNOSIS — R791 Abnormal coagulation profile: Secondary | ICD-10-CM

## 2020-10-10 DIAGNOSIS — J449 Chronic obstructive pulmonary disease, unspecified: Secondary | ICD-10-CM | POA: Diagnosis present

## 2020-10-10 DIAGNOSIS — Z7902 Long term (current) use of antithrombotics/antiplatelets: Secondary | ICD-10-CM | POA: Insufficient documentation

## 2020-10-10 DIAGNOSIS — I739 Peripheral vascular disease, unspecified: Secondary | ICD-10-CM | POA: Diagnosis present

## 2020-10-10 DIAGNOSIS — E785 Hyperlipidemia, unspecified: Secondary | ICD-10-CM | POA: Diagnosis present

## 2020-10-10 DIAGNOSIS — K222 Esophageal obstruction: Secondary | ICD-10-CM | POA: Diagnosis present

## 2020-10-10 DIAGNOSIS — F32A Depression, unspecified: Secondary | ICD-10-CM | POA: Diagnosis present

## 2020-10-10 DIAGNOSIS — R58 Hemorrhage, not elsewhere classified: Secondary | ICD-10-CM | POA: Insufficient documentation

## 2020-10-10 DIAGNOSIS — Z8249 Family history of ischemic heart disease and other diseases of the circulatory system: Secondary | ICD-10-CM

## 2020-10-10 DIAGNOSIS — Z681 Body mass index (BMI) 19 or less, adult: Secondary | ICD-10-CM

## 2020-10-10 DIAGNOSIS — K317 Polyp of stomach and duodenum: Secondary | ICD-10-CM | POA: Diagnosis present

## 2020-10-10 LAB — CBC WITH DIFFERENTIAL/PLATELET
Abs Immature Granulocytes: 0.02 10*3/uL (ref 0.00–0.07)
Basophils Absolute: 0 10*3/uL (ref 0.0–0.1)
Basophils Relative: 1 %
Eosinophils Absolute: 0.3 10*3/uL (ref 0.0–0.5)
Eosinophils Relative: 5 %
HCT: 21.2 % — ABNORMAL LOW (ref 36.0–46.0)
Hemoglobin: 6.9 g/dL — CL (ref 12.0–15.0)
Immature Granulocytes: 0 %
Lymphocytes Relative: 23 %
Lymphs Abs: 1.1 10*3/uL (ref 0.7–4.0)
MCH: 31.7 pg (ref 26.0–34.0)
MCHC: 32.5 g/dL (ref 30.0–36.0)
MCV: 97.2 fL (ref 80.0–100.0)
Monocytes Absolute: 0.8 10*3/uL (ref 0.1–1.0)
Monocytes Relative: 15 %
Neutro Abs: 2.7 10*3/uL (ref 1.7–7.7)
Neutrophils Relative %: 56 %
Platelets: 226 10*3/uL (ref 150–400)
RBC: 2.18 MIL/uL — ABNORMAL LOW (ref 3.87–5.11)
RDW: 17.4 % — ABNORMAL HIGH (ref 11.5–15.5)
WBC: 4.9 10*3/uL (ref 4.0–10.5)
nRBC: 0 % (ref 0.0–0.2)

## 2020-10-10 LAB — PROTIME-INR
INR: 3.3 — ABNORMAL HIGH (ref 0.8–1.2)
INR: 2.9 — ABNORMAL HIGH (ref 0.8–1.2)
Prothrombin Time: 33.7 seconds — ABNORMAL HIGH (ref 11.4–15.2)
Prothrombin Time: 30 seconds — ABNORMAL HIGH (ref 11.4–15.2)

## 2020-10-10 LAB — BASIC METABOLIC PANEL
Anion gap: 10 (ref 5–15)
BUN: 77 mg/dL — ABNORMAL HIGH (ref 8–23)
CO2: 22 mmol/L (ref 22–32)
Calcium: 8.8 mg/dL — ABNORMAL LOW (ref 8.9–10.3)
Chloride: 103 mmol/L (ref 98–111)
Creatinine, Ser: 1.76 mg/dL — ABNORMAL HIGH (ref 0.44–1.00)
GFR, Estimated: 26 mL/min — ABNORMAL LOW (ref >60)
Glucose, Bld: 120 mg/dL — ABNORMAL HIGH (ref 70–99)
Potassium: 4.7 mmol/L (ref 3.5–5.1)
Sodium: 135 mmol/L (ref 135–145)

## 2020-10-10 MED ORDER — ACETAMINOPHEN 325 MG PO TABS
650.0000 mg | ORAL_TABLET | Freq: Once | ORAL | Status: AC
  Administered 2020-10-10: 650 mg via ORAL
  Filled 2020-10-10: qty 2

## 2020-10-10 MED ORDER — TRANEXAMIC ACID FOR EPISTAXIS
500.0000 mg | Freq: Once | TOPICAL | Status: AC
  Administered 2020-10-10: 500 mg via TOPICAL
  Filled 2020-10-10: qty 10

## 2020-10-10 MED ORDER — SILVER NITRATE-POT NITRATE 75-25 % EX MISC
2.0000 | Freq: Once | CUTANEOUS | Status: AC
  Administered 2020-10-10: 2 via TOPICAL
  Filled 2020-10-10: qty 20

## 2020-10-10 MED ORDER — SILVER NITRATE-POT NITRATE 75-25 % EX MISC
2.0000 | Freq: Once | CUTANEOUS | Status: AC
  Administered 2020-10-10: 2 via TOPICAL

## 2020-10-10 NOTE — ED Notes (Signed)
PA Rob informed critical hgb 6.9

## 2020-10-10 NOTE — ED Triage Notes (Signed)
Patient is from home. She had feeding tube placed Thursday. They took her off of warfarin Thursday as well. She has been having bleeding around the site. She felt pressure and then a release of blood. Suppose to have stents placed Wednesday.   132/64- 68-100%- 18rr

## 2020-10-10 NOTE — ED Notes (Signed)
Discharged no concerns at this time.  °

## 2020-10-10 NOTE — ED Provider Notes (Signed)
Emergency Medicine Provider Triage Evaluation Note  Shannon [name] , a 87 y.o. female  was evaluated in triage.  Pt complains of bleeding from G-tube site.  Seen yesterday for the same.  On coumadin.  Also reports having rectal bleeding with each bowel movement.  Denies being in pain currently.  Review of Systems  Positive: Bleeding around g-tube site, adequately controlled with gauze Negative: Pain, fever  Physical Exam  There were no vitals taken for this visit. Gen:   Awake, no distress   Resp:  Normal effort  MSK:   Moves extremities without difficulty  Other:  Bloody bandages around g-tube, but seems to be controlled currently  Medical Decision Making  Medically screening exam initiated at 10:20 PM.  Appropriate orders placed.  TANAIYA KOLARIK was informed that the remainder of the evaluation will be completed by another provider, this initial triage assessment does not replace that evaluation, and the importance of remaining in the ED until their evaluation is complete.     Montine Circle, PA-C 10/10/20 [place], [place], DO 10/10/20 [id number]

## 2020-10-10 NOTE — ED Provider Notes (Signed)
[facility] DEPT Provider Note   CSN: [id number] Arrival date & time: 10/10/20  0258     History Chief Complaint  Patient presents with   bleed    Around feeding tube site    Shannon [name] is a 87 y.o. female.  The history is provided by the patient.  Illness Location:  PEG tube insertion Quality:  Skin bleeding Severity:  Mild Onset quality:  Gradual Duration:  2 hours Timing:  Constant Progression:  Unchanged Chronicity:  New Context:  Had high INR on Thursday and coumadin was stopped Relieved by:  Nothing Worsened by:  Nothing Ineffective treatments:  None Associated symptoms: no abdominal pain, no fatigue, no fever, no headaches, no loss of consciousness, no rash and no vomiting   Patient who was on coumadin until Thursday with recent PEG tube changes presents with bleeding at the site.  Approximately 5 inch area of blood on gauze.     Past Medical History:  Diagnosis Date   Acute respiratory failure ([place])    Anemia    previous blood transfusions   Arthritis    "all over"   Asthma    Atrial flutter (Shannon [name])    Bradycardia    requiring previous d/c of BB and reduction of amiodarone   CAD (coronary artery disease)    nonobstructive per notes   Chronic diastolic CHF (congestive heart failure) ([place])    CKD (chronic kidney disease), stage III ([place])    Complication of blood transfusion    "got the wrong blood type at [facility] in ~ 2015; no adverse reaction that we are aware of"/daughter, Shannon [name] (01/27/2016)   COPD (chronic obstructive pulmonary disease) ([place])    Depression    "light case"   DVT (deep venous thrombosis) ([place]) 01/2016   a. LLE DVT 01/2016 - switched from Eliquis to Coumadin.   Dyspnea    with some activity   Gastric stenosis    a. s/p stomach tube   GERD (gastroesophageal reflux disease)    Headache    History of 2019 novel coronavirus disease (COVID-19)    History of blood transfusion    "several" (01/27/2016)    History of stomach ulcers    Hyperlipidemia    Hypertension    Hypothyroidism    On home oxygen therapy    "2L; 9PM til 9AM" (06/27/2017)   PAD (peripheral artery disease) (Shannon [name])    a. prior gangrene, toe amputations, intervention.   PAF (paroxysmal atrial fibrillation) ([place])    Paraesophageal hernia    Perforated gastric ulcer ([place])    Pneumonia    "a few times" (06/27/2017)   Seasonal allergies    SIADH (syndrome of inappropriate ADH production) ([place])    Shannon [name] 01/10/2015   Small bowel obstruction ([place])    "I don't know how many" (01/11/2015)   Stroke (Shannon [name])    several- no residual   Type II diabetes mellitus ([place])    "related to prednisone use  for > 20 yr; once predinose stopped; no more DM RX" (01/27/2016)   UTI (urinary tract infection) 02/08/2016   Ventral hernia with bowel obstruction     Patient Active Problem List   Diagnosis Date Noted   Depression, major, single episode, complete remission ([place]) 06/09/2020   History of transmetatarsal amputation of right foot (Shannon [name]) 11/14/2019   Chest pain 08/27/2019   Lab test positive for detection of COVID-19 virus 08/27/2019   Acute encephalopathy 07/07/2019   AMS (altered mental status) 07/07/2019  Varicose veins of right lower extremity with ulcer other part of foot ([place]) 06/11/2019   Jejunostomy tube fell out 03/02/2019   S/P amputation of lesser toe, left (Shannon [name]) 02/13/2019   BRBPR (bright red blood per rectum) 01/10/2019   GI bleed 12/25/2018   Bradycardia 11/25/2018   Amputated great toe of right foot ([place]) 05/06/2018   Peripheral vascular disease ([place]) 05/03/2018   Localized, primary osteoarthritis of shoulder region 01/21/2018   PAD (peripheral artery disease) ([place]) 06/27/2017   AKI (acute kidney injury) ([place]) 00/17/4944   Complication of feeding tube ([place]) 02/20/2017   PEG tube malfunction ([place])    COPD with asthma (Shannon [name]) 10/03/2016   Chronic deep vein thrombosis (DVT) of tibial vein of left lower extremity (Shannon [name])  10/03/2016   Gangrene of toe of left foot (Shannon [name]) 09/15/2016   Recurrent aspiration pneumonia (Shannon [name])    Atherosclerosis of native arteries of the extremities with gangrene ([place]) 07/28/2016   Other specified hypothyroidism    CHF (NYHA class IV, ACC/AHA stage D) (Shannon [name])    Shortness of breath    Pressure injury of skin 03/11/2016   PEG (percutaneous endoscopic gastrostomy) status ([place]) 03/10/2016   Encounter for therapeutic drug monitoring 02/16/2016   Warfarin anticoagulation    Chronic seasonal allergic rhinitis    Atrial fibrillation and flutter ([place]) 01/24/2016   Chronic diastolic CHF (congestive heart failure) (Shannon [name]) 01/24/2016   Bilateral leg edema 12/29/2015   Hypothyroidism due to medication    Weakness    Pyloric stenosis    Pressure ulcer 09/15/2015   PVD (peripheral vascular disease) ([place])    Diabetic ulcer of toe of right foot associated with type 2 diabetes mellitus, with necrosis of muscle ([place])    Diverticulitis of large intestine without perforation or abscess without bleeding    Colitis 08/26/2015   Esophageal candidiasis (Shannon [name])    SOB (shortness of breath)    Acute renal failure superimposed on stage 4 chronic kidney disease (Shannon [name])    Encounter for long-term (current) use of other high-risk medications    Dysphagia 07/05/2015   Asthma with allergic rhinitis 06/04/2015   Hypertensive heart disease 03/18/2015   Malnutrition of moderate degree 01/28/2015   Syncope 01/27/2015   Symptomatic bradycardia 01/21/2015   Hypotension due to drugs 01/21/2015   Protein-calorie malnutrition, severe 01/12/2015   Hypertension associated with diabetes (Shannon [name]) 01/12/2015   Cerebrovascular disease 07/14/2014   Diabetic polyneuropathy associated with type 2 diabetes mellitus (Shannon [name]) 07/14/2014   Hyperlipidemia 07/14/2014   Gastric outlet obstruction 05/21/2014   Unstable gait 04/29/2014   Slurred speech    Gastroparesis    Constipation 02/03/2014   Carotid stenosis 02/03/2014   Demand  ischemia ([place]) 01/21/2014   Gastric bezoar 01/20/2014   Anemia of chronic disease 01/18/2014   CKD (chronic kidney disease) stage 3, GFR 30-59 ml/min 01/18/2014   Depression 09/29/2013   Essential hypertension, benign 09/22/2013   GERD (gastroesophageal reflux disease) 09/22/2013   Hyperlipidemia associated with type 2 diabetes mellitus ([place]) 09/22/2013   Type 2 diabetes mellitus with chronic kidney disease ([place]) 09/22/2013   Persistent atrial fibrillation 09/22/2013   History of completed stroke 09/22/2013   Diabetic neuropathy ([place]) 09/22/2013   Hypothyroidism 09/22/2013   Chronic gastrojejunal ulcer with perforation ([place]) 09/07/2013   Accelerated hypertension 09/07/2013   Other specified abnormal immunological findings in serum 09/07/2013   Red blood cell antibody positive 09/07/2013    Past Surgical History:  Procedure Laterality Date   ABDOMINAL AORTOGRAM N/A 09/21/2016   Procedure: Abdominal Aortogram;  Surgeon: Waynetta Sandy, MD;  Location: [facility];  Service: Cardiovascular;  Laterality: N/A;   AMPUTATION Left 09/25/2016   Procedure: AMPUTATION DIGIT- LEFT 2ND AND 3RD TOES;  Surgeon: Rosetta Posner, MD;  Location: [facility];  Service: Vascular;  Laterality: Left;   AMPUTATION Right 05/03/2018   Procedure: AMPUTATION RIGHT GREAT TOE;  Surgeon: Rosetta Posner, MD;  Location: [facility];  Service: Vascular;  Laterality: Right;   CATARACT EXTRACTION W/ INTRAOCULAR LENS  IMPLANT, BILATERAL     CHOLECYSTECTOMY OPEN     COLECTOMY     ESOPHAGOGASTRODUODENOSCOPY N/A 01/19/2014   Procedure: ESOPHAGOGASTRODUODENOSCOPY (EGD);  Surgeon: Irene Shipper, MD;  Location: [facility];  Service: Endoscopy;  Laterality: N/A;   ESOPHAGOGASTRODUODENOSCOPY N/A 01/20/2014   Procedure: ESOPHAGOGASTRODUODENOSCOPY (EGD);  Surgeon: Irene Shipper, MD;  Location: [facility];  Service: Endoscopy;  Laterality: N/A;   ESOPHAGOGASTRODUODENOSCOPY N/A 03/19/2014   Procedure: ESOPHAGOGASTRODUODENOSCOPY  (EGD);  Surgeon: Milus Banister, MD;  Location: [facility];  Service: Endoscopy;  Laterality: N/A;   ESOPHAGOGASTRODUODENOSCOPY N/A 07/08/2015   Procedure: ESOPHAGOGASTRODUODENOSCOPY (EGD);  Surgeon: Doran Stabler, MD;  Location: [facility];  Service: Endoscopy;  Laterality: N/A;   ESOPHAGOGASTRODUODENOSCOPY (EGD) WITH PROPOFOL N/A 09/15/2015   Procedure: ESOPHAGOGASTRODUODENOSCOPY (EGD) WITH PROPOFOL;  Surgeon: Manus Gunning, MD;  Location: [facility];  Service: Gastroenterology;  Laterality: N/A;   GASTROJEJUNOSTOMY     hx/notes 01/10/2015   GASTROJEJUNOSTOMY N/A 09/23/2015   Procedure: OPEN GASTROJEJUNOSTOMY TUBE PLACEMENT;  Surgeon: Arta Bruce Kinsinger, MD;  Location: [facility];  Service: General;  Laterality: N/A;   GLAUCOMA SURGERY Bilateral    HERNIA REPAIR  2015   IR CM INJ ANY COLONIC TUBE W/FLUORO  01/05/2017   IR CM INJ ANY COLONIC TUBE W/FLUORO  06/07/2017   IR CM INJ ANY COLONIC TUBE W/FLUORO  11/05/2017   IR CM INJ ANY COLONIC TUBE W/FLUORO  09/18/2018   IR CM INJ ANY COLONIC TUBE W/FLUORO  03/06/2019   IR CM INJ ANY COLONIC TUBE W/FLUORO  03/11/2019   IR GASTR TUBE CONVERT GASTR-JEJ PER W/FL MOD SED  04/30/2020   IR GENERIC HISTORICAL  01/07/2016   IR GJ TUBE CHANGE 01/07/2016 Jacqulynn Cadet, MD [facility]   IR GENERIC HISTORICAL  01/27/2016   IR MECH REMOV OBSTRUC MAT ANY COLON TUBE W/FLUORO 01/27/2016 Markus Daft, MD [facility]   IR GENERIC HISTORICAL  02/07/2016   IR PATIENT EVAL TECH 0-60 MINS Darrell K Allred, PA-C [facility]   IR GENERIC HISTORICAL  02/08/2016   IR GJ TUBE CHANGE 02/08/2016 Greggory Keen, MD [facility]   IR GENERIC HISTORICAL  01/06/2016   IR GJ TUBE CHANGE 01/06/2016 [facility] OUT REF   IR GENERIC HISTORICAL  05/02/2016   IR CM INJ ANY COLONIC TUBE W/FLUORO 05/02/2016 Arne Cleveland, MD [facility]   IR GENERIC HISTORICAL  05/15/2016   IR GJ TUBE CHANGE 05/15/2016 Sandi Mariscal, MD [facility]   IR GENERIC HISTORICAL  06/28/2016   IR GJ TUBE  CHANGE 06/28/2016 [facility]   IR GJ TUBE CHANGE  02/20/2017   IR GJ TUBE CHANGE  05/10/2017   IR GJ TUBE CHANGE  07/06/2017   IR GJ TUBE CHANGE  08/02/2017   IR GJ TUBE CHANGE  10/15/2017   IR GJ TUBE CHANGE  12/26/2017   IR GJ TUBE CHANGE  04/08/2018   IR [place] TUBE CHANGE  06/19/2018   IR [place] TUBE CHANGE  03/03/2019   IR [place] TUBE CHANGE  06/06/2019   IR GJ TUBE CHANGE  09/26/2019   IR GJ TUBE CHANGE  01/09/2020   IR GJ TUBE CHANGE  05/12/2020   IR GJ TUBE CHANGE  10/05/2020   IR PATIENT EVAL TECH 0-60 MINS  10/19/2016   IR PATIENT EVAL TECH 0-60 MINS  12/25/2016   IR PATIENT EVAL TECH 0-60 MINS  01/29/2017   IR PATIENT EVAL TECH 0-60 MINS  04/04/2017   IR PATIENT EVAL TECH 0-60 MINS  04/30/2017   IR PATIENT EVAL TECH 0-60 MINS  07/31/2017   IR REPLC DUODEN/JEJUNO TUBE PERCUT W/FLUORO  11/14/2016   LAPAROTOMY N/A 01/20/2015   Procedure: EXPLORATORY LAPAROTOMY;  Surgeon: Coralie Keens, MD;  Location: [facility];  Service: General;  Laterality: N/A;   LOWER EXTREMITY ANGIOGRAPHY Left 09/21/2016   Procedure: Lower Extremity Angiography;  Surgeon: Waynetta Sandy, MD;  Location: [facility];  Service: Cardiovascular;  Laterality: Left;   LOWER EXTREMITY ANGIOGRAPHY Right 06/27/2017   Procedure: Lower Extremity Angiography;  Surgeon: Waynetta Sandy, MD;  Location: [facility];  Service: Cardiovascular;  Laterality: Right;   LYSIS OF ADHESION N/A 01/20/2015   Procedure: LYSIS OF ADHESIONS < 1 HOUR;  Surgeon: Coralie Keens, MD;  Location: [facility];  Service: General;  Laterality: N/A;   PERIPHERAL VASCULAR BALLOON ANGIOPLASTY Left 09/21/2016   Procedure: Peripheral Vascular Balloon Angioplasty;  Surgeon: Waynetta Sandy, MD;  Location: [facility];  Service: Cardiovascular;  Laterality: Left;  drug coated balloon   PERIPHERAL VASCULAR BALLOON ANGIOPLASTY Right 06/27/2017   Procedure: PERIPHERAL VASCULAR BALLOON ANGIOPLASTY;  Surgeon: Waynetta Sandy, MD;   Location: [facility];  Service: Cardiovascular;  Laterality: Right;  [id number]   TONSILLECTOMY     TRANSMETATARSAL AMPUTATION Right 11/14/2019   Procedure: TRANSMETATARSAL AMPUTATION RIGHT FOOT;  Surgeon: Edrick Kins, DPM;  Location: [facility];  Service: Podiatry;  Laterality: Right;   TUBAL LIGATION     VENTRAL HERNIA REPAIR  2015   incarcerated ventral hernia ([facility] 09/2013)/notes 01/10/2015     OB History   No obstetric history on file.     Family History  Problem Relation Age of Onset   Stroke Mother    Hypertension Mother    Diabetes Brother    Glaucoma Son    Glaucoma Son    Heart attack Neg Hx    Colon cancer Neg Hx    Stomach cancer Neg Hx     Social History   Tobacco Use   Smoking status: Never   Smokeless tobacco: Former    Types: Snuff    Quit date: 04/03/1980   Tobacco comments:    "used snuff in my younger days"  Vaping Use   Vaping Use: Never used  Substance Use Topics   Alcohol use: Never    Alcohol/week: 0.0 standard drinks   Drug use: Never    Home Medications Prior to Admission medications   Medication Sig Start Date End Date Taking? Authorizing Provider  acetaminophen (TYLENOL) 500 MG tablet Take 500 mg by mouth every 6 (six) hours as needed for moderate pain.    [provider]  amLODipine (NORVASC) 5 MG tablet Take 1 tablet (5 mg total) by mouth daily. 10/20/19 10/19/20  Medina-Vargas, Monina C, NP  atorvastatin (LIPITOR) 10 MG tablet TAKE 1 TABLET BY MOUTH EVERY DAY Patient taking differently: Take 10 mg by mouth daily. 06/24/20   [name], NP  b complex vitamins tablet Take 1 tablet by mouth  daily.     [provider]  benzocaine (AMERICAINE) 20 % rectal ointment Place rectally every 3 (three) hours as needed for pain. 09/24/19   Fawze, Mina A, PA-C  cholecalciferol (VITAMIN D3) 25 MCG (1000 UNIT) tablet Take 1,000 Units by mouth daily.    [provider]  cycloSPORINE (RESTASIS) 0.05 % ophthalmic emulsion  USE 1 DROP INTO BOTH EYES TWICE DAILY Patient taking differently: Place 1 drop into both eyes 2 (two) times daily. 01/29/17   Gildardo Cranker, DO  DULoxetine (CYMBALTA) 30 MG capsule TAKE 1 CAPSULE BY MOUTH EVERY DAY Patient taking differently: Take 30 mg by mouth daily. 05/05/20   [name], NP  ferrous sulfate 220 (44 Fe) MG/5ML solution TAKE 7.4 MLS (325 MG TOTAL) BY MOUTH DAILY. 03/04/20   [name], NP  fluticasone (FLONASE) 50 MCG/ACT nasal spray SPRAY 2 SPRAYS INTO EACH NOSTRIL EVERY DAY Patient taking differently: Place 2 sprays into both nostrils in the morning and at bedtime. 06/25/19   [name], NP  furosemide (LASIX) 40 MG tablet TAKE 0.5-1 TABLETS (20-40 MG TOTAL) BY MOUTH SEE ADMIN INSTRUCTIONS. TAKE 20 MG DAILY, MAY INSTEAD TAKE 40 MG DAILY AS NEEDED FOR EDEMA 08/04/20   Freada Bergeron, MD  gabapentin (NEURONTIN) 300 MG capsule Take 1 capsule (300 mg total) by mouth 2 (two) times daily. 05/17/20   [name], NP  gentamicin cream (GARAMYCIN) 0.1 % Apply 1 application topically daily as needed (dry skin). 10/01/19   Edrick Kins, DPM  hydrALAZINE (APRESOLINE) 50 MG tablet TAKE 1 TABLET BY MOUTH THREE TIMES A DAY Patient taking differently: Take 50 mg by mouth 3 (three) times daily. 04/26/20   Dorothy Spark, MD  insulin aspart (NOVOLOG) 100 UNIT/ML FlexPen Inject 2-6 Units into the skin as needed for high blood sugar. New sliding Scale If BS is 151 - 251 = 2 units,  If BS is 251 -  350 = 4 units,  If BS is 351 - 450 = 6 units Call provider if BS runs more than 400    [provider]  Insulin Pen Needle (B-D UF III MINI PEN NEEDLES) 31G X 5 MM MISC Use twice daily with giving insulin injections. Dx E11.9 07/17/18   [name], NP  ipratropium-albuterol (DUONEB) 0.5-2.5 (3) MG/3ML SOLN Take 3 mLs by nebulization every 6 (six) hours as needed (shortness of breath).    [provider]  isosorbide mononitrate (IMDUR) 30 MG 24 hr  tablet TAKE 2 TABLETS BY MOUTH DAILY. Patient taking differently: Take 60 mg by mouth in the morning. 09/20/20   Freada Bergeron, MD  LANTUS SOLOSTAR 100 UNIT/ML Solostar Pen INJECT 8 UNITS INTO THE SKIN AT BEDTIME. Patient taking differently: Inject 8 Units into the skin at bedtime as needed (blood sugar 150 or above). 05/10/20   [name], NP  latanoprost (XALATAN) 0.005 % ophthalmic solution Place 1 drop into both eyes at bedtime.    [provider]  levalbuterol Penne Lash HFA) 45 MCG/ACT inhaler Inhale 2 puffs into the lungs every 6 (six) hours as needed for wheezing.    [provider]  levothyroxine (SYNTHROID) 125 MCG tablet TAKE 1 TABLET BY MOUTH EVERY DAY Patient taking differently: Take 125 mcg by mouth daily before breakfast. 06/24/20   [name], NP  loratadine (CLARITIN) 10 MG tablet Take 10 mg by mouth daily.    [provider]  mometasone-formoterol (DULERA) 200-5 MCG/ACT AERO INHALE TWO  PUFFS BY MOUTH TWICE DAILY 10/05/20   [name], NP  Multiple Vitamin (MULTIVITAMIN WITH MINERALS) TABS tablet Take 1 tablet by mouth daily.    [provider]  Nutritional Supplements (FEEDING SUPPLEMENT, OSMOLITE 1.5 CAL,) LIQD Place 711 mLs into feeding tube See admin instructions. Take 711 ml over a 12 hour period per day 9 am-9 pm at a rate of 65    [provider]  nystatin (MYCOSTATIN) 100000 UNIT/ML suspension Take 5 mLs (500,000 Units total) by mouth 4 (four) times daily. Patient taking differently: Take 5 mLs by mouth 4 (four) times daily as needed (thrush). 09/06/20   [name], NP  nystatin (MYCOSTATIN/NYSTOP) powder Apply 1 application topically as needed (skin irritation). 09/11/19   Ngetich, Dinah C, NP  nystatin cream (MYCOSTATIN) APPLY TO AFFECTED AREA TWICE A DAY Patient taking differently: Apply 1 application topically at bedtime. 05/10/20   [name], NP  oxyCODONE (ROXICODONE) 5 MG immediate release  tablet Take 0.5-1 tablets (2.5-5 mg total) by mouth every 8 (eight) hours as needed for severe pain. 10/05/20   [name], NP  pantoprazole (PROTONIX) 40 MG tablet Take 1 tablet (40 mg total) by mouth daily. Patient taking differently: Take 40 mg by mouth daily with supper. 09/23/20   Freada Bergeron, MD  Polyethyl Glycol-Propyl Glycol (SYSTANE) 0.4-0.3 % GEL ophthalmic gel Place 1 application into both eyes 2 (two) times daily as needed (moisture).    [provider]  Probiotic Product (EQL PROBIOTIC COLON SUPPORT PO) Take 1 capsule by mouth daily.    [provider]  senna-docusate (SENOKOT-S) 8.6-50 MG tablet Take 1 tablet by mouth as needed for mild constipation.    [provider]  warfarin (COUMADIN) 2.5 MG tablet TAKE 2-3 TABLETS BY MOUTH DAILY AS DIRECTED BY COUMADIN CLINIC. Patient taking differently: Take 5-7.5 mg by mouth See admin instructions. Take 5 mg by mouth on Monday, Tuesday, Wednesday, Friday and Satuday, take 7.5 mg on Sunday and Thursday 05/18/20   Dorothy Spark, MD  Water For Irrigation, Sterile (FREE WATER) SOLN Place 180 mLs into feeding tube 3 (three) times daily.    [provider]  White Petrolatum-Mineral Oil (SYSTANE NIGHTTIME) OINT Place 1 application into both eyes at bedtime.     [provider]    Allergies    Penicillins  Review of Systems   Review of Systems  Constitutional:  Negative for fatigue and fever.  HENT:  Negative for drooling.   Eyes:  Negative for redness.  Cardiovascular:  Negative for leg swelling.  Gastrointestinal:  Negative for abdominal pain and vomiting.  Genitourinary:  Negative for difficulty urinating.  Musculoskeletal:  Negative for neck stiffness.  Skin:  Negative for rash.  Neurological:  Negative for loss of consciousness and headaches.  Psychiatric/Behavioral:  Negative for agitation.   All other systems reviewed and are negative.  Physical Exam Updated Vital  Signs BP (!) 148/70 (BP Location: Right Arm)   Pulse 72   Temp 97.8 F (36.6 C) (Oral)   Resp 17   Ht 5\' 6"  (1.676 m)   Wt 55.8 kg   SpO2 99%   BMI 19.85 kg/m   Physical Exam Vitals and nursing note reviewed.  Constitutional:      General: She is not in acute distress.    Appearance: Normal appearance.  HENT:     Head: Normocephalic and atraumatic.     Nose: Nose normal.  Eyes:     Conjunctiva/sclera:  Conjunctivae normal.     Pupils: Pupils are equal, round, and reactive to light.  Cardiovascular:     Rate and Rhythm: Normal rate and regular rhythm.     Pulses: Normal pulses.     Heart sounds: Normal heart sounds.  Pulmonary:     Effort: Pulmonary effort is normal.     Breath sounds: Normal breath sounds.  Abdominal:     General: Abdomen is flat. Bowel sounds are normal.     Palpations: Abdomen is soft.    Musculoskeletal:     Cervical back: Normal range of motion and neck supple.  Skin:    General: Skin is warm and dry.     Capillary Refill: Capillary refill takes less than 2 seconds.  Neurological:     General: No focal deficit present.     Mental Status: She is alert and oriented to person, place, and time.     Deep Tendon Reflexes: Reflexes normal.  Psychiatric:        Mood and Affect: Mood normal.        Behavior: Behavior normal.    ED Results / Procedures / Treatments   Labs (all labs ordered are listed, but only abnormal results are displayed) Labs Reviewed - No data to display  EKG None  Radiology No results found.  Procedures Cauterization  Date/Time: 10/10/2020 3:45 AM Performed by: Veatrice Kells, MD Authorized by: Veatrice Kells, MD  Consent: Verbal consent obtained. Risks and benefits: risks, benefits and alternatives were discussed Consent given by: patient Patient understanding: patient states understanding of the procedure being performed Patient identity confirmed: verbally with patient Time out: Immediately prior to procedure a  "time out" was called to verify the correct patient, procedure, equipment, support staff and site/side marked as required. Preparation: Patient was prepped and draped in the usual sterile fashion. Local anesthesia used: no  Anesthesia: Local anesthesia used: no  Sedation: Patient sedated: no  Patient tolerance: patient tolerated the procedure well with no immediate complications Comments: Silver nitrate to the skin and then combat guaze with immediate cessation of skin bleeding.       Medications Ordered in ED Medications  silver nitrate applicators applicator 2 Stick (has no administration in time range)  silver nitrate applicators applicator 2 Stick (has no administration in time range)    ED Course  I have reviewed the triage vital signs and the nursing notes.  Pertinent labs & imaging results that were available during my care of the patient were reviewed by me and considered in my medical decision making (see chart for details).  Legal guardian contacted.  INR improved at 3.3 contact your doctor for further guidance.    Shannon [name] was evaluated in Emergency Department on 10/10/2020 for the symptoms described in the history of present illness. She was evaluated in the context of the global COVID-19 pandemic, which necessitated consideration that the patient might be at risk for infection with the SARS-CoV-2 virus that causes COVID-19. Institutional protocols and algorithms that pertain to the evaluation of patients at risk for COVID-19 are in a state of rapid change based on information released by regulatory bodies including the CDC and federal and state organizations. These policies and algorithms were followed during the patient's care in the ED.  Final Clinical Impression(s) / ED Diagnoses Final diagnoses:  None   Return for intractable cough, coughing up blood, fevers > 100.4 unrelieved by medication, shortness of breath, intractable vomiting, chest pain, shortness of  breath, weakness, numbness, changes  in speech, facial asymmetry, abdominal pain, passing out, Inability to tolerate liquids or food, cough, altered mental status or any concerns. No signs of systemic illness or infection. The patient is nontoxic-appearing on exam and vital signs are within normal limits. I have reviewed the triage vital signs and the nursing notes. Pertinent labs & imaging results that were available during my care of the patient were reviewed by me and considered in my medical decision making (see chart for details). After history, exam, and medical workup I feel the patient has been appropriately medically screened and is safe for discharge home. Pertinent diagnoses were discussed with the patient. Patient was given return precautions.  Rx / DC Orders ED Discharge Orders     None        Xavier Fournier, MD 10/10/20 [phone]

## 2020-10-10 NOTE — Discharge Instructions (Addendum)
INR 3.3

## 2020-10-10 NOTE — ED Triage Notes (Signed)
Pt BIB [facility] from home c/o ongoing bleeding to G tube. Recent had G tube replacement. Seen at [facility] for onset of bleeding, discharge home this am. Bleeding continued through the day. Has had to change dressing x3. Denies recent trauma/pain. States continued to take warfarin. Has also has rectal bleeding.

## 2020-10-11 ENCOUNTER — Ambulatory Visit: Payer: Medicare Other | Admitting: Nurse Practitioner

## 2020-10-11 ENCOUNTER — Encounter (HOSPITAL_COMMUNITY): Payer: Self-pay | Admitting: Emergency Medicine

## 2020-10-11 DIAGNOSIS — Z20822 Contact with and (suspected) exposure to covid-19: Secondary | ICD-10-CM | POA: Diagnosis present

## 2020-10-11 DIAGNOSIS — E039 Hypothyroidism, unspecified: Secondary | ICD-10-CM

## 2020-10-11 DIAGNOSIS — E43 Unspecified severe protein-calorie malnutrition: Secondary | ICD-10-CM

## 2020-10-11 DIAGNOSIS — K222 Esophageal obstruction: Secondary | ICD-10-CM | POA: Diagnosis present

## 2020-10-11 DIAGNOSIS — K922 Gastrointestinal hemorrhage, unspecified: Secondary | ICD-10-CM | POA: Diagnosis not present

## 2020-10-11 DIAGNOSIS — N1831 Chronic kidney disease, stage 3a: Secondary | ICD-10-CM | POA: Diagnosis present

## 2020-10-11 DIAGNOSIS — R791 Abnormal coagulation profile: Secondary | ICD-10-CM

## 2020-10-11 DIAGNOSIS — D62 Acute posthemorrhagic anemia: Secondary | ICD-10-CM

## 2020-10-11 DIAGNOSIS — I130 Hypertensive heart and chronic kidney disease with heart failure and stage 1 through stage 4 chronic kidney disease, or unspecified chronic kidney disease: Secondary | ICD-10-CM | POA: Diagnosis present

## 2020-10-11 DIAGNOSIS — I5032 Chronic diastolic (congestive) heart failure: Secondary | ICD-10-CM | POA: Diagnosis present

## 2020-10-11 DIAGNOSIS — I10 Essential (primary) hypertension: Secondary | ICD-10-CM | POA: Diagnosis not present

## 2020-10-11 DIAGNOSIS — Z880 Allergy status to penicillin: Secondary | ICD-10-CM | POA: Diagnosis not present

## 2020-10-11 DIAGNOSIS — I739 Peripheral vascular disease, unspecified: Secondary | ICD-10-CM | POA: Diagnosis not present

## 2020-10-11 DIAGNOSIS — Z86718 Personal history of other venous thrombosis and embolism: Secondary | ICD-10-CM | POA: Diagnosis not present

## 2020-10-11 DIAGNOSIS — D638 Anemia in other chronic diseases classified elsewhere: Secondary | ICD-10-CM | POA: Diagnosis not present

## 2020-10-11 DIAGNOSIS — Z8616 Personal history of COVID-19: Secondary | ICD-10-CM | POA: Diagnosis not present

## 2020-10-11 DIAGNOSIS — K921 Melena: Secondary | ICD-10-CM | POA: Diagnosis not present

## 2020-10-11 DIAGNOSIS — K2101 Gastro-esophageal reflux disease with esophagitis, with bleeding: Secondary | ICD-10-CM | POA: Diagnosis present

## 2020-10-11 DIAGNOSIS — K219 Gastro-esophageal reflux disease without esophagitis: Secondary | ICD-10-CM | POA: Diagnosis not present

## 2020-10-11 DIAGNOSIS — I4819 Other persistent atrial fibrillation: Secondary | ICD-10-CM | POA: Diagnosis present

## 2020-10-11 DIAGNOSIS — R64 Cachexia: Secondary | ICD-10-CM | POA: Diagnosis present

## 2020-10-11 DIAGNOSIS — Z681 Body mass index (BMI) 19 or less, adult: Secondary | ICD-10-CM | POA: Diagnosis not present

## 2020-10-11 DIAGNOSIS — Z7901 Long term (current) use of anticoagulants: Secondary | ICD-10-CM | POA: Diagnosis not present

## 2020-10-11 DIAGNOSIS — K9421 Gastrostomy hemorrhage: Secondary | ICD-10-CM | POA: Diagnosis present

## 2020-10-11 DIAGNOSIS — K317 Polyp of stomach and duodenum: Secondary | ICD-10-CM | POA: Diagnosis present

## 2020-10-11 DIAGNOSIS — E1151 Type 2 diabetes mellitus with diabetic peripheral angiopathy without gangrene: Secondary | ICD-10-CM | POA: Diagnosis present

## 2020-10-11 DIAGNOSIS — N1832 Chronic kidney disease, stage 3b: Secondary | ICD-10-CM

## 2020-10-11 DIAGNOSIS — Z794 Long term (current) use of insulin: Secondary | ICD-10-CM | POA: Diagnosis not present

## 2020-10-11 DIAGNOSIS — D6859 Other primary thrombophilia: Secondary | ICD-10-CM | POA: Diagnosis present

## 2020-10-11 DIAGNOSIS — E1122 Type 2 diabetes mellitus with diabetic chronic kidney disease: Secondary | ICD-10-CM | POA: Diagnosis present

## 2020-10-11 DIAGNOSIS — K311 Adult hypertrophic pyloric stenosis: Secondary | ICD-10-CM | POA: Diagnosis present

## 2020-10-11 DIAGNOSIS — K9413 Enterostomy malfunction: Secondary | ICD-10-CM | POA: Diagnosis not present

## 2020-10-11 DIAGNOSIS — I4892 Unspecified atrial flutter: Secondary | ICD-10-CM | POA: Diagnosis present

## 2020-10-11 DIAGNOSIS — F32A Depression, unspecified: Secondary | ICD-10-CM | POA: Diagnosis present

## 2020-10-11 DIAGNOSIS — J449 Chronic obstructive pulmonary disease, unspecified: Secondary | ICD-10-CM | POA: Diagnosis present

## 2020-10-11 DIAGNOSIS — Z66 Do not resuscitate: Secondary | ICD-10-CM | POA: Diagnosis present

## 2020-10-11 LAB — RESP PANEL BY RT-PCR (FLU A&B, COVID) ARPGX2
Influenza A by PCR: NEGATIVE
Influenza B by PCR: NEGATIVE
SARS Coronavirus 2 by RT PCR: NEGATIVE

## 2020-10-11 LAB — HEMOGLOBIN AND HEMATOCRIT, BLOOD
HCT: 31.5 % — ABNORMAL LOW (ref 36.0–46.0)
HCT: 31.7 % — ABNORMAL LOW (ref 36.0–46.0)
Hemoglobin: 10.3 g/dL — ABNORMAL LOW (ref 12.0–15.0)
Hemoglobin: 10.7 g/dL — ABNORMAL LOW (ref 12.0–15.0)

## 2020-10-11 LAB — PREPARE RBC (CROSSMATCH)

## 2020-10-11 LAB — PROTIME-INR
INR: 1.3 — ABNORMAL HIGH (ref 0.8–1.2)
Prothrombin Time: 16.5 seconds — ABNORMAL HIGH (ref 11.4–15.2)

## 2020-10-11 LAB — GLUCOSE, CAPILLARY: Glucose-Capillary: 183 mg/dL — ABNORMAL HIGH (ref 70–99)

## 2020-10-11 LAB — POC OCCULT BLOOD, ED: Fecal Occult Bld: POSITIVE — AB

## 2020-10-11 MED ORDER — NYSTATIN 100000 UNIT/GM EX POWD: 1.0000 "application " | CUTANEOUS | Status: DC | PRN

## 2020-10-11 MED ORDER — NYSTATIN 100000 UNIT/ML MT SUSP: 5.0000 mL | Freq: Four times a day (QID) | OROMUCOSAL | Status: DC | PRN

## 2020-10-11 MED ORDER — ISOSORBIDE MONONITRATE ER 60 MG PO TB24
60.0000 mg | ORAL_TABLET | Freq: Every morning | ORAL | Status: DC
  Administered 2020-10-11 – 2020-10-14 (×4): 60 mg via ORAL
  Filled 2020-10-11 (×2): qty 1
  Filled 2020-10-11: qty 2
  Filled 2020-10-11: qty 1

## 2020-10-11 MED ORDER — HYDRALAZINE HCL 50 MG PO TABS
50.0000 mg | ORAL_TABLET | Freq: Three times a day (TID) | ORAL | Status: DC
  Administered 2020-10-11 – 2020-10-14 (×9): 50 mg via ORAL
  Filled 2020-10-11 (×9): qty 1
  Filled 2020-10-11: qty 2

## 2020-10-11 MED ORDER — OXYCODONE HCL 5 MG PO TABS
2.5000 mg | ORAL_TABLET | Freq: Three times a day (TID) | ORAL | Status: DC | PRN
Start: 2020-10-11 — End: 2020-10-14

## 2020-10-11 MED ORDER — OSMOLITE 1.5 CAL PO LIQD: 711.0000 mL | ORAL | Status: DC

## 2020-10-11 MED ORDER — FUROSEMIDE 20 MG PO TABS
20.0000 mg | ORAL_TABLET | Freq: Every day | ORAL | Status: DC
  Administered 2020-10-11 – 2020-10-14 (×4): 20 mg via ORAL
  Filled 2020-10-11 (×4): qty 1

## 2020-10-11 MED ORDER — FUROSEMIDE 20 MG PO TABS: 20.0000 mg | ORAL_TABLET | Freq: Every day | ORAL | Status: DC | PRN

## 2020-10-11 MED ORDER — FREE WATER
180.0000 mL | Freq: Three times a day (TID) | Status: DC
  Administered 2020-10-11 – 2020-10-12 (×3): 180 mL

## 2020-10-11 MED ORDER — LATANOPROST 0.005 % OP SOLN
1.0000 [drp] | Freq: Every day | OPHTHALMIC | Status: DC
  Administered 2020-10-11 – 2020-10-13 (×3): 1 [drp] via OPHTHALMIC
  Filled 2020-10-11: qty 2.5

## 2020-10-11 MED ORDER — CYCLOSPORINE 0.05 % OP EMUL
1.0000 [drp] | Freq: Two times a day (BID) | OPHTHALMIC | Status: DC
  Administered 2020-10-11 – 2020-10-14 (×6): 1 [drp] via OPHTHALMIC
  Filled 2020-10-11 (×9): qty 1

## 2020-10-11 MED ORDER — VITAMIN K1 10 MG/ML IJ SOLN
5.0000 mg | Freq: Once | INTRAVENOUS | Status: AC
  Administered 2020-10-11: 5 mg via INTRAVENOUS
  Filled 2020-10-11: qty 0.5

## 2020-10-11 MED ORDER — POLYVINYL ALCOHOL 1.4 % OP SOLN: 1.0000 [drp] | Freq: Two times a day (BID) | OPHTHALMIC | Status: DC | PRN

## 2020-10-11 MED ORDER — IPRATROPIUM-ALBUTEROL 0.5-2.5 (3) MG/3ML IN SOLN: 3.0000 mL | Freq: Four times a day (QID) | RESPIRATORY_TRACT | Status: DC | PRN

## 2020-10-11 MED ORDER — SODIUM CHLORIDE 0.9 % IV SOLN: INTRAVENOUS | Status: DC

## 2020-10-11 MED ORDER — ACETAMINOPHEN 500 MG PO TABS: 500.0000 mg | ORAL_TABLET | Freq: Four times a day (QID) | ORAL | Status: DC | PRN

## 2020-10-11 MED ORDER — INSULIN GLARGINE 100 UNIT/ML ~~LOC~~ SOLN
8.0000 [IU] | Freq: Every day | SUBCUTANEOUS | Status: DC
  Administered 2020-10-11 – 2020-10-13 (×3): 8 [IU] via SUBCUTANEOUS
  Filled 2020-10-11 (×3): qty 0.08

## 2020-10-11 MED ORDER — AMLODIPINE BESYLATE 5 MG PO TABS
5.0000 mg | ORAL_TABLET | Freq: Every day | ORAL | Status: DC
  Administered 2020-10-11 – 2020-10-14 (×3): 5 mg via ORAL
  Filled 2020-10-11 (×3): qty 1

## 2020-10-11 MED ORDER — BENZOCAINE 20 % RE OINT: TOPICAL_OINTMENT | RECTAL | Status: DC | PRN

## 2020-10-11 MED ORDER — PANTOPRAZOLE SODIUM 40 MG IV SOLR: 40.0000 mg | Freq: Two times a day (BID) | INTRAVENOUS | Status: DC

## 2020-10-11 MED ORDER — PANTOPRAZOLE INFUSION (NEW) - SIMPLE MED
8.0000 mg/h | INTRAVENOUS | Status: DC
  Administered 2020-10-11 (×3): 8 mg/h via INTRAVENOUS
  Filled 2020-10-11: qty 100
  Filled 2020-10-11 (×3): qty 80

## 2020-10-11 MED ORDER — DULOXETINE HCL 30 MG PO CPEP
30.0000 mg | ORAL_CAPSULE | Freq: Every day | ORAL | Status: DC
  Administered 2020-10-11 – 2020-10-14 (×4): 30 mg via ORAL
  Filled 2020-10-11 (×5): qty 1

## 2020-10-11 MED ORDER — ARTIFICIAL TEARS OPHTHALMIC OINT
1.0000 "application " | TOPICAL_OINTMENT | Freq: Every day | OPHTHALMIC | Status: DC
  Administered 2020-10-11 – 2020-10-13 (×3): 1 via OPHTHALMIC
  Filled 2020-10-11: qty 3.5

## 2020-10-11 MED ORDER — GABAPENTIN 300 MG PO CAPS
300.0000 mg | ORAL_CAPSULE | Freq: Two times a day (BID) | ORAL | Status: DC
  Administered 2020-10-11 – 2020-10-14 (×8): 300 mg via ORAL
  Filled 2020-10-11 (×8): qty 1

## 2020-10-11 MED ORDER — FUROSEMIDE 10 MG/ML IJ SOLN
40.0000 mg | Freq: Once | INTRAMUSCULAR | Status: AC
  Administered 2020-10-11: 40 mg via INTRAVENOUS
  Filled 2020-10-11: qty 4

## 2020-10-11 MED ORDER — SODIUM CHLORIDE 0.9 % IV SOLN: 10.0000 mL/h | Freq: Once | INTRAVENOUS | Status: DC

## 2020-10-11 MED ORDER — LEVOTHYROXINE SODIUM 25 MCG PO TABS
125.0000 ug | ORAL_TABLET | Freq: Every day | ORAL | Status: DC
  Administered 2020-10-11 – 2020-10-14 (×3): 125 ug via ORAL
  Filled 2020-10-11 (×3): qty 1

## 2020-10-11 MED ORDER — MOMETASONE FURO-FORMOTEROL FUM 200-5 MCG/ACT IN AERO
2.0000 | INHALATION_SPRAY | Freq: Two times a day (BID) | RESPIRATORY_TRACT | Status: DC
  Administered 2020-10-11 – 2020-10-13 (×4): 2 via RESPIRATORY_TRACT
  Filled 2020-10-11: qty 8.8

## 2020-10-11 MED ORDER — LEVALBUTEROL HCL 0.63 MG/3ML IN NEBU: 0.6300 mg | INHALATION_SOLUTION | Freq: Four times a day (QID) | RESPIRATORY_TRACT | Status: DC | PRN

## 2020-10-11 NOTE — H&P (View-Only) (Signed)
Consultation  Referring Provider:     [facility] hospitalists Primary Care Physician:  [name], NP Primary Gastroenterologist:        Scarlette Shorts Reason for Consultation:     GI bleed     Impression / Plan:   Upper GI bleeding in the setting of over anticoagulation, in an elderly woman with a chronic gastrojejunostomy tube due to pyloric stenosis.  gastrojejunostomy ostomy site bleeding treated with silver nitrate on admission.  Warfarin therapy for history of DVT A flutter CHF  Acute blood loss anemia  Peripheral vascular disease with left great toe/foot ulceration, prior to admission was scheduled for left lower extremity angiogram with possible intervention via right common femoral artery tomorrow  Undoubtably the over anticoagulation that was found last Friday 3 days ago is a big part of her problem.  She has several ongoing medical issues and she is being actively treated and has a decent quality of life so I think pursuing endoscopic evaluation makes sense.  I suspect at least some if not all of this was stomal bleeding but additional evaluation makes sense.  I have spoken to the patient and her daughter and reviewed the risks benefits and indications and I anticipate doing that tomorrow.  I agree with the current supportive care with PPI, vitamin K and withholding warfarin.  Sort out timing of warfarin resumption and indications etc. after EGD.  I am letting her have clear liquids today as I do not think there would be likely we would intervene today.     I appreciate the opportunity to care for this patient. Gatha Mayer, MD, [facility]   HPI:   Neve Branscomb Keil is a 87 y.o. female with a history of peripheral vascular disease, prior DVT a flutter on warfarin, CHF and chronic pyloric stenosis treated with a gastrojejunostomy tube who presents with bleeding around the gastro jejunostomy tube site, with a recent finding of INR 7.5 on July 8.  She presented with weakness and  melena and blood in and around the J-tube overnight and was admitted.  She has been weak.  She complains of some rectal discomfort for some time now.  No bowel habit changes.  Nursing staff reports that the gastrostomy bandages were soaked with blood and were changed, apparently she had some bleeding around the stoma that was treated with silver nitrate in the past week.  The [place] tube was changed on July 5.  The balloon had ruptured. When her INR was discovered to be high she was instructed to hold her warfarin which she did.  INR was 2.9 last night when she came in.  She is feeling better now after having some blood transfusion.  She does tolerate clear liquids she is not having any abdominal pain.  Again she has been weak and somewhat dyspneic.  She ambulates with a walker at home.  She has been evaluated by vascular surgery Dr. Stanford Breed on July 5 because of a left great toe ulcer and there were plans to do angiography and possible intervention tomorrow.  Last EGD was in 2017 by Dr. Havery Moros, she had NG tube trauma/esophagitis and a pyloric stenosis dilated to 15 mm.  She had an open gastrojejunostomy after that. Past Medical History:  Diagnosis Date   Acute respiratory failure ([place])    Anemia    previous blood transfusions   Arthritis    "all over"   Asthma    Atrial flutter (HCC)    Bradycardia  requiring previous d/c of BB and reduction of amiodarone   CAD (coronary artery disease)    nonobstructive per notes   Chronic diastolic CHF (congestive heart failure) ([place])    CKD (chronic kidney disease), stage III ([place])    Complication of blood transfusion    "got the wrong blood type at [facility] in ~ 2015; no adverse reaction that we are aware of"/daughter, [name] (01/27/2016)   COPD (chronic obstructive pulmonary disease) ([place])    Depression    "light case"   DVT (deep venous thrombosis) ([place]) 01/2016   a. LLE DVT 01/2016 - switched from Eliquis to Coumadin.   Dyspnea    with some  activity   Gastric stenosis    a. s/p stomach tube   GERD (gastroesophageal reflux disease)    Headache    History of 2019 novel coronavirus disease (COVID-19)    History of blood transfusion    "several" (01/27/2016)   History of stomach ulcers    Hyperlipidemia    Hypertension    Hypothyroidism    On home oxygen therapy    "2L; 9PM til 9AM" (06/27/2017)   PAD (peripheral artery disease) (HCC)    a. prior gangrene, toe amputations, intervention.   PAF (paroxysmal atrial fibrillation) ([place])    Paraesophageal hernia    Perforated gastric ulcer ([place])    Pneumonia    "a few times" (06/27/2017)   Seasonal allergies    SIADH (syndrome of inappropriate ADH production) ([place])    [name] 01/10/2015   Small bowel obstruction ([place])    "I don't know how many" (01/11/2015)   Stroke ([place])    several- no residual   Type II diabetes mellitus ([place])    "related to prednisone use  for > 20 yr; once predinose stopped; no more DM RX" (01/27/2016)   UTI (urinary tract infection) 02/08/2016   Ventral hernia with bowel obstruction     Past Surgical History:  Procedure Laterality Date   ABDOMINAL AORTOGRAM N/A 09/21/2016   Procedure: Abdominal Aortogram;  Surgeon: Waynetta Sandy, MD;  Location: [place] [facility];  Service: Cardiovascular;  Laterality: N/A;   AMPUTATION Left 09/25/2016   Procedure: AMPUTATION DIGIT- LEFT 2ND AND 3RD TOES;  Surgeon: Rosetta Posner, MD;  [facility];  Service: Vascular;  Laterality: Left;   AMPUTATION Right 05/03/2018   Procedure: AMPUTATION RIGHT GREAT TOE;  Surgeon: Rosetta Posner, MD;  [facility]: [facility];  Service: Vascular;  Laterality: Right;   CATARACT EXTRACTION W/ INTRAOCULAR LENS  IMPLANT, BILATERAL     CHOLECYSTECTOMY OPEN     COLECTOMY     ESOPHAGOGASTRODUODENOSCOPY N/A 01/19/2014   Procedure: ESOPHAGOGASTRODUODENOSCOPY (EGD);  Surgeon: Irene Shipper, MD;  Location: [facility];  Service: Endoscopy;  Laterality: N/A;   ESOPHAGOGASTRODUODENOSCOPY N/A  01/20/2014   Procedure: ESOPHAGOGASTRODUODENOSCOPY (EGD);  Surgeon: Irene Shipper, MD;  Location: [facility];  Service: Endoscopy;  Laterality: N/A;   ESOPHAGOGASTRODUODENOSCOPY N/A 03/19/2014   Procedure: ESOPHAGOGASTRODUODENOSCOPY (EGD);  Surgeon: Milus Banister, MD;  Location: [facility];  Service: Endoscopy;  Laterality: N/A;   ESOPHAGOGASTRODUODENOSCOPY N/A 07/08/2015   Procedure: ESOPHAGOGASTRODUODENOSCOPY (EGD);  Surgeon: Doran Stabler, MD;  Location: [facility] ENDOSCOPY;  Service: Endoscopy;  Laterality: N/A;   ESOPHAGOGASTRODUODENOSCOPY (EGD) WITH PROPOFOL N/A 09/15/2015   Procedure: ESOPHAGOGASTRODUODENOSCOPY (EGD) WITH PROPOFOL;  Surgeon: Manus Gunning, MD;  Location: [facility];  Service: Gastroenterology;  Laterality: N/A;   GASTROJEJUNOSTOMY     hx/notes 01/10/2015   GASTROJEJUNOSTOMY N/A 09/23/2015  Procedure: OPEN GASTROJEJUNOSTOMY TUBE PLACEMENT;  Surgeon: Arta Bruce Kinsinger, MD;  Location: [facility];  Service: General;  Laterality: N/A;   GLAUCOMA SURGERY Bilateral    HERNIA REPAIR  2015   IR CM INJ ANY COLONIC TUBE W/FLUORO  01/05/2017   IR CM INJ ANY COLONIC TUBE W/FLUORO  06/07/2017   IR CM INJ ANY COLONIC TUBE W/FLUORO  11/05/2017   IR CM INJ ANY COLONIC TUBE W/FLUORO  09/18/2018   IR CM INJ ANY COLONIC TUBE W/FLUORO  03/06/2019   IR CM INJ ANY COLONIC TUBE W/FLUORO  03/11/2019   IR GASTR TUBE CONVERT GASTR-JEJ PER W/FL MOD SED  04/30/2020   IR GENERIC HISTORICAL  01/07/2016   IR GJ TUBE CHANGE 01/07/2016 Jacqulynn Cadet, MD [facility]   IR GENERIC HISTORICAL  01/27/2016   IR MECH REMOV OBSTRUC MAT ANY COLON TUBE W/FLUORO 01/27/2016 Markus Daft, MD [facility]   IR GENERIC HISTORICAL  02/07/2016   IR PATIENT EVAL TECH 0-60 MINS Darrell K Allred, PA-C [facility]   IR GENERIC HISTORICAL  02/08/2016   IR GJ TUBE CHANGE 02/08/2016 Greggory Keen, MD [facility]   IR GENERIC HISTORICAL  01/06/2016   IR GJ TUBE CHANGE 01/06/2016 [facility] OUT REF   IR GENERIC HISTORICAL   05/02/2016   IR CM INJ ANY COLONIC TUBE W/FLUORO 05/02/2016 Arne Cleveland, MD [facility]   IR GENERIC HISTORICAL  05/15/2016   IR GJ TUBE CHANGE 05/15/2016 Sandi Mariscal, MD [facility]   IR GENERIC HISTORICAL  06/28/2016   IR GJ TUBE CHANGE 06/28/2016 [facility]   IR GJ TUBE CHANGE  02/20/2017   IR GJ TUBE CHANGE  05/10/2017   IR GJ TUBE CHANGE  07/06/2017   IR GJ TUBE CHANGE  08/02/2017   IR GJ TUBE CHANGE  10/15/2017   IR GJ TUBE CHANGE  12/26/2017   IR GJ TUBE CHANGE  04/08/2018   IR GJ TUBE CHANGE  06/19/2018   IR GJ TUBE CHANGE  03/03/2019   IR GJ TUBE CHANGE  06/06/2019   IR GJ TUBE CHANGE  09/26/2019   IR GJ TUBE CHANGE  01/09/2020   IR GJ TUBE CHANGE  05/12/2020   IR GJ TUBE CHANGE  10/05/2020   IR PATIENT EVAL TECH 0-60 MINS  10/19/2016   IR PATIENT EVAL TECH 0-60 MINS  12/25/2016   IR PATIENT EVAL TECH 0-60 MINS  01/29/2017   IR PATIENT EVAL TECH 0-60 MINS  04/04/2017   IR PATIENT EVAL TECH 0-60 MINS  04/30/2017   IR PATIENT EVAL TECH 0-60 MINS  07/31/2017   IR REPLC DUODEN/JEJUNO TUBE PERCUT W/FLUORO  11/14/2016   LAPAROTOMY N/A 01/20/2015   Procedure: EXPLORATORY LAPAROTOMY;  Surgeon: Coralie Keens, MD;  Location: [facility];  Service: General;  Laterality: N/A;   LOWER EXTREMITY ANGIOGRAPHY Left 09/21/2016   Procedure: Lower Extremity Angiography;  Surgeon: Waynetta Sandy, MD;  Location: [place] [facility];  Service: Cardiovascular;  Laterality: Left;   LOWER EXTREMITY ANGIOGRAPHY Right 06/27/2017   Procedure: Lower Extremity Angiography;  Surgeon: Waynetta Sandy, MD;  Location: [place] [facility];  Service: Cardiovascular;  Laterality: Right;   LYSIS OF ADHESION N/A 01/20/2015   Procedure: LYSIS OF ADHESIONS < 1 HOUR;  Surgeon: Coralie Keens, MD;  Location: [facility];  Service: General;  Laterality: N/A;   PERIPHERAL VASCULAR BALLOON ANGIOPLASTY Left 09/21/2016   Procedure: Peripheral Vascular Balloon Angioplasty;  Surgeon: Waynetta Sandy, MD;  Location: [facility] [facility];  Service:  Cardiovascular;  Laterality: Left;  drug coated balloon   PERIPHERAL VASCULAR BALLOON ANGIOPLASTY Right 06/27/2017   Procedure: PERIPHERAL VASCULAR BALLOON ANGIOPLASTY;  Surgeon: Waynetta Sandy, MD;  Location: [facility];  Service: Cardiovascular;  Laterality: Right;  [id number]   TONSILLECTOMY     TRANSMETATARSAL AMPUTATION Right 11/14/2019   Procedure: TRANSMETATARSAL AMPUTATION RIGHT FOOT;  Surgeon: Edrick Kins, DPM;  Location: [facility];  Service: Podiatry;  Laterality: Right;   TUBAL LIGATION     VENTRAL HERNIA REPAIR  2015   incarcerated ventral hernia ([facility] 09/2013)/notes 01/10/2015    Family History  Problem Relation Age of Onset   Stroke Mother    Hypertension Mother    Diabetes Brother    Glaucoma Son    Glaucoma Son    Heart attack Neg Hx    Colon cancer Neg Hx    Stomach cancer Neg Hx      Social History   Tobacco Use   Smoking status: Never   Smokeless tobacco: Former    Types: Snuff    Quit date: 04/03/1980   Tobacco comments:    "used snuff in my younger days"  Vaping Use   Vaping Use: Never used  Substance Use Topics   Alcohol use: Never    Alcohol/week: 0.0 standard drinks   Drug use: Never    Prior to Admission medications   Medication Sig Start Date End Date Taking? Authorizing Provider  acetaminophen (TYLENOL) 500 MG tablet Take 500 mg by mouth every 6 (six) hours as needed for moderate pain.   Yes [provider]  amLODipine (NORVASC) 5 MG tablet Take 1 tablet (5 mg total) by mouth daily. 10/20/19 10/19/20 Yes Medina-Vargas, Monina C, NP  atorvastatin (LIPITOR) 10 MG tablet TAKE 1 TABLET BY MOUTH EVERY DAY Patient taking differently: Take 10 mg by mouth daily. 06/24/20  Yes [name], NP  b complex vitamins tablet Take 1 tablet by mouth daily.    Yes [provider]  benzocaine (AMERICAINE) 20 % rectal ointment Place rectally every 3 (three) hours as needed for pain. 09/24/19  Yes Fawze,  Mina A, PA-C  cholecalciferol (VITAMIN D3) 25 MCG (1000 UNIT) tablet Take 1,000 Units by mouth daily.   Yes [provider]  cycloSPORINE (RESTASIS) 0.05 % ophthalmic emulsion USE 1 DROP INTO BOTH EYES TWICE DAILY Patient taking differently: Place 1 drop into both eyes 2 (two) times daily. 01/29/17  Yes Eulas Post, Monica, DO  DULoxetine (CYMBALTA) 30 MG capsule TAKE 1 CAPSULE BY MOUTH EVERY DAY Patient taking differently: Take 30 mg by mouth daily. 05/05/20  Yes [name], NP  ferrous sulfate 220 (44 Fe) MG/5ML solution TAKE 7.4 MLS (325 MG TOTAL) BY MOUTH DAILY. 03/04/20  Yes [name], NP  fluticasone (FLONASE) 50 MCG/ACT nasal spray SPRAY 2 SPRAYS INTO EACH NOSTRIL EVERY DAY Patient taking differently: Place 2 sprays into both nostrils in the morning and at bedtime. 06/25/19  Yes [name], NP  furosemide (LASIX) 40 MG tablet TAKE 0.5-1 TABLETS (20-40 MG TOTAL) BY MOUTH SEE ADMIN INSTRUCTIONS. TAKE 20 MG DAILY, MAY INSTEAD TAKE 40 MG DAILY AS NEEDED FOR EDEMA 08/04/20  Yes Freada Bergeron, MD  gabapentin (NEURONTIN) 300 MG capsule Take 1 capsule (300 mg total) by mouth 2 (two) times daily. 05/17/20  Yes [name], NP  gentamicin cream (GARAMYCIN) 0.1 % Apply 1 application topically daily as needed (dry skin). 10/01/19  Yes Edrick Kins, DPM  hydrALAZINE (APRESOLINE)  50 MG tablet TAKE 1 TABLET BY MOUTH THREE TIMES A DAY Patient taking differently: Take 50 mg by mouth 3 (three) times daily. 04/26/20  Yes Dorothy Spark, MD  insulin aspart (NOVOLOG) 100 UNIT/ML FlexPen Inject 2-6 Units into the skin as needed for high blood sugar. New sliding Scale If BS is 151 - 251 = 2 units,  If BS is 251 -  350 = 4 units,  If BS is 351 - 450 = 6 units Call provider if BS runs more than 400   Yes [provider]  Insulin Pen Needle (B-D UF III MINI PEN NEEDLES) 31G X 5 MM MISC Use twice daily with giving insulin injections. Dx E11.9 07/17/18  Yes [name], NP  ipratropium-albuterol (DUONEB) 0.5-2.5 (3) MG/3ML SOLN Take 3 mLs by nebulization every 6 (six) hours as needed (shortness of breath).   Yes [provider]  isosorbide mononitrate (IMDUR) 30 MG 24 hr tablet TAKE 2 TABLETS BY MOUTH DAILY. Patient taking differently: Take 60 mg by mouth in the morning. 09/20/20  Yes Pemberton, Greer Ee, MD  LANTUS SOLOSTAR 100 UNIT/ML Solostar Pen INJECT 8 UNITS INTO THE SKIN AT BEDTIME. Patient taking differently: Inject 8 Units into the skin at bedtime as needed (blood sugar 150 or above). 05/10/20  Yes [name], NP  latanoprost (XALATAN) 0.005 % ophthalmic solution Place 1 drop into both eyes at bedtime.   Yes [provider]  levalbuterol (XOPENEX HFA) 45 MCG/ACT inhaler Inhale 2 puffs into the lungs every 6 (six) hours as needed for wheezing.   Yes [provider]  levothyroxine (SYNTHROID) 125 MCG tablet TAKE 1 TABLET BY MOUTH EVERY DAY Patient taking differently: Take 125 mcg by mouth daily before breakfast. 06/24/20  Yes Eubanks, [name], NP  Lidocaine-Glycerin (PREPARATION H EX) Apply 1 application topically 2 (two) times daily as needed (hemorrhoids).   Yes [provider]  loratadine (CLARITIN) 10 MG tablet Take 10 mg by mouth daily.   Yes [provider]  mometasone-formoterol (DULERA) 200-5 MCG/ACT AERO INHALE TWO PUFFS BY MOUTH TWICE DAILY Patient taking differently: Inhale 2 puffs into the lungs in the morning and at bedtime. 10/05/20  Yes [name], NP  Multiple Vitamin (MULTIVITAMIN WITH MINERALS) TABS tablet Take 1 tablet by mouth daily.   Yes [provider]  Nutritional Supplements (FEEDING SUPPLEMENT, OSMOLITE 1.5 CAL,) LIQD Place 711 mLs into feeding tube See admin instructions. Take 711 ml over a 12 hour period per day 9 am-9 pm at a rate of 65   Yes [provider]  nystatin (MYCOSTATIN) 100000 UNIT/ML suspension Take 5 mLs (500,000 Units total) by mouth 4  (four) times daily. Patient taking differently: Take 5 mLs by mouth 4 (four) times daily as needed (thrush). 09/06/20  Yes [name], NP  nystatin (MYCOSTATIN/NYSTOP) powder Apply 1 application topically as needed (skin irritation). 09/11/19  Yes Ngetich, Dinah C, NP  nystatin cream (MYCOSTATIN) APPLY TO AFFECTED AREA TWICE A DAY Patient taking differently: Apply 1 application topically at bedtime. 05/10/20  Yes [name], NP  oxyCODONE (ROXICODONE) 5 MG immediate release tablet Take 0.5-1 tablets (2.5-5 mg total) by mouth every 8 (eight) hours as needed for severe pain. 10/05/20  Yes [name], NP  pantoprazole (PROTONIX) 40 MG tablet Take 1 tablet (40 mg total) by mouth daily. Patient taking differently: Take 40 mg by mouth daily with supper. 09/23/20  Yes Freada Bergeron, MD  Polyethyl Glycol-Propyl  Glycol (SYSTANE) 0.4-0.3 % GEL ophthalmic gel Place 1 application into both eyes 2 (two) times daily as needed (moisture).   Yes [provider]  Probiotic Product (EQL PROBIOTIC COLON SUPPORT PO) Take 1 capsule by mouth daily.   Yes [provider]  senna-docusate (SENOKOT-S) 8.6-50 MG tablet Take 1 tablet by mouth as needed for mild constipation.   Yes [provider]  shark liver oil-cocoa butter (PREPARATION H) 0.25-3-85.5 % suppository Place 1 suppository rectally as needed for hemorrhoids.   Yes [provider]  warfarin (COUMADIN) 2.5 MG tablet TAKE 2-3 TABLETS BY MOUTH DAILY AS DIRECTED BY COUMADIN CLINIC. Patient taking differently: Take 5-7.5 mg by mouth See admin instructions. Take 5 mg by mouth on Monday, Tuesday, Wednesday, Friday and Satuday, take 7.5 mg on Sunday and Thursday 05/18/20  Yes Dorothy Spark, MD  Water For Irrigation, Sterile (FREE WATER) SOLN Place 180 mLs into feeding tube 3 (three) times daily.   Yes [provider]  White Petrolatum-Mineral Oil (SYSTANE NIGHTTIME) OINT Place 1 application into both  eyes at bedtime.    Yes [provider]    Current Facility-Administered Medications  Medication Dose Route Frequency Provider Last Rate Last Admin   0.9 %  sodium chloride infusion   Intravenous Continuous Nita Sells, MD 50 mL/hr at 10/11/20 0833 New Bag at 10/11/20 0833   acetaminophen (TYLENOL) tablet 500 mg  500 mg Oral Q6H PRN Norins, Heinz Knuckles, MD       amLODipine (NORVASC) tablet 5 mg  5 mg Oral Daily Norins, Heinz Knuckles, MD       artificial tears (LACRILUBE) ophthalmic ointment 1 application  1 application Both Eyes QHS Norins, Heinz Knuckles, MD       cycloSPORINE (RESTASIS) 0.05 % ophthalmic emulsion 1 drop  1 drop Both Eyes BID Norins, Heinz Knuckles, MD       DULoxetine (CYMBALTA) DR capsule 30 mg  30 mg Oral Daily Norins, Heinz Knuckles, MD       free water 180 mL  180 mL Per Tube TID Norins, Heinz Knuckles, MD       furosemide (LASIX) tablet 20 mg  20 mg Oral Daily Norins, Heinz Knuckles, MD       furosemide (LASIX) tablet 20 mg  20 mg Oral Daily PRN Palumbo, April, MD       gabapentin (NEURONTIN) capsule 300 mg  300 mg Oral BID Norins, Heinz Knuckles, MD   300 mg at 10/11/20 0257   hydrALAZINE (APRESOLINE) tablet 50 mg  50 mg Oral TID Norins, Heinz Knuckles, MD       insulin glargine (LANTUS) injection 8 Units  8 Units Subcutaneous QHS Norins, Heinz Knuckles, MD       ipratropium-albuterol (DUONEB) 0.5-2.5 (3) MG/3ML nebulizer solution 3 mL  3 mL Nebulization Q6H PRN Norins, Heinz Knuckles, MD       isosorbide mononitrate (IMDUR) 24 hr tablet 60 mg  60 mg Oral q AM Norins, Heinz Knuckles, MD   60 mg at 10/11/20 0900   latanoprost (XALATAN) 0.005 % ophthalmic solution 1 drop  1 drop Both Eyes QHS Norins, Heinz Knuckles, MD       levalbuterol (XOPENEX) nebulizer solution 0.63 mg  0.63 mg Inhalation Q6H PRN Norins, Heinz Knuckles, MD       levothyroxine (SYNTHROID) tablet 125 mcg  125 mcg Oral Q0600 Neena Rhymes, MD   125 mcg at 10/11/20 0900   mometasone-formoterol (DULERA) 200-5 MCG/ACT inhaler 2 puff  2 puff  Inhalation BID Neena Rhymes, MD   2 puff at 10/11/20 0857   nystatin (MYCOSTATIN) 100000 UNIT/ML suspension 500,000 Units  5 mL Oral QID PRN Norins, Heinz Knuckles, MD       nystatin (MYCOSTATIN/NYSTOP) topical powder 1 application  1 application Topical PRN Norins, Heinz Knuckles, MD       oxyCODONE (Oxy IR/ROXICODONE) immediate release tablet 2.5-5 mg  2.5-5 mg Oral Q8H PRN Norins, Heinz Knuckles, MD       [START ON 10/14/2020] pantoprazole (PROTONIX) injection 40 mg  40 mg Intravenous Q12H Norins, Heinz Knuckles, MD       pantoprozole (PROTONIX) 80 mg /NS 100 mL infusion  8 mg/hr Intravenous Continuous Norins, Heinz Knuckles, MD 10 mL/hr at 10/11/20 0339 8 mg/hr at 10/11/20 6761   polyvinyl alcohol (LIQUIFILM TEARS) 1.4 % ophthalmic solution 1 drop  1 drop Both Eyes BID PRN Norins, Heinz Knuckles, MD       Current Outpatient Medications  Medication Sig Dispense Refill   acetaminophen (TYLENOL) 500 MG tablet Take 500 mg by mouth every 6 (six) hours as needed for moderate pain.     amLODipine (NORVASC) 5 MG tablet Take 1 tablet (5 mg total) by mouth daily. 30 tablet 11   atorvastatin (LIPITOR) 10 MG tablet TAKE 1 TABLET BY MOUTH EVERY DAY (Patient taking differently: Take 10 mg by mouth daily.) 90 tablet 0   b complex vitamins tablet Take 1 tablet by mouth daily.      benzocaine (AMERICAINE) 20 % rectal ointment Place rectally every 3 (three) hours as needed for pain. 28.4 g 0   cholecalciferol (VITAMIN D3) 25 MCG (1000 UNIT) tablet Take 1,000 Units by mouth daily.     cycloSPORINE (RESTASIS) 0.05 % ophthalmic emulsion USE 1 DROP INTO BOTH EYES TWICE DAILY (Patient taking differently: Place 1 drop into both eyes 2 (two) times daily.) 60 mL 3   DULoxetine (CYMBALTA) 30 MG capsule TAKE 1 CAPSULE BY MOUTH EVERY DAY (Patient taking differently: Take 30 mg by mouth daily.) 90 capsule 2   ferrous sulfate 220 (44 Fe) MG/5ML solution TAKE 7.4 MLS (325 MG TOTAL) BY MOUTH DAILY. 150 mL 11   fluticasone (FLONASE) 50 MCG/ACT nasal  spray SPRAY 2 SPRAYS INTO EACH NOSTRIL EVERY DAY (Patient taking differently: Place 2 sprays into both nostrils in the morning and at bedtime.) 48 mL 1   furosemide (LASIX) 40 MG tablet TAKE 0.5-1 TABLETS (20-40 MG TOTAL) BY MOUTH SEE ADMIN INSTRUCTIONS. TAKE 20 MG DAILY, MAY INSTEAD TAKE 40 MG DAILY AS NEEDED FOR EDEMA 30 tablet 8   gabapentin (NEURONTIN) 300 MG capsule Take 1 capsule (300 mg total) by mouth 2 (two) times daily. 180 capsule 1   gentamicin cream (GARAMYCIN) 0.1 % Apply 1 application topically daily as needed (dry skin). 30 g 1   hydrALAZINE (APRESOLINE) 50 MG tablet TAKE 1 TABLET BY MOUTH THREE TIMES A DAY (Patient taking differently: Take 50 mg by mouth 3 (three) times daily.) 270 tablet 2   insulin aspart (NOVOLOG) 100 UNIT/ML FlexPen Inject 2-6 Units into the skin as needed for high blood sugar. New sliding Scale If BS is 151 - 251 = 2 units,  If BS is 251 -  350 = 4 units,  If BS is 351 - 450 = 6 units Call provider if BS runs more than 400     Insulin Pen Needle (B-D UF III MINI PEN NEEDLES) 31G X 5 MM MISC Use twice daily with giving insulin injections. Dx  E11.9 300 each 3   ipratropium-albuterol (DUONEB) 0.5-2.5 (3) MG/3ML SOLN Take 3 mLs by nebulization every 6 (six) hours as needed (shortness of breath).     isosorbide mononitrate (IMDUR) 30 MG 24 hr tablet TAKE 2 TABLETS BY MOUTH DAILY. (Patient taking differently: Take 60 mg by mouth in the morning.) 180 tablet 2   LANTUS SOLOSTAR 100 UNIT/ML Solostar Pen INJECT 8 UNITS INTO THE SKIN AT BEDTIME. (Patient taking differently: Inject 8 Units into the skin at bedtime as needed (blood sugar 150 or above).) 3 mL 3   latanoprost (XALATAN) 0.005 % ophthalmic solution Place 1 drop into both eyes at bedtime.     levalbuterol (XOPENEX HFA) 45 MCG/ACT inhaler Inhale 2 puffs into the lungs every 6 (six) hours as needed for wheezing.     levothyroxine (SYNTHROID) 125 MCG tablet TAKE 1 TABLET BY MOUTH EVERY DAY (Patient taking differently:  Take 125 mcg by mouth daily before breakfast.) 90 tablet 0   Lidocaine-Glycerin (PREPARATION H EX) Apply 1 application topically 2 (two) times daily as needed (hemorrhoids).     loratadine (CLARITIN) 10 MG tablet Take 10 mg by mouth daily.     mometasone-formoterol (DULERA) 200-5 MCG/ACT AERO INHALE TWO PUFFS BY MOUTH TWICE DAILY (Patient taking differently: Inhale 2 puffs into the lungs in the morning and at bedtime.) 13 each 5   Multiple Vitamin (MULTIVITAMIN WITH MINERALS) TABS tablet Take 1 tablet by mouth daily.     Nutritional Supplements (FEEDING SUPPLEMENT, OSMOLITE 1.5 CAL,) LIQD Place 711 mLs into feeding tube See admin instructions. Take 711 ml over a 12 hour period per day 9 am-9 pm at a rate of 65     nystatin (MYCOSTATIN) 100000 UNIT/ML suspension Take 5 mLs (500,000 Units total) by mouth 4 (four) times daily. (Patient taking differently: Take 5 mLs by mouth 4 (four) times daily as needed (thrush).) 473 mL 0   nystatin (MYCOSTATIN/NYSTOP) powder Apply 1 application topically as needed (skin irritation). 15 g 0   nystatin cream (MYCOSTATIN) APPLY TO AFFECTED AREA TWICE A DAY (Patient taking differently: Apply 1 application topically at bedtime.) 30 g 11   oxyCODONE (ROXICODONE) 5 MG immediate release tablet Take 0.5-1 tablets (2.5-5 mg total) by mouth every 8 (eight) hours as needed for severe pain. 30 tablet 0   pantoprazole (PROTONIX) 40 MG tablet Take 1 tablet (40 mg total) by mouth daily. (Patient taking differently: Take 40 mg by mouth daily with supper.) 90 tablet 2   Polyethyl Glycol-Propyl Glycol (SYSTANE) 0.4-0.3 % GEL ophthalmic gel Place 1 application into both eyes 2 (two) times daily as needed (moisture).     Probiotic Product (EQL PROBIOTIC COLON SUPPORT PO) Take 1 capsule by mouth daily.     senna-docusate (SENOKOT-S) 8.6-50 MG tablet Take 1 tablet by mouth as needed for mild constipation.     shark liver oil-cocoa butter (PREPARATION H) 0.25-3-85.5 % suppository Place 1  suppository rectally as needed for hemorrhoids.     warfarin (COUMADIN) 2.5 MG tablet TAKE 2-3 TABLETS BY MOUTH DAILY AS DIRECTED BY [facility] CL[facility]IC. (Patient taking differently: Take 5-7.5 mg by mouth See admin instructions. Take 5 mg by mouth on Monday, Tuesday, Wednesday, Friday and Satuday, take 7.5 mg on Sunday and Thursday) 215 tablet 1   Water For Irrigation, Sterile (FREE WATER) SOLN Place 180 mLs into feeding tube 3 (three) times daily.     White Petrolatum-Mineral Oil (SYSTANE NIGHTTIME) OINT Place 1 application into both eyes at bedtime.  Allergies as of 10/10/2020 - Review Complete 10/10/2020  Allergen Reaction Noted   Penicillins Itching, Rash, and Other (See Comments) 02/12/2016     Review of Systems:    This is positive for those things mentioned in the HPI. All other review of systems are negative.       Physical Exam:  Vital signs in last 24 hours: Temp:  [97.5 F (36.4 C)-98.1 F (36.7 C)] 98 F (36.7 C) (07/11 0913) Pulse Rate:  [64-75] 71 (07/11 0913) Resp:  [9-29] 22 (07/11 0913) BP: (128-179)/(48-75) 152/64 (07/11 0913) SpO2:  [90 %-100 %] 100 % (07/11 0913) Weight:  [54.4 kg] 54.4 kg (07/10 2222)    General:  Elderly alert younger than stated age black woman n no acute distress Eyes:  anicteric.  Pale conjunctiva ENT:   Mouth and posterior pharynx free of lesions.  Edentulous  Lungs: Clear to auscultation bilaterally.  Anterior Heart:   S1S2, no rubs, murmurs, gallops. Abdomen: Thin soft, non-tender, no hepatosplenomegaly,  and BS+.  Easily reducible midline ventral hernia status postrepair there is a gastrostomy tube with a jejunostomy tube through it in the left upper quadrant it is bandaged the bandages are clean.  There is dark red bloody fluid in the tube. Rectal: In the presence of female nursing staff rectal exam shows a normal anoderm.  Slightly tender no mass there is scanty maroon stool.  Neuro:  A&O x 3.  Psych:  appropriate mood and   Affect.   Data Reviewed:   LAB RESULTS: Recent Labs    10/10/20 2224  WBC 4.9  HGB 6.9*  HCT 21.2*  PLT 226   BMET Recent Labs    10/10/20 2224  NA 135  K 4.7  CL 103  CO2 22  GLUCOSE 120*  BUN 77*  CREATININE 1.76*  CALCIUM 8.8*   LFT  PT/INR Recent Labs    10/10/20 0356 10/10/20 2224  LABPROT 33.7* 30.0*  INR 3.3* 2.9*      PREVIOUS ENDOSCOPIES:            See HPI   Thanks   LOS: 0 days   @Adriell Polansky  Simonne Maffucci, MD, [facility] @  10/11/2020, 9:33 AM

## 2020-10-11 NOTE — H&P (Signed)
History and Physical    Shannon [name] YQM:[id number] DOB: [birth year] DOA: 10/10/2020  PCP: Sharon Seller, NP Home Patient coming from: home  I have personally briefly reviewed patient's old medical records in [facility]  Chief Complaint: blood in J-tube, blood in stool  HPI: Shannon [name] is a 87 y.o. female with medical history significant of multiple medical problems as listed in PMH. She had replacement of J-tube 10/05/20 by IR.  She was seen 10/08/20, 10/09/20 in ED for bleeding at PEG site which was cauterized. Subsequently blood was not in the J-tube and blood noted with bowel movements. [facility] for furhter evaluation. (  ED Course: T 97.8  141/50  HR 64  RR 18  Glucose 120  BUN/Cr 77/1.76 ( 61/1.53 on 6/23; 52/1.46 on 5/27). Hgb 6.9 g - 9.5 on 5/27, 8.8 on 7/1. INR  6.7 on 7/8, 3.3 7/10@0400 , 2.9 7/10 @ 2234. Patient was administered Vit K IV. She was typed and cross matched and 2u PRBCs transfusion ordered. TRH called to admit to continue evaluation and treatment.   Review of Systems: As per HPI otherwise 10 point review of systems negative.    Past Medical History:  Diagnosis Date   Acute respiratory failure (HCC)    Anemia    previous blood transfusions   Arthritis    "all over"   Asthma    Atrial flutter (HCC)    Bradycardia    requiring previous d/c of BB and reduction of amiodarone   CAD (coronary artery disease)    nonobstructive per notes   Chronic diastolic CHF (congestive heart failure) (HCC)    CKD (chronic kidney disease), stage III (HCC)    Complication of blood transfusion    "got the wrong blood type at [facility] in ~ 2015; no adverse reaction that we are aware of"/daughter, [name] (01/27/2016)   COPD (chronic obstructive pulmonary disease) (HCC)    Depression    "light case"   DVT (deep venous thrombosis) (HCC) 01/2016   a. LLE DVT 01/2016 - switched from Eliquis to Coumadin.   Dyspnea    with some activity   Gastric stenosis    a.  s/p stomach tube   GERD (gastroesophageal reflux disease)    Headache    History of 2019 novel coronavirus disease (COVID-19)    History of blood transfusion    "several" (01/27/2016)   History of stomach ulcers    Hyperlipidemia    Hypertension    Hypothyroidism    On home oxygen therapy    "2L; 9PM til 9AM" (06/27/2017)   PAD (peripheral artery disease) (HCC)    a. prior gangrene, toe amputations, intervention.   PAF (paroxysmal atrial fibrillation) (HCC)    Paraesophageal hernia    Perforated gastric ulcer (HCC)    Pneumonia    "a few times" (06/27/2017)   Seasonal allergies    SIADH (syndrome of inappropriate ADH production) (HCC)    [name] 01/10/2015   Small bowel obstruction (HCC)    "I don't know how many" (01/11/2015)   Stroke (HCC)    several- no residual   Type II diabetes mellitus (HCC)    "related to prednisone use  for > 20 yr; once predinose stopped; no more DM RX" (01/27/2016)   UTI (urinary tract infection) 02/08/2016   Ventral hernia with bowel obstruction     Past Surgical History:  Procedure Laterality Date   ABDOMINAL AORTOGRAM N/A 09/21/2016   Procedure: Abdominal Aortogram;  Surgeon: Maeola Harman, MD;  [facility] INVASIVE CV LAB;  Service: Cardiovascular;  Laterality: N/A;   AMPUTATION Left 09/25/2016   Procedure: AMPUTATION DIGIT- LEFT 2ND AND 3RD TOES;  Surgeon: Larina Earthly, MD;  [facility];  Service: Vascular;  Laterality: Left;   AMPUTATION Right 05/03/2018   Procedure: AMPUTATION RIGHT GREAT TOE;  Surgeon: Larina Earthly, MD;  [facility];  Service: Vascular;  Laterality: Right;   CATARACT EXTRACTION W/ INTRAOCULAR LENS  IMPLANT, BILATERAL     CHOLECYSTECTOMY OPEN     COLECTOMY     ESOPHAGOGASTRODUODENOSCOPY N/A 01/19/2014   Procedure: ESOPHAGOGASTRODUODENOSCOPY (EGD);  Surgeon: Hilarie Fredrickson, MD;  Location: [facility] ENDOSCOPY;  Service: Endoscopy;  Laterality: N/A;   ESOPHAGOGASTRODUODENOSCOPY N/A 01/20/2014   Procedure:  ESOPHAGOGASTRODUODENOSCOPY (EGD);  Surgeon: Hilarie Fredrickson, MD;  Location: [facility] ENDOSCOPY;  Service: Endoscopy;  Laterality: N/A;   ESOPHAGOGASTRODUODENOSCOPY N/A 03/19/2014   Procedure: ESOPHAGOGASTRODUODENOSCOPY (EGD);  Surgeon: Rachael Fee, MD;  Location: [facility]OSCOPY;  Service: Endoscopy;  Laterality: N/A;   ESOPHAGOGASTRODUODENOSCOPY N/A 07/08/2015   Procedure: ESOPHAGOGASTRODUODENOSCOPY (EGD);  Surgeon: Sherrilyn Rist, MD;  Location: [facility] ENDOSCOPY;  Service: Endoscopy;  Laterality: N/A;   ESOPHAGOGASTRODUODENOSCOPY (EGD) WITH PROPOFOL N/A 09/15/2015   Procedure: ESOPHAGOGASTRODUODENOSCOPY (EGD) WITH PROPOFOL;  Surgeon: Ruffin Frederick, MD;  Location: [facility] ENDOSCOPY;  Service: Gastroenterology;  Laterality: N/A;   GASTROJEJUNOSTOMY     hx/notes 01/10/2015   GASTROJEJUNOSTOMY N/A 09/23/2015   Procedure: OPEN GASTROJEJUNOSTOMY TUBE PLACEMENT;  Surgeon: De Blanch Kinsinger, MD;  Location: [facility] ORS;  Service: General;  Laterality: N/A;   GLAUCOMA SURGERY Bilateral    HERNIA REPAIR  2015   IR CM INJ ANY COLONIC TUBE W/FLUORO  01/05/2017   IR CM INJ ANY COLONIC TUBE W/FLUORO  06/07/2017   IR CM INJ ANY COLONIC TUBE W/FLUORO  11/05/2017   IR CM INJ ANY COLONIC TUBE W/FLUORO  09/18/2018   IR CM INJ ANY COLONIC TUBE W/FLUORO  03/06/2019   IR CM INJ ANY COLONIC TUBE W/FLUORO  03/11/2019   IR GASTR TUBE CONVERT GASTR-JEJ PER W/FL MOD SED  04/30/2020   IR GENERIC HISTORICAL  01/07/2016   IR GJ TUBE CHANGE 01/07/2016 Malachy Moan, MD [facility]   IR GENERIC HISTORICAL  01/27/2016   IR MECH REMOV OBSTRUC MAT ANY COLON TUBE W/FLUORO 01/27/2016 Richarda Overlie, MD [facility]   IR GENERIC HISTORICAL  02/07/2016   IR PATIENT EVAL TECH 0-60 MINS Darrell K Allred, PA-C [facility]   IR GENERIC HISTORICAL  02/08/2016   IR GJ TUBE CHANGE 02/08/2016 Berdine Dance, MD [facility]   IR GENERIC HISTORICAL  01/06/2016   IR GJ TUBE CHANGE 01/06/2016 [facility] OUT REF   IR GENERIC HISTORICAL  05/02/2016   IR CM INJ ANY  COLONIC TUBE W/FLUORO 05/02/2016 Oley Balm, MD [facility]   IR GENERIC HISTORICAL  05/15/2016   IR GJ TUBE CHANGE 05/15/2016 Simonne Come, MD [facility]   IR GENERIC HISTORICAL  06/28/2016   IR GJ TUBE CHANGE 06/28/2016 [facility]   IR GJ TUBE CHANGE  02/20/2017   IR GJ TUBE CHANGE  05/10/2017   IR GJ TUBE CHANGE  07/06/2017   IR GJ TUBE CHANGE  08/02/2017   IR GJ TUBE CHANGE  10/15/2017   IR GJ TUBE CHANGE  12/26/2017   IR GJ TUBE CHANGE  04/08/2018   IR GJ TUBE CHANGE  06/19/2018   IR GJ TUBE CHANGE  03/03/2019   IR GJ TUBE CHANGE  06/06/2019   IR GJ TUBE CHANGE  09/26/2019   IR GJ TUBE CHANGE  01/09/2020   IR GJ TUBE CHANGE  05/12/2020   IR GJ TUBE CHANGE  10/05/2020   IR PATIENT EVAL TECH 0-60 MINS  10/19/2016   IR PATIENT EVAL TECH 0-60 MINS  12/25/2016   IR PATIENT EVAL TECH 0-60 MINS  01/29/2017   IR PATIENT EVAL TECH 0-60 MINS  04/04/2017   IR PATIENT EVAL TECH 0-60 MINS  04/30/2017   IR PATIENT EVAL TECH 0-60 MINS  07/31/2017   IR REPLC DUODEN/JEJUNO TUBE PERCUT W/FLUORO  11/14/2016   LAPAROTOMY N/A 01/20/2015   Procedure: EXPLORATORY LAPAROTOMY;  Surgeon: Abigail Miyamoto, MD;  [facility];  Service: General;  Laterality: N/A;   LOWER EXTREMITY ANGIOGRAPHY Left 09/21/2016   Procedure: Lower Extremity Angiography;  Surgeon: Maeola Harman, MD;  Location: [facility] INVASIVE CV LAB;  Service: Cardiovascular;  Laterality: Left;   LOWER EXTREMITY ANGIOGRAPHY Right 06/27/2017   Procedure: Lower Extremity Angiography;  Surgeon: Maeola Harman, MD;  [facility] INVASIVE CV LAB;  Service: Cardiovascular;  Laterality: Right;   LYSIS OF ADHESION N/A 01/20/2015   Procedure: LYSIS OF ADHESIONS < 1 HOUR;  Surgeon: Abigail Miyamoto, MD;  [facility];  Service: General;  Laterality: N/A;   PERIPHERAL VASCULAR BALLOON ANGIOPLASTY Left 09/21/2016   Procedure: Peripheral Vascular Balloon Angioplasty;  Surgeon: Maeola Harman, MD;  Location: [facility] INVASIVE CV LAB;  Service:  Cardiovascular;  Laterality: Left;  drug coated balloon   PERIPHERAL VASCULAR BALLOON ANGIOPLASTY Right 06/27/2017   Procedure: PERIPHERAL VASCULAR BALLOON ANGIOPLASTY;  Surgeon: Maeola Harman, MD;  Location: [facility] INVASIVE CV LAB;  Service: Cardiovascular;  Laterality: Right;  SFA/[id number]   TONSILLECTOMY     TRANSMETATARSAL AMPUTATION Right 11/14/2019   Procedure: TRANSMETATARSAL AMPUTATION RIGHT FOOT;  Surgeon: Felecia Shelling, DPM;  [facility];  Service: Podiatry;  Laterality: Right;   TUBAL LIGATION     VENTRAL HERNIA REPAIR  2015   incarcerated ventral hernia ([facility] 09/2013)/notes 01/10/2015   Soc Hx - married 68 yrs then widowed. Three children, 7 grands, many great grands. Lives with daughter. Remains very I-ADLS.   reports that she has never smoked. She quit smokeless tobacco use about 40 years ago.  Her smokeless tobacco use included snuff. She reports that she does not drink alcohol and does not use drugs.  Allergies  Allergen Reactions   Penicillins Itching, Rash and Other (See Comments)    Tolerated amoxicillin, unasyn, zosyn & cephalosporins in the past. Did it involve swelling of the face/tongue/throat, SOB, or low BP? No Did it involve sudden or severe rash/hives, skin peeling, or any reaction on the inside of your mouth or nose? No Did you need to seek medical attention at a hospital or doctor's office? Unknown When did it last happen?      5+ years If all above answers are "NO", may proceed with cephalosporin use.     Family History  Problem Relation Age of Onset   Stroke Mother    Hypertension Mother    Diabetes Brother    Glaucoma Son    Glaucoma Son    Heart attack Neg Hx    Colon cancer Neg Hx    Stomach cancer Neg Hx      Prior to Admission medications   Medication Sig Start Date End Date Taking? Authorizing Provider  acetaminophen (TYLENOL) 500 MG tablet Take 500 mg by mouth every 6 (six) hours as needed for  moderate pain.   Yes [provider]  amLODipine (NORVASC) 5 MG tablet Take 1 tablet (5 mg total) by mouth daily. 10/20/19 10/19/20 Yes Medina-Vargas, Monina C, NP  atorvastatin (LIPITOR) 10 MG tablet TAKE 1 TABLET BY MOUTH EVERY DAY Patient taking differently: Take 10 mg by mouth daily. 06/24/20  Yes Sharon Seller, NP  b complex vitamins tablet Take 1 tablet by mouth daily.    Yes [provider]  benzocaine (AMERICAINE) 20 % rectal ointment Place rectally every 3 (three) hours as needed for pain. 09/24/19  Yes Fawze, Mina A, PA-C  cholecalciferol (VITAMIN D3) 25 MCG (1000 UNIT) tablet Take 1,000 Units by mouth daily.   Yes [provider]  cycloSPORINE (RESTASIS) 0.05 % ophthalmic emulsion USE 1 DROP INTO BOTH EYES TWICE DAILY Patient taking differently: Place 1 drop into both eyes 2 (two) times daily. 01/29/17  Yes Montez Morita, Monica, DO  DULoxetine (CYMBALTA) 30 MG capsule TAKE 1 CAPSULE BY MOUTH EVERY DAY Patient taking differently: Take 30 mg by mouth daily. 05/05/20  Yes Sharon Seller, NP  ferrous sulfate 220 (44 Fe) MG/5ML solution TAKE 7.4 MLS (325 MG TOTAL) BY MOUTH DAILY. 03/04/20  Yes Sharon Seller, NP  fluticasone (FLONASE) 50 MCG/ACT nasal spray SPRAY 2 SPRAYS INTO EACH NOSTRIL EVERY DAY Patient taking differently: Place 2 sprays into both nostrils in the morning and at bedtime. 06/25/19  Yes Sharon Seller, NP  furosemide (LASIX) 40 MG tablet TAKE 0.5-1 TABLETS (20-40 MG TOTAL) BY MOUTH SEE ADMIN INSTRUCTIONS. TAKE 20 MG DAILY, MAY INSTEAD TAKE 40 MG DAILY AS NEEDED FOR EDEMA 08/04/20  Yes Meriam Sprague, MD  gabapentin (NEURONTIN) 300 MG capsule Take 1 capsule (300 mg total) by mouth 2 (two) times daily. 05/17/20  Yes Sharon Seller, NP  gentamicin cream (GARAMYCIN) 0.1 % Apply 1 application topically daily as needed (dry skin). 10/01/19  Yes Felecia Shelling, DPM  hydrALAZINE (APRESOLINE) 50 MG tablet TAKE 1 TABLET BY MOUTH THREE TIMES A DAY Patient taking differently: Take  50 mg by mouth 3 (three) times daily. 04/26/20  Yes Lars Masson, MD  insulin aspart (NOVOLOG) 100 UNIT/ML FlexPen Inject 2-6 Units into the skin as needed for high blood sugar. New sliding Scale If BS is 151 - 251 = 2 units,  If BS is 251 -  350 = 4 units,  If BS is 351 - 450 = 6 units Call provider if BS runs more than 400   Yes [provider]  Insulin Pen Needle (B-D UF III MINI PEN NEEDLES) 31G X 5 MM MISC Use twice daily with giving insulin injections. Dx E11.9 07/17/18  Yes Sharon Seller, NP  ipratropium-albuterol (DUONEB) 0.5-2.5 (3) MG/3ML SOLN Take 3 mLs by nebulization every 6 (six) hours as needed (shortness of breath).   Yes [provider]  isosorbide mononitrate (IMDUR) 30 MG 24 hr tablet TAKE 2 TABLETS BY MOUTH DAILY. Patient taking differently: Take 60 mg by mouth in the morning. 09/20/20  Yes Pemberton, Kathlynn Grate, MD  LANTUS SOLOSTAR 100 UNIT/ML Solostar Pen INJECT 8 UNITS INTO THE SKIN AT BEDTIME. Patient taking differently: Inject 8 Units into the skin at bedtime as needed (blood sugar 150 or above). 05/10/20  Yes Sharon Seller, NP  latanoprost (XALATAN) 0.005 % ophthalmic solution Place 1 drop into both eyes at bedtime.   Yes [provider]  levalbuterol (XOPENEX HFA) 45 MCG/ACT inhaler Inhale 2 puffs into the lungs every 6 (  six) hours as needed for wheezing.   Yes [provider]  levothyroxine (SYNTHROID) 125 MCG tablet TAKE 1 TABLET BY MOUTH EVERY DAY Patient taking differently: Take 125 mcg by mouth daily before breakfast. 06/24/20  Yes Eubanks, Janene Harvey, NP  Lidocaine-Glycerin (PREPARATION H EX) Apply 1 application topically 2 (two) times daily as needed (hemorrhoids).   Yes [provider]  loratadine (CLARITIN) 10 MG tablet Take 10 mg by mouth daily.   Yes [provider]  mometasone-formoterol (DULERA) 200-5 MCG/ACT AERO INHALE TWO PUFFS BY MOUTH TWICE DAILY Patient taking differently: Inhale 2 puffs into  the lungs in the morning and at bedtime. 10/05/20  Yes Sharon Seller, NP  Multiple Vitamin (MULTIVITAMIN WITH MINERALS) TABS tablet Take 1 tablet by mouth daily.   Yes [provider]  Nutritional Supplements (FEEDING SUPPLEMENT, OSMOLITE 1.5 CAL,) LIQD Place 711 mLs into feeding tube See admin instructions. Take 711 ml over a 12 hour period per day 9 am-9 pm at a rate of 65   Yes [provider]  nystatin (MYCOSTATIN) 100000 UNIT/ML suspension Take 5 mLs (500,000 Units total) by mouth 4 (four) times daily. Patient taking differently: Take 5 mLs by mouth 4 (four) times daily as needed (thrush). 09/06/20  Yes Sharon Seller, NP  nystatin (MYCOSTATIN/NYSTOP) powder Apply 1 application topically as needed (skin irritation). 09/11/19  Yes Ngetich, Dinah C, NP  nystatin cream (MYCOSTATIN) APPLY TO AFFECTED AREA TWICE A DAY Patient taking differently: Apply 1 application topically at bedtime. 05/10/20  Yes Sharon Seller, NP  oxyCODONE (ROXICODONE) 5 MG immediate release tablet Take 0.5-1 tablets (2.5-5 mg total) by mouth every 8 (eight) hours as needed for severe pain. 10/05/20  Yes Sharon Seller, NP  pantoprazole (PROTONIX) 40 MG tablet Take 1 tablet (40 mg total) by mouth daily. Patient taking differently: Take 40 mg by mouth daily with supper. 09/23/20  Yes Meriam Sprague, MD  Polyethyl Glycol-Propyl Glycol (SYSTANE) 0.4-0.3 % GEL ophthalmic gel Place 1 application into both eyes 2 (two) times daily as needed (moisture).   Yes [provider]  Probiotic Product (EQL PROBIOTIC COLON SUPPORT PO) Take 1 capsule by mouth daily.   Yes [provider]  senna-docusate (SENOKOT-S) 8.6-50 MG tablet Take 1 tablet by mouth as needed for mild constipation.   Yes [provider]  shark liver oil-cocoa butter (PREPARATION H) 0.25-3-85.5 % suppository Place 1 suppository rectally as needed for hemorrhoids.   Yes [provider]  warfarin (COUMADIN)  2.5 MG tablet TAKE 2-3 TABLETS BY MOUTH DAILY AS DIRECTED BY COUMADIN CLINIC. Patient taking differently: Take 5-7.5 mg by mouth See admin instructions. Take 5 mg by mouth on Monday, Tuesday, Wednesday, Friday and Satuday, take 7.5 mg on Sunday and Thursday 05/18/20  Yes Lars Masson, MD  Water For Irrigation, Sterile (FREE WATER) SOLN Place 180 mLs into feeding tube 3 (three) times daily.   Yes [provider]  White Petrolatum-Mineral Oil (SYSTANE NIGHTTIME) OINT Place 1 application into both eyes at bedtime.    Yes [provider]    Physical Exam: Vitals:   10/11/20 0056 10/11/20 0154 10/11/20 0156 10/11/20 0205  BP: (!) 162/75 (!) 145/50  (!) 141/50  Pulse: 74   64  Resp: 14 11 (!) 29 18  Temp:      TempSrc:      SpO2: 100%   100%  Weight:      Height:        Vitals:  10/11/20 0056 10/11/20 0154 10/11/20 0156 10/11/20 0205  BP: (!) 162/75 (!) 145/50  (!) 141/50  Pulse: 74   64  Resp: 14 11 (!) 29 18  Temp:      TempSrc:      SpO2: 100%   100%  Weight:      Height:       General: slender bur spry nanogenarian in no distress. Eyes: PERRL, lids nl, bulbar conjunctiva muddy, mild arcus senilis noted ENMT: Mucous membranes are moist. Posterior pharynx clear of any exudate or lesions.Edentulous  Neck: normal, supple, no masses, no thyromegaly Respiratory: clear to auscultation bilaterally, no wheezing, no crackles. Normal respiratory effort. No accessory muscle use.  Cardiovascular: IRIR no murmurs / rubs / gallops. No extremity edema. Trace pedal pulses. No carotid bruits.  Abdomen:  Bowel sounds positive. J-Tube in place with dark blood in the tubing. Tender to palpation in the area of the PEG tube. Mild diffuse tenderness. No guarding or rebound. Rectal per ED staff: heme positive dark stool.  Musculoskeletal: no clubbing / cyanosis. No joint deformity upper extremities. Right foot with dressing over toes where she has had surgery. Left foot with bulkier  dressing over toes. Dressings not removed. Good ROM, no contractures. Decreased muscle tone.  Skin: no rashes, lesions, ulcers. No induration Neurologic: CN 2-12 grossly intact. Sensation intact. Strength 5/5 in all 4.  Psychiatric: Normal judgment and insight. Alert and oriented x 3. Normal mood.     Labs on Admission: I have personally reviewed following labs and imaging studies  CBC: Recent Labs  Lab 10/10/20 2224  WBC 4.9  NEUTROABS 2.7  HGB 6.9*  HCT 21.2*  MCV 97.2  PLT 226   Basic Metabolic Panel: Recent Labs  Lab 10/10/20 2224  NA 135  K 4.7  CL 103  CO2 22  GLUCOSE 120*  BUN 77*  CREATININE 1.76*  CALCIUM 8.8*   GFR: Estimated Creatinine Clearance: 16.4 mL/min (A) (by C-G formula based on SCr of 1.76 mg/dL (H)). Liver Function Tests: No results for input(s): AST, ALT, ALKPHOS, BILITOT, PROT, ALBUMIN in the last 168 hours. No results for input(s): LIPASE, AMYLASE in the last 168 hours. No results for input(s): AMMONIA in the last 168 hours. Coagulation Profile: Recent Labs  Lab 10/08/20 1438 10/08/20 1446 10/08/20 1612 10/10/20 0356 10/10/20 2224  INR 7.5* 6.7* 6.7* 3.3* 2.9*   Cardiac Enzymes: No results for input(s): CKTOTAL, CKMB, CKMBINDEX, TROPONINI in the last 168 hours. BNP (last 3 results) No results for input(s): PROBNP in the last 8760 hours. HbA1C: No results for input(s): HGBA1C in the last 72 hours. CBG: No results for input(s): GLUCAP in the last 168 hours. Lipid Profile: No results for input(s): CHOL, HDL, LDLCALC, TRIG, CHOLHDL, LDLDIRECT in the last 72 hours. Thyroid Function Tests: No results for input(s): TSH, T4TOTAL, FREET4, T3FREE, THYROIDAB in the last 72 hours. Anemia Panel: No results for input(s): VITAMINB12, FOLATE, FERRITIN, TIBC, IRON, RETICCTPCT in the last 72 hours. Urine analysis:    Component Value Date/Time   COLORURINE YELLOW 04/29/2020 2200   APPEARANCEUR HAZY (A) 04/29/2020 2200   LABSPEC 1.012 04/29/2020  2200   PHURINE 6.0 04/29/2020 2200   GLUCOSEU NEGATIVE 04/29/2020 2200   HGBUR NEGATIVE 04/29/2020 2200   BILIRUBINUR NEGATIVE 04/29/2020 2200   BILIRUBINUR Neg 11/01/2017 1228   KETONESUR NEGATIVE 04/29/2020 2200   PROTEINUR NEGATIVE 04/29/2020 2200   UROBILINOGEN 0.2 11/01/2017 1228   UROBILINOGEN 0.2 01/27/2015 2308   NITRITE NEGATIVE 04/29/2020 2200  LEUKOCYTESUR TRACE (A) 04/29/2020 2200    Radiological Exams on Admission: No results found.  EKG: Independently reviewed. NO EKG done in ED  Assessment/Plan Active Problems:   GI bleed   Essential hypertension, benign   GERD (gastroesophageal reflux disease)   Type 2 diabetes mellitus with chronic kidney disease (HCC)   Persistent atrial fibrillation   Protein-calorie malnutrition, severe   Chronic diastolic CHF (congestive heart failure) (HCC)   PAD (peripheral artery disease) (HCC)   Hypothyroidism   CKD (chronic kidney disease) stage 3, GFR 30-59 ml/min (HCC)   UGI bleed  GI bleed - patient with drop in Hgb from 10.5 in Feb to 9.5 in May, to 8.8 July 1 to 6.9 July 10. Has had bleeding around PEG site but also blood in J-tube and dark heme positive stool. Suspect UGI origin of blood loss. Was on PPI at home  Plan Med-surg admit  Transfuse 2 uPRBCs to bring Hgb to  8+ g.  Q8 H/H starting several hours after transfusion  IV protonix w/o bolus  GI consult - secure chat sent to Dr. Adela Lank  2. A Fib - persistent with stable rate. Was on Warfarin which has been held for several days. Last INR 2.9. Vit K administered by ED staff.  Plan Continue cardiac meds  Hold warfarin  3. DM - last A1C 6.2% 09/23/20.   Plan Continue basal insulin  Sliding scale coverage  4. Protein-calorie malnutrition - will continue J-tube feedings  5. Chronic HFpEF - no signs of decompensation.  Plan Continue home meds  Additional lasix dose between units of blood  6. PAD - being evaluated for non-healing foot ulcer. She has had toe  amputation 2/2 PAD and is followed by vascular surgery. She was scheduled for abdominal aortogram. Appears stable at this time and will defer workup until GI issues stable  7. Hypothyroidisim - continue home meds  8. HTN- stable BP. Continue home meds  9. CKD - progressive CKD with rising Cr - may be acute 2/2 volume depletion  Plan Per #1  Follow Bmet    10. Code status - DNR   DVT prophylaxis: support hose in light of PAD and active bleed.  Code Status: DNR  Family Communication: Spoke with daughter Maryjean Ka. She understands Dx and Tx plan. She did report previous reaction to a blood transfusion in [place]. Informed her that blood bank carefully screens when do type and cross match. Disposition Plan: TBD  Consults called: GI - Dr. Adela Lank - sent him a secure chat requesting consult  Admission status: inpatient    Illene Regulus MD [facility] Pager [phone]  If 7PM-7AM, please contact night-coverage [email or web address] Password [facility]  10/11/2020, 2:44 AM

## 2020-10-11 NOTE — ED Notes (Signed)
Provider at bedside

## 2020-10-11 NOTE — ED Notes (Signed)
Admit provider at bedside 

## 2020-10-11 NOTE — Progress Notes (Signed)
Agree with plan as per Dr. 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  Evaluated [place] [facility] 10/09/2018 to 10/09/2020 as per HPI with PEG site bleeding which was cauterized-apparently had J-tube exchange 10/05/2020 in addition by IR  -subsequently found to have bloody bowel movements and came back to the emergency room   hemoglobin dropped from 9 range to 6 range Rx vitamin K Rx 2 units PRBC in ED---awaiting GI consult  BP (!) 179/63   Pulse 73   Temp 97.6 F (36.4 C) (Oral)   Resp 18   Ht 5\' 6"  (1.676 m)   Wt 54.4 kg   SpO2 100%   BMI 19.37 kg/m  Patient looks stable not having any emesis--patient is actually hypertensive at the bedside No dark stool no tarry stool Surprisingly conversant oriented pleasant tells me has 1 can of food in the evening and drinks liquids all day does not take oral solids--kept her n.p.o.  On exam large median scar with right-sided abdominal para incisional hernia Cachectic Pleasant poor dentition Chest clear no added sound no rales rhonchi No lower extremity edema  Await GI input--repeat labs as per protocol post transfusion If any bleeding nursing to alert physician-- -PPI bolus and gtt. Going -increase saline to 50 cc/at bedtime is n.p.o. - Follow every 8 hemoglobins, follow INR -All meds on hold other than absolutely essentials and we will resume once GI sees and gives an opinion -INR has been reversed and we would probably need to talk with family in the next 1 to 2 days about complete cessation of the same-she is 87 years old and has bled score outweighs CHA2DS2-VASc score in terms of risk-benefit ratio  No charge  Verneita Griffes, MD [facility] 8:12 AM

## 2020-10-11 NOTE — ED Notes (Signed)
Attempted report x1. 

## 2020-10-11 NOTE — Anesthesia Preprocedure Evaluation (Addendum)
Anesthesia Evaluation  Patient identified by MRN, date of birth, ID band Patient awake    Reviewed: Allergy & Precautions, NPO status , Patient's Chart, lab work & pertinent test results  History of Anesthesia Complications Negative for: history of anesthetic complications  Airway Mallampati: I  TM Distance: >3 FB Neck ROM: Full  Mouth opening: Limited Mouth Opening  Dental  (+) Edentulous Upper, Edentulous Lower, Dental Advisory Given   Pulmonary shortness of breath and with exertion, asthma , COPD,  COVID + May 2021   Pulmonary exam normal        Cardiovascular hypertension, Pt. on medications + CAD (nonobstructive), + Peripheral Vascular Disease, +CHF (grade 1 diastolic dysfunction) and + DVT (on coumadin)  Normal cardiovascular exam+ dysrhythmias Atrial Fibrillation   Echo 08/2019: 1. Left ventricular ejection fraction, by estimation, is 65 to 70%. The  left ventricle has normal function. The left ventricle has no regional  wall motion abnormalities. There is mild concentric left ventricular  hypertrophy. Left ventricular diastolic  parameters are consistent with Grade I diastolic dysfunction (impaired  relaxation).  2. Right ventricular systolic function is normal. The right ventricular  size is normal. There is normal pulmonary artery systolic pressure. The  estimated right ventricular systolic pressure is 25.4 mmHg.  3. The mitral valve is degenerative. Trivial mitral valve regurgitation.  4. The aortic valve has an indeterminant number of cusps. Aortic valve  regurgitation is trivial. Mild aortic valve sclerosis is present, with no  evidence of aortic valve stenosis.  5. The inferior vena cava is normal in size with <50% respiratory  variability, suggesting right atrial pressure of 8 mmHg.    Neuro/Psych  Headaches, PSYCHIATRIC DISORDERS Depression  Neuromuscular disease CVA, No Residual Symptoms     GI/Hepatic Neg liver ROS, hiatal hernia, PUD, GERD  Medicated and Controlled,  Endo/Other  diabetes, Well Controlled, Type 2, Insulin DependentHypothyroidism Last a1c 6.3  Renal/GU CRFRenal diseaseCKD 3, Cr 1.4  negative genitourinary   Musculoskeletal  (+) Arthritis , Osteoarthritis,    Abdominal   Peds negative pediatric ROS (+)  Hematology  (+) Blood dyscrasia, anemia ,   Anesthesia Other Findings   Reproductive/Obstetrics negative OB ROS                            Anesthesia Physical  Anesthesia Plan  ASA: 3  Anesthesia Plan: MAC   Post-op Pain Management:    Induction:   PONV Risk Score and Plan: 2 and Propofol infusion and Ondansetron  Airway Management Planned: Natural Airway  Additional Equipment: None  Intra-op Plan:   Post-operative Plan:   Informed Consent: I have reviewed the patients History and Physical, chart, labs and discussed the procedure including the risks, benefits and alternatives for the proposed anesthesia with the patient or authorized representative who has indicated his/her understanding and acceptance.     Dental advisory given  Plan Discussed with: CRNA and Anesthesiologist  Anesthesia Plan Comments:        Anesthesia Quick Evaluation

## 2020-10-11 NOTE — Progress Notes (Signed)
Attempted to get report x 1. 

## 2020-10-11 NOTE — ED Notes (Signed)
Noted bright red blood to dressing at g-tube site with dark red content in g-tube. Dressing  Changed around g-tube, noted a blood clot around g-tube stoma. Provider at bedside during this. New dressing applied with 4x4 and tape at this time

## 2020-10-11 NOTE — ED Provider Notes (Signed)
[facility] EMERGENCY DEPARTMENT Provider Note   CSN: [id number] Arrival date & time: 10/10/20  2216     History Chief Complaint  Patient presents with   G Tube Bleeding    Shannon [name] is a 87 y.o. female.  87 y/o female with hx of CAD, dCHF, PAF (on chronic Coumadin), PAD, HTN, HLD, COPD, DM, SIADH, CKD, GI issues s/p multiple laparotomies, severe dysphagia with chronic PEG tube presents to the ED for bleeding. Has been having dark red blood per rectum since this afternoon. Has the urge to have a BM, but has only been noted to pass blood. Also has had recurrence of bleeding from around her G-tube stoma since 1600 today. Daughter attempted to change dressing today to control the bleeding. Patient has continued to hold her Coumadin given recent hypercoagulable state. G-tube was last changed on Wednesday. Has had some abdominal soreness/discomfort since this procedure which isn't typical for the patient. Daughter reports fleeting episode of lightheadedness yesterday. No fevers, syncope, chest pain, new/worsening SOB.  The history is provided by the patient. No language interpreter was used.      Past Medical History:  Diagnosis Date   Acute respiratory failure ([place])    Anemia    previous blood transfusions   Arthritis    "all over"   Asthma    Atrial flutter (HCC)    Bradycardia    requiring previous d/c of BB and reduction of amiodarone   CAD (coronary artery disease)    nonobstructive per notes   Chronic diastolic CHF (congestive heart failure) ([place])    CKD (chronic kidney disease), stage III ([place])    Complication of blood transfusion    "got the wrong blood type at [facility] in ~ 2015; no adverse reaction that we are aware of"/daughter, [name] (01/27/2016)   COPD (chronic obstructive pulmonary disease) ([place])    Depression    "light case"   DVT (deep venous thrombosis) ([place]) 01/2016   a. LLE DVT 01/2016 - switched from Eliquis to Coumadin.   Dyspnea     with some activity   Gastric stenosis    a. s/p stomach tube   GERD (gastroesophageal reflux disease)    Headache    History of 2019 novel coronavirus disease (COVID-19)    History of blood transfusion    "several" (01/27/2016)   History of stomach ulcers    Hyperlipidemia    Hypertension    Hypothyroidism    On home oxygen therapy    "2L; 9PM til 9AM" (06/27/2017)   PAD (peripheral artery disease) (HCC)    a. prior gangrene, toe amputations, intervention.   PAF (paroxysmal atrial fibrillation) ([place])    Paraesophageal hernia    Perforated gastric ulcer ([place])    Pneumonia    "a few times" (06/27/2017)   Seasonal allergies    SIADH (syndrome of inappropriate ADH production) ([place])    [name] 01/10/2015   Small bowel obstruction ([place])    "I don't know how many" (01/11/2015)   Stroke ([place])    several- no residual   Type II diabetes mellitus ([place])    "related to prednisone use  for > 20 yr; once predinose stopped; no more DM RX" (01/27/2016)   UTI (urinary tract infection) 02/08/2016   Ventral hernia with bowel obstruction     Patient Active Problem List   Diagnosis Date Noted   UGI bleed 10/11/2020   Depression, major, single episode, complete remission ([place]) 06/09/2020   History of transmetatarsal  amputation of right foot ([place]) 11/14/2019   Chest pain 08/27/2019   Lab test positive for detection of COVID-19 virus 08/27/2019   Acute encephalopathy 07/07/2019   AMS (altered mental status) 07/07/2019   Varicose veins of right lower extremity with ulcer other part of foot ([place]) 06/11/2019   Jejunostomy tube fell out 03/02/2019   S/P amputation of lesser toe, left (HCC) 02/13/2019   BRBPR (bright red blood per rectum) 01/10/2019   GI bleed 12/25/2018   Bradycardia 11/25/2018   Amputated great toe of right foot ([place]) 05/06/2018   Peripheral vascular disease ([place]) 05/03/2018   Localized, primary osteoarthritis of shoulder region 01/21/2018   PAD (peripheral artery disease) ([place])  06/27/2017   AKI (acute kidney injury) ([place]) 19/41/7408   Complication of feeding tube ([place]) 02/20/2017   PEG tube malfunction (HCC)    COPD with asthma ([place]) 10/03/2016   Chronic deep vein thrombosis (DVT) of tibial vein of left lower extremity ([place]) 10/03/2016   Gangrene of toe of left foot ([place]) 09/15/2016   Recurrent aspiration pneumonia (HCC)    Atherosclerosis of native arteries of the extremities with gangrene ([place]) 07/28/2016   Other specified hypothyroidism    CHF (NYHA class IV, ACC/AHA stage D) ([place])    Shortness of breath    Pressure injury of skin 03/11/2016   PEG (percutaneous endoscopic gastrostomy) status ([place]) 03/10/2016   Encounter for therapeutic drug monitoring 02/16/2016   Warfarin anticoagulation    Chronic seasonal allergic rhinitis    Atrial fibrillation and flutter ([place]) 01/24/2016   Chronic diastolic CHF (congestive heart failure) ([place]) 01/24/2016   Bilateral leg edema 12/29/2015   Hypothyroidism due to medication    Weakness    Pyloric stenosis    Pressure ulcer 09/15/2015   PVD (peripheral vascular disease) ([place])    Diabetic ulcer of toe of right foot associated with type 2 diabetes mellitus, with necrosis of muscle ([place])    Diverticulitis of large intestine without perforation or abscess without bleeding    Colitis 08/26/2015   Esophageal candidiasis (HCC)    SOB (shortness of breath)    Acute renal failure superimposed on stage 4 chronic kidney disease ([place])    Encounter for long-term (current) use of other high-risk medications    Dysphagia 07/05/2015   Asthma with allergic rhinitis 06/04/2015   Hypertensive heart disease 03/18/2015   Malnutrition of moderate degree 01/28/2015   Syncope 01/27/2015   Symptomatic bradycardia 01/21/2015   Hypotension due to drugs 01/21/2015   Protein-calorie malnutrition, severe 01/12/2015   Hypertension associated with diabetes ([place]) 01/12/2015   Cerebrovascular disease 07/14/2014   Diabetic polyneuropathy  associated with type 2 diabetes mellitus ([place]) 07/14/2014   Hyperlipidemia 07/14/2014   Gastric outlet obstruction 05/21/2014   Unstable gait 04/29/2014   Slurred speech    Gastroparesis    Constipation 02/03/2014   Carotid stenosis 02/03/2014   Demand ischemia ([place]) 01/21/2014   Gastric bezoar 01/20/2014   Anemia of chronic disease 01/18/2014   CKD (chronic kidney disease) stage 3, GFR 30-59 ml/min ([place]) 01/18/2014   Depression 09/29/2013   Essential hypertension, benign 09/22/2013   GERD (gastroesophageal reflux disease) 09/22/2013   Hyperlipidemia associated with type 2 diabetes mellitus ([place]) 09/22/2013   Type 2 diabetes mellitus with chronic kidney disease ([place]) 09/22/2013   Persistent atrial fibrillation 09/22/2013   History of completed stroke 09/22/2013   Diabetic neuropathy ([place]) 09/22/2013   Hypothyroidism 09/22/2013   Chronic gastrojejunal ulcer with perforation ([place]) 09/07/2013   Accelerated hypertension 09/07/2013  Other specified abnormal immunological findings in serum 09/07/2013   Red blood cell antibody positive 09/07/2013    Past Surgical History:  Procedure Laterality Date   ABDOMINAL AORTOGRAM N/A 09/21/2016   Procedure: Abdominal Aortogram;  Surgeon: Waynetta Sandy, MD;  Location: [facility];  Service: Cardiovascular;  Laterality: N/A;   AMPUTATION Left 09/25/2016   Procedure: AMPUTATION DIGIT- LEFT 2ND AND 3RD TOES;  Surgeon: Rosetta Posner, MD;  Location: [facility];  Service: Vascular;  Laterality: Left;   AMPUTATION Right 05/03/2018   Procedure: AMPUTATION RIGHT GREAT TOE;  Surgeon: Rosetta Posner, MD;  Location: [facility];  Service: Vascular;  Laterality: Right;   CATARACT EXTRACTION W/ INTRAOCULAR LENS  IMPLANT, BILATERAL     CHOLECYSTECTOMY OPEN     COLECTOMY     ESOPHAGOGASTRODUODENOSCOPY N/A 01/19/2014   Procedure: ESOPHAGOGASTRODUODENOSCOPY (EGD);  Surgeon: Irene Shipper, MD;  Location: [facility];  Service: Endoscopy;  Laterality: N/A;    ESOPHAGOGASTRODUODENOSCOPY N/A 01/20/2014   Procedure: ESOPHAGOGASTRODUODENOSCOPY (EGD);  Surgeon: Irene Shipper, MD;  Location: [facility];  Service: Endoscopy;  Laterality: N/A;   ESOPHAGOGASTRODUODENOSCOPY N/A 03/19/2014   Procedure: ESOPHAGOGASTRODUODENOSCOPY (EGD);  Surgeon: Milus Banister, MD;  Location: [facility];  Service: Endoscopy;  Laterality: N/A;   ESOPHAGOGASTRODUODENOSCOPY N/A 07/08/2015   Procedure: ESOPHAGOGASTRODUODENOSCOPY (EGD);  Surgeon: Doran Stabler, MD;  Location: [facility]OSCOPY;  Service: Endoscopy;  Laterality: N/A;   ESOPHAGOGASTRODUODENOSCOPY (EGD) WITH PROPOFOL N/A 09/15/2015   Procedure: ESOPHAGOGASTRODUODENOSCOPY (EGD) WITH PROPOFOL;  Surgeon: Manus Gunning, MD;  Location: [facility];  Service: Gastroenterology;  Laterality: N/A;   GASTROJEJUNOSTOMY     hx/notes 01/10/2015   GASTROJEJUNOSTOMY N/A 09/23/2015   Procedure: OPEN GASTROJEJUNOSTOMY TUBE PLACEMENT;  Surgeon: Arta Bruce Kinsinger, MD;  Location: [facility] ORS;  Service: General;  Laterality: N/A;   GLAUCOMA SURGERY Bilateral    HERNIA REPAIR  2015   IR CM INJ ANY COLONIC TUBE W/FLUORO  01/05/2017   IR CM INJ ANY COLONIC TUBE W/FLUORO  06/07/2017   IR CM INJ ANY COLONIC TUBE W/FLUORO  11/05/2017   IR CM INJ ANY COLONIC TUBE W/FLUORO  09/18/2018   IR CM INJ ANY COLONIC TUBE W/FLUORO  03/06/2019   IR CM INJ ANY COLONIC TUBE W/FLUORO  03/11/2019   IR GASTR TUBE CONVERT GASTR-JEJ PER W/FL MOD SED  04/30/2020   IR GENERIC HISTORICAL  01/07/2016   IR GJ TUBE CHANGE 01/07/2016 Jacqulynn Cadet, MD [facility]   IR GENERIC HISTORICAL  01/27/2016   IR MECH REMOV OBSTRUC MAT ANY COLON TUBE W/FLUORO 01/27/2016 Markus Daft, MD [facility]   IR GENERIC HISTORICAL  02/07/2016   IR PATIENT EVAL TECH 0-60 MINS Darrell K Allred, PA-C [facility]   IR GENERIC HISTORICAL  02/08/2016   IR GJ TUBE CHANGE 02/08/2016 Greggory Keen, MD [facility]   IR GENERIC HISTORICAL  01/06/2016   IR GJ TUBE CHANGE 01/06/2016 [facility] OUT  REF   IR GENERIC HISTORICAL  05/02/2016   IR CM INJ ANY COLONIC TUBE W/FLUORO 05/02/2016 Arne Cleveland, MD [facility]   IR GENERIC HISTORICAL  05/15/2016   IR GJ TUBE CHANGE 05/15/2016 Sandi Mariscal, MD [facility]   IR GENERIC HISTORICAL  06/28/2016   IR [place] TUBE CHANGE 06/28/2016 [facility]   IR [place] TUBE CHANGE  02/20/2017   IR GJ TUBE CHANGE  05/10/2017   IR GJ TUBE CHANGE  07/06/2017   IR [place] TUBE CHANGE  08/02/2017   IR [place] TUBE CHANGE  10/15/2017  IR GJ TUBE CHANGE  12/26/2017   IR GJ TUBE CHANGE  04/08/2018   IR GJ TUBE CHANGE  06/19/2018   IR GJ TUBE CHANGE  03/03/2019   IR GJ TUBE CHANGE  06/06/2019   IR GJ TUBE CHANGE  09/26/2019   IR GJ TUBE CHANGE  01/09/2020   IR GJ TUBE CHANGE  05/12/2020   IR GJ TUBE CHANGE  10/05/2020   IR PATIENT EVAL TECH 0-60 MINS  10/19/2016   IR PATIENT EVAL TECH 0-60 MINS  12/25/2016   IR PATIENT EVAL TECH 0-60 MINS  01/29/2017   IR PATIENT EVAL TECH 0-60 MINS  04/04/2017   IR PATIENT EVAL TECH 0-60 MINS  04/30/2017   IR PATIENT EVAL TECH 0-60 MINS  07/31/2017   IR REPLC DUODEN/JEJUNO TUBE PERCUT W/FLUORO  11/14/2016   LAPAROTOMY N/A 01/20/2015   Procedure: EXPLORATORY LAPAROTOMY;  Surgeon: Coralie Keens, MD;  Location: [facility];  Service: General;  Laterality: N/A;   LOWER EXTREMITY ANGIOGRAPHY Left 09/21/2016   Procedure: Lower Extremity Angiography;  Surgeon: Waynetta Sandy, MD;  Location: [facility];  Service: Cardiovascular;  Laterality: Left;   LOWER EXTREMITY ANGIOGRAPHY Right 06/27/2017   Procedure: Lower Extremity Angiography;  Surgeon: Waynetta Sandy, MD;  Location: [facility];  Service: Cardiovascular;  Laterality: Right;   LYSIS OF ADHESION N/A 01/20/2015   Procedure: LYSIS OF ADHESIONS < 1 HOUR;  Surgeon: Coralie Keens, MD;  Location: [facility];  Service: General;  Laterality: N/A;   PERIPHERAL VASCULAR BALLOON ANGIOPLASTY Left 09/21/2016   Procedure: Peripheral Vascular Balloon Angioplasty;  Surgeon: Waynetta Sandy, MD;  Location: [facility];  Service: Cardiovascular;  Laterality: Left;  drug coated balloon   PERIPHERAL VASCULAR BALLOON ANGIOPLASTY Right 06/27/2017   Procedure: PERIPHERAL VASCULAR BALLOON ANGIOPLASTY;  Surgeon: Waynetta Sandy, MD;  Location: [facility];  Service: Cardiovascular;  Laterality: Right;  [id number]   TONSILLECTOMY     TRANSMETATARSAL AMPUTATION Right 11/14/2019   Procedure: TRANSMETATARSAL AMPUTATION RIGHT FOOT;  Surgeon: Edrick Kins, DPM;  Location: [facility];  Service: Podiatry;  Laterality: Right;   TUBAL LIGATION     VENTRAL HERNIA REPAIR  2015   incarcerated ventral hernia ([facility] 09/2013)/notes 01/10/2015     OB History   No obstetric history on file.     Family History  Problem Relation Age of Onset   Stroke Mother    Hypertension Mother    Diabetes Brother    Glaucoma Son    Glaucoma Son    Heart attack Neg Hx    Colon cancer Neg Hx    Stomach cancer Neg Hx     Social History   Tobacco Use   Smoking status: Never   Smokeless tobacco: Former    Types: Snuff    Quit date: 04/03/1980   Tobacco comments:    "used snuff in my younger days"  Vaping Use   Vaping Use: Never used  Substance Use Topics   Alcohol use: Never    Alcohol/week: 0.0 standard drinks   Drug use: Never    Home Medications Prior to Admission medications   Medication Sig Start Date End Date Taking? Authorizing Provider  acetaminophen (TYLENOL) 500 MG tablet Take 500 mg by mouth every 6 (six) hours as needed for moderate pain.   Yes [provider]  amLODipine (NORVASC) 5 MG tablet Take 1 tablet (5 mg total) by mouth daily. 10/20/19 10/19/20 Yes Medina-Vargas, Monina C, NP  atorvastatin (  LIPITOR) 10 MG tablet TAKE 1 TABLET BY MOUTH EVERY DAY Patient taking differently: Take 10 mg by mouth daily. 06/24/20  Yes [name], NP  b complex vitamins tablet Take 1 tablet by mouth daily.    Yes [provider]  benzocaine  (AMERICAINE) 20 % rectal ointment Place rectally every 3 (three) hours as needed for pain. 09/24/19  Yes Fawze, Mina A, PA-C  cholecalciferol (VITAMIN D3) 25 MCG (1000 UNIT) tablet Take 1,000 Units by mouth daily.   Yes [provider]  cycloSPORINE (RESTASIS) 0.05 % ophthalmic emulsion USE 1 DROP INTO BOTH EYES TWICE DAILY Patient taking differently: Place 1 drop into both eyes 2 (two) times daily. 01/29/17  Yes Eulas Post, Monica, DO  DULoxetine (CYMBALTA) 30 MG capsule TAKE 1 CAPSULE BY MOUTH EVERY DAY Patient taking differently: Take 30 mg by mouth daily. 05/05/20  Yes [name], NP  ferrous sulfate 220 (44 Fe) MG/5ML solution TAKE 7.4 MLS (325 MG TOTAL) BY MOUTH DAILY. 03/04/20  Yes [name], NP  fluticasone (FLONASE) 50 MCG/ACT nasal spray SPRAY 2 SPRAYS INTO EACH NOSTRIL EVERY DAY Patient taking differently: Place 2 sprays into both nostrils in the morning and at bedtime. 06/25/19  Yes [name], NP  furosemide (LASIX) 40 MG tablet TAKE 0.5-1 TABLETS (20-40 MG TOTAL) BY MOUTH SEE ADMIN INSTRUCTIONS. TAKE 20 MG DAILY, MAY INSTEAD TAKE 40 MG DAILY AS NEEDED FOR EDEMA 08/04/20  Yes Freada Bergeron, MD  gabapentin (NEURONTIN) 300 MG capsule Take 1 capsule (300 mg total) by mouth 2 (two) times daily. 05/17/20  Yes [name], NP  gentamicin cream (GARAMYCIN) 0.1 % Apply 1 application topically daily as needed (dry skin). 10/01/19  Yes Edrick Kins, DPM  hydrALAZINE (APRESOLINE) 50 MG tablet TAKE 1 TABLET BY MOUTH THREE TIMES A DAY Patient taking differently: Take 50 mg by mouth 3 (three) times daily. 04/26/20  Yes Dorothy Spark, MD  insulin aspart (NOVOLOG) 100 UNIT/ML FlexPen Inject 2-6 Units into the skin as needed for high blood sugar. New sliding Scale If BS is 151 - 251 = 2 units,  If BS is 251 -  350 = 4 units,  If BS is 351 - 450 = 6 units Call provider if BS runs more than 400   Yes [provider]  Insulin Pen Needle (B-D UF III MINI PEN  NEEDLES) 31G X 5 MM MISC Use twice daily with giving insulin injections. Dx E11.9 07/17/18  Yes [name], NP  ipratropium-albuterol (DUONEB) 0.5-2.5 (3) MG/3ML SOLN Take 3 mLs by nebulization every 6 (six) hours as needed (shortness of breath).   Yes [provider]  isosorbide mononitrate (IMDUR) 30 MG 24 hr tablet TAKE 2 TABLETS BY MOUTH DAILY. Patient taking differently: Take 60 mg by mouth in the morning. 09/20/20  Yes Pemberton, Greer Ee, MD  LANTUS SOLOSTAR 100 UNIT/ML Solostar Pen INJECT 8 UNITS INTO THE SKIN AT BEDTIME. Patient taking differently: Inject 8 Units into the skin at bedtime as needed (blood sugar 150 or above). 05/10/20  Yes [name], NP  latanoprost (XALATAN) 0.005 % ophthalmic solution Place 1 drop into both eyes at bedtime.   Yes [provider]  levalbuterol (XOPENEX HFA) 45 MCG/ACT inhaler Inhale 2 puffs into the lungs every 6 (six) hours as needed for wheezing.   Yes [provider]  levothyroxine (SYNTHROID) 125 MCG tablet TAKE 1 TABLET BY MOUTH EVERY DAY Patient taking differently: Take 125 mcg by  mouth daily before breakfast. 06/24/20  Yes Eubanks, [name], NP  Lidocaine-Glycerin (PREPARATION H EX) Apply 1 application topically 2 (two) times daily as needed (hemorrhoids).   Yes [provider]  loratadine (CLARITIN) 10 MG tablet Take 10 mg by mouth daily.   Yes [provider]  mometasone-formoterol (DULERA) 200-5 MCG/ACT AERO INHALE TWO PUFFS BY MOUTH TWICE DAILY Patient taking differently: Inhale 2 puffs into the lungs in the morning and at bedtime. 10/05/20  Yes [name], NP  Multiple Vitamin (MULTIVITAMIN WITH MINERALS) TABS tablet Take 1 tablet by mouth daily.   Yes [provider]  Nutritional Supplements (FEEDING SUPPLEMENT, OSMOLITE 1.5 CAL,) LIQD Place 711 mLs into feeding tube See admin instructions. Take 711 ml over a 12 hour period per day 9 am-9 pm at a rate of 65   Yes [provider]  nystatin (MYCOSTATIN) 100000 UNIT/ML suspension Take 5 mLs (500,000 Units total) by mouth 4 (four) times daily. Patient taking differently: Take 5 mLs by mouth 4 (four) times daily as needed (thrush). 09/06/20  Yes [name], NP  nystatin (MYCOSTATIN/NYSTOP) powder Apply 1 application topically as needed (skin irritation). 09/11/19  Yes Ngetich, Dinah C, NP  nystatin cream (MYCOSTATIN) APPLY TO AFFECTED AREA TWICE A DAY Patient taking differently: Apply 1 application topically at bedtime. 05/10/20  Yes [name], NP  oxyCODONE (ROXICODONE) 5 MG immediate release tablet Take 0.5-1 tablets (2.5-5 mg total) by mouth every 8 (eight) hours as needed for severe pain. 10/05/20  Yes [name], NP  pantoprazole (PROTONIX) 40 MG tablet Take 1 tablet (40 mg total) by mouth daily. Patient taking differently: Take 40 mg by mouth daily with supper. 09/23/20  Yes Freada Bergeron, MD  Polyethyl Glycol-Propyl Glycol (SYSTANE) 0.4-0.3 % GEL ophthalmic gel Place 1 application into both eyes 2 (two) times daily as needed (moisture).   Yes [provider]  Probiotic Product (EQL PROBIOTIC COLON SUPPORT PO) Take 1 capsule by mouth daily.   Yes [provider]  senna-docusate (SENOKOT-S) 8.6-50 MG tablet Take 1 tablet by mouth as needed for mild constipation.   Yes [provider]  shark liver oil-cocoa butter (PREPARATION H) 0.25-3-85.5 % suppository Place 1 suppository rectally as needed for hemorrhoids.   Yes [provider]  warfarin (COUMADIN) 2.5 MG tablet TAKE 2-3 TABLETS BY MOUTH DAILY AS DIRECTED BY COUMADIN CLINIC. Patient taking differently: Take 5-7.5 mg by mouth See admin instructions. Take 5 mg by mouth on Monday, Tuesday, Wednesday, Friday and Satuday, take 7.5 mg on Sunday and Thursday 05/18/20  Yes Dorothy Spark, MD  Water For Irrigation, Sterile (FREE WATER) SOLN Place 180 mLs into feeding tube 3 (three) times daily.   Yes  [provider]  White Petrolatum-Mineral Oil (SYSTANE NIGHTTIME) OINT Place 1 application into both eyes at bedtime.    Yes [provider]    Allergies    Penicillins  Review of Systems   Review of Systems Ten systems reviewed and are negative for acute change, except as noted in the HPI.    Physical Exam Updated Vital Signs BP (!) 141/50 (BP Location: Right Arm)   Pulse 64   Temp 98 F (36.7 C) (Oral)   Resp 18   Ht 5\' 6"  (1.676 m)   Wt 54.4 kg   SpO2 100%   BMI 19.37 kg/m   Physical Exam Vitals and nursing note reviewed.  Constitutional:      General: She is not  in acute distress.    Appearance: She is well-developed. She is not diaphoretic.     Comments: Chronically frail appearing, but nontoxic. In NAD.  HENT:     Head: Normocephalic and atraumatic.  Eyes:     General: No scleral icterus.    Conjunctiva/sclera: Conjunctivae normal.  Cardiovascular:     Rate and Rhythm: Normal rate and regular rhythm.     Pulses: Normal pulses.  Pulmonary:     Effort: Pulmonary effort is normal. No respiratory distress.     Breath sounds: No wheezing.     Comments: Respirations even and unlabored Abdominal:     Palpations: Abdomen is soft.     Comments: Minimal ooze from around the G-tube site. Abdomen is soft, nondistended. Prior well healed surgical scars noted.  Genitourinary:    Comments: Chaperoned by RN. Dark red blood noted on DRE. Normal rectal tone. Not acutely hemorrhaging.  Musculoskeletal:        General: Normal range of motion.     Cervical back: Normal range of motion.  Skin:    General: Skin is warm and dry.     Coloration: Skin is not pale.     Findings: No erythema or rash.  Neurological:     Mental Status: She is alert and oriented to person, place, and time.     Coordination: Coordination normal.  Psychiatric:        Behavior: Behavior normal.    ED Results / Procedures / Treatments   Labs (all labs ordered are listed, but only  abnormal results are displayed) Labs Reviewed  CBC WITH DIFFERENTIAL/PLATELET - Abnormal; Notable for the following components:      Result Value   RBC 2.18 (*)    Hemoglobin 6.9 (*)    HCT 21.2 (*)    RDW 17.4 (*)    All other components within normal limits  BASIC METABOLIC PANEL - Abnormal; Notable for the following components:   Glucose, Bld 120 (*)    BUN 77 (*)    Creatinine, Ser 1.76 (*)    Calcium 8.8 (*)    GFR, Estimated 26 (*)    All other components within normal limits  PROTIME-INR - Abnormal; Notable for the following components:   Prothrombin Time 30.0 (*)    INR 2.9 (*)    All other components within normal limits  POC OCCULT BLOOD, ED - Abnormal; Notable for the following components:   Fecal Occult Bld POSITIVE (*)    All other components within normal limits  RESP PANEL BY RT-PCR (FLU A&B, COVID) [id number]  PROTIME-INR  HEMOGLOBIN AND HEMATOCRIT, BLOOD  HEMOGLOBIN AND HEMATOCRIT, BLOOD  TYPE AND SCREEN  PREPARE RBC (CROSSMATCH)    EKG None  Radiology No results found.  Procedures .Critical Care  Date/Time: 10/11/2020 2:05 AM Performed by: Antonietta Breach, PA-C Authorized by: Antonietta Breach, PA-C   Critical care provider statement:    Critical care time (minutes):  45   Critical care was necessary to treat or prevent imminent or life-threatening deterioration of the following conditions:  Circulatory failure (GI bleed; anemia)   Critical care was time spent personally by me on the following activities:  Discussions with consultants, evaluation of patient's response to treatment, examination of patient, ordering and performing treatments and interventions, ordering and review of laboratory studies, ordering and review of radiographic studies, pulse oximetry, re-evaluation of patient's condition, obtaining history from patient or surrogate and review of old charts   Medications Ordered in ED Medications  phytonadione (  VITAMIN K) 5 mg in dextrose 5 % 50 mL IVPB  (5 mg Intravenous New Bag/Given 10/11/20 0204)  acetaminophen (TYLENOL) tablet 500 mg (has no administration in time range)  oxyCODONE (Oxy IR/ROXICODONE) immediate release tablet 2.5-5 mg (has no administration in time range)  amLODipine (NORVASC) tablet 5 mg (has no administration in time range)  furosemide (LASIX) tablet 20 mg (has no administration in time range)  hydrALAZINE (APRESOLINE) tablet 50 mg (has no administration in time range)  isosorbide mononitrate (IMDUR) 24 hr tablet 60 mg (has no administration in time range)  DULoxetine (CYMBALTA) DR capsule 30 mg (has no administration in time range)  insulin glargine (LANTUS) Solostar Pen 8 Units (has no administration in time range)  levothyroxine (SYNTHROID) tablet 125 mcg (has no administration in time range)  free water 180 mL (has no administration in time range)  gabapentin (NEURONTIN) capsule 300 mg (has no administration in time range)  feeding supplement (OSMOLITE 1.5 CAL) liquid 711 mL (has no administration in time range)  ipratropium-albuterol (DUONEB) 0.5-2.5 (3) MG/3ML nebulizer solution 3 mL (has no administration in time range)  levalbuterol (XOPENEX) nebulizer solution 0.63 mg (has no administration in time range)  mometasone-formoterol (DULERA) 200-5 MCG/ACT inhaler 2 puff (has no administration in time range)  cycloSPORINE (RESTASIS) 0.05 % ophthalmic emulsion 1 drop (has no administration in time range)  latanoprost (XALATAN) 0.005 % ophthalmic solution 1 drop (has no administration in time range)  benzocaine (AMERICAINE) 20 % rectal ointment (has no administration in time range)  nystatin (MYCOSTATIN) 100000 UNIT/ML suspension 500,000 Units (has no administration in time range)  nystatin (MYCOSTATIN/NYSTOP) topical powder 1 application (has no administration in time range)  polyvinyl alcohol (LIQUIFILM TEARS) 1.4 % ophthalmic solution 1 drop (has no administration in time range)  artificial tears (LACRILUBE)  ophthalmic ointment 1 application (has no administration in time range)  furosemide (LASIX) tablet 20 mg (has no administration in time range)  0.9 %  sodium chloride infusion (has no administration in time range)    ED Course  I have reviewed the triage vital signs and the nursing notes.  Pertinent labs & imaging results that were available during my care of the patient were reviewed by me and considered in my medical decision making (see chart for details).  Clinical Course as of 10/11/20 0245  Mon Oct 11, 2020  0145 Pharmacy recommends starting with 5mg  Vitamin K; this has been ordered. [KH]  0157 Case discussed with Dr. Linda Hedges of [facility] who will assess the patient for admission and notify GI service via secure chat on the need for AM consultation.  [KH]  0202 Per chart review, patient last seen in 2017 by [facility]. She reports she has not followed up with any other provider in the interim. [KH]    Clinical Course User Index [KH] Beverely Pace   MDM Rules/Calculators/A&P                          87 year old female presents to the emergency department for evaluation of bleeding from around her PEG tube as well as dark red blood per rectum.  She is chronically anticoagulated on Coumadin given her history of atrial fibrillation.  This has been held since her last INR check as she was supratherapeutic with an INR of over 7.  Today INR is 2.9.  Hemoglobin noted to have dropped 2 g in the past 10 days.  She has been hemodynamically stable and was started  on vitamin K.  Question whether blood per rectum is related to bleeding from around her PEG tube site.  Will require admission for further evaluation, CBC trending.  Hospitalist to contact GI to notify of need for inpatient consultation.   Final Clinical Impression(s) / ED Diagnoses Final diagnoses:  Gastrointestinal hemorrhage, unspecified gastrointestinal hemorrhage type  Acute blood loss anemia  Bleeding from gastrostomy tube site  [facility])  Hematochezia    Rx / DC Orders ED Discharge Orders     None        Antonietta Breach, PA-C 10/11/20 0303    Palumbo, April, MD 10/11/20 0408

## 2020-10-11 NOTE — Consult Note (Signed)
Consultation  Referring Provider:     [facility] hospitalists Primary Care Physician:  [name], NP Primary Gastroenterologist:        Scarlette Shorts Reason for Consultation:     GI bleed     Impression / Plan:   Upper GI bleeding in the setting of over anticoagulation, in an elderly woman with a chronic gastrojejunostomy tube due to pyloric stenosis.  gastrojejunostomy ostomy site bleeding treated with silver nitrate on admission.  Warfarin therapy for history of DVT A flutter CHF  Acute blood loss anemia  Peripheral vascular disease with left great toe/foot ulceration, prior to admission was scheduled for left lower extremity angiogram with possible intervention via right common femoral artery tomorrow  Undoubtably the over anticoagulation that was found last Friday 3 days ago is a big part of her problem.  She has several ongoing medical issues and she is being actively treated and has a decent quality of life so I think pursuing endoscopic evaluation makes sense.  I suspect at least some if not all of this was stomal bleeding but additional evaluation makes sense.  I have spoken to the patient and her daughter and reviewed the risks benefits and indications and I anticipate doing that tomorrow.  I agree with the current supportive care with PPI, vitamin K and withholding warfarin.  Sort out timing of warfarin resumption and indications etc. after EGD.  I am letting her have clear liquids today as I do not think there would be likely we would intervene today.     I appreciate the opportunity to care for this patient. Gatha Mayer, MD, [facility]   HPI:   Shannon [name] is a 87 y.o. female with a history of peripheral vascular disease, prior DVT a flutter on warfarin, CHF and chronic pyloric stenosis treated with a gastrojejunostomy tube who presents with bleeding around the gastro jejunostomy tube site, with a recent finding of INR 7.5 on July 8.  She presented with weakness and  melena and blood in and around the J-tube overnight and was admitted.  She has been weak.  She complains of some rectal discomfort for some time now.  No bowel habit changes.  Nursing staff reports that the gastrostomy bandages were soaked with blood and were changed, apparently she had some bleeding around the stoma that was treated with silver nitrate in the past week.  The [place] tube was changed on July 5.  The balloon had ruptured. When her INR was discovered to be high she was instructed to hold her warfarin which she did.  INR was 2.9 last night when she came in.  She is feeling better now after having some blood transfusion.  She does tolerate clear liquids she is not having any abdominal pain.  Again she has been weak and somewhat dyspneic.  She ambulates with a walker at home.  She has been evaluated by vascular surgery Dr. Stanford Breed on July 5 because of a left great toe ulcer and there were plans to do angiography and possible intervention tomorrow.  Last EGD was in 2017 by Dr. Havery Moros, she had NG tube trauma/esophagitis and a pyloric stenosis dilated to 15 mm.  She had an open gastrojejunostomy after that. Past Medical History:  Diagnosis Date   Acute respiratory failure ([place])    Anemia    previous blood transfusions   Arthritis    "all over"   Asthma    Atrial flutter (HCC)    Bradycardia  requiring previous d/c of BB and reduction of amiodarone   CAD (coronary artery disease)    nonobstructive per notes   Chronic diastolic CHF (congestive heart failure) ([place])    CKD (chronic kidney disease), stage III ([place])    Complication of blood transfusion    "got the wrong blood type at [facility] in ~ 2015; no adverse reaction that we are aware of"/daughter, [name] (01/27/2016)   COPD (chronic obstructive pulmonary disease) ([place])    Depression    "light case"   DVT (deep venous thrombosis) ([place]) 01/2016   a. LLE DVT 01/2016 - switched from Eliquis to Coumadin.   Dyspnea    with some  activity   Gastric stenosis    a. s/p stomach tube   GERD (gastroesophageal reflux disease)    Headache    History of 2019 novel coronavirus disease (COVID-19)    History of blood transfusion    "several" (01/27/2016)   History of stomach ulcers    Hyperlipidemia    Hypertension    Hypothyroidism    On home oxygen therapy    "2L; 9PM til 9AM" (06/27/2017)   PAD (peripheral artery disease) (HCC)    a. prior gangrene, toe amputations, intervention.   PAF (paroxysmal atrial fibrillation) ([place])    Paraesophageal hernia    Perforated gastric ulcer ([place])    Pneumonia    "a few times" (06/27/2017)   Seasonal allergies    SIADH (syndrome of inappropriate ADH production) ([place])    [name] 01/10/2015   Small bowel obstruction ([place])    "I don't know how many" (01/11/2015)   Stroke ([place])    several- no residual   Type II diabetes mellitus ([place])    "related to prednisone use  for > 20 yr; once predinose stopped; no more DM RX" (01/27/2016)   UTI (urinary tract infection) 02/08/2016   Ventral hernia with bowel obstruction     Past Surgical History:  Procedure Laterality Date   ABDOMINAL AORTOGRAM N/A 09/21/2016   Procedure: Abdominal Aortogram;  Surgeon: Waynetta Sandy, MD;  Location: [facility];  Service: Cardiovascular;  Laterality: N/A;   AMPUTATION Left 09/25/2016   Procedure: AMPUTATION DIGIT- LEFT 2ND AND 3RD TOES;  Surgeon: Rosetta Posner, MD;  Location[facility];  Service: Vascular;  Laterality: Left;   AMPUTATION Right 05/03/2018   Procedure: AMPUTATION RIGHT GREAT TOE;  Surgeon: Rosetta Posner, MD;  Location: [facility];  Service: Vascular;  Laterality: Right;   CATARACT EXTRACTION W/ INTRAOCULAR LENS  IMPLANT, BILATERAL     CHOLECYSTECTOMY OPEN     COLECTOMY     ESOPHAGOGASTRODUODENOSCOPY N/A 01/19/2014   Procedure: ESOPHAGOGASTRODUODENOSCOPY (EGD);  Surgeon: Irene Shipper, MD;  Location: [facility];  Service: Endoscopy;  Laterality: N/A;   ESOPHAGOGASTRODUODENOSCOPY N/A  01/20/2014   Procedure: ESOPHAGOGASTRODUODENOSCOPY (EGD);  Surgeon: Irene Shipper, MD;  Location: [facility];  Service: Endoscopy;  Laterality: N/A;   ESOPHAGOGASTRODUODENOSCOPY N/A 03/19/2014   Procedure: ESOPHAGOGASTRODUODENOSCOPY (EGD);  Surgeon: Milus Banister, MD;  Location: [facility];  Service: Endoscopy;  Laterality: N/A;   ESOPHAGOGASTRODUODENOSCOPY N/A 07/08/2015   Procedure: ESOPHAGOGASTRODUODENOSCOPY (EGD);  Surgeon: Doran Stabler, MD;  Location: [facility];  Service: Endoscopy;  Laterality: N/A;   ESOPHAGOGASTRODUODENOSCOPY (EGD) WITH PROPOFOL N/A 09/15/2015   Procedure: ESOPHAGOGASTRODUODENOSCOPY (EGD) WITH PROPOFOL;  Surgeon: Manus Gunning, MD;  Location: [facility];  Service: Gastroenterology;  Laterality: N/A;   GASTROJEJUNOSTOMY     hx/notes 01/10/2015   GASTROJEJUNOSTOMY N/A 09/23/2015  Procedure: OPEN GASTROJEJUNOSTOMY TUBE PLACEMENT;  Surgeon: Arta Bruce Kinsinger, MD;  Location: [facility];  Service: General;  Laterality: N/A;   GLAUCOMA SURGERY Bilateral    HERNIA REPAIR  2015   IR CM INJ ANY COLONIC TUBE W/FLUORO  01/05/2017   IR CM INJ ANY COLONIC TUBE W/FLUORO  06/07/2017   IR CM INJ ANY COLONIC TUBE W/FLUORO  11/05/2017   IR CM INJ ANY COLONIC TUBE W/FLUORO  09/18/2018   IR CM INJ ANY COLONIC TUBE W/FLUORO  03/06/2019   IR CM INJ ANY COLONIC TUBE W/FLUORO  03/11/2019   IR GASTR TUBE CONVERT GASTR-JEJ PER W/FL MOD SED  04/30/2020   IR GENERIC HISTORICAL  01/07/2016   IR GJ TUBE CHANGE 01/07/2016 Jacqulynn Cadet, MD [facility]   IR GENERIC HISTORICAL  01/27/2016   IR MECH REMOV OBSTRUC MAT ANY COLON TUBE W/FLUORO 01/27/2016 Markus Daft, MD [facility]   IR GENERIC HISTORICAL  02/07/2016   IR PATIENT EVAL TECH 0-60 MINS Darrell K Allred, PA-C [facility]   IR GENERIC HISTORICAL  02/08/2016   IR GJ TUBE CHANGE 02/08/2016 Greggory Keen, MD [facility]   IR GENERIC HISTORICAL  01/06/2016   IR GJ TUBE CHANGE 01/06/2016 [facility] OUT REF   IR GENERIC HISTORICAL   05/02/2016   IR CM INJ ANY COLONIC TUBE W/FLUORO 05/02/2016 Arne Cleveland, MD [facility]   IR GENERIC HISTORICAL  05/15/2016   IR GJ TUBE CHANGE 05/15/2016 Sandi Mariscal, MD [facility]   IR GENERIC HISTORICAL  06/28/2016   IR GJ TUBE CHANGE 06/28/2016 [facility]   IR GJ TUBE CHANGE  02/20/2017   IR GJ TUBE CHANGE  05/10/2017   IR GJ TUBE CHANGE  07/06/2017   IR GJ TUBE CHANGE  08/02/2017   IR GJ TUBE CHANGE  10/15/2017   IR GJ TUBE CHANGE  12/26/2017   IR GJ TUBE CHANGE  04/08/2018   IR GJ TUBE CHANGE  06/19/2018   IR GJ TUBE CHANGE  03/03/2019   IR GJ TUBE CHANGE  06/06/2019   IR GJ TUBE CHANGE  09/26/2019   IR GJ TUBE CHANGE  01/09/2020   IR GJ TUBE CHANGE  05/12/2020   IR GJ TUBE CHANGE  10/05/2020   IR PATIENT EVAL TECH 0-60 MINS  10/19/2016   IR PATIENT EVAL TECH 0-60 MINS  12/25/2016   IR PATIENT EVAL TECH 0-60 MINS  01/29/2017   IR PATIENT EVAL TECH 0-60 MINS  04/04/2017   IR PATIENT EVAL TECH 0-60 MINS  04/30/2017   IR PATIENT EVAL TECH 0-60 MINS  07/31/2017   IR REPLC DUODEN/JEJUNO TUBE PERCUT W/FLUORO  11/14/2016   LAPAROTOMY N/A 01/20/2015   Procedure: EXPLORATORY LAPAROTOMY;  Surgeon: Coralie Keens, MD;  Location: [facility];  Service: General;  Laterality: N/A;   LOWER EXTREMITY ANGIOGRAPHY Left 09/21/2016   Procedure: Lower Extremity Angiography;  Surgeon: Waynetta Sandy, MD;  Location: [facility];  Service: Cardiovascular;  Laterality: Left;   LOWER EXTREMITY ANGIOGRAPHY Right 06/27/2017   Procedure: Lower Extremity Angiography;  Surgeon: Waynetta Sandy, MD;  Location: [place] [facility];  Service: Cardiovascular;  Laterality: Right;   LYSIS OF ADHESION N/A 01/20/2015   Procedure: LYSIS OF ADHESIONS < 1 HOUR;  Surgeon: Coralie Keens, MD;  Location: [facility];  Service: General;  Laterality: N/A;   PERIPHERAL VASCULAR BALLOON ANGIOPLASTY Left 09/21/2016   Procedure: Peripheral Vascular Balloon Angioplasty;  Surgeon: Waynetta Sandy, MD;  Location: [place] [facility];  Service:  Cardiovascular;  Laterality: Left;  drug coated balloon   PERIPHERAL VASCULAR BALLOON ANGIOPLASTY Right 06/27/2017   Procedure: PERIPHERAL VASCULAR BALLOON ANGIOPLASTY;  Surgeon: Waynetta Sandy, MD;  Location: [facility] [facility];  Service: Cardiovascular;  Laterality: Right;  [id number]   TONSILLECTOMY     TRANSMETATARSAL AMPUTATION Right 11/14/2019   Procedure: TRANSMETATARSAL AMPUTATION RIGHT FOOT;  Surgeon: Edrick Kins, DPM;  Location: [facility];  Service: Podiatry;  Laterality: Right;   TUBAL LIGATION     VENTRAL HERNIA REPAIR  2015   incarcerated ventral hernia ([facility] 09/2013)/notes 01/10/2015    Family History  Problem Relation Age of Onset   Stroke Mother    Hypertension Mother    Diabetes Brother    Glaucoma Son    Glaucoma Son    Heart attack Neg Hx    Colon cancer Neg Hx    Stomach cancer Neg Hx      Social History   Tobacco Use   Smoking status: Never   Smokeless tobacco: Former    Types: Snuff    Quit date: 04/03/1980   Tobacco comments:    "used snuff in my younger days"  Vaping Use   Vaping Use: Never used  Substance Use Topics   Alcohol use: Never    Alcohol/week: 0.0 standard drinks   Drug use: Never    Prior to Admission medications   Medication Sig Start Date End Date Taking? Authorizing Provider  acetaminophen (TYLENOL) 500 MG tablet Take 500 mg by mouth every 6 (six) hours as needed for moderate pain.   Yes [provider]  amLODipine (NORVASC) 5 MG tablet Take 1 tablet (5 mg total) by mouth daily. 10/20/19 10/19/20 Yes Medina-Vargas, Monina C, NP  atorvastatin (LIPITOR) 10 MG tablet TAKE 1 TABLET BY MOUTH EVERY DAY Patient taking differently: Take 10 mg by mouth daily. 06/24/20  Yes [name], NP  b complex vitamins tablet Take 1 tablet by mouth daily.    Yes [provider]  benzocaine (AMERICAINE) 20 % rectal ointment Place rectally every 3 (three) hours as needed for pain. 09/24/19  Yes Fawze,  Mina A, PA-C  cholecalciferol (VITAMIN D3) 25 MCG (1000 UNIT) tablet Take 1,000 Units by mouth daily.   Yes [provider]  cycloSPORINE (RESTASIS) 0.05 % ophthalmic emulsion USE 1 DROP INTO BOTH EYES TWICE DAILY Patient taking differently: Place 1 drop into both eyes 2 (two) times daily. 01/29/17  Yes Eulas Post, Monica, DO  DULoxetine (CYMBALTA) 30 MG capsule TAKE 1 CAPSULE BY MOUTH EVERY DAY Patient taking differently: Take 30 mg by mouth daily. 05/05/20  Yes [name], NP  ferrous sulfate 220 (44 Fe) MG/5ML solution TAKE 7.4 MLS (325 MG TOTAL) BY MOUTH DAILY. 03/04/20  Yes [name], NP  fluticasone (FLONASE) 50 MCG/ACT nasal spray SPRAY 2 SPRAYS INTO EACH NOSTRIL EVERY DAY Patient taking differently: Place 2 sprays into both nostrils in the morning and at bedtime. 06/25/19  Yes [name], NP  furosemide (LASIX) 40 MG tablet TAKE 0.5-1 TABLETS (20-40 MG TOTAL) BY MOUTH SEE ADMIN INSTRUCTIONS. TAKE 20 MG DAILY, MAY INSTEAD TAKE 40 MG DAILY AS NEEDED FOR EDEMA 08/04/20  Yes Freada Bergeron, MD  gabapentin (NEURONTIN) 300 MG capsule Take 1 capsule (300 mg total) by mouth 2 (two) times daily. 05/17/20  Yes [name], NP  gentamicin cream (GARAMYCIN) 0.1 % Apply 1 application topically daily as needed (dry skin). 10/01/19  Yes Edrick Kins, DPM  hydrALAZINE (APRESOLINE)  50 MG tablet TAKE 1 TABLET BY MOUTH THREE TIMES A DAY Patient taking differently: Take 50 mg by mouth 3 (three) times daily. 04/26/20  Yes Dorothy Spark, MD  insulin aspart (NOVOLOG) 100 UNIT/ML FlexPen Inject 2-6 Units into the skin as needed for high blood sugar. New sliding Scale If BS is 151 - 251 = 2 units,  If BS is 251 -  350 = 4 units,  If BS is 351 - 450 = 6 units Call provider if BS runs more than 400   Yes [provider]  Insulin Pen Needle (B-D UF III MINI PEN NEEDLES) 31G X 5 MM MISC Use twice daily with giving insulin injections. Dx E11.9 07/17/18  Yes [name], NP  ipratropium-albuterol (DUONEB) 0.5-2.5 (3) MG/3ML SOLN Take 3 mLs by nebulization every 6 (six) hours as needed (shortness of breath).   Yes [provider]  isosorbide mononitrate (IMDUR) 30 MG 24 hr tablet TAKE 2 TABLETS BY MOUTH DAILY. Patient taking differently: Take 60 mg by mouth in the morning. 09/20/20  Yes Pemberton, Greer Ee, MD  LANTUS SOLOSTAR 100 UNIT/ML Solostar Pen INJECT 8 UNITS INTO THE SKIN AT BEDTIME. Patient taking differently: Inject 8 Units into the skin at bedtime as needed (blood sugar 150 or above). 05/10/20  Yes [name], NP  latanoprost (XALATAN) 0.005 % ophthalmic solution Place 1 drop into both eyes at bedtime.   Yes [provider]  levalbuterol (XOPENEX HFA) 45 MCG/ACT inhaler Inhale 2 puffs into the lungs every 6 (six) hours as needed for wheezing.   Yes [provider]  levothyroxine (SYNTHROID) 125 MCG tablet TAKE 1 TABLET BY MOUTH EVERY DAY Patient taking differently: Take 125 mcg by mouth daily before breakfast. 06/24/20  Yes Eubanks, [name], NP  Lidocaine-Glycerin (PREPARATION H EX) Apply 1 application topically 2 (two) times daily as needed (hemorrhoids).   Yes [provider]  loratadine (CLARITIN) 10 MG tablet Take 10 mg by mouth daily.   Yes [provider]  mometasone-formoterol (DULERA) 200-5 MCG/ACT AERO INHALE TWO PUFFS BY MOUTH TWICE DAILY Patient taking differently: Inhale 2 puffs into the lungs in the morning and at bedtime. 10/05/20  Yes [name], NP  Multiple Vitamin (MULTIVITAMIN WITH MINERALS) TABS tablet Take 1 tablet by mouth daily.   Yes [provider]  Nutritional Supplements (FEEDING SUPPLEMENT, OSMOLITE 1.5 CAL,) LIQD Place 711 mLs into feeding tube See admin instructions. Take 711 ml over a 12 hour period per day 9 am-9 pm at a rate of 65   Yes [provider]  nystatin (MYCOSTATIN) 100000 UNIT/ML suspension Take 5 mLs (500,000 Units total) by mouth 4  (four) times daily. Patient taking differently: Take 5 mLs by mouth 4 (four) times daily as needed (thrush). 09/06/20  Yes [name], NP  nystatin (MYCOSTATIN/NYSTOP) powder Apply 1 application topically as needed (skin irritation). 09/11/19  Yes Ngetich, Dinah C, NP  nystatin cream (MYCOSTATIN) APPLY TO AFFECTED AREA TWICE A DAY Patient taking differently: Apply 1 application topically at bedtime. 05/10/20  Yes [name], NP  oxyCODONE (ROXICODONE) 5 MG immediate release tablet Take 0.5-1 tablets (2.5-5 mg total) by mouth every 8 (eight) hours as needed for severe pain. 10/05/20  Yes [name], NP  pantoprazole (PROTONIX) 40 MG tablet Take 1 tablet (40 mg total) by mouth daily. Patient taking differently: Take 40 mg by mouth daily with supper. 09/23/20  Yes Freada Bergeron, MD  Polyethyl Glycol-Propyl  Glycol (SYSTANE) 0.4-0.3 % GEL ophthalmic gel Place 1 application into both eyes 2 (two) times daily as needed (moisture).   Yes [provider]  Probiotic Product (EQL PROBIOTIC COLON SUPPORT PO) Take 1 capsule by mouth daily.   Yes [provider]  senna-docusate (SENOKOT-S) 8.6-50 MG tablet Take 1 tablet by mouth as needed for mild constipation.   Yes [provider]  shark liver oil-cocoa butter (PREPARATION H) 0.25-3-85.5 % suppository Place 1 suppository rectally as needed for hemorrhoids.   Yes [provider]  warfarin (COUMADIN) 2.5 MG tablet TAKE 2-3 TABLETS BY MOUTH DAILY AS DIRECTED BY COUMADIN CLINIC. Patient taking differently: Take 5-7.5 mg by mouth See admin instructions. Take 5 mg by mouth on Monday, Tuesday, Wednesday, Friday and Satuday, take 7.5 mg on Sunday and Thursday 05/18/20  Yes Dorothy Spark, MD  Water For Irrigation, Sterile (FREE WATER) SOLN Place 180 mLs into feeding tube 3 (three) times daily.   Yes [provider]  White Petrolatum-Mineral Oil (SYSTANE NIGHTTIME) OINT Place 1 application into both  eyes at bedtime.    Yes [provider]    Current Facility-Administered Medications  Medication Dose Route Frequency Provider Last Rate Last Admin   0.9 %  sodium chloride infusion   Intravenous Continuous Nita Sells, MD 50 mL/hr at 10/11/20 0833 New Bag at 10/11/20 0833   acetaminophen (TYLENOL) tablet 500 mg  500 mg Oral Q6H PRN Norins, Heinz Knuckles, MD       amLODipine (NORVASC) tablet 5 mg  5 mg Oral Daily Norins, Heinz Knuckles, MD       artificial tears (LACRILUBE) ophthalmic ointment 1 application  1 application Both Eyes QHS Norins, Heinz Knuckles, MD       cycloSPORINE (RESTASIS) 0.05 % ophthalmic emulsion 1 drop  1 drop Both Eyes BID Norins, Heinz Knuckles, MD       DULoxetine (CYMBALTA) DR capsule 30 mg  30 mg Oral Daily Norins, Heinz Knuckles, MD       free water 180 mL  180 mL Per Tube TID Norins, Heinz Knuckles, MD       furosemide (LASIX) tablet 20 mg  20 mg Oral Daily Norins, Heinz Knuckles, MD       furosemide (LASIX) tablet 20 mg  20 mg Oral Daily PRN Palumbo, April, MD       gabapentin (NEURONTIN) capsule 300 mg  300 mg Oral BID Norins, Heinz Knuckles, MD   300 mg at 10/11/20 0257   hydrALAZINE (APRESOLINE) tablet 50 mg  50 mg Oral TID Norins, Heinz Knuckles, MD       insulin glargine (LANTUS) injection 8 Units  8 Units Subcutaneous QHS Norins, Heinz Knuckles, MD       ipratropium-albuterol (DUONEB) 0.5-2.5 (3) MG/3ML nebulizer solution 3 mL  3 mL Nebulization Q6H PRN Norins, Heinz Knuckles, MD       isosorbide mononitrate (IMDUR) 24 hr tablet 60 mg  60 mg Oral q AM Norins, Heinz Knuckles, MD   60 mg at 10/11/20 0900   latanoprost (XALATAN) 0.005 % ophthalmic solution 1 drop  1 drop Both Eyes QHS Norins, Heinz Knuckles, MD       levalbuterol (XOPENEX) nebulizer solution 0.63 mg  0.63 mg Inhalation Q6H PRN Norins, Heinz Knuckles, MD       levothyroxine (SYNTHROID) tablet 125 mcg  125 mcg Oral Q0600 Neena Rhymes, MD   125 mcg at 10/11/20 0900   mometasone-formoterol (DULERA) 200-5 MCG/ACT inhaler 2 puff  2 puff  Inhalation BID Neena Rhymes, MD   2 puff at 10/11/20 0857   nystatin (MYCOSTATIN) 100000 UNIT/ML suspension 500,000 Units  5 mL Oral QID PRN Norins, Heinz Knuckles, MD       nystatin (MYCOSTATIN/NYSTOP) topical powder 1 application  1 application Topical PRN Norins, Heinz Knuckles, MD       oxyCODONE (Oxy IR/ROXICODONE) immediate release tablet 2.5-5 mg  2.5-5 mg Oral Q8H PRN Norins, Heinz Knuckles, MD       [START ON 10/14/2020] pantoprazole (PROTONIX) injection 40 mg  40 mg Intravenous Q12H Norins, Heinz Knuckles, MD       pantoprozole (PROTONIX) 80 mg /NS 100 mL infusion  8 mg/hr Intravenous Continuous Norins, Heinz Knuckles, MD 10 mL/hr at 10/11/20 0339 8 mg/hr at 10/11/20 4650   polyvinyl alcohol (LIQUIFILM TEARS) 1.4 % ophthalmic solution 1 drop  1 drop Both Eyes BID PRN Norins, Heinz Knuckles, MD       Current Outpatient Medications  Medication Sig Dispense Refill   acetaminophen (TYLENOL) 500 MG tablet Take 500 mg by mouth every 6 (six) hours as needed for moderate pain.     amLODipine (NORVASC) 5 MG tablet Take 1 tablet (5 mg total) by mouth daily. 30 tablet 11   atorvastatin (LIPITOR) 10 MG tablet TAKE 1 TABLET BY MOUTH EVERY DAY (Patient taking differently: Take 10 mg by mouth daily.) 90 tablet 0   b complex vitamins tablet Take 1 tablet by mouth daily.      benzocaine (AMERICAINE) 20 % rectal ointment Place rectally every 3 (three) hours as needed for pain. 28.4 g 0   cholecalciferol (VITAMIN D3) 25 MCG (1000 UNIT) tablet Take 1,000 Units by mouth daily.     cycloSPORINE (RESTASIS) 0.05 % ophthalmic emulsion USE 1 DROP INTO BOTH EYES TWICE DAILY (Patient taking differently: Place 1 drop into both eyes 2 (two) times daily.) 60 mL 3   DULoxetine (CYMBALTA) 30 MG capsule TAKE 1 CAPSULE BY MOUTH EVERY DAY (Patient taking differently: Take 30 mg by mouth daily.) 90 capsule 2   ferrous sulfate 220 (44 Fe) MG/5ML solution TAKE 7.4 MLS (325 MG TOTAL) BY MOUTH DAILY. 150 mL 11   fluticasone (FLONASE) 50 MCG/ACT nasal  spray SPRAY 2 SPRAYS INTO EACH NOSTRIL EVERY DAY (Patient taking differently: Place 2 sprays into both nostrils in the morning and at bedtime.) 48 mL 1   furosemide (LASIX) 40 MG tablet TAKE 0.5-1 TABLETS (20-40 MG TOTAL) BY MOUTH SEE ADMIN INSTRUCTIONS. TAKE 20 MG DAILY, MAY INSTEAD TAKE 40 MG DAILY AS NEEDED FOR EDEMA 30 tablet 8   gabapentin (NEURONTIN) 300 MG capsule Take 1 capsule (300 mg total) by mouth 2 (two) times daily. 180 capsule 1   gentamicin cream (GARAMYCIN) 0.1 % Apply 1 application topically daily as needed (dry skin). 30 g 1   hydrALAZINE (APRESOLINE) 50 MG tablet TAKE 1 TABLET BY MOUTH THREE TIMES A DAY (Patient taking differently: Take 50 mg by mouth 3 (three) times daily.) 270 tablet 2   insulin aspart (NOVOLOG) 100 UNIT/ML FlexPen Inject 2-6 Units into the skin as needed for high blood sugar. New sliding Scale If BS is 151 - 251 = 2 units,  If BS is 251 -  350 = 4 units,  If BS is 351 - 450 = 6 units Call provider if BS runs more than 400     Insulin Pen Needle (B-D UF III MINI PEN NEEDLES) 31G X 5 MM MISC Use twice daily with giving insulin injections. Dx  E11.9 300 each 3   ipratropium-albuterol (DUONEB) 0.5-2.5 (3) MG/3ML SOLN Take 3 mLs by nebulization every 6 (six) hours as needed (shortness of breath).     isosorbide mononitrate (IMDUR) 30 MG 24 hr tablet TAKE 2 TABLETS BY MOUTH DAILY. (Patient taking differently: Take 60 mg by mouth in the morning.) 180 tablet 2   LANTUS SOLOSTAR 100 UNIT/ML Solostar Pen INJECT 8 UNITS INTO THE SKIN AT BEDTIME. (Patient taking differently: Inject 8 Units into the skin at bedtime as needed (blood sugar 150 or above).) 3 mL 3   latanoprost (XALATAN) 0.005 % ophthalmic solution Place 1 drop into both eyes at bedtime.     levalbuterol (XOPENEX HFA) 45 MCG/ACT inhaler Inhale 2 puffs into the lungs every 6 (six) hours as needed for wheezing.     levothyroxine (SYNTHROID) 125 MCG tablet TAKE 1 TABLET BY MOUTH EVERY DAY (Patient taking differently:  Take 125 mcg by mouth daily before breakfast.) 90 tablet 0   Lidocaine-Glycerin (PREPARATION H EX) Apply 1 application topically 2 (two) times daily as needed (hemorrhoids).     loratadine (CLARITIN) 10 MG tablet Take 10 mg by mouth daily.     mometasone-formoterol (DULERA) 200-5 MCG/ACT AERO INHALE TWO PUFFS BY MOUTH TWICE DAILY (Patient taking differently: Inhale 2 puffs into the lungs in the morning and at bedtime.) 13 each 5   Multiple Vitamin (MULTIVITAMIN WITH MINERALS) TABS tablet Take 1 tablet by mouth daily.     Nutritional Supplements (FEEDING SUPPLEMENT, OSMOLITE 1.5 CAL,) LIQD Place 711 mLs into feeding tube See admin instructions. Take 711 ml over a 12 hour period per day 9 am-9 pm at a rate of 65     nystatin (MYCOSTATIN) 100000 UNIT/ML suspension Take 5 mLs (500,000 Units total) by mouth 4 (four) times daily. (Patient taking differently: Take 5 mLs by mouth 4 (four) times daily as needed (thrush).) 473 mL 0   nystatin (MYCOSTATIN/NYSTOP) powder Apply 1 application topically as needed (skin irritation). 15 g 0   nystatin cream (MYCOSTATIN) APPLY TO AFFECTED AREA TWICE A DAY (Patient taking differently: Apply 1 application topically at bedtime.) 30 g 11   oxyCODONE (ROXICODONE) 5 MG immediate release tablet Take 0.5-1 tablets (2.5-5 mg total) by mouth every 8 (eight) hours as needed for severe pain. 30 tablet 0   pantoprazole (PROTONIX) 40 MG tablet Take 1 tablet (40 mg total) by mouth daily. (Patient taking differently: Take 40 mg by mouth daily with supper.) 90 tablet 2   Polyethyl Glycol-Propyl Glycol (SYSTANE) 0.4-0.3 % GEL ophthalmic gel Place 1 application into both eyes 2 (two) times daily as needed (moisture).     Probiotic Product (EQL PROBIOTIC COLON SUPPORT PO) Take 1 capsule by mouth daily.     senna-docusate (SENOKOT-S) 8.6-50 MG tablet Take 1 tablet by mouth as needed for mild constipation.     shark liver oil-cocoa butter (PREPARATION H) 0.25-3-85.5 % suppository Place 1  suppository rectally as needed for hemorrhoids.     warfarin (COUMADIN) 2.5 MG tablet TAKE 2-3 TABLETS BY MOUTH DAILY AS DIRECTED BY COUMADIN CLINIC. (Patient taking differently: Take 5-7.5 mg by mouth See admin instructions. Take 5 mg by mouth on Monday, Tuesday, Wednesday, Friday and Satuday, take 7.5 mg on Sunday and Thursday) 215 tablet 1   Water For Irrigation, Sterile (FREE WATER) SOLN Place 180 mLs into feeding tube 3 (three) times daily.     White Petrolatum-Mineral Oil (SYSTANE NIGHTTIME) OINT Place 1 application into both eyes at bedtime.  Allergies as of 10/10/2020 - Review Complete 10/10/2020  Allergen Reaction Noted   Penicillins Itching, Rash, and Other (See Comments) 02/12/2016     Review of Systems:    This is positive for those things mentioned in the HPI. All other review of systems are negative.       Physical Exam:  Vital signs in last 24 hours: Temp:  [97.5 F (36.4 C)-98.1 F (36.7 C)] 98 F (36.7 C) (07/11 0913) Pulse Rate:  [64-75] 71 (07/11 0913) Resp:  [9-29] 22 (07/11 0913) BP: (128-179)/(48-75) 152/64 (07/11 0913) SpO2:  [90 %-100 %] 100 % (07/11 0913) Weight:  [54.4 kg] 54.4 kg (07/10 2222)    General:  Elderly alert younger than stated age black woman n no acute distress Eyes:  anicteric.  Pale conjunctiva ENT:   Mouth and posterior pharynx free of lesions.  Edentulous  Lungs: Clear to auscultation bilaterally.  Anterior Heart:   S1S2, no rubs, murmurs, gallops. Abdomen: Thin soft, non-tender, no hepatosplenomegaly,  and BS+.  Easily reducible midline ventral hernia status postrepair there is a gastrostomy tube with a jejunostomy tube through it in the left upper quadrant it is bandaged the bandages are clean.  There is dark red bloody fluid in the tube. Rectal: In the presence of female nursing staff rectal exam shows a normal anoderm.  Slightly tender no mass there is scanty maroon stool.  Neuro:  A&O x 3.  Psych:  appropriate mood and   Affect.   Data Reviewed:   LAB RESULTS: Recent Labs    10/10/20 2224  WBC 4.9  HGB 6.9*  HCT 21.2*  PLT 226   BMET Recent Labs    10/10/20 2224  NA 135  K 4.7  CL 103  CO2 22  GLUCOSE 120*  BUN 77*  CREATININE 1.76*  CALCIUM 8.8*   LFT  PT/INR Recent Labs    10/10/20 0356 10/10/20 2224  LABPROT 33.7* 30.0*  INR 3.3* 2.9*      PREVIOUS ENDOSCOPIES:            See HPI   Thanks   LOS: 0 days   @Victorine Mcnee  Simonne Maffucci, MD, [facility] @  10/11/2020, 9:33 AM

## 2020-10-11 NOTE — ED Notes (Signed)
PATIENT IS GETTING A UNIT OF BLOOD AT THIS TIME.

## 2020-10-11 NOTE — ED Notes (Signed)
Informed Dr. Lars Masson of pt's POSITIVE occult blood result.

## 2020-10-11 NOTE — Hospital Course (Addendum)
  Dressing procedure/placement/frequency: Daily wound care to be provided using a soap and water cleanse, rinse and pat dry followed by placement of a xeroform gauze wound contact layer topped with dry gauze and secured with Kerlix roll gauze/paper tape.  Feet are to be placed into Prevalon pressure redistribution heel boots for protection and floatation of heels.  Turning and repositioning as well as placement of a sacral prophylactic foam dressing will be employed to prevent sacral pressure injury.  The patients multiple risk factors for pressure injury are noted.  I proximal stomach Recommendation:           - Return patient to hospital ward for ongoing care.                           - CHNAGE RX TO BID PPI AND SUCRALFATE                           REFLUX PRECAUTIONS                           OK TO RESUME WARFARIN IF THOUGHT APPROPRIATE -                           HEMORRHAGE WAS FROM INR 7+ AND ESOPHAGITIS                           OK TO DC NOW FROM MY STANDPOINT                           OK TO RESTART G-J TUBE FEEDS

## 2020-10-12 ENCOUNTER — Inpatient Hospital Stay (HOSPITAL_COMMUNITY): Payer: Medicare Other | Admitting: Anesthesiology

## 2020-10-12 ENCOUNTER — Encounter (HOSPITAL_COMMUNITY): Payer: Self-pay | Admitting: Internal Medicine

## 2020-10-12 ENCOUNTER — Encounter (HOSPITAL_COMMUNITY)
Admission: EM | Disposition: A | Discharge status: Home / Self Care | Payer: Self-pay | Source: Home / Self Care | Attending: Family Medicine

## 2020-10-12 ENCOUNTER — Ambulatory Visit: Payer: Self-pay | Admitting: *Deleted

## 2020-10-12 ENCOUNTER — Other Ambulatory Visit: Payer: Medicare Other

## 2020-10-12 DIAGNOSIS — K317 Polyp of stomach and duodenum: Secondary | ICD-10-CM | POA: Diagnosis not present

## 2020-10-12 DIAGNOSIS — K222 Esophageal obstruction: Secondary | ICD-10-CM | POA: Diagnosis not present

## 2020-10-12 DIAGNOSIS — K921 Melena: Secondary | ICD-10-CM | POA: Diagnosis not present

## 2020-10-12 HISTORY — PX: ESOPHAGOGASTRODUODENOSCOPY (EGD) WITH PROPOFOL: SHX5813

## 2020-10-12 LAB — CBC
HCT: 29.8 % — ABNORMAL LOW (ref 36.0–46.0)
Hemoglobin: 10.1 g/dL — ABNORMAL LOW (ref 12.0–15.0)
MCH: 28.6 pg (ref 26.0–34.0)
MCHC: 33.9 g/dL (ref 30.0–36.0)
MCV: 84.4 fL (ref 80.0–100.0)
Platelets: 203 10*3/uL (ref 150–400)
RBC: 3.53 MIL/uL — ABNORMAL LOW (ref 3.87–5.11)
RDW: 20.8 % — ABNORMAL HIGH (ref 11.5–15.5)
WBC: 6.9 10*3/uL (ref 4.0–10.5)
nRBC: 0 % (ref 0.0–0.2)

## 2020-10-12 LAB — CBC WITH DIFFERENTIAL/PLATELET
Abs Immature Granulocytes: 0.02 10*3/uL (ref 0.00–0.07)
Basophils Absolute: 0 10*3/uL (ref 0.0–0.1)
Basophils Relative: 1 %
Eosinophils Absolute: 0.3 10*3/uL (ref 0.0–0.5)
Eosinophils Relative: 6 %
HCT: 31.2 % — ABNORMAL LOW (ref 36.0–46.0)
Hemoglobin: 10.6 g/dL — ABNORMAL LOW (ref 12.0–15.0)
Immature Granulocytes: 0 %
Lymphocytes Relative: 11 %
Lymphs Abs: 0.7 10*3/uL (ref 0.7–4.0)
MCH: 28.4 pg (ref 26.0–34.0)
MCHC: 34 g/dL (ref 30.0–36.0)
MCV: 83.6 fL (ref 80.0–100.0)
Monocytes Absolute: 0.7 10*3/uL (ref 0.1–1.0)
Monocytes Relative: 12 %
Neutro Abs: 4.4 10*3/uL (ref 1.7–7.7)
Neutrophils Relative %: 70 %
Platelets: 195 10*3/uL (ref 150–400)
RBC: 3.73 MIL/uL — ABNORMAL LOW (ref 3.87–5.11)
RDW: 21.5 % — ABNORMAL HIGH (ref 11.5–15.5)
WBC: 6.2 10*3/uL (ref 4.0–10.5)
nRBC: 0 % (ref 0.0–0.2)

## 2020-10-12 LAB — BPAM RBC
Blood Product Expiration Date: 202208032359
Blood Product Expiration Date: 202208172359
ISSUE DATE / TIME: 202207110332
ISSUE DATE / TIME: 202207110840
Unit Type and Rh: 5100
Unit Type and Rh: 6200

## 2020-10-12 LAB — COMPREHENSIVE METABOLIC PANEL
ALT: 18 U/L (ref 0–44)
AST: 23 U/L (ref 15–41)
Albumin: 2.9 g/dL — ABNORMAL LOW (ref 3.5–5.0)
Alkaline Phosphatase: 66 U/L (ref 38–126)
Anion gap: 7 (ref 5–15)
BUN: 57 mg/dL — ABNORMAL HIGH (ref 8–23)
CO2: 27 mmol/L (ref 22–32)
Calcium: 8.9 mg/dL (ref 8.9–10.3)
Chloride: 99 mmol/L (ref 98–111)
Creatinine, Ser: 1.45 mg/dL — ABNORMAL HIGH (ref 0.44–1.00)
GFR, Estimated: 33 mL/min — ABNORMAL LOW (ref >60)
Glucose, Bld: 117 mg/dL — ABNORMAL HIGH (ref 70–99)
Potassium: 4 mmol/L (ref 3.5–5.1)
Sodium: 133 mmol/L — ABNORMAL LOW (ref 135–145)
Total Bilirubin: 1 mg/dL (ref 0.3–1.2)
Total Protein: 6.9 g/dL (ref 6.5–8.1)

## 2020-10-12 LAB — TYPE AND SCREEN
ABO/RH(D): AB POS
Antibody Screen: POSITIVE
Donor AG Type: NEGATIVE
Donor AG Type: NEGATIVE
Unit division: 0
Unit division: 0

## 2020-10-12 LAB — GLUCOSE, CAPILLARY
Glucose-Capillary: 171 mg/dL — ABNORMAL HIGH (ref 70–99)
Glucose-Capillary: 71 mg/dL (ref 70–99)
Glucose-Capillary: 88 mg/dL (ref 70–99)

## 2020-10-12 LAB — PROTIME-INR
INR: 1.3 — ABNORMAL HIGH (ref 0.8–1.2)
Prothrombin Time: 16.1 seconds — ABNORMAL HIGH (ref 11.4–15.2)

## 2020-10-12 LAB — HEMOGLOBIN AND HEMATOCRIT, BLOOD
HCT: 30.3 % — ABNORMAL LOW (ref 36.0–46.0)
HCT: 31.1 % — ABNORMAL LOW (ref 36.0–46.0)
Hemoglobin: 10.3 g/dL — ABNORMAL LOW (ref 12.0–15.0)
Hemoglobin: 10.2 g/dL — ABNORMAL LOW (ref 12.0–15.0)

## 2020-10-12 SURGERY — ESOPHAGOGASTRODUODENOSCOPY (EGD) WITH PROPOFOL
Anesthesia: Monitor Anesthesia Care

## 2020-10-12 MED ORDER — PANTOPRAZOLE SODIUM 40 MG PO TBEC
40.0000 mg | DELAYED_RELEASE_TABLET | Freq: Two times a day (BID) | ORAL | Status: DC
  Administered 2020-10-12 – 2020-10-14 (×4): 40 mg via ORAL
  Filled 2020-10-12 (×4): qty 1

## 2020-10-12 MED ORDER — SUCRALFATE 1 G PO TABS
1.0000 g | ORAL_TABLET | Freq: Three times a day (TID) | ORAL | Status: DC
  Administered 2020-10-12 – 2020-10-14 (×9): 1 g via ORAL
  Filled 2020-10-12 (×9): qty 1

## 2020-10-12 MED ORDER — PROPOFOL 500 MG/50ML IV EMUL
INTRAVENOUS | Status: DC | PRN
  Administered 2020-10-12: 100 ug/kg/min via INTRAVENOUS

## 2020-10-12 MED ORDER — LIDOCAINE 2% (20 MG/ML) 5 ML SYRINGE
INTRAMUSCULAR | Status: DC | PRN
  Administered 2020-10-12: 40 mg via INTRAVENOUS

## 2020-10-12 MED ORDER — LACTATED RINGERS IV SOLN: INTRAVENOUS | Status: DC

## 2020-10-12 MED ORDER — PROPOFOL 10 MG/ML IV BOLUS
INTRAVENOUS | Status: DC | PRN
  Administered 2020-10-12: 10 mg via INTRAVENOUS

## 2020-10-12 MED ORDER — DEXTROSE 50 % IV SOLN
25.0000 mL | Freq: Once | INTRAVENOUS | Status: AC
  Administered 2020-10-12: 25 mL via INTRAVENOUS

## 2020-10-12 MED ORDER — SUCRALFATE 1 G PO TABS: 1.0000 g | ORAL_TABLET | Freq: Three times a day (TID) | ORAL | 0 refills | Status: DC

## 2020-10-12 SURGICAL SUPPLY — 15 items
BLOCK BITE 60FR ADLT L/F BLUE (MISCELLANEOUS) ×2 IMPLANT
ELECT REM PT RETURN 9FT ADLT (ELECTROSURGICAL)
ELECTRODE REM PT RTRN 9FT ADLT (ELECTROSURGICAL) IMPLANT
FORCEP RJ3 GP 1.8X160 W-NEEDLE (CUTTING FORCEPS) IMPLANT
FORCEPS BIOP RAD 4 LRG CAP 4 (CUTTING FORCEPS) IMPLANT
NDL SCLEROTHERAPY 25GX240 (NEEDLE) IMPLANT
NEEDLE SCLEROTHERAPY 25GX240 (NEEDLE) IMPLANT
PROBE APC STR FIRE (PROBE) IMPLANT
PROBE INJECTION GOLD (MISCELLANEOUS)
PROBE INJECTION GOLD 7FR (MISCELLANEOUS) IMPLANT
SNARE SHORT THROW 13M SML OVAL (MISCELLANEOUS) IMPLANT
SYR 50ML LL SCALE MARK (SYRINGE) IMPLANT
TUBING ENDO SMARTCAP PENTAX (MISCELLANEOUS) ×4 IMPLANT
TUBING IRRIGATION ENDOGATOR (MISCELLANEOUS) ×2 IMPLANT
WATER STERILE IRR 1000ML POUR (IV SOLUTION) IMPLANT

## 2020-10-12 NOTE — Op Note (Signed)
[facility] Patient Name: Shannon [name] Procedure Date : 10/12/2020 MRN: [id number] Attending MD: Iva Boop , MD Date of Birth: December 02, 1923 CSN: [id number] Age: 87 Admit Type: Inpatient Procedure:                Upper GI endoscopy Indications:              [place] Providers:                Iva Boop, MD, Estella Husk RN, RN, Beryle Beams, Technician, Albertina Senegal. Beckner, CRNA Referring MD:              Medicines:                Propofol per Anesthesia, Monitored Anesthesia Care Complications:            No immediate complications. Estimated Blood Loss:     Estimated blood loss was minimal. Procedure:                Pre-Anesthesia Assessment:                           - Prior to the procedure, a History and Physical                            was performed, and patient medications and                            allergies were reviewed. The patient's tolerance of                            previous anesthesia was also reviewed. The risks                            and benefits of the procedure and the sedation                            options and risks were discussed with the patient.                            All questions were answered, and informed consent                            was obtained. Prior Anticoagulants: The patient                            last took Coumadin (warfarin) 4 days prior to the                            procedure. ASA Grade Assessment: III - A patient                            with severe systemic disease. After reviewing the  risks and benefits, the patient was deemed in                            satisfactory condition to undergo the procedure.                           After obtaining informed consent, the endoscope was                            passed under direct vision. Throughout the                            procedure, the patient's blood pressure, pulse, and                             oxygen saturations were monitored continuously. The                            GIF-H190 ([id number]) Olympus gastroscope was                            introduced through the mouth, and advanced to the                            second part of duodenum. The upper GI endoscopy was                            accomplished without difficulty. The patient                            tolerated the procedure well. Scope In: Scope Out: Findings:      LA Grade C (one or more mucosal breaks continuous between tops of 2 or       more mucosal folds, less than 75% circumference) esophagitis with       bleeding was found in the mid esophagus.      One benign-appearing, intrinsic moderate (circumferential scarring or       stenosis; an endoscope may pass) stenosis was found at the       gastroesophageal junction. This stenosis measured 1.1 cm (inner       diameter). The stenosis was traversed.      Multiple diminutive sessile polyps with no bleeding and no stigmata of       recent bleeding were found in the gastric fundus and in the gastric body.      There was evidence of a gastrostomy present on the anterior wall of the       stomach.      The examined duodenum was normal.      The cardia and gastric fundus were otherwise normal on retroflexion. Impression:               - LA Grade C reflux esophagitis with bleeding.                           - Benign-appearing esophageal stenosis. Dilated  with scope - not dilated further as she does not                            eat solid food                           - Multiple gastric polyps.                           - Gastrostomy present.                           - Normal examined duodenum.                           - No specimens collected. Some iron pigment in                            proximal stomach Recommendation:           - Return patient to hospital ward for ongoing care.                           - CHNAGE  RX TO BID PPI AND SUCRALFATE                           REFLUX PRECAUTIONS                           OK TO RESUME WARFARIN IF THOUGHT APPROPRIATE -                            HEMORRHAGE WAS FROM INR 7+ AND ESOPHAGITIS                           OK TO DC NOW FROM MY STANDPOINT                           OK TO RESTART G-J TUBE FEEDS                           SEE GI PRN - SIGNING OFF I CALLED DAUGHTER Procedure Code(s):        --- Professional ---                           [phone], Esophagogastroduodenoscopy, flexible,                            transoral; diagnostic, including collection of                            specimen(s) by brushing or washing, when performed                            (separate procedure) Diagnosis Code(s):        --- Professional ---  K21.01, Gastro-esophageal reflux disease with                            esophagitis, with bleeding                           K22.2, Esophageal obstruction                           K31.7, Polyp of stomach and duodenum                           Z93.1, Gastrostomy status                           K92.1, Melena (includes Hematochezia) CPT copyright 2019 American [employer] Association. All rights reserved. The codes documented in this report are preliminary and upon coder review may  be revised to meet current compliance requirements. Iva Boop, MD 10/12/2020 8:32:12 AM This report has been signed electronically. Number of Addenda: 0

## 2020-10-12 NOTE — Consult Note (Addendum)
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  Patient has been followed in the community by Podiatric Medicine and in July was referred to [facility].  Dr. Stanford Breed saw on 10/05/20.  Please refer to the note from that encounter.  A conservative POC is indicated. Wound type: Arterial insufficiency Pressure Injury POA: N/A Measurement:To be obtained by bedside RN today prior to placement of first dressing change Wound YFV:CBSW red, moist, punctate presentation Drainage (amount, consistency, odor) small serous Periwound: intact with mild maceration Dressing procedure/placement/frequency: Daily wound care to be provided using a soap and water cleanse, rinse and pat dry followed by placement of a xeroform gauze wound contact layer topped with dry gauze and secured with Kerlix roll gauze/paper tape.  Feet are to be placed into Prevalon pressure redistribution heel boots for protection and floatation of heels.  Turning and repositioning as well as placement of a sacral prophylactic foam dressing will be employed to prevent sacral pressure injury.  The patients multiple risk factors for pressure injury are noted.  [place] nursing team will not follow, but will remain available to this patient, the nursing and medical teams.  Please re-consult if needed. Thanks, Maudie Flakes, MSN, RN, [place], Arther Abbott  Pager# [phone]

## 2020-10-12 NOTE — Anesthesia Postprocedure Evaluation (Signed)
Anesthesia Post Note  Patient: ANALIZ TVEDT  Procedure(s) Performed: ESOPHAGOGASTRODUODENOSCOPY (EGD) WITH PROPOFOL     Patient location during evaluation: Endoscopy Anesthesia Type: MAC Level of consciousness: awake and alert Pain management: pain level controlled Vital Signs Assessment: post-procedure vital signs reviewed and stable Respiratory status: spontaneous breathing, nonlabored ventilation, respiratory function stable and patient connected to nasal cannula oxygen Cardiovascular status: stable and blood pressure returned to baseline Postop Assessment: no apparent nausea or vomiting Anesthetic complications: no   No notable events documented.  Last Vitals:  Vitals:   10/12/20 0812 10/12/20 0824  BP: (!) 112/34 (!) 142/42  Pulse: 68 69  Resp: 18 16  Temp: 36.6 C   SpO2: 100% 100%    Last Pain:  Vitals:   10/12/20 0824  TempSrc:   PainSc: 0-No pain                 Devyne Hauger DANIEL

## 2020-10-12 NOTE — Anesthesia Procedure Notes (Signed)
Procedure Name: MAC Date/Time: 10/12/2020 7:52 AM Performed by: Mariea Clonts, CRNA Pre-anesthesia Checklist: Patient identified, Emergency Drugs available, Suction available, Patient being monitored and Timeout performed Patient Re-evaluated:Patient Re-evaluated prior to induction Oxygen Delivery Method: Nasal cannula and Simple face mask

## 2020-10-12 NOTE — Transfer of Care (Signed)
Immediate Anesthesia Transfer of Care Note  Patient: Shannon [name]  Procedure(s) Performed: ESOPHAGOGASTRODUODENOSCOPY (EGD) WITH PROPOFOL  Patient Location: PACU  Anesthesia Type:MAC  Level of Consciousness: awake, alert  and oriented  Airway & Oxygen Therapy: Patient Spontanous Breathing  Post-op Assessment: Report given to RN and Post -op Vital signs reviewed and stable  Post vital signs: Reviewed and stable  Last Vitals:  Vitals Value Taken Time  BP    Temp    Pulse    Resp    SpO2      Last Pain:  Vitals:   10/12/20 0710  TempSrc: Temporal  PainSc: 0-No pain         Complications: No notable events documented.

## 2020-10-12 NOTE — Plan of Care (Signed)
  Problem: Education: Goal: Knowledge of General Education information will improve Description Including pain rating scale, medication(s)/side effects and non-pharmacologic comfort measures Outcome: Progressing   

## 2020-10-12 NOTE — Progress Notes (Signed)
DC canceled-some unformed dark stool and PEG tube is out patient reassessed at bedside personally and looking fair no distress not tachycardic not hypotensive and seems unchanged from prior  Per description from RN stool was unformed mixed with some solids and this may be dark blood but we will keep overnight and check hemoglobin again at 10 PM  I spoke to IR Dr. Earleen Newport who will arrange for bedside reeval of feeding tube and if all is stable probably can discharge home a.m.--if hemoglobin drops GI will need to be reconsulted  Verneita Griffes, MD [facility] 6:21 PM

## 2020-10-12 NOTE — Discharge Summary (Signed)
into the gastric lumen. FLUOROSCOPY TIME:  Fluoroscopy Time: 3 minutes 36 seconds (22 mGy). COMPLICATIONS: None immediate. PROCEDURE: Informed written consent was obtained from the patient after a thorough discussion of the procedural risks, benefits and alternatives. All questions were addressed. Maximal Sterile Barrier Technique was utilized including caps, mask, sterile gowns, sterile gloves, sterile drape, hand hygiene and skin antiseptic. A timeout was performed prior to the initiation of the procedure. The retention balloon was completely deflated. The tube was removed. An angled catheter and Glidewire were advanced through the ostomy, through the stomach, duodenum and into the proximal jejunum. A new Infit 41 French gastrojejunostomy tube was then advanced over the wire and into the proximal small bowel. The retention balloon was inflated with 8 mL sterile  saline and pulled snug against the anterior abdominal wall. Contrast material was injected through the tube confirming its location in the proximal small bowel. The external bumper was fixed in place. IMPRESSION: Successful exchange for a new 22 French gastro jejunostomy tube. Electronically Signed   By: Jacqulynn Cadet M.D.   On: 10/05/2020 14:26   DG Foot Complete Left  Result Date: 09/23/2020 Please see detailed radiograph report in office note.  DG Foot Complete Right  Result Date: 09/23/2020 Please see detailed radiograph report in office note.  VAS [place] LOWER EXTREMITY ARTERIAL DUPLEX  Result Date: 10/01/2020 LOWER EXTREMITY ARTERIAL DUPLEX STUDY Patient Name:  Shannon [name]  Date of Exam:   10/01/2020 Medical Rec #: [id number]      Accession #:    [id number] Date of Birth: May 07, 1923      Patient Gender: F Patient Age:   87Y Exam Location:  [facility] Procedure:      VAS [place] LOWER EXTREMITY ARTERIAL DUPLEX Referring Phys: [id number] [name] HAWKEN --------------------------------------------------------------------------------  Indications: Pre op left transmetatarsal amputation. High Risk Factors: Hypertension.  Current ABI: 09/10/20 Left: Noncompressible with monophasic waveforms Comparison Study: none Performing Technologist: June Leap RDMS, RVT  Examination Guidelines: A complete evaluation includes B-mode imaging, spectral Doppler, color Doppler, and power Doppler as needed of all accessible portions of each vessel. Bilateral testing is considered an integral part of a complete examination. Limited examinations for reoccurring indications may be performed as noted.   +-----------+--------+-----+--------+----------+--------+ LEFT       PSV cm/sRatioStenosisWaveform  Comments +-----------+--------+-----+--------+----------+--------+ CFA Distal 151                  biphasic           +-----------+--------+-----+--------+----------+--------+ DFA        98                    biphasic           +-----------+--------+-----+--------+----------+--------+ SFA Prox   101                  biphasic           +-----------+--------+-----+--------+----------+--------+ SFA Mid    94                   biphasic           +-----------+--------+-----+--------+----------+--------+ SFA Distal 125                  biphasic           +-----------+--------+-----+--------+----------+--------+ POP Prox   111                  biphasic           +-----------+--------+-----+--------+----------+--------+  into the gastric lumen. FLUOROSCOPY TIME:  Fluoroscopy Time: 3 minutes 36 seconds (22 mGy). COMPLICATIONS: None immediate. PROCEDURE: Informed written consent was obtained from the patient after a thorough discussion of the procedural risks, benefits and alternatives. All questions were addressed. Maximal Sterile Barrier Technique was utilized including caps, mask, sterile gowns, sterile gloves, sterile drape, hand hygiene and skin antiseptic. A timeout was performed prior to the initiation of the procedure. The retention balloon was completely deflated. The tube was removed. An angled catheter and Glidewire were advanced through the ostomy, through the stomach, duodenum and into the proximal jejunum. A new Infit 41 French gastrojejunostomy tube was then advanced over the wire and into the proximal small bowel. The retention balloon was inflated with 8 mL sterile  saline and pulled snug against the anterior abdominal wall. Contrast material was injected through the tube confirming its location in the proximal small bowel. The external bumper was fixed in place. IMPRESSION: Successful exchange for a new 22 French gastro jejunostomy tube. Electronically Signed   By: Jacqulynn Cadet M.D.   On: 10/05/2020 14:26   DG Foot Complete Left  Result Date: 09/23/2020 Please see detailed radiograph report in office note.  DG Foot Complete Right  Result Date: 09/23/2020 Please see detailed radiograph report in office note.  VAS [place] LOWER EXTREMITY ARTERIAL DUPLEX  Result Date: 10/01/2020 LOWER EXTREMITY ARTERIAL DUPLEX STUDY Patient Name:  Shannon [name]  Date of Exam:   10/01/2020 Medical Rec #: [id number]      Accession #:    [id number] Date of Birth: May 07, 1923      Patient Gender: F Patient Age:   87Y Exam Location:  [facility] Procedure:      VAS [place] LOWER EXTREMITY ARTERIAL DUPLEX Referring Phys: [id number] [name] HAWKEN --------------------------------------------------------------------------------  Indications: Pre op left transmetatarsal amputation. High Risk Factors: Hypertension.  Current ABI: 09/10/20 Left: Noncompressible with monophasic waveforms Comparison Study: none Performing Technologist: June Leap RDMS, RVT  Examination Guidelines: A complete evaluation includes B-mode imaging, spectral Doppler, color Doppler, and power Doppler as needed of all accessible portions of each vessel. Bilateral testing is considered an integral part of a complete examination. Limited examinations for reoccurring indications may be performed as noted.   +-----------+--------+-----+--------+----------+--------+ LEFT       PSV cm/sRatioStenosisWaveform  Comments +-----------+--------+-----+--------+----------+--------+ CFA Distal 151                  biphasic           +-----------+--------+-----+--------+----------+--------+ DFA        98                    biphasic           +-----------+--------+-----+--------+----------+--------+ SFA Prox   101                  biphasic           +-----------+--------+-----+--------+----------+--------+ SFA Mid    94                   biphasic           +-----------+--------+-----+--------+----------+--------+ SFA Distal 125                  biphasic           +-----------+--------+-----+--------+----------+--------+ POP Prox   111                  biphasic           +-----------+--------+-----+--------+----------+--------+  into the gastric lumen. FLUOROSCOPY TIME:  Fluoroscopy Time: 3 minutes 36 seconds (22 mGy). COMPLICATIONS: None immediate. PROCEDURE: Informed written consent was obtained from the patient after a thorough discussion of the procedural risks, benefits and alternatives. All questions were addressed. Maximal Sterile Barrier Technique was utilized including caps, mask, sterile gowns, sterile gloves, sterile drape, hand hygiene and skin antiseptic. A timeout was performed prior to the initiation of the procedure. The retention balloon was completely deflated. The tube was removed. An angled catheter and Glidewire were advanced through the ostomy, through the stomach, duodenum and into the proximal jejunum. A new Infit 41 French gastrojejunostomy tube was then advanced over the wire and into the proximal small bowel. The retention balloon was inflated with 8 mL sterile  saline and pulled snug against the anterior abdominal wall. Contrast material was injected through the tube confirming its location in the proximal small bowel. The external bumper was fixed in place. IMPRESSION: Successful exchange for a new 22 French gastro jejunostomy tube. Electronically Signed   By: Jacqulynn Cadet M.D.   On: 10/05/2020 14:26   DG Foot Complete Left  Result Date: 09/23/2020 Please see detailed radiograph report in office note.  DG Foot Complete Right  Result Date: 09/23/2020 Please see detailed radiograph report in office note.  VAS [place] LOWER EXTREMITY ARTERIAL DUPLEX  Result Date: 10/01/2020 LOWER EXTREMITY ARTERIAL DUPLEX STUDY Patient Name:  Shannon [name]  Date of Exam:   10/01/2020 Medical Rec #: [id number]      Accession #:    [id number] Date of Birth: May 07, 1923      Patient Gender: F Patient Age:   87Y Exam Location:  [facility] Procedure:      VAS [place] LOWER EXTREMITY ARTERIAL DUPLEX Referring Phys: [id number] [name] HAWKEN --------------------------------------------------------------------------------  Indications: Pre op left transmetatarsal amputation. High Risk Factors: Hypertension.  Current ABI: 09/10/20 Left: Noncompressible with monophasic waveforms Comparison Study: none Performing Technologist: June Leap RDMS, RVT  Examination Guidelines: A complete evaluation includes B-mode imaging, spectral Doppler, color Doppler, and power Doppler as needed of all accessible portions of each vessel. Bilateral testing is considered an integral part of a complete examination. Limited examinations for reoccurring indications may be performed as noted.   +-----------+--------+-----+--------+----------+--------+ LEFT       PSV cm/sRatioStenosisWaveform  Comments +-----------+--------+-----+--------+----------+--------+ CFA Distal 151                  biphasic           +-----------+--------+-----+--------+----------+--------+ DFA        98                    biphasic           +-----------+--------+-----+--------+----------+--------+ SFA Prox   101                  biphasic           +-----------+--------+-----+--------+----------+--------+ SFA Mid    94                   biphasic           +-----------+--------+-----+--------+----------+--------+ SFA Distal 125                  biphasic           +-----------+--------+-----+--------+----------+--------+ POP Prox   111                  biphasic           +-----------+--------+-----+--------+----------+--------+  into the gastric lumen. FLUOROSCOPY TIME:  Fluoroscopy Time: 3 minutes 36 seconds (22 mGy). COMPLICATIONS: None immediate. PROCEDURE: Informed written consent was obtained from the patient after a thorough discussion of the procedural risks, benefits and alternatives. All questions were addressed. Maximal Sterile Barrier Technique was utilized including caps, mask, sterile gowns, sterile gloves, sterile drape, hand hygiene and skin antiseptic. A timeout was performed prior to the initiation of the procedure. The retention balloon was completely deflated. The tube was removed. An angled catheter and Glidewire were advanced through the ostomy, through the stomach, duodenum and into the proximal jejunum. A new Infit 41 French gastrojejunostomy tube was then advanced over the wire and into the proximal small bowel. The retention balloon was inflated with 8 mL sterile  saline and pulled snug against the anterior abdominal wall. Contrast material was injected through the tube confirming its location in the proximal small bowel. The external bumper was fixed in place. IMPRESSION: Successful exchange for a new 22 French gastro jejunostomy tube. Electronically Signed   By: Jacqulynn Cadet M.D.   On: 10/05/2020 14:26   DG Foot Complete Left  Result Date: 09/23/2020 Please see detailed radiograph report in office note.  DG Foot Complete Right  Result Date: 09/23/2020 Please see detailed radiograph report in office note.  VAS [place] LOWER EXTREMITY ARTERIAL DUPLEX  Result Date: 10/01/2020 LOWER EXTREMITY ARTERIAL DUPLEX STUDY Patient Name:  Shannon [name]  Date of Exam:   10/01/2020 Medical Rec #: [id number]      Accession #:    [id number] Date of Birth: May 07, 1923      Patient Gender: F Patient Age:   87Y Exam Location:  [facility] Procedure:      VAS [place] LOWER EXTREMITY ARTERIAL DUPLEX Referring Phys: [id number] [name] HAWKEN --------------------------------------------------------------------------------  Indications: Pre op left transmetatarsal amputation. High Risk Factors: Hypertension.  Current ABI: 09/10/20 Left: Noncompressible with monophasic waveforms Comparison Study: none Performing Technologist: June Leap RDMS, RVT  Examination Guidelines: A complete evaluation includes B-mode imaging, spectral Doppler, color Doppler, and power Doppler as needed of all accessible portions of each vessel. Bilateral testing is considered an integral part of a complete examination. Limited examinations for reoccurring indications may be performed as noted.   +-----------+--------+-----+--------+----------+--------+ LEFT       PSV cm/sRatioStenosisWaveform  Comments +-----------+--------+-----+--------+----------+--------+ CFA Distal 151                  biphasic           +-----------+--------+-----+--------+----------+--------+ DFA        98                    biphasic           +-----------+--------+-----+--------+----------+--------+ SFA Prox   101                  biphasic           +-----------+--------+-----+--------+----------+--------+ SFA Mid    94                   biphasic           +-----------+--------+-----+--------+----------+--------+ SFA Distal 125                  biphasic           +-----------+--------+-----+--------+----------+--------+ POP Prox   111                  biphasic           +-----------+--------+-----+--------+----------+--------+  Physician Discharge Summary  CADE OLBERDING EHU:[id number] DOB: Apr 26, 1923 DOA: 10/10/2020  PCP: [name], NP  Admit date: 10/10/2020 Discharge date: 10/12/2020  Time spent: 45 minutes  Recommendations for Outpatient Follow-up:  Coumadin discontinued this admission-Long discussion with family regarding risk-benefit E TC--we will need to continue these discussions in the outpatient setting New med: Protonix twice daily, Carafate 3 times daily Recommend CBC Chem-12 within 1 week from discharge Needs to follow-up with vascular surgeon for rescheduled procedure 10/20/2020---I have CCed them May need to discontinue insulin given A1c is below 7 in the very elderly--please discuss with patient as an outpatient as per PCP   Discharge Diagnoses:  MAIN problem for hospitalization   acute upper abdominal GI bleed  Please see below for itemized issues addressed in [place]- refer to other progress notes for clarity if needed  Discharge Condition:  Improved  Diet recommendation:  Prior  Filed Weights   10/10/20 2222  Weight: 54.4 kg    History of present illness:  87 year old DNR black female Pyloric stenosis + G-tube with prior dislodgments in January 2022 status post replacement CKD III A Atrial fibrillation CHADS2 score >3/Coumadin DM TY 2 A1c 6.2 09/2020 HFpEF NYHA class IV EF 65-70% 05/19/2019 PAD with right foot ulcerations status post transplant R foot 11/21/2019   Evaluated [facility] 10/09/2018 to 10/09/2020 as per HPI with PEG site bleeding which was cauterized-apparently had J-tube exchange 10/05/2020 in addition by IR  -subsequently found to have bloody bowel movements and came back to the emergency room    hemoglobin dropped from 9 range to 6 range Rx vitamin K Rx 2 units PRBC in ED---awaiting GI consult  Hospital Course:  Acute upper GI bleed Secondary to elevated INR in the 7 range-reversed with vitamin K in the ED GI evaluated and scoped as below Hemodynamically more  stable at this time and able to resume liquid diet as per prior stabilized this admission for discharge Protonix twice daily ongoing, Carafate Rx given for at least 2 to 3 weeks Severe pyloric stenosis + GHS with dislodgment in 04/22/2020 and again subsequently 10/09/2020 with exchange of J-tube 10/05/2020 Requires interval supervision per PCP/IR Tube seems to be functioning and is flushing-continue nighttime feeds 1 can and regular flushing as per interval Atrial fibrillation CHADS2 score >4 on Coumadin Prior DVT while previously on NOAC/DOAC resulting in use of Coumadin Long discussion at the bedside with patient/daughter-discussed risks and benefits of Coumadin in terms of yearly risk reduction CVA-also discussed risk of catastrophic bleeding Patient wants to defer final decision about resumption of Coumadin into the outpatient setting-Coumadin held on discharge HFpEF NYHA class IV EF 65-70% 05/2019 Relatively euvolemic this admission-resumed Imdur 60, amlodipine 5, Lasix 20 as needed/20 daily as per home meds on discharge Consider discontinuation hydralazine-3 times daily medication-reduce risk of noncompliance/polypharmacy in the elderly DM TY 2 A1c 6.2 Unclear mortality benefit-high risk hypoglycemia-outpatient consideration of discontinuation insulin-resume Lantus for now at bedtime on discharge     Procedures:  impression:               - LA Grade C reflux esophagitis with bleeding.                           - Benign-appearing esophageal stenosis. Dilated                           with scope - not dilated further  Physician Discharge Summary  CADE OLBERDING EHU:[id number] DOB: Apr 26, 1923 DOA: 10/10/2020  PCP: [name], NP  Admit date: 10/10/2020 Discharge date: 10/12/2020  Time spent: 45 minutes  Recommendations for Outpatient Follow-up:  Coumadin discontinued this admission-Long discussion with family regarding risk-benefit E TC--we will need to continue these discussions in the outpatient setting New med: Protonix twice daily, Carafate 3 times daily Recommend CBC Chem-12 within 1 week from discharge Needs to follow-up with vascular surgeon for rescheduled procedure 10/20/2020---I have CCed them May need to discontinue insulin given A1c is below 7 in the very elderly--please discuss with patient as an outpatient as per PCP   Discharge Diagnoses:  MAIN problem for hospitalization   acute upper abdominal GI bleed  Please see below for itemized issues addressed in [place]- refer to other progress notes for clarity if needed  Discharge Condition:  Improved  Diet recommendation:  Prior  Filed Weights   10/10/20 2222  Weight: 54.4 kg    History of present illness:  87 year old DNR black female Pyloric stenosis + G-tube with prior dislodgments in January 2022 status post replacement CKD III A Atrial fibrillation CHADS2 score >3/Coumadin DM TY 2 A1c 6.2 09/2020 HFpEF NYHA class IV EF 65-70% 05/19/2019 PAD with right foot ulcerations status post transplant R foot 11/21/2019   Evaluated [facility] 10/09/2018 to 10/09/2020 as per HPI with PEG site bleeding which was cauterized-apparently had J-tube exchange 10/05/2020 in addition by IR  -subsequently found to have bloody bowel movements and came back to the emergency room    hemoglobin dropped from 9 range to 6 range Rx vitamin K Rx 2 units PRBC in ED---awaiting GI consult  Hospital Course:  Acute upper GI bleed Secondary to elevated INR in the 7 range-reversed with vitamin K in the ED GI evaluated and scoped as below Hemodynamically more  stable at this time and able to resume liquid diet as per prior stabilized this admission for discharge Protonix twice daily ongoing, Carafate Rx given for at least 2 to 3 weeks Severe pyloric stenosis + GHS with dislodgment in 04/22/2020 and again subsequently 10/09/2020 with exchange of J-tube 10/05/2020 Requires interval supervision per PCP/IR Tube seems to be functioning and is flushing-continue nighttime feeds 1 can and regular flushing as per interval Atrial fibrillation CHADS2 score >4 on Coumadin Prior DVT while previously on NOAC/DOAC resulting in use of Coumadin Long discussion at the bedside with patient/daughter-discussed risks and benefits of Coumadin in terms of yearly risk reduction CVA-also discussed risk of catastrophic bleeding Patient wants to defer final decision about resumption of Coumadin into the outpatient setting-Coumadin held on discharge HFpEF NYHA class IV EF 65-70% 05/2019 Relatively euvolemic this admission-resumed Imdur 60, amlodipine 5, Lasix 20 as needed/20 daily as per home meds on discharge Consider discontinuation hydralazine-3 times daily medication-reduce risk of noncompliance/polypharmacy in the elderly DM TY 2 A1c 6.2 Unclear mortality benefit-high risk hypoglycemia-outpatient consideration of discontinuation insulin-resume Lantus for now at bedtime on discharge     Procedures:  impression:               - LA Grade C reflux esophagitis with bleeding.                           - Benign-appearing esophageal stenosis. Dilated                           with scope - not dilated further  Physician Discharge Summary  CADE OLBERDING EHU:[id number] DOB: Apr 26, 1923 DOA: 10/10/2020  PCP: [name], NP  Admit date: 10/10/2020 Discharge date: 10/12/2020  Time spent: 45 minutes  Recommendations for Outpatient Follow-up:  Coumadin discontinued this admission-Long discussion with family regarding risk-benefit E TC--we will need to continue these discussions in the outpatient setting New med: Protonix twice daily, Carafate 3 times daily Recommend CBC Chem-12 within 1 week from discharge Needs to follow-up with vascular surgeon for rescheduled procedure 10/20/2020---I have CCed them May need to discontinue insulin given A1c is below 7 in the very elderly--please discuss with patient as an outpatient as per PCP   Discharge Diagnoses:  MAIN problem for hospitalization   acute upper abdominal GI bleed  Please see below for itemized issues addressed in [place]- refer to other progress notes for clarity if needed  Discharge Condition:  Improved  Diet recommendation:  Prior  Filed Weights   10/10/20 2222  Weight: 54.4 kg    History of present illness:  87 year old DNR black female Pyloric stenosis + G-tube with prior dislodgments in January 2022 status post replacement CKD III A Atrial fibrillation CHADS2 score >3/Coumadin DM TY 2 A1c 6.2 09/2020 HFpEF NYHA class IV EF 65-70% 05/19/2019 PAD with right foot ulcerations status post transplant R foot 11/21/2019   Evaluated [facility] 10/09/2018 to 10/09/2020 as per HPI with PEG site bleeding which was cauterized-apparently had J-tube exchange 10/05/2020 in addition by IR  -subsequently found to have bloody bowel movements and came back to the emergency room    hemoglobin dropped from 9 range to 6 range Rx vitamin K Rx 2 units PRBC in ED---awaiting GI consult  Hospital Course:  Acute upper GI bleed Secondary to elevated INR in the 7 range-reversed with vitamin K in the ED GI evaluated and scoped as below Hemodynamically more  stable at this time and able to resume liquid diet as per prior stabilized this admission for discharge Protonix twice daily ongoing, Carafate Rx given for at least 2 to 3 weeks Severe pyloric stenosis + GHS with dislodgment in 04/22/2020 and again subsequently 10/09/2020 with exchange of J-tube 10/05/2020 Requires interval supervision per PCP/IR Tube seems to be functioning and is flushing-continue nighttime feeds 1 can and regular flushing as per interval Atrial fibrillation CHADS2 score >4 on Coumadin Prior DVT while previously on NOAC/DOAC resulting in use of Coumadin Long discussion at the bedside with patient/daughter-discussed risks and benefits of Coumadin in terms of yearly risk reduction CVA-also discussed risk of catastrophic bleeding Patient wants to defer final decision about resumption of Coumadin into the outpatient setting-Coumadin held on discharge HFpEF NYHA class IV EF 65-70% 05/2019 Relatively euvolemic this admission-resumed Imdur 60, amlodipine 5, Lasix 20 as needed/20 daily as per home meds on discharge Consider discontinuation hydralazine-3 times daily medication-reduce risk of noncompliance/polypharmacy in the elderly DM TY 2 A1c 6.2 Unclear mortality benefit-high risk hypoglycemia-outpatient consideration of discontinuation insulin-resume Lantus for now at bedtime on discharge     Procedures:  impression:               - LA Grade C reflux esophagitis with bleeding.                           - Benign-appearing esophageal stenosis. Dilated                           with scope - not dilated further

## 2020-10-12 NOTE — Interval H&P Note (Signed)
History and Physical Interval Note:  10/12/2020 7:44 AM  Shannon [name]  has presented today for surgery, with the diagnosis of upper GI bleed.  The various methods of treatment have been discussed with the patient and family. After consideration of risks, benefits and other options for treatment, the patient has consented to  Procedure(s): ESOPHAGOGASTRODUODENOSCOPY (EGD) WITH PROPOFOL (N/A) as a surgical intervention.  The patient's history has been reviewed, patient examined, no change in status, stable for surgery.  I have reviewed the patient's chart and labs.  Questions were answered to the patient's satisfaction.     Silvano Rusk

## 2020-10-13 ENCOUNTER — Encounter: Payer: Medicare Other | Admitting: Podiatry

## 2020-10-13 LAB — CBC WITH DIFFERENTIAL/PLATELET
Abs Immature Granulocytes: 0.02 10*3/uL (ref 0.00–0.07)
Basophils Absolute: 0 10*3/uL (ref 0.0–0.1)
Basophils Relative: 0 %
Eosinophils Absolute: 0.3 10*3/uL (ref 0.0–0.5)
Eosinophils Relative: 5 %
HCT: 28.5 % — ABNORMAL LOW (ref 36.0–46.0)
Hemoglobin: 9.5 g/dL — ABNORMAL LOW (ref 12.0–15.0)
Immature Granulocytes: 0 %
Lymphocytes Relative: 17 %
Lymphs Abs: 0.8 10*3/uL (ref 0.7–4.0)
MCH: 28.2 pg (ref 26.0–34.0)
MCHC: 33.3 g/dL (ref 30.0–36.0)
MCV: 84.6 fL (ref 80.0–100.0)
Monocytes Absolute: 0.6 10*3/uL (ref 0.1–1.0)
Monocytes Relative: 13 %
Neutro Abs: 3.1 10*3/uL (ref 1.7–7.7)
Neutrophils Relative %: 65 %
Platelets: 204 10*3/uL (ref 150–400)
RBC: 3.37 MIL/uL — ABNORMAL LOW (ref 3.87–5.11)
RDW: 20.4 % — ABNORMAL HIGH (ref 11.5–15.5)
WBC: 4.8 10*3/uL (ref 4.0–10.5)
nRBC: 0 % (ref 0.0–0.2)

## 2020-10-13 LAB — COMPREHENSIVE METABOLIC PANEL
ALT: 15 U/L (ref 0–44)
AST: 22 U/L (ref 15–41)
Albumin: 2.5 g/dL — ABNORMAL LOW (ref 3.5–5.0)
Alkaline Phosphatase: 59 U/L (ref 38–126)
Anion gap: 4 — ABNORMAL LOW (ref 5–15)
BUN: 35 mg/dL — ABNORMAL HIGH (ref 8–23)
CO2: 26 mmol/L (ref 22–32)
Calcium: 8.7 mg/dL — ABNORMAL LOW (ref 8.9–10.3)
Chloride: 103 mmol/L (ref 98–111)
Creatinine, Ser: 1.27 mg/dL — ABNORMAL HIGH (ref 0.44–1.00)
GFR, Estimated: 39 mL/min — ABNORMAL LOW (ref >60)
Glucose, Bld: 69 mg/dL — ABNORMAL LOW (ref 70–99)
Potassium: 3.6 mmol/L (ref 3.5–5.1)
Sodium: 133 mmol/L — ABNORMAL LOW (ref 135–145)
Total Bilirubin: 1.2 mg/dL (ref 0.3–1.2)
Total Protein: 6.1 g/dL — ABNORMAL LOW (ref 6.5–8.1)

## 2020-10-13 LAB — PROTIME-INR
INR: 1.2 (ref 0.8–1.2)
Prothrombin Time: 15.5 seconds — ABNORMAL HIGH (ref 11.4–15.2)

## 2020-10-13 LAB — GLUCOSE, CAPILLARY
Glucose-Capillary: 78 mg/dL (ref 70–99)
Glucose-Capillary: 81 mg/dL (ref 70–99)

## 2020-10-13 NOTE — Progress Notes (Signed)
Interventional Radiology Brief Note:  IR aware of Shannon [name] from prior [place] tube placement and recent exchanges.  Notified overnight that patient's GJ tube was inadvertently removed.  The tube was replaced in the tract, however her J-portion will need to be replaced under fluoro.  [facility] does not have 22Fr GJ in stock.  Have made arrangements with [facility] to send tube this afternoon.  Will plan to replace her [place] tomorrow as schedule allows.   Patient, RN, and MD made aware.  Brynda Greathouse, MS RD PA-C 3:00 PM

## 2020-10-13 NOTE — Consult Note (Signed)
[facility] CM Inpatient Consult   10/13/2020 4     Shannon [name] [birth year] [id number]  [facility] [[name]]  Accountable Care Organization [ACO] Patient: Medicare CMS DCE  Extreme high risk for unplanned readmission score  Primary Care Provider:  Sharon Seller, NP with [facility]   Patient is currently active with [facility] [[name]] Care Management for chronic disease management services.  Patient has been engaged by a [facility].  Our community based plan of care has focused on disease management and community resource support.   Reviewed EMR for progress and MD notes, had planned surgery noted however, came to the ED on 10/10/20 and admitted for GI bleeding.  Came by to speak with patient and she is alert in bed talking on the phone. Met with patient at the bedside and she states she is disappointed in not get her leg straighten out and her GI tube is out.  She is awaiting to see if she is still going to have the procedure now rescheduled for next week.  Plan: Follow for progress and disposition.  Update [name] RN CC of any additional needs or changes in transition of care follow up.   Of note, [facility] Care Management services does not replace or interfere with any services that are needed or arranged by inpatient [facility] care management team.  For additional questions or referrals please contact:  Charlesetta Shanks, RN BSN CCM [facility]  [phone] business mobile phone Toll free office [phone]  Fax number: [phone] [email or web address]

## 2020-10-13 NOTE — Progress Notes (Signed)
Inpatient Diabetes Program Recommendations  AACE/ADA: New Consensus Statement on Inpatient Glycemic Control (2015)  Target Ranges:  Prepandial:   less than 140 mg/dL      Peak postprandial:   less than 180 mg/dL (1-2 hours)      Critically ill patients:  140 - 180 mg/dL   Lab Results  Component Value Date   GLUCAP 88 10/12/2020   HGBA1C 6.2 (H) 09/23/2020    Review of Glycemic Control Results for HOKULANI, ROGEL (MRN [id number]) as of 10/13/2020 12:48  Ref. Range 10/11/2020 21:22 10/12/2020 07:35 10/12/2020 08:16 10/12/2020 22:23  Glucose-Capillary Latest Ref Range: 70 - 99 mg/dL 183 (H) 71 171 (H) 88   Diabetes history: DM 2 Outpatient Diabetes medications:  Lantus 8 units q HS, Novolog 2-6 units prn Current orders for Inpatient glycemic control:  Lantus 8 units daily  Inpatient Diabetes Program Recommendations:    Note low blood sugar per lab this AM.  May consider holding Lantus for now and checking blood sugars q 6 hours while in the hospital.  Last A1C was 6.2% indicating tight control of CBG's.  May want more liberal glycemic goals due to age and to prevent hypoglycemia.   Thanks,  Adah Perl, RN, BC-ADM Inpatient Diabetes Coordinator Pager [phone]  (8a-5p)

## 2020-10-13 NOTE — Progress Notes (Signed)
Physician Discharge Summary  Shannon [name] ZOX:[id number] DOB: 01-Oct-1923 DOA: 10/10/2020  PCP: [name], NP  Admit date: 10/10/2020 Discharge date: 10/13/2020  Patient medically stable for discharge - unfortunately her GJ tube became dislodged prior to admission - working with IR who is attempting to obtain specialized tube for replacement. Once definitive route for medication/nutrition is obtained will discharge back home with family.  Time spent: 25 minutes  Recommendations for Outpatient Follow-up:  Coumadin discontinued this admission-Long discussion with family regarding risk-benefit E TC--we will need to continue these discussions in the outpatient setting New med: Protonix twice daily, Carafate 3 times daily Recommend CBC Chem-12 within 1 week from discharge Needs to follow-up with vascular surgeon for rescheduled procedure 10/20/2020---I have CCed them May need to discontinue insulin given A1c is below 7 in the very elderly--please discuss with patient as an outpatient as per PCP   Discharge Diagnoses:  MAIN problem for hospitalization   Acute upper abdominal GI bleed:  Discharge Condition:  Improved  Diet recommendation:  Prior  Filed Weights   10/10/20 2222 10/12/20 2102  Weight: 54.4 kg 54.4 kg    History of present illness:  87 year old DNR black female; Pyloric stenosis + G-tube with prior dislodgments in January 2022 status post replacement CKD III A, Atrial fibrillation CHADS2 score >3/Coumadin, DM TY 2 A1c 6.2 09/2020, HFpEF NYHA class IV EF 65-70% 05/19/2019, PAD with right foot ulcerations status post transplant R foot 11/21/2019, Evaluated [facility] ED 10/09/2018 to 10/09/2020 as per HPI with PEG site bleeding which was cauterized-apparently had J-tube exchange 10/05/2020 in addition by IR - subsequently found to have bloody bowel movements and came back to the emergency room. Hemoglobin dropped from 9 range to 6 range Rx vitamin K Rx 2 units PRBC in ED - GI  consulted  Hospital Course:  Acute upper GI bleed Secondary to elevated INR in the 7 range-reversed with vitamin K in the ED GI evaluated and scoped as below Hemodynamically more stable at this time and able to resume liquid diet as per prior stabilized this admission for discharge Protonix twice daily ongoing, Carafate Rx given for at least 2 to 3 weeks Severe pyloric stenosis + GHS with dislodgment in 04/22/2020 and again subsequently 10/09/2020 with exchange of J-tube 10/05/2020 Requires interval supervision per PCP/IR Tube seems to be functioning and is flushing-continue nighttime feeds 1 can and regular flushing as per interval Atrial fibrillation CHADS2 score >4 on Coumadin Prior DVT while previously on NOAC/DOAC resulting in use of Coumadin Long discussion at the bedside with patient/daughter-discussed risks and benefits of Coumadin in terms of yearly risk reduction CVA-also discussed risk of catastrophic bleeding Patient wants to defer final decision about resumption of Coumadin into the outpatient setting-Coumadin held on discharge HFpEF NYHA class IV EF 65-70% 05/2019 Relatively euvolemic this admission-resumed Imdur 60, amlodipine 5, Lasix 20 as needed/20 daily as per home meds on discharge Consider discontinuation hydralazine-3 times daily medication-reduce risk of noncompliance/polypharmacy in the elderly DM TY 2 A1c 6.2 Unclear mortality benefit-high risk hypoglycemia-outpatient consideration of discontinuation insulin-resume Lantus for now at bedtime on discharge     Procedures:  impression:               - LA Grade C reflux esophagitis with bleeding.                           - Benign-appearing esophageal stenosis. Dilated  Physician Discharge Summary  Shannon [name] ZOX:[id number] DOB: 01-Oct-1923 DOA: 10/10/2020  PCP: [name], NP  Admit date: 10/10/2020 Discharge date: 10/13/2020  Patient medically stable for discharge - unfortunately her GJ tube became dislodged prior to admission - working with IR who is attempting to obtain specialized tube for replacement. Once definitive route for medication/nutrition is obtained will discharge back home with family.  Time spent: 25 minutes  Recommendations for Outpatient Follow-up:  Coumadin discontinued this admission-Long discussion with family regarding risk-benefit E TC--we will need to continue these discussions in the outpatient setting New med: Protonix twice daily, Carafate 3 times daily Recommend CBC Chem-12 within 1 week from discharge Needs to follow-up with vascular surgeon for rescheduled procedure 10/20/2020---I have CCed them May need to discontinue insulin given A1c is below 7 in the very elderly--please discuss with patient as an outpatient as per PCP   Discharge Diagnoses:  MAIN problem for hospitalization   Acute upper abdominal GI bleed:  Discharge Condition:  Improved  Diet recommendation:  Prior  Filed Weights   10/10/20 2222 10/12/20 2102  Weight: 54.4 kg 54.4 kg    History of present illness:  87 year old DNR black female; Pyloric stenosis + G-tube with prior dislodgments in January 2022 status post replacement CKD III A, Atrial fibrillation CHADS2 score >3/Coumadin, DM TY 2 A1c 6.2 09/2020, HFpEF NYHA class IV EF 65-70% 05/19/2019, PAD with right foot ulcerations status post transplant R foot 11/21/2019, Evaluated [facility] ED 10/09/2018 to 10/09/2020 as per HPI with PEG site bleeding which was cauterized-apparently had J-tube exchange 10/05/2020 in addition by IR - subsequently found to have bloody bowel movements and came back to the emergency room. Hemoglobin dropped from 9 range to 6 range Rx vitamin K Rx 2 units PRBC in ED - GI  consulted  Hospital Course:  Acute upper GI bleed Secondary to elevated INR in the 7 range-reversed with vitamin K in the ED GI evaluated and scoped as below Hemodynamically more stable at this time and able to resume liquid diet as per prior stabilized this admission for discharge Protonix twice daily ongoing, Carafate Rx given for at least 2 to 3 weeks Severe pyloric stenosis + GHS with dislodgment in 04/22/2020 and again subsequently 10/09/2020 with exchange of J-tube 10/05/2020 Requires interval supervision per PCP/IR Tube seems to be functioning and is flushing-continue nighttime feeds 1 can and regular flushing as per interval Atrial fibrillation CHADS2 score >4 on Coumadin Prior DVT while previously on NOAC/DOAC resulting in use of Coumadin Long discussion at the bedside with patient/daughter-discussed risks and benefits of Coumadin in terms of yearly risk reduction CVA-also discussed risk of catastrophic bleeding Patient wants to defer final decision about resumption of Coumadin into the outpatient setting-Coumadin held on discharge HFpEF NYHA class IV EF 65-70% 05/2019 Relatively euvolemic this admission-resumed Imdur 60, amlodipine 5, Lasix 20 as needed/20 daily as per home meds on discharge Consider discontinuation hydralazine-3 times daily medication-reduce risk of noncompliance/polypharmacy in the elderly DM TY 2 A1c 6.2 Unclear mortality benefit-high risk hypoglycemia-outpatient consideration of discontinuation insulin-resume Lantus for now at bedtime on discharge     Procedures:  impression:               - LA Grade C reflux esophagitis with bleeding.                           - Benign-appearing esophageal stenosis. Dilated  skin antiseptic. A timeout was performed prior to the initiation of the procedure. The retention balloon was completely deflated. The tube was removed. An angled catheter and Glidewire were advanced through the ostomy, through the stomach, duodenum and into the proximal jejunum. A new Infit 72 French gastrojejunostomy tube was then advanced over the wire and into the proximal small bowel. The retention balloon was inflated with 8 mL sterile saline and pulled snug against the anterior abdominal wall. Contrast material was injected through the tube confirming its  location in the proximal small bowel. The external bumper was fixed in place. IMPRESSION: Successful exchange for a new 22 French gastro jejunostomy tube. Electronically Signed   By: Jacqulynn Cadet M.D.   On: 10/05/2020 14:26   DG Foot Complete Left  Result Date: 09/23/2020 Please see detailed radiograph report in office note.  DG Foot Complete Right  Result Date: 09/23/2020 Please see detailed radiograph report in office note.  VAS [place] LOWER EXTREMITY ARTERIAL DUPLEX  Result Date: 10/01/2020 LOWER EXTREMITY ARTERIAL DUPLEX STUDY Patient Name:  Shannon [name]  Date of Exam:   10/01/2020 Medical Rec #: [id number]      Accession #:    [id number] Date of Birth: 1923-06-27      Patient Gender: F Patient Age:   89Y Exam Location:  [facility] Procedure:      VAS [place] LOWER EXTREMITY ARTERIAL DUPLEX Referring Phys: [id number] [name] HAWKEN --------------------------------------------------------------------------------  Indications: Pre op left transmetatarsal amputation. High Risk Factors: Hypertension.  Current ABI: 09/10/20 Left: Noncompressible with monophasic waveforms Comparison Study: none Performing Technologist: June Leap RDMS, RVT  Examination Guidelines: A complete evaluation includes B-mode imaging, spectral Doppler, color Doppler, and power Doppler as needed of all accessible portions of each vessel. Bilateral testing is considered an integral part of a complete examination. Limited examinations for reoccurring indications may be performed as noted.   +-----------+--------+-----+--------+----------+--------+ LEFT       PSV cm/sRatioStenosisWaveform  Comments +-----------+--------+-----+--------+----------+--------+ CFA Distal 151                  biphasic           +-----------+--------+-----+--------+----------+--------+ DFA        98                   biphasic           +-----------+--------+-----+--------+----------+--------+ SFA Prox   101                   biphasic           +-----------+--------+-----+--------+----------+--------+ SFA Mid    94                   biphasic           +-----------+--------+-----+--------+----------+--------+ SFA Distal 125                  biphasic           +-----------+--------+-----+--------+----------+--------+ POP Prox   111                  biphasic           +-----------+--------+-----+--------+----------+--------+ POP Distal 87                   monophasic         +-----------+--------+-----+--------+----------+--------+ ATA Mid                 occluded                   +-----------+--------+-----+--------+----------+--------+  skin antiseptic. A timeout was performed prior to the initiation of the procedure. The retention balloon was completely deflated. The tube was removed. An angled catheter and Glidewire were advanced through the ostomy, through the stomach, duodenum and into the proximal jejunum. A new Infit 72 French gastrojejunostomy tube was then advanced over the wire and into the proximal small bowel. The retention balloon was inflated with 8 mL sterile saline and pulled snug against the anterior abdominal wall. Contrast material was injected through the tube confirming its  location in the proximal small bowel. The external bumper was fixed in place. IMPRESSION: Successful exchange for a new 22 French gastro jejunostomy tube. Electronically Signed   By: Jacqulynn Cadet M.D.   On: 10/05/2020 14:26   DG Foot Complete Left  Result Date: 09/23/2020 Please see detailed radiograph report in office note.  DG Foot Complete Right  Result Date: 09/23/2020 Please see detailed radiograph report in office note.  VAS [place] LOWER EXTREMITY ARTERIAL DUPLEX  Result Date: 10/01/2020 LOWER EXTREMITY ARTERIAL DUPLEX STUDY Patient Name:  Shannon [name]  Date of Exam:   10/01/2020 Medical Rec #: [id number]      Accession #:    [id number] Date of Birth: 1923-06-27      Patient Gender: F Patient Age:   89Y Exam Location:  [facility] Procedure:      VAS [place] LOWER EXTREMITY ARTERIAL DUPLEX Referring Phys: [id number] [name] HAWKEN --------------------------------------------------------------------------------  Indications: Pre op left transmetatarsal amputation. High Risk Factors: Hypertension.  Current ABI: 09/10/20 Left: Noncompressible with monophasic waveforms Comparison Study: none Performing Technologist: June Leap RDMS, RVT  Examination Guidelines: A complete evaluation includes B-mode imaging, spectral Doppler, color Doppler, and power Doppler as needed of all accessible portions of each vessel. Bilateral testing is considered an integral part of a complete examination. Limited examinations for reoccurring indications may be performed as noted.   +-----------+--------+-----+--------+----------+--------+ LEFT       PSV cm/sRatioStenosisWaveform  Comments +-----------+--------+-----+--------+----------+--------+ CFA Distal 151                  biphasic           +-----------+--------+-----+--------+----------+--------+ DFA        98                   biphasic           +-----------+--------+-----+--------+----------+--------+ SFA Prox   101                   biphasic           +-----------+--------+-----+--------+----------+--------+ SFA Mid    94                   biphasic           +-----------+--------+-----+--------+----------+--------+ SFA Distal 125                  biphasic           +-----------+--------+-----+--------+----------+--------+ POP Prox   111                  biphasic           +-----------+--------+-----+--------+----------+--------+ POP Distal 87                   monophasic         +-----------+--------+-----+--------+----------+--------+ ATA Mid                 occluded                   +-----------+--------+-----+--------+----------+--------+  skin antiseptic. A timeout was performed prior to the initiation of the procedure. The retention balloon was completely deflated. The tube was removed. An angled catheter and Glidewire were advanced through the ostomy, through the stomach, duodenum and into the proximal jejunum. A new Infit 72 French gastrojejunostomy tube was then advanced over the wire and into the proximal small bowel. The retention balloon was inflated with 8 mL sterile saline and pulled snug against the anterior abdominal wall. Contrast material was injected through the tube confirming its  location in the proximal small bowel. The external bumper was fixed in place. IMPRESSION: Successful exchange for a new 22 French gastro jejunostomy tube. Electronically Signed   By: Jacqulynn Cadet M.D.   On: 10/05/2020 14:26   DG Foot Complete Left  Result Date: 09/23/2020 Please see detailed radiograph report in office note.  DG Foot Complete Right  Result Date: 09/23/2020 Please see detailed radiograph report in office note.  VAS [place] LOWER EXTREMITY ARTERIAL DUPLEX  Result Date: 10/01/2020 LOWER EXTREMITY ARTERIAL DUPLEX STUDY Patient Name:  Shannon [name]  Date of Exam:   10/01/2020 Medical Rec #: [id number]      Accession #:    [id number] Date of Birth: 1923-06-27      Patient Gender: F Patient Age:   89Y Exam Location:  [facility] Procedure:      VAS [place] LOWER EXTREMITY ARTERIAL DUPLEX Referring Phys: [id number] [name] HAWKEN --------------------------------------------------------------------------------  Indications: Pre op left transmetatarsal amputation. High Risk Factors: Hypertension.  Current ABI: 09/10/20 Left: Noncompressible with monophasic waveforms Comparison Study: none Performing Technologist: June Leap RDMS, RVT  Examination Guidelines: A complete evaluation includes B-mode imaging, spectral Doppler, color Doppler, and power Doppler as needed of all accessible portions of each vessel. Bilateral testing is considered an integral part of a complete examination. Limited examinations for reoccurring indications may be performed as noted.   +-----------+--------+-----+--------+----------+--------+ LEFT       PSV cm/sRatioStenosisWaveform  Comments +-----------+--------+-----+--------+----------+--------+ CFA Distal 151                  biphasic           +-----------+--------+-----+--------+----------+--------+ DFA        98                   biphasic           +-----------+--------+-----+--------+----------+--------+ SFA Prox   101                   biphasic           +-----------+--------+-----+--------+----------+--------+ SFA Mid    94                   biphasic           +-----------+--------+-----+--------+----------+--------+ SFA Distal 125                  biphasic           +-----------+--------+-----+--------+----------+--------+ POP Prox   111                  biphasic           +-----------+--------+-----+--------+----------+--------+ POP Distal 87                   monophasic         +-----------+--------+-----+--------+----------+--------+ ATA Mid                 occluded                   +-----------+--------+-----+--------+----------+--------+  Physician Discharge Summary  Shannon [name] ZOX:[id number] DOB: 01-Oct-1923 DOA: 10/10/2020  PCP: [name], NP  Admit date: 10/10/2020 Discharge date: 10/13/2020  Patient medically stable for discharge - unfortunately her GJ tube became dislodged prior to admission - working with IR who is attempting to obtain specialized tube for replacement. Once definitive route for medication/nutrition is obtained will discharge back home with family.  Time spent: 25 minutes  Recommendations for Outpatient Follow-up:  Coumadin discontinued this admission-Long discussion with family regarding risk-benefit E TC--we will need to continue these discussions in the outpatient setting New med: Protonix twice daily, Carafate 3 times daily Recommend CBC Chem-12 within 1 week from discharge Needs to follow-up with vascular surgeon for rescheduled procedure 10/20/2020---I have CCed them May need to discontinue insulin given A1c is below 7 in the very elderly--please discuss with patient as an outpatient as per PCP   Discharge Diagnoses:  MAIN problem for hospitalization   Acute upper abdominal GI bleed:  Discharge Condition:  Improved  Diet recommendation:  Prior  Filed Weights   10/10/20 2222 10/12/20 2102  Weight: 54.4 kg 54.4 kg    History of present illness:  87 year old DNR black female; Pyloric stenosis + G-tube with prior dislodgments in January 2022 status post replacement CKD III A, Atrial fibrillation CHADS2 score >3/Coumadin, DM TY 2 A1c 6.2 09/2020, HFpEF NYHA class IV EF 65-70% 05/19/2019, PAD with right foot ulcerations status post transplant R foot 11/21/2019, Evaluated [facility] ED 10/09/2018 to 10/09/2020 as per HPI with PEG site bleeding which was cauterized-apparently had J-tube exchange 10/05/2020 in addition by IR - subsequently found to have bloody bowel movements and came back to the emergency room. Hemoglobin dropped from 9 range to 6 range Rx vitamin K Rx 2 units PRBC in ED - GI  consulted  Hospital Course:  Acute upper GI bleed Secondary to elevated INR in the 7 range-reversed with vitamin K in the ED GI evaluated and scoped as below Hemodynamically more stable at this time and able to resume liquid diet as per prior stabilized this admission for discharge Protonix twice daily ongoing, Carafate Rx given for at least 2 to 3 weeks Severe pyloric stenosis + GHS with dislodgment in 04/22/2020 and again subsequently 10/09/2020 with exchange of J-tube 10/05/2020 Requires interval supervision per PCP/IR Tube seems to be functioning and is flushing-continue nighttime feeds 1 can and regular flushing as per interval Atrial fibrillation CHADS2 score >4 on Coumadin Prior DVT while previously on NOAC/DOAC resulting in use of Coumadin Long discussion at the bedside with patient/daughter-discussed risks and benefits of Coumadin in terms of yearly risk reduction CVA-also discussed risk of catastrophic bleeding Patient wants to defer final decision about resumption of Coumadin into the outpatient setting-Coumadin held on discharge HFpEF NYHA class IV EF 65-70% 05/2019 Relatively euvolemic this admission-resumed Imdur 60, amlodipine 5, Lasix 20 as needed/20 daily as per home meds on discharge Consider discontinuation hydralazine-3 times daily medication-reduce risk of noncompliance/polypharmacy in the elderly DM TY 2 A1c 6.2 Unclear mortality benefit-high risk hypoglycemia-outpatient consideration of discontinuation insulin-resume Lantus for now at bedtime on discharge     Procedures:  impression:               - LA Grade C reflux esophagitis with bleeding.                           - Benign-appearing esophageal stenosis. Dilated  skin antiseptic. A timeout was performed prior to the initiation of the procedure. The retention balloon was completely deflated. The tube was removed. An angled catheter and Glidewire were advanced through the ostomy, through the stomach, duodenum and into the proximal jejunum. A new Infit 72 French gastrojejunostomy tube was then advanced over the wire and into the proximal small bowel. The retention balloon was inflated with 8 mL sterile saline and pulled snug against the anterior abdominal wall. Contrast material was injected through the tube confirming its  location in the proximal small bowel. The external bumper was fixed in place. IMPRESSION: Successful exchange for a new 22 French gastro jejunostomy tube. Electronically Signed   By: Jacqulynn Cadet M.D.   On: 10/05/2020 14:26   DG Foot Complete Left  Result Date: 09/23/2020 Please see detailed radiograph report in office note.  DG Foot Complete Right  Result Date: 09/23/2020 Please see detailed radiograph report in office note.  VAS [place] LOWER EXTREMITY ARTERIAL DUPLEX  Result Date: 10/01/2020 LOWER EXTREMITY ARTERIAL DUPLEX STUDY Patient Name:  Shannon [name]  Date of Exam:   10/01/2020 Medical Rec #: [id number]      Accession #:    [id number] Date of Birth: 1923-06-27      Patient Gender: F Patient Age:   89Y Exam Location:  [facility] Procedure:      VAS [place] LOWER EXTREMITY ARTERIAL DUPLEX Referring Phys: [id number] [name] HAWKEN --------------------------------------------------------------------------------  Indications: Pre op left transmetatarsal amputation. High Risk Factors: Hypertension.  Current ABI: 09/10/20 Left: Noncompressible with monophasic waveforms Comparison Study: none Performing Technologist: June Leap RDMS, RVT  Examination Guidelines: A complete evaluation includes B-mode imaging, spectral Doppler, color Doppler, and power Doppler as needed of all accessible portions of each vessel. Bilateral testing is considered an integral part of a complete examination. Limited examinations for reoccurring indications may be performed as noted.   +-----------+--------+-----+--------+----------+--------+ LEFT       PSV cm/sRatioStenosisWaveform  Comments +-----------+--------+-----+--------+----------+--------+ CFA Distal 151                  biphasic           +-----------+--------+-----+--------+----------+--------+ DFA        98                   biphasic           +-----------+--------+-----+--------+----------+--------+ SFA Prox   101                   biphasic           +-----------+--------+-----+--------+----------+--------+ SFA Mid    94                   biphasic           +-----------+--------+-----+--------+----------+--------+ SFA Distal 125                  biphasic           +-----------+--------+-----+--------+----------+--------+ POP Prox   111                  biphasic           +-----------+--------+-----+--------+----------+--------+ POP Distal 87                   monophasic         +-----------+--------+-----+--------+----------+--------+ ATA Mid                 occluded                   +-----------+--------+-----+--------+----------+--------+

## 2020-10-13 NOTE — Progress Notes (Signed)
Patient's CBG=78,MD,Chotiner notified via secure chat. Order to recheck cbg=81. Lantus given per MD. Will continue to monitor.

## 2020-10-14 ENCOUNTER — Inpatient Hospital Stay (HOSPITAL_COMMUNITY): Payer: Medicare Other

## 2020-10-14 ENCOUNTER — Telehealth: Payer: Self-pay | Admitting: *Deleted

## 2020-10-14 DIAGNOSIS — N1831 Chronic kidney disease, stage 3a: Secondary | ICD-10-CM

## 2020-10-14 DIAGNOSIS — K219 Gastro-esophageal reflux disease without esophagitis: Secondary | ICD-10-CM

## 2020-10-14 HISTORY — PX: IR GJ TUBE CHANGE: IMG1440

## 2020-10-14 LAB — COMPREHENSIVE METABOLIC PANEL
ALT: 15 U/L (ref 0–44)
AST: 23 U/L (ref 15–41)
Albumin: 2.4 g/dL — ABNORMAL LOW (ref 3.5–5.0)
Alkaline Phosphatase: 66 U/L (ref 38–126)
Anion gap: 5 (ref 5–15)
BUN: 26 mg/dL — ABNORMAL HIGH (ref 8–23)
CO2: 26 mmol/L (ref 22–32)
Calcium: 8.6 mg/dL — ABNORMAL LOW (ref 8.9–10.3)
Chloride: 103 mmol/L (ref 98–111)
Creatinine, Ser: 1.27 mg/dL — ABNORMAL HIGH (ref 0.44–1.00)
GFR, Estimated: 39 mL/min — ABNORMAL LOW (ref >60)
Glucose, Bld: 60 mg/dL — ABNORMAL LOW (ref 70–99)
Potassium: 3.3 mmol/L — ABNORMAL LOW (ref 3.5–5.1)
Sodium: 134 mmol/L — ABNORMAL LOW (ref 135–145)
Total Bilirubin: 0.6 mg/dL (ref 0.3–1.2)
Total Protein: 6 g/dL — ABNORMAL LOW (ref 6.5–8.1)

## 2020-10-14 LAB — GLUCOSE, CAPILLARY
Glucose-Capillary: 118 mg/dL — ABNORMAL HIGH (ref 70–99)
Glucose-Capillary: 129 mg/dL — ABNORMAL HIGH (ref 70–99)
Glucose-Capillary: 161 mg/dL — ABNORMAL HIGH (ref 70–99)
Glucose-Capillary: 47 mg/dL — ABNORMAL LOW (ref 70–99)
Glucose-Capillary: 83 mg/dL (ref 70–99)
Glucose-Capillary: 91 mg/dL (ref 70–99)

## 2020-10-14 MED ORDER — DEXTROSE 50 % IV SOLN
25.0000 g | INTRAVENOUS | Status: AC
  Administered 2020-10-14: 25 g via INTRAVENOUS

## 2020-10-14 MED ORDER — IOHEXOL 300 MG/ML  SOLN: 50.0000 mL | Freq: Once | INTRAMUSCULAR | Status: DC | PRN

## 2020-10-14 MED ORDER — LIDOCAINE VISCOUS HCL 2 % MT SOLN
OROMUCOSAL | Status: AC
  Filled 2020-10-14: qty 15

## 2020-10-14 MED ORDER — DEXTROSE 50 % IV SOLN
INTRAVENOUS | Status: AC
  Filled 2020-10-14: qty 50

## 2020-10-14 NOTE — Progress Notes (Signed)
Hypoglycemic Event  CBG: 47  Treatment: D50 50 mL (25 gm)  Symptoms: None  Follow-up CBG: [id number] CBG Result:161  Possible Reasons for Event: Inadequate meal intake  Comments/MD notified:per hypoglycemia protocol    Shannon [name]

## 2020-10-14 NOTE — Procedures (Signed)
Pre procedural Dx: Dysphagia, malpositioned feeding tube. Post procedural Dx: Same  Successful fluoroscopic guided exchange and repositioning of chronic GJ tube.   The feeding tube is ready for immediate use.  EBL: None Complications: None immediate.  Ronny Bacon, MD Pager #: [phone]

## 2020-10-14 NOTE — Plan of Care (Signed)

## 2020-10-14 NOTE — Progress Notes (Signed)
DISCHARGE NOTE HOME Shannon [name] to be discharged Home per MD order. Discussed prescriptions and follow up appointments with the patient. Prescriptions given to patient; medication list explained in detail. Patient verbalized understanding.  Skin clean, dry and intact without evidence of skin break down, no evidence of skin tears noted. IV catheter discontinued intact. Site without signs and symptoms of complications. Dressing and pressure applied. Pt denies pain at the site currently. No complaints noted.  Patient free of line  drain   An After Visit Summary (AVS) was printed and given to the patient. Patient escorted via wheelchair, and discharged home via private auto.  [name], RN , BSN

## 2020-10-14 NOTE — Telephone Encounter (Signed)
Received Prior Authorization for patient's Oxycodone from [facility] Fax: [phone].   Placed form in [place] folder to review and fill out. To be faxed back once completed.

## 2020-10-14 NOTE — Discharge Summary (Signed)
ANESTHESIA/SEDATION: None. CONTRAST:  36mL OMNIPAQUE IOHEXOL 300 MG/ML SOLN - administered into the gastric lumen. FLUOROSCOPY TIME:  Fluoroscopy Time: 3 minutes 36 seconds (22 mGy). COMPLICATIONS: None immediate. PROCEDURE: Informed written consent was obtained from the patient after a thorough discussion of the procedural risks, benefits and alternatives. All questions were addressed. Maximal Sterile Barrier Technique was utilized including caps, mask, sterile gowns, sterile gloves, sterile drape, hand hygiene and skin antiseptic. A timeout was performed prior to the initiation of the procedure. The retention balloon was completely deflated. The tube was removed. An angled catheter and Glidewire were advanced through the ostomy, through the stomach, duodenum  and into the proximal jejunum. A new Infit 28 French gastrojejunostomy tube was then advanced over the wire and into the proximal small bowel. The retention balloon was inflated with 8 mL sterile saline and pulled snug against the anterior abdominal wall. Contrast material was injected through the tube confirming its location in the proximal small bowel. The external bumper was fixed in place. IMPRESSION: Successful exchange for a new 22 French gastro jejunostomy tube. Electronically Signed   By: Jacqulynn Cadet M.D.   On: 10/05/2020 14:26   DG Foot Complete Left  Result Date: 09/23/2020 Please see detailed radiograph report in office note.  DG Foot Complete Right  Result Date: 09/23/2020 Please see detailed radiograph report in office note.  VAS [place] LOWER EXTREMITY ARTERIAL DUPLEX  Result Date: 10/01/2020 LOWER EXTREMITY ARTERIAL DUPLEX STUDY Patient Name:  Shannon [name]  Date of Exam:   10/01/2020 Medical Rec #: [id number]      Accession #:    [id number] Date of Birth: 1924-02-27      Patient Gender: F Patient Age:   87Y Exam Location:  [facility] Procedure:      VAS [place] LOWER EXTREMITY ARTERIAL DUPLEX Referring Phys: [id number] [name] HAWKEN --------------------------------------------------------------------------------  Indications: Pre op left transmetatarsal amputation. High Risk Factors: Hypertension.  Current ABI: 09/10/20 Left: Noncompressible with monophasic waveforms Comparison Study: none Performing Technologist: June Leap RDMS, RVT  Examination Guidelines: A complete evaluation includes B-mode imaging, spectral Doppler, color Doppler, and power Doppler as needed of all accessible portions of each vessel. Bilateral testing is considered an integral part of a complete examination. Limited examinations for reoccurring indications may be performed as noted.   +-----------+--------+-----+--------+----------+--------+ LEFT       PSV cm/sRatioStenosisWaveform  Comments  +-----------+--------+-----+--------+----------+--------+ CFA Distal 151                  biphasic           +-----------+--------+-----+--------+----------+--------+ DFA        98                   biphasic           +-----------+--------+-----+--------+----------+--------+ SFA Prox   101                  biphasic           +-----------+--------+-----+--------+----------+--------+ SFA Mid    94                   biphasic           +-----------+--------+-----+--------+----------+--------+ SFA Distal 125                  biphasic           +-----------+--------+-----+--------+----------+--------+ POP Prox   111  Physician Discharge Summary  Shannon [name]:[id number] DOB: Jan 26, 1924 DOA: 10/10/2020  PCP: [name], NP  Admit date: 10/10/2020 Discharge date: 10/14/2020  Patient medically stable for discharge - unfortunately her GJ tube became dislodged prior to admission - working with IR who is attempting to obtain specialized tube for replacement. Once definitive route for medication/nutrition is obtained will discharge back home with family. Mild hypoglycemia this am in the setting of peg tube dysfunction. Peg tube successfully replaced 10/14/20 with no issues - tube ready to use and in good position - patient otherwise stable and agreeable for discharge.  Time spent: 25 minutes  Recommendations for Outpatient Follow-up:  Coumadin discontinued this admission-Long discussion with family regarding risk-benefit E TC--we will need to continue these discussions in the outpatient setting New med: Protonix twice daily, Carafate 3 times daily Recommend CBC Chem-12 within 1 week from discharge Needs to follow-up with vascular surgeon for rescheduled procedure 10/20/2020---I have CCed them May need to discontinue insulin given A1c is below 7 in the very elderly--please discuss with patient as an outpatient as per PCP   Discharge Diagnoses:  Acute upper abdominal GI bleed  Discharge Condition:  Improved  Diet recommendation:  Prior  Filed Weights   10/10/20 2222 10/12/20 2102 10/13/20 2046  Weight: 54.4 kg 54.4 kg 54.4 kg    History of present illness:  87 year old DNR black female; Pyloric stenosis + G-tube with prior dislodgments in January 2022 status post replacement CKD III A, Atrial fibrillation CHADS2 score >3/Coumadin, DM TY 2 A1c 6.2 09/2020, HFpEF NYHA class IV EF 65-70% 05/19/2019, PAD with right foot ulcerations status post transplant R foot 11/21/2019, Evaluated [facility] ED 10/09/2018 to 10/09/2020 as per HPI with PEG site bleeding which was cauterized-apparently had J-tube exchange 10/05/2020  in addition by IR - subsequently found to have bloody bowel movements and came back to the emergency room. Hemoglobin dropped from 9 range to 6 range Rx vitamin K Rx 2 units PRBC in ED - GI consulted  Hospital Course:  Acute upper GI bleed Secondary to elevated INR in the 7 range-reversed with vitamin K in the ED GI evaluated and scoped as below Hemodynamically more stable at this time and able to resume liquid diet as per prior stabilized this admission for discharge Protonix twice daily ongoing, Carafate Rx given for at least 2 to 3 weeks Severe pyloric stenosis + GHS with dislodgment in 04/22/2020 and again subsequently 10/09/2020 with exchange of J-tube 10/05/2020 Requires interval supervision per PCP/IR Tube seems to be functioning and is flushing-continue nighttime feeds 1 can and regular flushing as per interval Atrial fibrillation CHADS2 score >4 on Coumadin Prior DVT while previously on NOAC/DOAC resulting in use of Coumadin Long discussion at the bedside with patient/daughter-discussed risks and benefits of Coumadin in terms of yearly risk reduction CVA-also discussed risk of catastrophic bleeding Patient wants to defer final decision about resumption of Coumadin into the outpatient setting-Coumadin held on discharge HFpEF NYHA class IV EF 65-70% 05/2019 Relatively euvolemic this admission-resumed Imdur 60, amlodipine 5, Lasix 20 as needed/20 daily as per home meds on discharge Consider discontinuation hydralazine-3 times daily medication-reduce risk of noncompliance/polypharmacy in the elderly DM TY 2 A1c 6.2 Unclear mortality benefit-high risk hypoglycemia-outpatient consideration of discontinuation insulin-resume Lantus for now at bedtime on discharge Incidental dislodged GJ tube  Replaced successfully 10/14/20 by IR - ready for use     Procedures:  impression:               -  ANESTHESIA/SEDATION: None. CONTRAST:  36mL OMNIPAQUE IOHEXOL 300 MG/ML SOLN - administered into the gastric lumen. FLUOROSCOPY TIME:  Fluoroscopy Time: 3 minutes 36 seconds (22 mGy). COMPLICATIONS: None immediate. PROCEDURE: Informed written consent was obtained from the patient after a thorough discussion of the procedural risks, benefits and alternatives. All questions were addressed. Maximal Sterile Barrier Technique was utilized including caps, mask, sterile gowns, sterile gloves, sterile drape, hand hygiene and skin antiseptic. A timeout was performed prior to the initiation of the procedure. The retention balloon was completely deflated. The tube was removed. An angled catheter and Glidewire were advanced through the ostomy, through the stomach, duodenum  and into the proximal jejunum. A new Infit 28 French gastrojejunostomy tube was then advanced over the wire and into the proximal small bowel. The retention balloon was inflated with 8 mL sterile saline and pulled snug against the anterior abdominal wall. Contrast material was injected through the tube confirming its location in the proximal small bowel. The external bumper was fixed in place. IMPRESSION: Successful exchange for a new 22 French gastro jejunostomy tube. Electronically Signed   By: Jacqulynn Cadet M.D.   On: 10/05/2020 14:26   DG Foot Complete Left  Result Date: 09/23/2020 Please see detailed radiograph report in office note.  DG Foot Complete Right  Result Date: 09/23/2020 Please see detailed radiograph report in office note.  VAS [place] LOWER EXTREMITY ARTERIAL DUPLEX  Result Date: 10/01/2020 LOWER EXTREMITY ARTERIAL DUPLEX STUDY Patient Name:  Shannon [name]  Date of Exam:   10/01/2020 Medical Rec #: [id number]      Accession #:    [id number] Date of Birth: 1924-02-27      Patient Gender: F Patient Age:   87Y Exam Location:  [facility] Procedure:      VAS [place] LOWER EXTREMITY ARTERIAL DUPLEX Referring Phys: [id number] [name] HAWKEN --------------------------------------------------------------------------------  Indications: Pre op left transmetatarsal amputation. High Risk Factors: Hypertension.  Current ABI: 09/10/20 Left: Noncompressible with monophasic waveforms Comparison Study: none Performing Technologist: June Leap RDMS, RVT  Examination Guidelines: A complete evaluation includes B-mode imaging, spectral Doppler, color Doppler, and power Doppler as needed of all accessible portions of each vessel. Bilateral testing is considered an integral part of a complete examination. Limited examinations for reoccurring indications may be performed as noted.   +-----------+--------+-----+--------+----------+--------+ LEFT       PSV cm/sRatioStenosisWaveform  Comments  +-----------+--------+-----+--------+----------+--------+ CFA Distal 151                  biphasic           +-----------+--------+-----+--------+----------+--------+ DFA        98                   biphasic           +-----------+--------+-----+--------+----------+--------+ SFA Prox   101                  biphasic           +-----------+--------+-----+--------+----------+--------+ SFA Mid    94                   biphasic           +-----------+--------+-----+--------+----------+--------+ SFA Distal 125                  biphasic           +-----------+--------+-----+--------+----------+--------+ POP Prox   111  Physician Discharge Summary  Shannon [name]:[id number] DOB: Jan 26, 1924 DOA: 10/10/2020  PCP: [name], NP  Admit date: 10/10/2020 Discharge date: 10/14/2020  Patient medically stable for discharge - unfortunately her GJ tube became dislodged prior to admission - working with IR who is attempting to obtain specialized tube for replacement. Once definitive route for medication/nutrition is obtained will discharge back home with family. Mild hypoglycemia this am in the setting of peg tube dysfunction. Peg tube successfully replaced 10/14/20 with no issues - tube ready to use and in good position - patient otherwise stable and agreeable for discharge.  Time spent: 25 minutes  Recommendations for Outpatient Follow-up:  Coumadin discontinued this admission-Long discussion with family regarding risk-benefit E TC--we will need to continue these discussions in the outpatient setting New med: Protonix twice daily, Carafate 3 times daily Recommend CBC Chem-12 within 1 week from discharge Needs to follow-up with vascular surgeon for rescheduled procedure 10/20/2020---I have CCed them May need to discontinue insulin given A1c is below 7 in the very elderly--please discuss with patient as an outpatient as per PCP   Discharge Diagnoses:  Acute upper abdominal GI bleed  Discharge Condition:  Improved  Diet recommendation:  Prior  Filed Weights   10/10/20 2222 10/12/20 2102 10/13/20 2046  Weight: 54.4 kg 54.4 kg 54.4 kg    History of present illness:  87 year old DNR black female; Pyloric stenosis + G-tube with prior dislodgments in January 2022 status post replacement CKD III A, Atrial fibrillation CHADS2 score >3/Coumadin, DM TY 2 A1c 6.2 09/2020, HFpEF NYHA class IV EF 65-70% 05/19/2019, PAD with right foot ulcerations status post transplant R foot 11/21/2019, Evaluated [facility] ED 10/09/2018 to 10/09/2020 as per HPI with PEG site bleeding which was cauterized-apparently had J-tube exchange 10/05/2020  in addition by IR - subsequently found to have bloody bowel movements and came back to the emergency room. Hemoglobin dropped from 9 range to 6 range Rx vitamin K Rx 2 units PRBC in ED - GI consulted  Hospital Course:  Acute upper GI bleed Secondary to elevated INR in the 7 range-reversed with vitamin K in the ED GI evaluated and scoped as below Hemodynamically more stable at this time and able to resume liquid diet as per prior stabilized this admission for discharge Protonix twice daily ongoing, Carafate Rx given for at least 2 to 3 weeks Severe pyloric stenosis + GHS with dislodgment in 04/22/2020 and again subsequently 10/09/2020 with exchange of J-tube 10/05/2020 Requires interval supervision per PCP/IR Tube seems to be functioning and is flushing-continue nighttime feeds 1 can and regular flushing as per interval Atrial fibrillation CHADS2 score >4 on Coumadin Prior DVT while previously on NOAC/DOAC resulting in use of Coumadin Long discussion at the bedside with patient/daughter-discussed risks and benefits of Coumadin in terms of yearly risk reduction CVA-also discussed risk of catastrophic bleeding Patient wants to defer final decision about resumption of Coumadin into the outpatient setting-Coumadin held on discharge HFpEF NYHA class IV EF 65-70% 05/2019 Relatively euvolemic this admission-resumed Imdur 60, amlodipine 5, Lasix 20 as needed/20 daily as per home meds on discharge Consider discontinuation hydralazine-3 times daily medication-reduce risk of noncompliance/polypharmacy in the elderly DM TY 2 A1c 6.2 Unclear mortality benefit-high risk hypoglycemia-outpatient consideration of discontinuation insulin-resume Lantus for now at bedtime on discharge Incidental dislodged GJ tube  Replaced successfully 10/14/20 by IR - ready for use     Procedures:  impression:               -  ANESTHESIA/SEDATION: None. CONTRAST:  36mL OMNIPAQUE IOHEXOL 300 MG/ML SOLN - administered into the gastric lumen. FLUOROSCOPY TIME:  Fluoroscopy Time: 3 minutes 36 seconds (22 mGy). COMPLICATIONS: None immediate. PROCEDURE: Informed written consent was obtained from the patient after a thorough discussion of the procedural risks, benefits and alternatives. All questions were addressed. Maximal Sterile Barrier Technique was utilized including caps, mask, sterile gowns, sterile gloves, sterile drape, hand hygiene and skin antiseptic. A timeout was performed prior to the initiation of the procedure. The retention balloon was completely deflated. The tube was removed. An angled catheter and Glidewire were advanced through the ostomy, through the stomach, duodenum  and into the proximal jejunum. A new Infit 28 French gastrojejunostomy tube was then advanced over the wire and into the proximal small bowel. The retention balloon was inflated with 8 mL sterile saline and pulled snug against the anterior abdominal wall. Contrast material was injected through the tube confirming its location in the proximal small bowel. The external bumper was fixed in place. IMPRESSION: Successful exchange for a new 22 French gastro jejunostomy tube. Electronically Signed   By: Jacqulynn Cadet M.D.   On: 10/05/2020 14:26   DG Foot Complete Left  Result Date: 09/23/2020 Please see detailed radiograph report in office note.  DG Foot Complete Right  Result Date: 09/23/2020 Please see detailed radiograph report in office note.  VAS [place] LOWER EXTREMITY ARTERIAL DUPLEX  Result Date: 10/01/2020 LOWER EXTREMITY ARTERIAL DUPLEX STUDY Patient Name:  Shannon [name]  Date of Exam:   10/01/2020 Medical Rec #: [id number]      Accession #:    [id number] Date of Birth: 1924-02-27      Patient Gender: F Patient Age:   87Y Exam Location:  [facility] Procedure:      VAS [place] LOWER EXTREMITY ARTERIAL DUPLEX Referring Phys: [id number] [name] HAWKEN --------------------------------------------------------------------------------  Indications: Pre op left transmetatarsal amputation. High Risk Factors: Hypertension.  Current ABI: 09/10/20 Left: Noncompressible with monophasic waveforms Comparison Study: none Performing Technologist: June Leap RDMS, RVT  Examination Guidelines: A complete evaluation includes B-mode imaging, spectral Doppler, color Doppler, and power Doppler as needed of all accessible portions of each vessel. Bilateral testing is considered an integral part of a complete examination. Limited examinations for reoccurring indications may be performed as noted.   +-----------+--------+-----+--------+----------+--------+ LEFT       PSV cm/sRatioStenosisWaveform  Comments  +-----------+--------+-----+--------+----------+--------+ CFA Distal 151                  biphasic           +-----------+--------+-----+--------+----------+--------+ DFA        98                   biphasic           +-----------+--------+-----+--------+----------+--------+ SFA Prox   101                  biphasic           +-----------+--------+-----+--------+----------+--------+ SFA Mid    94                   biphasic           +-----------+--------+-----+--------+----------+--------+ SFA Distal 125                  biphasic           +-----------+--------+-----+--------+----------+--------+ POP Prox   111  ANESTHESIA/SEDATION: None. CONTRAST:  36mL OMNIPAQUE IOHEXOL 300 MG/ML SOLN - administered into the gastric lumen. FLUOROSCOPY TIME:  Fluoroscopy Time: 3 minutes 36 seconds (22 mGy). COMPLICATIONS: None immediate. PROCEDURE: Informed written consent was obtained from the patient after a thorough discussion of the procedural risks, benefits and alternatives. All questions were addressed. Maximal Sterile Barrier Technique was utilized including caps, mask, sterile gowns, sterile gloves, sterile drape, hand hygiene and skin antiseptic. A timeout was performed prior to the initiation of the procedure. The retention balloon was completely deflated. The tube was removed. An angled catheter and Glidewire were advanced through the ostomy, through the stomach, duodenum  and into the proximal jejunum. A new Infit 28 French gastrojejunostomy tube was then advanced over the wire and into the proximal small bowel. The retention balloon was inflated with 8 mL sterile saline and pulled snug against the anterior abdominal wall. Contrast material was injected through the tube confirming its location in the proximal small bowel. The external bumper was fixed in place. IMPRESSION: Successful exchange for a new 22 French gastro jejunostomy tube. Electronically Signed   By: Jacqulynn Cadet M.D.   On: 10/05/2020 14:26   DG Foot Complete Left  Result Date: 09/23/2020 Please see detailed radiograph report in office note.  DG Foot Complete Right  Result Date: 09/23/2020 Please see detailed radiograph report in office note.  VAS [place] LOWER EXTREMITY ARTERIAL DUPLEX  Result Date: 10/01/2020 LOWER EXTREMITY ARTERIAL DUPLEX STUDY Patient Name:  Shannon [name]  Date of Exam:   10/01/2020 Medical Rec #: [id number]      Accession #:    [id number] Date of Birth: 1924-02-27      Patient Gender: F Patient Age:   87Y Exam Location:  [facility] Procedure:      VAS [place] LOWER EXTREMITY ARTERIAL DUPLEX Referring Phys: [id number] [name] HAWKEN --------------------------------------------------------------------------------  Indications: Pre op left transmetatarsal amputation. High Risk Factors: Hypertension.  Current ABI: 09/10/20 Left: Noncompressible with monophasic waveforms Comparison Study: none Performing Technologist: June Leap RDMS, RVT  Examination Guidelines: A complete evaluation includes B-mode imaging, spectral Doppler, color Doppler, and power Doppler as needed of all accessible portions of each vessel. Bilateral testing is considered an integral part of a complete examination. Limited examinations for reoccurring indications may be performed as noted.   +-----------+--------+-----+--------+----------+--------+ LEFT       PSV cm/sRatioStenosisWaveform  Comments  +-----------+--------+-----+--------+----------+--------+ CFA Distal 151                  biphasic           +-----------+--------+-----+--------+----------+--------+ DFA        98                   biphasic           +-----------+--------+-----+--------+----------+--------+ SFA Prox   101                  biphasic           +-----------+--------+-----+--------+----------+--------+ SFA Mid    94                   biphasic           +-----------+--------+-----+--------+----------+--------+ SFA Distal 125                  biphasic           +-----------+--------+-----+--------+----------+--------+ POP Prox   111  ANESTHESIA/SEDATION: None. CONTRAST:  36mL OMNIPAQUE IOHEXOL 300 MG/ML SOLN - administered into the gastric lumen. FLUOROSCOPY TIME:  Fluoroscopy Time: 3 minutes 36 seconds (22 mGy). COMPLICATIONS: None immediate. PROCEDURE: Informed written consent was obtained from the patient after a thorough discussion of the procedural risks, benefits and alternatives. All questions were addressed. Maximal Sterile Barrier Technique was utilized including caps, mask, sterile gowns, sterile gloves, sterile drape, hand hygiene and skin antiseptic. A timeout was performed prior to the initiation of the procedure. The retention balloon was completely deflated. The tube was removed. An angled catheter and Glidewire were advanced through the ostomy, through the stomach, duodenum  and into the proximal jejunum. A new Infit 28 French gastrojejunostomy tube was then advanced over the wire and into the proximal small bowel. The retention balloon was inflated with 8 mL sterile saline and pulled snug against the anterior abdominal wall. Contrast material was injected through the tube confirming its location in the proximal small bowel. The external bumper was fixed in place. IMPRESSION: Successful exchange for a new 22 French gastro jejunostomy tube. Electronically Signed   By: Jacqulynn Cadet M.D.   On: 10/05/2020 14:26   DG Foot Complete Left  Result Date: 09/23/2020 Please see detailed radiograph report in office note.  DG Foot Complete Right  Result Date: 09/23/2020 Please see detailed radiograph report in office note.  VAS [place] LOWER EXTREMITY ARTERIAL DUPLEX  Result Date: 10/01/2020 LOWER EXTREMITY ARTERIAL DUPLEX STUDY Patient Name:  Shannon [name]  Date of Exam:   10/01/2020 Medical Rec #: [id number]      Accession #:    [id number] Date of Birth: 1924-02-27      Patient Gender: F Patient Age:   87Y Exam Location:  [facility] Procedure:      VAS [place] LOWER EXTREMITY ARTERIAL DUPLEX Referring Phys: [id number] [name] HAWKEN --------------------------------------------------------------------------------  Indications: Pre op left transmetatarsal amputation. High Risk Factors: Hypertension.  Current ABI: 09/10/20 Left: Noncompressible with monophasic waveforms Comparison Study: none Performing Technologist: June Leap RDMS, RVT  Examination Guidelines: A complete evaluation includes B-mode imaging, spectral Doppler, color Doppler, and power Doppler as needed of all accessible portions of each vessel. Bilateral testing is considered an integral part of a complete examination. Limited examinations for reoccurring indications may be performed as noted.   +-----------+--------+-----+--------+----------+--------+ LEFT       PSV cm/sRatioStenosisWaveform  Comments  +-----------+--------+-----+--------+----------+--------+ CFA Distal 151                  biphasic           +-----------+--------+-----+--------+----------+--------+ DFA        98                   biphasic           +-----------+--------+-----+--------+----------+--------+ SFA Prox   101                  biphasic           +-----------+--------+-----+--------+----------+--------+ SFA Mid    94                   biphasic           +-----------+--------+-----+--------+----------+--------+ SFA Distal 125                  biphasic           +-----------+--------+-----+--------+----------+--------+ POP Prox   111

## 2020-10-15 ENCOUNTER — Other Ambulatory Visit: Payer: Self-pay

## 2020-10-15 ENCOUNTER — Other Ambulatory Visit (HOSPITAL_COMMUNITY): Payer: Self-pay | Admitting: Radiology

## 2020-10-15 ENCOUNTER — Telehealth: Payer: Self-pay | Admitting: *Deleted

## 2020-10-15 ENCOUNTER — Ambulatory Visit (INDEPENDENT_AMBULATORY_CARE_PROVIDER_SITE_OTHER): Payer: Medicare Other | Admitting: Nurse Practitioner

## 2020-10-15 ENCOUNTER — Other Ambulatory Visit: Payer: Self-pay | Admitting: *Deleted

## 2020-10-15 ENCOUNTER — Encounter: Payer: Self-pay | Admitting: Nurse Practitioner

## 2020-10-15 VITALS — BP 140/70 | HR 51 | Temp 97.7°F | Ht 66.0 in | Wt 125.2 lb

## 2020-10-15 DIAGNOSIS — E1142 Type 2 diabetes mellitus with diabetic polyneuropathy: Secondary | ICD-10-CM

## 2020-10-15 DIAGNOSIS — I70235 Atherosclerosis of native arteries of right leg with ulceration of other part of foot: Secondary | ICD-10-CM

## 2020-10-15 DIAGNOSIS — F325 Major depressive disorder, single episode, in full remission: Secondary | ICD-10-CM | POA: Diagnosis not present

## 2020-10-15 DIAGNOSIS — I10 Essential (primary) hypertension: Secondary | ICD-10-CM

## 2020-10-15 DIAGNOSIS — Z931 Gastrostomy status: Secondary | ICD-10-CM | POA: Diagnosis not present

## 2020-10-15 DIAGNOSIS — K921 Melena: Secondary | ICD-10-CM | POA: Diagnosis not present

## 2020-10-15 DIAGNOSIS — D500 Iron deficiency anemia secondary to blood loss (chronic): Secondary | ICD-10-CM

## 2020-10-15 DIAGNOSIS — L97523 Non-pressure chronic ulcer of other part of left foot with necrosis of muscle: Secondary | ICD-10-CM

## 2020-10-15 DIAGNOSIS — E11621 Type 2 diabetes mellitus with foot ulcer: Secondary | ICD-10-CM

## 2020-10-15 DIAGNOSIS — I4819 Other persistent atrial fibrillation: Secondary | ICD-10-CM | POA: Diagnosis not present

## 2020-10-15 DIAGNOSIS — R633 Feeding difficulties, unspecified: Secondary | ICD-10-CM

## 2020-10-15 MED ORDER — PANTOPRAZOLE SODIUM 40 MG PO TBEC
40.0000 mg | DELAYED_RELEASE_TABLET | Freq: Two times a day (BID) | ORAL | 2 refills | Status: DC
Start: 2020-10-15 — End: 2021-09-08

## 2020-10-15 NOTE — Telephone Encounter (Signed)
Transition Care Management Follow-up Telephone Call Date of discharge and from where: 10/14/2020 [facility]  How have you been since you were released from the hospital? better Any questions or concerns? Yes Medication Concerns. Appointment scheduled for today 7/15 to address.   Items Reviewed: Did the pt receive and understand the discharge instructions provided? Yes  Medications obtained and verified? Yes  Other? No  Any new allergies since your discharge? No  Dietary orders reviewed? Yes Do you have support at home? Yes   Home Care and Equipment/Supplies: Were home health services ordered? no If so, what is the name of the agency? na  Has the agency set up a time to come to the patient's home? not applicable Were any new equipment or medical supplies ordered?  No What is the name of the medical supply agency?  Were you able to get the supplies/equipment? not applicable Do you have any questions related to the use of the equipment or supplies? No  Functional Questionnaire: (I = Independent and D = Dependent) ADLs: I with assistance  Bathing/Dressing- D  Meal Prep- D  Eating- GTube  Maintaining continence- I  Transferring/Ambulation- I with assistance  Managing Meds- D  Follow up appointments reviewed:  PCP Hospital f/u appt confirmed? Yes  Scheduled to see Janett Billow on 10/15/2020 @ 1:30. [facility] f/u appt confirmed? No  . Are transportation arrangements needed? No  If their condition worsens, is the pt aware to call PCP or go to the Emergency Dept.? Yes Was the patient provided with contact information for the PCP's office or ED? Yes Was to pt encouraged to call back with questions or concerns? Yes

## 2020-10-15 NOTE — Progress Notes (Signed)
Careteam: Patient Care Team: [name], NP as PCP - General (Geriatric Medicine) Dorothy Spark, MD (Inactive) as PCP - Cardiology (Cardiology) Edrick Kins, DPM as Consulting Physician (Podiatry) Dorothy Spark, MD (Inactive) as Consulting Physician (Cardiology) [name], NP as Nurse Practitioner (Geriatric Medicine) Camillo Flaming, [place] as Referring Physician (Optometry) [name], RN as [place] Management  PLACE OF SERVICE:  [place] Directive information Does Patient Have a Medical Advance Directive?: Yes, Type of Advance Directive: Healthcare Power of [place];Living will, Does patient want to make changes to medical advance directive?: No - Patient declined  Allergies  Allergen Reactions   Penicillins Itching, Rash and Other (See Comments)    Tolerated amoxicillin, unasyn, zosyn & cephalosporins in the past. Did it involve swelling of the face/tongue/throat, SOB, or low BP? No Did it involve sudden or severe rash/hives, skin peeling, or any reaction on the inside of your mouth or nose? No Did you need to seek medical attention at a hospital or doctor's office? Unknown When did it last happen?      5+ years If all above answers are "NO", may proceed with cephalosporin use.     Chief Complaint  Patient presents with   [facility] follow up. Patient would like to discuss medications. Patient not feeling too strong from blood loss.     HPI: [name] for hospital follow up . Pt with complex medical history including Pyloric stenosis + G-tube with prior dislodgments in January 2022 status post replacement, CKD III A, Atrial fibrillation CHADS2 score >3/Coumadin, DM TY 2 A1c 6.2 09/2020, HFpEF NYHA class IV EF 65-70% 05/19/2019, PAD with right foot ulcerations status post transplant R foot 11/21/2019, Evaluated [facility] ED 10/09/2018 to 10/09/2020 as per HPI with PEG site bleeding which was  cauterized-apparently had J-tube exchange 10/05/2020 in addition by IR - subsequently found to have bloody bowel movements and came back to the emergency room. Hemoglobin dropped from 9 range to 6 range.  Rx 2 units PRBC in ED - GI consulted. INR elevated at 7, (coumadin had been stopped several days prior) reversed with Vit K.   She was discharged from hospital yesterday. Continues to be weak but getting stronger.   She continues to follow up with vascular doctor. Plans to stent leg to hopefully avoid amputation for toe. They are planning to do the stents on 7/20.   DM- occasionally would use novolog. Has not used lantus in several months. A1c   Per hospital dc was instructed to stop hydralazine   She takes oxycodone for chronic pains in her shoulder, legs. She is completely out and waiting for Rx to be approved from insurance. Paperwork has been complted.   She continues on iron supplement once daily.   Review of Systems:  Review of Systems  Constitutional:  Negative for chills, fever and weight loss.  HENT:  Negative for tinnitus.   Respiratory:  Negative for cough, sputum production and shortness of breath.   Cardiovascular:  Negative for chest pain, palpitations and leg swelling.  Gastrointestinal:  Negative for abdominal pain, constipation, diarrhea and heartburn.  Genitourinary:  Negative for dysuria, frequency and urgency.  Musculoskeletal:  Positive for joint pain and myalgias. Negative for back pain and falls.  Skin: Negative.   Neurological:  Positive for weakness. Negative for dizziness and headaches.  Psychiatric/Behavioral:  Negative for depression and memory loss. The patient does  not have insomnia.    Past Medical History:  Diagnosis Date   Acute respiratory failure ([place])    Anemia    previous blood transfusions   Arthritis    "all over"   Asthma    Atrial flutter (HCC)    Bradycardia    requiring previous d/c of BB and reduction of amiodarone   CAD (coronary  artery disease)    nonobstructive per notes   Chronic diastolic CHF (congestive heart failure) ([place])    CKD (chronic kidney disease), stage III ([place])    Complication of blood transfusion    "got the wrong blood type at [facility] in ~ 2015; no adverse reaction that we are aware of"/daughter, [name] (01/27/2016)   COPD (chronic obstructive pulmonary disease) ([place])    Depression    "light case"   DVT (deep venous thrombosis) ([place]) 01/2016   a. LLE DVT 01/2016 - switched from Eliquis to Coumadin.   Dyspnea    with some activity   Gastric stenosis    a. s/p stomach tube   GERD (gastroesophageal reflux disease)    Headache    History of 2019 novel coronavirus disease (COVID-19)    History of blood transfusion    "several" (01/27/2016)   History of stomach ulcers    Hyperlipidemia    Hypertension    Hypothyroidism    On home oxygen therapy    "2L; 9PM til 9AM" (06/27/2017)   PAD (peripheral artery disease) (HCC)    a. prior gangrene, toe amputations, intervention.   PAF (paroxysmal atrial fibrillation) ([place])    Paraesophageal hernia    Perforated gastric ulcer ([place])    Pneumonia    "a few times" (06/27/2017)   Seasonal allergies    SIADH (syndrome of inappropriate ADH production) ([place])    [name] 01/10/2015   Small bowel obstruction ([place])    "I don't know how many" (01/11/2015)   Stroke ([place])    several- no residual   Type II diabetes mellitus ([place])    "related to prednisone use  for > 20 yr; once predinose stopped; no more DM RX" (01/27/2016)   UTI (urinary tract infection) 02/08/2016   Ventral hernia with bowel obstruction    Past Surgical History:  Procedure Laterality Date   ABDOMINAL AORTOGRAM N/A 09/21/2016   Procedure: Abdominal Aortogram;  Surgeon: Waynetta Sandy, MD;  Location: [facility];  Service: Cardiovascular;  Laterality: N/A;   AMPUTATION Left 09/25/2016   Procedure: AMPUTATION DIGIT- LEFT 2ND AND 3RD TOES;  Surgeon: Rosetta Posner, MD;   Location: [facility];  Service: Vascular;  Laterality: Left;   AMPUTATION Right 05/03/2018   Procedure: AMPUTATION RIGHT GREAT TOE;  Surgeon: Rosetta Posner, MD;  [facility];  Service: Vascular;  Laterality: Right;   CATARACT EXTRACTION W/ INTRAOCULAR LENS  IMPLANT, BILATERAL     CHOLECYSTECTOMY OPEN     COLECTOMY     ESOPHAGOGASTRODUODENOSCOPY N/A 01/19/2014   Procedure: ESOPHAGOGASTRODUODENOSCOPY (EGD);  Surgeon: Irene Shipper, MD;  Location: [facility];  Service: Endoscopy;  Laterality: N/A;   ESOPHAGOGASTRODUODENOSCOPY N/A 01/20/2014   Procedure: ESOPHAGOGASTRODUODENOSCOPY (EGD);  Surgeon: Irene Shipper, MD;  Location: [facility];  Service: Endoscopy;  Laterality: N/A;   ESOPHAGOGASTRODUODENOSCOPY N/A 03/19/2014   Procedure: ESOPHAGOGASTRODUODENOSCOPY (EGD);  Surgeon: Milus Banister, MD;  Location: [facility];  Service: Endoscopy;  Laterality: N/A;   ESOPHAGOGASTRODUODENOSCOPY N/A 07/08/2015   Procedure: ESOPHAGOGASTRODUODENOSCOPY (EGD);  Surgeon: Doran Stabler, MD;  Location: [place];  Service:  Endoscopy;  Laterality: N/A;   ESOPHAGOGASTRODUODENOSCOPY (EGD) WITH PROPOFOL N/A 09/15/2015   Procedure: ESOPHAGOGASTRODUODENOSCOPY (EGD) WITH PROPOFOL;  Surgeon: Manus Gunning, MD;  [facility];  Service: Gastroenterology;  Laterality: N/A;   ESOPHAGOGASTRODUODENOSCOPY (EGD) WITH PROPOFOL N/A 10/12/2020   Procedure: ESOPHAGOGASTRODUODENOSCOPY (EGD) WITH PROPOFOL;  Surgeon: Gatha Mayer, MD;  Location: [place] [facility];  Service: Endoscopy;  Laterality: N/A;   GASTROJEJUNOSTOMY     hx/notes 01/10/2015   GASTROJEJUNOSTOMY N/A 09/23/2015   Procedure: OPEN GASTROJEJUNOSTOMY TUBE PLACEMENT;  Surgeon: Arta Bruce Kinsinger, MD;  [facility];  Service: General;  Laterality: N/A;   GLAUCOMA SURGERY Bilateral    IR CM INJ ANY COLONIC TUBE W/FLUORO  01/05/2017   IR CM INJ ANY COLONIC TUBE W/FLUORO  06/07/2017   IR CM INJ ANY COLONIC TUBE W/FLUORO  11/05/2017   IR CM INJ  ANY COLONIC TUBE W/FLUORO  09/18/2018   IR CM INJ ANY COLONIC TUBE W/FLUORO  03/06/2019   IR CM INJ ANY COLONIC TUBE W/FLUORO  03/11/2019   IR GASTR TUBE CONVERT GASTR-JEJ PER W/FL MOD SED  04/30/2020   IR GENERIC HISTORICAL  01/07/2016   IR GJ TUBE CHANGE 01/07/2016 Jacqulynn Cadet, MD [facility]   IR GENERIC HISTORICAL  01/27/2016   IR MECH REMOV OBSTRUC MAT ANY COLON TUBE W/FLUORO 01/27/2016 Markus Daft, MD [facility]   IR GENERIC HISTORICAL  02/07/2016   IR PATIENT EVAL TECH 0-60 MINS Darrell K Allred, PA-C [facility]   IR GENERIC HISTORICAL  02/08/2016   IR GJ TUBE CHANGE 02/08/2016 Greggory Keen, MD [facility]   IR GENERIC HISTORICAL  01/06/2016   IR GJ TUBE CHANGE 01/06/2016 [facility] OUT REF   IR GENERIC HISTORICAL  05/02/2016   IR CM INJ ANY COLONIC TUBE W/FLUORO 05/02/2016 Arne [place], MD [facility]   IR GENERIC HISTORICAL  05/15/2016   IR GJ TUBE CHANGE 05/15/2016 Sandi Mariscal, MD [facility]   IR GENERIC HISTORICAL  06/28/2016   IR GJ TUBE CHANGE 06/28/2016 [facility]   IR GJ TUBE CHANGE  02/20/2017   IR GJ TUBE CHANGE  05/10/2017   IR GJ TUBE CHANGE  07/06/2017   IR GJ TUBE CHANGE  08/02/2017   IR GJ TUBE CHANGE  10/15/2017   IR GJ TUBE CHANGE  12/26/2017   IR GJ TUBE CHANGE  04/08/2018   IR GJ TUBE CHANGE  06/19/2018   IR GJ TUBE CHANGE  03/03/2019   IR GJ TUBE CHANGE  06/06/2019   IR GJ TUBE CHANGE  09/26/2019   IR GJ TUBE CHANGE  01/09/2020   IR GJ TUBE CHANGE  05/12/2020   IR GJ TUBE CHANGE  10/05/2020   IR GJ TUBE CHANGE  10/14/2020   IR PATIENT EVAL TECH 0-60 MINS  10/19/2016   IR PATIENT EVAL TECH 0-60 MINS  12/25/2016   IR PATIENT EVAL TECH 0-60 MINS  01/29/2017   IR PATIENT EVAL TECH 0-60 MINS  04/04/2017   IR PATIENT EVAL TECH 0-60 MINS  04/30/2017   IR PATIENT EVAL TECH 0-60 MINS  07/31/2017   IR REPLC DUODEN/JEJUNO TUBE PERCUT W/FLUORO  11/14/2016   LAPAROTOMY N/A 01/20/2015   Procedure: EXPLORATORY LAPAROTOMY;  Surgeon: Coralie Keens,  MD;  Location: [facility];  Service: General;  Laterality: N/A;   LOWER EXTREMITY ANGIOGRAPHY Left 09/21/2016   Procedure: Lower Extremity Angiography;  Surgeon: Waynetta Sandy, MD;  Location: [facility];  Service: Cardiovascular;  Laterality: Left;   LOWER EXTREMITY ANGIOGRAPHY Right 06/27/2017  Procedure: Lower Extremity Angiography;  Surgeon: Waynetta Sandy, MD;  Location: [facility];  Service: Cardiovascular;  Laterality: Right;   LYSIS OF ADHESION N/A 01/20/2015   Procedure: LYSIS OF ADHESIONS < 1 HOUR;  Surgeon: Coralie Keens, MD;  Location: [facility];  Service: General;  Laterality: N/A;   PERIPHERAL VASCULAR BALLOON ANGIOPLASTY Left 09/21/2016   Procedure: Peripheral Vascular Balloon Angioplasty;  Surgeon: Waynetta Sandy, MD;  Location: [facility];  Service: Cardiovascular;  Laterality: Left;  drug coated balloon   PERIPHERAL VASCULAR BALLOON ANGIOPLASTY Right 06/27/2017   Procedure: PERIPHERAL VASCULAR BALLOON ANGIOPLASTY;  Surgeon: Waynetta Sandy, MD;  Location: [facility];  Service: Cardiovascular;  Laterality: Right;  SFA/TPTRUNK   TONSILLECTOMY     TRANSMETATARSAL AMPUTATION Right 11/14/2019   Procedure: TRANSMETATARSAL AMPUTATION RIGHT FOOT;  Surgeon: Edrick Kins, DPM;  Location: [facility];  Service: Podiatry;  Laterality: Right;   TUBAL LIGATION     VENTRAL HERNIA REPAIR  2015   incarcerated ventral hernia ([facility] 09/2013)/notes 01/10/2015   Social History:   reports that she has never smoked. She quit smokeless tobacco use about 40 years ago.  Her smokeless tobacco use included snuff. She reports that she does not drink alcohol and does not use drugs.  Family History  Problem Relation Age of Onset   Stroke Mother    Hypertension Mother    Diabetes Brother    Glaucoma Son    Glaucoma Son    Heart attack Neg Hx    Colon cancer Neg Hx    Stomach cancer Neg Hx     Medications: Patient's Medications  New  Prescriptions   No medications on file  Previous Medications   ACETAMINOPHEN (TYLENOL) 500 MG TABLET    Take 500 mg by mouth every 6 (six) hours as needed for moderate pain.   AMLODIPINE (NORVASC) 5 MG TABLET    Take 1 tablet (5 mg total) by mouth daily.   ATORVASTATIN (LIPITOR) 10 MG TABLET    TAKE 1 TABLET BY MOUTH EVERY DAY   B COMPLEX VITAMINS TABLET    Take 1 tablet by mouth daily.    CHOLECALCIFEROL (VITAMIN D3) 25 MCG (1000 UNIT) TABLET    Take 1,000 Units by mouth daily.   CYCLOSPORINE (RESTASIS) 0.05 % OPHTHALMIC EMULSION    USE 1 DROP INTO BOTH EYES TWICE DAILY   DULOXETINE (CYMBALTA) 30 MG CAPSULE    TAKE 1 CAPSULE BY MOUTH EVERY DAY   FERROUS SULFATE 220 (44 FE) MG/5ML SOLUTION    TAKE 7.4 MLS (325 MG TOTAL) BY MOUTH DAILY.   FLUTICASONE (FLONASE) 50 MCG/ACT NASAL SPRAY    SPRAY 2 SPRAYS INTO EACH NOSTRIL EVERY DAY   FUROSEMIDE (LASIX) 40 MG TABLET    TAKE 0.5-1 TABLETS (20-40 MG TOTAL) BY MOUTH SEE ADMIN INSTRUCTIONS. TAKE 20 MG DAILY, MAY INSTEAD TAKE 40 MG DAILY AS NEEDED FOR EDEMA   GABAPENTIN (NEURONTIN) 300 MG CAPSULE    Take 1 capsule (300 mg total) by mouth 2 (two) times daily.   GENTAMICIN CREAM (GARAMYCIN) 0.1 %    Apply 1 application topically daily as needed (dry skin).   HYDRALAZINE (APRESOLINE) 50 MG TABLET    TAKE 1 TABLET BY MOUTH THREE TIMES A DAY   INSULIN PEN NEEDLE (B-D UF III MINI PEN NEEDLES) 31G X 5 MM MISC    Use twice daily with giving insulin injections. Dx E11.9   IPRATROPIUM-ALBUTEROL (DUONEB) 0.5-2.5 (3) MG/3ML SOLN  Take 3 mLs by nebulization every 6 (six) hours as needed (shortness of breath).   ISOSORBIDE MONONITRATE (IMDUR) 30 MG 24 HR TABLET    TAKE 2 TABLETS BY MOUTH DAILY.   LATANOPROST (XALATAN) 0.005 % OPHTHALMIC SOLUTION    Place 1 drop into both eyes at bedtime.   LEVALBUTEROL (XOPENEX HFA) 45 MCG/ACT INHALER    Inhale 2 puffs into the lungs every 6 (six) hours as needed for wheezing.   LEVOTHYROXINE (SYNTHROID) 125 MCG TABLET    TAKE 1  TABLET BY MOUTH EVERY DAY   LORATADINE (CLARITIN) 10 MG TABLET    Take 10 mg by mouth daily.   MOMETASONE-FORMOTEROL (DULERA) 200-5 MCG/ACT AERO    INHALE TWO PUFFS BY MOUTH TWICE DAILY   MULTIPLE VITAMIN (MULTIVITAMIN WITH MINERALS) TABS TABLET    Take 1 tablet by mouth daily.   NUTRITIONAL SUPPLEMENTS (FEEDING SUPPLEMENT, OSMOLITE 1.5 CAL,) LIQD    Place 711 mLs into feeding tube See admin instructions. Take 711 ml over a 12 hour period per day 9 am-9 pm at a rate of 65   NYSTATIN (MYCOSTATIN) 100000 UNIT/ML SUSPENSION    Take 5 mLs (500,000 Units total) by mouth 4 (four) times daily.   NYSTATIN (MYCOSTATIN/NYSTOP) POWDER    Apply 1 application topically as needed (skin irritation).   NYSTATIN CREAM (MYCOSTATIN)    APPLY TO AFFECTED AREA TWICE A DAY   OXYCODONE (ROXICODONE) 5 MG IMMEDIATE RELEASE TABLET    Take 0.5-1 tablets (2.5-5 mg total) by mouth every 8 (eight) hours as needed for severe pain.   PANTOPRAZOLE (PROTONIX) 40 MG TABLET    Take 1 tablet (40 mg total) by mouth daily.   POLYETHYL GLYCOL-PROPYL GLYCOL (SYSTANE) 0.4-0.3 % GEL OPHTHALMIC GEL    Place 1 application into both eyes 2 (two) times daily as needed (moisture).   PROBIOTIC PRODUCT (EQL PROBIOTIC COLON SUPPORT PO)    Take 1 capsule by mouth daily.   SENNA-DOCUSATE (SENOKOT-S) 8.6-50 MG TABLET    Take 1 tablet by mouth as needed for mild constipation.   SHARK LIVER OIL-COCOA BUTTER (PREPARATION H) 0.25-3-85.5 % SUPPOSITORY    Place 1 suppository rectally as needed for hemorrhoids.   SUCRALFATE (CARAFATE) 1 G TABLET    Take 1 tablet (1 g total) by mouth 4 (four) times daily -  with meals and at bedtime.   WATER FOR IRRIGATION, STERILE (FREE WATER) SOLN    Place 180 mLs into feeding tube 3 (three) times daily.   WHITE PETROLATUM-MINERAL OIL (SYSTANE NIGHTTIME) OINT    Place 1 application into both eyes at bedtime.   Modified Medications   No medications on file  Discontinued Medications   LANTUS SOLOSTAR 100 UNIT/ML SOLOSTAR  PEN    INJECT 8 UNITS INTO THE SKIN AT BEDTIME.    Physical Exam:  Vitals:   10/15/20 1403  BP: 140/70  Pulse: (!) 51  Temp: 97.7 F (36.5 C)  TempSrc: Temporal  SpO2: (!) 82%  Weight: 125 lb 3.2 oz (56.8 kg)  Height: 5\' 6"  (1.676 m)   Body mass index is 20.21 kg/m. Wt Readings from Last 3 Encounters:  10/15/20 125 lb 3.2 oz (56.8 kg)  10/13/20 119 lb 14.7 oz (54.4 kg)  10/10/20 123 lb (55.8 kg)    Physical Exam Constitutional:      General: She is not in acute distress.    Appearance: She is well-developed. She is not diaphoretic.  HENT:     Head: Normocephalic and atraumatic.  Mouth/Throat:     Pharynx: No oropharyngeal exudate.  Eyes:     Conjunctiva/sclera: Conjunctivae normal.     Pupils: Pupils are equal, round, and reactive to light.  Cardiovascular:     Rate and Rhythm: Normal rate and regular rhythm.     Heart sounds: Normal heart sounds.  Pulmonary:     Effort: Pulmonary effort is normal.     Breath sounds: Normal breath sounds.  Abdominal:     General: Bowel sounds are normal.     Palpations: Abdomen is soft.  Musculoskeletal:     Cervical back: Normal range of motion and neck supple.     Right lower leg: No edema.     Left lower leg: No edema.  Skin:    General: Skin is warm and dry.  Neurological:     Mental Status: She is alert.  Psychiatric:        Mood and Affect: Mood normal.    Labs reviewed: Basic Metabolic Panel: Recent Labs    11/15/19 0506 04/22/20 1232 10/11/20 2337 10/13/20 0324 10/14/20 0322  NA 134*   < > 133* 133* 134*  K 4.3   < > 4.0 3.6 3.3*  CL 100   < > 99 103 103  CO2 24   < > 27 26 26   GLUCOSE 162*   < > 117* 69* 60*  BUN 37*   < > 57* 35* 26*  CREATININE 1.37*   < > 1.45* 1.27* 1.27*  CALCIUM 9.3   < > 8.9 8.7* 8.6*  MG 2.1  --   --   --   --   PHOS 4.4  --   --   --   --    < > = values in this interval not displayed.   Liver Function Tests: Recent Labs    10/11/20 2337 10/13/20 0324 10/14/20 0322   AST 23 22 23   ALT 18 15 15   ALKPHOS 66 59 66  BILITOT 1.0 1.2 0.6  PROT 6.9 6.1* 6.0*  ALBUMIN 2.9* 2.5* 2.4*   Recent Labs    04/22/20 1232  LIPASE 25   No results for input(s): AMMONIA in the last 8760 hours. CBC: Recent Labs    10/10/20 2224 10/11/20 1740 10/11/20 2337 10/12/20 0957 10/12/20 1524 10/12/20 1902 10/13/20 0324  WBC 4.9  --  6.2  --   --  6.9 4.8  NEUTROABS 2.7  --  4.4  --   --   --  3.1  HGB 6.9*   < > 10.6*  10.7*   < > 10.2* 10.1* 9.5*  HCT 21.2*   < > 31.2*  31.7*   < > 31.1* 29.8* 28.5*  MCV 97.2  --  83.6  --   --  84.4 84.6  PLT 226  --  195  --   --  203 204   < > = values in this interval not displayed.   Lipid Panel: No results for input(s): CHOL, HDL, LDLCALC, TRIG, CHOLHDL, LDLDIRECT in the last 8760 hours. TSH: No results for input(s): TSH in the last 8760 hours. A1C: Lab Results  Component Value Date   HGBA1C 6.2 (H) 09/23/2020     Assessment/Plan 1. Persistent atrial fibrillation (HCC) -rate controlled, off coumadin at this time.  - COMPLETE METABOLIC PANEL WITH GFR - Protime-INR  2. Anemia due to blood loss Continues to have some dark stools. Will follow up - CBC with Differential/Platelet - COMPLETE METABOLIC PANEL WITH GFR  3. Essential hypertension, benign Controlled on current regimen.   4. Diabetic ulcer of toe of left foot associated with type 2 diabetes mellitus, with necrosis of muscle ([place]) Ongoing follow up by podiatry and vascular. Plan to stent left leg to avoid amputation of toe.   5. Diabetic polyneuropathy associated with type 2 diabetes mellitus ([place]) -ongoing, stable on cymbalta.  6. Type 2 diabetes mellitus with peripheral neuropathy (HCC) -will stop all insulin at this time. Using novolog and lantus PRN. A1c at goal.   7. PEG (percutaneous endoscopic gastrostomy) status ([place]) Recently exchanged but needs to be replaced due to not having her exact tube in stock. This has been scheduled.   8.  Depression, major, single episode, complete remission ([place]) Stable on cymbalta.   9. Gastrointestinal hemorrhage with melena -continues on carafate. Some dark stools but overall improved. Will follow up cbc today to ensure stable hgb.  - pantoprazole (PROTONIX) 40 MG tablet; Take 1 tablet (40 mg total) by mouth 2 (two) times daily.  Dispense: 180 tablet; Refill: 2   Next appt: 11/04/2020 [name]. [place], [facility] [phone]

## 2020-10-15 NOTE — Patient Outreach (Signed)
[facility]) Care Management  10/15/2020  Shannon [name] 04-15-1923 [id number]   Notified member discharged from hospital (admitted 7/10-7/14 for GI bleed and G-tube complications).  Call placed to member, no answer, unable to leave voice message.  Will await call back.  She is scheduled for vascular procedure on 7/20, if no call back will follow up after procedure.  Will inform hospital liaison of pending admission.     Update:    Incoming call received back from member.  State she is not as strong as she normally is, but is working hard to feel better.  Confirms follow up appointments and vascular procedure for next week. Denies any urgent concerns, encouraged to contact this care manager with questions.  Agrees to follow up post hospital discharge next week.   Goals Addressed             This Visit's Progress    [name] - Make and Keep All Appointments   On track      Timeframe:  Short-Term Goal Priority:  Medium Start Date:       5/4           Expected End Date:     7/29  Barriers: Knowledge                  - ask family or friend for a ride - call to cancel if needed - keep a calendar with appointment dates    Why is this important?   Part of staying healthy is seeing the doctor for follow-up care.  If you forget your appointments, there are some things you can do to stay on track.    Notes:   11/22 - Upcoming appointments reviewed with member  12/21 - reminded of follow up with podiatrist  2/7 - Reviewed upcoming appointments with member, PCP on 3/9, podiatrist on 3/16  4/6 - Encouraged to follow up with GI regarding feeding tube and test results  5/4 - Continues to have issues with feet, particularly left foot bunion.  Appointment with podiatrist on 5/18, will continue to use cream and contact provider if in need of sooner visit  6/7 - follow up with podiatrist tomorrow and 6/22.  Still has hardened area on right foot and cellulitis on left  leg/foot.  Appointment with PCP on 7/11  6/29 - attended 6/22 podiatry appointment, will need another surgery.  PCP appointment 6/23 clearing her for surgery.  Awaiting call from vascular specialist for appointment prior to surgery date   7/15 - Was seen by vascular surgeon on 7/5, will have intervention next week.  Hospital follow up with PCP later today.     [name] - Perform Foot Care   On track    Barriers: Knowledge   Timeframe:  Long-Range Goal Priority:  High Start Date:       6/7              Expected End Date:    9/7          - check feet daily for cuts, sores or redness - wash and dry feet carefully every day - wear comfortable, cotton socks - wear comfortable, well-fitting shoes    Why is this important?   Good foot care is very important when you have diabetes.  There are many things you can do to keep your feet healthy and catch a problem early.    Notes:   11/22 - Reviewed updated wound care per most recent  MD visit  12/21 - Discussed ongoing wound care with member  2/7 - Reminded of wound care per podiatrist, encouraged to keep wound clean and dry  6/7 - Seen in ED for cellulitis of left foot/leg, completed course of antibiotics, still has swelling.    6/29 - Patient is now needing transmetatarsal removal of the left foot due to non-healing wound and infection.  Surgery scheduled for 7/8  7/15 - Amputation surgery has been postponed, will now have vascular intervention, scheduled for 7/20.  Member denies questions regarding procedure         Kemper Durie, RN, MSN [facility] Care Management  [facility] Manager [phone]

## 2020-10-16 LAB — COMPLETE METABOLIC PANEL WITH GFR
AG Ratio: 1 (calc) (ref 1.0–2.5)
ALT: 16 U/L (ref 6–29)
AST: 28 U/L (ref 10–35)
Albumin: 3.4 g/dL — ABNORMAL LOW (ref 3.6–5.1)
Alkaline phosphatase (APISO): 79 U/L (ref 37–153)
BUN/Creatinine Ratio: 22 (calc) (ref 6–22)
BUN: 40 mg/dL — ABNORMAL HIGH (ref 7–25)
CO2: 26 mmol/L (ref 20–32)
Calcium: 9.4 mg/dL (ref 8.6–10.4)
Chloride: 99 mmol/L (ref 98–110)
Creat: 1.78 mg/dL — ABNORMAL HIGH (ref 0.60–0.95)
Globulin: 3.4 g/dL (calc) (ref 1.9–3.7)
Glucose, Bld: 102 mg/dL (ref 65–139)
Potassium: 3.8 mmol/L (ref 3.5–5.3)
Sodium: 133 mmol/L — ABNORMAL LOW (ref 135–146)
Total Bilirubin: 0.5 mg/dL (ref 0.2–1.2)
Total Protein: 6.8 g/dL (ref 6.1–8.1)
eGFR: 26 mL/min/{1.73_m2} — ABNORMAL LOW (ref >=60)

## 2020-10-16 LAB — CBC WITH DIFFERENTIAL/PLATELET
Absolute Monocytes: 718 cells/uL (ref 200–950)
Basophils Absolute: 21 cells/uL (ref 0–200)
Basophils Relative: 0.3 %
Eosinophils Absolute: 228 cells/uL (ref 15–500)
Eosinophils Relative: 3.3 %
HCT: 30 % — ABNORMAL LOW (ref 35.0–45.0)
Hemoglobin: 9.8 g/dL — ABNORMAL LOW (ref 11.7–15.5)
Lymphs Abs: 911 cells/uL (ref 850–3900)
MCH: 28.7 pg (ref 27.0–33.0)
MCHC: 32.7 g/dL (ref 32.0–36.0)
MCV: 87.7 fL (ref 80.0–100.0)
MPV: 10.5 fL (ref 7.5–12.5)
Monocytes Relative: 10.4 %
Neutro Abs: 5023 cells/uL (ref 1500–7800)
Neutrophils Relative %: 72.8 %
Platelets: 250 10*3/uL (ref 140–400)
RBC: 3.42 10*6/uL — ABNORMAL LOW (ref 3.80–5.10)
RDW: 19.8 % — ABNORMAL HIGH (ref 11.0–15.0)
Total Lymphocyte: 13.2 %
WBC: 6.9 10*3/uL (ref 3.8–10.8)

## 2020-10-16 LAB — PROTIME-INR
INR: 1
Prothrombin Time: 10.6 s (ref 9.0–11.5)

## 2020-10-19 ENCOUNTER — Ambulatory Visit (HOSPITAL_COMMUNITY)
Admission: RE | Admit: 2020-10-19 | Discharge: 2020-10-19 | Disposition: A | Discharge status: Home / Self Care | Payer: Medicare Other | Source: Ambulatory Visit | Attending: Radiology | Admitting: Radiology

## 2020-10-19 ENCOUNTER — Other Ambulatory Visit: Payer: Self-pay

## 2020-10-19 DIAGNOSIS — K9413 Enterostomy malfunction: Secondary | ICD-10-CM | POA: Diagnosis not present

## 2020-10-19 DIAGNOSIS — Z431 Encounter for attention to gastrostomy: Secondary | ICD-10-CM | POA: Insufficient documentation

## 2020-10-19 DIAGNOSIS — R633 Feeding difficulties, unspecified: Secondary | ICD-10-CM | POA: Insufficient documentation

## 2020-10-19 HISTORY — PX: IR GJ TUBE CHANGE: IMG1440

## 2020-10-19 MED ORDER — LIDOCAINE VISCOUS HCL 2 % MT SOLN
OROMUCOSAL | Status: AC
  Filled 2020-10-19: qty 15

## 2020-10-19 MED ORDER — IOHEXOL 300 MG/ML  SOLN
15.0000 mL | Freq: Once | INTRAMUSCULAR | Status: AC | PRN
  Administered 2020-10-19: 35 mL

## 2020-10-19 NOTE — Telephone Encounter (Signed)
Received fax from [facility] stating that the prior authorization for Oxycodone was APPROVED 09/15/2020-04/13/2021

## 2020-10-20 ENCOUNTER — Ambulatory Visit (HOSPITAL_COMMUNITY): Payer: Medicare Other | Admitting: Anesthesiology

## 2020-10-20 ENCOUNTER — Other Ambulatory Visit (HOSPITAL_COMMUNITY): Payer: Self-pay

## 2020-10-20 ENCOUNTER — Encounter (HOSPITAL_COMMUNITY): Payer: Self-pay | Admitting: Vascular Surgery

## 2020-10-20 ENCOUNTER — Telehealth: Payer: Self-pay | Admitting: Student

## 2020-10-20 ENCOUNTER — Inpatient Hospital Stay (HOSPITAL_COMMUNITY)
Admission: RE | Admit: 2020-10-20 | Discharge: 2020-10-22 | DRG: 253 | Disposition: A | Discharge status: Home / Self Care | Payer: Medicare Other | Attending: Vascular Surgery | Admitting: Vascular Surgery

## 2020-10-20 ENCOUNTER — Encounter (HOSPITAL_COMMUNITY)
Admission: RE | Disposition: A | Discharge status: Home / Self Care | Payer: Self-pay | Source: Home / Self Care | Attending: Vascular Surgery

## 2020-10-20 ENCOUNTER — Other Ambulatory Visit: Payer: Self-pay

## 2020-10-20 DIAGNOSIS — I9581 Postprocedural hypotension: Secondary | ICD-10-CM | POA: Diagnosis not present

## 2020-10-20 DIAGNOSIS — Z8616 Personal history of COVID-19: Secondary | ICD-10-CM | POA: Diagnosis not present

## 2020-10-20 DIAGNOSIS — E11621 Type 2 diabetes mellitus with foot ulcer: Secondary | ICD-10-CM | POA: Diagnosis not present

## 2020-10-20 DIAGNOSIS — E039 Hypothyroidism, unspecified: Secondary | ICD-10-CM | POA: Diagnosis not present

## 2020-10-20 DIAGNOSIS — Y838 Other surgical procedures as the cause of abnormal reaction of the patient, or of later complication, without mention of misadventure at the time of the procedure: Secondary | ICD-10-CM | POA: Diagnosis not present

## 2020-10-20 DIAGNOSIS — E1122 Type 2 diabetes mellitus with diabetic chronic kidney disease: Secondary | ICD-10-CM | POA: Diagnosis not present

## 2020-10-20 DIAGNOSIS — L97519 Non-pressure chronic ulcer of other part of right foot with unspecified severity: Secondary | ICD-10-CM | POA: Diagnosis present

## 2020-10-20 DIAGNOSIS — N183 Chronic kidney disease, stage 3 unspecified: Secondary | ICD-10-CM | POA: Diagnosis present

## 2020-10-20 DIAGNOSIS — I97638 Postprocedural hematoma of a circulatory system organ or structure following other circulatory system procedure: Secondary | ICD-10-CM | POA: Diagnosis not present

## 2020-10-20 DIAGNOSIS — I130 Hypertensive heart and chronic kidney disease with heart failure and stage 1 through stage 4 chronic kidney disease, or unspecified chronic kidney disease: Secondary | ICD-10-CM | POA: Diagnosis present

## 2020-10-20 DIAGNOSIS — M1990 Unspecified osteoarthritis, unspecified site: Secondary | ICD-10-CM | POA: Diagnosis present

## 2020-10-20 DIAGNOSIS — K449 Diaphragmatic hernia without obstruction or gangrene: Secondary | ICD-10-CM | POA: Diagnosis present

## 2020-10-20 DIAGNOSIS — Z8673 Personal history of transient ischemic attack (TIA), and cerebral infarction without residual deficits: Secondary | ICD-10-CM | POA: Diagnosis not present

## 2020-10-20 DIAGNOSIS — I5032 Chronic diastolic (congestive) heart failure: Secondary | ICD-10-CM | POA: Diagnosis present

## 2020-10-20 DIAGNOSIS — E785 Hyperlipidemia, unspecified: Secondary | ICD-10-CM | POA: Diagnosis present

## 2020-10-20 DIAGNOSIS — I739 Peripheral vascular disease, unspecified: Secondary | ICD-10-CM | POA: Diagnosis present

## 2020-10-20 DIAGNOSIS — Z934 Other artificial openings of gastrointestinal tract status: Secondary | ICD-10-CM | POA: Diagnosis not present

## 2020-10-20 DIAGNOSIS — D62 Acute posthemorrhagic anemia: Secondary | ICD-10-CM | POA: Diagnosis not present

## 2020-10-20 DIAGNOSIS — I952 Hypotension due to drugs: Secondary | ICD-10-CM | POA: Diagnosis not present

## 2020-10-20 DIAGNOSIS — F32A Depression, unspecified: Secondary | ICD-10-CM | POA: Diagnosis present

## 2020-10-20 DIAGNOSIS — I480 Paroxysmal atrial fibrillation: Secondary | ICD-10-CM | POA: Diagnosis present

## 2020-10-20 DIAGNOSIS — Z681 Body mass index (BMI) 19 or less, adult: Secondary | ICD-10-CM

## 2020-10-20 DIAGNOSIS — E1151 Type 2 diabetes mellitus with diabetic peripheral angiopathy without gangrene: Secondary | ICD-10-CM | POA: Diagnosis not present

## 2020-10-20 DIAGNOSIS — I70245 Atherosclerosis of native arteries of left leg with ulceration of other part of foot: Secondary | ICD-10-CM | POA: Diagnosis present

## 2020-10-20 DIAGNOSIS — Z8711 Personal history of peptic ulcer disease: Secondary | ICD-10-CM | POA: Diagnosis not present

## 2020-10-20 DIAGNOSIS — I2510 Atherosclerotic heart disease of native coronary artery without angina pectoris: Secondary | ICD-10-CM | POA: Diagnosis present

## 2020-10-20 DIAGNOSIS — Z20822 Contact with and (suspected) exposure to covid-19: Secondary | ICD-10-CM | POA: Diagnosis not present

## 2020-10-20 DIAGNOSIS — T85598A Other mechanical complication of other gastrointestinal prosthetic devices, implants and grafts, initial encounter: Secondary | ICD-10-CM | POA: Diagnosis not present

## 2020-10-20 DIAGNOSIS — Z833 Family history of diabetes mellitus: Secondary | ICD-10-CM

## 2020-10-20 DIAGNOSIS — Z880 Allergy status to penicillin: Secondary | ICD-10-CM

## 2020-10-20 DIAGNOSIS — Z8249 Family history of ischemic heart disease and other diseases of the circulatory system: Secondary | ICD-10-CM

## 2020-10-20 DIAGNOSIS — Z823 Family history of stroke: Secondary | ICD-10-CM

## 2020-10-20 DIAGNOSIS — I4819 Other persistent atrial fibrillation: Secondary | ICD-10-CM | POA: Diagnosis not present

## 2020-10-20 DIAGNOSIS — J449 Chronic obstructive pulmonary disease, unspecified: Secondary | ICD-10-CM | POA: Diagnosis present

## 2020-10-20 DIAGNOSIS — I70235 Atherosclerosis of native arteries of right leg with ulceration of other part of foot: Secondary | ICD-10-CM | POA: Diagnosis not present

## 2020-10-20 DIAGNOSIS — L97529 Non-pressure chronic ulcer of other part of left foot with unspecified severity: Secondary | ICD-10-CM | POA: Diagnosis present

## 2020-10-20 DIAGNOSIS — I97618 Postprocedural hemorrhage and hematoma of a circulatory system organ or structure following other circulatory system procedure: Secondary | ICD-10-CM | POA: Diagnosis not present

## 2020-10-20 DIAGNOSIS — Z86718 Personal history of other venous thrombosis and embolism: Secondary | ICD-10-CM

## 2020-10-20 DIAGNOSIS — Z89421 Acquired absence of other right toe(s): Secondary | ICD-10-CM

## 2020-10-20 DIAGNOSIS — K311 Adult hypertrophic pyloric stenosis: Secondary | ICD-10-CM | POA: Diagnosis present

## 2020-10-20 DIAGNOSIS — J302 Other seasonal allergic rhinitis: Secondary | ICD-10-CM | POA: Diagnosis not present

## 2020-10-20 DIAGNOSIS — S301XXA Contusion of abdominal wall, initial encounter: Secondary | ICD-10-CM | POA: Diagnosis present

## 2020-10-20 DIAGNOSIS — K219 Gastro-esophageal reflux disease without esophagitis: Secondary | ICD-10-CM | POA: Diagnosis present

## 2020-10-20 HISTORY — PX: PERIPHERAL VASCULAR INTERVENTION: CATH118257

## 2020-10-20 HISTORY — PX: ABDOMINAL AORTOGRAM W/LOWER EXTREMITY: CATH118223

## 2020-10-20 HISTORY — PX: HEMATOMA EVACUATION: SHX5118

## 2020-10-20 HISTORY — PX: WOUND EXPLORATION: SHX6188

## 2020-10-20 LAB — BASIC METABOLIC PANEL
Anion gap: 4 — ABNORMAL LOW (ref 5–15)
BUN: 44 mg/dL — ABNORMAL HIGH (ref 8–23)
CO2: 28 mmol/L (ref 22–32)
Calcium: 8.7 mg/dL — ABNORMAL LOW (ref 8.9–10.3)
Chloride: 104 mmol/L (ref 98–111)
Creatinine, Ser: 1.47 mg/dL — ABNORMAL HIGH (ref 0.44–1.00)
GFR, Estimated: 33 mL/min — ABNORMAL LOW (ref >60)
Glucose, Bld: 128 mg/dL — ABNORMAL HIGH (ref 70–99)
Potassium: 4.2 mmol/L (ref 3.5–5.1)
Sodium: 136 mmol/L (ref 135–145)

## 2020-10-20 LAB — GLUCOSE, CAPILLARY
Glucose-Capillary: 123 mg/dL — ABNORMAL HIGH (ref 70–99)
Glucose-Capillary: 132 mg/dL — ABNORMAL HIGH (ref 70–99)
Glucose-Capillary: 140 mg/dL — ABNORMAL HIGH (ref 70–99)

## 2020-10-20 LAB — POCT I-STAT 7, (LYTES, BLD GAS, ICA,H+H)
Acid-Base Excess: 4 mmol/L — ABNORMAL HIGH (ref 0.0–2.0)
Bicarbonate: 29 mmol/L — ABNORMAL HIGH (ref 20.0–28.0)
Calcium, Ion: 1.19 mmol/L (ref 1.15–1.40)
HCT: 25 % — ABNORMAL LOW (ref 36.0–46.0)
Hemoglobin: 8.5 g/dL — ABNORMAL LOW (ref 12.0–15.0)
O2 Saturation: 100 %
Potassium: 4.4 mmol/L (ref 3.5–5.1)
Sodium: 137 mmol/L (ref 135–145)
TCO2: 30 mmol/L (ref 22–32)
pCO2 arterial: 46.5 mmHg (ref 32.0–48.0)
pH, Arterial: 7.404 (ref 7.350–7.450)
pO2, Arterial: 498 mmHg — ABNORMAL HIGH (ref 83.0–108.0)

## 2020-10-20 LAB — TROPONIN I (HIGH SENSITIVITY): Troponin I (High Sensitivity): 23 ng/L — ABNORMAL HIGH (ref <18)

## 2020-10-20 LAB — CBC
HCT: 22.5 % — ABNORMAL LOW (ref 36.0–46.0)
Hemoglobin: 7.1 g/dL — ABNORMAL LOW (ref 12.0–15.0)
MCH: 28.2 pg (ref 26.0–34.0)
MCHC: 31.6 g/dL (ref 30.0–36.0)
MCV: 89.3 fL (ref 80.0–100.0)
Platelets: 199 10*3/uL (ref 150–400)
RBC: 2.52 MIL/uL — ABNORMAL LOW (ref 3.87–5.11)
RDW: 19.9 % — ABNORMAL HIGH (ref 11.5–15.5)
WBC: 6.7 10*3/uL (ref 4.0–10.5)
nRBC: 0 % (ref 0.0–0.2)

## 2020-10-20 LAB — POCT I-STAT, CHEM 8
BUN: 50 mg/dL — ABNORMAL HIGH (ref 8–23)
Calcium, Ion: 1.28 mmol/L (ref 1.15–1.40)
Chloride: 99 mmol/L (ref 98–111)
Creatinine, Ser: 1.8 mg/dL — ABNORMAL HIGH (ref 0.44–1.00)
Glucose, Bld: 114 mg/dL — ABNORMAL HIGH (ref 70–99)
HCT: 28 % — ABNORMAL LOW (ref 36.0–46.0)
Hemoglobin: 9.5 g/dL — ABNORMAL LOW (ref 12.0–15.0)
Potassium: 4.9 mmol/L (ref 3.5–5.1)
Sodium: 137 mmol/L (ref 135–145)
TCO2: 28 mmol/L (ref 22–32)

## 2020-10-20 LAB — PREPARE RBC (CROSSMATCH)

## 2020-10-20 SURGERY — ABDOMINAL AORTOGRAM W/LOWER EXTREMITY
Anesthesia: LOCAL

## 2020-10-20 SURGERY — WOUND EXPLORATION
Anesthesia: General | Site: Groin | Laterality: Right

## 2020-10-20 MED ORDER — CLOPIDOGREL BISULFATE 75 MG PO TABS
75.0000 mg | ORAL_TABLET | Freq: Every day | ORAL | Status: DC
  Administered 2020-10-21 – 2020-10-22 (×2): 75 mg via ORAL
  Filled 2020-10-20 (×2): qty 1

## 2020-10-20 MED ORDER — PROTAMINE SULFATE 10 MG/ML IV SOLN
INTRAVENOUS | Status: DC | PRN
  Administered 2020-10-20: 50 mg via INTRAVENOUS

## 2020-10-20 MED ORDER — FENTANYL CITRATE (PF) 250 MCG/5ML IJ SOLN
INTRAMUSCULAR | Status: DC | PRN
  Administered 2020-10-20: 50 ug via INTRAVENOUS

## 2020-10-20 MED ORDER — SODIUM CHLORIDE 0.9 % IV SOLN
INTRAVENOUS | Status: DC | PRN
Start: 2020-10-20 — End: 2020-10-20

## 2020-10-20 MED ORDER — SODIUM CHLORIDE 0.9 % IV SOLN: 250.0000 mL | INTRAVENOUS | Status: DC | PRN

## 2020-10-20 MED ORDER — FENTANYL CITRATE (PF) 100 MCG/2ML IJ SOLN
INTRAMUSCULAR | Status: AC
  Filled 2020-10-20: qty 2

## 2020-10-20 MED ORDER — HEPARIN SODIUM (PORCINE) 1000 UNIT/ML IJ SOLN
INTRAMUSCULAR | Status: DC | PRN
  Administered 2020-10-20: 5000 [IU] via INTRAVENOUS

## 2020-10-20 MED ORDER — FENTANYL CITRATE (PF) 100 MCG/2ML IJ SOLN
25.0000 ug | INTRAMUSCULAR | Status: DC | PRN
  Administered 2020-10-20 (×3): 25 ug via INTRAVENOUS

## 2020-10-20 MED ORDER — HEPARIN (PORCINE) IN NACL 1000-0.9 UT/500ML-% IV SOLN
INTRAVENOUS | Status: AC
  Filled 2020-10-20: qty 500

## 2020-10-20 MED ORDER — ASPIRIN 81 MG PO TBEC
81.0000 mg | DELAYED_RELEASE_TABLET | Freq: Every day | ORAL | 2 refills | Status: DC
  Filled 2020-10-20: qty 150, 150d supply, fill #0

## 2020-10-20 MED ORDER — SODIUM CHLORIDE 0.9% IV SOLUTION: Freq: Once | INTRAVENOUS | Status: DC

## 2020-10-20 MED ORDER — PROPOFOL 10 MG/ML IV BOLUS
INTRAVENOUS | Status: AC
  Filled 2020-10-20: qty 20

## 2020-10-20 MED ORDER — EPHEDRINE SULFATE-NACL 50-0.9 MG/10ML-% IV SOSY
PREFILLED_SYRINGE | INTRAVENOUS | Status: DC | PRN
  Administered 2020-10-20 (×2): 10 mg via INTRAVENOUS

## 2020-10-20 MED ORDER — PANTOPRAZOLE SODIUM 40 MG PO PACK
40.0000 mg | PACK | Freq: Two times a day (BID) | ORAL | Status: DC
  Administered 2020-10-20 – 2020-10-22 (×4): 40 mg
  Filled 2020-10-20 (×5): qty 20

## 2020-10-20 MED ORDER — LABETALOL HCL 5 MG/ML IV SOLN
10.0000 mg | INTRAVENOUS | Status: DC | PRN
Start: 2020-10-20 — End: 2020-10-22
  Administered 2020-10-20 (×2): 10 mg via INTRAVENOUS

## 2020-10-20 MED ORDER — LIDOCAINE HCL (PF) 1 % IJ SOLN
INTRAMUSCULAR | Status: AC
  Filled 2020-10-20: qty 30

## 2020-10-20 MED ORDER — ONDANSETRON HCL 4 MG/2ML IJ SOLN: 4.0000 mg | Freq: Four times a day (QID) | INTRAMUSCULAR | Status: DC | PRN

## 2020-10-20 MED ORDER — CLOPIDOGREL BISULFATE 75 MG PO TABS
75.0000 mg | ORAL_TABLET | Freq: Every day | ORAL | 3 refills | Status: DC
  Filled 2020-10-20: qty 90, 90d supply, fill #0

## 2020-10-20 MED ORDER — CLINDAMYCIN PHOSPHATE 900 MG/50ML IV SOLN
INTRAVENOUS | Status: DC | PRN
  Administered 2020-10-20: 900 mg via INTRAVENOUS

## 2020-10-20 MED ORDER — GABAPENTIN 250 MG/5ML PO SOLN
300.0000 mg | Freq: Two times a day (BID) | ORAL | Status: DC
  Administered 2020-10-20 – 2020-10-22 (×4): 300 mg
  Filled 2020-10-20 (×5): qty 6

## 2020-10-20 MED ORDER — LACTATED RINGERS IV SOLN
INTRAVENOUS | Status: DC | PRN
Start: 2020-10-20 — End: 2020-10-20

## 2020-10-20 MED ORDER — SODIUM CHLORIDE 0.9% FLUSH: 3.0000 mL | INTRAVENOUS | Status: DC | PRN

## 2020-10-20 MED ORDER — SODIUM CHLORIDE 0.9 % IV SOLN: INTRAVENOUS | Status: AC

## 2020-10-20 MED ORDER — SODIUM CHLORIDE 0.9% FLUSH
3.0000 mL | Freq: Two times a day (BID) | INTRAVENOUS | Status: DC
  Administered 2020-10-21 – 2020-10-22 (×3): 3 mL via INTRAVENOUS

## 2020-10-20 MED ORDER — ACETAMINOPHEN 160 MG/5ML PO SOLN: 650.0000 mg | ORAL | Status: DC | PRN

## 2020-10-20 MED ORDER — ONDANSETRON HCL 4 MG/2ML IJ SOLN
INTRAMUSCULAR | Status: DC | PRN
  Administered 2020-10-20: 4 mg via INTRAVENOUS

## 2020-10-20 MED ORDER — ONDANSETRON HCL 4 MG/2ML IJ SOLN: 4.0000 mg | Freq: Once | INTRAMUSCULAR | Status: DC | PRN

## 2020-10-20 MED ORDER — OSMOLITE 1.2 CAL PO LIQD
1000.0000 mL | ORAL | Status: DC
  Administered 2020-10-20: 1000 mL
  Filled 2020-10-20 (×2): qty 1000

## 2020-10-20 MED ORDER — LEVOTHYROXINE SODIUM 125 MCG PO TABS
125.0000 ug | ORAL_TABLET | Freq: Every day | ORAL | Status: DC
  Administered 2020-10-21 – 2020-10-22 (×2): 125 ug
  Filled 2020-10-20 (×3): qty 1

## 2020-10-20 MED ORDER — LABETALOL HCL 5 MG/ML IV SOLN
INTRAVENOUS | Status: AC
  Filled 2020-10-20: qty 4

## 2020-10-20 MED ORDER — GLYCOPYRROLATE 0.2 MG/ML IJ SOLN
INTRAMUSCULAR | Status: DC | PRN
  Administered 2020-10-20: .2 mg via INTRAVENOUS

## 2020-10-20 MED ORDER — HEPARIN 6000 UNIT IRRIGATION SOLUTION
Status: DC | PRN
  Administered 2020-10-20: 1

## 2020-10-20 MED ORDER — LIDOCAINE HCL (PF) 1 % IJ SOLN
INTRAMUSCULAR | Status: DC | PRN
  Administered 2020-10-20: 15 mL via INTRADERMAL

## 2020-10-20 MED ORDER — SUCCINYLCHOLINE CHLORIDE 20 MG/ML IJ SOLN
INTRAMUSCULAR | Status: DC | PRN
  Administered 2020-10-20: 100 mg via INTRAVENOUS

## 2020-10-20 MED ORDER — ACETAMINOPHEN 325 MG PO TABS
650.0000 mg | ORAL_TABLET | ORAL | Status: DC | PRN
  Administered 2020-10-20: 650 mg via ORAL
  Filled 2020-10-20: qty 2

## 2020-10-20 MED ORDER — HYDRALAZINE HCL 20 MG/ML IJ SOLN: 5.0000 mg | INTRAMUSCULAR | Status: DC | PRN

## 2020-10-20 MED ORDER — ALBUMIN HUMAN 5 % IV SOLN: INTRAVENOUS | Status: DC | PRN

## 2020-10-20 MED ORDER — OXYCODONE HCL 5 MG/5ML PO SOLN: 5.0000 mg | Freq: Once | ORAL | Status: DC | PRN

## 2020-10-20 MED ORDER — CHLORHEXIDINE GLUCONATE CLOTH 2 % EX PADS
6.0000 | MEDICATED_PAD | Freq: Every day | CUTANEOUS | Status: DC
  Administered 2020-10-20 – 2020-10-22 (×3): 6 via TOPICAL

## 2020-10-20 MED ORDER — IODIXANOL 320 MG/ML IV SOLN
INTRAVENOUS | Status: DC | PRN
  Administered 2020-10-20: 40 mL via INTRA_ARTERIAL

## 2020-10-20 MED ORDER — CLOPIDOGREL BISULFATE 75 MG PO TABS: 300.0000 mg | ORAL_TABLET | Freq: Once | ORAL | Status: DC

## 2020-10-20 MED ORDER — PHENYLEPHRINE 40 MCG/ML (10ML) SYRINGE FOR IV PUSH (FOR BLOOD PRESSURE SUPPORT)
PREFILLED_SYRINGE | INTRAVENOUS | Status: DC | PRN
  Administered 2020-10-20 (×2): 120 ug via INTRAVENOUS
  Administered 2020-10-20: 80 ug via INTRAVENOUS

## 2020-10-20 MED ORDER — SODIUM CHLORIDE 0.9% FLUSH
3.0000 mL | Freq: Two times a day (BID) | INTRAVENOUS | Status: DC
  Administered 2020-10-20 – 2020-10-22 (×4): 3 mL via INTRAVENOUS

## 2020-10-20 MED ORDER — MAGNESIUM SULFATE 2 GM/50ML IV SOLN
2.0000 g | Freq: Every day | INTRAVENOUS | Status: DC | PRN
Start: 2020-10-20 — End: 2020-10-22

## 2020-10-20 MED ORDER — FENTANYL CITRATE (PF) 250 MCG/5ML IJ SOLN
INTRAMUSCULAR | Status: AC
  Filled 2020-10-20: qty 5

## 2020-10-20 MED ORDER — LABETALOL HCL 5 MG/ML IV SOLN
10.0000 mg | INTRAVENOUS | Status: DC | PRN
Start: 2020-10-20 — End: 2020-10-20

## 2020-10-20 MED ORDER — SODIUM CHLORIDE 0.9 % WEIGHT BASED INFUSION
1.0000 mL/kg/h | INTRAVENOUS | Status: AC
  Administered 2020-10-20: 1 mL/kg/h via INTRAVENOUS

## 2020-10-20 MED ORDER — HEPARIN (PORCINE) IN NACL 1000-0.9 UT/500ML-% IV SOLN
INTRAVENOUS | Status: DC | PRN
  Administered 2020-10-20 (×2): 500 mL

## 2020-10-20 MED ORDER — MOMETASONE FURO-FORMOTEROL FUM 200-5 MCG/ACT IN AERO
2.0000 | INHALATION_SPRAY | Freq: Two times a day (BID) | RESPIRATORY_TRACT | Status: DC
  Administered 2020-10-21 – 2020-10-22 (×3): 2 via RESPIRATORY_TRACT
  Filled 2020-10-20: qty 8.8

## 2020-10-20 MED ORDER — TRAMADOL HCL 50 MG PO TABS: 50.0000 mg | ORAL_TABLET | Freq: Four times a day (QID) | ORAL | Status: DC | PRN

## 2020-10-20 MED ORDER — OXYCODONE HCL 5 MG PO TABS: 5.0000 mg | ORAL_TABLET | Freq: Once | ORAL | Status: DC | PRN

## 2020-10-20 MED ORDER — ATORVASTATIN CALCIUM 10 MG PO TABS
10.0000 mg | ORAL_TABLET | Freq: Every day | ORAL | Status: DC
  Administered 2020-10-21 – 2020-10-22 (×2): 10 mg
  Filled 2020-10-20 (×2): qty 1

## 2020-10-20 MED ORDER — SODIUM CHLORIDE 0.9 % IV SOLN: INTRAVENOUS | Status: DC

## 2020-10-20 MED ORDER — ASPIRIN EC 81 MG PO TBEC
81.0000 mg | DELAYED_RELEASE_TABLET | Freq: Every day | ORAL | Status: DC
  Administered 2020-10-21 – 2020-10-22 (×2): 81 mg via ORAL
  Filled 2020-10-20 (×2): qty 1

## 2020-10-20 MED ORDER — 0.9 % SODIUM CHLORIDE (POUR BTL) OPTIME
TOPICAL | Status: DC | PRN
  Administered 2020-10-20: 1000 mL

## 2020-10-20 MED ORDER — PROPOFOL 10 MG/ML IV BOLUS
INTRAVENOUS | Status: DC | PRN
  Administered 2020-10-20: 30 mg via INTRAVENOUS

## 2020-10-20 MED ORDER — POTASSIUM CHLORIDE CRYS ER 20 MEQ PO TBCR: 20.0000 meq | EXTENDED_RELEASE_TABLET | Freq: Every day | ORAL | Status: DC | PRN

## 2020-10-20 MED ORDER — PHENYLEPHRINE HCL-NACL 10-0.9 MG/250ML-% IV SOLN
INTRAVENOUS | Status: DC | PRN
  Administered 2020-10-20: 50 ug/min via INTRAVENOUS
  Administered 2020-10-20: 30 ug/min via INTRAVENOUS

## 2020-10-20 SURGICAL SUPPLY — 51 items
BAG COUNTER SPONGE SURGICOUNT (BAG) ×2 IMPLANT
BENZOIN TINCTURE PRP APPL 2/3 (GAUZE/BANDAGES/DRESSINGS) IMPLANT
CANISTER SUCT 3000ML PPV (MISCELLANEOUS) ×2 IMPLANT
CHLORAPREP W/TINT 26 (MISCELLANEOUS) IMPLANT
CLIP VESOCCLUDE MED 24/CT (CLIP) ×2 IMPLANT
CLIP VESOCCLUDE SM WIDE 24/CT (CLIP) ×2 IMPLANT
CUFF TOURN SGL QUICK 24 (TOURNIQUET CUFF)
CUFF TOURN SGL QUICK 34 (TOURNIQUET CUFF)
CUFF TOURN SGL QUICK 42 (TOURNIQUET CUFF) IMPLANT
CUFF TRNQT CYL 24X4X16.5-23 (TOURNIQUET CUFF) IMPLANT
CUFF TRNQT CYL 34X4.125X (TOURNIQUET CUFF) IMPLANT
DERMABOND ADVANCED (GAUZE/BANDAGES/DRESSINGS) ×1
DERMABOND ADVANCED .7 DNX12 (GAUZE/BANDAGES/DRESSINGS) ×1 IMPLANT
DRAIN CHANNEL 15F RND FF W/TCR (WOUND CARE) IMPLANT
DRAPE C-ARM 42X72 X-RAY (DRAPES) IMPLANT
DRAPE HALF SHEET 40X57 (DRAPES) IMPLANT
DRAPE X-RAY CASS 24X20 (DRAPES) IMPLANT
ELECT REM PT RETURN 9FT ADLT (ELECTROSURGICAL) ×2
ELECTRODE REM PT RTRN 9FT ADLT (ELECTROSURGICAL) ×1 IMPLANT
EVACUATOR SILICONE 100CC (DRAIN) IMPLANT
GAUZE SPONGE 4X4 12PLY STRL (GAUZE/BANDAGES/DRESSINGS) IMPLANT
GLOVE SURG POLYISO LF SZ8 (GLOVE) ×4 IMPLANT
GOWN STRL REUS W/ TWL LRG LVL3 (GOWN DISPOSABLE) ×2 IMPLANT
GOWN STRL REUS W/ TWL XL LVL3 (GOWN DISPOSABLE) ×1 IMPLANT
GOWN STRL REUS W/TWL LRG LVL3 (GOWN DISPOSABLE) ×4
GOWN STRL REUS W/TWL XL LVL3 (GOWN DISPOSABLE) ×2
INSERT FOGARTY SM (MISCELLANEOUS) IMPLANT
KIT BASIN OR (CUSTOM PROCEDURE TRAY) ×2 IMPLANT
KIT TURNOVER KIT B (KITS) ×2 IMPLANT
MARKER GRAFT CORONARY BYPASS (MISCELLANEOUS) IMPLANT
NS IRRIG 1000ML POUR BTL (IV SOLUTION) ×2 IMPLANT
PACK PERIPHERAL VASCULAR (CUSTOM PROCEDURE TRAY) ×2 IMPLANT
PAD ARMBOARD 7.5X6 YLW CONV (MISCELLANEOUS) ×4 IMPLANT
STOPCOCK 4 WAY LG BORE MALE ST (IV SETS) IMPLANT
STRIP CLOSURE SKIN 1/2X4 (GAUZE/BANDAGES/DRESSINGS) IMPLANT
SUT ETHILON 3 0 PS 1 (SUTURE) IMPLANT
SUT MNCRL AB 4-0 PS2 18 (SUTURE) ×2 IMPLANT
SUT PROLENE 5 0 C 1 24 (SUTURE) ×4 IMPLANT
SUT PROLENE 6 0 BV (SUTURE) ×2 IMPLANT
SUT SILK 2 0 SH (SUTURE) IMPLANT
SUT SILK 3 0 (SUTURE)
SUT SILK 3-0 18XBRD TIE 12 (SUTURE) IMPLANT
SUT VIC AB 2-0 CT1 27 (SUTURE) ×4
SUT VIC AB 2-0 CT1 TAPERPNT 27 (SUTURE) ×2 IMPLANT
SUT VIC AB 3-0 SH 27 (SUTURE) ×2
SUT VIC AB 3-0 SH 27X BRD (SUTURE) ×1 IMPLANT
TAPE UMBILICAL COTTON 1/8X30 (MISCELLANEOUS) IMPLANT
TOWEL GREEN STERILE (TOWEL DISPOSABLE) ×2 IMPLANT
TRAY FOLEY MTR SLVR 16FR STAT (SET/KITS/TRAYS/PACK) IMPLANT
UNDERPAD 30X36 HEAVY ABSORB (UNDERPADS AND DIAPERS) ×2 IMPLANT
WATER STERILE IRR 1000ML POUR (IV SOLUTION) ×2 IMPLANT

## 2020-10-20 SURGICAL SUPPLY — 16 items
BALLN MUSTANG 4X100X135 (BALLOONS) ×3
BALLOON MUSTANG 4X100X135 (BALLOONS) ×2 IMPLANT
CATH OMNI FLUSH 5F 65CM (CATHETERS) ×3 IMPLANT
DEVICE CLOSURE MYNXGRIP 6/7F (Vascular Products) ×3 IMPLANT
GLIDEWIRE ADV .035X260CM (WIRE) ×3 IMPLANT
KIT ANGIASSIST CO2 SYSTEM (KITS) ×3 IMPLANT
KIT ENCORE 26 ADVANTAGE (KITS) ×3 IMPLANT
KIT MICROPUNCTURE NIT STIFF (SHEATH) ×3 IMPLANT
KIT PV (KITS) ×3 IMPLANT
SHEATH PINNACLE 5F 10CM (SHEATH) ×3 IMPLANT
SHEATH PINNACLE 6F 10CM (SHEATH) ×3 IMPLANT
SHEATH PINNACLE ST 6F 45CM (SHEATH) ×3 IMPLANT
SHEATH PROBE COVER 6X72 (BAG) ×3 IMPLANT
TRANSDUCER W/STOPCOCK (MISCELLANEOUS) ×3 IMPLANT
TRAY PV CATH (CUSTOM PROCEDURE TRAY) ×3 IMPLANT
WIRE BENTSON .035X145CM (WIRE) ×3 IMPLANT

## 2020-10-20 NOTE — Progress Notes (Signed)
Pt arrived to unit from PACU, A-line Right radial, A/O x 4,  CCMD called ,CHG given, Pt has bilateral ulcers on both feet. Sacral dressing placed pt has healing pink skin on sacral  pt oriented to unit,Will continue to monitor.   Albin Felling Adrijana Haros, RN    10/20/20 1657  Vitals  Temp 97.6 F (36.4 C)  Temp Source Oral  BP (!) 120/58  MAP (mmHg) 76  BP Location Right Arm  BP Method Automatic  Patient Position (if appropriate) Lying  Pulse Rate 72  Pulse Rate Source Monitor  ECG Heart Rate 74  Resp 20  Level of Consciousness  Level of Consciousness Alert  Oxygen Therapy  SpO2 98 %  O2 Device Room Air  O2 Flow Rate (L/min) 0 L/min

## 2020-10-20 NOTE — Interval H&P Note (Signed)
History and Physical Interval Note:  10/20/2020 8:26 AM  Shannon [name]  has presented today for surgery, with the diagnosis of pad.  The various methods of treatment have been discussed with the patient and family. After consideration of risks, benefits and other options for treatment, the patient has consented to  Procedure(s): ABDOMINAL AORTOGRAM W/LOWER EXTREMITY (N/A) as a surgical intervention.  The patient's history has been reviewed, patient examined, no change in status, stable for surgery.  I have reviewed the patient's chart and labs.  Questions were answered to the patient's satisfaction.     Cherre Robins

## 2020-10-20 NOTE — Progress Notes (Signed)
Entering the patients room Oliver, RN at bedside holding pressure. Dr. Stanford Breed presents to the bedside shortly there after for assessment. This RN attempting 2nd IV site. OR is being notified for patients arrival. Dr. Stanford Breed contacting patients family. Patient remains a/o x4. OR staff presents to assist in escorted to OR.

## 2020-10-20 NOTE — Op Note (Signed)
DATE OF SERVICE: 10/20/2020  PATIENT:  Shannon [name]  87 y.o. female  PRE-OPERATIVE DIAGNOSIS:  Atherosclerosis of native arteries of left lower extremity causing ulceration  POST-OPERATIVE DIAGNOSIS:  Same  PROCEDURE:   1) US guided right common femoral artery access 2) Aortogram 3) Left lower extremity angiogram with third order cannulation (1mL total contrast) 4) left popliteal angioplasty (4x130mm Mustang)  SURGEON:  Surgeon(s) and Role:    * Cherre Robins, MD - Primary  ASSISTANT: none  ANESTHESIA:   local  EBL: min  BLOOD ADMINISTERED:none  DRAINS: none   LOCAL MEDICATIONS USED:  LIDOCAINE   SPECIMEN:  none  COUNTS: confirmed correct.  TOURNIQUET:  none  PATIENT DISPOSITION:  PACU - hemodynamically stable.   Delay start of Pharmacological VTE agent (>24hrs) due to surgical blood loss or risk of bleeding: no  INDICATION FOR PROCEDURE: Shannon [name] is a 87 y.o. female with nonhealing left medial foot wound with noninvasive evidence of peripheral arterial disease. After careful discussion of risks, benefits, and alternatives the patient was offered angiography. We specifically discussed access site complication. The patient understood and wished to proceed.  OPERATIVE FINDINGS:  Aorta No flow-limiting stenosis  Iliac arteries No flow-limiting stenosis in common or external iliac arteries.  Evaluation limited by carbon oxide contrast.  Left lower extremity No flow-limiting stenosis in common femoral, profunda femoris arteries Diffuse disease and superficial femoral artery, no hemodynamically significant stenosis 2 areas of severe stenosis in the popliteal artery (greater than 80%) Below-knee popliteal artery without flow-limiting stenosis Tibial trifurcation is patent. Left anterior tibial artery occludes proximal calf Tibioperoneal trunk with mild disease (50% stenosis) which does not appear to limit flow Posterior tibial artery and peroneal arteries  dominant and flow to the foot without significant stenosis Disadvantaged pedal circulation   DESCRIPTION OF PROCEDURE: After identification of the patient in the pre-operative holding area, the patient was transferred to the operating room. The patient was positioned supine on the operating room table. Anesthesia was induced. The groins was prepped and draped in standard fashion. A surgical pause was performed confirming correct patient, procedure, and operative location.  The right groin was anesthetized with subcutaneous injection of 1% lidocaine. Using ultrasound guidance, the right common femoral artery was accessed with micropuncture technique. Fluoroscopy was used to confirm cannulation over the femoral head. Sheathogram was not performed. The 54F sheath was upsized to 85F.   An 035 glidewire advantage was advanced into the distal aorta. Over the wire an omni flush catheter was advanced to the level of L2. Aortogram was performed - see above for details.   The left common iliac artery was selected with the 035 glidewire advantage. The wire was advanced into the common femoral artery. Over the wire the omni flush catheter was advanced into the external iliac artery. Selective angiography was performed - see above for details.   The decision was made to intervene. The patient was heparinized with 5000 units of heparin. The sheath was exchanged for a 35F x 45cm sheath. Selective angiography of the left lower extremity was performed prior to intervention. The popliteal lesion was treated with a 4 x 100 mm Mustang balloon.  Completion angiography revealed resolution of popliteal stenosis  A mynx device was used to close the arteriotomy. Hemostasis was excellent upon completion.  Upon completion of the case instrument and sharps counts were confirmed correct. The patient was transferred to the PACU in good condition. I was present for all portions of the procedure.  PLAN:  Good technical result from  angiography.  Disadvantaged pedal circulation will make wound healing difficult.  Recommend aspirin 81 mg p.o. indefinitely.  Recommend Plavix 75 mg p.o. x3 months.  Recommend high intensity statin therapy indefinitely.  Follow-up with me in 1 month.  Okay to proceed with left transmetatarsal amputation from my perspective.  [name]. Stanford Breed, MD [facility] Phone Number: [phone] 10/20/2020 9:32 AM

## 2020-10-20 NOTE — Progress Notes (Signed)
At 11:45am, this RN went to assess patient's groin site and found a hematoma.  Pressure applied and Dr. Stanford Breed was paged. Shortly after, Cath lab staff at bedside to aid with assessment.  Pt's BP was noted to be in the 791'R systolic down from the 041'J systolic.  Pt remained a/o x 4 and had some minor pain at the site. Cath lab staff took over care and this RN aided.

## 2020-10-20 NOTE — Anesthesia Procedure Notes (Signed)
Arterial Line Insertion Start/End7/20/2022 12:33 PM Performed by: Kyung Rudd, CRNA, CRNA  Patient location: OR. Preanesthetic checklist: patient identified, IV checked, site marked, risks and benefits discussed, surgical consent, monitors and equipment checked, pre-op evaluation, timeout performed and anesthesia consent Emergency situation Patient sedated Right, radial was placed Catheter size: 20 G Hand hygiene performed  and maximum sterile barriers used   Attempts: 1 Procedure performed without using ultrasound guided technique. Following insertion, dressing applied and Biopatch. Post procedure assessment: normal  Patient tolerated the procedure well with no immediate complications.

## 2020-10-20 NOTE — Anesthesia Procedure Notes (Signed)
Central Venous Catheter Insertion Performed by: Audry Pili, MD, anesthesiologist Start/End7/20/2022 12:38 PM, 10/20/2020 12:46 PM Patient location: OR. Emergency situation Preanesthetic checklist: patient identified, IV checked, monitors and equipment checked and timeout performed Position: supine Patient sedated Hand hygiene performed , maximum sterile barriers used  and Seldinger technique used Catheter size: 8 Fr Central line was placed.Double lumen Procedure performed using ultrasound guided technique. Ultrasound Notes:anatomy identified, needle tip was noted to be adjacent to the nerve/plexus identified, no ultrasound evidence of intravascular and/or intraneural injection and image(s) printed for medical record Attempts: 1 Following insertion, line sutured, dressing applied and Biopatch. Post procedure assessment: blood return through all ports, free fluid flow and no air  Patient tolerated the procedure well with no immediate complications.

## 2020-10-20 NOTE — Telephone Encounter (Signed)
Informed by IR team that Miss Bost's daughter called and had questions regarding patient's G tube.   Called daughter Anastasia Pall, call went to VM. Left VM for call back.    Armando Gang Raylee Adamec PA-C 10/20/2020 1:09 PM

## 2020-10-20 NOTE — Progress Notes (Signed)
During routine assessment of patient groin site, the right groin site noted to have an area of hardness and swelling as compared to the left side.  Pressure was held for about 15-20 minutes and the site became soft again.  Dr. Stanford Breed paged concerning hematoma and ordered medications and gave verbal for bed rest x 6 hours and to hold the ordered Plavix and aspirin for the day.  Patient given tylenol for pain.  Will continue to monitor.

## 2020-10-20 NOTE — Anesthesia Procedure Notes (Signed)
Procedure Name: Intubation Date/Time: 10/20/2020 12:31 PM Performed by: Babs Bertin, CRNA Pre-anesthesia Checklist: Patient identified, Emergency Drugs available, Suction available, Patient being monitored and Timeout performed Patient Re-evaluated:Patient Re-evaluated prior to induction Oxygen Delivery Method: Circle system utilized Preoxygenation: Pre-oxygenation with 100% oxygen Induction Type: IV induction, Rapid sequence and Cricoid Pressure applied Laryngoscope Size: Mac and 3 Grade View: Grade I Tube type: Oral Tube size: 7.0 mm Number of attempts: 1 Airway Equipment and Method: Stylet Placement Confirmation: ETT inserted through vocal cords under direct vision, positive ETCO2 and breath sounds checked- equal and bilateral Secured at: 22 cm Tube secured with: Tape Dental Injury: Teeth and Oropharynx as per pre-operative assessment

## 2020-10-20 NOTE — Anesthesia Preprocedure Evaluation (Addendum)
Anesthesia Evaluation   Patient confused    Reviewed: Allergy & Precautions, Patient's Chart, lab work & pertinent test results, Unable to perform ROS - Chart review onlyPreop documentation limited or incomplete due to emergent nature of procedure.  History of Anesthesia Complications Negative for: history of anesthetic complications  Airway   TM Distance: >3 FB    Comment:  Unable to complete airway assessment due to emergent situation  Dental   Pulmonary asthma , COPD,    Pulmonary exam normal        Cardiovascular hypertension, + CAD, + Peripheral Vascular Disease and +CHF  Normal cardiovascular exam     Neuro/Psych  Headaches, PSYCHIATRIC DISORDERS Depression CVA    GI/Hepatic hiatal hernia, PUD, GERD  Medicated,  Endo/Other  diabetes, Insulin DependentHypothyroidism   Renal/GU      Musculoskeletal  (+) Arthritis ,   Abdominal   Peds  Hematology  On plavix    Anesthesia Other Findings   Reproductive/Obstetrics                            Anesthesia Physical Anesthesia Plan  ASA: 4 and emergent  Anesthesia Plan: General   Post-op Pain Management:    Induction: Intravenous and Rapid sequence  PONV Risk Score and Plan: 3 and Treatment may vary due to age or medical condition and Ondansetron  Airway Management Planned: Oral ETT  Additional Equipment: Arterial line, CVP and Ultrasound Guidance Line Placement  Intra-op Plan:   Post-operative Plan: Possible Post-op intubation/ventilation  Informed Consent:     Only emergency history available and History available from chart only  Plan Discussed with: CRNA, Anesthesiologist and Surgeon  Anesthesia Plan Comments:       Anesthesia Quick Evaluation

## 2020-10-20 NOTE — Progress Notes (Signed)
[facility] OF [place] PROGRESS NOTE  ASSESSMENT / PLAN: Shannon [name] is a 87 y.o. female status post right groin exploration with common femoral artery repair for hematoma with hypotension after LLE angiography / angioplasty.  Thankfully doing quite well. Updated patient on operative conduct. Updated daughter by telephone post procedure.    SUBJECTIVE: No complaints. Groin sore.  OBJECTIVE: BP 131/67   Pulse 72   Temp 97.6 F (36.4 C) (Oral)   Resp 20   Ht 5\' 6"  (1.676 m)   Wt 54.9 kg   SpO2 98%   BMI 19.53 kg/m   Intake/Output Summary (Last 24 hours) at 10/20/2020 1811 Last data filed at 10/20/2020 1328 Gross per 24 hour  Intake 1700 ml  Output 20 ml  Net 1680 ml    No distress RRR R groin soft  CBC Latest Ref Rng & Units 10/20/2020 10/20/2020 10/20/2020  WBC 4.0 - 10.5 K/uL 6.7 - -  Hemoglobin 12.0 - 15.0 g/dL 7.1(L) 8.5(L) 9.5(L)  Hematocrit 36.0 - 46.0 % 22.5(L) 25.0(L) 28.0(L)  Platelets 150 - 400 K/uL 199 - -     CMP Latest Ref Rng & Units 10/20/2020 10/20/2020 10/20/2020  Glucose 70 - 99 mg/dL 128(H) - 114(H)  BUN 8 - 23 mg/dL 44(H) - 50(H)  Creatinine 0.44 - 1.00 mg/dL 1.47(H) - 1.80(H)  Sodium 135 - 145 mmol/L 136 137 137  Potassium 3.5 - 5.1 mmol/L 4.2 4.4 4.9  Chloride 98 - 111 mmol/L 104 - 99  CO2 22 - 32 mmol/L 28 - -  Calcium 8.9 - 10.3 mg/dL 8.7(L) - -  Total Protein 6.1 - 8.1 g/dL - - -  Total Bilirubin 0.2 - 1.2 mg/dL - - -  Alkaline Phos 38 - 126 U/L - - -  AST 10 - 35 U/L - - -  ALT 6 - 29 U/L - - -    Estimated Creatinine Clearance: 19.8 mL/min (A) (by C-G formula based on SCr of 1.47 mg/dL (H)).  Shannon Aline. Stanford Breed, MD [facility] of [facility] Phone Number: [phone] 10/20/2020 6:11 PM

## 2020-10-20 NOTE — Transfer of Care (Signed)
Immediate Anesthesia Transfer of Care Note  Patient: Shannon [name]  Procedure(s) Performed: PRIMARY CLOSURE COMMON FEMORAL ARTERY (Right: Groin) EVACUATION HEMATOMA RIGHT GROIN (Right: Groin)  Patient Location: PACU  Anesthesia Type:General  Level of Consciousness: awake and alert   Airway & Oxygen Therapy: Patient Spontanous Breathing  Post-op Assessment: Report given to RN and Post -op Vital signs reviewed and stable  Post vital signs: Reviewed and stable  Last Vitals:  Vitals Value Taken Time  BP 140/62 10/20/20 1332  Temp    Pulse 100 10/20/20 1334  Resp 14 10/20/20 1334  SpO2 97 % 10/20/20 1334  Vitals shown include unvalidated device data.  Last Pain:  Vitals:   10/20/20 0943  TempSrc:   PainSc: 0-No pain      Patients Stated Pain Goal: 0 (79/15/04 1364)  Complications: No notable events documented.

## 2020-10-20 NOTE — Op Note (Signed)
DATE OF SERVICE: 10/20/2020  PATIENT:  Shannon [name]  87 y.o. female  PRE-OPERATIVE DIAGNOSIS:  right groin hematoma, severe hypotension  POST-OPERATIVE DIAGNOSIS:  Same  PROCEDURE:   1) right groin exploration 2) direct repair of common femoral arteriotomy  SURGEON:  Surgeon(s) and Role:    * Cherre Robins, MD - Primary  ASSISTANT: Paulo Fruit, PA-C  An assistant was required to facilitate exposure and expedite the case.  ANESTHESIA:   general  EBL: min  BLOOD ADMINISTERED:none  DRAINS: none   LOCAL MEDICATIONS USED:  NONE  SPECIMEN:  none  COUNTS: confirmed correct.  TOURNIQUET:  none  PATIENT DISPOSITION:  PACU - hemodynamically stable.   Delay start of Pharmacological VTE agent (>24hrs) due to surgical blood loss or risk of bleeding: no  INDICATION FOR PROCEDURE: Shannon [name] is a 87 y.o. female with hematoma and hypotension after percutaneous procedure today.  Given the patient's tenuous status I brought to the patient emergently to the operating room to repair her common femoral artery.  I discussed with the patient's daughter prior to proceeding.  OPERATIVE FINDINGS: Right groin explored using a longitudinal incision.  A moderate hematoma was encountered.  A closure device was identified in the subcutaneous tissue and was not seated well on the artery.  The common femoral arteriotomy was inspected.  This was hemostatic.  I opened the arteriotomy transversely to inspect the back wall and saw no evidence of posterior injury.  The arteriotomy was repaired using interrupted 5-0 sutures x2.  DESCRIPTION OF PROCEDURE: After identification of the patient in the pre-operative holding area, the patient was transferred to the operating room. The patient was positioned supine on the operating room table. Anesthesia was induced. The right groin was prepped and draped in standard fashion. A surgical pause was performed confirming correct patient, procedure, and operative  location.  A longitudinal incision was made over the right groin overlying the right common femoral artery.  The incision was carried down through subcutaneous tissue until the femoral sheath was identified.  Femoral sheath was opened and the common femoral artery, profunda femoris artery, superficial femoral artery were skeletonized.  A common femoral arteriotomy was identified from the mornings angiogram.  This was hemostatic at the time of exploration.  I heparinized the patient and obtained clamp control of the common femoral artery with a Henley clamp on the external iliac artery, and Gregory clamps on the superficial femoral and profunda femoris arteries.  The common femoral arteriotomy was opened transversely.  I inspected the back wall of the common femoral artery and found no injury.  The minx device was not identified in the lumen of the common femoral artery.  The arteriotomy was then repaired with 2 interrupted sutures of 5-0 Prolene.  Identified the minx device in the subcutaneous tissue.  Clamps were released on the artery sequentially and the repair is found to be hemostatic.  Satisfied we ended the case here.  The groin was closed in layers using 2-0 Vicryl, 3-0 Vicryl, 4-0 Monocryl.  Dermabond was applied to the skin.  Upon completion of the case instrument and sharps counts were confirmed correct. The patient was transferred to the PACU in good condition. I was present for all portions of the procedure.  [name]. Stanford Breed, MD [facility] Phone Number: [phone] 10/20/2020 1:25 PM

## 2020-10-20 NOTE — Telephone Encounter (Signed)
Received call back from Miss Soledad Gerlach.   She states that there has been a lot of leakage from the [place] tube site.  The tube was replaced at [facility] IR yesterday.  Patient is currently at [facility] PACU, and will be hospitalized over night per Miss Armstrong.   Informed IR APP will check on the tube either today or tomorrow.  Miss. Armstrong verbalized understanding.    Armando Gang Kacee Koren PA-C 10/20/2020 2:15 PM

## 2020-10-20 NOTE — Progress Notes (Signed)
Called to PSS 14 for pt with hematoma; upon arrival T. Richardson Landry  was holding pressure. I took over holding pressure to RFa and noted pt with a very large hematoma to rfa- advised that this was a "mynx failure"; pressure held, advised that Dr. Stanford Breed was on his way to evaluate . Pt. Blood Pressure noted systolic of 74/. Normal saline bolus started; Dr. Stanford Breed arrived at bedside - took over holding pressure to rfa- requested call to OR. OR placed on standby, blood pressure to 82/ systolic, transported emergently to OR #16 with Dr. Stanford Breed, CRNA and myself.

## 2020-10-21 ENCOUNTER — Encounter (HOSPITAL_COMMUNITY): Payer: Self-pay | Admitting: Vascular Surgery

## 2020-10-21 LAB — BASIC METABOLIC PANEL
Anion gap: 6 (ref 5–15)
BUN: 39 mg/dL — ABNORMAL HIGH (ref 8–23)
CO2: 26 mmol/L (ref 22–32)
Calcium: 8.5 mg/dL — ABNORMAL LOW (ref 8.9–10.3)
Chloride: 102 mmol/L (ref 98–111)
Creatinine, Ser: 1.45 mg/dL — ABNORMAL HIGH (ref 0.44–1.00)
GFR, Estimated: 33 mL/min — ABNORMAL LOW (ref >60)
Glucose, Bld: 146 mg/dL — ABNORMAL HIGH (ref 70–99)
Potassium: 4.2 mmol/L (ref 3.5–5.1)
Sodium: 134 mmol/L — ABNORMAL LOW (ref 135–145)

## 2020-10-21 LAB — GLUCOSE, CAPILLARY
Glucose-Capillary: 144 mg/dL — ABNORMAL HIGH (ref 70–99)
Glucose-Capillary: 163 mg/dL — ABNORMAL HIGH (ref 70–99)
Glucose-Capillary: 239 mg/dL — ABNORMAL HIGH (ref 70–99)
Glucose-Capillary: 180 mg/dL — ABNORMAL HIGH (ref 70–99)
Glucose-Capillary: 133 mg/dL — ABNORMAL HIGH (ref 70–99)

## 2020-10-21 LAB — CBC
HCT: 20.2 % — ABNORMAL LOW (ref 36.0–46.0)
Hemoglobin: 6.8 g/dL — CL (ref 12.0–15.0)
MCH: 29.6 pg (ref 26.0–34.0)
MCHC: 33.7 g/dL (ref 30.0–36.0)
MCV: 87.8 fL (ref 80.0–100.0)
Platelets: 183 10*3/uL (ref 150–400)
RBC: 2.3 MIL/uL — ABNORMAL LOW (ref 3.87–5.11)
RDW: 19.8 % — ABNORMAL HIGH (ref 11.5–15.5)
WBC: 5.8 10*3/uL (ref 4.0–10.5)
nRBC: 0 % (ref 0.0–0.2)

## 2020-10-21 LAB — SURGICAL PCR SCREEN
MRSA, PCR: NEGATIVE
Staphylococcus aureus: NEGATIVE

## 2020-10-21 LAB — PREPARE RBC (CROSSMATCH)

## 2020-10-21 MED ORDER — OSMOLITE 1.5 CAL PO LIQD
1000.0000 mL | ORAL | Status: DC
  Administered 2020-10-21: 1000 mL
  Filled 2020-10-21 (×2): qty 1000

## 2020-10-21 MED ORDER — INSULIN ASPART 100 UNIT/ML IJ SOLN
0.0000 [IU] | Freq: Three times a day (TID) | INTRAMUSCULAR | Status: DC
  Administered 2020-10-21 – 2020-10-22 (×2): 3 [IU] via SUBCUTANEOUS

## 2020-10-21 MED ORDER — OSMOLITE 1.5 CAL PO LIQD: 1000.0000 mL | ORAL | Status: DC

## 2020-10-21 MED ORDER — PROSOURCE PLUS PO LIQD
30.0000 mL | Freq: Three times a day (TID) | ORAL | Status: DC
  Administered 2020-10-22: 30 mL via ORAL
  Filled 2020-10-21 (×2): qty 30

## 2020-10-21 MED ORDER — SODIUM CHLORIDE 0.9% IV SOLUTION: Freq: Once | INTRAVENOUS | Status: AC

## 2020-10-21 MED ORDER — PROSOURCE TF PO LIQD
45.0000 mL | Freq: Three times a day (TID) | ORAL | Status: DC
  Filled 2020-10-21 (×2): qty 45

## 2020-10-21 NOTE — Anesthesia Postprocedure Evaluation (Signed)
Anesthesia Post Note  Patient: MATY ZEISLER  Procedure(s) Performed: PRIMARY CLOSURE COMMON FEMORAL ARTERY (Right: Groin) EVACUATION HEMATOMA RIGHT GROIN (Right: Groin)     Patient location during evaluation: PACU Anesthesia Type: General Level of consciousness: awake and alert Pain management: pain level controlled Vital Signs Assessment: post-procedure vital signs reviewed and stable Respiratory status: spontaneous breathing, nonlabored ventilation, respiratory function stable and patient connected to nasal cannula oxygen Cardiovascular status: blood pressure returned to baseline and stable Postop Assessment: no apparent nausea or vomiting Anesthetic complications: no   No notable events documented.  Last Vitals:  Vitals:   10/21/20 1505 10/21/20 1507  BP: (!) 158/54 (!) 158/54  Pulse: 83   Resp: 15   Temp: 37.6 C 37.6 C  SpO2:      Last Pain:  Vitals:   10/21/20 1507  TempSrc: Oral  PainSc:    Pain Goal: Patients Stated Pain Goal: 5 (10/20/20 1350)                 Audry Pili

## 2020-10-21 NOTE — Progress Notes (Signed)
Supervising Physician: Ruthann Cancer  Patient Status:  [facility] - In-pt  Chief Complaint: Leaking GJ  Subjective:  Ms. Stroh seen in her room, pRBCs infusing, she tells me the [place] tube leaks all the time at home sometimes soaking her clothes. She has not had any issues using the tube. She complains of urine soaking her gown right now.   Allergies: Penicillins  Medications: Prior to Admission medications   Medication Sig Start Date End Date Taking? Authorizing Provider  acetaminophen (TYLENOL) 500 MG tablet Take 500 mg by mouth every 6 (six) hours as needed for moderate pain.   Yes [provider]  aspirin 81 MG EC tablet Take 1 tablet (81 mg total) by mouth daily. Swallow whole. 10/20/20 10/20/21 Yes Cherre Robins, MD  atorvastatin (LIPITOR) 10 MG tablet TAKE 1 TABLET BY MOUTH EVERY DAY 06/24/20  Yes [name], NP  b complex vitamins tablet Take 1 tablet by mouth daily.    Yes [provider]  cholecalciferol (VITAMIN D3) 25 MCG (1000 UNIT) tablet Take 1,000 Units by mouth daily.   Yes [provider]  clopidogrel (PLAVIX) 75 MG tablet Take 1 tablet (75 mg total) by mouth daily. 10/20/20 10/20/21 Yes Cherre Robins, MD  cycloSPORINE (RESTASIS) 0.05 % ophthalmic emulsion USE 1 DROP INTO BOTH EYES TWICE DAILY 01/29/17  Yes Eulas Post, Brayton Layman, DO  DULoxetine (CYMBALTA) 30 MG capsule TAKE 1 CAPSULE BY MOUTH EVERY DAY 05/05/20  Yes [name], NP  ferrous sulfate 220 (44 Fe) MG/5ML solution TAKE 7.4 MLS (325 MG TOTAL) BY MOUTH DAILY. 03/04/20  Yes [name], NP  fluticasone (FLONASE) 50 MCG/ACT nasal spray SPRAY 2 SPRAYS INTO EACH NOSTRIL EVERY DAY 06/25/19  Yes [name], NP  furosemide (LASIX) 40 MG tablet TAKE 0.5-1 TABLETS (20-40 MG TOTAL) BY MOUTH SEE ADMIN INSTRUCTIONS. TAKE 20 MG DAILY, MAY INSTEAD TAKE 40 MG DAILY AS NEEDED FOR EDEMA 08/04/20  Yes Freada Bergeron, MD  gabapentin (NEURONTIN) 300 MG capsule Take 1 capsule (300 mg  total) by mouth 2 (two) times daily. 05/17/20  Yes [name], NP  hydrALAZINE (APRESOLINE) 50 MG tablet TAKE 1 TABLET BY MOUTH THREE TIMES A DAY 04/26/20  Yes Dorothy Spark, MD  isosorbide mononitrate (IMDUR) 30 MG 24 hr tablet TAKE 2 TABLETS BY MOUTH DAILY. 09/20/20  Yes Pemberton, Greer Ee, MD  latanoprost (XALATAN) 0.005 % ophthalmic solution Place 1 drop into both eyes at bedtime.   Yes [provider]  levothyroxine (SYNTHROID) 125 MCG tablet TAKE 1 TABLET BY MOUTH EVERY DAY 06/24/20  Yes [name], NP  loratadine (CLARITIN) 10 MG tablet Take 10 mg by mouth daily.   Yes [provider]  mometasone-formoterol (DULERA) 200-5 MCG/ACT AERO INHALE TWO PUFFS BY MOUTH TWICE DAILY 10/05/20  Yes [name], NP  Multiple Vitamin (MULTIVITAMIN WITH MINERALS) TABS tablet Take 1 tablet by mouth daily.   Yes [provider]  Nutritional Supplements (FEEDING SUPPLEMENT, OSMOLITE 1.5 CAL,) LIQD Place 711 mLs into feeding tube See admin instructions. Take 711 ml over a 12 hour period per day 9 am-9 pm at a rate of 65   Yes [provider]  nystatin (MYCOSTATIN) 100000 UNIT/ML suspension Take 5 mLs (500,000 Units total) by mouth 4 (four) times daily. 09/06/20  Yes [name], NP  nystatin cream (MYCOSTATIN) APPLY TO AFFECTED AREA TWICE A DAY 05/10/20  Yes [name], NP  oxyCODONE (ROXICODONE) 5 MG immediate  release tablet Take 0.5-1 tablets (2.5-5 mg total) by mouth every 8 (eight) hours as needed for severe pain. 10/05/20  Yes [name], NP  pantoprazole (PROTONIX) 40 MG tablet Take 1 tablet (40 mg total) by mouth 2 (two) times daily. 10/15/20  Yes [name], NP  Polyethyl Glycol-Propyl Glycol (SYSTANE) 0.4-0.3 % GEL ophthalmic gel Place 1 application into both eyes 2 (two) times daily as needed (moisture).   Yes [provider]  Probiotic Product (EQL PROBIOTIC COLON SUPPORT PO) Take 1 capsule by mouth daily.   Yes  [provider]  senna-docusate (SENOKOT-S) 8.6-50 MG tablet Take 1 tablet by mouth as needed for mild constipation.   Yes [provider]  shark liver oil-cocoa butter (PREPARATION H) 0.25-3-85.5 % suppository Place 1 suppository rectally as needed for hemorrhoids.   Yes [provider]  sucralfate (CARAFATE) 1 g tablet Take 1 tablet (1 g total) by mouth 4 (four) times daily -  with meals and at bedtime. 10/12/20  Yes Nita Sells, MD  Water For Irrigation, Sterile (FREE WATER) SOLN Place 180 mLs into feeding tube 3 (three) times daily.   Yes [provider]  White Petrolatum-Mineral Oil (SYSTANE NIGHTTIME) OINT Place 1 application into both eyes at bedtime.    Yes [provider]  amLODipine (NORVASC) 5 MG tablet Take 1 tablet (5 mg total) by mouth daily. 10/20/19 10/19/20  Medina-Vargas, Monina C, NP  gentamicin cream (GARAMYCIN) 0.1 % Apply 1 application topically daily as needed (dry skin). 10/01/19   Edrick Kins, DPM  Insulin Pen Needle (B-D UF III MINI PEN NEEDLES) 31G X 5 MM MISC Use twice daily with giving insulin injections. Dx E11.9 07/17/18   [name], NP  ipratropium-albuterol (DUONEB) 0.5-2.5 (3) MG/3ML SOLN Take 3 mLs by nebulization every 6 (six) hours as needed (shortness of breath).    [provider]  levalbuterol Penne Lash HFA) 45 MCG/ACT inhaler Inhale 2 puffs into the lungs every 6 (six) hours as needed for wheezing.    [provider]  nystatin (MYCOSTATIN/NYSTOP) powder Apply 1 application topically as needed (skin irritation). 09/11/19   Ngetich, Dinah C, NP     Vital Signs: BP (!) 152/49   Pulse 77   Temp 99 F (37.2 C) (Oral)   Resp 19   Ht 5\' 6"  (1.676 m)   Wt 121 lb (54.9 kg)   SpO2 100%   BMI 19.53 kg/m   Physical Exam Vitals and nursing note reviewed.  Constitutional:      General: She is not in acute distress. HENT:     Head: Normocephalic.  Cardiovascular:     Rate and  Rhythm: Normal rate.  Pulmonary:     Effort: Pulmonary effort is normal.  Abdominal:     Palpations: Abdomen is soft.     Comments: Multiple surgical scars GJ with tube feeds infusing -- gauze and tape covering insertion site, multiple pieces of split gauze between bumper and skin, split gauze does have small amount of drainage present which appears to be consistent with tube feeds. Tract is noted to be significantly eroded causing tube to be quite loose within the tract. Split gauze removed and bumper cinched to skin without difficulty.  Skin:    General: Skin is warm and dry.  Neurological:     Mental Status: She is alert. Mental status is at baseline.    Imaging: IR GJ Tube Change  Result Date: 10/19/2020 INDICATION: 87 year old female with chronic indwelling  gastrojejunostomy tube presenting with leaking. EXAM: FLUOROSCOPIC GUIDED REPLACEMENT OF GASTROJEJUNOSTOMY TUBE COMPARISON:  10/14/2020 MEDICATIONS: None. CONTRAST:  25mL OMNIPAQUE IOHEXOL 300 MG/ML  SOLN FLUOROSCOPY TIME:  0 minutes 54 seconds, 5 COMPLICATIONS: None. PROCEDURE: Informed written consent was obtained from the patient after a discussion of the risks and benefits. The upper abdomen and the external portion of the existing gastrojejunostomy tube was prepped and draped in the usual sterile fashion, and a sterile drape was applied covering the operative field. Maximum barrier sterile technique with sterile gowns and gloves were used for the procedure. A timeout was performed prior to the initiation of the procedure. Hand injection of contrast via the jejunal and gastric lumens demonstrate patency of the indwelling tube. A Glidewire was then inserted through the jejunal lumen. There was difficulty advancing passed a kink in the tube within the gastric fundus. The wire was then able to be passed after insertion of a Kumpe catheter over the wire. The Glidewire was positioned in proximal jejunum the balloon deflated, and the indwelling  gastrojejunostomy tube was removed. A new, 22 French gastrojejunostomy tube was then inserted. There was redundancy within the stomach due to angulation of regional insertion, with the tip of the jejunal arm in the proximal duodenum. Contrast injection via the jejunostomy and gastric lumens confirmed appropriate functioning and positioning. A dressing was placed. The patient tolerated procedure well without immediate postprocedural complication. IMPRESSION: Successful fluoroscopic guided exchange of 22 French gastrojejunostomy tube. Ruthann Cancer, MD [facility] Radiology Electronically Signed   By: Ruthann Cancer MD   On: 10/19/2020 17:02   PERIPHERAL VASCULAR CATHETERIZATION  Result Date: 10/20/2020 DATE OF SERVICE: 10/20/2020  PATIENT:  Flonnie Overman  87 y.o. female  PRE-OPERATIVE DIAGNOSIS:  Atherosclerosis of native arteries of left lower extremity causing ulceration  POST-OPERATIVE DIAGNOSIS:  Same  PROCEDURE:  1) US guided right common femoral artery access 2) Aortogram 3) Left lower extremity angiogram with third order cannulation (46mL total contrast) 4) left popliteal angioplasty (4x137mm Mustang)  SURGEON:  Surgeon(s) and Role:    * Cherre Robins, MD - Primary  ASSISTANT: none  ANESTHESIA:   local  EBL: min  BLOOD ADMINISTERED:none  DRAINS: none  LOCAL MEDICATIONS USED:  LIDOCAINE  SPECIMEN:  none  COUNTS: confirmed correct.  TOURNIQUET:  none  PATIENT DISPOSITION:  PACU - hemodynamically stable.  Delay start of Pharmacological VTE agent (>24hrs) due to surgical blood loss or risk of bleeding: no  INDICATION FOR PROCEDURE: LIVI MCGANN is a 87 y.o. female with nonhealing left medial foot wound with noninvasive evidence of peripheral arterial disease. After careful discussion of risks, benefits, and alternatives the patient was offered angiography. We specifically discussed access site complication. The patient understood and wished to proceed.   OPERATIVE FINDINGS: Aorta No flow-limiting stenosis Iliac arteries No flow-limiting stenosis in common or external iliac arteries.  Evaluation limited by carbon oxide contrast. Left lower extremity No flow-limiting stenosis in common femoral, profunda femoris arteries Diffuse disease and superficial femoral artery, no hemodynamically significant stenosis 2 areas of severe stenosis in the popliteal artery (greater than 80%) Below-knee popliteal artery without flow-limiting stenosis Tibial trifurcation is patent. Left anterior tibial artery occludes proximal calf Tibioperoneal trunk with mild disease (50% stenosis) which does not appear to limit flow Posterior tibial artery and peroneal arteries dominant and flow to the foot without significant stenosis Disadvantaged pedal circulation   DESCRIPTION OF PROCEDURE: After identification of the patient in the pre-operative holding area,  the patient was transferred to the operating room. The patient was positioned supine on the operating room table. Anesthesia was induced. The groins was prepped and draped in standard fashion. A surgical pause was performed confirming correct patient, procedure, and operative location.  The right groin was anesthetized with subcutaneous injection of 1% lidocaine. Using ultrasound guidance, the right common femoral artery was accessed with micropuncture technique. Fluoroscopy was used to confirm cannulation over the femoral head. Sheathogram was not performed. The 83F sheath was upsized to 18F.  An 035 glidewire advantage was advanced into the distal aorta. Over the wire an omni flush catheter was advanced to the level of L2. Aortogram was performed - see above for details.  The left common iliac artery was selected with the 035 glidewire advantage. The wire was advanced into the common femoral artery. Over the wire the omni flush catheter was advanced into the external iliac artery. Selective angiography was performed - see above for details.   The decision was made to intervene. The patient was heparinized with 5000 units of heparin. The sheath was exchanged for a 53F x 45cm sheath. Selective angiography of the left lower extremity was performed prior to intervention. The popliteal lesion was treated with a 4 x 100 mm Mustang balloon.  Completion angiography revealed resolution of popliteal stenosis  A mynx device was used to close the arteriotomy. Hemostasis was excellent upon completion.  Upon completion of the case instrument and sharps counts were confirmed correct. The patient was transferred to the PACU in good condition. I was present for all portions of the procedure.  PLAN: Good technical result from angiography.  Disadvantaged pedal circulation will make wound healing difficult.  Recommend aspirin 81 mg p.o. indefinitely.  Recommend Plavix 75 mg p.o. x3 months.  Recommend high intensity statin therapy indefinitely.  Follow-up with me in 1 month.  Okay to proceed with left transmetatarsal amputation from my perspective.  [name]. Stanford Breed, MD [facility] Phone Number: [phone] 10/20/2020 9:32 AM    Labs:  CBC: Recent Labs    10/13/20 0324 10/15/20 1436 10/20/20 0747 10/20/20 1241 10/20/20 1637 10/21/20 0424  WBC 4.8 6.9  --   --  6.7 5.8  HGB 9.5* 9.8* 9.5* 8.5* 7.1* 6.8*  HCT 28.5* 30.0* 28.0* 25.0* 22.5* 20.2*  PLT 204 250  --   --  199 183    COAGS: Recent Labs    11/14/19 1339 11/15/19 0506 10/11/20 1740 10/11/20 2337 10/13/20 0324 10/15/20 1436  INR 1.3*   < > 1.3* 1.3* 1.2 1.0  APTT 30  --   --   --   --   --    < > = values in this interval not displayed.    BMP: Recent Labs    11/07/19 1454 11/15/19 0506 04/22/20 1232 05/07/20 1436 06/02/20 1126 08/27/20 1732 10/13/20 0324 10/14/20 0322 10/15/20 1436 10/20/20 0747 10/20/20 1241 10/20/20 1637 10/21/20 0424  NA 139 134*   < > 138 134*   < > 133* 134* 133* 137 137 136 134*  K 4.4 4.3   < > 4.5 4.5    < > 3.6 3.3* 3.8 4.9 4.4 4.2 4.2  CL 103 100   < > 101 99   < > 103 103 99 99  --  104 102  CO2 28 24   < > 25 26   < > 26 26 26   --   --  28 26  GLUCOSE  76 162*   < > 100 102*   < > 69* 60* 102 114*  --  128* 146*  BUN 52* 37*   < > 48* 52*   < > 35* 26* 40* 50*  --  44* 39*  CALCIUM 9.5 9.3   < > 9.6 9.4   < > 8.7* 8.6* 9.4  --   --  8.7* 8.5*  CREATININE 1.41* 1.37*   < > 1.53* 1.43*   < > 1.27* 1.27* 1.78* 1.80*  --  1.47* 1.45*  GFRNONAA 32* 33*   < > 29* 31*   < > 39* 39*  --   --   --  33* 33*  GFRAA 37* 38*  --  33* 36*  --   --   --   --   --   --   --   --    < > = values in this interval not displayed.    LIVER FUNCTION TESTS: Recent Labs    05/01/20 0635 05/07/20 1436 10/11/20 2337 10/13/20 0324 10/14/20 0322 10/15/20 1436  BILITOT 0.6   < > 1.0 1.2 0.6 0.5  AST 31   < > 23 22 23 28   ALT 20   < > 18 15 15 16   ALKPHOS 65  --  66 59 66  --   PROT 7.4   < > 6.9 6.1* 6.0* 6.8  ALBUMIN 3.2*  --  2.9* 2.5* 2.4*  --    < > = values in this interval not displayed.    Assessment and Plan:  87 y/o F with history of chronic pyloric stenosis s/p GJ placement >7 years ago per chart review who is well known to IR due to multiple GJ exchanges/evaluations who is currently admitted s/p left popliteal angioplasty and subsequent repair of right CFA 10/20/20 by vascular surgery. IR asked to evaluate GJ tube due to leaking. Last exchange in IR 10/19/20 (22 Fr GJ)  Per chart review patient has reported leaking from [place] tube since at least 2015, she has undergone multiple exchanges which have not seem to improve the leaking much. She reports daily leakage from the tube at home, sometimes soaking her clothing.  On exam tube feeds are infusing without obvious leakage, multiple pieces of split gauze between skin and bumper with some drainage which appears to be consistent with tube feeds. The tract is quite eroded but in good condition without any peristomal skin breakdown. Split gauze removed and  bumper cinched to skin without further leakage noted.   Leaking from [place] appears to be a chronic issue which has not been resolved with multiple exchanges (3 times this month), unfortunately due to the length of time the tube has been present there is erosion of the tract so that the tube does not encompass the entire width and thus likely is the cause of leakage during tube feed infusion. Additionally, the retention bumper was not fully cinched the skin which is likely also contributing to leakage.  Plan: - Continue to use GJ tube as needed - Do not place gauze between retention bumper and skin, ensure bumper cinched tightly to the skin to help with leakage - May place ostomy bag over insertion site to contain leakage for patient comfort - Keep insertion site clean and dry, there does not need to be a dressing over the insertion site  No plan for additional exchange at this time. Please call IR with any questions or concerns.  Electronically Signed: Larene Beach  Almira Bar, PA-C 10/21/2020, 2:41 PM   I spent a total of 15 Minutes at the the patient's bedside AND on the patient's hospital floor or unit, greater than 50% of which was counseling/coordinating care for leaking GJ.

## 2020-10-21 NOTE — Progress Notes (Signed)
Initial Nutrition Assessment  DOCUMENTATION CODES:   Non-severe (moderate) malnutrition in context of chronic illness  INTERVENTION:   Tube feeding:  -Osmolite 1.5 @ 75 ml/hr x 12 hrs (2000-0800) -ProSource TF 45 ml TID  Provides: 1470 kcals, 89 grams protein, 686 ml free water. Meets 92% of kcal and 100% of protein needs.   NUTRITION DIAGNOSIS:   Moderate Malnutrition related to chronic illness as evidenced by moderate fat depletion, severe muscle depletion.  GOAL:   Patient will meet greater than or equal to 90% of their needs  MONITOR:   Skin, TF tolerance, Weight trends, Labs, I & O's  REASON FOR ASSESSMENT:   Consult Enteral/tube feeding initiation and management  ASSESSMENT:   Patient with PMH significant for CAD, CHF, CKD III, COPD, gastric stenosis, HLD, HTN, PAD s/p toe amputations, paraesophageal hernia, perforated gastric ulcer, DM, and s/p recent PEG-J replacement. Presents this admission with left foot ulceration.  7/20- s/p aortogram, LLE angiogram, L popliteal angioplasty   Patient endorses using Osmolite 1.5 @ 70 ml/hr x 12 hrs per day at home. Takes clear and full liquids per report. Meals during the day consist of juice, milkshakes, cream soups, broth, and water. Currently tolerating Osmolite 1.2 @ 50 ml/hr via J-port. Will increase tube feeding rate and add protein modulars to maximize kcal and protein post op.  Patient reports a UBW of 120 lb and is unsure of recent weight loss. Records indicate patient's weight has fluctuated between 54.9-60.7 kg over the last year.  Suspect some dry weight loss but unable to quantify given history of CHF.   Medications: reviewed  Labs: Na 134 (L) CBG 132-163  NUTRITION - FOCUSED PHYSICAL EXAM:  Flowsheet Row Most Recent Value  Orbital Region Mild depletion  Upper Arm Region Moderate depletion  Thoracic and Lumbar Region Unable to assess  Buccal Region Moderate depletion  Temple Region Severe depletion   Clavicle Bone Region Moderate depletion  Clavicle and Acromion Bone Region Severe depletion  Scapular Bone Region Unable to assess  Dorsal Hand Severe depletion  Patellar Region Severe depletion  Anterior Thigh Region Severe depletion  Posterior Calf Region Severe depletion  Edema (RD Assessment) Mild  Hair Reviewed  Eyes Reviewed  Mouth Reviewed  Skin Reviewed  Nails Reviewed      Diet Order:   Diet Order             Diet clear liquid Room service appropriate? Yes with Assist; Fluid consistency: Thin  Diet effective now                   EDUCATION NEEDS:   Not appropriate for education at this time  Skin:  Skin Assessment: Skin Integrity Issues: Skin Integrity Issues:: Diabetic Ulcer Diabetic Ulcer: bilateral lower extremities  Last BM:  7/12  Height:   Ht Readings from Last 1 Encounters:  10/20/20 5\' 6"  (1.676 m)    Weight:   Wt Readings from Last 1 Encounters:  10/20/20 54.9 kg    BMI:  Body mass index is 19.53 kg/m.  Estimated Nutritional Needs:   Kcal:  1600-1800 kcal  Protein:  85-100 grams  Fluid:  >/= 1.7 L/day  Mariana Single MS, RD, LDN, CNSC Clinical Nutrition Pager listed in [place]

## 2020-10-21 NOTE — Progress Notes (Addendum)
  Progress Note    10/21/2020 7:42 AM 1 Day Post-Op  Subjective:  Feeling better this morning   Vitals:   10/21/20 0413 10/21/20 0724  BP: (!) 135/51 (!) 134/52  Pulse: 78 79  Resp: 20 17  Temp: 98.6 F (37 C) 98 F (36.7 C)  SpO2: 100% 100%   Physical Exam: Lungs:  non labored Incisions:  R groin incision c/d/i Extremities:  L PT brisk by doppler; R ATA brisk by doppler Abdomen:  soft Neurologic: A&O  CBC    Component Value Date/Time   WBC 5.8 10/21/2020 0424   RBC 2.30 (L) 10/21/2020 0424   HGB 6.8 (LL) 10/21/2020 0424   HGB 9.6 (L) 01/01/2019 1404   HCT 20.2 (L) 10/21/2020 0424   HCT 30.0 (L) 01/01/2019 1404   PLT 183 10/21/2020 0424   PLT 243 01/01/2019 1404   MCV 87.8 10/21/2020 0424   MCV 93 01/01/2019 1404   MCH 29.6 10/21/2020 0424   MCHC 33.7 10/21/2020 0424   RDW 19.8 (H) 10/21/2020 0424   RDW 14.1 01/01/2019 1404   LYMPHSABS 911 10/15/2020 1436   LYMPHSABS 1.3 08/07/2017 1119   MONOABS 0.6 10/13/2020 0324   EOSABS 228 10/15/2020 1436   EOSABS 0.4 08/07/2017 1119   BASOSABS 21 10/15/2020 1436   BASOSABS 0.1 08/07/2017 1119    BMET    Component Value Date/Time   NA 134 (L) 10/21/2020 0424   NA 137 01/15/2019 1313   K 4.2 10/21/2020 0424   CL 102 10/21/2020 0424   CO2 26 10/21/2020 0424   GLUCOSE 146 (H) 10/21/2020 0424   BUN 39 (H) 10/21/2020 0424   BUN 58 (H) 01/15/2019 1313   CREATININE 1.45 (H) 10/21/2020 0424   CREATININE 1.78 (H) 10/15/2020 1436   CALCIUM 8.5 (L) 10/21/2020 0424   GFRNONAA 33 (L) 10/21/2020 0424   GFRNONAA 31 (L) 06/02/2020 1126   GFRAA 36 (L) 06/02/2020 1126    INR    Component Value Date/Time   INR 1.0 10/15/2020 1436     Intake/Output Summary (Last 24 hours) at 10/21/2020 8469 Last data filed at 10/20/2020 2346 Gross per 24 hour  Intake 1970 ml  Output 20 ml  Net 1950 ml     Assessment/Plan:  87 y.o. female is s/p L popliteal angioplasty and subsequent repair of R CFA in OR 1 Day Post-Op   BLE  well perfused by doppler exam Hgb 6.8 this morning; 1 unit pRBCs have been ordered; check post transfusion CBC OOB after transfusion this morning Possibly home tomorrow   Dagoberto Ligas, PA-C [facility] [phone] 10/21/2020 7:42 AM  VASCULAR STAFF ADDENDUM: I have independently interviewed and examined the patient. I agree with the above.  1u PRBC for acute blood loss anemia PT / OT / OOB / Ambulate  [name]. Stanford Breed, MD [facility] Phone Number: [phone] 10/21/2020 9:30 AM

## 2020-10-21 NOTE — Progress Notes (Signed)
Pt had a hemoglobin level of 6.8.  The nurse informed the on call vascular surgeon, Dr. Donzetta Matters, who ordered to give the patient one Unit of RBCs.  Will continue to monitor.  Lupita Dawn, RN

## 2020-10-22 ENCOUNTER — Other Ambulatory Visit (HOSPITAL_COMMUNITY): Payer: Self-pay

## 2020-10-22 LAB — CBC
HCT: 26.3 % — ABNORMAL LOW (ref 36.0–46.0)
Hemoglobin: 8.8 g/dL — ABNORMAL LOW (ref 12.0–15.0)
MCH: 29.2 pg (ref 26.0–34.0)
MCHC: 33.5 g/dL (ref 30.0–36.0)
MCV: 87.4 fL (ref 80.0–100.0)
Platelets: 182 10*3/uL (ref 150–400)
RBC: 3.01 MIL/uL — ABNORMAL LOW (ref 3.87–5.11)
RDW: 19.2 % — ABNORMAL HIGH (ref 11.5–15.5)
WBC: 6.4 10*3/uL (ref 4.0–10.5)
nRBC: 0 % (ref 0.0–0.2)

## 2020-10-22 LAB — GLUCOSE, CAPILLARY
Glucose-Capillary: 104 mg/dL — ABNORMAL HIGH (ref 70–99)
Glucose-Capillary: 120 mg/dL — ABNORMAL HIGH (ref 70–99)
Glucose-Capillary: 158 mg/dL — ABNORMAL HIGH (ref 70–99)
Glucose-Capillary: 172 mg/dL — ABNORMAL HIGH (ref 70–99)
Glucose-Capillary: 191 mg/dL — ABNORMAL HIGH (ref 70–99)

## 2020-10-22 MED ORDER — SENNOSIDES-DOCUSATE SODIUM 8.6-50 MG PO TABS
1.0000 | ORAL_TABLET | Freq: Two times a day (BID) | ORAL | Status: DC
  Administered 2020-10-22: 1 via ORAL
  Filled 2020-10-22: qty 1

## 2020-10-22 MED ORDER — TRAMADOL HCL 50 MG PO TABS: 50.0000 mg | ORAL_TABLET | Freq: Four times a day (QID) | ORAL | 0 refills | Status: DC | PRN

## 2020-10-22 NOTE — Consult Note (Signed)
[facility] Inpatient Consult   10/22/2020  DENNELL SATTERWHITE 04/20/1923 [id number]  [facility] [[name]]  Accountable Care Organization [ACO] Patient: Medicare CMS DCE  Scheduled surgery: Left Popliteal angioplasy  Patient is currently active with [facility] [[name]] Care Management for chronic disease management services.  Patient has been engaged by a [facility].  Our community based plan of care has focused on disease management and community resource support.    Plan: Continue to follow for progress and disposition and for transitional care needs.Will alert [facility] RN CCM of additional follow up needs.  Of note, [facility] Care Management services does not replace or interfere with any services that are needed or arranged by inpatient [facility] care management team.  For additional questions or referrals please contact:   Charlesetta Shanks, RN BSN CCM [facility]  [phone] business mobile phone Toll free office [phone]  Fax number: [phone] [email or web address]

## 2020-10-22 NOTE — TOC Initial Note (Signed)
Transition of Care [facility]) - Initial/Assessment Note    Patient Details  Name: Shannon [name] MRN: [id number] Date of Birth: [birth year]  Transition of Care [facility]) CM/SW Contact:    Gala Lewandowsky, RN Phone Number: 10/22/2020, 3:14 PM  Clinical Narrative: Risk for readmission   assessment completed. Prior to arrival patient was from home with daughter. Case Manager spoke with patient regarding home health needs and the daughter is at the bedside. The patient has declined home health services. Daughter is aware to call the patients primary care provider if she needs services in the future. Daughter states that the patient takes her medications without any issues and  she gets to appointments without issues. Daughter will provide patient with transportation home.     Expected Discharge Plan: Home/Self Care Barriers to Discharge: No Barriers Identified   Patient Goals and CMS Choice Patient states their goals for this hospitalization and ongoing recovery are:: to return home with daughter.   Choice offered to / list presented to : NA  Expected Discharge Plan and Services Expected Discharge Plan: Home/Self Care In-house Referral: NA Discharge Planning Services: CM Consult Post Acute Care Choice: NA Living arrangements for the past 2 months: Single Family Home Expected Discharge Date: 10/22/20               DME Arranged: N/A DME Agency: NA       HH Arranged: Patient Refused HH          Prior Living Arrangements/Services Living arrangements for the past 2 months: Single Family Home Lives with:: Adult Children Patient language and need for interpreter reviewed:: Yes Do you feel safe going back to the place where you live?: Yes      Need for Family Participation in Patient Care: Yes (Comment) Care giver support system in place?: Yes (comment) Current home services: DME (Pt has a rolling walker and a bedside commode.)    Emotional Assessment Appearance:: Appears  stated age Attitude/Demeanor/Rapport: Engaged Affect (typically observed): Appropriate Orientation: : Oriented to  Time, Oriented to Place, Oriented to Self Alcohol / Substance Use: Not Applicable Psych Involvement: No (comment)  Admission diagnosis:  PAD (peripheral artery disease) (HCC) [I73.9] Patient Active Problem List   Diagnosis Date Noted   UGI bleed 10/11/2020   Depression, major, single episode, complete remission (HCC) 06/09/2020   History of transmetatarsal amputation of right foot (HCC) 11/14/2019   Chest pain 08/27/2019   Lab test positive for detection of COVID-19 virus 08/27/2019   Acute encephalopathy 07/07/2019   AMS (altered mental status) 07/07/2019   Varicose veins of right lower extremity with ulcer other part of foot (HCC) 06/11/2019   Jejunostomy tube fell out 03/02/2019   S/P amputation of lesser toe, left (HCC) 02/13/2019   BRBPR (bright red blood per rectum) 01/10/2019   GI bleed 12/25/2018   Bradycardia 11/25/2018   Amputated great toe of right foot (HCC) 05/06/2018   Peripheral vascular disease (HCC) 05/03/2018   Localized, primary osteoarthritis of shoulder region 01/21/2018   PAD (peripheral artery disease) (HCC) 06/27/2017   AKI (acute kidney injury) (HCC) 02/20/2017   Complication of feeding tube (HCC) 02/20/2017   PEG tube malfunction (HCC)    COPD with asthma (HCC) 10/03/2016   Chronic deep vein thrombosis (DVT) of tibial vein of left lower extremity (HCC) 10/03/2016   Gangrene of toe of left foot (HCC) 09/15/2016   Recurrent aspiration pneumonia (HCC)    Atherosclerosis of native arteries of the extremities with gangrene (HCC) 07/28/2016  Other specified hypothyroidism    CHF (NYHA class IV, ACC/AHA stage D) (HCC)    Shortness of breath    Pressure injury of skin 03/11/2016   PEG (percutaneous endoscopic gastrostomy) status (HCC) 03/10/2016   Encounter for therapeutic drug monitoring 02/16/2016   Warfarin anticoagulation    Chronic  seasonal allergic rhinitis    Atrial fibrillation and flutter (HCC) 01/24/2016   Chronic diastolic CHF (congestive heart failure) (HCC) 01/24/2016   Bilateral leg edema 12/29/2015   Hypothyroidism due to medication    Weakness    Pyloric stenosis    Pressure ulcer 09/15/2015   PVD (peripheral vascular disease) (HCC)    Diabetic ulcer of toe of right foot associated with type 2 diabetes mellitus, with necrosis of muscle (HCC)    Diverticulitis of large intestine without perforation or abscess without bleeding    Colitis 08/26/2015   Esophageal candidiasis (HCC)    SOB (shortness of breath)    Acute renal failure superimposed on stage 4 chronic kidney disease (HCC)    Encounter for long-term (current) use of other high-risk medications    Dysphagia 07/05/2015   Asthma with allergic rhinitis 06/04/2015   Hypertensive heart disease 03/18/2015   Malnutrition of moderate degree 01/28/2015   Syncope 01/27/2015   Symptomatic bradycardia 01/21/2015   Hypotension due to drugs 01/21/2015   Protein-calorie malnutrition, severe 01/12/2015   Hypertension associated with diabetes (HCC) 01/12/2015   Cerebrovascular disease 07/14/2014   Diabetic polyneuropathy associated with type 2 diabetes mellitus (HCC) 07/14/2014   Hyperlipidemia 07/14/2014   Gastric outlet obstruction 05/21/2014   Unstable gait 04/29/2014   Slurred speech    Gastroparesis    Constipation 02/03/2014   Carotid stenosis 02/03/2014   Demand ischemia (HCC) 01/21/2014   Gastric bezoar 01/20/2014   Anemia of chronic disease 01/18/2014   CKD (chronic kidney disease) stage 3, GFR 30-59 ml/min (HCC) 01/18/2014   Depression 09/29/2013   Essential hypertension, benign 09/22/2013   GERD (gastroesophageal reflux disease) 09/22/2013   Hyperlipidemia associated with type 2 diabetes mellitus (HCC) 09/22/2013   Type 2 diabetes mellitus with chronic kidney disease (HCC) 09/22/2013   Persistent atrial fibrillation 09/22/2013   History of  completed stroke 09/22/2013   Diabetic neuropathy (HCC) 09/22/2013   Hypothyroidism 09/22/2013   Chronic gastrojejunal ulcer with perforation (HCC) 09/07/2013   Accelerated hypertension 09/07/2013   Other specified abnormal immunological findings in serum 09/07/2013   Red blood cell antibody positive 09/07/2013   PCP:  Sharon Seller, NP Pharmacy:   [facility] [phone] - [address] [place] [address] [place] [address] Phone: [phone] Fax: [phone]  [facility] - [place], [place] - [address] [place] [address] Phone: [phone] Fax: [phone]  [facility] [address] [place] [address] Phone: [phone] Fax: [phone]   Readmission Risk Interventions Readmission Risk Prevention Plan 10/22/2020  Transportation Screening Complete  Medication Review (RN Care Manager) Complete  HRI or Home Care Consult Complete  SW Recovery Care/Counseling Consult Complete  Palliative Care Screening Not Applicable  Skilled Nursing Facility Not Applicable  Some recent data might be hidden

## 2020-10-22 NOTE — Evaluation (Signed)
Physical Therapy Evaluation Patient Details Name: Shannon [name] MRN: [id number] DOB: Apr 27, 1923 Today's Date: 10/22/2020   History of Present Illness  Shannon [name] is a 87 y.o. female status post right groin exploration with common femoral artery repair for hematoma with hypotension after LLE angiography / angioplasty.   Clinical Impression  Patient received in bed, agreeable to mobility assessment. Patient reports pain in right groin with movement in bed. She performed supine to sit with supervision and use of bed rails. She is able to sit to stand with min guard and pivoted around to recliner with min guard and assist for lines. Patient will continue to benefit from skilled PT while here to improve functional mobility and independence for safe return home with daughter.      Follow Up Recommendations Home health PT;Supervision for mobility/OOB    Equipment Recommendations  None recommended by PT    Recommendations for Other Services       Precautions / Restrictions Precautions Precautions: Fall Precaution Comments: high fall Restrictions Weight Bearing Restrictions: No      Mobility  Bed Mobility Overal bed mobility: Needs Assistance Bed Mobility: Supine to Sit     Supine to sit: Supervision     General bed mobility comments: use of bed rails, supervision    Transfers Overall transfer level: Needs assistance Equipment used: None Transfers: Sit to/from Stand;Stand Pivot Transfers Sit to Stand: Min guard Stand pivot transfers: Min guard       General transfer comment: required B UE support during transfer, but is generally safe and steady in standing  Ambulation/Gait             General Gait Details: deferred  Stairs            Wheelchair Mobility    Modified Rankin (Stroke Patients Only)       Balance Overall balance assessment: Needs assistance Sitting-balance support: Feet supported Sitting balance-Leahy Scale: Good     Standing  balance support: Bilateral upper extremity supported;During functional activity Standing balance-Leahy Scale: Fair Standing balance comment: needs B UE support for standing                             Pertinent Vitals/Pain Pain Assessment: Faces Faces Pain Scale: Hurts little more Pain Location: R groin with mobility Pain Descriptors / Indicators: Grimacing;Guarding;Discomfort;Sore Pain Intervention(s): Monitored during session;Repositioned    Home Living Family/patient expects to be discharged to:: Private residence Living Arrangements: Children Available Help at Discharge: Family;Available 24 hours/day Type of Home: House Home Access: Ramped entrance     Home Layout: One level Home Equipment: Walker - 4 wheels;Bedside commode;Tub bench;Hand held shower head      Prior Function Level of Independence: Needs assistance   Gait / Transfers Assistance Needed: rollator for short ambulation distances   ADL's / Homemaking Assistance Needed: pt reorts independence with ADLs, daughter assists with medication managmenet, shower transfers, and cooking        Hand Dominance   Dominant Hand: Right    Extremity/Trunk Assessment   Upper Extremity Assessment Upper Extremity Assessment: Generalized weakness    Lower Extremity Assessment Lower Extremity Assessment: Generalized weakness    Cervical / Trunk Assessment Cervical / Trunk Assessment: Kyphotic  Communication   Communication: HOH  Cognition Arousal/Alertness: Awake/alert Behavior During Therapy: WFL for tasks assessed/performed Overall Cognitive Status: Within Functional Limits for tasks assessed  General Comments      Exercises     Assessment/Plan    PT Assessment Patient needs continued PT services  PT Problem List Decreased strength;Decreased mobility;Decreased activity tolerance;Pain       PT Treatment Interventions DME instruction;Gait  training;Therapeutic exercise;Functional mobility training;Therapeutic activities;Patient/family education    PT Goals (Current goals can be found in the Care Plan section)  Acute Rehab PT Goals Patient Stated Goal: to return home with daughter PT Goal Formulation: With patient Time For Goal Achievement: 10/29/20 Potential to Achieve Goals: Good    Frequency Min 3X/week   Barriers to discharge        Co-evaluation               AM-PAC PT "6 Clicks" Mobility  Outcome Measure Help needed turning from your back to your side while in a flat bed without using bedrails?: A Little Help needed moving from lying on your back to sitting on the side of a flat bed without using bedrails?: A Little Help needed moving to and from a bed to a chair (including a wheelchair)?: A Little Help needed standing up from a chair using your arms (e.g., wheelchair or bedside chair)?: A Little Help needed to walk in hospital room?: A Lot Help needed climbing 3-5 steps with a railing? : A Lot 6 Click Score: 16    End of Session Equipment Utilized During Treatment: Gait belt Activity Tolerance: Patient limited by fatigue Patient left: in chair;with call bell/phone within reach;with chair alarm set Nurse Communication: Mobility status PT Visit Diagnosis: Other abnormalities of gait and mobility (R26.89);Muscle weakness (generalized) (M62.81);Difficulty in walking, not elsewhere classified (R26.2);Pain Pain - Right/Left: Right Pain - part of body:  (groin)    Time: 5638-7564 PT Time Calculation (min) (ACUTE ONLY): 15 min   Charges:   PT Evaluation $PT Eval Moderate Complexity: 1 Mod          [employer], PT, GCS 10/22/20,12:23 PM

## 2020-10-22 NOTE — Progress Notes (Signed)
D/C instructions given to patient and daughter. Wound care and medications reviewed. All questions answered. Daughter to escort pt home.  Clyde Canterbury, RN

## 2020-10-22 NOTE — Progress Notes (Addendum)
[facility]  Subjective  - Awake and oriented.  Feels bloated, passing flatus.   Objective (!) 156/64 76 99 F (37.2 C) (Oral) 17 100%  Intake/Output Summary (Last 24 hours) at 10/22/2020 0719 Last data filed at 10/22/2020 3244 Gross per 24 hour  Intake 1596 ml  Output 1100 ml  Net 496 ml    B feet warm right AT/peroneal signal monophasic, left PT/peroneal monophasic signals Feet protected with heel dressings Right groin soft and incision dry Lungs non labored breathing  Assessment/Planning: POD # 2 right CFA repair, prior L popliteal angioplasty 10/20/20  Pending CBC this am s/p transfusion of 1 unit PRBC for blood loss anemia. [place] ordered for bloating patient takes at home BID Radiology consult for GJ tube leak. A new, 22 French gastrojejunostomy tube was then inserted. Plan: - Continue to use GJ tube as needed - Do not place gauze between retention bumper and skin, ensure bumper cinched tightly to the skin to help with leakage - May place ostomy bag over insertion site to contain leakage for patient comfort - Keep insertion site clean and dry, there does not need to be a dressing over the insertion site  HGB improved now 8.8 after 1 unit transfused. Dr. Stanford Breed states she is stable for discharge   Roxy Horseman 10/22/2020 7:19 AM --  Laboratory Lab Results: Recent Labs    10/20/20 1637 10/21/20 0424  WBC 6.7 5.8  HGB 7.1* 6.8*  HCT 22.5* 20.2*  PLT 199 183   BMET Recent Labs    10/20/20 1637 10/21/20 0424  NA 136 134*  K 4.2 4.2  CL 104 102  CO2 28 26  GLUCOSE 128* 146*  BUN 44* 39*  CREATININE 1.47* 1.45*  CALCIUM 8.7* 8.5*    COAG Lab Results  Component Value Date   INR 1.0 10/15/2020   INR 1.2 10/13/2020   INR 1.3 (H) 10/11/2020   No results found for: PTT

## 2020-10-23 ENCOUNTER — Other Ambulatory Visit: Payer: Self-pay | Admitting: Adult Health

## 2020-10-25 ENCOUNTER — Other Ambulatory Visit: Payer: Self-pay | Admitting: *Deleted

## 2020-10-25 ENCOUNTER — Encounter: Payer: Medicare Other | Admitting: Podiatry

## 2020-10-25 LAB — TYPE AND SCREEN
ABO/RH(D): AB POS
Antibody Screen: POSITIVE
DAT, IgG: NEGATIVE
Donor AG Type: NEGATIVE
Donor AG Type: NEGATIVE
Donor AG Type: NEGATIVE
Donor AG Type: NEGATIVE
Donor AG Type: NEGATIVE
Unit division: 0
Unit division: 0
Unit division: 0
Unit division: 0
Unit division: 0
Unit division: 0

## 2020-10-25 LAB — BPAM RBC
Blood Product Expiration Date: 202208062359
Blood Product Expiration Date: 202208062359
Blood Product Expiration Date: 202208072359
Blood Product Expiration Date: 202208112359
Blood Product Expiration Date: 202208142359
Blood Product Expiration Date: 202208262359
ISSUE DATE / TIME: 202207192348
ISSUE DATE / TIME: 202207201237
ISSUE DATE / TIME: 202207201237
ISSUE DATE / TIME: 202207211057
Unit Type and Rh: 600
Unit Type and Rh: 600
Unit Type and Rh: 8400
Unit Type and Rh: 8400
Unit Type and Rh: 9500
Unit Type and Rh: 9500

## 2020-10-25 NOTE — Patient Outreach (Signed)
[facility]) Care Management  10/25/2020  Shannon [name] 08-03-1923 [id number]   Outgoing call placed to member, state she is doing "pretty good" since discharge.  No further bleeding or complications from feeding tube.  Hopeful she will not require further amputation, will have more information after upcoming appointments.  Denies any urgent concerns, encouraged to contact this care manager with questions.  Agrees to follow up within the next month.   Goals Addressed             This Visit's Progress    [name] - Make and Keep All Appointments   On track      Timeframe:  Short-Term Goal Priority:  Medium Start Date:       5/4           Expected End Date:     7/29  Barriers: Knowledge                  - ask family or friend for a ride - call to cancel if needed - keep a calendar with appointment dates    Why is this important?   Part of staying healthy is seeing the doctor for follow-up care.  If you forget your appointments, there are some things you can do to stay on track.    Notes:   11/22 - Upcoming appointments reviewed with member  12/21 - reminded of follow up with podiatrist  2/7 - Reviewed upcoming appointments with member, PCP on 3/9, podiatrist on 3/16  4/6 - Encouraged to follow up with GI regarding feeding tube and test results  5/4 - Continues to have issues with feet, particularly left foot bunion.  Appointment with podiatrist on 5/18, will continue to use cream and contact provider if in need of sooner visit  6/7 - follow up with podiatrist tomorrow and 6/22.  Still has hardened area on right foot and cellulitis on left leg/foot.  Appointment with PCP on 7/11  6/29 - attended 6/22 podiatry appointment, will need another surgery.  PCP appointment 6/23 clearing her for surgery.  Awaiting call from vascular specialist for appointment prior to surgery date   7/15 - Was seen by vascular surgeon on 7/5, will have intervention next week.   Hospital follow up with PCP later today.  7/25 - Upcoming visits with PCP on 8/8, Podiatry on 8/15, and vascular on 8/16.      [name] - Perform Foot Care   On track    Barriers: Knowledge   Timeframe:  Long-Range Goal Priority:  High Start Date:       6/7              Expected End Date:    9/7          - check feet daily for cuts, sores or redness - wash and dry feet carefully every day - wear comfortable, cotton socks - wear comfortable, well-fitting shoes    Why is this important?   Good foot care is very important when you have diabetes.  There are many things you can do to keep your feet healthy and catch a problem early.    Notes:   11/22 - Reviewed updated wound care per most recent MD visit  12/21 - Discussed ongoing wound care with member  2/7 - Reminded of wound care per podiatrist, encouraged to keep wound clean and dry  6/7 - Seen in ED for cellulitis of left foot/leg, completed course of antibiotics, still has swelling.  6/29 - Patient is now needing transmetatarsal removal of the left foot due to non-healing wound and infection.  Surgery scheduled for 7/8  7/15 - Amputation surgery has been postponed, will now have vascular intervention, scheduled for 7/20.  Member denies questions regarding procedure  7/25 - Vascular procedure completed in effort to not have further amputation.  Will continue daily foot care and follow up       Kemper Durie, RN, MSN [facility] Care Management  [facility] Manager [phone]

## 2020-10-25 NOTE — Discharge Summary (Signed)
[facility] Discharge Summary   Patient ID:  Shannon [name] MRN: [id number] DOB/AGE: [id number] y.o.  Admit date: 10/20/2020 Discharge date: 10/22/20 Date of Surgery: 10/20/2020 Surgeon: Surgeon(s): Leonie Douglas, MD  Admission Diagnosis: PAD (peripheral artery disease) (HCC) [I73.9]  Discharge Diagnoses:  PAD (peripheral artery disease) (HCC) [I73.9]  Secondary Diagnoses: Past Medical History:  Diagnosis Date   Acute respiratory failure (HCC)    Anemia    previous blood transfusions   Arthritis    "all over"   Asthma    Atrial flutter (HCC)    Bradycardia    requiring previous d/c of BB and reduction of amiodarone   CAD (coronary artery disease)    nonobstructive per notes   Chronic diastolic CHF (congestive heart failure) (HCC)    CKD (chronic kidney disease), stage III (HCC)    Complication of blood transfusion    "got the wrong blood type at [facility] in ~ 2015; no adverse reaction that we are aware of"/daughter, [name] (01/27/2016)   COPD (chronic obstructive pulmonary disease) (HCC)    Depression    "light case"   DVT (deep venous thrombosis) (HCC) 01/2016   a. LLE DVT 01/2016 - switched from Eliquis to Coumadin.   Dyspnea    with some activity   Gastric stenosis    a. s/p stomach tube   GERD (gastroesophageal reflux disease)    Headache    History of 2019 novel coronavirus disease (COVID-19)    History of blood transfusion    "several" (01/27/2016)   History of stomach ulcers    Hyperlipidemia    Hypertension    Hypothyroidism    On home oxygen therapy    "2L; 9PM til 9AM" (06/27/2017)   PAD (peripheral artery disease) (HCC)    a. prior gangrene, toe amputations, intervention.   PAF (paroxysmal atrial fibrillation) (HCC)    Paraesophageal hernia    Perforated gastric ulcer (HCC)    Pneumonia    "a few times" (06/27/2017)   Seasonal allergies    SIADH (syndrome of inappropriate ADH production) (HCC)    [name] 01/10/2015   Small  bowel obstruction (HCC)    "I don't know how many" (01/11/2015)   Stroke (HCC)    several- no residual   Type II diabetes mellitus (HCC)    "related to prednisone use  for > 20 yr; once predinose stopped; no more DM RX" (01/27/2016)   UTI (urinary tract infection) 02/08/2016   Ventral hernia with bowel obstruction     Procedure(s): PRIMARY CLOSURE COMMON FEMORAL ARTERY EVACUATION HEMATOMA RIGHT GROIN  Discharged Condition: stable  HPI: Shannon [name] is a 87 y.o. female with atherosclerosis of native arteries of bilateral lower extremities causing ulceration.  Plan left lower extremity angiogram with possible intervention via right common femoral artery approach in cath lab 10/13/20. Will need to delay TMA until revascularization can be done.  Left GT ulcer.  Plan for podiatry to perform TMA in the near future.   Hospital Course:  Shannon [name] is a 87 y.o. female is S/P  Procedure(s): PRIMARY CLOSURE COMMON FEMORAL ARTERY EVACUATION HEMATOMA RIGHT GROIN  left popliteal angioplasty (4x170mm [place]ang) Extremities:  L PT brisk by doppler; R ATA brisk by doppler Hgb 6.8 this morning; 1 unit pRBCs have been ordered; check post transfusion CBC.   Radiology consult for GJ tube leak. A new, 22 French gastrojejunostomy tube was then inserted. Plan: - Continue to use GJ tube as needed - Do not  place gauze between retention bumper and skin, ensure bumper cinched tightly to the skin to help with leakage - May place ostomy bag over insertion site to contain leakage for patient comfort - Keep insertion site clean and dry, there does not need to be a dressing over the insertion site   HGB improved now 8.8 after 1 unit transfused. Dr. Lenell Antu states she is stable for discharge  Significant Diagnostic Studies: CBC Lab Results  Component Value Date   WBC 6.4 10/22/2020   HGB 8.8 (L) 10/22/2020   HCT 26.3 (L) 10/22/2020   MCV 87.4 10/22/2020   PLT 182 10/22/2020    BMET    Component Value  Date/Time   NA 134 (L) 10/21/2020 0424   NA 137 01/15/2019 1313   K 4.2 10/21/2020 0424   CL 102 10/21/2020 0424   CO2 26 10/21/2020 0424   GLUCOSE 146 (H) 10/21/2020 0424   BUN 39 (H) 10/21/2020 0424   BUN 58 (H) 01/15/2019 1313   CREATININE 1.45 (H) 10/21/2020 0424   CREATININE 1.78 (H) 10/15/2020 1436   CALCIUM 8.5 (L) 10/21/2020 0424   GFRNONAA 33 (L) 10/21/2020 0424   GFRNONAA 31 (L) 06/02/2020 1126   GFRAA 36 (L) 06/02/2020 1126   COAG Lab Results  Component Value Date   INR 1.0 10/15/2020   INR 1.2 10/13/2020   INR 1.3 (H) 10/11/2020     Disposition:  Discharge to :Home Discharge Instructions     Call MD for:  redness, tenderness, or signs of infection (pain, swelling, bleeding, redness, odor or green/yellow discharge around incision site)   Complete by: As directed    Call MD for:  redness, tenderness, or signs of infection (pain, swelling, bleeding, redness, odor or green/yellow discharge around incision site)   Complete by: As directed    Call MD for:  severe or increased pain, loss or decreased feeling  in affected limb(s)   Complete by: As directed    Call MD for:  severe or increased pain, loss or decreased feeling  in affected limb(s)   Complete by: As directed    Call MD for:  temperature >100.5   Complete by: As directed    Call MD for:  temperature >100.5   Complete by: As directed    Increase activity slowly   Complete by: As directed    Walk with assistance use walker or cane as needed   Remove dressing in 24 hours   Complete by: As directed    Resume previous diet   Complete by: As directed    Resume previous diet   Complete by: As directed       Allergies as of 10/22/2020       Reactions   Penicillins Itching, Rash, Other (See Comments)   Tolerated amoxicillin, unasyn, zosyn & cephalosporins in the past. Did it involve swelling of the face/tongue/throat, SOB, or low BP? No Did it involve sudden or severe rash/hives, skin peeling, or any  reaction on the inside of your mouth or nose? No Did you need to seek medical attention at a hospital or doctor's office? Unknown When did it last happen?      5+ years If all above answers are "NO", may proceed with cephalosporin use.        Medication List     TAKE these medications    acetaminophen 500 MG tablet Commonly known as: TYLENOL Take 500 mg by mouth every 6 (six) hours as needed for moderate pain.  amLODipine 5 MG tablet Commonly known as: NORVASC Take 1 tablet (5 mg total) by mouth daily.   Aspirin Low Dose 81 MG EC tablet Generic drug: aspirin Take 1 tablet (81 mg total) by mouth daily. Swallow whole.   atorvastatin 10 MG tablet Commonly known as: LIPITOR TAKE 1 TABLET BY MOUTH EVERY DAY   b complex vitamins tablet Take 1 tablet by mouth daily.   cholecalciferol 25 MCG (1000 UNIT) tablet Commonly known as: VITAMIN D3 Take 1,000 Units by mouth daily.   clopidogrel 75 MG tablet Commonly known as: Plavix Take 1 tablet (75 mg total) by mouth daily.   cycloSPORINE 0.05 % ophthalmic emulsion Commonly known as: Restasis USE 1 DROP INTO BOTH EYES TWICE DAILY   Dulera 200-5 MCG/ACT Aero Generic drug: mometasone-formoterol INHALE TWO PUFFS BY MOUTH TWICE DAILY   DULoxetine 30 MG capsule Commonly known as: CYMBALTA TAKE 1 CAPSULE BY MOUTH EVERY DAY   EQL PROBIOTIC COLON SUPPORT PO Take 1 capsule by mouth daily.   feeding supplement (OSMOLITE 1.5 CAL) Liqd Place 711 mLs into feeding tube See admin instructions. Take 711 ml over a 12 hour period per day 9 am-9 pm at a rate of 65   ferrous sulfate 220 (44 Fe) MG/5ML solution TAKE 7.4 MLS (325 MG TOTAL) BY MOUTH DAILY.   fluticasone 50 MCG/ACT nasal spray Commonly known as: FLONASE SPRAY 2 SPRAYS INTO EACH NOSTRIL EVERY DAY   free water Soln Place 180 mLs into feeding tube 3 (three) times daily.   furosemide 40 MG tablet Commonly known as: LASIX TAKE 0.5-1 TABLETS (20-40 MG TOTAL) BY MOUTH SEE  ADMIN INSTRUCTIONS. TAKE 20 MG DAILY, MAY INSTEAD TAKE 40 MG DAILY AS NEEDED FOR EDEMA   gabapentin 300 MG capsule Commonly known as: NEURONTIN Take 1 capsule (300 mg total) by mouth 2 (two) times daily.   gentamicin cream 0.1 % Commonly known as: GARAMYCIN Apply 1 application topically daily as needed (dry skin).   hydrALAZINE 50 MG tablet Commonly known as: APRESOLINE TAKE 1 TABLET BY MOUTH THREE TIMES A DAY   Insulin Pen Needle 31G X 5 MM Misc Commonly known as: B-D UF III MINI PEN NEEDLES Use twice daily with giving insulin injections. Dx E11.9   ipratropium-albuterol 0.5-2.5 (3) MG/3ML Soln Commonly known as: DUONEB Take 3 mLs by nebulization every 6 (six) hours as needed (shortness of breath).   isosorbide mononitrate 30 MG 24 hr tablet Commonly known as: IMDUR TAKE 2 TABLETS BY MOUTH DAILY.   latanoprost 0.005 % ophthalmic solution Commonly known as: XALATAN Place 1 drop into both eyes at bedtime.   levalbuterol 45 MCG/ACT inhaler Commonly known as: XOPENEX HFA Inhale 2 puffs into the lungs every 6 (six) hours as needed for wheezing.   levothyroxine 125 MCG tablet Commonly known as: SYNTHROID TAKE 1 TABLET BY MOUTH EVERY DAY   loratadine 10 MG tablet Commonly known as: CLARITIN Take 10 mg by mouth daily.   multivitamin with minerals Tabs tablet Take 1 tablet by mouth daily.   nystatin 100000 UNIT/ML suspension Commonly known as: MYCOSTATIN Take 5 mLs (500,000 Units total) by mouth 4 (four) times daily.   nystatin powder Commonly known as: MYCOSTATIN/NYSTOP Apply 1 application topically as needed (skin irritation).   nystatin cream Commonly known as: MYCOSTATIN APPLY TO AFFECTED AREA TWICE A DAY   oxyCODONE 5 MG immediate release tablet Commonly known as: Roxicodone Take 0.5-1 tablets (2.5-5 mg total) by mouth every 8 (eight) hours as needed for severe pain.  pantoprazole 40 MG tablet Commonly known as: PROTONIX Take 1 tablet (40 mg total) by  mouth 2 (two) times daily.   senna-docusate 8.6-50 MG tablet Commonly known as: Senokot-S Take 1 tablet by mouth as needed for mild constipation.   shark liver oil-cocoa butter 0.25-3-85.5 % suppository Commonly known as: PREPARATION H Place 1 suppository rectally as needed for hemorrhoids.   sucralfate 1 g tablet Commonly known as: CARAFATE Take 1 tablet (1 g total) by mouth 4 (four) times daily -  with meals and at bedtime.   Systane 0.4-0.3 % Gel ophthalmic gel Generic drug: Polyethyl Glycol-Propyl Glycol Place 1 application into both eyes 2 (two) times daily as needed (moisture).   Systane Nighttime Oint Place 1 application into both eyes at bedtime.   traMADol 50 MG tablet Commonly known as: ULTRAM Place 1 tablet (50 mg total) into feeding tube every 6 (six) hours as needed for severe pain.       Verbal and written Discharge instructions given to the patient. Wound care per Discharge AVS  Follow-up Information     Leonie Douglas, MD Follow up in 4 week(s).   Specialties: Vascular Surgery, Interventional Cardiology Why: Office will call you to arrange your appt (sent) Contact information: [address] [place] [address] [phone]                 Signed: Mosetta Pigeon 10/25/2020, 7:49 AM

## 2020-11-01 ENCOUNTER — Other Ambulatory Visit: Payer: Self-pay | Admitting: Nurse Practitioner

## 2020-11-02 ENCOUNTER — Other Ambulatory Visit: Payer: Self-pay

## 2020-11-02 DIAGNOSIS — I70245 Atherosclerosis of native arteries of left leg with ulceration of other part of foot: Secondary | ICD-10-CM

## 2020-11-04 ENCOUNTER — Other Ambulatory Visit: Payer: Self-pay

## 2020-11-04 ENCOUNTER — Other Ambulatory Visit: Payer: Medicare Other

## 2020-11-04 DIAGNOSIS — E1142 Type 2 diabetes mellitus with diabetic polyneuropathy: Secondary | ICD-10-CM

## 2020-11-04 DIAGNOSIS — E039 Hypothyroidism, unspecified: Secondary | ICD-10-CM | POA: Diagnosis not present

## 2020-11-04 DIAGNOSIS — R7309 Other abnormal glucose: Secondary | ICD-10-CM | POA: Diagnosis not present

## 2020-11-04 DIAGNOSIS — E440 Moderate protein-calorie malnutrition: Secondary | ICD-10-CM

## 2020-11-04 DIAGNOSIS — E1122 Type 2 diabetes mellitus with diabetic chronic kidney disease: Secondary | ICD-10-CM | POA: Diagnosis not present

## 2020-11-04 DIAGNOSIS — I10 Essential (primary) hypertension: Secondary | ICD-10-CM

## 2020-11-04 DIAGNOSIS — I4819 Other persistent atrial fibrillation: Secondary | ICD-10-CM

## 2020-11-04 DIAGNOSIS — E782 Mixed hyperlipidemia: Secondary | ICD-10-CM | POA: Diagnosis not present

## 2020-11-04 DIAGNOSIS — I5032 Chronic diastolic (congestive) heart failure: Secondary | ICD-10-CM

## 2020-11-05 ENCOUNTER — Other Ambulatory Visit: Payer: Self-pay | Admitting: Nurse Practitioner

## 2020-11-05 LAB — COMPLETE METABOLIC PANEL WITH GFR
AG Ratio: 1 (calc) (ref 1.0–2.5)
ALT: 11 U/L (ref 6–29)
AST: 19 U/L (ref 10–35)
Albumin: 3.7 g/dL (ref 3.6–5.1)
Alkaline phosphatase (APISO): 85 U/L (ref 37–153)
BUN/Creatinine Ratio: 38 (calc) — ABNORMAL HIGH (ref 6–22)
BUN: 61 mg/dL — ABNORMAL HIGH (ref 7–25)
CO2: 28 mmol/L (ref 20–32)
Calcium: 9.5 mg/dL (ref 8.6–10.4)
Chloride: 101 mmol/L (ref 98–110)
Creat: 1.61 mg/dL — ABNORMAL HIGH (ref 0.60–0.95)
Globulin: 3.8 g/dL (calc) — ABNORMAL HIGH (ref 1.9–3.7)
Glucose, Bld: 74 mg/dL (ref 65–99)
Potassium: 4.9 mmol/L (ref 3.5–5.3)
Sodium: 135 mmol/L (ref 135–146)
Total Bilirubin: 0.6 mg/dL (ref 0.2–1.2)
Total Protein: 7.5 g/dL (ref 6.1–8.1)
eGFR: 29 mL/min/{1.73_m2} — ABNORMAL LOW (ref >=60)

## 2020-11-05 LAB — CBC WITH DIFFERENTIAL/PLATELET
Absolute Monocytes: 675 cells/uL (ref 200–950)
Basophils Absolute: 59 cells/uL (ref 0–200)
Basophils Relative: 1.1 %
Eosinophils Absolute: 340 cells/uL (ref 15–500)
Eosinophils Relative: 6.3 %
HCT: 28.1 % — ABNORMAL LOW (ref 35.0–45.0)
Hemoglobin: 9 g/dL — ABNORMAL LOW (ref 11.7–15.5)
Lymphs Abs: 756 cells/uL — ABNORMAL LOW (ref 850–3900)
MCH: 29.6 pg (ref 27.0–33.0)
MCHC: 32 g/dL (ref 32.0–36.0)
MCV: 92.4 fL (ref 80.0–100.0)
MPV: 11.2 fL (ref 7.5–12.5)
Monocytes Relative: 12.5 %
Neutro Abs: 3569 cells/uL (ref 1500–7800)
Neutrophils Relative %: 66.1 %
Platelets: 313 10*3/uL (ref 140–400)
RBC: 3.04 10*6/uL — ABNORMAL LOW (ref 3.80–5.10)
RDW: 20 % — ABNORMAL HIGH (ref 11.0–15.0)
Total Lymphocyte: 14 %
WBC: 5.4 10*3/uL (ref 3.8–10.8)

## 2020-11-05 LAB — LIPID PANEL
Cholesterol: 111 mg/dL (ref <200)
HDL: 55 mg/dL (ref >=50)
LDL Cholesterol (Calc): 39 mg/dL (calc)
Non-HDL Cholesterol (Calc): 56 mg/dL (calc) (ref <130)
Total CHOL/HDL Ratio: 2 (calc) (ref <5.0)
Triglycerides: 91 mg/dL (ref <150)

## 2020-11-05 LAB — TSH: TSH: 0.41 mIU/L (ref 0.40–4.50)

## 2020-11-05 LAB — HEMOGLOBIN A1C
Hgb A1c MFr Bld: 5.5 % of total Hgb (ref <5.7)
Mean Plasma Glucose: 111 mg/dL
eAG (mmol/L): 6.2 mmol/L

## 2020-11-08 ENCOUNTER — Encounter: Payer: Medicare Other | Admitting: Podiatry

## 2020-11-08 ENCOUNTER — Other Ambulatory Visit: Payer: Self-pay

## 2020-11-08 ENCOUNTER — Ambulatory Visit (INDEPENDENT_AMBULATORY_CARE_PROVIDER_SITE_OTHER): Payer: Medicare Other | Admitting: Family

## 2020-11-08 ENCOUNTER — Ambulatory Visit: Payer: Medicare Other | Admitting: Nurse Practitioner

## 2020-11-08 ENCOUNTER — Encounter: Payer: Self-pay | Admitting: Family

## 2020-11-08 VITALS — BP 140/62 | HR 76 | Temp 97.0°F | Resp 18 | Ht 66.0 in | Wt 129.2 lb

## 2020-11-08 DIAGNOSIS — I4819 Other persistent atrial fibrillation: Secondary | ICD-10-CM

## 2020-11-08 DIAGNOSIS — E1142 Type 2 diabetes mellitus with diabetic polyneuropathy: Secondary | ICD-10-CM | POA: Diagnosis not present

## 2020-11-08 DIAGNOSIS — I5032 Chronic diastolic (congestive) heart failure: Secondary | ICD-10-CM | POA: Diagnosis not present

## 2020-11-08 DIAGNOSIS — J449 Chronic obstructive pulmonary disease, unspecified: Secondary | ICD-10-CM | POA: Diagnosis not present

## 2020-11-08 DIAGNOSIS — F325 Major depressive disorder, single episode, in full remission: Secondary | ICD-10-CM

## 2020-11-08 DIAGNOSIS — I70235 Atherosclerosis of native arteries of right leg with ulceration of other part of foot: Secondary | ICD-10-CM

## 2020-11-08 DIAGNOSIS — I10 Essential (primary) hypertension: Secondary | ICD-10-CM

## 2020-11-08 DIAGNOSIS — E782 Mixed hyperlipidemia: Secondary | ICD-10-CM

## 2020-11-08 DIAGNOSIS — E039 Hypothyroidism, unspecified: Secondary | ICD-10-CM

## 2020-11-08 NOTE — Progress Notes (Signed)
Provider: Yazlyn Wentzel FNP-C   [name], NP  Patient Care Team: [name], NP as PCP - General (Geriatric Medicine) Dorothy Spark, MD (Inactive) as PCP - Cardiology (Cardiology) Edrick Kins, DPM as Consulting Physician (Podiatry) Dorothy Spark, MD (Inactive) as Consulting Physician (Cardiology) [name], NP as Nurse Practitioner (Geriatric Medicine) Camillo Flaming, [place] as Referring Physician (Optometry) [name], RN as [place] Management  Extended Emergency Contact Information Primary Emergency Contact: Armstrong,[name] Address: [address]          [place], [place] [address] Johnnette Litter of [place] Phone: [phone] Mobile Phone: [phone] Relation: Daughter Secondary Emergency Contact: Coachman,John  [place] Phone: [phone] Relation: Son  Code Status:  Full Code  Goals of care: Advanced Directive information Advanced Directives 11/08/2020  Does Patient Have a Medical Advance Directive? Yes  Type of Paramedic of [place];Living will  Does patient want to make changes to medical advance directive? No - Patient declined  Copy of [place] in Chart? Yes - validated most recent copy scanned in chart (See row information)  Would patient like information on creating a medical advance directive? -     Chief Complaint  Patient presents with   Medical Management of Chronic Issues    4 month follow up.Patient would like to discuss Plavix.Review lab results.   Health Maintenance    Discuss the need for Foot exam.   Immunizations    Discuss the need for influenza vaccine, shingrix vaccine, and 2nd covid booster.     HPI:  Pt is a 87 y.o. female seen today for medical management of chronic diseases.she is here with her daughter.she denies any acute issues today. Her recent lab work unremarkable reviewed and discussed with patient  today.  Hgb A1C 5.5 has improved compared to previous 6.2 currently off antidiabetic medication.follows up with opthalmology. Daughter states has upcoming appointment with Podiatrist  next Monday 11/15/2020  BUN 61, CR 1.61 previous 1.45; 1.47; 1.80 did not have any water after midnight. She continue to receive Osmolite 1.5 via PEG tube.Daughter states gives 180 cc of water three times daily recommended by Nutritionist.also limited due to congestive heart failure.   Due for shingrix vaccine states had Zoster vaccine long time.discussed shingrix vaccine to be given at the Pharmacy.  Also due for 2nd COVID-19 booster vaccine  though states had the J & J vaccine.  No recent fall episode or hospitalization.    Past Medical History:  Diagnosis Date   Acute respiratory failure ([facility])    Anemia    previous blood transfusions   Arthritis    "all over"   Asthma    Atrial flutter (H[facility])    Bradycardia    requiring previous d/c of BB and reduction of amiodarone   CAD (coronary artery disease)    nonobstructive per notes   Chronic diastolic CHF (congestive heart failure) ([place])    CKD (chronic kidney disease), stage III ([facility])    Complication of blood transfusion    "got the wrong blood type at [facility] in ~ 2015; no adverse reaction that we are aware of"/daughter, [name] (01/27/2016)   COPD (chronic obstructive pulmonary disease) ([place] [facility])    Depression    "light case"   DVT (deep venous thrombosis) ([facility]) 01/2016   a. LLE DVT 01/2016 - switched from Eliquis to Coumadin.   Dyspnea    with some activity   Gastric  stenosis    a. s/p stomach tube   GERD (gastroesophageal reflux disease)    Headache    History of 2019 novel coronavirus disease (COVID-19)    History of blood transfusion    "several" (01/27/2016)   History of stomach ulcers    Hyperlipidemia    Hypertension    Hypothyroidism    On home oxygen therapy    "2L; 9PM til 9AM" (06/27/2017)   PAD (peripheral artery disease) ([facility])     a. prior gangrene, toe amputations, intervention.   PAF (paroxysmal atrial fibrillation) ([place])    Paraesophageal hernia    Perforated gastric ulcer ([place])    Pneumonia    "a few times" (06/27/2017)   Seasonal allergies    SIADH (syndrome of inappropriate ADH production) ([place])    [name] 01/10/2015   Small bowel obstruction ([place])    "I don't know how many" (01/11/2015)   Stroke ([place])    several- no residual   Type II diabetes mellitus ([place])    "related to prednisone use  for > 20 yr; once predinose stopped; no more DM RX" (01/27/2016)   UTI (urinary tract infection) 02/08/2016   Ventral hernia with bowel obstruction    Past Surgical History:  Procedure Laterality Date   ABDOMINAL AORTOGRAM N/A 09/21/2016   Procedure: Abdominal Aortogram;  Surgeon: Waynetta Sandy, MD;  Location: [facility];  Service: Cardiovascular;  Laterality: N/A;   ABDOMINAL AORTOGRAM W/LOWER EXTREMITY N/A 10/20/2020   Procedure: ABDOMINAL AORTOGRAM W/LOWER EXTREMITY;  Surgeon: Cherre Robins, MD;  Location: [facility];  Service: Cardiovascular;  Laterality: N/A;   AMPUTATION Left 09/25/2016   Procedure: AMPUTATION DIGIT- LEFT 2ND AND 3RD TOES;  Surgeon: Rosetta Posner, MD;  Location: [facility];  Service: Vascular;  Laterality: Left;   AMPUTATION Right 05/03/2018   Procedure: AMPUTATION RIGHT GREAT TOE;  Surgeon: Rosetta Posner, MD;  Location[facility];  Service: Vascular;  Laterality: Right;   CATARACT EXTRACTION W/ INTRAOCULAR LENS  IMPLANT, BILATERAL     CHOLECYSTECTOMY OPEN     COLECTOMY     ESOPHAGOGASTRODUODENOSCOPY N/A 01/19/2014   Procedure: ESOPHAGOGASTRODUODENOSCOPY (EGD);  Surgeon: Irene Shipper, MD;  Location: [facility];  Service: Endoscopy;  Laterality: N/A;   ESOPHAGOGASTRODUODENOSCOPY N/A 01/20/2014   Procedure: ESOPHAGOGASTRODUODENOSCOPY (EGD);  Surgeon: Irene Shipper, MD;  Location: [facility];  Service: Endoscopy;  Laterality: N/A;   ESOPHAGOGASTRODUODENOSCOPY N/A 03/19/2014    Procedure: ESOPHAGOGASTRODUODENOSCOPY (EGD);  Surgeon: Milus Banister, MD;  Location: [facility];  Service: Endoscopy;  Laterality: N/A;   ESOPHAGOGASTRODUODENOSCOPY N/A 07/08/2015   Procedure: ESOPHAGOGASTRODUODENOSCOPY (EGD);  Surgeon: Doran Stabler, MD;  Location: [facility];  Service: Endoscopy;  Laterality: N/A;   ESOPHAGOGASTRODUODENOSCOPY (EGD) WITH PROPOFOL N/A 09/15/2015   Procedure: ESOPHAGOGASTRODUODENOSCOPY (EGD) WITH PROPOFOL;  Surgeon: Manus Gunning, MD;  [facility];  Service: Gastroenterology;  Laterality: N/A;   ESOPHAGOGASTRODUODENOSCOPY (EGD) WITH PROPOFOL N/A 10/12/2020   Procedure: ESOPHAGOGASTRODUODENOSCOPY (EGD) WITH PROPOFOL;  Surgeon: Gatha Mayer, MD;  Location: [facility];  Service: Endoscopy;  Laterality: N/A;   GASTROJEJUNOSTOMY     hx/notes 01/10/2015   GASTROJEJUNOSTOMY N/A 09/23/2015   Procedure: OPEN GASTROJEJUNOSTOMY TUBE PLACEMENT;  Surgeon: Arta Bruce Kinsinger, MD;  Location: [facility];  Service: General;  Laterality: N/A;   GLAUCOMA SURGERY Bilateral    HEMATOMA EVACUATION Right 10/20/2020   Procedure: EVACUATION HEMATOMA RIGHT GROIN;  Surgeon: Cherre Robins, MD;  Location: [facility];  Service: Vascular;  Laterality: Right;  IR CM INJ ANY COLONIC TUBE W/FLUORO  01/05/2017   IR CM INJ ANY COLONIC TUBE W/FLUORO  06/07/2017   IR CM INJ ANY COLONIC TUBE W/FLUORO  11/05/2017   IR CM INJ ANY COLONIC TUBE W/FLUORO  09/18/2018   IR CM INJ ANY COLONIC TUBE W/FLUORO  03/06/2019   IR CM INJ ANY COLONIC TUBE W/FLUORO  03/11/2019   IR GASTR TUBE CONVERT GASTR-JEJ PER W/FL MOD SED  04/30/2020   IR GENERIC HISTORICAL  01/07/2016   IR GJ TUBE CHANGE 01/07/2016 Jacqulynn Cadet, MD [facility]   IR GENERIC HISTORICAL  01/27/2016   IR MECH REMOV OBSTRUC MAT ANY COLON TUBE W/FLUORO 01/27/2016 Markus Daft, MD [facility]   IR GENERIC HISTORICAL  02/07/2016   IR PATIENT EVAL TECH 0-60 MINS Darrell K Allred, PA-C [facility]   IR GENERIC  HISTORICAL  02/08/2016   IR GJ TUBE CHANGE 02/08/2016 Greggory Keen, MD [facility]   IR GENERIC HISTORICAL  01/06/2016   IR GJ TUBE CHANGE 01/06/2016 [facility] OUT REF   IR GENERIC HISTORICAL  05/02/2016   IR CM INJ ANY COLONIC TUBE W/FLUORO 05/02/2016 Arne Cleveland, MD [facility]   IR GENERIC HISTORICAL  05/15/2016   IR GJ TUBE CHANGE 05/15/2016 Sandi Mariscal, MD [facility]   IR GENERIC HISTORICAL  06/28/2016   IR GJ TUBE CHANGE 06/28/2016 [facility]   IR GJ TUBE CHANGE  02/20/2017   IR GJ TUBE CHANGE  05/10/2017   IR GJ TUBE CHANGE  07/06/2017   IR GJ TUBE CHANGE  08/02/2017   IR GJ TUBE CHANGE  10/15/2017   IR GJ TUBE CHANGE  12/26/2017   IR GJ TUBE CHANGE  04/08/2018   IR GJ TUBE CHANGE  06/19/2018   IR GJ TUBE CHANGE  03/03/2019   IR GJ TUBE CHANGE  06/06/2019   IR GJ TUBE CHANGE  09/26/2019   IR GJ TUBE CHANGE  01/09/2020   IR GJ TUBE CHANGE  05/12/2020   IR GJ TUBE CHANGE  10/05/2020   IR GJ TUBE CHANGE  10/14/2020   IR GJ TUBE CHANGE  10/19/2020   IR PATIENT EVAL TECH 0-60 MINS  10/19/2016   IR PATIENT EVAL TECH 0-60 MINS  12/25/2016   IR PATIENT EVAL TECH 0-60 MINS  01/29/2017   IR PATIENT EVAL TECH 0-60 MINS  04/04/2017   IR PATIENT EVAL TECH 0-60 MINS  04/30/2017   IR PATIENT EVAL TECH 0-60 MINS  07/31/2017   IR REPLC DUODEN/JEJUNO TUBE PERCUT W/FLUORO  11/14/2016   LAPAROTOMY N/A 01/20/2015   Procedure: EXPLORATORY LAPAROTOMY;  Surgeon: Coralie Keens, MD;  Location: [facility];  Service: General;  Laterality: N/A;   LOWER EXTREMITY ANGIOGRAPHY Left 09/21/2016   Procedure: Lower Extremity Angiography;  Surgeon: Waynetta Sandy, MD;  Location: [facility];  Service: Cardiovascular;  Laterality: Left;   LOWER EXTREMITY ANGIOGRAPHY Right 06/27/2017   Procedure: Lower Extremity Angiography;  Surgeon: Waynetta Sandy, MD;  Location: [facility];  Service: Cardiovascular;  Laterality: Right;   LYSIS OF ADHESION N/A 01/20/2015   Procedure:  LYSIS OF ADHESIONS < 1 HOUR;  Surgeon: Coralie Keens, MD;  Location: [facility];  Service: General;  Laterality: N/A;   PERIPHERAL VASCULAR BALLOON ANGIOPLASTY Left 09/21/2016   Procedure: Peripheral Vascular Balloon Angioplasty;  Surgeon: Waynetta Sandy, MD;  Location: [facility];  Service: Cardiovascular;  Laterality: Left;  drug coated balloon   PERIPHERAL VASCULAR BALLOON ANGIOPLASTY Right 06/27/2017   Procedure:  PERIPHERAL VASCULAR BALLOON ANGIOPLASTY;  Surgeon: Waynetta Sandy, MD;  Location: [facility];  Service: Cardiovascular;  Laterality: Right;  SFA/TPTRUNK   PERIPHERAL VASCULAR INTERVENTION Left 10/20/2020   Procedure: PERIPHERAL VASCULAR INTERVENTION;  Surgeon: Cherre Robins, MD;  Location: [facility];  Service: Cardiovascular;  Laterality: Left;   TONSILLECTOMY     TRANSMETATARSAL AMPUTATION Right 11/14/2019   Procedure: TRANSMETATARSAL AMPUTATION RIGHT FOOT;  Surgeon: Edrick Kins, DPM;  Location: [facility];  Service: Podiatry;  Laterality: Right;   TUBAL LIGATION     VENTRAL HERNIA REPAIR  2015   incarcerated ventral hernia ([facility] 09/2013)/notes 01/10/2015   WOUND EXPLORATION Right 10/20/2020   Procedure: PRIMARY CLOSURE COMMON FEMORAL ARTERY;  Surgeon: Cherre Robins, MD;  Location: [facility];  Service: Vascular;  Laterality: Right;    Allergies  Allergen Reactions   Penicillins Itching, Rash and Other (See Comments)    Tolerated amoxicillin, unasyn, zosyn & cephalosporins in the past. Did it involve swelling of the face/tongue/throat, SOB, or low BP? No Did it involve sudden or severe rash/hives, skin peeling, or any reaction on the inside of your mouth or nose? No Did you need to seek medical attention at a hospital or doctor's office? Unknown When did it last happen?      5+ years If all above answers are "NO", may proceed with cephalosporin use.     Allergies as of 11/08/2020       Reactions   Penicillins Itching, Rash, Other (See  Comments)   Tolerated amoxicillin, unasyn, zosyn & cephalosporins in the past. Did it involve swelling of the face/tongue/throat, SOB, or low BP? No Did it involve sudden or severe rash/hives, skin peeling, or any reaction on the inside of your mouth or nose? No Did you need to seek medical attention at a hospital or doctor's office? Unknown When did it last happen?      5+ years If all above answers are "NO", may proceed with cephalosporin use.        Medication List        Accurate as of November 08, 2020  1:24 PM. If you have any questions, ask your nurse or doctor.          STOP taking these medications    sucralfate 1 g tablet Commonly known as: CARAFATE Stopped by: Sandrea Hughs, NP       TAKE these medications    acetaminophen 500 MG tablet Commonly known as: TYLENOL Take 500 mg by mouth every 6 (six) hours as needed for moderate pain.   amLODipine 5 MG tablet Commonly known as: NORVASC TAKE 1 TABLET BY MOUTH EVERY DAY   Aspirin Low Dose 81 MG EC tablet Generic drug: aspirin Take 1 tablet (81 mg total) by mouth daily. Swallow whole.   atorvastatin 10 MG tablet Commonly known as: LIPITOR TAKE 1 TABLET BY MOUTH EVERY DAY   b complex vitamins tablet Take 1 tablet by mouth daily.   cholecalciferol 25 MCG (1000 UNIT) tablet Commonly known as: VITAMIN D3 Take 1,000 Units by mouth daily.   clopidogrel 75 MG tablet Commonly known as: Plavix Take 1 tablet (75 mg total) by mouth daily.   cycloSPORINE 0.05 % ophthalmic emulsion Commonly known as: Restasis USE 1 DROP INTO BOTH EYES TWICE DAILY   Dulera 200-5 MCG/ACT Aero Generic drug: mometasone-formoterol INHALE TWO PUFFS BY MOUTH TWICE DAILY   DULoxetine 30 MG capsule Commonly known as: CYMBALTA TAKE 1 CAPSULE BY MOUTH EVERY  DAY   EQL PROBIOTIC COLON SUPPORT PO Take 1 capsule by mouth daily.   feeding supplement (OSMOLITE 1.5 CAL) Liqd Place 711 mLs into feeding tube See admin instructions. Take  711 ml over a 12 hour period per day 9 am-9 pm at a rate of 65   ferrous sulfate 220 (44 Fe) MG/5ML solution TAKE 7.4 MLS (325 MG TOTAL) BY MOUTH DAILY.   fluticasone 50 MCG/ACT nasal spray Commonly known as: FLONASE SPRAY 2 SPRAYS INTO EACH NOSTRIL EVERY DAY   free water Soln Place 180 mLs into feeding tube 3 (three) times daily.   furosemide 40 MG tablet Commonly known as: LASIX TAKE 0.5-1 TABLETS (20-40 MG TOTAL) BY MOUTH SEE ADMIN INSTRUCTIONS. TAKE 20 MG DAILY, MAY INSTEAD TAKE 40 MG DAILY AS NEEDED FOR EDEMA   gabapentin 300 MG capsule Commonly known as: NEURONTIN Take 1 capsule (300 mg total) by mouth 2 (two) times daily.   gentamicin cream 0.1 % Commonly known as: GARAMYCIN Apply 1 application topically daily as needed (dry skin).   hydrALAZINE 50 MG tablet Commonly known as: APRESOLINE TAKE 1 TABLET BY MOUTH THREE TIMES A DAY   Insulin Pen Needle 31G X 5 MM Misc Commonly known as: B-D UF III MINI PEN NEEDLES Use twice daily with giving insulin injections. Dx E11.9   ipratropium-albuterol 0.5-2.5 (3) MG/3ML Soln Commonly known as: DUONEB Take 3 mLs by nebulization every 6 (six) hours as needed (shortness of breath).   isosorbide mononitrate 30 MG 24 hr tablet Commonly known as: IMDUR TAKE 2 TABLETS BY MOUTH DAILY.   latanoprost 0.005 % ophthalmic solution Commonly known as: XALATAN Place 1 drop into both eyes at bedtime.   levalbuterol 45 MCG/ACT inhaler Commonly known as: XOPENEX HFA Inhale 2 puffs into the lungs every 6 (six) hours as needed for wheezing.   levothyroxine 125 MCG tablet Commonly known as: SYNTHROID TAKE 1 TABLET BY MOUTH EVERY DAY   loratadine 10 MG tablet Commonly known as: CLARITIN Take 10 mg by mouth daily.   multivitamin with minerals Tabs tablet Take 1 tablet by mouth daily.   nystatin 100000 UNIT/ML suspension Commonly known as: MYCOSTATIN Take 5 mLs (500,000 Units total) by mouth 4 (four) times daily.   nystatin  powder Commonly known as: MYCOSTATIN/NYSTOP Apply 1 application topically as needed (skin irritation).   nystatin cream Commonly known as: MYCOSTATIN APPLY TO AFFECTED AREA TWICE A DAY   oxyCODONE 5 MG immediate release tablet Commonly known as: Roxicodone Take 0.5-1 tablets (2.5-5 mg total) by mouth every 8 (eight) hours as needed for severe pain.   pantoprazole 40 MG tablet Commonly known as: PROTONIX Take 1 tablet (40 mg total) by mouth 2 (two) times daily.   senna-docusate 8.6-50 MG tablet Commonly known as: Senokot-S Take 1 tablet by mouth as needed for mild constipation.   shark liver oil-cocoa butter 0.25-3-85.5 % suppository Commonly known as: PREPARATION H Place 1 suppository rectally as needed for hemorrhoids.   Systane 0.4-0.3 % Gel ophthalmic gel Generic drug: Polyethyl Glycol-Propyl Glycol Place 1 application into both eyes 2 (two) times daily as needed (moisture).   Systane Nighttime Oint Place 1 application into both eyes at bedtime.   traMADol 50 MG tablet Commonly known as: ULTRAM Place 1 tablet (50 mg total) into feeding tube every 6 (six) hours as needed for severe pain.        Review of Systems  Constitutional:  Negative for appetite change, chills, fatigue, fever and unexpected weight change.  HENT:  Positive for hearing loss. Negative for congestion, dental problem, ear discharge, ear pain, facial swelling, nosebleeds, postnasal drip, rhinorrhea, sinus pressure, sinus pain, sneezing, sore throat, tinnitus and trouble swallowing.   Eyes:  Negative for pain, discharge, redness, itching and visual disturbance.  Respiratory:  Negative for cough, chest tightness, shortness of breath and wheezing.   Cardiovascular:  Negative for chest pain, palpitations and leg swelling.  Gastrointestinal:  Negative for abdominal distention, abdominal pain, blood in stool, constipation, diarrhea, nausea and vomiting.       PEG tube   Endocrine: Negative for cold  intolerance, heat intolerance, polydipsia, polyphagia and polyuria.  Genitourinary:  Negative for difficulty urinating, dysuria, flank pain, frequency and urgency.  Musculoskeletal:  Positive for gait problem. Negative for arthralgias, back pain, joint swelling, myalgias, neck pain and neck stiffness.  Skin:  Negative for color change, pallor, rash and wound.  Neurological:  Negative for dizziness, syncope, speech difficulty, weakness, light-headedness, numbness and headaches.  Hematological:  Does not bruise/bleed easily.  Psychiatric/Behavioral:  Negative for agitation, behavioral problems, confusion, hallucinations, self-injury, sleep disturbance and suicidal ideas. The patient is not nervous/anxious.    Immunization History  Administered Date(s) Administered   Fluad Quad(high Dose 65+) 12/02/2018, 12/30/2019   Influenza, High Dose Seasonal PF 01/19/2017   Influenza,inj,Quad PF,6+ Mos 12/24/2013, 01/27/2015, 12/29/2015, 12/04/2017   Influenza-Unspecified 12/02/2012   Janssen (J&J) SARS-COV-2 Vaccination 07/11/2019   Pneumococcal Conjugate-13 02/03/2014   Pneumococcal Polysaccharide-23 08/31/2016   Tdap 07/18/2016   Zoster, Live 04/12/2011   Pertinent  Health Maintenance Due  Topic Date Due   FOOT EXAM  10/19/2020   INFLUENZA VACCINE  11/01/2020   OPHTHALMOLOGY EXAM  02/08/2021   HEMOGLOBIN A1C  05/07/2021   DEXA SCAN  Completed   PNA vac Low Risk Adult  Completed   Fall Risk  11/08/2020 10/15/2020 07/05/2020 11/07/2019 10/21/2019  Falls in the past year? 0 0 0 0 0  Number falls in past yr: 0 0 0 0 0  Injury with Fall? 0 0 0 0 0  Risk for fall due to : No Fall Risks No Fall Risks - - -  Follow up Falls evaluation completed Falls evaluation completed - - -   Functional Status Survey:    Vitals:   11/08/20 1312  BP: 140/62  Pulse: 76  Resp: 18  Temp: (!) 97 F (36.1 C)  SpO2: 100%  Weight: 129 lb 3.2 oz (58.6 kg)  Height: '5\' 6"'  (1.676 m)   Body mass index is 20.85  kg/m. Physical Exam Vitals reviewed.  Constitutional:      General: She is not in acute distress.    Appearance: Normal appearance. She is normal weight. She is not ill-appearing or diaphoretic.  HENT:     Head: Normocephalic.     Right Ear: Tympanic membrane, ear canal and external ear normal. There is no impacted cerumen.     Left Ear: Tympanic membrane, ear canal and external ear normal. There is no impacted cerumen.     Ears:     Comments: Hearing aids bilateral     Nose: Nose normal. No congestion or rhinorrhea.     Mouth/Throat:     Mouth: Mucous membranes are moist.     Pharynx: Oropharynx is clear. No oropharyngeal exudate or posterior oropharyngeal erythema.  Eyes:     General: No scleral icterus.       Right eye: No discharge.        Left eye: No discharge.     Extraocular  Movements: Extraocular movements intact.     Conjunctiva/sclera: Conjunctivae normal.     Pupils: Pupils are equal, round, and reactive to light.  Neck:     Vascular: No carotid bruit.  Cardiovascular:     Rate and Rhythm: Normal rate and regular rhythm.     Pulses: Normal pulses.     Heart sounds: Normal heart sounds. No murmur heard.   No friction rub. No gallop.  Pulmonary:     Effort: Pulmonary effort is normal. No respiratory distress.     Breath sounds: Normal breath sounds. No wheezing, rhonchi or rales.  Chest:     Chest wall: No tenderness.  Abdominal:     General: Bowel sounds are normal. There is no distension.     Palpations: Abdomen is soft. There is no mass.     Tenderness: There is no abdominal tenderness. There is no right CVA tenderness, left CVA tenderness, guarding or rebound.     Comments: PEG tube site dry clean and intact  Musculoskeletal:        General: No swelling or tenderness. Normal range of motion.     Cervical back: Normal range of motion. No rigidity or tenderness.     Right lower leg: No edema.     Left lower leg: No edema.     Comments: Unsteady gait walks  with Rolator   Lymphadenopathy:     Cervical: No cervical adenopathy.  Skin:    General: Skin is warm and dry.     Coloration: Skin is not pale.     Findings: No bruising, erythema, lesion or rash.     Comments: Left foot dressing dry clean and intact Darco shoe in place   Neurological:     Mental Status: She is alert and oriented to person, place, and time.     Cranial Nerves: No cranial nerve deficit.     Sensory: No sensory deficit.     Motor: No weakness.     Coordination: Coordination normal.     Gait: Gait normal.  Psychiatric:        Mood and Affect: Mood normal.        Speech: Speech normal.        Behavior: Behavior normal.        Thought Content: Thought content normal.        Judgment: Judgment normal.    Labs reviewed: Recent Labs    11/15/19 0506 04/22/20 1232 10/20/20 1637 10/21/20 0424 11/04/20 1114  NA 134*   < > 136 134* 135  K 4.3   < > 4.2 4.2 4.9  CL 100   < > 104 102 101  CO2 24   < > '28 26 28  ' GLUCOSE 162*   < > 128* 146* 74  BUN 37*   < > 44* 39* 61*  CREATININE 1.37*   < > 1.47* 1.45* 1.61*  CALCIUM 9.3   < > 8.7* 8.5* 9.5  MG 2.1  --   --   --   --   PHOS 4.4  --   --   --   --    < > = values in this interval not displayed.   Recent Labs    10/11/20 2337 10/13/20 0324 10/14/20 0322 10/15/20 1436 11/04/20 1114  AST '23 22 23 28 19  ' ALT '18 15 15 16 11  ' ALKPHOS 66 59 66  --   --   BILITOT 1.0 1.2 0.6 0.5 0.6  PROT 6.9 6.1*  6.0* 6.8 7.5  ALBUMIN 2.9* 2.5* 2.4*  --   --    Recent Labs    10/13/20 0324 10/15/20 1436 10/20/20 0747 10/21/20 0424 10/22/20 0901 11/04/20 1114  WBC 4.8 6.9   < > 5.8 6.4 5.4  NEUTROABS 3.1 5,023  --   --   --  3,569  HGB 9.5* 9.8*   < > 6.8* 8.8* 9.0*  HCT 28.5* 30.0*   < > 20.2* 26.3* 28.1*  MCV 84.6 87.7   < > 87.8 87.4 92.4  PLT 204 250   < > 183 182 313   < > = values in this interval not displayed.   Lab Results  Component Value Date   TSH 0.41 11/04/2020   Lab Results  Component Value  Date   HGBA1C 5.5 11/04/2020   Lab Results  Component Value Date   CHOL 111 11/04/2020   HDL 55 11/04/2020   LDLCALC 39 11/04/2020   TRIG 91 11/04/2020   CHOLHDL 2.0 11/04/2020    Significant Diagnostic Results in last 30 days:  IR GJ Tube Change  Result Date: 10/19/2020 INDICATION: 87 year old female with chronic indwelling gastrojejunostomy tube presenting with leaking. EXAM: FLUOROSCOPIC GUIDED REPLACEMENT OF GASTROJEJUNOSTOMY TUBE COMPARISON:  10/14/2020 MEDICATIONS: None. CONTRAST:  68m OMNIPAQUE IOHEXOL 300 MG/ML  SOLN FLUOROSCOPY TIME:  0 minutes 54 seconds, 5 COMPLICATIONS: None. PROCEDURE: Informed written consent was obtained from the patient after a discussion of the risks and benefits. The upper abdomen and the external portion of the existing gastrojejunostomy tube was prepped and draped in the usual sterile fashion, and a sterile drape was applied covering the operative field. Maximum barrier sterile technique with sterile gowns and gloves were used for the procedure. A timeout was performed prior to the initiation of the procedure. Hand injection of contrast via the jejunal and gastric lumens demonstrate patency of the indwelling tube. A Glidewire was then inserted through the jejunal lumen. There was difficulty advancing passed a kink in the tube within the gastric fundus. The wire was then able to be passed after insertion of a Kumpe catheter over the wire. The Glidewire was positioned in proximal jejunum the balloon deflated, and the indwelling gastrojejunostomy tube was removed. A new, 22 French gastrojejunostomy tube was then inserted. There was redundancy within the stomach due to angulation of regional insertion, with the tip of the jejunal arm in the proximal duodenum. Contrast injection via the jejunostomy and gastric lumens confirmed appropriate functioning and positioning. A dressing was placed. The patient tolerated procedure well without immediate postprocedural  complication. IMPRESSION: Successful fluoroscopic guided exchange of 22 French gastrojejunostomy tube. DRuthann Cancer MD [facility] Electronically Signed   By: DRuthann CancerMD   On: 10/19/2020 17:02   IR GBeverely LowTube Change  Result Date: 10/14/2020 INDICATION: Chronic feeding gastrojejunostomy tube with current tube malpositioned with jejunal limb coiled within the stomach. EXAM: FLUOROSCOPIC GUIDED REPLACEMENT OF GASTROJEJUNOSTOMY TUBE COMPARISON:  Multiple previous fluoroscopic guided gastrojejunostomy catheter exchanges, most recently on 10/05/2020 MEDICATIONS: None. CONTRAST:  15 cc Omnipaque 300, administered into the gastric lumen and proximal small bowel. FLUOROSCOPY TIME:  2 minutes, 12 seconds (6 mGy) COMPLICATIONS: None. PROCEDURE: Informed written consent was obtained from the patient after a discussion of the risks and benefits. The upper abdomen and the external portion of the existing gastrojejunostomy tube was prepped and draped in the usual sterile fashion, and a sterile drape was applied covering the operative field. Maximum barrier sterile technique with  sterile gowns and gloves were used for the procedure. A timeout was performed prior to the initiation of the procedure. Preprocedural spot fluoroscopic image was obtained of the upper abdomen demonstrates malpositioning of the jejunal limb, coiled within the gastric lumen. The external portion of the GJ tube was cut, the retention balloon was deflated and the GJ tube was removed intact. Next, with the use of a stiff glidewire, a Kumpe catheter was advanced through the gastric antrum to the level of the proximal small bowel. Contrast injection confirmed appropriate positioning. Next, under intermittent fluoroscopic guidance, the Kumpe catheter was exchanged for a new 76 French balloon retention gastrojejunostomy tube with tip ultimately terminating within the proximal small bowel. Contrast  injection via the jejunostomy and gastric lumens confirmed appropriate functioning and positioning. A dressing was applied. The patient tolerated procedure well without immediate postprocedural complication. IMPRESSION: Successful fluoroscopic guided replacement of 37 French balloon retention gastrojejunostomy tube. The tip of the jejunostomy lumen lies within the proximal jejunum. Both lumens ready for immediate use. Electronically Signed   By: Sandi Mariscal M.D.   On: 10/14/2020 14:44   PERIPHERAL VASCULAR CATHETERIZATION  Result Date: 10/20/2020 DATE OF SERVICE: 10/20/2020  PATIENT:  Flonnie Overman  87 y.o. female  PRE-OPERATIVE DIAGNOSIS:  Atherosclerosis of native arteries of left lower extremity causing ulceration  POST-OPERATIVE DIAGNOSIS:  Same  PROCEDURE:  1) US guided right common femoral artery access 2) Aortogram 3) Left lower extremity angiogram with third order cannulation (74m total contrast) 4) left popliteal angioplasty (4x1024mMustang)  SURGEON:  Surgeon(s) and Role:    * HaCherre RobinsMD - Primary  ASSISTANT: none  ANESTHESIA:   local  EBL: min  BLOOD ADMINISTERED:none  DRAINS: none  LOCAL MEDICATIONS USED:  LIDOCAINE  SPECIMEN:  none  COUNTS: confirmed correct.  TOURNIQUET:  none  PATIENT DISPOSITION:  PACU - hemodynamically stable.  Delay start of Pharmacological VTE agent (>24hrs) due to surgical blood loss or risk of bleeding: no  INDICATION FOR PROCEDURE: LeLEANOR VORISs a [age].o. female with nonhealing left medial foot wound with noninvasive evidence of peripheral arterial disease. After careful discussion of risks, benefits, and alternatives the patient was offered angiography. We specifically discussed access site complication. The patient understood and wished to proceed.  OPERATIVE FINDINGS: Aorta No flow-limiting stenosis Iliac arteries No flow-limiting stenosis in common or external iliac arteries.  Evaluation limited by carbon oxide contrast. Left lower extremity No  flow-limiting stenosis in common femoral, profunda femoris arteries Diffuse disease and superficial femoral artery, no hemodynamically significant stenosis 2 areas of severe stenosis in the popliteal artery (greater than 80%) Below-knee popliteal artery without flow-limiting stenosis Tibial trifurcation is patent. Left anterior tibial artery occludes proximal calf Tibioperoneal trunk with mild disease (50% stenosis) which does not appear to limit flow Posterior tibial artery and peroneal arteries dominant and flow to the foot without significant stenosis Disadvantaged pedal circulation   DESCRIPTION OF PROCEDURE: After identification of the patient in the pre-operative holding area, the patient was transferred to the operating room. The patient was positioned supine on the operating room table. Anesthesia was induced. The groins was prepped and draped in standard fashion. A surgical pause was performed confirming correct patient, procedure, and operative location.  The right groin was anesthetized with subcutaneous injection of 1% lidocaine. Using ultrasound guidance, the right common femoral artery was accessed with micropuncture technique. Fluoroscopy was used to confirm cannulation over the femoral head. Sheathogram was not performed. The 36F sheath was  upsized to 34F.  An 035 glidewire advantage was advanced into the distal aorta. Over the wire an omni flush catheter was advanced to the level of L2. Aortogram was performed - see above for details.  The left common iliac artery was selected with the 035 glidewire advantage. The wire was advanced into the common femoral artery. Over the wire the omni flush catheter was advanced into the external iliac artery. Selective angiography was performed - see above for details.  The decision was made to intervene. The patient was heparinized with 5000 units of heparin. The sheath was exchanged for a 58F x 45cm sheath. Selective angiography of the left lower extremity was  performed prior to intervention. The popliteal lesion was treated with a 4 x 100 mm Mustang balloon.  Completion angiography revealed resolution of popliteal stenosis  A mynx device was used to close the arteriotomy. Hemostasis was excellent upon completion.  Upon completion of the case instrument and sharps counts were confirmed correct. The patient was transferred to the PACU in good condition. I was present for all portions of the procedure.  PLAN: Good technical result from angiography.  Disadvantaged pedal circulation will make wound healing difficult.  Recommend aspirin 81 mg p.o. indefinitely.  Recommend Plavix 75 mg p.o. x3 months.  Recommend high intensity statin therapy indefinitely.  Follow-up with me in 1 month.  Okay to proceed with left transmetatarsal amputation from my perspective.  [name]. Stanford Breed, MD [facility] Phone Number: [phone] 10/20/2020 9:32 AM    Assessment/Plan 1. Essential hypertension, benign B/p stable  - continue on amlodipine,Hydralazine,furosemide and Imdur  - CBC with Differential/Platelet; Future - CMP with eGFR([facility]); Future  2. Diabetic polyneuropathy associated with type 2 diabetes mellitus ([place]) Continue on gabapentin  - diabetic ulcer left foot scabbed per POA  - continue current pain regimen.Narcotic use contract updated. Unable to obtain UDT due to incontinence - Hemoglobin A1c; Future  3. Type 2 diabetes mellitus with peripheral neuropathy [facility]) Lab Results  Component Value Date   HGBA1C 5.5 11/04/2020  Well controlled.off medication - Hemoglobin A1c; Future  4. Depression, major, single episode, complete remission (HCC) Mood stable. - continue on Cymbalta   5. COPD with asthma ([place]) Breathing stable. - continue on Xopenex,Dulera and Duoneb   6. Chronic diastolic CHF (congestive heart failure) (HCC) No signs of fluid overload  - continue on Furosemide   7. Mixed hyperlipidemia LDL at goal. -  continue on Lipitor  - Lipid panel; Future  8. Acquired hypothyroidism Lab Results  Component Value Date   TSH 0.41 11/04/2020  - continue levothyroxine 125 mcg tablet daily  - TSH; Future  9. Persistent atrial fibrillation (HCC) HR controlled off coumadin due to bleeding  On Plavix   Family/ staff Communication: Reviewed plan of care with patient and daughter verbalized understanding.   Labs/tests ordered:  - CBC with Differential/Platelet - CMP with eGFR([facility]) - TSH - Hgb A1C - Lipid panel  Next Appointment : 6 months for medical management of chronic issues.Fasting Labs prior to visit.    Sandrea Hughs, NP

## 2020-11-15 ENCOUNTER — Ambulatory Visit (INDEPENDENT_AMBULATORY_CARE_PROVIDER_SITE_OTHER): Payer: Medicare Other | Admitting: Podiatry

## 2020-11-15 ENCOUNTER — Other Ambulatory Visit: Payer: Self-pay

## 2020-11-15 DIAGNOSIS — I70235 Atherosclerosis of native arteries of right leg with ulceration of other part of foot: Secondary | ICD-10-CM

## 2020-11-15 DIAGNOSIS — L97522 Non-pressure chronic ulcer of other part of left foot with fat layer exposed: Secondary | ICD-10-CM | POA: Diagnosis not present

## 2020-11-15 DIAGNOSIS — L97512 Non-pressure chronic ulcer of other part of right foot with fat layer exposed: Secondary | ICD-10-CM | POA: Diagnosis not present

## 2020-11-15 NOTE — Progress Notes (Signed)
Subjective:  Patient presents today status post transmetatarsal amputation of the right foot. DOS: 11/14/2019.  Patient continues to have some slight drainage to the wounds and tenderness to the foot and the leg.  Overall she says there is some improvement.  She presents for further treatment and evaluation  Past Medical History:  Diagnosis Date   Acute respiratory failure ([place])    Anemia    previous blood transfusions   Arthritis    "all over"   Asthma    Atrial flutter (HCC)    Bradycardia    requiring previous d/c of BB and reduction of amiodarone   CAD (coronary artery disease)    nonobstructive per notes   Chronic diastolic CHF (congestive heart failure) ([place])    CKD (chronic kidney disease), stage III ([place])    Complication of blood transfusion    "got the wrong blood type at [facility] in ~ 2015; no adverse reaction that we are aware of"/daughter, [name] (01/27/2016)   COPD (chronic obstructive pulmonary disease) ([place])    Depression    "light case"   DVT (deep venous thrombosis) () 01/2016   a. LLE DVT 01/2016 - switched from Eliquis to Coumadin.   Dyspnea    with some activity   Gastric stenosis    a. s/p stomach tube   GERD (gastroesophageal reflux disease)    Headache    History of 2019 novel coronavirus disease (COVID-19)    History of blood transfusion    "several" (01/27/2016)   History of stomach ulcers    Hyperlipidemia    Hypertension    Hypothyroidism    On home oxygen therapy    "2L; 9PM til 9AM" (06/27/2017)   PAD (peripheral artery disease) (HCC)    a. prior gangrene, toe amputations, intervention.   PAF (paroxysmal atrial fibrillation) ([place])    Paraesophageal hernia    Perforated gastric ulcer ([place])    Pneumonia    "a few times" (06/27/2017)   Seasonal allergies    SIADH (syndrome of inappropriate ADH production) ([place])    [name] 01/10/2015   Small bowel obstruction ([place])    "I don't know how many" (01/11/2015)   Stroke ([place])    several- no  residual   Type II diabetes mellitus ([place])    "related to prednisone use  for > 20 yr; once predinose stopped; no more DM RX" (01/27/2016)   UTI (urinary tract infection) 02/08/2016   Ventral hernia with bowel obstruction          Objective: Physical Exam General: The patient is alert and oriented x3 in no acute distress.  Dermatology: Ulcer noted to the medial aspect of the first MTPJ left which unfortunately has deteriorated over the last 2 weeks.  Drainage noted.  There is exposed joint capsule and possible bone to the area  Vascular: Patient recently underwent balloon angioplasty with vein and vascular  VAS [place] ABI W/WO TBI 09/10/2020 Right: Resting right ankle-brachial index is within normal range using the  posterior tibial artery . No evidence of significant right lower extremity  arterial disease.   Left: Resting left ankle-brachial index indicates noncompressible left  lower extremity arteries. Unable to obtain left great toe index due to  bandage.   Neurological: Epicritic and protective threshold diminished bilaterally.   Musculoskeletal Exam: Status post TMA right foot.  Hallux valgus left with overlying ulcer and degenerative changes possible exposed bone and joint capsule.  Second and third toe amputation left.  Radiographic exam bilateral feet 09/22/2020:  Transmetatarsal amputation noted right foot.  Second and third toe amputations left.  No significant osseous erosions.  Left foot demonstrates history of left second and third toe amputation with resection of the metatarsal heads as well and hallux valgus deformity.  Diffuse degenerative changes noted.  No gas within the tissue.  Cortical erosive changes noted to the medial aspect of the first metatarsal head just underlying the area of the wound which is consistent with osteomyelitis of the distal portion of the first metatarsal.  Assessment: 1. s/p transmetatarsal amputation right foot. DOS: 11/14/2019 2.  Ulcer  right lateral forefoot amputation stump 3.  Hallux valgus left with ulcer down to level bone and joint. H/o 2nd toe amputation left 4.  Osteomyelitis first metatarsal left foot  Plan of Care:  1. Patient was evaluated.  2.  Continue Betadine with dry sterile dressings daily bilateral  3.  Continue diabetic shoes with metatarsal pads to offload pressure from the right forefoot amputation stump 4.  At the moment the patient would like to hold off on surgery.  I did explain the concern with underlying osteomyelitis of the left foot however the patient and daughter would like to hold off on surgery for now.  She has gone through a lot and been in and out of the hospital.  This is understandable.  Explained to the patient that if the wounds do not show steady improvement and regress or worsen we will need to immediately proceed with surgery 5.  In the meantime continue Betadine ointment and light dressing daily  6.  Return to clinic in 3 weeks for conservative wound care  Edrick Kins, DPM [facility]  Dr. Edrick Kins, DPM    [address], [place] [address]                Office [phone]  Fax [phone]

## 2020-11-16 ENCOUNTER — Ambulatory Visit (INDEPENDENT_AMBULATORY_CARE_PROVIDER_SITE_OTHER): Payer: Medicare Other | Admitting: Physician Assistant

## 2020-11-16 ENCOUNTER — Encounter: Payer: Self-pay | Admitting: Physician Assistant

## 2020-11-16 ENCOUNTER — Ambulatory Visit (HOSPITAL_COMMUNITY)
Admission: RE | Admit: 2020-11-16 | Discharge: 2020-11-16 | Disposition: A | Discharge status: Home / Self Care | Payer: Medicare Other | Source: Ambulatory Visit | Attending: Vascular Surgery | Admitting: Vascular Surgery

## 2020-11-16 ENCOUNTER — Ambulatory Visit (INDEPENDENT_AMBULATORY_CARE_PROVIDER_SITE_OTHER)
Admit: 2020-11-16 | Discharge: 2020-11-16 | Disposition: A | Discharge status: Home / Self Care | Payer: Medicare Other | Attending: Vascular Surgery | Admitting: Vascular Surgery

## 2020-11-16 VITALS — BP 158/68 | HR 80 | Temp 96.7°F | Ht 66.0 in | Wt 129.7 lb

## 2020-11-16 DIAGNOSIS — I70245 Atherosclerosis of native arteries of left leg with ulceration of other part of foot: Secondary | ICD-10-CM | POA: Diagnosis not present

## 2020-11-16 DIAGNOSIS — I70235 Atherosclerosis of native arteries of right leg with ulceration of other part of foot: Secondary | ICD-10-CM

## 2020-11-16 NOTE — Progress Notes (Signed)
History of Present Illness:  Patient is a 87 y.o. year old female who presents for evaluation of PAD.  She has atherosclerosis of native arteries of bilateral lower extremities causing ulceration. S/P left popliteal angioplasty followed by CFA repair due to hematoma on 10/20/20.  She is followed by Dr. Daylene Katayama DPM for right plantar ulcer after TMA and left first MTP medial ulcer.   They are discussing TMA on the left now that we have improved her blood flow.    She denise pain and new ulcers.  She wears post op shoes with dry dressings on B feet.  She denise fever and chills.    [facility] with Lipitor, Plavix and ASA.       Past Medical History:  Diagnosis Date   Acute respiratory failure ([place])    Anemia    previous blood transfusions   Arthritis    "all over"   Asthma    Atrial flutter (HCC)    Bradycardia    requiring previous d/c of BB and reduction of amiodarone   CAD (coronary artery disease)    nonobstructive per notes   Chronic diastolic CHF (congestive heart failure) ([place])    CKD (chronic kidney disease), stage III ([place])    Complication of blood transfusion    "got the wrong blood type at [facility] in ~ 2015; no adverse reaction that we are aware of"/daughter, [name] (01/27/2016)   COPD (chronic obstructive pulmonary disease) ([place])    Depression    "light case"   DVT (deep venous thrombosis) ([place]) 01/2016   a. LLE DVT 01/2016 - switched from Eliquis to Coumadin.   Dyspnea    with some activity   Gastric stenosis    a. s/p stomach tube   GERD (gastroesophageal reflux disease)    Headache    History of 2019 novel coronavirus disease (COVID-19)    History of blood transfusion    "several" (01/27/2016)   History of stomach ulcers    Hyperlipidemia    Hypertension    Hypothyroidism    On home oxygen therapy    "2L; 9PM til 9AM" (06/27/2017)   PAD (peripheral artery disease) (HCC)    a. prior gangrene, toe amputations, intervention.   PAF  (paroxysmal atrial fibrillation) ([place])    Paraesophageal hernia    Perforated gastric ulcer ([place])    Pneumonia    "a few times" (06/27/2017)   Seasonal allergies    SIADH (syndrome of inappropriate ADH production) ([place])    [name] 01/10/2015   Small bowel obstruction ([place])    "I don't know how many" (01/11/2015)   Stroke ([place])    several- no residual   Type II diabetes mellitus ([place])    "related to prednisone use  for > 20 yr; once predinose stopped; no more DM RX" (01/27/2016)   UTI (urinary tract infection) 02/08/2016   Ventral hernia with bowel obstruction     Past Surgical History:  Procedure Laterality Date   ABDOMINAL AORTOGRAM N/A 09/21/2016   Procedure: Abdominal Aortogram;  Surgeon: Waynetta Sandy, MD;  Location: [facility];  Service: Cardiovascular;  Laterality: N/A;   ABDOMINAL AORTOGRAM W/LOWER EXTREMITY N/A 10/20/2020   Procedure: ABDOMINAL AORTOGRAM W/LOWER EXTREMITY;  Surgeon: Cherre Robins, MD;  Location: [facility];  Service: Cardiovascular;  Laterality: N/A;   AMPUTATION Left 09/25/2016   Procedure: AMPUTATION DIGIT- LEFT 2ND AND 3RD TOES;  Surgeon: Rosetta Posner, MD;  Location: [facility];  Service:  Vascular;  Laterality: Left;   AMPUTATION Right 05/03/2018   Procedure: AMPUTATION RIGHT GREAT TOE;  Surgeon: Rosetta Posner, MD;  [facility];  Service: Vascular;  Laterality: Right;   CATARACT EXTRACTION W/ INTRAOCULAR LENS  IMPLANT, BILATERAL     CHOLECYSTECTOMY OPEN     COLECTOMY     ESOPHAGOGASTRODUODENOSCOPY N/A 01/19/2014   Procedure: ESOPHAGOGASTRODUODENOSCOPY (EGD);  Surgeon: Irene Shipper, MD;  Location: [facility];  Service: Endoscopy;  Laterality: N/A;   ESOPHAGOGASTRODUODENOSCOPY N/A 01/20/2014   Procedure: ESOPHAGOGASTRODUODENOSCOPY (EGD);  Surgeon: Irene Shipper, MD;  Location: [facility];  Service: Endoscopy;  Laterality: N/A;   ESOPHAGOGASTRODUODENOSCOPY N/A 03/19/2014   Procedure: ESOPHAGOGASTRODUODENOSCOPY (EGD);  Surgeon:  Milus Banister, MD;  Location: [facility];  Service: Endoscopy;  Laterality: N/A;   ESOPHAGOGASTRODUODENOSCOPY N/A 07/08/2015   Procedure: ESOPHAGOGASTRODUODENOSCOPY (EGD);  Surgeon: Doran Stabler, MD;  Location: [facility];  Service: Endoscopy;  Laterality: N/A;   ESOPHAGOGASTRODUODENOSCOPY (EGD) WITH PROPOFOL N/A 09/15/2015   Procedure: ESOPHAGOGASTRODUODENOSCOPY (EGD) WITH PROPOFOL;  Surgeon: Manus Gunning, MD;  [facility];  Service: Gastroenterology;  Laterality: N/A;   ESOPHAGOGASTRODUODENOSCOPY (EGD) WITH PROPOFOL N/A 10/12/2020   Procedure: ESOPHAGOGASTRODUODENOSCOPY (EGD) WITH PROPOFOL;  Surgeon: Gatha Mayer, MD;  Location: [facility];  Service: Endoscopy;  Laterality: N/A;   GASTROJEJUNOSTOMY     hx/notes 01/10/2015   GASTROJEJUNOSTOMY N/A 09/23/2015   Procedure: OPEN GASTROJEJUNOSTOMY TUBE PLACEMENT;  Surgeon: Arta Bruce Kinsinger, MD;  [facility]: [facility];  Service: General;  Laterality: N/A;   GLAUCOMA SURGERY Bilateral    HEMATOMA EVACUATION Right 10/20/2020   Procedure: EVACUATION HEMATOMA RIGHT GROIN;  Surgeon: Cherre Robins, MD;  Location: [facility];  Service: Vascular;  Laterality: Right;   IR CM INJ ANY COLONIC TUBE W/FLUORO  01/05/2017   IR CM INJ ANY COLONIC TUBE W/FLUORO  06/07/2017   IR CM INJ ANY COLONIC TUBE W/FLUORO  11/05/2017   IR CM INJ ANY COLONIC TUBE W/FLUORO  09/18/2018   IR CM INJ ANY COLONIC TUBE W/FLUORO  03/06/2019   IR CM INJ ANY COLONIC TUBE W/FLUORO  03/11/2019   IR GASTR TUBE CONVERT GASTR-JEJ PER W/FL MOD SED  04/30/2020   IR GENERIC HISTORICAL  01/07/2016   IR GJ TUBE CHANGE 01/07/2016 Jacqulynn Cadet, MD [facility]   IR GENERIC HISTORICAL  01/27/2016   IR MECH REMOV OBSTRUC MAT ANY COLON TUBE W/FLUORO 01/27/2016 Markus Daft, MD [facility]   IR GENERIC HISTORICAL  02/07/2016   IR PATIENT EVAL TECH 0-60 MINS Darrell K Allred, PA-C [facility]   IR GENERIC HISTORICAL  02/08/2016   IR [place] TUBE CHANGE 02/08/2016  Greggory Keen, MD [facility]   IR GENERIC HISTORICAL  01/06/2016   IR [place] TUBE CHANGE 01/06/2016 [facility] OUT REF   IR GENERIC HISTORICAL  05/02/2016   IR CM INJ ANY COLONIC TUBE W/FLUORO 05/02/2016 Arne Cleveland, MD [facility]   IR GENERIC HISTORICAL  05/15/2016   IR [place] TUBE CHANGE 05/15/2016 Sandi Mariscal, MD [facility]   IR GENERIC HISTORICAL  06/28/2016   IR [place] TUBE CHANGE 06/28/2016 [facility]   IR [place] TUBE CHANGE  02/20/2017   IR [place] TUBE CHANGE  05/10/2017   IR [place] TUBE CHANGE  07/06/2017   IR [place] TUBE CHANGE  08/02/2017   IR [place] TUBE CHANGE  10/15/2017   IR [place] TUBE CHANGE  12/26/2017   IR [place] TUBE CHANGE  04/08/2018   IR [place] TUBE CHANGE  06/19/2018   IR [place]  TUBE CHANGE  03/03/2019   IR GJ TUBE CHANGE  06/06/2019   IR GJ TUBE CHANGE  09/26/2019   IR GJ TUBE CHANGE  01/09/2020   IR GJ TUBE CHANGE  05/12/2020   IR GJ TUBE CHANGE  10/05/2020   IR GJ TUBE CHANGE  10/14/2020   IR GJ TUBE CHANGE  10/19/2020   IR PATIENT EVAL TECH 0-60 MINS  10/19/2016   IR PATIENT EVAL TECH 0-60 MINS  12/25/2016   IR PATIENT EVAL TECH 0-60 MINS  01/29/2017   IR PATIENT EVAL TECH 0-60 MINS  04/04/2017   IR PATIENT EVAL TECH 0-60 MINS  04/30/2017   IR PATIENT EVAL TECH 0-60 MINS  07/31/2017   IR REPLC DUODEN/JEJUNO TUBE PERCUT W/FLUORO  11/14/2016   LAPAROTOMY N/A 01/20/2015   Procedure: EXPLORATORY LAPAROTOMY;  Surgeon: Coralie Keens, MD;  Location: [facility];  Service: General;  Laterality: N/A;   LOWER EXTREMITY ANGIOGRAPHY Left 09/21/2016   Procedure: Lower Extremity Angiography;  Surgeon: Waynetta Sandy, MD;  Location: [facility];  Service: Cardiovascular;  Laterality: Left;   LOWER EXTREMITY ANGIOGRAPHY Right 06/27/2017   Procedure: Lower Extremity Angiography;  Surgeon: Waynetta Sandy, MD;  Location: [facility];  Service: Cardiovascular;  Laterality: Right;   LYSIS OF ADHESION N/A 01/20/2015   Procedure: LYSIS OF ADHESIONS < 1 HOUR;  Surgeon: Coralie Keens, MD;  Location: [facility];  Service: General;  Laterality: N/A;   PERIPHERAL VASCULAR BALLOON ANGIOPLASTY Left 09/21/2016   Procedure: Peripheral Vascular Balloon Angioplasty;  Surgeon: Waynetta Sandy, MD;  Location: [facility];  Service: Cardiovascular;  Laterality: Left;  drug coated balloon   PERIPHERAL VASCULAR BALLOON ANGIOPLASTY Right 06/27/2017   Procedure: PERIPHERAL VASCULAR BALLOON ANGIOPLASTY;  Surgeon: Waynetta Sandy, MD;  Location: [facility];  Service: Cardiovascular;  Laterality: Right;  SFA/TPTRUNK   PERIPHERAL VASCULAR INTERVENTION Left 10/20/2020   Procedure: PERIPHERAL VASCULAR INTERVENTION;  Surgeon: Cherre Robins, MD;  Location: [facility];  Service: Cardiovascular;  Laterality: Left;   TONSILLECTOMY     TRANSMETATARSAL AMPUTATION Right 11/14/2019   Procedure: TRANSMETATARSAL AMPUTATION RIGHT FOOT;  Surgeon: Edrick Kins, DPM;  Location: [facility];  Service: Podiatry;  Laterality: Right;   TUBAL LIGATION     VENTRAL HERNIA REPAIR  2015   incarcerated ventral hernia ([facility] 09/2013)/notes 01/10/2015   WOUND EXPLORATION Right 10/20/2020   Procedure: PRIMARY CLOSURE COMMON FEMORAL ARTERY;  Surgeon: Cherre Robins, MD;  [facility];  Service: Vascular;  Laterality: Right;    ROS:   General:  No weight loss, Fever, chills  HEENT: No recent headaches, no nasal bleeding, no visual changes, no sore throat  Neurologic: No dizziness, blackouts, seizures. No recent symptoms of stroke or mini- stroke. No recent episodes of slurred speech, or temporary blindness.  Cardiac: No recent episodes of chest pain/pressure, no shortness of breath at rest.  No shortness of breath with exertion.  Denies history of atrial fibrillation or irregular heartbeat  Vascular: No history of rest pain in feet.  No history of claudication.  No history of non-healing ulcer, No history of DVT   Pulmonary: No home oxygen, no productive cough, no  hemoptysis,  No asthma or wheezing  Musculoskeletal:  [ ]  Arthritis, [ ]  Low back pain,  [ ]  Joint pain  Hematologic:No history of hypercoagulable state.  No history of easy bleeding.  No history of anemia  Gastrointestinal: No hematochezia or melena,  No gastroesophageal  reflux, no trouble swallowing  Urinary: [ ]  chronic Kidney disease, [ ]  on HD - [ ]  MWF or [ ]  TTHS, [ ]  Burning with urination, [ ]  Frequent urination, [ ]  Difficulty urinating;   Skin: No rashes  Psychological: No history of anxiety,  No history of depression  Social History Social History   Tobacco Use   Smoking status: Never   Smokeless tobacco: Former    Types: Snuff    Quit date: 04/03/1980   Tobacco comments:    "used snuff in my younger days"  Vaping Use   Vaping Use: Never used  Substance Use Topics   Alcohol use: Never    Alcohol/week: 0.0 standard drinks   Drug use: Never    Family History Family History  Problem Relation Age of Onset   Stroke Mother    Hypertension Mother    Diabetes Brother    Glaucoma Son    Glaucoma Son    Heart attack Neg Hx    Colon cancer Neg Hx    Stomach cancer Neg Hx     Allergies  Allergies  Allergen Reactions   Penicillins Itching, Rash and Other (See Comments)    Tolerated amoxicillin, unasyn, zosyn & cephalosporins in the past. Did it involve swelling of the face/tongue/throat, SOB, or low BP? No Did it involve sudden or severe rash/hives, skin peeling, or any reaction on the inside of your mouth or nose? No Did you need to seek medical attention at a hospital or doctor's office? Unknown When did it last happen?      5+ years If all above answers are "NO", may proceed with cephalosporin use.      Current Outpatient Medications  Medication Sig Dispense Refill   acetaminophen (TYLENOL) 500 MG tablet Take 500 mg by mouth every 6 (six) hours as needed for moderate pain.     amLODipine (NORVASC) 5 MG tablet TAKE 1 TABLET BY MOUTH EVERY DAY 90 tablet 1    aspirin 81 MG EC tablet Take 1 tablet (81 mg total) by mouth daily. Swallow whole. 150 tablet 2   atorvastatin (LIPITOR) 10 MG tablet TAKE 1 TABLET BY MOUTH EVERY DAY 90 tablet 1   b complex vitamins tablet Take 1 tablet by mouth daily.      cholecalciferol (VITAMIN D3) 25 MCG (1000 UNIT) tablet Take 1,000 Units by mouth daily.     clopidogrel (PLAVIX) 75 MG tablet Take 1 tablet (75 mg total) by mouth daily. 90 tablet 3   cycloSPORINE (RESTASIS) 0.05 % ophthalmic emulsion USE 1 DROP INTO BOTH EYES TWICE DAILY 60 mL 3   DULoxetine (CYMBALTA) 30 MG capsule TAKE 1 CAPSULE BY MOUTH EVERY DAY 90 capsule 2   ferrous sulfate 220 (44 Fe) MG/5ML solution TAKE 7.4 MLS (325 MG TOTAL) BY MOUTH DAILY. 150 mL 11   fluticasone (FLONASE) 50 MCG/ACT nasal spray SPRAY 2 SPRAYS INTO EACH NOSTRIL EVERY DAY 48 mL 1   furosemide (LASIX) 40 MG tablet TAKE 0.5-1 TABLETS (20-40 MG TOTAL) BY MOUTH SEE ADMIN INSTRUCTIONS. TAKE 20 MG DAILY, MAY INSTEAD TAKE 40 MG DAILY AS NEEDED FOR EDEMA 30 tablet 8   gabapentin (NEURONTIN) 300 MG capsule Take 1 capsule (300 mg total) by mouth 2 (two) times daily. 180 capsule 1   gentamicin cream (GARAMYCIN) 0.1 % Apply 1 application topically daily as needed (dry skin). 30 g 1   hydrALAZINE (APRESOLINE) 50 MG tablet TAKE 1 TABLET BY MOUTH THREE TIMES A DAY 270 tablet 2  Insulin Pen Needle (B-D UF III MINI PEN NEEDLES) 31G X 5 MM MISC Use twice daily with giving insulin injections. Dx E11.9 300 each 3   ipratropium-albuterol (DUONEB) 0.5-2.5 (3) MG/3ML SOLN Take 3 mLs by nebulization every 6 (six) hours as needed (shortness of breath).     isosorbide mononitrate (IMDUR) 30 MG 24 hr tablet TAKE 2 TABLETS BY MOUTH DAILY. 180 tablet 2   latanoprost (XALATAN) 0.005 % ophthalmic solution Place 1 drop into both eyes at bedtime.     levalbuterol (XOPENEX HFA) 45 MCG/ACT inhaler Inhale 2 puffs into the lungs every 6 (six) hours as needed for wheezing.     levothyroxine (SYNTHROID) 125 MCG tablet  TAKE 1 TABLET BY MOUTH EVERY DAY 90 tablet 0   loratadine (CLARITIN) 10 MG tablet Take 10 mg by mouth daily.     mometasone-formoterol (DULERA) 200-5 MCG/ACT AERO INHALE TWO PUFFS BY MOUTH TWICE DAILY 13 each 5   Multiple Vitamin (MULTIVITAMIN WITH MINERALS) TABS tablet Take 1 tablet by mouth daily.     Nutritional Supplements (FEEDING SUPPLEMENT, OSMOLITE 1.5 CAL,) LIQD Place 711 mLs into feeding tube See admin instructions. Take 711 ml over a 12 hour period per day 9 am-9 pm at a rate of 65     nystatin (MYCOSTATIN) 100000 UNIT/ML suspension Take 5 mLs (500,000 Units total) by mouth 4 (four) times daily. 473 mL 0   nystatin (MYCOSTATIN/NYSTOP) powder Apply 1 application topically as needed (skin irritation). 15 g 0   nystatin cream (MYCOSTATIN) APPLY TO AFFECTED AREA TWICE A DAY 30 g 11   oxyCODONE (ROXICODONE) 5 MG immediate release tablet Take 0.5-1 tablets (2.5-5 mg total) by mouth every 8 (eight) hours as needed for severe pain. 30 tablet 0   pantoprazole (PROTONIX) 40 MG tablet Take 1 tablet (40 mg total) by mouth 2 (two) times daily. 180 tablet 2   Polyethyl Glycol-Propyl Glycol (SYSTANE) 0.4-0.3 % GEL ophthalmic gel Place 1 application into both eyes 2 (two) times daily as needed (moisture).     Probiotic Product (EQL PROBIOTIC COLON SUPPORT PO) Take 1 capsule by mouth daily.     senna-docusate (SENOKOT-S) 8.6-50 MG tablet Take 1 tablet by mouth as needed for mild constipation.     shark liver oil-cocoa butter (PREPARATION H) 0.25-3-85.5 % suppository Place 1 suppository rectally as needed for hemorrhoids.     traMADol (ULTRAM) 50 MG tablet Place 1 tablet (50 mg total) into feeding tube every 6 (six) hours as needed for severe pain. 30 tablet 0   Water For Irrigation, Sterile (FREE WATER) SOLN Place 180 mLs into feeding tube 3 (three) times daily.     White Petrolatum-Mineral Oil (SYSTANE NIGHTTIME) OINT Place 1 application into both eyes at bedtime.      No current facility-administered  medications for this visit.    Physical Examination  Vitals:   11/16/20 1457  BP: (!) 158/68  Pulse: 80  Temp: (!) 96.7 F (35.9 C)  TempSrc: Temporal  SpO2: 100%  Weight: 129 lb 11.2 oz (58.8 kg)  Height: 5\' 6"  (1.676 m)    Body mass index is 20.93 kg/m.  General:  Alert and oriented, no acute distress HEENT: Normal Neck: No bruit or JVD Pulmonary: Clear to auscultation bilaterally Cardiac: Regular Rate and Rhythm without murmur Abdomen: Soft, non-tender, non-distended, no mass, no scars Skin: No rash     Extremity Pulses:  2+ radial, brachial, femoral, doppler signals dorsalis pedis, posterior tibial bilaterally.  Right groin soft and incision has  healed well.  No evidence of Hematoma. Musculoskeletal: mild left LE edema  Neurologic: Upper and lower extremity motor 5/5 and symmetric  DATA:  ABI Findings:  +---------+------------------+-----+----------+--------------------------+  Right    Rt Pressure (mmHg)IndexWaveform  Comment                     +---------+------------------+-----+----------+--------------------------+  Brachial 178                                                          +---------+------------------+-----+----------+--------------------------+  PTA      189               1.06 monophasic                            +---------+------------------+-----+----------+--------------------------+  DP       164               0.92 monophasic                            +---------+------------------+-----+----------+--------------------------+  Great Toe                                 Transmetatarsal amputation  +---------+------------------+-----+----------+--------------------------+   +---------+------------------+-----+----------+-----------+  Left     Lt Pressure (mmHg)IndexWaveform  Comment      +---------+------------------+-----+----------+-----------+  Brachial 175                                            +---------+------------------+-----+----------+-----------+  PTA      255               1.43 monophasic             +---------+------------------+-----+----------+-----------+  DP       205               1.15 biphasic               +---------+------------------+-----+----------+-----------+  Great Toe                                 Open wounds  +---------+------------------+-----+----------+-----------+   +-------+-------------+--------------------+-------------+-----------------  ----+  ABI/TBIToday's ABI  Today's TBI         Previous ABI Previous TBI            +-------+-------------+--------------------+-------------+-----------------  ----+  Right  1.06         Transmetatarsal     1.12         Transmetatarsal                              amputation                       amputation              +-------+-------------+--------------------+-------------+-----------------  ----+  Left   Non          Open wound  Non          Open wound                     compressible                     compressible                         +-------+-------------+--------------------+-------------+-----------------  ----+      Arterial wall calcification precludes accurate ankle pressures and ABIs.  Bilateral ABIs appear essentially unchanged compared to prior study on  09/10/2020.     Summary:  Right: Resting right ankle-brachial index is within normal range. No  evidence of significant right lower extremity arterial disease. The right  toe-brachial index is abnormal.   Left: Resting left ankle-brachial index indicates noncompressible left  lower extremity arteries. The left toe-brachial index is abnormal.        +--------+--------+-----+--------+--------+--------+  RIGHT   PSV cm/sRatioStenosisWaveformComments  +--------+--------+-----+--------+--------+--------+  CFA Prox159                  biphasic           +--------+--------+-----+--------+--------+--------+   Multichamber hematoma seen superficial to the proximal SFA. No flow seen  within. No evidence of pseudo aneurysm.       +----------+--------+-----+--------+--------+--------+  LEFT      PSV cm/sRatioStenosisWaveformComments  +----------+--------+-----+--------+--------+--------+  CFA Prox  198                  biphasic          +----------+--------+-----+--------+--------+--------+  DFA       79                   biphasic          +----------+--------+-----+--------+--------+--------+  SFA Prox  115                  biphasic          +----------+--------+-----+--------+--------+--------+  SFA Mid   143                  biphasic          +----------+--------+-----+--------+--------+--------+  SFA Distal183                  biphasic          +----------+--------+-----+--------+--------+--------+  POP Prox  94                   biphasic          +----------+--------+-----+--------+--------+--------+  POP Mid   104                  biphasic          +----------+--------+-----+--------+--------+--------+  POP Distal107                  biphasic          +----------+--------+-----+--------+--------+--------+   No evidence of restenosis at the popliteal artery PTA site.     Summary:  Right: Hematoma seen at right groin.   Left: No evidence of restenosis at the popliteal artery PTA site.   ASSESSMENT/Plan: PAD now with maximum inflow to help the left medial ulcer 1st MTP head S/P Left popliteal angioplasty.  Duplex shows the artery is patent without evidence of restenosis. ABI's show calcified vessels without significant changes.  She will f/u with DR. Evans.  I will  have her f/u with [place] in 6 months for ABI surveillance.  If problems or concerns develop she will call before the next appointment.        Roxy Horseman PA-C [facility] Office: [phone]  MD in clinic [place]

## 2020-11-17 ENCOUNTER — Other Ambulatory Visit: Payer: Self-pay

## 2020-11-17 ENCOUNTER — Other Ambulatory Visit: Payer: Self-pay | Admitting: *Deleted

## 2020-11-17 DIAGNOSIS — I70245 Atherosclerosis of native arteries of left leg with ulceration of other part of foot: Secondary | ICD-10-CM

## 2020-11-17 NOTE — Patient Outreach (Signed)
[facility]) Care Management  11/17/2020  Shannon [name] 09-27-1923 [id number]   Outgoing call placed to member, state she is doing well.  Denies any urgent concerns, encouraged to contact this care manager with questions.  Agrees to follow up within the next month.   Goals Addressed             This Visit's Progress    [name] - Make and Keep All Appointments   On track      Timeframe:  Short-Term Goal Priority:  Medium Start Date:       5/4           Expected End Date:     8/29  Barriers: Knowledge                  - ask family or friend for a ride - call to cancel if needed - keep a calendar with appointment dates    Why is this important?   Part of staying healthy is seeing the doctor for follow-up care.  If you forget your appointments, there are some things you can do to stay on track.    Notes:   11/22 - Upcoming appointments reviewed with member  12/21 - reminded of follow up with podiatrist  2/7 - Reviewed upcoming appointments with member, PCP on 3/9, podiatrist on 3/16  4/6 - Encouraged to follow up with GI regarding feeding tube and test results  5/4 - Continues to have issues with feet, particularly left foot bunion.  Appointment with podiatrist on 5/18, will continue to use cream and contact provider if in need of sooner visit  6/7 - follow up with podiatrist tomorrow and 6/22.  Still has hardened area on right foot and cellulitis on left leg/foot.  Appointment with PCP on 7/11  6/29 - attended 6/22 podiatry appointment, will need another surgery.  PCP appointment 6/23 clearing her for surgery.  Awaiting call from vascular specialist for appointment prior to surgery date   7/15 - Was seen by vascular surgeon on 7/5, will have intervention next week.  Hospital follow up with PCP later today.  7/25 - Upcoming visits with PCP on 8/8, Podiatry on 8/15, and vascular on 8/16.   8/17 - Vascular appointment done 8/16.  Was also seen by  podiatry on 8/15, will follow up on 9/12.       [name] - Perform Foot Care   On track    Barriers: Knowledge   Timeframe:  Long-Range Goal Priority:  High Start Date:       6/7              Expected End Date:    9/7          - check feet daily for cuts, sores or redness - wash and dry feet carefully every day - wear comfortable, cotton socks - wear comfortable, well-fitting shoes    Why is this important?   Good foot care is very important when you have diabetes.  There are many things you can do to keep your feet healthy and catch a problem early.    Notes:   11/22 - Reviewed updated wound care per most recent MD visit  12/21 - Discussed ongoing wound care with member  2/7 - Reminded of wound care per podiatrist, encouraged to keep wound clean and dry  6/7 - Seen in ED for cellulitis of left foot/leg, completed course of antibiotics, still has swelling.    6/29 -  Patient is now needing transmetatarsal removal of the left foot due to non-healing wound and infection.  Surgery scheduled for 7/8  7/15 - Amputation surgery has been postponed, will now have vascular intervention, scheduled for 7/20.  Member denies questions regarding procedure  7/25 - Vascular procedure completed in effort to not have further amputation.  Will continue daily foot care and follow up  8/17 - Vascular follow up completed yesterday, report surgery was successful, more blood flow noted.  Amputation on right foot has been placed on hold. Still have healing areas on left foot, minimal draninage       Kemper Durie, RN, MSN [facility] Care Management  [facility] Manager [phone]

## 2020-11-19 IMAGING — XA JEJUNAL CATHETER REPLACEMENT
2 series · 7 of 7 positions shown · non-contrast
Comparison: none

INDICATION: [AGE] female with a history of nonfunctional percutaneous
gastrojejunostomy

[Series 2: fl - angio · 4 of 27 frames shown]
[frame 5/27]
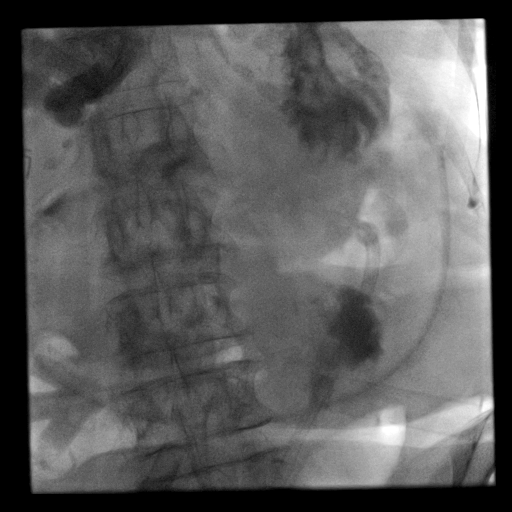
[frame 9/27]
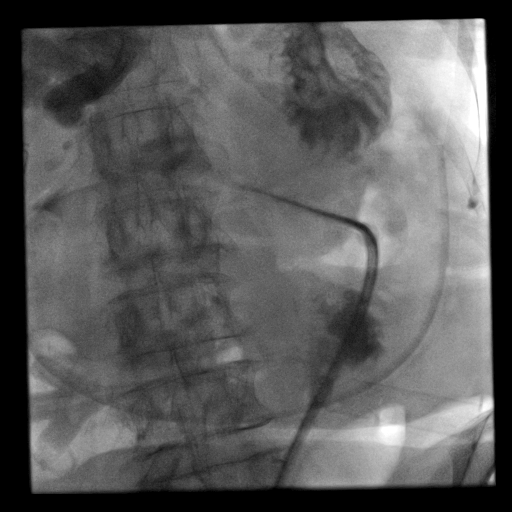
[frame 14/27]
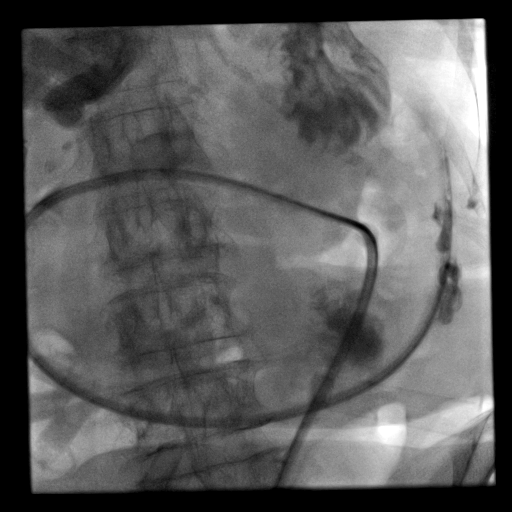
[frame 23/27]
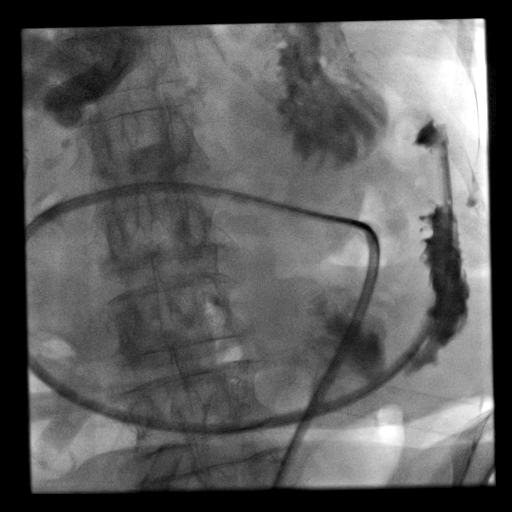

[Series 300: tube placements · 3 of 3 slices shown]
[im 1/3]
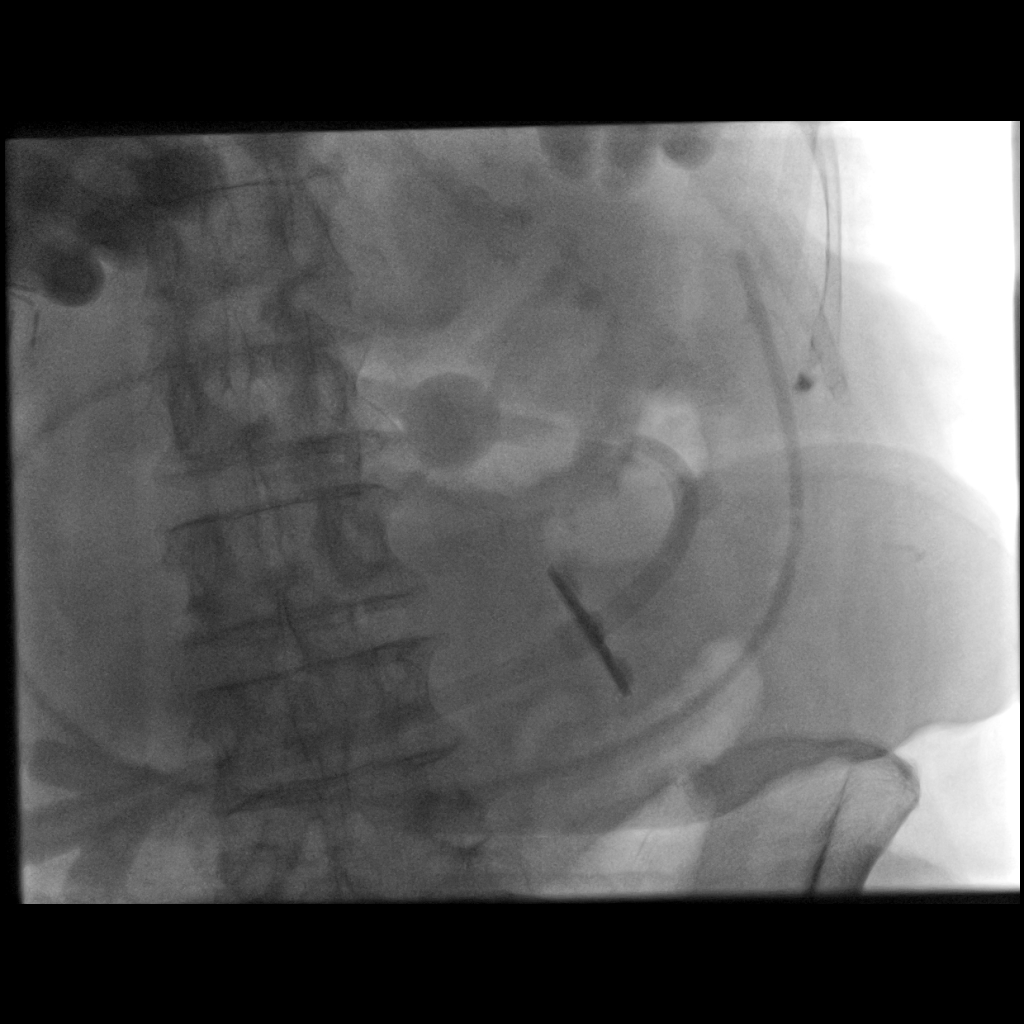
[im 2/3]
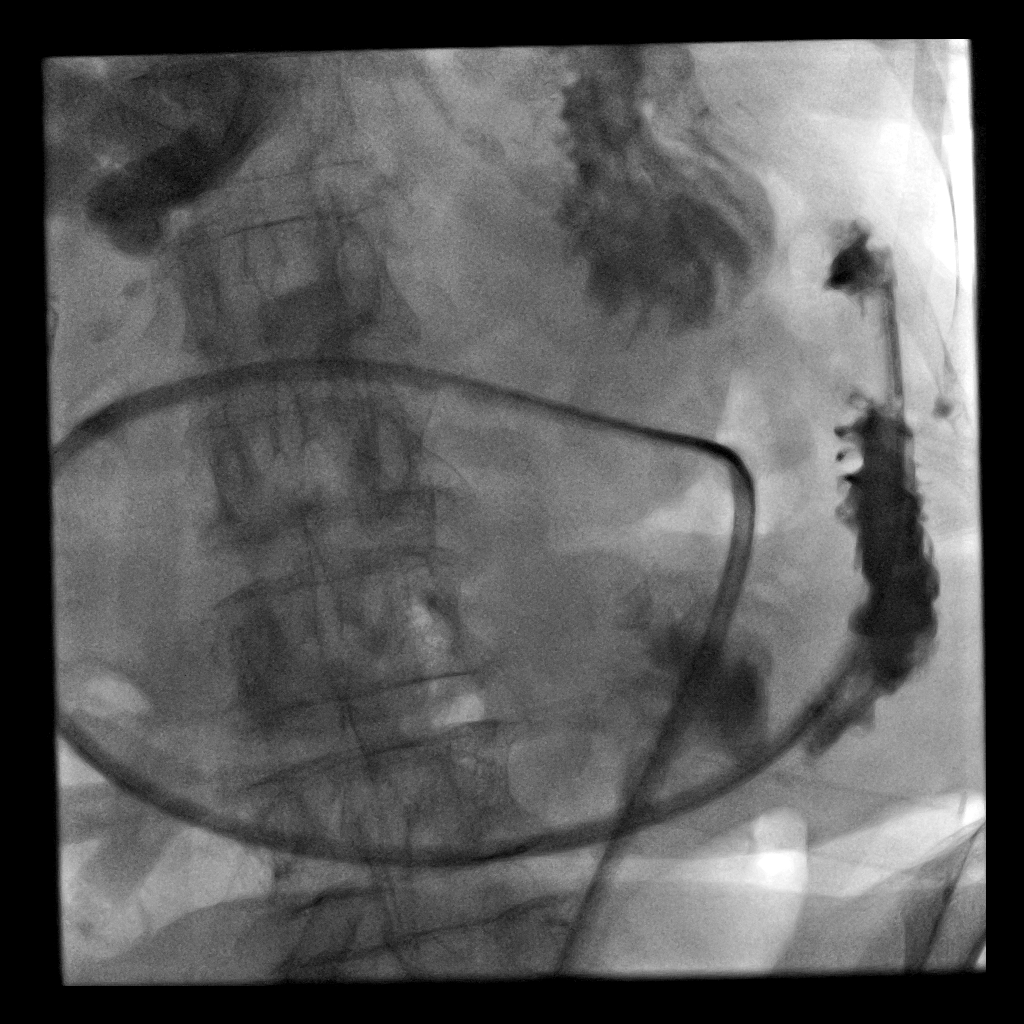
[im 3/3]
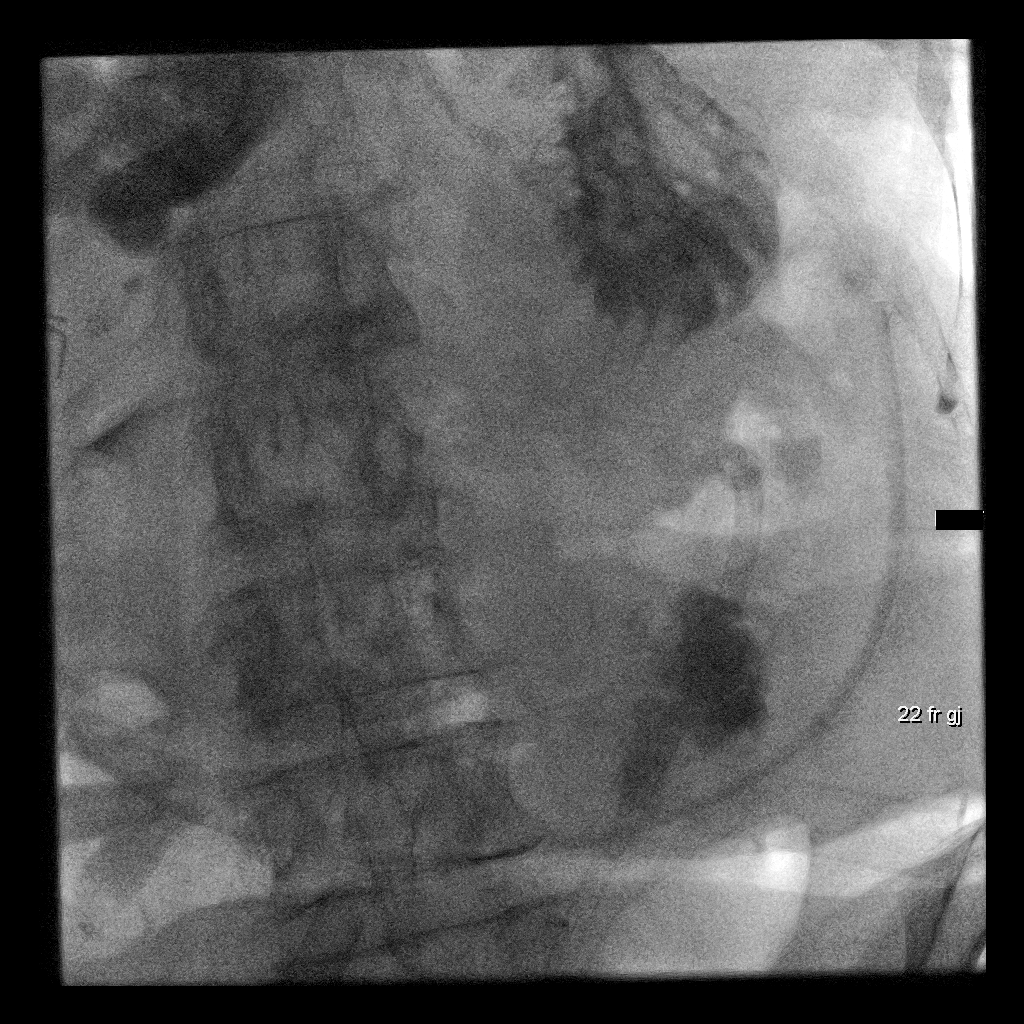

[7 of 7 positions shown; findings below may reference images not displayed]

EXAM:
IMAGE GUIDED REPLACEMENT OF GASTROJEJUNOSTOMY

MEDICATIONS:
None

ANESTHESIA/SEDATION:
None

CONTRAST:  Twenty cc-administered into the gastric lumen.

FLUOROSCOPY TIME:  Fluoroscopy Time: 7 minutes 24 seconds (54 mGy).

COMPLICATIONS:
None

PROCEDURE:
Informed written consent was obtained from the patient and the
patient's family after a thorough discussion of the procedural
risks, benefits and alternatives. All questions were addressed.
Maximal Sterile Barrier Technique was utilized including caps, mask,
sterile gowns, sterile gloves, sterile drape, hand hygiene and skin
antiseptic. A timeout was performed prior to the initiation of the
procedure.

The epigastrium was prepped with Betadine in a sterile fashion, and
a sterile drape was applied covering the operative field. Indwelling
drain was prepped.

Ligation of the indwelling drain was performed for deflation of the
balloon. A stiff Amplatz wire was then attempted to passage through
the jejunal limb of the gastrojejunostomy. Both the front and the
back end were used, with no ability to pass through the concretions
of the lumen.

A stiff Glidewire attempted to pass which was unsuccessful.

We then used a coaxial method with a stiff Glidewire through a Kumpe
the catheter to navigate through the ostomy adjacent to the
indwelling feeding tube, and through the pylorus.

Once the Kumpe the catheter was adjacent to the distal end of the
indwelling tube, contrast injected confirming location. The
Glidewire was then used to pass a new 22 French 45 cm
gastrojejunostomy with the enfit connection, after removing the
indwelling GJ tube.

Final images were stored.

Patient tolerated the procedure well and remained hemodynamically
stable throughout.

No complications were encountered and no significant blood loss
encountered.
IMPRESSION: Status post image guided exchange of gastrojejunostomy.

## 2020-11-26 ENCOUNTER — Other Ambulatory Visit: Payer: Self-pay | Admitting: Nurse Practitioner

## 2020-12-04 ENCOUNTER — Other Ambulatory Visit: Payer: Self-pay | Admitting: Nurse Practitioner

## 2020-12-09 ENCOUNTER — Other Ambulatory Visit: Payer: Self-pay | Admitting: Nurse Practitioner

## 2020-12-09 ENCOUNTER — Other Ambulatory Visit: Payer: Self-pay | Admitting: *Deleted

## 2020-12-09 MED ORDER — FLUTICASONE PROPIONATE 50 MCG/ACT NA SUSP: NASAL | 2 refills | Status: DC

## 2020-12-09 NOTE — Telephone Encounter (Signed)
Shannon [name] called requesting a refill.  Last RF Transmission Failed.

## 2020-12-13 ENCOUNTER — Other Ambulatory Visit: Payer: Self-pay | Admitting: Podiatry

## 2020-12-13 ENCOUNTER — Other Ambulatory Visit: Payer: Self-pay | Admitting: *Deleted

## 2020-12-13 ENCOUNTER — Ambulatory Visit (INDEPENDENT_AMBULATORY_CARE_PROVIDER_SITE_OTHER): Payer: Medicare Other | Admitting: Podiatry

## 2020-12-13 ENCOUNTER — Ambulatory Visit (INDEPENDENT_AMBULATORY_CARE_PROVIDER_SITE_OTHER): Payer: Medicare Other

## 2020-12-13 ENCOUNTER — Other Ambulatory Visit: Payer: Self-pay

## 2020-12-13 DIAGNOSIS — I739 Peripheral vascular disease, unspecified: Secondary | ICD-10-CM | POA: Diagnosis not present

## 2020-12-13 DIAGNOSIS — I70235 Atherosclerosis of native arteries of right leg with ulceration of other part of foot: Secondary | ICD-10-CM | POA: Diagnosis not present

## 2020-12-13 DIAGNOSIS — E0859 Diabetes mellitus due to underlying condition with other circulatory complications: Secondary | ICD-10-CM | POA: Diagnosis not present

## 2020-12-13 DIAGNOSIS — L97512 Non-pressure chronic ulcer of other part of right foot with fat layer exposed: Secondary | ICD-10-CM

## 2020-12-13 DIAGNOSIS — M86179 Other acute osteomyelitis, unspecified ankle and foot: Secondary | ICD-10-CM

## 2020-12-13 DIAGNOSIS — L97522 Non-pressure chronic ulcer of other part of left foot with fat layer exposed: Secondary | ICD-10-CM | POA: Diagnosis not present

## 2020-12-13 NOTE — Patient Outreach (Signed)
[facility]) Care Management  12/13/2020  Shannon [name] 1924-01-30 [id number]   Outgoing call placed to member, no answer.  Unable to leave voice message as mailbox is full.  Will follow up within the next 3-4 business days.  [name], [place], MSN [place] [phone]

## 2020-12-14 MED ORDER — DOXYCYCLINE HYCLATE 100 MG PO TABS: 100.0000 mg | ORAL_TABLET | Freq: Two times a day (BID) | ORAL | 1 refills | Status: DC

## 2020-12-14 NOTE — Progress Notes (Signed)
Subjective:  Patient presents today for follow-up evaluation and treatment regarding ulcers to the bilateral feet.  Patient states that today clinically the ulcers appear to be somewhat improved.  They have been applying Betadine ointment and dry sterile dressings daily to the wounds.  Unfortunately the patient has been in and out of the hospital for several health related issues and she states that she is exhausted from being in the hospital so much.  She presents for further treatment evaluation  Past Medical History:  Diagnosis Date   Acute respiratory failure ([place])    Anemia    previous blood transfusions   Arthritis    "all over"   Asthma    Atrial flutter (HCC)    Bradycardia    requiring previous d/c of BB and reduction of amiodarone   CAD (coronary artery disease)    nonobstructive per notes   Chronic diastolic CHF (congestive heart failure) ([place])    CKD (chronic kidney disease), stage III ([place])    Complication of blood transfusion    "got the wrong blood type at [facility] in ~ 2015; no adverse reaction that we are aware of"/daughter, [name] (01/27/2016)   COPD (chronic obstructive pulmonary disease) ([place])    Depression    "light case"   DVT (deep venous thrombosis) ([place]) 01/2016   a. LLE DVT 01/2016 - switched from Eliquis to Coumadin.   Dyspnea    with some activity   Gastric stenosis    a. s/p stomach tube   GERD (gastroesophageal reflux disease)    Headache    History of 2019 novel coronavirus disease (COVID-19)    History of blood transfusion    "several" (01/27/2016)   History of stomach ulcers    Hyperlipidemia    Hypertension    Hypothyroidism    On home oxygen therapy    "2L; 9PM til 9AM" (06/27/2017)   PAD (peripheral artery disease) (HCC)    a. prior gangrene, toe amputations, intervention.   PAF (paroxysmal atrial fibrillation) ([place])    Paraesophageal hernia    Perforated gastric ulcer ([place])    Pneumonia    "a few times" (06/27/2017)   Seasonal  allergies    SIADH (syndrome of inappropriate ADH production) ([place])    [name] 01/10/2015   Small bowel obstruction ([place])    "I don't know how many" (01/11/2015)   Stroke ([place])    several- no residual   Type II diabetes mellitus ([place])    "related to prednisone use  for > 20 yr; once predinose stopped; no more DM RX" (01/27/2016)   UTI (urinary tract infection) 02/08/2016   Ventral hernia with bowel obstruction      Objective: Physical Exam General: The patient is alert and oriented x3 in no acute distress.  Dermatology: Ulcer noted to the medial aspect of the first MTPJ left which appears somewhat improved since last visit.  There continues to be drainage noted.  There is exposed joint capsule and possible bone to the area and correlates with the radiographic exam of osteomyelitis to this area  Vascular: Patient recently underwent balloon angioplasty with vein and vascular  VAS [place] ABI W/WO TBI 09/10/2020 Right: Resting right ankle-brachial index is within normal range using the  posterior tibial artery . No evidence of significant right lower extremity  arterial disease.   Left: Resting left ankle-brachial index indicates noncompressible left  lower extremity arteries. Unable to obtain left great toe index due to  bandage.   Neurological: Epicritic and protective  threshold diminished bilaterally.   Musculoskeletal Exam: Status post TMA right foot.  Hallux valgus left with overlying ulcer and degenerative changes possible exposed bone and joint capsule.  Second and third toe amputation left.  Radiographic exam today 12/13/2020:  There continues to be left foot demonstration of history of left second and third toe amputation with resection of the metatarsal heads as well and hallux valgus deformity.  Diffuse degenerative changes noted.  No gas within the tissue.  Cortical erosive changes noted to the medial aspect of the first metatarsal head just underlying the area of the wound which  is consistent with osteomyelitis of the distal portion of the first metatarsal.  Right foot demonstrates ulcer at the distal stump of the second metatarsal with some osteolytic changes at the distal portion of the metatarsal consistent with findings of osteomyelitis  Assessment: 1. s/p transmetatarsal amputation right foot. DOS: 11/14/2019 2.  Ulcer right forefoot amputation stump 3.  Hallux valgus left with ulcer down to level bone and joint. H/o 2nd toe amputation left 4.  Osteomyelitis first metatarsal left foot as well as the amputation site of the second metatarsal right foot amputation stump with overlying ulcer  Plan of Care:  1. Patient was evaluated.  2.  Continue Betadine with dry sterile dressings daily bilateral  3.  Continue diabetic shoes with metatarsal pads to offload pressure from the right forefoot amputation stump 4.  Today I explained the concern of osteomyelitis findings to the bilateral feet.  Understandably the patient is very depressed with the discussion of returning to the OR for more proximal amputation to the bilateral feet.  She reiterated how exhausted she is from being in and out of the hospital and surgery given her age. 5.  Cultures were taken of the right foot amputation stump draining wound 6.  Prescription for doxycycline 100 mg 2 times daily 7.  Since the patient would like to hold off on surgery for now I will consult infectious disease to see if they can help manage the osteomyelitis and prevent spread, although I did explain to the patient that the bone infection will not resolve with antibiotics alone 8.  We may also place an order for MRI bilateral feet to determine the extent of the infection 9.  Return to clinic in 3 weeks  Edrick Kins, DPM [facility]  Dr. Edrick Kins, DPM    [address], [place] [address]                Office [phone]  Fax [phone]

## 2020-12-14 NOTE — Addendum Note (Signed)
Addended by: Edrick Kins on: 12/14/2020 08:49 AM   Modules accepted: Orders

## 2020-12-16 ENCOUNTER — Other Ambulatory Visit: Payer: Self-pay | Admitting: *Deleted

## 2020-12-16 ENCOUNTER — Other Ambulatory Visit: Payer: Self-pay | Admitting: Nurse Practitioner

## 2020-12-16 DIAGNOSIS — D638 Anemia in other chronic diseases classified elsewhere: Secondary | ICD-10-CM

## 2020-12-16 NOTE — Patient Outreach (Signed)
[facility]) Care Management  [facility] Care Manager  12/17/2020   Shannon [name] November 24, 1923 [id number]   Outreach attempt #2, successful to member.  Voices concern regarding ongoing issues with her feet, but denies any urgent complaints.  Encounter Medications:  Outpatient Encounter Medications as of 12/16/2020  Medication Sig Note   acetaminophen (TYLENOL) 500 MG tablet Take 500 mg by mouth every 6 (six) hours as needed for moderate pain.    amLODipine (NORVASC) 5 MG tablet TAKE 1 TABLET BY MOUTH EVERY DAY    aspirin 81 MG EC tablet Take 1 tablet (81 mg total) by mouth daily. Swallow whole.    atorvastatin (LIPITOR) 10 MG tablet TAKE 1 TABLET BY MOUTH EVERY DAY    b complex vitamins tablet Take 1 tablet by mouth daily.     cholecalciferol (VITAMIN D3) 25 MCG (1000 UNIT) tablet Take 1,000 Units by mouth daily.    clopidogrel (PLAVIX) 75 MG tablet Take 1 tablet (75 mg total) by mouth daily.    cycloSPORINE (RESTASIS) 0.05 % ophthalmic emulsion USE 1 DROP INTO BOTH EYES TWICE DAILY 10/10/2020: LF 05/25, 30DS   doxycycline (VIBRA-TABS) 100 MG tablet Take 1 tablet (100 mg total) by mouth 2 (two) times daily.    DULoxetine (CYMBALTA) 30 MG capsule TAKE 1 CAPSULE BY MOUTH EVERY DAY 10/10/2020: LF 05/03, 90DS   fluticasone (FLONASE) 50 MCG/ACT nasal spray 2 sprays into each nostril daily.    furosemide (LASIX) 40 MG tablet TAKE 0.5-1 TABLETS (20-40 MG TOTAL) BY MOUTH SEE ADMIN INSTRUCTIONS. TAKE 20 MG DAILY, MAY INSTEAD TAKE 40 MG DAILY AS NEEDED FOR EDEMA 10/10/2020: LF 05/04, 90DS   gabapentin (NEURONTIN) 300 MG capsule TAKE 1 CAPSULE BY MOUTH TWICE A DAY    gentamicin cream (GARAMYCIN) 0.1 % Apply 1 application topically daily as needed (dry skin).    hydrALAZINE (APRESOLINE) 50 MG tablet TAKE 1 TABLET BY MOUTH THREE TIMES A DAY 10/10/2020: LF 04/25, 90DS   Insulin Pen Needle (B-D UF III MINI PEN NEEDLES) 31G X 5 MM MISC Use twice daily with giving insulin injections. Dx E11.9     ipratropium-albuterol (DUONEB) 0.5-2.5 (3) MG/3ML SOLN Take 3 mLs by nebulization every 6 (six) hours as needed (shortness of breath).    isosorbide mononitrate (IMDUR) 30 MG 24 hr tablet TAKE 2 TABLETS BY MOUTH DAILY. 10/10/2020: LF 06/20, 90DS   latanoprost (XALATAN) 0.005 % ophthalmic solution Place 1 drop into both eyes at bedtime.    levalbuterol (XOPENEX HFA) 45 MCG/ACT inhaler Inhale 2 puffs into the lungs every 6 (six) hours as needed for wheezing.    levothyroxine (SYNTHROID) 125 MCG tablet TAKE 1 TABLET BY MOUTH EVERY DAY    loratadine (CLARITIN) 10 MG tablet Take 10 mg by mouth daily.    mometasone-formoterol (DULERA) 200-5 MCG/ACT AERO INHALE TWO PUFFS BY MOUTH TWICE DAILY 10/10/2020: LF 07/05   Multiple Vitamin (MULTIVITAMIN WITH MINERALS) TABS tablet Take 1 tablet by mouth daily.    Nutritional Supplements (FEEDING SUPPLEMENT, OSMOLITE 1.5 CAL,) LIQD Place 711 mLs into feeding tube See admin instructions. Take 711 ml over a 12 hour period per day 9 am-9 pm at a rate of 65    nystatin (MYCOSTATIN) 100000 UNIT/ML suspension Take 5 mLs (500,000 Units total) by mouth 4 (four) times daily. 10/10/2020: LF 06/06, 23DS   nystatin (MYCOSTATIN/NYSTOP) powder Apply 1 application topically as needed (skin irritation).    nystatin cream (MYCOSTATIN) APPLY TO AFFECTED AREA TWICE A DAY    oxyCODONE (ROXICODONE)  5 MG immediate release tablet Take 0.5-1 tablets (2.5-5 mg total) by mouth every 8 (eight) hours as needed for severe pain.    pantoprazole (PROTONIX) 40 MG tablet Take 1 tablet (40 mg total) by mouth 2 (two) times daily.    Polyethyl Glycol-Propyl Glycol (SYSTANE) 0.4-0.3 % GEL ophthalmic gel Place 1 application into both eyes 2 (two) times daily as needed (moisture).    Probiotic Product (EQL PROBIOTIC COLON SUPPORT PO) Take 1 capsule by mouth daily.    senna-docusate (SENOKOT-S) 8.6-50 MG tablet Take 1 tablet by mouth as needed for mild constipation.    shark liver oil-cocoa butter  (PREPARATION H) 0.25-3-85.5 % suppository Place 1 suppository rectally as needed for hemorrhoids.    traMADol (ULTRAM) 50 MG tablet Place 1 tablet (50 mg total) into feeding tube every 6 (six) hours as needed for severe pain.    Water For Irrigation, Sterile (FREE WATER) SOLN Place 180 mLs into feeding tube 3 (three) times daily.    White Petrolatum-Mineral Oil (SYSTANE NIGHTTIME) OINT Place 1 application into both eyes at bedtime.     [DISCONTINUED] ferrous sulfate 220 (44 Fe) MG/5ML solution TAKE 7.4 MLS (325 MG TOTAL) BY MOUTH DAILY. 10/10/2020: LF 06/13, 20DS   No facility-administered encounter medications on file as of 12/16/2020.    Functional Status:  In your present state of health, do you have any difficulty performing the following activities: 10/01/2020  Hearing? Y  Vision? N  Difficulty concentrating or making decisions? N  Walking or climbing stairs? Y  Dressing or bathing? N  Doing errands, shopping? N  Some recent data might be hidden    Fall/Depression Screening: Fall Risk  11/16/2020 11/08/2020 10/15/2020  Falls in the past year? 1 0 0  Number falls in past yr: 0 0 0  Injury with Fall? 0 0 0  Risk for fall due to : - No Fall Risks No Fall Risks  Follow up - Falls evaluation completed Falls evaluation completed   PHQ 2/9 Scores 10/21/2019 07/21/2019 10/10/2018 08/07/2018 10/05/2017 02/28/2017 10/25/2016  PHQ - 2 Score 0 0 0 0 0 0 -  Exception Documentation - - - - - - Other- indicate reason in comment box  Not completed - - - - - - call completed with dtr    Assessment:   Care Plan Care Plan : General Plan of Care (Adult)  Updates made by Kemper Durie, RN since 12/17/2020 12:00 AM     Problem: Quality of Life (General Plan of Care)   Note:   Wound healing     Long-Range Goal: Quality of Life Maintained evidenced by patient's ability to perform all ADL's independently within the next 3 months   Start Date: 12/16/2020  Expected End Date: 03/17/2021  This Visit's  Progress: Not on track  Recent Progress: On track  Priority: Medium  Note:   Promoted activities to decrease social isolation such as group support or social, leisure and recreational activities, employment, use of social media; consider safety concerns about being out of home for activities.   Determined level of modifiable health risk.  4/6 - Assessed and monitored for signs/symptoms of psychosocial concerns, especially depression or ideations regarding harm to others or self; provide or refer for mental health services as needed.   5/4 - Assessed patient's thoughts about quality of life, goals and expectations, and dissatisfaction or desire to improve.   Identified issues of primary importance such as mental health, illness, exercise tolerance, pain, sexual function and intimacy, cognitive  change, social isolation, finances and relationships.    6/7 - Goal extended, still having issues with leg/foot, needing help from daughter  7/15 - has had setbacks and hospitalizations, will continue to work on independence  9/15 - Remains unable to fully function independently with ongoing medical issues.  Goal restarted    Task: Support and Maintain Acceptable Degree of Health, Comfort and Happiness   Due Date: 03/17/2021  Note:   Care Management Activities:    - affirmation provided - social relationships promoted - wellness behaviors promoted    Notes:       Goals Addressed             This Visit's Progress    [name] - Make and Keep All Appointments   On track      Timeframe:  Short-Term Goal Priority:  Medium Start Date:       9/15      Expected End Date:     11/15 (goal reset)  Barriers: Knowledge                  - ask family or friend for a ride - call to cancel if needed - keep a calendar with appointment dates    Why is this important?   Part of staying healthy is seeing the doctor for follow-up care.  If you forget your appointments, there are some things you  can do to stay on track.    Notes:   11/22 - Upcoming appointments reviewed with member  12/21 - reminded of follow up with podiatrist  2/7 - Reviewed upcoming appointments with member, PCP on 3/9, podiatrist on 3/16  4/6 - Encouraged to follow up with GI regarding feeding tube and test results  5/4 - Continues to have issues with feet, particularly left foot bunion.  Appointment with podiatrist on 5/18, will continue to use cream and contact provider if in need of sooner visit  6/7 - follow up with podiatrist tomorrow and 6/22.  Still has hardened area on right foot and cellulitis on left leg/foot.  Appointment with PCP on 7/11  6/29 - attended 6/22 podiatry appointment, will need another surgery.  PCP appointment 6/23 clearing her for surgery.  Awaiting call from vascular specialist for appointment prior to surgery date   7/15 - Was seen by vascular surgeon on 7/5, will have intervention next week.  Hospital follow up with PCP later today.  7/25 - Upcoming visits with PCP on 8/8, Podiatry on 8/15, and vascular on 8/16.   8/17 - Vascular appointment done 8/16.  Was also seen by podiatry on 8/15, will follow up on 9/12.    9/15 - Podiatry appointment complete, had x-ray done that showed ongoing infection in feet.  Will have MRI on 9/19 as well as appointment with ID on 9/19.       [name] - Perform Foot Care   On track    Barriers: Knowledge   Timeframe:  Long-Range Goal Priority:  High Start Date:       6/7              Expected End Date:    12/15 (goal extended)       - check feet daily for cuts, sores or redness - wash and dry feet carefully every day - wear comfortable, cotton socks - wear comfortable, well-fitting shoes    Why is this important?   Good foot care is very important when you have diabetes.  There are many  things you can do to keep your feet healthy and catch a problem early.    Notes:   11/22 - Reviewed updated wound care per most recent MD  visit  12/21 - Discussed ongoing wound care with member  2/7 - Reminded of wound care per podiatrist, encouraged to keep wound clean and dry  6/7 - Seen in ED for cellulitis of left foot/leg, completed course of antibiotics, still has swelling.    6/29 - Patient is now needing transmetatarsal removal of the left foot due to non-healing wound and infection.  Surgery scheduled for 7/8  7/15 - Amputation surgery has been postponed, will now have vascular intervention, scheduled for 7/20.  Member denies questions regarding procedure  7/25 - Vascular procedure completed in effort to not have further amputation.  Will continue daily foot care and follow up  8/17 - Vascular follow up completed yesterday, report surgery was successful, more blood flow noted.  Amputation on right foot has been placed on hold. Still have healing areas on left foot, minimal drainage  9/15 - Per podiatry appointment, member still has infection in both feet.  She will have MRI on 9/19, also placed back on antibiotics.  Blood sugars are stable, 158 today.  Member aware of importance of glucose management in reference to wound healing.        Plan:  Follow-up: Patient agrees to Care Plan and Follow-up. Follow-up in 1 month(s).  Kemper Durie, [place], MSN [facility] Care Management  [facility] Manager [phone]

## 2020-12-17 LAB — WOUND CULTURE
MICRO NUMBER:: 12360743
SPECIMEN QUALITY:: ADEQUATE

## 2020-12-17 LAB — HOUSE ACCOUNT TRACKING

## 2020-12-20 ENCOUNTER — Ambulatory Visit (INDEPENDENT_AMBULATORY_CARE_PROVIDER_SITE_OTHER): Payer: Medicare Other | Admitting: Infectious Diseases

## 2020-12-20 ENCOUNTER — Encounter: Payer: Self-pay | Admitting: Infectious Diseases

## 2020-12-20 ENCOUNTER — Other Ambulatory Visit: Payer: Self-pay

## 2020-12-20 VITALS — BP 166/56 | HR 85 | Temp 98.1°F | Wt 128.0 lb

## 2020-12-20 DIAGNOSIS — Z5181 Encounter for therapeutic drug level monitoring: Secondary | ICD-10-CM | POA: Diagnosis not present

## 2020-12-20 DIAGNOSIS — E11621 Type 2 diabetes mellitus with foot ulcer: Secondary | ICD-10-CM | POA: Diagnosis not present

## 2020-12-20 DIAGNOSIS — M869 Osteomyelitis, unspecified: Secondary | ICD-10-CM

## 2020-12-20 DIAGNOSIS — L97513 Non-pressure chronic ulcer of other part of right foot with necrosis of muscle: Secondary | ICD-10-CM

## 2020-12-20 MED ORDER — AMOXICILLIN-POT CLAVULANATE 500-125 MG PO TABS: 1.0000 | ORAL_TABLET | Freq: Two times a day (BID) | ORAL | 0 refills | Status: DC

## 2020-12-20 MED ORDER — LEVOFLOXACIN 750 MG PO TABS: 750.0000 mg | ORAL_TABLET | ORAL | 0 refills | Status: DC

## 2020-12-20 MED ORDER — DOXYCYCLINE HYCLATE 100 MG PO TABS: 100.0000 mg | ORAL_TABLET | Freq: Two times a day (BID) | ORAL | 0 refills | Status: DC

## 2020-12-20 NOTE — Progress Notes (Addendum)
[facility]                                                             [place], [place], [place], [address]                                                                  Phn. [phone]; Fax: [phone]                                                                             Date: 12/20/20  Reason for Referral: wounds in bilateral lower extremities  Requesting  Provider: Sherrie Mustache  Assessment Problem List Items Addressed This Visit       Endocrine   Diabetic ulcer of toe of right foot associated with type 2 diabetes mellitus, with necrosis of muscle ([place])     Musculoskeletal and Integument   Osteomyelitis ([place]) - Primary   Relevant Orders   CBC (Completed)   Basic metabolic panel (Completed)   C-reactive protein (Completed)   Sedimentation rate (Completed)     Other   Medication monitoring encounter    RT TMA site Ulcer: deep wound cx 9/12 growing Amp S E faecalis. Wound cx 6/22 rt foot growing Pseudomonas aeruginosa.   Left Foot 1st MTP medial ulcer Possible Osteomyelitis  DM PAD s/po interventions as below CAD s/p CHF  Plan Continue PO Doxycycline as is Will add Augmentin and levofloxacin for MRSA and PsA coverage as she has grown PsA from her rt foot in 09/2020. I will follow up the MRI of both  feet ordered by Dr Rebekah [place] office Labs today CBC, BMP, ESR and CRP  Follow up in 10 -14 days once MRI is done for planning abtx. Per notes from Dr Ellard Artis, patient is refusing proximal amputation of bilateral feet.  Follow up with Vascular as instructed  Orders Placed This Encounter  Procedures   CBC   Basic metabolic panel   C-reactive protein   Sedimentation rate     All questions and concerns were discussed and addressed. Patient verbalized understanding of the  plan. ____________________________________________________________________________________________________________________  HPI: 87 year old female with PMH of Pyloric stenosis s/p G tube, CAD status post chronic diastolic CHF, CKD, COPD, hyperlipidemia, hypertension, hypothyroidism, CKD, DMCVA, PAD status post left second and third toe amputation 09/2016, left balloon angioplasty June 2018, right vascular balloon angioplasty March 2019, S/P left popliteal angioplasty followed by CFA repair due to hematoma on 10/20/20, s/p  left second and third toe amputation 09/2016, right TMA August 2021 was referred from podiatry given concern for osteomyelitis of the right TMA site and left foot.   Patient is accompanied by her daughter. Patient seems to be awake, alert and oriented but a poor historian. Daughter is with patient  who tells me that her mother had wound in the rt TMA site since the TMA in August 2021 and has failed to heal. It drains on and off. The wound at the left medial great toes has been there for at least 3-4 months and it started as a bunion. She was last seen by Dr Amalia Hailey Podiatrist on 9/12 where there was concern for osteomyelitis. MRI of bilateral feet was ordered ( pending).  Cultures taken from the rt TMA site which is growing AmpS E faecalis. She was also prescribed doxycyline which she has been taking without missing any doses. Vascular US 11/16/20 with abnormal ABI bilaterally. Daughter tells me she has also been following with Vascular surgery.   ROS: Constitutional: Negative for fever, chills, activity change, appetite change, fatigue and unexpected weight change.  Respiratory: Negative for cough, shortness of breath Cardiovascular: Negative for chest pain, palpitations Gastrointestinal: Negative for nausea, vomiting, abdominal pain, diarrhea/constipation, .  Genitourinary: Negative for dysuria, hematuria, flank pain Musculoskeletal: Negative for myalgias, arthralgia, back pain, joint  swelling, arthralgias Skin: Negative for rashes, lesions  Neurological: Negative for weakness, dizziness or headache  Past Medical History:  Diagnosis Date   Acute respiratory failure ([place])    Anemia    previous blood transfusions   Arthritis    "all over"   Asthma    Atrial flutter (HCC)    Bradycardia    requiring previous d/c of BB and reduction of amiodarone   CAD (coronary artery disease)    nonobstructive per notes   Chronic diastolic CHF (congestive heart failure) ([place])    CKD (chronic kidney disease), stage III ([place])    Complication of blood transfusion    "got the wrong blood type at [facility] in ~ 2015; no adverse reaction that we are aware of"/daughter, [name] (01/27/2016)   COPD (chronic obstructive pulmonary disease) ([place])    Depression    "light case"   DVT (deep venous thrombosis) ([place]) 01/2016   a. LLE DVT 01/2016 - switched from Eliquis to Coumadin.   Dyspnea    with some activity   Gastric stenosis    a. s/p stomach tube   GERD (gastroesophageal reflux disease)    Headache    History of 2019 novel coronavirus disease (COVID-19)    History of blood transfusion    "several" (01/27/2016)   History of stomach ulcers    Hyperlipidemia    Hypertension    Hypothyroidism    On home oxygen therapy    "2L; 9PM til 9AM" (06/27/2017)   PAD (peripheral artery disease) (HCC)    a. prior gangrene, toe amputations, intervention.   PAF (paroxysmal atrial fibrillation) ([place])    Paraesophageal hernia    Perforated gastric ulcer ([place])    Pneumonia    "a few times" (06/27/2017)   Seasonal allergies    SIADH (syndrome of inappropriate ADH production) ([place])    [name] 01/10/2015   Small bowel obstruction ([place])    "I don't know how many" (01/11/2015)   Stroke ([place])    several- no residual   Type II diabetes mellitus ([place])    "related to prednisone use  for > 20 yr; once predinose stopped; no more DM RX" (01/27/2016)   UTI (urinary tract infection) 02/08/2016   Ventral  hernia with bowel obstruction    Past Surgical History:  Procedure Laterality Date   ABDOMINAL AORTOGRAM N/A 09/21/2016   Procedure: Abdominal Aortogram;  Surgeon: Waynetta Sandy, MD;  Location: [facility];  Service:  Cardiovascular;  Laterality: N/A;   ABDOMINAL AORTOGRAM W/LOWER EXTREMITY N/A 10/20/2020   Procedure: ABDOMINAL AORTOGRAM W/LOWER EXTREMITY;  Surgeon: Cherre Robins, MD;  Location: [facility];  Service: Cardiovascular;  Laterality: N/A;   AMPUTATION Left 09/25/2016   Procedure: AMPUTATION DIGIT- LEFT 2ND AND 3RD TOES;  Surgeon: Rosetta Posner, MD;  Location: [facility];  Service: Vascular;  Laterality: Left;   AMPUTATION Right 05/03/2018   Procedure: AMPUTATION RIGHT GREAT TOE;  Surgeon: Rosetta Posner, MD;  [facility];  Service: Vascular;  Laterality: Right;   CATARACT EXTRACTION W/ INTRAOCULAR LENS  IMPLANT, BILATERAL     CHOLECYSTECTOMY OPEN     COLECTOMY     ESOPHAGOGASTRODUODENOSCOPY N/A 01/19/2014   Procedure: ESOPHAGOGASTRODUODENOSCOPY (EGD);  Surgeon: Irene Shipper, MD;  Location: [facility];  Service: Endoscopy;  Laterality: N/A;   ESOPHAGOGASTRODUODENOSCOPY N/A 01/20/2014   Procedure: ESOPHAGOGASTRODUODENOSCOPY (EGD);  Surgeon: Irene Shipper, MD;  Location: [facility];  Service: Endoscopy;  Laterality: N/A;   ESOPHAGOGASTRODUODENOSCOPY N/A 03/19/2014   Procedure: ESOPHAGOGASTRODUODENOSCOPY (EGD);  Surgeon: Milus Banister, MD;  Location: [facility];  Service: Endoscopy;  Laterality: N/A;   ESOPHAGOGASTRODUODENOSCOPY N/A 07/08/2015   Procedure: ESOPHAGOGASTRODUODENOSCOPY (EGD);  Surgeon: Doran Stabler, MD;  Location: [facility];  Service: Endoscopy;  Laterality: N/A;   ESOPHAGOGASTRODUODENOSCOPY (EGD) WITH PROPOFOL N/A 09/15/2015   Procedure: ESOPHAGOGASTRODUODENOSCOPY (EGD) WITH PROPOFOL;  Surgeon: Manus Gunning, MD;  [facility];  Service: Gastroenterology;  Laterality: N/A;   ESOPHAGOGASTRODUODENOSCOPY (EGD)  WITH PROPOFOL N/A 10/12/2020   Procedure: ESOPHAGOGASTRODUODENOSCOPY (EGD) WITH PROPOFOL;  Surgeon: Gatha Mayer, MD;  Location: [facility];  Service: Endoscopy;  Laterality: N/A;   GASTROJEJUNOSTOMY     hx/notes 01/10/2015   GASTROJEJUNOSTOMY N/A 09/23/2015   Procedure: OPEN GASTROJEJUNOSTOMY TUBE PLACEMENT;  Surgeon: Arta Bruce Kinsinger, MD;  Location[facility];  Service: General;  Laterality: N/A;   GLAUCOMA SURGERY Bilateral    HEMATOMA EVACUATION Right 10/20/2020   Procedure: EVACUATION HEMATOMA RIGHT GROIN;  Surgeon: Cherre Robins, MD;  Location: [facility];  Service: Vascular;  Laterality: Right;   IR CM INJ ANY COLONIC TUBE W/FLUORO  01/05/2017   IR CM INJ ANY COLONIC TUBE W/FLUORO  06/07/2017   IR CM INJ ANY COLONIC TUBE W/FLUORO  11/05/2017   IR CM INJ ANY COLONIC TUBE W/FLUORO  09/18/2018   IR CM INJ ANY COLONIC TUBE W/FLUORO  03/06/2019   IR CM INJ ANY COLONIC TUBE W/FLUORO  03/11/2019   IR GASTR TUBE CONVERT GASTR-JEJ PER W/FL MOD SED  04/30/2020   IR GENERIC HISTORICAL  01/07/2016   IR [place] TUBE CHANGE 01/07/2016 Jacqulynn Cadet, MD [facility]   IR GENERIC HISTORICAL  01/27/2016   IR MECH REMOV OBSTRUC MAT ANY COLON TUBE W/FLUORO 01/27/2016 Markus Daft, MD [facility]   IR GENERIC HISTORICAL  02/07/2016   IR PATIENT EVAL TECH 0-60 MINS Darrell K Allred, PA-C [facility]   IR GENERIC HISTORICAL  02/08/2016   IR [place] TUBE CHANGE 02/08/2016 Greggory Keen, MD [facility]   IR GENERIC HISTORICAL  01/06/2016   IR [place] TUBE CHANGE 01/06/2016 [facility] OUT REF   IR GENERIC HISTORICAL  05/02/2016   IR CM INJ ANY COLONIC TUBE W/FLUORO 05/02/2016 Arne Cleveland, MD [facility]   IR GENERIC HISTORICAL  05/15/2016   IR [place] TUBE CHANGE 05/15/2016 Sandi Mariscal, MD [facility]   IR GENERIC HISTORICAL  06/28/2016   IR [place] TUBE CHANGE 06/28/2016 [facility]   IR [facility] TUBE  CHANGE  02/20/2017   IR GJ TUBE CHANGE  05/10/2017   IR GJ TUBE CHANGE  07/06/2017   IR GJ TUBE CHANGE  08/02/2017   IR  GJ TUBE CHANGE  10/15/2017   IR GJ TUBE CHANGE  12/26/2017   IR GJ TUBE CHANGE  04/08/2018   IR GJ TUBE CHANGE  06/19/2018   IR GJ TUBE CHANGE  03/03/2019   IR GJ TUBE CHANGE  06/06/2019   IR GJ TUBE CHANGE  09/26/2019   IR GJ TUBE CHANGE  01/09/2020   IR GJ TUBE CHANGE  05/12/2020   IR GJ TUBE CHANGE  10/05/2020   IR GJ TUBE CHANGE  10/14/2020   IR GJ TUBE CHANGE  10/19/2020   IR PATIENT EVAL TECH 0-60 MINS  10/19/2016   IR PATIENT EVAL TECH 0-60 MINS  12/25/2016   IR PATIENT EVAL TECH 0-60 MINS  01/29/2017   IR PATIENT EVAL TECH 0-60 MINS  04/04/2017   IR PATIENT EVAL TECH 0-60 MINS  04/30/2017   IR PATIENT EVAL TECH 0-60 MINS  07/31/2017   IR REPLC DUODEN/JEJUNO TUBE PERCUT W/FLUORO  11/14/2016   LAPAROTOMY N/A 01/20/2015   Procedure: EXPLORATORY LAPAROTOMY;  Surgeon: Coralie Keens, MD;  Location: [facility];  Service: General;  Laterality: N/A;   LOWER EXTREMITY ANGIOGRAPHY Left 09/21/2016   Procedure: Lower Extremity Angiography;  Surgeon: Waynetta Sandy, MD;  Location: [facility];  Service: Cardiovascular;  Laterality: Left;   LOWER EXTREMITY ANGIOGRAPHY Right 06/27/2017   Procedure: Lower Extremity Angiography;  Surgeon: Waynetta Sandy, MD;  Location: [facility];  Service: Cardiovascular;  Laterality: Right;   LYSIS OF ADHESION N/A 01/20/2015   Procedure: LYSIS OF ADHESIONS < 1 HOUR;  Surgeon: Coralie Keens, MD;  Location: [facility];  Service: General;  Laterality: N/A;   PERIPHERAL VASCULAR BALLOON ANGIOPLASTY Left 09/21/2016   Procedure: Peripheral Vascular Balloon Angioplasty;  Surgeon: Waynetta Sandy, MD;  Location: [facility];  Service: Cardiovascular;  Laterality: Left;  drug coated balloon   PERIPHERAL VASCULAR BALLOON ANGIOPLASTY Right 06/27/2017   Procedure: PERIPHERAL VASCULAR BALLOON ANGIOPLASTY;  Surgeon: Waynetta Sandy, MD;  Location: [facility];  Service: Cardiovascular;  Laterality: Right;   SFA/TPTRUNK   PERIPHERAL VASCULAR INTERVENTION Left 10/20/2020   Procedure: PERIPHERAL VASCULAR INTERVENTION;  Surgeon: Cherre Robins, MD;  Location: [facility];  Service: Cardiovascular;  Laterality: Left;   TONSILLECTOMY     TRANSMETATARSAL AMPUTATION Right 11/14/2019   Procedure: TRANSMETATARSAL AMPUTATION RIGHT FOOT;  Surgeon: Edrick Kins, DPM;  Location: [facility];  Service: Podiatry;  Laterality: Right;   TUBAL LIGATION     VENTRAL HERNIA REPAIR  2015   incarcerated ventral hernia ([facility] 09/2013)/notes 01/10/2015   WOUND EXPLORATION Right 10/20/2020   Procedure: PRIMARY CLOSURE COMMON FEMORAL ARTERY;  Surgeon: Cherre Robins, MD;  Location: [facility];  Service: Vascular;  Laterality: Right;   Current Outpatient Medications on File Prior to Visit  Medication Sig Dispense Refill   acetaminophen (TYLENOL) 500 MG tablet Take 500 mg by mouth every 6 (six) hours as needed for moderate pain.     amLODipine (NORVASC) 5 MG tablet TAKE 1 TABLET BY MOUTH EVERY DAY 90 tablet 1   aspirin 81 MG EC tablet Take 1 tablet (81 mg total) by mouth daily. Swallow whole. 150 tablet 2   atorvastatin (LIPITOR) 10 MG tablet TAKE 1 TABLET BY MOUTH EVERY DAY 90 tablet 1   b complex vitamins tablet  Take 1 tablet by mouth daily.      cholecalciferol (VITAMIN D3) 25 MCG (1000 UNIT) tablet Take 1,000 Units by mouth daily.     clopidogrel (PLAVIX) 75 MG tablet Take 1 tablet (75 mg total) by mouth daily. 90 tablet 3   cycloSPORINE (RESTASIS) 0.05 % ophthalmic emulsion USE 1 DROP INTO BOTH EYES TWICE DAILY 60 mL 3   doxycycline (VIBRA-TABS) 100 MG tablet Take 1 tablet (100 mg total) by mouth 2 (two) times daily. 28 tablet 1   DULoxetine (CYMBALTA) 30 MG capsule TAKE 1 CAPSULE BY MOUTH EVERY DAY 90 capsule 2   ferrous sulfate 220 (44 Fe) MG/5ML solution TAKE 7.4 MLS (325 MG TOTAL) BY MOUTH DAILY. 150 mL 11   fluticasone (FLONASE) 50 MCG/ACT nasal spray 2 sprays into each nostril daily. 48 mL 2   furosemide (LASIX) 40  MG tablet TAKE 0.5-1 TABLETS (20-40 MG TOTAL) BY MOUTH SEE ADMIN INSTRUCTIONS. TAKE 20 MG DAILY, MAY INSTEAD TAKE 40 MG DAILY AS NEEDED FOR EDEMA 30 tablet 8   gabapentin (NEURONTIN) 300 MG capsule TAKE 1 CAPSULE BY MOUTH TWICE A DAY 180 capsule 1   gentamicin cream (GARAMYCIN) 0.1 % Apply 1 application topically daily as needed (dry skin). 30 g 1   hydrALAZINE (APRESOLINE) 50 MG tablet TAKE 1 TABLET BY MOUTH THREE TIMES A DAY 270 tablet 2   Insulin Pen Needle (B-D UF III MINI PEN NEEDLES) 31G X 5 MM MISC Use twice daily with giving insulin injections. Dx E11.9 300 each 3   ipratropium-albuterol (DUONEB) 0.5-2.5 (3) MG/3ML SOLN Take 3 mLs by nebulization every 6 (six) hours as needed (shortness of breath).     isosorbide mononitrate (IMDUR) 30 MG 24 hr tablet TAKE 2 TABLETS BY MOUTH DAILY. 180 tablet 2   latanoprost (XALATAN) 0.005 % ophthalmic solution Place 1 drop into both eyes at bedtime.     levalbuterol (XOPENEX HFA) 45 MCG/ACT inhaler Inhale 2 puffs into the lungs every 6 (six) hours as needed for wheezing.     levothyroxine (SYNTHROID) 125 MCG tablet TAKE 1 TABLET BY MOUTH EVERY DAY 90 tablet 0   loratadine (CLARITIN) 10 MG tablet Take 10 mg by mouth daily.     mometasone-formoterol (DULERA) 200-5 MCG/ACT AERO INHALE TWO PUFFS BY MOUTH TWICE DAILY 13 each 5   Multiple Vitamin (MULTIVITAMIN WITH MINERALS) TABS tablet Take 1 tablet by mouth daily.     Nutritional Supplements (FEEDING SUPPLEMENT, OSMOLITE 1.5 CAL,) LIQD Place 711 mLs into feeding tube See admin instructions. Take 711 ml over a 12 hour period per day 9 am-9 pm at a rate of 65     nystatin (MYCOSTATIN) 100000 UNIT/ML suspension Take 5 mLs (500,000 Units total) by mouth 4 (four) times daily. 473 mL 0   nystatin (MYCOSTATIN/NYSTOP) powder Apply 1 application topically as needed (skin irritation). 15 g 0   nystatin cream (MYCOSTATIN) APPLY TO AFFECTED AREA TWICE A DAY 30 g 11   oxyCODONE (ROXICODONE) 5 MG immediate release tablet  Take 0.5-1 tablets (2.5-5 mg total) by mouth every 8 (eight) hours as needed for severe pain. 30 tablet 0   pantoprazole (PROTONIX) 40 MG tablet Take 1 tablet (40 mg total) by mouth 2 (two) times daily. 180 tablet 2   Polyethyl Glycol-Propyl Glycol (SYSTANE) 0.4-0.3 % GEL ophthalmic gel Place 1 application into both eyes 2 (two) times daily as needed (moisture).     Probiotic Product (EQL PROBIOTIC COLON SUPPORT PO) Take 1 capsule by mouth daily.  senna-docusate (SENOKOT-S) 8.6-50 MG tablet Take 1 tablet by mouth as needed for mild constipation.     shark liver oil-cocoa butter (PREPARATION H) 0.25-3-85.5 % suppository Place 1 suppository rectally as needed for hemorrhoids.     traMADol (ULTRAM) 50 MG tablet Place 1 tablet (50 mg total) into feeding tube every 6 (six) hours as needed for severe pain. 30 tablet 0   Water For Irrigation, Sterile (FREE WATER) SOLN Place 180 mLs into feeding tube 3 (three) times daily.     White Petrolatum-Mineral Oil (SYSTANE NIGHTTIME) OINT Place 1 application into both eyes at bedtime.      No current facility-administered medications on file prior to visit.   Allergies  Allergen Reactions   Penicillins Itching, Rash and Other (See Comments)    Tolerated amoxicillin, unasyn, zosyn & cephalosporins in the past. Did it involve swelling of the face/tongue/throat, SOB, or low BP? No Did it involve sudden or severe rash/hives, skin peeling, or any reaction on the inside of your mouth or nose? No Did you need to seek medical attention at a hospital or doctor's office? Unknown When did it last happen?      5+ years If all above answers are "NO", may proceed with cephalosporin use.    Social History   Socioeconomic History   Marital status: Widowed    Spouse name: Not on file   Number of children: 3   Years of education: Not on file   Highest education level: Not on file  Occupational History   Not on file  Tobacco Use   Smoking status: Never   Smokeless  tobacco: Former    Types: Snuff    Quit date: 04/03/1980   Tobacco comments:    "used snuff in my younger days"  Vaping Use   Vaping Use: Never used  Substance and Sexual Activity   Alcohol use: Never    Alcohol/week: 0.0 standard drinks   Drug use: Never   Sexual activity: Not Currently  Other Topics Concern   Not on file  Social History Narrative   Married in 1947, lives in one story house with 2 people and no petsOccupation: ?Has a living Will, [place], and doesn't have DNRWas living with Husband (in frail health) in [place] until 09/2013.  After her [facility] admission they both came to live with daughter in [place] Strain: Not on file  Food Insecurity: Not on file  Transportation Needs: Not on file  Physical Activity: Not on file  Stress: Not on file  Social Connections: Not on file  Intimate Partner Violence: Not on file   Family History  Problem Relation Age of Onset   Stroke Mother    Hypertension Mother    Diabetes Brother    Glaucoma Son    Glaucoma Son    Heart attack Neg Hx    Colon cancer Neg Hx    Stomach cancer Neg Hx      Vitals BP (!) 166/56   Pulse 85   Temp 98.1 F (36.7 C) (Oral)   Wt 128 lb (58.1 kg)   SpO2 96%   BMI 20.66 kg/m     Examination  General - not in acute distress, comfortably sitting in chair, hard of hearing  HEENT - no pallor and no icterus Chest - b/l clear air entry, no additional sounds CVS- Normal G8Q7, systolic murmur  Abdomen - Soft, Non tender , non distended, G  tube + , site looks OK  Ext-     Neuro: grossly normal Back - WNL Psych : calm and cooperative   Recent labs CBC Latest Ref Rng & Units 11/04/2020 10/22/2020 10/21/2020  WBC 3.8 - 10.8 Thousand/uL 5.4 6.4 5.8  Hemoglobin 11.7 - 15.5 g/dL 9.0(L) 8.8(L) 6.8(LL)  Hematocrit 35.0 - 45.0 % 28.1(L) 26.3(L) 20.2(L)  Platelets 140 - 400 Thousand/uL 313 182 183   CMP Latest Ref Rng & Units  11/04/2020 10/21/2020 10/20/2020  Glucose 65 - 99 mg/dL 74 146(H) 128(H)  BUN 7 - 25 mg/dL 61(H) 39(H) 44(H)  Creatinine 0.60 - 0.95 mg/dL 1.61(H) 1.45(H) 1.47(H)  Sodium 135 - 146 mmol/L 135 134(L) 136  Potassium 3.5 - 5.3 mmol/L 4.9 4.2 4.2  Chloride 98 - 110 mmol/L 101 102 104  CO2 20 - 32 mmol/L _0 Calcium 8.6 - 10.4 mg/dL 9.5 8.5(L) 8.7(L)  Total Protein 6.1 - 8.1 g/dL 7.5 - -  Total Bilirubin 0.2 - 1.2 mg/dL 0.6 - -  Alkaline Phos 38 - 126 U/L - - -  AST 10 - 35 U/L 19 - -  ALT 6 - 29 U/L 11 - -     Pertinent Microbiology Results for orders placed or performed in visit on 12/13/20  WOUND CULTURE     Status: Abnormal   Collection Time: 12/13/20  3:00 PM  Result Value Ref Range Status   MICRO NUMBER: [id number]  Final   SPECIMEN QUALITY: Adequate  Final   SOURCE: RT FOOT ULCER  Final   STATUS: FINAL  Final   GRAM STAIN:   Final    No epithelial cells seen No white blood cells seen Many Gram negative bacilli   ISOLATE 1: Enterococcus faecalis (A)  Final    Comment: Heavy growth of Enterococcus faecalis      Susceptibility   Enterococcus faecalis - AEROBIC CULT, GRAM STAIN POSITIVE 1    AMPICILLIN <=2 Sensitive     VANCOMYCIN* 1 Sensitive      * Legend: S = Susceptible  I = Intermediate R = Resistant  NS = Not susceptible * = Not tested  NR = Not reported **NN = See antimicrobic comments    *Note: Due to a large number of results and/or encounters for the requested time period, some results have not been displayed. A complete set of results can be found in Results Review.      Pertinent Imaging Vascular US 11/16/20 Summary: Right: Resting right ankle-brachial index is within normal range. No evidence of significant right lower extremity arterial disease. The right toe-brachial index is abnormal. Abnormal waveforms could indicate underlying disease. Left: Resting left ankle-brachial index indicates noncompressible left lower extremity arteries. The  left toe-brachial index is abnormal.  All pertinent labs/Imagings/notes reviewed. All pertinent plain films and CT images have been personally visualized and interpreted; radiology reports have been reviewed. Decision making incorporated into the Impression / Recommendations.  I have spent a total of 60 minutes of face-to-face and non-face-to-face time, excluding clinical staff time, preparing to see patient, ordering tests and/or medications, and provide counseling the patient    Electronically signed by:  Rosiland Oz, MD Infectious Disease Physician [facility] [address] [place], [place] [address] Phone: [phone]  Fax: [phone]

## 2020-12-21 ENCOUNTER — Encounter (HOSPITAL_COMMUNITY): Payer: Medicare Other

## 2020-12-21 ENCOUNTER — Inpatient Hospital Stay (HOSPITAL_COMMUNITY)
Admission: EM | Admit: 2020-12-21 | Discharge: 2020-12-31 | DRG: 981 | Disposition: A | Discharge status: Home Health | Payer: Medicare Other | Attending: Internal Medicine | Admitting: Internal Medicine

## 2020-12-21 ENCOUNTER — Emergency Department (HOSPITAL_COMMUNITY): Payer: Medicare Other

## 2020-12-21 ENCOUNTER — Ambulatory Visit: Payer: Medicare Other

## 2020-12-21 DIAGNOSIS — M86171 Other acute osteomyelitis, right ankle and foot: Secondary | ICD-10-CM | POA: Diagnosis present

## 2020-12-21 DIAGNOSIS — Z89422 Acquired absence of other left toe(s): Secondary | ICD-10-CM

## 2020-12-21 DIAGNOSIS — E11621 Type 2 diabetes mellitus with foot ulcer: Secondary | ICD-10-CM | POA: Diagnosis not present

## 2020-12-21 DIAGNOSIS — Z9862 Peripheral vascular angioplasty status: Secondary | ICD-10-CM | POA: Diagnosis not present

## 2020-12-21 DIAGNOSIS — Z8249 Family history of ischemic heart disease and other diseases of the circulatory system: Secondary | ICD-10-CM

## 2020-12-21 DIAGNOSIS — G9340 Encephalopathy, unspecified: Secondary | ICD-10-CM | POA: Diagnosis not present

## 2020-12-21 DIAGNOSIS — R059 Cough, unspecified: Secondary | ICD-10-CM

## 2020-12-21 DIAGNOSIS — M86172 Other acute osteomyelitis, left ankle and foot: Secondary | ICD-10-CM | POA: Diagnosis not present

## 2020-12-21 DIAGNOSIS — M7989 Other specified soft tissue disorders: Secondary | ICD-10-CM | POA: Diagnosis not present

## 2020-12-21 DIAGNOSIS — R519 Headache, unspecified: Secondary | ICD-10-CM | POA: Diagnosis not present

## 2020-12-21 DIAGNOSIS — Z8673 Personal history of transient ischemic attack (TIA), and cerebral infarction without residual deficits: Secondary | ICD-10-CM | POA: Diagnosis not present

## 2020-12-21 DIAGNOSIS — I70235 Atherosclerosis of native arteries of right leg with ulceration of other part of foot: Secondary | ICD-10-CM | POA: Diagnosis not present

## 2020-12-21 DIAGNOSIS — E11628 Type 2 diabetes mellitus with other skin complications: Secondary | ICD-10-CM | POA: Diagnosis present

## 2020-12-21 DIAGNOSIS — N179 Acute kidney failure, unspecified: Secondary | ICD-10-CM | POA: Diagnosis not present

## 2020-12-21 DIAGNOSIS — E785 Hyperlipidemia, unspecified: Secondary | ICD-10-CM | POA: Diagnosis present

## 2020-12-21 DIAGNOSIS — J449 Chronic obstructive pulmonary disease, unspecified: Secondary | ICD-10-CM | POA: Diagnosis present

## 2020-12-21 DIAGNOSIS — R109 Unspecified abdominal pain: Secondary | ICD-10-CM | POA: Diagnosis not present

## 2020-12-21 DIAGNOSIS — L97509 Non-pressure chronic ulcer of other part of unspecified foot with unspecified severity: Secondary | ICD-10-CM | POA: Diagnosis not present

## 2020-12-21 DIAGNOSIS — I10 Essential (primary) hypertension: Secondary | ICD-10-CM | POA: Diagnosis present

## 2020-12-21 DIAGNOSIS — G9341 Metabolic encephalopathy: Secondary | ICD-10-CM | POA: Diagnosis not present

## 2020-12-21 DIAGNOSIS — K219 Gastro-esophageal reflux disease without esophagitis: Secondary | ICD-10-CM | POA: Diagnosis present

## 2020-12-21 DIAGNOSIS — Z79890 Hormone replacement therapy (postmenopausal): Secondary | ICD-10-CM

## 2020-12-21 DIAGNOSIS — L97529 Non-pressure chronic ulcer of other part of left foot with unspecified severity: Secondary | ICD-10-CM | POA: Diagnosis not present

## 2020-12-21 DIAGNOSIS — F32A Depression, unspecified: Secondary | ICD-10-CM | POA: Diagnosis present

## 2020-12-21 DIAGNOSIS — Z833 Family history of diabetes mellitus: Secondary | ICD-10-CM

## 2020-12-21 DIAGNOSIS — Z86718 Personal history of other venous thrombosis and embolism: Secondary | ICD-10-CM | POA: Diagnosis not present

## 2020-12-21 DIAGNOSIS — N184 Chronic kidney disease, stage 4 (severe): Secondary | ICD-10-CM | POA: Diagnosis not present

## 2020-12-21 DIAGNOSIS — Z794 Long term (current) use of insulin: Secondary | ICD-10-CM | POA: Diagnosis not present

## 2020-12-21 DIAGNOSIS — R531 Weakness: Secondary | ICD-10-CM | POA: Diagnosis not present

## 2020-12-21 DIAGNOSIS — G9389 Other specified disorders of brain: Secondary | ICD-10-CM | POA: Diagnosis not present

## 2020-12-21 DIAGNOSIS — N183 Chronic kidney disease, stage 3 unspecified: Secondary | ICD-10-CM | POA: Diagnosis not present

## 2020-12-21 DIAGNOSIS — E039 Hypothyroidism, unspecified: Secondary | ICD-10-CM | POA: Diagnosis present

## 2020-12-21 DIAGNOSIS — I5032 Chronic diastolic (congestive) heart failure: Secondary | ICD-10-CM | POA: Diagnosis present

## 2020-12-21 DIAGNOSIS — Z20822 Contact with and (suspected) exposure to covid-19: Secondary | ICD-10-CM | POA: Diagnosis not present

## 2020-12-21 DIAGNOSIS — L97519 Non-pressure chronic ulcer of other part of right foot with unspecified severity: Secondary | ICD-10-CM | POA: Diagnosis not present

## 2020-12-21 DIAGNOSIS — I2510 Atherosclerotic heart disease of native coronary artery without angina pectoris: Secondary | ICD-10-CM | POA: Diagnosis present

## 2020-12-21 DIAGNOSIS — I110 Hypertensive heart disease with heart failure: Secondary | ICD-10-CM | POA: Diagnosis not present

## 2020-12-21 DIAGNOSIS — I130 Hypertensive heart and chronic kidney disease with heart failure and stage 1 through stage 4 chronic kidney disease, or unspecified chronic kidney disease: Secondary | ICD-10-CM | POA: Diagnosis not present

## 2020-12-21 DIAGNOSIS — Z79899 Other long term (current) drug therapy: Secondary | ICD-10-CM

## 2020-12-21 DIAGNOSIS — D320 Benign neoplasm of cerebral meninges: Secondary | ICD-10-CM | POA: Diagnosis present

## 2020-12-21 DIAGNOSIS — M86671 Other chronic osteomyelitis, right ankle and foot: Secondary | ICD-10-CM | POA: Diagnosis not present

## 2020-12-21 DIAGNOSIS — Z66 Do not resuscitate: Secondary | ICD-10-CM | POA: Diagnosis present

## 2020-12-21 DIAGNOSIS — M2012 Hallux valgus (acquired), left foot: Secondary | ICD-10-CM | POA: Diagnosis present

## 2020-12-21 DIAGNOSIS — Z9842 Cataract extraction status, left eye: Secondary | ICD-10-CM

## 2020-12-21 DIAGNOSIS — E1122 Type 2 diabetes mellitus with diabetic chronic kidney disease: Secondary | ICD-10-CM | POA: Diagnosis present

## 2020-12-21 DIAGNOSIS — Z7982 Long term (current) use of aspirin: Secondary | ICD-10-CM

## 2020-12-21 DIAGNOSIS — Z8616 Personal history of COVID-19: Secondary | ICD-10-CM | POA: Diagnosis not present

## 2020-12-21 DIAGNOSIS — L089 Local infection of the skin and subcutaneous tissue, unspecified: Secondary | ICD-10-CM | POA: Diagnosis present

## 2020-12-21 DIAGNOSIS — Z7902 Long term (current) use of antithrombotics/antiplatelets: Secondary | ICD-10-CM

## 2020-12-21 DIAGNOSIS — E1169 Type 2 diabetes mellitus with other specified complication: Principal | ICD-10-CM | POA: Diagnosis present

## 2020-12-21 DIAGNOSIS — G8929 Other chronic pain: Secondary | ICD-10-CM | POA: Diagnosis not present

## 2020-12-21 DIAGNOSIS — Z89411 Acquired absence of right great toe: Secondary | ICD-10-CM | POA: Diagnosis not present

## 2020-12-21 DIAGNOSIS — M868X7 Other osteomyelitis, ankle and foot: Secondary | ICD-10-CM

## 2020-12-21 DIAGNOSIS — Z823 Family history of stroke: Secondary | ICD-10-CM

## 2020-12-21 DIAGNOSIS — G319 Degenerative disease of nervous system, unspecified: Secondary | ICD-10-CM | POA: Diagnosis not present

## 2020-12-21 DIAGNOSIS — R600 Localized edema: Secondary | ICD-10-CM | POA: Diagnosis not present

## 2020-12-21 DIAGNOSIS — Z5181 Encounter for therapeutic drug level monitoring: Secondary | ICD-10-CM | POA: Insufficient documentation

## 2020-12-21 DIAGNOSIS — M869 Osteomyelitis, unspecified: Secondary | ICD-10-CM | POA: Insufficient documentation

## 2020-12-21 DIAGNOSIS — Z9841 Cataract extraction status, right eye: Secondary | ICD-10-CM

## 2020-12-21 DIAGNOSIS — Z961 Presence of intraocular lens: Secondary | ICD-10-CM | POA: Diagnosis present

## 2020-12-21 DIAGNOSIS — I70245 Atherosclerosis of native arteries of left leg with ulceration of other part of foot: Secondary | ICD-10-CM | POA: Diagnosis not present

## 2020-12-21 DIAGNOSIS — M86672 Other chronic osteomyelitis, left ankle and foot: Secondary | ICD-10-CM | POA: Diagnosis not present

## 2020-12-21 DIAGNOSIS — Z87891 Personal history of nicotine dependence: Secondary | ICD-10-CM

## 2020-12-21 DIAGNOSIS — Z880 Allergy status to penicillin: Secondary | ICD-10-CM

## 2020-12-21 DIAGNOSIS — R4182 Altered mental status, unspecified: Secondary | ICD-10-CM | POA: Diagnosis not present

## 2020-12-21 DIAGNOSIS — S92332A Displaced fracture of third metatarsal bone, left foot, initial encounter for closed fracture: Secondary | ICD-10-CM | POA: Diagnosis not present

## 2020-12-21 DIAGNOSIS — I700 Atherosclerosis of aorta: Secondary | ICD-10-CM | POA: Diagnosis not present

## 2020-12-21 LAB — BASIC METABOLIC PANEL WITH GFR
BUN/Creatinine Ratio: 43 (calc) — ABNORMAL HIGH (ref 6–22)
BUN: 73 mg/dL — ABNORMAL HIGH (ref 7–25)
CO2: 27 mmol/L (ref 20–32)
Calcium: 9.9 mg/dL (ref 8.6–10.4)
Chloride: 101 mmol/L (ref 98–110)
Creat: 1.68 mg/dL — ABNORMAL HIGH (ref 0.60–0.95)
Glucose, Bld: 87 mg/dL (ref 65–99)
Potassium: 5.1 mmol/L (ref 3.5–5.3)
Sodium: 137 mmol/L (ref 135–146)

## 2020-12-21 LAB — COMPREHENSIVE METABOLIC PANEL
ALT: 14 U/L (ref 0–44)
AST: 20 U/L (ref 15–41)
Albumin: 3 g/dL — ABNORMAL LOW (ref 3.5–5.0)
Alkaline Phosphatase: 76 U/L (ref 38–126)
Anion gap: 10 (ref 5–15)
BUN: 70 mg/dL — ABNORMAL HIGH (ref 8–23)
CO2: 24 mmol/L (ref 22–32)
Calcium: 9.4 mg/dL (ref 8.9–10.3)
Chloride: 102 mmol/L (ref 98–111)
Creatinine, Ser: 1.65 mg/dL — ABNORMAL HIGH (ref 0.44–1.00)
GFR, Estimated: 28 mL/min — ABNORMAL LOW (ref >60)
Glucose, Bld: 122 mg/dL — ABNORMAL HIGH (ref 70–99)
Potassium: 4.5 mmol/L (ref 3.5–5.1)
Sodium: 136 mmol/L (ref 135–145)
Total Bilirubin: 0.4 mg/dL (ref 0.3–1.2)
Total Protein: 6.9 g/dL (ref 6.5–8.1)

## 2020-12-21 LAB — CBC WITH DIFFERENTIAL/PLATELET
Abs Immature Granulocytes: 0.01 10*3/uL (ref 0.00–0.07)
Basophils Absolute: 0 10*3/uL (ref 0.0–0.1)
Basophils Relative: 1 %
Eosinophils Absolute: 0.2 10*3/uL (ref 0.0–0.5)
Eosinophils Relative: 4 %
HCT: 28 % — ABNORMAL LOW (ref 36.0–46.0)
Hemoglobin: 9.2 g/dL — ABNORMAL LOW (ref 12.0–15.0)
Immature Granulocytes: 0 %
Lymphocytes Relative: 13 %
Lymphs Abs: 0.5 10*3/uL — ABNORMAL LOW (ref 0.7–4.0)
MCH: 31.8 pg (ref 26.0–34.0)
MCHC: 32.9 g/dL (ref 30.0–36.0)
MCV: 96.9 fL (ref 80.0–100.0)
Monocytes Absolute: 1 10*3/uL (ref 0.1–1.0)
Monocytes Relative: 23 %
Neutro Abs: 2.5 10*3/uL (ref 1.7–7.7)
Neutrophils Relative %: 59 %
Platelets: 194 10*3/uL (ref 150–400)
RBC: 2.89 MIL/uL — ABNORMAL LOW (ref 3.87–5.11)
RDW: 17.8 % — ABNORMAL HIGH (ref 11.5–15.5)
WBC: 4.2 10*3/uL (ref 4.0–10.5)
nRBC: 0 % (ref 0.0–0.2)

## 2020-12-21 LAB — CBC
HCT: 28.9 % — ABNORMAL LOW (ref 35.0–45.0)
Hemoglobin: 9.5 g/dL — ABNORMAL LOW (ref 11.7–15.5)
MCH: 32.1 pg (ref 27.0–33.0)
MCHC: 32.9 g/dL (ref 32.0–36.0)
MCV: 97.6 fL (ref 80.0–100.0)
MPV: 11.7 fL (ref 7.5–12.5)
Platelets: 232 10*3/uL (ref 140–400)
RBC: 2.96 10*6/uL — ABNORMAL LOW (ref 3.80–5.10)
RDW: 16.1 % — ABNORMAL HIGH (ref 11.0–15.0)
WBC: 6 10*3/uL (ref 3.8–10.8)

## 2020-12-21 LAB — C-REACTIVE PROTEIN: CRP: 22.6 mg/L — ABNORMAL HIGH

## 2020-12-21 LAB — URINALYSIS, ROUTINE W REFLEX MICROSCOPIC
Bilirubin Urine: NEGATIVE
Glucose, UA: NEGATIVE mg/dL
Hgb urine dipstick: NEGATIVE
Ketones, ur: NEGATIVE mg/dL
Leukocytes,Ua: NEGATIVE
Nitrite: NEGATIVE
Protein, ur: NEGATIVE mg/dL
Specific Gravity, Urine: 1.01 (ref 1.005–1.030)
pH: 7.5 (ref 5.0–8.0)

## 2020-12-21 LAB — RESP PANEL BY RT-PCR (FLU A&B, COVID) ARPGX2
Influenza A by PCR: NEGATIVE
Influenza B by PCR: NEGATIVE
SARS Coronavirus 2 by RT PCR: NEGATIVE

## 2020-12-21 LAB — LACTIC ACID, PLASMA: Lactic Acid, Venous: 0.7 mmol/L (ref 0.5–1.9)

## 2020-12-21 LAB — SEDIMENTATION RATE: Sed Rate: 50 mm/h — ABNORMAL HIGH (ref 0–30)

## 2020-12-21 MED ORDER — ATORVASTATIN CALCIUM 10 MG PO TABS
10.0000 mg | ORAL_TABLET | Freq: Every day | ORAL | Status: DC
  Administered 2020-12-22 – 2020-12-31 (×10): 10 mg via ORAL
  Filled 2020-12-21 (×10): qty 1

## 2020-12-21 MED ORDER — CLOPIDOGREL BISULFATE 75 MG PO TABS
75.0000 mg | ORAL_TABLET | Freq: Every day | ORAL | Status: DC
  Administered 2020-12-22 – 2020-12-31 (×10): 75 mg via ORAL
  Filled 2020-12-21 (×10): qty 1

## 2020-12-21 MED ORDER — GABAPENTIN 300 MG PO CAPS
300.0000 mg | ORAL_CAPSULE | Freq: Two times a day (BID) | ORAL | Status: DC
  Administered 2020-12-22 – 2020-12-28 (×15): 300 mg via ORAL
  Filled 2020-12-21 (×15): qty 1

## 2020-12-21 MED ORDER — CYCLOSPORINE 0.05 % OP EMUL
1.0000 [drp] | Freq: Two times a day (BID) | OPHTHALMIC | Status: DC
  Administered 2020-12-22 – 2020-12-30 (×18): 1 [drp] via OPHTHALMIC
  Filled 2020-12-21 (×23): qty 30

## 2020-12-21 MED ORDER — IPRATROPIUM-ALBUTEROL 0.5-2.5 (3) MG/3ML IN SOLN: 3.0000 mL | Freq: Four times a day (QID) | RESPIRATORY_TRACT | Status: DC | PRN

## 2020-12-21 MED ORDER — ARTIFICIAL TEARS OPHTHALMIC OINT
1.0000 "application " | TOPICAL_OINTMENT | Freq: Two times a day (BID) | OPHTHALMIC | Status: DC | PRN
  Administered 2020-12-27: 1 via OPHTHALMIC

## 2020-12-21 MED ORDER — ONDANSETRON HCL 4 MG/2ML IJ SOLN: 4.0000 mg | Freq: Four times a day (QID) | INTRAMUSCULAR | Status: DC | PRN

## 2020-12-21 MED ORDER — ASPIRIN EC 81 MG PO TBEC
81.0000 mg | DELAYED_RELEASE_TABLET | Freq: Every day | ORAL | Status: DC
  Administered 2020-12-22 – 2020-12-31 (×10): 81 mg via ORAL
  Filled 2020-12-21 (×11): qty 1

## 2020-12-21 MED ORDER — SODIUM CHLORIDE 0.9 % IV SOLN: INTRAVENOUS | Status: DC

## 2020-12-21 MED ORDER — PIPERACILLIN-TAZOBACTAM 3.375 G IVPB
3.3750 g | Freq: Two times a day (BID) | INTRAVENOUS | Status: DC
  Administered 2020-12-21: 3.375 g via INTRAVENOUS
  Filled 2020-12-21: qty 50

## 2020-12-21 MED ORDER — LORATADINE 10 MG PO TABS
10.0000 mg | ORAL_TABLET | Freq: Every day | ORAL | Status: DC
  Administered 2020-12-22 – 2020-12-31 (×10): 10 mg via ORAL
  Filled 2020-12-21 (×10): qty 1

## 2020-12-21 MED ORDER — PANTOPRAZOLE SODIUM 40 MG PO TBEC
40.0000 mg | DELAYED_RELEASE_TABLET | Freq: Two times a day (BID) | ORAL | Status: DC
  Administered 2020-12-22 – 2020-12-31 (×20): 40 mg via ORAL
  Filled 2020-12-21 (×20): qty 1

## 2020-12-21 MED ORDER — MOMETASONE FURO-FORMOTEROL FUM 200-5 MCG/ACT IN AERO
2.0000 | INHALATION_SPRAY | Freq: Two times a day (BID) | RESPIRATORY_TRACT | Status: DC
  Administered 2020-12-22 – 2020-12-31 (×16): 2 via RESPIRATORY_TRACT
  Filled 2020-12-21 (×2): qty 8.8

## 2020-12-21 MED ORDER — ACETAMINOPHEN 650 MG RE SUPP: 650.0000 mg | Freq: Four times a day (QID) | RECTAL | Status: DC | PRN

## 2020-12-21 MED ORDER — AMLODIPINE BESYLATE 5 MG PO TABS
5.0000 mg | ORAL_TABLET | Freq: Every day | ORAL | Status: DC
  Administered 2020-12-22 – 2020-12-31 (×10): 5 mg via ORAL
  Filled 2020-12-21 (×8): qty 1
  Filled 2020-12-21: qty 2
  Filled 2020-12-21 (×2): qty 1

## 2020-12-21 MED ORDER — FERROUS SULFATE 220 (44 FE) MG/5ML PO ELIX
325.0000 mg | ORAL_SOLUTION | Freq: Every day | ORAL | Status: DC
  Administered 2020-12-22 – 2020-12-27 (×6): 325 mg via ORAL
  Filled 2020-12-21 (×8): qty 7.4

## 2020-12-21 MED ORDER — OSMOLITE 1.5 CAL PO LIQD
65.0000 mL/h | ORAL | Status: DC
  Administered 2020-12-22: 65 mL/h
  Filled 2020-12-21 (×3): qty 1000

## 2020-12-21 MED ORDER — LACTATED RINGERS IV SOLN: INTRAVENOUS | Status: DC

## 2020-12-21 MED ORDER — ARTIFICIAL TEARS OPHTHALMIC OINT
1.0000 "application " | TOPICAL_OINTMENT | Freq: Every day | OPHTHALMIC | Status: DC
  Administered 2020-12-23 – 2020-12-30 (×8): 1 via OPHTHALMIC
  Filled 2020-12-21: qty 3.5

## 2020-12-21 MED ORDER — OXYCODONE HCL 5 MG PO TABS
2.5000 mg | ORAL_TABLET | Freq: Three times a day (TID) | ORAL | Status: DC | PRN
  Administered 2020-12-22: 5 mg via ORAL
  Administered 2020-12-29: 2.5 mg via ORAL
  Filled 2020-12-21 (×2): qty 1

## 2020-12-21 MED ORDER — DULOXETINE HCL 30 MG PO CPEP
30.0000 mg | ORAL_CAPSULE | Freq: Every day | ORAL | Status: DC
  Administered 2020-12-22 – 2020-12-31 (×10): 30 mg via ORAL
  Filled 2020-12-21 (×10): qty 1

## 2020-12-21 MED ORDER — LEVOTHYROXINE SODIUM 25 MCG PO TABS
125.0000 ug | ORAL_TABLET | Freq: Every day | ORAL | Status: DC
  Administered 2020-12-23 – 2020-12-31 (×9): 125 ug via ORAL
  Filled 2020-12-21 (×9): qty 1

## 2020-12-21 MED ORDER — ADULT MULTIVITAMIN W/MINERALS CH
1.0000 | ORAL_TABLET | Freq: Every day | ORAL | Status: DC
  Administered 2020-12-23 – 2020-12-31 (×9): 1 via ORAL
  Filled 2020-12-21 (×10): qty 1

## 2020-12-21 MED ORDER — ONDANSETRON HCL 4 MG PO TABS: 4.0000 mg | ORAL_TABLET | Freq: Four times a day (QID) | ORAL | Status: DC | PRN

## 2020-12-21 MED ORDER — FREE WATER
180.0000 mL | Freq: Three times a day (TID) | Status: DC
  Administered 2020-12-22 – 2020-12-31 (×23): 180 mL

## 2020-12-21 MED ORDER — ISOSORBIDE MONONITRATE ER 60 MG PO TB24
60.0000 mg | ORAL_TABLET | Freq: Every day | ORAL | Status: DC
  Administered 2020-12-22 – 2020-12-29 (×8): 60 mg via ORAL
  Filled 2020-12-21: qty 1
  Filled 2020-12-21: qty 2
  Filled 2020-12-21 (×6): qty 1

## 2020-12-21 MED ORDER — NYSTATIN 100000 UNIT/GM EX CREA
1.0000 "application " | TOPICAL_CREAM | Freq: Two times a day (BID) | CUTANEOUS | Status: DC
  Administered 2020-12-27 – 2020-12-30 (×3): 1 via TOPICAL
  Filled 2020-12-21: qty 15

## 2020-12-21 MED ORDER — VANCOMYCIN HCL 1250 MG/250ML IV SOLN
1250.0000 mg | Freq: Once | INTRAVENOUS | Status: DC
  Filled 2020-12-21: qty 250

## 2020-12-21 MED ORDER — FLUTICASONE PROPIONATE 50 MCG/ACT NA SUSP
2.0000 | Freq: Every day | NASAL | Status: DC
  Administered 2020-12-22 – 2020-12-31 (×10): 2 via NASAL
  Filled 2020-12-21: qty 16

## 2020-12-21 MED ORDER — LATANOPROST 0.005 % OP SOLN
1.0000 [drp] | Freq: Every day | OPHTHALMIC | Status: DC
  Administered 2020-12-23 – 2020-12-30 (×8): 1 [drp] via OPHTHALMIC
  Filled 2020-12-21: qty 2.5

## 2020-12-21 MED ORDER — ALBUTEROL SULFATE (2.5 MG/3ML) 0.083% IN NEBU: 2.5000 mg | INHALATION_SOLUTION | Freq: Four times a day (QID) | RESPIRATORY_TRACT | Status: DC | PRN

## 2020-12-21 MED ORDER — ACETAMINOPHEN 325 MG PO TABS: 650.0000 mg | ORAL_TABLET | Freq: Four times a day (QID) | ORAL | Status: DC | PRN

## 2020-12-21 MED ORDER — HEPARIN SODIUM (PORCINE) 5000 UNIT/ML IJ SOLN
5000.0000 [IU] | Freq: Three times a day (TID) | INTRAMUSCULAR | Status: DC
  Administered 2020-12-22 – 2020-12-31 (×21): 5000 [IU] via SUBCUTANEOUS
  Filled 2020-12-21 (×20): qty 1

## 2020-12-21 MED ORDER — SENNOSIDES-DOCUSATE SODIUM 8.6-50 MG PO TABS
1.0000 | ORAL_TABLET | Freq: Every day | ORAL | Status: DC
  Administered 2020-12-22 – 2020-12-30 (×10): 1 via ORAL
  Filled 2020-12-21 (×11): qty 1

## 2020-12-21 MED ORDER — HYDRALAZINE HCL 50 MG PO TABS
50.0000 mg | ORAL_TABLET | Freq: Three times a day (TID) | ORAL | Status: DC
  Administered 2020-12-22 – 2020-12-31 (×29): 50 mg via ORAL
  Filled 2020-12-21 (×4): qty 1
  Filled 2020-12-21: qty 2
  Filled 2020-12-21 (×9): qty 1
  Filled 2020-12-21: qty 2
  Filled 2020-12-21 (×13): qty 1

## 2020-12-21 NOTE — ED Provider Notes (Signed)
[facility] EMERGENCY DEPARTMENT Provider Note   CSN: [id number] Arrival date & time: 12/21/20  0957     History No chief complaint on file.   Shannon [name] is a 87 y.o. female.  87 year old female brought in by daughter with complaint of altered LOC. Normally alert and conversave at baseline, daughter arrived today to flush her feeding tube and patient did not respond. Daughter called 39, while waiting for EMS, patient started to wake up and complained of bilateral foot pain, known foot wounds/ulcers, has pain medication to take for this but does not normally need it. Seen by ID yesterday who ordered foot MRI for concern for osteomyelitis, started on Levaquin and Augmentin (added to the Doxy she was already taking). At this time, back to baseline, unsure why not at baseline this morning. ID ordered the MRIs to for treatment planning.  No fevers.  Patient lives with her daughter, is ambulatory at baseline and provides her own wound care.      Past Medical History:  Diagnosis Date   Acute respiratory failure ([place])    Anemia    previous blood transfusions   Arthritis    "all over"   Asthma    Atrial flutter (HCC)    Bradycardia    requiring previous d/c of BB and reduction of amiodarone   CAD (coronary artery disease)    nonobstructive per notes   Chronic diastolic CHF (congestive heart failure) ([place])    CKD (chronic kidney disease), stage III ([place])    Complication of blood transfusion    "got the wrong blood type at [facility] in ~ 2015; no adverse reaction that we are aware of"/daughter, [name] (01/27/2016)   COPD (chronic obstructive pulmonary disease) ([employer])    Depression    "light case"   DVT (deep venous thrombosis) ([place]) 01/2016   a. LLE DVT 01/2016 - switched from Eliquis to Coumadin.   Dyspnea    with some activity   Gastric stenosis    a. s/p stomach tube   GERD (gastroesophageal reflux disease)    Headache    History of 2019 novel  coronavirus disease (COVID-19)    History of blood transfusion    "several" (01/27/2016)   History of stomach ulcers    Hyperlipidemia    Hypertension    Hypothyroidism    On home oxygen therapy    "2L; 9PM til 9AM" (06/27/2017)   PAD (peripheral artery disease) (HCC)    a. prior gangrene, toe amputations, intervention.   PAF (paroxysmal atrial fibrillation) ([place])    Paraesophageal hernia    Perforated gastric ulcer ([place])    Pneumonia    "a few times" (06/27/2017)   Seasonal allergies    SIADH (syndrome of inappropriate ADH production) ([place])    [name] 01/10/2015   Small bowel obstruction ([place])    "I don't know how many" (01/11/2015)   Stroke ([place])    several- no residual   Type II diabetes mellitus ([place])    "related to prednisone use  for > 20 yr; once predinose stopped; no more DM RX" (01/27/2016)   UTI (urinary tract infection) 02/08/2016   Ventral hernia with bowel obstruction     Patient Active Problem List   Diagnosis Date Noted   Osteomyelitis of right foot ([place]) 12/21/2020   Medication monitoring encounter 12/21/2020   Diabetic infection of right foot ([place]) 12/21/2020   Diabetic infection of left foot ([place]) 12/21/2020   Osteomyelitis of left foot ([place]) 12/21/2020  UGI bleed 10/11/2020   Depression, major, single episode, complete remission ([place]) 06/09/2020   History of transmetatarsal amputation of right foot ([place]) 11/14/2019   Chest pain 08/27/2019   Lab test positive for detection of COVID-19 virus 08/27/2019   Acute encephalopathy 07/07/2019   AMS (altered mental status) 07/07/2019   Varicose veins of right lower extremity with ulcer other part of foot ([place]) 06/11/2019   Jejunostomy tube fell out 03/02/2019   S/P amputation of lesser toe, left (HCC) 02/13/2019   BRBPR (bright red blood per rectum) 01/10/2019   GI bleed 12/25/2018   Bradycardia 11/25/2018   Amputated great toe of right foot ([place]) 05/06/2018   Peripheral vascular disease ([place]) 05/03/2018    Localized, primary osteoarthritis of shoulder region 01/21/2018   PAD (peripheral artery disease) ([place]) 06/27/2017   AKI (acute kidney injury) ([place]) 61/60/7371   Complication of feeding tube ([place]) 02/20/2017   PEG tube malfunction (HCC)    COPD with asthma ([place]) 10/03/2016   Chronic deep vein thrombosis (DVT) of tibial vein of left lower extremity ([place]) 10/03/2016   Gangrene of toe of left foot ([place]) 09/15/2016   Recurrent aspiration pneumonia (HCC)    Atherosclerosis of native arteries of the extremities with gangrene ([place]) 07/28/2016   Other specified hypothyroidism    CHF (NYHA class IV, ACC/AHA stage D) ([place])    Shortness of breath    Pressure injury of skin 03/11/2016   PEG (percutaneous endoscopic gastrostomy) status ([place]) 03/10/2016   Encounter for therapeutic drug monitoring 02/16/2016   Warfarin anticoagulation    Chronic seasonal allergic rhinitis    Atrial fibrillation and flutter ([place]) 01/24/2016   Chronic diastolic CHF (congestive heart failure) ([place]) 01/24/2016   Bilateral leg edema 12/29/2015   Hypothyroidism due to medication    Weakness    Pyloric stenosis    Pressure ulcer 09/15/2015   PVD (peripheral vascular disease) ([place])    Diabetic ulcer of toe of right foot associated with type 2 diabetes mellitus, with necrosis of muscle ([place])    Diverticulitis of large intestine without perforation or abscess without bleeding    Colitis 08/26/2015   Esophageal candidiasis (HCC)    SOB (shortness of breath)    Acute renal failure superimposed on stage 4 chronic kidney disease ([place])    Encounter for long-term (current) use of other high-risk medications    Dysphagia 07/05/2015   Asthma with allergic rhinitis 06/04/2015   Hypertensive heart disease 03/18/2015   Malnutrition of moderate degree 01/28/2015   Syncope 01/27/2015   Symptomatic bradycardia 01/21/2015   Hypotension due to drugs 01/21/2015   Protein-calorie malnutrition, severe 01/12/2015   Hypertension  associated with diabetes ([place]) 01/12/2015   Cerebrovascular disease 07/14/2014   Diabetic polyneuropathy associated with type 2 diabetes mellitus ([place]) 07/14/2014   Hyperlipidemia 07/14/2014   Gastric outlet obstruction 05/21/2014   Unstable gait 04/29/2014   Slurred speech    Gastroparesis    Constipation 02/03/2014   Carotid stenosis 02/03/2014   Demand ischemia ([place]) 01/21/2014   Gastric bezoar 01/20/2014   Anemia of chronic disease 01/18/2014   CKD (chronic kidney disease) stage 3, GFR 30-59 ml/min ([place]) 01/18/2014   Depression 09/29/2013   Essential hypertension, benign 09/22/2013   GERD (gastroesophageal reflux disease) 09/22/2013   Hyperlipidemia associated with type 2 diabetes mellitus ([place]) 09/22/2013   Type 2 diabetes mellitus with chronic kidney disease ([place]) 09/22/2013   Persistent atrial fibrillation 09/22/2013   History of completed stroke 09/22/2013   Diabetic neuropathy ([place]) 09/22/2013  Hypothyroidism 09/22/2013   Chronic gastrojejunal ulcer with perforation ([place]) 09/07/2013   Accelerated hypertension 09/07/2013   Other specified abnormal immunological findings in serum 09/07/2013   Red blood cell antibody positive 09/07/2013    Past Surgical History:  Procedure Laterality Date   ABDOMINAL AORTOGRAM N/A 09/21/2016   Procedure: Abdominal Aortogram;  Surgeon: Waynetta Sandy, MD;  Location: [facility];  Service: Cardiovascular;  Laterality: N/A;   ABDOMINAL AORTOGRAM W/LOWER EXTREMITY N/A 10/20/2020   Procedure: ABDOMINAL AORTOGRAM W/LOWER EXTREMITY;  Surgeon: Cherre Robins, MD;  Location: [facility];  Service: Cardiovascular;  Laterality: N/A;   AMPUTATION Left 09/25/2016   Procedure: AMPUTATION DIGIT- LEFT 2ND AND 3RD TOES;  Surgeon: Rosetta Posner, MD;  Location: [facility];  Service: Vascular;  Laterality: Left;   AMPUTATION Right 05/03/2018   Procedure: AMPUTATION RIGHT GREAT TOE;  Surgeon: Rosetta Posner, MD;  Location: [facility];  Service:  Vascular;  Laterality: Right;   CATARACT EXTRACTION W/ INTRAOCULAR LENS  IMPLANT, BILATERAL     CHOLECYSTECTOMY OPEN     COLECTOMY     ESOPHAGOGASTRODUODENOSCOPY N/A 01/19/2014   Procedure: ESOPHAGOGASTRODUODENOSCOPY (EGD);  Surgeon: Irene Shipper, MD;  Location: [facility];  Service: Endoscopy;  Laterality: N/A;   ESOPHAGOGASTRODUODENOSCOPY N/A 01/20/2014   Procedure: ESOPHAGOGASTRODUODENOSCOPY (EGD);  Surgeon: Irene Shipper, MD;  Location: [facility];  Service: Endoscopy;  Laterality: N/A;   ESOPHAGOGASTRODUODENOSCOPY N/A 03/19/2014   Procedure: ESOPHAGOGASTRODUODENOSCOPY (EGD);  Surgeon: Milus Banister, MD;  Location: [facility];  Service: Endoscopy;  Laterality: N/A;   ESOPHAGOGASTRODUODENOSCOPY N/A 07/08/2015   Procedure: ESOPHAGOGASTRODUODENOSCOPY (EGD);  Surgeon: Doran Stabler, MD;  Location: [facility]OSCOPY;  Service: Endoscopy;  Laterality: N/A;   ESOPHAGOGASTRODUODENOSCOPY (EGD) WITH PROPOFOL N/A 09/15/2015   Procedure: ESOPHAGOGASTRODUODENOSCOPY (EGD) WITH PROPOFOL;  Surgeon: Manus Gunning, MD;  Location: [facility];  Service: Gastroenterology;  Laterality: N/A;   ESOPHAGOGASTRODUODENOSCOPY (EGD) WITH PROPOFOL N/A 10/12/2020   Procedure: ESOPHAGOGASTRODUODENOSCOPY (EGD) WITH PROPOFOL;  Surgeon: Gatha Mayer, MD;  Location: [facility];  Service: Endoscopy;  Laterality: N/A;   GASTROJEJUNOSTOMY     hx/notes 01/10/2015   GASTROJEJUNOSTOMY N/A 09/23/2015   Procedure: OPEN GASTROJEJUNOSTOMY TUBE PLACEMENT;  Surgeon: Arta Bruce Kinsinger, MD;  Location: [facility];  Service: General;  Laterality: N/A;   GLAUCOMA SURGERY Bilateral    HEMATOMA EVACUATION Right 10/20/2020   Procedure: EVACUATION HEMATOMA RIGHT GROIN;  Surgeon: Cherre Robins, MD;  Location: [facility];  Service: Vascular;  Laterality: Right;   IR CM INJ ANY COLONIC TUBE W/FLUORO  01/05/2017   IR CM INJ ANY COLONIC TUBE W/FLUORO  06/07/2017   IR CM INJ ANY COLONIC TUBE W/FLUORO  11/05/2017   IR CM INJ ANY  COLONIC TUBE W/FLUORO  09/18/2018   IR CM INJ ANY COLONIC TUBE W/FLUORO  03/06/2019   IR CM INJ ANY COLONIC TUBE W/FLUORO  03/11/2019   IR GASTR TUBE CONVERT GASTR-JEJ PER W/FL MOD SED  04/30/2020   IR GENERIC HISTORICAL  01/07/2016   IR GJ TUBE CHANGE 01/07/2016 Jacqulynn Cadet, MD [facility]   IR GENERIC HISTORICAL  01/27/2016   IR MECH REMOV OBSTRUC MAT ANY COLON TUBE W/FLUORO 01/27/2016 Markus Daft, MD [facility]   IR GENERIC HISTORICAL  02/07/2016   IR PATIENT EVAL TECH 0-60 MINS Darrell K Allred, PA-C [facility]   IR GENERIC HISTORICAL  02/08/2016   IR Gerber TUBE CHANGE 02/08/2016 Greggory Keen, MD [facility]   IR GENERIC HISTORICAL  01/06/2016  IR GJ TUBE CHANGE 01/06/2016 [facility] OUT REF   IR GENERIC HISTORICAL  05/02/2016   IR CM INJ ANY COLONIC TUBE W/FLUORO 05/02/2016 Arne [place], MD [facility]   IR GENERIC HISTORICAL  05/15/2016   IR GJ TUBE CHANGE 05/15/2016 Sandi Mariscal, MD [facility]   IR GENERIC HISTORICAL  06/28/2016   IR GJ TUBE CHANGE 06/28/2016 [facility]   IR GJ TUBE CHANGE  02/20/2017   IR GJ TUBE CHANGE  05/10/2017   IR GJ TUBE CHANGE  07/06/2017   IR GJ TUBE CHANGE  08/02/2017   IR GJ TUBE CHANGE  10/15/2017   IR GJ TUBE CHANGE  12/26/2017   IR GJ TUBE CHANGE  04/08/2018   IR GJ TUBE CHANGE  06/19/2018   IR GJ TUBE CHANGE  03/03/2019   IR GJ TUBE CHANGE  06/06/2019   IR GJ TUBE CHANGE  09/26/2019   IR GJ TUBE CHANGE  01/09/2020   IR GJ TUBE CHANGE  05/12/2020   IR GJ TUBE CHANGE  10/05/2020   IR GJ TUBE CHANGE  10/14/2020   IR GJ TUBE CHANGE  10/19/2020   IR PATIENT EVAL TECH 0-60 MINS  10/19/2016   IR PATIENT EVAL TECH 0-60 MINS  12/25/2016   IR PATIENT EVAL TECH 0-60 MINS  01/29/2017   IR PATIENT EVAL TECH 0-60 MINS  04/04/2017   IR PATIENT EVAL TECH 0-60 MINS  04/30/2017   IR PATIENT EVAL TECH 0-60 MINS  07/31/2017   IR REPLC DUODEN/JEJUNO TUBE PERCUT W/FLUORO  11/14/2016   LAPAROTOMY N/A 01/20/2015   Procedure: EXPLORATORY LAPAROTOMY;   Surgeon: Coralie Keens, MD;  Location: [facility];  Service: General;  Laterality: N/A;   LOWER EXTREMITY ANGIOGRAPHY Left 09/21/2016   Procedure: Lower Extremity Angiography;  Surgeon: Waynetta Sandy, MD;  Location: [facility];  Service: Cardiovascular;  Laterality: Left;   LOWER EXTREMITY ANGIOGRAPHY Right 06/27/2017   Procedure: Lower Extremity Angiography;  Surgeon: Waynetta Sandy, MD;  Location: [facility];  Service: Cardiovascular;  Laterality: Right;   LYSIS OF ADHESION N/A 01/20/2015   Procedure: LYSIS OF ADHESIONS < 1 HOUR;  Surgeon: Coralie Keens, MD;  Location: [facility];  Service: General;  Laterality: N/A;   PERIPHERAL VASCULAR BALLOON ANGIOPLASTY Left 09/21/2016   Procedure: Peripheral Vascular Balloon Angioplasty;  Surgeon: Waynetta Sandy, MD;  Location: [facility];  Service: Cardiovascular;  Laterality: Left;  drug coated balloon   PERIPHERAL VASCULAR BALLOON ANGIOPLASTY Right 06/27/2017   Procedure: PERIPHERAL VASCULAR BALLOON ANGIOPLASTY;  Surgeon: Waynetta Sandy, MD;  Location: [facility];  Service: Cardiovascular;  Laterality: Right;  [id number]   PERIPHERAL VASCULAR INTERVENTION Left 10/20/2020   Procedure: PERIPHERAL VASCULAR INTERVENTION;  Surgeon: Cherre Robins, MD;  Location: [facility];  Service: Cardiovascular;  Laterality: Left;   TONSILLECTOMY     TRANSMETATARSAL AMPUTATION Right 11/14/2019   Procedure: TRANSMETATARSAL AMPUTATION RIGHT FOOT;  Surgeon: Edrick Kins, DPM;  Location: [facility];  Service: Podiatry;  Laterality: Right;   TUBAL LIGATION     VENTRAL HERNIA REPAIR  2015   incarcerated ventral hernia ([facility] 09/2013)/notes 01/10/2015   WOUND EXPLORATION Right 10/20/2020   Procedure: PRIMARY CLOSURE COMMON FEMORAL ARTERY;  Surgeon: Cherre Robins, MD;  Location: [facility];  Service: Vascular;  Laterality: Right;     OB History   No obstetric history on file.     Family History   Problem Relation Age of Onset   Stroke Mother  Hypertension Mother    Diabetes Brother    Glaucoma Son    Glaucoma Son    Heart attack Neg Hx    Colon cancer Neg Hx    Stomach cancer Neg Hx     Social History   Tobacco Use   Smoking status: Never   Smokeless tobacco: Former    Types: Snuff    Quit date: 04/03/1980   Tobacco comments:    "used snuff in my younger days"  Vaping Use   Vaping Use: Never used  Substance Use Topics   Alcohol use: Never    Alcohol/week: 0.0 standard drinks   Drug use: Never    Home Medications Prior to Admission medications   Medication Sig Start Date End Date Taking? Authorizing Provider  acetaminophen (TYLENOL) 500 MG tablet Take 500 mg by mouth every 6 (six) hours as needed for moderate pain.   Yes [provider]  amLODipine (NORVASC) 5 MG tablet TAKE 1 TABLET BY MOUTH EVERY DAY Patient taking differently: Take 5 mg by mouth daily. 10/25/20  Yes [name], NP  amoxicillin-clavulanate (AUGMENTIN) 500-125 MG tablet Take 1 tablet (500 mg total) by mouth 2 (two) times daily. Patient taking differently: Take 1 tablet by mouth See admin instructions. Bid x 15 days 12/20/20  Yes Rosiland Oz, MD  aspirin 81 MG EC tablet Take 1 tablet (81 mg total) by mouth daily. Swallow whole. 10/20/20 10/20/21 Yes Cherre Robins, MD  atorvastatin (LIPITOR) 10 MG tablet TAKE 1 TABLET BY MOUTH EVERY DAY Patient taking differently: Take 10 mg by mouth daily. 11/05/20  Yes [name], NP  b complex vitamins tablet Take 1 tablet by mouth daily.    Yes [provider]  cholecalciferol (VITAMIN D3) 25 MCG (1000 UNIT) tablet Take 1,000 Units by mouth daily.   Yes [provider]  clopidogrel (PLAVIX) 75 MG tablet Take 1 tablet (75 mg total) by mouth daily. 10/20/20 10/20/21 Yes Cherre Robins, MD  cycloSPORINE (RESTASIS) 0.05 % ophthalmic emulsion USE 1 DROP INTO BOTH EYES TWICE DAILY Patient taking differently: Place 1 drop  into both eyes 2 (two) times daily. 01/29/17  Yes Gildardo Cranker, DO  doxycycline (VIBRA-TABS) 100 MG tablet Take 1 tablet (100 mg total) by mouth 2 (two) times daily for 14 doses. Patient taking differently: Take 100 mg by mouth See admin instructions. Bid x 14 days 12/20/20 12/27/20 Yes Manandhar, Collene Mares, MD  DULoxetine (CYMBALTA) 30 MG capsule TAKE 1 CAPSULE BY MOUTH EVERY DAY Patient taking differently: Take 30 mg by mouth daily. 05/05/20  Yes [name], NP  ferrous sulfate 220 (44 Fe) MG/5ML solution TAKE 7.4 MLS (325 MG TOTAL) BY MOUTH DAILY. 12/16/20  Yes [name], NP  fluticasone (FLONASE) 50 MCG/ACT nasal spray 2 sprays into each nostril daily. 12/09/20  Yes [name], NP  furosemide (LASIX) 40 MG tablet TAKE 0.5-1 TABLETS (20-40 MG TOTAL) BY MOUTH SEE ADMIN INSTRUCTIONS. TAKE 20 MG DAILY, MAY INSTEAD TAKE 40 MG DAILY AS NEEDED FOR EDEMA 08/04/20  Yes Freada Bergeron, MD  gabapentin (NEURONTIN) 300 MG capsule TAKE 1 CAPSULE BY MOUTH TWICE A DAY Patient taking differently: Take 300 mg by mouth 2 (two) times daily. 11/26/20  Yes [name], NP  gentamicin cream (GARAMYCIN) 0.1 % Apply 1 application topically daily as needed (dry skin). 10/01/19  Yes Edrick Kins, DPM  hydrALAZINE (APRESOLINE) 50 MG tablet TAKE 1 TABLET BY MOUTH THREE TIMES A DAY Patient taking  differently: Take 50 mg by mouth 3 (three) times daily. 04/26/20  Yes Dorothy Spark, MD  Insulin Pen Needle (B-D UF III MINI PEN NEEDLES) 31G X 5 MM MISC Use twice daily with giving insulin injections. Dx E11.9 07/17/18  Yes [name], NP  ipratropium-albuterol (DUONEB) 0.5-2.5 (3) MG/3ML SOLN Take 3 mLs by nebulization every 6 (six) hours as needed (shortness of breath).   Yes [provider]  isosorbide mononitrate (IMDUR) 30 MG 24 hr tablet TAKE 2 TABLETS BY MOUTH DAILY. Patient taking differently: Take 60 mg by mouth daily. 09/20/20  Yes Pemberton, Greer Ee, MD  latanoprost  (XALATAN) 0.005 % ophthalmic solution Place 1 drop into both eyes at bedtime.   Yes [provider]  levalbuterol (XOPENEX HFA) 45 MCG/ACT inhaler Inhale 2 puffs into the lungs every 6 (six) hours as needed for wheezing.   Yes [provider]  levofloxacin (LEVAQUIN) 750 MG tablet Take 1 tablet (750 mg total) by mouth every other day for 15 days. Patient taking differently: Take 750 mg by mouth See admin instructions. 1 tablet every other day x 15 days 12/20/20 01/04/21 Yes Manandhar, Collene Mares, MD  levothyroxine (SYNTHROID) 125 MCG tablet TAKE 1 TABLET BY MOUTH EVERY DAY Patient taking differently: Take 125 mcg by mouth daily before breakfast. 11/01/20  Yes [name], NP  loratadine (CLARITIN) 10 MG tablet Take 10 mg by mouth daily.   Yes [provider]  mometasone-formoterol (DULERA) 200-5 MCG/ACT AERO INHALE TWO PUFFS BY MOUTH TWICE DAILY Patient taking differently: Inhale 2 puffs into the lungs in the morning and at bedtime. 10/05/20  Yes [name], NP  Multiple Vitamin (MULTIVITAMIN WITH MINERALS) TABS tablet Take 1 tablet by mouth daily.   Yes [provider]  Nutritional Supplements (FEEDING SUPPLEMENT, OSMOLITE 1.5 CAL,) LIQD Place 711 mLs into feeding tube See admin instructions. Take 711 ml over a 12 hour period per day 9 am-9 pm at a rate of 65   Yes [provider]  nystatin cream (MYCOSTATIN) APPLY TO AFFECTED AREA TWICE A DAY Patient taking differently: Apply 1 application topically 2 (two) times daily. 05/10/20  Yes [name], NP  oxyCODONE (ROXICODONE) 5 MG immediate release tablet Take 0.5-1 tablets (2.5-5 mg total) by mouth every 8 (eight) hours as needed for severe pain. 10/05/20  Yes [name], NP  pantoprazole (PROTONIX) 40 MG tablet Take 1 tablet (40 mg total) by mouth 2 (two) times daily. 10/15/20  Yes [name], NP  Polyethyl Glycol-Propyl Glycol (SYSTANE) 0.4-0.3 % GEL ophthalmic gel Place 1  application into both eyes 2 (two) times daily as needed (moisture).   Yes [provider]  Probiotic Product (EQL PROBIOTIC COLON SUPPORT PO) Take 1 capsule by mouth daily.   Yes [provider]  senna-docusate (SENOKOT-S) 8.6-50 MG tablet Take 1 tablet by mouth at bedtime.   Yes [provider]  shark liver oil-cocoa butter (PREPARATION H) 0.25-3-85.5 % suppository Place 1 suppository rectally as needed for hemorrhoids.   Yes [provider]  traMADol (ULTRAM) 50 MG tablet Place 1 tablet (50 mg total) into feeding tube every 6 (six) hours as needed for severe pain. 10/22/20  Yes Ulyses Amor, PA-C  Water For Irrigation, Sterile (FREE WATER) SOLN Place 180 mLs into feeding tube 3 (three) times daily.   Yes [provider]  White Petrolatum-Mineral Oil (SYSTANE NIGHTTIME) OINT Place 1 application into both eyes at bedtime.    Yes [provider]  nystatin (MYCOSTATIN) 100000 UNIT/ML suspension Take 5 mLs (500,000 Units total) by mouth 4 (four) times daily. Patient not taking: Reported on 12/21/2020 09/06/20   [name], NP  nystatin (MYCOSTATIN/NYSTOP) powder Apply 1 application topically as needed (skin irritation). Patient not taking: No sig reported 09/11/19   Ngetich, Dinah C, NP    Allergies    Penicillins  Review of Systems   Review of Systems  Constitutional:  Negative for fever.  Respiratory:  Negative for shortness of breath.   Cardiovascular:  Negative for chest pain.  Gastrointestinal:  Negative for abdominal pain.  Genitourinary:  Negative for dysuria.  Musculoskeletal:  Positive for arthralgias.  Skin:  Positive for wound.  Allergic/Immunologic: Positive for immunocompromised state.  Neurological:  Negative for weakness.  Psychiatric/Behavioral:  Positive for confusion.        Resolved change in mental status  All other systems reviewed and are negative.  Physical Exam Updated Vital Signs BP (!) 155/64   Pulse  67   Temp 98.9 F (37.2 C) (Oral)   Resp (!) 22   SpO2 100%   Physical Exam Vitals and nursing note reviewed.  Constitutional:      General: She is not in acute distress.    Appearance: She is well-developed. She is not diaphoretic.  HENT:     Head: Normocephalic and atraumatic.  Cardiovascular:     Rate and Rhythm: Normal rate and regular rhythm.     Heart sounds: Normal heart sounds.  Pulmonary:     Effort: Pulmonary effort is normal.     Breath sounds: Normal breath sounds.  Abdominal:     Palpations: Abdomen is soft.     Tenderness: There is no abdominal tenderness.  Musculoskeletal:        General: Tenderness present.     Comments: Left foot with open wound to medial aspect 1st MTP Right foot with amputation with open ulcer with trace purulent drainage Doppler pulses  Skin:    General: Skin is warm and dry.  Neurological:     Mental Status: She is alert and oriented to person, place, and time.  Psychiatric:        Behavior: Behavior normal.    ED Results / Procedures / Treatments   Labs (all labs ordered are listed, but only abnormal results are displayed) Labs Reviewed  CBC WITH DIFFERENTIAL/PLATELET - Abnormal; Notable for the following components:      Result Value   RBC 2.89 (*)    Hemoglobin 9.2 (*)    HCT 28.0 (*)    RDW 17.8 (*)    Lymphs Abs 0.5 (*)    All other components within normal limits  COMPREHENSIVE METABOLIC PANEL - Abnormal; Notable for the following components:   Glucose, Bld 122 (*)    BUN 70 (*)    Creatinine, Ser 1.65 (*)    Albumin 3.0 (*)    GFR, Estimated 28 (*)    All other components within normal limits  RESP PANEL BY RT-PCR (FLU A&B, COVID) ARPGX2  CULTURE, BLOOD (ROUTINE X 2)  CULTURE, BLOOD (ROUTINE X 2)  MRSA NEXT GEN BY PCR, NASAL  URINALYSIS, ROUTINE W REFLEX MICROSCOPIC  LACTIC ACID, PLASMA  BASIC METABOLIC PANEL  CBC    EKG None  Radiology MR FOOT RIGHT WO CONTRAST  Result Date: 12/21/2020 CLINICAL DATA:   Foot pain, chronic, osteoarthritis suspected EXAM: MRI OF THE RIGHT FOREFOOT WITHOUT CONTRAST TECHNIQUE: Multiplanar, multisequence MR imaging of the right forefoot was  performed. No intravenous contrast was administered. COMPARISON:  X-ray 12/13/2020 FINDINGS: Bones/Joint/Cartilage Patient is status post a transmetatarsal amputation of the first through fifth rays. Destructive changes at the third metatarsal resection margin with prominent bone marrow edema extending to the level of the third metatarsal proximal metaphysis. Intermediate to low T1 signal to the level of the mid third metatarsal diaphysis, compatible with acute osteomyelitis. There is bone marrow edema at the resection margin at the level of the fifth metatarsal diaphysis concerning for early acute osteomyelitis. The residual first, second, and fourth metatarsals are intact without evidence to suggest acute osteomyelitis at this time. Bony structures of the midfoot and visualized hindfoot remain intact. No fractures. No malalignment. Ligaments Intact Lisfranc ligament. Muscles and Tendons Chronic denervation changes of the intrinsic foot musculature. Diffuse intramuscular edema-like signal may reflect myositis and/or denervation. Post amputation changes to the musculotendinous structures of the right forefoot. Soft tissues Diffuse soft tissue swelling at the distal stump as well as at the dorsum of the forefoot. Superficial ulceration at the distal stump distal to the level of the third metatarsal. No organized or drainable fluid collections. IMPRESSION: 1. Acute osteomyelitis of the right third metatarsal. 2. Bone marrow edema at the resection margin of the fifth metatarsal concerning for early acute osteomyelitis. 3. Diffuse soft tissue swelling at the distal stump as well as at the dorsum of the forefoot. Superficial ulceration at the distal stump distal to the level of the third metatarsal. No organized or drainable fluid collections.  Electronically Signed   By: Davina Poke D.O.   On: 12/21/2020 14:25   MR FOOT LEFT WO CONTRAST  Result Date: 12/21/2020 CLINICAL DATA:  Chronic foot pain EXAM: MRI OF THE LEFT FOOT WITHOUT CONTRAST TECHNIQUE: Multiplanar, multisequence MR imaging of the left forefoot was performed. No intravenous contrast was administered. COMPARISON:  Radiographs from 12/13/2020 FINDINGS: Bones/Joint/Cartilage The second and third toes are absent past the distal metatarsal level. There is lateral dislocation of the base of the proximal phalanx of the great toe with respect to the first metatarsal head, with erosion and potentially bony destruction of a substantial portion of the first metatarsal head medially and extensive marrow edema in the distal half of the first metatarsal. There is also angulation at the first MTP joint compatible with hallux valgus. There is likewise marrow edema in the proximal phalanx great toe and in the adjacent sesamoids, with erosion of the medial base of the proximal phalanx. Transverse fracture of the distal head of the fourth metacarpal. Notable surrounding edema tracking all the way back into the base of the fourth metacarpal. A component of superimposed osteomyelitis is not excluded. Faint edema anteriorly in the calcaneus on image 13 series 8, nonspecific. Ligaments The Lisfranc ligament appears intact. Given the dislocation, ligamentous structures at the first MTP joint are thought to be insufficient. Muscles and Tendons Muscular atrophy in the forefoot. Soft tissues Subcutaneous edema in the soft tissues surrounding the first MTP joint, possibly cellulitis. IMPRESSION: 1. Dislocation and hallux valgus at the first MTP joint, with bony erosions particularly in the head of the first metatarsal medially, but also in the base of the proximal phalanx, with marrow edema in both of the structures. Although gout can sometimes cause a similar arthropathy, the appearance is concerning for  osteomyelitis and possibly septic joint. 2. Transverse fracture of the head of the third metatarsal. The degree of edema throughout the bone is somewhat greater than I would expect for transverse fracture, and raise the  possibility of superimposed osteomyelitis. 3. The second and third digits appear amputated at the distal metatarsal level. Electronically Signed   By: Van Clines M.D.   On: 12/21/2020 14:30   DG Foot Complete Left  Result Date: 12/21/2020 Please see detailed radiograph report in office note.  DG Foot Complete Right  Result Date: 12/21/2020 Please see detailed radiograph report in office note.   Procedures Procedures   Medications Ordered in ED Medications  piperacillin-tazobactam (ZOSYN) IVPB 3.375 g (3.375 g Intravenous New Bag/Given 12/21/20 2223)  0.9 %  sodium chloride infusion ( Intravenous New Bag/Given 12/21/20 2224)    ED Course  I have reviewed the triage vital signs and the nursing notes.  Pertinent labs & imaging results that were available during my care of the patient were reviewed by me and considered in my medical decision making (see chart for details).  Clinical Course as of 12/21/20 2308  Tue Sep 20, 583  [id number] 87 year old female with known bilateral foot wounds with concern for osteomyelitis on chart review from recent ID and podiatry, presents for episode of altered mental status earlier today, resolved, now reports pain in her feet which is new for her.  Wound culture with enterococcus faecalis, sensitive to vanc from 12/13/20. [LM]  2306 Hgb 9.2, unchanged from prior. WBC normal. CMP with Cr 1.65, BUN 70 progressively worsening but not significantly worse today.  UA normal. COVID/flu negative.  MRI of both feet concerning for osteomyelitis.  [LM]  2307 Discussed results with patient and her daughter. Patient does not want more surgery at this point but is open to IV antibiotics and podiatry consult tomorrow.  Case discussed with Dr. Alcario Drought  with [facility] who will consult for admission and will order antibiotics after further chart review.  [LM]    Clinical Course User Index [LM] Roque Lias   MDM Rules/Calculators/A&P                           Final Clinical Impression(s) / ED Diagnoses Final diagnoses:  Other osteomyelitis of foot, unspecified laterality [facility])    Rx / DC Orders ED Discharge Orders     None        Roque Lias 12/21/20 2309    Valarie Merino, MD 12/23/20 [phone]

## 2020-12-21 NOTE — H&P (Signed)
History and Physical    LEEBA CHILLEMI RUE:[id number] DOB: Oct 21, 1923 DOA: 12/21/2020  PCP: Sharon Seller, NP  Patient coming from: Home  I have personally briefly reviewed patient's old medical records in [facility]  Chief Complaint: Generalized weakness  HPI: Shannon [name] is a 87 y.o. female with medical history significant of DM2, HTN, CKD 4, gastric stenosis s/p feeding tube.  Pt normally alert and talking at baseline.  Daughter arrived today to flush her feeding tube.  Pt wasn't responsive.  Daughter called 911.  Pt started to wake up while awaiting for EMS, c/o bilateral foot pain.  Pt has known B foot ulcers with infections.  Has declined amputations in past.  Looks like vascular surgery saw her and tried angioplasty and stenting a couple of months ago.  Looks like she is following with podiatry.  Just saw ID yesterday.  Started on levaquin, augmentin yesterday by ID who added these to doxycycline she was already taking.  Mental status back to baseline at this time.   ED Course: Pt with AKI: Creat 1.65 only slightly up from 1.5 range in July; however, BUN 70 up from 40 in July.  MRIs of BLE show findings worrisome for osteomyelitis   Review of Systems: As per HPI, otherwise all review of systems negative.  Past Medical History:  Diagnosis Date   Acute respiratory failure (HCC)    Anemia    previous blood transfusions   Arthritis    "all over"   Asthma    Atrial flutter (HCC)    Bradycardia    requiring previous d/c of BB and reduction of amiodarone   CAD (coronary artery disease)    nonobstructive per notes   Chronic diastolic CHF (congestive heart failure) (HCC)    CKD (chronic kidney disease), stage III (HCC)    Complication of blood transfusion    "got the wrong blood type at [facility] in ~ 2015; no adverse reaction that we are aware of"/daughter, [name] (01/27/2016)   COPD (chronic obstructive pulmonary disease) (HCC)    Depression    "light  case"   DVT (deep venous thrombosis) (HCC) 01/2016   a. LLE DVT 01/2016 - switched from Eliquis to Coumadin.   Dyspnea    with some activity   Gastric stenosis    a. s/p stomach tube   GERD (gastroesophageal reflux disease)    Headache    History of 2019 novel coronavirus disease (COVID-19)    History of blood transfusion    "several" (01/27/2016)   History of stomach ulcers    Hyperlipidemia    Hypertension    Hypothyroidism    On home oxygen therapy    "2L; 9PM til 9AM" (06/27/2017)   PAD (peripheral artery disease) (HCC)    a. prior gangrene, toe amputations, intervention.   PAF (paroxysmal atrial fibrillation) (HCC)    Paraesophageal hernia    Perforated gastric ulcer (HCC)    Pneumonia    "a few times" (06/27/2017)   Seasonal allergies    SIADH (syndrome of inappropriate ADH production) (HCC)    [name] 01/10/2015   Small bowel obstruction (HCC)    "I don't know how many" (01/11/2015)   Stroke (HCC)    several- no residual   Type II diabetes mellitus (HCC)    "related to prednisone use  for > 20 yr; once predinose stopped; no more DM RX" (01/27/2016)   UTI (urinary tract infection) 02/08/2016   Ventral hernia with bowel obstruction  Past Surgical History:  Procedure Laterality Date   ABDOMINAL AORTOGRAM N/A 09/21/2016   Procedure: Abdominal Aortogram;  Surgeon: Maeola Harman, MD;  Location: [facility] INVASIVE CV LAB;  Service: Cardiovascular;  Laterality: N/A;   ABDOMINAL AORTOGRAM W/LOWER EXTREMITY N/A 10/20/2020   Procedure: ABDOMINAL AORTOGRAM W/LOWER EXTREMITY;  Surgeon: Leonie Douglas, MD;  Location: [facility] INVASIVE CV LAB;  Service: Cardiovascular;  Laterality: N/A;   AMPUTATION Left 09/25/2016   Procedure: AMPUTATION DIGIT- LEFT 2ND AND 3RD TOES;  Surgeon: Larina Earthly, MD;  Location: [facility] OR;  Service: Vascular;  Laterality: Left;   AMPUTATION Right 05/03/2018   Procedure: AMPUTATION RIGHT GREAT TOE;  Surgeon: Larina Earthly, MD;  Location: [facility] OR;  Service:  Vascular;  Laterality: Right;   CATARACT EXTRACTION W/ INTRAOCULAR LENS  IMPLANT, BILATERAL     CHOLECYSTECTOMY OPEN     COLECTOMY     ESOPHAGOGASTRODUODENOSCOPY N/A 01/19/2014   Procedure: ESOPHAGOGASTRODUODENOSCOPY (EGD);  Surgeon: Hilarie Fredrickson, MD;  Location: [facility] ENDOSCOPY;  Service: Endoscopy;  Laterality: N/A;   ESOPHAGOGASTRODUODENOSCOPY N/A 01/20/2014   Procedure: ESOPHAGOGASTRODUODENOSCOPY (EGD);  Surgeon: Hilarie Fredrickson, MD;  Location: [facility] ENDOSCOPY;  Service: Endoscopy;  Laterality: N/A;   ESOPHAGOGASTRODUODENOSCOPY N/A 03/19/2014   Procedure: ESOPHAGOGASTRODUODENOSCOPY (EGD);  Surgeon: Rachael Fee, MD;  Location: [facility] ENDOSCOPY;  Service: Endoscopy;  Laterality: N/A;   ESOPHAGOGASTRODUODENOSCOPY N/A 07/08/2015   Procedure: ESOPHAGOGASTRODUODENOSCOPY (EGD);  Surgeon: Sherrilyn Rist, MD;  Location: [facility] ENDOSCOPY;  Service: Endoscopy;  Laterality: N/A;   ESOPHAGOGASTRODUODENOSCOPY (EGD) WITH PROPOFOL N/A 09/15/2015   Procedure: ESOPHAGOGASTRODUODENOSCOPY (EGD) WITH PROPOFOL;  Surgeon: Ruffin Frederick, MD;  Location: [facility] ENDOSCOPY;  Service: Gastroenterology;  Laterality: N/A;   ESOPHAGOGASTRODUODENOSCOPY (EGD) WITH PROPOFOL N/A 10/12/2020   Procedure: ESOPHAGOGASTRODUODENOSCOPY (EGD) WITH PROPOFOL;  Surgeon: Iva Boop, MD;  Location: [facility] ENDOSCOPY;  Service: Endoscopy;  Laterality: N/A;   GASTROJEJUNOSTOMY     hx/notes 01/10/2015   GASTROJEJUNOSTOMY N/A 09/23/2015   Procedure: OPEN GASTROJEJUNOSTOMY TUBE PLACEMENT;  Surgeon: De Blanch Kinsinger, MD;  Location: [facility] ORS;  Service: General;  Laterality: N/A;   GLAUCOMA SURGERY Bilateral    HEMATOMA EVACUATION Right 10/20/2020   Procedure: EVACUATION HEMATOMA RIGHT GROIN;  Surgeon: Leonie Douglas, MD;  Location: [facility] OR;  Service: Vascular;  Laterality: Right;   IR CM INJ ANY COLONIC TUBE W/FLUORO  01/05/2017   IR CM INJ ANY COLONIC TUBE W/FLUORO  06/07/2017   IR CM INJ ANY COLONIC TUBE W/FLUORO  11/05/2017   IR CM INJ ANY  COLONIC TUBE W/FLUORO  09/18/2018   IR CM INJ ANY COLONIC TUBE W/FLUORO  03/06/2019   IR CM INJ ANY COLONIC TUBE W/FLUORO  03/11/2019   IR GASTR TUBE CONVERT GASTR-JEJ PER W/FL MOD SED  04/30/2020   IR GENERIC HISTORICAL  01/07/2016   IR GJ TUBE CHANGE 01/07/2016 Malachy Moan, MD [facility]   IR GENERIC HISTORICAL  01/27/2016   IR MECH REMOV OBSTRUC MAT ANY COLON TUBE W/FLUORO 01/27/2016 Richarda Overlie, MD [facility]   IR GENERIC HISTORICAL  02/07/2016   IR PATIENT EVAL TECH 0-60 MINS Darrell K Allred, PA-C [facility]   IR GENERIC HISTORICAL  02/08/2016   IR GJ TUBE CHANGE 02/08/2016 Berdine Dance, MD [facility]   IR GENERIC HISTORICAL  01/06/2016   IR GJ TUBE CHANGE 01/06/2016 [facility] OUT REF   IR GENERIC HISTORICAL  05/02/2016   IR CM INJ ANY COLONIC TUBE W/FLUORO 05/02/2016 Oley Balm, MD [facility]   IR GENERIC HISTORICAL  05/15/2016  IR GJ TUBE CHANGE 05/15/2016 Simonne Come, MD [facility]   IR GENERIC HISTORICAL  06/28/2016   IR GJ TUBE CHANGE 06/28/2016 [facility]   IR GJ TUBE CHANGE  02/20/2017   IR GJ TUBE CHANGE  05/10/2017   IR GJ TUBE CHANGE  07/06/2017   IR GJ TUBE CHANGE  08/02/2017   IR GJ TUBE CHANGE  10/15/2017   IR GJ TUBE CHANGE  12/26/2017   IR GJ TUBE CHANGE  04/08/2018   IR GJ TUBE CHANGE  06/19/2018   IR GJ TUBE CHANGE  03/03/2019   IR GJ TUBE CHANGE  06/06/2019   IR GJ TUBE CHANGE  09/26/2019   IR GJ TUBE CHANGE  01/09/2020   IR GJ TUBE CHANGE  05/12/2020   IR GJ TUBE CHANGE  10/05/2020   IR GJ TUBE CHANGE  10/14/2020   IR GJ TUBE CHANGE  10/19/2020   IR PATIENT EVAL TECH 0-60 MINS  10/19/2016   IR PATIENT EVAL TECH 0-60 MINS  12/25/2016   IR PATIENT EVAL TECH 0-60 MINS  01/29/2017   IR PATIENT EVAL TECH 0-60 MINS  04/04/2017   IR PATIENT EVAL TECH 0-60 MINS  04/30/2017   IR PATIENT EVAL TECH 0-60 MINS  07/31/2017   IR REPLC DUODEN/JEJUNO TUBE PERCUT W/FLUORO  11/14/2016   LAPAROTOMY N/A 01/20/2015   Procedure: EXPLORATORY LAPAROTOMY;   Surgeon: Abigail Miyamoto, MD;  Location: [facility] OR;  Service: General;  Laterality: N/A;   LOWER EXTREMITY ANGIOGRAPHY Left 09/21/2016   Procedure: Lower Extremity Angiography;  Surgeon: Maeola Harman, MD;  Location: [facility] INVASIVE CV LAB;  Service: Cardiovascular;  Laterality: Left;   LOWER EXTREMITY ANGIOGRAPHY Right 06/27/2017   Procedure: Lower Extremity Angiography;  Surgeon: Maeola Harman, MD;  Location: [facility] INVASIVE CV LAB;  Service: Cardiovascular;  Laterality: Right;   LYSIS OF ADHESION N/A 01/20/2015   Procedure: LYSIS OF ADHESIONS < 1 HOUR;  Surgeon: Abigail Miyamoto, MD;  Location: [facility] OR;  Service: General;  Laterality: N/A;   PERIPHERAL VASCULAR BALLOON ANGIOPLASTY Left 09/21/2016   Procedure: Peripheral Vascular Balloon Angioplasty;  Surgeon: Maeola Harman, MD;  Location: [facility] INVASIVE CV LAB;  Service: Cardiovascular;  Laterality: Left;  drug coated balloon   PERIPHERAL VASCULAR BALLOON ANGIOPLASTY Right 06/27/2017   Procedure: PERIPHERAL VASCULAR BALLOON ANGIOPLASTY;  Surgeon: Maeola Harman, MD;  Location: [facility] INVASIVE CV LAB;  Service: Cardiovascular;  Laterality: Right;  SFA/[id number]   PERIPHERAL VASCULAR INTERVENTION Left 10/20/2020   Procedure: PERIPHERAL VASCULAR INTERVENTION;  Surgeon: Leonie Douglas, MD;  Location: [facility] INVASIVE CV LAB;  Service: Cardiovascular;  Laterality: Left;   TONSILLECTOMY     TRANSMETATARSAL AMPUTATION Right 11/14/2019   Procedure: TRANSMETATARSAL AMPUTATION RIGHT FOOT;  Surgeon: Felecia Shelling, DPM;  Location: [facility] OR;  Service: Podiatry;  Laterality: Right;   TUBAL LIGATION     VENTRAL HERNIA REPAIR  2015   incarcerated ventral hernia ([facility] 09/2013)/notes 01/10/2015   WOUND EXPLORATION Right 10/20/2020   Procedure: PRIMARY CLOSURE COMMON FEMORAL ARTERY;  Surgeon: Leonie Douglas, MD;  Location: [facility] OR;  Service: Vascular;  Laterality: Right;     reports that she has never smoked. She quit smokeless tobacco use  about 40 years ago.  Her smokeless tobacco use included snuff. She reports that she does not drink alcohol and does not use drugs.  Allergies  Allergen Reactions   Penicillins Itching, Rash and Other (See Comments)    Tolerated amoxicillin, unasyn, zosyn & cephalosporins in the past. Did it involve  swelling of the face/tongue/throat, SOB, or low BP? No Did it involve sudden or severe rash/hives, skin peeling, or any reaction on the inside of your mouth or nose? No Did you need to seek medical attention at a hospital or doctor's office? Unknown When did it last happen?      5+ years If all above answers are "NO", may proceed with cephalosporin use.     Family History  Problem Relation Age of Onset   Stroke Mother    Hypertension Mother    Diabetes Brother    Glaucoma Son    Glaucoma Son    Heart attack Neg Hx    Colon cancer Neg Hx    Stomach cancer Neg Hx      Prior to Admission medications   Medication Sig Start Date End Date Taking? Authorizing Provider  acetaminophen (TYLENOL) 500 MG tablet Take 500 mg by mouth every 6 (six) hours as needed for moderate pain.   Yes [provider]  amLODipine (NORVASC) 5 MG tablet TAKE 1 TABLET BY MOUTH EVERY DAY Patient taking differently: Take 5 mg by mouth daily. 10/25/20  Yes Sharon Seller, NP  amoxicillin-clavulanate (AUGMENTIN) 500-125 MG tablet Take 1 tablet (500 mg total) by mouth 2 (two) times daily. Patient taking differently: Take 1 tablet by mouth See admin instructions. Bid x 15 days 12/20/20  Yes Odette Fraction, MD  aspirin 81 MG EC tablet Take 1 tablet (81 mg total) by mouth daily. Swallow whole. 10/20/20 10/20/21 Yes Leonie Douglas, MD  atorvastatin (LIPITOR) 10 MG tablet TAKE 1 TABLET BY MOUTH EVERY DAY Patient taking differently: Take 10 mg by mouth daily. 11/05/20  Yes Sharon Seller, NP  b complex vitamins tablet Take 1 tablet by mouth daily.    Yes [provider]  cholecalciferol (VITAMIN D3) 25  MCG (1000 UNIT) tablet Take 1,000 Units by mouth daily.   Yes [provider]  clopidogrel (PLAVIX) 75 MG tablet Take 1 tablet (75 mg total) by mouth daily. 10/20/20 10/20/21 Yes Leonie Douglas, MD  cycloSPORINE (RESTASIS) 0.05 % ophthalmic emulsion USE 1 DROP INTO BOTH EYES TWICE DAILY Patient taking differently: Place 1 drop into both eyes 2 (two) times daily. 01/29/17  Yes Kirt Boys, DO  doxycycline (VIBRA-TABS) 100 MG tablet Take 1 tablet (100 mg total) by mouth 2 (two) times daily for 14 doses. Patient taking differently: Take 100 mg by mouth See admin instructions. Bid x 14 days 12/20/20 12/27/20 Yes Manandhar, Rozell Searing, MD  DULoxetine (CYMBALTA) 30 MG capsule TAKE 1 CAPSULE BY MOUTH EVERY DAY Patient taking differently: Take 30 mg by mouth daily. 05/05/20  Yes Sharon Seller, NP  ferrous sulfate 220 (44 Fe) MG/5ML solution TAKE 7.4 MLS (325 MG TOTAL) BY MOUTH DAILY. 12/16/20  Yes Sharon Seller, NP  fluticasone (FLONASE) 50 MCG/ACT nasal spray 2 sprays into each nostril daily. 12/09/20  Yes Sharon Seller, NP  furosemide (LASIX) 40 MG tablet TAKE 0.5-1 TABLETS (20-40 MG TOTAL) BY MOUTH SEE ADMIN INSTRUCTIONS. TAKE 20 MG DAILY, MAY INSTEAD TAKE 40 MG DAILY AS NEEDED FOR EDEMA 08/04/20  Yes Meriam Sprague, MD  gabapentin (NEURONTIN) 300 MG capsule TAKE 1 CAPSULE BY MOUTH TWICE A DAY Patient taking differently: Take 300 mg by mouth 2 (two) times daily. 11/26/20  Yes Sharon Seller, NP  gentamicin cream (GARAMYCIN) 0.1 % Apply 1 application topically daily as needed (dry skin). 10/01/19  Yes Felecia Shelling, DPM  hydrALAZINE (APRESOLINE) 50 MG  tablet TAKE 1 TABLET BY MOUTH THREE TIMES A DAY Patient taking differently: Take 50 mg by mouth 3 (three) times daily. 04/26/20  Yes Lars Masson, MD  Insulin Pen Needle (B-D UF III MINI PEN NEEDLES) 31G X 5 MM MISC Use twice daily with giving insulin injections. Dx E11.9 07/17/18  Yes Sharon Seller, NP  ipratropium-albuterol  (DUONEB) 0.5-2.5 (3) MG/3ML SOLN Take 3 mLs by nebulization every 6 (six) hours as needed (shortness of breath).   Yes [provider]  isosorbide mononitrate (IMDUR) 30 MG 24 hr tablet TAKE 2 TABLETS BY MOUTH DAILY. Patient taking differently: Take 60 mg by mouth daily. 09/20/20  Yes Pemberton, Kathlynn Grate, MD  latanoprost (XALATAN) 0.005 % ophthalmic solution Place 1 drop into both eyes at bedtime.   Yes [provider]  levalbuterol (XOPENEX HFA) 45 MCG/ACT inhaler Inhale 2 puffs into the lungs every 6 (six) hours as needed for wheezing.   Yes [provider]  levofloxacin (LEVAQUIN) 750 MG tablet Take 1 tablet (750 mg total) by mouth every other day for 15 days. Patient taking differently: Take 750 mg by mouth See admin instructions. 1 tablet every other day x 15 days 12/20/20 01/04/21 Yes Manandhar, Rozell Searing, MD  levothyroxine (SYNTHROID) 125 MCG tablet TAKE 1 TABLET BY MOUTH EVERY DAY Patient taking differently: Take 125 mcg by mouth daily before breakfast. 11/01/20  Yes Sharon Seller, NP  loratadine (CLARITIN) 10 MG tablet Take 10 mg by mouth daily.   Yes [provider]  mometasone-formoterol (DULERA) 200-5 MCG/ACT AERO INHALE TWO PUFFS BY MOUTH TWICE DAILY Patient taking differently: Inhale 2 puffs into the lungs in the morning and at bedtime. 10/05/20  Yes Sharon Seller, NP  Multiple Vitamin (MULTIVITAMIN WITH MINERALS) TABS tablet Take 1 tablet by mouth daily.   Yes [provider]  Nutritional Supplements (FEEDING SUPPLEMENT, OSMOLITE 1.5 CAL,) LIQD Place 711 mLs into feeding tube See admin instructions. Take 711 ml over a 12 hour period per day 9 am-9 pm at a rate of 65   Yes [provider]  nystatin cream (MYCOSTATIN) APPLY TO AFFECTED AREA TWICE A DAY Patient taking differently: Apply 1 application topically 2 (two) times daily. 05/10/20  Yes Sharon Seller, NP  oxyCODONE (ROXICODONE) 5 MG immediate release tablet Take 0.5-1  tablets (2.5-5 mg total) by mouth every 8 (eight) hours as needed for severe pain. 10/05/20  Yes Sharon Seller, NP  pantoprazole (PROTONIX) 40 MG tablet Take 1 tablet (40 mg total) by mouth 2 (two) times daily. 10/15/20  Yes Sharon Seller, NP  Polyethyl Glycol-Propyl Glycol (SYSTANE) 0.4-0.3 % GEL ophthalmic gel Place 1 application into both eyes 2 (two) times daily as needed (moisture).   Yes [provider]  Probiotic Product (EQL PROBIOTIC COLON SUPPORT PO) Take 1 capsule by mouth daily.   Yes [provider]  senna-docusate (SENOKOT-S) 8.6-50 MG tablet Take 1 tablet by mouth at bedtime.   Yes [provider]  shark liver oil-cocoa butter (PREPARATION H) 0.25-3-85.5 % suppository Place 1 suppository rectally as needed for hemorrhoids.   Yes [provider]  traMADol (ULTRAM) 50 MG tablet Place 1 tablet (50 mg total) into feeding tube every 6 (six) hours as needed for severe pain. 10/22/20  Yes Lars Mage, PA-C  Water For Irrigation, Sterile (FREE WATER) SOLN Place 180 mLs into feeding tube 3 (three) times daily.   Yes [provider]  Rayburn Ma Oil (SYSTANE NIGHTTIME) OINT Place  1 application into both eyes at bedtime.    Yes [provider]    Physical Exam: Vitals:   12/21/20 1018 12/21/20 1549 12/21/20 1945 12/21/20 2200  BP: (!) 152/55 (!) 149/56 (!) 175/66 (!) 155/64  Pulse: 77 70 77 67  Resp: 14 18 16  (!) 22  Temp: 99.1 F (37.3 C) 98.9 F (37.2 C)    TempSrc: Oral Oral    SpO2: 100% 99% 99% 100%    Constitutional: NAD, calm, comfortable, elderly Eyes: PERRL, lids and conjunctivae normal ENMT: Mucous membranes are moist. Posterior pharynx clear of any exudate or lesions.Normal dentition.  Neck: normal, supple, no masses, no thyromegaly Respiratory: clear to auscultation bilaterally, no wheezing, no crackles. Normal respiratory effort. No accessory muscle use.  Cardiovascular: Regular rate and rhythm,  ? Murmur. Abdomen: no tenderness, no masses palpated. No hepatosplenomegaly. Bowel sounds positive.  Musculoskeletal: no clubbing / cyanosis. No joint deformity upper and lower extremities. Good ROM, no contractures. Normal muscle tone.  Skin:    Neurologic: CN 2-12 grossly intact. Sensation intact, DTR normal. Strength 5/5 in all 4.  Psychiatric: Normal judgment and insight. Alert and oriented x 3. Normal mood.    Labs on Admission: I have personally reviewed following labs and imaging studies  CBC: Recent Labs  Lab 12/20/20 1443 12/21/20 1030  WBC 6.0 4.2  NEUTROABS  --  2.5  HGB 9.5* 9.2*  HCT 28.9* 28.0*  MCV 97.6 96.9  PLT 232 194   Basic Metabolic Panel: Recent Labs  Lab 12/20/20 1443 12/21/20 1030  NA 137 136  K 5.1 4.5  CL 101 102  CO2 27 24  GLUCOSE 87 122*  BUN 73* 70*  CREATININE 1.68* 1.65*  CALCIUM 9.9 9.4   GFR: Estimated Creatinine Clearance: 18.7 mL/min (A) (by C-G formula based on SCr of 1.65 mg/dL (H)). Liver Function Tests: Recent Labs  Lab 12/21/20 1030  AST 20  ALT 14  ALKPHOS 76  BILITOT 0.4  PROT 6.9  ALBUMIN 3.0*   No results for input(s): LIPASE, AMYLASE in the last 168 hours. No results for input(s): AMMONIA in the last 168 hours. Coagulation Profile: No results for input(s): INR, PROTIME in the last 168 hours. Cardiac Enzymes: No results for input(s): CKTOTAL, CKMB, CKMBINDEX, TROPONINI in the last 168 hours. BNP (last 3 results) No results for input(s): PROBNP in the last 8760 hours. HbA1C: No results for input(s): HGBA1C in the last 72 hours. CBG: No results for input(s): GLUCAP in the last 168 hours. Lipid Profile: No results for input(s): CHOL, HDL, LDLCALC, TRIG, CHOLHDL, LDLDIRECT in the last 72 hours. Thyroid Function Tests: No results for input(s): TSH, T4TOTAL, FREET4, T3FREE, THYROIDAB in the last 72 hours. Anemia Panel: No results for input(s): VITAMINB12, FOLATE, FERRITIN, TIBC, IRON, RETICCTPCT in the last 72  hours. Urine analysis:    Component Value Date/Time   COLORURINE YELLOW 12/21/2020 2034   APPEARANCEUR CLEAR 12/21/2020 2034   LABSPEC 1.010 12/21/2020 2034   PHURINE 7.5 12/21/2020 2034   GLUCOSEU NEGATIVE 12/21/2020 2034   HGBUR NEGATIVE 12/21/2020 2034   BILIRUBINUR NEGATIVE 12/21/2020 2034   BILIRUBINUR Neg 11/01/2017 1228   KETONESUR NEGATIVE 12/21/2020 2034   PROTEINUR NEGATIVE 12/21/2020 2034   UROBILINOGEN 0.2 11/01/2017 1228   UROBILINOGEN 0.2 01/27/2015 2308   NITRITE NEGATIVE 12/21/2020 2034   LEUKOCYTESUR NEGATIVE 12/21/2020 2034    Radiological Exams on Admission: MR FOOT RIGHT WO CONTRAST  Result Date: 12/21/2020 CLINICAL DATA:  Foot pain, chronic, osteoarthritis suspected EXAM: MRI  OF THE RIGHT FOREFOOT WITHOUT CONTRAST TECHNIQUE: Multiplanar, multisequence MR imaging of the right forefoot was performed. No intravenous contrast was administered. COMPARISON:  X-ray 12/13/2020 FINDINGS: Bones/Joint/Cartilage Patient is status post a transmetatarsal amputation of the first through fifth rays. Destructive changes at the third metatarsal resection margin with prominent bone marrow edema extending to the level of the third metatarsal proximal metaphysis. Intermediate to low T1 signal to the level of the mid third metatarsal diaphysis, compatible with acute osteomyelitis. There is bone marrow edema at the resection margin at the level of the fifth metatarsal diaphysis concerning for early acute osteomyelitis. The residual first, second, and fourth metatarsals are intact without evidence to suggest acute osteomyelitis at this time. Bony structures of the midfoot and visualized hindfoot remain intact. No fractures. No malalignment. Ligaments Intact Lisfranc ligament. Muscles and Tendons Chronic denervation changes of the intrinsic foot musculature. Diffuse intramuscular edema-like signal may reflect myositis and/or denervation. Post amputation changes to the musculotendinous structures  of the right forefoot. Soft tissues Diffuse soft tissue swelling at the distal stump as well as at the dorsum of the forefoot. Superficial ulceration at the distal stump distal to the level of the third metatarsal. No organized or drainable fluid collections. IMPRESSION: 1. Acute osteomyelitis of the right third metatarsal. 2. Bone marrow edema at the resection margin of the fifth metatarsal concerning for early acute osteomyelitis. 3. Diffuse soft tissue swelling at the distal stump as well as at the dorsum of the forefoot. Superficial ulceration at the distal stump distal to the level of the third metatarsal. No organized or drainable fluid collections. Electronically Signed   By: Duanne Guess D.O.   On: 12/21/2020 14:25   MR FOOT LEFT WO CONTRAST  Result Date: 12/21/2020 CLINICAL DATA:  Chronic foot pain EXAM: MRI OF THE LEFT FOOT WITHOUT CONTRAST TECHNIQUE: Multiplanar, multisequence MR imaging of the left forefoot was performed. No intravenous contrast was administered. COMPARISON:  Radiographs from 12/13/2020 FINDINGS: Bones/Joint/Cartilage The second and third toes are absent past the distal metatarsal level. There is lateral dislocation of the base of the proximal phalanx of the great toe with respect to the first metatarsal head, with erosion and potentially bony destruction of a substantial portion of the first metatarsal head medially and extensive marrow edema in the distal half of the first metatarsal. There is also angulation at the first MTP joint compatible with hallux valgus. There is likewise marrow edema in the proximal phalanx great toe and in the adjacent sesamoids, with erosion of the medial base of the proximal phalanx. Transverse fracture of the distal head of the fourth metacarpal. Notable surrounding edema tracking all the way back into the base of the fourth metacarpal. A component of superimposed osteomyelitis is not excluded. Faint edema anteriorly in the calcaneus on image 13  series 8, nonspecific. Ligaments The Lisfranc ligament appears intact. Given the dislocation, ligamentous structures at the first MTP joint are thought to be insufficient. Muscles and Tendons Muscular atrophy in the forefoot. Soft tissues Subcutaneous edema in the soft tissues surrounding the first MTP joint, possibly cellulitis. IMPRESSION: 1. Dislocation and hallux valgus at the first MTP joint, with bony erosions particularly in the head of the first metatarsal medially, but also in the base of the proximal phalanx, with marrow edema in both of the structures. Although gout can sometimes cause a similar arthropathy, the appearance is concerning for osteomyelitis and possibly septic joint. 2. Transverse fracture of the head of the third metatarsal. The degree of edema  throughout the bone is somewhat greater than I would expect for transverse fracture, and raise the possibility of superimposed osteomyelitis. 3. The second and third digits appear amputated at the distal metatarsal level. Electronically Signed   By: Gaylyn Rong M.D.   On: 12/21/2020 14:30   DG Foot Complete Left  Result Date: 12/21/2020 Please see detailed radiograph report in office note.  DG Foot Complete Right  Result Date: 12/21/2020 Please see detailed radiograph report in office note.   EKG: Independently reviewed.  Assessment/Plan Principal Problem:   Osteomyelitis of left foot (HCC) Active Problems:   Type 2 diabetes mellitus with chronic kidney disease (HCC)   Accelerated hypertension   Acute renal failure superimposed on stage 4 chronic kidney disease (HCC)   Chronic diastolic CHF (congestive heart failure) (HCC)   Acute encephalopathy   Osteomyelitis of right foot (HCC)   Diabetic infection of right foot (HCC)   Diabetic infection of left foot (HCC)    B infected foot ulcers, osteomyelitis - MRI shows osteomyelitis on right foot, probable osteomyelitis / septic joint on left foot (vs gout) R foot ulcer  growing out enterococcus x8 days ago L foot ulcer growing out pseudomonas x 3 months ago LE wound pathway Check MRSA PCR nares If positive, add MRSA coverage Due to AKI, and pt age want to stop levaquin, will also hold augmentin and doxy. Will instead put pt on empiric zosyn to cover both the pseudomonas (was sensitive) as well as the amp sensitive enterococcus. Despite PCN allergy, pt has tolerated multiple PCNs including zosyn in past. If non-surgical management is still desired, may want ID consult in AM (may need additional AB added to vanc for cidal activity against enterococcus). Daily labs Needs surgical (either podiatry and/or vasc surg) consult in AM AKF on CKD 4 - Pattern today looks very pre-renal IVF Cont tube feeds and FWF as per home routine Strict intake and output Daily BMP Avoid nephrotoxic meds HTN - Cont home BP meds DM2 - Looks to be diet controlled at this point A1C recently was 5.5 last month Will get CBG checks Q4H initially while here to ensure BGL remains stable dCHF - Holding lasix Not in exacerbation at this time  DVT prophylaxis: Heparin Roslyn Code Status: DNR - discussed with daughter Family Communication: Spoke with daughter on phone Disposition Plan: TBD Consults called: None Admission status: Admit to inpatient  Severity of Illness: The appropriate patient status for this patient is INPATIENT. Inpatient status is judged to be reasonable and necessary in order to provide the required intensity of service to ensure the patient's safety. The patient's presenting symptoms, physical exam findings, and initial radiographic and laboratory data in the context of their chronic comorbidities is felt to place them at high risk for further clinical deterioration. Furthermore, it is not anticipated that the patient will be medically stable for discharge from the hospital within 2 midnights of admission. The following factors support the patient status of inpatient.    IP status for limb threatening infections of BOTH feet with osteomyelitis findings on MRI as described above.  Patient has acute kidney injury.  Patient has one of the following: Increase in Serum Creatinine >0.3 mg/dL within 40J Increase in Serum Creatinine > 1.5 times baseline known or presumed to have been within the last 7 days Urine volume < 0.5 ml/kg/hr for 6 hours    * I certify that at the point of admission it is my clinical judgment that the patient will require inpatient  hospital care spanning beyond 2 midnights from the point of admission due to high intensity of service, high risk for further deterioration and high frequency of surveillance required.*   Johnmatthew Solorio M. DO [facility]  How to contact the [facility] Attending or Consulting provider 7A - 7P or covering provider during after hours 7P -7A, for this patient?  Check the care team in [facility] and look for a) attending/consulting [facility] provider listed and b) the [facility] team listed Log into [email or web address]  [employer] Physician Scheduling and messaging for groups and whole hospitals  On call and physician scheduling software for group practices, residents, hospitalists and other medical providers for call, clinic, rotation and shift schedules. [employer] is a hospital-wide system for scheduling doctors and paging doctors on call. EasyPlot is for scientific plotting and data analysis.  [email or web address]  and use [facility]'s universal password to access. If you do not have the password, please contact the hospital operator.  Locate the [facility] provider you are looking for under [facility] and page to a number that you can be directly reached. If you still have difficulty reaching the provider, please page the [facility] (Director on Call) for the Hospitalists listed on amion for assistance.  12/21/2020, 11:29 PM

## 2020-12-21 NOTE — Progress Notes (Signed)
Pharmacy Antibiotic Note  Shannon [name] is a 87 y.o. female admitted on 12/21/2020 with  wound infection .  Pharmacy has been consulted for Zosyn dosing.  WBC 4.2, afebrile SCr 1.65 - baseline appears to be 1.2-1.4  Plan: Zosyn 3.375 gm IV every 12 hours (4 hour infusion) due to CrCl 18.7 mL/min Monitor renal function, CBC, cultures/sensitivities, LOT and de-escalate as able  Temp (24hrs), Avg:99 F (37.2 C), Min:98.9 F (37.2 C), Max:99.1 F (37.3 C)  Recent Labs  Lab 12/20/20 1443 12/21/20 1030  WBC 6.0 4.2  CREATININE 1.68* 1.65*  LATICACIDVEN  --  0.7    Estimated Creatinine Clearance: 18.7 mL/min (A) (by C-G formula based on SCr of 1.65 mg/dL (H)).    Allergies  Allergen Reactions   Penicillins Itching, Rash and Other (See Comments)    Tolerated amoxicillin, unasyn, zosyn & cephalosporins in the past. Did it involve swelling of the face/tongue/throat, SOB, or low BP? No Did it involve sudden or severe rash/hives, skin peeling, or any reaction on the inside of your mouth or nose? No Did you need to seek medical attention at a hospital or doctor's office? Unknown When did it last happen?      5+ years If all above answers are "NO", may proceed with cephalosporin use.     Antimicrobials this admission: Zosyn 9/20 >>   Dose adjustments this admission: Zosyn 3.375 gm every 12h hours for CrCl 18.7 mL/min  Microbiology results: 9/20 BCx:  9/20 MRSA PCR:  Thank you for allowing pharmacy to be a part of this patient's care.  Laurey Arrow, PharmD PGY1 Pharmacy Resident 12/21/2020  9:06 PM  Please check [email or web address] for unit-specific pharmacy phone numbers.

## 2020-12-21 NOTE — ED Notes (Signed)
Pt not present when called for vital signs

## 2020-12-21 NOTE — ED Provider Notes (Signed)
Emergency Medicine Provider Triage Evaluation Note  Shannon [name] , a 87 y.o. female  was evaluated in triage.  Pt complains of pain in her bilat feet. Hx of diabetes. PCP ordered MRI to evaluate for osteomyelitis, but pt had to reschedule and is with pain. Has been taking abx.   Review of Systems  Positive: pain Negative: SOB, CP  Physical Exam  BP (!) 152/55 (BP Location: Left Arm)   Pulse 77   Temp 99.1 F (37.3 C) (Oral)   Resp 14   SpO2 100%  Gen:   Awake, no distress   Resp:  Normal effort  MSK:   Moves extremities without difficulty  Other:  Foot with dressing and infection. Patient reports numbness  Medical Decision Making  Medically screening exam initiated at 10:26 AM.  Appropriate orders placed.  Shannon [name] was informed that the remainder of the evaluation will be completed by another provider, this initial triage assessment does not replace that evaluation, and the importance of remaining in the ED until their evaluation is complete.  Difficult to find pulses, doppler needed    Rhae Hammock, PA-C 12/21/20 1044    Wyvonnia Dusky, MD 12/21/20 1100

## 2020-12-21 NOTE — ED Notes (Signed)
Pt called for vital signs.  Stated in MRI

## 2020-12-21 NOTE — ED Notes (Addendum)
Pt resting on stretcher watching TV. No acute changes noted. Will continue to monitor.

## 2020-12-21 NOTE — ED Triage Notes (Signed)
Pt arrived by EMS from home, daughter states that pt was placed on 2 new antibiotics yesterday and today when she woke up she was weak and lethargic  Pt has a feeding tube did not have her feed this morning  Pt does not have her hearing aids with her and is hard of hearing

## 2020-12-22 DIAGNOSIS — M86671 Other chronic osteomyelitis, right ankle and foot: Secondary | ICD-10-CM

## 2020-12-22 DIAGNOSIS — M86172 Other acute osteomyelitis, left ankle and foot: Secondary | ICD-10-CM | POA: Diagnosis not present

## 2020-12-22 DIAGNOSIS — M86672 Other chronic osteomyelitis, left ankle and foot: Secondary | ICD-10-CM

## 2020-12-22 LAB — BASIC METABOLIC PANEL
Anion gap: 11 (ref 5–15)
BUN: 59 mg/dL — ABNORMAL HIGH (ref 8–23)
CO2: 21 mmol/L — ABNORMAL LOW (ref 22–32)
Calcium: 10 mg/dL (ref 8.9–10.3)
Chloride: 106 mmol/L (ref 98–111)
Creatinine, Ser: 1.49 mg/dL — ABNORMAL HIGH (ref 0.44–1.00)
GFR, Estimated: 32 mL/min — ABNORMAL LOW (ref >60)
Glucose, Bld: 70 mg/dL (ref 70–99)
Potassium: 5.1 mmol/L (ref 3.5–5.1)
Sodium: 138 mmol/L (ref 135–145)

## 2020-12-22 LAB — CBG MONITORING, ED
Glucose-Capillary: 79 mg/dL (ref 70–99)
Glucose-Capillary: 74 mg/dL (ref 70–99)
Glucose-Capillary: 115 mg/dL — ABNORMAL HIGH (ref 70–99)
Glucose-Capillary: 143 mg/dL — ABNORMAL HIGH (ref 70–99)

## 2020-12-22 LAB — CBC
HCT: 29.3 % — ABNORMAL LOW (ref 36.0–46.0)
Hemoglobin: 9.8 g/dL — ABNORMAL LOW (ref 12.0–15.0)
MCH: 32.7 pg (ref 26.0–34.0)
MCHC: 33.4 g/dL (ref 30.0–36.0)
MCV: 97.7 fL (ref 80.0–100.0)
Platelets: 196 10*3/uL (ref 150–400)
RBC: 3 MIL/uL — ABNORMAL LOW (ref 3.87–5.11)
RDW: 17.9 % — ABNORMAL HIGH (ref 11.5–15.5)
WBC: 4.4 10*3/uL (ref 4.0–10.5)
nRBC: 0 % (ref 0.0–0.2)

## 2020-12-22 LAB — MRSA NEXT GEN BY PCR, NASAL: MRSA by PCR Next Gen: NOT DETECTED

## 2020-12-22 LAB — C-REACTIVE PROTEIN: CRP: 1.7 mg/dL — ABNORMAL HIGH (ref <1.0)

## 2020-12-22 LAB — PREALBUMIN: Prealbumin: 19.7 mg/dL (ref 18–38)

## 2020-12-22 LAB — SEDIMENTATION RATE: Sed Rate: 56 mm/hr — ABNORMAL HIGH (ref 0–22)

## 2020-12-22 MED ORDER — POVIDONE-IODINE 10 % EX OINT
TOPICAL_OINTMENT | Freq: Every day | CUTANEOUS | Status: AC
  Administered 2020-12-22: 1 via TOPICAL
  Filled 2020-12-22 (×2): qty 28.35

## 2020-12-22 MED ORDER — ACETAMINOPHEN 500 MG PO TABS
1000.0000 mg | ORAL_TABLET | Freq: Three times a day (TID) | ORAL | Status: AC
Start: 2020-12-22 — End: 2020-12-25
  Administered 2020-12-22 – 2020-12-25 (×8): 1000 mg via ORAL
  Filled 2020-12-22 (×8): qty 2

## 2020-12-22 MED ORDER — ACETAMINOPHEN 325 MG PO TABS: 650.0000 mg | ORAL_TABLET | Freq: Four times a day (QID) | ORAL | Status: AC | PRN

## 2020-12-22 NOTE — Progress Notes (Addendum)
PROGRESS NOTE    Shannon [name]  KGU:[id number] DOB: 1923/07/24 DOA: 12/21/2020 PCP: Sharon Seller, NP   No chief complaint on file.   Brief Narrative:  Shannon [name] is Shannon [name] 87 y.o. female with medical history significant of DM2, HTN, CKD 4, gastric stenosis s/p feeding tube.  She presented to the hospital after an episode of unresponsiveness.  She was brought to hospital by EMS.  She was complaining of bilateral foot pain.    She's being seen for bilateral lower extremity osteo.   Assessment & Plan:   Principal Problem:   Osteomyelitis of left foot (HCC) Active Problems:   Type 2 diabetes mellitus with chronic kidney disease (HCC)   Accelerated hypertension   Acute renal failure superimposed on stage 4 chronic kidney disease (HCC)   Chronic diastolic CHF (congestive heart failure) (HCC)   Osteomyelitis of right foot (HCC)   Diabetic infection of right foot (HCC)   Diabetic infection of left foot (HCC)  Bilateral Diabetic Foot Infections  Osteomyelitis MRI R foot notable for acute osteo of 3rd metatarsal, bone marrow edema at resection margin of 5th metatarsal, diffuse soft tissue swelling at the distal stump as well as the dorsum of the forefoot.   MRI L foot notable for dislocation and hallux valgus at the first MTP joint with boy erosion particularaly in the head of the first metatarsal medially, but also in the base of the proximal phalanx, with marrow edema in both of the structures - concerning for osteo and possible septic joint.  Transverse fx of head of 3rd metatarsa, ? Superimposed osteo.   R foot ulcer with enterococcus 8 days ago L foot ulcer with pseudomonas 3 months ago Follow [facility]Is Infectious disease consult, appreciate recs - recommending surgical debridement - recommending holding abx for now Podiatry c/s, appreciate recs  PAD S/p L popliteal angioplasty 10/20/20 At that point, recommending aspirin indefinitely, plavix x3 months.  Statin indefinitely.   AKI  on CKD IIIB Baseline creatinine appears to be around 1.45 1.68 at presentation Follow with IVF  HTN Continue home BP meds  T2DM A1c 5.5 last month Diet controlled Continue BG check  HFpEF Holding lasix given aki on presentation  Gastric stenosis s/p feeding tube Continue tube feeds   DVT prophylaxis: heparin Code Status: DNR Family Communication: none at bedside - daughter over phone Disposition:   Status is: Inpatient  Remains inpatient appropriate because:Inpatient level of care appropriate due to severity of illness  Dispo: The patient is from: Home              Anticipated d/c is to:  pending              Patient currently is not medically stable to d/c.   Difficult to place patient No       Consultants:  ID podiatry  Procedures:  none  Antimicrobials:  Anti-infectives (From admission, onward)    Start     Dose/Rate Route Frequency Ordered Stop   12/21/20 2200  piperacillin-tazobactam (ZOSYN) IVPB 3.375 g  Status:  Discontinued        3.375 g 12.5 mL/hr over 240 Minutes Intravenous Every 12 hours 12/21/20 2105 12/22/20 1157   12/21/20 2030  vancomycin (VANCOREADY) IVPB 1250 mg/250 mL  Status:  Discontinued        1,250 mg 166.7 mL/hr over 90 Minutes Intravenous  Once 12/21/20 2022 12/21/20 2035          Subjective: No complaints Notes she was told  to come here by podiatry  Objective: Vitals:   12/22/20 1049 12/22/20 1300 12/22/20 1327 12/22/20 1500  BP: (!) 179/66 (!) 139/59  (!) 145/95  Pulse:  73  73  Resp:  17  15  Temp:   98.6 F (37 C)   TempSrc:   Oral   SpO2:  99%  99%    Intake/Output Summary (Last 24 hours) at 12/22/2020 1507 Last data filed at 12/22/2020 0149 Gross per 24 hour  Intake 50 ml  Output --  Net 50 ml   There were no vitals filed for this visit.  Examination:  General exam: Appears calm and comfortable  Respiratory system: unlabored Cardiovascular system:RRR Gastrointestinal system: Abdomen is  nondistended, soft and nontender.  Central nervous system: Alert and oriented. No focal neurological deficits. Extremities: bilateral LE with intact dressings - images reviewed Psychiatry: Judgement and insight appear normal. Mood & affect appropriate.     Data Reviewed: I have personally reviewed following labs and imaging studies  CBC: Recent Labs  Lab 12/20/20 1443 12/21/20 1030 12/22/20 0340  WBC 6.0 4.2 4.4  NEUTROABS  --  2.5  --   HGB 9.5* 9.2* 9.8*  HCT 28.9* 28.0* 29.3*  MCV 97.6 96.9 97.7  PLT 232 194 196    Basic Metabolic Panel: Recent Labs  Lab 12/20/20 1443 12/21/20 1030 12/22/20 0340  NA 137 136 138  K 5.1 4.5 5.1  CL 101 102 106  CO2 27 24 21*  GLUCOSE 87 122* 70  BUN 73* 70* 59*  CREATININE 1.68* 1.65* 1.49*  CALCIUM 9.9 9.4 10.0    GFR: Estimated Creatinine Clearance: 20.7 mL/min (Shannon [name]) (by C-G formula based on SCr of 1.49 mg/dL (H)).  Liver Function Tests: Recent Labs  Lab 12/21/20 1030  AST 20  ALT 14  ALKPHOS 76  BILITOT 0.4  PROT 6.9  ALBUMIN 3.0*    CBG: Recent Labs  Lab 12/22/20 0103 12/22/20 0808 12/22/20 1320  GLUCAP 79 74 115*     Recent Results (from the past 240 hour(s))  WOUND CULTURE     Status: Abnormal   Collection Time: 12/13/20  3:00 PM  Result Value Ref Range Status   MICRO NUMBER: [id number]  Final   SPECIMEN QUALITY: Adequate  Final   SOURCE: RT FOOT ULCER  Final   STATUS: FINAL  Final   GRAM STAIN:   Final    No epithelial cells seen No white blood cells seen Many Gram negative bacilli   ISOLATE 1: Enterococcus faecalis (Shannon [name])  Final    Comment: Heavy growth of Enterococcus faecalis      Susceptibility   Enterococcus faecalis - AEROBIC CULT, GRAM STAIN POSITIVE 1    AMPICILLIN <=2 Sensitive     VANCOMYCIN* 1 Sensitive      * Legend: S = Susceptible  I = Intermediate R = Resistant  NS = Not susceptible * = Not tested  NR = Not reported **NN = See antimicrobic comments   Blood culture (routine x 2)      Status: None (Preliminary result)   Collection Time: 12/21/20 10:29 AM   Specimen: BLOOD  Result Value Ref Range Status   Specimen Description BLOOD RIGHT ANTECUBITAL  Final   Special Requests   Final    BOTTLES DRAWN AEROBIC ONLY Blood Culture results may not be optimal due to an inadequate volume of blood received in culture bottles   Culture   Final    NO GROWTH 1 DAY Performed at [facility], [address] [place], [place] [address]    Report Status PENDING  Incomplete  Blood culture (routine x 2)     Status: None (Preliminary result)   Collection Time: 12/21/20 10:34 AM   Specimen: BLOOD  Result Value Ref Range Status   Specimen Description BLOOD SITE NOT SPECIFIED  Final   Special Requests   Final    BOTTLES DRAWN AEROBIC ONLY Blood Culture results may not be optimal due to an inadequate volume of blood received in culture bottles   Culture   Final    NO GROWTH < 24 HOURS Performed at [facility], [address], [place], [place] [address]    Report Status PENDING  Incomplete  Resp Panel by RT-PCR (Flu Shannon [name]&B, Covid) Urine, Clean Catch     Status: None   Collection Time: 12/21/20  8:34 PM   Specimen: Urine, Clean Catch; Nasopharyngeal(NP) swabs in vial transport medium  Result Value Ref Range Status   SARS Coronavirus 2 by RT PCR NEGATIVE NEGATIVE Final    Comment: (NOTE) SARS-CoV-2 target nucleic acids are NOT DETECTED.  The SARS-CoV-2 RNA is generally detectable in upper respiratory specimens during the acute phase of infection. The lowest concentration of SARS-CoV-2 viral copies this assay can detect is 138 copies/mL. Shannon [name] negative result does not preclude SARS-Cov-2 infection and should not be used as the sole basis for treatment or other patient management decisions. Shannon [name] negative result may occur with  improper specimen collection/handling, submission of specimen other than nasopharyngeal swab, presence of viral mutation(s) within the areas targeted by this  assay, and inadequate number of viral copies(<138 copies/mL). Shannon [name] negative result must be combined with clinical observations, patient history, and epidemiological information. The expected result is Negative.  Fact Sheet for Patients:  BloggerCourse.com  Fact Sheet for Healthcare Providers:  SeriousBroker.it  This test is no t yet approved or cleared by the [place] FDA and  has been authorized for detection and/or diagnosis of SARS-CoV-2 by FDA under an Emergency Use Authorization (EUA). This EUA will remain  in effect (meaning this test can be used) for the duration of the COVID-19 declaration under Section 564(b)(1) of the Act, 21 U.S.C.section 360bbb-3(b)(1), unless the authorization is terminated  or revoked sooner.       Influenza Shannon [name] by PCR NEGATIVE NEGATIVE Final   Influenza B by PCR NEGATIVE NEGATIVE Final    Comment: (NOTE) The Xpert Xpress SARS-CoV-2/FLU/RSV plus assay is intended as an aid in the diagnosis of influenza from Nasopharyngeal swab specimens and should not be used as Yuniel Blaney sole basis for treatment. Nasal washings and aspirates are unacceptable for Xpert Xpress SARS-CoV-2/FLU/RSV testing.  Fact Sheet for Patients: BloggerCourse.com  Fact Sheet for Healthcare Providers: SeriousBroker.it  This test is not yet approved or cleared by the [place] FDA and has been authorized for detection and/or diagnosis of SARS-CoV-2 by FDA under an Emergency Use Authorization (EUA). This EUA will remain in effect (meaning this test can be used) for the duration of the COVID-19 declaration under Section 564(b)(1) of the Act, 21 U.S.C. section 360bbb-3(b)(1), unless the authorization is terminated or revoked.  Performed at [facility], [address], [place], [place] [address]   MRSA Next Gen by PCR, Nasal     Status: None   Collection Time: 12/22/20  1:00 AM    Specimen: Nasal Mucosa; Nasal Swab  Result Value Ref Range Status   MRSA by PCR Next Gen NOT DETECTED NOT DETECTED Final  Comment: (NOTE) The GeneXpert MRSA Assay (FDA approved for NASAL specimens only), is one component of Danniel Grenz comprehensive MRSA colonization surveillance program. It is not intended to diagnose MRSA infection nor to guide or monitor treatment for MRSA infections. Test performance is not FDA approved in patients less than 40 years old. Performed at [facility], [address] [place], [place] [address]          Radiology Studies: MR FOOT RIGHT WO CONTRAST  Result Date: 12/21/2020 CLINICAL DATA:  Foot pain, chronic, osteoarthritis suspected EXAM: MRI OF THE RIGHT FOREFOOT WITHOUT CONTRAST TECHNIQUE: Multiplanar, multisequence MR imaging of the right forefoot was performed. No intravenous contrast was administered. 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 Destructive changes at the third metatarsal resection margin with prominent bone marrow edema extending to the level of the third metatarsal proximal metaphysis. Intermediate to low T1 signal to the level of the mid third metatarsal diaphysis, compatible with acute osteomyelitis. There is bone marrow edema at the resection margin at the level of the fifth metatarsal diaphysis concerning for early acute osteomyelitis. The residual first, second, and fourth metatarsals are intact without evidence to suggest acute osteomyelitis at this time. Bony structures of the midfoot and visualized hindfoot remain intact. No fractures. No malalignment. Ligaments Intact Lisfranc ligament. Muscles and Tendons Chronic denervation changes of the intrinsic foot musculature. Diffuse intramuscular edema-like signal may reflect myositis and/or denervation. Post amputation changes to the musculotendinous structures of the right forefoot.  Soft tissues Diffuse soft tissue swelling at the distal stump as well as at the dorsum of the forefoot. Superficial ulceration at the distal stump distal to the level of the third metatarsal. No organized or drainable fluid collections. IMPRESSION: 1. Acute osteomyelitis of the right third metatarsal. 2. Bone marrow edema at the resection margin of the fifth metatarsal concerning for early acute osteomyelitis. 3. Diffuse soft tissue swelling at the distal stump as well as at the dorsum of the forefoot. Superficial ulceration at the distal stump distal to the level of the third metatarsal. No organized or drainable fluid collections. Electronically Signed   By: Shannon [name] D.O.   On: 12/21/2020 14:25   MR FOOT LEFT WO CONTRAST  Result Date: 12/21/2020 CLINICAL DATA:  Chronic foot pain EXAM: MRI OF THE LEFT FOOT WITHOUT CONTRAST TECHNIQUE: Multiplanar, multisequence MR imaging of the left forefoot was performed. No intravenous contrast was administered. COMPARISON:  Radiographs from 12/13/2020 FINDINGS: Bones/Joint/Cartilage The second and third toes are absent past the distal metatarsal level. There is lateral dislocation of the base of the proximal phalanx of the great toe with respect to the first metatarsal head, with erosion and potentially bony destruction of Shannon [name] substantial portion of the first metatarsal head medially and extensive marrow edema in the distal half of the first metatarsal. There is also angulation at the first MTP joint compatible with hallux valgus. There is likewise marrow edema in the proximal phalanx great toe and in the adjacent sesamoids, with erosion of the medial base of the proximal phalanx. Transverse fracture of the distal head of the fourth metacarpal. Notable surrounding edema tracking all the way back into the base of the fourth metacarpal. Shannon [name] component of superimposed osteomyelitis is not excluded. Faint edema anteriorly in the calcaneus on image 13 series 8, nonspecific.  Ligaments The Lisfranc ligament appears intact. Given the dislocation, ligamentous structures at the first MTP joint are thought to be insufficient.  Muscles and Tendons Muscular atrophy in the forefoot. Soft tissues Subcutaneous edema in the soft tissues surrounding the first MTP joint, possibly cellulitis. IMPRESSION: 1. Dislocation and hallux valgus at the first MTP joint, with bony erosions particularly in the head of the first metatarsal medially, but also in the base of the proximal phalanx, with marrow edema in both of the structures. Although gout can sometimes cause Shannon [name] similar arthropathy, the appearance is concerning for osteomyelitis and possibly septic joint. 2. Transverse fracture of the head of the third metatarsal. The degree of edema throughout the bone is somewhat greater than I would expect for transverse fracture, and raise the possibility of superimposed osteomyelitis. 3. The second and third digits appear amputated at the distal metatarsal level. Electronically Signed   By: Shannon [name] M.D.   On: 12/21/2020 14:30   DG Foot Complete Left  Result Date: 12/21/2020 Please see detailed radiograph report in office note.  DG Foot Complete Right  Result Date: 12/21/2020 Please see detailed radiograph report in office note.       Scheduled Meds:  amLODipine  5 mg Oral Daily   artificial tears  1 application Both Eyes QHS   aspirin EC  81 mg Oral Daily   atorvastatin  10 mg Oral Daily   clopidogrel  75 mg Oral Daily   cycloSPORINE  1 drop Both Eyes BID   DULoxetine  30 mg Oral Daily   feeding supplement (OSMOLITE 1.5 CAL)  65 mL/hr Per Tube Q24H   ferrous sulfate  325 mg Oral Daily   fluticasone  2 spray Each Nare Daily   free water  180 mL Per Tube TID   gabapentin  300 mg Oral BID   heparin  5,000 Units Subcutaneous Q8H   hydrALAZINE  50 mg Oral TID   isosorbide mononitrate  60 mg Oral Daily   latanoprost  1 drop Both Eyes QHS   levothyroxine  125 mcg Oral QAC  breakfast   loratadine  10 mg Oral Daily   mometasone-formoterol  2 puff Inhalation BID   multivitamin with minerals  1 tablet Oral Daily   nystatin cream  1 application Topical BID   pantoprazole  40 mg Oral BID   povidone-iodine   Topical Q1400   senna-docusate  1 tablet Oral QHS   Continuous Infusions:  sodium chloride 100 mL/hr at 12/22/20 0635     LOS: 1 day    Time spent: over 30 min    Lacretia Nicks, MD [facility]   To contact the attending provider between 7A-7P or the covering provider during after hours 7P-7A, please log into the web site [email or web address] and access using universal [place] password for that web site. If you do not have the password, please call the hospital operator.  12/22/2020, 3:07 PM

## 2020-12-22 NOTE — Consult Note (Signed)
Reason for Consult: Infection  Referring Physician: Dr. Fayrene Helper, MD  Shannon [name] is an 87 y.o. female.  HPI: 87 y.o. female with medical history significant of DM2, HTN, CKD 4, gastric stenosis s/p feeding tube.  She presented to the hospital after an episode of unresponsiveness.  She was brought to hospital by EMS.  She was complaining of bilateral foot pain.    Patient had a longstanding history of ulcerations with a history of right transmetatarsal potation.  She is under the care of Dr. Amalia Hailey as well as infectious disease who she saw the day before coming into the hospital or antibiotics have been changed.  She has been previously been evaluated by vascular surgery.  Per Dr. Luan Pulling from angio on October 20, 2020:  Good technical result from angiography.  Disadvantaged pedal circulation will make wound healing difficult.  Recommend aspirin 81 mg p.o. indefinitely.  Recommend Plavix 75 mg p.o. x3 months.  Recommend high intensity statin therapy indefinitely.   Upon entering her room in the emergency department she was disappointed in her MRI report she verbalized.  Past Medical History:  Diagnosis Date   Acute respiratory failure ([place])    Anemia    previous blood transfusions   Arthritis    "all over"   Asthma    Atrial flutter (HCC)    Bradycardia    requiring previous d/c of BB and reduction of amiodarone   CAD (coronary artery disease)    nonobstructive per notes   Chronic diastolic CHF (congestive heart failure) ([place])    CKD (chronic kidney disease), stage III ([place])    Complication of blood transfusion    "got the wrong blood type at [facility] in ~ 2015; no adverse reaction that we are aware of"/daughter, Shannon [name] (01/27/2016)   COPD (chronic obstructive pulmonary disease) ([place])    Depression    "light case"   DVT (deep venous thrombosis) ([place]) 01/2016   a. LLE DVT 01/2016 - switched from Eliquis to Coumadin.   Dyspnea    with some activity   Gastric stenosis    a.  s/p stomach tube   GERD (gastroesophageal reflux disease)    Headache    History of 2019 novel coronavirus disease (COVID-19)    History of blood transfusion    "several" (01/27/2016)   History of stomach ulcers    Hyperlipidemia    Hypertension    Hypothyroidism    On home oxygen therapy    "2L; 9PM til 9AM" (06/27/2017)   PAD (peripheral artery disease) (HCC)    a. prior gangrene, toe amputations, intervention.   PAF (paroxysmal atrial fibrillation) ([place])    Paraesophageal hernia    Perforated gastric ulcer ([place])    Pneumonia    "a few times" (06/27/2017)   Seasonal allergies    SIADH (syndrome of inappropriate ADH production) ([place])    [name] 01/10/2015   Small bowel obstruction ([place])    "I don't know how many" (01/11/2015)   Stroke ([place])    several- no residual   Type II diabetes mellitus ([place])    "related to prednisone use  for > 20 yr; once predinose stopped; no more DM RX" (01/27/2016)   UTI (urinary tract infection) 02/08/2016   Ventral hernia with bowel obstruction     Past Surgical History:  Procedure Laterality Date   ABDOMINAL AORTOGRAM N/A 09/21/2016   Procedure: Abdominal Aortogram;  Surgeon: Waynetta Sandy, MD;  Location: [facility];  Service: Cardiovascular;  Laterality: N/A;  ABDOMINAL AORTOGRAM W/LOWER EXTREMITY N/A 10/20/2020   Procedure: ABDOMINAL AORTOGRAM W/LOWER EXTREMITY;  Surgeon: Cherre Robins, MD;  Location: [facility];  Service: Cardiovascular;  Laterality: N/A;   AMPUTATION Left 09/25/2016   Procedure: AMPUTATION DIGIT- LEFT 2ND AND 3RD TOES;  Surgeon: Rosetta Posner, MD;  Location: [facility];  Service: Vascular;  Laterality: Left;   AMPUTATION Right 05/03/2018   Procedure: AMPUTATION RIGHT GREAT TOE;  Surgeon: Rosetta Posner, MD;  [facility];  Service: Vascular;  Laterality: Right;   CATARACT EXTRACTION W/ INTRAOCULAR LENS  IMPLANT, BILATERAL     CHOLECYSTECTOMY OPEN     COLECTOMY     ESOPHAGOGASTRODUODENOSCOPY N/A  01/19/2014   Procedure: ESOPHAGOGASTRODUODENOSCOPY (EGD);  Surgeon: Irene Shipper, MD;  Location[facility];  Service: Endoscopy;  Laterality: N/A;   ESOPHAGOGASTRODUODENOSCOPY N/A 01/20/2014   Procedure: ESOPHAGOGASTRODUODENOSCOPY (EGD);  Surgeon: Irene Shipper, MD;  Location: [facility];  Service: Endoscopy;  Laterality: N/A;   ESOPHAGOGASTRODUODENOSCOPY N/A 03/19/2014   Procedure: ESOPHAGOGASTRODUODENOSCOPY (EGD);  Surgeon: Milus Banister, MD;  Location: [facility];  Service: Endoscopy;  Laterality: N/A;   ESOPHAGOGASTRODUODENOSCOPY N/A 07/08/2015   Procedure: ESOPHAGOGASTRODUODENOSCOPY (EGD);  Surgeon: Doran Stabler, MD;  Location: [facility];  Service: Endoscopy;  Laterality: N/A;   ESOPHAGOGASTRODUODENOSCOPY (EGD) WITH PROPOFOL N/A 09/15/2015   Procedure: ESOPHAGOGASTRODUODENOSCOPY (EGD) WITH PROPOFOL;  Surgeon: Manus Gunning, MD;  [facility];  Service: Gastroenterology;  Laterality: N/A;   ESOPHAGOGASTRODUODENOSCOPY (EGD) WITH PROPOFOL N/A 10/12/2020   Procedure: ESOPHAGOGASTRODUODENOSCOPY (EGD) WITH PROPOFOL;  Surgeon: Gatha Mayer, MD;  Location: [facility];  Service: Endoscopy;  Laterality: N/A;   GASTROJEJUNOSTOMY     hx/notes 01/10/2015   GASTROJEJUNOSTOMY N/A 09/23/2015   Procedure: OPEN GASTROJEJUNOSTOMY TUBE PLACEMENT;  Surgeon: Arta Bruce Kinsinger, MD;  [facility];  Service: General;  Laterality: N/A;   GLAUCOMA SURGERY Bilateral    HEMATOMA EVACUATION Right 10/20/2020   Procedure: EVACUATION HEMATOMA RIGHT GROIN;  Surgeon: Cherre Robins, MD;  Location: [facility];  Service: Vascular;  Laterality: Right;   IR CM INJ ANY COLONIC TUBE W/FLUORO  01/05/2017   IR CM INJ ANY COLONIC TUBE W/FLUORO  06/07/2017   IR CM INJ ANY COLONIC TUBE W/FLUORO  11/05/2017   IR CM INJ ANY COLONIC TUBE W/FLUORO  09/18/2018   IR CM INJ ANY COLONIC TUBE W/FLUORO  03/06/2019   IR CM INJ ANY COLONIC TUBE W/FLUORO  03/11/2019   IR GASTR TUBE CONVERT GASTR-JEJ PER  W/FL MOD SED  04/30/2020   IR GENERIC HISTORICAL  01/07/2016   IR [place] TUBE CHANGE 01/07/2016 Jacqulynn Cadet, MD [facility]   IR GENERIC HISTORICAL  01/27/2016   IR MECH REMOV OBSTRUC MAT ANY COLON TUBE W/FLUORO 01/27/2016 Markus Daft, MD [facility]   IR GENERIC HISTORICAL  02/07/2016   IR PATIENT EVAL TECH 0-60 MINS Darrell K Allred, PA-C [facility]   IR GENERIC HISTORICAL  02/08/2016   IR [place] TUBE CHANGE 02/08/2016 Greggory Keen, MD [facility]   IR GENERIC HISTORICAL  01/06/2016   IR [place] TUBE CHANGE 01/06/2016 [facility] OUT REF   IR GENERIC HISTORICAL  05/02/2016   IR CM INJ ANY COLONIC TUBE W/FLUORO 05/02/2016 Arne Cleveland, MD [facility]   IR GENERIC HISTORICAL  05/15/2016   IR [place] TUBE CHANGE 05/15/2016 Sandi Mariscal, MD [facility]   IR GENERIC HISTORICAL  06/28/2016   IR [place] TUBE CHANGE 06/28/2016 [facility]   IR [place] TUBE CHANGE  02/20/2017  IR GJ TUBE CHANGE  05/10/2017   IR GJ TUBE CHANGE  07/06/2017   IR GJ TUBE CHANGE  08/02/2017   IR GJ TUBE CHANGE  10/15/2017   IR GJ TUBE CHANGE  12/26/2017   IR GJ TUBE CHANGE  04/08/2018   IR GJ TUBE CHANGE  06/19/2018   IR GJ TUBE CHANGE  03/03/2019   IR GJ TUBE CHANGE  06/06/2019   IR GJ TUBE CHANGE  09/26/2019   IR GJ TUBE CHANGE  01/09/2020   IR GJ TUBE CHANGE  05/12/2020   IR GJ TUBE CHANGE  10/05/2020   IR GJ TUBE CHANGE  10/14/2020   IR GJ TUBE CHANGE  10/19/2020   IR PATIENT EVAL TECH 0-60 MINS  10/19/2016   IR PATIENT EVAL TECH 0-60 MINS  12/25/2016   IR PATIENT EVAL TECH 0-60 MINS  01/29/2017   IR PATIENT EVAL TECH 0-60 MINS  04/04/2017   IR PATIENT EVAL TECH 0-60 MINS  04/30/2017   IR PATIENT EVAL TECH 0-60 MINS  07/31/2017   IR REPLC DUODEN/JEJUNO TUBE PERCUT W/FLUORO  11/14/2016   LAPAROTOMY N/A 01/20/2015   Procedure: EXPLORATORY LAPAROTOMY;  Surgeon: Coralie Keens, MD;  Location: [facility];  Service: General;  Laterality: N/A;   LOWER EXTREMITY ANGIOGRAPHY Left 09/21/2016   Procedure: Lower Extremity  Angiography;  Surgeon: Waynetta Sandy, MD;  Location: [facility];  Service: Cardiovascular;  Laterality: Left;   LOWER EXTREMITY ANGIOGRAPHY Right 06/27/2017   Procedure: Lower Extremity Angiography;  Surgeon: Waynetta Sandy, MD;  Location: [facility];  Service: Cardiovascular;  Laterality: Right;   LYSIS OF ADHESION N/A 01/20/2015   Procedure: LYSIS OF ADHESIONS < 1 HOUR;  Surgeon: Coralie Keens, MD;  Location: [facility];  Service: General;  Laterality: N/A;   PERIPHERAL VASCULAR BALLOON ANGIOPLASTY Left 09/21/2016   Procedure: Peripheral Vascular Balloon Angioplasty;  Surgeon: Waynetta Sandy, MD;  Location: [facility];  Service: Cardiovascular;  Laterality: Left;  drug coated balloon   PERIPHERAL VASCULAR BALLOON ANGIOPLASTY Right 06/27/2017   Procedure: PERIPHERAL VASCULAR BALLOON ANGIOPLASTY;  Surgeon: Waynetta Sandy, MD;  Location: [facility];  Service: Cardiovascular;  Laterality: Right;  SFA/TPTRUNK   PERIPHERAL VASCULAR INTERVENTION Left 10/20/2020   Procedure: PERIPHERAL VASCULAR INTERVENTION;  Surgeon: Cherre Robins, MD;  Location: [facility];  Service: Cardiovascular;  Laterality: Left;   TONSILLECTOMY     TRANSMETATARSAL AMPUTATION Right 11/14/2019   Procedure: TRANSMETATARSAL AMPUTATION RIGHT FOOT;  Surgeon: Edrick Kins, DPM;  Location: [facility];  Service: Podiatry;  Laterality: Right;   TUBAL LIGATION     VENTRAL HERNIA REPAIR  2015   incarcerated ventral hernia ([facility] 09/2013)/notes 01/10/2015   WOUND EXPLORATION Right 10/20/2020   Procedure: PRIMARY CLOSURE COMMON FEMORAL ARTERY;  Surgeon: Cherre Robins, MD;  Location[facility];  Service: Vascular;  Laterality: Right;    Family History  Problem Relation Age of Onset   Stroke Mother    Hypertension Mother    Diabetes Brother    Glaucoma Son    Glaucoma Son    Heart attack Neg Hx    Colon cancer Neg Hx    Stomach cancer Neg Hx     Social  History:  reports that she has never smoked. She quit smokeless tobacco use about 40 years ago.  Her smokeless tobacco use included snuff. She reports that she does not drink alcohol and does not use drugs.  Allergies:  Allergies  Allergen  Reactions   Penicillins Itching, Rash and Other (See Comments)    Tolerated amoxicillin, unasyn, zosyn & cephalosporins in the past. Did it involve swelling of the face/tongue/throat, SOB, or low BP? No Did it involve sudden or severe rash/hives, skin peeling, or any reaction on the inside of your mouth or nose? No Did you need to seek medical attention at a hospital or doctor's office? Unknown When did it last happen?      5+ years If all above answers are "NO", may proceed with cephalosporin use.     Medications: I have reviewed the patient's current medications.  Results for orders placed or performed during the hospital encounter of 12/21/20 (from the past 48 hour(s))  Blood culture (routine x 2)     Status: None (Preliminary result)   Collection Time: 12/21/20 10:29 AM   Specimen: BLOOD  Result Value Ref Range   Specimen Description BLOOD RIGHT ANTECUBITAL    Special Requests      BOTTLES DRAWN AEROBIC ONLY Blood Culture results may not be optimal due to an inadequate volume of blood received in culture bottles   Culture      NO GROWTH 1 DAY Performed at [facility] [address] [place], [place] [address]    Report Status PENDING   CBC with Differential     Status: Abnormal   Collection Time: 12/21/20 10:30 AM  Result Value Ref Range   WBC 4.2 4.0 - 10.5 K/uL   RBC 2.89 (L) 3.87 - 5.11 MIL/uL   Hemoglobin 9.2 (L) 12.0 - 15.0 g/dL   HCT 28.0 (L) 36.0 - 46.0 %   MCV 96.9 80.0 - 100.0 fL   MCH 31.8 26.0 - 34.0 pg   MCHC 32.9 30.0 - 36.0 g/dL   RDW 17.8 (H) 11.5 - 15.5 %   Platelets 194 150 - 400 K/uL   nRBC 0.0 0.0 - 0.2 %   Neutrophils Relative % 59 %   Neutro Abs 2.5 1.7 - 7.7 K/uL   Lymphocytes Relative 13 %   Lymphs  Abs 0.5 (L) 0.7 - 4.0 K/uL   Monocytes Relative 23 %   Monocytes Absolute 1.0 0.1 - 1.0 K/uL   Eosinophils Relative 4 %   Eosinophils Absolute 0.2 0.0 - 0.5 K/uL   Basophils Relative 1 %   Basophils Absolute 0.0 0.0 - 0.1 K/uL   Immature Granulocytes 0 %   Abs Immature Granulocytes 0.01 0.00 - 0.07 K/uL    Comment: Performed at [facility], [place] [address], [place], [place] [address]  Comprehensive metabolic panel     Status: Abnormal   Collection Time: 12/21/20 10:30 AM  Result Value Ref Range   Sodium 136 135 - 145 mmol/L   Potassium 4.5 3.5 - 5.1 mmol/L   Chloride 102 98 - 111 mmol/L   CO2 24 22 - 32 mmol/L   Glucose, Bld 122 (H) 70 - 99 mg/dL    Comment: Glucose reference range applies only to samples taken after fasting for at least 8 hours.   BUN 70 (H) 8 - 23 mg/dL   Creatinine, Ser 1.65 (H) 0.44 - 1.00 mg/dL   Calcium 9.4 8.9 - 10.3 mg/dL   Total Protein 6.9 6.5 - 8.1 g/dL   Albumin 3.0 (L) 3.5 - 5.0 g/dL   AST 20 15 - 41 U/L   ALT 14 0 - 44 U/L   Alkaline Phosphatase 76 38 - 126 U/L   Total Bilirubin 0.4 0.3 -  1.2 mg/dL   GFR, Estimated 28 (L) >60 mL/min    Comment: (NOTE) Calculated using the CKD-EPI Creatinine Equation (2021)    Anion gap 10 5 - 15    Comment: Performed at [facility], [place] [address], [place], [place] [address]  Lactic acid, plasma     Status: None   Collection Time: 12/21/20 10:30 AM  Result Value Ref Range   Lactic Acid, Venous 0.7 0.5 - 1.9 mmol/L    Comment: Performed at [facility] [address] [place], [place] [address]  Blood culture (routine x 2)     Status: None (Preliminary result)   Collection Time: 12/21/20 10:34 AM   Specimen: BLOOD  Result Value Ref Range   Specimen Description BLOOD SITE NOT SPECIFIED    Special Requests      BOTTLES DRAWN AEROBIC ONLY Blood Culture results may not be optimal due to an inadequate volume of blood received in culture bottles   Culture      NO GROWTH < 24 HOURS Performed  at [facility] [address] [place], [place] [address]    Report Status PENDING   Urinalysis, Routine w reflex microscopic Urine, Clean Catch     Status: None   Collection Time: 12/21/20  8:34 PM  Result Value Ref Range   Color, Urine YELLOW YELLOW   APPearance CLEAR CLEAR   Specific Gravity, Urine 1.010 1.005 - 1.030   pH 7.5 5.0 - 8.0   Glucose, UA NEGATIVE NEGATIVE mg/dL   Hgb urine dipstick NEGATIVE NEGATIVE   Bilirubin Urine NEGATIVE NEGATIVE   Ketones, ur NEGATIVE NEGATIVE mg/dL   Protein, ur NEGATIVE NEGATIVE mg/dL   Nitrite NEGATIVE NEGATIVE   Leukocytes,Ua NEGATIVE NEGATIVE    Comment: Microscopic not done on urines with negative protein, blood, leukocytes, nitrite, or glucose < 500 mg/dL. Performed at [facility], [place] [address], [place], [place] [address]   Resp Panel by RT-PCR (Flu A&B, Covid) Urine, Clean Catch     Status: None   Collection Time: 12/21/20  8:34 PM   Specimen: Urine, Clean Catch; Nasopharyngeal(NP) swabs in vial transport medium  Result Value Ref Range   SARS Coronavirus 2 by RT PCR NEGATIVE NEGATIVE    Comment: (NOTE) SARS-CoV-2 target nucleic acids are NOT DETECTED.  The SARS-CoV-2 RNA is generally detectable in upper respiratory specimens during the acute phase of infection. The lowest concentration of SARS-CoV-2 viral copies this assay can detect is 138 copies/mL. A negative result does not preclude SARS-Cov-2 infection and should not be used as the sole basis for treatment or other patient management decisions. A negative result may occur with  improper specimen collection/handling, submission of specimen other than nasopharyngeal swab, presence of viral mutation(s) within the areas targeted by this assay, and inadequate number of viral copies(<138 copies/mL). A negative result must be combined with clinical observations, patient history, and epidemiological information. The expected result is Negative.  Fact Sheet for  Patients:  EntrepreneurPulse.com.au  Fact Sheet for Healthcare Providers:  IncredibleEmployment.be  This test is no t yet approved or cleared by the [place] FDA and  has been authorized for detection and/or diagnosis of SARS-CoV-2 by FDA under an Emergency Use Authorization (EUA). This EUA will remain  in effect (meaning this test can be used) for the duration of the COVID-19 declaration under Section 564(b)(1) of the Act, 21 U.S.C.section 360bbb-3(b)(1), unless the authorization is terminated  or revoked sooner.  Influenza A by PCR NEGATIVE NEGATIVE   Influenza B by PCR NEGATIVE NEGATIVE    Comment: (NOTE) The Xpert Xpress SARS-CoV-2/FLU/RSV plus assay is intended as an aid in the diagnosis of influenza from Nasopharyngeal swab specimens and should not be used as a sole basis for treatment. Nasal washings and aspirates are unacceptable for Xpert Xpress SARS-CoV-2/FLU/RSV testing.  Fact Sheet for Patients: EntrepreneurPulse.com.au  Fact Sheet for Healthcare Providers: IncredibleEmployment.be  This test is not yet approved or cleared by the [place] FDA and has been authorized for detection and/or diagnosis of SARS-CoV-2 by FDA under an Emergency Use Authorization (EUA). This EUA will remain in effect (meaning this test can be used) for the duration of the COVID-19 declaration under Section 564(b)(1) of the Act, 21 U.S.C. section 360bbb-3(b)(1), unless the authorization is terminated or revoked.  Performed at [facility], [place] [address], [place], [place] [address]   MRSA Next Gen by PCR, Nasal     Status: None   Collection Time: 12/22/20  1:00 AM   Specimen: Nasal Mucosa; Nasal Swab  Result Value Ref Range   MRSA by PCR Next Gen NOT DETECTED NOT DETECTED    Comment: (NOTE) The GeneXpert MRSA Assay (FDA approved for NASAL specimens only), is one component of a comprehensive MRSA  colonization surveillance program. It is not intended to diagnose MRSA infection nor to guide or monitor treatment for MRSA infections. Test performance is not FDA approved in patients less than 30 years old. Performed at [facility], [place] [address] [place], [place] [address]   Sedimentation rate     Status: Abnormal   Collection Time: 12/22/20  1:00 AM  Result Value Ref Range   Sed Rate 56 (H) 0 - 22 mm/hr    Comment: Performed at [facility] [address], [place], [place] [address]  C-reactive protein     Status: Abnormal   Collection Time: 12/22/20  1:00 AM  Result Value Ref Range   CRP 1.7 (H) <1.0 mg/dL    Comment: Performed at [facility], [place] [address] [place], [place] [address]  Prealbumin     Status: None   Collection Time: 12/22/20  1:00 AM  Result Value Ref Range   Prealbumin 19.7 18 - 38 mg/dL    Comment: Performed at [facility] [address], [place],  [address]  CBG monitoring, ED     Status: None   Collection Time: 12/22/20  1:03 AM  Result Value Ref Range   Glucose-Capillary 79 70 - 99 mg/dL    Comment: Glucose reference range applies only to samples taken after fasting for at least 8 hours.  Basic metabolic panel     Status: Abnormal   Collection Time: 12/22/20  3:40 AM  Result Value Ref Range   Sodium 138 135 - 145 mmol/L   Potassium 5.1 3.5 - 5.1 mmol/L   Chloride 106 98 - 111 mmol/L   CO2 21 (L) 22 - 32 mmol/L   Glucose, Bld 70 70 - 99 mg/dL    Comment: Glucose reference range applies only to samples taken after fasting for at least 8 hours.   BUN 59 (H) 8 - 23 mg/dL   Creatinine, Ser 1.49 (H) 0.44 - 1.00 mg/dL   Calcium 10.0 8.9 - 10.3 mg/dL   GFR, Estimated 32 (L) >60 mL/min    Comment: (NOTE) Calculated using the CKD-EPI Creatinine Equation (2021)    Anion gap 11 5 -  15    Comment: Performed at [facility], [place] [address] [place], [place] [address]  CBC     Status: Abnormal   Collection Time:  12/22/20  3:40 AM  Result Value Ref Range   WBC 4.4 4.0 - 10.5 K/uL   RBC 3.00 (L) 3.87 - 5.11 MIL/uL   Hemoglobin 9.8 (L) 12.0 - 15.0 g/dL   HCT 29.3 (L) 36.0 - 46.0 %   MCV 97.7 80.0 - 100.0 fL   MCH 32.7 26.0 - 34.0 pg   MCHC 33.4 30.0 - 36.0 g/dL   RDW 17.9 (H) 11.5 - 15.5 %   Platelets 196 150 - 400 K/uL   nRBC 0.0 0.0 - 0.2 %    Comment: Performed at [facility], [place] [address] [place], [place] [address]  CBG monitoring, ED     Status: None   Collection Time: 12/22/20  8:08 AM  Result Value Ref Range   Glucose-Capillary 74 70 - 99 mg/dL    Comment: Glucose reference range applies only to samples taken after fasting for at least 8 hours.   Comment 1 Notify RN    Comment 2 Document in Chart   CBG monitoring, ED     Status: Abnormal   Collection Time: 12/22/20  1:20 PM  Result Value Ref Range   Glucose-Capillary 115 (H) 70 - 99 mg/dL    Comment: Glucose reference range applies only to samples taken after fasting for at least 8 hours.   *Note: Due to a large number of results and/or encounters for the requested time period, some results have not been displayed. A complete set of results can be found in Results Review.    MR FOOT RIGHT WO CONTRAST  Result Date: 12/21/2020 CLINICAL DATA:  Foot pain, chronic, osteoarthritis suspected EXAM: MRI OF THE RIGHT FOREFOOT WITHOUT CONTRAST TECHNIQUE: Multiplanar, multisequence MR imaging of the right forefoot was performed. No intravenous contrast was administered. COMPARISON:  X-ray 12/13/2020 FINDINGS: Bones/Joint/Cartilage Patient is status post a transmetatarsal amputation of the first through fifth rays. Destructive changes at the third metatarsal resection margin with prominent bone marrow edema extending to the level of the third metatarsal proximal metaphysis. Intermediate to low T1 signal to the level of the mid third metatarsal diaphysis, compatible with acute osteomyelitis. There is bone marrow edema at the resection margin  at the level of the fifth metatarsal diaphysis concerning for early acute osteomyelitis. The residual first, second, and fourth metatarsals are intact without evidence to suggest acute osteomyelitis at this time. Bony structures of the midfoot and visualized hindfoot remain intact. No fractures. No malalignment. Ligaments Intact Lisfranc ligament. Muscles and Tendons Chronic denervation changes of the intrinsic foot musculature. Diffuse intramuscular edema-like signal may reflect myositis and/or denervation. Post amputation changes to the musculotendinous structures of the right forefoot. Soft tissues Diffuse soft tissue swelling at the distal stump as well as at the dorsum of the forefoot. Superficial ulceration at the distal stump distal to the level of the third metatarsal. No organized or drainable fluid collections. IMPRESSION: 1. Acute osteomyelitis of the right third metatarsal. 2. Bone marrow edema at the resection margin of the fifth metatarsal concerning for early acute osteomyelitis. 3. Diffuse soft tissue swelling at the distal stump as well as at the dorsum of the forefoot. Superficial ulceration at the distal stump distal to the level of the third metatarsal. No organized or drainable fluid collections. Electronically Signed   By: Davina Poke D.O.   On:  12/21/2020 14:25   MR FOOT LEFT WO CONTRAST  Result Date: 12/21/2020 CLINICAL DATA:  Chronic foot pain EXAM: MRI OF THE LEFT FOOT WITHOUT CONTRAST TECHNIQUE: Multiplanar, multisequence MR imaging of the left forefoot was performed. No intravenous contrast was administered. COMPARISON:  Radiographs from 12/13/2020 FINDINGS: Bones/Joint/Cartilage The second and third toes are absent past the distal metatarsal level. There is lateral dislocation of the base of the proximal phalanx of the great toe with respect to the first metatarsal head, with erosion and potentially bony destruction of a substantial portion of the first metatarsal head medially  and extensive marrow edema in the distal half of the first metatarsal. There is also angulation at the first MTP joint compatible with hallux valgus. There is likewise marrow edema in the proximal phalanx great toe and in the adjacent sesamoids, with erosion of the medial base of the proximal phalanx. Transverse fracture of the distal head of the fourth metacarpal. Notable surrounding edema tracking all the way back into the base of the fourth metacarpal. A component of superimposed osteomyelitis is not excluded. Faint edema anteriorly in the calcaneus on image 13 series 8, nonspecific. Ligaments The Lisfranc ligament appears intact. Given the dislocation, ligamentous structures at the first MTP joint are thought to be insufficient. Muscles and Tendons Muscular atrophy in the forefoot. Soft tissues Subcutaneous edema in the soft tissues surrounding the first MTP joint, possibly cellulitis. IMPRESSION: 1. Dislocation and hallux valgus at the first MTP joint, with bony erosions particularly in the head of the first metatarsal medially, but also in the base of the proximal phalanx, with marrow edema in both of the structures. Although gout can sometimes cause a similar arthropathy, the appearance is concerning for osteomyelitis and possibly septic joint. 2. Transverse fracture of the head of the third metatarsal. The degree of edema throughout the bone is somewhat greater than I would expect for transverse fracture, and raise the possibility of superimposed osteomyelitis. 3. The second and third digits appear amputated at the distal metatarsal level. Electronically Signed   By: Van Clines M.D.   On: 12/21/2020 14:30   DG Foot Complete Left  Result Date: 12/21/2020 Please see detailed radiograph report in office note.  DG Foot Complete Right  Result Date: 12/21/2020 Please see detailed radiograph report in office note.   Review of Systems Blood pressure (!) 145/95, pulse 73, temperature 98.6 F (37  C), temperature source Oral, resp. rate 15, SpO2 99 %. Physical Exam Dermatological: Full-thickness ulceration on the right foot along the third metatarsal remnant with purulence expressed.  Ulceration submet 5 5.  Mild edema to the distal amputation site there is no significant erythema, warmth there is no ascending cellulitis.  The foot appears to be stable.  On the left side granular wound present on the area of the first MPJ with minimal edema.  No significant erythema or warmth to the foot.  Vascular: Dorsalis Pedis artery and Posterior Tibial artery pedal pulses are decreased bilateral with immedate capillary fill time. There is no pain with calf compression, swelling, warmth, erythema.   Neruologic: Sensation decrease  Musculoskeletal: TMA right foot, bunion left side  Assessment/Plan: Osteomyelitis, ulcerations bilaterally  On long discussion with the patient and then her daughter via the phone in regards to the MRI findings.  We discussed options including continued antibiotics without any surgical debridement/ deep cultures.  My recommendation would least be to do surgery for debridement of the ulcers, bone biopsies.  She does not want surgery she reports,  especially amputation.  I discussed this with the patient's daughter as well.  She was open to the idea of debridement, biopsy.  I again discussed this with the patient and she was very tearful about it.  I discussed with Dr. Amalia Hailey who she knows well and he is good to come by to see the patient as well.  No immediate surgical plans for right now.  We will continue discussions with the family.  Podiatry will continue to follow.  Trula Slade 12/22/2020, 3:20 PM

## 2020-12-22 NOTE — ED Notes (Addendum)
Spoke to pt daughter before administering mications PO. Pt is able to take PO medications and drink clear liquids. MD made aware . Pt is also able to eat ice cream, jello and some yogurt without pulp

## 2020-12-22 NOTE — Consult Note (Signed)
[facility] Nurse Consult Note: Reason for Consult:Chronic, nonhealing wounds on bilateral feet. Followed by Podiatric Medicine (Dr. Marilynn Rail), Infectious Disease and Vascular Surgery in the community. Chronic osteomyelitis. PAD. 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 Photos were uploaded to the EHR by the admitting provider. Wound type: full thickness, infectious Pressure Injury POA: N/A Wound JQZ:ESPQ pink, moist Drainage (amount, consistency, odor) small serous to light yellow Periwound:intact, dry Dressing procedure/placement/frequency: I will continue the outpatient podiatry POC using a daily application of betadine ointment after washing feet with soap and water and patting dry. Dry dressings are to top the betadine and dressings secured with Kerlix roll gauze/paper tape.  Heels are to be floated to prevent pressure injury.  [facility] nursing team will not follow, but will remain available to this patient, the nursing and medical teams.  Please re-consult if needed. Thanks, Maudie Flakes, MSN, RN, [place], Arther Abbott  Pager# [phone]

## 2020-12-22 NOTE — ED Notes (Signed)
ED TO INPATIENT HANDOFF REPORT  ED Nurse Name and Phone #:  [phone]  S Name/Age/Gender Shannon [name] 87 y.o. female Room/Bed: 038C/038C  Code Status   Code Status: DNR  Home/SNF/Other Home Patient oriented to: self, place, time, and situation Is this baseline? Yes   Triage Complete: Triage complete  Chief Complaint Acute encephalopathy [G93.40]  Triage Note Pt arrived by EMS from home, daughter states that pt was placed on 2 new antibiotics yesterday and today when she woke up she was weak and lethargic  Pt has a feeding tube did not have her feed this morning  Pt does not have her hearing aids with her and is hard of hearing      Allergies Allergies  Allergen Reactions   Penicillins Itching, Rash and Other (See Comments)    Tolerated amoxicillin, unasyn, zosyn & cephalosporins in the past. Did it involve swelling of the face/tongue/throat, SOB, or low BP? No Did it involve sudden or severe rash/hives, skin peeling, or any reaction on the inside of your mouth or nose? No Did you need to seek medical attention at a hospital or doctor's office? Unknown When did it last happen?      5+ years If all above answers are "NO", may proceed with cephalosporin use.     Level of Care/Admitting Diagnosis ED Disposition     ED Disposition  Admit   Condition  --   Comment  Hospital Area: [facility] [[id number]]  Level of Care: Telemetry Medical [104]  May admit patient to [facility] or [facility] if equivalent level of care is available:: No  Covid Evaluation: Asymptomatic Screening Protocol (No Symptoms)  Diagnosis: Acute encephalopathy [657846]  Admitting Physician: Doreatha Massed  Attending Physician: Etta Quill [phone]  Estimated length of stay: past midnight tomorrow  Certification:: I certify this patient will need inpatient services for at least 2 midnights          B Medical/Surgery History Past Medical History:  Diagnosis  Date   Acute respiratory failure ([place])    Anemia    previous blood transfusions   Arthritis    "all over"   Asthma    Atrial flutter (HCC)    Bradycardia    requiring previous d/c of BB and reduction of amiodarone   CAD (coronary artery disease)    nonobstructive per notes   Chronic diastolic CHF (congestive heart failure) ([place])    CKD (chronic kidney disease), stage III ([place])    Complication of blood transfusion    "got the wrong blood type at [facility] in ~ 2015; no adverse reaction that we are aware of"/daughter, [name] (01/27/2016)   COPD (chronic obstructive pulmonary disease) ([place])    Depression    "light case"   DVT (deep venous thrombosis) ([place]) 01/2016   a. LLE DVT 01/2016 - switched from Eliquis to Coumadin.   Dyspnea    with some activity   Gastric stenosis    a. s/p stomach tube   GERD (gastroesophageal reflux disease)    Headache    History of 2019 novel coronavirus disease (COVID-19)    History of blood transfusion    "several" (01/27/2016)   History of stomach ulcers    Hyperlipidemia    Hypertension    Hypothyroidism    On home oxygen therapy    "2L; 9PM til 9AM" (06/27/2017)   PAD (peripheral artery disease) (HCC)    a. prior gangrene, toe amputations, intervention.   PAF (  paroxysmal atrial fibrillation) ([place])    Paraesophageal hernia    Perforated gastric ulcer ([place])    Pneumonia    "a few times" (06/27/2017)   Seasonal allergies    SIADH (syndrome of inappropriate ADH production) ([place])    [name] 01/10/2015   Small bowel obstruction ([place])    "I don't know how many" (01/11/2015)   Stroke ([place])    several- no residual   Type II diabetes mellitus ([place])    "related to prednisone use  for > 20 yr; once predinose stopped; no more DM RX" (01/27/2016)   UTI (urinary tract infection) 02/08/2016   Ventral hernia with bowel obstruction    Past Surgical History:  Procedure Laterality Date   ABDOMINAL AORTOGRAM N/A 09/21/2016   Procedure: Abdominal  Aortogram;  Surgeon: Waynetta Sandy, MD;  Location: [facility];  Service: Cardiovascular;  Laterality: N/A;   ABDOMINAL AORTOGRAM W/LOWER EXTREMITY N/A 10/20/2020   Procedure: ABDOMINAL AORTOGRAM W/LOWER EXTREMITY;  Surgeon: Cherre Robins, MD;  Location: [facility];  Service: Cardiovascular;  Laterality: N/A;   AMPUTATION Left 09/25/2016   Procedure: AMPUTATION DIGIT- LEFT 2ND AND 3RD TOES;  Surgeon: Rosetta Posner, MD;  Location: [facility];  Service: Vascular;  Laterality: Left;   AMPUTATION Right 05/03/2018   Procedure: AMPUTATION RIGHT GREAT TOE;  Surgeon: Rosetta Posner, MD;  [facility];  Service: Vascular;  Laterality: Right;   CATARACT EXTRACTION W/ INTRAOCULAR LENS  IMPLANT, BILATERAL     CHOLECYSTECTOMY OPEN     COLECTOMY     ESOPHAGOGASTRODUODENOSCOPY N/A 01/19/2014   Procedure: ESOPHAGOGASTRODUODENOSCOPY (EGD);  Surgeon: Irene Shipper, MD;  [facility];  Service: Endoscopy;  Laterality: N/A;   ESOPHAGOGASTRODUODENOSCOPY N/A 01/20/2014   Procedure: ESOPHAGOGASTRODUODENOSCOPY (EGD);  Surgeon: Irene Shipper, MD;  Location[facility];  Service: Endoscopy;  Laterality: N/A;   ESOPHAGOGASTRODUODENOSCOPY N/A 03/19/2014   Procedure: ESOPHAGOGASTRODUODENOSCOPY (EGD);  Surgeon: Milus Banister, MD;  Location: [facility];  Service: Endoscopy;  Laterality: N/A;   ESOPHAGOGASTRODUODENOSCOPY N/A 07/08/2015   Procedure: ESOPHAGOGASTRODUODENOSCOPY (EGD);  Surgeon: Doran Stabler, MD;  Location: [facility];  Service: Endoscopy;  Laterality: N/A;   ESOPHAGOGASTRODUODENOSCOPY (EGD) WITH PROPOFOL N/A 09/15/2015   Procedure: ESOPHAGOGASTRODUODENOSCOPY (EGD) WITH PROPOFOL;  Surgeon: Manus Gunning, MD;  [facility];  Service: Gastroenterology;  Laterality: N/A;   ESOPHAGOGASTRODUODENOSCOPY (EGD) WITH PROPOFOL N/A 10/12/2020   Procedure: ESOPHAGOGASTRODUODENOSCOPY (EGD) WITH PROPOFOL;  Surgeon: Gatha Mayer, MD;  Location: [facility];   Service: Endoscopy;  Laterality: N/A;   GASTROJEJUNOSTOMY     hx/notes 01/10/2015   GASTROJEJUNOSTOMY N/A 09/23/2015   Procedure: OPEN GASTROJEJUNOSTOMY TUBE PLACEMENT;  Surgeon: Arta Bruce Kinsinger, MD;  [facility];  Service: General;  Laterality: N/A;   GLAUCOMA SURGERY Bilateral    HEMATOMA EVACUATION Right 10/20/2020   Procedure: EVACUATION HEMATOMA RIGHT GROIN;  Surgeon: Cherre Robins, MD;  Location: [facility];  Service: Vascular;  Laterality: Right;   IR CM INJ ANY COLONIC TUBE W/FLUORO  01/05/2017   IR CM INJ ANY COLONIC TUBE W/FLUORO  06/07/2017   IR CM INJ ANY COLONIC TUBE W/FLUORO  11/05/2017   IR CM INJ ANY COLONIC TUBE W/FLUORO  09/18/2018   IR CM INJ ANY COLONIC TUBE W/FLUORO  03/06/2019   IR CM INJ ANY COLONIC TUBE W/FLUORO  03/11/2019   IR GASTR TUBE CONVERT GASTR-JEJ PER W/FL MOD SED  04/30/2020   IR GENERIC HISTORICAL  01/07/2016   IR [place] TUBE CHANGE 01/07/2016  Jacqulynn Cadet, MD [facility]   IR GENERIC HISTORICAL  01/27/2016   IR MECH REMOV OBSTRUC MAT ANY COLON TUBE W/FLUORO 01/27/2016 Markus Daft, MD [facility]   IR GENERIC HISTORICAL  02/07/2016   IR PATIENT EVAL TECH 0-60 MINS Darrell K Allred, PA-C [facility]   IR GENERIC HISTORICAL  02/08/2016   IR GJ TUBE CHANGE 02/08/2016 Greggory Keen, MD [facility]   IR GENERIC HISTORICAL  01/06/2016   IR GJ TUBE CHANGE 01/06/2016 [facility] OUT REF   IR GENERIC HISTORICAL  05/02/2016   IR CM INJ ANY COLONIC TUBE W/FLUORO 05/02/2016 Arne Cleveland, MD [facility]   IR GENERIC HISTORICAL  05/15/2016   IR GJ TUBE CHANGE 05/15/2016 Sandi Mariscal, MD [facility]   IR GENERIC HISTORICAL  06/28/2016   IR GJ TUBE CHANGE 06/28/2016 [facility]   IR GJ TUBE CHANGE  02/20/2017   IR GJ TUBE CHANGE  05/10/2017   IR GJ TUBE CHANGE  07/06/2017   IR GJ TUBE CHANGE  08/02/2017   IR GJ TUBE CHANGE  10/15/2017   IR GJ TUBE CHANGE  12/26/2017   IR GJ TUBE CHANGE  04/08/2018   IR GJ TUBE CHANGE  06/19/2018   IR GJ TUBE CHANGE   03/03/2019   IR GJ TUBE CHANGE  06/06/2019   IR GJ TUBE CHANGE  09/26/2019   IR GJ TUBE CHANGE  01/09/2020   IR GJ TUBE CHANGE  05/12/2020   IR GJ TUBE CHANGE  10/05/2020   IR GJ TUBE CHANGE  10/14/2020   IR GJ TUBE CHANGE  10/19/2020   IR PATIENT EVAL TECH 0-60 MINS  10/19/2016   IR PATIENT EVAL TECH 0-60 MINS  12/25/2016   IR PATIENT EVAL TECH 0-60 MINS  01/29/2017   IR PATIENT EVAL TECH 0-60 MINS  04/04/2017   IR PATIENT EVAL TECH 0-60 MINS  04/30/2017   IR PATIENT EVAL TECH 0-60 MINS  07/31/2017   IR REPLC DUODEN/JEJUNO TUBE PERCUT W/FLUORO  11/14/2016   LAPAROTOMY N/A 01/20/2015   Procedure: EXPLORATORY LAPAROTOMY;  Surgeon: Coralie Keens, MD;  Location: [facility];  Service: General;  Laterality: N/A;   LOWER EXTREMITY ANGIOGRAPHY Left 09/21/2016   Procedure: Lower Extremity Angiography;  Surgeon: Waynetta Sandy, MD;  Location: [facility];  Service: Cardiovascular;  Laterality: Left;   LOWER EXTREMITY ANGIOGRAPHY Right 06/27/2017   Procedure: Lower Extremity Angiography;  Surgeon: Waynetta Sandy, MD;  Location: [facility];  Service: Cardiovascular;  Laterality: Right;   LYSIS OF ADHESION N/A 01/20/2015   Procedure: LYSIS OF ADHESIONS < 1 HOUR;  Surgeon: Coralie Keens, MD;  Location: [facility];  Service: General;  Laterality: N/A;   PERIPHERAL VASCULAR BALLOON ANGIOPLASTY Left 09/21/2016   Procedure: Peripheral Vascular Balloon Angioplasty;  Surgeon: Waynetta Sandy, MD;  Location: [facility];  Service: Cardiovascular;  Laterality: Left;  drug coated balloon   PERIPHERAL VASCULAR BALLOON ANGIOPLASTY Right 06/27/2017   Procedure: PERIPHERAL VASCULAR BALLOON ANGIOPLASTY;  Surgeon: Waynetta Sandy, MD;  Location: [facility];  Service: Cardiovascular;  Laterality: Right;  SFA/TPTRUNK   PERIPHERAL VASCULAR INTERVENTION Left 10/20/2020   Procedure: PERIPHERAL VASCULAR INTERVENTION;  Surgeon: Cherre Robins, MD;   Location: [facility];  Service: Cardiovascular;  Laterality: Left;   TONSILLECTOMY     TRANSMETATARSAL AMPUTATION Right 11/14/2019   Procedure: TRANSMETATARSAL AMPUTATION RIGHT FOOT;  Surgeon: Edrick Kins, DPM;  Location: [facility];  Service: Podiatry;  Laterality: Right;  TUBAL LIGATION     VENTRAL HERNIA REPAIR  2015   incarcerated ventral hernia ([facility] 09/2013)/notes 01/10/2015   WOUND EXPLORATION Right 10/20/2020   Procedure: PRIMARY CLOSURE COMMON FEMORAL ARTERY;  Surgeon: Cherre Robins, MD;  Location: [facility];  Service: Vascular;  Laterality: Right;     A IV Location/Drains/Wounds Patient Lines/Drains/Airways Status     Active Line/Drains/Airways     Name Placement date Placement time Site Days   Peripheral IV 12/21/20 20 G 1" Left;Posterior Hand 12/21/20  2030  Hand  1   Gastrostomy/Enterostomy 22 Fr. LUQ 10/19/20  1642  LUQ  64   External Urinary Catheter 12/21/20  2034  --  1   Incision (Closed) 04/30/20 Foot Right 04/30/20  0400  -- 236   Incision (Closed) 10/20/20 Groin Right 10/20/20  1314  -- 63   Pressure Injury 04/30/20 Buttocks Left;Upper Stage 2 -  Partial thickness loss of dermis presenting as a shallow open injury with a red, pink wound bed without slough. small 1/4cmx1/4cm round pressure wound, pink moist wound bed, no drainage 04/30/20  0400  -- 236   Wound / Incision (Open or Dehisced) 04/30/20 Diabetic ulcer Toe (Comment  which one) Left 2cm x 1cm oval shaped wound, wound bed pink and brown with pink/yellow drainage 04/30/20  0400  Toe (Comment  which one)  236   Wound / Incision (Open or Dehisced) 10/11/20 Diabetic ulcer Foot Left;Lateral 10/11/20  1915  Foot  72   Wound / Incision (Open or Dehisced) 10/11/20 Diabetic ulcer Foot Anterior;Right 10/11/20  --  Foot  72   Wound / Incision (Open or Dehisced) 10/11/20 Diabetic ulcer Right;Anterior 10/11/20  1915  --  72            Intake/Output Last 24 hours  Intake/Output Summary (Last 24 hours) at  12/22/2020 2118 Last data filed at 12/22/2020 0149 Gross per 24 hour  Intake 50 ml  Output --  Net 50 ml    Labs/Imaging Results for orders placed or performed during the hospital encounter of 12/21/20 (from the past 48 hour(s))  Blood culture (routine x 2)     Status: None (Preliminary result)   Collection Time: 12/21/20 10:29 AM   Specimen: BLOOD  Result Value Ref Range   Specimen Description BLOOD RIGHT ANTECUBITAL    Special Requests      BOTTLES DRAWN AEROBIC ONLY Blood Culture results may not be optimal due to an inadequate volume of blood received in culture bottles   Culture      NO GROWTH 1 DAY Performed at [facility] [address], [place], [place] [address]    Report Status PENDING   CBC with Differential     Status: Abnormal   Collection Time: 12/21/20 10:30 AM  Result Value Ref Range   WBC 4.2 4.0 - 10.5 K/uL   RBC 2.89 (L) 3.87 - 5.11 MIL/uL   Hemoglobin 9.2 (L) 12.0 - 15.0 g/dL   HCT 28.0 (L) 36.0 - 46.0 %   MCV 96.9 80.0 - 100.0 fL   MCH 31.8 26.0 - 34.0 pg   MCHC 32.9 30.0 - 36.0 g/dL   RDW 17.8 (H) 11.5 - 15.5 %   Platelets 194 150 - 400 K/uL   nRBC 0.0 0.0 - 0.2 %   Neutrophils Relative % 59 %   Neutro Abs 2.5 1.7 - 7.7 K/uL   Lymphocytes Relative 13 %   Lymphs Abs 0.5 (L) 0.7 - 4.0 K/uL  Monocytes Relative 23 %   Monocytes Absolute 1.0 0.1 - 1.0 K/uL   Eosinophils Relative 4 %   Eosinophils Absolute 0.2 0.0 - 0.5 K/uL   Basophils Relative 1 %   Basophils Absolute 0.0 0.0 - 0.1 K/uL   Immature Granulocytes 0 %   Abs Immature Granulocytes 0.01 0.00 - 0.07 K/uL    Comment: Performed at [facility] [address] [place], [place] [address]  Comprehensive metabolic panel     Status: Abnormal   Collection Time: 12/21/20 10:30 AM  Result Value Ref Range   Sodium 136 135 - 145 mmol/L   Potassium 4.5 3.5 - 5.1 mmol/L   Chloride 102 98 - 111 mmol/L   CO2 24 22 - 32 mmol/L   Glucose, Bld 122 (H) 70 - 99 mg/dL    Comment: Glucose  reference range applies only to samples taken after fasting for at least 8 hours.   BUN 70 (H) 8 - 23 mg/dL   Creatinine, Ser 1.65 (H) 0.44 - 1.00 mg/dL   Calcium 9.4 8.9 - 10.3 mg/dL   Total Protein 6.9 6.5 - 8.1 g/dL   Albumin 3.0 (L) 3.5 - 5.0 g/dL   AST 20 15 - 41 U/L   ALT 14 0 - 44 U/L   Alkaline Phosphatase 76 38 - 126 U/L   Total Bilirubin 0.4 0.3 - 1.2 mg/dL   GFR, Estimated 28 (L) >60 mL/min    Comment: (NOTE) Calculated using the CKD-EPI Creatinine Equation (2021)    Anion gap 10 5 - 15    Comment: Performed at [facility] [address], [place], [place] [address]  Lactic acid, plasma     Status: None   Collection Time: 12/21/20 10:30 AM  Result Value Ref Range   Lactic Acid, Venous 0.7 0.5 - 1.9 mmol/L    Comment: Performed at [facility] [address] [place], [place] [address]  Blood culture (routine x 2)     Status: None (Preliminary result)   Collection Time: 12/21/20 10:34 AM   Specimen: BLOOD  Result Value Ref Range   Specimen Description BLOOD SITE NOT SPECIFIED    Special Requests      BOTTLES DRAWN AEROBIC ONLY Blood Culture results may not be optimal due to an inadequate volume of blood received in culture bottles   Culture      NO GROWTH < 24 HOURS Performed at [facility] [address] [place], [place] [address]    Report Status PENDING   Urinalysis, Routine w reflex microscopic Urine, Clean Catch     Status: None   Collection Time: 12/21/20  8:34 PM  Result Value Ref Range   Color, Urine YELLOW YELLOW   APPearance CLEAR CLEAR   Specific Gravity, Urine 1.010 1.005 - 1.030   pH 7.5 5.0 - 8.0   Glucose, UA NEGATIVE NEGATIVE mg/dL   Hgb urine dipstick NEGATIVE NEGATIVE   Bilirubin Urine NEGATIVE NEGATIVE   Ketones, ur NEGATIVE NEGATIVE mg/dL   Protein, ur NEGATIVE NEGATIVE mg/dL   Nitrite NEGATIVE NEGATIVE   Leukocytes,Ua NEGATIVE NEGATIVE    Comment: Microscopic not done on urines with negative protein, blood,  leukocytes, nitrite, or glucose < 500 mg/dL. Performed at [facility], [place] [address], [place], [place] [address]   Resp Panel by RT-PCR (Flu A&B, Covid) Urine, Clean Catch     Status: None   Collection Time: 12/21/20  8:34 PM   Specimen: Urine,  Clean Catch; Nasopharyngeal(NP) swabs in vial transport medium  Result Value Ref Range   SARS Coronavirus 2 by RT PCR NEGATIVE NEGATIVE    Comment: (NOTE) SARS-CoV-2 target nucleic acids are NOT DETECTED.  The SARS-CoV-2 RNA is generally detectable in upper respiratory specimens during the acute phase of infection. The lowest concentration of SARS-CoV-2 viral copies this assay can detect is 138 copies/mL. A negative result does not preclude SARS-Cov-2 infection and should not be used as the sole basis for treatment or other patient management decisions. A negative result may occur with  improper specimen collection/handling, submission of specimen other than nasopharyngeal swab, presence of viral mutation(s) within the areas targeted by this assay, and inadequate number of viral copies(<138 copies/mL). A negative result must be combined with clinical observations, patient history, and epidemiological information. The expected result is Negative.  Fact Sheet for Patients:  EntrepreneurPulse.com.au  Fact Sheet for Healthcare Providers:  IncredibleEmployment.be  This test is no t yet approved or cleared by the [place] FDA and  has been authorized for detection and/or diagnosis of SARS-CoV-2 by FDA under an Emergency Use Authorization (EUA). This EUA will remain  in effect (meaning this test can be used) for the duration of the COVID-19 declaration under Section 564(b)(1) of the Act, 21 U.S.C.section 360bbb-3(b)(1), unless the authorization is terminated  or revoked sooner.       Influenza A by PCR NEGATIVE NEGATIVE   Influenza B by PCR NEGATIVE NEGATIVE    Comment: (NOTE) The Xpert Xpress  SARS-CoV-2/FLU/RSV plus assay is intended as an aid in the diagnosis of influenza from Nasopharyngeal swab specimens and should not be used as a sole basis for treatment. Nasal washings and aspirates are unacceptable for Xpert Xpress SARS-CoV-2/FLU/RSV testing.  Fact Sheet for Patients: EntrepreneurPulse.com.au  Fact Sheet for Healthcare Providers: IncredibleEmployment.be  This test is not yet approved or cleared by the [place] FDA and has been authorized for detection and/or diagnosis of SARS-CoV-2 by FDA under an Emergency Use Authorization (EUA). This EUA will remain in effect (meaning this test can be used) for the duration of the COVID-19 declaration under Section 564(b)(1) of the Act, 21 U.S.C. section 360bbb-3(b)(1), unless the authorization is terminated or revoked.  Performed at [facility], [place] [address] [place], [place] [address]   MRSA Next Gen by PCR, Nasal     Status: None   Collection Time: 12/22/20  1:00 AM   Specimen: Nasal Mucosa; Nasal Swab  Result Value Ref Range   MRSA by PCR Next Gen NOT DETECTED NOT DETECTED    Comment: (NOTE) The GeneXpert MRSA Assay (FDA approved for NASAL specimens only), is one component of a comprehensive MRSA colonization surveillance program. It is not intended to diagnose MRSA infection nor to guide or monitor treatment for MRSA infections. Test performance is not FDA approved in patients less than 73 years old. Performed at [facility], [place] [address], [place], [place] [address]   Sedimentation rate     Status: Abnormal   Collection Time: 12/22/20  1:00 AM  Result Value Ref Range   Sed Rate 56 (H) 0 - 22 mm/hr    Comment: Performed at [facility] [address] [place], [place] [address]  C-reactive protein     Status: Abnormal   Collection Time: 12/22/20  1:00 AM  Result Value Ref Range   CRP 1.7 (H) <1.0 mg/dL    Comment: Performed at [facility], [place]  [address], [place], [place] [address]  Prealbumin     Status: None   Collection Time: 12/22/20  1:00 AM  Result Value Ref Range   Prealbumin 19.7 18 - 38 mg/dL    Comment: Performed at [facility] [address], [place], [place] [address]  CBG monitoring, ED     Status: None   Collection Time: 12/22/20  1:03 AM  Result Value Ref Range   Glucose-Capillary 79 70 - 99 mg/dL    Comment: Glucose reference range applies only to samples taken after fasting for at least 8 hours.  Basic metabolic panel     Status: Abnormal   Collection Time: 12/22/20  3:40 AM  Result Value Ref Range   Sodium 138 135 - 145 mmol/L   Potassium 5.1 3.5 - 5.1 mmol/L   Chloride 106 98 - 111 mmol/L   CO2 21 (L) 22 - 32 mmol/L   Glucose, Bld 70 70 - 99 mg/dL    Comment: Glucose reference range applies only to samples taken after fasting for at least 8 hours.   BUN 59 (H) 8 - 23 mg/dL   Creatinine, Ser 1.49 (H) 0.44 - 1.00 mg/dL   Calcium 10.0 8.9 - 10.3 mg/dL   GFR, Estimated 32 (L) >60 mL/min    Comment: (NOTE) Calculated using the CKD-EPI Creatinine Equation (2021)    Anion gap 11 5 - 15    Comment: Performed at [facility] [address], [place], [place] [address]  CBC     Status: Abnormal   Collection Time: 12/22/20  3:40 AM  Result Value Ref Range   WBC 4.4 4.0 - 10.5 K/uL   RBC 3.00 (L) 3.87 - 5.11 MIL/uL   Hemoglobin 9.8 (L) 12.0 - 15.0 g/dL   HCT 29.3 (L) 36.0 - 46.0 %   MCV 97.7 80.0 - 100.0 fL   MCH 32.7 26.0 - 34.0 pg   MCHC 33.4 30.0 - 36.0 g/dL   RDW 17.9 (H) 11.5 - 15.5 %   Platelets 196 150 - 400 K/uL   nRBC 0.0 0.0 - 0.2 %    Comment: Performed at [facility], [place] [address] [place], [place] [address]  CBG monitoring, ED     Status: None   Collection Time: 12/22/20  8:08 AM  Result Value Ref Range   Glucose-Capillary 74 70 - 99 mg/dL    Comment: Glucose reference range applies only to samples taken after fasting for at least 8 hours.   Comment 1 Notify RN     Comment 2 Document in Chart   CBG monitoring, ED     Status: Abnormal   Collection Time: 12/22/20  1:20 PM  Result Value Ref Range   Glucose-Capillary 115 (H) 70 - 99 mg/dL    Comment: Glucose reference range applies only to samples taken after fasting for at least 8 hours.   *Note: Due to a large number of results and/or encounters for the requested time period, some results have not been displayed. A complete set of results can be found in Results Review.   MR FOOT RIGHT WO CONTRAST  Result Date: 12/21/2020 CLINICAL DATA:  Foot pain, chronic, osteoarthritis suspected EXAM: MRI OF THE RIGHT FOREFOOT WITHOUT CONTRAST TECHNIQUE: Multiplanar, multisequence MR imaging of the right forefoot was performed. No intravenous contrast was administered. COMPARISON:  X-ray 12/13/2020 FINDINGS: Bones/Joint/Cartilage Patient is status post a transmetatarsal amputation of the first through fifth rays. Destructive changes at the third metatarsal resection margin  with prominent bone marrow edema extending to the level of the third metatarsal proximal metaphysis. Intermediate to low T1 signal to the level of the mid third metatarsal diaphysis, compatible with acute osteomyelitis. There is bone marrow edema at the resection margin at the level of the fifth metatarsal diaphysis concerning for early acute osteomyelitis. The residual first, second, and fourth metatarsals are intact without evidence to suggest acute osteomyelitis at this time. Bony structures of the midfoot and visualized hindfoot remain intact. No fractures. No malalignment. Ligaments Intact Lisfranc ligament. Muscles and Tendons Chronic denervation changes of the intrinsic foot musculature. Diffuse intramuscular edema-like signal may reflect myositis and/or denervation. Post amputation changes to the musculotendinous structures of the right forefoot. Soft tissues Diffuse soft tissue swelling at the distal stump as well as at the dorsum of the forefoot.  Superficial ulceration at the distal stump distal to the level of the third metatarsal. No organized or drainable fluid collections. IMPRESSION: 1. Acute osteomyelitis of the right third metatarsal. 2. Bone marrow edema at the resection margin of the fifth metatarsal concerning for early acute osteomyelitis. 3. Diffuse soft tissue swelling at the distal stump as well as at the dorsum of the forefoot. Superficial ulceration at the distal stump distal to the level of the third metatarsal. No organized or drainable fluid collections. Electronically Signed   By: Davina Poke D.O.   On: 12/21/2020 14:25   MR FOOT LEFT WO CONTRAST  Result Date: 12/21/2020 CLINICAL DATA:  Chronic foot pain EXAM: MRI OF THE LEFT FOOT WITHOUT CONTRAST TECHNIQUE: Multiplanar, multisequence MR imaging of the left forefoot was performed. No intravenous contrast was administered. COMPARISON:  Radiographs from 12/13/2020 FINDINGS: Bones/Joint/Cartilage The second and third toes are absent past the distal metatarsal level. There is lateral dislocation of the base of the proximal phalanx of the great toe with respect to the first metatarsal head, with erosion and potentially bony destruction of a substantial portion of the first metatarsal head medially and extensive marrow edema in the distal half of the first metatarsal. There is also angulation at the first MTP joint compatible with hallux valgus. There is likewise marrow edema in the proximal phalanx great toe and in the adjacent sesamoids, with erosion of the medial base of the proximal phalanx. Transverse fracture of the distal head of the fourth metacarpal. Notable surrounding edema tracking all the way back into the base of the fourth metacarpal. A component of superimposed osteomyelitis is not excluded. Faint edema anteriorly in the calcaneus on image 13 series 8, nonspecific. Ligaments The Lisfranc ligament appears intact. Given the dislocation, ligamentous structures at the first  MTP joint are thought to be insufficient. Muscles and Tendons Muscular atrophy in the forefoot. Soft tissues Subcutaneous edema in the soft tissues surrounding the first MTP joint, possibly cellulitis. IMPRESSION: 1. Dislocation and hallux valgus at the first MTP joint, with bony erosions particularly in the head of the first metatarsal medially, but also in the base of the proximal phalanx, with marrow edema in both of the structures. Although gout can sometimes cause a similar arthropathy, the appearance is concerning for osteomyelitis and possibly septic joint. 2. Transverse fracture of the head of the third metatarsal. The degree of edema throughout the bone is somewhat greater than I would expect for transverse fracture, and raise the possibility of superimposed osteomyelitis. 3. The second and third digits appear amputated at the distal metatarsal level. Electronically Signed   By: Van Clines M.D.   On: 12/21/2020 14:30  DG Foot Complete Left  Result Date: 12/21/2020 Please see detailed radiograph report in office note.  DG Foot Complete Right  Result Date: 12/21/2020 Please see detailed radiograph report in office note.   Pending Labs Unresulted Labs (From admission, onward)     Start     Ordered   12/23/20 0500  CBC with Differential/Platelet  Tomorrow morning,   R        12/22/20 1750   12/23/20 0500  Comprehensive metabolic panel  Tomorrow morning,   R        12/22/20 1750   12/23/20 0500  Magnesium  Tomorrow morning,   R        12/22/20 1750   12/23/20 0500  Phosphorus  Tomorrow morning,   R        12/22/20 1750            Vitals/Pain Today's Vitals   12/22/20 1800 12/22/20 1900 12/22/20 2000 12/22/20 2100  BP: (!) 145/57 (!) 143/56 (!) 135/54 (!) 136/53  Pulse: 80     Resp: 19 (!) 22 17 (!) 21  Temp:      TempSrc:      SpO2: 99%     PainSc:        Isolation Precautions No active isolations  Medications Medications  0.9 %  sodium chloride infusion (  Intravenous Rate/Dose Verify 12/22/20 0635)  heparin injection 5,000 Units (5,000 Units Subcutaneous Given 12/22/20 1655)  ondansetron (ZOFRAN) tablet 4 mg (has no administration in time range)    Or  ondansetron (ZOFRAN) injection 4 mg (has no administration in time range)  free water 180 mL (180 mLs Per Tube Not Given 12/22/20 1527)  feeding supplement (OSMOLITE 1.5 CAL) liquid (65 mL/hr Per Tube New Bag/Given 12/22/20 1433)  artificial tears (LACRILUBE) ophthalmic ointment 1 application (1 application Both Eyes Not Given 12/22/20 0753)  senna-docusate (Senokot-S) tablet 1 tablet (1 tablet Oral Given 12/22/20 0122)  artificial tears (LACRILUBE) ophthalmic ointment 1 application (has no administration in time range)  pantoprazole (PROTONIX) EC tablet 40 mg (40 mg Oral Given 12/22/20 1048)  oxyCODONE (Oxy IR/ROXICODONE) immediate release tablet 2.5-5 mg (5 mg Oral Given 12/22/20 1056)  nystatin cream (MYCOSTATIN) 1 application (1 application Topical Not Given 12/22/20 1053)  aspirin EC tablet 81 mg (81 mg Oral Given 12/22/20 1050)  atorvastatin (LIPITOR) tablet 10 mg (10 mg Oral Given 12/22/20 1049)  clopidogrel (PLAVIX) tablet 75 mg (75 mg Oral Given 12/22/20 1048)  DULoxetine (CYMBALTA) DR capsule 30 mg (30 mg Oral Given 12/22/20 1059)  ferrous sulfate 220 (44 Fe) MG/5ML solution 325 mg (325 mg Oral Given 12/22/20 1616)  fluticasone (FLONASE) 50 MCG/ACT nasal spray 2 spray (2 sprays Each Nare Given 12/22/20 1430)  gabapentin (NEURONTIN) capsule 300 mg (300 mg Oral Given 12/22/20 1047)  ipratropium-albuterol (DUONEB) 0.5-2.5 (3) MG/3ML nebulizer solution 3 mL (has no administration in time range)  latanoprost (XALATAN) 0.005 % ophthalmic solution 1 drop (1 drop Both Eyes Not Given 12/22/20 1052)  albuterol (PROVENTIL) (2.5 MG/3ML) 0.083% nebulizer solution 2.5 mg (has no administration in time range)  levothyroxine (SYNTHROID) tablet 125 mcg (125 mcg Oral Not Given 12/22/20 1100)  mometasone-formoterol  (DULERA) 200-5 MCG/ACT inhaler 2 puff (2 puffs Inhalation Given 12/22/20 2057)  multivitamin with minerals tablet 1 tablet (0 tablets Oral Hold 12/22/20 1056)  cycloSPORINE (RESTASIS) 0.05 % ophthalmic emulsion 1 drop (1 drop Both Eyes Given 12/22/20 1058)  loratadine (CLARITIN) tablet 10 mg (10 mg Oral Given 12/22/20 1050)  isosorbide  mononitrate (IMDUR) 24 hr tablet 60 mg (60 mg Oral Given 12/22/20 1048)  hydrALAZINE (APRESOLINE) tablet 50 mg (50 mg Oral Given 12/22/20 1653)  amLODipine (NORVASC) tablet 5 mg (5 mg Oral Given 12/22/20 1050)  povidone-iodine (BETADINE) 10 % ointment (1 application Topical Given 12/22/20 1823)  acetaminophen (TYLENOL) tablet 1,000 mg (has no administration in time range)    Followed by  acetaminophen (TYLENOL) tablet 650 mg (has no administration in time range)    Mobility walks with device Moderate fall risk   Focused Assessments    R Recommendations: See Admitting Provider Note  Report given to:   Additional Notes:

## 2020-12-22 NOTE — Progress Notes (Signed)
Id brief note   Patient followed by ID and was seen 9/19 for worsening diabetic foot infection, with pseudomonas in cx  Mri 9/20 showed imaging suggestion of OM   -given this is chronic diabetic foot OM and very high rate of failure of pseudomonas treatment without proper debridement (source control) -please discuss with orthopedics and podiatry. She likely needs at least surgical debridement, but anticipate more proximal amputation would be more beneficial  -given no sepsis, would hold abx now to (if surgery debridement done) maximize bone/deep culture yield -discussed with Dr Florene Glen

## 2020-12-22 NOTE — ED Notes (Signed)
I cleaned pt's wound on feet with soap and water and applied betadine ointment as prescribed and a dry dressing w/kerlex to both feet.

## 2020-12-23 ENCOUNTER — Encounter (HOSPITAL_COMMUNITY): Payer: Medicare Other

## 2020-12-23 DIAGNOSIS — E11628 Type 2 diabetes mellitus with other skin complications: Secondary | ICD-10-CM | POA: Diagnosis not present

## 2020-12-23 DIAGNOSIS — L089 Local infection of the skin and subcutaneous tissue, unspecified: Secondary | ICD-10-CM | POA: Diagnosis not present

## 2020-12-23 DIAGNOSIS — Z8673 Personal history of transient ischemic attack (TIA), and cerebral infarction without residual deficits: Secondary | ICD-10-CM

## 2020-12-23 DIAGNOSIS — Z9862 Peripheral vascular angioplasty status: Secondary | ICD-10-CM

## 2020-12-23 DIAGNOSIS — E11621 Type 2 diabetes mellitus with foot ulcer: Secondary | ICD-10-CM

## 2020-12-23 DIAGNOSIS — I5032 Chronic diastolic (congestive) heart failure: Secondary | ICD-10-CM

## 2020-12-23 DIAGNOSIS — M86172 Other acute osteomyelitis, left ankle and foot: Secondary | ICD-10-CM

## 2020-12-23 DIAGNOSIS — Z7982 Long term (current) use of aspirin: Secondary | ICD-10-CM

## 2020-12-23 DIAGNOSIS — N183 Chronic kidney disease, stage 3 unspecified: Secondary | ICD-10-CM

## 2020-12-23 DIAGNOSIS — Z86718 Personal history of other venous thrombosis and embolism: Secondary | ICD-10-CM

## 2020-12-23 DIAGNOSIS — M86171 Other acute osteomyelitis, right ankle and foot: Secondary | ICD-10-CM

## 2020-12-23 DIAGNOSIS — I2510 Atherosclerotic heart disease of native coronary artery without angina pectoris: Secondary | ICD-10-CM

## 2020-12-23 DIAGNOSIS — I70245 Atherosclerosis of native arteries of left leg with ulceration of other part of foot: Secondary | ICD-10-CM

## 2020-12-23 DIAGNOSIS — Z87891 Personal history of nicotine dependence: Secondary | ICD-10-CM

## 2020-12-23 DIAGNOSIS — Z89411 Acquired absence of right great toe: Secondary | ICD-10-CM

## 2020-12-23 DIAGNOSIS — L97529 Non-pressure chronic ulcer of other part of left foot with unspecified severity: Secondary | ICD-10-CM

## 2020-12-23 DIAGNOSIS — Z823 Family history of stroke: Secondary | ICD-10-CM

## 2020-12-23 DIAGNOSIS — I110 Hypertensive heart disease with heart failure: Secondary | ICD-10-CM

## 2020-12-23 LAB — CBC WITH DIFFERENTIAL/PLATELET
Abs Immature Granulocytes: 0.01 10*3/uL (ref 0.00–0.07)
Basophils Absolute: 0 10*3/uL (ref 0.0–0.1)
Basophils Relative: 1 %
Eosinophils Absolute: 0.2 10*3/uL (ref 0.0–0.5)
Eosinophils Relative: 6 %
HCT: 26.2 % — ABNORMAL LOW (ref 36.0–46.0)
Hemoglobin: 8.8 g/dL — ABNORMAL LOW (ref 12.0–15.0)
Immature Granulocytes: 0 %
Lymphocytes Relative: 20 %
Lymphs Abs: 0.8 10*3/uL (ref 0.7–4.0)
MCH: 32.1 pg (ref 26.0–34.0)
MCHC: 33.6 g/dL (ref 30.0–36.0)
MCV: 95.6 fL (ref 80.0–100.0)
Monocytes Absolute: 0.9 10*3/uL (ref 0.1–1.0)
Monocytes Relative: 21 %
Neutro Abs: 2.2 10*3/uL (ref 1.7–7.7)
Neutrophils Relative %: 52 %
Platelets: 189 10*3/uL (ref 150–400)
RBC: 2.74 MIL/uL — ABNORMAL LOW (ref 3.87–5.11)
RDW: 17.5 % — ABNORMAL HIGH (ref 11.5–15.5)
WBC: 4.1 10*3/uL (ref 4.0–10.5)
nRBC: 0 % (ref 0.0–0.2)

## 2020-12-23 LAB — GLUCOSE, CAPILLARY
Glucose-Capillary: 118 mg/dL — ABNORMAL HIGH (ref 70–99)
Glucose-Capillary: 97 mg/dL (ref 70–99)
Glucose-Capillary: 115 mg/dL — ABNORMAL HIGH (ref 70–99)
Glucose-Capillary: 100 mg/dL — ABNORMAL HIGH (ref 70–99)
Glucose-Capillary: 130 mg/dL — ABNORMAL HIGH (ref 70–99)

## 2020-12-23 LAB — COMPREHENSIVE METABOLIC PANEL
ALT: 12 U/L (ref 0–44)
AST: 19 U/L (ref 15–41)
Albumin: 2.6 g/dL — ABNORMAL LOW (ref 3.5–5.0)
Alkaline Phosphatase: 79 U/L (ref 38–126)
Anion gap: 7 (ref 5–15)
BUN: 47 mg/dL — ABNORMAL HIGH (ref 8–23)
CO2: 22 mmol/L (ref 22–32)
Calcium: 9 mg/dL (ref 8.9–10.3)
Chloride: 108 mmol/L (ref 98–111)
Creatinine, Ser: 1.31 mg/dL — ABNORMAL HIGH (ref 0.44–1.00)
GFR, Estimated: 38 mL/min — ABNORMAL LOW (ref >60)
Glucose, Bld: 175 mg/dL — ABNORMAL HIGH (ref 70–99)
Potassium: 4.1 mmol/L (ref 3.5–5.1)
Sodium: 137 mmol/L (ref 135–145)
Total Bilirubin: 0.6 mg/dL (ref 0.3–1.2)
Total Protein: 6.4 g/dL — ABNORMAL LOW (ref 6.5–8.1)

## 2020-12-23 LAB — MAGNESIUM: Magnesium: 1.9 mg/dL (ref 1.7–2.4)

## 2020-12-23 LAB — PHOSPHORUS: Phosphorus: 2.7 mg/dL (ref 2.5–4.6)

## 2020-12-23 MED ORDER — OSMOLITE 1.2 CAL PO LIQD: 1000.0000 mL | ORAL | Status: DC

## 2020-12-23 MED ORDER — OSMOLITE 1.5 CAL PO LIQD
780.0000 mL | ORAL | Status: DC
  Administered 2020-12-23 – 2020-12-28 (×4): 780 mL
  Filled 2020-12-23 (×9): qty 948

## 2020-12-23 NOTE — Progress Notes (Addendum)
PROGRESS NOTE    Shannon [name]  WJX:[id number] DOB: [birth year] DOA: 12/21/2020 PCP: Sharon Seller, NP   No chief complaint on file.   Brief Narrative:  Shannon [name] 87 y.o. female with medical history significant of DM2, HTN, CKD 4, gastric stenosis s/p feeding tube.  She presented to the hospital after an episode of unresponsiveness.  She was brought to hospital by EMS.  She was complaining of bilateral foot pain.    She's being seen for bilateral lower extremity osteo.   Assessment & Plan:   Principal Problem:   Osteomyelitis of left foot (HCC) Active Problems:   Type 2 diabetes mellitus with chronic kidney disease (HCC)   Accelerated hypertension   Acute renal failure superimposed on stage 4 chronic kidney disease (HCC)   Chronic diastolic CHF (congestive heart failure) (HCC)   Osteomyelitis of right foot (HCC)   Diabetic infection of right foot (HCC)   Diabetic foot infection (HCC)  Bilateral Diabetic Foot Infections  Osteomyelitis MRI R foot notable for acute osteo of 3rd metatarsal, bone marrow edema at resection margin of 5th metatarsal, diffuse soft tissue swelling at the distal stump as well as the dorsum of the forefoot.   MRI L foot notable for dislocation and hallux valgus at the first MTP joint with boy erosion particularaly in the head of the first metatarsal medially, but also in the base of the proximal phalanx, with marrow edema in both of the structures - concerning for osteo and possible septic joint.  Transverse fx of head of 3rd metatarsa, ? Superimposed osteo.   R foot ulcer with enterococcus 8 days ago L foot ulcer with pseudomonas 3 months ago Follow [facility] Infectious disease consult, appreciate recs - recommending holding abx for now, recommending vascular eval regarding risk of debridement/healing.  Vascular c/s Podiatry c/s, appreciate recs - considering debridement/biopsy, possibly 9/23  PAD S/p L popliteal angioplasty 10/20/20 At that  point, recommending aspirin indefinitely, plavix x3 months.  Statin indefinitely.  Vascular c/s, appreciate assistance with PAD and above  AKI on CKD IIIB Baseline creatinine appears to be around 1.45 1.68 at presentation improving  HTN Continue home BP meds  T2DM A1c 5.5 last month Diet controlled Continue BG check  HFpEF Holding lasix given aki on presentation  Gastric stenosis s/p feeding tube Continue tube feeds   DVT prophylaxis: heparin Code Status: DNR Family Communication: none at bedside - daughter over phone 9/21, 9/22 Disposition:   Status is: Inpatient  Remains inpatient appropriate because:Inpatient level of care appropriate due to severity of illness  Dispo: The patient is from: Home              Anticipated d/c is to:  pending              Patient currently is not medically stable to d/c.   Difficult to place patient No       Consultants:  ID podiatry  Procedures:  none  Antimicrobials:  Anti-infectives (From admission, onward)    Start     Dose/Rate Route Frequency Ordered Stop   12/21/20 2200  piperacillin-tazobactam (ZOSYN) IVPB 3.375 g  Status:  Discontinued        3.375 g 12.5 mL/hr over 240 Minutes Intravenous Every 12 hours 12/21/20 2105 12/22/20 1157   12/21/20 2030  vancomycin (VANCOREADY) IVPB 1250 mg/250 mL  Status:  Discontinued        1,250 mg 166.7 mL/hr over 90 Minutes Intravenous  Once 12/21/20 2022 12/21/20  2035          Subjective: No complaints   Objective: Vitals:   12/23/20 0836 12/23/20 1000 12/23/20 1211 12/23/20 1436  BP:   (!) 169/65   Pulse: (!) 16  67   Resp:  17 17   Temp:   98.4 F (36.9 C)   TempSrc:   Oral   SpO2:   100%   Weight:    55.6 kg    Intake/Output Summary (Last 24 hours) at 12/23/2020 1832 Last data filed at 12/23/2020 0747 Gross per 24 hour  Intake 1439.51 ml  Output 650 ml  Net 789.51 ml   Filed Weights   12/23/20 1436  Weight: 55.6 kg    Examination:  General: No  acute distress. Cardiovascular:RRR Lungs: unlabored Abdomen: Soft, nontender, nondistended Neurological: Alert and oriented 3. Moves all extremities 4 with equal strength. Cranial nerves II through XII grossly intact. Extremities: bilateral lower extremities with intact dressings    Data Reviewed: I have personally reviewed following labs and imaging studies  CBC: Recent Labs  Lab 12/20/20 1443 12/21/20 1030 12/22/20 0340 12/23/20 0111  WBC 6.0 4.2 4.4 4.1  NEUTROABS  --  2.5  --  2.2  HGB 9.5* 9.2* 9.8* 8.8*  HCT 28.9* 28.0* 29.3* 26.2*  MCV 97.6 96.9 97.7 95.6  PLT 232 194 196 189    Basic Metabolic Panel: Recent Labs  Lab 12/20/20 1443 12/21/20 1030 12/22/20 0340 12/23/20 0111  NA 137 136 138 137  K 5.1 4.5 5.1 4.1  CL 101 102 106 108  CO2 27 24 21* 22  GLUCOSE 87 122* 70 175*  BUN 73* 70* 59* 47*  CREATININE 1.68* 1.65* 1.49* 1.31*  CALCIUM 9.9 9.4 10.0 9.0  MG  --   --   --  1.9  PHOS  --   --   --  2.7    GFR: Estimated Creatinine Clearance: 22.5 mL/min (Kamaal Cast) (by C-G formula based on SCr of 1.31 mg/dL (H)).  Liver Function Tests: Recent Labs  Lab 12/21/20 1030 12/23/20 0111  AST 20 19  ALT 14 12  ALKPHOS 76 79  BILITOT 0.4 0.6  PROT 6.9 6.4*  ALBUMIN 3.0* 2.6*    CBG: Recent Labs  Lab 12/22/20 1320 12/22/20 2123 12/23/20 0658 12/23/20 1213 12/23/20 1604  GLUCAP 115* 143* 118* 97 115*     Recent Results (from the past 240 hour(s))  Blood culture (routine x 2)     Status: None (Preliminary result)   Collection Time: 12/21/20 10:29 AM   Specimen: BLOOD  Result Value Ref Range Status   Specimen Description BLOOD RIGHT ANTECUBITAL  Final   Special Requests   Final    BOTTLES DRAWN AEROBIC ONLY Blood Culture results may not be optimal due to an inadequate volume of blood received in culture bottles   Culture   Final    NO GROWTH 2 DAYS Performed at [facility], [address], [place], [place] [address]    Report Status  PENDING  Incomplete  Blood culture (routine x 2)     Status: None (Preliminary result)   Collection Time: 12/21/20 10:34 AM   Specimen: BLOOD  Result Value Ref Range Status   Specimen Description BLOOD SITE NOT SPECIFIED  Final   Special Requests   Final    BOTTLES DRAWN AEROBIC ONLY Blood Culture results may not be optimal due to an inadequate volume of blood received in culture bottles   Culture   Final  NO GROWTH 2 DAYS Performed at [facility], [address] [place], [place] [address]    Report Status PENDING  Incomplete  Resp Panel by RT-PCR (Flu Tayonna Bacha&B, Covid) Urine, Clean Catch     Status: None   Collection Time: 12/21/20  8:34 PM   Specimen: Urine, Clean Catch; Nasopharyngeal(NP) swabs in vial transport medium  Result Value Ref Range Status   SARS Coronavirus 2 by RT PCR NEGATIVE NEGATIVE Final    Comment: (NOTE) SARS-CoV-2 target nucleic acids are NOT DETECTED.  The SARS-CoV-2 RNA is generally detectable in upper respiratory specimens during the acute phase of infection. The lowest concentration of SARS-CoV-2 viral copies this assay can detect is 138 copies/mL. Shannon [name] negative result does not preclude SARS-Cov-2 infection and should not be used as the sole basis for treatment or other patient management decisions. Shannon [name] negative result may occur with  improper specimen collection/handling, submission of specimen other than nasopharyngeal swab, presence of viral mutation(s) within the areas targeted by this assay, and inadequate number of viral copies(<138 copies/mL). Shannon [name] negative result must be combined with clinical observations, patient history, and epidemiological information. The expected result is Negative.  Fact Sheet for Patients:  BloggerCourse.com  Fact Sheet for Healthcare Providers:  SeriousBroker.it  This test is no t yet approved or cleared by the [place] FDA and  has been authorized for detection  and/or diagnosis of SARS-CoV-2 by FDA under an Emergency Use Authorization (EUA). This EUA will remain  in effect (meaning this test can be used) for the duration of the COVID-19 declaration under Section 564(b)(1) of the Act, 21 U.S.C.section 360bbb-3(b)(1), unless the authorization is terminated  or revoked sooner.       Influenza Ariane Ditullio by PCR NEGATIVE NEGATIVE Final   Influenza B by PCR NEGATIVE NEGATIVE Final    Comment: (NOTE) The Xpert Xpress SARS-CoV-2/FLU/RSV plus assay is intended as an aid in the diagnosis of influenza from Nasopharyngeal swab specimens and should not be used as Shannon [name] sole basis for treatment. Nasal washings and aspirates are unacceptable for Xpert Xpress SARS-CoV-2/FLU/RSV testing.  Fact Sheet for Patients: BloggerCourse.com  Fact Sheet for Healthcare Providers: SeriousBroker.it  This test is not yet approved or cleared by the [place] FDA and has been authorized for detection and/or diagnosis of SARS-CoV-2 by FDA under an Emergency Use Authorization (EUA). This EUA will remain in effect (meaning this test can be used) for the duration of the COVID-19 declaration under Section 564(b)(1) of the Act, 21 U.S.C. section 360bbb-3(b)(1), unless the authorization is terminated or revoked.  Performed at [facility], [address], [place], [place] [address]   MRSA Next Gen by PCR, Nasal     Status: None   Collection Time: 12/22/20  1:00 AM   Specimen: Nasal Mucosa; Nasal Swab  Result Value Ref Range Status   MRSA by PCR Next Gen NOT DETECTED NOT DETECTED Final    Comment: (NOTE) The GeneXpert MRSA Assay (FDA approved for NASAL specimens only), is one component of Analeigh Aries comprehensive MRSA colonization surveillance program. It is not intended to diagnose MRSA infection nor to guide or monitor treatment for MRSA infections. Test performance is not FDA approved in patients less than 12 years old. Performed  at [facility], [address] [place], [place] [address]          Radiology Studies: No results found.      Scheduled Meds:  acetaminophen  1,000 mg Oral Q8H   amLODipine  5 mg Oral Daily   artificial tears  1 application Both Eyes QHS   aspirin EC  81 mg Oral Daily   atorvastatin  10 mg Oral Daily   clopidogrel  75 mg Oral Daily   cycloSPORINE  1 drop Both Eyes BID   DULoxetine  30 mg Oral Daily   ferrous sulfate  325 mg Oral Daily   fluticasone  2 spray Each Nare Daily   free water  180 mL Per Tube TID   gabapentin  300 mg Oral BID   heparin  5,000 Units Subcutaneous Q8H   hydrALAZINE  50 mg Oral TID   isosorbide mononitrate  60 mg Oral Daily   latanoprost  1 drop Both Eyes QHS   levothyroxine  125 mcg Oral QAC breakfast   loratadine  10 mg Oral Daily   mometasone-formoterol  2 puff Inhalation BID   multivitamin with minerals  1 tablet Oral Daily   nystatin cream  1 application Topical BID   pantoprazole  40 mg Oral BID   povidone-iodine   Topical Q1400   senna-docusate  1 tablet Oral QHS   Continuous Infusions:  sodium chloride 100 mL/hr at 12/23/20 0616   feeding supplement (OSMOLITE 1.5 CAL)       LOS: 2 days    Time spent: over 30 min    Lacretia Nicks, MD [facility]   To contact the attending provider between 7A-7P or the covering provider during after hours 7P-7A, please log into the web site [email or web address] and access using universal Ernest password for that web site. If you do not have the password, please call the hospital operator.  12/23/2020, 6:32 PM

## 2020-12-23 NOTE — Consult Note (Signed)
[facility]    Date of Admission:  12/21/2020     Total days of antibiotics 10               Reason for Consult: Osteomyelitis    Referring Provider: Dr. Florene Glen Primary Care Provider: [name], NP   ASSESSMENT:  Shannon [name] is a 87 y/o AA female with bilateral diabetic foot ulcers with MRI with concern for bilateral osteomyelitis. Podiatry recommended more proximal amputation of bilateral feet. Wound cultures have grown pan-sensitive Pseudomonas aeruginosa and ampicillin sensitive Enterobacter faecalis. Agree that surgical intervention would be ideal as antibiotics are unlikely to be able to heal this alone however there is concern that her vascular flow and nutritional intake may not be sufficient to support healing. Was seen in 10/2020 by Vascular Surgery with potential for difficult wound healing. Have discussed with Shannon [name] and her family concern for ability to heal after surgery. Recommend discussing surgery with Vascular Surgery to ensure adequate blood flow. Will hold antibiotics at this time in attempts to maximize cultures should surgery proceed. If no surgical intervention will restart antibiotics.   PLAN:  Hold antibiotics for potential surgical cultures.  Recommend Vascular Surgery consult to ensure adequate circulation for healing.  Optimize nutrition as able. ID will continue to follow.    Principal Problem:   Osteomyelitis of left foot (HCC) Active Problems:   Type 2 diabetes mellitus with chronic kidney disease (HCC)   Accelerated hypertension   Acute renal failure superimposed on stage 4 chronic kidney disease (HCC)   Chronic diastolic CHF (congestive heart failure) (HCC)   Osteomyelitis of right foot (HCC)   Diabetic infection of right foot (HCC)   Diabetic infection of left foot (HCC)    acetaminophen  1,000 mg Oral Q8H   amLODipine  5 mg Oral Daily   artificial tears  1 application Both Eyes QHS   aspirin EC  81 mg  Oral Daily   atorvastatin  10 mg Oral Daily   clopidogrel  75 mg Oral Daily   cycloSPORINE  1 drop Both Eyes BID   DULoxetine  30 mg Oral Daily   ferrous sulfate  325 mg Oral Daily   fluticasone  2 spray Each Nare Daily   free water  180 mL Per Tube TID   gabapentin  300 mg Oral BID   heparin  5,000 Units Subcutaneous Q8H   hydrALAZINE  50 mg Oral TID   isosorbide mononitrate  60 mg Oral Daily   latanoprost  1 drop Both Eyes QHS   levothyroxine  125 mcg Oral QAC breakfast   loratadine  10 mg Oral Daily   mometasone-formoterol  2 puff Inhalation BID   multivitamin with minerals  1 tablet Oral Daily   nystatin cream  1 application Topical BID   pantoprazole  40 mg Oral BID   povidone-iodine   Topical Q1400   senna-docusate  1 tablet Oral QHS     HPI: Shannon [name] is a 87 y.o. female with previous medical history of chronic diastolic heart failure, chronic kidney disease Stage III, hypertension, peripheral artery disease complicated by prior gangrene, toe amputations s/p intervention, and Type 2 diabetes admitted with altered level of consciousness.  Shannon [name] is known to the ID service and was last seen by Dr. West Bali on 12/20/20 for diabetic ulcer of the right toe with necrosis.Wound started in the right transmetatarsal site in August 2021 and has failed to heal with  waxing and waning drainage. She also had a wound on the left medial great toe that was present for 3-4 months. Had been followed by Dr. Amalia Hailey with podiatry who expressed concern for osteomyelitis of her bilateral feet with recommendation for more proximal amputation of bilateral feet. Wound culture from 12/13/20 growing Ampicillin sensitive Enterococcus faecalis. Started on doxycycline by Dr. Amalia Hailey. Of note previous cultures on 08/27/20 and 09/22/20 with pan sensitive Pseudomonas aeruginosa. Augmentin and levofloxacin were added.  Shannon [name] was brought to the ED on 12/21/20 with altered level of consciousness with baseline being  alert and conversive and increased bilateral foot pain. MRI right foot with acute osteomyelitis of the right third metatarsal; bone marrow edema at the resection margin of the fifth metatarsal concerning for acute osteomyelitis; and diffuse soft tissue swelling at the distal stump of the dorsum of the forefoot with no organized fluid collections. MRI left foot with bony erosion in the head of the first metatarsal medially concerning for osteomyelitis and possibly septic joint; and transverse fracture of the head of the third metatarsal with possibility of superimposed osteomyelitis.  Shannon [name] has been afebrile since admission with no leukocytosis. Antibiotics were changed from doxycycline/Augmentin/levofloxacin to piperacillin-tazobactam. Blood cultures have been without growth to date.  Shannon [name] grand-daughter is present during our discussion and provides history. Her daughter arrived towards the end of our visit.    Review of Systems: Review of Systems  Constitutional:  Negative for chills, fever and weight loss.  Respiratory:  Negative for cough, shortness of breath and wheezing.   Cardiovascular:  Negative for chest pain and leg swelling.  Gastrointestinal:  Negative for abdominal pain, constipation, diarrhea, nausea and vomiting.  Skin:  Negative for rash.    Past Medical History:  Diagnosis Date   Acute respiratory failure ([place])    Anemia    previous blood transfusions   Arthritis    "all over"   Asthma    Atrial flutter (HCC)    Bradycardia    requiring previous d/c of BB and reduction of amiodarone   CAD (coronary artery disease)    nonobstructive per notes   Chronic diastolic CHF (congestive heart failure) ([place])    CKD (chronic kidney disease), stage III ([place])    Complication of blood transfusion    "got the wrong blood type at [facility] in ~ 2015; no adverse reaction that we are aware of"/daughter, [name] (01/27/2016)   COPD (chronic obstructive pulmonary disease) ([place])     Depression    "light case"   DVT (deep venous thrombosis) ([place]) 01/2016   a. LLE DVT 01/2016 - switched from Eliquis to Coumadin.   Dyspnea    with some activity   Gastric stenosis    a. s/p stomach tube   GERD (gastroesophageal reflux disease)    Headache    History of 2019 novel coronavirus disease (COVID-19)    History of blood transfusion    "several" (01/27/2016)   History of stomach ulcers    Hyperlipidemia    Hypertension    Hypothyroidism    On home oxygen therapy    "2L; 9PM til 9AM" (06/27/2017)   PAD (peripheral artery disease) (HCC)    a. prior gangrene, toe amputations, intervention.   PAF (paroxysmal atrial fibrillation) ([place])    Paraesophageal hernia    Perforated gastric ulcer ([place])    Pneumonia    "a few times" (06/27/2017)   Seasonal allergies    SIADH (syndrome of inappropriate ADH production) ([place])    /  notes 01/10/2015   Small bowel obstruction ([place])    "I don't know how many" (01/11/2015)   Stroke [facility])    several- no residual   Type II diabetes mellitus ([place])    "related to prednisone use  for > 20 yr; once predinose stopped; no more DM RX" (01/27/2016)   UTI (urinary tract infection) 02/08/2016   Ventral hernia with bowel obstruction     Social History   Tobacco Use   Smoking status: Never   Smokeless tobacco: Former    Types: Snuff    Quit date: 04/03/1980   Tobacco comments:    "used snuff in my younger days"  Vaping Use   Vaping Use: Never used  Substance Use Topics   Alcohol use: Never    Alcohol/week: 0.0 standard drinks   Drug use: Never    Family History  Problem Relation Age of Onset   Stroke Mother    Hypertension Mother    Diabetes Brother    Glaucoma Son    Glaucoma Son    Heart attack Neg Hx    Colon cancer Neg Hx    Stomach cancer Neg Hx     Allergies  Allergen Reactions   Penicillins Itching, Rash and Other (See Comments)    Tolerated amoxicillin, unasyn, zosyn & cephalosporins in the past. Did it involve  swelling of the face/tongue/throat, SOB, or low BP? No Did it involve sudden or severe rash/hives, skin peeling, or any reaction on the inside of your mouth or nose? No Did you need to seek medical attention at a hospital or doctor's office? Unknown When did it last happen?      5+ years If all above answers are "NO", may proceed with cephalosporin use.     OBJECTIVE: Blood pressure (!) 169/65, pulse 67, temperature 98.4 F (36.9 C), temperature source Oral, resp. rate 17, SpO2 100 %.  Physical Exam Constitutional:      General: She is not in acute distress.    Appearance: She is well-developed.     Comments: Lying in bed with head of bed elevated; pleasant.   Cardiovascular:     Rate and Rhythm: Normal rate and regular rhythm.     Heart sounds: Normal heart sounds.  Pulmonary:     Effort: Pulmonary effort is normal.     Breath sounds: Normal breath sounds.  Skin:    General: Skin is warm and dry.     Comments: Bilateral feet in with dressings in place.   Neurological:     Mental Status: She is alert.    Lab Results Lab Results  Component Value Date   WBC 4.1 12/23/2020   HGB 8.8 (L) 12/23/2020   HCT 26.2 (L) 12/23/2020   MCV 95.6 12/23/2020   PLT 189 12/23/2020    Lab Results  Component Value Date   CREATININE 1.31 (H) 12/23/2020   BUN 47 (H) 12/23/2020   NA 137 12/23/2020   K 4.1 12/23/2020   CL 108 12/23/2020   CO2 22 12/23/2020    Lab Results  Component Value Date   ALT 12 12/23/2020   AST 19 12/23/2020   ALKPHOS 79 12/23/2020   BILITOT 0.6 12/23/2020     Microbiology: Recent Results (from the past 240 hour(s))  WOUND CULTURE     Status: Abnormal   Collection Time: 12/13/20  3:00 PM  Result Value Ref Range Status   MICRO NUMBER: [id number]  Final   SPECIMEN QUALITY: Adequate  Final   SOURCE: RT  FOOT ULCER  Final   STATUS: FINAL  Final   GRAM STAIN:   Final    No epithelial cells seen No white blood cells seen Many Gram negative bacilli   ISOLATE 1:  Enterococcus faecalis (A)  Final    Comment: Heavy growth of Enterococcus faecalis      Susceptibility   Enterococcus faecalis - AEROBIC CULT, GRAM STAIN POSITIVE 1    AMPICILLIN <=2 Sensitive     VANCOMYCIN* 1 Sensitive      * Legend: S = Susceptible  I = Intermediate R = Resistant  NS = Not susceptible * = Not tested  NR = Not reported **NN = See antimicrobic comments   Blood culture (routine x 2)     Status: None (Preliminary result)   Collection Time: 12/21/20 10:29 AM   Specimen: BLOOD  Result Value Ref Range Status   Specimen Description BLOOD RIGHT ANTECUBITAL  Final   Special Requests   Final    BOTTLES DRAWN AEROBIC ONLY Blood Culture results may not be optimal due to an inadequate volume of blood received in culture bottles   Culture   Final    NO GROWTH 2 DAYS Performed at [facility], [address] [place], [place] [address]    Report Status PENDING  Incomplete  Blood culture (routine x 2)     Status: None (Preliminary result)   Collection Time: 12/21/20 10:34 AM   Specimen: BLOOD  Result Value Ref Range Status   Specimen Description BLOOD SITE NOT SPECIFIED  Final   Special Requests   Final    BOTTLES DRAWN AEROBIC ONLY Blood Culture results may not be optimal due to an inadequate volume of blood received in culture bottles   Culture   Final    NO GROWTH 2 DAYS Performed at [facility], [place] [address], [place], [place] [address]    Report Status PENDING  Incomplete  Resp Panel by RT-PCR (Flu A&B, Covid) Urine, Clean Catch     Status: None   Collection Time: 12/21/20  8:34 PM   Specimen: Urine, Clean Catch; Nasopharyngeal(NP) swabs in vial transport medium  Result Value Ref Range Status   SARS Coronavirus 2 by RT PCR NEGATIVE NEGATIVE Final    Comment: (NOTE) SARS-CoV-2 target nucleic acids are NOT DETECTED.  The SARS-CoV-2 RNA is generally detectable in upper respiratory specimens during the acute phase of infection. The lowest concentration  of SARS-CoV-2 viral copies this assay can detect is 138 copies/mL. A negative result does not preclude SARS-Cov-2 infection and should not be used as the sole basis for treatment or other patient management decisions. A negative result may occur with  improper specimen collection/handling, submission of specimen other than nasopharyngeal swab, presence of viral mutation(s) within the areas targeted by this assay, and inadequate number of viral copies(<138 copies/mL). A negative result must be combined with clinical observations, patient history, and epidemiological information. The expected result is Negative.  Fact Sheet for Patients:  EntrepreneurPulse.com.au  Fact Sheet for Healthcare Providers:  IncredibleEmployment.be  This test is no t yet approved or cleared by the [place] FDA and  has been authorized for detection and/or diagnosis of SARS-CoV-2 by FDA under an Emergency Use Authorization (EUA). This EUA will remain  in effect (meaning this test can be used) for the duration of the COVID-19 declaration under Section 564(b)(1) of the Act, 21 U.S.C.section 360bbb-3(b)(1), unless the authorization is terminated  or revoked sooner.  Influenza A by PCR NEGATIVE NEGATIVE Final   Influenza B by PCR NEGATIVE NEGATIVE Final    Comment: (NOTE) The Xpert Xpress SARS-CoV-2/FLU/RSV plus assay is intended as an aid in the diagnosis of influenza from Nasopharyngeal swab specimens and should not be used as a sole basis for treatment. Nasal washings and aspirates are unacceptable for Xpert Xpress SARS-CoV-2/FLU/RSV testing.  Fact Sheet for Patients: EntrepreneurPulse.com.au  Fact Sheet for Healthcare Providers: IncredibleEmployment.be  This test is not yet approved or cleared by the [place] FDA and has been authorized for detection and/or diagnosis of SARS-CoV-2 by FDA under an Emergency Use  Authorization (EUA). This EUA will remain in effect (meaning this test can be used) for the duration of the COVID-19 declaration under Section 564(b)(1) of the Act, 21 U.S.C. section 360bbb-3(b)(1), unless the authorization is terminated or revoked.  Performed at [facility], [place] [address], [place], [place] [address]   MRSA Next Gen by PCR, Nasal     Status: None   Collection Time: 12/22/20  1:00 AM   Specimen: Nasal Mucosa; Nasal Swab  Result Value Ref Range Status   MRSA by PCR Next Gen NOT DETECTED NOT DETECTED Final    Comment: (NOTE) The GeneXpert MRSA Assay (FDA approved for NASAL specimens only), is one component of a comprehensive MRSA colonization surveillance program. It is not intended to diagnose MRSA infection nor to guide or monitor treatment for MRSA infections. Test performance is not FDA approved in patients less than 68 years old. Performed at [facility], [place] [address], [place], [place] [address]      Terri Piedra, [facility]  12/23/2020  2:13 PM

## 2020-12-23 NOTE — Progress Notes (Signed)
Initial Nutrition Assessment  DOCUMENTATION CODES:   Non-severe (moderate) malnutrition in context of chronic illness  INTERVENTION:   -Full liquid diet; RD to order meals per pt preferences -Nocturnal TF per home regimen:  Initiate Osmolite 1.5 @ 65 ml/hr via j-port over 12 hour period (2100-9000)  180 ml free water flush TID  Tube feeding regimen provides 1170 kcal (81% of needs), 49 grams of protein, and 594 ml of H2O.  Total free water: 1134 ml daily  NUTRITION DIAGNOSIS:   Moderate Malnutrition related to chronic illness (gastric stenosis) as evidenced by mild fat depletion, moderate fat depletion, mild muscle depletion, moderate muscle depletion.  GOAL:   Patient will meet greater than or equal to 90% of their needs  MONITOR:   PO intake, Labs, Weight trends, TF tolerance, Skin  REASON FOR ASSESSMENT:   Consult Enteral/tube feeding initiation and management, Wound healing  ASSESSMENT:   Shannon [name] is a 87 y.o. female with medical history significant of DM2, HTN, CKD 4, gastric stenosis s/p feeding tube.  She presented to the hospital after an episode of unresponsiveness.  She was brought to hospital by EMS.  She was complaining of bilateral foot pain.  Pt admitted with bilateral infected foot ulcers.   Reviewed I/O's: +1.4 L x 24 hours and +1.5 L since admission  UOP: 650 ml x 24 hours  Spoke with pt at bedside, who was pleasant and in good spirits at time of visit. She reports that she has had chronic ulcers on her feet and is followed by podiatry. Per her report, she has daily dressing changes that her daughter assists with. Per her report, plan is for vascular surgery to evaluate pt prior to performing any surgical intervention.   Pt shares that she follows a liquid diet at home, which consists mainly of broth, cream soups, jello, ice pops, yogurt, and ice cream. She receives most of her nutrition via j-tube. Her home TF regimen is confirmed of Osmolite 1.5 @  65 ml/hr (2100-9000) with 180 ml free water flush TID.   Pt reports that she has had going weight loss over the years, but weight is now stable. Reviewed wt hx; pt has experienced a 4.3% wt loss over the past 3 months, which is not significant for time frame.   Case discussed with RN and Dr. Florene Glen; plan to resume diet and TF today, due to no plans for surgery today. Received permission to advance diet to full liquids, so pt could receive preferred food items. RD personally ordered meals for pt in accordance to her preferences.    Medications reviewed and include 0.9% sodium chloride infusion @ 100 ml/hr and senokot.   Labs reviewed: CBGS: 74-143 (inpatient orders for glycemic control are none).    NUTRITION - FOCUSED PHYSICAL EXAM:  Flowsheet Row Most Recent Value  Orbital Region Mild depletion  Upper Arm Region Mild depletion  Thoracic and Lumbar Region No depletion  Buccal Region Mild depletion  Temple Region Mild depletion  Clavicle Bone Region Mild depletion  Clavicle and Acromion Bone Region Mild depletion  Scapular Bone Region Mild depletion  Dorsal Hand Moderate depletion  Patellar Region Moderate depletion  Anterior Thigh Region Moderate depletion  Posterior Calf Region Moderate depletion  Edema (RD Assessment) None  Hair Reviewed  Eyes Reviewed  Mouth Reviewed  Skin Reviewed  Nails Reviewed       Diet Order:   Diet Order             Diet  full liquid Room service appropriate? Yes with Assist; Fluid consistency: Thin  Diet effective now                   EDUCATION NEEDS:   Education needs have been addressed  Skin:  Skin Assessment: Reviewed RN Assessment  Last BM:  Unknown  Height:   Ht Readings from Last 1 Encounters:  11/16/20 5\' 6"  (1.676 m)    Weight:   Wt Readings from Last 1 Encounters:  12/23/20 55.6 kg    Ideal Body Weight:  59.1 kg  BMI:  Body mass index is 19.78 kg/m.  Estimated Nutritional Needs:   Kcal:   1450-1650  Protein:  65-80 grams  Fluid:  > 1.4 L    Loistine Chance, RD, LDN, [place] Registered Dietitian II Certified Diabetes Care and Education Specialist Please refer to [facility] for RD and/or RD on-call/weekend/after hours pager

## 2020-12-23 NOTE — Plan of Care (Signed)

## 2020-12-23 NOTE — Progress Notes (Signed)
Subjective: 87 year old female with past medical history significant for type 2 diabetes, hypertension, CKD stage IV, gastric stenosis status post feeding tube presented to the hospital after episode of unresponsiveness.  She has had a history of a right transmetatarsal potation she has had chronic ulcerations.  She has been evaluated by vascular surgery as well.  MRIs did reveal osteomyelitis of bilateral feet.  The patient is not open to amputation at this time.  Vascular surgery has been consulted to evaluate the circulation.  She was originally scheduled for surgery today for debridement, biopsy however this has been put on hold until vascular surgery evaluation.  Objective: AAO x3, NAD-minimal pain to her feet she reports. Pulses decreased.   Overall exam appears to be unchanged.  Ulcerations present some of tarsal stumps of the right third and fifth To the fourth.  Small mount of purulence identified from the wound on the third metatarsal remnant on the right foot.  Wounds are full-thickness.  On the left foot there is a granular wound present along the medial first MPJ which appears to be somewhat improved in size.  There is no surrounding erythema, ascending cellulitis.  No drainage or pus.  No pain with calf compression, swelling, warmth, erythema  Assessment: Ulcerations bilateral with osteomyelitis; PAD  Plan: Dressing was changed today.  I discussed with the patient regards to treatment plan I also discussed with patient's daughter via the phone.  At this point I do still think that biopsy, culture would be beneficial however the foot appears to be stable.  Does not appear that it was circulation of the right foot and I would like to hold off on surgery until after vascular surgery optimizes the right lower extremity.  The patient's daughter is in agreement to this.  Podiatry will continue to follow.  Celesta Gentile, DPM

## 2020-12-23 NOTE — Consult Note (Signed)
[facility] [place]  ASSESSMENT / PLAN: Shannon [name] is a 87 y.o. female with atherosclerosis of native arteries of bilateral lower extremities causing ulceration.  She underwent left popliteal angioplasty 10/20/20.  This was complicated by hypotension and hematoma which required common femoral exploration.  Thankfully she has healed her right groin incision.  Recommend the following which can slow the progression of atherosclerosis and reduce the risk of major adverse cardiac / limb events:  Complete cessation from all tobacco products. Blood glucose control with goal A1c < 7%. Blood pressure control with goal blood pressure < 140/90 mmHg. Lipid reduction therapy with goal LDL-C <100 mg/dL (<36 if symptomatic from PAD).  Aspirin 81mg  PO QD.  Atorvastatin 40-80mg  PO QD (or other "high intensity" statin therapy).  She is optimized in the left lower extremity from a vascular standpoint.  Her left foot ulcer appears improved, but there is MRI evidence of osteomyelitis, which will need to be excised.   I did not evaluate the right lower extremity during my angiogram in July. We will plan a right lower extremity angiogram as soon as possible.  I may have time to do this tomorrow.  Will discuss with her and the family in the morning.  CHIEF COMPLAINT: Ulceration about the left great toe  HISTORY OF PRESENT ILLNESS: Shannon [name] is a 87 y.o. female known to our practice who is referred from her podiatrist for evaluation of ulceration about her left great toe.  The patient is ambulatory, and able to get around her house.  She is fairly independent.  She has no ischemic rest pain or claudication-like symptoms in her lower extremities, however.  She had critical limb ischemia of her right lower extremity in the past which was treated endovascularly by our group.  While she healed a transmetatarsal amputation on that side she now has 2 areas of ulceration on the plantar aspect of  her right foot.  12/23/20: Inpatient consultation requested by internal medicine service.  Patient has not undergone any left foot surgery for ulceration.  Vascular surgery consultation was requested to comment on adequacy of circulation and potential for healing.  Continues to have ulceration about her her bilateral feet.  Both appear improved, compared to July.  The patient has pain in her feet.  She has no other complaints.  VASCULAR SURGICAL HISTORY: 10/20/20: Right groin exploration for bleeding 10/20/20 - left popliteal angioplasty 09/21/16 - left superficial femoral artery angioplasty 09/25/16 - left second and third toe amputation 06/27/17 - right femoral-popliteal angioplasty, tibioperoneal trunk angioplasty 05/03/18 - right great toe amputation  Past Medical History:  Diagnosis Date   Acute respiratory failure (HCC)    Anemia    previous blood transfusions   Arthritis    "all over"   Asthma    Atrial flutter (HCC)    Bradycardia    requiring previous d/c of BB and reduction of amiodarone   CAD (coronary artery disease)    nonobstructive per notes   Chronic diastolic CHF (congestive heart failure) (HCC)    CKD (chronic kidney disease), stage III (HCC)    Complication of blood transfusion    "got the wrong blood type at [facility] in ~ 2015; no adverse reaction that we are aware of"/daughter, Shannon [name] (01/27/2016)   COPD (chronic obstructive pulmonary disease) (HCC)    Depression    "light case"   DVT (deep venous thrombosis) (HCC) 01/2016   a. LLE DVT 01/2016 - switched from Eliquis to Coumadin.  Dyspnea    with some activity   Gastric stenosis    a. s/p stomach tube   GERD (gastroesophageal reflux disease)    Headache    History of 2019 novel coronavirus disease (COVID-19)    History of blood transfusion    "several" (01/27/2016)   History of stomach ulcers    Hyperlipidemia    Hypertension    Hypothyroidism    On home oxygen therapy    "2L; 9PM til 9AM" (06/27/2017)    PAD (peripheral artery disease) (HCC)    a. prior gangrene, toe amputations, intervention.   PAF (paroxysmal atrial fibrillation) (HCC)    Paraesophageal hernia    Perforated gastric ulcer (HCC)    Pneumonia    "a few times" (06/27/2017)   Seasonal allergies    SIADH (syndrome of inappropriate ADH production) (HCC)    [name] 01/10/2015   Small bowel obstruction (HCC)    "I don't know how many" (01/11/2015)   Stroke (HCC)    several- no residual   Type II diabetes mellitus (HCC)    "related to prednisone use  for > 20 yr; once predinose stopped; no more DM RX" (01/27/2016)   UTI (urinary tract infection) 02/08/2016   Ventral hernia with bowel obstruction     Past Surgical History:  Procedure Laterality Date   ABDOMINAL AORTOGRAM N/A 09/21/2016   Procedure: Abdominal Aortogram;  Surgeon: Maeola Harman, MD;  Location: [facility] INVASIVE CV LAB;  Service: Cardiovascular;  Laterality: N/A;   ABDOMINAL AORTOGRAM W/LOWER EXTREMITY N/A 10/20/2020   Procedure: ABDOMINAL AORTOGRAM W/LOWER EXTREMITY;  Surgeon: Leonie Douglas, MD;  Location: [facility] INVASIVE CV LAB;  Service: Cardiovascular;  Laterality: N/A;   AMPUTATION Left 09/25/2016   Procedure: AMPUTATION DIGIT- LEFT 2ND AND 3RD TOES;  Surgeon: Larina Earthly, MD;  Location: [facility] OR;  Service: Vascular;  Laterality: Left;   AMPUTATION Right 05/03/2018   Procedure: AMPUTATION RIGHT GREAT TOE;  Surgeon: Larina Earthly, MD;  Location: [facility] OR;  Service: Vascular;  Laterality: Right;   CATARACT EXTRACTION W/ INTRAOCULAR LENS  IMPLANT, BILATERAL     CHOLECYSTECTOMY OPEN     COLECTOMY     ESOPHAGOGASTRODUODENOSCOPY N/A 01/19/2014   Procedure: ESOPHAGOGASTRODUODENOSCOPY (EGD);  Surgeon: Hilarie Fredrickson, MD;  Location: [facility] ENDOSCOPY;  Service: Endoscopy;  Laterality: N/A;   ESOPHAGOGASTRODUODENOSCOPY N/A 01/20/2014   Procedure: ESOPHAGOGASTRODUODENOSCOPY (EGD);  Surgeon: Hilarie Fredrickson, MD;  Location: [facility] ENDOSCOPY;  Service: Endoscopy;  Laterality: N/A;    ESOPHAGOGASTRODUODENOSCOPY N/A 03/19/2014   Procedure: ESOPHAGOGASTRODUODENOSCOPY (EGD);  Surgeon: Rachael Fee, MD;  Location: [facility] ENDOSCOPY;  Service: Endoscopy;  Laterality: N/A;   ESOPHAGOGASTRODUODENOSCOPY N/A 07/08/2015   Procedure: ESOPHAGOGASTRODUODENOSCOPY (EGD);  Surgeon: Sherrilyn Rist, MD;  Location: [facility] ENDOSCOPY;  Service: Endoscopy;  Laterality: N/A;   ESOPHAGOGASTRODUODENOSCOPY (EGD) WITH PROPOFOL N/A 09/15/2015   Procedure: ESOPHAGOGASTRODUODENOSCOPY (EGD) WITH PROPOFOL;  Surgeon: Ruffin Frederick, MD;  Location: [facility] ENDOSCOPY;  Service: Gastroenterology;  Laterality: N/A;   ESOPHAGOGASTRODUODENOSCOPY (EGD) WITH PROPOFOL N/A 10/12/2020   Procedure: ESOPHAGOGASTRODUODENOSCOPY (EGD) WITH PROPOFOL;  Surgeon: Iva Boop, MD;  Location: [facility] ENDOSCOPY;  Service: Endoscopy;  Laterality: N/A;   GASTROJEJUNOSTOMY     hx/notes 01/10/2015   GASTROJEJUNOSTOMY N/A 09/23/2015   Procedure: OPEN GASTROJEJUNOSTOMY TUBE PLACEMENT;  Surgeon: De Blanch Kinsinger, MD;  Location: [facility] ORS;  Service: General;  Laterality: N/A;   GLAUCOMA SURGERY Bilateral    HEMATOMA EVACUATION Right 10/20/2020   Procedure: EVACUATION HEMATOMA RIGHT GROIN;  Surgeon: Leonie Douglas, MD;  Location: [facility] OR;  Service: Vascular;  Laterality: Right;   IR CM INJ ANY COLONIC TUBE W/FLUORO  01/05/2017   IR CM INJ ANY COLONIC TUBE W/FLUORO  06/07/2017   IR CM INJ ANY COLONIC TUBE W/FLUORO  11/05/2017   IR CM INJ ANY COLONIC TUBE W/FLUORO  09/18/2018   IR CM INJ ANY COLONIC TUBE W/FLUORO  03/06/2019   IR CM INJ ANY COLONIC TUBE W/FLUORO  03/11/2019   IR GASTR TUBE CONVERT GASTR-JEJ PER W/FL MOD SED  04/30/2020   IR GENERIC HISTORICAL  01/07/2016   IR GJ TUBE CHANGE 01/07/2016 Malachy Moan, MD [facility]   IR GENERIC HISTORICAL  01/27/2016   IR MECH REMOV OBSTRUC MAT ANY COLON TUBE W/FLUORO 01/27/2016 Richarda Overlie, MD [facility]   IR GENERIC HISTORICAL  02/07/2016   IR PATIENT EVAL TECH 0-60 MINS Darrell  K Allred, PA-C [facility]   IR GENERIC HISTORICAL  02/08/2016   IR GJ TUBE CHANGE 02/08/2016 Berdine Dance, MD [facility]   IR GENERIC HISTORICAL  01/06/2016   IR GJ TUBE CHANGE 01/06/2016 [facility] OUT REF   IR GENERIC HISTORICAL  05/02/2016   IR CM INJ ANY COLONIC TUBE W/FLUORO 05/02/2016 Oley Balm, MD [facility]   IR GENERIC HISTORICAL  05/15/2016   IR GJ TUBE CHANGE 05/15/2016 Simonne Come, MD [facility]   IR GENERIC HISTORICAL  06/28/2016   IR GJ TUBE CHANGE 06/28/2016 [facility]   IR GJ TUBE CHANGE  02/20/2017   IR GJ TUBE CHANGE  05/10/2017   IR GJ TUBE CHANGE  07/06/2017   IR GJ TUBE CHANGE  08/02/2017   IR GJ TUBE CHANGE  10/15/2017   IR GJ TUBE CHANGE  12/26/2017   IR GJ TUBE CHANGE  04/08/2018   IR GJ TUBE CHANGE  06/19/2018   IR GJ TUBE CHANGE  03/03/2019   IR GJ TUBE CHANGE  06/06/2019   IR GJ TUBE CHANGE  09/26/2019   IR GJ TUBE CHANGE  01/09/2020   IR GJ TUBE CHANGE  05/12/2020   IR GJ TUBE CHANGE  10/05/2020   IR GJ TUBE CHANGE  10/14/2020   IR GJ TUBE CHANGE  10/19/2020   IR PATIENT EVAL TECH 0-60 MINS  10/19/2016   IR PATIENT EVAL TECH 0-60 MINS  12/25/2016   IR PATIENT EVAL TECH 0-60 MINS  01/29/2017   IR PATIENT EVAL TECH 0-60 MINS  04/04/2017   IR PATIENT EVAL TECH 0-60 MINS  04/30/2017   IR PATIENT EVAL TECH 0-60 MINS  07/31/2017   IR REPLC DUODEN/JEJUNO TUBE PERCUT W/FLUORO  11/14/2016   LAPAROTOMY N/A 01/20/2015   Procedure: EXPLORATORY LAPAROTOMY;  Surgeon: Abigail Miyamoto, MD;  [facility];  Service: General;  Laterality: N/A;   LOWER EXTREMITY ANGIOGRAPHY Left 09/21/2016   Procedure: Lower Extremity Angiography;  Surgeon: Maeola Harman, MD;  Location: [facility] INVASIVE CV LAB;  Service: Cardiovascular;  Laterality: Left;   LOWER EXTREMITY ANGIOGRAPHY Right 06/27/2017   Procedure: Lower Extremity Angiography;  Surgeon: Maeola Harman, MD;  Location: [facility] INVASIVE CV LAB;  Service: Cardiovascular;  Laterality: Right;    LYSIS OF ADHESION N/A 01/20/2015   Procedure: LYSIS OF ADHESIONS < 1 HOUR;  Surgeon: Abigail Miyamoto, MD;  [facility];  Service: General;  Laterality: N/A;   PERIPHERAL VASCULAR BALLOON ANGIOPLASTY Left 09/21/2016   Procedure: Peripheral Vascular Balloon Angioplasty;  Surgeon: Maeola Harman, MD;  Location: [facility] INVASIVE CV LAB;  Service: Cardiovascular;  Laterality: Left;  drug coated balloon  PERIPHERAL VASCULAR BALLOON ANGIOPLASTY Right 06/27/2017   Procedure: PERIPHERAL VASCULAR BALLOON ANGIOPLASTY;  Surgeon: Maeola Harman, MD;  Location: [facility] INVASIVE CV LAB;  Service: Cardiovascular;  Laterality: Right;  SFA/TPTRUNK   PERIPHERAL VASCULAR INTERVENTION Left 10/20/2020   Procedure: PERIPHERAL VASCULAR INTERVENTION;  Surgeon: Leonie Douglas, MD;  [facility] INVAS[facility] CV LAB;  Service: Cardiovascular;  Laterality: Left;   TONSILLECTOMY     TRANSMETATARSAL AMPUTATION Right 11/14/2019   Procedure: TRANSMETATARSAL AMPUTATION RIGHT FOOT;  Surgeon: Felecia Shelling, DPM;  Location: [facility] OR;  Service: Podiatry;  Laterality: Right;   TUBAL LIGATION     VENTRAL HERNIA REPAIR  2015   incarcerated ventral hernia ([facility] 09/2013)/notes 01/10/2015   WOUND EXPLORATION Right 10/20/2020   Procedure: PRIMARY CLOSURE COMMON FEMORAL ARTERY;  Surgeon: Leonie Douglas, MD;  Location: [facility] OR;  Service: Vascular;  Laterality: Right;    Family History  Problem Relation Age of Onset   Stroke Mother    Hypertension Mother    Diabetes Brother    Glaucoma Son    Glaucoma Son    Heart attack Neg Hx    Colon cancer Neg Hx    Stomach cancer Neg Hx     Social History   Socioeconomic History   Marital status: Widowed    Spouse name: Not on file   Number of children: 3   Years of education: Not on file   Highest education level: Not on file  Occupational History   Not on file  Tobacco Use   Smoking status: Never   Smokeless tobacco: Former    Types: Snuff    Quit date: 04/03/1980    Tobacco comments:    "used snuff in my younger days"  Vaping Use   Vaping Use: Never used  Substance and Sexual Activity   Alcohol use: Never    Alcohol/week: 0.0 standard drinks   Drug use: Never   Sexual activity: Not Currently  Other Topics Concern   Not on file  Social History Narrative   Married in 1947, lives in one story house with 2 people and no petsOccupation: ?Has a living Will, [place], and doesn't have DNRWas living with Husband (in frail health) in [place] until 09/2013.  After her [facility] admission they both came to live with daughter in [place]   Widowed   Social Determinants of Health   Financial Resource Strain: Not on file  Food Insecurity: Not on file  Transportation Needs: Not on file  Physical Activity: Not on file  Stress: Not on file  Social Connections: Not on file  Intimate Partner Violence: Not on file    Allergies  Allergen Reactions   Penicillins Itching, Rash and Other (See Comments)    Tolerated amoxicillin, unasyn, zosyn & cephalosporins in the past. Did it involve swelling of the face/tongue/throat, SOB, or low BP? No Did it involve sudden or severe rash/hives, skin peeling, or any reaction on the inside of your mouth or nose? No Did you need to seek medical attention at a hospital or doctor's office? Unknown When did it last happen?      5+ years If all above answers are "NO", may proceed with cephalosporin use.     Current Facility-Administered Medications  Medication Dose Route Frequency Provider Last Rate Last Admin   0.9 %  sodium chloride infusion   Intravenous Continuous Hillary Bow, DO 100 mL/hr at 12/23/20 0616 New Bag at 12/23/20 0616   acetaminophen (TYLENOL) tablet 1,000 mg  1,000 mg  Oral Q8H Zigmund Daniel., MD   1,000 mg at 12/23/20 1315   Followed by   Melene Muller ON 12/25/2020] acetaminophen (TYLENOL) tablet 650 mg  650 mg Oral Q6H PRN Zigmund Daniel., MD       albuterol (PROVENTIL) (2.5 MG/3ML) 0.083% nebulizer  solution 2.5 mg  2.5 mg Inhalation Q6H PRN Hillary Bow, DO       amLODipine (NORVASC) tablet 5 mg  5 mg Oral Daily Julian Reil, Jared M, DO   5 mg at 12/23/20 1316   artificial tears (LACRILUBE) ophthalmic ointment 1 application  1 application Both Eyes QHS Lyda Perone M, DO       artificial tears (LACRILUBE) ophthalmic ointment 1 application  1 application Both Eyes BID PRN Hillary Bow, DO       aspirin EC tablet 81 mg  81 mg Oral Daily Lyda Perone M, DO   81 mg at 12/23/20 1316   atorvastatin (LIPITOR) tablet 10 mg  10 mg Oral Daily Lyda Perone M, DO   10 mg at 12/23/20 1318   clopidogrel (PLAVIX) tablet 75 mg  75 mg Oral Daily Lyda Perone M, DO   75 mg at 12/23/20 1316   cycloSPORINE (RESTASIS) 0.05 % ophthalmic emulsion 1 drop  1 drop Both Eyes BID Hillary Bow, DO   1 drop at 12/23/20 0948   DULoxetine (CYMBALTA) DR capsule 30 mg  30 mg Oral Daily Lyda Perone M, DO   30 mg at 12/23/20 1318   feeding supplement (OSMOLITE 1.5 CAL) liquid 780 mL  780 mL Per Tube Continuous Zigmund Daniel., MD       ferrous sulfate 220 (44 Fe) MG/5ML solution 325 mg  325 mg Oral Daily Lyda Perone M, DO   325 mg at 12/23/20 1314   fluticasone (FLONASE) 50 MCG/ACT nasal spray 2 spray  2 spray Each Nare Daily Hillary Bow, DO   2 spray at 12/23/20 0945   free water 180 mL  180 mL Per Tube TID Hillary Bow, DO   180 mL at 12/22/20 2348   gabapentin (NEURONTIN) capsule 300 mg  300 mg Oral BID Hillary Bow, DO   300 mg at 12/23/20 1318   heparin injection 5,000 Units  5,000 Units Subcutaneous Q8H Hillary Bow, DO   5,000 Units at 12/23/20 1603   hydrALAZINE (APRESOLINE) tablet 50 mg  50 mg Oral TID Hillary Bow, DO   50 mg at 12/23/20 1603   ipratropium-albuterol (DUONEB) 0.5-2.5 (3) MG/3ML nebulizer solution 3 mL  3 mL Nebulization Q6H PRN Hillary Bow, DO       isosorbide mononitrate (IMDUR) 24 hr tablet 60 mg  60 mg Oral Daily Lyda Perone M, DO   60 mg at  12/23/20 1317   latanoprost (XALATAN) 0.005 % ophthalmic solution 1 drop  1 drop Both Eyes QHS Lyda Perone M, DO       levothyroxine (SYNTHROID) tablet 125 mcg  125 mcg Oral QAC breakfast Hillary Bow, DO   125 mcg at 12/23/20 5956   loratadine (CLARITIN) tablet 10 mg  10 mg Oral Daily Lyda Perone M, DO   10 mg at 12/23/20 1318   mometasone-formoterol (DULERA) 200-5 MCG/ACT inhaler 2 puff  2 puff Inhalation BID Hillary Bow, DO   2 puff at 12/23/20 3875   multivitamin with minerals tablet 1 tablet  1 tablet Oral Daily Hillary Bow, DO   1 tablet at 12/23/20  1317   nystatin cream (MYCOSTATIN) 1 application  1 application Topical BID Hillary Bow, DO       ondansetron [facility]) tablet 4 mg  4 mg Oral Q6H PRN Hillary Bow, DO       Or   ondansetron [facility]) injection 4 mg  4 mg Intravenous Q6H PRN Hillary Bow, DO       oxyCODONE (Oxy IR/ROXICODONE) immediate release tablet 2.5-5 mg  2.5-5 mg Oral Q8H PRN Hillary Bow, DO   5 mg at 12/22/20 1056   pantoprazole (PROTONIX) EC tablet 40 mg  40 mg Oral BID Hillary Bow, DO   40 mg at 12/23/20 1317   povidone-iodine (BETADINE) 10 % ointment   Topical Q1400 Hillary Bow, DO   Given at 12/23/20 1549   senna-docusate (Senokot-S) tablet 1 tablet  1 tablet Oral QHS Hillary Bow, DO   1 tablet at 12/22/20 2340    REVIEW OF SYSTEMS:  [X]  denotes positive finding, [ ]  denotes negative finding Cardiac  Comments:  Chest pain or chest pressure:    Shortness of breath upon exertion:    Short of breath when lying flat:    Irregular heart rhythm:        Vascular    Pain in calf, thigh, or hip brought on by ambulation:    Pain in feet at night that wakes you up from your sleep:     Blood clot in your veins:    Leg swelling:         Pulmonary    Oxygen at home:    Productive cough:     Wheezing:         Neurologic    Sudden weakness in arms or legs:     Sudden numbness in arms or legs:     Sudden onset of  difficulty speaking or slurred speech:    Temporary loss of vision in one eye:     Problems with dizziness:         Gastrointestinal    Blood in stool:     Vomited blood:         Genitourinary    Burning when urinating:     Blood in urine:        Psychiatric    Major depression:         Hematologic    Bleeding problems:    Problems with blood clotting too easily:        Skin    Rashes or ulcers:        Constitutional    Fever or chills:      PHYSICAL EXAM Vitals:   12/23/20 0836 12/23/20 1000 12/23/20 1211 12/23/20 1436  BP:   (!) 169/65   Pulse: (!) 16  67   Resp:  17 17   Temp:   98.4 F (36.9 C)   TempSrc:   Oral   SpO2:   100%   Weight:    55.6 kg    Constitutional: Elderly. No distress. Appears poorly nourished.  Neurologic: CN intact. no focal findings. no sensory loss. Psychiatric:  Mood and affect symmetric and appropriate. Eyes:  No icterus. No conjunctival pallor. Ears, nose, throat:  mucous membranes moist. Midline trachea.  Cardiac: regular rate and rhythm.  Respiratory:  unlabored. Abdominal:  soft, non-tender, non-distended.  Peripheral vascular:   2+ R popliteal pulse  2+ L popliteal pulse  Absent pedal pulses bilaterally  Right groin is healed. Extremity: No  edema. No cyanosis. No pallor.  Skin:         Lymphatic: no Stemmer's sign. no palpable lymphadenopathy.  PERTINENT LABORATORY AND RADIOLOGIC DATA  Most recent CBC CBC Latest Ref Rng & Units 12/23/2020 12/22/2020 12/21/2020  WBC 4.0 - 10.5 K/uL 4.1 4.4 4.2  Hemoglobin 12.0 - 15.0 g/dL 7.8(G) 9.5(A) 2.1(H)  Hematocrit 36.0 - 46.0 % 26.2(L) 29.3(L) 28.0(L)  Platelets 150 - 400 K/uL 189 196 194     Most recent CMP CMP Latest Ref Rng & Units 12/23/2020 12/22/2020 12/21/2020  Glucose 70 - 99 mg/dL 086(V) 70 784(O)  BUN 8 - 23 mg/dL 96(E) 95(M) 84(X)  Creatinine 0.44 - 1.00 mg/dL 3.24(M) 0.10(U) 7.25(D)  Sodium 135 - 145 mmol/L 137 138 136  Potassium 3.5 - 5.1 mmol/L 4.1 5.1 4.5   Chloride 98 - 111 mmol/L 108 106 102  CO2 22 - 32 mmol/L 22 21(L) 24  Calcium 8.9 - 10.3 mg/dL 9.0 66.4 9.4  Total Protein 6.5 - 8.1 g/dL 6.4(L) - 6.9  Total Bilirubin 0.3 - 1.2 mg/dL 0.6 - 0.4  Alkaline Phos 38 - 126 U/L 79 - 76  AST 15 - 41 U/L 19 - 20  ALT 0 - 44 U/L 12 - 14    Renal function Estimated Creatinine Clearance: 22.5 mL/min (A) (by C-G formula based on SCr of 1.31 mg/dL (H)).  Hgb A1c MFr Bld (% of total Hgb)  Date Value  11/04/2020 5.5    LDL Cholesterol (Calc)  Date Value Ref Range Status  11/04/2020 39 mg/dL (calc) Final    Comment:    Reference range: <100 . Desirable range <100 mg/dL for primary prevention;   <70 mg/dL for patients with CHD or diabetic patients  with > or = 2 CHD risk factors. [name] LDL-C is now calculated using the Martin-Hopkins  calculation, which is a validated novel method providing  better accuracy than the Friedewald equation in the  estimation of LDL-C.  Horald Pollen et al. Lenox Ahr. 4034;742(59): 2061-2068  (http://education.[facility].com/faq/FAQ164)     ABI 09/10/20   Arterial duplex 10/01/20 LOWER EXTREMITY ARTERIAL DUPLEX STUDY   Patient Name:  NANDANA BIRCH  Date of Exam:   10/01/2020  Medical Rec #: [id number]      Accession #:    [id number]  Date of Birth: [birth year]      Patient Gender: F  Patient Age:   89Y  Exam Location:  [facility]  Procedure:      VAS [place] LOWER EXTREMITY ARTERIAL DUPLEX  Referring Phys: [id number] Rande Brunt Dontray Haberland    ---------------------------------------------------------------------------  -----     Indications: Pre op left transmetatarsal amputation.   High Risk Factors: Hypertension.     Current ABI: 09/10/20 Left: Noncompressible with monophasic waveforms   Comparison Study: none   Performing Technologist: Jeb Levering RDMS, RVT      Examination Guidelines: A complete evaluation includes B-mode imaging,  spectral  Doppler, color Doppler, and power Doppler as needed  of all accessible  portions  of each vessel. Bilateral testing is considered an integral part of a  complete  examination. Limited examinations for reoccurring indications may be  performed  as noted.            +-----------+--------+-----+--------+----------+--------+  LEFT       PSV cm/sRatioStenosisWaveform  Comments  +-----------+--------+-----+--------+----------+--------+  CFA Distal 151                  biphasic            +-----------+--------+-----+--------+----------+--------+  DFA        98                   biphasic            +-----------+--------+-----+--------+----------+--------+  SFA Prox   101                  biphasic            +-----------+--------+-----+--------+----------+--------+  SFA Mid    94                   biphasic            +-----------+--------+-----+--------+----------+--------+  SFA Distal 125                  biphasic            +-----------+--------+-----+--------+----------+--------+  POP Prox   111                  biphasic            +-----------+--------+-----+--------+----------+--------+  POP Distal 87                   monophasic          +-----------+--------+-----+--------+----------+--------+  ATA Mid                 occluded                    +-----------+--------+-----+--------+----------+--------+  ATA Distal              occluded                    +-----------+--------+-----+--------+----------+--------+  PTA Distal 84                   monophasic          +-----------+--------+-----+--------+----------+--------+  PERO Distal55                   monophasic          +-----------+--------+-----+--------+----------+--------+   Rande Brunt. Lenell Antu, MD [facility] Phone Number: [phone] 12/23/2020 7:33 PM

## 2020-12-24 ENCOUNTER — Encounter (HOSPITAL_COMMUNITY): Payer: Medicare Other

## 2020-12-24 ENCOUNTER — Encounter (HOSPITAL_COMMUNITY)
Admission: EM | Disposition: A | Discharge status: Home Health | Payer: Self-pay | Source: Home / Self Care | Attending: Family Medicine

## 2020-12-24 DIAGNOSIS — M86171 Other acute osteomyelitis, right ankle and foot: Secondary | ICD-10-CM

## 2020-12-24 DIAGNOSIS — M86172 Other acute osteomyelitis, left ankle and foot: Secondary | ICD-10-CM

## 2020-12-24 DIAGNOSIS — L97519 Non-pressure chronic ulcer of other part of right foot with unspecified severity: Secondary | ICD-10-CM

## 2020-12-24 DIAGNOSIS — I70235 Atherosclerosis of native arteries of right leg with ulceration of other part of foot: Secondary | ICD-10-CM

## 2020-12-24 HISTORY — PX: LOWER EXTREMITY ANGIOGRAPHY: CATH118251

## 2020-12-24 LAB — COMPREHENSIVE METABOLIC PANEL
ALT: 12 U/L (ref 0–44)
AST: 20 U/L (ref 15–41)
Albumin: 2.5 g/dL — ABNORMAL LOW (ref 3.5–5.0)
Alkaline Phosphatase: 77 U/L (ref 38–126)
Anion gap: 5 (ref 5–15)
BUN: 31 mg/dL — ABNORMAL HIGH (ref 8–23)
CO2: 23 mmol/L (ref 22–32)
Calcium: 8.8 mg/dL — ABNORMAL LOW (ref 8.9–10.3)
Chloride: 108 mmol/L (ref 98–111)
Creatinine, Ser: 1.05 mg/dL — ABNORMAL HIGH (ref 0.44–1.00)
GFR, Estimated: 49 mL/min — ABNORMAL LOW (ref >60)
Glucose, Bld: 168 mg/dL — ABNORMAL HIGH (ref 70–99)
Potassium: 4.1 mmol/L (ref 3.5–5.1)
Sodium: 136 mmol/L (ref 135–145)
Total Bilirubin: 0.6 mg/dL (ref 0.3–1.2)
Total Protein: 6.2 g/dL — ABNORMAL LOW (ref 6.5–8.1)

## 2020-12-24 LAB — CBC WITH DIFFERENTIAL/PLATELET
Abs Immature Granulocytes: 0.01 10*3/uL (ref 0.00–0.07)
Basophils Absolute: 0.1 10*3/uL (ref 0.0–0.1)
Basophils Relative: 2 %
Eosinophils Absolute: 0.3 10*3/uL (ref 0.0–0.5)
Eosinophils Relative: 9 %
HCT: 26.9 % — ABNORMAL LOW (ref 36.0–46.0)
Hemoglobin: 9.1 g/dL — ABNORMAL LOW (ref 12.0–15.0)
Immature Granulocytes: 0 %
Lymphocytes Relative: 20 %
Lymphs Abs: 0.7 10*3/uL (ref 0.7–4.0)
MCH: 32.7 pg (ref 26.0–34.0)
MCHC: 33.8 g/dL (ref 30.0–36.0)
MCV: 96.8 fL (ref 80.0–100.0)
Monocytes Absolute: 0.6 10*3/uL (ref 0.1–1.0)
Monocytes Relative: 18 %
Neutro Abs: 1.7 10*3/uL (ref 1.7–7.7)
Neutrophils Relative %: 51 %
Platelets: 189 10*3/uL (ref 150–400)
RBC: 2.78 MIL/uL — ABNORMAL LOW (ref 3.87–5.11)
RDW: 17.2 % — ABNORMAL HIGH (ref 11.5–15.5)
WBC: 3.4 10*3/uL — ABNORMAL LOW (ref 4.0–10.5)
nRBC: 0 % (ref 0.0–0.2)

## 2020-12-24 LAB — GLUCOSE, CAPILLARY
Glucose-Capillary: 149 mg/dL — ABNORMAL HIGH (ref 70–99)
Glucose-Capillary: 140 mg/dL — ABNORMAL HIGH (ref 70–99)
Glucose-Capillary: 109 mg/dL — ABNORMAL HIGH (ref 70–99)
Glucose-Capillary: 88 mg/dL (ref 70–99)
Glucose-Capillary: 110 mg/dL — ABNORMAL HIGH (ref 70–99)

## 2020-12-24 LAB — MAGNESIUM: Magnesium: 1.8 mg/dL (ref 1.7–2.4)

## 2020-12-24 LAB — PHOSPHORUS: Phosphorus: 3.1 mg/dL (ref 2.5–4.6)

## 2020-12-24 SURGERY — LOWER EXTREMITY ANGIOGRAPHY
Anesthesia: LOCAL | Laterality: Right

## 2020-12-24 SURGERY — IRRIGATION AND DEBRIDEMENT FOOT
Anesthesia: Choice | Laterality: Bilateral

## 2020-12-24 MED ORDER — HEPARIN (PORCINE) IN NACL 1000-0.9 UT/500ML-% IV SOLN
INTRAVENOUS | Status: AC
  Filled 2020-12-24: qty 1000

## 2020-12-24 MED ORDER — IODIXANOL 320 MG/ML IV SOLN
INTRAVENOUS | Status: DC | PRN
  Administered 2020-12-24: 35 mL

## 2020-12-24 MED ORDER — SODIUM CHLORIDE 0.9 % WEIGHT BASED INFUSION: 1.0000 mL/kg/h | INTRAVENOUS | Status: AC

## 2020-12-24 MED ORDER — SODIUM CHLORIDE 0.9% FLUSH
3.0000 mL | Freq: Two times a day (BID) | INTRAVENOUS | Status: DC
  Administered 2020-12-25 – 2020-12-27 (×4): 3 mL via INTRAVENOUS
  Administered 2020-12-28: 20 mL via INTRAVENOUS
  Administered 2020-12-29 – 2020-12-30 (×4): 3 mL via INTRAVENOUS

## 2020-12-24 MED ORDER — LABETALOL HCL 5 MG/ML IV SOLN: 10.0000 mg | INTRAVENOUS | Status: DC | PRN

## 2020-12-24 MED ORDER — LIDOCAINE HCL (PF) 1 % IJ SOLN
INTRAMUSCULAR | Status: DC | PRN
  Administered 2020-12-24: 15 mL

## 2020-12-24 MED ORDER — HEPARIN (PORCINE) IN NACL 1000-0.9 UT/500ML-% IV SOLN
INTRAVENOUS | Status: DC | PRN
  Administered 2020-12-24 (×2): 500 mL

## 2020-12-24 MED ORDER — SODIUM CHLORIDE 0.9 % IV SOLN
INTRAVENOUS | Status: AC | PRN
  Administered 2020-12-24: 10 mL/h via INTRAVENOUS

## 2020-12-24 MED ORDER — HYDRALAZINE HCL 20 MG/ML IJ SOLN: 5.0000 mg | INTRAMUSCULAR | Status: DC | PRN

## 2020-12-24 MED ORDER — SODIUM CHLORIDE 0.9 % IV SOLN
250.0000 mL | INTRAVENOUS | Status: DC | PRN
  Administered 2020-12-26 – 2020-12-28 (×3): 250 mL via INTRAVENOUS

## 2020-12-24 MED ORDER — SODIUM CHLORIDE 0.9% FLUSH: 3.0000 mL | INTRAVENOUS | Status: DC | PRN

## 2020-12-24 MED ORDER — LIDOCAINE HCL (PF) 1 % IJ SOLN
INTRAMUSCULAR | Status: AC
  Filled 2020-12-24: qty 30

## 2020-12-24 SURGICAL SUPPLY — 10 items
CATH CROSS OVER TEMPO 5F (CATHETERS) ×2 IMPLANT
GUIDEWIRE ANGLED .035X150CM (WIRE) ×2 IMPLANT
KIT MICROPUNCTURE NIT STIFF (SHEATH) ×2 IMPLANT
KIT PV (KITS) ×2 IMPLANT
SHEATH PINNACLE 5F 10CM (SHEATH) ×2 IMPLANT
SHEATH PROBE COVER 6X72 (BAG) ×2 IMPLANT
SYR MEDRAD MARK V 150ML (SYRINGE) ×2 IMPLANT
TRANSDUCER W/STOPCOCK (MISCELLANEOUS) ×2 IMPLANT
TRAY PV CATH (CUSTOM PROCEDURE TRAY) ×2 IMPLANT
WIRE STARTER BENTSON 035X150 (WIRE) ×2 IMPLANT

## 2020-12-24 NOTE — Progress Notes (Signed)
Site area: left groin fa sheath Site Prior to Removal:  Level 0 Pressure Applied For: 20 minutes Manual:   yes Patient Status During Pull:  stable Post Pull Site:  Level 0 Post Pull Instructions Given:  yes Post Pull Pulses Present: left dp dopplered Dressing Applied:  gauze and tegaderm Bedrest begins @ 1800 Comments:

## 2020-12-24 NOTE — Op Note (Signed)
DATE OF SERVICE: 12/24/2020  PATIENT:  Shannon [name]  87 y.o. 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 Stanford Breed, MD  ASSISTANT: none  ANESTHESIA:   local  ESTIMATED BLOOD LOSS: min  LOCAL MEDICATIONS USED:  LIDOCAINE   COUNTS: confirmed correct.  PATIENT DISPOSITION:  PACU - hemodynamically stable.   Delay start of Pharmacological VTE agent (>24hrs) due to surgical blood loss or risk of bleeding: no  INDICATION FOR PROCEDURE: Shannon [name] is a 87 y.o. female with bilateral lower extremity ulceration and osteomyelitis.  She underwent angiography with me in July which treated a left popliteal lesion.  She had much improved flow to her foot.  She has seen some healing of a left foot ulcer, but has underlying osteomyelitis still.  Recent MRI shows that she also has right-sided osteomyelitis with ulceration.  As I did not evaluate her right lower extremity during angiography in July, I offered her repeat angiography.  The patient was amenable and wished to proceed.  OPERATIVE FINDINGS:   Right lower extremity: Common femoral artery: widely patent without flow limiting stenosis  Profunda femoris artery:  widely patent without flow limiting stenosis  Superficial femoral artery: Diffusely diseased, approximately 50% calcified stenosis Popliteal artery: diffusely diseased Anterior tibial artery: occluded Tibioperoneal trunk: widely patent without flow limiting stenosis Peroneal artery: widely patent without flow limiting stenosis  Posterior tibial artery: occluded Pedal circulation: fills via robust collateralization from peroneal  DESCRIPTION OF PROCEDURE: After identification of the patient in  the pre-operative holding area, the patient was transferred to the operating room. The patient was positioned supine on the operating room table. Anesthesia was induced. The groins was prepped and draped in standard fashion. A surgical pause was performed confirming correct patient, procedure, and operative location.  The left groin was anesthetized with subcutaneous injection of 1% lidocaine. Using ultrasound guidance, the left common femoral artery was accessed with micropuncture technique. Fluoroscopy was used to confirm cannulation over the femoral head. The 51F sheath was upsized to 43F.   A Benson wire was advanced into the distal aorta. Over the wire an omni flush catheter was advanced to the level of L2. Aortogram was performed - see above for details.   The right common iliac artery was selected with a Bentson guidewire. The wire was advanced into the common femoral artery. Over the wire the omni flush catheter was advanced into the external iliac artery. Selective angiography was performed - see above for details.   The sheath was left in place with plans to remove the sheath in PACU.  Upon completion of the case instrument and sharps counts were confirmed correct. The patient was transferred to the PACU in good condition. I was present for all portions of the procedure.  PLAN: Patient is optimized in both lower extremities from vascular surgery standpoint.  Continue aspirin, Plavix, statin therapy indefinitely.  Safe to proceed with foot debridement as necessary per podiatry team.  [name]. Stanford Breed, MD [facility] Phone Number: [phone] 12/24/2020 4:36 PM

## 2020-12-24 NOTE — Progress Notes (Signed)
Subjective: 87 year old female with past medical history significant for type 2 diabetes, hypertension, CKD stage IV, gastric stenosis status post feeding tube presented to the hospital after episode of unresponsiveness.  She has had a history of a right transmetatarsal potation she has had chronic ulcerations.  She has been evaluated by vascular surgery as well.  MRIs did reveal osteomyelitis of bilateral feet.  The patient is not open to amputation at this time.  Vascular surgery has been consulted to evaluate the circulation and planning for angio later today.  She has no new concerns today.  Objective: AAO x3, NAD-sitting up in bed. Pulses decreased.   Overall exam appears to be unchanged.  Ulcerations present some of metatarsal stumps of the right third and fifth. Small amount of purulence identified from the wound on the third metatarsal remnant on the right foot.  Wounds are full-thickness.  On the left foot there is a granular wound present along the medial first MPJ which appears to be somewhat improved in size.  Small mount of bleeding today upon removal of the bandage.  No purulence.  There is no surrounding erythema, ascending cellulitis.  No drainage or pus.   No pain with calf compression, swelling, warmth, erythema  Assessment: Ulcerations bilateral with osteomyelitis; PAD  Plan: Dressing was changed today.  Plan for angio later today with vascular surgery.  We will await the results of this before further intervention from our standpoint.  Continue local wound care.  Patient's daughter, [name], was updated via phone.  Celesta Gentile, DPM

## 2020-12-24 NOTE — Care Management Important Message (Signed)
Important Message  Patient Details  Name: Shannon [name] MRN: [id number] Date of Birth: 1924/03/29   Medicare Important Message Given:  Yes     Orbie Pyo 12/24/2020, 4:02 PM

## 2020-12-24 NOTE — Progress Notes (Signed)
PROGRESS NOTE    Shannon [name]  ZOX:[id number] DOB: Dec 19, 1923 DOA: 12/21/2020 PCP: Sharon Seller, NP   No chief complaint on file.   Brief Narrative:  Shannon [name] is Shannon [name] 87 y.o. female with medical history significant of DM2, HTN, CKD 4, gastric stenosis s/p feeding tube.  She presented to the hospital after an episode of unresponsiveness.  She was brought to hospital by EMS.  She was complaining of bilateral foot pain.    She's being seen for bilateral lower extremity osteo.   Assessment & Plan:   Principal Problem:   Osteomyelitis of left foot (HCC) Active Problems:   Type 2 diabetes mellitus with chronic kidney disease (HCC)   Accelerated hypertension   Acute renal failure superimposed on stage 4 chronic kidney disease (HCC)   Chronic diastolic CHF (congestive heart failure) (HCC)   Osteomyelitis of right foot (HCC)   Diabetic infection of right foot (HCC)   Diabetic foot infection (HCC)  Bilateral Diabetic Foot Infections  Osteomyelitis MRI R foot notable for acute osteo of 3rd metatarsal, bone marrow edema at resection margin of 5th metatarsal, diffuse soft tissue swelling at the distal stump as well as the dorsum of the forefoot.   MRI L foot notable for dislocation and hallux valgus at the first MTP joint with boy erosion particularaly in the head of the first metatarsal medially, but also in the base of the proximal phalanx, with marrow edema in both of the structures - concerning for osteo and possible septic joint.  Transverse fx of head of 3rd metatarsa, ? Superimposed osteo.   R foot ulcer with enterococcus 8 days ago L foot ulcer with pseudomonas 3 months ago Infectious disease consult, appreciate recs - recommending holding abx for now, recommending vascular eval regarding risk of debridement/healing.  Vascular c/s - s/p RLE angiogram 9/23 - per vascular, pt optimized in both lower extremities from vascular surgery standpoint.  ASA, plavix, statin indefinitely.   Podiatry c/s, appreciate recs - considering debridement/biopsy  PAD S/p L popliteal angioplasty 10/20/20 At that point, recommending aspirin indefinitely, plavix indefinitely.  Statin indefinitely.  Vascular c/s, appreciate assistance with PAD and above  AKI on CKD IIIB Baseline creatinine appears to be around 1.45 1.68 at presentation improving  HTN Continue home BP meds  T2DM A1c 5.5 last month Diet controlled Continue BG check  HFpEF Holding lasix given aki on presentation  Gastric stenosis s/p feeding tube Continue tube feeds   DVT prophylaxis: heparin Code Status: DNR Family Communication: none at bedside - daughter over phone 9/21, 9/22 Disposition:   Status is: Inpatient  Remains inpatient appropriate because:Inpatient level of care appropriate due to severity of illness  Dispo: The patient is from: Home              Anticipated d/c is to:  pending              Patient currently is not medically stable to d/c.   Difficult to place patient No       Consultants:  ID podiatry  Procedures:  PROCEDURE:   1) US guided left common femoral artery access 2) Right lower extremity angiogram with second order cannulation (35mL total contrast)  Antimicrobials:  Anti-infectives (From admission, onward)    Start     Dose/Rate Route Frequency Ordered Stop   12/21/20 2200  piperacillin-tazobactam (ZOSYN) IVPB 3.375 g  Status:  Discontinued        3.375 g 12.5 mL/hr over 240 Minutes Intravenous Every  12 hours 12/21/20 2105 12/22/20 1157   12/21/20 2030  vancomycin (VANCOREADY) IVPB 1250 mg/250 mL  Status:  Discontinued        1,250 mg 166.7 mL/hr over 90 Minutes Intravenous  Once 12/21/20 2022 12/21/20 2035          Subjective: No complaints  Objective: Vitals:   12/24/20 1755 12/24/20 1800 12/24/20 1805 12/24/20 1851  BP: (!) 154/50 (!) 150/54 (!) 155/54 (!) 161/70  Pulse: 68 69 73 71  Resp: 14 16 16 18   Temp:    97.7 F (36.5 C)  TempSrc:     Oral  SpO2: 100% 100% 100% 100%  Weight:        Intake/Output Summary (Last 24 hours) at 12/24/2020 1920 Last data filed at 12/24/2020 1210 Gross per 24 hour  Intake 1396.52 ml  Output 1550 ml  Net -153.48 ml   Filed Weights   12/23/20 1436 12/24/20 0500  Weight: 55.6 kg 60.4 kg    Examination:  General: No acute distress. Cardiovascular:RRR Lungs: unlabored Abdomen: Soft, nontender, nondistended Neurological: Alert and oriented 3. Moves all extremities 4. Cranial nerves II through XII grossly intact. Skin: Warm and dry. No rashes or lesions. Extremities: drssings intact to LE    Data Reviewed: I have personally reviewed following labs and imaging studies  CBC: Recent Labs  Lab 12/20/20 1443 12/21/20 1030 12/22/20 0340 12/23/20 0111 12/24/20 0424  WBC 6.0 4.2 4.4 4.1 3.4*  NEUTROABS  --  2.5  --  2.2 1.7  HGB 9.5* 9.2* 9.8* 8.8* 9.1*  HCT 28.9* 28.0* 29.3* 26.2* 26.9*  MCV 97.6 96.9 97.7 95.6 96.8  PLT 232 194 196 189 189    Basic Metabolic Panel: Recent Labs  Lab 12/20/20 1443 12/21/20 1030 12/22/20 0340 12/23/20 0111 12/24/20 0424  NA 137 136 138 137 136  K 5.1 4.5 5.1 4.1 4.1  CL 101 102 106 108 108  CO2 27 24 21* 22 23  GLUCOSE 87 122* 70 175* 168*  BUN 73* 70* 59* 47* 31*  CREATININE 1.68* 1.65* 1.49* 1.31* 1.05*  CALCIUM 9.9 9.4 10.0 9.0 8.8*  MG  --   --   --  1.9 1.8  PHOS  --   --   --  2.7 3.1    GFR: Estimated Creatinine Clearance: 30 mL/min (Shannon [name]) (by C-G formula based on SCr of 1.05 mg/dL (H)).  Liver Function Tests: Recent Labs  Lab 12/21/20 1030 12/23/20 0111 12/24/20 0424  AST 20 19 20   ALT 14 12 12   ALKPHOS 76 79 77  BILITOT 0.4 0.6 0.6  PROT 6.9 6.4* 6.2*  ALBUMIN 3.0* 2.6* 2.5*    CBG: Recent Labs  Lab 12/23/20 2325 12/24/20 0433 12/24/20 1010 12/24/20 1207 12/24/20 1740  GLUCAP 130* 149* 140* 109* 88     Recent Results (from the past 240 hour(s))  Blood culture (routine x 2)     Status: None (Preliminary  result)   Collection Time: 12/21/20 10:29 AM   Specimen: BLOOD  Result Value Ref Range Status   Specimen Description BLOOD RIGHT ANTECUBITAL  Final   Special Requests   Final    BOTTLES DRAWN AEROBIC ONLY Blood Culture results may not be optimal due to an inadequate volume of blood received in culture bottles   Culture   Final    NO GROWTH 2 DAYS Performed at [facility], [address] [place], [place] [address]    Report Status PENDING  Incomplete  Blood culture (routine  x 2)     Status: None (Preliminary result)   Collection Time: 12/21/20 10:34 AM   Specimen: BLOOD  Result Value Ref Range Status   Specimen Description BLOOD SITE NOT SPECIFIED  Final   Special Requests   Final    BOTTLES DRAWN AEROBIC ONLY Blood Culture results may not be optimal due to an inadequate volume of blood received in culture bottles   Culture   Final    NO GROWTH 2 DAYS Performed at [facility], [address], [place], [place] [address]    Report Status PENDING  Incomplete  Resp Panel by RT-PCR (Flu Brinlee Gambrell&B, Covid) Urine, Clean Catch     Status: None   Collection Time: 12/21/20  8:34 PM   Specimen: Urine, Clean Catch; Nasopharyngeal(NP) swabs in vial transport medium  Result Value Ref Range Status   SARS Coronavirus 2 by RT PCR NEGATIVE NEGATIVE Final    Comment: (NOTE) SARS-CoV-2 target nucleic acids are NOT DETECTED.  The SARS-CoV-2 RNA is generally detectable in upper respiratory specimens during the acute phase of infection. The lowest concentration of SARS-CoV-2 viral copies this assay can detect is 138 copies/mL. Rhythm Gubbels negative result does not preclude SARS-Cov-2 infection and should not be used as the sole basis for treatment or other patient management decisions. Chael Urenda negative result may occur with  improper specimen collection/handling, submission of specimen other than nasopharyngeal swab, presence of viral mutation(s) within the areas targeted by this assay, and inadequate number  of viral copies(<138 copies/mL). Freddy Kinne negative result must be combined with clinical observations, patient history, and epidemiological information. The expected result is Negative.  Fact Sheet for Patients:  BloggerCourse.com  Fact Sheet for Healthcare Providers:  SeriousBroker.it  This test is no t yet approved or cleared by the [place] FDA and  has been authorized for detection and/or diagnosis of SARS-CoV-2 by FDA under an Emergency Use Authorization (EUA). This EUA will remain  in effect (meaning this test can be used) for the duration of the COVID-19 declaration under Section 564(b)(1) of the Act, 21 U.S.C.section 360bbb-3(b)(1), unless the authorization is terminated  or revoked sooner.       Influenza Azaylah Stailey by PCR NEGATIVE NEGATIVE Final   Influenza B by PCR NEGATIVE NEGATIVE Final    Comment: (NOTE) The Xpert Xpress SARS-CoV-2/FLU/RSV plus assay is intended as an aid in the diagnosis of influenza from Nasopharyngeal swab specimens and should not be used as Tilman Mcclaren sole basis for treatment. Nasal washings and aspirates are unacceptable for Xpert Xpress SARS-CoV-2/FLU/RSV testing.  Fact Sheet for Patients: BloggerCourse.com  Fact Sheet for Healthcare Providers: SeriousBroker.it  This test is not yet approved or cleared by the [place] FDA and has been authorized for detection and/or diagnosis of SARS-CoV-2 by FDA under an Emergency Use Authorization (EUA). This EUA will remain in effect (meaning this test can be used) for the duration of the COVID-19 declaration under Section 564(b)(1) of the Act, 21 U.S.C. section 360bbb-3(b)(1), unless the authorization is terminated or revoked.  Performed at [facility], [address], [place], [place] [address]   MRSA Next Gen by PCR, Nasal     Status: None   Collection Time: 12/22/20  1:00 AM   Specimen: Nasal Mucosa;  Nasal Swab  Result Value Ref Range Status   MRSA by PCR Next Gen NOT DETECTED NOT DETECTED Final    Comment: (NOTE) The GeneXpert MRSA Assay (FDA approved for NASAL specimens only), is one component of Jocie Meroney comprehensive MRSA  colonization surveillance program. It is not intended to diagnose MRSA infection nor to guide or monitor treatment for MRSA infections. Test performance is not FDA approved in patients less than 72 years old. Performed at [facility], [address], [place], [place] [address]          Radiology Studies: PERIPHERAL VASCULAR CATHETERIZATION  Result Date: 12/24/2020 DATE OF SERVICE: 12/24/2020  PATIENT:  Shannon [name]  87 y.o. 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 Lenell Antu, MD  ASSISTANT: none  ANESTHESIA:   local  ESTIMATED BLOOD LOSS: min  LOCAL MEDICATIONS USED:  LIDOCAINE  COUNTS: confirmed correct.  PATIENT DISPOSITION:  PACU - hemodynamically stable.  Delay start of Pharmacological VTE agent (>24hrs) due to surgical blood loss or risk of bleeding: no  INDICATION FOR PROCEDURE: Shannon [name] is Shannon [name] 87 y.o. female with bilateral lower extremity ulceration and osteomyelitis.  She underwent angiography with me in July which treated Shannon [name] left popliteal lesion.  She had much improved flow to her foot.  She has seen some healing of Shannon [name] left foot ulcer, but has underlying osteomyelitis still.  Recent MRI shows that she also has right-sided osteomyelitis with ulceration.  As I did not evaluate her right lower extremity during angiography in July, I offered her repeat angiography.  The patient was amenable and wished to proceed.  OPERATIVE FINDINGS:  Right lower extremity: Common femoral artery:  widely patent without flow limiting stenosis Profunda femoris artery:  widely patent without flow limiting stenosis Superficial femoral artery: Diffusely diseased, approximately 50% calcified stenosis Popliteal artery: diffusely diseased Anterior tibial artery: occluded Tibioperoneal trunk: widely patent without flow limiting stenosis Peroneal artery: widely patent without flow limiting stenosis Posterior tibial artery: occluded Pedal circulation: fills via robust collateralization from peroneal  DESCRIPTION OF PROCEDURE: After identification of the patient in the pre-operative holding area, the patient was transferred to the operating room. The patient was positioned supine on the operating room table. Anesthesia was induced. The groins was prepped and draped in standard fashion. Shannon [name] surgical pause was performed confirming correct patient, procedure, and operative location.  The left groin was anesthetized with subcutaneous injection of 1% lidocaine. Using ultrasound guidance, the left common femoral artery was accessed with micropuncture technique. Fluoroscopy was used to confirm cannulation over the femoral head. The 46F sheath was upsized to 14F.  Shannon [name] wire was advanced into the distal aorta. Over the wire an omni flush catheter was advanced to the level of L2. Aortogram was performed - see above for details.  The right common iliac artery was selected with Shannon [name] guidewire. The wire was advanced into the common femoral artery. Over the wire the omni flush catheter was advanced into the external iliac artery. Selective angiography was performed - see above for details.  The sheath was left in place with plans to remove the sheath in PACU.  Upon completion of the case instrument and sharps counts were confirmed correct. The patient was transferred to the PACU in good condition. I was present for all portions of the procedure.  PLAN: Patient is optimized in both lower extremities from vascular surgery  standpoint.  Continue aspirin, Plavix, statin therapy indefinitely.  Safe to proceed with foot debridement as necessary per  podiatry team.  Rande Brunt. Lenell Antu, MD [facility] Phone Number: [phone] 12/24/2020 4:36 PM        Scheduled Meds:  acetaminophen  1,000 mg Oral Q8H   amLODipine  5 mg Oral Daily   artificial tears  1 application Both Eyes QHS   aspirin EC  81 mg Oral Daily   atorvastatin  10 mg Oral Daily   clopidogrel  75 mg Oral Daily   cycloSPORINE  1 drop Both Eyes BID   DULoxetine  30 mg Oral Daily   ferrous sulfate  325 mg Oral Daily   fluticasone  2 spray Each Nare Daily   free water  180 mL Per Tube TID   gabapentin  300 mg Oral BID   heparin  5,000 Units Subcutaneous Q8H   hydrALAZINE  50 mg Oral TID   isosorbide mononitrate  60 mg Oral Daily   latanoprost  1 drop Both Eyes QHS   levothyroxine  125 mcg Oral QAC breakfast   loratadine  10 mg Oral Daily   mometasone-formoterol  2 puff Inhalation BID   multivitamin with minerals  1 tablet Oral Daily   nystatin cream  1 application Topical BID   pantoprazole  40 mg Oral BID   povidone-iodine   Topical Q1400   senna-docusate  1 tablet Oral QHS   Continuous Infusions:  sodium chloride 100 mL/hr at 12/24/20 0306   sodium chloride 1 mL/kg/hr (12/24/20 1733)   feeding supplement (OSMOLITE 1.5 CAL) 65 mL/hr at 12/23/20 2115     LOS: 3 days    Time spent: over 30 min    Lacretia Nicks, MD [facility]   To contact the attending provider between 7A-7P or the covering provider during after hours 7P-7A, please log into the web site [email or web address] and access using universal [place] password for that web site. If you do not have the password, please call the hospital operator.  12/24/2020, 7:20 PM

## 2020-12-24 NOTE — Progress Notes (Signed)
Site area: left groin arterial sheath Site Prior to Removal:  Level 0 Pressure Applied For: 20 minutes Manual:   yes Patient Status During Pull:  stable  Post Pull Site:  Level 0 Post Pull Instructions Given:  yes Post Pull Pulses Present: left dp dopplered Dressing Applied:  gauze and tegaderm Bedrest begins @ 1800 Comments:

## 2020-12-24 NOTE — Progress Notes (Signed)
[facility] [place] PROGRESS NOTE  ASSESSMENT / PLAN: Shannon [name] is a 87 y.o. female with atherosclerosis of native arteries of bilateral lower extremities causing ulceration.  She underwent left popliteal angioplasty 10/20/20.  This was complicated by hypotension and hematoma which required common femoral exploration.  Thankfully she has healed her right groin incision.   Recommend the following which can slow the progression of atherosclerosis and reduce the risk of major adverse cardiac / limb events:  Complete cessation from all tobacco products. Blood glucose control with goal A1c < 7%. Blood pressure control with goal blood pressure < 140/90 mmHg. Lipid reduction therapy with goal LDL-C <100 mg/dL (<70 if symptomatic from PAD).  Aspirin 81mg  PO QD.  Atorvastatin 40-80mg  PO QD (or other "high intensity" statin therapy).   She is optimized in the left lower extremity from a vascular standpoint.  Her left foot ulcer appears improved, but there is MRI evidence of osteomyelitis, which will need to be excised.    Plan RLE angiogram today in cath lab today.  SUBJECTIVE: No complaints.  Reviewed plan with patient, daughter, and granddaughter.  They are in agreement for right lower extremity angiogram with possible intervention today.  OBJECTIVE: BP (!) 155/57 (BP Location: Right Arm)   Pulse 72   Temp 97.8 F (36.6 C) (Oral)   Resp 17   Wt 60.4 kg   SpO2 99%   BMI 21.49 kg/m   Intake/Output Summary (Last 24 hours) at 12/24/2020 1312 Last data filed at 12/24/2020 1210 Gross per 24 hour  Intake 2622.16 ml  Output 2150 ml  Net 472.16 ml    No acute distress Regular rate and rhythm Unlabored Unchanged appearance of bilateral feet.  CBC Latest Ref Rng & Units 12/24/2020 12/23/2020 12/22/2020  WBC 4.0 - 10.5 K/uL 3.4(L) 4.1 4.4  Hemoglobin 12.0 - 15.0 g/dL 9.1(L) 8.8(L) 9.8(L)  Hematocrit 36.0 - 46.0 % 26.9(L) 26.2(L) 29.3(L)  Platelets 150 - 400 K/uL 189  189 196     CMP Latest Ref Rng & Units 12/24/2020 12/23/2020 12/22/2020  Glucose 70 - 99 mg/dL 168(H) 175(H) 70  BUN 8 - 23 mg/dL 31(H) 47(H) 59(H)  Creatinine 0.44 - 1.00 mg/dL 1.05(H) 1.31(H) 1.49(H)  Sodium 135 - 145 mmol/L 136 137 138  Potassium 3.5 - 5.1 mmol/L 4.1 4.1 5.1  Chloride 98 - 111 mmol/L 108 108 106  CO2 22 - 32 mmol/L 23 22 21(L)  Calcium 8.9 - 10.3 mg/dL 8.8(L) 9.0 10.0  Total Protein 6.5 - 8.1 g/dL 6.2(L) 6.4(L) -  Total Bilirubin 0.3 - 1.2 mg/dL 0.6 0.6 -  Alkaline Phos 38 - 126 U/L 77 79 -  AST 15 - 41 U/L 20 19 -  ALT 0 - 44 U/L 12 12 -    Estimated Creatinine Clearance: 30 mL/min (A) (by C-G formula based on SCr of 1.05 mg/dL (H)).  [name]. Stanford Breed, MD [facility] Phone Number: [phone] 12/24/2020 1:12 PM

## 2020-12-24 NOTE — Consult Note (Signed)
[facility] Inpatient Consult   12/24/2020  Shannon [name] [birth year] [id number]  [facility] [[name]]  Accountable Care Organization [ACO] Patient: Medicare CMS DCE  Primary Care Provider:  Sharon Seller, NP, [facility]  Patient is currently active with [facility] [[name]] Care Management for chronic disease management services.  Patient has been engaged by a [facility].    Patient currently for procedures.    Plan:  Continue to follow for progress.  Will follow with Inpatient Transition Of Care [TOC] team member and with progress notes for post hospital disposition and needs. Of note, [facility] Care Management services does not replace or interfere with any services that are needed or arranged by inpatient [facility] care management team.  For additional questions or referrals please contact:

## 2020-12-25 DIAGNOSIS — M86171 Other acute osteomyelitis, right ankle and foot: Secondary | ICD-10-CM

## 2020-12-25 DIAGNOSIS — M86172 Other acute osteomyelitis, left ankle and foot: Secondary | ICD-10-CM

## 2020-12-25 LAB — PHOSPHORUS: Phosphorus: 2.6 mg/dL (ref 2.5–4.6)

## 2020-12-25 LAB — COMPREHENSIVE METABOLIC PANEL
ALT: 16 U/L (ref 0–44)
AST: 26 U/L (ref 15–41)
Albumin: 2.6 g/dL — ABNORMAL LOW (ref 3.5–5.0)
Alkaline Phosphatase: 75 U/L (ref 38–126)
Anion gap: 6 (ref 5–15)
BUN: 27 mg/dL — ABNORMAL HIGH (ref 8–23)
CO2: 21 mmol/L — ABNORMAL LOW (ref 22–32)
Calcium: 8.9 mg/dL (ref 8.9–10.3)
Chloride: 106 mmol/L (ref 98–111)
Creatinine, Ser: 1 mg/dL (ref 0.44–1.00)
GFR, Estimated: 52 mL/min — ABNORMAL LOW (ref >60)
Glucose, Bld: 154 mg/dL — ABNORMAL HIGH (ref 70–99)
Potassium: 4.1 mmol/L (ref 3.5–5.1)
Sodium: 133 mmol/L — ABNORMAL LOW (ref 135–145)
Total Bilirubin: 0.6 mg/dL (ref 0.3–1.2)
Total Protein: 6.3 g/dL — ABNORMAL LOW (ref 6.5–8.1)

## 2020-12-25 LAB — CBC WITH DIFFERENTIAL/PLATELET
Abs Immature Granulocytes: 0.01 10*3/uL (ref 0.00–0.07)
Basophils Absolute: 0 10*3/uL (ref 0.0–0.1)
Basophils Relative: 1 %
Eosinophils Absolute: 0.2 10*3/uL (ref 0.0–0.5)
Eosinophils Relative: 5 %
HCT: 26.2 % — ABNORMAL LOW (ref 36.0–46.0)
Hemoglobin: 8.6 g/dL — ABNORMAL LOW (ref 12.0–15.0)
Immature Granulocytes: 0 %
Lymphocytes Relative: 11 %
Lymphs Abs: 0.5 10*3/uL — ABNORMAL LOW (ref 0.7–4.0)
MCH: 31.9 pg (ref 26.0–34.0)
MCHC: 32.8 g/dL (ref 30.0–36.0)
MCV: 97 fL (ref 80.0–100.0)
Monocytes Absolute: 0.7 10*3/uL (ref 0.1–1.0)
Monocytes Relative: 16 %
Neutro Abs: 3 10*3/uL (ref 1.7–7.7)
Neutrophils Relative %: 67 %
Platelets: 190 10*3/uL (ref 150–400)
RBC: 2.7 MIL/uL — ABNORMAL LOW (ref 3.87–5.11)
RDW: 17.2 % — ABNORMAL HIGH (ref 11.5–15.5)
WBC: 4.5 10*3/uL (ref 4.0–10.5)
nRBC: 0 % (ref 0.0–0.2)

## 2020-12-25 LAB — GLUCOSE, CAPILLARY
Glucose-Capillary: 147 mg/dL — ABNORMAL HIGH (ref 70–99)
Glucose-Capillary: 150 mg/dL — ABNORMAL HIGH (ref 70–99)
Glucose-Capillary: 154 mg/dL — ABNORMAL HIGH (ref 70–99)
Glucose-Capillary: 82 mg/dL (ref 70–99)
Glucose-Capillary: 87 mg/dL (ref 70–99)
Glucose-Capillary: 90 mg/dL (ref 70–99)

## 2020-12-25 LAB — MAGNESIUM: Magnesium: 1.8 mg/dL (ref 1.7–2.4)

## 2020-12-25 MED ORDER — CHLORHEXIDINE GLUCONATE CLOTH 2 % EX PADS
6.0000 | MEDICATED_PAD | Freq: Once | CUTANEOUS | Status: AC
  Administered 2020-12-26: 6 via TOPICAL

## 2020-12-25 NOTE — Progress Notes (Signed)
Subjective: 87 year old female with past medical history significant for type 2 diabetes, hypertension, CKD stage IV, gastric stenosis status post feeding tube presented to the hospital after episode of unresponsiveness.  She has had a history of a right transmetatarsal potation she has had chronic ulcerations.  She has been evaluated by vascular surgery as well.  She has been optimized from a vascular surgery standpoint to proceed with intervention on the feet.  She reports occasional discomfort but denies any fevers or chills.  No other concerns.  Objective: AAO x3, NAD-sitting up in bed. Pulses decreased.   Overall exam appears to be unchanged.  Ulcerations present some of metatarsal stumps of the right third and fifth.  However today there is no significant purulence noted to the sub-metatarsal 3 wound on the right foot.  Granular wound present on the left medial first MPJ without any significant edema, erythema.  There is no drainage or pus on the left side. No pain with calf compression, swelling, warmth, erythema  Assessment: Ulcerations bilateral with osteomyelitis; PAD  Plan: Dressing was changed today.  She has been optimized by vascular surgery.  I discussed with the patient today debridement of the wound and bone biopsy.  She does not want any amputation.  Her granddaughter and son were at bedside and I also contacted the patient's daughter, [name].  After discussion with the patient's daughter and the patient she is in agreement and want to proceed with surgery for bone biopsy.  I discussed the surgery as well as postoperative course.  Discussed risks of surgery including spread of infection, delayed or nonhealing, amputation as well as general risks of surgery including stroke, heart attack.  They understand and wish to proceed.  She is scheduled for surgery tomorrow morning.  N.p.o. after midnight.  Celesta Gentile, DPM

## 2020-12-25 NOTE — Anesthesia Preprocedure Evaluation (Addendum)
Anesthesia Evaluation  Patient identified by MRN, date of birth, ID band Patient awake    Reviewed: Allergy & Precautions, NPO status , Patient's Chart, lab work & pertinent test results  History of Anesthesia Complications Negative for: history of anesthetic complications  Airway Mallampati: III  TM Distance: >3 FB Neck ROM: Full    Dental   Pulmonary asthma , COPD,  COPD inhaler,    Pulmonary exam normal        Cardiovascular hypertension, Pt. on medications + CAD, + Peripheral Vascular Disease, +CHF and + DVT  Normal cardiovascular exam+ dysrhythmias Atrial Fibrillation    '21 TTE - EF 65 to 70%. Mild concentric left ventricular hypertrophy. Grade I diastolic dysfunction (impaired  relaxation). Trivial MR and AI. Mild aortic valve sclerosis is present, with no evidence of aortic valve stenosis.    Neuro/Psych  Headaches, PSYCHIATRIC DISORDERS Depression CVA, Residual Symptoms    GI/Hepatic hiatal hernia, PUD, GERD  Medicated and Controlled,  Endo/Other  diabetes, Insulin DependentHypothyroidism  SIADH   Renal/GU      Musculoskeletal  (+) Arthritis ,   Abdominal   Peds  Hematology  (+) anemia ,  On plavix    Anesthesia Other Findings   Reproductive/Obstetrics                           Anesthesia Physical  Anesthesia Plan  ASA: 4  Anesthesia Plan: General   Post-op Pain Management:    Induction: Intravenous  PONV Risk Score and Plan: 3 and Treatment may vary due to age or medical condition, Ondansetron and TIVA  Airway Management Planned: LMA  Additional Equipment: None  Intra-op Plan:   Post-operative Plan: Extubation in OR  Informed Consent: I have reviewed the patients History and Physical, chart, labs and discussed the procedure including the risks, benefits and alternatives for the proposed anesthesia with the patient or authorized representative who has indicated  his/her understanding and acceptance.   Patient has DNR.  Discussed DNR with patient and Suspend DNR.   Dental advisory given  Plan Discussed with: CRNA and Anesthesiologist  Anesthesia Plan Comments:       Anesthesia Quick Evaluation

## 2020-12-25 NOTE — Progress Notes (Signed)
  Progress Note    12/25/2020 10:10 AM 1 Day Post-Op  Subjective: Discomfort bilateral feet  Vitals:   12/25/20 0827 12/25/20 0856  BP: (!) 162/67   Pulse: 81   Resp: 16   Temp: 97.7 F (36.5 C)   SpO2: 99% 99%    Physical Exam: Awake alert oriented On the respirations Left groin is soft without hematoma Bilateral common femoral pulses are palpable CBC    Component Value Date/Time   WBC 4.5 12/25/2020 0210   RBC 2.70 (L) 12/25/2020 0210   HGB 8.6 (L) 12/25/2020 0210   HGB 9.6 (L) 01/01/2019 1404   HCT 26.2 (L) 12/25/2020 0210   HCT 30.0 (L) 01/01/2019 1404   PLT 190 12/25/2020 0210   PLT 243 01/01/2019 1404   MCV 97.0 12/25/2020 0210   MCV 93 01/01/2019 1404   MCH 31.9 12/25/2020 0210   MCHC 32.8 12/25/2020 0210   RDW 17.2 (H) 12/25/2020 0210   RDW 14.1 01/01/2019 1404   LYMPHSABS 0.5 (L) 12/25/2020 0210   LYMPHSABS 1.3 08/07/2017 1119   MONOABS 0.7 12/25/2020 0210   EOSABS 0.2 12/25/2020 0210   EOSABS 0.4 08/07/2017 1119   BASOSABS 0.0 12/25/2020 0210   BASOSABS 0.1 08/07/2017 1119    BMET    Component Value Date/Time   NA 133 (L) 12/25/2020 0210   NA 137 01/15/2019 1313   K 4.1 12/25/2020 0210   CL 106 12/25/2020 0210   CO2 21 (L) 12/25/2020 0210   GLUCOSE 154 (H) 12/25/2020 0210   BUN 27 (H) 12/25/2020 0210   BUN 58 (H) 01/15/2019 1313   CREATININE 1.00 12/25/2020 0210   CREATININE 1.68 (H) 12/20/2020 1443   CALCIUM 8.9 12/25/2020 0210   GFRNONAA 52 (L) 12/25/2020 0210   GFRNONAA 31 (L) 06/02/2020 1126   GFRAA 36 (L) 06/02/2020 1126    INR    Component Value Date/Time   INR 1.0 10/15/2020 1436     Intake/Output Summary (Last 24 hours) at 12/25/2020 1010 Last data filed at 12/25/2020 0600 Gross per 24 hour  Intake 3017.65 ml  Output 1650 ml  Net 1367.65 ml     Assessment/plan:  87 y.o. female is here with bilateral foot ulceration.  She underwent angiography yesterday.  She is optimized from a vascular standpoint for podiatric  intervention.   Coryn Mosso C. Donzetta Matters, MD [facility] Office: [phone] Pager: [phone]  12/25/2020 10:10 AM

## 2020-12-25 NOTE — Progress Notes (Signed)
PROGRESS NOTE    Shannon [name]  ZOX:[id number] DOB: 1923/11/29 DOA: 12/21/2020 PCP: Sharon Seller, NP   No chief complaint on file.   Brief Narrative:  Shannon [name] is Shannon [name] 87 y.o. female with medical history significant of DM2, HTN, CKD 4, gastric stenosis s/p feeding tube.  She presented to the hospital after an episode of unresponsiveness.  She was brought to hospital by EMS.  She was complaining of bilateral foot pain.    She's being seen for bilateral lower extremity osteo.   Assessment & Plan:   Principal Problem:   Osteomyelitis of left foot (HCC) Active Problems:   Type 2 diabetes mellitus with chronic kidney disease (HCC)   Accelerated hypertension   Acute renal failure superimposed on stage 4 chronic kidney disease (HCC)   Chronic diastolic CHF (congestive heart failure) (HCC)   Osteomyelitis of right foot (HCC)   Diabetic infection of right foot (HCC)   Diabetic foot infection (HCC)  Bilateral Diabetic Foot Infections  Osteomyelitis MRI R foot notable for acute osteo of 3rd metatarsal, bone marrow edema at resection margin of 5th metatarsal, diffuse soft tissue swelling at the distal stump as well as the dorsum of the forefoot.   MRI L foot notable for dislocation and hallux valgus at the first MTP joint with boy erosion particularaly in the head of the first metatarsal medially, but also in the base of the proximal phalanx, with marrow edema in both of the structures - concerning for osteo and possible septic joint.  Transverse fx of head of 3rd metatarsa, ? Superimposed osteo.   R foot ulcer with enterococcus 8 days ago L foot ulcer with pseudomonas 3 months ago Infectious disease consult, appreciate recs - recommending holding abx for now, recommending vascular eval regarding risk of debridement/healing.  Vascular c/s - s/p RLE angiogram 9/23 - per vascular, pt optimized in both lower extremities from vascular surgery standpoint.  ASA, plavix, statin indefinitely.   Podiatry c/s, appreciate recs - considering debridement/biopsy 9/25  PAD S/p L popliteal angioplasty 10/20/20 S/p RLE angiogram 9/23 recommending aspirin indefinitely, plavix indefinitely.  Statin indefinitely.  Vascular c/s, appreciate assistance with PAD and above -> safe to proceed with debridement of foot per podiatry.  AKI on CKD IIIB Baseline creatinine appears to be around 1.45 1.68 at presentation improving  HTN Continue home BP meds  T2DM A1c 5.5 last month Diet controlled Continue BG check  HFpEF Holding lasix given aki on presentation  Gastric stenosis s/p feeding tube Continue tube feeds   DVT prophylaxis: heparin Code Status: DNR Family Communication: none at bedside - daughter over phone 9/21, 9/22 Disposition:   Status is: Inpatient  Remains inpatient appropriate because:Inpatient level of care appropriate due to severity of illness  Dispo: The patient is from: Home              Anticipated d/c is to:  pending              Patient currently is not medically stable to d/c.   Difficult to place patient No       Consultants:  ID podiatry  Procedures:  PROCEDURE:   1) US guided left common femoral artery access 2) Right lower extremity angiogram with second order cannulation (35mL total contrast)  Antimicrobials:  Anti-infectives (From admission, onward)    Start     Dose/Rate Route Frequency Ordered Stop   12/21/20 2200  piperacillin-tazobactam (ZOSYN) IVPB 3.375 g  Status:  Discontinued  3.375 g 12.5 mL/hr over 240 Minutes Intravenous Every 12 hours 12/21/20 2105 12/22/20 1157   12/21/20 2030  vancomycin (VANCOREADY) IVPB 1250 mg/250 mL  Status:  Discontinued        1,250 mg 166.7 mL/hr over 90 Minutes Intravenous  Once 12/21/20 2022 12/21/20 2035          Subjective: No complaints at this time  Objective: Vitals:   12/25/20 0431 12/25/20 0827 12/25/20 0856 12/25/20 1200  BP: (!) 152/65 (!) 162/67  (!) 175/68  Pulse: 78  81  79  Resp: 14 16  19   Temp: 97.7 F (36.5 C) 97.7 F (36.5 C)  97.8 F (36.6 C)  TempSrc: Oral Oral  Oral  SpO2: 99% 99% 99% 100%  Weight: 57.5 kg       Intake/Output Summary (Last 24 hours) at 12/25/2020 1406 Last data filed at 12/25/2020 0600 Gross per 24 hour  Intake 3017.65 ml  Output 700 ml  Net 2317.65 ml   Filed Weights   12/23/20 1436 12/24/20 0500 12/25/20 0431  Weight: 55.6 kg 60.4 kg 57.5 kg    Examination:  General: No acute distress. Cardiovascular: RRR Lungs: unlabored Abdomen: Soft, nontender, nondistended  Neurological: Alert and oriented 3. Moves all extremities 4 . Cranial nerves II through XII grossly intact. Skin: Warm and dry. No rashes or lesions. Extremities: drsesings to lower extremities     Data Reviewed: I have personally reviewed following labs and imaging studies  CBC: Recent Labs  Lab 12/21/20 1030 12/22/20 0340 12/23/20 0111 12/24/20 0424 12/25/20 0210  WBC 4.2 4.4 4.1 3.4* 4.5  NEUTROABS 2.5  --  2.2 1.7 3.0  HGB 9.2* 9.8* 8.8* 9.1* 8.6*  HCT 28.0* 29.3* 26.2* 26.9* 26.2*  MCV 96.9 97.7 95.6 96.8 97.0  PLT 194 196 189 189 190    Basic Metabolic Panel: Recent Labs  Lab 12/21/20 1030 12/22/20 0340 12/23/20 0111 12/24/20 0424 12/25/20 0210  NA 136 138 137 136 133*  K 4.5 5.1 4.1 4.1 4.1  CL 102 106 108 108 106  CO2 24 21* 22 23 21*  GLUCOSE 122* 70 175* 168* 154*  BUN 70* 59* 47* 31* 27*  CREATININE 1.65* 1.49* 1.31* 1.05* 1.00  CALCIUM 9.4 10.0 9.0 8.8* 8.9  MG  --   --  1.9 1.8 1.8  PHOS  --   --  2.7 3.1 2.6    GFR: Estimated Creatinine Clearance: 30.5 mL/min (by C-G formula based on SCr of 1 mg/dL).  Liver Function Tests: Recent Labs  Lab 12/21/20 1030 12/23/20 0111 12/24/20 0424 12/25/20 0210  AST 20 19 20 26   ALT 14 12 12 16   ALKPHOS 76 79 77 75  BILITOT 0.4 0.6 0.6 0.6  PROT 6.9 6.4* 6.2* 6.3*  ALBUMIN 3.0* 2.6* 2.5* 2.6*    CBG: Recent Labs  Lab 12/24/20 2034 12/25/20 0021  12/25/20 0429 12/25/20 0829 12/25/20 1224  GLUCAP 110* 150* 147* 154* 82     Recent Results (from the past 240 hour(s))  Blood culture (routine x 2)     Status: None (Preliminary result)   Collection Time: 12/21/20 10:29 AM   Specimen: BLOOD  Result Value Ref Range Status   Specimen Description BLOOD RIGHT ANTECUBITAL  Final   Special Requests   Final    BOTTLES DRAWN AEROBIC ONLY Blood Culture results may not be optimal due to an inadequate volume of blood received in culture bottles   Culture   Final    NO GROWTH 4  DAYS Performed at [facility], [address], [place], [place] [address]    Report Status PENDING  Incomplete  Blood culture (routine x 2)     Status: None (Preliminary result)   Collection Time: 12/21/20 10:34 AM   Specimen: BLOOD  Result Value Ref Range Status   Specimen Description BLOOD SITE NOT SPECIFIED  Final   Special Requests   Final    BOTTLES DRAWN AEROBIC ONLY Blood Culture results may not be optimal due to an inadequate volume of blood received in culture bottles   Culture   Final    NO GROWTH 4 DAYS Performed at [facility], [address], [place], [place] [address]    Report Status PENDING  Incomplete  Resp Panel by RT-PCR (Flu Ritesh Opara&B, Covid) Urine, Clean Catch     Status: None   Collection Time: 12/21/20  8:34 PM   Specimen: Urine, Clean Catch; Nasopharyngeal(NP) swabs in vial transport medium  Result Value Ref Range Status   SARS Coronavirus 2 by RT PCR NEGATIVE NEGATIVE Final    Comment: (NOTE) SARS-CoV-2 target nucleic acids are NOT DETECTED.  The SARS-CoV-2 RNA is generally detectable in upper respiratory specimens during the acute phase of infection. The lowest concentration of SARS-CoV-2 viral copies this assay can detect is 138 copies/mL. Liridona Mashaw negative result does not preclude SARS-Cov-2 infection and should not be used as the sole basis for treatment or other patient management decisions. Jariana Shumard negative result may occur with   improper specimen collection/handling, submission of specimen other than nasopharyngeal swab, presence of viral mutation(s) within the areas targeted by this assay, and inadequate number of viral copies(<138 copies/mL). Shannon [name] negative result must be combined with clinical observations, patient history, and epidemiological information. The expected result is Negative.  Fact Sheet for Patients:  BloggerCourse.com  Fact Sheet for Healthcare Providers:  SeriousBroker.it  This test is no t yet approved or cleared by the [place] FDA and  has been authorized for detection and/or diagnosis of SARS-CoV-2 by FDA under an Emergency Use Authorization (EUA). This EUA will remain  in effect (meaning this test can be used) for the duration of the COVID-19 declaration under Section 564(b)(1) of the Act, 21 U.S.C.section 360bbb-3(b)(1), unless the authorization is terminated  or revoked sooner.       Influenza Shannon [name] by PCR NEGATIVE NEGATIVE Final   Influenza B by PCR NEGATIVE NEGATIVE Final    Comment: (NOTE) The Xpert Xpress SARS-CoV-2/FLU/RSV plus assay is intended as an aid in the diagnosis of influenza from Nasopharyngeal swab specimens and should not be used as Shannon [name] sole basis for treatment. Nasal washings and aspirates are unacceptable for Xpert Xpress SARS-CoV-2/FLU/RSV testing.  Fact Sheet for Patients: BloggerCourse.com  Fact Sheet for Healthcare Providers: SeriousBroker.it  This test is not yet approved or cleared by the [place] FDA and has been authorized for detection and/or diagnosis of SARS-CoV-2 by FDA under an Emergency Use Authorization (EUA). This EUA will remain in effect (meaning this test can be used) for the duration of the COVID-19 declaration under Section 564(b)(1) of the Act, 21 U.S.C. section 360bbb-3(b)(1), unless the authorization is terminated  or revoked.  Performed at [facility], [address], [place], [place] [address]   MRSA Next Gen by PCR, Nasal     Status: None   Collection Time: 12/22/20  1:00 AM   Specimen: Nasal Mucosa; Nasal Swab  Result Value Ref Range Status   MRSA by PCR Next Gen NOT  DETECTED NOT DETECTED Final    Comment: (NOTE) The GeneXpert MRSA Assay (FDA approved for NASAL specimens only), is one component of Shannon [name] comprehensive MRSA colonization surveillance program. It is not intended to diagnose MRSA infection nor to guide or monitor treatment for MRSA infections. Test performance is not FDA approved in patients less than 85 years old. Performed at [facility], [address], [place], [place] [address]          Radiology Studies: PERIPHERAL VASCULAR CATHETERIZATION  Result Date: 12/24/2020 DATE OF SERVICE: 12/24/2020  PATIENT:  Shannon [name]  87 y.o. female  PRE-OPERATIVE DIAGNOSIS:  Atherosclerosis of native arteries of right lower extremity causing ulceration and osteomyelitis  POST-OPERATIVE DIAGNOSIS:  Same  PROCEDURE:  1) US guided left common femoral artery access 2) Right lower extremity angiogram with second order cannulation (35mL total contrast)  SURGEON:  Shannon [name]. Lenell Antu, MD  ASSISTANT: none  ANESTHESIA:   local  ESTIMATED BLOOD LOSS: min  LOCAL MEDICATIONS USED:  LIDOCAINE  COUNTS: confirmed correct.  PATIENT DISPOSITION:  PACU - hemodynamically stable.  Delay start of Pharmacological VTE agent (>24hrs) due to surgical blood loss or risk of bleeding: no  INDICATION FOR PROCEDURE: Shannon [name] is Shannon [name] 87 y.o. female with bilateral lower extremity ulceration and osteomyelitis.  She underwent angiography with me in July which treated Shannon [name] left popliteal lesion.  She had much improved flow to her foot.  She has seen some healing of Shannon Zahradnik left foot ulcer, but has underlying osteomyelitis still.  Recent MRI shows that she also has right-sided osteomyelitis with ulceration.  As I did not evaluate  her right lower extremity during angiography in July, I offered her repeat angiography.  The patient was amenable and wished to proceed.  OPERATIVE FINDINGS:  Right lower extremity: Common femoral artery: widely patent without flow limiting stenosis Profunda femoris artery:  widely patent without flow limiting stenosis Superficial femoral artery: Diffusely diseased, approximately 50% calcified stenosis Popliteal artery: diffusely diseased Anterior tibial artery: occluded Tibioperoneal trunk: widely patent without flow limiting stenosis Peroneal artery: widely patent without flow limiting stenosis Posterior tibial artery: occluded Pedal circulation: fills via robust collateralization from peroneal  DESCRIPTION OF PROCEDURE: After identification of the patient in the pre-operative holding area, the patient was transferred to the operating room. The patient was positioned supine on the operating room table. Anesthesia was induced. The groins was prepped and draped in standard fashion. Shannon [name] surgical pause was performed confirming correct patient, procedure, and operative location.  The left groin was anesthetized with subcutaneous injection of 1% lidocaine. Using ultrasound guidance, the left common femoral artery was accessed with micropuncture technique. Fluoroscopy was used to confirm cannulation over the femoral head. The 66F sheath was upsized to 39F.  Kenson Groh Benson wire was advanced into the distal aorta. Over the wire an omni flush catheter was advanced to the level of L2. Aortogram was performed - see above for details.  The right common iliac artery was selected with Jahred Tatar Bentson guidewire. The wire was advanced into the common femoral artery. Over the wire the omni flush catheter was advanced into the external iliac artery. Selective angiography was performed - see above for details.  The sheath was left in place with plans to remove the sheath in PACU.  Upon completion of the case instrument and sharps counts were  confirmed correct. The patient was transferred to the PACU in good condition. I was present for all portions of the procedure.  PLAN: Patient is  optimized in both lower extremities from vascular surgery standpoint.  Continue aspirin, Plavix, statin therapy indefinitely.  Safe to proceed with foot debridement as necessary per podiatry team.  Shannon [name]. Lenell Antu, MD [facility] Phone Number: [phone] 12/24/2020 4:36 PM        Scheduled Meds:  acetaminophen  1,000 mg Oral Q8H   amLODipine  5 mg Oral Daily   artificial tears  1 application Both Eyes QHS   aspirin EC  81 mg Oral Daily   atorvastatin  10 mg Oral Daily   clopidogrel  75 mg Oral Daily   cycloSPORINE  1 drop Both Eyes BID   DULoxetine  30 mg Oral Daily   ferrous sulfate  325 mg Oral Daily   fluticasone  2 spray Each Nare Daily   free water  180 mL Per Tube TID   gabapentin  300 mg Oral BID   heparin  5,000 Units Subcutaneous Q8H   hydrALAZINE  50 mg Oral TID   isosorbide mononitrate  60 mg Oral Daily   latanoprost  1 drop Both Eyes QHS   levothyroxine  125 mcg Oral QAC breakfast   loratadine  10 mg Oral Daily   mometasone-formoterol  2 puff Inhalation BID   multivitamin with minerals  1 tablet Oral Daily   nystatin cream  1 application Topical BID   pantoprazole  40 mg Oral BID   povidone-iodine   Topical Q1400   senna-docusate  1 tablet Oral QHS   sodium chloride flush  3 mL Intravenous Q12H   Continuous Infusions:  sodium chloride 100 mL/hr at 12/24/20 0306   sodium chloride     feeding supplement (OSMOLITE 1.5 CAL) 65 mL/hr at 12/24/20 2054     LOS: 4 days    Time spent: over 30 min    Lacretia Nicks, MD [facility]   To contact the attending provider between 7A-7P or the covering provider during after hours 7P-7A, please log into the web site [email or web address] and access using universal [place] password for that web site. If you do not have the password,  please call the hospital operator.  12/25/2020, 2:06 PM

## 2020-12-26 ENCOUNTER — Encounter (HOSPITAL_COMMUNITY)
Admission: EM | Disposition: A | Discharge status: Home Health | Payer: Self-pay | Source: Home / Self Care | Attending: Family Medicine

## 2020-12-26 ENCOUNTER — Encounter (HOSPITAL_COMMUNITY): Payer: Self-pay | Admitting: Internal Medicine

## 2020-12-26 ENCOUNTER — Inpatient Hospital Stay (HOSPITAL_COMMUNITY): Payer: Medicare Other | Admitting: Anesthesiology

## 2020-12-26 DIAGNOSIS — M86172 Other acute osteomyelitis, left ankle and foot: Secondary | ICD-10-CM

## 2020-12-26 DIAGNOSIS — M86171 Other acute osteomyelitis, right ankle and foot: Secondary | ICD-10-CM

## 2020-12-26 HISTORY — PX: IRRIGATION AND DEBRIDEMENT FOOT: SHX6602

## 2020-12-26 LAB — CBC WITH DIFFERENTIAL/PLATELET
Abs Immature Granulocytes: 0.02 10*3/uL (ref 0.00–0.07)
Basophils Absolute: 0 10*3/uL (ref 0.0–0.1)
Basophils Relative: 1 %
Eosinophils Absolute: 0.2 10*3/uL (ref 0.0–0.5)
Eosinophils Relative: 4 %
HCT: 26.1 % — ABNORMAL LOW (ref 36.0–46.0)
Hemoglobin: 8.9 g/dL — ABNORMAL LOW (ref 12.0–15.0)
Immature Granulocytes: 0 %
Lymphocytes Relative: 15 %
Lymphs Abs: 0.7 10*3/uL (ref 0.7–4.0)
MCH: 32.2 pg (ref 26.0–34.0)
MCHC: 34.1 g/dL (ref 30.0–36.0)
MCV: 94.6 fL (ref 80.0–100.0)
Monocytes Absolute: 0.8 10*3/uL (ref 0.1–1.0)
Monocytes Relative: 17 %
Neutro Abs: 2.8 10*3/uL (ref 1.7–7.7)
Neutrophils Relative %: 63 %
Platelets: 175 10*3/uL (ref 150–400)
RBC: 2.76 MIL/uL — ABNORMAL LOW (ref 3.87–5.11)
RDW: 17.1 % — ABNORMAL HIGH (ref 11.5–15.5)
WBC: 4.6 10*3/uL (ref 4.0–10.5)
nRBC: 0 % (ref 0.0–0.2)

## 2020-12-26 LAB — CULTURE, BLOOD (ROUTINE X 2)
Culture: NO GROWTH
Culture: NO GROWTH

## 2020-12-26 LAB — MAGNESIUM: Magnesium: 1.8 mg/dL (ref 1.7–2.4)

## 2020-12-26 LAB — COMPREHENSIVE METABOLIC PANEL
ALT: 25 U/L (ref 0–44)
AST: 40 U/L (ref 15–41)
Albumin: 2.5 g/dL — ABNORMAL LOW (ref 3.5–5.0)
Alkaline Phosphatase: 75 U/L (ref 38–126)
Anion gap: 6 (ref 5–15)
BUN: 26 mg/dL — ABNORMAL HIGH (ref 8–23)
CO2: 23 mmol/L (ref 22–32)
Calcium: 9.1 mg/dL (ref 8.9–10.3)
Chloride: 102 mmol/L (ref 98–111)
Creatinine, Ser: 1.05 mg/dL — ABNORMAL HIGH (ref 0.44–1.00)
GFR, Estimated: 49 mL/min — ABNORMAL LOW (ref >60)
Glucose, Bld: 87 mg/dL (ref 70–99)
Potassium: 4.7 mmol/L (ref 3.5–5.1)
Sodium: 131 mmol/L — ABNORMAL LOW (ref 135–145)
Total Bilirubin: 0.7 mg/dL (ref 0.3–1.2)
Total Protein: 6.2 g/dL — ABNORMAL LOW (ref 6.5–8.1)

## 2020-12-26 LAB — PHOSPHORUS: Phosphorus: 3.6 mg/dL (ref 2.5–4.6)

## 2020-12-26 LAB — GLUCOSE, CAPILLARY
Glucose-Capillary: 153 mg/dL — ABNORMAL HIGH (ref 70–99)
Glucose-Capillary: 157 mg/dL — ABNORMAL HIGH (ref 70–99)
Glucose-Capillary: 162 mg/dL — ABNORMAL HIGH (ref 70–99)
Glucose-Capillary: 81 mg/dL (ref 70–99)
Glucose-Capillary: 88 mg/dL (ref 70–99)
Glucose-Capillary: 94 mg/dL (ref 70–99)

## 2020-12-26 SURGERY — DEBRIDEMENT, WOUND
Anesthesia: Choice | Laterality: Bilateral

## 2020-12-26 SURGERY — IRRIGATION AND DEBRIDEMENT FOOT
Anesthesia: General | Site: Foot | Laterality: Bilateral

## 2020-12-26 MED ORDER — PROPOFOL 10 MG/ML IV BOLUS
INTRAVENOUS | Status: AC
  Filled 2020-12-26: qty 20

## 2020-12-26 MED ORDER — ONDANSETRON HCL 4 MG/2ML IJ SOLN
INTRAMUSCULAR | Status: AC
  Filled 2020-12-26: qty 2

## 2020-12-26 MED ORDER — VANCOMYCIN HCL IN DEXTROSE 1-5 GM/200ML-% IV SOLN
1000.0000 mg | INTRAVENOUS | Status: DC
  Administered 2020-12-28: 1000 mg via INTRAVENOUS
  Filled 2020-12-26: qty 200

## 2020-12-26 MED ORDER — SODIUM CHLORIDE 0.9 % IV SOLN
2.0000 g | INTRAVENOUS | Status: DC
  Administered 2020-12-26 – 2020-12-28 (×3): 2 g via INTRAVENOUS
  Filled 2020-12-26 (×3): qty 2

## 2020-12-26 MED ORDER — SENNOSIDES-DOCUSATE SODIUM 8.6-50 MG PO TABS
1.0000 | ORAL_TABLET | Freq: Once | ORAL | Status: AC
  Administered 2020-12-26: 1 via ORAL
  Filled 2020-12-26: qty 1

## 2020-12-26 MED ORDER — FENTANYL CITRATE (PF) 100 MCG/2ML IJ SOLN
INTRAMUSCULAR | Status: DC | PRN
  Administered 2020-12-26: 25 ug via INTRAVENOUS

## 2020-12-26 MED ORDER — POLYETHYLENE GLYCOL 3350 17 G PO PACK
17.0000 g | PACK | Freq: Two times a day (BID) | ORAL | Status: DC
  Administered 2020-12-26 – 2020-12-27 (×3): 17 g via ORAL
  Filled 2020-12-26 (×7): qty 1

## 2020-12-26 MED ORDER — FENTANYL CITRATE (PF) 100 MCG/2ML IJ SOLN
INTRAMUSCULAR | Status: AC
  Filled 2020-12-26: qty 2

## 2020-12-26 MED ORDER — LACTATED RINGERS IV SOLN: INTRAVENOUS | Status: DC

## 2020-12-26 MED ORDER — DEXAMETHASONE SODIUM PHOSPHATE 10 MG/ML IJ SOLN
INTRAMUSCULAR | Status: DC | PRN
  Administered 2020-12-26: 4 mg via INTRAVENOUS

## 2020-12-26 MED ORDER — VANCOMYCIN HCL 1000 MG IV SOLR
INTRAVENOUS | Status: AC
  Filled 2020-12-26: qty 20

## 2020-12-26 MED ORDER — 0.9 % SODIUM CHLORIDE (POUR BTL) OPTIME
TOPICAL | Status: DC | PRN
  Administered 2020-12-26: 1000 mL

## 2020-12-26 MED ORDER — CHLORHEXIDINE GLUCONATE 0.12 % MT SOLN: 15.0000 mL | Freq: Once | OROMUCOSAL | Status: AC

## 2020-12-26 MED ORDER — LIDOCAINE HCL 2 % IJ SOLN
INTRAMUSCULAR | Status: DC | PRN
  Administered 2020-12-26: 5 mL

## 2020-12-26 MED ORDER — OXYCODONE HCL 5 MG PO TABS: 5.0000 mg | ORAL_TABLET | Freq: Once | ORAL | Status: DC | PRN

## 2020-12-26 MED ORDER — SODIUM CHLORIDE 0.9 % IR SOLN
Status: DC | PRN
  Administered 2020-12-26: 1000 mL

## 2020-12-26 MED ORDER — DEXAMETHASONE SODIUM PHOSPHATE 10 MG/ML IJ SOLN
INTRAMUSCULAR | Status: AC
  Filled 2020-12-26: qty 1

## 2020-12-26 MED ORDER — ONDANSETRON HCL 4 MG/2ML IJ SOLN: 4.0000 mg | Freq: Once | INTRAMUSCULAR | Status: DC | PRN

## 2020-12-26 MED ORDER — PROPOFOL 10 MG/ML IV BOLUS
INTRAVENOUS | Status: DC | PRN
  Administered 2020-12-26: 50 mg via INTRAVENOUS

## 2020-12-26 MED ORDER — CHLORHEXIDINE GLUCONATE CLOTH 2 % EX PADS: 6.0000 | MEDICATED_PAD | Freq: Once | CUTANEOUS | Status: DC

## 2020-12-26 MED ORDER — PHENYLEPHRINE 40 MCG/ML (10ML) SYRINGE FOR IV PUSH (FOR BLOOD PRESSURE SUPPORT)
PREFILLED_SYRINGE | INTRAVENOUS | Status: AC
  Filled 2020-12-26: qty 10

## 2020-12-26 MED ORDER — ONDANSETRON HCL 4 MG/2ML IJ SOLN
INTRAMUSCULAR | Status: DC | PRN
  Administered 2020-12-26: 4 mg via INTRAVENOUS

## 2020-12-26 MED ORDER — PHENYLEPHRINE 40 MCG/ML (10ML) SYRINGE FOR IV PUSH (FOR BLOOD PRESSURE SUPPORT)
PREFILLED_SYRINGE | INTRAVENOUS | Status: DC | PRN
  Administered 2020-12-26 (×2): 40 ug via INTRAVENOUS

## 2020-12-26 MED ORDER — BUPIVACAINE HCL 0.5 % IJ SOLN
INTRAMUSCULAR | Status: AC
  Filled 2020-12-26: qty 1

## 2020-12-26 MED ORDER — LIDOCAINE HCL (PF) 2 % IJ SOLN
INTRAMUSCULAR | Status: AC
  Filled 2020-12-26: qty 5

## 2020-12-26 MED ORDER — OXYCODONE HCL 5 MG/5ML PO SOLN: 5.0000 mg | Freq: Once | ORAL | Status: DC | PRN

## 2020-12-26 MED ORDER — CHLORHEXIDINE GLUCONATE 0.12 % MT SOLN
OROMUCOSAL | Status: AC
  Administered 2020-12-26: 15 mL via OROMUCOSAL
  Filled 2020-12-26: qty 15

## 2020-12-26 MED ORDER — FENTANYL CITRATE (PF) 100 MCG/2ML IJ SOLN: 25.0000 ug | INTRAMUSCULAR | Status: DC | PRN

## 2020-12-26 MED ORDER — LIDOCAINE 2% (20 MG/ML) 5 ML SYRINGE
INTRAMUSCULAR | Status: DC | PRN
  Administered 2020-12-26: 40 mg via INTRAVENOUS

## 2020-12-26 MED ORDER — VANCOMYCIN HCL 1250 MG/250ML IV SOLN
1250.0000 mg | Freq: Once | INTRAVENOUS | Status: AC
  Administered 2020-12-26: 1250 mg via INTRAVENOUS
  Filled 2020-12-26 (×2): qty 250

## 2020-12-26 MED ORDER — PHENYLEPHRINE HCL-NACL 20-0.9 MG/250ML-% IV SOLN
INTRAVENOUS | Status: DC | PRN
  Administered 2020-12-26: 25 ug/min via INTRAVENOUS

## 2020-12-26 MED ORDER — BUPIVACAINE HCL (PF) 0.5 % IJ SOLN
INTRAMUSCULAR | Status: DC | PRN
  Administered 2020-12-26: 5 mL

## 2020-12-26 MED ORDER — LIDOCAINE HCL 2 % IJ SOLN
INTRAMUSCULAR | Status: AC
  Filled 2020-12-26: qty 20

## 2020-12-26 MED ORDER — ORAL CARE MOUTH RINSE: 15.0000 mL | Freq: Once | OROMUCOSAL | Status: AC

## 2020-12-26 SURGICAL SUPPLY — 36 items
BAG COUNTER SPONGE SURGICOUNT (BAG) ×2 IMPLANT
BNDG ELASTIC 4X5.8 VLCR STR LF (GAUZE/BANDAGES/DRESSINGS) ×2 IMPLANT
BNDG ESMARK 4X9 LF (GAUZE/BANDAGES/DRESSINGS) IMPLANT
BNDG GAUZE ELAST 4 BULKY (GAUZE/BANDAGES/DRESSINGS) ×4 IMPLANT
CHLORAPREP W/TINT 26 (MISCELLANEOUS) ×2 IMPLANT
COVER SURGICAL LIGHT HANDLE (MISCELLANEOUS) ×2 IMPLANT
DRAPE U-SHAPE 47X51 STRL (DRAPES) ×2 IMPLANT
DRSG XEROFORM 1X8 (GAUZE/BANDAGES/DRESSINGS) ×2 IMPLANT
ELECT CAUTERY BLADE 6.4 (BLADE) ×2 IMPLANT
ELECT REM PT RETURN 9FT ADLT (ELECTROSURGICAL) ×2
ELECTRODE REM PT RTRN 9FT ADLT (ELECTROSURGICAL) ×1 IMPLANT
GAUZE SPONGE 4X4 12PLY STRL (GAUZE/BANDAGES/DRESSINGS) ×4 IMPLANT
GLOVE SRG 8 PF TXTR STRL LF DI (GLOVE) ×1 IMPLANT
GLOVE SURG ENC MOIS LTX SZ7.5 (GLOVE) ×2 IMPLANT
GLOVE SURG UNDER POLY LF SZ8 (GLOVE) ×2
GOWN STRL REUS W/ TWL LRG LVL3 (GOWN DISPOSABLE) ×1 IMPLANT
GOWN STRL REUS W/ TWL XL LVL3 (GOWN DISPOSABLE) ×1 IMPLANT
GOWN STRL REUS W/TWL LRG LVL3 (GOWN DISPOSABLE) ×2
GOWN STRL REUS W/TWL XL LVL3 (GOWN DISPOSABLE) ×2
KIT BASIN OR (CUSTOM PROCEDURE TRAY) ×2 IMPLANT
KIT TURNOVER KIT B (KITS) ×2 IMPLANT
MANIFOLD NEPTUNE II (INSTRUMENTS) ×2 IMPLANT
NEEDLE BIOPSY JAMSHIDI 8X6 (NEEDLE) ×2 IMPLANT
NEEDLE HYPO 25GX1X1/2 BEV (NEEDLE) ×2 IMPLANT
NS IRRIG 1000ML POUR BTL (IV SOLUTION) ×2 IMPLANT
PACK ORTHO EXTREMITY (CUSTOM PROCEDURE TRAY) ×2 IMPLANT
PROBE DEBRIDE SONICVAC MISONIX (TIP) ×2 IMPLANT
SET CYSTO W/LG BORE CLAMP LF (SET/KITS/TRAYS/PACK) ×2 IMPLANT
SOL PREP POV-IOD 4OZ 10% (MISCELLANEOUS) ×4 IMPLANT
SUT ETHILON 3 0 FSL (SUTURE) ×2 IMPLANT
SYR CONTROL 10ML LL (SYRINGE) ×2 IMPLANT
TOWEL GREEN STERILE (TOWEL DISPOSABLE) ×2 IMPLANT
TOWEL GREEN STERILE FF (TOWEL DISPOSABLE) ×2 IMPLANT
TUBE CONNECTING 12X1/4 (SUCTIONS) ×4 IMPLANT
TUBE IRRIGATION SET MISONIX (TUBING) ×2 IMPLANT
YANKAUER SUCT BULB TIP NO VENT (SUCTIONS) ×2 IMPLANT

## 2020-12-26 NOTE — Progress Notes (Signed)
Pt transported by PACU staff to OR for scheduled surgery this am. Pt stable at the time of transport.

## 2020-12-26 NOTE — Progress Notes (Signed)
Patient seen in pre-op for surgery today. Scheduled for bilateral ulcer debridement, bone biopsy. We again discussed the surgery and post-op course. Alternatives, risks, complications discussed. No further questions or concerns. Consent signed.

## 2020-12-26 NOTE — Progress Notes (Signed)
Id brief note  Vascular study done rle no intervention; good collateral flow to foot Patient had I&D of bilateral feet today with culture sent/bone bx   -ok to start empiric vanc/cefepime -f/u culture and biopsy

## 2020-12-26 NOTE — Progress Notes (Signed)
Pharmacy Antibiotic Note  Shannon [name] is a 87 y.o. female admitted on 12/21/2020 after an episode of unresponsiveness and complaints of bilateral foot pain diagnosed as bilateral diabetic foot infections likely progressing to osteomyelitis. Pharmacy has been consulted for empiric cefepime and vancomycin dosing.  Scr today 1.05 (CrCl 28), baseline around 1.2-1.4, which is improved from presentation on 12/21/20. WBC today 4.6 and WNL and patient remains afebrile (Tmax 98.3 F), cultures pending.   Plan: Vancomycin 1250 mg IV x 1 Vancomycin 1000 mg IV q 48 (estimated AUC 429.9) Cefepime 2g IV q 24 h Monitor renal function closely Monitor signs of clinical improvement Will follow cultures and antibiotic plan  Height: 5\' 6"  (167.6 cm) Weight: 57.2 kg (126 lb) IBW/kg (Calculated) : 59.3  Temp (24hrs), Avg:97.9 F (36.6 C), Min:97.3 F (36.3 C), Max:98.3 F (36.8 C)  Recent Labs  Lab 12/21/20 1030 12/22/20 0340 12/23/20 0111 12/24/20 0424 12/25/20 0210 12/26/20 0220  WBC 4.2 4.4 4.1 3.4* 4.5 4.6  CREATININE 1.65* 1.49* 1.31* 1.05* 1.00 1.05*  LATICACIDVEN 0.7  --   --   --   --   --     Estimated Creatinine Clearance: 28.9 mL/min (A) (by C-G formula based on SCr of 1.05 mg/dL (H)).    Allergies  Allergen Reactions   Penicillins Itching, Rash and Other (See Comments)    Tolerated amoxicillin, unasyn, zosyn & cephalosporins in the past. Did it involve swelling of the face/tongue/throat, SOB, or low BP? No Did it involve sudden or severe rash/hives, skin peeling, or any reaction on the inside of your mouth or nose? No Did you need to seek medical attention at a hospital or doctor's office? Unknown When did it last happen?      5+ years If all above answers are "NO", may proceed with cephalosporin use.     Antimicrobials this admission: Cefepime 9/25 >> Vanc 9/25 >> Zosyn x 1 9/20  Dose adjustments this admission: none  Microbiology results: Aerobic deep wound culture  pending 12/22/20 MRSA PCR: negative  Thank you for involving pharmacy in this patient's care.  Elita Quick, PharmD PGY1 Ambulatory Care Pharmacy Resident 12/26/2020 11:53 AM  **Pharmacist phone directory can be found on [email or web address] listed under [place]**

## 2020-12-26 NOTE — Transfer of Care (Signed)
Immediate Anesthesia Transfer of Care Note  Patient: Shannon [name]  Procedure(s) Performed: IRRIGATION AND DEBRIDEMENT FOOT AND BONE BIOPSY (Bilateral)  Patient Location: PACU  Anesthesia Type:General  Level of Consciousness: drowsy and patient cooperative  Airway & Oxygen Therapy: Patient Spontanous Breathing and Patient connected to face mask oxygen  Post-op Assessment: Report given to RN and Post -op Vital signs reviewed and stable  Post vital signs: Reviewed and stable  Last Vitals:  Vitals Value Taken Time  BP 181/72 12/26/20 0839  Temp    Pulse 81 12/26/20 0841  Resp 14 12/26/20 0841  SpO2 100 % 12/26/20 0841  Vitals shown include unvalidated device data.  Last Pain:  Vitals:   12/26/20 0736  TempSrc: Oral  PainSc: 0-No pain      Patients Stated Pain Goal: 0 (57/01/77 9390)  Complications: No notable events documented.

## 2020-12-26 NOTE — Anesthesia Procedure Notes (Signed)
Procedure Name: LMA Insertion Date/Time: 12/26/2020 7:51 AM Performed by: Renato Shin, CRNA Pre-anesthesia Checklist: Patient identified, Emergency Drugs available, Suction available and Patient being monitored Patient Re-evaluated:Patient Re-evaluated prior to induction Oxygen Delivery Method: Circle system utilized Preoxygenation: Pre-oxygenation with 100% oxygen Induction Type: IV induction LMA: LMA inserted LMA Size: 3.0 Number of attempts: 1 Placement Confirmation: positive ETCO2 and breath sounds checked- equal and bilateral Tube secured with: Tape Dental Injury: Teeth and Oropharynx as per pre-operative assessment

## 2020-12-26 NOTE — Anesthesia Postprocedure Evaluation (Signed)
Anesthesia Post Note  Patient: Shannon [name]  Procedure(s) Performed: IRRIGATION AND DEBRIDEMENT FOOT AND BONE BIOPSY (Bilateral: Foot)     Patient location during evaluation: PACU Anesthesia Type: General Level of consciousness: awake and alert Pain management: pain level controlled Vital Signs Assessment: post-procedure vital signs reviewed and stable Respiratory status: spontaneous breathing, nonlabored ventilation and respiratory function stable Cardiovascular status: stable and blood pressure returned to baseline Anesthetic complications: no   No notable events documented.  Last Vitals:  Vitals:   12/26/20 0926 12/26/20 0944  BP:  (!) 189/75  Pulse: 75 84  Resp: 14 20  Temp: 36.8 C 36.8 C  SpO2: 100% 100%    Last Pain:  Vitals:   12/26/20 0944  TempSrc: Oral  PainSc: 0-No pain                 Audry Pili

## 2020-12-26 NOTE — Progress Notes (Addendum)
PROGRESS NOTE    Shannon [name]  ZOX:[id number] DOB: 1924-03-24 DOA: 12/21/2020 PCP: Sharon Seller, NP   No chief complaint on file.   Brief Narrative:  Shannon [name] is Shannon [name] 87 y.o. female with medical history significant of DM2, HTN, CKD 4, gastric stenosis s/p feeding tube.  She presented to the hospital after an episode of unresponsiveness.  She was brought to hospital by EMS.  She was complaining of bilateral foot pain.    She's being seen for bilateral lower extremity osteo.   Assessment & Plan:   Principal Problem:   Osteomyelitis of left foot (HCC) Active Problems:   Type 2 diabetes mellitus with chronic kidney disease (HCC)   Accelerated hypertension   Acute renal failure superimposed on stage 4 chronic kidney disease (HCC)   Chronic diastolic CHF (congestive heart failure) (HCC)   Osteomyelitis of right foot (HCC)   Diabetic infection of right foot (HCC)   Diabetic foot infection (HCC)  Bilateral Diabetic Foot Infections  Osteomyelitis MRI R foot notable for acute osteo of 3rd metatarsal, bone marrow edema at resection margin of 5th metatarsal, diffuse soft tissue swelling at the distal stump as well as the dorsum of the forefoot.   MRI L foot notable for dislocation and hallux valgus at the first MTP joint with boy erosion particularaly in the head of the first metatarsal medially, but also in the base of the proximal phalanx, with marrow edema in both of the structures - concerning for osteo and possible septic joint.  Transverse fx of head of 3rd metatarsa, ? Superimposed osteo.   R foot ulcer with enterococcus 8 days ago L foot ulcer with pseudomonas 3 months ago Infectious disease consult, appreciate recs - recommending holding abx for now, recommending vascular eval regarding risk of debridement/healing.  Will start broad spectrum abx, vanc/cefepime. Vascular c/s - s/p RLE angiogram 9/23 - per vascular, pt optimized in both lower extremities from vascular surgery  standpoint.  ASA, plavix, statin indefinitely.  Podiatry c/s, appreciate recs - s/p irrigation and debridement, foot and bone biopsy (bilateral) 9/25 -> follow pending cultures  PAD S/p L popliteal angioplasty 10/20/20 S/p RLE angiogram 9/23 recommending aspirin indefinitely, plavix indefinitely.  Statin indefinitely.  Vascular c/s, appreciate assistance with PAD and above -> safe to proceed with debridement of foot per podiatry.  AKI on CKD IIIB Baseline creatinine appears to be around 1.45 1.68 at presentation Better than recent baseline  HTN Continue home BP meds  T2DM A1c 5.5 last month Diet controlled Continue BG check  HFpEF Holding lasix given aki on presentation  Gastric stenosis s/p feeding tube Continue tube feeds   DVT prophylaxis: heparin Code Status: DNR Family Communication: none at bedside - daughter over phone 9/21, 9/22 Disposition:   Status is: Inpatient  Remains inpatient appropriate because:Inpatient level of care appropriate due to severity of illness  Dispo: The patient is from: Home              Anticipated d/c is to:  pending              Patient currently is not medically stable to d/c.   Difficult to place patient No       Consultants:  ID podiatry  Procedures:  PROCEDURE:   1) US guided left common femoral artery access 2) Right lower extremity angiogram with second order cannulation (35mL total contrast)  Irrigation and debridement, foot and bone biopsy  Antimicrobials:  Anti-infectives (From admission, onward)  Start     Dose/Rate Route Frequency Ordered Stop   12/21/20 2200  piperacillin-tazobactam (ZOSYN) IVPB 3.375 g  Status:  Discontinued        3.375 g 12.5 mL/hr over 240 Minutes Intravenous Every 12 hours 12/21/20 2105 12/22/20 1157   12/21/20 2030  vancomycin (VANCOREADY) IVPB 1250 mg/250 mL  Status:  Discontinued        1,250 mg 166.7 mL/hr over 90 Minutes Intravenous  Once 12/21/20 2022 12/21/20 2035           Subjective: No complaints post op   Objective: Vitals:   12/26/20 0915 12/26/20 0924 12/26/20 0926 12/26/20 0944  BP:  (!) 171/70  (!) 189/75  Pulse: 75  75 84  Resp: 13  14 20   Temp:   98.3 F (36.8 C) 98.2 F (36.8 C)  TempSrc:    Oral  SpO2: 100%  100% 100%  Weight:      Height:        Intake/Output Summary (Last 24 hours) at 12/26/2020 1040 Last data filed at 12/26/2020 4098 Gross per 24 hour  Intake 350 ml  Output 1625 ml  Net -1275 ml   Filed Weights   12/24/20 0500 12/25/20 0431 12/26/20 0736  Weight: 60.4 kg 57.5 kg 57.2 kg    Examination:  General: No acute distress. Cardiovascular: RRR Lungs: unlabored Abdomen: Soft, nontender, nondistended  Neurological: Alert and oriented 3. Moves all extremities 4 . Cranial nerves II through XII grossly intact. Skin: Warm and dry. No rashes or lesions. Extremities: dressings to bilateral LE's     Data Reviewed: I have personally reviewed following labs and imaging studies  CBC: Recent Labs  Lab 12/21/20 1030 12/22/20 0340 12/23/20 0111 12/24/20 0424 12/25/20 0210 12/26/20 0220  WBC 4.2 4.4 4.1 3.4* 4.5 4.6  NEUTROABS 2.5  --  2.2 1.7 3.0 2.8  HGB 9.2* 9.8* 8.8* 9.1* 8.6* 8.9*  HCT 28.0* 29.3* 26.2* 26.9* 26.2* 26.1*  MCV 96.9 97.7 95.6 96.8 97.0 94.6  PLT 194 196 189 189 190 175    Basic Metabolic Panel: Recent Labs  Lab 12/22/20 0340 12/23/20 0111 12/24/20 0424 12/25/20 0210 12/26/20 0220  NA 138 137 136 133* 131*  K 5.1 4.1 4.1 4.1 4.7  CL 106 108 108 106 102  CO2 21* 22 23 21* 23  GLUCOSE 70 175* 168* 154* 87  BUN 59* 47* 31* 27* 26*  CREATININE 1.49* 1.31* 1.05* 1.00 1.05*  CALCIUM 10.0 9.0 8.8* 8.9 9.1  MG  --  1.9 1.8 1.8 1.8  PHOS  --  2.7 3.1 2.6 3.6    GFR: Estimated Creatinine Clearance: 28.9 mL/min (Shalisa Mcquade) (by C-G formula based on SCr of 1.05 mg/dL (H)).  Liver Function Tests: Recent Labs  Lab 12/21/20 1030 12/23/20 0111 12/24/20 0424 12/25/20 0210 12/26/20 0220   AST 20 19 20 26  40  ALT 14 12 12 16 25   ALKPHOS 76 79 77 75 75  BILITOT 0.4 0.6 0.6 0.6 0.7  PROT 6.9 6.4* 6.2* 6.3* 6.2*  ALBUMIN 3.0* 2.6* 2.5* 2.6* 2.5*    CBG: Recent Labs  Lab 12/25/20 1618 12/25/20 2046 12/26/20 0027 12/26/20 0506 12/26/20 0845  GLUCAP 90 87 88 94 81     Recent Results (from the past 240 hour(s))  Blood culture (routine x 2)     Status: None (Preliminary result)   Collection Time: 12/21/20 10:29 AM   Specimen: BLOOD  Result Value Ref Range Status   Specimen Description BLOOD RIGHT ANTECUBITAL  Final   Special Requests   Final    BOTTLES DRAWN AEROBIC ONLY Blood Culture results may not be optimal due to an inadequate volume of blood received in culture bottles   Culture   Final    NO GROWTH 4 DAYS Performed at [facility], [address], [place], [place] [address]    Report Status PENDING  Incomplete  Blood culture (routine x 2)     Status: None (Preliminary result)   Collection Time: 12/21/20 10:34 AM   Specimen: BLOOD  Result Value Ref Range Status   Specimen Description BLOOD SITE NOT SPECIFIED  Final   Special Requests   Final    BOTTLES DRAWN AEROBIC ONLY Blood Culture results may not be optimal due to an inadequate volume of blood received in culture bottles   Culture   Final    NO GROWTH 4 DAYS Performed at [facility], [address] [place], [place] [address]    Report Status PENDING  Incomplete  Resp Panel by RT-PCR (Flu Emori Mumme&B, Covid) Urine, Clean Catch     Status: None   Collection Time: 12/21/20  8:34 PM   Specimen: Urine, Clean Catch; Nasopharyngeal(NP) swabs in vial transport medium  Result Value Ref Range Status   SARS Coronavirus 2 by RT PCR NEGATIVE NEGATIVE Final    Comment: (NOTE) SARS-CoV-2 target nucleic acids are NOT DETECTED.  The SARS-CoV-2 RNA is generally detectable in upper respiratory specimens during the acute phase of infection. The lowest concentration of SARS-CoV-2 viral copies this assay can  detect is 138 copies/mL. Tosca Pletz negative result does not preclude SARS-Cov-2 infection and should not be used as the sole basis for treatment or other patient management decisions. Cameo Schmiesing negative result may occur with  improper specimen collection/handling, submission of specimen other than nasopharyngeal swab, presence of viral mutation(s) within the areas targeted by this assay, and inadequate number of viral copies(<138 copies/mL). Taytum Scheck negative result must be combined with clinical observations, patient history, and epidemiological information. The expected result is Negative.  Fact Sheet for Patients:  BloggerCourse.com  Fact Sheet for Healthcare Providers:  SeriousBroker.it  This test is no t yet approved or cleared by the [place] FDA and  has been authorized for detection and/or diagnosis of SARS-CoV-2 by FDA under an Emergency Use Authorization (EUA). This EUA will remain  in effect (meaning this test can be used) for the duration of the COVID-19 declaration under Section 564(b)(1) of the Act, 21 U.S.C.section 360bbb-3(b)(1), unless the authorization is terminated  or revoked sooner.       Influenza Carel Carrier by PCR NEGATIVE NEGATIVE Final   Influenza B by PCR NEGATIVE NEGATIVE Final    Comment: (NOTE) The Xpert Xpress SARS-CoV-2/FLU/RSV plus assay is intended as an aid in the diagnosis of influenza from Nasopharyngeal swab specimens and should not be used as Danica Camarena sole basis for treatment. Nasal washings and aspirates are unacceptable for Xpert Xpress SARS-CoV-2/FLU/RSV testing.  Fact Sheet for Patients: BloggerCourse.com  Fact Sheet for Healthcare Providers: SeriousBroker.it  This test is not yet approved or cleared by the [place] FDA and has been authorized for detection and/or diagnosis of SARS-CoV-2 by FDA under an Emergency Use Authorization (EUA). This EUA will remain in  effect (meaning this test can be used) for the duration of the COVID-19 declaration under Section 564(b)(1) of the Act, 21 U.S.C. section 360bbb-3(b)(1), unless the authorization is terminated or revoked.  Performed at [facility], [address], [place],  [place] [address]   MRSA Next Gen by PCR, Nasal     Status: None   Collection Time: 12/22/20  1:00 AM   Specimen: Nasal Mucosa; Nasal Swab  Result Value Ref Range Status   MRSA by PCR Next Gen NOT DETECTED NOT DETECTED Final    Comment: (NOTE) The GeneXpert MRSA Assay (FDA approved for NASAL specimens only), is one component of Daton Szilagyi comprehensive MRSA colonization surveillance program. It is not intended to diagnose MRSA infection nor to guide or monitor treatment for MRSA infections. Test performance is not FDA approved in patients less than 40 years old. Performed at [facility], [address] [place], [place] [address]          Radiology Studies: PERIPHERAL VASCULAR CATHETERIZATION  Result Date: 12/24/2020 DATE OF SERVICE: 12/24/2020  PATIENT:  Kristeen Miss  87 y.o. 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 Lenell Antu, MD  ASSISTANT: none  ANESTHESIA:   local  ESTIMATED BLOOD LOSS: min  LOCAL MEDICATIONS USED:  LIDOCAINE  COUNTS: confirmed correct.  PATIENT DISPOSITION:  PACU - hemodynamically stable.  Delay start of Pharmacological VTE agent (>24hrs) due to surgical blood loss or risk of bleeding: no  INDICATION FOR PROCEDURE: ALESA HERTLING is Ever Gustafson 86 y.o. female with bilateral lower extremity ulceration and osteomyelitis.  She underwent angiography with me in July which treated Ziomara Birenbaum left popliteal lesion.  She had much improved flow to her  foot.  She has seen some healing of Nelsy Madonna left foot ulcer, but has underlying osteomyelitis still.  Recent MRI shows that she also has right-sided osteomyelitis with ulceration.  As I did not evaluate her right lower extremity during angiography in July, I offered her repeat angiography.  The patient was amenable and wished to proceed.  OPERATIVE FINDINGS:  Right lower extremity: Common femoral artery: widely patent without flow limiting stenosis Profunda femoris artery:  widely patent without flow limiting stenosis Superficial femoral artery: Diffusely diseased, approximately 50% calcified stenosis Popliteal artery: diffusely diseased Anterior tibial artery: occluded Tibioperoneal trunk: widely patent without flow limiting stenosis Peroneal artery: widely patent without flow limiting stenosis Posterior tibial artery: occluded Pedal circulation: fills via robust collateralization from peroneal  DESCRIPTION OF PROCEDURE: After identification of the patient in the pre-operative holding area, the patient was transferred to the operating room. The patient was positioned supine on the operating room table. Anesthesia was induced. The groins was prepped and draped in standard fashion. Heru Montz surgical pause was performed confirming correct patient, procedure, and operative location.  The left groin was anesthetized with subcutaneous injection of 1% lidocaine. Using ultrasound guidance, the left common femoral artery was accessed with micropuncture technique. Fluoroscopy was used to confirm cannulation over the femoral head. The 8F sheath was upsized to 50F.  Yenty Bloch Benson wire was advanced into the distal aorta. Over the wire an omni flush catheter was advanced to the level of L2. Aortogram was performed - see above for details.  The right common iliac artery was selected with Sundeep Cary Bentson guidewire. The wire was advanced into the common femoral artery. Over the wire the omni flush catheter was advanced into the external iliac artery.  Selective angiography was performed - see above for details.  The sheath was left in  place with plans to remove the sheath in PACU.  Upon completion of the case instrument and sharps counts were confirmed correct. The patient was transferred to the PACU in good condition. I was present for all portions of the procedure.  PLAN: Patient is optimized in both lower extremities from vascular surgery standpoint.  Continue aspirin, Plavix, statin therapy indefinitely.  Safe to proceed with foot debridement as necessary per podiatry team.  Rande Brunt. Lenell Antu, MD [facility] Phone Number: [phone] 12/24/2020 4:36 PM        Scheduled Meds:  amLODipine  5 mg Oral Daily   artificial tears  1 application Both Eyes QHS   aspirin EC  81 mg Oral Daily   atorvastatin  10 mg Oral Daily   clopidogrel  75 mg Oral Daily   cycloSPORINE  1 drop Both Eyes BID   DULoxetine  30 mg Oral Daily   ferrous sulfate  325 mg Oral Daily   fluticasone  2 spray Each Nare Daily   free water  180 mL Per Tube TID   gabapentin  300 mg Oral BID   heparin  5,000 Units Subcutaneous Q8H   hydrALAZINE  50 mg Oral TID   isosorbide mononitrate  60 mg Oral Daily   latanoprost  1 drop Both Eyes QHS   levothyroxine  125 mcg Oral QAC breakfast   loratadine  10 mg Oral Daily   mometasone-formoterol  2 puff Inhalation BID   multivitamin with minerals  1 tablet Oral Daily   nystatin cream  1 application Topical BID   pantoprazole  40 mg Oral BID   povidone-iodine   Topical Q1400   senna-docusate  1 tablet Oral QHS   sodium chloride flush  3 mL Intravenous Q12H   Continuous Infusions:  sodium chloride     feeding supplement (OSMOLITE 1.5 CAL) 65 mL/hr at 12/24/20 2054     LOS: 5 days    Time spent: over 30 min    Lacretia Nicks, MD [facility]   To contact the attending provider between 7A-7P or the covering provider during after hours 7P-7A, please log into the web site  [email or web address] and access using universal [place] password for that web site. If you do not have the password, please call the hospital operator.  12/26/2020, 10:40 AM

## 2020-12-26 NOTE — Brief Op Note (Signed)
12/26/2020  8:37 AM  PATIENT:  Shannon [name]  87 y.o. female  PRE-OPERATIVE DIAGNOSIS:  BILATERAL FOOT ULCERS  POST-OPERATIVE DIAGNOSIS:  BILATERAL FOOT ULCERS  PROCEDURE:  Procedure(s): IRRIGATION AND DEBRIDEMENT FOOT AND BONE BIOPSY (Bilateral)  SURGEON:  Surgeon(s) and Role:    * Trula Slade, DPM - Primary  PHYSICIAN ASSISTANT:   ASSISTANTS: none   ANESTHESIA:   general  EBL:  75 mL   BLOOD ADMINISTERED:none  DRAINS: none   LOCAL MEDICATIONS USED:  OTHER 10 cc lidocaine and marcaine plain  SPECIMEN:  Source of Specimen:  left 1st, right 3rd and right 5th metatarsal  DISPOSITION OF SPECIMEN:  PATHOLOGY  COUNTS:  YES  TOURNIQUET:  * No tourniquets in log *  DICTATION: .Dragon Dictation  PLAN OF CARE: Admit to inpatient   PATIENT DISPOSITION:  PACU - hemodynamically stable.   Delay start of Pharmacological VTE agent (>24hrs) due to surgical blood loss or risk of bleeding: no  Intraoperative findings: Debrided ulcers x 3 and obtained bone culture. The left 1st and right 5th bone appears solid but the right 3rd was soft and indicative of osteomyelitis. Await cultures for antibiotic needs upon discharge.

## 2020-12-27 ENCOUNTER — Encounter (HOSPITAL_COMMUNITY): Payer: Self-pay | Admitting: Podiatry

## 2020-12-27 DIAGNOSIS — M86172 Other acute osteomyelitis, left ankle and foot: Secondary | ICD-10-CM | POA: Diagnosis not present

## 2020-12-27 LAB — CBC WITH DIFFERENTIAL/PLATELET
Abs Immature Granulocytes: 0.01 10*3/uL (ref 0.00–0.07)
Basophils Absolute: 0 10*3/uL (ref 0.0–0.1)
Basophils Relative: 0 %
Eosinophils Absolute: 0 10*3/uL (ref 0.0–0.5)
Eosinophils Relative: 0 %
HCT: 25.3 % — ABNORMAL LOW (ref 36.0–46.0)
Hemoglobin: 8.6 g/dL — ABNORMAL LOW (ref 12.0–15.0)
Immature Granulocytes: 0 %
Lymphocytes Relative: 13 %
Lymphs Abs: 0.6 10*3/uL — ABNORMAL LOW (ref 0.7–4.0)
MCH: 32.5 pg (ref 26.0–34.0)
MCHC: 34 g/dL (ref 30.0–36.0)
MCV: 95.5 fL (ref 80.0–100.0)
Monocytes Absolute: 0.8 10*3/uL (ref 0.1–1.0)
Monocytes Relative: 18 %
Neutro Abs: 2.9 10*3/uL (ref 1.7–7.7)
Neutrophils Relative %: 69 %
Platelets: 179 10*3/uL (ref 150–400)
RBC: 2.65 MIL/uL — ABNORMAL LOW (ref 3.87–5.11)
RDW: 17.1 % — ABNORMAL HIGH (ref 11.5–15.5)
WBC: 4.3 10*3/uL (ref 4.0–10.5)
nRBC: 0 % (ref 0.0–0.2)

## 2020-12-27 LAB — MAGNESIUM: Magnesium: 1.7 mg/dL (ref 1.7–2.4)

## 2020-12-27 LAB — PHOSPHORUS: Phosphorus: 3.5 mg/dL (ref 2.5–4.6)

## 2020-12-27 LAB — COMPREHENSIVE METABOLIC PANEL
ALT: 38 U/L (ref 0–44)
AST: 57 U/L — ABNORMAL HIGH (ref 15–41)
Albumin: 2.4 g/dL — ABNORMAL LOW (ref 3.5–5.0)
Alkaline Phosphatase: 78 U/L (ref 38–126)
Anion gap: 8 (ref 5–15)
BUN: 39 mg/dL — ABNORMAL HIGH (ref 8–23)
CO2: 21 mmol/L — ABNORMAL LOW (ref 22–32)
Calcium: 8.6 mg/dL — ABNORMAL LOW (ref 8.9–10.3)
Chloride: 100 mmol/L (ref 98–111)
Creatinine, Ser: 1.21 mg/dL — ABNORMAL HIGH (ref 0.44–1.00)
GFR, Estimated: 41 mL/min — ABNORMAL LOW (ref >60)
Glucose, Bld: 191 mg/dL — ABNORMAL HIGH (ref 70–99)
Potassium: 4.3 mmol/L (ref 3.5–5.1)
Sodium: 129 mmol/L — ABNORMAL LOW (ref 135–145)
Total Bilirubin: 0.6 mg/dL (ref 0.3–1.2)
Total Protein: 5.9 g/dL — ABNORMAL LOW (ref 6.5–8.1)

## 2020-12-27 LAB — GLUCOSE, CAPILLARY
Glucose-Capillary: 155 mg/dL — ABNORMAL HIGH (ref 70–99)
Glucose-Capillary: 184 mg/dL — ABNORMAL HIGH (ref 70–99)
Glucose-Capillary: 191 mg/dL — ABNORMAL HIGH (ref 70–99)
Glucose-Capillary: 67 mg/dL — ABNORMAL LOW (ref 70–99)
Glucose-Capillary: 80 mg/dL (ref 70–99)

## 2020-12-27 MED ORDER — INSULIN ASPART 100 UNIT/ML IJ SOLN
0.0000 [IU] | Freq: Three times a day (TID) | INTRAMUSCULAR | Status: DC
  Administered 2020-12-27: 2 [IU] via SUBCUTANEOUS
  Administered 2020-12-28: 1 [IU] via SUBCUTANEOUS
  Administered 2020-12-29 – 2020-12-30 (×2): 2 [IU] via SUBCUTANEOUS
  Administered 2020-12-31: 1 [IU] via SUBCUTANEOUS

## 2020-12-27 NOTE — Progress Notes (Signed)
Subjective:  Patient ID: Shannon [name], female    DOB: 1923-12-21,  MRN: [id number]  Doing well resting comfortably at bedside says she is has some soreness in her feet but not significant amounts of pain denies fevers and chills Negative for chest pain and shortness of breath Objective:   Vitals:   12/27/20 1521 12/27/20 1817  BP: (!) 162/68 120/64  Pulse: 72   Resp: 18   Temp: 98 F (36.7 C)   SpO2: 100%    General AA&O x3. Normal mood and affect.  Vascular Weak pulses toes warm to touch  Neurologic Epicritic sensation grossly reduced.  Dermatologic Dressing clean dry and intact wound stable with granular wound beds  Orthopedic: MMT 5/5    Assessment & Plan:  Patient was evaluated and treated and all questions answered.  Bilateral foot ulcerations concerning for osteomyelitis -So far no growth on wound cultures we will continue to follow progress.  Would favor having final results prior to discharge -Infectious diseases following and assistance appreciated in managing her antibiotics -Every other day dressing changes with silver alginate dressings and dry sterile gauze.  Continue this through discharge -We will continue to follow, will try to see her tomorrow during lunch when her daughter may be available and present at bedside  Edwin Cap, DPM  Accessible via secure chat for questions or concerns.

## 2020-12-27 NOTE — Progress Notes (Signed)
Occupational Therapy Treatment Patient Details Name: Shannon [name] MRN: [id number] DOB: 1923/11/07 Today's Date: 12/27/2020   History of present illness Shannon [name] is a 87 y.o. female with medical history significant of DM2, HTN, CKD 4, gastric stenosis s/p feeding tube.  She presented to the hospital after an episode of unresponsiveness.  She was brought to hospital by EMS.  She was complaining of bilateral foot pain.  Underwent I&D and bone biopsy of feet on 9/25 due to osteomyelitis L foot.   OT comments  PTA, pt lives with daughter and reports Modified Independence with ADLs and mobility using Rollator. Daughter assists with shower transfers and IADL tasks. Pt presents now with expected post op pain, as well as balance and endurance deficits. Pt able to demo mobility using RW at min guard but requires Min A to boost up from surfaces (improved with BSC over toilet). Pt requires Setup for UB ADLs and up to Mod A for LB ADLs, including toileting task during session. Pt requires assist for B post op shoe mgmt but reports her daughter can assist as needed at home. Recommend HHOT follow-up at DC with initial hands on assist for ADLs/mobility.    Recommendations for follow up therapy are one component of a multi-disciplinary discharge planning process, led by the attending physician.  Recommendations may be updated based on patient status, additional functional criteria and insurance authorization.    Follow Up Recommendations  Home health OT    Equipment Recommendations  3 in 1 bedside commode    Recommendations for Other Services      Precautions / Restrictions Precautions Precautions: Fall Required Braces or Orthoses: Other Brace Other Brace: bilateral post op shoes Restrictions Weight Bearing Restrictions: No       Mobility Bed Mobility Overal bed mobility: Needs Assistance Bed Mobility: Supine to Sit     Supine to sit: Min assist;HOB elevated     General bed mobility  comments: received in chair    Transfers Overall transfer level: Needs assistance Equipment used: Rolling walker (2 wheeled) Transfers: Sit to/from Stand Sit to Stand: Min assist         General transfer comment: Min A to power up from recliner using armrest    Balance Overall balance assessment: Needs assistance   Sitting balance-Leahy Scale: Good     Standing balance support: Bilateral upper extremity supported Standing balance-Leahy Scale: Poor Standing balance comment: UE support, unsteady on feet with post op shoes                           ADL either performed or assessed with clinical judgement   ADL Overall ADL's : Needs assistance/impaired Eating/Feeding: Independent;Sitting   Grooming: Min guard;Standing   Upper Body Bathing: Set up;Sitting   Lower Body Bathing: Sit to/from stand;Moderate assistance   Upper Body Dressing : Set up;Sitting   Lower Body Dressing: Moderate assistance;Sit to/from stand;Sitting/lateral leans Lower Body Dressing Details (indicate cue type and reason): unable to reach B feet to don post op shoes, assistance needed Toilet Transfer: Min guard;Ambulation;RW;BSC;Regular Toilet;Grab bars Toilet Transfer Details (indicate cue type and reason): BSC over toilet, use of grab bars to stand and RW Toileting- Clothing Manipulation and Hygiene: Moderate assistance;Sitting/lateral lean;Sit to/from stand Toileting - Clothing Manipulation Details (indicate cue type and reason): Able to assist in peri care though LOB noted. Assist for thoroughness and clothing mgmt needed       General ADL Comments: Pt with minor  balance deficits in standing, difficulty reaching B feet for tasks. Pt reports her daughter can assist as needed at home`     Vision Baseline Vision/History: 1 Wears glasses Ability to See in Adequate Light: 0 Adequate Patient Visual Report: No change from baseline Vision Assessment?: No apparent visual deficits    Perception     Praxis      Cognition Arousal/Alertness: Awake/alert Behavior During Therapy: WFL for tasks assessed/performed Overall Cognitive Status: History of cognitive impairments - at baseline                                 General Comments: slow processing along with [facility]        Exercises     Shoulder Instructions       General Comments VSS on RA    Pertinent Vitals/ Pain       Pain Assessment: Faces Pain Score: 6  Faces Pain Scale: Hurts a little bit Pain Location: B feet Pain Descriptors / Indicators: Aching;Sore Pain Intervention(s): Monitored during session  Home Living Family/patient expects to be discharged to:: Private residence Living Arrangements: Children Available Help at Discharge: Available 24 hours/day Type of Home: House Home Access: Ramped entrance     Home Layout: One level     Bathroom Shower/Tub: Chief Strategy Officer: Standard Bathroom Accessibility: Yes   Home Equipment: Environmental consultant - 4 wheels;Bedside commode;Tub bench;Hand held shower head;Wheelchair - manual          Prior Functioning/Environment Level of Independence: Needs assistance  Gait / Transfers Assistance Needed: rollator for short ambulation distances  ADL's / Homemaking Assistance Needed: pt reports independence with ADLs, daughter assists with medication managmenet, shower transfers, and cooking       Frequency  Min 2X/week        Progress Toward Goals  OT Goals(current goals can now be found in the care plan section)     Acute Rehab OT Goals Patient Stated Goal: to heal feet OT Goal Formulation: With patient Time For Goal Achievement: 01/10/21 Potential to Achieve Goals: Good  Plan      Co-evaluation                 AM-PAC OT "6 Clicks" Daily Activity     Outcome Measure   Help from another person eating meals?: None Help from another person taking care of personal grooming?: A Little Help from another person  toileting, which includes using toliet, bedpan, or urinal?: A Lot Help from another person bathing (including washing, rinsing, drying)?: A Lot Help from another person to put on and taking off regular upper body clothing?: A Little Help from another person to put on and taking off regular lower body clothing?: A Lot 6 Click Score: 16    End of Session Equipment Utilized During Treatment: Rolling walker;Gait belt  OT Visit Diagnosis: Unsteadiness on feet (R26.81);Other abnormalities of gait and mobility (R26.89);Muscle weakness (generalized) (M62.81);Pain Pain - part of body: Ankle and joints of foot   Activity Tolerance Patient tolerated treatment well   Patient Left in chair;with call bell/phone within reach   Nurse Communication Mobility status        Time: 3244-0102 OT Time Calculation (min): 33 min  Charges: OT General Charges $OT Visit: 1 Visit OT Evaluation $OT Eval Moderate Complexity: 1 Mod OT Treatments $Self Care/Home Management : 8-22 mins  Bradd Canary, OTR/L Acute Rehab Services Office: [phone]   Raynelle Fanning  Helon Wisinski 12/27/2020, 2:30 PM

## 2020-12-27 NOTE — Progress Notes (Signed)
Pt BS was 80 @2031 . Gave pt Boost and started feeding. Will recheck at 0000. Notified provider.

## 2020-12-27 NOTE — Progress Notes (Signed)
[facility]   Reason for visit: Follow up on osteomyelitis  Interval History: no complaints, s/p debridement by Dr. Jacqualyn Posey yesterday.   Vancomycin + cefepime started  Culture ngtd  Physical Exam: Constitutional:  Vitals:   12/27/20 1112 12/27/20 1140  BP: (!) 167/59 (!) 167/59  Pulse: 70   Resp: 20   Temp: 97.7 F (36.5 C)   SpO2: 100%    patient appears in NAD Respiratory: Normal respiratory effort; CTA B Cardiovascular: RRR GI: soft, nt, nd  Review of Systems: Constitutional: negative for fevers and chills Gastrointestinal: negative for nausea and diarrhea Integument/breast: negative for rash  Lab Results  Component Value Date   WBC 4.3 12/27/2020   HGB 8.6 (L) 12/27/2020   HCT 25.3 (L) 12/27/2020   MCV 95.5 12/27/2020   PLT 179 12/27/2020    Lab Results  Component Value Date   CREATININE 1.21 (H) 12/27/2020   BUN 39 (H) 12/27/2020   NA 129 (L) 12/27/2020   K 4.3 12/27/2020   CL 100 12/27/2020   CO2 21 (L) 12/27/2020    Lab Results  Component Value Date   ALT 38 12/27/2020   AST 57 (H) 12/27/2020   ALKPHOS 78 12/27/2020     Microbiology: Recent Results (from the past 240 hour(s))  Blood culture (routine x 2)     Status: None   Collection Time: 12/21/20 10:29 AM   Specimen: BLOOD  Result Value Ref Range Status   Specimen Description BLOOD RIGHT ANTECUBITAL  Final   Special Requests   Final    BOTTLES DRAWN AEROBIC ONLY Blood Culture results may not be optimal due to an inadequate volume of blood received in culture bottles   Culture   Final    NO GROWTH 5 DAYS Performed at [facility], [place] [address], [place], [place] [address]    Report Status 12/26/2020 FINAL  Final  Blood culture (routine x 2)     Status: None   Collection Time: 12/21/20 10:34 AM   Specimen: BLOOD  Result Value Ref Range Status   Specimen Description BLOOD SITE NOT SPECIFIED  Final   Special Requests   Final    BOTTLES DRAWN AEROBIC  ONLY Blood Culture results may not be optimal due to an inadequate volume of blood received in culture bottles   Culture   Final    NO GROWTH 5 DAYS Performed at [facility], [place] [address], [place], [place] [address]    Report Status 12/26/2020 FINAL  Final  Resp Panel by RT-PCR (Flu A&B, Covid) Urine, Clean Catch     Status: None   Collection Time: 12/21/20  8:34 PM   Specimen: Urine, Clean Catch; Nasopharyngeal(NP) swabs in vial transport medium  Result Value Ref Range Status   SARS Coronavirus 2 by RT PCR NEGATIVE NEGATIVE Final    Comment: (NOTE) SARS-CoV-2 target nucleic acids are NOT DETECTED.  The SARS-CoV-2 RNA is generally detectable in upper respiratory specimens during the acute phase of infection. The lowest concentration of SARS-CoV-2 viral copies this assay can detect is 138 copies/mL. A negative result does not preclude SARS-Cov-2 infection and should not be used as the sole basis for treatment or other patient management decisions. A negative result may occur with  improper specimen collection/handling, submission of specimen other than nasopharyngeal swab, presence of viral mutation(s) within the areas targeted by this assay, and inadequate number of viral copies(<138 copies/mL). A negative result must be combined with  clinical observations, patient history, and epidemiological information. The expected result is Negative.  Fact Sheet for Patients:  EntrepreneurPulse.com.au  Fact Sheet for Healthcare Providers:  IncredibleEmployment.be  This test is no t yet approved or cleared by the [place] FDA and  has been authorized for detection and/or diagnosis of SARS-CoV-2 by FDA under an Emergency Use Authorization (EUA). This EUA will remain  in effect (meaning this test can be used) for the duration of the COVID-19 declaration under Section 564(b)(1) of the Act, 21 U.S.C.section 360bbb-3(b)(1), unless the authorization  is terminated  or revoked sooner.       Influenza A by PCR NEGATIVE NEGATIVE Final   Influenza B by PCR NEGATIVE NEGATIVE Final    Comment: (NOTE) The Xpert Xpress SARS-CoV-2/FLU/RSV plus assay is intended as an aid in the diagnosis of influenza from Nasopharyngeal swab specimens and should not be used as a sole basis for treatment. Nasal washings and aspirates are unacceptable for Xpert Xpress SARS-CoV-2/FLU/RSV testing.  Fact Sheet for Patients: EntrepreneurPulse.com.au  Fact Sheet for Healthcare Providers: IncredibleEmployment.be  This test is not yet approved or cleared by the [place] FDA and has been authorized for detection and/or diagnosis of SARS-CoV-2 by FDA under an Emergency Use Authorization (EUA). This EUA will remain in effect (meaning this test can be used) for the duration of the COVID-19 declaration under Section 564(b)(1) of the Act, 21 U.S.C. section 360bbb-3(b)(1), unless the authorization is terminated or revoked.  Performed at [facility], [place] [address], [place], [place] [address]   MRSA Next Gen by PCR, Nasal     Status: None   Collection Time: 12/22/20  1:00 AM   Specimen: Nasal Mucosa; Nasal Swab  Result Value Ref Range Status   MRSA by PCR Next Gen NOT DETECTED NOT DETECTED Final    Comment: (NOTE) The GeneXpert MRSA Assay (FDA approved for NASAL specimens only), is one component of a comprehensive MRSA colonization surveillance program. It is not intended to diagnose MRSA infection nor to guide or monitor treatment for MRSA infections. Test performance is not FDA approved in patients less than 11 years old. Performed at [facility], [place] [address] [place], [place] [address]   Aerobic/Anaerobic Culture w Gram Stain (surgical/deep wound)     Status: None (Preliminary result)   Collection Time: 12/26/20  8:11 AM   Specimen: PATH Bone biopsy; Tissue  Result Value Ref Range Status    Specimen Description BONE  Final   Special Requests LEFT 1ST METATARSAL BONE FOR CULTURE SPEC A  Final   Gram Stain   Final    NO WBC SEEN NO ORGANISMS SEEN Performed at [facility], [address], [place], [place] [address]    Culture PENDING  Incomplete   Report Status PENDING  Incomplete  Aerobic/Anaerobic Culture w Gram Stain (surgical/deep wound)     Status: None (Preliminary result)   Collection Time: 12/26/20  8:14 AM   Specimen: PATH Bone biopsy; Tissue  Result Value Ref Range Status   Specimen Description TISSUE  Final   Special Requests RIGHT 3RD METATARSAL BONE FOR CULTURE SPEC B  Final   Gram Stain   Final    NO WBC SEEN NO ORGANISMS SEEN Performed at [facility], [address], [place], [place] [address]    Culture PENDING  Incomplete   Report Status PENDING  Incomplete  Aerobic/Anaerobic Culture w Gram Stain (surgical/deep wound)     Status: None (Preliminary result)  Collection Time: 12/26/20  8:15 AM   Specimen: PATH Bone biopsy; Tissue  Result Value Ref Range Status   Specimen Description TISSUE  Final   Special Requests RIGHT 5TH METATARSAL BONE FOR CULTURE SPEC C  Final   Gram Stain   Final    NO WBC SEEN NO ORGANISMS SEEN Performed at [facility], [address] [place], [place] [address]    Culture PENDING  Incomplete   Report Status PENDING  Incomplete    Impression/Plan:  1. Possible osteomyelitis - she is now s/p debridement of both the left and right foot and Dr. Wagoner noted soft, infected looking bone grossly from the right, third toe, though others looked healthy.   Previous cultures include a positive Pseudomonas from a swab of the left first MTP joint on 09/22/20 and recent cultures on 12/13/20 from the right foot previous amputation site and grew out Enterococcus, amp and vancomycin sensitive.   At this point, will continue to monitor the bone cultures sent yesterday and with the history of the Enterococcus on the right,  will treat that with Augmentin for 4 weeks and can reassess.   CRP 1.7 and ESR 56 at baseline.   2.  Vascular - angiography performed and now optimized for treatment as above.    3.  Penicillin allergy - treatment with multiple penicillins in the past including recent amoxicillin/clavulanate.  I will delist this as an allergy.    Will monitor culture and if she is discharged tomorrow, I will watch the culture after discharge and consider any changes to her treatment as indicated.     

## 2020-12-27 NOTE — Progress Notes (Signed)
PROGRESS NOTE    Shannon [name]  ZOX:[id number] DOB: 04/10/1923 DOA: 12/21/2020 PCP: Sharon Seller, NP   No chief complaint on file.   Brief Narrative:  Shannon [name] is Shannon [name] 87 y.o. female with medical history significant of DM2, HTN, CKD 4, gastric stenosis s/p feeding tube.  She presented to the hospital after an episode of unresponsiveness.  She was brought to hospital by EMS.  She was complaining of bilateral foot pain.    She's being seen for bilateral lower extremity osteo.   Assessment & Plan:   Principal Problem:   Osteomyelitis of left foot (HCC) Active Problems:   Type 2 diabetes mellitus with chronic kidney disease (HCC)   Accelerated hypertension   Acute renal failure superimposed on stage 4 chronic kidney disease (HCC)   Chronic diastolic CHF (congestive heart failure) (HCC)   Osteomyelitis of right foot (HCC)   Diabetic infection of right foot (HCC)   Diabetic foot infection (HCC)  Bilateral Diabetic Foot Infections  Osteomyelitis MRI R foot notable for acute osteo of 3rd metatarsal, bone marrow edema at resection margin of 5th metatarsal, diffuse soft tissue swelling at the distal stump as well as the dorsum of the forefoot.   MRI L foot notable for dislocation and hallux valgus at the first MTP joint with boy erosion particularaly in the head of the first metatarsal medially, but also in the base of the proximal phalanx, with marrow edema in both of the structures - concerning for osteo and possible septic joint.  Transverse fx of head of 3rd metatarsa, ? Superimposed osteo.   R foot ulcer with enterococcus 8 days ago L foot ulcer with pseudomonas 3 months ago Infectious disease consult, appreciate recs - recommending holding abx for now, recommending vascular eval regarding risk of debridement/healing.  Will start broad spectrum abx, vanc/cefepime. Vascular c/s - s/p RLE angiogram 9/23 - per vascular, pt optimized in both lower extremities from vascular surgery  standpoint.  ASA, plavix, statin indefinitely.  Podiatry c/s, appreciate recs - s/p irrigation and debridement, foot and bone biopsy (bilateral) 9/25 -> follow pending cultures  PAD S/p L popliteal angioplasty 10/20/20 S/p RLE angiogram 9/23 recommending aspirin indefinitely, plavix indefinitely.  Statin indefinitely.  Vascular c/s, appreciate assistance with PAD and above -> safe to proceed with debridement of foot per podiatry.  AKI on CKD IIIB Baseline creatinine appears to be around 1.45 1.68 at presentation Better than recent baseline  HTN Continue home BP meds  T2DM A1c 5.5 last month Diet controlled Continue BG check  HFpEF Holding lasix given aki on presentation  Gastric stenosis s/p feeding tube Continue tube feeds   DVT prophylaxis: heparin Code Status: DNR Family Communication: none at bedside - daughter over phone 9/21, 9/22 Disposition:   Status is: Inpatient  Remains inpatient appropriate because:Inpatient level of care appropriate due to severity of illness  Dispo: The patient is from: Home              Anticipated d/c is to:  pending              Patient currently is not medically stable to d/c.   Difficult to place patient No       Consultants:  ID podiatry  Procedures:  PROCEDURE:   1) US guided left common femoral artery access 2) Right lower extremity angiogram with second order cannulation (35mL total contrast)  Irrigation and debridement, foot and bone biopsy  Antimicrobials:  Anti-infectives (From admission, onward)  Start     Dose/Rate Route Frequency Ordered Stop   12/28/20 1900  vancomycin (VANCOCIN) IVPB 1000 mg/200 mL premix        1,000 mg 200 mL/hr over 60 Minutes Intravenous Every 48 hours 12/26/20 1203     12/26/20 1230  ceFEPIme (MAXIPIME) 2 g in sodium chloride 0.9 % 100 mL IVPB        2 g 200 mL/hr over 30 Minutes Intravenous Every 24 hours 12/26/20 1142     12/26/20 1230  vancomycin (VANCOREADY) IVPB 1250 mg/250  mL        1,250 mg 166.7 mL/hr over 90 Minutes Intravenous  Once 12/26/20 1142 12/26/20 2209   12/21/20 2200  piperacillin-tazobactam (ZOSYN) IVPB 3.375 g  Status:  Discontinued        3.375 g 12.5 mL/hr over 240 Minutes Intravenous Every 12 hours 12/21/20 2105 12/22/20 1157   12/21/20 2030  vancomycin (VANCOREADY) IVPB 1250 mg/250 mL  Status:  Discontinued        1,250 mg 166.7 mL/hr over 90 Minutes Intravenous  Once 12/21/20 2022 12/21/20 2035          Subjective: No complaints  Objective: Vitals:   12/26/20 2224 12/27/20 0419 12/27/20 0805 12/27/20 0904  BP: (!) 140/52 (!) 135/55 (!) 143/58   Pulse:  82 79   Resp:  (!) 25 18   Temp:  98.1 F (36.7 C) 97.7 F (36.5 C)   TempSrc:  Oral Oral   SpO2:  100%  100%  Weight:  58.9 kg    Height:        Intake/Output Summary (Last 24 hours) at 12/27/2020 1014 Last data filed at 12/27/2020 0740 Gross per 24 hour  Intake 715 ml  Output 20 ml  Net 695 ml   Filed Weights   12/25/20 0431 12/26/20 0736 12/27/20 0419  Weight: 57.5 kg 57.2 kg 58.9 kg    Examination:  General: No acute distress. Cardiovascular: RRR Lungs: unlabored Abdomen: Soft, nontender, nondistended  Neurological: Alert and oriented 3. Moves all extremities 4 . Cranial nerves II through XII grossly intact. Extremities: dressings to bilateral LEs      Data Reviewed: I have personally reviewed following labs and imaging studies  CBC: Recent Labs  Lab 12/23/20 0111 12/24/20 0424 12/25/20 0210 12/26/20 0220 12/27/20 0103  WBC 4.1 3.4* 4.5 4.6 4.3  NEUTROABS 2.2 1.7 3.0 2.8 2.9  HGB 8.8* 9.1* 8.6* 8.9* 8.6*  HCT 26.2* 26.9* 26.2* 26.1* 25.3*  MCV 95.6 96.8 97.0 94.6 95.5  PLT 189 189 190 175 179    Basic Metabolic Panel: Recent Labs  Lab 12/23/20 0111 12/24/20 0424 12/25/20 0210 12/26/20 0220 12/27/20 0103  NA 137 136 133* 131* 129*  K 4.1 4.1 4.1 4.7 4.3  CL 108 108 106 102 100  CO2 22 23 21* 23 21*  GLUCOSE 175* 168* 154* 87  191*  BUN 47* 31* 27* 26* 39*  CREATININE 1.31* 1.05* 1.00 1.05* 1.21*  CALCIUM 9.0 8.8* 8.9 9.1 8.6*  MG 1.9 1.8 1.8 1.8 1.7  PHOS 2.7 3.1 2.6 3.6 3.5    GFR: Estimated Creatinine Clearance: 25.9 mL/min (Baylee Mccorkel) (by C-G formula based on SCr of 1.21 mg/dL (H)).  Liver Function Tests: Recent Labs  Lab 12/23/20 0111 12/24/20 0424 12/25/20 0210 12/26/20 0220 12/27/20 0103  AST 19 20 26  40 57*  ALT 12 12 16 25  38  ALKPHOS 79 77 75 75 78  BILITOT 0.6 0.6 0.6 0.7 0.6  PROT 6.4* 6.2*  6.3* 6.2* 5.9*  ALBUMIN 2.6* 2.5* 2.6* 2.5* 2.4*    CBG: Recent Labs  Lab 12/26/20 1557 12/26/20 2014 12/27/20 0011 12/27/20 0419 12/27/20 0815  GLUCAP 157* 153* 184* 191* 155*     Recent Results (from the past 240 hour(s))  Blood culture (routine x 2)     Status: None   Collection Time: 12/21/20 10:29 AM   Specimen: BLOOD  Result Value Ref Range Status   Specimen Description BLOOD RIGHT ANTECUBITAL  Final   Special Requests   Final    BOTTLES DRAWN AEROBIC ONLY Blood Culture results may not be optimal due to an inadequate volume of blood received in culture bottles   Culture   Final    NO GROWTH 5 DAYS Performed at [facility], [address], [place], [place] [address]    Report Status 12/26/2020 FINAL  Final  Blood culture (routine x 2)     Status: None   Collection Time: 12/21/20 10:34 AM   Specimen: BLOOD  Result Value Ref Range Status   Specimen Description BLOOD SITE NOT SPECIFIED  Final   Special Requests   Final    BOTTLES DRAWN AEROBIC ONLY Blood Culture results may not be optimal due to an inadequate volume of blood received in culture bottles   Culture   Final    NO GROWTH 5 DAYS Performed at [facility], [address], [place], [place] [address]    Report Status 12/26/2020 FINAL  Final  Resp Panel by RT-PCR (Flu Enrique Manganaro&B, Covid) Urine, Clean Catch     Status: None   Collection Time: 12/21/20  8:34 PM   Specimen: Urine, Clean Catch; Nasopharyngeal(NP) swabs in  vial transport medium  Result Value Ref Range Status   SARS Coronavirus 2 by RT PCR NEGATIVE NEGATIVE Final    Comment: (NOTE) SARS-CoV-2 target nucleic acids are NOT DETECTED.  The SARS-CoV-2 RNA is generally detectable in upper respiratory specimens during the acute phase of infection. The lowest concentration of SARS-CoV-2 viral copies this assay can detect is 138 copies/mL. Hortence Charter negative result does not preclude SARS-Cov-2 infection and should not be used as the sole basis for treatment or other patient management decisions. Zerrick Hanssen negative result may occur with  improper specimen collection/handling, submission of specimen other than nasopharyngeal swab, presence of viral mutation(s) within the areas targeted by this assay, and inadequate number of viral copies(<138 copies/mL). Bryttney Netzer negative result must be combined with clinical observations, patient history, and epidemiological information. The expected result is Negative.  Fact Sheet for Patients:  BloggerCourse.com  Fact Sheet for Healthcare Providers:  SeriousBroker.it  This test is no t yet approved or cleared by the [place] FDA and  has been authorized for detection and/or diagnosis of SARS-CoV-2 by FDA under an Emergency Use Authorization (EUA). This EUA will remain  in effect (meaning this test can be used) for the duration of the COVID-19 declaration under Section 564(b)(1) of the Act, 21 U.S.C.section 360bbb-3(b)(1), unless the authorization is terminated  or revoked sooner.       Influenza Vivienne Sangiovanni by PCR NEGATIVE NEGATIVE Final   Influenza B by PCR NEGATIVE NEGATIVE Final    Comment: (NOTE) The Xpert Xpress SARS-CoV-2/FLU/RSV plus assay is intended as an aid in the diagnosis of influenza from Nasopharyngeal swab specimens and should not be used as Elnathan Fulford sole basis for treatment. Nasal washings and aspirates are unacceptable for Xpert Xpress SARS-CoV-2/FLU/RSV testing.  Fact  Sheet for Patients: BloggerCourse.com  Fact Sheet for  Healthcare Providers: SeriousBroker.it  This test is not yet approved or cleared by the [place] and has been authorized for detection and/or diagnosis of SARS-CoV-2 by FDA under an Emergency Use Authorization (EUA). This EUA will remain in effect (meaning this test can be used) for the duration of the COVID-19 declaration under Section 564(b)(1) of the Act, 21 U.S.C. section 360bbb-3(b)(1), unless the authorization is terminated or revoked.  Performed at [facility], [address] [place], [place] [address]   MRSA Next Gen by PCR, Nasal     Status: None   Collection Time: 12/22/20  1:00 AM   Specimen: Nasal Mucosa; Nasal Swab  Result Value Ref Range Status   MRSA by PCR Next Gen NOT DETECTED NOT DETECTED Final    Comment: (NOTE) The GeneXpert MRSA Assay (FDA approved for NASAL specimens only), is one component of Doniesha Landau comprehensive MRSA colonization surveillance program. It is not intended to diagnose MRSA infection nor to guide or monitor treatment for MRSA infections. Test performance is not FDA approved in patients less than 82 years old. Performed at [facility], [address], [place], [place] [address]   Aerobic/Anaerobic Culture w Gram Stain (surgical/deep wound)     Status: None (Preliminary result)   Collection Time: 12/26/20  8:11 AM   Specimen: PATH Bone biopsy; Tissue  Result Value Ref Range Status   Specimen Description BONE  Final   Special Requests LEFT 1ST METATARSAL BONE FOR CULTURE SPEC Denson Niccoli  Final   Gram Stain   Final    NO WBC SEEN NO ORGANISMS SEEN Performed at [facility], [address], [place], [place] [address]    Culture PENDING  Incomplete   Report Status PENDING  Incomplete  Aerobic/Anaerobic Culture w Gram Stain (surgical/deep wound)     Status: None (Preliminary result)   Collection Time: 12/26/20  8:14 AM    Specimen: PATH Bone biopsy; Tissue  Result Value Ref Range Status   Specimen Description TISSUE  Final   Special Requests RIGHT 3RD METATARSAL BONE FOR CULTURE SPEC B  Final   Gram Stain   Final    NO WBC SEEN NO ORGANISMS SEEN Performed at [facility], [address] [place], [place] [address]    Culture PENDING  Incomplete   Report Status PENDING  Incomplete  Aerobic/Anaerobic Culture w Gram Stain (surgical/deep wound)     Status: None (Preliminary result)   Collection Time: 12/26/20  8:15 AM   Specimen: PATH Bone biopsy; Tissue  Result Value Ref Range Status   Specimen Description TISSUE  Final   Special Requests RIGHT 5TH METATARSAL BONE FOR CULTURE SPEC C  Final   Gram Stain   Final    NO WBC SEEN NO ORGANISMS SEEN Performed at [facility], [address] [place], [place] [address]    Culture PENDING  Incomplete   Report Status PENDING  Incomplete         Radiology Studies: No results found.      Scheduled Meds:  amLODipine  5 mg Oral Daily   artificial tears  1 application Both Eyes QHS   aspirin EC  81 mg Oral Daily   atorvastatin  10 mg Oral Daily   clopidogrel  75 mg Oral Daily   cycloSPORINE  1 drop Both Eyes BID   DULoxetine  30 mg Oral Daily   ferrous sulfate  325 mg Oral Daily   fluticasone  2 spray Each  Nare Daily   free water  180 mL Per Tube TID   gabapentin  300 mg Oral BID   heparin  5,000 Units Subcutaneous Q8H   hydrALAZINE  50 mg Oral TID   insulin aspart  0-9 Units Subcutaneous TID AC & HS   isosorbide mononitrate  60 mg Oral Daily   latanoprost  1 drop Both Eyes QHS   levothyroxine  125 mcg Oral QAC breakfast   loratadine  10 mg Oral Daily   mometasone-formoterol  2 puff Inhalation BID   multivitamin with minerals  1 tablet Oral Daily   nystatin cream  1 application Topical BID   pantoprazole  40 mg Oral BID   polyethylene glycol  17 g Oral BID   povidone-iodine   Topical Q1400   senna-docusate  1 tablet Oral QHS    sodium chloride flush  3 mL Intravenous Q12H   Continuous Infusions:  sodium chloride 250 mL (12/26/20 1420)   ceFEPime (MAXIPIME) IV 2 g (12/26/20 1422)   feeding supplement (OSMOLITE 1.5 CAL) Stopped (12/27/20 0943)   [START ON 12/28/2020] vancomycin       LOS: 6 days    Time spent: over 30 min    Lacretia Nicks, MD [facility]   To contact the attending provider between 7A-7P or the covering provider during after hours 7P-7A, please log into the web site [email or web address] and access using universal [place] password for that web site. If you do not have the password, please call the hospital operator.  12/27/2020, 10:14 AM

## 2020-12-27 NOTE — Evaluation (Signed)
Physical Therapy Evaluation Patient Details Name: Shannon [name] MRN: [id number] DOB: December 21, 1923 Today's Date: 12/27/2020  History of Present Illness  Shannon [name] is a 87 y.o. female with medical history significant of DM2, HTN, CKD 4, gastric stenosis s/p feeding tube.  She presented to the hospital after an episode of unresponsiveness.  She was brought to hospital by EMS.  She was complaining of bilateral foot pain.  Underwent I&D and bone biopsy of feet on 9/25 due to osteomyelitis L foot.  Clinical Impression  Patient presents with decreased mobility due to pain in feet, decreased balance with recent falls, decreased safety awareness, decreased general strength.  Was ambulating at home limited distances with rollator on her own and able to shower with assist of her daughter.  She currently needs A for sit to stand and for in room ambulation with RW.  Patient will benefit from skilled PT in the acute setting and from follow up HHPT at d/c.        Recommendations for follow up therapy are one component of a multi-disciplinary discharge planning process, led by the attending physician.  Recommendations may be updated based on patient status, additional functional criteria and insurance authorization.  Follow Up Recommendations Home health PT;Supervision for mobility/OOB    Equipment Recommendations  None recommended by PT    Recommendations for Other Services       Precautions / Restrictions Precautions Precautions: Fall Required Braces or Orthoses: Other Brace Other Brace: bilateral post op shoes Restrictions Weight Bearing Restrictions: No      Mobility  Bed Mobility Overal bed mobility: Needs Assistance Bed Mobility: Supine to Sit     Supine to sit: Min assist;HOB elevated     General bed mobility comments: assist for scooting to EOB    Transfers Overall transfer level: Needs assistance Equipment used: Rolling walker (2 wheeled) Transfers: Sit to/from Stand Sit to  Stand: Min assist;Mod assist         General transfer comment: slightly higher surface some lifting help  Ambulation/Gait Ambulation/Gait assistance: Min assist;Min guard Gait Distance (Feet): 35 Feet Assistive device: Rolling walker (2 wheeled) Gait Pattern/deviations: Step-to pattern;Decreased stride length;Antalgic     General Gait Details: min A for balance then progressed to minguard when pt more stable as progressed, walked to door then around bed to recliner  Stairs            Wheelchair Mobility    Modified Rankin (Stroke Patients Only)       Balance Overall balance assessment: Needs assistance   Sitting balance-Leahy Scale: Good     Standing balance support: Bilateral upper extremity supported Standing balance-Leahy Scale: Poor Standing balance comment: UE support, unsteady on feet with post op shoes and pain                             Pertinent Vitals/Pain Pain Assessment: 0-10 Pain Score: 6  Pain Location: in feet with ambulation Pain Descriptors / Indicators: Aching;Sore Pain Intervention(s): Monitored during session;Limited activity within patient's tolerance    Home Living Family/patient expects to be discharged to:: Private residence Living Arrangements: Children Available Help at Discharge: Available 24 hours/day Type of Home: House Home Access: Ramped entrance     Home Layout: One level Home Equipment: Environmental consultant - 4 wheels;Bedside commode;Tub bench;Hand held shower head;Wheelchair - manual      Prior Function Level of Independence: Needs assistance   Gait / Transfers Assistance Needed: rollator for  short ambulation distances   ADL's / Homemaking Assistance Needed: pt reorts independence with ADLs, daughter assists with medication managmenet, shower transfers, and cooking        Hand Dominance        Extremity/Trunk Assessment   Upper Extremity Assessment Upper Extremity Assessment: Overall WFL for tasks assessed     Lower Extremity Assessment Lower Extremity Assessment: RLE deficits/detail;LLE deficits/detail RLE Deficits / Details: feet wrapped with kerlix and ace wraps able to move ankles bilaterally, strength NT, hip flexion at least 3/5, knee extension 4/5 RLE Sensation: history of peripheral neuropathy LLE Deficits / Details: feet wrapped with kerlix and ace wraps able to move ankles bilaterally, strength NT, hip flexion at least 3/5, knee extension 4/5 LLE Sensation: history of peripheral neuropathy       Communication   Communication: HOH  Cognition Arousal/Alertness: Awake/alert Behavior During Therapy: WFL for tasks assessed/performed Overall Cognitive Status: History of cognitive impairments - at baseline                                 General Comments: slow processing along with [facility]      General Comments General comments (skin integrity, edema, etc.): daughter arrived end or session, replaced purewick per pt request, states incontinent at home    Exercises     Assessment/Plan    PT Assessment Patient needs continued PT services  PT Problem List Decreased mobility;Decreased activity tolerance;Decreased balance;Pain;Decreased knowledge of use of DME;Impaired sensation       PT Treatment Interventions DME instruction;Therapeutic activities;Therapeutic exercise;Patient/family education;Gait training;Balance training;Functional mobility training    PT Goals (Current goals can be found in the Care Plan section)  Acute Rehab PT Goals Patient Stated Goal: to heal feet PT Goal Formulation: With patient/family Time For Goal Achievement: 01/10/21 Potential to Achieve Goals: Good    Frequency Min 3X/week   Barriers to discharge        Co-evaluation               AM-PAC PT "6 Clicks" Mobility  Outcome Measure Help needed turning from your back to your side while in a flat bed without using bedrails?: A Little Help needed moving from lying on your back to  sitting on the side of a flat bed without using bedrails?: A Little Help needed moving to and from a bed to a chair (including a wheelchair)?: A Little Help needed standing up from a chair using your arms (e.g., wheelchair or bedside chair)?: A Lot Help needed to walk in hospital room?: A Little Help needed climbing 3-5 steps with a railing? : A Lot 6 Click Score: 16    End of Session Equipment Utilized During Treatment: Gait belt Activity Tolerance: Patient limited by pain Patient left: in chair;with call bell/phone within reach;with family/visitor present Nurse Communication: Mobility status PT Visit Diagnosis: Other abnormalities of gait and mobility (R26.89);Difficulty in walking, not elsewhere classified (R26.2)    Time: 4098-1191 PT Time Calculation (min) (ACUTE ONLY): 27 min   Charges:   PT Evaluation $PT Eval Moderate Complexity: 1 Mod PT Treatments $Gait Training: 8-22 mins        [name], PT Acute Rehabilitation Services [phone] Office:[phone] 12/27/2020   Elray Mcgregor 12/27/2020, 11:36 AM

## 2020-12-28 ENCOUNTER — Inpatient Hospital Stay (HOSPITAL_COMMUNITY): Payer: Medicare Other

## 2020-12-28 DIAGNOSIS — M86172 Other acute osteomyelitis, left ankle and foot: Secondary | ICD-10-CM | POA: Diagnosis not present

## 2020-12-28 LAB — CBC WITH DIFFERENTIAL/PLATELET
Abs Immature Granulocytes: 0.04 10*3/uL (ref 0.00–0.07)
Abs Immature Granulocytes: 0.03 10*3/uL (ref 0.00–0.07)
Basophils Absolute: 0 10*3/uL (ref 0.0–0.1)
Basophils Absolute: 0 10*3/uL (ref 0.0–0.1)
Basophils Relative: 1 %
Basophils Relative: 1 %
Eosinophils Absolute: 0.2 10*3/uL (ref 0.0–0.5)
Eosinophils Absolute: 0.3 10*3/uL (ref 0.0–0.5)
Eosinophils Relative: 5 %
Eosinophils Relative: 5 %
HCT: 24.5 % — ABNORMAL LOW (ref 36.0–46.0)
HCT: 25.5 % — ABNORMAL LOW (ref 36.0–46.0)
Hemoglobin: 8.3 g/dL — ABNORMAL LOW (ref 12.0–15.0)
Hemoglobin: 8.7 g/dL — ABNORMAL LOW (ref 12.0–15.0)
Immature Granulocytes: 1 %
Immature Granulocytes: 1 %
Lymphocytes Relative: 22 %
Lymphocytes Relative: 19 %
Lymphs Abs: 0.9 10*3/uL (ref 0.7–4.0)
Lymphs Abs: 1 10*3/uL (ref 0.7–4.0)
MCH: 32.2 pg (ref 26.0–34.0)
MCH: 32.2 pg (ref 26.0–34.0)
MCHC: 33.9 g/dL (ref 30.0–36.0)
MCHC: 34.1 g/dL (ref 30.0–36.0)
MCV: 95 fL (ref 80.0–100.0)
MCV: 94.4 fL (ref 80.0–100.0)
Monocytes Absolute: 0.8 10*3/uL (ref 0.1–1.0)
Monocytes Absolute: 1 10*3/uL (ref 0.1–1.0)
Monocytes Relative: 20 %
Monocytes Relative: 20 %
Neutro Abs: 2 10*3/uL (ref 1.7–7.7)
Neutro Abs: 2.9 10*3/uL (ref 1.7–7.7)
Neutrophils Relative %: 51 %
Neutrophils Relative %: 54 %
Platelets: 177 10*3/uL (ref 150–400)
Platelets: 183 10*3/uL (ref 150–400)
RBC: 2.58 MIL/uL — ABNORMAL LOW (ref 3.87–5.11)
RBC: 2.7 MIL/uL — ABNORMAL LOW (ref 3.87–5.11)
RDW: 17 % — ABNORMAL HIGH (ref 11.5–15.5)
RDW: 16.9 % — ABNORMAL HIGH (ref 11.5–15.5)
WBC: 3.9 10*3/uL — ABNORMAL LOW (ref 4.0–10.5)
WBC: 5.2 10*3/uL (ref 4.0–10.5)
nRBC: 0 % (ref 0.0–0.2)
nRBC: 0 % (ref 0.0–0.2)

## 2020-12-28 LAB — COMPREHENSIVE METABOLIC PANEL
ALT: 45 U/L — ABNORMAL HIGH (ref 0–44)
ALT: 42 U/L (ref 0–44)
AST: 58 U/L — ABNORMAL HIGH (ref 15–41)
AST: 47 U/L — ABNORMAL HIGH (ref 15–41)
Albumin: 2.5 g/dL — ABNORMAL LOW (ref 3.5–5.0)
Albumin: 2.5 g/dL — ABNORMAL LOW (ref 3.5–5.0)
Alkaline Phosphatase: 78 U/L (ref 38–126)
Alkaline Phosphatase: 82 U/L (ref 38–126)
Anion gap: 5 (ref 5–15)
Anion gap: 4 — ABNORMAL LOW (ref 5–15)
BUN: 49 mg/dL — ABNORMAL HIGH (ref 8–23)
BUN: 47 mg/dL — ABNORMAL HIGH (ref 8–23)
CO2: 22 mmol/L (ref 22–32)
CO2: 24 mmol/L (ref 22–32)
Calcium: 8.9 mg/dL (ref 8.9–10.3)
Calcium: 9.1 mg/dL (ref 8.9–10.3)
Chloride: 103 mmol/L (ref 98–111)
Chloride: 103 mmol/L (ref 98–111)
Creatinine, Ser: 1.15 mg/dL — ABNORMAL HIGH (ref 0.44–1.00)
Creatinine, Ser: 1.07 mg/dL — ABNORMAL HIGH (ref 0.44–1.00)
GFR, Estimated: 44 mL/min — ABNORMAL LOW (ref >60)
GFR, Estimated: 48 mL/min — ABNORMAL LOW (ref >60)
Glucose, Bld: 142 mg/dL — ABNORMAL HIGH (ref 70–99)
Glucose, Bld: 95 mg/dL (ref 70–99)
Potassium: 4.4 mmol/L (ref 3.5–5.1)
Potassium: 4.7 mmol/L (ref 3.5–5.1)
Sodium: 130 mmol/L — ABNORMAL LOW (ref 135–145)
Sodium: 131 mmol/L — ABNORMAL LOW (ref 135–145)
Total Bilirubin: 0.4 mg/dL (ref 0.3–1.2)
Total Bilirubin: 0.6 mg/dL (ref 0.3–1.2)
Total Protein: 5.9 g/dL — ABNORMAL LOW (ref 6.5–8.1)
Total Protein: 6.1 g/dL — ABNORMAL LOW (ref 6.5–8.1)

## 2020-12-28 LAB — GLUCOSE, CAPILLARY
Glucose-Capillary: 132 mg/dL — ABNORMAL HIGH (ref 70–99)
Glucose-Capillary: 149 mg/dL — ABNORMAL HIGH (ref 70–99)
Glucose-Capillary: 149 mg/dL — ABNORMAL HIGH (ref 70–99)
Glucose-Capillary: 91 mg/dL (ref 70–99)
Glucose-Capillary: 118 mg/dL — ABNORMAL HIGH (ref 70–99)
Glucose-Capillary: 88 mg/dL (ref 70–99)
Glucose-Capillary: 88 mg/dL (ref 70–99)
Glucose-Capillary: 90 mg/dL (ref 70–99)

## 2020-12-28 LAB — LACTIC ACID, PLASMA
Lactic Acid, Venous: 1.2 mmol/L (ref 0.5–1.9)
Lactic Acid, Venous: 1.1 mmol/L (ref 0.5–1.9)

## 2020-12-28 LAB — PHOSPHORUS: Phosphorus: 3 mg/dL (ref 2.5–4.6)

## 2020-12-28 LAB — MAGNESIUM: Magnesium: 1.8 mg/dL (ref 1.7–2.4)

## 2020-12-28 MED ORDER — MORPHINE SULFATE (PF) 2 MG/ML IV SOLN
0.5000 mg | INTRAVENOUS | Status: DC | PRN
  Administered 2020-12-28: 0.5 mg via INTRAVENOUS
  Filled 2020-12-28: qty 1

## 2020-12-28 MED ORDER — LACTATED RINGERS IV BOLUS
500.0000 mL | Freq: Once | INTRAVENOUS | Status: AC
  Administered 2020-12-28: 250 mL via INTRAVENOUS

## 2020-12-28 MED ORDER — IOHEXOL 350 MG/ML SOLN
50.0000 mL | Freq: Once | INTRAVENOUS | Status: AC | PRN
  Administered 2020-12-28: 50 mL via INTRAVENOUS

## 2020-12-28 MED ORDER — FERROUS SULFATE 220 (44 FE) MG/5ML PO ELIX
325.0000 mg | ORAL_SOLUTION | Freq: Every day | ORAL | Status: DC
  Administered 2020-12-28 – 2020-12-29 (×2): 325 mg
  Filled 2020-12-28 (×3): qty 7.4

## 2020-12-28 MED ORDER — DEXTROSE IN LACTATED RINGERS 5 % IV SOLN: INTRAVENOUS | Status: AC

## 2020-12-28 MED ORDER — MORPHINE SULFATE (PF) 2 MG/ML IV SOLN: 0.5000 mg | INTRAVENOUS | Status: DC | PRN

## 2020-12-28 MED ORDER — PIPERACILLIN-TAZOBACTAM 3.375 G IVPB
3.3750 g | Freq: Three times a day (TID) | INTRAVENOUS | Status: DC
  Administered 2020-12-29: 3.375 g via INTRAVENOUS
  Filled 2020-12-28 (×3): qty 50

## 2020-12-28 MED ORDER — ACETAMINOPHEN 500 MG PO TABS
1000.0000 mg | ORAL_TABLET | ORAL | Status: AC
  Administered 2020-12-28: 1000 mg
  Filled 2020-12-28: qty 2

## 2020-12-28 NOTE — Progress Notes (Signed)
Pt developed a severe headache on the left side of head that radiated from left side of neck. Pt states that she doesn't feel well. Pt became hard to keep concentrated and she did not want to have her eyes open. Blood sugar was checked. Dr. Florene Glen was paged and he came to bedside. Pt was responding to stimuli and continued to express pain. Per MAR orders from Dr. Florene Glen, Tylenol was administered, as well as morphine. Rectal temperature was obtained. EKG was also performed. Pt was then taken to CT for scan of head. Once returned from CT pt stated headache was not as severe and was subsiding. Pt continued with experiencing fatigue and drowsiness. Pt continuing to say that she just does not feel well. Spoke with Dr. Florene Glen and advised pt's statement. Bolus of LR 253ml given per verbal from Dr. Florene Glen. Pt tolerated well. Pt continued with stating that she does not feel well. Pt unable to explain what exactly she is feeling. Dr. Florene Glen advised.

## 2020-12-28 NOTE — Progress Notes (Addendum)
PROGRESS NOTE    ELLENMARIE [name]  QQV:[id number] DOB: 30-Oct-1923 DOA: 12/21/2020 PCP: Sharon Seller, NP   No chief complaint on file.   Brief Narrative:  Shannon [name] is Shannon [name] 87 y.o. female with medical history significant of DM2, HTN, CKD 4, gastric stenosis s/p feeding tube.  She presented to the hospital after an episode of unresponsiveness.  She was brought to hospital by EMS.  She was complaining of bilateral foot pain.    She's being seen for bilateral lower extremity osteo.   She's s/p debridement with specimen sent for culture.    ID following and left recs for 9/26.  She developed severe headache and general malaise on 9/27, currently undergoing workup.  Assessment & Plan:   Principal Problem:   Osteomyelitis of left foot (HCC) Active Problems:   Type 2 diabetes mellitus with chronic kidney disease (HCC)   Accelerated hypertension   Acute renal failure superimposed on stage 4 chronic kidney disease (HCC)   Chronic diastolic CHF (congestive heart failure) (HCC)   Osteomyelitis of right foot (HCC)   Diabetic infection of right foot (HCC)   Diabetic foot infection (HCC)  Headache   Severe HA, came on all of Breyah Akhter sudden about 20 min before my eval SBP ~140, not tachy, afebrile (rectal temp) Daughter note chronic HA's at home, but this seems out of proportion No focal neuro deficits appreciated on exam CT head EKG without concerning findings Trial APAP and morphine  General Malaise In addition to HA, feels generally poorly Difficult describing this well, just complains of not feeling well UA/culture (she c/o discomfort with urination) Will follow CT abdomen/pelvis - she has Psalm Arman hernia as well Follow CXR Change cefepime to zosyn in case this cefepime causing encephalopathy? Earlier today she was pleasantly interacting and walking to bathroom with RN - she's [place]&O at baseline.    Bilateral Diabetic Foot Infections  Osteomyelitis MRI R foot notable for acute osteo of  3rd metatarsal, bone marrow edema at resection margin of 5th metatarsal, diffuse soft tissue swelling at the distal stump as well as the dorsum of the forefoot.   MRI L foot notable for dislocation and hallux valgus at the first MTP joint with boy erosion particularaly in the head of the first metatarsal medially, but also in the base of the proximal phalanx, with marrow edema in both of the structures - concerning for osteo and possible septic joint.  Transverse fx of head of 3rd metatarsa, ? Superimposed osteo.   R foot ulcer with enterococcus 8 days ago L foot ulcer with pseudomonas 3 months ago Infectious disease consult, appreciate recs - recommending holding abx for now, recommending vascular eval regarding risk of debridement/healing.  Will continue broad spectrum abx, vanc/cefepime -> Follow final recs (per note from 9/26, could discharge on augmentin x4 weeks and reassess - they'll follow cultures) Vascular c/s - s/p RLE angiogram 9/23 - per vascular, pt optimized in both lower extremities from vascular surgery standpoint.  ASA, plavix, statin indefinitely.  Podiatry c/s, appreciate recs - s/p irrigation and debridement, foot and bone biopsy (bilateral) 9/25 -> follow pending cultures Ngx3 days  PAD S/p L popliteal angioplasty 10/20/20 S/p RLE angiogram 9/23 recommending aspirin indefinitely, plavix indefinitely.  Statin indefinitely.  Vascular c/s, appreciate assistance with PAD and above -> safe to proceed with debridement of foot per podiatry.  AKI on CKD IIIB Baseline creatinine appears to be around 1.45 1.68 at presentation Better than recent baseline  HTN Continue home BP  meds  T2DM A1c 5.5 last month Diet controlled Continue BG check  HFpEF Holding lasix given aki on presentation  Gastric stenosis s/p feeding tube Continue tube feeds   DVT prophylaxis: heparin Code Status: DNR Family Communication: daughter and son at bedside - granddaughter and wife as well   Disposition:   Status is: Inpatient  Remains inpatient appropriate because:Inpatient level of care appropriate due to severity of illness  Dispo: The patient is from: Home              Anticipated d/c is to:  pending              Patient currently is not medically stable to d/c.   Difficult to place patient No       Consultants:  ID podiatry  Procedures:  PROCEDURE:   1) US guided left common femoral artery access 2) Right lower extremity angiogram with second order cannulation (35mL total contrast)  Irrigation and debridement, foot and bone biopsy 9/25  Antimicrobials:  Anti-infectives (From admission, onward)    Start     Dose/Rate Route Frequency Ordered Stop   12/28/20 1900  vancomycin (VANCOCIN) IVPB 1000 mg/200 mL premix        1,000 mg 200 mL/hr over 60 Minutes Intravenous Every 48 hours 12/26/20 1203     12/26/20 1230  ceFEPIme (MAXIPIME) 2 g in sodium chloride 0.9 % 100 mL IVPB        2 g 200 mL/hr over 30 Minutes Intravenous Every 24 hours 12/26/20 1142     12/26/20 1230  vancomycin (VANCOREADY) IVPB 1250 mg/250 mL        1,250 mg 166.7 mL/hr over 90 Minutes Intravenous  Once 12/26/20 1142 12/26/20 2209   12/21/20 2200  piperacillin-tazobactam (ZOSYN) IVPB 3.375 g  Status:  Discontinued        3.375 g 12.5 mL/hr over 240 Minutes Intravenous Every 12 hours 12/21/20 2105 12/22/20 1157   12/21/20 2030  vancomycin (VANCOREADY) IVPB 1250 mg/250 mL  Status:  Discontinued        1,250 mg 166.7 mL/hr over 90 Minutes Intravenous  Once 12/21/20 2022 12/21/20 2035          Subjective: C/o headache  C/o just not feeling well   Objective: Vitals:   12/28/20 0430 12/28/20 0811 12/28/20 0959 12/28/20 1030  BP: (!) 130/50  (!) 151/56 (!) 129/51  Pulse: 73     Resp: 17     Temp: 97.9 F (36.6 C)     TempSrc: Oral     SpO2: 99% 99%    Weight: 58.6 kg     Height:        Intake/Output Summary (Last 24 hours) at 12/28/2020 1246 Last data filed at 12/27/2020  2033 Gross per 24 hour  Intake 1070.89 ml  Output 600 ml  Net 470.89 ml   Filed Weights   12/26/20 0736 12/27/20 0419 12/28/20 0430  Weight: 57.2 kg 58.9 kg 58.6 kg    Examination:  General: appears uncomfortable, complaining of HA Cardiovascular: RRR Lungs: unlabored Abdomen: Soft, nontender, nondistended - PEG insertion site well appearing - hernia reducible  Neurological: no focal deficits.   Moving all extremities.  PERRL.   Extremities: No clubbing or cyanosis. No edema.       Data Reviewed: I have personally reviewed following labs and imaging studies  CBC: Recent Labs  Lab 12/24/20 0424 12/25/20 0210 12/26/20 0220 12/27/20 0103 12/28/20 0417  WBC 3.4* 4.5 4.6 4.3 3.9*  NEUTROABS  1.7 3.0 2.8 2.9 2.0  HGB 9.1* 8.6* 8.9* 8.6* 8.3*  HCT 26.9* 26.2* 26.1* 25.3* 24.5*  MCV 96.8 97.0 94.6 95.5 95.0  PLT 189 190 175 179 177    Basic Metabolic Panel: Recent Labs  Lab 12/24/20 0424 12/25/20 0210 12/26/20 0220 12/27/20 0103 12/28/20 0417  NA 136 133* 131* 129* 130*  K 4.1 4.1 4.7 4.3 4.4  CL 108 106 102 100 103  CO2 23 21* 23 21* 22  GLUCOSE 168* 154* 87 191* 142*  BUN 31* 27* 26* 39* 49*  CREATININE 1.05* 1.00 1.05* 1.21* 1.15*  CALCIUM 8.8* 8.9 9.1 8.6* 8.9  MG 1.8 1.8 1.8 1.7 1.8  PHOS 3.1 2.6 3.6 3.5 3.0    GFR: Estimated Creatinine Clearance: 27.1 mL/min (Arshi Duarte) (by C-G formula based on SCr of 1.15 mg/dL (H)).  Liver Function Tests: Recent Labs  Lab 12/24/20 0424 12/25/20 0210 12/26/20 0220 12/27/20 0103 12/28/20 0417  AST 20 26 40 57* 58*  ALT 12 16 25  38 45*  ALKPHOS 77 75 75 78 78  BILITOT 0.6 0.6 0.7 0.6 0.4  PROT 6.2* 6.3* 6.2* 5.9* 5.9*  ALBUMIN 2.5* 2.6* 2.5* 2.4* 2.5*    CBG: Recent Labs  Lab 12/27/20 1645 12/27/20 2031 12/28/20 0018 12/28/20 0450 12/28/20 0749  GLUCAP 91 80 149* 149* 132*     Recent Results (from the past 240 hour(s))  Blood culture (routine x 2)     Status: None   Collection Time: 12/21/20 10:29 AM    Specimen: BLOOD  Result Value Ref Range Status   Specimen Description BLOOD RIGHT ANTECUBITAL  Final   Special Requests   Final    BOTTLES DRAWN AEROBIC ONLY Blood Culture results may not be optimal due to an inadequate volume of blood received in culture bottles   Culture   Final    NO GROWTH 5 DAYS Performed at [facility], [address], [place], [place] [address]    Report Status 12/26/2020 FINAL  Final  Blood culture (routine x 2)     Status: None   Collection Time: 12/21/20 10:34 AM   Specimen: BLOOD  Result Value Ref Range Status   Specimen Description BLOOD SITE NOT SPECIFIED  Final   Special Requests   Final    BOTTLES DRAWN AEROBIC ONLY Blood Culture results may not be optimal due to an inadequate volume of blood received in culture bottles   Culture   Final    NO GROWTH 5 DAYS Performed at [facility], [address], [place], [place] [address]    Report Status 12/26/2020 FINAL  Final  Resp Panel by RT-PCR (Flu Hartlyn Reigel&B, Covid) Urine, Clean Catch     Status: None   Collection Time: 12/21/20  8:34 PM   Specimen: Urine, Clean Catch; Nasopharyngeal(NP) swabs in vial transport medium  Result Value Ref Range Status   SARS Coronavirus 2 by RT PCR NEGATIVE NEGATIVE Final    Comment: (NOTE) SARS-CoV-2 target nucleic acids are NOT DETECTED.  The SARS-CoV-2 RNA is generally detectable in upper respiratory specimens during the acute phase of infection. The lowest concentration of SARS-CoV-2 viral copies this assay can detect is 138 copies/mL. Melisia Leming negative result does not preclude SARS-Cov-2 infection and should not be used as the sole basis for treatment or other patient management decisions. Havah Ammon negative result may occur with  improper specimen collection/handling, submission of specimen other than nasopharyngeal swab, presence of viral mutation(s) within the areas targeted by this assay,  and inadequate number of viral copies(<138 copies/mL). Copeland Lapier negative result must be  combined with clinical observations, patient history, and epidemiological information. The expected result is Negative.  Fact Sheet for Patients:  BloggerCourse.com  Fact Sheet for Healthcare Providers:  SeriousBroker.it  This test is no t yet approved or cleared by the [place] FDA and  has been authorized for detection and/or diagnosis of SARS-CoV-2 by FDA under an Emergency Use Authorization (EUA). This EUA will remain  in effect (meaning this test can be used) for the duration of the COVID-19 declaration under Section 564(b)(1) of the Act, 21 U.S.C.section 360bbb-3(b)(1), unless the authorization is terminated  or revoked sooner.       Influenza Tyjuan Demetro by PCR NEGATIVE NEGATIVE Final   Influenza B by PCR NEGATIVE NEGATIVE Final    Comment: (NOTE) The Xpert Xpress SARS-CoV-2/FLU/RSV plus assay is intended as an aid in the diagnosis of influenza from Nasopharyngeal swab specimens and should not be used as Mikita Lesmeister sole basis for treatment. Nasal washings and aspirates are unacceptable for Xpert Xpress SARS-CoV-2/FLU/RSV testing.  Fact Sheet for Patients: BloggerCourse.com  Fact Sheet for Healthcare Providers: SeriousBroker.it  This test is not yet approved or cleared by the [place] FDA and has been authorized for detection and/or diagnosis of SARS-CoV-2 by FDA under an Emergency Use Authorization (EUA). This EUA will remain in effect (meaning this test can be used) for the duration of the COVID-19 declaration under Section 564(b)(1) of the Act, 21 U.S.C. section 360bbb-3(b)(1), unless the authorization is terminated or revoked.  Performed at [facility], [address], [place], [place] [address]   MRSA Next Gen by PCR, Nasal     Status: None   Collection Time: 12/22/20  1:00 AM   Specimen: Nasal Mucosa; Nasal Swab  Result Value Ref Range Status   MRSA by PCR Next  Gen NOT DETECTED NOT DETECTED Final    Comment: (NOTE) The GeneXpert MRSA Assay (FDA approved for NASAL specimens only), is one component of Essica Kiker comprehensive MRSA colonization surveillance program. It is not intended to diagnose MRSA infection nor to guide or monitor treatment for MRSA infections. Test performance is not FDA approved in patients less than 34 years old. Performed at [facility], [address] [place], [place] [address]   Aerobic/Anaerobic Culture w Gram Stain (surgical/deep wound)     Status: None (Preliminary result)   Collection Time: 12/26/20  8:11 AM   Specimen: PATH Bone biopsy; Tissue  Result Value Ref Range Status   Specimen Description BONE  Final   Special Requests LEFT 1ST METATARSAL BONE FOR CULTURE SPEC Tyliek Timberman  Final   Gram Stain NO WBC SEEN NO ORGANISMS SEEN   Final   Culture   Final    NO GROWTH 2 DAYS Performed at [facility], [address], [place], [place] [address]    Report Status PENDING  Incomplete  Aerobic/Anaerobic Culture w Gram Stain (surgical/deep wound)     Status: None (Preliminary result)   Collection Time: 12/26/20  8:14 AM   Specimen: PATH Bone biopsy; Tissue  Result Value Ref Range Status   Specimen Description TISSUE  Final   Special Requests RIGHT 3RD METATARSAL BONE FOR CULTURE SPEC B  Final   Gram Stain NO WBC SEEN NO ORGANISMS SEEN   Final   Culture   Final    NO GROWTH 2 DAYS Performed at [facility], [address] [place], [place] [address]  Report Status PENDING  Incomplete  Aerobic/Anaerobic Culture w Gram Stain (surgical/deep wound)     Status: None (Preliminary result)   Collection Time: 12/26/20  8:15 AM   Specimen: PATH Bone biopsy; Tissue  Result Value Ref Range Status   Specimen Description TISSUE  Final   Special Requests RIGHT 5TH METATARSAL BONE FOR CULTURE SPEC C  Final   Gram Stain NO WBC SEEN NO ORGANISMS SEEN   Final   Culture   Final    NO GROWTH 2 DAYS Performed at [facility], [address] [place], [place] [address]    Report Status PENDING  Incomplete         Radiology Studies: No results found.      Scheduled Meds:  amLODipine  5 mg Oral Daily   artificial tears  1 application Both Eyes QHS   aspirin EC  81 mg Oral Daily   atorvastatin  10 mg Oral Daily   clopidogrel  75 mg Oral Daily   cycloSPORINE  1 drop Both Eyes BID   DULoxetine  30 mg Oral Daily   ferrous sulfate  325 mg Per Tube Daily   fluticasone  2 spray Each Nare Daily   free water  180 mL Per Tube TID   gabapentin  300 mg Oral BID   heparin  5,000 Units Subcutaneous Q8H   hydrALAZINE  50 mg Oral TID   insulin aspart  0-9 Units Subcutaneous TID AC & HS   isosorbide mononitrate  60 mg Oral Daily   latanoprost  1 drop Both Eyes QHS   levothyroxine  125 mcg Oral QAC breakfast   loratadine  10 mg Oral Daily   mometasone-formoterol  2 puff Inhalation BID   multivitamin with minerals  1 tablet Oral Daily   nystatin cream  1 application Topical BID   pantoprazole  40 mg Oral BID   polyethylene glycol  17 g Oral BID   povidone-iodine   Topical Q1400   senna-docusate  1 tablet Oral QHS   sodium chloride flush  3 mL Intravenous Q12H   Continuous Infusions:  sodium chloride 10 mL/hr at 12/27/20 1838   ceFEPime (MAXIPIME) IV Stopped (12/27/20 1518)   feeding supplement (OSMOLITE 1.5 CAL) Stopped (12/28/20 1001)   vancomycin       LOS: 7 days    Time spent: over 30 min    Lacretia Nicks, MD [facility]   To contact the attending provider between 7A-7P or the covering provider during after hours 7P-7A, please log into the web site [email or web address] and access using universal [place] password for that web site. If you do not have the password, please call the hospital operator.  12/28/2020, 12:46 PM

## 2020-12-28 NOTE — Progress Notes (Signed)
I came to see patient but she was indisposed with bathing with nursing.  Cultures reviewed no changes and culture data still pending with no growth and no organisms.  Hopeful her final results in next 24 hours and then can discharge.  I discussed this with her daughter [name] on the phone and she is in agreement.  Continue every other day dressing changes with silver alginate dressings, this should continue through discharge.  We will see her in the morning and reevaluate  Lanae Crumbly, [facility] 12/28/2020

## 2020-12-28 NOTE — Progress Notes (Signed)
Pharmacy Antibiotic Note  Shannon [name] is a 87 y.o. female admitted on 12/21/2020 with  osteomyelitis .  Pharmacy has been consulted for Zosyn dosing. WBC WNL. Renal function age appropriate. Pt with increasing encephalopathy. MD would like to change Cefepime to Zosyn to r/o Cefepime as causative agent for encephalopathy.   Plan: Already on Vancomycin  Add Zosyn 3.375G IV q8h to be infused over 4 hours   Height: 5\' 6"  (167.6 cm) Weight: 58.6 kg (129 lb 3 oz) IBW/kg (Calculated) : 59.3  Temp (24hrs), Avg:98.2 F (36.8 C), Min:97.7 F (36.5 C), Max:98.7 F (37.1 C)  Recent Labs  Lab 12/25/20 0210 12/26/20 0220 12/27/20 0103 12/28/20 0417 12/28/20 1539 12/28/20 1824  WBC 4.5 4.6 4.3 3.9* 5.2  --   CREATININE 1.00 1.05* 1.21* 1.15* 1.07*  --   LATICACIDVEN  --   --   --   --  1.2 1.1    Estimated Creatinine Clearance: 29.1 mL/min (A) (by C-G formula based on SCr of 1.07 mg/dL (H)).    No Active Allergies  [name], PharmD, BCPS Clinical Pharmacist Phone: [phone]

## 2020-12-28 NOTE — Op Note (Signed)
PATIENT:  Shannon [name]  87 y.o. female   PRE-OPERATIVE DIAGNOSIS:  BILATERAL FOOT ULCERS   POST-OPERATIVE DIAGNOSIS:  BILATERAL FOOT ULCERS   PROCEDURE:  Procedure(s): IRRIGATION AND DEBRIDEMENT FOOT AND BONE BIOPSY (Bilateral)   SURGEON:  Surgeon(s) and Role:    * Trula Slade, DPM - Primary   PHYSICIAN ASSISTANT:    ASSISTANTS: none    ANESTHESIA:   general   EBL:  75 mL    BLOOD ADMINISTERED:none   DRAINS: none    LOCAL MEDICATIONS USED:  OTHER 10 cc lidocaine and marcaine plain   SPECIMEN:  Source of Specimen:  left 1st, right 3rd and right 5th metatarsal   DISPOSITION OF SPECIMEN:  PATHOLOGY   COUNTS:  YES   TOURNIQUET:  * No tourniquets in log *   DICTATION: .Dragon Dictation   PLAN OF CARE: Admit to inpatient    PATIENT DISPOSITION:  PACU - hemodynamically stable.   Delay start of Pharmacological VTE agent (>24hrs) due to surgical blood loss or risk of bleeding: no  Indications for surgery: 87 year old female originally presented to the emergency room for unresponsiveness.  She had foot pain at the time and MRI was performed which did reveal osteomyelitis bilaterally.  She had chronic ulcerations present as well.  Upon admission she was not septic and she was stable otherwise.  Antibiotics were held for possible biopsy.  Vascular surgery optimize bilateral lower extremities.  I discussed with the patient, family, daughter in regards to possible debridement of the wounds as well as bone biopsies.  After discussion they agreed to proceed with this.  I discussed the surgery as well as postoperative course.  Alternatives risks and complications were discussed.  No promises or guarantees given.  Procedure in detail: The patient was both verbally and visually identified by myself, nursing staff and the anesthesia staff in the preoperative holding area.  She was transferred to the operating room via stretcher placed in the eligible supine position.  LMA was  placed.  Bilateral lower extremities and scrubbed prepped and draped in normal sterile fashion.  Attention was directed first to the left foot on the medial first MPJ.  A small stab incision was made on the dorsal first metatarsal.  Jamshidi needle was utilized to remove the piece of bone from the first metatarsal which was sent to microbiology.  The toe did appear to be hard in nature.  The wound was then debrided utilizing the Misonix microdebrider system.  After debridement of the wound there is no probing to bone, undermining or tunneling.  It measured 2 x 1.2 x 0.1 cm.  Prior debridement was smaller approximately 1.5 x 1 cm with superficial.  Attention was then directed to the right foot.  On the dorsal aspect the right fifth metatarsal small stab incision was made.  A Jamshidi needle was then utilized to remove a piece of bone from the fifth metatarsal.  Again this but appeared to be hard in nature.  This was sent to microbiology.  No probing to bone, undermining or tunneling.  Pathology.  Again utilized the Misonix debridement system. after debridement of the wound measured 1 x 1 x 0.2 cm.  Prior to debridement the wound was smaller 0.8 x 0.8 x 0.1 cm.  Attention was then directed to the wound on the right foot on the plantar aspect of the remnant of the third metatarsal.  I made a small stab incision on the plantar aspect just proximal to the wound for the  biopsy.  Jamshidi was then again utilized to remove tissue.  This point was found to be soft, likely osteomyelitis.  This was sent to microbiology as well.  The wound was then debrided utilizing the Misonix debridement system as well.  This wound did have a small mount of undermining approximately 0.3 cm in all directions.  There is no probing to bone although it was close.  The wound measured 1.4 x 1.4 x 0.4 cm after debridement.  Prior to debridement it was 1 x 1 x 0.2 cm.    There is no purulence noted to any of the wounds today.  I utilized  nylon suture to close the biopsy sites after the incisions were irrigated and hemostasis achieved.  Xeroform was applied to the wounds followed by dry sterile dressing.  At the conclusion of the procedure she was awoken from anesthesia and found to tolerate the procedure well and complications.  She was transferred to PACU vital signs stable and vascular status intact.  Postoperative course: Patient is remained inpatient and will start empiric antibiotics and await culture results before final antibiotic recommendations for discharge.

## 2020-12-29 ENCOUNTER — Inpatient Hospital Stay (HOSPITAL_COMMUNITY): Payer: Medicare Other

## 2020-12-29 DIAGNOSIS — M86172 Other acute osteomyelitis, left ankle and foot: Secondary | ICD-10-CM | POA: Diagnosis not present

## 2020-12-29 LAB — CBC WITH DIFFERENTIAL/PLATELET
Abs Immature Granulocytes: 0.02 10*3/uL (ref 0.00–0.07)
Basophils Absolute: 0.1 10*3/uL (ref 0.0–0.1)
Basophils Relative: 1 %
Eosinophils Absolute: 0.2 10*3/uL (ref 0.0–0.5)
Eosinophils Relative: 5 %
HCT: 25.2 % — ABNORMAL LOW (ref 36.0–46.0)
Hemoglobin: 8.6 g/dL — ABNORMAL LOW (ref 12.0–15.0)
Immature Granulocytes: 0 %
Lymphocytes Relative: 21 %
Lymphs Abs: 1 10*3/uL (ref 0.7–4.0)
MCH: 32.3 pg (ref 26.0–34.0)
MCHC: 34.1 g/dL (ref 30.0–36.0)
MCV: 94.7 fL (ref 80.0–100.0)
Monocytes Absolute: 0.9 10*3/uL (ref 0.1–1.0)
Monocytes Relative: 21 %
Neutro Abs: 2.3 10*3/uL (ref 1.7–7.7)
Neutrophils Relative %: 52 %
Platelets: 187 10*3/uL (ref 150–400)
RBC: 2.66 MIL/uL — ABNORMAL LOW (ref 3.87–5.11)
RDW: 17.1 % — ABNORMAL HIGH (ref 11.5–15.5)
WBC: 4.5 10*3/uL (ref 4.0–10.5)
nRBC: 0 % (ref 0.0–0.2)

## 2020-12-29 LAB — URINALYSIS, COMPLETE (UACMP) WITH MICROSCOPIC
Bacteria, UA: NONE SEEN
Bilirubin Urine: NEGATIVE
Glucose, UA: NEGATIVE mg/dL
Hgb urine dipstick: NEGATIVE
Ketones, ur: NEGATIVE mg/dL
Leukocytes,Ua: NEGATIVE
Nitrite: NEGATIVE
Protein, ur: NEGATIVE mg/dL
Specific Gravity, Urine: 1.018 (ref 1.005–1.030)
pH: 6 (ref 5.0–8.0)

## 2020-12-29 LAB — COMPREHENSIVE METABOLIC PANEL
ALT: 36 U/L (ref 0–44)
AST: 38 U/L (ref 15–41)
Albumin: 2.4 g/dL — ABNORMAL LOW (ref 3.5–5.0)
Alkaline Phosphatase: 80 U/L (ref 38–126)
Anion gap: 5 (ref 5–15)
BUN: 42 mg/dL — ABNORMAL HIGH (ref 8–23)
CO2: 23 mmol/L (ref 22–32)
Calcium: 9 mg/dL (ref 8.9–10.3)
Chloride: 103 mmol/L (ref 98–111)
Creatinine, Ser: 1.11 mg/dL — ABNORMAL HIGH (ref 0.44–1.00)
GFR, Estimated: 46 mL/min — ABNORMAL LOW (ref >60)
Glucose, Bld: 107 mg/dL — ABNORMAL HIGH (ref 70–99)
Potassium: 4.3 mmol/L (ref 3.5–5.1)
Sodium: 131 mmol/L — ABNORMAL LOW (ref 135–145)
Total Bilirubin: 0.4 mg/dL (ref 0.3–1.2)
Total Protein: 5.8 g/dL — ABNORMAL LOW (ref 6.5–8.1)

## 2020-12-29 LAB — MAGNESIUM: Magnesium: 2 mg/dL (ref 1.7–2.4)

## 2020-12-29 LAB — GLUCOSE, CAPILLARY
Glucose-Capillary: 108 mg/dL — ABNORMAL HIGH (ref 70–99)
Glucose-Capillary: 138 mg/dL — ABNORMAL HIGH (ref 70–99)
Glucose-Capillary: 161 mg/dL — ABNORMAL HIGH (ref 70–99)
Glucose-Capillary: 113 mg/dL — ABNORMAL HIGH (ref 70–99)
Glucose-Capillary: 85 mg/dL (ref 70–99)
Glucose-Capillary: 86 mg/dL (ref 70–99)

## 2020-12-29 LAB — PHOSPHORUS: Phosphorus: 2.7 mg/dL (ref 2.5–4.6)

## 2020-12-29 MED ORDER — ISOSORBIDE MONONITRATE ER 30 MG PO TB24: 30.0000 mg | ORAL_TABLET | Freq: Every day | ORAL | Status: DC

## 2020-12-29 MED ORDER — JEVITY 1.5 CAL/FIBER PO LIQD
780.0000 mL | ORAL | Status: DC
  Administered 2020-12-29 – 2020-12-30 (×2): 780 mL
  Filled 2020-12-29 (×3): qty 948

## 2020-12-29 MED ORDER — ACETAMINOPHEN 500 MG PO TABS
1000.0000 mg | ORAL_TABLET | Freq: Four times a day (QID) | ORAL | Status: DC | PRN
  Administered 2020-12-29 (×2): 1000 mg
  Filled 2020-12-29 (×2): qty 2

## 2020-12-29 MED ORDER — GABAPENTIN 300 MG PO CAPS: 300.0000 mg | ORAL_CAPSULE | Freq: Two times a day (BID) | ORAL | Status: DC

## 2020-12-29 MED ORDER — AMOXICILLIN-POT CLAVULANATE 500-125 MG PO TABS
1.0000 | ORAL_TABLET | Freq: Two times a day (BID) | ORAL | Status: DC
  Administered 2020-12-29 – 2020-12-31 (×5): 500 mg via ORAL
  Filled 2020-12-29 (×6): qty 1

## 2020-12-29 MED ORDER — IBUPROFEN 100 MG/5ML PO SUSP
600.0000 mg | Freq: Four times a day (QID) | ORAL | Status: DC | PRN
Start: 2020-12-29 — End: 2020-12-29
  Filled 2020-12-29 (×2): qty 30

## 2020-12-29 MED ORDER — GADOBUTROL 1 MMOL/ML IV SOLN
5.0000 mL | Freq: Once | INTRAVENOUS | Status: AC | PRN
  Administered 2020-12-29: 5 mL via INTRAVENOUS

## 2020-12-29 MED ORDER — GABAPENTIN 300 MG PO CAPS
300.0000 mg | ORAL_CAPSULE | Freq: Two times a day (BID) | ORAL | Status: DC
  Administered 2020-12-29: 300 mg via ORAL
  Filled 2020-12-29: qty 1

## 2020-12-29 NOTE — Progress Notes (Signed)
Glucometer not transferring data. CBG 161, 2 units if insulin given

## 2020-12-29 NOTE — Progress Notes (Addendum)
Nutrition Follow-up  DOCUMENTATION CODES:   Non-severe (moderate) malnutrition in context of chronic illness  INTERVENTION:   Continue nocturnal tube feedings via J-tube: change to Jevity 1.5 at 65 ml/h x 12 hours (780 ml). Provides 1170 kcal, 50 gm protein, 593 ml free water daily.  NUTRITION DIAGNOSIS:   Moderate Malnutrition related to chronic illness (gastric stenosis) as evidenced by mild fat depletion, moderate fat depletion, mild muscle depletion, moderate muscle depletion.  Ongoing   GOAL:   Patient will meet greater than or equal to 90% of their needs  Progressing   MONITOR:   PO intake, Labs, Weight trends, TF tolerance, Skin  REASON FOR ASSESSMENT:   Consult Enteral/tube feeding initiation and management, Wound healing  ASSESSMENT:   87 y.o. female with PMH of DM2, HTN, CKD 4, gastric stenosis s/p feeding J-tube.  She presented to the hospital after an episode of unresponsiveness.  Being treated for osteomyelitis.  Patient is now on a dysphagia 3 diet with thin liquids. She reports that she did not eat any breakfast this morning. She ate yogurt and drank some apple juice for lunch. She is tolerating her nocturnal tube feedings without difficulty.   Nocturnal feedings via J-tube: Osmolite 1.5 at 65 ml/h x 12 hours (780 ml) provides 1170 kcal, 49 gm protein, 594 ml free water daily. This meets 80% of her calorie needs and 70% of her protein needs. Suspect PO intake is meeting the additional calorie and protein needs. Will change formula to Jevity 1.5 d/t low hospital supply of Osmolite 1.5 RTH bottles.   Labs reviewed. Na 131 CBG: [phone]  Medications reviewed and include ferrous sulfate, novolog, MVI with minerals BID, Protonix, Senokot-S. IVF: D5 LR at 50 ml/h  Diet Order:   Diet Order             DIET DYS 3 Room service appropriate? Yes; Fluid consistency: Thin  Diet effective now                   EDUCATION NEEDS:   Education needs have  been addressed  Skin:  Skin Assessment: Skin Integrity Issues: Skin Integrity Issues:: Stage II, Diabetic Ulcer, Other (Comment) Stage II: buttocks Diabetic Ulcer: toe on L foot; R foot Other: non pressure wound R foot  Last BM:  9/27  Height:   Ht Readings from Last 1 Encounters:  12/26/20 5\' 6"  (1.676 m)    Weight:   Wt Readings from Last 1 Encounters:  12/29/20 57.9 kg    Ideal Body Weight:  59.1 kg  BMI:  Body mass index is 20.6 kg/m.  Estimated Nutritional Needs:   Kcal:  1450-1650  Protein:  70-90 gm  Fluid:  > 1.4 L   Lucas Mallow, RD, LDN, CNSC Please refer to Amion for contact information.

## 2020-12-29 NOTE — Plan of Care (Signed)
Final culture results still not available. I think it's reasonable to discharge on Augmentin per ID recs and f/u as outpatient for any changes.  Lanae Crumbly, DPM 12/29/2020

## 2020-12-29 NOTE — Progress Notes (Addendum)
PROGRESS NOTE    [name]  UEA:[id number] DOB: 26-Feb-1924 DOA: 12/21/2020 PCP: Sharon Seller, NP   No chief complaint on file.   Brief Narrative:  Shannon [name] is a 87 y.o. female with medical history significant of DM2, HTN, CKD 4, gastric stenosis s/p feeding tube.  She presented to the hospital after an episode of unresponsiveness.  She was brought to hospital by EMS.  She was complaining of bilateral foot pain.   -Noted to have osteomyelitis of both feet -Seen by vascular surgery, underwent an angiogram, -Subsequently had debridement and bone biopsy, cultures obtained by podiatry -Infectious disease following as well -On 9/27 developed severe headache, generalized malaise  Assessment & Plan:   Severe headache, general malaise Mild encephalopathy -Started acutely on 9/27, afebrile, no leukocytosis, labs and vital signs unremarkable -CT head, abdomen pelvis and urinalysis unremarkable -Will obtain MRI brain with and without contrast -Tylenol, DC imdur  -Supportive care, monitor -Gentle IV fluids for now  -IV cefepime was discontinued in case it could be contributing to above  Bilateral Diabetic Foot Infections  Osteomyelitis MRI R foot notable for acute osteo of 3rd metatarsal, bone marrow edema at resection margin of 5th metatarsal, diffuse soft tissue swelling at the distal stump as well as the dorsum of the forefoot.   MRI L foot notable for dislocation and hallux valgus at the first MTP joint with boy erosion particularaly in the head of the first metatarsal medially, but also in the base of the proximal phalanx, with marrow edema in both of the structures - concerning for osteo and possible septic joint.  Transverse fx of head of 3rd metatarsa, ? Superimposed osteo.   R foot ulcer grew Enterococcus on 9/12  L foot ulcer cx grew pseudomonas 3 months ago -Seen by vascular surgery, underwent right lower extremity angiogram on 9/3,, optimized for debridement, seen by  podiatry underwent irrigation and debridement, foot and bone biopsy on 9/25 -Cultures remain negative -Was on broad-spectrum antibiotics including vancomycin and cefepime till 9/27, ID recommends 4 weeks of Augmentin  PAD S/p L popliteal angioplasty 10/20/20 S/p RLE angiogram 9/23 recommending aspirin indefinitely, plavix indefinitely.  Statin indefinitely.  Vascular c/s, appreciate assistance with PAD and above -> safe to proceed with debridement of foot per podiatry.  AKI on CKD IIIB Baseline creatinine appears to be around 1.45 1.68 at presentation -Improved and stable now  HTN Continue home BP meds  T2DM A1c 5.5 last month Diet controlled Continue BG check  HFpEF Holding lasix given aki on presentation  Gastric stenosis s/p feeding tube Continue tube feeds   DVT prophylaxis: heparin Code Status: DNR Family Communication: No family at bedside, will update daughter Disposition:   Status is: Inpatient  Remains inpatient appropriate because:Inpatient level of care appropriate due to severity of illness  Dispo: The patient is from: Home              Anticipated d/c is to:  pending              Patient currently is not medically stable to d/c.   Difficult to place patient No       Consultants:  ID podiatry  Procedures:  PROCEDURE:   1) US guided left common femoral artery access 2) Right lower extremity angiogram with second order cannulation (35mL total contrast)  Irrigation and debridement, foot and bone biopsy 9/25  Antimicrobials:  Anti-infectives (From admission, onward)    Start     Dose/Rate Route Frequency Ordered  Stop   12/29/20 1100  amoxicillin-clavulanate (AUGMENTIN) 500-125 MG per tablet 500 mg        1 tablet Oral Every 12 hours 12/29/20 1000     12/29/20 0000  piperacillin-tazobactam (ZOSYN) IVPB 3.375 g  Status:  Discontinued        3.375 g 12.5 mL/hr over 240 Minutes Intravenous Every 8 hours 12/28/20 2302 12/29/20 1000   12/28/20 1900   vancomycin (VANCOCIN) IVPB 1000 mg/200 mL premix  Status:  Discontinued        1,000 mg 200 mL/hr over 60 Minutes Intravenous Every 48 hours 12/26/20 1203 12/29/20 1000   12/26/20 1230  ceFEPIme (MAXIPIME) 2 g in sodium chloride 0.9 % 100 mL IVPB  Status:  Discontinued        2 g 200 mL/hr over 30 Minutes Intravenous Every 24 hours 12/26/20 1142 12/28/20 2249   12/26/20 1230  vancomycin (VANCOREADY) IVPB 1250 mg/250 mL        1,250 mg 166.7 mL/hr over 90 Minutes Intravenous  Once 12/26/20 1142 12/26/20 2209   12/21/20 2200  piperacillin-tazobactam (ZOSYN) IVPB 3.375 g  Status:  Discontinued        3.375 g 12.5 mL/hr over 240 Minutes Intravenous Every 12 hours 12/21/20 2105 12/22/20 1157   12/21/20 2030  vancomycin (VANCOREADY) IVPB 1250 mg/250 mL  Status:  Discontinued        1,250 mg 166.7 mL/hr over 90 Minutes Intravenous  Once 12/21/20 2022 12/21/20 2035          Subjective: -Not feeling well, continues to have an bad headache since yesterday, feels restless and anxious, unable to eat  Objective: Vitals:   12/29/20 0029 12/29/20 0558 12/29/20 1014 12/29/20 1334  BP: (!) 165/65 (!) 155/58 (!) 169/57 (!) 136/50  Pulse: 81 72  74  Resp: 17 20  16   Temp: 97.9 F (36.6 C) 98.5 F (36.9 C)  98.6 F (37 C)  TempSrc: Oral Oral  Oral  SpO2: 100% 100%  98%  Weight:  57.9 kg    Height:        Intake/Output Summary (Last 24 hours) at 12/29/2020 1354 Last data filed at 12/29/2020 1206 Gross per 24 hour  Intake 575.75 ml  Output 2725 ml  Net -2149.25 ml   Filed Weights   12/27/20 0419 12/28/20 0430 12/29/20 0558  Weight: 58.9 kg 58.6 kg 57.9 kg    Examination:  General: Awake, alert, uncomfortable appearing, somewhat restless HEENT: Pupils equal and reactive CVS: S1-S2, regular rate rhythm Lungs: Decreased breath sounds to bases Abdomen: Soft, nontender, nondistended, PEG tube noted, reducible soft hernia noted Extremities: Both feet with dressing, no erythema tenderness  or drainage    Data Reviewed: I have personally reviewed following labs and imaging studies  CBC: Recent Labs  Lab 12/26/20 0220 12/27/20 0103 12/28/20 0417 12/28/20 1539 12/29/20 0203  WBC 4.6 4.3 3.9* 5.2 4.5  NEUTROABS 2.8 2.9 2.0 2.9 2.3  HGB 8.9* 8.6* 8.3* 8.7* 8.6*  HCT 26.1* 25.3* 24.5* 25.5* 25.2*  MCV 94.6 95.5 95.0 94.4 94.7  PLT 175 179 177 183 187    Basic Metabolic Panel: Recent Labs  Lab 12/25/20 0210 12/26/20 0220 12/27/20 0103 12/28/20 0417 12/28/20 1539 12/29/20 0203  NA 133* 131* 129* 130* 131* 131*  K 4.1 4.7 4.3 4.4 4.7 4.3  CL 106 102 100 103 103 103  CO2 21* 23 21* 22 24 23   GLUCOSE 154* 87 191* 142* 95 107*  BUN 27* 26* 39* 49* 47*  42*  CREATININE 1.00 1.05* 1.21* 1.15* 1.07* 1.11*  CALCIUM 8.9 9.1 8.6* 8.9 9.1 9.0  MG 1.8 1.8 1.7 1.8  --  2.0  PHOS 2.6 3.6 3.5 3.0  --  2.7    GFR: Estimated Creatinine Clearance: 27.7 mL/min (A) (by C-G formula based on SCr of 1.11 mg/dL (H)).  Liver Function Tests: Recent Labs  Lab 12/26/20 0220 12/27/20 0103 12/28/20 0417 12/28/20 1539 12/29/20 0203  AST 40 57* 58* 47* 38  ALT 25 38 45* 42 36  ALKPHOS 75 78 78 82 80  BILITOT 0.7 0.6 0.4 0.6 0.4  PROT 6.2* 5.9* 5.9* 6.1* 5.8*  ALBUMIN 2.5* 2.4* 2.5* 2.5* 2.4*    CBG: Recent Labs  Lab 12/28/20 2015 12/29/20 0027 12/29/20 0603 12/29/20 0752 12/29/20 1140  GLUCAP 90 108* 138* 161* 113*     Recent Results (from the past 240 hour(s))  Blood culture (routine x 2)     Status: None   Collection Time: 12/21/20 10:29 AM   Specimen: BLOOD  Result Value Ref Range Status   Specimen Description BLOOD RIGHT ANTECUBITAL  Final   Special Requests   Final    BOTTLES DRAWN AEROBIC ONLY Blood Culture results may not be optimal due to an inadequate volume of blood received in culture bottles   Culture   Final    NO GROWTH 5 DAYS Performed at [facility], [address] [place], [place] [address]    Report Status 12/26/2020 FINAL  Final   Blood culture (routine x 2)     Status: None   Collection Time: 12/21/20 10:34 AM   Specimen: BLOOD  Result Value Ref Range Status   Specimen Description BLOOD SITE NOT SPECIFIED  Final   Special Requests   Final    BOTTLES DRAWN AEROBIC ONLY Blood Culture results may not be optimal due to an inadequate volume of blood received in culture bottles   Culture   Final    NO GROWTH 5 DAYS Performed at [facility], [address] [place], [place] [address]    Report Status 12/26/2020 FINAL  Final  Resp Panel by RT-PCR (Flu A&B, Covid) Urine, Clean Catch     Status: None   Collection Time: 12/21/20  8:34 PM   Specimen: Urine, Clean Catch; Nasopharyngeal(NP) swabs in vial transport medium  Result Value Ref Range Status   SARS Coronavirus 2 by RT PCR NEGATIVE NEGATIVE Final    Comment: (NOTE) SARS-CoV-2 target nucleic acids are NOT DETECTED.  The SARS-CoV-2 RNA is generally detectable in upper respiratory specimens during the acute phase of infection. The lowest concentration of SARS-CoV-2 viral copies this assay can detect is 138 copies/mL. A negative result does not preclude SARS-Cov-2 infection and should not be used as the sole basis for treatment or other patient management decisions. A negative result may occur with  improper specimen collection/handling, submission of specimen other than nasopharyngeal swab, presence of viral mutation(s) within the areas targeted by this assay, and inadequate number of viral copies(<138 copies/mL). A negative result must be combined with clinical observations, patient history, and epidemiological information. The expected result is Negative.  Fact Sheet for Patients:  BloggerCourse.com  Fact Sheet for Healthcare Providers:  SeriousBroker.it  This test is no t yet approved or cleared by the [place] FDA and  has been authorized for detection and/or diagnosis of SARS-CoV-2 by FDA  under an Emergency Use Authorization (EUA). This EUA will remain  in effect (meaning this test  can be used) for the duration of the COVID-19 declaration under Section 564(b)(1) of the Act, 21 U.S.C.section 360bbb-3(b)(1), unless the authorization is terminated  or revoked sooner.       Influenza A by PCR NEGATIVE NEGATIVE Final   Influenza B by PCR NEGATIVE NEGATIVE Final    Comment: (NOTE) The Xpert Xpress SARS-CoV-2/FLU/RSV plus assay is intended as an aid in the diagnosis of influenza from Nasopharyngeal swab specimens and should not be used as a sole basis for treatment. Nasal washings and aspirates are unacceptable for Xpert Xpress SARS-CoV-2/FLU/RSV testing.  Fact Sheet for Patients: BloggerCourse.com  Fact Sheet for Healthcare Providers: SeriousBroker.it  This test is not yet approved or cleared by the [place] FDA and has been authorized for detection and/or diagnosis of SARS-CoV-2 by FDA under an Emergency Use Authorization (EUA). This EUA will remain in effect (meaning this test can be used) for the duration of the COVID-19 declaration under Section 564(b)(1) of the Act, 21 U.S.C. section 360bbb-3(b)(1), unless the authorization is terminated or revoked.  Performed at [facility], [address] [place], [place] [address]   MRSA Next Gen by PCR, Nasal     Status: None   Collection Time: 12/22/20  1:00 AM   Specimen: Nasal Mucosa; Nasal Swab  Result Value Ref Range Status   MRSA by PCR Next Gen NOT DETECTED NOT DETECTED Final    Comment: (NOTE) The GeneXpert MRSA Assay (FDA approved for NASAL specimens only), is one component of a comprehensive MRSA colonization surveillance program. It is not intended to diagnose MRSA infection nor to guide or monitor treatment for MRSA infections. Test performance is not FDA approved in patients less than 54 years old. Performed at [facility], [address], [place], [place] [address]   Aerobic/Anaerobic Culture w Gram Stain (surgical/deep wound)     Status: None (Preliminary result)   Collection Time: 12/26/20  8:11 AM   Specimen: PATH Bone biopsy; Tissue  Result Value Ref Range Status   Specimen Description BONE  Final   Special Requests LEFT 1ST METATARSAL BONE FOR CULTURE SPEC A  Final   Gram Stain NO WBC SEEN NO ORGANISMS SEEN   Final   Culture   Final    NO GROWTH 3 DAYS Performed at [facility], [address], [place], [place] [address]    Report Status PENDING  Incomplete  Aerobic/Anaerobic Culture w Gram Stain (surgical/deep wound)     Status: None (Preliminary result)   Collection Time: 12/26/20  8:14 AM   Specimen: PATH Bone biopsy; Tissue  Result Value Ref Range Status   Specimen Description TISSUE  Final   Special Requests RIGHT 3RD METATARSAL BONE FOR CULTURE SPEC B  Final   Gram Stain NO WBC SEEN NO ORGANISMS SEEN   Final   Culture   Final    NO GROWTH 3 DAYS Performed at [facility], [address] [place], [place] [address]    Report Status PENDING  Incomplete  Aerobic/Anaerobic Culture w Gram Stain (surgical/deep wound)     Status: None (Preliminary result)   Collection Time: 12/26/20  8:15 AM   Specimen: PATH Bone biopsy; Tissue  Result Value Ref Range Status   Specimen Description TISSUE  Final   Special Requests RIGHT 5TH METATARSAL BONE FOR CULTURE SPEC C  Final   Gram Stain NO WBC SEEN NO ORGANISMS SEEN   Final   Culture   Final    NO  GROWTH 3 DAYS Performed at [facility], [address] [place], [place] [address]    Report Status PENDING  Incomplete         Radiology Studies: CT HEAD WO CONTRAST ( )  Result Date: 12/28/2020 CLINICAL DATA:  Headache EXAM: CT HEAD WITHOUT CONTRAST TECHNIQUE: Contiguous axial images were obtained from the base of the skull through the vertex without intravenous contrast. COMPARISON:  CT head dated July 07, 2019 FINDINGS: Brain: Old  bilateral cerebellar infarcts. Chronic white matter ischemic change. Mild cerebral atrophy which is likely age related. No evidence of acute infarction, hemorrhage, hydrocephalus, extra-axial collection or mass lesion/mass effect. Vascular: No hyperdense vessel or unexpected calcification. Skull: Normal. Negative for fracture or focal lesion. Sinuses/Orbits: No acute finding. Other: None. IMPRESSION: No acute intracranial abnormality Electronically Signed   By: Allegra Lai M.D.   On: 12/28/2020 13:35   CT ABDOMEN PELVIS W CONTRAST  Result Date: 12/28/2020 CLINICAL DATA:  Abdominal pain, generalized weakness EXAM: CT ABDOMEN AND PELVIS WITH CONTRAST TECHNIQUE: Multidetector CT imaging of the abdomen and pelvis was performed using the standard protocol following bolus administration of intravenous contrast. CONTRAST:  50mL OMNIPAQUE IOHEXOL 350 MG/ML SOLN COMPARISON:  04/22/2020 FINDINGS: Lower chest: No acute pleural or parenchymal lung disease. Hepatobiliary: Numerous hepatic hypodensities are compatible with cysts, stable. The gallbladder is surgically absent. Pancreas: Unremarkable. No pancreatic ductal dilatation or surrounding inflammatory changes. Spleen: Normal in size without focal abnormality. Adrenals/Urinary Tract: Adrenal glands are unremarkable. Kidneys are normal, without renal calculi, focal lesion, or hydronephrosis. Bladder is unremarkable. Stomach/Bowel: No bowel obstruction or ileus. Postsurgical changes are seen from prior sigmoid colon resection and reanastomosis. No bowel wall thickening or inflammatory change. A percutaneous gastrojejunostomy tube is identified. Catheter is coiled within the gastric lumen, tip at the level of the third portion duodenum. Vascular/Lymphatic: Aortic atherosclerosis. No enlarged abdominal or pelvic lymph nodes. Reproductive: Uterus and bilateral adnexa are unremarkable. Other: No free fluid or free gas. There are bilateral paraumbilical ventral hernias.  The right para umbilical hernia contains a portion of the transverse colon. No evidence of incarceration or obstruction. Musculoskeletal: No acute or destructive bony lesions. Reconstructed images demonstrate no additional findings. IMPRESSION: 1. Paraumbilical ventral hernias as above. No evidence of bowel incarceration or obstruction. 2. Percutaneous gastrojejunostomy tube coiled within the gastric lumen, with tip extending only to the third portion duodenum. 3.  Aortic Atherosclerosis (ICD10-I70.0). Electronically Signed   By: Sharlet Salina M.D.   On: 12/28/2020 22:45   DG CHEST PORT 1 VIEW  Result Date: 12/28/2020 CLINICAL DATA:  87 year old female with history of cough. EXAM: PORTABLE CHEST 1 VIEW COMPARISON:  Chest x-ray 12/28/2019. FINDINGS: Lung volumes are low. Opacities at the left base, favored to reflect subsegmental atelectasis or chronic scarring. No definite consolidative airspace disease. No pleural effusions. Mild elevation of the right hemidiaphragm is chronic. No pneumothorax. No evidence of pulmonary edema. Heart size is normal. Upper mediastinal contours are within normal limits. Atherosclerotic calcifications in the thoracic aorta. IMPRESSION: 1. Low lung volumes with subsegmental atelectasis or scarring at the left lung base. 2. Aortic atherosclerosis. Electronically Signed   By: Trudie Reed M.D.   On: 12/28/2020 19:52        Scheduled Meds:  amLODipine  5 mg Oral Daily   amoxicillin-clavulanate  1 tablet Oral Q12H   artificial tears  1 application Both Eyes QHS   aspirin EC  81 mg Oral Daily   atorvastatin  10 mg Oral Daily  clopidogrel  75 mg Oral Daily   cycloSPORINE  1 drop Both Eyes BID   DULoxetine  30 mg Oral Daily   ferrous sulfate  325 mg Per Tube Daily   fluticasone  2 spray Each Nare Daily   free water  180 mL Per Tube TID   [START ON 12/30/2020] gabapentin  300 mg Oral BID   heparin  5,000 Units Subcutaneous Q8H   hydrALAZINE  50 mg Oral TID   insulin  aspart  0-9 Units Subcutaneous TID AC & HS   isosorbide mononitrate  60 mg Oral Daily   latanoprost  1 drop Both Eyes QHS   levothyroxine  125 mcg Oral QAC breakfast   loratadine  10 mg Oral Daily   mometasone-formoterol  2 puff Inhalation BID   multivitamin with minerals  1 tablet Oral Daily   nystatin cream  1 application Topical BID   pantoprazole  40 mg Oral BID   polyethylene glycol  17 g Oral BID   povidone-iodine   Topical Q1400   senna-docusate  1 tablet Oral QHS   sodium chloride flush  3 mL Intravenous Q12H   Continuous Infusions:  sodium chloride Stopped (12/29/20 1200)   dextrose 5% lactated ringers 50 mL/hr at 12/29/20 0049   feeding supplement (OSMOLITE 1.5 CAL) Stopped (12/28/20 2344)     LOS: 8 days    Time spent: over 40 min    Zannie Cove, MD [facility]  12/29/2020, 1:54 PM

## 2020-12-29 NOTE — Care Management Important Message (Signed)
Important Message  Patient Details  Name: Shannon [name] MRN: [id number] Date of Birth: 09/09/1923   Medicare Important Message Given:  Yes     [place], [place] 12/29/2020, 10:53 AM

## 2020-12-29 NOTE — Progress Notes (Signed)
[facility]   Reason for visit: follow up on osteomyelitis  Interval History: some increased confusion yesterday and changed to cefepime.  WBC wnl, remains afebrile.   Day 4 total antibiotics  Physical Exam: Constitutional:  Vitals:   12/29/20 1014 12/29/20 1334  BP: (!) 169/57 (!) 136/50  Pulse:  74  Resp:  16  Temp:  98.6 F (37 C)  SpO2:  98%  She is in nad Respiratory: normal respiratory effort; CTA B Cardiovascular: RRR   Review of Systems: Constitutional: negative for fever and chills Gastrointestinal: negative for nausea and diarrhea  Lab Results  Component Value Date   WBC 4.5 12/29/2020   HGB 8.6 (L) 12/29/2020   HCT 25.2 (L) 12/29/2020   MCV 94.7 12/29/2020   PLT 187 12/29/2020    Lab Results  Component Value Date   CREATININE 1.11 (H) 12/29/2020   BUN 42 (H) 12/29/2020   NA 131 (L) 12/29/2020   K 4.3 12/29/2020   CL 103 12/29/2020   CO2 23 12/29/2020    Lab Results  Component Value Date   ALT 36 12/29/2020   AST 38 12/29/2020   ALKPHOS 80 12/29/2020     Microbiology: Recent Results (from the past 240 hour(s))  Blood culture (routine x 2)     Status: None   Collection Time: 12/21/20 10:29 AM   Specimen: BLOOD  Result Value Ref Range Status   Specimen Description BLOOD RIGHT ANTECUBITAL  Final   Special Requests   Final    BOTTLES DRAWN AEROBIC ONLY Blood Culture results may not be optimal due to an inadequate volume of blood received in culture bottles   Culture   Final    NO GROWTH 5 DAYS Performed at [facility], [place] [address], [place], [place] [address]    Report Status 12/26/2020 FINAL  Final  Blood culture (routine x 2)     Status: None   Collection Time: 12/21/20 10:34 AM   Specimen: BLOOD  Result Value Ref Range Status   Specimen Description BLOOD SITE NOT SPECIFIED  Final   Special Requests   Final    BOTTLES DRAWN AEROBIC ONLY Blood Culture results may not be optimal due to an inadequate  volume of blood received in culture bottles   Culture   Final    NO GROWTH 5 DAYS Performed at [facility], [place] [address], [place], [place] [address]    Report Status 12/26/2020 FINAL  Final  Resp Panel by RT-PCR (Flu A&B, Covid) Urine, Clean Catch     Status: None   Collection Time: 12/21/20  8:34 PM   Specimen: Urine, Clean Catch; Nasopharyngeal(NP) swabs in vial transport medium  Result Value Ref Range Status   SARS Coronavirus 2 by RT PCR NEGATIVE NEGATIVE Final    Comment: (NOTE) SARS-CoV-2 target nucleic acids are NOT DETECTED.  The SARS-CoV-2 RNA is generally detectable in upper respiratory specimens during the acute phase of infection. The lowest concentration of SARS-CoV-2 viral copies this assay can detect is 138 copies/mL. A negative result does not preclude SARS-Cov-2 infection and should not be used as the sole basis for treatment or other patient management decisions. A negative result may occur with  improper specimen collection/handling, submission of specimen other than nasopharyngeal swab, presence of viral mutation(s) within the areas targeted by this assay, and inadequate number of viral copies(<138 copies/mL). A negative result must be combined with clinical observations, patient history, and epidemiological information. The  expected result is Negative.  Fact Sheet for Patients:  EntrepreneurPulse.com.au  Fact Sheet for Healthcare Providers:  IncredibleEmployment.be  This test is no t yet approved or cleared by the [place] FDA and  has been authorized for detection and/or diagnosis of SARS-CoV-2 by FDA under an Emergency Use Authorization (EUA). This EUA will remain  in effect (meaning this test can be used) for the duration of the COVID-19 declaration under Section 564(b)(1) of the Act, 21 U.S.C.section 360bbb-3(b)(1), unless the authorization is terminated  or revoked sooner.       Influenza A by PCR  NEGATIVE NEGATIVE Final   Influenza B by PCR NEGATIVE NEGATIVE Final    Comment: (NOTE) The Xpert Xpress SARS-CoV-2/FLU/RSV plus assay is intended as an aid in the diagnosis of influenza from Nasopharyngeal swab specimens and should not be used as a sole basis for treatment. Nasal washings and aspirates are unacceptable for Xpert Xpress SARS-CoV-2/FLU/RSV testing.  Fact Sheet for Patients: EntrepreneurPulse.com.au  Fact Sheet for Healthcare Providers: IncredibleEmployment.be  This test is not yet approved or cleared by the [place] FDA and has been authorized for detection and/or diagnosis of SARS-CoV-2 by FDA under an Emergency Use Authorization (EUA). This EUA will remain in effect (meaning this test can be used) for the duration of the COVID-19 declaration under Section 564(b)(1) of the Act, 21 U.S.C. section 360bbb-3(b)(1), unless the authorization is terminated or revoked.  Performed at [facility], [place] [address], [place], [place] [address]   MRSA Next Gen by PCR, Nasal     Status: None   Collection Time: 12/22/20  1:00 AM   Specimen: Nasal Mucosa; Nasal Swab  Result Value Ref Range Status   MRSA by PCR Next Gen NOT DETECTED NOT DETECTED Final    Comment: (NOTE) The GeneXpert MRSA Assay (FDA approved for NASAL specimens only), is one component of a comprehensive MRSA colonization surveillance program. It is not intended to diagnose MRSA infection nor to guide or monitor treatment for MRSA infections. Test performance is not FDA approved in patients less than 70 years old. Performed at [facility], [place] [address], [place], [place] [address]   Aerobic/Anaerobic Culture w Gram Stain (surgical/deep wound)     Status: None (Preliminary result)   Collection Time: 12/26/20  8:11 AM   Specimen: PATH Bone biopsy; Tissue  Result Value Ref Range Status   Specimen Description BONE  Final   Special Requests LEFT 1ST  METATARSAL BONE FOR CULTURE SPEC A  Final   Gram Stain NO WBC SEEN NO ORGANISMS SEEN   Final   Culture   Final    NO GROWTH 3 DAYS Performed at [facility], [address] [place], [place] [address]    Report Status PENDING  Incomplete  Aerobic/Anaerobic Culture w Gram Stain (surgical/deep wound)     Status: None (Preliminary result)   Collection Time: 12/26/20  8:14 AM   Specimen: PATH Bone biopsy; Tissue  Result Value Ref Range Status   Specimen Description TISSUE  Final   Special Requests RIGHT 3RD METATARSAL BONE FOR CULTURE SPEC B  Final   Gram Stain NO WBC SEEN NO ORGANISMS SEEN   Final   Culture   Final    NO GROWTH 3 DAYS Performed at [facility], [address], [place], [place] [address]    Report Status PENDING  Incomplete  Aerobic/Anaerobic Culture w Gram Stain (surgical/deep wound)     Status: None (Preliminary result)  Collection Time: 12/26/20  8:15 AM   Specimen: PATH Bone biopsy; Tissue  Result Value Ref Range Status   Specimen Description TISSUE  Final   Special Requests RIGHT 5TH METATARSAL BONE FOR CULTURE SPEC C  Final   Gram Stain NO WBC SEEN NO ORGANISMS SEEN   Final   Culture   Final    NO GROWTH 3 DAYS Performed at [facility], [address], [place], [place] [address]    Report Status PENDING  Incomplete    Impression/Plan:  1. Possible osteomyelitis - right foot third toe with grossly infected appearing toe per Dr. Jacqualyn Posey and otherwise no other concerns.  Previous Enterococcus in swab culture so will have her continue with amoxicillin/sulbactam for 4 weeks with no positive culture growth here.   2.  Confusion - unclear etiology and cefepime possible and has been stopped.  Will continue to monitor.   3. Headache - she did complain of a headache and CT scan without concerns, getting an MRI today.

## 2020-12-29 NOTE — Progress Notes (Signed)
Occupational Therapy Treatment Patient Details Name: Shannon [name] MRN: [id number] DOB: [birth year] Today's Date: 12/29/2020   History of present illness Shannon [name] is a 87 y.o. female with medical history significant of DM2, HTN, CKD 4, gastric stenosis s/p feeding tube.  She presented to the hospital after an episode of unresponsiveness.  She was brought to hospital by EMS.  She was complaining of bilateral foot pain.  Underwent I&D and bone biopsy of feet on 9/25 due to osteomyelitis L foot.   OT comments  Patient supine in bed and reports "Feeling bad" but unable to quantify her unease despite multiple questions posed by therapist. She doesn't report pain when asked but her head and shoulders are restless. Intention of sitting on side of bed derailed when patient saying "I need to spit" and unable to manage her own secretions - she grossly wipes at her mouth and spits in a paper towel without significant success with therapist needing to wipe thick phlegm from her tongue multiple times. Patient able to wipe her face and bring cup to mouth x 1 but needed assistance to take additional sips to steady her hand. Patient continued to be restless and predominantly kept her eyes closed throughout treatment. RN notified of difficulty with secretions. Will continue to follow patient and update POC if needed - as today's treatment limited.     Recommendations for follow up therapy are one component of a multi-disciplinary discharge planning process, led by the attending physician.  Recommendations may be updated based on patient status, additional functional criteria and insurance authorization.    Follow Up Recommendations  Home health OT    Equipment Recommendations  3 in 1 bedside commode    Recommendations for Other Services      Precautions / Restrictions Precautions Precautions: Fall Required Braces or Orthoses: Other Brace Other Brace: bilateral post op shoes Restrictions Weight Bearing  Restrictions: No       Mobility Bed Mobility                    Transfers                      Balance                                           ADL either performed or assessed with clinical judgement   ADL Overall ADL's : Needs assistance/impaired Eating/Feeding: Moderate assistance;Bed level Eating/Feeding Details (indicate cue type and reason): needs assistance to manage secretions. Able to bring cup to mouth x 1 and then needed assistance for second drink. Grooming: Bed level;Set up;Wash/dry face Grooming Details (indicate cue type and reason): washed face.                                     Vision Baseline Vision/History: 1 Wears glasses     Perception     Praxis      Cognition Arousal/Alertness: Awake/alert Behavior During Therapy: WFL for tasks assessed/performed Overall Cognitive Status: Difficult to assess                                 General Comments: limited verbalizations        Exercises  Shoulder Instructions       General Comments      Pertinent Vitals/ Pain       Pain Assessment: Faces Faces Pain Scale: Hurts a little bit Pain Location: head (reports it eased off) Pain Descriptors / Indicators: Grimacing;Restless Pain Intervention(s): Limited activity within patient's tolerance  Home Living                                          Prior Functioning/Environment              Frequency  Min 2X/week        Progress Toward Goals  OT Goals(current goals can now be found in the care plan section)  Progress towards OT goals: OT to reassess next treatment  Acute Rehab OT Goals Patient Stated Goal: to heal feet OT Goal Formulation: With patient Time For Goal Achievement: 01/10/21 Potential to Achieve Goals: Good  Plan Discharge plan remains appropriate    Co-evaluation                 AM-PAC OT "6 Clicks" Daily Activity      Outcome Measure   Help from another person eating meals?: A Lot Help from another person taking care of personal grooming?: A Little Help from another person toileting, which includes using toliet, bedpan, or urinal?: A Lot Help from another person bathing (including washing, rinsing, drying)?: A Lot Help from another person to put on and taking off regular upper body clothing?: A Little Help from another person to put on and taking off regular lower body clothing?: A Lot 6 Click Score: 14    End of Session    OT Visit Diagnosis: Unsteadiness on feet (R26.81);Other abnormalities of gait and mobility (R26.89);Muscle weakness (generalized) (M62.81);Pain   Activity Tolerance Patient limited by fatigue   Patient Left in bed;with call bell/phone within reach   Nurse Communication Other (comment) (needing assistance to manage secretions.)        Time: 6283-1517 OT Time Calculation (min): 10 min  Charges: OT General Charges $OT Visit: 1 Visit OT Treatments $Self Care/Home Management : 8-22 mins  Waldron Session, OTR/L Acute Care Rehab Services  Office [phone] Pager: [phone]   Kelli Churn 12/29/2020, 9:17 AM

## 2020-12-29 NOTE — TOC Initial Note (Signed)
Transition of Care [facility]) - Initial/Assessment Note    Patient Details  Name: Shannon [name] MRN: [id number] Date of Birth: 07-31-1923  Transition of Care [facility] CM/SW Contact:    Gala Lewandowsky, RN Phone Number: 12/29/2020, 5:53 PM  Clinical Narrative:  Case Manager reached out to patient today and attempted to discuss home health services. The patient wasn't feeling well at the time of the call. Later during the day, the staff RN discussed with patient regarding home health services. Patient is agreeable to home health services. Case Manager had spoken to daughter two days prior and she stated her mom can make the decision if an agency needs to come out. Case Manager made a referral with [facility] for Physical Therapy and Registered Nurse. Case Manager will follow up in am regarding additional transition of care needs.                  Expected Discharge Plan: Home w Home Health Services Barriers to Discharge: No Barriers Identified   Patient Goals and CMS Choice Patient states their goals for this hospitalization and ongoing recovery are:: to return home with family support   Choice offered to / list presented to : Patient  Expected Discharge Plan and Services Expected Discharge Plan: Home w Home Health Services   Discharge Planning Services: CM Consult Post Acute Care Choice: Home Health Living arrangements for the past 2 months: Single Family Home                   DME Agency: NA       HH Arranged: RN, Disease Management, PT HH Agency: [facility] Date [facility] Agency Contacted: 12/29/20 Time HH Agency Contacted: 1500 Representative spoke with at [facility] Agency: Eber Jones  Prior Living Arrangements/Services Living arrangements for the past 2 months: Single Family Home Lives with:: Adult Children Patient language and need for interpreter reviewed:: Yes Do you feel safe going back to the place where you live?: Yes      Need for Family Participation in  Patient Care: Yes (Comment) Care giver support system in place?: Yes (comment)   Criminal Activity/Legal Involvement Pertinent to Current Situation/Hospitalization: No - Comment as needed  Activities of Daily Living      Permission Sought/Granted Permission sought to share information with : Case Manager, Magazine features editor, Family Supports       Permission granted to share info w AGENCY: [facility]        Emotional Assessment       Orientation: : Oriented to Self, Oriented to Place, Oriented to  Time, Oriented to Situation Alcohol / Substance Use: Not Applicable Psych Involvement: No (comment)  Admission diagnosis:  Acute encephalopathy [G93.40] Other osteomyelitis of foot, unspecified laterality (HCC) [N56.2Z3] Patient Active Problem List   Diagnosis Date Noted   Osteomyelitis of right foot (HCC) 12/21/2020   Medication monitoring encounter 12/21/2020   Diabetic infection of right foot (HCC) 12/21/2020   Diabetic foot infection (HCC) 12/21/2020   Osteomyelitis of left foot (HCC) 12/21/2020   UGI bleed 10/11/2020   Depression, major, single episode, complete remission (HCC) 06/09/2020   History of transmetatarsal amputation of right foot (HCC) 11/14/2019   Chest pain 08/27/2019   Lab test positive for detection of COVID-19 virus 08/27/2019   AMS (altered mental status) 07/07/2019   Varicose veins of right lower extremity with ulcer other part of foot (HCC) 06/11/2019   Jejunostomy tube fell out 03/02/2019   S/P amputation of lesser  toe, left (HCC) 02/13/2019   BRBPR (bright red blood per rectum) 01/10/2019   GI bleed 12/25/2018   Bradycardia 11/25/2018   Amputated great toe of right foot (HCC) 05/06/2018   Peripheral vascular disease (HCC) 05/03/2018   Localized, primary osteoarthritis of shoulder region 01/21/2018   PAD (peripheral artery disease) (HCC) 06/27/2017   AKI (acute kidney injury) (HCC) 02/20/2017   Complication of feeding tube (HCC)  02/20/2017   PEG tube malfunction (HCC)    COPD with asthma (HCC) 10/03/2016   Chronic deep vein thrombosis (DVT) of tibial vein of left lower extremity (HCC) 10/03/2016   Gangrene of toe of left foot (HCC) 09/15/2016   Recurrent aspiration pneumonia (HCC)    Atherosclerosis of native arteries of the extremities with gangrene (HCC) 07/28/2016   Other specified hypothyroidism    CHF (NYHA class IV, ACC/AHA stage D) (HCC)    Shortness of breath    Pressure injury of skin 03/11/2016   PEG (percutaneous endoscopic gastrostomy) status (HCC) 03/10/2016   Encounter for therapeutic drug monitoring 02/16/2016   Warfarin anticoagulation    Chronic seasonal allergic rhinitis    Atrial fibrillation and flutter (HCC) 01/24/2016   Chronic diastolic CHF (congestive heart failure) (HCC) 01/24/2016   Bilateral leg edema 12/29/2015   Hypothyroidism due to medication    Weakness    Pyloric stenosis    Pressure ulcer 09/15/2015   PVD (peripheral vascular disease) (HCC)    Diabetic ulcer of toe of right foot associated with type 2 diabetes mellitus, with necrosis of muscle (HCC)    Diverticulitis of large intestine without perforation or abscess without bleeding    Colitis 08/26/2015   Esophageal candidiasis (HCC)    SOB (shortness of breath)    Acute renal failure superimposed on stage 4 chronic kidney disease (HCC)    Encounter for long-term (current) use of other high-risk medications    Dysphagia 07/05/2015   Asthma with allergic rhinitis 06/04/2015   Hypertensive heart disease 03/18/2015   Malnutrition of moderate degree 01/28/2015   Syncope 01/27/2015   Symptomatic bradycardia 01/21/2015   Hypotension due to drugs 01/21/2015   Protein-calorie malnutrition, severe 01/12/2015   Hypertension associated with diabetes (HCC) 01/12/2015   Cerebrovascular disease 07/14/2014   Diabetic polyneuropathy associated with type 2 diabetes mellitus (HCC) 07/14/2014   Hyperlipidemia 07/14/2014   Gastric  outlet obstruction 05/21/2014   Unstable gait 04/29/2014   Slurred speech    Gastroparesis    Constipation 02/03/2014   Carotid stenosis 02/03/2014   Demand ischemia (HCC) 01/21/2014   Gastric bezoar 01/20/2014   Anemia of chronic disease 01/18/2014   CKD (chronic kidney disease) stage 3, GFR 30-59 ml/min (HCC) 01/18/2014   Depression 09/29/2013   Essential hypertension, benign 09/22/2013   GERD (gastroesophageal reflux disease) 09/22/2013   Hyperlipidemia associated with type 2 diabetes mellitus (HCC) 09/22/2013   Type 2 diabetes mellitus with chronic kidney disease (HCC) 09/22/2013   Persistent atrial fibrillation 09/22/2013   History of completed stroke 09/22/2013   Diabetic neuropathy (HCC) 09/22/2013   Hypothyroidism 09/22/2013   Chronic gastrojejunal ulcer with perforation (HCC) 09/07/2013   Accelerated hypertension 09/07/2013   Other specified abnormal immunological findings in serum 09/07/2013   Red blood cell antibody positive 09/07/2013   PCP:  Sharon Seller, NP Pharmacy:   [id number] - [address] [place] - [address] [place] [address] Phone: [phone] Fax: [phone]  [facility] - [place], [place] - [address]  [place] [address] Phone: [phone] Fax: [phone]  [facility] [address] [place] [address] Phone: [phone] Fax: [phone]   Readmission Risk Interventions Readmission Risk Prevention Plan 10/22/2020  Transportation Screening Complete  Medication Review (RN Care Manager) Complete  HRI or Home Care Consult Complete  SW Recovery Care/Counseling Consult Complete  Palliative Care Screening Not Applicable  Skilled Nursing Facility Not Applicable  Some recent data might be hidden

## 2020-12-29 NOTE — Progress Notes (Signed)
Physical Therapy Treatment Patient Details Name: Shannon [name] MRN: [id number] DOB: [birth year] Today's Date: 12/29/2020   History of Present Illness Shannon [name] is a 87 y.o. female with medical history significant of DM2, HTN, CKD 4, gastric stenosis s/p feeding tube.  She presented to the hospital after an episode of unresponsiveness.  She was brought to hospital by EMS.  She was complaining of bilateral foot pain.  Underwent I&D and bone biopsy of feet on 9/25 due to osteomyelitis L foot.    PT Comments    Pt supine in bed with daughter present. Pt agreeable to work with therapy, but unsure if she can walk, as her head and neck are hurting.Dressing on R foot had been taken down and left unwrapped. PT rewrapped prior to bed mobility. Pt able to come to seated EoB, and therapist getting ready to place post op shoes for ambulation when transport came to take pt to MRI. Pt requires modA for return to bed. D/c plans remain appropriate at this time, will reassess in next session.    Recommendations for follow up therapy are one component of a multi-disciplinary discharge planning process, led by the attending physician.  Recommendations may be updated based on patient status, additional functional criteria and insurance authorization.  Follow Up Recommendations  Home health PT;Supervision for mobility/OOB     Equipment Recommendations  None recommended by PT       Precautions / Restrictions Precautions Precautions: Fall Required Braces or Orthoses: Other Brace Other Brace: bilateral post op shoes Restrictions Weight Bearing Restrictions: No     Mobility  Bed Mobility Overal bed mobility: Needs Assistance Bed Mobility: Supine to Sit;Sit to Supine     Supine to sit: Min assist;HOB elevated Sit to supine: Mod assist   General bed mobility comments: min assist for scooting to EOB, modA for returning LE to bed                            Balance Overall balance  assessment: Needs assistance   Sitting balance-Leahy Scale: Good                                      Cognition Arousal/Alertness: Awake/alert Behavior During Therapy: WFL for tasks assessed/performed Overall Cognitive Status: History of cognitive impairments - at baseline                                 General Comments: slow processing along with [facility]         General Comments General comments (skin integrity, edema, etc.): VSS on RA, daughter in room. Dressing on L foot clean, dry and intact, dressing on R foot had been taken down by the doctor, minimal drainage noted, dressing rewrapped and ACE bandage replaced.      Pertinent Vitals/Pain Pain Assessment: Faces Faces Pain Scale: Hurts little more Pain Location: L UE from elbow up to neck and headache Pain Descriptors / Indicators: Aching;Sore Pain Intervention(s): Limited activity within patient's tolerance;Monitored during session;Repositioned     PT Goals (current goals can now be found in the care plan section) Acute Rehab PT Goals Patient Stated Goal: to heal feet PT Goal Formulation: With patient/family Time For Goal Achievement: 01/10/21 Potential to Achieve Goals: Good Progress towards PT goals: Progressing toward goals (unable to fully  assess as transport came for MRI)    Frequency    Min 3X/week      PT Plan Current plan remains appropriate       AM-PAC PT "6 Clicks" Mobility   Outcome Measure  Help needed turning from your back to your side while in a flat bed without using bedrails?: A Little Help needed moving from lying on your back to sitting on the side of a flat bed without using bedrails?: A Little Help needed moving to and from a bed to a chair (including a wheelchair)?: A Little Help needed standing up from a chair using your arms (e.g., wheelchair or bedside chair)?: A Lot Help needed to walk in hospital room?: A Little Help needed climbing 3-5 steps with a  railing? : A Lot 6 Click Score: 16    End of Session Equipment Utilized During Treatment: Gait belt Activity Tolerance: Patient limited by pain Patient left: in chair;with call bell/phone within reach;with family/visitor present Nurse Communication: Mobility status PT Visit Diagnosis: Other abnormalities of gait and mobility (R26.89);Difficulty in walking, not elsewhere classified (R26.2)     Time: 5621-3086 PT Time Calculation (min) (ACUTE ONLY): 21 min  Charges:  $Therapeutic Activity: 8-22 mins                     Saydi Kobel B. Beverely Risen PT, DPT Acute Rehabilitation Services Pager [phone] Office [phone]    [facility] 12/29/2020, 2:39 PM

## 2020-12-30 ENCOUNTER — Ambulatory Visit: Payer: Medicare Other | Admitting: Infectious Diseases

## 2020-12-30 LAB — CBC
HCT: 26.6 % — ABNORMAL LOW (ref 36.0–46.0)
Hemoglobin: 9.2 g/dL — ABNORMAL LOW (ref 12.0–15.0)
MCH: 32.9 pg (ref 26.0–34.0)
MCHC: 34.6 g/dL (ref 30.0–36.0)
MCV: 95 fL (ref 80.0–100.0)
Platelets: 200 10*3/uL (ref 150–400)
RBC: 2.8 MIL/uL — ABNORMAL LOW (ref 3.87–5.11)
RDW: 17.3 % — ABNORMAL HIGH (ref 11.5–15.5)
WBC: 4.4 10*3/uL (ref 4.0–10.5)
nRBC: 0 % (ref 0.0–0.2)

## 2020-12-30 LAB — BASIC METABOLIC PANEL
Anion gap: 7 (ref 5–15)
BUN: 35 mg/dL — ABNORMAL HIGH (ref 8–23)
CO2: 23 mmol/L (ref 22–32)
Calcium: 9.3 mg/dL (ref 8.9–10.3)
Chloride: 103 mmol/L (ref 98–111)
Creatinine, Ser: 1.13 mg/dL — ABNORMAL HIGH (ref 0.44–1.00)
GFR, Estimated: 45 mL/min — ABNORMAL LOW (ref >60)
Glucose, Bld: 121 mg/dL — ABNORMAL HIGH (ref 70–99)
Potassium: 4.4 mmol/L (ref 3.5–5.1)
Sodium: 133 mmol/L — ABNORMAL LOW (ref 135–145)

## 2020-12-30 LAB — GLUCOSE, CAPILLARY
Glucose-Capillary: 104 mg/dL — ABNORMAL HIGH (ref 70–99)
Glucose-Capillary: 111 mg/dL — ABNORMAL HIGH (ref 70–99)
Glucose-Capillary: 138 mg/dL — ABNORMAL HIGH (ref 70–99)
Glucose-Capillary: 154 mg/dL — ABNORMAL HIGH (ref 70–99)
Glucose-Capillary: 87 mg/dL (ref 70–99)

## 2020-12-30 MED ORDER — GABAPENTIN 250 MG/5ML PO SOLN
300.0000 mg | Freq: Two times a day (BID) | ORAL | Status: DC
  Administered 2020-12-30 – 2020-12-31 (×3): 300 mg via ORAL
  Filled 2020-12-30 (×5): qty 6

## 2020-12-30 MED ORDER — FERROUS SULFATE 300 (60 FE) MG/5ML PO SYRP
300.0000 mg | ORAL_SOLUTION | Freq: Two times a day (BID) | ORAL | Status: DC
  Administered 2020-12-30 – 2020-12-31 (×2): 300 mg
  Filled 2020-12-30 (×3): qty 5

## 2020-12-30 MED ORDER — FERROUS SULFATE 300 (60 FE) MG/5ML PO SYRP
300.0000 mg | ORAL_SOLUTION | Freq: Every day | ORAL | Status: DC
  Filled 2020-12-30: qty 5

## 2020-12-30 NOTE — Progress Notes (Signed)
Subjective:  Patient ID: Shannon [name], female    DOB: Aug 13, 1923,  MRN: [id number]  He is doing well says her feet are more painful Objective:   Vitals:   12/30/20 1100 12/30/20 1635  BP:  (!) 175/66  Pulse:    Resp:    Temp:    SpO2: 100%    General AA&O x3. Normal mood and affect.  Vascular Weak pulses toes warm to touch  Neurologic Epicritic sensation grossly reduced.  Dermatologic Dressing clean dry and intact wound stable with granular wound beds  Orthopedic: MMT 5/5   On culture still with no growth Assessment & Plan:  Patient was evaluated and treated and all questions answered.  Bilateral foot ulcerations concerning for osteomyelitis -So far no growth on wound cultures.  Per ID 4 weeks of Augmentin.  Stable for discharge from our standpoint -Infectious diseases following and assistance appreciated in managing her antibiotics -Every other day dressing changes with silver alginate dressings and dry sterile gauze.  Continue this through discharge   Edwin Cap, DPM  Accessible via secure chat for questions or concerns.

## 2020-12-30 NOTE — Progress Notes (Signed)
PROGRESS NOTE    Shannon [name]  UXL:[id number] DOB: 23-Sep-1923 DOA: 12/21/2020 PCP: Sharon Seller, NP   No chief complaint on file.   Brief Narrative:  Shannon [name] is a 87 y.o. female with medical history significant of DM2, HTN, CKD 4, gastric stenosis s/p feeding tube.  She presented to the hospital after an episode of unresponsiveness.  She was brought to hospital by EMS.  She was complaining of bilateral foot pain.   -Noted to have osteomyelitis of both feet -Seen by vascular surgery, underwent an angiogram, -Subsequently had debridement and bone biopsy, cultures obtained by podiatry -Infectious disease following as well -On 9/27 developed severe headache, generalized malaise  Assessment & Plan:   Headache, general malaise Mild encephalopathy -Started acutely on 9/27, afebrile, no leukocytosis, labs and vital signs unremarkable -CT head, abdomen pelvis and urinalysis unremarkable -Suspect this is multifactorial, cefepime, Imdur likely contributing, symptoms have resolved after discontinuing these meds -MRI brain obtained yesterday, notes 1.7 cm meningioma on the right, slightly larger than few years ago, do not suspect this was contributing to her symptoms as they appear to have resolved now, given advanced age and multiple comorbidities, she is not a candidate for meningioma resection any way, discussed with daughter -Improved, continue supportive care -Discharge planning  Bilateral Diabetic Foot Infections  Osteomyelitis MRI R foot notable for acute osteo of 3rd metatarsal, bone marrow edema at resection margin of 5th metatarsal, diffuse soft tissue swelling at the distal stump as well as the dorsum of the forefoot.   MRI L foot notable for dislocation and hallux valgus at the first MTP joint with boy erosion particularaly in the head of the first metatarsal medially, but also in the base of the proximal phalanx, with marrow edema in both of the structures - concerning for  osteo and possible septic joint.  Transverse fx of head of 3rd metatarsa, ? Superimposed osteo.   R foot ulcer grew Enterococcus on 9/12  L foot ulcer cx grew pseudomonas 3 months ago -Seen by vascular surgery, underwent right lower extremity angiogram on 9/3,, optimized for debridement, seen by podiatry underwent irrigation and debridement, foot and bone biopsy on 9/25 -Cultures remain negative -Was on broad-spectrum antibiotics including vancomycin and cefepime till 9/27, ID recommends 4 weeks of Augmentin, this was started 9/28  PAD S/p L popliteal angioplasty 10/20/20 S/p RLE angiogram 9/23 recommending aspirin indefinitely, plavix indefinitely.  Statin indefinitely.  Vascular c/s, appreciate assistance, previously underwent angiogram and optimized  AKI on CKD IIIB Baseline creatinine appears to be around 1.45 1.68 at presentation -Improved and stable now  HTN Continue home BP meds  T2DM A1c 5.5 last month Diet controlled Continue BG check  Chronic diastolic CHF -Euvolemic Holding lasix given aki on presentation  Gastric stenosis s/p feeding tube Continue tube feeds   DVT prophylaxis: heparin Code Status: DNR Family Communication: No family at bedside, updated daughter via phone Disposition:   Status is: Inpatient  Remains inpatient appropriate because:Inpatient level of care appropriate due to severity of illness  Dispo: The patient is from: Home              Anticipated d/c is to:  pending Home in 1 to 2 days              Patient currently is not medically stable to d/c.   Difficult to place patient No       Consultants:  ID podiatry  Procedures:  PROCEDURE:   1) US guided left  common femoral artery access 2) Right lower extremity angiogram with second order cannulation (35mL total contrast)  Irrigation and debridement, foot and bone biopsy 9/25  Antimicrobials:  Anti-infectives (From admission, onward)    Start     Dose/Rate Route Frequency  Ordered Stop   12/29/20 1100  amoxicillin-clavulanate (AUGMENTIN) 500-125 MG per tablet 500 mg        1 tablet Oral Every 12 hours 12/29/20 1000     12/29/20 0000  piperacillin-tazobactam (ZOSYN) IVPB 3.375 g  Status:  Discontinued        3.375 g 12.5 mL/hr over 240 Minutes Intravenous Every 8 hours 12/28/20 2302 12/29/20 1000   12/28/20 1900  vancomycin (VANCOCIN) IVPB 1000 mg/200 mL premix  Status:  Discontinued        1,000 mg 200 mL/hr over 60 Minutes Intravenous Every 48 hours 12/26/20 1203 12/29/20 1000   12/26/20 1230  ceFEPIme (MAXIPIME) 2 g in sodium chloride 0.9 % 100 mL IVPB  Status:  Discontinued        2 g 200 mL/hr over 30 Minutes Intravenous Every 24 hours 12/26/20 1142 12/28/20 2249   12/26/20 1230  vancomycin (VANCOREADY) IVPB 1250 mg/250 mL        1,250 mg 166.7 mL/hr over 90 Minutes Intravenous  Once 12/26/20 1142 12/26/20 2209   12/21/20 2200  piperacillin-tazobactam (ZOSYN) IVPB 3.375 g  Status:  Discontinued        3.375 g 12.5 mL/hr over 240 Minutes Intravenous Every 12 hours 12/21/20 2105 12/22/20 1157   12/21/20 2030  vancomycin (VANCOREADY) IVPB 1250 mg/250 mL  Status:  Discontinued        1,250 mg 166.7 mL/hr over 90 Minutes Intravenous  Once 12/21/20 2022 12/21/20 2035          Subjective: -Feels better today, denies any headache, eating breakfast  Objective: Vitals:   12/30/20 0006 12/30/20 0500 12/30/20 1043 12/30/20 1100  BP: (!) 148/52  (!) 184/71   Pulse: 77     Resp: 19     Temp: 98 F (36.7 C)     TempSrc: Oral     SpO2: 100%   100%  Weight:  57.8 kg    Height:        Intake/Output Summary (Last 24 hours) at 12/30/2020 1207 Last data filed at 12/30/2020 1111 Gross per 24 hour  Intake 1287.36 ml  Output 1350 ml  Net -62.64 ml   Filed Weights   12/28/20 0430 12/29/20 0558 12/30/20 0500  Weight: 58.6 kg 57.9 kg 57.8 kg    Examination:  General: Awake alert pleasant female sitting up in bed, AAOx3, no distress HEENT: Pupils equal  and reactive CVS: S1-S2, regular rate rhythm Lungs: Decreased breath sounds to bases Abdomen: Soft, nontender, nondistended, PEG tube noted, reducible soft hernia noted Extremities: Both feet with dressing, no erythema tenderness or drainage    Data Reviewed: I have personally reviewed following labs and imaging studies  CBC: Recent Labs  Lab 12/26/20 0220 12/27/20 0103 12/28/20 0417 12/28/20 1539 12/29/20 0203 12/30/20 0233  WBC 4.6 4.3 3.9* 5.2 4.5 4.4  NEUTROABS 2.8 2.9 2.0 2.9 2.3  --   HGB 8.9* 8.6* 8.3* 8.7* 8.6* 9.2*  HCT 26.1* 25.3* 24.5* 25.5* 25.2* 26.6*  MCV 94.6 95.5 95.0 94.4 94.7 95.0  PLT 175 179 177 183 187 200    Basic Metabolic Panel: Recent Labs  Lab 12/25/20 0210 12/26/20 0220 12/27/20 0103 12/28/20 0417 12/28/20 1539 12/29/20 0203 12/30/20 0233  NA 133* 131*  129* 130* 131* 131* 133*  K 4.1 4.7 4.3 4.4 4.7 4.3 4.4  CL 106 102 100 103 103 103 103  CO2 21* 23 21* 22 24 23 23   GLUCOSE 154* 87 191* 142* 95 107* 121*  BUN 27* 26* 39* 49* 47* 42* 35*  CREATININE 1.00 1.05* 1.21* 1.15* 1.07* 1.11* 1.13*  CALCIUM 8.9 9.1 8.6* 8.9 9.1 9.0 9.3  MG 1.8 1.8 1.7 1.8  --  2.0  --   PHOS 2.6 3.6 3.5 3.0  --  2.7  --     GFR: Estimated Creatinine Clearance: 27.2 mL/min (A) (by C-G formula based on SCr of 1.13 mg/dL (H)).  Liver Function Tests: Recent Labs  Lab 12/26/20 0220 12/27/20 0103 12/28/20 0417 12/28/20 1539 12/29/20 0203  AST 40 57* 58* 47* 38  ALT 25 38 45* 42 36  ALKPHOS 75 78 78 82 80  BILITOT 0.7 0.6 0.4 0.6 0.4  PROT 6.2* 5.9* 5.9* 6.1* 5.8*  ALBUMIN 2.5* 2.4* 2.5* 2.5* 2.4*    CBG: Recent Labs  Lab 12/29/20 1624 12/29/20 2149 12/30/20 0004 12/30/20 0729 12/30/20 1131  GLUCAP 85 86 138* 154* 111*     Recent Results (from the past 240 hour(s))  Blood culture (routine x 2)     Status: None   Collection Time: 12/21/20 10:29 AM   Specimen: BLOOD  Result Value Ref Range Status   Specimen Description BLOOD RIGHT  ANTECUBITAL  Final   Special Requests   Final    BOTTLES DRAWN AEROBIC ONLY Blood Culture results may not be optimal due to an inadequate volume of blood received in culture bottles   Culture   Final    NO GROWTH 5 DAYS Performed at [facility], [address], [place], [place] [address]    Report Status 12/26/2020 FINAL  Final  Blood culture (routine x 2)     Status: None   Collection Time: 12/21/20 10:34 AM   Specimen: BLOOD  Result Value Ref Range Status   Specimen Description BLOOD SITE NOT SPECIFIED  Final   Special Requests   Final    BOTTLES DRAWN AEROBIC ONLY Blood Culture results may not be optimal due to an inadequate volume of blood received in culture bottles   Culture   Final    NO GROWTH 5 DAYS Performed at [facility], [address], [place], [place] [address]    Report Status 12/26/2020 FINAL  Final  Resp Panel by RT-PCR (Flu A&B, Covid) Urine, Clean Catch     Status: None   Collection Time: 12/21/20  8:34 PM   Specimen: Urine, Clean Catch; Nasopharyngeal(NP) swabs in vial transport medium  Result Value Ref Range Status   SARS Coronavirus 2 by RT PCR NEGATIVE NEGATIVE Final    Comment: (NOTE) SARS-CoV-2 target nucleic acids are NOT DETECTED.  The SARS-CoV-2 RNA is generally detectable in upper respiratory specimens during the acute phase of infection. The lowest concentration of SARS-CoV-2 viral copies this assay can detect is 138 copies/mL. A negative result does not preclude SARS-Cov-2 infection and should not be used as the sole basis for treatment or other patient management decisions. A negative result may occur with  improper specimen collection/handling, submission of specimen other than nasopharyngeal swab, presence of viral mutation(s) within the areas targeted by this assay, and inadequate number of viral copies(<138 copies/mL). A negative result must be combined with clinical observations, patient history, and  epidemiological information. The expected result is Negative.  Fact  Sheet for Patients:  BloggerCourse.com  Fact Sheet for Healthcare Providers:  SeriousBroker.it  This test is no t yet approved or cleared by the [place] FDA and  has been authorized for detection and/or diagnosis of SARS-CoV-2 by FDA under an Emergency Use Authorization (EUA). This EUA will remain  in effect (meaning this test can be used) for the duration of the COVID-19 declaration under Section 564(b)(1) of the Act, 21 U.S.C.section 360bbb-3(b)(1), unless the authorization is terminated  or revoked sooner.       Influenza A by PCR NEGATIVE NEGATIVE Final   Influenza B by PCR NEGATIVE NEGATIVE Final    Comment: (NOTE) The Xpert Xpress SARS-CoV-2/FLU/RSV plus assay is intended as an aid in the diagnosis of influenza from Nasopharyngeal swab specimens and should not be used as a sole basis for treatment. Nasal washings and aspirates are unacceptable for Xpert Xpress SARS-CoV-2/FLU/RSV testing.  Fact Sheet for Patients: BloggerCourse.com  Fact Sheet for Healthcare Providers: SeriousBroker.it  This test is not yet approved or cleared by the [place] FDA and has been authorized for detection and/or diagnosis of SARS-CoV-2 by FDA under an Emergency Use Authorization (EUA). This EUA will remain in effect (meaning this test can be used) for the duration of the COVID-19 declaration under Section 564(b)(1) of the Act, 21 U.S.C. section 360bbb-3(b)(1), unless the authorization is terminated or revoked.  Performed at [facility], [address], [place], [place] [address]   MRSA Next Gen by PCR, Nasal     Status: None   Collection Time: 12/22/20  1:00 AM   Specimen: Nasal Mucosa; Nasal Swab  Result Value Ref Range Status   MRSA by PCR Next Gen NOT DETECTED NOT DETECTED Final    Comment:  (NOTE) The GeneXpert MRSA Assay (FDA approved for NASAL specimens only), is one component of a comprehensive MRSA colonization surveillance program. It is not intended to diagnose MRSA infection nor to guide or monitor treatment for MRSA infections. Test performance is not FDA approved in patients less than 109 years old. Performed at [facility], [address], [place], [place] [address]   Aerobic/Anaerobic Culture w Gram Stain (surgical/deep wound)     Status: None (Preliminary result)   Collection Time: 12/26/20  8:11 AM   Specimen: PATH Bone biopsy; Tissue  Result Value Ref Range Status   Specimen Description BONE  Final   Special Requests LEFT 1ST METATARSAL BONE FOR CULTURE SPEC A  Final   Gram Stain NO WBC SEEN NO ORGANISMS SEEN   Final   Culture   Final    NO GROWTH 4 DAYS Performed at [facility], [address], [place], [place] [address]    Report Status PENDING  Incomplete  Aerobic/Anaerobic Culture w Gram Stain (surgical/deep wound)     Status: None (Preliminary result)   Collection Time: 12/26/20  8:14 AM   Specimen: PATH Bone biopsy; Tissue  Result Value Ref Range Status   Specimen Description TISSUE  Final   Special Requests RIGHT 3RD METATARSAL BONE FOR CULTURE SPEC B  Final   Gram Stain NO WBC SEEN NO ORGANISMS SEEN   Final   Culture   Final    NO GROWTH 4 DAYS Performed at [facility], [address] [place], [place] [address]    Report Status PENDING  Incomplete  Aerobic/Anaerobic Culture w Gram Stain (surgical/deep wound)     Status: None (Preliminary result)   Collection Time: 12/26/20  8:15 AM  Specimen: PATH Bone biopsy; Tissue  Result Value Ref Range Status   Specimen Description TISSUE  Final   Special Requests RIGHT 5TH METATARSAL BONE FOR CULTURE SPEC C  Final   Gram Stain NO WBC SEEN NO ORGANISMS SEEN   Final   Culture   Final    NO GROWTH 4 DAYS Performed at [facility], [address], [place], [place]  [address]    Report Status PENDING  Incomplete         Radiology Studies: CT HEAD WO CONTRAST ( )  Result Date: 12/28/2020 CLINICAL DATA:  Headache EXAM: CT HEAD WITHOUT CONTRAST TECHNIQUE: Contiguous axial images were obtained from the base of the skull through the vertex without intravenous contrast. COMPARISON:  CT head dated July 07, 2019 FINDINGS: Brain: Old bilateral cerebellar infarcts. Chronic white matter ischemic change. Mild cerebral atrophy which is likely age related. No evidence of acute infarction, hemorrhage, hydrocephalus, extra-axial collection or mass lesion/mass effect. Vascular: No hyperdense vessel or unexpected calcification. Skull: Normal. Negative for fracture or focal lesion. Sinuses/Orbits: No acute finding. Other: None. IMPRESSION: No acute intracranial abnormality Electronically Signed   By: Allegra Lai M.D.   On: 12/28/2020 13:35   MR BRAIN W WO CONTRAST  Result Date: 12/29/2020 CLINICAL DATA:  Headache.  Altered mental status. EXAM: MRI HEAD WITHOUT AND WITH CONTRAST TECHNIQUE: Multiplanar, multiecho pulse sequences of the brain and surrounding structures were obtained without and with intravenous contrast. CONTRAST:  5mL GADAVIST GADOBUTROL 1 MMOL/ML IV SOLN COMPARISON:  CT head 12/28/2020.  MRI head 07/07/2019 FINDINGS: Brain: Negative for acute infarct. Generalized atrophy without hydrocephalus. Chronic microvascular ischemic changes in the white matter bilaterally. Chronic infarcts in the cerebellum bilaterally. Negative for hemorrhage Dural-based enhancing mass right frontal convexity measuring 10 x 17 mm. This appears to have enlarged since the prior MRI 2021 when the lesion was difficult to identify however the prior study was not done with contrast and is degraded by motion. Vascular: Normal arterial flow voids at the skull base Skull and upper cervical spine: Cervical spondylosis. Prominent degenerative change pannus at C1-2 causing spinal stenosis.  Sinuses/Orbits: Paranasal sinuses clear. Bilateral cataract extraction Other: None IMPRESSION: Negative for acute infarct Atrophy and chronic ischemic change Right frontal dural-based mass compatible with meningioma 17 x 10 mm. This appears to have enlarged since the MRI of 07/07/2019 Electronically Signed   By: Marlan Palau M.D.   On: 12/29/2020 17:42   CT ABDOMEN PELVIS W CONTRAST  Result Date: 12/28/2020 CLINICAL DATA:  Abdominal pain, generalized weakness EXAM: CT ABDOMEN AND PELVIS WITH CONTRAST TECHNIQUE: Multidetector CT imaging of the abdomen and pelvis was performed using the standard protocol following bolus administration of intravenous contrast. CONTRAST:  50mL OMNIPAQUE IOHEXOL 350 MG/ML SOLN COMPARISON:  04/22/2020 FINDINGS: Lower chest: No acute pleural or parenchymal lung disease. Hepatobiliary: Numerous hepatic hypodensities are compatible with cysts, stable. The gallbladder is surgically absent. Pancreas: Unremarkable. No pancreatic ductal dilatation or surrounding inflammatory changes. Spleen: Normal in size without focal abnormality. Adrenals/Urinary Tract: Adrenal glands are unremarkable. Kidneys are normal, without renal calculi, focal lesion, or hydronephrosis. Bladder is unremarkable. Stomach/Bowel: No bowel obstruction or ileus. Postsurgical changes are seen from prior sigmoid colon resection and reanastomosis. No bowel wall thickening or inflammatory change. A percutaneous gastrojejunostomy tube is identified. Catheter is coiled within the gastric lumen, tip at the level of the third portion duodenum. Vascular/Lymphatic: Aortic atherosclerosis. No enlarged abdominal or pelvic lymph nodes. Reproductive: Uterus and bilateral adnexa are unremarkable. Other: No  free fluid or free gas. There are bilateral paraumbilical ventral hernias. The right para umbilical hernia contains a portion of the transverse colon. No evidence of incarceration or obstruction. Musculoskeletal: No acute or  destructive bony lesions. Reconstructed images demonstrate no additional findings. IMPRESSION: 1. Paraumbilical ventral hernias as above. No evidence of bowel incarceration or obstruction. 2. Percutaneous gastrojejunostomy tube coiled within the gastric lumen, with tip extending only to the third portion duodenum. 3.  Aortic Atherosclerosis (ICD10-I70.0). Electronically Signed   By: Sharlet Salina M.D.   On: 12/28/2020 22:45   DG CHEST PORT 1 VIEW  Result Date: 12/28/2020 CLINICAL DATA:  87 year old female with history of cough. EXAM: PORTABLE CHEST 1 VIEW COMPARISON:  Chest x-ray 12/28/2019. FINDINGS: Lung volumes are low. Opacities at the left base, favored to reflect subsegmental atelectasis or chronic scarring. No definite consolidative airspace disease. No pleural effusions. Mild elevation of the right hemidiaphragm is chronic. No pneumothorax. No evidence of pulmonary edema. Heart size is normal. Upper mediastinal contours are within normal limits. Atherosclerotic calcifications in the thoracic aorta. IMPRESSION: 1. Low lung volumes with subsegmental atelectasis or scarring at the left lung base. 2. Aortic atherosclerosis. Electronically Signed   By: Trudie Reed M.D.   On: 12/28/2020 19:52        Scheduled Meds:  amLODipine  5 mg Oral Daily   amoxicillin-clavulanate  1 tablet Oral Q12H   artificial tears  1 application Both Eyes QHS   aspirin EC  81 mg Oral Daily   atorvastatin  10 mg Oral Daily   clopidogrel  75 mg Oral Daily   cycloSPORINE  1 drop Both Eyes BID   DULoxetine  30 mg Oral Daily   feeding supplement (JEVITY 1.5 CAL/FIBER)  780 mL Per Tube Q24H   ferrous sulfate  300 mg Per Tube BID WC   fluticasone  2 spray Each Nare Daily   free water  180 mL Per Tube TID   gabapentin  300 mg Oral BID   heparin  5,000 Units Subcutaneous Q8H   hydrALAZINE  50 mg Oral TID   insulin aspart  0-9 Units Subcutaneous TID AC & HS   latanoprost  1 drop Both Eyes QHS   levothyroxine  125  mcg Oral QAC breakfast   loratadine  10 mg Oral Daily   mometasone-formoterol  2 puff Inhalation BID   multivitamin with minerals  1 tablet Oral Daily   nystatin cream  1 application Topical BID   pantoprazole  40 mg Oral BID   polyethylene glycol  17 g Oral BID   senna-docusate  1 tablet Oral QHS   sodium chloride flush  3 mL Intravenous Q12H   Continuous Infusions:  sodium chloride Stopped (12/29/20 1200)     LOS: 9 days    Time spent: 30 min    Zannie Cove, MD [facility]  12/30/2020, 12:07 PM

## 2020-12-31 LAB — AEROBIC/ANAEROBIC CULTURE W GRAM STAIN (SURGICAL/DEEP WOUND)
Culture: NO GROWTH
Culture: NO GROWTH
Culture: NO GROWTH
Gram Stain: NONE SEEN
Gram Stain: NONE SEEN
Gram Stain: NONE SEEN

## 2020-12-31 LAB — BASIC METABOLIC PANEL
Anion gap: 8 (ref 5–15)
BUN: 38 mg/dL — ABNORMAL HIGH (ref 8–23)
CO2: 26 mmol/L (ref 22–32)
Calcium: 9.5 mg/dL (ref 8.9–10.3)
Chloride: 102 mmol/L (ref 98–111)
Creatinine, Ser: 1.09 mg/dL — ABNORMAL HIGH (ref 0.44–1.00)
GFR, Estimated: 47 mL/min — ABNORMAL LOW (ref >60)
Glucose, Bld: 160 mg/dL — ABNORMAL HIGH (ref 70–99)
Potassium: 4.6 mmol/L (ref 3.5–5.1)
Sodium: 136 mmol/L (ref 135–145)

## 2020-12-31 LAB — GLUCOSE, CAPILLARY
Glucose-Capillary: 144 mg/dL — ABNORMAL HIGH (ref 70–99)
Glucose-Capillary: 118 mg/dL — ABNORMAL HIGH (ref 70–99)

## 2020-12-31 MED ORDER — AMOXICILLIN-POT CLAVULANATE 500-125 MG PO TABS: 1.0000 | ORAL_TABLET | Freq: Two times a day (BID) | ORAL | 0 refills | Status: AC

## 2020-12-31 NOTE — Progress Notes (Signed)
Physical Therapy Treatment Patient Details Name: Shannon [name] MRN: [id number] DOB: 03/10/1924 Today's Date: 12/31/2020   History of Present Illness Shannon [name] is a 87 y.o. female with medical history significant of DM2, HTN, CKD 4, gastric stenosis s/p feeding tube.  She presented to the hospital after an episode of unresponsiveness.  She was brought to hospital by EMS.  She was complaining of bilateral foot pain.  Underwent I&D and bone biopsy of feet on 9/25 due to osteomyelitis L foot.    PT Comments    The pt demos good progress with mobility and decreased assist needed to complete sit-stand transfers at this time. The pt was able to complete sit-stand transfers with minG but no physical assist, and was able to complete short bout of walking followed by 5 min static standing with VSS. The pt was educated on progressive walking plan and exercises she can safely complete until initiation of HHPT. Continues to be safe for d/c home with family support.     Recommendations for follow up therapy are one component of a multi-disciplinary discharge planning process, led by the attending physician.  Recommendations may be updated based on patient status, additional functional criteria and insurance authorization.  Follow Up Recommendations  Home health PT;Supervision for mobility/OOB     Equipment Recommendations  None recommended by PT    Recommendations for Other Services       Precautions / Restrictions Precautions Precautions: Fall Required Braces or Orthoses: Other Brace Other Brace: bilateral post op shoes Restrictions Weight Bearing Restrictions: No     Mobility  Bed Mobility Overal bed mobility: Modified Independent             General bed mobility comments: pt sitting EOB upon arrival    Transfers Overall transfer level: Needs assistance Equipment used: Rolling walker (2 wheeled) Transfers: Sit to/from Stand Sit to Stand: Min guard         General  transfer comment: pt able to rise to standing with increased time but no assist  Ambulation/Gait Ambulation/Gait assistance: Min assist;Min guard Gait Distance (Feet): 25 Feet Assistive device: Rolling walker (2 wheeled) Gait Pattern/deviations: Step-to pattern;Decreased stride length;Antalgic Gait velocity: decreased Gait velocity interpretation: <1.31 ft/sec, indicative of household ambulator General Gait Details: slow but steady with no overt LOB. pt with slight trunk flexion      Balance Overall balance assessment: Needs assistance Sitting-balance support: No upper extremity supported Sitting balance-Leahy Scale: Good     Standing balance support: Bilateral upper extremity supported Standing balance-Leahy Scale: Fair Standing balance comment: pt able to maintain static standing at sink with only single UE support, BUE support for gait                            Cognition Arousal/Alertness: Awake/alert Behavior During Therapy: WFL for tasks assessed/performed Overall Cognitive Status: History of cognitive impairments - at baseline                                 General Comments: pt able to follow all cues well with approrpiate processing time, the pt demos good safety awarenss and was able to ask for assist as needed      Exercises      General Comments General comments (skin integrity, edema, etc.): VSS on RA      Pertinent Vitals/Pain Pain Assessment: Faces Faces Pain Scale: Hurts a little  bit Pain Location: bilateral feet, but pt reports dull pain, jjust sore Pain Descriptors / Indicators: Aching;Sore Pain Intervention(s): Limited activity within patient's tolerance;Monitored during session;Repositioned     PT Goals (current goals can now be found in the care plan section) Acute Rehab PT Goals Patient Stated Goal: to heal feet PT Goal Formulation: With patient/family Time For Goal Achievement: 01/10/21 Potential to Achieve Goals:  Good Progress towards PT goals: Progressing toward goals    Frequency    Min 3X/week      PT Plan Current plan remains appropriate       AM-PAC PT "6 Clicks" Mobility   Outcome Measure  Help needed turning from your back to your side while in a flat bed without using bedrails?: A Little Help needed moving from lying on your back to sitting on the side of a flat bed without using bedrails?: A Little Help needed moving to and from a bed to a chair (including a wheelchair)?: A Little Help needed standing up from a chair using your arms (e.g., wheelchair or bedside chair)?: A Little Help needed to walk in hospital room?: A Little Help needed climbing 3-5 steps with a railing? : A Lot 6 Click Score: 17    End of Session Equipment Utilized During Treatment: Gait belt Activity Tolerance: Patient limited by pain Patient left: with call bell/phone within reach;in bed;with nursing/sitter in room Nurse Communication: Mobility status PT Visit Diagnosis: Other abnormalities of gait and mobility (R26.89);Difficulty in walking, not elsewhere classified (R26.2)     Time: 1308-6578 PT Time Calculation (min) (ACUTE ONLY): 13 min  Charges:  $Gait Training: 8-22 mins                     Vickki Muff, PT, DPT   Acute Rehabilitation Department Pager #: [phone]    Ronnie Derby 12/31/2020, 2:43 PM

## 2020-12-31 NOTE — Discharge Summary (Addendum)
Physician Discharge Summary  Shannon [name] BMW:[id number] DOB: [birth year] DOA: 12/21/2020  PCP: Sharon Seller, NP  Admit date: 12/21/2020 Discharge date: 12/31/2020  Time spent:  Recommendations for Outpatient Follow-up:  PCP in 1 week Podiatry Dr. Lilian Kapur in 1 to 2 weeks   Discharge Diagnoses:  Chronic foot wounds   Osteomyelitis both feet Acute metabolic encephalopathy Headache Meningioma   Type 2 diabetes mellitus with chronic kidney disease (HCC)   Accelerated hypertension   Acute renal failure superimposed on stage 4 chronic kidney disease (HCC)   Chronic diastolic CHF (congestive heart failure) (HCC)   Osteomyelitis of right foot (HCC)   Diabetic infection of right foot (HCC)   Diabetic foot infection (HCC)   Discharge Condition: Stable  Diet recommendation: Diabetic, low-sodium, heart healthy  Filed Weights   12/29/20 0558 12/30/20 0500 12/31/20 0500  Weight: 57.9 kg 57.8 kg 52.6 kg    History of present illness:  Shannon [name] is a 87 y.o. female with medical history significant of DM2, HTN, CKD 4, history of gastric stenosis s/p PEG tube she presented to the hospital after an episode of unresponsiveness.  She was brought to hospital by EMS.  She was complaining of bilateral foot pain.   -Noted to have osteomyelitis of both feet  Hospital Course:    Bilateral Diabetic Foot Infections  Osteomyelitis MRI R foot notable for acute osteomyelitis of 3rd metatarsal, bone marrow edema at resection margin of 5th metatarsal, diffuse soft tissue swelling at the distal stump as well as the dorsum of the forefoot.   MRI L foot notable for dislocation and hallux valgus at the first MTP joint with boy erosion particularaly in the head of the first metatarsal medially, but also in the base of the proximal phalanx, with marrow edema in both of the structures - concerning for osteo and possible septic joint.  Previously on cultures R foot ulcer grew Enterococcus on  9/12 and L foot ulcer cx grew pseudomonas 3 months ago -Seen by vascular surgery, underwent right lower extremity angiogram on 9/3,, optimized for debridement, seen by podiatry underwent irrigation and debridement, foot and bone biopsy on 9/25 -Cultures remain negative -Was on broad-spectrum antibiotics including vancomycin and cefepime till 9/27, ID recommends 4 weeks of Augmentin, this was started 9/28 -Continue 4 weeks of Augmentin, follow-up with podiatry   Headache, general malaise Mild encephalopathy -Started acutely on 9/27, afebrile, no leukocytosis, labs and vital signs unremarkable -CT head, abdomen pelvis and urinalysis unremarkable -Suspect this is multifactorial, cefepime, Imdur likely contributing, symptoms have resolved after discontinuing these meds -MRI brain obtained 9/28, noted a 1.7 cm meningioma on the right, slightly larger than few years ago, do not suspect this was contributing to her symptoms as her headache has resolved now, given advanced age and multiple comorbidities, she is not a candidate for meningioma resection any way, discussed with daughter -mental status has improved, denies any further headaches, Imdur discontinued at discharge   PAD S/p L popliteal angioplasty 10/20/20 S/p RLE angiogram 9/23 recommending aspirin indefinitely, plavix indefinitely.  Statin indefinitely.  Vascular c/s, appreciate assistance, previously underwent angiogram and optimized   AKI on CKD IIIB Baseline creatinine appears to be around 1.45 1.68 at presentation -Improved and stable now   HTN Continue home BP meds, Imdur discontinued due to severe headaches   Type 2 diabetes mellitus, diet controlled A1c 5.5 last month -CBGs were stable   Chronic diastolic CHF -Euvolemic this admission -Lasix resumed at discharge   History of  gastric stenosis with longstanding PEG tube  -Gets all nutrition and meds via G-tube, tube feeds continued  Code Status: DNR  Consultants:   ID Podiatry Vascular surgery  Procedures:  PROCEDURE:   1) US guided left common femoral artery access 2) Right lower extremity angiogram with second order cannulation (35mL total contrast)   Podiatry Dr. Lilian Kapur Irrigation and debridement, foot and bone biopsy 9/25  Discharge Exam: Vitals:   12/31/20 0740 12/31/20 0743  BP: (!) 172/57   Pulse: 69   Resp: 18   Temp: 97.9 F (36.6 C)   SpO2: 100% 100%   General: Awake alert pleasant female sitting up in bed, AAOx3, no distress HEENT: Pupils equal and reactive CVS: S1-S2, regular rate rhythm Lungs: Decreased breath sounds to bases Abdomen: Soft, nontender, nondistended, PEG tube noted, reducible soft hernia noted Extremities: Both feet with dressing, no erythema tenderness or drainage  Discharge Instructions   Discharge Instructions     Diet - low sodium heart healthy   Complete by: As directed    Discharge wound care:   Complete by: As directed    Change dressing every other day with silver alginate and dry gauze dressings   Increase activity slowly   Complete by: As directed       Allergies as of 12/31/2020   No Active Allergies      Medication List     STOP taking these medications    doxycycline 100 MG tablet Commonly known as: VIBRA-TABS   isosorbide mononitrate 30 MG 24 hr tablet Commonly known as: IMDUR   levofloxacin 750 MG tablet Commonly known as: LEVAQUIN       TAKE these medications    acetaminophen 500 MG tablet Commonly known as: TYLENOL Take 500 mg by mouth every 6 (six) hours as needed for moderate pain.   amLODipine 5 MG tablet Commonly known as: NORVASC TAKE 1 TABLET BY MOUTH EVERY DAY   amoxicillin-clavulanate 500-125 MG tablet Commonly known as: AUGMENTIN Take 1 tablet (500 mg total) by mouth 2 (two) times daily for 28 days. What changed:  when to take this additional instructions   Aspirin Low Dose 81 MG EC tablet Generic drug: aspirin Take 1 tablet (81 mg  total) by mouth daily. Swallow whole.   atorvastatin 10 MG tablet Commonly known as: LIPITOR TAKE 1 TABLET BY MOUTH EVERY DAY   b complex vitamins tablet Take 1 tablet by mouth daily.   cholecalciferol 25 MCG (1000 UNIT) tablet Commonly known as: VITAMIN D3 Take 1,000 Units by mouth daily.   clopidogrel 75 MG tablet Commonly known as: Plavix Take 1 tablet (75 mg total) by mouth daily.   cycloSPORINE 0.05 % ophthalmic emulsion Commonly known as: Restasis USE 1 DROP INTO BOTH EYES TWICE DAILY What changed:  how much to take how to take this when to take this additional instructions   Dulera 200-5 MCG/ACT Aero Generic drug: mometasone-formoterol INHALE TWO PUFFS BY MOUTH TWICE DAILY What changed:  how much to take how to take this when to take this additional instructions   DULoxetine 30 MG capsule Commonly known as: CYMBALTA TAKE 1 CAPSULE BY MOUTH EVERY DAY What changed: how much to take   EQL PROBIOTIC COLON SUPPORT PO Take 1 capsule by mouth daily.   feeding supplement (OSMOLITE 1.5 CAL) Liqd Place 711 mLs into feeding tube See admin instructions. Take 711 ml over a 12 hour period per day 9 am-9 pm at a rate of 65   ferrous sulfate 220 (44  Fe) MG/5ML solution TAKE 7.4 MLS (325 MG TOTAL) BY MOUTH DAILY.   fluticasone 50 MCG/ACT nasal spray Commonly known as: FLONASE 2 sprays into each nostril daily.   free water Soln Place 180 mLs into feeding tube 3 (three) times daily.   furosemide 40 MG tablet Commonly known as: LASIX TAKE 0.5-1 TABLETS (20-40 MG TOTAL) BY MOUTH SEE ADMIN INSTRUCTIONS. TAKE 20 MG DAILY, MAY INSTEAD TAKE 40 MG DAILY AS NEEDED FOR EDEMA   gabapentin 300 MG capsule Commonly known as: NEURONTIN TAKE 1 CAPSULE BY MOUTH TWICE A DAY   gentamicin cream 0.1 % Commonly known as: GARAMYCIN Apply 1 application topically daily as needed (dry skin).   hydrALAZINE 50 MG tablet Commonly known as: APRESOLINE TAKE 1 TABLET BY MOUTH THREE TIMES A  DAY   Insulin Pen Needle 31G X 5 MM Misc Commonly known as: B-D UF III MINI PEN NEEDLES Use twice daily with giving insulin injections. Dx E11.9   ipratropium-albuterol 0.5-2.5 (3) MG/3ML Soln Commonly known as: DUONEB Take 3 mLs by nebulization every 6 (six) hours as needed (shortness of breath).   latanoprost 0.005 % ophthalmic solution Commonly known as: XALATAN Place 1 drop into both eyes at bedtime.   levalbuterol 45 MCG/ACT inhaler Commonly known as: XOPENEX HFA Inhale 2 puffs into the lungs every 6 (six) hours as needed for wheezing.   levothyroxine 125 MCG tablet Commonly known as: SYNTHROID TAKE 1 TABLET BY MOUTH EVERY DAY What changed: when to take this   loratadine 10 MG tablet Commonly known as: CLARITIN Take 10 mg by mouth daily.   multivitamin with minerals Tabs tablet Take 1 tablet by mouth daily.   nystatin cream Commonly known as: MYCOSTATIN APPLY TO AFFECTED AREA TWICE A DAY What changed: See the new instructions.   oxyCODONE 5 MG immediate release tablet Commonly known as: Roxicodone Take 0.5-1 tablets (2.5-5 mg total) by mouth every 8 (eight) hours as needed for severe pain.   pantoprazole 40 MG tablet Commonly known as: PROTONIX Take 1 tablet (40 mg total) by mouth 2 (two) times daily.   senna-docusate 8.6-50 MG tablet Commonly known as: Senokot-S Take 1 tablet by mouth at bedtime.   shark liver oil-cocoa butter 0.25-3-85.5 % suppository Commonly known as: PREPARATION H Place 1 suppository rectally as needed for hemorrhoids.   Systane 0.4-0.3 % Gel ophthalmic gel Generic drug: Polyethyl Glycol-Propyl Glycol Place 1 application into both eyes 2 (two) times daily as needed (moisture).   Systane Nighttime Oint Place 1 application into both eyes at bedtime.   traMADol 50 MG tablet Commonly known as: ULTRAM Place 1 tablet (50 mg total) into feeding tube every 6 (six) hours as needed for severe pain.               Discharge Care  Instructions  (From admission, onward)           Start     Ordered   12/31/20 0000  Discharge wound care:       Comments: Change dressing every other day with silver alginate and dry gauze dressings   12/31/20 1156           No Active Allergies  Follow-up Information     [facility] Follow up.   Why: Registered Nurse, Physical Therapy, Occupational Therapy-office to call with visit times. Contact information: [address] [place] [address] [phone]  The results of significant diagnostics from this hospitalization (including imaging, microbiology, ancillary and laboratory) are listed below for reference.    Significant Diagnostic Studies: CT HEAD WO CONTRAST ( )  Result Date: 12/28/2020 CLINICAL DATA:  Headache EXAM: CT HEAD WITHOUT CONTRAST TECHNIQUE: Contiguous axial images were obtained from the base of the skull through the vertex without intravenous contrast. COMPARISON:  CT head dated July 07, 2019 FINDINGS: Brain: Old bilateral cerebellar infarcts. Chronic white matter ischemic change. Mild cerebral atrophy which is likely age related. No evidence of acute infarction, hemorrhage, hydrocephalus, extra-axial collection or mass lesion/mass effect. Vascular: No hyperdense vessel or unexpected calcification. Skull: Normal. Negative for fracture or focal lesion. Sinuses/Orbits: No acute finding. Other: None. IMPRESSION: No acute intracranial abnormality Electronically Signed   By: Allegra Lai M.D.   On: 12/28/2020 13:35   MR BRAIN W WO CONTRAST  Result Date: 12/29/2020 CLINICAL DATA:  Headache.  Altered mental status. EXAM: MRI HEAD WITHOUT AND WITH CONTRAST TECHNIQUE: Multiplanar, multiecho pulse sequences of the brain and surrounding structures were obtained without and with intravenous contrast. CONTRAST:  5mL GADAVIST GADOBUTROL 1 MMOL/ML IV SOLN COMPARISON:  CT head 12/28/2020.  MRI head 07/07/2019 FINDINGS:  Brain: Negative for acute infarct. Generalized atrophy without hydrocephalus. Chronic microvascular ischemic changes in the white matter bilaterally. Chronic infarcts in the cerebellum bilaterally. Negative for hemorrhage Dural-based enhancing mass right frontal convexity measuring 10 x 17 mm. This appears to have enlarged since the prior MRI 2021 when the lesion was difficult to identify however the prior study was not done with contrast and is degraded by motion. Vascular: Normal arterial flow voids at the skull base Skull and upper cervical spine: Cervical spondylosis. Prominent degenerative change pannus at C1-2 causing spinal stenosis. Sinuses/Orbits: Paranasal sinuses clear. Bilateral cataract extraction Other: None IMPRESSION: Negative for acute infarct Atrophy and chronic ischemic change Right frontal dural-based mass compatible with meningioma 17 x 10 mm. This appears to have enlarged since the MRI of 07/07/2019 Electronically Signed   By: Marlan Palau M.D.   On: 12/29/2020 17:42   CT ABDOMEN PELVIS W CONTRAST  Result Date: 12/28/2020 CLINICAL DATA:  Abdominal pain, generalized weakness EXAM: CT ABDOMEN AND PELVIS WITH CONTRAST TECHNIQUE: Multidetector CT imaging of the abdomen and pelvis was performed using the standard protocol following bolus administration of intravenous contrast. CONTRAST:  50mL OMNIPAQUE IOHEXOL 350 MG/ML SOLN COMPARISON:  04/22/2020 FINDINGS: Lower chest: No acute pleural or parenchymal lung disease. Hepatobiliary: Numerous hepatic hypodensities are compatible with cysts, stable. The gallbladder is surgically absent. Pancreas: Unremarkable. No pancreatic ductal dilatation or surrounding inflammatory changes. Spleen: Normal in size without focal abnormality. Adrenals/Urinary Tract: Adrenal glands are unremarkable. Kidneys are normal, without renal calculi, focal lesion, or hydronephrosis. Bladder is unremarkable. Stomach/Bowel: No bowel obstruction or ileus. Postsurgical changes  are seen from prior sigmoid colon resection and reanastomosis. No bowel wall thickening or inflammatory change. A percutaneous gastrojejunostomy tube is identified. Catheter is coiled within the gastric lumen, tip at the level of the third portion duodenum. Vascular/Lymphatic: Aortic atherosclerosis. No enlarged abdominal or pelvic lymph nodes. Reproductive: Uterus and bilateral adnexa are unremarkable. Other: No free fluid or free gas. There are bilateral paraumbilical ventral hernias. The right para umbilical hernia contains a portion of the transverse colon. No evidence of incarceration or obstruction. Musculoskeletal: No acute or destructive bony lesions. Reconstructed images demonstrate no additional findings. IMPRESSION: 1. Paraumbilical ventral hernias as above. No evidence of bowel incarceration or obstruction. 2. Percutaneous gastrojejunostomy tube coiled within the gastric  lumen, with tip extending only to the third portion duodenum. 3.  Aortic Atherosclerosis (ICD10-I70.0). Electronically Signed   By: Sharlet Salina M.D.   On: 12/28/2020 22:45   MR FOOT RIGHT WO CONTRAST  Result Date: 12/21/2020 CLINICAL DATA:  Foot pain, chronic, osteoarthritis suspected EXAM: MRI OF THE RIGHT FOREFOOT WITHOUT CONTRAST TECHNIQUE: Multiplanar, multisequence MR imaging of the right forefoot was performed. No intravenous contrast was administered. COMPARISON:  X-ray 12/13/2020 FINDINGS: Bones/Joint/Cartilage Patient is status post a transmetatarsal amputation of the first through fifth rays. Destructive changes at the third metatarsal resection margin with prominent bone marrow edema extending to the level of the third metatarsal proximal metaphysis. Intermediate to low T1 signal to the level of the mid third metatarsal diaphysis, compatible with acute osteomyelitis. There is bone marrow edema at the resection margin at the level of the fifth metatarsal diaphysis concerning for early acute osteomyelitis. The residual  first, second, and fourth metatarsals are intact without evidence to suggest acute osteomyelitis at this time. Bony structures of the midfoot and visualized hindfoot remain intact. No fractures. No malalignment. Ligaments Intact Lisfranc ligament. Muscles and Tendons Chronic denervation changes of the intrinsic foot musculature. Diffuse intramuscular edema-like signal may reflect myositis and/or denervation. Post amputation changes to the musculotendinous structures of the right forefoot. Soft tissues Diffuse soft tissue swelling at the distal stump as well as at the dorsum of the forefoot. Superficial ulceration at the distal stump distal to the level of the third metatarsal. No organized or drainable fluid collections. IMPRESSION: 1. Acute osteomyelitis of the right third metatarsal. 2. Bone marrow edema at the resection margin of the fifth metatarsal concerning for early acute osteomyelitis. 3. Diffuse soft tissue swelling at the distal stump as well as at the dorsum of the forefoot. Superficial ulceration at the distal stump distal to the level of the third metatarsal. No organized or drainable fluid collections. Electronically Signed   By: Duanne Guess D.O.   On: 12/21/2020 14:25   MR FOOT LEFT WO CONTRAST  Result Date: 12/21/2020 CLINICAL DATA:  Chronic foot pain EXAM: MRI OF THE LEFT FOOT WITHOUT CONTRAST TECHNIQUE: Multiplanar, multisequence MR imaging of the left forefoot was performed. No intravenous contrast was administered. COMPARISON:  Radiographs from 12/13/2020 FINDINGS: Bones/Joint/Cartilage The second and third toes are absent past the distal metatarsal level. There is lateral dislocation of the base of the proximal phalanx of the great toe with respect to the first metatarsal head, with erosion and potentially bony destruction of a substantial portion of the first metatarsal head medially and extensive marrow edema in the distal half of the first metatarsal. There is also angulation at the  first MTP joint compatible with hallux valgus. There is likewise marrow edema in the proximal phalanx great toe and in the adjacent sesamoids, with erosion of the medial base of the proximal phalanx. Transverse fracture of the distal head of the fourth metacarpal. Notable surrounding edema tracking all the way back into the base of the fourth metacarpal. A component of superimposed osteomyelitis is not excluded. Faint edema anteriorly in the calcaneus on image 13 series 8, nonspecific. Ligaments The Lisfranc ligament appears intact. Given the dislocation, ligamentous structures at the first MTP joint are thought to be insufficient. Muscles and Tendons Muscular atrophy in the forefoot. Soft tissues Subcutaneous edema in the soft tissues surrounding the first MTP joint, possibly cellulitis. IMPRESSION: 1. Dislocation and hallux valgus at the first MTP joint, with bony erosions particularly in the head of the first metatarsal  medially, but also in the base of the proximal phalanx, with marrow edema in both of the structures. Although gout can sometimes cause a similar arthropathy, the appearance is concerning for osteomyelitis and possibly septic joint. 2. Transverse fracture of the head of the third metatarsal. The degree of edema throughout the bone is somewhat greater than I would expect for transverse fracture, and raise the possibility of superimposed osteomyelitis. 3. The second and third digits appear amputated at the distal metatarsal level. Electronically Signed   By: Gaylyn Rong M.D.   On: 12/21/2020 14:30   PERIPHERAL VASCULAR CATHETERIZATION  Result Date: 12/24/2020 DATE OF SERVICE: 12/24/2020  PATIENT:  Kristeen Miss  87 y.o. female  PRE-OPERATIVE DIAGNOSIS:  Atherosclerosis of native arteries of right lower extremity causing ulceration and osteomyelitis  POST-OPERATIVE DIAGNOSIS:  Same  PROCEDURE:  1) US guided left common femoral artery access 2) Right lower extremity angiogram with second  order cannulation (35mL total contrast)  SURGEON:  Rande Brunt. Lenell Antu, MD  ASSISTANT: none  ANESTHESIA:   local  ESTIMATED BLOOD LOSS: min  LOCAL MEDICATIONS USED:  LIDOCAINE  COUNTS: confirmed correct.  PATIENT DISPOSITION:  PACU - hemodynamically stable.  Delay start of Pharmacological VTE agent (>24hrs) due to surgical blood loss or risk of bleeding: no  INDICATION FOR PROCEDURE: ARMONI CARTE is a 87 y.o. female with bilateral lower extremity ulceration and osteomyelitis.  She underwent angiography with me in July which treated a left popliteal lesion.  She had much improved flow to her foot.  She has seen some healing of a left foot ulcer, but has underlying osteomyelitis still.  Recent MRI shows that she also has right-sided osteomyelitis with ulceration.  As I did not evaluate her right lower extremity during angiography in July, I offered her repeat angiography.  The patient was amenable and wished to proceed.  OPERATIVE FINDINGS:  Right lower extremity: Common femoral artery: widely patent without flow limiting stenosis Profunda femoris artery:  widely patent without flow limiting stenosis Superficial femoral artery: Diffusely diseased, approximately 50% calcified stenosis Popliteal artery: diffusely diseased Anterior tibial artery: occluded Tibioperoneal trunk: widely patent without flow limiting stenosis Peroneal artery: widely patent without flow limiting stenosis Posterior tibial artery: occluded Pedal circulation: fills via robust collateralization from peroneal  DESCRIPTION OF PROCEDURE: After identification of the patient in the pre-operative holding area, the patient was transferred to the operating room. The patient was positioned supine on the operating room table. Anesthesia was induced. The groins was prepped and draped in standard fashion. A surgical pause was performed confirming correct patient, procedure, and operative location.  The left groin was anesthetized with subcutaneous injection of  1% lidocaine. Using ultrasound guidance, the left common femoral artery was accessed with micropuncture technique. Fluoroscopy was used to confirm cannulation over the femoral head. The 1F sheath was upsized to 51F.  A Benson wire was advanced into the distal aorta. Over the wire an omni flush catheter was advanced to the level of L2. Aortogram was performed - see above for details.  The right common iliac artery was selected with a Bentson guidewire. The wire was advanced into the common femoral artery. Over the wire the omni flush catheter was advanced into the external iliac artery. Selective angiography was performed - see above for details.  The sheath was left in place with plans to remove the sheath in PACU.  Upon completion of the case instrument and sharps counts were confirmed correct. The patient was transferred to the PACU  in good condition. I was present for all portions of the procedure.  PLAN: Patient is optimized in both lower extremities from vascular surgery standpoint.  Continue aspirin, Plavix, statin therapy indefinitely.  Safe to proceed with foot debridement as necessary per podiatry team.  Rande Brunt. Lenell Antu, MD [facility] Phone Number: [phone] 12/24/2020 4:36 PM   DG CHEST PORT 1 VIEW  Result Date: 12/28/2020 CLINICAL DATA:  87 year old female with history of cough. EXAM: PORTABLE CHEST 1 VIEW COMPARISON:  Chest x-ray 12/28/2019. FINDINGS: Lung volumes are low. Opacities at the left base, favored to reflect subsegmental atelectasis or chronic scarring. No definite consolidative airspace disease. No pleural effusions. Mild elevation of the right hemidiaphragm is chronic. No pneumothorax. No evidence of pulmonary edema. Heart size is normal. Upper mediastinal contours are within normal limits. Atherosclerotic calcifications in the thoracic aorta. IMPRESSION: 1. Low lung volumes with subsegmental atelectasis or scarring at the left lung base. 2.  Aortic atherosclerosis. Electronically Signed   By: Trudie Reed M.D.   On: 12/28/2020 19:52   DG Foot Complete Left  Result Date: 12/21/2020 Please see detailed radiograph report in office note.  DG Foot Complete Right  Result Date: 12/21/2020 Please see detailed radiograph report in office note.   Microbiology: Recent Results (from the past 240 hour(s))  Resp Panel by RT-PCR (Flu A&B, Covid) Urine, Clean Catch     Status: None   Collection Time: 12/21/20  8:34 PM   Specimen: Urine, Clean Catch; Nasopharyngeal(NP) swabs in vial transport medium  Result Value Ref Range Status   SARS Coronavirus 2 by RT PCR NEGATIVE NEGATIVE Final    Comment: (NOTE) SARS-CoV-2 target nucleic acids are NOT DETECTED.  The SARS-CoV-2 RNA is generally detectable in upper respiratory specimens during the acute phase of infection. The lowest concentration of SARS-CoV-2 viral copies this assay can detect is 138 copies/mL. A negative result does not preclude SARS-Cov-2 infection and should not be used as the sole basis for treatment or other patient management decisions. A negative result may occur with  improper specimen collection/handling, submission of specimen other than nasopharyngeal swab, presence of viral mutation(s) within the areas targeted by this assay, and inadequate number of viral copies(<138 copies/mL). A negative result must be combined with clinical observations, patient history, and epidemiological information. The expected result is Negative.  Fact Sheet for Patients:  BloggerCourse.com  Fact Sheet for Healthcare Providers:  SeriousBroker.it  This test is no t yet approved or cleared by the [place] FDA and  has been authorized for detection and/or diagnosis of SARS-CoV-2 by FDA under an Emergency Use Authorization (EUA). This EUA will remain  in effect (meaning this test can be used) for the duration of the COVID-19  declaration under Section 564(b)(1) of the Act, 21 U.S.C.section 360bbb-3(b)(1), unless the authorization is terminated  or revoked sooner.       Influenza A by PCR NEGATIVE NEGATIVE Final   Influenza B by PCR NEGATIVE NEGATIVE Final    Comment: (NOTE) The Xpert Xpress SARS-CoV-2/FLU/RSV plus assay is intended as an aid in the diagnosis of influenza from Nasopharyngeal swab specimens and should not be used as a sole basis for treatment. Nasal washings and aspirates are unacceptable for Xpert Xpress SARS-CoV-2/FLU/RSV testing.  Fact Sheet for Patients: BloggerCourse.com  Fact Sheet for Healthcare Providers: SeriousBroker.it  This test is not yet approved or cleared by the [place] FDA and has been authorized for detection and/or diagnosis of SARS-CoV-2 by FDA  under an Emergency Use Authorization (EUA). This EUA will remain in effect (meaning this test can be used) for the duration of the COVID-19 declaration under Section 564(b)(1) of the Act, 21 U.S.C. section 360bbb-3(b)(1), unless the authorization is terminated or revoked.  Performed at [facility], [address], [place], [place] [address]   MRSA Next Gen by PCR, Nasal     Status: None   Collection Time: 12/22/20  1:00 AM   Specimen: Nasal Mucosa; Nasal Swab  Result Value Ref Range Status   MRSA by PCR Next Gen NOT DETECTED NOT DETECTED Final    Comment: (NOTE) The GeneXpert MRSA Assay (FDA approved for NASAL specimens only), is one component of a comprehensive MRSA colonization surveillance program. It is not intended to diagnose MRSA infection nor to guide or monitor treatment for MRSA infections. Test performance is not FDA approved in patients less than 70 years old. Performed at [facility], [address] [place], [place] [address]   Aerobic/Anaerobic Culture w Gram Stain (surgical/deep wound)     Status: None   Collection Time: 12/26/20   8:11 AM   Specimen: PATH Bone biopsy; Tissue  Result Value Ref Range Status   Specimen Description BONE  Final   Special Requests LEFT 1ST METATARSAL BONE FOR CULTURE SPEC A  Final   Gram Stain NO WBC SEEN NO ORGANISMS SEEN   Final   Culture   Final    No growth aerobically or anaerobically. Performed at [facility], [address] [place], [place] [address]    Report Status 12/31/2020 FINAL  Final  Aerobic/Anaerobic Culture w Gram Stain (surgical/deep wound)     Status: None   Collection Time: 12/26/20  8:14 AM   Specimen: PATH Bone biopsy; Tissue  Result Value Ref Range Status   Specimen Description TISSUE  Final   Special Requests RIGHT 3RD METATARSAL BONE FOR CULTURE SPEC B  Final   Gram Stain NO WBC SEEN NO ORGANISMS SEEN   Final   Culture   Final    No growth aerobically or anaerobically. Performed at [facility], [address] [place], [place] [address]    Report Status 12/31/2020 FINAL  Final  Aerobic/Anaerobic Culture w Gram Stain (surgical/deep wound)     Status: None   Collection Time: 12/26/20  8:15 AM   Specimen: PATH Bone biopsy; Tissue  Result Value Ref Range Status   Specimen Description TISSUE  Final   Special Requests RIGHT 5TH METATARSAL BONE FOR CULTURE SPEC C  Final   Gram Stain NO WBC SEEN NO ORGANISMS SEEN   Final   Culture   Final    No growth aerobically or anaerobically. Performed at [facility], [address], [place], [place] [address]    Report Status 12/31/2020 FINAL  Final     Labs: Basic Metabolic Panel: Recent Labs  Lab 12/25/20 0210 12/26/20 0220 12/27/20 0103 12/28/20 0417 12/28/20 1539 12/29/20 0203 12/30/20 0233 12/31/20 0914  NA 133* 131* 129* 130* 131* 131* 133* 136  K 4.1 4.7 4.3 4.4 4.7 4.3 4.4 4.6  CL 106 102 100 103 103 103 103 102  CO2 21* 23 21* 22 24 23 23 26   GLUCOSE 154* 87 191* 142* 95 107* 121* 160*  BUN 27* 26* 39* 49* 47* 42* 35* 38*  CREATININE 1.00 1.05* 1.21* 1.15* 1.07* 1.11*  1.13* 1.09*  CALCIUM 8.9 9.1 8.6* 8.9 9.1 9.0 9.3 9.5  MG 1.8  1.8 1.7 1.8  --  2.0  --   --   PHOS 2.6 3.6 3.5 3.0  --  2.7  --   --    Liver Function Tests: Recent Labs  Lab 12/26/20 0220 12/27/20 0103 12/28/20 0417 12/28/20 1539 12/29/20 0203  AST 40 57* 58* 47* 38  ALT 25 38 45* 42 36  ALKPHOS 75 78 78 82 80  BILITOT 0.7 0.6 0.4 0.6 0.4  PROT 6.2* 5.9* 5.9* 6.1* 5.8*  ALBUMIN 2.5* 2.4* 2.5* 2.5* 2.4*   No results for input(s): LIPASE, AMYLASE in the last 168 hours. No results for input(s): AMMONIA in the last 168 hours. CBC: Recent Labs  Lab 12/26/20 0220 12/27/20 0103 12/28/20 0417 12/28/20 1539 12/29/20 0203 12/30/20 0233  WBC 4.6 4.3 3.9* 5.2 4.5 4.4  NEUTROABS 2.8 2.9 2.0 2.9 2.3  --   HGB 8.9* 8.6* 8.3* 8.7* 8.6* 9.2*  HCT 26.1* 25.3* 24.5* 25.5* 25.2* 26.6*  MCV 94.6 95.5 95.0 94.4 94.7 95.0  PLT 175 179 177 183 187 200   Cardiac Enzymes: No results for input(s): CKTOTAL, CKMB, CKMBINDEX, TROPONINI in the last 168 hours. BNP: BNP (last 3 results) No results for input(s): BNP in the last 8760 hours.  ProBNP (last 3 results) No results for input(s): PROBNP in the last 8760 hours.  CBG: Recent Labs  Lab 12/30/20 1131 12/30/20 1618 12/30/20 2151 12/31/20 0738 12/31/20 1133  GLUCAP 111* 87 104* 144* 118*       Signed:  Zannie Cove MD.  [facility] 12/31/2020, 11:56 AM

## 2020-12-31 NOTE — Progress Notes (Signed)
Patient does not have a guardian

## 2020-12-31 NOTE — Care Management Important Message (Signed)
Important Message  Patient Details  Name: Shannon [name] MRN: [id number] Date of Birth: January 07, 1924   Medicare Important Message Given:  Yes     [name], [name] 12/31/2020, 10:06 AM

## 2021-01-01 IMAGING — XA CM INJECTION ANY COLONIC TUBE WITH FL
3 series · 10 of 10 positions shown · non-contrast
Comparison: none

INDICATION: [AGE] female with an indwelling percutaneous gastrojejunostomy
tube. She is having pain at the tube entry site as well as abdominal
pain and nausea. She presents for tube injection and evaluation.

[Series 1: fl - angio · 4 of 80 frames shown (1 of 2)]
[frame 13/80]
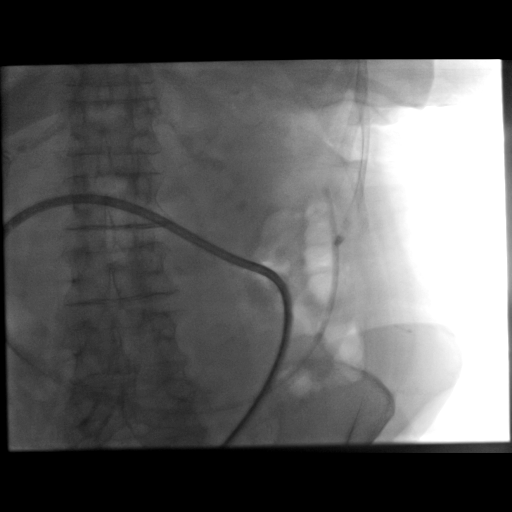
[frame 29/80]
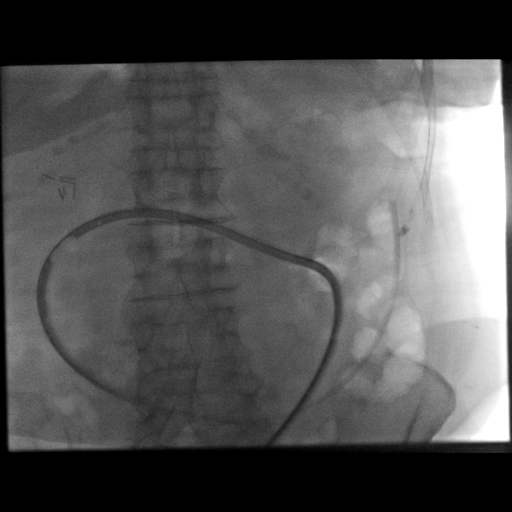
[frame 41/80]
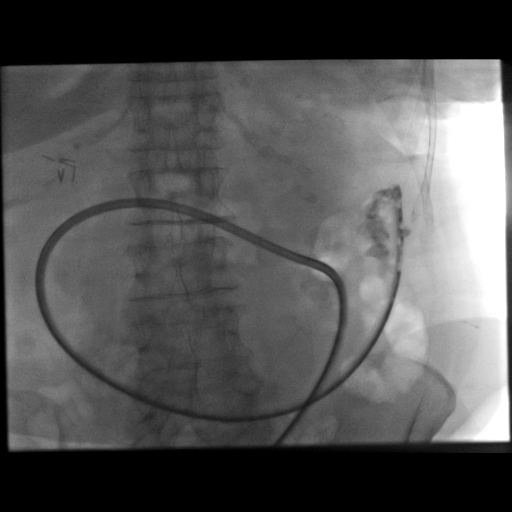
[frame 69/80]
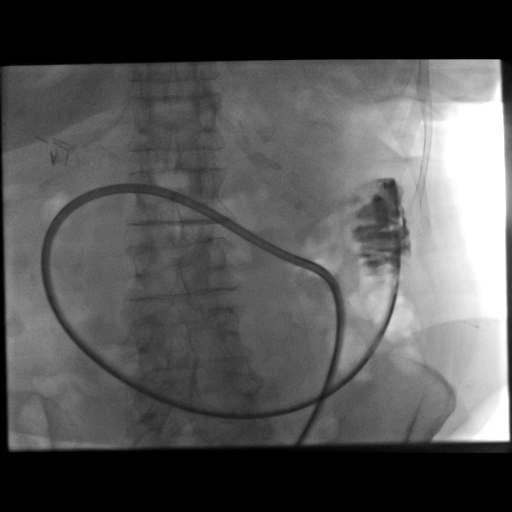

[Series 2: fl - angio · 4 of 42 frames shown (2 of 2)]
[frame 7/42]
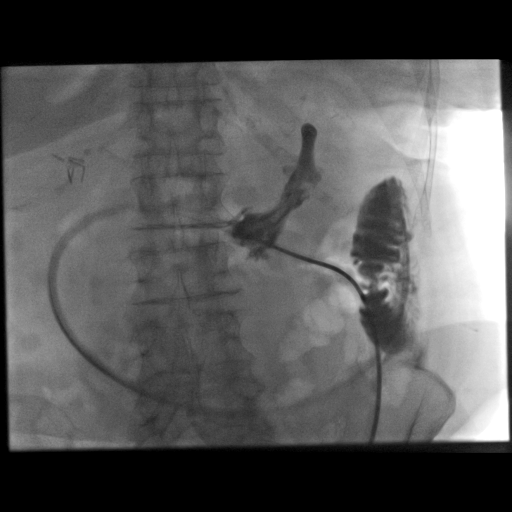
[frame 22/42]
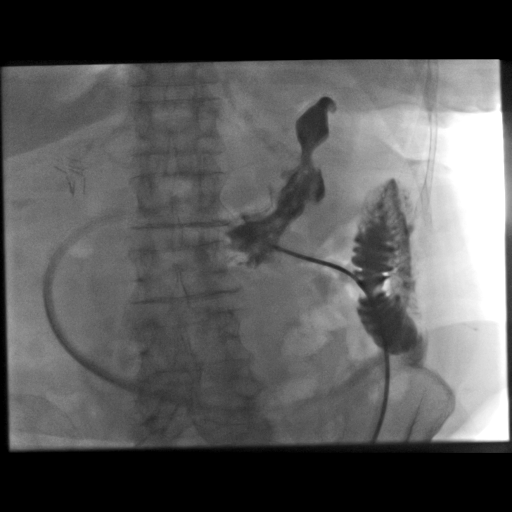
[frame 36/42]
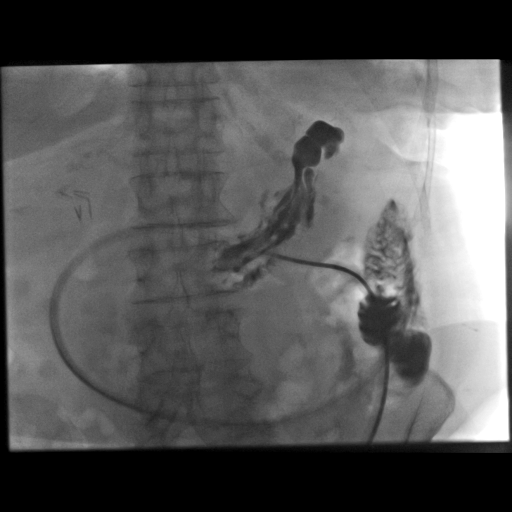
[frame 42/42]
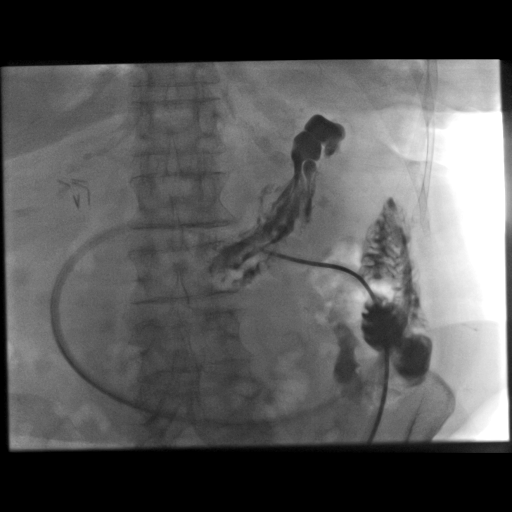

[Series 300: tube placements · 2 of 2 slices shown]
[im 1/2]
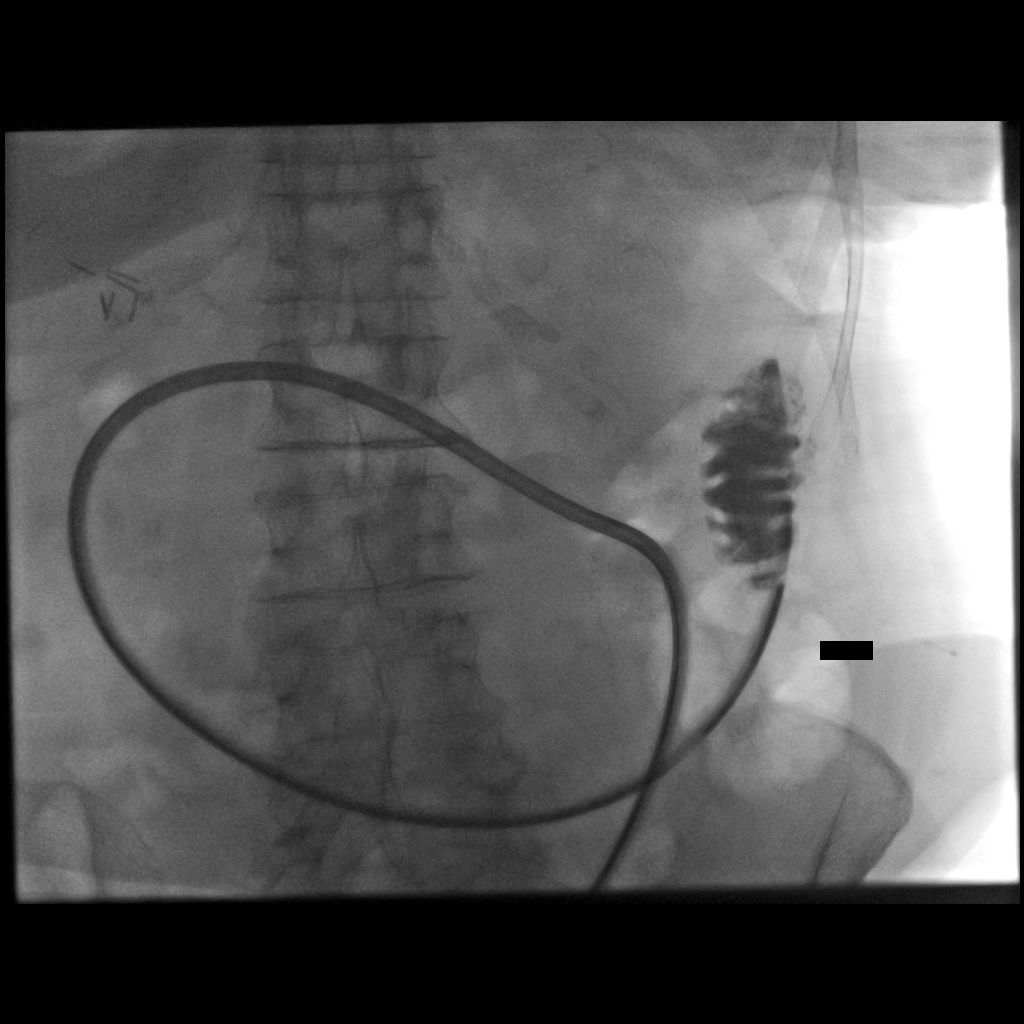
[im 2/2]
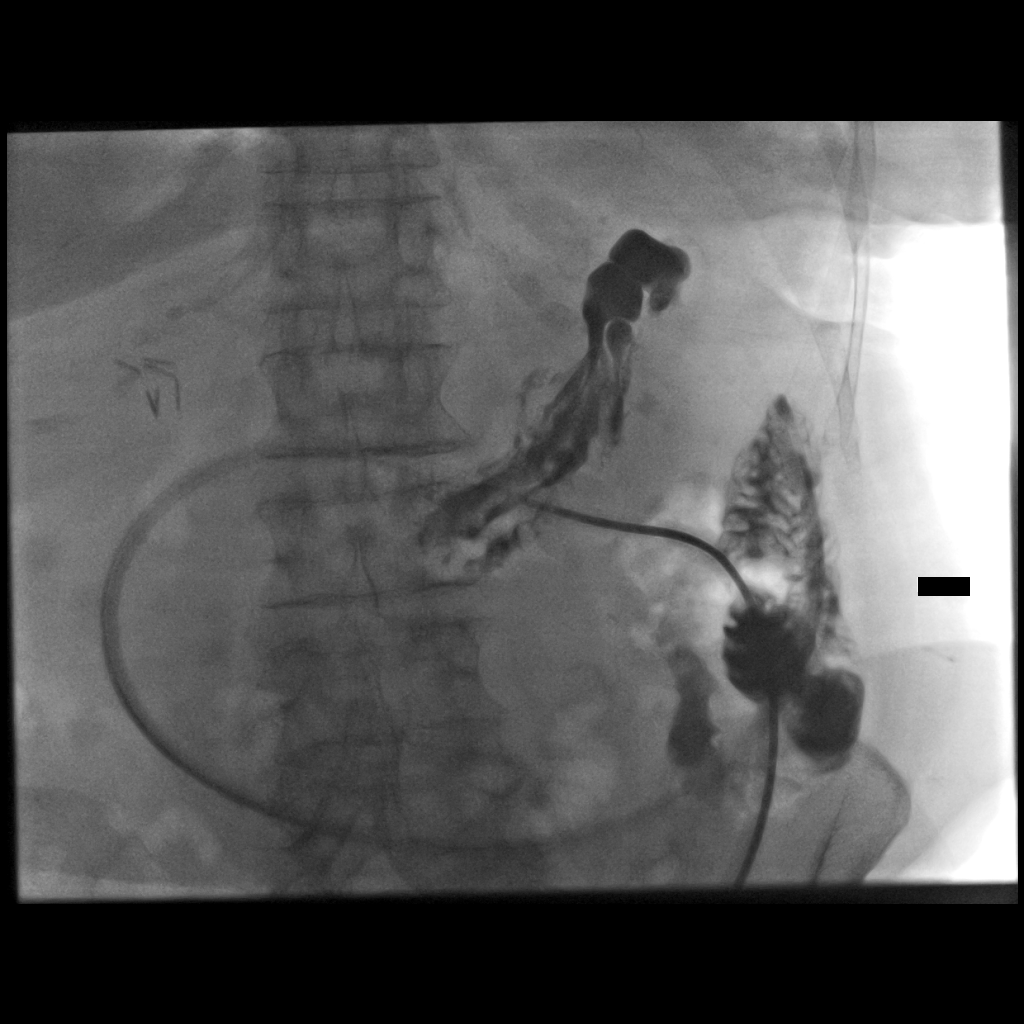

[10 of 10 positions shown; findings below may reference images not displayed]

EXAM:
Enteric tube injection and evaluation under fluoroscopy

MEDICATIONS:
None

ANESTHESIA/SEDATION:
None

CONTRAST:  10 mL Omnipaque 300-administered into the gastric lumen.

FLUOROSCOPY TIME:  Fluoroscopy Time: 0 minutes 24 seconds (2 mGy).

COMPLICATIONS:
None immediate.

PROCEDURE:
Informed written consent was obtained from the patient after a
thorough discussion of the procedural risks, benefits and
alternatives. All questions were addressed. A timeout was performed
prior to the initiation of the procedure.

The tube entry sites was evaluated. There is evidence of chafing
along the superior and lateral aspect of the abdominal wall at the
tube entry site. No evidence of purulence, fluctuance or induration.
No evidence of fungal infection.

Injection was performed first through the jejunal limb, and then
through the gastric arm. Both the jejunal and gastric arms are
widely patent. The tip of the jejunal tube is well positioned in the
proximal jejunum. The gastric portion is well positioned in the
stomach. No evidence of gastric dilation to suggest outlet
obstruction.
IMPRESSION: 1. Well-positioned and normally functioning gastro jejunostomy tube.
2. There is some evidence of external chafing of the abdominal wall
skin surface at the tube entry site. Recommend frequent application
of diaper rash cream to the affected site. Additionally, maintained
tension on the bumper to minimize tube movement.

## 2021-01-02 DIAGNOSIS — I152 Hypertension secondary to endocrine disorders: Secondary | ICD-10-CM | POA: Diagnosis not present

## 2021-01-02 DIAGNOSIS — M159 Polyosteoarthritis, unspecified: Secondary | ICD-10-CM | POA: Diagnosis not present

## 2021-01-02 DIAGNOSIS — E11621 Type 2 diabetes mellitus with foot ulcer: Secondary | ICD-10-CM | POA: Diagnosis not present

## 2021-01-02 DIAGNOSIS — L97411 Non-pressure chronic ulcer of right heel and midfoot limited to breakdown of skin: Secondary | ICD-10-CM | POA: Diagnosis not present

## 2021-01-02 DIAGNOSIS — B952 Enterococcus as the cause of diseases classified elsewhere: Secondary | ICD-10-CM | POA: Diagnosis not present

## 2021-01-02 DIAGNOSIS — N179 Acute kidney failure, unspecified: Secondary | ICD-10-CM | POA: Diagnosis not present

## 2021-01-02 DIAGNOSIS — D631 Anemia in chronic kidney disease: Secondary | ICD-10-CM | POA: Diagnosis not present

## 2021-01-02 DIAGNOSIS — E1159 Type 2 diabetes mellitus with other circulatory complications: Secondary | ICD-10-CM | POA: Diagnosis not present

## 2021-01-02 DIAGNOSIS — E1122 Type 2 diabetes mellitus with diabetic chronic kidney disease: Secondary | ICD-10-CM | POA: Diagnosis not present

## 2021-01-02 DIAGNOSIS — L97521 Non-pressure chronic ulcer of other part of left foot limited to breakdown of skin: Secondary | ICD-10-CM | POA: Diagnosis not present

## 2021-01-02 DIAGNOSIS — N184 Chronic kidney disease, stage 4 (severe): Secondary | ICD-10-CM | POA: Diagnosis not present

## 2021-01-02 DIAGNOSIS — I5032 Chronic diastolic (congestive) heart failure: Secondary | ICD-10-CM | POA: Diagnosis not present

## 2021-01-02 DIAGNOSIS — E1143 Type 2 diabetes mellitus with diabetic autonomic (poly)neuropathy: Secondary | ICD-10-CM | POA: Diagnosis not present

## 2021-01-02 DIAGNOSIS — I2510 Atherosclerotic heart disease of native coronary artery without angina pectoris: Secondary | ICD-10-CM | POA: Diagnosis not present

## 2021-01-02 DIAGNOSIS — E1142 Type 2 diabetes mellitus with diabetic polyneuropathy: Secondary | ICD-10-CM | POA: Diagnosis not present

## 2021-01-02 DIAGNOSIS — I70234 Atherosclerosis of native arteries of right leg with ulceration of heel and midfoot: Secondary | ICD-10-CM | POA: Diagnosis not present

## 2021-01-02 DIAGNOSIS — E222 Syndrome of inappropriate secretion of antidiuretic hormone: Secondary | ICD-10-CM | POA: Diagnosis not present

## 2021-01-02 DIAGNOSIS — I4819 Other persistent atrial fibrillation: Secondary | ICD-10-CM | POA: Diagnosis not present

## 2021-01-02 DIAGNOSIS — J449 Chronic obstructive pulmonary disease, unspecified: Secondary | ICD-10-CM | POA: Diagnosis not present

## 2021-01-02 DIAGNOSIS — E785 Hyperlipidemia, unspecified: Secondary | ICD-10-CM | POA: Diagnosis not present

## 2021-01-02 DIAGNOSIS — M869 Osteomyelitis, unspecified: Secondary | ICD-10-CM | POA: Diagnosis not present

## 2021-01-02 DIAGNOSIS — Z434 Encounter for attention to other artificial openings of digestive tract: Secondary | ICD-10-CM | POA: Diagnosis not present

## 2021-01-02 DIAGNOSIS — E1169 Type 2 diabetes mellitus with other specified complication: Secondary | ICD-10-CM | POA: Diagnosis not present

## 2021-01-02 DIAGNOSIS — I4892 Unspecified atrial flutter: Secondary | ICD-10-CM | POA: Diagnosis not present

## 2021-01-02 DIAGNOSIS — E1151 Type 2 diabetes mellitus with diabetic peripheral angiopathy without gangrene: Secondary | ICD-10-CM | POA: Diagnosis not present

## 2021-01-03 ENCOUNTER — Other Ambulatory Visit: Payer: Self-pay | Admitting: *Deleted

## 2021-01-03 ENCOUNTER — Telehealth: Payer: Self-pay | Admitting: *Deleted

## 2021-01-03 NOTE — Patient Outreach (Signed)
[facility]) Care Management  01/03/2021  Shannon [name] [birth year] [id number]   Member readmitted to hospital 9/20-9/30 for osteomyelitis.  Call placed, state she is tired, but ok.  Report she has been not feeling her best due to her feet but still "pushing."  Denies any urgent concerns, encouraged to contact this care manager with questions.  Agrees to follow up within the next month.   Goals Addressed             This Visit's Progress    [name] - Make and Keep All Appointments   On track      Timeframe:  Short-Term Goal Priority:  Medium Start Date:       9/15      Expected End Date:     11/15 (goal reset)  Barriers: Knowledge                  - ask family or friend for a ride - call to cancel if needed - keep a calendar with appointment dates    Why is this important?   Part of staying healthy is seeing the doctor for follow-up care.  If you forget your appointments, there are some things you can do to stay on track.    Notes:   11/22 - Upcoming appointments reviewed with member  12/21 - reminded of follow up with podiatrist  2/7 - Reviewed upcoming appointments with member, PCP on 3/9, podiatrist on 3/16  4/6 - Encouraged to follow up with GI regarding feeding tube and test results  5/4 - Continues to have issues with feet, particularly left foot bunion.  Appointment with podiatrist on 5/18, will continue to use cream and contact provider if in need of sooner visit  6/7 - follow up with podiatrist tomorrow and 6/22.  Still has hardened area on right foot and cellulitis on left leg/foot.  Appointment with PCP on 7/11  6/29 - attended 6/22 podiatry appointment, will need another surgery.  PCP appointment 6/23 clearing her for surgery.  Awaiting call from vascular specialist for appointment prior to surgery date   7/15 - Was seen by vascular surgeon on 7/5, will have intervention next week.  Hospital follow up with PCP later today.  7/25 -  Upcoming visits with PCP on 8/8, Podiatry on 8/15, and vascular on 8/16.   8/17 - Vascular appointment done 8/16.  Was also seen by podiatry on 8/15, will follow up on 9/12.    9/15 - Podiatry appointment complete, had x-ray done that showed ongoing infection in feet.  Will have MRI on 9/19 as well as appointment with ID on 9/19.    10/3 - Podiatry appointment on 10/12 and vascular on 10/25     [facility] - Perform Foot Care   On track    Barriers: Knowledge   Timeframe:  Long-Range Goal Priority:  High Start Date:       6/7              Expected End Date:    12/15 (goal extended)       - check feet daily for cuts, sores or redness - wash and dry feet carefully every day - wear comfortable, cotton socks - wear comfortable, well-fitting shoes    Why is this important?   Good foot care is very important when you have diabetes.  There are many things you can do to keep your feet healthy and catch a problem early.    Notes:  11/22 - Reviewed updated wound care per most recent MD visit  12/21 - Discussed ongoing wound care with member  2/7 - Reminded of wound care per podiatrist, encouraged to keep wound clean and dry  6/7 - Seen in ED for cellulitis of left foot/leg, completed course of antibiotics, still has swelling.    6/29 - Patient is now needing transmetatarsal removal of the left foot due to non-healing wound and infection.  Surgery scheduled for 7/8  7/15 - Amputation surgery has been postponed, will now have vascular intervention, scheduled for 7/20.  Member denies questions regarding procedure  7/25 - Vascular procedure completed in effort to not have further amputation.  Will continue daily foot care and follow up  8/17 - Vascular follow up completed yesterday, report surgery was successful, more blood flow noted.  Amputation on right foot has been placed on hold. Still have healing areas on left foot, minimal drainage  9/15 - Per podiatry appointment, member still has  infection in both feet.  She will have MRI on 9/19, also placed back on antibiotics.  Blood sugars are stable, 158 today.  Member aware of importance of glucose management in reference to wound healing.  10/3 - Member has continued foot care but wounds has progressed.  Hospitalized for I&D, now has dressing changes by family and [facility] nurse.  Using wheelchair for mobility, No PT currently due to member's wounds.  State her feet are sore, but not very painful.  She is monitoring blood sugars for optimal wound healing, today was 146.       Kemper Durie, [place], MSN [facility] Care Management  [facility] Manager [phone]

## 2021-01-03 NOTE — Telephone Encounter (Signed)
Transition Care Management Follow-up Telephone Call Date of discharge and from where: 12/31/2020 [place] How have you been since you were released from the hospital? [facility] Any questions or concerns? No  Items Reviewed: Did the pt receive and understand the discharge instructions provided? Yes  Medications obtained and verified? Yes  Other? No  Any new allergies since your discharge? No  Dietary orders reviewed? Yes Do you have support at home? Yes   Home Care and Equipment/Supplies: Were home health services ordered? yes If so, what is the name of the agency? [facility]  Has the agency set up a time to come to the patient's home? yes Were any new equipment or medical supplies ordered?  No What is the name of the medical supply agency? na Were you able to get the supplies/equipment? not applicable Do you have any questions related to the use of the equipment or supplies? No  Functional Questionnaire: (I = Independent and D = Dependent) ADLs: I with assistance. Walker  Bathing/Dressing- I with assistance  Meal Prep- D  Eating- I  Maintaining continence- I  Transferring/Ambulation- I with assistance  Managing Meds- I with assistance  Follow up appointments reviewed:  PCP Hospital f/u appt confirmed? Yes  Scheduled to see Dinah on 01/05/21 @ 11:00. [facility] f/u appt confirmed? No   Are transportation arrangements needed? No  If their condition worsens, is the pt aware to call PCP or go to the Emergency Dept.? Yes Was the patient provided with contact information for the PCP's office or ED? Yes Was to pt encouraged to call back with questions or concerns? Yes

## 2021-01-04 DIAGNOSIS — E785 Hyperlipidemia, unspecified: Secondary | ICD-10-CM | POA: Diagnosis not present

## 2021-01-04 DIAGNOSIS — E1169 Type 2 diabetes mellitus with other specified complication: Secondary | ICD-10-CM | POA: Diagnosis not present

## 2021-01-04 DIAGNOSIS — L97411 Non-pressure chronic ulcer of right heel and midfoot limited to breakdown of skin: Secondary | ICD-10-CM | POA: Diagnosis not present

## 2021-01-04 DIAGNOSIS — M869 Osteomyelitis, unspecified: Secondary | ICD-10-CM | POA: Diagnosis not present

## 2021-01-04 DIAGNOSIS — L97521 Non-pressure chronic ulcer of other part of left foot limited to breakdown of skin: Secondary | ICD-10-CM | POA: Diagnosis not present

## 2021-01-04 DIAGNOSIS — E11621 Type 2 diabetes mellitus with foot ulcer: Secondary | ICD-10-CM | POA: Diagnosis not present

## 2021-01-05 ENCOUNTER — Encounter: Payer: Medicare Other | Admitting: Family

## 2021-01-05 ENCOUNTER — Telehealth: Payer: Self-pay | Admitting: *Deleted

## 2021-01-05 NOTE — Telephone Encounter (Signed)
Noted  

## 2021-01-05 NOTE — Telephone Encounter (Signed)
Lattie Haw had a voicemail from [place] with [facility] stating that patient and daughter has refused PT/OT Services.   Patient has an appointment for [facility] 10/6 with [place].   FYI

## 2021-01-06 ENCOUNTER — Other Ambulatory Visit: Payer: Self-pay

## 2021-01-06 ENCOUNTER — Encounter: Payer: Self-pay | Admitting: Family

## 2021-01-06 ENCOUNTER — Ambulatory Visit (INDEPENDENT_AMBULATORY_CARE_PROVIDER_SITE_OTHER): Payer: Medicare Other | Admitting: Family

## 2021-01-06 VITALS — BP 130/80 | HR 93 | Temp 97.5°F | Resp 16 | Ht 66.0 in | Wt 127.6 lb

## 2021-01-06 DIAGNOSIS — M868X7 Other osteomyelitis, ankle and foot: Secondary | ICD-10-CM

## 2021-01-06 DIAGNOSIS — Z23 Encounter for immunization: Secondary | ICD-10-CM

## 2021-01-06 DIAGNOSIS — I5032 Chronic diastolic (congestive) heart failure: Secondary | ICD-10-CM

## 2021-01-06 DIAGNOSIS — E039 Hypothyroidism, unspecified: Secondary | ICD-10-CM | POA: Diagnosis not present

## 2021-01-06 DIAGNOSIS — E1142 Type 2 diabetes mellitus with diabetic polyneuropathy: Secondary | ICD-10-CM | POA: Diagnosis not present

## 2021-01-06 DIAGNOSIS — I10 Essential (primary) hypertension: Secondary | ICD-10-CM | POA: Diagnosis not present

## 2021-01-06 DIAGNOSIS — I700 Atherosclerosis of aorta: Secondary | ICD-10-CM

## 2021-01-06 DIAGNOSIS — E782 Mixed hyperlipidemia: Secondary | ICD-10-CM

## 2021-01-06 NOTE — Patient Instructions (Signed)
Home health Nurse to continue with bilateral foot dressing.Monitor for signs of any infections

## 2021-01-06 NOTE — Progress Notes (Signed)
Provider: Genie Wenke FNP-C  [name], NP  Patient Care Team: [name], NP as PCP - General (Geriatric Medicine) Dorothy Spark, MD (Inactive) as PCP - Cardiology (Cardiology) Edrick Kins, DPM as Consulting Physician (Podiatry) Dorothy Spark, MD (Inactive) as Consulting Physician (Cardiology) [name], NP as Nurse Practitioner (Geriatric Medicine) Camillo Flaming, [place] as Referring Physician (Optometry) [name], RN as [place] Management  Extended Emergency Contact Information Primary Emergency Contact: Armstrong,[name] Address: [address]          [place], [place] [address] [place] of [place] Phone: [phone] Mobile Phone: [phone] Relation: Daughter Secondary Emergency Contact: Tolleson,John  [place] Phone: [phone] Relation: Son  Code Status:  DNR Goals of care: Advanced Directive information Advanced Directives 01/06/2021  Does Patient Have a Medical Advance Directive? Yes  Type of Advance Directive [place]  Does patient want to make changes to medical advance directive? No - Patient declined  Copy of [place] in Chart? Yes - validated most recent copy scanned in chart (See row information)  Would patient like information on creating a medical advance directive? -     Chief Complaint  Patient presents with   Transitions Of Care    TOC 12/21/2020 to 12/31/2020    HPI:  Pt is a 87 y.o. 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 Also Had mild encephalopathy on 12/28/2020 without any leukocytosis and normal vital signs.complained of severe headache.thought due to cefepime and imdur,Cefepime and Imdur were discontinued with much improvement.  MRI done on 12/29/2020 showed a 1.7 cm meningeoma on right slightly larger than previous years ago.Not  a candidate for resection. Appointment 01/12/2021 with podiatrist Dr.McDonald  Had AKI on CKD 3 b her CR was 1.68 baseline 1.45      Past Medical History:  Diagnosis Date   Acute respiratory failure ([place])    Anemia    previous blood transfusions   Arthritis    "all over"   Asthma    Atrial flutter (HCC)    Bradycardia    requiring previous d/c of BB and reduction of amiodarone   CAD (coronary artery disease)    nonobstructive per notes   Chronic diastolic CHF (congestive heart failure) ([facility])    CKD (chronic kidney disease), stage III ([facility])    Complication of blood transfusion    "got the wrong blood type at [facility] in ~ 2015; no adverse reaction that we are aware of"/daughter, [name] (01/27/2016)   COPD (chronic obstructive pulmonary disease) ([facility])    Depression    "light case"   DVT (deep venous thrombosis) ([facility]) 01/2016   a. LLE DVT 01/2016 - switched from Eliquis to Coumadin.   Dyspnea    with some activity   Gastric stenosis    a. s/p stomach  tube   GERD (gastroesophageal reflux disease)    Headache    History of 2019 novel coronavirus disease (COVID-19)    History of blood transfusion    "several" (01/27/2016)   History of stomach ulcers    Hyperlipidemia    Hypertension    Hypothyroidism    On home oxygen therapy    "2L; 9PM til  9AM" (06/27/2017)   PAD (peripheral artery disease) (HCC)    a. prior gangrene, toe amputations, intervention.   PAF (paroxysmal atrial fibrillation) ([facility])    Paraesophageal hernia    Perforated gastric ulcer ([facility])    Pneumonia    "a few times" (06/27/2017)   Seasonal allergies    SIADH (syndrome of inappropriate ADH production) ([facility] [place])    [name] 01/10/2015   Small bowel obstruction ([facility])    "I don't know how many" (01/11/2015)   Stroke ([facility])    several- no residual   Type II diabetes mellitus ([facility])    "related to prednisone use  for > 20 yr; once predinose stopped; no more DM RX" (01/27/2016)   UTI (urinary tract infection) 02/08/2016   Ventral hernia with bowel obstruction    Past Surgical History:  Procedure Laterality Date   ABDOMINAL AORTOGRAM N/A 09/21/2016   Procedure: Abdominal Aortogram;  Surgeon: Waynetta Sandy, MD;  Location: [facility];  Service: Cardiovascular;  Laterality: N/A;   ABDOMINAL AORTOGRAM W/LOWER EXTREMITY N/A 10/20/2020   Procedure: ABDOMINAL AORTOGRAM W/LOWER EXTREMITY;  Surgeon: Cherre Robins, MD;  Location: [facility];  Service: Cardiovascular;  Laterality: N/A;   AMPUTATION Left 09/25/2016   Procedure: AMPUTATION DIGIT- LEFT 2ND AND 3RD TOES;  Surgeon: Rosetta Posner, MD;  Location: [facility];  Service: Vascular;  Laterality: Left;   AMPUTATION Right 05/03/2018   Procedure: AMPUTATION RIGHT GREAT TOE;  Surgeon: Rosetta Posner, MD;  [facility];  Service: Vascular;  Laterality: Right;   CATARACT EXTRACTION W/ INTRAOCULAR LENS  IMPLANT, BILATERAL     CHOLECYSTECTOMY OPEN     COLECTOMY     ESOPHAGOGASTRODUODENOSCOPY N/A 01/19/2014   Procedure: ESOPHAGOGASTRODUODENOSCOPY (EGD);  Surgeon: Irene Shipper, MD;  Location: [facility];  Service: Endoscopy;  Laterality: N/A;   ESOPHAGOGASTRODUODENOSCOPY N/A 01/20/2014   Procedure: ESOPHAGOGASTRODUODENOSCOPY (EGD);  Surgeon: Irene Shipper, MD;  Location: [facility];  Service: Endoscopy;   Laterality: N/A;   ESOPHAGOGASTRODUODENOSCOPY N/A 03/19/2014   Procedure: ESOPHAGOGASTRODUODENOSCOPY (EGD);  Surgeon: Milus Banister, MD;  Location: [facility];  Service: Endoscopy;  Laterality: N/A;   ESOPHAGOGASTRODUODENOSCOPY N/A 07/08/2015   Procedure: ESOPHAGOGASTRODUODENOSCOPY (EGD);  Surgeon: Doran Stabler, MD;  Location: [facility];  Service: Endoscopy;  Laterality: N/A;   ESOPHAGOGASTRODUODENOSCOPY (EGD) WITH PROPOFOL N/A 09/15/2015   Procedure: ESOPHAGOGASTRODUODENOSCOPY (EGD) WITH PROPOFOL;  Surgeon: Manus Gunning, MD;  [facility];  Service: Gastroenterology;  Laterality: N/A;   ESOPHAGOGASTRODUODENOSCOPY (EGD) WITH PROPOFOL N/A 10/12/2020   Procedure: ESOPHAGOGASTRODUODENOSCOPY (EGD) WITH PROPOFOL;  Surgeon: Gatha Mayer, MD;  Location: [facility];  Service: Endoscopy;  Laterality: N/A;   GASTROJEJUNOSTOMY     hx/notes 01/10/2015   GASTROJEJUNOSTOMY N/A 09/23/2015   Procedure: OPEN GASTROJEJUNOSTOMY TUBE PLACEMENT;  Surgeon: Arta Bruce Kinsinger, MD;  [facility];  Service: General;  Laterality: N/A;   GLAUCOMA SURGERY Bilateral    HEMATOMA EVACUATION Right 10/20/2020   Procedure: EVACUATION HEMATOMA RIGHT GROIN;  Surgeon: Cherre Robins, MD;  [facility];  Service: Vascular;  Laterality: Right;   IR CM INJ ANY COLONIC TUBE  W/FLUORO  01/05/2017   IR CM INJ ANY COLONIC TUBE W/FLUORO  06/07/2017   IR CM INJ ANY COLONIC TUBE W/FLUORO  11/05/2017   IR CM INJ ANY COLONIC TUBE W/FLUORO  09/18/2018   IR CM INJ ANY COLONIC TUBE W/FLUORO  03/06/2019   IR CM INJ ANY COLONIC TUBE W/FLUORO  03/11/2019   IR GASTR TUBE CONVERT GASTR-JEJ PER W/FL MOD SED  04/30/2020   IR GENERIC HISTORICAL  01/07/2016   IR GJ TUBE CHANGE 01/07/2016 Jacqulynn Cadet, MD [facility]   IR GENERIC HISTORICAL  01/27/2016   IR MECH REMOV OBSTRUC MAT ANY COLON TUBE W/FLUORO 01/27/2016 Markus Daft, MD [facility]   IR GENERIC HISTORICAL  02/07/2016   IR PATIENT EVAL TECH  0-60 MINS Darrell K Allred, PA-C [facility]   IR GENERIC HISTORICAL  02/08/2016   IR GJ TUBE CHANGE 02/08/2016 Greggory Keen, MD [facility]   IR GENERIC HISTORICAL  01/06/2016   IR GJ TUBE CHANGE 01/06/2016 [facility] OUT REF   IR GENERIC HISTORICAL  05/02/2016   IR CM INJ ANY COLONIC TUBE W/FLUORO 05/02/2016 Arne Cleveland, MD [facility]   IR GENERIC HISTORICAL  05/15/2016   IR GJ TUBE CHANGE 05/15/2016 Sandi Mariscal, MD [facility]   IR GENERIC HISTORICAL  06/28/2016   IR GJ TUBE CHANGE 06/28/2016 [facility]   IR GJ TUBE CHANGE  02/20/2017   IR GJ TUBE CHANGE  05/10/2017   IR GJ TUBE CHANGE  07/06/2017   IR GJ TUBE CHANGE  08/02/2017   IR GJ TUBE CHANGE  10/15/2017   IR GJ TUBE CHANGE  12/26/2017   IR GJ TUBE CHANGE  04/08/2018   IR GJ TUBE CHANGE  06/19/2018   IR GJ TUBE CHANGE  03/03/2019   IR GJ TUBE CHANGE  06/06/2019   IR GJ TUBE CHANGE  09/26/2019   IR GJ TUBE CHANGE  01/09/2020   IR GJ TUBE CHANGE  05/12/2020   IR GJ TUBE CHANGE  10/05/2020   IR GJ TUBE CHANGE  10/14/2020   IR GJ TUBE CHANGE  10/19/2020   IR PATIENT EVAL TECH 0-60 MINS  10/19/2016   IR PATIENT EVAL TECH 0-60 MINS  12/25/2016   IR PATIENT EVAL TECH 0-60 MINS  01/29/2017   IR PATIENT EVAL TECH 0-60 MINS  04/04/2017   IR PATIENT EVAL TECH 0-60 MINS  04/30/2017   IR PATIENT EVAL TECH 0-60 MINS  07/31/2017   IR REPLC DUODEN/JEJUNO TUBE PERCUT W/FLUORO  11/14/2016   IRRIGATION AND DEBRIDEMENT FOOT Bilateral 12/26/2020   Procedure: IRRIGATION AND DEBRIDEMENT FOOT AND BONE BIOPSY;  Surgeon: Trula Slade, DPM;  Location: [facility];  Service: Podiatry;  Laterality: Bilateral;   LAPAROTOMY N/A 01/20/2015   Procedure: EXPLORATORY LAPAROTOMY;  Surgeon: Coralie Keens, MD;  Location: [facility];  Service: General;  Laterality: N/A;   LOWER EXTREMITY ANGIOGRAPHY Left 09/21/2016   Procedure: Lower Extremity Angiography;  Surgeon: Waynetta Sandy, MD;  Location: [facility];  Service: Cardiovascular;   Laterality: Left;   LOWER EXTREMITY ANGIOGRAPHY Right 06/27/2017   Procedure: Lower Extremity Angiography;  Surgeon: Waynetta Sandy, MD;  Location: [facility];  Service: Cardiovascular;  Laterality: Right;   LOWER EXTREMITY ANGIOGRAPHY Right 12/24/2020   Procedure: LOWER EXTREMITY ANGIOGRAPHY;  Surgeon: Cherre Robins, MD;  Location: [facility];  Service: Cardiovascular;  Laterality: Right;   LYSIS OF ADHESION N/A 01/20/2015   Procedure: LYSIS OF ADHESIONS < 1 HOUR;  Surgeon: Coralie Keens,  MD;  Location: [facility];  Service: General;  Laterality: N/A;   PERIPHERAL VASCULAR BALLOON ANGIOPLASTY Left 09/21/2016   Procedure: Peripheral Vascular Balloon Angioplasty;  Surgeon: Waynetta Sandy, MD;  Location: [facility];  Service: Cardiovascular;  Laterality: Left;  drug coated balloon   PERIPHERAL VASCULAR BALLOON ANGIOPLASTY Right 06/27/2017   Procedure: PERIPHERAL VASCULAR BALLOON ANGIOPLASTY;  Surgeon: Waynetta Sandy, MD;  Location: [facility];  Service: Cardiovascular;  Laterality: Right;  SFA/[id number]   PERIPHERAL VASCULAR INTERVENTION Left 10/20/2020   Procedure: PERIPHERAL VASCULAR INTERVENTION;  Surgeon: Cherre Robins, MD;  Location: [facility];  Service: Cardiovascular;  Laterality: Left;   TONSILLECTOMY     TRANSMETATARSAL AMPUTATION Right 11/14/2019   Procedure: TRANSMETATARSAL AMPUTATION RIGHT FOOT;  Surgeon: Edrick Kins, DPM;  Location: [facility];  Service: Podiatry;  Laterality: Right;   TUBAL LIGATION     VENTRAL HERNIA REPAIR  2015   incarcerated ventral hernia ([facility] 09/2013)/notes 01/10/2015   WOUND EXPLORATION Right 10/20/2020   Procedure: PRIMARY CLOSURE COMMON FEMORAL ARTERY;  Surgeon: Cherre Robins, MD;  [facility];  Service: Vascular;  Laterality: Right;    No Known Allergies  Outpatient Encounter Medications as of 01/06/2021  Medication Sig   acetaminophen (TYLENOL) 500 MG tablet Take 500 mg by  mouth every 6 (six) hours as needed for moderate pain.   amLODipine (NORVASC) 5 MG tablet TAKE 1 TABLET BY MOUTH EVERY DAY   amoxicillin-clavulanate (AUGMENTIN) 500-125 MG tablet Take 1 tablet (500 mg total) by mouth 2 (two) times daily for 28 days.   aspirin 81 MG EC tablet Take 1 tablet (81 mg total) by mouth daily. Swallow whole.   atorvastatin (LIPITOR) 10 MG tablet TAKE 1 TABLET BY MOUTH EVERY DAY   b complex vitamins tablet Take 1 tablet by mouth daily.    cholecalciferol (VITAMIN D3) 25 MCG (1000 UNIT) tablet Take 1,000 Units by mouth daily.   clopidogrel (PLAVIX) 75 MG tablet Take 1 tablet (75 mg total) by mouth daily.   cycloSPORINE (RESTASIS) 0.05 % ophthalmic emulsion USE 1 DROP INTO BOTH EYES TWICE DAILY   DULoxetine (CYMBALTA) 30 MG capsule TAKE 1 CAPSULE BY MOUTH EVERY DAY   ferrous sulfate 220 (44 Fe) MG/5ML solution TAKE 7.4 MLS (325 MG TOTAL) BY MOUTH DAILY.   fluticasone (FLONASE) 50 MCG/ACT nasal spray 2 sprays into each nostril daily.   furosemide (LASIX) 40 MG tablet TAKE 0.5-1 TABLETS (20-40 MG TOTAL) BY MOUTH SEE ADMIN INSTRUCTIONS. TAKE 20 MG DAILY, MAY INSTEAD TAKE 40 MG DAILY AS NEEDED FOR EDEMA   gabapentin (NEURONTIN) 300 MG capsule TAKE 1 CAPSULE BY MOUTH TWICE A DAY   gentamicin cream (GARAMYCIN) 0.1 % Apply 1 application topically daily as needed (dry skin).   hydrALAZINE (APRESOLINE) 50 MG tablet TAKE 1 TABLET BY MOUTH THREE TIMES A DAY   Insulin Pen Needle (B-D UF III MINI PEN NEEDLES) 31G X 5 MM MISC Use twice daily with giving insulin injections. Dx E11.9   ipratropium-albuterol (DUONEB) 0.5-2.5 (3) MG/3ML SOLN Take 3 mLs by nebulization every 6 (six) hours as needed (shortness of breath).   latanoprost (XALATAN) 0.005 % ophthalmic solution Place 1 drop into both eyes at bedtime.   levalbuterol (XOPENEX HFA) 45 MCG/ACT inhaler Inhale 2 puffs into the lungs every 6 (six) hours as needed for wheezing.   levothyroxine (SYNTHROID) 125 MCG tablet TAKE 1 TABLET BY  MOUTH EVERY DAY   loratadine (CLARITIN) 10 MG tablet Take 10  mg by mouth daily.   mometasone-formoterol (DULERA) 200-5 MCG/ACT AERO INHALE TWO PUFFS BY MOUTH TWICE DAILY   Multiple Vitamin (MULTIVITAMIN WITH MINERALS) TABS tablet Take 1 tablet by mouth daily.   Nutritional Supplements (FEEDING SUPPLEMENT, OSMOLITE 1.5 CAL,) LIQD Place 711 mLs into feeding tube See admin instructions. Take 711 ml over a 12 hour period per day 9 am-9 pm at a rate of 65   nystatin cream (MYCOSTATIN) APPLY TO AFFECTED AREA TWICE A DAY   oxyCODONE (ROXICODONE) 5 MG immediate release tablet Take 0.5-1 tablets (2.5-5 mg total) by mouth every 8 (eight) hours as needed for severe pain.   pantoprazole (PROTONIX) 40 MG tablet Take 1 tablet (40 mg total) by mouth 2 (two) times daily.   Polyethyl Glycol-Propyl Glycol (SYSTANE) 0.4-0.3 % GEL ophthalmic gel Place 1 application into both eyes 2 (two) times daily as needed (moisture).   Probiotic Product (EQL PROBIOTIC COLON SUPPORT PO) Take 1 capsule by mouth daily.   senna-docusate (SENOKOT-S) 8.6-50 MG tablet Take 1 tablet by mouth at bedtime.   shark liver oil-cocoa butter (PREPARATION H) 0.25-3-85.5 % suppository Place 1 suppository rectally as needed for hemorrhoids.   traMADol (ULTRAM) 50 MG tablet Place 1 tablet (50 mg total) into feeding tube every 6 (six) hours as needed for severe pain.   Water For Irrigation, Sterile (FREE WATER) SOLN Place 180 mLs into feeding tube 3 (three) times daily.   White Petrolatum-Mineral Oil (SYSTANE NIGHTTIME) OINT Place 1 application into both eyes at bedtime.    No facility-administered encounter medications on file as of 01/06/2021.    Review of Systems  Constitutional:  Negative for appetite change, chills, fatigue, fever and unexpected weight change.  HENT:  Negative for congestion, dental problem, ear discharge, ear pain, facial swelling, hearing loss, nosebleeds, postnasal drip, rhinorrhea, sinus pressure, sinus pain, sneezing, sore  throat, tinnitus and trouble swallowing.   Eyes:  Negative for pain, discharge, redness, itching and visual disturbance.  Respiratory:  Negative for cough, chest tightness, shortness of breath and wheezing.   Cardiovascular:  Negative for chest pain, palpitations and leg swelling.  Gastrointestinal:  Negative for abdominal distention, abdominal pain, blood in stool, constipation, diarrhea, nausea and vomiting.       PEG tube   Endocrine: Negative for cold intolerance, heat intolerance, polydipsia, polyphagia and polyuria.  Genitourinary:  Negative for difficulty urinating, dysuria, flank pain, frequency and urgency.  Musculoskeletal:  Positive for arthralgias and gait problem. Negative for back pain, joint swelling, myalgias, neck pain and neck stiffness.  Skin:  Positive for wound. Negative for color change, pallor and rash.       Bilateral foot   Neurological:  Negative for dizziness, syncope, speech difficulty, weakness, light-headedness and headaches.  Hematological:  Does not bruise/bleed easily.  Psychiatric/Behavioral:  Negative for agitation, behavioral problems, confusion, hallucinations, self-injury, sleep disturbance and suicidal ideas. The patient is not nervous/anxious.    Immunization History  Administered Date(s) Administered   Fluad Quad(high Dose 65+) 12/02/2018, 12/30/2019   Influenza, High Dose Seasonal PF 01/19/2017   Influenza,inj,Quad PF,6+ Mos 12/24/2013, 01/27/2015, 12/29/2015, 12/04/2017   Influenza-Unspecified 12/02/2012   Janssen (J&J) SARS-COV-2 Vaccination 07/11/2019   Pneumococcal Conjugate-13 02/03/2014   Pneumococcal Polysaccharide-23 08/31/2016   Tdap 07/18/2016   Zoster, Live 04/12/2011   Pertinent  Health Maintenance Due  Topic Date Due   FOOT EXAM  10/19/2020   INFLUENZA VACCINE  11/01/2020   OPHTHALMOLOGY EXAM  02/08/2021   HEMOGLOBIN A1C  05/07/2021   DEXA SCAN  Completed   Fall Risk  01/06/2021 11/16/2020 11/08/2020 10/15/2020 07/05/2020  Falls in  the past year? 0 1 0 0 0  Number falls in past yr: 0 0 0 0 0  Injury with Fall? 0 0 0 0 0  Risk for fall due to : No Fall Risks - No Fall Risks No Fall Risks -  Follow up Falls evaluation completed - Falls evaluation completed Falls evaluation completed -   Functional Status Survey:    Vitals:   01/06/21 1152  BP: 130/80  Pulse: 93  Resp: 16  Temp: (!) 97.5 F (36.4 C)  SpO2: 98%  Weight: 127 lb 9.6 oz (57.9 kg)  Height: _0  (1.676 m)   Body mass index is 20.6 kg/m. Physical Exam Vitals reviewed.  Constitutional:      General: She is not in acute distress.    Appearance: Normal appearance. She is normal weight. She is not ill-appearing or diaphoretic.  HENT:     Head: Normocephalic.     Right Ear: Tympanic membrane, ear canal and external ear normal. There is no impacted cerumen.     Left Ear: Tympanic membrane, ear canal and external ear normal. There is no impacted cerumen.     Nose: Nose normal. No congestion or rhinorrhea.     Mouth/Throat:     Mouth: Mucous membranes are moist.     Pharynx: Oropharynx is clear. No oropharyngeal exudate or posterior oropharyngeal erythema.  Eyes:     General: No scleral icterus.       Right eye: No discharge.        Left eye: No discharge.     Extraocular Movements: Extraocular movements intact.     Conjunctiva/sclera: Conjunctivae normal.     Pupils: Pupils are equal, round, and reactive to light.  Neck:     Vascular: No carotid bruit.  Cardiovascular:     Rate and Rhythm: Normal rate and regular rhythm.     Heart sounds: Normal heart sounds. No murmur heard.   No friction rub. No gallop.  Pulmonary:     Effort: Pulmonary effort is normal. No respiratory distress.     Breath sounds: Normal breath sounds. No wheezing, rhonchi or rales.  Chest:     Chest wall: No tenderness.  Abdominal:     General: Bowel sounds are normal. There is no distension.     Palpations: Abdomen is soft. There is no mass.     Tenderness: There is  no abdominal tenderness. There is no right CVA tenderness, left CVA tenderness, guarding or rebound.  Musculoskeletal:        General: No swelling or tenderness.     Cervical back: Normal range of motion. No rigidity or tenderness.     Right lower leg: No edema.     Left lower leg: No edema.     Comments: Unsteady gait walks with Rolator  Right foot all toes amputee   Lymphadenopathy:     Cervical: No cervical adenopathy.  Skin:    General: Skin is warm and dry.     Coloration: Skin is not pale.     Findings: No bruising, erythema, lesion or rash.     Comments: Right foot punch out wound x 2 with suture noted on incision line.No drainage noted.cleansed with saline,pat dry,xerofoam and 4 x 4 gauze secured with Kerlix and ACE wrap. No available calcium Alginate to cover wound.  Neurological:     Mental Status: She is alert and oriented to person,  place, and time.     Cranial Nerves: No cranial nerve deficit.     Sensory: No sensory deficit.     Motor: No weakness.     Coordination: Coordination normal.     Gait: Gait normal.  Psychiatric:        Mood and Affect: Mood normal.        Speech: Speech normal.        Behavior: Behavior normal.        Thought Content: Thought content normal.        Judgment: Judgment normal.    Labs reviewed: Recent Labs    12/27/20 0103 12/28/20 0417 12/28/20 1539 12/29/20 0203 12/30/20 0233 12/31/20 0914  NA 129* 130*   < > 131* 133* 136  K 4.3 4.4   < > 4.3 4.4 4.6  CL 100 103   < > 103 103 102  CO2 21* 22   < > _0 GLUCOSE 191* 142*   < > 107* 121* 160*  BUN 39* 49*   < > 42* 35* 38*  CREATININE 1.21* 1.15*   < > 1.11* 1.13* 1.09*  CALCIUM 8.6* 8.9   < > 9.0 9.3 9.5  MG 1.7 1.8  --  2.0  --   --   PHOS 3.5 3.0  --  2.7  --   --    < > = values in this interval not displayed.   Recent Labs    12/28/20 0417 12/28/20 1539 12/29/20 0203  AST 58* 47* 38  ALT 45* 42 36  ALKPHOS 78 82 80  BILITOT 0.4 0.6 0.4  PROT 5.9* 6.1* 5.8*   ALBUMIN 2.5* 2.5* 2.4*   Recent Labs    12/28/20 0417 12/28/20 1539 12/29/20 0203 12/30/20 0233  WBC 3.9* 5.2 4.5 4.4  NEUTROABS 2.0 2.9 2.3  --   HGB 8.3* 8.7* 8.6* 9.2*  HCT 24.5* 25.5* 25.2* 26.6*  MCV 95.0 94.4 94.7 95.0  PLT 177 183 187 200   Lab Results  Component Value Date   TSH 0.41 11/04/2020   Lab Results  Component Value Date   HGBA1C 5.5 11/04/2020   Lab Results  Component Value Date   CHOL 111 11/04/2020   HDL 55 11/04/2020   LDLCALC 39 11/04/2020   TRIG 91 11/04/2020   CHOLHDL 2.0 11/04/2020    Significant Diagnostic Results in last 30 days:  CT HEAD WO CONTRAST (5MM)  Result Date: 12/28/2020 CLINICAL DATA:  Headache EXAM: CT HEAD WITHOUT CONTRAST TECHNIQUE: Contiguous axial images were obtained from the base of the skull through the vertex without intravenous contrast. COMPARISON:  CT head dated July 07, 2019 FINDINGS: Brain: Old bilateral cerebellar infarcts. Chronic white matter ischemic change. Mild cerebral atrophy which is likely age related. No evidence of acute infarction, hemorrhage, hydrocephalus, extra-axial collection or mass lesion/mass effect. Vascular: No hyperdense vessel or unexpected calcification. Skull: Normal. Negative for fracture or focal lesion. Sinuses/Orbits: No acute finding. Other: None. IMPRESSION: No acute intracranial abnormality Electronically Signed   By: Yetta Glassman M.D.   On: 12/28/2020 13:35   MR BRAIN W WO CONTRAST  Result Date: 12/29/2020 CLINICAL DATA:  Headache.  Altered mental status. EXAM: MRI HEAD WITHOUT AND WITH CONTRAST TECHNIQUE: Multiplanar, multiecho pulse sequences of the brain and surrounding structures were obtained without and with intravenous contrast. CONTRAST:  63m GADAVIST GADOBUTROL 1 MMOL/ML IV SOLN COMPARISON:  CT head 12/28/2020.  MRI head 07/07/2019 FINDINGS: Brain: Negative for acute infarct. Generalized atrophy  without hydrocephalus. Chronic microvascular ischemic changes in the white matter  bilaterally. Chronic infarcts in the cerebellum bilaterally. Negative for hemorrhage Dural-based enhancing mass right frontal convexity measuring 10 x 17 mm. This appears to have enlarged since the prior MRI 2021 when the lesion was difficult to identify however the prior study was not done with contrast and is degraded by motion. Vascular: Normal arterial flow voids at the skull base Skull and upper cervical spine: Cervical spondylosis. Prominent degenerative change pannus at C1-2 causing spinal stenosis. Sinuses/Orbits: Paranasal sinuses clear. Bilateral cataract extraction Other: None IMPRESSION: Negative for acute infarct Atrophy and chronic ischemic change Right frontal dural-based mass compatible with meningioma 17 x 10 mm. This appears to have enlarged since the MRI of 07/07/2019 Electronically Signed   By: Franchot Gallo M.D.   On: 12/29/2020 17:42   CT ABDOMEN PELVIS W CONTRAST  Result Date: 12/28/2020 CLINICAL DATA:  Abdominal pain, generalized weakness EXAM: CT ABDOMEN AND PELVIS WITH CONTRAST TECHNIQUE: Multidetector CT imaging of the abdomen and pelvis was performed using the standard protocol following bolus administration of intravenous contrast. CONTRAST:  49mL OMNIPAQUE IOHEXOL 350 MG/ML SOLN COMPARISON:  04/22/2020 FINDINGS: Lower chest: No acute pleural or parenchymal lung disease. Hepatobiliary: Numerous hepatic hypodensities are compatible with cysts, stable. The gallbladder is surgically absent. Pancreas: Unremarkable. No pancreatic ductal dilatation or surrounding inflammatory changes. Spleen: Normal in size without focal abnormality. Adrenals/Urinary Tract: Adrenal glands are unremarkable. Kidneys are normal, without renal calculi, focal lesion, or hydronephrosis. Bladder is unremarkable. Stomach/Bowel: No bowel obstruction or ileus. Postsurgical changes are seen from prior sigmoid colon resection and reanastomosis. No bowel wall thickening or inflammatory change. A percutaneous  gastrojejunostomy tube is identified. Catheter is coiled within the gastric lumen, tip at the level of the third portion duodenum. Vascular/Lymphatic: Aortic atherosclerosis. No enlarged abdominal or pelvic lymph nodes. Reproductive: Uterus and bilateral adnexa are unremarkable. Other: No free fluid or free gas. There are bilateral paraumbilical ventral hernias. The right para umbilical hernia contains a portion of the transverse colon. No evidence of incarceration or obstruction. Musculoskeletal: No acute or destructive bony lesions. Reconstructed images demonstrate no additional findings. IMPRESSION: 1. Paraumbilical ventral hernias as above. No evidence of bowel incarceration or obstruction. 2. Percutaneous gastrojejunostomy tube coiled within the gastric lumen, with tip extending only to the third portion duodenum. 3.  Aortic Atherosclerosis (ICD10-I70.0). Electronically Signed   By: Randa Ngo M.D.   On: 12/28/2020 22:45   MR FOOT RIGHT WO CONTRAST  Result Date: 12/21/2020 CLINICAL DATA:  Foot pain, chronic, osteoarthritis suspected EXAM: MRI OF THE RIGHT FOREFOOT WITHOUT CONTRAST TECHNIQUE: Multiplanar, multisequence MR imaging of the right forefoot was performed. No intravenous contrast was administered. COMPARISON:  X-ray 12/13/2020 FINDINGS: Bones/Joint/Cartilage Patient is status post a transmetatarsal amputation of the first through fifth rays. Destructive changes at the third metatarsal resection margin with prominent bone marrow edema extending to the level of the third metatarsal proximal metaphysis. Intermediate to low T1 signal to the level of the mid third metatarsal diaphysis, compatible with acute osteomyelitis. There is bone marrow edema at the resection margin at the level of the fifth metatarsal diaphysis concerning for early acute osteomyelitis. The residual first, second, and fourth metatarsals are intact without evidence to suggest acute osteomyelitis at this time. Bony structures of  the midfoot and visualized hindfoot remain intact. No fractures. No malalignment. Ligaments Intact Lisfranc ligament. Muscles and Tendons Chronic denervation changes of the intrinsic foot musculature. Diffuse intramuscular edema-like signal may reflect myositis and/or denervation.  Post amputation changes to the musculotendinous structures of the right forefoot. Soft tissues Diffuse soft tissue swelling at the distal stump as well as at the dorsum of the forefoot. Superficial ulceration at the distal stump distal to the level of the third metatarsal. No organized or drainable fluid collections. IMPRESSION: 1. Acute osteomyelitis of the right third metatarsal. 2. Bone marrow edema at the resection margin of the fifth metatarsal concerning for early acute osteomyelitis. 3. Diffuse soft tissue swelling at the distal stump as well as at the dorsum of the forefoot. Superficial ulceration at the distal stump distal to the level of the third metatarsal. No organized or drainable fluid collections. Electronically Signed   By: Davina Poke D.O.   On: 12/21/2020 14:25   MR FOOT LEFT WO CONTRAST  Result Date: 12/21/2020 CLINICAL DATA:  Chronic foot pain EXAM: MRI OF THE LEFT FOOT WITHOUT CONTRAST TECHNIQUE: Multiplanar, multisequence MR imaging of the left forefoot was performed. No intravenous contrast was administered. COMPARISON:  Radiographs from 12/13/2020 FINDINGS: Bones/Joint/Cartilage The second and third toes are absent past the distal metatarsal level. There is lateral dislocation of the base of the proximal phalanx of the great toe with respect to the first metatarsal head, with erosion and potentially bony destruction of a substantial portion of the first metatarsal head medially and extensive marrow edema in the distal half of the first metatarsal. There is also angulation at the first MTP joint compatible with hallux valgus. There is likewise marrow edema in the proximal phalanx great toe and in the  adjacent sesamoids, with erosion of the medial base of the proximal phalanx. Transverse fracture of the distal head of the fourth metacarpal. Notable surrounding edema tracking all the way back into the base of the fourth metacarpal. A component of superimposed osteomyelitis is not excluded. Faint edema anteriorly in the calcaneus on image 13 series 8, nonspecific. Ligaments The Lisfranc ligament appears intact. Given the dislocation, ligamentous structures at the first MTP joint are thought to be insufficient. Muscles and Tendons Muscular atrophy in the forefoot. Soft tissues Subcutaneous edema in the soft tissues surrounding the first MTP joint, possibly cellulitis. IMPRESSION: 1. Dislocation and hallux valgus at the first MTP joint, with bony erosions particularly in the head of the first metatarsal medially, but also in the base of the proximal phalanx, with marrow edema in both of the structures. Although gout can sometimes cause a similar arthropathy, the appearance is concerning for osteomyelitis and possibly septic joint. 2. Transverse fracture of the head of the third metatarsal. The degree of edema throughout the bone is somewhat greater than I would expect for transverse fracture, and raise the possibility of superimposed osteomyelitis. 3. The second and third digits appear amputated at the distal metatarsal level. Electronically Signed   By: Van Clines M.D.   On: 12/21/2020 14:30   PERIPHERAL VASCULAR CATHETERIZATION  Result Date: 12/24/2020 DATE OF SERVICE: 12/24/2020  PATIENT:  Flonnie Overman  87 y.o. female  PRE-OPERATIVE DIAGNOSIS:  Atherosclerosis of native arteries of right lower extremity causing ulceration and osteomyelitis  POST-OPERATIVE DIAGNOSIS:  Same  PROCEDURE:  1) US guided left common femoral artery access 2) Right lower extremity angiogram with second order cannulation (19m total contrast)  SURGEON:  TYevonne Aline HStanford Breed MD  ASSISTANT: none  ANESTHESIA:   local  ESTIMATED BLOOD  LOSS: min  LOCAL MEDICATIONS USED:  LIDOCAINE  COUNTS: confirmed correct.  PATIENT DISPOSITION:  PACU - hemodynamically stable.  Delay start of Pharmacological VTE agent (>24hrs)  due to surgical blood loss or risk of bleeding: no  INDICATION FOR PROCEDURE: KAILIA STARRY is a 87 y.o. female with bilateral lower extremity ulceration and osteomyelitis.  She underwent angiography with me in July which treated a left popliteal lesion.  She had much improved flow to her foot.  She has seen some healing of a left foot ulcer, but has underlying osteomyelitis still.  Recent MRI shows that she also has right-sided osteomyelitis with ulceration.  As I did not evaluate her right lower extremity during angiography in July, I offered her repeat angiography.  The patient was amenable and wished to proceed.  OPERATIVE FINDINGS:  Right lower extremity: Common femoral artery: widely patent without flow limiting stenosis Profunda femoris artery:  widely patent without flow limiting stenosis Superficial femoral artery: Diffusely diseased, approximately 50% calcified stenosis Popliteal artery: diffusely diseased Anterior tibial artery: occluded Tibioperoneal trunk: widely patent without flow limiting stenosis Peroneal artery: widely patent without flow limiting stenosis Posterior tibial artery: occluded Pedal circulation: fills via robust collateralization from peroneal  DESCRIPTION OF PROCEDURE: After identification of the patient in the pre-operative holding area, the patient was transferred to the operating room. The patient was positioned supine on the operating room table. Anesthesia was induced. The groins was prepped and draped in standard fashion. A surgical pause was performed confirming correct patient, procedure, and operative location.  The left groin was anesthetized with subcutaneous injection of 1% lidocaine. Using ultrasound guidance, the left common femoral artery was accessed with micropuncture technique. Fluoroscopy  was used to confirm cannulation over the femoral head. The 11F sheath was upsized to 74F.  A Benson wire was advanced into the distal aorta. Over the wire an omni flush catheter was advanced to the level of L2. Aortogram was performed - see above for details.  The right common iliac artery was selected with a Bentson guidewire. The wire was advanced into the common femoral artery. Over the wire the omni flush catheter was advanced into the external iliac artery. Selective angiography was performed - see above for details.  The sheath was left in place with plans to remove the sheath in PACU.  Upon completion of the case instrument and sharps counts were confirmed correct. The patient was transferred to the PACU in good condition. I was present for all portions of the procedure.  PLAN: Patient is optimized in both lower extremities from vascular surgery standpoint.  Continue aspirin, Plavix, statin therapy indefinitely.  Safe to proceed with foot debridement as necessary per podiatry team.  [name]. Stanford Breed, MD [facility] Phone Number: [phone] 12/24/2020 4:36 PM   DG CHEST PORT 1 VIEW  Result Date: 12/28/2020 CLINICAL DATA:  87 year old female with history of cough. EXAM: PORTABLE CHEST 1 VIEW COMPARISON:  Chest x-ray 12/28/2019. FINDINGS: Lung volumes are low. Opacities at the left base, favored to reflect subsegmental atelectasis or chronic scarring. No definite consolidative airspace disease. No pleural effusions. Mild elevation of the right hemidiaphragm is chronic. No pneumothorax. No evidence of pulmonary edema. Heart size is normal. Upper mediastinal contours are within normal limits. Atherosclerotic calcifications in the thoracic aorta. IMPRESSION: 1. Low lung volumes with subsegmental atelectasis or scarring at the left lung base. 2. Aortic atherosclerosis. Electronically Signed   By: Vinnie Langton M.D.   On: 12/28/2020 19:52   DG Foot Complete  Left  Result Date: 12/21/2020 Please see detailed radiograph report in office note.  DG Foot Complete Right  Result Date: 12/21/2020  Please see detailed radiograph report in office note.   Assessment/Plan 1. Other osteomyelitis of left foot ([place]) Status post hospital admission Afebrile.with suture noted on incision line.No drainage noted.cleansed with saline,pat dry,xerofoam and 4 x 4 gauze secured with Kerlix and ACE wrap. No available calcium Alginate to cover wound. - continue on Augmentin  - CBC with Differential/Platelet  2. Type 2 diabetes mellitus with peripheral neuropathy (HCC) Lab Results  Component Value Date   HGBA1C 5.5 11/04/2020  Continue on Gabapentin  On ASA and Lipitor  - BMP with eGFR([facility])  3. Chronic diastolic CHF (congestive heart failure) (HCC) No signs of fluid overload  - continue on furosemide ,Hydralazine and Amlodipine  4. Acquired hypothyroidism Lab Results  Component Value Date   TSH 0.41 11/04/2020  Continue on levothyroxine   5. Mixed hyperlipidemia LDL at [place]  Continue on atorvastatin   6. Essential hypertension, benign B/p at goal  - continue on furosemide ,Hydralazine and Amlodipine - BMP with eGFR([facility])  7. Aortic atherosclerosis (HCC) On ASA,Plavix and Atorvastatin   8. Need for influenza vaccination Afebrile  Flut shot administered by CMA no acute reaction reported.  - Flu Vaccine QUAD High Dose([facility])  9. Other osteomyelitis of right foot ([place]) Right foot punch out wound x 2 with suture noted on incision line.No drainage noted.cleansed with saline,pat dry,xerofoam and 4 x 4 gauze secured with Kerlix and ACE wrap. No available calcium Alginate to cover wound.  Family/ staff Communication: Reviewed plan of care with patient and daughter   Labs/tests ordered:  - CBC with Differential/Platelet - BMP with eGFR([facility])  Next Appointment: Has appointment in place 03/11/2021 with PCP [place],NP   Sandrea Hughs, NP

## 2021-01-07 LAB — BASIC METABOLIC PANEL WITH GFR
BUN/Creatinine Ratio: 43 (calc) — ABNORMAL HIGH (ref 6–22)
BUN: 59 mg/dL — ABNORMAL HIGH (ref 7–25)
CO2: 25 mmol/L (ref 20–32)
Calcium: 9.3 mg/dL (ref 8.6–10.4)
Chloride: 100 mmol/L (ref 98–110)
Creat: 1.36 mg/dL — ABNORMAL HIGH (ref 0.60–0.95)
Glucose, Bld: 111 mg/dL (ref 65–139)
Potassium: 5.2 mmol/L (ref 3.5–5.3)
Sodium: 135 mmol/L (ref 135–146)
eGFR: 36 mL/min/{1.73_m2} — ABNORMAL LOW (ref >=60)

## 2021-01-07 LAB — CBC WITH DIFFERENTIAL/PLATELET
Absolute Monocytes: 826 cells/uL (ref 200–950)
Basophils Absolute: 59 cells/uL (ref 0–200)
Basophils Relative: 1.1 %
Eosinophils Absolute: 178 cells/uL (ref 15–500)
Eosinophils Relative: 3.3 %
HCT: 27.3 % — ABNORMAL LOW (ref 35.0–45.0)
Hemoglobin: 9 g/dL — ABNORMAL LOW (ref 11.7–15.5)
Lymphs Abs: 756 cells/uL — ABNORMAL LOW (ref 850–3900)
MCH: 32.4 pg (ref 27.0–33.0)
MCHC: 33 g/dL (ref 32.0–36.0)
MCV: 98.2 fL (ref 80.0–100.0)
MPV: 11.6 fL (ref 7.5–12.5)
Monocytes Relative: 15.3 %
Neutro Abs: 3580 cells/uL (ref 1500–7800)
Neutrophils Relative %: 66.3 %
Platelets: 271 10*3/uL (ref 140–400)
RBC: 2.78 10*6/uL — ABNORMAL LOW (ref 3.80–5.10)
RDW: 15 % (ref 11.0–15.0)
Total Lymphocyte: 14 %
WBC: 5.4 10*3/uL (ref 3.8–10.8)

## 2021-01-11 DIAGNOSIS — M869 Osteomyelitis, unspecified: Secondary | ICD-10-CM | POA: Diagnosis not present

## 2021-01-11 DIAGNOSIS — E1169 Type 2 diabetes mellitus with other specified complication: Secondary | ICD-10-CM | POA: Diagnosis not present

## 2021-01-11 DIAGNOSIS — E785 Hyperlipidemia, unspecified: Secondary | ICD-10-CM | POA: Diagnosis not present

## 2021-01-11 DIAGNOSIS — L97521 Non-pressure chronic ulcer of other part of left foot limited to breakdown of skin: Secondary | ICD-10-CM | POA: Diagnosis not present

## 2021-01-11 DIAGNOSIS — L97411 Non-pressure chronic ulcer of right heel and midfoot limited to breakdown of skin: Secondary | ICD-10-CM | POA: Diagnosis not present

## 2021-01-11 DIAGNOSIS — E11621 Type 2 diabetes mellitus with foot ulcer: Secondary | ICD-10-CM | POA: Diagnosis not present

## 2021-01-12 ENCOUNTER — Ambulatory Visit (INDEPENDENT_AMBULATORY_CARE_PROVIDER_SITE_OTHER): Payer: Medicare Other | Admitting: Podiatry

## 2021-01-12 ENCOUNTER — Other Ambulatory Visit: Payer: Self-pay

## 2021-01-12 DIAGNOSIS — L97522 Non-pressure chronic ulcer of other part of left foot with fat layer exposed: Secondary | ICD-10-CM

## 2021-01-12 DIAGNOSIS — E0859 Diabetes mellitus due to underlying condition with other circulatory complications: Secondary | ICD-10-CM

## 2021-01-12 DIAGNOSIS — L97512 Non-pressure chronic ulcer of other part of right foot with fat layer exposed: Secondary | ICD-10-CM | POA: Diagnosis not present

## 2021-01-12 NOTE — Progress Notes (Signed)
Subjective:  Patient status post bone biopsy with debridement of wounds to the bilateral feet.  DOS: 12/26/2020.  Patient states that she is doing much better.  She has been applying a silver alginate wound dressing to the wounds with significant improvement.  She presents for further treatment and evaluation  Past Medical History:  Diagnosis Date   Acute respiratory failure ([place])    Anemia    previous blood transfusions   Arthritis    "all over"   Asthma    Atrial flutter (HCC)    Bradycardia    requiring previous d/c of BB and reduction of amiodarone   CAD (coronary artery disease)    nonobstructive per notes   Chronic diastolic CHF (congestive heart failure) ([place])    CKD (chronic kidney disease), stage III ([place])    Complication of blood transfusion    "got the wrong blood type at [facility] in ~ 2015; no adverse reaction that we are aware of"/daughter, [name] (01/27/2016)   COPD (chronic obstructive pulmonary disease) ([place])    Depression    "light case"   DVT (deep venous thrombosis) ([place]) 01/2016   a. LLE DVT 01/2016 - switched from Eliquis to Coumadin.   Dyspnea    with some activity   Gastric stenosis    a. s/p stomach tube   GERD (gastroesophageal reflux disease)    Headache    History of 2019 novel coronavirus disease (COVID-19)    History of blood transfusion    "several" (01/27/2016)   History of stomach ulcers    Hyperlipidemia    Hypertension    Hypothyroidism    On home oxygen therapy    "2L; 9PM til 9AM" (06/27/2017)   PAD (peripheral artery disease) (HCC)    a. prior gangrene, toe amputations, intervention.   PAF (paroxysmal atrial fibrillation) ([place])    Paraesophageal hernia    Perforated gastric ulcer ([place])    Pneumonia    "a few times" (06/27/2017)   Seasonal allergies    SIADH (syndrome of inappropriate ADH production) ([place])    [name] 01/10/2015   Small bowel obstruction ([place])    "I don't know how many" (01/11/2015)   Stroke ([place])    several- no  residual   Type II diabetes mellitus ([place])    "related to prednisone use  for > 20 yr; once predinose stopped; no more DM RX" (01/27/2016)   UTI (urinary tract infection) 02/08/2016   Ventral hernia with bowel obstruction      Objective: Physical Exam General: The patient is alert and oriented x3 in no acute distress.  Dermatology: Ulcer noted to the medial aspect of the first MTPJ left which appears somewhat improved since last visit.  Negative for any significant drainage  Ulcer is also noted to the plantar aspect of the right foot with significant improvement.  Vascular: Patient recently underwent balloon angioplasty with vein and vascular  VAS [place] ABI W/WO TBI 09/10/2020 Right: Resting right ankle-brachial index is within normal range using the  posterior tibial artery . No evidence of significant right lower extremity  arterial disease.   Left: Resting left ankle-brachial index indicates noncompressible left  lower extremity arteries. Unable to obtain left great toe index due to  bandage.   Neurological: Epicritic and protective threshold diminished bilaterally.   Musculoskeletal Exam: Status post TMA right foot.  Hallux valgus left with overlying ulcer and degenerative changes possible exposed bone and joint capsule.  Second and third toe amputation left.  Radiographic exam today  12/13/2020:  There continues to be left foot demonstration of history of left second and third toe amputation with resection of the metatarsal heads as well and hallux valgus deformity.  Diffuse degenerative changes noted.  No gas within the tissue.  Cortical erosive changes noted to the medial aspect of the first metatarsal head just underlying the area of the wound which is consistent with osteomyelitis of the distal portion of the first metatarsal.  Right foot demonstrates ulcer at the distal stump of the second metatarsal with some osteolytic changes at the distal portion of the metatarsal consistent  with findings of osteomyelitis  Assessment: 1. s/p transmetatarsal amputation right foot. DOS: 11/14/2019 2.  Ulcer right forefoot amputation stump 3.  Hallux valgus left with ulcer down to level bone and joint. H/o 2nd toe amputation left 4.  Osteomyelitis first metatarsal left foot as well as the amputation site of the second metatarsal right foot amputation stump with overlying ulcer  Plan of Care:  1. Patient was evaluated.  Sutures from prior surgery were removed today 2.  Continue postsurgical shoes 3.  Continue silver alginate wound dressing which appears to be helping significantly 4.  The patient continues to request conservative treatment only.  We will continue conservative wound care 5.  Return to clinic in 3 weeks   Edrick Kins, DPM [facility]  Dr. Edrick Kins, DPM    [address], [place] [address]                Office [phone]  Fax [phone]

## 2021-01-13 ENCOUNTER — Telehealth: Payer: Self-pay

## 2021-01-13 ENCOUNTER — Encounter: Payer: Self-pay | Admitting: Nurse Practitioner

## 2021-01-13 ENCOUNTER — Ambulatory Visit: Payer: Self-pay | Admitting: *Deleted

## 2021-01-13 ENCOUNTER — Ambulatory Visit (INDEPENDENT_AMBULATORY_CARE_PROVIDER_SITE_OTHER): Payer: Medicare Other | Admitting: Nurse Practitioner

## 2021-01-13 DIAGNOSIS — M869 Osteomyelitis, unspecified: Secondary | ICD-10-CM | POA: Diagnosis not present

## 2021-01-13 DIAGNOSIS — E1169 Type 2 diabetes mellitus with other specified complication: Secondary | ICD-10-CM | POA: Diagnosis not present

## 2021-01-13 DIAGNOSIS — Z0000 Encounter for general adult medical examination without abnormal findings: Secondary | ICD-10-CM | POA: Diagnosis not present

## 2021-01-13 DIAGNOSIS — E11621 Type 2 diabetes mellitus with foot ulcer: Secondary | ICD-10-CM | POA: Diagnosis not present

## 2021-01-13 DIAGNOSIS — L97411 Non-pressure chronic ulcer of right heel and midfoot limited to breakdown of skin: Secondary | ICD-10-CM | POA: Diagnosis not present

## 2021-01-13 DIAGNOSIS — L97521 Non-pressure chronic ulcer of other part of left foot limited to breakdown of skin: Secondary | ICD-10-CM | POA: Diagnosis not present

## 2021-01-13 DIAGNOSIS — E785 Hyperlipidemia, unspecified: Secondary | ICD-10-CM | POA: Diagnosis not present

## 2021-01-13 NOTE — Telephone Encounter (Signed)
[name], bieker are scheduled for a virtual visit with your provider today.    Just as we do with appointments in the office, we must obtain your consent to participate.  Your consent will be active for this visit and any virtual visit you may have with one of our providers in the next 365 days.    If you have a MyChart account, I can also send a copy of this consent to you electronically.  All virtual visits are billed to your insurance company just like a traditional visit in the office.  As this is a virtual visit, video technology does not allow for your provider to perform a traditional examination.  This may limit your provider's ability to fully assess your condition.  If your provider identifies any concerns that need to be evaluated in person or the need to arrange testing such as labs, EKG, etc, we will make arrangements to do so.    Although advances in technology are sophisticated, we cannot ensure that it will always work on either your end or our end.  If the connection with a video visit is poor, we may have to switch to a telephone visit.  With either a video or telephone visit, we are not always able to ensure that we have a secure connection.   I need to obtain your verbal consent now.   Are you willing to proceed with your visit today?   Shannon [name] has provided verbal consent on 01/13/2021 for a virtual visit (video or telephone).   Carroll Kinds, CMA 01/13/2021  1:57 PM

## 2021-01-13 NOTE — Progress Notes (Signed)
Subjective:   Shannon [name] is a 87 y.o. female who presents for Medicare Annual (Subsequent) preventive examination.  Review of Systems     Cardiac Risk Factors include: advanced age (>54men, >65 women);diabetes mellitus;hypertension;sedentary lifestyle     Objective:    There were no vitals filed for this visit. There is no height or weight on file to calculate BMI.  Advanced Directives 01/13/2021 01/06/2021 12/22/2020 11/16/2020 11/08/2020 10/20/2020 10/15/2020  Does Patient Have a Medical Advance Directive? Yes Yes Yes Yes Yes Yes Yes  Type of Industrial/product designer of [employer] Power of [place];Living will [place];Living will [place];Living will [place];Living will [place];Living will  Does patient want to make changes to medical advance directive? No - Patient declined No - Patient declined Yes (ED - Information included in AVS) - No - Patient declined No - Patient declined No - Patient declined  Copy of [place] in Chart? Yes - validated most recent copy scanned in chart (See row information) Yes - validated most recent copy scanned in chart (See row information) No - copy requested - Yes - validated most recent copy scanned in chart (See row information) No - copy requested Yes - validated most recent copy scanned in chart (See row information)  Would patient like information on creating a medical advance directive? - - - - - - -    Current Medications (verified) Outpatient Encounter Medications as of 01/13/2021  Medication Sig   acetaminophen (TYLENOL) 500 MG tablet Take 500 mg by mouth every 6 (six) hours as needed for moderate pain.   amLODipine (NORVASC) 5 MG tablet TAKE 1 TABLET BY MOUTH EVERY DAY   amoxicillin-clavulanate (AUGMENTIN) 500-125 MG tablet Take 1 tablet (500 mg total) by mouth 2 (two) times daily for 28 days.    aspirin 81 MG EC tablet Take 1 tablet (81 mg total) by mouth daily. Swallow whole.   atorvastatin (LIPITOR) 10 MG tablet TAKE 1 TABLET BY MOUTH EVERY DAY   b complex vitamins tablet Take 1 tablet by mouth daily.    cholecalciferol (VITAMIN D3) 25 MCG (1000 UNIT) tablet Take 1,000 Units by mouth daily.   clopidogrel (PLAVIX) 75 MG tablet Take 1 tablet (75 mg total) by mouth daily.   cycloSPORINE (RESTASIS) 0.05 % ophthalmic emulsion USE 1 DROP INTO BOTH EYES TWICE DAILY   DULoxetine (CYMBALTA) 30 MG capsule TAKE 1 CAPSULE BY MOUTH EVERY DAY   ferrous sulfate 220 (44 Fe) MG/5ML solution TAKE 7.4 MLS (325 MG TOTAL) BY MOUTH DAILY.   fluticasone (FLONASE) 50 MCG/ACT nasal spray 2 sprays into each nostril daily.   furosemide (LASIX) 40 MG tablet TAKE 0.5-1 TABLETS (20-40 MG TOTAL) BY MOUTH SEE ADMIN INSTRUCTIONS. TAKE 20 MG DAILY, MAY INSTEAD TAKE 40 MG DAILY AS NEEDED FOR EDEMA   gabapentin (NEURONTIN) 300 MG capsule TAKE 1 CAPSULE BY MOUTH TWICE A DAY   gentamicin cream (GARAMYCIN) 0.1 % Apply 1 application topically daily as needed (dry skin).   hydrALAZINE (APRESOLINE) 50 MG tablet TAKE 1 TABLET BY MOUTH THREE TIMES A DAY   Insulin Pen Needle (B-D UF III MINI PEN NEEDLES) 31G X 5 MM MISC Use twice daily with giving insulin injections. Dx E11.9   ipratropium-albuterol (DUONEB) 0.5-2.5 (3) MG/3ML SOLN Take 3 mLs by nebulization every 6 (six) hours as needed (shortness of breath).   latanoprost (XALATAN) 0.005 % ophthalmic solution Place 1  drop into both eyes at bedtime.   levalbuterol (XOPENEX HFA) 45 MCG/ACT inhaler Inhale 2 puffs into the lungs every 6 (six) hours as needed for wheezing.   levothyroxine (SYNTHROID) 125 MCG tablet TAKE 1 TABLET BY MOUTH EVERY DAY   loratadine (CLARITIN) 10 MG tablet Take 10 mg by mouth daily.   mometasone-formoterol (DULERA) 200-5 MCG/ACT AERO INHALE TWO PUFFS BY MOUTH TWICE DAILY   Multiple Vitamin (MULTIVITAMIN WITH MINERALS) TABS tablet Take 1 tablet by mouth  daily.   Nutritional Supplements (FEEDING SUPPLEMENT, OSMOLITE 1.5 CAL,) LIQD Place 711 mLs into feeding tube See admin instructions. Take 711 ml over a 12 hour period per day 9 am-9 pm at a rate of 65   nystatin cream (MYCOSTATIN) APPLY TO AFFECTED AREA TWICE A DAY   oxyCODONE (ROXICODONE) 5 MG immediate release tablet Take 0.5-1 tablets (2.5-5 mg total) by mouth every 8 (eight) hours as needed for severe pain.   pantoprazole (PROTONIX) 40 MG tablet Take 1 tablet (40 mg total) by mouth 2 (two) times daily.   Polyethyl Glycol-Propyl Glycol (SYSTANE) 0.4-0.3 % GEL ophthalmic gel Place 1 application into both eyes 2 (two) times daily as needed (moisture).   Probiotic Product (EQL PROBIOTIC COLON SUPPORT PO) Take 1 capsule by mouth daily.   senna-docusate (SENOKOT-S) 8.6-50 MG tablet Take 1 tablet by mouth at bedtime.   shark liver oil-cocoa butter (PREPARATION H) 0.25-3-85.5 % suppository Place 1 suppository rectally as needed for hemorrhoids.   traMADol (ULTRAM) 50 MG tablet Place 1 tablet (50 mg total) into feeding tube every 6 (six) hours as needed for severe pain.   Water For Irrigation, Sterile (FREE WATER) SOLN Place 180 mLs into feeding tube 3 (three) times daily.   White Petrolatum-Mineral Oil (SYSTANE NIGHTTIME) OINT Place 1 application into both eyes at bedtime.    No facility-administered encounter medications on file as of 01/13/2021.    Allergies (verified) Patient has no active allergies.   History: Past Medical History:  Diagnosis Date   Acute respiratory failure ([place])    Anemia    previous blood transfusions   Arthritis    "all over"   Asthma    Atrial flutter (HCC)    Bradycardia    requiring previous d/c of BB and reduction of amiodarone   CAD (coronary artery disease)    nonobstructive per notes   Chronic diastolic CHF (congestive heart failure) ([place])    CKD (chronic kidney disease), stage III ([place])    Complication of blood transfusion    "got the wrong blood type  at [facility] in ~ 2015; no adverse reaction that we are aware of"/daughter, [name] (01/27/2016)   COPD (chronic obstructive pulmonary disease) ([place])    Depression    "light case"   DVT (deep venous thrombosis) ([place]) 01/2016   a. LLE DVT 01/2016 - switched from Eliquis to Coumadin.   Dyspnea    with some activity   Gastric stenosis    a. s/p stomach tube   GERD (gastroesophageal reflux disease)    Headache    History of 2019 novel coronavirus disease (COVID-19)    History of blood transfusion    "several" (01/27/2016)   History of stomach ulcers    Hyperlipidemia    Hypertension    Hypothyroidism    On home oxygen therapy    "2L; 9PM til 9AM" (06/27/2017)   PAD (peripheral artery disease) (HCC)    a. prior gangrene, toe amputations, intervention.   PAF (paroxysmal atrial fibrillation) ([place])  Paraesophageal hernia    Perforated gastric ulcer ([place])    Pneumonia    "a few times" (06/27/2017)   Seasonal allergies    SIADH (syndrome of inappropriate ADH production) ([place])    [name] 01/10/2015   Small bowel obstruction ([place])    "I don't know how many" (01/11/2015)   Stroke [facility])    several- no residual   Type II diabetes mellitus ([place])    "related to prednisone use  for > 20 yr; once predinose stopped; no more DM RX" (01/27/2016)   UTI (urinary tract infection) 02/08/2016   Ventral hernia with bowel obstruction    Past Surgical History:  Procedure Laterality Date   ABDOMINAL AORTOGRAM N/A 09/21/2016   Procedure: Abdominal Aortogram;  Surgeon: Waynetta Sandy, MD;  Location: [facility];  Service: Cardiovascular;  Laterality: N/A;   ABDOMINAL AORTOGRAM W/LOWER EXTREMITY N/A 10/20/2020   Procedure: ABDOMINAL AORTOGRAM W/LOWER EXTREMITY;  Surgeon: Cherre Robins, MD;  Location: [facility];  Service: Cardiovascular;  Laterality: N/A;   AMPUTATION Left 09/25/2016   Procedure: AMPUTATION DIGIT- LEFT 2ND AND 3RD TOES;  Surgeon: Rosetta Posner, MD;  Location:  [facility];  Service: Vascular;  Laterality: Left;   AMPUTATION Right 05/03/2018   Procedure: AMPUTATION RIGHT GREAT TOE;  Surgeon: Rosetta Posner, MD;  [facility];  Service: Vascular;  Laterality: Right;   CATARACT EXTRACTION W/ INTRAOCULAR LENS  IMPLANT, BILATERAL     CHOLECYSTECTOMY OPEN     COLECTOMY     ESOPHAGOGASTRODUODENOSCOPY N/A 01/19/2014   Procedure: ESOPHAGOGASTRODUODENOSCOPY (EGD);  Surgeon: Irene Shipper, MD;  [facility];  Service: Endoscopy;  Laterality: N/A;   ESOPHAGOGASTRODUODENOSCOPY N/A 01/20/2014   Procedure: ESOPHAGOGASTRODUODENOSCOPY (EGD);  Surgeon: Irene Shipper, MD;  [facility]: [facility];  Service: Endoscopy;  Laterality: N/A;   ESOPHAGOGASTRODUODENOSCOPY N/A 03/19/2014   Procedure: ESOPHAGOGASTRODUODENOSCOPY (EGD);  Surgeon: Milus Banister, MD;  Location: [facility];  Service: Endoscopy;  Laterality: N/A;   ESOPHAGOGASTRODUODENOSCOPY N/A 07/08/2015   Procedure: ESOPHAGOGASTRODUODENOSCOPY (EGD);  Surgeon: Doran Stabler, MD;  Location: [facility];  Service: Endoscopy;  Laterality: N/A;   ESOPHAGOGASTRODUODENOSCOPY (EGD) WITH PROPOFOL N/A 09/15/2015   Procedure: ESOPHAGOGASTRODUODENOSCOPY (EGD) WITH PROPOFOL;  Surgeon: Manus Gunning, MD;  [facility];  Service: Gastroenterology;  Laterality: N/A;   ESOPHAGOGASTRODUODENOSCOPY (EGD) WITH PROPOFOL N/A 10/12/2020   Procedure: ESOPHAGOGASTRODUODENOSCOPY (EGD) WITH PROPOFOL;  Surgeon: Gatha Mayer, MD;  Location: [facility];  Service: Endoscopy;  Laterality: N/A;   FOOT SURGERY     GASTROJEJUNOSTOMY     hx/notes 01/10/2015   GASTROJEJUNOSTOMY N/A 09/23/2015   Procedure: OPEN GASTROJEJUNOSTOMY TUBE PLACEMENT;  Surgeon: Arta Bruce Kinsinger, MD;  [facility];  Service: General;  Laterality: N/A;   GLAUCOMA SURGERY Bilateral    HEMATOMA EVACUATION Right 10/20/2020   Procedure: EVACUATION HEMATOMA RIGHT GROIN;  Surgeon: Cherre Robins, MD;  Location: [facility];  Service: Vascular;   Laterality: Right;   IR CM INJ ANY COLONIC TUBE W/FLUORO  01/05/2017   IR CM INJ ANY COLONIC TUBE W/FLUORO  06/07/2017   IR CM INJ ANY COLONIC TUBE W/FLUORO  11/05/2017   IR CM INJ ANY COLONIC TUBE W/FLUORO  09/18/2018   IR CM INJ ANY COLONIC TUBE W/FLUORO  03/06/2019   IR CM INJ ANY COLONIC TUBE W/FLUORO  03/11/2019   IR GASTR TUBE CONVERT GASTR-JEJ PER W/FL MOD SED  04/30/2020   IR GENERIC HISTORICAL  01/07/2016   IR [place] TUBE CHANGE 01/07/2016 [place]  Laurence Ferrari, MD [facility]   IR GENERIC HISTORICAL  01/27/2016   IR MECH REMOV OBSTRUC MAT ANY COLON TUBE W/FLUORO 01/27/2016 Markus Daft, MD [facility]   IR GENERIC HISTORICAL  02/07/2016   IR PATIENT EVAL TECH 0-60 MINS Darrell K Allred, PA-C [facility]   IR GENERIC HISTORICAL  02/08/2016   IR GJ TUBE CHANGE 02/08/2016 Greggory Keen, MD [facility]   IR GENERIC HISTORICAL  01/06/2016   IR GJ TUBE CHANGE 01/06/2016 [facility] OUT REF   IR GENERIC HISTORICAL  05/02/2016   IR CM INJ ANY COLONIC TUBE W/FLUORO 05/02/2016 Arne Cleveland, MD [facility]   IR GENERIC HISTORICAL  05/15/2016   IR GJ TUBE CHANGE 05/15/2016 Sandi Mariscal, MD [facility]   IR GENERIC HISTORICAL  06/28/2016   IR GJ TUBE CHANGE 06/28/2016 [facility]   IR GJ TUBE CHANGE  02/20/2017   IR GJ TUBE CHANGE  05/10/2017   IR GJ TUBE CHANGE  07/06/2017   IR GJ TUBE CHANGE  08/02/2017   IR GJ TUBE CHANGE  10/15/2017   IR GJ TUBE CHANGE  12/26/2017   IR GJ TUBE CHANGE  04/08/2018   IR GJ TUBE CHANGE  06/19/2018   IR GJ TUBE CHANGE  03/03/2019   IR GJ TUBE CHANGE  06/06/2019   IR GJ TUBE CHANGE  09/26/2019   IR GJ TUBE CHANGE  01/09/2020   IR GJ TUBE CHANGE  05/12/2020   IR GJ TUBE CHANGE  10/05/2020   IR GJ TUBE CHANGE  10/14/2020   IR GJ TUBE CHANGE  10/19/2020   IR PATIENT EVAL TECH 0-60 MINS  10/19/2016   IR PATIENT EVAL TECH 0-60 MINS  12/25/2016   IR PATIENT EVAL TECH 0-60 MINS  01/29/2017   IR PATIENT EVAL TECH 0-60 MINS  04/04/2017   IR PATIENT EVAL TECH  0-60 MINS  04/30/2017   IR PATIENT EVAL TECH 0-60 MINS  07/31/2017   IR REPLC DUODEN/JEJUNO TUBE PERCUT W/FLUORO  11/14/2016   IRRIGATION AND DEBRIDEMENT FOOT Bilateral 12/26/2020   Procedure: IRRIGATION AND DEBRIDEMENT FOOT AND BONE BIOPSY;  Surgeon: Trula Slade, DPM;  Location: [facility];  Service: Podiatry;  Laterality: Bilateral;   LAPAROTOMY N/A 01/20/2015   Procedure: EXPLORATORY LAPAROTOMY;  Surgeon: Coralie Keens, MD;  [facility];  Service: General;  Laterality: N/A;   LOWER EXTREMITY ANGIOGRAPHY Left 09/21/2016   Procedure: Lower Extremity Angiography;  Surgeon: Waynetta Sandy, MD;  Location: [facility];  Service: Cardiovascular;  Laterality: Left;   LOWER EXTREMITY ANGIOGRAPHY Right 06/27/2017   Procedure: Lower Extremity Angiography;  Surgeon: Waynetta Sandy, MD;  Location: [facility];  Service: Cardiovascular;  Laterality: Right;   LOWER EXTREMITY ANGIOGRAPHY Right 12/24/2020   Procedure: LOWER EXTREMITY ANGIOGRAPHY;  Surgeon: Cherre Robins, MD;  Location: [facility];  Service: Cardiovascular;  Laterality: Right;   LYSIS OF ADHESION N/A 01/20/2015   Procedure: LYSIS OF ADHESIONS < 1 HOUR;  Surgeon: Coralie Keens, MD;  Location: [facility];  Service: General;  Laterality: N/A;   PERIPHERAL VASCULAR BALLOON ANGIOPLASTY Left 09/21/2016   Procedure: Peripheral Vascular Balloon Angioplasty;  Surgeon: Waynetta Sandy, MD;  Location: [facility];  Service: Cardiovascular;  Laterality: Left;  drug coated balloon   PERIPHERAL VASCULAR BALLOON ANGIOPLASTY Right 06/27/2017   Procedure: PERIPHERAL VASCULAR BALLOON ANGIOPLASTY;  Surgeon: Waynetta Sandy, MD;  Location: [facility];  Service: Cardiovascular;  Laterality: Right;  SFA/[id number]   PERIPHERAL VASCULAR  INTERVENTION Left 10/20/2020   Procedure: PERIPHERAL VASCULAR INTERVENTION;  Surgeon: Cherre Robins, MD;  Location: [facility];  Service:  Cardiovascular;  Laterality: Left;   TONSILLECTOMY     TRANSMETATARSAL AMPUTATION Right 11/14/2019   Procedure: TRANSMETATARSAL AMPUTATION RIGHT FOOT;  Surgeon: Edrick Kins, DPM;  Location: [facility];  Service: Podiatry;  Laterality: Right;   TUBAL LIGATION     VENTRAL HERNIA REPAIR  2015   incarcerated ventral hernia ([facility] 09/2013)/notes 01/10/2015   WOUND EXPLORATION Right 10/20/2020   Procedure: PRIMARY CLOSURE COMMON FEMORAL ARTERY;  Surgeon: Cherre Robins, MD;  Location: [facility];  Service: Vascular;  Laterality: Right;   Family History  Problem Relation Age of Onset   Stroke Mother    Hypertension Mother    Diabetes Brother    Glaucoma Son    Glaucoma Son    Heart attack Neg Hx    Colon cancer Neg Hx    Stomach cancer Neg Hx    Social History   Socioeconomic History   Marital status: Widowed    Spouse name: Not on file   Number of children: 3   Years of education: Not on file   Highest education level: Not on file  Occupational History   Not on file  Tobacco Use   Smoking status: Never   Smokeless tobacco: Former    Types: Snuff    Quit date: 04/03/1980   Tobacco comments:    "used snuff in my younger days"  Vaping Use   Vaping Use: Never used  Substance and Sexual Activity   Alcohol use: Never    Alcohol/week: 0.0 standard drinks   Drug use: Never   Sexual activity: Not Currently  Other Topics Concern   Not on file  Social History Narrative   Married in 1947, lives in one story house with 2 people and no petsOccupation: ?Has a living Will, [place], and doesn't have DNRWas living with Husband (in frail health) in [place] until 09/2013.  After her [facility] admission they both came to live with daughter in [place] Strain: Not on file  Food Insecurity: Not on file  Transportation Needs: Not on file  Physical Activity: Not on file  Stress: Not on file  Social Connections: Not on file    Tobacco  Counseling Counseling given: Not Answered Tobacco comments: "used snuff in my younger days"   Clinical Intake:  Pre-visit preparation completed: Yes  Pain : No/denies pain     BMI - recorded: 19 Nutritional Status: BMI of 19-24  Normal Nutritional Risks: Unintentional weight loss, Non-healing wound Diabetes: Yes  How often do you need to have someone help you when you read instructions, pamphlets, or other written materials from your doctor or pharmacy?: 3 - Sometimes  Diabetic?yes         Activities of Daily Living In your present state of health, do you have any difficulty performing the following activities: 01/13/2021 12/29/2020  Hearing? N N  Vision? N N  Difficulty concentrating or making decisions? N N  Walking or climbing stairs? Y Y  Dressing or bathing? N Y  Doing errands, shopping? [place]  Preparing Food and eating ? N -  Using the Toilet? N -  In the past six months, have you accidently leaked urine? Y -  Do you have problems with loss of bowel control? N -  Managing your Medications? Y -  Managing  your Finances? Y -  Housekeeping or managing your Housekeeping? Y -  Some recent data might be hidden    Patient Care Team: [name], NP as PCP - General (Geriatric Medicine) Dorothy Spark, MD (Inactive) as PCP - Cardiology (Cardiology) Edrick Kins, DPM as Consulting Physician (Podiatry) Dorothy Spark, MD (Inactive) as Consulting Physician (Cardiology) [name], NP as Nurse Practitioner ([place]) Camillo Flaming, [place] as Referring Physician (Optometry) [name], RN as [place] any recent [place] you may have received from other than [facility] providers in the past year (date may be approximate).     Assessment:   This is a routine wellness examination for Y[place].  Hearing/Vision screen Hearing Screening - Comments:: Patient wears hearing aids Vision Screening -  Comments:: Patient wears glasses. Patient had eye exam November 2021. Patient sees Dr. Peter Garter  Dietary issues and exercise activities discussed: Current Exercise Habits: Home exercise routine, Type of exercise: calisthenics, Time (Minutes): 15, Frequency (Times/Week): 6, Weekly Exercise (Minutes/Week): 90   Goals Addressed   None    Depression Screen PHQ 2/9 Scores 01/13/2021 10/21/2019 07/21/2019 10/10/2018 08/07/2018 10/05/2017 02/28/2017  PHQ - 2 Score 0 0 0 0 0 0 0  Exception Documentation - - - - - - -  Not completed - - - - - - -    Fall Risk Fall Risk  01/13/2021 01/06/2021 11/16/2020 11/08/2020 10/15/2020  Falls in the past year? 0 0 1 0 0  Number falls in past yr: 0 0 0 0 0  Injury with Fall? 0 0 0 0 0  Risk for fall due to : No Fall Risks No Fall Risks - No Fall Risks No Fall Risks  Follow up Falls evaluation completed Falls evaluation completed - Falls evaluation completed Falls evaluation completed    FALL RISK PREVENTION PERTAINING TO THE HOME:  Any stairs in or around the home? Yes  If so, are there any without handrails? No  Home free of loose throw rugs in walkways, pet beds, electrical cords, etc? Yes  Adequate lighting in your home to reduce risk of falls? Yes   ASSISTIVE DEVICES UTILIZED TO PREVENT FALLS:  Life alert? No  Use of a cane, walker or w/c? No  Grab bars in the bathroom? No  Shower chair or bench in shower? Yes  Elevated toilet seat or a handicapped toilet? No   TIMED UP AND GO:  Was the test performed? No .   Cognitive Function: MMSE - Mini Mental State Exam 10/05/2017 08/30/2016 08/29/2015 11/06/2014  Orientation to time 5 5 5 5   Orientation to Place 5 4 5 5   Registration 3 3 3 3   Attention/ Calculation 3 4 5 3   Recall 0 0 2 2  Language- name 2 objects 2 2 2 2   Language- repeat 1 1 1 1   Language- follow 3 step command 3 3 3 3   Language- read & follow direction 1 1 1 1   Write a sentence 1 0 0 0  Copy design 1 0 0 1  Total score 25 23 27 26      6CIT  Screen 01/13/2021 10/21/2019 10/10/2018  What Year? 0 points 0 points 0 points  What month? 0 points 0 points 0 points  What time? 3 points 0 points 0 points  Count back from 20 4 points 0 points 0 points  Months in reverse 4 points 2 points 0 points  Repeat phrase 6 points 6 points  6 points  Total Score 17 8 6     Immunizations Immunization History  Administered Date(s) Administered   Fluad Quad(high Dose 65+) 12/02/2018, 12/30/2019, 01/06/2021   Influenza, High Dose Seasonal PF 01/19/2017   Influenza,inj,Quad PF,6+ Mos 12/24/2013, 01/27/2015, 12/29/2015, 12/04/2017   Influenza-Unspecified 12/02/2012   Janssen (J&J) SARS-COV-2 Vaccination 07/11/2019   Pneumococcal Conjugate-13 02/03/2014   Pneumococcal Polysaccharide-23 08/31/2016   Tdap 07/18/2016   Zoster, Live 04/12/2011    TDAP status: Up to date  Flu Vaccine status: Up to date  Pneumococcal vaccine status: Up to date  Covid-19 vaccine status: Information provided on how to obtain vaccines.   Qualifies for Shingles Vaccine? Yes   Zostavax completed No   Shingrix Completed?: No.    Education has been provided regarding the importance of this vaccine. Patient has been advised to call insurance company to determine out of pocket expense if they have not yet received this vaccine. Advised may also receive vaccine at local pharmacy or Health Dept. Verbalized acceptance and understanding.  Screening Tests Health Maintenance  Topic Date Due   Zoster Vaccines- Shingrix (1 of 2) Never done   COVID-19 Vaccine (2 - Janssen risk series) 08/08/2019   FOOT EXAM  10/19/2020   OPHTHALMOLOGY EXAM  02/08/2021   HEMOGLOBIN A1C  05/07/2021   TETANUS/TDAP  07/19/2026   INFLUENZA VACCINE  Completed   DEXA SCAN  Completed   HPV VACCINES  Aged Out    Health Maintenance  Health Maintenance Due  Topic Date Due   Zoster Vaccines- Shingrix (1 of 2) Never done   COVID-19 Vaccine (2 - Janssen risk series) 08/08/2019   FOOT EXAM  10/19/2020     Colorectal cancer screening: No longer required.   Mammogram status: No longer required due to age.    Lung Cancer Screening: (Low Dose CT Chest recommended if Age 13-80 years, 30 pack-year currently smoking OR have quit w/in 15years.) does not qualify.   Lung Cancer Screening Referral: na  Additional Screening:  Hepatitis C Screening: does not qualify  Vision Screening: Recommended annual ophthalmology exams for early detection of glaucoma and other disorders of the eye. Is the patient up to date with their annual eye exam?  Yes  Who is the provider or what is the name of the office in which the patient attends annual eye exams? [facility] If pt is not established with a provider, would they like to be referred to a provider to establish care? No .   Dental Screening: Recommended annual dental exams for proper oral hygiene  Community Resource Referral / Chronic Care Management: CRR required this visit?  No   CCM required this visit?  No      Plan:     I have personally reviewed and noted the following in the patient's chart:   Medical and social history Use of alcohol, tobacco or illicit drugs  Current medications and supplements including opioid prescriptions.  Functional ability and status Nutritional status Physical activity Advanced directives List of other physicians Hospitalizations, surgeries, and ER visits in previous 12 months Vitals Screenings to include cognitive, depression, and falls Referrals and appointments  In addition, I have reviewed and discussed with patient certain preventive protocols, quality metrics, and best practice recommendations. A written personalized care plan for preventive services as well as general preventive health recommendations were provided to patient.     [name], NP   01/13/2021    Virtual Visit via Telephone Note  I connected withNAME@ on 01/13/21 at  1:30 PM EDT by telephone and verified that I am speaking  with the correct person using two identifiers.  Location: Patient: home Provider: [facility]    I discussed the limitations, risks, security and privacy concerns of performing an evaluation and management service by telephone and the availability of in person appointments. I also discussed with the patient that there may be a patient responsible charge related to this service. The patient expressed understanding and agreed to proceed.   I discussed the assessment and treatment plan with the patient. The patient was provided an opportunity to ask questions and all were answered. The patient agreed with the plan and demonstrated an understanding of the instructions.   The patient was advised to call back or seek an in-person evaluation if the symptoms worsen or if the condition fails to improve as anticipated.  I provided 15 minutes of non-face-to-face time during this encounter.  [name]. Harle Battiest Avs printed and mailed

## 2021-01-13 NOTE — Patient Instructions (Signed)
Shannon [name] , Thank you for taking time to come for your Medicare Wellness Visit. I appreciate your ongoing commitment to your health goals. Please review the following plan we discussed and let me know if I can assist you in the future.   Screening recommendations/referrals: Colonoscopy aged out Mammogram aged out  Recommended yearly ophthalmology/optometry visit for glaucoma screening and checkup Recommended yearly dental visit for hygiene and checkup  Vaccinations: Influenza vaccine up to date Pneumococcal vac cine up to date Tdap vaccine up to date Shingles vaccine RECOMMENDED to get at [place]- to get at local pharmacy    Advanced directives: on file.   Conditions/risks identified: advance age, diabetes, nonhealing wounds   Next appointment: yearly for awv   Preventive Care 42 Years and Older, Female Preventive care refers to lifestyle choices and visits with your health care provider that can promote health and wellness. What does preventive care include? A yearly physical exam. This is also called an annual well check. Dental exams once or twice a year. Routine eye exams. Ask your health care provider how often you should have your eyes checked. Personal lifestyle choices, including: Daily care of your teeth and gums. Regular physical activity. Eating a healthy diet. Avoiding tobacco and drug use. Limiting alcohol use. Practicing safe sex. Taking low-dose aspirin every day. Taking vitamin and mineral supplements as recommended by your health care provider. What happens during an annual well check? The services and screenings done by your health care provider during your annual well check will depend on your age, overall health, lifestyle risk factors, and family history of disease. Counseling  Your health care provider may ask you questions about your: Alcohol use. Tobacco use. Drug use. Emotional well-being. Home and relationship  well-being. Sexual activity. Eating habits. History of falls. Memory and ability to understand (cognition). Work and work Statistician. Reproductive health. Screening  You may have the following tests or measurements: Height, weight, and BMI. Blood pressure. Lipid and cholesterol levels. These may be checked every 5 years, or more frequently if you are over 20 years old. Skin check. Lung cancer screening. You may have this screening every year starting at age 63 if you have a 30-pack-year history of smoking and currently smoke or have quit within the past 15 years. Fecal occult blood test (FOBT) of the stool. You may have this test every year starting at age 5. Flexible sigmoidoscopy or colonoscopy. You may have a sigmoidoscopy every 5 years or a colonoscopy every 10 years starting at age 71. Hepatitis C blood test. Hepatitis B blood test. Sexually transmitted disease (STD) testing. Diabetes screening. This is done by checking your blood sugar (glucose) after you have not eaten for a while (fasting). You may have this done every 1-3 years. Bone density scan. This is done to screen for osteoporosis. You may have this done starting at age 87. Mammogram. This may be done every 1-2 years. Talk to your health care provider about how often you should have regular mammograms. Talk with your health care provider about your test results, treatment options, and if necessary, the need for more tests. Vaccines  Your health care provider may recommend certain vaccines, such as: Influenza vaccine. This is recommended every year. Tetanus, diphtheria, and acellular pertussis (Tdap, Td) vaccine. You may need a Td booster every 10 years. Zoster vaccine. You may need this after age 10. Pneumococcal 13-valent conjugate (PCV13) vaccine. One dose is recommended after age 44. Pneumococcal polysaccharide (PPSV23) vaccine. One  dose is recommended after age 15. Talk to your health care provider about which  screenings and vaccines you need and how often you need them. This information is not intended to replace advice given to you by your health care provider. Make sure you discuss any questions you have with your health care provider. Document Released: 04/16/2015 Document Revised: 12/08/2015 Document Reviewed: 01/19/2015 Elsevier Interactive Patient Education  2017 [place] Prevention in the Home Falls can cause injuries. They can happen to people of all ages. There are many things you can do to make your home safe and to help prevent falls. What can I do on the outside of my home? Regularly fix the edges of walkways and driveways and fix any cracks. Remove anything that might make you trip as you walk through a door, such as a raised step or threshold. Trim any bushes or trees on the path to your home. Use bright outdoor lighting. Clear any walking paths of anything that might make someone trip, such as rocks or tools. Regularly check to see if handrails are loose or broken. Make sure that both sides of any steps have handrails. Any raised decks and porches should have guardrails on the edges. Have any leaves, snow, or ice cleared regularly. Use sand or salt on walking paths during winter. Clean up any spills in your garage right away. This includes oil or grease spills. What can I do in the bathroom? Use night lights. Install grab bars by the toilet and in the tub and shower. Do not use towel bars as grab bars. Use non-skid mats or decals in the tub or shower. If you need to sit down in the shower, use a plastic, non-slip stool. Keep the floor dry. Clean up any water that spills on the floor as soon as it happens. Remove soap buildup in the tub or shower regularly. Attach bath mats securely with double-sided non-slip rug tape. Do not have throw rugs and other things on the floor that can make you trip. What can I do in the bedroom? Use night lights. Make sure that you have a  light by your bed that is easy to reach. Do not use any sheets or blankets that are too big for your bed. They should not hang down onto the floor. Have a firm chair that has side arms. You can use this for support while you get dressed. Do not have throw rugs and other things on the floor that can make you trip. What can I do in the kitchen? Clean up any spills right away. Avoid walking on wet floors. Keep items that you use a lot in easy-to-reach places. If you need to reach something above you, use a strong step stool that has a grab bar. Keep electrical cords out of the way. Do not use floor polish or wax that makes floors slippery. If you must use wax, use non-skid floor wax. Do not have throw rugs and other things on the floor that can make you trip. What can I do with my stairs? Do not leave any items on the stairs. Make sure that there are handrails on both sides of the stairs and use them. Fix handrails that are broken or loose. Make sure that handrails are as long as the stairways. Check any carpeting to make sure that it is firmly attached to the stairs. Fix any carpet that is loose or worn. Avoid having throw rugs at the top or bottom of the  stairs. If you do have throw rugs, attach them to the floor with carpet tape. Make sure that you have a light switch at the top of the stairs and the bottom of the stairs. If you do not have them, ask someone to add them for you. What else can I do to help prevent falls? Wear shoes that: Do not have high heels. Have rubber bottoms. Are comfortable and fit you well. Are closed at the toe. Do not wear sandals. If you use a stepladder: Make sure that it is fully opened. Do not climb a closed stepladder. Make sure that both sides of the stepladder are locked into place. Ask someone to hold it for you, if possible. Clearly mark and make sure that you can see: Any grab bars or handrails. First and last steps. Where the edge of each step  is. Use tools that help you move around (mobility aids) if they are needed. These include: Canes. Walkers. Scooters. Crutches. Turn on the lights when you go into a dark area. Replace any light bulbs as soon as they burn out. Set up your furniture so you have a clear path. Avoid moving your furniture around. If any of your floors are uneven, fix them. If there are any pets around you, be aware of where they are. Review your medicines with your doctor. Some medicines can make you feel dizzy. This can increase your chance of falling. Ask your doctor what other things that you can do to help prevent falls. This information is not intended to replace advice given to you by your health care provider. Make sure you discuss any questions you have with your health care provider. Document Released: 01/14/2009 Document Revised: 08/26/2015 Document Reviewed: 04/24/2014 Elsevier Interactive Patient Education  2017 [employer].

## 2021-01-13 NOTE — Progress Notes (Signed)
This service is provided via telemedicine  No vital signs collected/recorded due to the encounter was a telemedicine visit.   Location of patient (ex: home, work):  Home  Patient consents to a telephone visit:  Yes, see encounter dated 01/13/2021  Location of the provider (ex: office, home):  [place]  Name of any referring provider:  N/A  Names of all persons participating in the telemedicine service and their role in the encounter:  Sherrie Mustache, Nurse Practitioner, Carroll Kinds, CMA, and patient.   Time spent on call:  15 minutes with medical assistant

## 2021-01-17 ENCOUNTER — Other Ambulatory Visit (HOSPITAL_COMMUNITY): Payer: Self-pay

## 2021-01-18 DIAGNOSIS — L97521 Non-pressure chronic ulcer of other part of left foot limited to breakdown of skin: Secondary | ICD-10-CM | POA: Diagnosis not present

## 2021-01-18 DIAGNOSIS — M869 Osteomyelitis, unspecified: Secondary | ICD-10-CM | POA: Diagnosis not present

## 2021-01-18 DIAGNOSIS — E785 Hyperlipidemia, unspecified: Secondary | ICD-10-CM | POA: Diagnosis not present

## 2021-01-18 DIAGNOSIS — E11621 Type 2 diabetes mellitus with foot ulcer: Secondary | ICD-10-CM | POA: Diagnosis not present

## 2021-01-18 DIAGNOSIS — L97411 Non-pressure chronic ulcer of right heel and midfoot limited to breakdown of skin: Secondary | ICD-10-CM | POA: Diagnosis not present

## 2021-01-18 DIAGNOSIS — E1169 Type 2 diabetes mellitus with other specified complication: Secondary | ICD-10-CM | POA: Diagnosis not present

## 2021-01-19 ENCOUNTER — Encounter: Payer: Self-pay | Admitting: *Deleted

## 2021-01-19 ENCOUNTER — Other Ambulatory Visit: Payer: Self-pay

## 2021-01-19 MED ORDER — CLOPIDOGREL BISULFATE 75 MG PO TABS
75.0000 mg | ORAL_TABLET | Freq: Every day | ORAL | 3 refills | Status: DC
Start: 2021-01-19 — End: 2021-02-16

## 2021-01-19 NOTE — Telephone Encounter (Signed)
Request refill on medication 

## 2021-01-19 NOTE — Telephone Encounter (Signed)
This encounter was created in error - please disregard.

## 2021-01-20 DIAGNOSIS — E11621 Type 2 diabetes mellitus with foot ulcer: Secondary | ICD-10-CM | POA: Diagnosis not present

## 2021-01-20 DIAGNOSIS — E785 Hyperlipidemia, unspecified: Secondary | ICD-10-CM | POA: Diagnosis not present

## 2021-01-20 DIAGNOSIS — E1169 Type 2 diabetes mellitus with other specified complication: Secondary | ICD-10-CM | POA: Diagnosis not present

## 2021-01-20 DIAGNOSIS — L97521 Non-pressure chronic ulcer of other part of left foot limited to breakdown of skin: Secondary | ICD-10-CM | POA: Diagnosis not present

## 2021-01-20 DIAGNOSIS — M869 Osteomyelitis, unspecified: Secondary | ICD-10-CM | POA: Diagnosis not present

## 2021-01-20 DIAGNOSIS — L97411 Non-pressure chronic ulcer of right heel and midfoot limited to breakdown of skin: Secondary | ICD-10-CM | POA: Diagnosis not present

## 2021-01-24 ENCOUNTER — Other Ambulatory Visit: Payer: Self-pay | Admitting: *Deleted

## 2021-01-24 MED ORDER — HYDRALAZINE HCL 50 MG PO TABS: 50.0000 mg | ORAL_TABLET | Freq: Three times a day (TID) | ORAL | 0 refills | Status: DC

## 2021-01-25 ENCOUNTER — Ambulatory Visit (HOSPITAL_COMMUNITY)
Admission: RE | Admit: 2021-01-25 | Discharge: 2021-01-25 | Disposition: A | Discharge status: Home / Self Care | Payer: Medicare Other | Source: Ambulatory Visit | Attending: Vascular Surgery | Admitting: Vascular Surgery

## 2021-01-25 ENCOUNTER — Other Ambulatory Visit: Payer: Self-pay

## 2021-01-25 ENCOUNTER — Ambulatory Visit (INDEPENDENT_AMBULATORY_CARE_PROVIDER_SITE_OTHER): Payer: Medicare Other | Admitting: Physician Assistant

## 2021-01-25 VITALS — BP 151/66 | HR 90 | Temp 97.6°F | Ht 66.0 in | Wt 125.5 lb

## 2021-01-25 DIAGNOSIS — I70245 Atherosclerosis of native arteries of left leg with ulceration of other part of foot: Secondary | ICD-10-CM | POA: Diagnosis not present

## 2021-01-25 NOTE — Progress Notes (Signed)
Office Note     CC:  follow up Requesting Provider:  Sharon Seller, NP  HPI: Shannon [name] is a 87 y.o. ([birth year]) female who presents for follow-up status post right lower extremity angiogram with second-order cannulation secondary to history of atherosclerosis of native arteries of right lower extremity causing ulceration and osteomyelitis.  Performed by Dr. Lenell Antu on December 24, 2020.  She had undergone angiography in July with treatment of left popliteal lesion.  She required right common femoral artery repair due to hematoma.  She underwent irrigation and debridement of bilateral foot ulcers by Dr. Ardelle Anton on December 28, 2020. She continues to received wound care to right foot ulcer.  Previous right TMA.  She is compliant with aspirin, statin and Plavix.   Past Medical History:  Diagnosis Date   Acute respiratory failure (HCC)    Anemia    previous blood transfusions   Arthritis    "all over"   Asthma    Atrial flutter (HCC)    Bradycardia    requiring previous d/c of BB and reduction of amiodarone   CAD (coronary artery disease)    nonobstructive per notes   Chronic diastolic CHF (congestive heart failure) (HCC)    CKD (chronic kidney disease), stage III (HCC)    Complication of blood transfusion    "got the wrong blood type at [facility] in ~ 2015; no adverse reaction that we are aware of"/daughter, [name] (01/27/2016)   COPD (chronic obstructive pulmonary disease) (HCC)    Depression    "light case"   DVT (deep venous thrombosis) (HCC) 01/2016   a. LLE DVT 01/2016 - switched from Eliquis to Coumadin.   Dyspnea    with some activity   Gastric stenosis    a. s/p stomach tube   GERD (gastroesophageal reflux disease)    Headache    History of 2019 novel coronavirus disease (COVID-19)    History of blood transfusion    "several" (01/27/2016)   History of stomach ulcers    Hyperlipidemia    Hypertension    Hypothyroidism    On home oxygen therapy    "2L;  9PM til 9AM" (06/27/2017)   PAD (peripheral artery disease) (HCC)    a. prior gangrene, toe amputations, intervention.   PAF (paroxysmal atrial fibrillation) (HCC)    Paraesophageal hernia    Perforated gastric ulcer (HCC)    Pneumonia    "a few times" (06/27/2017)   Seasonal allergies    SIADH (syndrome of inappropriate ADH production) (HCC)    [name] 01/10/2015   Small bowel obstruction (HCC)    "I don't know how many" (01/11/2015)   Stroke (HCC)    several- no residual   Type II diabetes mellitus (HCC)    "related to prednisone use  for > 20 yr; once predinose stopped; no more DM RX" (01/27/2016)   UTI (urinary tract infection) 02/08/2016   Ventral hernia with bowel obstruction     Past Surgical History:  Procedure Laterality Date   ABDOMINAL AORTOGRAM N/A 09/21/2016   Procedure: Abdominal Aortogram;  Surgeon: Maeola Harman, MD;  Location: [facility] INVASIVE CV LAB;  Service: Cardiovascular;  Laterality: N/A;   ABDOMINAL AORTOGRAM W/LOWER EXTREMITY N/A 10/20/2020   Procedure: ABDOMINAL AORTOGRAM W/LOWER EXTREMITY;  Surgeon: Leonie Douglas, MD;  Location: [facility] INVASIVE CV LAB;  Service: Cardiovascular;  Laterality: N/A;   AMPUTATION Left 09/25/2016   Procedure: AMPUTATION DIGIT- LEFT 2ND AND 3RD TOES;  Surgeon: Larina Earthly, MD;  Location: [facility]  OR;  Service: Vascular;  Laterality: Left;   AMPUTATION Right 05/03/2018   Procedure: AMPUTATION RIGHT GREAT TOE;  Surgeon: Larina Earthly, MD;  Location: [facility] OR;  Service: Vascular;  Laterality: Right;   CATARACT EXTRACTION W/ INTRAOCULAR LENS  IMPLANT, BILATERAL     CHOLECYSTECTOMY OPEN     COLECTOMY     ESOPHAGOGASTRODUODENOSCOPY N/A 01/19/2014   Procedure: ESOPHAGOGASTRODUODENOSCOPY (EGD);  Surgeon: Hilarie Fredrickson, MD;  Location: [facility] ENDOSCOPY;  Service: Endoscopy;  Laterality: N/A;   ESOPHAGOGASTRODUODENOSCOPY N/A 01/20/2014   Procedure: ESOPHAGOGASTRODUODENOSCOPY (EGD);  Surgeon: Hilarie Fredrickson, MD;  Location: [facility] ENDOSCOPY;  Service:  Endoscopy;  Laterality: N/A;   ESOPHAGOGASTRODUODENOSCOPY N/A 03/19/2014   Procedure: ESOPHAGOGASTRODUODENOSCOPY (EGD);  Surgeon: Rachael Fee, MD;  Location: [facility] ENDOSCOPY;  Service: Endoscopy;  Laterality: N/A;   ESOPHAGOGASTRODUODENOSCOPY N/A 07/08/2015   Procedure: ESOPHAGOGASTRODUODENOSCOPY (EGD);  Surgeon: Sherrilyn Rist, MD;  Location: [facility] ENDOSCOPY;  Service: Endoscopy;  Laterality: N/A;   ESOPHAGOGASTRODUODENOSCOPY (EGD) WITH PROPOFOL N/A 09/15/2015   Procedure: ESOPHAGOGASTRODUODENOSCOPY (EGD) WITH PROPOFOL;  Surgeon: Ruffin Frederick, MD;  Location: [facility] ENDOSCOPY;  Service: Gastroenterology;  Laterality: N/A;   ESOPHAGOGASTRODUODENOSCOPY (EGD) WITH PROPOFOL N/A 10/12/2020   Procedure: ESOPHAGOGASTRODUODENOSCOPY (EGD) WITH PROPOFOL;  Surgeon: Iva Boop, MD;  Location: [facility] ENDOSCOPY;  Service: Endoscopy;  Laterality: N/A;   FOOT SURGERY     GASTROJEJUNOSTOMY     hx/notes 01/10/2015   GASTROJEJUNOSTOMY N/A 09/23/2015   Procedure: OPEN GASTROJEJUNOSTOMY TUBE PLACEMENT;  Surgeon: De Blanch Kinsinger, MD;  Location: [facility] ORS;  Service: General;  Laterality: N/A;   GLAUCOMA SURGERY Bilateral    HEMATOMA EVACUATION Right 10/20/2020   Procedure: EVACUATION HEMATOMA RIGHT GROIN;  Surgeon: Leonie Douglas, MD;  Location: [facility] OR;  Service: Vascular;  Laterality: Right;   IR CM INJ ANY COLONIC TUBE W/FLUORO  01/05/2017   IR CM INJ ANY COLONIC TUBE W/FLUORO  06/07/2017   IR CM INJ ANY COLONIC TUBE W/FLUORO  11/05/2017   IR CM INJ ANY COLONIC TUBE W/FLUORO  09/18/2018   IR CM INJ ANY COLONIC TUBE W/FLUORO  03/06/2019   IR CM INJ ANY COLONIC TUBE W/FLUORO  03/11/2019   IR GASTR TUBE CONVERT GASTR-JEJ PER W/FL MOD SED  04/30/2020   IR GENERIC HISTORICAL  01/07/2016   IR GJ TUBE CHANGE 01/07/2016 Malachy Moan, MD [facility]   IR GENERIC HISTORICAL  01/27/2016   IR MECH REMOV OBSTRUC MAT ANY COLON TUBE W/FLUORO 01/27/2016 Richarda Overlie, MD [facility]   IR GENERIC HISTORICAL   02/07/2016   IR PATIENT EVAL TECH 0-60 MINS Darrell K Allred, PA-C [facility]   IR GENERIC HISTORICAL  02/08/2016   IR GJ TUBE CHANGE 02/08/2016 Berdine Dance, MD [facility]   IR GENERIC HISTORICAL  01/06/2016   IR GJ TUBE CHANGE 01/06/2016 [facility] OUT REF   IR GENERIC HISTORICAL  05/02/2016   IR CM INJ ANY COLONIC TUBE W/FLUORO 05/02/2016 Oley Balm, MD [facility]   IR GENERIC HISTORICAL  05/15/2016   IR GJ TUBE CHANGE 05/15/2016 Simonne Come, MD [facility]   IR GENERIC HISTORICAL  06/28/2016   IR GJ TUBE CHANGE 06/28/2016 [facility]   IR GJ TUBE CHANGE  02/20/2017   IR GJ TUBE CHANGE  05/10/2017   IR GJ TUBE CHANGE  07/06/2017   IR GJ TUBE CHANGE  08/02/2017   IR GJ TUBE CHANGE  10/15/2017   IR GJ TUBE CHANGE  12/26/2017   IR GJ TUBE CHANGE  04/08/2018   IR  GJ TUBE CHANGE  06/19/2018   IR GJ TUBE CHANGE  03/03/2019   IR GJ TUBE CHANGE  06/06/2019   IR GJ TUBE CHANGE  09/26/2019   IR GJ TUBE CHANGE  01/09/2020   IR GJ TUBE CHANGE  05/12/2020   IR GJ TUBE CHANGE  10/05/2020   IR GJ TUBE CHANGE  10/14/2020   IR GJ TUBE CHANGE  10/19/2020   IR PATIENT EVAL TECH 0-60 MINS  10/19/2016   IR PATIENT EVAL TECH 0-60 MINS  12/25/2016   IR PATIENT EVAL TECH 0-60 MINS  01/29/2017   IR PATIENT EVAL TECH 0-60 MINS  04/04/2017   IR PATIENT EVAL TECH 0-60 MINS  04/30/2017   IR PATIENT EVAL TECH 0-60 MINS  07/31/2017   IR REPLC DUODEN/JEJUNO TUBE PERCUT W/FLUORO  11/14/2016   IRRIGATION AND DEBRIDEMENT FOOT Bilateral 12/26/2020   Procedure: IRRIGATION AND DEBRIDEMENT FOOT AND BONE BIOPSY;  Surgeon: Vivi Barrack, DPM;  Location: [facility] OR;  Service: Podiatry;  Laterality: Bilateral;   LAPAROTOMY N/A 01/20/2015   Procedure: EXPLORATORY LAPAROTOMY;  Surgeon: Abigail Miyamoto, MD;  Location: [facility] OR;  Service: General;  Laterality: N/A;   LOWER EXTREMITY ANGIOGRAPHY Left 09/21/2016   Procedure: Lower Extremity Angiography;  Surgeon: Maeola Harman, MD;  Location: [facility]  INVASIVE CV LAB;  Service: Cardiovascular;  Laterality: Left;   LOWER EXTREMITY ANGIOGRAPHY Right 06/27/2017   Procedure: Lower Extremity Angiography;  Surgeon: Maeola Harman, MD;  Location: [facility] INVASIVE CV LAB;  Service: Cardiovascular;  Laterality: Right;   LOWER EXTREMITY ANGIOGRAPHY Right 12/24/2020   Procedure: LOWER EXTREMITY ANGIOGRAPHY;  Surgeon: Leonie Douglas, MD;  [facility] INVASIVE CV LAB;  Service: Cardiovascular;  Laterality: Right;   LYSIS OF ADHESION N/A 01/20/2015   Procedure: LYSIS OF ADHESIONS < 1 HOUR;  Surgeon: Abigail Miyamoto, MD;  Location: [facility] OR;  Service: General;  Laterality: N/A;   PERIPHERAL VASCULAR BALLOON ANGIOPLASTY Left 09/21/2016   Procedure: Peripheral Vascular Balloon Angioplasty;  Surgeon: Maeola Harman, MD;  Location: [facility] INVASIVE CV LAB;  Service: Cardiovascular;  Laterality: Left;  drug coated balloon   PERIPHERAL VASCULAR BALLOON ANGIOPLASTY Right 06/27/2017   Procedure: PERIPHERAL VASCULAR BALLOON ANGIOPLASTY;  Surgeon: Maeola Harman, MD;  Location: [facility] INVASIVE CV LAB;  Service: Cardiovascular;  Laterality: Right;  SFA/[id number]   PERIPHERAL VASCULAR INTERVENTION Left 10/20/2020   Procedure: PERIPHERAL VASCULAR INTERVENTION;  Surgeon: Leonie Douglas, MD;  [facility]: [facility] INVASIVE CV LAB;  Service: Cardiovascular;  Laterality: Left;   TONSILLECTOMY     TRANSMETATARSAL AMPUTATION Right 11/14/2019   Procedure: TRANSMETATARSAL AMPUTATION RIGHT FOOT;  Surgeon: Felecia Shelling, DPM;  Location: [facility] OR;  Service: Podiatry;  Laterality: Right;   TUBAL LIGATION     VENTRAL HERNIA REPAIR  2015   incarcerated ventral hernia ([facility] 09/2013)/notes 01/10/2015   WOUND EXPLORATION Right 10/20/2020   Procedure: PRIMARY CLOSURE COMMON FEMORAL ARTERY;  Surgeon: Leonie Douglas, MD;  Location: [facility] OR;  Service: Vascular;  Laterality: Right;    Social History   Socioeconomic History   Marital status: Widowed    Spouse name: Not on file    Number of children: 3   Years of education: Not on file   Highest education level: Not on file  Occupational History   Not on file  Tobacco Use   Smoking status: Never   Smokeless tobacco: Former    Types: Snuff    Quit date: 04/03/1980   Tobacco comments:    "used snuff in my younger  days"  Vaping Use   Vaping Use: Never used  Substance and Sexual Activity   Alcohol use: Never    Alcohol/week: 0.0 standard drinks   Drug use: Never   Sexual activity: Not Currently  Other Topics Concern   Not on file  Social History Narrative   Married in 1947, lives in one story house with 2 people and no petsOccupation: ?Has a living Will, [place], and doesn't have DNRWas living with Husband (in frail health) in [place] until 09/2013.  After her [facility] admission they both came to live with daughter in [place]   Widowed   Social Determinants of Health   Financial Resource Strain: Not on file  Food Insecurity: Not on file  Transportation Needs: Not on file  Physical Activity: Not on file  Stress: Not on file  Social Connections: Not on file  Intimate Partner Violence: Not on file   Family History  Problem Relation Age of Onset   Stroke Mother    Hypertension Mother    Diabetes Brother    Glaucoma Son    Glaucoma Son    Heart attack Neg Hx    Colon cancer Neg Hx    Stomach cancer Neg Hx     Current Outpatient Medications  Medication Sig Dispense Refill   acetaminophen (TYLENOL) 500 MG tablet Take 500 mg by mouth every 6 (six) hours as needed for moderate pain.     amLODipine (NORVASC) 5 MG tablet TAKE 1 TABLET BY MOUTH EVERY DAY 90 tablet 1   amoxicillin-clavulanate (AUGMENTIN) 500-125 MG tablet Take 1 tablet (500 mg total) by mouth 2 (two) times daily for 28 days. 56 tablet 0   aspirin 81 MG EC tablet Take 1 tablet (81 mg total) by mouth daily. Swallow whole. 150 tablet 2   atorvastatin (LIPITOR) 10 MG tablet TAKE 1 TABLET BY MOUTH EVERY DAY 90 tablet 1   b complex vitamins tablet  Take 1 tablet by mouth daily.      cholecalciferol (VITAMIN D3) 25 MCG (1000 UNIT) tablet Take 1,000 Units by mouth daily.     clopidogrel (PLAVIX) 75 MG tablet Take 1 tablet (75 mg total) by mouth daily. 90 tablet 3   cycloSPORINE (RESTASIS) 0.05 % ophthalmic emulsion USE 1 DROP INTO BOTH EYES TWICE DAILY 60 mL 3   DULoxetine (CYMBALTA) 30 MG capsule TAKE 1 CAPSULE BY MOUTH EVERY DAY 90 capsule 2   ferrous sulfate 220 (44 Fe) MG/5ML solution TAKE 7.4 MLS (325 MG TOTAL) BY MOUTH DAILY. 150 mL 11   fluticasone (FLONASE) 50 MCG/ACT nasal spray 2 sprays into each nostril daily. 48 mL 2   furosemide (LASIX) 40 MG tablet TAKE 0.5-1 TABLETS (20-40 MG TOTAL) BY MOUTH SEE ADMIN INSTRUCTIONS. TAKE 20 MG DAILY, MAY INSTEAD TAKE 40 MG DAILY AS NEEDED FOR EDEMA 30 tablet 8   gabapentin (NEURONTIN) 300 MG capsule TAKE 1 CAPSULE BY MOUTH TWICE A DAY 180 capsule 1   gentamicin cream (GARAMYCIN) 0.1 % Apply 1 application topically daily as needed (dry skin). 30 g 1   hydrALAZINE (APRESOLINE) 50 MG tablet Take 1 tablet (50 mg total) by mouth 3 (three) times daily. 270 tablet 0   Insulin Pen Needle (B-D UF III MINI PEN NEEDLES) 31G X 5 MM MISC Use twice daily with giving insulin injections. Dx E11.9 300 each 3   ipratropium-albuterol (DUONEB) 0.5-2.5 (3) MG/3ML SOLN Take 3 mLs by nebulization every 6 (six) hours as needed (shortness of breath).     latanoprost (  XALATAN) 0.005 % ophthalmic solution Place 1 drop into both eyes at bedtime.     levalbuterol (XOPENEX HFA) 45 MCG/ACT inhaler Inhale 2 puffs into the lungs every 6 (six) hours as needed for wheezing.     levothyroxine (SYNTHROID) 125 MCG tablet TAKE 1 TABLET BY MOUTH EVERY DAY 90 tablet 0   loratadine (CLARITIN) 10 MG tablet Take 10 mg by mouth daily.     mometasone-formoterol (DULERA) 200-5 MCG/ACT AERO INHALE TWO PUFFS BY MOUTH TWICE DAILY 13 each 5   Multiple Vitamin (MULTIVITAMIN WITH MINERALS) TABS tablet Take 1 tablet by mouth daily.     Nutritional  Supplements (FEEDING SUPPLEMENT, OSMOLITE 1.5 CAL,) LIQD Place 711 mLs into feeding tube See admin instructions. Take 711 ml over a 12 hour period per day 9 am-9 pm at a rate of 65     nystatin cream (MYCOSTATIN) APPLY TO AFFECTED AREA TWICE A DAY 30 g 11   oxyCODONE (ROXICODONE) 5 MG immediate release tablet Take 0.5-1 tablets (2.5-5 mg total) by mouth every 8 (eight) hours as needed for severe pain. 30 tablet 0   pantoprazole (PROTONIX) 40 MG tablet Take 1 tablet (40 mg total) by mouth 2 (two) times daily. 180 tablet 2   Polyethyl Glycol-Propyl Glycol (SYSTANE) 0.4-0.3 % GEL ophthalmic gel Place 1 application into both eyes 2 (two) times daily as needed (moisture).     Probiotic Product (EQL PROBIOTIC COLON SUPPORT PO) Take 1 capsule by mouth daily.     senna-docusate (SENOKOT-S) 8.6-50 MG tablet Take 1 tablet by mouth at bedtime.     shark liver oil-cocoa butter (PREPARATION H) 0.25-3-85.5 % suppository Place 1 suppository rectally as needed for hemorrhoids.     traMADol (ULTRAM) 50 MG tablet Place 1 tablet (50 mg total) into feeding tube every 6 (six) hours as needed for severe pain. 30 tablet 0   Water For Irrigation, Sterile (FREE WATER) SOLN Place 180 mLs into feeding tube 3 (three) times daily.     White Petrolatum-Mineral Oil (SYSTANE NIGHTTIME) OINT Place 1 application into both eyes at bedtime.      No current facility-administered medications for this visit.    No Active Allergies   REVIEW OF SYSTEMS:   [X]  denotes positive finding, [ ]  denotes negative finding Cardiac  Comments:  Chest pain or chest pressure:    Shortness of breath upon exertion:    Short of breath when lying flat:    Irregular heart rhythm:        Vascular    Pain in calf, thigh, or hip brought on by ambulation:    Pain in feet at night that wakes you up from your sleep:     Blood clot in your veins:    Leg swelling:         Pulmonary    Oxygen at home:    Productive cough:     Wheezing:          Neurologic    Sudden weakness in arms or legs:     Sudden numbness in arms or legs:     Sudden onset of difficulty speaking or slurred speech:    Temporary loss of vision in one eye:     Problems with dizziness:         Gastrointestinal    Blood in stool:     Vomited blood:         Genitourinary    Burning when urinating:     Blood in urine:  Psychiatric    Major depression:         Hematologic    Bleeding problems:    Problems with blood clotting too easily:        Skin    Rashes or ulcers: x       Constitutional    Fever or chills:      PHYSICAL EXAMINATION:  Vitals:   01/25/21 1556  BP: (!) 151/66  Pulse: 90  Temp: 97.6 F (36.4 C)  SpO2: 96%     General:  WDWN in NAD; vital signs documented above Gait: Not observed HENT: WNL, normocephalic Pulmonary: normal non-labored breathing  Cardiac: irregular HR, regular rate Skin: without rashes Vascular Exam/Pulses: Brisk Doppler signals of PT, DP and peroneal arteries bilaterally Extremities: without ischemic changes, without Gangrene , without cellulitis; with open wounds; see photo Musculoskeletal: no muscle wasting or atrophy  Neurologic: A&O X 3;  No focal weakness or paresthesias are detected Psychiatric:  The pt has Normal affect.  Left 1st metatarsal head   Right TMA    OPERATIVE FINDINGS: 12/24/2020   Right lower extremity: Common femoral artery: widely patent without flow limiting stenosis  Profunda femoris artery:  widely patent without flow limiting stenosis  Superficial femoral artery: Diffusely diseased, approximately 50% calcified stenosis Popliteal artery: diffusely diseased Anterior tibial artery: occluded Tibioperoneal trunk: widely patent without flow limiting stenosis Peroneal artery: widely patent without flow limiting stenosis  Posterior tibial artery: occluded Pedal circulation: fills via robust collateralization from peroneal   Non-Invasive Vascular Imaging:    01/25/2021 ABIs: ABI/TBIToday's ABI     Today's TBIPrevious ABI    Previous TBI  +-------+----------------+-----------+----------------+------------+  Right  0.86            amputation 1.06            amputation    +-------+----------------+-----------+----------------+------------+  Left   Non compressible0.38       Non compressiblenot obtained  +-------+----------------+-----------+----------------+------------+     Right ABIs appear decreased compared to prior study on 11/16/2020. Left  ABIs appear essentially unchanged compared to prior study on 11/16/2020.     Summary:  Right: Resting right ankle-brachial index indicates mild right lower  extremity arterial disease.   Left: Resting left ankle-brachial index indicates noncompressible left  lower extremity arteries. The left toe-brachial index is abnormal.   *See table(s) above for measurements and observations.      Preliminary     ASSESSMENT/PLAN:: 87 y.o. female here for follow up for right lower extremity arteriography secondary to chronic right foot wound with osteomyelitis.  No intervention performed.  She had previously undergone left popliteal angioplasty several months earlier. No reports of claudication or rest pain. TMA ulcer management per podiatry. Continue asa, statin and Plavix. Follow-up with bilateral arterial duplex in 6 months.   Milinda Antis, PA-C [facility] [phone]  Clinic MD:   [facility] [employer]

## 2021-01-26 ENCOUNTER — Other Ambulatory Visit: Payer: Self-pay | Admitting: *Deleted

## 2021-01-26 ENCOUNTER — Other Ambulatory Visit: Payer: Self-pay

## 2021-01-26 DIAGNOSIS — I70245 Atherosclerosis of native arteries of left leg with ulceration of other part of foot: Secondary | ICD-10-CM

## 2021-01-26 NOTE — Patient Outreach (Signed)
[facility]) Care Management  01/26/2021  OSHYN METRO [birth year] [id number]   Outgoing call placed to member.  State she is "good"  feeling better than a few weeks ago.  Denies any urgent concerns, encouraged to contact this care manager with questions.  Agrees to follow up within the next month.   Goals Addressed             This Visit's Progress    [name] - Make and Keep All Appointments   On track      Timeframe:  Short-Term Goal Priority:  Medium Start Date:       9/15      Expected End Date:     11/15 (goal reset)  Barriers: Knowledge                  - ask family or friend for a ride - call to cancel if needed - keep a calendar with appointment dates    Why is this important?   Part of staying healthy is seeing the doctor for follow-up care.  If you forget your appointments, there are some things you can do to stay on track.    Notes:   11/22 - Upcoming appointments reviewed with member  12/21 - reminded of follow up with podiatrist  2/7 - Reviewed upcoming appointments with member, PCP on 3/9, podiatrist on 3/16  4/6 - Encouraged to follow up with GI regarding feeding tube and test results  5/4 - Continues to have issues with feet, particularly left foot bunion.  Appointment with podiatrist on 5/18, will continue to use cream and contact provider if in need of sooner visit  6/7 - follow up with podiatrist tomorrow and 6/22.  Still has hardened area on right foot and cellulitis on left leg/foot.  Appointment with PCP on 7/11  6/29 - attended 6/22 podiatry appointment, will need another surgery.  PCP appointment 6/23 clearing her for surgery.  Awaiting call from vascular specialist for appointment prior to surgery date   7/15 - Was seen by vascular surgeon on 7/5, will have intervention next week.  Hospital follow up with PCP later today.  7/25 - Upcoming visits with PCP on 8/8, Podiatry on 8/15, and vascular on 8/16.   8/17 - Vascular  appointment done 8/16.  Was also seen by podiatry on 8/15, will follow up on 9/12.    9/15 - Podiatry appointment complete, had x-ray done that showed ongoing infection in feet.  Will have MRI on 9/19 as well as appointment with ID on 9/19.    10/3 - Podiatry appointment on 10/12 and vascular on 10/25  10/26 - Seen by vascular yesterday, per member state healing and blood flow is going well, follow up in 6 months.  Will follow up with podiatry on 11/2     [facility] - Perform Foot Care   On track    Barriers: Knowledge   Timeframe:  Long-Range Goal Priority:  High Start Date:       6/7              Expected End Date:    12/15 (goal extended)       - check feet daily for cuts, sores or redness - wash and dry feet carefully every day - wear comfortable, cotton socks - wear comfortable, well-fitting shoes    Why is this important?   Good foot care is very important when you have diabetes.  There are many things you can  do to keep your feet healthy and catch a problem early.    Notes:   11/22 - Reviewed updated wound care per most recent MD visit  12/21 - Discussed ongoing wound care with member  2/7 - Reminded of wound care per podiatrist, encouraged to keep wound clean and dry  6/7 - Seen in ED for cellulitis of left foot/leg, completed course of antibiotics, still has swelling.    6/29 - Patient is now needing transmetatarsal removal of the left foot due to non-healing wound and infection.  Surgery scheduled for 7/8  7/15 - Amputation surgery has been postponed, will now have vascular intervention, scheduled for 7/20.  Member denies questions regarding procedure  7/25 - Vascular procedure completed in effort to not have further amputation.  Will continue daily foot care and follow up  8/17 - Vascular follow up completed yesterday, report surgery was successful, more blood flow noted.  Amputation on right foot has been placed on hold. Still have healing areas on left foot, minimal  drainage  9/15 - Per podiatry appointment, member still has infection in both feet.  She will have MRI on 9/19, also placed back on antibiotics.  Blood sugars are stable, 158 today.  Member aware of importance of glucose management in reference to wound healing.  10/3 - Member has continued foot care but wounds has progressed.  Hospitalized for I&D, now has dressing changes by family and [facility] nurse.  Using wheelchair for mobility, No PT currently due to member's wounds.  State her feet are sore, but not very painful.  She is monitoring blood sugars for optimal wound healing, today was 146.  10/26 - Continues to have home health RN for wound care.  Open area remains on left foot, state right heel continues to drain at times.  Adhering to DM management in effort to promote wound healing, blood sugar today was 141.         Kemper Durie, [place], MSN [facility] Care Management  [facility] Manager [phone]

## 2021-01-27 DIAGNOSIS — L97411 Non-pressure chronic ulcer of right heel and midfoot limited to breakdown of skin: Secondary | ICD-10-CM | POA: Diagnosis not present

## 2021-01-27 DIAGNOSIS — E11621 Type 2 diabetes mellitus with foot ulcer: Secondary | ICD-10-CM | POA: Diagnosis not present

## 2021-01-27 DIAGNOSIS — L97521 Non-pressure chronic ulcer of other part of left foot limited to breakdown of skin: Secondary | ICD-10-CM | POA: Diagnosis not present

## 2021-01-27 DIAGNOSIS — E785 Hyperlipidemia, unspecified: Secondary | ICD-10-CM | POA: Diagnosis not present

## 2021-01-27 DIAGNOSIS — E1169 Type 2 diabetes mellitus with other specified complication: Secondary | ICD-10-CM | POA: Diagnosis not present

## 2021-01-27 DIAGNOSIS — M869 Osteomyelitis, unspecified: Secondary | ICD-10-CM | POA: Diagnosis not present

## 2021-01-29 ENCOUNTER — Other Ambulatory Visit: Payer: Self-pay | Admitting: Nurse Practitioner

## 2021-01-29 DIAGNOSIS — F331 Major depressive disorder, recurrent, moderate: Secondary | ICD-10-CM

## 2021-01-29 DIAGNOSIS — E1142 Type 2 diabetes mellitus with diabetic polyneuropathy: Secondary | ICD-10-CM

## 2021-01-30 ENCOUNTER — Other Ambulatory Visit: Payer: Self-pay | Admitting: Nurse Practitioner

## 2021-01-31 NOTE — Telephone Encounter (Signed)
High alert came up when trying to fill medication. Medication pended and sent to Sherrie Mustache, NP for approval

## 2021-02-01 DIAGNOSIS — L97521 Non-pressure chronic ulcer of other part of left foot limited to breakdown of skin: Secondary | ICD-10-CM | POA: Diagnosis not present

## 2021-02-01 DIAGNOSIS — E1143 Type 2 diabetes mellitus with diabetic autonomic (poly)neuropathy: Secondary | ICD-10-CM | POA: Diagnosis not present

## 2021-02-01 DIAGNOSIS — E1142 Type 2 diabetes mellitus with diabetic polyneuropathy: Secondary | ICD-10-CM | POA: Diagnosis not present

## 2021-02-01 DIAGNOSIS — D631 Anemia in chronic kidney disease: Secondary | ICD-10-CM | POA: Diagnosis not present

## 2021-02-01 DIAGNOSIS — N184 Chronic kidney disease, stage 4 (severe): Secondary | ICD-10-CM | POA: Diagnosis not present

## 2021-02-01 DIAGNOSIS — I5032 Chronic diastolic (congestive) heart failure: Secondary | ICD-10-CM | POA: Diagnosis not present

## 2021-02-01 DIAGNOSIS — M869 Osteomyelitis, unspecified: Secondary | ICD-10-CM | POA: Diagnosis not present

## 2021-02-01 DIAGNOSIS — J449 Chronic obstructive pulmonary disease, unspecified: Secondary | ICD-10-CM | POA: Diagnosis not present

## 2021-02-01 DIAGNOSIS — B952 Enterococcus as the cause of diseases classified elsewhere: Secondary | ICD-10-CM | POA: Diagnosis not present

## 2021-02-01 DIAGNOSIS — Z434 Encounter for attention to other artificial openings of digestive tract: Secondary | ICD-10-CM | POA: Diagnosis not present

## 2021-02-01 DIAGNOSIS — I152 Hypertension secondary to endocrine disorders: Secondary | ICD-10-CM | POA: Diagnosis not present

## 2021-02-01 DIAGNOSIS — M159 Polyosteoarthritis, unspecified: Secondary | ICD-10-CM | POA: Diagnosis not present

## 2021-02-01 DIAGNOSIS — I4892 Unspecified atrial flutter: Secondary | ICD-10-CM | POA: Diagnosis not present

## 2021-02-01 DIAGNOSIS — N179 Acute kidney failure, unspecified: Secondary | ICD-10-CM | POA: Diagnosis not present

## 2021-02-01 DIAGNOSIS — E1122 Type 2 diabetes mellitus with diabetic chronic kidney disease: Secondary | ICD-10-CM | POA: Diagnosis not present

## 2021-02-01 DIAGNOSIS — I70234 Atherosclerosis of native arteries of right leg with ulceration of heel and midfoot: Secondary | ICD-10-CM | POA: Diagnosis not present

## 2021-02-01 DIAGNOSIS — L97411 Non-pressure chronic ulcer of right heel and midfoot limited to breakdown of skin: Secondary | ICD-10-CM | POA: Diagnosis not present

## 2021-02-01 DIAGNOSIS — I2510 Atherosclerotic heart disease of native coronary artery without angina pectoris: Secondary | ICD-10-CM | POA: Diagnosis not present

## 2021-02-01 DIAGNOSIS — E11621 Type 2 diabetes mellitus with foot ulcer: Secondary | ICD-10-CM | POA: Diagnosis not present

## 2021-02-01 DIAGNOSIS — E1159 Type 2 diabetes mellitus with other circulatory complications: Secondary | ICD-10-CM | POA: Diagnosis not present

## 2021-02-01 DIAGNOSIS — E1169 Type 2 diabetes mellitus with other specified complication: Secondary | ICD-10-CM | POA: Diagnosis not present

## 2021-02-01 DIAGNOSIS — E785 Hyperlipidemia, unspecified: Secondary | ICD-10-CM | POA: Diagnosis not present

## 2021-02-01 DIAGNOSIS — E1151 Type 2 diabetes mellitus with diabetic peripheral angiopathy without gangrene: Secondary | ICD-10-CM | POA: Diagnosis not present

## 2021-02-01 DIAGNOSIS — I4819 Other persistent atrial fibrillation: Secondary | ICD-10-CM | POA: Diagnosis not present

## 2021-02-01 DIAGNOSIS — E222 Syndrome of inappropriate secretion of antidiuretic hormone: Secondary | ICD-10-CM | POA: Diagnosis not present

## 2021-02-02 ENCOUNTER — Ambulatory Visit (INDEPENDENT_AMBULATORY_CARE_PROVIDER_SITE_OTHER): Payer: Medicare Other | Admitting: Podiatry

## 2021-02-02 ENCOUNTER — Encounter: Payer: Self-pay | Admitting: Podiatry

## 2021-02-02 ENCOUNTER — Telehealth: Payer: Self-pay | Admitting: *Deleted

## 2021-02-02 ENCOUNTER — Other Ambulatory Visit: Payer: Self-pay

## 2021-02-02 DIAGNOSIS — L97512 Non-pressure chronic ulcer of other part of right foot with fat layer exposed: Secondary | ICD-10-CM | POA: Diagnosis not present

## 2021-02-02 DIAGNOSIS — E0859 Diabetes mellitus due to underlying condition with other circulatory complications: Secondary | ICD-10-CM

## 2021-02-02 DIAGNOSIS — L97522 Non-pressure chronic ulcer of other part of left foot with fat layer exposed: Secondary | ICD-10-CM

## 2021-02-02 NOTE — Telephone Encounter (Signed)
Gaspar Skeeters with [facility] called requesting an extension on patient's Home Health wound Care.   Verbal order given.

## 2021-02-02 NOTE — Progress Notes (Signed)
Subjective:  Patient status post bone biopsy with debridement of wounds to the bilateral feet.  DOS: 12/26/2020.  Patient continues to do well.  She says that the nurse comes to the home and advised the silver alginate dressing.  No new complaints at this time  Past Medical History:  Diagnosis Date   Acute respiratory failure ([place])    Anemia    previous blood transfusions   Arthritis    "all over"   Asthma    Atrial flutter (H[facility])    Bradycardia    requiring previous d/c of BB and reduction of amiodarone   CAD (coronary artery disease)    nonobstructive per notes   Chronic diastolic CHF (congestive heart failure) ([place])    CKD (chronic kidney disease), stage III ([place])    Complication of blood transfusion    "got the wrong blood type at [facility] in ~ 2015; no adverse reaction that we are aware of"/daughter, [name] (01/27/2016)   COPD (chronic obstructive pulmonary disease) ([place])    Depression    "light case"   DVT (deep venous thrombosis) ([place]) 01/2016   a. LLE DVT 01/2016 - switched from Eliquis to Coumadin.   Dyspnea    with some activity   Gastric stenosis    a. s/p stomach tube   GERD (gastroesophageal reflux disease)    Headache    History of 2019 novel coronavirus disease (COVID-19)    History of blood transfusion    "several" (01/27/2016)   History of stomach ulcers    Hyperlipidemia    Hypertension    Hypothyroidism    On home oxygen therapy    "2L; 9PM til 9AM" (06/27/2017)   PAD (peripheral artery disease) (HCC)    a. prior gangrene, toe amputations, intervention.   PAF (paroxysmal atrial fibrillation) ([place])    Paraesophageal hernia    Perforated gastric ulcer ([place])    Pneumonia    "a few times" (06/27/2017)   Seasonal allergies    SIADH (syndrome of inappropriate ADH production) ([place])    [name] 01/10/2015   Small bowel obstruction ([place])    "I don't know how many" (01/11/2015)   Stroke ([place])    several- no residual   Type II diabetes mellitus ([place])     "related to prednisone use  for > 20 yr; once predinose stopped; no more DM RX" (01/27/2016)   UTI (urinary tract infection) 02/08/2016   Ventral hernia with bowel obstruction      Objective: Physical Exam General: The patient is alert and oriented x3 in no acute distress.  Dermatology: Ulcer noted to the medial aspect of the first MTPJ left which appears significantly improved and almost healed.  There is a very superficial dry stable eschar overlying the area.  Ulcer is also noted to the plantar aspect of the right foot with significant improvement.  Two separate wounds underneath the third and fifth distal portions of the metatarsals noted each measuring about 1.0 x 1.0 x 0.3 cm.  Granular with fibrotic wound base.  Moderate serous drainage noted.  Vascular: Patient recently underwent balloon angioplasty with vein and vascular  VAS [place] ABI W/WO TBI 09/10/2020 Right: Resting right ankle-brachial index is within normal range using the  posterior tibial artery . No evidence of significant right lower extremity  arterial disease.   Left: Resting left ankle-brachial index indicates noncompressible left  lower extremity arteries. Unable to obtain left great toe index due to  bandage.   Neurological: Epicritic and protective threshold diminished  bilaterally.   Musculoskeletal Exam: Status post TMA right foot.  Hallux valgus left with overlying ulcer and degenerative changes possible exposed bone and joint capsule.  Second and third toe amputation left.  Radiographic exam today 12/13/2020:  There continues to be left foot demonstration of history of left second and third toe amputation with resection of the metatarsal heads as well and hallux valgus deformity.  Diffuse degenerative changes noted.  No gas within the tissue.  Cortical erosive changes noted to the medial aspect of the first metatarsal head just underlying the area of the wound which is consistent with osteomyelitis of the distal  portion of the first metatarsal.  Right foot demonstrates ulcer at the distal stump of the second metatarsal with some osteolytic changes at the distal portion of the metatarsal consistent with findings of osteomyelitis  Assessment: 1. s/p transmetatarsal amputation right foot. DOS: 11/14/2019 2.  Ulcer right forefoot amputation stump 3.  Hallux valgus left with ulcer down to level bone and joint. H/o 2nd toe amputation left 4.  Osteomyelitis first metatarsal left foot as well as the amputation site of the second metatarsal right foot amputation stump with overlying ulcer  Plan of Care:  1. Patient was evaluated.  The patient continues to do very well with improvement of the wounds clinically.   2.  Medically necessary excisional debridement including subcutaneous tissue was performed using a tissue nipper.  Excisional debridement of all necrotic nonviable tissue down to healthy bleeding viable tissue was performed with postdebridement measurement same as pre- 3.  Continue silver alginate wound dressing which appears to be helping significantly 4.  The patient continues to request conservative treatment only.  We will continue conservative wound care 5.  Return to clinic in 4 weeks for follow-up x-ray and evaluation   Edrick Kins, DPM [facility]  Dr. Edrick Kins, DPM    [address], [place] [address]                Office [phone]  Fax [phone]

## 2021-02-03 DIAGNOSIS — L97411 Non-pressure chronic ulcer of right heel and midfoot limited to breakdown of skin: Secondary | ICD-10-CM | POA: Diagnosis not present

## 2021-02-03 DIAGNOSIS — E785 Hyperlipidemia, unspecified: Secondary | ICD-10-CM | POA: Diagnosis not present

## 2021-02-03 DIAGNOSIS — E1169 Type 2 diabetes mellitus with other specified complication: Secondary | ICD-10-CM | POA: Diagnosis not present

## 2021-02-03 DIAGNOSIS — E11621 Type 2 diabetes mellitus with foot ulcer: Secondary | ICD-10-CM | POA: Diagnosis not present

## 2021-02-03 DIAGNOSIS — M869 Osteomyelitis, unspecified: Secondary | ICD-10-CM | POA: Diagnosis not present

## 2021-02-03 DIAGNOSIS — L97521 Non-pressure chronic ulcer of other part of left foot limited to breakdown of skin: Secondary | ICD-10-CM | POA: Diagnosis not present

## 2021-02-14 ENCOUNTER — Encounter (HOSPITAL_COMMUNITY): Payer: Self-pay | Admitting: Emergency Medicine

## 2021-02-14 ENCOUNTER — Ambulatory Visit: Payer: Medicare Other | Admitting: Orthopedic Surgery

## 2021-02-14 ENCOUNTER — Emergency Department (HOSPITAL_COMMUNITY): Payer: Medicare Other

## 2021-02-14 ENCOUNTER — Other Ambulatory Visit: Payer: Self-pay

## 2021-02-14 ENCOUNTER — Emergency Department (HOSPITAL_BASED_OUTPATIENT_CLINIC_OR_DEPARTMENT_OTHER): Payer: Medicare Other

## 2021-02-14 ENCOUNTER — Inpatient Hospital Stay (HOSPITAL_COMMUNITY)
Admission: EM | Admit: 2021-02-14 | Discharge: 2021-02-16 | DRG: 369 | Disposition: A | Discharge status: Home / Self Care | Payer: Medicare Other | Attending: Internal Medicine | Admitting: Internal Medicine

## 2021-02-14 DIAGNOSIS — I639 Cerebral infarction, unspecified: Secondary | ICD-10-CM | POA: Diagnosis not present

## 2021-02-14 DIAGNOSIS — I10 Essential (primary) hypertension: Secondary | ICD-10-CM

## 2021-02-14 DIAGNOSIS — I70269 Atherosclerosis of native arteries of extremities with gangrene, unspecified extremity: Secondary | ICD-10-CM | POA: Diagnosis present

## 2021-02-14 DIAGNOSIS — D62 Acute posthemorrhagic anemia: Secondary | ICD-10-CM | POA: Diagnosis not present

## 2021-02-14 DIAGNOSIS — R531 Weakness: Secondary | ICD-10-CM

## 2021-02-14 DIAGNOSIS — L97529 Non-pressure chronic ulcer of other part of left foot with unspecified severity: Secondary | ICD-10-CM | POA: Diagnosis present

## 2021-02-14 DIAGNOSIS — E11621 Type 2 diabetes mellitus with foot ulcer: Secondary | ICD-10-CM | POA: Diagnosis present

## 2021-02-14 DIAGNOSIS — I130 Hypertensive heart and chronic kidney disease with heart failure and stage 1 through stage 4 chronic kidney disease, or unspecified chronic kidney disease: Secondary | ICD-10-CM | POA: Diagnosis present

## 2021-02-14 DIAGNOSIS — D649 Anemia, unspecified: Secondary | ICD-10-CM | POA: Diagnosis not present

## 2021-02-14 DIAGNOSIS — K769 Liver disease, unspecified: Secondary | ICD-10-CM | POA: Diagnosis not present

## 2021-02-14 DIAGNOSIS — I5032 Chronic diastolic (congestive) heart failure: Secondary | ICD-10-CM | POA: Diagnosis present

## 2021-02-14 DIAGNOSIS — R1012 Left upper quadrant pain: Secondary | ICD-10-CM

## 2021-02-14 DIAGNOSIS — Z66 Do not resuscitate: Secondary | ICD-10-CM | POA: Diagnosis present

## 2021-02-14 DIAGNOSIS — Z20822 Contact with and (suspected) exposure to covid-19: Secondary | ICD-10-CM | POA: Diagnosis present

## 2021-02-14 DIAGNOSIS — K2101 Gastro-esophageal reflux disease with esophagitis, with bleeding: Principal | ICD-10-CM | POA: Diagnosis present

## 2021-02-14 DIAGNOSIS — I70262 Atherosclerosis of native arteries of extremities with gangrene, left leg: Secondary | ICD-10-CM

## 2021-02-14 DIAGNOSIS — R52 Pain, unspecified: Secondary | ICD-10-CM

## 2021-02-14 DIAGNOSIS — N179 Acute kidney failure, unspecified: Secondary | ICD-10-CM | POA: Diagnosis present

## 2021-02-14 DIAGNOSIS — Z8249 Family history of ischemic heart disease and other diseases of the circulatory system: Secondary | ICD-10-CM | POA: Diagnosis not present

## 2021-02-14 DIAGNOSIS — Z823 Family history of stroke: Secondary | ICD-10-CM

## 2021-02-14 DIAGNOSIS — I2510 Atherosclerotic heart disease of native coronary artery without angina pectoris: Secondary | ICD-10-CM | POA: Diagnosis present

## 2021-02-14 DIAGNOSIS — E039 Hypothyroidism, unspecified: Secondary | ICD-10-CM | POA: Diagnosis present

## 2021-02-14 DIAGNOSIS — R404 Transient alteration of awareness: Secondary | ICD-10-CM | POA: Diagnosis not present

## 2021-02-14 DIAGNOSIS — I959 Hypotension, unspecified: Secondary | ICD-10-CM | POA: Diagnosis not present

## 2021-02-14 DIAGNOSIS — E1122 Type 2 diabetes mellitus with diabetic chronic kidney disease: Secondary | ICD-10-CM | POA: Diagnosis present

## 2021-02-14 DIAGNOSIS — D539 Nutritional anemia, unspecified: Secondary | ICD-10-CM | POA: Diagnosis present

## 2021-02-14 DIAGNOSIS — E785 Hyperlipidemia, unspecified: Secondary | ICD-10-CM | POA: Diagnosis present

## 2021-02-14 DIAGNOSIS — K922 Gastrointestinal hemorrhage, unspecified: Secondary | ICD-10-CM | POA: Diagnosis present

## 2021-02-14 DIAGNOSIS — I480 Paroxysmal atrial fibrillation: Secondary | ICD-10-CM | POA: Diagnosis present

## 2021-02-14 DIAGNOSIS — Z7902 Long term (current) use of antithrombotics/antiplatelets: Secondary | ICD-10-CM

## 2021-02-14 DIAGNOSIS — Z8673 Personal history of transient ischemic attack (TIA), and cerebral infarction without residual deficits: Secondary | ICD-10-CM

## 2021-02-14 DIAGNOSIS — K921 Melena: Secondary | ICD-10-CM | POA: Diagnosis not present

## 2021-02-14 DIAGNOSIS — N183 Chronic kidney disease, stage 3 unspecified: Secondary | ICD-10-CM | POA: Diagnosis present

## 2021-02-14 DIAGNOSIS — E1151 Type 2 diabetes mellitus with diabetic peripheral angiopathy without gangrene: Secondary | ICD-10-CM | POA: Diagnosis present

## 2021-02-14 DIAGNOSIS — Z931 Gastrostomy status: Secondary | ICD-10-CM

## 2021-02-14 DIAGNOSIS — J9611 Chronic respiratory failure with hypoxia: Secondary | ICD-10-CM | POA: Diagnosis present

## 2021-02-14 DIAGNOSIS — R609 Edema, unspecified: Secondary | ICD-10-CM | POA: Diagnosis not present

## 2021-02-14 DIAGNOSIS — J449 Chronic obstructive pulmonary disease, unspecified: Secondary | ICD-10-CM | POA: Diagnosis present

## 2021-02-14 DIAGNOSIS — M7989 Other specified soft tissue disorders: Secondary | ICD-10-CM | POA: Diagnosis not present

## 2021-02-14 DIAGNOSIS — Z86718 Personal history of other venous thrombosis and embolism: Secondary | ICD-10-CM

## 2021-02-14 DIAGNOSIS — N1832 Chronic kidney disease, stage 3b: Secondary | ICD-10-CM | POA: Diagnosis present

## 2021-02-14 DIAGNOSIS — Z89431 Acquired absence of right foot: Secondary | ICD-10-CM

## 2021-02-14 DIAGNOSIS — M79651 Pain in right thigh: Secondary | ICD-10-CM | POA: Diagnosis not present

## 2021-02-14 DIAGNOSIS — Z79899 Other long term (current) drug therapy: Secondary | ICD-10-CM

## 2021-02-14 DIAGNOSIS — N1831 Chronic kidney disease, stage 3a: Secondary | ICD-10-CM | POA: Diagnosis not present

## 2021-02-14 DIAGNOSIS — Z79890 Hormone replacement therapy (postmenopausal): Secondary | ICD-10-CM

## 2021-02-14 DIAGNOSIS — Z8616 Personal history of COVID-19: Secondary | ICD-10-CM

## 2021-02-14 DIAGNOSIS — Z9981 Dependence on supplemental oxygen: Secondary | ICD-10-CM

## 2021-02-14 DIAGNOSIS — S0990XA Unspecified injury of head, initial encounter: Secondary | ICD-10-CM | POA: Diagnosis not present

## 2021-02-14 DIAGNOSIS — W19XXXA Unspecified fall, initial encounter: Secondary | ICD-10-CM | POA: Diagnosis not present

## 2021-02-14 DIAGNOSIS — E1169 Type 2 diabetes mellitus with other specified complication: Secondary | ICD-10-CM | POA: Diagnosis present

## 2021-02-14 DIAGNOSIS — Z89432 Acquired absence of left foot: Secondary | ICD-10-CM

## 2021-02-14 DIAGNOSIS — Z7982 Long term (current) use of aspirin: Secondary | ICD-10-CM | POA: Diagnosis not present

## 2021-02-14 DIAGNOSIS — Z833 Family history of diabetes mellitus: Secondary | ICD-10-CM

## 2021-02-14 LAB — COMPREHENSIVE METABOLIC PANEL
ALT: 17 U/L (ref 0–44)
AST: 22 U/L (ref 15–41)
Albumin: 3 g/dL — ABNORMAL LOW (ref 3.5–5.0)
Alkaline Phosphatase: 76 U/L (ref 38–126)
Anion gap: 6 (ref 5–15)
BUN: 91 mg/dL — ABNORMAL HIGH (ref 8–23)
CO2: 24 mmol/L (ref 22–32)
Calcium: 9.1 mg/dL (ref 8.9–10.3)
Chloride: 109 mmol/L (ref 98–111)
Creatinine, Ser: 1.8 mg/dL — ABNORMAL HIGH (ref 0.44–1.00)
GFR, Estimated: 26 mL/min — ABNORMAL LOW (ref >60)
Glucose, Bld: 137 mg/dL — ABNORMAL HIGH (ref 70–99)
Potassium: 5 mmol/L (ref 3.5–5.1)
Sodium: 139 mmol/L (ref 135–145)
Total Bilirubin: 0.3 mg/dL (ref 0.3–1.2)
Total Protein: 7 g/dL (ref 6.5–8.1)

## 2021-02-14 LAB — RESP PANEL BY RT-PCR (FLU A&B, COVID) ARPGX2
Influenza A by PCR: NEGATIVE
Influenza B by PCR: NEGATIVE
SARS Coronavirus 2 by RT PCR: NEGATIVE

## 2021-02-14 LAB — CBC
HCT: 17.2 % — ABNORMAL LOW (ref 36.0–46.0)
Hemoglobin: 5.3 g/dL — CL (ref 12.0–15.0)
MCH: 32.5 pg (ref 26.0–34.0)
MCHC: 30.8 g/dL (ref 30.0–36.0)
MCV: 105.5 fL — ABNORMAL HIGH (ref 80.0–100.0)
Platelets: 300 10*3/uL (ref 150–400)
RBC: 1.63 MIL/uL — ABNORMAL LOW (ref 3.87–5.11)
RDW: 16.5 % — ABNORMAL HIGH (ref 11.5–15.5)
WBC: 9.2 10*3/uL (ref 4.0–10.5)
nRBC: 0.4 % — ABNORMAL HIGH (ref 0.0–0.2)

## 2021-02-14 LAB — URINALYSIS, ROUTINE W REFLEX MICROSCOPIC
Bilirubin Urine: NEGATIVE
Glucose, UA: NEGATIVE mg/dL
Hgb urine dipstick: NEGATIVE
Ketones, ur: NEGATIVE mg/dL
Leukocytes,Ua: NEGATIVE
Nitrite: NEGATIVE
Protein, ur: NEGATIVE mg/dL
Specific Gravity, Urine: 1.013 (ref 1.005–1.030)
pH: 7 (ref 5.0–8.0)

## 2021-02-14 LAB — PROTIME-INR
INR: 1.2 (ref 0.8–1.2)
Prothrombin Time: 14.7 seconds (ref 11.4–15.2)

## 2021-02-14 LAB — PREPARE RBC (CROSSMATCH)

## 2021-02-14 LAB — CBG MONITORING, ED: Glucose-Capillary: 130 mg/dL — ABNORMAL HIGH (ref 70–99)

## 2021-02-14 LAB — POC OCCULT BLOOD, ED: Fecal Occult Bld: POSITIVE — AB

## 2021-02-14 MED ORDER — ACETAMINOPHEN 325 MG PO TABS: 650.0000 mg | ORAL_TABLET | Freq: Four times a day (QID) | ORAL | Status: DC | PRN

## 2021-02-14 MED ORDER — SODIUM CHLORIDE 0.9 % IV SOLN
10.0000 mL/h | Freq: Once | INTRAVENOUS | Status: AC
  Administered 2021-02-14: 10 mL/h via INTRAVENOUS

## 2021-02-14 MED ORDER — ONDANSETRON HCL 4 MG PO TABS: 4.0000 mg | ORAL_TABLET | Freq: Four times a day (QID) | ORAL | Status: DC | PRN

## 2021-02-14 MED ORDER — ACETAMINOPHEN 325 MG PO TABS
650.0000 mg | ORAL_TABLET | Freq: Once | ORAL | Status: AC
  Administered 2021-02-14: 650 mg via ORAL
  Filled 2021-02-14: qty 2

## 2021-02-14 MED ORDER — ACETAMINOPHEN 650 MG RE SUPP: 650.0000 mg | Freq: Four times a day (QID) | RECTAL | Status: DC | PRN

## 2021-02-14 MED ORDER — PANTOPRAZOLE SODIUM 40 MG IV SOLR: 40.0000 mg | Freq: Two times a day (BID) | INTRAVENOUS | Status: DC

## 2021-02-14 MED ORDER — PANTOPRAZOLE 80MG IVPB - SIMPLE MED
80.0000 mg | Freq: Once | INTRAVENOUS | Status: AC
  Administered 2021-02-14: 80 mg via INTRAVENOUS
  Filled 2021-02-14: qty 80

## 2021-02-14 MED ORDER — ONDANSETRON HCL 4 MG/2ML IJ SOLN: 4.0000 mg | Freq: Four times a day (QID) | INTRAMUSCULAR | Status: DC | PRN

## 2021-02-14 MED ORDER — PANTOPRAZOLE INFUSION (NEW) - SIMPLE MED
8.0000 mg/h | INTRAVENOUS | Status: DC
  Administered 2021-02-14 – 2021-02-15 (×2): 8 mg/h via INTRAVENOUS
  Filled 2021-02-14 (×2): qty 80

## 2021-02-14 NOTE — ED Triage Notes (Signed)
Pt here from home with c/o gen weakness times 1 week and lethargic today , pt is awake and will answer questions on arrival has a feeding tube just finished amoxicillin recently for foot infection

## 2021-02-14 NOTE — Consult Note (Signed)
[facility] Nurse Consult Note: Reason for Consult:Chronic, nonhealing areas on bilateral feet. Patient is closely followed by podiatric medicine in the community and was seen by Dr. Marilynn Rail in his office on 11/2. See note from that encounter in the EHR. Conservative measures are the POC both requested by patient and endorsed by Provider. Additionally, patient is supported by [facility]. Wound type: chronic, full thickness, infectious Pressure Injury POA: NA Measurement: To be obtained by Bedside RN Wound bed:  RLE: pale pink, nongranulating, moderate light yellow exudate LLE: callous with small wound beneath, no drainage Drainage (amount, consistency, odor) As noted above Periwound: dry, scaling Dressing procedure/placement/frequency:  Recommend consultation with Dr. Amalia Hailey for definitive POC, however the past note indicates use of a silver hydrofiber m(Aquacel Ag+ Advantage), which we have in house.  I will place those orders for nursing and request daily dressing changes.  Patient is to keep her next appointment with Dr. Ellard Artis as directed.  [facility] nursing team will not follow, but will remain available to this patient, the nursing and medical teams.  Please re-consult if needed. Thanks, Maudie Flakes, MSN, RN, [place], Arther Abbott  Pager# [phone]

## 2021-02-14 NOTE — Progress Notes (Signed)
Lower extremity venous has been completed.   Preliminary results in CV Proc.   Shannon [name]  02/14/2021 3:42 PM

## 2021-02-14 NOTE — ED Provider Notes (Signed)
Emergency Medicine Provider Triage Evaluation Note  Shannon [name] , a 87 y.o. female  was evaluated in triage.  Pt complains of generalized weakness x 1 week. Today is complaining of right leg pain and difficulty moving. Of note patient has feeding tube, and recently finished antibiotic course for foot infection. She states she had a fall earlier this morning and hit hear head, is on blood thinners.   Review of Systems  Positive: Weakness, lethargy, right leg pain and swelling Negative: Chest pain, shortness of breath  Physical Exam  BP (!) 144/48   Pulse 76   Temp 98.3 F (36.8 C)   Resp 18   SpO2 100%  Gen:   Awake, no distress   Resp:  Normal effort  MSK:   Moves extremities without difficulty  Other:  Right foot and leg swelling, nonpitting, no overlying skin changes  Medical Decision Making  Medically screening exam initiated at 2:50 PM.  Appropriate orders placed.  LATRISH MOGEL was informed that the remainder of the evaluation will be completed by another provider, this initial triage assessment does not replace that evaluation, and the importance of remaining in the ED until their evaluation is complete.     Estill Cotta 02/14/21 1452    Lucrezia Starch, MD 02/15/21 2256

## 2021-02-14 NOTE — ED Provider Notes (Signed)
[facility] EMERGENCY DEPARTMENT Provider Note   CSN: [id number] Arrival date & time: 02/14/21  1406     History No chief complaint on file.   Shannon [name] is a 87 y.o. female.  Patient presents with worsening general weakness over the past week which has led to unwitnessed fall and right side pain.  Patient also hit her head and is on Plavix and aspirin.  Patient has multiple medical problems and has received blood transfusions in the past year.  Patient has atrial flutter history.  Patient was on Coumadin however taken off due to bleeding in the past.  Patient is kidney disease and diastolic heart failure history.  Patient has a history of DVT.  Patient has history of COPD and stomach ulcers as well as high blood pressure.  Of note patient recently finished antibiotics for right foot infection.  She had surgery for bone infection.  Patient has had general weakness and general lethargy worsening this week.  No gross bleeding or black color stools per family through who she lives with.  No fevers, shortness of breath, chest pain or cough.      Past Medical History:  Diagnosis Date   Acute respiratory failure (HCC)    Anemia    previous blood transfusions   Arthritis    "all over"   Asthma    Atrial flutter (HCC)    Bradycardia    requiring previous d/c of BB and reduction of amiodarone   CAD (coronary artery disease)    nonobstructive per notes   Chronic diastolic CHF (congestive heart failure) (HCC)    CKD (chronic kidney disease), stage III (HCC)    Complication of blood transfusion    "got the wrong blood type at [facility] in ~ 2015; no adverse reaction that we are aware of"/daughter, [name] (01/27/2016)   COPD (chronic obstructive pulmonary disease) (HCC)    Depression    "light case"   DVT (deep venous thrombosis) (HCC) 01/2016   a. LLE DVT 01/2016 - switched from Eliquis to Coumadin.   Dyspnea    with some activity   Gastric stenosis    a. s/p  stomach tube   GERD (gastroesophageal reflux disease)    Headache    History of 2019 novel coronavirus disease (COVID-19)    History of blood transfusion    "several" (01/27/2016)   History of stomach ulcers    Hyperlipidemia    Hypertension    Hypothyroidism    On home oxygen therapy    "2L; 9PM til 9AM" (06/27/2017)   PAD (peripheral artery disease) (HCC)    a. prior gangrene, toe amputations, intervention.   PAF (paroxysmal atrial fibrillation) (HCC)    Paraesophageal hernia    Perforated gastric ulcer (HCC)    Pneumonia    "a few times" (06/27/2017)   Seasonal allergies    SIADH (syndrome of inappropriate ADH production) (HCC)    [name] 01/10/2015   Small bowel obstruction (HCC)    "I don't know how many" (01/11/2015)   Stroke (HCC)    several- no residual   Type II diabetes mellitus (HCC)    "related to prednisone use  for > 20 yr; once predinose stopped; no more DM RX" (01/27/2016)   UTI (urinary tract infection) 02/08/2016   Ventral hernia with bowel obstruction     Patient Active Problem List   Diagnosis Date Noted   Aortic atherosclerosis (HCC) 01/06/2021   Osteomyelitis of right foot (HCC) 12/21/2020   Medication  monitoring encounter 12/21/2020   Diabetic infection of right foot (HCC) 12/21/2020   Diabetic foot infection (HCC) 12/21/2020   Osteomyelitis of left foot (HCC) 12/21/2020   UGI bleed 10/11/2020   Depression, major, single episode, complete remission (HCC) 06/09/2020   History of transmetatarsal amputation of right foot (HCC) 11/14/2019   Chest pain 08/27/2019   Lab test positive for detection of COVID-19 virus 08/27/2019   AMS (altered mental status) 07/07/2019   Varicose veins of right lower extremity with ulcer other part of foot (HCC) 06/11/2019   Jejunostomy tube fell out 03/02/2019   S/P amputation of lesser toe, left (HCC) 02/13/2019   BRBPR (bright red blood per rectum) 01/10/2019   GI bleed 12/25/2018   Bradycardia 11/25/2018   Amputated  great toe of right foot (HCC) 05/06/2018   Peripheral vascular disease (HCC) 05/03/2018   Localized, primary osteoarthritis of shoulder region 01/21/2018   PAD (peripheral artery disease) (HCC) 06/27/2017   AKI (acute kidney injury) (HCC) 02/20/2017   Complication of feeding tube (HCC) 02/20/2017   PEG tube malfunction (HCC)    COPD with asthma (HCC) 10/03/2016   Chronic deep vein thrombosis (DVT) of tibial vein of left lower extremity (HCC) 10/03/2016   Gangrene of toe of left foot (HCC) 09/15/2016   Recurrent aspiration pneumonia (HCC)    Atherosclerosis of native arteries of the extremities with gangrene (HCC) 07/28/2016   Other specified hypothyroidism    CHF (NYHA class IV, ACC/AHA stage D) (HCC)    Shortness of breath    Pressure injury of skin 03/11/2016   PEG (percutaneous endoscopic gastrostomy) status (HCC) 03/10/2016   Encounter for therapeutic drug monitoring 02/16/2016   Warfarin anticoagulation    Chronic seasonal allergic rhinitis    Atrial fibrillation and flutter (HCC) 01/24/2016   Chronic diastolic CHF (congestive heart failure) (HCC) 01/24/2016   Bilateral leg edema 12/29/2015   Hypothyroidism due to medication    Weakness    Pyloric stenosis    Pressure ulcer 09/15/2015   PVD (peripheral vascular disease) (HCC)    Diabetic ulcer of toe of right foot associated with type 2 diabetes mellitus, with necrosis of muscle (HCC)    Diverticulitis of large intestine without perforation or abscess without bleeding    Colitis 08/26/2015   Esophageal candidiasis (HCC)    SOB (shortness of breath)    Acute renal failure superimposed on stage 4 chronic kidney disease (HCC)    Encounter for long-term (current) use of other high-risk medications    Dysphagia 07/05/2015   Asthma with allergic rhinitis 06/04/2015   Hypertensive heart disease 03/18/2015   Malnutrition of moderate degree 01/28/2015   Syncope 01/27/2015   Symptomatic bradycardia 01/21/2015   Hypotension due to  drugs 01/21/2015   Protein-calorie malnutrition, severe 01/12/2015   Hypertension associated with diabetes (HCC) 01/12/2015   Cerebrovascular disease 07/14/2014   Diabetic polyneuropathy associated with type 2 diabetes mellitus (HCC) 07/14/2014   Hyperlipidemia 07/14/2014   Gastric outlet obstruction 05/21/2014   Unstable gait 04/29/2014   Slurred speech    Gastroparesis    Constipation 02/03/2014   Carotid stenosis 02/03/2014   Demand ischemia (HCC) 01/21/2014   Gastric bezoar 01/20/2014   Anemia of chronic disease 01/18/2014   CKD (chronic kidney disease) stage 3, GFR 30-59 ml/min (HCC) 01/18/2014   Depression 09/29/2013   Essential hypertension, benign 09/22/2013   GERD (gastroesophageal reflux disease) 09/22/2013   Hyperlipidemia associated with type 2 diabetes mellitus (HCC) 09/22/2013   Type 2 diabetes mellitus with chronic kidney  disease (HCC) 09/22/2013   Persistent atrial fibrillation 09/22/2013   History of completed stroke 09/22/2013   Diabetic neuropathy (HCC) 09/22/2013   Hypothyroidism 09/22/2013   Chronic gastrojejunal ulcer with perforation (HCC) 09/07/2013   Accelerated hypertension 09/07/2013   Other specified abnormal immunological findings in serum 09/07/2013   Red blood cell antibody positive 09/07/2013    Past Surgical History:  Procedure Laterality Date   ABDOMINAL AORTOGRAM N/A 09/21/2016   Procedure: Abdominal Aortogram;  Surgeon: Maeola Harman, MD;  Location: [facility] INVASIVE CV LAB;  Service: Cardiovascular;  Laterality: N/A;   ABDOMINAL AORTOGRAM W/LOWER EXTREMITY N/A 10/20/2020   Procedure: ABDOMINAL AORTOGRAM W/LOWER EXTREMITY;  Surgeon: Leonie Douglas, MD;  Location: [facility] INVASIVE CV LAB;  Service: Cardiovascular;  Laterality: N/A;   AMPUTATION Left 09/25/2016   Procedure: AMPUTATION DIGIT- LEFT 2ND AND 3RD TOES;  Surgeon: Larina Earthly, MD;  Location: [facility] OR;  Service: Vascular;  Laterality: Left;   AMPUTATION Right 05/03/2018   Procedure:  AMPUTATION RIGHT GREAT TOE;  Surgeon: Larina Earthly, MD;  Location: [facility] OR;  Service: Vascular;  Laterality: Right;   CATARACT EXTRACTION W/ INTRAOCULAR LENS  IMPLANT, BILATERAL     CHOLECYSTECTOMY OPEN     COLECTOMY     ESOPHAGOGASTRODUODENOSCOPY N/A 01/19/2014   Procedure: ESOPHAGOGASTRODUODENOSCOPY (EGD);  Surgeon: Hilarie Fredrickson, MD;  Location: [facility] END[facility]COPY;  Service: Endoscopy;  Laterality: N/A;   ESOPHAGOGASTRODUODENOSCOPY N/A 01/20/2014   Procedure: ESOPHAGOGASTRODUODENOSCOPY (EGD);  Surgeon: Hilarie Fredrickson, MD;  Location: [facility]OSCOPY;  Service: Endoscopy;  Laterality: N/A;   ESOPHAGOGASTRODUODENOSCOPY N/A 03/19/2014   Procedure: ESOPHAGOGASTRODUODENOSCOPY (EGD);  Surgeon: Rachael Fee, MD;  Location: [facility];  Service: Endoscopy;  Laterality: N/A;   ESOPHAGOGASTRODUODENOSCOPY N/A 07/08/2015   Procedure: ESOPHAGOGASTRODUODENOSCOPY (EGD);  Surgeon: Sherrilyn Rist, MD;  Location: [facility] ENDOSCOPY;  Service: Endoscopy;  Laterality: N/A;   ESOPHAGOGASTRODUODENOSCOPY (EGD) WITH PROPOFOL N/A 09/15/2015   Procedure: ESOPHAGOGASTRODUODENOSCOPY (EGD) WITH PROPOFOL;  Surgeon: Ruffin Frederick, MD;  Location: [facility] ENDOSCOPY;  Service: Gastroenterology;  Laterality: N/A;   ESOPHAGOGASTRODUODENOSCOPY (EGD) WITH PROPOFOL N/A 10/12/2020   Procedure: ESOPHAGOGASTRODUODENOSCOPY (EGD) WITH PROPOFOL;  Surgeon: Iva Boop, MD;  Location: [facility] ENDOSCOPY;  Service: Endoscopy;  Laterality: N/A;   FOOT SURGERY     GASTROJEJUNOSTOMY     hx/notes 01/10/2015   GASTROJEJUNOSTOMY N/A 09/23/2015   Procedure: OPEN GASTROJEJUNOSTOMY TUBE PLACEMENT;  Surgeon: De Blanch Kinsinger, MD;  Location: [facility] ORS;  Service: General;  Laterality: N/A;   GLAUCOMA SURGERY Bilateral    HEMATOMA EVACUATION Right 10/20/2020   Procedure: EVACUATION HEMATOMA RIGHT GROIN;  Surgeon: Leonie Douglas, MD;  Location: [facility] OR;  Service: Vascular;  Laterality: Right;   IR CM INJ ANY COLONIC TUBE W/FLUORO  01/05/2017   IR CM INJ ANY  COLONIC TUBE W/FLUORO  06/07/2017   IR CM INJ ANY COLONIC TUBE W/FLUORO  11/05/2017   IR CM INJ ANY COLONIC TUBE W/FLUORO  09/18/2018   IR CM INJ ANY COLONIC TUBE W/FLUORO  03/06/2019   IR CM INJ ANY COLONIC TUBE W/FLUORO  03/11/2019   IR GASTR TUBE CONVERT GASTR-JEJ PER W/FL MOD SED  04/30/2020   IR GENERIC HISTORICAL  01/07/2016   IR GJ TUBE CHANGE 01/07/2016 Malachy Moan, MD [facility]   IR GENERIC HISTORICAL  01/27/2016   IR MECH REMOV OBSTRUC MAT ANY COLON TUBE W/FLUORO 01/27/2016 Richarda Overlie, MD [facility]   IR GENERIC HISTORICAL  02/07/2016   IR PATIENT EVAL TECH 0-60 MINS Darrell K Allred, PA-C  [facility]   IR GENERIC HISTORICAL  02/08/2016   IR GJ TUBE CHANGE 02/08/2016 Berdine Dance, MD [facility]   IR GENERIC HISTORICAL  01/06/2016   IR GJ TUBE CHANGE 01/06/2016 [facility] OUT REF   IR GENERIC HISTORICAL  05/02/2016   IR CM INJ ANY COLONIC TUBE W/FLUORO 05/02/2016 Oley Balm, MD [facility]   IR GENERIC HISTORICAL  05/15/2016   IR GJ TUBE CHANGE 05/15/2016 Simonne Come, MD [facility]   IR GENERIC HISTORICAL  06/28/2016   IR GJ TUBE CHANGE 06/28/2016 [facility]   IR GJ TUBE CHANGE  02/20/2017   IR GJ TUBE CHANGE  05/10/2017   IR GJ TUBE CHANGE  07/06/2017   IR GJ TUBE CHANGE  08/02/2017   IR GJ TUBE CHANGE  10/15/2017   IR GJ TUBE CHANGE  12/26/2017   IR GJ TUBE CHANGE  04/08/2018   IR GJ TUBE CHANGE  06/19/2018   IR GJ TUBE CHANGE  03/03/2019   IR GJ TUBE CHANGE  06/06/2019   IR GJ TUBE CHANGE  09/26/2019   IR GJ TUBE CHANGE  01/09/2020   IR GJ TUBE CHANGE  05/12/2020   IR GJ TUBE CHANGE  10/05/2020   IR GJ TUBE CHANGE  10/14/2020   IR GJ TUBE CHANGE  10/19/2020   IR PATIENT EVAL TECH 0-60 MINS  10/19/2016   IR PATIENT EVAL TECH 0-60 MINS  12/25/2016   IR PATIENT EVAL TECH 0-60 MINS  01/29/2017   IR PATIENT EVAL TECH 0-60 MINS  04/04/2017   IR PATIENT EVAL TECH 0-60 MINS  04/30/2017   IR PATIENT EVAL TECH 0-60 MINS  07/31/2017   IR REPLC  DUODEN/JEJUNO TUBE PERCUT W/FLUORO  11/14/2016   IRRIGATION AND DEBRIDEMENT FOOT Bilateral 12/26/2020   Procedure: IRRIGATION AND DEBRIDEMENT FOOT AND BONE BIOPSY;  Surgeon: Vivi Barrack, DPM;  Location: [facility] OR;  Service: Podiatry;  Laterality: Bilateral;   LAPAROTOMY N/A 01/20/2015   Procedure: EXPLORATORY LAPAROTOMY;  Surgeon: Abigail Miyamoto, MD;  Location: [facility] OR;  Service: General;  Laterality: N/A;   LOWER EXTREMITY ANGIOGRAPHY Left 09/21/2016   Procedure: Lower Extremity Angiography;  Surgeon: Maeola Harman, MD;  Location: [facility] INVASIVE CV LAB;  Service: Cardiovascular;  Laterality: Left;   LOWER EXTREMITY ANGIOGRAPHY Right 06/27/2017   Procedure: Lower Extremity Angiography;  Surgeon: Maeola Harman, MD;  Location: [facility] INVASIVE CV LAB;  Service: Cardiovascular;  Laterality: Right;   LOWER EXTREMITY ANGIOGRAPHY Right 12/24/2020   Procedure: LOWER EXTREMITY ANGIOGRAPHY;  Surgeon: Leonie Douglas, MD;  Location: [facility] INVASIVE CV LAB;  Service: Cardiovascular;  Laterality: Right;   LYSIS OF ADHESION N/A 01/20/2015   Procedure: LYSIS OF ADHESIONS < 1 HOUR;  Surgeon: Abigail Miyamoto, MD;  Location: [facility] OR;  Service: General;  Laterality: N/A;   PERIPHERAL VASCULAR BALLOON ANGIOPLASTY Left 09/21/2016   Procedure: Peripheral Vascular Balloon Angioplasty;  Surgeon: Maeola Harman, MD;  Location: [facility] INVASIVE CV LAB;  Service: Cardiovascular;  Laterality: Left;  drug coated balloon   PERIPHERAL VASCULAR BALLOON ANGIOPLASTY Right 06/27/2017   Procedure: PERIPHERAL VASCULAR BALLOON ANGIOPLASTY;  Surgeon: Maeola Harman, MD;  Location: [facility] INVASIVE CV LAB;  Service: Cardiovascular;  Laterality: Right;  [id number]   PERIPHERAL VASCULAR INTERVENTION Left 10/20/2020   Procedure: PERIPHERAL VASCULAR INTERVENTION;  Surgeon: Leonie Douglas, MD;  Location: [facility] INVASIVE CV LAB;  Service: Cardiovascular;  Laterality: Left;   TONSILLECTOMY     TRANSMETATARSAL  AMPUTATION Right 11/14/2019   Procedure: TRANSMETATARSAL AMPUTATION RIGHT FOOT;  Surgeon: Felecia Shelling, DPM;  Location: [facility] OR;  Service: Podiatry;  Laterality: Right;   TUBAL LIGATION     VENTRAL HERNIA REPAIR  2015   incarcerated ventral hernia ([facility] 09/2013)/notes 01/10/2015   WOUND EXPLORATION Right 10/20/2020   Procedure: PRIMARY CLOSURE COMMON FEMORAL ARTERY;  Surgeon: Leonie Douglas, MD;  Location: [facility] OR;  Service: Vascular;  Laterality: Right;     OB History   No obstetric history on file.     Family History  Problem Relation Age of Onset   Stroke Mother    Hypertension Mother    Diabetes Brother    Glaucoma Son    Glaucoma Son    Heart attack Neg Hx    Colon cancer Neg Hx    Stomach cancer Neg Hx     Social History   Tobacco Use   Smoking status: Never   Smokeless tobacco: Former    Types: Snuff    Quit date: 04/03/1980   Tobacco comments:    "used snuff in my younger days"  Vaping Use   Vaping Use: Never used  Substance Use Topics   Alcohol use: Never    Alcohol/week: 0.0 standard drinks   Drug use: Never    Home Medications Prior to Admission medications   Medication Sig Start Date End Date Taking? Authorizing Provider  acetaminophen (TYLENOL) 500 MG tablet Take 500 mg by mouth every 6 (six) hours as needed for moderate pain.    [provider]  amLODipine (NORVASC) 5 MG tablet TAKE 1 TABLET BY MOUTH EVERY DAY 10/25/20   Sharon Seller, NP  aspirin 81 MG EC tablet Take 1 tablet (81 mg total) by mouth daily. Swallow whole. 10/20/20 10/20/21  Leonie Douglas, MD  atorvastatin (LIPITOR) 10 MG tablet TAKE 1 TABLET BY MOUTH EVERY DAY 11/05/20   Sharon Seller, NP  b complex vitamins tablet Take 1 tablet by mouth daily.     [provider]  cholecalciferol (VITAMIN D3) 25 MCG (1000 UNIT) tablet Take 1,000 Units by mouth daily.    [provider]  clopidogrel (PLAVIX) 75 MG tablet Take 1 tablet (75 mg total) by mouth daily. 01/19/21  01/19/22  Sharon Seller, NP  cycloSPORINE (RESTASIS) 0.05 % ophthalmic emulsion USE 1 DROP INTO BOTH EYES TWICE DAILY 01/29/17   Kirt Boys, DO  DULoxetine (CYMBALTA) 30 MG capsule TAKE 1 CAPSULE BY MOUTH EVERY DAY 01/31/21   Sharon Seller, NP  ferrous sulfate 220 (44 Fe) MG/5ML solution TAKE 7.4 MLS (325 MG TOTAL) BY MOUTH DAILY. 12/16/20   Sharon Seller, NP  fluticasone (FLONASE) 50 MCG/ACT nasal spray 2 sprays into each nostril daily. 12/09/20   Sharon Seller, NP  furosemide (LASIX) 40 MG tablet TAKE 0.5-1 TABLETS (20-40 MG TOTAL) BY MOUTH SEE ADMIN INSTRUCTIONS. TAKE 20 MG DAILY, MAY INSTEAD TAKE 40 MG DAILY AS NEEDED FOR EDEMA 08/04/20   Meriam Sprague, MD  gabapentin (NEURONTIN) 300 MG capsule TAKE 1 CAPSULE BY MOUTH TWICE A DAY 11/26/20   Sharon Seller, NP  gentamicin cream (GARAMYCIN) 0.1 % Apply 1 application topically daily as needed (dry skin). 10/01/19   Felecia Shelling, DPM  hydrALAZINE (APRESOLINE) 50 MG tablet Take 1 tablet (50 mg total) by mouth 3 (three) times daily. 01/24/21   Meriam Sprague, MD  Insulin Pen Needle (B-D UF III MINI PEN NEEDLES) 31G X 5 MM MISC Use twice daily with giving insulin injections. Dx E11.9 07/17/18  Sharon Seller, NP  ipratropium-albuterol (DUONEB) 0.5-2.5 (3) MG/3ML SOLN Take 3 mLs by nebulization every 6 (six) hours as needed (shortness of breath).    [provider]  latanoprost (XALATAN) 0.005 % ophthalmic solution Place 1 drop into both eyes at bedtime.    [provider]  levalbuterol Pauline Aus HFA) 45 MCG/ACT inhaler Inhale 2 puffs into the lungs every 6 (six) hours as needed for wheezing.    [provider]  levothyroxine (SYNTHROID) 125 MCG tablet TAKE 1 TABLET BY MOUTH EVERY DAY 01/31/21   Sharon Seller, NP  loratadine (CLARITIN) 10 MG tablet Take 10 mg by mouth daily.    [provider]  mometasone-formoterol (DULERA) 200-5 MCG/ACT AERO INHALE TWO PUFFS BY MOUTH TWICE  DAILY 10/05/20   Sharon Seller, NP  Multiple Vitamin (MULTIVITAMIN WITH MINERALS) TABS tablet Take 1 tablet by mouth daily.    [provider]  Nutritional Supplements (FEEDING SUPPLEMENT, OSMOLITE 1.5 CAL,) LIQD Place 711 mLs into feeding tube See admin instructions. Take 711 ml over a 12 hour period per day 9 am-9 pm at a rate of 65    [provider]  nystatin cream (MYCOSTATIN) APPLY TO AFFECTED AREA TWICE A DAY 05/10/20   Sharon Seller, NP  oxyCODONE (ROXICODONE) 5 MG immediate release tablet Take 0.5-1 tablets (2.5-5 mg total) by mouth every 8 (eight) hours as needed for severe pain. 10/05/20   Sharon Seller, NP  pantoprazole (PROTONIX) 40 MG tablet Take 1 tablet (40 mg total) by mouth 2 (two) times daily. 10/15/20   Sharon Seller, NP  Polyethyl Glycol-Propyl Glycol (SYSTANE) 0.4-0.3 % GEL ophthalmic gel Place 1 application into both eyes 2 (two) times daily as needed (moisture).    [provider]  Probiotic Product (EQL PROBIOTIC COLON SUPPORT PO) Take 1 capsule by mouth daily.    [provider]  senna-docusate (SENOKOT-S) 8.6-50 MG tablet Take 1 tablet by mouth at bedtime.    [provider]  shark liver oil-cocoa butter (PREPARATION H) 0.25-3-85.5 % suppository Place 1 suppository rectally as needed for hemorrhoids.    [provider]  traMADol (ULTRAM) 50 MG tablet Place 1 tablet (50 mg total) into feeding tube every 6 (six) hours as needed for severe pain. 10/22/20   Lars Mage, PA-C  Water For Irrigation, Sterile (FREE WATER) SOLN Place 180 mLs into feeding tube 3 (three) times daily.    [provider]  White Petrolatum-Mineral Oil (SYSTANE NIGHTTIME) OINT Place 1 application into both eyes at bedtime.     [provider]    Allergies    Patient has no active allergies.  Review of Systems   Review of Systems  Constitutional:  Negative for chills and fever.  HENT:  Negative for congestion.    Eyes:  Negative for visual disturbance.  Respiratory:  Negative for shortness of breath.   Cardiovascular:  Negative for chest pain.  Gastrointestinal:  Negative for abdominal pain and vomiting.  Genitourinary:  Negative for dysuria and flank pain.  Musculoskeletal:  Positive for gait problem. Negative for back pain, neck pain and neck stiffness.  Skin:  Negative for rash.  Neurological:  Positive for weakness and light-headedness. Negative for headaches.   Physical Exam Updated Vital Signs BP (!) 145/47 (BP Location: Right Arm)   Pulse 78   Temp 98.3 F (36.8 C)   Resp 18   Ht 5\' 6"  (1.676 m)   Wt 57.2 kg   SpO2 100%  BMI 20.34 kg/m   Physical Exam Vitals and nursing note reviewed.  Constitutional:      General: She is not in acute distress.    Appearance: She is well-developed.  HENT:     Head: Normocephalic and atraumatic.     Mouth/Throat:     Mouth: Mucous membranes are moist.  Eyes:     General:        Right eye: No discharge.        Left eye: No discharge.  Neck:     Trachea: No tracheal deviation.  Cardiovascular:     Rate and Rhythm: Normal rate and regular rhythm.  Pulmonary:     Effort: Pulmonary effort is normal.  Abdominal:     General: There is no distension.     Palpations: Abdomen is soft.     Tenderness: There is abdominal tenderness. There is no guarding.     Comments: Patient has superficial irritation and abrasion to G-tube entrance site, mild tenderness surrounding.  No spreading erythema.  Genitourinary:    Comments: Patient has no active bleeding, darker stool on Hemoccult, no external hemorrhoid, normal tone. Musculoskeletal:        General: Tenderness present. No swelling.     Cervical back: Normal range of motion and neck supple. No rigidity.  Skin:    General: Skin is warm.     Capillary Refill: Capillary refill takes 2 to 3 seconds.     Coloration: Skin is pale.     Findings: No rash.     Comments: Patient has postop healing right  distal foot without signs of external infection nontender no purulence no spreading erythema or warmth.  Superficial ulcer visualized.  Neurological:     General: No focal deficit present.     Mental Status: She is alert.     Cranial Nerves: No cranial nerve deficit.     Motor: Weakness present.     Comments: Patient has general weakness worse in the right leg.  Patient also has discomfort with flexion of the right hip.  Patient follows commands and quite verbal discussion with no obvious slurring.  No facial droop.  Horizontal eye movements intact pupils equal.    ED Results / Procedures / Treatments   Labs (all labs ordered are listed, but only abnormal results are displayed) Labs Reviewed  CBC - Abnormal; Notable for the following components:      Result Value   RBC 1.63 (*)    Hemoglobin 5.3 (*)    HCT 17.2 (*)    MCV 105.5 (*)    RDW 16.5 (*)    nRBC 0.4 (*)    All other components within normal limits  COMPREHENSIVE METABOLIC PANEL - Abnormal; Notable for the following components:   Glucose, Bld 137 (*)    BUN 91 (*)    Creatinine, Ser 1.80 (*)    Albumin 3.0 (*)    GFR, Estimated 26 (*)    All other components within normal limits  CBG MONITORING, ED - Abnormal; Notable for the following components:   Glucose-Capillary 130 (*)    All other components within normal limits  URINALYSIS, ROUTINE W REFLEX MICROSCOPIC  PROTIME-INR  POC OCCULT BLOOD, ED  TYPE AND SCREEN  PREPARE RBC (CROSSMATCH)    EKG EKG Interpretation  Date/Time:  Monday February 14 2021 15:04:38 EST Ventricular Rate:  81 PR Interval:  152 QRS Duration: 68 QT Interval:  374 QTC Calculation: 434 R Axis:   61 Text Interpretation: Normal sinus  rhythm Normal ECG Confirmed by Blane Ohara [phone]) on 02/14/2021 4:10:23 PM  Radiology CT Head Wo Contrast  Result Date: 02/14/2021 CLINICAL DATA:  Head trauma, minor (Age >= 65y) EXAM: CT HEAD WITHOUT CONTRAST TECHNIQUE: Contiguous axial images were  obtained from the base of the skull through the vertex without intravenous contrast. COMPARISON:  12/28/2020 FINDINGS: Brain: There is no acute intracranial hemorrhage, mass effect, or edema. No new loss of gray-white differentiation. There is no extra-axial fluid collection. Prominence of the ventricles and sulci reflects stable parenchymal volume loss. Patchy hypoattenuation in the supratentorial white matter likely reflects stable chronic microvascular ischemic changes. 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 Vascular: There is atherosclerotic calcification at the skull base. Skull: Calvarium is unremarkable. Sinuses/Orbits: No acute finding. Other: None. IMPRESSION: No evidence of acute intracranial injury. Electronically Signed   By: Guadlupe Spanish M.D.   On: 02/14/2021 16:56   VAS [place] LOWER EXTREMITY VENOUS (DVT) (7a-7p)  Result Date: 02/14/2021  Lower Venous DVT Study Patient Name:  CALEENA RUSHLOW  Date of Exam:   02/14/2021 Medical Rec #: [id number]      Accession #:    [id number] Date of Birth: 02-Aug-1923      Patient Gender: F Patient Age:   64 years Exam Location:  [facility] Procedure:      VAS [place] LOWER EXTREMITY VENOUS (DVT) Referring Phys: Griffin Basil ROEMHILDT --------------------------------------------------------------------------------  Indications: Edema, and Swelling.  Comparison Study: no prior Performing Technologist: [place] Ponder RVS  Examination Guidelines: A complete evaluation includes B-mode imaging, spectral Doppler, color Doppler, and power Doppler as needed of all accessible portions of each vessel. Bilateral testing is considered an integral part of a complete examination. Limited examinations for reoccurring indications may be performed as noted. The reflux portion of the exam is performed with the patient in reverse Trendelenburg.  +---------+---------------+---------+-----------+----------+---------------+ RIGHT     CompressibilityPhasicitySpontaneityPropertiesThrombus Aging  +---------+---------------+---------+-----------+----------+---------------+ CFV      Full           Yes      Yes                                  +---------+---------------+---------+-----------+----------+---------------+ SFJ      Full                                                         +---------+---------------+---------+-----------+----------+---------------+ FV Prox  Full                                                         +---------+---------------+---------+-----------+----------+---------------+ FV Mid   Full                                                         +---------+---------------+---------+-----------+----------+---------------+ FV DistalFull                                                         +---------+---------------+---------+-----------+----------+---------------+  PFV      Full                                                         +---------+---------------+---------+-----------+----------+---------------+ POP      Full           Yes      Yes                                  +---------+---------------+---------+-----------+----------+---------------+ PTV      Full                                                         +---------+---------------+---------+-----------+----------+---------------+ PERO     Full                                         patent by color +---------+---------------+---------+-----------+----------+---------------+   +----+---------------+---------+-----------+----------+--------------+ LEFTCompressibilityPhasicitySpontaneityPropertiesThrombus Aging +----+---------------+---------+-----------+----------+--------------+ CFV Full           Yes      Yes                                 +----+---------------+---------+-----------+----------+--------------+     Summary: RIGHT: - There is no evidence of deep vein  thrombosis in the lower extremity.  - No cystic structure found in the popliteal fossa.  LEFT: - No evidence of common femoral vein obstruction.  *See table(s) above for measurements and observations. Electronically signed by Sherald Hess MD on 02/14/2021 at 4:45:32 PM.    Final     Procedures .Critical Care Performed by: Blane Ohara, MD Authorized by: Blane Ohara, MD   Critical care provider statement:    Critical care time (minutes):  40   Critical care start time:  02/14/2021 4:50 PM   Critical care end time:  02/14/2021 5:30 PM   Critical care time was exclusive of:  Separately billable procedures and treating other patients and teaching time   Critical care was time spent personally by me on the following activities:  Examination of patient, pulse oximetry, re-evaluation of patient's condition, ordering and review of radiographic studies, ordering and review of laboratory studies and review of old charts Comments:     Symptomatic anemia   Medications Ordered in ED Medications  0.9 %  sodium chloride infusion (has no administration in time range)    ED Course  I have reviewed the triage vital signs and the nursing notes.  Pertinent labs & imaging results that were available during my care of the patient were reviewed by me and considered in my medical decision making (see chart for details).    MDM Rules/Calculators/A&P                           Patient presents with general weakness, recent falls from home living with family.  Broad differential including metabolic, anemia, stroke, intra-abdominal related, urine infection, other.  Initially  blood work was ordered and results reviewed showing hemoglobin 5.3, dark stools Hemoccult sent.  Patient has acute on chronic renal failure with BUN 91 and creatinine 1.8.  Patient having no respiratory difficulty in the room with normal oxygenation.  Patient has chronic vascular disease and a flutter history not on Coumadin due to  bleeding history.  INR added for completeness.  Patient has CT scan of the head ordered and pending, CT scan of the abdomen without contrast due to renal failure ordered for further delineation of G-tube site and mild tenderness.  Discussed risk and benefits and goals of care, patient does make own decisions and family supporting and she agrees to risks and benefits of blood transfusion, 2 units ordered.  Patient eventually will be admitted after CT scan of the abdomen.  CT scan results reviewed with radiology mostly chronic findings.  Updated patient, family not in the room for update.  Discussed with hospitalist for admission. Discussed with nursing for blood transfusion. Final Clinical Impression(s) / ED Diagnoses Final diagnoses:  Symptomatic anemia  Abdominal pain, acute, left upper quadrant  Fall, initial encounter  General weakness    Rx / DC Orders ED Discharge Orders     None        Blane Ohara, MD 02/15/21 0006

## 2021-02-14 NOTE — H&P (Signed)
History and Physical    Shannon [name] FIE:[id number] DOB: Aug 11, 1923 DOA: 02/14/2021  PCP: Sharon Seller, NP  Patient coming from: Home  I have personally briefly reviewed patient's old medical records in [facility]  Chief Complaint: Generalized weakness  HPI: Shannon [name] is a 87 y.o. female with medical history significant of A.Flutter, HTN, CKD 3, gastric stenosis s/p PEG tube.  Pt with h/o DVT, used to be on eliquis, then coumadin, but this was stopped due to bleeding in past.  Pt with UGIB due to GERD esophagitis in July 2022.  Endoscopy done by Dr. Leone Payor at that time.  Pt with Sept 2022 admission for PAD, required partial amputation of foot.  Pt with L popliteal angioplasty 10/20/20.  Started on plavix after that admission.  Had RLE angiogram 12/24/20 without vascular intervention.  Had partial amputation of foot.  Pt continued on ASA+Plavix.  Now pt presents to ED with c/o generalized weakness onset 1 week ago, worsening, has caused a fall.  Nothing makes symptoms better or worse.  Has no gross blood in stools or gross melena per family.  No fevers, SOB, CP, cough.   ED Course: HGB 5.3 down from 9.0 in October.  BUN 91 and creat 1.8 up from her baseline of 40 and 1.2.  Objectively in ED, she IS found to have dark colored stools which are hemoccult positive.  3u crossmatch and 2u PRBC transfusion are ordered.   Review of Systems: As per HPI, otherwise all review of systems negative.  Past Medical History:  Diagnosis Date   Acute respiratory failure (HCC)    Anemia    previous blood transfusions   Arthritis    "all over"   Asthma    Atrial flutter (HCC)    Bradycardia    requiring previous d/c of BB and reduction of amiodarone   CAD (coronary artery disease)    nonobstructive per notes   Chronic diastolic CHF (congestive heart failure) (HCC)    CKD (chronic kidney disease), stage III (HCC)    Complication of blood transfusion    "got the wrong  blood type at [facility] in ~ 2015; no adverse reaction that we are aware of"/daughter, [name] (01/27/2016)   COPD (chronic obstructive pulmonary disease) (HCC)    Depression    "light case"   DVT (deep venous thrombosis) (HCC) 01/2016   a. LLE DVT 01/2016 - switched from Eliquis to Coumadin.   Dyspnea    with some activity   Gastric stenosis    a. s/p stomach tube   GERD (gastroesophageal reflux disease)    Headache    History of 2019 novel coronavirus disease (COVID-19)    History of blood transfusion    "several" (01/27/2016)   History of stomach ulcers    Hyperlipidemia    Hypertension    Hypothyroidism    On home oxygen therapy    "2L; 9PM til 9AM" (06/27/2017)   PAD (peripheral artery disease) (HCC)    a. prior gangrene, toe amputations, intervention.   PAF (paroxysmal atrial fibrillation) (HCC)    Paraesophageal hernia    Perforated gastric ulcer (HCC)    Pneumonia    "a few times" (06/27/2017)   Seasonal allergies    SIADH (syndrome of inappropriate ADH production) (HCC)    [name] 01/10/2015   Small bowel obstruction (HCC)    "I don't know how many" (01/11/2015)   Stroke (HCC)    several- no residual   Type II diabetes mellitus (  HCC)    "related to prednisone use  for > 20 yr; once predinose stopped; no more DM RX" (01/27/2016)   UTI (urinary tract infection) 02/08/2016   Ventral hernia with bowel obstruction     Past Surgical History:  Procedure Laterality Date   ABDOMINAL AORTOGRAM N/A 09/21/2016   Procedure: Abdominal Aortogram;  Surgeon: Maeola Harman, MD;  Location: [facility] INVASIVE CV LAB;  Service: Cardiovascular;  Laterality: N/A;   ABDOMINAL AORTOGRAM W/LOWER EXTREMITY N/A 10/20/2020   Procedure: ABDOMINAL AORTOGRAM W/LOWER EXTREMITY;  Surgeon: Leonie Douglas, MD;  Location: [facility] INVASIVE CV LAB;  Service: Cardiovascular;  Laterality: N/A;   AMPUTATION Left 09/25/2016   Procedure: AMPUTATION DIGIT- LEFT 2ND AND 3RD TOES;  Surgeon: Larina Earthly, MD;   Location: [facility] OR;  Service: Vascular;  Laterality: Left;   AMPUTATION Right 05/03/2018   Procedure: AMPUTATION RIGHT GREAT TOE;  Surgeon: Larina Earthly, MD;  Location: [facility] OR;  Service: Vascular;  Laterality: Right;   CATARACT EXTRACTION W/ INTRAOCULAR LENS  IMPLANT, BILATERAL     CHOLECYSTECTOMY OPEN     COLECTOMY     ESOPHAGOGASTRODUODENOSCOPY N/A 01/19/2014   Procedure: ESOPHAGOGASTRODUODENOSCOPY (EGD);  Surgeon: Hilarie Fredrickson, MD;  Location: [facility] ENDOSCOPY;  Service: Endoscopy;  Laterality: N/A;   ESOPHAGOGASTRODUODENOSCOPY N/A 01/20/2014   Procedure: ESOPHAGOGASTRODUODENOSCOPY (EGD);  Surgeon: Hilarie Fredrickson, MD;  Location: [facility] ENDOSCOPY;  Service: Endoscopy;  Laterality: N/A;   ESOPHAGOGASTRODUODENOSCOPY N/A 03/19/2014   Procedure: ESOPHAGOGASTRODUODENOSCOPY (EGD);  Surgeon: Rachael Fee, MD;  Location: [facility] ENDOSCOPY;  Service: Endoscopy;  Laterality: N/A;   ESOPHAGOGASTRODUODENOSCOPY N/A 07/08/2015   Procedure: ESOPHAGOGASTRODUODENOSCOPY (EGD);  Surgeon: Sherrilyn Rist, MD;  Location: [facility] ENDOSCOPY;  Service: Endoscopy;  Laterality: N/A;   ESOPHAGOGASTRODUODENOSCOPY (EGD) WITH PROPOFOL N/A 09/15/2015   Procedure: ESOPHAGOGASTRODUODENOSCOPY (EGD) WITH PROPOFOL;  Surgeon: Ruffin Frederick, MD;  Location: [facility] ENDOSCOPY;  Service: Gastroenterology;  Laterality: N/A;   ESOPHAGOGASTRODUODENOSCOPY (EGD) WITH PROPOFOL N/A 10/12/2020   Procedure: ESOPHAGOGASTRODUODENOSCOPY (EGD) WITH PROPOFOL;  Surgeon: Iva Boop, MD;  Location: [facility] ENDOSCOPY;  Service: Endoscopy;  Laterality: N/A;   FOOT SURGERY     GASTROJEJUNOSTOMY     hx/notes 01/10/2015   GASTROJEJUNOSTOMY N/A 09/23/2015   Procedure: OPEN GASTROJEJUNOSTOMY TUBE PLACEMENT;  Surgeon: De Blanch Kinsinger, MD;  Location: [facility] ORS;  Service: General;  Laterality: N/A;   GLAUCOMA SURGERY Bilateral    HEMATOMA EVACUATION Right 10/20/2020   Procedure: EVACUATION HEMATOMA RIGHT GROIN;  Surgeon: Leonie Douglas, MD;  Location: [facility] OR;   Service: Vascular;  Laterality: Right;   IR CM INJ ANY COLONIC TUBE W/FLUORO  01/05/2017   IR CM INJ ANY COLONIC TUBE W/FLUORO  06/07/2017   IR CM INJ ANY COLONIC TUBE W/FLUORO  11/05/2017   IR CM INJ ANY COLONIC TUBE W/FLUORO  09/18/2018   IR CM INJ ANY COLONIC TUBE W/FLUORO  03/06/2019   IR CM INJ ANY COLONIC TUBE W/FLUORO  03/11/2019   IR GASTR TUBE CONVERT GASTR-JEJ PER W/FL MOD SED  04/30/2020   IR GENERIC HISTORICAL  01/07/2016   IR GJ TUBE CHANGE 01/07/2016 Malachy Moan, MD [facility]   IR GENERIC HISTORICAL  01/27/2016   IR MECH REMOV OBSTRUC MAT ANY COLON TUBE W/FLUORO 01/27/2016 Richarda Overlie, MD [facility]   IR GENERIC HISTORICAL  02/07/2016   IR PATIENT EVAL TECH 0-60 MINS Darrell K Allred, PA-C [facility]   IR GENERIC HISTORICAL  02/08/2016   IR GJ TUBE CHANGE 02/08/2016 Berdine Dance, MD [facility]  IR GENERIC HISTORICAL  01/06/2016   IR GJ TUBE CHANGE 01/06/2016 [facility] OUT REF   IR GENERIC HISTORICAL  05/02/2016   IR CM INJ ANY COLONIC TUBE W/FLUORO 05/02/2016 Oley Balm, MD [facility]   IR GENERIC HISTORICAL  05/15/2016   IR GJ TUBE CHANGE 05/15/2016 Simonne Come, MD [facility]   IR GENERIC HISTORICAL  06/28/2016   IR GJ TUBE CHANGE 06/28/2016 [facility]   IR GJ TUBE CHANGE  02/20/2017   IR GJ TUBE CHANGE  05/10/2017   IR GJ TUBE CHANGE  07/06/2017   IR GJ TUBE CHANGE  08/02/2017   IR GJ TUBE CHANGE  10/15/2017   IR GJ TUBE CHANGE  12/26/2017   IR GJ TUBE CHANGE  04/08/2018   IR GJ TUBE CHANGE  06/19/2018   IR GJ TUBE CHANGE  03/03/2019   IR GJ TUBE CHANGE  06/06/2019   IR GJ TUBE CHANGE  09/26/2019   IR GJ TUBE CHANGE  01/09/2020   IR GJ TUBE CHANGE  05/12/2020   IR GJ TUBE CHANGE  10/05/2020   IR GJ TUBE CHANGE  10/14/2020   IR GJ TUBE CHANGE  10/19/2020   IR PATIENT EVAL TECH 0-60 MINS  10/19/2016   IR PATIENT EVAL TECH 0-60 MINS  12/25/2016   IR PATIENT EVAL TECH 0-60 MINS  01/29/2017   IR PATIENT EVAL TECH 0-60 MINS  04/04/2017   IR  PATIENT EVAL TECH 0-60 MINS  04/30/2017   IR PATIENT EVAL TECH 0-60 MINS  07/31/2017   IR REPLC DUODEN/JEJUNO TUBE PERCUT W/FLUORO  11/14/2016   IRRIGATION AND DEBRIDEMENT FOOT Bilateral 12/26/2020   Procedure: IRRIGATION AND DEBRIDEMENT FOOT AND BONE BIOPSY;  Surgeon: Vivi Barrack, DPM;  Location: [facility];  Service: Podiatry;  Laterality: Bilateral;   LAPAROTOMY N/A 01/20/2015   Procedure: EXPLORATORY LAPAROTOMY;  Surgeon: Abigail Miyamoto, MD;  Location: [facility] OR;  Service: General;  Laterality: N/A;   LOWER EXTREMITY ANGIOGRAPHY Left 09/21/2016   Procedure: Lower Extremity Angiography;  Surgeon: Maeola Harman, MD;  Location: [facility] INVASIVE CV LAB;  Service: Cardiovascular;  Laterality: Left;   LOWER EXTREMITY ANGIOGRAPHY Right 06/27/2017   Procedure: Lower Extremity Angiography;  Surgeon: Maeola Harman, MD;  Location: [facility] INVASIVE CV LAB;  Service: Cardiovascular;  Laterality: Right;   LOWER EXTREMITY ANGIOGRAPHY Right 12/24/2020   Procedure: LOWER EXTREMITY ANGIOGRAPHY;  Surgeon: Leonie Douglas, MD;  [facility]VAS[facility] CV LAB;  Service: Cardiovascular;  Laterality: Right;   LYSIS OF ADHESION N/A 01/20/2015   Procedure: LYSIS OF ADHESIONS < 1 HOUR;  Surgeon: Abigail Miyamoto, MD;  Location: [facility];  Service: General;  Laterality: N/A;   PERIPHERAL VASCULAR BALLOON ANGIOPLASTY Left 09/21/2016   Procedure: Peripheral Vascular Balloon Angioplasty;  Surgeon: Maeola Harman, MD;  Location: [facility] INVASIVE CV LAB;  Service: Cardiovascular;  Laterality: Left;  drug coated balloon   PERIPHERAL VASCULAR BALLOON ANGIOPLASTY Right 06/27/2017   Procedure: PERIPHERAL VASCULAR BALLOON ANGIOPLASTY;  Surgeon: Maeola Harman, MD;  Location: [facility] INVASIVE CV LAB;  Service: Cardiovascular;  Laterality: Right;  SFA/[id number]   PERIPHERAL VASCULAR INTERVENTION Left 10/20/2020   Procedure: PERIPHERAL VASCULAR INTERVENTION;  Surgeon: Leonie Douglas, MD;  [facility] INVASIVE  CV LAB;  Service: Cardiovascular;  Laterality: Left;   TONSILLECTOMY     TRANSMETATARSAL AMPUTATION Right 11/14/2019   Procedure: TRANSMETATARSAL AMPUTATION RIGHT FOOT;  Surgeon: Felecia Shelling, DPM;  Location: [facility] OR;  Service: Podiatry;  Laterality: Right;   TUBAL LIGATION  VENTRAL HERNIA REPAIR  2015   incarcerated ventral hernia ([facility] 09/2013)/notes 01/10/2015   WOUND EXPLORATION Right 10/20/2020   Procedure: PRIMARY CLOSURE COMMON FEMORAL ARTERY;  Surgeon: Leonie Douglas, MD;  Location: [facility] OR;  Service: Vascular;  Laterality: Right;     reports that she has never smoked. She quit smokeless tobacco use about 40 years ago.  Her smokeless tobacco use included snuff. She reports that she does not drink alcohol and does not use drugs.  No Active Allergies  Family History  Problem Relation Age of Onset   Stroke Mother    Hypertension Mother    Diabetes Brother    Glaucoma Son    Glaucoma Son    Heart attack Neg Hx    Colon cancer Neg Hx    Stomach cancer Neg Hx      Prior to Admission medications   Medication Sig Start Date End Date Taking? Authorizing Provider  acetaminophen (TYLENOL) 500 MG tablet Take 500 mg by mouth every 6 (six) hours as needed for moderate pain.    [provider]  amLODipine (NORVASC) 5 MG tablet TAKE 1 TABLET BY MOUTH EVERY DAY 10/25/20   Sharon Seller, NP  aspirin 81 MG EC tablet Take 1 tablet (81 mg total) by mouth daily. Swallow whole. 10/20/20 10/20/21  Leonie Douglas, MD  atorvastatin (LIPITOR) 10 MG tablet TAKE 1 TABLET BY MOUTH EVERY DAY 11/05/20   Sharon Seller, NP  b complex vitamins tablet Take 1 tablet by mouth daily.     [provider]  cholecalciferol (VITAMIN D3) 25 MCG (1000 UNIT) tablet Take 1,000 Units by mouth daily.    [provider]  clopidogrel (PLAVIX) 75 MG tablet Take 1 tablet (75 mg total) by mouth daily. 01/19/21 01/19/22  Sharon Seller, NP  cycloSPORINE (RESTASIS) 0.05 % ophthalmic  emulsion USE 1 DROP INTO BOTH EYES TWICE DAILY 01/29/17   Kirt Boys, DO  DULoxetine (CYMBALTA) 30 MG capsule TAKE 1 CAPSULE BY MOUTH EVERY DAY 01/31/21   Sharon Seller, NP  ferrous sulfate 220 (44 Fe) MG/5ML solution TAKE 7.4 MLS (325 MG TOTAL) BY MOUTH DAILY. 12/16/20   Sharon Seller, NP  fluticasone (FLONASE) 50 MCG/ACT nasal spray 2 sprays into each nostril daily. 12/09/20   Sharon Seller, NP  furosemide (LASIX) 40 MG tablet TAKE 0.5-1 TABLETS (20-40 MG TOTAL) BY MOUTH SEE ADMIN INSTRUCTIONS. TAKE 20 MG DAILY, MAY INSTEAD TAKE 40 MG DAILY AS NEEDED FOR EDEMA 08/04/20   Meriam Sprague, MD  gabapentin (NEURONTIN) 300 MG capsule TAKE 1 CAPSULE BY MOUTH TWICE A DAY 11/26/20   Sharon Seller, NP  gentamicin cream (GARAMYCIN) 0.1 % Apply 1 application topically daily as needed (dry skin). 10/01/19   Felecia Shelling, DPM  hydrALAZINE (APRESOLINE) 50 MG tablet Take 1 tablet (50 mg total) by mouth 3 (three) times daily. 01/24/21   Meriam Sprague, MD  Insulin Pen Needle (B-D UF III MINI PEN NEEDLES) 31G X 5 MM MISC Use twice daily with giving insulin injections. Dx E11.9 07/17/18   Sharon Seller, NP  ipratropium-albuterol (DUONEB) 0.5-2.5 (3) MG/3ML SOLN Take 3 mLs by nebulization every 6 (six) hours as needed (shortness of breath).    [provider]  latanoprost (XALATAN) 0.005 % ophthalmic solution Place 1 drop into both eyes at bedtime.    [provider]  levalbuterol Pauline Aus HFA) 45 MCG/ACT inhaler Inhale 2 puffs into the lungs every 6 (six) hours as  needed for wheezing.    [provider]  levothyroxine (SYNTHROID) 125 MCG tablet TAKE 1 TABLET BY MOUTH EVERY DAY 01/31/21   Sharon Seller, NP  loratadine (CLARITIN) 10 MG tablet Take 10 mg by mouth daily.    [provider]  mometasone-formoterol (DULERA) 200-5 MCG/ACT AERO INHALE TWO PUFFS BY MOUTH TWICE DAILY 10/05/20   Sharon Seller, NP  Multiple Vitamin (MULTIVITAMIN WITH  MINERALS) TABS tablet Take 1 tablet by mouth daily.    [provider]  Nutritional Supplements (FEEDING SUPPLEMENT, OSMOLITE 1.5 CAL,) LIQD Place 711 mLs into feeding tube See admin instructions. Take 711 ml over a 12 hour period per day 9 am-9 pm at a rate of 65    [provider]  nystatin cream (MYCOSTATIN) APPLY TO AFFECTED AREA TWICE A DAY 05/10/20   Sharon Seller, NP  oxyCODONE (ROXICODONE) 5 MG immediate release tablet Take 0.5-1 tablets (2.5-5 mg total) by mouth every 8 (eight) hours as needed for severe pain. 10/05/20   Sharon Seller, NP  pantoprazole (PROTONIX) 40 MG tablet Take 1 tablet (40 mg total) by mouth 2 (two) times daily. 10/15/20   Sharon Seller, NP  Polyethyl Glycol-Propyl Glycol (SYSTANE) 0.4-0.3 % GEL ophthalmic gel Place 1 application into both eyes 2 (two) times daily as needed (moisture).    [provider]  Probiotic Product (EQL PROBIOTIC COLON SUPPORT PO) Take 1 capsule by mouth daily.    [provider]  senna-docusate (SENOKOT-S) 8.6-50 MG tablet Take 1 tablet by mouth at bedtime.    [provider]  shark liver oil-cocoa butter (PREPARATION H) 0.25-3-85.5 % suppository Place 1 suppository rectally as needed for hemorrhoids.    [provider]  traMADol (ULTRAM) 50 MG tablet Place 1 tablet (50 mg total) into feeding tube every 6 (six) hours as needed for severe pain. 10/22/20   Lars Mage, PA-C  Water For Irrigation, Sterile (FREE WATER) SOLN Place 180 mLs into feeding tube 3 (three) times daily.    [provider]  White Petrolatum-Mineral Oil (SYSTANE NIGHTTIME) OINT Place 1 application into both eyes at bedtime.     [provider]    Physical Exam: Vitals:   02/14/21 1830 02/14/21 1854 02/14/21 1915 02/14/21 1925  BP: (!) 143/46 (!) 146/61 (!) 157/48   Pulse:  78 78   Resp: 17 14 19    Temp:      SpO2:  100% 100% 100%  Weight:      Height:        Constitutional: NAD,  calm, comfortable Eyes: PERRL, lids and conjunctivae normal ENMT: Mucous membranes are moist. Posterior pharynx clear of any exudate or lesions.Normal dentition.  Neck: normal, supple, no masses, no thyromegaly Respiratory: clear to auscultation bilaterally, no wheezing, no crackles. Normal respiratory effort. No accessory muscle use.  Cardiovascular: Regular rate and rhythm, no murmurs / rubs / gallops. No extremity edema. 2+ pedal pulses. No carotid bruits.  Abdomen: TTP, no guarding nor rebound, superficial irritation to G tube site, no spreading erythema. Musculoskeletal: no clubbing / cyanosis. No joint deformity upper and lower extremities. Good ROM, no contractures. Normal muscle tone.  Skin: Pale, has post-op R distal foot ulcer from partial amputation.  LLE has callus with small wound beneath. Neurologic: CN 2-12 grossly intact. Sensation intact, DTR normal. Strength 5/5 in all 4.  Psychiatric: Normal judgment and insight. Alert and oriented x 3. Normal mood.    Labs on Admission: I have personally reviewed  following labs and imaging studies  CBC: Recent Labs  Lab 02/14/21 1447  WBC 9.2  HGB 5.3*  HCT 17.2*  MCV 105.5*  PLT 300   Basic Metabolic Panel: Recent Labs  Lab 02/14/21 1447  NA 139  K 5.0  CL 109  CO2 24  GLUCOSE 137*  BUN 91*  CREATININE 1.80*  CALCIUM 9.1   GFR: Estimated Creatinine Clearance: 16.9 mL/min (A) (by C-G formula based on SCr of 1.8 mg/dL (H)). Liver Function Tests: Recent Labs  Lab 02/14/21 1447  AST 22  ALT 17  ALKPHOS 76  BILITOT 0.3  PROT 7.0  ALBUMIN 3.0*   No results for input(s): LIPASE, AMYLASE in the last 168 hours. No results for input(s): AMMONIA in the last 168 hours. Coagulation Profile: Recent Labs  Lab 02/14/21 1634  INR 1.2   Cardiac Enzymes: No results for input(s): CKTOTAL, CKMB, CKMBINDEX, TROPONINI in the last 168 hours. BNP (last 3 results) No results for input(s): PROBNP in the last 8760  hours. HbA1C: No results for input(s): HGBA1C in the last 72 hours. CBG: Recent Labs  Lab 02/14/21 1630  GLUCAP 130*   Lipid Profile: No results for input(s): CHOL, HDL, LDLCALC, TRIG, CHOLHDL, LDLDIRECT in the last 72 hours. Thyroid Function Tests: No results for input(s): TSH, T4TOTAL, FREET4, T3FREE, THYROIDAB in the last 72 hours. Anemia Panel: No results for input(s): VITAMINB12, FOLATE, FERRITIN, TIBC, IRON, RETICCTPCT in the last 72 hours. Urine analysis:    Component Value Date/Time   COLORURINE YELLOW 02/14/2021 1950   APPEARANCEUR CLEAR 02/14/2021 1950   LABSPEC 1.013 02/14/2021 1950   PHURINE 7.0 02/14/2021 1950   GLUCOSEU NEGATIVE 02/14/2021 1950   HGBUR NEGATIVE 02/14/2021 1950   BILIRUBINUR NEGATIVE 02/14/2021 1950   BILIRUBINUR Neg 11/01/2017 1228   KETONESUR NEGATIVE 02/14/2021 1950   PROTEINUR NEGATIVE 02/14/2021 1950   UROBILINOGEN 0.2 11/01/2017 1228   UROBILINOGEN 0.2 01/27/2015 2308   NITRITE NEGATIVE 02/14/2021 1950   LEUKOCYTESUR NEGATIVE 02/14/2021 1950    Radiological Exams on Admission: CT ABDOMEN PELVIS WO CONTRAST  Result Date: 02/14/2021 CLINICAL DATA:  87 year old with left lower quadrant abdominal pain. G-tube pain. Weakness and lethargy. EXAM: CT ABDOMEN AND PELVIS WITHOUT CONTRAST TECHNIQUE: Multidetector CT imaging of the abdomen and pelvis was performed following the standard protocol without IV contrast. COMPARISON:  CT 12/28/2020 FINDINGS: Lower chest: Cardiomegaly. No acute basilar airspace disease or pleural effusion. Hepatobiliary: Scattered small low-density liver lesions are similar to prior exam, though less well-defined currently in the absence of IV contrast. Subcapsular calcifications in the right lobe is unchanged. Cholecystectomy without biliary dilatation. Pancreas: Nonacute.  Atrophic.  No ductal dilatation. Spleen: Normal in size without focal abnormality. Adrenals/Urinary Tract: No adrenal nodule. Mild bilateral renal  parenchymal thinning. No hydronephrosis or urolithiasis. Tiny peripheral hyperdense lesion from the mid right kidney is too small to characterize but likely hyperdense cyst. Urinary bladder is distended but no wall thickening. Stomach/Bowel: Gastrojejunostomy tube in the stomach with balloon appropriately positioned and opposed to the anterior abdominal wall. Tubing is looped in the stomach, tip of the tube is in the 3/4 portion of the duodenum. There is no abnormality along the subcutaneous portion of the gastrostomy tube. Overall stomach is decompressed. Chain sutures about the anterior stomach. Median ventral abdominal wall hernia contains short segment of transverse colon. There is no associated wall thickening or obstruction. Multifocal colonic diverticulosis without diverticulitis. There is also mild distal ileal diverticulosis. No small bowel obstruction. Enteric anastomosis in the left abdomen  again seen, with slightly patulous appearance of the anastomotic segment, but noninflamed. Vascular/Lymphatic: Aortic atherosclerosis without aneurysm. Advanced bi-iliac atherosclerosis. Surgical clips within the right groin likely at site of prior vascular access. There is no portal venous or mesenteric gas. No bulky abdominopelvic adenopathy. Reproductive: Normal for age uterine atrophy.  No adnexal mass. Other: No free air or ascites. Ventral abdominal wall hernia, right paramedian hernia contains a nonobstructed noninflamed transverse colon. Left paramidline hernia contains only fat. Anterior to the left hip in the subcutaneous tissues is a 2.7 x 1.5 cm ovoid soft tissue density. This was present on multiple prior exams, and is stable over the course of 10 months, but overall increased in size from 2020 exam. Assessment margins and no surrounding inflammation. Musculoskeletal: The bones are diffusely under mineralized. No acute osseous findings. Minimal soft tissue thickening overlying the sacrum but no evidence of  abscess no focal fluid collection. IMPRESSION: 1. No acute findings. 2. Gastrojejunostomy tube in the stomach with balloon appropriately positioned and opposed to the anterior abdominal wall. Tubing is looped in the stomach, tip of the tube is in the 3/4 portion of the duodenum. No abnormality along the subcutaneous portion of the gastrostomy tube. 3. Ventral abdominal wall hernias. Right paramedial hernia contains nonobstructed noninflamed transverse colon, and is unchanged from prior imaging. 4. Ovoid soft tissue density anterior to the left hip in the subcutaneous tissues measuring 2.7 x 1.5 cm, stable over the course of 10 months, but overall increased in size from 2020 exam. Etiology and significance is uncertain, however of doubtful clinical significance in a patient of this age. 5. Colonic diverticulosis without diverticulitis. Aortic Atherosclerosis (ICD10-I70.0). Electronically Signed   By: Narda Rutherford M.D.   On: 02/14/2021 19:54   CT Head Wo Contrast  Result Date: 02/14/2021 CLINICAL DATA:  Head trauma, minor (Age >= 65y) EXAM: CT HEAD WITHOUT CONTRAST TECHNIQUE: Contiguous axial images were obtained from the base of the skull through the vertex without intravenous contrast. COMPARISON:  12/28/2020 FINDINGS: Brain: There is no acute intracranial hemorrhage, mass effect, or edema. No new loss of gray-white differentiation. There is no extra-axial fluid collection. Prominence of the ventricles and sulci reflects stable parenchymal volume loss. Patchy hypoattenuation in the supratentorial white matter likely reflects stable chronic microvascular ischemic changes. 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 Vascular: There is atherosclerotic calcification at the skull base. Skull: Calvarium is unremarkable. Sinuses/Orbits: No acute finding. Other: None. IMPRESSION: No evidence of acute intracranial injury. Electronically Signed   By: Guadlupe Spanish M.D.   On: 02/14/2021 16:56   DG FEMUR  PORT, MIN 2 VIEWS RIGHT  Result Date: 02/14/2021 CLINICAL DATA:  Pain. EXAM: RIGHT FEMUR PORTABLE 2 VIEW COMPARISON:  None. FINDINGS: The bones are under mineralized. Cortical margins of the femur are intact. There is no evidence of fracture or other focal bone lesions. Hip and knee alignment are maintained. There is no periosteal reaction or bony destruction. Vascular calcifications. Soft tissues are otherwise unremarkable. IMPRESSION: No acute osseous abnormality or explanation for pain. Electronically Signed   By: Narda Rutherford M.D.   On: 02/14/2021 18:42   VAS [place] LOWER EXTREMITY VENOUS (DVT) (7a-7p)  Result Date: 02/14/2021  Lower Venous DVT Study Patient Name:  RAFFAELA MCFIELD  Date of Exam:   02/14/2021 Medical Rec #: [id number]      Accession #:    [id number] Date of Birth: 10-25-1923      Patient Gender: F Patient Age:   16 years Exam Location:  [facility] Procedure:      VAS [place] LOWER EXTREMITY VENOUS (DVT) Referring Phys: Griffin Basil ROEMHILDT --------------------------------------------------------------------------------  Indications: Edema, and Swelling.  Comparison Study: no prior Performing Technologist: [place] Ponder RVS  Examination Guidelines: A complete evaluation includes B-mode imaging, spectral Doppler, color Doppler, and power Doppler as needed of all accessible portions of each vessel. Bilateral testing is considered an integral part of a complete examination. Limited examinations for reoccurring indications may be performed as noted. The reflux portion of the exam is performed with the patient in reverse Trendelenburg.  +---------+---------------+---------+-----------+----------+---------------+ RIGHT    CompressibilityPhasicitySpontaneityPropertiesThrombus Aging  +---------+---------------+---------+-----------+----------+---------------+ CFV      Full           Yes      Yes                                   +---------+---------------+---------+-----------+----------+---------------+ SFJ      Full                                                         +---------+---------------+---------+-----------+----------+---------------+ FV Prox  Full                                                         +---------+---------------+---------+-----------+----------+---------------+ FV Mid   Full                                                         +---------+---------------+---------+-----------+----------+---------------+ FV DistalFull                                                         +---------+---------------+---------+-----------+----------+---------------+ PFV      Full                                                         +---------+---------------+---------+-----------+----------+---------------+ POP      Full           Yes      Yes                                  +---------+---------------+---------+-----------+----------+---------------+ PTV      Full                                                         +---------+---------------+---------+-----------+----------+---------------+ PERO  Full                                         patent by color +---------+---------------+---------+-----------+----------+---------------+   +----+---------------+---------+-----------+----------+--------------+ LEFTCompressibilityPhasicitySpontaneityPropertiesThrombus Aging +----+---------------+---------+-----------+----------+--------------+ CFV Full           Yes      Yes                                 +----+---------------+---------+-----------+----------+--------------+     Summary: RIGHT: - There is no evidence of deep vein thrombosis in the lower extremity.  - No cystic structure found in the popliteal fossa.  LEFT: - No evidence of common femoral vein obstruction.  *See table(s) above for measurements and observations. Electronically signed by  Sherald Hess MD on 02/14/2021 at 4:45:32 PM.    Final     EKG: Independently reviewed. NSR rate 81  Assessment/Plan Principal Problem:   GI bleed Active Problems:   Type 2 diabetes mellitus with chronic kidney disease (HCC)   ABLA (acute blood loss anemia)   CKD (chronic kidney disease) stage 3, GFR 30-59 ml/min (HCC)   Accelerated hypertension   Chronic diastolic CHF (congestive heart failure) (HCC)   PEG (percutaneous endoscopic gastrostomy) status (HCC)   Atherosclerosis of native arteries of the extremities with gangrene (HCC)   AKI (acute kidney injury) (HCC)    GIB with ABLA - Most recent prior GIB was due to GERD induced esophagitis in July PPI bolus and gtt 2u PRBC transfusion H/H Q6H Tele monitor GI consult in AM NPO Holding tube feeds for now except FWF and meds PAD - Holding ASA+Plavix in setting of acute bleed Continue statin Wound care consult for ulcers Med rec pending to determine if shes on any systemic ABx for the osteomyelitis of foot, though doesn't appear to be based on Dr. Logan Bores (podiatry) office note from 12 days ago. AKI on CKD 3 - Patient has acute kidney injury.  Patient has one of the following: Increase in Serum Creatinine >0.3 mg/dL within 16X Increase in Serum Creatinine > 1.5 times baseline known or presumed to have been within the last 7 days Urine volume < 0.5 ml/kg/hr for 6 hours Likely due to GIB Hold diuretics IVF in form of PRBC transfusions Strict intake and output Repeat BMP in AM dCHF - Holding diuretics HTN - Cont amlodipine and hydralazine DM2 - Chart diagnosis - not on any chronic meds for this and recent A1Cs have been good  DVT prophylaxis: SCDs Code Status: DNR Family Communication: No family in room Disposition Plan: Home after ABLA resolved / GIB stopped Consults called: Message sent to Dr. Russella Dar for AM GI consult Admission status: Admit to inpatient  Severity of Illness: The appropriate patient status for  this patient is INPATIENT. Inpatient status is judged to be reasonable and necessary in order to provide the required intensity of service to ensure the patient's safety. The patient's presenting symptoms, physical exam findings, and initial radiographic and laboratory data in the context of their chronic comorbidities is felt to place them at high risk for further clinical deterioration. Furthermore, it is not anticipated that the patient will be medically stable for discharge from the hospital within 2 midnights of admission.   IP status due to GIB with ABLA requiring PRBC transfusions.  * I certify that at the point of admission it  is my clinical judgment that the patient will require inpatient hospital care spanning beyond 2 midnights from the point of admission due to high intensity of service, high risk for further deterioration and high frequency of surveillance required.*   Taneya Conkel M. DO [facility]  How to contact the [facility] Attending or Consulting provider 7A - 7P or covering provider during after hours 7P -7A, for this patient?  Check the care team in [facility] and look for a) attending/consulting [facility] provider listed and b) the [facility] team listed Log into [email or web address]  Amion Physician Scheduling and messaging for groups and whole hospitals  On call and physician scheduling software for group practices, residents, hospitalists and other medical providers for call, clinic, rotation and shift schedules. On[employer] is a hospital-wide system for scheduling doctors and paging doctors on call. EasyPlot is for scientific plotting and data analysis.  [email or web address]  and use [facility] universal password to access. If you do not have the password, please contact the hospital operator.  Locate the [facility] provider you are looking for under [facility] and page to a number that you can be directly reached. If you still have difficulty reaching the provider, please page the [facility]  (Director on Call) for the Hospitalists listed on amion for assistance.  02/14/2021, 8:24 PM

## 2021-02-14 NOTE — ED Notes (Signed)
Patient transported to CT 

## 2021-02-15 DIAGNOSIS — I10 Essential (primary) hypertension: Secondary | ICD-10-CM | POA: Diagnosis not present

## 2021-02-15 DIAGNOSIS — D62 Acute posthemorrhagic anemia: Secondary | ICD-10-CM | POA: Diagnosis not present

## 2021-02-15 DIAGNOSIS — N179 Acute kidney failure, unspecified: Secondary | ICD-10-CM | POA: Diagnosis not present

## 2021-02-15 DIAGNOSIS — K921 Melena: Secondary | ICD-10-CM | POA: Diagnosis not present

## 2021-02-15 LAB — BASIC METABOLIC PANEL
Anion gap: 9 (ref 5–15)
BUN: 67 mg/dL — ABNORMAL HIGH (ref 8–23)
CO2: 22 mmol/L (ref 22–32)
Calcium: 9.2 mg/dL (ref 8.9–10.3)
Chloride: 112 mmol/L — ABNORMAL HIGH (ref 98–111)
Creatinine, Ser: 1.37 mg/dL — ABNORMAL HIGH (ref 0.44–1.00)
GFR, Estimated: 36 mL/min — ABNORMAL LOW (ref >60)
Glucose, Bld: 118 mg/dL — ABNORMAL HIGH (ref 70–99)
Potassium: 4.2 mmol/L (ref 3.5–5.1)
Sodium: 143 mmol/L (ref 135–145)

## 2021-02-15 LAB — HEMOGLOBIN AND HEMATOCRIT, BLOOD
HCT: 22.1 % — ABNORMAL LOW (ref 36.0–46.0)
HCT: 28 % — ABNORMAL LOW (ref 36.0–46.0)
HCT: 27.4 % — ABNORMAL LOW (ref 36.0–46.0)
Hemoglobin: 7.1 g/dL — ABNORMAL LOW (ref 12.0–15.0)
Hemoglobin: 9.4 g/dL — ABNORMAL LOW (ref 12.0–15.0)
Hemoglobin: 9.3 g/dL — ABNORMAL LOW (ref 12.0–15.0)

## 2021-02-15 MED ORDER — HYDRALAZINE HCL 50 MG PO TABS
50.0000 mg | ORAL_TABLET | Freq: Three times a day (TID) | ORAL | Status: DC
  Administered 2021-02-15 – 2021-02-16 (×3): 50 mg via ORAL
  Filled 2021-02-15 (×3): qty 1

## 2021-02-15 MED ORDER — OSMOLITE 1.5 CAL PO LIQD
711.0000 mL | ORAL | Status: DC
  Administered 2021-02-15: 711 mL
  Filled 2021-02-15 (×2): qty 1000

## 2021-02-15 MED ORDER — FREE WATER
180.0000 mL | Freq: Three times a day (TID) | Status: DC
  Administered 2021-02-15: 180 mL

## 2021-02-15 MED ORDER — SUCRALFATE 1 GM/10ML PO SUSP
1.0000 g | Freq: Four times a day (QID) | ORAL | Status: DC
  Administered 2021-02-15 – 2021-02-16 (×5): 1 g via ORAL
  Filled 2021-02-15 (×5): qty 10

## 2021-02-15 MED ORDER — DULOXETINE HCL 30 MG PO CPEP
30.0000 mg | ORAL_CAPSULE | Freq: Every day | ORAL | Status: DC
  Administered 2021-02-15 – 2021-02-16 (×2): 30 mg via ORAL
  Filled 2021-02-15 (×2): qty 1

## 2021-02-15 MED ORDER — AMLODIPINE BESYLATE 5 MG PO TABS
5.0000 mg | ORAL_TABLET | Freq: Every day | ORAL | Status: DC
  Administered 2021-02-15 – 2021-02-16 (×2): 5 mg via ORAL
  Filled 2021-02-15 (×2): qty 1

## 2021-02-15 MED ORDER — LEVOTHYROXINE SODIUM 25 MCG PO TABS
125.0000 ug | ORAL_TABLET | Freq: Every day | ORAL | Status: DC
  Administered 2021-02-16: 125 ug via ORAL
  Filled 2021-02-15: qty 1

## 2021-02-15 MED ORDER — LATANOPROST 0.005 % OP SOLN
1.0000 [drp] | Freq: Every day | OPHTHALMIC | Status: DC
  Administered 2021-02-15: 1 [drp] via OPHTHALMIC
  Filled 2021-02-15: qty 2.5

## 2021-02-15 MED ORDER — OXYCODONE HCL 5 MG PO TABS: 2.5000 mg | ORAL_TABLET | Freq: Three times a day (TID) | ORAL | Status: DC | PRN

## 2021-02-15 MED ORDER — ATORVASTATIN CALCIUM 10 MG PO TABS
10.0000 mg | ORAL_TABLET | Freq: Every day | ORAL | Status: DC
  Administered 2021-02-15 – 2021-02-16 (×2): 10 mg via ORAL
  Filled 2021-02-15 (×2): qty 1

## 2021-02-15 MED ORDER — PANTOPRAZOLE SODIUM 40 MG PO TBEC
40.0000 mg | DELAYED_RELEASE_TABLET | Freq: Two times a day (BID) | ORAL | Status: DC
  Administered 2021-02-15 – 2021-02-16 (×3): 40 mg via ORAL
  Filled 2021-02-15 (×3): qty 1

## 2021-02-15 MED ORDER — PANTOPRAZOLE 2 MG/ML SUSPENSION: 40.0000 mg | Freq: Two times a day (BID) | ORAL | Status: DC

## 2021-02-15 NOTE — ED Notes (Signed)
Unable to collect 0500 BMP due to blood transfusion on going

## 2021-02-15 NOTE — Progress Notes (Signed)
PROGRESS NOTE  Shannon [name]  WCH:[id number] DOB: [birth year] DOA: 02/14/2021 PCP: [name], NP  Outpatient Specialists: GI, Dr. Henrene Pastor Brief Narrative: Shannon [name] is a 87 y.o. female with a history of DVT previously on eliquis and coumadin, GI bleeding for which anticoagulation has been stopped, pyloric stenosis s/p gastrojejunostomy 2017, bleeding esophagitis found on EGD July 2022, chronic HFpEF, PAF, HTN, diet-controlled T2DM, stage IIIb CKD, COPD on home oxygen, PVD s/p left popliteal angioplasty July 2022, toe/transmetatarsal amputations September 2022 on DAPT who presented to the ED 11/14 with symptomatic anemia for a week without definite GI bleeding. Hemoglobin was 5.3, rising to 7.1 after 2u PRBCs. Dark stools were +FOBT. She was admitted and GI consulted as plavix was held. BUN 91, SCr 1.8 (up from baseline of ~40 and ~1.2, respectively).   Assessment & Plan: Principal Problem:   GI bleed Active Problems:   Type 2 diabetes mellitus with chronic kidney disease (HCC)   ABLA (acute blood loss anemia)   CKD (chronic kidney disease) stage 3, GFR 30-59 ml/min (HCC)   Accelerated hypertension   Chronic diastolic CHF (congestive heart failure) (HCC)   PEG (percutaneous endoscopic gastrostomy) status ([place])   Atherosclerosis of native arteries of the extremities with gangrene (HCC)   AKI (acute kidney injury) ([place])  Acute on chronic blood loss and iron deficiency anemia due to recurrent GI bleeding: No definite source found on CT, though severe esophagitis is likely causative.  - GI consulted, no intervention currently planned. Continue PPI IV and adding carafate.  - Check H/H serially, hgb into 9's now - Defer diet order to GI. Can give feeds thru gastrostomy overnight if ok.  PVD s/p angioplasty and toe/metatarsal amputations with chronic wound that does not currently appear infected: Has recently completed abx for osteomyelitis of the foot.  - Continue outpatient podiatry  follow up with Dr. Amalia Hailey - Continue outpatient vascular follow up with Dr. Stanford Breed.  - WOC consulted - Holding antiplatelets, continue statin.   AKI on stage IIIb CKD: With concomitant elevation in BUN (muddy picture w/upper GIB).  - Expanding volume with transfusions, improving.  - Avoid nephrotoxins, recheck metabolic profile in AM  History of DVT, PAF:  - No longer on anticoagulation due to recurrent transfusion-dependent GI bleeds.   Chronic hypoxic respiratory failure, COPD: No acute exacerbation.  - Continue home O2 and bronchodilators.   Diet-controlled T2DM:  - Monitor   Chronic HFpEF, HTN:  - Will restart home norvasc, hydralazine with elevated BPs.  - Hold diuretic with AKI for now.   Hypothyroidism: Recent TSH 0.41. - Continue synthroid.   DVT prophylaxis: SCDs Code Status: DNR, POA Family Communication: None at bedside. Spoke to daughter on the phone this evening Disposition Plan:  Status is: Inpatient  Remains inpatient appropriate because: Continued monitoring of transfusion-dependent GI bleeding.   Consultants:  GI  Procedures:  None  Antimicrobials: None   Subjective: No further bleeding noted, though does feel quite weak when examined this morning. No other bleeding. No chest pain or leg pain.   Objective: Vitals:   02/15/21 1330 02/15/21 1400 02/15/21 1430 02/15/21 1535  BP: (!) 176/69 (!) 187/166 (!) 185/69 (!) 180/72  Pulse:    85  Resp: 16 (!) 26 15 18   Temp:      TempSrc:      SpO2:    100%  Weight:      Height:        Intake/Output Summary (Last 24 hours) at 02/15/2021 1717  PROGRESS NOTE  Shannon [name]  WCH:[id number] DOB: [birth year] DOA: 02/14/2021 PCP: [name], NP  Outpatient Specialists: GI, Dr. Henrene Pastor Brief Narrative: Shannon [name] is a 87 y.o. female with a history of DVT previously on eliquis and coumadin, GI bleeding for which anticoagulation has been stopped, pyloric stenosis s/p gastrojejunostomy 2017, bleeding esophagitis found on EGD July 2022, chronic HFpEF, PAF, HTN, diet-controlled T2DM, stage IIIb CKD, COPD on home oxygen, PVD s/p left popliteal angioplasty July 2022, toe/transmetatarsal amputations September 2022 on DAPT who presented to the ED 11/14 with symptomatic anemia for a week without definite GI bleeding. Hemoglobin was 5.3, rising to 7.1 after 2u PRBCs. Dark stools were +FOBT. She was admitted and GI consulted as plavix was held. BUN 91, SCr 1.8 (up from baseline of ~40 and ~1.2, respectively).   Assessment & Plan: Principal Problem:   GI bleed Active Problems:   Type 2 diabetes mellitus with chronic kidney disease (HCC)   ABLA (acute blood loss anemia)   CKD (chronic kidney disease) stage 3, GFR 30-59 ml/min (HCC)   Accelerated hypertension   Chronic diastolic CHF (congestive heart failure) (HCC)   PEG (percutaneous endoscopic gastrostomy) status ([place])   Atherosclerosis of native arteries of the extremities with gangrene (HCC)   AKI (acute kidney injury) ([place])  Acute on chronic blood loss and iron deficiency anemia due to recurrent GI bleeding: No definite source found on CT, though severe esophagitis is likely causative.  - GI consulted, no intervention currently planned. Continue PPI IV and adding carafate.  - Check H/H serially, hgb into 9's now - Defer diet order to GI. Can give feeds thru gastrostomy overnight if ok.  PVD s/p angioplasty and toe/metatarsal amputations with chronic wound that does not currently appear infected: Has recently completed abx for osteomyelitis of the foot.  - Continue outpatient podiatry  follow up with Dr. Amalia Hailey - Continue outpatient vascular follow up with Dr. Stanford Breed.  - WOC consulted - Holding antiplatelets, continue statin.   AKI on stage IIIb CKD: With concomitant elevation in BUN (muddy picture w/upper GIB).  - Expanding volume with transfusions, improving.  - Avoid nephrotoxins, recheck metabolic profile in AM  History of DVT, PAF:  - No longer on anticoagulation due to recurrent transfusion-dependent GI bleeds.   Chronic hypoxic respiratory failure, COPD: No acute exacerbation.  - Continue home O2 and bronchodilators.   Diet-controlled T2DM:  - Monitor   Chronic HFpEF, HTN:  - Will restart home norvasc, hydralazine with elevated BPs.  - Hold diuretic with AKI for now.   Hypothyroidism: Recent TSH 0.41. - Continue synthroid.   DVT prophylaxis: SCDs Code Status: DNR, POA Family Communication: None at bedside. Spoke to daughter on the phone this evening Disposition Plan:  Status is: Inpatient  Remains inpatient appropriate because: Continued monitoring of transfusion-dependent GI bleeding.   Consultants:  GI  Procedures:  None  Antimicrobials: None   Subjective: No further bleeding noted, though does feel quite weak when examined this morning. No other bleeding. No chest pain or leg pain.   Objective: Vitals:   02/15/21 1330 02/15/21 1400 02/15/21 1430 02/15/21 1535  BP: (!) 176/69 (!) 187/166 (!) 185/69 (!) 180/72  Pulse:    85  Resp: 16 (!) 26 15 18   Temp:      TempSrc:      SpO2:    100%  Weight:      Height:        Intake/Output Summary (Last 24 hours) at 02/15/2021 1717  PROGRESS NOTE  Shannon [name]  WCH:[id number] DOB: [birth year] DOA: 02/14/2021 PCP: [name], NP  Outpatient Specialists: GI, Dr. Henrene Pastor Brief Narrative: Shannon [name] is a 87 y.o. female with a history of DVT previously on eliquis and coumadin, GI bleeding for which anticoagulation has been stopped, pyloric stenosis s/p gastrojejunostomy 2017, bleeding esophagitis found on EGD July 2022, chronic HFpEF, PAF, HTN, diet-controlled T2DM, stage IIIb CKD, COPD on home oxygen, PVD s/p left popliteal angioplasty July 2022, toe/transmetatarsal amputations September 2022 on DAPT who presented to the ED 11/14 with symptomatic anemia for a week without definite GI bleeding. Hemoglobin was 5.3, rising to 7.1 after 2u PRBCs. Dark stools were +FOBT. She was admitted and GI consulted as plavix was held. BUN 91, SCr 1.8 (up from baseline of ~40 and ~1.2, respectively).   Assessment & Plan: Principal Problem:   GI bleed Active Problems:   Type 2 diabetes mellitus with chronic kidney disease (HCC)   ABLA (acute blood loss anemia)   CKD (chronic kidney disease) stage 3, GFR 30-59 ml/min (HCC)   Accelerated hypertension   Chronic diastolic CHF (congestive heart failure) (HCC)   PEG (percutaneous endoscopic gastrostomy) status ([place])   Atherosclerosis of native arteries of the extremities with gangrene (HCC)   AKI (acute kidney injury) ([place])  Acute on chronic blood loss and iron deficiency anemia due to recurrent GI bleeding: No definite source found on CT, though severe esophagitis is likely causative.  - GI consulted, no intervention currently planned. Continue PPI IV and adding carafate.  - Check H/H serially, hgb into 9's now - Defer diet order to GI. Can give feeds thru gastrostomy overnight if ok.  PVD s/p angioplasty and toe/metatarsal amputations with chronic wound that does not currently appear infected: Has recently completed abx for osteomyelitis of the foot.  - Continue outpatient podiatry  follow up with Dr. Amalia Hailey - Continue outpatient vascular follow up with Dr. Stanford Breed.  - WOC consulted - Holding antiplatelets, continue statin.   AKI on stage IIIb CKD: With concomitant elevation in BUN (muddy picture w/upper GIB).  - Expanding volume with transfusions, improving.  - Avoid nephrotoxins, recheck metabolic profile in AM  History of DVT, PAF:  - No longer on anticoagulation due to recurrent transfusion-dependent GI bleeds.   Chronic hypoxic respiratory failure, COPD: No acute exacerbation.  - Continue home O2 and bronchodilators.   Diet-controlled T2DM:  - Monitor   Chronic HFpEF, HTN:  - Will restart home norvasc, hydralazine with elevated BPs.  - Hold diuretic with AKI for now.   Hypothyroidism: Recent TSH 0.41. - Continue synthroid.   DVT prophylaxis: SCDs Code Status: DNR, POA Family Communication: None at bedside. Spoke to daughter on the phone this evening Disposition Plan:  Status is: Inpatient  Remains inpatient appropriate because: Continued monitoring of transfusion-dependent GI bleeding.   Consultants:  GI  Procedures:  None  Antimicrobials: None   Subjective: No further bleeding noted, though does feel quite weak when examined this morning. No other bleeding. No chest pain or leg pain.   Objective: Vitals:   02/15/21 1330 02/15/21 1400 02/15/21 1430 02/15/21 1535  BP: (!) 176/69 (!) 187/166 (!) 185/69 (!) 180/72  Pulse:    85  Resp: 16 (!) 26 15 18   Temp:      TempSrc:      SpO2:    100%  Weight:      Height:        Intake/Output Summary (Last 24 hours) at 02/15/2021 1717  PROGRESS NOTE  Shannon [name]  WCH:[id number] DOB: [birth year] DOA: 02/14/2021 PCP: [name], NP  Outpatient Specialists: GI, Dr. Henrene Pastor Brief Narrative: Shannon [name] is a 87 y.o. female with a history of DVT previously on eliquis and coumadin, GI bleeding for which anticoagulation has been stopped, pyloric stenosis s/p gastrojejunostomy 2017, bleeding esophagitis found on EGD July 2022, chronic HFpEF, PAF, HTN, diet-controlled T2DM, stage IIIb CKD, COPD on home oxygen, PVD s/p left popliteal angioplasty July 2022, toe/transmetatarsal amputations September 2022 on DAPT who presented to the ED 11/14 with symptomatic anemia for a week without definite GI bleeding. Hemoglobin was 5.3, rising to 7.1 after 2u PRBCs. Dark stools were +FOBT. She was admitted and GI consulted as plavix was held. BUN 91, SCr 1.8 (up from baseline of ~40 and ~1.2, respectively).   Assessment & Plan: Principal Problem:   GI bleed Active Problems:   Type 2 diabetes mellitus with chronic kidney disease (HCC)   ABLA (acute blood loss anemia)   CKD (chronic kidney disease) stage 3, GFR 30-59 ml/min (HCC)   Accelerated hypertension   Chronic diastolic CHF (congestive heart failure) (HCC)   PEG (percutaneous endoscopic gastrostomy) status ([place])   Atherosclerosis of native arteries of the extremities with gangrene (HCC)   AKI (acute kidney injury) ([place])  Acute on chronic blood loss and iron deficiency anemia due to recurrent GI bleeding: No definite source found on CT, though severe esophagitis is likely causative.  - GI consulted, no intervention currently planned. Continue PPI IV and adding carafate.  - Check H/H serially, hgb into 9's now - Defer diet order to GI. Can give feeds thru gastrostomy overnight if ok.  PVD s/p angioplasty and toe/metatarsal amputations with chronic wound that does not currently appear infected: Has recently completed abx for osteomyelitis of the foot.  - Continue outpatient podiatry  follow up with Dr. Amalia Hailey - Continue outpatient vascular follow up with Dr. Stanford Breed.  - WOC consulted - Holding antiplatelets, continue statin.   AKI on stage IIIb CKD: With concomitant elevation in BUN (muddy picture w/upper GIB).  - Expanding volume with transfusions, improving.  - Avoid nephrotoxins, recheck metabolic profile in AM  History of DVT, PAF:  - No longer on anticoagulation due to recurrent transfusion-dependent GI bleeds.   Chronic hypoxic respiratory failure, COPD: No acute exacerbation.  - Continue home O2 and bronchodilators.   Diet-controlled T2DM:  - Monitor   Chronic HFpEF, HTN:  - Will restart home norvasc, hydralazine with elevated BPs.  - Hold diuretic with AKI for now.   Hypothyroidism: Recent TSH 0.41. - Continue synthroid.   DVT prophylaxis: SCDs Code Status: DNR, POA Family Communication: None at bedside. Spoke to daughter on the phone this evening Disposition Plan:  Status is: Inpatient  Remains inpatient appropriate because: Continued monitoring of transfusion-dependent GI bleeding.   Consultants:  GI  Procedures:  None  Antimicrobials: None   Subjective: No further bleeding noted, though does feel quite weak when examined this morning. No other bleeding. No chest pain or leg pain.   Objective: Vitals:   02/15/21 1330 02/15/21 1400 02/15/21 1430 02/15/21 1535  BP: (!) 176/69 (!) 187/166 (!) 185/69 (!) 180/72  Pulse:    85  Resp: 16 (!) 26 15 18   Temp:      TempSrc:      SpO2:    100%  Weight:      Height:        Intake/Output Summary (Last 24 hours) at 02/15/2021 1717  PROGRESS NOTE  Shannon [name]  WCH:[id number] DOB: [birth year] DOA: 02/14/2021 PCP: [name], NP  Outpatient Specialists: GI, Dr. Henrene Pastor Brief Narrative: Shannon [name] is a 87 y.o. female with a history of DVT previously on eliquis and coumadin, GI bleeding for which anticoagulation has been stopped, pyloric stenosis s/p gastrojejunostomy 2017, bleeding esophagitis found on EGD July 2022, chronic HFpEF, PAF, HTN, diet-controlled T2DM, stage IIIb CKD, COPD on home oxygen, PVD s/p left popliteal angioplasty July 2022, toe/transmetatarsal amputations September 2022 on DAPT who presented to the ED 11/14 with symptomatic anemia for a week without definite GI bleeding. Hemoglobin was 5.3, rising to 7.1 after 2u PRBCs. Dark stools were +FOBT. She was admitted and GI consulted as plavix was held. BUN 91, SCr 1.8 (up from baseline of ~40 and ~1.2, respectively).   Assessment & Plan: Principal Problem:   GI bleed Active Problems:   Type 2 diabetes mellitus with chronic kidney disease (HCC)   ABLA (acute blood loss anemia)   CKD (chronic kidney disease) stage 3, GFR 30-59 ml/min (HCC)   Accelerated hypertension   Chronic diastolic CHF (congestive heart failure) (HCC)   PEG (percutaneous endoscopic gastrostomy) status ([place])   Atherosclerosis of native arteries of the extremities with gangrene (HCC)   AKI (acute kidney injury) ([place])  Acute on chronic blood loss and iron deficiency anemia due to recurrent GI bleeding: No definite source found on CT, though severe esophagitis is likely causative.  - GI consulted, no intervention currently planned. Continue PPI IV and adding carafate.  - Check H/H serially, hgb into 9's now - Defer diet order to GI. Can give feeds thru gastrostomy overnight if ok.  PVD s/p angioplasty and toe/metatarsal amputations with chronic wound that does not currently appear infected: Has recently completed abx for osteomyelitis of the foot.  - Continue outpatient podiatry  follow up with Dr. Amalia Hailey - Continue outpatient vascular follow up with Dr. Stanford Breed.  - WOC consulted - Holding antiplatelets, continue statin.   AKI on stage IIIb CKD: With concomitant elevation in BUN (muddy picture w/upper GIB).  - Expanding volume with transfusions, improving.  - Avoid nephrotoxins, recheck metabolic profile in AM  History of DVT, PAF:  - No longer on anticoagulation due to recurrent transfusion-dependent GI bleeds.   Chronic hypoxic respiratory failure, COPD: No acute exacerbation.  - Continue home O2 and bronchodilators.   Diet-controlled T2DM:  - Monitor   Chronic HFpEF, HTN:  - Will restart home norvasc, hydralazine with elevated BPs.  - Hold diuretic with AKI for now.   Hypothyroidism: Recent TSH 0.41. - Continue synthroid.   DVT prophylaxis: SCDs Code Status: DNR, POA Family Communication: None at bedside. Spoke to daughter on the phone this evening Disposition Plan:  Status is: Inpatient  Remains inpatient appropriate because: Continued monitoring of transfusion-dependent GI bleeding.   Consultants:  GI  Procedures:  None  Antimicrobials: None   Subjective: No further bleeding noted, though does feel quite weak when examined this morning. No other bleeding. No chest pain or leg pain.   Objective: Vitals:   02/15/21 1330 02/15/21 1400 02/15/21 1430 02/15/21 1535  BP: (!) 176/69 (!) 187/166 (!) 185/69 (!) 180/72  Pulse:    85  Resp: 16 (!) 26 15 18   Temp:      TempSrc:      SpO2:    100%  Weight:      Height:        Intake/Output Summary (Last 24 hours) at 02/15/2021 1717  Accession #:    [id number] Date of Birth: 05-Oct-1923      Patient Gender: F Patient Age:   57 years Exam Location:  [facility] Procedure:      VAS [place] LOWER EXTREMITY VENOUS (DVT) Referring Phys: Audrie Gallus ROEMHILDT --------------------------------------------------------------------------------  Indications: Edema, and Swelling.  Comparison Study: no prior Performing Technologist: Archie Patten RVS  Examination Guidelines: A complete evaluation includes B-mode imaging, spectral Doppler, color Doppler, and power Doppler as needed of all accessible portions of each vessel. Bilateral testing is considered an integral part of a complete examination. Limited examinations for  reoccurring indications may be performed as noted. The reflux portion of the exam is performed with the patient in reverse Trendelenburg.  +---------+---------------+---------+-----------+----------+---------------+ RIGHT    CompressibilityPhasicitySpontaneityPropertiesThrombus Aging  +---------+---------------+---------+-----------+----------+---------------+ CFV      Full           Yes      Yes                                  +---------+---------------+---------+-----------+----------+---------------+ SFJ      Full                                                         +---------+---------------+---------+-----------+----------+---------------+ FV Prox  Full                                                         +---------+---------------+---------+-----------+----------+---------------+ FV Mid   Full                                                         +---------+---------------+---------+-----------+----------+---------------+ FV DistalFull                                                         +---------+---------------+---------+-----------+----------+---------------+ PFV      Full                                                         +---------+---------------+---------+-----------+----------+---------------+ POP      Full           Yes      Yes                                  +---------+---------------+---------+-----------+----------+---------------+ PTV      Full                                                         +---------+---------------+---------+-----------+----------+---------------+

## 2021-02-15 NOTE — Consult Note (Addendum)
[facility] Consult: 8:15 AM 02/15/2021  LOS: 1 day    Referring Provider: Dr Mathews Robinsons MD  Primary Care Physician:  [name], NP Primary Gastroenterologist:  Dr. Scarlette Shorts.      Reason for Consultation:  GIB in pt on plavix.     HPI: Shannon [name] is a 87 y.o. female.  PMH diet-controlled diabetes.  PVD with angioplasties.  DVT, prior Coumadin, prior Eliquis both discontinued due to bleeding.  CHF.  PAF.  Hypertension.  Hypothyroidism.  CKD.  COPD.  Home oxygen.  Anemia requiring blood transfusions.  S/p toe, transmetatarsal amputations.   GI Hx:  Chronic pyloric stenosis, s/p gastrojejunostomy tube 2017.  Perforated gastric ulcer.  Paraesophageal hernia.  Surgeries include cholecystectomy, Colectomy, 2016 exploratory laparotomy.  2015 ventral hernia repair, tubal ligation..   2017 EGD showed NG tube trauma/esophagitis.  He dilated pyloric stenosis to 15 mm. 10/11/2020 bleeding at Nunez tube site, melena in setting of INR 7.5.  Bleeding recurred after silver nitrate applied.  GJ tube balloon had ruptured and was changed out 7/5. 10/12/2020 EGD, Dr. Carlean Purl.  Grade C esophagitis with bleeding.  Benign esophageal stenosis dilated only with scope.  Multiple gastric polyps.  Gastrostomy present.  Iron pigment in proximal stomach.  Meds changed to BID PPI, sulcralfate, reflux precautions initiated.  Medical team decided not to restart warfarin.  Received 3 PRBCs in 10/2020.  On chronic oral iron and B complex supplement.  Currently taking Protonix 40 bid but not Sucralfate.    Recent interventions for bilateral foot ulcers include:  10/20/2020 L popliteal angioplasty.   12/24/2020 R LE angiogram without intervention, demonstrated significant SFA, popliteal, anterior tibial, posterior tibial atherosclerosis.   12/26/2020 Bilateral irrigation, debridement, bone biopsy  Metabolic encephalopathy and osteomyelitis addressed during admission 9/20 -12/31/2020.  Hgb Nadir 8.3, 9.2 at discharge, MCV 95.    Presented from home with lethargy, weakness for about 1 week.  Family had not observed any bloody or melenic stool.  Patient reports discomfort and greenish, foul-smelling discharge from the G-tube.  No nausea or vomiting.  Continues on nocturnal tube feeds.  During the day she drinks liquids and eats Jell-O, pudding, ice cream.  Recent completion of antibiotics for osteomyelitis/foot infections. Hgb 5.3.  MCV 105.  S/p 2 PRBCs, Hgb this AM 7.1.  INR 1.2.  FOBT +.   Current GFR 26, was in mid to upper 40s in September. Non con CTAP: GJ tube and balloon positioned appropriately in stomach, some looping of tube in the tip extends into the duodenum.  Abdominal wall hernias.  Stable, right paramedial hernia containing nonobstructing, benign-appearing transverse colon.  Soft tissue density of SQ tissues in left hip 2.7 x 1.5 cm, stable for 10 months but increased compared with 2020, doubt clinically significant.  Colonic diverticulosis. CT head with nothing acute.  Stable parenchymal volume loss, stable chronic microvascular ischemia, chronic small cerebellar infarcts. On exam stools were dark.  Tested FOBT +.        Past Medical History:  Diagnosis Date   Acute respiratory failure (  [place])    Anemia    previous blood transfusions   Arthritis    "all over"   Asthma    Atrial flutter (H[facility])    Bradycardia    requiring previous d/c of BB and reduction of amiodarone   CAD (coronary artery disease)    nonobstructive per notes   Chronic diastolic CHF (congestive heart failure) ([place])    CKD (chronic kidney disease), stage III ([place])    Complication of blood transfusion    "got the wrong blood type at [facility] in ~ 2015; no adverse reaction that we are aware of"/daughter, [name] (01/27/2016)   COPD (chronic  obstructive pulmonary disease) ([place])    Depression    "light case"   DVT (deep venous thrombosis) ([place]) 01/2016   a. LLE DVT 01/2016 - switched from Eliquis to Coumadin.   Dyspnea    with some activity   Gastric stenosis    a. s/p stomach tube   GERD (gastroesophageal reflux disease)    Headache    History of 2019 novel coronavirus disease (COVID-19)    History of blood transfusion    "several" (01/27/2016)   History of stomach ulcers    Hyperlipidemia    Hypertension    Hypothyroidism    On home oxygen therapy    "2L; 9PM til 9AM" (06/27/2017)   PAD (peripheral artery disease) (H[facility])    a. prior gangrene, toe amputations, intervention.   PAF (paroxysmal atrial fibrillation) ([place])    Paraesophageal hernia    Perforated gastric ulcer ([place])    Pneumonia    "a few times" (06/27/2017)   Seasonal allergies    SIADH (syndrome of inappropriate ADH production) ([place])    [name] 01/10/2015   Small bowel obstruction ([place])    "I don't know how many" (01/11/2015)   Stroke ([place])    several- no residual   Type II diabetes mellitus ([place])    "related to prednisone use  for > 20 yr; once predinose stopped; no more DM RX" (01/27/2016)   UTI (urinary tract infection) 02/08/2016   Ventral hernia with bowel obstruction     Past Surgical History:  Procedure Laterality Date   ABDOMINAL AORTOGRAM N/A 09/21/2016   Procedure: Abdominal Aortogram;  Surgeon: Waynetta Sandy, MD;  Location: [facility];  Service: Cardiovascular;  Laterality: N/A;   ABDOMINAL AORTOGRAM W/LOWER EXTREMITY N/A 10/20/2020   Procedure: ABDOMINAL AORTOGRAM W/LOWER EXTREMITY;  Surgeon: Cherre Robins, MD;  [facility];  Service: Cardiovascular;  Laterality: N/A;   AMPUTATION Left 09/25/2016   Procedure: AMPUTATION DIGIT- LEFT 2ND AND 3RD TOES;  Surgeon: Rosetta Posner, MD;  Location: [facility];  Service: Vascular;  Laterality: Left;   AMPUTATION Right 05/03/2018   Procedure: AMPUTATION RIGHT  GREAT TOE;  Surgeon: Rosetta Posner, MD;  [facility];  Service: Vascular;  Laterality: Right;   CATARACT EXTRACTION W/ INTRAOCULAR LENS  IMPLANT, BILATERAL     CHOLECYSTECTOMY OPEN     COLECTOMY     ESOPHAGOGASTRODUODENOSCOPY N/A 01/19/2014   Procedure: ESOPHAGOGASTRODUODENOSCOPY (EGD);  Surgeon: Irene Shipper, MD;  [facility];  Service: Endoscopy;  Laterality: N/A;   ESOPHAGOGASTRODUODENOSCOPY N/A 01/20/2014   Procedure: ESOPHAGOGASTRODUODENOSCOPY (EGD);  Surgeon: Irene Shipper, MD;  Location: [facility];  Service: Endoscopy;  Laterality: N/A;   ESOPHAGOGASTRODUODENOSCOPY N/A 03/19/2014   Procedure: ESOPHAGOGASTRODUODENOSCOPY (EGD);  Surgeon: Milus Banister, MD;  Location: [facility];  Service: Endoscopy;  Laterality: N/A;   ESOPHAGOGASTRODUODENOSCOPY N/A 07/08/2015  Procedure: ESOPHAGOGASTRODUODENOSCOPY (EGD);  Surgeon: Doran Stabler, MD;  Location: [facility];  Service: Endoscopy;  Laterality: N/A;   ESOPHAGOGASTRODUODENOSCOPY (EGD) WITH PROPOFOL N/A 09/15/2015   Procedure: ESOPHAGOGASTRODUODENOSCOPY (EGD) WITH PROPOFOL;  Surgeon: Manus Gunning, MD;  [facility];  Service: Gastroenterology;  Laterality: N/A;   ESOPHAGOGASTRODUODENOSCOPY (EGD) WITH PROPOFOL N/A 10/12/2020   Procedure: ESOPHAGOGASTRODUODENOSCOPY (EGD) WITH PROPOFOL;  Surgeon: Gatha Mayer, MD;  Location: [facility];  Service: Endoscopy;  Laterality: N/A;   FOOT SURGERY     GASTROJEJUNOSTOMY     hx/notes 01/10/2015   GASTROJEJUNOSTOMY N/A 09/23/2015   Procedure: OPEN GASTROJEJUNOSTOMY TUBE PLACEMENT;  Surgeon: Arta Bruce Kinsinger, MD;  [facility];  Service: General;  Laterality: N/A;   GLAUCOMA SURGERY Bilateral    HEMATOMA EVACUATION Right 10/20/2020   Procedure: EVACUATION HEMATOMA RIGHT GROIN;  Surgeon: Cherre Robins, MD;  Location: [facility];  Service: Vascular;  Laterality: Right;   IR CM INJ ANY COLONIC TUBE W/FLUORO  01/05/2017   IR CM INJ ANY COLONIC TUBE  W/FLUORO  06/07/2017   IR CM INJ ANY COLONIC TUBE W/FLUORO  11/05/2017   IR CM INJ ANY COLONIC TUBE W/FLUORO  09/18/2018   IR CM INJ ANY COLONIC TUBE W/FLUORO  03/06/2019   IR CM INJ ANY COLONIC TUBE W/FLUORO  03/11/2019   IR GASTR TUBE CONVERT GASTR-JEJ PER W/FL MOD SED  04/30/2020   IR GENERIC HISTORICAL  01/07/2016   IR GJ TUBE CHANGE 01/07/2016 Jacqulynn Cadet, MD [facility]   IR GENERIC HISTORICAL  01/27/2016   IR MECH REMOV OBSTRUC MAT ANY COLON TUBE W/FLUORO 01/27/2016 Markus Daft, MD [facility]   IR GENERIC HISTORICAL  02/07/2016   IR PATIENT EVAL TECH 0-60 MINS Darrell K Allred, PA-C [facility]   IR GENERIC HISTORICAL  02/08/2016   IR [place] TUBE CHANGE 02/08/2016 Greggory Keen, MD [facility]   IR GENERIC HISTORICAL  01/06/2016   IR [place] TUBE CHANGE 01/06/2016 [facility] OUT REF   IR GENERIC HISTORICAL  05/02/2016   IR CM INJ ANY COLONIC TUBE W/FLUORO 05/02/2016 Arne Cleveland, MD [facility]   IR GENERIC HISTORICAL  05/15/2016   IR GJ TUBE CHANGE 05/15/2016 Sandi Mariscal, MD [facility]   IR GENERIC HISTORICAL  06/28/2016   IR GJ TUBE CHANGE 06/28/2016 [facility]   IR GJ TUBE CHANGE  02/20/2017   IR GJ TUBE CHANGE  05/10/2017   IR GJ TUBE CHANGE  07/06/2017   IR GJ TUBE CHANGE  08/02/2017   IR GJ TUBE CHANGE  10/15/2017   IR GJ TUBE CHANGE  12/26/2017   IR GJ TUBE CHANGE  04/08/2018   IR GJ TUBE CHANGE  06/19/2018   IR GJ TUBE CHANGE  03/03/2019   IR GJ TUBE CHANGE  06/06/2019   IR GJ TUBE CHANGE  09/26/2019   IR GJ TUBE CHANGE  01/09/2020   IR GJ TUBE CHANGE  05/12/2020   IR GJ TUBE CHANGE  10/05/2020   IR GJ TUBE CHANGE  10/14/2020   IR GJ TUBE CHANGE  10/19/2020   IR PATIENT EVAL TECH 0-60 MINS  10/19/2016   IR PATIENT EVAL TECH 0-60 MINS  12/25/2016   IR PATIENT EVAL TECH 0-60 MINS  01/29/2017   IR PATIENT EVAL TECH 0-60 MINS  04/04/2017   IR PATIENT EVAL TECH 0-60 MINS  04/30/2017   IR PATIENT EVAL TECH 0-60 MINS  07/31/2017   IR REPLC DUODEN/JEJUNO TUBE  PERCUT W/FLUORO  11/14/2016   IRRIGATION AND  DEBRIDEMENT FOOT Bilateral 12/26/2020   Procedure: IRRIGATION AND DEBRIDEMENT FOOT AND BONE BIOPSY;  Surgeon: Trula Slade, DPM;  Location: [facility];  Service: Podiatry;  Laterality: Bilateral;   LAPAROTOMY N/A 01/20/2015   Procedure: EXPLORATORY LAPAROTOMY;  Surgeon: Coralie Keens, MD;  Location: [facility];  Service: General;  Laterality: N/A;   LOWER EXTREMITY ANGIOGRAPHY Left 09/21/2016   Procedure: Lower Extremity Angiography;  Surgeon: Waynetta Sandy, MD;  Location: [facility];  Service: Cardiovascular;  Laterality: Left;   LOWER EXTREMITY ANGIOGRAPHY Right 06/27/2017   Procedure: Lower Extremity Angiography;  Surgeon: Waynetta Sandy, MD;  Location: [facility];  Service: Cardiovascular;  Laterality: Right;   LOWER EXTREMITY ANGIOGRAPHY Right 12/24/2020   Procedure: LOWER EXTREMITY ANGIOGRAPHY;  Surgeon: Cherre Robins, MD;  Location: [facility];  Service: Cardiovascular;  Laterality: Right;   LYSIS OF ADHESION N/A 01/20/2015   Procedure: LYSIS OF ADHESIONS < 1 HOUR;  Surgeon: Coralie Keens, MD;  Location: [facility];  Service: General;  Laterality: N/A;   PERIPHERAL VASCULAR BALLOON ANGIOPLASTY Left 09/21/2016   Procedure: Peripheral Vascular Balloon Angioplasty;  Surgeon: Waynetta Sandy, MD;  Location: [facility];  Service: Cardiovascular;  Laterality: Left;  drug coated balloon   PERIPHERAL VASCULAR BALLOON ANGIOPLASTY Right 06/27/2017   Procedure: PERIPHERAL VASCULAR BALLOON ANGIOPLASTY;  Surgeon: Waynetta Sandy, MD;  Location: [facility];  Service: Cardiovascular;  Laterality: Right;  SFA/[id number]   PERIPHERAL VASCULAR INTERVENTION Left 10/20/2020   Procedure: PERIPHERAL VASCULAR INTERVENTION;  Surgeon: Cherre Robins, MD;  Location: [facility];  Service: Cardiovascular;  Laterality: Left;   TONSILLECTOMY     TRANSMETATARSAL AMPUTATION Right  11/14/2019   Procedure: TRANSMETATARSAL AMPUTATION RIGHT FOOT;  Surgeon: Edrick Kins, DPM;  Location: [facility];  Service: Podiatry;  Laterality: Right;   TUBAL LIGATION     VENTRAL HERNIA REPAIR  2015   incarcerated ventral hernia ([facility] 09/2013)/notes 01/10/2015   WOUND EXPLORATION Right 10/20/2020   Procedure: PRIMARY CLOSURE COMMON FEMORAL ARTERY;  Surgeon: Cherre Robins, MD;  Location: [facility];  Service: Vascular;  Laterality: Right;    Prior to Admission medications   Medication Sig Start Date End Date Taking? Authorizing Provider  acetaminophen (TYLENOL) 500 MG tablet Take 500 mg by mouth every 6 (six) hours as needed for moderate pain.    [provider]  amLODipine (NORVASC) 5 MG tablet TAKE 1 TABLET BY MOUTH EVERY DAY 10/25/20   [name], NP  aspirin 81 MG EC tablet Take 1 tablet (81 mg total) by mouth daily. Swallow whole. 10/20/20 10/20/21  Cherre Robins, MD  atorvastatin (LIPITOR) 10 MG tablet TAKE 1 TABLET BY MOUTH EVERY DAY 11/05/20   [name], NP  b complex vitamins tablet Take 1 tablet by mouth daily.     [provider]  cholecalciferol (VITAMIN D3) 25 MCG (1000 UNIT) tablet Take 1,000 Units by mouth daily.    [provider]  clopidogrel (PLAVIX) 75 MG tablet Take 1 tablet (75 mg total) by mouth daily. 01/19/21 01/19/22  [name], NP  cycloSPORINE (RESTASIS) 0.05 % ophthalmic emulsion USE 1 DROP INTO BOTH EYES TWICE DAILY 01/29/17   Gildardo Cranker, DO  DULoxetine (CYMBALTA) 30 MG capsule TAKE 1 CAPSULE BY MOUTH EVERY DAY 01/31/21   [name], NP  ferrous sulfate 220 (44 Fe) MG/5ML solution TAKE 7.4 MLS (325 MG TOTAL) BY MOUTH DAILY. 12/16/20   Sherrie Mustache  K, NP  fluticasone (FLONASE) 50 MCG/ACT nasal spray 2 sprays into each nostril daily. 12/09/20   [name], NP  furosemide (LASIX) 40 MG tablet TAKE 0.5-1 TABLETS (20-40 MG TOTAL) BY MOUTH SEE ADMIN INSTRUCTIONS. TAKE 20 MG DAILY, MAY INSTEAD TAKE 40 MG  DAILY AS NEEDED FOR EDEMA 08/04/20   Freada Bergeron, MD  gabapentin (NEURONTIN) 300 MG capsule TAKE 1 CAPSULE BY MOUTH TWICE A DAY 11/26/20   [name], NP  gentamicin cream (GARAMYCIN) 0.1 % Apply 1 application topically daily as needed (dry skin). 10/01/19   Edrick Kins, DPM  hydrALAZINE (APRESOLINE) 50 MG tablet Take 1 tablet (50 mg total) by mouth 3 (three) times daily. 01/24/21   Freada Bergeron, MD  Insulin Pen Needle (B-D UF III MINI PEN NEEDLES) 31G X 5 MM MISC Use twice daily with giving insulin injections. Dx E11.9 07/17/18   [name], NP  ipratropium-albuterol (DUONEB) 0.5-2.5 (3) MG/3ML SOLN Take 3 mLs by nebulization every 6 (six) hours as needed (shortness of breath).    [provider]  latanoprost (XALATAN) 0.005 % ophthalmic solution Place 1 drop into both eyes at bedtime.    [provider]  levalbuterol Penne Lash HFA) 45 MCG/ACT inhaler Inhale 2 puffs into the lungs every 6 (six) hours as needed for wheezing.    [provider]  levothyroxine (SYNTHROID) 125 MCG tablet TAKE 1 TABLET BY MOUTH EVERY DAY 01/31/21   [name], NP  loratadine (CLARITIN) 10 MG tablet Take 10 mg by mouth daily.    [provider]  mometasone-formoterol (DULERA) 200-5 MCG/ACT AERO INHALE TWO PUFFS BY MOUTH TWICE DAILY 10/05/20   [name], NP  Multiple Vitamin (MULTIVITAMIN WITH MINERALS) TABS tablet Take 1 tablet by mouth daily.    [provider]  Nutritional Supplements (FEEDING SUPPLEMENT, OSMOLITE 1.5 CAL,) LIQD Place 711 mLs into feeding tube See admin instructions. Take 711 ml over a 12 hour period per day 9 am-9 pm at a rate of 65    [provider]  nystatin cream (MYCOSTATIN) APPLY TO AFFECTED AREA TWICE A DAY 05/10/20   [name], NP  oxyCODONE (ROXICODONE) 5 MG immediate release tablet Take 0.5-1 tablets (2.5-5 mg total) by mouth every 8 (eight) hours as needed for severe pain. 10/05/20    [name], NP  pantoprazole (PROTONIX) 40 MG tablet Take 1 tablet (40 mg total) by mouth 2 (two) times daily. 10/15/20   [name], NP  Polyethyl Glycol-Propyl Glycol (SYSTANE) 0.4-0.3 % GEL ophthalmic gel Place 1 application into both eyes 2 (two) times daily as needed (moisture).    [provider]  Probiotic Product (EQL PROBIOTIC COLON SUPPORT PO) Take 1 capsule by mouth daily.    [provider]  senna-docusate (SENOKOT-S) 8.6-50 MG tablet Take 1 tablet by mouth at bedtime.    [provider]  shark liver oil-cocoa butter (PREPARATION H) 0.25-3-85.5 % suppository Place 1 suppository rectally as needed for hemorrhoids.    [provider]  traMADol (ULTRAM) 50 MG tablet Place 1 tablet (50 mg total) into feeding tube every 6 (six) hours as needed for severe pain. 10/22/20   Ulyses Amor, PA-C  Water For Irrigation, Sterile (FREE WATER) SOLN Place 180 mLs into feeding tube 3 (three) times daily.    [provider]  White Petrolatum-Mineral Oil (SYSTANE NIGHTTIME) OINT Place 1 application into both eyes at bedtime.     [provider]  Scheduled Meds:  [START ON 02/18/2021] pantoprazole  40 mg Intravenous Q12H   Infusions:  pantoprazole 8 mg/hr (02/15/21 0743)   PRN Meds: acetaminophen **OR** acetaminophen, ondansetron **OR** ondansetron (ZOFRAN) IV   Allergies as of 02/14/2021   (No Active Allergies)    Family History  Problem Relation Age of Onset   Stroke Mother    Hypertension Mother    Diabetes Brother    Glaucoma Son    Glaucoma Son    Heart attack Neg Hx    Colon cancer Neg Hx    Stomach cancer Neg Hx     Social History   Socioeconomic History   Marital status: Widowed    Spouse name: Not on file   Number of children: 3   Years of education: Not on file   Highest education level: Not on file  Occupational History   Not on file  Tobacco Use   Smoking status: Never   Smokeless tobacco:  Former    Types: Snuff    Quit date: 04/03/1980   Tobacco comments:    "used snuff in my younger days"  Vaping Use   Vaping Use: Never used  Substance and Sexual Activity   Alcohol use: Never    Alcohol/week: 0.0 standard drinks   Drug use: Never   Sexual activity: Not Currently  Other Topics Concern   Not on file  Social History Narrative   Married in 1947, lives in one story house with 2 people and no petsOccupation: ?Has a living Will, [place], and doesn't have DNRWas living with Husband (in frail health) in [place] until 09/2013.  After her [facility] admission they both came to live with daughter in [place] Determinants of Health   Financial Resource Strain: Not on file  Food Insecurity: Not on file  Transportation Needs: Not on file  Physical Activity: Not on file  Stress: Not on file  Social Connections: Not on file  Intimate Partner Violence: Not on file    REVIEW OF SYSTEMS: Constitutional: Weakness. ENT:  No nose bleeds Pulm: Denies shortness of breath and cough. CV:  No palpitations, no LE edema.  No angina. GU:  No hematuria, no frequency GI: See HPI. Heme: No reports of unusual or excessive bleeding or bruising. Transfusions: See HPI. Neuro:  No headaches, no peripheral tingling or numbness.  No dizziness, no tingling. Derm:  No itching, no rash or sores.  Endocrine:  No sweats or chills.  No polyuria or dysuria Immunization: Reviewed multiple vaccines.  Patient also reports she is received vaccination against COVID-19. Travel:  None beyond local counties in last few months.    PHYSICAL EXAM: Vital signs in last 24 hours: Vitals:   02/15/21 0630 02/15/21 0743  BP: (!) 143/57 (!) 145/61  Pulse: 73 79  Resp: 19 20  Temp:  98.3 F (36.8 C)  SpO2: 100% 100%   Wt Readings from Last 3 Encounters:  02/14/21 57.2 kg  01/25/21 56.9 kg  01/06/21 57.9 kg    General: Frail, aged but alert, comfortable.  Does not look toxic. Head: No facial  asymmetry or swelling.  No signs of head trauma. Eyes: Conjunctiva pink. Ears: Not hard of hearing Nose: No discharge or congestion Mouth: Edentulous.  Mucosa is dry but clear.  Tongue midline. Neck: No masses or thyromegaly Lungs: Nonlabored breathing.  No cough.  Lungs clear bilaterally. Heart: RRR.  No MRG. Abdomen: Soft.  Tender over the G tube site.  At the  PEG tube site there is discharge of greenish, foul-smelling liquid.  Slight erythema at G-tube site, this is not extensive.  No fluctuance.  Abdominal hernias.  Bowel sounds present..   Rectal: Deferred. Extremities: Toe amputations on the left with a dry ulcer at the hallux MP joint.  R transmetatarsal amputation, no associated erythema or signs of infection.  No edema.  Feet are warm. Neurologic: Alert.  Oriented to self, situation, year.  Answers questions slowly but appropriately.  Moves all 4 limbs, growth strength not tested.  No tremors. Skin: Left MP hallux ulcer as above. Psych: Calm, pleasant, cooperative.  Intake/Output from previous day: 11/14 0701 - 11/15 0700 In: 1095 [I.V.:150; Blood:945] Out: -  Intake/Output this shift: Total I/O In: 417.5 [I.V.:102.5; Blood:315] Out: -   LAB RESULTS: Recent Labs    02/14/21 1447 02/15/21 0345  WBC 9.2  --   HGB 5.3* 7.1*  HCT 17.2* 22.1*  PLT 300  --    BMET Lab Results  Component Value Date   NA 139 02/14/2021   NA 135 01/06/2021   NA 136 12/31/2020   K 5.0 02/14/2021   K 5.2 01/06/2021   K 4.6 12/31/2020   CL 109 02/14/2021   CL 100 01/06/2021   CL 102 12/31/2020   CO2 24 02/14/2021   CO2 25 01/06/2021   CO2 26 12/31/2020   GLUCOSE 137 (H) 02/14/2021   GLUCOSE 111 01/06/2021   GLUCOSE 160 (H) 12/31/2020   BUN 91 (H) 02/14/2021   BUN 59 (H) 01/06/2021   BUN 38 (H) 12/31/2020   CREATININE 1.80 (H) 02/14/2021   CREATININE 1.36 (H) 01/06/2021   CREATININE 1.09 (H) 12/31/2020   CALCIUM 9.1 02/14/2021   CALCIUM 9.3 01/06/2021   CALCIUM 9.5 12/31/2020    LFT Recent Labs    02/14/21 1447  PROT 7.0  ALBUMIN 3.0*  AST 22  ALT 17  ALKPHOS 76  BILITOT 0.3   PT/INR Lab Results  Component Value Date   INR 1.2 02/14/2021   INR 1.0 10/15/2020   INR 1.2 10/13/2020   Hepatitis Panel No results for input(s): HEPBSAG, HCVAB, HEPAIGM, HEPBIGM in the last 72 hours. C-Diff No components found for: CDIFF Lipase     Component Value Date/Time   LIPASE 25 04/22/2020 1232    Drugs of Abuse  No results found for: LABOPIA, COCAINSCRNUR, LABBENZ, AMPHETMU, THCU, LABBARB   RADIOLOGY STUDIES: CT ABDOMEN PELVIS WO CONTRAST  Result Date: 02/14/2021 IMPRESSION: 1. No acute findings. 2. Gastrojejunostomy tube in the stomach with balloon appropriately positioned and opposed to the anterior abdominal wall. Tubing is looped in the stomach, tip of the tube is in the 3/4 portion of the duodenum. No abnormality along the subcutaneous portion of the gastrostomy tube. 3. Ventral abdominal wall hernias. Right paramedial hernia contains nonobstructed noninflamed transverse colon, and is unchanged from prior imaging. 4. Ovoid soft tissue density anterior to the left hip in the subcutaneous tissues measuring 2.7 x 1.5 cm, stable over the course of 10 months, but overall increased in size from 2020 exam. Etiology and significance is uncertain, however of doubtful clinical significance in a patient of this age. 5. Colonic diverticulosis without diverticulitis. Aortic Atherosclerosis (ICD10-I70.0). Electronically Signed   By: Keith Rake M.D.   On: 02/14/2021 19:54   CT Head Wo Contrast  Result Date: 02/14/2021 CLINICAL DATA:  Head trauma, minor (Age >= 65y) EXAM: CT HEAD WITHOUT CONTRAST TECHNIQUE: Contiguous axial images were obtained from the base of the skull through  the vertex without intravenous contrast. COMPARISON:  12/28/2020 FINDINGS: Brain: There is no acute intracranial hemorrhage, mass effect, or edema. No new loss of gray-white differentiation.  There is no extra-axial fluid collection. Prominence of the ventricles and sulci reflects stable parenchymal volume loss. Patchy hypoattenuation in the supratentorial white matter likely reflects stable chronic microvascular ischemic changes. 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 Vascular: There is atherosclerotic calcification at the skull base. Skull: Calvarium is unremarkable. Sinuses/Orbits: No acute finding. Other: None. IMPRESSION: No evidence of acute intracranial injury. Electronically Signed   By: Macy Mis M.D.   On: 02/14/2021 16:56   DG FEMUR PORT, MIN 2 VIEWS RIGHT  Result Date: 02/14/2021 CLINICAL DATA:  Pain. EXAM: RIGHT FEMUR PORTABLE 2 VIEW COMPARISON:  None. FINDINGS: The bones are under mineralized. Cortical margins of the femur are intact. There is no evidence of fracture or other focal bone lesions. Hip and knee alignment are maintained. There is no periosteal reaction or bony destruction. Vascular calcifications. Soft tissues are otherwise unremarkable. IMPRESSION: No acute osseous abnormality or explanation for pain. Electronically Signed   By: Keith Rake M.D.   On: 02/14/2021 18:42   VAS [place] LOWER EXTREMITY VENOUS (DVT) (7a-7p)  Result Date: 02/14/2021 Summary: RIGHT: - There is no evidence of deep vein thrombosis in the lower extremity.  - No cystic structure found in the popliteal fossa.  LEFT: - No evidence of common femoral vein obstruction.   Electronically signed by Monica Martinez MD on 02/14/2021 at 4:45:32 PM.    Final       IMPRESSION:   Recurrent GI bleed.  At latest EGD of 10/2020 had moderately severe esophagitis, nonobstructing esophageal stenosis, gastric polyps.  Continues on Protonix bid but not sucralfate.  Macrocytic anemia.  Improved following 2 PRBCs.  Med list includes chronic daily iron, B complex vitamin  Feeding GJ tube, chronic pyloric stenosis.  Has greenish, foul-smelling discharge from the G-tube site    Chronic  vascular foot ulcers requiring toe and right transmetatarsal amputations. Chronic Plavix/ASA for severe lower extremity peripheral vascular disease requiring multiple toe then transmetatarsal amputations.  Angioplasty 10/2020.  Wound debridement, bone biopsy 12/26/2020.  Last doses of Plavix and aspirin were on 11/13. Recently completed course of antibiotics for osteomyelitis.  Hx DVT, PAF.  Previous Eliquis, Coumadin both discontinued due to GI bleeding.    AKI.  Known CKD.   PLAN:     No plans for repeat EGD.   Adding back Carafate qid, continue Protonix po or via tube 40 mg bid.  Strongly consider stopping Plavix, continuing low dose ASA.     Azucena Freed  02/15/2021, 8:15 AM Phone [phone]  ________________________________________________________________________  Velora Heckler GI MD note:  I personally examined the patient, reviewed the data and agree with the assessment and plan described above.  She had severe esophagitis on EGD just 4 months ago.  I am not planning to repeat invasive testing for now, she is quite elderly and very frail with multiple serious comorbid conditions.  Need to seriously consider not resuming plavix.  We will follow along.  Owens Loffler, MD [facility] Pager [phone]

## 2021-02-16 ENCOUNTER — Other Ambulatory Visit: Payer: Self-pay | Admitting: *Deleted

## 2021-02-16 DIAGNOSIS — K922 Gastrointestinal hemorrhage, unspecified: Secondary | ICD-10-CM

## 2021-02-16 LAB — BASIC METABOLIC PANEL
Anion gap: 9 (ref 5–15)
BUN: 52 mg/dL — ABNORMAL HIGH (ref 8–23)
CO2: 20 mmol/L — ABNORMAL LOW (ref 22–32)
Calcium: 8.9 mg/dL (ref 8.9–10.3)
Chloride: 110 mmol/L (ref 98–111)
Creatinine, Ser: 1.31 mg/dL — ABNORMAL HIGH (ref 0.44–1.00)
GFR, Estimated: 38 mL/min — ABNORMAL LOW (ref >60)
Glucose, Bld: 163 mg/dL — ABNORMAL HIGH (ref 70–99)
Potassium: 4.2 mmol/L (ref 3.5–5.1)
Sodium: 139 mmol/L (ref 135–145)

## 2021-02-16 LAB — CBC
HCT: 28.7 % — ABNORMAL LOW (ref 36.0–46.0)
Hemoglobin: 9.4 g/dL — ABNORMAL LOW (ref 12.0–15.0)
MCH: 30.9 pg (ref 26.0–34.0)
MCHC: 32.8 g/dL (ref 30.0–36.0)
MCV: 94.4 fL (ref 80.0–100.0)
Platelets: 248 10*3/uL (ref 150–400)
RBC: 3.04 MIL/uL — ABNORMAL LOW (ref 3.87–5.11)
RDW: 18.6 % — ABNORMAL HIGH (ref 11.5–15.5)
WBC: 7.5 10*3/uL (ref 4.0–10.5)
nRBC: 0.4 % — ABNORMAL HIGH (ref 0.0–0.2)

## 2021-02-16 MED ORDER — ZINC OXIDE 40 % EX OINT: TOPICAL_OINTMENT | Freq: Two times a day (BID) | CUTANEOUS | 0 refills | Status: DC

## 2021-02-16 MED ORDER — SUCRALFATE 1 GM/10ML PO SUSP: 1.0000 g | Freq: Four times a day (QID) | ORAL | 0 refills | Status: DC

## 2021-02-16 MED ORDER — SENNOSIDES-DOCUSATE SODIUM 8.6-50 MG PO TABS
1.0000 | ORAL_TABLET | Freq: Two times a day (BID) | ORAL | Status: DC
  Administered 2021-02-16: 1 via ORAL
  Filled 2021-02-16: qty 1

## 2021-02-16 MED ORDER — ATORVASTATIN CALCIUM 20 MG PO TABS
10.0000 mg | ORAL_TABLET | Freq: Every day | ORAL | 3 refills | Status: DC
Start: 2021-02-17 — End: 2021-07-06

## 2021-02-16 MED ORDER — ZINC OXIDE 40 % EX OINT
TOPICAL_OINTMENT | Freq: Two times a day (BID) | CUTANEOUS | Status: DC
  Filled 2021-02-16: qty 57

## 2021-02-16 NOTE — TOC Initial Note (Signed)
Transition of Care [facility]) - Initial/Assessment Note    Patient Details  Name: Shannon [name] MRN: [id number] Date of Birth: 1923/11/10  [facility]) CM/SW Contact:    Harriet Masson, RN Phone Number: 02/16/2021, 2:36 PM  Clinical Narrative:         Spoke to patient about transition needs.  Patient lives with her daughter who cares for her.  TOC will continue to follow.    Expected Discharge Plan: Home/Self Care (daughter takes care of her at home) Barriers to Discharge: Continued Medical Work up   Patient Goals and CMS Choice Patient states their goals for this hospitalization and ongoing recovery are:: return home with daughter      Expected Discharge Plan and Services Expected Discharge Plan: Home/Self Care (daughter takes care of her at home)       Living arrangements for the past 2 months: Single Family Home                   DME Agency: NA                  Prior Living Arrangements/Services Living arrangements for the past 2 months: Single Family Home Lives with:: Adult Children Patient language and need for interpreter reviewed:: Yes Do you feel safe going back to the place where you live?: Yes      Need for Family Participation in Patient Care: Yes (Comment) Care giver support system in place?: Yes (comment)   Criminal Activity/Legal Involvement Pertinent to Current Situation/Hospitalization: No - Comment as needed  Activities of Daily Living      Permission Sought/Granted                  Emotional Assessment Appearance:: Appears stated age Attitude/Demeanor/Rapport: Engaged Affect (typically observed): Accepting Orientation: : Oriented to Self, Oriented to Place, Oriented to  Time, Oriented to Situation Alcohol / Substance Use: Not Applicable Psych Involvement: No (comment)  Admission diagnosis:  Pain [R52] General weakness [R53.1] Fall, initial encounter [W19.XXXA] Symptomatic anemia [D64.9] Acute renal failure,  unspecified acute renal failure type (HCC) [N17.9] Abdominal pain, acute, left upper quadrant [R10.12] ABLA (acute blood loss anemia) [D62] Patient Active Problem List   Diagnosis Date Noted   Aortic atherosclerosis (HCC) 01/06/2021   Osteomyelitis of right foot (HCC) 12/21/2020   Medication monitoring encounter 12/21/2020   Diabetic infection of right foot (HCC) 12/21/2020   Diabetic foot infection (HCC) 12/21/2020   Osteomyelitis of left foot (HCC) 12/21/2020   UGI bleed 10/11/2020   Depression, major, single episode, complete remission (HCC) 06/09/2020   History of transmetatarsal amputation of right foot (HCC) 11/14/2019   Chest pain 08/27/2019   Lab test positive for detection of COVID-19 virus 08/27/2019   AMS (altered mental status) 07/07/2019   Varicose veins of right lower extremity with ulcer other part of foot (HCC) 06/11/2019   Jejunostomy tube fell out 03/02/2019   S/P amputation of lesser toe, left (HCC) 02/13/2019   BRBPR (bright red blood per rectum) 01/10/2019   GI bleed 12/25/2018   Bradycardia 11/25/2018   Amputated great toe of right foot (HCC) 05/06/2018   Peripheral vascular disease (HCC) 05/03/2018   Localized, primary osteoarthritis of shoulder region 01/21/2018   PAD (peripheral artery disease) (HCC) 06/27/2017   AKI (acute kidney injury) (HCC) 02/20/2017   Complication of feeding tube (HCC) 02/20/2017   PEG tube malfunction (HCC)    COPD with asthma (HCC) 10/03/2016   Chronic deep vein thrombosis (DVT) of  tibial vein of left lower extremity (HCC) 10/03/2016   Gangrene of toe of left foot (HCC) 09/15/2016   Recurrent aspiration pneumonia (HCC)    Atherosclerosis of native arteries of the extremities with gangrene (HCC) 07/28/2016   Other specified hypothyroidism    CHF (NYHA class IV, ACC/AHA stage D) (HCC)    Shortness of breath    Pressure injury of skin 03/11/2016   PEG (percutaneous endoscopic gastrostomy) status (HCC) 03/10/2016   Encounter for  therapeutic drug monitoring 02/16/2016   Warfarin anticoagulation    Chronic seasonal allergic rhinitis    Atrial fibrillation and flutter (HCC) 01/24/2016   Chronic diastolic CHF (congestive heart failure) (HCC) 01/24/2016   Bilateral leg edema 12/29/2015   Hypothyroidism due to medication    Weakness    Pyloric stenosis    Pressure ulcer 09/15/2015   PVD (peripheral vascular disease) (HCC)    Diabetic ulcer of toe of right foot associated with type 2 diabetes mellitus, with necrosis of muscle (HCC)    Diverticulitis of large intestine without perforation or abscess without bleeding    Colitis 08/26/2015   Esophageal candidiasis (HCC)    SOB (shortness of breath)    Encounter for long-term (current) use of other high-risk medications    Dysphagia 07/05/2015   Asthma with allergic rhinitis 06/04/2015   Hypertensive heart disease 03/18/2015   Malnutrition of moderate degree 01/28/2015   Syncope 01/27/2015   Symptomatic bradycardia 01/21/2015   Hypotension due to drugs 01/21/2015   Protein-calorie malnutrition, severe 01/12/2015   Hypertension associated with diabetes (HCC) 01/12/2015   Cerebrovascular disease 07/14/2014   Diabetic polyneuropathy associated with type 2 diabetes mellitus (HCC) 07/14/2014   Hyperlipidemia 07/14/2014   Gastric outlet obstruction 05/21/2014   Unstable gait 04/29/2014   Slurred speech    Gastroparesis    Constipation 02/03/2014   Carotid stenosis 02/03/2014   Demand ischemia (HCC) 01/21/2014   Gastric bezoar 01/20/2014   Anemia of chronic disease 01/18/2014   CKD (chronic kidney disease) stage 3, GFR 30-59 ml/min (HCC) 01/18/2014   Depression 09/29/2013   ABLA (acute blood loss anemia) 09/29/2013   Essential hypertension, benign 09/22/2013   GERD (gastroesophageal reflux disease) 09/22/2013   Hyperlipidemia associated with type 2 diabetes mellitus (HCC) 09/22/2013   Type 2 diabetes mellitus with chronic kidney disease (HCC) 09/22/2013    Persistent atrial fibrillation 09/22/2013   History of completed stroke 09/22/2013   Diabetic neuropathy (HCC) 09/22/2013   Hypothyroidism 09/22/2013   Chronic gastrojejunal ulcer with perforation (HCC) 09/07/2013   Accelerated hypertension 09/07/2013   Other specified abnormal immunological findings in serum 09/07/2013   Red blood cell antibody positive 09/07/2013   PCP:  Sharon Seller, NP Pharmacy:   [facility] [phone] - [address], [place] - [address] [place] [address] Phone: [phone] Fax: [phone]  [facility] - [place], [place] - [address] [place] [address] Phone: [phone] Fax: [phone]     Social Determinants of Health (SDOH) Interventions    Readmission Risk Interventions Readmission Risk Prevention Plan 10/22/2020  Transportation Screening Complete  Medication Review (RN Care Manager) Complete  HRI or Home Care Consult Complete  SW Recovery Care/Counseling Consult Complete  Palliative Care Screening Not Applicable  Skilled Nursing Facility Not Applicable  Some recent data might be hidden

## 2021-02-16 NOTE — Consult Note (Addendum)
19:54   CT Head Wo Contrast  Result Date: 02/14/2021 CLINICAL DATA:  Head trauma, minor (Age >= 65y) EXAM: CT HEAD WITHOUT CONTRAST TECHNIQUE: Contiguous axial images were obtained from the base of the skull through the vertex without intravenous contrast. COMPARISON:  12/28/2020 FINDINGS: Brain: There is no acute intracranial hemorrhage, mass effect, or edema. No new loss of gray-white differentiation. There is no extra-axial fluid collection. Prominence of the ventricles and sulci reflects stable parenchymal volume loss. Patchy hypoattenuation in the supratentorial white matter likely reflects stable chronic microvascular ischemic changes. 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 Vascular: There is atherosclerotic calcification at the skull base. Skull: Calvarium is unremarkable.  Sinuses/Orbits: No acute finding. Other: None. IMPRESSION: No evidence of acute intracranial injury. Electronically Signed   By: Macy Mis M.D.   On: 02/14/2021 16:56   VAS [place] ABI WITH/WO TBI  Result Date: 01/25/2021  LOWER EXTREMITY DOPPLER STUDY Patient Name:  Shannon [name]  Date of Exam:   01/25/2021 Medical Rec #: [id number]      Accession #:    [id number] Date of Birth: [birth year]      Patient Gender: F Patient Age:   27 years Exam Location:  [facility] Procedure:      VAS [place] ABI WITH/WO TBI Referring Phys: [employer] --------------------------------------------------------------------------------  Indications: Ulceration, peripheral artery disease, and Recent angiogram. High Risk Factors: Hypertension, hyperlipidemia, no history of smoking. Other Factors: PAD.  Vascular Interventions: 10/20/2020 Left popliteal artery and primary closure of                         right common femoral artery                         05/03/2018 Right great toe amputation.                         06/27/2017: Drug-coated balloon angioplasty right SFA and                         popliteal arteries with 5 mm balloon.                         3 mm plain balloon angioplasty of tibioperoneal trunk on                         right. Comparison Study: Recent angiogram 12/24/2020 showed on the left                   Anterior tibial artery: occluded                   Peroneal artery: widely patent without flow limiting stenosis                   Posterior tibial artery: occluded Performing Technologist: Delorise Shiner RVT  Examination Guidelines: A complete evaluation includes at minimum, Doppler waveform signals and systolic blood pressure reading at the level of bilateral brachial, anterior tibial, and posterior tibial arteries, when vessel segments are accessible. Bilateral testing is considered an integral part of a complete examination. Photoelectric Plethysmograph (PPG) waveforms and toe systolic pressure  readings are included as required and additional duplex testing as needed. Limited examinations for reoccurring indications may be performed as noted.  ABI Findings: +---------+------------------+-----+----------+--------------------------+ Right  19:54   CT Head Wo Contrast  Result Date: 02/14/2021 CLINICAL DATA:  Head trauma, minor (Age >= 65y) EXAM: CT HEAD WITHOUT CONTRAST TECHNIQUE: Contiguous axial images were obtained from the base of the skull through the vertex without intravenous contrast. COMPARISON:  12/28/2020 FINDINGS: Brain: There is no acute intracranial hemorrhage, mass effect, or edema. No new loss of gray-white differentiation. There is no extra-axial fluid collection. Prominence of the ventricles and sulci reflects stable parenchymal volume loss. Patchy hypoattenuation in the supratentorial white matter likely reflects stable chronic microvascular ischemic changes. 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 Vascular: There is atherosclerotic calcification at the skull base. Skull: Calvarium is unremarkable.  Sinuses/Orbits: No acute finding. Other: None. IMPRESSION: No evidence of acute intracranial injury. Electronically Signed   By: Macy Mis M.D.   On: 02/14/2021 16:56   VAS [place] ABI WITH/WO TBI  Result Date: 01/25/2021  LOWER EXTREMITY DOPPLER STUDY Patient Name:  Shannon [name]  Date of Exam:   01/25/2021 Medical Rec #: [id number]      Accession #:    [id number] Date of Birth: [birth year]      Patient Gender: F Patient Age:   27 years Exam Location:  [facility] Procedure:      VAS [place] ABI WITH/WO TBI Referring Phys: [employer] --------------------------------------------------------------------------------  Indications: Ulceration, peripheral artery disease, and Recent angiogram. High Risk Factors: Hypertension, hyperlipidemia, no history of smoking. Other Factors: PAD.  Vascular Interventions: 10/20/2020 Left popliteal artery and primary closure of                         right common femoral artery                         05/03/2018 Right great toe amputation.                         06/27/2017: Drug-coated balloon angioplasty right SFA and                         popliteal arteries with 5 mm balloon.                         3 mm plain balloon angioplasty of tibioperoneal trunk on                         right. Comparison Study: Recent angiogram 12/24/2020 showed on the left                   Anterior tibial artery: occluded                   Peroneal artery: widely patent without flow limiting stenosis                   Posterior tibial artery: occluded Performing Technologist: Delorise Shiner RVT  Examination Guidelines: A complete evaluation includes at minimum, Doppler waveform signals and systolic blood pressure reading at the level of bilateral brachial, anterior tibial, and posterior tibial arteries, when vessel segments are accessible. Bilateral testing is considered an integral part of a complete examination. Photoelectric Plethysmograph (PPG) waveforms and toe systolic pressure  readings are included as required and additional duplex testing as needed. Limited examinations for reoccurring indications may be performed as noted.  ABI Findings: +---------+------------------+-----+----------+--------------------------+ Right  intact. There is no evidence of fracture or other focal bone lesions. Hip and knee alignment are maintained. There is no periosteal reaction or bony destruction. Vascular calcifications. Soft tissues are otherwise unremarkable. IMPRESSION: No acute osseous abnormality or explanation for pain. Electronically Signed   By: Keith Rake M.D.   On: 02/14/2021 18:42   VAS [place] LOWER EXTREMITY VENOUS (DVT) (7a-7p)  Result Date: 02/14/2021  Lower Venous DVT Study Patient Name:  Shannon [name]  Date of Exam:   02/14/2021 Medical Rec #: [id number]      Accession #:    [id number] Date of Birth: December 19, 1923      Patient Gender: F Patient Age:   71 years Exam Location:  [facility] Procedure:      VAS [place] LOWER EXTREMITY VENOUS (DVT) Referring Phys: Audrie Gallus ROEMHILDT --------------------------------------------------------------------------------  Indications: Edema, and Swelling.  Comparison Study: no prior Performing Technologist: Archie Patten RVS  Examination  Guidelines: A complete evaluation includes B-mode imaging, spectral Doppler, color Doppler, and power Doppler as needed of all accessible portions of each vessel. Bilateral testing is considered an integral part of a complete examination. Limited examinations for reoccurring indications may be performed as noted. The reflux portion of the exam is performed with the patient in reverse Trendelenburg.  +---------+---------------+---------+-----------+----------+---------------+ RIGHT    CompressibilityPhasicitySpontaneityPropertiesThrombus Aging  +---------+---------------+---------+-----------+----------+---------------+ CFV      Full           Yes      Yes                                  +---------+---------------+---------+-----------+----------+---------------+ SFJ      Full                                                         +---------+---------------+---------+-----------+----------+---------------+ FV Prox  Full                                                         +---------+---------------+---------+-----------+----------+---------------+ FV Mid   Full                                                         +---------+---------------+---------+-----------+----------+---------------+ FV DistalFull                                                         +---------+---------------+---------+-----------+----------+---------------+ PFV      Full                                                         +---------+---------------+---------+-----------+----------+---------------+ POP  19:54   CT Head Wo Contrast  Result Date: 02/14/2021 CLINICAL DATA:  Head trauma, minor (Age >= 65y) EXAM: CT HEAD WITHOUT CONTRAST TECHNIQUE: Contiguous axial images were obtained from the base of the skull through the vertex without intravenous contrast. COMPARISON:  12/28/2020 FINDINGS: Brain: There is no acute intracranial hemorrhage, mass effect, or edema. No new loss of gray-white differentiation. There is no extra-axial fluid collection. Prominence of the ventricles and sulci reflects stable parenchymal volume loss. Patchy hypoattenuation in the supratentorial white matter likely reflects stable chronic microvascular ischemic changes. 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 Vascular: There is atherosclerotic calcification at the skull base. Skull: Calvarium is unremarkable.  Sinuses/Orbits: No acute finding. Other: None. IMPRESSION: No evidence of acute intracranial injury. Electronically Signed   By: Macy Mis M.D.   On: 02/14/2021 16:56   VAS [place] ABI WITH/WO TBI  Result Date: 01/25/2021  LOWER EXTREMITY DOPPLER STUDY Patient Name:  Shannon [name]  Date of Exam:   01/25/2021 Medical Rec #: [id number]      Accession #:    [id number] Date of Birth: [birth year]      Patient Gender: F Patient Age:   27 years Exam Location:  [facility] Procedure:      VAS [place] ABI WITH/WO TBI Referring Phys: [employer] --------------------------------------------------------------------------------  Indications: Ulceration, peripheral artery disease, and Recent angiogram. High Risk Factors: Hypertension, hyperlipidemia, no history of smoking. Other Factors: PAD.  Vascular Interventions: 10/20/2020 Left popliteal artery and primary closure of                         right common femoral artery                         05/03/2018 Right great toe amputation.                         06/27/2017: Drug-coated balloon angioplasty right SFA and                         popliteal arteries with 5 mm balloon.                         3 mm plain balloon angioplasty of tibioperoneal trunk on                         right. Comparison Study: Recent angiogram 12/24/2020 showed on the left                   Anterior tibial artery: occluded                   Peroneal artery: widely patent without flow limiting stenosis                   Posterior tibial artery: occluded Performing Technologist: Delorise Shiner RVT  Examination Guidelines: A complete evaluation includes at minimum, Doppler waveform signals and systolic blood pressure reading at the level of bilateral brachial, anterior tibial, and posterior tibial arteries, when vessel segments are accessible. Bilateral testing is considered an integral part of a complete examination. Photoelectric Plethysmograph (PPG) waveforms and toe systolic pressure  readings are included as required and additional duplex testing as needed. Limited examinations for reoccurring indications may be performed as noted.  ABI Findings: +---------+------------------+-----+----------+--------------------------+ Right  intact. There is no evidence of fracture or other focal bone lesions. Hip and knee alignment are maintained. There is no periosteal reaction or bony destruction. Vascular calcifications. Soft tissues are otherwise unremarkable. IMPRESSION: No acute osseous abnormality or explanation for pain. Electronically Signed   By: Keith Rake M.D.   On: 02/14/2021 18:42   VAS [place] LOWER EXTREMITY VENOUS (DVT) (7a-7p)  Result Date: 02/14/2021  Lower Venous DVT Study Patient Name:  Shannon [name]  Date of Exam:   02/14/2021 Medical Rec #: [id number]      Accession #:    [id number] Date of Birth: December 19, 1923      Patient Gender: F Patient Age:   71 years Exam Location:  [facility] Procedure:      VAS [place] LOWER EXTREMITY VENOUS (DVT) Referring Phys: Audrie Gallus ROEMHILDT --------------------------------------------------------------------------------  Indications: Edema, and Swelling.  Comparison Study: no prior Performing Technologist: Archie Patten RVS  Examination  Guidelines: A complete evaluation includes B-mode imaging, spectral Doppler, color Doppler, and power Doppler as needed of all accessible portions of each vessel. Bilateral testing is considered an integral part of a complete examination. Limited examinations for reoccurring indications may be performed as noted. The reflux portion of the exam is performed with the patient in reverse Trendelenburg.  +---------+---------------+---------+-----------+----------+---------------+ RIGHT    CompressibilityPhasicitySpontaneityPropertiesThrombus Aging  +---------+---------------+---------+-----------+----------+---------------+ CFV      Full           Yes      Yes                                  +---------+---------------+---------+-----------+----------+---------------+ SFJ      Full                                                         +---------+---------------+---------+-----------+----------+---------------+ FV Prox  Full                                                         +---------+---------------+---------+-----------+----------+---------------+ FV Mid   Full                                                         +---------+---------------+---------+-----------+----------+---------------+ FV DistalFull                                                         +---------+---------------+---------+-----------+----------+---------------+ PFV      Full                                                         +---------+---------------+---------+-----------+----------+---------------+ POP  intact. There is no evidence of fracture or other focal bone lesions. Hip and knee alignment are maintained. There is no periosteal reaction or bony destruction. Vascular calcifications. Soft tissues are otherwise unremarkable. IMPRESSION: No acute osseous abnormality or explanation for pain. Electronically Signed   By: Keith Rake M.D.   On: 02/14/2021 18:42   VAS [place] LOWER EXTREMITY VENOUS (DVT) (7a-7p)  Result Date: 02/14/2021  Lower Venous DVT Study Patient Name:  Shannon [name]  Date of Exam:   02/14/2021 Medical Rec #: [id number]      Accession #:    [id number] Date of Birth: December 19, 1923      Patient Gender: F Patient Age:   71 years Exam Location:  [facility] Procedure:      VAS [place] LOWER EXTREMITY VENOUS (DVT) Referring Phys: Audrie Gallus ROEMHILDT --------------------------------------------------------------------------------  Indications: Edema, and Swelling.  Comparison Study: no prior Performing Technologist: Archie Patten RVS  Examination  Guidelines: A complete evaluation includes B-mode imaging, spectral Doppler, color Doppler, and power Doppler as needed of all accessible portions of each vessel. Bilateral testing is considered an integral part of a complete examination. Limited examinations for reoccurring indications may be performed as noted. The reflux portion of the exam is performed with the patient in reverse Trendelenburg.  +---------+---------------+---------+-----------+----------+---------------+ RIGHT    CompressibilityPhasicitySpontaneityPropertiesThrombus Aging  +---------+---------------+---------+-----------+----------+---------------+ CFV      Full           Yes      Yes                                  +---------+---------------+---------+-----------+----------+---------------+ SFJ      Full                                                         +---------+---------------+---------+-----------+----------+---------------+ FV Prox  Full                                                         +---------+---------------+---------+-----------+----------+---------------+ FV Mid   Full                                                         +---------+---------------+---------+-----------+----------+---------------+ FV DistalFull                                                         +---------+---------------+---------+-----------+----------+---------------+ PFV      Full                                                         +---------+---------------+---------+-----------+----------+---------------+ POP  intact. There is no evidence of fracture or other focal bone lesions. Hip and knee alignment are maintained. There is no periosteal reaction or bony destruction. Vascular calcifications. Soft tissues are otherwise unremarkable. IMPRESSION: No acute osseous abnormality or explanation for pain. Electronically Signed   By: Keith Rake M.D.   On: 02/14/2021 18:42   VAS [place] LOWER EXTREMITY VENOUS (DVT) (7a-7p)  Result Date: 02/14/2021  Lower Venous DVT Study Patient Name:  Shannon [name]  Date of Exam:   02/14/2021 Medical Rec #: [id number]      Accession #:    [id number] Date of Birth: December 19, 1923      Patient Gender: F Patient Age:   71 years Exam Location:  [facility] Procedure:      VAS [place] LOWER EXTREMITY VENOUS (DVT) Referring Phys: Audrie Gallus ROEMHILDT --------------------------------------------------------------------------------  Indications: Edema, and Swelling.  Comparison Study: no prior Performing Technologist: Archie Patten RVS  Examination  Guidelines: A complete evaluation includes B-mode imaging, spectral Doppler, color Doppler, and power Doppler as needed of all accessible portions of each vessel. Bilateral testing is considered an integral part of a complete examination. Limited examinations for reoccurring indications may be performed as noted. The reflux portion of the exam is performed with the patient in reverse Trendelenburg.  +---------+---------------+---------+-----------+----------+---------------+ RIGHT    CompressibilityPhasicitySpontaneityPropertiesThrombus Aging  +---------+---------------+---------+-----------+----------+---------------+ CFV      Full           Yes      Yes                                  +---------+---------------+---------+-----------+----------+---------------+ SFJ      Full                                                         +---------+---------------+---------+-----------+----------+---------------+ FV Prox  Full                                                         +---------+---------------+---------+-----------+----------+---------------+ FV Mid   Full                                                         +---------+---------------+---------+-----------+----------+---------------+ FV DistalFull                                                         +---------+---------------+---------+-----------+----------+---------------+ PFV      Full                                                         +---------+---------------+---------+-----------+----------+---------------+ POP  19:54   CT Head Wo Contrast  Result Date: 02/14/2021 CLINICAL DATA:  Head trauma, minor (Age >= 65y) EXAM: CT HEAD WITHOUT CONTRAST TECHNIQUE: Contiguous axial images were obtained from the base of the skull through the vertex without intravenous contrast. COMPARISON:  12/28/2020 FINDINGS: Brain: There is no acute intracranial hemorrhage, mass effect, or edema. No new loss of gray-white differentiation. There is no extra-axial fluid collection. Prominence of the ventricles and sulci reflects stable parenchymal volume loss. Patchy hypoattenuation in the supratentorial white matter likely reflects stable chronic microvascular ischemic changes. 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 Vascular: There is atherosclerotic calcification at the skull base. Skull: Calvarium is unremarkable.  Sinuses/Orbits: No acute finding. Other: None. IMPRESSION: No evidence of acute intracranial injury. Electronically Signed   By: Macy Mis M.D.   On: 02/14/2021 16:56   VAS [place] ABI WITH/WO TBI  Result Date: 01/25/2021  LOWER EXTREMITY DOPPLER STUDY Patient Name:  Shannon [name]  Date of Exam:   01/25/2021 Medical Rec #: [id number]      Accession #:    [id number] Date of Birth: [birth year]      Patient Gender: F Patient Age:   27 years Exam Location:  [facility] Procedure:      VAS [place] ABI WITH/WO TBI Referring Phys: [employer] --------------------------------------------------------------------------------  Indications: Ulceration, peripheral artery disease, and Recent angiogram. High Risk Factors: Hypertension, hyperlipidemia, no history of smoking. Other Factors: PAD.  Vascular Interventions: 10/20/2020 Left popliteal artery and primary closure of                         right common femoral artery                         05/03/2018 Right great toe amputation.                         06/27/2017: Drug-coated balloon angioplasty right SFA and                         popliteal arteries with 5 mm balloon.                         3 mm plain balloon angioplasty of tibioperoneal trunk on                         right. Comparison Study: Recent angiogram 12/24/2020 showed on the left                   Anterior tibial artery: occluded                   Peroneal artery: widely patent without flow limiting stenosis                   Posterior tibial artery: occluded Performing Technologist: Delorise Shiner RVT  Examination Guidelines: A complete evaluation includes at minimum, Doppler waveform signals and systolic blood pressure reading at the level of bilateral brachial, anterior tibial, and posterior tibial arteries, when vessel segments are accessible. Bilateral testing is considered an integral part of a complete examination. Photoelectric Plethysmograph (PPG) waveforms and toe systolic pressure  readings are included as required and additional duplex testing as needed. Limited examinations for reoccurring indications may be performed as noted.  ABI Findings: +---------+------------------+-----+----------+--------------------------+ Right  19:54   CT Head Wo Contrast  Result Date: 02/14/2021 CLINICAL DATA:  Head trauma, minor (Age >= 65y) EXAM: CT HEAD WITHOUT CONTRAST TECHNIQUE: Contiguous axial images were obtained from the base of the skull through the vertex without intravenous contrast. COMPARISON:  12/28/2020 FINDINGS: Brain: There is no acute intracranial hemorrhage, mass effect, or edema. No new loss of gray-white differentiation. There is no extra-axial fluid collection. Prominence of the ventricles and sulci reflects stable parenchymal volume loss. Patchy hypoattenuation in the supratentorial white matter likely reflects stable chronic microvascular ischemic changes. 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 Vascular: There is atherosclerotic calcification at the skull base. Skull: Calvarium is unremarkable.  Sinuses/Orbits: No acute finding. Other: None. IMPRESSION: No evidence of acute intracranial injury. Electronically Signed   By: Macy Mis M.D.   On: 02/14/2021 16:56   VAS [place] ABI WITH/WO TBI  Result Date: 01/25/2021  LOWER EXTREMITY DOPPLER STUDY Patient Name:  Shannon [name]  Date of Exam:   01/25/2021 Medical Rec #: [id number]      Accession #:    [id number] Date of Birth: [birth year]      Patient Gender: F Patient Age:   27 years Exam Location:  [facility] Procedure:      VAS [place] ABI WITH/WO TBI Referring Phys: [employer] --------------------------------------------------------------------------------  Indications: Ulceration, peripheral artery disease, and Recent angiogram. High Risk Factors: Hypertension, hyperlipidemia, no history of smoking. Other Factors: PAD.  Vascular Interventions: 10/20/2020 Left popliteal artery and primary closure of                         right common femoral artery                         05/03/2018 Right great toe amputation.                         06/27/2017: Drug-coated balloon angioplasty right SFA and                         popliteal arteries with 5 mm balloon.                         3 mm plain balloon angioplasty of tibioperoneal trunk on                         right. Comparison Study: Recent angiogram 12/24/2020 showed on the left                   Anterior tibial artery: occluded                   Peroneal artery: widely patent without flow limiting stenosis                   Posterior tibial artery: occluded Performing Technologist: Delorise Shiner RVT  Examination Guidelines: A complete evaluation includes at minimum, Doppler waveform signals and systolic blood pressure reading at the level of bilateral brachial, anterior tibial, and posterior tibial arteries, when vessel segments are accessible. Bilateral testing is considered an integral part of a complete examination. Photoelectric Plethysmograph (PPG) waveforms and toe systolic pressure  readings are included as required and additional duplex testing as needed. Limited examinations for reoccurring indications may be performed as noted.  ABI Findings: +---------+------------------+-----+----------+--------------------------+ Right  19:54   CT Head Wo Contrast  Result Date: 02/14/2021 CLINICAL DATA:  Head trauma, minor (Age >= 65y) EXAM: CT HEAD WITHOUT CONTRAST TECHNIQUE: Contiguous axial images were obtained from the base of the skull through the vertex without intravenous contrast. COMPARISON:  12/28/2020 FINDINGS: Brain: There is no acute intracranial hemorrhage, mass effect, or edema. No new loss of gray-white differentiation. There is no extra-axial fluid collection. Prominence of the ventricles and sulci reflects stable parenchymal volume loss. Patchy hypoattenuation in the supratentorial white matter likely reflects stable chronic microvascular ischemic changes. 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 Vascular: There is atherosclerotic calcification at the skull base. Skull: Calvarium is unremarkable.  Sinuses/Orbits: No acute finding. Other: None. IMPRESSION: No evidence of acute intracranial injury. Electronically Signed   By: Macy Mis M.D.   On: 02/14/2021 16:56   VAS [place] ABI WITH/WO TBI  Result Date: 01/25/2021  LOWER EXTREMITY DOPPLER STUDY Patient Name:  Shannon [name]  Date of Exam:   01/25/2021 Medical Rec #: [id number]      Accession #:    [id number] Date of Birth: [birth year]      Patient Gender: F Patient Age:   27 years Exam Location:  [facility] Procedure:      VAS [place] ABI WITH/WO TBI Referring Phys: [employer]INS --------------------------------------------------------------------------------  Indications: Ulceration, peripheral artery disease, and Recent angiogram. High Risk Factors: Hypertension, hyperlipidemia, no history of smoking. Other Factors: PAD.  Vascular Interventions: 10/20/2020 Left popliteal artery and primary closure of                         right common femoral artery                         05/03/2018 Right great toe amputation.                         06/27/2017: Drug-coated balloon angioplasty right SFA and                         popliteal arteries with 5 mm balloon.                         3 mm plain balloon angioplasty of tibioperoneal trunk on                         right. Comparison Study: Recent angiogram 12/24/2020 showed on the left                   Anterior tibial artery: occluded                   Peroneal artery: widely patent without flow limiting stenosis                   Posterior tibial artery: occluded Performing Technologist: Delorise Shiner RVT  Examination Guidelines: A complete evaluation includes at minimum, Doppler waveform signals and systolic blood pressure reading at the level of bilateral brachial, anterior tibial, and posterior tibial arteries, when vessel segments are accessible. Bilateral testing is considered an integral part of a complete examination. Photoelectric Plethysmograph (PPG) waveforms and toe systolic pressure  readings are included as required and additional duplex testing as needed. Limited examinations for reoccurring indications may be performed as noted.  ABI Findings: +---------+------------------+-----+----------+--------------------------+ Right  19:54   CT Head Wo Contrast  Result Date: 02/14/2021 CLINICAL DATA:  Head trauma, minor (Age >= 65y) EXAM: CT HEAD WITHOUT CONTRAST TECHNIQUE: Contiguous axial images were obtained from the base of the skull through the vertex without intravenous contrast. COMPARISON:  12/28/2020 FINDINGS: Brain: There is no acute intracranial hemorrhage, mass effect, or edema. No new loss of gray-white differentiation. There is no extra-axial fluid collection. Prominence of the ventricles and sulci reflects stable parenchymal volume loss. Patchy hypoattenuation in the supratentorial white matter likely reflects stable chronic microvascular ischemic changes. 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 Vascular: There is atherosclerotic calcification at the skull base. Skull: Calvarium is unremarkable.  Sinuses/Orbits: No acute finding. Other: None. IMPRESSION: No evidence of acute intracranial injury. Electronically Signed   By: Macy Mis M.D.   On: 02/14/2021 16:56   VAS [place] ABI WITH/WO TBI  Result Date: 01/25/2021  LOWER EXTREMITY DOPPLER STUDY Patient Name:  Shannon [name]  Date of Exam:   01/25/2021 Medical Rec #: [id number]      Accession #:    [id number] Date of Birth: [birth year]      Patient Gender: F Patient Age:   27 years Exam Location:  [facility] Procedure:      VAS [place] ABI WITH/WO TBI Referring Phys: [employer] --------------------------------------------------------------------------------  Indications: Ulceration, peripheral artery disease, and Recent angiogram. High Risk Factors: Hypertension, hyperlipidemia, no history of smoking. Other Factors: PAD.  Vascular Interventions: 10/20/2020 Left popliteal artery and primary closure of                         right common femoral artery                         05/03/2018 Right great toe amputation.                         06/27/2017: Drug-coated balloon angioplasty right SFA and                         popliteal arteries with 5 mm balloon.                         3 mm plain balloon angioplasty of tibioperoneal trunk on                         right. Comparison Study: Recent angiogram 12/24/2020 showed on the left                   Anterior tibial artery: occluded                   Peroneal artery: widely patent without flow limiting stenosis                   Posterior tibial artery: occluded Performing Technologist: Delorise Shiner RVT  Examination Guidelines: A complete evaluation includes at minimum, Doppler waveform signals and systolic blood pressure reading at the level of bilateral brachial, anterior tibial, and posterior tibial arteries, when vessel segments are accessible. Bilateral testing is considered an integral part of a complete examination. Photoelectric Plethysmograph (PPG) waveforms and toe systolic pressure  readings are included as required and additional duplex testing as needed. Limited examinations for reoccurring indications may be performed as noted.  ABI Findings: +---------+------------------+-----+----------+--------------------------+ Right

## 2021-02-16 NOTE — Progress Notes (Signed)
PT Cancellation Note  Patient Details Name: [name] MRN: [id number] DOB: 1923/08/13   Cancelled Treatment:    Reason Eval/Treat Not Completed: Patient at procedure or test/unavailable  Patient just starting to eat her breakfast. Will return   Arby Barrette, [place]  Pager [phone] Office [phone]   Rexanne Mano 02/16/2021, 9:23 AM

## 2021-02-16 NOTE — Patient Outreach (Signed)
[facility]) Care Management  02/16/2021  GISSELL BARRA [birth year] [id number]   Noted member admitted to hospital on 11/14, hospital liaisons notified.  Will follow up with member once she is discharged back home.    [name], [place], MSN [place] [phone]

## 2021-02-16 NOTE — Discharge Summary (Signed)
tube in the stomach with balloon appropriately positioned and opposed to the anterior abdominal wall. Tubing is looped in the stomach, tip of the tube is in the 3/4 portion of the duodenum. There is no abnormality along the subcutaneous portion of the gastrostomy tube. Overall stomach is decompressed. Chain sutures about the anterior stomach. Median ventral abdominal wall hernia contains short segment of transverse colon. There is no associated wall thickening or obstruction. Multifocal colonic diverticulosis without diverticulitis. There is also mild distal ileal diverticulosis. No small bowel obstruction. Enteric anastomosis in the left abdomen again seen, with slightly patulous appearance of the anastomotic segment, but noninflamed. Vascular/Lymphatic: Aortic atherosclerosis without aneurysm. Advanced bi-iliac atherosclerosis. Surgical clips within the right groin likely at site of prior vascular access. There is no portal venous or mesenteric gas. No bulky abdominopelvic adenopathy. Reproductive: Normal for age uterine atrophy.  No adnexal mass. Other: No free air or ascites. Ventral abdominal wall hernia, right paramedian hernia contains a nonobstructed noninflamed transverse colon. Left paramidline hernia contains only fat. Anterior to the left hip in the subcutaneous tissues is a 2.7 x 1.5 cm ovoid soft tissue density. This was present on multiple prior exams, and  is stable over the course of 10 months, but overall increased in size from 2020 exam. Assessment margins and no surrounding inflammation. Musculoskeletal: The bones are diffusely under mineralized. No acute osseous findings. Minimal soft tissue thickening overlying the sacrum but no evidence of abscess no focal fluid collection. IMPRESSION: 1. No acute findings. 2. Gastrojejunostomy tube in the stomach with balloon appropriately positioned and opposed to the anterior abdominal wall. Tubing is looped in the stomach, tip of the tube is in the 3/4 portion of the duodenum. No abnormality along the subcutaneous portion of the gastrostomy tube. 3. Ventral abdominal wall hernias. Right paramedial hernia contains nonobstructed noninflamed transverse colon, and is unchanged from prior imaging. 4. Ovoid soft tissue density anterior to the left hip in the subcutaneous tissues measuring 2.7 x 1.5 cm, stable over the course of 10 months, but overall increased in size from 2020 exam. Etiology and significance is uncertain, however of doubtful clinical significance in a patient of this age. 5. Colonic diverticulosis without diverticulitis. Aortic Atherosclerosis (ICD10-I70.0). Electronically Signed   By: Keith Rake M.D.   On: 02/14/2021 19:54   CT Head Wo Contrast  Result Date: 02/14/2021 CLINICAL DATA:  Head trauma, minor (Age >= 65y) EXAM: CT HEAD WITHOUT CONTRAST TECHNIQUE: Contiguous axial images were obtained from the base of the skull through the vertex without intravenous contrast. COMPARISON:  12/28/2020 FINDINGS: Brain: There is no acute intracranial hemorrhage, mass effect, or edema. No new loss of gray-white differentiation. There is no extra-axial fluid collection. Prominence of the ventricles and sulci reflects stable parenchymal volume loss. Patchy hypoattenuation in the supratentorial white matter likely reflects stable chronic microvascular ischemic changes. 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 Vascular: There is atherosclerotic calcification at the skull base. Skull: Calvarium is unremarkable. Sinuses/Orbits: No acute finding. Other: None. IMPRESSION: No evidence of acute intracranial injury. Electronically Signed   By: Macy Mis M.D.   On: 02/14/2021 16:56   VAS [place] ABI WITH/WO TBI  Result Date: 01/25/2021  LOWER EXTREMITY DOPPLER STUDY Patient Name:  Shannon [name]  Date of Exam:   01/25/2021 Medical Rec #: [id number]      Accession #:    [id number] Date of Birth: December 11, 1923      Patient Gender: F Patient Age:   48 years Exam Location:  Transmetatarsal amputation +---------+------------------+-----+----------+--------------------------+ +---------+------------------+-----+--------+-------+ Left     Lt Pressure (mmHg)IndexWaveformComment +---------+------------------+-----+--------+-------+ Brachial 171                                    +---------+------------------+-----+--------+-------+ PTA      0                 0.00                 +---------+------------------+-----+--------+-------+ PERO     255               [place]   biphasic        +---------+------------------+-----+--------+-------+ DP       0                 0.00                 +---------+------------------+-----+--------+-------+ Great Toe65                0.38                 +---------+------------------+-----+--------+-------+ +-------+----------------+-----------+----------------+------------+ ABI/TBIToday's ABI     Today's TBIPrevious ABI    Previous TBI  +-------+----------------+-----------+----------------+------------+ Right  0.86            amputation 1.06            amputation   +-------+----------------+-----------+----------------+------------+ Left   Non compressible0.38       Non compressiblenot obtained +-------+----------------+-----------+----------------+------------+ Right ABIs appear decreased compared to prior study on 11/16/2020. Left ABIs appear essentially unchanged compared to prior study on 11/16/2020.  Summary: Right: Resting right ankle-brachial index indicates mild right lower extremity arterial disease. Left: Resting left ankle-brachial index indicates noncompressible left lower extremity arteries. The left toe-brachial index is abnormal.  *See table(s) above for measurements and observations.  Electronically signed by Monica Martinez MD on 01/25/2021 at 4:27:47 PM.    Final    DG FEMUR PORT, MIN 2 VIEWS RIGHT  Result Date: 02/14/2021 CLINICAL DATA:  Pain. EXAM: RIGHT FEMUR PORTABLE 2 VIEW COMPARISON:  None. FINDINGS: The bones are under mineralized. Cortical margins of the femur are intact. There is no evidence of fracture or other focal bone lesions. Hip and knee alignment are maintained. There is no periosteal reaction or bony destruction. Vascular calcifications. Soft tissues are otherwise unremarkable. IMPRESSION: No acute osseous abnormality or explanation for pain. Electronically Signed   By: Keith Rake M.D.   On: 02/14/2021 18:42   VAS [place] LOWER EXTREMITY VENOUS (DVT) (7a-7p)  Result Date: 02/14/2021  Lower Venous DVT Study Patient Name:  Shannon [name]  Date of Exam:   02/14/2021 Medical Rec #: [id number]      Accession #:    [id number] Date of Birth: 1923/09/27      Patient Gender: F Patient Age:   72 years Exam Location:  [facility] Procedure:      VAS [place] LOWER EXTREMITY VENOUS (DVT) Referring Phys: Audrie Gallus ROEMHILDT --------------------------------------------------------------------------------   Indications: Edema, and Swelling.  Comparison Study: no prior Performing Technologist: Archie Patten RVS  Examination Guidelines: A complete evaluation includes B-mode imaging, spectral Doppler, color Doppler, and power Doppler as needed of all accessible portions of each vessel. Bilateral testing is considered an integral part of a complete examination. Limited examinations for reoccurring indications may be performed as noted. The reflux portion of the exam is performed with the patient in reverse Trendelenburg.  +---------+---------------+---------+-----------+----------+---------------+  tube in the stomach with balloon appropriately positioned and opposed to the anterior abdominal wall. Tubing is looped in the stomach, tip of the tube is in the 3/4 portion of the duodenum. There is no abnormality along the subcutaneous portion of the gastrostomy tube. Overall stomach is decompressed. Chain sutures about the anterior stomach. Median ventral abdominal wall hernia contains short segment of transverse colon. There is no associated wall thickening or obstruction. Multifocal colonic diverticulosis without diverticulitis. There is also mild distal ileal diverticulosis. No small bowel obstruction. Enteric anastomosis in the left abdomen again seen, with slightly patulous appearance of the anastomotic segment, but noninflamed. Vascular/Lymphatic: Aortic atherosclerosis without aneurysm. Advanced bi-iliac atherosclerosis. Surgical clips within the right groin likely at site of prior vascular access. There is no portal venous or mesenteric gas. No bulky abdominopelvic adenopathy. Reproductive: Normal for age uterine atrophy.  No adnexal mass. Other: No free air or ascites. Ventral abdominal wall hernia, right paramedian hernia contains a nonobstructed noninflamed transverse colon. Left paramidline hernia contains only fat. Anterior to the left hip in the subcutaneous tissues is a 2.7 x 1.5 cm ovoid soft tissue density. This was present on multiple prior exams, and  is stable over the course of 10 months, but overall increased in size from 2020 exam. Assessment margins and no surrounding inflammation. Musculoskeletal: The bones are diffusely under mineralized. No acute osseous findings. Minimal soft tissue thickening overlying the sacrum but no evidence of abscess no focal fluid collection. IMPRESSION: 1. No acute findings. 2. Gastrojejunostomy tube in the stomach with balloon appropriately positioned and opposed to the anterior abdominal wall. Tubing is looped in the stomach, tip of the tube is in the 3/4 portion of the duodenum. No abnormality along the subcutaneous portion of the gastrostomy tube. 3. Ventral abdominal wall hernias. Right paramedial hernia contains nonobstructed noninflamed transverse colon, and is unchanged from prior imaging. 4. Ovoid soft tissue density anterior to the left hip in the subcutaneous tissues measuring 2.7 x 1.5 cm, stable over the course of 10 months, but overall increased in size from 2020 exam. Etiology and significance is uncertain, however of doubtful clinical significance in a patient of this age. 5. Colonic diverticulosis without diverticulitis. Aortic Atherosclerosis (ICD10-I70.0). Electronically Signed   By: Keith Rake M.D.   On: 02/14/2021 19:54   CT Head Wo Contrast  Result Date: 02/14/2021 CLINICAL DATA:  Head trauma, minor (Age >= 65y) EXAM: CT HEAD WITHOUT CONTRAST TECHNIQUE: Contiguous axial images were obtained from the base of the skull through the vertex without intravenous contrast. COMPARISON:  12/28/2020 FINDINGS: Brain: There is no acute intracranial hemorrhage, mass effect, or edema. No new loss of gray-white differentiation. There is no extra-axial fluid collection. Prominence of the ventricles and sulci reflects stable parenchymal volume loss. Patchy hypoattenuation in the supratentorial white matter likely reflects stable chronic microvascular ischemic changes. 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 Vascular: There is atherosclerotic calcification at the skull base. Skull: Calvarium is unremarkable. Sinuses/Orbits: No acute finding. Other: None. IMPRESSION: No evidence of acute intracranial injury. Electronically Signed   By: Macy Mis M.D.   On: 02/14/2021 16:56   VAS [place] ABI WITH/WO TBI  Result Date: 01/25/2021  LOWER EXTREMITY DOPPLER STUDY Patient Name:  Shannon [name]  Date of Exam:   01/25/2021 Medical Rec #: [id number]      Accession #:    [id number] Date of Birth: December 11, 1923      Patient Gender: F Patient Age:   48 years Exam Location:  Transmetatarsal amputation +---------+------------------+-----+----------+--------------------------+ +---------+------------------+-----+--------+-------+ Left     Lt Pressure (mmHg)IndexWaveformComment +---------+------------------+-----+--------+-------+ Brachial 171                                    +---------+------------------+-----+--------+-------+ PTA      0                 0.00                 +---------+------------------+-----+--------+-------+ PERO     255               [place]   biphasic        +---------+------------------+-----+--------+-------+ DP       0                 0.00                 +---------+------------------+-----+--------+-------+ Great Toe65                0.38                 +---------+------------------+-----+--------+-------+ +-------+----------------+-----------+----------------+------------+ ABI/TBIToday's ABI     Today's TBIPrevious ABI    Previous TBI  +-------+----------------+-----------+----------------+------------+ Right  0.86            amputation 1.06            amputation   +-------+----------------+-----------+----------------+------------+ Left   Non compressible0.38       Non compressiblenot obtained +-------+----------------+-----------+----------------+------------+ Right ABIs appear decreased compared to prior study on 11/16/2020. Left ABIs appear essentially unchanged compared to prior study on 11/16/2020.  Summary: Right: Resting right ankle-brachial index indicates mild right lower extremity arterial disease. Left: Resting left ankle-brachial index indicates noncompressible left lower extremity arteries. The left toe-brachial index is abnormal.  *See table(s) above for measurements and observations.  Electronically signed by Monica Martinez MD on 01/25/2021 at 4:27:47 PM.    Final    DG FEMUR PORT, MIN 2 VIEWS RIGHT  Result Date: 02/14/2021 CLINICAL DATA:  Pain. EXAM: RIGHT FEMUR PORTABLE 2 VIEW COMPARISON:  None. FINDINGS: The bones are under mineralized. Cortical margins of the femur are intact. There is no evidence of fracture or other focal bone lesions. Hip and knee alignment are maintained. There is no periosteal reaction or bony destruction. Vascular calcifications. Soft tissues are otherwise unremarkable. IMPRESSION: No acute osseous abnormality or explanation for pain. Electronically Signed   By: Keith Rake M.D.   On: 02/14/2021 18:42   VAS [place] LOWER EXTREMITY VENOUS (DVT) (7a-7p)  Result Date: 02/14/2021  Lower Venous DVT Study Patient Name:  Shannon [name]  Date of Exam:   02/14/2021 Medical Rec #: [id number]      Accession #:    [id number] Date of Birth: 1923/09/27      Patient Gender: F Patient Age:   72 years Exam Location:  [facility] Procedure:      VAS [place] LOWER EXTREMITY VENOUS (DVT) Referring Phys: Audrie Gallus ROEMHILDT --------------------------------------------------------------------------------   Indications: Edema, and Swelling.  Comparison Study: no prior Performing Technologist: Archie Patten RVS  Examination Guidelines: A complete evaluation includes B-mode imaging, spectral Doppler, color Doppler, and power Doppler as needed of all accessible portions of each vessel. Bilateral testing is considered an integral part of a complete examination. Limited examinations for reoccurring indications may be performed as noted. The reflux portion of the exam is performed with the patient in reverse Trendelenburg.  +---------+---------------+---------+-----------+----------+---------------+  Transmetatarsal amputation +---------+------------------+-----+----------+--------------------------+ +---------+------------------+-----+--------+-------+ Left     Lt Pressure (mmHg)IndexWaveformComment +---------+------------------+-----+--------+-------+ Brachial 171                                    +---------+------------------+-----+--------+-------+ PTA      0                 0.00                 +---------+------------------+-----+--------+-------+ PERO     255               [place]   biphasic        +---------+------------------+-----+--------+-------+ DP       0                 0.00                 +---------+------------------+-----+--------+-------+ Great Toe65                0.38                 +---------+------------------+-----+--------+-------+ +-------+----------------+-----------+----------------+------------+ ABI/TBIToday's ABI     Today's TBIPrevious ABI    Previous TBI  +-------+----------------+-----------+----------------+------------+ Right  0.86            amputation 1.06            amputation   +-------+----------------+-----------+----------------+------------+ Left   Non compressible0.38       Non compressiblenot obtained +-------+----------------+-----------+----------------+------------+ Right ABIs appear decreased compared to prior study on 11/16/2020. Left ABIs appear essentially unchanged compared to prior study on 11/16/2020.  Summary: Right: Resting right ankle-brachial index indicates mild right lower extremity arterial disease. Left: Resting left ankle-brachial index indicates noncompressible left lower extremity arteries. The left toe-brachial index is abnormal.  *See table(s) above for measurements and observations.  Electronically signed by Monica Martinez MD on 01/25/2021 at 4:27:47 PM.    Final    DG FEMUR PORT, MIN 2 VIEWS RIGHT  Result Date: 02/14/2021 CLINICAL DATA:  Pain. EXAM: RIGHT FEMUR PORTABLE 2 VIEW COMPARISON:  None. FINDINGS: The bones are under mineralized. Cortical margins of the femur are intact. There is no evidence of fracture or other focal bone lesions. Hip and knee alignment are maintained. There is no periosteal reaction or bony destruction. Vascular calcifications. Soft tissues are otherwise unremarkable. IMPRESSION: No acute osseous abnormality or explanation for pain. Electronically Signed   By: Keith Rake M.D.   On: 02/14/2021 18:42   VAS [place] LOWER EXTREMITY VENOUS (DVT) (7a-7p)  Result Date: 02/14/2021  Lower Venous DVT Study Patient Name:  Shannon [name]  Date of Exam:   02/14/2021 Medical Rec #: [id number]      Accession #:    [id number] Date of Birth: 1923/09/27      Patient Gender: F Patient Age:   72 years Exam Location:  [facility] Procedure:      VAS [place] LOWER EXTREMITY VENOUS (DVT) Referring Phys: Audrie Gallus ROEMHILDT --------------------------------------------------------------------------------   Indications: Edema, and Swelling.  Comparison Study: no prior Performing Technologist: Archie Patten RVS  Examination Guidelines: A complete evaluation includes B-mode imaging, spectral Doppler, color Doppler, and power Doppler as needed of all accessible portions of each vessel. Bilateral testing is considered an integral part of a complete examination. Limited examinations for reoccurring indications may be performed as noted. The reflux portion of the exam is performed with the patient in reverse Trendelenburg.  +---------+---------------+---------+-----------+----------+---------------+  tube in the stomach with balloon appropriately positioned and opposed to the anterior abdominal wall. Tubing is looped in the stomach, tip of the tube is in the 3/4 portion of the duodenum. There is no abnormality along the subcutaneous portion of the gastrostomy tube. Overall stomach is decompressed. Chain sutures about the anterior stomach. Median ventral abdominal wall hernia contains short segment of transverse colon. There is no associated wall thickening or obstruction. Multifocal colonic diverticulosis without diverticulitis. There is also mild distal ileal diverticulosis. No small bowel obstruction. Enteric anastomosis in the left abdomen again seen, with slightly patulous appearance of the anastomotic segment, but noninflamed. Vascular/Lymphatic: Aortic atherosclerosis without aneurysm. Advanced bi-iliac atherosclerosis. Surgical clips within the right groin likely at site of prior vascular access. There is no portal venous or mesenteric gas. No bulky abdominopelvic adenopathy. Reproductive: Normal for age uterine atrophy.  No adnexal mass. Other: No free air or ascites. Ventral abdominal wall hernia, right paramedian hernia contains a nonobstructed noninflamed transverse colon. Left paramidline hernia contains only fat. Anterior to the left hip in the subcutaneous tissues is a 2.7 x 1.5 cm ovoid soft tissue density. This was present on multiple prior exams, and  is stable over the course of 10 months, but overall increased in size from 2020 exam. Assessment margins and no surrounding inflammation. Musculoskeletal: The bones are diffusely under mineralized. No acute osseous findings. Minimal soft tissue thickening overlying the sacrum but no evidence of abscess no focal fluid collection. IMPRESSION: 1. No acute findings. 2. Gastrojejunostomy tube in the stomach with balloon appropriately positioned and opposed to the anterior abdominal wall. Tubing is looped in the stomach, tip of the tube is in the 3/4 portion of the duodenum. No abnormality along the subcutaneous portion of the gastrostomy tube. 3. Ventral abdominal wall hernias. Right paramedial hernia contains nonobstructed noninflamed transverse colon, and is unchanged from prior imaging. 4. Ovoid soft tissue density anterior to the left hip in the subcutaneous tissues measuring 2.7 x 1.5 cm, stable over the course of 10 months, but overall increased in size from 2020 exam. Etiology and significance is uncertain, however of doubtful clinical significance in a patient of this age. 5. Colonic diverticulosis without diverticulitis. Aortic Atherosclerosis (ICD10-I70.0). Electronically Signed   By: Keith Rake M.D.   On: 02/14/2021 19:54   CT Head Wo Contrast  Result Date: 02/14/2021 CLINICAL DATA:  Head trauma, minor (Age >= 65y) EXAM: CT HEAD WITHOUT CONTRAST TECHNIQUE: Contiguous axial images were obtained from the base of the skull through the vertex without intravenous contrast. COMPARISON:  12/28/2020 FINDINGS: Brain: There is no acute intracranial hemorrhage, mass effect, or edema. No new loss of gray-white differentiation. There is no extra-axial fluid collection. Prominence of the ventricles and sulci reflects stable parenchymal volume loss. Patchy hypoattenuation in the supratentorial white matter likely reflects stable chronic microvascular ischemic changes. 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 Vascular: There is atherosclerotic calcification at the skull base. Skull: Calvarium is unremarkable. Sinuses/Orbits: No acute finding. Other: None. IMPRESSION: No evidence of acute intracranial injury. Electronically Signed   By: Macy Mis M.D.   On: 02/14/2021 16:56   VAS [place] ABI WITH/WO TBI  Result Date: 01/25/2021  LOWER EXTREMITY DOPPLER STUDY Patient Name:  Shannon [name]  Date of Exam:   01/25/2021 Medical Rec #: [id number]      Accession #:    [id number] Date of Birth: December 11, 1923      Patient Gender: F Patient Age:   48 years Exam Location:  Transmetatarsal amputation +---------+------------------+-----+----------+--------------------------+ +---------+------------------+-----+--------+-------+ Left     Lt Pressure (mmHg)IndexWaveformComment +---------+------------------+-----+--------+-------+ Brachial 171                                    +---------+------------------+-----+--------+-------+ PTA      0                 0.00                 +---------+------------------+-----+--------+-------+ PERO     255               [place]   biphasic        +---------+------------------+-----+--------+-------+ DP       0                 0.00                 +---------+------------------+-----+--------+-------+ Great Toe65                0.38                 +---------+------------------+-----+--------+-------+ +-------+----------------+-----------+----------------+------------+ ABI/TBIToday's ABI     Today's TBIPrevious ABI    Previous TBI  +-------+----------------+-----------+----------------+------------+ Right  0.86            amputation 1.06            amputation   +-------+----------------+-----------+----------------+------------+ Left   Non compressible0.38       Non compressiblenot obtained +-------+----------------+-----------+----------------+------------+ Right ABIs appear decreased compared to prior study on 11/16/2020. Left ABIs appear essentially unchanged compared to prior study on 11/16/2020.  Summary: Right: Resting right ankle-brachial index indicates mild right lower extremity arterial disease. Left: Resting left ankle-brachial index indicates noncompressible left lower extremity arteries. The left toe-brachial index is abnormal.  *See table(s) above for measurements and observations.  Electronically signed by Monica Martinez MD on 01/25/2021 at 4:27:47 PM.    Final    DG FEMUR PORT, MIN 2 VIEWS RIGHT  Result Date: 02/14/2021 CLINICAL DATA:  Pain. EXAM: RIGHT FEMUR PORTABLE 2 VIEW COMPARISON:  None. FINDINGS: The bones are under mineralized. Cortical margins of the femur are intact. There is no evidence of fracture or other focal bone lesions. Hip and knee alignment are maintained. There is no periosteal reaction or bony destruction. Vascular calcifications. Soft tissues are otherwise unremarkable. IMPRESSION: No acute osseous abnormality or explanation for pain. Electronically Signed   By: Keith Rake M.D.   On: 02/14/2021 18:42   VAS [place] LOWER EXTREMITY VENOUS (DVT) (7a-7p)  Result Date: 02/14/2021  Lower Venous DVT Study Patient Name:  Shannon [name]  Date of Exam:   02/14/2021 Medical Rec #: [id number]      Accession #:    [id number] Date of Birth: 1923/09/27      Patient Gender: F Patient Age:   72 years Exam Location:  [facility] Procedure:      VAS [place] LOWER EXTREMITY VENOUS (DVT) Referring Phys: Audrie Gallus ROEMHILDT --------------------------------------------------------------------------------   Indications: Edema, and Swelling.  Comparison Study: no prior Performing Technologist: Archie Patten RVS  Examination Guidelines: A complete evaluation includes B-mode imaging, spectral Doppler, color Doppler, and power Doppler as needed of all accessible portions of each vessel. Bilateral testing is considered an integral part of a complete examination. Limited examinations for reoccurring indications may be performed as noted. The reflux portion of the exam is performed with the patient in reverse Trendelenburg.  +---------+---------------+---------+-----------+----------+---------------+  Transmetatarsal amputation +---------+------------------+-----+----------+--------------------------+ +---------+------------------+-----+--------+-------+ Left     Lt Pressure (mmHg)IndexWaveformComment +---------+------------------+-----+--------+-------+ Brachial 171                                    +---------+------------------+-----+--------+-------+ PTA      0                 0.00                 +---------+------------------+-----+--------+-------+ PERO     255               [place]   biphasic        +---------+------------------+-----+--------+-------+ DP       0                 0.00                 +---------+------------------+-----+--------+-------+ Great Toe65                0.38                 +---------+------------------+-----+--------+-------+ +-------+----------------+-----------+----------------+------------+ ABI/TBIToday's ABI     Today's TBIPrevious ABI    Previous TBI  +-------+----------------+-----------+----------------+------------+ Right  0.86            amputation 1.06            amputation   +-------+----------------+-----------+----------------+------------+ Left   Non compressible0.38       Non compressiblenot obtained +-------+----------------+-----------+----------------+------------+ Right ABIs appear decreased compared to prior study on 11/16/2020. Left ABIs appear essentially unchanged compared to prior study on 11/16/2020.  Summary: Right: Resting right ankle-brachial index indicates mild right lower extremity arterial disease. Left: Resting left ankle-brachial index indicates noncompressible left lower extremity arteries. The left toe-brachial index is abnormal.  *See table(s) above for measurements and observations.  Electronically signed by Monica Martinez MD on 01/25/2021 at 4:27:47 PM.    Final    DG FEMUR PORT, MIN 2 VIEWS RIGHT  Result Date: 02/14/2021 CLINICAL DATA:  Pain. EXAM: RIGHT FEMUR PORTABLE 2 VIEW COMPARISON:  None. FINDINGS: The bones are under mineralized. Cortical margins of the femur are intact. There is no evidence of fracture or other focal bone lesions. Hip and knee alignment are maintained. There is no periosteal reaction or bony destruction. Vascular calcifications. Soft tissues are otherwise unremarkable. IMPRESSION: No acute osseous abnormality or explanation for pain. Electronically Signed   By: Keith Rake M.D.   On: 02/14/2021 18:42   VAS [place] LOWER EXTREMITY VENOUS (DVT) (7a-7p)  Result Date: 02/14/2021  Lower Venous DVT Study Patient Name:  Shannon [name]  Date of Exam:   02/14/2021 Medical Rec #: [id number]      Accession #:    [id number] Date of Birth: 1923/09/27      Patient Gender: F Patient Age:   72 years Exam Location:  [facility] Procedure:      VAS [place] LOWER EXTREMITY VENOUS (DVT) Referring Phys: Audrie Gallus ROEMHILDT --------------------------------------------------------------------------------   Indications: Edema, and Swelling.  Comparison Study: no prior Performing Technologist: Archie Patten RVS  Examination Guidelines: A complete evaluation includes B-mode imaging, spectral Doppler, color Doppler, and power Doppler as needed of all accessible portions of each vessel. Bilateral testing is considered an integral part of a complete examination. Limited examinations for reoccurring indications may be performed as noted. The reflux portion of the exam is performed with the patient in reverse Trendelenburg.  +---------+---------------+---------+-----------+----------+---------------+  Transmetatarsal amputation +---------+------------------+-----+----------+--------------------------+ +---------+------------------+-----+--------+-------+ Left     Lt Pressure (mmHg)IndexWaveformComment +---------+------------------+-----+--------+-------+ Brachial 171                                    +---------+------------------+-----+--------+-------+ PTA      0                 0.00                 +---------+------------------+-----+--------+-------+ PERO     255               [place]   biphasic        +---------+------------------+-----+--------+-------+ DP       0                 0.00                 +---------+------------------+-----+--------+-------+ Great Toe65                0.38                 +---------+------------------+-----+--------+-------+ +-------+----------------+-----------+----------------+------------+ ABI/TBIToday's ABI     Today's TBIPrevious ABI    Previous TBI  +-------+----------------+-----------+----------------+------------+ Right  0.86            amputation 1.06            amputation   +-------+----------------+-----------+----------------+------------+ Left   Non compressible0.38       Non compressiblenot obtained +-------+----------------+-----------+----------------+------------+ Right ABIs appear decreased compared to prior study on 11/16/2020. Left ABIs appear essentially unchanged compared to prior study on 11/16/2020.  Summary: Right: Resting right ankle-brachial index indicates mild right lower extremity arterial disease. Left: Resting left ankle-brachial index indicates noncompressible left lower extremity arteries. The left toe-brachial index is abnormal.  *See table(s) above for measurements and observations.  Electronically signed by Monica Martinez MD on 01/25/2021 at 4:27:47 PM.    Final    DG FEMUR PORT, MIN 2 VIEWS RIGHT  Result Date: 02/14/2021 CLINICAL DATA:  Pain. EXAM: RIGHT FEMUR PORTABLE 2 VIEW COMPARISON:  None. FINDINGS: The bones are under mineralized. Cortical margins of the femur are intact. There is no evidence of fracture or other focal bone lesions. Hip and knee alignment are maintained. There is no periosteal reaction or bony destruction. Vascular calcifications. Soft tissues are otherwise unremarkable. IMPRESSION: No acute osseous abnormality or explanation for pain. Electronically Signed   By: Keith Rake M.D.   On: 02/14/2021 18:42   VAS [place] LOWER EXTREMITY VENOUS (DVT) (7a-7p)  Result Date: 02/14/2021  Lower Venous DVT Study Patient Name:  Shannon [name]  Date of Exam:   02/14/2021 Medical Rec #: [id number]      Accession #:    [id number] Date of Birth: 1923/09/27      Patient Gender: F Patient Age:   72 years Exam Location:  [facility] Procedure:      VAS [place] LOWER EXTREMITY VENOUS (DVT) Referring Phys: Audrie Gallus ROEMHILDT --------------------------------------------------------------------------------   Indications: Edema, and Swelling.  Comparison Study: no prior Performing Technologist: Archie Patten RVS  Examination Guidelines: A complete evaluation includes B-mode imaging, spectral Doppler, color Doppler, and power Doppler as needed of all accessible portions of each vessel. Bilateral testing is considered an integral part of a complete examination. Limited examinations for reoccurring indications may be performed as noted. The reflux portion of the exam is performed with the patient in reverse Trendelenburg.  +---------+---------------+---------+-----------+----------+---------------+  tube in the stomach with balloon appropriately positioned and opposed to the anterior abdominal wall. Tubing is looped in the stomach, tip of the tube is in the 3/4 portion of the duodenum. There is no abnormality along the subcutaneous portion of the gastrostomy tube. Overall stomach is decompressed. Chain sutures about the anterior stomach. Median ventral abdominal wall hernia contains short segment of transverse colon. There is no associated wall thickening or obstruction. Multifocal colonic diverticulosis without diverticulitis. There is also mild distal ileal diverticulosis. No small bowel obstruction. Enteric anastomosis in the left abdomen again seen, with slightly patulous appearance of the anastomotic segment, but noninflamed. Vascular/Lymphatic: Aortic atherosclerosis without aneurysm. Advanced bi-iliac atherosclerosis. Surgical clips within the right groin likely at site of prior vascular access. There is no portal venous or mesenteric gas. No bulky abdominopelvic adenopathy. Reproductive: Normal for age uterine atrophy.  No adnexal mass. Other: No free air or ascites. Ventral abdominal wall hernia, right paramedian hernia contains a nonobstructed noninflamed transverse colon. Left paramidline hernia contains only fat. Anterior to the left hip in the subcutaneous tissues is a 2.7 x 1.5 cm ovoid soft tissue density. This was present on multiple prior exams, and  is stable over the course of 10 months, but overall increased in size from 2020 exam. Assessment margins and no surrounding inflammation. Musculoskeletal: The bones are diffusely under mineralized. No acute osseous findings. Minimal soft tissue thickening overlying the sacrum but no evidence of abscess no focal fluid collection. IMPRESSION: 1. No acute findings. 2. Gastrojejunostomy tube in the stomach with balloon appropriately positioned and opposed to the anterior abdominal wall. Tubing is looped in the stomach, tip of the tube is in the 3/4 portion of the duodenum. No abnormality along the subcutaneous portion of the gastrostomy tube. 3. Ventral abdominal wall hernias. Right paramedial hernia contains nonobstructed noninflamed transverse colon, and is unchanged from prior imaging. 4. Ovoid soft tissue density anterior to the left hip in the subcutaneous tissues measuring 2.7 x 1.5 cm, stable over the course of 10 months, but overall increased in size from 2020 exam. Etiology and significance is uncertain, however of doubtful clinical significance in a patient of this age. 5. Colonic diverticulosis without diverticulitis. Aortic Atherosclerosis (ICD10-I70.0). Electronically Signed   By: Keith Rake M.D.   On: 02/14/2021 19:54   CT Head Wo Contrast  Result Date: 02/14/2021 CLINICAL DATA:  Head trauma, minor (Age >= 65y) EXAM: CT HEAD WITHOUT CONTRAST TECHNIQUE: Contiguous axial images were obtained from the base of the skull through the vertex without intravenous contrast. COMPARISON:  12/28/2020 FINDINGS: Brain: There is no acute intracranial hemorrhage, mass effect, or edema. No new loss of gray-white differentiation. There is no extra-axial fluid collection. Prominence of the ventricles and sulci reflects stable parenchymal volume loss. Patchy hypoattenuation in the supratentorial white matter likely reflects stable chronic microvascular ischemic changes. 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 Vascular: There is atherosclerotic calcification at the skull base. Skull: Calvarium is unremarkable. Sinuses/Orbits: No acute finding. Other: None. IMPRESSION: No evidence of acute intracranial injury. Electronically Signed   By: Macy Mis M.D.   On: 02/14/2021 16:56   VAS [place] ABI WITH/WO TBI  Result Date: 01/25/2021  LOWER EXTREMITY DOPPLER STUDY Patient Name:  Shannon [name]  Date of Exam:   01/25/2021 Medical Rec #: [id number]      Accession #:    [id number] Date of Birth: December 11, 1923      Patient Gender: F Patient Age:   48 years Exam Location:

## 2021-02-16 NOTE — Progress Notes (Addendum)
Daily Rounding Note  02/16/2021, 9:27 AM  LOS: 2 days   SUBJECTIVE:   Chief complaint:   FOBT +, dark stools.   Anemia  No BM's.  Normally takes Senokot 2 x daily.  No nausea or abd pain.  Tolerating nocturnal TFs and FL diet.  OBJECTIVE:         Vital signs in last 24 hours:    Temp:  [97.4 F (36.3 C)-98.4 F (36.9 C)] 97.4 F (36.3 C) (11/16 0739) Pulse Rate:  [79-87] 79 (11/16 0739) Resp:  [13-26] 15 (11/16 0739) BP: (143-187)/(52-166) 175/70 (11/16 0739) SpO2:  [98 %-100 %] 100 % (11/16 0739) Last BM Date: 02/13/21 Filed Weights   02/14/21 1607  Weight: 57.2 kg   General: frail.  Alert.  comfortable   Heart: RRR Chest: no labored breathing Abdomen: soft, NT, clean bandage over G tube site.  Extremities: thin, no edema.  Mid foot and toe amputations Neuro/Psych:  pleasant, calm.  Oriented w/O confusion.     Lab Results: Recent Labs    02/14/21 1447 02/15/21 0345 02/15/21 0947 02/15/21 1329 02/16/21 0115  WBC 9.2  --   --   --  7.5  HGB 5.3*   < > 9.4* 9.3* 9.4*  HCT 17.2*   < > 28.0* 27.4* 28.7*  PLT 300  --   --   --  248   < > = values in this interval not displayed.   BMET Recent Labs    02/14/21 1447 02/15/21 0947 02/16/21 0115  NA 139 143 139  K 5.0 4.2 4.2  CL 109 112* 110  CO2 24 22 20*  GLUCOSE 137* 118* 163*  BUN 91* 67* 52*  CREATININE 1.80* 1.37* 1.31*  CALCIUM 9.1 9.2 8.9   LFT Recent Labs    02/14/21 1447  PROT 7.0  ALBUMIN 3.0*  AST 22  ALT 17  ALKPHOS 76  BILITOT 0.3   PT/INR Recent Labs    02/14/21 1634  LABPROT 14.7  INR 1.2  Scheduled Meds:  amLODipine  5 mg Oral Daily   atorvastatin  10 mg Oral Daily   DULoxetine  30 mg Oral Daily   feeding supplement (OSMOLITE 1.5 CAL)  711 mL Per Tube Q24H   free water  180 mL Per Tube TID   hydrALAZINE  50 mg Oral TID   latanoprost  1 drop Both Eyes QHS   levothyroxine  125 mcg Oral Q0600   pantoprazole  40  mg Oral BID   sucralfate  1 g Oral Q6H   Continuous Infusions: PRN Meds:.acetaminophen **OR** acetaminophen, ondansetron **OR** ondansetron (ZOFRAN) IV, oxyCODONE   ASSESMENT:     FOBT + dark stools.  EGD of 10/2020 showed moderately severe esophagitis, nonobstructing esophageal stenosis, gastric polyps.  Patient compliant w Protonix bid, had stopped taking sucralfate.  Sucralfate restarted 11/15.      Blood loss anemia.    Hgb 5.3 >> 2 PRBC >> 9.4 >> 9.4.       LE PVD, on Plavix PTA, 81 ASA.  Plavix now on hold.  Lower extremity angioplasty 10/2020.  Bone biopsy, wound debridement 12/26/2020 to address osteomyelitis.    Pyloric stenosis.  Perc feeding tube in place, mangaged by IR.      PLAN     ? If Plavix will be restarted?  Should probably have vascular MD Leroy Libman MD) weigh in.      Add Senokot-S bid as per home  routine.     Shannon [name]  02/16/2021, 9:27 AM Phone [phone]    ________________________________________________________________________  Velora Heckler GI MD note:  I personally examined the patient, reviewed the data and agree with the assessment and plan described above.  No BM in 2-3 days, clearly whatever bleeding she was having has stopped.  Seems that the risk of blood thinners may outweigh the benefits at this point.  Should consider not resuming plavix. She needs to stay on BID PPI and carafate QID.  Seems that her G tube site is leaking TFs at home (around the tube).  This has been a problem in the past. Would as IR to given input, perhaps Tube check.  Owens Loffler, MD [facility] Pager [phone]

## 2021-02-16 NOTE — Consult Note (Signed)
[facility] Nurse Consult Note: Patient receiving care in [facility] 5W10. I spoke with the primary nurse, Larence Penning, via telephone in completing this consult order Reason for Consult: irritation around g tube Wound type: MASD from gastric backflow through the G tube insertion site Pressure Injury POA: Yes/No/NA Measurement: Wound bed: slightly reddened, some what raw Drainage (amount, consistency, odor)  Periwound: intact Dressing procedure/placement/frequency: Wash around G tube site with soap and water, pat dry. Apply Desitin to irritated area, then Drawtex dressing Kellie Simmering [phone]), which can be ordered from [employer]. [facility] nurse will not follow at this time.  Please re-consult the [facility] team if needed.  Val Riles, RN, MSN, CWOCN, CNS-BC, pager [phone]

## 2021-02-16 NOTE — Consult Note (Signed)
[facility] [place]  ASSESSMENT / PLAN: Shannon [name] is a 87 y.o. female with atherosclerosis of native arteries of bilateral lower extremities causing ulceration.  She underwent left popliteal angioplasty 10/20/20.  This was complicated by hypotension and hematoma which required common femoral exploration. She underwent right lower extremity angiography which revealed in-line flow to the foot 12/24/20.  Recommend the following which can slow the progression of atherosclerosis and reduce the risk of major adverse cardiac / limb events:  Complete cessation from all tobacco products. Blood glucose control with goal A1c < 7%. Blood pressure control with goal blood pressure < 140/90 mmHg. Lipid reduction therapy with goal LDL-C <100 mg/dL (<70 if symptomatic from PAD).  Aspirin 81mg  PO QD.  Atorvastatin 40-80mg  PO QD (or other "high intensity" statin therapy).  OK to stop Plavix completely. Continue ASA / Statin indefinitely. Follow up with me as needed.  CHIEF COMPLAINT: Ulceration about the left great toe  HISTORY OF PRESENT ILLNESS: Shannon [name] is a 87 y.o. female known to our practice who is referred from her podiatrist for evaluation of ulceration about her left great toe.  The patient is ambulatory, and able to get around her house.  She is fairly independent.  She has no ischemic rest pain or claudication-like symptoms in her lower extremities, however.  She had critical limb ischemia of her right lower extremity in the past which was treated endovascularly by our group.  While she healed a transmetatarsal amputation on that side she now has 2 areas of ulceration on the plantar aspect of her right foot.  12/23/20: Inpatient consultation requested by internal medicine service.  Patient has not undergone any left foot surgery for ulceration.  Vascular surgery consultation was requested to comment on adequacy of circulation and potential for healing.  Continues to have  ulceration about her her bilateral feet.  Both appear improved, compared to July.  The patient has pain in her feet.  She has no other complaints.  02/16/21: Inpatient consultation requested by the internal medicine service.  The patient continues to improve from a foot ulcer standpoint.  She is admitted for a GI bleed.  I was asked to comment on her antiplatelet regimen.  VASCULAR SURGICAL HISTORY: 10/20/20: Right groin exploration for bleeding 10/20/20 - left popliteal angioplasty 09/21/16 - left superficial femoral artery angioplasty 09/25/16 - left second and third toe amputation 06/27/17 - right femoral-popliteal angioplasty, tibioperoneal trunk angioplasty 05/03/18 - right great toe amputation  Past Medical History:  Diagnosis Date   Acute respiratory failure ([place])    Anemia    previous blood transfusions   Arthritis    "all over"   Asthma    Atrial flutter (HCC)    Bradycardia    requiring previous d/c of BB and reduction of amiodarone   CAD (coronary artery disease)    nonobstructive per notes   Chronic diastolic CHF (congestive heart failure) ([place])    CKD (chronic kidney disease), stage III ([place])    Complication of blood transfusion    "got the wrong blood type at [facility] in ~ 2015; no adverse reaction that we are aware of"/daughter, [name] (01/27/2016)   COPD (chronic obstructive pulmonary disease) ([place])    Depression    "light case"   DVT (deep venous thrombosis) ([facility]) 01/2016   a. LLE DVT 01/2016 - switched from Eliquis to Coumadin.   Dyspnea    with some activity   Gastric stenosis    a. s/p stomach  tube   GERD (gastroesophageal reflux disease)    Headache    History of 2019 novel coronavirus disease (COVID-19)    History of blood transfusion    "several" (01/27/2016)   History of stomach ulcers    Hyperlipidemia    Hypertension    Hypothyroidism    On home oxygen therapy    "2L; 9PM til 9AM" (06/27/2017)   PAD (peripheral artery disease) (H[facility])    a. prior  gangrene, toe amputations, intervention.   PAF (paroxysmal atrial fibrillation) ([place])    Paraesophageal hernia    Perforated gastric ulcer ([place])    Pneumonia    "a few times" (06/27/2017)   Seasonal allergies    SIADH (syndrome of inappropriate ADH production) ([place])    [name] 01/10/2015   Small bowel obstruction ([place])    "I don't know how many" (01/11/2015)   Stroke ([place])    several- no residual   Type II diabetes mellitus ([place])    "related to prednisone use  for > 20 yr; once predinose stopped; no more DM RX" (01/27/2016)   UTI (urinary tract infection) 02/08/2016   Ventral hernia with bowel obstruction     Past Surgical History:  Procedure Laterality Date   ABDOMINAL AORTOGRAM N/A 09/21/2016   Procedure: Abdominal Aortogram;  Surgeon: Waynetta Sandy, MD;  Location: [facility];  Service: Cardiovascular;  Laterality: N/A;   ABDOMINAL AORTOGRAM W/LOWER EXTREMITY N/A 10/20/2020   Procedure: ABDOMINAL AORTOGRAM W/LOWER EXTREMITY;  Surgeon: Cherre Robins, MD;  Location: [facility];  Service: Cardiovascular;  Laterality: N/A;   AMPUTATION Left 09/25/2016   Procedure: AMPUTATION DIGIT- LEFT 2ND AND 3RD TOES;  Surgeon: Rosetta Posner, MD;  Location: [facility];  Service: Vascular;  Laterality: Left;   AMPUTATION Right 05/03/2018   Procedure: AMPUTATION RIGHT GREAT TOE;  Surgeon: Rosetta Posner, MD;  [facility];  Service: Vascular;  Laterality: Right;   CATARACT EXTRACTION W/ INTRAOCULAR LENS  IMPLANT, BILATERAL     CHOLECYSTECTOMY OPEN     COLECTOMY     ESOPHAGOGASTRODUODENOSCOPY N/A 01/19/2014   Procedure: ESOPHAGOGASTRODUODENOSCOPY (EGD);  Surgeon: Irene Shipper, MD;  Location: [facility];  Service: Endoscopy;  Laterality: N/A;   ESOPHAGOGASTRODUODENOSCOPY N/A 01/20/2014   Procedure: ESOPHAGOGASTRODUODENOSCOPY (EGD);  Surgeon: Irene Shipper, MD;  Location: [facility];  Service: Endoscopy;  Laterality: N/A;   ESOPHAGOGASTRODUODENOSCOPY N/A 03/19/2014    Procedure: ESOPHAGOGASTRODUODENOSCOPY (EGD);  Surgeon: Milus Banister, MD;  Location: [facility];  Service: Endoscopy;  Laterality: N/A;   ESOPHAGOGASTRODUODENOSCOPY N/A 07/08/2015   Procedure: ESOPHAGOGASTRODUODENOSCOPY (EGD);  Surgeon: Doran Stabler, MD;  Location: [facility];  Service: Endoscopy;  Laterality: N/A;   ESOPHAGOGASTRODUODENOSCOPY (EGD) WITH PROPOFOL N/A 09/15/2015   Procedure: ESOPHAGOGASTRODUODENOSCOPY (EGD) WITH PROPOFOL;  Surgeon: Manus Gunning, MD;  [facility]: [facility];  Service: Gastroenterology;  Laterality: N/A;   ESOPHAGOGASTRODUODENOSCOPY (EGD) WITH PROPOFOL N/A 10/12/2020   Procedure: ESOPHAGOGASTRODUODENOSCOPY (EGD) WITH PROPOFOL;  Surgeon: Gatha Mayer, MD;  Location: [facility];  Service: Endoscopy;  Laterality: N/A;   FOOT SURGERY     GASTROJEJUNOSTOMY     hx/notes 01/10/2015   GASTROJEJUNOSTOMY N/A 09/23/2015   Procedure: OPEN GASTROJEJUNOSTOMY TUBE PLACEMENT;  Surgeon: Arta Bruce Kinsinger, MD;  [facility];  Service: General;  Laterality: N/A;   GLAUCOMA SURGERY Bilateral    HEMATOMA EVACUATION Right 10/20/2020   Procedure: EVACUATION HEMATOMA RIGHT GROIN;  Surgeon: Cherre Robins, MD;  Location: [facility];  Service: Vascular;  Laterality: Right;  IR CM INJ ANY COLONIC TUBE W/FLUORO  01/05/2017   IR CM INJ ANY COLONIC TUBE W/FLUORO  06/07/2017   IR CM INJ ANY COLONIC TUBE W/FLUORO  11/05/2017   IR CM INJ ANY COLONIC TUBE W/FLUORO  09/18/2018   IR CM INJ ANY COLONIC TUBE W/FLUORO  03/06/2019   IR CM INJ ANY COLONIC TUBE W/FLUORO  03/11/2019   IR GASTR TUBE CONVERT GASTR-JEJ PER W/FL MOD SED  04/30/2020   IR GENERIC HISTORICAL  01/07/2016   IR GJ TUBE CHANGE 01/07/2016 Jacqulynn Cadet, MD [facility]   IR GENERIC HISTORICAL  01/27/2016   IR MECH REMOV OBSTRUC MAT ANY COLON TUBE W/FLUORO 01/27/2016 Markus Daft, MD [facility]   IR GENERIC HISTORICAL  02/07/2016   IR PATIENT EVAL TECH 0-60 MINS Darrell K Allred, PA-C [facility]    IR GENERIC HISTORICAL  02/08/2016   IR GJ TUBE CHANGE 02/08/2016 Greggory Keen, MD [facility]   IR GENERIC HISTORICAL  01/06/2016   IR GJ TUBE CHANGE 01/06/2016 [facility] OUT REF   IR GENERIC HISTORICAL  05/02/2016   IR CM INJ ANY COLONIC TUBE W/FLUORO 05/02/2016 Arne Cleveland, MD [facility]   IR GENERIC HISTORICAL  05/15/2016   IR GJ TUBE CHANGE 05/15/2016 Sandi Mariscal, MD [facility]   IR GENERIC HISTORICAL  06/28/2016   IR GJ TUBE CHANGE 06/28/2016 [facility]   IR GJ TUBE CHANGE  02/20/2017   IR GJ TUBE CHANGE  05/10/2017   IR GJ TUBE CHANGE  07/06/2017   IR GJ TUBE CHANGE  08/02/2017   IR GJ TUBE CHANGE  10/15/2017   IR GJ TUBE CHANGE  12/26/2017   IR GJ TUBE CHANGE  04/08/2018   IR GJ TUBE CHANGE  06/19/2018   IR GJ TUBE CHANGE  03/03/2019   IR GJ TUBE CHANGE  06/06/2019   IR GJ TUBE CHANGE  09/26/2019   IR GJ TUBE CHANGE  01/09/2020   IR GJ TUBE CHANGE  05/12/2020   IR GJ TUBE CHANGE  10/05/2020   IR GJ TUBE CHANGE  10/14/2020   IR GJ TUBE CHANGE  10/19/2020   IR PATIENT EVAL TECH 0-60 MINS  10/19/2016   IR PATIENT EVAL TECH 0-60 MINS  12/25/2016   IR PATIENT EVAL TECH 0-60 MINS  01/29/2017   IR PATIENT EVAL TECH 0-60 MINS  04/04/2017   IR PATIENT EVAL TECH 0-60 MINS  04/30/2017   IR PATIENT EVAL TECH 0-60 MINS  07/31/2017   IR REPLC DUODEN/JEJUNO TUBE PERCUT W/FLUORO  11/14/2016   IRRIGATION AND DEBRIDEMENT FOOT Bilateral 12/26/2020   Procedure: IRRIGATION AND DEBRIDEMENT FOOT AND BONE BIOPSY;  Surgeon: Trula Slade, DPM;  Location: [facility];  Service: Podiatry;  Laterality: Bilateral;   LAPAROTOMY N/A 01/20/2015   Procedure: EXPLORATORY LAPAROTOMY;  Surgeon: Coralie Keens, MD;  Location: [facility];  Service: General;  Laterality: N/A;   LOWER EXTREMITY ANGIOGRAPHY Left 09/21/2016   Procedure: Lower Extremity Angiography;  Surgeon: Waynetta Sandy, MD;  Location: [facility];  Service: Cardiovascular;  Laterality: Left;   LOWER EXTREMITY  ANGIOGRAPHY Right 06/27/2017   Procedure: Lower Extremity Angiography;  Surgeon: Waynetta Sandy, MD;  Location: [facility];  Service: Cardiovascular;  Laterality: Right;   LOWER EXTREMITY ANGIOGRAPHY Right 12/24/2020   Procedure: LOWER EXTREMITY ANGIOGRAPHY;  Surgeon: Cherre Robins, MD;  Location: [facility];  Service: Cardiovascular;  Laterality: Right;   LYSIS OF ADHESION N/A 01/20/2015   Procedure: LYSIS OF ADHESIONS <  1 HOUR;  Surgeon: Coralie Keens, MD;  Location: [facility];  Service: General;  Laterality: N/A;   PERIPHERAL VASCULAR BALLOON ANGIOPLASTY Left 09/21/2016   Procedure: Peripheral Vascular Balloon Angioplasty;  Surgeon: Waynetta Sandy, MD;  Location: [facility];  Service: Cardiovascular;  Laterality: Left;  drug coated balloon   PERIPHERAL VASCULAR BALLOON ANGIOPLASTY Right 06/27/2017   Procedure: PERIPHERAL VASCULAR BALLOON ANGIOPLASTY;  Surgeon: Waynetta Sandy, MD;  Location: [facility];  Service: Cardiovascular;  Laterality: Right;  [id number]   PERIPHERAL VASCULAR INTERVENTION Left 10/20/2020   Procedure: PERIPHERAL VASCULAR INTERVENTION;  Surgeon: Cherre Robins, MD;  Location: [facility];  Service: Cardiovascular;  Laterality: Left;   TONSILLECTOMY     TRANSMETATARSAL AMPUTATION Right 11/14/2019   Procedure: TRANSMETATARSAL AMPUTATION RIGHT FOOT;  Surgeon: Edrick Kins, DPM;  Location: [facility];  Service: Podiatry;  Laterality: Right;   TUBAL LIGATION     VENTRAL HERNIA REPAIR  2015   incarcerated ventral hernia ([facility] 09/2013)/notes 01/10/2015   WOUND EXPLORATION Right 10/20/2020   Procedure: PRIMARY CLOSURE COMMON FEMORAL ARTERY;  Surgeon: Cherre Robins, MD;  Location: [facility];  Service: Vascular;  Laterality: Right;    Family History  Problem Relation Age of Onset   Stroke Mother    Hypertension Mother    Diabetes Brother    Glaucoma Son    Glaucoma Son    Heart attack Neg Hx    Colon  cancer Neg Hx    Stomach cancer Neg Hx     Social History   Socioeconomic History   Marital status: Widowed    Spouse name: Not on file   Number of children: 3   Years of education: Not on file   Highest education level: Not on file  Occupational History   Not on file  Tobacco Use   Smoking status: Never   Smokeless tobacco: Former    Types: Snuff    Quit date: 04/03/1980   Tobacco comments:    "used snuff in my younger days"  Vaping Use   Vaping Use: Never used  Substance and Sexual Activity   Alcohol use: Never    Alcohol/week: 0.0 standard drinks   Drug use: Never   Sexual activity: Not Currently  Other Topics Concern   Not on file  Social History Narrative   Married in 1947, lives in one story house with 2 people and no petsOccupation: ?Has a living Will, [place], and doesn't have DNRWas living with Husband (in frail health) in [place] until 09/2013.  After her [facility] admission they both came to live with daughter in [place] Strain: Not on file  Food Insecurity: Not on file  Transportation Needs: Not on file  Physical Activity: Not on file  Stress: Not on file  Social Connections: Not on file  Intimate Partner Violence: Not on file    Allergies  Allergen Reactions   Penicillins Itching, Rash and Other (See Comments)    Tolerated amoxicillin, unasyn, zosyn & cephalosporins in the past. Did it involve swelling of the face/tongue/throat, SOB, or low BP? No Did it involve sudden or severe rash/hives, skin peeling, or any reaction on the inside of your mouth or nose? No Did you need to seek medical attention at a hospital or doctor's office? Unknown When did it last happen?      5+ years If all above answers are "NO", may proceed  with cephalosporin use.     Current Facility-Administered Medications  Medication Dose Route Frequency Provider Last Rate Last Admin   acetaminophen (TYLENOL) tablet 650  mg  650 mg Oral Q6H PRN Etta Quill, DO       Or   acetaminophen (TYLENOL) suppository 650 mg  650 mg Rectal Q6H PRN Etta Quill, DO       amLODipine (NORVASC) tablet 5 mg  5 mg Oral Daily Patrecia Pour, MD   5 mg at 02/16/21 1051   atorvastatin (LIPITOR) tablet 10 mg  10 mg Oral Daily Patrecia Pour, MD   10 mg at 02/16/21 1051   DULoxetine (CYMBALTA) DR capsule 30 mg  30 mg Oral Daily Patrecia Pour, MD   30 mg at 02/16/21 1051   feeding supplement (OSMOLITE 1.5 CAL) liquid 711 mL  711 mL Per Tube Q24H Patrecia Pour, MD 65 mL/hr at 02/15/21 2116 711 mL at 02/15/21 2116   free water 180 mL  180 mL Per Tube TID Patrecia Pour, MD   180 mL at 02/15/21 2147   hydrALAZINE (APRESOLINE) tablet 50 mg  50 mg Oral TID Patrecia Pour, MD   50 mg at 02/16/21 1051   latanoprost (XALATAN) 0.005 % ophthalmic solution 1 drop  1 drop Both Eyes QHS Patrecia Pour, MD   1 drop at 02/15/21 2147   levothyroxine (SYNTHROID) tablet 125 mcg  125 mcg Oral Q0600 Patrecia Pour, MD   125 mcg at 02/16/21 9935   liver oil-zinc oxide (DESITIN) 40 % ointment   Topical BID Regalado, Belkys A, MD   Given at 02/16/21 1535   ondansetron (ZOFRAN) tablet 4 mg  4 mg Oral Q6H PRN Etta Quill, DO       Or   ondansetron [facility]) injection 4 mg  4 mg Intravenous Q6H PRN Etta Quill, DO       oxyCODONE (Oxy IR/ROXICODONE) immediate release tablet 2.5-5 mg  2.5-5 mg Oral Q8H PRN Patrecia Pour, MD       pantoprazole (PROTONIX) EC tablet 40 mg  40 mg Oral BID Patrecia Pour, MD   40 mg at 02/16/21 1051   senna-docusate (Senokot-S) tablet 1 tablet  1 tablet Oral BID Vena Rua, PA-C   1 tablet at 02/16/21 1243   sucralfate (CARAFATE) 1 GM/10ML suspension 1 g  1 g Oral Q6H Vena Rua, PA-C   1 g at 02/16/21 1233    REVIEW OF SYSTEMS:  [X]  denotes positive finding, [ ]  denotes negative finding Cardiac  Comments:  Chest pain or chest pressure:    Shortness of breath upon exertion:    Short of breath when lying  flat:    Irregular heart rhythm:        Vascular    Pain in calf, thigh, or hip brought on by ambulation:    Pain in feet at night that wakes you up from your sleep:     Blood clot in your veins:    Leg swelling:         Pulmonary    Oxygen at home:    Productive cough:     Wheezing:         Neurologic    Sudden weakness in arms or legs:     Sudden numbness in arms or legs:     Sudden onset of difficulty speaking or slurred speech:    Temporary loss of vision in one eye:  Problems with dizziness:         Gastrointestinal    Blood in stool:     Vomited blood:         Genitourinary    Burning when urinating:     Blood in urine:        Psychiatric    Major depression:         Hematologic    Bleeding problems:    Problems with blood clotting too easily:        Skin    Rashes or ulcers:        Constitutional    Fever or chills:      PHYSICAL EXAM Vitals:   02/16/21 0351 02/16/21 0739 02/16/21 1238 02/16/21 1239  BP: (!) 165/63 (!) 175/70 (!) 169/66   Pulse: 86 79    Resp: 15 15 20    Temp: 98.4 F (36.9 C) (!) 97.4 F (36.3 C)  (!) 97.4 F (36.3 C)  TempSrc: Oral Oral  Oral  SpO2: 98% 100%  97%  Weight:      Height:        Constitutional: Elderly. No distress. Appears poorly nourished.  Neurologic: CN intact. no focal findings. no sensory loss. Psychiatric:  Mood and affect symmetric and appropriate. Eyes:  No icterus. No conjunctival pallor. Ears, nose, throat:  mucous membranes moist. Midline trachea.  Cardiac: regular rate and rhythm.  Respiratory:  unlabored. Abdominal:  soft, non-tender, non-distended.  Peripheral vascular:   2+ R popliteal pulse  2+ L popliteal pulse  Absent pedal pulses bilaterally  Right groin is healed. Extremity: No edema. No cyanosis. No pallor.  Skin:  Lateral feet appear to be improving globally.  There is a small area of active ulceration in the right lateral TMA stump. Lymphatic: no Stemmer's sign. no palpable  lymphadenopathy.  PERTINENT LABORATORY AND RADIOLOGIC DATA  Most recent CBC CBC Latest Ref Rng & Units 02/16/2021 02/15/2021 02/15/2021  WBC 4.0 - 10.5 K/uL 7.5 - -  Hemoglobin 12.0 - 15.0 g/dL 9.4(L) 9.3(L) 9.4(L)  Hematocrit 36.0 - 46.0 % 28.7(L) 27.4(L) 28.0(L)  Platelets 150 - 400 K/uL 248 - -     Most recent CMP CMP Latest Ref Rng & Units 02/16/2021 02/15/2021 02/14/2021  Glucose 70 - 99 mg/dL 163(H) 118(H) 137(H)  BUN 8 - 23 mg/dL 52(H) 67(H) 91(H)  Creatinine 0.44 - 1.00 mg/dL 1.31(H) 1.37(H) 1.80(H)  Sodium 135 - 145 mmol/L 139 143 139  Potassium 3.5 - 5.1 mmol/L 4.2 4.2 5.0  Chloride 98 - 111 mmol/L 110 112(H) 109  CO2 22 - 32 mmol/L 20(L) 22 24  Calcium 8.9 - 10.3 mg/dL 8.9 9.2 9.1  Total Protein 6.5 - 8.1 g/dL - - 7.0  Total Bilirubin 0.3 - 1.2 mg/dL - - 0.3  Alkaline Phos 38 - 126 U/L - - 76  AST 15 - 41 U/L - - 22  ALT 0 - 44 U/L - - 17    Renal function Estimated Creatinine Clearance: 23.2 mL/min (A) (by C-G formula based on SCr of 1.31 mg/dL (H)).  Hgb A1c MFr Bld (% of total Hgb)  Date Value  11/04/2020 5.5    LDL Cholesterol (Calc)  Date Value Ref Range Status  11/04/2020 39 mg/dL (calc) Final    Comment:    Reference range: <100 . Desirable range <100 mg/dL for primary prevention;   <70 mg/dL for patients with CHD or diabetic patients  with > or = 2 CHD risk factors. [name] LDL-C is now  calculated using the Martin-Hopkins  calculation, which is a validated novel method providing  better accuracy than the Friedewald equation in the  estimation of LDL-C.  Cresenciano Genre et al. Annamaria Helling. 2904;753(39): 2061-2068  (http://education.[employer].com/faq/FAQ164)     [name]. Stanford Breed, MD [facility] Phone Number: [phone] 02/16/2021 3:57 PM

## 2021-02-16 NOTE — Evaluation (Signed)
Physical Therapy Evaluation Patient Details Name: Shannon [name] MRN: [id number] DOB: 1923/07/05 Today's Date: 02/16/2021  History of Present Illness  87 y.o. female presented to ED 02/14/21 with c/o generalized weakness resulting in a fall. Hgb 5.3  with +GI bleed. PMH significant of A.Flutter, HTN, CKD 3, gastric stenosis s/p PEG tube, DVT, Rt transmetatarsal amputation 12/2020; COPD home O2;  Clinical Impression   Pt admitted secondary to problem above with deficits below. PTA patient was living with daughter with nearly 24 hour assistance. She reports her MD wanted her to limit her walking and had been using her wheelchair as her primary means of locomotion (with assist).  Pt currently requires min assist for transfers due to unsteadiness.  Anticipate patient will benefit from PT to address problems listed below.Will continue to follow acutely to maximize functional mobility independence and safety.          Recommendations for follow up therapy are one component of a multi-disciplinary discharge planning process, led by the attending physician.  Recommendations may be updated based on patient status, additional functional criteria and insurance authorization.  Follow Up Recommendations No PT follow up    Assistance Recommended at Discharge Frequent or constant Supervision/Assistance  Functional Status Assessment Patient has had a recent decline in their functional status and/or demonstrates limited ability to make significant improvements in function in a reasonable and predictable amount of time  Equipment Recommendations  None recommended by PT    Recommendations for Other Services OT consult     Precautions / Restrictions Precautions Precautions: Fall;Other (comment) Precaution Comments: PEG tube Restrictions Weight Bearing Restrictions: No Other Position/Activity Restrictions: per pt, MD wants her to limit her walking due to foot wounds      Mobility  Bed Mobility Overal  bed mobility: Needs Assistance Bed Mobility: Supine to Sit     Supine to sit: Min guard     General bed mobility comments: incr time and effort with near need for assist    Transfers Overall transfer level: Needs assistance Equipment used: Rolling walker (2 wheels) Transfers: Bed to chair/wheelchair/BSC;Sit to/from Stand Sit to Stand: Min assist   Step pivot transfers: Min assist       General transfer comment: slight posterior lean upon standing 1st time; improved on second sit to stand; kyphotic posture and steadying assist bed to chair    Ambulation/Gait               General Gait Details: deferred due to pt reporting MD wants her to limit her walking due to foot wounds  Stairs            Wheelchair Mobility    Modified Rankin (Stroke Patients Only)       Balance Overall balance assessment: Needs assistance Sitting-balance support: No upper extremity supported;Feet supported Sitting balance-Leahy Scale: Fair     Standing balance support: Bilateral upper extremity supported;Reliant on assistive device for balance Standing balance-Leahy Scale: Poor                               Pertinent Vitals/Pain Pain Assessment: No/denies pain    Home Living Family/patient expects to be discharged to:: Private residence Living Arrangements: Children Available Help at Discharge: Available 24 hours/day Type of Home: House Home Access: Ramped entrance       Home Layout: One level Home Equipment: Rollator (4 wheels);BSC/3in1;Tub bench;Wheelchair - manual      Prior Function Prior Level of  Function : Needs assist             Mobility Comments: reports usually walks with rollator without assistance; recently using wheelchair for locomotion and needs assist with transfer and propulsion       Hand Dominance   Dominant Hand: Right    Extremity/Trunk Assessment   Upper Extremity Assessment Upper Extremity Assessment: Defer to OT  evaluation    Lower Extremity Assessment Lower Extremity Assessment: Generalized weakness    Cervical / Trunk Assessment Cervical / Trunk Assessment: Kyphotic  Communication   Communication: HOH  Cognition Arousal/Alertness: Awake/alert Behavior During Therapy: WFL for tasks assessed/performed Overall Cognitive Status: Within Functional Limits for tasks assessed                                          General Comments General comments (skin integrity, edema, etc.): VSS per monitor    Exercises     Assessment/Plan    PT Assessment Patient needs continued PT services  PT Problem List Decreased strength;Decreased balance;Decreased mobility;Decreased knowledge of use of DME       PT Treatment Interventions DME instruction;Gait training;Functional mobility training;Therapeutic activities;Therapeutic exercise;Patient/family education    PT Goals (Current goals can be found in the Care Plan section)  Acute Rehab PT Goals Patient Stated Goal: to go home soon PT Goal Formulation: With patient Time For Goal Achievement: 03/02/21 Potential to Achieve Goals: Good    Frequency Min 3X/week   Barriers to discharge        Co-evaluation               AM-PAC PT "6 Clicks" Mobility  Outcome Measure Help needed turning from your back to your side while in a flat bed without using bedrails?: None Help needed moving from lying on your back to sitting on the side of a flat bed without using bedrails?: A Little Help needed moving to and from a bed to a chair (including a wheelchair)?: A Little Help needed standing up from a chair using your arms (e.g., wheelchair or bedside chair)?: A Little Help needed to walk in hospital room?: A Little Help needed climbing 3-5 steps with a railing? : A Lot 6 Click Score: 18    End of Session Equipment Utilized During Treatment: Gait belt Activity Tolerance: Patient tolerated treatment well Patient left: in chair;with  call bell/phone within reach (chair alarm broken and cannot hold batteries; pt aware to call for assist; NT made aware) Nurse Communication: Mobility status PT Visit Diagnosis: Unsteadiness on feet (R26.81);Muscle weakness (generalized) (M62.81)    Time: 8315-1761 PT Time Calculation (min) (ACUTE ONLY): 28 min   Charges:   PT Evaluation $PT Eval Low Complexity: 1 Low PT Treatments $Therapeutic Activity: 8-22 mins         Jerolyn Center, PT Acute Rehabilitation Services  Pager [phone] Office [phone]   Zena Amos 02/16/2021, 11:02 AM

## 2021-02-16 NOTE — Consult Note (Signed)
[facility] Inpatient Consult   02/16/2021  KALEEYAH STAVISH 10-Oct-1923 [id number]  [facility] [[name]]  Accountable Care Organization [ACO] Patient: Medicare CMS DCE  Primary Care Provider:  Sharon Seller, NP  Reviewed for admission with extreme high risk scores for unplanned readmission risk  Patient is currently active with [facility] [[name]] Care Management for chronic disease management services.  Patient has been engaged by a [facility].  Our community based plan of care has focused on disease management and community resource support.    Plan:  Will follow with Inpatient Transition Of Care [TOC] team member tofor progress and post hospital needs.    Of note, [facility] Care Management services does not replace or interfere with any services that are needed or arranged by inpatient [facility] care management team.  For additional questions or referrals please contact:  Charlesetta Shanks, RN BSN CCM [facility]  [phone] business mobile phone Toll free office [phone]  Fax number: [phone] [email or web address]

## 2021-02-16 NOTE — Evaluation (Signed)
Occupational Therapy Evaluation Patient Details Name: Shannon [name] MRN: [id number] DOB: 01/13/1924 Today's Date: 02/16/2021   History of Present Illness 87 y.o. female presented to ED 02/14/21 with c/o generalized weakness resulting in a fall. Hgb 5.3  with +GI bleed. PMH significant of A.Flutter, HTN, CKD 3, gastric stenosis s/p PEG tube, DVT, Rt transmetatarsal amputation 12/2020; COPD home O2;   Clinical Impression   Patient admitted for the diagnosis above.  Prior to admission she lives with her daughter, who provides assist as needed for ADL, IADL and mobility.  Patient has all DME needed, and presents close to her baseline.  No post acute needs identified, recommend home with daughter and assist as needed.       Recommendations for follow up therapy are one component of a multi-disciplinary discharge planning process, led by the attending physician.  Recommendations may be updated based on patient status, additional functional criteria and insurance authorization.   Follow Up Recommendations  No OT follow up    Assistance Recommended at Discharge None  Functional Status Assessment  Patient has not had a recent decline in their functional status  Equipment Recommendations  None recommended by OT    Recommendations for Other Services       Precautions / Restrictions Precautions Precautions: Fall;Other (comment) Precaution Comments: PEG tube Restrictions Weight Bearing Restrictions: No Other Position/Activity Restrictions: per pt, MD wants her to limit her walking due to foot wounds      Mobility Bed Mobility Overal bed mobility: Needs Assistance Bed Mobility: Supine to Sit     Supine to sit: Min guard     General bed mobility comments: up in recliner    Transfers Overall transfer level: Needs assistance Equipment used: Rolling walker (2 wheels) Transfers: Bed to chair/wheelchair/BSC;Sit to/from Stand Sit to Stand: Min assist     Step pivot transfers: Min  assist     General transfer comment: slight posterior lean upon standing 1st time; improved on second sit to stand; kyphotic posture and steadying assist bed to chair      Balance Overall balance assessment: Needs assistance Sitting-balance support: No upper extremity supported;Feet supported Sitting balance-Leahy Scale: Fair     Standing balance support: Bilateral upper extremity supported;Reliant on assistive device for balance Standing balance-Leahy Scale: Poor                             ADL either performed or assessed with clinical judgement   ADL Overall ADL's : At baseline                                       General ADL Comments: assist as needed via daughter     Vision Baseline Vision/History: 1 Wears glasses Patient Visual Report: No change from baseline       Perception Perception Perception: Not tested   Praxis Praxis Praxis: Not tested    Pertinent Vitals/Pain Pain Assessment: No/denies pain     Hand Dominance Right   Extremity/Trunk Assessment Upper Extremity Assessment Upper Extremity Assessment: Overall WFL for tasks assessed;LUE deficits/detail LUE Deficits / Details: decreased shoulder flexion LUE Sensation: WNL LUE Coordination: WNL   Lower Extremity Assessment Lower Extremity Assessment: Defer to PT evaluation   Cervical / Trunk Assessment Cervical / Trunk Assessment: Kyphotic   Communication Communication Communication: HOH   Cognition Arousal/Alertness: Awake/alert Behavior During Therapy: WFL for  tasks assessed/performed Overall Cognitive Status: Within Functional Limits for tasks assessed                                       General Comments  VSS per monitor    Exercises     Shoulder Instructions      Home Living Family/patient expects to be discharged to:: Private residence Living Arrangements: Children Available Help at Discharge: Available 24 hours/day Type of Home:  House Home Access: Ramped entrance     Home Layout: One level     Bathroom Shower/Tub: Chief Strategy Officer: Standard Bathroom Accessibility: Yes How Accessible: Accessible via walker;Accessible via wheelchair Home Equipment: Rollator (4 wheels);BSC/3in1;Tub bench;Wheelchair - manual          Prior Functioning/Environment Prior Level of Function : Needs assist       Physical Assist : ADLs (physical)   ADLs (physical): Bathing;IADLs;Dressing Mobility Comments: reports usually walks with rollator without assistance; recently using wheelchair for locomotion and needs assist with transfer and propulsion ADLs Comments: Patient stating she is able to bathe and dress herself with what sounds like Min Guard for standing.  Daughter assists with IADL and comunity mobility.        OT Problem List: Decreased activity tolerance;Impaired balance (sitting and/or standing)      OT Treatment/Interventions:      OT Goals(Current goals can be found in the care plan section) Acute Rehab OT Goals Patient Stated Goal: Hoping to return home OT Goal Formulation: With patient Time For Goal Achievement: 02/16/21 Potential to Achieve Goals: Good  OT Frequency:     Barriers to D/C:  None noted          Co-evaluation              AM-PAC OT "6 Clicks" Daily Activity     Outcome Measure Help from another person eating meals?: None Help from another person taking care of personal grooming?: A Little Help from another person toileting, which includes using toliet, bedpan, or urinal?: A Little Help from another person bathing (including washing, rinsing, drying)?: A Little Help from another person to put on and taking off regular upper body clothing?: A Little Help from another person to put on and taking off regular lower body clothing?: A Little 6 Click Score: 19   End of Session Equipment Utilized During Treatment: Gait belt;Rolling walker (2 wheels) Nurse  Communication: Mobility status  Activity Tolerance: Patient tolerated treatment well Patient left: in chair;with call bell/phone within reach;with chair alarm set  OT Visit Diagnosis: Unsteadiness on feet (R26.81)                Time: 8299-3716 OT Time Calculation (min): 13 min Charges:  OT General Charges $OT Visit: 1 Visit OT Evaluation $OT Eval Moderate Complexity: 1 Mod  02/16/2021  RP, OTR/L  Acute Rehabilitation Services  Office:  [phone]   Suzanna Obey 02/16/2021, 11:45 AM

## 2021-02-16 NOTE — Progress Notes (Signed)
Flonnie Overman to be D/C'd Home per MD order.  Discussed with the patient and all questions fully answered.  VSS, Skin clean, dry and intact without evidence of skin break down, no evidence of skin tears noted. IV catheter discontinued intact. Site without signs and symptoms of complications. Dressing and pressure applied.  An After Visit Summary was printed and given to the patient. Patient received prescription.  D/c education completed with patient/family including follow up instructions, medication list, d/c activities limitations if indicated, with other d/c instructions as indicated by MD - patient able to verbalize understanding, all questions fully answered.   Patient instructed to return to ED, call 911, or call MD for any changes in condition.   Patient escorted via [place], and D/C home via private auto.  Jeanella Craze 02/16/2021 5:15 PM

## 2021-02-17 LAB — TYPE AND SCREEN
ABO/RH(D): AB POS
Antibody Screen: POSITIVE
DAT, IgG: POSITIVE
Donor AG Type: NEGATIVE
Donor AG Type: NEGATIVE
Donor AG Type: NEGATIVE
Unit division: 0
Unit division: 0
Unit division: 0
Unit division: 0
Unit division: 0

## 2021-02-17 LAB — BPAM RBC
Blood Product Expiration Date: 202212062359
Blood Product Expiration Date: 202212082359
Blood Product Expiration Date: 202212102359
Blood Product Expiration Date: 202212142359
Blood Product Expiration Date: 202212152359
ISSUE DATE / TIME: 202211142111
ISSUE DATE / TIME: 202211150336
Unit Type and Rh: 6200
Unit Type and Rh: 6200
Unit Type and Rh: 6200
Unit Type and Rh: 6200
Unit Type and Rh: 6200

## 2021-02-18 ENCOUNTER — Telehealth: Payer: Self-pay | Admitting: *Deleted

## 2021-02-18 IMAGING — XA GI TUBE INJECTION
4 series · 14 of 24 positions shown · non-contrast
Comparison: Prior tube injection on 08/01/2018

INDICATION: Poor tolerance of gastrojejunal feeds. Indwelling balloon retention
gastrojejunal feeding tube.

EXAM:
ENTERIC TUBE INJECTION UNDER FLUOROSCOPY

[Series 1: single · 1 of 1 slices shown]
[im 1/1]
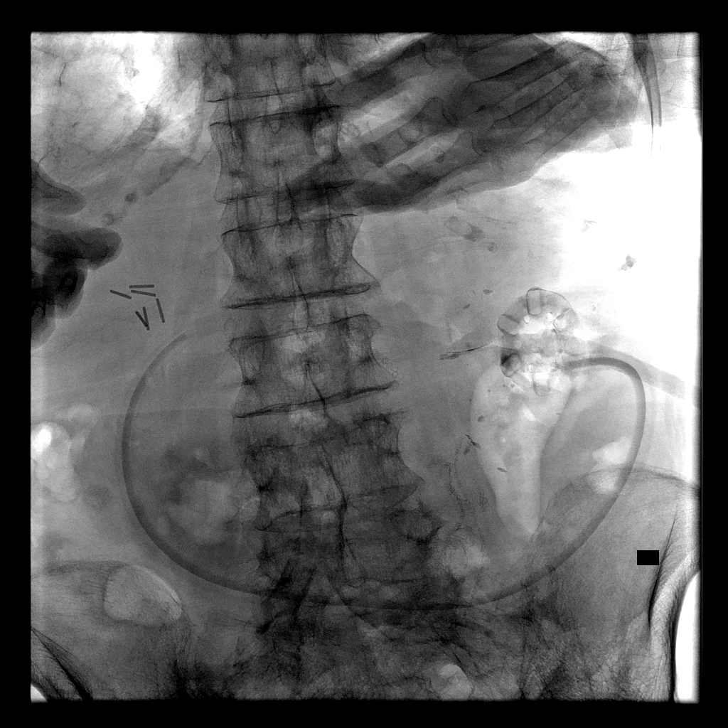

[Series 2: fl (-) angio · 2 of 6 slices shown (1 of 2)]
[im 2/6]
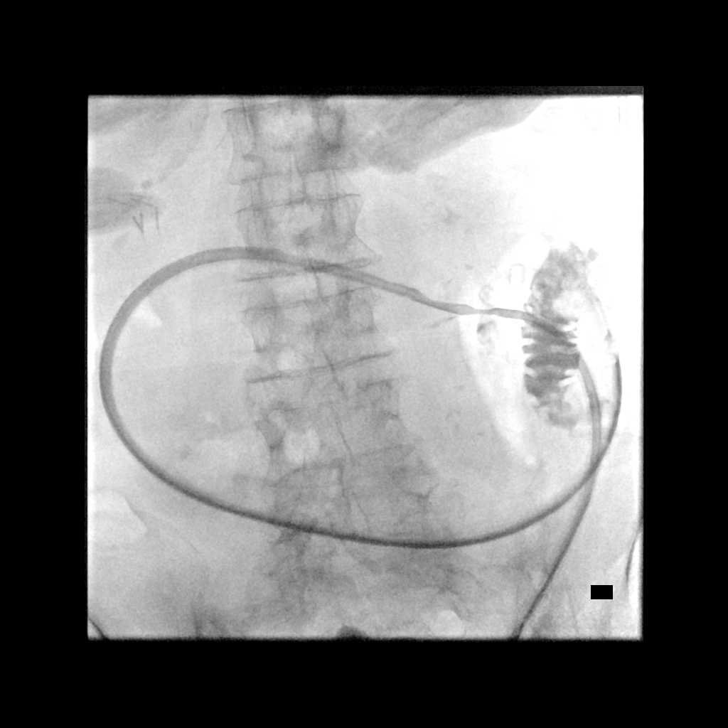
[im 6/6]
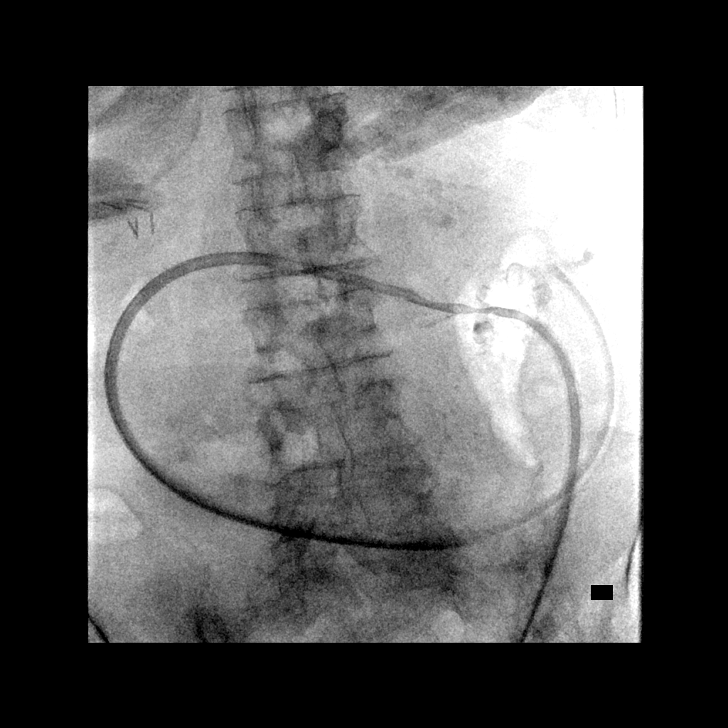

[Series 3: fl (-) angio · 2 of 6 slices shown (2 of 2)]
[im 2/6]
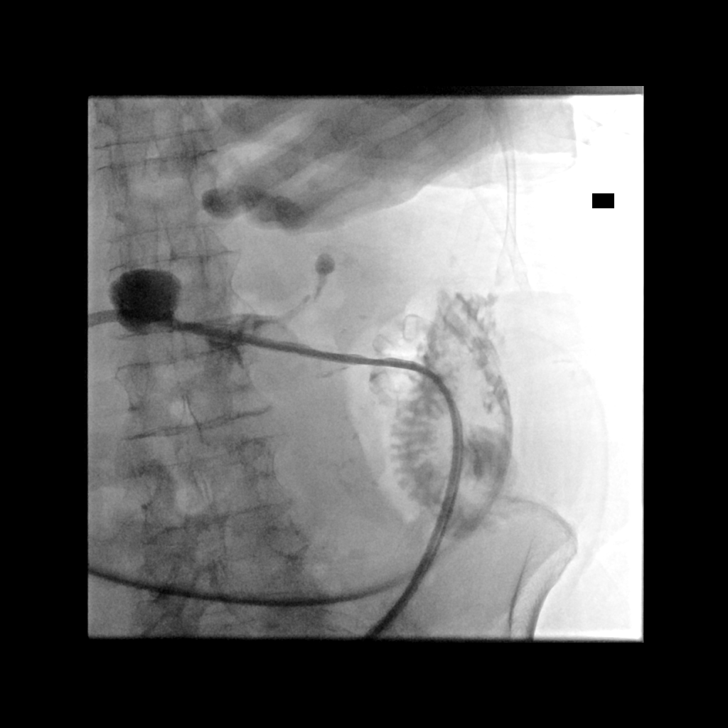
[im 4/6]
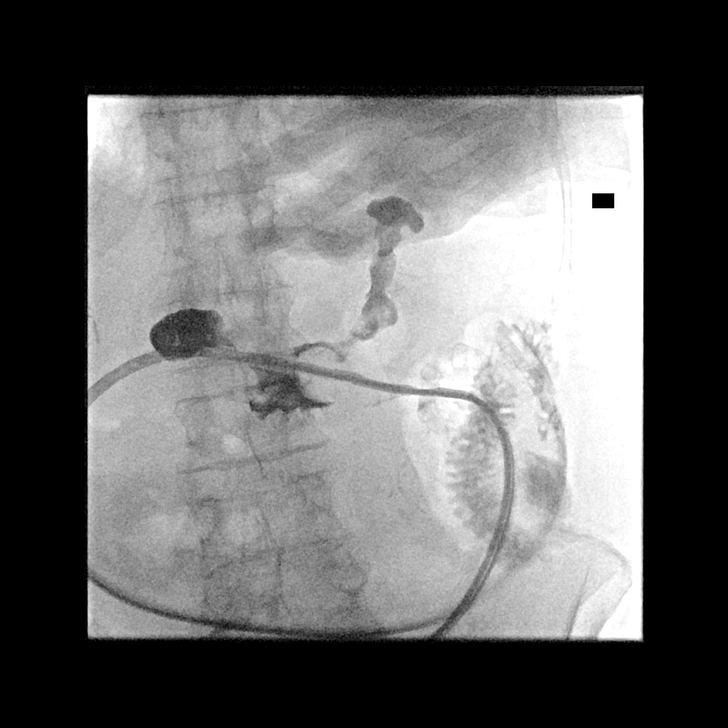

[Series 300: dsa body · 9 of 23 slices shown]
[im 1/23]
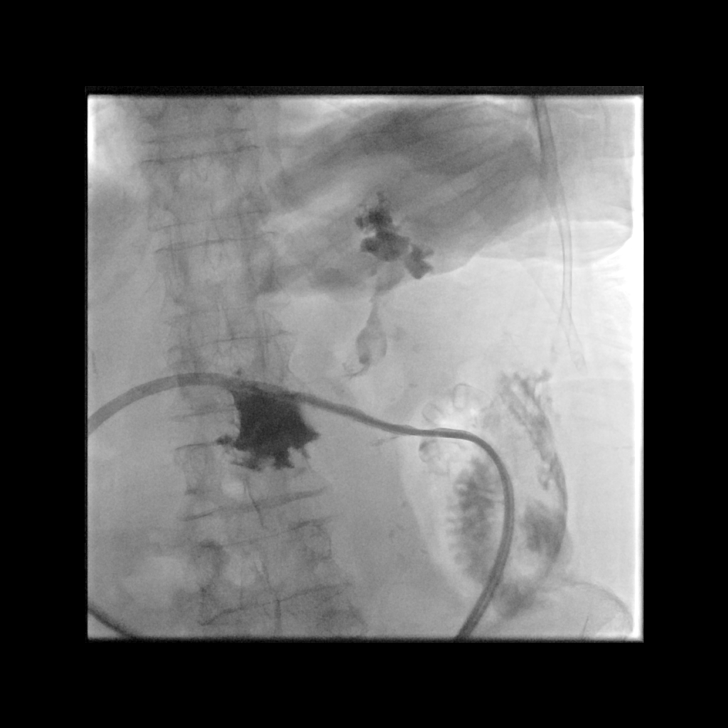
[im 4/23]
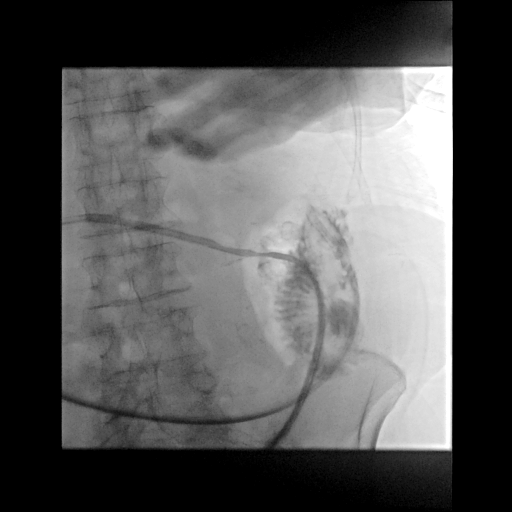
[im 5/23]
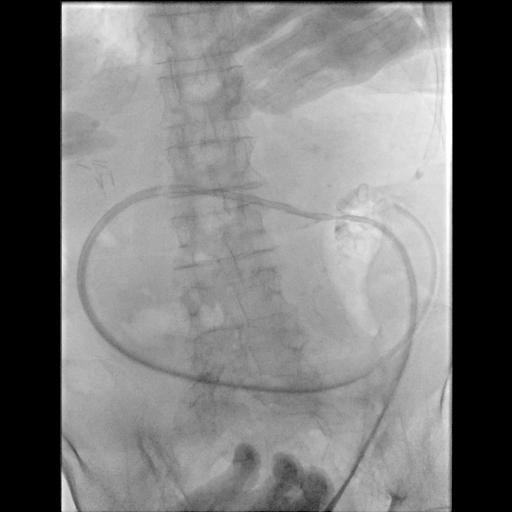
[im 8/23]
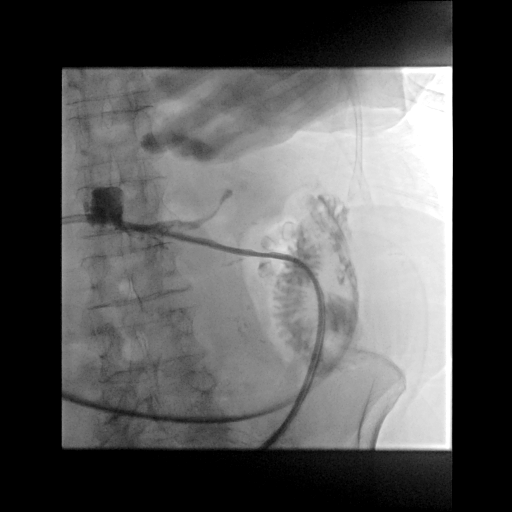
[im 12/23]
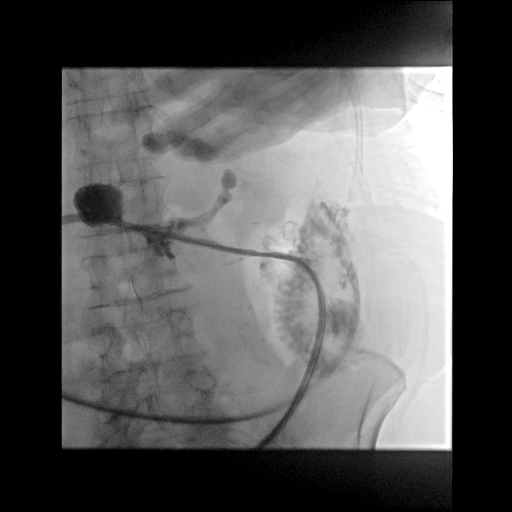
[im 15/23]
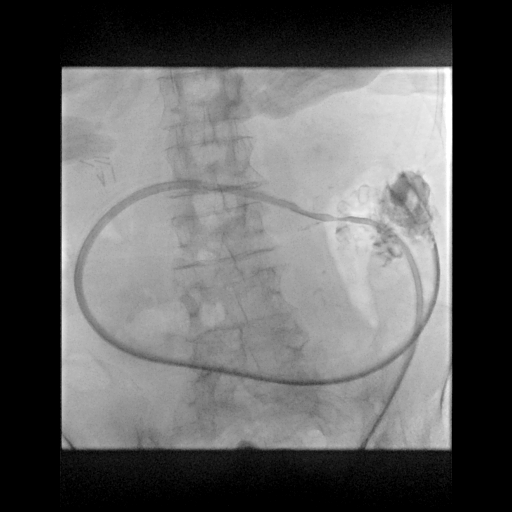
[im 16/23]
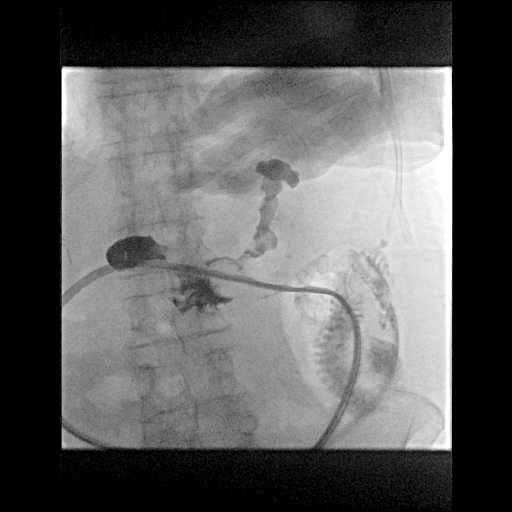
[im 19/23]
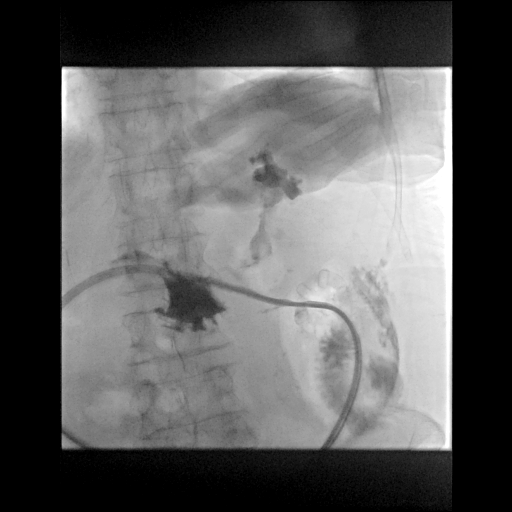
[im 23/23]
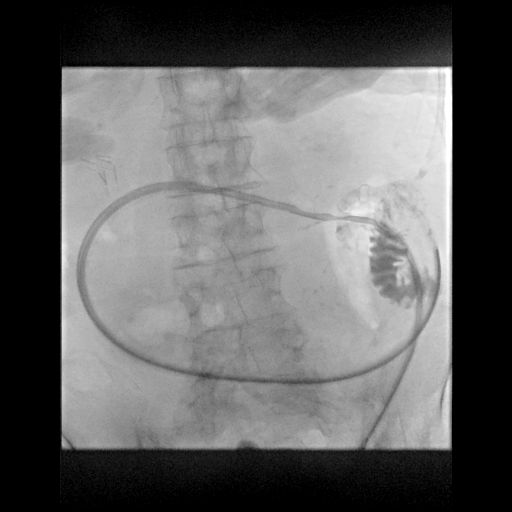

[14 of 24 positions shown; findings below may reference images not displayed]

MEDICATIONS:
None

ANESTHESIA/SEDATION:
None

CONTRAST:  10 mL Omnipaque 300-administered into the gastric lumen.

FLUOROSCOPY TIME:  Fluoroscopy Time: 42 seconds.  3.1 mGy.

COMPLICATIONS:
None immediate.

PROCEDURE:
Fluoroscopy demonstrates stable positioning of the gastrojejunal
tube with the tube extending through the stomach and duodenum and
the tube tip located in the jejunum. Initial injection of the
jejunal lumen demonstrates normal patency and no evidence of
contrast extravasation. Contrast freely flows out of the jejunal
lumen tip in the proximal jejunum. Injection of the gastric lumen
demonstrates normally patent lumen with contrast entering the
stomach.
IMPRESSION: Stable positioning and normal patency of gastrojejunal feeding tube.

## 2021-02-18 NOTE — Telephone Encounter (Signed)
Transition Care Management Unsuccessful Follow-up Telephone Call  Date of discharge and from where:  02/16/2021 [place]  Attempts:  1st Attempt  Reason for unsuccessful TCM follow-up call:  Left voice message

## 2021-02-21 ENCOUNTER — Other Ambulatory Visit: Payer: Self-pay | Admitting: *Deleted

## 2021-02-21 NOTE — Patient Outreach (Signed)
[facility]) Care Management  02/21/2021  Shannon [name] [birth year] [id number]   Outgoing call placed to member and daughter, successful.  Denies any urgent concerns, encouraged to contact this care manager with questions.  Agrees to follow up within the next month.  Care Plan : General Plan of Care (Adult)  Updates made by Kemper Durie, RN since 02/22/2021 12:00 AM     Problem: Difficulty managing chronic medical conditions (CHF, DM, HTN, PVD)   Priority: High     Long-Range Goal: Member and family will report adequate management of chronic health conditions (CHF, DM, HTN, PVD)   Start Date: 02/21/2021  Expected End Date: 08/21/2021  Priority: High  Note:   Current Barriers:  Chronic Disease Management support and education needs related to CHF, HTN, DMII, and PVD  RNCM Clinical Goal(s):  Patient will verbalize understanding of plan for management of CHF, HTN, DMII, and PVD as evidenced by member/family verbalizing stability in member's status take all medications exactly as prescribed and will call provider for medication related questions as evidenced by family reporting adherence    attend all scheduled medical appointments: PCP and Podiatry as evidenced by record of attendance        continue to work with RN Care Manager and/or Social Worker to address care management and care coordination needs related to CHF, HTN, DMII, and PVD as evidenced by adherence to CM Team Scheduled appointments     not experience hospital admission as evidenced by review of EMR. Hospital Admissions in last 6 months = 4  through collaboration with RN Care manager, provider, and care team.   Interventions: Inter-disciplinary care team collaboration (see longitudinal plan of care) Evaluation of current treatment plan related to  self management and patient's adherence to plan as established by provider   Heart Failure Interventions:  (Status: New goal.)  Long Term Goal  Basic  overview and discussion of pathophysiology of Heart Failure reviewed Provided education on low sodium diet Advised patient to weigh each morning after emptying bladder Discussed importance of daily weight and advised patient to weigh and record daily  Diabetes:  (Status: New goal.) Long Term Goal   Lab Results  Component Value Date   HGBA1C 5.5 11/04/2020  Assessed patient's understanding of A1c goal: <7% Discussed plans with patient for ongoing care management follow up and provided patient with direct contact information for care management team;      Provided patient with written educational materials related to hypo and hyperglycemia and importance of correct treatment;       Advised patient, providing education and rationale, to check cbg three times daily and record         PVD  (Status: New goal.) Long Term Goal  Evaluation of current treatment plan related to  PVD ,  self-management and patient's adherence to plan as established by provider. Discussed plans with patient for ongoing care management follow up and provided patient with direct contact information for care management team Provided education to patient re: wound care; Reviewed scheduled/upcoming provider appointments including podiatry; Discussed plans with patient for ongoing care management follow up and provided patient with direct contact information for care management team;  Hypertension: (Status: New goal.) Last practice recorded BP readings:  BP Readings from Last 3 Encounters:  02/16/21 (!) 169/66  01/25/21 (!) 151/66  01/06/21 130/80  Most recent eGFR/CrCl:  Lab Results  Component Value Date   EGFR 36 (L) 01/06/2021    No components found for: CRCL  Provided education to patient re: stroke prevention, s/s of heart attack and stroke; Reviewed prescribed diet low salt Discussed plans with patient for ongoing care management follow up and provided patient with direct contact information for care  management team; Advised patient, providing education and rationale, to monitor blood pressure daily and record, calling PCP for findings outside established parameters;   Patient Goals/Self-Care Activities: Take medications as prescribed   Attend all scheduled provider appointments Call provider office for new concerns or questions  watch for swelling in feet, ankles and legs every day weigh myself daily track symptoms and what helps feel better or worse check blood sugar at prescribed times: three times daily check feet daily for cuts, sores or redness enter blood sugar readings and medication or insulin into daily log take the blood sugar meter to all doctor visits   Update 11/22 - Alone with above listed conditions, member also has had admissions for anemia from GI bleed, recently discharged on 11/16.  Medications changed (Plavix stopped and Carafate started).  She will follow up with PCP on 12/7. Also continues to have wounds on feet post transmetatarsal amputations, follow up with podiatry on 12/5.  She is working to regain her strength, does not feel PT is needed.   Per daughter, she feel she could use more help now as member's condition has steadily declined over the last several months.  Will send list of agencies for in home aide assistance, daughter aware there is an out of pocket fee as member does not have Medicaid.      Kemper Durie, [place], MSN [facility] Care Management  [facility] Manager [phone]

## 2021-02-22 NOTE — Telephone Encounter (Signed)
Transition Care Management Unsuccessful Follow-up Telephone Call  Date of discharge and from where:  02/16/2021 [place]  Attempts:  2nd Attempt  Reason for unsuccessful TCM follow-up call:  Left voice message

## 2021-02-23 ENCOUNTER — Ambulatory Visit: Payer: Medicare Other | Admitting: *Deleted

## 2021-02-25 ENCOUNTER — Ambulatory Visit (HOSPITAL_COMMUNITY)
Admission: RE | Admit: 2021-02-25 | Discharge: 2021-02-25 | Disposition: A | Discharge status: Home / Self Care | Payer: Medicare Other | Source: Ambulatory Visit | Attending: Interventional Radiology | Admitting: Interventional Radiology

## 2021-02-25 ENCOUNTER — Other Ambulatory Visit: Payer: Self-pay

## 2021-02-25 ENCOUNTER — Other Ambulatory Visit (HOSPITAL_COMMUNITY): Payer: Self-pay | Admitting: Interventional Radiology

## 2021-02-25 DIAGNOSIS — R633 Feeding difficulties, unspecified: Secondary | ICD-10-CM | POA: Diagnosis not present

## 2021-02-25 DIAGNOSIS — K9413 Enterostomy malfunction: Secondary | ICD-10-CM | POA: Diagnosis not present

## 2021-02-25 DIAGNOSIS — Z431 Encounter for attention to gastrostomy: Secondary | ICD-10-CM | POA: Insufficient documentation

## 2021-02-25 HISTORY — PX: IR GJ TUBE CHANGE: IMG1440

## 2021-02-25 NOTE — Progress Notes (Signed)
Vascular and Interventional Radiology  PRE PROCEDURE NOTE  Assessment  Plan:   [name] is a 87 y.o. year old female who will undergo GASTROJEJUNOSTOMY TUBE REPLACEMENT in Interventional Radiology.  Secondary to supply, we will place a smaller bore jejunal portion of the tube. Pt is to return to VIR for exchange to a large bore G-J once supply is back.  The procedure has been fully reviewed with the patient/patient's authorized representative. The risks, benefits and alternatives have been explained, and the patient/patient's authorized representative has consented to the procedure.   HPI: [name] is a 87 y.o. year old female comorbid with PMHx significant for prior DVT, GIB, PVD and pyloric stenosis s/p G-J placement  in 2017. Pt's Daughter reports that her tube cracked and succus was leaking on her clothes and person. They report to only come in when the catheter malfunctions. Last exchange was 10/19/20.  Informed consent was obtained, witnessed and placed in the patient's chart.  Allergies:  Allergies  Allergen Reactions   Penicillins Itching, Rash and Other (See Comments)    Tolerated amoxicillin, unasyn, zosyn & cephalosporins in the past. Did it involve swelling of the face/tongue/throat, SOB, or low BP? No Did it involve sudden or severe rash/hives, skin peeling, or any reaction on the inside of your mouth or nose? No Did you need to seek medical attention at a hospital or doctor's office? Unknown When did it last happen?      5+ years If all above answers are "NO", may proceed with cephalosporin use.     Medications:  Current Outpatient Medications on File Prior to Encounter  Medication Sig Dispense Refill   acetaminophen (TYLENOL) 500 MG tablet Take 500 mg by mouth every 6 (six) hours as needed for moderate pain.     amLODipine (NORVASC) 5 MG tablet TAKE 1 TABLET BY MOUTH EVERY DAY (Patient taking differently: Take 5 mg by mouth daily.) 90 tablet 1    aspirin 81 MG EC tablet Take 1 tablet (81 mg total) by mouth daily. Swallow whole. 150 tablet 2   atorvastatin (LIPITOR) 20 MG tablet Take 0.5 tablets (10 mg total) by mouth daily. 30 tablet 3   b complex vitamins tablet Take 1 tablet by mouth daily.      cholecalciferol (VITAMIN D3) 25 MCG (1000 UNIT) tablet Take 1,000 Units by mouth daily.     cycloSPORINE (RESTASIS) 0.05 % ophthalmic emulsion USE 1 DROP INTO BOTH EYES TWICE DAILY (Patient taking differently: Place 1 drop into both eyes 2 (two) times daily.) 60 mL 3   DULoxetine (CYMBALTA) 30 MG capsule TAKE 1 CAPSULE BY MOUTH EVERY DAY (Patient taking differently: Take 30 mg by mouth daily.) 90 capsule 2   ferrous sulfate 220 (44 Fe) MG/5ML solution TAKE 7.4 MLS (325 MG TOTAL) BY MOUTH DAILY. (Patient taking differently: Place 220 mg into feeding tube daily.) 150 mL 11   fluticasone (FLONASE) 50 MCG/ACT nasal spray 2 sprays into each nostril daily. (Patient taking differently: Place 2 sprays into both nostrils daily.) 48 mL 2   furosemide (LASIX) 40 MG tablet TAKE 0.5-1 TABLETS (20-40 MG TOTAL) BY MOUTH SEE ADMIN INSTRUCTIONS. TAKE 20 MG DAILY, MAY INSTEAD TAKE 40 MG DAILY AS NEEDED FOR EDEMA 30 tablet 8   gabapentin (NEURONTIN) 300 MG capsule TAKE 1 CAPSULE BY MOUTH TWICE A DAY (Patient taking differently: Take 300 mg by mouth 2 (two) times daily.) 180 capsule 1   gentamicin cream (GARAMYCIN) 0.1 % Apply 1  application topically daily as needed (dry skin). 30 g 1   hydrALAZINE (APRESOLINE) 50 MG tablet Take 1 tablet (50 mg total) by mouth 3 (three) times daily. 270 tablet 0   Insulin Pen Needle (B-D UF III MINI PEN NEEDLES) 31G X 5 MM MISC Use twice daily with giving insulin injections. Dx E11.9 300 each 3   latanoprost (XALATAN) 0.005 % ophthalmic solution Place 1 drop into both eyes at bedtime.     levothyroxine (SYNTHROID) 125 MCG tablet TAKE 1 TABLET BY MOUTH EVERY DAY (Patient taking differently: Take 125 mcg by mouth daily before breakfast.)  90 tablet 3   liver oil-zinc oxide (DESITIN) 40 % ointment Apply topically 2 (two) times daily. 56.7 g 0   loratadine (CLARITIN) 10 MG tablet Take 10 mg by mouth daily.     mometasone-formoterol (DULERA) 200-5 MCG/ACT AERO INHALE TWO PUFFS BY MOUTH TWICE DAILY (Patient taking differently: Inhale 2 puffs into the lungs 2 (two) times daily.) 13 each 5   Multiple Vitamin (MULTIVITAMIN WITH MINERALS) TABS tablet Take 1 tablet by mouth daily.     Nutritional Supplements (FEEDING SUPPLEMENT, OSMOLITE 1.5 CAL,) LIQD Place 711 mLs into feeding tube See admin instructions. Take 711 ml over a 12 hour period per day 9pm-9am at a rate of 65     nystatin cream (MYCOSTATIN) APPLY TO AFFECTED AREA TWICE A DAY (Patient taking differently: Apply 1 application topically 2 (two) times daily as needed (feeding tube site irritation).) 30 g 11   oxyCODONE (ROXICODONE) 5 MG immediate release tablet Take 0.5-1 tablets (2.5-5 mg total) by mouth every 8 (eight) hours as needed for severe pain. 30 tablet 0   pantoprazole (PROTONIX) 40 MG tablet Take 1 tablet (40 mg total) by mouth 2 (two) times daily. 180 tablet 2   Polyethyl Glycol-Propyl Glycol (SYSTANE) 0.4-0.3 % GEL ophthalmic gel Place 1 application into both eyes 2 (two) times daily as needed (moisture).     Probiotic Product (EQL PROBIOTIC COLON SUPPORT PO) Take 1 capsule by mouth daily.     senna-docusate (SENOKOT-S) 8.6-50 MG tablet Take 1 tablet by mouth at bedtime.     Sodium Chloride-Sodium Bicarb (NETI POT SINUS Loop NA) Place 1 application into the nose at bedtime.     sucralfate (CARAFATE) 1 GM/10ML suspension Take 10 mLs (1 g total) by mouth every 6 (six) hours. 420 mL 0   traMADol (ULTRAM) 50 MG tablet Place 1 tablet (50 mg total) into feeding tube every 6 (six) hours as needed for severe pain. (Patient not taking: Reported on 02/15/2021) 30 tablet 0   Water For Irrigation, Sterile (FREE WATER) SOLN Place 180 mLs into feeding tube 3 (three) times daily.      White Petrolatum-Mineral Oil (SYSTANE NIGHTTIME) OINT Place 1 application into both eyes at bedtime.      No current facility-administered medications on file prior to encounter.    PSH:  Past Surgical History:  Procedure Laterality Date   ABDOMINAL AORTOGRAM N/A 09/21/2016   Procedure: Abdominal Aortogram;  Surgeon: Waynetta Sandy, MD;  Location: [place] [facility];  Service: Cardiovascular;  Laterality: N/A;   ABDOMINAL AORTOGRAM W/LOWER EXTREMITY N/A 10/20/2020   Procedure: ABDOMINAL AORTOGRAM W/LOWER EXTREMITY;  Surgeon: Cherre Robins, MD;  Location: [place] [facility];  Service: Cardiovascular;  Laterality: N/A;   AMPUTATION Left 09/25/2016   Procedure: AMPUTATION DIGIT- LEFT 2ND AND 3RD TOES;  Surgeon: Rosetta Posner, MD;  Location: [place];  Service: Vascular;  Laterality: Left;  AMPUTATION Right 05/03/2018   Procedure: AMPUTATION RIGHT GREAT TOE;  Surgeon: Rosetta Posner, MD;  [facility];  Service: Vascular;  Laterality: Right;   CATARACT EXTRACTION W/ INTRAOCULAR LENS  IMPLANT, BILATERAL     CHOLECYSTECTOMY OPEN     COLECTOMY     ESOPHAGOGASTRODUODENOSCOPY N/A 01/19/2014   Procedure: ESOPHAGOGASTRODUODENOSCOPY (EGD);  Surgeon: Irene Shipper, MD;  Location[facility];  Service: Endoscopy;  Laterality: N/A;   ESOPHAGOGASTRODUODENOSCOPY N/A 01/20/2014   Procedure: ESOPHAGOGASTRODUODENOSCOPY (EGD);  Surgeon: Irene Shipper, MD;  Location: [facility];  Service: Endoscopy;  Laterality: N/A;   ESOPHAGOGASTRODUODENOSCOPY N/A 03/19/2014   Procedure: ESOPHAGOGASTRODUODENOSCOPY (EGD);  Surgeon: Milus Banister, MD;  Location: [facility];  Service: Endoscopy;  Laterality: N/A;   ESOPHAGOGASTRODUODENOSCOPY N/A 07/08/2015   Procedure: ESOPHAGOGASTRODUODENOSCOPY (EGD);  Surgeon: Doran Stabler, MD;  Location: [facility];  Service: Endoscopy;  Laterality: N/A;   ESOPHAGOGASTRODUODENOSCOPY (EGD) WITH PROPOFOL N/A 09/15/2015   Procedure: ESOPHAGOGASTRODUODENOSCOPY (EGD)  WITH PROPOFOL;  Surgeon: Manus Gunning, MD;  [facility]: [facility];  Service: Gastroenterology;  Laterality: N/A;   ESOPHAGOGASTRODUODENOSCOPY (EGD) WITH PROPOFOL N/A 10/12/2020   Procedure: ESOPHAGOGASTRODUODENOSCOPY (EGD) WITH PROPOFOL;  Surgeon: Gatha Mayer, MD;  Location: [place];  Service: Endoscopy;  Laterality: N/A;   FOOT SURGERY     GASTROJEJUNOSTOMY     hx/notes 01/10/2015   GASTROJEJUNOSTOMY N/A 09/23/2015   Procedure: OPEN GASTROJEJUNOSTOMY TUBE PLACEMENT;  Surgeon: Arta Bruce Kinsinger, MD;  Location: [facility];  Service: General;  Laterality: N/A;   GLAUCOMA SURGERY Bilateral    HEMATOMA EVACUATION Right 10/20/2020   Procedure: EVACUATION HEMATOMA RIGHT GROIN;  Surgeon: Cherre Robins, MD;  Location: [place];  Service: Vascular;  Laterality: Right;   IR CM INJ ANY COLONIC TUBE W/FLUORO  01/05/2017   IR CM INJ ANY COLONIC TUBE W/FLUORO  06/07/2017   IR CM INJ ANY COLONIC TUBE W/FLUORO  11/05/2017   IR CM INJ ANY COLONIC TUBE W/FLUORO  09/18/2018   IR CM INJ ANY COLONIC TUBE W/FLUORO  03/06/2019   IR CM INJ ANY COLONIC TUBE W/FLUORO  03/11/2019   IR GASTR TUBE CONVERT GASTR-JEJ PER W/FL MOD SED  04/30/2020   IR GENERIC HISTORICAL  01/07/2016   IR [place] TUBE CHANGE 01/07/2016 Jacqulynn Cadet, MD [facility]   IR GENERIC HISTORICAL  01/27/2016   IR MECH REMOV OBSTRUC MAT ANY COLON TUBE W/FLUORO 01/27/2016 Markus Daft, MD [facility]   IR GENERIC HISTORICAL  02/07/2016   IR PATIENT EVAL TECH 0-60 MINS Darrell K Allred, PA-C [facility]   IR GENERIC HISTORICAL  02/08/2016   IR [place] TUBE CHANGE 02/08/2016 Greggory Keen, MD [facility]   IR GENERIC HISTORICAL  01/06/2016   IR [place] TUBE CHANGE 01/06/2016 [facility] OUT REF   IR GENERIC HISTORICAL  05/02/2016   IR CM INJ ANY COLONIC TUBE W/FLUORO 05/02/2016 Arne Cleveland, MD [facility]   IR GENERIC HISTORICAL  05/15/2016   IR [place] TUBE CHANGE 05/15/2016 Sandi Mariscal, MD [facility]   IR GENERIC HISTORICAL  06/28/2016    IR [place] TUBE CHANGE 06/28/2016 [facility]   IR [place] TUBE CHANGE  02/20/2017   IR [place] TUBE CHANGE  05/10/2017   IR [place] TUBE CHANGE  07/06/2017   IR [place] TUBE CHANGE  08/02/2017   IR [place] TUBE CHANGE  10/15/2017   IR [place] TUBE CHANGE  12/26/2017   IR [place] TUBE CHANGE  04/08/2018   IR [place] TUBE CHANGE  06/19/2018   IR [place]  TUBE CHANGE  03/03/2019   IR GJ TUBE CHANGE  06/06/2019   IR GJ TUBE CHANGE  09/26/2019   IR GJ TUBE CHANGE  01/09/2020   IR GJ TUBE CHANGE  05/12/2020   IR GJ TUBE CHANGE  10/05/2020   IR GJ TUBE CHANGE  10/14/2020   IR GJ TUBE CHANGE  10/19/2020   IR PATIENT EVAL TECH 0-60 MINS  10/19/2016   IR PATIENT EVAL TECH 0-60 MINS  12/25/2016   IR PATIENT EVAL TECH 0-60 MINS  01/29/2017   IR PATIENT EVAL TECH 0-60 MINS  04/04/2017   IR PATIENT EVAL TECH 0-60 MINS  04/30/2017   IR PATIENT EVAL TECH 0-60 MINS  07/31/2017   IR REPLC DUODEN/JEJUNO TUBE PERCUT W/FLUORO  11/14/2016   IRRIGATION AND DEBRIDEMENT FOOT Bilateral 12/26/2020   Procedure: IRRIGATION AND DEBRIDEMENT FOOT AND BONE BIOPSY;  Surgeon: Trula Slade, DPM;  Location: [place];  Service: Podiatry;  Laterality: Bilateral;   LAPAROTOMY N/A 01/20/2015   Procedure: EXPLORATORY LAPAROTOMY;  Surgeon: Coralie Keens, MD;  Location: [place];  Service: General;  Laterality: N/A;   LOWER EXTREMITY ANGIOGRAPHY Left 09/21/2016   Procedure: Lower Extremity Angiography;  Surgeon: Waynetta Sandy, MD;  Location: [place] [facility];  Service: Cardiovascular;  Laterality: Left;   LOWER EXTREMITY ANGIOGRAPHY Right 06/27/2017   Procedure: Lower Extremity Angiography;  Surgeon: Waynetta Sandy, MD;  Location: [place] [facility];  Service: Cardiovascular;  Laterality: Right;   LOWER EXTREMITY ANGIOGRAPHY Right 12/24/2020   Procedure: LOWER EXTREMITY ANGIOGRAPHY;  Surgeon: Cherre Robins, MD;  Location: [place] [facility];  Service: Cardiovascular;  Laterality: Right;   LYSIS OF ADHESION N/A 01/20/2015   Procedure: LYSIS OF  ADHESIONS < 1 HOUR;  Surgeon: Coralie Keens, MD;  Location: [place];  Service: General;  Laterality: N/A;   PERIPHERAL VASCULAR BALLOON ANGIOPLASTY Left 09/21/2016   Procedure: Peripheral Vascular Balloon Angioplasty;  Surgeon: Waynetta Sandy, MD;  Location: [place] [facility];  Service: Cardiovascular;  Laterality: Left;  drug coated balloon   PERIPHERAL VASCULAR BALLOON ANGIOPLASTY Right 06/27/2017   Procedure: PERIPHERAL VASCULAR BALLOON ANGIOPLASTY;  Surgeon: Waynetta Sandy, MD;  Location: [place] [facility];  Service: Cardiovascular;  Laterality: Right;  SFA/[id number]   PERIPHERAL VASCULAR INTERVENTION Left 10/20/2020   Procedure: PERIPHERAL VASCULAR INTERVENTION;  Surgeon: Cherre Robins, MD;  Location: [place] [facility];  Service: Cardiovascular;  Laterality: Left;   TONSILLECTOMY     TRANSMETATARSAL AMPUTATION Right 11/14/2019   Procedure: TRANSMETATARSAL AMPUTATION RIGHT FOOT;  Surgeon: Edrick Kins, DPM;  Location: [place];  Service: Podiatry;  Laterality: Right;   TUBAL LIGATION     VENTRAL HERNIA REPAIR  2015   incarcerated ventral hernia ([facility] 09/2013)/notes 01/10/2015   WOUND EXPLORATION Right 10/20/2020   Procedure: PRIMARY CLOSURE COMMON FEMORAL ARTERY;  Surgeon: Cherre Robins, MD;  Location: [place];  Service: Vascular;  Laterality: Right;    PMH:  Past Medical History:  Diagnosis Date   Acute respiratory failure ([place])    Anemia    previous blood transfusions   Arthritis    "all over"   Asthma    Atrial flutter (HCC)    Bradycardia    requiring previous d/c of BB and reduction of amiodarone   CAD (coronary artery disease)    nonobstructive per notes   Chronic diastolic CHF (congestive heart failure) ([place])    CKD (chronic kidney disease), stage III ([place])    Complication of blood transfusion    "  got the wrong blood type at [facility] in ~ 2015; no adverse reaction that we are aware of"/daughter, [name] (01/27/2016)   COPD (chronic obstructive  pulmonary disease) ([place])    Depression    "light case"   DVT (deep venous thrombosis) ([place]) 01/2016   a. LLE DVT 01/2016 - switched from Eliquis to Coumadin.   Dyspnea    with some activity   Gastric stenosis    a. s/p stomach tube   GERD (gastroesophageal reflux disease)    Headache    History of 2019 novel coronavirus disease (COVID-19)    History of blood transfusion    "several" (01/27/2016)   History of stomach ulcers    Hyperlipidemia    Hypertension    Hypothyroidism    On home oxygen therapy    "2L; 9PM til 9AM" (06/27/2017)   PAD (peripheral artery disease) (H[facility])    a. prior gangrene, toe amputations, intervention.   PAF (paroxysmal atrial fibrillation) ([place])    Paraesophageal hernia    Perforated gastric ulcer ([place])    Pneumonia    "a few times" (06/27/2017)   Seasonal allergies    SIADH (syndrome of inappropriate ADH production) ([place])    [name] 01/10/2015   Small bowel obstruction ([place])    "I don't know how many" (01/11/2015)   Stroke ([place])    several- no residual   Type II diabetes mellitus ([place])    "related to prednisone use  for > 20 yr; once predinose stopped; no more DM RX" (01/27/2016)   UTI (urinary tract infection) 02/08/2016   Ventral hernia with bowel obstruction     Brief Physical Examination: There were no vitals filed for this visit. General: WD, elderly female in NAD HEENT: Normocephalic, atraumatic Lungs: Respirations non-labored   Michaelle Birks, MD Vascular and Interventional Radiology Specialists [facility] Radiology   Pager. [place]

## 2021-02-25 NOTE — Procedures (Signed)
Vascular and Interventional Radiology Procedure Note  Patient: Shannon [name] DOB: 1923/09/19 Medical Record Number: [id number] Note Date/Time: 02/25/21 6:29 PM   Performing Physician: Michaelle Birks, MD Assistant(s): None  Diagnosis: Catheter malfunction. and Leaking catheter.  Procedure:  GASTROJEJUNOSTOMY TUBE EXCHANGE  Anesthesia: Local Anesthetic Complications: None Estimated Blood Loss:  0 mL  Findings:  Successful exchange of a 35F gastrojejunostomy tube under fluoroscopy.   Plan:  Secondary to supply, we placed a smaller bore jejunal portion of the tube. Pt is to return to VIR for exchange to a large bore G-J once supply is back.  See detailed procedure note with images in PACS. The patient tolerated the procedure well without incident or complication and was returned to [facility] in stable condition.    Michaelle Birks, MD Vascular and Interventional Radiology Specialists [facility] Radiology   Pager. [place]

## 2021-02-26 IMAGING — US ULTRASOUND ABDOMEN COMPLETE
1 series · 14 of 25 positions shown · non-contrast
Comparison: None.

CLINICAL DATA: Abdomen pain for 4 months.

EXAM:
ABDOMEN ULTRASOUND COMPLETE

[Series 1: ultrasound abdomen complete · 0.25mm/px · 14 of 80 slices shown]
[im 1/80]
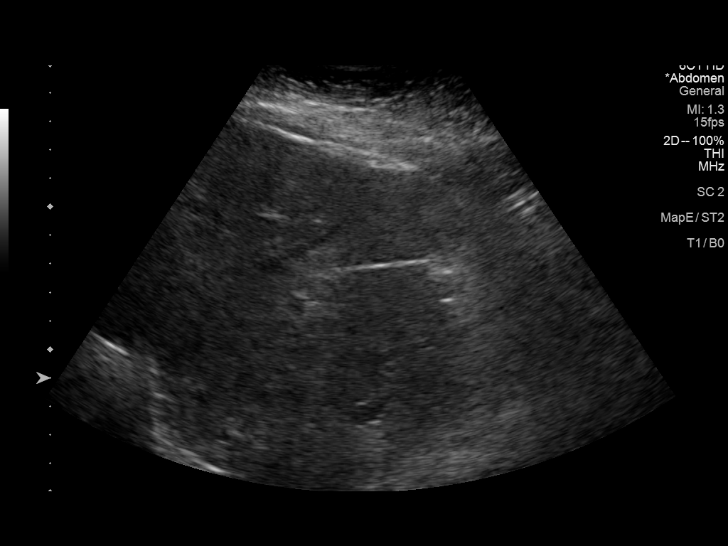
[im 7/80]
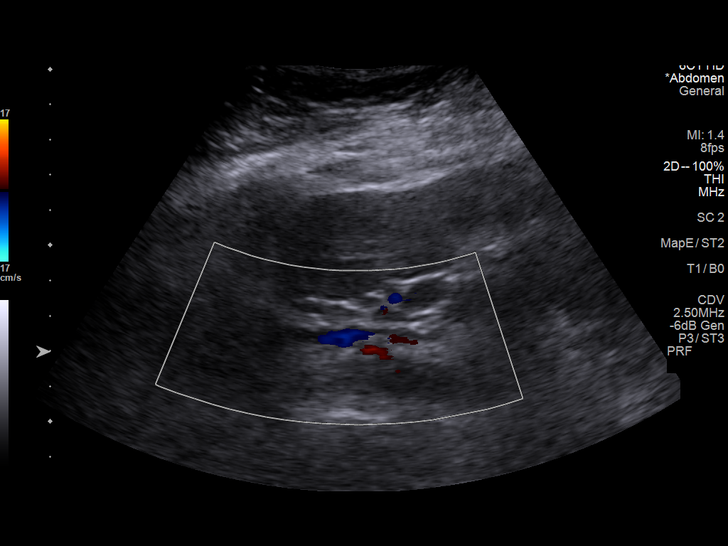
[im 14/80]
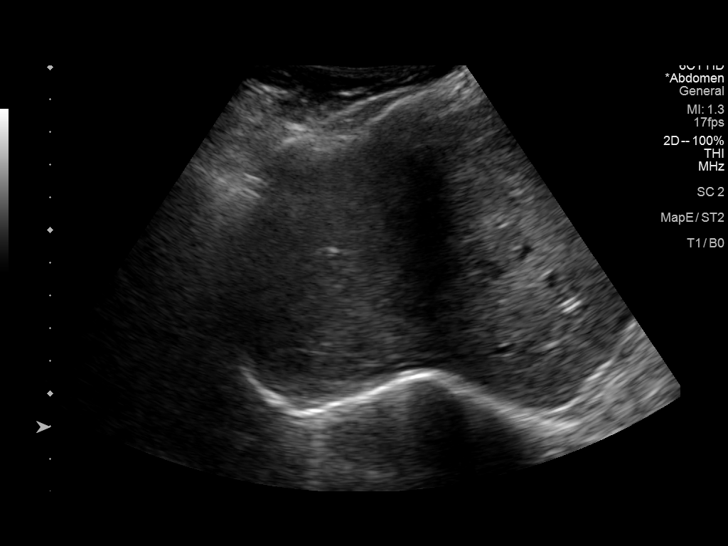
[im 20/80]
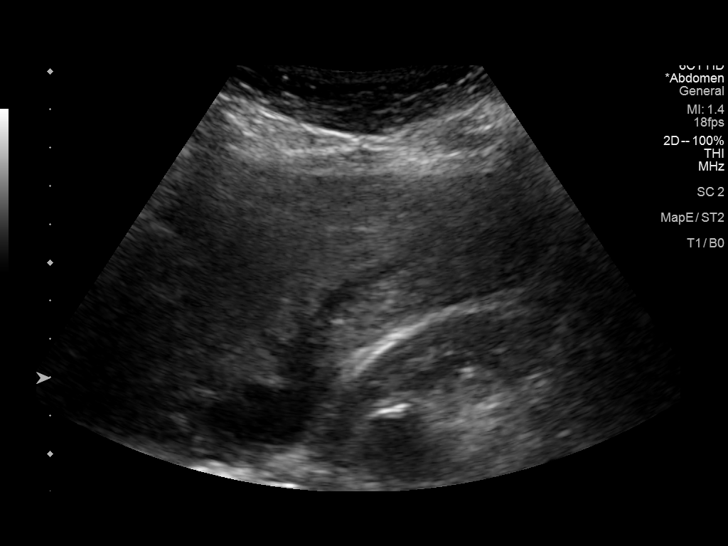
[im 27/80]
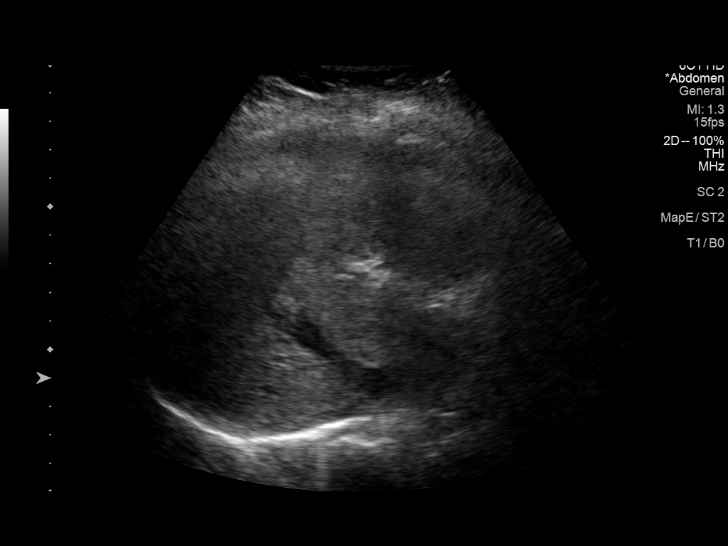
[im 30/80]
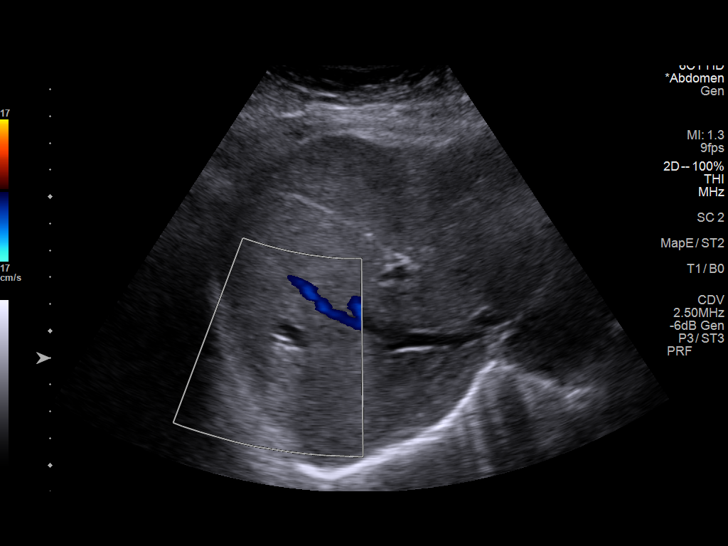
[im 37/80]
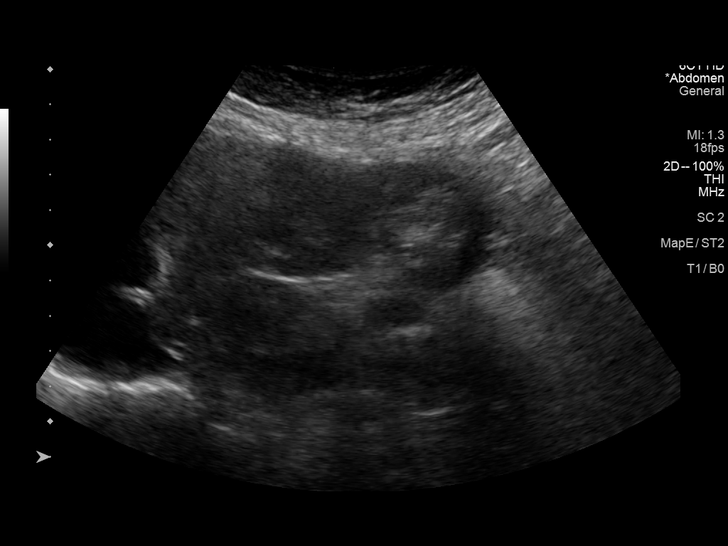
[im 43/80]
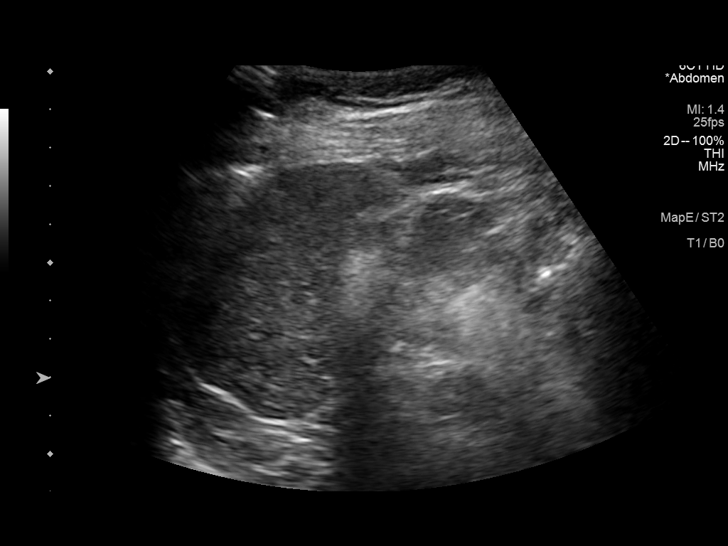
[im 50/80]
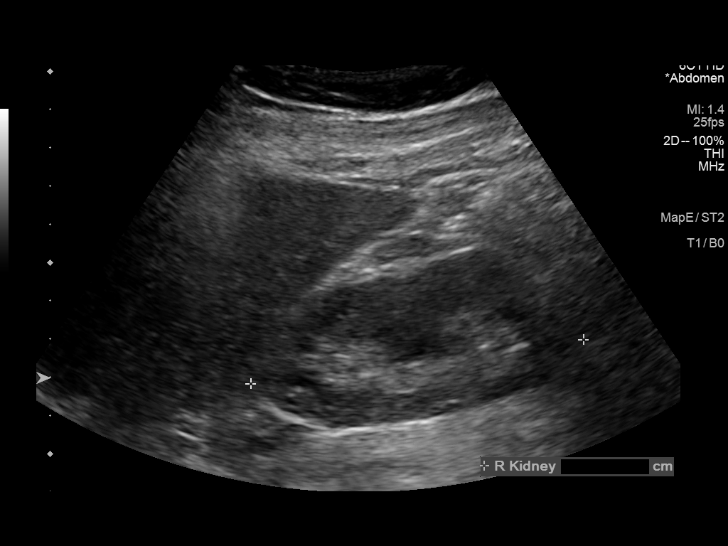
[im 53/80]
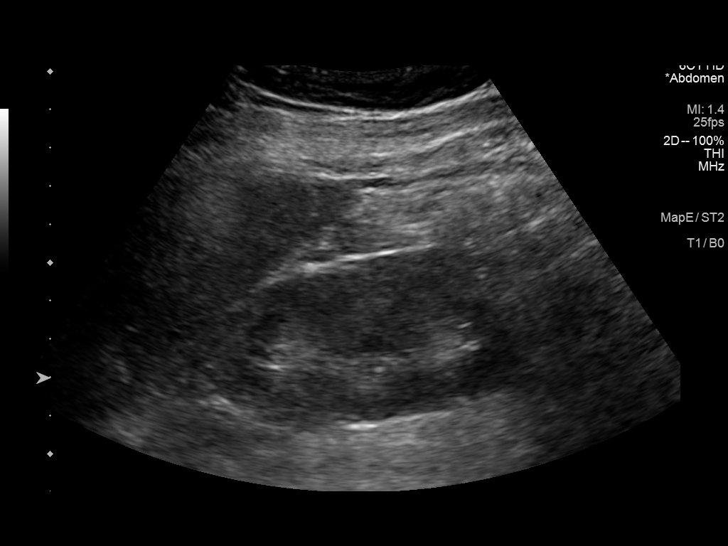
[im 60/80]
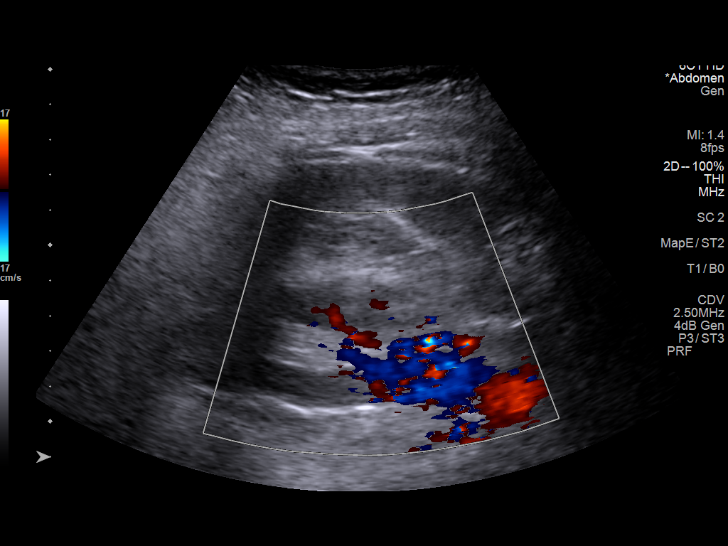
[im 66/80]
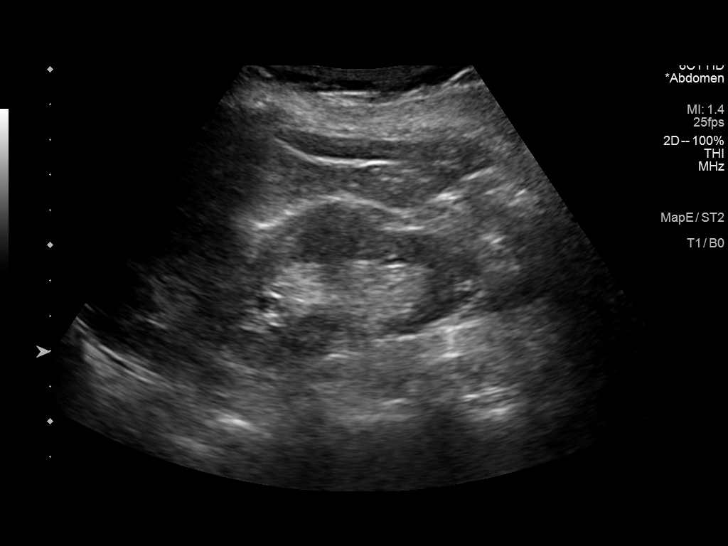
[im 73/80]
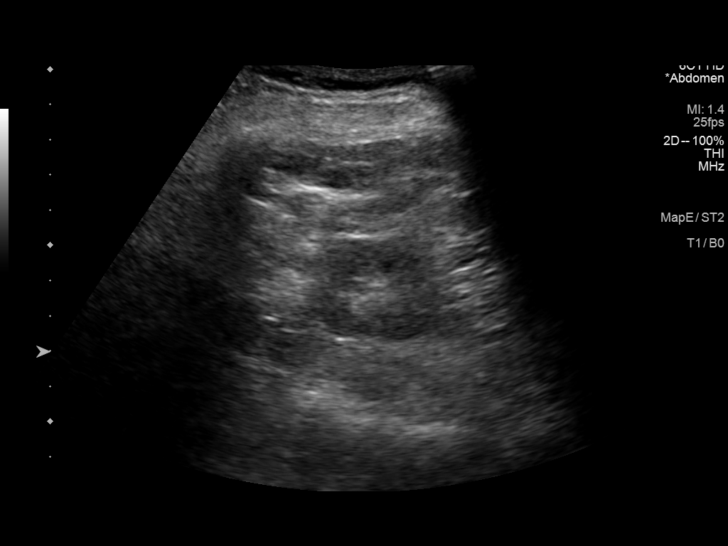
[im 80/80]
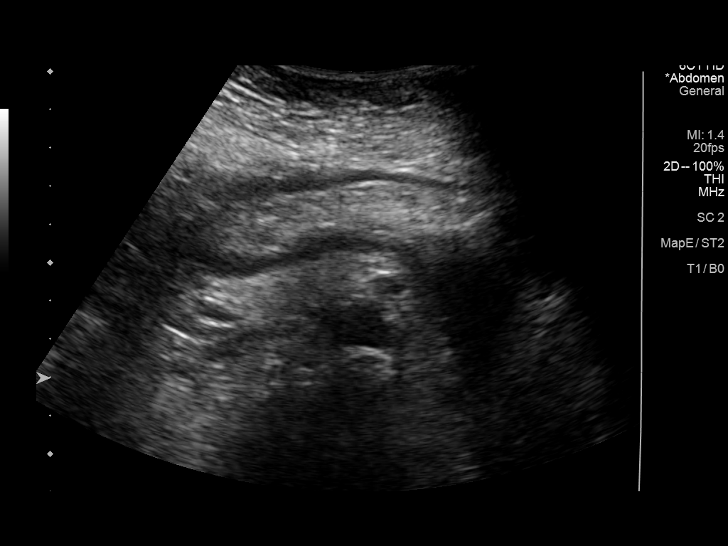

[14 of 25 positions shown; findings below may reference images not displayed]

FINDINGS: Gallbladder: Surgically absent.

Common bile duct: Diameter: 5 mm

Liver: Mild heterogeneous increased echotexture of the liver is
identified. In the right lobe liver, there is probably a complex
cyst measuring 1.7 x 1.1 x 1.7 cm. Portal vein is patent on color
Doppler imaging with normal direction of blood flow towards the
liver.

IVC: No abnormality visualized.

Pancreas: Limited evaluation.

Spleen: Size and appearance within normal limits.

Right Kidney: Length: 8.8 cm. Echogenicity within normal limits. No
mass or hydronephrosis visualized.

Left Kidney: Length: 8.3 cm. Increased echotexture of the kidney is
noted. No mass or hydronephrosis visualized.

Abdominal aorta: No aneurysm visualized.  Mild atherosclerosis.

Other findings: None.
IMPRESSION: No acute abnormality identified.

1.7 cm probable complex cyst in the right lobe liver.

Increased echotexture of the left kidney. Nonspecific but can be
seen in medical renal disease.

## 2021-03-07 ENCOUNTER — Other Ambulatory Visit: Payer: Medicare Other

## 2021-03-07 ENCOUNTER — Other Ambulatory Visit: Payer: Self-pay

## 2021-03-07 ENCOUNTER — Ambulatory Visit: Payer: Medicare Other

## 2021-03-07 ENCOUNTER — Ambulatory Visit (INDEPENDENT_AMBULATORY_CARE_PROVIDER_SITE_OTHER): Payer: Medicare Other | Admitting: Podiatry

## 2021-03-07 DIAGNOSIS — E782 Mixed hyperlipidemia: Secondary | ICD-10-CM | POA: Diagnosis not present

## 2021-03-07 DIAGNOSIS — E0859 Diabetes mellitus due to underlying condition with other circulatory complications: Secondary | ICD-10-CM | POA: Diagnosis not present

## 2021-03-07 DIAGNOSIS — L97512 Non-pressure chronic ulcer of other part of right foot with fat layer exposed: Secondary | ICD-10-CM | POA: Diagnosis not present

## 2021-03-07 DIAGNOSIS — I10 Essential (primary) hypertension: Secondary | ICD-10-CM | POA: Diagnosis not present

## 2021-03-07 DIAGNOSIS — E039 Hypothyroidism, unspecified: Secondary | ICD-10-CM | POA: Diagnosis not present

## 2021-03-07 DIAGNOSIS — E1142 Type 2 diabetes mellitus with diabetic polyneuropathy: Secondary | ICD-10-CM | POA: Diagnosis not present

## 2021-03-07 MED ORDER — DOXYCYCLINE HYCLATE 100 MG PO TABS: 100.0000 mg | ORAL_TABLET | Freq: Two times a day (BID) | ORAL | 0 refills | Status: DC

## 2021-03-07 NOTE — Progress Notes (Signed)
Subjective:  Patient status post bone biopsy with debridement of wounds to the bilateral feet.  DOS: 12/26/2020.  Patient states that her foot was doing very well when she was having nurses come to the house for dressing changes.  Since the nurses discontinue the dressing changes the wounds have slightly deteriorated.  She presents for her monthly follow-up  Past Medical History:  Diagnosis Date   Acute respiratory failure ([place])    Anemia    previous blood transfusions   Arthritis    "all over"   Asthma    Atrial flutter (HCC)    Bradycardia    requiring previous d/c of BB and reduction of amiodarone   CAD (coronary artery disease)    nonobstructive per notes   Chronic diastolic CHF (congestive heart failure) ([place])    CKD (chronic kidney disease), stage III ([place])    Complication of blood transfusion    "got the wrong blood type at [facility] in ~ 2015; no adverse reaction that we are aware of"/daughter, [name] (01/27/2016)   COPD (chronic obstructive pulmonary disease) ([place])    Depression    "light case"   DVT (deep venous thrombosis) ([place]) 01/2016   a. LLE DVT 01/2016 - switched from Eliquis to Coumadin.   Dyspnea    with some activity   Gastric stenosis    a. s/p stomach tube   GERD (gastroesophageal reflux disease)    Headache    History of 2019 novel coronavirus disease (COVID-19)    History of blood transfusion    "several" (01/27/2016)   History of stomach ulcers    Hyperlipidemia    Hypertension    Hypothyroidism    On home oxygen therapy    "2L; 9PM til 9AM" (06/27/2017)   PAD (peripheral artery disease) (HCC)    a. prior gangrene, toe amputations, intervention.   PAF (paroxysmal atrial fibrillation) ([place])    Paraesophageal hernia    Perforated gastric ulcer ([place])    Pneumonia    "a few times" (06/27/2017)   Seasonal allergies    SIADH (syndrome of inappropriate ADH production) ([place])    [name] 01/10/2015   Small bowel obstruction ([place])    "I don't know how  many" (01/11/2015)   Stroke ([place])    several- no residual   Type II diabetes mellitus ([place])    "related to prednisone use  for > 20 yr; once predinose stopped; no more DM RX" (01/27/2016)   UTI (urinary tract infection) 02/08/2016   Ventral hernia with bowel obstruction        Objective: Physical Exam General: The patient is alert and oriented x3 in no acute distress.  Dermatology: Ulcers noted to the right plantar forefoot.  Please see attached photo.  No malodor.  Moderate drainage noted.  Vascular: Patient recently underwent balloon angioplasty with vein and vascular  VAS [place] ABI W/WO TBI 09/10/2020 Right: Resting right ankle-brachial index is within normal range using the  posterior tibial artery . No evidence of significant right lower extremity  arterial disease.   Left: Resting left ankle-brachial index indicates noncompressible left  lower extremity arteries. Unable to obtain left great toe index due to  bandage.   Neurological: Epicritic and protective threshold diminished bilaterally.   Musculoskeletal Exam: Status post TMA right foot.  Hallux valgus left with history of overlying ulcer and degenerative changes possible exposed bone and joint capsule.  Second and third toe amputation left.  Radiographic exam today 12/13/2020:  There continues to be left foot  demonstration of history of left second and third toe amputation with resection of the metatarsal heads as well and hallux valgus deformity.  Diffuse degenerative changes noted.  No gas within the tissue.  Cortical erosive changes noted to the medial aspect of the first metatarsal head just underlying the area of the wound which is consistent with osteomyelitis of the distal portion of the first metatarsal.  Right foot demonstrates ulcer at the distal stump of the second metatarsal with some osteolytic changes at the distal portion of the metatarsal consistent with findings of osteomyelitis  Assessment: 1. s/p  transmetatarsal amputation right foot. DOS: 11/14/2019 2.  Ulcers right forefoot amputation stump 3.  H/o 2nd toe amputation left 4.  H/o ulcer left first MTP joint; currently healed  Plan of Care:  1. Patient was evaluated.  The patient's daughter states that they did have a nurse coming to the home performing dressing changes.  This was stopped apparently due to staffing shortages.  Today were going to attempt to reinitiate home health dressing changes which would consist of Betadine around the periwound and silver alginate dressing 2.  Continue postsurgical shoes 3.  Continue silver alginate wound dressing  4.  The patient continues to request conservative treatment only.  We will continue conservative wound care 5.  Due to the slightly increased drainage to the right forefoot we will go ahead and prescribe doxycycline 100 mg 2 times daily.  Patient has tolerated this well in the past  6.  Return to clinic 4 weeks   Edrick Kins, DPM [facility]  Dr. Edrick Kins, DPM    [address], [place] [address]                Office [phone]  Fax [phone]

## 2021-03-08 LAB — COMPLETE METABOLIC PANEL WITH GFR
AG Ratio: 1 (calc) (ref 1.0–2.5)
ALT: 14 U/L (ref 6–29)
AST: 24 U/L (ref 10–35)
Albumin: 3.6 g/dL (ref 3.6–5.1)
Alkaline phosphatase (APISO): 91 U/L (ref 37–153)
BUN/Creatinine Ratio: 40 (calc) — ABNORMAL HIGH (ref 6–22)
BUN: 68 mg/dL — ABNORMAL HIGH (ref 7–25)
CO2: 29 mmol/L (ref 20–32)
Calcium: 9.4 mg/dL (ref 8.6–10.4)
Chloride: 101 mmol/L (ref 98–110)
Creat: 1.72 mg/dL — ABNORMAL HIGH (ref 0.60–0.95)
Globulin: 3.7 g/dL (calc) (ref 1.9–3.7)
Glucose, Bld: 46 mg/dL — ABNORMAL LOW (ref 65–99)
Potassium: 4.4 mmol/L (ref 3.5–5.3)
Sodium: 139 mmol/L (ref 135–146)
Total Bilirubin: 0.5 mg/dL (ref 0.2–1.2)
Total Protein: 7.3 g/dL (ref 6.1–8.1)
eGFR: 27 mL/min/{1.73_m2} — ABNORMAL LOW (ref >=60)

## 2021-03-08 LAB — TSH: TSH: 3.06 mIU/L (ref 0.40–4.50)

## 2021-03-08 LAB — CBC WITH DIFFERENTIAL/PLATELET
Absolute Monocytes: 794 cells/uL (ref 200–950)
Basophils Absolute: 68 cells/uL (ref 0–200)
Basophils Relative: 1.1 %
Eosinophils Absolute: 174 cells/uL (ref 15–500)
Eosinophils Relative: 2.8 %
HCT: 28.4 % — ABNORMAL LOW (ref 35.0–45.0)
Hemoglobin: 9.3 g/dL — ABNORMAL LOW (ref 11.7–15.5)
Lymphs Abs: 1023 cells/uL (ref 850–3900)
MCH: 32 pg (ref 27.0–33.0)
MCHC: 32.7 g/dL (ref 32.0–36.0)
MCV: 97.6 fL (ref 80.0–100.0)
MPV: 12.6 fL — ABNORMAL HIGH (ref 7.5–12.5)
Monocytes Relative: 12.8 %
Neutro Abs: 4142 cells/uL (ref 1500–7800)
Neutrophils Relative %: 66.8 %
Platelets: 276 10*3/uL (ref 140–400)
RBC: 2.91 10*6/uL — ABNORMAL LOW (ref 3.80–5.10)
RDW: 14.7 % (ref 11.0–15.0)
Total Lymphocyte: 16.5 %
WBC: 6.2 10*3/uL (ref 3.8–10.8)

## 2021-03-08 LAB — LIPID PANEL
Cholesterol: 106 mg/dL (ref <200)
HDL: 51 mg/dL (ref >=50)
LDL Cholesterol (Calc): 35 mg/dL (calc)
Non-HDL Cholesterol (Calc): 55 mg/dL (calc) (ref <130)
Total CHOL/HDL Ratio: 2.1 (calc) (ref <5.0)
Triglycerides: 113 mg/dL (ref <150)

## 2021-03-08 LAB — HEMOGLOBIN A1C
Hgb A1c MFr Bld: 5.4 % of total Hgb (ref <5.7)
Mean Plasma Glucose: 108 mg/dL
eAG (mmol/L): 6 mmol/L

## 2021-03-09 ENCOUNTER — Ambulatory Visit (INDEPENDENT_AMBULATORY_CARE_PROVIDER_SITE_OTHER): Payer: Medicare Other | Admitting: Nurse Practitioner

## 2021-03-09 ENCOUNTER — Encounter: Payer: Self-pay | Admitting: Nurse Practitioner

## 2021-03-09 ENCOUNTER — Other Ambulatory Visit: Payer: Self-pay

## 2021-03-09 VITALS — BP 130/68 | HR 70 | Temp 97.5°F | Ht 66.0 in | Wt 123.8 lb

## 2021-03-09 DIAGNOSIS — E039 Hypothyroidism, unspecified: Secondary | ICD-10-CM

## 2021-03-09 DIAGNOSIS — E1142 Type 2 diabetes mellitus with diabetic polyneuropathy: Secondary | ICD-10-CM | POA: Diagnosis not present

## 2021-03-09 DIAGNOSIS — K921 Melena: Secondary | ICD-10-CM

## 2021-03-09 DIAGNOSIS — S30811A Abrasion of abdominal wall, initial encounter: Secondary | ICD-10-CM

## 2021-03-09 DIAGNOSIS — I70235 Atherosclerosis of native arteries of right leg with ulceration of other part of foot: Secondary | ICD-10-CM | POA: Diagnosis not present

## 2021-03-09 DIAGNOSIS — L89152 Pressure ulcer of sacral region, stage 2: Secondary | ICD-10-CM

## 2021-03-09 DIAGNOSIS — K9423 Gastrostomy malfunction: Secondary | ICD-10-CM

## 2021-03-09 DIAGNOSIS — M868X7 Other osteomyelitis, ankle and foot: Secondary | ICD-10-CM | POA: Diagnosis not present

## 2021-03-09 NOTE — Progress Notes (Signed)
Careteam: Patient Care Team: [name], NP as PCP - General (Geriatric Medicine) Dorothy Spark, MD (Inactive) as PCP - Cardiology (Cardiology) Edrick Kins, DPM as Consulting Physician (Podiatry) Dorothy Spark, MD (Inactive) as Consulting Physician (Cardiology) [name], NP as Nurse Practitioner (Geriatric Medicine) Camillo Flaming, [place] as Referring Physician (Optometry) [name], RN as [place] Management  PLACE OF SERVICE:  [place] Directive information Does Patient Have a Medical Advance Directive?: Yes, Type of Advance Directive: [place], Does patient want to make changes to medical advance directive?: No - Patient declined  Allergies  Allergen Reactions   Penicillins Itching, Rash and Other (See Comments)    Tolerated amoxicillin, unasyn, zosyn & cephalosporins in the past. Did it involve swelling of the face/tongue/throat, SOB, or low BP? No Did it involve sudden or severe rash/hives, skin peeling, or any reaction on the inside of your mouth or nose? No Did you need to seek medical attention at a hospital or doctor's office? Unknown When did it last happen?      5+ years If all above answers are "NO", may proceed with cephalosporin use.     Chief Complaint  Patient presents with   Medical Management of Chronic Issues    4 month follow up. Patient was in hospital from 02/14/2021 until 02/16/2021 for GI bleed. Patient still weak. Patient has concerns about tube. The site is running a lot and having to change it about 2-3 times a day. It looks raw. Would like to know about getting gauze that would keep area dry. Patient has ulcer on bottom for about a month. She has been using neosporin and A&D ointment. Painful.   Health Maintenance    Zoster vaccine, COVID booster, foot exam, eye exam     HPI: [name] for follow up.   Daughter reports she is overflowing through  her tube.  She is limited on her oral intake due to CHF. She is getting 180 cc water  4 times daily.  She was overflowing in the hospital. Daughter asked [facility] today if there was anything.  She has been overflowing prior to bleeding but afterwards tube busted and exchanged took place on 11/25.   Review of Systems:  Review of Systems  Constitutional:  Negative for chills, fever and weight loss.  Respiratory:  Negative for cough, sputum production and shortness of breath.   Cardiovascular:  Negative for chest pain, palpitations and leg swelling.  Gastrointestinal:  Positive for constipation. Negative for abdominal pain, diarrhea and heartburn.  Genitourinary:  Negative for dysuria, frequency and urgency.  Musculoskeletal:  Negative for back pain, falls, joint pain and myalgias.  Skin:  Positive for itching and rash.       Skin breakdown to sacrum and abdomen.   Neurological:  Negative for dizziness and headaches.  Psychiatric/Behavioral:  Negative for depression and memory loss. The patient does not have insomnia.    Past Medical History:  Diagnosis Date   Acute respiratory failure ([place])    Anemia    previous blood transfusions   Arthritis    "all over"   Asthma    Atrial flutter (HCC)    Bradycardia    requiring previous d/c of BB and reduction of amiodarone   CAD (coronary artery disease)    nonobstructive per notes   Chronic diastolic CHF (congestive heart failure) (HCC)    CKD (chronic kidney disease), stage III (  [place])    Complication of blood transfusion    "got the wrong blood type at [facility] in ~ 2015; no adverse reaction that we are aware of"/daughter, [name] (01/27/2016)   COPD (chronic obstructive pulmonary disease) ([place])    Depression    "light case"   DVT (deep venous thrombosis) ([place]) 01/2016   a. LLE DVT 01/2016 - switched from Eliquis to Coumadin.   Dyspnea    with some activity   Gastric stenosis    a. s/p stomach tube   GERD (gastroesophageal reflux  disease)    GI bleed    Headache    History of 2019 novel coronavirus disease (COVID-19)    History of blood transfusion    "several" (01/27/2016)   History of stomach ulcers    Hyperlipidemia    Hypertension    Hypothyroidism    On home oxygen therapy    "2L; 9PM til 9AM" (06/27/2017)   PAD (peripheral artery disease) (HCC)    a. prior gangrene, toe amputations, intervention.   PAF (paroxysmal atrial fibrillation) ([place])    Paraesophageal hernia    Perforated gastric ulcer ([place])    Pneumonia    "a few times" (06/27/2017)   Seasonal allergies    SIADH (syndrome of inappropriate ADH production) ([place])    [name] 01/10/2015   Small bowel obstruction ([place])    "I don't know how many" (01/11/2015)   Stroke ([place])    several- no residual   Type II diabetes mellitus ([place])    "related to prednisone use  for > 20 yr; once predinose stopped; no more DM RX" (01/27/2016)   UTI (urinary tract infection) 02/08/2016   Ventral hernia with bowel obstruction    Past Surgical History:  Procedure Laterality Date   ABDOMINAL AORTOGRAM N/A 09/21/2016   Procedure: Abdominal Aortogram;  Surgeon: Waynetta Sandy, MD;  Location: [facility];  Service: Cardiovascular;  Laterality: N/A;   ABDOMINAL AORTOGRAM W/LOWER EXTREMITY N/A 10/20/2020   Procedure: ABDOMINAL AORTOGRAM W/LOWER EXTREMITY;  Surgeon: Cherre Robins, MD;  Location: [facility];  Service: Cardiovascular;  Laterality: N/A;   AMPUTATION Left 09/25/2016   Procedure: AMPUTATION DIGIT- LEFT 2ND AND 3RD TOES;  Surgeon: Rosetta Posner, MD;  Location: [facility];  Service: Vascular;  Laterality: Left;   AMPUTATION Right 05/03/2018   Procedure: AMPUTATION RIGHT GREAT TOE;  Surgeon: Rosetta Posner, MD;  [facility];  Service: Vascular;  Laterality: Right;   CATARACT EXTRACTION W/ INTRAOCULAR LENS  IMPLANT, BILATERAL     CHOLECYSTECTOMY OPEN     COLECTOMY     ESOPHAGOGASTRODUODENOSCOPY N/A 01/19/2014   Procedure:  ESOPHAGOGASTRODUODENOSCOPY (EGD);  Surgeon: Irene Shipper, MD;  Location: [facility];  Service: Endoscopy;  Laterality: N/A;   ESOPHAGOGASTRODUODENOSCOPY N/A 01/20/2014   Procedure: ESOPHAGOGASTRODUODENOSCOPY (EGD);  Surgeon: Irene Shipper, MD;  Location: [facility];  Service: Endoscopy;  Laterality: N/A;   ESOPHAGOGASTRODUODENOSCOPY N/A 03/19/2014   Procedure: ESOPHAGOGASTRODUODENOSCOPY (EGD);  Surgeon: Milus Banister, MD;  Location: [facility];  Service: Endoscopy;  Laterality: N/A;   ESOPHAGOGASTRODUODENOSCOPY N/A 07/08/2015   Procedure: ESOPHAGOGASTRODUODENOSCOPY (EGD);  Surgeon: Doran Stabler, MD;  Location: [facility];  Service: Endoscopy;  Laterality: N/A;   ESOPHAGOGASTRODUODENOSCOPY (EGD) WITH PROPOFOL N/A 09/15/2015   Procedure: ESOPHAGOGASTRODUODENOSCOPY (EGD) WITH PROPOFOL;  Surgeon: Manus Gunning, MD;  [facility];  Service: Gastroenterology;  Laterality: N/A;   ESOPHAGOGASTRODUODENOSCOPY (EGD) WITH PROPOFOL N/A 10/12/2020   Procedure: ESOPHAGOGASTRODUODENOSCOPY (EGD) WITH PROPOFOL;  Surgeon: Carlean Purl,  Ofilia Neas, MD;  Location: [facility] ENDOSCOPY;  Service: Endoscopy;  Laterality: N/A;   FOOT SURGERY     GASTROJEJUNOSTOMY     hx/notes 01/10/2015   GASTROJEJUNOSTOMY N/A 09/23/2015   Procedure: OPEN GASTROJEJUNOSTOMY TUBE PLACEMENT;  Surgeon: Arta Bruce Kinsinger, MD;  [facility];  Service: General;  Laterality: N/A;   GLAUCOMA SURGERY Bilateral    HEMATOMA EVACUATION Right 10/20/2020   Procedure: EVACUATION HEMATOMA RIGHT GROIN;  Surgeon: Cherre Robins, MD;  Location: [facility];  Service: Vascular;  Laterality: Right;   IR CM INJ ANY COLONIC TUBE W/FLUORO  01/05/2017   IR CM INJ ANY COLONIC TUBE W/FLUORO  06/07/2017   IR CM INJ ANY COLONIC TUBE W/FLUORO  11/05/2017   IR CM INJ ANY COLONIC TUBE W/FLUORO  09/18/2018   IR CM INJ ANY COLONIC TUBE W/FLUORO  03/06/2019   IR CM INJ ANY COLONIC TUBE W/FLUORO  03/11/2019   IR GASTR TUBE CONVERT GASTR-JEJ PER W/FL  MOD SED  04/30/2020   IR GENERIC HISTORICAL  01/07/2016   IR GJ TUBE CHANGE 01/07/2016 Jacqulynn Cadet, MD [facility]   IR GENERIC HISTORICAL  01/27/2016   IR MECH REMOV OBSTRUC MAT ANY COLON TUBE W/FLUORO 01/27/2016 Markus Daft, MD [facility]   IR GENERIC HISTORICAL  02/07/2016   IR PATIENT EVAL TECH 0-60 MINS Darrell K Allred, PA-C [facility]   IR GENERIC HISTORICAL  02/08/2016   IR [place] TUBE CHANGE 02/08/2016 Greggory Keen, MD [facility]   IR GENERIC HISTORICAL  01/06/2016   IR [place] TUBE CHANGE 01/06/2016 [facility] OUT REF   IR GENERIC HISTORICAL  05/02/2016   IR CM INJ ANY COLONIC TUBE W/FLUORO 05/02/2016 Arne Cleveland, MD [facility]   IR GENERIC HISTORICAL  05/15/2016   IR GJ TUBE CHANGE 05/15/2016 Sandi Mariscal, MD [facility]   IR GENERIC HISTORICAL  06/28/2016   IR GJ TUBE CHANGE 06/28/2016 [facility]   IR GJ TUBE CHANGE  02/20/2017   IR GJ TUBE CHANGE  05/10/2017   IR GJ TUBE CHANGE  07/06/2017   IR GJ TUBE CHANGE  08/02/2017   IR GJ TUBE CHANGE  10/15/2017   IR GJ TUBE CHANGE  12/26/2017   IR GJ TUBE CHANGE  04/08/2018   IR GJ TUBE CHANGE  06/19/2018   IR GJ TUBE CHANGE  03/03/2019   IR GJ TUBE CHANGE  06/06/2019   IR GJ TUBE CHANGE  09/26/2019   IR GJ TUBE CHANGE  01/09/2020   IR GJ TUBE CHANGE  05/12/2020   IR GJ TUBE CHANGE  10/05/2020   IR GJ TUBE CHANGE  10/14/2020   IR GJ TUBE CHANGE  10/19/2020   IR GJ TUBE CHANGE  02/25/2021   IR PATIENT EVAL TECH 0-60 MINS  10/19/2016   IR PATIENT EVAL TECH 0-60 MINS  12/25/2016   IR PATIENT EVAL TECH 0-60 MINS  01/29/2017   IR PATIENT EVAL TECH 0-60 MINS  04/04/2017   IR PATIENT EVAL TECH 0-60 MINS  04/30/2017   IR PATIENT EVAL TECH 0-60 MINS  07/31/2017   IR REPLC DUODEN/JEJUNO TUBE PERCUT W/FLUORO  11/14/2016   IRRIGATION AND DEBRIDEMENT FOOT Bilateral 12/26/2020   Procedure: IRRIGATION AND DEBRIDEMENT FOOT AND BONE BIOPSY;  Surgeon: Trula Slade, DPM;  Location: [facility];  Service: Podiatry;  Laterality:  Bilateral;   LAPAROTOMY N/A 01/20/2015   Procedure: EXPLORATORY LAPAROTOMY;  Surgeon: Coralie Keens, MD;  Location: [facility];  Service: General;  Laterality: N/A;   LOWER EXTREMITY ANGIOGRAPHY Left  09/21/2016   Procedure: Lower Extremity Angiography;  Surgeon: Waynetta Sandy, MD;  Location: [facility];  Service: Cardiovascular;  Laterality: Left;   LOWER EXTREMITY ANGIOGRAPHY Right 06/27/2017   Procedure: Lower Extremity Angiography;  Surgeon: Waynetta Sandy, MD;  Location: [facility];  Service: Cardiovascular;  Laterality: Right;   LOWER EXTREMITY ANGIOGRAPHY Right 12/24/2020   Procedure: LOWER EXTREMITY ANGIOGRAPHY;  Surgeon: Cherre Robins, MD;  Location: [facility];  Service: Cardiovascular;  Laterality: Right;   LYSIS OF ADHESION N/A 01/20/2015   Procedure: LYSIS OF ADHESIONS < 1 HOUR;  Surgeon: Coralie Keens, MD;  Location: [facility];  Service: General;  Laterality: N/A;   PERIPHERAL VASCULAR BALLOON ANGIOPLASTY Left 09/21/2016   Procedure: Peripheral Vascular Balloon Angioplasty;  Surgeon: Waynetta Sandy, MD;  Location: [facility];  Service: Cardiovascular;  Laterality: Left;  drug coated balloon   PERIPHERAL VASCULAR BALLOON ANGIOPLASTY Right 06/27/2017   Procedure: PERIPHERAL VASCULAR BALLOON ANGIOPLASTY;  Surgeon: Waynetta Sandy, MD;  Location: [facility];  Service: Cardiovascular;  Laterality: Right;  SFA/[id number]   PERIPHERAL VASCULAR INTERVENTION Left 10/20/2020   Procedure: PERIPHERAL VASCULAR INTERVENTION;  Surgeon: Cherre Robins, MD;  Location: [facility];  Service: Cardiovascular;  Laterality: Left;   TONSILLECTOMY     TRANSMETATARSAL AMPUTATION Right 11/14/2019   Procedure: TRANSMETATARSAL AMPUTATION RIGHT FOOT;  Surgeon: Edrick Kins, DPM;  Location: [facility];  Service: Podiatry;  Laterality: Right;   TUBAL LIGATION     VENTRAL HERNIA REPAIR  2015   incarcerated ventral hernia ([facility]  09/2013)/notes 01/10/2015   WOUND EXPLORATION Right 10/20/2020   Procedure: PRIMARY CLOSURE COMMON FEMORAL ARTERY;  Surgeon: Cherre Robins, MD;  Location: [facility];  Service: Vascular;  Laterality: Right;   Social History:   reports that she has never smoked. She quit smokeless tobacco use about 40 years ago.  Her smokeless tobacco use included snuff. She reports that she does not drink alcohol and does not use drugs.  Family History  Problem Relation Age of Onset   Stroke Mother    Hypertension Mother    Diabetes Brother    Glaucoma Son    Glaucoma Son    Heart attack Neg Hx    Colon cancer Neg Hx    Stomach cancer Neg Hx     Medications: Patient's Medications  New Prescriptions   No medications on file  Previous Medications   ACETAMINOPHEN (TYLENOL) 500 MG TABLET    Take 500 mg by mouth every 6 (six) hours as needed for moderate pain.   AMLODIPINE (NORVASC) 5 MG TABLET    TAKE 1 TABLET BY MOUTH EVERY DAY   ASPIRIN 81 MG EC TABLET    Take 1 tablet (81 mg total) by mouth daily. Swallow whole.   ATORVASTATIN (LIPITOR) 20 MG TABLET    Take 0.5 tablets (10 mg total) by mouth daily.   B COMPLEX VITAMINS TABLET    Take 1 tablet by mouth daily.    CHOLECALCIFEROL (VITAMIN D3) 25 MCG (1000 UNIT) TABLET    Take 1,000 Units by mouth daily.   CYCLOSPORINE (RESTASIS) 0.05 % OPHTHALMIC EMULSION    USE 1 DROP INTO BOTH EYES TWICE DAILY   DOXYCYCLINE (VIBRA-TABS) 100 MG TABLET    Take 1 tablet (100 mg total) by mouth 2 (two) times daily.   DULOXETINE (CYMBALTA) 30 MG CAPSULE    TAKE 1 CAPSULE BY MOUTH EVERY DAY   FERROUS SULFATE 220 (  44 FE) MG/5ML SOLUTION    TAKE 7.4 MLS (325 MG TOTAL) BY MOUTH DAILY.   FLUTICASONE (FLONASE) 50 MCG/ACT NASAL SPRAY    2 sprays into each nostril daily.   FUROSEMIDE (LASIX) 40 MG TABLET    TAKE 0.5-1 TABLETS (20-40 MG TOTAL) BY MOUTH SEE ADMIN INSTRUCTIONS. TAKE 20 MG DAILY, MAY INSTEAD TAKE 40 MG DAILY AS NEEDED FOR EDEMA   GABAPENTIN (NEURONTIN) 300 MG CAPSULE     TAKE 1 CAPSULE BY MOUTH TWICE A DAY   GENTAMICIN CREAM (GARAMYCIN) 0.1 %    Apply 1 application topically daily as needed (dry skin).   HYDRALAZINE (APRESOLINE) 50 MG TABLET    Take 1 tablet (50 mg total) by mouth 3 (three) times daily.   INSULIN PEN NEEDLE (B-D UF III MINI PEN NEEDLES) 31G X 5 MM MISC    Use twice daily with giving insulin injections. Dx E11.9   LATANOPROST (XALATAN) 0.005 % OPHTHALMIC SOLUTION    Place 1 drop into both eyes at bedtime.   LEVOTHYROXINE (SYNTHROID) 125 MCG TABLET    TAKE 1 TABLET BY MOUTH EVERY DAY   LIVER OIL-ZINC OXIDE (DESITIN) 40 % OINTMENT    Apply topically 2 (two) times daily.   LORATADINE (CLARITIN) 10 MG TABLET    Take 10 mg by mouth daily.   MOMETASONE-FORMOTEROL (DULERA) 200-5 MCG/ACT AERO    INHALE TWO PUFFS BY MOUTH TWICE DAILY   MULTIPLE VITAMIN (MULTIVITAMIN WITH MINERALS) TABS TABLET    Take 1 tablet by mouth daily.   NUTRITIONAL SUPPLEMENTS (FEEDING SUPPLEMENT, OSMOLITE 1.5 CAL,) LIQD    Place 711 mLs into feeding tube See admin instructions. Take 711 ml over a 12 hour period per day 9pm-9am at a rate of 65   NYSTATIN CREAM (MYCOSTATIN)    APPLY TO AFFECTED AREA TWICE A DAY   OXYCODONE (ROXICODONE) 5 MG IMMEDIATE RELEASE TABLET    Take 0.5-1 tablets (2.5-5 mg total) by mouth every 8 (eight) hours as needed for severe pain.   PANTOPRAZOLE (PROTONIX) 40 MG TABLET    Take 1 tablet (40 mg total) by mouth 2 (two) times daily.   POLYETHYL GLYCOL-PROPYL GLYCOL (SYSTANE) 0.4-0.3 % GEL OPHTHALMIC GEL    Place 1 application into both eyes 2 (two) times daily as needed (moisture).   PROBIOTIC PRODUCT (EQL PROBIOTIC COLON SUPPORT PO)    Take 1 capsule by mouth daily.   SENNA-DOCUSATE (SENOKOT-S) 8.6-50 MG TABLET    Take 1 tablet by mouth at bedtime.   SODIUM CHLORIDE-SODIUM BICARB (NETI POT SINUS WASH NA)    Place 1 application into the nose at bedtime.   SUCRALFATE (CARAFATE) 1 GM/10ML SUSPENSION    Take 10 mLs (1 g total) by mouth every 6 (six) hours.    TRAMADOL (ULTRAM) 50 MG TABLET    Place 1 tablet (50 mg total) into feeding tube every 6 (six) hours as needed for severe pain.   WATER FOR IRRIGATION, STERILE (FREE WATER) SOLN    Place 180 mLs into feeding tube 3 (three) times daily.   WHITE PETROLATUM-MINERAL OIL (SYSTANE NIGHTTIME) OINT    Place 1 application into both eyes at bedtime.   Modified Medications   No medications on file  Discontinued Medications   No medications on file    Physical Exam:  There were no vitals filed for this visit. There is no height or weight on file to calculate BMI. Wt Readings from Last 3 Encounters:  02/14/21 126 lb (57.2 kg)  01/25/21 125 lb  8 oz (56.9 kg)  01/06/21 127 lb 9.6 oz (57.9 kg)    Physical Exam Constitutional:      General: She is not in acute distress.    Appearance: She is well-developed. She is not diaphoretic.  HENT:     Head: Normocephalic and atraumatic.     Mouth/Throat:     Pharynx: No oropharyngeal exudate.  Eyes:     Conjunctiva/sclera: Conjunctivae normal.     Pupils: Pupils are equal, round, and reactive to light.  Cardiovascular:     Rate and Rhythm: Normal rate and regular rhythm.     Heart sounds: Normal heart sounds.  Pulmonary:     Effort: Pulmonary effort is normal.     Breath sounds: Normal breath sounds.  Abdominal:     General: Bowel sounds are normal.     Palpations: Abdomen is soft.  Musculoskeletal:     Cervical back: Normal range of motion and neck supple.     Right lower leg: No edema.     Left lower leg: No edema.  Skin:    General: Skin is warm and dry.     Comments: Excoriation around PEG site.  Increase drainage noted   3- 1 cm linear ulcers noted to right buttock in the sacral area  Neurological:     Mental Status: She is alert.  Psychiatric:        Mood and Affect: Mood normal.    Labs reviewed: Basic Metabolic Panel: Recent Labs    11/04/20 1114 12/20/20 1443 12/27/20 0103 12/28/20 0417 12/28/20 1539 12/29/20 0203  12/30/20 0233 02/15/21 0947 02/16/21 0115 03/07/21 1120  NA 135   < > 129* 130*   < > 131*   < > 143 139 139  K 4.9   < > 4.3 4.4   < > 4.3   < > 4.2 4.2 4.4  CL 101   < > 100 103   < > 103   < > 112* 110 101  CO2 28   < > 21* 22   < > 23   < > 22 20* 29  GLUCOSE 74   < > 191* 142*   < > 107*   < > 118* 163* 46*  BUN 61*   < > 39* 49*   < > 42*   < > 67* 52* 68*  CREATININE 1.61*   < > 1.21* 1.15*   < > 1.11*   < > 1.37* 1.31* 1.72*  CALCIUM 9.5   < > 8.6* 8.9   < > 9.0   < > 9.2 8.9 9.4  MG  --    < > 1.7 1.8  --  2.0  --   --   --   --   PHOS  --    < > 3.5 3.0  --  2.7  --   --   --   --   TSH 0.41  --   --   --   --   --   --   --   --  3.06   < > = values in this interval not displayed.   Liver Function Tests: Recent Labs    12/28/20 1539 12/29/20 0203 02/14/21 1447 03/07/21 1120  AST 47* 38 22 24  ALT 42 36 17 14  ALKPHOS 82 80 76  --   BILITOT 0.6 0.4 0.3 0.5  PROT 6.1* 5.8* 7.0 7.3  ALBUMIN 2.5* 2.4* 3.0*  --  Recent Labs    04/22/20 1232  LIPASE 25   No results for input(s): AMMONIA in the last 8760 hours. CBC: Recent Labs    12/29/20 0203 12/30/20 0233 01/06/21 1225 02/14/21 1447 02/15/21 0345 02/15/21 1329 02/16/21 0115 03/07/21 1120  WBC 4.5   < > 5.4 9.2  --   --  7.5 6.2  NEUTROABS 2.3  --  3,580  --   --   --   --  4,142  HGB 8.6*   < > 9.0* 5.3*   < > 9.3* 9.4* 9.3*  HCT 25.2*   < > 27.3* 17.2*   < > 27.4* 28.7* 28.4*  MCV 94.7   < > 98.2 105.5*  --   --  94.4 97.6  PLT 187   < > 271 300  --   --  248 276   < > = values in this interval not displayed.   Lipid Panel: Recent Labs    11/04/20 1114 03/07/21 1120  CHOL 111 106  HDL 55 51  LDLCALC 39 35  TRIG 91 113  CHOLHDL 2.0 2.1   TSH: Recent Labs    11/04/20 1114 03/07/21 1120  TSH 0.41 3.06   A1C: Lab Results  Component Value Date   HGBA1C 5.4 03/07/2021     Assessment/Plan 1. Other osteomyelitis of left foot ([place]) -followed by podiatry for ulcer. Per note on 12/5  trying to get home health nursing for dressing changes. Continues with post surgical shoes  2. PEG tube malfunction ([place]) - followed by radiology, plan to exchange tube with larger when available however was leaking prior to this.  -has increase in drainage causing excoriation and yeast around site.  - Ambulatory referral to [facility] to help manage.   3. Excoriation of abdomen, initial encounter -to use barrier cream with nystatin ointment. Cover area with drain sponge and abd dressing - Ambulatory referral to [facility]  4. Pressure injury of sacral region, stage 2 ([place]) Area cleaned, pressure reduction recommended.  -foam dressing applied. To change daily and as needed when soiled.  - Ambulatory referral to Home Health  5. Gastrointestinal hemorrhage with melena -no recurrent bleeding. Hgb stable on recnet labs.   6. Type 2 diabetes mellitus with peripheral neuropathy (HCC) -a1c at goal. No longer on medication.   7. Acquired hypothyroidism -tsh stable on synthroid 125 mcg.     Next appt: 3 months.  [name]. [place], [facility] [phone]

## 2021-03-11 ENCOUNTER — Ambulatory Visit: Payer: Medicare Other | Admitting: Nurse Practitioner

## 2021-03-15 ENCOUNTER — Telehealth: Payer: Self-pay | Admitting: *Deleted

## 2021-03-15 DIAGNOSIS — K285 Chronic or unspecified gastrojejunal ulcer with perforation: Secondary | ICD-10-CM | POA: Diagnosis not present

## 2021-03-15 DIAGNOSIS — J449 Chronic obstructive pulmonary disease, unspecified: Secondary | ICD-10-CM | POA: Diagnosis not present

## 2021-03-15 DIAGNOSIS — K5900 Constipation, unspecified: Secondary | ICD-10-CM | POA: Diagnosis not present

## 2021-03-15 DIAGNOSIS — K219 Gastro-esophageal reflux disease without esophagitis: Secondary | ICD-10-CM | POA: Diagnosis not present

## 2021-03-15 DIAGNOSIS — J9610 Chronic respiratory failure, unspecified whether with hypoxia or hypercapnia: Secondary | ICD-10-CM | POA: Diagnosis not present

## 2021-03-15 DIAGNOSIS — I4892 Unspecified atrial flutter: Secondary | ICD-10-CM | POA: Diagnosis not present

## 2021-03-15 DIAGNOSIS — F325 Major depressive disorder, single episode, in full remission: Secondary | ICD-10-CM | POA: Diagnosis not present

## 2021-03-15 DIAGNOSIS — I4819 Other persistent atrial fibrillation: Secondary | ICD-10-CM | POA: Diagnosis not present

## 2021-03-15 DIAGNOSIS — D631 Anemia in chronic kidney disease: Secondary | ICD-10-CM | POA: Diagnosis not present

## 2021-03-15 DIAGNOSIS — E1142 Type 2 diabetes mellitus with diabetic polyneuropathy: Secondary | ICD-10-CM | POA: Diagnosis not present

## 2021-03-15 DIAGNOSIS — E1169 Type 2 diabetes mellitus with other specified complication: Secondary | ICD-10-CM | POA: Diagnosis not present

## 2021-03-15 DIAGNOSIS — M86172 Other acute osteomyelitis, left ankle and foot: Secondary | ICD-10-CM | POA: Diagnosis not present

## 2021-03-15 DIAGNOSIS — L89322 Pressure ulcer of left buttock, stage 2: Secondary | ICD-10-CM | POA: Diagnosis not present

## 2021-03-15 DIAGNOSIS — G8929 Other chronic pain: Secondary | ICD-10-CM | POA: Diagnosis not present

## 2021-03-15 DIAGNOSIS — E039 Hypothyroidism, unspecified: Secondary | ICD-10-CM | POA: Diagnosis not present

## 2021-03-15 DIAGNOSIS — I5033 Acute on chronic diastolic (congestive) heart failure: Secondary | ICD-10-CM | POA: Diagnosis not present

## 2021-03-15 DIAGNOSIS — E785 Hyperlipidemia, unspecified: Secondary | ICD-10-CM | POA: Diagnosis not present

## 2021-03-15 DIAGNOSIS — K439 Ventral hernia without obstruction or gangrene: Secondary | ICD-10-CM | POA: Diagnosis not present

## 2021-03-15 DIAGNOSIS — K311 Adult hypertrophic pyloric stenosis: Secondary | ICD-10-CM | POA: Diagnosis not present

## 2021-03-15 DIAGNOSIS — N184 Chronic kidney disease, stage 4 (severe): Secondary | ICD-10-CM | POA: Diagnosis not present

## 2021-03-15 DIAGNOSIS — M150 Primary generalized (osteo)arthritis: Secondary | ICD-10-CM | POA: Diagnosis not present

## 2021-03-15 DIAGNOSIS — E1122 Type 2 diabetes mellitus with diabetic chronic kidney disease: Secondary | ICD-10-CM | POA: Diagnosis not present

## 2021-03-15 DIAGNOSIS — I130 Hypertensive heart and chronic kidney disease with heart failure and stage 1 through stage 4 chronic kidney disease, or unspecified chronic kidney disease: Secondary | ICD-10-CM | POA: Diagnosis not present

## 2021-03-15 DIAGNOSIS — E1151 Type 2 diabetes mellitus with diabetic peripheral angiopathy without gangrene: Secondary | ICD-10-CM | POA: Diagnosis not present

## 2021-03-15 DIAGNOSIS — I2510 Atherosclerotic heart disease of native coronary artery without angina pectoris: Secondary | ICD-10-CM | POA: Diagnosis not present

## 2021-03-15 NOTE — Telephone Encounter (Signed)
Shannon [name] with [facility] called and stated that they have reopened patient for Nursing Services. Stated that they will be going out 2x4weeks and 1x5weeks.  Verbal order given.

## 2021-03-17 DIAGNOSIS — J9610 Chronic respiratory failure, unspecified whether with hypoxia or hypercapnia: Secondary | ICD-10-CM | POA: Diagnosis not present

## 2021-03-17 DIAGNOSIS — E1151 Type 2 diabetes mellitus with diabetic peripheral angiopathy without gangrene: Secondary | ICD-10-CM | POA: Diagnosis not present

## 2021-03-17 DIAGNOSIS — E1169 Type 2 diabetes mellitus with other specified complication: Secondary | ICD-10-CM | POA: Diagnosis not present

## 2021-03-17 DIAGNOSIS — E039 Hypothyroidism, unspecified: Secondary | ICD-10-CM | POA: Diagnosis not present

## 2021-03-17 DIAGNOSIS — E1142 Type 2 diabetes mellitus with diabetic polyneuropathy: Secondary | ICD-10-CM | POA: Diagnosis not present

## 2021-03-17 DIAGNOSIS — M86172 Other acute osteomyelitis, left ankle and foot: Secondary | ICD-10-CM | POA: Diagnosis not present

## 2021-03-21 ENCOUNTER — Other Ambulatory Visit: Payer: Self-pay | Admitting: *Deleted

## 2021-03-21 NOTE — Patient Outreach (Signed)
[place] [facility]) Care Management  03/21/2021  KENNIYA WESTRICH 16-Jun-1923 [id number]   Outgoing call placed to member, no answer, unable to leave voice message.  Will follow up within the next 3-4 business days.  [name], RN, MSN, [place] Manager [phone]

## 2021-03-21 NOTE — Patient Outreach (Signed)
[facility]) Care Management  [facility] Care Manager  03/21/2021   LANESHA HOFFERBER 1923/08/15 [id number]   Incoming call received back from member.  Denies any urgent concerns, encouraged to contact this care manager with questions.     Encounter Medications:  Outpatient Encounter Medications as of 03/21/2021  Medication Sig Note   acetaminophen (TYLENOL) 500 MG tablet Take 500 mg by mouth every 6 (six) hours as needed for moderate pain.    amLODipine (NORVASC) 5 MG tablet TAKE 1 TABLET BY MOUTH EVERY DAY    aspirin 81 MG EC tablet Take 1 tablet (81 mg total) by mouth daily. Swallow whole.    atorvastatin (LIPITOR) 20 MG tablet Take 0.5 tablets (10 mg total) by mouth daily.    b complex vitamins tablet Take 1 tablet by mouth daily.     cholecalciferol (VITAMIN D3) 25 MCG (1000 UNIT) tablet Take 1,000 Units by mouth daily.    cycloSPORINE (RESTASIS) 0.05 % ophthalmic emulsion USE 1 DROP INTO BOTH EYES TWICE DAILY    doxycycline (VIBRA-TABS) 100 MG tablet Take 1 tablet (100 mg total) by mouth 2 (two) times daily.    DULoxetine (CYMBALTA) 30 MG capsule TAKE 1 CAPSULE BY MOUTH EVERY DAY    ferrous sulfate 220 (44 Fe) MG/5ML solution TAKE 7.4 MLS (325 MG TOTAL) BY MOUTH DAILY. (Patient taking differently: Place 220 mg into feeding tube daily.) 02/15/2021: Only med pt takes through tube   fluticasone (FLONASE) 50 MCG/ACT nasal spray 2 sprays into each nostril daily.    furosemide (LASIX) 40 MG tablet TAKE 0.5-1 TABLETS (20-40 MG TOTAL) BY MOUTH SEE ADMIN INSTRUCTIONS. TAKE 20 MG DAILY, MAY INSTEAD TAKE 40 MG DAILY AS NEEDED FOR EDEMA    gabapentin (NEURONTIN) 300 MG capsule TAKE 1 CAPSULE BY MOUTH TWICE A DAY    gentamicin cream (GARAMYCIN) 0.1 % Apply 1 application topically daily as needed (dry skin).    hydrALAZINE (APRESOLINE) 50 MG tablet Take 1 tablet (50 mg total) by mouth 3 (three) times daily.    Insulin Pen Needle (B-D UF III MINI PEN NEEDLES) 31G X 5 MM MISC Use twice  daily with giving insulin injections. Dx E11.9    latanoprost (XALATAN) 0.005 % ophthalmic solution Place 1 drop into both eyes at bedtime.    levothyroxine (SYNTHROID) 125 MCG tablet TAKE 1 TABLET BY MOUTH EVERY DAY    liver oil-zinc oxide (DESITIN) 40 % ointment Apply topically 2 (two) times daily.    loratadine (CLARITIN) 10 MG tablet Take 10 mg by mouth daily.    mometasone-formoterol (DULERA) 200-5 MCG/ACT AERO INHALE TWO PUFFS BY MOUTH TWICE DAILY 02/14/2021: LF at [facility] on 12/01/20. 30 Day Supply    Multiple Vitamin (MULTIVITAMIN WITH MINERALS) TABS tablet Take 1 tablet by mouth daily.    Nutritional Supplements (FEEDING SUPPLEMENT, OSMOLITE 1.5 CAL,) LIQD Place 711 mLs into feeding tube See admin instructions. Take 711 ml over a 12 hour period per day 9pm-9am at a rate of 65    nystatin cream (MYCOSTATIN) APPLY TO AFFECTED AREA TWICE A DAY    oxyCODONE (ROXICODONE) 5 MG immediate release tablet Take 0.5-1 tablets (2.5-5 mg total) by mouth every 8 (eight) hours as needed for severe pain.    pantoprazole (PROTONIX) 40 MG tablet Take 1 tablet (40 mg total) by mouth 2 (two) times daily.    Polyethyl Glycol-Propyl Glycol (SYSTANE) 0.4-0.3 % GEL ophthalmic gel Place 1 application into both eyes 2 (two) times daily as needed (moisture).  Probiotic Product (EQL PROBIOTIC COLON SUPPORT PO) Take 1 capsule by mouth daily.    senna-docusate (SENOKOT-S) 8.6-50 MG tablet Take 1 tablet by mouth at bedtime.    Sodium Chloride-Sodium Bicarb (NETI POT SINUS WASH NA) Place 1 application into the nose at bedtime. 02/15/2021: Every night, continuous   sucralfate (CARAFATE) 1 GM/10ML suspension Take 10 mLs (1 g total) by mouth every 6 (six) hours.    traMADol (ULTRAM) 50 MG tablet Place 1 tablet (50 mg total) into feeding tube every 6 (six) hours as needed for severe pain.    Water For Irrigation, Sterile (FREE WATER) SOLN Place 180 mLs into feeding tube 3 (three) times daily.    White Petrolatum-Mineral Oil  (SYSTANE NIGHTTIME) OINT Place 1 application into both eyes at bedtime.     No facility-administered encounter medications on file as of 03/21/2021.    Functional Status:  In your present state of health, do you have any difficulty performing the following activities: 01/13/2021 12/29/2020  Hearing? N N  Vision? N N  Difficulty concentrating or making decisions? N N  Walking or climbing stairs? Y Y  Dressing or bathing? N Y  Doing errands, shopping? Malvin Johns  Preparing Food and eating ? N -  Using the Toilet? N -  In the past six months, have you accidently leaked urine? Y -  Do you have problems with loss of bowel control? N -  Managing your Medications? Y -  Managing your Finances? Y -  Housekeeping or managing your Housekeeping? Y -  Some recent data might be hidden    Fall/Depression Screening: Fall Risk  03/09/2021 01/13/2021 01/06/2021  Falls in the past year? 0 0 0  Number falls in past yr: 0 0 0  Injury with Fall? 0 0 0  Risk for fall due to : No Fall Risks No Fall Risks No Fall Risks  Follow up Falls evaluation completed Falls evaluation completed Falls evaluation completed   PHQ 2/9 Scores 01/13/2021 10/21/2019 07/21/2019 10/10/2018 08/07/2018 10/05/2017 02/28/2017  PHQ - 2 Score 0 0 0 0 0 0 0  Exception Documentation - - - - - - -  Not completed - - - - - - -    Assessment:   Care Plan Care Plan : General Plan of Care (Adult)  Updates made by Kemper Durie, RN since 03/21/2021 12:00 AM     Problem: Difficulty managing chronic medical conditions (CHF, DM, HTN, PVD)   Priority: High     Long-Range Goal: Member and family will report adequate management of chronic health conditions (CHF, DM, HTN, PVD)   Start Date: 02/21/2021  Expected End Date: 08/21/2021  This Visit's Progress: On track  Priority: High  Note:   Current Barriers:  Chronic Disease Management support and education needs related to CHF, HTN, DMII, and PVD  RNCM Clinical Goal(s):  Patient will verbalize  understanding of plan for management of CHF, HTN, DMII, and PVD as evidenced by member/family verbalizing stability in member's status take all medications exactly as prescribed and will call provider for medication related questions as evidenced by family reporting adherence    attend all scheduled medical appointments: PCP and Podiatry as evidenced by record of attendance        continue to work with RN Care Manager and/or Social Worker to address care management and care coordination needs related to CHF, HTN, DMII, and PVD as evidenced by adherence to CM Team Scheduled appointments     not experience hospital admission  as evidenced by review of EMR. Hospital Admissions in last 6 months = 4  through collaboration with RN Care manager, provider, and care team.   Interventions: Inter-disciplinary care team collaboration (see longitudinal plan of care) Evaluation of current treatment plan related to  self management and patient's adherence to plan as established by provider   Heart Failure Interventions:  (Status: Goal on Track (progressing): YES.)  Long Term Goal  Basic overview and discussion of pathophysiology of Heart Failure reviewed Provided education on low sodium diet Advised patient to weigh each morning after emptying bladder Discussed importance of daily weight and advised patient to weigh and record daily  Diabetes:  (Status: Goal on Track (progressing): YES.) Long Term Goal   Lab Results  Component Value Date   HGBA1C 5.5 11/04/2020  Assessed patient's understanding of A1c goal: <7% Discussed plans with patient for ongoing care management follow up and provided patient with direct contact information for care management team;      Provided patient with written educational materials related to hypo and hyperglycemia and importance of correct treatment;       Advised patient, providing education and rationale, to check cbg three times daily and record         PVD  (Status: Goal  on Track (progressing): YES.) Long Term Goal  Evaluation of current treatment plan related to  PVD ,  self-management and patient's adherence to plan as established by provider. Discussed plans with patient for ongoing care management follow up and provided patient with direct contact information for care management team Provided education to patient re: wound care; Reviewed scheduled/upcoming provider appointments including podiatry; Discussed plans with patient for ongoing care management follow up and provided patient with direct contact information for care management team;  Hypertension: (Status: Goal on Track (progressing): YES.) Last practice recorded BP readings:  BP Readings from Last 3 Encounters:  02/16/21 (!) 169/66  01/25/21 (!) 151/66  01/06/21 130/80  Most recent eGFR/CrCl:  Lab Results  Component Value Date   EGFR 36 (L) 01/06/2021    No components found for: CRCL  Provided education to patient re: stroke prevention, s/s of heart attack and stroke; Reviewed prescribed diet low salt Discussed plans with patient for ongoing care management follow up and provided patient with direct contact information for care management team; Advised patient, providing education and rationale, to monitor blood pressure daily and record, calling PCP for findings outside established parameters;   Patient Goals/Self-Care Activities: Take medications as prescribed   Attend all scheduled provider appointments Call provider office for new concerns or questions  watch for swelling in feet, ankles and legs every day weigh myself daily track symptoms and what helps feel better or worse check blood sugar at prescribed times: three times daily check feet daily for cuts, sores or redness enter blood sugar readings and medication or insulin into daily log take the blood sugar meter to all doctor visits   Update 11/22 - Along with above listed conditions, member also has had admissions for  anemia from GI bleed, recently discharged on 11/16.  Medications changed (Plavix stopped and Carafate started).  She will follow up with PCP on 12/7. Also continues to have wounds on feet post transmetatarsal amputations, follow up with podiatry on 12/5.  She is working to regain her strength, does not feel PT is needed.   Per daughter, she feel she could use more help now as member's condition has steadily declined over the last several months.  Will  send list of agencies for in home aide assistance, daughter aware there is an out of pocket fee as member does not have Medicaid.   Update 12/19 - Member was seen by podiatrist on 12/5, continues to have non-healing foot wound with drainage, ongoing dressing changes.  She now has home health nursing restarted for wound care of foot as well as new sacral wound.  Educated on ways to alleviate pressure and decrease pain.  She was placed on antibiotics, wondering if she will need this reordered for longer term use.  She will have home health nurse examine it tomorrow and contact provider for orders if needed.  Will follow up with podiatrist on 1/11.  Was also seen by PCP on 12/7, follow up on 3/10.  Cardiology appointment scheduled for 2/1.  She report ongoing weight loss, now down to 120 pounds and decreased appetite.  She is working with her family on full liquids that she will be able to tolerate along with tube feeding.         Plan:  Follow-up: Patient agrees to Care Plan and Follow-up. Follow-up in 1 month(s).  Kemper Durie, RN, MSN, CCM [facility] Care Management  [facility] Manager [phone]

## 2021-03-21 NOTE — Patient Instructions (Signed)
Visit Information  Thank you for taking time to visit with me today. Please don't hesitate to contact me if I can be of assistance to you before our next scheduled telephone appointment.  Following are the goals we discussed today:  Discuss potential need for refill of antibiotics for wound infection. Reposition frequently to decrease pressure on sacrum  Our next appointment is by telephone in one month.  The patient verbalized understanding of instructions, educational materials, and care plan provided today and agreed to receive a mailed copy of patient instructions, educational materials, and care plan.   The patient has been provided with contact information for the care management team and has been advised to call with any health related questions or concerns.   [name], RN, MSN, [place] Manager [phone]

## 2021-03-22 ENCOUNTER — Other Ambulatory Visit (HOSPITAL_COMMUNITY): Payer: Self-pay | Admitting: Interventional Radiology

## 2021-03-22 ENCOUNTER — Other Ambulatory Visit: Payer: Self-pay | Admitting: Nurse Practitioner

## 2021-03-22 DIAGNOSIS — E1151 Type 2 diabetes mellitus with diabetic peripheral angiopathy without gangrene: Secondary | ICD-10-CM | POA: Diagnosis not present

## 2021-03-22 DIAGNOSIS — E1169 Type 2 diabetes mellitus with other specified complication: Secondary | ICD-10-CM | POA: Diagnosis not present

## 2021-03-22 DIAGNOSIS — J9610 Chronic respiratory failure, unspecified whether with hypoxia or hypercapnia: Secondary | ICD-10-CM | POA: Diagnosis not present

## 2021-03-22 DIAGNOSIS — E039 Hypothyroidism, unspecified: Secondary | ICD-10-CM | POA: Diagnosis not present

## 2021-03-22 DIAGNOSIS — R633 Feeding difficulties, unspecified: Secondary | ICD-10-CM

## 2021-03-22 DIAGNOSIS — M86172 Other acute osteomyelitis, left ankle and foot: Secondary | ICD-10-CM | POA: Diagnosis not present

## 2021-03-22 DIAGNOSIS — E1142 Type 2 diabetes mellitus with diabetic polyneuropathy: Secondary | ICD-10-CM | POA: Diagnosis not present

## 2021-03-22 NOTE — Telephone Encounter (Signed)
Patient doesn't have NOVOLOG on medication list but is requesting refill. Medication pend and sent to PCP [name] [name], NP

## 2021-03-23 ENCOUNTER — Ambulatory Visit (HOSPITAL_COMMUNITY)
Admission: RE | Admit: 2021-03-23 | Discharge: 2021-03-23 | Disposition: A | Discharge status: Home / Self Care | Payer: Medicare Other | Source: Ambulatory Visit | Attending: Interventional Radiology | Admitting: Interventional Radiology

## 2021-03-23 ENCOUNTER — Other Ambulatory Visit: Payer: Self-pay

## 2021-03-23 DIAGNOSIS — Z431 Encounter for attention to gastrostomy: Secondary | ICD-10-CM | POA: Diagnosis not present

## 2021-03-23 DIAGNOSIS — K9413 Enterostomy malfunction: Secondary | ICD-10-CM | POA: Diagnosis not present

## 2021-03-23 DIAGNOSIS — R633 Feeding difficulties, unspecified: Secondary | ICD-10-CM | POA: Insufficient documentation

## 2021-03-23 HISTORY — PX: IR GJ TUBE CHANGE: IMG1440

## 2021-03-23 MED ORDER — LIDOCAINE VISCOUS HCL 2 % MT SOLN
OROMUCOSAL | Status: DC | PRN
  Administered 2021-03-23: 15 mL via OROMUCOSAL

## 2021-03-23 MED ORDER — IOHEXOL 300 MG/ML  SOLN
25.0000 mL | Freq: Once | INTRAMUSCULAR | Status: AC | PRN
  Administered 2021-03-23: 16:00:00 10 mL

## 2021-03-23 MED ORDER — LIDOCAINE VISCOUS HCL 2 % MT SOLN
OROMUCOSAL | Status: AC
  Filled 2021-03-23: qty 15

## 2021-03-24 DIAGNOSIS — J9610 Chronic respiratory failure, unspecified whether with hypoxia or hypercapnia: Secondary | ICD-10-CM | POA: Diagnosis not present

## 2021-03-24 DIAGNOSIS — E1142 Type 2 diabetes mellitus with diabetic polyneuropathy: Secondary | ICD-10-CM | POA: Diagnosis not present

## 2021-03-24 DIAGNOSIS — E039 Hypothyroidism, unspecified: Secondary | ICD-10-CM | POA: Diagnosis not present

## 2021-03-24 DIAGNOSIS — E1151 Type 2 diabetes mellitus with diabetic peripheral angiopathy without gangrene: Secondary | ICD-10-CM | POA: Diagnosis not present

## 2021-03-24 DIAGNOSIS — E1169 Type 2 diabetes mellitus with other specified complication: Secondary | ICD-10-CM | POA: Diagnosis not present

## 2021-03-24 DIAGNOSIS — M86172 Other acute osteomyelitis, left ankle and foot: Secondary | ICD-10-CM | POA: Diagnosis not present

## 2021-03-29 DIAGNOSIS — E1169 Type 2 diabetes mellitus with other specified complication: Secondary | ICD-10-CM | POA: Diagnosis not present

## 2021-03-29 DIAGNOSIS — M86172 Other acute osteomyelitis, left ankle and foot: Secondary | ICD-10-CM | POA: Diagnosis not present

## 2021-03-29 DIAGNOSIS — E039 Hypothyroidism, unspecified: Secondary | ICD-10-CM | POA: Diagnosis not present

## 2021-03-29 DIAGNOSIS — J9610 Chronic respiratory failure, unspecified whether with hypoxia or hypercapnia: Secondary | ICD-10-CM | POA: Diagnosis not present

## 2021-03-29 DIAGNOSIS — E1142 Type 2 diabetes mellitus with diabetic polyneuropathy: Secondary | ICD-10-CM | POA: Diagnosis not present

## 2021-03-29 DIAGNOSIS — E1151 Type 2 diabetes mellitus with diabetic peripheral angiopathy without gangrene: Secondary | ICD-10-CM | POA: Diagnosis not present

## 2021-03-30 ENCOUNTER — Other Ambulatory Visit: Payer: Self-pay | Admitting: Nurse Practitioner

## 2021-03-31 ENCOUNTER — Telehealth: Payer: Self-pay | Admitting: *Deleted

## 2021-03-31 DIAGNOSIS — E1169 Type 2 diabetes mellitus with other specified complication: Secondary | ICD-10-CM | POA: Diagnosis not present

## 2021-03-31 DIAGNOSIS — E039 Hypothyroidism, unspecified: Secondary | ICD-10-CM | POA: Diagnosis not present

## 2021-03-31 DIAGNOSIS — E1151 Type 2 diabetes mellitus with diabetic peripheral angiopathy without gangrene: Secondary | ICD-10-CM | POA: Diagnosis not present

## 2021-03-31 DIAGNOSIS — J9610 Chronic respiratory failure, unspecified whether with hypoxia or hypercapnia: Secondary | ICD-10-CM | POA: Diagnosis not present

## 2021-03-31 DIAGNOSIS — M86172 Other acute osteomyelitis, left ankle and foot: Secondary | ICD-10-CM | POA: Diagnosis not present

## 2021-03-31 DIAGNOSIS — E1142 Type 2 diabetes mellitus with diabetic polyneuropathy: Secondary | ICD-10-CM | POA: Diagnosis not present

## 2021-03-31 NOTE — Telephone Encounter (Signed)
We discussed this and due to her low A1c and risk of hypoglycemia I dc'd at a previous visit. She can still monitor her blood sugars at home but I would like to stop all insulin.

## 2021-03-31 NOTE — Telephone Encounter (Signed)
[name], Caregiver called and stated that Rx was denied for patients' Insulin Novolog.   Medication is not in patient's current medication list. Patient is still taking it as needed and would like it added back and sent to the pharmacy.   Novolog Sig: INJECT 0-6 UNITS INTO THE SKIN 3 (THREE) TIMES DAILY WITH MEALS. NEW SLIDING SCALE IF BS IS 151 - 251 = 2 UNITS, IF BS IS 251 - 350 = 4 UNITS, IF BS IS 351 - 450 = 6 UNITS CALL PROVIDER IF BS RUNS MORE THAN 400   Please Advise.

## 2021-03-31 NOTE — Telephone Encounter (Signed)
Shannon [name] notified and agreed.

## 2021-03-31 NOTE — Telephone Encounter (Signed)
LMOM to return call.

## 2021-04-07 DIAGNOSIS — E039 Hypothyroidism, unspecified: Secondary | ICD-10-CM | POA: Diagnosis not present

## 2021-04-07 DIAGNOSIS — E1151 Type 2 diabetes mellitus with diabetic peripheral angiopathy without gangrene: Secondary | ICD-10-CM | POA: Diagnosis not present

## 2021-04-07 DIAGNOSIS — E1142 Type 2 diabetes mellitus with diabetic polyneuropathy: Secondary | ICD-10-CM | POA: Diagnosis not present

## 2021-04-07 DIAGNOSIS — J9610 Chronic respiratory failure, unspecified whether with hypoxia or hypercapnia: Secondary | ICD-10-CM | POA: Diagnosis not present

## 2021-04-07 DIAGNOSIS — M86172 Other acute osteomyelitis, left ankle and foot: Secondary | ICD-10-CM | POA: Diagnosis not present

## 2021-04-07 DIAGNOSIS — E1169 Type 2 diabetes mellitus with other specified complication: Secondary | ICD-10-CM | POA: Diagnosis not present

## 2021-04-12 ENCOUNTER — Telehealth: Payer: Self-pay | Admitting: *Deleted

## 2021-04-12 ENCOUNTER — Other Ambulatory Visit: Payer: Self-pay | Admitting: Podiatry

## 2021-04-12 DIAGNOSIS — E1169 Type 2 diabetes mellitus with other specified complication: Secondary | ICD-10-CM | POA: Diagnosis not present

## 2021-04-12 DIAGNOSIS — E039 Hypothyroidism, unspecified: Secondary | ICD-10-CM | POA: Diagnosis not present

## 2021-04-12 DIAGNOSIS — E1151 Type 2 diabetes mellitus with diabetic peripheral angiopathy without gangrene: Secondary | ICD-10-CM | POA: Diagnosis not present

## 2021-04-12 DIAGNOSIS — E1142 Type 2 diabetes mellitus with diabetic polyneuropathy: Secondary | ICD-10-CM | POA: Diagnosis not present

## 2021-04-12 DIAGNOSIS — J9610 Chronic respiratory failure, unspecified whether with hypoxia or hypercapnia: Secondary | ICD-10-CM | POA: Diagnosis not present

## 2021-04-12 DIAGNOSIS — M86172 Other acute osteomyelitis, left ankle and foot: Secondary | ICD-10-CM | POA: Diagnosis not present

## 2021-04-12 MED ORDER — DOXYCYCLINE HYCLATE 100 MG PO TABS: 100.0000 mg | ORAL_TABLET | Freq: Two times a day (BID) | ORAL | 0 refills | Status: DC

## 2021-04-12 NOTE — Telephone Encounter (Signed)
[place] Nurse called and stated that she is with patient now.  Stated that patient has a temperature of 100.3, Blood Sugar 192, Pulse 100, BP 130/62 SpO293%. Patient is not feeling well.   No UTI Sx, No head Congestion. Stated Right foot hurts.   Nurse unwrapped foot and Right Pinkie toe is tunneling and bloody discharge. No Pus No Odor.   Patient has an appointment tomorrow with Podiatrist, Dr. Amalia Hailey but is wondering if you would call her in an antibiotic today.   Stated that she finished antibiotic 2 weeks ago and was doing really well on it.   Also wants  to know how you want the wound dressed.   Please Advise.

## 2021-04-12 NOTE — Telephone Encounter (Signed)
Leata Mouse [place] [phone] calling because the patient's is having quite a bit of drainage,blood coming from amputation site,just finished a course of antibiotics, temp:100.3,B/ p: 130/62,Blood sugar: 192 ,has called patient's PCP, and she suggested that they reach out to Dr Amalia Hailey.  I spoke with the nurse and suggested that patient go to ER to be re evaluated and will also send message to her doctor.

## 2021-04-12 NOTE — Telephone Encounter (Signed)
If she feels like the infection is coming from the foot I would recommend calling the podiatrist for recommendation since he is treating. Likely will need work-up of that.

## 2021-04-12 NOTE — Telephone Encounter (Signed)
Per Dr. Amalia Hailey, he spoke with daughter [name] and sent in another rx for Doxycycline and instructed her to go to ED if symptoms become worse.  If not keep appt for tomorrow in [place] with Dr. Amalia Hailey

## 2021-04-12 NOTE — Telephone Encounter (Signed)
Lenora notified and agreed. Stated she will call the Podiatrist.

## 2021-04-13 ENCOUNTER — Other Ambulatory Visit: Payer: Self-pay

## 2021-04-13 ENCOUNTER — Ambulatory Visit (INDEPENDENT_AMBULATORY_CARE_PROVIDER_SITE_OTHER): Payer: Medicare Other | Admitting: Podiatry

## 2021-04-13 DIAGNOSIS — L97512 Non-pressure chronic ulcer of other part of right foot with fat layer exposed: Secondary | ICD-10-CM | POA: Diagnosis not present

## 2021-04-13 DIAGNOSIS — M86671 Other chronic osteomyelitis, right ankle and foot: Secondary | ICD-10-CM

## 2021-04-13 DIAGNOSIS — E0859 Diabetes mellitus due to underlying condition with other circulatory complications: Secondary | ICD-10-CM

## 2021-04-13 MED ORDER — DOXYCYCLINE HYCLATE 100 MG PO TABS: 100.0000 mg | ORAL_TABLET | Freq: Every day | ORAL | 1 refills | Status: DC

## 2021-04-13 MED ORDER — DOXYCYCLINE HYCLATE 100 MG PO TABS: 100.0000 mg | ORAL_TABLET | Freq: Two times a day (BID) | ORAL | 1 refills | Status: DC

## 2021-04-13 NOTE — Progress Notes (Signed)
Subjective:  Patient status post bone biopsy with debridement of wounds to the bilateral feet.  DOS: 12/26/2020.  Patient states that her foot was doing very well when she was having nurses come to the house for dressing changes.  Since the nurses discontinue the dressing changes the wounds have slightly deteriorated.  She presents for her monthly follow-up  Past Medical History:  Diagnosis Date   Acute respiratory failure ([place])    Anemia    previous blood transfusions   Arthritis    "all over"   Asthma    Atrial flutter (HCC)    Bradycardia    requiring previous d/c of BB and reduction of amiodarone   CAD (coronary artery disease)    nonobstructive per notes   Chronic diastolic CHF (congestive heart failure) ([place])    CKD (chronic kidney disease), stage III ([place])    Complication of blood transfusion    "got the wrong blood type at [facility] in ~ 2015; no adverse reaction that we are aware of"/daughter, [name] (01/27/2016)   COPD (chronic obstructive pulmonary disease) ([place])    Depression    "light case"   DVT (deep venous thrombosis) ([place]) 01/2016   a. LLE DVT 01/2016 - switched from Eliquis to Coumadin.   Dyspnea    with some activity   Gastric stenosis    a. s/p stomach tube   GERD (gastroesophageal reflux disease)    GI bleed    Headache    History of 2019 novel coronavirus disease (COVID-19)    History of blood transfusion    "several" (01/27/2016)   History of stomach ulcers    Hyperlipidemia    Hypertension    Hypothyroidism    On home oxygen therapy    "2L; 9PM til 9AM" (06/27/2017)   PAD (peripheral artery disease) (HCC)    a. prior gangrene, toe amputations, intervention.   PAF (paroxysmal atrial fibrillation) ([place])    Paraesophageal hernia    Perforated gastric ulcer ([place])    Pneumonia    "a few times" (06/27/2017)   Seasonal allergies    SIADH (syndrome of inappropriate ADH production) ([place])    [name] 01/10/2015   Small bowel obstruction ([place])    "I  don't know how many" (01/11/2015)   Stroke ([place])    several- no residual   Type II diabetes mellitus ([place])    "related to prednisone use  for > 20 yr; once predinose stopped; no more DM RX" (01/27/2016)   UTI (urinary tract infection) 02/08/2016   Ventral hernia with bowel obstruction          Objective: Physical Exam General: The patient is alert and oriented x3 in no acute distress.  Dermatology: Ulcers noted to the right plantar forefoot amputation stump.  Please see attached photo.  No malodor.  Moderate serosanguineous drainage noted.  Vascular: Patient recently underwent balloon angioplasty with vein and vascular.  Lower extremity angiography 12/24/2020  VAS [place] ABI W/WO TBI 09/10/2020 Right: Resting right ankle-brachial index is within normal range using the  posterior tibial artery . No evidence of significant right lower extremity  arterial disease.   Left: Resting left ankle-brachial index indicates noncompressible left  lower extremity arteries. Unable to obtain left great toe index due to  bandage.   Neurological: Epicritic and protective threshold diminished bilaterally.   Musculoskeletal Exam: Status post TMA right foot.  Hallux valgus left with history of overlying ulcer and degenerative changes possible exposed bone and joint capsule.  Second and third  toe amputation left.  Radiographic exam 12/13/2020: There continues to be left foot demonstration of history of left second and third toe amputation with resection of the metatarsal heads as well and hallux valgus deformity.  Diffuse degenerative changes noted.  No gas within the tissue.  Cortical erosive changes noted to the medial aspect of the first metatarsal head just underlying the area of the wound which is consistent with osteomyelitis of the distal portion of the first metatarsal.  Right foot demonstrates ulcer at the distal stump of the second metatarsal with some osteolytic changes at the distal portion of  the metatarsal consistent with findings of osteomyelitis  Assessment: 1. s/p transmetatarsal amputation right foot. DOS: 11/14/2019 2.  H/o 2nd toe amputation left 3.  Chronic ulcers bilateral feet 4.  Chronic osteomyelitis bilateral feet  Plan of Care:  1. Patient was evaluated.   2.  Continue home nurse dressing changes 2 times weekly.  They have been applying Betadine and Aquacel to the wounds with dry dressings 3.  Yesterday prescription for doxycycline 100 mg 2 times daily was sent to the pharmacy.  We will continue this for now.  It was only prescribed for 7 days.  Refill provided for doxycycline 100 mg daily 4.  For now we are going to continue to pursue conservative palliative care for the patient.  Patient does not want any additional surgery to the feet.  Both the patient and daughter understand current situation of the chronic osteomyelitis and associated risks 5.  Return to clinic 4 weeks  Edrick Kins, DPM [facility]  Dr. Edrick Kins, DPM    [address], [place] [address]                Office [phone]  Fax [phone]

## 2021-04-14 DIAGNOSIS — I4819 Other persistent atrial fibrillation: Secondary | ICD-10-CM | POA: Diagnosis not present

## 2021-04-14 DIAGNOSIS — E1169 Type 2 diabetes mellitus with other specified complication: Secondary | ICD-10-CM | POA: Diagnosis not present

## 2021-04-14 DIAGNOSIS — I130 Hypertensive heart and chronic kidney disease with heart failure and stage 1 through stage 4 chronic kidney disease, or unspecified chronic kidney disease: Secondary | ICD-10-CM | POA: Diagnosis not present

## 2021-04-14 DIAGNOSIS — M86172 Other acute osteomyelitis, left ankle and foot: Secondary | ICD-10-CM | POA: Diagnosis not present

## 2021-04-14 DIAGNOSIS — E039 Hypothyroidism, unspecified: Secondary | ICD-10-CM | POA: Diagnosis not present

## 2021-04-14 DIAGNOSIS — K285 Chronic or unspecified gastrojejunal ulcer with perforation: Secondary | ICD-10-CM | POA: Diagnosis not present

## 2021-04-14 DIAGNOSIS — G8929 Other chronic pain: Secondary | ICD-10-CM | POA: Diagnosis not present

## 2021-04-14 DIAGNOSIS — M150 Primary generalized (osteo)arthritis: Secondary | ICD-10-CM | POA: Diagnosis not present

## 2021-04-14 DIAGNOSIS — F325 Major depressive disorder, single episode, in full remission: Secondary | ICD-10-CM | POA: Diagnosis not present

## 2021-04-14 DIAGNOSIS — I4892 Unspecified atrial flutter: Secondary | ICD-10-CM | POA: Diagnosis not present

## 2021-04-14 DIAGNOSIS — E1142 Type 2 diabetes mellitus with diabetic polyneuropathy: Secondary | ICD-10-CM | POA: Diagnosis not present

## 2021-04-14 DIAGNOSIS — J449 Chronic obstructive pulmonary disease, unspecified: Secondary | ICD-10-CM | POA: Diagnosis not present

## 2021-04-14 DIAGNOSIS — K5900 Constipation, unspecified: Secondary | ICD-10-CM | POA: Diagnosis not present

## 2021-04-14 DIAGNOSIS — L89322 Pressure ulcer of left buttock, stage 2: Secondary | ICD-10-CM | POA: Diagnosis not present

## 2021-04-14 DIAGNOSIS — D631 Anemia in chronic kidney disease: Secondary | ICD-10-CM | POA: Diagnosis not present

## 2021-04-14 DIAGNOSIS — K219 Gastro-esophageal reflux disease without esophagitis: Secondary | ICD-10-CM | POA: Diagnosis not present

## 2021-04-14 DIAGNOSIS — N184 Chronic kidney disease, stage 4 (severe): Secondary | ICD-10-CM | POA: Diagnosis not present

## 2021-04-14 DIAGNOSIS — K439 Ventral hernia without obstruction or gangrene: Secondary | ICD-10-CM | POA: Diagnosis not present

## 2021-04-14 DIAGNOSIS — E1122 Type 2 diabetes mellitus with diabetic chronic kidney disease: Secondary | ICD-10-CM | POA: Diagnosis not present

## 2021-04-14 DIAGNOSIS — J9610 Chronic respiratory failure, unspecified whether with hypoxia or hypercapnia: Secondary | ICD-10-CM | POA: Diagnosis not present

## 2021-04-14 DIAGNOSIS — K311 Adult hypertrophic pyloric stenosis: Secondary | ICD-10-CM | POA: Diagnosis not present

## 2021-04-14 DIAGNOSIS — I5033 Acute on chronic diastolic (congestive) heart failure: Secondary | ICD-10-CM | POA: Diagnosis not present

## 2021-04-14 DIAGNOSIS — E785 Hyperlipidemia, unspecified: Secondary | ICD-10-CM | POA: Diagnosis not present

## 2021-04-14 DIAGNOSIS — E1151 Type 2 diabetes mellitus with diabetic peripheral angiopathy without gangrene: Secondary | ICD-10-CM | POA: Diagnosis not present

## 2021-04-14 DIAGNOSIS — I2510 Atherosclerotic heart disease of native coronary artery without angina pectoris: Secondary | ICD-10-CM | POA: Diagnosis not present

## 2021-04-18 ENCOUNTER — Ambulatory Visit: Payer: Medicare Other | Admitting: *Deleted

## 2021-04-20 ENCOUNTER — Other Ambulatory Visit: Payer: Self-pay | Admitting: *Deleted

## 2021-04-20 ENCOUNTER — Other Ambulatory Visit: Payer: Self-pay | Admitting: Nurse Practitioner

## 2021-04-20 DIAGNOSIS — E039 Hypothyroidism, unspecified: Secondary | ICD-10-CM | POA: Diagnosis not present

## 2021-04-20 DIAGNOSIS — E1169 Type 2 diabetes mellitus with other specified complication: Secondary | ICD-10-CM | POA: Diagnosis not present

## 2021-04-20 DIAGNOSIS — M86172 Other acute osteomyelitis, left ankle and foot: Secondary | ICD-10-CM | POA: Diagnosis not present

## 2021-04-20 DIAGNOSIS — E1151 Type 2 diabetes mellitus with diabetic peripheral angiopathy without gangrene: Secondary | ICD-10-CM | POA: Diagnosis not present

## 2021-04-20 DIAGNOSIS — E1142 Type 2 diabetes mellitus with diabetic polyneuropathy: Secondary | ICD-10-CM | POA: Diagnosis not present

## 2021-04-20 DIAGNOSIS — J9610 Chronic respiratory failure, unspecified whether with hypoxia or hypercapnia: Secondary | ICD-10-CM | POA: Diagnosis not present

## 2021-04-20 MED ORDER — HYDRALAZINE HCL 50 MG PO TABS: 50.0000 mg | ORAL_TABLET | Freq: Three times a day (TID) | ORAL | 0 refills | Status: DC

## 2021-04-21 ENCOUNTER — Other Ambulatory Visit: Payer: Self-pay | Admitting: *Deleted

## 2021-04-21 NOTE — Patient Outreach (Signed)
[facility]) Care Management  04/21/2021  Shannon [name] 1924/03/09 [id number]   Outgoing call placed to member, unsuccessful, mailbox full.  Will follow up within the next 3-4 business days.  [name], RN, MSN, [place] Manager [phone]

## 2021-04-21 NOTE — Patient Outreach (Signed)
[facility]) Care Management  [facility] Care Manager  04/21/2021   Shannon [name] 10-Feb-1924 [id number]   Incoming call received back from member.  Denies any urgent concerns, encouraged to contact this care manager with questions.    Encounter Medications:  Outpatient Encounter Medications as of 04/21/2021  Medication Sig Note   acetaminophen (TYLENOL) 500 MG tablet Take 500 mg by mouth every 6 (six) hours as needed for moderate pain.    amLODipine (NORVASC) 5 MG tablet TAKE 1 TABLET BY MOUTH EVERY DAY    aspirin 81 MG EC tablet Take 1 tablet (81 mg total) by mouth daily. Swallow whole.    atorvastatin (LIPITOR) 20 MG tablet Take 0.5 tablets (10 mg total) by mouth daily.    b complex vitamins tablet Take 1 tablet by mouth daily.     cholecalciferol (VITAMIN D3) 25 MCG (1000 UNIT) tablet Take 1,000 Units by mouth daily.    cycloSPORINE (RESTASIS) 0.05 % ophthalmic emulsion USE 1 DROP INTO BOTH EYES TWICE DAILY    doxycycline (VIBRA-TABS) 100 MG tablet Take 1 tablet (100 mg total) by mouth daily.    DULoxetine (CYMBALTA) 30 MG capsule TAKE 1 CAPSULE BY MOUTH EVERY DAY    ferrous sulfate 220 (44 Fe) MG/5ML solution TAKE 7.4 MLS (325 MG TOTAL) BY MOUTH DAILY. (Patient taking differently: Place 220 mg into feeding tube daily.) 02/15/2021: Only med pt takes through tube   fluticasone (FLONASE) 50 MCG/ACT nasal spray 2 sprays into each nostril daily.    furosemide (LASIX) 40 MG tablet TAKE 0.5-1 TABLETS (20-40 MG TOTAL) BY MOUTH SEE ADMIN INSTRUCTIONS. TAKE 20 MG DAILY, MAY INSTEAD TAKE 40 MG DAILY AS NEEDED FOR EDEMA    gabapentin (NEURONTIN) 300 MG capsule TAKE 1 CAPSULE BY MOUTH TWICE A DAY    gentamicin cream (GARAMYCIN) 0.1 % Apply 1 application topically daily as needed (dry skin).    hydrALAZINE (APRESOLINE) 50 MG tablet Take 1 tablet (50 mg total) by mouth 3 (three) times daily.    Insulin Pen Needle (B-D UF III MINI PEN NEEDLES) 31G X 5 MM MISC Use twice daily with giving  insulin injections. Dx E11.9    latanoprost (XALATAN) 0.005 % ophthalmic solution Place 1 drop into both eyes at bedtime.    levothyroxine (SYNTHROID) 125 MCG tablet TAKE 1 TABLET BY MOUTH EVERY DAY    liver oil-zinc oxide (DESITIN) 40 % ointment Apply topically 2 (two) times daily.    loratadine (CLARITIN) 10 MG tablet Take 10 mg by mouth daily.    mometasone-formoterol (DULERA) 200-5 MCG/ACT AERO INHALE TWO PUFFS BY MOUTH TWICE DAILY 02/14/2021: LF at [facility] on 12/01/20. 30 Day Supply    Multiple Vitamin (MULTIVITAMIN WITH MINERALS) TABS tablet Take 1 tablet by mouth daily.    Nutritional Supplements (FEEDING SUPPLEMENT, OSMOLITE 1.5 CAL,) LIQD Place 711 mLs into feeding tube See admin instructions. Take 711 ml over a 12 hour period per day 9pm-9am at a rate of 65    nystatin cream (MYCOSTATIN) APPLY TO AFFECTED AREA TWICE A DAY    oxyCODONE (ROXICODONE) 5 MG immediate release tablet Take 0.5-1 tablets (2.5-5 mg total) by mouth every 8 (eight) hours as needed for severe pain.    pantoprazole (PROTONIX) 40 MG tablet Take 1 tablet (40 mg total) by mouth 2 (two) times daily.    Polyethyl Glycol-Propyl Glycol (SYSTANE) 0.4-0.3 % GEL ophthalmic gel Place 1 application into both eyes 2 (two) times daily as needed (moisture).    Probiotic Product (EQL  PROBIOTIC COLON SUPPORT PO) Take 1 capsule by mouth daily.    senna-docusate (SENOKOT-S) 8.6-50 MG tablet Take 1 tablet by mouth at bedtime.    Sodium Chloride-Sodium Bicarb (NETI POT SINUS WASH NA) Place 1 application into the nose at bedtime. 02/15/2021: Every night, continuous   sucralfate (CARAFATE) 1 GM/10ML suspension Take 10 mLs (1 g total) by mouth every 6 (six) hours.    traMADol (ULTRAM) 50 MG tablet Place 1 tablet (50 mg total) into feeding tube every 6 (six) hours as needed for severe pain.    Water For Irrigation, Sterile (FREE WATER) SOLN Place 180 mLs into feeding tube 3 (three) times daily.    White Petrolatum-Mineral Oil (SYSTANE NIGHTTIME)  OINT Place 1 application into both eyes at bedtime.     No facility-administered encounter medications on file as of 04/21/2021.    Functional Status:  In your present state of health, do you have any difficulty performing the following activities: 01/13/2021 12/29/2020  Hearing? N N  Vision? N N  Difficulty concentrating or making decisions? N N  Walking or climbing stairs? Y Y  Dressing or bathing? N Y  Doing errands, shopping? Malvin Johns  Preparing Food and eating ? N -  Using the Toilet? N -  In the past six months, have you accidently leaked urine? Y -  Do you have problems with loss of bowel control? N -  Managing your Medications? Y -  Managing your Finances? Y -  Housekeeping or managing your Housekeeping? Y -  Some recent data might be hidden    Fall/Depression Screening: Fall Risk  03/09/2021 01/13/2021 01/06/2021  Falls in the past year? 0 0 0  Number falls in past yr: 0 0 0  Injury with Fall? 0 0 0  Risk for fall due to : No Fall Risks No Fall Risks No Fall Risks  Follow up Falls evaluation completed Falls evaluation completed Falls evaluation completed   PHQ 2/9 Scores 01/13/2021 10/21/2019 07/21/2019 10/10/2018 08/07/2018 10/05/2017 02/28/2017  PHQ - 2 Score 0 0 0 0 0 0 0  Exception Documentation - - - - - - -  Not completed - - - - - - -    Assessment:   Care Plan Care Plan : General Plan of Care (Adult)  Updates made by Kemper Durie, RN since 04/21/2021 12:00 AM     Problem: Difficulty managing chronic medical conditions (CHF, DM, HTN, PVD)   Priority: High     Long-Range Goal: Member and family will report adequate management of chronic health conditions (CHF, DM, HTN, PVD)   Start Date: 02/21/2021  Expected End Date: 08/21/2021  This Visit's Progress: On track  Recent Progress: On track  Priority: High  Note:   Current Barriers:  Chronic Disease Management support and education needs related to CHF, HTN, DMII, and PVD  RNCM Clinical Goal(s):  Patient will  verbalize understanding of plan for management of CHF, HTN, DMII, and PVD as evidenced by member/family verbalizing stability in member's status take all medications exactly as prescribed and will call provider for medication related questions as evidenced by family reporting adherence    attend all scheduled medical appointments: PCP and Podiatry as evidenced by record of attendance        continue to work with RN Care Manager and/or Social Worker to address care management and care coordination needs related to CHF, HTN, DMII, and PVD as evidenced by adherence to CM Team Scheduled appointments     not experience  hospital admission as evidenced by review of EMR. Hospital Admissions in last 6 months = 4  through collaboration with RN Care manager, provider, and care team.   Interventions: Inter-disciplinary care team collaboration (see longitudinal plan of care) Evaluation of current treatment plan related to  self management and patient's adherence to plan as established by provider   Heart Failure Interventions:  (Status: Goal on Track (progressing): YES.)  Long Term Goal  Basic overview and discussion of pathophysiology of Heart Failure reviewed Provided education on low sodium diet Advised patient to weigh each morning after emptying bladder Discussed importance of daily weight and advised patient to weigh and record daily  Diabetes:  (Status: Goal on Track (progressing): YES.) Long Term Goal   Lab Results  Component Value Date   HGBA1C 5.5 11/04/2020  Assessed patient's understanding of A1c goal: <7% Discussed plans with patient for ongoing care management follow up and provided patient with direct contact information for care management team;      Provided patient with written educational materials related to hypo and hyperglycemia and importance of correct treatment;       Advised patient, providing education and rationale, to check cbg three times daily and record         PVD   (Status: Goal on track: NO.) Long Term Goal  Evaluation of current treatment plan related to  PVD ,  self-management and patient's adherence to plan as established by provider. Discussed plans with patient for ongoing care management follow up and provided patient with direct contact information for care management team Provided education to patient re: wound care; Reviewed scheduled/upcoming provider appointments including podiatry; Discussed plans with patient for ongoing care management follow up and provided patient with direct contact information for care management team;  Hypertension: (Status: Goal on Track (progressing): YES.) Last practice recorded BP readings:  BP Readings from Last 3 Encounters:  02/16/21 (!) 169/66  01/25/21 (!) 151/66  01/06/21 130/80  Most recent eGFR/CrCl:  Lab Results  Component Value Date   EGFR 36 (L) 01/06/2021    No components found for: CRCL  Provided education to patient re: stroke prevention, s/s of heart attack and stroke; Reviewed prescribed diet low salt Discussed plans with patient for ongoing care management follow up and provided patient with direct contact information for care management team; Advised patient, providing education and rationale, to monitor blood pressure daily and record, calling PCP for findings outside established parameters;   Patient Goals/Self-Care Activities: Take medications as prescribed   Attend all scheduled provider appointments Call provider office for new concerns or questions  watch for swelling in feet, ankles and legs every day weigh myself daily track symptoms and what helps feel better or worse check blood sugar at prescribed times: three times daily check feet daily for cuts, sores or redness enter blood sugar readings and medication or insulin into daily log take the blood sugar meter to all doctor visits   Update 11/22 - Along with above listed conditions, member also has had admissions for anemia  from GI bleed, recently discharged on 11/16.  Medications changed (Plavix stopped and Carafate started).  She will follow up with PCP on 12/7. Also continues to have wounds on feet post transmetatarsal amputations, follow up with podiatry on 12/5.  She is working to regain her strength, does not feel PT is needed.   Per daughter, she feel she could use more help now as member's condition has steadily declined over the last several months.  Will send list of agencies for in home aide assistance, daughter aware there is an out of pocket fee as member does not have Medicaid.   Update 12/19 - Member was seen by podiatrist on 12/5, continues to have non-healing foot wound with drainage, ongoing dressing changes.  She now has home health nursing restarted for wound care of foot as well as new sacral wound.  Educated on ways to alleviate pressure and decrease pain.  She was placed on antibiotics, wondering if she will need this reordered for longer term use.  She will have home health nurse examine it tomorrow and contact provider for orders if needed.  Will follow up with podiatrist on 1/11.  Was also seen by PCP on 12/7, follow up on 3/10.  Cardiology appointment scheduled for 2/1.  She report ongoing weight loss, now down to 120 pounds and decreased appetite.  She is working with her family on full liquids that she will be able to tolerate along with tube feeding.   Update 04/21/2021 - Member report feeling weak over the last week, HH nurse called PCP and podiatry office as she was showing signs of recurrent infection.  Foot wound still not healing, draining and tunneling per notes in chart.  Member was seen this week by podiatry, antibiotic was given to take indefinitely, member has decided not to proceed with further surgeries.  She will continue to have wound care in the home, does not feel PT would be beneficial as she state she is already starting to feel better  She will contact this care manager should she  change her mind. She will keep appointments with PCP, cardiology, and podiatry in the future.            Plan:  Follow-up: Patient agrees to Care Plan and Follow-up. Follow-up in 1 month(s).  Kemper Durie, RN, MSN, CCM [facility] Care Management  [facility] Manager [phone]

## 2021-04-25 ENCOUNTER — Telehealth: Payer: Self-pay | Admitting: *Deleted

## 2021-04-25 NOTE — Telephone Encounter (Signed)
[name], Caregiver called and stated that she tried to order patient's [employer] through [employer] and they are out of it due to [facility] through March.   Patient is wanting to know if you have any alternatives for nutrition until she can get it in March.   Please Advise.

## 2021-04-25 NOTE — Telephone Encounter (Signed)
Would need to ask the dietitian in regards to this.

## 2021-04-25 NOTE — Telephone Encounter (Signed)
[name] notified and agreed.

## 2021-04-26 IMAGING — CT CT HEAD WITHOUT CONTRAST
4 of 10 series · 14 of 47 positions shown, 15 images · non-contrast
Comparison: CT 11/02/2016, CT neck 02/28/2016

CLINICAL DATA: Syncope, fall and hit left side of head

EXAM:
CT HEAD WITHOUT CONTRAST
CT CERVICAL SPINE WITHOUT CONTRAST
TECHNIQUE: Multidetector CT imaging of the head and cervical spine was
performed following the standard protocol without intravenous
contrast. Multiplanar CT image reconstructions of the cervical spine
were also generated.

[Series 3: head wo · axial · 0.47mm/px · z∈[+1519,+1569]mm · 2 of 32 slices shown, 3 images]
[im 11/32  brain]
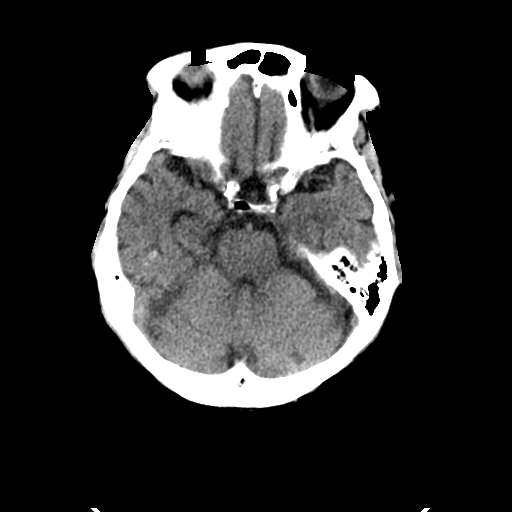
[im 11/32  bone]
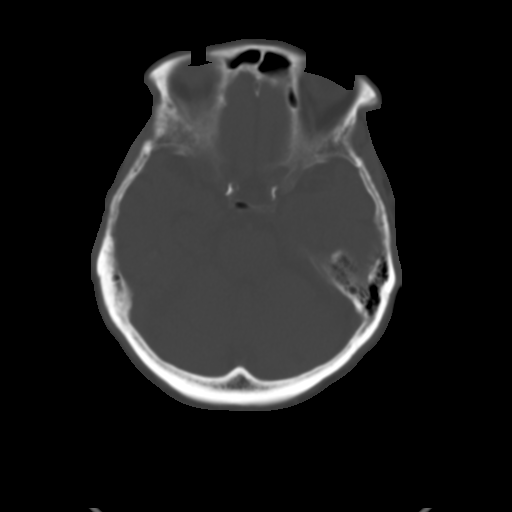
[im 21/32  brain]
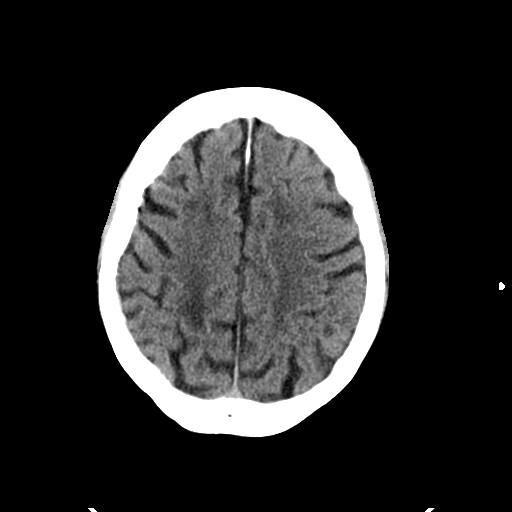

[Series 8: sagittal soft tissue · sagittal · 0.31mm/px · 1 of 57 slices shown]
[im 29/57  brain]
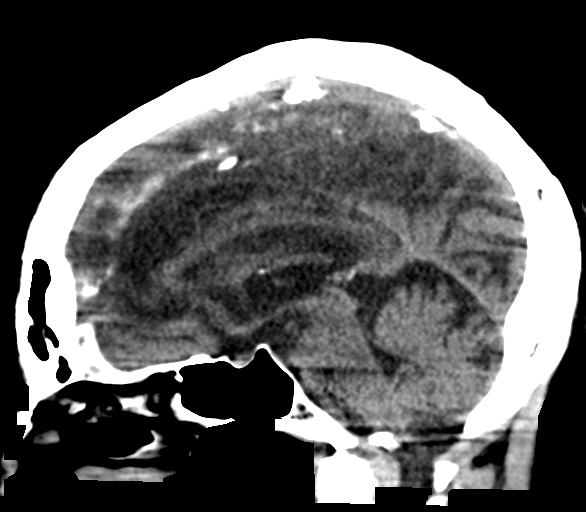

[Series 10: coronal soft tissue · coronal · 0.17mm/px · 3 of 61 slices shown]
[im 16/61  brain]
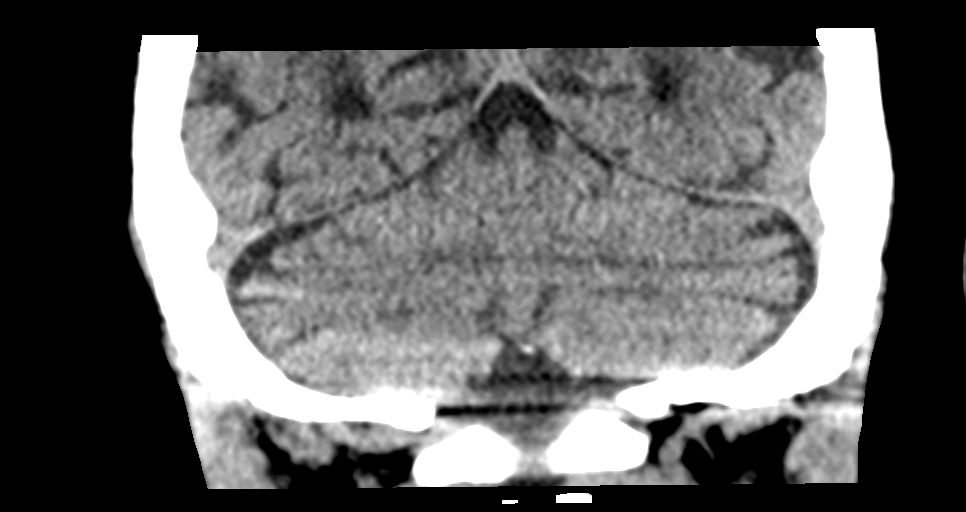
[im 31/61  brain]
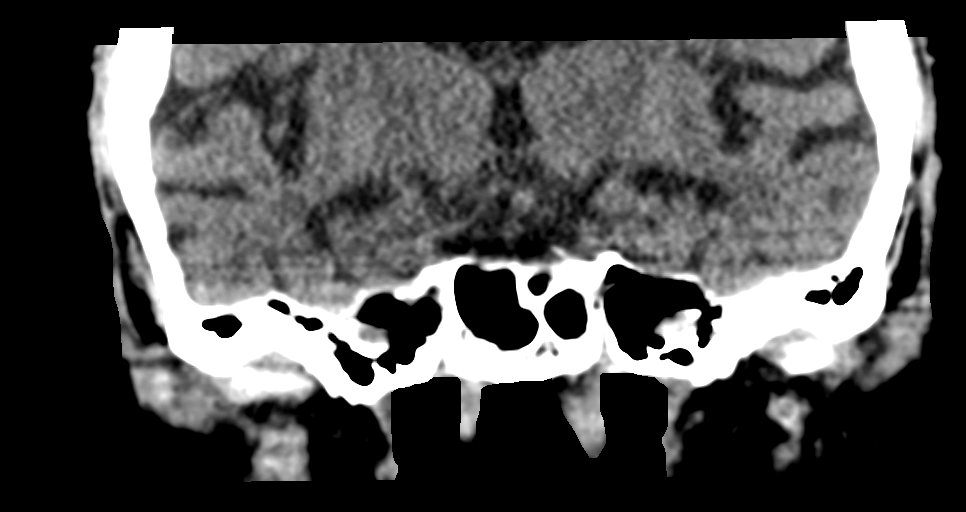
[im 46/61  brain]
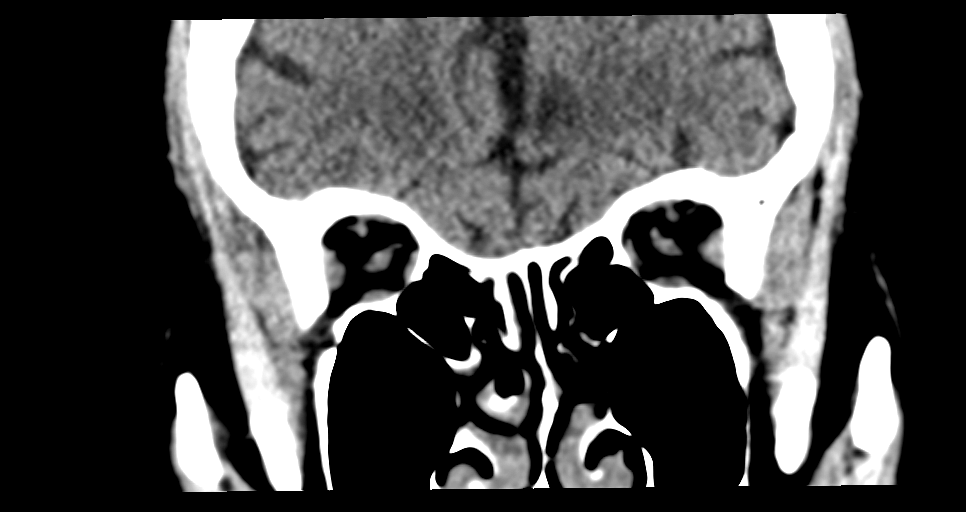

[Series 16: orthogonal bone · axial · 0.23mm/px · z∈[+1296,+1454]mm · 8 of 104 slices shown]
[im 10/104  bone]
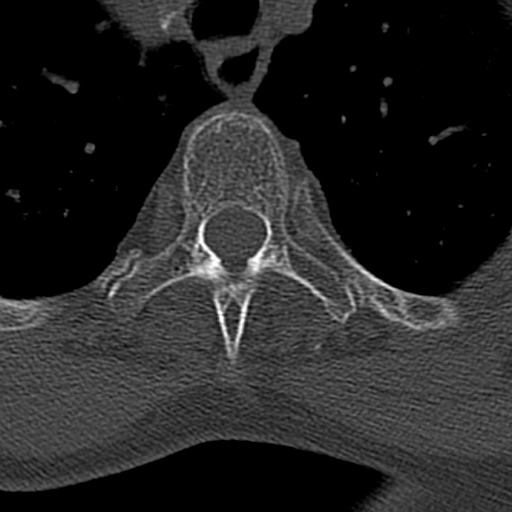
[im 19/104  bone]
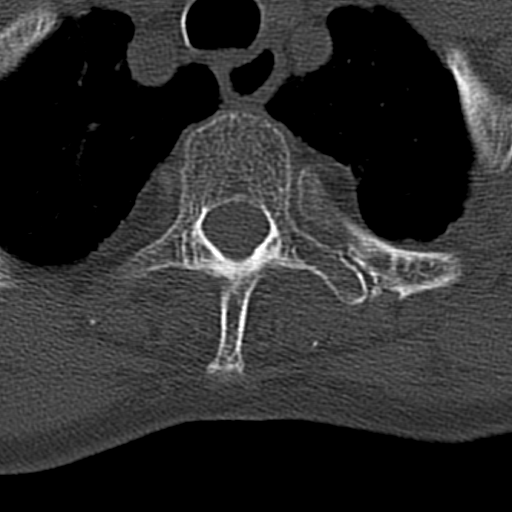
[im 38/104  bone]
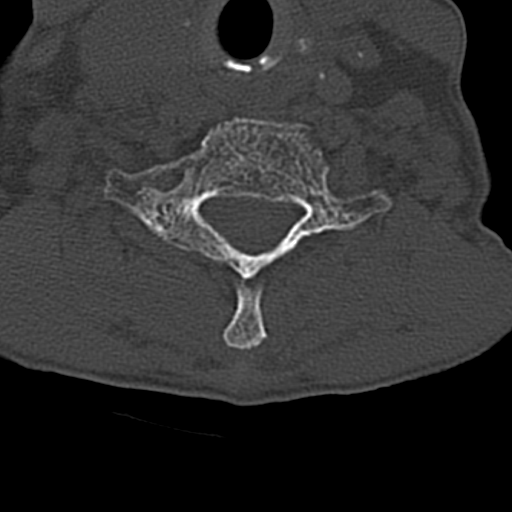
[im 47/104  bone]
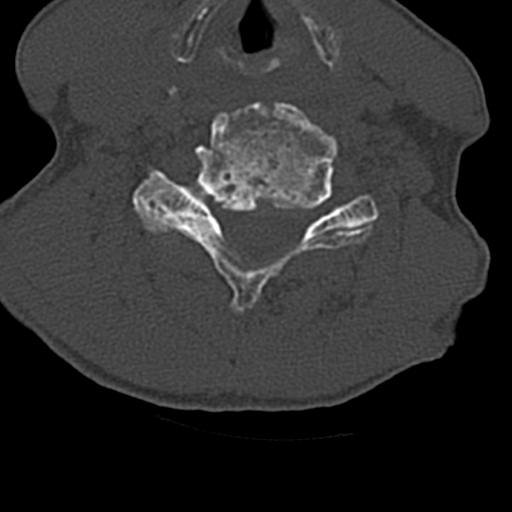
[im 57/104  bone]
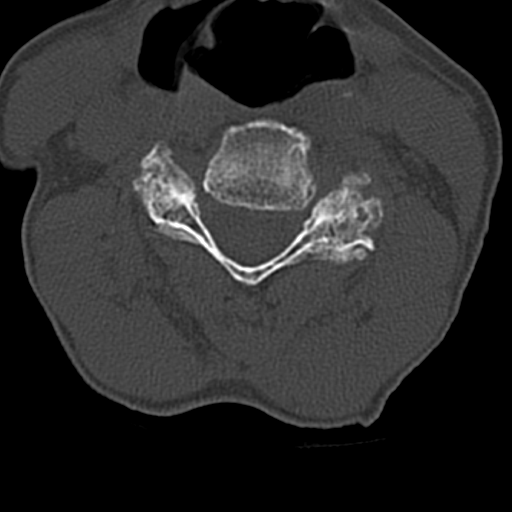
[im 66/104  bone]
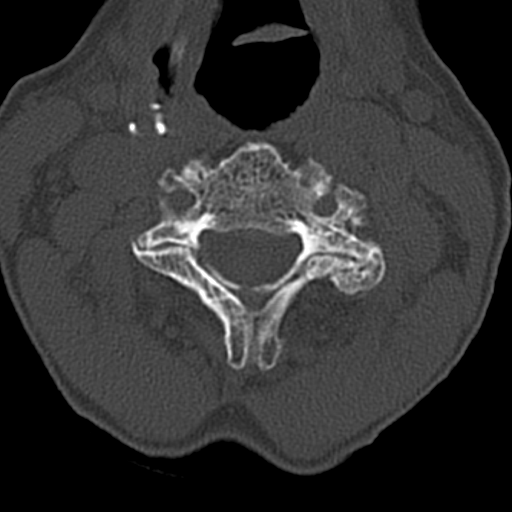
[im 85/104  bone]
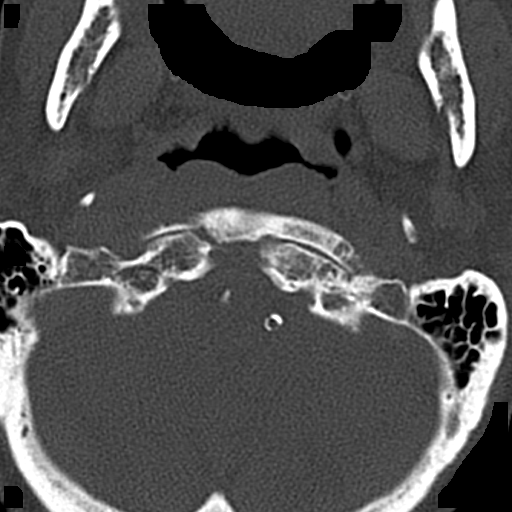
[im 94/104  bone]
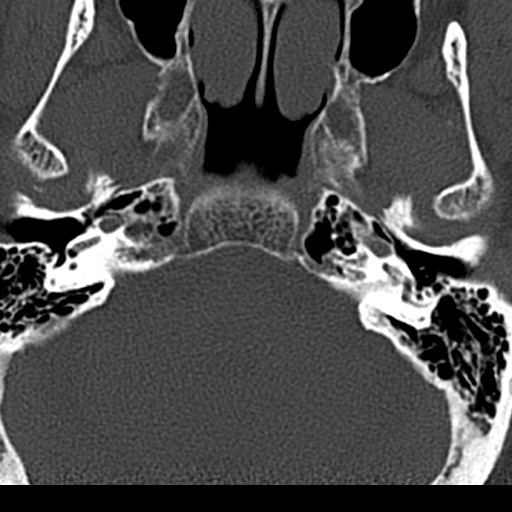

[14 of 47 positions shown; findings below may reference images not displayed]

FINDINGS: CT HEAD FINDINGS

Brain: No acute territorial infarction, hemorrhage or intracranial
mass. Chronic left cerebellar infarct. Atrophy and moderate to
severe hypodensity in the white matter consistent with small vessel
ischemic disease. Stable ventricle size.

Vascular: No hyperdense vessels. Carotid vascular and vertebral
artery calcification

Skull: Normal. Negative for fracture or focal lesion.

Sinuses/Orbits: Mucosal thickening in the sinuses

Other: None

CT CERVICAL SPINE FINDINGS

Alignment: Straightening of the cervical spine. No subluxation.
Facet alignment is normal

Skull base and vertebrae: Incomplete fusion posterior arch of C1. No
fracture. Erosive changes involving the dens, left anterior arch of
C1, and the vertebral body of C2. Prominent pannus at C1-C2
articulation.

Soft tissues and spinal canal: Prominent pannus at the C1-C2
articulation. No visible canal hematoma. Prevertebral soft tissues
within normal limits

Disc levels: Advanced degenerative changes at C3-C4, C5-C6 and
C6-C7. Multiple level facet degenerative change. Moderate severe
bilateral foraminal stenosis C3 through C7.

Upper chest: Negative.  Carotid vascular calcification.

Other: None
IMPRESSION: 1. No CT evidence for acute intracranial abnormality. Atrophy and
small vessel ischemic changes of the white matter. Chronic left
cerebellar infarct
2. Straightening of the cervical spine with multiple level
degenerative changes. No acute osseous abnormality
3. Erosive changes at the dens, anterior arch of C1, and C2
vertebral body with prominent pannus formation suggesting
inflammatory arthropathy.

## 2021-04-26 IMAGING — CR CHEST - 2 VIEW
2 series · 2 of 2 positions shown · non-contrast
Comparison: 06/16/2017 chest radiographs and earlier.

CLINICAL DATA: [AGE] female with syncopal episode, thready
pulse at that time.

EXAM:
CHEST - 2 VIEW

[x chest ap]
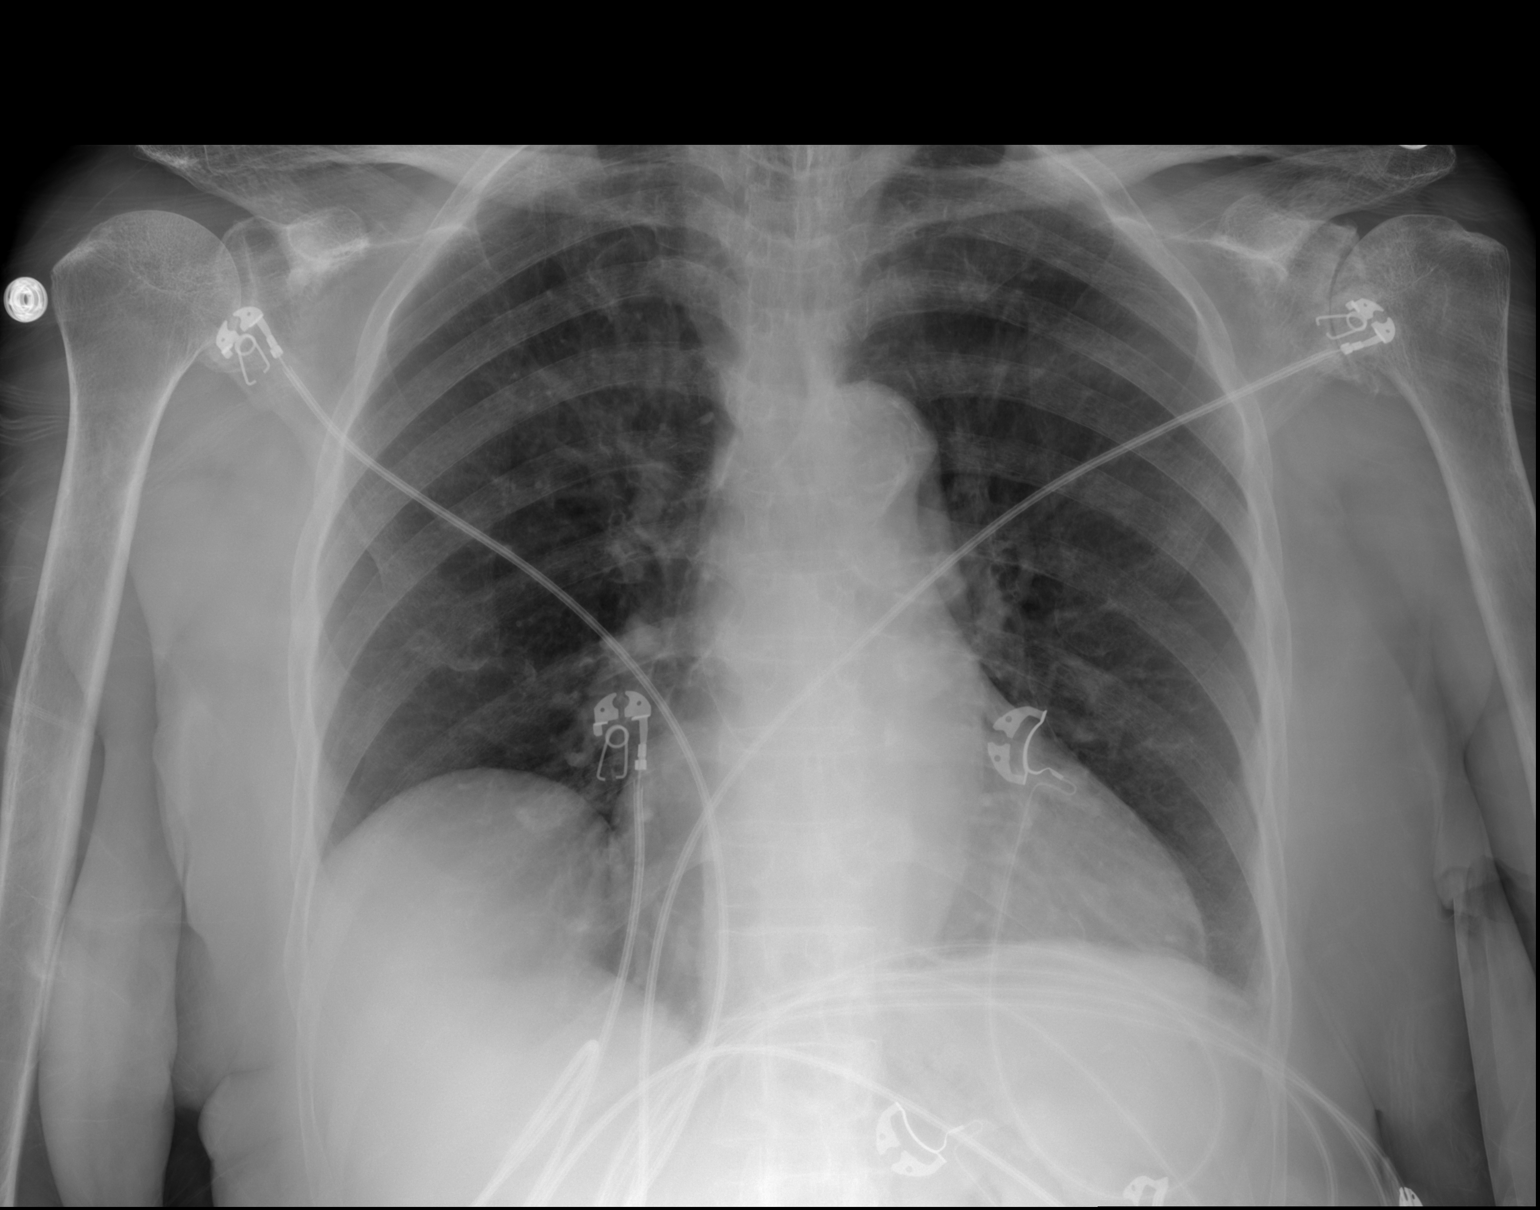

[w chest lat]
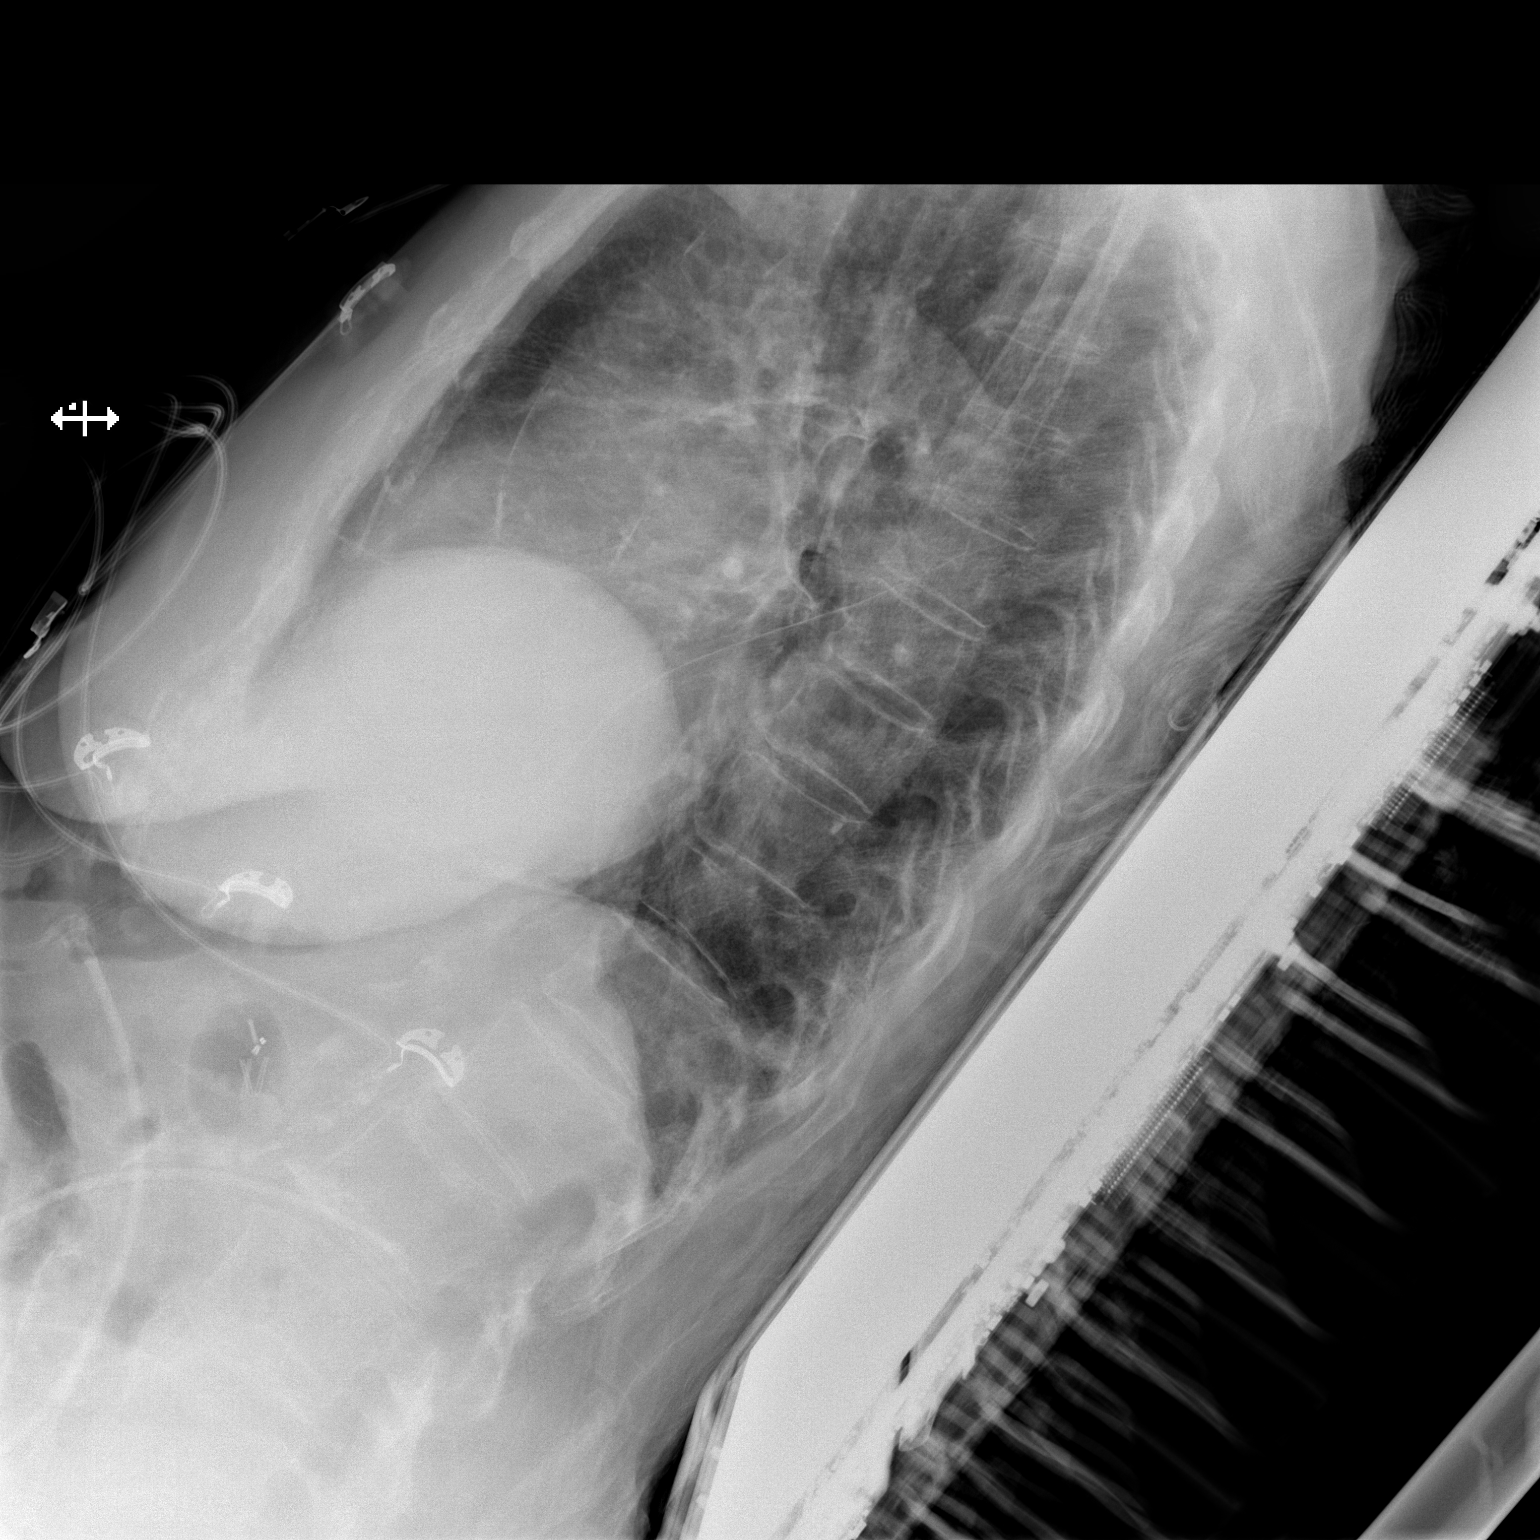

[2 of 2 positions shown; findings below may reference images not displayed]

FINDINGS: Semi upright AP and lateral views. Chronic elevation or eventration
of the right hemidiaphragm. Improved lung volumes compared to 3709.
Stable cardiac size and mediastinal contours. Borderline to mild
cardiomegaly. Calcified aortic atherosclerosis. Visualized tracheal
air column is within normal limits. No pneumothorax, pulmonary
edema, pleural effusion or consolidation.

No acute osseous abnormality identified. Negative visible bowel gas
pattern. Stable cholecystectomy clips.
IMPRESSION: Stable radiographic appearance of the chest since 3709. No acute
cardiopulmonary abnormality.

## 2021-04-26 IMAGING — CR LEFT SHOULDER - 2+ VIEW
3 series · 3 of 3 positions shown · non-contrast
Comparison: Chest radiographs 09/14/2016.

CLINICAL DATA: [AGE] female with syncopal episode, thready
pulse at that time. Left shoulder pain.

EXAM:
LEFT SHOULDER - 2+ VIEW

[x shoulder ap right (1 of 3)]
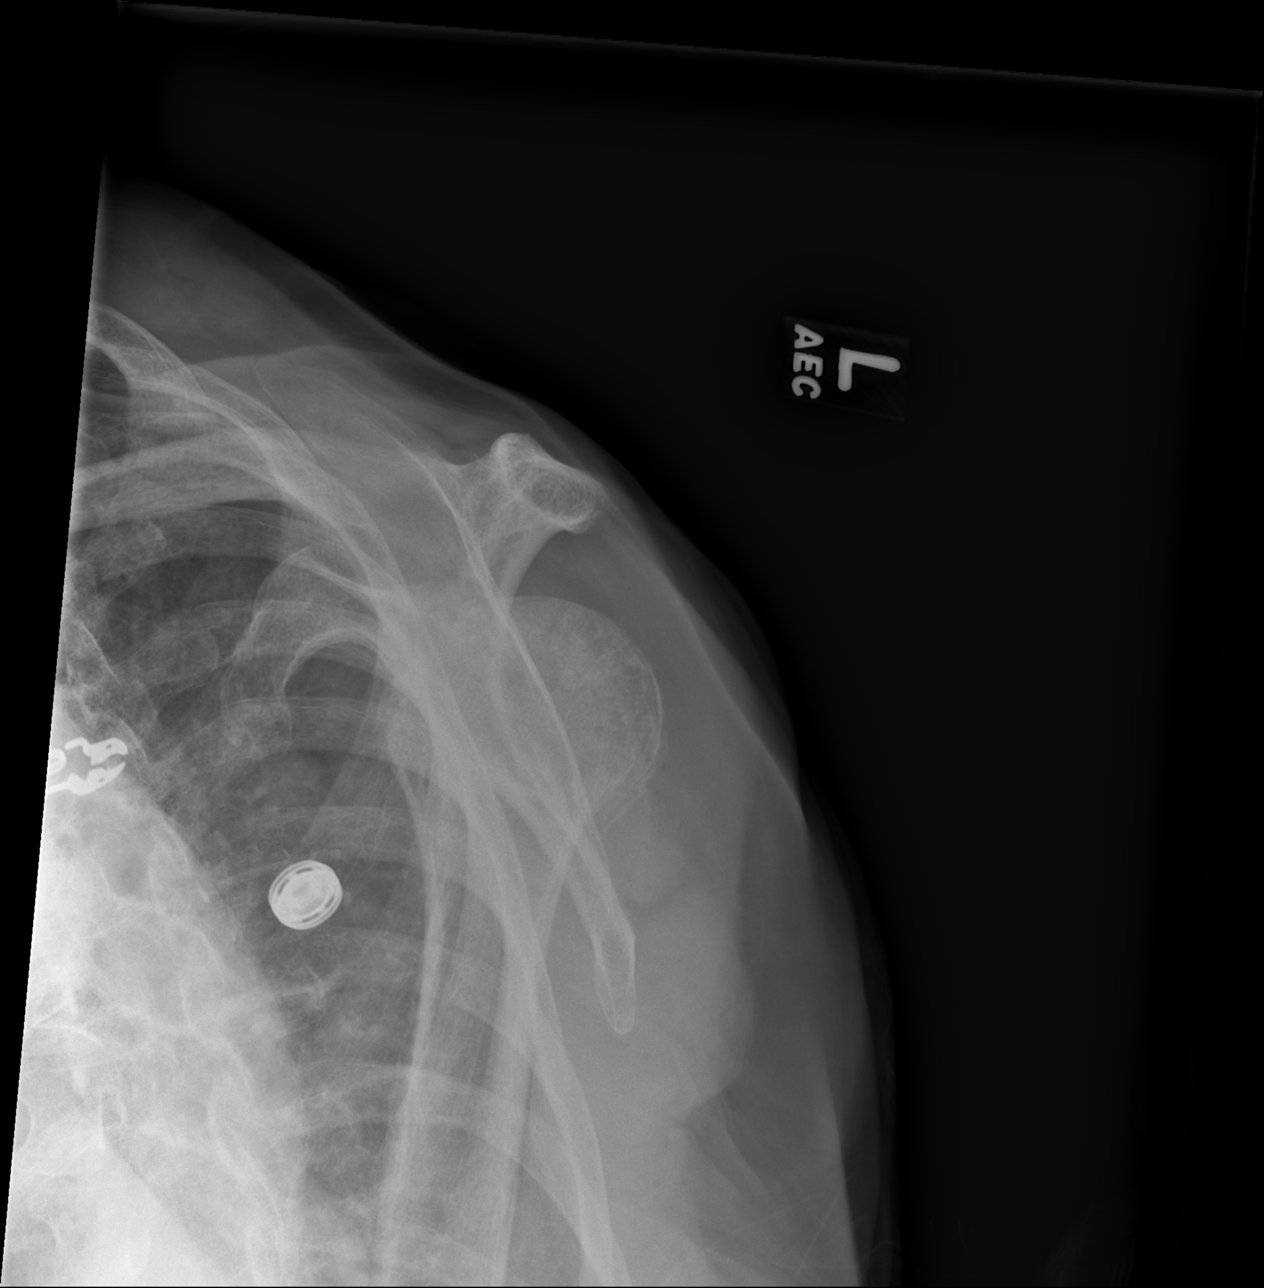

[x shoulder ap right (2 of 3)]
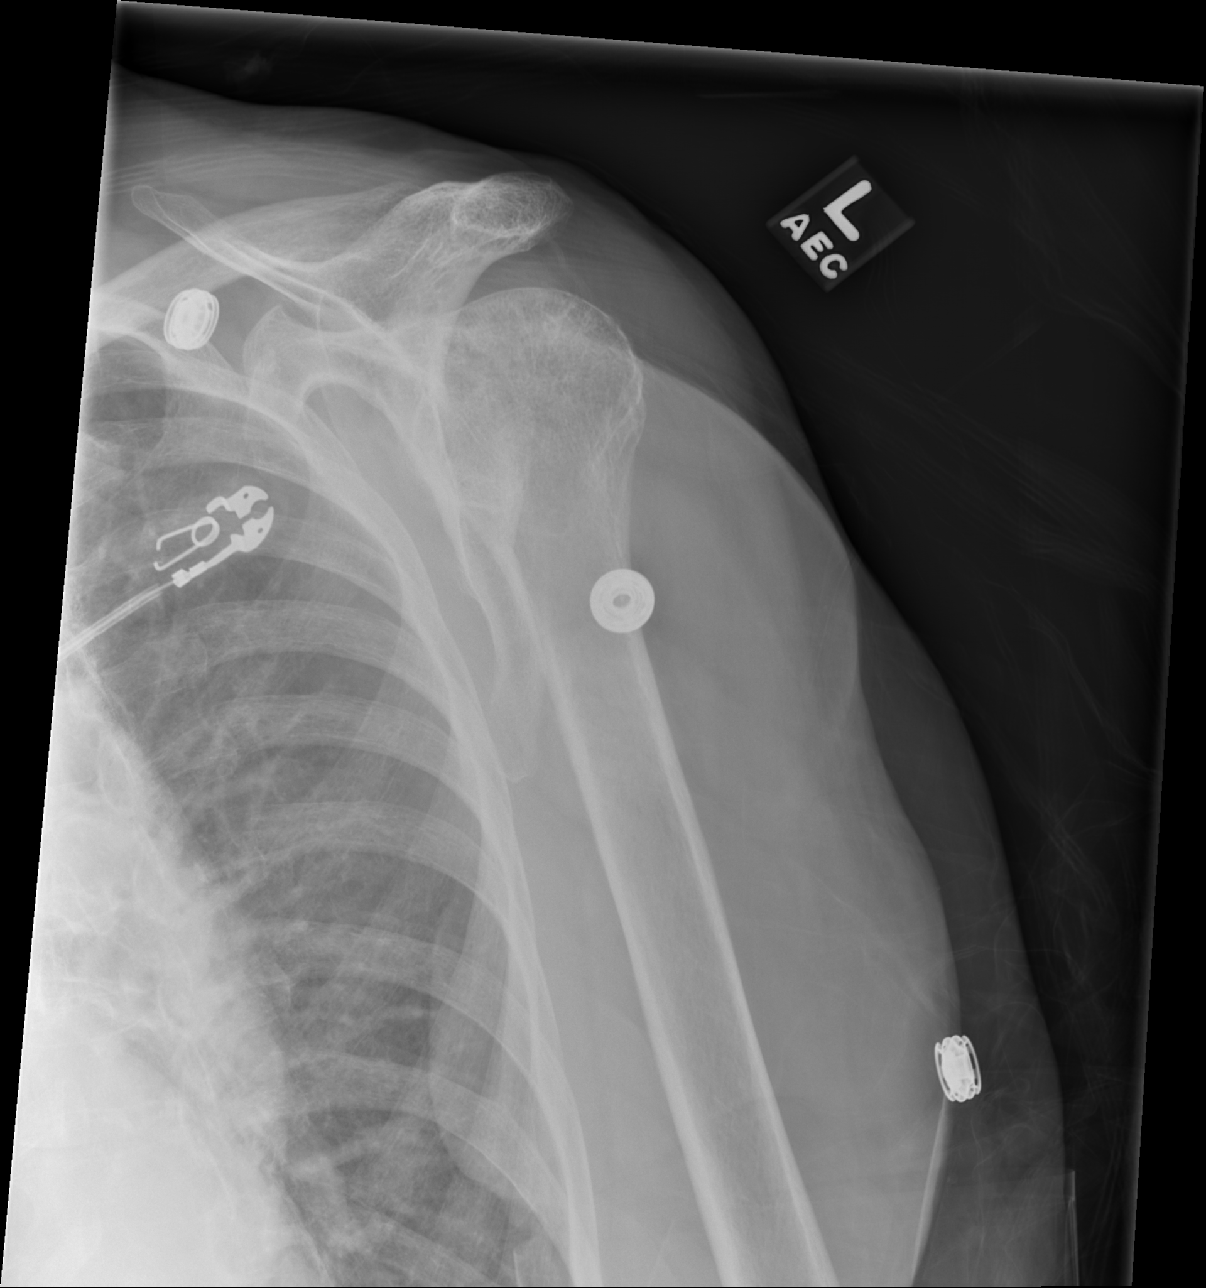

[x shoulder ap right (3 of 3)]
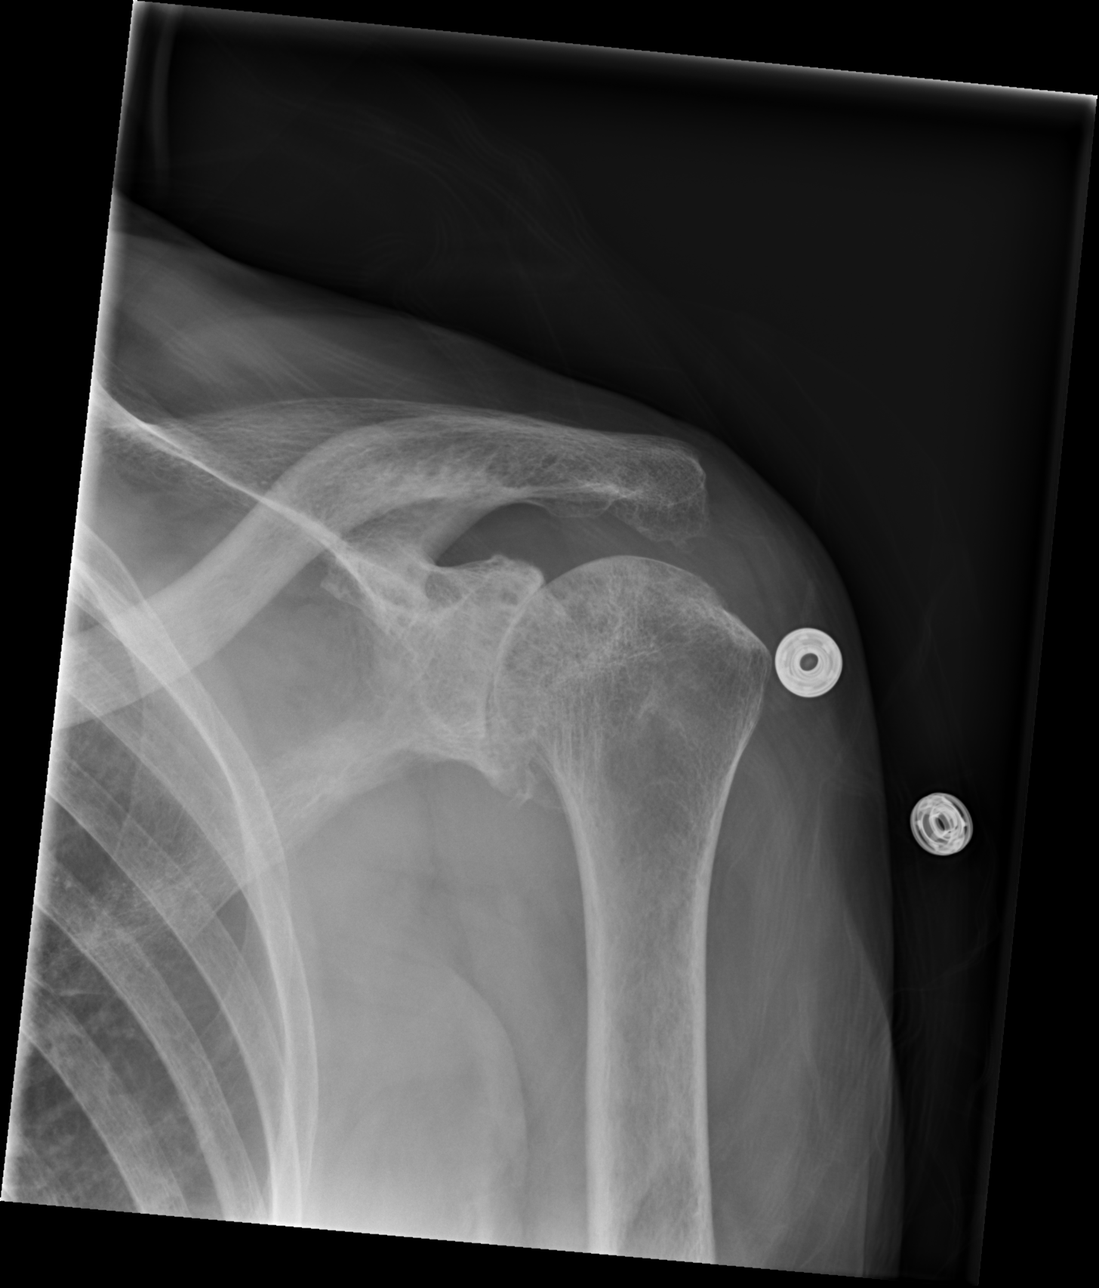

[3 of 3 positions shown; findings below may reference images not displayed]

FINDINGS: Severe glenohumeral joint space loss appears progressed since 4296.
Bone-on-bone appearance now. Chronic bulky osteophytosis. Increased
subchondral sclerosis and subchondral cyst(s).

No glenohumeral joint dislocation. No acute osseous abnormality
identified. Negative visible left chest.
IMPRESSION: No acute osseous abnormality. Severe glenohumeral joint degeneration
has progressed since [DATE].

## 2021-04-28 DIAGNOSIS — M86172 Other acute osteomyelitis, left ankle and foot: Secondary | ICD-10-CM | POA: Diagnosis not present

## 2021-04-28 DIAGNOSIS — E1169 Type 2 diabetes mellitus with other specified complication: Secondary | ICD-10-CM | POA: Diagnosis not present

## 2021-04-28 DIAGNOSIS — E1151 Type 2 diabetes mellitus with diabetic peripheral angiopathy without gangrene: Secondary | ICD-10-CM | POA: Diagnosis not present

## 2021-04-28 DIAGNOSIS — J9610 Chronic respiratory failure, unspecified whether with hypoxia or hypercapnia: Secondary | ICD-10-CM | POA: Diagnosis not present

## 2021-04-28 DIAGNOSIS — E1142 Type 2 diabetes mellitus with diabetic polyneuropathy: Secondary | ICD-10-CM | POA: Diagnosis not present

## 2021-04-28 DIAGNOSIS — E039 Hypothyroidism, unspecified: Secondary | ICD-10-CM | POA: Diagnosis not present

## 2021-04-29 DIAGNOSIS — H401132 Primary open-angle glaucoma, bilateral, moderate stage: Secondary | ICD-10-CM | POA: Diagnosis not present

## 2021-04-29 DIAGNOSIS — H0232 Blepharochalasis right lower eyelid: Secondary | ICD-10-CM | POA: Diagnosis not present

## 2021-04-29 DIAGNOSIS — Z961 Presence of intraocular lens: Secondary | ICD-10-CM | POA: Diagnosis not present

## 2021-04-29 DIAGNOSIS — H16223 Keratoconjunctivitis sicca, not specified as Sjogren's, bilateral: Secondary | ICD-10-CM | POA: Diagnosis not present

## 2021-04-30 NOTE — Progress Notes (Deleted)
Cardiology Office Note:    Date:  04/30/2021   ID:  Shannon [name], DOB 1923/12/30, MRN [id number]  PCP:  Sharon Seller, NP   [facility] Providers Cardiologist:  Tobias Alexander, MD {   Referring MD: Sharon Seller, NP    History of Present Illness:    Shannon [name] is a 88 y.o. female with a hx of chronic diastolic CHF, paroxysmal atrial fibrillation on Coumadin, gastric outlet obstruction s/p gastrojejunostomy feeding tube nonobstructive CAD, history of CVA, COPD, CKD stage III, insulin-dependent type 2 diabetes, hypertension, hyperlipidemia, anemia of chronic disease, lower extremity PAD s/p bilateral interventions followed by VVS,hypothyroidism, and depression who was previously followed by Dr. Delton See who now presents to clinic for follow-up.  Per review of the record, patient admitted with syncope 11/24/2018 possible secondary to bradycardia-coreg stopped. Troponins normal, CT negative, echo normal LVEF. Norvasc added for BP control. Not seen by Cardiology. Readmitted 12/26/18 with BRBPR in the setting of constipation felt to be a hemorrhoidal bleed. Hbg stable 10.1 coumadin continued. Carotid dopplers were stable, 30 day monitor without pauses or arrhthymias, She was hospitalized on 08/2019 with chest pain that was determined to be musculoskeletal.   Was last seen in clinic in 06/2020 where she was doing well from a CV standpoint.   Past Medical History:  Diagnosis Date   Acute respiratory failure (HCC)    Anemia    previous blood transfusions   Arthritis    "all over"   Asthma    Atrial flutter (HCC)    Bradycardia    requiring previous d/c of BB and reduction of amiodarone   CAD (coronary artery disease)    nonobstructive per notes   Chronic diastolic CHF (congestive heart failure) (HCC)    CKD (chronic kidney disease), stage III (HCC)    Complication of blood transfusion    "got the wrong blood type at [facility] in ~ 2015; no adverse reaction that we are aware  of"/daughter, [name] (01/27/2016)   COPD (chronic obstructive pulmonary disease) (HCC)    Depression    "light case"   DVT (deep venous thrombosis) (HCC) 01/2016   a. LLE DVT 01/2016 - switched from Eliquis to Coumadin.   Dyspnea    with some activity   Gastric stenosis    a. s/p stomach tube   GERD (gastroesophageal reflux disease)    GI bleed    Headache    History of 2019 novel coronavirus disease (COVID-19)    History of blood transfusion    "several" (01/27/2016)   History of stomach ulcers    Hyperlipidemia    Hypertension    Hypothyroidism    On home oxygen therapy    "2L; 9PM til 9AM" (06/27/2017)   PAD (peripheral artery disease) (HCC)    a. prior gangrene, toe amputations, intervention.   PAF (paroxysmal atrial fibrillation) (HCC)    Paraesophageal hernia    Perforated gastric ulcer (HCC)    Pneumonia    "a few times" (06/27/2017)   Seasonal allergies    SIADH (syndrome of inappropriate ADH production) (HCC)    [name] 01/10/2015   Small bowel obstruction (HCC)    "I don't know how many" (01/11/2015)   Stroke (HCC)    several- no residual   Type II diabetes mellitus (HCC)    "related to prednisone use  for > 20 yr; once predinose stopped; no more DM RX" (01/27/2016)   UTI (urinary tract infection) 02/08/2016   Ventral hernia with bowel obstruction  Past Surgical History:  Procedure Laterality Date   ABDOMINAL AORTOGRAM N/A 09/21/2016   Procedure: Abdominal Aortogram;  Surgeon: Maeola Harman, MD;  Location: [facility] INVASIVE CV LAB;  Service: Cardiovascular;  Laterality: N/A;   ABDOMINAL AORTOGRAM W/LOWER EXTREMITY N/A 10/20/2020   Procedure: ABDOMINAL AORTOGRAM W/LOWER EXTREMITY;  Surgeon: Leonie Douglas, MD;  Location: [facility] INVASIVE CV LAB;  Service: Cardiovascular;  Laterality: N/A;   AMPUTATION Left 09/25/2016   Procedure: AMPUTATION DIGIT- LEFT 2ND AND 3RD TOES;  Surgeon: Larina Earthly, MD;  Location: [facility] OR;  Service: Vascular;  Laterality: Left;    AMPUTATION Right 05/03/2018   Procedure: AMPUTATION RIGHT GREAT TOE;  Surgeon: Larina Earthly, MD;  Location: [facility] OR;  Service: Vascular;  Laterality: Right;   CATARACT EXTRACTION W/ INTRAOCULAR LENS  IMPLANT, BILATERAL     CHOLECYSTECTOMY OPEN     COLECTOMY     ESOPHAGOGASTRODUODENOSCOPY N/A 01/19/2014   Procedure: ESOPHAGOGASTRODUODENOSCOPY (EGD);  Surgeon: Hilarie Fredrickson, MD;  Location: [facility] ENDOSCOPY;  Service: Endoscopy;  Laterality: N/A;   ESOPHAGOGASTRODUODENOSCOPY N/A 01/20/2014   Procedure: ESOPHAGOGASTRODUODENOSCOPY (EGD);  Surgeon: Hilarie Fredrickson, MD;  Location: [facility]OSCOPY;  Service: Endoscopy;  Laterality: N/A;   ESOPHAGOGASTRODUODENOSCOPY N/A 03/19/2014   Procedure: ESOPHAGOGASTRODUODENOSCOPY (EGD);  Surgeon: Rachael Fee, MD;  Location: [facility]COPY;  Service: Endoscopy;  Laterality: N/A;   ESOPHAGOGASTRODUODENOSCOPY N/A 07/08/2015   Procedure: ESOPHAGOGASTRODUODENOSCOPY (EGD);  Surgeon: Sherrilyn Rist, MD;  Location: [facility] ENDOSCOPY;  Service: Endoscopy;  Laterality: N/A;   ESOPHAGOGASTRODUODENOSCOPY (EGD) WITH PROPOFOL N/A 09/15/2015   Procedure: ESOPHAGOGASTRODUODENOSCOPY (EGD) WITH PROPOFOL;  Surgeon: Ruffin Frederick, MD;  Location: [facility] ENDOSCOPY;  Service: Gastroenterology;  Laterality: N/A;   ESOPHAGOGASTRODUODENOSCOPY (EGD) WITH PROPOFOL N/A 10/12/2020   Procedure: ESOPHAGOGASTRODUODENOSCOPY (EGD) WITH PROPOFOL;  Surgeon: Iva Boop, MD;  Location: [facility] ENDOSCOPY;  Service: Endoscopy;  Laterality: N/A;   FOOT SURGERY     GASTROJEJUNOSTOMY     hx/notes 01/10/2015   GASTROJEJUNOSTOMY N/A 09/23/2015   Procedure: OPEN GASTROJEJUNOSTOMY TUBE PLACEMENT;  Surgeon: De Blanch Kinsinger, MD;  Location: [facility] ORS;  Service: General;  Laterality: N/A;   GLAUCOMA SURGERY Bilateral    HEMATOMA EVACUATION Right 10/20/2020   Procedure: EVACUATION HEMATOMA RIGHT GROIN;  Surgeon: Leonie Douglas, MD;  Location: [facility] OR;  Service: Vascular;  Laterality: Right;   IR CM INJ ANY COLONIC  TUBE W/FLUORO  01/05/2017   IR CM INJ ANY COLONIC TUBE W/FLUORO  06/07/2017   IR CM INJ ANY COLONIC TUBE W/FLUORO  11/05/2017   IR CM INJ ANY COLONIC TUBE W/FLUORO  09/18/2018   IR CM INJ ANY COLONIC TUBE W/FLUORO  03/06/2019   IR CM INJ ANY COLONIC TUBE W/FLUORO  03/11/2019   IR GASTR TUBE CONVERT GASTR-JEJ PER W/FL MOD SED  04/30/2020   IR GENERIC HISTORICAL  01/07/2016   IR GJ TUBE CHANGE 01/07/2016 Malachy Moan, MD [facility]   IR GENERIC HISTORICAL  01/27/2016   IR MECH REMOV OBSTRUC MAT ANY COLON TUBE W/FLUORO 01/27/2016 Richarda Overlie, MD [facility]   IR GENERIC HISTORICAL  02/07/2016   IR PATIENT EVAL TECH 0-60 MINS Darrell K Allred, PA-C [facility]   IR GENERIC HISTORICAL  02/08/2016   IR GJ TUBE CHANGE 02/08/2016 Berdine Dance, MD [facility]   IR GENERIC HISTORICAL  01/06/2016   IR GJ TUBE CHANGE 01/06/2016 [facility] OUT REF   IR GENERIC HISTORICAL  05/02/2016   IR CM INJ ANY COLONIC TUBE W/FLUORO 05/02/2016 Oley Balm, MD [facility]  IR GENERIC HISTORICAL  05/15/2016   IR GJ TUBE CHANGE 05/15/2016 Simonne Come, MD [facility]   IR GENERIC HISTORICAL  06/28/2016   IR GJ TUBE CHANGE 06/28/2016 [facility]   IR GJ TUBE CHANGE  02/20/2017   IR GJ TUBE CHANGE  05/10/2017   IR GJ TUBE CHANGE  07/06/2017   IR GJ TUBE CHANGE  08/02/2017   IR GJ TUBE CHANGE  10/15/2017   IR GJ TUBE CHANGE  12/26/2017   IR GJ TUBE CHANGE  04/08/2018   IR GJ TUBE CHANGE  06/19/2018   IR GJ TUBE CHANGE  03/03/2019   IR GJ TUBE CHANGE  06/06/2019   IR GJ TUBE CHANGE  09/26/2019   IR GJ TUBE CHANGE  01/09/2020   IR GJ TUBE CHANGE  05/12/2020   IR GJ TUBE CHANGE  10/05/2020   IR GJ TUBE CHANGE  10/14/2020   IR GJ TUBE CHANGE  10/19/2020   IR GJ TUBE CHANGE  02/25/2021   IR GJ TUBE CHANGE  03/23/2021   IR PATIENT EVAL TECH 0-60 MINS  10/19/2016   IR PATIENT EVAL TECH 0-60 MINS  12/25/2016   IR PATIENT EVAL TECH 0-60 MINS  01/29/2017   IR PATIENT EVAL TECH 0-60 MINS  04/04/2017    IR PATIENT EVAL TECH 0-60 MINS  04/30/2017   IR PATIENT EVAL TECH 0-60 MINS  07/31/2017   IR REPLC DUODEN/JEJUNO TUBE PERCUT W/FLUORO  11/14/2016   IRRIGATION AND DEBRIDEMENT FOOT Bilateral 12/26/2020   Procedure: IRRIGATION AND DEBRIDEMENT FOOT AND BONE BIOPSY;  Surgeon: Vivi Barrack, DPM;  Location[facility];  Service: Podiatry;  Laterality: Bilateral;   LAPAROTOMY N/A 01/20/2015   Procedure: EXPLORATORY LAPAROTOMY;  Surgeon: Abigail Miyamoto, MD;  Location: [facility] OR;  Service: General;  Laterality: N/A;   LOWER EXTREMITY ANGIOGRAPHY Left 09/21/2016   Procedure: Lower Extremity Angiography;  Surgeon: Maeola Harman, MD;  Location: [facility] INVASIVE CV LAB;  Service: Cardiovascular;  Laterality: Left;   LOWER EXTREMITY ANGIOGRAPHY Right 06/27/2017   Procedure: Lower Extremity Angiography;  Surgeon: Maeola Harman, MD;  Location: [facility] INVASIVE CV LAB;  Service: Cardiovascular;  Laterality: Right;   LOWER EXTREMITY ANGIOGRAPHY Right 12/24/2020   Procedure: LOWER EXTREMITY ANGIOGRAPHY;  Surgeon: Leonie Douglas, MD;  [facility] INVASIVE CV LAB;  Service: Cardiovascular;  Laterality: Right;   LYSIS OF ADHESION N/A 01/20/2015   Procedure: LYSIS OF ADHESIONS < 1 HOUR;  Surgeon: Abigail Miyamoto, MD;  Location[facility];  Service: General;  Laterality: N/A;   PERIPHERAL VASCULAR BALLOON ANGIOPLASTY Left 09/21/2016   Procedure: Peripheral Vascular Balloon Angioplasty;  Surgeon: Maeola Harman, MD;  Location: [facility] INVASIVE CV LAB;  Service: Cardiovascular;  Laterality: Left;  drug coated balloon   PERIPHERAL VASCULAR BALLOON ANGIOPLASTY Right 06/27/2017   Procedure: PERIPHERAL VASCULAR BALLOON ANGIOPLASTY;  Surgeon: Maeola Harman, MD;  Location: [facility] INVASIVE CV LAB;  Service: Cardiovascular;  Laterality: Right;  SFA/[id number]   PERIPHERAL VASCULAR INTERVENTION Left 10/20/2020   Procedure: PERIPHERAL VASCULAR INTERVENTION;  Surgeon: Leonie Douglas, MD;  Location: [facility]  INVASIVE CV LAB;  Service: Cardiovascular;  Laterality: Left;   TONSILLECTOMY     TRANSMETATARSAL AMPUTATION Right 11/14/2019   Procedure: TRANSMETATARSAL AMPUTATION RIGHT FOOT;  Surgeon: Felecia Shelling, DPM;  Location: [facility] OR;  Service: Podiatry;  Laterality: Right;   TUBAL LIGATION     VENTRAL HERNIA REPAIR  2015   incarcerated ventral hernia ([facility] 09/2013)/notes 01/10/2015   WOUND EXPLORATION Right 10/20/2020   Procedure: PRIMARY  CLOSURE COMMON FEMORAL ARTERY;  Surgeon: Leonie Douglas, MD;  Location: [facility] OR;  Service: Vascular;  Laterality: Right;    Current Medications: No outpatient medications have been marked as taking for the 05/04/21 encounter (Appointment) with Meriam Sprague, MD.     Allergies:   Penicillins   Social History   Socioeconomic History   Marital status: Widowed    Spouse name: Not on file   Number of children: 3   Years of education: Not on file   Highest education level: Not on file  Occupational History   Not on file  Tobacco Use   Smoking status: Never   Smokeless tobacco: Former    Types: Snuff    Quit date: 04/03/1980   Tobacco comments:    "used snuff in my younger days"  Vaping Use   Vaping Use: Never used  Substance and Sexual Activity   Alcohol use: Never    Alcohol/week: 0.0 standard drinks   Drug use: Never   Sexual activity: Not Currently  Other Topics Concern   Not on file  Social History Narrative   Married in 1947, lives in one story house with 2 people and no petsOccupation: ?Has a living Will, [place], and doesn't have DNRWas living with Husband (in frail health) in [place] until 09/2013.  After her [facility] admission they both came to live with daughter in [place]   Widowed   Social Determinants of Health   Financial Resource Strain: Not on file  Food Insecurity: Not on file  Transportation Needs: Not on file  Physical Activity: Not on file  Stress: Not on file  Social Connections: Not on file     Family History: The  patient's ***family history includes Diabetes in her brother; Glaucoma in her son and son; Hypertension in her mother; Stroke in her mother. There is no history of Heart attack, Colon cancer, or Stomach cancer.  ROS:   Please see the history of present illness.    *** All other systems reviewed and are negative.  EKGs/Labs/Other Studies Reviewed:    The following studies were reviewed today: 2D echo 05/19/2019   1. Left ventricular ejection fraction, by estimation, is 65 to 70%. The  left ventricle has normal function. The left ventricle has no regional  wall motion abnormalities. There is mild concentric left ventricular  hypertrophy. Left ventricular diastolic  parameters are consistent with Grade I diastolic dysfunction (impaired  relaxation).   2. Right ventricular systolic function is normal. The right ventricular  size is normal. There is normal pulmonary artery systolic pressure. The  estimated right ventricular systolic pressure is 31.4 mmHg.   3. The mitral valve is degenerative. Trivial mitral valve regurgitation.   4. The aortic valve has an indeterminant number of cusps. Aortic valve  regurgitation is trivial. Mild aortic valve sclerosis is present, with no  evidence of aortic valve stenosis.   5. The inferior vena cava is normal in size with <50% respiratory  variability, suggesting right atrial pressure of 8 mmHg.   Comparison(s): A prior study was performed on 11/25/2018. No significant  change from prior study.    Carotid Dopplers 2/2021Summary:  Right Carotid: Velocities in the right ICA are consistent with a 1-39%  stenosis.   Left Carotid: Velocities in the left ICA are consistent with a 1-39%  stenosis.   Vertebrals: Bilateral vertebral arteries demonstrate antegrade flow.   EKG:  EKG is *** ordered today.  The ekg ordered today demonstrates ***  Recent Labs:  12/29/2020: Magnesium 2.0 03/07/2021: ALT 14; BUN 68; Creat 1.72; Hemoglobin 9.3; Platelets 276;  Potassium 4.4; Sodium 139; TSH 3.06  Recent Lipid Panel    Component Value Date/Time   CHOL 106 03/07/2021 1120   CHOL 126 04/07/2015 1015   TRIG 113 03/07/2021 1120   HDL 51 03/07/2021 1120   HDL 69 04/07/2015 1015   CHOLHDL 2.1 03/07/2021 1120   VLDL 26 12/24/2013 0456   LDLCALC 35 03/07/2021 1120     Risk Assessment/Calculations:   {Does this patient have ATRIAL FIBRILLATION?:[id number]}       Physical Exam:    VS:  There were no vitals taken for this visit.    Wt Readings from Last 3 Encounters:  03/09/21 123 lb 12.8 oz (56.2 kg)  02/14/21 126 lb (57.2 kg)  01/25/21 125 lb 8 oz (56.9 kg)     GEN: *** Well nourished, well developed in no acute distress HEENT: Normal NECK: No JVD; No carotid bruits LYMPHATICS: No lymphadenopathy CARDIAC: ***RRR, no murmurs, rubs, gallops RESPIRATORY:  Clear to auscultation without rales, wheezing or rhonchi  ABDOMEN: Soft, non-tender, non-distended MUSCULOSKELETAL:  No edema; No deformity  SKIN: Warm and dry NEUROLOGIC:  Alert and oriented x 3 PSYCHIATRIC:  Normal affect   ASSESSMENT:    No diagnosis found. PLAN:    In order of problems listed above:  #Paroxysmal Afib: Off beta blocker due to bradycardia. Tolerating warfarin without bleeding. -Continue warfarin -No BB duet bradycardia with history of syncope  #HTN: -Continue amlodipine 5mg  daily -Continue hydralzine 50mg  TID  #Chronic Diastolic HF: LVEF 65-70%, G1DD, normal RV, trivial MR, aortic sclerosis. Euvolemic on exam.  #HLD: -Continue lipitor 20mg  daily  #History of GIB: Occurred in 2020. Thought to be due to hemorrhoids. Stable.     {Are you ordering a CV Procedure (e.g. stress test, cath, DCCV, TEE, etc)?   Press F2        :[id number]}    Medication Adjustments/Labs and Tests Ordered: Current medicines are reviewed at length with the patient today.  Concerns regarding medicines are outlined above.  No orders of the defined types were placed in this  encounter.  No orders of the defined types were placed in this encounter.   There are no Patient Instructions on file for this visit.   Signed, Meriam Sprague, MD  04/30/2021 9:06 PM    [facility]

## 2021-05-01 ENCOUNTER — Other Ambulatory Visit: Payer: Self-pay | Admitting: Nurse Practitioner

## 2021-05-04 ENCOUNTER — Other Ambulatory Visit: Payer: Self-pay

## 2021-05-04 ENCOUNTER — Ambulatory Visit (INDEPENDENT_AMBULATORY_CARE_PROVIDER_SITE_OTHER): Payer: Medicare Other | Admitting: Cardiology

## 2021-05-04 ENCOUNTER — Encounter: Payer: Self-pay | Admitting: Cardiology

## 2021-05-04 VITALS — BP 140/60 | HR 88 | Ht 66.0 in | Wt 121.0 lb

## 2021-05-04 DIAGNOSIS — I4892 Unspecified atrial flutter: Secondary | ICD-10-CM | POA: Diagnosis not present

## 2021-05-04 DIAGNOSIS — Z8719 Personal history of other diseases of the digestive system: Secondary | ICD-10-CM | POA: Diagnosis not present

## 2021-05-04 DIAGNOSIS — E782 Mixed hyperlipidemia: Secondary | ICD-10-CM

## 2021-05-04 DIAGNOSIS — I5032 Chronic diastolic (congestive) heart failure: Secondary | ICD-10-CM | POA: Diagnosis not present

## 2021-05-04 DIAGNOSIS — I10 Essential (primary) hypertension: Secondary | ICD-10-CM

## 2021-05-04 DIAGNOSIS — I4891 Unspecified atrial fibrillation: Secondary | ICD-10-CM

## 2021-05-04 DIAGNOSIS — R0789 Other chest pain: Secondary | ICD-10-CM

## 2021-05-04 NOTE — Patient Instructions (Signed)
Medication Instructions:   Your physician recommends that you continue on your current medications as directed. Please refer to the Current Medication list given to you today.  *If you need a refill on your cardiac medications before your next appointment, please call your pharmacy*   Lab Work:  TODAY--BMET AND PRO-BNP  If you have labs (blood work) drawn today and your tests are completely normal, you will receive your results only by: [place] (if you have MyChart) OR A paper copy in the mail If you have any lab test that is abnormal or we need to change your treatment, we will call you to review the results.   Follow-Up: At [facility], you and your health needs are our priority.  As part of our continuing mission to provide you with exceptional heart care, we have created designated Provider Care Teams.  These Care Teams include your primary Cardiologist (physician) and Advanced Practice Providers (APPs -  Physician Assistants and Nurse Practitioners) who all work together to provide you with the care you need, when you need it.  We recommend signing up for the patient portal called "MyChart".  Sign up information is provided on this After Visit Summary.  MyChart is used to connect with patients for Virtual Visits (Telemedicine).  Patients are able to view lab/test results, encounter notes, upcoming appointments, etc.  Non-urgent messages can be sent to your provider as well.   To learn more about what you can do with MyChart, go to [email or web address].    Your next appointment:   6 month(s)  The format for your next appointment:   In Person  Provider:   DR. Johney Frame

## 2021-05-04 NOTE — Progress Notes (Signed)
Cardiology Office Note:    Date:  05/04/2021   ID:  Shannon [name], DOB Nov 27, 1923, MRN [id number]  PCP:  Sharon Seller, NP   [facility] Providers Cardiologist:  Tobias Alexander, MD {   Referring MD: Sharon Seller, NP    History of Present Illness:    Shannon [name] is a 88 y.o. female with a hx of chronic diastolic CHF, paroxysmal atrial fibrillation on Coumadin, gastric outlet obstruction s/p gastrojejunostomy feeding tube nonobstructive CAD, history of CVA, COPD, CKD stage III, insulin-dependent type 2 diabetes, hypertension, hyperlipidemia, anemia of chronic disease, lower extremity PAD s/p bilateral interventions followed by VVS,hypothyroidism, and depression who was previously followed by Dr. Delton See who now presents to clinic for follow-up.  Per review of the record, patient admitted with syncope 11/24/2018 possible secondary to bradycardia-coreg stopped. Troponins normal, CT negative, echo normal LVEF. Norvasc added for BP control. Not seen by Cardiology. Readmitted 12/26/18 with BRBPR in the setting of constipation felt to be a hemorrhoidal bleed. Hbg stable 10.1 coumadin continued. Carotid dopplers were stable, 30 day monitor without pauses or arrhthymias, She was hospitalized on 08/2019 with chest pain that was determined to be musculoskeletal.   Was last seen in clinic in 06/2020 where she was doing well from a CV standpoint.   Today, she is accompanied by her daughter: The patient states she is doing pretty good. At home her blood pressure is averaging 120s/60s.  Lately, she is feeling a fullness in her chest, usually at night when she is lying down. No associated SOB. Typically she sleeps on a wedge-shaped pillow. She believes the fullness in her chest is related to her sinuses and post-nasal drip. Her daughter reports that she also has issues with constipation, and that her chest fullness seems to coincide with her constipation. They have been treating her constipation  with stool softeners, Miralax, and a medicinal tea.   Currently she is on a feeding tube. She does not have much of an appetite, and has lost some weight. She reports that she is now receiving more food over a longer period, beginning around 9 PM. She is able to swallow her medications.  No recent falls or bleeding issues that she is aware of. She denies any palpitations, lightheadedness, headaches, syncope, orthopnea, PND, lower extremity edema or exertional symptoms.   Past Medical History:  Diagnosis Date   Acute respiratory failure (HCC)    Anemia    previous blood transfusions   Arthritis    "all over"   Asthma    Atrial flutter (HCC)    Bradycardia    requiring previous d/c of BB and reduction of amiodarone   CAD (coronary artery disease)    nonobstructive per notes   Chronic diastolic CHF (congestive heart failure) (HCC)    CKD (chronic kidney disease), stage III (HCC)    Complication of blood transfusion    "got the wrong blood type at [facility] in ~ 2015; no adverse reaction that we are aware of"/daughter, [name] (01/27/2016)   COPD (chronic obstructive pulmonary disease) (HCC)    Depression    "light case"   DVT (deep venous thrombosis) (HCC) 01/2016   a. LLE DVT 01/2016 - switched from Eliquis to Coumadin.   Dyspnea    with some activity   Gastric stenosis    a. s/p stomach tube   GERD (gastroesophageal reflux disease)    GI bleed    Headache    History of 2019 novel coronavirus disease (COVID-19)  History of blood transfusion    "several" (01/27/2016)   History of stomach ulcers    Hyperlipidemia    Hypertension    Hypothyroidism    On home oxygen therapy    "2L; 9PM til 9AM" (06/27/2017)   PAD (peripheral artery disease) (HCC)    a. prior gangrene, toe amputations, intervention.   PAF (paroxysmal atrial fibrillation) (HCC)    Paraesophageal hernia    Perforated gastric ulcer (HCC)    Pneumonia    "a few times" (06/27/2017)   Seasonal allergies     SIADH (syndrome of inappropriate ADH production) (HCC)    [name] 01/10/2015   Small bowel obstruction (HCC)    "I don't know how many" (01/11/2015)   Stroke (HCC)    several- no residual   Type II diabetes mellitus (HCC)    "related to prednisone use  for > 20 yr; once predinose stopped; no more DM RX" (01/27/2016)   UTI (urinary tract infection) 02/08/2016   Ventral hernia with bowel obstruction     Past Surgical History:  Procedure Laterality Date   ABDOMINAL AORTOGRAM N/A 09/21/2016   Procedure: Abdominal Aortogram;  Surgeon: Maeola Harman, MD;  Location: [facility] INVASIVE CV LAB;  Service: Cardiovascular;  Laterality: N/A;   ABDOMINAL AORTOGRAM W/LOWER EXTREMITY N/A 10/20/2020   Procedure: ABDOMINAL AORTOGRAM W/LOWER EXTREMITY;  Surgeon: Leonie Douglas, MD;  Location: [facility] INVASIVE CV LAB;  Service: Cardiovascular;  Laterality: N/A;   AMPUTATION Left 09/25/2016   Procedure: AMPUTATION DIGIT- LEFT 2ND AND 3RD TOES;  Surgeon: Larina Earthly, MD;  Location: [facility] OR;  Service: Vascular;  Laterality: Left;   AMPUTATION Right 05/03/2018   Procedure: AMPUTATION RIGHT GREAT TOE;  Surgeon: Larina Earthly, MD;  Location: [facility] OR;  Service: Vascular;  Laterality: Right;   CATARACT EXTRACTION W/ INTRAOCULAR LENS  IMPLANT, BILATERAL     CHOLECYSTECTOMY OPEN     COLECTOMY     ESOPHAGOGASTRODUODENOSCOPY N/A 01/19/2014   Procedure: ESOPHAGOGASTRODUODENOSCOPY (EGD);  Surgeon: Hilarie Fredrickson, MD;  Location: [facility] ENDOSCOPY;  Service: Endoscopy;  Laterality: N/A;   ESOPHAGOGASTRODUODENOSCOPY N/A 01/20/2014   Procedure: ESOPHAGOGASTRODUODENOSCOPY (EGD);  Surgeon: Hilarie Fredrickson, MD;  Location: [facility] ENDOSCOPY;  Service: Endoscopy;  Laterality: N/A;   ESOPHAGOGASTRODUODENOSCOPY N/A 03/19/2014   Procedure: ESOPHAGOGASTRODUODENOSCOPY (EGD);  Surgeon: Rachael Fee, MD;  Location: [facility] ENDOSCOPY;  Service: Endoscopy;  Laterality: N/A;   ESOPHAGOGASTRODUODENOSCOPY N/A 07/08/2015   Procedure:  ESOPHAGOGASTRODUODENOSCOPY (EGD);  Surgeon: Sherrilyn Rist, MD;  Location: [facility] ENDOSCOPY;  Service: Endoscopy;  Laterality: N/A;   ESOPHAGOGASTRODUODENOSCOPY (EGD) WITH PROPOFOL N/A 09/15/2015   Procedure: ESOPHAGOGASTRODUODENOSCOPY (EGD) WITH PROPOFOL;  Surgeon: Ruffin Frederick, MD;  Location: [facility] ENDOSCOPY;  Service: Gastroenterology;  Laterality: N/A;   ESOPHAGOGASTRODUODENOSCOPY (EGD) WITH PROPOFOL N/A 10/12/2020   Procedure: ESOPHAGOGASTRODUODENOSCOPY (EGD) WITH PROPOFOL;  Surgeon: Iva Boop, MD;  Location: [facility] ENDOSCOPY;  Service: Endoscopy;  Laterality: N/A;   FOOT SURGERY     GASTROJEJUNOSTOMY     hx/notes 01/10/2015   GASTROJEJUNOSTOMY N/A 09/23/2015   Procedure: OPEN GASTROJEJUNOSTOMY TUBE PLACEMENT;  Surgeon: De Blanch Kinsinger, MD;  Location: [facility] ORS;  Service: General;  Laterality: N/A;   GLAUCOMA SURGERY Bilateral    HEMATOMA EVACUATION Right 10/20/2020   Procedure: EVACUATION HEMATOMA RIGHT GROIN;  Surgeon: Leonie Douglas, MD;  Location: [facility] OR;  Service: Vascular;  Laterality: Right;   IR CM INJ ANY COLONIC TUBE W/FLUORO  01/05/2017   IR CM INJ ANY COLONIC TUBE W/FLUORO  06/07/2017   IR  CM INJ ANY COLONIC TUBE W/FLUORO  11/05/2017   IR CM INJ ANY COLONIC TUBE W/FLUORO  09/18/2018   IR CM INJ ANY COLONIC TUBE W/FLUORO  03/06/2019   IR CM INJ ANY COLONIC TUBE W/FLUORO  03/11/2019   IR GASTR TUBE CONVERT GASTR-JEJ PER W/FL MOD SED  04/30/2020   IR GENERIC HISTORICAL  01/07/2016   IR GJ TUBE CHANGE 01/07/2016 Malachy Moan, MD [facility]   IR GENERIC HISTORICAL  01/27/2016   IR MECH REMOV OBSTRUC MAT ANY COLON TUBE W/FLUORO 01/27/2016 Richarda Overlie, MD [facility]   IR GENERIC HISTORICAL  02/07/2016   IR PATIENT EVAL TECH 0-60 MINS Darrell K Allred, PA-C [facility]   IR GENERIC HISTORICAL  02/08/2016   IR GJ TUBE CHANGE 02/08/2016 Berdine Dance, MD [facility]   IR GENERIC HISTORICAL  01/06/2016   IR GJ TUBE CHANGE 01/06/2016 [facility] OUT REF   IR GENERIC  HISTORICAL  05/02/2016   IR CM INJ ANY COLONIC TUBE W/FLUORO 05/02/2016 Oley Balm, MD [facility]   IR GENERIC HISTORICAL  05/15/2016   IR GJ TUBE CHANGE 05/15/2016 Simonne Come, MD [facility]   IR GENERIC HISTORICAL  06/28/2016   IR GJ TUBE CHANGE 06/28/2016 [facility]   IR GJ TUBE CHANGE  02/20/2017   IR GJ TUBE CHANGE  05/10/2017   IR GJ TUBE CHANGE  07/06/2017   IR GJ TUBE CHANGE  08/02/2017   IR GJ TUBE CHANGE  10/15/2017   IR GJ TUBE CHANGE  12/26/2017   IR GJ TUBE CHANGE  04/08/2018   IR GJ TUBE CHANGE  06/19/2018   IR GJ TUBE CHANGE  03/03/2019   IR GJ TUBE CHANGE  06/06/2019   IR GJ TUBE CHANGE  09/26/2019   IR GJ TUBE CHANGE  01/09/2020   IR GJ TUBE CHANGE  05/12/2020   IR GJ TUBE CHANGE  10/05/2020   IR GJ TUBE CHANGE  10/14/2020   IR GJ TUBE CHANGE  10/19/2020   IR GJ TUBE CHANGE  02/25/2021   IR GJ TUBE CHANGE  03/23/2021   IR PATIENT EVAL TECH 0-60 MINS  10/19/2016   IR PATIENT EVAL TECH 0-60 MINS  12/25/2016   IR PATIENT EVAL TECH 0-60 MINS  01/29/2017   IR PATIENT EVAL TECH 0-60 MINS  04/04/2017   IR PATIENT EVAL TECH 0-60 MINS  04/30/2017   IR PATIENT EVAL TECH 0-60 MINS  07/31/2017   IR REPLC DUODEN/JEJUNO TUBE PERCUT W/FLUORO  11/14/2016   IRRIGATION AND DEBRIDEMENT FOOT Bilateral 12/26/2020   Procedure: IRRIGATION AND DEBRIDEMENT FOOT AND BONE BIOPSY;  Surgeon: Vivi Barrack, DPM;  Location: [facility] OR;  Service: Podiatry;  Laterality: Bilateral;   LAPAROTOMY N/A 01/20/2015   Procedure: EXPLORATORY LAPAROTOMY;  Surgeon: Abigail Miyamoto, MD;  Location: [facility] OR;  Service: General;  Laterality: N/A;   LOWER EXTREMITY ANGIOGRAPHY Left 09/21/2016   Procedure: Lower Extremity Angiography;  Surgeon: Maeola Harman, MD;  Location: [facility] INVASIVE CV LAB;  Service: Cardiovascular;  Laterality: Left;   LOWER EXTREMITY ANGIOGRAPHY Right 06/27/2017   Procedure: Lower Extremity Angiography;  Surgeon: Maeola Harman, MD;  Location: [facility] INVASIVE CV  LAB;  Service: Cardiovascular;  Laterality: Right;   LOWER EXTREMITY ANGIOGRAPHY Right 12/24/2020   Procedure: LOWER EXTREMITY ANGIOGRAPHY;  Surgeon: Leonie Douglas, MD;  Location: [facility] INVASIVE CV LAB;  Service: Cardiovascular;  Laterality: Right;   LYSIS OF ADHESION N/A 01/20/2015   Procedure: LYSIS OF ADHESIONS < 1 HOUR;  Surgeon: Abigail Miyamoto, MD;  Location: [facility] OR;  Service: General;  Laterality: N/A;   PERIPHERAL VASCULAR BALLOON ANGIOPLASTY Left 09/21/2016   Procedure: Peripheral Vascular Balloon Angioplasty;  Surgeon: Maeola Harman, MD;  Location: [facility] INVASIVE CV LAB;  Service: Cardiovascular;  Laterality: Left;  drug coated balloon   PERIPHERAL VASCULAR BALLOON ANGIOPLASTY Right 06/27/2017   Procedure: PERIPHERAL VASCULAR BALLOON ANGIOPLASTY;  Surgeon: Maeola Harman, MD;  Location: [facility] INVASIVE CV LAB;  Service: Cardiovascular;  Laterality: Right;  SFA/[id number]   PERIPHERAL VASCULAR INTERVENTION Left 10/20/2020   Procedure: PERIPHERAL VASCULAR INTERVENTION;  Surgeon: Leonie Douglas, MD;  Location: [facility] INVASIVE CV LAB;  Service: Cardiovascular;  Laterality: Left;   TONSILLECTOMY     TRANSMETATARSAL AMPUTATION Right 11/14/2019   Procedure: TRANSMETATARSAL AMPUTATION RIGHT FOOT;  Surgeon: Felecia Shelling, DPM;  Location: [facility] OR;  Service: Podiatry;  Laterality: Right;   TUBAL LIGATION     VENTRAL HERNIA REPAIR  2015   incarcerated ventral hernia ([facility] 09/2013)/notes 01/10/2015   WOUND EXPLORATION Right 10/20/2020   Procedure: PRIMARY CLOSURE COMMON FEMORAL ARTERY;  Surgeon: Leonie Douglas, MD;  Location: [facility] OR;  Service: Vascular;  Laterality: Right;    Current Medications: Current Meds  Medication Sig   acetaminophen (TYLENOL) 500 MG tablet Take 500 mg by mouth every 6 (six) hours as needed for moderate pain.   amLODipine (NORVASC) 5 MG tablet TAKE 1 TABLET BY MOUTH EVERY DAY   aspirin 81 MG EC tablet Take 1 tablet (81 mg total) by mouth daily. Swallow whole.    atorvastatin (LIPITOR) 20 MG tablet Take 0.5 tablets (10 mg total) by mouth daily.   b complex vitamins tablet Take 1 tablet by mouth daily.    cholecalciferol (VITAMIN D3) 25 MCG (1000 UNIT) tablet Take 1,000 Units by mouth daily.   cycloSPORINE (RESTASIS) 0.05 % ophthalmic emulsion USE 1 DROP INTO BOTH EYES TWICE DAILY   doxycycline (VIBRA-TABS) 100 MG tablet Take 1 tablet (100 mg total) by mouth daily.   DULoxetine (CYMBALTA) 30 MG capsule TAKE 1 CAPSULE BY MOUTH EVERY DAY   ferrous sulfate 220 (44 Fe) MG/5ML solution TAKE 7.4 MLS (325 MG TOTAL) BY MOUTH DAILY. (Patient taking differently: Place 220 mg into feeding tube daily.)   fluticasone (FLONASE) 50 MCG/ACT nasal spray 2 sprays into each nostril daily.   furosemide (LASIX) 40 MG tablet TAKE 0.5-1 TABLETS (20-40 MG TOTAL) BY MOUTH SEE ADMIN INSTRUCTIONS. TAKE 20 MG DAILY, MAY INSTEAD TAKE 40 MG DAILY AS NEEDED FOR EDEMA   gabapentin (NEURONTIN) 300 MG capsule TAKE 1 CAPSULE BY MOUTH TWICE A DAY   gentamicin cream (GARAMYCIN) 0.1 % Apply 1 application topically daily as needed (dry skin).   hydrALAZINE (APRESOLINE) 50 MG tablet Take 1 tablet (50 mg total) by mouth 3 (three) times daily.   Insulin Pen Needle (B-D UF III MINI PEN NEEDLES) 31G X 5 MM MISC Use twice daily with giving insulin injections. Dx E11.9   latanoprost (XALATAN) 0.005 % ophthalmic solution Place 1 drop into both eyes at bedtime.   levothyroxine (SYNTHROID) 125 MCG tablet TAKE 1 TABLET BY MOUTH EVERY DAY   liver oil-zinc oxide (DESITIN) 40 % ointment Apply topically 2 (two) times daily.   loratadine (CLARITIN) 10 MG tablet Take 10 mg by mouth daily.   mometasone-formoterol (DULERA) 200-5 MCG/ACT AERO INHALE TWO PUFFS BY MOUTH TWICE DAILY   Multiple Vitamin (MULTIVITAMIN WITH MINERALS) TABS tablet Take 1 tablet by mouth daily.   Nutritional Supplements (FEEDING SUPPLEMENT, OSMOLITE 1.5 CAL,) LIQD Place 711  mLs into feeding tube See admin instructions. Take 711 ml over a 12  hour period per day 9pm-9am at a rate of 65   nystatin cream (MYCOSTATIN) APPLY TO AFFECTED AREA TWICE A DAY   oxyCODONE (ROXICODONE) 5 MG immediate release tablet Take 0.5-1 tablets (2.5-5 mg total) by mouth every 8 (eight) hours as needed for severe pain.   pantoprazole (PROTONIX) 40 MG tablet Take 1 tablet (40 mg total) by mouth 2 (two) times daily.   Polyethyl Glycol-Propyl Glycol (SYSTANE) 0.4-0.3 % GEL ophthalmic gel Place 1 application into both eyes 2 (two) times daily as needed (moisture).   Probiotic Product (EQL PROBIOTIC COLON SUPPORT PO) Take 1 capsule by mouth daily.   senna-docusate (SENOKOT-S) 8.6-50 MG tablet Take 1 tablet by mouth at bedtime.   Sodium Chloride-Sodium Bicarb (NETI POT SINUS WASH NA) Place 1 application into the nose at bedtime.   sucralfate (CARAFATE) 1 GM/10ML suspension Take 10 mLs (1 g total) by mouth every 6 (six) hours.   traMADol (ULTRAM) 50 MG tablet Place 1 tablet (50 mg total) into feeding tube every 6 (six) hours as needed for severe pain.   Water For Irrigation, Sterile (FREE WATER) SOLN Place 180 mLs into feeding tube 3 (three) times daily.   White Petrolatum-Mineral Oil (SYSTANE NIGHTTIME) OINT Place 1 application into both eyes at bedtime.      Allergies:   Penicillins   Social History   Socioeconomic History   Marital status: Widowed    Spouse name: Not on file   Number of children: 3   Years of education: Not on file   Highest education level: Not on file  Occupational History   Not on file  Tobacco Use   Smoking status: Never   Smokeless tobacco: Former    Types: Snuff    Quit date: 04/03/1980   Tobacco comments:    "used snuff in my younger days"  Vaping Use   Vaping Use: Never used  Substance and Sexual Activity   Alcohol use: Never    Alcohol/week: 0.0 standard drinks   Drug use: Never   Sexual activity: Not Currently  Other Topics Concern   Not on file  Social History Narrative   Married in 1947, lives in one story house  with 2 people and no petsOccupation: ?Has a living Will, [place], and doesn't have DNRWas living with Husband (in frail health) in [place] until 09/2013.  After her [facility] admission they both came to live with daughter in [place]   Widowed   Social Determinants of Health   Financial Resource Strain: Not on file  Food Insecurity: Not on file  Transportation Needs: Not on file  Physical Activity: Not on file  Stress: Not on file  Social Connections: Not on file     Family History: The patient's family history includes Diabetes in her brother; Glaucoma in her son and son; Hypertension in her mother; Stroke in her mother. There is no history of Heart attack, Colon cancer, or Stomach cancer.  ROS:   Review of Systems  Constitutional:  Negative for chills and fever.  HENT:  Positive for congestion. Negative for hearing loss and tinnitus.   Eyes:  Negative for photophobia and discharge.  Respiratory:  Positive for shortness of breath. Negative for cough and hemoptysis.   Cardiovascular:  Positive for chest pain. Negative for palpitations, orthopnea, claudication, leg swelling and PND.  Gastrointestinal:  Positive for constipation. Negative for melena and vomiting.  Genitourinary:  Negative for hematuria.  Musculoskeletal:  Negative for back pain and neck pain.  Neurological:  Negative for dizziness and seizures.  Endo/Heme/Allergies:  Negative for polydipsia.  Psychiatric/Behavioral:  Negative for depression and substance abuse.     EKGs/Labs/Other Studies Reviewed:    The following studies were reviewed today:  Right LE Venous DVT 02/14/2021: Summary:  RIGHT:  - There is no evidence of deep vein thrombosis in the lower extremity.     - No cystic structure found in the popliteal fossa.     LEFT:  - No evidence of common femoral vein obstruction.   ABI 01/25/2021: Right ABIs appear decreased compared to prior study on 11/16/2020. Left  ABIs appear essentially unchanged  compared to prior study on 11/16/2020.     Summary:  Right: Resting right ankle-brachial index indicates mild right lower  extremity arterial disease.   Left: Resting left ankle-brachial index indicates noncompressible left  lower extremity arteries. The left toe-brachial index is abnormal.  2D echo 05/19/2019   1. Left ventricular ejection fraction, by estimation, is 65 to 70%. The  left ventricle has normal function. The left ventricle has no regional  wall motion abnormalities. There is mild concentric left ventricular  hypertrophy. Left ventricular diastolic  parameters are consistent with Grade I diastolic dysfunction (impaired  relaxation).   2. Right ventricular systolic function is normal. The right ventricular  size is normal. There is normal pulmonary artery systolic pressure. The  estimated right ventricular systolic pressure is 31.4 mmHg.   3. The mitral valve is degenerative. Trivial mitral valve regurgitation.   4. The aortic valve has an indeterminant number of cusps. Aortic valve  regurgitation is trivial. Mild aortic valve sclerosis is present, with no  evidence of aortic valve stenosis.   5. The inferior vena cava is normal in size with <50% respiratory  variability, suggesting right atrial pressure of 8 mmHg.   Comparison(s): A prior study was performed on 11/25/2018. No significant  change from prior study.    Carotid Dopplers 05/2019 Summary:  Right Carotid: Velocities in the right ICA are consistent with a 1-39%  stenosis.   Left Carotid: Velocities in the left ICA are consistent with a 1-39%  stenosis.   Vertebrals: Bilateral vertebral arteries demonstrate antegrade flow.   EKG:  EKG is personally reviewed.  05/04/2021: EKG was not ordered.   Recent Labs: 12/29/2020: Magnesium 2.0 03/07/2021: ALT 14; BUN 68; Creat 1.72; Hemoglobin 9.3; Platelets 276; Potassium 4.4; Sodium 139; TSH 3.06   Recent Lipid Panel    Component Value Date/Time   CHOL 106  03/07/2021 1120   CHOL 126 04/07/2015 1015   TRIG 113 03/07/2021 1120   HDL 51 03/07/2021 1120   HDL 69 04/07/2015 1015   CHOLHDL 2.1 03/07/2021 1120   VLDL 26 12/24/2013 0456   LDLCALC 35 03/07/2021 1120     Risk Assessment/Calculations:           Physical Exam:    VS:  BP 140/60 (BP Location: Left Arm, Patient Position: Sitting, Cuff Size: Normal)    Pulse 88    Ht 5\' 6"  (1.676 m)    Wt 121 lb (54.9 kg)    SpO2 95%    BMI 19.53 kg/m     Wt Readings from Last 3 Encounters:  05/04/21 121 lb (54.9 kg)  03/09/21 123 lb 12.8 oz (56.2 kg)  02/14/21 126 lb (57.2 kg)     GEN: Well nourished, well developed in no acute distress HEENT: Normal NECK: No JVD; No carotid  bruits LYMPHATICS: No lymphadenopathy CARDIAC: RRR, 2/6 systolic murmur, no rubs, no gallops RESPIRATORY:  Clear to auscultation without rales, wheezing or rhonchi  ABDOMEN: Soft, non-tender, non-distended. Tenderness at feeding tube site. MUSCULOSKELETAL:  No edema; No deformity  SKIN: Warm and dry NEUROLOGIC:  Alert and oriented x 3 PSYCHIATRIC:  Normal affect   ASSESSMENT:    1. Chronic diastolic CHF (congestive heart failure) (HCC)   2. Atrial fibrillation and flutter (HCC)   3. Essential hypertension   4. Mixed hyperlipidemia   5. History of GI bleed   6. Chest fullness    PLAN:    In order of problems listed above:  #Chest Fullness: Suspect secondary to constipation and trying to increase her G-tube feeds. Symptoms occur at night when she is laying down and receiving her tube feeds. Not associated with exertion and patient is euvolemic today. Recommend continuing managing constipation. Will check BNP today to ensure not related to gut edema. -Check BNP -Continue management of consitpation  #Paroxysmal Afib: Off beta blocker due to bradycardia. Off warfarin due to recurrent GIB -Off warfarin due to recurrent GIB -No BB duet bradycardia with history of syncope  #HTN: -Continue amlodipine 5mg   daily -Continue hydralzine 50mg  TID  #Chronic Diastolic HF: LVEF 65-70%, G1DD, normal RV, trivial MR, aortic sclerosis. Euvolemic on exam. -Continue lasix 20-40mg  daily -Check BMET and BNP today -Compression socks  #HLD: -Continue lipitor 20mg  daily  #History of GIB: Now off warfarin and plavix.        Follow-up:  6 months.  Medication Adjustments/Labs and Tests Ordered: Current medicines are reviewed at length with the patient today.  Concerns regarding medicines are outlined above.   Orders Placed This Encounter  Procedures   Basic metabolic panel   Pro b natriuretic peptide   No orders of the defined types were placed in this encounter.  Patient Instructions  Medication Instructions:   Your physician recommends that you continue on your current medications as directed. Please refer to the Current Medication list given to you today.  *If you need a refill on your cardiac medications before your next appointment, please call your pharmacy*   Lab Work:  TODAY--BMET AND PRO-BNP  If you have labs (blood work) drawn today and your tests are completely normal, you will receive your results only by: MyChart Message (if you have MyChart) OR A paper copy in the mail If you have any lab test that is abnormal or we need to change your treatment, we will call you to review the results.   Follow-Up: At [facility], you and your health needs are our priority.  As part of our continuing mission to provide you with exceptional heart care, we have created designated Provider Care Teams.  These Care Teams include your primary Cardiologist (physician) and Advanced Practice Providers (APPs -  Physician Assistants and Nurse Practitioners) who all work together to provide you with the care you need, when you need it.  We recommend signing up for the patient portal called "MyChart".  Sign up information is provided on this After Visit Summary.  MyChart is used to connect with  patients for Virtual Visits (Telemedicine).  Patients are able to view lab/test results, encounter notes, upcoming appointments, etc.  Non-urgent messages can be sent to your provider as well.   To learn more about what you can do with MyChart, go to [email or web address].    Your next appointment:   6 month(s)  The format for your next appointment:   In  Person  Provider:   DR. Chales Abrahams Stumpf,acting as a scribe for Meriam Sprague, MD.,have documented all relevant documentation on the behalf of Meriam Sprague, MD,as directed by  Meriam Sprague, MD while in the presence of Meriam Sprague, MD.  I, Meriam Sprague, MD, have reviewed all documentation for this visit. The documentation on 05/04/21 for the exam, diagnosis, procedures, and orders are all accurate and complete.   Signed, Meriam Sprague, MD  05/04/2021 4:03 PM    [facility]

## 2021-05-05 LAB — BASIC METABOLIC PANEL
BUN/Creatinine Ratio: 47 — ABNORMAL HIGH (ref 12–28)
BUN: 69 mg/dL — ABNORMAL HIGH (ref 10–36)
CO2: 24 mmol/L (ref 20–29)
Calcium: 9.5 mg/dL (ref 8.7–10.3)
Chloride: 103 mmol/L (ref 96–106)
Creatinine, Ser: 1.48 mg/dL — ABNORMAL HIGH (ref 0.57–1.00)
Glucose: 99 mg/dL (ref 70–99)
Potassium: 5.2 mmol/L (ref 3.5–5.2)
Sodium: 141 mmol/L (ref 134–144)
eGFR: 32 mL/min/{1.73_m2} — ABNORMAL LOW (ref >59)

## 2021-05-05 LAB — PRO B NATRIURETIC PEPTIDE: NT-Pro BNP: 769 pg/mL — ABNORMAL HIGH (ref 0–738)

## 2021-05-08 DIAGNOSIS — E1169 Type 2 diabetes mellitus with other specified complication: Secondary | ICD-10-CM | POA: Diagnosis not present

## 2021-05-08 DIAGNOSIS — J9610 Chronic respiratory failure, unspecified whether with hypoxia or hypercapnia: Secondary | ICD-10-CM | POA: Diagnosis not present

## 2021-05-08 DIAGNOSIS — E039 Hypothyroidism, unspecified: Secondary | ICD-10-CM | POA: Diagnosis not present

## 2021-05-08 DIAGNOSIS — M86172 Other acute osteomyelitis, left ankle and foot: Secondary | ICD-10-CM | POA: Diagnosis not present

## 2021-05-08 DIAGNOSIS — E1151 Type 2 diabetes mellitus with diabetic peripheral angiopathy without gangrene: Secondary | ICD-10-CM | POA: Diagnosis not present

## 2021-05-08 DIAGNOSIS — E1142 Type 2 diabetes mellitus with diabetic polyneuropathy: Secondary | ICD-10-CM | POA: Diagnosis not present

## 2021-05-11 ENCOUNTER — Ambulatory Visit (INDEPENDENT_AMBULATORY_CARE_PROVIDER_SITE_OTHER): Payer: Medicare Other | Admitting: Podiatry

## 2021-05-11 ENCOUNTER — Other Ambulatory Visit: Payer: Self-pay

## 2021-05-11 DIAGNOSIS — L97512 Non-pressure chronic ulcer of other part of right foot with fat layer exposed: Secondary | ICD-10-CM | POA: Diagnosis not present

## 2021-05-11 DIAGNOSIS — E0859 Diabetes mellitus due to underlying condition with other circulatory complications: Secondary | ICD-10-CM

## 2021-05-11 DIAGNOSIS — M86671 Other chronic osteomyelitis, right ankle and foot: Secondary | ICD-10-CM

## 2021-05-12 DIAGNOSIS — Z20822 Contact with and (suspected) exposure to covid-19: Secondary | ICD-10-CM | POA: Diagnosis not present

## 2021-05-12 DIAGNOSIS — E039 Hypothyroidism, unspecified: Secondary | ICD-10-CM | POA: Diagnosis not present

## 2021-05-12 DIAGNOSIS — M86172 Other acute osteomyelitis, left ankle and foot: Secondary | ICD-10-CM | POA: Diagnosis not present

## 2021-05-12 DIAGNOSIS — E1169 Type 2 diabetes mellitus with other specified complication: Secondary | ICD-10-CM | POA: Diagnosis not present

## 2021-05-12 DIAGNOSIS — E1142 Type 2 diabetes mellitus with diabetic polyneuropathy: Secondary | ICD-10-CM | POA: Diagnosis not present

## 2021-05-12 DIAGNOSIS — J9610 Chronic respiratory failure, unspecified whether with hypoxia or hypercapnia: Secondary | ICD-10-CM | POA: Diagnosis not present

## 2021-05-12 DIAGNOSIS — E1151 Type 2 diabetes mellitus with diabetic peripheral angiopathy without gangrene: Secondary | ICD-10-CM | POA: Diagnosis not present

## 2021-05-13 DIAGNOSIS — Z20822 Contact with and (suspected) exposure to covid-19: Secondary | ICD-10-CM | POA: Diagnosis not present

## 2021-05-14 DIAGNOSIS — E1169 Type 2 diabetes mellitus with other specified complication: Secondary | ICD-10-CM | POA: Diagnosis not present

## 2021-05-14 DIAGNOSIS — N184 Chronic kidney disease, stage 4 (severe): Secondary | ICD-10-CM | POA: Diagnosis not present

## 2021-05-14 DIAGNOSIS — I4892 Unspecified atrial flutter: Secondary | ICD-10-CM | POA: Diagnosis not present

## 2021-05-14 DIAGNOSIS — M86172 Other acute osteomyelitis, left ankle and foot: Secondary | ICD-10-CM | POA: Diagnosis not present

## 2021-05-14 DIAGNOSIS — E1122 Type 2 diabetes mellitus with diabetic chronic kidney disease: Secondary | ICD-10-CM | POA: Diagnosis not present

## 2021-05-14 DIAGNOSIS — G8929 Other chronic pain: Secondary | ICD-10-CM | POA: Diagnosis not present

## 2021-05-14 DIAGNOSIS — K311 Adult hypertrophic pyloric stenosis: Secondary | ICD-10-CM | POA: Diagnosis not present

## 2021-05-14 DIAGNOSIS — I130 Hypertensive heart and chronic kidney disease with heart failure and stage 1 through stage 4 chronic kidney disease, or unspecified chronic kidney disease: Secondary | ICD-10-CM | POA: Diagnosis not present

## 2021-05-14 DIAGNOSIS — I2510 Atherosclerotic heart disease of native coronary artery without angina pectoris: Secondary | ICD-10-CM | POA: Diagnosis not present

## 2021-05-14 DIAGNOSIS — K439 Ventral hernia without obstruction or gangrene: Secondary | ICD-10-CM | POA: Diagnosis not present

## 2021-05-14 DIAGNOSIS — J449 Chronic obstructive pulmonary disease, unspecified: Secondary | ICD-10-CM | POA: Diagnosis not present

## 2021-05-14 DIAGNOSIS — D631 Anemia in chronic kidney disease: Secondary | ICD-10-CM | POA: Diagnosis not present

## 2021-05-14 DIAGNOSIS — K5900 Constipation, unspecified: Secondary | ICD-10-CM | POA: Diagnosis not present

## 2021-05-14 DIAGNOSIS — E785 Hyperlipidemia, unspecified: Secondary | ICD-10-CM | POA: Diagnosis not present

## 2021-05-14 DIAGNOSIS — Z79891 Long term (current) use of opiate analgesic: Secondary | ICD-10-CM | POA: Diagnosis not present

## 2021-05-14 DIAGNOSIS — J9610 Chronic respiratory failure, unspecified whether with hypoxia or hypercapnia: Secondary | ICD-10-CM | POA: Diagnosis not present

## 2021-05-14 DIAGNOSIS — M150 Primary generalized (osteo)arthritis: Secondary | ICD-10-CM | POA: Diagnosis not present

## 2021-05-14 DIAGNOSIS — I4819 Other persistent atrial fibrillation: Secondary | ICD-10-CM | POA: Diagnosis not present

## 2021-05-14 DIAGNOSIS — K285 Chronic or unspecified gastrojejunal ulcer with perforation: Secondary | ICD-10-CM | POA: Diagnosis not present

## 2021-05-14 DIAGNOSIS — E039 Hypothyroidism, unspecified: Secondary | ICD-10-CM | POA: Diagnosis not present

## 2021-05-14 DIAGNOSIS — I5033 Acute on chronic diastolic (congestive) heart failure: Secondary | ICD-10-CM | POA: Diagnosis not present

## 2021-05-14 DIAGNOSIS — E1151 Type 2 diabetes mellitus with diabetic peripheral angiopathy without gangrene: Secondary | ICD-10-CM | POA: Diagnosis not present

## 2021-05-14 DIAGNOSIS — E1142 Type 2 diabetes mellitus with diabetic polyneuropathy: Secondary | ICD-10-CM | POA: Diagnosis not present

## 2021-05-14 DIAGNOSIS — F325 Major depressive disorder, single episode, in full remission: Secondary | ICD-10-CM | POA: Diagnosis not present

## 2021-05-14 DIAGNOSIS — K219 Gastro-esophageal reflux disease without esophagitis: Secondary | ICD-10-CM | POA: Diagnosis not present

## 2021-05-18 NOTE — Progress Notes (Signed)
Subjective:  Patient presenting today for follow-up evaluation of chronic wounds to bilateral feet.  Patient states that she is doing well.  They have been changing the dressings 2 times per week and they feel that the wounds are stable.  No new complaints at this time  Past Medical History:  Diagnosis Date   Acute respiratory failure ([place])    Anemia    previous blood transfusions   Arthritis    "all over"   Asthma    Atrial flutter (HCC)    Bradycardia    requiring previous d/c of BB and reduction of amiodarone   CAD (coronary artery disease)    nonobstructive per notes   Chronic diastolic CHF (congestive heart failure) ([place])    CKD (chronic kidney disease), stage III ([place])    Complication of blood transfusion    "got the wrong blood type at [facility] in ~ 2015; no adverse reaction that we are aware of"/daughter, [name] (01/27/2016)   COPD (chronic obstructive pulmonary disease) ([place])    Depression    "light case"   DVT (deep venous thrombosis) ([place]) 01/2016   a. LLE DVT 01/2016 - switched from Eliquis to Coumadin.   Dyspnea    with some activity   Gastric stenosis    a. s/p stomach tube   GERD (gastroesophageal reflux disease)    GI bleed    Headache    History of 2019 novel coronavirus disease (COVID-19)    History of blood transfusion    "several" (01/27/2016)   History of stomach ulcers    Hyperlipidemia    Hypertension    Hypothyroidism    On home oxygen therapy    "2L; 9PM til 9AM" (06/27/2017)   PAD (peripheral artery disease) (HCC)    a. prior gangrene, toe amputations, intervention.   PAF (paroxysmal atrial fibrillation) ([place])    Paraesophageal hernia    Perforated gastric ulcer ([place])    Pneumonia    "a few times" (06/27/2017)   Seasonal allergies    SIADH (syndrome of inappropriate ADH production) ([place])    [name] 01/10/2015   Small bowel obstruction ([place])    "I don't know how many" (01/11/2015)   Stroke ([place])    several- no residual   Type II  diabetes mellitus ([place])    "related to prednisone use  for > 20 yr; once predinose stopped; no more DM RX" (01/27/2016)   UTI (urinary tract infection) 02/08/2016   Ventral hernia with bowel obstruction       Objective: Physical Exam General: The patient is alert and oriented x3 in no acute distress.  Dermatology: Ulcers noted to the right plantar forefoot amputation stump.  Please see attached photo.  No malodor.  Moderate serosanguineous drainage noted.  Overall the wounds appear very stable for the moment.  Vascular: Established PAD.  Monitored closely by vein and vascular.  Balloon angioplasty with vein and vascular.  Lower extremity angiography 12/24/2020  VAS [place] ABI W/WO TBI 09/10/2020 Right: Resting right ankle-brachial index is within normal range using the  posterior tibial artery . No evidence of significant right lower extremity  arterial disease.   Left: Resting left ankle-brachial index indicates noncompressible left  lower extremity arteries. Unable to obtain left great toe index due to  bandage.   Neurological: Epicritic and protective threshold diminished bilaterally.   Musculoskeletal Exam: Status post TMA right foot.  Hallux valgus left with history of overlying ulcer and degenerative changes possible exposed bone and joint capsule.  Second  and third toe amputation left.  Assessment: 1. s/p transmetatarsal amputation right foot. DOS: 11/14/2019 2.  H/o 2nd toe amputation left 3.  Chronic ulcers bilateral feet 4.  Chronic osteomyelitis bilateral feet  Plan of Care:  1. Patient was evaluated.   2.  Continue home nurse dressing changes 2 times weekly.  They have been applying Betadine and Aquacel to the wounds with dry dressings 3.  We are going to continue to pursue conservative palliative care for the patient.  Patient does not want any additional surgery to the feet.  Both the patient and daughter understand current situation of the chronic osteomyelitis and  associated risks 5.  Medically necessary excisional debridement of the wounds was performed today using a tissue nipper.  Excisional debridement of all necrotic nonviable tissue down to healthier bleeding viable tissue was performed with postdebridement measurement same as pre- 6.  Return to clinic monthly for repeat evaluation  Edrick Kins, DPM [facility]  Dr. Edrick Kins, DPM    [address], [place] [address]                Office [phone]  Fax [phone]

## 2021-05-19 ENCOUNTER — Other Ambulatory Visit: Payer: Self-pay | Admitting: *Deleted

## 2021-05-19 DIAGNOSIS — E1169 Type 2 diabetes mellitus with other specified complication: Secondary | ICD-10-CM | POA: Diagnosis not present

## 2021-05-19 DIAGNOSIS — M86172 Other acute osteomyelitis, left ankle and foot: Secondary | ICD-10-CM | POA: Diagnosis not present

## 2021-05-19 DIAGNOSIS — J9610 Chronic respiratory failure, unspecified whether with hypoxia or hypercapnia: Secondary | ICD-10-CM | POA: Diagnosis not present

## 2021-05-19 DIAGNOSIS — E039 Hypothyroidism, unspecified: Secondary | ICD-10-CM | POA: Diagnosis not present

## 2021-05-19 DIAGNOSIS — E1151 Type 2 diabetes mellitus with diabetic peripheral angiopathy without gangrene: Secondary | ICD-10-CM | POA: Diagnosis not present

## 2021-05-19 DIAGNOSIS — E1142 Type 2 diabetes mellitus with diabetic polyneuropathy: Secondary | ICD-10-CM | POA: Diagnosis not present

## 2021-05-19 NOTE — Patient Outreach (Signed)
[facility]) Care Management Telephonic RN Care Manager Note   05/19/2021 Name:  Shannon [name] MRN:  [id number] DOB:  08-26-1923  Summary: Shannon [name] call placed to member, successful.  Denies any urgent concerns, encouraged to contact this care manager with questions.    Recommendations/Changes made from today's visit: Continue daily monitoring of weights, blood pressure, and blood sugar, recording readings.  Stay active with home health for wound care.   Subjective: Shannon [name] is an 88 y.o. year old female who is a primary patient of Sharon Seller, NP. The care management team was consulted for assistance with care management and/or care coordination needs.    Telephonic RN Care Manager completed Telephone Visit today.  Objective:   Medications Reviewed Today     Reviewed by Louanne Belton, Christiana Pellant, CMA (Certified Medical Assistant) on 05/04/21 at 1426  Med List Status: <None>   Medication Order Taking? Sig Documenting Provider Last Dose Status Informant  acetaminophen (TYLENOL) 500 MG tablet [id number] Yes Take 500 mg by mouth every 6 (six) hours as needed for moderate pain. [provider] Taking Active Child  amLODipine (NORVASC) 5 MG tablet [id number] Yes TAKE 1 TABLET BY MOUTH EVERY DAY Sharon Seller, NP Taking Active   aspirin 81 MG EC tablet [id number] Yes Take 1 tablet (81 mg total) by mouth daily. Swallow whole. Leonie Douglas, MD Taking Active Child           Med Note Porfirio Mylar   Tue Feb 15, 2021 10:20 AM)    atorvastatin (LIPITOR) 20 MG tablet [id number] Yes Take 0.5 tablets (10 mg total) by mouth daily. Alba Cory, MD Taking Active   b complex vitamins tablet [id number] Yes Take 1 tablet by mouth daily.  [provider] Taking Active Child  cholecalciferol (VITAMIN D3) 25 MCG (1000 UNIT) tablet [id number] Yes Take 1,000 Units by mouth daily. [provider] Taking Active Child  cycloSPORINE (RESTASIS)  0.05 % ophthalmic emulsion [id number] Yes USE 1 DROP INTO BOTH EYES TWICE DAILY Kirt Boys, DO Taking Active Child           Med Note Porfirio Mylar   Tue Feb 15, 2021 10:21 AM)    doxycycline (VIBRA-TABS) 100 MG tablet [id number] Yes Take 1 tablet (100 mg total) by mouth daily. Felecia Shelling, DPM Taking Active   DULoxetine (CYMBALTA) 30 MG capsule [id number] Yes TAKE 1 CAPSULE BY MOUTH EVERY DAY Janyth Contes Janene Harvey, NP Taking Active Child           Med Note Porfirio Mylar   Tue Feb 15, 2021 10:21 AM)    ferrous sulfate 220 (44 Fe) MG/5ML solution [id number] Yes TAKE 7.4 MLS (325 MG TOTAL) BY MOUTH DAILY.  Patient taking differently: Place 220 mg into feeding tube daily.   Sharon Seller, NP Taking Active            Med Note [facility], CARLOS A   Wed May 04, 2021  2:21 PM)    fluticasone [facility]) 50 MCG/ACT nasal spray [id number] Yes 2 sprays into each nostril daily. Sharon Seller, NP Taking Active Child           Med Note Porfirio Mylar   Tue Feb 15, 2021 10:22 AM)    furosemide (LASIX) 40 MG tablet [id number] Yes TAKE 0.5-1 TABLETS (20-40 MG TOTAL) BY MOUTH SEE ADMIN INSTRUCTIONS. TAKE 20 MG DAILY, MAY INSTEAD TAKE 40 MG DAILY AS NEEDED FOR EDEMA  Meriam Sprague, MD Taking Active Child           Med Note Maxie Better Feb 15, 2021 10:22 AM)    gabapentin (NEURONTIN) 300 MG capsule [id number] Yes TAKE 1 CAPSULE BY MOUTH TWICE A DAY Sharon Seller, NP Taking Active Child           Med Note Porfirio Mylar   Tue Feb 15, 2021 10:22 AM)    gentamicin cream (GARAMYCIN) 0.1 % [id number] Yes Apply 1 application topically daily as needed (dry skin). Felecia Shelling, DPM Taking Active Child  hydrALAZINE (APRESOLINE) 50 MG tablet [id number] Yes Take 1 tablet (50 mg total) by mouth 3 (three) times daily. Meriam Sprague, MD Taking Active   Insulin Pen Needle (B-D UF III MINI PEN NEEDLES) 31G X 5 MM MISC [id number] Yes Use twice daily with giving insulin  injections. Dx E11.9 Sharon Seller, NP Taking Active Child  latanoprost (XALATAN) 0.005 % ophthalmic solution [id number] Yes Place 1 drop into both eyes at bedtime. [provider] Taking Active Child           Med Note Maxie Better Feb 15, 2021 10:23 AM)    levothyroxine (SYNTHROID) 125 MCG tablet [id number] Yes TAKE 1 TABLET BY MOUTH EVERY DAY Sharon Seller, NP Taking Active Child           Med Note Porfirio Mylar   Tue Feb 15, 2021 10:11 AM)    liver oil-zinc oxide (DESITIN) 40 % ointment [id number] Yes Apply topically 2 (two) times daily. Regalado, Belkys A, MD Taking Active   loratadine (CLARITIN) 10 MG tablet [id number] Yes Take 10 mg by mouth daily. [provider] Taking Active Child  mometasone-formoterol [facility]) 200-5 MCG/ACT AERO [id number] Yes INHALE TWO PUFFS BY MOUTH TWICE DAILY Sharon Seller, NP Taking Active Child           Med Note [facility], CARLOS A   Wed May 04, 2021  2:26 PM)    Multiple Vitamin (MULTIVITAMIN WITH MINERALS) TABS tablet [id number] Yes Take 1 tablet by mouth daily. [provider] Taking Active Child  Nutritional Supplements (FEEDING SUPPLEMENT, OSMOLITE 1.5 CAL,) LIQD [id number] Yes Place 711 mLs into feeding tube See admin instructions. Take 711 ml over a 12 hour period per day 9pm-9am at a rate of 65 [provider] Taking Active Child  nystatin cream (MYCOSTATIN) [id number] Yes APPLY TO AFFECTED AREA TWICE A DAY Sharon Seller, NP Taking Active Child           Med Note Worthy Rancher, CHASIE F   Tue Feb 15, 2021 10:23 AM)    oxyCODONE (ROXICODONE) 5 MG immediate release tablet [id number] Yes Take 0.5-1 tablets (2.5-5 mg total) by mouth every 8 (eight) hours as needed for severe pain. Sharon Seller, NP Taking Active Child  pantoprazole (PROTONIX) 40 MG tablet [id number] Yes Take 1 tablet (40 mg total) by mouth 2 (two) times daily. Sharon Seller, NP Taking Active Child           Med Note  Porfirio Mylar   Tue Feb 15, 2021 10:17 AM)    Polyethyl Glycol-Propyl Glycol (SYSTANE) 0.4-0.3 % GEL ophthalmic gel [id number] Yes Place 1 application into both eyes 2 (two) times daily as needed (moisture). [provider] Taking Active Child  Probiotic Product (EQL PROBIOTIC COLON SUPPORT PO) [id number] Yes Take 1 capsule by mouth daily. [provider] Taking Active  Child  senna-docusate (SENOKOT-S) 8.6-50 MG tablet [id number] Yes Take 1 tablet by mouth at bedtime. [provider] Taking Active Child  Sodium Chloride-Sodium Bicarb (NETI POT SINUS WASH NA) [id number] Yes Place 1 application into the nose at bedtime. [provider] Taking Active Child           Med Note [facility] MENDEZ, CARLOS A   Wed May 04, 2021  2:26 PM)    sucralfate (CARAFATE) 1 GM/10ML suspension [id number] Yes Take 10 mLs (1 g total) by mouth every 6 (six) hours. Regalado, Prentiss Bells, MD Taking Active   traMADol (ULTRAM) 50 MG tablet [id number] Yes Place 1 tablet (50 mg total) into feeding tube every 6 (six) hours as needed for severe pain. Lars Mage, PA-C Taking Active Child  Water For Irrigation, Sterile (FREE WATER) Criss Rosales [id number] Yes Place 180 mLs into feeding tube 3 (three) times daily. [provider] Taking Active Child  White Petrolatum-Mineral Oil (SYSTANE NIGHTTIME) OINT [id number] Yes Place 1 application into both eyes at bedtime.  [provider] Taking Active Child             SDOH:  (Social Determinants of Health) assessments and interventions performed:     Care Plan  Review of patient past medical history, allergies, medications, health status, including review of consultants reports, laboratory and other test data, was performed as part of comprehensive evaluation for care management services.   Care Plan : General Plan of Care (Adult)  Updates made by Kemper Durie, RN since 05/19/2021 12:00 AM     Problem: Difficulty managing chronic  medical conditions (CHF, DM, HTN, PVD)   Priority: High     Long-Range Goal: Member and family will report adequate management of chronic health conditions (CHF, DM, HTN, PVD)   Start Date: 02/21/2021  Expected End Date: 08/21/2021  This Visit's Progress: On track  Recent Progress: On track  Priority: High  Note:   Current Barriers:  Chronic Disease Management support and education needs related to CHF, HTN, DMII, and PVD  RNCM Clinical Goal(s):  Patient will verbalize understanding of plan for management of CHF, HTN, DMII, and PVD as evidenced by member/family verbalizing stability in member's status take all medications exactly as prescribed and will call provider for medication related questions as evidenced by family reporting adherence    attend all scheduled medical appointments: PCP and Podiatry as evidenced by record of attendance        continue to work with RN Care Manager and/or Social Worker to address care management and care coordination needs related to CHF, HTN, DMII, and PVD as evidenced by adherence to CM Team Scheduled appointments     not experience hospital admission as evidenced by review of EMR. Hospital Admissions in last 6 months = 2  through collaboration with RN Care manager, provider, and care team.   Interventions: Inter-disciplinary care team collaboration (see longitudinal plan of care) Evaluation of current treatment plan related to  self management and patient's adherence to plan as established by provider   Heart Failure Interventions:  (Status: Goal on Track (progressing): YES.)  Long Term Goal  Basic overview and discussion of pathophysiology of Heart Failure reviewed Provided education on low sodium diet Advised patient to weigh each morning after emptying bladder Discussed importance of daily weight and advised patient to weigh and record daily  Diabetes:  (Status: Goal on Track (progressing): YES.) Long Term Goal   Lab Results  Component Value  Date   HGBA1C 5.4  03/07/2021  Assessed patient's understanding of A1c goal: <7% Discussed plans with patient for ongoing care management follow up and provided patient with direct contact information for care management team;      Provided patient with written educational materials related to hypo and hyperglycemia and importance of correct treatment;       Advised patient, providing education and rationale, to check cbg three times daily and record         PVD  (Status: Goal on track: NO.) Long Term Goal  Evaluation of current treatment plan related to  PVD ,  self-management and patient's adherence to plan as established by provider. Discussed plans with patient for ongoing care management follow up and provided patient with direct contact information for care management team Provided education to patient re: wound care; Reviewed scheduled/upcoming provider appointments including podiatry; Discussed plans with patient for ongoing care management follow up and provided patient with direct contact information for care management team;  Hypertension: (Status: Goal on Track (progressing): YES.) Last practice recorded BP readings:  BP Readings from Last 3 Encounters:  05/04/21 140/60  03/09/21 130/68  02/16/21 (!) 169/66  Most recent eGFR/CrCl:  Lab Results  Component Value Date   EGFR 32 (L) 05/04/2021    No components found for: CRCL  Provided education to patient re: stroke prevention, s/s of heart attack and stroke; Reviewed prescribed diet low salt Discussed plans with patient for ongoing care management follow up and provided patient with direct contact information for care management team; Advised patient, providing education and rationale, to monitor blood pressure daily and record, calling PCP for findings outside established parameters;   Patient Goals/Self-Care Activities: Take medications as prescribed   Attend all scheduled provider appointments Call provider office for  new concerns or questions  watch for swelling in feet, ankles and legs every day weigh myself daily track symptoms and what helps feel better or worse check blood sugar at prescribed times: three times daily check feet daily for cuts, sores or redness enter blood sugar readings and medication or insulin into daily log take the blood sugar meter to all doctor visits   Update 11/22 - Along with above listed conditions, member also has had admissions for anemia from GI bleed, recently discharged on 11/16.  Medications changed (Plavix stopped and Carafate started).  She will follow up with PCP on 12/7. Also continues to have wounds on feet post transmetatarsal amputations, follow up with podiatry on 12/5.  She is working to regain her strength, does not feel PT is needed.   Per daughter, she feel she could use more help now as member's condition has steadily declined over the last several months.  Will send list of agencies for in home aide assistance, daughter aware there is an out of pocket fee as member does not have Medicaid.   Update 12/19 - Member was seen by podiatrist on 12/5, continues to have non-healing foot wound with drainage, ongoing dressing changes.  She now has home health nursing restarted for wound care of foot as well as new sacral wound.  Educated on ways to alleviate pressure and decrease pain.  She was placed on antibiotics, wondering if she will need this reordered for longer term use.  She will have home health nurse examine it tomorrow and contact provider for orders if needed.  Will follow up with podiatrist on 1/11.  Was also seen by PCP on 12/7, follow up on 3/10.  Cardiology appointment scheduled for 2/1.  She  report ongoing weight loss, now down to 120 pounds and decreased appetite.  She is working with her family on full liquids that she will be able to tolerate along with tube feeding.   Update 04/21/2021 - Member report feeling weak over the last week, HH nurse called PCP  and podiatry office as she was showing signs of recurrent infection.  Foot wound still not healing, draining and tunneling per notes in chart.  Member was seen this week by podiatry, antibiotic was given to take indefinitely, member has decided not to proceed with further surgeries.  She will continue to have wound care in the home, does not feel PT would be beneficial as she state she is already starting to feel better  She will contact this care manager should she change her mind. She will keep appointments with PCP, cardiology, and podiatry in the future.     Update 2/16 - Spoke with member, state she is "ok."  Report she does not feel as strong as she had been in the past.  Unable to pinpoint anything specific but feels she has gotten weaker over the last several months.  She has been seen by cardiology and podiatry since last outreach.  Per cardiology note, she complained of chest fullness which may be related to constipation.  She has regime for constipation that she will continue, no other changes to plan of care, recommended follow up in 6 months.  She was unable to get her usual Osmolite and has been using an alternative, causing her to be on the feeding pump for a longer period of time to get the same nutrients.  Per podiatry note and member's report, she continues to have non-healing wounds on feet, does not want any further surgery on her feet.  She  will continue to see podiatry monthly for noninvasive treatment in the office.  She is still taking antibiotics and home health for wound care remain active, next visit is today.  She does continue to monitor weights, blood sugar, and blood pressure daily, no readings to day as she was still in bed during the call.  Report blood pressure has ranged 120-140s/70s, denies weight gain.          Plan:  Telephone follow up appointment with care management team member scheduled for:  1 month The patient has been provided with contact information for the  care management team and has been advised to call with any health related questions or concerns.   Kemper Durie, RN, MSN, CCM [facility] Care Management  [facility] Manager [phone]

## 2021-05-25 DIAGNOSIS — J9610 Chronic respiratory failure, unspecified whether with hypoxia or hypercapnia: Secondary | ICD-10-CM | POA: Diagnosis not present

## 2021-05-25 DIAGNOSIS — E1151 Type 2 diabetes mellitus with diabetic peripheral angiopathy without gangrene: Secondary | ICD-10-CM | POA: Diagnosis not present

## 2021-05-25 DIAGNOSIS — E1169 Type 2 diabetes mellitus with other specified complication: Secondary | ICD-10-CM | POA: Diagnosis not present

## 2021-05-25 DIAGNOSIS — M86172 Other acute osteomyelitis, left ankle and foot: Secondary | ICD-10-CM | POA: Diagnosis not present

## 2021-05-25 DIAGNOSIS — E1142 Type 2 diabetes mellitus with diabetic polyneuropathy: Secondary | ICD-10-CM | POA: Diagnosis not present

## 2021-05-25 DIAGNOSIS — E039 Hypothyroidism, unspecified: Secondary | ICD-10-CM | POA: Diagnosis not present

## 2021-05-27 IMAGING — CT CT ABD-PELV W/ CM
2 of 5 series · 15 of 46 positions shown, 17 images · IV contrast (omnipaque)
Comparison: CT scan of the abdomen dated 06/06/2017 and abdominal
radiographs dated 12/25/2018

CLINICAL DATA: Acute abdominal pain and rectal pain. Blood in
stools for 1 week.

EXAM:
CT ABDOMEN AND PELVIS WITH CONTRAST
TECHNIQUE: Multidetector CT imaging of the abdomen and pelvis was performed
using the standard protocol following bolus administration of
intravenous contrast.
CONTRAST:  80mL OMNIPAQUE IOHEXOL 300 MG/ML  SOLN

[Series 2: axial st · axial · 0.72mm/px · z∈[+1063,+1433]mm · 12 of 88 slices shown, 14 images]
[im 7/88  soft-tissue]
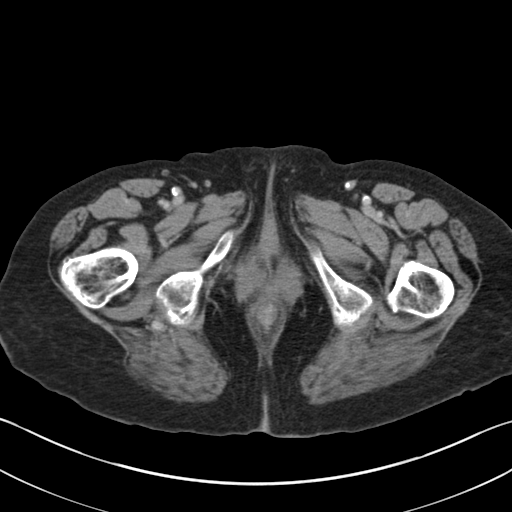
[im 7/88  bone]
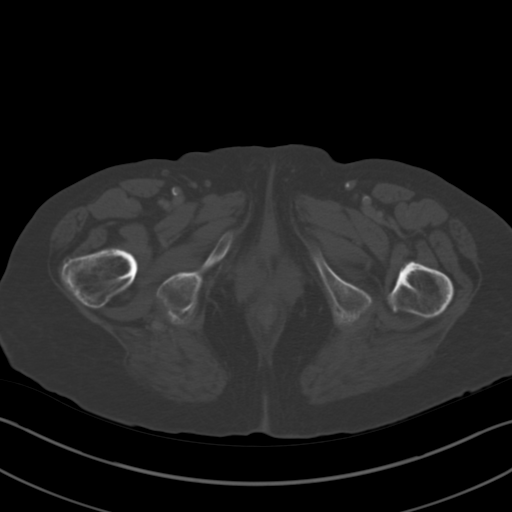
[im 14/88  soft-tissue]
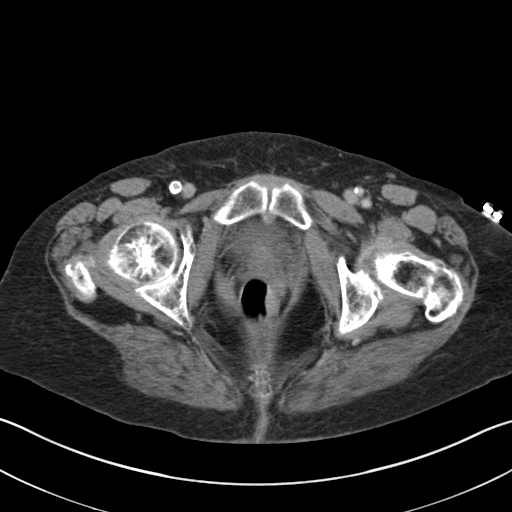
[im 21/88  soft-tissue]
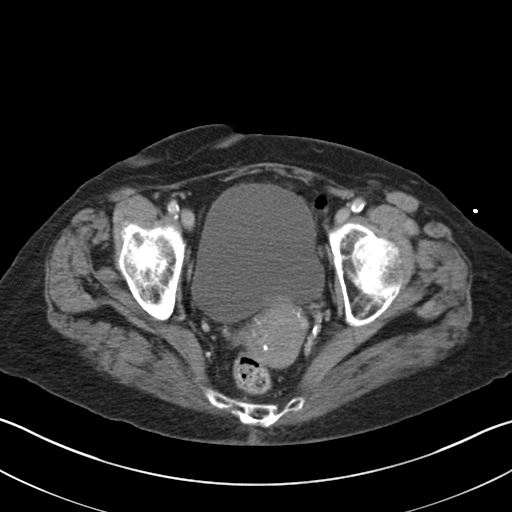
[im 27/88  soft-tissue]
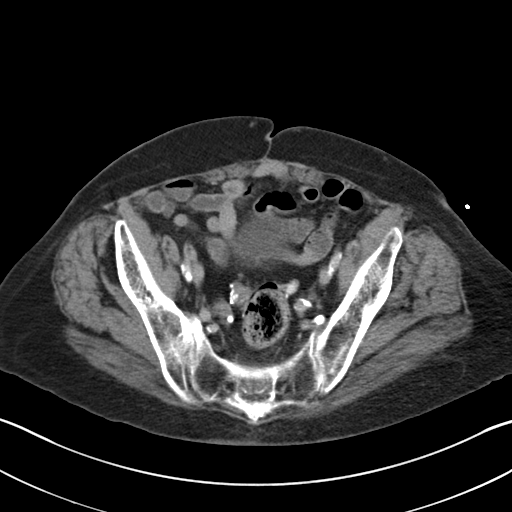
[im 34/88  soft-tissue]
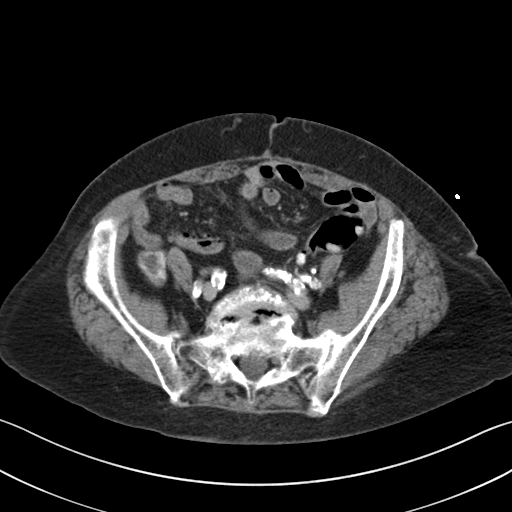
[im 41/88  soft-tissue]
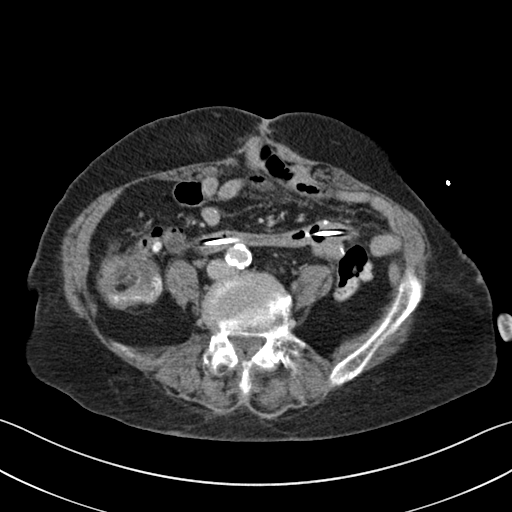
[im 47/88  soft-tissue]
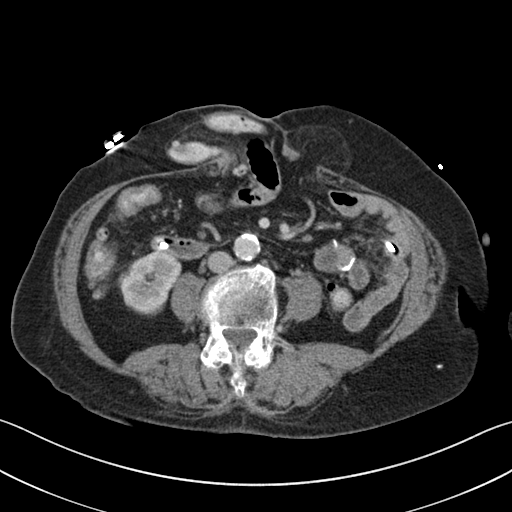
[im 54/88  soft-tissue]
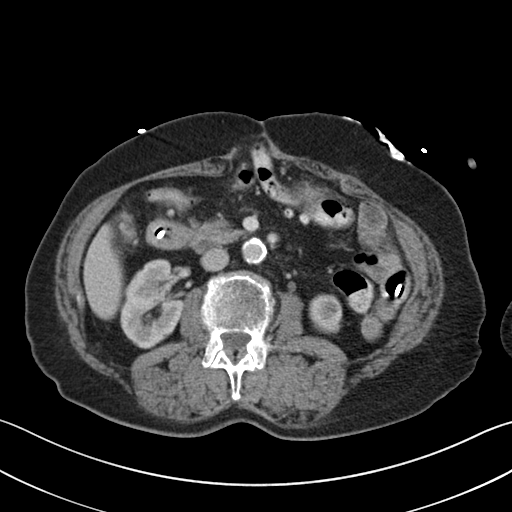
[im 61/88  soft-tissue]
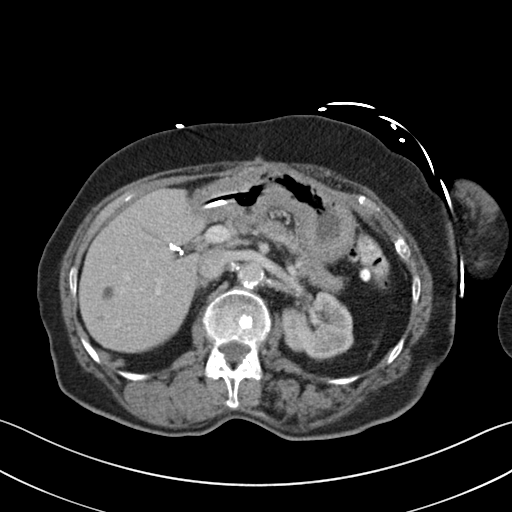
[im 61/88  bone]
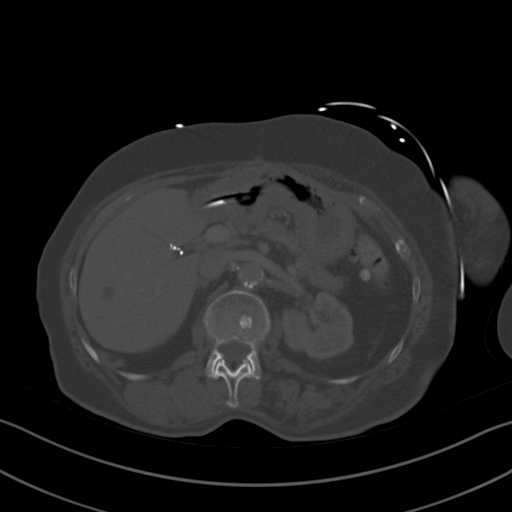
[im 67/88  soft-tissue]
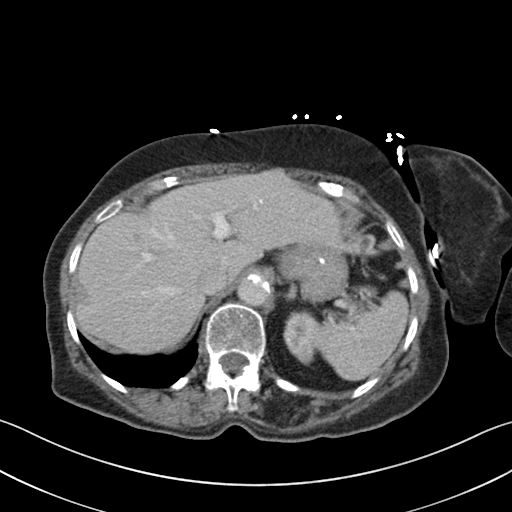
[im 74/88  soft-tissue]
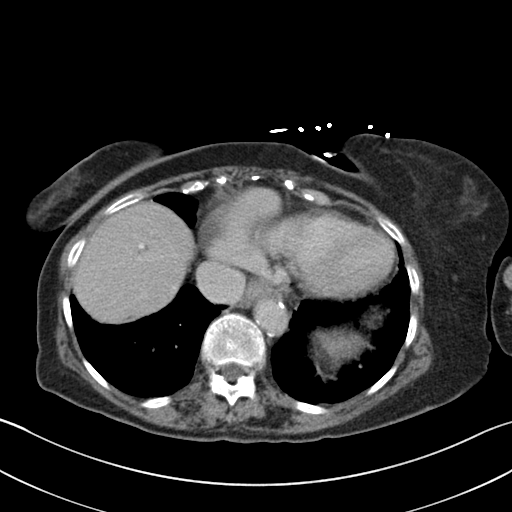
[im 81/88  soft-tissue]
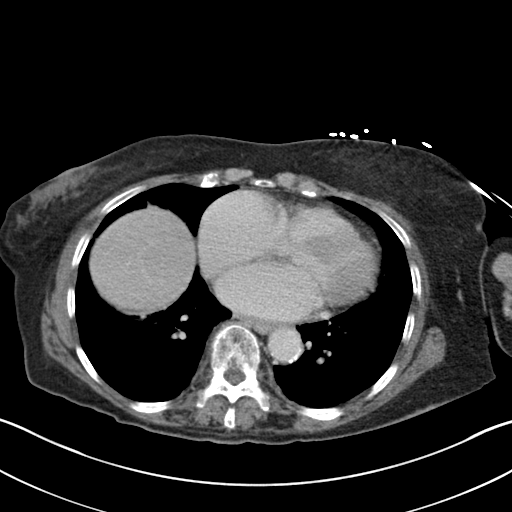

[Series 5: coronal st · coronal · 0.63mm/px · 3 of 126 slices shown]
[im 42/126  soft-tissue]
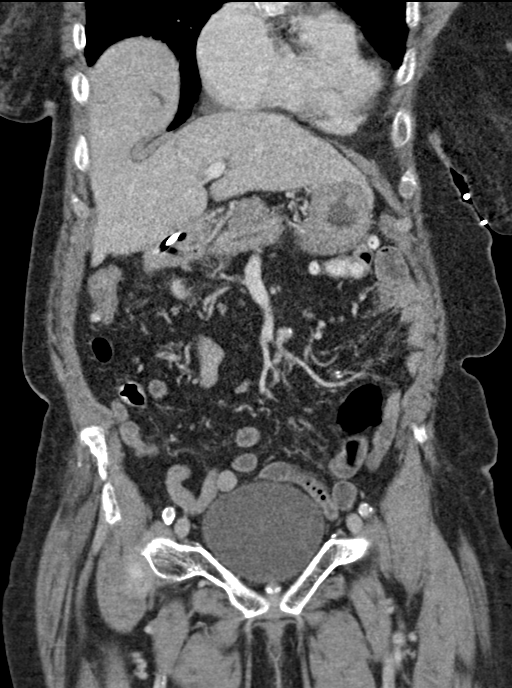
[im 56/126  soft-tissue]
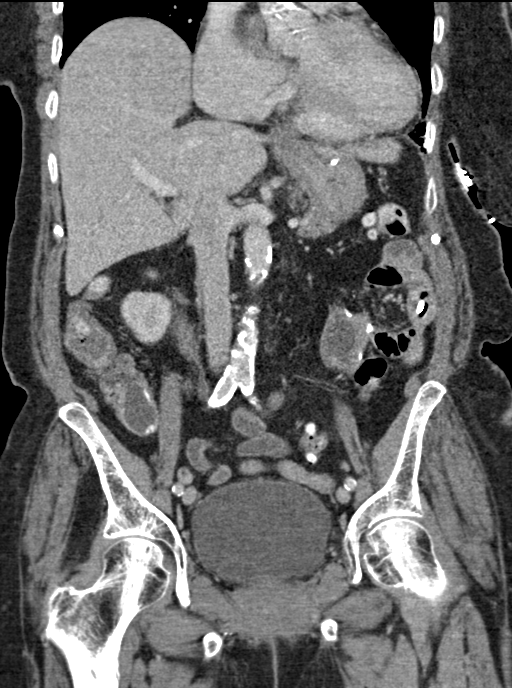
[im 70/126  soft-tissue]
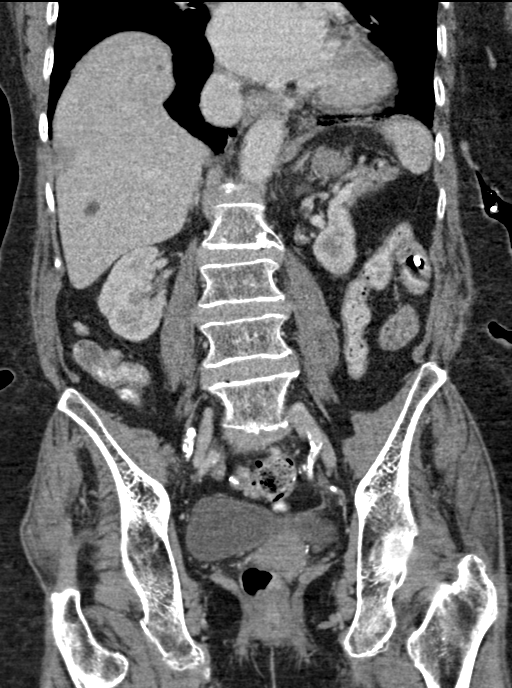

[15 of 46 positions shown; findings below may reference images not displayed]

FINDINGS: Lower chest: No acute abnormality. Aortic atherosclerosis.

Hepatobiliary: Multiple small hepatic cysts, unchanged.
Cholecystectomy. No dilated bile ducts.

Pancreas: Unremarkable. No pancreatic ductal dilatation or
surrounding inflammatory changes.

Spleen: Normal in size without focal abnormality.

Adrenals/Urinary Tract: Adrenal glands and kidneys are normal except
for a 12 mm cyst on the lower pole of the left kidney. No
hydronephrosis. Bladder is normal.

Stomach/Bowel: There are diverticula throughout the colon.
Gastrostomy tube in place. The tip of the tube is in the proximal
jejunum in the left upper quadrant. No dilated bowel. Appendix is
not visualized. Surgical staple lines noted in several areas of the
bowel.

Vascular/Lymphatic: Aortic atherosclerosis. No enlarged abdominal or
pelvic lymph nodes.

Reproductive: Uterus and bilateral adnexa are unremarkable.

Other: There are periumbilical hernias to the right and left of
midline. The hernia to the right contains large and small bowel
loops without evidence of obstruction. The hernia to the left of
midline contains only peritoneal fat. No significant change since
the prior study.

Musculoskeletal: No acute abnormalities. Degenerative disc and joint
disease in the lower lumbar spine. Old mild compression fracture of
T11.
IMPRESSION: 1. No acute abnormalities of the abdomen or pelvis.
2. Diverticulosis of the colon.
3. Periumbilical hernias to the right and left of midline containing
large and small bowel without evidence of obstruction.
4. Aortic atherosclerosis.

Aortic Atherosclerosis (R97V0-7CE.E).

## 2021-05-27 IMAGING — CR DG ABDOMEN ACUTE W/ 1V CHEST
3 series · 3 of 3 positions shown · non-contrast
Comparison: June 06, 2017

CLINICAL DATA: Constipation

EXAM:
DG ABDOMEN ACUTE W/ 1V CHEST

[x chest ap]
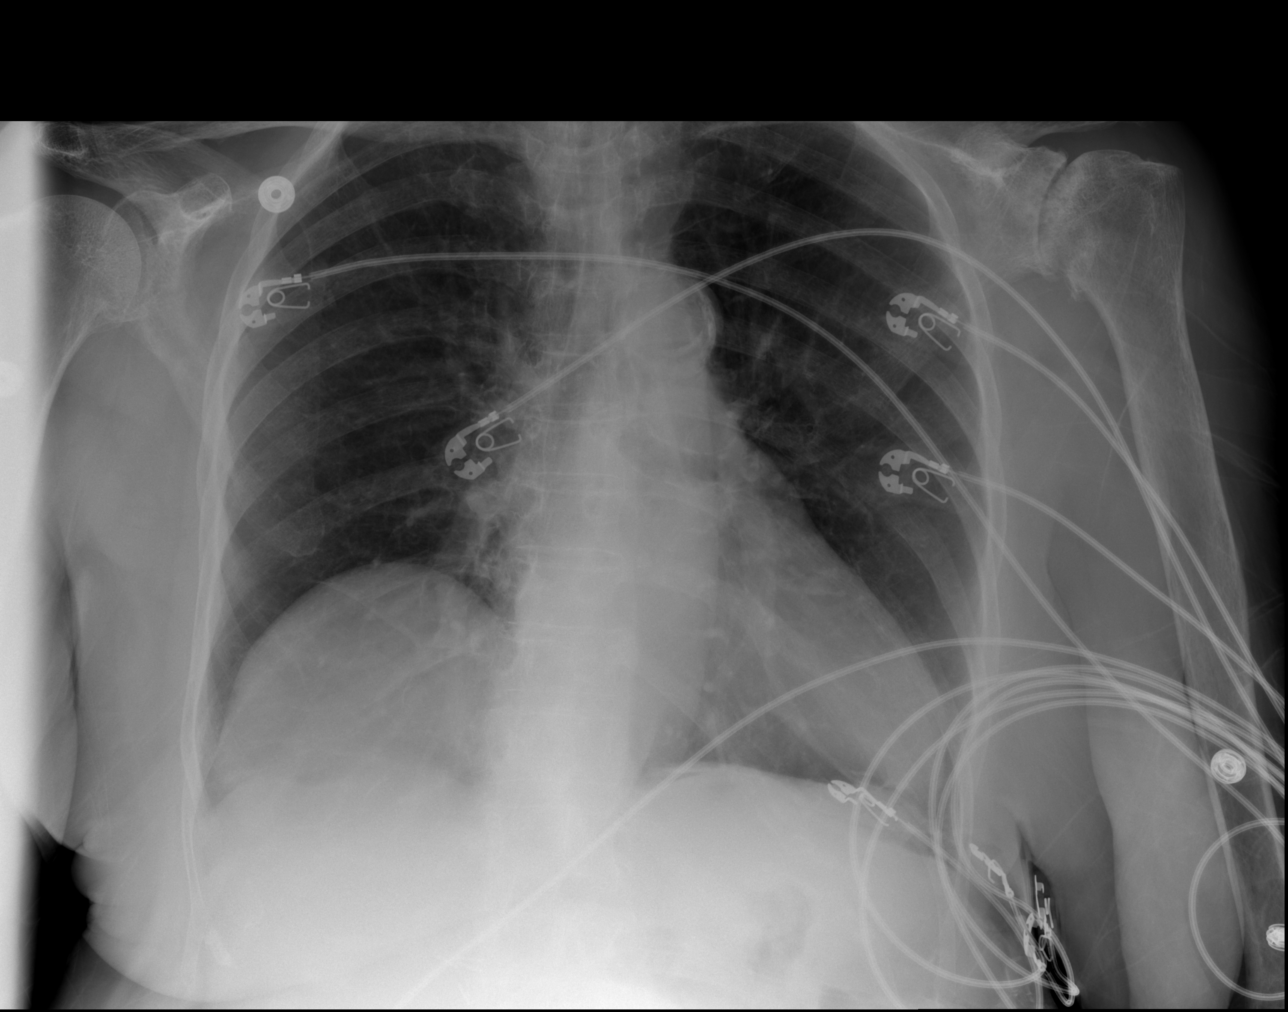

[x abdomen supine]
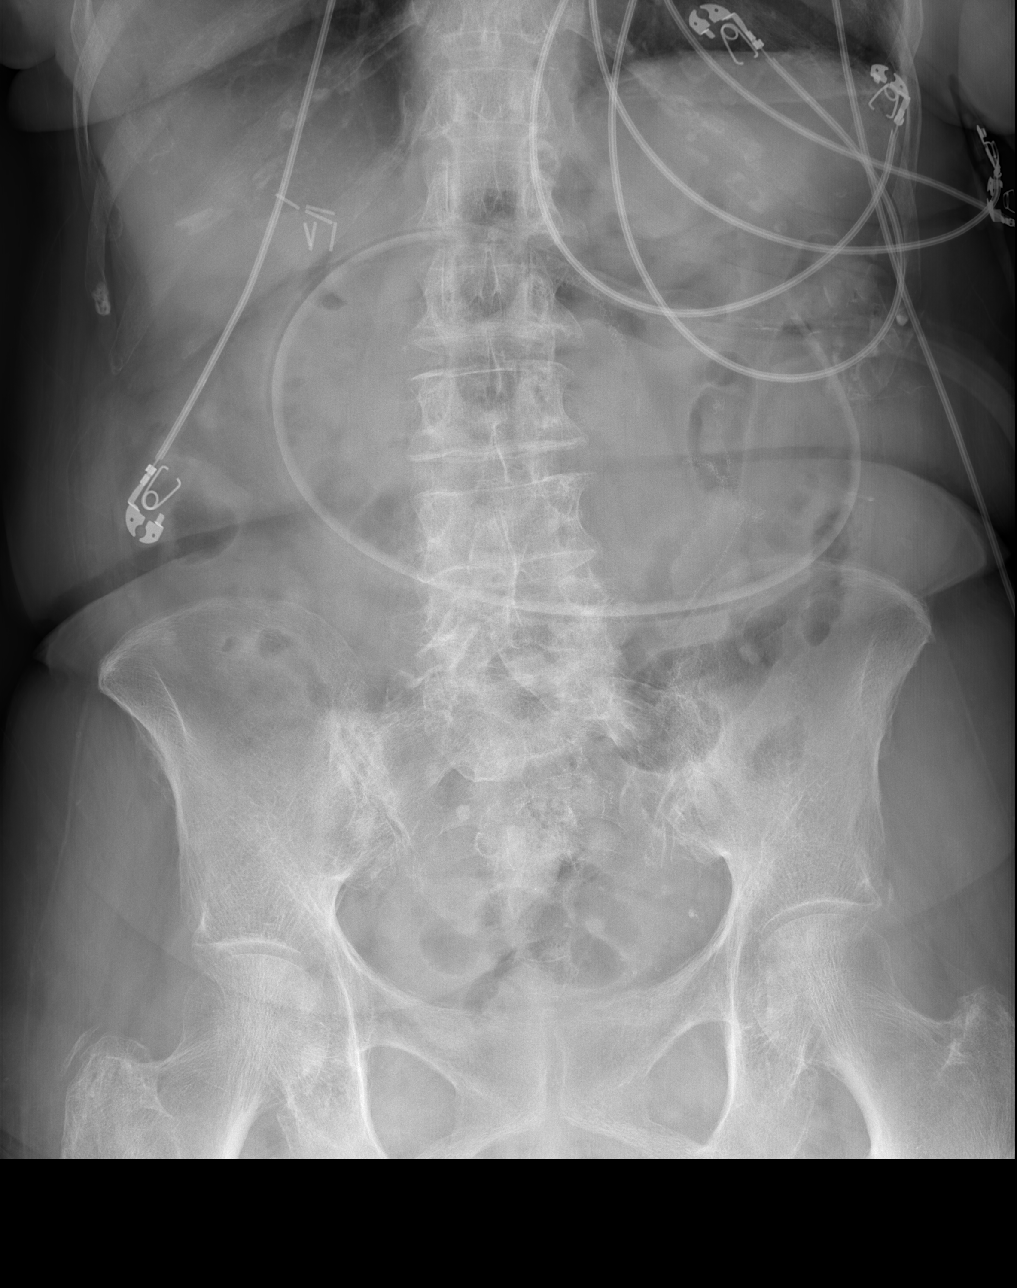

[w abdomen decub]
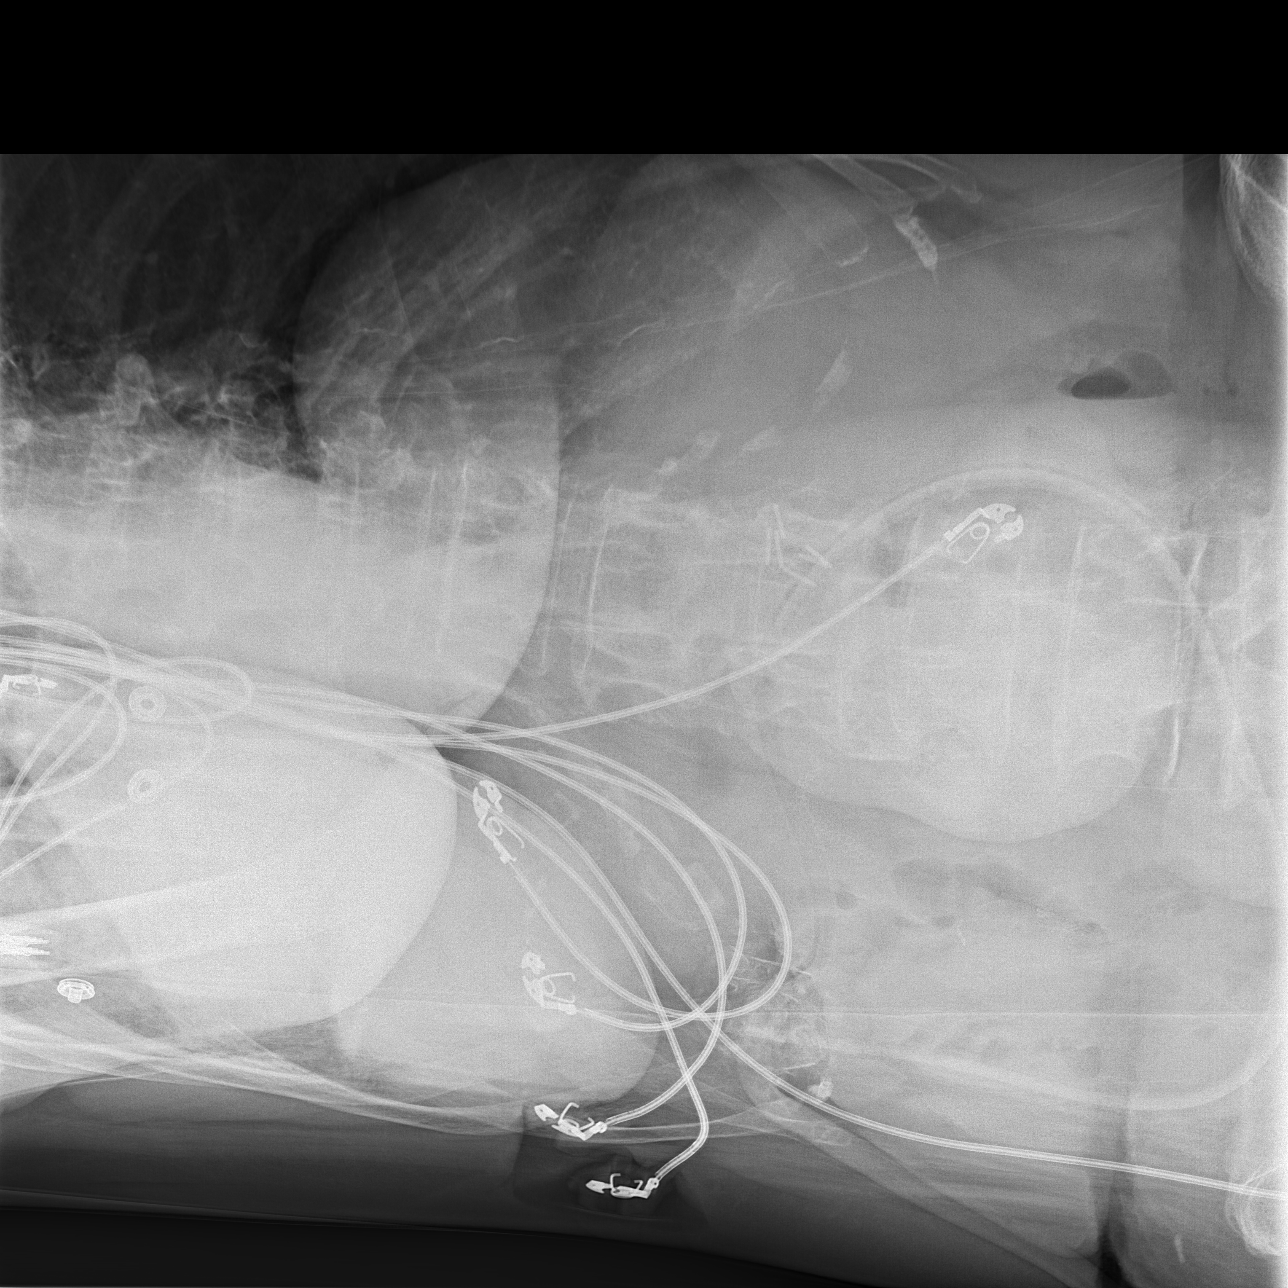

[3 of 3 positions shown; findings below may reference images not displayed]

FINDINGS: There is elevation of the right hemidiaphragm. There is no
pneumothorax. No large pleural effusion. No focal infiltrate. Aortic
calcifications are noted. The bowel gas pattern is nonobstructive. A
gastrojejunostomy is noted. The tube appears well position. Surgical
staples are noted in the left lower quadrant. There are degenerative
changes throughout the visualized portions of the thoracolumbar
spine.
IMPRESSION: 1. No evidence of acute cardiopulmonary process.
2. Nonobstructive bowel gas pattern.
3. Stable elevation of the right hemidiaphragm.
4. Gastrojejunostomy tube appears well position.

## 2021-05-30 ENCOUNTER — Other Ambulatory Visit: Payer: Self-pay | Admitting: Nurse Practitioner

## 2021-05-30 DIAGNOSIS — B372 Candidiasis of skin and nail: Secondary | ICD-10-CM

## 2021-05-30 DIAGNOSIS — H16143 Punctate keratitis, bilateral: Secondary | ICD-10-CM | POA: Diagnosis not present

## 2021-05-30 DIAGNOSIS — E113291 Type 2 diabetes mellitus with mild nonproliferative diabetic retinopathy without macular edema, right eye: Secondary | ICD-10-CM | POA: Diagnosis not present

## 2021-05-30 DIAGNOSIS — H401130 Primary open-angle glaucoma, bilateral, stage unspecified: Secondary | ICD-10-CM | POA: Diagnosis not present

## 2021-05-30 LAB — HM DIABETES EYE EXAM

## 2021-05-31 ENCOUNTER — Other Ambulatory Visit (HOSPITAL_COMMUNITY): Payer: Self-pay | Admitting: Physician Assistant

## 2021-05-31 ENCOUNTER — Other Ambulatory Visit: Payer: Self-pay

## 2021-05-31 ENCOUNTER — Ambulatory Visit (HOSPITAL_COMMUNITY)
Admission: RE | Admit: 2021-05-31 | Discharge: 2021-05-31 | Disposition: A | Discharge status: Home / Self Care | Payer: Medicare Other | Source: Ambulatory Visit | Attending: Physician Assistant | Admitting: Physician Assistant

## 2021-05-31 DIAGNOSIS — I5032 Chronic diastolic (congestive) heart failure: Secondary | ICD-10-CM | POA: Diagnosis not present

## 2021-05-31 DIAGNOSIS — E1151 Type 2 diabetes mellitus with diabetic peripheral angiopathy without gangrene: Secondary | ICD-10-CM | POA: Diagnosis not present

## 2021-05-31 DIAGNOSIS — R633 Feeding difficulties, unspecified: Secondary | ICD-10-CM

## 2021-05-31 DIAGNOSIS — K9413 Enterostomy malfunction: Secondary | ICD-10-CM | POA: Diagnosis not present

## 2021-05-31 DIAGNOSIS — I130 Hypertensive heart and chronic kidney disease with heart failure and stage 1 through stage 4 chronic kidney disease, or unspecified chronic kidney disease: Secondary | ICD-10-CM | POA: Diagnosis not present

## 2021-05-31 DIAGNOSIS — Z86718 Personal history of other venous thrombosis and embolism: Secondary | ICD-10-CM | POA: Insufficient documentation

## 2021-05-31 DIAGNOSIS — N183 Chronic kidney disease, stage 3 unspecified: Secondary | ICD-10-CM | POA: Insufficient documentation

## 2021-05-31 DIAGNOSIS — K9423 Gastrostomy malfunction: Secondary | ICD-10-CM | POA: Diagnosis not present

## 2021-05-31 DIAGNOSIS — E1122 Type 2 diabetes mellitus with diabetic chronic kidney disease: Secondary | ICD-10-CM | POA: Insufficient documentation

## 2021-05-31 HISTORY — PX: IR REPLC GASTRO/COLONIC TUBE PERCUT W/FLUORO: IMG2333

## 2021-05-31 MED ORDER — IOHEXOL 300 MG/ML  SOLN
50.0000 mL | Freq: Once | INTRAMUSCULAR | Status: AC | PRN
  Administered 2021-05-31: 15 mL

## 2021-05-31 MED ORDER — LIDOCAINE VISCOUS HCL 2 % MT SOLN
OROMUCOSAL | Status: AC
  Filled 2021-05-31: qty 15

## 2021-05-31 NOTE — H&P (Signed)
Vascular and Interventional Radiology  PRE PROCEDURE H&P  Assessment   Plan:   Ms. KAYZLEE WIRTANEN is a 88 y.o. year old female who will undergo GASTROJEJUNOSTOMY TUBE EXCHANGE in Interventional Radiology.  The procedure has been fully reviewed with the patient/patients authorized representative. The risks, benefits and alternatives have been explained, and the patient/patients authorized representative has consented to the procedure.  HPI: Ms. MATTISEN POHLMANN is a 88 y.o. year old female comorbid with PMHx significant for prior DVT, GIB, PVD and pyloric stenosis s/p G-J placement  in 2017. Pt's Daughter reports that her tube cracked. They report to only come in when the catheter malfunctions. Last exchange was 03/24/21.  Informed consent was obtained, witnessed and placed in the patient's chart.  Allergies:  Allergies  Allergen Reactions   Penicillins Itching, Rash and Other (See Comments)    Tolerated amoxicillin, unasyn, zosyn & cephalosporins in the past. Did it involve swelling of the face/tongue/throat, SOB, or low BP? No Did it involve sudden or severe rash/hives, skin peeling, or any reaction on the inside of your mouth or nose? No Did you need to seek medical attention at a hospital or doctor's office? Unknown When did it last happen?      5+ years If all above answers are "NO", may proceed with cephalosporin use.     Medications:  Current Outpatient Medications on File Prior to Encounter  Medication Sig Dispense Refill   acetaminophen (TYLENOL) 500 MG tablet Take 500 mg by mouth every 6 (six) hours as needed for moderate pain.     amLODipine (NORVASC) 5 MG tablet TAKE 1 TABLET BY MOUTH EVERY DAY 90 tablet 3   aspirin 81 MG EC tablet Take 1 tablet (81 mg total) by mouth daily. Swallow whole. 150 tablet 2   atorvastatin (LIPITOR) 20 MG tablet Take 0.5 tablets (10 mg total) by mouth daily. 30 tablet 3   b complex vitamins tablet Take 1 tablet by mouth daily.      cholecalciferol  (VITAMIN D3) 25 MCG (1000 UNIT) tablet Take 1,000 Units by mouth daily.     cycloSPORINE (RESTASIS) 0.05 % ophthalmic emulsion USE 1 DROP INTO BOTH EYES TWICE DAILY 60 mL 3   doxycycline (VIBRA-TABS) 100 MG tablet Take 1 tablet (100 mg total) by mouth daily. 14 tablet 1   DULoxetine (CYMBALTA) 30 MG capsule TAKE 1 CAPSULE BY MOUTH EVERY DAY 90 capsule 2   ferrous sulfate 220 (44 Fe) MG/5ML solution TAKE 7.4 MLS (325 MG TOTAL) BY MOUTH DAILY. (Patient taking differently: Place 220 mg into feeding tube daily.) 150 mL 11   fluticasone (FLONASE) 50 MCG/ACT nasal spray 2 sprays into each nostril daily. 48 mL 2   furosemide (LASIX) 40 MG tablet TAKE 0.5-1 TABLETS (20-40 MG TOTAL) BY MOUTH SEE ADMIN INSTRUCTIONS. TAKE 20 MG DAILY, MAY INSTEAD TAKE 40 MG DAILY AS NEEDED FOR EDEMA 30 tablet 8   gabapentin (NEURONTIN) 300 MG capsule TAKE 1 CAPSULE BY MOUTH TWICE A DAY 180 capsule 1   gentamicin cream (GARAMYCIN) 0.1 % Apply 1 application topically daily as needed (dry skin). 30 g 1   hydrALAZINE (APRESOLINE) 50 MG tablet Take 1 tablet (50 mg total) by mouth 3 (three) times daily. 270 tablet 0   Insulin Pen Needle (B-D UF III MINI PEN NEEDLES) 31G X 5 MM MISC Use twice daily with giving insulin injections. Dx E11.9 300 each 3   latanoprost (XALATAN) 0.005 % ophthalmic solution Place 1 drop into both eyes at  bedtime.     levothyroxine (SYNTHROID) 125 MCG tablet TAKE 1 TABLET BY MOUTH EVERY DAY 90 tablet 3   liver oil-zinc oxide (DESITIN) 40 % ointment Apply topically 2 (two) times daily. 56.7 g 0   loratadine (CLARITIN) 10 MG tablet Take 10 mg by mouth daily.     mometasone-formoterol (DULERA) 200-5 MCG/ACT AERO INHALE TWO PUFFS BY MOUTH TWICE DAILY 13 each 5   Multiple Vitamin (MULTIVITAMIN WITH MINERALS) TABS tablet Take 1 tablet by mouth daily.     Nutritional Supplements (FEEDING SUPPLEMENT, OSMOLITE 1.5 CAL,) LIQD Place 711 mLs into feeding tube See admin instructions. Take 711 ml over a 12 hour period per  day 9pm-9am at a rate of 65     nystatin cream (MYCOSTATIN) APPLY TO AFFECTED AREA TWICE A DAY 30 g 11   oxyCODONE (ROXICODONE) 5 MG immediate release tablet Take 0.5-1 tablets (2.5-5 mg total) by mouth every 8 (eight) hours as needed for severe pain. 30 tablet 0   pantoprazole (PROTONIX) 40 MG tablet Take 1 tablet (40 mg total) by mouth 2 (two) times daily. 180 tablet 2   Polyethyl Glycol-Propyl Glycol (SYSTANE) 0.4-0.3 % GEL ophthalmic gel Place 1 application into both eyes 2 (two) times daily as needed (moisture).     Probiotic Product (EQL PROBIOTIC COLON SUPPORT PO) Take 1 capsule by mouth daily.     senna-docusate (SENOKOT-S) 8.6-50 MG tablet Take 1 tablet by mouth at bedtime.     Sodium Chloride-Sodium Bicarb (NETI POT SINUS [place] NA) Place 1 application into the nose at bedtime.     sucralfate (CARAFATE) 1 GM/10ML suspension Take 10 mLs (1 g total) by mouth every 6 (six) hours. 420 mL 0   traMADol (ULTRAM) 50 MG tablet Place 1 tablet (50 mg total) into feeding tube every 6 (six) hours as needed for severe pain. 30 tablet 0   Water For Irrigation, Sterile (FREE WATER) SOLN Place 180 mLs into feeding tube 3 (three) times daily.     White Petrolatum-Mineral Oil (SYSTANE NIGHTTIME) OINT Place 1 application into both eyes at bedtime.      No current facility-administered medications on file prior to encounter.    PSH:  Past Surgical History:  Procedure Laterality Date   ABDOMINAL AORTOGRAM N/A 09/21/2016   Procedure: Abdominal Aortogram;  Surgeon: Waynetta Sandy, MD;  Location: [place] [facility];  Service: Cardiovascular;  Laterality: N/A;   ABDOMINAL AORTOGRAM W/LOWER EXTREMITY N/A 10/20/2020   Procedure: ABDOMINAL AORTOGRAM W/LOWER EXTREMITY;  Surgeon: Cherre Robins, MD;  Location: [place] [facility];  Service: Cardiovascular;  Laterality: N/A;   AMPUTATION Left 09/25/2016   Procedure: AMPUTATION DIGIT- LEFT 2ND AND 3RD TOES;  Surgeon: Rosetta Posner, MD;  Location: [facility];   Service: Vascular;  Laterality: Left;   AMPUTATION Right 05/03/2018   Procedure: AMPUTATION RIGHT GREAT TOE;  Surgeon: Rosetta Posner, MD;  [facility];  Service: Vascular;  Laterality: Right;   CATARACT EXTRACTION W/ INTRAOCULAR LENS  IMPLANT, BILATERAL     CHOLECYSTECTOMY OPEN     COLECTOMY     ESOPHAGOGASTRODUODENOSCOPY N/A 01/19/2014   Procedure: ESOPHAGOGASTRODUODENOSCOPY (EGD);  Surgeon: Irene Shipper, MD;  Location: [facility];  Service: Endoscopy;  Laterality: N/A;   ESOPHAGOGASTRODUODENOSCOPY N/A 01/20/2014   Procedure: ESOPHAGOGASTRODUODENOSCOPY (EGD);  Surgeon: Irene Shipper, MD;  Location: [facility];  Service: Endoscopy;  Laterality: N/A;   ESOPHAGOGASTRODUODENOSCOPY N/A 03/19/2014   Procedure: ESOPHAGOGASTRODUODENOSCOPY (EGD);  Surgeon: Milus Banister, MD;  Location: [facility];  Service: Endoscopy;  Laterality: N/A;   ESOPHAGOGASTRODUODENOSCOPY N/A 07/08/2015   Procedure: ESOPHAGOGASTRODUODENOSCOPY (EGD);  Surgeon: Doran Stabler, MD;  Location: [facility];  Service: Endoscopy;  Laterality: N/A;   ESOPHAGOGASTRODUODENOSCOPY (EGD) WITH PROPOFOL N/A 09/15/2015   Procedure: ESOPHAGOGASTRODUODENOSCOPY (EGD) WITH PROPOFOL;  Surgeon: Manus Gunning, MD;  [facility];  Service: Gastroenterology;  Laterality: N/A;   ESOPHAGOGASTRODUODENOSCOPY (EGD) WITH PROPOFOL N/A 10/12/2020   Procedure: ESOPHAGOGASTRODUODENOSCOPY (EGD) WITH PROPOFOL;  Surgeon: Gatha Mayer, MD;  Location: [place];  Service: Endoscopy;  Laterality: N/A;   FOOT SURGERY     GASTROJEJUNOSTOMY     hx/notes 01/10/2015   GASTROJEJUNOSTOMY N/A 09/23/2015   Procedure: OPEN GASTROJEJUNOSTOMY TUBE PLACEMENT;  Surgeon: Arta Bruce Kinsinger, MD;  Location: [facility];  Service: General;  Laterality: N/A;   GLAUCOMA SURGERY Bilateral    HEMATOMA EVACUATION Right 10/20/2020   Procedure: EVACUATION HEMATOMA RIGHT GROIN;  Surgeon: Cherre Robins, MD;  Location: [place];  Service: Vascular;   Laterality: Right;   IR CM INJ ANY COLONIC TUBE W/FLUORO  01/05/2017   IR CM INJ ANY COLONIC TUBE W/FLUORO  06/07/2017   IR CM INJ ANY COLONIC TUBE W/FLUORO  11/05/2017   IR CM INJ ANY COLONIC TUBE W/FLUORO  09/18/2018   IR CM INJ ANY COLONIC TUBE W/FLUORO  03/06/2019   IR CM INJ ANY COLONIC TUBE W/FLUORO  03/11/2019   IR GASTR TUBE CONVERT GASTR-JEJ PER W/FL MOD SED  04/30/2020   IR GENERIC HISTORICAL  01/07/2016   IR GJ TUBE CHANGE 01/07/2016 Jacqulynn Cadet, MD [facility]   IR GENERIC HISTORICAL  01/27/2016   IR MECH REMOV OBSTRUC MAT ANY COLON TUBE W/FLUORO 01/27/2016 Markus Daft, MD [facility]   IR GENERIC HISTORICAL  02/07/2016   IR PATIENT EVAL TECH 0-60 MINS Darrell K Allred, PA-C [facility]   IR GENERIC HISTORICAL  02/08/2016   IR [place] TUBE CHANGE 02/08/2016 Greggory Keen, MD [facility]   IR GENERIC HISTORICAL  01/06/2016   IR [place] TUBE CHANGE 01/06/2016 [facility] OUT REF   IR GENERIC HISTORICAL  05/02/2016   IR CM INJ ANY COLONIC TUBE W/FLUORO 05/02/2016 Arne Cleveland, MD [facility]   IR GENERIC HISTORICAL  05/15/2016   IR [place] TUBE CHANGE 05/15/2016 Sandi Mariscal, MD [facility]   IR GENERIC HISTORICAL  06/28/2016   IR GJ TUBE CHANGE 06/28/2016 [facility]   IR GJ TUBE CHANGE  02/20/2017   IR GJ TUBE CHANGE  05/10/2017   IR GJ TUBE CHANGE  07/06/2017   IR GJ TUBE CHANGE  08/02/2017   IR GJ TUBE CHANGE  10/15/2017   IR GJ TUBE CHANGE  12/26/2017   IR GJ TUBE CHANGE  04/08/2018   IR GJ TUBE CHANGE  06/19/2018   IR GJ TUBE CHANGE  03/03/2019   IR GJ TUBE CHANGE  06/06/2019   IR GJ TUBE CHANGE  09/26/2019   IR GJ TUBE CHANGE  01/09/2020   IR GJ TUBE CHANGE  05/12/2020   IR GJ TUBE CHANGE  10/05/2020   IR GJ TUBE CHANGE  10/14/2020   IR GJ TUBE CHANGE  10/19/2020   IR GJ TUBE CHANGE  02/25/2021   IR GJ TUBE CHANGE  03/23/2021   IR PATIENT EVAL TECH 0-60 MINS  10/19/2016   IR PATIENT EVAL TECH 0-60 MINS  12/25/2016   IR PATIENT EVAL TECH 0-60 MINS  01/29/2017   IR  PATIENT EVAL TECH 0-60 MINS  04/04/2017   IR PATIENT EVAL TECH  0-60 MINS  04/30/2017   IR PATIENT EVAL TECH 0-60 MINS  07/31/2017   IR REPLC DUODEN/JEJUNO TUBE PERCUT W/FLUORO  11/14/2016   IRRIGATION AND DEBRIDEMENT FOOT Bilateral 12/26/2020   Procedure: IRRIGATION AND DEBRIDEMENT FOOT AND BONE BIOPSY;  Surgeon: Trula Slade, DPM;  Location: [place];  Service: Podiatry;  Laterality: Bilateral;   LAPAROTOMY N/A 01/20/2015   Procedure: EXPLORATORY LAPAROTOMY;  Surgeon: Coralie Keens, MD;  Location: [place];  Service: General;  Laterality: N/A;   LOWER EXTREMITY ANGIOGRAPHY Left 09/21/2016   Procedure: Lower Extremity Angiography;  Surgeon: Waynetta Sandy, MD;  Location: [place] [facility];  Service: Cardiovascular;  Laterality: Left;   LOWER EXTREMITY ANGIOGRAPHY Right 06/27/2017   Procedure: Lower Extremity Angiography;  Surgeon: Waynetta Sandy, MD;  Location: [place] [facility];  Service: Cardiovascular;  Laterality: Right;   LOWER EXTREMITY ANGIOGRAPHY Right 12/24/2020   Procedure: LOWER EXTREMITY ANGIOGRAPHY;  Surgeon: Cherre Robins, MD;  Location: [place] [facility];  Service: Cardiovascular;  Laterality: Right;   LYSIS OF ADHESION N/A 01/20/2015   Procedure: LYSIS OF ADHESIONS < 1 HOUR;  Surgeon: Coralie Keens, MD;  Location: [place];  Service: General;  Laterality: N/A;   PERIPHERAL VASCULAR BALLOON ANGIOPLASTY Left 09/21/2016   Procedure: Peripheral Vascular Balloon Angioplasty;  Surgeon: Waynetta Sandy, MD;  Location: [place] [facility];  Service: Cardiovascular;  Laterality: Left;  drug coated balloon   PERIPHERAL VASCULAR BALLOON ANGIOPLASTY Right 06/27/2017   Procedure: PERIPHERAL VASCULAR BALLOON ANGIOPLASTY;  Surgeon: Waynetta Sandy, MD;  Location: [place] [facility];  Service: Cardiovascular;  Laterality: Right;  [id number]   PERIPHERAL VASCULAR INTERVENTION Left 10/20/2020   Procedure: PERIPHERAL VASCULAR INTERVENTION;   Surgeon: Cherre Robins, MD;  Location: [place] [facility];  Service: Cardiovascular;  Laterality: Left;   TONSILLECTOMY     TRANSMETATARSAL AMPUTATION Right 11/14/2019   Procedure: TRANSMETATARSAL AMPUTATION RIGHT FOOT;  Surgeon: Edrick Kins, DPM;  Location: [place];  Service: Podiatry;  Laterality: Right;   TUBAL LIGATION     VENTRAL HERNIA REPAIR  2015   incarcerated ventral hernia ([facility] 09/2013)/notes 01/10/2015   WOUND EXPLORATION Right 10/20/2020   Procedure: PRIMARY CLOSURE COMMON FEMORAL ARTERY;  Surgeon: Cherre Robins, MD;  Location: [place];  Service: Vascular;  Laterality: Right;    PMH:  Past Medical History:  Diagnosis Date   Acute respiratory failure ([place])    Anemia    previous blood transfusions   Arthritis    "all over"   Asthma    Atrial flutter (H[facility])    Bradycardia    requiring previous d/c of BB and reduction of amiodarone   CAD (coronary artery disease)    nonobstructive per notes   Chronic diastolic CHF (congestive heart failure) ([place])    CKD (chronic kidney disease), stage III ([place])    Complication of blood transfusion    "got the wrong blood type at [facility] in ~ 2015; no adverse reaction that we are aware of"/daughter, [name] (01/27/2016)   COPD (chronic obstructive pulmonary disease) ([place])    Depression    "light case"   DVT (deep venous thrombosis) ([place]) 01/2016   a. LLE DVT 01/2016 - switched from Eliquis to Coumadin.   Dyspnea    with some activity   Gastric stenosis    a. s/p stomach tube   GERD (gastroesophageal reflux disease)    GI bleed    Headache    History of 2019 novel coronavirus disease (COVID-19)  History of blood transfusion    "several" (01/27/2016)   History of stomach ulcers    Hyperlipidemia    Hypertension    Hypothyroidism    On home oxygen therapy    "2L; 9PM til 9AM" (06/27/2017)   PAD (peripheral artery disease) ([place])    a. prior gangrene, toe amputations, intervention.   PAF (paroxysmal atrial fibrillation)  ([place])    Paraesophageal hernia    Perforated gastric ulcer ([place])    Pneumonia    "a few times" (06/27/2017)   Seasonal allergies    SIADH (syndrome of inappropriate ADH production) ([place])    [name] 01/10/2015   Small bowel obstruction ([place])    "I don't know how many" (01/11/2015)   Stroke ([place])    several- no residual   Type II diabetes mellitus ([place])    "related to prednisone use  for > 20 yr; once predinose stopped; no more DM RX" (01/27/2016)   UTI (urinary tract infection) 02/08/2016   Ventral hernia with bowel obstruction     Brief Physical Examination: There were no vitals filed for this visit. General: Elderly female in NAD HEENT: Normocephalic, atraumatic Lungs: Respirations non-labored  Michaelle Birks, MD [facility] Radiology   Pager. [place]

## 2021-05-31 NOTE — Procedures (Signed)
Vascular and Interventional Radiology Procedure Note  Patient: Shannon [name] DOB: 17-Apr-1923 Medical Record Number: [id number] Note Date/Time: 05/31/21 5:09 PM   Performing Physician: Michaelle Birks, MD Assistant(s): None  Diagnosis: Catheter malfunction.  Procedure: GASTROJEJUNOSTOMY TUBE EXCHANGE  Anesthesia: Local Anesthetic Complications: None Estimated Blood Loss:  0 mL  Findings:  Successful exchange of a 52F gastrojejunostomy tube under fluoroscopy.  Plan: Return to VIR for routine GJ exchange in 6 months, or sooner if warranted.  See detailed procedure note with images in PACS. The patient tolerated the procedure well without incident or complication and was returned to [facility] in stable condition.    Michaelle Birks, MD [facility]iology [facility] Radiology   Pager. [place]

## 2021-06-01 ENCOUNTER — Other Ambulatory Visit (HOSPITAL_COMMUNITY): Payer: Self-pay | Admitting: Interventional Radiology

## 2021-06-01 DIAGNOSIS — R633 Feeding difficulties, unspecified: Secondary | ICD-10-CM

## 2021-06-02 DIAGNOSIS — J9610 Chronic respiratory failure, unspecified whether with hypoxia or hypercapnia: Secondary | ICD-10-CM | POA: Diagnosis not present

## 2021-06-02 DIAGNOSIS — E1151 Type 2 diabetes mellitus with diabetic peripheral angiopathy without gangrene: Secondary | ICD-10-CM | POA: Diagnosis not present

## 2021-06-02 DIAGNOSIS — E1169 Type 2 diabetes mellitus with other specified complication: Secondary | ICD-10-CM | POA: Diagnosis not present

## 2021-06-02 DIAGNOSIS — M86172 Other acute osteomyelitis, left ankle and foot: Secondary | ICD-10-CM | POA: Diagnosis not present

## 2021-06-02 DIAGNOSIS — E1142 Type 2 diabetes mellitus with diabetic polyneuropathy: Secondary | ICD-10-CM | POA: Diagnosis not present

## 2021-06-02 DIAGNOSIS — E039 Hypothyroidism, unspecified: Secondary | ICD-10-CM | POA: Diagnosis not present

## 2021-06-03 ENCOUNTER — Other Ambulatory Visit: Payer: Self-pay

## 2021-06-03 DIAGNOSIS — I70245 Atherosclerosis of native arteries of left leg with ulceration of other part of foot: Secondary | ICD-10-CM

## 2021-06-08 ENCOUNTER — Ambulatory Visit (INDEPENDENT_AMBULATORY_CARE_PROVIDER_SITE_OTHER): Payer: Medicare Other | Admitting: Nurse Practitioner

## 2021-06-08 ENCOUNTER — Encounter: Payer: Self-pay | Admitting: Nurse Practitioner

## 2021-06-08 ENCOUNTER — Other Ambulatory Visit: Payer: Self-pay

## 2021-06-08 VITALS — BP 150/80 | HR 79 | Temp 97.1°F | Ht 66.0 in | Wt 121.2 lb

## 2021-06-08 DIAGNOSIS — I10 Essential (primary) hypertension: Secondary | ICD-10-CM

## 2021-06-08 DIAGNOSIS — E1142 Type 2 diabetes mellitus with diabetic polyneuropathy: Secondary | ICD-10-CM | POA: Diagnosis not present

## 2021-06-08 DIAGNOSIS — K9423 Gastrostomy malfunction: Secondary | ICD-10-CM | POA: Diagnosis not present

## 2021-06-08 DIAGNOSIS — M545 Low back pain, unspecified: Secondary | ICD-10-CM

## 2021-06-08 DIAGNOSIS — G8929 Other chronic pain: Secondary | ICD-10-CM

## 2021-06-08 DIAGNOSIS — E039 Hypothyroidism, unspecified: Secondary | ICD-10-CM

## 2021-06-08 DIAGNOSIS — I5032 Chronic diastolic (congestive) heart failure: Secondary | ICD-10-CM | POA: Diagnosis not present

## 2021-06-08 DIAGNOSIS — N184 Chronic kidney disease, stage 4 (severe): Secondary | ICD-10-CM

## 2021-06-08 DIAGNOSIS — M868X7 Other osteomyelitis, ankle and foot: Secondary | ICD-10-CM

## 2021-06-08 DIAGNOSIS — D500 Iron deficiency anemia secondary to blood loss (chronic): Secondary | ICD-10-CM | POA: Diagnosis not present

## 2021-06-08 DIAGNOSIS — I70245 Atherosclerosis of native arteries of left leg with ulceration of other part of foot: Secondary | ICD-10-CM

## 2021-06-08 NOTE — Patient Instructions (Addendum)
Decrease protonix to daily-- if symptoms worsen resume twice daily ?To take simethicone as needed gas.  ? ?Can use voltaren gel to low back ?Recommend heat pad to back then use gel/rub after heat.  ? ? ? ?

## 2021-06-08 NOTE — Progress Notes (Signed)
Careteam: Patient Care Team: [name], NP as PCP - General (Geriatric Medicine) Dorothy Spark, MD as PCP - Cardiology (Cardiology) Edrick Kins, DPM as Consulting Physician (Podiatry) Dorothy Spark, MD as Consulting Physician (Cardiology) [name], NP as Nurse Practitioner (Geriatric Medicine) Camillo Flaming, [place] as Referring Physician (Optometry) [name], RN as [place] Management  PLACE OF SERVICE:  [place] Directive information Does Patient Have a Medical Advance Directive?: Yes, Type of Advance Directive: [place], Does patient want to make changes to medical advance directive?: No - Patient declined  Allergies  Allergen Reactions   Penicillins Itching, Rash and Other (See Comments)    Tolerated amoxicillin, unasyn, zosyn & cephalosporins in the past. Did it involve swelling of the face/tongue/throat, SOB, or low BP? No Did it involve sudden or severe rash/hives, skin peeling, or any reaction on the inside of your mouth or nose? No Did you need to seek medical attention at a hospital or doctor's office? Unknown When did it last happen?      5+ years If all above answers are "NO", may proceed with cephalosporin use.     Chief Complaint  Patient presents with   Medical Management of Chronic Issues    3 month follow-up. Foot exam today. Discuss need for shingrix, additional coivd boosters, and eye exam or post pone/exclude if patient refuses. Patient denies receiving any vaccines since last visit. [facility] verified Patient states tube site on stomach is leaking and sometimes has blood in it. Patient states she is having pain in chest. Patient states pain seems to be constant. Has been having pain since about the end of Feb.      HPI: Patient is a 88 y.o. female for follow up   Pt has concerns of mid-sternal chest pain since the 05/31/21 after peg tube got replaced. Tried taking tylenol,  and claritin. Pain not getting better or worse just present at all times. Has a lot of gas, will take Tums which helps symptoms.   Peg tube is still bleeding around site at times. Replaced in February. Dark content is escaping around site.   Lower back pain-tried taking tylenol. Still bothering patient. Using tramadol rarely   Osteomyelitis - on doxycycline, left toe healed but right not healed. Still on doxy until foot is healed.   Type 2 DM with neuropathy- not on medication for diabetes; glucose runs in the low 100's -110's twice daily.   HTN- SBP 140's at home. Amlodipine 5 mg. No issues   Anemia- takes ferrous sulfate and nutritional supplement  CHF- on Lasix 40 mg , no issues.   Review of Systems:  Review of Systems  Constitutional:  Negative for chills, fever and malaise/fatigue.  Respiratory:  Negative for cough, shortness of breath and wheezing.   Cardiovascular:  Negative for chest pain, palpitations and leg swelling.  Gastrointestinal:  Positive for blood in stool. Negative for abdominal pain, constipation, diarrhea, heartburn, nausea and vomiting.       -Liquid BM and dark before tube replacement.   -After peg tube replaced a small amount of blood appear around site.  Blood on dressing has increased.   -BM yesterday had more bright red blood present.   Genitourinary:  Negative for dysuria, frequency and urgency.   Past Medical History:  Diagnosis Date   Acute respiratory failure ([place])    Anemia    previous blood transfusions   Arthritis    "  all over"   Asthma    Atrial flutter (HCC)    Bradycardia    requiring previous d/c of BB and reduction of amiodarone   CAD (coronary artery disease)    nonobstructive per notes   Chronic diastolic CHF (congestive heart failure) ([place])    CKD (chronic kidney disease), stage III ([place])    Complication of blood transfusion    "got the wrong blood type at [facility] in ~ 2015; no adverse reaction that we are aware of"/daughter,  [name] (01/27/2016)   COPD (chronic obstructive pulmonary disease) ([place])    Depression    "light case"   DVT (deep venous thrombosis) ([place]) 01/2016   a. LLE DVT 01/2016 - switched from Eliquis to Coumadin.   Dyspnea    with some activity   Gastric stenosis    a. s/p stomach tube   GERD (gastroesophageal reflux disease)    GI bleed    Headache    History of 2019 novel coronavirus disease (COVID-19)    History of blood transfusion    "several" (01/27/2016)   History of stomach ulcers    Hyperlipidemia    Hypertension    Hypothyroidism    On home oxygen therapy    "2L; 9PM til 9AM" (06/27/2017)   PAD (peripheral artery disease) (HCC)    a. prior gangrene, toe amputations, intervention.   PAF (paroxysmal atrial fibrillation) ([place])    Paraesophageal hernia    Perforated gastric ulcer ([place])    Pneumonia    "a few times" (06/27/2017)   Seasonal allergies    SIADH (syndrome of inappropriate ADH production) ([place])    [name] 01/10/2015   Small bowel obstruction ([place])    "I don't know how many" (01/11/2015)   Stroke ([place])    several- no residual   Type II diabetes mellitus ([place])    "related to prednisone use  for > 20 yr; once predinose stopped; no more DM RX" (01/27/2016)   UTI (urinary tract infection) 02/08/2016   Ventral hernia with bowel obstruction    Past Surgical History:  Procedure Laterality Date   ABDOMINAL AORTOGRAM N/A 09/21/2016   Procedure: Abdominal Aortogram;  Surgeon: Waynetta Sandy, MD;  Location: [facility];  Service: Cardiovascular;  Laterality: N/A;   ABDOMINAL AORTOGRAM W/LOWER EXTREMITY N/A 10/20/2020   Procedure: ABDOMINAL AORTOGRAM W/LOWER EXTREMITY;  Surgeon: Cherre Robins, MD;  Location: [facility];  Service: Cardiovascular;  Laterality: N/A;   AMPUTATION Left 09/25/2016   Procedure: AMPUTATION DIGIT- LEFT 2ND AND 3RD TOES;  Surgeon: Rosetta Posner, MD;  Location: [facility];  Service: Vascular;  Laterality: Left;   AMPUTATION  Right 05/03/2018   Procedure: AMPUTATION RIGHT GREAT TOE;  Surgeon: Rosetta Posner, MD;  [facility];  Service: Vascular;  Laterality: Right;   CATARACT EXTRACTION W/ INTRAOCULAR LENS  IMPLANT, BILATERAL     CHOLECYSTECTOMY OPEN     COLECTOMY     ESOPHAGOGASTRODUODENOSCOPY N/A 01/19/2014   Procedure: ESOPHAGOGASTRODUODENOSCOPY (EGD);  Surgeon: Irene Shipper, MD;  [facility]: [facility];  Service: Endoscopy;  Laterality: N/A;   ESOPHAGOGASTRODUODENOSCOPY N/A 01/20/2014   Procedure: ESOPHAGOGASTRODUODENOSCOPY (EGD);  Surgeon: Irene Shipper, MD;  Location: [facility];  Service: Endoscopy;  Laterality: N/A;   ESOPHAGOGASTRODUODENOSCOPY N/A 03/19/2014   Procedure: ESOPHAGOGASTRODUODENOSCOPY (EGD);  Surgeon: Milus Banister, MD;  Location: [facility];  Service: Endoscopy;  Laterality: N/A;   ESOPHAGOGASTRODUODENOSCOPY N/A 07/08/2015   Procedure: ESOPHAGOGASTRODUODENOSCOPY (EGD);  Surgeon: Doran Stabler, MD;  [facility];  Service: Endoscopy;  Laterality: N/A;   ESOPHAGOGASTRODUODENOSCOPY (EGD) WITH PROPOFOL N/A 09/15/2015   Procedure: ESOPHAGOGASTRODUODENOSCOPY (EGD) WITH PROPOFOL;  Surgeon: Manus Gunning, MD;  [facility];  Service: Gastroenterology;  Laterality: N/A;   ESOPHAGOGASTRODUODENOSCOPY (EGD) WITH PROPOFOL N/A 10/12/2020   Procedure: ESOPHAGOGASTRODUODENOSCOPY (EGD) WITH PROPOFOL;  Surgeon: Gatha Mayer, MD;  Location: [facility];  Service: Endoscopy;  Laterality: N/A;   FOOT SURGERY     GASTROJEJUNOSTOMY     hx/notes 01/10/2015   GASTROJEJUNOSTOMY N/A 09/23/2015   Procedure: OPEN GASTROJEJUNOSTOMY TUBE PLACEMENT;  Surgeon: Arta Bruce Kinsinger, MD;  [facility];  Service: General;  Laterality: N/A;   GLAUCOMA SURGERY Bilateral    HEMATOMA EVACUATION Right 10/20/2020   Procedure: EVACUATION HEMATOMA RIGHT GROIN;  Surgeon: Cherre Robins, MD;  Location: [facility];  Service: Vascular;  Laterality: Right;   IR CM INJ ANY COLONIC TUBE W/FLUORO   01/05/2017   IR CM INJ ANY COLONIC TUBE W/FLUORO  06/07/2017   IR CM INJ ANY COLONIC TUBE W/FLUORO  11/05/2017   IR CM INJ ANY COLONIC TUBE W/FLUORO  09/18/2018   IR CM INJ ANY COLONIC TUBE W/FLUORO  03/06/2019   IR CM INJ ANY COLONIC TUBE W/FLUORO  03/11/2019   IR GASTR TUBE CONVERT GASTR-JEJ PER W/FL MOD SED  04/30/2020   IR GENERIC HISTORICAL  01/07/2016   IR GJ TUBE CHANGE 01/07/2016 Jacqulynn Cadet, MD [facility]   IR GENERIC HISTORICAL  01/27/2016   IR MECH REMOV OBSTRUC MAT ANY COLON TUBE W/FLUORO 01/27/2016 Markus Daft, MD [facility]   IR GENERIC HISTORICAL  02/07/2016   IR PATIENT EVAL TECH 0-60 MINS Darrell K Allred, PA-C [facility]   IR GENERIC HISTORICAL  02/08/2016   IR [place] TUBE CHANGE 02/08/2016 Greggory Keen, MD [facility]   IR GENERIC HISTORICAL  01/06/2016   IR [place] TUBE CHANGE 01/06/2016 [facility] OUT REF   IR GENERIC HISTORICAL  05/02/2016   IR CM INJ ANY COLONIC TUBE W/FLUORO 05/02/2016 Arne [place], MD [facility]   IR GENERIC HISTORICAL  05/15/2016   IR GJ TUBE CHANGE 05/15/2016 Sandi Mariscal, MD [facility]   IR GENERIC HISTORICAL  06/28/2016   IR GJ TUBE CHANGE 06/28/2016 [facility]   IR GJ TUBE CHANGE  02/20/2017   IR GJ TUBE CHANGE  05/10/2017   IR GJ TUBE CHANGE  07/06/2017   IR GJ TUBE CHANGE  08/02/2017   IR GJ TUBE CHANGE  10/15/2017   IR [place] TUBE CHANGE  12/26/2017   IR GJ TUBE CHANGE  04/08/2018   IR GJ TUBE CHANGE  06/19/2018   IR GJ TUBE CHANGE  03/03/2019   IR GJ TUBE CHANGE  06/06/2019   IR GJ TUBE CHANGE  09/26/2019   IR GJ TUBE CHANGE  01/09/2020   IR GJ TUBE CHANGE  05/12/2020   IR GJ TUBE CHANGE  10/05/2020   IR GJ TUBE CHANGE  10/14/2020   IR GJ TUBE CHANGE  10/19/2020   IR GJ TUBE CHANGE  02/25/2021   IR GJ TUBE CHANGE  03/23/2021   IR PATIENT EVAL TECH 0-60 MINS  10/19/2016   IR PATIENT EVAL TECH 0-60 MINS  12/25/2016   IR PATIENT EVAL TECH 0-60 MINS  01/29/2017   IR PATIENT EVAL TECH 0-60 MINS  04/04/2017   IR PATIENT EVAL  TECH 0-60 MINS  04/30/2017   IR PATIENT EVAL TECH 0-60 MINS  07/31/2017   IR REPLC DUODEN/JEJUNO TUBE PERCUT W/FLUORO  11/14/2016  IR REPLC GASTRO/COLONIC TUBE PERCUT W/FLUORO  05/31/2021   IRRIGATION AND DEBRIDEMENT FOOT Bilateral 12/26/2020   Procedure: IRRIGATION AND DEBRIDEMENT FOOT AND BONE BIOPSY;  Surgeon: Trula Slade, DPM;  Location: [facility];  Service: Podiatry;  Laterality: Bilateral;   LAPAROTOMY N/A 01/20/2015   Procedure: EXPLORATORY LAPAROTOMY;  Surgeon: Coralie Keens, MD;  Location: [facility];  Service: General;  Laterality: N/A;   LOWER EXTREMITY ANGIOGRAPHY Left 09/21/2016   Procedure: Lower Extremity Angiography;  Surgeon: Waynetta Sandy, MD;  Location: [facility];  Service: Cardiovascular;  Laterality: Left;   LOWER EXTREMITY ANGIOGRAPHY Right 06/27/2017   Procedure: Lower Extremity Angiography;  Surgeon: Waynetta Sandy, MD;  Location: [facility];  Service: Cardiovascular;  Laterality: Right;   LOWER EXTREMITY ANGIOGRAPHY Right 12/24/2020   Procedure: LOWER EXTREMITY ANGIOGRAPHY;  Surgeon: Cherre Robins, MD;  Location: [facility];  Service: Cardiovascular;  Laterality: Right;   LYSIS OF ADHESION N/A 01/20/2015   Procedure: LYSIS OF ADHESIONS < 1 HOUR;  Surgeon: Coralie Keens, MD;  Location: [facility];  Service: General;  Laterality: N/A;   PERIPHERAL VASCULAR BALLOON ANGIOPLASTY Left 09/21/2016   Procedure: Peripheral Vascular Balloon Angioplasty;  Surgeon: Waynetta Sandy, MD;  Location: [facility];  Service: Cardiovascular;  Laterality: Left;  drug coated balloon   PERIPHERAL VASCULAR BALLOON ANGIOPLASTY Right 06/27/2017   Procedure: PERIPHERAL VASCULAR BALLOON ANGIOPLASTY;  Surgeon: Waynetta Sandy, MD;  Location: [facility];  Service: Cardiovascular;  Laterality: Right;  SFA/[id number]   PERIPHERAL VASCULAR INTERVENTION Left 10/20/2020   Procedure: PERIPHERAL VASCULAR INTERVENTION;   Surgeon: Cherre Robins, MD;  Location: [facility];  Service: Cardiovascular;  Laterality: Left;   TONSILLECTOMY     TRANSMETATARSAL AMPUTATION Right 11/14/2019   Procedure: TRANSMETATARSAL AMPUTATION RIGHT FOOT;  Surgeon: Edrick Kins, DPM;  Location: [facility];  Service: Podiatry;  Laterality: Right;   TUBAL LIGATION     VENTRAL HERNIA REPAIR  2015   incarcerated ventral hernia ([facility] 09/2013)/notes 01/10/2015   WOUND EXPLORATION Right 10/20/2020   Procedure: PRIMARY CLOSURE COMMON FEMORAL ARTERY;  Surgeon: Cherre Robins, MD;  Location: [facility];  Service: Vascular;  Laterality: Right;   Social History:   reports that she has never smoked. She quit smokeless tobacco use about 41 years ago.  Her smokeless tobacco use included snuff. She reports that she does not drink alcohol and does not use drugs.  Family History  Problem Relation Age of Onset   Stroke Mother    Hypertension Mother    Diabetes Brother    Glaucoma Son    Glaucoma Son    Heart attack Neg Hx    Colon cancer Neg Hx    Stomach cancer Neg Hx     Medications: Patient's Medications  New Prescriptions   No medications on file  Previous Medications   ACETAMINOPHEN (TYLENOL) 500 MG TABLET    Take 500 mg by mouth every 6 (six) hours as needed for moderate pain.   AMLODIPINE (NORVASC) 5 MG TABLET    TAKE 1 TABLET BY MOUTH EVERY DAY   ASPIRIN 81 MG EC TABLET    Take 1 tablet (81 mg total) by mouth daily. Swallow whole.   ATORVASTATIN (LIPITOR) 20 MG TABLET    Take 0.5 tablets (10 mg total) by mouth daily.   B COMPLEX VITAMINS TABLET    Take 1 tablet by mouth daily.    CHOLECALCIFEROL (VITAMIN D3) 25 MCG (1000 UNIT)  TABLET    Take 1,000 Units by mouth daily.   CYCLOSPORINE (RESTASIS) 0.05 % OPHTHALMIC EMULSION    USE 1 DROP INTO BOTH EYES TWICE DAILY   DOXYCYCLINE (VIBRA-TABS) 100 MG TABLET    Take 1 tablet (100 mg total) by mouth daily.   DULOXETINE (CYMBALTA) 30 MG CAPSULE    TAKE 1 CAPSULE BY MOUTH EVERY DAY    FERROUS SULFATE 220 (44 FE) MG/5ML SOLUTION    TAKE 7.4 MLS (325 MG TOTAL) BY MOUTH DAILY.   FLUTICASONE (FLONASE) 50 MCG/ACT NASAL SPRAY    2 sprays into each nostril daily.   FUROSEMIDE (LASIX) 40 MG TABLET    TAKE 0.5-1 TABLETS (20-40 MG TOTAL) BY MOUTH SEE ADMIN INSTRUCTIONS. TAKE 20 MG DAILY, MAY INSTEAD TAKE 40 MG DAILY AS NEEDED FOR EDEMA   GABAPENTIN (NEURONTIN) 300 MG CAPSULE    TAKE 1 CAPSULE BY MOUTH TWICE A DAY   GENTAMICIN CREAM (GARAMYCIN) 0.1 %    Apply 1 application topically daily as needed (dry skin).   HYDRALAZINE (APRESOLINE) 50 MG TABLET    Take 1 tablet (50 mg total) by mouth 3 (three) times daily.   INSULIN PEN NEEDLE (B-D UF III MINI PEN NEEDLES) 31G X 5 MM MISC    Use twice daily with giving insulin injections. Dx E11.9   LATANOPROST (XALATAN) 0.005 % OPHTHALMIC SOLUTION    Place 1 drop into both eyes at bedtime.   LEVOTHYROXINE (SYNTHROID) 125 MCG TABLET    TAKE 1 TABLET BY MOUTH EVERY DAY   LIVER OIL-ZINC OXIDE (DESITIN) 40 % OINTMENT    Apply topically 2 (two) times daily.   LORATADINE (CLARITIN) 10 MG TABLET    Take 10 mg by mouth daily.   MOMETASONE-FORMOTEROL (DULERA) 200-5 MCG/ACT AERO    INHALE TWO PUFFS BY MOUTH TWICE DAILY   MULTIPLE VITAMIN (MULTIVITAMIN WITH MINERALS) TABS TABLET    Take 1 tablet by mouth daily.   NUTRITIONAL SUPPLEMENTS (FEEDING SUPPLEMENT, OSMOLITE 1.5 CAL,) LIQD    Place 711 mLs into feeding tube See admin instructions. Take 711 ml over a 12 hour period per day 9pm-9am at a rate of 65   NYSTATIN CREAM (MYCOSTATIN)    APPLY TO AFFECTED AREA TWICE A DAY   OXYCODONE (ROXICODONE) 5 MG IMMEDIATE RELEASE TABLET    Take 0.5-1 tablets (2.5-5 mg total) by mouth every 8 (eight) hours as needed for severe pain.   PANTOPRAZOLE (PROTONIX) 40 MG TABLET    Take 1 tablet (40 mg total) by mouth 2 (two) times daily.   POLYETHYL GLYCOL-PROPYL GLYCOL (SYSTANE) 0.4-0.3 % GEL OPHTHALMIC GEL    Place 1 application into both eyes 2 (two) times daily as needed  (moisture).   PROBIOTIC PRODUCT (EQL PROBIOTIC COLON SUPPORT PO)    Take 1 capsule by mouth daily.   SENNA-DOCUSATE (SENOKOT-S) 8.6-50 MG TABLET    Take 1 tablet by mouth at bedtime.   SODIUM CHLORIDE-SODIUM BICARB (NETI POT SINUS WASH NA)    Place 1 application into the nose at bedtime.   TRAMADOL (ULTRAM) 50 MG TABLET    Place 1 tablet (50 mg total) into feeding tube every 6 (six) hours as needed for severe pain.   WATER FOR IRRIGATION, STERILE (FREE WATER) SOLN    Place 180 mLs into feeding tube 3 (three) times daily.   WHITE PETROLATUM-MINERAL OIL (SYSTANE NIGHTTIME) OINT    Place 1 application into both eyes at bedtime.   Modified Medications   No medications on file  Discontinued  Medications   SUCRALFATE (CARAFATE) 1 GM/10ML SUSPENSION    Take 10 mLs (1 g total) by mouth every 6 (six) hours.    Physical Exam:  Vitals:   06/08/21 1326  BP: (!) 150/80  Pulse: 79  Temp: (!) 97.1 F (36.2 C)  TempSrc: Temporal  SpO2: 99%  Weight: 121 lb 3.2 oz (55 kg)  Height: '5\' 6"'$  (1.676 m)   Body mass index is 19.56 kg/m. Wt Readings from Last 3 Encounters:  06/08/21 121 lb 3.2 oz (55 kg)  05/04/21 121 lb (54.9 kg)  03/09/21 123 lb 12.8 oz (56.2 kg)    Physical Exam Constitutional:      Appearance: Normal appearance. She is normal weight.  HENT:     Right Ear: Tympanic membrane normal.     Left Ear: Tympanic membrane normal.     Mouth/Throat:     Mouth: Mucous membranes are moist.     Pharynx: Oropharynx is clear. No oropharyngeal exudate.  Cardiovascular:     Rate and Rhythm: Normal rate and regular rhythm.     Pulses: Normal pulses.     Heart sounds: Normal heart sounds.  Pulmonary:     Effort: Pulmonary effort is normal.     Breath sounds: Normal breath sounds.  Abdominal:     General: Bowel sounds are normal.     Hernia: A hernia is present.     Comments: G tube- has bile oozing around site   Musculoskeletal:        General: Normal range of motion.     Comments: Only  has both great toe, other toes are amputated.   Neurological:     Mental Status: She is alert and oriented to person, place, and time.     Gait: Gait abnormal.  Psychiatric:        Mood and Affect: Mood normal.        Behavior: Behavior normal.        Thought Content: Thought content normal.        Judgment: Judgment normal.   Labs reviewed: Basic Metabolic Panel: Recent Labs    11/04/20 1114 12/20/20 1443 12/27/20 0103 12/28/20 0417 12/28/20 1539 12/29/20 0203 12/30/20 0233 02/16/21 0115 03/07/21 1120 05/04/21 1502  NA 135   < > 129* 130*   < > 131*   < > 139 139 141  K 4.9   < > 4.3 4.4   < > 4.3   < > 4.2 4.4 5.2  CL 101   < > 100 103   < > 103   < > 110 101 103  CO2 28   < > 21* 22   < > 23   < > 20* 29 24  GLUCOSE 74   < > 191* 142*   < > 107*   < > 163* 46* 99  BUN 61*   < > 39* 49*   < > 42*   < > 52* 68* 69*  CREATININE 1.61*   < > 1.21* 1.15*   < > 1.11*   < > 1.31* 1.72* 1.48*  CALCIUM 9.5   < > 8.6* 8.9   < > 9.0   < > 8.9 9.4 9.5  MG  --    < > 1.7 1.8  --  2.0  --   --   --   --   PHOS  --    < > 3.5 3.0  --  2.7  --   --   --   --  TSH 0.41  --   --   --   --   --   --   --  3.06  --    < > = values in this interval not displayed.   Liver Function Tests: Recent Labs    12/28/20 1539 12/29/20 0203 02/14/21 1447 03/07/21 1120  AST 47* 38 22 24  ALT 42 36 17 14  ALKPHOS 82 80 76  --   BILITOT 0.6 0.4 0.3 0.5  PROT 6.1* 5.8* 7.0 7.3  ALBUMIN 2.5* 2.4* 3.0*  --    No results for input(s): LIPASE, AMYLASE in the last 8760 hours. No results for input(s): AMMONIA in the last 8760 hours. CBC: Recent Labs    12/29/20 0203 12/30/20 0233 01/06/21 1225 02/14/21 1447 02/15/21 0345 02/15/21 1329 02/16/21 0115 03/07/21 1120  WBC 4.5   < > 5.4 9.2  --   --  7.5 6.2  NEUTROABS 2.3  --  3,580  --   --   --   --  4,142  HGB 8.6*   < > 9.0* 5.3*   < > 9.3* 9.4* 9.3*  HCT 25.2*   < > 27.3* 17.2*   < > 27.4* 28.7* 28.4*  MCV 94.7   < > 98.2 105.5*  --   --   94.4 97.6  PLT 187   < > 271 300  --   --  248 276   < > = values in this interval not displayed.   Lipid Panel: Recent Labs    11/04/20 1114 03/07/21 1120  CHOL 111 106  HDL 55 51  LDLCALC 39 35  TRIG 91 113  CHOLHDL 2.0 2.1   TSH: Recent Labs    11/04/20 1114 03/07/21 1120  TSH 0.41 3.06   A1C: Lab Results  Component Value Date   HGBA1C 5.4 03/07/2021     Assessment/Plan  1. Type 2 diabetes mellitus with peripheral neuropathy (HCC) - Neuropathy related to type 2 DTM:- Controlled with Cymbalta 30 mg and Gabapentin 300 mg  -A1C ordered  -DTM is controlled and will follow up A1c since she has been off medication.   2. Essential hypertension, benign - Controlled with Amlopidine 5 mg and Hydralazine '50mg'$   -continue medication as prescribed.  3. Acquired hypothyroidism -Managed with Levothyroxine 125 mcg QD -TSH 3.06 on 03/23/21 -Stable condition   4. Chronic diastolic CHF (congestive heart failure) (HCC) - euvolemic, Takes Lasix 40 mg  -Stable condition  5. Anemia due to blood loss -Hgb is 9.3 on 03/07/21 -Takes ferrous sulfate supplements  - CBC ordered   6. Other osteomyelitis of right foot ([place]) - Debridement of wound and silver alginate dressing placed, followed by podiatry and vascular.  - Amputated toes on both feet  -On doxycycline until foot heals.   7. Chronic kidney disease, stage 4, severely decreased GFR (HCC) Chronic and stable Encourage proper hydration Follow metabolic panel Avoid nephrotoxic meds (NSAIDS)  8. PEG tube malfunction (HCC) -dark content oozing from site  -Advised to continue changing dress twice daily, and ensure it is positioned directly against the skin. -Because peg tube drainage is noted most after oral liquid intake, space consumption of oral liquid intake in between osmolite feeding and not during feeding. Some bleeding after insertion, will follow up CBC  9.  Lower back pain -Recommend using heat pad to back and  voltaren gel to skin after heat pad.   Next appt: Return in about 3 months (around 09/08/2021) for routine follow  up. .  I personally was present during the history, physical exam and medical decision-making activities of this service and have verified that the service and findings are accurately documented in the students note  Janett Billow K. [place], [facility] [phone]

## 2021-06-09 LAB — CBC WITH DIFFERENTIAL/PLATELET
Absolute Monocytes: 697 cells/uL (ref 200–950)
Basophils Absolute: 49 cells/uL (ref 0–200)
Basophils Relative: 0.9 %
Eosinophils Absolute: 238 cells/uL (ref 15–500)
Eosinophils Relative: 4.4 %
HCT: 28.9 % — ABNORMAL LOW (ref 35.0–45.0)
Hemoglobin: 9.3 g/dL — ABNORMAL LOW (ref 11.7–15.5)
Lymphs Abs: 918 cells/uL (ref 850–3900)
MCH: 31 pg (ref 27.0–33.0)
MCHC: 32.2 g/dL (ref 32.0–36.0)
MCV: 96.3 fL (ref 80.0–100.0)
MPV: 12.5 fL (ref 7.5–12.5)
Monocytes Relative: 12.9 %
Neutro Abs: 3499 cells/uL (ref 1500–7800)
Neutrophils Relative %: 64.8 %
Platelets: 235 10*3/uL (ref 140–400)
RBC: 3 10*6/uL — ABNORMAL LOW (ref 3.80–5.10)
RDW: 14.5 % (ref 11.0–15.0)
Total Lymphocyte: 17 %
WBC: 5.4 10*3/uL (ref 3.8–10.8)

## 2021-06-09 LAB — HEMOGLOBIN A1C
Hgb A1c MFr Bld: 6.3 % of total Hgb — ABNORMAL HIGH (ref <5.7)
Mean Plasma Glucose: 134 mg/dL
eAG (mmol/L): 7.4 mmol/L

## 2021-06-10 ENCOUNTER — Ambulatory Visit: Payer: Medicare Other | Admitting: Nurse Practitioner

## 2021-06-13 DIAGNOSIS — I5033 Acute on chronic diastolic (congestive) heart failure: Secondary | ICD-10-CM | POA: Diagnosis not present

## 2021-06-13 DIAGNOSIS — M150 Primary generalized (osteo)arthritis: Secondary | ICD-10-CM | POA: Diagnosis not present

## 2021-06-13 DIAGNOSIS — E1169 Type 2 diabetes mellitus with other specified complication: Secondary | ICD-10-CM | POA: Diagnosis not present

## 2021-06-13 DIAGNOSIS — K5900 Constipation, unspecified: Secondary | ICD-10-CM | POA: Diagnosis not present

## 2021-06-13 DIAGNOSIS — M86172 Other acute osteomyelitis, left ankle and foot: Secondary | ICD-10-CM | POA: Diagnosis not present

## 2021-06-13 DIAGNOSIS — I4819 Other persistent atrial fibrillation: Secondary | ICD-10-CM | POA: Diagnosis not present

## 2021-06-13 DIAGNOSIS — I2510 Atherosclerotic heart disease of native coronary artery without angina pectoris: Secondary | ICD-10-CM | POA: Diagnosis not present

## 2021-06-13 DIAGNOSIS — N184 Chronic kidney disease, stage 4 (severe): Secondary | ICD-10-CM | POA: Diagnosis not present

## 2021-06-13 DIAGNOSIS — I4892 Unspecified atrial flutter: Secondary | ICD-10-CM | POA: Diagnosis not present

## 2021-06-13 DIAGNOSIS — E1122 Type 2 diabetes mellitus with diabetic chronic kidney disease: Secondary | ICD-10-CM | POA: Diagnosis not present

## 2021-06-13 DIAGNOSIS — E039 Hypothyroidism, unspecified: Secondary | ICD-10-CM | POA: Diagnosis not present

## 2021-06-13 DIAGNOSIS — K311 Adult hypertrophic pyloric stenosis: Secondary | ICD-10-CM | POA: Diagnosis not present

## 2021-06-13 DIAGNOSIS — G8929 Other chronic pain: Secondary | ICD-10-CM | POA: Diagnosis not present

## 2021-06-13 DIAGNOSIS — D631 Anemia in chronic kidney disease: Secondary | ICD-10-CM | POA: Diagnosis not present

## 2021-06-13 DIAGNOSIS — E1142 Type 2 diabetes mellitus with diabetic polyneuropathy: Secondary | ICD-10-CM | POA: Diagnosis not present

## 2021-06-13 DIAGNOSIS — F325 Major depressive disorder, single episode, in full remission: Secondary | ICD-10-CM | POA: Diagnosis not present

## 2021-06-13 DIAGNOSIS — I130 Hypertensive heart and chronic kidney disease with heart failure and stage 1 through stage 4 chronic kidney disease, or unspecified chronic kidney disease: Secondary | ICD-10-CM | POA: Diagnosis not present

## 2021-06-13 DIAGNOSIS — K439 Ventral hernia without obstruction or gangrene: Secondary | ICD-10-CM | POA: Diagnosis not present

## 2021-06-13 DIAGNOSIS — Z79891 Long term (current) use of opiate analgesic: Secondary | ICD-10-CM | POA: Diagnosis not present

## 2021-06-13 DIAGNOSIS — J449 Chronic obstructive pulmonary disease, unspecified: Secondary | ICD-10-CM | POA: Diagnosis not present

## 2021-06-13 DIAGNOSIS — J9610 Chronic respiratory failure, unspecified whether with hypoxia or hypercapnia: Secondary | ICD-10-CM | POA: Diagnosis not present

## 2021-06-13 DIAGNOSIS — K285 Chronic or unspecified gastrojejunal ulcer with perforation: Secondary | ICD-10-CM | POA: Diagnosis not present

## 2021-06-13 DIAGNOSIS — K219 Gastro-esophageal reflux disease without esophagitis: Secondary | ICD-10-CM | POA: Diagnosis not present

## 2021-06-13 DIAGNOSIS — E785 Hyperlipidemia, unspecified: Secondary | ICD-10-CM | POA: Diagnosis not present

## 2021-06-13 DIAGNOSIS — E1151 Type 2 diabetes mellitus with diabetic peripheral angiopathy without gangrene: Secondary | ICD-10-CM | POA: Diagnosis not present

## 2021-06-15 ENCOUNTER — Ambulatory Visit (INDEPENDENT_AMBULATORY_CARE_PROVIDER_SITE_OTHER): Payer: Medicare Other | Admitting: Podiatry

## 2021-06-15 ENCOUNTER — Other Ambulatory Visit: Payer: Self-pay

## 2021-06-15 DIAGNOSIS — L97512 Non-pressure chronic ulcer of other part of right foot with fat layer exposed: Secondary | ICD-10-CM | POA: Diagnosis not present

## 2021-06-15 DIAGNOSIS — E0859 Diabetes mellitus due to underlying condition with other circulatory complications: Secondary | ICD-10-CM

## 2021-06-15 NOTE — Progress Notes (Signed)
? ?Subjective:  ?Patient presenting today for follow-up evaluation of chronic wounds to bilateral feet.  Patient states that she is doing well.  They have been changing the dressings 2 times per week and they feel that the wounds are stable.  No new complaints at this time ? ?Past Medical History:  ?Diagnosis Date  ? Acute respiratory failure ([place])   ? Anemia   ? previous blood transfusions  ? Arthritis   ? "all over"  ? Asthma   ? Atrial flutter ([place])   ? Bradycardia   ? requiring previous d/c of BB and reduction of amiodarone  ? CAD (coronary artery disease)   ? nonobstructive per notes  ? Chronic diastolic CHF (congestive heart failure) ([place])   ? CKD (chronic kidney disease), stage III ([place])   ? Complication of blood transfusion   ? "got the wrong blood type at [facility] in ~ 2015; no adverse reaction that we are aware of"/daughter, [name] (01/27/2016)  ? COPD (chronic obstructive pulmonary disease) ([place])   ? Depression   ? "light case"  ? DVT (deep venous thrombosis) ([place]) 01/2016  ? a. LLE DVT 01/2016 - switched from Eliquis to Coumadin.  ? Dyspnea   ? with some activity  ? Gastric stenosis   ? a. s/p stomach tube  ? GERD (gastroesophageal reflux disease)   ? GI bleed   ? Headache   ? History of 2019 novel coronavirus disease (COVID-19)   ? History of blood transfusion   ? "several" (01/27/2016)  ? History of stomach ulcers   ? Hyperlipidemia   ? Hypertension   ? Hypothyroidism   ? On home oxygen therapy   ? "2L; 9PM til 9AM" (06/27/2017)  ? PAD (peripheral artery disease) ([place])   ? a. prior gangrene, toe amputations, intervention.  ? PAF (paroxysmal atrial fibrillation) ([place])   ? Paraesophageal hernia   ? Perforated gastric ulcer ([place])   ? Pneumonia   ? "a few times" (06/27/2017)  ? Seasonal allergies   ? SIADH (syndrome of inappropriate ADH production) ([place])   ? [name] 01/10/2015  ? Small bowel obstruction ([place])   ? "I don't know how many" (01/11/2015)  ? Stroke [facility])   ? several- no residual  ? Type II  diabetes mellitus ([place])   ? "related to prednisone use  for > 20 yr; once predinose stopped; no more DM RX" (01/27/2016)  ? UTI (urinary tract infection) 02/08/2016  ? Ventral hernia with bowel obstruction   ? ?Objective: ?Physical Exam ?General: The patient is alert and oriented x3 in no acute distress. ? ?Dermatology: There is overall improvement noted to the ulcers of the right plantar forefoot amputation stump.  Please see attached photo.  No malodor.  Moderate serosanguineous drainage noted.  Overall the wounds appear very stable for the moment. ? ?Vascular: Established PAD.  Monitored closely by vein and vascular.  Balloon angioplasty with vein and vascular.  Lower extremity angiography 12/24/2020 ? ?VAS [place] ABI W/WO TBI 09/10/2020 ?Right: Resting right ankle-brachial index is within normal range using the  ?posterior tibial artery . No evidence of significant right lower extremity  ?arterial disease.  ? ?Left: Resting left ankle-brachial index indicates noncompressible left  ?lower extremity arteries. Unable to obtain left great toe index due to  ?bandage.  ? ?Neurological: Epicritic and protective threshold diminished bilaterally.  ? ?Musculoskeletal Exam: Status post TMA right foot.  Hallux valgus left with history of overlying ulcer and degenerative changes possible exposed bone and joint  capsule.  Second and third toe amputation left. ? ?Assessment: ?1. s/p transmetatarsal amputation right foot. DOS: 11/14/2019 ?2.  H/o 2nd toe amputation left ?3.  Chronic ulcers bilateral feet ?4.  Chronic osteomyelitis bilateral feet ? ?Plan of Care:  ?1. Patient was evaluated.   ?2.  Continue home nurse dressing changes 2 times weekly.  They have been applying Betadine and Aquacel to the wounds with dry dressings ?3.  We are going to continue to pursue conservative palliative care for the patient.  Patient does not want any additional surgery to the feet.  Both the patient and daughter understand current situation of the  chronic osteomyelitis and associated risks ?5.  Medically necessary excisional debridement of the wounds was performed today using a tissue nipper.  Excisional debridement of all necrotic nonviable tissue down to healthier bleeding viable tissue was performed with postdebridement measurement same as pre- ?6.  Silvadene cream provided to apply to the first MTP of the left foot.  Betadine ointment also provided to apply to the right foot wounds.   ?7.  Return to clinic monthly for conservative routine management ?Edrick Kins, DPM ?[place] ? ?Dr. Edrick Kins, DPM  ?  ?[address]                                        ?[place], [place] [address]                ?Office [phone]  ?Fax [phone] ? ? ? ? ? ?

## 2021-06-16 ENCOUNTER — Other Ambulatory Visit: Payer: Self-pay | Admitting: *Deleted

## 2021-06-16 NOTE — Patient Outreach (Signed)
[facility]) Care Management ? ?06/16/2021 ? ?ADDISEN CHAPPELLE ?[birth year] ?[id number] ? ? ?Outgoing call placed to member, unsuccessful.  Unable to leave voice message as mailbox is full.  Will follow up within the next 3-4 business days. ? ?[name], RN, MSN, CCM ?[facility] Care Management  ?Community Care Manager ?[phone] ? ?

## 2021-06-16 NOTE — Patient Outreach (Signed)
?[facility]) Care Management ?Telephonic RN Care Manager Note ? ? ?06/16/2021 ?Name:  Shannon [name] MRN:  [id number] DOB:  12/24/1923 ? ?Summary: ?Incoming call received from member after missed call this morning.  Denies any urgent concerns, encouraged to contact this care manager with questions.  ? ?Recommendations/Changes made from today's visit: ?Discuss with daughter plan of care should infection not clear (further surgery versus long term non-surgical plan of care).   ? ?Subjective: ?Shannon [name] is an 88 y.o. year old female who is a primary patient of Sharon Seller, NP. The care management team was consulted for assistance with care management and/or care coordination needs.   ? ?Telephonic RN Care Manager completed Telephone Visit today. ? ?Objective:  ? ?Medications Reviewed Today   ? ? Reviewed by Elveria Royals, CMA (Certified Medical Assistant) on 06/08/21 at 1325  Med List Status: <None>  ? ?Medication Order Taking? Sig Documenting Provider Last Dose Status Informant  ?acetaminophen (TYLENOL) 500 MG tablet [id number] Yes Take 500 mg by mouth every 6 (six) hours as needed for moderate pain. [provider] Taking Active Child  ?amLODipine (NORVASC) 5 MG tablet [id number] Yes TAKE 1 TABLET BY MOUTH EVERY DAY Sharon Seller, NP Taking Active   ?aspirin 81 MG EC tablet [id number] Yes Take 1 tablet (81 mg total) by mouth daily. Swallow whole. Leonie Douglas, MD Taking Active Child  ?         ?Med Note Porfirio Mylar   Tue Feb 15, 2021 10:20 AM)    ?atorvastatin (LIPITOR) 20 MG tablet [id number] Yes Take 0.5 tablets (10 mg total) by mouth daily. Alba Cory, MD Taking Active   ?b complex vitamins tablet [id number] Yes Take 1 tablet by mouth daily.  [provider] Taking Active Child  ?cholecalciferol (VITAMIN D3) 25 MCG (1000 UNIT) tablet [id number] Yes Take 1,000 Units by mouth daily. [provider] Taking Active Child  ?cycloSPORINE (RESTASIS)  0.05 % ophthalmic emulsion [id number] Yes USE 1 DROP INTO BOTH EYES TWICE DAILY Kirt Boys, DO Taking Active Child  ?         ?Med Note Porfirio Mylar   Tue Feb 15, 2021 10:21 AM)    ?doxycycline (VIBRA-TABS) 100 MG tablet [id number] Yes Take 1 tablet (100 mg total) by mouth daily. Felecia Shelling, DPM Taking Active   ?DULoxetine (CYMBALTA) 30 MG capsule [id number] Yes TAKE 1 CAPSULE BY MOUTH EVERY DAY Janyth Contes Janene Harvey, NP Taking Active Child  ?         ?Med Note Porfirio Mylar   Tue Feb 15, 2021 10:21 AM)    ?ferrous sulfate 220 (44 Fe) MG/5ML solution [id number] Yes TAKE 7.4 MLS (325 MG TOTAL) BY MOUTH DAILY.  ?Patient taking differently: Place 220 mg into feeding tube daily.  ? Sharon Seller, NP Taking Active   ?         ?Med Note [facility], CARLOS A   Wed May 04, 2021  2:21 PM)    ?fluticasone (FLONASE) 50 MCG/ACT nasal spray [id number] Yes 2 sprays into each nostril daily. Sharon Seller, NP Taking Active Child  ?         ?Med Note Porfirio Mylar   Tue Feb 15, 2021 10:22 AM)    ?furosemide (LASIX) 40 MG tablet [id number] Yes TAKE 0.5-1 TABLETS (20-40 MG TOTAL) BY MOUTH SEE ADMIN INSTRUCTIONS. TAKE 20 MG DAILY, MAY INSTEAD TAKE 40 MG DAILY AS NEEDED FOR EDEMA  Meriam Sprague, MD Taking Active Child  ?         ?Med Note Porfirio Mylar   Tue Feb 15, 2021 10:22 AM)    ?gabapentin (NEURONTIN) 300 MG capsule [id number] Yes TAKE 1 CAPSULE BY MOUTH TWICE A DAY Eubanks, Janene Harvey, NP Taking Active Child  ?         ?Med Note Porfirio Mylar   Tue Feb 15, 2021 10:22 AM)    ?gentamicin cream (GARAMYCIN) 0.1 % [id number] Yes Apply 1 application topically daily as needed (dry skin). Felecia Shelling, DPM Taking Active Child  ?hydrALAZINE (APRESOLINE) 50 MG tablet [id number] Yes Take 1 tablet (50 mg total) by mouth 3 (three) times daily. Meriam Sprague, MD Taking Active   ?Insulin Pen Needle (B-D UF III MINI PEN NEEDLES) 31G X 5 MM MISC [id number] Yes Use twice daily with giving insulin  injections. Dx E11.9 Sharon Seller, NP Taking Active Child  ?latanoprost (XALATAN) 0.005 % ophthalmic solution [id number] Yes Place 1 drop into both eyes at bedtime. [provider] Taking Active Child  ?         ?Med Note Porfirio Mylar   Tue Feb 15, 2021 10:23 AM)    ?levothyroxine (SYNTHROID) 125 MCG tablet [id number] Yes TAKE 1 TABLET BY MOUTH EVERY DAY Janyth Contes Janene Harvey, NP Taking Active Child  ?         ?Med Note Porfirio Mylar   Tue Feb 15, 2021 10:11 AM)    ?liver oil-zinc oxide (DESITIN) 40 % ointment [id number] Yes Apply topically 2 (two) times daily. Regalado, Belkys A, MD Taking Active   ?loratadine (CLARITIN) 10 MG tablet [id number] Yes Take 10 mg by mouth daily. [provider] Taking Active Child  ?mometasone-formoterol (DULERA) 200-5 MCG/ACT AERO [id number] Yes INHALE TWO PUFFS BY MOUTH TWICE DAILY Janyth Contes Janene Harvey, NP Taking Active Child  ?         ?Med Note [facility], CARLOS A   Wed May 04, 2021  2:26 PM)    ?Multiple Vitamin (MULTIVITAMIN WITH MINERALS) TABS tablet [id number] Yes Take 1 tablet by mouth daily. [provider] Taking Active Child  ?Nutritional Supplements (FEEDING SUPPLEMENT, OSMOLITE 1.5 CAL,) LIQD [id number] Yes Place 711 mLs into feeding tube See admin instructions. Take 711 ml over a 12 hour period per day 9pm-9am at a rate of 65 [provider] Taking Active Child  ?nystatin cream (MYCOSTATIN) [id number] Yes APPLY TO AFFECTED AREA TWICE A DAY Janyth Contes, Janene Harvey, NP Taking Active   ?oxyCODONE (ROXICODONE) 5 MG immediate release tablet [id number] Yes Take 0.5-1 tablets (2.5-5 mg total) by mouth every 8 (eight) hours as needed for severe pain. Sharon Seller, NP Taking Active Child  ?pantoprazole (PROTONIX) 40 MG tablet [id number] Yes Take 1 tablet (40 mg total) by mouth 2 (two) times daily. Sharon Seller, NP Taking Active Child  ?         ?Med Note Porfirio Mylar   Tue Feb 15, 2021 10:17 AM)    ?Polyethyl Glycol-Propyl  Glycol (SYSTANE) 0.4-0.3 % GEL ophthalmic gel [id number] Yes Place 1 application into both eyes 2 (two) times daily as needed (moisture). [provider] Taking Active Child  ?Probiotic Product (EQL PROBIOTIC COLON SUPPORT PO) [id number] Yes Take 1 capsule by mouth daily. [provider] Taking Active Child  ?senna-docusate (SENOKOT-S) 8.6-50 MG tablet [id number] Yes Take 1 tablet by mouth at bedtime. [provider] Taking Active Child  ?Sodium Chloride-Sodium  Bicarb (NETI POT SINUS WASH NA) [id number] Yes Place 1 application into the nose at bedtime. [provider] Taking Active Child  ?         ?Med Note [facility], CARLOS A   Wed May 04, 2021  2:26 PM)    ?sucralfate (CARAFATE) 1 GM/10ML suspension [id number] Yes Take 10 mLs (1 g total) by mouth every 6 (six) hours. Regalado, Prentiss Bells, MD Taking Active   ?traMADol (ULTRAM) 50 MG tablet [id number] Yes Place 1 tablet (50 mg total) into feeding tube every 6 (six) hours as needed for severe pain. Lars Mage, PA-C Taking Active Child  ?Water For Irrigation, Sterile (FREE WATER) SOLN [id number] Yes Place 180 mLs into feeding tube 3 (three) times daily. [provider] Taking Active Child  ?White Petrolatum-Mineral Oil (SYSTANE NIGHTTIME) OINT [id number] Yes Place 1 application into both eyes at bedtime.  [provider] Taking Active Child  ? ?  ?  ? ?  ? ? ? ?SDOH:  (Social Determinants of Health) assessments and interventions performed:  ? ? ? ?Care Plan ? ?Review of patient past medical history, allergies, medications, health status, including review of consultants reports, laboratory and other test data, was performed as part of comprehensive evaluation for care management services.  ? ?Care Plan : General Plan of Care (Adult)  ?Updates made by Kemper Durie, RN since 06/16/2021 12:00 AM  ?  ? ?Problem: Difficulty managing chronic medical conditions (CHF, DM, HTN, PVD)   ?Priority: High  ?  ? ?Long-Range Goal:  Member and family will report adequate management of chronic health conditions (CHF, DM, HTN, PVD)   ?Start Date: 02/21/2021  ?Expected End Date: 08/21/2021  ?This Visit's Progress: On track  ?Recent [place]

## 2021-06-19 ENCOUNTER — Other Ambulatory Visit: Payer: Self-pay | Admitting: Podiatry

## 2021-06-20 NOTE — Progress Notes (Signed)
?Office Note  ? ? ? ?CC:  follow up ?Requesting Provider:  Sharon Seller, NP ? ?HPI: Shannon [name] is a 88 y.o. (11/29/1923) female who presents for follow up of peripheral artery disease. She has undergone intervention on bilateral lower extremities. She most recently is status post right lower extremity angiogram with second-order cannulation by Dr. Lenell Antu on December 24, 2020. This was diagnostic with no intervention indicated. She has single inline flow via the peroneal artery. She had previously undergone angiography in July of 2022 by Dr. Lenell Antu with treatment of left popliteal lesion.She required right common femoral artery repair due to hematoma following this procedure. She then underwent irrigation and debridement of bilateral foot ulcers by Dr. Ardelle Anton on December 28, 2020. She continues to received wound care to right foot ulcer by Dr. Ardelle Anton. Previous right TMA. The left foot ulcer has since healed. The right foot ulcer a HH RN comes once a week to evaluate otherwise patients daughter does daily dressing changes. Patient reports frequent pain in right leg and foot.This occurs mostly on ambulation. She very intermittently has some pain in left foot. This is at night and is a tingling/ pins and needles pain. She does have neuropathy. She also has a lot of tenderness in her heels. She does float her heels on a pillow at night. She says she is usually on the go during the day. She does not ambulate outside but walks around the house a lot. She uses a rolling walker.  ? ?The pt is on a statin for cholesterol management.  ?The pt is on a daily aspirin.   Other AC:  None ?The pt is on CCB for hypertension.   ?The pt is diabetic.   ?Tobacco hx:  Former snuff user, quit 1982 ? ?Past Medical History:  ?Diagnosis Date  ? Acute respiratory failure (HCC)   ? Anemia   ? previous blood transfusions  ? Arthritis   ? "all over"  ? Asthma   ? Atrial flutter (HCC)   ? Bradycardia   ? requiring previous d/c of BB  and reduction of amiodarone  ? CAD (coronary artery disease)   ? nonobstructive per notes  ? Chronic diastolic CHF (congestive heart failure) (H[facility])   ? CKD (chronic kidney disease), stage III (HCC)   ? Complication of blood transfusion   ? "got the wrong blood type at [facility] in ~ 2015; no adverse reaction that we are aware of"/daughter, [name] (01/27/2016)  ? COPD (chronic obstructive pulmonary disease) (HCC)   ? Depression   ? "light case"  ? DVT (deep venous thrombosis) ([facility]) 01/2016  ? a. LLE DVT 01/2016 - switched from Eliquis to Coumadin.  ? Dyspnea   ? with some activity  ? Gastric stenosis   ? a. s/p stomach tube  ? GERD (gastroesophageal reflux disease)   ? GI bleed   ? Headache   ? History of 2019 novel coronavirus disease (COVID-19)   ? History of blood transfusion   ? "several" (01/27/2016)  ? History of stomach ulcers   ? Hyperlipidemia   ? Hypertension   ? Hypothyroidism   ? On home oxygen therapy   ? "2L; 9PM til 9AM" (06/27/2017)  ? PAD (peripheral artery disease) (HCC)   ? a. prior gangrene, toe amputations, intervention.  ? PAF (paroxysmal atrial fibrillation) (HCC)   ? Paraesophageal hernia   ? Perforated gastric ulcer (HCC)   ? Pneumonia   ? "a few times" (06/27/2017)  ? Seasonal  allergies   ? SIADH (syndrome of inappropriate ADH production) (HCC)   ? [name] 01/10/2015  ? Small bowel obstruction (HCC)   ? "I don't know how many" (01/11/2015)  ? Stroke [facility])   ? several- no residual  ? Type II diabetes mellitus (HCC)   ? "related to prednisone use  for > 20 yr; once predinose stopped; no more DM RX" (01/27/2016)  ? UTI (urinary tract infection) 02/08/2016  ? Ventral hernia with bowel obstruction   ? ? ?Past Surgical History:  ?Procedure Laterality Date  ? ABDOMINAL AORTOGRAM N/A 09/21/2016  ? Procedure: Abdominal Aortogram;  Surgeon: Maeola Harman, MD;  Location: [facility] INVASIVE CV LAB;  Service: Cardiovascular;  Laterality: N/A;  ? ABDOMINAL AORTOGRAM W/LOWER EXTREMITY N/A 10/20/2020  ?  Procedure: ABDOMINAL AORTOGRAM W/LOWER EXTREMITY;  Surgeon: Leonie Douglas, MD;  Location: [facility] INVASIVE CV LAB;  Service: Cardiovascular;  Laterality: N/A;  ? AMPUTATION Left 09/25/2016  ? Procedure: AMPUTATION DIGIT- LEFT 2ND AND 3RD TOES;  Surgeon: Larina Earthly, MD;  Location: [facility] OR;  Service: Vascular;  Laterality: Left;  ? AMPUTATION Right 05/03/2018  ? Procedure: AMPUTATION RIGHT GREAT TOE;  Surgeon: Larina Earthly, MD;  Location: [facility] OR;  Service: Vascular;  Laterality: Right;  ? CATARACT EXTRACTION W/ INTRAOCULAR LENS  IMPLANT, BILATERAL    ? CHOLECYSTECTOMY OPEN    ? COLECTOMY    ? ESOPHAGOGASTRODUODENOSCOPY N/A 01/19/2014  ? Procedure: ESOPHAGOGASTRODUODENOSCOPY (EGD);  Surgeon: Hilarie Fredrickson, MD;  Location: [facility] ENDOSCOPY;  Service: Endoscopy;  Laterality: N/A;  ? ESOPHAGOGASTRODUODENOSCOPY N/A 01/20/2014  ? Procedure: ESOPHAGOGASTRODUODENOSCOPY (EGD);  Surgeon: Hilarie Fredrickson, MD;  Location: [facility] ENDOSCOPY;  Service: Endoscopy;  Laterality: N/A;  ? ESOPHAGOGASTRODUODENOSCOPY N/A 03/19/2014  ? Procedure: ESOPHAGOGASTRODUODENOSCOPY (EGD);  Surgeon: Rachael Fee, MD;  Location: [facility] ENDOSCOPY;  Service: Endoscopy;  Laterality: N/A;  ? ESOPHAGOGASTRODUODENOSCOPY N/A 07/08/2015  ? Procedure: ESOPHAGOGASTRODUODENOSCOPY (EGD);  Surgeon: Sherrilyn Rist, MD;  Location[facility] ENDOSCOPY;  Service: Endoscopy;  Laterality: N/A;  ? ESOPHAGOGASTRODUODENOSCOPY (EGD) WITH PROPOFOL N/A 09/15/2015  ? Procedure: ESOPHAGOGASTRODUODENOSCOPY (EGD) WITH PROPOFOL;  Surgeon: Ruffin Frederick, MD;  Location: [facility] ENDOSCOPY;  Service: Gastroenterology;  Laterality: N/A;  ? ESOPHAGOGASTRODUODENOSCOPY (EGD) WITH PROPOFOL N/A 10/12/2020  ? Procedure: ESOPHAGOGASTRODUODENOSCOPY (EGD) WITH PROPOFOL;  Surgeon: Iva Boop, MD;  Location: [facility] ENDOSCOPY;  Service: Endoscopy;  Laterality: N/A;  ? FOOT SURGERY    ? GASTROJEJUNOSTOMY    ? hx/notes 01/10/2015  ? GASTROJEJUNOSTOMY N/A 09/23/2015  ? Procedure: OPEN GASTROJEJUNOSTOMY TUBE  PLACEMENT;  Surgeon: De Blanch Kinsinger, MD;  Location: [facility] ORS;  Service: General;  Laterality: N/A;  ? GLAUCOMA SURGERY Bilateral   ? HEMATOMA EVACUATION Right 10/20/2020  ? Procedure: EVACUATION HEMATOMA RIGHT GROIN;  Surgeon: Leonie Douglas, MD;  Location: [facility] OR;  Service: Vascular;  Laterality: Right;  ? IR CM INJ ANY COLONIC TUBE W/FLUORO  01/05/2017  ? IR CM INJ ANY COLONIC TUBE W/FLUORO  06/07/2017  ? IR CM INJ ANY COLONIC TUBE W/FLUORO  11/05/2017  ? IR CM INJ ANY COLONIC TUBE W/FLUORO  09/18/2018  ? IR CM INJ ANY COLONIC TUBE W/FLUORO  03/06/2019  ? IR CM INJ ANY COLONIC TUBE W/FLUORO  03/11/2019  ? IR GASTR TUBE CONVERT GASTR-JEJ PER W/FL MOD SED  04/30/2020  ? IR GENERIC HISTORICAL  01/07/2016  ? IR GJ TUBE CHANGE 01/07/2016 Malachy Moan, MD [facility]-INTER[facility] RAD  ? IR GENERIC HISTORICAL  01/27/2016  ? IR MECH REMOV OBSTRUC MAT ANY COLON TUBE W/FLUORO  01/27/2016 Richarda Overlie, MD [facility]  ? IR GENERIC HISTORICAL  02/07/2016  ? IR PATIENT EVAL TECH 0-60 MINS Darrell K Allred, PA-C [facility]  ? IR GENERIC HISTORICAL  02/08/2016  ? IR GJ TUBE CHANGE 02/08/2016 Berdine Dance, MD [facility]  ? IR GENERIC HISTORICAL  01/06/2016  ? IR GJ TUBE CHANGE 01/06/2016 [facility] OUT REF  ? IR GENERIC HISTORICAL  05/02/2016  ? IR CM INJ ANY COLONIC TUBE W/FLUORO 05/02/2016 Oley Balm, MD [facility]  ? IR GENERIC HISTORICAL  05/15/2016  ? IR GJ TUBE CHANGE 05/15/2016 Simonne Come, MD [facility]  ? IR GENERIC HISTORICAL  06/28/2016  ? IR GJ TUBE CHANGE 06/28/2016 [facility]  ? IR GJ TUBE CHANGE  02/20/2017  ? IR GJ TUBE CHANGE  05/10/2017  ? IR GJ TUBE CHANGE  07/06/2017  ? IR GJ TUBE CHANGE  08/02/2017  ? IR GJ TUBE CHANGE  10/15/2017  ? IR GJ TUBE CHANGE  12/26/2017  ? IR GJ TUBE CHANGE  04/08/2018  ? IR GJ TUBE CHANGE  06/19/2018  ? IR GJ TUBE CHANGE  03/03/2019  ? IR GJ TUBE CHANGE  06/06/2019  ? IR GJ TUBE CHANGE  09/26/2019  ? IR GJ TUBE CHANGE  01/09/2020  ? IR GJ TUBE CHANGE  05/12/2020  ? IR GJ TUBE  CHANGE  10/05/2020  ? IR GJ TUBE CHANGE  10/14/2020  ? IR GJ TUBE CHANGE  10/19/2020  ? IR GJ TUBE CHANGE  02/25/2021  ? IR GJ TUBE CHANGE  03/23/2021  ? IR PATIENT EVAL TECH 0-60 MINS  10/19/2016  ? IR PATIENT EVA

## 2021-06-21 ENCOUNTER — Ambulatory Visit (INDEPENDENT_AMBULATORY_CARE_PROVIDER_SITE_OTHER): Payer: Medicare Other | Admitting: Physician Assistant

## 2021-06-21 ENCOUNTER — Ambulatory Visit (HOSPITAL_COMMUNITY)
Admission: RE | Admit: 2021-06-21 | Discharge: 2021-06-21 | Disposition: A | Discharge status: Home / Self Care | Payer: Medicare Other | Source: Ambulatory Visit | Attending: Vascular Surgery | Admitting: Vascular Surgery

## 2021-06-21 ENCOUNTER — Other Ambulatory Visit: Payer: Self-pay

## 2021-06-21 VITALS — BP 170/80 | HR 84 | Temp 97.6°F | Resp 20 | Ht 66.0 in | Wt 123.0 lb

## 2021-06-21 DIAGNOSIS — I70245 Atherosclerosis of native arteries of left leg with ulceration of other part of foot: Secondary | ICD-10-CM | POA: Diagnosis not present

## 2021-06-21 DIAGNOSIS — I739 Peripheral vascular disease, unspecified: Secondary | ICD-10-CM

## 2021-06-22 ENCOUNTER — Ambulatory Visit: Payer: Self-pay | Admitting: *Deleted

## 2021-06-23 DIAGNOSIS — E1169 Type 2 diabetes mellitus with other specified complication: Secondary | ICD-10-CM | POA: Diagnosis not present

## 2021-06-23 DIAGNOSIS — E1151 Type 2 diabetes mellitus with diabetic peripheral angiopathy without gangrene: Secondary | ICD-10-CM | POA: Diagnosis not present

## 2021-06-23 DIAGNOSIS — E1142 Type 2 diabetes mellitus with diabetic polyneuropathy: Secondary | ICD-10-CM | POA: Diagnosis not present

## 2021-06-23 DIAGNOSIS — J9610 Chronic respiratory failure, unspecified whether with hypoxia or hypercapnia: Secondary | ICD-10-CM | POA: Diagnosis not present

## 2021-06-23 DIAGNOSIS — M86172 Other acute osteomyelitis, left ankle and foot: Secondary | ICD-10-CM | POA: Diagnosis not present

## 2021-06-23 DIAGNOSIS — E039 Hypothyroidism, unspecified: Secondary | ICD-10-CM | POA: Diagnosis not present

## 2021-06-29 ENCOUNTER — Other Ambulatory Visit: Payer: Self-pay | Admitting: Nurse Practitioner

## 2021-07-01 ENCOUNTER — Other Ambulatory Visit: Payer: Self-pay | Admitting: *Deleted

## 2021-07-01 DIAGNOSIS — I70245 Atherosclerosis of native arteries of left leg with ulceration of other part of foot: Secondary | ICD-10-CM

## 2021-07-01 DIAGNOSIS — I739 Peripheral vascular disease, unspecified: Secondary | ICD-10-CM

## 2021-07-02 ENCOUNTER — Other Ambulatory Visit: Payer: Self-pay | Admitting: Nurse Practitioner

## 2021-07-04 ENCOUNTER — Other Ambulatory Visit (HOSPITAL_COMMUNITY): Payer: Self-pay | Admitting: Physician Assistant

## 2021-07-04 ENCOUNTER — Encounter (HOSPITAL_COMMUNITY): Payer: Self-pay | Admitting: Radiology

## 2021-07-04 DIAGNOSIS — R633 Feeding difficulties, unspecified: Secondary | ICD-10-CM

## 2021-07-05 ENCOUNTER — Other Ambulatory Visit: Payer: Self-pay | Admitting: Nurse Practitioner

## 2021-07-06 NOTE — Telephone Encounter (Signed)
Medication under Shannon Browns, MD name.  ? ?Please advise.  ?

## 2021-07-07 ENCOUNTER — Telehealth: Payer: Self-pay | Admitting: *Deleted

## 2021-07-07 DIAGNOSIS — M86172 Other acute osteomyelitis, left ankle and foot: Secondary | ICD-10-CM | POA: Diagnosis not present

## 2021-07-07 DIAGNOSIS — E039 Hypothyroidism, unspecified: Secondary | ICD-10-CM | POA: Diagnosis not present

## 2021-07-07 DIAGNOSIS — E1142 Type 2 diabetes mellitus with diabetic polyneuropathy: Secondary | ICD-10-CM | POA: Diagnosis not present

## 2021-07-07 DIAGNOSIS — E1151 Type 2 diabetes mellitus with diabetic peripheral angiopathy without gangrene: Secondary | ICD-10-CM | POA: Diagnosis not present

## 2021-07-07 DIAGNOSIS — J9610 Chronic respiratory failure, unspecified whether with hypoxia or hypercapnia: Secondary | ICD-10-CM | POA: Diagnosis not present

## 2021-07-07 DIAGNOSIS — E1169 Type 2 diabetes mellitus with other specified complication: Secondary | ICD-10-CM | POA: Diagnosis not present

## 2021-07-07 NOTE — Telephone Encounter (Signed)
Shannon [name] with [facility] called and requesting verbal order for: ? ?1.) Skilled Nursing 1X1 and then recert for services.  ? ?2.) Needs Approval from PCP to take Orders from Dr. Natale Milch at [facility].  ? ?Please Advise.  ?

## 2021-07-11 NOTE — Telephone Encounter (Signed)
Shannon Chandler, NP  You 1 hour ago (10:12 AM)  ? ?Yes this is okay    ? ? ?Juliann Pulse Notified and agreed.  ?

## 2021-07-11 NOTE — Telephone Encounter (Signed)
Juliann Pulse with [facility] [phone] called again requesting verbal orders.  ? ?Please Advise.  ?

## 2021-07-12 DIAGNOSIS — M86172 Other acute osteomyelitis, left ankle and foot: Secondary | ICD-10-CM | POA: Diagnosis not present

## 2021-07-12 DIAGNOSIS — E039 Hypothyroidism, unspecified: Secondary | ICD-10-CM | POA: Diagnosis not present

## 2021-07-12 DIAGNOSIS — E1151 Type 2 diabetes mellitus with diabetic peripheral angiopathy without gangrene: Secondary | ICD-10-CM | POA: Diagnosis not present

## 2021-07-12 DIAGNOSIS — E1142 Type 2 diabetes mellitus with diabetic polyneuropathy: Secondary | ICD-10-CM | POA: Diagnosis not present

## 2021-07-12 DIAGNOSIS — J9610 Chronic respiratory failure, unspecified whether with hypoxia or hypercapnia: Secondary | ICD-10-CM | POA: Diagnosis not present

## 2021-07-12 DIAGNOSIS — E1169 Type 2 diabetes mellitus with other specified complication: Secondary | ICD-10-CM | POA: Diagnosis not present

## 2021-07-13 DIAGNOSIS — F325 Major depressive disorder, single episode, in full remission: Secondary | ICD-10-CM | POA: Diagnosis not present

## 2021-07-13 DIAGNOSIS — I4819 Other persistent atrial fibrillation: Secondary | ICD-10-CM | POA: Diagnosis not present

## 2021-07-13 DIAGNOSIS — E785 Hyperlipidemia, unspecified: Secondary | ICD-10-CM | POA: Diagnosis not present

## 2021-07-13 DIAGNOSIS — J9610 Chronic respiratory failure, unspecified whether with hypoxia or hypercapnia: Secondary | ICD-10-CM | POA: Diagnosis not present

## 2021-07-13 DIAGNOSIS — K285 Chronic or unspecified gastrojejunal ulcer with perforation: Secondary | ICD-10-CM | POA: Diagnosis not present

## 2021-07-13 DIAGNOSIS — E039 Hypothyroidism, unspecified: Secondary | ICD-10-CM | POA: Diagnosis not present

## 2021-07-13 DIAGNOSIS — K439 Ventral hernia without obstruction or gangrene: Secondary | ICD-10-CM | POA: Diagnosis not present

## 2021-07-13 DIAGNOSIS — D631 Anemia in chronic kidney disease: Secondary | ICD-10-CM | POA: Diagnosis not present

## 2021-07-13 DIAGNOSIS — K219 Gastro-esophageal reflux disease without esophagitis: Secondary | ICD-10-CM | POA: Diagnosis not present

## 2021-07-13 DIAGNOSIS — J449 Chronic obstructive pulmonary disease, unspecified: Secondary | ICD-10-CM | POA: Diagnosis not present

## 2021-07-13 DIAGNOSIS — M150 Primary generalized (osteo)arthritis: Secondary | ICD-10-CM | POA: Diagnosis not present

## 2021-07-13 DIAGNOSIS — E1169 Type 2 diabetes mellitus with other specified complication: Secondary | ICD-10-CM | POA: Diagnosis not present

## 2021-07-13 DIAGNOSIS — K5900 Constipation, unspecified: Secondary | ICD-10-CM | POA: Diagnosis not present

## 2021-07-13 DIAGNOSIS — G8929 Other chronic pain: Secondary | ICD-10-CM | POA: Diagnosis not present

## 2021-07-13 DIAGNOSIS — I130 Hypertensive heart and chronic kidney disease with heart failure and stage 1 through stage 4 chronic kidney disease, or unspecified chronic kidney disease: Secondary | ICD-10-CM | POA: Diagnosis not present

## 2021-07-13 DIAGNOSIS — E1151 Type 2 diabetes mellitus with diabetic peripheral angiopathy without gangrene: Secondary | ICD-10-CM | POA: Diagnosis not present

## 2021-07-13 DIAGNOSIS — M86172 Other acute osteomyelitis, left ankle and foot: Secondary | ICD-10-CM | POA: Diagnosis not present

## 2021-07-13 DIAGNOSIS — I4892 Unspecified atrial flutter: Secondary | ICD-10-CM | POA: Diagnosis not present

## 2021-07-13 DIAGNOSIS — K311 Adult hypertrophic pyloric stenosis: Secondary | ICD-10-CM | POA: Diagnosis not present

## 2021-07-13 DIAGNOSIS — I2510 Atherosclerotic heart disease of native coronary artery without angina pectoris: Secondary | ICD-10-CM | POA: Diagnosis not present

## 2021-07-13 DIAGNOSIS — Z79891 Long term (current) use of opiate analgesic: Secondary | ICD-10-CM | POA: Diagnosis not present

## 2021-07-13 DIAGNOSIS — E1142 Type 2 diabetes mellitus with diabetic polyneuropathy: Secondary | ICD-10-CM | POA: Diagnosis not present

## 2021-07-13 DIAGNOSIS — I5033 Acute on chronic diastolic (congestive) heart failure: Secondary | ICD-10-CM | POA: Diagnosis not present

## 2021-07-13 DIAGNOSIS — E1122 Type 2 diabetes mellitus with diabetic chronic kidney disease: Secondary | ICD-10-CM | POA: Diagnosis not present

## 2021-07-13 DIAGNOSIS — N184 Chronic kidney disease, stage 4 (severe): Secondary | ICD-10-CM | POA: Diagnosis not present

## 2021-07-14 ENCOUNTER — Other Ambulatory Visit: Payer: Self-pay | Admitting: *Deleted

## 2021-07-14 NOTE — Patient Outreach (Signed)
?[facility] Customer service manager [facility]) Care Management ?Telephonic RN Care Manager Note ? ? ?07/14/2021 ?Name:  Shannon [name] MRN:  [id number] DOB:  13-Dec-1923 ? ?Summary: ?Outgoing call placed to member, successful.  Denies any urgent concerns, encouraged to contact this care manager with questions.  ? ?Subjective: ?Shannon [name] is an 88 y.o. year old female who is a primary patient of Sharon Seller, NP. The care management team was consulted for assistance with care management and/or care coordination needs.   ? ?Telephonic RN Care Manager completed Telephone Visit today. ? ?Objective:  ? ?Medications Reviewed Today   ? ? Reviewed by Graceann Congress, PA-C (Physician Assistant Certified) on 06/21/21 at 1441  Med List Status: <None>  ? ?Medication Order Taking? Sig Documenting Provider Last Dose Status Informant  ?acetaminophen (TYLENOL) 500 MG tablet [id number] Yes Take 500 mg by mouth every 6 (six) hours as needed for moderate pain. [provider] Taking Active Child  ?amLODipine (NORVASC) 5 MG tablet [id number] Yes TAKE 1 TABLET BY MOUTH EVERY DAY Sharon Seller, NP Taking Active   ?aspirin 81 MG EC tablet [id number] Yes Take 1 tablet (81 mg total) by mouth daily. Swallow whole. Leonie Douglas, MD Taking Active Child  ?         ?Med Note Porfirio Mylar   Tue Feb 15, 2021 10:20 AM)    ?atorvastatin (LIPITOR) 20 MG tablet [id number] Yes Take 0.5 tablets (10 mg total) by mouth daily. Alba Cory, MD Taking Active   ?b complex vitamins tablet [id number] Yes Take 1 tablet by mouth daily.  [provider] Taking Active Child  ?cholecalciferol (VITAMIN D3) 25 MCG (1000 UNIT) tablet [id number] Yes Take 1,000 Units by mouth daily. [provider] Taking Active Child  ?cycloSPORINE (RESTASIS) 0.05 % ophthalmic emulsion [id number] Yes USE 1 DROP INTO BOTH EYES TWICE DAILY Kirt Boys, DO Taking Active Child  ?         ?Med Note Porfirio Mylar   Tue Feb 15, 2021 10:21 AM)     ?doxycycline (VIBRA-TABS) 100 MG tablet [id number] Yes TAKE 1 TABLET BY MOUTH TWICE A DAY Logan Bores Larena Glassman, DPM Taking Active   ?DULoxetine (CYMBALTA) 30 MG capsule [id number] Yes TAKE 1 CAPSULE BY MOUTH EVERY DAY Janyth Contes Janene Harvey, NP Taking Active Child  ?         ?Med Note Porfirio Mylar   Tue Feb 15, 2021 10:21 AM)    ?ferrous sulfate 220 (44 Fe) MG/5ML solution [id number] Yes TAKE 7.4 MLS (325 MG TOTAL) BY MOUTH DAILY.  ?Patient taking differently: Place 220 mg into feeding tube daily.  ? Sharon Seller, NP Taking Active   ?         ?Med Note [facility], CARLOS A   Wed May 04, 2021  2:21 PM)    ?fluticasone (FLONASE) 50 MCG/ACT nasal spray [id number] Yes 2 sprays into each nostril daily. Sharon Seller, NP Taking Active Child  ?         ?Med Note Porfirio Mylar   Tue Feb 15, 2021 10:22 AM)    ?furosemide (LASIX) 40 MG tablet [id number] Yes TAKE 0.5-1 TABLETS (20-40 MG TOTAL) BY MOUTH SEE ADMIN INSTRUCTIONS. TAKE 20 MG DAILY, MAY INSTEAD TAKE 40 MG DAILY AS NEEDED FOR EDEMA Meriam Sprague, MD Taking Active Child  ?         ?Med Note Porfirio Mylar   Tue Feb 15, 2021 10:22 AM)    ?  gabapentin (NEURONTIN) 300 MG capsule [id number] Yes TAKE 1 CAPSULE BY MOUTH TWICE A DAY Eubanks, Janene Harvey, NP Taking Active Child  ?         ?Med Note Porfirio Mylar   Tue Feb 15, 2021 10:22 AM)    ?gentamicin cream (GARAMYCIN) 0.1 % [id number] Yes Apply 1 application topically daily as needed (dry skin). Felecia Shelling, DPM Taking Active Child  ?hydrALAZINE (APRESOLINE) 50 MG tablet [id number] Yes Take 1 tablet (50 mg total) by mouth 3 (three) times daily. Meriam Sprague, MD Taking Active   ?Insulin Pen Needle (B-D UF III MINI PEN NEEDLES) 31G X 5 MM MISC [id number] Yes Use twice daily with giving insulin injections. Dx E11.9 Sharon Seller, NP Taking Active Child  ?latanoprost (XALATAN) 0.005 % ophthalmic solution [id number] Yes Place 1 drop into both eyes at bedtime. [provider] Taking  Active Child  ?         ?Med Note Porfirio Mylar   Tue Feb 15, 2021 10:23 AM)    ?levothyroxine (SYNTHROID) 125 MCG tablet [id number] Yes TAKE 1 TABLET BY MOUTH EVERY DAY Janyth Contes Janene Harvey, NP Taking Active Child  ?         ?Med Note Porfirio Mylar   Tue Feb 15, 2021 10:11 AM)    ?liver oil-zinc oxide (DESITIN) 40 % ointment [id number] Yes Apply topically 2 (two) times daily. Regalado, Belkys A, MD Taking Active   ?loratadine (CLARITIN) 10 MG tablet [id number] Yes Take 10 mg by mouth daily. [provider] Taking Active Child  ?mometasone-formoterol (DULERA) 200-5 MCG/ACT AERO [id number] Yes INHALE TWO PUFFS BY MOUTH TWICE DAILY Janyth Contes Janene Harvey, NP Taking Active Child  ?         ?Med Note [facility], CARLOS A   Wed May 04, 2021  2:26 PM)    ?Multiple Vitamin (MULTIVITAMIN WITH MINERALS) TABS tablet [id number] Yes Take 1 tablet by mouth daily. [provider] Taking Active Child  ?Nutritional Supplements (FEEDING SUPPLEMENT, OSMOLITE 1.5 CAL,) LIQD [id number] Yes Place 711 mLs into feeding tube See admin instructions. Take 711 ml over a 12 hour period per day 9pm-9am at a rate of 65 [provider] Taking Active Child  ?nystatin cream (MYCOSTATIN) [id number] Yes APPLY TO AFFECTED AREA TWICE A DAY Janyth Contes, Janene Harvey, NP Taking Active   ?pantoprazole (PROTONIX) 40 MG tablet [id number] Yes Take 1 tablet (40 mg total) by mouth 2 (two) times daily. Sharon Seller, NP Taking Active Child  ?         ?Med Note Porfirio Mylar   Tue Feb 15, 2021 10:17 AM)    ?Polyethyl Glycol-Propyl Glycol (SYSTANE) 0.4-0.3 % GEL ophthalmic gel [id number] Yes Place 1 application into both eyes 2 (two) times daily as needed (moisture). [provider] Taking Active Child  ?Probiotic Product (EQL PROBIOTIC COLON SUPPORT PO) [id number] Yes Take 1 capsule by mouth daily. [provider] Taking Active Child  ?senna-docusate (SENOKOT-S) 8.6-50 MG tablet [id number] Yes Take 1 tablet by mouth  at bedtime. [provider] Taking Active Child  ?Sodium Chloride-Sodium Bicarb (NETI POT SINUS WASH NA) [id number] Yes Place 1 application into the nose at bedtime. [provider] Taking Active Child  ?         ?Med Note [facility], CARLOS A   Wed May 04, 2021  2:26 PM)    ?traMADol (ULTRAM) 50 MG tablet [id number] Yes Place 1 tablet (50 mg total) into feeding tube every  6 (six) hours as needed for severe pain. Lars Mage, PA-C Taking Active Child  ?Water For Irrigation, Sterile (FREE WATER) SOLN [id number] Yes Place 180 mLs into feeding tube 3 (three) times daily. [provider] Taking Active Child  ?White Petrolatum-Mineral Oil (SYSTANE NIGHTTIME) OINT [id number] Yes Place 1 application into both eyes at bedtime.  [provider] Taking Active Child  ? ?  ?  ? ?  ? ? ? ?SDOH:  (Social Determinants of Health) assessments and interventions performed:  ? ? ? ?Care Plan ? ?Review of patient past medical history, allergies, medications, health status, including review of consultants reports, laboratory and other test data, was performed as part of comprehensive evaluation for care management services.  ? ?Care Plan : General Plan of Care (Adult)  ?Updates made by Kemper Durie, RN since 07/14/2021 12:00 AM  ?  ? ?Problem: Difficulty managing chronic medical conditions (CHF, DM, HTN, PVD)   ?Priority: High  ?  ? ?Long-Range Goal: Member and family will report adequate management of chronic health conditions (CHF, DM, HTN, PVD)   ?Start Date: 02/21/2021  ?Expected End Date: 08/21/2021  ?This Visit's Progress: On track  ?Recent Progress: On track  ?Priority: High  ?Note:   ?Current Barriers:  ?Chronic Disease Management support and education needs related to CHF, HTN, DMII, and PVD ? ?RNCM Clinical Goal(s):  ?Patient will verbalize understanding of plan for management of CHF, HTN, DMII, and PVD as evidenced by member/family verbalizing stability in member's status ?take all  medications exactly as prescribed and will call provider for medication related questions as evidenced by family reporting adherence    ?attend all scheduled medical appointments: PCP and Podiatry as evidenced by rec

## 2021-07-15 ENCOUNTER — Other Ambulatory Visit: Payer: Self-pay | Admitting: Podiatry

## 2021-07-15 DIAGNOSIS — Z20822 Contact with and (suspected) exposure to covid-19: Secondary | ICD-10-CM | POA: Diagnosis not present

## 2021-07-18 ENCOUNTER — Other Ambulatory Visit: Payer: Self-pay | Admitting: *Deleted

## 2021-07-18 MED ORDER — HYDRALAZINE HCL 50 MG PO TABS: 50.0000 mg | ORAL_TABLET | Freq: Three times a day (TID) | ORAL | 3 refills | Status: DC

## 2021-07-19 DIAGNOSIS — I4819 Other persistent atrial fibrillation: Secondary | ICD-10-CM | POA: Diagnosis not present

## 2021-07-19 DIAGNOSIS — E1151 Type 2 diabetes mellitus with diabetic peripheral angiopathy without gangrene: Secondary | ICD-10-CM | POA: Diagnosis not present

## 2021-07-19 DIAGNOSIS — J9610 Chronic respiratory failure, unspecified whether with hypoxia or hypercapnia: Secondary | ICD-10-CM | POA: Diagnosis not present

## 2021-07-19 DIAGNOSIS — M150 Primary generalized (osteo)arthritis: Secondary | ICD-10-CM | POA: Diagnosis not present

## 2021-07-19 DIAGNOSIS — M86172 Other acute osteomyelitis, left ankle and foot: Secondary | ICD-10-CM | POA: Diagnosis not present

## 2021-07-19 DIAGNOSIS — I4892 Unspecified atrial flutter: Secondary | ICD-10-CM | POA: Diagnosis not present

## 2021-07-19 DIAGNOSIS — E039 Hypothyroidism, unspecified: Secondary | ICD-10-CM | POA: Diagnosis not present

## 2021-07-19 DIAGNOSIS — E1142 Type 2 diabetes mellitus with diabetic polyneuropathy: Secondary | ICD-10-CM | POA: Diagnosis not present

## 2021-07-21 DIAGNOSIS — E1169 Type 2 diabetes mellitus with other specified complication: Secondary | ICD-10-CM | POA: Diagnosis not present

## 2021-07-21 DIAGNOSIS — E039 Hypothyroidism, unspecified: Secondary | ICD-10-CM | POA: Diagnosis not present

## 2021-07-21 DIAGNOSIS — E1151 Type 2 diabetes mellitus with diabetic peripheral angiopathy without gangrene: Secondary | ICD-10-CM | POA: Diagnosis not present

## 2021-07-21 DIAGNOSIS — E1142 Type 2 diabetes mellitus with diabetic polyneuropathy: Secondary | ICD-10-CM | POA: Diagnosis not present

## 2021-07-21 DIAGNOSIS — J9610 Chronic respiratory failure, unspecified whether with hypoxia or hypercapnia: Secondary | ICD-10-CM | POA: Diagnosis not present

## 2021-07-21 DIAGNOSIS — M86172 Other acute osteomyelitis, left ankle and foot: Secondary | ICD-10-CM | POA: Diagnosis not present

## 2021-07-27 ENCOUNTER — Ambulatory Visit (INDEPENDENT_AMBULATORY_CARE_PROVIDER_SITE_OTHER): Payer: Medicare Other | Admitting: Podiatry

## 2021-07-27 DIAGNOSIS — L97512 Non-pressure chronic ulcer of other part of right foot with fat layer exposed: Secondary | ICD-10-CM | POA: Diagnosis not present

## 2021-07-27 DIAGNOSIS — E0859 Diabetes mellitus due to underlying condition with other circulatory complications: Secondary | ICD-10-CM

## 2021-07-27 NOTE — Progress Notes (Signed)
? ?Subjective:  ?Patient presenting today for follow-up evaluation of chronic wounds to bilateral feet.  Patient states that she is doing well.  They have been changing the dressings 2 times per week and they feel that the wounds are stable.  No new complaints at this time ? ?Past Medical History:  ?Diagnosis Date  ? Acute respiratory failure ([place])   ? Anemia   ? previous blood transfusions  ? Arthritis   ? "all over"  ? Asthma   ? Atrial flutter ([place])   ? Bradycardia   ? requiring previous d/c of BB and reduction of amiodarone  ? CAD (coronary artery disease)   ? nonobstructive per notes  ? Chronic diastolic CHF (congestive heart failure) ([place])   ? CKD (chronic kidney disease), stage III ([place])   ? Complication of blood transfusion   ? "got the wrong blood type at [facility] in ~ 2015; no adverse reaction that we are aware of"/daughter, [name] (01/27/2016)  ? COPD (chronic obstructive pulmonary disease) ([place])   ? Depression   ? "light case"  ? DVT (deep venous thrombosis) ([place]) 01/2016  ? a. LLE DVT 01/2016 - switched from Eliquis to Coumadin.  ? Dyspnea   ? with some activity  ? Gastric stenosis   ? a. s/p stomach tube  ? GERD (gastroesophageal reflux disease)   ? GI bleed   ? Headache   ? History of 2019 novel coronavirus disease (COVID-19)   ? History of blood transfusion   ? "several" (01/27/2016)  ? History of stomach ulcers   ? Hyperlipidemia   ? Hypertension   ? Hypothyroidism   ? On home oxygen therapy   ? "2L; 9PM til 9AM" (06/27/2017)  ? PAD (peripheral artery disease) ([place])   ? a. prior gangrene, toe amputations, intervention.  ? PAF (paroxysmal atrial fibrillation) ([place])   ? Paraesophageal hernia   ? Perforated gastric ulcer ([place])   ? Pneumonia   ? "a few times" (06/27/2017)  ? Seasonal allergies   ? SIADH (syndrome of inappropriate ADH production) ([place])   ? [name] 01/10/2015  ? Small bowel obstruction ([place])   ? "I don't know how many" (01/11/2015)  ? Stroke [facility])   ? several- no residual  ? Type II  diabetes mellitus ([place])   ? "related to prednisone use  for > 20 yr; once predinose stopped; no more DM RX" (01/27/2016)  ? UTI (urinary tract infection) 02/08/2016  ? Ventral hernia with bowel obstruction   ? ? ? ? ?Objective: ?Physical Exam ?General: The patient is alert and oriented x3 in no acute distress. ? ?Dermatology: There is overall improvement noted to the ulcers of the right plantar forefoot amputation stump.  Please see attached photo.  No malodor.  Moderate serosanguineous drainage noted.  Overall the wounds appear very stable for the moment. ? ?Vascular: Established PAD.  Monitored closely by vein and vascular.  Balloon angioplasty with vein and vascular.  Lower extremity angiography 12/24/2020 ? ?VAS [place] ABI W/WO TBI 09/10/2020 ?Right: Resting right ankle-brachial index is within normal range using the  ?posterior tibial artery . No evidence of significant right lower extremity  ?arterial disease.  ? ?Left: Resting left ankle-brachial index indicates noncompressible left  ?lower extremity arteries. Unable to obtain left great toe index due to  ?bandage.  ? ?Neurological: Epicritic and protective threshold diminished bilaterally.  ? ?Musculoskeletal Exam: Status post TMA right foot.  Hallux valgus left with history of overlying ulcer and degenerative changes possible exposed  bone and joint capsule.  Second and third toe amputation left. ? ?Assessment: ?1. s/p transmetatarsal amputation right foot. DOS: 11/14/2019 ?2.  H/o 2nd toe amputation left ?3.  Chronic ulcers bilateral feet ?4.  Chronic osteomyelitis bilateral feet ? ?Plan of Care:  ?1. Patient was evaluated.   ?2.  Continue home nurse dressing changes 2 times weekly.  They have been applying Betadine and Aquacel to the wounds with dry dressings ?3.  We are going to continue to pursue conservative palliative care for the patient.  Patient does not want any additional surgery to the feet.  Both the patient and daughter understand current situation of  the chronic osteomyelitis and associated risks ?5.  Medically necessary excisional debridement of the wounds was performed today using a tissue nipper.  Excisional debridement of all necrotic nonviable tissue down to healthier bleeding viable tissue was performed with postdebridement measurement same as pre- ?6.  Silvadene cream provided to apply to the first MTP of the left foot. Continue.  Betadine ointment also provided to apply to the right foot wounds.  Continue.  ?7.  Return to clinic monthly for conservative routine management ? ?Edrick Kins, DPM ?[place] ? ?Dr. Edrick Kins, DPM  ?  ?[address]                                        ?[place], [place] [address]                ?Office [phone]  ?Fax [phone] ? ? ? ? ? ?

## 2021-07-28 DIAGNOSIS — E1151 Type 2 diabetes mellitus with diabetic peripheral angiopathy without gangrene: Secondary | ICD-10-CM | POA: Diagnosis not present

## 2021-07-28 DIAGNOSIS — E1142 Type 2 diabetes mellitus with diabetic polyneuropathy: Secondary | ICD-10-CM | POA: Diagnosis not present

## 2021-07-28 DIAGNOSIS — E1169 Type 2 diabetes mellitus with other specified complication: Secondary | ICD-10-CM | POA: Diagnosis not present

## 2021-07-28 DIAGNOSIS — E039 Hypothyroidism, unspecified: Secondary | ICD-10-CM | POA: Diagnosis not present

## 2021-07-28 DIAGNOSIS — M86172 Other acute osteomyelitis, left ankle and foot: Secondary | ICD-10-CM | POA: Diagnosis not present

## 2021-07-28 DIAGNOSIS — J9610 Chronic respiratory failure, unspecified whether with hypoxia or hypercapnia: Secondary | ICD-10-CM | POA: Diagnosis not present

## 2021-07-29 ENCOUNTER — Telehealth: Payer: Self-pay | Admitting: *Deleted

## 2021-07-29 NOTE — Telephone Encounter (Signed)
Received fax from [place] [phone] for Feeding supplies.  ?Placed in [place] folder to review and sign.  ?For: Syringe, Bag Feeding, Osmolite, Protein Porsource, Sponge and Paper Tape.  ? ?To be faxed back to Fax: [phone] once completed.  ?

## 2021-08-05 DIAGNOSIS — E039 Hypothyroidism, unspecified: Secondary | ICD-10-CM | POA: Diagnosis not present

## 2021-08-05 DIAGNOSIS — E1151 Type 2 diabetes mellitus with diabetic peripheral angiopathy without gangrene: Secondary | ICD-10-CM | POA: Diagnosis not present

## 2021-08-05 DIAGNOSIS — M86172 Other acute osteomyelitis, left ankle and foot: Secondary | ICD-10-CM | POA: Diagnosis not present

## 2021-08-05 DIAGNOSIS — E1169 Type 2 diabetes mellitus with other specified complication: Secondary | ICD-10-CM | POA: Diagnosis not present

## 2021-08-05 DIAGNOSIS — E1142 Type 2 diabetes mellitus with diabetic polyneuropathy: Secondary | ICD-10-CM | POA: Diagnosis not present

## 2021-08-05 DIAGNOSIS — J9610 Chronic respiratory failure, unspecified whether with hypoxia or hypercapnia: Secondary | ICD-10-CM | POA: Diagnosis not present

## 2021-08-06 IMAGING — XA IR GI TUBE CONTRAST INJECTION
1 series · 3 of 3 positions shown · non-contrast
Comparison: none

INDICATION: [AGE] with a GJ feeding tube and bleeding at insertion site.
Patient also complains of nausea when eating by mouth.

[Series 300: ir cm inj any colonic tube w/fluoro · 3 of 3 slices shown]
[im 1/3]
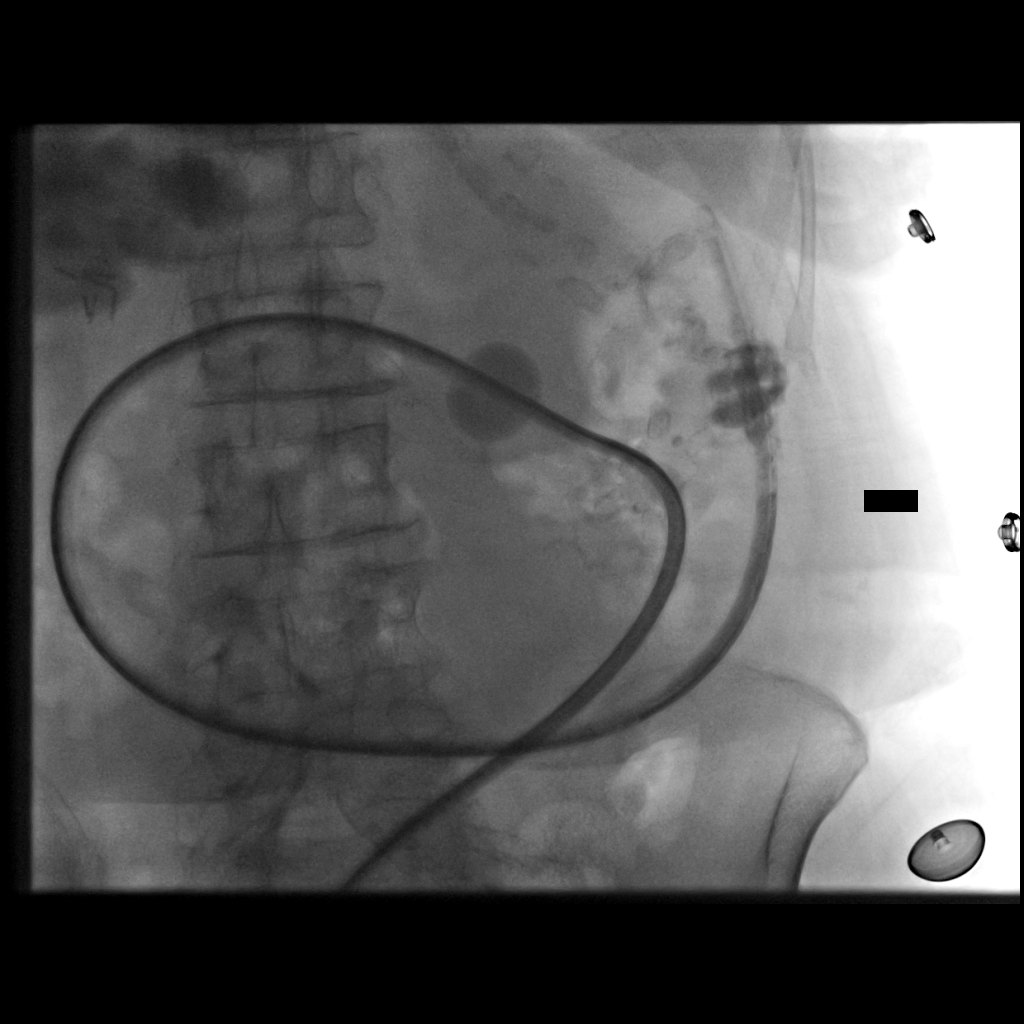
[im 2/3]
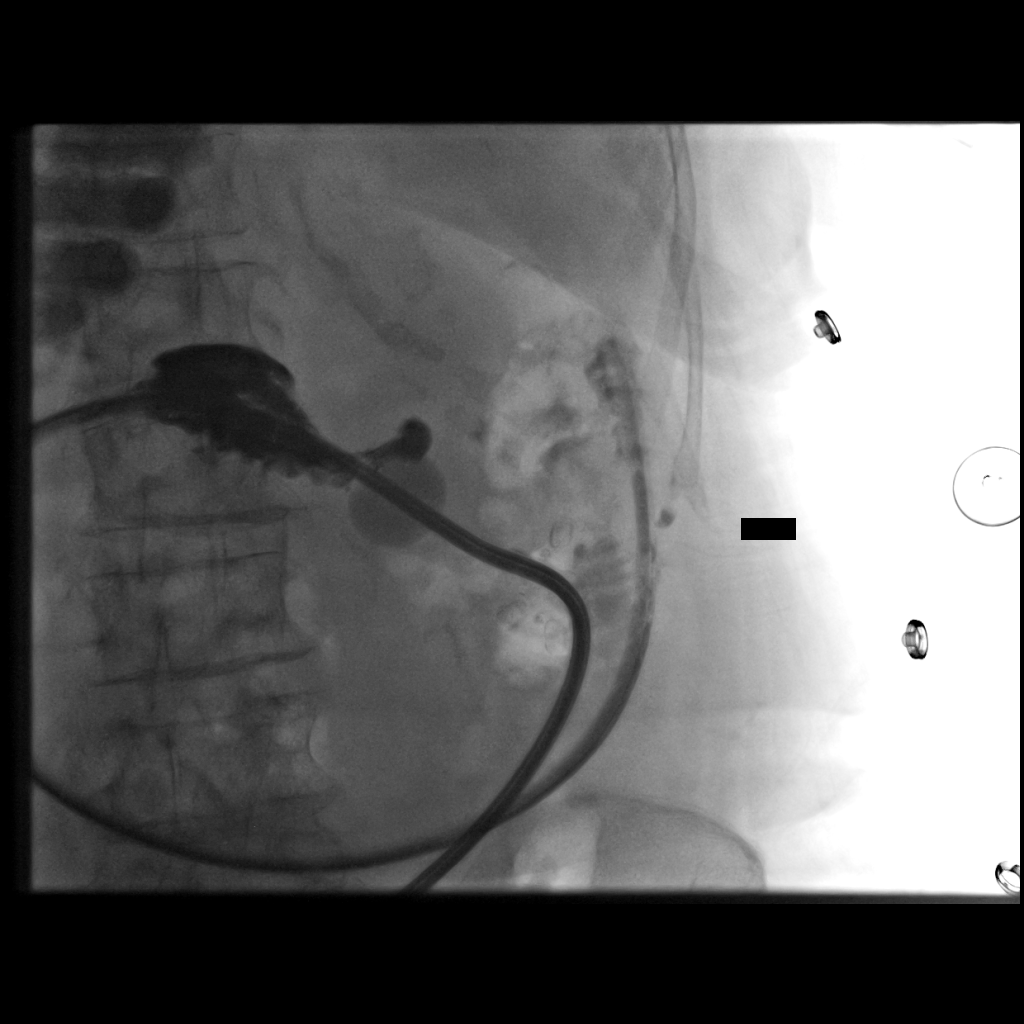
[im 3/3]
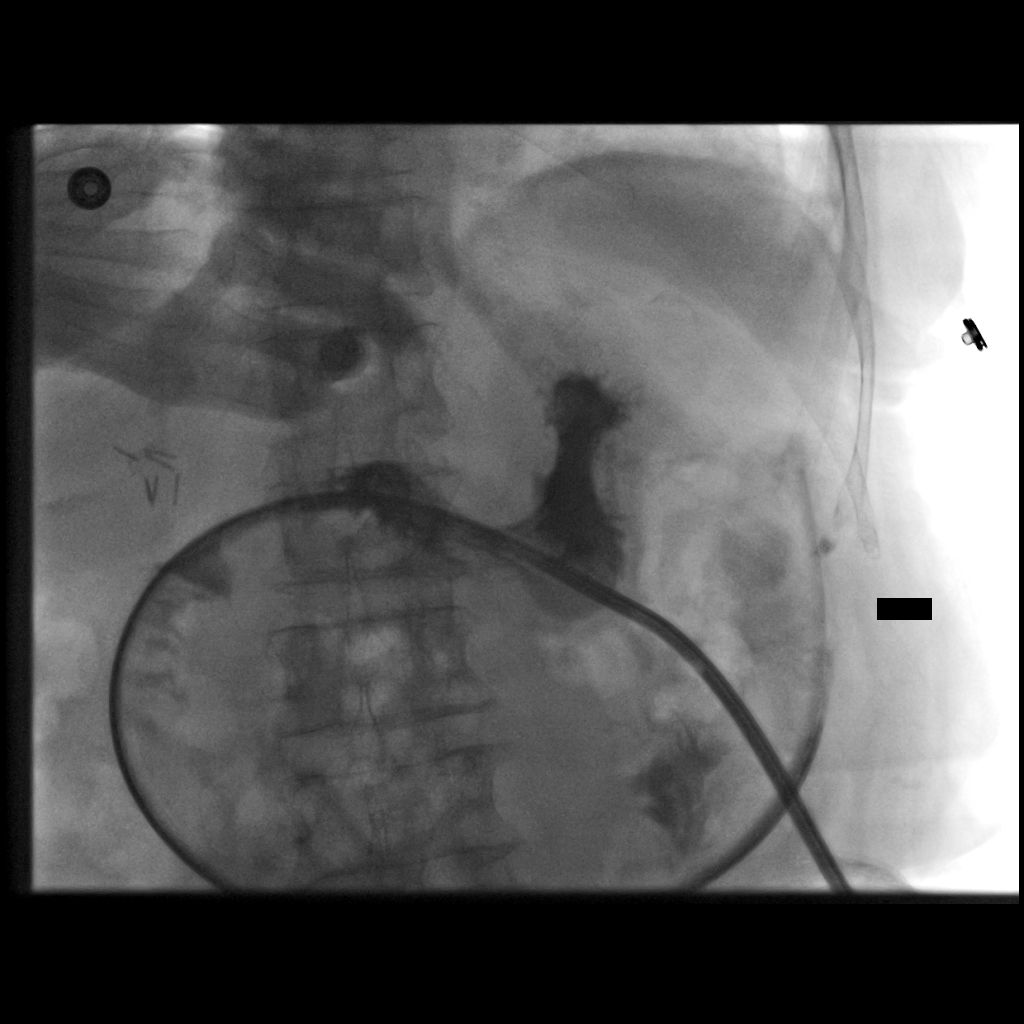

[3 of 3 positions shown; findings below may reference images not displayed]

EXAM:
INJECTION OF GJ FEEDING TUBE

MEDICATIONS:
None

ANESTHESIA/SEDATION:
None

CONTRAST:  5 mL-administered into the gastric/jejunal lumens.

FLUOROSCOPY TIME:  Fluoroscopy Time: 30 seconds, 4 mGy

COMPLICATIONS:
None immediate.

PROCEDURE:
Contrast was injected through the jejunal lumen and the gastric
lumen. Both lumens were flushed with saline.

It was noted that the retention balloon may be causing partial
obstruction of the gastric lumen. Therefore, 3 mL of fluid was
removed from the retention balloon.
FINDINGS: Stable position of the GJ feeding tube. Tip of the tube is in
jejunum. Gastric lumen fills the stomach.

3 mL of fluid was removed from the balloon in order to decrease the
risk of gastric lumen obstruction.
IMPRESSION: GJ feeding tube is well positioned.

Small amount of fluid was removed from the retention balloon in
order to decrease the risk of gastric lumen obstruction.

## 2021-08-11 ENCOUNTER — Other Ambulatory Visit: Payer: Self-pay | Admitting: *Deleted

## 2021-08-11 ENCOUNTER — Encounter: Payer: Self-pay | Admitting: *Deleted

## 2021-08-11 DIAGNOSIS — E1151 Type 2 diabetes mellitus with diabetic peripheral angiopathy without gangrene: Secondary | ICD-10-CM | POA: Diagnosis not present

## 2021-08-11 DIAGNOSIS — M86172 Other acute osteomyelitis, left ankle and foot: Secondary | ICD-10-CM | POA: Diagnosis not present

## 2021-08-11 DIAGNOSIS — E039 Hypothyroidism, unspecified: Secondary | ICD-10-CM | POA: Diagnosis not present

## 2021-08-11 DIAGNOSIS — J9610 Chronic respiratory failure, unspecified whether with hypoxia or hypercapnia: Secondary | ICD-10-CM | POA: Diagnosis not present

## 2021-08-11 DIAGNOSIS — E1142 Type 2 diabetes mellitus with diabetic polyneuropathy: Secondary | ICD-10-CM | POA: Diagnosis not present

## 2021-08-11 DIAGNOSIS — E1169 Type 2 diabetes mellitus with other specified complication: Secondary | ICD-10-CM | POA: Diagnosis not present

## 2021-08-11 IMAGING — XA IR GI TUBE CONTRAST INJECTION
1 series · 3 of 3 positions shown · non-contrast
Comparison: none

INDICATION: [AGE] with a GJ feeding tube. Patient complains of leakage
around the tube when she eats by mouth.

[Series 300: ir patient eval tech 0-60 mins · 3 of 3 slices shown]
[im 1/3]
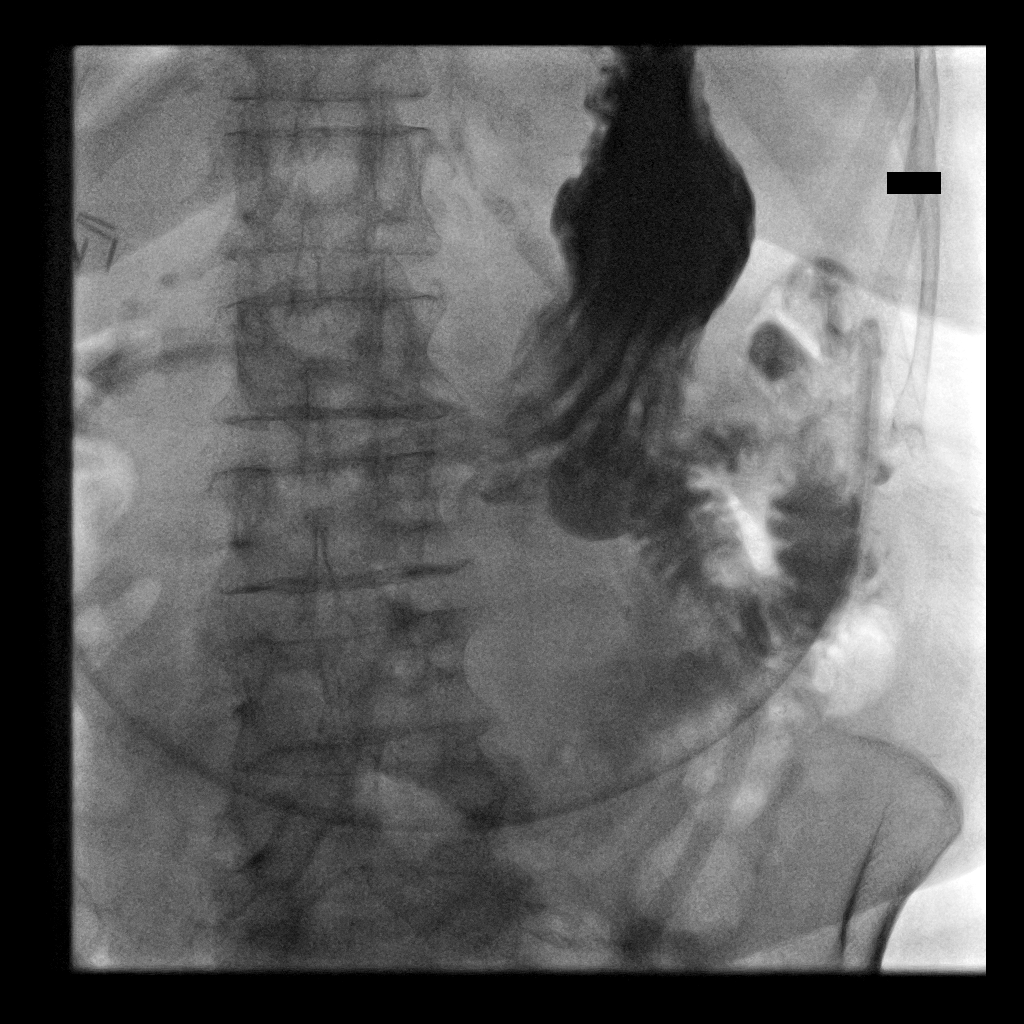
[im 2/3]
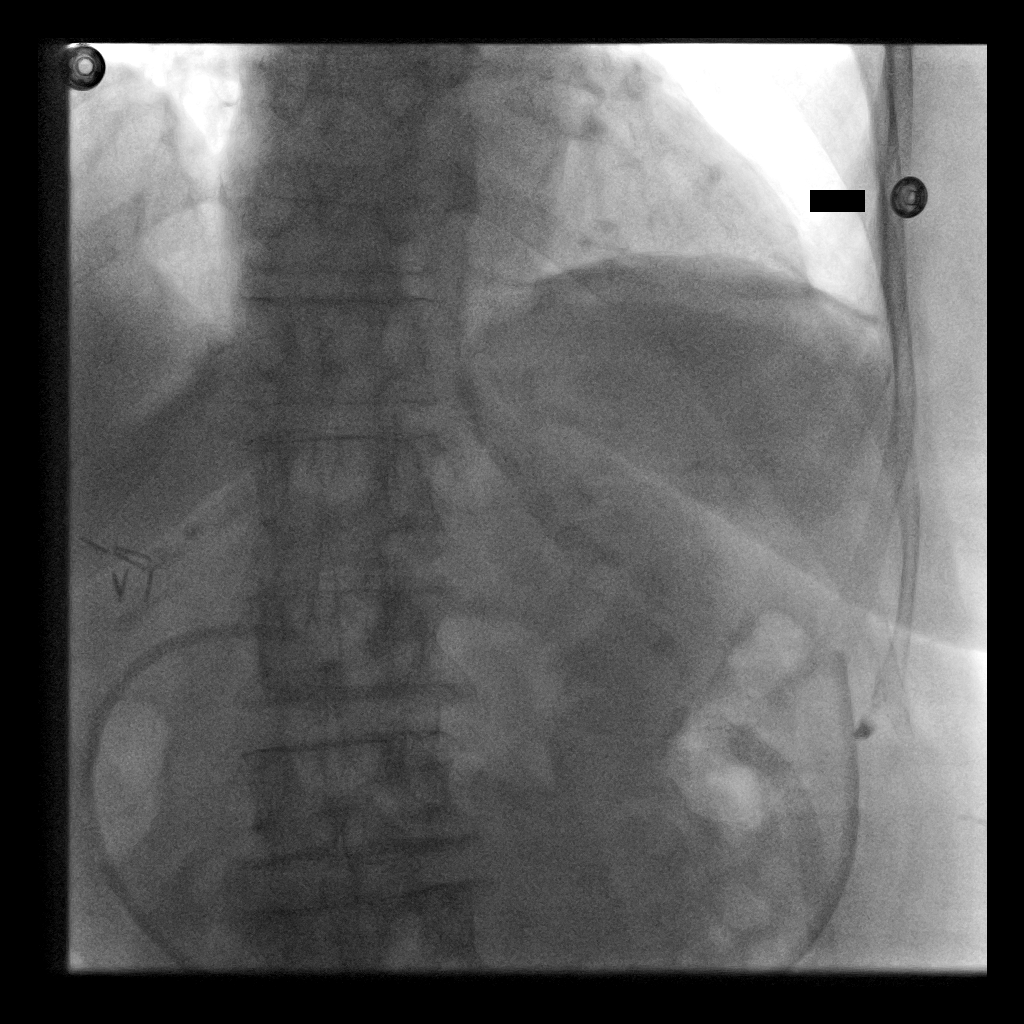
[im 3/3]
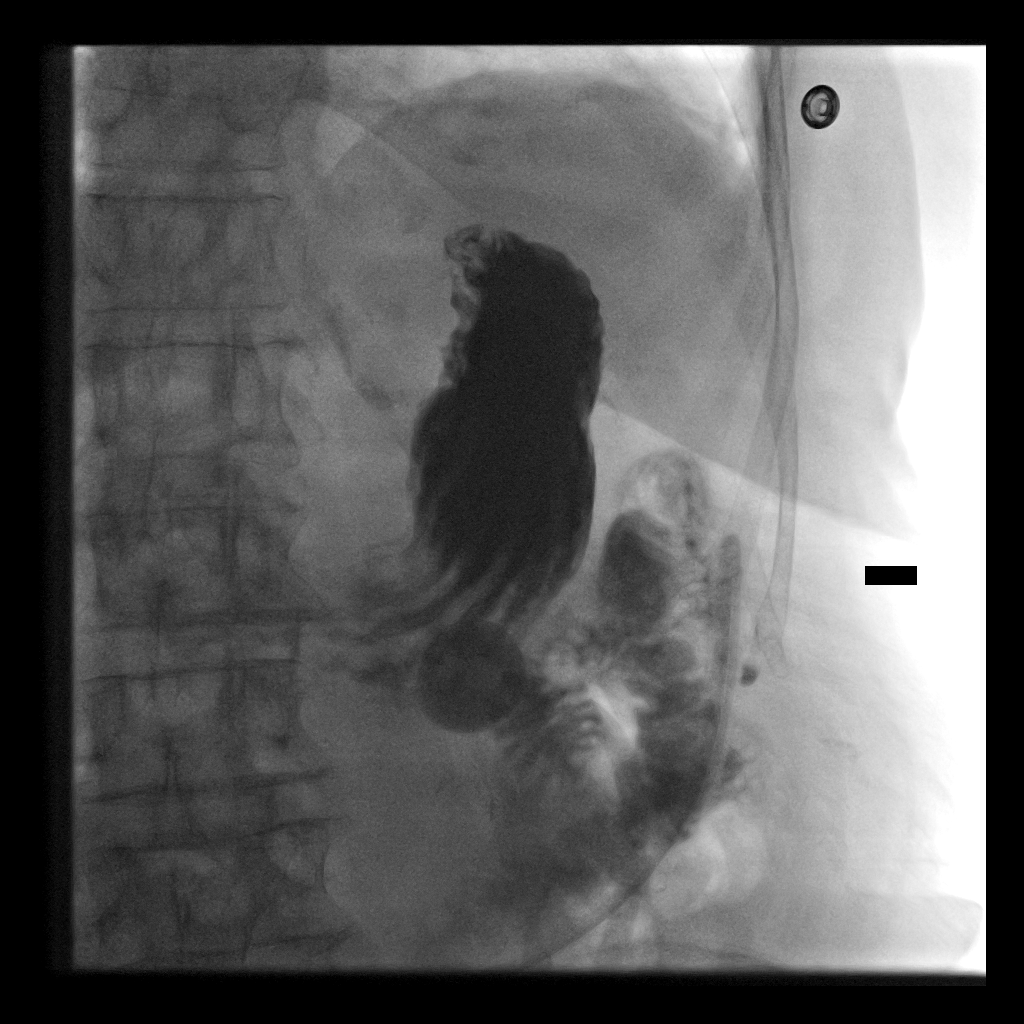

[3 of 3 positions shown; findings below may reference images not displayed]

EXAM:
INJECTION OF GJ FEEDING TUBE

MEDICATIONS:
None

ANESTHESIA/SEDATION:
None

CONTRAST:  20 mL Omnipaque 300-administered into the gastric lumen.

FLUOROSCOPY TIME:  Fluoroscopy Time: 1 minutes, 12 seconds, 8 mGy

COMPLICATIONS:
None immediate.

PROCEDURE:
We had the patient drink while she was sitting up to see if there
was any significant leakage from GJ feeding tube site. No
significant leakage identified.

The GJ tube feeding tube was injected with contrast under
fluoroscopy. The balloon was slightly repositioned and a small
amount of contrast was placed in the balloon in order to identify
with fluoroscopy. Both the G and J lumens were injected. Both lumens
were flushed with saline at the end of the procedure.
FINDINGS: GJ tube is appropriately positioned. The retention balloon has
approximately 7 mL of fluid in it. The balloon was pulled up against
the abdominal wall and appears to be appropriately position. Gastric
lumen is well positioned in the stomach. Contrast was pooling in the
gastric fundus region and may be related to patient's positioning or
gastroparesis. The J tube is well positioned in the small bowel.
IMPRESSION: GJ feeding tube is appropriately positioned.

Leaking from the GJ tube after the patient eats or drinks may be
related to delayed or poor gastric emptying.

## 2021-08-11 NOTE — Patient Outreach (Signed)
?[facility] Customer service manager [facility]) Care Management ?Telephonic RN Care Manager Note ? ? ?08/11/2021 ?Name:  Shannon [name] MRN:  [id number] DOB:  [birth year] ? ?Summary: ?Outgoing call placed to member, successful.  Denies any urgent concerns, encouraged to contact this care manager with questions. ? ? ?Subjective: ?Shannon [name] is an 88 y.o. year old female who is a primary patient of Sharon Seller, NP. The care management team was consulted for assistance with care management and/or care coordination needs.   ? ?Telephonic RN Care Manager completed Telephone Visit today. ? ?Objective:  ? ?Medications Reviewed Today   ? ? Reviewed by Kemper Durie, RN (Registered Nurse) on 08/11/21 at 1028  Med List Status: <None>  ? ?Medication Order Taking? Sig Documenting Provider Last Dose Status Informant  ?acetaminophen (TYLENOL) 500 MG tablet [id number] Yes Take 500 mg by mouth every 6 (six) hours as needed for moderate pain. [provider] Taking Active Child  ?amLODipine (NORVASC) 5 MG tablet [id number] Yes TAKE 1 TABLET BY MOUTH EVERY DAY Sharon Seller, NP Taking Active   ?aspirin 81 MG EC tablet [id number] Yes Take 1 tablet (81 mg total) by mouth daily. Swallow whole. Leonie Douglas, MD Taking Active Child  ?         ?Med Note Porfirio Mylar   Tue Feb 15, 2021 10:20 AM)    ?atorvastatin (LIPITOR) 10 MG tablet [id number] Yes TAKE 1 TABLET BY MOUTH EVERY DAY Sharon Seller, NP Taking Active   ?b complex vitamins tablet [id number] Yes Take 1 tablet by mouth daily.  [provider] Taking Active Child  ?cholecalciferol (VITAMIN D3) 25 MCG (1000 UNIT) tablet [id number] Yes Take 1,000 Units by mouth daily. [provider] Taking Active Child  ?cycloSPORINE (RESTASIS) 0.05 % ophthalmic emulsion [id number] Yes USE 1 DROP INTO BOTH EYES TWICE DAILY Kirt Boys, DO Taking Active Child  ?         ?Med Note Porfirio Mylar   Tue Feb 15, 2021 10:21 AM)    ?doxycycline (VIBRA-TABS) 100 MG tablet  [id number] Yes TAKE 1 TABLET BY MOUTH TWICE A DAY Logan Bores Larena Glassman, DPM Taking Active   ?DULoxetine (CYMBALTA) 30 MG capsule [id number] Yes TAKE 1 CAPSULE BY MOUTH EVERY DAY Janyth Contes Janene Harvey, NP Taking Active Child  ?         ?Med Note Porfirio Mylar   Tue Feb 15, 2021 10:21 AM)    ?ferrous sulfate 220 (44 Fe) MG/5ML solution [id number] Yes TAKE 7.4 MLS (325 MG TOTAL) BY MOUTH DAILY.  ?Patient taking differently: Place 220 mg into feeding tube daily.  ? Sharon Seller, NP Taking Active   ?         ?Med Note [facility], CARLOS A   Wed May 04, 2021  2:21 PM)    ?fluticasone (FLONASE) 50 MCG/ACT nasal spray [id number] Yes 2 sprays into each nostril daily. Sharon Seller, NP Taking Active Child  ?         ?Med Note Porfirio Mylar   Tue Feb 15, 2021 10:22 AM)    ?furosemide (LASIX) 40 MG tablet [id number] Yes TAKE 0.5-1 TABLETS (20-40 MG TOTAL) BY MOUTH SEE ADMIN INSTRUCTIONS. TAKE 20 MG DAILY, MAY INSTEAD TAKE 40 MG DAILY AS NEEDED FOR EDEMA Meriam Sprague, MD Taking Active Child  ?         ?Med Note Porfirio Mylar   Tue Feb 15, 2021 10:22 AM)    ?gabapentin (NEURONTIN)  300 MG capsule [id number] Yes TAKE 1 CAPSULE BY MOUTH TWICE A DAY Eubanks, Janene Harvey, NP Taking Active   ?gentamicin cream (GARAMYCIN) 0.1 % [id number] Yes Apply 1 application topically daily as needed (dry skin). Felecia Shelling, DPM Taking Active Child  ?hydrALAZINE (APRESOLINE) 50 MG tablet [id number] Yes Take 1 tablet (50 mg total) by mouth 3 (three) times daily. Meriam Sprague, MD Taking Active   ?Insulin Pen Needle (B-D UF III MINI PEN NEEDLES) 31G X 5 MM MISC [id number] Yes Use twice daily with giving insulin injections. Dx E11.9 Sharon Seller, NP Taking Active Child  ?latanoprost (XALATAN) 0.005 % ophthalmic solution [id number] Yes Place 1 drop into both eyes at bedtime. [provider] Taking Active Child  ?         ?Med Note Porfirio Mylar   Tue Feb 15, 2021 10:23 AM)    ?levothyroxine (SYNTHROID) 125  MCG tablet [id number] Yes TAKE 1 TABLET BY MOUTH EVERY DAY Janyth Contes Janene Harvey, NP Taking Active Child  ?         ?Med Note Porfirio Mylar   Tue Feb 15, 2021 10:11 AM)    ?liver oil-zinc oxide (DESITIN) 40 % ointment [id number] Yes Apply topically 2 (two) times daily. Regalado, Belkys A, MD Taking Active   ?loratadine (CLARITIN) 10 MG tablet [id number] Yes Take 10 mg by mouth daily. [provider] Taking Active Child  ?mometasone-formoterol (DULERA) 200-5 MCG/ACT AERO [id number] Yes INHALE TWO PUFFS BY MOUTH TWICE DAILY Janyth Contes Janene Harvey, NP Taking Active Child  ?         ?Med Note [facility], CARLOS A   Wed May 04, 2021  2:26 PM)    ?Multiple Vitamin (MULTIVITAMIN WITH MINERALS) TABS tablet [id number] Yes Take 1 tablet by mouth daily. [provider] Taking Active Child  ?Nutritional Supplements (FEEDING SUPPLEMENT, OSMOLITE 1.5 CAL,) LIQD [id number] Yes Place 711 mLs into feeding tube See admin instructions. Take 711 ml over a 12 hour period per day 9pm-9am at a rate of 65 [provider] Taking Active Child  ?nystatin cream (MYCOSTATIN) [id number] Yes APPLY TO AFFECTED AREA TWICE A DAY Janyth Contes, Janene Harvey, NP Taking Active   ?pantoprazole (PROTONIX) 40 MG tablet [id number] Yes Take 1 tablet (40 mg total) by mouth 2 (two) times daily. Sharon Seller, NP Taking Active Child  ?         ?Med Note Porfirio Mylar   Tue Feb 15, 2021 10:17 AM)    ?Polyethyl Glycol-Propyl Glycol (SYSTANE) 0.4-0.3 % GEL ophthalmic gel [id number] Yes Place 1 application into both eyes 2 (two) times daily as needed (moisture). [provider] Taking Active Child  ?Probiotic Product (EQL PROBIOTIC COLON SUPPORT PO) [id number] Yes Take 1 capsule by mouth daily. [provider] Taking Active Child  ?senna-docusate (SENOKOT-S) 8.6-50 MG tablet [id number] Yes Take 1 tablet by mouth at bedtime. [provider] Taking Active Child  ?Sodium Chloride-Sodium Bicarb (NETI POT SINUS [place] NA)  [id number] Yes Place 1 application into the nose at bedtime. [provider] Taking Active Child  ?         ?Med Note [facility], CARLOS A   Wed May 04, 2021  2:26 PM)    ?traMADol (ULTRAM) 50 MG tablet [id number] Yes Place 1 tablet (50 mg total) into feeding tube every 6 (six) hours as needed for severe pain. Lars Mage, PA-C Taking Active Child  ?Water For Irrigation, Sterile (FREE WATER) SOLN [id number] Yes Place 180  mLs into feeding tube 3 (three) times daily. [provider] Taking Active Child  ?White Petrolatum-Mineral Oil (SYSTANE NIGHTTIME) OINT [id number] Yes Place 1 application into both eyes at bedtime.  [provider] Taking Active Child  ? ?  ?  ? ?  ? ? ? ?SDOH:  (Social Determinants of Health) assessments and interventions performed:  ? ? ? ?Care Plan ? ?Review of patient past medical history, allergies, medications, health status, including review of consultants reports, laboratory and other test data, was performed as part of comprehensive evaluation for care management services.  ? ?Care Plan : General Plan of Care (Adult)  ?Updates made by Kemper Durie, RN since 08/11/2021 12:00 AM  ?  ? ?Problem: Difficulty managing chronic medical conditions (CHF, DM, HTN, PVD)   ?Priority: High  ?  ? ?Long-Range Goal: Member and family will report adequate management of chronic health conditions (CHF, DM, HTN, PVD)   ?Start Date: 02/21/2021  ?Expected End Date: 02/21/2022  ?This Visit's Progress: On track  ?Recent Progress: On track  ?Priority: High  ?Note:   ?Current Barriers:  ?Chronic Disease Management support and education needs related to CHF, HTN, DMII, and PVD ? ?RNCM Clinical Goal(s):  ?Patient will verbalize understanding of plan for management of CHF, HTN, DMII, and PVD as evidenced by member/family verbalizing stability in member's status ?take all medications exactly as prescribed and will call provider for medication related questions as evidenced by family  reporting adherence    ?attend all scheduled medical appointments: PCP and Podiatry as evidenced by record of attendance        ?continue to work with Medical illustrator and/or Social Worker to address care [employer]

## 2021-08-12 DIAGNOSIS — K5900 Constipation, unspecified: Secondary | ICD-10-CM | POA: Diagnosis not present

## 2021-08-12 DIAGNOSIS — E1151 Type 2 diabetes mellitus with diabetic peripheral angiopathy without gangrene: Secondary | ICD-10-CM | POA: Diagnosis not present

## 2021-08-12 DIAGNOSIS — E1122 Type 2 diabetes mellitus with diabetic chronic kidney disease: Secondary | ICD-10-CM | POA: Diagnosis not present

## 2021-08-12 DIAGNOSIS — K219 Gastro-esophageal reflux disease without esophagitis: Secondary | ICD-10-CM | POA: Diagnosis not present

## 2021-08-12 DIAGNOSIS — K285 Chronic or unspecified gastrojejunal ulcer with perforation: Secondary | ICD-10-CM | POA: Diagnosis not present

## 2021-08-12 DIAGNOSIS — G8929 Other chronic pain: Secondary | ICD-10-CM | POA: Diagnosis not present

## 2021-08-12 DIAGNOSIS — I4892 Unspecified atrial flutter: Secondary | ICD-10-CM | POA: Diagnosis not present

## 2021-08-12 DIAGNOSIS — J9610 Chronic respiratory failure, unspecified whether with hypoxia or hypercapnia: Secondary | ICD-10-CM | POA: Diagnosis not present

## 2021-08-12 DIAGNOSIS — J449 Chronic obstructive pulmonary disease, unspecified: Secondary | ICD-10-CM | POA: Diagnosis not present

## 2021-08-12 DIAGNOSIS — E039 Hypothyroidism, unspecified: Secondary | ICD-10-CM | POA: Diagnosis not present

## 2021-08-12 DIAGNOSIS — F325 Major depressive disorder, single episode, in full remission: Secondary | ICD-10-CM | POA: Diagnosis not present

## 2021-08-12 DIAGNOSIS — M86172 Other acute osteomyelitis, left ankle and foot: Secondary | ICD-10-CM | POA: Diagnosis not present

## 2021-08-12 DIAGNOSIS — K311 Adult hypertrophic pyloric stenosis: Secondary | ICD-10-CM | POA: Diagnosis not present

## 2021-08-12 DIAGNOSIS — I2510 Atherosclerotic heart disease of native coronary artery without angina pectoris: Secondary | ICD-10-CM | POA: Diagnosis not present

## 2021-08-12 DIAGNOSIS — E1142 Type 2 diabetes mellitus with diabetic polyneuropathy: Secondary | ICD-10-CM | POA: Diagnosis not present

## 2021-08-12 DIAGNOSIS — Z79891 Long term (current) use of opiate analgesic: Secondary | ICD-10-CM | POA: Diagnosis not present

## 2021-08-12 DIAGNOSIS — I130 Hypertensive heart and chronic kidney disease with heart failure and stage 1 through stage 4 chronic kidney disease, or unspecified chronic kidney disease: Secondary | ICD-10-CM | POA: Diagnosis not present

## 2021-08-12 DIAGNOSIS — I5033 Acute on chronic diastolic (congestive) heart failure: Secondary | ICD-10-CM | POA: Diagnosis not present

## 2021-08-12 DIAGNOSIS — K439 Ventral hernia without obstruction or gangrene: Secondary | ICD-10-CM | POA: Diagnosis not present

## 2021-08-12 DIAGNOSIS — N184 Chronic kidney disease, stage 4 (severe): Secondary | ICD-10-CM | POA: Diagnosis not present

## 2021-08-12 DIAGNOSIS — E785 Hyperlipidemia, unspecified: Secondary | ICD-10-CM | POA: Diagnosis not present

## 2021-08-12 DIAGNOSIS — E1169 Type 2 diabetes mellitus with other specified complication: Secondary | ICD-10-CM | POA: Diagnosis not present

## 2021-08-12 DIAGNOSIS — D631 Anemia in chronic kidney disease: Secondary | ICD-10-CM | POA: Diagnosis not present

## 2021-08-12 DIAGNOSIS — I4819 Other persistent atrial fibrillation: Secondary | ICD-10-CM | POA: Diagnosis not present

## 2021-08-12 DIAGNOSIS — M150 Primary generalized (osteo)arthritis: Secondary | ICD-10-CM | POA: Diagnosis not present

## 2021-08-15 ENCOUNTER — Other Ambulatory Visit: Payer: Self-pay | Admitting: Podiatry

## 2021-08-16 NOTE — Telephone Encounter (Signed)
Please advise 

## 2021-08-18 DIAGNOSIS — E039 Hypothyroidism, unspecified: Secondary | ICD-10-CM | POA: Diagnosis not present

## 2021-08-18 DIAGNOSIS — E1169 Type 2 diabetes mellitus with other specified complication: Secondary | ICD-10-CM | POA: Diagnosis not present

## 2021-08-18 DIAGNOSIS — J9610 Chronic respiratory failure, unspecified whether with hypoxia or hypercapnia: Secondary | ICD-10-CM | POA: Diagnosis not present

## 2021-08-18 DIAGNOSIS — M86172 Other acute osteomyelitis, left ankle and foot: Secondary | ICD-10-CM | POA: Diagnosis not present

## 2021-08-18 DIAGNOSIS — E1151 Type 2 diabetes mellitus with diabetic peripheral angiopathy without gangrene: Secondary | ICD-10-CM | POA: Diagnosis not present

## 2021-08-18 DIAGNOSIS — E1142 Type 2 diabetes mellitus with diabetic polyneuropathy: Secondary | ICD-10-CM | POA: Diagnosis not present

## 2021-08-25 DIAGNOSIS — E1169 Type 2 diabetes mellitus with other specified complication: Secondary | ICD-10-CM | POA: Diagnosis not present

## 2021-08-25 DIAGNOSIS — E039 Hypothyroidism, unspecified: Secondary | ICD-10-CM | POA: Diagnosis not present

## 2021-08-25 DIAGNOSIS — M86172 Other acute osteomyelitis, left ankle and foot: Secondary | ICD-10-CM | POA: Diagnosis not present

## 2021-08-25 DIAGNOSIS — E1151 Type 2 diabetes mellitus with diabetic peripheral angiopathy without gangrene: Secondary | ICD-10-CM | POA: Diagnosis not present

## 2021-08-25 DIAGNOSIS — J9610 Chronic respiratory failure, unspecified whether with hypoxia or hypercapnia: Secondary | ICD-10-CM | POA: Diagnosis not present

## 2021-08-25 DIAGNOSIS — E1142 Type 2 diabetes mellitus with diabetic polyneuropathy: Secondary | ICD-10-CM | POA: Diagnosis not present

## 2021-08-28 ENCOUNTER — Emergency Department (HOSPITAL_COMMUNITY): Payer: Medicare Other

## 2021-08-28 ENCOUNTER — Other Ambulatory Visit: Payer: Self-pay | Admitting: Nurse Practitioner

## 2021-08-28 ENCOUNTER — Observation Stay (HOSPITAL_COMMUNITY)
Admission: EM | Admit: 2021-08-28 | Discharge: 2021-08-30 | Disposition: A | Discharge status: Home / Self Care | Payer: Medicare Other | Attending: Emergency Medicine | Admitting: Emergency Medicine

## 2021-08-28 ENCOUNTER — Other Ambulatory Visit: Payer: Self-pay

## 2021-08-28 DIAGNOSIS — R231 Pallor: Secondary | ICD-10-CM | POA: Diagnosis not present

## 2021-08-28 DIAGNOSIS — I2510 Atherosclerotic heart disease of native coronary artery without angina pectoris: Secondary | ICD-10-CM | POA: Diagnosis not present

## 2021-08-28 DIAGNOSIS — Z79899 Other long term (current) drug therapy: Secondary | ICD-10-CM | POA: Diagnosis not present

## 2021-08-28 DIAGNOSIS — K429 Umbilical hernia without obstruction or gangrene: Secondary | ICD-10-CM | POA: Diagnosis not present

## 2021-08-28 DIAGNOSIS — Z66 Do not resuscitate: Secondary | ICD-10-CM | POA: Insufficient documentation

## 2021-08-28 DIAGNOSIS — Z7982 Long term (current) use of aspirin: Secondary | ICD-10-CM | POA: Diagnosis not present

## 2021-08-28 DIAGNOSIS — K9423 Gastrostomy malfunction: Secondary | ICD-10-CM | POA: Diagnosis not present

## 2021-08-28 DIAGNOSIS — I5032 Chronic diastolic (congestive) heart failure: Secondary | ICD-10-CM | POA: Insufficient documentation

## 2021-08-28 DIAGNOSIS — J449 Chronic obstructive pulmonary disease, unspecified: Secondary | ICD-10-CM | POA: Insufficient documentation

## 2021-08-28 DIAGNOSIS — R42 Dizziness and giddiness: Secondary | ICD-10-CM | POA: Diagnosis not present

## 2021-08-28 DIAGNOSIS — Z86718 Personal history of other venous thrombosis and embolism: Secondary | ICD-10-CM | POA: Diagnosis not present

## 2021-08-28 DIAGNOSIS — E1122 Type 2 diabetes mellitus with diabetic chronic kidney disease: Secondary | ICD-10-CM | POA: Diagnosis not present

## 2021-08-28 DIAGNOSIS — I10 Essential (primary) hypertension: Secondary | ICD-10-CM | POA: Diagnosis present

## 2021-08-28 DIAGNOSIS — Z789 Other specified health status: Secondary | ICD-10-CM

## 2021-08-28 DIAGNOSIS — N179 Acute kidney failure, unspecified: Secondary | ICD-10-CM | POA: Diagnosis present

## 2021-08-28 DIAGNOSIS — E039 Hypothyroidism, unspecified: Secondary | ICD-10-CM | POA: Diagnosis present

## 2021-08-28 DIAGNOSIS — Z794 Long term (current) use of insulin: Secondary | ICD-10-CM | POA: Insufficient documentation

## 2021-08-28 DIAGNOSIS — I700 Atherosclerosis of aorta: Secondary | ICD-10-CM | POA: Diagnosis not present

## 2021-08-28 DIAGNOSIS — Z8673 Personal history of transient ischemic attack (TIA), and cerebral infarction without residual deficits: Secondary | ICD-10-CM | POA: Diagnosis not present

## 2021-08-28 DIAGNOSIS — R9431 Abnormal electrocardiogram [ECG] [EKG]: Secondary | ICD-10-CM | POA: Diagnosis not present

## 2021-08-28 DIAGNOSIS — D638 Anemia in other chronic diseases classified elsewhere: Secondary | ICD-10-CM | POA: Diagnosis present

## 2021-08-28 DIAGNOSIS — K921 Melena: Secondary | ICD-10-CM

## 2021-08-28 DIAGNOSIS — I480 Paroxysmal atrial fibrillation: Secondary | ICD-10-CM | POA: Insufficient documentation

## 2021-08-28 DIAGNOSIS — Z87891 Personal history of nicotine dependence: Secondary | ICD-10-CM | POA: Insufficient documentation

## 2021-08-28 DIAGNOSIS — R0689 Other abnormalities of breathing: Secondary | ICD-10-CM | POA: Diagnosis not present

## 2021-08-28 DIAGNOSIS — L89302 Pressure ulcer of unspecified buttock, stage 2: Secondary | ICD-10-CM

## 2021-08-28 DIAGNOSIS — I739 Peripheral vascular disease, unspecified: Secondary | ICD-10-CM | POA: Diagnosis present

## 2021-08-28 DIAGNOSIS — I130 Hypertensive heart and chronic kidney disease with heart failure and stage 1 through stage 4 chronic kidney disease, or unspecified chronic kidney disease: Secondary | ICD-10-CM | POA: Diagnosis not present

## 2021-08-28 DIAGNOSIS — R1084 Generalized abdominal pain: Secondary | ICD-10-CM | POA: Diagnosis not present

## 2021-08-28 DIAGNOSIS — N1832 Chronic kidney disease, stage 3b: Secondary | ICD-10-CM | POA: Diagnosis not present

## 2021-08-28 DIAGNOSIS — R239 Unspecified skin changes: Secondary | ICD-10-CM | POA: Diagnosis not present

## 2021-08-28 LAB — CBC WITH DIFFERENTIAL/PLATELET
Abs Immature Granulocytes: 0.02 10*3/uL (ref 0.00–0.07)
Basophils Absolute: 0.1 10*3/uL (ref 0.0–0.1)
Basophils Relative: 2 %
Eosinophils Absolute: 0.2 10*3/uL (ref 0.0–0.5)
Eosinophils Relative: 4 %
HCT: 26.8 % — ABNORMAL LOW (ref 36.0–46.0)
Hemoglobin: 8.9 g/dL — ABNORMAL LOW (ref 12.0–15.0)
Immature Granulocytes: 0 %
Lymphocytes Relative: 21 %
Lymphs Abs: 1 10*3/uL (ref 0.7–4.0)
MCH: 32.1 pg (ref 26.0–34.0)
MCHC: 33.2 g/dL (ref 30.0–36.0)
MCV: 96.8 fL (ref 80.0–100.0)
Monocytes Absolute: 0.9 10*3/uL (ref 0.1–1.0)
Monocytes Relative: 18 %
Neutro Abs: 2.6 10*3/uL (ref 1.7–7.7)
Neutrophils Relative %: 55 %
Platelets: 208 10*3/uL (ref 150–400)
RBC: 2.77 MIL/uL — ABNORMAL LOW (ref 3.87–5.11)
RDW: 16.1 % — ABNORMAL HIGH (ref 11.5–15.5)
WBC: 4.7 10*3/uL (ref 4.0–10.5)
nRBC: 0 % (ref 0.0–0.2)

## 2021-08-28 LAB — COMPREHENSIVE METABOLIC PANEL
ALT: 19 U/L (ref 0–44)
AST: 42 U/L — ABNORMAL HIGH (ref 15–41)
Albumin: 3.2 g/dL — ABNORMAL LOW (ref 3.5–5.0)
Alkaline Phosphatase: 77 U/L (ref 38–126)
Anion gap: 8 (ref 5–15)
BUN: 64 mg/dL — ABNORMAL HIGH (ref 8–23)
CO2: 23 mmol/L (ref 22–32)
Calcium: 9.5 mg/dL (ref 8.9–10.3)
Chloride: 110 mmol/L (ref 98–111)
Creatinine, Ser: 2.04 mg/dL — ABNORMAL HIGH (ref 0.44–1.00)
GFR, Estimated: 22 mL/min — ABNORMAL LOW (ref >60)
Glucose, Bld: 109 mg/dL — ABNORMAL HIGH (ref 70–99)
Potassium: 4.1 mmol/L (ref 3.5–5.1)
Sodium: 141 mmol/L (ref 135–145)
Total Bilirubin: 0.6 mg/dL (ref 0.3–1.2)
Total Protein: 7.1 g/dL (ref 6.5–8.1)

## 2021-08-28 LAB — LIPASE, BLOOD: Lipase: 21 U/L (ref 11–51)

## 2021-08-28 LAB — CBG MONITORING, ED: Glucose-Capillary: 107 mg/dL — ABNORMAL HIGH (ref 70–99)

## 2021-08-28 MED ORDER — LACTATED RINGERS IV BOLUS
500.0000 mL | Freq: Once | INTRAVENOUS | Status: AC
  Administered 2021-08-28: 500 mL via INTRAVENOUS

## 2021-08-28 MED ORDER — PANCRELIPASE (LIP-PROT-AMYL) 10440-39150 UNITS PO TABS
20880.0000 [IU] | ORAL_TABLET | Freq: Once | ORAL | Status: DC
  Filled 2021-08-28: qty 2

## 2021-08-28 MED ORDER — SODIUM BICARBONATE 650 MG PO TABS: 650.0000 mg | ORAL_TABLET | Freq: Once | ORAL | Status: DC

## 2021-08-28 NOTE — ED Triage Notes (Signed)
Pt bib gcems from home for abdominal pain starting today. Pt has feeding tube that was displaced on Friday. Pt was going to wait until after the weekend to be seen for it but the pain worsened tonight. Mentation at baseline. 100 mcg  fent and 180 NS given PTA.   BP 152/62, HR 88, Spo2 100%

## 2021-08-28 NOTE — ED Provider Notes (Signed)
[place] EMERGENCY DEPARTMENT Provider Note   CSN: [id number] Arrival date & time: 08/28/21  2142     History  Chief Complaint  Patient presents with   Abdominal Pain    Shannon [name] is a 88 y.o. female with past medical history of uncontrolled type 2 diabetes, CHF, COPD, UGI bleed, CKD 3 and pyloric stenosis status post GJ tube placement in 2017 presenting today with abdominal pain.  She says that this has been going on since Friday when her "feeding tube burst" while her daughter was trying to flush it.  Has not had any feedings since then, reports that she usually has a feeding every night.  Says that she is feeling very dehydrated and malnourished.  Denies fever, chills or changes in bowel movements.  Has not been nauseated and says that she still takes some food and has not vomited.  Specifically, she reports eating Jell-O.   Also concerned about a recurring pressure ulcer on her buttocks. Lives at home with daughter.    Abdominal Pain Abdominal Pain     Home Medications Prior to Admission medications   Medication Sig Start Date End Date Taking? Authorizing Provider  acetaminophen (TYLENOL) 500 MG tablet Take 500 mg by mouth every 6 (six) hours as needed for moderate pain.    [provider]  amLODipine (NORVASC) 5 MG tablet TAKE 1 TABLET BY MOUTH EVERY DAY 04/20/21   [name], NP  aspirin 81 MG EC tablet Take 1 tablet (81 mg total) by mouth daily. Swallow whole. 10/20/20 10/20/21  Cherre Robins, MD  atorvastatin (LIPITOR) 10 MG tablet TAKE 1 TABLET BY MOUTH EVERY DAY 07/06/21   [name], NP  b complex vitamins tablet Take 1 tablet by mouth daily.     [provider]  cholecalciferol (VITAMIN D3) 25 MCG (1000 UNIT) tablet Take 1,000 Units by mouth daily.    [provider]  cycloSPORINE (RESTASIS) 0.05 % ophthalmic emulsion USE 1 DROP INTO BOTH EYES TWICE DAILY 01/29/17   Gildardo Cranker, DO  doxycycline  (VIBRA-TABS) 100 MG tablet TAKE 1 TABLET BY MOUTH TWICE A DAY 08/17/21   Edrick Kins, DPM  DULoxetine (CYMBALTA) 30 MG capsule TAKE 1 CAPSULE BY MOUTH EVERY DAY 01/31/21   [name], NP  ferrous sulfate 220 (44 Fe) MG/5ML solution TAKE 7.4 MLS (325 MG TOTAL) BY MOUTH DAILY. Patient taking differently: Place 220 mg into feeding tube daily. 12/16/20   [name], NP  fluticasone (FLONASE) 50 MCG/ACT nasal spray 2 sprays into each nostril daily. 12/09/20   [name], NP  furosemide (LASIX) 40 MG tablet TAKE 0.5-1 TABLETS (20-40 MG TOTAL) BY MOUTH SEE ADMIN INSTRUCTIONS. TAKE 20 MG DAILY, MAY INSTEAD TAKE 40 MG DAILY AS NEEDED FOR EDEMA 08/04/20   Freada Bergeron, MD  gabapentin (NEURONTIN) 300 MG capsule TAKE 1 CAPSULE BY MOUTH TWICE A DAY 06/29/21   [name], NP  gentamicin cream (GARAMYCIN) 0.1 % Apply 1 application topically daily as needed (dry skin). 10/01/19   Edrick Kins, DPM  hydrALAZINE (APRESOLINE) 50 MG tablet Take 1 tablet (50 mg total) by mouth 3 (three) times daily. 07/18/21   Freada Bergeron, MD  Insulin Pen Needle (B-D UF III MINI PEN NEEDLES) 31G X 5 MM MISC Use twice daily with giving insulin injections. Dx E11.9 07/17/18   [name], NP  latanoprost (XALATAN) 0.005 % ophthalmic solution Place 1 drop into both eyes  at bedtime.    [provider]  levothyroxine (SYNTHROID) 125 MCG tablet TAKE 1 TABLET BY MOUTH EVERY DAY 01/31/21   [name], NP  liver oil-zinc oxide (DESITIN) 40 % ointment Apply topically 2 (two) times daily. 02/16/21   Regalado, Belkys A, MD  loratadine (CLARITIN) 10 MG tablet Take 10 mg by mouth daily.    [provider]  mometasone-formoterol (DULERA) 200-5 MCG/ACT AERO INHALE TWO PUFFS BY MOUTH TWICE DAILY 10/05/20   [name], NP  Multiple Vitamin (MULTIVITAMIN WITH MINERALS) TABS tablet Take 1 tablet by mouth daily.    [provider]  Nutritional Supplements (FEEDING  SUPPLEMENT, OSMOLITE 1.5 CAL,) LIQD Place 711 mLs into feeding tube See admin instructions. Take 711 ml over a 12 hour period per day 9pm-9am at a rate of 65    [provider]  nystatin cream (MYCOSTATIN) APPLY TO AFFECTED AREA TWICE A DAY 05/30/21   [name], NP  pantoprazole (PROTONIX) 40 MG tablet Take 1 tablet (40 mg total) by mouth 2 (two) times daily. 10/15/20   [name], NP  Polyethyl Glycol-Propyl Glycol (SYSTANE) 0.4-0.3 % GEL ophthalmic gel Place 1 application into both eyes 2 (two) times daily as needed (moisture).    [provider]  Probiotic Product (EQL PROBIOTIC COLON SUPPORT PO) Take 1 capsule by mouth daily.    [provider]  senna-docusate (SENOKOT-S) 8.6-50 MG tablet Take 1 tablet by mouth at bedtime.    [provider]  Sodium Chloride-Sodium Bicarb (NETI POT SINUS [place] NA) Place 1 application into the nose at bedtime.    [provider]  traMADol (ULTRAM) 50 MG tablet Place 1 tablet (50 mg total) into feeding tube every 6 (six) hours as needed for severe pain. 10/22/20   Ulyses Amor, PA-C  Water For Irrigation, Sterile (FREE WATER) SOLN Place 180 mLs into feeding tube 3 (three) times daily.    [provider]  White Petrolatum-Mineral Oil (SYSTANE NIGHTTIME) OINT Place 1 application into both eyes at bedtime.     [provider]      Allergies    Penicillins    Review of Systems   Review of Systems  Gastrointestinal:  Positive for abdominal pain.   Physical Exam Updated Vital Signs BP 138/67   Pulse 76   Temp (!) 97.5 F (36.4 C) (Oral)   Resp (!) 21   Ht '5\' 6"'$  (1.676 m)   Wt 55.8 kg   SpO2 100%   BMI 19.86 kg/m  Physical Exam Vitals and nursing note reviewed.  Constitutional:      General: She is not in acute distress.    Appearance: Normal appearance. She is not ill-appearing.  HENT:     Head: Normocephalic and atraumatic.  Eyes:     General: No scleral icterus.     Conjunctiva/sclera: Conjunctivae normal.  Cardiovascular:     Rate and Rhythm: Normal rate and regular rhythm.  Pulmonary:     Effort: Pulmonary effort is normal. No respiratory distress.  Abdominal:     General: Abdomen is flat.     Palpations: Abdomen is soft.     Tenderness: There is abdominal tenderness in the right upper quadrant and periumbilical area.     Hernia: A hernia is present. Hernia is present in the ventral area.     Comments: Large right-sided ventral hernia without signs of incarceration/strangulation.  GJ tube in place on left abdomen, no signs of infection.  Tube  appears clogged.  Right upper quadrant and periumbilical tenderness  Genitourinary:    Comments: Exam of patient's buttocks performed with RN.  Minor skin breakdown in the gluteal cleft and bilateral superior buttocks bilaterally. No ulceration or drainage. Skin:    General: Skin is warm and dry.     Findings: No rash.  Neurological:     Mental Status: She is alert.  Psychiatric:        Mood and Affect: Mood normal.    ED Results / Procedures / Treatments   Labs (all labs ordered are listed, but only abnormal results are displayed) Labs Reviewed  COMPREHENSIVE METABOLIC PANEL - Abnormal; Notable for the following components:      Result Value   Glucose, Bld 109 (*)    BUN 64 (*)    Creatinine, Ser 2.04 (*)    Albumin 3.2 (*)    AST 42 (*)    GFR, Estimated 22 (*)    All other components within normal limits  CBC WITH DIFFERENTIAL/PLATELET - Abnormal; Notable for the following components:   RBC 2.77 (*)    Hemoglobin 8.9 (*)    HCT 26.8 (*)    RDW 16.1 (*)    All other components within normal limits  URINALYSIS, ROUTINE W REFLEX MICROSCOPIC - Abnormal; Notable for the following components:   APPearance HAZY (*)    All other components within normal limits  CBG MONITORING, ED - Abnormal; Notable for the following components:   Glucose-Capillary 107 (*)    All other components within normal  limits  LIPASE, BLOOD    EKG EKG Interpretation  Date/Time:  Sunday Aug 28 2021 21:48:46 EDT Ventricular Rate:  75 PR Interval:  164 QRS Duration: 73 QT Interval:  398 QTC Calculation: 445 R Axis:   62 Text Interpretation: Sinus rhythm Probable left atrial enlargement Consider anterior infarct Baseline wander in lead(s) I III aVL similar to nov 2022 Confirmed by Sherwood Gambler [phone]) on 08/28/2021 10:13:30 PM  Radiology CT ABDOMEN PELVIS WO CONTRAST  Result Date: 08/28/2021 CLINICAL DATA:  Abdominal pain, acute, nonlocalized EXAM: CT ABDOMEN AND PELVIS WITHOUT CONTRAST TECHNIQUE: Multidetector CT imaging of the abdomen and pelvis was performed following the standard protocol without IV contrast. RADIATION DOSE REDUCTION: This exam was performed according to the departmental dose-optimization program which includes automated exposure control, adjustment of the mA and/or kV according to patient size and/or use of iterative reconstruction technique. COMPARISON:  02/14/2021 FINDINGS: Lower chest: Heavily calcified aorta and visualized coronary arteries. Heart is mildly enlarged. No acute abnormality. Hepatobiliary: Scattered hypodensities in the liver are stable since prior study, likely cysts. Prior cholecystectomy. No biliary ductal dilatation. Pancreas: No focal abnormality or ductal dilatation. Spleen: No focal abnormality.  Normal size. Adrenals/Urinary Tract: No adrenal abnormality. No focal renal abnormality. No stones or hydronephrosis. Urinary bladder is unremarkable. Stomach/Bowel: Paraumbilical ventral hernia noted containing transverse colon. No evidence of bowel obstruction. Gastrojejunostomy tube in place. The retention balloon is in the stomach. The tip is in the proximal jejunum. Stomach and small bowel decompressed, unremarkable. Scattered colonic diverticula. Vascular/Lymphatic: Heavily calcified aorta and iliac vessels. No evidence of aneurysm or adenopathy. Reproductive: Uterus  and adnexa unremarkable.  No mass. Other: No free fluid or free air. Adjacent to the paraumbilical hernia containing the transverse colon, there is a left paraumbilical hernia containing fat. Musculoskeletal: No acute bony abnormality. Degenerative disc disease throughout the lumbar spine. Mild compression fracture at T11 is stable since prior study. IMPRESSION: No acute findings in the  abdomen or pelvis. Paraumbilical hernia containing transverse colon. No bowel obstruction. Adjacent separate periumbilical hernia containing fat. Gastrojejunostomy tip is in the jejunum. Aortoiliac atherosclerosis. Electronically Signed   By: Rolm Baptise M.D.   On: 08/28/2021 23:01    Procedures Procedures   Medications Ordered in ED Medications  lipase/protease/amylase) (VIOKACE) tablets 20,880 Units (has no administration in time range)    And  sodium bicarbonate tablet 650 mg (has no administration in time range)  lactated ringers bolus 500 mL (0 mLs Intravenous Stopped 08/28/21 2327)    ED Course/ Medical Decision Making/ A&P Clinical Course as of 08/29/21 0328  Mon Aug 29, 2021  0104 Myself and nursing staff attempted to contact [facility] for appropriately sized syringe for patient's tube.  We do not have any appropriately sized syringes and patient reports that her daughter always has to bring hers from home because [facility] does not have the items [MR]    Clinical Course User Index [MR] Aaleah Hirsch, Cecilio Asper, PA-C                           Medical Decision Making Amount and/or Complexity of Data Reviewed Radiology: ordered.  Risk OTC drugs. Prescription drug management. Decision regarding hospitalization.   This patient presents to the ED for concern of abdominal pain in the setting of problem with PEG tube.  Tube first while daughter was trying to flush it on Friday.  Michela Pitcher it was too late to call radiology as she usually does.   This is not an exhaustive differential.    Past Medical History /  Co-morbidities / Social History: J tube placement, uncontrolled diabetes, CKD 3   Additional history: Additional history obtained from chart review.  Patient has had frequent visits with IR for tube malfunctions.  Most recent's are: 10/05/2020, 7/14, 7/19, 11/25, 03/23/21, 05/31/2021. Per chart review, a 38F GJ tube was placed on 2/28.  I also spoke with the patient's daughter.  She reports that she was trying to flush the patient's tube after feeding on Friday when it burst.  She says that they have been having recurrent problems with tube clogging.  It was too late for her to get in touch with radiology who usually replaces the tube.  The hope was that she would make it until Tuesday however she is feeling dehydrated and having increased pain.   Physical Exam: Physical exam performed. The pertinent findings include: GJ tube appears to be in place however with dry feedings in the tube.  No purulent fluid or drainage from the site.  Lab Tests: I ordered, and personally interpreted labs.  The pertinent results include:  -Hgb: 8.9, baseline 9.3 -Cr. 2.04, BUN 64, GFR 22 (worse from baseline of 1.4 range)   Imaging Studies: I ordered imaging studies including CT abdomen pelvis. I independently visualized and interpreted imaging which showed nothing acute, GJ tube in jejunum. I agree with the radiologist interpretation.  At this point, G-tube placement is confirmed.  We will try to unclog the tube so that it can be successfully used for outpatient follow-up this week.  Medications: Patient says her pain has eased off since being given fentanyl with EMS.  Says she does not need pain medication at this time.    Consultations Obtained: I requested consultation with Dr. Denna Haggard with interventional radiology.  He says he will review the patient's chart and come see her in the late morning.  Technicians are not in the emergency department  and he is unsure of the inventory on site.    Disposition:  88 year old female with history of pyloric stenosis requiring GJ tube placement presenting today with balloon rupture.  CT scan confirmed placement.  Medical staff attempted to unclog tube however there are no syringes at [facility] that fit her current tube.  Per chart review, a 69F GJ tube was placed on 2/28.  After consideration of the diagnostic results and the patients response to treatment, I feel the patient meets admission criteria for either AKI or progressing CKD.  IR will see her in the morning.  Dr. Marlowe Sax accepting  I discussed this case with my attending physician Dr. Regenia Skeeter & Dr. Leonette Monarch who cosigned this note including patient's presenting symptoms, physical exam, and planned diagnostics and interventions. Attending physician stated agreement with plan or made changes to plan which were implemented.     Final Clinical Impression(s) / ED Diagnoses Final diagnoses:  Encounter for gastrojejunal ([place]) tube placement  AKI (acute kidney injury) [facility])    Rx / DC Orders Admit to [employer], PA-C 08/29/21 0329    Shannon Blank, MD 08/29/21 [phone]

## 2021-08-29 ENCOUNTER — Observation Stay (HOSPITAL_COMMUNITY): Payer: Medicare Other

## 2021-08-29 DIAGNOSIS — K9423 Gastrostomy malfunction: Secondary | ICD-10-CM | POA: Diagnosis not present

## 2021-08-29 DIAGNOSIS — N179 Acute kidney failure, unspecified: Secondary | ICD-10-CM | POA: Diagnosis not present

## 2021-08-29 DIAGNOSIS — K9413 Enterostomy malfunction: Secondary | ICD-10-CM | POA: Diagnosis not present

## 2021-08-29 DIAGNOSIS — R0602 Shortness of breath: Secondary | ICD-10-CM | POA: Diagnosis not present

## 2021-08-29 DIAGNOSIS — L89302 Pressure ulcer of unspecified buttock, stage 2: Secondary | ICD-10-CM

## 2021-08-29 HISTORY — PX: IR GJ TUBE CHANGE: IMG1440

## 2021-08-29 LAB — URINALYSIS, ROUTINE W REFLEX MICROSCOPIC
Bilirubin Urine: NEGATIVE
Glucose, UA: NEGATIVE mg/dL
Hgb urine dipstick: NEGATIVE
Ketones, ur: NEGATIVE mg/dL
Leukocytes,Ua: NEGATIVE
Nitrite: NEGATIVE
Protein, ur: NEGATIVE mg/dL
Specific Gravity, Urine: 1.01 (ref 1.005–1.030)
pH: 7 (ref 5.0–8.0)

## 2021-08-29 LAB — CBC
HCT: 27.1 % — ABNORMAL LOW (ref 36.0–46.0)
Hemoglobin: 8.9 g/dL — ABNORMAL LOW (ref 12.0–15.0)
MCH: 31.9 pg (ref 26.0–34.0)
MCHC: 32.8 g/dL (ref 30.0–36.0)
MCV: 97.1 fL (ref 80.0–100.0)
Platelets: 198 10*3/uL (ref 150–400)
RBC: 2.79 MIL/uL — ABNORMAL LOW (ref 3.87–5.11)
RDW: 16 % — ABNORMAL HIGH (ref 11.5–15.5)
WBC: 4.4 10*3/uL (ref 4.0–10.5)
nRBC: 0 % (ref 0.0–0.2)

## 2021-08-29 LAB — BASIC METABOLIC PANEL
Anion gap: 5 (ref 5–15)
BUN: 56 mg/dL — ABNORMAL HIGH (ref 8–23)
CO2: 25 mmol/L (ref 22–32)
Calcium: 9.4 mg/dL (ref 8.9–10.3)
Chloride: 108 mmol/L (ref 98–111)
Creatinine, Ser: 1.69 mg/dL — ABNORMAL HIGH (ref 0.44–1.00)
GFR, Estimated: 27 mL/min — ABNORMAL LOW (ref >60)
Glucose, Bld: 104 mg/dL — ABNORMAL HIGH (ref 70–99)
Potassium: 3.7 mmol/L (ref 3.5–5.1)
Sodium: 138 mmol/L (ref 135–145)

## 2021-08-29 LAB — GLUCOSE, CAPILLARY
Glucose-Capillary: 76 mg/dL (ref 70–99)
Glucose-Capillary: 70 mg/dL (ref 70–99)

## 2021-08-29 LAB — BRAIN NATRIURETIC PEPTIDE: B Natriuretic Peptide: 195.8 pg/mL — ABNORMAL HIGH (ref 0.0–100.0)

## 2021-08-29 MED ORDER — FREE WATER
180.0000 mL | Freq: Three times a day (TID) | Status: DC
  Administered 2021-08-29 – 2021-08-30 (×3): 180 mL

## 2021-08-29 MED ORDER — POLYETHYL GLYCOL-PROPYL GLYCOL 0.4-0.3 % OP GEL: 1.0000 "application " | Freq: Two times a day (BID) | OPHTHALMIC | Status: DC | PRN

## 2021-08-29 MED ORDER — IOHEXOL 300 MG/ML  SOLN
100.0000 mL | Freq: Once | INTRAMUSCULAR | Status: AC | PRN
  Administered 2021-08-29: 5 mL

## 2021-08-29 MED ORDER — DULOXETINE HCL 30 MG PO CPEP
30.0000 mg | ORAL_CAPSULE | Freq: Every day | ORAL | Status: DC
  Administered 2021-08-29: 30 mg via ORAL
  Filled 2021-08-29 (×2): qty 1

## 2021-08-29 MED ORDER — AMLODIPINE BESYLATE 5 MG PO TABS
5.0000 mg | ORAL_TABLET | Freq: Every day | ORAL | Status: DC
  Administered 2021-08-29: 5 mg
  Filled 2021-08-29 (×2): qty 1

## 2021-08-29 MED ORDER — ARTIFICIAL TEARS OPHTHALMIC OINT
1.0000 | TOPICAL_OINTMENT | Freq: Every day | OPHTHALMIC | Status: DC
Start: 2021-08-29 — End: 2021-08-30
  Administered 2021-08-29: 1 via OPHTHALMIC
  Filled 2021-08-29: qty 3.5

## 2021-08-29 MED ORDER — SODIUM CHLORIDE 0.9 % IV SOLN: INTRAVENOUS | Status: AC

## 2021-08-29 MED ORDER — OSMOLITE 1.5 CAL PO LIQD: 711.0000 mL | ORAL | Status: DC

## 2021-08-29 MED ORDER — LIDOCAINE VISCOUS HCL 2 % MT SOLN
OROMUCOSAL | Status: AC
  Filled 2021-08-29: qty 15

## 2021-08-29 MED ORDER — STERILE WATER FOR INJECTION IJ SOLN
INTRAMUSCULAR | Status: AC
  Filled 2021-08-29: qty 10

## 2021-08-29 MED ORDER — LATANOPROST 0.005 % OP SOLN
1.0000 [drp] | Freq: Every day | OPHTHALMIC | Status: DC
Start: 2021-08-29 — End: 2021-08-30
  Administered 2021-08-29: 1 [drp] via OPHTHALMIC
  Filled 2021-08-29: qty 2.5

## 2021-08-29 MED ORDER — CYCLOSPORINE 0.05 % OP EMUL
1.0000 [drp] | Freq: Two times a day (BID) | OPHTHALMIC | Status: DC
  Administered 2021-08-29 – 2021-08-30 (×3): 1 [drp] via OPHTHALMIC
  Filled 2021-08-29 (×4): qty 30

## 2021-08-29 MED ORDER — LEVOTHYROXINE SODIUM 100 MCG PO TABS
125.0000 ug | ORAL_TABLET | Freq: Every day | ORAL | Status: DC
  Administered 2021-08-29 – 2021-08-30 (×2): 125 ug
  Filled 2021-08-29 (×2): qty 1

## 2021-08-29 MED ORDER — GABAPENTIN 300 MG PO CAPS
300.0000 mg | ORAL_CAPSULE | Freq: Two times a day (BID) | ORAL | Status: DC
Start: 2021-08-29 — End: 2021-08-30
  Administered 2021-08-29 (×2): 300 mg
  Filled 2021-08-29 (×3): qty 1

## 2021-08-29 MED ORDER — ACETAMINOPHEN 650 MG RE SUPP: 650.0000 mg | Freq: Four times a day (QID) | RECTAL | Status: DC | PRN

## 2021-08-29 MED ORDER — HYDRALAZINE HCL 50 MG PO TABS
50.0000 mg | ORAL_TABLET | Freq: Three times a day (TID) | ORAL | Status: DC
Start: 2021-08-29 — End: 2021-08-30
  Administered 2021-08-29 (×2): 50 mg
  Filled 2021-08-29 (×3): qty 1

## 2021-08-29 MED ORDER — ACETAMINOPHEN 325 MG PO TABS: 650.0000 mg | ORAL_TABLET | Freq: Four times a day (QID) | ORAL | Status: DC | PRN

## 2021-08-29 NOTE — H&P (Signed)
History and Physical    Shannon [name] EXB:[id number] DOB: 11-20-1923 DOA: 08/28/2021  PCP: Sharon Seller, NP  Patient coming from: Home  Chief Complaint: GJ tube malfunction  HPI: Shannon [name] is a 88 y.o. female with medical history significant of DVT and PAF no longer on anticoagulation due to history of GI bleed, pyloric stenosis status post gastrojejunostomy 2017, bleeding esophagitis found on EGD July 2022, chronic HFpEF, hypertension, hypothyroidism, CAD, diet-controlled type 2 diabetes, CKD stage IIIb, COPD on home oxygen, stroke, PVD status post left popliteal angioplasty July 2022, toe/transmetatarsal amputations September 2022.  She presents to the ED today abdominal pain in the setting of GJ tube malfunction.  Her daughter was trying to flush the patient's tube after feeding on Friday when the balloon ruptured.  ED staff attempted to unclog the tube however there were no syringes available which fit her current tube.  ED PA discussed the case with Dr. Juliette Alcide with IR, will consult in the morning.  Labs showing WBC 4.7, hemoglobin 8.9 (chronically low and stable), platelet count 208k.  Sodium 141, potassium 4.1, chloride 110, bicarb 23, BUN 64, creatinine 2.0 (baseline 1.1-1.4), glucose 109.  No significant elevation of LFTs and lipase normal.  UA without signs of infection.  CT abdomen pelvis negative for acute finding.  Showing paraumbilical hernia containing transverse colon without bowel obstruction and adjacent separate periumbilical hernia containing fat.  Gastrojejunostomy tip is in the jejunum.  Patient states on Friday her daughter was trying to flush her feeding tube and it burst.  She is concerned that she has not been able to receive any nutrition and feels very weak.  States she is able to take in a few things by mouth such as juice and Jell-O.  She is not having much abdominal pain at present.  Also complaining of dyspnea on exertion and cough, unclear when symptoms started.   Denies chest pain or fevers.    Review of Systems:  Review of Systems  All other systems reviewed and are negative.  Past Medical History:  Diagnosis Date   Acute respiratory failure (HCC)    Anemia    previous blood transfusions   Arthritis    "all over"   Asthma    Atrial flutter (HCC)    Bradycardia    requiring previous d/c of BB and reduction of amiodarone   CAD (coronary artery disease)    nonobstructive per notes   Chronic diastolic CHF (congestive heart failure) (HCC)    CKD (chronic kidney disease), stage III (HCC)    Complication of blood transfusion    "got the wrong blood type at [facility] in ~ 2015; no adverse reaction that we are aware of"/daughter, [name] (01/27/2016)   COPD (chronic obstructive pulmonary disease) (HCC)    Depression    "light case"   DVT (deep venous thrombosis) (HCC) 01/2016   a. LLE DVT 01/2016 - switched from Eliquis to Coumadin.   Dyspnea    with some activity   Gastric stenosis    a. s/p stomach tube   GERD (gastroesophageal reflux disease)    GI bleed    Headache    History of 2019 novel coronavirus disease (COVID-19)    History of blood transfusion    "several" (01/27/2016)   History of stomach ulcers    Hyperlipidemia    Hypertension    Hypothyroidism    On home oxygen therapy    "2L; 9PM til 9AM" (06/27/2017)   PAD (peripheral artery disease) (  HCC)    a. prior gangrene, toe amputations, intervention.   PAF (paroxysmal atrial fibrillation) (HCC)    Paraesophageal hernia    Perforated gastric ulcer (HCC)    Pneumonia    "a few times" (06/27/2017)   Seasonal allergies    SIADH (syndrome of inappropriate ADH production) (HCC)    [name] 01/10/2015   Small bowel obstruction (HCC)    "I don't know how many" (01/11/2015)   Stroke (HCC)    several- no residual   Type II diabetes mellitus (HCC)    "related to prednisone use  for > 20 yr; once predinose stopped; no more DM RX" (01/27/2016)   UTI (urinary tract infection)  02/08/2016   Ventral hernia with bowel obstruction     Past Surgical History:  Procedure Laterality Date   ABDOMINAL AORTOGRAM N/A 09/21/2016   Procedure: Abdominal Aortogram;  Surgeon: Maeola Harman, MD;  Location: [facility] INVASIVE CV LAB;  Service: Cardiovascular;  Laterality: N/A;   ABDOMINAL AORTOGRAM W/LOWER EXTREMITY N/A 10/20/2020   Procedure: ABDOMINAL AORTOGRAM W/LOWER EXTREMITY;  Surgeon: Leonie Douglas, MD;  Location: [facility] INVASIVE CV LAB;  Service: Cardiovascular;  Laterality: N/A;   AMPUTATION Left 09/25/2016   Procedure: AMPUTATION DIGIT- LEFT 2ND AND 3RD TOES;  Surgeon: Larina Earthly, MD;  Location: [facility] OR;  Service: Vascular;  Laterality: Left;   AMPUTATION Right 05/03/2018   Procedure: AMPUTATION RIGHT GREAT TOE;  Surgeon: Larina Earthly, MD;  Location: [facility] OR;  Service: Vascular;  Laterality: Right;   CATARACT EXTRACTION W/ INTRAOCULAR LENS  IMPLANT, BILATERAL     CHOLECYSTECTOMY OPEN     COLECTOMY     ESOPHAGOGASTRODUODENOSCOPY N/A 01/19/2014   Procedure: ESOPHAGOGASTRODUODENOSCOPY (EGD);  Surgeon: Hilarie Fredrickson, MD;  Location: [facility] ENDOSCOPY;  Service: Endoscopy;  Laterality: N/A;   ESOPHAGOGASTRODUODENOSCOPY N/A 01/20/2014   Procedure: ESOPHAGOGASTRODUODENOSCOPY (EGD);  Surgeon: Hilarie Fredrickson, MD;  Location: [facility] ENDOSCOPY;  Service: Endoscopy;  Laterality: N/A;   ESOPHAGOGASTRODUODENOSCOPY N/A 03/19/2014   Procedure: ESOPHAGOGASTRODUODENOSCOPY (EGD);  Surgeon: Rachael Fee, MD;  Location: [facility] ENDOSCOPY;  Service: Endoscopy;  Laterality: N/A;   ESOPHAGOGASTRODUODENOSCOPY N/A 07/08/2015   Procedure: ESOPHAGOGASTRODUODENOSCOPY (EGD);  Surgeon: Sherrilyn Rist, MD;  Location: [facility] ENDOSCOPY;  Service: Endoscopy;  Laterality: N/A;   ESOPHAGOGASTRODUODENOSCOPY (EGD) WITH PROPOFOL N/A 09/15/2015   Procedure: ESOPHAGOGASTRODUODENOSCOPY (EGD) WITH PROPOFOL;  Surgeon: Ruffin Frederick, MD;  Location: [facility] ENDOSCOPY;  Service: Gastroenterology;  Laterality: N/A;    ESOPHAGOGASTRODUODENOSCOPY (EGD) WITH PROPOFOL N/A 10/12/2020   Procedure: ESOPHAGOGASTRODUODENOSCOPY (EGD) WITH PROPOFOL;  Surgeon: Iva Boop, MD;  Location: [name] ENDOSCOPY;  Service: Endoscopy;  Laterality: N/A;   FOOT SURGERY     GASTROJEJUNOSTOMY     hx/notes 01/10/2015   GASTROJEJUNOSTOMY N/A 09/23/2015   Procedure: OPEN GASTROJEJUNOSTOMY TUBE PLACEMENT;  Surgeon: De Blanch Kinsinger, MD;  Location: [facility] ORS;  Service: General;  Laterality: N/A;   GLAUCOMA SURGERY Bilateral    HEMATOMA EVACUATION Right 10/20/2020   Procedure: EVACUATION HEMATOMA RIGHT GROIN;  Surgeon: Leonie Douglas, MD;  Location: [facility] OR;  Service: Vascular;  Laterality: Right;   IR CM INJ ANY COLONIC TUBE W/FLUORO  01/05/2017   IR CM INJ ANY COLONIC TUBE W/FLUORO  06/07/2017   IR CM INJ ANY COLONIC TUBE W/FLUORO  11/05/2017   IR CM INJ ANY COLONIC TUBE W/FLUORO  09/18/2018   IR CM INJ ANY COLONIC TUBE W/FLUORO  03/06/2019   IR CM INJ ANY COLONIC TUBE W/FLUORO  03/11/2019   IR GASTR TUBE CONVERT GASTR-JEJ  PER W/FL MOD SED  04/30/2020   IR GENERIC HISTORICAL  01/07/2016   IR GJ TUBE CHANGE 01/07/2016 Malachy Moan, MD [facility]INTER[facility] RAD   IR GENERIC HISTORICAL  01/27/2016   IR MECH REMOV OBSTRUC MAT ANY COLON TUBE W/FLUORO 01/27/2016 Richarda Overlie, MD [facility]   IR GENERIC HISTORICAL  02/07/2016   IR PATIENT EVAL TECH 0-60 MINS Darrell K Allred, PA-C [facility]   IR GENERIC HISTORICAL  02/08/2016   IR GJ TUBE CHANGE 02/08/2016 Berdine Dance, MD [facility]   IR GENERIC HISTORICAL  01/06/2016   IR GJ TUBE CHANGE 01/06/2016 [facility] OUT REF   IR GENERIC HISTORICAL  05/02/2016   IR CM INJ ANY COLONIC TUBE W/FLUORO 05/02/2016 Oley Balm, MD [facility]   IR GENERIC HISTORICAL  05/15/2016   IR GJ TUBE CHANGE 05/15/2016 Simonne Come, MD [facility]   IR GENERIC HISTORICAL  06/28/2016   IR GJ TUBE CHANGE 06/28/2016 [facility]   IR GJ TUBE CHANGE  02/20/2017   IR GJ TUBE CHANGE  05/10/2017   IR GJ TUBE CHANGE   07/06/2017   IR GJ TUBE CHANGE  08/02/2017   IR GJ TUBE CHANGE  10/15/2017   IR GJ TUBE CHANGE  12/26/2017   IR GJ TUBE CHANGE  04/08/2018   IR GJ TUBE CHANGE  06/19/2018   IR GJ TUBE CHANGE  03/03/2019   IR GJ TUBE CHANGE  06/06/2019   IR GJ TUBE CHANGE  09/26/2019   IR GJ TUBE CHANGE  01/09/2020   IR GJ TUBE CHANGE  05/12/2020   IR GJ TUBE CHANGE  10/05/2020   IR GJ TUBE CHANGE  10/14/2020   IR GJ TUBE CHANGE  10/19/2020   IR GJ TUBE CHANGE  02/25/2021   IR GJ TUBE CHANGE  03/23/2021   IR PATIENT EVAL TECH 0-60 MINS  10/19/2016   IR PATIENT EVAL TECH 0-60 MINS  12/25/2016   IR PATIENT EVAL TECH 0-60 MINS  01/29/2017   IR PATIENT EVAL TECH 0-60 MINS  04/04/2017   IR PATIENT EVAL TECH 0-60 MINS  04/30/2017   IR PATIENT EVAL TECH 0-60 MINS  07/31/2017   IR REPLC DUODEN/JEJUNO TUBE PERCUT W/FLUORO  11/14/2016   IR REPLC GASTRO/COLONIC TUBE PERCUT W/FLUORO  05/31/2021   IRRIGATION AND DEBRIDEMENT FOOT Bilateral 12/26/2020   Procedure: IRRIGATION AND DEBRIDEMENT FOOT AND BONE BIOPSY;  Surgeon: Vivi Barrack, DPM;  Location: [facility] OR;  Service: Podiatry;  Laterality: Bilateral;   LAPAROTOMY N/A 01/20/2015   Procedure: EXPLORATORY LAPAROTOMY;  Surgeon: Abigail Miyamoto, MD;  Location: [facility] OR;  Service: General;  Laterality: N/A;   LOWER EXTREMITY ANGIOGRAPHY Left 09/21/2016   Procedure: Lower Extremity Angiography;  Surgeon: Maeola Harman, MD;  Location: [facility] INVASIVE CV LAB;  Service: Cardiovascular;  Laterality: Left;   LOWER EXTREMITY ANGIOGRAPHY Right 06/27/2017   Procedure: Lower Extremity Angiography;  Surgeon: Maeola Harman, MD;  Location: [facility] INVASIVE CV LAB;  Service: Cardiovascular;  Laterality: Right;   LOWER EXTREMITY ANGIOGRAPHY Right 12/24/2020   Procedure: LOWER EXTREMITY ANGIOGRAPHY;  Surgeon: Leonie Douglas, MD;  Location: [facility] INVASIVE CV LAB;  Service: Cardiovascular;  Laterality: Right;   LYSIS OF ADHESION N/A 01/20/2015   Procedure: LYSIS OF  ADHESIONS < 1 HOUR;  Surgeon: Abigail Miyamoto, MD;  Location: [facility] OR;  Service: General;  Laterality: N/A;   PERIPHERAL VASCULAR BALLOON ANGIOPLASTY Left 09/21/2016   Procedure: Peripheral Vascular Balloon Angioplasty;  Surgeon: Maeola Harman, MD;  Location: [facility] INVASIVE CV LAB;  Service: Cardiovascular;  Laterality: Left;  drug coated balloon   PERIPHERAL VASCULAR BALLOON ANGIOPLASTY Right 06/27/2017   Procedure: PERIPHERAL VASCULAR BALLOON ANGIOPLASTY;  Surgeon: Maeola Harman, MD;  Location: [facility] INVASIVE CV LAB;  Service: Cardiovascular;  Laterality: Right;  SFA/TPTRUNK   PERIPHERAL VASCULAR INTERVENTION Left 10/20/2020   Procedure: PERIPHERAL VASCULAR INTERVENTION;  Surgeon: Leonie Douglas, MD;  Location: [facility] INVASIVE CV LAB;  Service: Cardiovascular;  Laterality: Left;   TONSILLECTOMY     TRANSMETATARSAL AMPUTATION Right 11/14/2019   Procedure: TRANSMETATARSAL AMPUTATION RIGHT FOOT;  Surgeon: Felecia Shelling, DPM;  Location: [facility] OR;  Service: Podiatry;  Laterality: Right;   TUBAL LIGATION     VENTRAL HERNIA REPAIR  2015   incarcerated ventral hernia ([facility] 09/2013)/notes 01/10/2015   WOUND EXPLORATION Right 10/20/2020   Procedure: PRIMARY CLOSURE COMMON FEMORAL ARTERY;  Surgeon: Leonie Douglas, MD;  Location: [facility] OR;  Service: Vascular;  Laterality: Right;     reports that she has never smoked. She has never been exposed to tobacco smoke. She quit smokeless tobacco use about 41 years ago.  Her smokeless tobacco use included snuff. She reports that she does not drink alcohol and does not use drugs.  Allergies  Allergen Reactions   Penicillins Itching, Rash and Other (See Comments)    Tolerated amoxicillin, unasyn, zosyn & cephalosporins in the past. Did it involve swelling of the face/tongue/throat, SOB, or low BP? No Did it involve sudden or severe rash/hives, skin peeling, or any reaction on the inside of your mouth or nose? No Did you need to seek medical attention at  a hospital or doctor's office? Unknown When did it last happen?      5+ years If all above answers are "NO", may proceed with cephalosporin use.     Family History  Problem Relation Age of Onset   Stroke Mother    Hypertension Mother    Diabetes Brother    Glaucoma Son    Glaucoma Son    Heart attack Neg Hx    Colon cancer Neg Hx    Stomach cancer Neg Hx     Prior to Admission medications   Medication Sig Start Date End Date Taking? Authorizing Provider  acetaminophen (TYLENOL) 500 MG tablet Take 500 mg by mouth every 6 (six) hours as needed for moderate pain.    [provider]  amLODipine (NORVASC) 5 MG tablet TAKE 1 TABLET BY MOUTH EVERY DAY 04/20/21   Sharon Seller, NP  aspirin 81 MG EC tablet Take 1 tablet (81 mg total) by mouth daily. Swallow whole. 10/20/20 10/20/21  Leonie Douglas, MD  atorvastatin (LIPITOR) 10 MG tablet TAKE 1 TABLET BY MOUTH EVERY DAY 07/06/21   Sharon Seller, NP  b complex vitamins tablet Take 1 tablet by mouth daily.     [provider]  cholecalciferol (VITAMIN D3) 25 MCG (1000 UNIT) tablet Take 1,000 Units by mouth daily.    [provider]  cycloSPORINE (RESTASIS) 0.05 % ophthalmic emulsion USE 1 DROP INTO BOTH EYES TWICE DAILY 01/29/17   Kirt Boys, DO  doxycycline (VIBRA-TABS) 100 MG tablet TAKE 1 TABLET BY MOUTH TWICE A DAY 08/17/21   Felecia Shelling, DPM  DULoxetine (CYMBALTA) 30 MG capsule TAKE 1 CAPSULE BY MOUTH EVERY DAY 01/31/21   Sharon Seller, NP  ferrous sulfate 220 (44 Fe) MG/5ML solution TAKE 7.4 MLS (325 MG TOTAL) BY MOUTH DAILY. Patient taking differently: Place 220 mg into feeding tube daily. 12/16/20  Sharon Seller, NP  fluticasone (FLONASE) 50 MCG/ACT nasal spray 2 sprays into each nostril daily. 12/09/20   Sharon Seller, NP  furosemide (LASIX) 40 MG tablet TAKE 0.5-1 TABLETS (20-40 MG TOTAL) BY MOUTH SEE ADMIN INSTRUCTIONS. TAKE 20 MG DAILY, MAY INSTEAD TAKE 40 MG DAILY AS NEEDED FOR  EDEMA 08/04/20   Meriam Sprague, MD  gabapentin (NEURONTIN) 300 MG capsule TAKE 1 CAPSULE BY MOUTH TWICE A DAY 06/29/21   Sharon Seller, NP  gentamicin cream (GARAMYCIN) 0.1 % Apply 1 application topically daily as needed (dry skin). 10/01/19   Felecia Shelling, DPM  hydrALAZINE (APRESOLINE) 50 MG tablet Take 1 tablet (50 mg total) by mouth 3 (three) times daily. 07/18/21   Meriam Sprague, MD  Insulin Pen Needle (B-D UF III MINI PEN NEEDLES) 31G X 5 MM MISC Use twice daily with giving insulin injections. Dx E11.9 07/17/18   Sharon Seller, NP  latanoprost (XALATAN) 0.005 % ophthalmic solution Place 1 drop into both eyes at bedtime.    [provider]  levothyroxine (SYNTHROID) 125 MCG tablet TAKE 1 TABLET BY MOUTH EVERY DAY 01/31/21   Sharon Seller, NP  liver oil-zinc oxide (DESITIN) 40 % ointment Apply topically 2 (two) times daily. 02/16/21   Regalado, Belkys A, MD  loratadine (CLARITIN) 10 MG tablet Take 10 mg by mouth daily.    [provider]  mometasone-formoterol (DULERA) 200-5 MCG/ACT AERO INHALE TWO PUFFS BY MOUTH TWICE DAILY 10/05/20   Sharon Seller, NP  Multiple Vitamin (MULTIVITAMIN WITH MINERALS) TABS tablet Take 1 tablet by mouth daily.    [provider]  Nutritional Supplements (FEEDING SUPPLEMENT, OSMOLITE 1.5 CAL,) LIQD Place 711 mLs into feeding tube See admin instructions. Take 711 ml over a 12 hour period per day 9pm-9am at a rate of 65    [provider]  nystatin cream (MYCOSTATIN) APPLY TO AFFECTED AREA TWICE A DAY 05/30/21   Sharon Seller, NP  pantoprazole (PROTONIX) 40 MG tablet Take 1 tablet (40 mg total) by mouth 2 (two) times daily. 10/15/20   Sharon Seller, NP  Polyethyl Glycol-Propyl Glycol (SYSTANE) 0.4-0.3 % GEL ophthalmic gel Place 1 application into both eyes 2 (two) times daily as needed (moisture).    [provider]  Probiotic Product (EQL PROBIOTIC COLON SUPPORT PO) Take 1 capsule by mouth  daily.    [provider]  senna-docusate (SENOKOT-S) 8.6-50 MG tablet Take 1 tablet by mouth at bedtime.    [provider]  Sodium Chloride-Sodium Bicarb (NETI POT SINUS WASH NA) Place 1 application into the nose at bedtime.    [provider]  traMADol (ULTRAM) 50 MG tablet Place 1 tablet (50 mg total) into feeding tube every 6 (six) hours as needed for severe pain. 10/22/20   Lars Mage, PA-C  Water For Irrigation, Sterile (FREE WATER) SOLN Place 180 mLs into feeding tube 3 (three) times daily.    [provider]  White Petrolatum-Mineral Oil (SYSTANE NIGHTTIME) OINT Place 1 application into both eyes at bedtime.     [provider]    Physical Exam: Vitals:   08/29/21 0400 08/29/21 0415 08/29/21 0430 08/29/21 0433  BP: (!) 145/56 (!) 139/58 (!) 145/52   Pulse: 69 71 71   Resp: 15 16 14    Temp:    98.1 F (36.7 C)  TempSrc:    Oral  SpO2: 100% 100% 100%   Weight:      Height:  Physical Exam Vitals reviewed.  Constitutional:      General: She is not in acute distress. HENT:     Head: Normocephalic and atraumatic.     Mouth/Throat:     Mouth: Mucous membranes are dry.  Eyes:     Extraocular Movements: Extraocular movements intact.  Cardiovascular:     Rate and Rhythm: Normal rate and regular rhythm.     Pulses: Normal pulses.  Pulmonary:     Effort: Pulmonary effort is normal. No respiratory distress.     Breath sounds: Normal breath sounds. No wheezing or rales.  Abdominal:     General: Bowel sounds are normal. There is no distension.     Palpations: Abdomen is soft.     Tenderness: There is no abdominal tenderness. There is no guarding.  Musculoskeletal:        General: No swelling or tenderness.     Cervical back: Normal range of motion.  Skin:    General: Skin is warm and dry.  Neurological:     General: No focal deficit present.     Mental Status: She is alert and oriented to person, place, and time.      Labs on Admission: I have personally reviewed following labs and imaging studies  CBC: Recent Labs  Lab 08/28/21 2150  WBC 4.7  NEUTROABS 2.6  HGB 8.9*  HCT 26.8*  MCV 96.8  PLT 208   Basic Metabolic Panel: Recent Labs  Lab 08/28/21 2150  NA 141  K 4.1  CL 110  CO2 23  GLUCOSE 109*  BUN 64*  CREATININE 2.04*  CALCIUM 9.5   GFR: Estimated Creatinine Clearance: 14.2 mL/min (A) (by C-G formula based on SCr of 2.04 mg/dL (H)). Liver Function Tests: Recent Labs  Lab 08/28/21 2150  AST 42*  ALT 19  ALKPHOS 77  BILITOT 0.6  PROT 7.1  ALBUMIN 3.2*   Recent Labs  Lab 08/28/21 2150  LIPASE 21   No results for input(s): AMMONIA in the last 168 hours. Coagulation Profile: No results for input(s): INR, PROTIME in the last 168 hours. Cardiac Enzymes: No results for input(s): CKTOTAL, CKMB, CKMBINDEX, TROPONINI in the last 168 hours. BNP (last 3 results) Recent Labs    05/04/21 1502  PROBNP 769*   HbA1C: No results for input(s): HGBA1C in the last 72 hours. CBG: Recent Labs  Lab 08/28/21 2149  GLUCAP 107*   Lipid Profile: No results for input(s): CHOL, HDL, LDLCALC, TRIG, CHOLHDL, LDLDIRECT in the last 72 hours. Thyroid Function Tests: No results for input(s): TSH, T4TOTAL, FREET4, T3FREE, THYROIDAB in the last 72 hours. Anemia Panel: No results for input(s): VITAMINB12, FOLATE, FERRITIN, TIBC, IRON, RETICCTPCT in the last 72 hours. Urine analysis:    Component Value Date/Time   COLORURINE YELLOW 08/29/2021 0239   APPEARANCEUR HAZY (A) 08/29/2021 0239   LABSPEC 1.010 08/29/2021 0239   PHURINE 7.0 08/29/2021 0239   GLUCOSEU NEGATIVE 08/29/2021 0239   HGBUR NEGATIVE 08/29/2021 0239   BILIRUBINUR NEGATIVE 08/29/2021 0239   BILIRUBINUR Neg 11/01/2017 1228   KETONESUR NEGATIVE 08/29/2021 0239   PROTEINUR NEGATIVE 08/29/2021 0239   UROBILINOGEN 0.2 11/01/2017 1228   UROBILINOGEN 0.2 01/27/2015 2308   NITRITE NEGATIVE 08/29/2021 0239   LEUKOCYTESUR  NEGATIVE 08/29/2021 0239    Radiological Exams on Admission: I have personally reviewed images CT ABDOMEN PELVIS WO CONTRAST  Result Date: 08/28/2021 CLINICAL DATA:  Abdominal pain, acute, nonlocalized EXAM: CT ABDOMEN AND PELVIS WITHOUT CONTRAST TECHNIQUE: Multidetector CT imaging of the abdomen  and pelvis was performed following the standard protocol without IV contrast. RADIATION DOSE REDUCTION: This exam was performed according to the departmental dose-optimization program which includes automated exposure control, adjustment of the mA and/or kV according to patient size and/or use of iterative reconstruction technique. COMPARISON:  02/14/2021 FINDINGS: Lower chest: Heavily calcified aorta and visualized coronary arteries. Heart is mildly enlarged. No acute abnormality. Hepatobiliary: Scattered hypodensities in the liver are stable since prior study, likely cysts. Prior cholecystectomy. No biliary ductal dilatation. Pancreas: No focal abnormality or ductal dilatation. Spleen: No focal abnormality.  Normal size. Adrenals/Urinary Tract: No adrenal abnormality. No focal renal abnormality. No stones or hydronephrosis. Urinary bladder is unremarkable. Stomach/Bowel: Paraumbilical ventral hernia noted containing transverse colon. No evidence of bowel obstruction. Gastrojejunostomy tube in place. The retention balloon is in the stomach. The tip is in the proximal jejunum. Stomach and small bowel decompressed, unremarkable. Scattered colonic diverticula. Vascular/Lymphatic: Heavily calcified aorta and iliac vessels. No evidence of aneurysm or adenopathy. Reproductive: Uterus and adnexa unremarkable.  No mass. Other: No free fluid or free air. Adjacent to the paraumbilical hernia containing the transverse colon, there is a left paraumbilical hernia containing fat. Musculoskeletal: No acute bony abnormality. Degenerative disc disease throughout the lumbar spine. Mild compression fracture at T11 is stable since prior  study. IMPRESSION: No acute findings in the abdomen or pelvis. Paraumbilical hernia containing transverse colon. No bowel obstruction. Adjacent separate periumbilical hernia containing fat. Gastrojejunostomy tip is in the jejunum. Aortoiliac atherosclerosis. Electronically Signed   By: Charlett Nose M.D.   On: 08/28/2021 23:01    EKG: Independently reviewed.  Sinus rhythm, baseline wander.  No acute changes.  Assessment and Plan  AKI on CKD stage IIIb Likely prerenal from dehydration as patient has not been able to receive tube feeds. BUN 64, creatinine 2.0 (baseline 1.1-1.4). -Gentle IV fluid hydration given chronic HFpEF -Monitor renal function and urine output -Avoid nephrotoxic agents/contrast  Pyloric stenosis status post gastrojejunostomy 2017 Tube malfunction Her daughter was trying to flush the patient's tube after feeding on Friday when the balloon ruptured.  ED staff attempted to unclog the tube however there were no syringes available which fit her current tube.   -ED PA discussed the case with Dr. Juliette Alcide with IR, will consult in the morning.   Paroxysmal A-fib Stable, currently in sinus rhythm.  No longer on anticoagulation due to history of GI bleed. -Pharmacy med rec pending.  Anemia of chronic disease Hemoglobin currently stable and not endorsing any symptoms of GI bleed. -Continue to monitor  Chronic HFpEF Echo done February 2021 showing EF 65 to 70%, grade 1 diastolic dysfunction.  Patient is endorsing dyspnea on exertion and cough but no signs of volume overload on exam.  Not hypoxic. -BNP and chest x-ray  Diet-controlled type 2 diabetes A1c 6.3 on 06/08/2021.  Hypertension Stable -Pharmacy med rec pending.  Hypothyroidism -Pharmacy med rec pending.  CAD EKG without acute changes and not endorsing chest pain. -Pharmacy med rec pending.  COPD Stable, not wheezing. -Pharmacy med rec pending.  PVD -Pharmacy med rec pending.  DVT prophylaxis: SCDs Code  Status: DNR (discussed with the patient) Family Communication: No family available at this time. Consults called: IR Level of care: Med-Surg Admission status: It is my clinical opinion that referral for OBSERVATION is reasonable and necessary in this patient based on the above information provided. The aforementioned taken together are felt to place the patient at high risk for further clinical deterioration. However, it is anticipated that the  patient may be medically stable for discharge from the hospital within 24 to 48 hours.   John Giovanni MD [facility]  If 7PM-7AM, please contact night-coverage [email or web address]  08/29/2021, 5:00 AM

## 2021-08-29 NOTE — ED Notes (Signed)
Unable to find correct syringe for pt's gj tube. Contacted [facility] to see if syringe is stocked there. Awaiting response.

## 2021-08-29 NOTE — ED Notes (Signed)
ED TO INPATIENT HANDOFF REPORT  ED Nurse Name and Phone #: Andi Hence, RN  S Name/Age/Gender Shannon [name] 88 y.o. female Room/Bed: 016C/016C  Code Status   Code Status: Prior  Home/SNF/Other Home Patient oriented to: self, place, time, and situation Is this baseline? Yes   Triage Complete: Triage complete  Chief Complaint AKI (acute kidney injury) ([place]) [N17.9]  Triage Note Pt bib gcems from home for abdominal pain starting today. Pt has feeding tube that was displaced on Friday. Pt was going to wait until after the weekend to be seen for it but the pain worsened tonight. Mentation at baseline. 100 mcg  fent and 180 NS given PTA.   BP 152/62, HR 88, Spo2 100%      Allergies Allergies  Allergen Reactions   Penicillins Itching, Rash and Other (See Comments)    Tolerated amoxicillin, unasyn, zosyn & cephalosporins in the past. Did it involve swelling of the face/tongue/throat, SOB, or low BP? No Did it involve sudden or severe rash/hives, skin peeling, or any reaction on the inside of your mouth or nose? No Did you need to seek medical attention at a hospital or doctor's office? Unknown When did it last happen?      5+ years If all above answers are "NO", may proceed with cephalosporin use.     Level of Care/Admitting Diagnosis ED Disposition     ED Disposition  Admit   Condition  --   Comment  Hospital Area: [place] [[id number]]  Level of Care: Med-Surg [16]  May place patient in observation at [facility] or [place] if equivalent level of care is available:: Yes  Covid Evaluation: Asymptomatic - no recent exposure (last 10 days) testing not required  Diagnosis: AKI (acute kidney injury) [facility]) [[id number]]  Admitting Physician: Shela Leff [[id number]]  Attending Physician: Shela Leff [[id number]]          B Medical/Surgery History Past Medical History:  Diagnosis Date   Acute respiratory failure ([place])    Anemia     previous blood transfusions   Arthritis    "all over"   Asthma    Atrial flutter (HCC)    Bradycardia    requiring previous d/c of BB and reduction of amiodarone   CAD (coronary artery disease)    nonobstructive per notes   Chronic diastolic CHF (congestive heart failure) ([place])    CKD (chronic kidney disease), stage III ([place])    Complication of blood transfusion    "got the wrong blood type at [facility] in ~ 2015; no adverse reaction that we are aware of"/daughter, [name] (01/27/2016)   COPD (chronic obstructive pulmonary disease) ([place])    Depression    "light case"   DVT (deep venous thrombosis) ([place]) 01/2016   a. LLE DVT 01/2016 - switched from Eliquis to Coumadin.   Dyspnea    with some activity   Gastric stenosis    a. s/p stomach tube   GERD (gastroesophageal reflux disease)    GI bleed    Headache    History of 2019 novel coronavirus disease (COVID-19)    History of blood transfusion    "several" (01/27/2016)   History of stomach ulcers    Hyperlipidemia    Hypertension    Hypothyroidism    On home oxygen therapy    "2L; 9PM til 9AM" (06/27/2017)   PAD (peripheral artery disease) (HCC)    a. prior gangrene, toe amputations, intervention.   PAF (paroxysmal atrial fibrillation) ([place])  Paraesophageal hernia    Perforated gastric ulcer ([place])    Pneumonia    "a few times" (06/27/2017)   Seasonal allergies    SIADH (syndrome of inappropriate ADH production) ([place])    [name] 01/10/2015   Small bowel obstruction ([place])    "I don't know how many" (01/11/2015)   Stroke [facility])    several- no residual   Type II diabetes mellitus ([place])    "related to prednisone use  for > 20 yr; once predinose stopped; no more DM RX" (01/27/2016)   UTI (urinary tract infection) 02/08/2016   Ventral hernia with bowel obstruction    Past Surgical History:  Procedure Laterality Date   ABDOMINAL AORTOGRAM N/A 09/21/2016   Procedure: Abdominal Aortogram;  Surgeon: Waynetta Sandy,  MD;  Location: [place] [facility];  Service: Cardiovascular;  Laterality: N/A;   ABDOMINAL AORTOGRAM W/LOWER EXTREMITY N/A 10/20/2020   Procedure: ABDOMINAL AORTOGRAM W/LOWER EXTREMITY;  Surgeon: Cherre Robins, MD;  Location: [facility];  Service: Cardiovascular;  Laterality: N/A;   AMPUTATION Left 09/25/2016   Procedure: AMPUTATION DIGIT- LEFT 2ND AND 3RD TOES;  Surgeon: Rosetta Posner, MD;  Location: [place];  Service: Vascular;  Laterality: Left;   AMPUTATION Right 05/03/2018   Procedure: AMPUTATION RIGHT GREAT TOE;  Surgeon: Rosetta Posner, MD;  [facility];  Service: Vascular;  Laterality: Right;   CATARACT EXTRACTION W/ INTRAOCULAR LENS  IMPLANT, BILATERAL     CHOLECYSTECTOMY OPEN     COLECTOMY     ESOPHAGOGASTRODUODENOSCOPY N/A 01/19/2014   Procedure: ESOPHAGOGASTRODUODENOSCOPY (EGD);  Surgeon: Irene Shipper, MD;  Location[facility];  Service: Endoscopy;  Laterality: N/A;   ESOPHAGOGASTRODUODENOSCOPY N/A 01/20/2014   Procedure: ESOPHAGOGASTRODUODENOSCOPY (EGD);  Surgeon: Irene Shipper, MD;  Location: [facility];  Service: Endoscopy;  Laterality: N/A;   ESOPHAGOGASTRODUODENOSCOPY N/A 03/19/2014   Procedure: ESOPHAGOGASTRODUODENOSCOPY (EGD);  Surgeon: Milus Banister, MD;  Location: [facility];  Service: Endoscopy;  Laterality: N/A;   ESOPHAGOGASTRODUODENOSCOPY N/A 07/08/2015   Procedure: ESOPHAGOGASTRODUODENOSCOPY (EGD);  Surgeon: Doran Stabler, MD;  Location: [facility];  Service: Endoscopy;  Laterality: N/A;   ESOPHAGOGASTRODUODENOSCOPY (EGD) WITH PROPOFOL N/A 09/15/2015   Procedure: ESOPHAGOGASTRODUODENOSCOPY (EGD) WITH PROPOFOL;  Surgeon: Manus Gunning, MD;  [facility];  Service: Gastroenterology;  Laterality: N/A;   ESOPHAGOGASTRODUODENOSCOPY (EGD) WITH PROPOFOL N/A 10/12/2020   Procedure: ESOPHAGOGASTRODUODENOSCOPY (EGD) WITH PROPOFOL;  Surgeon: Gatha Mayer, MD;  Location: [place];  Service: Endoscopy;  Laterality: N/A;   FOOT  SURGERY     GASTROJEJUNOSTOMY     hx/notes 01/10/2015   GASTROJEJUNOSTOMY N/A 09/23/2015   Procedure: OPEN GASTROJEJUNOSTOMY TUBE PLACEMENT;  Surgeon: Arta Bruce Kinsinger, MD;  Location: [facility];  Service: General;  Laterality: N/A;   GLAUCOMA SURGERY Bilateral    HEMATOMA EVACUATION Right 10/20/2020   Procedure: EVACUATION HEMATOMA RIGHT GROIN;  Surgeon: Cherre Robins, MD;  Location: [place];  Service: Vascular;  Laterality: Right;   IR CM INJ ANY COLONIC TUBE W/FLUORO  01/05/2017   IR CM INJ ANY COLONIC TUBE W/FLUORO  06/07/2017   IR CM INJ ANY COLONIC TUBE W/FLUORO  11/05/2017   IR CM INJ ANY COLONIC TUBE W/FLUORO  09/18/2018   IR CM INJ ANY COLONIC TUBE W/FLUORO  03/06/2019   IR CM INJ ANY COLONIC TUBE W/FLUORO  03/11/2019   IR GASTR TUBE CONVERT GASTR-JEJ PER W/FL MOD SED  04/30/2020   IR GENERIC HISTORICAL  01/07/2016   IR [place] TUBE CHANGE 01/07/2016 [place]  Laurence Ferrari, MD [facility]   IR GENERIC HISTORICAL  01/27/2016   IR MECH REMOV OBSTRUC MAT ANY COLON TUBE W/FLUORO 01/27/2016 Markus Daft, MD [facility]   IR GENERIC HISTORICAL  02/07/2016   IR PATIENT EVAL TECH 0-60 MINS Darrell K Allred, PA-C [facility]   IR GENERIC HISTORICAL  02/08/2016   IR GJ TUBE CHANGE 02/08/2016 Greggory Keen, MD [facility]   IR GENERIC HISTORICAL  01/06/2016   IR GJ TUBE CHANGE 01/06/2016 [facility] OUT REF   IR GENERIC HISTORICAL  05/02/2016   IR CM INJ ANY COLONIC TUBE W/FLUORO 05/02/2016 Arne Cleveland, MD [facility]   IR GENERIC HISTORICAL  05/15/2016   IR GJ TUBE CHANGE 05/15/2016 Sandi Mariscal, MD [facility]   IR GENERIC HISTORICAL  06/28/2016   IR GJ TUBE CHANGE 06/28/2016 [facility]   IR GJ TUBE CHANGE  02/20/2017   IR GJ TUBE CHANGE  05/10/2017   IR GJ TUBE CHANGE  07/06/2017   IR GJ TUBE CHANGE  08/02/2017   IR GJ TUBE CHANGE  10/15/2017   IR GJ TUBE CHANGE  12/26/2017   IR GJ TUBE CHANGE  04/08/2018   IR GJ TUBE CHANGE  06/19/2018   IR GJ TUBE CHANGE  03/03/2019   IR GJ TUBE  CHANGE  06/06/2019   IR GJ TUBE CHANGE  09/26/2019   IR GJ TUBE CHANGE  01/09/2020   IR GJ TUBE CHANGE  05/12/2020   IR GJ TUBE CHANGE  10/05/2020   IR GJ TUBE CHANGE  10/14/2020   IR GJ TUBE CHANGE  10/19/2020   IR GJ TUBE CHANGE  02/25/2021   IR GJ TUBE CHANGE  03/23/2021   IR PATIENT EVAL TECH 0-60 MINS  10/19/2016   IR PATIENT EVAL TECH 0-60 MINS  12/25/2016   IR PATIENT EVAL TECH 0-60 MINS  01/29/2017   IR PATIENT EVAL TECH 0-60 MINS  04/04/2017   IR PATIENT EVAL TECH 0-60 MINS  04/30/2017   IR PATIENT EVAL TECH 0-60 MINS  07/31/2017   IR REPLC DUODEN/JEJUNO TUBE PERCUT W/FLUORO  11/14/2016   IR REPLC GASTRO/COLONIC TUBE PERCUT W/FLUORO  05/31/2021   IRRIGATION AND DEBRIDEMENT FOOT Bilateral 12/26/2020   Procedure: IRRIGATION AND DEBRIDEMENT FOOT AND BONE BIOPSY;  Surgeon: Trula Slade, DPM;  Location: [place];  Service: Podiatry;  Laterality: Bilateral;   LAPAROTOMY N/A 01/20/2015   Procedure: EXPLORATORY LAPAROTOMY;  Surgeon: Coralie Keens, MD;  Location: [place];  Service: General;  Laterality: N/A;   LOWER EXTREMITY ANGIOGRAPHY Left 09/21/2016   Procedure: Lower Extremity Angiography;  Surgeon: Waynetta Sandy, MD;  Location: [place] [facility];  Service: Cardiovascular;  Laterality: Left;   LOWER EXTREMITY ANGIOGRAPHY Right 06/27/2017   Procedure: Lower Extremity Angiography;  Surgeon: Waynetta Sandy, MD;  Location: [place] [facility];  Service: Cardiovascular;  Laterality: Right;   LOWER EXTREMITY ANGIOGRAPHY Right 12/24/2020   Procedure: LOWER EXTREMITY ANGIOGRAPHY;  Surgeon: Cherre Robins, MD;  Location: [place] [facility];  Service: Cardiovascular;  Laterality: Right;   LYSIS OF ADHESION N/A 01/20/2015   Procedure: LYSIS OF ADHESIONS < 1 HOUR;  Surgeon: Coralie Keens, MD;  Location: [place];  Service: General;  Laterality: N/A;   PERIPHERAL VASCULAR BALLOON ANGIOPLASTY Left 09/21/2016   Procedure: Peripheral Vascular Balloon Angioplasty;   Surgeon: Waynetta Sandy, MD;  Location: [place] [facility];  Service: Cardiovascular;  Laterality: Left;  drug coated balloon   PERIPHERAL VASCULAR BALLOON ANGIOPLASTY Right 06/27/2017   Procedure: PERIPHERAL  VASCULAR BALLOON ANGIOPLASTY;  Surgeon: Waynetta Sandy, MD;  Location: [place] [facility];  Service: Cardiovascular;  Laterality: Right;  SFA/TPTRUNK   PERIPHERAL VASCULAR INTERVENTION Left 10/20/2020   Procedure: PERIPHERAL VASCULAR INTERVENTION;  Surgeon: Cherre Robins, MD;  Location: [place] [facility];  Service: Cardiovascular;  Laterality: Left;   TONSILLECTOMY     TRANSMETATARSAL AMPUTATION Right 11/14/2019   Procedure: TRANSMETATARSAL AMPUTATION RIGHT FOOT;  Surgeon: Edrick Kins, DPM;  Location: [place];  Service: Podiatry;  Laterality: Right;   TUBAL LIGATION     VENTRAL HERNIA REPAIR  2015   incarcerated ventral hernia ([facility] 09/2013)/notes 01/10/2015   WOUND EXPLORATION Right 10/20/2020   Procedure: PRIMARY CLOSURE COMMON FEMORAL ARTERY;  Surgeon: Cherre Robins, MD;  Location: [place];  Service: Vascular;  Laterality: Right;     A IV Location/Drains/Wounds Patient Lines/Drains/Airways Status     Active Line/Drains/Airways     Name Placement date Placement time Site Days   Peripheral IV 12/29/20 22 G 1" Left;Posterior Forearm 12/29/20  2115  Forearm  243   Peripheral IV 08/28/21 22 G Right Antecubital 08/28/21  --  Antecubital  1   Gastrostomy/Enterostomy Gastrostomy;Jejunostomy 22 Fr. LUQ 05/31/21  1639  LUQ  90   External Urinary Catheter 02/15/21  1730  --  195   External Urinary Catheter 08/28/21  2309  --  1   Incision (Closed) 04/30/20 Foot Right 04/30/20  0400  -- 486   Incision (Closed) 10/20/20 Groin Right 10/20/20  1314  -- 313   Incision (Closed) 12/26/20 Leg 12/26/20  0834  -- 246   Pressure Injury 04/30/20 Buttocks Left;Upper Stage 2 -  Partial thickness loss of dermis presenting as a shallow open injury with a red, pink wound bed  without slough. small 1/4cmx1/4cm round pressure wound, pink moist wound bed, no drainage 04/30/20  0400  -- 486   Wound / Incision (Open or Dehisced) 04/30/20 Diabetic ulcer Toe (Comment  which one) Left 2cm x 1cm oval shaped wound, wound bed pink and brown with pink/yellow drainage 04/30/20  0400  Toe (Comment  which one)  486   Wound / Incision (Open or Dehisced) 10/11/20 Diabetic ulcer Foot Left;Lateral 10/11/20  1915  Foot  322   Wound / Incision (Open or Dehisced) 10/11/20 Diabetic ulcer Foot Anterior;Right;Lateral 10/11/20  --  Foot  322   Wound / Incision (Open or Dehisced) 10/11/20 Diabetic ulcer Right;Anterior 10/11/20  1915  --  322   Wound / Incision (Open or Dehisced) 12/27/20 Non-pressure wound Foot Right;Distal;Medial  12/27/20  0600  Foot  245            Intake/Output Last 24 hours  Intake/Output Summary (Last 24 hours) at 08/29/2021 0086 Last data filed at 08/28/2021 2327 Gross per 24 hour  Intake 500 ml  Output --  Net 500 ml    Labs/Imaging Results for orders placed or performed during the hospital encounter of 08/28/21 (from the past 48 hour(s))  CBG monitoring, ED     Status: Abnormal   Collection Time: 08/28/21  9:49 PM  Result Value Ref Range   Glucose-Capillary 107 (H) 70 - 99 mg/dL    Comment: Glucose reference range applies only to samples taken after fasting for at least 8 hours.  Comprehensive metabolic panel     Status: Abnormal   Collection Time: 08/28/21  9:50 PM  Result Value Ref Range   Sodium 141 135 - 145 mmol/L   Potassium 4.1 3.5 - 5.1  mmol/L   Chloride 110 98 - 111 mmol/L   CO2 23 22 - 32 mmol/L   Glucose, Bld 109 (H) 70 - 99 mg/dL    Comment: Glucose reference range applies only to samples taken after fasting for at least 8 hours.   BUN 64 (H) 8 - 23 mg/dL   Creatinine, Ser 2.04 (H) 0.44 - 1.00 mg/dL   Calcium 9.5 8.9 - 10.3 mg/dL   Total Protein 7.1 6.5 - 8.1 g/dL   Albumin 3.2 (L) 3.5 - 5.0 g/dL   AST 42 (H) 15 - 41 U/L   ALT 19 0 -  44 U/L   Alkaline Phosphatase 77 38 - 126 U/L   Total Bilirubin 0.6 0.3 - 1.2 mg/dL   GFR, Estimated 22 (L) >60 mL/min    Comment: (NOTE) Calculated using the CKD-EPI Creatinine Equation (2021)    Anion gap 8 5 - 15    Comment: Performed at [facility], [place] [address] [place], [place] [address]  CBC with Differential     Status: Abnormal   Collection Time: 08/28/21  9:50 PM  Result Value Ref Range   WBC 4.7 4.0 - 10.5 K/uL   RBC 2.77 (L) 3.87 - 5.11 MIL/uL   Hemoglobin 8.9 (L) 12.0 - 15.0 g/dL   HCT 26.8 (L) 36.0 - 46.0 %   MCV 96.8 80.0 - 100.0 fL   MCH 32.1 26.0 - 34.0 pg   MCHC 33.2 30.0 - 36.0 g/dL   RDW 16.1 (H) 11.5 - 15.5 %   Platelets 208 150 - 400 K/uL   nRBC 0.0 0.0 - 0.2 %   Neutrophils Relative % 55 %   Neutro Abs 2.6 1.7 - 7.7 K/uL   Lymphocytes Relative 21 %   Lymphs Abs 1.0 0.7 - 4.0 K/uL   Monocytes Relative 18 %   Monocytes Absolute 0.9 0.1 - 1.0 K/uL   Eosinophils Relative 4 %   Eosinophils Absolute 0.2 0.0 - 0.5 K/uL   Basophils Relative 2 %   Basophils Absolute 0.1 0.0 - 0.1 K/uL   Immature Granulocytes 0 %   Abs Immature Granulocytes 0.02 0.00 - 0.07 K/uL    Comment: Performed at [facility] [address], [place], [place] [address]  Lipase, blood     Status: None   Collection Time: 08/28/21  9:50 PM  Result Value Ref Range   Lipase 21 11 - 51 U/L    Comment: Performed at [facility], [place] [address] [place], [place] [address]  Urinalysis, Routine w reflex microscopic     Status: Abnormal   Collection Time: 08/29/21  2:39 AM  Result Value Ref Range   Color, Urine YELLOW YELLOW   APPearance HAZY (A) CLEAR   Specific Gravity, Urine 1.010 1.005 - 1.030   pH 7.0 5.0 - 8.0   Glucose, UA NEGATIVE NEGATIVE mg/dL   Hgb urine dipstick NEGATIVE NEGATIVE   Bilirubin Urine NEGATIVE NEGATIVE   Ketones, ur NEGATIVE NEGATIVE mg/dL   Protein, ur NEGATIVE NEGATIVE mg/dL   Nitrite NEGATIVE NEGATIVE   Leukocytes,Ua NEGATIVE NEGATIVE     Comment: Performed at [facility] [address] [place], [place] [address]   *Note: Due to a large number of results and/or encounters for the requested time period, some results have not been displayed. A complete set of results can be found in Results Review.   CT ABDOMEN PELVIS WO CONTRAST  Result Date: 08/28/2021 CLINICAL DATA:  Abdominal  pain, acute, nonlocalized EXAM: CT ABDOMEN AND PELVIS WITHOUT CONTRAST TECHNIQUE: Multidetector CT imaging of the abdomen and pelvis was performed following the standard protocol without IV contrast. RADIATION DOSE REDUCTION: This exam was performed according to the departmental dose-optimization program which includes automated exposure control, adjustment of the mA and/or kV according to patient size and/or use of iterative reconstruction technique. COMPARISON:  02/14/2021 FINDINGS: Lower chest: Heavily calcified aorta and visualized coronary arteries. Heart is mildly enlarged. No acute abnormality. Hepatobiliary: Scattered hypodensities in the liver are stable since prior study, likely cysts. Prior cholecystectomy. No biliary ductal dilatation. Pancreas: No focal abnormality or ductal dilatation. Spleen: No focal abnormality.  Normal size. Adrenals/Urinary Tract: No adrenal abnormality. No focal renal abnormality. No stones or hydronephrosis. Urinary bladder is unremarkable. Stomach/Bowel: Paraumbilical ventral hernia noted containing transverse colon. No evidence of bowel obstruction. Gastrojejunostomy tube in place. The retention balloon is in the stomach. The tip is in the proximal jejunum. Stomach and small bowel decompressed, unremarkable. Scattered colonic diverticula. Vascular/Lymphatic: Heavily calcified aorta and iliac vessels. No evidence of aneurysm or adenopathy. Reproductive: Uterus and adnexa unremarkable.  No mass. Other: No free fluid or free air. Adjacent to the paraumbilical hernia containing the transverse colon, there is a left  paraumbilical hernia containing fat. Musculoskeletal: No acute bony abnormality. Degenerative disc disease throughout the lumbar spine. Mild compression fracture at T11 is stable since prior study. IMPRESSION: No acute findings in the abdomen or pelvis. Paraumbilical hernia containing transverse colon. No bowel obstruction. Adjacent separate periumbilical hernia containing fat. Gastrojejunostomy tip is in the jejunum. Aortoiliac atherosclerosis. Electronically Signed   By: Rolm Baptise M.D.   On: 08/28/2021 23:01    Pending Labs Unresulted Labs (From admission, onward)    None       Vitals/Pain Today's Vitals   08/29/21 0400 08/29/21 0415 08/29/21 0430 08/29/21 0433  BP: (!) 145/56 (!) 139/58 (!) 145/52   Pulse: 69 71 71   Resp: '15 16 14   '$ Temp:    98.1 F (36.7 C)  TempSrc:    Oral  SpO2: 100% 100% 100%   Weight:      Height:        Isolation Precautions No active isolations  Medications Medications  lipase/protease/amylase) (VIOKACE) tablets 20,880 Units (has no administration in time range)    And  sodium bicarbonate tablet 650 mg (has no administration in time range)  lactated ringers bolus 500 mL (0 mLs Intravenous Stopped 08/28/21 2327)    Mobility walks with person assist     Focused Assessments Neuro Assessment Handoff:  Swallow screen pass? Yes          Neuro Assessment:   Neuro Checks:      Last Documented NIHSS Modified Score:   Has TPA been given? No If patient is a Neuro Trauma and patient is going to OR before floor call report to [place] nurse: [phone] or [phone]  , Pulmonary Assessment Handoff:  Lung sounds:          R Recommendations: See Admitting Provider Note  Report given to:   Additional Notes: patient is A&OX4. From home (lives with daughter). Awaiting G tube placement.

## 2021-08-29 NOTE — Progress Notes (Signed)
Patient seen and admitted overnight agree with HPI as per Dr. Marlowe [place]     88 year old community dwelling black female Prior complicated GI history with GI bleed-pyloric stenosis + gastrojejunostomy 2017-bleeding 2022 PAF CHA2DS2-VASc >3, has bled >2 no AC CKD 3B COPD/home O2 PVD + L pop plasty 10/20/2020 + transmet amputations 12/2020  Experienced JG tube malfunction-ED discussed with IR Labs notable for AKI BUN/creatinine 64/2.0  CT abdomen negative-periumbilical hernia with transverse colon-no bowel obstruction  AKI 2/2 prerenal azotemia secondary to pyloric stenosis jejunostomy malfunction P A-fib has bled >3 no AC HFpEF DM TY 2 HTN, CAD, PVD  On exam BP 128/68 (BP Location: Left Arm)   Pulse 73   Temp 97.8 F (36.6 C)   Resp 14   Ht '5\' 6"'$  (1.676 m)   Wt 54.4 kg   SpO2 100%   BMI 19.36 kg/m  Eomi ncat frail cachectic blk lady Cta b no added sound no rales no rhonchi no wheeze Abd diet Neuro intact no focal defcit Psych euthymic   Assessment/plan Await IR input-keep n.p.o. till procedure Hydrate saline-NS 75 cc/H Unclear but all home meds have been held?-Post JG tube placement will reorder amlodipine 5, Cymbalta 30, gabapentin 300 twice daily, hydralazine 50 3 times daily, Synthroid 125    Verneita Griffes, MD [facility] 1:58 PM

## 2021-08-29 NOTE — Progress Notes (Signed)
Interventional Radiology Brief Note:  Patient admitted with malfunctioning G-J tube.  States that her duaghter was attempted to flush on Friday when it "burst."    PA to bedside.  Patient has an inverted lure lock connection which cannot connect to standard irrigation syringes.  I was able to flush a small amount of water through the tube which demonstrated a hole in the side wall.  Will bring patient to IR for exchange.   We currently do not stock this type of tube.  Primary team will need to ensure patient has supplies at home for replacement tube.   Brynda Greathouse, MS RD PA-C

## 2021-08-30 ENCOUNTER — Observation Stay (HOSPITAL_COMMUNITY): Payer: Medicare Other

## 2021-08-30 DIAGNOSIS — K9423 Gastrostomy malfunction: Secondary | ICD-10-CM | POA: Diagnosis not present

## 2021-08-30 DIAGNOSIS — N179 Acute kidney failure, unspecified: Secondary | ICD-10-CM | POA: Diagnosis not present

## 2021-08-30 DIAGNOSIS — K9413 Enterostomy malfunction: Secondary | ICD-10-CM | POA: Diagnosis not present

## 2021-08-30 HISTORY — PX: IR GJ TUBE CHANGE: IMG1440

## 2021-08-30 LAB — CBC WITH DIFFERENTIAL/PLATELET
Abs Immature Granulocytes: 0.01 10*3/uL (ref 0.00–0.07)
Basophils Absolute: 0 10*3/uL (ref 0.0–0.1)
Basophils Relative: 1 %
Eosinophils Absolute: 0.2 10*3/uL (ref 0.0–0.5)
Eosinophils Relative: 6 %
HCT: 26.2 % — ABNORMAL LOW (ref 36.0–46.0)
Hemoglobin: 8.8 g/dL — ABNORMAL LOW (ref 12.0–15.0)
Immature Granulocytes: 0 %
Lymphocytes Relative: 18 %
Lymphs Abs: 0.6 10*3/uL — ABNORMAL LOW (ref 0.7–4.0)
MCH: 32.1 pg (ref 26.0–34.0)
MCHC: 33.6 g/dL (ref 30.0–36.0)
MCV: 95.6 fL (ref 80.0–100.0)
Monocytes Absolute: 0.5 10*3/uL (ref 0.1–1.0)
Monocytes Relative: 16 %
Neutro Abs: 2 10*3/uL (ref 1.7–7.7)
Neutrophils Relative %: 59 %
Platelets: 188 10*3/uL (ref 150–400)
RBC: 2.74 MIL/uL — ABNORMAL LOW (ref 3.87–5.11)
RDW: 16.2 % — ABNORMAL HIGH (ref 11.5–15.5)
WBC: 3.3 10*3/uL — ABNORMAL LOW (ref 4.0–10.5)
nRBC: 0 % (ref 0.0–0.2)

## 2021-08-30 LAB — COMPREHENSIVE METABOLIC PANEL
ALT: 20 U/L (ref 0–44)
AST: 38 U/L (ref 15–41)
Albumin: 2.8 g/dL — ABNORMAL LOW (ref 3.5–5.0)
Alkaline Phosphatase: 70 U/L (ref 38–126)
Anion gap: 8 (ref 5–15)
BUN: 45 mg/dL — ABNORMAL HIGH (ref 8–23)
CO2: 22 mmol/L (ref 22–32)
Calcium: 9 mg/dL (ref 8.9–10.3)
Chloride: 107 mmol/L (ref 98–111)
Creatinine, Ser: 1.55 mg/dL — ABNORMAL HIGH (ref 0.44–1.00)
GFR, Estimated: 30 mL/min — ABNORMAL LOW (ref >60)
Glucose, Bld: 175 mg/dL — ABNORMAL HIGH (ref 70–99)
Potassium: 3.4 mmol/L — ABNORMAL LOW (ref 3.5–5.1)
Sodium: 137 mmol/L (ref 135–145)
Total Bilirubin: 0.5 mg/dL (ref 0.3–1.2)
Total Protein: 6.4 g/dL — ABNORMAL LOW (ref 6.5–8.1)

## 2021-08-30 LAB — GLUCOSE, CAPILLARY: Glucose-Capillary: 137 mg/dL — ABNORMAL HIGH (ref 70–99)

## 2021-08-30 MED ORDER — LIDOCAINE VISCOUS HCL 2 % MT SOLN
OROMUCOSAL | Status: AC
  Filled 2021-08-30: qty 15

## 2021-08-30 MED ORDER — IOHEXOL 300 MG/ML  SOLN
100.0000 mL | Freq: Once | INTRAMUSCULAR | Status: AC | PRN
  Administered 2021-08-30: 10 mL

## 2021-08-30 NOTE — Procedures (Signed)
Interventional Radiology Procedure Note  Procedure: GJ tube upsize (20 fr to 24 fr)  Indication: Leaking around [place] tube insertion site.  Findings: Please refer to procedural dictation for full description.  Complications: None  EBL: < 10 mL  Miachel Roux, MD [phone]

## 2021-08-30 NOTE — Progress Notes (Signed)
Mobility Specialist Progress Note:   08/30/21 1200  Mobility  Activity Ambulated with assistance in hallway  Level of Assistance Standby assist, set-up cues, supervision of patient - no hands on  Assistive Device Front wheel walker  Distance Ambulated (ft) 150 ft  Activity Response Tolerated well  $Mobility charge 1 Mobility   Pt agreeable to mobility session. Required minG assist throughout. Pt with no unsteadiness/LOB with gait. Left in chair with chair alarm on.   Nelta Numbers Acute Rehab Secure Chat or Office Phone: [phone]

## 2021-08-30 NOTE — Discharge Summary (Signed)
Physician Discharge Summary  EMUNAH ARON ZOX:[id number] DOB: [birth year] DOA: 08/28/2021  PCP: Sharon Seller, NP  Admit date: 08/28/2021 Discharge date: 08/30/2021  Time spent: 37 minutes  Recommendations for Outpatient Follow-up:  Require CBC Chem-12 1 week Requires wound care to left lower extremity as per podiatry/wound care physician as needed  Discharge Diagnoses:  MAIN problem for hospitalization   JG tube displaced  Please see below for itemized issues addressed in HOpsital- refer to other progress notes for clarity if needed  Discharge Condition: Improved  Diet recommendation: Heart healthy  Filed Weights   08/28/21 2149 08/29/21 0553 08/30/21 0945  Weight: 55.8 kg 54.4 kg 59.2 kg    History of present illness:  88 year old community dwelling black female Prior complicated GI history with GI bleed-pyloric stenosis + gastrojejunostomy 2017-bleeding 2022 PAF CHA2DS2-VASc >3, has bled >2 no AC CKD 3B COPD/home O2 PVD + L pop plasty 10/20/2020 + transmet amputations 12/2020  Experienced JG tube malfunction-ED discussed with IR Labs notable for AKI BUN/creatinine 64/2.0  CT abdomen negative-periumbilical hernia with transverse colon-no bowel obstruction   Was admitted because her GJ tube was displaced at home and could not get this replaced-usually gets exchanged every 6 to 8 weeks at [facility]  She experienced AKI on admission from lack of nutrition only drinks plain water Jell-O and occasional liquids but gets majority of her feeds in the GJ tube  She seems to have improved with volume repletion her AKI is better GJ tube was manipulated and adjusted by interventional radiology and patient was stabilized on day of discharge 5/30   Discharge Exam: Vitals:   08/30/21 0500 08/30/21 0755  BP: (!) 122/45 (!) 155/57  Pulse: 76 75  Resp: 18 18  Temp: 98.1 F (36.7 C) 97.7 F (36.5 C)  SpO2: 99% 100%    Subj on day of d/c   Awake coherent  pleasant no distress no chest pain no fever Eating drinking  General Exam on discharge  EOMI NCAT quite frail and cachectic No icterus no pallor S1-S2 no murmur no rub no gallop ROM is intact Abdomen is soft with tube in place in the left lower quadrant Neurologic.  No focal  Discharge Instructions   Discharge Instructions     Diet - low sodium heart healthy   Complete by: As directed    Discharge instructions   Complete by: As directed    Please Follow-up with radiology in the outpatient setting for your tube Get labs in a week the outpatient setting with your primary care physician Recommend close follow-up with wound care for your wound on your foot   Discharge wound care:   Complete by: As directed    Wound care as per above   Increase activity slowly   Complete by: As directed       Allergies as of 08/30/2021       Reactions   Penicillins Itching, Rash, Other (See Comments)   Tolerated amoxicillin, unasyn, zosyn & cephalosporins in the past. Did it involve swelling of the face/tongue/throat, SOB, or low BP? No Did it involve sudden or severe rash/hives, skin peeling, or any reaction on the inside of your mouth or nose? No Did you need to seek medical attention at a hospital or doctor's office? Unknown When did it last happen?      5+ years If all above answers are "NO", may proceed with cephalosporin use.        Medication List  TAKE these medications    acetaminophen 500 MG tablet Commonly known as: TYLENOL Take 500 mg by mouth every 6 (six) hours as needed for moderate pain.   amLODipine 5 MG tablet Commonly known as: NORVASC TAKE 1 TABLET BY MOUTH EVERY DAY   aspirin EC 81 MG tablet Take 1 tablet (81 mg total) by mouth daily. Swallow whole.   atorvastatin 10 MG tablet Commonly known as: LIPITOR TAKE 1 TABLET BY MOUTH EVERY DAY   b complex vitamins tablet Take 1 tablet by mouth daily.   cholecalciferol 25 MCG (1000 UNIT) tablet Commonly  known as: VITAMIN D3 Take 1,000 Units by mouth daily.   cycloSPORINE 0.05 % ophthalmic emulsion Commonly known as: Restasis USE 1 DROP INTO BOTH EYES TWICE DAILY What changed:  how much to take how to take this when to take this additional instructions   doxycycline 100 MG tablet Commonly known as: VIBRA-TABS TAKE 1 TABLET BY MOUTH TWICE A DAY What changed: additional instructions   Dulera 200-5 MCG/ACT Aero Generic drug: mometasone-formoterol INHALE TWO PUFFS BY MOUTH TWICE DAILY What changed:  how much to take how to take this when to take this additional instructions   DULoxetine 30 MG capsule Commonly known as: CYMBALTA TAKE 1 CAPSULE BY MOUTH EVERY DAY What changed:  how much to take how to take this   EQL PROBIOTIC COLON SUPPORT PO Take 1 capsule by mouth daily.   feeding supplement (OSMOLITE 1.5 CAL) Liqd Place 711 mLs into feeding tube See admin instructions. Take 711 ml over a 12 hour period per day 9pm-9am at a rate of 65   ferrous sulfate 220 (44 Fe) MG/5ML solution TAKE 7.4 MLS (325 MG TOTAL) BY MOUTH DAILY. What changed:  how much to take how to take this   fluticasone 50 MCG/ACT nasal spray Commonly known as: FLONASE 2 sprays into each nostril daily. What changed:  how much to take how to take this when to take this additional instructions   free water Soln Place 180 mLs into feeding tube 3 (three) times daily.   furosemide 40 MG tablet Commonly known as: LASIX TAKE 0.5-1 TABLETS (20-40 MG TOTAL) BY MOUTH SEE ADMIN INSTRUCTIONS. TAKE 20 MG DAILY, MAY INSTEAD TAKE 40 MG DAILY AS NEEDED FOR EDEMA   gabapentin 300 MG capsule Commonly known as: NEURONTIN TAKE 1 CAPSULE BY MOUTH TWICE A DAY   gentamicin cream 0.1 % Commonly known as: GARAMYCIN Apply 1 application topically daily as needed (dry skin).   hydrALAZINE 50 MG tablet Commonly known as: APRESOLINE Take 1 tablet (50 mg total) by mouth 3 (three) times daily.   Insulin Pen Needle  31G X 5 MM Misc Commonly known as: B-D UF III MINI PEN NEEDLES Use twice daily with giving insulin injections. Dx E11.9   latanoprost 0.005 % ophthalmic solution Commonly known as: XALATAN Place 1 drop into both eyes at bedtime.   levothyroxine 125 MCG tablet Commonly known as: SYNTHROID TAKE 1 TABLET BY MOUTH EVERY DAY What changed: when to take this   liver oil-zinc oxide 40 % ointment Commonly known as: DESITIN Apply topically 2 (two) times daily. What changed:  how much to take when to take this reasons to take this   loratadine 10 MG tablet Commonly known as: CLARITIN Take 10 mg by mouth daily.   multivitamin with minerals Tabs tablet Take 1 tablet by mouth daily.   NETI POT SINUS WASH NA Place 1 application into the nose at bedtime.   nystatin  cream Commonly known as: MYCOSTATIN APPLY TO AFFECTED AREA TWICE A DAY What changed: See the new instructions.   pantoprazole 40 MG tablet Commonly known as: PROTONIX Take 1 tablet (40 mg total) by mouth 2 (two) times daily.   polyethylene glycol powder 17 GM/SCOOP powder Commonly known as: GLYCOLAX/MIRALAX Place 17 g into feeding tube at bedtime.   senna-docusate 8.6-50 MG tablet Commonly known as: Senokot-S Take 1 tablet by mouth at bedtime as needed for mild constipation.   Systane 0.4-0.3 % Gel ophthalmic gel Generic drug: Polyethyl Glycol-Propyl Glycol Place 1 application into both eyes 2 (two) times daily as needed (moisture).   Systane Nighttime Oint Place 1 application into both eyes at bedtime.   traMADol 50 MG tablet Commonly known as: ULTRAM Place 1 tablet (50 mg total) into feeding tube every 6 (six) hours as needed for severe pain.               Discharge Care Instructions  (From admission, onward)           Start     Ordered   08/30/21 0000  Discharge wound care:       Comments: Wound care as per above   08/30/21 1144           Allergies  Allergen Reactions   Penicillins  Itching, Rash and Other (See Comments)    Tolerated amoxicillin, unasyn, zosyn & cephalosporins in the past. Did it involve swelling of the face/tongue/throat, SOB, or low BP? No Did it involve sudden or severe rash/hives, skin peeling, or any reaction on the inside of your mouth or nose? No Did you need to seek medical attention at a hospital or doctor's office? Unknown When did it last happen?      5+ years If all above answers are "NO", may proceed with cephalosporin use.       The results of significant diagnostics from this hospitalization (including imaging, microbiology, ancillary and laboratory) are listed below for reference.    Significant Diagnostic Studies: CT ABDOMEN PELVIS WO CONTRAST  Result Date: 08/28/2021 CLINICAL DATA:  Abdominal pain, acute, nonlocalized EXAM: CT ABDOMEN AND PELVIS WITHOUT CONTRAST TECHNIQUE: Multidetector CT imaging of the abdomen and pelvis was performed following the standard protocol without IV contrast. RADIATION DOSE REDUCTION: This exam was performed according to the departmental dose-optimization program which includes automated exposure control, adjustment of the mA and/or kV according to patient size and/or use of iterative reconstruction technique. COMPARISON:  02/14/2021 FINDINGS: Lower chest: Heavily calcified aorta and visualized coronary arteries. Heart is mildly enlarged. No acute abnormality. Hepatobiliary: Scattered hypodensities in the liver are stable since prior study, likely cysts. Prior cholecystectomy. No biliary ductal dilatation. Pancreas: No focal abnormality or ductal dilatation. Spleen: No focal abnormality.  Normal size. Adrenals/Urinary Tract: No adrenal abnormality. No focal renal abnormality. No stones or hydronephrosis. Urinary bladder is unremarkable. Stomach/Bowel: Paraumbilical ventral hernia noted containing transverse colon. No evidence of bowel obstruction. Gastrojejunostomy tube in place. The retention balloon is in the  stomach. The tip is in the proximal jejunum. Stomach and small bowel decompressed, unremarkable. Scattered colonic diverticula. Vascular/Lymphatic: Heavily calcified aorta and iliac vessels. No evidence of aneurysm or adenopathy. Reproductive: Uterus and adnexa unremarkable.  No mass. Other: No free fluid or free air. Adjacent to the paraumbilical hernia containing the transverse colon, there is a left paraumbilical hernia containing fat. Musculoskeletal: No acute bony abnormality. Degenerative disc disease throughout the lumbar spine. Mild compression fracture at T11 is stable since prior study.  IMPRESSION: No acute findings in the abdomen or pelvis. Paraumbilical hernia containing transverse colon. No bowel obstruction. Adjacent separate periumbilical hernia containing fat. Gastrojejunostomy tip is in the jejunum. Aortoiliac atherosclerosis. Electronically Signed   By: Charlett Nose M.D.   On: 08/28/2021 23:01   IR GJ Tube Change  Result Date: 08/29/2021 INDICATION: Malfunctioning gastrojejunostomy tube EXAM: Replacement of gastrojejunostomy tube using fluoroscopic guidance MEDICATIONS: None ANESTHESIA/SEDATION: None CONTRAST:  5 mL-administered into the gastric lumen. FLUOROSCOPY: Radiation Exposure Index (as provided by the fluoroscopic device): 1.0 minutes (2 mGy) COMPLICATIONS: None immediate. PROCEDURE: Informed written consent was obtained from the patient after a thorough discussion of the procedural risks, benefits and alternatives. All questions were addressed. Maximal Sterile Barrier Technique was utilized including caps, mask, sterile gowns, sterile gloves, sterile drape, hand hygiene and skin antiseptic. A timeout was performed prior to the initiation of the procedure. Patient was placed supine on the exam table. Initial examination of the abdomen demonstrated appropriate positioning of the existing gastrojejunostomy catheter. Decision was made to proceed with exchange due to reports malfunctioning  leakage. Over a stiff glidewire, the existing gastrojejunostomy catheter was removed after deflating the retention balloon. Over this wire, a new 20 French balloon retention gastrojejunostomy catheter was advanced such that the distal tip terminating in the proximal small bowel. Injection of contrast material confirmed location past the ligament of Treitz. Retention balloon was inflated with approximately 10 mL of sterile water and brought back to the gastric wall. The external bumper was brought down to the skin. Both lumens were flushed with water, and a clean dressing was placed. The patient tolerated the procedure well without immediate complication. IMPRESSION: Successful exchange of malfunctioning gastrojejunostomy tube for a new 20 French gastrojejunostomy tube. Note that the patient's exact 33 French EnFit tube was not available due to inventory supply. The patient may need to be scheduled for additional exchange if needed. Electronically Signed   By: Olive Bass M.D.   On: 08/29/2021 10:53   DG CHEST PORT 1 VIEW  Result Date: 08/29/2021 CLINICAL DATA:  Shortness of breath EXAM: PORTABLE CHEST 1 VIEW COMPARISON:  Radiograph 12/28/2020 FINDINGS: Unchanged cardiomediastinal silhouette. Aortic arch calcifications. There is no focal airspace consolidation. There is no pleural effusion. No pneumothorax. There is no acute osseous abnormality. Right upper quadrant surgical clips. Percutaneous gastrostomy tube overlies the stomach. Bilateral shoulder degenerative changes. Thoracic spondylosis. IMPRESSION: No evidence of acute cardiopulmonary disease. Electronically Signed   By: Caprice Renshaw M.D.   On: 08/29/2021 09:13    Microbiology: No results found for this or any previous visit (from the past 240 hour(s)).   Labs: Basic Metabolic Panel: Recent Labs  Lab 08/28/21 2150 08/29/21 0717 08/30/21 0031  NA 141 138 137  K 4.1 3.7 3.4*  CL 110 108 107  CO2 23 25 22   GLUCOSE 109* 104* 175*  BUN 64*  56* 45*  CREATININE 2.04* 1.69* 1.55*  CALCIUM 9.5 9.4 9.0   Liver Function Tests: Recent Labs  Lab 08/28/21 2150 08/30/21 0031  AST 42* 38  ALT 19 20  ALKPHOS 77 70  BILITOT 0.6 0.5  PROT 7.1 6.4*  ALBUMIN 3.2* 2.8*   Recent Labs  Lab 08/28/21 2150  LIPASE 21   No results for input(s): AMMONIA in the last 168 hours. CBC: Recent Labs  Lab 08/28/21 2150 08/29/21 0717 08/30/21 0031  WBC 4.7 4.4 3.3*  NEUTROABS 2.6  --  2.0  HGB 8.9* 8.9* 8.8*  HCT 26.8* 27.1* 26.2*  MCV 96.8  97.1 95.6  PLT 208 198 188   Cardiac Enzymes: No results for input(s): CKTOTAL, CKMB, CKMBINDEX, TROPONINI in the last 168 hours. BNP: BNP (last 3 results) Recent Labs    08/29/21 0717  BNP 195.8*    ProBNP (last 3 results) Recent Labs    05/04/21 1502  PROBNP 769*    CBG: Recent Labs  Lab 08/28/21 2149 08/29/21 1145 08/29/21 2035 08/30/21 0756  GLUCAP 107* 76 70 137*       Signed:  Rhetta Mura MD   [facility] 08/30/2021, 11:45 AM

## 2021-08-30 NOTE — Progress Notes (Signed)
Shannon [name] to be D/C'd  per MD order.  Discussed with the patient and all questions fully answered.  VSS, Skin clean, dry and intact without evidence of skin break down, no evidence of skin tears noted.  IV catheter discontinued intact. Site without signs and symptoms of complications. Dressing and pressure applied.  An After Visit Summary was printed and given to the patient.   D/c re-educated completed with patient/family including follow up instructions, medication list, d/c activities limitations if indicated, with other d/c instructions as indicated by MD - patient able to verbalize understanding, all questions fully answered.   Patient instructed to return to ED, call 911, or call MD for any changes in condition.   Patient to be escorted via [place], and D/C home via private auto.

## 2021-08-30 NOTE — Progress Notes (Signed)
Pt started on tube feedings of osmolite 1.5 at 66m hr for a total of 711. Pt tolerated tube feeding as of now.  NLouanne Skye12:19 AM 08/30/21

## 2021-09-01 ENCOUNTER — Other Ambulatory Visit: Payer: Self-pay | Admitting: Nurse Practitioner

## 2021-09-01 ENCOUNTER — Other Ambulatory Visit (HOSPITAL_COMMUNITY): Payer: Self-pay | Admitting: Interventional Radiology

## 2021-09-01 DIAGNOSIS — R633 Feeding difficulties, unspecified: Secondary | ICD-10-CM

## 2021-09-02 ENCOUNTER — Telehealth: Payer: Self-pay | Admitting: *Deleted

## 2021-09-02 DIAGNOSIS — E1142 Type 2 diabetes mellitus with diabetic polyneuropathy: Secondary | ICD-10-CM | POA: Diagnosis not present

## 2021-09-02 DIAGNOSIS — E1169 Type 2 diabetes mellitus with other specified complication: Secondary | ICD-10-CM | POA: Diagnosis not present

## 2021-09-02 DIAGNOSIS — M86172 Other acute osteomyelitis, left ankle and foot: Secondary | ICD-10-CM | POA: Diagnosis not present

## 2021-09-02 DIAGNOSIS — E039 Hypothyroidism, unspecified: Secondary | ICD-10-CM | POA: Diagnosis not present

## 2021-09-02 DIAGNOSIS — E1151 Type 2 diabetes mellitus with diabetic peripheral angiopathy without gangrene: Secondary | ICD-10-CM | POA: Diagnosis not present

## 2021-09-02 DIAGNOSIS — J9610 Chronic respiratory failure, unspecified whether with hypoxia or hypercapnia: Secondary | ICD-10-CM | POA: Diagnosis not present

## 2021-09-02 NOTE — Telephone Encounter (Signed)
Received fax, CMN,  from [facility] for patient's Supplement Protein Prosource Unflavored 30oz 1/ea  Placed order in [employer] folder to review and sign.  To be faxed back to Fax: [phone]  Direct Line to [employer] [phone] option 2.

## 2021-09-05 ENCOUNTER — Ambulatory Visit (HOSPITAL_COMMUNITY)
Admission: RE | Admit: 2021-09-05 | Discharge: 2021-09-05 | Disposition: A | Discharge status: Home / Self Care | Payer: Medicare Other | Source: Ambulatory Visit | Attending: Interventional Radiology | Admitting: Interventional Radiology

## 2021-09-05 DIAGNOSIS — K9413 Enterostomy malfunction: Secondary | ICD-10-CM | POA: Diagnosis not present

## 2021-09-05 DIAGNOSIS — Z431 Encounter for attention to gastrostomy: Secondary | ICD-10-CM | POA: Insufficient documentation

## 2021-09-05 DIAGNOSIS — R633 Feeding difficulties, unspecified: Secondary | ICD-10-CM | POA: Diagnosis not present

## 2021-09-05 HISTORY — PX: IR GJ TUBE CHANGE: IMG1440

## 2021-09-05 MED ORDER — IOHEXOL 300 MG/ML  SOLN
25.0000 mL | Freq: Once | INTRAMUSCULAR | Status: AC | PRN
  Administered 2021-09-05: 15 mL

## 2021-09-05 MED ORDER — LIDOCAINE VISCOUS HCL 2 % MT SOLN
OROMUCOSAL | Status: AC
  Filled 2021-09-05: qty 15

## 2021-09-05 NOTE — Procedures (Signed)
Vascular and Interventional Radiology Procedure Note  Patient: Shannon [name] DOB: Jan 11, 1924 Medical Record Number: [id number] Note Date/Time: 09/05/21 4:24 PM   Performing Physician: Michaelle Birks, MD Assistant(s): None  Diagnosis: Catheter malfunction. and Leaking catheter.  Procedure: GASTROJEJUNOSTOMY TUBE EXCHANGE  Anesthesia: Local Anesthetic Complications: None Estimated Blood Loss:  0 mL  Findings:  Successful exchange for a new, different hub type 52F gastrojejunostomy tube under fluoroscopy.  Plan: Return to VIR for routine GJ exchange in 6 months, or sooner if warranted.  See detailed procedure note with images in PACS. The patient tolerated the procedure well without incident or complication and was returned to Recovery in stable condition.    Michaelle Birks, MD [facility]   Pager. [place]

## 2021-09-07 ENCOUNTER — Ambulatory Visit (INDEPENDENT_AMBULATORY_CARE_PROVIDER_SITE_OTHER): Payer: Medicare Other | Admitting: Podiatry

## 2021-09-07 DIAGNOSIS — E0859 Diabetes mellitus due to underlying condition with other circulatory complications: Secondary | ICD-10-CM | POA: Diagnosis not present

## 2021-09-07 DIAGNOSIS — L97512 Non-pressure chronic ulcer of other part of right foot with fat layer exposed: Secondary | ICD-10-CM

## 2021-09-07 DIAGNOSIS — M86671 Other chronic osteomyelitis, right ankle and foot: Secondary | ICD-10-CM

## 2021-09-08 ENCOUNTER — Other Ambulatory Visit: Payer: Self-pay | Admitting: Nurse Practitioner

## 2021-09-08 DIAGNOSIS — K921 Melena: Secondary | ICD-10-CM

## 2021-09-08 DIAGNOSIS — E039 Hypothyroidism, unspecified: Secondary | ICD-10-CM | POA: Diagnosis not present

## 2021-09-08 DIAGNOSIS — D638 Anemia in other chronic diseases classified elsewhere: Secondary | ICD-10-CM

## 2021-09-08 DIAGNOSIS — M86172 Other acute osteomyelitis, left ankle and foot: Secondary | ICD-10-CM | POA: Diagnosis not present

## 2021-09-08 DIAGNOSIS — E1169 Type 2 diabetes mellitus with other specified complication: Secondary | ICD-10-CM | POA: Diagnosis not present

## 2021-09-08 DIAGNOSIS — E1151 Type 2 diabetes mellitus with diabetic peripheral angiopathy without gangrene: Secondary | ICD-10-CM | POA: Diagnosis not present

## 2021-09-08 DIAGNOSIS — E1142 Type 2 diabetes mellitus with diabetic polyneuropathy: Secondary | ICD-10-CM | POA: Diagnosis not present

## 2021-09-08 DIAGNOSIS — J9610 Chronic respiratory failure, unspecified whether with hypoxia or hypercapnia: Secondary | ICD-10-CM | POA: Diagnosis not present

## 2021-09-08 MED ORDER — PANTOPRAZOLE SODIUM 40 MG PO TBEC: 40.0000 mg | DELAYED_RELEASE_TABLET | Freq: Two times a day (BID) | ORAL | 2 refills | Status: DC

## 2021-09-08 NOTE — Telephone Encounter (Signed)
Pharmacy and [name] requested rx's.

## 2021-09-09 ENCOUNTER — Ambulatory Visit: Payer: Medicare Other | Admitting: Nurse Practitioner

## 2021-09-09 NOTE — Patient Instructions (Signed)
Please contact your local pharmacy, previous provider, or insurance carrier for vaccine/immunization records. Ensure that any procedures done outside of [facility] are faxed to [place] [phone] or you can sign release of records form at the front desk to keep your medical record updated.     Stop zyrtec and try xyzal 5 mg daily  Increase netipot/nasal wash to twice daily Avoid forcefully blowing nose.

## 2021-09-12 ENCOUNTER — Ambulatory Visit (INDEPENDENT_AMBULATORY_CARE_PROVIDER_SITE_OTHER): Payer: Medicare Other | Admitting: Nurse Practitioner

## 2021-09-12 ENCOUNTER — Encounter: Payer: Self-pay | Admitting: Nurse Practitioner

## 2021-09-12 VITALS — BP 100/70 | HR 77 | Temp 97.8°F | Resp 16 | Ht 66.0 in | Wt 124.2 lb

## 2021-09-12 DIAGNOSIS — I70245 Atherosclerosis of native arteries of left leg with ulceration of other part of foot: Secondary | ICD-10-CM

## 2021-09-12 DIAGNOSIS — N184 Chronic kidney disease, stage 4 (severe): Secondary | ICD-10-CM

## 2021-09-12 DIAGNOSIS — K9423 Gastrostomy malfunction: Secondary | ICD-10-CM

## 2021-09-12 DIAGNOSIS — M868X7 Other osteomyelitis, ankle and foot: Secondary | ICD-10-CM | POA: Diagnosis not present

## 2021-09-12 DIAGNOSIS — E039 Hypothyroidism, unspecified: Secondary | ICD-10-CM | POA: Diagnosis not present

## 2021-09-12 DIAGNOSIS — D500 Iron deficiency anemia secondary to blood loss (chronic): Secondary | ICD-10-CM

## 2021-09-12 DIAGNOSIS — R0981 Nasal congestion: Secondary | ICD-10-CM | POA: Diagnosis not present

## 2021-09-12 DIAGNOSIS — M545 Low back pain, unspecified: Secondary | ICD-10-CM

## 2021-09-12 DIAGNOSIS — I5032 Chronic diastolic (congestive) heart failure: Secondary | ICD-10-CM

## 2021-09-12 DIAGNOSIS — E1142 Type 2 diabetes mellitus with diabetic polyneuropathy: Secondary | ICD-10-CM

## 2021-09-12 DIAGNOSIS — G8929 Other chronic pain: Secondary | ICD-10-CM

## 2021-09-12 DIAGNOSIS — I10 Essential (primary) hypertension: Secondary | ICD-10-CM

## 2021-09-12 NOTE — Progress Notes (Signed)
Careteam: Patient Care Team: [name], NP as PCP - General (Geriatric Medicine) Dorothy Spark, MD as PCP - Cardiology (Cardiology) Edrick Kins, DPM as Consulting Physician (Podiatry) Dorothy Spark, MD as Consulting Physician (Cardiology) [name] [employer], NP as Nurse Practitioner (Geriatric Medicine) Camillo Flaming, [place] as Referring Physician (Optometry) [name], RN as [place] Management  PLACE OF SERVICE:  [place] Directive information Does Patient Have a Medical Advance Directive?: Yes, Type of Advance Directive: Healthcare Power of [place];Living will;Out of facility DNR (pink MOST or yellow form), Does patient want to make changes to medical advance directive?: No - Patient declined  Allergies  Allergen Reactions   Penicillins Itching, Rash and Other (See Comments)    Tolerated amoxicillin, unasyn, zosyn & cephalosporins in the past. Did it involve swelling of the face/tongue/throat, SOB, or low BP? No Did it involve sudden or severe rash/hives, skin peeling, or any reaction on the inside of your mouth or nose? No Did you need to seek medical attention at a hospital or doctor's office? Unknown When did it last happen?      5+ years If all above answers are "NO", may proceed with cephalosporin use.     Chief Complaint  Patient presents with   Medical Management of Chronic Issues    3 month follow up.   Health Maintenance    Discuss the need for Foot exam   Immunizations    Discuss the need for Shingrix vaccine, and Covid Booster.      HPI: Patient is a 88 y.o. female for routine follo wup  She was in the hospital last month due to [place] malfunction. It was dislodged at home and unable to get replaced, she had AKI due to lack of nutrition. She had tube replaced. Continues to be sore and have drainage.   Continues with right foot ulcer and follow up with podiaty- on continues doxycycline  She sees him every  6 weeks. Followed by vascular fo PAD every months. Contiues on statin and asa  Mobility has become less. She does walk with walker today.   Neuropathy- stable on cymbalta, will occasionally have flares of worsening pain.   COPD- stable, no recent exacerbations   A fib- rate controlled, no longer on anticoagulation due to recurrent GI bleeds.   Continues to have allergies, nasal congestion- blows nose frequently, using antihistamine.   Review of Systems:  Review of Systems  Constitutional:  Negative for chills, fever and weight loss.  HENT:  Positive for congestion. Negative for tinnitus.   Respiratory:  Negative for cough, sputum production and shortness of breath.   Cardiovascular:  Negative for chest pain, palpitations and leg swelling.  Gastrointestinal:  Negative for abdominal pain, constipation, diarrhea and heartburn.  Genitourinary:  Negative for dysuria, frequency and urgency.  Musculoskeletal:  Negative for back pain, falls, joint pain and myalgias.  Skin: Negative.   Neurological:  Positive for tingling. Negative for dizziness and headaches.  Endo/Heme/Allergies:  Positive for environmental allergies.  Psychiatric/Behavioral:  Negative for depression and memory loss. The patient does not have insomnia.     Past Medical History:  Diagnosis Date   Acute respiratory failure ([place])    Anemia    previous blood transfusions   Arthritis    "all over"   Asthma    Atrial flutter (HCC)    Bradycardia    requiring previous d/c of BB and reduction of amiodarone   CAD (coronary  artery disease)    nonobstructive per notes   Chronic diastolic CHF (congestive heart failure) ([place])    CKD (chronic kidney disease), stage III ([place])    Complication of blood transfusion    "got the wrong blood type at [facility] in ~ 2015; no adverse reaction that we are aware of"/daughter, [name] (01/27/2016)   COPD (chronic obstructive pulmonary disease) ([place])    Depression    "light case"   DVT  (deep venous thrombosis) ([place]) 01/2016   a. LLE DVT 01/2016 - switched from Eliquis to Coumadin.   Dyspnea    with some activity   Gastric stenosis    a. s/p stomach tube   GERD (gastroesophageal reflux disease)    GI bleed    Headache    History of 2019 novel coronavirus disease (COVID-19)    History of blood transfusion    "several" (01/27/2016)   History of stomach ulcers    Hyperlipidemia    Hypertension    Hypothyroidism    On home oxygen therapy    "2L; 9PM til 9AM" (06/27/2017)   PAD (peripheral artery disease) (HCC)    a. prior gangrene, toe amputations, intervention.   PAF (paroxysmal atrial fibrillation) ([place])    Paraesophageal hernia    Perforated gastric ulcer ([place])    Pneumonia    "a few times" (06/27/2017)   Seasonal allergies    SIADH (syndrome of inappropriate ADH production) ([place])    [name] 01/10/2015   Small bowel obstruction ([place])    "I don't know how many" (01/11/2015)   Stroke ([place])    several- no residual   Type II diabetes mellitus ([place])    "related to prednisone use  for > 20 yr; once predinose stopped; no more DM RX" (01/27/2016)   UTI (urinary tract infection) 02/08/2016   Ventral hernia with bowel obstruction    Past Surgical History:  Procedure Laterality Date   ABDOMINAL AORTOGRAM N/A 09/21/2016   Procedure: Abdominal Aortogram;  Surgeon: Waynetta Sandy, MD;  Location: [facility];  Service: Cardiovascular;  Laterality: N/A;   ABDOMINAL AORTOGRAM W/LOWER EXTREMITY N/A 10/20/2020   Procedure: ABDOMINAL AORTOGRAM W/LOWER EXTREMITY;  Surgeon: Cherre Robins, MD;  Location: [facility];  Service: Cardiovascular;  Laterality: N/A;   AMPUTATION Left 09/25/2016   Procedure: AMPUTATION DIGIT- LEFT 2ND AND 3RD TOES;  Surgeon: Rosetta Posner, MD;  Location: [facility];  Service: Vascular;  Laterality: Left;   AMPUTATION Right 05/03/2018   Procedure: AMPUTATION RIGHT GREAT TOE;  Surgeon: Rosetta Posner, MD;  Location: [facility];  Service:  Vascular;  Laterality: Right;   CATARACT EXTRACTION W/ INTRAOCULAR LENS  IMPLANT, BILATERAL     CHOLECYSTECTOMY OPEN     COLECTOMY     ESOPHAGOGASTRODUODENOSCOPY N/A 01/19/2014   Procedure: ESOPHAGOGASTRODUODENOSCOPY (EGD);  Surgeon: Irene Shipper, MD;  Location: [facility];  Service: Endoscopy;  Laterality: N/A;   ESOPHAGOGASTRODUODENOSCOPY N/A 01/20/2014   Procedure: ESOPHAGOGASTRODUODENOSCOPY (EGD);  Surgeon: Irene Shipper, MD;  Location: [facility];  Service: Endoscopy;  Laterality: N/A;   ESOPHAGOGASTRODUODENOSCOPY N/A 03/19/2014   Procedure: ESOPHAGOGASTRODUODENOSCOPY (EGD);  Surgeon: Milus Banister, MD;  Location: [facility];  Service: Endoscopy;  Laterality: N/A;   ESOPHAGOGASTRODUODENOSCOPY N/A 07/08/2015   Procedure: ESOPHAGOGASTRODUODENOSCOPY (EGD);  Surgeon: Doran Stabler, MD;  Location: [facility];  Service: Endoscopy;  Laterality: N/A;   ESOPHAGOGASTRODUODENOSCOPY (EGD) WITH PROPOFOL N/A 09/15/2015   Procedure: ESOPHAGOGASTRODUODENOSCOPY (EGD) WITH PROPOFOL;  Surgeon: Manus Gunning, MD;  Location:  [facility];  Service: Gastroenterology;  Laterality: N/A;   ESOPHAGOGASTRODUODENOSCOPY (EGD) WITH PROPOFOL N/A 10/12/2020   Procedure: ESOPHAGOGASTRODUODENOSCOPY (EGD) WITH PROPOFOL;  Surgeon: Gatha Mayer, MD;  Location: [facility];  Service: Endoscopy;  Laterality: N/A;   FOOT SURGERY     GASTROJEJUNOSTOMY     hx/notes 01/10/2015   GASTROJEJUNOSTOMY N/A 09/23/2015   Procedure: OPEN GASTROJEJUNOSTOMY TUBE PLACEMENT;  Surgeon: Arta Bruce Kinsinger, MD;  Location: [facility];  Service: General;  Laterality: N/A;   GLAUCOMA SURGERY Bilateral    HEMATOMA EVACUATION Right 10/20/2020   Procedure: EVACUATION HEMATOMA RIGHT GROIN;  Surgeon: Cherre Robins, MD;  Location: [facility];  Service: Vascular;  Laterality: Right;   IR CM INJ ANY COLONIC TUBE W/FLUORO  01/05/2017   IR CM INJ ANY COLONIC TUBE W/FLUORO  06/07/2017   IR CM INJ ANY COLONIC TUBE W/FLUORO  11/05/2017    IR CM INJ ANY COLONIC TUBE W/FLUORO  09/18/2018   IR CM INJ ANY COLONIC TUBE W/FLUORO  03/06/2019   IR CM INJ ANY COLONIC TUBE W/FLUORO  03/11/2019   IR GASTR TUBE CONVERT GASTR-JEJ PER W/FL MOD SED  04/30/2020   IR GENERIC HISTORICAL  01/07/2016   IR GJ TUBE CHANGE 01/07/2016 Jacqulynn Cadet, MD [facility]   IR GENERIC HISTORICAL  01/27/2016   IR MECH REMOV OBSTRUC MAT ANY COLON TUBE W/FLUORO 01/27/2016 Markus Daft, MD [facility]   IR GENERIC HISTORICAL  02/07/2016   IR PATIENT EVAL TECH 0-60 MINS Darrell K Allred, PA-C [facility]   IR GENERIC HISTORICAL  02/08/2016   IR GJ TUBE CHANGE 02/08/2016 Greggory Keen, MD [facility]   IR GENERIC HISTORICAL  01/06/2016   IR [place] TUBE CHANGE 01/06/2016 [facility] OUT REF   IR GENERIC HISTORICAL  05/02/2016   IR CM INJ ANY COLONIC TUBE W/FLUORO 05/02/2016 Arne Cleveland, MD [facility]   IR GENERIC HISTORICAL  05/15/2016   IR GJ TUBE CHANGE 05/15/2016 Sandi Mariscal, MD [facility]   IR GENERIC HISTORICAL  06/28/2016   IR GJ TUBE CHANGE 06/28/2016 [facility]   IR GJ TUBE CHANGE  02/20/2017   IR GJ TUBE CHANGE  05/10/2017   IR GJ TUBE CHANGE  07/06/2017   IR GJ TUBE CHANGE  08/02/2017   IR GJ TUBE CHANGE  10/15/2017   IR GJ TUBE CHANGE  12/26/2017   IR GJ TUBE CHANGE  04/08/2018   IR GJ TUBE CHANGE  06/19/2018   IR GJ TUBE CHANGE  03/03/2019   IR GJ TUBE CHANGE  06/06/2019   IR GJ TUBE CHANGE  09/26/2019   IR GJ TUBE CHANGE  01/09/2020   IR GJ TUBE CHANGE  05/12/2020   IR GJ TUBE CHANGE  10/05/2020   IR GJ TUBE CHANGE  10/14/2020   IR GJ TUBE CHANGE  10/19/2020   IR GJ TUBE CHANGE  02/25/2021   IR GJ TUBE CHANGE  03/23/2021   IR GJ TUBE CHANGE  08/29/2021   IR GJ TUBE CHANGE  08/30/2021   IR GJ TUBE CHANGE  09/05/2021   IR PATIENT EVAL TECH 0-60 MINS  10/19/2016   IR PATIENT EVAL TECH 0-60 MINS  12/25/2016   IR PATIENT EVAL TECH 0-60 MINS  01/29/2017   IR PATIENT EVAL TECH 0-60 MINS  04/04/2017   IR PATIENT EVAL TECH 0-60 MINS   04/30/2017   IR PATIENT EVAL TECH 0-60 MINS  07/31/2017   IR REPLC DUODEN/JEJUNO TUBE PERCUT W/FLUORO  11/14/2016   IR REPLC GASTRO/COLONIC TUBE PERCUT  W/FLUORO  05/31/2021   IRRIGATION AND DEBRIDEMENT FOOT Bilateral 12/26/2020   Procedure: IRRIGATION AND DEBRIDEMENT FOOT AND BONE BIOPSY;  Surgeon: Trula Slade, DPM;  Location: [facility];  Service: Podiatry;  Laterality: Bilateral;   LAPAROTOMY N/A 01/20/2015   Procedure: EXPLORATORY LAPAROTOMY;  Surgeon: Coralie Keens, MD;  Location: [facility];  Service: General;  Laterality: N/A;   LOWER EXTREMITY ANGIOGRAPHY Left 09/21/2016   Procedure: Lower Extremity Angiography;  Surgeon: Waynetta Sandy, MD;  Location: [facility];  Service: Cardiovascular;  Laterality: Left;   LOWER EXTREMITY ANGIOGRAPHY Right 06/27/2017   Procedure: Lower Extremity Angiography;  Surgeon: Waynetta Sandy, MD;  Location: [facility];  Service: Cardiovascular;  Laterality: Right;   LOWER EXTREMITY ANGIOGRAPHY Right 12/24/2020   Procedure: LOWER EXTREMITY ANGIOGRAPHY;  Surgeon: Cherre Robins, MD;  Location: [facility];  Service: Cardiovascular;  Laterality: Right;   LYSIS OF ADHESION N/A 01/20/2015   Procedure: LYSIS OF ADHESIONS < 1 HOUR;  Surgeon: Coralie Keens, MD;  Location: [facility];  Service: General;  Laterality: N/A;   PERIPHERAL VASCULAR BALLOON ANGIOPLASTY Left 09/21/2016   Procedure: Peripheral Vascular Balloon Angioplasty;  Surgeon: Waynetta Sandy, MD;  Location: [facility];  Service: Cardiovascular;  Laterality: Left;  drug coated balloon   PERIPHERAL VASCULAR BALLOON ANGIOPLASTY Right 06/27/2017   Procedure: PERIPHERAL VASCULAR BALLOON ANGIOPLASTY;  Surgeon: Waynetta Sandy, MD;  Location: [facility];  Service: Cardiovascular;  Laterality: Right;  SFA/[id number]   PERIPHERAL VASCULAR INTERVENTION Left 10/20/2020   Procedure: PERIPHERAL VASCULAR INTERVENTION;  Surgeon: Cherre Robins, MD;  Location: [facility];  Service: Cardiovascular;  Laterality: Left;   TONSILLECTOMY     TRANSMETATARSAL AMPUTATION Right 11/14/2019   Procedure: TRANSMETATARSAL AMPUTATION RIGHT FOOT;  Surgeon: Edrick Kins, DPM;  Location: [facility];  Service: Podiatry;  Laterality: Right;   TUBAL LIGATION     VENTRAL HERNIA REPAIR  2015   incarcerated ventral hernia ([facility] 09/2013)/notes 01/10/2015   WOUND EXPLORATION Right 10/20/2020   Procedure: PRIMARY CLOSURE COMMON FEMORAL ARTERY;  Surgeon: Cherre Robins, MD;  Location: [facility];  Service: Vascular;  Laterality: Right;   Social History:   reports that she has never smoked. She has never been exposed to tobacco smoke. She quit smokeless tobacco use about 41 years ago.  Her smokeless tobacco use included snuff. She reports that she does not drink alcohol and does not use drugs.  Family History  Problem Relation Age of Onset   Stroke Mother    Hypertension Mother    Diabetes Brother    Glaucoma Son    Glaucoma Son    Heart attack Neg Hx    Colon cancer Neg Hx    Stomach cancer Neg Hx     Medications: Patient's Medications  New Prescriptions   No medications on file  Previous Medications   ACETAMINOPHEN (TYLENOL) 500 MG TABLET    Take 500 mg by mouth every 6 (six) hours as needed for moderate pain.   AMLODIPINE (NORVASC) 5 MG TABLET    Take 5 mg by mouth daily.   ASPIRIN 81 MG EC TABLET    Take 1 tablet (81 mg total) by mouth daily. Swallow whole.   ATORVASTATIN (LIPITOR) 10 MG TABLET    Take 10 mg by mouth daily.   B COMPLEX VITAMINS TABLET    Take 1 tablet by mouth daily.    CHOLECALCIFEROL (VITAMIN D3) 25 MCG (1000 UNIT) TABLET  Take 1,000 Units by mouth daily.   CYCLOSPORINE (RESTASIS) 0.05 % OPHTHALMIC EMULSION    Place 1 drop into both eyes 2 (two) times daily.   DOXYCYCLINE (VIBRA-TABS) 100 MG TABLET    Take 100 mg by mouth 2 (two) times daily.   DULOXETINE (CYMBALTA) 30 MG CAPSULE    Take 30 mg by mouth daily.   FERROUS  SULFATE 220 (44 FE) MG/5ML SOLUTION    TAKE 7.4 MLS (325 MG TOTAL) BY MOUTH DAILY.   FLUTICASONE (FLONASE) 50 MCG/ACT NASAL SPRAY    SPRAY 2 SPRAYS INTO EACH NOSTRIL EVERY DAY   FUROSEMIDE (LASIX) 40 MG TABLET    TAKE 0.5-1 TABLETS (20-40 MG TOTAL) BY MOUTH SEE ADMIN INSTRUCTIONS. TAKE 20 MG DAILY, MAY INSTEAD TAKE 40 MG DAILY AS NEEDED FOR EDEMA   GABAPENTIN (NEURONTIN) 300 MG CAPSULE    Take 300 mg by mouth 2 (two) times daily.   GENTAMICIN CREAM (GARAMYCIN) 0.1 %    Apply 1 application topically daily as needed (dry skin).   HYDRALAZINE (APRESOLINE) 50 MG TABLET    Take 1 tablet (50 mg total) by mouth 3 (three) times daily.   LATANOPROST (XALATAN) 0.005 % OPHTHALMIC SOLUTION    Place 1 drop into both eyes at bedtime.   LEVOTHYROXINE (SYNTHROID) 125 MCG TABLET    Take 125 mcg by mouth daily before breakfast.   LORATADINE (CLARITIN) 10 MG TABLET    Take 10 mg by mouth daily.   MOMETASONE-FORMOTEROL (DULERA) 100-5 MCG/ACT AERO    Inhale 2 puffs into the lungs 2 (two) times daily.   MULTIPLE VITAMIN (MULTIVITAMIN WITH MINERALS) TABS TABLET    Take 1 tablet by mouth daily.   NUTRITIONAL SUPPLEMENTS (FEEDING SUPPLEMENT, OSMOLITE 1.5 CAL,) LIQD    Place 780 mLs into feeding tube See admin instructions. Take 780 ml over a 12 hour period per day 9pm-9am at a rate of 55   NYSTATIN CREAM (MYCOSTATIN)    Apply 1 application  topically 2 (two) times daily.   PANTOPRAZOLE (PROTONIX) 40 MG TABLET    Take 1 tablet (40 mg total) by mouth 2 (two) times daily.   POLYETHYL GLYCOL-PROPYL GLYCOL (SYSTANE) 0.4-0.3 % GEL OPHTHALMIC GEL    Place 1 application into both eyes 2 (two) times daily as needed (moisture).   POLYETHYLENE GLYCOL POWDER (GLYCOLAX/MIRALAX) 17 GM/SCOOP POWDER    Place 17 g into feeding tube at bedtime.   PROBIOTIC PRODUCT (EQL PROBIOTIC COLON SUPPORT PO)    Take 1 capsule by mouth daily.   SENNA-DOCUSATE (SENOKOT-S) 8.6-50 MG TABLET    Take 1 tablet by mouth at bedtime as needed for mild  constipation.   SODIUM CHLORIDE-SODIUM BICARB (NETI POT SINUS WASH NA)    Place 1 application into the nose at bedtime.   TRAMADOL (ULTRAM) 50 MG TABLET    Place 1 tablet (50 mg total) into feeding tube every 6 (six) hours as needed for severe pain.   WATER FOR IRRIGATION, STERILE (FREE WATER) SOLN    Place 180 mLs into feeding tube 3 (three) times daily.   WHITE PETROLATUM-MINERAL OIL (SYSTANE NIGHTTIME) OINT    Place 1 application into both eyes at bedtime.   Modified Medications   No medications on file  Discontinued Medications   AMLODIPINE (NORVASC) 5 MG TABLET    TAKE 1 TABLET BY MOUTH EVERY DAY   ATORVASTATIN (LIPITOR) 10 MG TABLET    TAKE 1 TABLET BY MOUTH EVERY DAY   CYCLOSPORINE (RESTASIS) 0.05 % OPHTHALMIC EMULSION  USE 1 DROP INTO BOTH EYES TWICE DAILY   DOXYCYCLINE (VIBRA-TABS) 100 MG TABLET    TAKE 1 TABLET BY MOUTH TWICE A DAY   DULOXETINE (CYMBALTA) 30 MG CAPSULE    TAKE 1 CAPSULE BY MOUTH EVERY DAY   GABAPENTIN (NEURONTIN) 300 MG CAPSULE    TAKE 1 CAPSULE BY MOUTH TWICE A DAY   INSULIN PEN NEEDLE (B-D UF III MINI PEN NEEDLES) 31G X 5 MM MISC    Use twice daily with giving insulin injections. Dx E11.9   LEVOTHYROXINE (SYNTHROID) 125 MCG TABLET    TAKE 1 TABLET BY MOUTH EVERY DAY   LIVER OIL-ZINC OXIDE (DESITIN) 40 % OINTMENT    Apply topically 2 (two) times daily.   MOMETASONE-FORMOTEROL (DULERA) 200-5 MCG/ACT AERO    INHALE TWO PUFFS BY MOUTH TWICE DAILY   NYSTATIN CREAM (MYCOSTATIN)    APPLY TO AFFECTED AREA TWICE A DAY    Physical Exam:  Vitals:   09/12/21 1303  BP: 100/70  Pulse: 77  Resp: 16  Temp: 97.8 F (36.6 C)  SpO2: 98%  Weight: 124 lb 3.2 oz (56.3 kg)  Height: '5\' 6"'  (1.676 m)   Body mass index is 20.05 kg/m. Wt Readings from Last 3 Encounters:  09/12/21 124 lb 3.2 oz (56.3 kg)  08/30/21 130 lb 8.2 oz (59.2 kg)  06/21/21 123 lb (55.8 kg)    Physical Exam Constitutional:      General: She is not in acute distress.    Appearance: She is  well-developed. She is not diaphoretic.  HENT:     Head: Normocephalic and atraumatic.     Mouth/Throat:     Pharynx: No oropharyngeal exudate.  Eyes:     Conjunctiva/sclera: Conjunctivae normal.     Pupils: Pupils are equal, round, and reactive to light.  Cardiovascular:     Rate and Rhythm: Normal rate and regular rhythm.     Heart sounds: Normal heart sounds.  Pulmonary:     Effort: Pulmonary effort is normal.     Breath sounds: Normal breath sounds.  Abdominal:     General: Bowel sounds are normal.     Palpations: Abdomen is soft.     Comments: G tube site with mild erythema, drainage noted around site from leaking.   Musculoskeletal:     Cervical back: Normal range of motion and neck supple.     Right lower leg: No edema.     Left lower leg: No edema.  Feet:     Comments: Bilateral feet wrapped and dressed and in boot Skin:    General: Skin is warm and dry.  Neurological:     Mental Status: She is alert and oriented to person, place, and time. Mental status is at baseline.     Gait: Gait abnormal (slow, walks with walker).  Psychiatric:        Mood and Affect: Mood normal.     Labs reviewed: Basic Metabolic Panel: Recent Labs    11/04/20 1114 12/20/20 1443 12/27/20 0103 12/28/20 0417 12/28/20 1539 12/29/20 0203 12/30/20 0233 03/07/21 1120 05/04/21 1502 08/28/21 2150 08/29/21 0717 08/30/21 0031  NA 135   < > 129* 130*   < > 131*   < > 139   < > 141 138 137  K 4.9   < > 4.3 4.4   < > 4.3   < > 4.4   < > 4.1 3.7 3.4*  CL 101   < > 100 103   < > 103   < >  101   < > 110 108 107  CO2 28   < > 21* 22   < > 23   < > 29   < > '23 25 22  ' GLUCOSE 74   < > 191* 142*   < > 107*   < > 46*   < > 109* 104* 175*  BUN 61*   < > 39* 49*   < > 42*   < > 68*   < > 64* 56* 45*  CREATININE 1.61*   < > 1.21* 1.15*   < > 1.11*   < > 1.72*   < > 2.04* 1.69* 1.55*  CALCIUM 9.5   < > 8.6* 8.9   < > 9.0   < > 9.4   < > 9.5 9.4 9.0  MG  --    < > 1.7 1.8  --  2.0  --   --   --   --    --   --   PHOS  --    < > 3.5 3.0  --  2.7  --   --   --   --   --   --   TSH 0.41  --   --   --   --   --   --  3.06  --   --   --   --    < > = values in this interval not displayed.   Liver Function Tests: Recent Labs    02/14/21 1447 03/07/21 1120 08/28/21 2150 08/30/21 0031  AST 22 24 42* 38  ALT '17 14 19 20  ' ALKPHOS 76  --  77 70  BILITOT 0.3 0.5 0.6 0.5  PROT 7.0 7.3 7.1 6.4*  ALBUMIN 3.0*  --  3.2* 2.8*   Recent Labs    08/28/21 2150  LIPASE 21   No results for input(s): "AMMONIA" in the last 8760 hours. CBC: Recent Labs    06/08/21 1443 08/28/21 2150 08/29/21 0717 08/30/21 0031  WBC 5.4 4.7 4.4 3.3*  NEUTROABS 3,499 2.6  --  2.0  HGB 9.3* 8.9* 8.9* 8.8*  HCT 28.9* 26.8* 27.1* 26.2*  MCV 96.3 96.8 97.1 95.6  PLT 235 208 198 188   Lipid Panel: Recent Labs    11/04/20 1114 03/07/21 1120  CHOL 111 106  HDL 55 51  LDLCALC 39 35  TRIG 91 113  CHOLHDL 2.0 2.1   TSH: Recent Labs    11/04/20 1114 03/07/21 1120  TSH 0.41 3.06   A1C: Lab Results  Component Value Date   HGBA1C 6.3 (H) 06/08/2021     Assessment/Plan 1. Type 2 diabetes mellitus with peripheral neuropathy (HCC) -Encouraged dietary compliance, routine foot care/monitoring and to keep up with diabetic eye exams through ophthalmology. Continues off all medications.   2. Essential hypertension, benign -Blood pressure well controlled Continue current medications Recheck metabolic panel - CBC with Differential/Platelet - CMP with eGFR([facility])  3. Acquired hypothyroidism -TSH in normal range, continue synthroid 125 mcg  4. Chronic diastolic CHF (congestive heart failure) (HCC) -euvolemic, continues on lasix   5. Anemia due to blood loss -continues on iron supplement - CBC with Differential/Platelet  6. Other osteomyelitis of right foot ([place]) -continues on doxycyline 100 mg by mouth daily, continues to follow up with podiatrist.   7. Chronic kidney disease, stage 4, severely  decreased GFR (HCC) -Chronic and stable Encourage proper hydration Follow metabolic panel Avoid nephrotoxic meds (NSAIDS)  8. PEG tube malfunction (  [place]) -ongoing leakage, continues with daily care to protect skin  9. Chronic low back pain, unspecified back pain laterality, unspecified whether sciatica present The current medical regimen is effective;  continue present plan and medications.  10. Nasal congestion -to avoid forcefully blowing nose -start xyzal- stop current antihistamine.  -increase nasal wash to twice daily   Return in about 3 months (around 12/13/2021). Shannon [name]. [place], [facility] [phone]

## 2021-09-13 ENCOUNTER — Telehealth: Payer: Self-pay | Admitting: Nurse Practitioner

## 2021-09-13 ENCOUNTER — Other Ambulatory Visit: Payer: Self-pay | Admitting: *Deleted

## 2021-09-13 LAB — COMPLETE METABOLIC PANEL WITH GFR
AG Ratio: 0.9 (calc) — ABNORMAL LOW (ref 1.0–2.5)
ALT: 16 U/L (ref 6–29)
AST: 21 U/L (ref 10–35)
Albumin: 3.5 g/dL — ABNORMAL LOW (ref 3.6–5.1)
Alkaline phosphatase (APISO): 84 U/L (ref 37–153)
BUN/Creatinine Ratio: 44 (calc) — ABNORMAL HIGH (ref 6–22)
BUN: 63 mg/dL — ABNORMAL HIGH (ref 7–25)
CO2: 29 mmol/L (ref 20–32)
Calcium: 9.7 mg/dL (ref 8.6–10.4)
Chloride: 99 mmol/L (ref 98–110)
Creat: 1.43 mg/dL — ABNORMAL HIGH (ref 0.60–0.95)
Globulin: 3.7 g/dL (calc) (ref 1.9–3.7)
Glucose, Bld: 109 mg/dL (ref 65–139)
Potassium: 5 mmol/L (ref 3.5–5.3)
Sodium: 136 mmol/L (ref 135–146)
Total Bilirubin: 0.3 mg/dL (ref 0.2–1.2)
Total Protein: 7.2 g/dL (ref 6.1–8.1)
eGFR: 34 mL/min/{1.73_m2} — ABNORMAL LOW (ref >=60)

## 2021-09-13 LAB — CBC WITH DIFFERENTIAL/PLATELET
Absolute Monocytes: 775 cells/uL (ref 200–950)
Basophils Absolute: 50 cells/uL (ref 0–200)
Basophils Relative: 1 %
Eosinophils Absolute: 190 cells/uL (ref 15–500)
Eosinophils Relative: 3.8 %
HCT: 28.9 % — ABNORMAL LOW (ref 35.0–45.0)
Hemoglobin: 9.3 g/dL — ABNORMAL LOW (ref 11.7–15.5)
Lymphs Abs: 655 cells/uL — ABNORMAL LOW (ref 850–3900)
MCH: 31.2 pg (ref 27.0–33.0)
MCHC: 32.2 g/dL (ref 32.0–36.0)
MCV: 97 fL (ref 80.0–100.0)
MPV: 12.1 fL (ref 7.5–12.5)
Monocytes Relative: 15.5 %
Neutro Abs: 3330 cells/uL (ref 1500–7800)
Neutrophils Relative %: 66.6 %
Platelets: 238 10*3/uL (ref 140–400)
RBC: 2.98 10*6/uL — ABNORMAL LOW (ref 3.80–5.10)
RDW: 14.3 % (ref 11.0–15.0)
Total Lymphocyte: 13.1 %
WBC: 5 10*3/uL (ref 3.8–10.8)

## 2021-09-13 NOTE — Patient Outreach (Signed)
[facility]) Care Management Telephonic RN Care Manager Note   09/13/2021 Name:  Shannon [name] MRN:  [id number] DOB:  October 20, 1923  Summary: Incoming call received back from member.   Denies any urgent concerns, encouraged to contact this care manager with questions.     Subjective: Shannon [name] is an 88 y.o. year old female who is a primary patient of Sharon Seller, NP. The care management team was consulted for assistance with care management and/or care coordination needs.    Telephonic RN Care Manager completed Telephone Visit today.  Objective:   Medications Reviewed Today     Reviewed by Dicky Doe, CMA (Certified Medical Assistant) on 09/12/21 at 1320  Med List Status: <None>   Medication Order Taking? Sig Documenting Provider Last Dose Status Informant  acetaminophen (TYLENOL) 500 MG tablet [id number] Yes Take 500 mg by mouth every 6 (six) hours as needed for moderate pain. [provider] Taking Active Child  amLODipine (NORVASC) 5 MG tablet [id number] Yes Take 5 mg by mouth daily. [provider] Taking Active   aspirin 81 MG EC tablet [id number] Yes Take 1 tablet (81 mg total) by mouth daily. Swallow whole. Leonie Douglas, MD Taking Active Child           Med Note Porfirio Mylar   Tue Feb 15, 2021 10:20 AM)    atorvastatin (LIPITOR) 10 MG tablet [id number] Yes Take 10 mg by mouth daily. [provider] Taking Active   b complex vitamins tablet [id number] Yes Take 1 tablet by mouth daily.  [provider] Taking Active Child  cholecalciferol (VITAMIN D3) 25 MCG (1000 UNIT) tablet [id number] Yes Take 1,000 Units by mouth daily. [provider] Taking Active Child  cycloSPORINE (RESTASIS) 0.05 % ophthalmic emulsion [id number] Yes Place 1 drop into both eyes 2 (two) times daily. [provider] Taking Active   doxycycline (VIBRA-TABS) 100 MG tablet [id number] Yes Take 100 mg by mouth 2 (two) times  daily. [provider] Taking Active   DULoxetine (CYMBALTA) 30 MG capsule [id number] Yes Take 30 mg by mouth daily. [provider] Taking Active   ferrous sulfate 220 (44 Fe) MG/5ML solution [id number] Yes TAKE 7.4 MLS (325 MG TOTAL) BY MOUTH DAILY. Sharon Seller, NP Taking Active   fluticasone [facility]) 50 MCG/ACT nasal spray [id number] Yes SPRAY 2 SPRAYS INTO EACH NOSTRIL EVERY DAY Sharon Seller, NP Taking Active   furosemide (LASIX) 40 MG tablet [id number] Yes TAKE 0.5-1 TABLETS (20-40 MG TOTAL) BY MOUTH SEE ADMIN INSTRUCTIONS. TAKE 20 MG DAILY, MAY INSTEAD TAKE 40 MG DAILY AS NEEDED FOR EDEMA Meriam Sprague, MD Taking Active Child           Med Note Worthy Rancher, CHASIE F   Tue Feb 15, 2021 10:22 AM)    gabapentin (NEURONTIN) 300 MG capsule [id number] Yes Take 300 mg by mouth 2 (two) times daily. [provider] Taking Active   gentamicin cream (GARAMYCIN) 0.1 % [id number] Yes Apply 1 application topically daily as needed (dry skin). Felecia Shelling, DPM Taking Active Child  hydrALAZINE (APRESOLINE) 50 MG tablet [id number] Yes Take 1 tablet (50 mg total) by mouth 3 (three) times daily. Meriam Sprague, MD Taking Active Child  latanoprost (XALATAN) 0.005 % ophthalmic solution [id number] Yes Place 1 drop into both eyes at bedtime. [provider] Taking Active Child           Med Note (SUTPHIN, CHASIE F   Tue  Feb 15, 2021 10:23 AM)    levothyroxine (SYNTHROID) 125 MCG tablet [id number] Yes Take 125 mcg by mouth daily before breakfast. [provider] Taking Active   loratadine (CLARITIN) 10 MG tablet [id number] Yes Take 10 mg by mouth daily. [provider] Taking Active Child  mometasone-formoterol (DULERA) 100-5 MCG/ACT AERO [id number] Yes Inhale 2 puffs into the lungs 2 (two) times daily. [provider] Taking Active   Multiple Vitamin (MULTIVITAMIN WITH MINERALS) TABS tablet [id number] Yes Take 1 tablet by mouth daily.  [provider] Taking Active Child  Nutritional Supplements (FEEDING SUPPLEMENT, OSMOLITE 1.5 CAL,) LIQD [id number] Yes Place 780 mLs into feeding tube See admin instructions. Take 780 ml over a 12 hour period per day 9pm-9am at a rate of 55 [provider] Taking Active Child  nystatin cream (MYCOSTATIN) [id number] Yes Apply 1 application  topically 2 (two) times daily. [provider] Taking Active   pantoprazole (PROTONIX) 40 MG tablet [id number] Yes Take 1 tablet (40 mg total) by mouth 2 (two) times daily. Sharon Seller, NP Taking Active   Polyethyl Glycol-Propyl Glycol (SYSTANE) 0.4-0.3 % GEL ophthalmic gel [id number] Yes Place 1 application into both eyes 2 (two) times daily as needed (moisture). [provider] Taking Active Child  polyethylene glycol powder (GLYCOLAX/MIRALAX) 17 GM/SCOOP powder [id number] Yes Place 17 g into feeding tube at bedtime. [provider] Taking Active Child  Probiotic Product (EQL PROBIOTIC COLON SUPPORT PO) [id number] Yes Take 1 capsule by mouth daily. [provider] Taking Active Child  senna-docusate (SENOKOT-S) 8.6-50 MG tablet [id number] Yes Take 1 tablet by mouth at bedtime as needed for mild constipation. [provider] Taking Active Child  Sodium Chloride-Sodium Bicarb (NETI POT SINUS WASH NA) [id number] Yes Place 1 application into the nose at bedtime. [provider] Taking Active Child           Med Note [facility] MENDEZ, CARLOS A   Wed May 04, 2021  2:26 PM)    traMADol (ULTRAM) 50 MG tablet [id number] Yes Place 1 tablet (50 mg total) into feeding tube every 6 (six) hours as needed for severe pain. Lars Mage, PA-C Taking Active Child  Water For Irrigation, Sterile (FREE WATER) Criss Rosales [id number] Yes Place 180 mLs into feeding tube 3 (three) times daily. [provider] Taking Active Child  White Petrolatum-Mineral Oil (SYSTANE NIGHTTIME) OINT [id number] Yes Place 1 application  into both eyes at bedtime.  [provider] Taking Active Child             SDOH:  (Social Determinants of Health) assessments and interventions performed:     Care Plan  Review of patient past medical history, allergies, medications, health status, including review of consultants reports, laboratory and other test data, was performed as part of comprehensive evaluation for care management services.   Care Plan : General Plan of Care (Adult)  Updates made by Kemper Durie, RN since 09/13/2021 12:00 AM     Problem: Difficulty managing chronic medical conditions (CHF, DM, HTN, PVD)   Priority: High     Long-Range Goal: Member and family will report adequate management of chronic health conditions (CHF, DM, HTN, PVD)   Start Date: 02/21/2021  Expected End Date: 02/21/2022  This Visit's Progress: On track  Recent Progress: On track  Priority: High  Note:   Current Barriers:  Chronic Disease Management support and education needs related to CHF, HTN, DMII, and PVD  RNCM Clinical Goal(s):  Patient will verbalize understanding of  plan for management of CHF, HTN, DMII, and PVD as evidenced by member/family verbalizing stability in member's status take all medications exactly as prescribed and will call provider for medication related questions as evidenced by family reporting adherence    attend all scheduled medical appointments: PCP and Podiatry as evidenced by record of attendance        continue to work with RN Care Manager and/or Social Worker to address care management and care coordination needs related to CHF, HTN, DMII, and PVD as evidenced by adherence to CM Team Scheduled appointments     not experience hospital admission as evidenced by review of EMR. Hospital Admissions in last 6 months = 2  through collaboration with RN Care manager, provider, and care team.   Interventions: Inter-disciplinary care team collaboration (see longitudinal plan of care) Evaluation  of current treatment plan related to  self management and patient's adherence to plan as established by provider   Heart Failure Interventions:  (Status: Condition stable. Not addressed this visit.)  Long Term Goal  Basic overview and discussion of pathophysiology of Heart Failure reviewed Provided education on low sodium diet Advised patient to weigh each morning after emptying bladder Discussed importance of daily weight and advised patient to weigh and record daily  Diabetes:  (Status: Condition stable. Not addressed this visit.) Long Term Goal   Lab Results  Component Value Date   HGBA1C 6.3 (H) 06/08/2021  Assessed patient's understanding of A1c goal: <7% Discussed plans with patient for ongoing care management follow up and provided patient with direct contact information for care management team;      Provided patient with written educational materials related to hypo and hyperglycemia and importance of correct treatment;       Advised patient, providing education and rationale, to check cbg three times daily and record         PVD  (Status: Goal on track: NO.) Long Term Goal  Evaluation of current treatment plan related to  PVD ,  self-management and patient's adherence to plan as established by provider. Discussed plans with patient for ongoing care management follow up and provided patient with direct contact information for care management team Provided education to patient re: wound care; Reviewed scheduled/upcoming provider appointments including podiatry; Discussed plans with patient for ongoing care management follow up and provided patient with direct contact information for care management team;  Hypertension: (Status: Condition stable. Not addressed this visit.) Last practice recorded BP readings:  BP Readings from Last 3 Encounters:  06/21/21 (!) 170/80  06/08/21 (!) 150/80  05/04/21 140/60  Most recent eGFR/CrCl:  Lab Results  Component Value Date   EGFR 32 (L)  05/04/2021    No components found for: CRCL  Provided education to patient re: stroke prevention, s/s of heart attack and stroke; Reviewed prescribed diet low salt Discussed plans with patient for ongoing care management follow up and provided patient with direct contact information for care management team; Advised patient, providing education and rationale, to monitor blood pressure daily and record, calling PCP for findings outside established parameters;   Patient Goals/Self-Care Activities: Take medications as prescribed   Attend all scheduled provider appointments Call provider office for new concerns or questions  watch for swelling in feet, ankles and legs every day weigh myself daily track symptoms and what helps feel better or worse check blood sugar at prescribed times: three times daily check feet daily for cuts, sores or redness enter blood sugar readings and medication or insulin into  daily log take the blood sugar meter to all doctor visits   Update 11/22 - Along with above listed conditions, member also has had admissions for anemia from GI bleed, recently discharged on 11/16.  Medications changed (Plavix stopped and Carafate started).  She will follow up with PCP on 12/7. Also continues to have wounds on feet post transmetatarsal amputations, follow up with podiatry on 12/5.  She is working to regain her strength, does not feel PT is needed.   Per daughter, she feel she could use more help now as member's condition has steadily declined over the last several months.  Will send list of agencies for in home aide assistance, daughter aware there is an out of pocket fee as member does not have Medicaid.   Update 12/19 - Member was seen by podiatrist on 12/5, continues to have non-healing foot wound with drainage, ongoing dressing changes.  She now has home health nursing restarted for wound care of foot as well as new sacral wound.  Educated on ways to alleviate pressure and  decrease pain.  She was placed on antibiotics, wondering if she will need this reordered for longer term use.  She will have home health nurse examine it tomorrow and contact provider for orders if needed.  Will follow up with podiatrist on 1/11.  Was also seen by PCP on 12/7, follow up on 3/10.  Cardiology appointment scheduled for 2/1.  She report ongoing weight loss, now down to 120 pounds and decreased appetite.  She is working with her family on full liquids that she will be able to tolerate along with tube feeding.   Update 04/21/2021 - Member report feeling weak over the last week, HH nurse called PCP and podiatry office as she was showing signs of recurrent infection.  Foot wound still not healing, draining and tunneling per notes in chart.  Member was seen this week by podiatry, antibiotic was given to take indefinitely, member has decided not to proceed with further surgeries.  She will continue to have wound care in the home, does not feel PT would be beneficial as she state she is already starting to feel better  She will contact this care manager should she change her mind. She will keep appointments with PCP, cardiology, and podiatry in the future.     Update 2/16 - Spoke with member, state she is "ok."  Report she does not feel as strong as she had been in the past.  Unable to pinpoint anything specific but feels she has gotten weaker over the last several months.  She has been seen by cardiology and podiatry since last outreach.  Per cardiology note, she complained of chest fullness which may be related to constipation.  She has regime for constipation that she will continue, no other changes to plan of care, recommended follow up in 6 months.  She was unable to get her usual Osmolite and has been using an alternative, causing her to be on the feeding pump for a longer period of time to get the same nutrients.  Per podiatry note and member's report, she continues to have non-healing wounds on  feet, does not want any further surgery on her feet.  She  will continue to see podiatry monthly for noninvasive treatment in the office.  She is still taking antibiotics and home health for wound care remain active, next visit is today.  She does continue to monitor weights, blood sugar, and blood pressure daily, no readings to day  as she was still in bed during the call.  Report blood pressure has ranged 120-140s/70s, denies weight gain.     Update 3/16 - Per member, she was seen by podiatry yesterday.  She continues to have infection in both feet with drainage, home health remains involved with visits twice a week and long term antibiotics.  Member's daughter does wound care between visits.  Per note and confirmed by member, there was discussion regarding further treatment/surgery, she declines at this time.  Has follow up testing and office visit with vascular provider scheduled for 3/21, follow up with podiatry on 4/26.  If antibiotic does not work and infection continues to spread, she will be open to consider alternative interventions at that time.     Update 4/13 - Spoke with member and daughter.  Member report she remains active with home health for wound care of foot wounds, will have nurse visit today.  She complains of some leg pain, denies swelling.  Unable to verbalize if it is warm to touch or red.  Advised to have home health nurse examine it today and contact provider if there is signs of infection.  She denies having fever or confusion.  Has office visit with podiatry on 4/26.  She also voices some concern for indigestion, denies any nausea/vomiting.  She is taking Protonix twice a day as well as Tums with minimal relief.  She does not visit PCP until June but will call office to inquire about alternatives if she does not have any further relief with tums.     Update 5/11 - Member was seen by podiatrist on 4/26, per notes wounds on feet are stable, however did require some debridement in  the office.  She still has home health nursing twice a week, again declines to have further surgery on feet.  Her next visit will be on 6/7 and she will see PCP on 6/12.  Denies ongoing indigestion, does have any current issues with feeding tube.  Blood pressure has remained stable, she has not been able to securely stand for weights but denies any changes in weight.   Update 6/13 - Member was admitted to hospital 5/28-5/30, issues with GJ tube.  It was dislodged, had to be replaced.  State she still have some soreness at the site due to the multiple attempts to replace, remains with ongoing leaking at site, which has been a problem.  Continues to have issues with her PVD and foot wounds, home health nursing has stopped due to time of service being complete.  Daughter is performing all wound care at this time and collaborating with foot doctor for changes.  Remains on Doxycycline.  Next appointment scheduled for 7/17.  Was seen by PCP yesterday, having some sinus problems, stopped zyrtec and started on xyzal, will follow up in 3 months.        Plan:  Telephone follow up appointment with care management team member scheduled for:  1 month The patient has been provided with contact information for the care management team and has been advised to call with any health related questions or concerns.   Kemper Durie, RN, MSN, CCM [facility] Care Management  [facility] Manager [phone]

## 2021-09-13 NOTE — Patient Outreach (Signed)
[facility]) Care Management  09/13/2021  Shannon [name] 01/05/1924 [id number]   Outgoing call placed to member, unsuccessful.  HIPAA compliant voice message left, will follow up within the next 2-3 business days.  [name], RN, MSN, [place] Manager [phone]

## 2021-09-14 NOTE — Telephone Encounter (Signed)
Forwarded call to clinical lead

## 2021-09-15 ENCOUNTER — Ambulatory Visit: Payer: Self-pay | Admitting: *Deleted

## 2021-09-16 ENCOUNTER — Other Ambulatory Visit: Payer: Self-pay | Admitting: Podiatry

## 2021-09-19 NOTE — Progress Notes (Signed)
Subjective:  Patient presenting today for follow-up evaluation of chronic wounds to bilateral feet.  Mostly unchanged since last visit.  Wounds are stable.  They continue to change dressings 2 times per week.  No new complaints at this time  Past Medical History:  Diagnosis Date   Acute respiratory failure ([place])    Anemia    previous blood transfusions   Arthritis    "all over"   Asthma    Atrial flutter (HCC)    Bradycardia    requiring previous d/c of BB and reduction of amiodarone   CAD (coronary artery disease)    nonobstructive per notes   Chronic diastolic CHF (congestive heart failure) ([place])    CKD (chronic kidney disease), stage III ([place])    Complication of blood transfusion    "got the wrong blood type at [facility] in ~ 2015; no adverse reaction that we are aware of"/daughter, [name] (01/27/2016)   COPD (chronic obstructive pulmonary disease) ([place])    Depression    "light case"   DVT (deep venous thrombosis) ([place]) 01/2016   a. LLE DVT 01/2016 - switched from Eliquis to Coumadin.   Dyspnea    with some activity   Gastric stenosis    a. s/p stomach tube   GERD (gastroesophageal reflux disease)    GI bleed    Headache    History of 2019 novel coronavirus disease (COVID-19)    History of blood transfusion    "several" (01/27/2016)   History of stomach ulcers    Hyperlipidemia    Hypertension    Hypothyroidism    On home oxygen therapy    "2L; 9PM til 9AM" (06/27/2017)   PAD (peripheral artery disease) (HCC)    a. prior gangrene, toe amputations, intervention.   PAF (paroxysmal atrial fibrillation) ([place])    Paraesophageal hernia    Perforated gastric ulcer ([place])    Pneumonia    "a few times" (06/27/2017)   Seasonal allergies    SIADH (syndrome of inappropriate ADH production) ([place])    [name] 01/10/2015   Small bowel obstruction ([place])    "I don't know how many" (01/11/2015)   Stroke ([place])    several- no residual   Type II diabetes mellitus ([place])    "related  to prednisone use  for > 20 yr; once predinose stopped; no more DM RX" (01/27/2016)   UTI (urinary tract infection) 02/08/2016   Ventral hernia with bowel obstruction          Objective: Physical Exam General: The patient is alert and oriented x3 in no acute distress.  Dermatology: There is overall improvement noted to the ulcers of the right plantar forefoot amputation stump.  Please see attached photo.  No malodor.  Moderate serosanguineous drainage noted.  Overall the wounds appear very stable for the moment.  Vascular: Established PAD.  Monitored closely by vein and vascular.  Balloon angioplasty with vein and vascular.  Lower extremity angiography 12/24/2020  VAS [place] ABI W/WO TBI 01/25/2021 Right: Resting right ankle-brachial index indicates mild right lower  extremity arterial disease.  Left: Resting left ankle-brachial index indicates noncompressible left  lower extremity arteries. The left toe-brachial index is abnormal.   Neurological: Epicritic and protective threshold diminished bilaterally.   Musculoskeletal Exam: Status post TMA right foot.  Hallux valgus left with history of overlying ulcer and degenerative changes possible exposed bone and joint capsule.  Second and third toe amputation left.  Assessment: 1. s/p transmetatarsal amputation right foot. DOS: 11/14/2019 2.  H/o 2nd toe amputation left 3.  Chronic ulcers bilateral feet 4.  Chronic osteomyelitis bilateral feet  Plan of Care:  1. Patient was evaluated.   2.  Continue home nurse dressing changes 2 times weekly.  They have been applying Betadine and Aquacel to the wounds with dry dressings 3.  We are going to continue to pursue conservative palliative care for the patient.  Patient does not want any additional surgery to the feet.  Both the patient and daughter understand current situation of the chronic osteomyelitis and associated risks 5.  Medically necessary excisional debridement of the wounds was  performed today using a tissue nipper.  Excisional debridement of all necrotic nonviable tissue down to healthier bleeding viable tissue was performed with postdebridement measurement same as pre- 6.  Silvadene cream provided to apply to the first MTP of the left foot. Continue.  Betadine ointment also provided to apply to the right foot wounds.  Continue.  7.  Return to clinic monthly for conservative routine management  Edrick Kins, DPM [facility]  Dr. Edrick Kins, DPM    [address], [place] [address]                Office [phone]  Fax [phone]

## 2021-09-19 NOTE — Telephone Encounter (Signed)
Please advise 

## 2021-09-20 ENCOUNTER — Telehealth: Payer: Self-pay | Admitting: *Deleted

## 2021-09-20 NOTE — Telephone Encounter (Signed)
Called patient to notify that medication is ready at pharmacy on file. Spoke with daughter([name]).

## 2021-10-03 ENCOUNTER — Other Ambulatory Visit: Payer: Self-pay | Admitting: Nurse Practitioner

## 2021-10-07 ENCOUNTER — Telehealth: Payer: Self-pay

## 2021-10-07 NOTE — Telephone Encounter (Signed)
We received forms from [facility] requesting authorization for Alginate dressings and non sterile gauze. Shannon [name]'s requesting to confirm with the patient's daughter these items are still being used.   Called [name] and confirmed the supplies are not needed. Forms shredded.

## 2021-10-07 NOTE — Telephone Encounter (Signed)
Thank you for clarification.

## 2021-10-08 ENCOUNTER — Other Ambulatory Visit: Payer: Self-pay | Admitting: Vascular Surgery

## 2021-10-15 ENCOUNTER — Other Ambulatory Visit: Payer: Self-pay | Admitting: Nurse Practitioner

## 2021-10-17 ENCOUNTER — Ambulatory Visit (INDEPENDENT_AMBULATORY_CARE_PROVIDER_SITE_OTHER): Payer: Medicare Other | Admitting: Podiatry

## 2021-10-17 DIAGNOSIS — E0859 Diabetes mellitus due to underlying condition with other circulatory complications: Secondary | ICD-10-CM

## 2021-10-17 DIAGNOSIS — L97512 Non-pressure chronic ulcer of other part of right foot with fat layer exposed: Secondary | ICD-10-CM | POA: Diagnosis not present

## 2021-10-17 DIAGNOSIS — M86671 Other chronic osteomyelitis, right ankle and foot: Secondary | ICD-10-CM | POA: Diagnosis not present

## 2021-10-17 MED ORDER — MOMETASONE FURO-FORMOTEROL FUM 100-5 MCG/ACT IN AERO: 2.0000 | INHALATION_SPRAY | Freq: Two times a day (BID) | RESPIRATORY_TRACT | 2 refills | Status: DC

## 2021-10-17 NOTE — Progress Notes (Signed)
Chief Complaint  Patient presents with   Foot Ulcer    Right ulcer check   Subjective:  Patient presenting today for follow-up evaluation of chronic wounds to bilateral feet.  Overall the patient is doing better.  She says that she has a decrease in pain.  She continues to dress the feet 2 times weekly with home health nursing.  No new complaints at this time  Past Medical History:  Diagnosis Date   Acute respiratory failure ([place])    Anemia    previous blood transfusions   Arthritis    "all over"   Asthma    Atrial flutter (HCC)    Bradycardia    requiring previous d/c of BB and reduction of amiodarone   CAD (coronary artery disease)    nonobstructive per notes   Chronic diastolic CHF (congestive heart failure) ([place])    CKD (chronic kidney disease), stage III ([place])    Complication of blood transfusion    "got the wrong blood type at [facility] in ~ 2015; no adverse reaction that we are aware of"/daughter, [name] (01/27/2016)   COPD (chronic obstructive pulmonary disease) ([place])    Depression    "light case"   DVT (deep venous thrombosis) ([place]) 01/2016   a. LLE DVT 01/2016 - switched from Eliquis to Coumadin.   Dyspnea    with some activity   Gastric stenosis    a. s/p stomach tube   GERD (gastroesophageal reflux disease)    GI bleed    Headache    History of 2019 novel coronavirus disease (COVID-19)    History of blood transfusion    "several" (01/27/2016)   History of stomach ulcers    Hyperlipidemia    Hypertension    Hypothyroidism    On home oxygen therapy    "2L; 9PM til 9AM" (06/27/2017)   PAD (peripheral artery disease) (HCC)    a. prior gangrene, toe amputations, intervention.   PAF (paroxysmal atrial fibrillation) ([place])    Paraesophageal hernia    Perforated gastric ulcer ([place])    Pneumonia    "a few times" (06/27/2017)   Seasonal allergies    SIADH (syndrome of inappropriate ADH production) ([place])    [name] 01/10/2015   Small bowel obstruction ([place])     "I don't know how many" (01/11/2015)   Stroke ([place])    several- no residual   Type II diabetes mellitus ([place])    "related to prednisone use  for > 20 yr; once predinose stopped; no more DM RX" (01/27/2016)   UTI (urinary tract infection) 02/08/2016   Ventral hernia with bowel obstruction     Objective: Physical Exam General: The patient is alert and oriented x3 in no acute distress.  Dermatology: Overall the wounds appear very stable with slight improvement.  2 separate wounds are noted to the right foot each measuring approximately 1.0 x 0.7 x 0.2 cm.  Granular wound base.  No purulence.  No malodor.  Vascular: Established PAD.  Monitored closely by vein and vascular.  Balloon angioplasty with vein and vascular.  Lower extremity angiography 12/24/2020  VAS [place] ABI W/WO TBI 01/25/2021 Right: Resting right ankle-brachial index indicates mild right lower  extremity arterial disease.  Left: Resting left ankle-brachial index indicates noncompressible left  lower extremity arteries. The left toe-brachial index is abnormal.   Neurological: Epicritic and protective threshold diminished bilaterally.   Musculoskeletal Exam: Status post TMA right foot.  Hallux valgus left with history of overlying ulcer and degenerative changes.  Second and third toe amputation left.  Assessment: 1. s/p transmetatarsal amputation right foot. DOS: 11/14/2019 2.  H/o 2nd toe amputation left 3.  Chronic ulcers bilateral feet 4.  Chronic osteomyelitis bilateral feet  Plan of Care:  1. Patient was evaluated.   2.  Continue home nurse dressing changes 2 times weekly.  They have been applying Betadine and Aquacel to the wounds with dry dressings 3.  Continue palliative care for now and routine debridement  4.  Continue wearing postsurgical shoes bilateral  5.  Medically necessary excisional debridement of the wounds was performed today using a tissue nipper.  Excisional debridement of all necrotic nonviable tissue  down to healthier bleeding viable tissue was performed with postdebridement measurement same as pre- 6.  Patient is currently putting Aquacel Ag over the wound.  Continue. 7.  Patient is on chronic doxycycline 50 mg 2 times daily.  She is requesting to decrease the amount.  Okay to decrease to doxycycline 50 mg 1 time daily  8.  Return to clinic 6 weeks for conservative routine management  Edrick Kins, DPM [facility]  Dr. Edrick Kins, DPM    [address], [place] [address]                Office [phone]  Fax [phone]

## 2021-10-18 ENCOUNTER — Ambulatory Visit: Payer: Self-pay | Admitting: *Deleted

## 2021-10-18 IMAGING — DX DG CHEST 1V PORT
1 series · 1 of 1 positions shown · non-contrast
Comparison: 11/24/2018

CLINICAL DATA: Weakness syncopal episode

EXAM:
PORTABLE CHEST 1 VIEW

[chest ap]
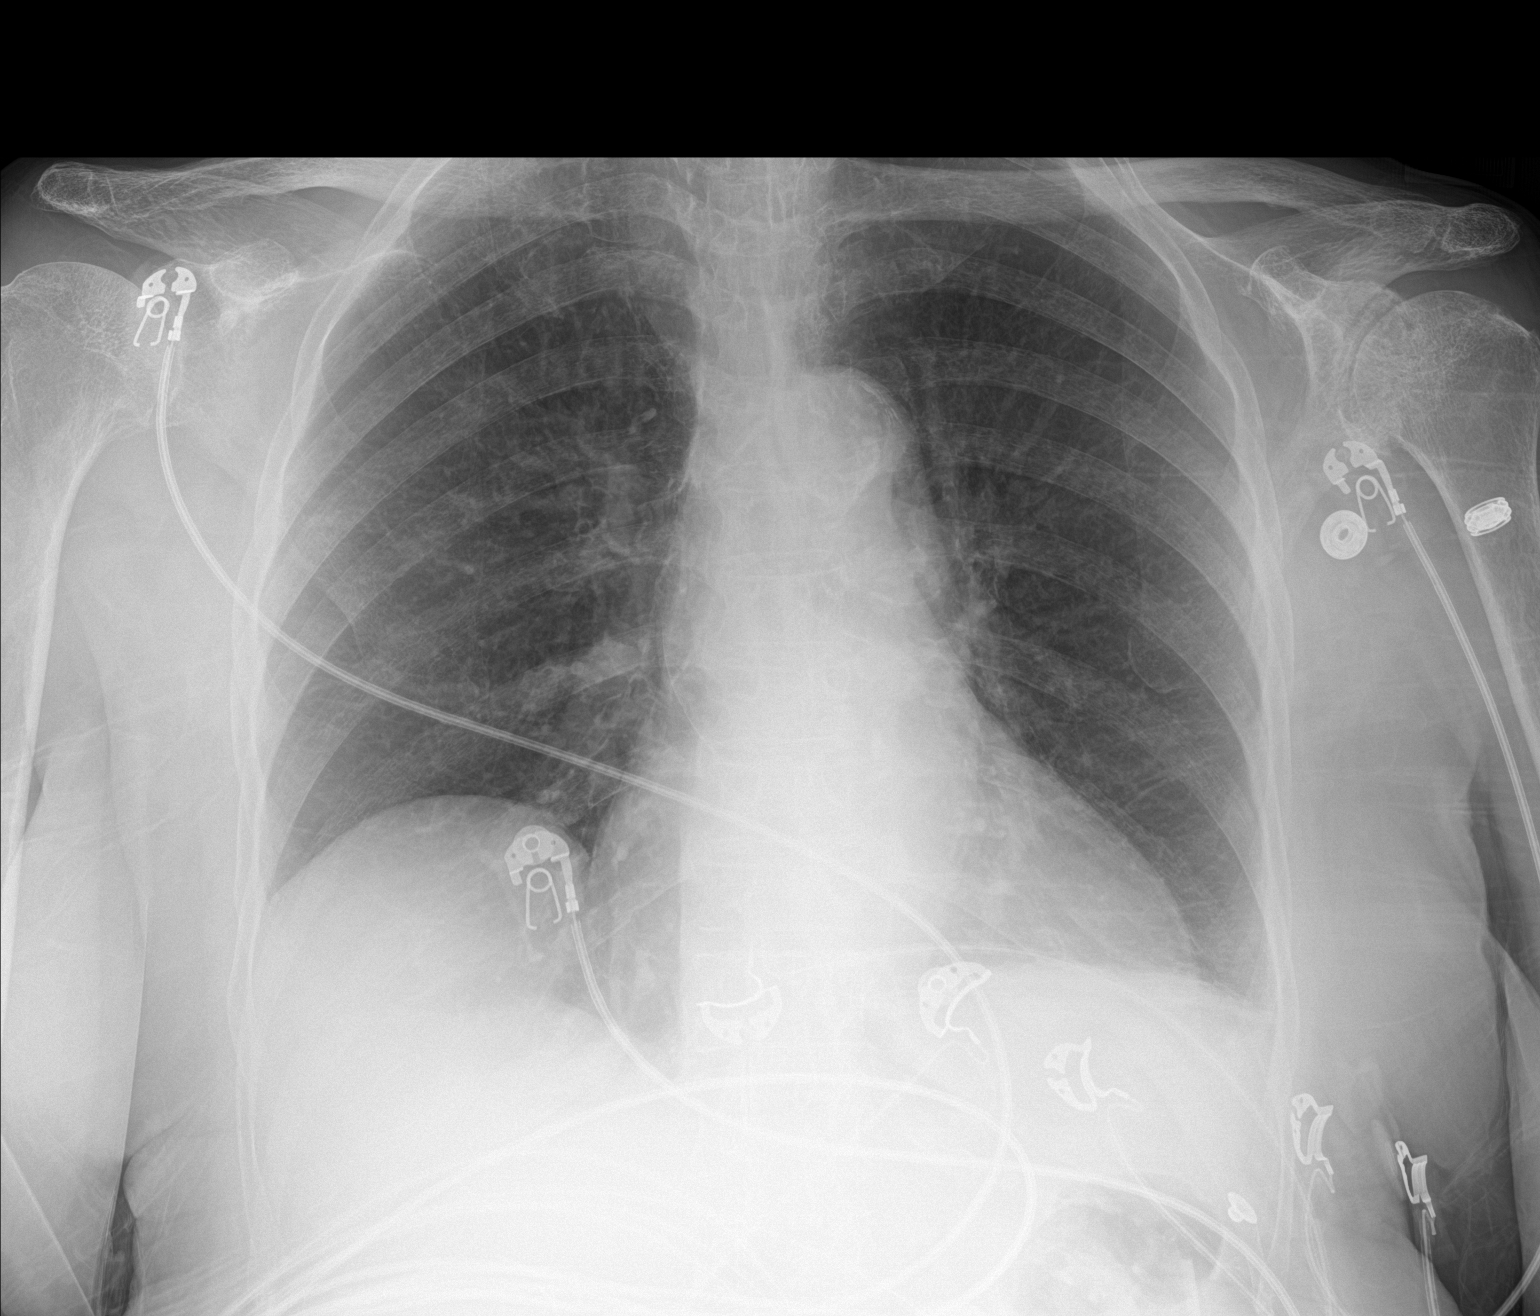

[1 of 1 positions shown; findings below may reference images not displayed]

FINDINGS: No focal airspace disease or pleural effusion. Mild cardiomegaly.
Atelectasis left base. Aortic atherosclerosis. No pneumothorax.
IMPRESSION: No active disease.  Mild cardiomegaly.

## 2021-10-18 IMAGING — DX DG ABDOMEN 1V
1 series · 1 of 1 positions shown · non-contrast
Comparison: None.

CLINICAL DATA: J-tube positioning.

EXAM:
ABDOMEN - 1 VIEW

[abdomen kub]
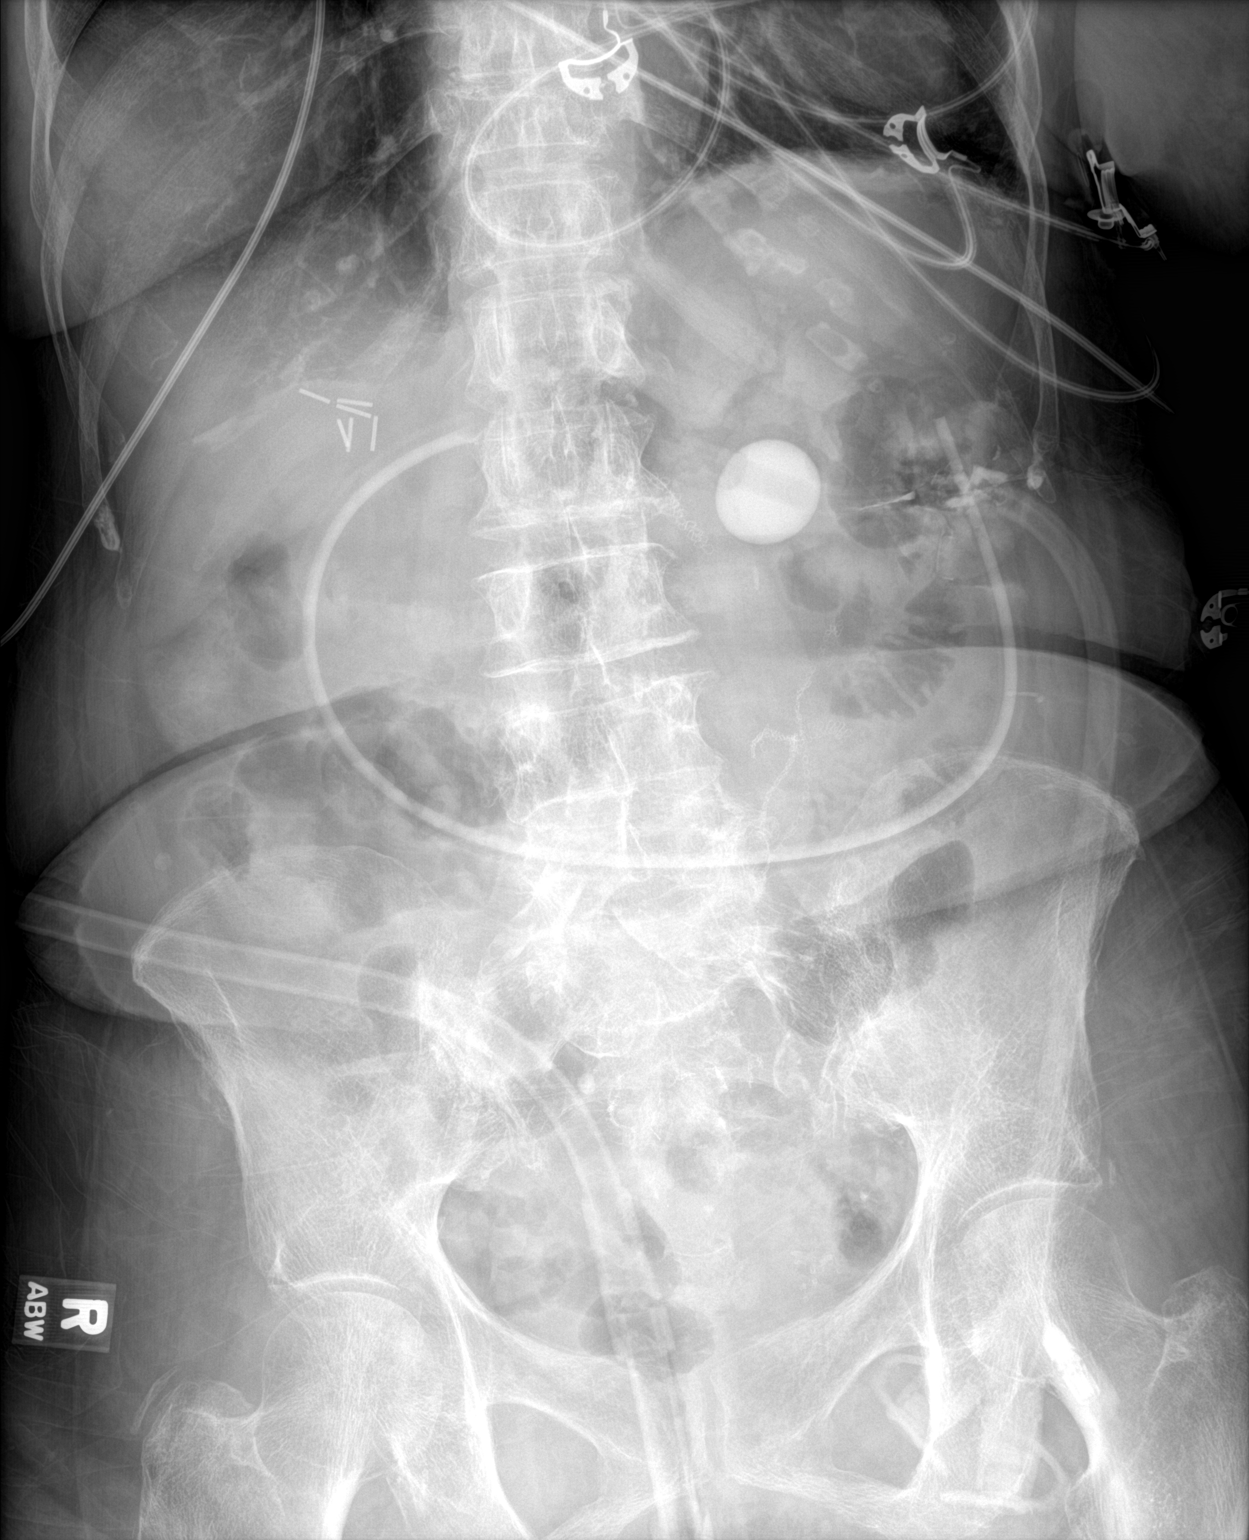

[1 of 1 positions shown; findings below may reference images not displayed]

FINDINGS: There is a percutaneous gastrojejunostomy tube in place. The
positioning of the tube appears grossly unremarkable. The retention
bulb is inflated. The configuration of the tube is similar to
placement on March 11, 2019. The bowel gas pattern is nonspecific
and nonobstructive.
IMPRESSION: 1. Unremarkable appearance of the gastrojejunostomy tube
2. Nonobstructive bowel gas pattern.

## 2021-10-18 IMAGING — CT CT HEAD W/O CM
3 of 4 series · 14 of 47 positions shown, 16 images · non-contrast
Comparison: 11/24/2018 head CT, 11/02/2016

CLINICAL DATA: Syncopal episode, weakness

EXAM:
CT HEAD WITHOUT CONTRAST
TECHNIQUE: Contiguous axial images were obtained from the base of the skull
through the vertex without intravenous contrast.

[Series 5: coronal soft tissue · coronal · 0.29mm/px · 3 of 63 slices shown]
[im 22/63  brain]
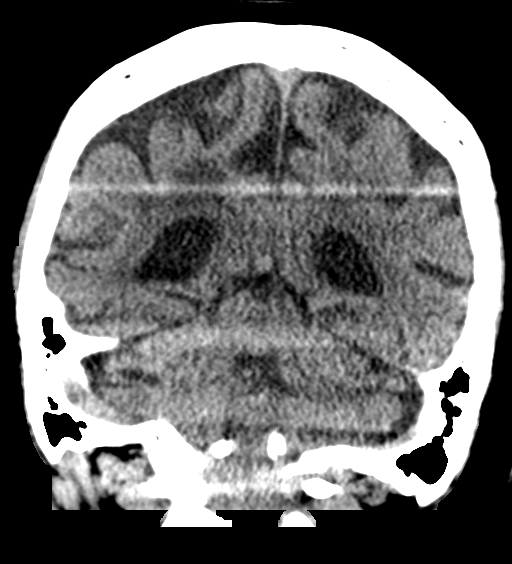
[im 28/63  brain]
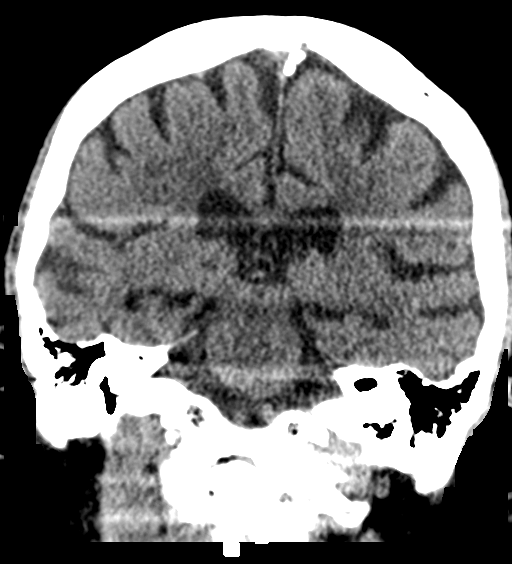
[im 35/63  brain]
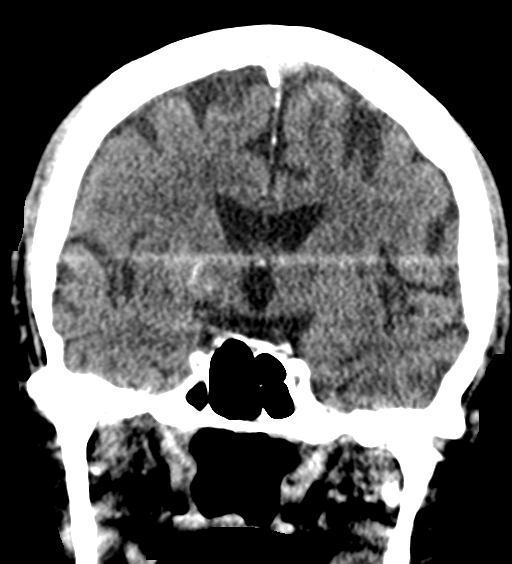

[Series 6: sagittal soft tissue · sagittal · 0.32mm/px · 3 of 50 slices shown]
[im 17/50  brain]
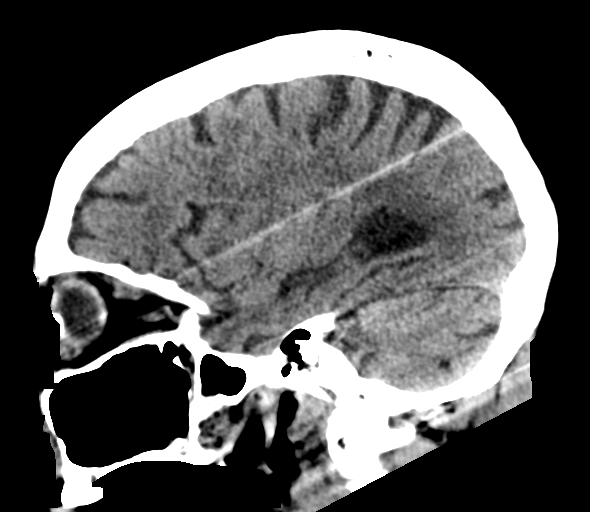
[im 25/50  brain]
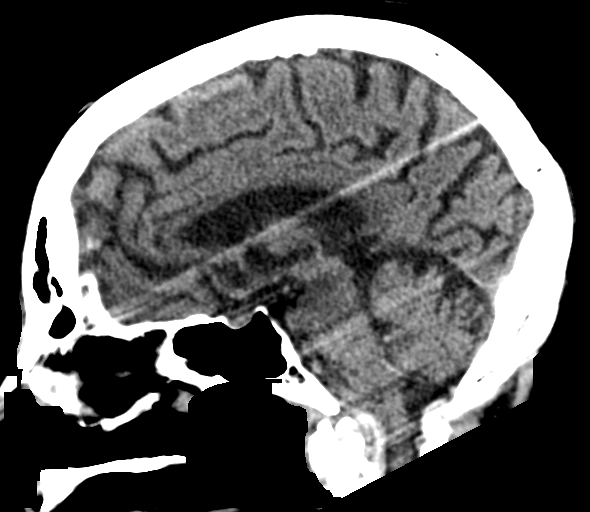
[im 33/50  brain]
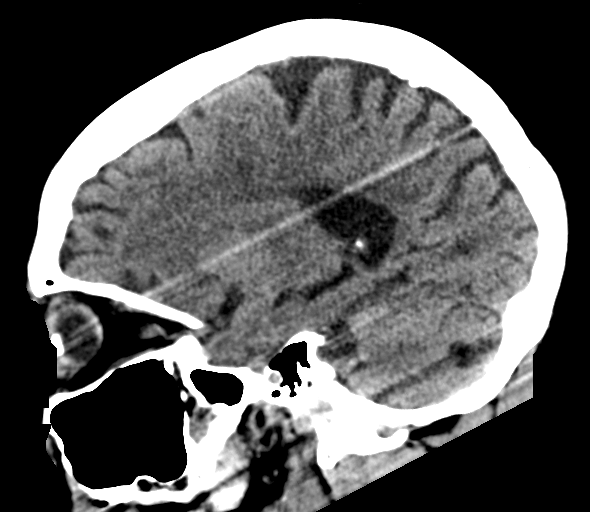

[Series 7: axial soft tissue · axial · 0.29mm/px · z∈[-328,-216]mm · 8 of 55 slices shown, 10 images]
[im 7/55  brain]
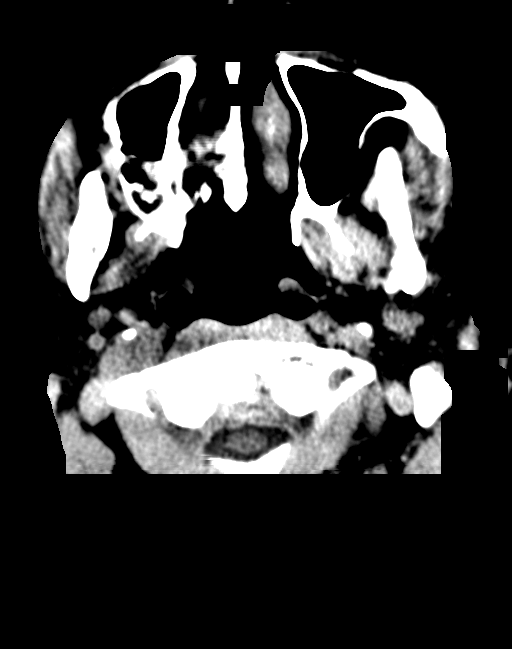
[im 7/55  bone]
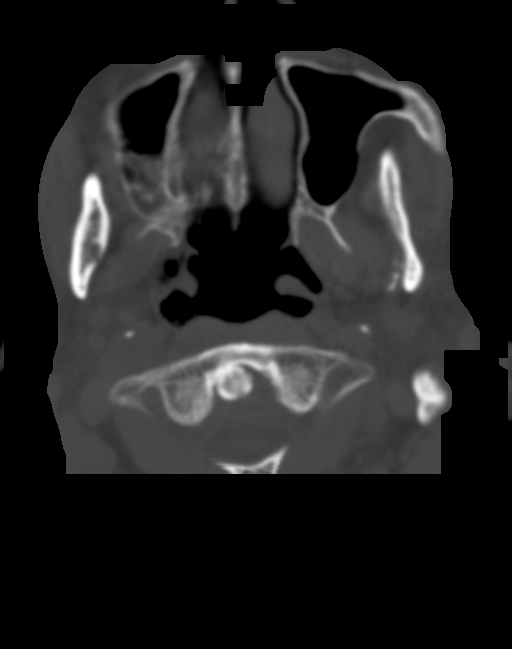
[im 13/55  brain]
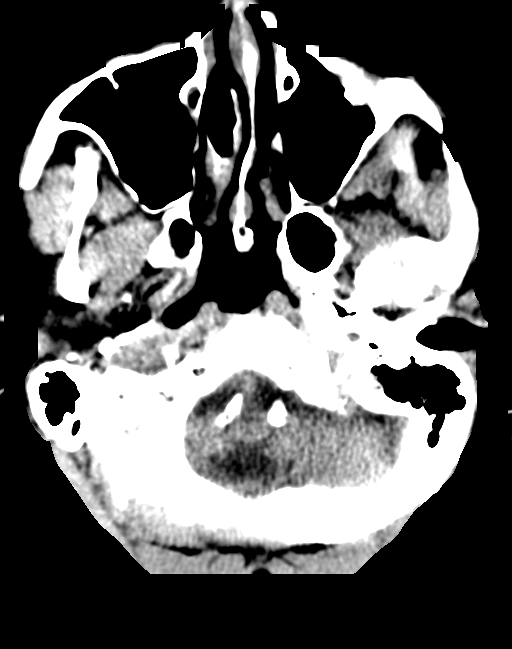
[im 19/55  brain]
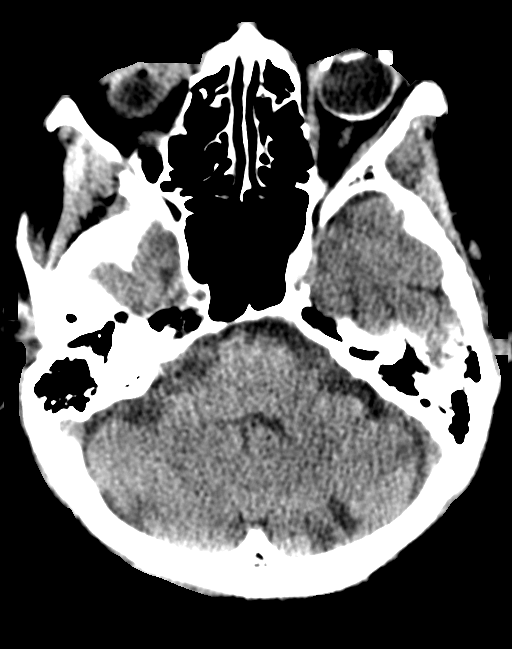
[im 25/55  brain]
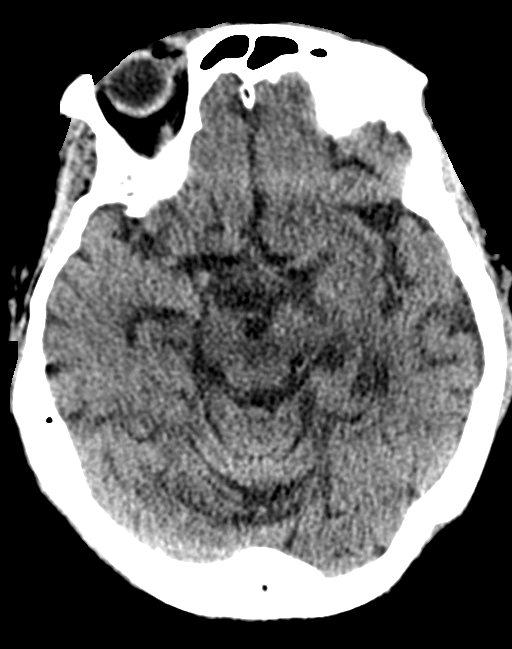
[im 31/55  brain]
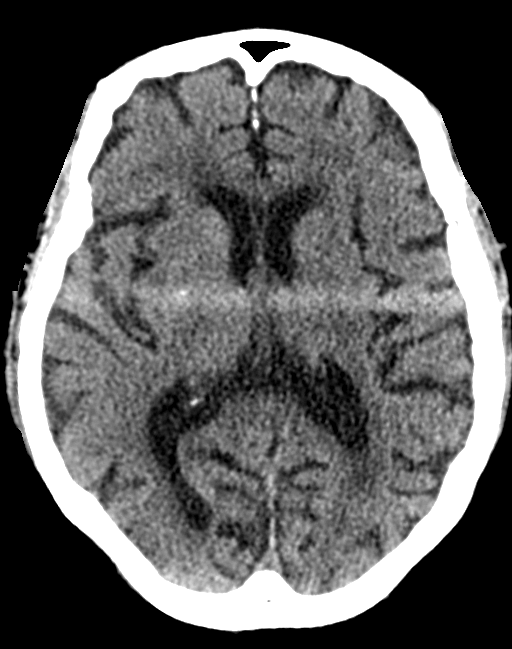
[im 31/55  bone]
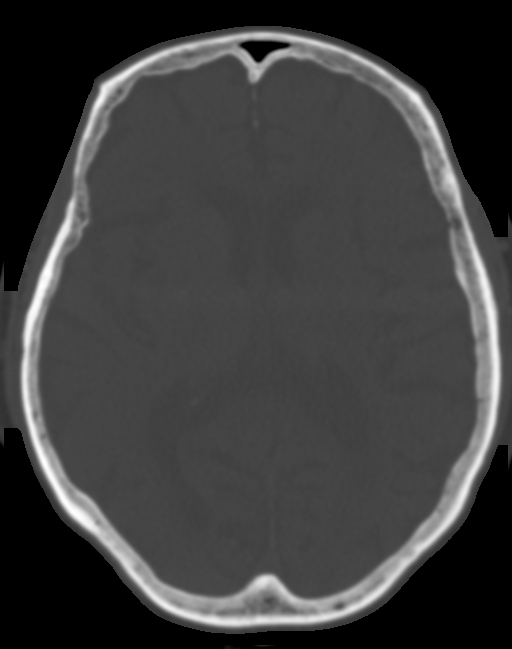
[im 37/55  brain]
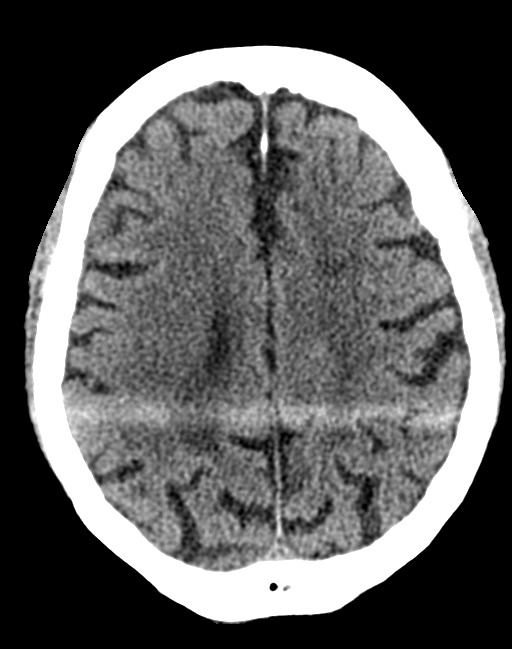
[im 43/55  brain]
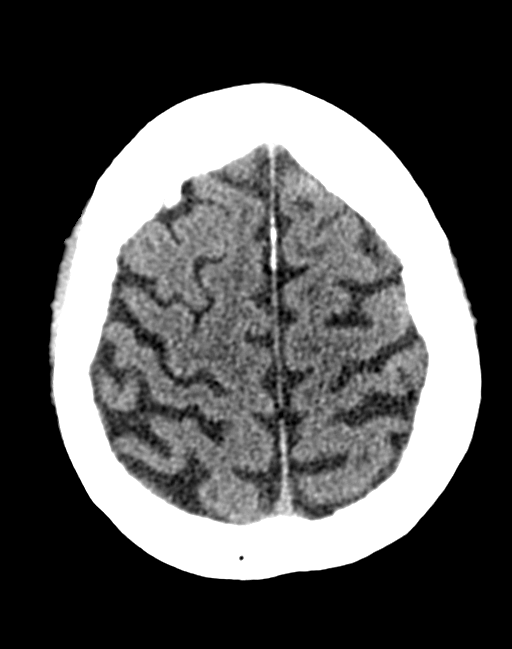
[im 49/55  brain]
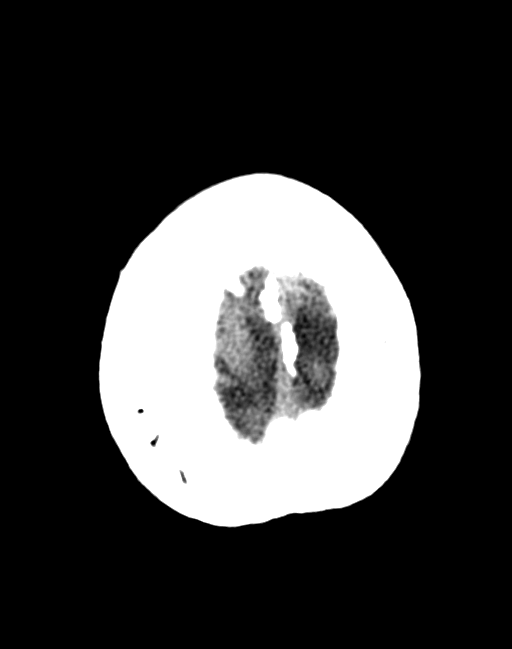

[14 of 47 positions shown; findings below may reference images not displayed]

FINDINGS: Brain: No acute territorial infarction, hemorrhage or intracranial
mass. Moderate atrophy. Patchy white matter hypodensity consistent
with chronic small vessel ischemic change. Chronic left greater than
right cerebellar infarcts.

Vascular: No hyperdense vessels. Carotid and vertebral vascular
calcification

Skull: No depressed skull fracture. Prominent degenerative change in
pannus about the C1-C2 articulation, chronic

Sinuses/Orbits: No acute finding.

Other: None
IMPRESSION: 1. No CT evidence for acute intracranial abnormality.
2. Atrophy and chronic small vessel ischemic change of the white
matter

## 2021-10-18 NOTE — Patient Outreach (Signed)
Care Coordination   Follow Up Visit Note   10/18/2021 Name: Shannon [name] MRN: [id number] DOB: [birth year]  Shannon [name] is a 87 y.o. year old female who sees Janyth Contes, Janene Harvey, NP for primary care. I spoke with  Shannon [name] by phone today  What matters to the patients health and wellness today?  Foot wound continues to heal, difficulty ordering correct G-tube supplies at times.  Daughter currently working on order, will call if need assistance.    Goals Addressed               This Visit's Progress     Ongoing foot wound healing (pt-stated)        Care Coordination Interventions: Advised patient to contact provider and or this RNCM for concerns regarding recurrent foot infection or issues with G tube supplies Reviewed scheduled/upcoming provider appointments including Podiatry on 8/28, PCP on 9/11, and AWV on 10/17        SDOH assessments and interventions completed:   Yes   Care Coordination Interventions Activated:  Yes Care Coordination Interventions:   Yes, provided  Follow up plan: No further intervention required.  Encounter Outcome:  Pt. Visit Completed  Kemper Durie, RN, MSN, CCM [facility] Care Management  [facility] Manager [phone]

## 2021-10-19 ENCOUNTER — Other Ambulatory Visit: Payer: Self-pay | Admitting: Podiatry

## 2021-10-20 ENCOUNTER — Telehealth: Payer: Self-pay

## 2021-10-20 ENCOUNTER — Other Ambulatory Visit: Payer: Self-pay | Admitting: Nurse Practitioner

## 2021-10-20 DIAGNOSIS — J449 Chronic obstructive pulmonary disease, unspecified: Secondary | ICD-10-CM

## 2021-10-20 DIAGNOSIS — H903 Sensorineural hearing loss, bilateral: Secondary | ICD-10-CM | POA: Diagnosis not present

## 2021-10-20 MED ORDER — MOMETASONE FURO-FORMOTEROL FUM 200-5 MCG/ACT IN AERO: 2.0000 | INHALATION_SPRAY | Freq: Two times a day (BID) | RESPIRATORY_TRACT | 3 refills | Status: DC

## 2021-10-20 NOTE — Telephone Encounter (Signed)
Patient daughter called and notified.

## 2021-10-20 NOTE — Telephone Encounter (Signed)
I did not make this change, it appears something was changed in visit on 09/12/21 but it was not a new order, she should be on Dulera 200-5. I will correct medication and send back to pharmacy for correct medication.

## 2021-10-20 NOTE — Telephone Encounter (Signed)
Patient daughter Soledad Gerlach called and states that patient medication Shannon [name] was sent into pharmacy as 100-71mg. She states that patient was taking the 200-541m. She wants to know why nobody notified her of this change. She wants to know if this is the new dose for patient. Message routed to PCP EuDewaine OatsJeCarlos AmericanNP

## 2021-10-21 ENCOUNTER — Telehealth: Payer: Self-pay

## 2021-10-21 NOTE — Telephone Encounter (Signed)
Nurse Elmer Picker states that medical supply company needs measurements, etc in order to give supplies. I notified her that patient has to be seen in office in order to give patient supply order and get measurements. She states that she will call patient daughter and let her know. So that she can call and schedule appointment with office . Message routed to PCP [name], [name], NP . No further action is required.

## 2021-10-21 NOTE — Telephone Encounter (Signed)
Shannon [name] a nurse from [facility]. States that this was there patient until last month and she's trying to help patient get supplies needed to manage enormous amount of drainage from G-Tube. She states that she would like to speak to someone to specify supplies. I called back and no answer. Voicemail was left with office call back number.

## 2021-10-27 ENCOUNTER — Other Ambulatory Visit: Payer: Self-pay | Admitting: Nurse Practitioner

## 2021-10-27 DIAGNOSIS — F331 Major depressive disorder, recurrent, moderate: Secondary | ICD-10-CM

## 2021-10-27 DIAGNOSIS — E1142 Type 2 diabetes mellitus with diabetic polyneuropathy: Secondary | ICD-10-CM

## 2021-10-27 NOTE — Telephone Encounter (Signed)
High risk or very high risk warning populated when attempting to refill medication. RX request sent to PCP for review and approval if warranted.   

## 2021-10-28 ENCOUNTER — Other Ambulatory Visit (HOSPITAL_COMMUNITY): Payer: Medicare Other

## 2021-11-04 ENCOUNTER — Telehealth: Payer: Self-pay | Admitting: *Deleted

## 2021-11-04 NOTE — Telephone Encounter (Signed)
Received fax from [facility] [phone] requesting order to be signed for wound care supplies: Alginate Dressing Non Sterile Gauze Non Waterproof Tape.   Placed in [place] folder to review and sign.  To be faxed back to Fax:[phone]

## 2021-11-06 IMAGING — XA IR REPLACE G/J TUBE W/ FLUORO
2 series · 8 of 8 positions shown · non-contrast
Comparison: none

INDICATION: [AGE] female with a history of malfunctioning
gastrojejunostomy tube

[Series 3: fl - angio · 4 of 31 frames shown (1 of 2)]
[frame 5/31]
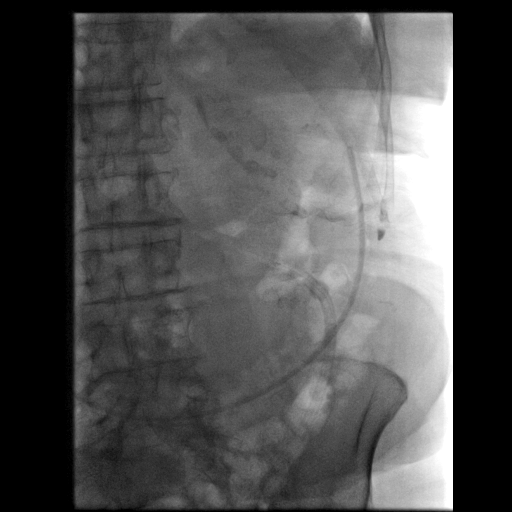
[frame 16/31]
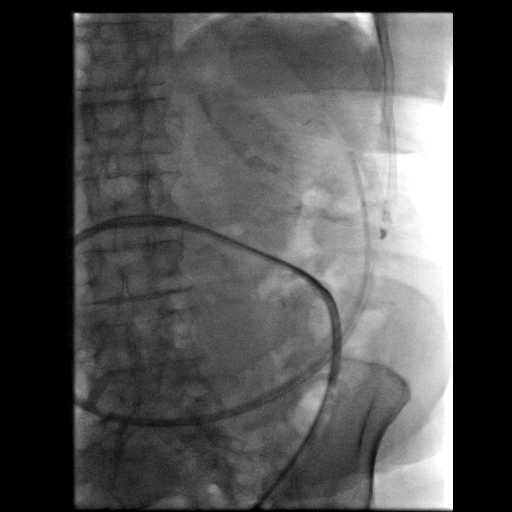
[frame 27/31]
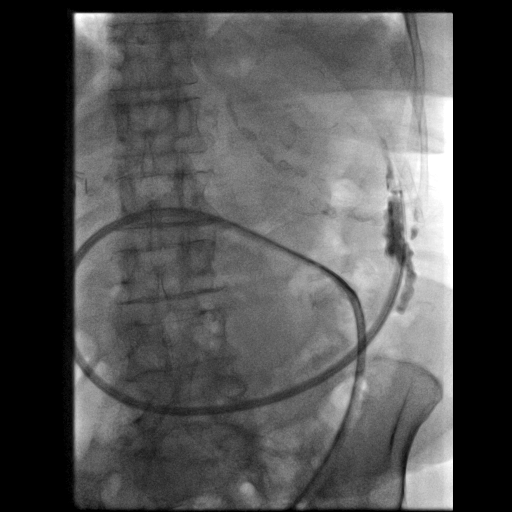
[frame 31/31]
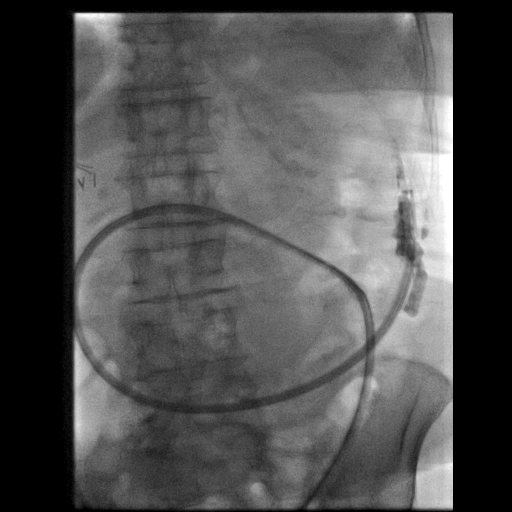

[Series 4: fl - angio · 4 of 23 frames shown (2 of 2)]
[frame 2/23]
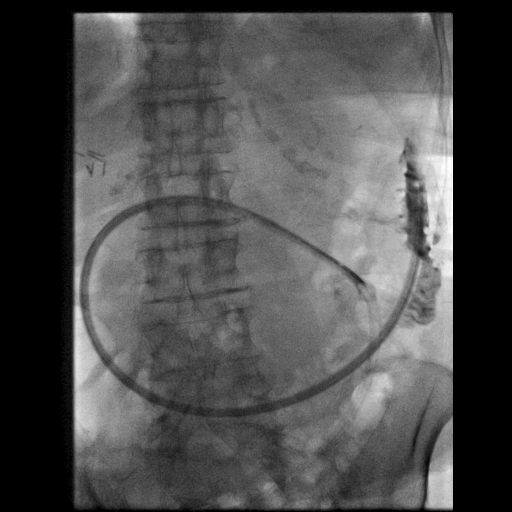
[frame 4/23]
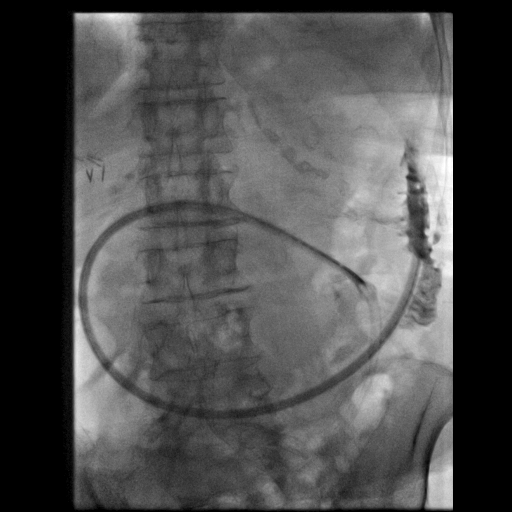
[frame 12/23]
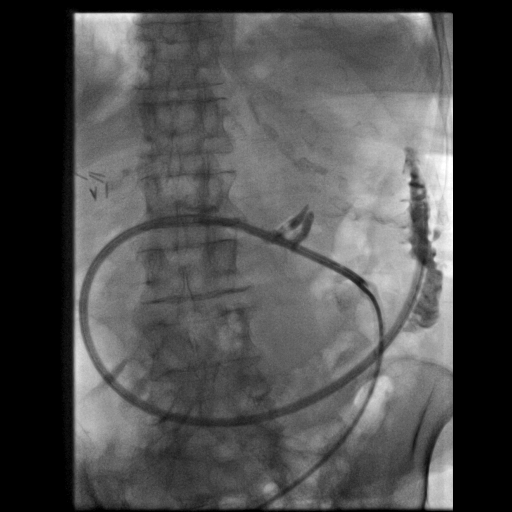
[frame 20/23]
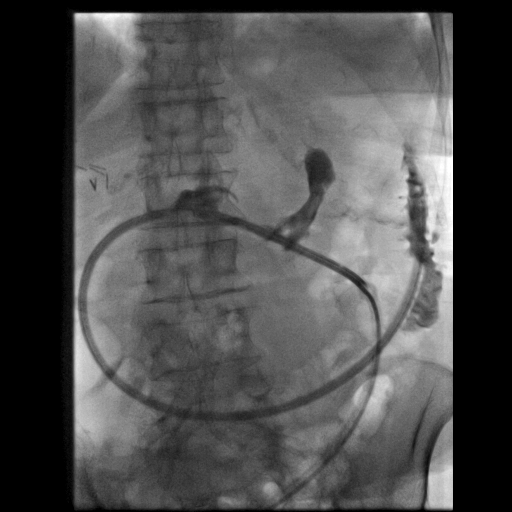

[8 of 8 positions shown; findings below may reference images not displayed]

EXAM:
IMAGE GUIDED REPLACEMENT OF GASTROJEJUNOSTOMY TUBE

MEDICATIONS:
NONE

ANESTHESIA/SEDATION:
NONE

CONTRAST:  5 cc-administered into the gastric lumen.

FLUOROSCOPY TIME:  Fluoroscopy Time: 0 minutes 30 seconds (5 mGy).

COMPLICATIONS:
None

PROCEDURE:
Informed written consent was obtained from the patient after a
thorough discussion of the procedural risks, benefits and
alternatives. All questions were addressed. Maximal Sterile Barrier
Technique was utilized including caps, mask, sterile gowns, sterile
gloves, sterile drape, hand hygiene and skin antiseptic. A timeout
was performed prior to the initiation of the procedure.

Patient positioned supine position on the fluoroscopy table. Scout
images were acquired.

Contrast was infused confirming location within the jejunum.

Balloon was deflated and using modified Seldinger technique, a new
22 French balloon retention gastrojejunostomy was placed using a
stiff Glidewire.

Balloon was inflated with 7 cc of saline. Contrast was injected into
both the jejunal limb and the gastric limb confirming location.

Patient tolerated the procedure well and remained hemodynamically
stable throughout.

No complications were encountered and no significant blood loss.
IMPRESSION: Status post routine exchange of a fractured gastrojejunostomy, with
placement of a new 22 French balloon retention GJ tube.

## 2021-11-27 NOTE — Progress Notes (Deleted)
Cardiology Office Note:    Date:  11/27/2021   ID:  [name], DOB [birth year], MRN [id number]  PCP:  Sharon Seller, NP   [facility] Providers Cardiologist:  Tobias Alexander, MD { Click to update primary MD,subspecialty MD or APP then REFRESH:1}    Referring MD: Sharon Seller, NP   Chief Complaint: ***  History of Present Illness:    Shannon [name] is a *** 88 y.o. female with a hx of chronic HFpEF, paroxysmal atrial fibrillation, gastric outlet obstruction s/p gastrojejunostomy feeding tube, nonobstructive CAD, CVA, COPD, insulin-dependent type 2 diabetes, CKD stage III, HTN, HLD, anemia of chronic disease, lower extremity PAD s/p bilateral interventions followed by [facility], hypothyroidism, and depression who was previously followed by Dr. Delton See.  Admission with syncope 11/24/2018 possible 2/2 bradycardia-Coreg was stopped.  Troponins at that time were normal, negative CT, echo normal LVEF.  Amlodipine was added for BP control.  Not seen by cardiology.  Readmitted 12/26/2018 with BRBPR in the setting of constipation felt to be a hemorrhoidal bleed.  Hemoglobin stable at 10.1, Coumadin continued.  Carotid Dopplers were stable.  30-day monitor without pauses or arrhythmias.  Admission 08/2019 with chest pain that was determined to be musculoskeletal.  She was last seen in our office on 05/04/2021 by Dr. Shari Prows companied by her daughter.  She describes fullness in her chest especially at night when lying down.  No associated SOB.  Typically sleeps on a wedge shaped pillow.  Felt that fullness in chest was 2/2 sinuses and postnasal drip.  Daughter reported that constipation also coincided with chest fullness at times.  Has a feeding tube but is able to swallow medications. Had recently increased G-tube feedings. No longer on Coumadin due to GIB. No BB due to syncope. She was advised to return in 6 months for follow-up.  Today,    Past Medical History:  Diagnosis Date   Acute  respiratory failure (HCC)    Anemia    previous blood transfusions   Arthritis    "all over"   Asthma    Atrial flutter (HCC)    Bradycardia    requiring previous d/c of BB and reduction of amiodarone   CAD (coronary artery disease)    nonobstructive per notes   Chronic diastolic CHF (congestive heart failure) (HCC)    CKD (chronic kidney disease), stage III (HCC)    Complication of blood transfusion    "got the wrong blood type at [facility] in ~ 2015; no adverse reaction that we are aware of"/daughter, [name] (01/27/2016)   COPD (chronic obstructive pulmonary disease) (HCC)    Depression    "light case"   DVT (deep venous thrombosis) (HCC) 01/2016   a. LLE DVT 01/2016 - switched from Eliquis to Coumadin.   Dyspnea    with some activity   Gastric stenosis    a. s/p stomach tube   GERD (gastroesophageal reflux disease)    GI bleed    Headache    History of 2019 novel coronavirus disease (COVID-19)    History of blood transfusion    "several" (01/27/2016)   History of stomach ulcers    Hyperlipidemia    Hypertension    Hypothyroidism    On home oxygen therapy    "2L; 9PM til 9AM" (06/27/2017)   PAD (peripheral artery disease) (HCC)    a. prior gangrene, toe amputations, intervention.   PAF (paroxysmal atrial fibrillation) (HCC)    Paraesophageal hernia    Perforated gastric  ulcer (HCC)    Pneumonia    "a few times" (06/27/2017)   Seasonal allergies    SIADH (syndrome of inappropriate ADH production) (HCC)    [name] 01/10/2015   Small bowel obstruction (HCC)    "I don't know how many" (01/11/2015)   Stroke [facility])    several- no residual   Type II diabetes mellitus (HCC)    "related to prednisone use  for > 20 yr; once predinose stopped; no more DM RX" (01/27/2016)   UTI (urinary tract infection) 02/08/2016   Ventral hernia with bowel obstruction     Past Surgical History:  Procedure Laterality Date   ABDOMINAL AORTOGRAM N/A 09/21/2016   Procedure: Abdominal  Aortogram;  Surgeon: Maeola Harman, MD;  Location: [facility] INVASIVE CV LAB;  Service: Cardiovascular;  Laterality: N/A;   ABDOMINAL AORTOGRAM W/LOWER EXTREMITY N/A 10/20/2020   Procedure: ABDOMINAL AORTOGRAM W/LOWER EXTREMITY;  Surgeon: Leonie Douglas, MD;  Location: [facility] INVASIVE CV LAB;  Service: Cardiovascular;  Laterality: N/A;   AMPUTATION Left 09/25/2016   Procedure: AMPUTATION DIGIT- LEFT 2ND AND 3RD TOES;  Surgeon: Larina Earthly, MD;  Location: [facility] OR;  Service: Vascular;  Laterality: Left;   AMPUTATION Right 05/03/2018   Procedure: AMPUTATION RIGHT GREAT TOE;  Surgeon: Larina Earthly, MD;  Location: [facility] OR;  Service: Vascular;  Laterality: Right;   CATARACT EXTRACTION W/ INTRAOCULAR LENS  IMPLANT, BILATERAL     CHOLECYSTECTOMY OPEN     COLECTOMY     ESOPHAGOGASTRODUODENOSCOPY N/A 01/19/2014   Procedure: ESOPHAGOGASTRODUODENOSCOPY (EGD);  Surgeon: Hilarie Fredrickson, MD;  Location: [facility] ENDOSCOPY;  Service: Endoscopy;  Laterality: N/A;   ESOPHAGOGASTRODUODENOSCOPY N/A 01/20/2014   Procedure: ESOPHAGOGASTRODUODENOSCOPY (EGD);  Surgeon: Hilarie Fredrickson, MD;  Location: [facility] ENDOSCOPY;  Service: Endoscopy;  Laterality: N/A;   ESOPHAGOGASTRODUODENOSCOPY N/A 03/19/2014   Procedure: ESOPHAGOGASTRODUODENOSCOPY (EGD);  Surgeon: Rachael Fee, MD;  Location: [facility] ENDOSCOPY;  Service: Endoscopy;  Laterality: N/A;   ESOPHAGOGASTRODUODENOSCOPY N/A 07/08/2015   Procedure: ESOPHAGOGASTRODUODENOSCOPY (EGD);  Surgeon: Sherrilyn Rist, MD;  Location: [facility] ENDOSCOPY;  Service: Endoscopy;  Laterality: N/A;   ESOPHAGOGASTRODUODENOSCOPY (EGD) WITH PROPOFOL N/A 09/15/2015   Procedure: ESOPHAGOGASTRODUODENOSCOPY (EGD) WITH PROPOFOL;  Surgeon: Ruffin Frederick, MD;  Location: [facility] ENDOSCOPY;  Service: Gastroenterology;  Laterality: N/A;   ESOPHAGOGASTRODUODENOSCOPY (EGD) WITH PROPOFOL N/A 10/12/2020   Procedure: ESOPHAGOGASTRODUODENOSCOPY (EGD) WITH PROPOFOL;  Surgeon: Iva Boop, MD;  Location: [facility] ENDOSCOPY;   Service: Endoscopy;  Laterality: N/A;   FOOT SURGERY     GASTROJEJUNOSTOMY     hx/notes 01/10/2015   GASTROJEJUNOSTOMY N/A 09/23/2015   Procedure: OPEN GASTROJEJUNOSTOMY TUBE PLACEMENT;  Surgeon: De Blanch Kinsinger, MD;  Location: [facility] ORS;  Service: General;  Laterality: N/A;   GLAUCOMA SURGERY Bilateral    HEMATOMA EVACUATION Right 10/20/2020   Procedure: EVACUATION HEMATOMA RIGHT GROIN;  Surgeon: Leonie Douglas, MD;  Location: [facility] OR;  Service: Vascular;  Laterality: Right;   IR CM INJ ANY COLONIC TUBE W/FLUORO  01/05/2017   IR CM INJ ANY COLONIC TUBE W/FLUORO  06/07/2017   IR CM INJ ANY COLONIC TUBE W/FLUORO  11/05/2017   IR CM INJ ANY COLONIC TUBE W/FLUORO  09/18/2018   IR CM INJ ANY COLONIC TUBE W/FLUORO  03/06/2019   IR CM INJ ANY COLONIC TUBE W/FLUORO  03/11/2019   IR GASTR TUBE CONVERT GASTR-JEJ PER W/FL MOD SED  04/30/2020   IR GENERIC HISTORICAL  01/07/2016   IR GJ TUBE CHANGE 01/07/2016 Malachy Moan, MD [facility]  IR GENERIC HISTORICAL  01/27/2016   IR MECH REMOV OBSTRUC MAT ANY COLON TUBE W/FLUORO 01/27/2016 Richarda Overlie, MD [facility]   IR GENERIC HISTORICAL  02/07/2016   IR PATIENT EVAL TECH 0-60 MINS Darrell K Allred, PA-C [facility]   IR GENERIC HISTORICAL  02/08/2016   IR GJ TUBE CHANGE 02/08/2016 Berdine Dance, MD [facility]   IR GENERIC HISTORICAL  01/06/2016   IR GJ TUBE CHANGE 01/06/2016 [facility] OUT REF   IR GENERIC HISTORICAL  05/02/2016   IR CM INJ ANY COLONIC TUBE W/FLUORO 05/02/2016 Oley Balm, MD [facility]   IR GENERIC HISTORICAL  05/15/2016   IR GJ TUBE CHANGE 05/15/2016 Simonne Come, MD [facility]   IR GENERIC HISTORICAL  06/28/2016   IR GJ TUBE CHANGE 06/28/2016 [facility]   IR GJ TUBE CHANGE  02/20/2017   IR GJ TUBE CHANGE  05/10/2017   IR GJ TUBE CHANGE  07/06/2017   IR GJ TUBE CHANGE  08/02/2017   IR GJ TUBE CHANGE  10/15/2017   IR GJ TUBE CHANGE  12/26/2017   IR GJ TUBE CHANGE  04/08/2018   IR GJ TUBE CHANGE  06/19/2018    IR GJ TUBE CHANGE  03/03/2019   IR GJ TUBE CHANGE  06/06/2019   IR GJ TUBE CHANGE  09/26/2019   IR GJ TUBE CHANGE  01/09/2020   IR GJ TUBE CHANGE  05/12/2020   IR GJ TUBE CHANGE  10/05/2020   IR GJ TUBE CHANGE  10/14/2020   IR GJ TUBE CHANGE  10/19/2020   IR GJ TUBE CHANGE  02/25/2021   IR GJ TUBE CHANGE  03/23/2021   IR GJ TUBE CHANGE  08/29/2021   IR GJ TUBE CHANGE  08/30/2021   IR GJ TUBE CHANGE  09/05/2021   IR PATIENT EVAL TECH 0-60 MINS  10/19/2016   IR PATIENT EVAL TECH 0-60 MINS  12/25/2016   IR PATIENT EVAL TECH 0-60 MINS  01/29/2017   IR PATIENT EVAL TECH 0-60 MINS  04/04/2017   IR PATIENT EVAL TECH 0-60 MINS  04/30/2017   IR PATIENT EVAL TECH 0-60 MINS  07/31/2017   IR REPLC DUODEN/JEJUNO TUBE PERCUT W/FLUORO  11/14/2016   IR REPLC GASTRO/COLONIC TUBE PERCUT W/FLUORO  05/31/2021   IRRIGATION AND DEBRIDEMENT FOOT Bilateral 12/26/2020   Procedure: IRRIGATION AND DEBRIDEMENT FOOT AND BONE BIOPSY;  Surgeon: Vivi Barrack, DPM;  Location[facility] OR;  Service: Podiatry;  Laterality: Bilateral;   LAPAROTOMY N/A 01/20/2015   Procedure: EXPLORATORY LAPAROTOMY;  Surgeon: Abigail Miyamoto, MD;  Location: [facility] OR;  Service: General;  Laterality: N/A;   LOWER EXTREMITY ANGIOGRAPHY Left 09/21/2016   Procedure: Lower Extremity Angiography;  Surgeon: Maeola Harman, MD;  Location: [facility] INVASIVE CV LAB;  Service: Cardiovascular;  Laterality: Left;   LOWER EXTREMITY ANGIOGRAPHY Right 06/27/2017   Procedure: Lower Extremity Angiography;  Surgeon: Maeola Harman, MD;  Location: [facility] INVASIVE CV LAB;  Service: Cardiovascular;  Laterality: Right;   LOWER EXTREMITY ANGIOGRAPHY Right 12/24/2020   Procedure: LOWER EXTREMITY ANGIOGRAPHY;  Surgeon: Leonie Douglas, MD;  Location: [facility] INVASIVE CV LAB;  Service: Cardiovascular;  Laterality: Right;   LYSIS OF ADHESION N/A 01/20/2015   Procedure: LYSIS OF ADHESIONS < 1 HOUR;  Surgeon: Abigail Miyamoto, MD;  Location: [facility] OR;  Service: General;   Laterality: N/A;   PERIPHERAL VASCULAR BALLOON ANGIOPLASTY Left 09/21/2016   Procedure: Peripheral Vascular Balloon Angioplasty;  Surgeon: Maeola Harman, MD;  Location: [facility] INVASIVE CV LAB;  Service: Cardiovascular;  Laterality: Left;  drug coated balloon   PERIPHERAL VASCULAR BALLOON ANGIOPLASTY Right 06/27/2017   Procedure: PERIPHERAL VASCULAR BALLOON ANGIOPLASTY;  Surgeon: Maeola Harman, MD;  Location: [facility] INVASIVE CV LAB;  Service: Cardiovascular;  Laterality: Right;  SFA/[id number]   PERIPHERAL VASCULAR INTERVENTION Left 10/20/2020   Procedure: PERIPHERAL VASCULAR INTERVENTION;  Surgeon: Leonie Douglas, MD;  [facility] INVASIVE CV LAB;  Service: Cardiovascular;  Laterality: Left;   TONSILLECTOMY     TRANSMETATARSAL AMPUTATION Right 11/14/2019   Procedure: TRANSMETATARSAL AMPUTATION RIGHT FOOT;  Surgeon: Felecia Shelling, DPM;  Location: [facility] OR;  Service: Podiatry;  Laterality: Right;   TUBAL LIGATION     VENTRAL HERNIA REPAIR  2015   incarcerated ventral hernia ([facility] 09/2013)/notes 01/10/2015   WOUND EXPLORATION Right 10/20/2020   Procedure: PRIMARY CLOSURE COMMON FEMORAL ARTERY;  Surgeon: Leonie Douglas, MD;  Location: [facility] OR;  Service: Vascular;  Laterality: Right;    Current Medications: No outpatient medications have been marked as taking for the 11/30/21 encounter (Appointment) with Lissa Hoard Zachary George, NP.     Allergies:   Penicillins   Social History   Socioeconomic History   Marital status: Widowed    Spouse name: Not on file   Number of children: 3   Years of education: Not on file   Highest education level: Not on file  Occupational History   Not on file  Tobacco Use   Smoking status: Never    Passive exposure: Never   Smokeless tobacco: Former    Types: Snuff    Quit date: 04/03/1980   Tobacco comments:    "used snuff in my younger days"  Vaping Use   Vaping Use: Never used  Substance and Sexual Activity   Alcohol use: Never     Alcohol/week: 0.0 standard drinks of alcohol   Drug use: Never   Sexual activity: Not Currently  Other Topics Concern   Not on file  Social History Narrative   Married in 1947, lives in one story house with 2 people and no petsOccupation: ?Has a living Will, [place], and doesn't have DNRWas living with Husband (in frail health) in [place] until 09/2013.  After her [facility] admission they both came to live with daughter in [place]   Widowed   Social Determinants of Health   Financial Resource Strain: Low Risk  (10/05/2017)   Overall Financial Resource Strain (CARDIA)    Difficulty of Paying Living Expenses: Not hard at all  Food Insecurity: No Food Insecurity (08/11/2021)   Hunger Vital Sign    Worried About Running Out of Food in the Last Year: Never true    Ran Out of Food in the Last Year: Never true  Transportation Needs: No Transportation Needs (08/11/2021)   PRAPARE - Administrator, Civil Service (Medical): No    Lack of Transportation (Non-Medical): No  Physical Activity: Inactive (10/05/2017)   Exercise Vital Sign    Days of Exercise per Week: 0 days    Minutes of Exercise per Session: 0 min  Stress: No Stress Concern Present (10/05/2017)   [employer] of Occupational Health - Occupational Stress Questionnaire    Feeling of Stress : Not at all  Social Connections: Moderately Isolated (10/05/2017)   Social Connection and Isolation Panel [NHANES]    Frequency of Communication with Friends and Family: More than three times a week    Frequency of Social Gatherings with Friends and Family: More than three times a week    Attends Religious  Services: Never    Active Member of Clubs or Organizations: No    Attends Banker Meetings: Never    Marital Status: Widowed     Family History: The patient's ***family history includes Diabetes in her brother; Glaucoma in her son and son; Hypertension in her mother; Stroke in her mother. There is no history of Heart  attack, Colon cancer, or Stomach cancer.  ROS:   Please see the history of present illness.    *** All other systems reviewed and are negative.  Labs/Other Studies Reviewed:    The following studies were reviewed today:  Echo 05/19/19   1. Left ventricular ejection fraction, by estimation, is 65 to 70%. The  left ventricle has normal function. The left ventricle has no regional  wall motion abnormalities. There is mild concentric left ventricular  hypertrophy. Left ventricular diastolic  parameters are consistent with Grade I diastolic dysfunction (impaired  relaxation).   2. Right ventricular systolic function is normal. The right ventricular  size is normal. There is normal pulmonary artery systolic pressure. The  estimated right ventricular systolic pressure is 31.4 mmHg.   3. The mitral valve is degenerative. Trivial mitral valve regurgitation.   4. The aortic valve has an indeterminant number of cusps. Aortic valve  regurgitation is trivial. Mild aortic valve sclerosis is present, with no  evidence of aortic valve stenosis.   5. The inferior vena cava is normal in size with <50% respiratory  variability, suggesting right atrial pressure of 8 mmHg.   Comparison(s): A prior study was performed on 11/25/2018. No significant  change from prior study.    Recent Labs: 12/29/2020: Magnesium 2.0 03/07/2021: TSH 3.06 05/04/2021: NT-Pro BNP 769 08/29/2021: B Natriuretic Peptide 195.8 09/12/2021: ALT 16; BUN 63; Creat 1.43; Hemoglobin 9.3; Platelets 238; Potassium 5.0; Sodium 136  Recent Lipid Panel    Component Value Date/Time   CHOL 106 03/07/2021 1120   CHOL 126 04/07/2015 1015   TRIG 113 03/07/2021 1120   HDL 51 03/07/2021 1120   HDL 69 04/07/2015 1015   CHOLHDL 2.1 03/07/2021 1120   VLDL 26 12/24/2013 0456   LDLCALC 35 03/07/2021 1120     Risk Assessment/Calculations:   {Does this patient have ATRIAL FIBRILLATION?[phone]}       Physical Exam:    VS:  There were  no vitals taken for this visit.    Wt Readings from Last 3 Encounters:  09/12/21 124 lb 3.2 oz (56.3 kg)  08/30/21 130 lb 8.2 oz (59.2 kg)  06/21/21 123 lb (55.8 kg)     GEN: *** Well nourished, well developed in no acute distress HEENT: Normal NECK: No JVD; No carotid bruits CARDIAC: ***RRR, no murmurs, rubs, gallops RESPIRATORY:  Clear to auscultation without rales, wheezing or rhonchi  ABDOMEN: Soft, non-tender, non-distended MUSCULOSKELETAL:  No edema; No deformity. *** pedal pulses, ***bilaterally SKIN: Warm and dry NEUROLOGIC:  Alert and oriented x 3 PSYCHIATRIC:  Normal affect   EKG:  EKG is *** ordered today.  The ekg ordered today demonstrates ***  No BP recorded.  {Refresh Note OR Click here to enter BP  :1}***    Diagnoses:    No diagnosis found. Assessment and Plan:     Chronic HFpEF Atrial fibrillation/flutter: Hypertension:  PAD: Managed by [facility]. Last seen 06/2021 with recommendation to return in 6 months. Follow-up encouraged.   {Are you ordering a CV Procedure (e.g. stress test, cath, DCCV, TEE, etc)?   Press F2        :  [id number]}   Disposition:  Medication Adjustments/Labs and Tests Ordered: Current medicines are reviewed at length with the patient today.  Concerns regarding medicines are outlined above.  No orders of the defined types were placed in this encounter.  No orders of the defined types were placed in this encounter.   There are no Patient Instructions on file for this visit.   Signed, Levi Aland, NP  11/27/2021 1:08 PM    [facility]

## 2021-11-28 ENCOUNTER — Ambulatory Visit: Payer: Medicare Other | Admitting: Podiatry

## 2021-11-30 ENCOUNTER — Ambulatory Visit: Payer: Medicare Other | Admitting: Nurse Practitioner

## 2021-12-07 IMAGING — CR DG SHOULDER 2+V*L*
2 series · 2 of 2 positions shown · non-contrast
Comparison: None.

CLINICAL DATA: Atraumatic left shoulder pain.

EXAM:
LEFT SHOULDER - 2+ VIEW

[x shoulder ap left (1 of 2)]
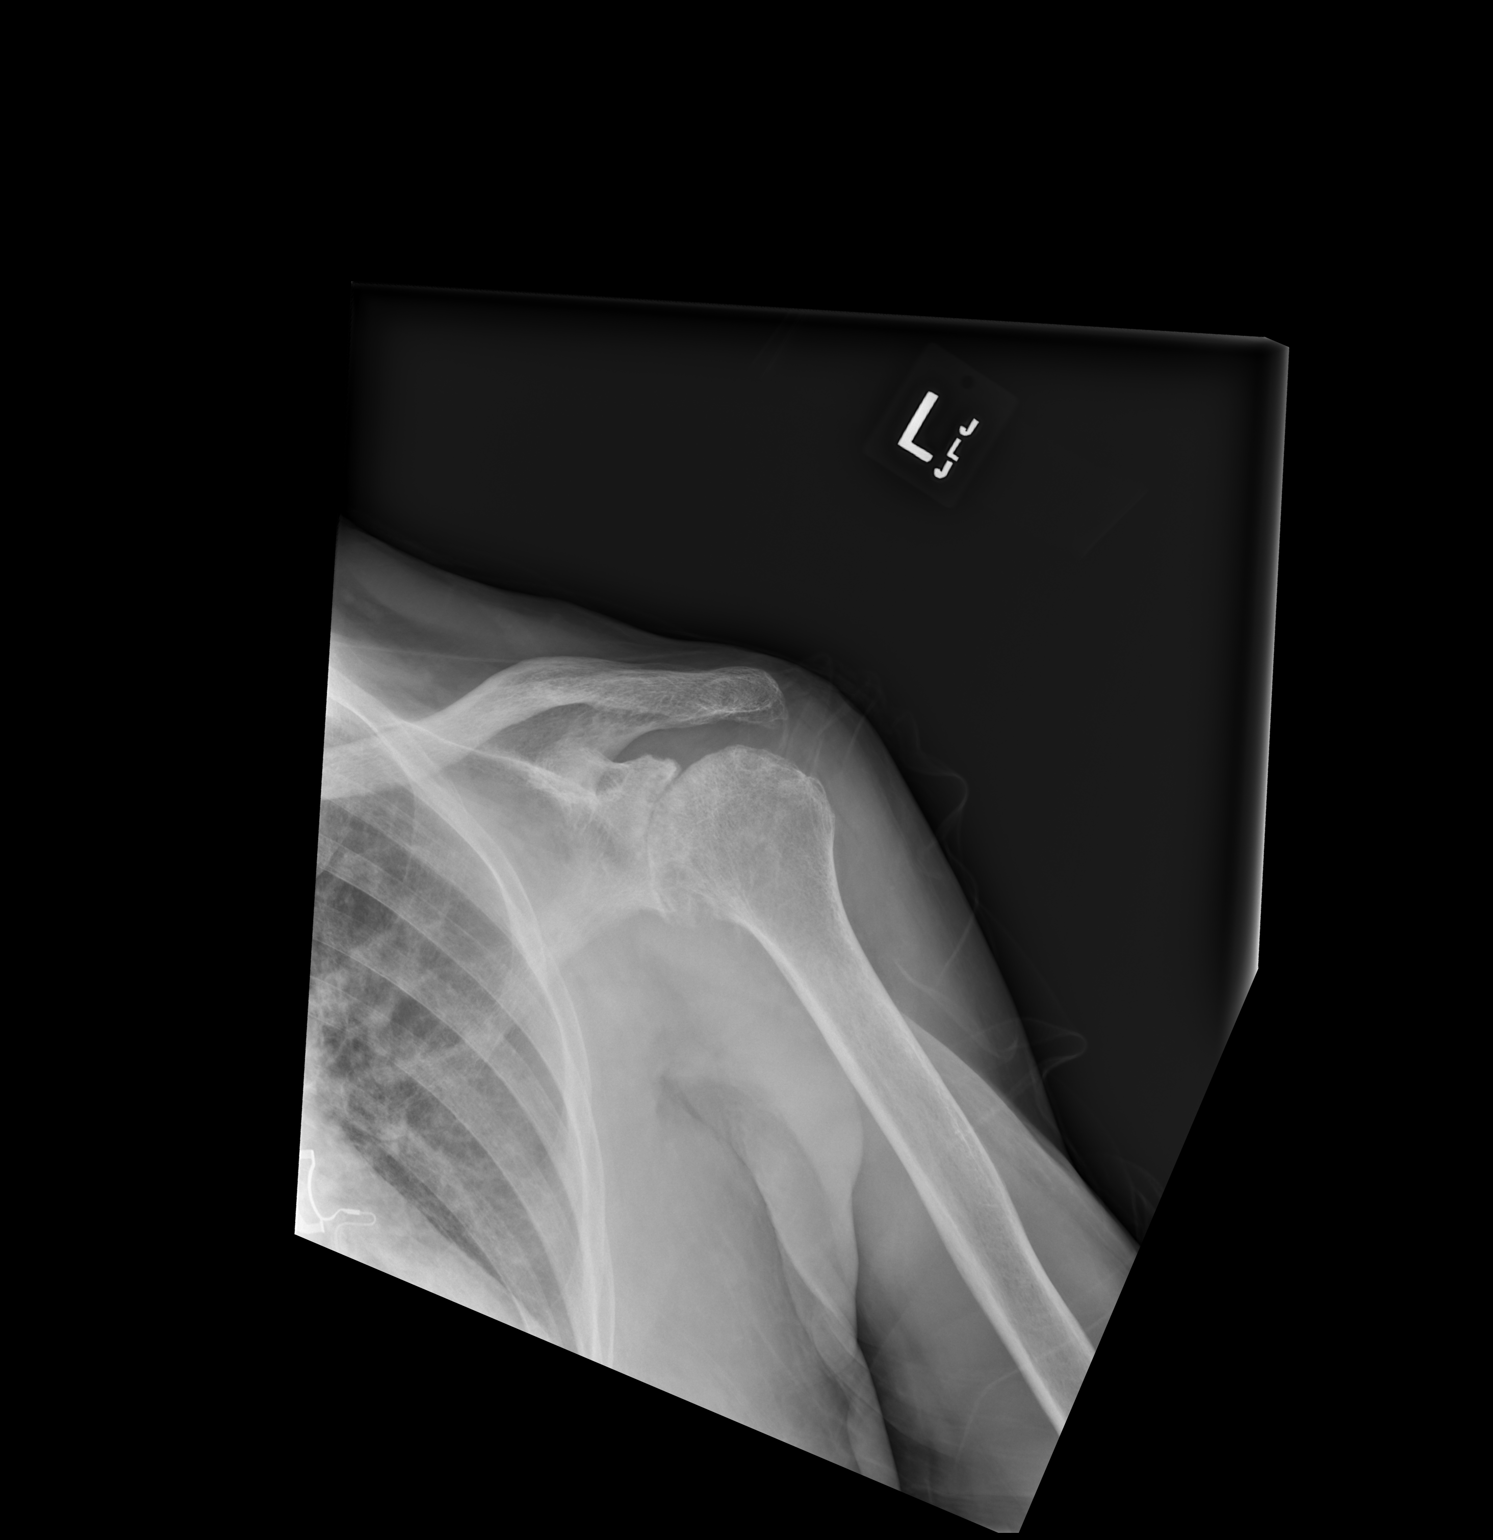

[x shoulder ap left (2 of 2)]
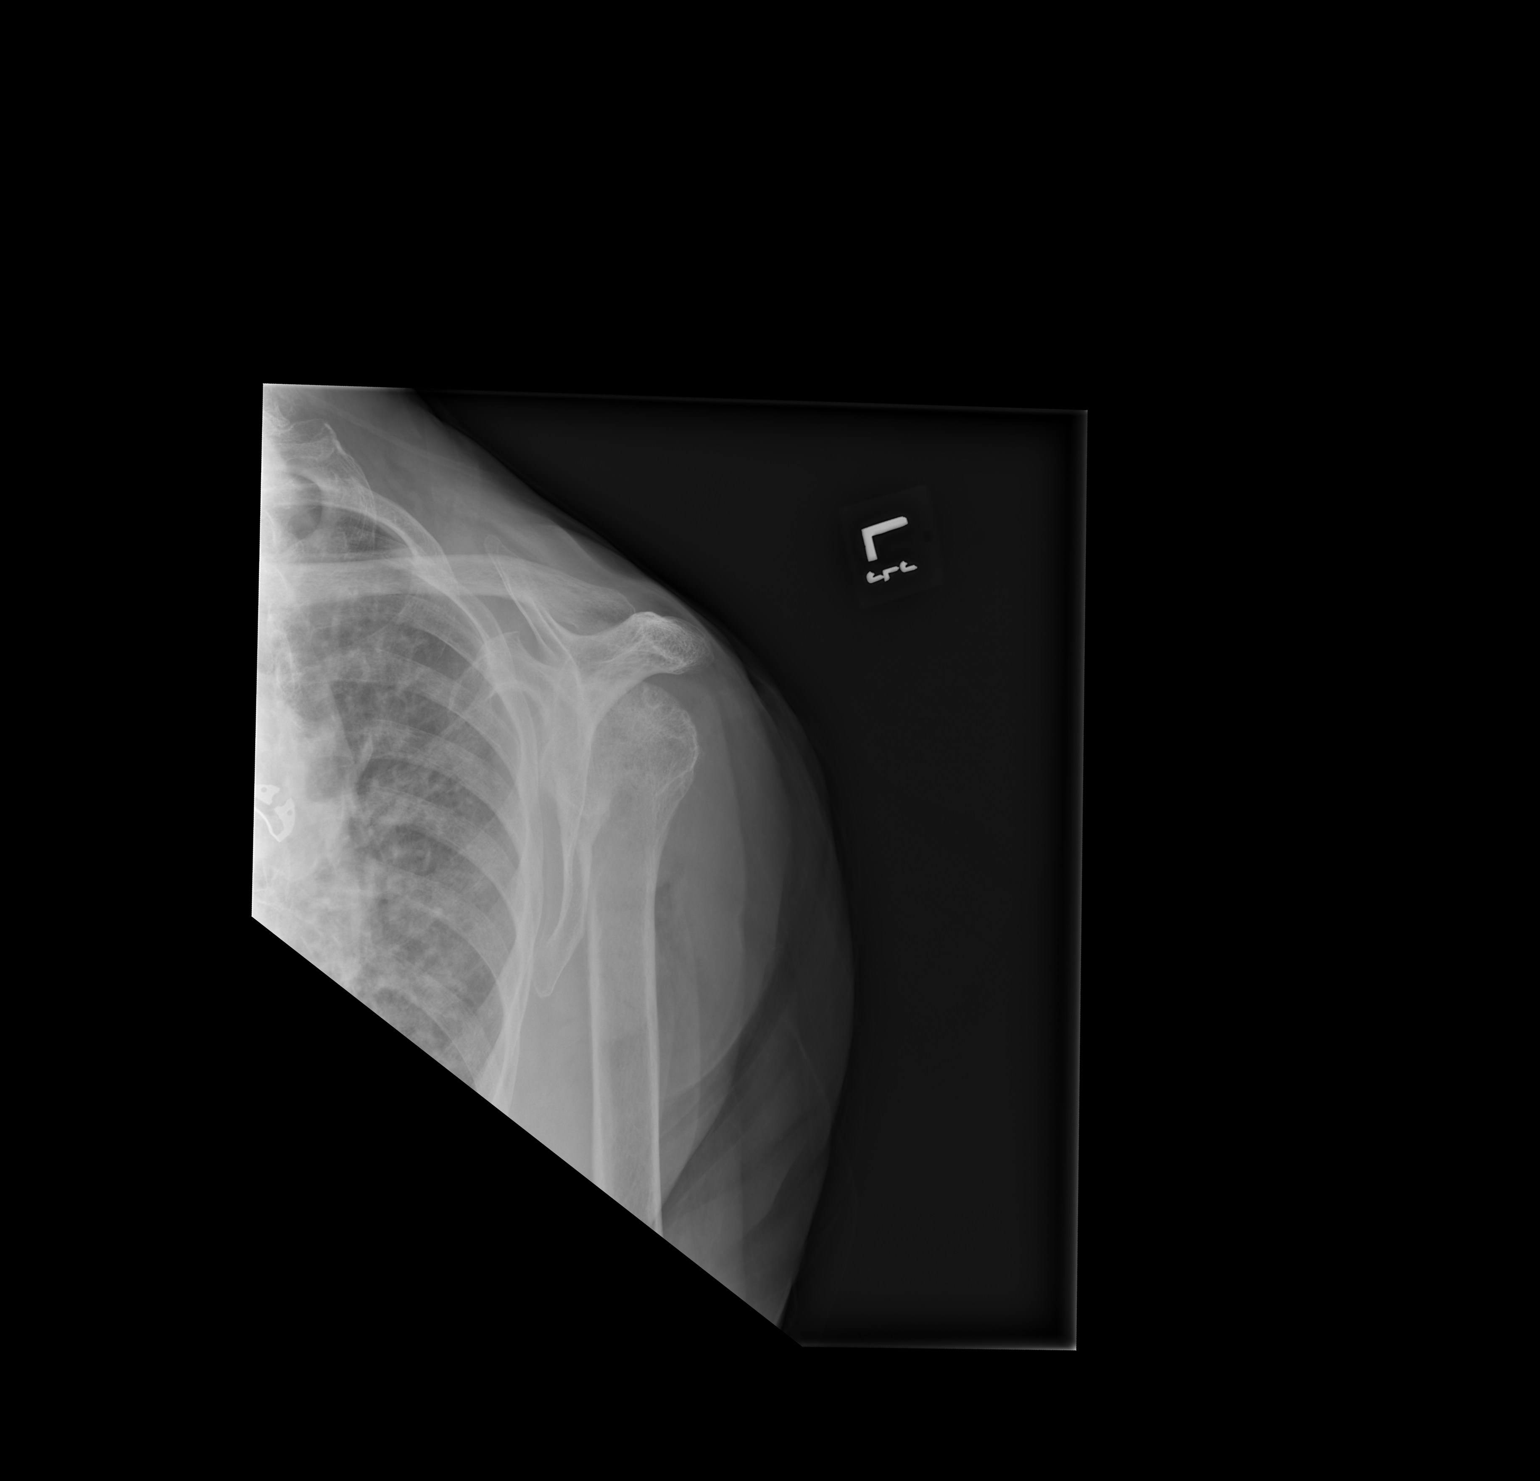

[2 of 2 positions shown; findings below may reference images not displayed]

FINDINGS: There is no evidence of fracture or dislocation. There is marked
severity narrowing of the left glenohumeral articulation with marked
severity adjacent degenerative changes involving the left humeral
head and left glenoid. Soft tissues are unremarkable.
IMPRESSION: Marked degenerative changes of the left shoulder.

## 2021-12-07 IMAGING — CR DG HUMERUS 2V *L*
2 series · 2 of 2 positions shown · non-contrast
Comparison: None.

CLINICAL DATA: Atraumatic left arm pain.

EXAM:
LEFT HUMERUS - 2+ VIEW

[x humerus ap left]
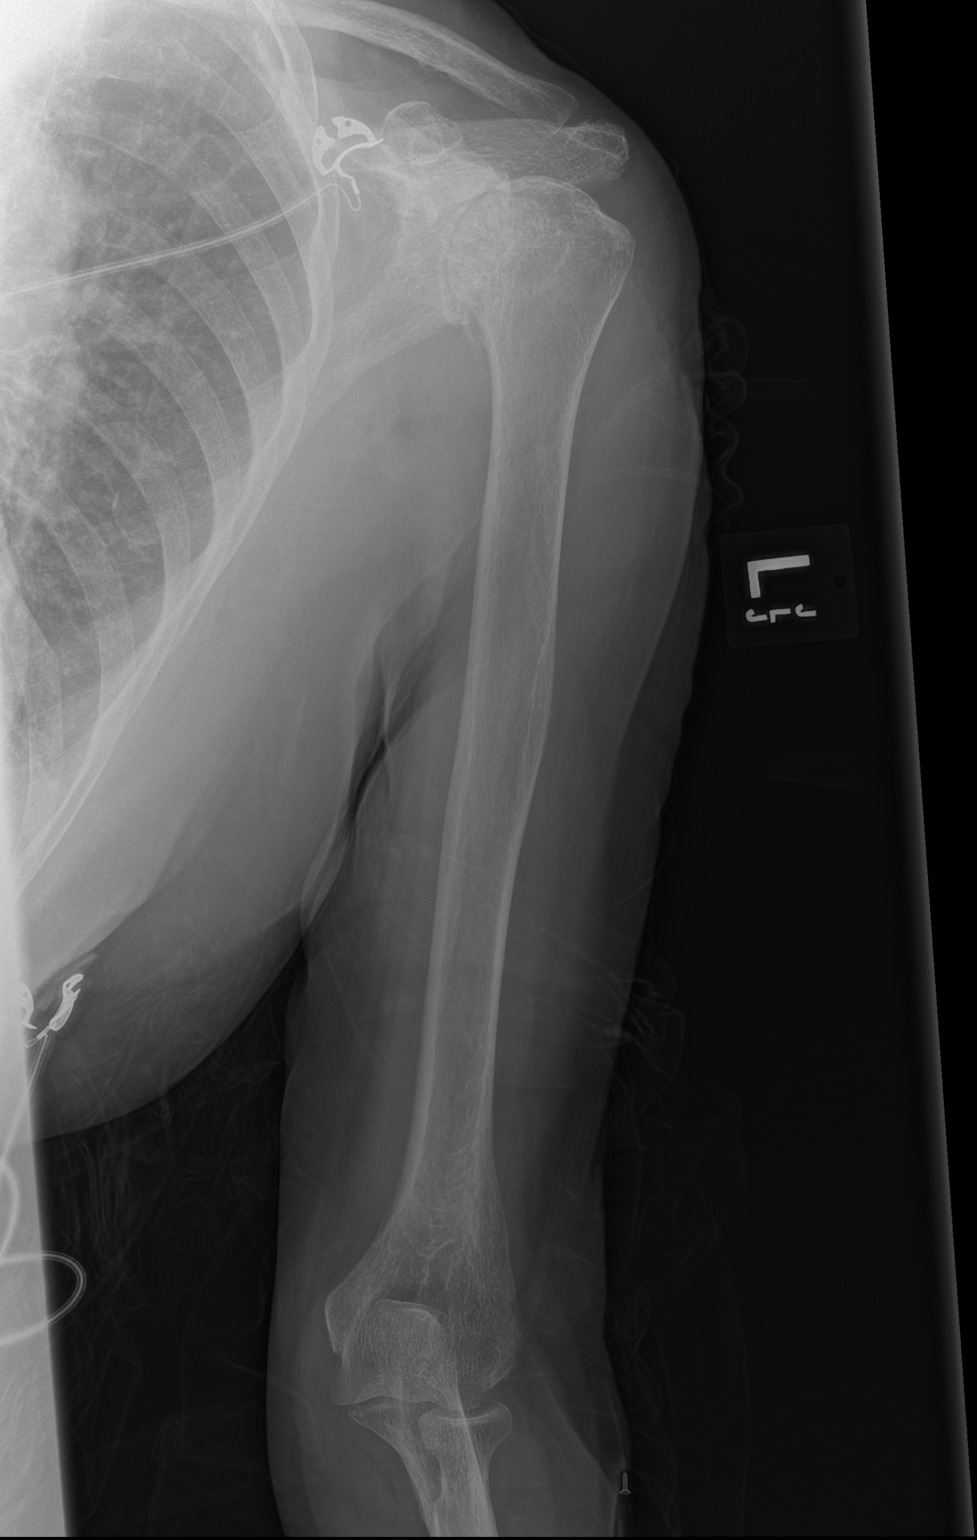

[x humerus lat left]
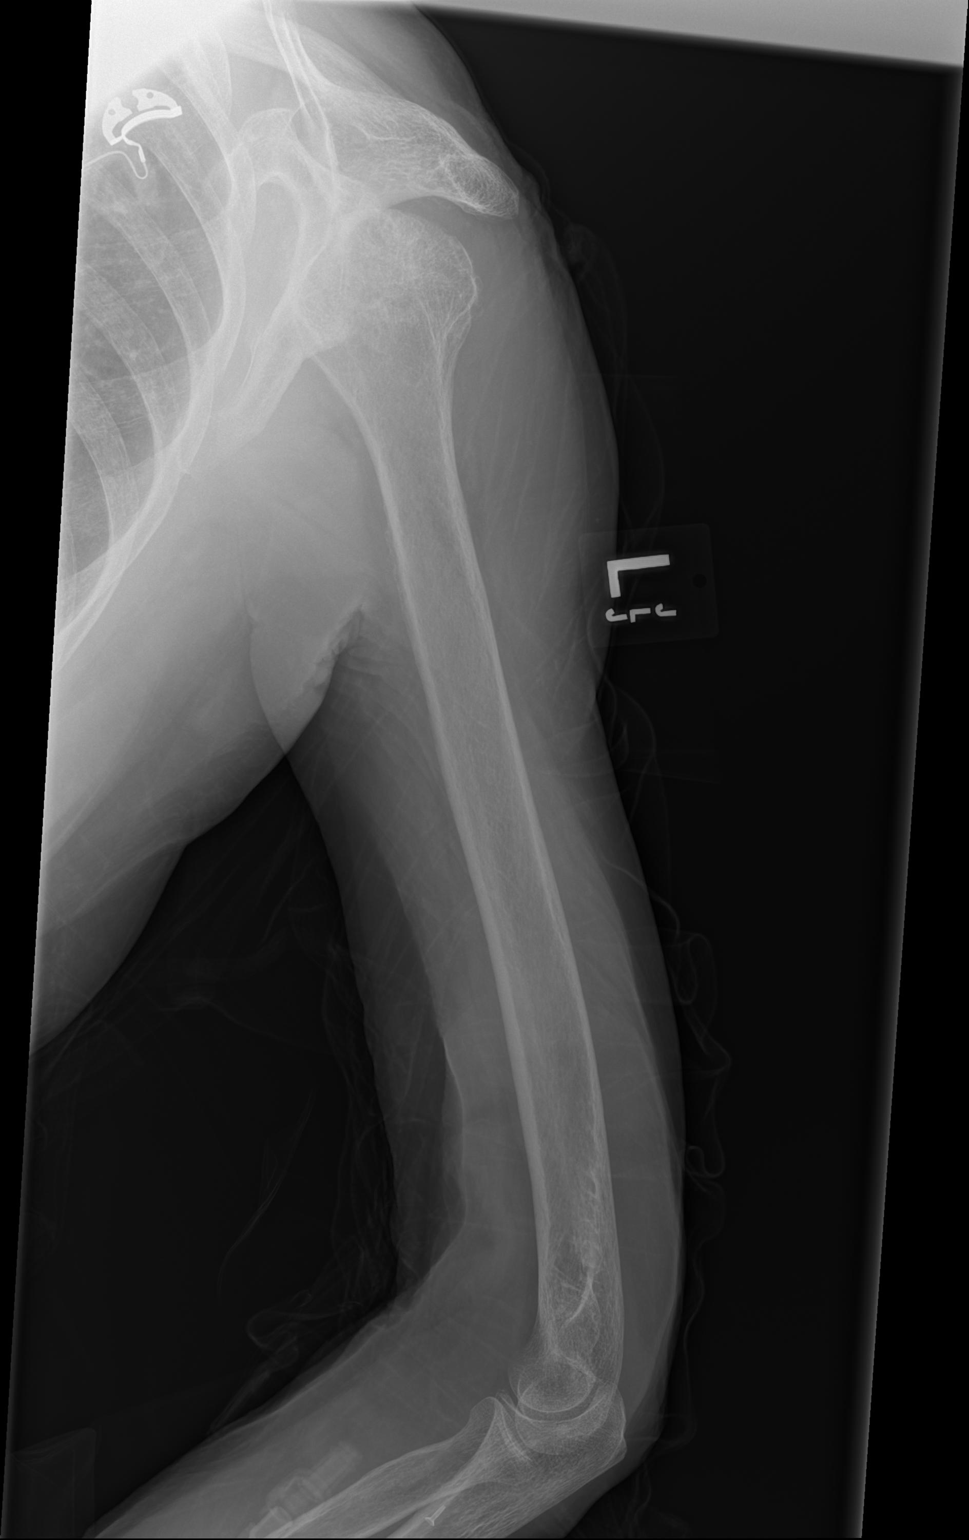

[2 of 2 positions shown; findings below may reference images not displayed]

FINDINGS: There is no evidence of fracture or other focal bone lesions. Marked
severity degenerative changes seen involving the left shoulder. Soft
tissues are unremarkable.
IMPRESSION: Marked severity degenerative changes involving the left shoulder,
without evidence of acute osseous abnormality.

## 2021-12-07 IMAGING — CT CT ANGIO CHEST-ABD-PELV FOR DISSECTION W/ AND WO/W CM
2 of 8 series · 13 of 46 positions shown, 15 images · non-contrast
Comparison: 12/25/1998

CLINICAL DATA: Chest pain. Abdominal pain.

EXAM:
CT ANGIOGRAPHY CHEST, ABDOMEN AND PELVIS
TECHNIQUE: Non-contrast CT of the chest was initially obtained.

[Series 6: axial arterial · axial · arterial · 0.65mm/px · z∈[-560,-68]mm · 10 of 202 slices shown, 12 images]
[im 19/202  soft-tissue]
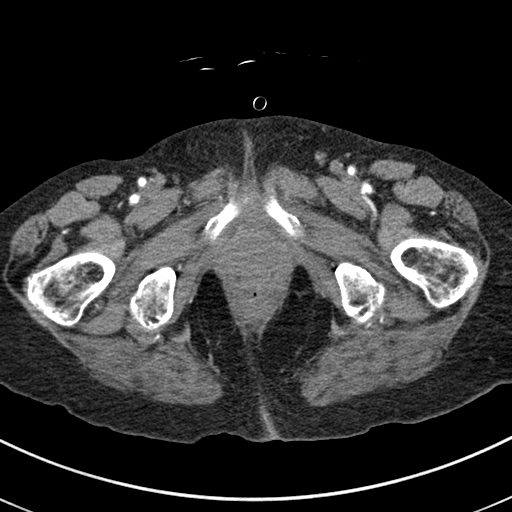
[im 19/202  bone]
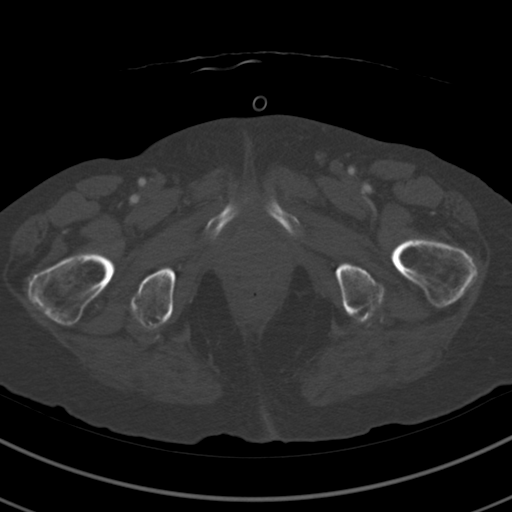
[im 37/202  soft-tissue]
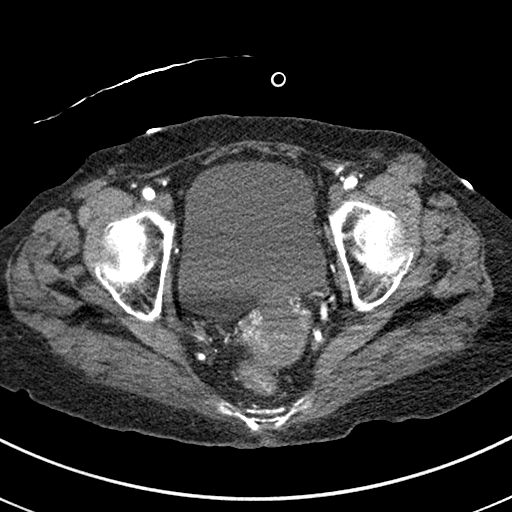
[im 55/202  soft-tissue]
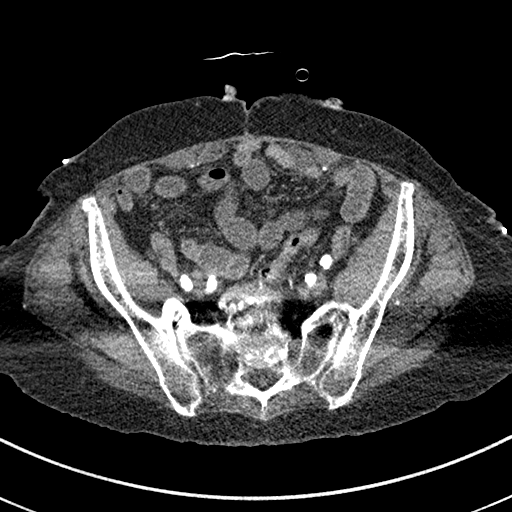
[im 74/202  soft-tissue]
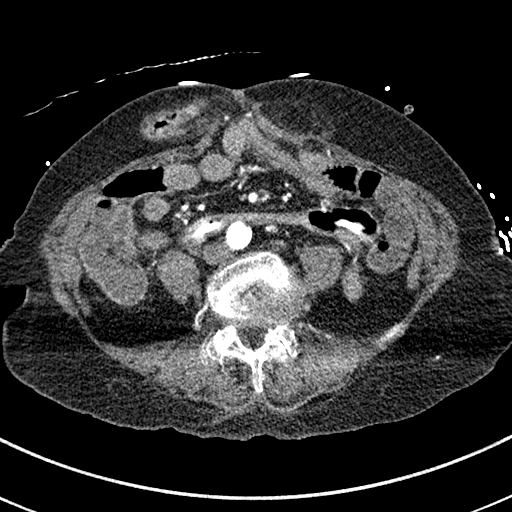
[im 92/202  soft-tissue]
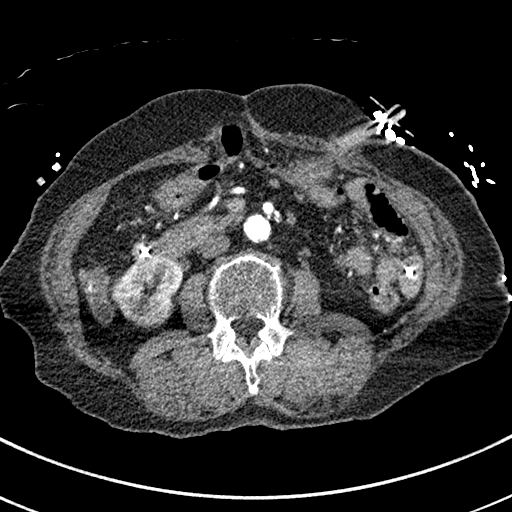
[im 110/202  soft-tissue]
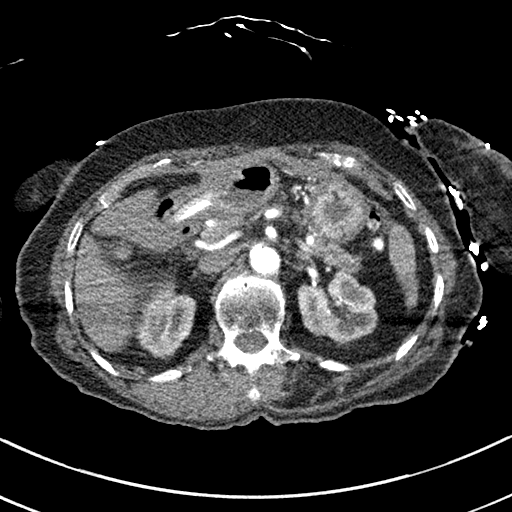
[im 128/202  soft-tissue]
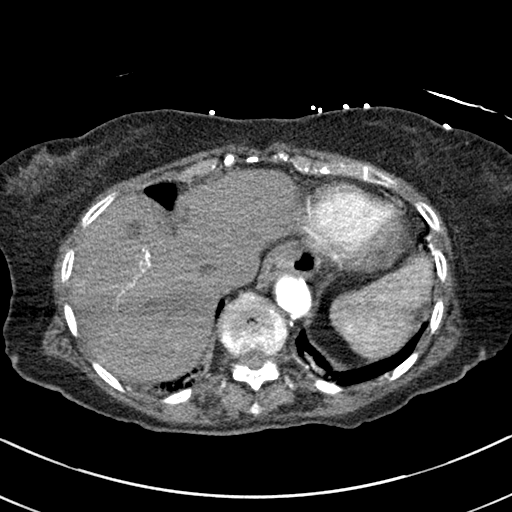
[im 147/202  soft-tissue]
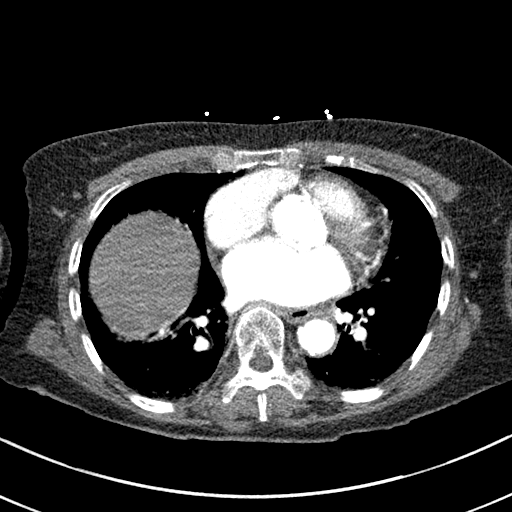
[im 165/202  soft-tissue]
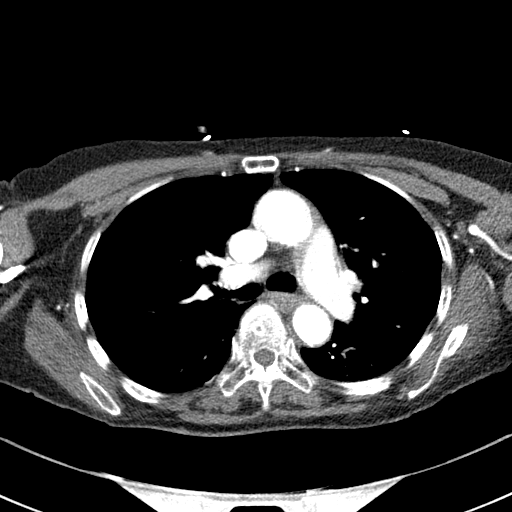
[im 165/202  bone]
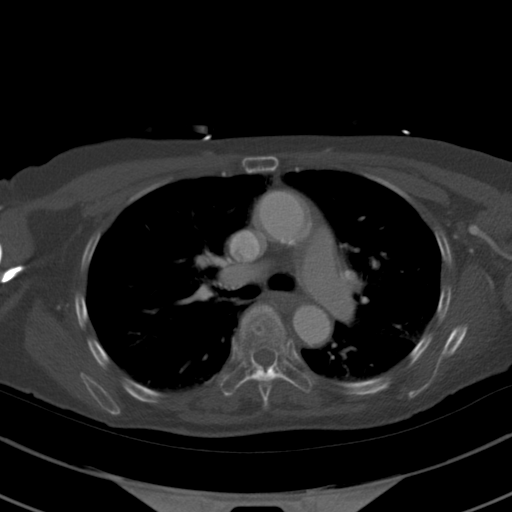
[im 183/202  soft-tissue]
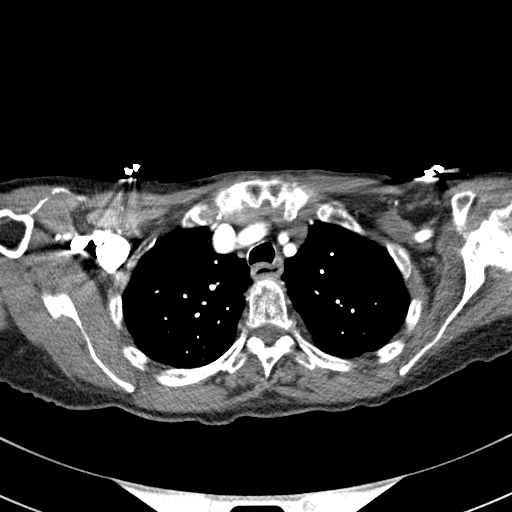

[Series 9: coronals · coronal · 0.80mm/px · 3 of 134 slices shown]
[im 34/134  soft-tissue]
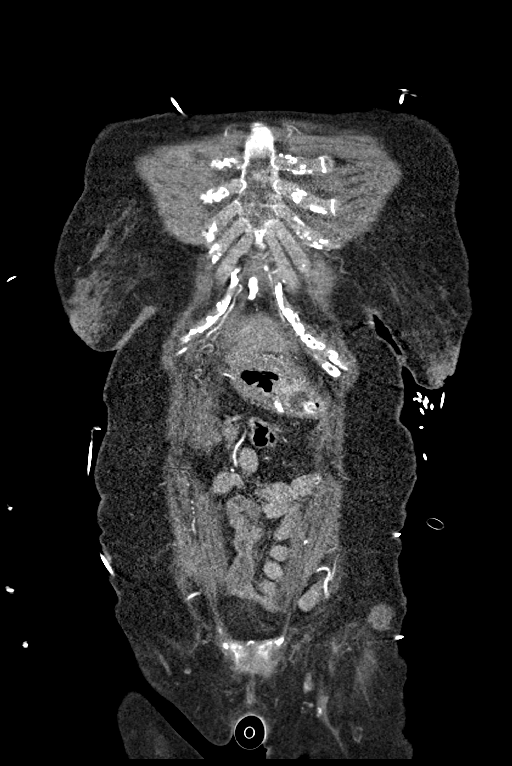
[im 67/134  soft-tissue]
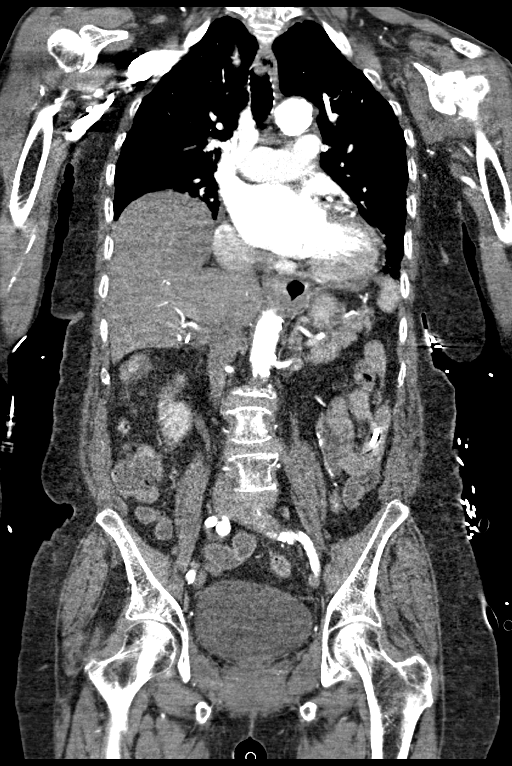
[im 100/134  soft-tissue]
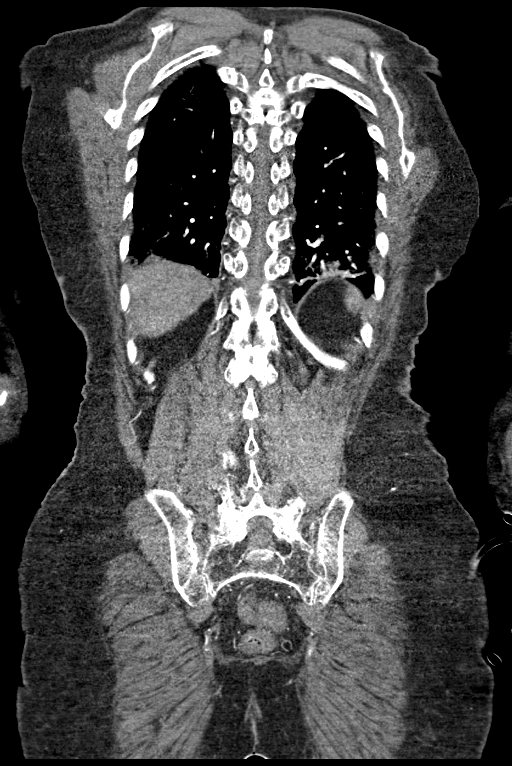

[13 of 46 positions shown; findings below may reference images not displayed]

Multidetector CT imaging through the chest, abdomen and pelvis was
performed using the standard protocol during bolus administration of
intravenous contrast. Multiplanar reconstructed images and MIPs were
obtained and reviewed to evaluate the vascular anatomy.

CONTRAST:  80mL OMNIPAQUE IOHEXOL 350 MG/ML SOLN
FINDINGS: CTA CHEST FINDINGS

Cardiovascular: Heart size is enlarged. Aortic calcifications are
noted. There is no dissection. No aneurysm.

Mediastinum/Nodes:

--No mediastinal or hilar lymphadenopathy.

--No axillary lymphadenopathy.

--No supraclavicular lymphadenopathy.

--Normal thyroid gland.

--The esophagus is unremarkable

Lungs/Pleura: There is some mild bronchial wall thickening and mucus
plugging at the left lung base. There is no focal infiltrate. No
pneumothorax. No large pleural effusion.

Musculoskeletal: No chest wall abnormality. No acute or significant
osseous findings. There is mild chronic height loss of the T11
vertebral body.

Review of the MIP images confirms the above findings.

CTA ABDOMEN AND PELVIS FINDINGS

VASCULAR

Aorta: Normal caliber aorta without aneurysm, dissection, vasculitis
or significant stenosis.

Celiac: There is moderate narrowing at the origin of the celiac
axis.

SMA: Patent without evidence of aneurysm, dissection, vasculitis or
significant stenosis.

Renals: Both renal arteries are patent without evidence of aneurysm,
dissection, vasculitis, fibromuscular dysplasia or significant
stenosis.

IMA: Patent without evidence of aneurysm, dissection, vasculitis or
significant stenosis.

Inflow: Patent without evidence of aneurysm, dissection, vasculitis
or significant stenosis.

Veins: No obvious venous abnormality within the limitations of this
arterial phase study.

Review of the MIP images confirms the above findings.

NON-VASCULAR

Hepatobiliary: There are small indeterminate hypoattenuating and
hyperattenuating lesions in the liver. These are favored to
represent a combination of cysts and flash hemangiomas. Status post
cholecystectomy.There is no biliary ductal dilation.

Pancreas: Normal contours without ductal dilatation. No
peripancreatic fluid collection.

Spleen: Unremarkable.

Adrenals/Urinary Tract:

--Adrenal glands: Unremarkable.

--Right kidney/ureter: No hydronephrosis or radiopaque kidney
stones.

--Left kidney/ureter: No hydronephrosis or radiopaque kidney stones.

--Urinary bladder: Unremarkable.

Stomach/Bowel:

--Stomach/Duodenum: A percutaneous gastrojejunostomy tube is noted.
The tube is well position.

--Small bowel: Unremarkable.

--Colon: There is scattered colonic diverticula without CT evidence
for diverticulitis. There is a ventral wall hernia containing a loop
of the transverse colon without evidence for obstruction.

--Appendix: Not visualized. No right lower quadrant inflammation or
free fluid.

Lymphatic:

--No retroperitoneal lymphadenopathy.

--No mesenteric lymphadenopathy.

--No pelvic or inguinal lymphadenopathy.

Reproductive: Unremarkable

Other: No ascites or free air. The abdominal wall is normal.

Musculoskeletal. No acute displaced fractures.

Review of the MIP images confirms the above findings.
IMPRESSION: 1. No evidence of acute aortic syndrome.
2. Mild bronchial wall thickening and mucus plugging at the left
lung base may be secondary to infection or aspiration.
3. Ventral wall hernia containing a loop of transverse colon without
evidence for obstruction.
4. Scattered colonic diverticula without evidence for
diverticulitis.
5. Well-positioned gastrojejunostomy tube.
6. Aortic Atherosclerosis ([id number]).

## 2021-12-07 NOTE — Progress Notes (Signed)
Cardiology Office Note:    Date:  12/15/2021   ID:  [name], DOB [birth year], MRN [id number]  PCP:  Sharon Seller, NP   [facility] Providers Cardiologist:  Tobias Alexander, MD     Referring MD: Sharon Seller, NP   Chief Complaint: follow-up PAF, CAD  History of Present Illness:    Shannon [name] is a very pleasant 88 y.o. female with a hx of chronic HFpEF, paroxysmal atrial fibrillation, gastric outlet obstruction s/p gastrojejunostomy feeding tube, nonobstructive CAD, CVA, COPD, insulin-dependent type 2 diabetes, CKD stage III, HTN, HLD, anemia of chronic disease, lower extremity PAD s/p bilateral interventions followed by [facility], hypothyroidism, and depression who was previously followed by Dr. Delton See.  Admission with syncope 11/24/2018 possible 2/2 bradycardia-Coreg was stopped.  Troponins at that time were normal, negative CT, echo normal LVEF.  Amlodipine was added for BP control.  Not seen by cardiology.  Readmitted 12/26/2018 with BRBPR in the setting of constipation felt to be a hemorrhoidal bleed.  Hemoglobin stable at 10.1, Coumadin continued.  Carotid Dopplers were stable.  30-day monitor without pauses or arrhythmias.  Admission 08/2019 with chest pain that was determined to be musculoskeletal.  She was last seen in our office on 05/04/2021 by Dr. Shari Prows accompanied by her daughter.  She describes fullness in her chest especially at night when lying down. No associated SOB.  Typically sleeps on a wedge shaped pillow.  Felt that fullness in chest was 2/2 sinuses and postnasal drip.  Daughter reported that constipation also coincided with chest fullness at times.  Has a feeding tube but is able to swallow medications. Had recently increased G-tube feedings. No longer on Coumadin due to GIB. No BB due to syncope. She was advised to return in 6 months for follow-up.  Today, she is here with her daughter.  She uses a rolling walker for assistance. Reports intermittent  chest discomfort.  Comes and goes, is not clearly worsened with exertion or with movement. Most bothered by drainage from sinuses when she lies down to go to sleep. Feels like she cannot clear her throat. Cannot drink water at night due to G tube.  Occasional leg edema for which she increases Lasix  from 20 to 40 mg. Neuropathy pain in both legs. Feels that her breathing is stable. No lightheadedness, presyncope, or syncope.   Past Medical History:  Diagnosis Date   Acute respiratory failure (HCC)    Anemia    previous blood transfusions   Arthritis    "all over"   Asthma    Atrial flutter (HCC)    Bradycardia    requiring previous d/c of BB and reduction of amiodarone   CAD (coronary artery disease)    nonobstructive per notes   Chronic diastolic CHF (congestive heart failure) (HCC)    CKD (chronic kidney disease), stage III (HCC)    Complication of blood transfusion    "got the wrong blood type at [facility] in ~ 2015; no adverse reaction that we are aware of"/daughter, [name] (01/27/2016)   COPD (chronic obstructive pulmonary disease) (HCC)    Depression    "light case"   DVT (deep venous thrombosis) (HCC) 01/2016   a. LLE DVT 01/2016 - switched from Eliquis to Coumadin.   Dyspnea    with some activity   Gastric stenosis    a. s/p stomach tube   GERD (gastroesophageal reflux disease)    GI bleed    Headache    History of  2019 novel coronavirus disease (COVID-19)    History of blood transfusion    "several" (01/27/2016)   History of stomach ulcers    Hyperlipidemia    Hypertension    Hypothyroidism    On home oxygen therapy    "2L; 9PM til 9AM" (06/27/2017)   PAD (peripheral artery disease) (HCC)    a. prior gangrene, toe amputations, intervention.   PAF (paroxysmal atrial fibrillation) (HCC)    Paraesophageal hernia    Perforated gastric ulcer (HCC)    Pneumonia    "a few times" (06/27/2017)   Seasonal allergies    SIADH (syndrome of inappropriate ADH production) (HCC)     [name] 01/10/2015   Small bowel obstruction (HCC)    "I don't know how many" (01/11/2015)   Stroke (HCC)    several- no residual   Type II diabetes mellitus (HCC)    "related to prednisone use  for > 20 yr; once predinose stopped; no more DM RX" (01/27/2016)   UTI (urinary tract infection) 02/08/2016   Ventral hernia with bowel obstruction     Past Surgical History:  Procedure Laterality Date   ABDOMINAL AORTOGRAM N/A 09/21/2016   Procedure: Abdominal Aortogram;  Surgeon: Maeola Harman, MD;  Location: [facility] INVASIVE CV LAB;  Service: Cardiovascular;  Laterality: N/A;   ABDOMINAL AORTOGRAM W/LOWER EXTREMITY N/A 10/20/2020   Procedure: ABDOMINAL AORTOGRAM W/LOWER EXTREMITY;  Surgeon: Leonie Douglas, MD;  Location: [facility] INVASIVE CV LAB;  Service: Cardiovascular;  Laterality: N/A;   AMPUTATION Left 09/25/2016   Procedure: AMPUTATION DIGIT- LEFT 2ND AND 3RD TOES;  Surgeon: Larina Earthly, MD;  Location: [facility] OR;  Service: Vascular;  Laterality: Left;   AMPUTATION Right 05/03/2018   Procedure: AMPUTATION RIGHT GREAT TOE;  Surgeon: Larina Earthly, MD;  Location: [facility] OR;  Service: Vascular;  Laterality: Right;   CATARACT EXTRACTION W/ INTRAOCULAR LENS  IMPLANT, BILATERAL     CHOLECYSTECTOMY OPEN     COLECTOMY     ESOPHAGOGASTRODUODENOSCOPY N/A 01/19/2014   Procedure: ESOPHAGOGASTRODUODENOSCOPY (EGD);  Surgeon: Hilarie Fredrickson, MD;  Location: [facility]COPY;  Service: Endoscopy;  Laterality: N/A;   ESOPHAGOGASTRODUODENOSCOPY N/A 01/20/2014   Procedure: ESOPHAGOGASTRODUODENOSCOPY (EGD);  Surgeon: Hilarie Fredrickson, MD;  Location: [facility];  Service: Endoscopy;  Laterality: N/A;   ESOPHAGOGASTRODUODENOSCOPY N/A 03/19/2014   Procedure: ESOPHAGOGASTRODUODENOSCOPY (EGD);  Surgeon: Rachael Fee, MD;  Location: [facility];  Service: Endoscopy;  Laterality: N/A;   ESOPHAGOGASTRODUODENOSCOPY N/A 07/08/2015   Procedure: ESOPHAGOGASTRODUODENOSCOPY (EGD);  Surgeon: Sherrilyn Rist, MD;  Location:  [facility] ENDOSCOPY;  Service: Endoscopy;  Laterality: N/A;   ESOPHAGOGASTRODUODENOSCOPY (EGD) WITH PROPOFOL N/A 09/15/2015   Procedure: ESOPHAGOGASTRODUODENOSCOPY (EGD) WITH PROPOFOL;  Surgeon: Ruffin Frederick, MD;  Location: [facility] ENDOSCOPY;  Service: Gastroenterology;  Laterality: N/A;   ESOPHAGOGASTRODUODENOSCOPY (EGD) WITH PROPOFOL N/A 10/12/2020   Procedure: ESOPHAGOGASTRODUODENOSCOPY (EGD) WITH PROPOFOL;  Surgeon: Iva Boop, MD;  Location: [facility] ENDOSCOPY;  Service: Endoscopy;  Laterality: N/A;   FOOT SURGERY     GASTROJEJUNOSTOMY     hx/notes 01/10/2015   GASTROJEJUNOSTOMY N/A 09/23/2015   Procedure: OPEN GASTROJEJUNOSTOMY TUBE PLACEMENT;  Surgeon: De Blanch Kinsinger, MD;  Location: [facility] ORS;  Service: General;  Laterality: N/A;   GLAUCOMA SURGERY Bilateral    HEMATOMA EVACUATION Right 10/20/2020   Procedure: EVACUATION HEMATOMA RIGHT GROIN;  Surgeon: Leonie Douglas, MD;  Location: [facility] OR;  Service: Vascular;  Laterality: Right;   IR CM INJ ANY COLONIC TUBE W/FLUORO  01/05/2017   IR CM INJ ANY  COLONIC TUBE W/FLUORO  06/07/2017   IR CM INJ ANY COLONIC TUBE W/FLUORO  11/05/2017   IR CM INJ ANY COLONIC TUBE W/FLUORO  09/18/2018   IR CM INJ ANY COLONIC TUBE W/FLUORO  03/06/2019   IR CM INJ ANY COLONIC TUBE W/FLUORO  03/11/2019   IR GASTR TUBE CONVERT GASTR-JEJ PER W/FL MOD SED  04/30/2020   IR GENERIC HISTORICAL  01/07/2016   IR GJ TUBE CHANGE 01/07/2016 Malachy Moan, MD [facility]   IR GENERIC HISTORICAL  01/27/2016   IR MECH REMOV OBSTRUC MAT ANY COLON TUBE W/FLUORO 01/27/2016 Richarda Overlie, MD [facility]   IR GENERIC HISTORICAL  02/07/2016   IR PATIENT EVAL TECH 0-60 MINS Darrell K Allred, PA-C [facility]   IR GENERIC HISTORICAL  02/08/2016   IR GJ TUBE CHANGE 02/08/2016 Berdine Dance, MD [facility]   IR GENERIC HISTORICAL  01/06/2016   IR GJ TUBE CHANGE 01/06/2016 [facility] OUT REF   IR GENERIC HISTORICAL  05/02/2016   IR CM INJ ANY COLONIC TUBE W/FLUORO 05/02/2016 Oley Balm, MD [facility]   IR GENERIC HISTORICAL  05/15/2016   IR GJ TUBE CHANGE 05/15/2016 Simonne Come, MD [facility]   IR GENERIC HISTORICAL  06/28/2016   IR GJ TUBE CHANGE 06/28/2016 [facility]   IR GJ TUBE CHANGE  02/20/2017   IR GJ TUBE CHANGE  05/10/2017   IR GJ TUBE CHANGE  07/06/2017   IR GJ TUBE CHANGE  08/02/2017   IR GJ TUBE CHANGE  10/15/2017   IR GJ TUBE CHANGE  12/26/2017   IR GJ TUBE CHANGE  04/08/2018   IR GJ TUBE CHANGE  06/19/2018   IR GJ TUBE CHANGE  03/03/2019   IR GJ TUBE CHANGE  06/06/2019   IR GJ TUBE CHANGE  09/26/2019   IR GJ TUBE CHANGE  01/09/2020   IR GJ TUBE CHANGE  05/12/2020   IR GJ TUBE CHANGE  10/05/2020   IR GJ TUBE CHANGE  10/14/2020   IR GJ TUBE CHANGE  10/19/2020   IR GJ TUBE CHANGE  02/25/2021   IR GJ TUBE CHANGE  03/23/2021   IR GJ TUBE CHANGE  08/29/2021   IR GJ TUBE CHANGE  08/30/2021   IR GJ TUBE CHANGE  09/05/2021   IR PATIENT EVAL TECH 0-60 MINS  10/19/2016   IR PATIENT EVAL TECH 0-60 MINS  12/25/2016   IR PATIENT EVAL TECH 0-60 MINS  01/29/2017   IR PATIENT EVAL TECH 0-60 MINS  04/04/2017   IR PATIENT EVAL TECH 0-60 MINS  04/30/2017   IR PATIENT EVAL TECH 0-60 MINS  07/31/2017   IR REPLC DUODEN/JEJUNO TUBE PERCUT W/FLUORO  11/14/2016   IR REPLC GASTRO/COLONIC TUBE PERCUT W/FLUORO  05/31/2021   IRRIGATION AND DEBRIDEMENT FOOT Bilateral 12/26/2020   Procedure: IRRIGATION AND DEBRIDEMENT FOOT AND BONE BIOPSY;  Surgeon: Vivi Barrack, DPM;  Location: [facility] OR;  Service: Podiatry;  Laterality: Bilateral;   LAPAROTOMY N/A 01/20/2015   Procedure: EXPLORATORY LAPAROTOMY;  Surgeon: Abigail Miyamoto, MD;  Location: [facility] OR;  Service: General;  Laterality: N/A;   LOWER EXTREMITY ANGIOGRAPHY Left 09/21/2016   Procedure: Lower Extremity Angiography;  Surgeon: Maeola Harman, MD;  Location: [facility] INVASIVE CV LAB;  Service: Cardiovascular;  Laterality: Left;   LOWER EXTREMITY ANGIOGRAPHY Right 06/27/2017   Procedure: Lower Extremity  Angiography;  Surgeon: Maeola Harman, MD;  Location: [facility] INVASIVE CV LAB;  Service: Cardiovascular;  Laterality: Right;   LOWER EXTREMITY ANGIOGRAPHY Right 12/24/2020   Procedure:  LOWER EXTREMITY ANGIOGRAPHY;  Surgeon: Leonie Douglas, MD;  Location: [facility] INVASIVE CV LAB;  Service: Cardiovascular;  Laterality: Right;   LYSIS OF ADHESION N/A 01/20/2015   Procedure: LYSIS OF ADHESIONS < 1 HOUR;  Surgeon: Abigail Miyamoto, MD;  Location: [facility] OR;  Service: General;  Laterality: N/A;   PERIPHERAL VASCULAR BALLOON ANGIOPLASTY Left 09/21/2016   Procedure: Peripheral Vascular Balloon Angioplasty;  Surgeon: Maeola Harman, MD;  Location: [facility] INVASIVE CV LAB;  Service: Cardiovascular;  Laterality: Left;  drug coated balloon   PERIPHERAL VASCULAR BALLOON ANGIOPLASTY Right 06/27/2017   Procedure: PERIPHERAL VASCULAR BALLOON ANGIOPLASTY;  Surgeon: Maeola Harman, MD;  Location: [facility] INVASIVE CV LAB;  Service: Cardiovascular;  Laterality: Right;  SFA/[id number]   PERIPHERAL VASCULAR INTERVENTION Left 10/20/2020   Procedure: PERIPHERAL VASCULAR INTERVENTION;  Surgeon: Leonie Douglas, MD;  Location: [facility] INVASIVE CV LAB;  Service: Cardiovascular;  Laterality: Left;   TONSILLECTOMY     TRANSMETATARSAL AMPUTATION Right 11/14/2019   Procedure: TRANSMETATARSAL AMPUTATION RIGHT FOOT;  Surgeon: Felecia Shelling, DPM;  Location: [facility] OR;  Service: Podiatry;  Laterality: Right;   TUBAL LIGATION     VENTRAL HERNIA REPAIR  2015   incarcerated ventral hernia ([facility] 09/2013)/notes 01/10/2015   WOUND EXPLORATION Right 10/20/2020   Procedure: PRIMARY CLOSURE COMMON FEMORAL ARTERY;  Surgeon: Leonie Douglas, MD;  Location: [facility] OR;  Service: Vascular;  Laterality: Right;    Current Medications: Current Meds  Medication Sig   acetaminophen (TYLENOL) 500 MG tablet Take 500 mg by mouth every 6 (six) hours as needed for moderate pain.   amLODipine (NORVASC) 5 MG tablet Take 5 mg by mouth daily.   ASPIRIN LOW  DOSE 81 MG tablet TAKE 1 TABLET (81 MG) BY MOUTH DAILY   atorvastatin (LIPITOR) 10 MG tablet Take 10 mg by mouth daily.   b complex vitamins tablet Take 1 tablet by mouth daily.    cholecalciferol (VITAMIN D3) 25 MCG (1000 UNIT) tablet Take 1,000 Units by mouth daily.   cycloSPORINE (RESTASIS) 0.05 % ophthalmic emulsion Place 1 drop into both eyes 2 (two) times daily.   doxycycline (VIBRA-TABS) 100 MG tablet Take 100 mg by mouth daily.   DULoxetine (CYMBALTA) 30 MG capsule TAKE 1 CAPSULE BY MOUTH EVERY DAY   ferrous sulfate 220 (44 Fe) MG/5ML solution TAKE 7.4 MLS (325 MG TOTAL) BY MOUTH DAILY.   fluticasone (FLONASE) 50 MCG/ACT nasal spray SPRAY 2 SPRAYS INTO EACH NOSTRIL EVERY DAY   furosemide (LASIX) 40 MG tablet TAKE 0.5-1 TABLETS (20-40 MG TOTAL) BY MOUTH SEE ADMIN INSTRUCTIONS. TAKE 20 MG DAILY, MAY INSTEAD TAKE 40 MG DAILY AS NEEDED FOR EDEMA   gabapentin (NEURONTIN) 300 MG capsule TAKE 1 CAPSULE BY MOUTH TWICE A DAY   gentamicin cream (GARAMYCIN) 0.1 % Apply 1 application topically daily as needed (dry skin).   hydrALAZINE (APRESOLINE) 50 MG tablet Take 1 tablet (50 mg total) by mouth 3 (three) times daily.   latanoprost (XALATAN) 0.005 % ophthalmic solution Place 1 drop into both eyes at bedtime.   levothyroxine (SYNTHROID) 100 MCG tablet Take 1 tablet (100 mcg total) by mouth daily.   loratadine (CLARITIN) 10 MG tablet Take 10 mg by mouth daily.   mometasone-formoterol (DULERA) 200-5 MCG/ACT AERO Inhale 2 puffs into the lungs 2 (two) times daily.   Multiple Vitamin (MULTIVITAMIN WITH MINERALS) TABS tablet Take 1 tablet by mouth daily.   Nutritional Supplements (FEEDING SUPPLEMENT, OSMOLITE 1.5 CAL,) LIQD Place 780 mLs into feeding tube See admin instructions.  Take 780 ml over a 12 hour period per day 9pm-9am at a rate of 55   nystatin cream (MYCOSTATIN) Apply 1 application  topically 2 (two) times daily.   pantoprazole (PROTONIX) 40 MG tablet Take 1 tablet (40 mg total) by mouth 2 (two)  times daily.   Polyethyl Glycol-Propyl Glycol (SYSTANE) 0.4-0.3 % GEL ophthalmic gel Place 1 application into both eyes 2 (two) times daily as needed (moisture).   polyethylene glycol powder (GLYCOLAX/MIRALAX) 17 GM/SCOOP powder Place 17 g into feeding tube at bedtime.   Probiotic Product (EQL PROBIOTIC COLON SUPPORT PO) Take 1 capsule by mouth daily.   senna-docusate (SENOKOT-S) 8.6-50 MG tablet Take 1 tablet by mouth at bedtime as needed for mild constipation.   Sodium Chloride-Sodium Bicarb (NETI POT SINUS WASH NA) Place 1 application  into the nose 2 (two) times daily.   traMADol (ULTRAM) 50 MG tablet Place 1 tablet (50 mg total) into feeding tube every 6 (six) hours as needed for severe pain.   Water For Irrigation, Sterile (FREE WATER) SOLN Place 180 mLs into feeding tube 3 (three) times daily.   White Petrolatum-Mineral Oil (SYSTANE NIGHTTIME) OINT Place 1 application into both eyes at bedtime.      Allergies:   Penicillins   Social History   Socioeconomic History   Marital status: Widowed    Spouse name: Not on file   Number of children: 3   Years of education: Not on file   Highest education level: Not on file  Occupational History   Not on file  Tobacco Use   Smoking status: Never    Passive exposure: Never   Smokeless tobacco: Former    Types: Snuff    Quit date: 04/03/1980   Tobacco comments:    "used snuff in my younger days"  Vaping Use   Vaping Use: Never used  Substance and Sexual Activity   Alcohol use: Never    Alcohol/week: 0.0 standard drinks of alcohol   Drug use: Never   Sexual activity: Not Currently  Other Topics Concern   Not on file  Social History Narrative   Married in 1947, lives in one story house with 2 people and no petsOccupation: ?Has a living Will, [place], and doesn't have DNRWas living with Husband (in frail health) in [place] until 09/2013.  After her [facility] admission they both came to live with daughter in [place]   Widowed   Social  Determinants of Health   Financial Resource Strain: Low Risk  (10/05/2017)   Overall Financial Resource Strain (CARDIA)    Difficulty of Paying Living Expenses: Not hard at all  Food Insecurity: No Food Insecurity (08/11/2021)   Hunger Vital Sign    Worried About Running Out of Food in the Last Year: Never true    Ran Out of Food in the Last Year: Never true  Transportation Needs: No Transportation Needs (08/11/2021)   PRAPARE - Administrator, Civil Service (Medical): No    Lack of Transportation (Non-Medical): No  Physical Activity: Inactive (10/05/2017)   Exercise Vital Sign    Days of Exercise per Week: 0 days    Minutes of Exercise per Session: 0 min  Stress: No Stress Concern Present (10/05/2017)   [employer] of Occupational Health - Occupational Stress Questionnaire    Feeling of Stress : Not at all  Social Connections: Moderately Isolated (10/05/2017)   Social Connection and Isolation Panel [NHANES]    Frequency of Communication with Friends and Family: More  than three times a week    Frequency of Social Gatherings with Friends and Family: More than three times a week    Attends Religious Services: Never    Database administrator or Organizations: No    Attends Banker Meetings: Never    Marital Status: Widowed     Family History: The patient's family history includes Diabetes in her brother; Glaucoma in her son and son; Hypertension in her mother; Stroke in her mother. There is no history of Heart attack, Colon cancer, or Stomach cancer.  ROS:   Please see the history of present illness.    + chest discomfort All other systems reviewed and are negative.  Labs/Other Studies Reviewed:    The following studies were reviewed today:  Echo 05/19/19   1. Left ventricular ejection fraction, by estimation, is 65 to 70%. The  left ventricle has normal function. The left ventricle has no regional  wall motion abnormalities. There is mild concentric left  ventricular  hypertrophy. Left ventricular diastolic  parameters are consistent with Grade I diastolic dysfunction (impaired  relaxation).   2. Right ventricular systolic function is normal. The right ventricular  size is normal. There is normal pulmonary artery systolic pressure. The  estimated right ventricular systolic pressure is 31.4 mmHg.   3. The mitral valve is degenerative. Trivial mitral valve regurgitation.   4. The aortic valve has an indeterminant number of cusps. Aortic valve  regurgitation is trivial. Mild aortic valve sclerosis is present, with no  evidence of aortic valve stenosis.   5. The inferior vena cava is normal in size with <50% respiratory  variability, suggesting right atrial pressure of 8 mmHg.   Comparison(s): A prior study was performed on 11/25/2018. No significant  change from prior study.    Recent Labs: 12/29/2020: Magnesium 2.0 05/04/2021: NT-Pro BNP 769 08/29/2021: B Natriuretic Peptide 195.8 12/12/2021: ALT 16; BUN 58; Creat 1.46; Hemoglobin 9.2; Platelets 238; Potassium 4.9; Sodium 138; TSH 0.08  Recent Lipid Panel    Component Value Date/Time   CHOL 96 12/12/2021 1452   CHOL 126 04/07/2015 1015   TRIG 126 12/12/2021 1452   HDL 47 (L) 12/12/2021 1452   HDL 69 04/07/2015 1015   CHOLHDL 2.0 12/12/2021 1452   VLDL 26 12/24/2013 0456   LDLCALC 28 12/12/2021 1452     Risk Assessment/Calculations:           Physical Exam:    VS:  BP 122/76   Pulse 84   Ht 5\' 6"  (1.676 m)   Wt 126 lb 6.4 oz (57.3 kg)   SpO2 97%   BMI 20.40 kg/m     Wt Readings from Last 3 Encounters:  12/15/21 126 lb 6.4 oz (57.3 kg)  12/12/21 127 lb (57.6 kg)  09/12/21 124 lb 3.2 oz (56.3 kg)     GEN:  Frail, elderly female in no acute distress HEENT: Normal NECK: No JVD; No carotid bruits CARDIAC: RRR, no murmurs, rubs, gallops RESPIRATORY:  Clear to auscultation without rales, wheezing or rhonchi  ABDOMEN: Soft, non-tender, non-distended MUSCULOSKELETAL:   Mild bilateral LE edema; No deformity. 2+ pedal pulses, equal bilaterally SKIN: Warm and dry NEUROLOGIC:  Alert and oriented x 3 PSYCHIATRIC:  Normal affect   EKG:  EKG is not ordered today.     Diagnoses:    1. Chest fullness   2. Persistent atrial fibrillation   3. Essential hypertension   4. Chronic diastolic CHF (congestive heart failure) (HCC)   5.  PAD (peripheral artery disease) (HCC)   6. Medication management    Assessment and Plan:     Chest fullness: She reports occasional chest discomfort.  It is not specifically related to exertion or movement.  Symptoms are very vague. Would favor conservative management based on age, frailty. No indication for further ischemic evaluation at this time.  Chronic HFpEF: LVEF 65-70%, mild LVH, G1DD on echo 05/2019. Mild bilateral LE edema. She increases Lasix to 40 mg daily when legs are more swollen. Does not appear volume overloaded on exam. Occasional chest discomfort. Overall symptoms are stable. No indication for further testing at this time.   Atrial fibrillation/flutter: Clinically appears to be in sinus rhythm today.  Rate is well controlled. She denies palpitations. Has occasional chest discomfort. Not on anticoagulation due to GI bleed.  Hypertension: BP is well-controlled today. BP varies at home but sounds like mostly well-controlled. No medication changes today.   PAD: Managed by [facility]. Last seen 06/2021 with recommendation to return in 6 months. Follow-up encouraged.   Medication management: Concerned expressed about polypharmacy. Patient is pretty well-informed on her medication. Encouraged her to remove any supplements that are not essential.   Disposition: 6 months with Dr. Shari Prows  Medication Adjustments/Labs and Tests Ordered: Current medicines are reviewed at length with the patient today.  Concerns regarding medicines are outlined above.  No orders of the defined types were placed in this encounter.  No orders of the  defined types were placed in this encounter.   Patient Instructions  Medication Instructions:   Try Pepcid one (1) tablet by mouth ( 20 mg) early pm to reduce secretions.   *If you need a refill on your cardiac medications before your next appointment, please call your pharmacy*   Lab Work:  None ordered.  If you have labs (blood work) drawn today and your tests are completely normal, you will receive your results only by: MyChart Message (if you have MyChart) OR A paper copy in the mail If you have any lab test that is abnormal or we need to change your treatment, we will call you to review the results.   Testing/Procedures:  None ordered.   Follow-Up: At [facility], you and your health needs are our priority.  As part of our continuing mission to provide you with exceptional heart care, we have created designated Provider Care Teams.  These Care Teams include your primary Cardiologist (physician) and Advanced Practice Providers (APPs -  Physician Assistants and Nurse Practitioners) who all work together to provide you with the care you need, when you need it.  We recommend signing up for the patient portal called "MyChart".  Sign up information is provided on this After Visit Summary.  MyChart is used to connect with patients for Virtual Visits (Telemedicine).  Patients are able to view lab/test results, encounter notes, upcoming appointments, etc.  Non-urgent messages can be sent to your provider as well.   To learn more about what you can do with MyChart, go to [email or web address].    Your next appointment:   6 month(s)  The format for your next appointment:   In Person  Provider:   Dr. Laurance Flatten    Important Information About Sugar         Signed, Levi Aland, NP  12/15/2021 4:40 PM    [facility]

## 2021-12-12 ENCOUNTER — Ambulatory Visit (INDEPENDENT_AMBULATORY_CARE_PROVIDER_SITE_OTHER): Payer: Medicare Other | Admitting: Nurse Practitioner

## 2021-12-12 ENCOUNTER — Encounter: Payer: Self-pay | Admitting: Nurse Practitioner

## 2021-12-12 VITALS — BP 134/66 | HR 79 | Temp 97.3°F | Ht 66.0 in | Wt 127.0 lb

## 2021-12-12 DIAGNOSIS — N184 Chronic kidney disease, stage 4 (severe): Secondary | ICD-10-CM

## 2021-12-12 DIAGNOSIS — E1142 Type 2 diabetes mellitus with diabetic polyneuropathy: Secondary | ICD-10-CM

## 2021-12-12 DIAGNOSIS — E782 Mixed hyperlipidemia: Secondary | ICD-10-CM

## 2021-12-12 DIAGNOSIS — L89151 Pressure ulcer of sacral region, stage 1: Secondary | ICD-10-CM

## 2021-12-12 DIAGNOSIS — E039 Hypothyroidism, unspecified: Secondary | ICD-10-CM | POA: Diagnosis not present

## 2021-12-12 DIAGNOSIS — Z931 Gastrostomy status: Secondary | ICD-10-CM

## 2021-12-12 DIAGNOSIS — I4819 Other persistent atrial fibrillation: Secondary | ICD-10-CM | POA: Diagnosis not present

## 2021-12-12 DIAGNOSIS — I10 Essential (primary) hypertension: Secondary | ICD-10-CM

## 2021-12-12 DIAGNOSIS — Z23 Encounter for immunization: Secondary | ICD-10-CM

## 2021-12-12 DIAGNOSIS — F325 Major depressive disorder, single episode, in full remission: Secondary | ICD-10-CM

## 2021-12-12 DIAGNOSIS — I70245 Atherosclerosis of native arteries of left leg with ulceration of other part of foot: Secondary | ICD-10-CM | POA: Diagnosis not present

## 2021-12-12 NOTE — Progress Notes (Unsigned)
Careteam: Patient Care Team: [name], NP as PCP - General (Geriatric Medicine) Dorothy Spark, MD as PCP - Cardiology (Cardiology) Edrick Kins, DPM as Consulting Physician (Podiatry) Dorothy Spark, MD as Consulting Physician (Cardiology) [name], NP as Nurse Practitioner ([place]) Camillo Flaming, OD as Referring Physician (Optometry)  PLACE OF SERVICE:  [facility]  Advanced Directive information    Allergies  Allergen Reactions   Penicillins Itching, Rash and Other (See Comments)    Tolerated amoxicillin, unasyn, zosyn & cephalosporins in the past. Did it involve swelling of the face/tongue/throat, SOB, or low BP? No Did it involve sudden or severe rash/hives, skin peeling, or any reaction on the inside of your mouth or nose? No Did you need to seek medical attention at a hospital or doctor's office? Unknown When did it last happen?      5+ years If all above answers are "NO", may proceed with cephalosporin use.     Chief Complaint  Patient presents with   Medical Management of Chronic Issues    3 month follow-up and foot exam (followed by foot specialist, will see this Friday). Discuss need for shingrix, covid booster, flu vaccine and A1c or post pone if patient refuses.      HPI: Patient is a 88 y.o. female for routine follow up.  No acute concerns today.  Overall doing well.    Rt foot ulcer with dressing, follows with podiatry. Due to see him on Friday. Remains on antibiotics. Has bilateral post-surgery shoes.  Reports a place on her bottom. Stage 1 on sacrum. Daughter reports if she will sleep on her egg create cushion It goes way.   Has chronic leakage from her G/J tube, reports leakage is unchanged or new complications.   Walks around in the house, has rollator.  Poor appetite. Receives most of her nutrition through G/J tube. Nocturnal feeds, 15 hrs/day. Drinks fluids throughout the day. Does not feel full.   Mood  is stable, remains on cymbalta.   Denies pain.   Sometimes will have a GERD flare but will take a TUMS, remains on protonix.   No constipation. No urinary symptoms.   Weight is stable.  Review of Systems:  Review of Systems  Constitutional:  Negative for chills, fever and weight loss.  HENT:  Negative for ear pain and hearing loss.   Eyes:  Negative for blurred vision.  Cardiovascular:  Negative for chest pain, palpitations and leg swelling.  Gastrointestinal:  Positive for heartburn. Negative for abdominal pain, constipation and diarrhea.  Genitourinary:  Negative for dysuria.  Musculoskeletal:  Positive for back pain. Negative for falls.  Skin:  Negative for itching and rash.  Neurological:  Negative for dizziness and headaches.  Psychiatric/Behavioral:  Negative for depression. The patient is not nervous/anxious.    Past Medical History:  Diagnosis Date   Acute respiratory failure ([place])    Anemia    previous blood transfusions   Arthritis    "all over"   Asthma    Atrial flutter (HCC)    Bradycardia    requiring previous d/c of BB and reduction of amiodarone   CAD (coronary artery disease)    nonobstructive per notes   Chronic diastolic CHF (congestive heart failure) ([place])    CKD (chronic kidney disease), stage III ([place])    Complication of blood transfusion    "got the wrong blood type at [facility] in ~ 2015; no adverse reaction that we are aware  of"/daughter, [name] (01/27/2016)   COPD (chronic obstructive pulmonary disease) ([place])    Depression    "light case"   DVT (deep venous thrombosis) ([place]) 01/2016   a. LLE DVT 01/2016 - switched from Eliquis to Coumadin.   Dyspnea    with some activity   Gastric stenosis    a. s/p stomach tube   GERD (gastroesophageal reflux disease)    GI bleed    Headache    History of 2019 novel coronavirus disease (COVID-19)    History of blood transfusion    "several" (01/27/2016)   History of stomach ulcers    Hyperlipidemia     Hypertension    Hypothyroidism    On home oxygen therapy    "2L; 9PM til 9AM" (06/27/2017)   PAD (peripheral artery disease) (HCC)    a. prior gangrene, toe amputations, intervention.   PAF (paroxysmal atrial fibrillation) ([place])    Paraesophageal hernia    Perforated gastric ulcer ([place])    Pneumonia    "a few times" (06/27/2017)   Seasonal allergies    SIADH (syndrome of inappropriate ADH production) ([place])    [name] 01/10/2015   Small bowel obstruction ([place])    "I don't know how many" (01/11/2015)   Stroke ([place])    several- no residual   Type II diabetes mellitus ([place])    "related to prednisone use  for > 20 yr; once predinose stopped; no more DM RX" (01/27/2016)   UTI (urinary tract infection) 02/08/2016   Ventral hernia with bowel obstruction    Past Surgical History:  Procedure Laterality Date   ABDOMINAL AORTOGRAM N/A 09/21/2016   Procedure: Abdominal Aortogram;  Surgeon: Waynetta Sandy, MD;  Location: [facility];  Service: Cardiovascular;  Laterality: N/A;   ABDOMINAL AORTOGRAM W/LOWER EXTREMITY N/A 10/20/2020   Procedure: ABDOMINAL AORTOGRAM W/LOWER EXTREMITY;  Surgeon: Cherre Robins, MD;  Location: [facility];  Service: Cardiovascular;  Laterality: N/A;   AMPUTATION Left 09/25/2016   Procedure: AMPUTATION DIGIT- LEFT 2ND AND 3RD TOES;  Surgeon: Rosetta Posner, MD;  Location: [facility];  Service: Vascular;  Laterality: Left;   AMPUTATION Right 05/03/2018   Procedure: AMPUTATION RIGHT GREAT TOE;  Surgeon: Rosetta Posner, MD;  [facility];  Service: Vascular;  Laterality: Right;   CATARACT EXTRACTION W/ INTRAOCULAR LENS  IMPLANT, BILATERAL     CHOLECYSTECTOMY OPEN     COLECTOMY     ESOPHAGOGASTRODUODENOSCOPY N/A 01/19/2014   Procedure: ESOPHAGOGASTRODUODENOSCOPY (EGD);  Surgeon: Irene Shipper, MD;  Location: [facility];  Service: Endoscopy;  Laterality: N/A;   ESOPHAGOGASTRODUODENOSCOPY N/A 01/20/2014   Procedure: ESOPHAGOGASTRODUODENOSCOPY  (EGD);  Surgeon: Irene Shipper, MD;  Location: [facility];  Service: Endoscopy;  Laterality: N/A;   ESOPHAGOGASTRODUODENOSCOPY N/A 03/19/2014   Procedure: ESOPHAGOGASTRODUODENOSCOPY (EGD);  Surgeon: Milus Banister, MD;  Location: [facility];  Service: Endoscopy;  Laterality: N/A;   ESOPHAGOGASTRODUODENOSCOPY N/A 07/08/2015   Procedure: ESOPHAGOGASTRODUODENOSCOPY (EGD);  Surgeon: Doran Stabler, MD;  Location: [facility];  Service: Endoscopy;  Laterality: N/A;   ESOPHAGOGASTRODUODENOSCOPY (EGD) WITH PROPOFOL N/A 09/15/2015   Procedure: ESOPHAGOGASTRODUODENOSCOPY (EGD) WITH PROPOFOL;  Surgeon: Manus Gunning, MD;  [facility];  Service: Gastroenterology;  Laterality: N/A;   ESOPHAGOGASTRODUODENOSCOPY (EGD) WITH PROPOFOL N/A 10/12/2020   Procedure: ESOPHAGOGASTRODUODENOSCOPY (EGD) WITH PROPOFOL;  Surgeon: Gatha Mayer, MD;  Location: [facility];  Service: Endoscopy;  Laterality: N/A;   FOOT SURGERY     GASTROJEJUNOSTOMY     hx/notes 01/10/2015  GASTROJEJUNOSTOMY N/A 09/23/2015   Procedure: OPEN GASTROJEJUNOSTOMY TUBE PLACEMENT;  Surgeon: Arta Bruce Kinsinger, MD;  [facility];  Service: General;  Laterality: N/A;   GLAUCOMA SURGERY Bilateral    HEMATOMA EVACUATION Right 10/20/2020   Procedure: EVACUATION HEMATOMA RIGHT GROIN;  Surgeon: Cherre Robins, MD;  Location: [facility];  Service: Vascular;  Laterality: Right;   IR CM INJ ANY COLONIC TUBE W/FLUORO  01/05/2017   IR CM INJ ANY COLONIC TUBE W/FLUORO  06/07/2017   IR CM INJ ANY COLONIC TUBE W/FLUORO  11/05/2017   IR CM INJ ANY COLONIC TUBE W/FLUORO  09/18/2018   IR CM INJ ANY COLONIC TUBE W/FLUORO  03/06/2019   IR CM INJ ANY COLONIC TUBE W/FLUORO  03/11/2019   IR GASTR TUBE CONVERT GASTR-JEJ PER W/FL MOD SED  04/30/2020   IR GENERIC HISTORICAL  01/07/2016   IR GJ TUBE CHANGE 01/07/2016 Jacqulynn Cadet, MD [facility]   IR GENERIC HISTORICAL  01/27/2016   IR MECH REMOV OBSTRUC MAT ANY COLON TUBE W/FLUORO  01/27/2016 Markus Daft, MD [facility]   IR GENERIC HISTORICAL  02/07/2016   IR PATIENT EVAL TECH 0-60 MINS Darrell K Allred, PA-C [facility]   IR GENERIC HISTORICAL  02/08/2016   IR [place] TUBE CHANGE 02/08/2016 Greggory Keen, MD [facility]   IR GENERIC HISTORICAL  01/06/2016   IR [place] TUBE CHANGE 01/06/2016 [facility] OUT REF   IR GENERIC HISTORICAL  05/02/2016   IR CM INJ ANY COLONIC TUBE W/FLUORO 05/02/2016 Arne Cleveland, MD [facility]   IR GENERIC HISTORICAL  05/15/2016   IR GJ TUBE CHANGE 05/15/2016 Sandi Mariscal, MD [facility]   IR GENERIC HISTORICAL  06/28/2016   IR GJ TUBE CHANGE 06/28/2016 [facility]   IR GJ TUBE CHANGE  02/20/2017   IR GJ TUBE CHANGE  05/10/2017   IR GJ TUBE CHANGE  07/06/2017   IR GJ TUBE CHANGE  08/02/2017   IR GJ TUBE CHANGE  10/15/2017   IR GJ TUBE CHANGE  12/26/2017   IR GJ TUBE CHANGE  04/08/2018   IR GJ TUBE CHANGE  06/19/2018   IR GJ TUBE CHANGE  03/03/2019   IR GJ TUBE CHANGE  06/06/2019   IR GJ TUBE CHANGE  09/26/2019   IR GJ TUBE CHANGE  01/09/2020   IR GJ TUBE CHANGE  05/12/2020   IR GJ TUBE CHANGE  10/05/2020   IR GJ TUBE CHANGE  10/14/2020   IR GJ TUBE CHANGE  10/19/2020   IR GJ TUBE CHANGE  02/25/2021   IR GJ TUBE CHANGE  03/23/2021   IR GJ TUBE CHANGE  08/29/2021   IR GJ TUBE CHANGE  08/30/2021   IR GJ TUBE CHANGE  09/05/2021   IR PATIENT EVAL TECH 0-60 MINS  10/19/2016   IR PATIENT EVAL TECH 0-60 MINS  12/25/2016   IR PATIENT EVAL TECH 0-60 MINS  01/29/2017   IR PATIENT EVAL TECH 0-60 MINS  04/04/2017   IR PATIENT EVAL TECH 0-60 MINS  04/30/2017   IR PATIENT EVAL TECH 0-60 MINS  07/31/2017   IR REPLC DUODEN/JEJUNO TUBE PERCUT W/FLUORO  11/14/2016   IR REPLC GASTRO/COLONIC TUBE PERCUT W/FLUORO  05/31/2021   IRRIGATION AND DEBRIDEMENT FOOT Bilateral 12/26/2020   Procedure: IRRIGATION AND DEBRIDEMENT FOOT AND BONE BIOPSY;  Surgeon: Trula Slade, DPM;  Location: [facility];  Service: Podiatry;  Laterality: Bilateral;   LAPAROTOMY N/A  01/20/2015   Procedure: EXPLORATORY LAPAROTOMY;  Surgeon: Coralie Keens, MD;  Location: [facility];  Service: General;  Laterality: N/A;   LOWER EXTREMITY ANGIOGRAPHY Left 09/21/2016   Procedure: Lower Extremity Angiography;  Surgeon: Waynetta Sandy, MD;  Location: [facility];  Service: Cardiovascular;  Laterality: Left;   LOWER EXTREMITY ANGIOGRAPHY Right 06/27/2017   Procedure: Lower Extremity Angiography;  Surgeon: Waynetta Sandy, MD;  Location: [facility];  Service: Cardiovascular;  Laterality: Right;   LOWER EXTREMITY ANGIOGRAPHY Right 12/24/2020   Procedure: LOWER EXTREMITY ANGIOGRAPHY;  Surgeon: Cherre Robins, MD;  Location: [facility];  Service: Cardiovascular;  Laterality: Right;   LYSIS OF ADHESION N/A 01/20/2015   Procedure: LYSIS OF ADHESIONS < 1 HOUR;  Surgeon: Coralie Keens, MD;  Location: [facility];  Service: General;  Laterality: N/A;   PERIPHERAL VASCULAR BALLOON ANGIOPLASTY Left 09/21/2016   Procedure: Peripheral Vascular Balloon Angioplasty;  Surgeon: Waynetta Sandy, MD;  Location: [facility];  Service: Cardiovascular;  Laterality: Left;  drug coated balloon   PERIPHERAL VASCULAR BALLOON ANGIOPLASTY Right 06/27/2017   Procedure: PERIPHERAL VASCULAR BALLOON ANGIOPLASTY;  Surgeon: Waynetta Sandy, MD;  Location: [facility];  Service: Cardiovascular;  Laterality: Right;  [id number]   PERIPHERAL VASCULAR INTERVENTION Left 10/20/2020   Procedure: PERIPHERAL VASCULAR INTERVENTION;  Surgeon: Cherre Robins, MD;  Location: [facility];  Service: Cardiovascular;  Laterality: Left;   TONSILLECTOMY     TRANSMETATARSAL AMPUTATION Right 11/14/2019   Procedure: TRANSMETATARSAL AMPUTATION RIGHT FOOT;  Surgeon: Edrick Kins, DPM;  Location: [facility];  Service: Podiatry;  Laterality: Right;   TUBAL LIGATION     VENTRAL HERNIA REPAIR  2015   incarcerated ventral hernia ([facility] 09/2013)/notes 01/10/2015    WOUND EXPLORATION Right 10/20/2020   Procedure: PRIMARY CLOSURE COMMON FEMORAL ARTERY;  Surgeon: Cherre Robins, MD;  Location: [facility];  Service: Vascular;  Laterality: Right;   Social History:   reports that she has never smoked. She has never been exposed to tobacco smoke. She quit smokeless tobacco use about 41 years ago.  Her smokeless tobacco use included snuff. She reports that she does not drink alcohol and does not use drugs.  Family History  Problem Relation Age of Onset   Stroke Mother    Hypertension Mother    Diabetes Brother    Glaucoma Son    Glaucoma Son    Heart attack Neg Hx    Colon cancer Neg Hx    Stomach cancer Neg Hx     Medications: Patient's Medications  New Prescriptions   No medications on file  Previous Medications   ACETAMINOPHEN (TYLENOL) 500 MG TABLET    Take 500 mg by mouth every 6 (six) hours as needed for moderate pain.   AMLODIPINE (NORVASC) 5 MG TABLET    Take 5 mg by mouth daily.   ASPIRIN LOW DOSE 81 MG TABLET    TAKE 1 TABLET (81 MG) BY MOUTH DAILY   ATORVASTATIN (LIPITOR) 10 MG TABLET    Take 10 mg by mouth daily.   B COMPLEX VITAMINS TABLET    Take 1 tablet by mouth daily.    CHOLECALCIFEROL (VITAMIN D3) 25 MCG (1000 UNIT) TABLET    Take 1,000 Units by mouth daily.   CYCLOSPORINE (RESTASIS) 0.05 % OPHTHALMIC EMULSION    Place 1 drop into both eyes 2 (two) times daily.   DOXYCYCLINE (VIBRA-TABS) 100 MG TABLET    Take 100 mg by mouth daily.   DULOXETINE (CYMBALTA) 30 MG CAPSULE    TAKE 1 CAPSULE  BY MOUTH EVERY DAY   FERROUS SULFATE 220 (44 FE) MG/5ML SOLUTION    TAKE 7.4 MLS (325 MG TOTAL) BY MOUTH DAILY.   FLUTICASONE (FLONASE) 50 MCG/ACT NASAL SPRAY    SPRAY 2 SPRAYS INTO EACH NOSTRIL EVERY DAY   FUROSEMIDE (LASIX) 40 MG TABLET    TAKE 0.5-1 TABLETS (20-40 MG TOTAL) BY MOUTH SEE ADMIN INSTRUCTIONS. TAKE 20 MG DAILY, MAY INSTEAD TAKE 40 MG DAILY AS NEEDED FOR EDEMA   GABAPENTIN (NEURONTIN) 300 MG CAPSULE    TAKE 1 CAPSULE BY MOUTH TWICE A DAY    GENTAMICIN CREAM (GARAMYCIN) 0.1 %    Apply 1 application topically daily as needed (dry skin).   HYDRALAZINE (APRESOLINE) 50 MG TABLET    Take 1 tablet (50 mg total) by mouth 3 (three) times daily.   LATANOPROST (XALATAN) 0.005 % OPHTHALMIC SOLUTION    Place 1 drop into both eyes at bedtime.   LEVOTHYROXINE (SYNTHROID) 125 MCG TABLET    Take 125 mcg by mouth daily before breakfast.   LORATADINE (CLARITIN) 10 MG TABLET    Take 10 mg by mouth daily.   MOMETASONE-FORMOTEROL (DULERA) 200-5 MCG/ACT AERO    Inhale 2 puffs into the lungs 2 (two) times daily.   MULTIPLE VITAMIN (MULTIVITAMIN WITH MINERALS) TABS TABLET    Take 1 tablet by mouth daily.   NUTRITIONAL SUPPLEMENTS (FEEDING SUPPLEMENT, OSMOLITE 1.5 CAL,) LIQD    Place 780 mLs into feeding tube See admin instructions. Take 780 ml over a 12 hour period per day 9pm-9am at a rate of 55   NYSTATIN CREAM (MYCOSTATIN)    Apply 1 application  topically 2 (two) times daily.   PANTOPRAZOLE (PROTONIX) 40 MG TABLET    Take 1 tablet (40 mg total) by mouth 2 (two) times daily.   POLYETHYL GLYCOL-PROPYL GLYCOL (SYSTANE) 0.4-0.3 % GEL OPHTHALMIC GEL    Place 1 application into both eyes 2 (two) times daily as needed (moisture).   POLYETHYLENE GLYCOL POWDER (GLYCOLAX/MIRALAX) 17 GM/SCOOP POWDER    Place 17 g into feeding tube at bedtime.   PROBIOTIC PRODUCT (EQL PROBIOTIC COLON SUPPORT PO)    Take 1 capsule by mouth daily.   SENNA-DOCUSATE (SENOKOT-S) 8.6-50 MG TABLET    Take 1 tablet by mouth at bedtime as needed for mild constipation.   SODIUM CHLORIDE-SODIUM BICARB (NETI POT SINUS WASH NA)    Place 1 application  into the nose 2 (two) times daily.   TRAMADOL (ULTRAM) 50 MG TABLET    Place 1 tablet (50 mg total) into feeding tube every 6 (six) hours as needed for severe pain.   WATER FOR IRRIGATION, STERILE (FREE WATER) SOLN    Place 180 mLs into feeding tube 3 (three) times daily.   WHITE PETROLATUM-MINERAL OIL (SYSTANE NIGHTTIME) OINT    Place 1  application into both eyes at bedtime.   Modified Medications   No medications on file  Discontinued Medications   DOXYCYCLINE (VIBRA-TABS) 100 MG TABLET    TAKE 1 TABLET BY MOUTH TWICE A DAY    Physical Exam:  Vitals:   12/12/21 1357  BP: 134/66  Pulse: 79  Temp: (!) 97.3 F (36.3 C)  SpO2: 99%  Weight: 57.6 kg  Height: '5\' 6"'  (1.676 m)   Body mass index is 20.5 kg/m. Wt Readings from Last 3 Encounters:  12/12/21 57.6 kg  09/12/21 56.3 kg  08/30/21 59.2 kg    Physical Exam Constitutional:      General: She is not in acute distress.  HENT:     Ears:     Comments: Bilateral hearing aides     Mouth/Throat:     Mouth: Mucous membranes are moist.     Pharynx: Oropharynx is clear.  Eyes:     Conjunctiva/sclera: Conjunctivae normal.  Cardiovascular:     Rate and Rhythm: Normal rate. Rhythm irregular.     Pulses: Normal pulses.     Heart sounds: Normal heart sounds. No murmur heard. Pulmonary:     Effort: Pulmonary effort is normal. No respiratory distress.     Breath sounds: Normal breath sounds. No wheezing.  Abdominal:     General: Bowel sounds are normal. There is no distension.     Palpations: Abdomen is soft.     Tenderness: There is no abdominal tenderness.     Comments: G/J tube in place  Musculoskeletal:     Right lower leg: No edema.     Left lower leg: No edema.     Comments: Rt transmetatarsal amputation 2nd toe amputation left foot  Skin:    General: Skin is warm and dry.     Comments: Stage 1 on sacrum Chronic ulcers bilateral feet  Neurological:     Mental Status: She is alert and oriented to person, place, and time. Mental status is at baseline.     Gait: Gait abnormal.     Comments: Ambulates with rollator  Psychiatric:        Mood and Affect: Mood normal.        Behavior: Behavior normal.     Labs reviewed: Basic Metabolic Panel: Recent Labs    12/27/20 0103 12/28/20 0417 12/28/20 1539 12/29/20 0203 12/30/20 0233 03/07/21 1120  05/04/21 1502 08/29/21 0717 08/30/21 0031 09/12/21 1358  NA 129* 130*   < > 131*   < > 139   < > 138 137 136  K 4.3 4.4   < > 4.3   < > 4.4   < > 3.7 3.4* 5.0  CL 100 103   < > 103   < > 101   < > 108 107 99  CO2 21* 22   < > 23   < > 29   < > '25 22 29  ' GLUCOSE 191* 142*   < > 107*   < > 46*   < > 104* 175* 109  BUN 39* 49*   < > 42*   < > 68*   < > 56* 45* 63*  CREATININE 1.21* 1.15*   < > 1.11*   < > 1.72*   < > 1.69* 1.55* 1.43*  CALCIUM 8.6* 8.9   < > 9.0   < > 9.4   < > 9.4 9.0 9.7  MG 1.7 1.8  --  2.0  --   --   --   --   --   --   PHOS 3.5 3.0  --  2.7  --   --   --   --   --   --   TSH  --   --   --   --   --  3.06  --   --   --   --    < > = values in this interval not displayed.   Liver Function Tests: Recent Labs    02/14/21 1447 03/07/21 1120 08/28/21 2150 08/30/21 0031 09/12/21 1358  AST 22   < > 42* 38 21  ALT 17   < > '19 20 16  ' ALKPHOS  76  --  77 70  --   BILITOT 0.3   < > 0.6 0.5 0.3  PROT 7.0   < > 7.1 6.4* 7.2  ALBUMIN 3.0*  --  3.2* 2.8*  --    < > = values in this interval not displayed.   Recent Labs    08/28/21 2150  LIPASE 21   No results for input(s): "AMMONIA" in the last 8760 hours. CBC: Recent Labs    08/28/21 2150 08/29/21 0717 08/30/21 0031 09/12/21 1358  WBC 4.7 4.4 3.3* 5.0  NEUTROABS 2.6  --  2.0 3,330  HGB 8.9* 8.9* 8.8* 9.3*  HCT 26.8* 27.1* 26.2* 28.9*  MCV 96.8 97.1 95.6 97.0  PLT 208 198 188 238   Lipid Panel: Recent Labs    03/07/21 1120  CHOL 106  HDL 51  LDLCALC 35  TRIG 113  CHOLHDL 2.1   TSH: Recent Labs    03/07/21 1120  TSH 3.06   A1C: Lab Results  Component Value Date   HGBA1C 6.3 (H) 06/08/2021     Assessment/Plan 1. Need for influenza vaccination - received in office today - Flu Vaccine QUAD High Dose(Fluad)  2. Type 2 diabetes mellitus with peripheral neuropathy (HCC) -off all medications.  Encouraged dietary compliance, routine foot care/monitoring and to keep up with diabetic eye  exams through ophthalmology  - Hemoglobin A1c  3. Acquired hypothyroidism - continue synthroid  - due for follow up TSH  4. Essential hypertension, benign - controlled -continue norvasc and hydralazine - CBC with Differential/Platelet - CMP with eGFR([facility])  5. Persistent atrial fibrillation (HCC) - rate controlled - no anticoagulation d/t hx of GI bleeds  6. Mixed hyperlipidemia - diet controlled  - CMP with eGFR([facility]) - Lipid panel  7. Chronic kidney disease, stage 4, severely decreased GFR (HCC) - avoid nephrotoxic agents such as NSAIDS  8. Pressure injury of sacral region, stage 1 - education on frequent repositioning, using pressure reduction and proper protein intake (has recently increased feedings.   9. Depression, major, single episode, complete remission ([place]) - stable - continue cymbalta   10. Diabetic polyneuropathy associated with type 2 diabetes mellitus ([place]) - stable  - continue gabapentin  11. PEG (percutaneous endoscopic gastrostomy) status ([place]) - chronic leakage but no erythema or pain at site - has G/J tube with nocturnal feedings  Return in about 4 months (around 04/13/2022) for routine follow up .  Student- [name], RN I personally was present during the history, physical exam and medical decision-making activities of this service and have verified that the service and findings are accurately documented in the student's note  Chanler Mendonca K. [place], [facility] [phone]

## 2021-12-13 ENCOUNTER — Ambulatory Visit: Payer: Medicare Other | Admitting: Nurse Practitioner

## 2021-12-13 ENCOUNTER — Other Ambulatory Visit: Payer: Self-pay | Admitting: *Deleted

## 2021-12-13 DIAGNOSIS — E039 Hypothyroidism, unspecified: Secondary | ICD-10-CM

## 2021-12-13 LAB — LIPID PANEL
Cholesterol: 96 mg/dL (ref <200)
HDL: 47 mg/dL — ABNORMAL LOW (ref >=50)
LDL Cholesterol (Calc): 28 mg/dL (calc)
Non-HDL Cholesterol (Calc): 49 mg/dL (calc) (ref <130)
Total CHOL/HDL Ratio: 2 (calc) (ref <5.0)
Triglycerides: 126 mg/dL (ref <150)

## 2021-12-13 LAB — COMPLETE METABOLIC PANEL WITH GFR
AG Ratio: 0.9 (calc) — ABNORMAL LOW (ref 1.0–2.5)
ALT: 16 U/L (ref 6–29)
AST: 21 U/L (ref 10–35)
Albumin: 3.5 g/dL — ABNORMAL LOW (ref 3.6–5.1)
Alkaline phosphatase (APISO): 87 U/L (ref 37–153)
BUN/Creatinine Ratio: 40 (calc) — ABNORMAL HIGH (ref 6–22)
BUN: 58 mg/dL — ABNORMAL HIGH (ref 7–25)
CO2: 25 mmol/L (ref 20–32)
Calcium: 9.3 mg/dL (ref 8.6–10.4)
Chloride: 104 mmol/L (ref 98–110)
Creat: 1.46 mg/dL — ABNORMAL HIGH (ref 0.60–0.95)
Globulin: 4 g/dL (calc) — ABNORMAL HIGH (ref 1.9–3.7)
Glucose, Bld: 101 mg/dL (ref 65–139)
Potassium: 4.9 mmol/L (ref 3.5–5.3)
Sodium: 138 mmol/L (ref 135–146)
Total Bilirubin: 0.3 mg/dL (ref 0.2–1.2)
Total Protein: 7.5 g/dL (ref 6.1–8.1)
eGFR: 33 mL/min/{1.73_m2} — ABNORMAL LOW (ref >=60)

## 2021-12-13 LAB — CBC WITH DIFFERENTIAL/PLATELET
Absolute Monocytes: 708 cells/uL (ref 200–950)
Basophils Absolute: 51 cells/uL (ref 0–200)
Basophils Relative: 1.1 %
Eosinophils Absolute: 179 cells/uL (ref 15–500)
Eosinophils Relative: 3.9 %
HCT: 28 % — ABNORMAL LOW (ref 35.0–45.0)
Hemoglobin: 9.2 g/dL — ABNORMAL LOW (ref 11.7–15.5)
Lymphs Abs: 690 cells/uL — ABNORMAL LOW (ref 850–3900)
MCH: 31.8 pg (ref 27.0–33.0)
MCHC: 32.9 g/dL (ref 32.0–36.0)
MCV: 96.9 fL (ref 80.0–100.0)
MPV: 12 fL (ref 7.5–12.5)
Monocytes Relative: 15.4 %
Neutro Abs: 2972 cells/uL (ref 1500–7800)
Neutrophils Relative %: 64.6 %
Platelets: 238 10*3/uL (ref 140–400)
RBC: 2.89 10*6/uL — ABNORMAL LOW (ref 3.80–5.10)
RDW: 14.3 % (ref 11.0–15.0)
Total Lymphocyte: 15 %
WBC: 4.6 10*3/uL (ref 3.8–10.8)

## 2021-12-13 LAB — TSH: TSH: 0.08 mIU/L — ABNORMAL LOW (ref 0.40–4.50)

## 2021-12-13 LAB — HEMOGLOBIN A1C
Hgb A1c MFr Bld: 6 % of total Hgb — ABNORMAL HIGH (ref <5.7)
Mean Plasma Glucose: 126 mg/dL
eAG (mmol/L): 7 mmol/L

## 2021-12-13 MED ORDER — LEVOTHYROXINE SODIUM 100 MCG PO TABS: 100.0000 ug | ORAL_TABLET | Freq: Every day | ORAL | 1 refills | Status: DC

## 2021-12-14 ENCOUNTER — Telehealth: Payer: Self-pay

## 2021-12-14 NOTE — Telephone Encounter (Signed)
Patient is using flonase twice a day. [name] states patient also says it seems to be getting thicker.

## 2021-12-14 NOTE — Telephone Encounter (Signed)
Is she using her flonase? Would recommend using it in the evening.

## 2021-12-14 NOTE — Telephone Encounter (Signed)
Patient's daughter called and left voicemail on clinical intake voicemail stating that patient forgot to mention at her appointment that she is having sinus drainage that seems to happen only when she lays down and was wanted to know what she can do about it.  Message routed to Sherrie Mustache, NP

## 2021-12-15 ENCOUNTER — Ambulatory Visit: Payer: Medicare Other | Attending: Nurse Practitioner | Admitting: Nurse Practitioner

## 2021-12-15 ENCOUNTER — Encounter: Payer: Self-pay | Admitting: Nurse Practitioner

## 2021-12-15 VITALS — BP 122/76 | HR 84 | Ht 66.0 in | Wt 126.4 lb

## 2021-12-15 DIAGNOSIS — Z79899 Other long term (current) drug therapy: Secondary | ICD-10-CM | POA: Diagnosis not present

## 2021-12-15 DIAGNOSIS — I5032 Chronic diastolic (congestive) heart failure: Secondary | ICD-10-CM

## 2021-12-15 DIAGNOSIS — I4819 Other persistent atrial fibrillation: Secondary | ICD-10-CM | POA: Diagnosis not present

## 2021-12-15 DIAGNOSIS — I10 Essential (primary) hypertension: Secondary | ICD-10-CM | POA: Diagnosis not present

## 2021-12-15 DIAGNOSIS — I739 Peripheral vascular disease, unspecified: Secondary | ICD-10-CM

## 2021-12-15 DIAGNOSIS — R0789 Other chest pain: Secondary | ICD-10-CM

## 2021-12-15 NOTE — Telephone Encounter (Signed)
Will try using nettipot then flonase after nettipot.

## 2021-12-15 NOTE — Telephone Encounter (Signed)
Would have her add in saline- to do a nasal rinse like a nettipot twice daily then use flonase

## 2021-12-15 NOTE — Patient Instructions (Signed)
Medication Instructions:   Try Pepcid one (1) tablet by mouth ( 20 mg) early pm to reduce secretions.   *If you need a refill on your cardiac medications before your next appointment, please call your pharmacy*   Lab Work:  None ordered.  If you have labs (blood work) drawn today and your tests are completely normal, you will receive your results only by: [place] (if you have MyChart) OR A paper copy in the mail If you have any lab test that is abnormal or we need to change your treatment, we will call you to review the results.   Testing/Procedures:  None ordered.   Follow-Up: At [facility], you and your health needs are our priority.  As part of our continuing mission to provide you with exceptional heart care, we have created designated Provider Care Teams.  These Care Teams include your primary Cardiologist (physician) and Advanced Practice Providers (APPs -  Physician Assistants and Nurse Practitioners) who all work together to provide you with the care you need, when you need it.  We recommend signing up for the patient portal called "MyChart".  Sign up information is provided on this After Visit Summary.  MyChart is used to connect with patients for Virtual Visits (Telemedicine).  Patients are able to view lab/test results, encounter notes, upcoming appointments, etc.  Non-urgent messages can be sent to your provider as well.   To learn more about what you can do with MyChart, go to [email or web address].    Your next appointment:   6 month(s)  The format for your next appointment:   In Person  Provider:   Dr. Gwyndolyn Kaufman    Important Information About Sugar

## 2021-12-16 ENCOUNTER — Ambulatory Visit (INDEPENDENT_AMBULATORY_CARE_PROVIDER_SITE_OTHER): Payer: Medicare Other | Admitting: Podiatry

## 2021-12-16 DIAGNOSIS — L97512 Non-pressure chronic ulcer of other part of right foot with fat layer exposed: Secondary | ICD-10-CM | POA: Diagnosis not present

## 2021-12-18 ENCOUNTER — Other Ambulatory Visit: Payer: Self-pay | Admitting: Podiatry

## 2021-12-18 NOTE — Progress Notes (Signed)
Chief Complaint  Patient presents with   Follow-up    Patient is here for follow-up for right foot ulcer.the patient has some drainage from the ulcer.   Subjective:  Patient presenting today for follow-up evaluation of chronic wounds to bilateral feet.  For now we are continuing palliative care.  Patient unfortunately missed her last appointment and so it has been 8 weeks since she has been seen.  She has noticed some increased pain and tenderness associated with the wounds.  Past Medical History:  Diagnosis Date   Acute respiratory failure ([place])    Anemia    previous blood transfusions   Arthritis    "all over"   Asthma    Atrial flutter ([facility])    Bradycardia    requiring previous d/c of BB and reduction of amiodarone   CAD (coronary artery disease)    nonobstructive per notes   Chronic diastolic CHF (congestive heart failure) ([place])    CKD (chronic kidney disease), stage III ([place])    Complication of blood transfusion    "got the wrong blood type at [facility] in ~ 2015; no adverse reaction that we are aware of"/daughter, [name] (01/27/2016)   COPD (chronic obstructive pulmonary disease) ([place])    Depression    "light case"   DVT (deep venous thrombosis) ([place]) 01/2016   a. LLE DVT 01/2016 - switched from Eliquis to Coumadin.   Dyspnea    with some activity   Gastric stenosis    a. s/p stomach tube   GERD (gastroesophageal reflux disease)    GI bleed    Headache    History of 2019 novel coronavirus disease (COVID-19)    History of blood transfusion    "several" (01/27/2016)   History of stomach ulcers    Hyperlipidemia    Hypertension    Hypothyroidism    On home oxygen therapy    "2L; 9PM til 9AM" (06/27/2017)   PAD (peripheral artery disease) ([facility])    a. prior gangrene, toe amputations, intervention.   PAF (paroxysmal atrial fibrillation) ([place])    Paraesophageal hernia    Perforated gastric ulcer ([place])    Pneumonia    "a few times" (06/27/2017)   Seasonal  allergies    SIADH (syndrome of inappropriate ADH production) ([place])    [name] 01/10/2015   Small bowel obstruction ([place])    "I don't know how many" (01/11/2015)   Stroke ([place])    several- no residual   Type II diabetes mellitus ([place])    "related to prednisone use  for > 20 yr; once predinose stopped; no more DM RX" (01/27/2016)   UTI (urinary tract infection) 02/08/2016   Ventral hernia with bowel obstruction         Objective: Physical Exam General: The patient is alert and oriented x3 in no acute distress.  Dermatology: There has been some increase in the wound size of the right plantar foot  Vascular: Established PAD.  Monitored closely by vein and vascular.  Balloon angioplasty with vein and vascular.  Lower extremity angiography 12/24/2020  VAS [place] ABI W/WO TBI 01/25/2021 Right: Resting right ankle-brachial index indicates mild right lower  extremity arterial disease.  Left: Resting left ankle-brachial index indicates noncompressible left  lower extremity arteries. The left toe-brachial index is abnormal.   Neurological: Epicritic and protective threshold diminished bilaterally.   Musculoskeletal Exam: Status post TMA right foot.  Hallux valgus left with history of overlying ulcer and degenerative changes.  Second and third toe amputation left.  Assessment: 1. s/p transmetatarsal amputation right foot. DOS: 11/14/2019 2.  H/o 2nd toe amputation left 3.  Chronic ulcers bilateral feet 4.  Chronic osteomyelitis bilateral feet  Plan of Care:  1. Patient was evaluated.   2.  Continue home nurse dressing changes 2 times weekly.  They have been applying Betadine and Aquacel to the wounds with dry dressings 3.  Continue palliative care for now and routine debridement  4.  Continue wearing postsurgical shoes bilateral  5.  Medically necessary excisional debridement of the wounds was performed today using a tissue nipper.  Excisional debridement of all necrotic nonviable tissue  down to healthier bleeding viable tissue was performed with postdebridement measurement same as pre- 6.  Patient is currently putting Aquacel Ag over the wound.  Continue. 7.  Patient is on chronic doxycycline 50 mg 2 times daily.  She is requesting to decrease the amount.  Okay to decrease to doxycycline 50 mg 1 time daily  8.  Return to clinic 6 weeks for conservative routine management  Edrick Kins, DPM [facility]  Dr. Edrick Kins, DPM    [address], [place] [address]                Office [phone]  Fax [phone]

## 2021-12-22 ENCOUNTER — Telehealth: Payer: Self-pay | Admitting: *Deleted

## 2021-12-22 NOTE — Telephone Encounter (Signed)
Patient's daughter is calling for a medication refill of the doxycycline, was last refill by another provider(12/12/21).Please advise.

## 2021-12-23 ENCOUNTER — Other Ambulatory Visit: Payer: Self-pay | Admitting: Podiatry

## 2021-12-23 MED ORDER — DOXYCYCLINE HYCLATE 100 MG PO TABS: 100.0000 mg | ORAL_TABLET | Freq: Every day | ORAL | 1 refills | Status: DC

## 2021-12-23 NOTE — Telephone Encounter (Signed)
Patient notified thru voice message,no answer.

## 2021-12-23 NOTE — Telephone Encounter (Signed)
Refill sent.  Thanks, Dr. Amalia Hailey

## 2021-12-30 ENCOUNTER — Other Ambulatory Visit: Payer: Self-pay | Admitting: Nurse Practitioner

## 2022-01-17 ENCOUNTER — Ambulatory Visit (INDEPENDENT_AMBULATORY_CARE_PROVIDER_SITE_OTHER): Payer: Medicare Other | Admitting: Adult Health

## 2022-01-17 ENCOUNTER — Encounter: Payer: Medicare Other | Admitting: Nurse Practitioner

## 2022-01-17 ENCOUNTER — Encounter: Payer: Self-pay | Admitting: Adult Health

## 2022-01-17 VITALS — BP 126/70 | HR 90 | Temp 95.6°F | Ht 66.0 in | Wt 129.6 lb

## 2022-01-17 DIAGNOSIS — K5900 Constipation, unspecified: Secondary | ICD-10-CM

## 2022-01-17 DIAGNOSIS — I70245 Atherosclerosis of native arteries of left leg with ulceration of other part of foot: Secondary | ICD-10-CM | POA: Diagnosis not present

## 2022-01-17 DIAGNOSIS — K649 Unspecified hemorrhoids: Secondary | ICD-10-CM

## 2022-01-17 DIAGNOSIS — R1319 Other dysphagia: Secondary | ICD-10-CM | POA: Diagnosis not present

## 2022-01-17 MED ORDER — HYDROCORTISONE (PERIANAL) 2.5 % EX CREA: 1.0000 | TOPICAL_CREAM | Freq: Two times a day (BID) | CUTANEOUS | 0 refills | Status: AC | PRN

## 2022-01-17 NOTE — Patient Instructions (Signed)
Hemorrhoids Hemorrhoids are swollen veins in and around the rectum or anus. There are two types of hemorrhoids: Internal hemorrhoids. These occur in the veins that are just inside the rectum. They may poke through to the outside and become irritated and painful. External hemorrhoids. These occur in the veins that are outside the anus and can be felt as a painful swelling or hard lump near the anus. Most hemorrhoids do not cause serious problems, and they can be managed with home treatments such as diet and lifestyle changes. If home treatments do not help the symptoms, procedures can be done to shrink or remove the hemorrhoids. What are the causes? This condition is caused by increased pressure in the anal area. This pressure may result from various things, including: Constipation. Straining to have a bowel movement. Diarrhea. Pregnancy. Obesity. Sitting for long periods of time. Heavy lifting or other activity that causes you to strain. Anal sex. Riding a bike for a long period of time. What are the signs or symptoms? Symptoms of this condition include: Pain. Anal itching or irritation. Rectal bleeding. Leakage of stool (feces). Anal swelling. One or more lumps around the anus. How is this diagnosed? This condition can often be diagnosed through a visual exam. Other exams or tests may also be done, such as: An exam that involves feeling the rectal area with a gloved hand (digital rectal exam). An exam of the anal canal that is done using a small tube (anoscope). A blood test, if you have lost a significant amount of blood. A test to look inside the colon using a flexible tube with a camera on the end (sigmoidoscopy or colonoscopy). How is this treated? This condition can usually be treated at home. However, various procedures may be done if dietary changes, lifestyle changes, and other home treatments do not help your symptoms. These procedures can help make the hemorrhoids smaller or  remove them completely. Some of these procedures involve surgery, and others do not. Common procedures include: Rubber band ligation. Rubber bands are placed at the base of the hemorrhoids to cut off their blood supply. Sclerotherapy. Medicine is injected into the hemorrhoids to shrink them. Infrared coagulation. A type of light energy is used to get rid of the hemorrhoids. Hemorrhoidectomy surgery. The hemorrhoids are surgically removed, and the veins that supply them are tied off. Stapled hemorrhoidopexy surgery. The surgeon staples the base of the hemorrhoid to the rectal wall. Follow these instructions at home: Eating and drinking  Eat foods that have a lot of fiber in them, such as whole grains, beans, nuts, fruits, and vegetables. Ask your health care provider about taking products that have added fiber (fiber supplements). Reduce the amount of fat in your diet. You can do this by eating low-fat dairy products, eating less red meat, and avoiding processed foods. Drink enough fluid to keep your urine pale yellow. Managing pain and swelling  Take warm sitz baths for 20 minutes, 3-4 times a day to ease pain and discomfort. You may do this in a bathtub or using a portable sitz bath that fits over the toilet. If directed, apply ice to the affected area. Using ice packs between sitz baths may be helpful. Put ice in a plastic bag. Place a towel between your skin and the bag. Leave the ice on for 20 minutes, 2-3 times a day. General instructions Take over-the-counter and prescription medicines only as told by your health care provider. Use medicated creams or suppositories as told. Get regular exercise.   Ask your health care provider how much and what kind of exercise is best for you. In general, you should do moderate exercise for at least 30 minutes on most days of the week (150 minutes each week). This can include activities such as walking, biking, or yoga. Go to the bathroom when you have  the urge to have a bowel movement. Do not wait. Avoid straining to have bowel movements. Keep the anal area dry and clean. Use wet toilet paper or moist towelettes after a bowel movement. Do not sit on the toilet for long periods of time. This increases blood pooling and pain. Keep all follow-up visits as told by your health care provider. This is important. Contact a health care provider if you have: Increasing pain and swelling that are not controlled by treatment or medicine. Difficulty having a bowel movement, or you are unable to have a bowel movement. Pain or inflammation outside the area of the hemorrhoids. Get help right away if you have: Uncontrolled bleeding from your rectum. Summary Hemorrhoids are swollen veins in and around the rectum or anus. Most hemorrhoids can be managed with home treatments such as diet and lifestyle changes. Taking warm sitz baths can help ease pain and discomfort. In severe cases, procedures or surgery can be done to shrink or remove the hemorrhoids. This information is not intended to replace advice given to you by your health care provider. Make sure you discuss any questions you have with your health care provider. Document Revised: 09/29/2020 Document Reviewed: 09/29/2020 [employer]vier Patient Education  2023 [employer]  

## 2022-01-17 NOTE — Progress Notes (Signed)
[facility] clinic  Provider:  Durenda Age DNP  Code Status:  DNR  Goals of Care:     09/12/2021    1:21 PM  Advanced Directives  Does Patient Have a Medical Advance Directive? Yes  Type of Paramedic of [place];Living will;Out of facility DNR (pink MOST or yellow form)  Does patient want to make changes to medical advance directive? No - Patient declined  Copy of [place] in Chart? Yes - validated most recent copy scanned in chart (See row information)     Chief Complaint  Patient presents with   Acute Visit    Patient presents today for rectal pain for a 1 week now. She have been putting preparation H    HPI: Patient is a 88 y.o. female seen today for an acute visit for rectal pain X 1 week. She was accompanied by her daughter. She has dysphagia and has a PEG tube. She can swallow ice cream through her mouth according to daughter. Daughter gives her food and medications via peg tube. Daughter gave her Miralax the other day but she starts having bowel movements after that. Patient is now complaining of rectal pain due to external hemorrhoids. She has  been applying Preparation H to the hemorrhoids.   Past Medical History:  Diagnosis Date   Acute respiratory failure ([place])    Anemia    previous blood transfusions   Arthritis    "all over"   Asthma    Atrial flutter (HCC)    Bradycardia    requiring previous d/c of BB and reduction of amiodarone   CAD (coronary artery disease)    nonobstructive per notes   Chronic diastolic CHF (congestive heart failure) ([place])    CKD (chronic kidney disease), stage III ([place])    Complication of blood transfusion    "got the wrong blood type at [facility] in ~ 2015; no adverse reaction that we are aware of"/daughter, [name] (01/27/2016)   COPD (chronic obstructive pulmonary disease) ([place])    Depression    "light case"   DVT (deep venous thrombosis) ([place]) 01/2016   a. LLE DVT 01/2016 - switched  from Eliquis to Coumadin.   Dyspnea    with some activity   Gastric stenosis    a. s/p stomach tube   GERD (gastroesophageal reflux disease)    GI bleed    Headache    History of 2019 novel coronavirus disease (COVID-19)    History of blood transfusion    "several" (01/27/2016)   History of stomach ulcers    Hyperlipidemia    Hypertension    Hypothyroidism    On home oxygen therapy    "2L; 9PM til 9AM" (06/27/2017)   PAD (peripheral artery disease) (HCC)    a. prior gangrene, toe amputations, intervention.   PAF (paroxysmal atrial fibrillation) ([place])    Paraesophageal hernia    Perforated gastric ulcer ([place])    Pneumonia    "a few times" (06/27/2017)   Seasonal allergies    SIADH (syndrome of inappropriate ADH production) ([place])    [name] 01/10/2015   Small bowel obstruction ([place])    "I don't know how many" (01/11/2015)   Stroke ([place])    several- no residual   Type II diabetes mellitus ([place])    "related to prednisone use  for > 20 yr; once predinose stopped; no more DM RX" (01/27/2016)   UTI (urinary tract infection) 02/08/2016   Ventral hernia with bowel obstruction  Past Surgical History:  Procedure Laterality Date   ABDOMINAL AORTOGRAM N/A 09/21/2016   Procedure: Abdominal Aortogram;  Surgeon: Waynetta Sandy, MD;  Location: [place] [facility];  Service: Cardiovascular;  Laterality: N/A;   ABDOMINAL AORTOGRAM W/LOWER EXTREMITY N/A 10/20/2020   Procedure: ABDOMINAL AORTOGRAM W/LOWER EXTREMITY;  Surgeon: Cherre Robins, MD;  Location: [place] [facility];  Service: Cardiovascular;  Laterality: N/A;   AMPUTATION Left 09/25/2016   Procedure: AMPUTATION DIGIT- LEFT 2ND AND 3RD TOES;  Surgeon: Rosetta Posner, MD;  Location: [place];  Service: Vascular;  Laterality: Left;   AMPUTATION Right 05/03/2018   Procedure: AMPUTATION RIGHT GREAT TOE;  Surgeon: Rosetta Posner, MD;  [facility]: [facility];  Service: Vascular;  Laterality: Right;   CATARACT EXTRACTION W/ INTRAOCULAR  LENS  IMPLANT, BILATERAL     CHOLECYSTECTOMY OPEN     COLECTOMY     ESOPHAGOGASTRODUODENOSCOPY N/A 01/19/2014   Procedure: ESOPHAGOGASTRODUODENOSCOPY (EGD);  Surgeon: Irene Shipper, MD;  Location[facility];  Service: Endoscopy;  Laterality: N/A;   ESOPHAGOGASTRODUODENOSCOPY N/A 01/20/2014   Procedure: ESOPHAGOGASTRODUODENOSCOPY (EGD);  Surgeon: Irene Shipper, MD;  Location: [facility];  Service: Endoscopy;  Laterality: N/A;   ESOPHAGOGASTRODUODENOSCOPY N/A 03/19/2014   Procedure: ESOPHAGOGASTRODUODENOSCOPY (EGD);  Surgeon: Milus Banister, MD;  Location: [facility];  Service: Endoscopy;  Laterality: N/A;   ESOPHAGOGASTRODUODENOSCOPY N/A 07/08/2015   Procedure: ESOPHAGOGASTRODUODENOSCOPY (EGD);  Surgeon: Doran Stabler, MD;  [facility];  Service: Endoscopy;  Laterality: N/A;   ESOPHAGOGASTRODUODENOSCOPY (EGD) WITH PROPOFOL N/A 09/15/2015   Procedure: ESOPHAGOGASTRODUODENOSCOPY (EGD) WITH PROPOFOL;  Surgeon: Manus Gunning, MD;  [facility]: [facility];  Service: Gastroenterology;  Laterality: N/A;   ESOPHAGOGASTRODUODENOSCOPY (EGD) WITH PROPOFOL N/A 10/12/2020   Procedure: ESOPHAGOGASTRODUODENOSCOPY (EGD) WITH PROPOFOL;  Surgeon: Gatha Mayer, MD;  Location: [place];  Service: Endoscopy;  Laterality: N/A;   FOOT SURGERY     GASTROJEJUNOSTOMY     hx/notes 01/10/2015   GASTROJEJUNOSTOMY N/A 09/23/2015   Procedure: OPEN GASTROJEJUNOSTOMY TUBE PLACEMENT;  Surgeon: Arta Bruce Kinsinger, MD;  Location: [facility];  Service: General;  Laterality: N/A;   GLAUCOMA SURGERY Bilateral    HEMATOMA EVACUATION Right 10/20/2020   Procedure: EVACUATION HEMATOMA RIGHT GROIN;  Surgeon: Cherre Robins, MD;  Location: [place];  Service: Vascular;  Laterality: Right;   IR CM INJ ANY COLONIC TUBE W/FLUORO  01/05/2017   IR CM INJ ANY COLONIC TUBE W/FLUORO  06/07/2017   IR CM INJ ANY COLONIC TUBE W/FLUORO  11/05/2017   IR CM INJ ANY COLONIC TUBE W/FLUORO  09/18/2018   IR CM INJ ANY  COLONIC TUBE W/FLUORO  03/06/2019   IR CM INJ ANY COLONIC TUBE W/FLUORO  03/11/2019   IR GASTR TUBE CONVERT GASTR-JEJ PER W/FL MOD SED  04/30/2020   IR GENERIC HISTORICAL  01/07/2016   IR GJ TUBE CHANGE 01/07/2016 Jacqulynn Cadet, MD [facility]   IR GENERIC HISTORICAL  01/27/2016   IR MECH REMOV OBSTRUC MAT ANY COLON TUBE W/FLUORO 01/27/2016 Markus Daft, MD [facility]   IR GENERIC HISTORICAL  02/07/2016   IR PATIENT EVAL TECH 0-60 MINS Darrell K Allred, PA-C [facility]   IR GENERIC HISTORICAL  02/08/2016   IR [place] TUBE CHANGE 02/08/2016 Greggory Keen, MD [facility]   IR GENERIC HISTORICAL  01/06/2016   IR [place] TUBE CHANGE 01/06/2016 [facility] OUT REF   IR GENERIC HISTORICAL  05/02/2016   IR CM INJ ANY COLONIC TUBE W/FLUORO 05/02/2016 Arne Cleveland, MD [facility]  IR GENERIC HISTORICAL  05/15/2016   IR GJ TUBE CHANGE 05/15/2016 Sandi Mariscal, MD [facility]   IR GENERIC HISTORICAL  06/28/2016   IR GJ TUBE CHANGE 06/28/2016 [facility]   IR GJ TUBE CHANGE  02/20/2017   IR GJ TUBE CHANGE  05/10/2017   IR GJ TUBE CHANGE  07/06/2017   IR GJ TUBE CHANGE  08/02/2017   IR GJ TUBE CHANGE  10/15/2017   IR GJ TUBE CHANGE  12/26/2017   IR GJ TUBE CHANGE  04/08/2018   IR GJ TUBE CHANGE  06/19/2018   IR GJ TUBE CHANGE  03/03/2019   IR GJ TUBE CHANGE  06/06/2019   IR GJ TUBE CHANGE  09/26/2019   IR GJ TUBE CHANGE  01/09/2020   IR GJ TUBE CHANGE  05/12/2020   IR GJ TUBE CHANGE  10/05/2020   IR GJ TUBE CHANGE  10/14/2020   IR GJ TUBE CHANGE  10/19/2020   IR GJ TUBE CHANGE  02/25/2021   IR GJ TUBE CHANGE  03/23/2021   IR GJ TUBE CHANGE  08/29/2021   IR GJ TUBE CHANGE  08/30/2021   IR GJ TUBE CHANGE  09/05/2021   IR PATIENT EVAL TECH 0-60 MINS  10/19/2016   IR PATIENT EVAL TECH 0-60 MINS  12/25/2016   IR PATIENT EVAL TECH 0-60 MINS  01/29/2017   IR PATIENT EVAL TECH 0-60 MINS  04/04/2017   IR PATIENT EVAL TECH 0-60 MINS  04/30/2017   IR PATIENT EVAL TECH 0-60 MINS  07/31/2017   IR REPLC  DUODEN/JEJUNO TUBE PERCUT W/FLUORO  11/14/2016   IR REPLC GASTRO/COLONIC TUBE PERCUT W/FLUORO  05/31/2021   IRRIGATION AND DEBRIDEMENT FOOT Bilateral 12/26/2020   Procedure: IRRIGATION AND DEBRIDEMENT FOOT AND BONE BIOPSY;  Surgeon: Trula Slade, DPM;  Location: [place];  Service: Podiatry;  Laterality: Bilateral;   LAPAROTOMY N/A 01/20/2015   Procedure: EXPLORATORY LAPAROTOMY;  Surgeon: Coralie Keens, MD;  Location: [place];  Service: General;  Laterality: N/A;   LOWER EXTREMITY ANGIOGRAPHY Left 09/21/2016   Procedure: Lower Extremity Angiography;  Surgeon: Waynetta Sandy, MD;  Location: [place] [facility];  Service: Cardiovascular;  Laterality: Left;   LOWER EXTREMITY ANGIOGRAPHY Right 06/27/2017   Procedure: Lower Extremity Angiography;  Surgeon: Waynetta Sandy, MD;  Location: [place] [facility];  Service: Cardiovascular;  Laterality: Right;   LOWER EXTREMITY ANGIOGRAPHY Right 12/24/2020   Procedure: LOWER EXTREMITY ANGIOGRAPHY;  Surgeon: Cherre Robins, MD;  Location: [place] [facility];  Service: Cardiovascular;  Laterality: Right;   LYSIS OF ADHESION N/A 01/20/2015   Procedure: LYSIS OF ADHESIONS < 1 HOUR;  Surgeon: Coralie Keens, MD;  Location: [place];  Service: General;  Laterality: N/A;   PERIPHERAL VASCULAR BALLOON ANGIOPLASTY Left 09/21/2016   Procedure: Peripheral Vascular Balloon Angioplasty;  Surgeon: Waynetta Sandy, MD;  Location: [place] [facility];  Service: Cardiovascular;  Laterality: Left;  drug coated balloon   PERIPHERAL VASCULAR BALLOON ANGIOPLASTY Right 06/27/2017   Procedure: PERIPHERAL VASCULAR BALLOON ANGIOPLASTY;  Surgeon: Waynetta Sandy, MD;  Location: [place] [facility];  Service: Cardiovascular;  Laterality: Right;  SFA/[id number]   PERIPHERAL VASCULAR INTERVENTION Left 10/20/2020   Procedure: PERIPHERAL VASCULAR INTERVENTION;  Surgeon: Cherre Robins, MD;  Location: [place] [facility];  Service: Cardiovascular;   Laterality: Left;   TONSILLECTOMY     TRANSMETATARSAL AMPUTATION Right 11/14/2019   Procedure: TRANSMETATARSAL AMPUTATION RIGHT FOOT;  Surgeon: Edrick Kins, DPM;  Location: [place];  Service: Podiatry;  Laterality: Right;   TUBAL LIGATION     VENTRAL HERNIA REPAIR  2015   incarcerated ventral hernia ([facility] 09/2013)/notes 01/10/2015   WOUND EXPLORATION Right 10/20/2020   Procedure: PRIMARY CLOSURE COMMON FEMORAL ARTERY;  Surgeon: Cherre Robins, MD;  Location: [place];  Service: Vascular;  Laterality: Right;    Allergies  Allergen Reactions   Penicillins Itching, Rash and Other (See Comments)    Tolerated amoxicillin, unasyn, zosyn & cephalosporins in the past. Did it involve swelling of the face/tongue/throat, SOB, or low BP? No Did it involve sudden or severe rash/hives, skin peeling, or any reaction on the inside of your mouth or nose? No Did you need to seek medical attention at a hospital or doctor's office? Unknown When did it last happen?      5+ years If all above answers are "NO", may proceed with cephalosporin use.     Outpatient Encounter Medications as of 01/17/2022  Medication Sig   acetaminophen (TYLENOL) 500 MG tablet Take 500 mg by mouth every 6 (six) hours as needed for moderate pain.   amLODipine (NORVASC) 5 MG tablet Take 5 mg by mouth daily.   ASPIRIN LOW DOSE 81 MG tablet TAKE 1 TABLET (81 MG) BY MOUTH DAILY   atorvastatin (LIPITOR) 10 MG tablet TAKE 1 TABLET BY MOUTH EVERY DAY   b complex vitamins tablet Take 1 tablet by mouth daily.    cholecalciferol (VITAMIN D3) 25 MCG (1000 UNIT) tablet Take 1,000 Units by mouth daily.   cycloSPORINE (RESTASIS) 0.05 % ophthalmic emulsion Place 1 drop into both eyes 2 (two) times daily.   doxycycline (VIBRA-TABS) 100 MG tablet Take 1 tablet (100 mg total) by mouth daily.   DULoxetine (CYMBALTA) 30 MG capsule TAKE 1 CAPSULE BY MOUTH EVERY DAY   ferrous sulfate 220 (44 Fe) MG/5ML solution TAKE 7.4 MLS (325 MG TOTAL) BY MOUTH  DAILY.   fluticasone (FLONASE) 50 MCG/ACT nasal spray SPRAY 2 SPRAYS INTO EACH NOSTRIL EVERY DAY   furosemide (LASIX) 40 MG tablet TAKE 0.5-1 TABLETS (20-40 MG TOTAL) BY MOUTH SEE ADMIN INSTRUCTIONS. TAKE 20 MG DAILY, MAY INSTEAD TAKE 40 MG DAILY AS NEEDED FOR EDEMA   gabapentin (NEURONTIN) 300 MG capsule TAKE 1 CAPSULE BY MOUTH TWICE A DAY   gentamicin cream (GARAMYCIN) 0.1 % Apply 1 application topically daily as needed (dry skin).   hydrALAZINE (APRESOLINE) 50 MG tablet Take 1 tablet (50 mg total) by mouth 3 (three) times daily.   latanoprost (XALATAN) 0.005 % ophthalmic solution Place 1 drop into both eyes at bedtime.   levothyroxine (SYNTHROID) 100 MCG tablet Take 1 tablet (100 mcg total) by mouth daily.   loratadine (CLARITIN) 10 MG tablet Take 10 mg by mouth daily.   mometasone-formoterol (DULERA) 200-5 MCG/ACT AERO Inhale 2 puffs into the lungs 2 (two) times daily.   Multiple Vitamin (MULTIVITAMIN WITH MINERALS) TABS tablet Take 1 tablet by mouth daily.   Nutritional Supplements (FEEDING SUPPLEMENT, OSMOLITE 1.5 CAL,) LIQD Place 780 mLs into feeding tube See admin instructions. Take 780 ml over a 12 hour period per day 9pm-9am at a rate of 55   nystatin cream (MYCOSTATIN) Apply 1 application  topically 2 (two) times daily.   pantoprazole (PROTONIX) 40 MG tablet Take 1 tablet (40 mg total) by mouth 2 (two) times daily.   Polyethyl Glycol-Propyl Glycol (SYSTANE) 0.4-0.3 % GEL ophthalmic gel Place 1 application into both eyes 2 (two) times daily as needed (moisture).   polyethylene glycol powder (GLYCOLAX/MIRALAX) 17 GM/SCOOP powder  Place 17 g into feeding tube at bedtime.   Probiotic Product (EQL PROBIOTIC COLON SUPPORT PO) Take 1 capsule by mouth daily.   senna-docusate (SENOKOT-S) 8.6-50 MG tablet Take 1 tablet by mouth at bedtime as needed for mild constipation.   Sodium Chloride-Sodium Bicarb (NETI POT SINUS WASH NA) Place 1 application  into the nose 2 (two) times daily.   traMADol  (ULTRAM) 50 MG tablet Place 1 tablet (50 mg total) into feeding tube every 6 (six) hours as needed for severe pain.   Water For Irrigation, Sterile (FREE WATER) SOLN Place 180 mLs into feeding tube 3 (three) times daily.   White Petrolatum-Mineral Oil (SYSTANE NIGHTTIME) OINT Place 1 application into both eyes at bedtime.    No facility-administered encounter medications on file as of 01/17/2022.    Review of Systems:  Review of Systems  Constitutional:  Negative for appetite change, chills, fatigue and fever.  HENT:  Negative for congestion, hearing loss, rhinorrhea and sore throat.   Eyes: Negative.   Respiratory:  Negative for cough, shortness of breath and wheezing.   Cardiovascular:  Negative for chest pain, palpitations and leg swelling.  Gastrointestinal:  Positive for constipation and rectal pain. Negative for abdominal pain, anal bleeding, diarrhea, nausea and vomiting.  Genitourinary:  Negative for dysuria.  Musculoskeletal:  Negative for arthralgias, back pain and myalgias.  Skin:  Negative for color change, rash and wound.  Neurological:  Negative for dizziness, weakness and headaches.  Psychiatric/Behavioral:  Negative for behavioral problems. The patient is not nervous/anxious.     Health Maintenance  Topic Date Due   Zoster Vaccines- Shingrix (2 of 2) 06/07/2011   COVID-19 Vaccine (3 - Janssen risk series) 08/19/2020   FOOT EXAM  10/19/2020   OPHTHALMOLOGY EXAM  05/30/2022   HEMOGLOBIN A1C  06/12/2022   TETANUS/TDAP  07/19/2026   Pneumonia Vaccine 75+ Years old  Completed   INFLUENZA VACCINE  Completed   DEXA SCAN  Completed   HPV VACCINES  Aged Out    Physical Exam: Vitals:   01/17/22 1508  BP: 126/70  Pulse: 90  Temp: (!) 95.6 F (35.3 C)  SpO2: 100%  Weight: 129 lb 9.6 oz (58.8 kg)  Height: '5\' 6"'$  (1.676 m)   Body mass index is 20.92 kg/m. Physical Exam Constitutional:      Appearance: Normal appearance.  HENT:     Head: Normocephalic and  atraumatic.     Nose: Nose normal.     Mouth/Throat:     Mouth: Mucous membranes are moist.  Eyes:     Conjunctiva/sclera: Conjunctivae normal.  Cardiovascular:     Rate and Rhythm: Normal rate and regular rhythm.  Pulmonary:     Effort: Pulmonary effort is normal.     Breath sounds: Normal breath sounds.  Abdominal:     General: Bowel sounds are normal.     Palpations: Abdomen is soft.     Comments: Has peg tube  Musculoskeletal:        General: Normal range of motion.     Cervical back: Normal range of motion.  Skin:    General: Skin is warm and dry.  Neurological:     General: No focal deficit present.     Mental Status: She is alert and oriented to person, place, and time.  Psychiatric:        Mood and Affect: Mood normal.        Behavior: Behavior normal.        Thought Content: Thought  content normal.        Judgment: Judgment normal.     Labs reviewed: Basic Metabolic Panel: Recent Labs    03/07/21 1120 05/04/21 1502 08/30/21 0031 09/12/21 1358 12/12/21 1452  NA 139   < > 137 136 138  K 4.4   < > 3.4* 5.0 4.9  CL 101   < > 107 99 104  CO2 29   < > '22 29 25  '$ GLUCOSE 46*   < > 175* 109 101  BUN 68*   < > 45* 63* 58*  CREATININE 1.72*   < > 1.55* 1.43* 1.46*  CALCIUM 9.4   < > 9.0 9.7 9.3  TSH 3.06  --   --   --  0.08*   < > = values in this interval not displayed.   Liver Function Tests: Recent Labs    02/14/21 1447 03/07/21 1120 08/28/21 2150 08/30/21 0031 09/12/21 1358 12/12/21 1452  AST 22   < > 42* 38 21 21  ALT 17   < > '19 20 16 16  '$ ALKPHOS 76  --  77 70  --   --   BILITOT 0.3   < > 0.6 0.5 0.3 0.3  PROT 7.0   < > 7.1 6.4* 7.2 7.5  ALBUMIN 3.0*  --  3.2* 2.8*  --   --    < > = values in this interval not displayed.   Recent Labs    08/28/21 2150  LIPASE 21   No results for input(s): "AMMONIA" in the last 8760 hours. CBC: Recent Labs    08/30/21 0031 09/12/21 1358 12/12/21 1452  WBC 3.3* 5.0 4.6  NEUTROABS 2.0 3,330 2,972  HGB  8.8* 9.3* 9.2*  HCT 26.2* 28.9* 28.0*  MCV 95.6 97.0 96.9  PLT 188 238 238   Lipid Panel: Recent Labs    03/07/21 1120 12/12/21 1452  CHOL 106 96  HDL 51 47*  LDLCALC 35 28  TRIG 113 126  CHOLHDL 2.1 2.0   Lab Results  Component Value Date   HGBA1C 6.0 (H) 12/12/2021    Procedures since last visit: No results found.  Assessment/Plan  1. Hemorrhoids, unspecified hemorrhoid type - hydrocortisone (ANUSOL-HC) 2.5 % rectal cream; Place 1 Application rectally 2 (two) times daily as needed for up to 14 days for hemorrhoids or anal itching.  Dispense: 30 g; Refill: 0  2. Constipation, unspecified constipation type -  continue Miralax on MWF  3. Other dysphagia -  continue tube feedings -  aspiration precaution   Labs/tests ordered:  None  Next appt:  01/19/2022

## 2022-01-19 ENCOUNTER — Ambulatory Visit (INDEPENDENT_AMBULATORY_CARE_PROVIDER_SITE_OTHER): Payer: Medicare Other | Admitting: Nurse Practitioner

## 2022-01-19 ENCOUNTER — Encounter: Payer: Self-pay | Admitting: Nurse Practitioner

## 2022-01-19 DIAGNOSIS — Z0000 Encounter for general adult medical examination without abnormal findings: Secondary | ICD-10-CM

## 2022-01-19 NOTE — Progress Notes (Signed)
   This service is provided via telemedicine  No vital signs collected/recorded due to the encounter was a telemedicine visit.   Location of patient (ex: home, work):  Home  Patient consents to a telephone visit: Yes, see telephone visit dated 01/19/22  Location of the provider (ex: office, home):  [facility], Office   Name of any referring provider:  N/A  Names of all persons participating in the telemedicine service and their role in the encounter:  S.Chrae B/CMA, Sherrie Mustache, NP, [name] (daughter) and Patient   Time spent on call:  18 min with medical assistant

## 2022-01-19 NOTE — Patient Instructions (Signed)
Shannon [name] , Thank you for taking time to come for your Medicare Wellness Visit. I appreciate your ongoing commitment to your health goals. Please review the following plan we discussed and let me know if I can assist you in the future.   Screening recommendations/referrals: Colonoscopy aged out Mammogram aged out Bone Density na Recommended yearly ophthalmology/optometry visit for glaucoma screening and checkup Recommended yearly dental visit for hygiene and checkup  Vaccinations: Influenza vaccine- due annually in September/October Pneumococcal vaccine up to date Tdap vaccine up to date Shingles vaccine declined    Advanced directives: on file.   Conditions/risks identified: advanced age, debility  Next appointment: yearly- next year in person.    Preventive Care 8 Years and Older, Female Preventive care refers to lifestyle choices and visits with your health care provider that can promote health and wellness. What does preventive care include? A yearly physical exam. This is also called an annual well check. Dental exams once or twice a year. Routine eye exams. Ask your health care provider how often you should have your eyes checked. Personal lifestyle choices, including: Daily care of your teeth and gums. Regular physical activity. Eating a healthy diet. Avoiding tobacco and drug use. Limiting alcohol use. Practicing safe sex. Taking low-dose aspirin every day. Taking vitamin and mineral supplements as recommended by your health care provider. What happens during an annual well check? The services and screenings done by your health care provider during your annual well check will depend on your age, overall health, lifestyle risk factors, and family history of disease. Counseling  Your health care provider may ask you questions about your: Alcohol use. Tobacco use. Drug use. Emotional well-being. Home and relationship well-being. Sexual activity. Eating  habits. History of falls. Memory and ability to understand (cognition). Work and work Statistician. Reproductive health. Screening  You may have the following tests or measurements: Height, weight, and BMI. Blood pressure. Lipid and cholesterol levels. These may be checked every 5 years, or more frequently if you are over 23 years old. Skin check. Lung cancer screening. You may have this screening every year starting at age 38 if you have a 30-pack-year history of smoking and currently smoke or have quit within the past 15 years. Fecal occult blood test (FOBT) of the stool. You may have this test every year starting at age 8. Flexible sigmoidoscopy or colonoscopy. You may have a sigmoidoscopy every 5 years or a colonoscopy every 10 years starting at age 49. Hepatitis C blood test. Hepatitis B blood test. Sexually transmitted disease (STD) testing. Diabetes screening. This is done by checking your blood sugar (glucose) after you have not eaten for a while (fasting). You may have this done every 1-3 years. Bone density scan. This is done to screen for osteoporosis. You may have this done starting at age 70. Mammogram. This may be done every 1-2 years. Talk to your health care provider about how often you should have regular mammograms. Talk with your health care provider about your test results, treatment options, and if necessary, the need for more tests. Vaccines  Your health care provider may recommend certain vaccines, such as: Influenza vaccine. This is recommended every year. Tetanus, diphtheria, and acellular pertussis (Tdap, Td) vaccine. You may need a Td booster every 10 years. Zoster vaccine. You may need this after age 24. Pneumococcal 13-valent conjugate (PCV13) vaccine. One dose is recommended after age 94. Pneumococcal polysaccharide (PPSV23) vaccine. One dose is recommended after age 10. Talk to your health care  provider about which screenings and vaccines you need and how  often you need them. This information is not intended to replace advice given to you by your health care provider. Make sure you discuss any questions you have with your health care provider. Document Released: 04/16/2015 Document Revised: 12/08/2015 Document Reviewed: 01/19/2015 Elsevier Interactive Patient Education  2017 [place] Prevention in the Home Falls can cause injuries. They can happen to people of all ages. There are many things you can do to make your home safe and to help prevent falls. What can I do on the outside of my home? Regularly fix the edges of walkways and driveways and fix any cracks. Remove anything that might make you trip as you walk through a door, such as a raised step or threshold. Trim any bushes or trees on the path to your home. Use bright outdoor lighting. Clear any walking paths of anything that might make someone trip, such as rocks or tools. Regularly check to see if handrails are loose or broken. Make sure that both sides of any steps have handrails. Any raised decks and porches should have guardrails on the edges. Have any leaves, snow, or ice cleared regularly. Use sand or salt on walking paths during winter. Clean up any spills in your garage right away. This includes oil or grease spills. What can I do in the bathroom? Use night lights. Install grab bars by the toilet and in the tub and shower. Do not use towel bars as grab bars. Use non-skid mats or decals in the tub or shower. If you need to sit down in the shower, use a plastic, non-slip stool. Keep the floor dry. Clean up any water that spills on the floor as soon as it happens. Remove soap buildup in the tub or shower regularly. Attach bath mats securely with double-sided non-slip rug tape. Do not have throw rugs and other things on the floor that can make you trip. What can I do in the bedroom? Use night lights. Make sure that you have a light by your bed that is easy to  reach. Do not use any sheets or blankets that are too big for your bed. They should not hang down onto the floor. Have a firm chair that has side arms. You can use this for support while you get dressed. Do not have throw rugs and other things on the floor that can make you trip. What can I do in the kitchen? Clean up any spills right away. Avoid walking on wet floors. Keep items that you use a lot in easy-to-reach places. If you need to reach something above you, use a strong step stool that has a grab bar. Keep electrical cords out of the way. Do not use floor polish or wax that makes floors slippery. If you must use wax, use non-skid floor wax. Do not have throw rugs and other things on the floor that can make you trip. What can I do with my stairs? Do not leave any items on the stairs. Make sure that there are handrails on both sides of the stairs and use them. Fix handrails that are broken or loose. Make sure that handrails are as long as the stairways. Check any carpeting to make sure that it is firmly attached to the stairs. Fix any carpet that is loose or worn. Avoid having throw rugs at the top or bottom of the stairs. If you do have throw rugs, attach them to the  floor with carpet tape. Make sure that you have a light switch at the top of the stairs and the bottom of the stairs. If you do not have them, ask someone to add them for you. What else can I do to help prevent falls? Wear shoes that: Do not have high heels. Have rubber bottoms. Are comfortable and fit you well. Are closed at the toe. Do not wear sandals. If you use a stepladder: Make sure that it is fully opened. Do not climb a closed stepladder. Make sure that both sides of the stepladder are locked into place. Ask someone to hold it for you, if possible. Clearly mark and make sure that you can see: Any grab bars or handrails. First and last steps. Where the edge of each step is. Use tools that help you move  around (mobility aids) if they are needed. These include: Canes. Walkers. Scooters. Crutches. Turn on the lights when you go into a dark area. Replace any light bulbs as soon as they burn out. Set up your furniture so you have a clear path. Avoid moving your furniture around. If any of your floors are uneven, fix them. If there are any pets around you, be aware of where they are. Review your medicines with your doctor. Some medicines can make you feel dizzy. This can increase your chance of falling. Ask your doctor what other things that you can do to help prevent falls. This information is not intended to replace advice given to you by your health care provider. Make sure you discuss any questions you have with your health care provider. Document Released: 01/14/2009 Document Revised: 08/26/2015 Document Reviewed: 04/24/2014 Elsevier Interactive Patient Education  2017 [employer].

## 2022-01-19 NOTE — Progress Notes (Signed)
Subjective:   Shannon [name] is a 88 y.o. female who presents for Medicare Annual (Subsequent) preventive examination.  Review of Systems     Cardiac Risk Factors include: sedentary lifestyle;advanced age (>27mn, >>20women);diabetes mellitus;hypertension     Objective:    There were no vitals filed for this visit. There is no height or weight on file to calculate BMI.     01/19/2022    9:35 AM 09/12/2021    1:21 PM 08/11/2021   12:24 PM 06/08/2021    1:26 PM 06/08/2021   11:45 AM 03/09/2021    1:59 PM 02/14/2021    4:15 PM  Advanced Directives  Does Patient Have a Medical Advance Directive? Yes Yes Yes Yes Yes Yes Yes  Type of AParamedicof [place] will;Out of facility DNR (pink MOST or yellow form) Healthcare Power of ANorth WantaghOut of facility DNR (pink MOST or yellow form)  Does patient want to make changes to medical advance directive? No - Patient declined No - Patient declined No - Patient declined No - Patient declined No - Patient declined No - Patient declined   Copy of [place] Chart? Yes - validated most recent copy scanned in chart (See row information) Yes - validated most recent copy scanned in chart (See row information)  Yes - validated most recent copy scanned in chart (See row information) Yes - validated most recent copy scanned in chart (See row information) Yes - validated most recent copy scanned in chart (See row information)     Current Medications (verified) Outpatient Encounter Medications as of 01/19/2022  Medication Sig   acetaminophen (TYLENOL) 500 MG tablet Take 500 mg by mouth every 6 (six) hours as needed for moderate pain.   amLODipine (NORVASC) 5 MG tablet Take 5 mg by mouth daily.   ASPIRIN LOW DOSE 81 MG tablet TAKE 1 TABLET (81 MG) BY MOUTH  DAILY   atorvastatin (LIPITOR) 10 MG tablet TAKE 1 TABLET BY MOUTH EVERY DAY   b complex vitamins tablet Take 1 tablet by mouth daily.    cholecalciferol (VITAMIN D3) 25 MCG (1000 UNIT) tablet Take 1,000 Units by mouth daily.   cycloSPORINE (RESTASIS) 0.05 % ophthalmic emulsion Place 1 drop into both eyes 2 (two) times daily.   doxycycline (VIBRA-TABS) 100 MG tablet Take 1 tablet (100 mg total) by mouth daily.   DULoxetine (CYMBALTA) 30 MG capsule TAKE 1 CAPSULE BY MOUTH EVERY DAY   ferrous sulfate 220 (44 Fe) MG/5ML solution TAKE 7.4 MLS (325 MG TOTAL) BY MOUTH DAILY.   fluticasone (FLONASE) 50 MCG/ACT nasal spray SPRAY 2 SPRAYS INTO EACH NOSTRIL EVERY DAY   furosemide (LASIX) 40 MG tablet TAKE 0.5-1 TABLETS (20-40 MG TOTAL) BY MOUTH SEE ADMIN INSTRUCTIONS. TAKE 20 MG DAILY, MAY INSTEAD TAKE 40 MG DAILY AS NEEDED FOR EDEMA   gabapentin (NEURONTIN) 300 MG capsule TAKE 1 CAPSULE BY MOUTH TWICE A DAY   gentamicin cream (GARAMYCIN) 0.1 % Apply 1 application topically daily as needed (dry skin).   hydrALAZINE (APRESOLINE) 50 MG tablet Take 1 tablet (50 mg total) by mouth 3 (three) times daily.   hydrocortisone (ANUSOL-HC) 2.5 % rectal cream Place 1 Application rectally 2 (two) times daily as needed for up to 14 days for hemorrhoids or anal itching.   latanoprost (XALATAN) 0.005 % ophthalmic solution Place 1 drop into both  eyes at bedtime.   levothyroxine (SYNTHROID) 100 MCG tablet Take 1 tablet (100 mcg total) by mouth daily.   loratadine (CLARITIN) 10 MG tablet Take 10 mg by mouth daily.   mometasone-formoterol (DULERA) 200-5 MCG/ACT AERO Inhale 2 puffs into the lungs 2 (two) times daily.   Multiple Vitamin (MULTIVITAMIN WITH MINERALS) TABS tablet Take 1 tablet by mouth daily.   Nutritional Supplements (FEEDING SUPPLEMENT, OSMOLITE 1.5 CAL,) LIQD Place 780 mLs into feeding tube See admin instructions. Take 780 ml over a 12 hour period per day 9pm-9am at a rate of 55   nystatin cream (MYCOSTATIN)  Apply 1 application  topically 2 (two) times daily.   pantoprazole (PROTONIX) 40 MG tablet Take 1 tablet (40 mg total) by mouth 2 (two) times daily.   Polyethyl Glycol-Propyl Glycol (SYSTANE) 0.4-0.3 % GEL ophthalmic gel Place 1 application into both eyes 2 (two) times daily as needed (moisture).   polyethylene glycol powder (GLYCOLAX/MIRALAX) 17 GM/SCOOP powder Place 17 g into feeding tube every Monday, Wednesday, and Friday at 8 PM.   Probiotic Product (EQL PROBIOTIC COLON SUPPORT PO) Take 1 capsule by mouth daily.   senna-docusate (SENOKOT-S) 8.6-50 MG tablet Take 1 tablet by mouth at bedtime as needed for mild constipation.   Sodium Chloride-Sodium Bicarb (NETI POT SINUS WASH NA) Place 1 application  into the nose 2 (two) times daily.   Water For Irrigation, Sterile (FREE WATER) SOLN Place 180 mLs into feeding tube 3 (three) times daily.   White Petrolatum-Mineral Oil (SYSTANE NIGHTTIME) OINT Place 1 application into both eyes at bedtime.    [DISCONTINUED] traMADol (ULTRAM) 50 MG tablet Place 1 tablet (50 mg total) into feeding tube every 6 (six) hours as needed for severe pain. (Patient not taking: Reported on 01/19/2022)   No facility-administered encounter medications on file as of 01/19/2022.    Allergies (verified) Penicillins   History: Past Medical History:  Diagnosis Date   Acute respiratory failure ([place])    Anemia    previous blood transfusions   Arthritis    "all over"   Asthma    Atrial flutter (HCC)    Bradycardia    requiring previous d/c of BB and reduction of amiodarone   CAD (coronary artery disease)    nonobstructive per notes   Chronic diastolic CHF (congestive heart failure) ([place])    CKD (chronic kidney disease), stage III ([place])    Complication of blood transfusion    "got the wrong blood type at [facility] in ~ 2015; no adverse reaction that we are aware of"/daughter, [name] (01/27/2016)   COPD (chronic obstructive pulmonary disease) ([place])    Depression     "light case"   DVT (deep venous thrombosis) ([place]) 01/2016   a. LLE DVT 01/2016 - switched from Eliquis to Coumadin.   Dyspnea    with some activity   Gastric stenosis    a. s/p stomach tube   GERD (gastroesophageal reflux disease)    GI bleed    Headache    History of 2019 novel coronavirus disease (COVID-19)    History of blood transfusion    "several" (01/27/2016)   History of stomach ulcers    Hyperlipidemia    Hypertension    Hypothyroidism    On home oxygen therapy    "2L; 9PM til 9AM" (06/27/2017)   PAD (peripheral artery disease) (HCC)    a. prior gangrene, toe amputations, intervention.   PAF (paroxysmal atrial fibrillation) (HCC)    Paraesophageal hernia    Perforated  gastric ulcer ([place])    Pneumonia    "a few times" (06/27/2017)   Seasonal allergies    SIADH (syndrome of inappropriate ADH production) ([place])    [name] 01/10/2015   Small bowel obstruction ([place])    "I don't know how many" (01/11/2015)   Stroke [facility])    several- no residual   Type II diabetes mellitus ([place])    "related to prednisone use  for > 20 yr; once predinose stopped; no more DM RX" (01/27/2016)   UTI (urinary tract infection) 02/08/2016   Ventral hernia with bowel obstruction    Past Surgical History:  Procedure Laterality Date   ABDOMINAL AORTOGRAM N/A 09/21/2016   Procedure: Abdominal Aortogram;  Surgeon: Waynetta Sandy, MD;  Location: [facility];  Service: Cardiovascular;  Laterality: N/A;   ABDOMINAL AORTOGRAM W/LOWER EXTREMITY N/A 10/20/2020   Procedure: ABDOMINAL AORTOGRAM W/LOWER EXTREMITY;  Surgeon: Cherre Robins, MD;  Location: [place] [facility];  Service: Cardiovascular;  Laterality: N/A;   AMPUTATION Left 09/25/2016   Procedure: AMPUTATION DIGIT- LEFT 2ND AND 3RD TOES;  Surgeon: Rosetta Posner, MD;  Location: [place];  Service: Vascular;  Laterality: Left;   AMPUTATION Right 05/03/2018   Procedure: AMPUTATION RIGHT GREAT TOE;  Surgeon: Rosetta Posner, MD;   [facility];  Service: Vascular;  Laterality: Right;   CATARACT EXTRACTION W/ INTRAOCULAR LENS  IMPLANT, BILATERAL     CHOLECYSTECTOMY OPEN     COLECTOMY     ESOPHAGOGASTRODUODENOSCOPY N/A 01/19/2014   Procedure: ESOPHAGOGASTRODUODENOSCOPY (EGD);  Surgeon: Irene Shipper, MD;  Location: [facility];  Service: Endoscopy;  Laterality: N/A;   ESOPHAGOGASTRODUODENOSCOPY N/A 01/20/2014   Procedure: ESOPHAGOGASTRODUODENOSCOPY (EGD);  Surgeon: Irene Shipper, MD;  Location: [facility];  Service: Endoscopy;  Laterality: N/A;   ESOPHAGOGASTRODUODENOSCOPY N/A 03/19/2014   Procedure: ESOPHAGOGASTRODUODENOSCOPY (EGD);  Surgeon: Milus Banister, MD;  Location: [facility];  Service: Endoscopy;  Laterality: N/A;   ESOPHAGOGASTRODUODENOSCOPY N/A 07/08/2015   Procedure: ESOPHAGOGASTRODUODENOSCOPY (EGD);  Surgeon: Doran Stabler, MD;  Location: [facility];  Service: Endoscopy;  Laterality: N/A;   ESOPHAGOGASTRODUODENOSCOPY (EGD) WITH PROPOFOL N/A 09/15/2015   Procedure: ESOPHAGOGASTRODUODENOSCOPY (EGD) WITH PROPOFOL;  Surgeon: Manus Gunning, MD;  [facility];  Service: Gastroenterology;  Laterality: N/A;   ESOPHAGOGASTRODUODENOSCOPY (EGD) WITH PROPOFOL N/A 10/12/2020   Procedure: ESOPHAGOGASTRODUODENOSCOPY (EGD) WITH PROPOFOL;  Surgeon: Gatha Mayer, MD;  Location: [place];  Service: Endoscopy;  Laterality: N/A;   FOOT SURGERY     GASTROJEJUNOSTOMY     hx/notes 01/10/2015   GASTROJEJUNOSTOMY N/A 09/23/2015   Procedure: OPEN GASTROJEJUNOSTOMY TUBE PLACEMENT;  Surgeon: Arta Bruce Kinsinger, MD;  [facility];  Service: General;  Laterality: N/A;   GLAUCOMA SURGERY Bilateral    HEMATOMA EVACUATION Right 10/20/2020   Procedure: EVACUATION HEMATOMA RIGHT GROIN;  Surgeon: Cherre Robins, MD;  Location: [place];  Service: Vascular;  Laterality: Right;   IR CM INJ ANY COLONIC TUBE W/FLUORO  01/05/2017   IR CM INJ ANY COLONIC TUBE W/FLUORO  06/07/2017   IR CM INJ ANY COLONIC  TUBE W/FLUORO  11/05/2017   IR CM INJ ANY COLONIC TUBE W/FLUORO  09/18/2018   IR CM INJ ANY COLONIC TUBE W/FLUORO  03/06/2019   IR CM INJ ANY COLONIC TUBE W/FLUORO  03/11/2019   IR GASTR TUBE CONVERT GASTR-JEJ PER W/FL MOD SED  04/30/2020   IR GENERIC HISTORICAL  01/07/2016   IR [place] TUBE CHANGE 01/07/2016 Jacqulynn Cadet, MD [facility]  IR GENERIC HISTORICAL  01/27/2016   IR MECH REMOV OBSTRUC MAT ANY COLON TUBE W/FLUORO 01/27/2016 Markus Daft, MD [facility]   IR GENERIC HISTORICAL  02/07/2016   IR PATIENT EVAL TECH 0-60 MINS Darrell K Allred, PA-C [facility]   IR GENERIC HISTORICAL  02/08/2016   IR GJ TUBE CHANGE 02/08/2016 Greggory Keen, MD [facility]   IR GENERIC HISTORICAL  01/06/2016   IR GJ TUBE CHANGE 01/06/2016 [facility] OUT REF   IR GENERIC HISTORICAL  05/02/2016   IR CM INJ ANY COLONIC TUBE W/FLUORO 05/02/2016 Arne Cleveland, MD [facility]   IR GENERIC HISTORICAL  05/15/2016   IR GJ TUBE CHANGE 05/15/2016 Sandi Mariscal, MD [facility]   IR GENERIC HISTORICAL  06/28/2016   IR GJ TUBE CHANGE 06/28/2016 [facility]   IR GJ TUBE CHANGE  02/20/2017   IR GJ TUBE CHANGE  05/10/2017   IR GJ TUBE CHANGE  07/06/2017   IR GJ TUBE CHANGE  08/02/2017   IR GJ TUBE CHANGE  10/15/2017   IR GJ TUBE CHANGE  12/26/2017   IR GJ TUBE CHANGE  04/08/2018   IR GJ TUBE CHANGE  06/19/2018   IR GJ TUBE CHANGE  03/03/2019   IR GJ TUBE CHANGE  06/06/2019   IR GJ TUBE CHANGE  09/26/2019   IR GJ TUBE CHANGE  01/09/2020   IR GJ TUBE CHANGE  05/12/2020   IR GJ TUBE CHANGE  10/05/2020   IR GJ TUBE CHANGE  10/14/2020   IR GJ TUBE CHANGE  10/19/2020   IR GJ TUBE CHANGE  02/25/2021   IR GJ TUBE CHANGE  03/23/2021   IR GJ TUBE CHANGE  08/29/2021   IR GJ TUBE CHANGE  08/30/2021   IR GJ TUBE CHANGE  09/05/2021   IR PATIENT EVAL TECH 0-60 MINS  10/19/2016   IR PATIENT EVAL TECH 0-60 MINS  12/25/2016   IR PATIENT EVAL TECH 0-60 MINS  01/29/2017   IR PATIENT EVAL TECH 0-60 MINS  04/04/2017   IR  PATIENT EVAL TECH 0-60 MINS  04/30/2017   IR PATIENT EVAL TECH 0-60 MINS  07/31/2017   IR REPLC DUODEN/JEJUNO TUBE PERCUT W/FLUORO  11/14/2016   IR REPLC GASTRO/COLONIC TUBE PERCUT W/FLUORO  05/31/2021   IRRIGATION AND DEBRIDEMENT FOOT Bilateral 12/26/2020   Procedure: IRRIGATION AND DEBRIDEMENT FOOT AND BONE BIOPSY;  Surgeon: Trula Slade, DPM;  Location: [facility];  Service: Podiatry;  Laterality: Bilateral;   LAPAROTOMY N/A 01/20/2015   Procedure: EXPLORATORY LAPAROTOMY;  Surgeon: Coralie Keens, MD;  Location: [facility];  Service: General;  Laterality: N/A;   LOWER EXTREMITY ANGIOGRAPHY Left 09/21/2016   Procedure: Lower Extremity Angiography;  Surgeon: Waynetta Sandy, MD;  Location: [facility];  Service: Cardiovascular;  Laterality: Left;   LOWER EXTREMITY ANGIOGRAPHY Right 06/27/2017   Procedure: Lower Extremity Angiography;  Surgeon: Waynetta Sandy, MD;  Location: [facility];  Service: Cardiovascular;  Laterality: Right;   LOWER EXTREMITY ANGIOGRAPHY Right 12/24/2020   Procedure: LOWER EXTREMITY ANGIOGRAPHY;  Surgeon: Cherre Robins, MD;  Location: [facility];  Service: Cardiovascular;  Laterality: Right;   LYSIS OF ADHESION N/A 01/20/2015   Procedure: LYSIS OF ADHESIONS < 1 HOUR;  Surgeon: Coralie Keens, MD;  Location: [place];  Service: General;  Laterality: N/A;   PERIPHERAL VASCULAR BALLOON ANGIOPLASTY Left 09/21/2016   Procedure: Peripheral Vascular Balloon Angioplasty;  Surgeon: Waynetta Sandy, MD;  Location: [facility];  Service: Cardiovascular;  Laterality:  Left;  drug coated balloon   PERIPHERAL VASCULAR BALLOON ANGIOPLASTY Right 06/27/2017   Procedure: PERIPHERAL VASCULAR BALLOON ANGIOPLASTY;  Surgeon: Waynetta Sandy, MD;  Location: [facility];  Service: Cardiovascular;  Laterality: Right;  SFA/[id number]   PERIPHERAL VASCULAR INTERVENTION Left 10/20/2020   Procedure: PERIPHERAL VASCULAR  INTERVENTION;  Surgeon: Cherre Robins, MD;  Location: [facility];  Service: Cardiovascular;  Laterality: Left;   TONSILLECTOMY     TRANSMETATARSAL AMPUTATION Right 11/14/2019   Procedure: TRANSMETATARSAL AMPUTATION RIGHT FOOT;  Surgeon: Edrick Kins, DPM;  Location: [facility];  Service: Podiatry;  Laterality: Right;   TUBAL LIGATION     VENTRAL HERNIA REPAIR  2015   incarcerated ventral hernia ([facility] 09/2013)/notes 01/10/2015   WOUND EXPLORATION Right 10/20/2020   Procedure: PRIMARY CLOSURE COMMON FEMORAL ARTERY;  Surgeon: Cherre Robins, MD;  Location: [facility];  Service: Vascular;  Laterality: Right;   Family History  Problem Relation Age of Onset   Stroke Mother    Hypertension Mother    Diabetes Brother    Glaucoma Son    Glaucoma Son    Heart attack Neg Hx    Colon cancer Neg Hx    Stomach cancer Neg Hx    Social History   Socioeconomic History   Marital status: Widowed    Spouse name: Not on file   Number of children: 3   Years of education: Not on file   Highest education level: Not on file  Occupational History   Not on file  Tobacco Use   Smoking status: Never    Passive exposure: Never   Smokeless tobacco: Former    Types: Snuff    Quit date: 04/03/1980   Tobacco comments:    "used snuff in my younger days"  Vaping Use   Vaping Use: Never used  Substance and Sexual Activity   Alcohol use: Never    Alcohol/week: 0.0 standard drinks of alcohol   Drug use: Never   Sexual activity: Not Currently  Other Topics Concern   Not on file  Social History Narrative   Married in 1947, lives in one story house with 2 people and no petsOccupation: ?Has a living Will, [place], and doesn't have DNRWas living with Husband (in frail health) in [place] until 09/2013.  After her [facility] admission they both came to live with daughter in [place] Strain: Low Risk  (10/05/2017)   Overall Financial Resource Strain  (CARDIA)    Difficulty of Paying Living Expenses: Not hard at all  Food Insecurity: No Food Insecurity (08/11/2021)   Hunger Vital Sign    Worried About Running Out of Food in the Last Year: Never true    Ran Out of Food in the Last Year: Never true  Transportation Needs: No Transportation Needs (08/11/2021)   PRAPARE - Hydrologist (Medical): No    Lack of Transportation (Non-Medical): No  Physical Activity: Inactive (10/05/2017)   Exercise Vital Sign    Days of Exercise per Week: 0 days    Minutes of Exercise per Session: 0 min  Stress: No Stress Concern Present (10/05/2017)   [place]    Feeling of Stress : Not at all  Social Connections: Moderately Isolated (10/05/2017)   Social Connection and Isolation Panel [NHANES]    Frequency of Communication with Friends and Family: More than three  times a week    Frequency of Social Gatherings with Friends and Family: More than three times a week    Attends Religious Services: Never    Marine scientist or Organizations: No    Attends Archivist Meetings: Never    Marital Status: Widowed    Tobacco Counseling Counseling given: Not Answered Tobacco comments: "used snuff in my younger days"   Clinical Intake:  Pre-visit preparation completed: Yes  Pain : No/denies pain     BMI - recorded: 20.92 Nutritional Status: BMI of 19-24  Normal Nutritional Risks: None Diabetes: Yes  How often do you need to have someone help you when you read instructions, pamphlets, or other written materials from your doctor or pharmacy?: 2 - Rarely  Diabetic?yes          Activities of Daily Living    01/19/2022   10:50 AM  In your present state of health, do you have any difficulty performing the following activities:  Hearing? 1  Vision? 0  Difficulty concentrating or making decisions? 1  Comment occasionally with trouble remembering  things  Walking or climbing stairs? 1  Comment does not climb stairs  Dressing or bathing? 0  Doing errands, shopping? 1  Preparing Food and eating ? N  Comment daughter helps  Using the Toilet? N  In the past six months, have you accidently leaked urine? Y  Do you have problems with loss of bowel control? Y  Managing your Medications? Y  Managing your Finances? Y  Housekeeping or managing your Housekeeping? Y    Patient Care Team: [name], NP as PCP - General (Geriatric Medicine) Dorothy Spark, MD as PCP - Cardiology (Cardiology) Edrick Kins, DPM as Consulting Physician (Podiatry) Dorothy Spark, MD as Consulting Physician (Cardiology) [name], NP as Nurse Practitioner ([place]) Camillo Flaming, [place] as Referring Physician (Optometry)  Indicate any recent Medical Services you may have received from other than [facility] providers in the past year (date may be approximate).     Assessment:   This is a routine wellness examination for [place].  Hearing/Vision screen Hearing Screening - Comments:: Wears hearing aids daily  Vision Screening - Comments:: Last eye exam less than 12 months ago. Dr.Koop  Dietary issues and exercise activities discussed: Current Exercise Habits: The patient does not participate in regular exercise at present   Goals Addressed   None    Depression Screen    01/19/2022    9:36 AM 08/11/2021   10:28 AM 01/13/2021    1:45 PM 10/21/2019   11:00 AM 07/21/2019    2:57 PM 10/10/2018    8:59 AM 08/07/2018    3:54 PM  PHQ 2/9 Scores  PHQ - 2 Score 0 0 0 0 0 0 0    Fall Risk    01/19/2022    9:35 AM 01/17/2022    3:10 PM 12/12/2021    2:00 PM 09/12/2021    1:21 PM 08/11/2021   10:29 AM  Fall Risk   Falls in the past year? 0 0 0 0   Number falls in past yr: 0 0 0 0 0  Injury with Fall? 0 0 0 0 0  Risk for fall due to : No Fall Risks No Fall Risks No Fall Risks No Fall Risks Other (Comment)  Follow up Falls  evaluation completed  Falls evaluation completed Falls evaluation completed Falls evaluation completed    Independence:  Any stairs in or around the home? Yes  If so, are there any without handrails? No  Home free of loose throw rugs in walkways, pet beds, electrical cords, etc? Yes  Adequate lighting in your home to reduce risk of falls? Yes   ASSISTIVE DEVICES UTILIZED TO PREVENT FALLS:  Life alert? No  Use of a cane, walker or w/c? Yes  Grab bars in the bathroom? No  Shower chair or bench in shower? Yes  Elevated toilet seat or a handicapped toilet? No   TIMED UP AND GO:  Was the test performed? No .   Cognitive Function:    10/05/2017   10:27 AM 08/30/2016    2:17 PM 08/29/2015    1:03 PM 11/06/2014   10:10 AM  MMSE - Mini Mental State Exam  Orientation to time '5 5 5 5  '$ Orientation to Place '5 4 5 5  '$ Registration '3 3 3 3  '$ Attention/ Calculation '3 4 5 3  '$ Recall 0 0 2 2  Language- name 2 objects '2 2 2 2  '$ Language- repeat '1 1 1 1  '$ Language- follow 3 step command '3 3 3 3  '$ Language- read & follow direction '1 1 1 1  '$ Write a sentence 1 0 0 0  Copy design 1 0 0 1  Total score '25 23 27 26        '$ 01/19/2022   10:32 AM 01/13/2021    1:46 PM 10/21/2019   11:03 AM 10/10/2018    9:00 AM  6CIT Screen  What Year? 0 points 0 points 0 points 0 points  What month? 0 points 0 points 0 points 0 points  What time? 0 points 3 points 0 points 0 points  Count back from 20 2 points 4 points 0 points 0 points  Months in reverse 4 points 4 points 2 points 0 points  Repeat phrase 10 points 6 points 6 points 6 points  Total Score 16 points 17 points 8 points 6 points    Immunizations Immunization History  Administered Date(s) Administered   Fluad Quad(high Dose 65+) 12/02/2018, 12/30/2019, 01/06/2021, 12/12/2021   Influenza, High Dose Seasonal PF 01/19/2017   Influenza,inj,Quad PF,6+ Mos 12/24/2013, 01/27/2015, 12/29/2015, 12/04/2017    Influenza-Unspecified 12/02/2012   Janssen (J&J) SARS-COV-2 Vaccination 07/11/2019, 06/24/2020   Pneumococcal Conjugate-13 02/03/2014   Pneumococcal Polysaccharide-23 08/31/2016   Tdap 07/18/2016   Zoster Recombinat (Shingrix) 04/12/2011    TDAP status: Up to date  Flu Vaccine status: Up to date  Pneumococcal vaccine status: Up to date  Covid-19 vaccine status: Information provided on how to obtain vaccines.   Qualifies for Shingles Vaccine? Yes   Zostavax completed No   Shingrix Completed?: No.    Education has been provided regarding the importance of this vaccine. Patient has been advised to call insurance company to determine out of pocket expense if they have not yet received this vaccine. Advised may also receive vaccine at local pharmacy or Health Dept. Verbalized acceptance and understanding.  Screening Tests Health Maintenance  Topic Date Due   COVID-19 Vaccine (3 - Janssen risk series) 08/19/2020   FOOT EXAM  10/19/2020   OPHTHALMOLOGY EXAM  05/30/2022   HEMOGLOBIN A1C  06/12/2022   TETANUS/TDAP  07/19/2026   Pneumonia Vaccine 7+ Years old  Completed   INFLUENZA VACCINE  Completed   DEXA SCAN  Completed   HPV VACCINES  Aged Out   Zoster Vaccines- Shingrix  Discontinued    Health Maintenance  Health Maintenance Due  Topic Date Due   COVID-19 Vaccine (3 - Janssen risk series) 08/19/2020   FOOT EXAM  10/19/2020    Colorectal cancer screening: No longer required.   Mammogram status: No longer required due to age.   Lung Cancer Screening: (Low Dose CT Chest recommended if Age 81-80 years, 30 pack-year currently smoking OR have quit w/in 15years.) does not qualify.   Lung Cancer Screening Referral: na  Additional Screening:  Hepatitis C Screening: does not qualify; Completed   Vision Screening: Recommended annual ophthalmology exams for early detection of glaucoma and other disorders of the eye. Is the patient up to date with their annual eye exam?  Yes   Who is the provider or what is the name of the office in which the patient attends annual eye exams? [facility] If pt is not established with a provider, would they like to be referred to a provider to establish care? No .   Dental Screening: Recommended annual dental exams for proper oral hygiene  Community Resource Referral / Chronic Care Management: CRR required this visit?  No   CCM required this visit?  No      Plan:     I have personally reviewed and noted the following in the patient's chart:   Medical and social history Use of alcohol, tobacco or illicit drugs  Current medications and supplements including opioid prescriptions. Patient is not currently taking opioid prescriptions. Functional ability and status Nutritional status Physical activity Advanced directives List of other physicians Hospitalizations, surgeries, and ER visits in previous 12 months Vitals Screenings to include cognitive, depression, and falls Referrals and appointments  In addition, I have reviewed and discussed with patient certain preventive protocols, quality metrics, and best practice recommendations. A written personalized care plan for preventive services as well as general preventive health recommendations were provided to patient.     [name], NP   01/19/2022    Virtual Visit via Telephone Note  I connected with patient 01/19/22 at 10:20 AM EDT by telephone and verified that I am speaking with the correct person using two identifiers.  Location: Patient: home Provider: [facility]   I discussed the limitations, risks, security and privacy concerns of performing an evaluation and management service by telephone and the availability of in person appointments. I also discussed with the patient that there may be a patient responsible charge related to this service. The patient expressed understanding and agreed to proceed.   I discussed the assessment and treatment plan with the  patient. The patient was provided an opportunity to ask questions and all were answered. The patient agreed with the plan and demonstrated an understanding of the instructions.   The patient was advised to call back or seek an in-person evaluation if the symptoms worsen or if the condition fails to improve as anticipated.  I provided 15 minutes of non-face-to-face time during this encounter.  [name]. Harle Battiest Avs printed and mailed

## 2022-01-26 ENCOUNTER — Other Ambulatory Visit: Payer: Self-pay

## 2022-01-26 ENCOUNTER — Other Ambulatory Visit (HOSPITAL_COMMUNITY): Payer: Self-pay | Admitting: Interventional Radiology

## 2022-01-26 ENCOUNTER — Ambulatory Visit (HOSPITAL_COMMUNITY)
Admission: RE | Admit: 2022-01-26 | Discharge: 2022-01-26 | Disposition: A | Discharge status: Home / Self Care | Payer: Medicare Other | Source: Ambulatory Visit | Attending: Interventional Radiology | Admitting: Interventional Radiology

## 2022-01-26 DIAGNOSIS — R633 Feeding difficulties, unspecified: Secondary | ICD-10-CM | POA: Diagnosis not present

## 2022-01-26 DIAGNOSIS — Z4659 Encounter for fitting and adjustment of other gastrointestinal appliance and device: Secondary | ICD-10-CM

## 2022-01-26 DIAGNOSIS — Z431 Encounter for attention to gastrostomy: Secondary | ICD-10-CM | POA: Diagnosis not present

## 2022-01-26 DIAGNOSIS — K9413 Enterostomy malfunction: Secondary | ICD-10-CM | POA: Diagnosis not present

## 2022-01-26 DIAGNOSIS — E039 Hypothyroidism, unspecified: Secondary | ICD-10-CM

## 2022-01-26 HISTORY — PX: IR GJ TUBE CHANGE: IMG1440

## 2022-01-26 MED ORDER — IOHEXOL 300 MG/ML  SOLN: 50.0000 mL | Freq: Once | INTRAMUSCULAR | Status: DC | PRN

## 2022-01-26 MED ORDER — LIDOCAINE VISCOUS HCL 2 % MT SOLN
OROMUCOSAL | Status: AC
  Filled 2022-01-26: qty 15

## 2022-01-27 ENCOUNTER — Ambulatory Visit (INDEPENDENT_AMBULATORY_CARE_PROVIDER_SITE_OTHER): Payer: Medicare Other | Admitting: Podiatry

## 2022-01-27 ENCOUNTER — Encounter: Payer: Self-pay | Admitting: Podiatry

## 2022-01-27 DIAGNOSIS — L97512 Non-pressure chronic ulcer of other part of right foot with fat layer exposed: Secondary | ICD-10-CM

## 2022-01-27 DIAGNOSIS — E0859 Diabetes mellitus due to underlying condition with other circulatory complications: Secondary | ICD-10-CM | POA: Diagnosis not present

## 2022-01-27 NOTE — Progress Notes (Signed)
Chief Complaint  Patient presents with   Wound Check    "It still feels sore."   Subjective:  Patient presenting today for follow-up evaluation with her daughter of chronic wounds to bilateral feet.  For now we are continuing palliative care.  Patient states that the wounds are stable.  She continues to have some tenderness but overall doing well.  Presenting for follow-up treatment and evaluation  Past Medical History:  Diagnosis Date   Acute respiratory failure ([place])    Anemia    previous blood transfusions   Arthritis    "all over"   Asthma    Atrial flutter (HCC)    Bradycardia    requiring previous d/c of BB and reduction of amiodarone   CAD (coronary artery disease)    nonobstructive per notes   Chronic diastolic CHF (congestive heart failure) ([place])    CKD (chronic kidney disease), stage III ([place])    Complication of blood transfusion    "got the wrong blood type at [facility] in ~ 2015; no adverse reaction that we are aware of"/daughter, [name] (01/27/2016)   COPD (chronic obstructive pulmonary disease) ([place])    Depression    "light case"   DVT (deep venous thrombosis) ([place]) 01/2016   a. LLE DVT 01/2016 - switched from Eliquis to Coumadin.   Dyspnea    with some activity   Gastric stenosis    a. s/p stomach tube   GERD (gastroesophageal reflux disease)    GI bleed    Headache    History of 2019 novel coronavirus disease (COVID-19)    History of blood transfusion    "several" (01/27/2016)   History of stomach ulcers    Hyperlipidemia    Hypertension    Hypothyroidism    On home oxygen therapy    "2L; 9PM til 9AM" (06/27/2017)   PAD (peripheral artery disease) (H[facility])    a. prior gangrene, toe amputations, intervention.   PAF (paroxysmal atrial fibrillation) ([place])    Paraesophageal hernia    Perforated gastric ulcer ([place])    Pneumonia    "a few times" (06/27/2017)   Seasonal allergies    SIADH (syndrome of inappropriate ADH production) ([place])    [name]  01/10/2015   Small bowel obstruction ([place])    "I don't know how many" (01/11/2015)   Stroke ([place])    several- no residual   Type II diabetes mellitus ([place])    "related to prednisone use  for > 20 yr; once predinose stopped; no more DM RX" (01/27/2016)   UTI (urinary tract infection) 02/08/2016   Ventral hernia with bowel obstruction     LT foot 12/16/2021  RT foot 12/16/2021  Objective: Physical Exam General: The patient is alert and oriented x3 in no acute distress.  Dermatology: There has been some increase in the wound size of the right plantar foot  Vascular: Established PAD.  Monitored closely by vein and vascular.  Balloon angioplasty with vein and vascular.  Lower extremity angiography 12/24/2020  VAS [place] ABI W/WO TBI 01/25/2021 Right: Resting right ankle-brachial index indicates mild right lower  extremity arterial disease.  Left: Resting left ankle-brachial index indicates noncompressible left  lower extremity arteries. The left toe-brachial index is abnormal.   Neurological: Epicritic and protective threshold diminished bilaterally.   Musculoskeletal Exam: Status post TMA right foot.  Hallux valgus left with history of overlying ulcer and degenerative changes.  Second and third toe amputation left.  Assessment: 1. s/p transmetatarsal amputation right foot. DOS: 11/14/2019  2.  H/o 2nd toe amputation left 3.  Chronic ulcers bilateral feet 4.  Chronic osteomyelitis bilateral feet  Plan of Care:  1. Patient was evaluated.   2.  Continue home nurse dressing changes 2 times weekly.  They have been applying Betadine and Aquacel to the wounds with dry dressings 3.  Continue palliative care for now and routine debridement  4.  Continue wearing postsurgical shoes bilateral  5.  Medically necessary excisional debridement of the wounds was performed today using a tissue nipper.  Excisional debridement of all necrotic nonviable tissue down to healthier bleeding viable tissue was  performed with postdebridement measurement same as pre- 6.  Patient is currently putting Aquacel Ag over the wound.  Continue. 7.  Patient is on chronic doxycycline 50 mg daily.   8.  Return to clinic 6 weeks for conservative palliative care  Edrick Kins, DPM [facility]  Dr. Edrick Kins, DPM    [address], [place] [address]                Office [phone]  Fax [phone]

## 2022-01-31 ENCOUNTER — Telehealth: Payer: Self-pay

## 2022-01-31 NOTE — Telephone Encounter (Signed)
Spoke with patients daughter again and she said the there is no constipation and that it was the opposite having a lot of diarrhea strained att first with having the diarrhea but not anymore just very sore.

## 2022-01-31 NOTE — Telephone Encounter (Signed)
Patients daughter called stating that the medication and the baths are not helping with her mothers rectal pain and if there is something else that they can do because the area is now externally bleeding from the pain/ rash. Sent to pcp

## 2022-01-31 NOTE — Telephone Encounter (Signed)
She can continue the hydrocortisone cream, can add zinc oxide paste as needed also can use sitz bath.

## 2022-01-31 NOTE — Telephone Encounter (Signed)
Is she having constipation? I added Monina since she saw the patient for this.

## 2022-02-01 ENCOUNTER — Other Ambulatory Visit: Payer: Medicare Other

## 2022-02-02 ENCOUNTER — Other Ambulatory Visit: Payer: Medicare Other

## 2022-02-02 ENCOUNTER — Other Ambulatory Visit: Payer: Self-pay | Admitting: Adult Health

## 2022-02-02 DIAGNOSIS — K649 Unspecified hemorrhoids: Secondary | ICD-10-CM

## 2022-02-02 MED ORDER — LIDOCAINE-HYDROCORTISONE ACE 3-0.5 % EX CREA: 1.0000 | TOPICAL_CREAM | Freq: Two times a day (BID) | CUTANEOUS | 0 refills | Status: AC | PRN

## 2022-02-03 ENCOUNTER — Other Ambulatory Visit: Payer: Medicare Other

## 2022-02-03 DIAGNOSIS — E039 Hypothyroidism, unspecified: Secondary | ICD-10-CM | POA: Diagnosis not present

## 2022-02-04 LAB — TSH: TSH: 0.57 mIU/L (ref 0.40–4.50)

## 2022-02-10 ENCOUNTER — Other Ambulatory Visit: Payer: Self-pay | Admitting: Nurse Practitioner

## 2022-02-10 MED ORDER — MOMETASONE FURO-FORMOTEROL FUM 100-5 MCG/ACT IN AERO: 2.0000 | INHALATION_SPRAY | Freq: Two times a day (BID) | RESPIRATORY_TRACT | 2 refills | Status: DC

## 2022-02-10 NOTE — Telephone Encounter (Signed)
Please clarify the dose the patient is taking with her daughter

## 2022-02-10 NOTE — Telephone Encounter (Signed)
LMOM to return call.

## 2022-02-10 NOTE — Telephone Encounter (Signed)
Called and spoke with Shannon [name] and she stated that patient is currently taking Dulera 100-28mg 2 puffs twice daily.   But was confused because she has both dosages on hand. Patient is coughing more and wonders if she needs to start giving her the [facility]200-5 instead.   Please Advise.

## 2022-02-10 NOTE — Telephone Encounter (Signed)
Pharmacy requested refill

## 2022-02-11 ENCOUNTER — Other Ambulatory Visit: Payer: Self-pay | Admitting: Podiatry

## 2022-02-13 ENCOUNTER — Other Ambulatory Visit: Payer: Self-pay

## 2022-02-13 MED ORDER — MOMETASONE FURO-FORMOTEROL FUM 200-5 MCG/ACT IN AERO: 2.0000 | INHALATION_SPRAY | Freq: Two times a day (BID) | RESPIRATORY_TRACT | 1 refills | Status: DC

## 2022-02-13 NOTE — Telephone Encounter (Signed)
This encounter was created in error - please disregard.

## 2022-02-15 NOTE — Telephone Encounter (Signed)
Please advise 

## 2022-02-24 IMAGING — CT CT ABD-PELV W/ CM
2 of 5 series · 15 of 46 positions shown, 17 images · IV contrast (omnipaque)
Comparison: 07/07/2019

CLINICAL DATA: Constipation and hernia

EXAM:
CT ABDOMEN AND PELVIS WITH CONTRAST
TECHNIQUE: Multidetector CT imaging of the abdomen and pelvis was performed
using the standard protocol following bolus administration of
intravenous contrast.
CONTRAST:  75mL OMNIPAQUE IOHEXOL 300 MG/ML  SOLN

[Series 5: coronal st · coronal · 0.60mm/px · 3 of 117 slices shown]
[im 39/117  soft-tissue]
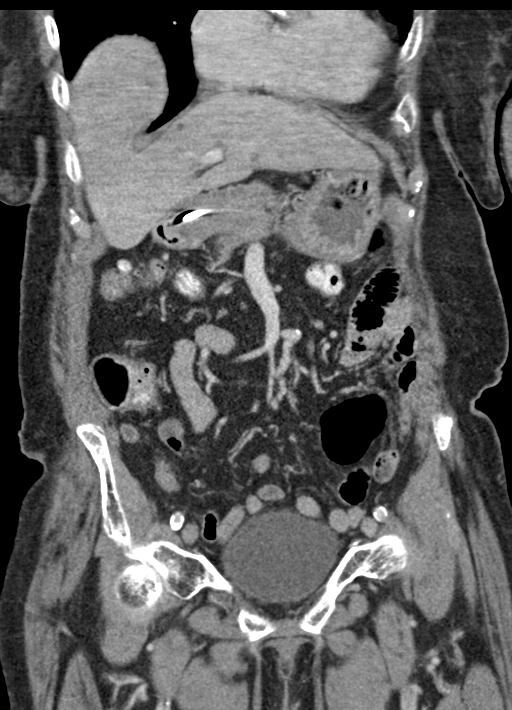
[im 52/117  soft-tissue]
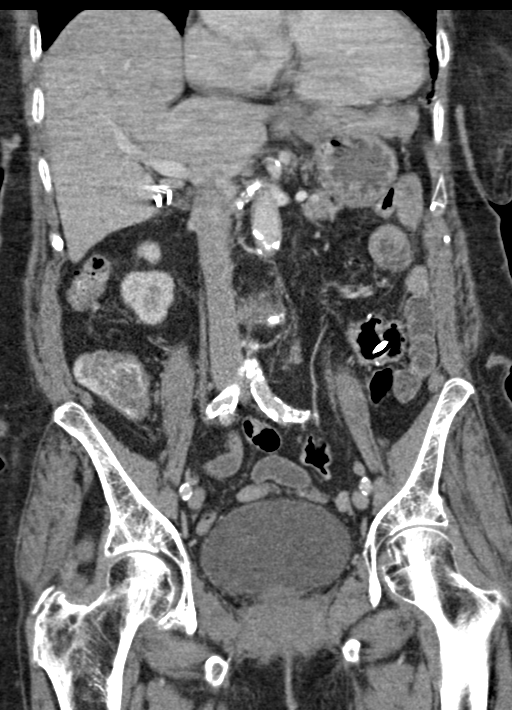
[im 65/117  soft-tissue]
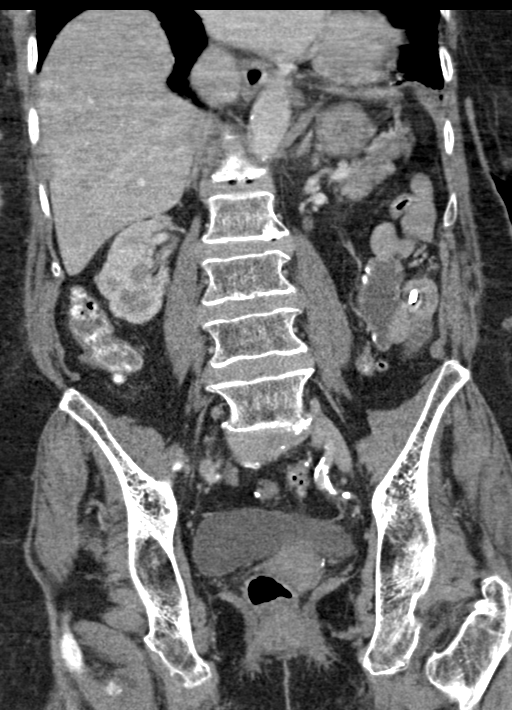

[Series 7: axial st · axial · 0.62mm/px · z∈[-729,-379]mm · 12 of 84 slices shown, 14 images]
[im 7/84  soft-tissue]
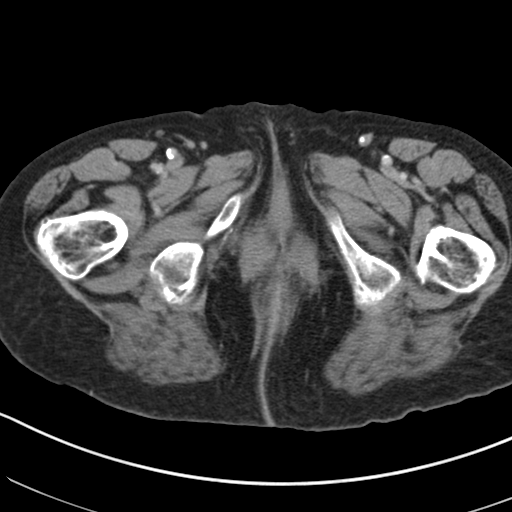
[im 7/84  bone]
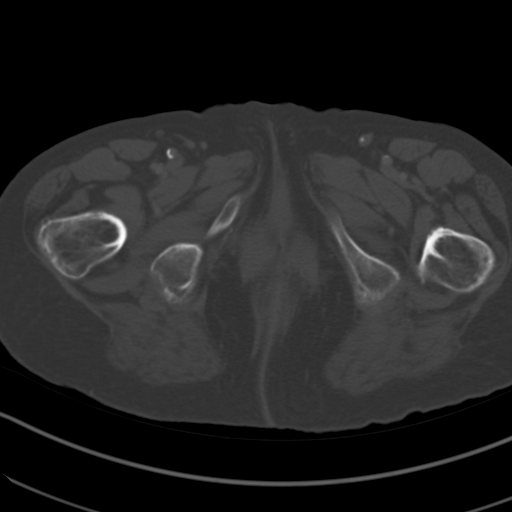
[im 13/84  soft-tissue]
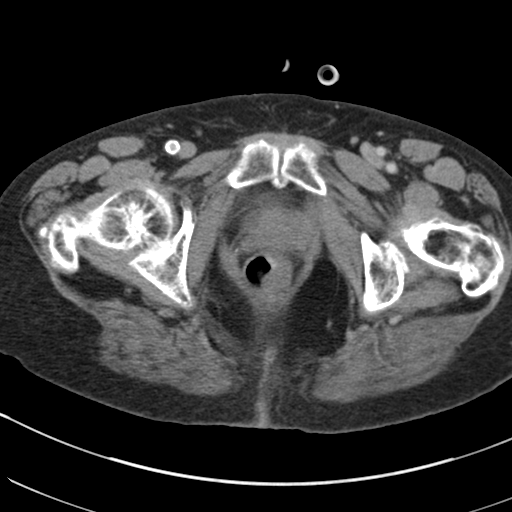
[im 20/84  soft-tissue]
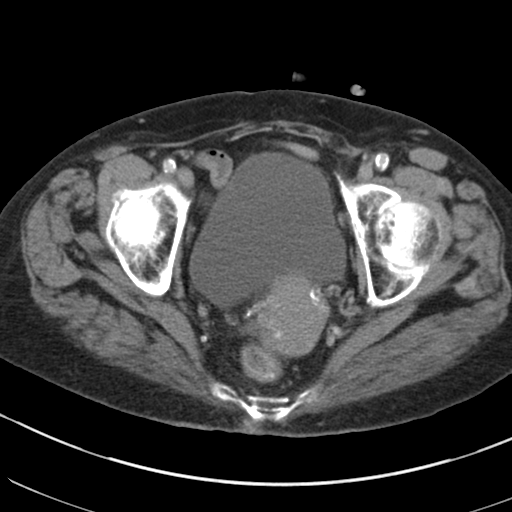
[im 26/84  soft-tissue]
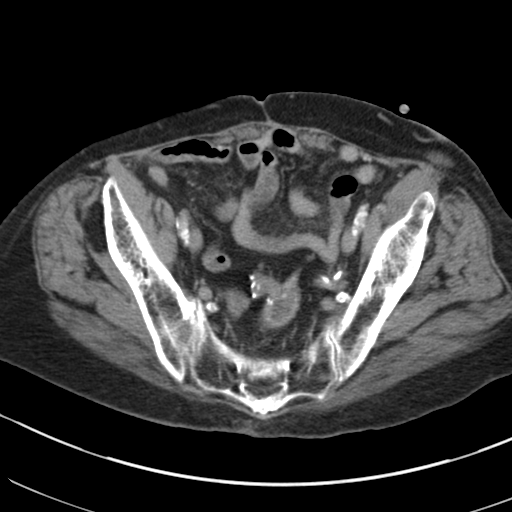
[im 32/84  soft-tissue]
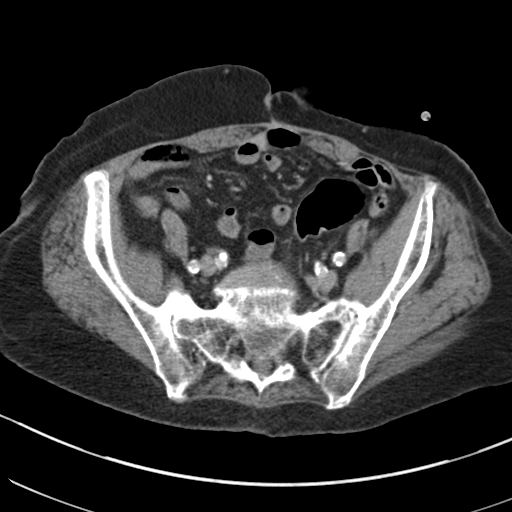
[im 39/84  soft-tissue]
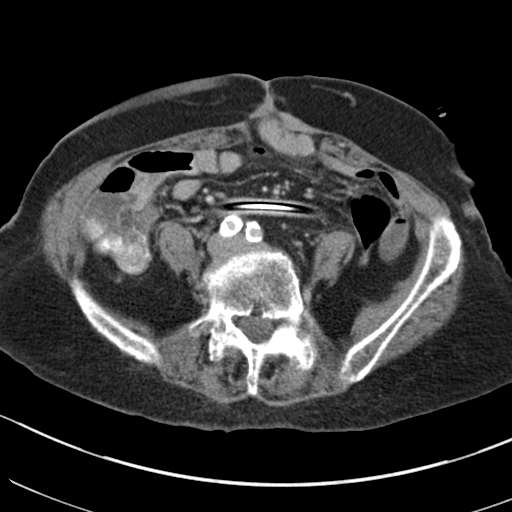
[im 45/84  soft-tissue]
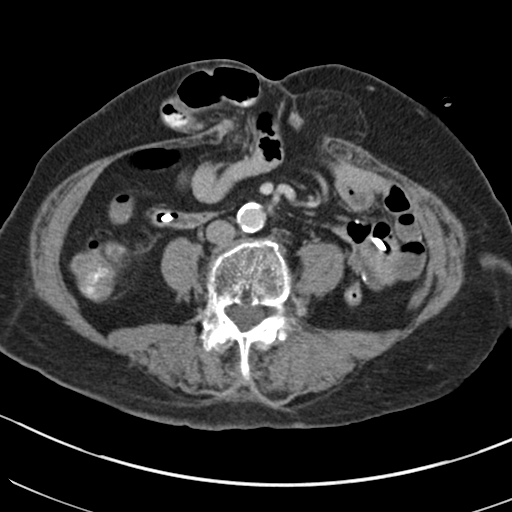
[im 52/84  soft-tissue]
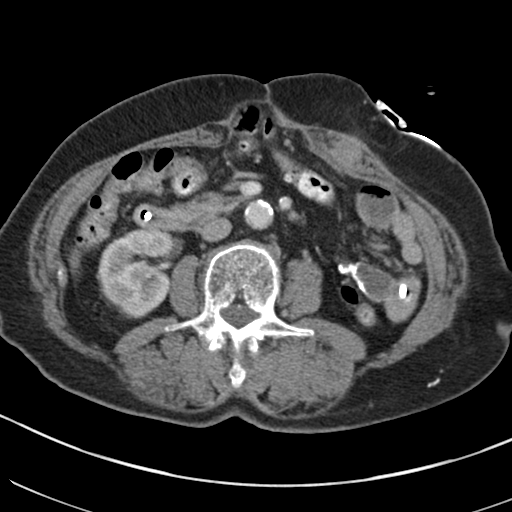
[im 58/84  soft-tissue]
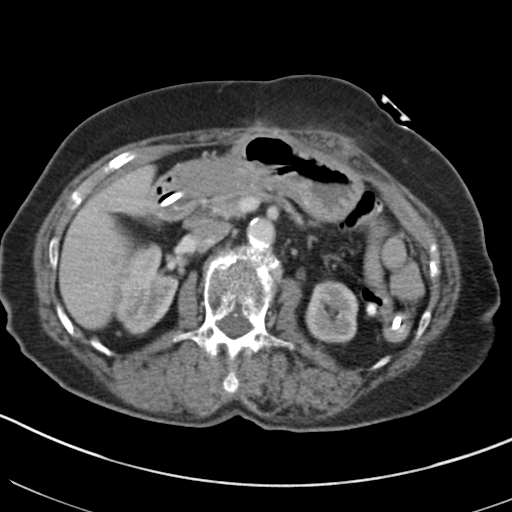
[im 58/84  bone]
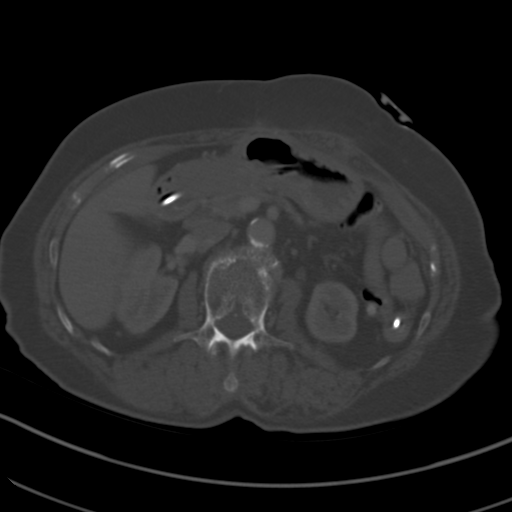
[im 64/84  soft-tissue]
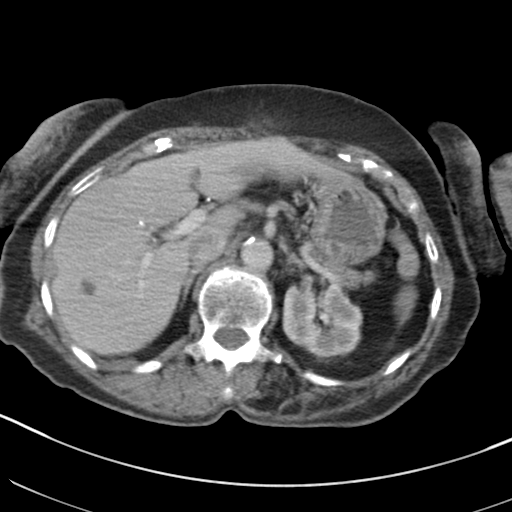
[im 71/84  soft-tissue]
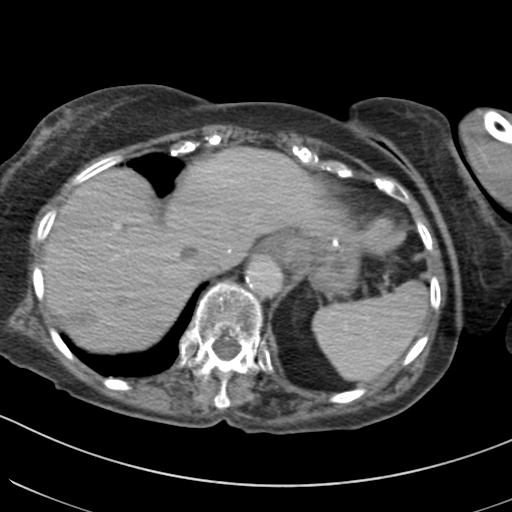
[im 77/84  soft-tissue]
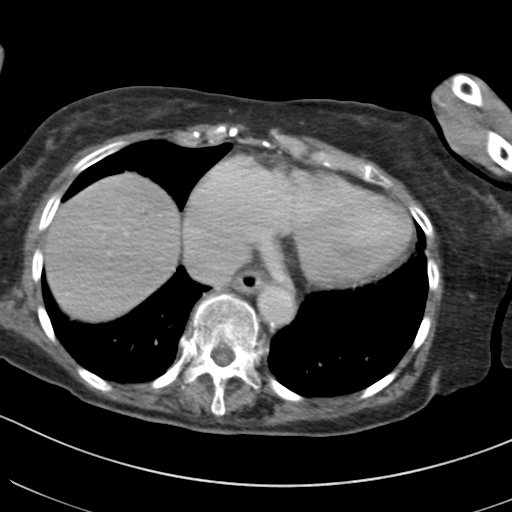

[15 of 46 positions shown; findings below may reference images not displayed]

FINDINGS: LOWER CHEST: Normal.

HEPATOBILIARY: Scattered hypodense foci, too small to characterize
accurately, but most likely hepatic cysts. Otherwise normal liver.
No biliary dilatation. Status post cholecystectomy.

PANCREAS: Normal pancreas. No ductal dilatation or peripancreatic
fluid collection.

SPLEEN: Normal.

ADRENALS/URINARY TRACT: The adrenal glands are normal. No
hydronephrosis, nephroureterolithiasis or solid renal mass. The
urinary bladder is normal for degree of distention

STOMACH/BOWEL: There is a percutaneous jejunostomy tube with tip in
the proximal jejunum. There is a large ventral abdominal hernia that
contains a segment of the transverse colon. No small bowel
dilatation or inflammation. The appendix is not visualized. No right
lower quadrant inflammation or free fluid.

VASCULAR/LYMPHATIC: There is calcific atherosclerosis of the
abdominal aorta. No abdominal or pelvic lymphadenopathy.

REPRODUCTIVE: Normal uterus.  No adnexal mass.

MUSCULOSKELETAL. No bony spinal canal stenosis or focal osseous
abnormality.

OTHER: None.
IMPRESSION: 1. Large ventral abdominal hernia that contains a segment of the
transverse colon. No evidence of obstruction or inflammation.
2. Percutaneous jejunostomy tube with tip in the proximal jejunum.
3. Aortic Atherosclerosis ([id number]-EHQ.Q).

## 2022-02-24 IMAGING — CR DG ABDOMEN ACUTE W/ 1V CHEST
3 series · 3 of 3 positions shown · non-contrast
Comparison: 05/18/2019

CLINICAL DATA: Constipation.  Pain around gastrostomy tube site.

EXAM:
DG ABDOMEN ACUTE W/ 1V CHEST

[x chest ap]
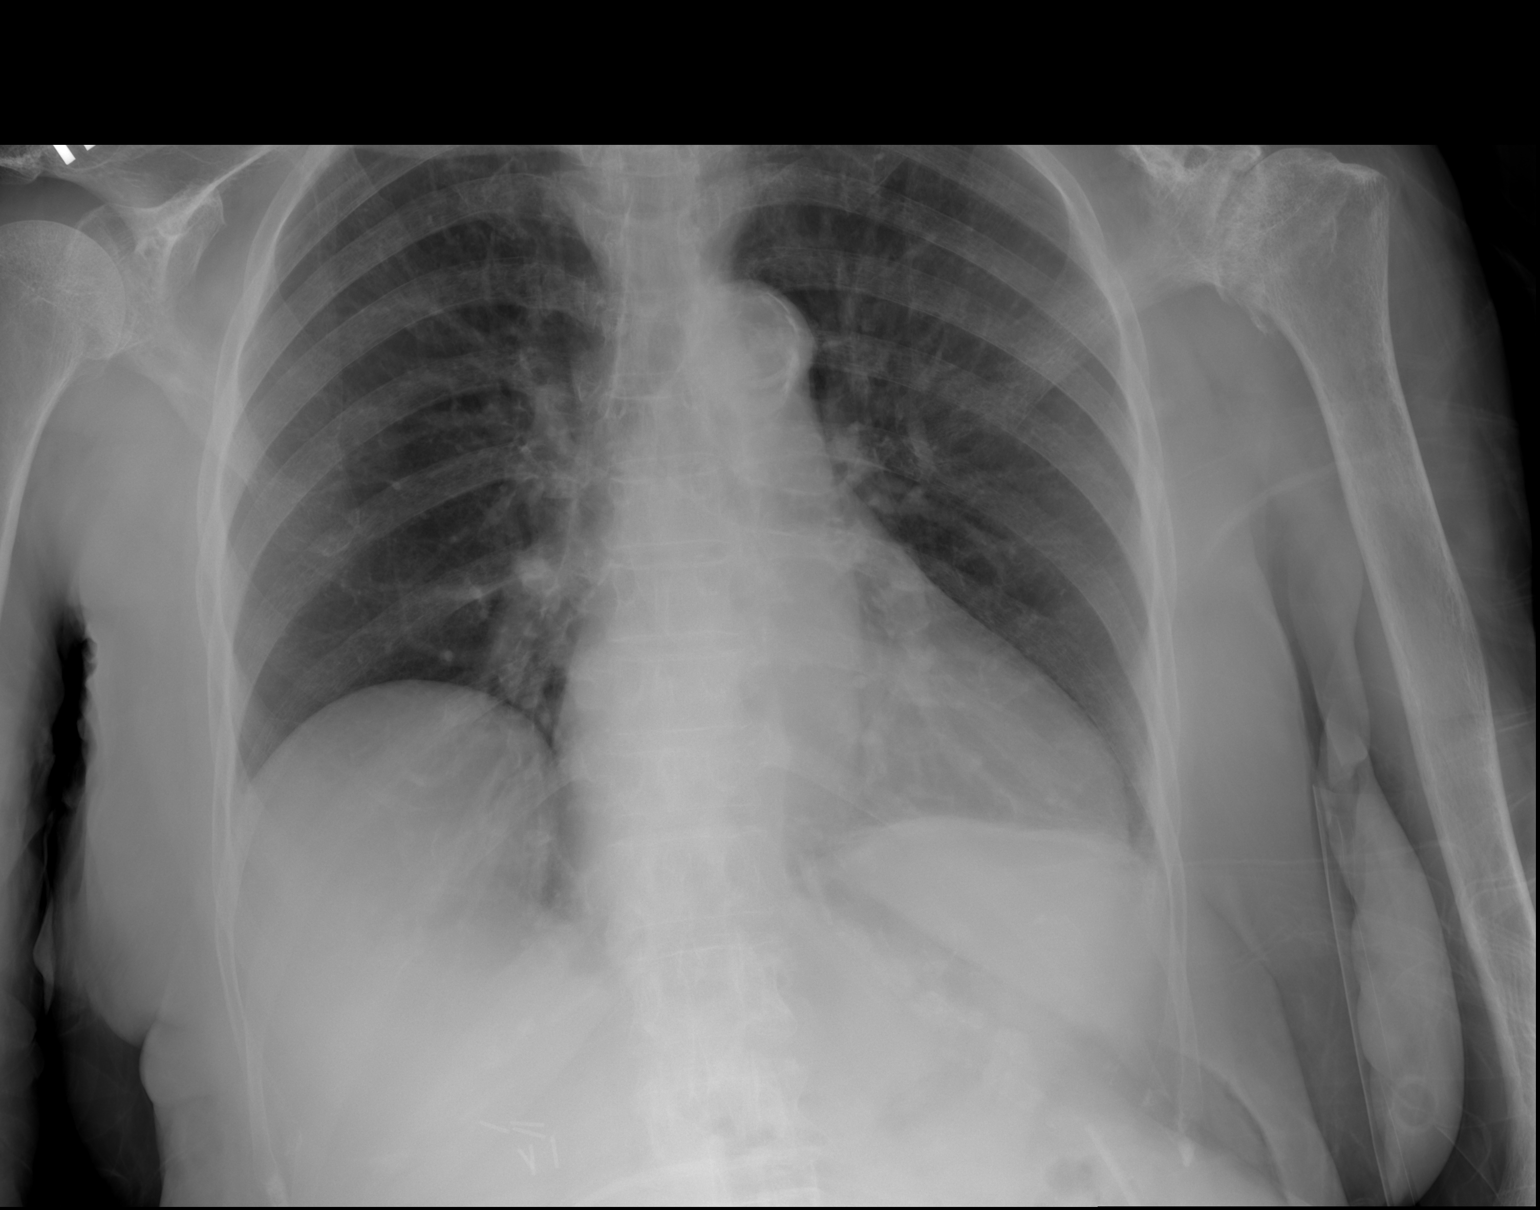

[x abdomen erect]
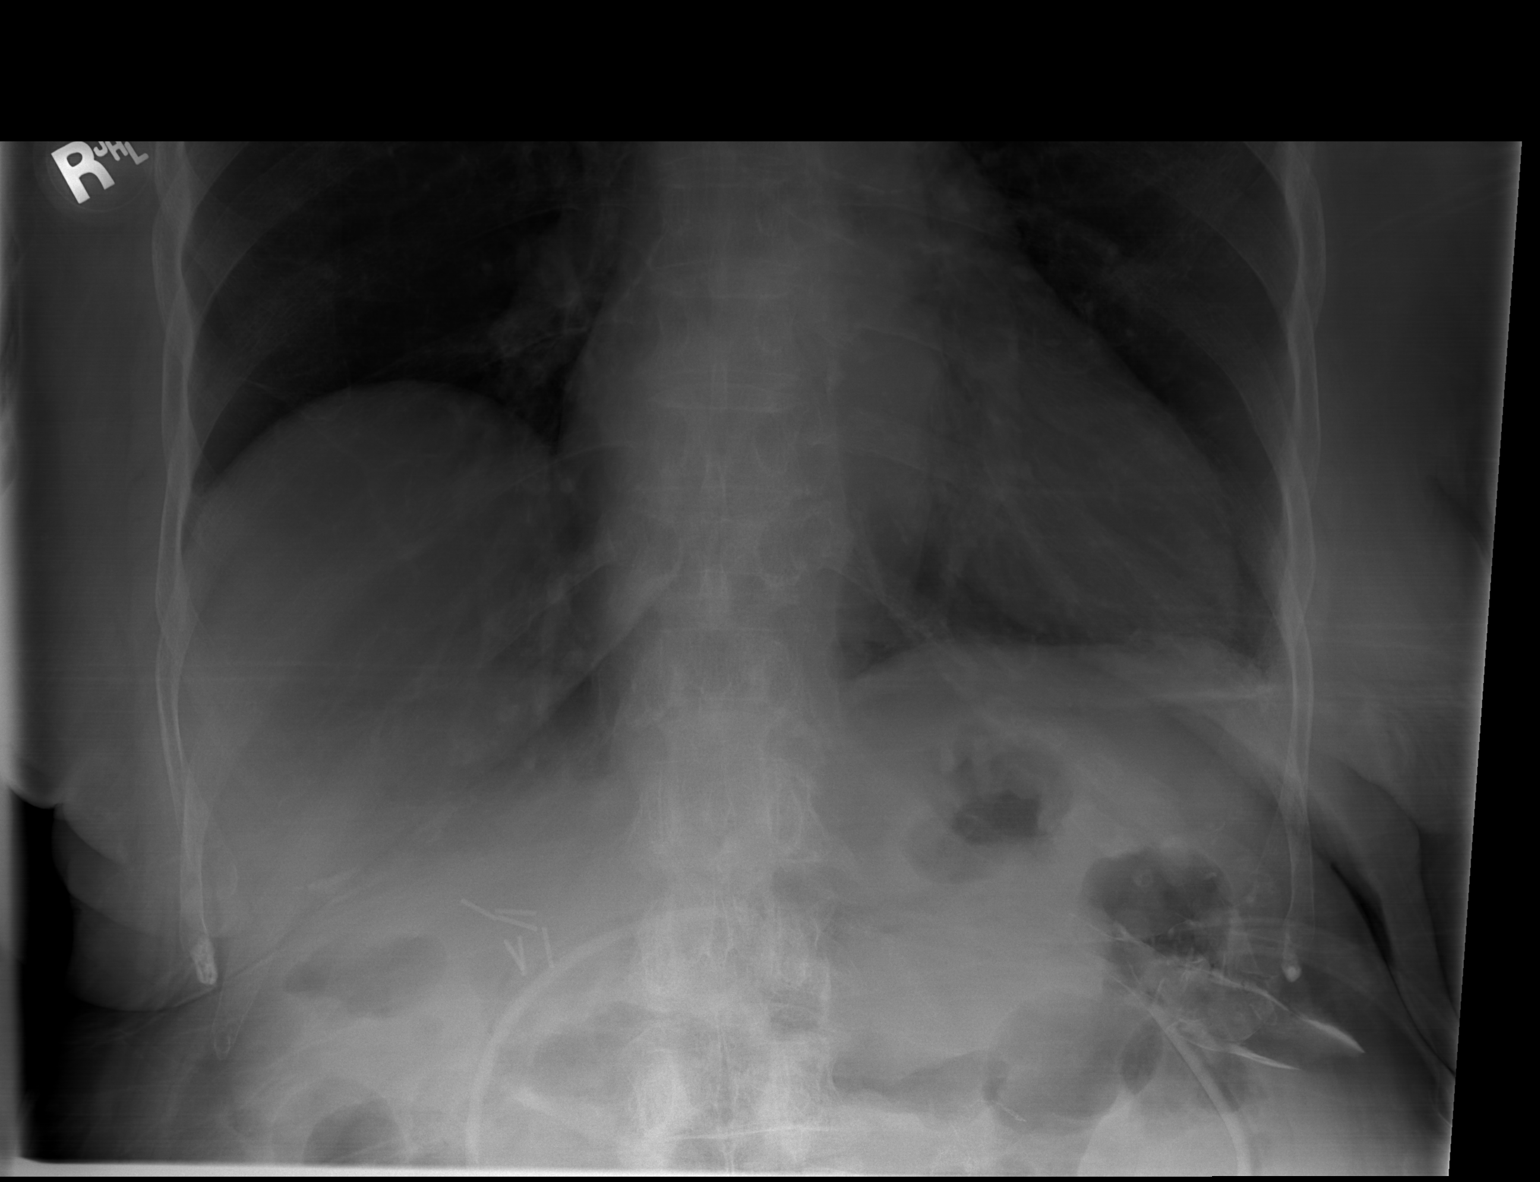

[x abdomen supine]
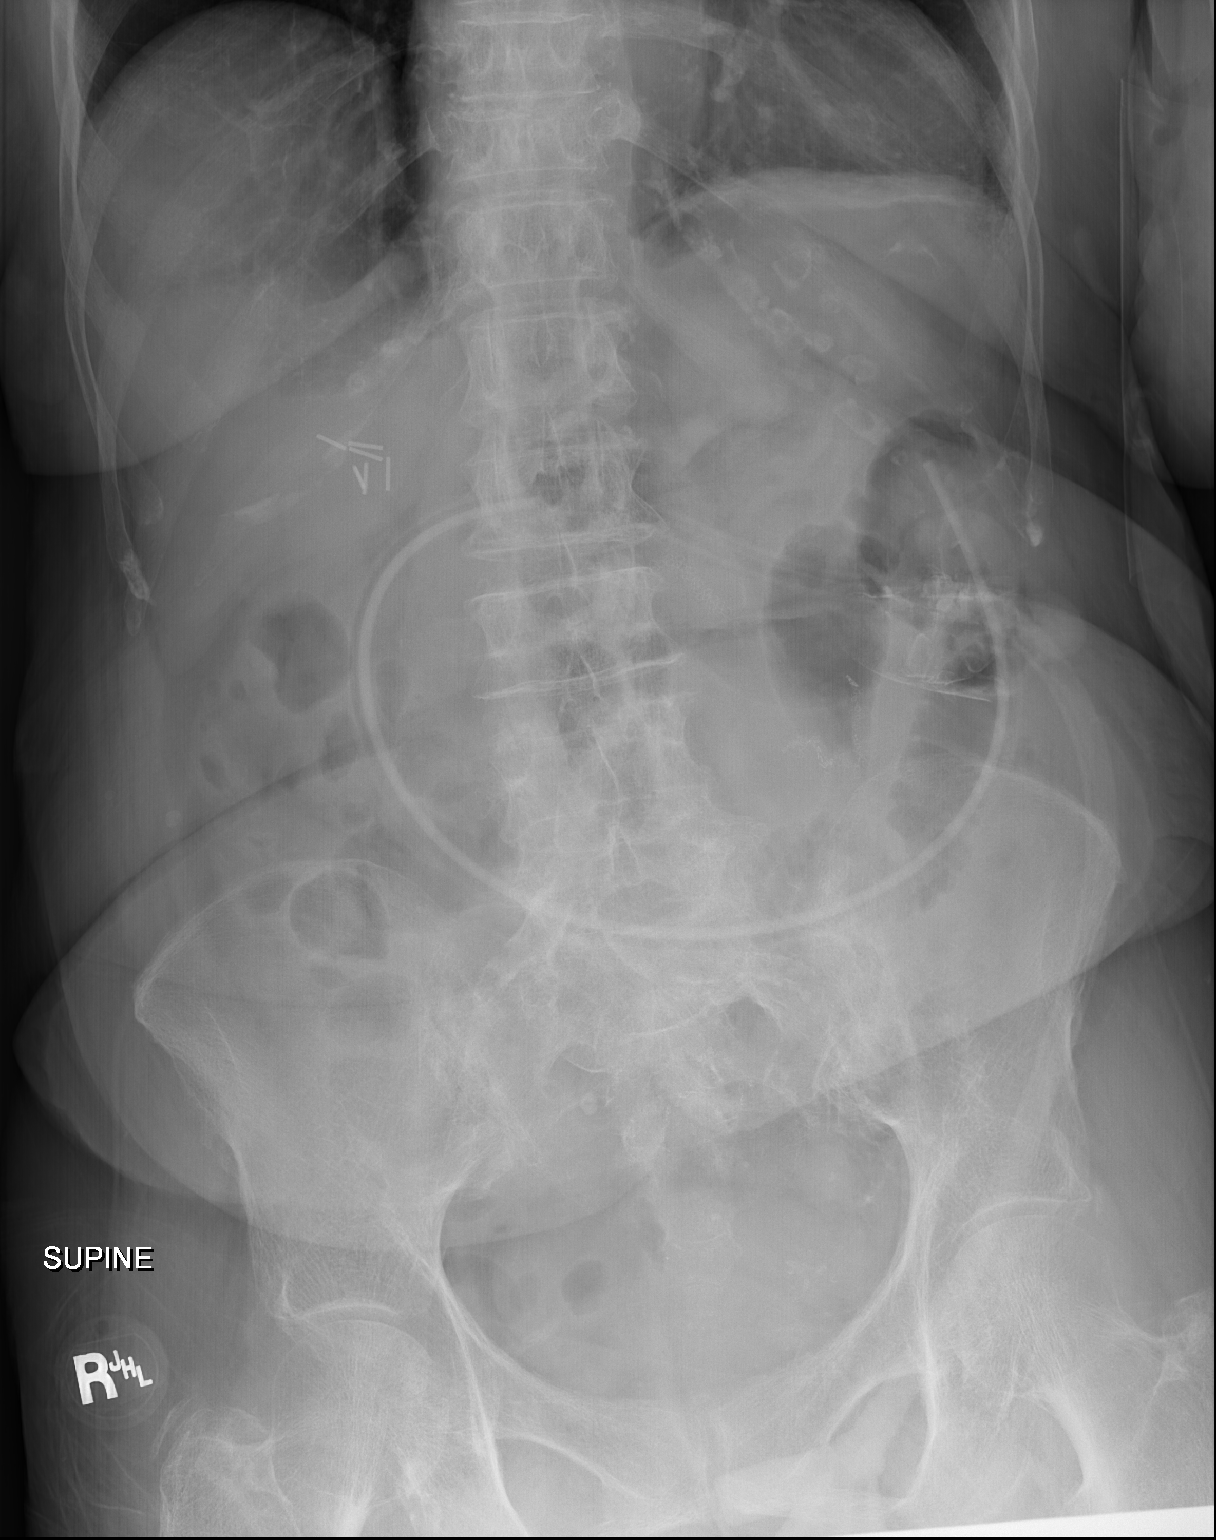

[3 of 3 positions shown; findings below may reference images not displayed]

FINDINGS: A gastrojejunostomy tube is identified within the abdomen. This
appears similar in position to the previous exam. No dilated loops
of bowel identified. Cholecystectomy clips noted. Asymmetric
elevation of right hemidiaphragm.
IMPRESSION: Nonobstructive bowel gas pattern. Unchanged position of
gastrojejunostomy tube.

## 2022-02-26 IMAGING — XA IR REPLACE G/J TUBE W/ FLUORO
1 series · 4 of 4 positions shown · non-contrast
Comparison: Fluoroscopic guided exchange gastrojejunostomy
tube-06/06/2019;

INDICATION: Inadvertent removal of chronic gastrojejunostomy tube. Please
perform fluoroscopic guided exchange.

EXAM:
FLUOROSCOPIC GUIDED REPLACEMENT OF GASTROJEJUNOSTOMY TUBE

[Series 300: tube placements · 4 of 4 slices shown]
[im 1/4]
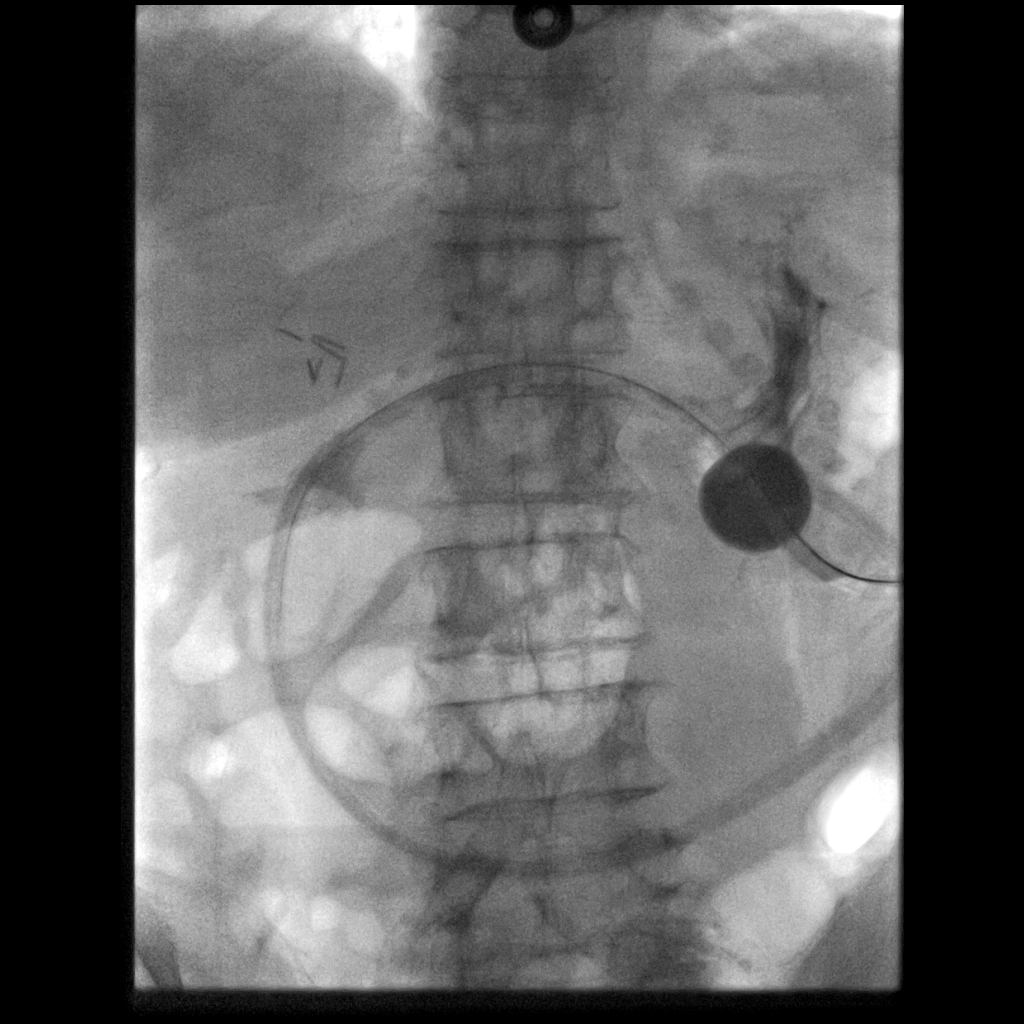
[im 2/4]
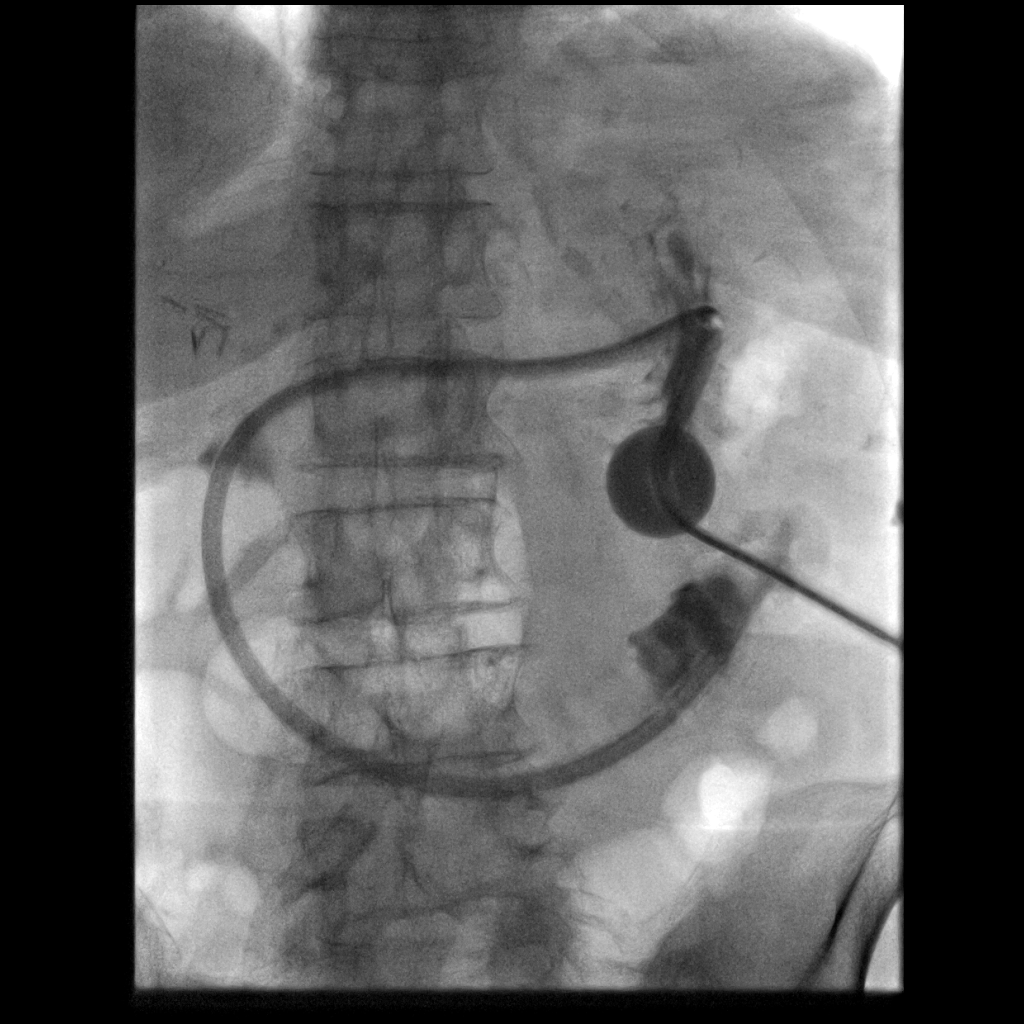
[im 3/4]
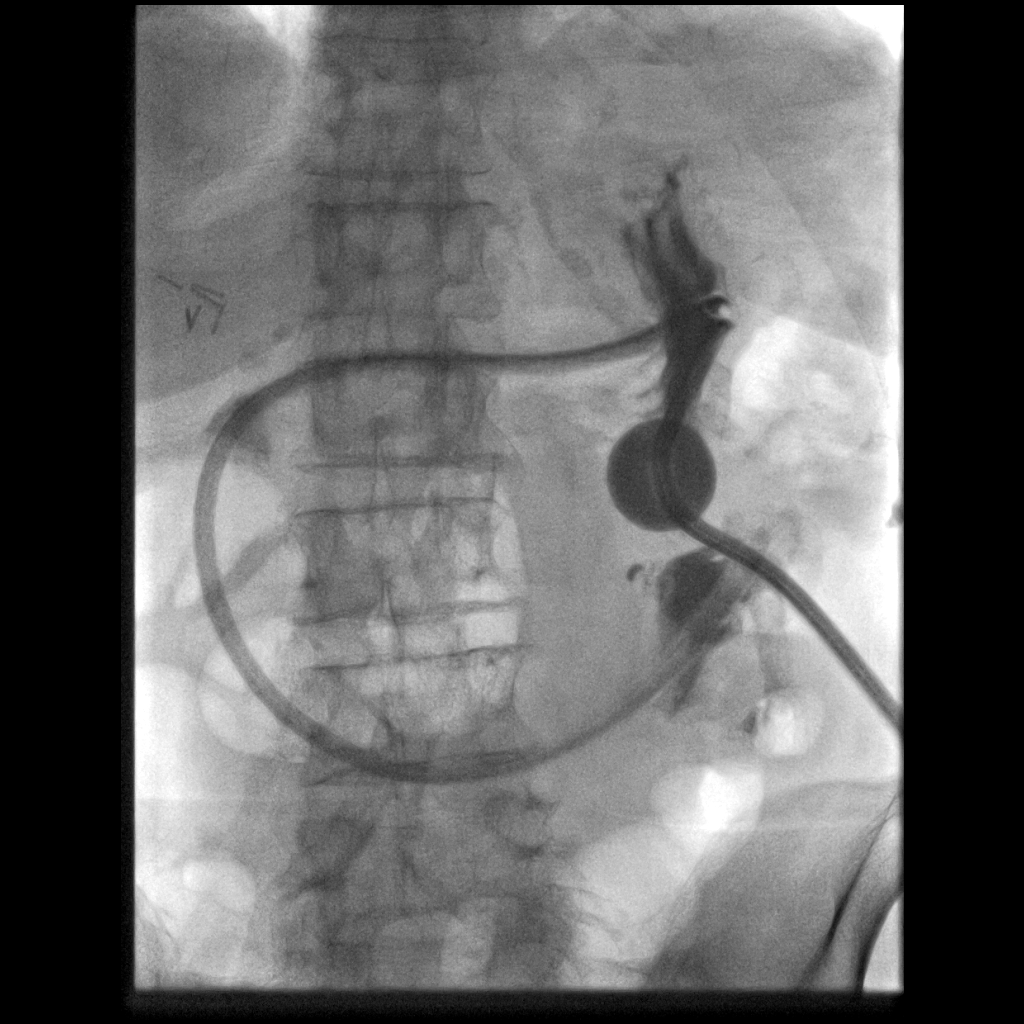
[im 4/4]
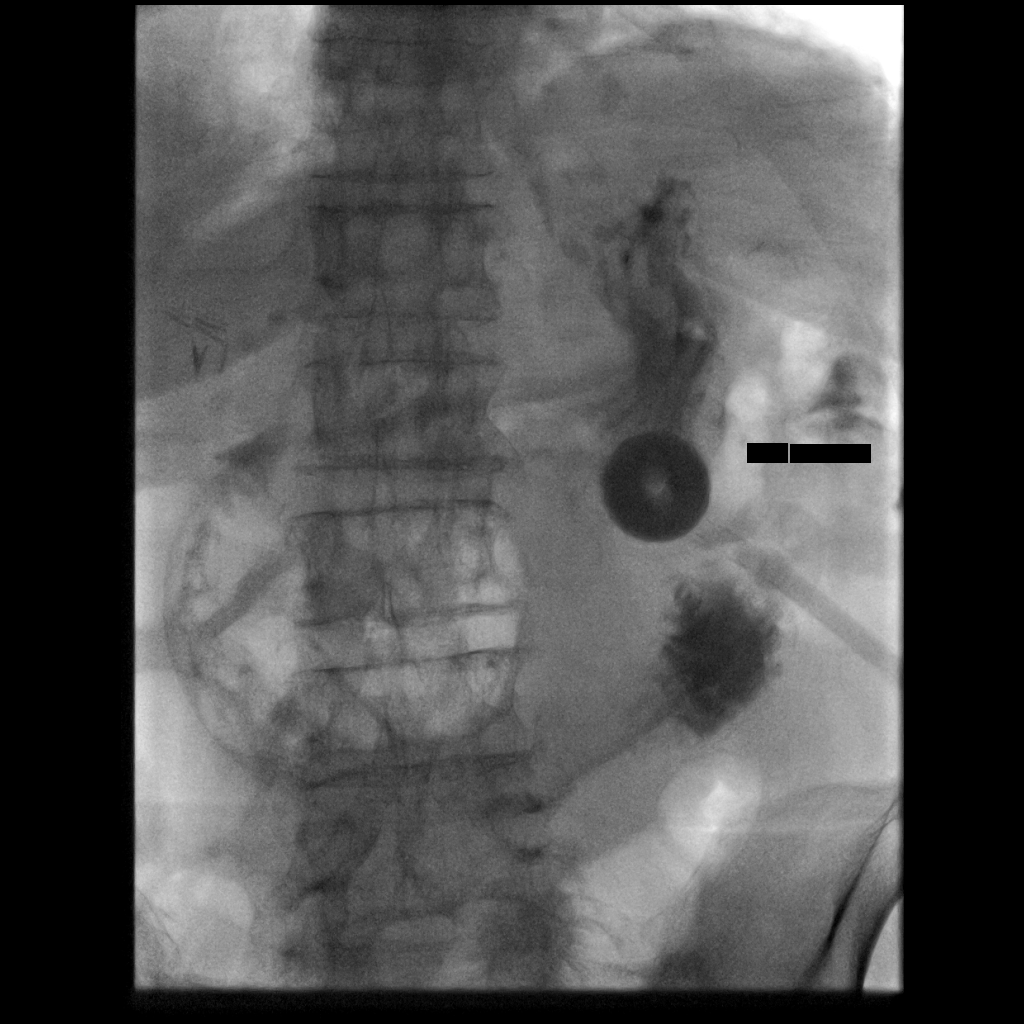

[4 of 4 positions shown; findings below may reference images not displayed]

CT abdomen and pelvis-09/24/2019

MEDICATIONS:
None.

CONTRAST:  20 cc Omnipaque 300

FLUOROSCOPY TIME:  48 seconds

COMPLICATIONS:
None.

PROCEDURE:
Informed written consent was obtained from the patient after a
discussion of the risks and benefits. The upper abdomen and the
external portion of the existing retracted gastrojejunostomy tube
was prepped and draped in the usual sterile fashion, and a sterile
drape was applied covering the operative field. Maximum barrier
sterile technique with sterile gowns and gloves were used for the
procedure. A timeout was performed prior to the initiation of the
procedure.

The external portion of the gastrojejunostomy tube was cut and a 4
French angled glide catheter was advanced through the jejunal lumen.
Contrast injection demonstrated positioning within the gastric
lumen.

Next, with the use of a stiff Glidewire, the 4 French angled glide
catheter was advanced to the level of the proximal jejunum. Contrast
injection confirmed appropriate positioning.

Next, under intermittent fluoroscopic guidance, the 4 French angled
glide catheter and remaining portion of the gastrojejunostomy
catheter were exchanged for a new 22 French gastrojejunostomy
catheter with tip ultimately terminating within the proximal
jejunum.

The retention balloon was insufflated with approximately 10 cc of
saline and dilute contrast and pulled against the anterior wall the
gastric lumen. The external disc was cinched.

Contrast injection demonstrated appropriate positioning and
functionality of both the gastric and jejunal lumens. A dressing was
applied. The patient tolerated procedure well without immediate
postprocedural complication.
IMPRESSION: Successful fluoroscopic guided replacement of 22 French balloon
retention gastrojejunostomy tube with tip ultimately terminating
within the proximal jejunum.

## 2022-02-27 ENCOUNTER — Ambulatory Visit (INDEPENDENT_AMBULATORY_CARE_PROVIDER_SITE_OTHER): Payer: Medicare Other | Admitting: Podiatry

## 2022-02-27 DIAGNOSIS — L97512 Non-pressure chronic ulcer of other part of right foot with fat layer exposed: Secondary | ICD-10-CM | POA: Diagnosis not present

## 2022-02-27 DIAGNOSIS — E0859 Diabetes mellitus due to underlying condition with other circulatory complications: Secondary | ICD-10-CM

## 2022-02-27 NOTE — Progress Notes (Signed)
Chief Complaint  Patient presents with   Wound Check    "It still feels sore."   Subjective:  Patient presenting today for follow-up evaluation with her daughter of chronic wounds to bilateral feet.  For now we are continuing palliative care.  Patient states that the wounds are stable.  She continues to have some tenderness but overall doing well.  Patient is also concerned due to new ulcer development to the left fourth toe.  She has history of prior amputations to the second and third digit.  This has been present for about 1 week now.  Presenting for follow-up treatment and evaluation  Past Medical History:  Diagnosis Date   Acute respiratory failure ([place])    Anemia    previous blood transfusions   Arthritis    "all over"   Asthma    Atrial flutter (HCC)    Bradycardia    requiring previous d/c of BB and reduction of amiodarone   CAD (coronary artery disease)    nonobstructive per notes   Chronic diastolic CHF (congestive heart failure) ([place])    CKD (chronic kidney disease), stage III ([place])    Complication of blood transfusion    "got the wrong blood type at [facility] in ~ 2015; no adverse reaction that we are aware of"/daughter, [name] (01/27/2016)   COPD (chronic obstructive pulmonary disease) ([place])    Depression    "light case"   DVT (deep venous thrombosis) ([place]) 01/2016   a. LLE DVT 01/2016 - switched from Eliquis to Coumadin.   Dyspnea    with some activity   Gastric stenosis    a. s/p stomach tube   GERD (gastroesophageal reflux disease)    GI bleed    Headache    History of 2019 novel coronavirus disease (COVID-19)    History of blood transfusion    "several" (01/27/2016)   History of stomach ulcers    Hyperlipidemia    Hypertension    Hypothyroidism    On home oxygen therapy    "2L; 9PM til 9AM" (06/27/2017)   PAD (peripheral artery disease) (HCC)    a. prior gangrene, toe amputations, intervention.   PAF (paroxysmal atrial fibrillation) ([place])     Paraesophageal hernia    Perforated gastric ulcer ([place])    Pneumonia    "a few times" (06/27/2017)   Seasonal allergies    SIADH (syndrome of inappropriate ADH production) ([place])    [name] 01/10/2015   Small bowel obstruction ([place])    "I don't know how many" (01/11/2015)   Stroke ([place])    several- no residual   Type II diabetes mellitus ([place])    "related to prednisone use  for > 20 yr; once predinose stopped; no more DM RX" (01/27/2016)   UTI (urinary tract infection) 02/08/2016   Ventral hernia with bowel obstruction     LT foot 12/16/2021  RT foot 12/16/2021  Objective: Physical Exam General: The patient is alert and oriented x3 in no acute distress.  Dermatology: There has been some increase in the wound size of the right plantar foot.  New ulcer development to the left fourth toe medial aspect adjacent to the hallux.  There is exposed bone with debridement.  It does appear chronic in nature.  There is no acute inflammatory condition at the moment  Vascular: Established PAD.  Monitored closely by vein and vascular.  Balloon angioplasty with vein and vascular.  Lower extremity angiography 12/24/2020  VAS [place] ABI W/WO TBI 01/25/2021 Right: Resting  right ankle-brachial index indicates mild right lower  extremity arterial disease.  Left: Resting left ankle-brachial index indicates noncompressible left  lower extremity arteries. The left toe-brachial index is abnormal.   Neurological: Epicritic and protective threshold diminished bilaterally.   Musculoskeletal Exam: Status post TMA right foot.  Hallux valgus left with history of overlying ulcer and degenerative changes.  Second and third toe amputation left.  Assessment: 1. s/p transmetatarsal amputation right foot. DOS: 11/14/2019 2.  H/o 2nd and 3rd toe amputation left 3.  Chronic ulcers bilateral feet 4.  Chronic osteomyelitis bilateral feet  Plan of Care:  1. Patient was evaluated.   2.  Patient states that home health  dressing changes have been discontinued.  Patient has been applying Iodosorb with Aquacel Ag to the feet daily.  Continue. 3.  Continue palliative care for now and routine debridement.  Surgery was discussed today however the patient declined.  For this reason no x-rays were taken today especially since there is no acute infectious concerns at the moment.  All wounds appear very chronic and stable even the new wound to the left fourth toe. 4.  Continue wearing postsurgical shoes bilateral  5.  Medically necessary excisional debridement of the wounds was performed today using a tissue nipper.  Excisional debridement of all necrotic nonviable tissue down to healthier bleeding viable tissue was performed with postdebridement measurement same as pre- 6.  Patient is on chronic doxycycline 50 mg daily.   7.  Return to clinic 6 weeks for conservative palliative care  Edrick Kins, DPM [facility]  Dr. Edrick Kins, DPM    [address], [place] [address]                Office [phone]  Fax [phone]

## 2022-03-03 ENCOUNTER — Telehealth: Payer: Self-pay

## 2022-03-03 DIAGNOSIS — B370 Candidal stomatitis: Secondary | ICD-10-CM

## 2022-03-03 MED ORDER — NYSTATIN 100000 UNIT/ML MT SUSP: 5.0000 mL | Freq: Four times a day (QID) | OROMUCOSAL | 0 refills | Status: DC

## 2022-03-03 NOTE — Telephone Encounter (Signed)
Patients daughter called requesting rx for nystatin oral suspension. Patient is on an antibiotic prescribed by the foot specialist and [name] believes it's causing her mother to have thrush in her mouth.  [name] tried to call the foot specialist and there office closed at 12 pm.   Please advise

## 2022-03-03 NOTE — Telephone Encounter (Signed)
Patients daughter is aware

## 2022-03-03 NOTE — Telephone Encounter (Signed)
Rx for nystatin oral sent to pharmacy

## 2022-03-06 ENCOUNTER — Telehealth: Payer: Self-pay | Admitting: *Deleted

## 2022-03-06 NOTE — Telephone Encounter (Signed)
Patient has developed thrust , first noticed Friday, pcp has given her nystatin for 3 days, does she decrease or discontinue the prescribed doxycycline given by our office ?Please advise.

## 2022-03-07 NOTE — Telephone Encounter (Signed)
Please notify patient to discontinue the doxycycline for now until next follow-up appointment on 04/10/2022.  Thanks, Dr. Amalia Hailey

## 2022-03-07 NOTE — Telephone Encounter (Signed)
Patient's daughter has been updated to discontinue the doxy until next f/u appointment on 04/10/22.

## 2022-03-09 ENCOUNTER — Other Ambulatory Visit (HOSPITAL_COMMUNITY): Payer: Medicare Other

## 2022-03-13 ENCOUNTER — Telehealth: Payer: Medicare Other

## 2022-03-13 ENCOUNTER — Telehealth: Payer: Self-pay | Admitting: Cardiology

## 2022-03-13 NOTE — Telephone Encounter (Signed)
Pts daughter [name] is calling in to let Dr. Johney Frame know that the pt has been complaining of muscle soreness to her chest area, especially to touch.  She states it's not like cardiac chest pain, but more so muscular skeletal pain. Pt has also been having elevated heart rates at times, but they are normal as of yesterday and today.  Daughter states the pt is complaining of itching to her right side of her chest area.    Daughter states all her symptoms started when the pts son passed away in late 02-15-23.  She states they buried her Son on 12/2, which was one of her worse days with these complaints.    Daughter states the pt is very anxious and upset since losing her son and especially since his funeral on 12/2.  Daughter states the pt has no complaints of cardiac chest pain, sob, doe, palpitations, dizziness, N/V, orthopnea, swelling to her upper/lower extremities, weakness, pre-syncopal or syncopal episodes.   She states her pressures are good and have been running in the [id number] systolic and 00-86P diastolic.  She states her rates yesterday were 43 and today 70s.  She states her symptoms and complaints do not seem related but the pt is worried about them, and would be more reassured if she could see someone about her symptoms, given everything that has recently occurred with losing her son.  Daughter states the pt has no rash or hives on her skin.  She states the pt said her skin isn't dry in appearance but very itchy.   Scheduled the pt to come into the office to see Ambrose Pancoast NP for this Thursday 12/14 at 2:20 pm.  Daughter aware to have her here 15 mins prior to this appt.   Daughter aware to take her to the ED, if cardiac symptoms occur.  Advised her to continue monitoring her numbers at home and watch for any skin rash.  Advised her to try putting lotion on the pts skin to help with any dryness and to help alleviate the itching.  Advised her to have pt pat any areas that itch and do  not scratch.  Advised the daughter that this all may very well be likely due to anxiety from recent loss of her son.   Daughter is aware that I will route this plan to Dr. Johney Frame to make her aware of everything.   Daughter verbalized understanding and agrees with this plan.

## 2022-03-13 NOTE — Telephone Encounter (Signed)
Patient's daughter [name]) was advised and states she will increase the Cymbalta also patient have appointment scheduled already in January,2024 with Dani Gobble.

## 2022-03-13 NOTE — Telephone Encounter (Signed)
It looks like she has an appt to see cardiologist, if she feels like we need to increase medication due to anxiety we could increase cymbalta- she can go to 60 mg by mouth daily but will need to follow up in 4 weeks.

## 2022-03-13 NOTE — Telephone Encounter (Signed)
STAT if HR is under 50 or over 120 (normal HR is 60-100 beats per minute)  What is your heart rate? 88 - Yesterday  Do you have a log of your heart rate readings (document readings)?  123 134 131 133   Do you have any other symptoms? Left side muscle soreness and itching on right side of chest

## 2022-03-13 NOTE — Telephone Encounter (Signed)
Patient's daughter [name]) called to report after son passed they noticed elevated pulse reading with patient since 11/24-12/1&03/06/22 arranging around 120's-130's. She reports after the funeral on 03/04/22 the patient have complained about left/ right side itching and soreness in her chest. She doesn't know if the patient is having an anxiety attack or just upset about her son's passing and would like to know what to do. She also contacted patient's cardiology office.

## 2022-03-15 NOTE — Progress Notes (Unsigned)
Office Visit    Patient Name: Shannon [name] Date of Encounter: 03/16/2022  Primary Care Provider:  [name], NP Primary Cardiologist:  None Primary Electrophysiologist: None  Chief Complaint    Shannon [name] is a 88 y.o. female with PMH of chronic diastolic CHF, CVA, COPD, CKD stage III, insulin-dependent DM II, paroxysmal AF (on Coumadin), gastric outlet obstruction s/p gastro jejunostomy feeding tube, anemia of chronic disease, PAD s/p bilateral interventions followed by VVS, hypothyroidism, HTN, HLD who presents today for complaint of chest discomfort and elevated heart rate.  Past Medical History    Past Medical History:  Diagnosis Date   Acute respiratory failure ([place])    Anemia    previous blood transfusions   Arthritis    "all over"   Asthma    Atrial flutter (HCC)    Bradycardia    requiring previous d/c of BB and reduction of amiodarone   CAD (coronary artery disease)    nonobstructive per notes   Chronic diastolic CHF (congestive heart failure) ([place])    CKD (chronic kidney disease), stage III ([place])    Complication of blood transfusion    "got the wrong blood type at [facility] in ~ 2015; no adverse reaction that we are aware of"/daughter, [name] (01/27/2016)   COPD (chronic obstructive pulmonary disease) ([place])    Depression    "light case"   DVT (deep venous thrombosis) ([place]) 01/2016   a. LLE DVT 01/2016 - switched from Eliquis to Coumadin.   Dyspnea    with some activity   Gastric stenosis    a. s/p stomach tube   GERD (gastroesophageal reflux disease)    GI bleed    Headache    History of 2019 novel coronavirus disease (COVID-19)    History of blood transfusion    "several" (01/27/2016)   History of stomach ulcers    Hyperlipidemia    Hypertension    Hypothyroidism    On home oxygen therapy    "2L; 9PM til 9AM" (06/27/2017)   PAD (peripheral artery disease) (HCC)    a. prior gangrene, toe amputations, intervention.   PAF (paroxysmal  atrial fibrillation) ([place])    Paraesophageal hernia    Perforated gastric ulcer ([place])    Pneumonia    "a few times" (06/27/2017)   Seasonal allergies    SIADH (syndrome of inappropriate ADH production) ([place])    [name] 01/10/2015   Small bowel obstruction ([place])    "I don't know how many" (01/11/2015)   Stroke ([place])    several- no residual   Type II diabetes mellitus ([place])    "related to prednisone use  for > 20 yr; once predinose stopped; no more DM RX" (01/27/2016)   UTI (urinary tract infection) 02/08/2016   Ventral hernia with bowel obstruction    Past Surgical History:  Procedure Laterality Date   ABDOMINAL AORTOGRAM N/A 09/21/2016   Procedure: Abdominal Aortogram;  Surgeon: Waynetta Sandy, MD;  Location: [facility];  Service: Cardiovascular;  Laterality: N/A;   ABDOMINAL AORTOGRAM W/LOWER EXTREMITY N/A 10/20/2020   Procedure: ABDOMINAL AORTOGRAM W/LOWER EXTREMITY;  Surgeon: Cherre Robins, MD;  Location: [facility];  Service: Cardiovascular;  Laterality: N/A;   AMPUTATION Left 09/25/2016   Procedure: AMPUTATION DIGIT- LEFT 2ND AND 3RD TOES;  Surgeon: Rosetta Posner, MD;  Location: [facility];  Service: Vascular;  Laterality: Left;   AMPUTATION Right 05/03/2018   Procedure: AMPUTATION RIGHT GREAT TOE;  Surgeon: Curt Jews  F, MD;  Location: [facility];  Service: Vascular;  Laterality: Right;   CATARACT EXTRACTION W/ INTRAOCULAR LENS  IMPLANT, BILATERAL     CHOLECYSTECTOMY OPEN     COLECTOMY     ESOPHAGOGASTRODUODENOSCOPY N/A 01/19/2014   Procedure: ESOPHAGOGASTRODUODENOSCOPY (EGD);  Surgeon: Irene Shipper, MD;  [facility];  Service: Endoscopy;  Laterality: N/A;   ESOPHAGOGASTRODUODENOSCOPY N/A 01/20/2014   Procedure: ESOPHAGOGASTRODUODENOSCOPY (EGD);  Surgeon: Irene Shipper, MD;  Location: [facility];  Service: Endoscopy;  Laterality: N/A;   ESOPHAGOGASTRODUODENOSCOPY N/A 03/19/2014   Procedure: ESOPHAGOGASTRODUODENOSCOPY (EGD);  Surgeon: Milus Banister, MD;  Location: [facility];  Service: Endoscopy;  Laterality: N/A;   ESOPHAGOGASTRODUODENOSCOPY N/A 07/08/2015   Procedure: ESOPHAGOGASTRODUODENOSCOPY (EGD);  Surgeon: Doran Stabler, MD;  Location: [facility];  Service: Endoscopy;  Laterality: N/A;   ESOPHAGOGASTRODUODENOSCOPY (EGD) WITH PROPOFOL N/A 09/15/2015   Procedure: ESOPHAGOGASTRODUODENOSCOPY (EGD) WITH PROPOFOL;  Surgeon: Manus Gunning, MD;  [facility];  Service: Gastroenterology;  Laterality: N/A;   ESOPHAGOGASTRODUODENOSCOPY (EGD) WITH PROPOFOL N/A 10/12/2020   Procedure: ESOPHAGOGASTRODUODENOSCOPY (EGD) WITH PROPOFOL;  Surgeon: Gatha Mayer, MD;  Location: [place];  Service: Endoscopy;  Laterality: N/A;   FOOT SURGERY     GASTROJEJUNOSTOMY     hx/notes 01/10/2015   GASTROJEJUNOSTOMY N/A 09/23/2015   Procedure: OPEN GASTROJEJUNOSTOMY TUBE PLACEMENT;  Surgeon: Arta Bruce Kinsinger, MD;  [facility];  Service: General;  Laterality: N/A;   GLAUCOMA SURGERY Bilateral    HEMATOMA EVACUATION Right 10/20/2020   Procedure: EVACUATION HEMATOMA RIGHT GROIN;  Surgeon: Cherre Robins, MD;  Location: [place];  Service: Vascular;  Laterality: Right;   IR CM INJ ANY COLONIC TUBE W/FLUORO  01/05/2017   IR CM INJ ANY COLONIC TUBE W/FLUORO  06/07/2017   IR CM INJ ANY COLONIC TUBE W/FLUORO  11/05/2017   IR CM INJ ANY COLONIC TUBE W/FLUORO  09/18/2018   IR CM INJ ANY COLONIC TUBE W/FLUORO  03/06/2019   IR CM INJ ANY COLONIC TUBE W/FLUORO  03/11/2019   IR GASTR TUBE CONVERT GASTR-JEJ PER W/FL MOD SED  04/30/2020   IR GENERIC HISTORICAL  01/07/2016   IR [place] TUBE CHANGE 01/07/2016 Jacqulynn Cadet, MD [facility]   IR GENERIC HISTORICAL  01/27/2016   IR MECH REMOV OBSTRUC MAT ANY COLON TUBE W/FLUORO 01/27/2016 Markus Daft, MD [facility]   IR GENERIC HISTORICAL  02/07/2016   IR PATIENT EVAL TECH 0-60 MINS Darrell K Allred, PA-C [facility]   IR GENERIC HISTORICAL  02/08/2016   IR [place] TUBE CHANGE  02/08/2016 Greggory Keen, MD [facility]   IR GENERIC HISTORICAL  01/06/2016   IR [place] TUBE CHANGE 01/06/2016 [facility] OUT REF   IR GENERIC HISTORICAL  05/02/2016   IR CM INJ ANY COLONIC TUBE W/FLUORO 05/02/2016 Arne Cleveland, MD [facility]   IR GENERIC HISTORICAL  05/15/2016   IR [place] TUBE CHANGE 05/15/2016 Sandi Mariscal, MD [facility]   IR GENERIC HISTORICAL  06/28/2016   IR [place] TUBE CHANGE 06/28/2016 [facility]   IR [place] TUBE CHANGE  02/20/2017   IR [place] TUBE CHANGE  05/10/2017   IR [place] TUBE CHANGE  07/06/2017   IR [place] TUBE CHANGE  08/02/2017   IR [place] TUBE CHANGE  10/15/2017   IR [place] TUBE CHANGE  12/26/2017   IR [place] TUBE CHANGE  04/08/2018   IR [place] TUBE CHANGE  06/19/2018   IR [place] TUBE CHANGE  03/03/2019   IR [place] TUBE CHANGE  06/06/2019  IR GJ TUBE CHANGE  09/26/2019   IR GJ TUBE CHANGE  01/09/2020   IR GJ TUBE CHANGE  05/12/2020   IR GJ TUBE CHANGE  10/05/2020   IR GJ TUBE CHANGE  10/14/2020   IR GJ TUBE CHANGE  10/19/2020   IR GJ TUBE CHANGE  02/25/2021   IR GJ TUBE CHANGE  03/23/2021   IR GJ TUBE CHANGE  08/29/2021   IR GJ TUBE CHANGE  08/30/2021   IR GJ TUBE CHANGE  09/05/2021   IR GJ TUBE CHANGE  01/26/2022   IR PATIENT EVAL TECH 0-60 MINS  10/19/2016   IR PATIENT EVAL TECH 0-60 MINS  12/25/2016   IR PATIENT EVAL TECH 0-60 MINS  01/29/2017   IR PATIENT EVAL TECH 0-60 MINS  04/04/2017   IR PATIENT EVAL TECH 0-60 MINS  04/30/2017   IR PATIENT EVAL TECH 0-60 MINS  07/31/2017   IR REPLC DUODEN/JEJUNO TUBE PERCUT W/FLUORO  11/14/2016   IR REPLC GASTRO/COLONIC TUBE PERCUT W/FLUORO  05/31/2021   IRRIGATION AND DEBRIDEMENT FOOT Bilateral 12/26/2020   Procedure: IRRIGATION AND DEBRIDEMENT FOOT AND BONE BIOPSY;  Surgeon: Trula Slade, DPM;  Location: [facility];  Service: Podiatry;  Laterality: Bilateral;   LAPAROTOMY N/A 01/20/2015   Procedure: EXPLORATORY LAPAROTOMY;  Surgeon: Coralie Keens, MD;  Location: [facility];  Service: General;  Laterality: N/A;   LOWER EXTREMITY ANGIOGRAPHY Left  09/21/2016   Procedure: Lower Extremity Angiography;  Surgeon: Waynetta Sandy, MD;  Location: [facility];  Service: Cardiovascular;  Laterality: Left;   LOWER EXTREMITY ANGIOGRAPHY Right 06/27/2017   Procedure: Lower Extremity Angiography;  Surgeon: Waynetta Sandy, MD;  Location: [facility];  Service: Cardiovascular;  Laterality: Right;   LOWER EXTREMITY ANGIOGRAPHY Right 12/24/2020   Procedure: LOWER EXTREMITY ANGIOGRAPHY;  Surgeon: Cherre Robins, MD;  Location: [facility];  Service: Cardiovascular;  Laterality: Right;   LYSIS OF ADHESION N/A 01/20/2015   Procedure: LYSIS OF ADHESIONS < 1 HOUR;  Surgeon: Coralie Keens, MD;  Location: [facility] [place];  Service: General;  Laterality: N/A;   PERIPHERAL VASCULAR BALLOON ANGIOPLASTY Left 09/21/2016   Procedure: Peripheral Vascular Balloon Angioplasty;  Surgeon: Waynetta Sandy, MD;  Location: [facility];  Service: Cardiovascular;  Laterality: Left;  drug coated balloon   PERIPHERAL VASCULAR BALLOON ANGIOPLASTY Right 06/27/2017   Procedure: PERIPHERAL VASCULAR BALLOON ANGIOPLASTY;  Surgeon: Waynetta Sandy, MD;  Location: [facility];  Service: Cardiovascular;  Laterality: Right;  SFA/[id number]   PERIPHERAL VASCULAR INTERVENTION Left 10/20/2020   Procedure: PERIPHERAL VASCULAR INTERVENTION;  Surgeon: Cherre Robins, MD;  Location: [facility];  Service: Cardiovascular;  Laterality: Left;   TONSILLECTOMY     TRANSMETATARSAL AMPUTATION Right 11/14/2019   Procedure: TRANSMETATARSAL AMPUTATION RIGHT FOOT;  Surgeon: Edrick Kins, DPM;  Location: [facility];  Service: Podiatry;  Laterality: Right;   TUBAL LIGATION     VENTRAL HERNIA REPAIR  2015   incarcerated ventral hernia ([facility] 09/2013)/notes 01/10/2015   WOUND EXPLORATION Right 10/20/2020   Procedure: PRIMARY CLOSURE COMMON FEMORAL ARTERY;  Surgeon: Cherre Robins, MD;  Location: [facility];  Service: Vascular;  Laterality:  Right;    Allergies  Allergies  Allergen Reactions   Penicillins Itching, Rash and Other (See Comments)    Tolerated amoxicillin, unasyn, zosyn & cephalosporins in the past. Did it involve swelling of the face/tongue/throat, SOB, or low BP? No Did it involve sudden or severe rash/hives, skin peeling, or any  reaction on the inside of your mouth or nose? No Did you need to seek medical attention at a hospital or doctor's office? Unknown When did it last happen?      5+ years If all above answers are "NO", may proceed with cephalosporin use.     History of Present Illness    Shannon [name]  is a 88 year old female with the above mention past medical history who presents today for evaluation of chest discomfort and elevated heart rate.  Ms. Mousseau was seen initially by Dr. Marlou Porch in 01/2014 for inpatient consultation for hypertension and chest pain.  She had a left heart cath completed 11/2010 at [facility] that showed 40% ostial LAD stenosis with normal EF and no significant CAD. She was being treated for gastric outlet obstruction and underwent gastro jejunal anastomosis due to anastomotic ulcer.  Stress Myoview was performed 2014/02/28 with no evidence of ischemia with normal LV function.  She was seen again in 03/2014 for recurrent abdominal pain and nausea vomiting.  CT of the abdomen showed partial gastric outlet obstruction.  She was admitted in 2017 for rapid atrial flutter after original admission for dysphagia.She was placed on amiodarone and spontaneously converted to normal sinus rhythm. She also noted to have elevated creatinine of 1.6 c/w acute on chronic renal insufficiency.  She was admitted in 11/2018 for syncope and bradycardia.  Her Coreg was stopped and Norvasc was added for BP control.  She was subsequently hospitalized 09/2019 for chest pain that was determined to be musculoskeletal.  She was seen by Dr. Johney Frame in 05/2021 and expressed sensation of fullness when lying  down.  She has occasional leg edema that is relieved with increased Lasix.  She was last seen by Christen Bame, NP on 12/15/2021 for follow-up visit.  During visit patient reported intermittent chest discomfort with fullness that did not indicate further ischemic evaluation.  Her blood pressure was well-controlled during visit and no changes were made to her current treatment plan  Ms. Burtch presents today for complaint of chest pain with tachycardia with her daughter.  Since last being seen in the office patient reports that she experienced the symptoms after finding out that her son had passed away in 03-01-2023.  She reports that her symptoms have not reoccurred and have resolved after he was buried on 2 December.  She notes that her primary care doctor has increased her Cymbalta and attempt to decrease her anxiety and possible depression.  Her blood pressure today was well-controlled at 126/68 and heart rate was 79 bpm.  She is currently tolerating her current medication regimen without any adverse effects.  She reports that she has not experiencing any dizziness or shortness of breath with the above mention tachycardia and chest pain.  She is currently talking with her pastor in the for support and guidance in this difficult time..  Patient denies chest pain, palpitations, dyspnea, PND, orthopnea, nausea, vomiting, dizziness, syncope, edema, weight gain, or early satiety.  Home MedicationsShe    Current Outpatient Medications  Medication Sig Dispense Refill   acetaminophen (TYLENOL) 500 MG tablet Take 500 mg by mouth every 6 (six) hours as needed for moderate pain.     amLODipine (NORVASC) 5 MG tablet Take 5 mg by mouth daily.     ASPIRIN LOW DOSE 81 MG tablet TAKE 1 TABLET (81 MG) BY MOUTH DAILY 90 tablet 3   atorvastatin (LIPITOR) 10 MG tablet TAKE 1 TABLET BY MOUTH EVERY DAY 90  tablet 1   b complex vitamins tablet Take 1 tablet by mouth daily.      cholecalciferol (VITAMIN D3) 25 MCG (1000 UNIT)  tablet Take 1,000 Units by mouth daily.     cycloSPORINE (RESTASIS) 0.05 % ophthalmic emulsion Place 1 drop into both eyes 2 (two) times daily.     DULoxetine (CYMBALTA) 30 MG capsule TAKE 1 CAPSULE BY MOUTH EVERY DAY 90 capsule 3   ferrous sulfate 220 (44 Fe) MG/5ML solution TAKE 7.4 MLS (325 MG TOTAL) BY MOUTH DAILY. 150 mL 11   fluticasone (FLONASE) 50 MCG/ACT nasal spray SPRAY 2 SPRAYS INTO EACH NOSTRIL EVERY DAY 48 mL 2   furosemide (LASIX) 40 MG tablet TAKE 0.5-1 TABLETS (20-40 MG TOTAL) BY MOUTH SEE ADMIN INSTRUCTIONS. TAKE 20 MG DAILY, MAY INSTEAD TAKE 40 MG DAILY AS NEEDED FOR EDEMA 30 tablet 8   gabapentin (NEURONTIN) 300 MG capsule TAKE 1 CAPSULE BY MOUTH TWICE A DAY 180 capsule 1   gentamicin cream (GARAMYCIN) 0.1 % Apply 1 application topically daily as needed (dry skin). 30 g 1   hydrALAZINE (APRESOLINE) 50 MG tablet Take 1 tablet (50 mg total) by mouth 3 (three) times daily. 270 tablet 3   latanoprost (XALATAN) 0.005 % ophthalmic solution Place 1 drop into both eyes at bedtime.     levothyroxine (SYNTHROID) 100 MCG tablet Take 1 tablet (100 mcg total) by mouth daily. 90 tablet 1   loratadine (CLARITIN) 10 MG tablet Take 10 mg by mouth daily.     mometasone-formoterol (DULERA) 200-5 MCG/ACT AERO Inhale 2 puffs into the lungs 2 (two) times daily. 13 g 1   Multiple Vitamin (MULTIVITAMIN WITH MINERALS) TABS tablet Take 1 tablet by mouth daily.     Nutritional Supplements (FEEDING SUPPLEMENT, OSMOLITE 1.5 CAL,) LIQD Place 780 mLs into feeding tube See admin instructions. Take 780 ml over a 12 hour period per day 9pm-9am at a rate of 55     nystatin cream (MYCOSTATIN) Apply 1 application  topically 2 (two) times daily.     pantoprazole (PROTONIX) 40 MG tablet Take 1 tablet (40 mg total) by mouth 2 (two) times daily. 180 tablet 2   Polyethyl Glycol-Propyl Glycol (SYSTANE) 0.4-0.3 % GEL ophthalmic gel Place 1 application into both eyes 2 (two) times daily as needed (moisture).      polyethylene glycol powder (GLYCOLAX/MIRALAX) 17 GM/SCOOP powder Place 17 g into feeding tube every Monday, Wednesday, and Friday at 8 PM.     Probiotic Product (EQL PROBIOTIC COLON SUPPORT PO) Take 1 capsule by mouth daily.     senna-docusate (SENOKOT-S) 8.6-50 MG tablet Take 1 tablet by mouth at bedtime as needed for mild constipation.     Sodium Chloride-Sodium Bicarb (NETI POT SINUS WASH NA) Place 1 application  into the nose 2 (two) times daily.     Water For Irrigation, Sterile (FREE WATER) SOLN Place 180 mLs into feeding tube 3 (three) times daily.     White Petrolatum-Mineral Oil (SYSTANE NIGHTTIME) OINT Place 1 application into both eyes at bedtime.      doxycycline (VIBRA-TABS) 100 MG tablet TAKE 1 TABLET BY MOUTH EVERY DAY (Patient not taking: Reported on 03/16/2022) 28 tablet 1   nystatin (MYCOSTATIN) 100000 UNIT/ML suspension Take 5 mLs (500,000 Units total) by mouth 4 (four) times daily. (Patient not taking: Reported on 03/16/2022) 60 mL 0   No current facility-administered medications for this visit.     Review of Systems  Please see the history of present illness.    (+)  Shortness of breath   All other systems reviewed and are otherwise negative except as noted above.  Physical Exam    Wt Readings from Last 3 Encounters:  03/16/22 130 lb (59 kg)  01/17/22 129 lb 9.6 oz (58.8 kg)  12/15/21 126 lb 6.4 oz (57.3 kg)   VS: Vitals:   03/16/22 1455  BP: 128/68  Pulse: 79  ,Body mass index is 20.98 kg/m.  Constitutional:      Appearance: Healthy appearance. Not in distress.  Neck:     Vascular: JVD normal.  Pulmonary:     Effort: Pulmonary effort is normal.     Breath sounds: No wheezing. No rales. Diminished in the bases Cardiovascular:     Normal rate. Regular rhythm. Normal S1. Normal S2.      Murmurs: There is no murmur.  Edema:    Peripheral edema absent.  Abdominal:     Palpations: Abdomen is soft non tender. There is no hepatomegaly.  Skin:    General:  Skin is warm and dry.  Neurological:     General: No focal deficit present.     Mental Status: Alert and oriented to person, place and time.     Cranial Nerves: Cranial nerves are intact.  EKG/LABS/Other Studies Reviewed    ECG personally reviewed by me today -atrial fibrillation with rate of 79 bpm with right axis deviation and no acute changes consistent with previous EKG.  Risk Assessment/Calculations:    CHA2DS2-VASc Score = 9   This indicates a 12.2% annual risk of stroke. The patient's score is based upon: CHF History: 1 HTN History: 1 Diabetes History: 1 Stroke History: 2 Vascular Disease History: 1 Age Score: 2 Gender Score: 1           Lab Results  Component Value Date   WBC 4.6 12/12/2021   HGB 9.2 (L) 12/12/2021   HCT 28.0 (L) 12/12/2021   MCV 96.9 12/12/2021   PLT 238 12/12/2021   Lab Results  Component Value Date   CREATININE 1.46 (H) 12/12/2021   BUN 58 (H) 12/12/2021   NA 138 12/12/2021   K 4.9 12/12/2021   CL 104 12/12/2021   CO2 25 12/12/2021   Lab Results  Component Value Date   ALT 16 12/12/2021   AST 21 12/12/2021   ALKPHOS 70 08/30/2021   BILITOT 0.3 12/12/2021   Lab Results  Component Value Date   CHOL 96 12/12/2021   HDL 47 (L) 12/12/2021   LDLCALC 28 12/12/2021   TRIG 126 12/12/2021   CHOLHDL 2.0 12/12/2021    Lab Results  Component Value Date   HGBA1C 6.0 (H) 12/12/2021    Assessment & Plan    1.  Chest pain: -[place] presents today with complaint of chest pain that occurred following the death of her son.  She reports today that her chest pain has not recurred since he was buried on 03/04/2022. -There is no indication to pursue any further ischemic evaluation at this time  2.  Paroxysmal atrial fibrillation: -Patient was in rate controlled A-fib and is currently not on beta-blockers due to syncope. -CHA2DS2-VASc Score = 9 [CHF History: 1, HTN History: 1, Diabetes History: 1, Stroke History: 2, Vascular Disease History: 1,  Age Score: 2, Gender Score: 1].  Therefore, the patient's annual risk of stroke is 12.2 %.      3.  Essential hypertension: -Patient's blood pressure today was well-controlled at 120/68 -Continue current antihypertensive regimen.  4.  History of GI bleed: -Patient  reports no recurrence and is currently off warfarin and Plavix  Disposition: Follow-up with as scheduled   Medication Adjustments/Labs and Tests Ordered: Current medicines are reviewed at length with the patient today.  Concerns regarding medicines are outlined above.   Signed, Mable Fill, Marissa Nestle, NP 03/16/2022, 4:40 PM [facility]  Note:  This document was prepared using Dragon voice recognition software and may include unintentional dictation errors.

## 2022-03-16 ENCOUNTER — Ambulatory Visit: Payer: Medicare Other | Attending: Nurse Practitioner | Admitting: Nurse Practitioner

## 2022-03-16 ENCOUNTER — Encounter: Payer: Self-pay | Admitting: Nurse Practitioner

## 2022-03-16 VITALS — BP 128/68 | HR 79 | Ht 66.0 in | Wt 130.0 lb

## 2022-03-16 DIAGNOSIS — I10 Essential (primary) hypertension: Secondary | ICD-10-CM | POA: Diagnosis not present

## 2022-03-16 DIAGNOSIS — I2510 Atherosclerotic heart disease of native coronary artery without angina pectoris: Secondary | ICD-10-CM | POA: Insufficient documentation

## 2022-03-16 DIAGNOSIS — K922 Gastrointestinal hemorrhage, unspecified: Secondary | ICD-10-CM | POA: Insufficient documentation

## 2022-03-16 DIAGNOSIS — I5032 Chronic diastolic (congestive) heart failure: Secondary | ICD-10-CM | POA: Diagnosis not present

## 2022-03-16 DIAGNOSIS — R079 Chest pain, unspecified: Secondary | ICD-10-CM | POA: Insufficient documentation

## 2022-03-16 NOTE — Patient Instructions (Signed)
Medication Instructions:  Your physician recommends that you continue on your current medications as directed. Please refer to the Current Medication list given to you today. *If you need a refill on your cardiac medications before your next appointment, please call your pharmacy*  Lab Work: None ordered  Testing/Procedures: None ordered  Follow-Up: At [facility], you and your health needs are our priority.  As part of our continuing mission to provide you with exceptional heart care, we have created designated Provider Care Teams.  These Care Teams include your primary Cardiologist (physician) and Advanced Practice Providers (APPs -  Physician Assistants and Nurse Practitioners) who all work together to provide you with the care you need, when you need it.  We recommend signing up for the patient portal called "MyChart".  Sign up information is provided on this After Visit Summary.  MyChart is used to connect with patients for Virtual Visits (Telemedicine).  Patients are able to view lab/test results, encounter notes, upcoming appointments, etc.  Non-urgent messages can be sent to your provider as well.   To learn more about what you can do with MyChart, go to [email or web address].    Your next appointment:   FOLLOW UP AS SCHEDULED   The format for your next appointment:   In Person  Provider:   Gwyndolyn Kaufman, MD  Other Instructions   Important Information About Sugar

## 2022-03-30 ENCOUNTER — Telehealth: Payer: Self-pay | Admitting: *Deleted

## 2022-03-30 DIAGNOSIS — F331 Major depressive disorder, recurrent, moderate: Secondary | ICD-10-CM

## 2022-03-30 DIAGNOSIS — E1142 Type 2 diabetes mellitus with diabetic polyneuropathy: Secondary | ICD-10-CM

## 2022-03-30 MED ORDER — DULOXETINE HCL 60 MG PO CPEP: 60.0000 mg | ORAL_CAPSULE | Freq: Every day | ORAL | 1 refills | Status: DC

## 2022-03-30 NOTE — Telephone Encounter (Signed)
Yes okay to continue, new Rx sent to pharmacy

## 2022-03-30 NOTE — Telephone Encounter (Signed)
Shannon [name] Notified and agreed.

## 2022-03-30 NOTE — Telephone Encounter (Signed)
Shannon [name], daughter called and wanted to know if you would like patient to continue on Cymbalta '60mg'$ . Stated that it was increased due to Son's death. If so, she will need a Rx for it to be called into pharmacy.   Patient has an appointment 04/07/2022 for follow up.   Please Advise.

## 2022-04-07 ENCOUNTER — Ambulatory Visit: Payer: Medicare Other | Admitting: Nurse Practitioner

## 2022-04-10 ENCOUNTER — Ambulatory Visit (INDEPENDENT_AMBULATORY_CARE_PROVIDER_SITE_OTHER): Payer: Medicare Other | Admitting: Podiatry

## 2022-04-10 VITALS — BP 165/72 | HR 78

## 2022-04-10 DIAGNOSIS — L97512 Non-pressure chronic ulcer of other part of right foot with fat layer exposed: Secondary | ICD-10-CM | POA: Diagnosis not present

## 2022-04-10 NOTE — Progress Notes (Signed)
Chief Complaint  Patient presents with   Foot Ulcer    Right foot ulcer,    Subjective:  Patient presenting today for follow-up evaluation with her daughter of chronic wounds to bilateral feet.  For now we are continuing palliative care.  Patient states that the wounds are stable.  Presenting for palliative care.   Past Medical History:  Diagnosis Date   Acute respiratory failure ([place])    Anemia    previous blood transfusions   Arthritis    "all over"   Asthma    Atrial flutter (HCC)    Bradycardia    requiring previous d/c of BB and reduction of amiodarone   CAD (coronary artery disease)    nonobstructive per notes   Chronic diastolic CHF (congestive heart failure) ([place])    CKD (chronic kidney disease), stage III ([place])    Complication of blood transfusion    "got the wrong blood type at [facility] in ~ 2015; no adverse reaction that we are aware of"/daughter, [name] (01/27/2016)   COPD (chronic obstructive pulmonary disease) ([place])    Depression    "light case"   DVT (deep venous thrombosis) ([place]) 01/2016   a. LLE DVT 01/2016 - switched from Eliquis to Coumadin.   Dyspnea    with some activity   Gastric stenosis    a. s/p stomach tube   GERD (gastroesophageal reflux disease)    GI bleed    Headache    History of 2019 novel coronavirus disease (COVID-19)    History of blood transfusion    "several" (01/27/2016)   History of stomach ulcers    Hyperlipidemia    Hypertension    Hypothyroidism    On home oxygen therapy    "2L; 9PM til 9AM" (06/27/2017)   PAD (peripheral artery disease) (HCC)    a. prior gangrene, toe amputations, intervention.   PAF (paroxysmal atrial fibrillation) ([place])    Paraesophageal hernia    Perforated gastric ulcer ([place])    Pneumonia    "a few times" (06/27/2017)   Seasonal allergies    SIADH (syndrome of inappropriate ADH production) ([place])    [name] 01/10/2015   Small bowel obstruction ([place])    "I don't know how many" (01/11/2015)    Stroke ([place])    several- no residual   Type II diabetes mellitus ([place])    "related to prednisone use  for > 20 yr; once predinose stopped; no more DM RX" (01/27/2016)   UTI (urinary tract infection) 02/08/2016   Ventral hernia with bowel obstruction     LT foot 12/16/2021  RT foot 12/16/2021  Objective: Physical Exam General: The patient is alert and oriented x3 in no acute distress.  Dermatology: Wounds very stable today.  The right foot actually demonstrates some significant improvement.  There is no drainage to the right foot wounds.  Left foot wound first MTP does have some fibrotic tissue and drainage.  Vascular: Established PAD.  Monitored closely by vein and vascular.  Balloon angioplasty with vein and vascular.  Lower extremity angiography 12/24/2020  VAS [place] ABI W/WO TBI 01/25/2021 Right: Resting right ankle-brachial index indicates mild right lower  extremity arterial disease.  Left: Resting left ankle-brachial index indicates noncompressible left  lower extremity arteries. The left toe-brachial index is abnormal.   Neurological: Epicritic and protective threshold diminished bilaterally.   Musculoskeletal Exam: Status post TMA right foot.  Hallux valgus left with history of overlying ulcer and degenerative changes.  Second and third toe amputation left.  Assessment: 1. s/p transmetatarsal amputation right foot. DOS: 11/14/2019 2.  H/o 2nd and 3rd toe amputation left 3.  Chronic ulcers bilateral feet 4.  Chronic osteomyelitis bilateral feet  Plan of Care:  1. Patient was evaluated.   2.  Patient states that home health dressing changes have been discontinued.  Patient has been applying Iodosorb with Aquacel Ag to the feet daily.  Continue. 3.  Continue palliative care for now and routine debridement.   4.  Patient is on chronic doxycycline 50 mg daily.   5.  Return to clinic 6 weeks for conservative palliative care  Edrick Kins, DPM [facility]  Dr.  Edrick Kins, DPM    [address], [place] [address]                Office [phone]  Fax [phone]

## 2022-04-11 ENCOUNTER — Other Ambulatory Visit: Payer: Self-pay | Admitting: Nurse Practitioner

## 2022-04-14 ENCOUNTER — Other Ambulatory Visit: Payer: Self-pay | Admitting: Podiatry

## 2022-04-14 ENCOUNTER — Ambulatory Visit: Payer: Medicare Other | Admitting: Nurse Practitioner

## 2022-04-19 ENCOUNTER — Ambulatory Visit: Payer: Medicare Other | Admitting: Nurse Practitioner

## 2022-05-02 ENCOUNTER — Other Ambulatory Visit: Payer: Self-pay | Admitting: Nurse Practitioner

## 2022-05-02 DIAGNOSIS — K921 Melena: Secondary | ICD-10-CM

## 2022-05-08 ENCOUNTER — Ambulatory Visit: Payer: Federal, State, Local not specified - PPO | Admitting: Nurse Practitioner

## 2022-05-12 ENCOUNTER — Telehealth: Payer: Self-pay

## 2022-05-12 NOTE — Telephone Encounter (Signed)
How is she feeling? Is she able to wear a mask in the office? We can do video visit if they are able and do not feel up to coming into the office

## 2022-05-12 NOTE — Telephone Encounter (Signed)
Spoke with Shannon [name] and she said that patient seems to be feeling fine. She did opt to do video visit because it would be easier on the patient.

## 2022-05-12 NOTE — Telephone Encounter (Signed)
Patient's daughter [name] called about patinet's appointment on Monday. Patient was diagnosed with COVID on Feb. 3rd, she wants to know if it is ok for patient to still come in or does she need to reschedule.  Message sent to Sherrie Mustache, NP

## 2022-05-15 ENCOUNTER — Encounter: Payer: Self-pay | Admitting: Nurse Practitioner

## 2022-05-15 ENCOUNTER — Telehealth (INDEPENDENT_AMBULATORY_CARE_PROVIDER_SITE_OTHER): Payer: Medicare Other | Admitting: Nurse Practitioner

## 2022-05-15 VITALS — BP 129/63 | Ht 66.0 in | Wt 130.0 lb

## 2022-05-15 DIAGNOSIS — Z931 Gastrostomy status: Secondary | ICD-10-CM | POA: Diagnosis not present

## 2022-05-15 DIAGNOSIS — D638 Anemia in other chronic diseases classified elsewhere: Secondary | ICD-10-CM

## 2022-05-15 DIAGNOSIS — I5032 Chronic diastolic (congestive) heart failure: Secondary | ICD-10-CM

## 2022-05-15 DIAGNOSIS — I10 Essential (primary) hypertension: Secondary | ICD-10-CM

## 2022-05-15 DIAGNOSIS — E1142 Type 2 diabetes mellitus with diabetic polyneuropathy: Secondary | ICD-10-CM

## 2022-05-15 DIAGNOSIS — U071 COVID-19: Secondary | ICD-10-CM | POA: Diagnosis not present

## 2022-05-15 DIAGNOSIS — J4489 Other specified chronic obstructive pulmonary disease: Secondary | ICD-10-CM

## 2022-05-15 DIAGNOSIS — F331 Major depressive disorder, recurrent, moderate: Secondary | ICD-10-CM | POA: Diagnosis not present

## 2022-05-15 DIAGNOSIS — E039 Hypothyroidism, unspecified: Secondary | ICD-10-CM

## 2022-05-15 DIAGNOSIS — R1319 Other dysphagia: Secondary | ICD-10-CM

## 2022-05-15 DIAGNOSIS — N1831 Chronic kidney disease, stage 3a: Secondary | ICD-10-CM

## 2022-05-15 MED ORDER — ALBUTEROL SULFATE HFA 108 (90 BASE) MCG/ACT IN AERS: 2.0000 | INHALATION_SPRAY | Freq: Four times a day (QID) | RESPIRATORY_TRACT | 2 refills | Status: DC | PRN

## 2022-05-15 NOTE — Progress Notes (Unsigned)
This service is provided via telemedicine  No vital signs collected/recorded due to the encounter was a telemedicine visit.   Location of patient (ex: home, work):  home  Patient consents to a telephone visit:  yes  Location of the provider (ex: office, home):  [facility]  Names of all persons participating in the telemedicine service and their role in the encounter:  patient, daughter vivian, Earl Gala [facility], [name], NP, Pearla Dubonnet RN Time spent on call:  10 minutes

## 2022-05-15 NOTE — Progress Notes (Unsigned)
Careteam: Patient Care Team: [name], NP as PCP - General (Geriatric Medicine) Edrick Kins, DPM as Consulting Physician (Podiatry) Dorothy Spark, MD as Consulting Physician (Cardiology) [name], NP as Nurse Practitioner (Geriatric Medicine) Camillo Flaming, OD as Referring Physician (Optometry) Freada Bergeron, MD as Consulting Physician (Cardiology)  Advanced Directive information Does Patient Have a Medical Advance Directive?: Yes, Type of Advance Directive: [place], Does patient want to make changes to medical advance directive?: No - Patient declined  Allergies  Allergen Reactions   Penicillins Itching, Rash and Other (See Comments)    Tolerated amoxicillin, unasyn, zosyn & cephalosporins in the past. Did it involve swelling of the face/tongue/throat, SOB, or low BP? No Did it involve sudden or severe rash/hives, skin peeling, or any reaction on the inside of your mouth or nose? No Did you need to seek medical attention at a hospital or doctor's office? Unknown When did it last happen?      5+ years If all above answers are "NO", may proceed with cephalosporin use.     Chief Complaint  Patient presents with   Medical Management of Chronic Issues    Patient is here for a F/U for chronic conditions     HPI: Patient is a 89 y.o. female for routine follow up  Pt recently diagnosised with COVID so visit was done virtually  Reports she is hanging in there.  States she is coughing a lot and vomiting due to coughing so much.  She is taking tussin DM every 12 hours.  Reports she is having wheezing but daughter states she has not heard any wheezing.   COPD- continues on dulera   Has pain in her feet and legs at night- daughter questions neuropathy.   PEG- had episode of bleeding per Ms Boling, daughter did not witness this.  Not a large amount of blood.   She worries about her abdominal hernias  but nontender and soft.    Feels like GERD is worse. She is taking protonix twice daily.   Review of Systems:  Review of Systems  Constitutional:  Negative for chills, fever and weight loss.  HENT:  Negative for tinnitus.   Respiratory:  Positive for cough, sputum production and shortness of breath.   Cardiovascular:  Negative for chest pain, palpitations and leg swelling.  Gastrointestinal:  Negative for abdominal pain, constipation, diarrhea and heartburn.  Genitourinary:  Negative for dysuria, frequency and urgency.  Musculoskeletal:  Negative for back pain, falls, joint pain and myalgias.  Skin: Negative.   Neurological:  Negative for dizziness and headaches.  Psychiatric/Behavioral:  Negative for depression and memory loss. The patient is nervous/anxious. The patient does not have insomnia.     Past Medical History:  Diagnosis Date   Acute respiratory failure ([place])    Anemia    previous blood transfusions   Arthritis    "all over"   Asthma    Atrial flutter (HCC)    Bradycardia    requiring previous d/c of BB and reduction of amiodarone   CAD (coronary artery disease)    nonobstructive per notes   Chronic diastolic CHF (congestive heart failure) ([place])    CKD (chronic kidney disease), stage III ([place])    Complication of blood transfusion    "got the wrong blood type at [facility] in ~ 2015; no adverse reaction that we are aware of"/daughter, [name] (01/27/2016)   COPD (chronic obstructive pulmonary disease) ([place])  Depression    "light case"   DVT (deep venous thrombosis) ([place]) 01/2016   a. LLE DVT 01/2016 - switched from Eliquis to Coumadin.   Dyspnea    with some activity   Gastric stenosis    a. s/p stomach tube   GERD (gastroesophageal reflux disease)    GI bleed    Headache    History of 2019 novel coronavirus disease (COVID-19)    History of blood transfusion    "several" (01/27/2016)   History of stomach ulcers    Hyperlipidemia    Hypertension    Hypothyroidism    On home  oxygen therapy    "2L; 9PM til 9AM" (06/27/2017)   PAD (peripheral artery disease) (HCC)    a. prior gangrene, toe amputations, intervention.   PAF (paroxysmal atrial fibrillation) ([place])    Paraesophageal hernia    Perforated gastric ulcer ([place])    Pneumonia    "a few times" (06/27/2017)   Seasonal allergies    SIADH (syndrome of inappropriate ADH production) ([place])    [name] 01/10/2015   Small bowel obstruction ([place])    "I don't know how many" (01/11/2015)   Stroke ([place])    several- no residual   Type II diabetes mellitus ([place])    "related to prednisone use  for > 20 yr; once predinose stopped; no more DM RX" (01/27/2016)   UTI (urinary tract infection) 02/08/2016   Ventral hernia with bowel obstruction    Past Surgical History:  Procedure Laterality Date   ABDOMINAL AORTOGRAM N/A 09/21/2016   Procedure: Abdominal Aortogram;  Surgeon: Waynetta Sandy, MD;  Location: [facility];  Service: Cardiovascular;  Laterality: N/A;   ABDOMINAL AORTOGRAM W/LOWER EXTREMITY N/A 10/20/2020   Procedure: ABDOMINAL AORTOGRAM W/LOWER EXTREMITY;  Surgeon: Cherre Robins, MD;  Location: [place];  Service: Cardiovascular;  Laterality: N/A;   AMPUTATION Left 09/25/2016   Procedure: AMPUTATION DIGIT- LEFT 2ND AND 3RD TOES;  Surgeon: Rosetta Posner, MD;  Location: [facility];  Service: Vascular;  Laterality: Left;   AMPUTATION Right 05/03/2018   Procedure: AMPUTATION RIGHT GREAT TOE;  Surgeon: Rosetta Posner, MD;  Location: [facility];  Service: Vascular;  Laterality: Right;   CATARACT EXTRACTION W/ INTRAOCULAR LENS  IMPLANT, BILATERAL     CHOLECYSTECTOMY OPEN     COLECTOMY     ESOPHAGOGASTRODUODENOSCOPY N/A 01/19/2014   Procedure: ESOPHAGOGASTRODUODENOSCOPY (EGD);  Surgeon: Irene Shipper, MD;  Location: [facility];  Service: Endoscopy;  Laterality: N/A;   ESOPHAGOGASTRODUODENOSCOPY N/A 01/20/2014   Procedure: ESOPHAGOGASTRODUODENOSCOPY (EGD);  Surgeon: Irene Shipper, MD;  Location: [facility];  Service: Endoscopy;  Laterality: N/A;   ESOPHAGOGASTRODUODENOSCOPY N/A 03/19/2014   Procedure: ESOPHAGOGASTRODUODENOSCOPY (EGD);  Surgeon: Milus Banister, MD;  Location: [facility];  Service: Endoscopy;  Laterality: N/A;   ESOPHAGOGASTRODUODENOSCOPY N/A 07/08/2015   Procedure: ESOPHAGOGASTRODUODENOSCOPY (EGD);  Surgeon: Doran Stabler, MD;  Location: [facility] ENDOSCOPY;  Service: Endoscopy;  Laterality: N/A;   ESOPHAGOGASTRODUODENOSCOPY (EGD) WITH PROPOFOL N/A 09/15/2015   Procedure: ESOPHAGOGASTRODUODENOSCOPY (EGD) WITH PROPOFOL;  Surgeon: Manus Gunning, MD;  [facility]: [facility];  Service: Gastroenterology;  Laterality: N/A;   ESOPHAGOGASTRODUODENOSCOPY (EGD) WITH PROPOFOL N/A 10/12/2020   Procedure: ESOPHAGOGASTRODUODENOSCOPY (EGD) WITH PROPOFOL;  Surgeon: Gatha Mayer, MD;  Location: [facility] [facility];  Service: Endoscopy;  Laterality: N/A;   FOOT SURGERY     GASTROJEJUNOSTOMY     hx/notes 01/10/2015   GASTROJEJUNOSTOMY N/A 09/23/2015   Procedure: OPEN GASTROJEJUNOSTOMY TUBE PLACEMENT;  Surgeon: Lurena Joiner  Sondra Come, MD;  Location: [facility];  Service: General;  Laterality: N/A;   GLAUCOMA SURGERY Bilateral    HEMATOMA EVACUATION Right 10/20/2020   Procedure: EVACUATION HEMATOMA RIGHT GROIN;  Surgeon: Cherre Robins, MD;  Location: [facility];  Service: Vascular;  Laterality: Right;   IR CM INJ ANY COLONIC TUBE W/FLUORO  01/05/2017   IR CM INJ ANY COLONIC TUBE W/FLUORO  06/07/2017   IR CM INJ ANY COLONIC TUBE W/FLUORO  11/05/2017   IR CM INJ ANY COLONIC TUBE W/FLUORO  09/18/2018   IR CM INJ ANY COLONIC TUBE W/FLUORO  03/06/2019   IR CM INJ ANY COLONIC TUBE W/FLUORO  03/11/2019   IR GASTR TUBE CONVERT GASTR-JEJ PER W/FL MOD SED  04/30/2020   IR GENERIC HISTORICAL  01/07/2016   IR GJ TUBE CHANGE 01/07/2016 Jacqulynn Cadet, MD [facility]   IR GENERIC HISTORICAL  01/27/2016   IR MECH REMOV OBSTRUC MAT ANY COLON TUBE W/FLUORO 01/27/2016 Markus Daft, MD [facility]   IR  GENERIC HISTORICAL  02/07/2016   IR PATIENT EVAL TECH 0-60 MINS Darrell K Allred, PA-C [facility]   IR GENERIC HISTORICAL  02/08/2016   IR GJ TUBE CHANGE 02/08/2016 Greggory Keen, MD [facility]   IR GENERIC HISTORICAL  01/06/2016   IR GJ TUBE CHANGE 01/06/2016 [facility] OUT REF   IR GENERIC HISTORICAL  05/02/2016   IR CM INJ ANY COLONIC TUBE W/FLUORO 05/02/2016 Arne Cleveland, MD [facility]   IR GENERIC HISTORICAL  05/15/2016   IR GJ TUBE CHANGE 05/15/2016 Sandi Mariscal, MD [facility]   IR GENERIC HISTORICAL  06/28/2016   IR GJ TUBE CHANGE 06/28/2016 [facility]   IR GJ TUBE CHANGE  02/20/2017   IR GJ TUBE CHANGE  05/10/2017   IR GJ TUBE CHANGE  07/06/2017   IR GJ TUBE CHANGE  08/02/2017   IR GJ TUBE CHANGE  10/15/2017   IR GJ TUBE CHANGE  12/26/2017   IR GJ TUBE CHANGE  04/08/2018   IR GJ TUBE CHANGE  06/19/2018   IR GJ TUBE CHANGE  03/03/2019   IR GJ TUBE CHANGE  06/06/2019   IR GJ TUBE CHANGE  09/26/2019   IR GJ TUBE CHANGE  01/09/2020   IR GJ TUBE CHANGE  05/12/2020   IR GJ TUBE CHANGE  10/05/2020   IR GJ TUBE CHANGE  10/14/2020   IR GJ TUBE CHANGE  10/19/2020   IR GJ TUBE CHANGE  02/25/2021   IR GJ TUBE CHANGE  03/23/2021   IR GJ TUBE CHANGE  08/29/2021   IR GJ TUBE CHANGE  08/30/2021   IR GJ TUBE CHANGE  09/05/2021   IR GJ TUBE CHANGE  01/26/2022   IR PATIENT EVAL TECH 0-60 MINS  10/19/2016   IR PATIENT EVAL TECH 0-60 MINS  12/25/2016   IR PATIENT EVAL TECH 0-60 MINS  01/29/2017   IR PATIENT EVAL TECH 0-60 MINS  04/04/2017   IR PATIENT EVAL TECH 0-60 MINS  04/30/2017   IR PATIENT EVAL TECH 0-60 MINS  07/31/2017   IR REPLC DUODEN/JEJUNO TUBE PERCUT W/FLUORO  11/14/2016   IR REPLC GASTRO/COLONIC TUBE PERCUT W/FLUORO  05/31/2021   IRRIGATION AND DEBRIDEMENT FOOT Bilateral 12/26/2020   Procedure: IRRIGATION AND DEBRIDEMENT FOOT AND BONE BIOPSY;  Surgeon: Trula Slade, DPM;  Location: [facility];  Service: Podiatry;  Laterality: Bilateral;   LAPAROTOMY N/A 01/20/2015    Procedure: EXPLORATORY LAPAROTOMY;  Surgeon: Coralie Keens, MD;  Location: [facility];  Service: General;  Laterality:  N/A;   LOWER EXTREMITY ANGIOGRAPHY Left 09/21/2016   Procedure: Lower Extremity Angiography;  Surgeon: Waynetta Sandy, MD;  Location: [facility];  Service: Cardiovascular;  Laterality: Left;   LOWER EXTREMITY ANGIOGRAPHY Right 06/27/2017   Procedure: Lower Extremity Angiography;  Surgeon: Waynetta Sandy, MD;  Location: [facility];  Service: Cardiovascular;  Laterality: Right;   LOWER EXTREMITY ANGIOGRAPHY Right 12/24/2020   Procedure: LOWER EXTREMITY ANGIOGRAPHY;  Surgeon: Cherre Robins, MD;  Location: [facility];  Service: Cardiovascular;  Laterality: Right;   LYSIS OF ADHESION N/A 01/20/2015   Procedure: LYSIS OF ADHESIONS < 1 HOUR;  Surgeon: Coralie Keens, MD;  Location: [place];  Service: General;  Laterality: N/A;   PERIPHERAL VASCULAR BALLOON ANGIOPLASTY Left 09/21/2016   Procedure: Peripheral Vascular Balloon Angioplasty;  Surgeon: Waynetta Sandy, MD;  Location: [facility];  Service: Cardiovascular;  Laterality: Left;  drug coated balloon   PERIPHERAL VASCULAR BALLOON ANGIOPLASTY Right 06/27/2017   Procedure: PERIPHERAL VASCULAR BALLOON ANGIOPLASTY;  Surgeon: Waynetta Sandy, MD;  Location: [facility];  Service: Cardiovascular;  Laterality: Right;  SFA/[id number]   PERIPHERAL VASCULAR INTERVENTION Left 10/20/2020   Procedure: PERIPHERAL VASCULAR INTERVENTION;  Surgeon: Cherre Robins, MD;  Location: [place] [facility];  Service: Cardiovascular;  Laterality: Left;   TONSILLECTOMY     TRANSMETATARSAL AMPUTATION Right 11/14/2019   Procedure: TRANSMETATARSAL AMPUTATION RIGHT FOOT;  Surgeon: Edrick Kins, DPM;  Location: [facility];  Service: Podiatry;  Laterality: Right;   TUBAL LIGATION     VENTRAL HERNIA REPAIR  2015   incarcerated ventral hernia ([facility] 09/2013)/notes 01/10/2015   WOUND EXPLORATION  Right 10/20/2020   Procedure: PRIMARY CLOSURE COMMON FEMORAL ARTERY;  Surgeon: Cherre Robins, MD;  Location: [facility];  Service: Vascular;  Laterality: Right;   Social History:   reports that she has never smoked. She has never been exposed to tobacco smoke. She quit smokeless tobacco use about 42 years ago.  Her smokeless tobacco use included snuff. She reports that she does not drink alcohol and does not use drugs.  Family History  Problem Relation Age of Onset   Stroke Mother    Hypertension Mother    Diabetes Brother    Glaucoma Son    Glaucoma Son    Heart attack Neg Hx    Colon cancer Neg Hx    Stomach cancer Neg Hx     Medications: Patient's Medications  New Prescriptions   No medications on file  Previous Medications   ACETAMINOPHEN (TYLENOL) 500 MG TABLET    Take 500 mg by mouth every 6 (six) hours as needed for moderate pain.   AMLODIPINE (NORVASC) 5 MG TABLET    TAKE 1 TABLET BY MOUTH EVERY DAY   ASPIRIN LOW DOSE 81 MG TABLET    TAKE 1 TABLET (81 MG) BY MOUTH DAILY   ATORVASTATIN (LIPITOR) 10 MG TABLET    TAKE 1 TABLET BY MOUTH EVERY DAY   B COMPLEX VITAMINS TABLET    Take 1 tablet by mouth daily.    CHOLECALCIFEROL (VITAMIN D3) 25 MCG (1000 UNIT) TABLET    Take 1,000 Units by mouth daily.   CYCLOSPORINE (RESTASIS) 0.05 % OPHTHALMIC EMULSION    Place 1 drop into both eyes 2 (two) times daily.   DOXYCYCLINE (VIBRA-TABS) 100 MG TABLET    TAKE 1 TABLET BY MOUTH EVERY DAY   DULOXETINE (CYMBALTA) 60 MG CAPSULE    Take 1 capsule (60  mg total) by mouth daily.   FERROUS SULFATE 220 (44 FE) MG/5ML SOLUTION    TAKE 7.4 MLS (325 MG TOTAL) BY MOUTH DAILY.   FLUTICASONE (FLONASE) 50 MCG/ACT NASAL SPRAY    SPRAY 2 SPRAYS INTO EACH NOSTRIL EVERY DAY   FUROSEMIDE (LASIX) 40 MG TABLET    TAKE 0.5-1 TABLETS (20-40 MG TOTAL) BY MOUTH SEE ADMIN INSTRUCTIONS. TAKE 20 MG DAILY, MAY INSTEAD TAKE 40 MG DAILY AS NEEDED FOR EDEMA   GABAPENTIN (NEURONTIN) 300 MG CAPSULE    TAKE 1 CAPSULE BY MOUTH  TWICE A DAY   GENTAMICIN CREAM (GARAMYCIN) 0.1 %    Apply 1 application topically daily as needed (dry skin).   HYDRALAZINE (APRESOLINE) 50 MG TABLET    Take 1 tablet (50 mg total) by mouth 3 (three) times daily.   LATANOPROST (XALATAN) 0.005 % OPHTHALMIC SOLUTION    Place 1 drop into both eyes at bedtime.   LEVOTHYROXINE (SYNTHROID) 100 MCG TABLET    Take 1 tablet (100 mcg total) by mouth daily.   LORATADINE (CLARITIN) 10 MG TABLET    Take 10 mg by mouth daily.   MOMETASONE-FORMOTEROL (DULERA) 200-5 MCG/ACT AERO    Inhale 2 puffs into the lungs 2 (two) times daily.   MULTIPLE VITAMIN (MULTIVITAMIN WITH MINERALS) TABS TABLET    Take 1 tablet by mouth daily.   NUTRITIONAL SUPPLEMENTS (FEEDING SUPPLEMENT, OSMOLITE 1.5 CAL,) LIQD    Place 780 mLs into feeding tube See admin instructions. Take 780 ml over a 12 hour period per day 9pm-9am at a rate of 55   NYSTATIN (MYCOSTATIN) 100000 UNIT/ML SUSPENSION    Take 5 mLs (500,000 Units total) by mouth 4 (four) times daily.   NYSTATIN CREAM (MYCOSTATIN)    Apply 1 application  topically 2 (two) times daily.   PANTOPRAZOLE (PROTONIX) 40 MG TABLET    TAKE 1 TABLET BY MOUTH TWICE A DAY   POLYETHYL GLYCOL-PROPYL GLYCOL (SYSTANE) 0.4-0.3 % GEL OPHTHALMIC GEL    Place 1 application into both eyes 2 (two) times daily as needed (moisture).   POLYETHYLENE GLYCOL POWDER (GLYCOLAX/MIRALAX) 17 GM/SCOOP POWDER    Place 17 g into feeding tube every Monday, Wednesday, and Friday at 8 PM.   PROBIOTIC PRODUCT (EQL PROBIOTIC COLON SUPPORT PO)    Take 1 capsule by mouth daily.   SENNA-DOCUSATE (SENOKOT-S) 8.6-50 MG TABLET    Take 1 tablet by mouth at bedtime as needed for mild constipation.   SODIUM CHLORIDE-SODIUM BICARB (NETI POT SINUS WASH NA)    Place 1 application  into the nose 2 (two) times daily.   WATER FOR IRRIGATION, STERILE (FREE WATER) SOLN    Place 180 mLs into feeding tube 3 (three) times daily.   WHITE PETROLATUM-MINERAL OIL (SYSTANE NIGHTTIME) OINT    Place 1  application into both eyes at bedtime.   Modified Medications   No medications on file  Discontinued Medications   No medications on file    Physical Exam:  Vitals:   05/15/22 1409  BP: 129/63  Weight: 130 lb (59 kg)  Height: 5' 6"$  (1.676 m)   Body mass index is 20.98 kg/m. Wt Readings from Last 3 Encounters:  05/15/22 130 lb (59 kg)  03/16/22 130 lb (59 kg)  01/17/22 129 lb 9.6 oz (58.8 kg)    Physical Exam***  Labs reviewed: Basic Metabolic Panel: Recent Labs    08/30/21 0031 09/12/21 1358 12/12/21 1452 02/03/22 1127  NA 137 136 138  --   K 3.4*  5.0 4.9  --   CL 107 99 104  --   CO2 22 29 25  $ --   GLUCOSE 175* 109 101  --   BUN 45* 63* 58*  --   CREATININE 1.55* 1.43* 1.46*  --   CALCIUM 9.0 9.7 9.3  --   TSH  --   --  0.08* 0.57   Liver Function Tests: Recent Labs    08/28/21 2150 08/30/21 0031 09/12/21 1358 12/12/21 1452  AST 42* 38 21 21  ALT 19 20 16 16  $ ALKPHOS 77 70  --   --   BILITOT 0.6 0.5 0.3 0.3  PROT 7.1 6.4* 7.2 7.5  ALBUMIN 3.2* 2.8*  --   --    Recent Labs    08/28/21 2150  LIPASE 21   No results for input(s): "AMMONIA" in the last 8760 hours. CBC: Recent Labs    08/30/21 0031 09/12/21 1358 12/12/21 1452  WBC 3.3* 5.0 4.6  NEUTROABS 2.0 3,330 2,972  HGB 8.8* 9.3* 9.2*  HCT 26.2* 28.9* 28.0*  MCV 95.6 97.0 96.9  PLT 188 238 238   Lipid Panel: Recent Labs    12/12/21 1452  CHOL 96  HDL 47*  LDLCALC 28  TRIG 126  CHOLHDL 2.0   TSH: Recent Labs    12/12/21 1452 02/03/22 1127  TSH 0.08* 0.57   A1C: Lab Results  Component Value Date   HGBA1C 6.0 (H) 12/12/2021     Assessment/Plan There are no diagnoses linked to this encounter.  Next appt: *** Emma Birchler K. Harle Battiest  [facility] [phone]    Virtual Visit via ***  I connected with patient on 05/15/22 at  2:20 PM EST by *** and verified that I am speaking with the correct person using two  identifiers.  Location: Patient: *** Provider: ***   I discussed the limitations, risks, security and privacy concerns of performing an evaluation and management service by telephone and the availability of in person appointments. I also discussed with the patient that there may be a patient responsible charge related to this service. The patient expressed understanding and agreed to proceed.   I discussed the assessment and treatment plan with the patient. The patient was provided an opportunity to ask questions and all were answered. The patient agreed with the plan and demonstrated an understanding of the instructions.   The patient was advised to call back or seek an in-person evaluation if the symptoms worsen or if the condition fails to improve as anticipated.  I provided *** minutes of non-face-to-face time during this encounter.  [name]. Harle Battiest Avs printed and mailed

## 2022-05-22 ENCOUNTER — Ambulatory Visit (INDEPENDENT_AMBULATORY_CARE_PROVIDER_SITE_OTHER): Payer: Medicare Other | Admitting: Podiatry

## 2022-05-22 VITALS — BP 154/72 | HR 89

## 2022-05-22 DIAGNOSIS — L97512 Non-pressure chronic ulcer of other part of right foot with fat layer exposed: Secondary | ICD-10-CM | POA: Diagnosis not present

## 2022-05-22 DIAGNOSIS — E0859 Diabetes mellitus due to underlying condition with other circulatory complications: Secondary | ICD-10-CM | POA: Diagnosis not present

## 2022-05-22 NOTE — Progress Notes (Signed)
Chief Complaint  Patient presents with   Foot Ulcer    Bilateral ulcers,    Subjective:  Patient presenting today for follow-up evaluation with her daughter of chronic wounds to bilateral feet.  For now we are continuing palliative care.  Patient states that the wounds are stable.  Presenting for palliative care.  Patient states that she is not feeling well over the past few days.  Been associated to her feet or wounds.  She states that she has been experiencing sore throat as well.  Past Medical History:  Diagnosis Date   Acute respiratory failure ([place])    Anemia    previous blood transfusions   Arthritis    "all over"   Asthma    Atrial flutter (HCC)    Bradycardia    requiring previous d/c of BB and reduction of amiodarone   CAD (coronary artery disease)    nonobstructive per notes   Chronic diastolic CHF (congestive heart failure) ([place])    CKD (chronic kidney disease), stage III ([place])    Complication of blood transfusion    "got the wrong blood type at [facility] in ~ 2015; no adverse reaction that we are aware of"/daughter, Shannon [name] (01/27/2016)   COPD (chronic obstructive pulmonary disease) ([place])    Depression    "light case"   DVT (deep venous thrombosis) ([place]) 01/2016   a. LLE DVT 01/2016 - switched from Eliquis to Coumadin.   Dyspnea    with some activity   Gastric stenosis    a. s/p stomach tube   GERD (gastroesophageal reflux disease)    GI bleed    Headache    History of 2019 novel coronavirus disease (COVID-19)    History of blood transfusion    "several" (01/27/2016)   History of stomach ulcers    Hyperlipidemia    Hypertension    Hypothyroidism    On home oxygen therapy    "2L; 9PM til 9AM" (06/27/2017)   PAD (peripheral artery disease) (HCC)    a. prior gangrene, toe amputations, intervention.   PAF (paroxysmal atrial fibrillation) ([place])    Paraesophageal hernia    Perforated gastric ulcer ([place])    Pneumonia    "a few times" (06/27/2017)   Seasonal  allergies    SIADH (syndrome of inappropriate ADH production) ([place])    Shannon [name] 01/10/2015   Small bowel obstruction ([place])    "I don't know how many" (01/11/2015)   Stroke [facility])    several- no residual   Type II diabetes mellitus ([place])    "related to prednisone use  for > 20 yr; once predinose stopped; no more DM RX" (01/27/2016)   UTI (urinary tract infection) 02/08/2016   Ventral hernia with bowel obstruction     LT foot 12/16/2021  RT foot 12/16/2021   LT foot 05/22/2022  LT foot 05/22/2022  Objective: Physical Exam General: The patient is alert and oriented x3 in no acute distress.  Dermatology: Wounds very stable today.  The right foot actually demonstrates some significant improvement.  There is no drainage to the right foot wounds.  Left foot wound first MTP does have some fibrotic tissue and drainage.  Vascular: Established PAD.  Monitored closely by vein and vascular.  Balloon angioplasty with vein and vascular.  Lower extremity angiography 12/24/2020  VAS [place] ABI W/WO TBI 01/25/2021 Right: Resting right ankle-brachial index indicates mild right lower  extremity arterial disease.  Left: Resting left ankle-brachial index indicates noncompressible left  lower extremity arteries. The left  toe-brachial index is abnormal.   Neurological: Epicritic and protective threshold diminished bilaterally.   Musculoskeletal Exam: Status post TMA right foot.  Hallux valgus left with history of overlying ulcer and degenerative changes.  Second and third toe amputation left.  Assessment: 1. s/p transmetatarsal amputation right foot. DOS: 11/14/2019 2.  H/o 2nd and 3rd toe amputation left 3.  Chronic ulcers bilateral feet 4.  Chronic osteomyelitis bilateral feet  Plan of Care:  1. Patient was evaluated.   2.  Patient states that home health dressing changes have been discontinued.  Patient has been applying Iodosorb with Aquacel Ag to the feet daily.  Continue. 3.  Continue palliative  care for now and routine debridement.  Patient is uninterested in any surgical intervention. 4.  Patient is on chronic doxycycline 50 mg daily.  Currently the patient is experiencing some thrush and overall not feeling well.  Patient is concerned that it is perhaps coming from the doxycycline.  She may discontinue the oral antibiotics since the wounds appear very stable 5.  Return to clinic 6 weeks for conservative palliative care  Shannon [name], DPM [facility]  Dr. Edrick [name], DPM    [address], [place] [address]                Office [phone]  Fax [phone]

## 2022-05-23 ENCOUNTER — Ambulatory Visit (INDEPENDENT_AMBULATORY_CARE_PROVIDER_SITE_OTHER)
Admission: RE | Admit: 2022-05-23 | Discharge: 2022-05-23 | Disposition: A | Discharge status: Home / Self Care | Payer: Medicare Other | Source: Ambulatory Visit | Attending: Vascular Surgery | Admitting: Vascular Surgery

## 2022-05-23 ENCOUNTER — Ambulatory Visit (HOSPITAL_COMMUNITY)
Admission: RE | Admit: 2022-05-23 | Discharge: 2022-05-23 | Disposition: A | Discharge status: Home / Self Care | Payer: Medicare Other | Source: Ambulatory Visit | Attending: Vascular Surgery | Admitting: Vascular Surgery

## 2022-05-23 ENCOUNTER — Telehealth: Payer: Self-pay | Admitting: *Deleted

## 2022-05-23 ENCOUNTER — Ambulatory Visit (INDEPENDENT_AMBULATORY_CARE_PROVIDER_SITE_OTHER): Payer: Medicare Other | Admitting: Physician Assistant

## 2022-05-23 VITALS — BP 152/60 | HR 87 | Temp 98.7°F | Resp 20 | Ht 66.0 in | Wt 130.3 lb

## 2022-05-23 DIAGNOSIS — I739 Peripheral vascular disease, unspecified: Secondary | ICD-10-CM | POA: Diagnosis not present

## 2022-05-23 DIAGNOSIS — I70235 Atherosclerosis of native arteries of right leg with ulceration of other part of foot: Secondary | ICD-10-CM

## 2022-05-23 DIAGNOSIS — I70245 Atherosclerosis of native arteries of left leg with ulceration of other part of foot: Secondary | ICD-10-CM | POA: Diagnosis not present

## 2022-05-23 LAB — VAS US ABI WITH/WO TBI

## 2022-05-23 NOTE — H&P (View-Only) (Signed)
Office Note     CC:  follow up Requesting Provider:  [name], NP  HPI: Shannon [name] is a 89 y.o. (03-Oct-1923) female who presents for follow up of PAD. She has undergone intervention on bilateral lower extremities. She most recently is status post right lower extremity angiogram with second-order cannulation by Dr. Stanford Breed on December 24, 2020. This was diagnostic with no intervention indicated. She has single inline flow via the peroneal artery. She had previously undergone angiography in July of 2022 by Dr. Stanford Breed with treatment of left popliteal lesion. She then underwent irrigation and debridement of bilateral foot ulcers by Dr. Jacqualyn Posey on December 28, 2020. Previous right TMA. The left foot ulcer had since healed. She continues to be under the care of Dr. Amalia Hailey for her bilateral foot wounds. These wounds have been present on and off since 2022. She has had a recurrence of her left foot wound over past month. Patient reports daily pain in left leg and foot. This pain is keeping her awake at night. The right foot pain is intermittent. This is mostly at night and is a tingling/ pins and needles pain. She does have neuropathy. She does float her heels on a pillow at night. She does not ambulate outside but walks around the house a lot with her rolling walker.  She tries to remain as independent as she can.  The pt is on a statin for cholesterol management.  The pt is on a daily aspirin.   Other AC:  None The pt is on CCB for hypertension.   The pt is diabetic.   Tobacco hx:  Former snuff user, quit 1982  Past Medical History:  Diagnosis Date   Acute respiratory failure ([place])    Anemia    previous blood transfusions   Arthritis    "all over"   Asthma    Atrial flutter (HCC)    Bradycardia    requiring previous d/c of BB and reduction of amiodarone   CAD (coronary artery disease)    nonobstructive per notes   Chronic diastolic CHF (congestive heart failure) ([place])    CKD  (chronic kidney disease), stage III ([place])    Complication of blood transfusion    "got the wrong blood type at [facility] in ~ 2015; no adverse reaction that we are aware of"/daughter, [name] (01/27/2016)   COPD (chronic obstructive pulmonary disease) ([place])    Depression    "light case"   DVT (deep venous thrombosis) ([place]) 01/2016   a. LLE DVT 01/2016 - switched from Eliquis to Coumadin.   Dyspnea    with some activity   Gastric stenosis    a. s/p stomach tube   GERD (gastroesophageal reflux disease)    GI bleed    Headache    History of 2019 novel coronavirus disease (COVID-19)    History of blood transfusion    "several" (01/27/2016)   History of stomach ulcers    Hyperlipidemia    Hypertension    Hypothyroidism    On home oxygen therapy    "2L; 9PM til 9AM" (06/27/2017)   PAD (peripheral artery disease) (HCC)    a. prior gangrene, toe amputations, intervention.   PAF (paroxysmal atrial fibrillation) ([place])    Paraesophageal hernia    Perforated gastric ulcer ([place])    Pneumonia    "a few times" (06/27/2017)   Seasonal allergies    SIADH (syndrome of inappropriate ADH production) ([place])    [name] 01/10/2015   Small bowel  obstruction ([place])    "I don't know how many" (01/11/2015)   Stroke [facility])    several- no residual   Type II diabetes mellitus ([place])    "related to prednisone use  for > 20 yr; once predinose stopped; no more DM RX" (01/27/2016)   UTI (urinary tract infection) 02/08/2016   Ventral hernia with bowel obstruction     Past Surgical History:  Procedure Laterality Date   ABDOMINAL AORTOGRAM N/A 09/21/2016   Procedure: Abdominal Aortogram;  Surgeon: Waynetta Sandy, MD;  Location: [place] [facility];  Service: Cardiovascular;  Laterality: N/A;   ABDOMINAL AORTOGRAM W/LOWER EXTREMITY N/A 10/20/2020   Procedure: ABDOMINAL AORTOGRAM W/LOWER EXTREMITY;  Surgeon: Cherre Robins, MD;  Location: [place] [facility];  Service: Cardiovascular;  Laterality: N/A;    AMPUTATION Left 09/25/2016   Procedure: AMPUTATION DIGIT- LEFT 2ND AND 3RD TOES;  Surgeon: Rosetta Posner, MD;  Location: [place];  Service: Vascular;  Laterality: Left;   AMPUTATION Right 05/03/2018   Procedure: AMPUTATION RIGHT GREAT TOE;  Surgeon: Rosetta Posner, MD;  [facility]: [facility];  Service: Vascular;  Laterality: Right;   CATARACT EXTRACTION W/ INTRAOCULAR LENS  IMPLANT, BILATERAL     CHOLECYSTECTOMY OPEN     COLECTOMY     ESOPHAGOGASTRODUODENOSCOPY N/A 01/19/2014   Procedure: ESOPHAGOGASTRODUODENOSCOPY (EGD);  Surgeon: Irene Shipper, MD;  Location: [facility];  Service: Endoscopy;  Laterality: N/A;   ESOPHAGOGASTRODUODENOSCOPY N/A 01/20/2014   Procedure: ESOPHAGOGASTRODUODENOSCOPY (EGD);  Surgeon: Irene Shipper, MD;  Location: [facility];  Service: Endoscopy;  Laterality: N/A;   ESOPHAGOGASTRODUODENOSCOPY N/A 03/19/2014   Procedure: ESOPHAGOGASTRODUODENOSCOPY (EGD);  Surgeon: Milus Banister, MD;  Location: [facility];  Service: Endoscopy;  Laterality: N/A;   ESOPHAGOGASTRODUODENOSCOPY N/A 07/08/2015   Procedure: ESOPHAGOGASTRODUODENOSCOPY (EGD);  Surgeon: Doran Stabler, MD;  [facility];  Service: Endoscopy;  Laterality: N/A;   ESOPHAGOGASTRODUODENOSCOPY (EGD) WITH PROPOFOL N/A 09/15/2015   Procedure: ESOPHAGOGASTRODUODENOSCOPY (EGD) WITH PROPOFOL;  Surgeon: Manus Gunning, MD;  Location: [facility];  Service: Gastroenterology;  Laterality: N/A;   ESOPHAGOGASTRODUODENOSCOPY (EGD) WITH PROPOFOL N/A 10/12/2020   Procedure: ESOPHAGOGASTRODUODENOSCOPY (EGD) WITH PROPOFOL;  Surgeon: Gatha Mayer, MD;  Location: [place];  Service: Endoscopy;  Laterality: N/A;   FOOT SURGERY     GASTROJEJUNOSTOMY     hx/notes 01/10/2015   GASTROJEJUNOSTOMY N/A 09/23/2015   Procedure: OPEN GASTROJEJUNOSTOMY TUBE PLACEMENT;  Surgeon: Arta Bruce Kinsinger, MD;  [facility]: [facility];  Service: General;  Laterality: N/A;   GLAUCOMA SURGERY Bilateral    HEMATOMA EVACUATION Right  10/20/2020   Procedure: EVACUATION HEMATOMA RIGHT GROIN;  Surgeon: Cherre Robins, MD;  Location: [place];  Service: Vascular;  Laterality: Right;   IR CM INJ ANY COLONIC TUBE W/FLUORO  01/05/2017   IR CM INJ ANY COLONIC TUBE W/FLUORO  06/07/2017   IR CM INJ ANY COLONIC TUBE W/FLUORO  11/05/2017   IR CM INJ ANY COLONIC TUBE W/FLUORO  09/18/2018   IR CM INJ ANY COLONIC TUBE W/FLUORO  03/06/2019   IR CM INJ ANY COLONIC TUBE W/FLUORO  03/11/2019   IR GASTR TUBE CONVERT GASTR-JEJ PER W/FL MOD SED  04/30/2020   IR GENERIC HISTORICAL  01/07/2016   IR GJ TUBE CHANGE 01/07/2016 Jacqulynn Cadet, MD [facility]   IR GENERIC HISTORICAL  01/27/2016   IR MECH REMOV OBSTRUC MAT ANY COLON TUBE W/FLUORO 01/27/2016 Markus Daft, MD [facility]   IR GENERIC HISTORICAL  02/07/2016   IR PATIENT EVAL TECH 0-60  MINS Darrell K Allred, PA-C [facility]   IR GENERIC HISTORICAL  02/08/2016   IR GJ TUBE CHANGE 02/08/2016 Greggory Keen, MD [facility]   IR GENERIC HISTORICAL  01/06/2016   IR GJ TUBE CHANGE 01/06/2016 [facility] OUT REF   IR GENERIC HISTORICAL  05/02/2016   IR CM INJ ANY COLONIC TUBE W/FLUORO 05/02/2016 Arne Cleveland, MD [facility]   IR GENERIC HISTORICAL  05/15/2016   IR GJ TUBE CHANGE 05/15/2016 Sandi Mariscal, MD [facility]   IR GENERIC HISTORICAL  06/28/2016   IR GJ TUBE CHANGE 06/28/2016 [facility]   IR GJ TUBE CHANGE  02/20/2017   IR GJ TUBE CHANGE  05/10/2017   IR GJ TUBE CHANGE  07/06/2017   IR GJ TUBE CHANGE  08/02/2017   IR GJ TUBE CHANGE  10/15/2017   IR GJ TUBE CHANGE  12/26/2017   IR GJ TUBE CHANGE  04/08/2018   IR GJ TUBE CHANGE  06/19/2018   IR GJ TUBE CHANGE  03/03/2019   IR GJ TUBE CHANGE  06/06/2019   IR GJ TUBE CHANGE  09/26/2019   IR GJ TUBE CHANGE  01/09/2020   IR GJ TUBE CHANGE  05/12/2020   IR GJ TUBE CHANGE  10/05/2020   IR GJ TUBE CHANGE  10/14/2020   IR GJ TUBE CHANGE  10/19/2020   IR GJ TUBE CHANGE  02/25/2021   IR GJ TUBE CHANGE  03/23/2021   IR GJ TUBE  CHANGE  08/29/2021   IR GJ TUBE CHANGE  08/30/2021   IR GJ TUBE CHANGE  09/05/2021   IR GJ TUBE CHANGE  01/26/2022   IR PATIENT EVAL TECH 0-60 MINS  10/19/2016   IR PATIENT EVAL TECH 0-60 MINS  12/25/2016   IR PATIENT EVAL TECH 0-60 MINS  01/29/2017   IR PATIENT EVAL TECH 0-60 MINS  04/04/2017   IR PATIENT EVAL TECH 0-60 MINS  04/30/2017   IR PATIENT EVAL TECH 0-60 MINS  07/31/2017   IR REPLC DUODEN/JEJUNO TUBE PERCUT W/FLUORO  11/14/2016   IR REPLC GASTRO/COLONIC TUBE PERCUT W/FLUORO  05/31/2021   IRRIGATION AND DEBRIDEMENT FOOT Bilateral 12/26/2020   Procedure: IRRIGATION AND DEBRIDEMENT FOOT AND BONE BIOPSY;  Surgeon: Trula Slade, DPM;  Location: [place];  Service: Podiatry;  Laterality: Bilateral;   LAPAROTOMY N/A 01/20/2015   Procedure: EXPLORATORY LAPAROTOMY;  Surgeon: Coralie Keens, MD;  Location: [place];  Service: General;  Laterality: N/A;   LOWER EXTREMITY ANGIOGRAPHY Left 09/21/2016   Procedure: Lower Extremity Angiography;  Surgeon: Waynetta Sandy, MD;  Location: [place] [facility];  Service: Cardiovascular;  Laterality: Left;   LOWER EXTREMITY ANGIOGRAPHY Right 06/27/2017   Procedure: Lower Extremity Angiography;  Surgeon: Waynetta Sandy, MD;  Location: [place] [facility];  Service: Cardiovascular;  Laterality: Right;   LOWER EXTREMITY ANGIOGRAPHY Right 12/24/2020   Procedure: LOWER EXTREMITY ANGIOGRAPHY;  Surgeon: Cherre Robins, MD;  Location: [place] [facility];  Service: Cardiovascular;  Laterality: Right;   LYSIS OF ADHESION N/A 01/20/2015   Procedure: LYSIS OF ADHESIONS < 1 HOUR;  Surgeon: Coralie Keens, MD;  Location: [place];  Service: General;  Laterality: N/A;   PERIPHERAL VASCULAR BALLOON ANGIOPLASTY Left 09/21/2016   Procedure: Peripheral Vascular Balloon Angioplasty;  Surgeon: Waynetta Sandy, MD;  Location: [place] [facility];  Service: Cardiovascular;  Laterality: Left;  drug coated balloon   PERIPHERAL VASCULAR BALLOON  ANGIOPLASTY Right 06/27/2017   Procedure: PERIPHERAL VASCULAR BALLOON ANGIOPLASTY;  Surgeon: Waynetta Sandy, MD;  Location: [place] [facility];  Service: Cardiovascular;  Laterality: Right;  SFA/[id number]   PERIPHERAL VASCULAR INTERVENTION Left 10/20/2020   Procedure: PERIPHERAL VASCULAR INTERVENTION;  Surgeon: Cherre Robins, MD;  Location: [place] [facility];  Service: Cardiovascular;  Laterality: Left;   TONSILLECTOMY     TRANSMETATARSAL AMPUTATION Right 11/14/2019   Procedure: TRANSMETATARSAL AMPUTATION RIGHT FOOT;  Surgeon: Edrick Kins, DPM;  Location: [place];  Service: Podiatry;  Laterality: Right;   TUBAL LIGATION     VENTRAL HERNIA REPAIR  2015   incarcerated ventral hernia ([facility] 09/2013)/notes 01/10/2015   WOUND EXPLORATION Right 10/20/2020   Procedure: PRIMARY CLOSURE COMMON FEMORAL ARTERY;  Surgeon: Cherre Robins, MD;  Location: [facility];  Service: Vascular;  Laterality: Right;    Social History   Socioeconomic History   Marital status: Widowed    Spouse name: Not on file   Number of children: 3   Years of education: Not on file   Highest education level: Not on file  Occupational History   Not on file  Tobacco Use   Smoking status: Never    Passive exposure: Never   Smokeless tobacco: Former    Types: Snuff    Quit date: 04/03/1980   Tobacco comments:    "used snuff in my younger days"  Vaping Use   Vaping Use: Never used  Substance and Sexual Activity   Alcohol use: Never    Alcohol/week: 0.0 standard drinks of alcohol   Drug use: Never   Sexual activity: Not Currently  Other Topics Concern   Not on file  Social History Narrative   Married in 1947, lives in one story house with 2 people and no petsOccupation: ?Has a living Will, [place], and doesn't have DNRWas living with Husband (in frail health) in [place] until 09/2013.  After her [facility] admission they both came to live with daughter in [place] Strain: Low Risk  (10/05/2017)   Overall Financial Resource Strain (CARDIA)    Difficulty of Paying Living Expenses: Not hard at all  Food Insecurity: No Food Insecurity (08/11/2021)   Hunger Vital Sign    Worried About Running Out of Food in the Last Year: Never true    Ran Out of Food in the Last Year: Never true  Transportation Needs: No Transportation Needs (08/11/2021)   PRAPARE - Hydrologist (Medical): No    Lack of Transportation (Non-Medical): No  Physical Activity: Inactive (10/05/2017)   Exercise Vital Sign    Days of Exercise per Week: 0 days    Minutes of Exercise per Session: 0 min  Stress: No Stress Concern Present (10/05/2017)   [place]    Feeling of Stress : Not at all  Social Connections: Moderately Isolated (10/05/2017)   Social Connection and Isolation Panel [NHANES]    Frequency of Communication with Friends and Family: More than three times a week    Frequency of Social Gatherings with Friends and Family: More than three times a week    Attends Religious Services: Never    Marine scientist or Organizations: No    Attends Archivist Meetings: Never    Marital Status: Widowed  Intimate Partner Violence: Not At Risk (10/05/2017)   Humiliation, Afraid, Rape, and Kick questionnaire    Fear of Current or Ex-Partner: No    Emotionally Abused: No  Physically Abused: No    Sexually Abused: No    Family History  Problem Relation Age of Onset   Stroke Mother    Hypertension Mother    Diabetes Brother    Glaucoma Son    Glaucoma Son    Heart attack Neg Hx    Colon cancer Neg Hx    Stomach cancer Neg Hx     Current Outpatient Medications  Medication Sig Dispense Refill   acetaminophen (TYLENOL) 500 MG tablet Take 500 mg by mouth every 6 (six) hours as needed for moderate pain.     albuterol (VENTOLIN HFA) 108 (90 Base) MCG/ACT inhaler  Inhale 2 puffs into the lungs every 6 (six) hours as needed for wheezing or shortness of breath. 8 g 2   amLODipine (NORVASC) 5 MG tablet TAKE 1 TABLET BY MOUTH EVERY DAY 90 tablet 3   ASPIRIN LOW DOSE 81 MG tablet TAKE 1 TABLET (81 MG) BY MOUTH DAILY 90 tablet 3   atorvastatin (LIPITOR) 10 MG tablet TAKE 1 TABLET BY MOUTH EVERY DAY 90 tablet 1   b complex vitamins tablet Take 1 tablet by mouth daily.      cholecalciferol (VITAMIN D3) 25 MCG (1000 UNIT) tablet Take 1,000 Units by mouth daily.     cycloSPORINE (RESTASIS) 0.05 % ophthalmic emulsion Place 1 drop into both eyes 2 (two) times daily.     doxycycline (VIBRA-TABS) 100 MG tablet TAKE 1 TABLET BY MOUTH EVERY DAY 28 tablet 1   DULoxetine (CYMBALTA) 60 MG capsule Take 1 capsule (60 mg total) by mouth daily. 90 capsule 1   ferrous sulfate 220 (44 Fe) MG/5ML solution TAKE 7.4 MLS (325 MG TOTAL) BY MOUTH DAILY. 150 mL 11   fluticasone (FLONASE) 50 MCG/ACT nasal spray SPRAY 2 SPRAYS INTO EACH NOSTRIL EVERY DAY 48 mL 2   furosemide (LASIX) 40 MG tablet TAKE 0.5-1 TABLETS (20-40 MG TOTAL) BY MOUTH SEE ADMIN INSTRUCTIONS. TAKE 20 MG DAILY, MAY INSTEAD TAKE 40 MG DAILY AS NEEDED FOR EDEMA 30 tablet 8   gabapentin (NEURONTIN) 300 MG capsule TAKE 1 CAPSULE BY MOUTH TWICE A DAY 180 capsule 1   gentamicin cream (GARAMYCIN) 0.1 % Apply 1 application topically daily as needed (dry skin). 30 g 1   hydrALAZINE (APRESOLINE) 50 MG tablet Take 1 tablet (50 mg total) by mouth 3 (three) times daily. 270 tablet 3   latanoprost (XALATAN) 0.005 % ophthalmic solution Place 1 drop into both eyes at bedtime.     levothyroxine (SYNTHROID) 100 MCG tablet Take 1 tablet (100 mcg total) by mouth daily. 90 tablet 1   loratadine (CLARITIN) 10 MG tablet Take 10 mg by mouth daily.     mometasone-formoterol (DULERA) 200-5 MCG/ACT AERO Inhale 2 puffs into the lungs 2 (two) times daily. 13 g 1   Multiple Vitamin (MULTIVITAMIN WITH MINERALS) TABS tablet Take 1 tablet by mouth  daily.     Nutritional Supplements (FEEDING SUPPLEMENT, OSMOLITE 1.5 CAL,) LIQD Place 780 mLs into feeding tube See admin instructions. Take 780 ml over a 12 hour period per day 9pm-9am at a rate of 55     nystatin (MYCOSTATIN) 100000 UNIT/ML suspension Take 5 mLs (500,000 Units total) by mouth 4 (four) times daily. 60 mL 0   nystatin cream (MYCOSTATIN) Apply 1 application  topically 2 (two) times daily.     pantoprazole (PROTONIX) 40 MG tablet TAKE 1 TABLET BY MOUTH TWICE A DAY 180 tablet 2   Polyethyl Glycol-Propyl Glycol (SYSTANE) 0.4-0.3 % GEL  ophthalmic gel Place 1 application into both eyes 2 (two) times daily as needed (moisture).     polyethylene glycol powder (GLYCOLAX/MIRALAX) 17 GM/SCOOP powder Place 17 g into feeding tube every Monday, Wednesday, and Friday at 8 PM.     Probiotic Product (EQL PROBIOTIC COLON SUPPORT PO) Take 1 capsule by mouth daily.     senna-docusate (SENOKOT-S) 8.6-50 MG tablet Take 1 tablet by mouth at bedtime as needed for mild constipation.     Sodium Chloride-Sodium Bicarb (NETI POT SINUS WASH NA) Place 1 application  into the nose 2 (two) times daily.     Water For Irrigation, Sterile (FREE WATER) SOLN Place 180 mLs into feeding tube 3 (three) times daily.     White Petrolatum-Mineral Oil (SYSTANE NIGHTTIME) OINT Place 1 application into both eyes at bedtime.      No current facility-administered medications for this visit.    Allergies  Allergen Reactions   Penicillins Itching, Rash and Other (See Comments)    Tolerated amoxicillin, unasyn, zosyn & cephalosporins in the past. Did it involve swelling of the face/tongue/throat, SOB, or low BP? No Did it involve sudden or severe rash/hives, skin peeling, or any reaction on the inside of your mouth or nose? No Did you need to seek medical attention at a hospital or doctor's office? Unknown When did it last happen?      5+ years If all above answers are "NO", may proceed with cephalosporin use.       REVIEW OF SYSTEMS:  '[X]'$  denotes positive finding, '[ ]'$  denotes negative finding Cardiac  Comments:  Chest pain or chest pressure:    Shortness of breath upon exertion:    Short of breath when lying flat:    Irregular heart rhythm:        Vascular    Pain in calf, thigh, or hip brought on by ambulation:    Pain in feet at night that wakes you up from your sleep:     Blood clot in your veins:    Leg swelling:         Pulmonary    Oxygen at home:    Productive cough:     Wheezing:         Neurologic    Sudden weakness in arms or legs:     Sudden numbness in arms or legs:     Sudden onset of difficulty speaking or slurred speech:    Temporary loss of vision in one eye:     Problems with dizziness:         Gastrointestinal    Blood in stool:     Vomited blood:         Genitourinary    Burning when urinating:     Blood in urine:        Psychiatric    Major depression:         Hematologic    Bleeding problems:    Problems with blood clotting too easily:        Skin    Rashes or ulcers:        Constitutional    Fever or chills:      PHYSICAL EXAMINATION:  Vitals:   05/23/22 1217  BP: (!) 152/60  Pulse: 87  Resp: 20  Temp: 98.7 F (37.1 C)  TempSrc: Temporal  SpO2: 99%  Weight: 130 lb 4.8 oz (59.1 kg)  Height: '5\' 6"'$  (1.676 m)    General:  WDWN in NAD; vital signs documented  above Gait: uses wheel chair HENT: WNL, normocephalic Pulmonary: normal non-labored breathing , without wheezing Cardiac: regular HR, Vascular Exam/Pulses: Doppler left Dp/ PT/ Pero monophasic signals. Left Doppler DP and PT monophasic signals. Both feet warm and well perfused.  Extremities: with ischemic changes, without Gangrene , without cellulitis; with open wound off left medial great toe, medial aspect of left 3rd, and plantar distal aspect of right TMA    Musculoskeletal: no muscle wasting or atrophy  Neurologic: A&O X 3;  No focal weakness or paresthesias are  detected Psychiatric:  The pt has Normal affect.   Non-Invasive Vascular Imaging:   VAS [place] lower Extremity Arterial Duplex: Summary:  Right: Patent arterial system without evidence of hemodynamically significant stenosis.  Limited visualization due to circumferential calcification with shadowing.   Left: Patent arterial system with evidence of 30-49% stenosis in the CFA and 50-74% stenosis in the proximal SFA. Limited visualization due to circumferential calcification with shadowing.   ABI's are non compressible bilaterally. Unable to obtain TBI's due to wounds and prior toe amputations  ASSESSMENT/PLAN:: 89 y.o. female here for follow up for  PAD. New recurrent ulceration of left great toe. Now with what seems to be rest pain in left leg and foot. She is caring for wounds on both feet with Iodosorb. Right TMA ulcers stable. She has some pain in right leg. Not classically claudication or rest pain. She has known single vessel peroneal flow. I have recommended continued local wound care and hopefully this will resolve. Her ABI today is essentially unchanged. She has non compressible vessels with monophasic flow. Duplex today shows elevated velocities in the left SFA. Monophasic flow bilaterally with difficulty visualizing popliteal arteries and runoff.  - I have encouraged her to continue her activity as tolerated - Local wound care to both feet and follow up with Podiatrist - continue Aspirin and Statin - Due to new LLE ulceration and rest pain I have recommended repeat Angiography. I will arrange this in the near future with Dr. Stanford Breed.   Karoline Caldwell, PA-C [facility] [phone]  Clinic MD:   Roxanne Mins

## 2022-05-23 NOTE — Telephone Encounter (Signed)
Received fax from [facility] requesting order to be signed for patient's Osmolite 1.5 unflavored Supplement.   Placed order in [facility] folder to review and sign.  To be faxed back to [facility] Fax:[phone] once completed and signed.

## 2022-05-23 NOTE — Progress Notes (Signed)
Office Note     CC:  follow up Requesting Provider:  [name], NP  HPI: Shannon [name] is a 89 y.o. ([birth year]) female who presents for follow up of PAD. She has undergone intervention on bilateral lower extremities. She most recently is status post right lower extremity angiogram with second-order cannulation by Dr. Stanford Breed on December 24, 2020. This was diagnostic with no intervention indicated. She has single inline flow via the peroneal artery. She had previously undergone angiography in July of 2022 by Dr. Stanford Breed with treatment of left popliteal lesion. She then underwent irrigation and debridement of bilateral foot ulcers by Dr. Jacqualyn Posey on December 28, 2020. Previous right TMA. The left foot ulcer had since healed. She continues to be under the care of Dr. Amalia Hailey for her bilateral foot wounds. These wounds have been present on and off since 2022. She has had a recurrence of her left foot wound over past month. Patient reports daily pain in left leg and foot. This pain is keeping her awake at night. The right foot pain is intermittent. This is mostly at night and is a tingling/ pins and needles pain. She does have neuropathy. She does float her heels on a pillow at night. She does not ambulate outside but walks around the house a lot with her rolling walker.  She tries to remain as independent as she can.  The pt is on a statin for cholesterol management.  The pt is on a daily aspirin.   Other AC:  None The pt is on CCB for hypertension.   The pt is diabetic.   Tobacco hx:  Former snuff user, quit 1982  Past Medical History:  Diagnosis Date   Acute respiratory failure ([place])    Anemia    previous blood transfusions   Arthritis    "all over"   Asthma    Atrial flutter (HCC)    Bradycardia    requiring previous d/c of BB and reduction of amiodarone   CAD (coronary artery disease)    nonobstructive per notes   Chronic diastolic CHF (congestive heart failure) ([place])    CKD  (chronic kidney disease), stage III ([place])    Complication of blood transfusion    "got the wrong blood type at [facility] in ~ 2015; no adverse reaction that we are aware of"/daughter, [name] (01/27/2016)   COPD (chronic obstructive pulmonary disease) ([place])    Depression    "light case"   DVT (deep venous thrombosis) ([place]) 01/2016   a. LLE DVT 01/2016 - switched from Eliquis to Coumadin.   Dyspnea    with some activity   Gastric stenosis    a. s/p stomach tube   GERD (gastroesophageal reflux disease)    GI bleed    Headache    History of 2019 novel coronavirus disease (COVID-19)    History of blood transfusion    "several" (01/27/2016)   History of stomach ulcers    Hyperlipidemia    Hypertension    Hypothyroidism    On home oxygen therapy    "2L; 9PM til 9AM" (06/27/2017)   PAD (peripheral artery disease) (HCC)    a. prior gangrene, toe amputations, intervention.   PAF (paroxysmal atrial fibrillation) ([place])    Paraesophageal hernia    Perforated gastric ulcer ([place])    Pneumonia    "a few times" (06/27/2017)   Seasonal allergies    SIADH (syndrome of inappropriate ADH production) ([place])    [name] 01/10/2015   Small bowel  obstruction ([place])    "I don't know how many" (01/11/2015)   Stroke [facility])    several- no residual   Type II diabetes mellitus ([place])    "related to prednisone use  for > 20 yr; once predinose stopped; no more DM RX" (01/27/2016)   UTI (urinary tract infection) 02/08/2016   Ventral hernia with bowel obstruction     Past Surgical History:  Procedure Laterality Date   ABDOMINAL AORTOGRAM N/A 09/21/2016   Procedure: Abdominal Aortogram;  Surgeon: Waynetta Sandy, MD;  Location: [facility];  Service: Cardiovascular;  Laterality: N/A;   ABDOMINAL AORTOGRAM W/LOWER EXTREMITY N/A 10/20/2020   Procedure: ABDOMINAL AORTOGRAM W/LOWER EXTREMITY;  Surgeon: Cherre Robins, MD;  Location: [place] [facility];  Service: Cardiovascular;  Laterality: N/A;    AMPUTATION Left 09/25/2016   Procedure: AMPUTATION DIGIT- LEFT 2ND AND 3RD TOES;  Surgeon: Rosetta Posner, MD;  Location: [place];  Service: Vascular;  Laterality: Left;   AMPUTATION Right 05/03/2018   Procedure: AMPUTATION RIGHT GREAT TOE;  Surgeon: Rosetta Posner, MD;  Location: [facility];  Service: Vascular;  Laterality: Right;   CATARACT EXTRACTION W/ INTRAOCULAR LENS  IMPLANT, BILATERAL     CHOLECYSTECTOMY OPEN     COLECTOMY     ESOPHAGOGASTRODUODENOSCOPY N/A 01/19/2014   Procedure: ESOPHAGOGASTRODUODENOSCOPY (EGD);  Surgeon: Irene Shipper, MD;  Location: [facility];  Service: Endoscopy;  Laterality: N/A;   ESOPHAGOGASTRODUODENOSCOPY N/A 01/20/2014   Procedure: ESOPHAGOGASTRODUODENOSCOPY (EGD);  Surgeon: Irene Shipper, MD;  Location: [facility];  Service: Endoscopy;  Laterality: N/A;   ESOPHAGOGASTRODUODENOSCOPY N/A 03/19/2014   Procedure: ESOPHAGOGASTRODUODENOSCOPY (EGD);  Surgeon: Milus Banister, MD;  Location: [facility];  Service: Endoscopy;  Laterality: N/A;   ESOPHAGOGASTRODUODENOSCOPY N/A 07/08/2015   Procedure: ESOPHAGOGASTRODUODENOSCOPY (EGD);  Surgeon: Doran Stabler, MD;  [facility];  Service: Endoscopy;  Laterality: N/A;   ESOPHAGOGASTRODUODENOSCOPY (EGD) WITH PROPOFOL N/A 09/15/2015   Procedure: ESOPHAGOGASTRODUODENOSCOPY (EGD) WITH PROPOFOL;  Surgeon: Manus Gunning, MD;  [facility]: [facility];  Service: Gastroenterology;  Laterality: N/A;   ESOPHAGOGASTRODUODENOSCOPY (EGD) WITH PROPOFOL N/A 10/12/2020   Procedure: ESOPHAGOGASTRODUODENOSCOPY (EGD) WITH PROPOFOL;  Surgeon: Gatha Mayer, MD;  Location: [facility];  Service: Endoscopy;  Laterality: N/A;   FOOT SURGERY     GASTROJEJUNOSTOMY     hx/notes 01/10/2015   GASTROJEJUNOSTOMY N/A 09/23/2015   Procedure: OPEN GASTROJEJUNOSTOMY TUBE PLACEMENT;  Surgeon: Arta Bruce Kinsinger, MD;  [facility]: [facility];  Service: General;  Laterality: N/A;   GLAUCOMA SURGERY Bilateral    HEMATOMA EVACUATION Right  10/20/2020   Procedure: EVACUATION HEMATOMA RIGHT GROIN;  Surgeon: Cherre Robins, MD;  Location: [facility];  Service: Vascular;  Laterality: Right;   IR CM INJ ANY COLONIC TUBE W/FLUORO  01/05/2017   IR CM INJ ANY COLONIC TUBE W/FLUORO  06/07/2017   IR CM INJ ANY COLONIC TUBE W/FLUORO  11/05/2017   IR CM INJ ANY COLONIC TUBE W/FLUORO  09/18/2018   IR CM INJ ANY COLONIC TUBE W/FLUORO  03/06/2019   IR CM INJ ANY COLONIC TUBE W/FLUORO  03/11/2019   IR GASTR TUBE CONVERT GASTR-JEJ PER W/FL MOD SED  04/30/2020   IR GENERIC HISTORICAL  01/07/2016   IR GJ TUBE CHANGE 01/07/2016 Jacqulynn Cadet, MD [facility]   IR GENERIC HISTORICAL  01/27/2016   IR MECH REMOV OBSTRUC MAT ANY COLON TUBE W/FLUORO 01/27/2016 Markus Daft, MD [facility]   IR GENERIC HISTORICAL  02/07/2016   IR PATIENT EVAL TECH 0-60  MINS Darrell K Allred, PA-C [facility]   IR GENERIC HISTORICAL  02/08/2016   IR GJ TUBE CHANGE 02/08/2016 Greggory Keen, MD [facility]   IR GENERIC HISTORICAL  01/06/2016   IR GJ TUBE CHANGE 01/06/2016 [facility] OUT REF   IR GENERIC HISTORICAL  05/02/2016   IR CM INJ ANY COLONIC TUBE W/FLUORO 05/02/2016 Arne Cleveland, MD [facility]   IR GENERIC HISTORICAL  05/15/2016   IR GJ TUBE CHANGE 05/15/2016 Sandi Mariscal, MD [facility]   IR GENERIC HISTORICAL  06/28/2016   IR GJ TUBE CHANGE 06/28/2016 [facility]   IR GJ TUBE CHANGE  02/20/2017   IR GJ TUBE CHANGE  05/10/2017   IR GJ TUBE CHANGE  07/06/2017   IR GJ TUBE CHANGE  08/02/2017   IR GJ TUBE CHANGE  10/15/2017   IR GJ TUBE CHANGE  12/26/2017   IR GJ TUBE CHANGE  04/08/2018   IR GJ TUBE CHANGE  06/19/2018   IR GJ TUBE CHANGE  03/03/2019   IR GJ TUBE CHANGE  06/06/2019   IR GJ TUBE CHANGE  09/26/2019   IR GJ TUBE CHANGE  01/09/2020   IR GJ TUBE CHANGE  05/12/2020   IR GJ TUBE CHANGE  10/05/2020   IR GJ TUBE CHANGE  10/14/2020   IR GJ TUBE CHANGE  10/19/2020   IR GJ TUBE CHANGE  02/25/2021   IR GJ TUBE CHANGE  03/23/2021   IR GJ TUBE  CHANGE  08/29/2021   IR GJ TUBE CHANGE  08/30/2021   IR GJ TUBE CHANGE  09/05/2021   IR GJ TUBE CHANGE  01/26/2022   IR PATIENT EVAL TECH 0-60 MINS  10/19/2016   IR PATIENT EVAL TECH 0-60 MINS  12/25/2016   IR PATIENT EVAL TECH 0-60 MINS  01/29/2017   IR PATIENT EVAL TECH 0-60 MINS  04/04/2017   IR PATIENT EVAL TECH 0-60 MINS  04/30/2017   IR PATIENT EVAL TECH 0-60 MINS  07/31/2017   IR REPLC DUODEN/JEJUNO TUBE PERCUT W/FLUORO  11/14/2016   IR REPLC GASTRO/COLONIC TUBE PERCUT W/FLUORO  05/31/2021   IRRIGATION AND DEBRIDEMENT FOOT Bilateral 12/26/2020   Procedure: IRRIGATION AND DEBRIDEMENT FOOT AND BONE BIOPSY;  Surgeon: Trula Slade, DPM;  Location: [facility];  Service: Podiatry;  Laterality: Bilateral;   LAPAROTOMY N/A 01/20/2015   Procedure: EXPLORATORY LAPAROTOMY;  Surgeon: Coralie Keens, MD;  Location: [facility];  Service: General;  Laterality: N/A;   LOWER EXTREMITY ANGIOGRAPHY Left 09/21/2016   Procedure: Lower Extremity Angiography;  Surgeon: Waynetta Sandy, MD;  Location: [facility];  Service: Cardiovascular;  Laterality: Left;   LOWER EXTREMITY ANGIOGRAPHY Right 06/27/2017   Procedure: Lower Extremity Angiography;  Surgeon: Waynetta Sandy, MD;  Location: [facility];  Service: Cardiovascular;  Laterality: Right;   LOWER EXTREMITY ANGIOGRAPHY Right 12/24/2020   Procedure: LOWER EXTREMITY ANGIOGRAPHY;  Surgeon: Cherre Robins, MD;  Location: [facility];  Service: Cardiovascular;  Laterality: Right;   LYSIS OF ADHESION N/A 01/20/2015   Procedure: LYSIS OF ADHESIONS < 1 HOUR;  Surgeon: Coralie Keens, MD;  Location: [facility];  Service: General;  Laterality: N/A;   PERIPHERAL VASCULAR BALLOON ANGIOPLASTY Left 09/21/2016   Procedure: Peripheral Vascular Balloon Angioplasty;  Surgeon: Waynetta Sandy, MD;  Location: [facility];  Service: Cardiovascular;  Laterality: Left;  drug coated balloon   PERIPHERAL VASCULAR BALLOON  ANGIOPLASTY Right 06/27/2017   Procedure: PERIPHERAL VASCULAR BALLOON ANGIOPLASTY;  Surgeon: Waynetta Sandy, MD;  Location: [facility];  Service: Cardiovascular;  Laterality: Right;  SFA/[id number]   PERIPHERAL VASCULAR INTERVENTION Left 10/20/2020   Procedure: PERIPHERAL VASCULAR INTERVENTION;  Surgeon: Cherre Robins, MD;  Location: [facility];  Service: Cardiovascular;  Laterality: Left;   TONSILLECTOMY     TRANSMETATARSAL AMPUTATION Right 11/14/2019   Procedure: TRANSMETATARSAL AMPUTATION RIGHT FOOT;  Surgeon: Edrick Kins, DPM;  Location: [facility];  Service: Podiatry;  Laterality: Right;   TUBAL LIGATION     VENTRAL HERNIA REPAIR  2015   incarcerated ventral hernia ([facility] 09/2013)/notes 01/10/2015   WOUND EXPLORATION Right 10/20/2020   Procedure: PRIMARY CLOSURE COMMON FEMORAL ARTERY;  Surgeon: Cherre Robins, MD;  Location: [facility] OR;  Service: Vascular;  Laterality: Right;    Social History   Socioeconomic History   Marital status: Widowed    Spouse name: Not on file   Number of children: 3   Years of education: Not on file   Highest education level: Not on file  Occupational History   Not on file  Tobacco Use   Smoking status: Never    Passive exposure: Never   Smokeless tobacco: Former    Types: Snuff    Quit date: 04/03/1980   Tobacco comments:    "used snuff in my younger days"  Vaping Use   Vaping Use: Never used  Substance and Sexual Activity   Alcohol use: Never    Alcohol/week: 0.0 standard drinks of alcohol   Drug use: Never   Sexual activity: Not Currently  Other Topics Concern   Not on file  Social History Narrative   Married in 1947, lives in one story house with 2 people and no petsOccupation: ?Has a living Will, [place], and doesn't have DNRWas living with Husband (in frail health) in [place] until 09/2013.  After her [facility] admission they both came to live with daughter in [place] Strain: Low Risk  (10/05/2017)   Overall Financial Resource Strain (CARDIA)    Difficulty of Paying Living Expenses: Not hard at all  Food Insecurity: No Food Insecurity (08/11/2021)   Hunger Vital Sign    Worried About Running Out of Food in the Last Year: Never true    Ran Out of Food in the Last Year: Never true  Transportation Needs: No Transportation Needs (08/11/2021)   PRAPARE - Hydrologist (Medical): No    Lack of Transportation (Non-Medical): No  Physical Activity: Inactive (10/05/2017)   Exercise Vital Sign    Days of Exercise per Week: 0 days    Minutes of Exercise per Session: 0 min  Stress: No Stress Concern Present (10/05/2017)   [place]    Feeling of Stress : Not at all  Social Connections: Moderately Isolated (10/05/2017)   Social Connection and Isolation Panel [NHANES]    Frequency of Communication with Friends and Family: More than three times a week    Frequency of Social Gatherings with Friends and Family: More than three times a week    Attends Religious Services: Never    Marine scientist or Organizations: No    Attends Archivist Meetings: Never    Marital Status: Widowed  Intimate Partner Violence: Not At Risk (10/05/2017)   Humiliation, Afraid, Rape, and Kick questionnaire    Fear of Current or Ex-Partner: No    Emotionally Abused: No  Physically Abused: No    Sexually Abused: No    Family History  Problem Relation Age of Onset   Stroke Mother    Hypertension Mother    Diabetes Brother    Glaucoma Son    Glaucoma Son    Heart attack Neg Hx    Colon cancer Neg Hx    Stomach cancer Neg Hx     Current Outpatient Medications  Medication Sig Dispense Refill   acetaminophen (TYLENOL) 500 MG tablet Take 500 mg by mouth every 6 (six) hours as needed for moderate pain.     albuterol (VENTOLIN HFA) 108 (90 Base) MCG/ACT inhaler  Inhale 2 puffs into the lungs every 6 (six) hours as needed for wheezing or shortness of breath. 8 g 2   amLODipine (NORVASC) 5 MG tablet TAKE 1 TABLET BY MOUTH EVERY DAY 90 tablet 3   ASPIRIN LOW DOSE 81 MG tablet TAKE 1 TABLET (81 MG) BY MOUTH DAILY 90 tablet 3   atorvastatin (LIPITOR) 10 MG tablet TAKE 1 TABLET BY MOUTH EVERY DAY 90 tablet 1   b complex vitamins tablet Take 1 tablet by mouth daily.      cholecalciferol (VITAMIN D3) 25 MCG (1000 UNIT) tablet Take 1,000 Units by mouth daily.     cycloSPORINE (RESTASIS) 0.05 % ophthalmic emulsion Place 1 drop into both eyes 2 (two) times daily.     doxycycline (VIBRA-TABS) 100 MG tablet TAKE 1 TABLET BY MOUTH EVERY DAY 28 tablet 1   DULoxetine (CYMBALTA) 60 MG capsule Take 1 capsule (60 mg total) by mouth daily. 90 capsule 1   ferrous sulfate 220 (44 Fe) MG/5ML solution TAKE 7.4 MLS (325 MG TOTAL) BY MOUTH DAILY. 150 mL 11   fluticasone (FLONASE) 50 MCG/ACT nasal spray SPRAY 2 SPRAYS INTO EACH NOSTRIL EVERY DAY 48 mL 2   furosemide (LASIX) 40 MG tablet TAKE 0.5-1 TABLETS (20-40 MG TOTAL) BY MOUTH SEE ADMIN INSTRUCTIONS. TAKE 20 MG DAILY, MAY INSTEAD TAKE 40 MG DAILY AS NEEDED FOR EDEMA 30 tablet 8   gabapentin (NEURONTIN) 300 MG capsule TAKE 1 CAPSULE BY MOUTH TWICE A DAY 180 capsule 1   gentamicin cream (GARAMYCIN) 0.1 % Apply 1 application topically daily as needed (dry skin). 30 g 1   hydrALAZINE (APRESOLINE) 50 MG tablet Take 1 tablet (50 mg total) by mouth 3 (three) times daily. 270 tablet 3   latanoprost (XALATAN) 0.005 % ophthalmic solution Place 1 drop into both eyes at bedtime.     levothyroxine (SYNTHROID) 100 MCG tablet Take 1 tablet (100 mcg total) by mouth daily. 90 tablet 1   loratadine (CLARITIN) 10 MG tablet Take 10 mg by mouth daily.     mometasone-formoterol (DULERA) 200-5 MCG/ACT AERO Inhale 2 puffs into the lungs 2 (two) times daily. 13 g 1   Multiple Vitamin (MULTIVITAMIN WITH MINERALS) TABS tablet Take 1 tablet by mouth  daily.     Nutritional Supplements (FEEDING SUPPLEMENT, OSMOLITE 1.5 CAL,) LIQD Place 780 mLs into feeding tube See admin instructions. Take 780 ml over a 12 hour period per day 9pm-9am at a rate of 55     nystatin (MYCOSTATIN) 100000 UNIT/ML suspension Take 5 mLs (500,000 Units total) by mouth 4 (four) times daily. 60 mL 0   nystatin cream (MYCOSTATIN) Apply 1 application  topically 2 (two) times daily.     pantoprazole (PROTONIX) 40 MG tablet TAKE 1 TABLET BY MOUTH TWICE A DAY 180 tablet 2   Polyethyl Glycol-Propyl Glycol (SYSTANE) 0.4-0.3 % GEL  ophthalmic gel Place 1 application into both eyes 2 (two) times daily as needed (moisture).     polyethylene glycol powder (GLYCOLAX/MIRALAX) 17 GM/SCOOP powder Place 17 g into feeding tube every Monday, Wednesday, and Friday at 8 PM.     Probiotic Product (EQL PROBIOTIC COLON SUPPORT PO) Take 1 capsule by mouth daily.     senna-docusate (SENOKOT-S) 8.6-50 MG tablet Take 1 tablet by mouth at bedtime as needed for mild constipation.     Sodium Chloride-Sodium Bicarb (NETI POT SINUS WASH NA) Place 1 application  into the nose 2 (two) times daily.     Water For Irrigation, Sterile (FREE WATER) SOLN Place 180 mLs into feeding tube 3 (three) times daily.     White Petrolatum-Mineral Oil (SYSTANE NIGHTTIME) OINT Place 1 application into both eyes at bedtime.      No current facility-administered medications for this visit.    Allergies  Allergen Reactions   Penicillins Itching, Rash and Other (See Comments)    Tolerated amoxicillin, unasyn, zosyn & cephalosporins in the past. Did it involve swelling of the face/tongue/throat, SOB, or low BP? No Did it involve sudden or severe rash/hives, skin peeling, or any reaction on the inside of your mouth or nose? No Did you need to seek medical attention at a hospital or doctor's office? Unknown When did it last happen?      5+ years If all above answers are "NO", may proceed with cephalosporin use.       REVIEW OF SYSTEMS:  [X]$  denotes positive finding, [ ]$  denotes negative finding Cardiac  Comments:  Chest pain or chest pressure:    Shortness of breath upon exertion:    Short of breath when lying flat:    Irregular heart rhythm:        Vascular    Pain in calf, thigh, or hip brought on by ambulation:    Pain in feet at night that wakes you up from your sleep:     Blood clot in your veins:    Leg swelling:         Pulmonary    Oxygen at home:    Productive cough:     Wheezing:         Neurologic    Sudden weakness in arms or legs:     Sudden numbness in arms or legs:     Sudden onset of difficulty speaking or slurred speech:    Temporary loss of vision in one eye:     Problems with dizziness:         Gastrointestinal    Blood in stool:     Vomited blood:         Genitourinary    Burning when urinating:     Blood in urine:        Psychiatric    Major depression:         Hematologic    Bleeding problems:    Problems with blood clotting too easily:        Skin    Rashes or ulcers:        Constitutional    Fever or chills:      PHYSICAL EXAMINATION:  Vitals:   05/23/22 1217  BP: (!) 152/60  Pulse: 87  Resp: 20  Temp: 98.7 F (37.1 C)  TempSrc: Temporal  SpO2: 99%  Weight: 130 lb 4.8 oz (59.1 kg)  Height: 5' 6"$  (1.676 m)    General:  WDWN in NAD; vital signs documented  above Gait: uses wheel chair HENT: WNL, normocephalic Pulmonary: normal non-labored breathing , without wheezing Cardiac: regular HR, Vascular Exam/Pulses: Doppler left Dp/ PT/ Pero monophasic signals. Left Doppler DP and PT monophasic signals. Both feet warm and well perfused.  Extremities: with ischemic changes, without Gangrene , without cellulitis; with open wound off left medial great toe, medial aspect of left 3rd, and plantar distal aspect of right TMA    Musculoskeletal: no muscle wasting or atrophy  Neurologic: A&O X 3;  No focal weakness or paresthesias are  detected Psychiatric:  The pt has Normal affect.   Non-Invasive Vascular Imaging:   VAS [place] lower Extremity Arterial Duplex: Summary:  Right: Patent arterial system without evidence of hemodynamically significant stenosis.  Limited visualization due to circumferential calcification with shadowing.   Left: Patent arterial system with evidence of 30-49% stenosis in the CFA and 50-74% stenosis in the proximal SFA. Limited visualization due to circumferential calcification with shadowing.   ABI's are non compressible bilaterally. Unable to obtain TBI's due to wounds and prior toe amputations  ASSESSMENT/PLAN:: 89 y.o. female here for follow up for  PAD. New recurrent ulceration of left great toe. Now with what seems to be rest pain in left leg and foot. She is caring for wounds on both feet with Iodosorb. Right TMA ulcers stable. She has some pain in right leg. Not classically claudication or rest pain. She has known single vessel peroneal flow. I have recommended continued local wound care and hopefully this will resolve. Her ABI today is essentially unchanged. She has non compressible vessels with monophasic flow. Duplex today shows elevated velocities in the left SFA. Monophasic flow bilaterally with difficulty visualizing popliteal arteries and runoff.  - I have encouraged her to continue her activity as tolerated - Local wound care to both feet and follow up with Podiatrist - continue Aspirin and Statin - Due to new LLE ulceration and rest pain I have recommended repeat Angiography. I will arrange this in the near future with Dr. Stanford Breed.   Karoline Caldwell, PA-C [facility] [phone]  Clinic MD:   Roxanne Mins

## 2022-05-24 ENCOUNTER — Telehealth: Payer: Self-pay

## 2022-05-24 NOTE — Telephone Encounter (Signed)
Attempted to reach patient/daughter at numbers listed to schedule aortogram. Left voicemail messages to return call.

## 2022-05-26 ENCOUNTER — Other Ambulatory Visit: Payer: Self-pay

## 2022-05-26 DIAGNOSIS — I70245 Atherosclerosis of native arteries of left leg with ulceration of other part of foot: Secondary | ICD-10-CM

## 2022-05-26 NOTE — Telephone Encounter (Signed)
2nd attempt to reach patient/daughter to schedule aortogram, no answer, lf vm.

## 2022-05-26 NOTE — Telephone Encounter (Signed)
Pt's daughter called, surgery scheduled for 06/02/22 arriving @ 1130. Instructions reviewed. Daughter requested overnight stay d/t complications after previous procedure. Will relay information to Dr. Stanford Breed. Verbalized understanding.

## 2022-05-30 ENCOUNTER — Other Ambulatory Visit: Payer: Self-pay | Admitting: *Deleted

## 2022-06-02 ENCOUNTER — Encounter (HOSPITAL_COMMUNITY): Payer: Self-pay | Admitting: Vascular Surgery

## 2022-06-02 ENCOUNTER — Other Ambulatory Visit (HOSPITAL_COMMUNITY): Payer: Self-pay

## 2022-06-02 ENCOUNTER — Encounter (HOSPITAL_COMMUNITY)
Admission: RE | Disposition: A | Discharge status: Home / Self Care | Payer: Self-pay | Source: Home / Self Care | Attending: Vascular Surgery

## 2022-06-02 ENCOUNTER — Ambulatory Visit (HOSPITAL_COMMUNITY)
Admission: RE | Admit: 2022-06-02 | Discharge: 2022-06-02 | Disposition: A | Discharge status: Home / Self Care | Payer: Medicare Other | Attending: Vascular Surgery | Admitting: Vascular Surgery

## 2022-06-02 ENCOUNTER — Other Ambulatory Visit: Payer: Self-pay

## 2022-06-02 DIAGNOSIS — E1151 Type 2 diabetes mellitus with diabetic peripheral angiopathy without gangrene: Secondary | ICD-10-CM | POA: Insufficient documentation

## 2022-06-02 DIAGNOSIS — E1140 Type 2 diabetes mellitus with diabetic neuropathy, unspecified: Secondary | ICD-10-CM | POA: Insufficient documentation

## 2022-06-02 DIAGNOSIS — I5032 Chronic diastolic (congestive) heart failure: Secondary | ICD-10-CM | POA: Insufficient documentation

## 2022-06-02 DIAGNOSIS — Z79899 Other long term (current) drug therapy: Secondary | ICD-10-CM | POA: Diagnosis not present

## 2022-06-02 DIAGNOSIS — I70245 Atherosclerosis of native arteries of left leg with ulceration of other part of foot: Secondary | ICD-10-CM | POA: Insufficient documentation

## 2022-06-02 DIAGNOSIS — Z7982 Long term (current) use of aspirin: Secondary | ICD-10-CM | POA: Diagnosis not present

## 2022-06-02 DIAGNOSIS — Z87891 Personal history of nicotine dependence: Secondary | ICD-10-CM | POA: Insufficient documentation

## 2022-06-02 DIAGNOSIS — E11621 Type 2 diabetes mellitus with foot ulcer: Secondary | ICD-10-CM | POA: Diagnosis not present

## 2022-06-02 DIAGNOSIS — I130 Hypertensive heart and chronic kidney disease with heart failure and stage 1 through stage 4 chronic kidney disease, or unspecified chronic kidney disease: Secondary | ICD-10-CM | POA: Insufficient documentation

## 2022-06-02 DIAGNOSIS — L97529 Non-pressure chronic ulcer of other part of left foot with unspecified severity: Secondary | ICD-10-CM | POA: Insufficient documentation

## 2022-06-02 DIAGNOSIS — N183 Chronic kidney disease, stage 3 unspecified: Secondary | ICD-10-CM | POA: Insufficient documentation

## 2022-06-02 DIAGNOSIS — E1122 Type 2 diabetes mellitus with diabetic chronic kidney disease: Secondary | ICD-10-CM | POA: Diagnosis not present

## 2022-06-02 HISTORY — PX: PERIPHERAL VASCULAR BALLOON ANGIOPLASTY: CATH118281

## 2022-06-02 HISTORY — PX: PERIPHERAL VASCULAR INTERVENTION: CATH118257

## 2022-06-02 HISTORY — PX: ABDOMINAL AORTOGRAM W/LOWER EXTREMITY: CATH118223

## 2022-06-02 LAB — POCT I-STAT, CHEM 8
BUN: 56 mg/dL — ABNORMAL HIGH (ref 8–23)
Calcium, Ion: 1.2 mmol/L (ref 1.15–1.40)
Chloride: 101 mmol/L (ref 98–111)
Creatinine, Ser: 2 mg/dL — ABNORMAL HIGH (ref 0.44–1.00)
Glucose, Bld: 104 mg/dL — ABNORMAL HIGH (ref 70–99)
HCT: 30 % — ABNORMAL LOW (ref 36.0–46.0)
Hemoglobin: 10.2 g/dL — ABNORMAL LOW (ref 12.0–15.0)
Potassium: 4.5 mmol/L (ref 3.5–5.1)
Sodium: 137 mmol/L (ref 135–145)
TCO2: 26 mmol/L (ref 22–32)

## 2022-06-02 SURGERY — ABDOMINAL AORTOGRAM W/LOWER EXTREMITY
Anesthesia: LOCAL

## 2022-06-02 MED ORDER — ACETAMINOPHEN 325 MG PO TABS: 650.0000 mg | ORAL_TABLET | ORAL | Status: DC | PRN

## 2022-06-02 MED ORDER — FENTANYL CITRATE (PF) 100 MCG/2ML IJ SOLN
INTRAMUSCULAR | Status: DC | PRN
  Administered 2022-06-02 (×2): 12.5 ug via INTRAVENOUS

## 2022-06-02 MED ORDER — IODIXANOL 320 MG/ML IV SOLN
INTRAVENOUS | Status: DC | PRN
  Administered 2022-06-02: 30 mL via INTRA_ARTERIAL

## 2022-06-02 MED ORDER — SODIUM CHLORIDE 0.9% FLUSH: 3.0000 mL | Freq: Two times a day (BID) | INTRAVENOUS | Status: DC

## 2022-06-02 MED ORDER — LIDOCAINE HCL (PF) 1 % IJ SOLN
INTRAMUSCULAR | Status: DC | PRN
  Administered 2022-06-02: 10 mL

## 2022-06-02 MED ORDER — SODIUM CHLORIDE 0.9 % IV SOLN: 250.0000 mL | INTRAVENOUS | Status: DC | PRN

## 2022-06-02 MED ORDER — LABETALOL HCL 5 MG/ML IV SOLN: 10.0000 mg | INTRAVENOUS | Status: DC | PRN

## 2022-06-02 MED ORDER — LIDOCAINE HCL (PF) 1 % IJ SOLN
INTRAMUSCULAR | Status: AC
  Filled 2022-06-02: qty 30

## 2022-06-02 MED ORDER — SODIUM CHLORIDE 0.9 % IV SOLN: INTRAVENOUS | Status: DC

## 2022-06-02 MED ORDER — CLOPIDOGREL BISULFATE 75 MG PO TABS
75.0000 mg | ORAL_TABLET | Freq: Every day | ORAL | 11 refills | Status: DC
  Filled 2022-06-02: qty 30, 30d supply, fill #0

## 2022-06-02 MED ORDER — HEPARIN SODIUM (PORCINE) 1000 UNIT/ML IJ SOLN
INTRAMUSCULAR | Status: AC
  Filled 2022-06-02: qty 10

## 2022-06-02 MED ORDER — SODIUM CHLORIDE 0.9% FLUSH: 3.0000 mL | INTRAVENOUS | Status: DC | PRN

## 2022-06-02 MED ORDER — CLOPIDOGREL BISULFATE 300 MG PO TABS
ORAL_TABLET | ORAL | Status: AC
  Filled 2022-06-02: qty 1

## 2022-06-02 MED ORDER — MIDAZOLAM HCL 2 MG/2ML IJ SOLN
INTRAMUSCULAR | Status: DC | PRN
  Administered 2022-06-02: .5 mg via INTRAVENOUS

## 2022-06-02 MED ORDER — MIDAZOLAM HCL 5 MG/5ML IJ SOLN
INTRAMUSCULAR | Status: AC
  Filled 2022-06-02: qty 5

## 2022-06-02 MED ORDER — HYDRALAZINE HCL 20 MG/ML IJ SOLN: 5.0000 mg | INTRAMUSCULAR | Status: DC | PRN

## 2022-06-02 MED ORDER — HEPARIN SODIUM (PORCINE) 1000 UNIT/ML IJ SOLN
INTRAMUSCULAR | Status: DC | PRN
  Administered 2022-06-02: 5000 [IU] via INTRA_ARTERIAL

## 2022-06-02 MED ORDER — SODIUM CHLORIDE 0.9 % WEIGHT BASED INFUSION: 1.0000 mL/kg/h | INTRAVENOUS | Status: DC

## 2022-06-02 MED ORDER — ONDANSETRON HCL 4 MG/2ML IJ SOLN: 4.0000 mg | Freq: Four times a day (QID) | INTRAMUSCULAR | Status: DC | PRN

## 2022-06-02 MED ORDER — FENTANYL CITRATE (PF) 100 MCG/2ML IJ SOLN
INTRAMUSCULAR | Status: AC
  Filled 2022-06-02: qty 2

## 2022-06-02 MED ORDER — HEPARIN (PORCINE) IN NACL 1000-0.9 UT/500ML-% IV SOLN
INTRAVENOUS | Status: DC | PRN
  Administered 2022-06-02 (×2): 500 mL

## 2022-06-02 MED ORDER — CLOPIDOGREL BISULFATE 75 MG PO TABS: 75.0000 mg | ORAL_TABLET | Freq: Every day | ORAL | Status: DC

## 2022-06-02 MED ORDER — CLOPIDOGREL BISULFATE 75 MG PO TABS
300.0000 mg | ORAL_TABLET | Freq: Once | ORAL | Status: AC
  Administered 2022-06-02: 300 mg via ORAL

## 2022-06-02 MED ORDER — CLOPIDOGREL BISULFATE 300 MG PO TABS
ORAL_TABLET | ORAL | Status: DC | PRN
  Administered 2022-06-02: 300 mg via ORAL

## 2022-06-02 SURGICAL SUPPLY — 25 items
BALLN MUSTANG 5.0X40 135 (BALLOONS) ×2
BALLN STERLING SL OTW 3X80X150 (BALLOONS) ×2
BALLOON MUSTANG 5.0X40 135 (BALLOONS) IMPLANT
BALLOON STRLNG SL OTW 3X80X150 (BALLOONS) IMPLANT
CATH OMNI FLUSH 5F 65CM (CATHETERS) IMPLANT
DEVICE CLOSURE MYNXGRIP 6/7F (Vascular Products) IMPLANT
GLIDEWIRE ADV .035X260CM (WIRE) IMPLANT
KIT ANGIASSIST CO2 SYSTEM (KITS) IMPLANT
KIT ENCORE 26 ADVANTAGE (KITS) IMPLANT
KIT MICROPUNCTURE NIT STIFF (SHEATH) IMPLANT
KIT PV (KITS) ×2 IMPLANT
PROTECTION STATION PRESSURIZED (MISCELLANEOUS) ×2
SHEATH CATAPULT 6FR 45 (SHEATH) IMPLANT
SHEATH PINNACLE 5F 10CM (SHEATH) IMPLANT
SHEATH PINNACLE 6F 10CM (SHEATH) IMPLANT
SHEATH PROBE COVER 6X72 (BAG) IMPLANT
STATION PROTECTION PRESSURIZED (MISCELLANEOUS) IMPLANT
STENT ELUVIA 6X40X130 (Permanent Stent) IMPLANT
STOPCOCK MORSE 400PSI 3WAY (MISCELLANEOUS) IMPLANT
SYR MEDRAD MARK 7 150ML (SYRINGE) ×2 IMPLANT
TRANSDUCER W/STOPCOCK (MISCELLANEOUS) ×2 IMPLANT
TRAY PV CATH (CUSTOM PROCEDURE TRAY) ×2 IMPLANT
TUBING CIL FLEX 10 FLL-RA (TUBING) IMPLANT
WIRE G V18X300CM (WIRE) IMPLANT
WIRE STARTER BENTSON 035X150 (WIRE) IMPLANT

## 2022-06-02 NOTE — Op Note (Signed)
DATE OF SERVICE: 06/02/2022  PATIENT:  Shannon [name]  89 y.o. female  PRE-OPERATIVE DIAGNOSIS:  Atherosclerosis of native arteries of left lower extremity causing ulceration  POST-OPERATIVE DIAGNOSIS:  Same  PROCEDURE:   1) Ultrasound guided right common femoral artery access 2) Aortogram 3) Left lower extremity angiogram with third order cannulation (64m total contrast) 4) Left SFA angioplasty and stenting (6x47mEluvia) 5) Left tibioperoneal trunk angioplasty (3x8035mterling) 6) Left peroneal artery angioplasty (3x80m20merling) 7) Conscious sedation (52 minutes)   SURGEON:  ThomYevonne AlinewkStanford Breed  ASSISTANT: none  ANESTHESIA:   local and IV sedation  ESTIMATED BLOOD LOSS: minimal  LOCAL MEDICATIONS USED:  LIDOCAINE   COUNTS: confirmed correct.  PATIENT DISPOSITION:  PACU - hemodynamically stable.   Delay start of Pharmacological VTE agent (>24hrs) due to surgical blood loss or risk of bleeding: no  INDICATION FOR PROCEDURE: Shannon [name] 89 y78. female with left foot ulceration in setting of peripheral arterial disease. After careful discussion of risks, benefits, and alternatives the patient was offered angiography. The patient understood and wished to proceed.  OPERATIVE FINDINGS:  Terminal aorta and iliac arteries: Widely patent  Left lower extremity: Common femoral artery: widely patent  Profunda femoris artery: widely patent  Superficial femoral artery: diffuse disease. proximal, focal ~80% stenosis Popliteal artery: diffuse disease Anterior tibial artery: occludes shortly after origin Tibioperoneal trunk: diffuse disease, diffuse stenosis, greatest >70%. Peroneal artery: diffuse disease, proximal stenosis, greatest >70% Posterior tibial artery: dominant tibial, courses to foot and fills pedal vessels Pedal circulation: fills via PT  GLASS score. FP 2. IP 0. P 0. Stage II.  WIFI: 2 / 3 / 0. Stage IV.  DESCRIPTION OF PROCEDURE: After identification of  the patient in the pre-operative holding area, the patient was transferred to the operating room. The patient was positioned supine on the operating room table. Anesthesia was induced. The groins was prepped and draped in standard fashion. A surgical pause was performed confirming correct patient, procedure, and operative location.  The right groin was anesthetized with subcutaneous injection of 1% lidocaine. Using ultrasound guidance, the right common femoral artery was accessed with micropuncture technique. Fluoroscopy was used to confirm cannulation over the femoral head. The 43F sheath was upsized to 850F.   A Benson wire was advanced into the distal aorta. Over the wire an omni flush catheter was advanced to the level of L2. Aortogram was performed - see above for details.   The left common iliac artery was selected with an omniflush catheter and Glidewire advantage guidewire. The wire was advanced into the common femoral artery. Over the wire the omni flush catheter was advanced into the external iliac artery. Selective angiography was performed - see above for details.   The decision was made to intervene. The patient was heparinized with 5000 units of heparin. The 850F sheath was exchanged for a 50F x 45cm sheath. Selective angiography of the left lower extremity was performed prior to intervention.   The lesions were treated with: Left SFA angioplasty and stenting (6x40mm17mvia) Left tibioperoneal trunk angioplasty (3x80mm 90mling) Left peroneal artery angioplasty (3x80mm S71ming)  Completion angiography revealed:  Resolution of SFA stenosis Resolution of TP / Peroneal stenosis Inline flow to foot restored  A mynx device was used to close the arteriotomy. Hemostasis was excellent upon completion.  Conscious sedation was administered with the use of IV fentanyl and midazolam under continuous physician and nurse monitoring.  Heart rate, blood pressure, and oxygen saturation were  continuously monitored.  Total sedation time was 52 minutes  Upon completion of the case instrument and sharps counts were confirmed correct. The patient was transferred to the PACU in good condition. I was present for all portions of the procedure.  PLAN: ASA '81mg'$  PO QD. Plavix '75mg'$  PO QD. High intensity statin therapy. Follow up with me in 1 month with ABI and LLE duplex.   Shannon [name]. Stanford Breed, MD [facility] Phone Number: [phone] 06/02/2022 10:11 AM

## 2022-06-02 NOTE — Progress Notes (Signed)
I secure messaged Dr Stanford Breed, he is currently in a case. The family is concerned with patient taking Plavix again. Pt bled rectally one year ago. Dr will call patient family member I included daughter/ POA [name]'s number to address concerns with her.

## 2022-06-02 NOTE — Interval H&P Note (Signed)
History and Physical Interval Note:  06/02/2022 10:10 AM  Shannon [name]  has presented today for surgery, with the diagnosis of PAD with ulceration of great toe.  The various methods of treatment have been discussed with the patient and family. After consideration of risks, benefits and other options for treatment, the patient has consented to  Procedure(s): ABDOMINAL AORTOGRAM W/LOWER EXTREMITY (N/A) PERIPHERAL VASCULAR INTERVENTION as a surgical intervention.  The patient's history has been reviewed, patient examined, no change in status, stable for surgery.  I have reviewed the patient's chart and labs.  Questions were answered to the patient's satisfaction.     Cherre Robins

## 2022-06-03 ENCOUNTER — Other Ambulatory Visit: Payer: Self-pay | Admitting: Nurse Practitioner

## 2022-06-03 DIAGNOSIS — E039 Hypothyroidism, unspecified: Secondary | ICD-10-CM

## 2022-06-04 ENCOUNTER — Other Ambulatory Visit: Payer: Self-pay | Admitting: Podiatry

## 2022-06-05 DIAGNOSIS — Z961 Presence of intraocular lens: Secondary | ICD-10-CM | POA: Diagnosis not present

## 2022-06-05 DIAGNOSIS — H401130 Primary open-angle glaucoma, bilateral, stage unspecified: Secondary | ICD-10-CM | POA: Diagnosis not present

## 2022-06-05 DIAGNOSIS — E119 Type 2 diabetes mellitus without complications: Secondary | ICD-10-CM | POA: Diagnosis not present

## 2022-06-05 DIAGNOSIS — H16223 Keratoconjunctivitis sicca, not specified as Sjogren's, bilateral: Secondary | ICD-10-CM | POA: Diagnosis not present

## 2022-06-05 LAB — HM DIABETES EYE EXAM

## 2022-06-05 NOTE — Telephone Encounter (Signed)
Please advise 

## 2022-06-07 ENCOUNTER — Emergency Department (HOSPITAL_COMMUNITY): Payer: Medicare Other

## 2022-06-07 ENCOUNTER — Inpatient Hospital Stay (HOSPITAL_COMMUNITY)
Admission: EM | Admit: 2022-06-07 | Discharge: 2022-07-03 | DRG: 871 | Disposition: E | Payer: Medicare Other | Attending: Internal Medicine | Admitting: Internal Medicine

## 2022-06-07 ENCOUNTER — Other Ambulatory Visit: Payer: Self-pay

## 2022-06-07 ENCOUNTER — Encounter (HOSPITAL_COMMUNITY): Payer: Self-pay | Admitting: Emergency Medicine

## 2022-06-07 DIAGNOSIS — Z8616 Personal history of COVID-19: Secondary | ICD-10-CM | POA: Diagnosis not present

## 2022-06-07 DIAGNOSIS — E860 Dehydration: Secondary | ICD-10-CM | POA: Diagnosis present

## 2022-06-07 DIAGNOSIS — I480 Paroxysmal atrial fibrillation: Secondary | ICD-10-CM | POA: Diagnosis not present

## 2022-06-07 DIAGNOSIS — Z515 Encounter for palliative care: Secondary | ICD-10-CM

## 2022-06-07 DIAGNOSIS — Z86718 Personal history of other venous thrombosis and embolism: Secondary | ICD-10-CM

## 2022-06-07 DIAGNOSIS — Z66 Do not resuscitate: Secondary | ICD-10-CM | POA: Diagnosis not present

## 2022-06-07 DIAGNOSIS — R1310 Dysphagia, unspecified: Secondary | ICD-10-CM | POA: Diagnosis present

## 2022-06-07 DIAGNOSIS — N1832 Chronic kidney disease, stage 3b: Secondary | ICD-10-CM | POA: Diagnosis present

## 2022-06-07 DIAGNOSIS — L03116 Cellulitis of left lower limb: Secondary | ICD-10-CM | POA: Diagnosis present

## 2022-06-07 DIAGNOSIS — N179 Acute kidney failure, unspecified: Secondary | ICD-10-CM | POA: Diagnosis not present

## 2022-06-07 DIAGNOSIS — I5033 Acute on chronic diastolic (congestive) heart failure: Secondary | ICD-10-CM | POA: Diagnosis not present

## 2022-06-07 DIAGNOSIS — Z961 Presence of intraocular lens: Secondary | ICD-10-CM | POA: Diagnosis present

## 2022-06-07 DIAGNOSIS — I959 Hypotension, unspecified: Secondary | ICD-10-CM | POA: Diagnosis not present

## 2022-06-07 DIAGNOSIS — F419 Anxiety disorder, unspecified: Secondary | ICD-10-CM | POA: Diagnosis present

## 2022-06-07 DIAGNOSIS — I2489 Other forms of acute ischemic heart disease: Secondary | ICD-10-CM | POA: Diagnosis not present

## 2022-06-07 DIAGNOSIS — L97519 Non-pressure chronic ulcer of other part of right foot with unspecified severity: Secondary | ICD-10-CM | POA: Diagnosis present

## 2022-06-07 DIAGNOSIS — Z95820 Peripheral vascular angioplasty status with implants and grafts: Secondary | ICD-10-CM | POA: Diagnosis not present

## 2022-06-07 DIAGNOSIS — E039 Hypothyroidism, unspecified: Secondary | ICD-10-CM | POA: Diagnosis present

## 2022-06-07 DIAGNOSIS — Z7982 Long term (current) use of aspirin: Secondary | ICD-10-CM

## 2022-06-07 DIAGNOSIS — I130 Hypertensive heart and chronic kidney disease with heart failure and stage 1 through stage 4 chronic kidney disease, or unspecified chronic kidney disease: Secondary | ICD-10-CM | POA: Diagnosis present

## 2022-06-07 DIAGNOSIS — I2510 Atherosclerotic heart disease of native coronary artery without angina pectoris: Secondary | ICD-10-CM | POA: Diagnosis present

## 2022-06-07 DIAGNOSIS — Z79890 Hormone replacement therapy (postmenopausal): Secondary | ICD-10-CM

## 2022-06-07 DIAGNOSIS — J690 Pneumonitis due to inhalation of food and vomit: Secondary | ICD-10-CM | POA: Diagnosis not present

## 2022-06-07 DIAGNOSIS — Z880 Allergy status to penicillin: Secondary | ICD-10-CM

## 2022-06-07 DIAGNOSIS — J9811 Atelectasis: Secondary | ICD-10-CM | POA: Diagnosis present

## 2022-06-07 DIAGNOSIS — Z7902 Long term (current) use of antithrombotics/antiplatelets: Secondary | ICD-10-CM

## 2022-06-07 DIAGNOSIS — I10 Essential (primary) hypertension: Secondary | ICD-10-CM | POA: Diagnosis not present

## 2022-06-07 DIAGNOSIS — Z823 Family history of stroke: Secondary | ICD-10-CM

## 2022-06-07 DIAGNOSIS — K469 Unspecified abdominal hernia without obstruction or gangrene: Secondary | ICD-10-CM | POA: Diagnosis present

## 2022-06-07 DIAGNOSIS — Z9049 Acquired absence of other specified parts of digestive tract: Secondary | ICD-10-CM

## 2022-06-07 DIAGNOSIS — F32A Depression, unspecified: Secondary | ICD-10-CM | POA: Diagnosis present

## 2022-06-07 DIAGNOSIS — S91301A Unspecified open wound, right foot, initial encounter: Secondary | ICD-10-CM | POA: Diagnosis present

## 2022-06-07 DIAGNOSIS — J9601 Acute respiratory failure with hypoxia: Secondary | ICD-10-CM | POA: Diagnosis not present

## 2022-06-07 DIAGNOSIS — Z931 Gastrostomy status: Secondary | ICD-10-CM

## 2022-06-07 DIAGNOSIS — R6520 Severe sepsis without septic shock: Secondary | ICD-10-CM | POA: Diagnosis not present

## 2022-06-07 DIAGNOSIS — A419 Sepsis, unspecified organism: Principal | ICD-10-CM | POA: Diagnosis present

## 2022-06-07 DIAGNOSIS — J4489 Other specified chronic obstructive pulmonary disease: Secondary | ICD-10-CM | POA: Diagnosis present

## 2022-06-07 DIAGNOSIS — R4182 Altered mental status, unspecified: Secondary | ICD-10-CM | POA: Diagnosis not present

## 2022-06-07 DIAGNOSIS — Z934 Other artificial openings of gastrointestinal tract status: Secondary | ICD-10-CM

## 2022-06-07 DIAGNOSIS — I509 Heart failure, unspecified: Secondary | ICD-10-CM | POA: Diagnosis not present

## 2022-06-07 DIAGNOSIS — T508X5A Adverse effect of diagnostic agents, initial encounter: Secondary | ICD-10-CM | POA: Diagnosis present

## 2022-06-07 DIAGNOSIS — Z89422 Acquired absence of other left toe(s): Secondary | ICD-10-CM

## 2022-06-07 DIAGNOSIS — Z89411 Acquired absence of right great toe: Secondary | ICD-10-CM

## 2022-06-07 DIAGNOSIS — R0602 Shortness of breath: Secondary | ICD-10-CM | POA: Diagnosis not present

## 2022-06-07 DIAGNOSIS — Z8711 Personal history of peptic ulcer disease: Secondary | ICD-10-CM

## 2022-06-07 DIAGNOSIS — Y92239 Unspecified place in hospital as the place of occurrence of the external cause: Secondary | ICD-10-CM | POA: Diagnosis present

## 2022-06-07 DIAGNOSIS — M7989 Other specified soft tissue disorders: Secondary | ICD-10-CM | POA: Diagnosis not present

## 2022-06-07 DIAGNOSIS — Z833 Family history of diabetes mellitus: Secondary | ICD-10-CM

## 2022-06-07 DIAGNOSIS — D631 Anemia in chronic kidney disease: Secondary | ICD-10-CM | POA: Diagnosis not present

## 2022-06-07 DIAGNOSIS — R140 Abdominal distension (gaseous): Secondary | ICD-10-CM | POA: Diagnosis not present

## 2022-06-07 DIAGNOSIS — E785 Hyperlipidemia, unspecified: Secondary | ICD-10-CM | POA: Diagnosis present

## 2022-06-07 DIAGNOSIS — M549 Dorsalgia, unspecified: Secondary | ICD-10-CM | POA: Diagnosis present

## 2022-06-07 DIAGNOSIS — E43 Unspecified severe protein-calorie malnutrition: Secondary | ICD-10-CM | POA: Diagnosis not present

## 2022-06-07 DIAGNOSIS — Z6821 Body mass index (BMI) 21.0-21.9, adult: Secondary | ICD-10-CM

## 2022-06-07 DIAGNOSIS — Z79899 Other long term (current) drug therapy: Secondary | ICD-10-CM

## 2022-06-07 DIAGNOSIS — R638 Other symptoms and signs concerning food and fluid intake: Secondary | ICD-10-CM | POA: Diagnosis not present

## 2022-06-07 DIAGNOSIS — R0902 Hypoxemia: Secondary | ICD-10-CM | POA: Diagnosis not present

## 2022-06-07 DIAGNOSIS — Z1152 Encounter for screening for COVID-19: Secondary | ICD-10-CM

## 2022-06-07 DIAGNOSIS — E1122 Type 2 diabetes mellitus with diabetic chronic kidney disease: Secondary | ICD-10-CM | POA: Diagnosis present

## 2022-06-07 DIAGNOSIS — E1151 Type 2 diabetes mellitus with diabetic peripheral angiopathy without gangrene: Secondary | ICD-10-CM | POA: Diagnosis present

## 2022-06-07 DIAGNOSIS — Z7189 Other specified counseling: Secondary | ICD-10-CM | POA: Diagnosis not present

## 2022-06-07 DIAGNOSIS — I517 Cardiomegaly: Secondary | ICD-10-CM | POA: Diagnosis not present

## 2022-06-07 DIAGNOSIS — Z87891 Personal history of nicotine dependence: Secondary | ICD-10-CM

## 2022-06-07 DIAGNOSIS — R7989 Other specified abnormal findings of blood chemistry: Secondary | ICD-10-CM | POA: Diagnosis not present

## 2022-06-07 DIAGNOSIS — Z789 Other specified health status: Secondary | ICD-10-CM | POA: Diagnosis not present

## 2022-06-07 DIAGNOSIS — Z9981 Dependence on supplemental oxygen: Secondary | ICD-10-CM

## 2022-06-07 DIAGNOSIS — K7689 Other specified diseases of liver: Secondary | ICD-10-CM | POA: Diagnosis not present

## 2022-06-07 DIAGNOSIS — N1411 Contrast-induced nephropathy: Secondary | ICD-10-CM | POA: Diagnosis present

## 2022-06-07 DIAGNOSIS — J90 Pleural effusion, not elsewhere classified: Secondary | ICD-10-CM | POA: Diagnosis not present

## 2022-06-07 DIAGNOSIS — D539 Nutritional anemia, unspecified: Secondary | ICD-10-CM | POA: Diagnosis present

## 2022-06-07 DIAGNOSIS — Z7951 Long term (current) use of inhaled steroids: Secondary | ICD-10-CM

## 2022-06-07 DIAGNOSIS — N178 Other acute kidney failure: Secondary | ICD-10-CM | POA: Diagnosis not present

## 2022-06-07 DIAGNOSIS — R54 Age-related physical debility: Secondary | ICD-10-CM | POA: Diagnosis present

## 2022-06-07 DIAGNOSIS — E1142 Type 2 diabetes mellitus with diabetic polyneuropathy: Secondary | ICD-10-CM | POA: Diagnosis present

## 2022-06-07 DIAGNOSIS — Z8249 Family history of ischemic heart disease and other diseases of the circulatory system: Secondary | ICD-10-CM

## 2022-06-07 DIAGNOSIS — E11621 Type 2 diabetes mellitus with foot ulcer: Secondary | ICD-10-CM | POA: Diagnosis present

## 2022-06-07 DIAGNOSIS — E875 Hyperkalemia: Secondary | ICD-10-CM | POA: Diagnosis present

## 2022-06-07 DIAGNOSIS — Z9842 Cataract extraction status, left eye: Secondary | ICD-10-CM

## 2022-06-07 DIAGNOSIS — Z9841 Cataract extraction status, right eye: Secondary | ICD-10-CM

## 2022-06-07 DIAGNOSIS — Z8673 Personal history of transient ischemic attack (TIA), and cerebral infarction without residual deficits: Secondary | ICD-10-CM

## 2022-06-07 DIAGNOSIS — R609 Edema, unspecified: Secondary | ICD-10-CM | POA: Diagnosis not present

## 2022-06-07 DIAGNOSIS — R509 Fever, unspecified: Secondary | ICD-10-CM | POA: Diagnosis not present

## 2022-06-07 DIAGNOSIS — Z711 Person with feared health complaint in whom no diagnosis is made: Secondary | ICD-10-CM | POA: Diagnosis not present

## 2022-06-07 LAB — CBC WITH DIFFERENTIAL/PLATELET
Abs Immature Granulocytes: 0.08 10*3/uL — ABNORMAL HIGH (ref 0.00–0.07)
Basophils Absolute: 0.1 10*3/uL (ref 0.0–0.1)
Basophils Relative: 0 %
Eosinophils Absolute: 0 10*3/uL (ref 0.0–0.5)
Eosinophils Relative: 0 %
HCT: 25.3 % — ABNORMAL LOW (ref 36.0–46.0)
Hemoglobin: 8.3 g/dL — ABNORMAL LOW (ref 12.0–15.0)
Immature Granulocytes: 1 %
Lymphocytes Relative: 8 %
Lymphs Abs: 1 10*3/uL (ref 0.7–4.0)
MCH: 33.3 pg (ref 26.0–34.0)
MCHC: 32.8 g/dL (ref 30.0–36.0)
MCV: 101.6 fL — ABNORMAL HIGH (ref 80.0–100.0)
Monocytes Absolute: 0.8 10*3/uL (ref 0.1–1.0)
Monocytes Relative: 7 %
Neutro Abs: 10.5 10*3/uL — ABNORMAL HIGH (ref 1.7–7.7)
Neutrophils Relative %: 84 %
Platelets: 213 10*3/uL (ref 150–400)
RBC: 2.49 MIL/uL — ABNORMAL LOW (ref 3.87–5.11)
RDW: 15.8 % — ABNORMAL HIGH (ref 11.5–15.5)
WBC: 12.5 10*3/uL — ABNORMAL HIGH (ref 4.0–10.5)
nRBC: 0.2 % (ref 0.0–0.2)

## 2022-06-07 LAB — APTT: aPTT: 33 seconds (ref 24–36)

## 2022-06-07 LAB — PROTIME-INR
INR: 1.2 (ref 0.8–1.2)
Prothrombin Time: 15.2 seconds (ref 11.4–15.2)

## 2022-06-07 LAB — LACTIC ACID, PLASMA: Lactic Acid, Venous: 1.6 mmol/L (ref 0.5–1.9)

## 2022-06-07 MED ORDER — SODIUM CHLORIDE 0.9 % IV BOLUS
500.0000 mL | Freq: Once | INTRAVENOUS | Status: AC
  Administered 2022-06-07: 500 mL via INTRAVENOUS

## 2022-06-07 MED ORDER — LACTATED RINGERS IV SOLN: INTRAVENOUS | Status: DC

## 2022-06-07 NOTE — ED Triage Notes (Addendum)
Pt bib [facility] from home for altered mental status this evening. Pt had stent placed in left leg on Friday to improve blood flow to wound on left foot. Reported wound on sacrum. 112m LR given pta.   T 100.4, HR 70-120 (afib), BP 100/60, CBG 185

## 2022-06-07 NOTE — ED Provider Notes (Signed)
[facility] Provider Note   CSN: [id number] Arrival date & time: 06/20/2022  2214     History  Chief Complaint  Patient presents with   Altered Mental Status    RASHELL WOLD is a 89 y.o. female.  HPI   Patient with medical history including PAD, status post left SFA stenting, angioplasty of the preneal artery, hypertension, A-fib not on anticoag due to GI bleeds G-tube s/p due to multiple bowel obstructions from loop Gastrojejunostomy to address ulcers presenting with complaints of a fever and altered mental status.  Patient is coming from home, patient is a poor historian, states that she has generalized pain, states her back and her legs, she also notes that she feels short of breath, and had a productive cough just starting, she -denies any stomach pains nausea vomiting diarrhea, she is only complaints.  Spoke with patient's daughter who explains that generally patient is able to ambulate on her own, states that today she was not going to ambulate, she states that she just felt unwell, would not tell her what was wrong, states that she was not taking her medications today either.  There is no recent falls, no endorse any cough congestion stomach pains nausea or vomiting.  Daughter does state that her leg looks slightly more swollen on the left but no other abnormalities noted.  Home Medications Prior to Admission medications   Medication Sig Start Date End Date Taking? Authorizing Provider  acetaminophen (TYLENOL) 500 MG tablet Take 500-1,000 mg by mouth every 6 (six) hours as needed for moderate pain.    [provider]  albuterol (VENTOLIN HFA) 108 (90 Base) MCG/ACT inhaler Inhale 2 puffs into the lungs every 6 (six) hours as needed for wheezing or shortness of breath. 05/15/22   [name], NP  amLODipine (NORVASC) 5 MG tablet TAKE 1 TABLET BY MOUTH EVERY DAY 04/11/22   [name], NP  ASPIRIN LOW DOSE 81 MG tablet  TAKE 1 TABLET (81 MG) BY MOUTH DAILY 10/10/21   Waynetta Sandy, MD  atorvastatin (LIPITOR) 10 MG tablet TAKE 1 TABLET BY MOUTH EVERY DAY 12/30/21   [name], NP  b complex vitamins tablet Take 1 tablet by mouth daily.     [provider]  cholecalciferol (VITAMIN D3) 25 MCG (1000 UNIT) tablet Take 1,000 Units by mouth daily.    [provider]  clopidogrel (PLAVIX) 75 MG tablet Take 1 tablet (75 mg total) by mouth daily. 06/02/22 06/02/23  Cherre Robins, MD  cycloSPORINE (RESTASIS) 0.05 % ophthalmic emulsion Place 1 drop into both eyes 2 (two) times daily.    [provider]  doxycycline (VIBRA-TABS) 100 MG tablet TAKE 1 TABLET BY MOUTH EVERY DAY 06/05/22   Edrick Kins, DPM  DULoxetine (CYMBALTA) 60 MG capsule Take 1 capsule (60 mg total) by mouth daily. 03/30/22   [name], NP  ferrous sulfate 220 (44 Fe) MG/5ML solution TAKE 7.4 MLS (325 MG TOTAL) BY MOUTH DAILY. 09/08/21   [name], NP  fluticasone (FLONASE) 50 MCG/ACT nasal spray SPRAY 2 SPRAYS INTO EACH NOSTRIL EVERY DAY 09/01/21   [name], NP  furosemide (LASIX) 40 MG tablet TAKE 0.5-1 TABLETS (20-40 MG TOTAL) BY MOUTH SEE ADMIN INSTRUCTIONS. TAKE 20 MG DAILY, MAY INSTEAD TAKE 40 MG DAILY AS NEEDED FOR EDEMA 08/04/20   Freada Bergeron, MD  gabapentin (NEURONTIN) 300 MG capsule TAKE 1 CAPSULE BY MOUTH TWICE  A DAY 10/03/21   [name], NP  gentamicin cream (GARAMYCIN) 0.1 % Apply 1 application topically daily as needed (dry skin). 10/01/19   Edrick Kins, DPM  hydrALAZINE (APRESOLINE) 50 MG tablet Take 1 tablet (50 mg total) by mouth 3 (three) times daily. 07/18/21   Freada Bergeron, MD  latanoprost (XALATAN) 0.005 % ophthalmic solution Place 1 drop into both eyes at bedtime.    [provider]  levocetirizine (XYZAL) 5 MG tablet Take 5 mg by mouth every evening.    [provider]  levothyroxine (SYNTHROID) 100 MCG tablet TAKE 1 TABLET BY  MOUTH EVERY DAY 06/05/22   [name], NP  loratadine (CLARITIN) 10 MG tablet Take 10 mg by mouth daily.    [provider]  mometasone-formoterol (DULERA) 200-5 MCG/ACT AERO Inhale 2 puffs into the lungs 2 (two) times daily. 02/13/22   [name], NP  Multiple Vitamin (MULTIVITAMIN WITH MINERALS) TABS tablet Take 1 tablet by mouth daily.    [provider]  Nutritional Supplements (FEEDING SUPPLEMENT, OSMOLITE 1.5 CAL,) LIQD Place 780 mLs into feeding tube See admin instructions. Take 780 ml over a 12 hour period per day 9pm-9am at a rate of 55    [provider]  nystatin (MYCOSTATIN) 100000 UNIT/ML suspension Take 5 mLs (500,000 Units total) by mouth 4 (four) times daily. Patient not taking: Reported on 06/01/2022 03/03/22   [name], NP  nystatin cream (MYCOSTATIN) Apply 1 application  topically 2 (two) times daily as needed for dry skin.    [provider]  oxyCODONE (OXY IR/ROXICODONE) 5 MG immediate release tablet Take 2.5-5 mg by mouth every 4 (four) hours as needed for severe pain.    [provider]  pantoprazole (PROTONIX) 40 MG tablet TAKE 1 TABLET BY MOUTH TWICE A DAY 05/02/22   [name], NP  Polyethyl Glycol-Propyl Glycol (SYSTANE) 0.4-0.3 % GEL ophthalmic gel Place 1 application  into both eyes 4 (four) times daily.    [provider]  polyethylene glycol powder (GLYCOLAX/MIRALAX) 17 GM/SCOOP powder Take 17 g by mouth at bedtime as needed for moderate constipation.    [provider]  Probiotic Product (EQL PROBIOTIC COLON SUPPORT PO) Take 1 capsule by mouth daily.    [provider]  senna-docusate (SENOKOT-S) 8.6-50 MG tablet Take 1 tablet by mouth at bedtime as needed for mild constipation.    [provider]  Sodium Chloride-Sodium Bicarb (NETI POT SINUS [place] NA) Place 1 application  into the nose 2 (two) times daily.    [provider]  Water For Irrigation,  Sterile (FREE WATER) SOLN Place 180 mLs into feeding tube 3 (three) times daily.    [provider]  White Petrolatum-Mineral Oil (SYSTANE NIGHTTIME) OINT Place 1 application into both eyes at bedtime.     [provider]      Allergies    Penicillins    Review of Systems   Review of Systems  Constitutional:  Negative for chills and fever.  Respiratory:  Positive for cough and shortness of breath.   Cardiovascular:  Negative for chest pain.  Gastrointestinal:  Negative for abdominal pain.  Musculoskeletal:        Leg and back pain  Neurological:  Negative for headaches.    Physical Exam Updated Vital Signs BP (!) 126/49   Pulse 68   Temp 99.4 F (37.4 C) (Rectal)   Resp 16   Ht '5\' 6"'$  (1.676 m)  Wt 59 kg   SpO2 100%   BMI 20.98 kg/m  Physical Exam Vitals and nursing note reviewed.  Constitutional:      General: She is not in acute distress.    Appearance: She is not ill-appearing.  HENT:     Head: Normocephalic and atraumatic.     Nose: No congestion.     Mouth/Throat:     Mouth: Mucous membranes are moist.     Pharynx: Oropharynx is clear.  Eyes:     Extraocular Movements: Extraocular movements intact.     Conjunctiva/sclera: Conjunctivae normal.     Pupils: Pupils are equal, round, and reactive to light.  Cardiovascular:     Rate and Rhythm: Normal rate. Rhythm irregular.     Pulses: Normal pulses.     Heart sounds: No murmur heard.    No friction rub. No gallop.  Pulmonary:     Effort: No respiratory distress.     Breath sounds: Rales present. No wheezing or rhonchi.     Comments: No evidence of wheezing  or rhonchi, she does have some slight bibasilar Rales heard worse on the right versus the left. Abdominal:     Palpations: Abdomen is soft.     Tenderness: There is abdominal tenderness. There is no right CVA tenderness or left CVA tenderness.     Hernia: A hernia is present.     Comments: Abdomen nondistended, soft, she has noted G-tube  in the left upper quadrant, she has a noted right-sided hernia, reproducible, but is tender to palpation, there is no guarding rebound tenderness or peritoneal sign.  Musculoskeletal:     Right lower leg: No edema.     Left lower leg: No edema.     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  Slight swelling noted on the left Versus the right, no erythema no drainage or discharge present, she no palpable cords, both legs are warm to the touch, Doppler dorsal pedal pulses bilaterally, sensation intact, pain in not on proportion for presentation.  Skin:    General: Skin is warm and dry.  Neurological:     Mental Status: She is alert.     Comments: No facial asymmetry no difficulty with word finding following two-step commands there is no obvious unilateral weakness present.  Psychiatric:        Mood and Affect: Mood normal.            ED Results / Procedures / Treatments   Labs (all labs ordered are listed, but only abnormal results are displayed) Labs Reviewed  COMPREHENSIVE METABOLIC PANEL - Abnormal; Notable for the following components:      Result Value   Sodium 131 (*)    Potassium 5.2 (*)    Glucose, Bld 131 (*)    BUN 75 (*)    Creatinine, Ser 2.54 (*)    Calcium 8.7 (*)    Albumin 2.8 (*)    GFR, Estimated 17 (*)    All other components within normal limits  CBC WITH DIFFERENTIAL/PLATELET - Abnormal; Notable for the following components:   WBC 12.5 (*)    RBC 2.49 (*)    Hemoglobin 8.3 (*)    HCT 25.3 (*)    MCV 101.6 (*)    RDW 15.8 (*)  Neutro Abs 10.5 (*)    Abs Immature Granulocytes 0.08 (*)    All other components within normal limits  TROPONIN I (HIGH SENSITIVITY) - Abnormal; Notable for the following components:   Troponin I (High Sensitivity) 98 (*)    All other  components within normal limits  TROPONIN I (HIGH SENSITIVITY) - Abnormal; Notable for the following components:   Troponin I (High Sensitivity) 109 (*)    All other components within normal limits  RESP PANEL BY RT-PCR (RSV, FLU A&B, COVID)  RVPGX2  CULTURE, BLOOD (ROUTINE X 2)  CULTURE, BLOOD (ROUTINE X 2)  LACTIC ACID, PLASMA  LACTIC ACID, PLASMA  PROTIME-INR  APTT  URINALYSIS, W/ REFLEX TO CULTURE (INFECTION SUSPECTED)      Radiology CT ABDOMEN PELVIS WO CONTRAST  Result Date: 06/08/2022 CLINICAL DATA:  Altered mental status, recent left leg stent, sacral wound EXAM: CT ABDOMEN AND PELVIS WITHOUT CONTRAST TECHNIQUE: Multidetector CT imaging of the abdomen and pelvis was performed following the standard protocol without IV contrast. RADIATION DOSE REDUCTION: This exam was performed according to the departmental dose-optimization program which includes automated exposure control, adjustment of the mA and/or kV according to patient size and/or use of iterative reconstruction technique. COMPARISON:  08/28/2021 FINDINGS: Lower chest: Trace bilateral pleural effusions. Hepatobiliary: 10 mm cyst in the central right hepatic lobe (series 3/image 26), benign. Status post cholecystectomy. No intrahepatic or extrahepatic ductal dilatation. Pancreas: Within normal limits. Spleen: Within normal limits. Adrenals/Urinary Tract: Adrenal glands are within normal limits. Kidneys are within normal limits. No renal calculi or hydronephrosis. Bladder is within normal limits. 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 No evidence of bowel obstruction. Appendix is not discretely visualized. Prior left hemicolectomy with suture line in the lower pelvis (series 3/image 63). No colonic wall thickening or inflammatory changes. Vascular/Lymphatic: No evidence of abdominal aortic aneurysm. Atherosclerotic calcifications of the abdominal aorta and branch  vessels. No suspicious abdominopelvic lymphadenopathy. Reproductive: The uterus is within normal limits. No adnexal masses. Other: Moderate right paramidline periumbilical hernia containing multiple nondilated loops of small bowel (series 3/image 44), without associated fluid or inflammatory changes. Small left paramidline periumbilical hernia containing fat (series 3/image 48), without fluid or associated inflammatory changes. Tiny fat containing right inguinal hernia (series 3/image 35). No abdominopelvic ascites. Musculoskeletal: Mild anterior wedging of the T11 vertebral body (sagittal image 93), chronic. Mild degenerative changes of the visualized thoracolumbar spine. IMPRESSION: No acute findings in the abdomen/pelvis. Trace bilateral pleural effusions. Additional postsurgical and ancillary findings as above. Electronically Signed   By: Julian Hy M.D.   On: 06/08/2022 01:50   DG Abdomen 1 View  Result Date: 06/08/2022 CLINICAL DATA:  Abdominal distension EXAM: ABDOMEN - 1 VIEW COMPARISON:  07/06/2020 FINDINGS: Gastrojejunal catheter is noted in satisfactory position. Scattered large and small bowel gas is noted. No obstructive changes are seen. No free air is noted. Bony structures are within normal limits. IMPRESSION: No acute abnormality noted. Electronically Signed   By: Inez Catalina M.D.   On: 06/08/2022 00:10   CT Head Wo Contrast  Result Date: 06/08/2022 CLINICAL DATA:  Altered mental status EXAM: CT HEAD WITHOUT CONTRAST TECHNIQUE: Contiguous axial images were obtained from the base of the skull through the vertex without intravenous contrast. RADIATION DOSE REDUCTION: This exam was performed according to the departmental dose-optimization program which includes automated exposure control, adjustment of the mA and/or kV according to patient size and/or use of iterative reconstruction technique. COMPARISON:  02/14/2021 FINDINGS: Brain: No evidence of acute infarction, hemorrhage,  hydrocephalus, extra-axial collection or mass lesion/mass effect. Chronic atrophic and ischemic changes are noted. Vascular: No hyperdense vessel or unexpected calcification. Skull: Normal. Negative for fracture or focal lesion. Sinuses/Orbits: No acute finding. Other: None. IMPRESSION: Chronic atrophic and ischemic changes without acute abnormality. Electronically Signed   By: Inez Catalina M.D.   On: 06/08/2022 00:06   DG Chest Port 1 View  Result Date: 06/12/2022 CLINICAL DATA:  Possible sepsis EXAM: PORTABLE CHEST 1 VIEW COMPARISON:  08/29/2021 FINDINGS: Cardiac shadow is enlarged but stable. Aortic calcifications are noted. Lungs are clear bilaterally. No bony abnormality is noted. IMPRESSION: No active disease. Electronically Signed   By: Inez Catalina M.D.   On: 06/23/2022 23:16    Procedures .Critical Care  Performed by: Marcello Fennel, PA-C Authorized by: Marcello Fennel, PA-C   Critical care provider statement:    Critical care time (minutes):  30   Critical care time was exclusive of:  Separately billable procedures and treating other patients   Critical care was necessary to treat or prevent imminent or life-threatening deterioration of the following conditions:  Sepsis   Critical care was time spent personally by me on the following activities:  Discussions with consultants, evaluation of patient's response to treatment, examination of patient, ordering and review of laboratory studies, ordering and review of radiographic studies, ordering and performing treatments and interventions, pulse oximetry, re-evaluation of patient's condition and review of old charts   I assumed direction of critical care for this patient from another provider in my specialty: no     Care discussed with: admitting provider       Medications Ordered in ED Medications  lactated ringers infusion ( Intravenous New Bag/Given 06/08/22 0019)  HYDROmorphone (DILAUDID) injection 0.5 mg (0.5 mg Intravenous  Given 06/08/22 0328)  oxyCODONE (Oxy IR/ROXICODONE) immediate release tablet 5 mg (has no administration in time range)  acetaminophen (TYLENOL) tablet 650 mg (has no administration in time range)  polyethylene glycol (MIRALAX / GLYCOLAX) packet 17 g (has no administration in time range)  sodium chloride 0.9 % bolus 500 mL (0 mLs Intravenous Stopped 06/08/22 0019)  metroNIDAZOLE (FLAGYL) IVPB 500 mg (0 mg Intravenous Stopped 06/08/22 0217)  vancomycin (VANCOCIN) IVPB 1000 mg/200 mL premix (0 mg Intravenous Stopped 06/08/22 0317)  ceFEPIme (MAXIPIME) 2 g in sodium chloride 0.9 % 100 mL IVPB (0 g Intravenous Stopped 06/08/22 0140)  acetaminophen (TYLENOL) tablet 650 mg (650 mg Oral Given 06/08/22 0057)  HYDROmorphone (DILAUDID) injection 0.5 mg (0.5 mg Intravenous Given 06/08/22 0105)    ED Course/ Medical Decision Making/ A&P                             Medical Decision Making Amount and/or Complexity of Data Reviewed Labs: ordered. Radiology: ordered. ECG/medicine tests: ordered.  Risk OTC drugs. Prescription drug management. Decision regarding hospitalization.   This patient presents to the ED for concern of fever, this involves an extensive number of treatment options, and is a complaint that carries with it a high risk of complications and morbidity.  The differential diagnosis includes sepsis, intra-abdominal infection, pneumonia, UTI, postop infection of the left lower extremity    Additional history obtained:  Additional history obtained from daughter External records from outside source obtained and reviewed including vascular notes   Co morbidities that complicate the patient evaluation  PAD, atrial fibrillation  Social Determinants of Health:  Geriatric  Lab Tests:  I Ordered, and personally interpreted labs.  The pertinent results include: Leukocytosis of 12.5, macrocytic anemia hemoglobin 8.3, CMP reveals sodium 131, potassium 5.2, glucose 131, BUN 75, creatinine 2.5,  GFR 17, prothrombin time INR unremarkable APTT unremarkable, respiratory panel negative, first troponin is 97 secondary points 109, lactic is 1.8   Imaging Studies ordered:  I ordered imaging studies including chest x-ray I independently visualized and interpreted imaging which showed chest x-ray, abdomen both negative acute findings, CT head, CT and pelvis both negative for acute findings. I agree with the radiologist interpretation   Cardiac Monitoring:  The patient was maintained on a cardiac monitor.  I personally viewed and interpreted the cardiac monitored which showed an underlying rhythm of: Sinus not signs of ischemia   Medicines ordered and prescription drug management:  I ordered medication including for fluids I have reviewed the patients home medicines and have made adjustments as needed  Critical Interventions:  Presents septic on arrival, unclear etiology, will start on fluids, antibiotics obtain sepsis workup and continue to monitor. Reassessed the patient resting comfortably, vital signs remained stable.   Reevaluation:  Presents with sepsis, endorses generalized pain in the lower legs not feeling well, benign physical exam, will obtain basic lab work imaging and reassess.  Initial troponin is 97, not endorse any chest pain at this time, EKG without signs of ischemia will continue to monitor  Reassessed resting comfortably she is agreement with admission at this time.    Consultations Obtained:  I requested consultation with the Dr. Nevada Crane hospitalist,  and discussed lab and imaging findings as well as pertinent plan - they recommend: Will admit the patient    Test Considered:  N/a    Rule out I have low suspicion for ACS as history is atypical,, EKG was sinus rhythm without signs of ischemia, troponins plateaued.  They are slightly elevated, but I suspect this is multifactorial, likely demand ischemia from dehydration as well as poor creatinine  clearance.  Would recommend continue monitoring, consult cardiology if significant elevation.  Low suspicion for PE denies pleuritic chest pain, no new oxygen requirements, nontachycardic presentation seems atypical.  Suspicion for limb ischemia is also low at this time both limbs are warm to the touch, Doppler pedal pulses bilaterally presentation seems atypical.  DVT is also low at this time she has no calf tenderness, she has slight swelling in her left calf but this could be secondary due to recent stents, would recommend further evaluation DVT study.       Dispostion and problem list  After consideration of the diagnostic results and the patients response to treatment, I feel that the patent would benefit from admission.  Sepsis-unclear source, started on broad-spectrum antibiotics, will need continued monitoring likely downgrading and tailoring antibiotics. Uptrending troponins-I suspect this is likely demand ischemia from dehydration and worsening creatinine clearance, but I would recommend continue monitoring it, and may continue to elevate would recommend heart consultation Left leg swelling-very minimally swelling on my exam but I would further evaluate with DVT study in the morning.  Possible consult vascular for further recommendations.            Final Clinical Impression(s) / ED Diagnoses Final diagnoses:  Sepsis, due to unspecified organism, unspecified whether acute organ dysfunction present [facility])  AKI (acute kidney injury) [facility])    Rx / DC Orders ED Discharge Orders     None         Marcello Fennel, PA-C 06/08/22 [id number]  Dorie Rank, MD 06/09/22 [phone]

## 2022-06-08 ENCOUNTER — Inpatient Hospital Stay (HOSPITAL_COMMUNITY): Payer: Medicare Other

## 2022-06-08 ENCOUNTER — Emergency Department (HOSPITAL_COMMUNITY): Payer: Medicare Other

## 2022-06-08 DIAGNOSIS — N179 Acute kidney failure, unspecified: Secondary | ICD-10-CM

## 2022-06-08 DIAGNOSIS — A419 Sepsis, unspecified organism: Secondary | ICD-10-CM | POA: Diagnosis present

## 2022-06-08 DIAGNOSIS — J9811 Atelectasis: Secondary | ICD-10-CM | POA: Diagnosis present

## 2022-06-08 DIAGNOSIS — M7989 Other specified soft tissue disorders: Secondary | ICD-10-CM | POA: Diagnosis not present

## 2022-06-08 DIAGNOSIS — I480 Paroxysmal atrial fibrillation: Secondary | ICD-10-CM | POA: Diagnosis present

## 2022-06-08 DIAGNOSIS — E43 Unspecified severe protein-calorie malnutrition: Secondary | ICD-10-CM | POA: Diagnosis present

## 2022-06-08 DIAGNOSIS — I509 Heart failure, unspecified: Secondary | ICD-10-CM | POA: Diagnosis not present

## 2022-06-08 DIAGNOSIS — J9601 Acute respiratory failure with hypoxia: Secondary | ICD-10-CM | POA: Diagnosis not present

## 2022-06-08 DIAGNOSIS — J690 Pneumonitis due to inhalation of food and vomit: Secondary | ICD-10-CM | POA: Diagnosis not present

## 2022-06-08 DIAGNOSIS — Z515 Encounter for palliative care: Secondary | ICD-10-CM | POA: Diagnosis not present

## 2022-06-08 DIAGNOSIS — R7989 Other specified abnormal findings of blood chemistry: Secondary | ICD-10-CM | POA: Diagnosis not present

## 2022-06-08 DIAGNOSIS — Z95820 Peripheral vascular angioplasty status with implants and grafts: Secondary | ICD-10-CM | POA: Diagnosis not present

## 2022-06-08 DIAGNOSIS — L03116 Cellulitis of left lower limb: Secondary | ICD-10-CM | POA: Diagnosis present

## 2022-06-08 DIAGNOSIS — I10 Essential (primary) hypertension: Secondary | ICD-10-CM | POA: Diagnosis not present

## 2022-06-08 DIAGNOSIS — E860 Dehydration: Secondary | ICD-10-CM | POA: Diagnosis present

## 2022-06-08 DIAGNOSIS — D631 Anemia in chronic kidney disease: Secondary | ICD-10-CM | POA: Diagnosis present

## 2022-06-08 DIAGNOSIS — Z7189 Other specified counseling: Secondary | ICD-10-CM | POA: Diagnosis not present

## 2022-06-08 DIAGNOSIS — I517 Cardiomegaly: Secondary | ICD-10-CM | POA: Diagnosis not present

## 2022-06-08 DIAGNOSIS — Y92239 Unspecified place in hospital as the place of occurrence of the external cause: Secondary | ICD-10-CM | POA: Diagnosis present

## 2022-06-08 DIAGNOSIS — R0602 Shortness of breath: Secondary | ICD-10-CM | POA: Diagnosis not present

## 2022-06-08 DIAGNOSIS — K7689 Other specified diseases of liver: Secondary | ICD-10-CM | POA: Diagnosis not present

## 2022-06-08 DIAGNOSIS — R638 Other symptoms and signs concerning food and fluid intake: Secondary | ICD-10-CM | POA: Diagnosis not present

## 2022-06-08 DIAGNOSIS — Z789 Other specified health status: Secondary | ICD-10-CM | POA: Diagnosis not present

## 2022-06-08 DIAGNOSIS — I130 Hypertensive heart and chronic kidney disease with heart failure and stage 1 through stage 4 chronic kidney disease, or unspecified chronic kidney disease: Secondary | ICD-10-CM | POA: Diagnosis present

## 2022-06-08 DIAGNOSIS — Z711 Person with feared health complaint in whom no diagnosis is made: Secondary | ICD-10-CM | POA: Diagnosis not present

## 2022-06-08 DIAGNOSIS — E1142 Type 2 diabetes mellitus with diabetic polyneuropathy: Secondary | ICD-10-CM | POA: Diagnosis present

## 2022-06-08 DIAGNOSIS — F32A Depression, unspecified: Secondary | ICD-10-CM | POA: Diagnosis present

## 2022-06-08 DIAGNOSIS — Z66 Do not resuscitate: Secondary | ICD-10-CM | POA: Diagnosis present

## 2022-06-08 DIAGNOSIS — J90 Pleural effusion, not elsewhere classified: Secondary | ICD-10-CM | POA: Diagnosis not present

## 2022-06-08 DIAGNOSIS — E1122 Type 2 diabetes mellitus with diabetic chronic kidney disease: Secondary | ICD-10-CM | POA: Diagnosis present

## 2022-06-08 DIAGNOSIS — N178 Other acute kidney failure: Secondary | ICD-10-CM | POA: Diagnosis present

## 2022-06-08 DIAGNOSIS — I5033 Acute on chronic diastolic (congestive) heart failure: Secondary | ICD-10-CM | POA: Diagnosis not present

## 2022-06-08 DIAGNOSIS — N1832 Chronic kidney disease, stage 3b: Secondary | ICD-10-CM | POA: Diagnosis present

## 2022-06-08 DIAGNOSIS — Z1152 Encounter for screening for COVID-19: Secondary | ICD-10-CM | POA: Diagnosis not present

## 2022-06-08 DIAGNOSIS — I2489 Other forms of acute ischemic heart disease: Secondary | ICD-10-CM | POA: Diagnosis present

## 2022-06-08 DIAGNOSIS — E039 Hypothyroidism, unspecified: Secondary | ICD-10-CM | POA: Diagnosis present

## 2022-06-08 DIAGNOSIS — L97519 Non-pressure chronic ulcer of other part of right foot with unspecified severity: Secondary | ICD-10-CM | POA: Diagnosis present

## 2022-06-08 DIAGNOSIS — R140 Abdominal distension (gaseous): Secondary | ICD-10-CM | POA: Diagnosis not present

## 2022-06-08 DIAGNOSIS — Z8616 Personal history of COVID-19: Secondary | ICD-10-CM | POA: Diagnosis not present

## 2022-06-08 LAB — COMPREHENSIVE METABOLIC PANEL
ALT: 18 U/L (ref 0–44)
ALT: 22 U/L (ref 0–44)
AST: 28 U/L (ref 15–41)
AST: 27 U/L (ref 15–41)
Albumin: 2.8 g/dL — ABNORMAL LOW (ref 3.5–5.0)
Albumin: 2.6 g/dL — ABNORMAL LOW (ref 3.5–5.0)
Alkaline Phosphatase: 75 U/L (ref 38–126)
Alkaline Phosphatase: 72 U/L (ref 38–126)
Anion gap: 9 (ref 5–15)
Anion gap: 8 (ref 5–15)
BUN: 75 mg/dL — ABNORMAL HIGH (ref 8–23)
BUN: 76 mg/dL — ABNORMAL HIGH (ref 8–23)
CO2: 22 mmol/L (ref 22–32)
CO2: 24 mmol/L (ref 22–32)
Calcium: 8.7 mg/dL — ABNORMAL LOW (ref 8.9–10.3)
Calcium: 8.6 mg/dL — ABNORMAL LOW (ref 8.9–10.3)
Chloride: 100 mmol/L (ref 98–111)
Chloride: 100 mmol/L (ref 98–111)
Creatinine, Ser: 2.54 mg/dL — ABNORMAL HIGH (ref 0.44–1.00)
Creatinine, Ser: 2.58 mg/dL — ABNORMAL HIGH (ref 0.44–1.00)
GFR, Estimated: 17 mL/min — ABNORMAL LOW (ref >60)
GFR, Estimated: 17 mL/min — ABNORMAL LOW (ref >60)
Glucose, Bld: 131 mg/dL — ABNORMAL HIGH (ref 70–99)
Glucose, Bld: 120 mg/dL — ABNORMAL HIGH (ref 70–99)
Potassium: 5.2 mmol/L — ABNORMAL HIGH (ref 3.5–5.1)
Potassium: 4.7 mmol/L (ref 3.5–5.1)
Sodium: 131 mmol/L — ABNORMAL LOW (ref 135–145)
Sodium: 132 mmol/L — ABNORMAL LOW (ref 135–145)
Total Bilirubin: 0.7 mg/dL (ref 0.3–1.2)
Total Bilirubin: 0.7 mg/dL (ref 0.3–1.2)
Total Protein: 7.3 g/dL (ref 6.5–8.1)
Total Protein: 7.1 g/dL (ref 6.5–8.1)

## 2022-06-08 LAB — CBC
HCT: 22.4 % — ABNORMAL LOW (ref 36.0–46.0)
HCT: 25 % — ABNORMAL LOW (ref 36.0–46.0)
Hemoglobin: 7.3 g/dL — ABNORMAL LOW (ref 12.0–15.0)
Hemoglobin: 8.4 g/dL — ABNORMAL LOW (ref 12.0–15.0)
MCH: 32.6 pg (ref 26.0–34.0)
MCH: 33.1 pg (ref 26.0–34.0)
MCHC: 32.6 g/dL (ref 30.0–36.0)
MCHC: 33.6 g/dL (ref 30.0–36.0)
MCV: 100 fL (ref 80.0–100.0)
MCV: 98.4 fL (ref 80.0–100.0)
Platelets: 174 10*3/uL (ref 150–400)
Platelets: 189 10*3/uL (ref 150–400)
RBC: 2.24 MIL/uL — ABNORMAL LOW (ref 3.87–5.11)
RBC: 2.54 MIL/uL — ABNORMAL LOW (ref 3.87–5.11)
RDW: 15.9 % — ABNORMAL HIGH (ref 11.5–15.5)
RDW: 15.7 % — ABNORMAL HIGH (ref 11.5–15.5)
WBC: 9 10*3/uL (ref 4.0–10.5)
WBC: 10.3 10*3/uL (ref 4.0–10.5)
nRBC: 0 % (ref 0.0–0.2)
nRBC: 0 % (ref 0.0–0.2)

## 2022-06-08 LAB — ECHOCARDIOGRAM COMPLETE
Area-P 1/2: 4.08 cm2
Height: 66 in
P 1/2 time: 467 msec
S' Lateral: 2.7 cm
Weight: 2080 oz

## 2022-06-08 LAB — RESP PANEL BY RT-PCR (RSV, FLU A&B, COVID)  RVPGX2
Influenza A by PCR: NEGATIVE
Influenza B by PCR: NEGATIVE
Resp Syncytial Virus by PCR: NEGATIVE
SARS Coronavirus 2 by RT PCR: NEGATIVE

## 2022-06-08 LAB — TROPONIN I (HIGH SENSITIVITY)
Troponin I (High Sensitivity): 98 ng/L — ABNORMAL HIGH (ref <18)
Troponin I (High Sensitivity): 109 ng/L (ref <18)
Troponin I (High Sensitivity): 149 ng/L (ref <18)
Troponin I (High Sensitivity): 159 ng/L (ref <18)

## 2022-06-08 LAB — LACTIC ACID, PLASMA: Lactic Acid, Venous: 1.8 mmol/L (ref 0.5–1.9)

## 2022-06-08 LAB — MAGNESIUM: Magnesium: 2.1 mg/dL (ref 1.7–2.4)

## 2022-06-08 LAB — PHOSPHORUS: Phosphorus: 4 mg/dL (ref 2.5–4.6)

## 2022-06-08 LAB — D-DIMER, QUANTITATIVE: D-Dimer, Quant: 2 ug/mL-FEU — ABNORMAL HIGH (ref 0.00–0.50)

## 2022-06-08 LAB — GLUCOSE, CAPILLARY
Glucose-Capillary: 112 mg/dL — ABNORMAL HIGH (ref 70–99)
Glucose-Capillary: 121 mg/dL — ABNORMAL HIGH (ref 70–99)

## 2022-06-08 LAB — HEPARIN LEVEL (UNFRACTIONATED): Heparin Unfractionated: <0.1 IU/mL — ABNORMAL LOW (ref 0.30–0.70)

## 2022-06-08 MED ORDER — OXYCODONE HCL 5 MG PO TABS: 5.0000 mg | ORAL_TABLET | Freq: Four times a day (QID) | ORAL | Status: DC | PRN

## 2022-06-08 MED ORDER — LATANOPROST 0.005 % OP SOLN
1.0000 [drp] | Freq: Every day | OPHTHALMIC | Status: DC
  Administered 2022-06-08 – 2022-06-14 (×7): 1 [drp] via OPHTHALMIC
  Filled 2022-06-08: qty 2.5

## 2022-06-08 MED ORDER — GABAPENTIN 250 MG/5ML PO SOLN
300.0000 mg | Freq: Every day | ORAL | Status: DC
  Administered 2022-06-09 – 2022-06-14 (×6): 300 mg
  Filled 2022-06-08 (×8): qty 6

## 2022-06-08 MED ORDER — CYCLOSPORINE 0.05 % OP EMUL
1.0000 [drp] | Freq: Two times a day (BID) | OPHTHALMIC | Status: DC
  Administered 2022-06-08 – 2022-06-15 (×15): 1 [drp] via OPHTHALMIC
  Filled 2022-06-08 (×17): qty 30

## 2022-06-08 MED ORDER — HYDROMORPHONE HCL 1 MG/ML IJ SOLN
0.5000 mg | INTRAMUSCULAR | Status: DC | PRN
  Administered 2022-06-08 (×2): 0.5 mg via INTRAVENOUS
  Filled 2022-06-08: qty 0.5
  Filled 2022-06-08: qty 1

## 2022-06-08 MED ORDER — METRONIDAZOLE 500 MG/100ML IV SOLN
500.0000 mg | Freq: Once | INTRAVENOUS | Status: AC
  Administered 2022-06-08: 500 mg via INTRAVENOUS
  Filled 2022-06-08: qty 100

## 2022-06-08 MED ORDER — HEPARIN (PORCINE) 25000 UT/250ML-% IV SOLN
700.0000 [IU]/h | INTRAVENOUS | Status: DC
  Administered 2022-06-08: 700 [IU]/h via INTRAVENOUS
  Filled 2022-06-08: qty 250

## 2022-06-08 MED ORDER — POLYETHYLENE GLYCOL 3350 17 GM/SCOOP PO POWD
17.0000 g | Freq: Every day | ORAL | Status: DC
  Filled 2022-06-08: qty 255

## 2022-06-08 MED ORDER — LORATADINE 10 MG PO TABS
10.0000 mg | ORAL_TABLET | Freq: Every evening | ORAL | Status: DC
  Administered 2022-06-09 – 2022-06-13 (×5): 10 mg
  Filled 2022-06-08 (×5): qty 1

## 2022-06-08 MED ORDER — AMLODIPINE BESYLATE 5 MG PO TABS
5.0000 mg | ORAL_TABLET | Freq: Every day | ORAL | Status: DC
  Administered 2022-06-08 – 2022-06-09 (×2): 5 mg via ORAL
  Filled 2022-06-08 (×2): qty 1

## 2022-06-08 MED ORDER — FLUTICASONE PROPIONATE 50 MCG/ACT NA SUSP
2.0000 | Freq: Every day | NASAL | Status: DC
  Administered 2022-06-08 – 2022-06-13 (×6): 2 via NASAL
  Filled 2022-06-08: qty 16

## 2022-06-08 MED ORDER — CLOPIDOGREL BISULFATE 75 MG PO TABS
75.0000 mg | ORAL_TABLET | Freq: Every day | ORAL | Status: DC
  Administered 2022-06-09 – 2022-06-15 (×7): 75 mg
  Filled 2022-06-08 (×7): qty 1

## 2022-06-08 MED ORDER — SODIUM CHLORIDE 0.9 % IV SOLN: 2.0000 g | INTRAVENOUS | Status: DC

## 2022-06-08 MED ORDER — ACETAMINOPHEN 325 MG PO TABS
650.0000 mg | ORAL_TABLET | Freq: Once | ORAL | Status: AC
  Administered 2022-06-08: 650 mg via ORAL
  Filled 2022-06-08: qty 2

## 2022-06-08 MED ORDER — VANCOMYCIN HCL IN DEXTROSE 1-5 GM/200ML-% IV SOLN
1000.0000 mg | Freq: Once | INTRAVENOUS | Status: AC
  Administered 2022-06-08: 1000 mg via INTRAVENOUS
  Filled 2022-06-08: qty 200

## 2022-06-08 MED ORDER — HYDROMORPHONE HCL 1 MG/ML IJ SOLN
0.5000 mg | INTRAMUSCULAR | Status: DC | PRN
  Administered 2022-06-12 – 2022-06-13 (×2): 1 mg via INTRAVENOUS
  Filled 2022-06-08 (×2): qty 1

## 2022-06-08 MED ORDER — ATORVASTATIN CALCIUM 40 MG PO TABS
40.0000 mg | ORAL_TABLET | Freq: Every day | ORAL | Status: DC
  Administered 2022-06-08: 40 mg via ORAL
  Filled 2022-06-08: qty 1

## 2022-06-08 MED ORDER — ATORVASTATIN CALCIUM 40 MG PO TABS
40.0000 mg | ORAL_TABLET | Freq: Every day | ORAL | Status: DC
  Administered 2022-06-09 – 2022-06-15 (×7): 40 mg
  Filled 2022-06-08 (×7): qty 1

## 2022-06-08 MED ORDER — POLYETHYLENE GLYCOL 3350 17 G PO PACK
17.0000 g | PACK | Freq: Every day | ORAL | Status: DC
  Administered 2022-06-10: 17 g
  Filled 2022-06-08 (×3): qty 1

## 2022-06-08 MED ORDER — SODIUM CHLORIDE 0.9 % IV SOLN
3.0000 g | Freq: Two times a day (BID) | INTRAVENOUS | Status: AC
  Administered 2022-06-08 – 2022-06-12 (×9): 3 g via INTRAVENOUS
  Filled 2022-06-08 (×10): qty 8

## 2022-06-08 MED ORDER — LEVOTHYROXINE SODIUM 100 MCG PO TABS
100.0000 ug | ORAL_TABLET | Freq: Every day | ORAL | Status: DC
  Administered 2022-06-10 – 2022-06-15 (×6): 100 ug
  Filled 2022-06-08 (×7): qty 1

## 2022-06-08 MED ORDER — OSMOLITE 1.2 CAL PO LIQD: 1000.0000 mL | ORAL | Status: DC

## 2022-06-08 MED ORDER — ASPIRIN 81 MG PO CHEW
81.0000 mg | CHEWABLE_TABLET | Freq: Every day | ORAL | Status: DC
  Administered 2022-06-09 – 2022-06-15 (×7): 81 mg
  Filled 2022-06-08 (×8): qty 1

## 2022-06-08 MED ORDER — SODIUM ZIRCONIUM CYCLOSILICATE 10 G PO PACK: 10.0000 g | PACK | Freq: Once | ORAL | Status: DC

## 2022-06-08 MED ORDER — FREE WATER
180.0000 mL | Freq: Three times a day (TID) | Status: DC
  Administered 2022-06-08 – 2022-06-09 (×3): 180 mL

## 2022-06-08 MED ORDER — GABAPENTIN 300 MG PO CAPS
300.0000 mg | ORAL_CAPSULE | Freq: Every day | ORAL | Status: DC
  Administered 2022-06-08: 300 mg via ORAL
  Filled 2022-06-08: qty 1

## 2022-06-08 MED ORDER — ARTIFICIAL TEARS OPHTHALMIC OINT
TOPICAL_OINTMENT | Freq: Every day | OPHTHALMIC | Status: DC
  Filled 2022-06-08: qty 3.5

## 2022-06-08 MED ORDER — SYSTANE NIGHTTIME OP OINT: 1.0000 "application " | TOPICAL_OINTMENT | Freq: Every day | OPHTHALMIC | Status: DC

## 2022-06-08 MED ORDER — LEVOTHYROXINE SODIUM 100 MCG PO TABS
100.0000 ug | ORAL_TABLET | Freq: Every day | ORAL | Status: DC
  Administered 2022-06-08: 100 ug via ORAL
  Filled 2022-06-08: qty 1

## 2022-06-08 MED ORDER — PANTOPRAZOLE SODIUM 40 MG IV SOLR
40.0000 mg | Freq: Two times a day (BID) | INTRAVENOUS | Status: DC
  Administered 2022-06-08 – 2022-06-15 (×15): 40 mg via INTRAVENOUS
  Filled 2022-06-08 (×15): qty 10

## 2022-06-08 MED ORDER — PANTOPRAZOLE SODIUM 40 MG IV SOLR: 40.0000 mg | Freq: Every day | INTRAVENOUS | Status: DC

## 2022-06-08 MED ORDER — POLYVINYL ALCOHOL 1.4 % OP SOLN
1.0000 [drp] | Freq: Four times a day (QID) | OPHTHALMIC | Status: DC
  Administered 2022-06-08 – 2022-06-15 (×22): 1 [drp] via OPHTHALMIC
  Filled 2022-06-08 (×2): qty 15

## 2022-06-08 MED ORDER — CLOPIDOGREL BISULFATE 75 MG PO TABS
75.0000 mg | ORAL_TABLET | Freq: Every day | ORAL | Status: DC
  Administered 2022-06-08: 75 mg via ORAL
  Filled 2022-06-08: qty 1

## 2022-06-08 MED ORDER — OSMOLITE 1.5 CAL PO LIQD
660.0000 mL | ORAL | Status: DC
  Administered 2022-06-08: 660 mL via JEJUNOSTOMY
  Filled 2022-06-08 (×2): qty 711

## 2022-06-08 MED ORDER — VANCOMYCIN HCL 750 MG/150ML IV SOLN
750.0000 mg | INTRAVENOUS | Status: DC
  Administered 2022-06-10: 750 mg via INTRAVENOUS
  Filled 2022-06-08: qty 150

## 2022-06-08 MED ORDER — ASPIRIN 81 MG PO TBEC
81.0000 mg | DELAYED_RELEASE_TABLET | Freq: Every day | ORAL | Status: DC
  Administered 2022-06-08: 81 mg via ORAL
  Filled 2022-06-08: qty 1

## 2022-06-08 MED ORDER — IPRATROPIUM-ALBUTEROL 0.5-2.5 (3) MG/3ML IN SOLN
3.0000 mL | RESPIRATORY_TRACT | Status: DC | PRN
  Administered 2022-06-08 – 2022-06-12 (×4): 3 mL via RESPIRATORY_TRACT
  Filled 2022-06-08 (×4): qty 3

## 2022-06-08 MED ORDER — SODIUM CHLORIDE 0.9 % IV SOLN
2.0000 g | Freq: Once | INTRAVENOUS | Status: AC
  Administered 2022-06-08: 2 g via INTRAVENOUS
  Filled 2022-06-08: qty 12.5

## 2022-06-08 MED ORDER — POLYETHYLENE GLYCOL 3350 17 GM/SCOOP PO POWD
17.0000 g | Freq: Every day | ORAL | Status: DC
  Administered 2022-06-08: 17 g via ORAL
  Filled 2022-06-08: qty 255

## 2022-06-08 MED ORDER — METRONIDAZOLE 500 MG/100ML IV SOLN
500.0000 mg | Freq: Two times a day (BID) | INTRAVENOUS | Status: DC
  Administered 2022-06-08: 500 mg via INTRAVENOUS
  Filled 2022-06-08: qty 100

## 2022-06-08 MED ORDER — LACTATED RINGERS IV SOLN: INTRAVENOUS | Status: AC

## 2022-06-08 MED ORDER — ACETAMINOPHEN 325 MG PO TABS: 650.0000 mg | ORAL_TABLET | Freq: Four times a day (QID) | ORAL | Status: DC | PRN

## 2022-06-08 MED ORDER — OSMOLITE 1.5 CAL PO LIQD: 780.0000 mL | ORAL | Status: DC

## 2022-06-08 MED ORDER — MOMETASONE FURO-FORMOTEROL FUM 200-5 MCG/ACT IN AERO
2.0000 | INHALATION_SPRAY | Freq: Two times a day (BID) | RESPIRATORY_TRACT | Status: DC
  Administered 2022-06-08 – 2022-06-14 (×8): 2 via RESPIRATORY_TRACT
  Filled 2022-06-08: qty 8.8

## 2022-06-08 MED ORDER — HEPARIN BOLUS VIA INFUSION
2000.0000 [IU] | Freq: Once | INTRAVENOUS | Status: AC
  Administered 2022-06-08: 2000 [IU] via INTRAVENOUS
  Filled 2022-06-08: qty 2000

## 2022-06-08 MED ORDER — ADULT MULTIVITAMIN W/MINERALS CH
1.0000 | ORAL_TABLET | Freq: Every day | ORAL | Status: DC
  Administered 2022-06-09 – 2022-06-15 (×7): 1
  Filled 2022-06-08 (×7): qty 1

## 2022-06-08 MED ORDER — FERROUS SULFATE 300 (60 FE) MG/5ML PO SOLN
300.0000 mg | Freq: Every day | ORAL | Status: DC
  Administered 2022-06-09 – 2022-06-15 (×7): 300 mg
  Filled 2022-06-08 (×7): qty 5

## 2022-06-08 MED ORDER — LORATADINE 10 MG PO TABS
10.0000 mg | ORAL_TABLET | Freq: Every evening | ORAL | Status: DC
  Administered 2022-06-08: 10 mg via ORAL
  Filled 2022-06-08: qty 1

## 2022-06-08 MED ORDER — DULOXETINE HCL 60 MG PO CPEP
60.0000 mg | ORAL_CAPSULE | Freq: Every day | ORAL | Status: DC
  Administered 2022-06-08: 60 mg via ORAL
  Filled 2022-06-08: qty 1

## 2022-06-08 MED ORDER — FERROUS SULFATE 325 (65 FE) MG PO TABS
325.0000 mg | ORAL_TABLET | Freq: Every day | ORAL | Status: DC
  Administered 2022-06-08: 325 mg via ORAL
  Filled 2022-06-08: qty 1

## 2022-06-08 MED ORDER — HYDROMORPHONE HCL 1 MG/ML IJ SOLN
0.5000 mg | Freq: Once | INTRAMUSCULAR | Status: AC
  Administered 2022-06-08: 0.5 mg via INTRAVENOUS
  Filled 2022-06-08: qty 1

## 2022-06-08 MED ORDER — ADULT MULTIVITAMIN W/MINERALS CH
1.0000 | ORAL_TABLET | Freq: Every day | ORAL | Status: DC
  Administered 2022-06-08: 1 via ORAL
  Filled 2022-06-08: qty 1

## 2022-06-08 MED ORDER — OXYCODONE HCL 5 MG PO TABS
5.0000 mg | ORAL_TABLET | Freq: Four times a day (QID) | ORAL | Status: DC | PRN
  Administered 2022-06-08: 5 mg via ORAL
  Filled 2022-06-08: qty 1

## 2022-06-08 MED ORDER — POLYETHYLENE GLYCOL 3350 17 G PO PACK: 17.0000 g | PACK | Freq: Every day | ORAL | Status: DC | PRN

## 2022-06-08 NOTE — Evaluation (Signed)
Clinical/Bedside Swallow Evaluation Patient Details  Name: Shannon [name] MRN: [id number] Date of Birth: [birth year]  Today's Date: 06/08/2022 Time: SLP Start Time (ACUTE ONLY): 40 SLP Stop Time (ACUTE ONLY): [id number] SLP Time Calculation (min) (ACUTE ONLY): 10 min  Past Medical History:  Past Medical History:  Diagnosis Date   Acute respiratory failure ([place])    Anemia    previous blood transfusions   Arthritis    "all over"   Asthma    Atrial flutter (HCC)    Bradycardia    requiring previous d/c of BB and reduction of amiodarone   CAD (coronary artery disease)    nonobstructive per notes   Chronic diastolic CHF (congestive heart failure) ([place])    CKD (chronic kidney disease), stage III ([place])    Complication of blood transfusion    "got the wrong blood type at [facility] in ~ 2015; no adverse reaction that we are aware of"/daughter, [name] (01/27/2016)   COPD (chronic obstructive pulmonary disease) ([place])    Depression    "light case"   DVT (deep venous thrombosis) ([place]) 01/2016   a. LLE DVT 01/2016 - switched from Eliquis to Coumadin.   Dyspnea    with some activity   Gastric stenosis    a. s/p stomach tube   GERD (gastroesophageal reflux disease)    GI bleed    Headache    History of 2019 novel coronavirus disease (COVID-19)    History of blood transfusion    "several" (01/27/2016)   History of stomach ulcers    Hyperlipidemia    Hypertension    Hypothyroidism    On home oxygen therapy    "2L; 9PM til 9AM" (06/27/2017)   PAD (peripheral artery disease) (HCC)    a. prior gangrene, toe amputations, intervention.   PAF (paroxysmal atrial fibrillation) ([place])    Paraesophageal hernia    Perforated gastric ulcer ([place])    Pneumonia    "a few times" (06/27/2017)   Seasonal allergies    SIADH (syndrome of inappropriate ADH production) ([place])    [name] 01/10/2015   Small bowel obstruction ([place])    "I don't know how many" (01/11/2015)   Stroke [facility])    several- no residual    Type II diabetes mellitus ([place])    "related to prednisone use  for > 20 yr; once predinose stopped; no more DM RX" (01/27/2016)   UTI (urinary tract infection) 02/08/2016   Ventral hernia with bowel obstruction    Past Surgical History:  Past Surgical History:  Procedure Laterality Date   ABDOMINAL AORTOGRAM N/A 09/21/2016   Procedure: Abdominal Aortogram;  Surgeon: Waynetta Sandy, MD;  Location: [place] [facility];  Service: Cardiovascular;  Laterality: N/A;   ABDOMINAL AORTOGRAM W/LOWER EXTREMITY N/A 10/20/2020   Procedure: ABDOMINAL AORTOGRAM W/LOWER EXTREMITY;  Surgeon: Cherre Robins, MD;  Location: [place] [facility];  Service: Cardiovascular;  Laterality: N/A;   ABDOMINAL AORTOGRAM W/LOWER EXTREMITY N/A 06/02/2022   Procedure: ABDOMINAL AORTOGRAM W/LOWER EXTREMITY;  Surgeon: Cherre Robins, MD;  Location: [place] [facility];  Service: Cardiovascular;  Laterality: N/A;   AMPUTATION Left 09/25/2016   Procedure: AMPUTATION DIGIT- LEFT 2ND AND 3RD TOES;  Surgeon: Rosetta Posner, MD;  Location: [place];  Service: Vascular;  Laterality: Left;   AMPUTATION Right 05/03/2018   Procedure: AMPUTATION RIGHT GREAT TOE;  Surgeon: Rosetta Posner, MD;  Location: [facility];  Service: Vascular;  Laterality: Right;   CATARACT EXTRACTION W/ INTRAOCULAR LENS  IMPLANT, BILATERAL  CHOLECYSTECTOMY OPEN     COLECTOMY     ESOPHAGOGASTRODUODENOSCOPY N/A 01/19/2014   Procedure: ESOPHAGOGASTRODUODENOSCOPY (EGD);  Surgeon: Irene Shipper, MD;  Location[facility];  Service: Endoscopy;  Laterality: N/A;   ESOPHAGOGASTRODUODENOSCOPY N/A 01/20/2014   Procedure: ESOPHAGOGASTRODUODENOSCOPY (EGD);  Surgeon: Irene Shipper, MD;  Location: [facility];  Service: Endoscopy;  Laterality: N/A;   ESOPHAGOGASTRODUODENOSCOPY N/A 03/19/2014   Procedure: ESOPHAGOGASTRODUODENOSCOPY (EGD);  Surgeon: Milus Banister, MD;  Location: [facility];  Service: Endoscopy;  Laterality: N/A;   ESOPHAGOGASTRODUODENOSCOPY N/A  07/08/2015   Procedure: ESOPHAGOGASTRODUODENOSCOPY (EGD);  Surgeon: Doran Stabler, MD;  Location: [facility];  Service: Endoscopy;  Laterality: N/A;   ESOPHAGOGASTRODUODENOSCOPY (EGD) WITH PROPOFOL N/A 09/15/2015   Procedure: ESOPHAGOGASTRODUODENOSCOPY (EGD) WITH PROPOFOL;  Surgeon: Manus Gunning, MD;  Location: [facility];  Service: Gastroenterology;  Laterality: N/A;   ESOPHAGOGASTRODUODENOSCOPY (EGD) WITH PROPOFOL N/A 10/12/2020   Procedure: ESOPHAGOGASTRODUODENOSCOPY (EGD) WITH PROPOFOL;  Surgeon: Gatha Mayer, MD;  Location: [place];  Service: Endoscopy;  Laterality: N/A;   FOOT SURGERY     GASTROJEJUNOSTOMY     hx/notes 01/10/2015   GASTROJEJUNOSTOMY N/A 09/23/2015   Procedure: OPEN GASTROJEJUNOSTOMY TUBE PLACEMENT;  Surgeon: Arta Bruce Kinsinger, MD;  [facility]: [facility];  Service: General;  Laterality: N/A;   GLAUCOMA SURGERY Bilateral    HEMATOMA EVACUATION Right 10/20/2020   Procedure: EVACUATION HEMATOMA RIGHT GROIN;  Surgeon: Cherre Robins, MD;  Location: [place];  Service: Vascular;  Laterality: Right;   IR CM INJ ANY COLONIC TUBE W/FLUORO  01/05/2017   IR CM INJ ANY COLONIC TUBE W/FLUORO  06/07/2017   IR CM INJ ANY COLONIC TUBE W/FLUORO  11/05/2017   IR CM INJ ANY COLONIC TUBE W/FLUORO  09/18/2018   IR CM INJ ANY COLONIC TUBE W/FLUORO  03/06/2019   IR CM INJ ANY COLONIC TUBE W/FLUORO  03/11/2019   IR GASTR TUBE CONVERT GASTR-JEJ PER W/FL MOD SED  04/30/2020   IR GENERIC HISTORICAL  01/07/2016   IR [place] TUBE CHANGE 01/07/2016 Jacqulynn Cadet, MD [facility]   IR GENERIC HISTORICAL  01/27/2016   IR MECH REMOV OBSTRUC MAT ANY COLON TUBE W/FLUORO 01/27/2016 Markus Daft, MD [facility]   IR GENERIC HISTORICAL  02/07/2016   IR PATIENT EVAL TECH 0-60 MINS Darrell K Allred, PA-C [facility]   IR GENERIC HISTORICAL  02/08/2016   IR [place] TUBE CHANGE 02/08/2016 Greggory Keen, MD [facility]   IR GENERIC HISTORICAL  01/06/2016   IR [place] TUBE CHANGE 01/06/2016  [facility] OUT REF   IR GENERIC HISTORICAL  05/02/2016   IR CM INJ ANY COLONIC TUBE W/FLUORO 05/02/2016 Arne Cleveland, MD [facility]   IR GENERIC HISTORICAL  05/15/2016   IR [place] TUBE CHANGE 05/15/2016 Sandi Mariscal, MD [facility]   IR GENERIC HISTORICAL  06/28/2016   IR [place] TUBE CHANGE 06/28/2016 [facility]   IR [place] TUBE CHANGE  02/20/2017   IR [place] TUBE CHANGE  05/10/2017   IR [place] TUBE CHANGE  07/06/2017   IR [place] TUBE CHANGE  08/02/2017   IR [place] TUBE CHANGE  10/15/2017   IR [place] TUBE CHANGE  12/26/2017   IR [place] TUBE CHANGE  04/08/2018   IR [place] TUBE CHANGE  06/19/2018   IR [place] TUBE CHANGE  03/03/2019   IR [place] TUBE CHANGE  06/06/2019   IR [place] TUBE CHANGE  09/26/2019   IR [place] TUBE CHANGE  01/09/2020   IR [place] TUBE CHANGE  05/12/2020   IR [place]  TUBE CHANGE  10/05/2020   IR GJ TUBE CHANGE  10/14/2020   IR GJ TUBE CHANGE  10/19/2020   IR GJ TUBE CHANGE  02/25/2021   IR GJ TUBE CHANGE  03/23/2021   IR GJ TUBE CHANGE  08/29/2021   IR GJ TUBE CHANGE  08/30/2021   IR GJ TUBE CHANGE  09/05/2021   IR GJ TUBE CHANGE  01/26/2022   IR PATIENT EVAL TECH 0-60 MINS  10/19/2016   IR PATIENT EVAL TECH 0-60 MINS  12/25/2016   IR PATIENT EVAL TECH 0-60 MINS  01/29/2017   IR PATIENT EVAL TECH 0-60 MINS  04/04/2017   IR PATIENT EVAL TECH 0-60 MINS  04/30/2017   IR PATIENT EVAL TECH 0-60 MINS  07/31/2017   IR REPLC DUODEN/JEJUNO TUBE PERCUT W/FLUORO  11/14/2016   IR REPLC GASTRO/COLONIC TUBE PERCUT W/FLUORO  05/31/2021   IRRIGATION AND DEBRIDEMENT FOOT Bilateral 12/26/2020   Procedure: IRRIGATION AND DEBRIDEMENT FOOT AND BONE BIOPSY;  Surgeon: Trula Slade, DPM;  Location: [place];  Service: Podiatry;  Laterality: Bilateral;   LAPAROTOMY N/A 01/20/2015   Procedure: EXPLORATORY LAPAROTOMY;  Surgeon: Coralie Keens, MD;  Location: [place];  Service: General;  Laterality: N/A;   LOWER EXTREMITY ANGIOGRAPHY Left 09/21/2016   Procedure: Lower Extremity Angiography;  Surgeon: Waynetta Sandy, MD;  Location: [place] [facility];  Service: Cardiovascular;  Laterality: Left;   LOWER EXTREMITY ANGIOGRAPHY Right 06/27/2017   Procedure: Lower Extremity Angiography;  Surgeon: Waynetta Sandy, MD;  Location: [place] [facility];  Service: Cardiovascular;  Laterality: Right;   LOWER EXTREMITY ANGIOGRAPHY Right 12/24/2020   Procedure: LOWER EXTREMITY ANGIOGRAPHY;  Surgeon: Cherre Robins, MD;  Location: [place] [facility];  Service: Cardiovascular;  Laterality: Right;   LYSIS OF ADHESION N/A 01/20/2015   Procedure: LYSIS OF ADHESIONS < 1 HOUR;  Surgeon: Coralie Keens, MD;  Location: [place];  Service: General;  Laterality: N/A;   PERIPHERAL VASCULAR BALLOON ANGIOPLASTY Left 09/21/2016   Procedure: Peripheral Vascular Balloon Angioplasty;  Surgeon: Waynetta Sandy, MD;  Location: [place] [facility];  Service: Cardiovascular;  Laterality: Left;  drug coated balloon   PERIPHERAL VASCULAR BALLOON ANGIOPLASTY Right 06/27/2017   Procedure: PERIPHERAL VASCULAR BALLOON ANGIOPLASTY;  Surgeon: Waynetta Sandy, MD;  Location: [place] [facility];  Service: Cardiovascular;  Laterality: Right;  SFA/TPTRUNK   PERIPHERAL VASCULAR BALLOON ANGIOPLASTY  06/02/2022   Procedure: PERIPHERAL VASCULAR BALLOON ANGIOPLASTY;  Surgeon: Cherre Robins, MD;  Location: [place] [facility];  Service: Cardiovascular;;  Tibial   PERIPHERAL VASCULAR INTERVENTION Left 10/20/2020   Procedure: PERIPHERAL VASCULAR INTERVENTION;  Surgeon: Cherre Robins, MD;  Location: [place] [facility];  Service: Cardiovascular;  Laterality: Left;   PERIPHERAL VASCULAR INTERVENTION  06/02/2022   Procedure: PERIPHERAL VASCULAR INTERVENTION;  Surgeon: Cherre Robins, MD;  Location: [place] [facility];  Service: Cardiovascular;;  SFA   TONSILLECTOMY     TRANSMETATARSAL AMPUTATION Right 11/14/2019   Procedure: TRANSMETATARSAL AMPUTATION RIGHT FOOT;  Surgeon: Edrick Kins, DPM;  Location: [place];  Service: Podiatry;  Laterality: Right;    TUBAL LIGATION     VENTRAL HERNIA REPAIR  2015   incarcerated ventral hernia ([facility] 09/2013)/notes 01/10/2015   WOUND EXPLORATION Right 10/20/2020   Procedure: PRIMARY CLOSURE COMMON FEMORAL ARTERY;  Surgeon: Cherre Robins, MD;  Location: [facility] OR;  Service: Vascular;  Laterality: Right;   HPI:  Patient is a 89 y.o. female who presented with weakness/confusion, fever, LLE swelling/pain.  Dx with sepsis, likely from LLE cellulitis, AKI, acute encephalopathy. PMHx PAD-s/p recent angioplasty/PCI on 3/1, asthma/COPD overlap, DM-2, HTN, chronic HFpEF, CKD stage IIIb, dysphagia-s/p J-tube placement-on nocturnal feedings.  Last bedside swallowing assessment in [facility] system was 05/19/19- documented hx of paraesophageal hernia and GERD; had J-tube for nocturnal TF but had POs during the day (primarily chicken noodle soup and jello).    Assessment / Plan / Recommendation  Clinical Impression  Pt presents with baseline swallow function with no hx nor current pharyngeal dysphagia.  She eats essentially a full liquid diet at home.  She demonstrated no oral nor pharyngeal s/s of dysphagia during today's evaluation. Consumed thin liquids and soft purees per her preference. Recommend resuming a full liquid diet to supplement nocturnal TF. No SLP f/u is needed. SLP Visit Diagnosis: Dysphagia, unspecified (R13.10)    Aspiration Risk  No limitations    Diet Recommendation   Full liquid diet  Medication Administration: Other (Comment) (per pt's preference)    Other  Recommendations Oral Care Recommendations: Oral care BID    Recommendations for follow up therapy are one component of a multi-disciplinary discharge planning process, led by the attending physician.  Recommendations may be updated based on patient status, additional functional criteria and insurance authorization.  Follow up Recommendations No SLP follow up        Swallow Study   General HPI: Patient is a 89 y.o. female who presented with  weakness/confusion, fever, LLE swelling/pain. Dx with sepsis, likely from LLE cellulitis, AKI, acute encephalopathy. PMHx PAD-s/p recent angioplasty/PCI on 3/1, asthma/COPD overlap, DM-2, HTN, chronic HFpEF, CKD stage IIIb, dysphagia-s/p J-tube placement-on nocturnal feedings.  Last bedside swallowing assessment in [facility] system was 05/19/19- documented hx of paraesophageal hernia and GERD; had J-tube for nocturnal TF but had POs during the day (primarily chicken noodle soup and jello). Type of Study: Bedside Swallow Evaluation Previous Swallow Assessment: see HPI Diet Prior to this Study: Thin liquids (Level 0) Temperature Spikes Noted: No Respiratory Status: Room air History of Recent Intubation: No Behavior/Cognition: Alert Oral Cavity Assessment: Within Functional Limits Oral Care Completed by SLP: Recent completion by staff Oral Cavity - Dentition: Edentulous Vision: Functional for self-feeding Self-Feeding Abilities: Able to feed self Patient Positioning: Upright in bed Baseline Vocal Quality: Normal Volitional Cough: Strong Volitional Swallow: Able to elicit    Oral/Motor/Sensory Function Overall Oral Motor/Sensory Function: Within functional limits   Ice Chips Ice chips: Within functional limits   Thin Liquid Thin Liquid: Within functional limits    Nectar Thick Nectar Thick Liquid: Not tested   Honey Thick Honey Thick Liquid: Not tested   Puree Puree: Within functional limits   Solid     Solid: Not tested      Juan Quam Laurice 06/08/2022,2:22 PM  Estill Bamberg L. Tivis Ringer, MA CCC/SLP Clinical Specialist - [place] Office number [phone]

## 2022-06-08 NOTE — Progress Notes (Signed)
VASCULAR LAB    Bilateral lower extremity venous duplexf has been performed.  See CV proc for preliminary results.   Lawonda Pretlow, RVT 06/08/2022, 9:18 AM

## 2022-06-08 NOTE — H&P (Addendum)
History and Physical  Shannon [name] [id number] DOB: [birth year] DOA: 06/14/2022  Referring physician: Deno Etienne PA-EDP  PCP: [name], NP  Outpatient Specialists: Cardiology, vascular surgery, podiatry. Patient coming from: Home.  Chief Complaint: Left leg pain  HPI: Shannon [name] is a 89 y.o. female with medical history significant for peripheral artery disease on dual antiplatelet aspirin and Plavix, status post lower extremity angiogram with second order cannulation by Dr. Stanford Breed on December 24, 2020, status post stent placement left leg on Friday 06/02/2022, chronic wounds to bilateral feet, COPD with asthma, type 2 diabetes, diabetic polyneuropathy, essential hypertension, chronic anxiety/depression, hypothyroidism, chronic diastolic CHF, CKD 3B, dysphagia status post PEG tube placement, anemia of chronic disease, who presented to [facility] ED from home via EMS due to left leg pain.  In the ED, she is alert and oriented x 3.  On exam, her LLE is edematous and significantly tender with minimal palpation, 10 out of 10 pain.  D-dimer ordered and is elevated 2.0, lower extremity Doppler ultrasound is pending to rule out a DVT.    Lab studies also notable for elevated high-sensitivity troponins, which are up-trending, cardiology has been consulted.   TRH, hospitalist service was asked to admit.  Due to elevated D-dimer with concern for possible thromboembolism and uptrending troponins, heparin drip was started until DVT and NSTEMI are ruled out.   ED Course: Tmax 103.6.  BP 115/69, pulse 65, respiratory 17, oxygen saturation 100% on room air.  Lab studies remarkable for WBC 12.5, globin 8.3, MCV 101.  Symptoms 131, potassium 5.2.  Creatinine BUN 75, creatinine 2.54.  Review of Systems: Review of systems as noted in the HPI. All other systems reviewed and are negative.   Past Medical History:  Diagnosis Date   Acute respiratory failure ([place])    Anemia    previous blood  transfusions   Arthritis    "all over"   Asthma    Atrial flutter (HCC)    Bradycardia    requiring previous d/c of BB and reduction of amiodarone   CAD (coronary artery disease)    nonobstructive per notes   Chronic diastolic CHF (congestive heart failure) ([place])    CKD (chronic kidney disease), stage III ([place])    Complication of blood transfusion    "got the wrong blood type at [facility] in ~ 2015; no adverse reaction that we are aware of"/daughter, [name] (01/27/2016)   COPD (chronic obstructive pulmonary disease) ([place])    Depression    "light case"   DVT (deep venous thrombosis) ([place]) 01/2016   a. LLE DVT 01/2016 - switched from Eliquis to Coumadin.   Dyspnea    with some activity   Gastric stenosis    a. s/p stomach tube   GERD (gastroesophageal reflux disease)    GI bleed    Headache    History of 2019 novel coronavirus disease (COVID-19)    History of blood transfusion    "several" (01/27/2016)   History of stomach ulcers    Hyperlipidemia    Hypertension    Hypothyroidism    On home oxygen therapy    "2L; 9PM til 9AM" (06/27/2017)   PAD (peripheral artery disease) (HCC)    a. prior gangrene, toe amputations, intervention.   PAF (paroxysmal atrial fibrillation) ([place])    Paraesophageal hernia    Perforated gastric ulcer ([place])    Pneumonia    "a few times" (06/27/2017)   Seasonal allergies    SIADH (syndrome of inappropriate ADH  production) ([place])    [name] 01/10/2015   Small bowel obstruction ([place])    "I don't know how many" (01/11/2015)   Stroke [facility])    several- no residual   Type II diabetes mellitus ([place])    "related to prednisone use  for > 20 yr; once predinose stopped; no more DM RX" (01/27/2016)   UTI (urinary tract infection) 02/08/2016   Ventral hernia with bowel obstruction    Past Surgical History:  Procedure Laterality Date   ABDOMINAL AORTOGRAM N/A 09/21/2016   Procedure: Abdominal Aortogram;  Surgeon: Waynetta Sandy, MD;  Location: [place] [facility];  Service: Cardiovascular;  Laterality: N/A;   ABDOMINAL AORTOGRAM W/LOWER EXTREMITY N/A 10/20/2020   Procedure: ABDOMINAL AORTOGRAM W/LOWER EXTREMITY;  Surgeon: Cherre Robins, MD;  Location: [place] [facility];  Service: Cardiovascular;  Laterality: N/A;   ABDOMINAL AORTOGRAM W/LOWER EXTREMITY N/A 06/02/2022   Procedure: ABDOMINAL AORTOGRAM W/LOWER EXTREMITY;  Surgeon: Cherre Robins, MD;  Location: [place] [facility];  Service: Cardiovascular;  Laterality: N/A;   AMPUTATION Left 09/25/2016   Procedure: AMPUTATION DIGIT- LEFT 2ND AND 3RD TOES;  Surgeon: Rosetta Posner, MD;  Location: [place];  Service: Vascular;  Laterality: Left;   AMPUTATION Right 05/03/2018   Procedure: AMPUTATION RIGHT GREAT TOE;  Surgeon: Rosetta Posner, MD;  Location: [facility];  Service: Vascular;  Laterality: Right;   CATARACT EXTRACTION W/ INTRAOCULAR LENS  IMPLANT, BILATERAL     CHOLECYSTECTOMY OPEN     COLECTOMY     ESOPHAGOGASTRODUODENOSCOPY N/A 01/19/2014   Procedure: ESOPHAGOGASTRODUODENOSCOPY (EGD);  Surgeon: Irene Shipper, MD;  [facility];  Service: Endoscopy;  Laterality: N/A;   ESOPHAGOGASTRODUODENOSCOPY N/A 01/20/2014   Procedure: ESOPHAGOGASTRODUODENOSCOPY (EGD);  Surgeon: Irene Shipper, MD;  Location: [facility];  Service: Endoscopy;  Laterality: N/A;   ESOPHAGOGASTRODUODENOSCOPY N/A 03/19/2014   Procedure: ESOPHAGOGASTRODUODENOSCOPY (EGD);  Surgeon: Milus Banister, MD;  Location: [facility];  Service: Endoscopy;  Laterality: N/A;   ESOPHAGOGASTRODUODENOSCOPY N/A 07/08/2015   Procedure: ESOPHAGOGASTRODUODENOSCOPY (EGD);  Surgeon: Doran Stabler, MD;  Location: [facility]OSCOPY;  Service: Endoscopy;  Laterality: N/A;   ESOPHAGOGASTRODUODENOSCOPY (EGD) WITH PROPOFOL N/A 09/15/2015   Procedure: ESOPHAGOGASTRODUODENOSCOPY (EGD) WITH PROPOFOL;  Surgeon: Manus Gunning, MD;  [facility]: [facility];  Service: Gastroenterology;  Laterality: N/A;   ESOPHAGOGASTRODUODENOSCOPY (EGD)  WITH PROPOFOL N/A 10/12/2020   Procedure: ESOPHAGOGASTRODUODENOSCOPY (EGD) WITH PROPOFOL;  Surgeon: Gatha Mayer, MD;  Location: [place];  Service: Endoscopy;  Laterality: N/A;   FOOT SURGERY     GASTROJEJUNOSTOMY     hx/notes 01/10/2015   GASTROJEJUNOSTOMY N/A 09/23/2015   Procedure: OPEN GASTROJEJUNOSTOMY TUBE PLACEMENT;  Surgeon: Arta Bruce Kinsinger, MD;  Location: [facility];  Service: General;  Laterality: N/A;   GLAUCOMA SURGERY Bilateral    HEMATOMA EVACUATION Right 10/20/2020   Procedure: EVACUATION HEMATOMA RIGHT GROIN;  Surgeon: Cherre Robins, MD;  Location: [place];  Service: Vascular;  Laterality: Right;   IR CM INJ ANY COLONIC TUBE W/FLUORO  01/05/2017   IR CM INJ ANY COLONIC TUBE W/FLUORO  06/07/2017   IR CM INJ ANY COLONIC TUBE W/FLUORO  11/05/2017   IR CM INJ ANY COLONIC TUBE W/FLUORO  09/18/2018   IR CM INJ ANY COLONIC TUBE W/FLUORO  03/06/2019   IR CM INJ ANY COLONIC TUBE W/FLUORO  03/11/2019   IR GASTR TUBE CONVERT GASTR-JEJ PER W/FL MOD SED  04/30/2020   IR GENERIC HISTORICAL  01/07/2016   IR [employer] TUBE CHANGE 01/07/2016  Jacqulynn Cadet, MD [facility]   IR GENERIC HISTORICAL  01/27/2016   IR MECH REMOV OBSTRUC MAT ANY COLON TUBE W/FLUORO 01/27/2016 Markus Daft, MD [facility]   IR GENERIC HISTORICAL  02/07/2016   IR PATIENT EVAL TECH 0-60 MINS Darrell K Allred, PA-C [facility]   IR GENERIC HISTORICAL  02/08/2016   IR GJ TUBE CHANGE 02/08/2016 Greggory Keen, MD [facility]   IR GENERIC HISTORICAL  01/06/2016   IR GJ TUBE CHANGE 01/06/2016 [facility] OUT REF   IR GENERIC HISTORICAL  05/02/2016   IR CM INJ ANY COLONIC TUBE W/FLUORO 05/02/2016 Arne Cleveland, MD [facility]   IR GENERIC HISTORICAL  05/15/2016   IR GJ TUBE CHANGE 05/15/2016 Sandi Mariscal, MD [facility]   IR GENERIC HISTORICAL  06/28/2016   IR GJ TUBE CHANGE 06/28/2016 [facility]   IR GJ TUBE CHANGE  02/20/2017   IR GJ TUBE CHANGE  05/10/2017   IR GJ TUBE CHANGE  07/06/2017   IR GJ TUBE CHANGE   08/02/2017   IR GJ TUBE CHANGE  10/15/2017   IR GJ TUBE CHANGE  12/26/2017   IR GJ TUBE CHANGE  04/08/2018   IR GJ TUBE CHANGE  06/19/2018   IR GJ TUBE CHANGE  03/03/2019   IR GJ TUBE CHANGE  06/06/2019   IR GJ TUBE CHANGE  09/26/2019   IR GJ TUBE CHANGE  01/09/2020   IR GJ TUBE CHANGE  05/12/2020   IR GJ TUBE CHANGE  10/05/2020   IR GJ TUBE CHANGE  10/14/2020   IR GJ TUBE CHANGE  10/19/2020   IR GJ TUBE CHANGE  02/25/2021   IR GJ TUBE CHANGE  03/23/2021   IR GJ TUBE CHANGE  08/29/2021   IR GJ TUBE CHANGE  08/30/2021   IR GJ TUBE CHANGE  09/05/2021   IR GJ TUBE CHANGE  01/26/2022   IR PATIENT EVAL TECH 0-60 MINS  10/19/2016   IR PATIENT EVAL TECH 0-60 MINS  12/25/2016   IR PATIENT EVAL TECH 0-60 MINS  01/29/2017   IR PATIENT EVAL TECH 0-60 MINS  04/04/2017   IR PATIENT EVAL TECH 0-60 MINS  04/30/2017   IR PATIENT EVAL TECH 0-60 MINS  07/31/2017   IR REPLC DUODEN/JEJUNO TUBE PERCUT W/FLUORO  11/14/2016   IR REPLC GASTRO/COLONIC TUBE PERCUT W/FLUORO  05/31/2021   IRRIGATION AND DEBRIDEMENT FOOT Bilateral 12/26/2020   Procedure: IRRIGATION AND DEBRIDEMENT FOOT AND BONE BIOPSY;  Surgeon: Trula Slade, DPM;  Location: [facility];  Service: Podiatry;  Laterality: Bilateral;   LAPAROTOMY N/A 01/20/2015   Procedure: EXPLORATORY LAPAROTOMY;  Surgeon: Coralie Keens, MD;  Location: [place];  Service: General;  Laterality: N/A;   LOWER EXTREMITY ANGIOGRAPHY Left 09/21/2016   Procedure: Lower Extremity Angiography;  Surgeon: Waynetta Sandy, MD;  Location: [place] [facility];  Service: Cardiovascular;  Laterality: Left;   LOWER EXTREMITY ANGIOGRAPHY Right 06/27/2017   Procedure: Lower Extremity Angiography;  Surgeon: Waynetta Sandy, MD;  Location: [place] [facility];  Service: Cardiovascular;  Laterality: Right;   LOWER EXTREMITY ANGIOGRAPHY Right 12/24/2020   Procedure: LOWER EXTREMITY ANGIOGRAPHY;  Surgeon: Cherre Robins, MD;  Location: [place] [facility];  Service:  Cardiovascular;  Laterality: Right;   LYSIS OF ADHESION N/A 01/20/2015   Procedure: LYSIS OF ADHESIONS < 1 HOUR;  Surgeon: Coralie Keens, MD;  Location: [place];  Service: General;  Laterality: N/A;   PERIPHERAL VASCULAR BALLOON ANGIOPLASTY Left 09/21/2016   Procedure: Peripheral Vascular Balloon Angioplasty;  Surgeon: Waynetta Sandy, MD;  Location: [place] [facility];  Service: Cardiovascular;  Laterality: Left;  drug coated balloon   PERIPHERAL VASCULAR BALLOON ANGIOPLASTY Right 06/27/2017   Procedure: PERIPHERAL VASCULAR BALLOON ANGIOPLASTY;  Surgeon: Waynetta Sandy, MD;  Location: [place] [facility];  Service: Cardiovascular;  Laterality: Right;  SFA/TPTRUNK   PERIPHERAL VASCULAR BALLOON ANGIOPLASTY  06/02/2022   Procedure: PERIPHERAL VASCULAR BALLOON ANGIOPLASTY;  Surgeon: Cherre Robins, MD;  Location: [place] [facility];  Service: Cardiovascular;;  Tibial   PERIPHERAL VASCULAR INTERVENTION Left 10/20/2020   Procedure: PERIPHERAL VASCULAR INTERVENTION;  Surgeon: Cherre Robins, MD;  Location: [place] [facility];  Service: Cardiovascular;  Laterality: Left;   PERIPHERAL VASCULAR INTERVENTION  06/02/2022   Procedure: PERIPHERAL VASCULAR INTERVENTION;  Surgeon: Cherre Robins, MD;  Location: [place] [facility];  Service: Cardiovascular;;  SFA   TONSILLECTOMY     TRANSMETATARSAL AMPUTATION Right 11/14/2019   Procedure: TRANSMETATARSAL AMPUTATION RIGHT FOOT;  Surgeon: Edrick Kins, DPM;  Location: [place];  Service: Podiatry;  Laterality: Right;   TUBAL LIGATION     VENTRAL HERNIA REPAIR  2015   incarcerated ventral hernia ([facility] 09/2013)/notes 01/10/2015   WOUND EXPLORATION Right 10/20/2020   Procedure: PRIMARY CLOSURE COMMON FEMORAL ARTERY;  Surgeon: Cherre Robins, MD;  Location: [place];  Service: Vascular;  Laterality: Right;    Social History:  reports that she has never smoked. She has never been exposed to tobacco smoke. She quit smokeless tobacco use about 42  years ago.  Her smokeless tobacco use included snuff. She reports that she does not drink alcohol and does not use drugs.   Allergies  Allergen Reactions   Penicillins Itching, Rash and Other (See Comments)    Tolerated amoxicillin, unasyn, zosyn & cephalosporins in the past. Did it involve swelling of the face/tongue/throat, SOB, or low BP? No Did it involve sudden or severe rash/hives, skin peeling, or any reaction on the inside of your mouth or nose? No Did you need to seek medical attention at a hospital or doctor's office? Unknown When did it last happen?      5+ years If all above answers are "NO", may proceed with cephalosporin use.     Family History  Problem Relation Age of Onset   Stroke Mother    Hypertension Mother    Diabetes Brother    Glaucoma Son    Glaucoma Son    Heart attack Neg Hx    Colon cancer Neg Hx    Stomach cancer Neg Hx       Prior to Admission medications   Medication Sig Start Date End Date Taking? Authorizing Provider  acetaminophen (TYLENOL) 500 MG tablet Take 500-1,000 mg by mouth every 6 (six) hours as needed for moderate pain.    [provider]  albuterol (VENTOLIN HFA) 108 (90 Base) MCG/ACT inhaler Inhale 2 puffs into the lungs every 6 (six) hours as needed for wheezing or shortness of breath. 05/15/22   [name], NP  amLODipine (NORVASC) 5 MG tablet TAKE 1 TABLET BY MOUTH EVERY DAY 04/11/22   [name], NP  ASPIRIN LOW DOSE 81 MG tablet TAKE 1 TABLET (81 MG) BY MOUTH DAILY 10/10/21   Waynetta Sandy, MD  atorvastatin (LIPITOR) 10 MG tablet TAKE 1 TABLET BY MOUTH EVERY DAY 12/30/21   [name], NP  b complex vitamins tablet Take 1 tablet by mouth daily.     [provider]  cholecalciferol (VITAMIN D3) 25 MCG (1000 UNIT) tablet Take 1,000 Units by mouth daily.    [provider]  clopidogrel (PLAVIX) 75 MG tablet Take 1 tablet (75 mg total) by mouth daily. 06/02/22 06/02/23  Cherre Robins, MD  cycloSPORINE (RESTASIS) 0.05 % ophthalmic emulsion Place 1 drop into both eyes 2 (two) times daily.    [provider]  doxycycline (VIBRA-TABS) 100 MG tablet TAKE 1 TABLET BY MOUTH EVERY DAY 06/05/22   Edrick Kins, DPM  DULoxetine (CYMBALTA) 60 MG capsule Take 1 capsule (60 mg total) by mouth daily. 03/30/22   [name], NP  ferrous sulfate 220 (44 Fe) MG/5ML solution TAKE 7.4 MLS (325 MG TOTAL) BY MOUTH DAILY. 09/08/21   [name], NP  fluticasone (FLONASE) 50 MCG/ACT nasal spray SPRAY 2 SPRAYS INTO EACH NOSTRIL EVERY DAY 09/01/21   [name], NP  furosemide (LASIX) 40 MG tablet TAKE 0.5-1 TABLETS (20-40 MG TOTAL) BY MOUTH SEE ADMIN INSTRUCTIONS. TAKE 20 MG DAILY, MAY INSTEAD TAKE 40 MG DAILY AS NEEDED FOR EDEMA 08/04/20   Freada Bergeron, MD  gabapentin (NEURONTIN) 300 MG capsule TAKE 1 CAPSULE BY MOUTH TWICE A DAY 10/03/21   [name], NP  gentamicin cream (GARAMYCIN) 0.1 % Apply 1 application topically daily as needed (dry skin). 10/01/19   Edrick Kins, DPM  hydrALAZINE (APRESOLINE) 50 MG tablet Take 1 tablet (50 mg total) by mouth 3 (three) times daily. 07/18/21   Freada Bergeron, MD  latanoprost (XALATAN) 0.005 % ophthalmic solution Place 1 drop into both eyes at bedtime.    [provider]  levocetirizine (XYZAL) 5 MG tablet Take 5 mg by mouth every evening.    [provider]  levothyroxine (SYNTHROID) 100 MCG tablet TAKE 1 TABLET BY MOUTH EVERY DAY 06/05/22   [name], NP  loratadine (CLARITIN) 10 MG tablet Take 10 mg by mouth daily.    [provider]  mometasone-formoterol (DULERA) 200-5 MCG/ACT AERO Inhale 2 puffs into the lungs 2 (two) times daily. 02/13/22   [name], NP  Multiple Vitamin (MULTIVITAMIN WITH MINERALS) TABS tablet Take 1 tablet by mouth daily.    [provider]  Nutritional Supplements (FEEDING SUPPLEMENT, OSMOLITE 1.5 CAL,) LIQD Place 780 mLs into  feeding tube See admin instructions. Take 780 ml over a 12 hour period per day 9pm-9am at a rate of 55    [provider]  nystatin (MYCOSTATIN) 100000 UNIT/ML suspension Take 5 mLs (500,000 Units total) by mouth 4 (four) times daily. Patient not taking: Reported on 06/01/2022 03/03/22   [name], NP  nystatin cream (MYCOSTATIN) Apply 1 application  topically 2 (two) times daily as needed for dry skin.    [provider]  oxyCODONE (OXY IR/ROXICODONE) 5 MG immediate release tablet Take 2.5-5 mg by mouth every 4 (four) hours as needed for severe pain.    [provider]  pantoprazole (PROTONIX) 40 MG tablet TAKE 1 TABLET BY MOUTH TWICE A DAY 05/02/22   [name], NP  Polyethyl Glycol-Propyl Glycol (SYSTANE) 0.4-0.3 % GEL ophthalmic gel Place 1 application  into both eyes 4 (four) times daily.    [provider]  polyethylene glycol powder (GLYCOLAX/MIRALAX) 17 GM/SCOOP powder Take 17 g by mouth at bedtime as needed for moderate constipation.    [provider]  Probiotic Product (EQL PROBIOTIC COLON SUPPORT PO) Take 1 capsule by mouth daily.    [provider]  senna-docusate (SENOKOT-S) 8.6-50 MG tablet Take 1 tablet by mouth at bedtime as needed for mild constipation.    [provider]  Sodium Chloride-Sodium Bicarb (NETI POT SINUS [place] NA) Place 1 application  into the nose 2 (two) times daily.    [provider]  Water For Irrigation, Sterile (FREE WATER) SOLN Place 180 mLs into feeding tube 3 (three) times daily.    [provider]  White Petrolatum-Mineral Oil (SYSTANE NIGHTTIME) OINT Place 1 application into both eyes at bedtime.     [provider]    Physical Exam: BP (!) 126/49   Pulse 68   Temp 99.4 F (37.4 C) (Rectal)   Resp 16   Ht '5\' 6"'$  (1.676 m)   Wt 59 kg   SpO2 100%   BMI 20.98 kg/m   General: 89 y.o. year-old female well developed well nourished in no acute distress.   Alert and oriented x3. Cardiovascular: Regular rate and rhythm with no rubs or gallops.  No thyromegaly or JVD noted.  left lower extremity edema. Respiratory: Clear to auscultation with no wheezes or rales. Good inspiratory effort. Abdomen: Soft nontender nondistended with normal bowel sounds x4 quadrants. Muskuloskeletal: Left lower extremity edematous and tender. Neuro: CN II-XII intact, strength, sensation, reflexes Skin: Mild erythema left lower extremity. Psychiatry: Judgement and insight appear normal. Mood is appropriate for condition and setting          Labs on Admission:  Basic Metabolic Panel: Recent Labs  Lab 06/02/22 0822 06/16/2022 2245  NA 137 131*  K 4.5 5.2*  CL 101 100  CO2  --  22  GLUCOSE 104* 131*  BUN 56* 75*  CREATININE 2.00* 2.54*  CALCIUM  --  8.7*   Liver Function Tests: Recent Labs  Lab 06/21/2022 2245  AST 28  ALT 18  ALKPHOS 75  BILITOT 0.7  PROT 7.3  ALBUMIN 2.8*   No results for input(s): "LIPASE", "AMYLASE" in the last 168 hours. No results for input(s): "AMMONIA" in the last 168 hours. CBC: Recent Labs  Lab 06/02/22 0822 06/09/2022 2245  WBC  --  12.5*  NEUTROABS  --  10.5*  HGB 10.2* 8.3*  HCT 30.0* 25.3*  MCV  --  101.6*  PLT  --  213   Cardiac Enzymes: No results for input(s): "CKTOTAL", "CKMB", "CKMBINDEX", "TROPONINI" in the last 168 hours.  BNP (last 3 results) Recent Labs    08/29/21 0717  BNP 195.8*    ProBNP (last 3 results) No results for input(s): "PROBNP" in the last 8760 hours.  CBG: No results for input(s): "GLUCAP" in the last 168 hours.  Radiological Exams on Admission: CT ABDOMEN PELVIS WO CONTRAST  Result Date: 06/08/2022 CLINICAL DATA:  Altered mental status, recent left leg stent, sacral wound EXAM: CT ABDOMEN AND PELVIS WITHOUT CONTRAST TECHNIQUE: Multidetector CT imaging of the abdomen and pelvis was performed following the standard protocol without IV contrast. RADIATION DOSE REDUCTION: This exam  was performed according to the departmental dose-optimization program which includes automated exposure control, adjustment of the mA and/or kV according to patient size and/or use of iterative reconstruction technique. COMPARISON:  08/28/2021 FINDINGS: Lower chest: Trace bilateral pleural effusions. Hepatobiliary: 10 mm cyst in the central right hepatic lobe (series 3/image 26), benign. Status post cholecystectomy. No intrahepatic or extrahepatic ductal dilatation. Pancreas: Within normal limits. Spleen: Within normal limits. Adrenals/Urinary Tract: Adrenal glands are within normal limits. Kidneys are within normal limits. No renal calculi or hydronephrosis. Bladder is within normal  limits. 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 No evidence of bowel obstruction. Appendix is not discretely visualized. Prior left hemicolectomy with suture line in the lower pelvis (series 3/image 63). No colonic wall thickening or inflammatory changes. Vascular/Lymphatic: No evidence of abdominal aortic aneurysm. Atherosclerotic calcifications of the abdominal aorta and branch vessels. No suspicious abdominopelvic lymphadenopathy. Reproductive: The uterus is within normal limits. No adnexal masses. Other: Moderate right paramidline periumbilical hernia containing multiple nondilated loops of small bowel (series 3/image 44), without associated fluid or inflammatory changes. Small left paramidline periumbilical hernia containing fat (series 3/image 48), without fluid or associated inflammatory changes. Tiny fat containing right inguinal hernia (series 3/image 35). No abdominopelvic ascites. Musculoskeletal: Mild anterior wedging of the T11 vertebral body (sagittal image 93), chronic. Mild degenerative changes of the visualized thoracolumbar spine. IMPRESSION: No acute findings in the abdomen/pelvis. Trace bilateral pleural effusions. Additional postsurgical and  ancillary findings as above. Electronically Signed   By: Julian Hy M.D.   On: 06/08/2022 01:50   DG Abdomen 1 View  Result Date: 06/08/2022 CLINICAL DATA:  Abdominal distension EXAM: ABDOMEN - 1 VIEW COMPARISON:  07/06/2020 FINDINGS: Gastrojejunal catheter is noted in satisfactory position. Scattered large and small bowel gas is noted. No obstructive changes are seen. No free air is noted. Bony structures are within normal limits. IMPRESSION: No acute abnormality noted. Electronically Signed   By: Inez Catalina M.D.   On: 06/08/2022 00:10   CT Head Wo Contrast  Result Date: 06/08/2022 CLINICAL DATA:  Altered mental status EXAM: CT HEAD WITHOUT CONTRAST TECHNIQUE: Contiguous axial images were obtained from the base of the skull through the vertex without intravenous contrast. RADIATION DOSE REDUCTION: This exam was performed according to the departmental dose-optimization program which includes automated exposure control, adjustment of the mA and/or kV according to patient size and/or use of iterative reconstruction technique. COMPARISON:  02/14/2021 FINDINGS: Brain: No evidence of acute infarction, hemorrhage, hydrocephalus, extra-axial collection or mass lesion/mass effect. Chronic atrophic and ischemic changes are noted. Vascular: No hyperdense vessel or unexpected calcification. Skull: Normal. Negative for fracture or focal lesion. Sinuses/Orbits: No acute finding. Other: None. IMPRESSION: Chronic atrophic and ischemic changes without acute abnormality. Electronically Signed   By: Inez Catalina M.D.   On: 06/08/2022 00:06   DG Chest Port 1 View  Result Date: 06/13/2022 CLINICAL DATA:  Possible sepsis EXAM: PORTABLE CHEST 1 VIEW COMPARISON:  08/29/2021 FINDINGS: Cardiac shadow is enlarged but stable. Aortic calcifications are noted. Lungs are clear bilaterally. No bony abnormality is noted. IMPRESSION: No active disease. Electronically Signed   By: Inez Catalina M.D.   On: 06/06/2022 23:16    EKG:  I independently viewed the EKG done and my findings are as followed: Sinus rhythm rate of 64.  Nonspecific ST-T changes.  QTc 443.  Assessment/Plan Present on Admission:  Sepsis ([place])  Active Problems:   Sepsis ([place])  Sepsis with concern for possible left lower extremity cellulitis Left lower extremity tender with palpation, edematous, mildly erythematous. Tmax 103.6, WBC 12.5 Follow-up peripheral blood cultures x 2 Continue empiric IV antibiotics started in the ED, cefepime, IV Flagyl, IV vancomycin Gentle IV fluid hydration while on IV vancomycin. Maintain MAP greater than 65 Monitor fever curve and WBC.  Left lower extremity edema Peripheral artery disease on DAPT status post stent placement on 06/02/2022 DAPT held while on heparin drip due to risk of bleeding Lower extremity ultrasound to rule out DVT Pain control and bowel regimen  in place.  Elevated troponin Elevated D-dimer Started on heparin drip until DVT and NSTEMI can be ruled out. Cardiology consulted Follow 2D echo  Anemia of chronic disease Acute blood loss anemia Presented with hemoglobin of 8.3 Repeat hemoglobin 7.3 Obtain FOBT to rule out occult bleeding Hold off DAPT, add GI prophylaxis IV Protonix 40 mg daily. Serial H&H while on heparin drip every 2 hours x 3  Hyperkalemia Serum potassium 5.2 1 dose of Lokelma 10 g x 1  AKI on CKD 3 Baseline creatinine appears to be 1.4 with GFR 33 Presented with creatinine of 2.54 Avoid nephrotoxic agents, dehydration, and hypotension.  Goals of care The patient is DNR, stated by herself.  She is alert only x 3. Palliative care consulted to assist with establishing goals of care.  Critical care time: 65 minutes.   DVT prophylaxis: Heparin drip  Code Status: DNR  Family Communication: None at bedside  Disposition Plan: Admitted to progressive care unit  Consults called: Cardiology  Admission status: Inpatient status.   Status is: Inpatient The  patient requires at least 2 midnights for further evaluation and treatment of present condition.   [name] MD [facility] Pager [phone]  If 7PM-7AM, please contact night-coverage [email or web address] Password [email or web address]  06/08/2022, 6:10 AM

## 2022-06-08 NOTE — Progress Notes (Signed)
Informed by RN-patient developed SOB-likely aspiration event with meds.  Needed NTS suctioning by RT, following which O2 sats improved.  Suctioned mucus and some blue appearing suction Required 100 NRB-but gradually improved-and was switched to HFNC 15L which was gradually lowered to around 5L CXR doesn't show any obvious inflitrate-but some suggestion of RUL opacity on my personal review  Impression Probable acute hypoxic resp failure 2/2 to a aspiration event-now improving post pulmonary toileting  Plan Keep NPO (was on liquids) Strict Aspiration precautions Start Unasyn Repeat swallow eval tomorrow Daughter-[name] updated over the phone DNR reconfirmed

## 2022-06-08 NOTE — ED Notes (Signed)
Dr Nevada Crane contacted to advise of pt's HR dropping intermittently. Repeat EKG is ordered at this time.

## 2022-06-08 NOTE — Progress Notes (Addendum)
PROGRESS NOTE        PATIENT DETAILS Name: Shannon [name] Age: 89 y.o. Sex: female Date of Birth: [birth year] Admit Date: 06/06/2022 Admitting Physician [name], DO WZ:[id number], [name], NP  Brief Summary: Patient is a 89 y.o.  female PAD-s/p recent angioplasty/PCI on 3/1, asthma/COPD overlap, DM-2, HTN, chronic HFpEF, CKD stage IIIb, dysphagia-s/p PEG tube placement-on nocturnal feedings-who presented with weakness/confusion, fever, LLE swelling/pain-she was found to have probable LLE cellulitis with AKI-and subsequently admitted to the hospitalist service.  Significant events: 3/6>> admit to [facility]  Significant studies: 3/6>> CXR: No PNA 3/7>> CT abdomen/pelvis: No acute findings.  Significant microbiology data: 3/6>> COVID/influenza/RSV PCR: Negative 3/6>> blood culture: No growth  Procedures: None  Consults: [facility] Cardiology Palliative care  Subjective: Awake/alert.  Complains of pain in her left lower extremity.  No family at bedside.  Objective: Vitals: Blood pressure (!) 123/57, pulse 64, temperature 98.5 F (36.9 C), resp. rate (!) 23, height '5\' 6"'$  (1.676 m), weight 59 kg, SpO2 95 %.   Exam: Gen Exam:Alert awake-not in any distress HEENT:atraumatic, normocephalic Chest: B/L clear to auscultation anteriorly CVS:S1S2 regular Abdomen:soft non tender, non distended Extremities: Some mild to moderate swelling in the left lower extremity-some pain with deep palpation.  Mild erythema.  Numerous toes bilaterally have been amputated.  Some dry scabs at the amputation sites but no active infection. Neurology: Non focal Skin: no rash  Pertinent Labs/Radiology:    Latest Ref Rng & Units 06/08/2022    6:30 AM 06/12/2022   10:45 PM 06/02/2022    8:22 AM  CBC  WBC 4.0 - 10.5 K/uL 9.0  12.5    Hemoglobin 12.0 - 15.0 g/dL 7.3  8.3  10.2   Hematocrit 36.0 - 46.0 % 22.4  25.3  30.0   Platelets 150 - 400 K/uL 174  213      Lab Results  Component  Value Date   NA 132 (L) 06/08/2022   K 4.7 06/08/2022   CL 100 06/08/2022   CO2 24 06/08/2022      Assessment/Plan: Sepsis likely from LLE cellulitis Febrile/leukocytosis-weak-somewhat confused on initial presentation-but seems to be significantly better this morning Continue IV vancomycin Await cultures Will need to rule out DVT-Dopplers currently pending Will discuss with vascular surgery-same extremity that underwent angioplasties/PCI on 3/01  Addendum Reviewed Doppler results-no obvious DVT seen-will stop IV heparin.  AKI on CKD stage IIIb Suspect hemodynamically mediated in the setting of sepsis but did have recent angiography done so could be some element of contrast-induced nephropathy No hydronephrosis on CT abdomen Check UA Gently hydrate Avoid nephrotoxic agents Follow electrolytes  Acute encephalopathy Apparently was weak/confused when she first presented-seems to have improved rapidly Suspect this could have been from AKI Supportive care  Normocytic anemia No overt evidence of blood loss No history of hematochezia-some intermittent dark stools but patient attributes this to iron therapy Hemoglobin could have dropped from IV fluid hydration-worsening AKI Check FOBT Change PPI to twice daily dosing Recheck a CBC later this evening Will transfuse if significant drop in hemoglobin  Elevated troponins Demand ischemia She has no chest pain Await echo Given AKI/advanced age-do not think she is a good candidate for any further intervention appropriate to manage this medically.  HTN Resume amlodipine-follow BP trend  History of PAD-s/p numerous toe amputations-s/p most recent PCI on 3/1 with angioplasty  plus stenting Will need to discuss with vascular surgery regarding dual antiplatelet agents in a setting of worsening anemia.  Currently on IV heparin empirically until DVT is ruled out. Resume statin but will increase dosage to high intensity  dose.  Dysphagia Liquid diet at home On nocturnal feedings SLP/nutrition eval  Hypothyroidism Continue Synthroid  Peripheral neuropathy Cymbalta/Neurontin  Asthma/COPD overlap Not in exacerbation Continue bronchodilators  Debility/deconditioning PT/OT  Palliative care Confirms DNR Suspect benefits from gentle medical treatment-clearly not a candidate for aggressive care at this point Palliative care consult  has been placed by admitting MD-will await further recommendations.  BMI: Estimated body mass index is 20.98 kg/m as calculated from the following:   Height as of this encounter: '5\' 6"'$  (1.676 m).   Weight as of this encounter: 59 kg.   Code status:   Code Status: DNR   DVT Prophylaxis:IV heparin   Family Communication: Daughter-[name]-[phone]-updated over the phone on 3/7   Disposition Plan: Status is: Inpatient Remains inpatient appropriate because: Severity of illness   Planned Discharge Destination:Home   Diet: Diet Order             Diet NPO time specified  Diet effective now                     Antimicrobial agents: Anti-infectives (From admission, onward)    Start     Dose/Rate Route Frequency Ordered Stop   06/10/22 0600  vancomycin (VANCOREADY) IVPB 750 mg/150 mL        750 mg 150 mL/hr over 60 Minutes Intravenous Every 48 hours 06/08/22 0642     06/08/22 2200  ceFEPIme (MAXIPIME) 2 g in sodium chloride 0.9 % 100 mL IVPB        2 g 200 mL/hr over 30 Minutes Intravenous Every 24 hours 06/08/22 0642     06/08/22 1000  metroNIDAZOLE (FLAGYL) IVPB 500 mg        500 mg 100 mL/hr over 60 Minutes Intravenous Every 12 hours 06/08/22 0642     06/08/22 0030  metroNIDAZOLE (FLAGYL) IVPB 500 mg        500 mg 100 mL/hr over 60 Minutes Intravenous  Once 06/08/22 0020 06/08/22 0217   06/08/22 0030  vancomycin (VANCOCIN) IVPB 1000 mg/200 mL premix        1,000 mg 200 mL/hr over 60 Minutes Intravenous  Once 06/08/22 0020 06/08/22 0317    06/08/22 0030  ceFEPIme (MAXIPIME) 2 g in sodium chloride 0.9 % 100 mL IVPB        2 g 200 mL/hr over 30 Minutes Intravenous  Once 06/08/22 0022 06/08/22 0140        MEDICATIONS: Scheduled Meds:  pantoprazole (PROTONIX) IV  40 mg Intravenous Q12H   sodium zirconium cyclosilicate  10 g Oral Once   Continuous Infusions:  ceFEPime (MAXIPIME) IV     heparin 700 Units/hr (06/08/22 0700)   metronidazole     [START ON 06/10/2022] vancomycin     PRN Meds:.acetaminophen, HYDROmorphone (DILAUDID) injection, oxyCODONE, polyethylene glycol   I have personally reviewed following labs and imaging studies  LABORATORY DATA: CBC: Recent Labs  Lab 06/02/22 0822 06/23/2022 2245 06/08/22 0630  WBC  --  12.5* 9.0  NEUTROABS  --  10.5*  --   HGB 10.2* 8.3* 7.3*  HCT 30.0* 25.3* 22.4*  MCV  --  101.6* 100.0  PLT  --  213 [id number]    Basic Metabolic Panel: Recent Labs  Lab 06/02/22 0822 06/28/2022 2245  06/08/22 0630  NA 137 131* 132*  K 4.5 5.2* 4.7  CL 101 100 100  CO2  --  22 24  GLUCOSE 104* 131* 120*  BUN 56* 75* 76*  CREATININE 2.00* 2.54* 2.58*  CALCIUM  --  8.7* 8.6*  MG  --   --  2.1  PHOS  --   --  4.0    GFR: Estimated Creatinine Clearance: 11.9 mL/min (A) (by C-G formula based on SCr of 2.58 mg/dL (H)).  Liver Function Tests: Recent Labs  Lab 06/09/2022 2245 06/08/22 0630  AST 28 27  ALT 18 22  ALKPHOS 75 72  BILITOT 0.7 0.7  PROT 7.3 7.1  ALBUMIN 2.8* 2.6*   No results for input(s): "LIPASE", "AMYLASE" in the last 168 hours. No results for input(s): "AMMONIA" in the last 168 hours.  Coagulation Profile: Recent Labs  Lab 06/19/2022 2245  INR 1.2    Cardiac Enzymes: No results for input(s): "CKTOTAL", "CKMB", "CKMBINDEX", "TROPONINI" in the last 168 hours.  BNP (last 3 results) No results for input(s): "PROBNP" in the last 8760 hours.  Lipid Profile: No results for input(s): "CHOL", "HDL", "LDLCALC", "TRIG", "CHOLHDL", "LDLDIRECT" in the last 72  hours.  Thyroid Function Tests: No results for input(s): "TSH", "T4TOTAL", "FREET4", "T3FREE", "THYROIDAB" in the last 72 hours.  Anemia Panel: No results for input(s): "VITAMINB12", "FOLATE", "FERRITIN", "TIBC", "IRON", "RETICCTPCT" in the last 72 hours.  Urine analysis:    Component Value Date/Time   COLORURINE YELLOW 08/29/2021 0239   APPEARANCEUR HAZY (A) 08/29/2021 0239   LABSPEC 1.010 08/29/2021 0239   PHURINE 7.0 08/29/2021 0239   GLUCOSEU NEGATIVE 08/29/2021 0239   HGBUR NEGATIVE 08/29/2021 0239   BILIRUBINUR NEGATIVE 08/29/2021 0239   BILIRUBINUR Neg 11/01/2017 1228   KETONESUR NEGATIVE 08/29/2021 0239   PROTEINUR NEGATIVE 08/29/2021 0239   UROBILINOGEN 0.2 11/01/2017 1228   UROBILINOGEN 0.2 01/27/2015 2308   NITRITE NEGATIVE 08/29/2021 0239   LEUKOCYTESUR NEGATIVE 08/29/2021 0239    Sepsis Labs: Lactic Acid, Venous    Component Value Date/Time   LATICACIDVEN 1.8 06/08/2022 0103    MICROBIOLOGY: Recent Results (from the past 240 hour(s))  Blood Culture (routine x 2)     Status: None (Preliminary result)   Collection Time: 06/23/2022 10:30 PM   Specimen: BLOOD  Result Value Ref Range Status   Specimen Description BLOOD RIGHT ANTECUBITAL  Final   Special Requests   Final    BOTTLES DRAWN AEROBIC ONLY Blood Culture results may not be optimal due to an inadequate volume of blood received in culture bottles   Culture   Final    NO GROWTH < 12 HOURS Performed at [facility] [address], [place], [place] [address]    Report Status PENDING  Incomplete  Resp panel by RT-PCR (RSV, Flu A&B, Covid) Anterior Nasal Swab     Status: None   Collection Time: 06/18/2022 10:45 PM   Specimen: Anterior Nasal Swab  Result Value Ref Range Status   SARS Coronavirus 2 by RT PCR NEGATIVE NEGATIVE Final   Influenza A by PCR NEGATIVE NEGATIVE Final   Influenza B by PCR NEGATIVE NEGATIVE Final    Comment: (NOTE) The Xpert Xpress SARS-CoV-2/FLU/RSV plus assay is intended as  an aid in the diagnosis of influenza from Nasopharyngeal swab specimens and should not be used as a sole basis for treatment. Nasal washings and aspirates are unacceptable for Xpert Xpress SARS-CoV-2/FLU/RSV testing.  Fact Sheet for Patients: EntrepreneurPulse.com.au  Fact Sheet for Healthcare  Providers: IncredibleEmployment.be  This test is not yet approved or cleared by the [place] and has been authorized for detection and/or diagnosis of SARS-CoV-2 by FDA under an Emergency Use Authorization (EUA). This EUA will remain in effect (meaning this test can be used) for the duration of the COVID-19 declaration under Section 564(b)(1) of the Act, 21 U.S.C. section 360bbb-3(b)(1), unless the authorization is terminated or revoked.     Resp Syncytial Virus by PCR NEGATIVE NEGATIVE Final    Comment: (NOTE) Fact Sheet for Patients: EntrepreneurPulse.com.au  Fact Sheet for Healthcare Providers: IncredibleEmployment.be  This test is not yet approved or cleared by the [place] FDA and has been authorized for detection and/or diagnosis of SARS-CoV-2 by FDA under an Emergency Use Authorization (EUA). This EUA will remain in effect (meaning this test can be used) for the duration of the COVID-19 declaration under Section 564(b)(1) of the Act, 21 U.S.C. section 360bbb-3(b)(1), unless the authorization is terminated or revoked.  Performed at [facility], [place] [address], [place], [place] [address]   Blood Culture (routine x 2)     Status: None (Preliminary result)   Collection Time: 06/28/2022 10:45 PM   Specimen: BLOOD LEFT FOREARM  Result Value Ref Range Status   Specimen Description BLOOD LEFT FOREARM  Final   Special Requests   Final    BOTTLES DRAWN AEROBIC AND ANAEROBIC Blood Culture results may not be optimal due to an inadequate volume of blood received in culture bottles   Culture   Final     NO GROWTH < 12 HOURS Performed at [facility], [place] [address] [place], [place] [address]    Report Status PENDING  Incomplete    RADIOLOGY STUDIES/RESULTS: CT ABDOMEN PELVIS WO CONTRAST  Result Date: 06/08/2022 CLINICAL DATA:  Altered mental status, recent left leg stent, sacral wound EXAM: CT ABDOMEN AND PELVIS WITHOUT CONTRAST TECHNIQUE: Multidetector CT imaging of the abdomen and pelvis was performed following the standard protocol without IV contrast. RADIATION DOSE REDUCTION: This exam was performed according to the departmental dose-optimization program which includes automated exposure control, adjustment of the mA and/or kV according to patient size and/or use of iterative reconstruction technique. COMPARISON:  08/28/2021 FINDINGS: Lower chest: Trace bilateral pleural effusions. Hepatobiliary: 10 mm cyst in the central right hepatic lobe (series 3/image 26), benign. Status post cholecystectomy. No intrahepatic or extrahepatic ductal dilatation. Pancreas: Within normal limits. Spleen: Within normal limits. Adrenals/Urinary Tract: Adrenal glands are within normal limits. Kidneys are within normal limits. No renal calculi or hydronephrosis. Bladder is within normal limits. 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 No evidence of bowel obstruction. Appendix is not discretely visualized. Prior left hemicolectomy with suture line in the lower pelvis (series 3/image 63). No colonic wall thickening or inflammatory changes. Vascular/Lymphatic: No evidence of abdominal aortic aneurysm. Atherosclerotic calcifications of the abdominal aorta and branch vessels. No suspicious abdominopelvic lymphadenopathy. Reproductive: The uterus is within normal limits. No adnexal masses. Other: Moderate right paramidline periumbilical hernia containing multiple nondilated loops of small bowel (series 3/image 44), without associated fluid or  inflammatory changes. Small left paramidline periumbilical hernia containing fat (series 3/image 48), without fluid or associated inflammatory changes. Tiny fat containing right inguinal hernia (series 3/image 35). No abdominopelvic ascites. Musculoskeletal: Mild anterior wedging of the T11 vertebral body (sagittal image 93), chronic. Mild degenerative changes of the visualized thoracolumbar spine. IMPRESSION: No acute findings in the abdomen/pelvis. Trace bilateral pleural effusions. Additional  postsurgical and ancillary findings as above. Electronically Signed   By: Julian Hy M.D.   On: 06/08/2022 01:50   DG Abdomen 1 View  Result Date: 06/08/2022 CLINICAL DATA:  Abdominal distension EXAM: ABDOMEN - 1 VIEW COMPARISON:  07/06/2020 FINDINGS: Gastrojejunal catheter is noted in satisfactory position. Scattered large and small bowel gas is noted. No obstructive changes are seen. No free air is noted. Bony structures are within normal limits. IMPRESSION: No acute abnormality noted. Electronically Signed   By: Inez Catalina M.D.   On: 06/08/2022 00:10   CT Head Wo Contrast  Result Date: 06/08/2022 CLINICAL DATA:  Altered mental status EXAM: CT HEAD WITHOUT CONTRAST TECHNIQUE: Contiguous axial images were obtained from the base of the skull through the vertex without intravenous contrast. RADIATION DOSE REDUCTION: This exam was performed according to the departmental dose-optimization program which includes automated exposure control, adjustment of the mA and/or kV according to patient size and/or use of iterative reconstruction technique. COMPARISON:  02/14/2021 FINDINGS: Brain: No evidence of acute infarction, hemorrhage, hydrocephalus, extra-axial collection or mass lesion/mass effect. Chronic atrophic and ischemic changes are noted. Vascular: No hyperdense vessel or unexpected calcification. Skull: Normal. Negative for fracture or focal lesion. Sinuses/Orbits: No acute finding. Other: None. IMPRESSION:  Chronic atrophic and ischemic changes without acute abnormality. Electronically Signed   By: Inez Catalina M.D.   On: 06/08/2022 00:06   DG Chest Port 1 View  Result Date: 06/27/2022 CLINICAL DATA:  Possible sepsis EXAM: PORTABLE CHEST 1 VIEW COMPARISON:  08/29/2021 FINDINGS: Cardiac shadow is enlarged but stable. Aortic calcifications are noted. Lungs are clear bilaterally. No bony abnormality is noted. IMPRESSION: No active disease. Electronically Signed   By: Inez Catalina M.D.   On: 06/14/2022 23:16     LOS: 0 days   Oren Binet, MD  [facility]    To contact the attending provider between 7A-7P or the covering provider during after hours 7P-7A, please log into the web site [email or web address] and access using universal [place] password for that web site. If you do not have the password, please call the hospital operator.  06/08/2022, 9:04 AM

## 2022-06-08 NOTE — Progress Notes (Signed)
Pharmacy Antibiotic Note  Shannon [name] is a 89 y.o. female admitted on 06/07/2022 with LLE pain/cellulitis .  Pharmacy has been consulted for Vancomycin dosing. Added unasyn this evening  cr 2.5 crcl approx92m/min  Plan: Unasyn 3gm q12h Vancomycin 750 mg IV q48h  Height: '5\' 6"'$  (167.6 cm) Weight: 59 kg (130 lb) IBW/kg (Calculated) : 59.3  Temp (24hrs), Avg:100.1 F (37.8 C), Min:98.5 F (36.9 C), Max:103.6 F (39.8 C)  Recent Labs  Lab 06/02/22 0822 06/07/22 2245 06/08/22 0103 06/08/22 0630  WBC  --  12.5*  --  9.0  CREATININE 2.00* 2.54*  --  2.58*  LATICACIDVEN  --  1.6 1.8  --      Estimated Creatinine Clearance: 11.9 mL/min (A) (by C-G formula based on SCr of 2.58 mg/dL (H)).    Allergies  Allergen Reactions   Penicillins Itching, Rash and Other (See Comments)    Tolerated amoxicillin, unasyn, zosyn & cephalosporins in the past. Did it involve swelling of the face/tongue/throat, SOB, or low BP? No Did it involve sudden or severe rash/hives, skin peeling, or any reaction on the inside of your mouth or nose? No Did you need to seek medical attention at a hospital or doctor's office? Unknown When did it last happen?      5+ years If all above answers are "NO", may proceed with cephalosporin use.      LBonnita NasutiPharm.D. CPP, BCPS Clinical Pharmacist [phone]/10/2022 6:35 PM

## 2022-06-08 NOTE — Consult Note (Addendum)
[name] NOTE   MRN : [id number]  Reason for Consult: PAD with left LE pain and edema Referring Physician: ED  History of Present Illness: 89 y/o female know to our office and followed by Dr. Stanford Breed.  She has a history of B LE ulcers.   Right LE angiogram demonstrated tibial disease with collateral flow from the peroneal no intervention on 12/24/20.  She had previously undergone angiography in July of 2022 by Dr. Stanford Breed with treatment of left popliteal lesion. She then underwent irrigation and debridement of bilateral foot ulcers by Dr. Jacqualyn Posey on December 28, 2020. Previous right TMA. The left foot ulcer had since healed. She continues to be under the care of Dr. Amalia Hailey for her bilateral foot wounds. These wounds have been present on and off since 2022.    She went on to develop rest pain on the left LE with chronic TMA wounds. She was scheduled for angiogram of the left LE. On 06/02/22 Dr. Stanford Breed performed angioplasty and stenting left SFA, Left tibioperoneal trunk angioplasty, and Left peroneal artery angioplasty.     She is medically managed on ASA, Plavix and Statin.   She states she just didn't feel good and she was not eating.  She felt SOB and asked to come to the hospital.  In general she is comfortable and in no acute distress. She has developed mild edema in the left LE.  She has B LE chronic wounds.         Current Facility-Administered Medications  Medication Dose Route Frequency Provider Last Rate Last Admin   acetaminophen (TYLENOL) tablet 650 mg  650 mg Oral Q6H PRN Hall, Carole N, DO       amLODipine (NORVASC) tablet 5 mg  5 mg Oral Daily Ghimire, Henreitta Leber, MD   5 mg at 06/08/22 1037   aspirin EC tablet 81 mg  81 mg Oral Daily Ghimire, Shanker M, MD       atorvastatin (LIPITOR) tablet 40 mg  40 mg Oral Daily Jonetta Osgood, MD   40 mg at 06/08/22 1037   clopidogrel (PLAVIX) tablet 75 mg  75 mg Oral Daily Ghimire, Henreitta Leber, MD        cycloSPORINE (RESTASIS) 0.05 % ophthalmic emulsion 1 drop  1 drop Both Eyes BID Jonetta Osgood, MD   1 drop at 06/08/22 1040   DULoxetine (CYMBALTA) DR capsule 60 mg  60 mg Oral Daily Jonetta Osgood, MD   60 mg at 06/08/22 1037   ferrous sulfate tablet 325 mg  325 mg Oral q1600 Ghimire, Henreitta Leber, MD       fluticasone (FLONASE) 50 MCG/ACT nasal spray 2 spray  2 spray Each Nare Daily Jonetta Osgood, MD   2 spray at 06/08/22 1038   free water 180 mL  180 mL Per Tube TID Jonetta Osgood, MD       gabapentin (NEURONTIN) capsule 300 mg  300 mg Oral Daily Jonetta Osgood, MD   300 mg at 06/08/22 1036   HYDROmorphone (DILAUDID) injection 0.5 mg  0.5 mg Intravenous Q4H PRN Irene Pap N, DO   0.5 mg at 06/08/22 0328   latanoprost (XALATAN) 0.005 % ophthalmic solution 1 drop  1 drop Both Eyes QHS Ghimire, Henreitta Leber, MD       levothyroxine (SYNTHROID) tablet 100 mcg  100 mcg Oral Q0600 Jonetta Osgood, MD   100 mcg at 06/08/22 1036   loratadine (CLARITIN) tablet 10  mg  10 mg Oral QPM Ghimire, Henreitta Leber, MD       metroNIDAZOLE (FLAGYL) IVPB 500 mg  500 mg Intravenous Q12H Irene Pap N, DO 100 mL/hr at 06/08/22 1046 500 mg at 06/08/22 1046   mometasone-formoterol (DULERA) 200-5 MCG/ACT inhaler 2 puff  2 puff Inhalation BID Jonetta Osgood, MD   2 puff at 06/08/22 1039   oxyCODONE (Oxy IR/ROXICODONE) immediate release tablet 5 mg  5 mg Oral Q6H PRN Irene Pap N, DO       pantoprazole (PROTONIX) injection 40 mg  40 mg Intravenous Q12H Jonetta Osgood, MD   40 mg at 06/08/22 1037   polyethylene glycol (MIRALAX / GLYCOLAX) packet 17 g  17 g Oral Daily PRN Irene Pap N, DO       polyethylene glycol powder (GLYCOLAX/MIRALAX) container 17 g  17 g Oral Daily Jonetta Osgood, MD   17 g at 06/08/22 1038   polyvinyl alcohol (LIQUIFILM TEARS) 1.4 % ophthalmic solution 1 drop  1 drop Both Eyes QID Ghimire, Henreitta Leber, MD       sodium zirconium cyclosilicate (LOKELMA) packet 10 g  10 g  Oral Once Hall, Carole N, DO       Systane Nighttime OINT 1 application   1 application  Both Eyes QHS Ghimire, Henreitta Leber, MD       [START ON 06/10/2022] vancomycin (VANCOREADY) IVPB 750 mg/150 mL  750 mg Intravenous Q48H [name], DO       Current Outpatient Medications  Medication Sig Dispense Refill   acetaminophen (TYLENOL) 500 MG tablet Take 500-1,000 mg by mouth every 6 (six) hours as needed for moderate pain.     amLODipine (NORVASC) 5 MG tablet TAKE 1 TABLET BY MOUTH EVERY DAY 90 tablet 3   ASPIRIN LOW DOSE 81 MG tablet TAKE 1 TABLET (81 MG) BY MOUTH DAILY (Patient taking differently: Take 81 mg by mouth daily.) 90 tablet 3   atorvastatin (LIPITOR) 10 MG tablet TAKE 1 TABLET BY MOUTH EVERY DAY 90 tablet 1   b complex vitamins tablet Take 1 tablet by mouth daily.      cholecalciferol (VITAMIN D3) 25 MCG (1000 UNIT) tablet Take 1,000 Units by mouth daily.     clopidogrel (PLAVIX) 75 MG tablet Take 1 tablet (75 mg total) by mouth daily. 30 tablet 11   cycloSPORINE (RESTASIS) 0.05 % ophthalmic emulsion Place 1 drop into both eyes 2 (two) times daily.     DULoxetine (CYMBALTA) 60 MG capsule Take 1 capsule (60 mg total) by mouth daily. 90 capsule 1   ferrous sulfate 220 (44 Fe) MG/5ML solution TAKE 7.4 MLS (325 MG TOTAL) BY MOUTH DAILY. (Patient taking differently: Place 325 mg into feeding tube daily.) 150 mL 11   fluticasone (FLONASE) 50 MCG/ACT nasal spray SPRAY 2 SPRAYS INTO EACH NOSTRIL EVERY DAY (Patient taking differently: Place 2 sprays into both nostrils daily.) 48 mL 2   furosemide (LASIX) 40 MG tablet TAKE 0.5-1 TABLETS (20-40 MG TOTAL) BY MOUTH SEE ADMIN INSTRUCTIONS. TAKE 20 MG DAILY, MAY INSTEAD TAKE 40 MG DAILY AS NEEDED FOR EDEMA (Patient taking differently: Take 20-40 mg by mouth See admin instructions. Take 20 mg by mouth daily, may take 40 mg daily as needed for edema) 30 tablet 8   gabapentin (NEURONTIN) 300 MG capsule TAKE 1 CAPSULE BY MOUTH TWICE A DAY (Patient taking  differently: Take 300 mg by mouth 2 (two) times daily.) 180 capsule 1   hydrALAZINE (APRESOLINE) 50  MG tablet Take 1 tablet (50 mg total) by mouth 3 (three) times daily. 270 tablet 3   latanoprost (XALATAN) 0.005 % ophthalmic solution Place 1 drop into both eyes at bedtime.     levocetirizine (XYZAL) 5 MG tablet Take 5 mg by mouth every evening.     levothyroxine (SYNTHROID) 100 MCG tablet TAKE 1 TABLET BY MOUTH EVERY DAY 90 tablet 1   loratadine (CLARITIN) 10 MG tablet Take 10 mg by mouth in the morning.     mometasone-formoterol (DULERA) 200-5 MCG/ACT AERO Inhale 2 puffs into the lungs 2 (two) times daily. 13 g 1   Multiple Vitamin (MULTIVITAMIN WITH MINERALS) TABS tablet Take 1 tablet by mouth daily.     Nutritional Supplements (FEEDING SUPPLEMENT, OSMOLITE 1.5 CAL,) LIQD Place 780 mLs into feeding tube See admin instructions. Take 780 ml over a 12 hour period per day 9pm-9am at a rate of 55     nystatin cream (MYCOSTATIN) Apply 1 application  topically 2 (two) times daily as needed for dry skin.     oxyCODONE (OXY IR/ROXICODONE) 5 MG immediate release tablet Take 2.5-5 mg by mouth every 4 (four) hours as needed for severe pain.     pantoprazole (PROTONIX) 40 MG tablet TAKE 1 TABLET BY MOUTH TWICE A DAY 180 tablet 2   Polyethyl Glycol-Propyl Glycol (SYSTANE) 0.4-0.3 % GEL ophthalmic gel Place 1 application  into both eyes 4 (four) times daily.     polyethylene glycol powder (GLYCOLAX/MIRALAX) 17 GM/SCOOP powder Take 17 g by mouth at bedtime as needed for moderate constipation.     Probiotic Product (EQL PROBIOTIC COLON SUPPORT PO) Take 1 capsule by mouth daily.     senna-docusate (SENOKOT-S) 8.6-50 MG tablet Take 1 tablet by mouth at bedtime as needed for mild constipation.     Sodium Chloride-Sodium Bicarb (NETI POT SINUS WASH NA) Place 1 application  into the nose 2 (two) times daily.     Water For Irrigation, Sterile (FREE WATER) SOLN Place 180 mLs into feeding tube 3 (three) times daily.      White Petrolatum-Mineral Oil (SYSTANE NIGHTTIME) OINT Place 1 application into both eyes at bedtime.      albuterol (VENTOLIN HFA) 108 (90 Base) MCG/ACT inhaler Inhale 2 puffs into the lungs every 6 (six) hours as needed for wheezing or shortness of breath. 8 g 2   doxycycline (VIBRA-TABS) 100 MG tablet TAKE 1 TABLET BY MOUTH EVERY DAY (Patient not taking: Reported on 06/08/2022) 28 tablet 1    Pt meds include: Statin :Yes Betablocker: No ASA: Yes Other anticoagulants/antiplatelets: none  Past Medical History:  Diagnosis Date   Acute respiratory failure (HCC)    Anemia    previous blood transfusions   Arthritis    "all over"   Asthma    Atrial flutter (HCC)    Bradycardia    requiring previous d/c of BB and reduction of amiodarone   CAD (coronary artery disease)    nonobstructive per notes   Chronic diastolic CHF (congestive heart failure) ([place])    CKD (chronic kidney disease), stage III ([place])    Complication of blood transfusion    "got the wrong blood type at [facility] in ~ 2015; no adverse reaction that we are aware of"/daughter, [name] (01/27/2016)   COPD (chronic obstructive pulmonary disease) ([place])    Depression    "light case"   DVT (deep venous thrombosis) ([place]) 01/2016   a. LLE DVT 01/2016 - switched from Eliquis to Coumadin.   Dyspnea  with some activity   Gastric stenosis    a. s/p stomach tube   GERD (gastroesophageal reflux disease)    GI bleed    Headache    History of 2019 novel coronavirus disease (COVID-19)    History of blood transfusion    "several" (01/27/2016)   History of stomach ulcers    Hyperlipidemia    Hypertension    Hypothyroidism    On home oxygen therapy    "2L; 9PM til 9AM" (06/27/2017)   PAD (peripheral artery disease) (HCC)    a. prior gangrene, toe amputations, intervention.   PAF (paroxysmal atrial fibrillation) ([facility])    Paraesophageal hernia    Perforated gastric ulcer ([place])    Pneumonia    "a few times" (06/27/2017)    Seasonal allergies    SIADH (syndrome of inappropriate ADH production) ([place])    [name] 01/10/2015   Small bowel obstruction ([place])    "I don't know how many" (01/11/2015)   Stroke ([place])    several- no residual   Type II diabetes mellitus ([place])    "related to prednisone use  for > 20 yr; once predinose stopped; no more DM RX" (01/27/2016)   UTI (urinary tract infection) 02/08/2016   Ventral hernia with bowel obstruction     Past Surgical History:  Procedure Laterality Date   ABDOMINAL AORTOGRAM N/A 09/21/2016   Procedure: Abdominal Aortogram;  Surgeon: Waynetta Sandy, MD;  Location: [facility];  Service: Cardiovascular;  Laterality: N/A;   ABDOMINAL AORTOGRAM W/LOWER EXTREMITY N/A 10/20/2020   Procedure: ABDOMINAL AORTOGRAM W/LOWER EXTREMITY;  Surgeon: Cherre , MD;  Location: [facility];  Service: Cardiovascular;  Laterality: N/A;   ABDOMINAL AORTOGRAM W/LOWER EXTREMITY N/A 06/02/2022   Procedure: ABDOMINAL AORTOGRAM W/LOWER EXTREMITY;  Surgeon: Cherre , MD;  Location: [facility];  Service: Cardiovascular;  Laterality: N/A;   AMPUTATION Left 09/25/2016   Procedure: AMPUTATION DIGIT- LEFT 2ND AND 3RD TOES;  Surgeon: Rosetta Posner, MD;  Location: [facility];  Service: Vascular;  Laterality: Left;   AMPUTATION Right 05/03/2018   Procedure: AMPUTATION RIGHT GREAT TOE;  Surgeon: Rosetta Posner, MD;  [facility];  Service: Vascular;  Laterality: Right;   CATARACT EXTRACTION W/ INTRAOCULAR LENS  IMPLANT, BILATERAL     CHOLECYSTECTOMY OPEN     COLECTOMY     ESOPHAGOGASTRODUODENOSCOPY N/A 01/19/2014   Procedure: ESOPHAGOGASTRODUODENOSCOPY (EGD);  Surgeon: Irene Shipper, MD;  [facility];  Service: Endoscopy;  Laterality: N/A;   ESOPHAGOGASTRODUODENOSCOPY N/A 01/20/2014   Procedure: ESOPHAGOGASTRODUODENOSCOPY (EGD);  Surgeon: Irene Shipper, MD;  Location: [facility];  Service: Endoscopy;  Laterality: N/A;   ESOPHAGOGASTRODUODENOSCOPY N/A  03/19/2014   Procedure: ESOPHAGOGASTRODUODENOSCOPY (EGD);  Surgeon: Milus Banister, MD;  Location: [facility];  Service: Endoscopy;  Laterality: N/A;   ESOPHAGOGASTRODUODENOSCOPY N/A 07/08/2015   Procedure: ESOPHAGOGASTRODUODENOSCOPY (EGD);  Surgeon: Doran Stabler, MD;  [facility];  Service: Endoscopy;  Laterality: N/A;   ESOPHAGOGASTRODUODENOSCOPY (EGD) WITH PROPOFOL N/A 09/15/2015   Procedure: ESOPHAGOGASTRODUODENOSCOPY (EGD) WITH PROPOFOL;  Surgeon: Manus Gunning, MD;  [facility];  Service: Gastroenterology;  Laterality: N/A;   ESOPHAGOGASTRODUODENOSCOPY (EGD) WITH PROPOFOL N/A 10/12/2020   Procedure: ESOPHAGOGASTRODUODENOSCOPY (EGD) WITH PROPOFOL;  Surgeon: Gatha Mayer, MD;  Location: [facility];  Service: Endoscopy;  Laterality: N/A;   FOOT SURGERY     GASTROJEJUNOSTOMY     hx/notes 01/10/2015   GASTROJEJUNOSTOMY N/A 09/23/2015   Procedure: OPEN GASTROJEJUNOSTOMY TUBE PLACEMENT;  Surgeon: Arta Bruce Kinsinger, MD;  [facility];  Service: General;  Laterality: N/A;   GLAUCOMA SURGERY Bilateral    HEMATOMA EVACUATION Right 10/20/2020   Procedure: EVACUATION HEMATOMA RIGHT GROIN;  Surgeon: Cherre , MD;  Location: [facility];  Service: Vascular;  Laterality: Right;   IR CM INJ ANY COLONIC TUBE W/FLUORO  01/05/2017   IR CM INJ ANY COLONIC TUBE W/FLUORO  06/07/2017   IR CM INJ ANY COLONIC TUBE W/FLUORO  11/05/2017   IR CM INJ ANY COLONIC TUBE W/FLUORO  09/18/2018   IR CM INJ ANY COLONIC TUBE W/FLUORO  03/06/2019   IR CM INJ ANY COLONIC TUBE W/FLUORO  03/11/2019   IR GASTR TUBE CONVERT GASTR-JEJ PER W/FL MOD SED  04/30/2020   IR GENERIC HISTORICAL  01/07/2016   IR GJ TUBE CHANGE 01/07/2016 Jacqulynn Cadet, MD [facility]   IR GENERIC HISTORICAL  01/27/2016   IR MECH REMOV OBSTRUC MAT ANY COLON TUBE W/FLUORO 01/27/2016 Markus Daft, MD [facility]   IR GENERIC HISTORICAL  02/07/2016   IR PATIENT EVAL TECH 0-60 MINS Darrell K Allred, PA-C  [facility]   IR GENERIC HISTORICAL  02/08/2016   IR [place] TUBE CHANGE 02/08/2016 Greggory Keen, MD [facility]   IR GENERIC HISTORICAL  01/06/2016   IR [place] TUBE CHANGE 01/06/2016 [facility] OUT REF   IR GENERIC HISTORICAL  05/02/2016   IR CM INJ ANY COLONIC TUBE W/FLUORO 05/02/2016 Arne Cleveland, MD [facility]   IR GENERIC HISTORICAL  05/15/2016   IR [place] TUBE CHANGE 05/15/2016 Sandi Mariscal, MD [facility]   IR GENERIC HISTORICAL  06/28/2016   IR GJ TUBE CHANGE 06/28/2016 [facility]   IR GJ TUBE CHANGE  02/20/2017   IR GJ TUBE CHANGE  05/10/2017   IR GJ TUBE CHANGE  07/06/2017   IR GJ TUBE CHANGE  08/02/2017   IR GJ TUBE CHANGE  10/15/2017   IR GJ TUBE CHANGE  12/26/2017   IR GJ TUBE CHANGE  04/08/2018   IR GJ TUBE CHANGE  06/19/2018   IR GJ TUBE CHANGE  03/03/2019   IR GJ TUBE CHANGE  06/06/2019   IR GJ TUBE CHANGE  09/26/2019   IR GJ TUBE CHANGE  01/09/2020   IR GJ TUBE CHANGE  05/12/2020   IR GJ TUBE CHANGE  10/05/2020   IR GJ TUBE CHANGE  10/14/2020   IR GJ TUBE CHANGE  10/19/2020   IR GJ TUBE CHANGE  02/25/2021   IR GJ TUBE CHANGE  03/23/2021   IR GJ TUBE CHANGE  08/29/2021   IR GJ TUBE CHANGE  08/30/2021   IR GJ TUBE CHANGE  09/05/2021   IR GJ TUBE CHANGE  01/26/2022   IR PATIENT EVAL TECH 0-60 MINS  10/19/2016   IR PATIENT EVAL TECH 0-60 MINS  12/25/2016   IR PATIENT EVAL TECH 0-60 MINS  01/29/2017   IR PATIENT EVAL TECH 0-60 MINS  04/04/2017   IR PATIENT EVAL TECH 0-60 MINS  04/30/2017   IR PATIENT EVAL TECH 0-60 MINS  07/31/2017   IR REPLC DUODEN/JEJUNO TUBE PERCUT W/FLUORO  11/14/2016   IR REPLC GASTRO/COLONIC TUBE PERCUT W/FLUORO  05/31/2021   IRRIGATION AND DEBRIDEMENT FOOT Bilateral 12/26/2020   Procedure: IRRIGATION AND DEBRIDEMENT FOOT AND BONE BIOPSY;  Surgeon: Trula Slade, DPM;  Location: [facility];  Service: Podiatry;  Laterality: Bilateral;   LAPAROTOMY N/A 01/20/2015   Procedure: EXPLORATORY LAPAROTOMY;  Surgeon: Coralie Keens, MD;  Location: [facility];   Service: General;  Laterality: N/A;   LOWER EXTREMITY ANGIOGRAPHY Left 09/21/2016   Procedure: Lower Extremity Angiography;  Surgeon: Waynetta Sandy, MD;  Location: [facility];  Service: Cardiovascular;  Laterality: Left;   LOWER EXTREMITY ANGIOGRAPHY Right 06/27/2017   Procedure: Lower Extremity Angiography;  Surgeon: Waynetta Sandy, MD;  Location: [facility];  Service: Cardiovascular;  Laterality: Right;   LOWER EXTREMITY ANGIOGRAPHY Right 12/24/2020   Procedure: LOWER EXTREMITY ANGIOGRAPHY;  Surgeon: Cherre , MD;  Location: [facility];  Service: Cardiovascular;  Laterality: Right;   LYSIS OF ADHESION N/A 01/20/2015   Procedure: LYSIS OF ADHESIONS < 1 HOUR;  Surgeon: Coralie Keens, MD;  Location: [facility];  Service: General;  Laterality: N/A;   PERIPHERAL VASCULAR BALLOON ANGIOPLASTY Left 09/21/2016   Procedure: Peripheral Vascular Balloon Angioplasty;  Surgeon: Waynetta Sandy, MD;  Location: [facility];  Service: Cardiovascular;  Laterality: Left;  drug coated balloon   PERIPHERAL VASCULAR BALLOON ANGIOPLASTY Right 06/27/2017   Procedure: PERIPHERAL VASCULAR BALLOON ANGIOPLASTY;  Surgeon: Waynetta Sandy, MD;  Location: [facility];  Service: Cardiovascular;  Laterality: Right;  SFA/[id number]   PERIPHERAL VASCULAR BALLOON ANGIOPLASTY  06/02/2022   Procedure: PERIPHERAL VASCULAR BALLOON ANGIOPLASTY;  Surgeon: Cherre , MD;  Location: [facility];  Service: Cardiovascular;;  Tibial   PERIPHERAL VASCULAR INTERVENTION Left 10/20/2020   Procedure: PERIPHERAL VASCULAR INTERVENTION;  Surgeon: Cherre , MD;  Location: [facility];  Service: Cardiovascular;  Laterality: Left;   PERIPHERAL VASCULAR INTERVENTION  06/02/2022   Procedure: PERIPHERAL VASCULAR INTERVENTION;  Surgeon: Cherre , MD;  Location: [facility];  Service: Cardiovascular;;  SFA   TONSILLECTOMY      TRANSMETATARSAL AMPUTATION Right 11/14/2019   Procedure: TRANSMETATARSAL AMPUTATION RIGHT FOOT;  Surgeon: Edrick Kins, DPM;  Location: [facility];  Service: Podiatry;  Laterality: Right;   TUBAL LIGATION     VENTRAL HERNIA REPAIR  2015   incarcerated ventral hernia ([facility] 09/2013)/notes 01/10/2015   WOUND EXPLORATION Right 10/20/2020   Procedure: PRIMARY CLOSURE COMMON FEMORAL ARTERY;  Surgeon: Cherre , MD;  [facility];  Service: Vascular;  Laterality: Right;    Social History Social History   Tobacco Use   Smoking status: Never    Passive exposure: Never   Smokeless tobacco: Former    Types: Snuff    Quit date: 04/03/1980   Tobacco comments:    "used snuff in my younger days"  Vaping Use   Vaping Use: Never used  Substance Use Topics   Alcohol use: Never    Alcohol/week: 0.0 standard drinks of alcohol   Drug use: Never    Family History Family History  Problem Relation Age of Onset   Stroke Mother    Hypertension Mother    Diabetes Brother    Glaucoma Son    Glaucoma Son    Heart attack Neg Hx    Colon cancer Neg Hx    Stomach cancer Neg Hx     Allergies  Allergen Reactions   Penicillins Itching, Rash and Other (See Comments)    Tolerated amoxicillin, unasyn, zosyn & cephalosporins in the past. Did it involve swelling of the face/tongue/throat, SOB, or low BP? No Did it involve sudden or severe rash/hives, skin peeling, or any reaction on the inside of your mouth or nose? No Did you need to seek medical attention at a hospital or doctor's office? Unknown When did it last happen?  5+ years If all above answers are "NO", may proceed with cephalosporin use.      REVIEW OF SYSTEMS  General: '[ ]'$  Weight loss, '[ ]'$  Fever, '[ ]'$  chills Neurologic: '[ ]'$  Dizziness, '[ ]'$  Blackouts, '[ ]'$  Seizure '[ ]'$  Stroke, '[ ]'$  "Mini stroke", '[ ]'$  Slurred speech, '[ ]'$  Temporary blindness; '[ ]'$  weakness in arms or legs, '[ ]'$  Hoarseness '[ ]'$  Dysphagia Cardiac: [ x] Chest pain/pressure, [  x] Shortness of breath at rest '[ ]'$  Shortness of breath with exertion, '[ ]'$  Atrial fibrillation or irregular heartbeat  Vascular: '[ ]'$  Pain in legs with walking, '[ ]'$  Pain in legs at rest, '[ ]'$  Pain in legs at night,  [ x] Non-healing ulcer, '[ ]'$  Blood clot in vein/DVT,   Pulmonary: '[ ]'$  Home oxygen, '[ ]'$  Productive cough, '[ ]'$  Coughing up blood, '[ ]'$  Asthma,  '[ ]'$  Wheezing '[ ]'$  COPD Musculoskeletal:  '[ ]'$  Arthritis, '[ ]'$  Low back pain, '[ ]'$  Joint pain Hematologic: '[ ]'$  Easy Bruising, '[ ]'$  Anemia; '[ ]'$  Hepatitis Gastrointestinal: '[ ]'$  Blood in stool, '[ ]'$  Gastroesophageal Reflux/heartburn, Urinary: '[ ]'$  chronic Kidney disease, '[ ]'$  on HD - '[ ]'$  MWF or '[ ]'$  TTHS, '[ ]'$  Burning with urination, '[ ]'$  Difficulty urinating Skin: '[ ]'$  Rashes, '[ ]'$  Wounds Psychological: '[ ]'$  Anxiety, '[ ]'$  Depression  Physical Examination Vitals:   06/08/22 0600 06/08/22 0845 06/08/22 0933 06/08/22 1037  BP: 115/69 (!) 123/57 123/72 (!) 125/56  Pulse: 65 64 64   Resp: 17 (!) 23 17   Temp: 98.5 F (36.9 C)  100 F (37.8 C)   TempSrc:      SpO2: 100% 95% 100%   Weight:      Height:       Body mass index is 20.98 kg/m.  General:  WDWN in NAD Gait: Normal HENT: WNL Eyes: Pupils equal Pulmonary: normal non-labored breathing , without Rales, rhonchi,  wheezing Cardiac: RRR, without  Murmurs, rubs or gallops; No carotid bruits Abdomen: soft, NT, no masses Skin: no rashes, ulcers noted;  no Gangrene , no cellulitis; no open wounds;   Vascular Exam/Pulses: doppler peroneal right and AT/PT left LE         Musculoskeletal: no muscle wasting or atrophy; mild left LE edema  Neurologic: A&O X 3; Appropriate Affect ;  SENSATION: normal; MOTOR FUNCTION: 5/5 Symmetric Speech is fluent/normal   Significant Diagnostic Studies: CBC Lab Results  Component Value Date   WBC 9.0 06/08/2022   HGB 7.3 (L) 06/08/2022   HCT 22.4 (L) 06/08/2022   MCV 100.0 06/08/2022   PLT 174 06/08/2022    BMET    Component Value Date/Time   NA 132  (L) 06/08/2022 0630   NA 141 05/04/2021 1502   K 4.7 06/08/2022 0630   CL 100 06/08/2022 0630   CO2 24 06/08/2022 0630   GLUCOSE 120 (H) 06/08/2022 0630   BUN 76 (H) 06/08/2022 0630   BUN 69 (H) 05/04/2021 1502   CREATININE 2.58 (H) 06/08/2022 0630   CREATININE 1.46 (H) 12/12/2021 1452   CALCIUM 8.6 (L) 06/08/2022 0630   GFRNONAA 17 (L) 06/08/2022 0630   GFRNONAA 31 (L) 06/02/2020 1126   GFRAA 36 (L) 06/02/2020 1126   Estimated Creatinine Clearance: 11.9 mL/min (A) (by C-G formula based on SCr of 2.58 mg/dL (H)).  COAG Lab Results  Component Value Date   INR 1.2 06/08/2022   INR 1.2 02/14/2021   INR 1.0 10/15/2020  Non-Invasive Vascular Imaging:   ASSESSMENT/PLAN:  PAD with history of chronic foot wounds.  Most recent intervention 06/02/22 by Dr. Stanford Breed Right LE diagnostic angiogram and left LE recent endovascular intervention with angioplasty and stenting left SFA, Left tibioperoneal trunk angioplasty, and Left peroneal artery angioplasty.     Her doppler signals are stable and the wounds do not appear frankly infected.  They are dry.  I think her improved  left LE inflow is contributing to her mild edema.  No vascular intervention is needed.  She has a follow up visit schedule with our office in 3 weeks with new baseline ABI's ordered.  She is vascularly maximized at this time.    She is medically managed on ASA, Plavix and Statin.    Roxy Horseman 06/08/2022 10:55 AM   VASCULAR STAFF ADDENDUM: I have independently interviewed and examined the patient. I agree with the above.  Leg edema mild. No concern for venous or arterial pathology.    Cassandria Santee, MD [facility] Phone Number: [phone] 06/08/2022 3:56 PM

## 2022-06-08 NOTE — ED Notes (Signed)
Pt yelling from her room (call bell is on her lap) yelling at staff and demanding something to eat and drink. Pt provided with snack and beverage. Pt expressed her discontent with our selection of snacks loudly and aggressively. ED tech assisted pt to bathroom at this time as her gait is still unsteady.

## 2022-06-08 NOTE — Progress Notes (Signed)
Pharmacy Antibiotic Note  Shannon [name] is a 89 y.o. female admitted on 06/07/2022 with LLE pain/cellulitis .  Pharmacy has been consulted for Vancomycin dosing.  Plan: Vancomycin 750 mg IV q48h  Height: '5\' 6"'$  (167.6 cm) Weight: 59 kg (130 lb) IBW/kg (Calculated) : 59.3  Temp (24hrs), Avg:100.1 F (37.8 C), Min:98.5 F (36.9 C), Max:103.6 F (39.8 C)  Recent Labs  Lab 06/02/22 0822 06/07/22 2245 06/08/22 0103 06/08/22 0630  WBC  --  12.5*  --  9.0  CREATININE 2.00* 2.54*  --   --   LATICACIDVEN  --  1.6 1.8  --     Estimated Creatinine Clearance: 12.1 mL/min (A) (by C-G formula based on SCr of 2.54 mg/dL (H)).    Allergies  Allergen Reactions   Penicillins Itching, Rash and Other (See Comments)    Tolerated amoxicillin, unasyn, zosyn & cephalosporins in the past. Did it involve swelling of the face/tongue/throat, SOB, or low BP? No Did it involve sudden or severe rash/hives, skin peeling, or any reaction on the inside of your mouth or nose? No Did you need to seek medical attention at a hospital or doctor's office? Unknown When did it last happen?      5+ years If all above answers are "NO", may proceed with cephalosporin use.      [name] 06/08/2022 6:45 AM

## 2022-06-08 NOTE — Progress Notes (Signed)
Pt complaining of sudden SOB and desaturating into the mid to high 80s. Pt placed on nasal cannula and respiratory called. Breathing treatment given, NRB placed, and suctioning required with slow improvement in oxygen saturation. MD notified and rapid response called. CXR ordered. MD at bedside. Pt able to be gradually weaned down to 4L nasal cannula and MD to place orders.   SLP swallow evaluation completed around 1400 and oral medications given around 1700.

## 2022-06-08 NOTE — ED Notes (Signed)
Date and time results received: 06/08/22 05:39 (use smartphrase ".now" to insert current time)  Test: Troponin  Critical Value: 149  Name of Provider Notified: Irene Pap, DO  Orders Received? Or Actions Taken?: No new orders

## 2022-06-08 NOTE — Progress Notes (Signed)
Rapid Response Note  Called to bedside for acute desaturation due to possible aspiration. Patient seen by SLP for swallow eval approximately 1400 and then received PO meds 1700. On RA with sudden desaturation, short of breath, requiring NTS with thick white/blue secretions suctioned. NRB immediately placed. O2 sat as low as 30%s. CXR obtained. MD to bedside. Patient weaned to 4L [place]. Remained A&O during episode.   Candace Cruise RN

## 2022-06-08 NOTE — Consult Note (Signed)
Consultation Note Date: 06/08/2022   Patient Name: Shannon [name]  DOB: 08-Apr-1923  MRN: [id number]  Age / Sex: 89 y.o., female  PCP: Shannon Chandler, NP Referring Physician: Jonetta Osgood, MD  Reason for Consultation: Establishing goals of care  HPI/Patient Profile: 89 y.o. female  with past medical history of peripheral artery disease status post stent placement left leg on Friday 06/02/2022, chronic wounds to bilateral feet, COPD with asthma, type 2 diabetes, diabetic polyneuropathy, essential hypertension, chronic anxiety/depression, hypothyroidism, chronic diastolic CHF, CKD 3B, status post PEG, anemia of chronic disease afib not on AC due to GI bleeds, admitted on 06/13/2022 with left leg pain.   Patient is admitted for sepsis likely from left lower extremity cellulitis, AKI on CKD 3B, acute metabolic encephalopathy, hyperkalemia. PMT has been consulted to assist with goals of care conversation.  Clinical Assessment and Goals of Care:  I have reviewed medical records including EPIC notes, labs and imaging, received report from RN, assessed the patient and then met at the bedside with patient and her daughter Shannon [name] to discuss diagnosis prognosis, [place], EOL wishes, disposition and options.  I introduced Palliative Medicine as specialized medical care for people living with serious illness. It focuses on providing relief from the symptoms and stress of a serious illness. The goal is to improve quality of life for both the patient and the family.  We discussed a brief life review of the patient and then focused on their current illness.  The natural disease trajectory and expectations at EOL were discussed.  I attempted to elicit values and goals of care important to the patient.    Medical History Review and Understanding:  We discussed patient's chronic comorbidities in detail, as well as her acute illnesses  including sepsis and AKI.  Reviewed test results including preliminary vascular ultrasound.  Social History: Patient has been widowed since 2015.  She has 1 daughter and 1 living son, 1 deceased son.  She moved here from [place] to live with her daughter after her husband's death.  She also lives with her son-in-law and granddaughter, who is handicapped.  She is a strong Engineer, manufacturing.  She previously enjoyed knitting and sewing, but can no longer sew due to vision problems.  Functional and Nutritional State: Patient reports using a walker to ambulate.  She tells me she mainly stays in bed and is too weak to turn from side-to-side.  She has a sacral wound as a result.  She reports only drinking beverages and not eating anything by mouth - she gets her tube feeds at night.  Albumin of 2.6 noted on 3/7.  Palliative Symptoms: Cough, pain (sacral wound)  Advance Directives: A detailed discussion regarding advanced directives was had.  Documentation in [place] was thoroughly reviewed.  Primary HCPOA is her daughter Shannon [name], followed by Shannon [name] and Shannon [name].  Code Status: Concepts specific to code status, artifical feeding and hydration, and rehospitalization were considered and discussed.   Discussion: Patient tells me she does not want any more vascular surgeries.  She does not feel good about her current health condition, though she thanks God for the 96 years of life she has had thus far.  She understands that anything can happen at any time and she is at peace with any medical outcomes, while still being hopeful for improvement.  Her primary goal is to continue enjoying her life and ambulating is much as possible.  She would not find it acceptable to be bedbound.  She prefers  her daughter be present for further discussion of treatment options and supports available moving forward. I then called patient's daughter Shannon [name] and provided an update on my conversation with the patient.  We discussed  patient's acute illness and current care plan.  I provided reassurance that palliative care is involved as an extra layer support and she shares that patient previously had palliative care following in the outpatient setting, however she improved enough to be discharged from the program.  She is open to any suggestions to improve patient's comfort and initially tried to visit this morning but the ED parking lot was full.  She plans to return to the hospital again around 3 to 4 PM this afternoon and is agreeable to meet with PMT for goals of care discussion. I met patient and her daughter at the bedside and patient is in considerable more pain than my earlier visit. She received IV dilaudid an hour prior to my arrival without relief. Patient reports pain is around her rib cages bilaterally and it is worse with each breath. I discussed with RN and she noted reaching out to admitting attending MD via secure chat when symptoms arose. I then reached out to Dr. Sloan Leiter to provide an update on patient's symptoms. He stated plan for nebs. While awaiting further management, patient's daughter and I reviewed her current care plan including tube feedings tonight and briefly reviewed a MOST form. Patient is too dyspneic for goals of care conversation at this time and deferred further conversation until tomorrow at 10:30am. Patient and her daughter do not feel she is any better than when she was admitted.     Discussed the importance of continued conversation with family and the medical providers regarding overall plan of care and treatment options, ensuring decisions are within the context of the patient's values and GOCs.   Questions and concerns were addressed.  Hard Choices booklet left for review. The family was encouraged to call with questions or concerns.  PMT will continue to support holistically.   SUMMARY OF RECOMMENDATIONS   -Continue DNR -Continue current care -Patient's primary goal is to improve and  remain ambulatory as long as possible -Spiritual care consult at patient's request -Meet again with patient and her daughter tomorrow 3/8 at 10:30am; patient was too dyspneic for further discussion upon my return to the bedside this afternoon  -Psychosocial and emotional support provided -PMT will continue to follow and support  Prognosis:  Unable to determine  Discharge Planning: To Be Determined      Primary Diagnoses: Present on Admission:  Sepsis [facility])   Physical Exam Vitals and nursing note reviewed.  Constitutional:      General: She is not in acute distress.    Appearance: She is ill-appearing.  Cardiovascular:     Rate and Rhythm: Normal rate.  Pulmonary:     Effort: Pulmonary effort is normal. No respiratory distress.  Skin:    General: Skin is warm and dry.  Neurological:     Mental Status: She is alert and oriented to person, place, and time.  Psychiatric:        Mood and Affect: Mood normal.        Behavior: Behavior normal.    Vital Signs: BP 123/72   Pulse 64   Temp 100 F (37.8 C)   Resp 17   Ht '5\' 6"'$  (1.676 m)   Wt 59 kg   SpO2 100%   BMI 20.98 kg/m  Pain Scale: PAINAD   Pain Score:  4    SpO2: SpO2: 100 % O2 Device:SpO2: 100 % O2 Flow Rate: .    Palliative Assessment/Data: 40 to 50%      Total time: I spent 90 minutes in the care of the patient today in the above activities and documenting the encounter.  MDM: high   Cambren Helm Johnnette Litter, PA-C  Palliative Medicine Team Team phone # [phone]  Thank you for allowing the Palliative Medicine Team to assist in the care of this patient. Please utilize secure chat with additional questions, if there is no response within 30 minutes please call the above phone number.  Palliative Medicine Team providers are available by phone from 7am to 7pm daily and can be reached through the team cell phone.  Should this patient require assistance outside of these hours, please call the  patient's attending physician.

## 2022-06-08 NOTE — ED Notes (Signed)
Pt return from US  

## 2022-06-08 NOTE — Progress Notes (Signed)
Elink monitoring for the code sepsis protocol.  

## 2022-06-08 NOTE — Progress Notes (Signed)
Initial Nutrition Assessment  DOCUMENTATION CODES:   Severe malnutrition in context of chronic illness  INTERVENTION:   Tube Feeds via J-port via GJ-tube: Osmolite 1.5 at 55 mL/hr x 12 hours from 9 pm-9 am. Free water flush: 180 mL - TID Provides 990 kcal, 41 gm protein, and 1043 mL total free water daily. (Meets 66% of calorie and 55% of protein estimated needs) Multivitamin w/ minerals daily  NUTRITION DIAGNOSIS:   Severe Malnutrition related to chronic illness as evidenced by severe fat depletion, severe muscle depletion.  GOAL:   Patient will meet greater than or equal to 90% of their needs  MONITOR:   PO intake, Labs, I & O's, TF tolerance  REASON FOR ASSESSMENT:   Consult Assessment of nutrition requirement/status, Enteral/tube feeding initiation and management  ASSESSMENT:   89 y.o. female presented to the ED with LLE pain. PMH includes T2DM, COPD, CHF, CKD IIIb, dysphagia s/p J-tube, GERD, HTN, HLD, and PAD. Pt admitted with sepsis with concern for LLE cellulitis.   Met with pt in room. Pt reports that she takes in some PO by mouth and then receives Osmolite via the J-tube at night while sleeping. She can eat yogurt, pudding, jello, water, ice cream, ginger ale, cola, and cream of chicken soup. SLP entered to trial PO with pt as RD was finishing exam. RD was unable to ask pt about weight history. Suspect that current weight is inaccurate and carried from last weight.   Discussed pt tube feeds regimen with RN. Asked RD to obtain new weight as well.  Pending pt PO intake inpatient, would recommend increasing tube feeds at home due to severe malnutrition.   Medications reviewed and include: Ferrous Sulfate, Levothyroxine, Protonix, Miralax, Lokelma, IV antibiotics Labs reviewed: Sodium 132 (L), Potassium 4.7, BUN 76 (H), Creatinine 2.58 (H), Phosphorus 4.0, Magnesium 2.1, Hgb A1c 6.0% (12/12/21)  NUTRITION - FOCUSED PHYSICAL EXAM:  Flowsheet Row Most Recent Value   Orbital Region Severe depletion  Upper Arm Region Moderate depletion  Thoracic and Lumbar Region Mild depletion  Buccal Region Severe depletion  Temple Region Moderate depletion  Clavicle Bone Region Severe depletion  Clavicle and Acromion Bone Region Severe depletion  Scapular Bone Region Severe depletion  Dorsal Hand Severe depletion  Patellar Region Severe depletion  Anterior Thigh Region Severe depletion  Posterior Calf Region Severe depletion  Hair Reviewed  Eyes Reviewed  Mouth Reviewed  Skin Reviewed  Nails Reviewed   Diet Order:   Diet Order             Diet full liquid Room service appropriate? Yes with Assist; Fluid consistency: Thin  Diet effective now                   EDUCATION NEEDS:   No education needs have been identified at this time  Skin:  Skin Assessment: Reviewed RN Assessment  Last BM:  Unknown  Height:   Ht Readings from Last 1 Encounters:  06/07/22 '5\' 6"'$  (1.676 m)    Weight:   Wt Readings from Last 1 Encounters:  06/07/22 59 kg    Ideal Body Weight:  59.1 kg  BMI:  Body mass index is 20.98 kg/m.  Estimated Nutritional Needs:  Kcal:  1500-1700 Protein:  75-90 grams Fluid:  >/= 1.5 L   Hermina Barters RD, LDN Clinical Dietitian See [facility] for contact information.

## 2022-06-08 NOTE — ED Notes (Signed)
Patient transported to Ultrasound 

## 2022-06-08 NOTE — Progress Notes (Signed)
ANTICOAGULATION CONSULT NOTE - Initial Consult  Pharmacy Consult for Heparin Indication: chest pain/ACS  Allergies  Allergen Reactions   Penicillins Itching, Rash and Other (See Comments)    Tolerated amoxicillin, unasyn, zosyn & cephalosporins in the past. Did it involve swelling of the face/tongue/throat, SOB, or low BP? No Did it involve sudden or severe rash/hives, skin peeling, or any reaction on the inside of your mouth or nose? No Did you need to seek medical attention at a hospital or doctor's office? Unknown When did it last happen?      5+ years If all above answers are "NO", may proceed with cephalosporin use.     Patient Measurements: Height: '5\' 6"'$  (167.6 cm) Weight: 59 kg (130 lb) IBW/kg (Calculated) : 59.3  Vital Signs: Temp: 99.4 F (37.4 C) (03/07 0217) Temp Source: Rectal (03/07 0217) BP: 115/69 (03/07 0600) Pulse Rate: 65 (03/07 0600)  Labs: Recent Labs    06/19/2022 2245 06/08/22 0103 06/08/22 0454  HGB 8.3*  --   --   HCT 25.3*  --   --   PLT 213  --   --   APTT 33  --   --   LABPROT 15.2  --   --   INR 1.2  --   --   CREATININE 2.54*  --   --   TROPONINIHS 98* 109* 149*    Estimated Creatinine Clearance: 12.1 mL/min (A) (by C-G formula based on SCr of 2.54 mg/dL (H)).   Medical History: Past Medical History:  Diagnosis Date   Acute respiratory failure ([place])    Anemia    previous blood transfusions   Arthritis    "all over"   Asthma    Atrial flutter (HCC)    Bradycardia    requiring previous d/c of BB and reduction of amiodarone   CAD (coronary artery disease)    nonobstructive per notes   Chronic diastolic CHF (congestive heart failure) ([place])    CKD (chronic kidney disease), stage III ([place])    Complication of blood transfusion    "got the wrong blood type at [facility] in ~ 2015; no adverse reaction that we are aware of"/daughter, [name] (01/27/2016)   COPD (chronic obstructive pulmonary disease) ([place])    Depression    "light  case"   DVT (deep venous thrombosis) ([place]) 01/2016   a. LLE DVT 01/2016 - switched from Eliquis to Coumadin.   Dyspnea    with some activity   Gastric stenosis    a. s/p stomach tube   GERD (gastroesophageal reflux disease)    GI bleed    Headache    History of 2019 novel coronavirus disease (COVID-19)    History of blood transfusion    "several" (01/27/2016)   History of stomach ulcers    Hyperlipidemia    Hypertension    Hypothyroidism    On home oxygen therapy    "2L; 9PM til 9AM" (06/27/2017)   PAD (peripheral artery disease) (H[facility])    a. prior gangrene, toe amputations, intervention.   PAF (paroxysmal atrial fibrillation) ([place])    Paraesophageal hernia    Perforated gastric ulcer ([place])    Pneumonia    "a few times" (06/27/2017)   Seasonal allergies    SIADH (syndrome of inappropriate ADH production) ([place])    [name] 01/10/2015   Small bowel obstruction ([place])    "I don't know how many" (01/11/2015)   Stroke ([place])    several- no residual   Type II diabetes mellitus ([place])    "  related to prednisone use  for > 20 yr; once predinose stopped; no more DM RX" (01/27/2016)   UTI (urinary tract infection) 02/08/2016   Ventral hernia with bowel obstruction     Medications:  No current facility-administered medications on file prior to encounter.   Current Outpatient Medications on File Prior to Encounter  Medication Sig Dispense Refill   acetaminophen (TYLENOL) 500 MG tablet Take 500-1,000 mg by mouth every 6 (six) hours as needed for moderate pain.     albuterol (VENTOLIN HFA) 108 (90 Base) MCG/ACT inhaler Inhale 2 puffs into the lungs every 6 (six) hours as needed for wheezing or shortness of breath. 8 g 2   amLODipine (NORVASC) 5 MG tablet TAKE 1 TABLET BY MOUTH EVERY DAY 90 tablet 3   ASPIRIN LOW DOSE 81 MG tablet TAKE 1 TABLET (81 MG) BY MOUTH DAILY 90 tablet 3   atorvastatin (LIPITOR) 10 MG tablet TAKE 1 TABLET BY MOUTH EVERY DAY 90 tablet 1   b complex vitamins tablet Take  1 tablet by mouth daily.      cholecalciferol (VITAMIN D3) 25 MCG (1000 UNIT) tablet Take 1,000 Units by mouth daily.     clopidogrel (PLAVIX) 75 MG tablet Take 1 tablet (75 mg total) by mouth daily. 30 tablet 11   cycloSPORINE (RESTASIS) 0.05 % ophthalmic emulsion Place 1 drop into both eyes 2 (two) times daily.     doxycycline (VIBRA-TABS) 100 MG tablet TAKE 1 TABLET BY MOUTH EVERY DAY 28 tablet 1   DULoxetine (CYMBALTA) 60 MG capsule Take 1 capsule (60 mg total) by mouth daily. 90 capsule 1   ferrous sulfate 220 (44 Fe) MG/5ML solution TAKE 7.4 MLS (325 MG TOTAL) BY MOUTH DAILY. 150 mL 11   fluticasone (FLONASE) 50 MCG/ACT nasal spray SPRAY 2 SPRAYS INTO EACH NOSTRIL EVERY DAY 48 mL 2   furosemide (LASIX) 40 MG tablet TAKE 0.5-1 TABLETS (20-40 MG TOTAL) BY MOUTH SEE ADMIN INSTRUCTIONS. TAKE 20 MG DAILY, MAY INSTEAD TAKE 40 MG DAILY AS NEEDED FOR EDEMA 30 tablet 8   gabapentin (NEURONTIN) 300 MG capsule TAKE 1 CAPSULE BY MOUTH TWICE A DAY 180 capsule 1   gentamicin cream (GARAMYCIN) 0.1 % Apply 1 application topically daily as needed (dry skin). 30 g 1   hydrALAZINE (APRESOLINE) 50 MG tablet Take 1 tablet (50 mg total) by mouth 3 (three) times daily. 270 tablet 3   latanoprost (XALATAN) 0.005 % ophthalmic solution Place 1 drop into both eyes at bedtime.     levocetirizine (XYZAL) 5 MG tablet Take 5 mg by mouth every evening.     levothyroxine (SYNTHROID) 100 MCG tablet TAKE 1 TABLET BY MOUTH EVERY DAY 90 tablet 1   loratadine (CLARITIN) 10 MG tablet Take 10 mg by mouth daily.     mometasone-formoterol (DULERA) 200-5 MCG/ACT AERO Inhale 2 puffs into the lungs 2 (two) times daily. 13 g 1   Multiple Vitamin (MULTIVITAMIN WITH MINERALS) TABS tablet Take 1 tablet by mouth daily.     Nutritional Supplements (FEEDING SUPPLEMENT, OSMOLITE 1.5 CAL,) LIQD Place 780 mLs into feeding tube See admin instructions. Take 780 ml over a 12 hour period per day 9pm-9am at a rate of 55     nystatin (MYCOSTATIN)  100000 UNIT/ML suspension Take 5 mLs (500,000 Units total) by mouth 4 (four) times daily. (Patient not taking: Reported on 06/01/2022) 60 mL 0   nystatin cream (MYCOSTATIN) Apply 1 application  topically 2 (two) times daily as needed for dry skin.  oxyCODONE (OXY IR/ROXICODONE) 5 MG immediate release tablet Take 2.5-5 mg by mouth every 4 (four) hours as needed for severe pain.     pantoprazole (PROTONIX) 40 MG tablet TAKE 1 TABLET BY MOUTH TWICE A DAY 180 tablet 2   Polyethyl Glycol-Propyl Glycol (SYSTANE) 0.4-0.3 % GEL ophthalmic gel Place 1 application  into both eyes 4 (four) times daily.     polyethylene glycol powder (GLYCOLAX/MIRALAX) 17 GM/SCOOP powder Take 17 g by mouth at bedtime as needed for moderate constipation.     Probiotic Product (EQL PROBIOTIC COLON SUPPORT PO) Take 1 capsule by mouth daily.     senna-docusate (SENOKOT-S) 8.6-50 MG tablet Take 1 tablet by mouth at bedtime as needed for mild constipation.     Sodium Chloride-Sodium Bicarb (NETI POT SINUS WASH NA) Place 1 application  into the nose 2 (two) times daily.     Water For Irrigation, Sterile (FREE WATER) SOLN Place 180 mLs into feeding tube 3 (three) times daily.     White Petrolatum-Mineral Oil (SYSTANE NIGHTTIME) OINT Place 1 application into both eyes at bedtime.        Assessment: 89 y.o. female with elevated troponin, possible ACS, for heparin Goal of Therapy:  Heparin level 0.3-0.7 units/ml Monitor platelets by anticoagulation protocol: Yes   Plan:  Heparin 2000 units IV bolus, then start heparin 700 units/hr Check heparin level in 8 hours.    [name] 06/08/2022,6:25 AM

## 2022-06-08 NOTE — Progress Notes (Signed)
This chaplain responded to PMT PA-Josseline's consult for spiritual care. The Pt. is awake and conversational with intermittent pauses for her SOB.   The chaplain listened reflectively as the Pt. shared the events leading up to the decision to go to the emergency room. The chaplain understands the day was out of the ordinary for her.   The Pt. told stories of her family and faith. The chaplain understands the Pt. leans on God in her daily life and has fond memories of her church in [place]. The Pt. lives with her daughter, both daughter and granddaughter who is an Therapist, sports were impactful in the Pt. visit to the ER.  The Pt. shares knitting replaces watching TV as her favorite past time and gift for others.   The Pt. accepted the chaplain invitation for prayer and F/U spiritual care.  Chaplain Sallyanne Kuster [phone]

## 2022-06-09 ENCOUNTER — Inpatient Hospital Stay (HOSPITAL_COMMUNITY): Payer: Medicare Other

## 2022-06-09 DIAGNOSIS — I10 Essential (primary) hypertension: Secondary | ICD-10-CM

## 2022-06-09 DIAGNOSIS — I480 Paroxysmal atrial fibrillation: Secondary | ICD-10-CM | POA: Diagnosis not present

## 2022-06-09 DIAGNOSIS — N179 Acute kidney failure, unspecified: Secondary | ICD-10-CM | POA: Diagnosis not present

## 2022-06-09 DIAGNOSIS — A419 Sepsis, unspecified organism: Secondary | ICD-10-CM | POA: Diagnosis not present

## 2022-06-09 LAB — GLUCOSE, CAPILLARY
Glucose-Capillary: 219 mg/dL — ABNORMAL HIGH (ref 70–99)
Glucose-Capillary: 225 mg/dL — ABNORMAL HIGH (ref 70–99)
Glucose-Capillary: 161 mg/dL — ABNORMAL HIGH (ref 70–99)
Glucose-Capillary: 124 mg/dL — ABNORMAL HIGH (ref 70–99)
Glucose-Capillary: 176 mg/dL — ABNORMAL HIGH (ref 70–99)
Glucose-Capillary: 187 mg/dL — ABNORMAL HIGH (ref 70–99)

## 2022-06-09 LAB — CBC
HCT: 23.3 % — ABNORMAL LOW (ref 36.0–46.0)
Hemoglobin: 7.7 g/dL — ABNORMAL LOW (ref 12.0–15.0)
MCH: 32.8 pg (ref 26.0–34.0)
MCHC: 33 g/dL (ref 30.0–36.0)
MCV: 99.1 fL (ref 80.0–100.0)
Platelets: 172 10*3/uL (ref 150–400)
RBC: 2.35 MIL/uL — ABNORMAL LOW (ref 3.87–5.11)
RDW: 15.7 % — ABNORMAL HIGH (ref 11.5–15.5)
WBC: 7.6 10*3/uL (ref 4.0–10.5)
nRBC: 0 % (ref 0.0–0.2)

## 2022-06-09 LAB — BASIC METABOLIC PANEL
Anion gap: 12 (ref 5–15)
BUN: 72 mg/dL — ABNORMAL HIGH (ref 8–23)
CO2: 20 mmol/L — ABNORMAL LOW (ref 22–32)
Calcium: 8.4 mg/dL — ABNORMAL LOW (ref 8.9–10.3)
Chloride: 101 mmol/L (ref 98–111)
Creatinine, Ser: 2.45 mg/dL — ABNORMAL HIGH (ref 0.44–1.00)
GFR, Estimated: 18 mL/min — ABNORMAL LOW (ref >60)
Glucose, Bld: 240 mg/dL — ABNORMAL HIGH (ref 70–99)
Potassium: 4.6 mmol/L (ref 3.5–5.1)
Sodium: 133 mmol/L — ABNORMAL LOW (ref 135–145)

## 2022-06-09 LAB — HEPARIN LEVEL (UNFRACTIONATED): Heparin Unfractionated: <0.1 IU/mL — ABNORMAL LOW (ref 0.30–0.70)

## 2022-06-09 MED ORDER — ACETAMINOPHEN 325 MG PO TABS: 650.0000 mg | ORAL_TABLET | Freq: Four times a day (QID) | ORAL | Status: DC | PRN

## 2022-06-09 MED ORDER — FREE WATER
140.0000 mL | Freq: Three times a day (TID) | Status: DC
  Administered 2022-06-09 – 2022-06-13 (×12): 140 mL

## 2022-06-09 MED ORDER — OSMOLITE 1.2 CAL PO LIQD
1000.0000 mL | ORAL | Status: DC
  Administered 2022-06-09 – 2022-06-12 (×3): 1000 mL via JEJUNOSTOMY
  Filled 2022-06-09 (×6): qty 1000

## 2022-06-09 MED ORDER — AMLODIPINE BESYLATE 5 MG PO TABS
5.0000 mg | ORAL_TABLET | Freq: Every day | ORAL | Status: DC
  Administered 2022-06-10 – 2022-06-12 (×3): 5 mg
  Filled 2022-06-09 (×3): qty 1

## 2022-06-09 NOTE — Evaluation (Signed)
Occupational Therapy Evaluation Patient Details Name: Shannon [name] MRN: [id number] DOB: 12-03-1923 Today's Date: 06/09/2022   History of Present Illness Pt is a 89 year old woman who presented to ED on 3/6 with AMS, fever and generalized pain. + L LE cellulitis and sepsis. PMH: L SFA stent placed 3/1, R transtarsal amputation, PAD, HTN afib, g-tube 2* bowel obstructions, CADm CKD, COPD, diabetic polyneuropathy, anxiety, depression.   Clinical Impression   Pt lives with her daughter's family and walks with a rollator. She is independent in self care and showers weekly on her tub bench. She is not responsible for meal prep or housekeeping. Pt presents with generalized weakness, poor activity tolerance and impaired standing balance. Pt requires +2 mod assist to EOB and +2 min assist to stand with posterior bias. She transfers with min assist and increased time. Pt needs set up to total assist for ADLs. Pt in afib with HR to 122 upon sitting in chair, SpO2 94% on 2L. Recommending HHOT upon discharge.      Recommendations for follow up therapy are one component of a multi-disciplinary discharge planning process, led by the attending physician.  Recommendations may be updated based on patient status, additional functional criteria and insurance authorization.   Follow Up Recommendations  Home health OT     Assistance Recommended at Discharge Frequent or constant Supervision/Assistance  Patient can return home with the following Two people to help with walking and/or transfers;A lot of help with bathing/dressing/bathroom;Assistance with cooking/housework;Direct supervision/assist for medications management;Direct supervision/assist for financial management;Assist for transportation;Help with stairs or ramp for entrance    Functional Status Assessment  Patient has had a recent decline in their functional status and demonstrates the ability to make significant improvements in function in a reasonable  and predictable amount of time.  Equipment Recommendations  None recommended by OT    Recommendations for Other Services       Precautions / Restrictions Precautions Precautions: Fall Precaution Comments: chronic afib, g-tube      Mobility Bed Mobility Overal bed mobility: Needs Assistance Bed Mobility: Supine to Sit     Supine to sit: +2 for physical assistance, Mod assist     General bed mobility comments: HOB up, pt able to initiate LEs toward [facility], opted to assist pt to conserve energy for standing/transfers    Transfers Overall transfer level: Needs assistance Equipment used: Rollator (4 wheels) Transfers: Sit to/from Stand, Bed to chair/wheelchair/BSC Sit to Stand: +2 physical assistance, Min assist     Step pivot transfers: Min assist, +2 safety/equipment     General transfer comment: assist to block feet from sliding, to rise and steady, pt aware of how to use brakes, pt requires increased time      Balance Overall balance assessment: Needs assistance   Sitting balance-Leahy Scale: Fair     Standing balance support: Bilateral upper extremity supported Standing balance-Leahy Scale: Poor Standing balance comment: B UE support and min assist                           ADL either performed or assessed with clinical judgement   ADL Overall ADL's : Needs assistance/impaired Eating/Feeding: NPO   Grooming: Set up;Sitting   Upper Body Bathing: Minimal assistance;Sitting   Lower Body Bathing: Maximal assistance;Sit to/from stand   Upper Body Dressing : Minimal assistance;Bed level   Lower Body Dressing: Total assistance;Sitting/lateral leans Lower Body Dressing Details (indicate cue type and reason): socks Toilet Transfer:  Minimal assistance;+2 for safety/equipment;Stand-pivot;Rollator (4 wheels) Toilet Transfer Details (indicate cue type and reason): simulated bed to chair Toileting- Clothing Manipulation and Hygiene: Maximal assistance;Sit  to/from stand         General ADL Comments: Pt fatigues easily.     Vision Patient Visual Report: No change from baseline       Perception     Praxis      Pertinent Vitals/Pain Pain Assessment Pain Assessment: Faces Faces Pain Scale: No hurt     Hand Dominance Right   Extremity/Trunk Assessment Upper Extremity Assessment Upper Extremity Assessment: Overall WFL for tasks assessed;Generalized weakness   Lower Extremity Assessment Lower Extremity Assessment: Defer to PT evaluation   Cervical / Trunk Assessment Cervical / Trunk Assessment: Kyphotic   Communication Communication Communication: HOH   Cognition Arousal/Alertness: Awake/alert Behavior During Therapy: WFL for tasks assessed/performed Overall Cognitive Status: Within Functional Limits for tasks assessed                                       General Comments       Exercises     Shoulder Instructions      Home Living Family/patient expects to be discharged to:: Private residence Living Arrangements: Children Available Help at Discharge: Family;Available 24 hours/day Type of Home: House Home Access: Ramped entrance     Home Layout: One level     Bathroom Shower/Tub: Teacher, early years/pre: Standard     Home Equipment: Rollator (4 wheels);Tub bench;BSC/3in1;Wheelchair - manual          Prior Functioning/Environment Prior Level of Function : Needs assist             Mobility Comments: walks with rollator ADLs Comments: sits to shower weekly, independent in self care, assist for IADLs        OT Problem List: Impaired balance (sitting and/or standing);Decreased activity tolerance;Decreased knowledge of use of DME or AE;Cardiopulmonary status limiting activity;Decreased strength      OT Treatment/Interventions: Self-care/ADL training;DME and/or AE instruction;Therapeutic activities;Patient/family education;Balance training    OT Goals(Current goals can  be found in the care plan section) Acute Rehab OT Goals OT Goal Formulation: With patient Time For Goal Achievement: 06/23/22 Potential to Achieve Goals: Good ADL Goals Pt Will Perform Grooming: with supervision;standing Pt Will Perform Lower Body Bathing: sit to/from stand;with min assist Pt Will Perform Lower Body Dressing: with min assist;sit to/from stand Pt Will Transfer to Toilet: with supervision;ambulating;bedside commode Pt Will Perform Toileting - Clothing Manipulation and hygiene: with supervision;sit to/from stand Additional ADL Goal #1: Pt will perform bed mobility with min assist in preparation for ADLs.  OT Frequency: Min 2X/week    Co-evaluation PT/OT/SLP Co-Evaluation/Treatment: Yes Reason for Co-Treatment: For patient/therapist safety   OT goals addressed during session: ADL's and self-care      AM-PAC OT "6 Clicks" Daily Activity     Outcome Measure Help from another person eating meals?: None Help from another person taking care of personal grooming?: A Little Help from another person toileting, which includes using toliet, bedpan, or urinal?: Total Help from another person bathing (including washing, rinsing, drying)?: A Lot Help from another person to put on and taking off regular upper body clothing?: A Little Help from another person to put on and taking off regular lower body clothing?: Total 6 Click Score: 14   End of Session Equipment Utilized During Treatment: Rolling walker (  2 wheels);Oxygen (2L) Nurse Communication: Mobility status;Other (comment) (HR to 122, SpO2 94% on 2L)  Activity Tolerance: Patient tolerated treatment well Patient left: in chair;with call bell/phone within reach;with chair alarm set  OT Visit Diagnosis: Unsteadiness on feet (R26.81);Other abnormalities of gait and mobility (R26.89);Muscle weakness (generalized) (M62.81)                Time: [id number] OT Time Calculation (min): 31 min Charges:  OT General Charges $OT Visit: 1  Visit OT Evaluation $OT Eval Moderate Complexity: [place], OTR/L Acute Rehabilitation Services Office: [phone]   [name] 06/09/2022, 2:55 PM

## 2022-06-09 NOTE — Progress Notes (Signed)
Daily Progress Note   Patient Name: Shannon [name]       Date: 06/09/2022 DOB: 1923-10-15  Age: 89 y.o. MRN#: [id number] Attending Physician: Jonetta Osgood, MD Primary Care Physician: [name], NP Admit Date: 06/06/2022  Reason for Consultation/Follow-up: Establishing goals of care  Subjective: Medical records reviewed including progress notes, labs, imaging. Patient assessed at the bedside.  She reports feeling better than yesterday.  Her daughter [name] is present visiting for scheduled family meeting.  Discussed with RN.  Created space and opportunity for patient and family's thoughts and feeling her current illness.  We reviewed last night's events with aspiration episode as well as patient's desire to take pills by mouth this morning.  Encouraged patient and family to consider treatment options and risks with feeding in light of her goals and priorities.  Patient confirms again today she would like to continue getting stronger and ambulate as long as she can.  Her goal is to return home when she does not want SNF.  We discussed the importance of PT/OT recommendations which are to be determined.  An extensive conversation was had, covering concepts specific to code status, artifical feeding and hydration, continued IV antibiotics and rehospitalization.  Patient's daughter encourages her to consider ICU admission if indicated and she agrees, however she would not want intubation or dialysis.  She also wishes to remain DNR.  A MOST form was completed accordingly.  Outpatient palliative care was explained and offered.  Patient and family are not interested at this time.  They previously had [facility]care following in the outpatient setting but she improved and this was not thought to be  helpful.  Questions and concerns addressed. PMT will continue to support holistically.   Length of Stay: 1   Physical Exam Vitals and nursing note reviewed.  Constitutional:      General: She is not in acute distress. Cardiovascular:     Rate and Rhythm: Rhythm irregular.  Pulmonary:     Effort: Pulmonary effort is normal. No respiratory distress.  Neurological:     Mental Status: She is alert and oriented to person, place, and time. Mental status is at baseline.     Motor: Weakness present.  Psychiatric:        Mood and Affect: Mood normal.  Behavior: Behavior normal.            Vital Signs: BP (!) 117/58 (BP Location: Left Arm)   Pulse 98   Temp 98.7 F (37.1 C) (Oral)   Resp 18   Ht '5\' 6"'$  (1.676 m)   Wt 58.3 kg   SpO2 98%   BMI 20.74 kg/m  SpO2: SpO2: 98 % O2 Device: O2 Device: Nasal Cannula O2 Flow Rate: O2 Flow Rate (L/min): 3 L/min      Palliative Assessment/Data: 40-50%   Palliative Care Assessment & Plan   Patient Profile: 89 y.o. female  with past medical history of peripheral artery disease status post stent placement left leg on Friday 06/02/2022, chronic wounds to bilateral feet, COPD with asthma, type 2 diabetes, diabetic polyneuropathy, essential hypertension, chronic anxiety/depression, hypothyroidism, chronic diastolic CHF, CKD 3B, status post PEG, anemia of chronic disease afib not on AC due to GI bleeds, admitted on 06/20/2022 with left leg pain.    Patient is admitted for sepsis likely from left lower extremity cellulitis, AKI on CKD 3B, acute metabolic encephalopathy, hyperkalemia. PMT has been consulted to assist with goals of care conversation.  Assessment: Goals of care conversation Dysphagia, status post PEG Sepsis, improving Acute hypoxic respiratory failure due to aspiration pneumonia, stable AKI on CKD 3B, improving A-fib Debility and frailty  Recommendations/Plan: Continue DNR/DNI Continue current care.  Patient would be  agreeable to further hospitalizations and ICU admission if she were to decline further MOST form completed today.  Patient's daughter was provided with a copy and original form was placed on hard chart.  Will also scan copy into EMR Outpatient palliative care declined at this time Psychosocial emotional support provided PMT will continue to follow peripherally and support as needed   Prognosis:  Unable to determine  Discharge Planning: Home with [place] was discussed with patient, patient's daughter, RN, Dr. Sloan Leiter   Total time: I spent 70 minutes in the care of the patient today in the above activities and documenting the encounter.  MDM high         Rachelanne Whidby Johnnette Litter, PA-C  Palliative Medicine Team Team phone # [phone]  Thank you for allowing the Palliative Medicine Team to assist in the care of this patient. Please utilize secure chat with additional questions, if there is no response within 30 minutes please call the above phone number.  Palliative Medicine Team providers are available by phone from 7am to 7pm daily and can be reached through the team cell phone.  Should this patient require assistance outside of these hours, please call the patient's attending physician.

## 2022-06-09 NOTE — Consult Note (Signed)
   [facility] Inpatient Consult   06/09/2022  Shannon [name] 1924-03-08 [id number]  [place] [facility] [ACO] Patient: Medicare ACO REACH  Primary Care Provider:  [name], NP with [facility]  Patient was assessed for [place] [facility] for community services. Patient was previously active with [facility].  Met with patient at bedside regarding being restarted with [facility] services. Patient states, "I don't mind, I am currently living with my daughter and she is taking good care of me."  Explained that will follow for disposition and progress currently recommended for home with home health.  Patient appears frail at this time.  Plan:  Continue to follow for post hospital needs.  Of note, [facility] Care Management services does not replace or interfere with any services that are arranged by inpatient [facility] care management team.   For additional questions or referrals please contact:  [name], RN BSN [place]  [phone] business mobile phone Toll free office [phone]  *[place]  [phone] Fax number: [phone] [email or web address]

## 2022-06-09 NOTE — Progress Notes (Addendum)
PROGRESS NOTE        PATIENT DETAILS Name: Shannon [name] Age: 89 y.o. Sex: female Date of Birth: 12-21-1923 Admit Date: 06/05/2022 Admitting Physician [name], DO [id number], [name], NP  Brief Summary: Patient is a 89 y.o.  female PAD-s/p recent angioplasty/PCI on 3/1, asthma/COPD overlap, DM-2, HTN, chronic HFpEF, CKD stage IIIb, dysphagia-s/p PEG tube placement-on nocturnal feedings-who presented with weakness/confusion, fever, LLE swelling/pain-she was found to have probable LLE cellulitis with AKI-and subsequently admitted to the hospitalist service.  Significant events: 3/6>> admit to [facility] 3/7>> episode of aspiration-requiring deep nasotracheal suctioning/oxygen.  Kept NPO. 3/8>> A-fib on telemetry monitor  Significant studies: 3/6>> CXR: No PNA 3/7>> CT abdomen/pelvis: No acute findings. 3/7>> echo: EF [id number], grade 2 diastolic dysfunction. 3/7>> bilateral lower extremity Doppler: No DVT. 3/8>> CXR: Right upper lobe atelectasis.  Significant microbiology data: 3/6>> COVID/influenza/RSV PCR: Negative 3/6>> blood culture: No growth  Procedures: None  Consults: VVS Palliative care  Subjective: No major issues as overnight-stable on 4 L.  Objective: Vitals: Blood pressure (!) 117/58, pulse 98, temperature 98.7 F (37.1 C), temperature source Oral, resp. rate 18, height '5\' 6"'$  (1.676 m), weight 58.3 kg, SpO2 98 %.   Exam: Gen Exam:Alert awake-not in any distress.  Frail HEENT:atraumatic, normocephalic Chest: B/L clear to auscultation anteriorly CVS:S1S2 regular Abdomen:soft non tender, non distended Extremities: Minimal LLE erythema-hardly any edema today.  Improved from 3/7. Neurology: Non focal Skin: no rash  Pertinent Labs/Radiology:    Latest Ref Rng & Units 06/09/2022    8:42 AM 06/08/2022    6:27 PM 06/08/2022    6:30 AM  CBC  WBC 4.0 - 10.5 K/uL 7.6  10.3  9.0   Hemoglobin 12.0 - 15.0 g/dL 7.7  8.4  7.3   Hematocrit  36.0 - 46.0 % 23.3  25.0  22.4   Platelets 150 - 400 K/uL 172  189  174     Lab Results  Component Value Date   NA 133 (L) 06/09/2022   K 4.6 06/09/2022   CL 101 06/09/2022   CO2 20 (L) 06/09/2022      Assessment/Plan: Sepsis likely from LLE cellulitis Sepsis physiology improved Minimal erythema/edema today-better than 3/7 Continue IV vancomycin Dopplers negative for DVT   Acute hypoxic respiratory failure due to aspiration pneumonia Had aspiration episode on 3/7-briefly required NRB-nasotracheal suctioning-now stable on just 4 L of oxygen Keep n.p.o. until repeat swallow evaluation On IV Unasyn-5 days total.  AKI on CKD stage IIIb Suspect hemodynamically mediated in the setting of sepsis but did have recent angiography done so could be some element of contrast-induced nephropathy No hydronephrosis on CT abdomen Creatinine seems to have plateaued and is downtrending Continue supportive care Avoid nephrotoxic agents  Acute encephalopathy Apparently was weak/confused when she first presented-seems to have improved rapidly Suspect this could have been from AKI Supportive care  Normocytic anemia No overt evidence of blood loss No history of hematochezia-some intermittent dark stools but patient attributes this to iron therapy Hb levels fluctuating but relatively stable since yesterday Await FOBT Continue PPI twice daily Follow and transfuse accordingly.    Elevated troponins Demand ischemia She has no chest pain With stable EF Given AKI/advanced age-do not think she is a good candidate for any further intervention appropriate to manage this medically.  Paroxysmal atrial fibrillation Noted on telemetry monitor Rate controlled  without the use of any rate control agents Given severity of anemia/advanced age-already on DAPT-do not think she is a good long-term anticoagulation candidate.  Addendum: d/w daughter/grand-daughter-both acknowledge prior hx of GI bleed-both  understand embolic risk-risk of GI bleed (already on DAPT) if started on anticoagulation-both agreeable to not start long term anticoagulation  HTN BP stable Continue amlodipine  History of PAD-s/p numerous toe amputations-s/p most recent PCI on 3/1 with angioplasty plus stenting Continue DAPT Continue high intensity statin Appreciate vascular surgery input   Dysphagia Liquid diet at home-now n.p.o.-given aspiration episode on 3/7 On nocturnal feedings-will ask dietitian to change it to 24/7  SLP/nutrition eval  Hypothyroidism Continue Synthroid  Peripheral neuropathy Cymbalta/Neurontin  Asthma/COPD overlap Not in exacerbation Continue bronchodilators  Debility/deconditioning PT/OT  Palliative care Confirms DNR Suspect benefits from gentle medical treatment-clearly not a candidate for aggressive care at this point Palliative care consult  has been placed by admitting MD-will await further recommendations.  BMI: Estimated body mass index is 20.74 kg/m as calculated from the following:   Height as of this encounter: '5\' 6"'$  (1.676 m).   Weight as of this encounter: 58.3 kg.   Code status:   Code Status: DNR   DVT Prophylaxis:IV heparin   Family Communication: Daughter-[name]-updated over the phone on 3/8   Disposition Plan: Status is: Inpatient Remains inpatient appropriate because: Severity of illness   Planned Discharge Destination:Home   Diet: Diet Order             Diet NPO time specified  Diet effective now                     Antimicrobial agents: Anti-infectives (From admission, onward)    Start     Dose/Rate Route Frequency Ordered Stop   06/10/22 0600  vancomycin (VANCOREADY) IVPB 750 mg/150 mL        750 mg 150 mL/hr over 60 Minutes Intravenous Every 48 hours 06/08/22 0642     06/08/22 2200  ceFEPIme (MAXIPIME) 2 g in sodium chloride 0.9 % 100 mL IVPB  Status:  Discontinued        2 g 200 mL/hr over 30 Minutes  Intravenous Every 24 hours 06/08/22 0642 06/08/22 0920   06/08/22 2000  Ampicillin-Sulbactam (UNASYN) 3 g in sodium chloride 0.9 % 100 mL IVPB        3 g 200 mL/hr over 30 Minutes Intravenous Every 12 hours 06/08/22 1831     06/08/22 1000  metroNIDAZOLE (FLAGYL) IVPB 500 mg  Status:  Discontinued        500 mg 100 mL/hr over 60 Minutes Intravenous Every 12 hours 06/08/22 0642 06/08/22 1325   06/08/22 0030  metroNIDAZOLE (FLAGYL) IVPB 500 mg        500 mg 100 mL/hr over 60 Minutes Intravenous  Once 06/08/22 0020 06/08/22 0217   06/08/22 0030  vancomycin (VANCOCIN) IVPB 1000 mg/200 mL premix        1,000 mg 200 mL/hr over 60 Minutes Intravenous  Once 06/08/22 0020 06/08/22 0317   06/08/22 0030  ceFEPIme (MAXIPIME) 2 g in sodium chloride 0.9 % 100 mL IVPB        2 g 200 mL/hr over 30 Minutes Intravenous  Once 06/08/22 0022 06/08/22 0140        MEDICATIONS: Scheduled Meds:  amLODipine  5 mg Oral Daily   artificial tears   Both Eyes QHS   aspirin  81 mg Per Tube Daily   atorvastatin  40 mg  Per Tube Daily   clopidogrel  75 mg Per Tube Daily   cycloSPORINE  1 drop Both Eyes BID   feeding supplement (OSMOLITE 1.5 CAL)  660 mL Per J Tube Q24H   ferrous sulfate  300 mg Per Tube Daily   fluticasone  2 spray Each Nare Daily   free water  180 mL Per Tube TID   gabapentin  300 mg Per Tube QHS   latanoprost  1 drop Both Eyes QHS   levothyroxine  100 mcg Per Tube Q0600   loratadine  10 mg Per Tube QPM   mometasone-formoterol  2 puff Inhalation BID   multivitamin with minerals  1 tablet Per Tube Daily   pantoprazole (PROTONIX) IV  40 mg Intravenous Q12H   polyethylene glycol  17 g Per Tube Daily   polyvinyl alcohol  1 drop Both Eyes QID   Continuous Infusions:  ampicillin-sulbactam (UNASYN) IV 3 g (06/09/22 1033)   [START ON 06/10/2022] vancomycin     PRN Meds:.acetaminophen, HYDROmorphone (DILAUDID) injection, ipratropium-albuterol, oxyCODONE, polyethylene glycol   I have personally  reviewed following labs and imaging studies  LABORATORY DATA: CBC: Recent Labs  Lab 06/12/2022 2245 06/08/22 0630 06/08/22 1827 06/09/22 0842  WBC 12.5* 9.0 10.3 7.6  NEUTROABS 10.5*  --   --   --   HGB 8.3* 7.3* 8.4* 7.7*  HCT 25.3* 22.4* 25.0* 23.3*  MCV 101.6* 100.0 98.4 99.1  PLT 213 174 189 172     Basic Metabolic Panel: Recent Labs  Lab 06/20/2022 2245 06/08/22 0630 06/09/22 0842  NA 131* 132* 133*  K 5.2* 4.7 4.6  CL 100 100 101  CO2 22 24 20*  GLUCOSE 131* 120* 240*  BUN 75* 76* 72*  CREATININE 2.54* 2.58* 2.45*  CALCIUM 8.7* 8.6* 8.4*  MG  --  2.1  --   PHOS  --  4.0  --      GFR: Estimated Creatinine Clearance: 12.4 mL/min (A) (by C-G formula based on SCr of 2.45 mg/dL (H)).  Liver Function Tests: Recent Labs  Lab 06/23/2022 2245 06/08/22 0630  AST 28 27  ALT 18 22  ALKPHOS 75 72  BILITOT 0.7 0.7  PROT 7.3 7.1  ALBUMIN 2.8* 2.6*    No results for input(s): "LIPASE", "AMYLASE" in the last 168 hours. No results for input(s): "AMMONIA" in the last 168 hours.  Coagulation Profile: Recent Labs  Lab 06/06/2022 2245  INR 1.2     Cardiac Enzymes: No results for input(s): "CKTOTAL", "CKMB", "CKMBINDEX", "TROPONINI" in the last 168 hours.  BNP (last 3 results) No results for input(s): "PROBNP" in the last 8760 hours.  Lipid Profile: No results for input(s): "CHOL", "HDL", "LDLCALC", "TRIG", "CHOLHDL", "LDLDIRECT" in the last 72 hours.  Thyroid Function Tests: No results for input(s): "TSH", "T4TOTAL", "FREET4", "T3FREE", "THYROIDAB" in the last 72 hours.  Anemia Panel: No results for input(s): "VITAMINB12", "FOLATE", "FERRITIN", "TIBC", "IRON", "RETICCTPCT" in the last 72 hours.  Urine analysis:    Component Value Date/Time   COLORURINE YELLOW 08/29/2021 0239   APPEARANCEUR HAZY (A) 08/29/2021 0239   LABSPEC 1.010 08/29/2021 0239   PHURINE 7.0 08/29/2021 0239   GLUCOSEU NEGATIVE 08/29/2021 0239   HGBUR NEGATIVE 08/29/2021 0239    BILIRUBINUR NEGATIVE 08/29/2021 0239   BILIRUBINUR Neg 11/01/2017 1228   KETONESUR NEGATIVE 08/29/2021 0239   PROTEINUR NEGATIVE 08/29/2021 0239   UROBILINOGEN 0.2 11/01/2017 1228   UROBILINOGEN 0.2 01/27/2015 2308   NITRITE NEGATIVE 08/29/2021 0239   LEUKOCYTESUR NEGATIVE 08/29/2021  0239    Sepsis Labs: Lactic Acid, Venous    Component Value Date/Time   LATICACIDVEN 1.8 06/08/2022 0103    MICROBIOLOGY: Recent Results (from the past 240 hour(s))  Blood Culture (routine x 2)     Status: None (Preliminary result)   Collection Time: 06/24/2022 10:30 PM   Specimen: BLOOD  Result Value Ref Range Status   Specimen Description BLOOD RIGHT ANTECUBITAL  Final   Special Requests   Final    BOTTLES DRAWN AEROBIC ONLY Blood Culture results may not be optimal due to an inadequate volume of blood received in culture bottles   Culture   Final    NO GROWTH 2 DAYS Performed at [facility] [address] [place], [place] [address]    Report Status PENDING  Incomplete  Resp panel by RT-PCR (RSV, Flu A&B, Covid) Anterior Nasal Swab     Status: None   Collection Time: 07/02/2022 10:45 PM   Specimen: Anterior Nasal Swab  Result Value Ref Range Status   SARS Coronavirus 2 by RT PCR NEGATIVE NEGATIVE Final   Influenza A by PCR NEGATIVE NEGATIVE Final   Influenza B by PCR NEGATIVE NEGATIVE Final    Comment: (NOTE) The Xpert Xpress SARS-CoV-2/FLU/RSV plus assay is intended as an aid in the diagnosis of influenza from Nasopharyngeal swab specimens and should not be used as a sole basis for treatment. Nasal washings and aspirates are unacceptable for Xpert Xpress SARS-CoV-2/FLU/RSV testing.  Fact Sheet for Patients: EntrepreneurPulse.com.au  Fact Sheet for Healthcare Providers: IncredibleEmployment.be  This test is not yet approved or cleared by the [place] FDA and has been authorized for detection and/or diagnosis of SARS-CoV-2 by FDA under an  Emergency Use Authorization (EUA). This EUA will remain in effect (meaning this test can be used) for the duration of the COVID-19 declaration under Section 564(b)(1) of the Act, 21 U.S.C. section 360bbb-3(b)(1), unless the authorization is terminated or revoked.     Resp Syncytial Virus by PCR NEGATIVE NEGATIVE Final    Comment: (NOTE) Fact Sheet for Patients: EntrepreneurPulse.com.au  Fact Sheet for Healthcare Providers: IncredibleEmployment.be  This test is not yet approved or cleared by the [place] FDA and has been authorized for detection and/or diagnosis of SARS-CoV-2 by FDA under an Emergency Use Authorization (EUA). This EUA will remain in effect (meaning this test can be used) for the duration of the COVID-19 declaration under Section 564(b)(1) of the Act, 21 U.S.C. section 360bbb-3(b)(1), unless the authorization is terminated or revoked.  Performed at [facility], [place] [address] [place], [place] [address]   Blood Culture (routine x 2)     Status: None (Preliminary result)   Collection Time: 06/13/2022 10:45 PM   Specimen: BLOOD LEFT FOREARM  Result Value Ref Range Status   Specimen Description BLOOD LEFT FOREARM  Final   Special Requests   Final    BOTTLES DRAWN AEROBIC AND ANAEROBIC Blood Culture results may not be optimal due to an inadequate volume of blood received in culture bottles   Culture   Final    NO GROWTH 2 DAYS Performed at [facility], [place] [address] [place], [place] [address]    Report Status PENDING  Incomplete    RADIOLOGY STUDIES/RESULTS: DG Chest Port 1V same Day  Result Date: 06/09/2022 CLINICAL DATA:  Shortness of breath. EXAM: PORTABLE CHEST 1 VIEW COMPARISON:  June 08, 2022. FINDINGS: Stable cardiomegaly. Left basilar atelectasis and effusion may be present. Elevated right hemidiaphragm is  noted with minimal right basilar subsegmental atelectasis. Right upper lobe atelectasis is noted.  Bony thorax is unremarkable. IMPRESSION: Left basilar atelectasis and effusion may be present. Elevated right hemidiaphragm is noted with minimal right basilar subsegmental atelectasis. Right upper lobe atelectasis is noted. Aortic Atherosclerosis (ICD10-I70.0). Electronically Signed   By: Marijo Conception M.D.   On: 06/09/2022 08:24   DG Chest Port 1V same Day  Result Date: 06/08/2022 CLINICAL DATA:  Shortness of breath. EXAM: PORTABLE CHEST 1 VIEW COMPARISON:  Chest radiograph dated 07/01/2022. FINDINGS: Minimal left lung base atelectasis. Trace left pleural effusion may be present. No focal consolidation or pneumothorax. Mild cardiomegaly. Atherosclerotic calcification of the aorta. No acute osseous pathology. IMPRESSION: Minimal left lung base atelectasis and possible trace left pleural effusion. Electronically Signed   By: Anner Crete M.D.   On: 06/08/2022 18:18   VAS [place] LOWER EXTREMITY VENOUS (DVT)  Result Date: 06/08/2022  Lower Venous DVT Study Patient Name:  Shannon [name]  Date of Exam:   06/08/2022 Medical Rec #: [id number]      Accession #:    [id number] Date of Birth: Mar 22, 1924      Patient Gender: F Patient Age:   55 years Exam Location:  [facility] Procedure:      VAS [place] LOWER EXTREMITY VENOUS (DVT) Referring Phys: Archie Patten HALL --------------------------------------------------------------------------------  Indications: Swelling.  Risk Factors: Left SFA angioplasty and stenting, left tibioperoneal trunk angioplasty, and left peroneal artery angioplasty done 06/02/2022 Comparison Study: Prior negative right LEV done 02/14/21, prior negative left                   LEV done 08/27/20 Performing Technologist: Sharion Dove RVS  Examination Guidelines: A complete evaluation includes B-mode imaging, spectral Doppler, color Doppler, and power Doppler as needed of all accessible portions of each vessel. Bilateral testing is considered an integral part of a complete examination. Limited  examinations for reoccurring indications may be performed as noted. The reflux portion of the exam is performed with the patient in reverse Trendelenburg.  +--------+---------------+---------+-----------+----------------+-------------+ RIGHT   CompressibilityPhasicitySpontaneityProperties      Thrombus                                                                 Aging         +--------+---------------+---------+-----------+----------------+-------------+ CFV     Full                               pulsatile                                                                waveforms                     +--------+---------------+---------+-----------+----------------+-------------+ SFJ     Full                                                             +--------+---------------+---------+-----------+----------------+-------------+  FV Prox Full                                                             +--------+---------------+---------+-----------+----------------+-------------+ FV Mid  Full                               pulsatile                                                                waveforms                     +--------+---------------+---------+-----------+----------------+-------------+ FV      Full                                                             Distal                                                                   +--------+---------------+---------+-----------+----------------+-------------+ PFV     Full                                                             +--------+---------------+---------+-----------+----------------+-------------+ POP     Full                               pulsatile                                                                waveforms                     +--------+---------------+---------+-----------+----------------+-------------+ PTV     Full                                                              +--------+---------------+---------+-----------+----------------+-------------+ PERO    Full                                                             +--------+---------------+---------+-----------+----------------+-------------+   +--------+---------------+---------+-----------+----------------+-------------+  LEFT    CompressibilityPhasicitySpontaneityProperties      Thrombus                                                                 Aging         +--------+---------------+---------+-----------+----------------+-------------+ CFV     Full                               pulsatile                                                                waveforms                     +--------+---------------+---------+-----------+----------------+-------------+ SFJ     Full                                                             +--------+---------------+---------+-----------+----------------+-------------+ FV Prox Full                                                             +--------+---------------+---------+-----------+----------------+-------------+ FV Mid  Full                               pulsatile                                                                waveforms                     +--------+---------------+---------+-----------+----------------+-------------+ FV      Full                                                             Distal                                                                   +--------+---------------+---------+-----------+----------------+-------------+ PFV     Full                                                             +--------+---------------+---------+-----------+----------------+-------------+  POP     Full                               pulsatile                                                                waveforms                      +--------+---------------+---------+-----------+----------------+-------------+ PTV     Full                                                             +--------+---------------+---------+-----------+----------------+-------------+ PERO    Full                                                             +--------+---------------+---------+-----------+----------------+-------------+     Summary: BILATERAL: - No evidence of deep vein thrombosis seen in the lower extremities, bilaterally. -No evidence of popliteal cyst, bilaterally. RIGHT: - Ultrasound characteristics of enlarged lymph nodes are noted in the groin. pulsatile waveforms noted  LEFT: - Ultrasound characteristics of enlarged lymph nodes noted in the groin. pulsatile waveforms noted.  *See table(s) above for measurements and observations. Electronically signed by Orlie Pollen on 06/08/2022 at 4:22:40 PM.    Final    ECHOCARDIOGRAM COMPLETE  Result Date: 06/08/2022    ECHOCARDIOGRAM REPORT   Patient Name:   Shannon [name] Date of Exam: 06/08/2022 Medical Rec #:  [id number]     Height:       66.0 in Accession #:    [id number]    Weight:       130.0 lb Date of Birth:  1923/07/15     BSA:          1.665 m Patient Age:    51 years      BP:           123/64 mmHg Patient Gender: F             HR:           64 bpm. Exam Location:  Inpatient Procedure: 2D Echo, Cardiac Doppler and Color Doppler Indications:    elevated troponins  History:        Patient has prior history of Echocardiogram examinations, most                 recent 05/19/2019. CHF, Signs/Symptoms:Sepsis; Risk                 Factors:Diabetes, Hypertension and Dyslipidemia.  Sonographer:    Melissa Morford RDCS (AE, PE) Referring Phys: [id number] [place]  1. Left ventricular ejection fraction, by estimation, is 60 to 65%. The left ventricle has normal function. The left ventricle has no regional wall motion abnormalities. Left ventricular diastolic  parameters are consistent with  Grade II diastolic dysfunction (pseudonormalization). Elevated left atrial pressure.  2. Right ventricular systolic function is normal. The right ventricular size is normal.  3. Left atrial size was moderately dilated.  4. Right atrial size was moderately dilated.  5. The mitral valve is normal in structure. Mild mitral valve regurgitation.  6. Tricuspid valve regurgitation is moderate.  7. The aortic valve is tricuspid. There is mild calcification of the aortic valve. There is mild thickening of the aortic valve. Aortic valve regurgitation is mild. Aortic valve sclerosis/calcification is present, without any evidence of aortic stenosis. Comparison(s): Prior images reviewed side by side. The left ventricular diastolic function is significantly worse. FINDINGS  Left Ventricle: Left ventricular ejection fraction, by estimation, is 60 to 65%. The left ventricle has normal function. The left ventricle has no regional wall motion abnormalities. The left ventricular internal cavity size was normal in size. There is  no left ventricular hypertrophy. Left ventricular diastolic parameters are consistent with Grade II diastolic dysfunction (pseudonormalization). Elevated left atrial pressure. Right Ventricle: The right ventricular size is normal. No increase in right ventricular wall thickness. Right ventricular systolic function is normal. Left Atrium: Left atrial size was moderately dilated. Right Atrium: Right atrial size was moderately dilated. Pericardium: There is no evidence of pericardial effusion. Mitral Valve: The mitral valve is normal in structure. There is mild thickening of the anterior mitral valve leaflet(s). There is mild calcification of the anterior mitral valve leaflet(s). Mild mitral valve regurgitation. Tricuspid Valve: The tricuspid valve is normal in structure. Tricuspid valve regurgitation is moderate. Aortic Valve: The aortic valve is tricuspid. There is mild  calcification of the aortic valve. There is mild thickening of the aortic valve. There is moderate aortic valve annular calcification. Aortic valve regurgitation is mild. Aortic regurgitation PHT  measures 467 msec. Aortic valve sclerosis/calcification is present, without any evidence of aortic stenosis. Pulmonic Valve: The pulmonic valve was normal in structure. Pulmonic valve regurgitation is trivial. Aorta: The aortic root and ascending aorta are structurally normal, with no evidence of dilitation. IAS/Shunts: No atrial level shunt detected by color flow Doppler.  LEFT VENTRICLE PLAX 2D LVIDd:         4.20 cm   Diastology LVIDs:         2.70 cm   LV e' medial:    5.77 cm/s LV PW:         1.00 cm   LV E/e' medial:  19.4 LV IVS:        0.90 cm   LV e' lateral:   7.07 cm/s LVOT diam:     1.60 cm   LV E/e' lateral: 15.8 LV SV:         29 LV SV Index:   18 LVOT Area:     2.01 cm  RIGHT VENTRICLE RV S prime:     9.46 cm/s LEFT ATRIUM             Index        RIGHT ATRIUM           Index LA diam:        3.80 cm 2.28 cm/m   RA Area:     24.60 cm LA Vol (A2C):   53.4 ml 32.07 ml/m  RA Volume:   72.50 ml  43.54 ml/m LA Vol (A4C):   56.1 ml 33.69 ml/m LA Biplane Vol: 56.6 ml 33.99 ml/m  AORTIC VALVE LVOT Vmax:   67.00 cm/s LVOT Vmean:  45.800 cm/s LVOT VTI:  0.145 m AI PHT:      467 msec  AORTA Ao Root diam: 3.10 cm MITRAL VALVE                TRICUSPID VALVE MV Area (PHT): 4.08 cm     TR Peak grad:   37.2 mmHg MV Decel Time: 186 msec     TR Vmax:        305.00 cm/s MV E velocity: 112.00 cm/s MV A velocity: 85.20 cm/s   SHUNTS MV E/A ratio:  1.31         Systemic VTI:  0.14 m                             Systemic Diam: 1.60 cm Dani Gobble Croitoru MD Electronically signed by Sanda Klein MD Signature Date/Time: 06/08/2022/12:30:23 PM    Final    CT ABDOMEN PELVIS WO CONTRAST  Result Date: 06/08/2022 CLINICAL DATA:  Altered mental status, recent left leg stent, sacral wound EXAM: CT ABDOMEN AND PELVIS WITHOUT CONTRAST  TECHNIQUE: Multidetector CT imaging of the abdomen and pelvis was performed following the standard protocol without IV contrast. RADIATION DOSE REDUCTION: This exam was performed according to the departmental dose-optimization program which includes automated exposure control, adjustment of the mA and/or kV according to patient size and/or use of iterative reconstruction technique. COMPARISON:  08/28/2021 FINDINGS: Lower chest: Trace bilateral pleural effusions. Hepatobiliary: 10 mm cyst in the central right hepatic lobe (series 3/image 26), benign. Status post cholecystectomy. No intrahepatic or extrahepatic ductal dilatation. Pancreas: Within normal limits. Spleen: Within normal limits. Adrenals/Urinary Tract: Adrenal glands are within normal limits. Kidneys are within normal limits. No renal calculi or hydronephrosis. Bladder is within normal limits. 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 No evidence of bowel obstruction. Appendix is not discretely visualized. Prior left hemicolectomy with suture line in the lower pelvis (series 3/image 63). No colonic wall thickening or inflammatory changes. Vascular/Lymphatic: No evidence of abdominal aortic aneurysm. Atherosclerotic calcifications of the abdominal aorta and branch vessels. No suspicious abdominopelvic lymphadenopathy. Reproductive: The uterus is within normal limits. No adnexal masses. Other: Moderate right paramidline periumbilical hernia containing multiple nondilated loops of small bowel (series 3/image 44), without associated fluid or inflammatory changes. Small left paramidline periumbilical hernia containing fat (series 3/image 48), without fluid or associated inflammatory changes. Tiny fat containing right inguinal hernia (series 3/image 35). No abdominopelvic ascites. Musculoskeletal: Mild anterior wedging of the T11 vertebral body (sagittal image 93), chronic. Mild degenerative  changes of the visualized thoracolumbar spine. IMPRESSION: No acute findings in the abdomen/pelvis. Trace bilateral pleural effusions. Additional postsurgical and ancillary findings as above. Electronically Signed   By: Julian Hy M.D.   On: 06/08/2022 01:50   DG Abdomen 1 View  Result Date: 06/08/2022 CLINICAL DATA:  Abdominal distension EXAM: ABDOMEN - 1 VIEW COMPARISON:  07/06/2020 FINDINGS: Gastrojejunal catheter is noted in satisfactory position. Scattered large and small bowel gas is noted. No obstructive changes are seen. No free air is noted. Bony structures are within normal limits. IMPRESSION: No acute abnormality noted. Electronically Signed   By: Inez Catalina M.D.   On: 06/08/2022 00:10   CT Head Wo Contrast  Result Date: 06/08/2022 CLINICAL DATA:  Altered mental status EXAM: CT HEAD WITHOUT CONTRAST TECHNIQUE: Contiguous axial images were obtained from the base of the skull through the vertex without intravenous contrast. RADIATION DOSE REDUCTION: This exam was performed according  to the departmental dose-optimization program which includes automated exposure control, adjustment of the mA and/or kV according to patient size and/or use of iterative reconstruction technique. COMPARISON:  02/14/2021 FINDINGS: Brain: No evidence of acute infarction, hemorrhage, hydrocephalus, extra-axial collection or mass lesion/mass effect. Chronic atrophic and ischemic changes are noted. Vascular: No hyperdense vessel or unexpected calcification. Skull: Normal. Negative for fracture or focal lesion. Sinuses/Orbits: No acute finding. Other: None. IMPRESSION: Chronic atrophic and ischemic changes without acute abnormality. Electronically Signed   By: Inez Catalina M.D.   On: 06/08/2022 00:06   DG Chest Port 1 View  Result Date: 06/18/2022 CLINICAL DATA:  Possible sepsis EXAM: PORTABLE CHEST 1 VIEW COMPARISON:  08/29/2021 FINDINGS: Cardiac shadow is enlarged but stable. Aortic calcifications are noted. Lungs  are clear bilaterally. No bony abnormality is noted. IMPRESSION: No active disease. Electronically Signed   By: Inez Catalina M.D.   On: 06/06/2022 23:16     LOS: 1 day   Oren Binet, MD  [facility]    To contact the attending provider between 7A-7P or the covering provider during after hours 7P-7A, please log into the web site [email or web address] and access using universal [place] password for that web site. If you do not have the password, please call the hospital operator.  06/09/2022, 11:31 AM

## 2022-06-09 NOTE — Evaluation (Signed)
Physical Therapy Evaluation Patient Details Name: Shannon [name] MRN: [id number] DOB: 06-06-1923 Today's Date: 06/09/2022  History of Present Illness  Pt is a 89 year old woman who presented to ED on 3/6 with AMS, fever and generalized pain. + L LE cellulitis and sepsis. PMH: L SFA stent placed 3/1, R transtarsal amputation, PAD, HTN afib, g-tube 2* bowel obstructions, CADm CKD, COPD, diabetic polyneuropathy, anxiety, depression.  Clinical Impression  Pt presents today functioning below her mobility baseline with deficits in endurance, strength, and balance. Pt reports at baseline she utilizes her rollator and is able to perform all mobility without assistance and on room air. Pt limited during today's session by fatigue, requiring intermittent rest breaks, but overall tolerating the session well. Pt required modAx2 for bed mobility, with minAx2 and blocking of B feet to stand and minAx2 to transfer to the chair with use of a rollator. Pt will continue to benefit from skilled acute PT at this time to progress mobility and activity tolerance as pt fatigued with ~3 feet of ambulation. Pt reports she has full time care from her family at discharge, planning to return home, recommend HHPT at that time to continue to progress pt's mobility back to her baseline. Acute PT will follow as appropriate.        Recommendations for follow up therapy are one component of a multi-disciplinary discharge planning process, led by the attending physician.  Recommendations may be updated based on patient status, additional functional criteria and insurance authorization.  Follow Up Recommendations Home health PT      Assistance Recommended at Discharge Frequent or constant Supervision/Assistance  Patient can return home with the following  A lot of help with walking and/or transfers;Assistance with cooking/housework;Assist for transportation;Help with stairs or ramp for entrance    Equipment Recommendations None  recommended by PT  Recommendations for Other Services       Functional Status Assessment Patient has had a recent decline in their functional status and demonstrates the ability to make significant improvements in function in a reasonable and predictable amount of time.     Precautions / Restrictions Precautions Precautions: Fall Precaution Comments: chronic afib, g-tube Restrictions Weight Bearing Restrictions: No      Mobility  Bed Mobility Overal bed mobility: Needs Assistance Bed Mobility: Supine to Sit     Supine to sit: Mod assist, +2 for physical assistance, HOB elevated     General bed mobility comments: assist for trunk support and BLE management, increased time for rest breaks due to fatigue    Transfers Overall transfer level: Needs assistance Equipment used: Rollator (4 wheels) Transfers: Sit to/from Stand, Bed to chair/wheelchair/BSC Sit to Stand: Min assist, +2 physical assistance   Step pivot transfers: Min assist, +2 safety/equipment       General transfer comment: minAx2 to stand with blocking of B feet from sliding and power-up and steady. Asssit for balance and line management for transfer to chair    Ambulation/Gait Ambulation/Gait assistance: Min assist, +2 safety/equipment Gait Distance (Feet): 3 Feet Assistive device: Rollator (4 wheels) Gait Pattern/deviations: Step-to pattern, Decreased stride length, Shuffle, Trunk flexed, Drifts right/left Gait velocity: decreased     General Gait Details: increased trunk flexion and downward gaze with increased time due to fatigue, assist for balance and line mangement  Stairs            Wheelchair Mobility    Modified Rankin (Stroke Patients Only)       Balance Overall balance assessment: Needs assistance  Sitting-balance support: Bilateral upper extremity supported, Feet supported Sitting balance-Leahy Scale: Fair Sitting balance - Comments: stable with static sitting   Standing  balance support: During functional activity, Bilateral upper extremity supported, Reliant on assistive device for balance Standing balance-Leahy Scale: Poor Standing balance comment: assist for balance and reliant on rollator                             Pertinent Vitals/Pain Pain Assessment Pain Assessment: Faces Faces Pain Scale: No hurt    Home Living Family/patient expects to be discharged to:: Private residence Living Arrangements: Children Available Help at Discharge: Family;Available 24 hours/day Type of Home: House Home Access: Ramped entrance       Home Layout: One level Home Equipment: Rollator (4 wheels);Tub bench;BSC/3in1;Wheelchair - manual      Prior Function Prior Level of Function : Needs assist             Mobility Comments: rollator for mobility ADLs Comments: sits to shower weekly, independent in self care, assist for IADLs     Hand Dominance   Dominant Hand: Right    Extremity/Trunk Assessment   Upper Extremity Assessment Upper Extremity Assessment: Defer to OT evaluation    Lower Extremity Assessment Lower Extremity Assessment: Generalized weakness (limited active knee flexion to ~90 degrees bilaterally)    Cervical / Trunk Assessment Cervical / Trunk Assessment: Kyphotic  Communication   Communication: HOH  Cognition Arousal/Alertness: Awake/alert Behavior During Therapy: WFL for tasks assessed/performed Overall Cognitive Status: Within Functional Limits for tasks assessed                                 General Comments: pleasant throughout session        General Comments General comments (skin integrity, edema, etc.): SPO2 stable on 2L O2, HR elevated to 120s while seated in chair, 110s with mobility    Exercises     Assessment/Plan    PT Assessment Patient needs continued PT services  PT Problem List Decreased strength;Decreased activity tolerance;Decreased range of motion;Decreased  balance;Decreased mobility;Cardiopulmonary status limiting activity       PT Treatment Interventions DME instruction;Gait training;Functional mobility training;Therapeutic activities;Therapeutic exercise;Balance training;Neuromuscular re-education;Patient/family education    PT Goals (Current goals can be found in the Care Plan section)  Acute Rehab PT Goals Patient Stated Goal: go home PT Goal Formulation: With patient Time For Goal Achievement: 06/23/22 Potential to Achieve Goals: Good    Frequency Min 3X/week     Co-evaluation PT/OT/SLP Co-Evaluation/Treatment: Yes Reason for Co-Treatment: For patient/therapist safety;To address functional/ADL transfers PT goals addressed during session: Mobility/safety with mobility OT goals addressed during session: ADL's and self-care       AM-PAC PT "6 Clicks" Mobility  Outcome Measure Help needed turning from your back to your side while in a flat bed without using bedrails?: A Lot Help needed moving from lying on your back to sitting on the side of a flat bed without using bedrails?: A Lot Help needed moving to and from a bed to a chair (including a wheelchair)?: A Lot Help needed standing up from a chair using your arms (e.g., wheelchair or bedside chair)?: A Lot Help needed to walk in hospital room?: A Lot Help needed climbing 3-5 steps with a railing? : Total 6 Click Score: 11    End of Session   Activity Tolerance: Patient limited by fatigue Patient left:  in chair;with call bell/phone within reach;with chair alarm set Nurse Communication: Mobility status PT Visit Diagnosis: Muscle weakness (generalized) (M62.81);Difficulty in walking, not elsewhere classified (R26.2)    Time: [id number] PT Time Calculation (min) (ACUTE ONLY): 32 min   Charges:   PT Evaluation $PT Eval Moderate Complexity: 1 Mod          Charlynne Cousins, PT DPT Acute Rehabilitation Services Office [phone]   Luvenia Heller 06/09/2022, 3:04 PM

## 2022-06-09 NOTE — Progress Notes (Signed)
Nutrition Brief Note    RD received a secure chat from MD requesting to change pt to continuous feeds after pt had an episode of aspiration yesterday. Pt is now strict NPO. Please see full assessment from 3/7.   Tube Feeds regimen as follows: - Transition to Osmolite 1.2 at 55 mL/hr (1320 mL per day) - Decrease Free water flush: 140 mL - TID - This regimen provides 1584 kcal, 73 gm protein, and 1502 mL free water daily.    Hermina Barters RD, LDN Clinical Dietitian See Shea Evans for contact information.

## 2022-06-09 NOTE — Progress Notes (Signed)
SLP Cancellation Note  Patient Details Name: Shannon [name] MRN: [id number] DOB: Jan 05, 1924   Cancelled treatment:       Reason Eval/Treat Not Completed: Patient not medically ready. Pt had a presumed aspiration event yesterday after significant O2 desaturation and NT suction with retrieval of blue tinged secretions. RN reports she administered pills and water prior to event, but none were blue and pt did not show any overt signs of aspiration while taking pills other than baseline shortness of breath. SLP ordered by Dr Sloan Leiter, but pt reported MD told her not to drink anything today and was fearful of attempting. She was agreeable to continue taking pills via her J tube. Dr Sloan Leiter confirmed he intends for swallow eval tomorrow and ok to keep her NPO today.    Vang Kraeger, Katherene Ponto 06/09/2022, 1:52 PM

## 2022-06-09 NOTE — Progress Notes (Signed)
Pt refused morning synthroid, states that she is able to swallow pills and did not want her pill through her tube. RN educated pt on her aspiration risk and NPO order, pt still refused to have pill administered through tube. Pt also converted to Afib this morning. Rounding provider made aware of all findings.

## 2022-06-10 DIAGNOSIS — A419 Sepsis, unspecified organism: Secondary | ICD-10-CM | POA: Diagnosis not present

## 2022-06-10 DIAGNOSIS — I480 Paroxysmal atrial fibrillation: Secondary | ICD-10-CM | POA: Diagnosis not present

## 2022-06-10 DIAGNOSIS — N179 Acute kidney failure, unspecified: Secondary | ICD-10-CM | POA: Diagnosis not present

## 2022-06-10 DIAGNOSIS — I10 Essential (primary) hypertension: Secondary | ICD-10-CM | POA: Diagnosis not present

## 2022-06-10 LAB — BASIC METABOLIC PANEL
Anion gap: 7 (ref 5–15)
BUN: 68 mg/dL — ABNORMAL HIGH (ref 8–23)
CO2: 24 mmol/L (ref 22–32)
Calcium: 8.4 mg/dL — ABNORMAL LOW (ref 8.9–10.3)
Chloride: 103 mmol/L (ref 98–111)
Creatinine, Ser: 2.35 mg/dL — ABNORMAL HIGH (ref 0.44–1.00)
GFR, Estimated: 18 mL/min — ABNORMAL LOW (ref >60)
Glucose, Bld: 202 mg/dL — ABNORMAL HIGH (ref 70–99)
Potassium: 4.6 mmol/L (ref 3.5–5.1)
Sodium: 134 mmol/L — ABNORMAL LOW (ref 135–145)

## 2022-06-10 LAB — CBC
HCT: 22.1 % — ABNORMAL LOW (ref 36.0–46.0)
Hemoglobin: 7.5 g/dL — ABNORMAL LOW (ref 12.0–15.0)
MCH: 33 pg (ref 26.0–34.0)
MCHC: 33.9 g/dL (ref 30.0–36.0)
MCV: 97.4 fL (ref 80.0–100.0)
Platelets: 171 10*3/uL (ref 150–400)
RBC: 2.27 MIL/uL — ABNORMAL LOW (ref 3.87–5.11)
RDW: 15.5 % (ref 11.5–15.5)
WBC: 6.2 10*3/uL (ref 4.0–10.5)
nRBC: 0 % (ref 0.0–0.2)

## 2022-06-10 LAB — GLUCOSE, CAPILLARY
Glucose-Capillary: 174 mg/dL — ABNORMAL HIGH (ref 70–99)
Glucose-Capillary: 187 mg/dL — ABNORMAL HIGH (ref 70–99)
Glucose-Capillary: 188 mg/dL — ABNORMAL HIGH (ref 70–99)
Glucose-Capillary: 222 mg/dL — ABNORMAL HIGH (ref 70–99)

## 2022-06-10 NOTE — Progress Notes (Signed)
PROGRESS NOTE        PATIENT DETAILS Name: Shannon [name] Age: 89 y.o. Sex: female Date of Birth: 1923-11-12 Admit Date: 06/09/2022 Admitting Physician [name], DO EF:[id number], [name], NP  Brief Summary: Patient is a 89 y.o.  female PAD-s/p recent angioplasty/PCI on 3/1, asthma/COPD overlap, DM-2, HTN, chronic HFpEF, CKD stage IIIb, dysphagia-s/p PEG tube placement-on nocturnal feedings-who presented with weakness/confusion, fever, LLE swelling/pain-she was found to have probable LLE cellulitis with AKI-and subsequently admitted to the hospitalist service.  Significant events: 3/6>> admit to [facility] 3/7>> episode of aspiration-requiring deep nasotracheal suctioning/oxygen.  Kept NPO. 3/8>> A-fib on telemetry monitor  Significant studies: 3/6>> CXR: No PNA 3/7>> CT abdomen/pelvis: No acute findings. 3/7>> echo: EF [id number], grade 2 diastolic dysfunction. 3/7>> bilateral lower extremity Doppler: No DVT. 3/8>> CXR: Right upper lobe atelectasis.  Significant microbiology data: 3/6>> COVID/influenza/RSV PCR: Negative 3/6>> blood culture: No growth  Procedures: None  Consults: VVS Palliative care  Subjective: No major issues overnight.  Titrated to room air.  Objective: Vitals: Blood pressure (!) 124/59, pulse 93, temperature 98.2 F (36.8 C), temperature source Oral, resp. rate (!) 23, height '5\' 6"'$  (1.676 m), weight 58.3 kg, SpO2 98 %.   Exam: Gen Exam:Alert awake-not in any distress.  Frail HEENT:atraumatic, normocephalic Chest: B/L clear to auscultation anteriorly CVS:S1S2 regular Abdomen:soft non tender, non distended Extremities: Minimal LLE erythema-hardly any edema today.  Improved from 3/7. Neurology: Non focal Skin: no rash  Pertinent Labs/Radiology:    Latest Ref Rng & Units 06/10/2022    3:35 AM 06/09/2022    8:42 AM 06/08/2022    6:27 PM  CBC  WBC 4.0 - 10.5 K/uL 6.2  7.6  10.3   Hemoglobin 12.0 - 15.0 g/dL 7.5  7.7  8.4    Hematocrit 36.0 - 46.0 % 22.1  23.3  25.0   Platelets 150 - 400 K/uL 171  172  189     Lab Results  Component Value Date   NA 134 (L) 06/10/2022   K 4.6 06/10/2022   CL 103 06/10/2022   CO2 24 06/10/2022      Assessment/Plan: Sepsis likely from LLE cellulitis Sepsis physiology improved Minimal erythema/edema today-better than 3/7 Dopplers negative for DVT  Will discontinue IV vancomycin-as cellulitis is minimal and nonpurulent.  Should be covered by IV Unasyn (for aspiration PNA)  Acute hypoxic respiratory failure due to aspiration pneumonia Had aspiration episode on 3/7-briefly required NRB-nasotracheal suctioning-was subsequently transition to 3-4 L of oxygen.  Improving-have transitioned her to room air today Appreciate SLP evaluation 3/9-okay to allow sips.  Per patient-she was on a liquid diet at home (gets nocturnal tube feeds).  She understands the risk of aspiration-and would want to eat liquids if offered for comfort/pleasure.   On IV Unasyn-5 days total-started 3/7  AKI on CKD stage IIIb Suspect hemodynamically mediated in the setting of sepsis but did have recent angiography done so could be some element of contrast-induced nephropathy No hydronephrosis on CT abdomen Creatinine now downtrending Continue supportive care Avoid nephrotoxic agents  Acute encephalopathy Apparently was weak/confused when she first presented-seems to have improved rapidly Suspect this could have been from AKI Supportive care  Normocytic anemia No overt evidence of blood loss-no BM for the past several days No history of hematochezia-some intermittent dark stools but patient attributes this to iron therapy Hb levels  fluctuating but relatively stable since yesterday Await FOBT Continue PPI twice daily Follow and transfuse accordingly.    Elevated troponins Demand ischemia She has no chest pain Echo with stable EF Given AKI/advanced age-do not think she is a good candidate for any  further intervention appropriate to manage this medically.  Paroxysmal atrial fibrillation Noted on telemetry monitor Rate controlled without the use of any rate control agents Given severity of anemia/advanced age-already on DAPT-do not think she is a good long-term anticoagulation candidate.  This was discussed with patient/daughter/granddaughter on 3/8-all understand the embolic risk and agree to keep her off anticoagulation at this point-she has had prior history of recurrent GI bleeding.  Reviewed prior charts-has had A-fib in the past-deemed not  anticoagulation candidate.  HTN BP stable Continue amlodipine  History of PAD-s/p numerous toe amputations-s/p most recent PCI on 3/1 with angioplasty plus stenting Continue DAPT Continue high intensity statin Appreciate vascular surgery input   Dysphagia Usually on nocturnal tube feeds + liquid diet at home Had aspiration episode 3/7-kept n.p.o.  SLP following Patient understands the risk of resuming oral intake-including life-threatening/life disabling risks-accepts risks-and would like to resume liquids for comfort/pleasure.  Note-tube feeds switched from nocturnal to continuous  Hypothyroidism Continue Synthroid  Peripheral neuropathy Continue Neurontin Cymbalta on hold as cannot be given through PEG tube  Asthma/COPD overlap Not in exacerbation Continue bronchodilators  Debility/deconditioning PT/OT  Palliative care Confirms DNR Suspect benefits from gentle medical treatment-clearly not a candidate for aggressive care at this point Palliative care consult  has been placed by admitting MD-will await further recommendations.  BMI: Estimated body mass index is 20.74 kg/m as calculated from the following:   Height as of this encounter: '5\' 6"'$  (1.676 m).   Weight as of this encounter: 58.3 kg.   Code status:   Code Status: DNR   DVT Prophylaxis:IV heparin   Family Communication: Daughter-[name]-[phone]-updated  over the phone on 3/9   Disposition Plan: Status is: Inpatient Remains inpatient appropriate because: Severity of illness   Planned Discharge Destination:Home   Diet: Diet Order             Diet NPO time specified Except for: Other (See Comments)  Diet effective now                     Antimicrobial agents: Anti-infectives (From admission, onward)    Start     Dose/Rate Route Frequency Ordered Stop   06/10/22 0600  vancomycin (VANCOREADY) IVPB 750 mg/150 mL        750 mg 150 mL/hr over 60 Minutes Intravenous Every 48 hours 06/08/22 0642     06/08/22 2200  ceFEPIme (MAXIPIME) 2 g in sodium chloride 0.9 % 100 mL IVPB  Status:  Discontinued        2 g 200 mL/hr over 30 Minutes Intravenous Every 24 hours 06/08/22 0642 06/08/22 0920   06/08/22 2000  Ampicillin-Sulbactam (UNASYN) 3 g in sodium chloride 0.9 % 100 mL IVPB        3 g 200 mL/hr over 30 Minutes Intravenous Every 12 hours 06/08/22 1831     06/08/22 1000  metroNIDAZOLE (FLAGYL) IVPB 500 mg  Status:  Discontinued        500 mg 100 mL/hr over 60 Minutes Intravenous Every 12 hours 06/08/22 0642 06/08/22 1325   06/08/22 0030  metroNIDAZOLE (FLAGYL) IVPB 500 mg        500 mg 100 mL/hr over 60 Minutes Intravenous  Once 06/08/22 0020 06/08/22  0217   06/08/22 0030  vancomycin (VANCOCIN) IVPB 1000 mg/200 mL premix        1,000 mg 200 mL/hr over 60 Minutes Intravenous  Once 06/08/22 0020 06/08/22 0317   06/08/22 0030  ceFEPIme (MAXIPIME) 2 g in sodium chloride 0.9 % 100 mL IVPB        2 g 200 mL/hr over 30 Minutes Intravenous  Once 06/08/22 0022 06/08/22 0140        MEDICATIONS: Scheduled Meds:  amLODipine  5 mg Per Tube Daily   artificial tears   Both Eyes QHS   aspirin  81 mg Per Tube Daily   atorvastatin  40 mg Per Tube Daily   clopidogrel  75 mg Per Tube Daily   cycloSPORINE  1 drop Both Eyes BID   ferrous sulfate  300 mg Per Tube Daily   fluticasone  2 spray Each Nare Daily   free water  140 mL Per Tube  TID   gabapentin  300 mg Per Tube QHS   latanoprost  1 drop Both Eyes QHS   levothyroxine  100 mcg Per Tube Q0600   loratadine  10 mg Per Tube QPM   mometasone-formoterol  2 puff Inhalation BID   multivitamin with minerals  1 tablet Per Tube Daily   pantoprazole (PROTONIX) IV  40 mg Intravenous Q12H   polyethylene glycol  17 g Per Tube Daily   polyvinyl alcohol  1 drop Both Eyes QID   Continuous Infusions:  ampicillin-sulbactam (UNASYN) IV 3 g (06/10/22 0934)   feeding supplement (OSMOLITE 1.2 CAL) 1,000 mL (06/09/22 1736)   vancomycin 750 mg (06/10/22 0817)   PRN Meds:.acetaminophen, HYDROmorphone (DILAUDID) injection, ipratropium-albuterol, oxyCODONE, polyethylene glycol   I have personally reviewed following labs and imaging studies  LABORATORY DATA: CBC: Recent Labs  Lab 06/06/2022 2245 06/08/22 0630 06/08/22 1827 06/09/22 0842 06/10/22 0335  WBC 12.5* 9.0 10.3 7.6 6.2  NEUTROABS 10.5*  --   --   --   --   HGB 8.3* 7.3* 8.4* 7.7* 7.5*  HCT 25.3* 22.4* 25.0* 23.3* 22.1*  MCV 101.6* 100.0 98.4 99.1 97.4  PLT 213 174 189 172 171     Basic Metabolic Panel: Recent Labs  Lab 06/05/2022 2245 06/08/22 0630 06/09/22 0842 06/10/22 0335  NA 131* 132* 133* 134*  K 5.2* 4.7 4.6 4.6  CL 100 100 101 103  CO2 22 24 20* 24  GLUCOSE 131* 120* 240* 202*  BUN 75* 76* 72* 68*  CREATININE 2.54* 2.58* 2.45* 2.35*  CALCIUM 8.7* 8.6* 8.4* 8.4*  MG  --  2.1  --   --   PHOS  --  4.0  --   --      GFR: Estimated Creatinine Clearance: 12.9 mL/min (A) (by C-G formula based on SCr of 2.35 mg/dL (H)).  Liver Function Tests: Recent Labs  Lab 06/19/2022 2245 06/08/22 0630  AST 28 27  ALT 18 22  ALKPHOS 75 72  BILITOT 0.7 0.7  PROT 7.3 7.1  ALBUMIN 2.8* 2.6*    No results for input(s): "LIPASE", "AMYLASE" in the last 168 hours. No results for input(s): "AMMONIA" in the last 168 hours.  Coagulation Profile: Recent Labs  Lab 06/19/2022 2245  INR 1.2     Cardiac  Enzymes: No results for input(s): "CKTOTAL", "CKMB", "CKMBINDEX", "TROPONINI" in the last 168 hours.  BNP (last 3 results) No results for input(s): "PROBNP" in the last 8760 hours.  Lipid Profile: No results for input(s): "CHOL", "HDL", "LDLCALC", "TRIG", "  CHOLHDL", "LDLDIRECT" in the last 72 hours.  Thyroid Function Tests: No results for input(s): "TSH", "T4TOTAL", "FREET4", "T3FREE", "THYROIDAB" in the last 72 hours.  Anemia Panel: No results for input(s): "VITAMINB12", "FOLATE", "FERRITIN", "TIBC", "IRON", "RETICCTPCT" in the last 72 hours.  Urine analysis:    Component Value Date/Time   COLORURINE YELLOW 08/29/2021 0239   APPEARANCEUR HAZY (A) 08/29/2021 0239   LABSPEC 1.010 08/29/2021 0239   PHURINE 7.0 08/29/2021 0239   GLUCOSEU NEGATIVE 08/29/2021 0239   HGBUR NEGATIVE 08/29/2021 0239   BILIRUBINUR NEGATIVE 08/29/2021 0239   BILIRUBINUR Neg 11/01/2017 1228   KETONESUR NEGATIVE 08/29/2021 0239   PROTEINUR NEGATIVE 08/29/2021 0239   UROBILINOGEN 0.2 11/01/2017 1228   UROBILINOGEN 0.2 01/27/2015 2308   NITRITE NEGATIVE 08/29/2021 0239   LEUKOCYTESUR NEGATIVE 08/29/2021 0239    Sepsis Labs: Lactic Acid, Venous    Component Value Date/Time   LATICACIDVEN 1.8 06/08/2022 0103    MICROBIOLOGY: Recent Results (from the past 240 hour(s))  Blood Culture (routine x 2)     Status: None (Preliminary result)   Collection Time: 06/30/2022 10:30 PM   Specimen: BLOOD  Result Value Ref Range Status   Specimen Description BLOOD RIGHT ANTECUBITAL  Final   Special Requests   Final    BOTTLES DRAWN AEROBIC ONLY Blood Culture results may not be optimal due to an inadequate volume of blood received in culture bottles   Culture   Final    NO GROWTH 3 DAYS Performed at [facility] [address] [place], [place] [address]    Report Status PENDING  Incomplete  Resp panel by RT-PCR (RSV, Flu A&B, Covid) Anterior Nasal Swab     Status: None   Collection Time: 06/08/2022 10:45 PM    Specimen: Anterior Nasal Swab  Result Value Ref Range Status   SARS Coronavirus 2 by RT PCR NEGATIVE NEGATIVE Final   Influenza A by PCR NEGATIVE NEGATIVE Final   Influenza B by PCR NEGATIVE NEGATIVE Final    Comment: (NOTE) The Xpert Xpress SARS-CoV-2/FLU/RSV plus assay is intended as an aid in the diagnosis of influenza from Nasopharyngeal swab specimens and should not be used as a sole basis for treatment. Nasal washings and aspirates are unacceptable for Xpert Xpress SARS-CoV-2/FLU/RSV testing.  Fact Sheet for Patients: EntrepreneurPulse.com.au  Fact Sheet for Healthcare Providers: IncredibleEmployment.be  This test is not yet approved or cleared by the [place] FDA and has been authorized for detection and/or diagnosis of SARS-CoV-2 by FDA under an Emergency Use Authorization (EUA). This EUA will remain in effect (meaning this test can be used) for the duration of the COVID-19 declaration under Section 564(b)(1) of the Act, 21 U.S.C. section 360bbb-3(b)(1), unless the authorization is terminated or revoked.     Resp Syncytial Virus by PCR NEGATIVE NEGATIVE Final    Comment: (NOTE) Fact Sheet for Patients: EntrepreneurPulse.com.au  Fact Sheet for Healthcare Providers: IncredibleEmployment.be  This test is not yet approved or cleared by the [place] FDA and has been authorized for detection and/or diagnosis of SARS-CoV-2 by FDA under an Emergency Use Authorization (EUA). This EUA will remain in effect (meaning this test can be used) for the duration of the COVID-19 declaration under Section 564(b)(1) of the Act, 21 U.S.C. section 360bbb-3(b)(1), unless the authorization is terminated or revoked.  Performed at [facility], [place] [address] [place], [place] [address]   Blood Culture (routine x 2)     Status: None (Preliminary result)   Collection Time: 06/06/2022  10:45 PM    Specimen: BLOOD LEFT FOREARM  Result Value Ref Range Status   Specimen Description BLOOD LEFT FOREARM  Final   Special Requests   Final    BOTTLES DRAWN AEROBIC AND ANAEROBIC Blood Culture results may not be optimal due to an inadequate volume of blood received in culture bottles   Culture   Final    NO GROWTH 3 DAYS Performed at [facility], [place] [address], [place], [place] [address]    Report Status PENDING  Incomplete    RADIOLOGY STUDIES/RESULTS: DG Chest Port 1V same Day  Result Date: 06/09/2022 CLINICAL DATA:  Shortness of breath. EXAM: PORTABLE CHEST 1 VIEW COMPARISON:  June 08, 2022. FINDINGS: Stable cardiomegaly. Left basilar atelectasis and effusion may be present. Elevated right hemidiaphragm is noted with minimal right basilar subsegmental atelectasis. Right upper lobe atelectasis is noted. Bony thorax is unremarkable. IMPRESSION: Left basilar atelectasis and effusion may be present. Elevated right hemidiaphragm is noted with minimal right basilar subsegmental atelectasis. Right upper lobe atelectasis is noted. Aortic Atherosclerosis (ICD10-I70.0). Electronically Signed   By: Marijo Conception M.D.   On: 06/09/2022 08:24   DG Chest Port 1V same Day  Result Date: 06/08/2022 CLINICAL DATA:  Shortness of breath. EXAM: PORTABLE CHEST 1 VIEW COMPARISON:  Chest radiograph dated 06/23/2022. FINDINGS: Minimal left lung base atelectasis. Trace left pleural effusion may be present. No focal consolidation or pneumothorax. Mild cardiomegaly. Atherosclerotic calcification of the aorta. No acute osseous pathology. IMPRESSION: Minimal left lung base atelectasis and possible trace left pleural effusion. Electronically Signed   By: Anner Crete M.D.   On: 06/08/2022 18:18     LOS: 2 days   Oren Binet, MD  [facility]    To contact the attending provider between 7A-7P or the covering provider during after hours 7P-7A, please log into the web site [email or web address] and access  using universal [place] password for that web site. If you do not have the password, please call the hospital operator.  06/10/2022, 10:34 AM

## 2022-06-10 NOTE — Evaluation (Signed)
Clinical/Bedside Swallow Evaluation Patient Details  Name: DEZIRAE DENTLER MRN: [id number] Date of Birth: Oct 13, 1923  Today's Date: 06/10/2022 Time: SLP Start Time (ACUTE ONLY): 0945 SLP Stop Time (ACUTE ONLY): 1010 SLP Time Calculation (min) (ACUTE ONLY): 25 min  Past Medical History:  Past Medical History:  Diagnosis Date   Acute respiratory failure ([place])    Anemia    previous blood transfusions   Arthritis    "all over"   Asthma    Atrial flutter (HCC)    Bradycardia    requiring previous d/c of BB and reduction of amiodarone   CAD (coronary artery disease)    nonobstructive per notes   Chronic diastolic CHF (congestive heart failure) ([place])    CKD (chronic kidney disease), stage III ([place])    Complication of blood transfusion    "got the wrong blood type at [facility] in ~ 2015; no adverse reaction that we are aware of"/daughter, [name] (01/27/2016)   COPD (chronic obstructive pulmonary disease) ([place])    Depression    "light case"   DVT (deep venous thrombosis) ([place]) 01/2016   a. LLE DVT 01/2016 - switched from Eliquis to Coumadin.   Dyspnea    with some activity   Gastric stenosis    a. s/p stomach tube   GERD (gastroesophageal reflux disease)    GI bleed    Headache    History of 2019 novel coronavirus disease (COVID-19)    History of blood transfusion    "several" (01/27/2016)   History of stomach ulcers    Hyperlipidemia    Hypertension    Hypothyroidism    On home oxygen therapy    "2L; 9PM til 9AM" (06/27/2017)   PAD (peripheral artery disease) (HCC)    a. prior gangrene, toe amputations, intervention.   PAF (paroxysmal atrial fibrillation) ([place])    Paraesophageal hernia    Perforated gastric ulcer ([place])    Pneumonia    "a few times" (06/27/2017)   Seasonal allergies    SIADH (syndrome of inappropriate ADH production) ([place])    [name] 01/10/2015   Small bowel obstruction ([place])    "I don't know how many" (01/11/2015)   Stroke [facility])    several- no residual    Type II diabetes mellitus ([place])    "related to prednisone use  for > 20 yr; once predinose stopped; no more DM RX" (01/27/2016)   UTI (urinary tract infection) 02/08/2016   Ventral hernia with bowel obstruction    Past Surgical History:  Past Surgical History:  Procedure Laterality Date   ABDOMINAL AORTOGRAM N/A 09/21/2016   Procedure: Abdominal Aortogram;  Surgeon: Waynetta Sandy, MD;  Location: [facility];  Service: Cardiovascular;  Laterality: N/A;   ABDOMINAL AORTOGRAM W/LOWER EXTREMITY N/A 10/20/2020   Procedure: ABDOMINAL AORTOGRAM W/LOWER EXTREMITY;  Surgeon: Cherre Robins, MD;  Location: [facility];  Service: Cardiovascular;  Laterality: N/A;   ABDOMINAL AORTOGRAM W/LOWER EXTREMITY N/A 06/02/2022   Procedure: ABDOMINAL AORTOGRAM W/LOWER EXTREMITY;  Surgeon: Cherre Robins, MD;  Location: [facility];  Service: Cardiovascular;  Laterality: N/A;   AMPUTATION Left 09/25/2016   Procedure: AMPUTATION DIGIT- LEFT 2ND AND 3RD TOES;  Surgeon: Rosetta Posner, MD;  Location: [facility];  Service: Vascular;  Laterality: Left;   AMPUTATION Right 05/03/2018   Procedure: AMPUTATION RIGHT GREAT TOE;  Surgeon: Rosetta Posner, MD;  [facility]: [facility];  Service: Vascular;  Laterality: Right;   CATARACT EXTRACTION W/ INTRAOCULAR LENS  IMPLANT, BILATERAL  CHOLECYSTECTOMY OPEN     COLECTOMY     ESOPHAGOGASTRODUODENOSCOPY N/A 01/19/2014   Procedure: ESOPHAGOGASTRODUODENOSCOPY (EGD);  Surgeon: Irene Shipper, MD;  [facility];  Service: Endoscopy;  Laterality: N/A;   ESOPHAGOGASTRODUODENOSCOPY N/A 01/20/2014   Procedure: ESOPHAGOGASTRODUODENOSCOPY (EGD);  Surgeon: Irene Shipper, MD;  Location: [facility];  Service: Endoscopy;  Laterality: N/A;   ESOPHAGOGASTRODUODENOSCOPY N/A 03/19/2014   Procedure: ESOPHAGOGASTRODUODENOSCOPY (EGD);  Surgeon: Milus Banister, MD;  Location: [facility];  Service: Endoscopy;  Laterality: N/A;   ESOPHAGOGASTRODUODENOSCOPY N/A  07/08/2015   Procedure: ESOPHAGOGASTRODUODENOSCOPY (EGD);  Surgeon: Doran Stabler, MD;  Location: [facility];  Service: Endoscopy;  Laterality: N/A;   ESOPHAGOGASTRODUODENOSCOPY (EGD) WITH PROPOFOL N/A 09/15/2015   Procedure: ESOPHAGOGASTRODUODENOSCOPY (EGD) WITH PROPOFOL;  Surgeon: Manus Gunning, MD;  [facility]: [facility];  Service: Gastroenterology;  Laterality: N/A;   ESOPHAGOGASTRODUODENOSCOPY (EGD) WITH PROPOFOL N/A 10/12/2020   Procedure: ESOPHAGOGASTRODUODENOSCOPY (EGD) WITH PROPOFOL;  Surgeon: Gatha Mayer, MD;  Location: [facility];  Service: Endoscopy;  Laterality: N/A;   FOOT SURGERY     GASTROJEJUNOSTOMY     hx/notes 01/10/2015   GASTROJEJUNOSTOMY N/A 09/23/2015   Procedure: OPEN GASTROJEJUNOSTOMY TUBE PLACEMENT;  Surgeon: Arta Bruce Kinsinger, MD;  [facility]: [facility];  Service: General;  Laterality: N/A;   GLAUCOMA SURGERY Bilateral    HEMATOMA EVACUATION Right 10/20/2020   Procedure: EVACUATION HEMATOMA RIGHT GROIN;  Surgeon: Cherre Robins, MD;  Location: [facility];  Service: Vascular;  Laterality: Right;   IR CM INJ ANY COLONIC TUBE W/FLUORO  01/05/2017   IR CM INJ ANY COLONIC TUBE W/FLUORO  06/07/2017   IR CM INJ ANY COLONIC TUBE W/FLUORO  11/05/2017   IR CM INJ ANY COLONIC TUBE W/FLUORO  09/18/2018   IR CM INJ ANY COLONIC TUBE W/FLUORO  03/06/2019   IR CM INJ ANY COLONIC TUBE W/FLUORO  03/11/2019   IR GASTR TUBE CONVERT GASTR-JEJ PER W/FL MOD SED  04/30/2020   IR GENERIC HISTORICAL  01/07/2016   IR [place] TUBE CHANGE 01/07/2016 Jacqulynn Cadet, MD [facility]   IR GENERIC HISTORICAL  01/27/2016   IR MECH REMOV OBSTRUC MAT ANY COLON TUBE W/FLUORO 01/27/2016 Markus Daft, MD [facility]   IR GENERIC HISTORICAL  02/07/2016   IR PATIENT EVAL TECH 0-60 MINS Darrell K Allred, PA-C [facility]   IR GENERIC HISTORICAL  02/08/2016   IR [place] TUBE CHANGE 02/08/2016 Greggory Keen, MD [facility]   IR GENERIC HISTORICAL  01/06/2016   IR [place] TUBE CHANGE 01/06/2016  [facility] OUT REF   IR GENERIC HISTORICAL  05/02/2016   IR CM INJ ANY COLONIC TUBE W/FLUORO 05/02/2016 Arne Cleveland, MD [facility]   IR GENERIC HISTORICAL  05/15/2016   IR [place] TUBE CHANGE 05/15/2016 Sandi Mariscal, MD [facility]   IR GENERIC HISTORICAL  06/28/2016   IR [place] TUBE CHANGE 06/28/2016 [facility]   IR [place] TUBE CHANGE  02/20/2017   IR [place] TUBE CHANGE  05/10/2017   IR [place] TUBE CHANGE  07/06/2017   IR [place] TUBE CHANGE  08/02/2017   IR [place] TUBE CHANGE  10/15/2017   IR [place] TUBE CHANGE  12/26/2017   IR [place] TUBE CHANGE  04/08/2018   IR [place] TUBE CHANGE  06/19/2018   IR [place] TUBE CHANGE  03/03/2019   IR [place] TUBE CHANGE  06/06/2019   IR [place] TUBE CHANGE  09/26/2019   IR [place] TUBE CHANGE  01/09/2020   IR [place] TUBE CHANGE  05/12/2020   IR [place]  TUBE CHANGE  10/05/2020   IR GJ TUBE CHANGE  10/14/2020   IR GJ TUBE CHANGE  10/19/2020   IR GJ TUBE CHANGE  02/25/2021   IR GJ TUBE CHANGE  03/23/2021   IR GJ TUBE CHANGE  08/29/2021   IR GJ TUBE CHANGE  08/30/2021   IR GJ TUBE CHANGE  09/05/2021   IR GJ TUBE CHANGE  01/26/2022   IR PATIENT EVAL TECH 0-60 MINS  10/19/2016   IR PATIENT EVAL TECH 0-60 MINS  12/25/2016   IR PATIENT EVAL TECH 0-60 MINS  01/29/2017   IR PATIENT EVAL TECH 0-60 MINS  04/04/2017   IR PATIENT EVAL TECH 0-60 MINS  04/30/2017   IR PATIENT EVAL TECH 0-60 MINS  07/31/2017   IR REPLC DUODEN/JEJUNO TUBE PERCUT W/FLUORO  11/14/2016   IR REPLC GASTRO/COLONIC TUBE PERCUT W/FLUORO  05/31/2021   IRRIGATION AND DEBRIDEMENT FOOT Bilateral 12/26/2020   Procedure: IRRIGATION AND DEBRIDEMENT FOOT AND BONE BIOPSY;  Surgeon: Trula Slade, DPM;  Location: [facility];  Service: Podiatry;  Laterality: Bilateral;   LAPAROTOMY N/A 01/20/2015   Procedure: EXPLORATORY LAPAROTOMY;  Surgeon: Coralie Keens, MD;  Location: [facility];  Service: General;  Laterality: N/A;   LOWER EXTREMITY ANGIOGRAPHY Left 09/21/2016   Procedure: Lower Extremity Angiography;  Surgeon: Waynetta Sandy, MD;  Location: [facility];  Service: Cardiovascular;  Laterality: Left;   LOWER EXTREMITY ANGIOGRAPHY Right 06/27/2017   Procedure: Lower Extremity Angiography;  Surgeon: Waynetta Sandy, MD;  Location: [place] [facility];  Service: Cardiovascular;  Laterality: Right;   LOWER EXTREMITY ANGIOGRAPHY Right 12/24/2020   Procedure: LOWER EXTREMITY ANGIOGRAPHY;  Surgeon: Cherre Robins, MD;  Location: [facility];  Service: Cardiovascular;  Laterality: Right;   LYSIS OF ADHESION N/A 01/20/2015   Procedure: LYSIS OF ADHESIONS < 1 HOUR;  Surgeon: Coralie Keens, MD;  Location: [facility];  Service: General;  Laterality: N/A;   PERIPHERAL VASCULAR BALLOON ANGIOPLASTY Left 09/21/2016   Procedure: Peripheral Vascular Balloon Angioplasty;  Surgeon: Waynetta Sandy, MD;  Location: [facility];  Service: Cardiovascular;  Laterality: Left;  drug coated balloon   PERIPHERAL VASCULAR BALLOON ANGIOPLASTY Right 06/27/2017   Procedure: PERIPHERAL VASCULAR BALLOON ANGIOPLASTY;  Surgeon: Waynetta Sandy, MD;  Location: [facility];  Service: Cardiovascular;  Laterality: Right;  SFA/[id number]   PERIPHERAL VASCULAR BALLOON ANGIOPLASTY  06/02/2022   Procedure: PERIPHERAL VASCULAR BALLOON ANGIOPLASTY;  Surgeon: Cherre Robins, MD;  Location: [facility];  Service: Cardiovascular;;  Tibial   PERIPHERAL VASCULAR INTERVENTION Left 10/20/2020   Procedure: PERIPHERAL VASCULAR INTERVENTION;  Surgeon: Cherre Robins, MD;  Location: [facility];  Service: Cardiovascular;  Laterality: Left;   PERIPHERAL VASCULAR INTERVENTION  06/02/2022   Procedure: PERIPHERAL VASCULAR INTERVENTION;  Surgeon: Cherre Robins, MD;  Location: [facility];  Service: Cardiovascular;;  SFA   TONSILLECTOMY     TRANSMETATARSAL AMPUTATION Right 11/14/2019   Procedure: TRANSMETATARSAL AMPUTATION RIGHT FOOT;  Surgeon: Edrick Kins, DPM;  Location: [facility];  Service: Podiatry;  Laterality: Right;    TUBAL LIGATION     VENTRAL HERNIA REPAIR  2015   incarcerated ventral hernia ([facility] 09/2013)/notes 01/10/2015   WOUND EXPLORATION Right 10/20/2020   Procedure: PRIMARY CLOSURE COMMON FEMORAL ARTERY;  Surgeon: Cherre Robins, MD;  Location[facility] OR;  Service: Vascular;  Laterality: Right;   HPI:  Patient is a 89 y.o. female with PMH: PAD s/p recent angioplasty/PCI, asthma,  COPD, DM-2, HTN, dysphagia s/p J-tube placement on nocturnal feedings at baseline, CKD stage IIIb. She presented to the hospital with weakness, confusion, fever, LLE swelling/pain and foudn to have probable LLE cellulitis with AKI. CXR did not reveal PNA, CT abd/pelvis negtative for acute findings, Covid/flu/RSV PCR negative. BSE completed on 06/08/22 and full liquids diet started but no f/u recommended. In evening of 3/7, rapid response called due to possible aspiration event where patient had sudden desaturation, SOB and required NTS with thick white/blue secretions suctioned. NRB placed but patient weaned to 4L [place]. Patient made NPO and new SLP order for swallow evaluation placed.    Assessment / Plan / Recommendation  Clinical Impression  Patient did not present with overt s/s of aspiration or penetration with cup sips of thin liquids (water), however, she did have expectoration of bothy frothy white secretions as well as thick greenish gobs of phlegm. SLP suspects that drinking water helped loosen up phlegm. Prior to drinking water, patient did tell SLP that prior to this hospitalization she was having expectoration of white secretions as well as incidents of regurgitating thicker phlegm and or PO's. Patient's voice improved after drinking water and although she initially complained of some soreness when drinking water but it improved somewhat over time. SLP is recommending continue NPO status but allow PRN sips of water and/or ice chips after oral care. SLP will follow patient for ability to initiate PO's. SLP Visit Diagnosis: Dysphagia,  unspecified (R13.10)    Aspiration Risk  Mild aspiration risk    Diet Recommendation NPO;Ice chips PRN after oral care;Free water protocol after oral care   Liquid Administration via: Cup Medication Administration: Other (Comment) (through J-tube) Supervision: Patient able to self feed Compensations: Slow rate;Small sips/bites Postural Changes: Seated upright at 90 degrees;Remain upright for at least 30 minutes after po intake    Other  Recommendations Oral Care Recommendations: Oral care BID    Recommendations for follow up therapy are one component of a multi-disciplinary discharge planning process, led by the attending physician.  Recommendations may be updated based on patient status, additional functional criteria and insurance authorization.  Follow up Recommendations Other (comment) (TBD)      Assistance Recommended at Discharge    Functional Status Assessment Patient has had a recent decline in their functional status and demonstrates the ability to make significant improvements in function in a reasonable and predictable amount of time.  Frequency and Duration min 2x/week  1 week       Prognosis Prognosis for improved oropharyngeal function: Fair Barriers to Reach Goals: Time post onset      Swallow Study   General Date of Onset: 06/08/22 HPI: Patient is a 89 y.o. female with PMH: PAD s/p recent angioplasty/PCI, asthma, COPD, DM-2, HTN, dysphagia s/p J-tube placement on nocturnal feedings at baseline, CKD stage IIIb. She presented to the hospital with weakness, confusion, fever, LLE swelling/pain and foudn to have probable LLE cellulitis with AKI. CXR did not reveal PNA, CT abd/pelvis negtative for acute findings, Covid/flu/RSV PCR negative. BSE completed on 06/08/22 and full liquids diet started but no f/u recommended. In evening of 3/7, rapid response called due to possible aspiration event where patient had sudden desaturation, SOB and required NTS with thick white/blue  secretions suctioned. NRB placed but patient weaned to 4L [place]. Patient made NPO and new SLP order for swallow evaluation placed. Type of Study: Bedside Swallow Evaluation Previous Swallow Assessment: BSE completed 06/08/22 Diet Prior to this Study: NPO Temperature Spikes Noted:  No Respiratory Status: Room air History of Recent Intubation: No Behavior/Cognition: Alert;Cooperative;Pleasant mood Oral Cavity Assessment: Dry Oral Care Completed by SLP: Yes Oral Cavity - Dentition: Edentulous Vision: Functional for self-feeding Self-Feeding Abilities: Able to feed self Patient Positioning: Upright in bed Baseline Vocal Quality: Hoarse;Low vocal intensity;Normal Volitional Cough: Strong Volitional Swallow: Able to elicit    Oral/Motor/Sensory Function Overall Oral Motor/Sensory Function: Within functional limits   Ice Chips     Thin Liquid Thin Liquid: Within functional limits Presentation: Self Fed;Cup    Nectar Thick     Honey Thick     Puree Puree: Not tested   Solid     Solid: Not tested     Sonia Baller, MA, CCC-SLP Speech Therapy

## 2022-06-11 ENCOUNTER — Inpatient Hospital Stay (HOSPITAL_COMMUNITY): Payer: Medicare Other

## 2022-06-11 DIAGNOSIS — I4891 Unspecified atrial fibrillation: Secondary | ICD-10-CM

## 2022-06-11 DIAGNOSIS — N179 Acute kidney failure, unspecified: Secondary | ICD-10-CM | POA: Diagnosis not present

## 2022-06-11 LAB — URINALYSIS, W/ REFLEX TO CULTURE (INFECTION SUSPECTED)
Bilirubin Urine: NEGATIVE
Glucose, UA: NEGATIVE mg/dL
Hgb urine dipstick: NEGATIVE
Ketones, ur: NEGATIVE mg/dL
Leukocytes,Ua: NEGATIVE
Nitrite: NEGATIVE
Protein, ur: 30 mg/dL — AB
Specific Gravity, Urine: 1.016 (ref 1.005–1.030)
pH: 5 (ref 5.0–8.0)

## 2022-06-11 LAB — PROCALCITONIN: Procalcitonin: 2.73 ng/mL

## 2022-06-11 LAB — CBC WITH DIFFERENTIAL/PLATELET
Abs Immature Granulocytes: 0.04 10*3/uL (ref 0.00–0.07)
Basophils Absolute: 0 10*3/uL (ref 0.0–0.1)
Basophils Relative: 0 %
Eosinophils Absolute: 0.5 10*3/uL (ref 0.0–0.5)
Eosinophils Relative: 7 %
HCT: 22.2 % — ABNORMAL LOW (ref 36.0–46.0)
Hemoglobin: 7.8 g/dL — ABNORMAL LOW (ref 12.0–15.0)
Immature Granulocytes: 1 %
Lymphocytes Relative: 7 %
Lymphs Abs: 0.5 10*3/uL — ABNORMAL LOW (ref 0.7–4.0)
MCH: 34.2 pg — ABNORMAL HIGH (ref 26.0–34.0)
MCHC: 35.1 g/dL (ref 30.0–36.0)
MCV: 97.4 fL (ref 80.0–100.0)
Monocytes Absolute: 0.7 10*3/uL (ref 0.1–1.0)
Monocytes Relative: 10 %
Neutro Abs: 5.3 10*3/uL (ref 1.7–7.7)
Neutrophils Relative %: 75 %
Platelets: 201 10*3/uL (ref 150–400)
RBC: 2.28 MIL/uL — ABNORMAL LOW (ref 3.87–5.11)
RDW: 15.8 % — ABNORMAL HIGH (ref 11.5–15.5)
WBC: 7.1 10*3/uL (ref 4.0–10.5)
nRBC: 0 % (ref 0.0–0.2)

## 2022-06-11 LAB — BASIC METABOLIC PANEL
Anion gap: 11 (ref 5–15)
BUN: 72 mg/dL — ABNORMAL HIGH (ref 8–23)
CO2: 21 mmol/L — ABNORMAL LOW (ref 22–32)
Calcium: 8.5 mg/dL — ABNORMAL LOW (ref 8.9–10.3)
Chloride: 105 mmol/L (ref 98–111)
Creatinine, Ser: 2.09 mg/dL — ABNORMAL HIGH (ref 0.44–1.00)
GFR, Estimated: 21 mL/min — ABNORMAL LOW (ref >60)
Glucose, Bld: 201 mg/dL — ABNORMAL HIGH (ref 70–99)
Potassium: 5 mmol/L (ref 3.5–5.1)
Sodium: 137 mmol/L (ref 135–145)

## 2022-06-11 LAB — GLUCOSE, CAPILLARY
Glucose-Capillary: 201 mg/dL — ABNORMAL HIGH (ref 70–99)
Glucose-Capillary: 214 mg/dL — ABNORMAL HIGH (ref 70–99)
Glucose-Capillary: 200 mg/dL — ABNORMAL HIGH (ref 70–99)

## 2022-06-11 LAB — BRAIN NATRIURETIC PEPTIDE: B Natriuretic Peptide: 1824.7 pg/mL — ABNORMAL HIGH (ref 0.0–100.0)

## 2022-06-11 LAB — C-REACTIVE PROTEIN: CRP: 19.5 mg/dL — ABNORMAL HIGH (ref <1.0)

## 2022-06-11 LAB — MAGNESIUM: Magnesium: 2.5 mg/dL — ABNORMAL HIGH (ref 1.7–2.4)

## 2022-06-11 IMAGING — XA IR REPLACE G/J TUBE W/ FLUORO
3 series · 9 of 9 positions shown · non-contrast
Comparison: none

INDICATION: [AGE] female with chronic indwelling gastrojejunostomy tube.
Presents with cracking of the base of the tube.

[Series 2: fl - angio · 4 of 19 frames shown (1 of 2)]
[frame 3/19]
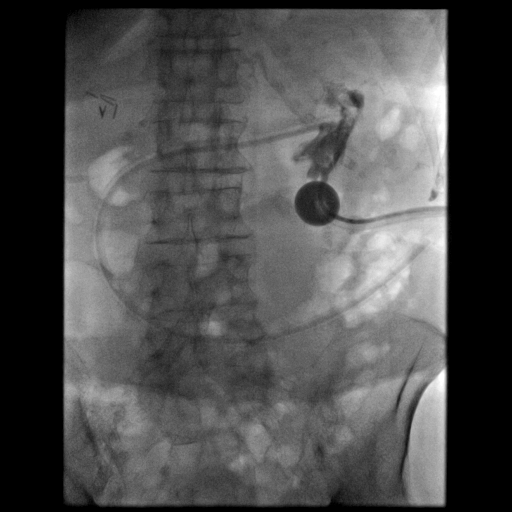
[frame 10/19]
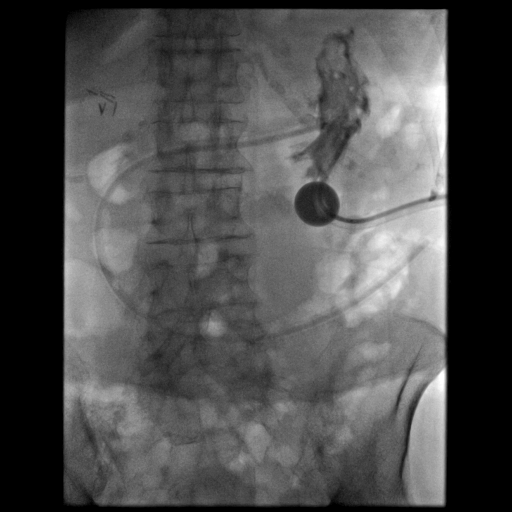
[frame 12/19]
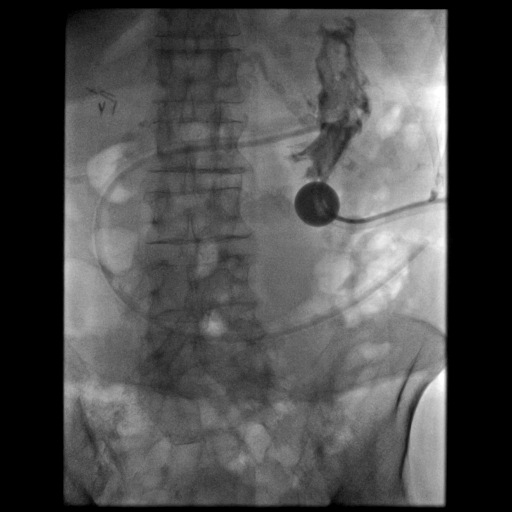
[frame 17/19]
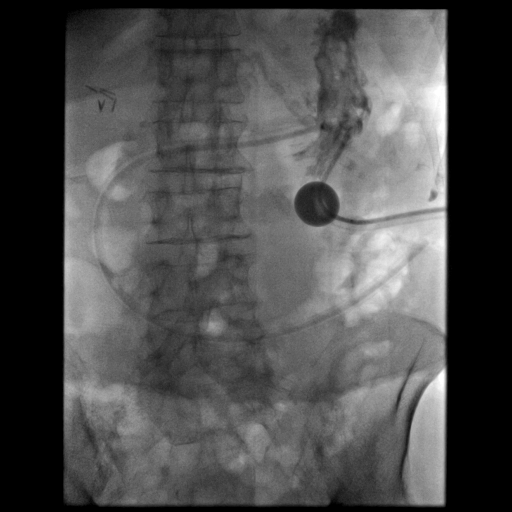

[Series 3: fl - angio · 4 of 13 frames shown (2 of 2)]
[frame 2/13]
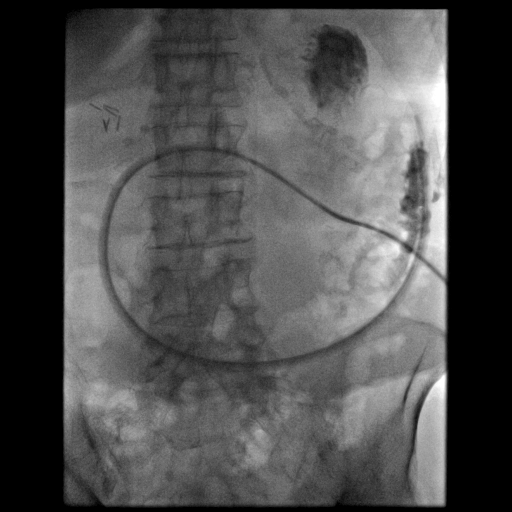
[frame 7/13]
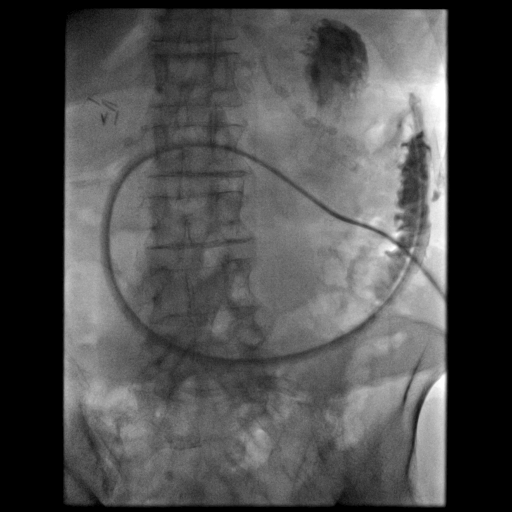
[frame 9/13]
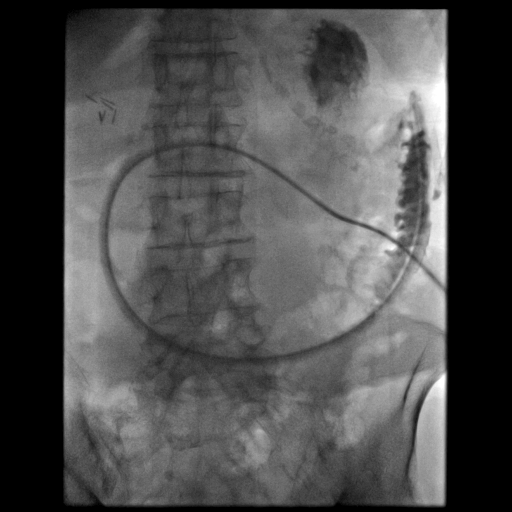
[frame 12/13]
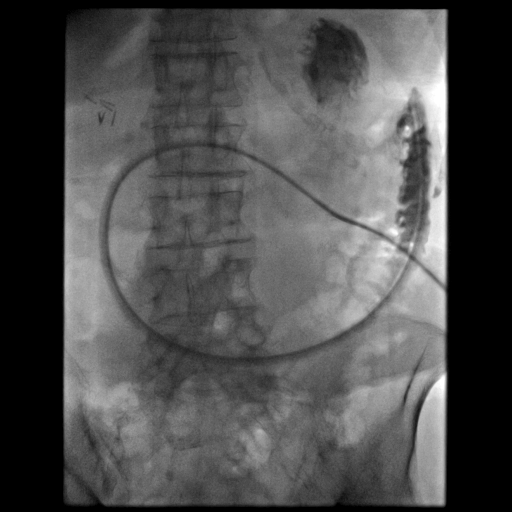

[Series 300: tube placements · 1 of 1 slices shown]
[im 1/1]
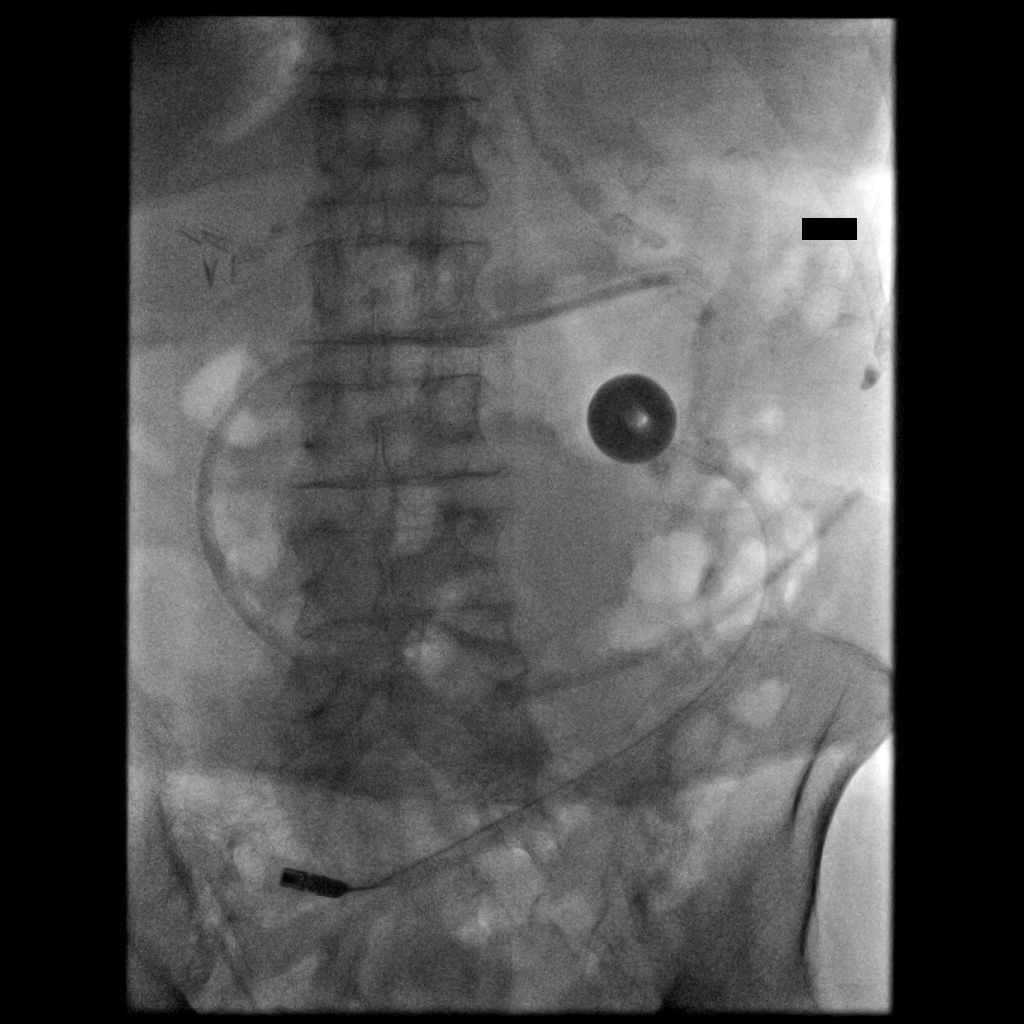

[9 of 9 positions shown; findings below may reference images not displayed]

EXAM:
JEJUNAL CATHETER REPLACEMENT

MEDICATIONS:
None

ANESTHESIA/SEDATION:
None

CONTRAST:  25mL OMNIPAQUE IOHEXOL 300 MG/ML SOLN - administered into
the gastric/jejunal lumen.

FLUOROSCOPY TIME:  Fluoroscopy Time: 2 minutes 12 seconds (10 mGy).

COMPLICATIONS:
None immediate.

PROCEDURE:
Informed written consent was obtained from the patient after a
thorough discussion of the procedural risks, benefits and
alternatives. All questions were addressed. Maximal Sterile Barrier
Technique was utilized including caps, mask, sterile gowns, sterile
gloves, sterile drape, hand hygiene and skin antiseptic. A timeout
was performed prior to the initiation of the procedure.

Gentle hand injection of the gastric and jejunal lumen was performed
prior to exchanged to confirm placement within the bowel. Digital
arm injection was suboptimal due to leaking of the contrast from
near the base of the tube where it is cracked.

The retention balloon was deflated. A stiff glidewire was inserted
into the jejunal lumen over which the gastrojejunostomy tube was
removed without difficulty. A new, 22 French gastrojejunostomy tube
was placed over the wire with the tip in the proximal jejunum. Hand
injection of contrast via the jejunal and gastric lumens confirmed
appropriate placement.

A dressing was placed. The patient tolerated the procedure well was
discharged home.
IMPRESSION: Technically successful exchange of indwelling gastrojejunostomy
tube.

## 2022-06-11 MED ORDER — DILTIAZEM LOAD VIA INFUSION
15.0000 mg | Freq: Once | INTRAVENOUS | Status: AC
  Administered 2022-06-11: 15 mg via INTRAVENOUS
  Filled 2022-06-11: qty 15

## 2022-06-11 MED ORDER — FUROSEMIDE 10 MG/ML IJ SOLN
40.0000 mg | Freq: Once | INTRAMUSCULAR | Status: AC
  Administered 2022-06-11: 40 mg via INTRAVENOUS
  Filled 2022-06-11: qty 4

## 2022-06-11 MED ORDER — GUAIFENESIN 100 MG/5ML PO LIQD
10.0000 mL | Freq: Three times a day (TID) | ORAL | Status: DC | PRN
  Administered 2022-06-11 – 2022-06-12 (×3): 10 mL via ORAL
  Filled 2022-06-11 (×3): qty 10

## 2022-06-11 MED ORDER — DILTIAZEM HCL-DEXTROSE 125-5 MG/125ML-% IV SOLN (PREMIX)
5.0000 mg/h | INTRAVENOUS | Status: AC
  Administered 2022-06-11 – 2022-06-12 (×2): 5 mg/h via INTRAVENOUS
  Filled 2022-06-11 (×2): qty 125

## 2022-06-11 MED ORDER — BENZONATATE 100 MG PO CAPS
100.0000 mg | ORAL_CAPSULE | Freq: Three times a day (TID) | ORAL | Status: DC | PRN
  Filled 2022-06-11: qty 1

## 2022-06-11 MED ORDER — SODIUM ZIRCONIUM CYCLOSILICATE 10 G PO PACK
10.0000 g | PACK | Freq: Once | ORAL | Status: AC
  Administered 2022-06-11: 10 g
  Filled 2022-06-11: qty 1

## 2022-06-11 NOTE — Progress Notes (Signed)
Pt is in rapid a fib with stable BP. She had preserved EF on recent echo. Plan to start IV Diltiazem.

## 2022-06-11 NOTE — Progress Notes (Signed)
Speech Language Pathology Treatment: Dysphagia  Patient Details Name: Shannon [name] MRN: [id number] DOB: 1923-07-18 Today's Date: 06/11/2022 Time: [id number] SLP Time Calculation (min) (ACUTE ONLY): 12 min  Assessment / Plan / Recommendation Clinical Impression  ;Followed-up with [name].  She is alert, has good memory for events of the last few days. Today she accepted sips of water and apple juice with performance that is consistent with results from 3/7 and 3/9  clinical swallowing assessments. There were no s/s of aspiration with fluids. Pt has had difficulty swallowing her pills since 2017.  She avoids most purees (e.g., applesauce) and tends to only drink liquids.    Recommend resuming full liquid diet  (so that she may have a variety of liquids); give meds via tube to minimize risk of further aspiration episodes (pills have been the culprit in the past.)   Despite these measures, with pt's hx of dysphagia (esophageal/pharyngeal), her advanced age, frailty, and the slowing of the movements of the swallowing musculature, she is at ongoing risk for occasional aspiration and its adverse consequences.    Palliative Care has been following. There are no further acute care needs from SLP perspective. D/W Dr. Candiss Norse. Our service will sign off.    HPI HPI: Patient is a 89 y.o. female with PMH: PAD s/p recent angioplasty/PCI, asthma, COPD, DM-2, HTN, dysphagia s/p J-tube placement on nocturnal feedings at baseline, CKD stage IIIb. She presented to the hospital with weakness, confusion, fever, LLE swelling/pain and foudn to have probable LLE cellulitis with AKI. CXR did not reveal PNA, CT abd/pelvis negtative for acute findings, Covid/flu/RSV PCR negative. BSE completed on 06/08/22 and full liquids diet started but no f/u recommended. In evening of 3/7, rapid response called due to possible aspiration event where patient had sudden desaturation, SOB and required NTS with thick white/blue secretions  suctioned. NRB placed but patient weaned to 4L [place]. Patient made NPO and new SLP order for swallow evaluation placed.      SLP Plan  Continue with current plan of care;All goals met      Recommendations for follow up therapy are one component of a multi-disciplinary discharge planning process, led by the attending physician.  Recommendations may be updated based on patient status, additional functional criteria and insurance authorization.    Recommendations  Liquids provided via: Cup;Straw Medication Administration: Via alternative means Supervision: Patient able to self feed Postural Changes and/or Swallow Maneuvers: Seated upright 90 degrees                Oral Care Recommendations: Oral care BID Follow Up Recommendations: No SLP follow up SLP Visit Diagnosis: Dysphagia, unspecified (R13.10) Plan: Continue with current plan of care;All goals met         Shannon [name] L. Tivis Ringer, MA CCC/SLP Clinical Specialist - Acute Care SLP Acute Rehabilitation Services Office number [phone]   Shannon [name]  06/11/2022, 9:48 AM

## 2022-06-11 NOTE — Progress Notes (Signed)
Significantly SOB on rounds. Sats 80's, heart rate 134. Administered neub treatment

## 2022-06-11 NOTE — Progress Notes (Signed)
   Medical records reviewed including progress notes, imaging, and labs.  Patient is back on nasal cannula.  Creatinine improving.  Goals of care are clear at this time for improvement and return home after medically optimizing.    MOST form was completed on 3/8 and patient indicated desire for DNR/DNI and full scope treatment, with the exception of dialysis.  PMT will continue to follow peripherally.  Patient and family have been encouraged to call with additional needs.  Thank you for your referral and allowing PMT to assist in [name]'s care.    Dorthy Cooler, [facility] Palliative Medicine Team  Team Phone # [phone]   NO CHARGE

## 2022-06-11 NOTE — Progress Notes (Addendum)
PROGRESS NOTE        PATIENT DETAILS Name: Shannon [name] Age: 89 y.o. Sex: female Date of Birth: 17-Feb-1924 Admit Date: 06/26/2022 Admitting Physician [name], DO WZ:[id number], [name], NP  Brief Summary: Patient is a 89 y.o.  female PAD-s/p recent angioplasty/PCI on 3/1, asthma/COPD overlap, DM-2, HTN, chronic HFpEF, CKD stage IIIb, dysphagia-s/p PEG tube placement-on nocturnal feedings-who presented with weakness/confusion, fever, LLE swelling/pain-she was found to have probable LLE cellulitis with AKI-and subsequently admitted to the hospitalist service.  Significant events: 3/6>> admit to [facility] 3/7>> episode of aspiration-requiring deep nasotracheal suctioning/oxygen.  Kept NPO. 3/8>> A-fib on telemetry monitor  Significant studies: 3/6>> CXR: No PNA 3/7>> CT abdomen/pelvis: No acute findings. 3/7>> echo: EF [id number], grade 2 diastolic dysfunction. 3/7>> bilateral lower extremity Doppler: No DVT. 3/8>> CXR: Right upper lobe atelectasis.  Significant microbiology data: 3/6>> COVID/influenza/RSV PCR: Negative 3/6>> blood culture: No growth  Procedures: None  Consults: VVS Palliative care  Subjective:  Patient in bed, appears comfortable, denies any headache, no fever, no chest pain or pressure, +ve shortness of breath , no abdominal pain. No new focal weakness.   Objective: Vitals: Blood pressure 126/74, pulse 87, temperature 98 F (36.7 C), temperature source Oral, resp. rate 16, height '5\' 6"'$  (1.676 m), weight 58.9 kg, SpO2 98 %.   Exam:  Awake Alert, No new F.N deficits,  [place].AT,PERRAL Supple Neck, No JVD,   Symmetrical Chest wall movement, Good air movement bilaterally, +ve rales RRR,No Gallops, Rubs or new Murmurs,  +ve B.Sounds, Abd Soft, No tenderness,  PEG 1+ leg edema    Assessment/Plan: Sepsis likely from LLE cellulitis Sepsis physiology improved Minimal erythema/edema today-better than 3/7 Dopplers negative for DVT   Will discontinue IV vancomycin-as cellulitis is minimal and nonpurulent.  Should be covered by IV Unasyn (for aspiration PNA)  Acute hypoxic respiratory failure due to aspiration pneumonia Had aspiration episode on 3/7-briefly required NRB-nasotracheal suctioning-was subsequently transition to 3-4 L of oxygen.  Improving-have transitioned her to room air today Appreciate SLP evaluation 3/9-okay to allow sips.  Per patient-she was on a liquid diet at home (gets nocturnal tube feeds).  She understands the risk of aspiration-and would want to eat liquids if offered for comfort/pleasure.   On IV Unasyn-5 days total-started 3/7  AKI on CKD stage IIIb with baseline creatinine close to 2 Suspect hemodynamically mediated in the setting of sepsis but did have recent angiography done so could be some element of contrast-induced nephropathy No hydronephrosis on CT abdomen Creatinine now downtrending Continue supportive care Avoid nephrotoxic agents  Acute encephalopathy Apparently was weak/confused when she first presented-seems to have improved rapidly Suspect this could have been from AKI Supportive care  Normocytic anemia No overt evidence of blood loss-no BM for the past several days No history of hematochezia-some intermittent dark stools but patient attributes this to iron therapy Hb levels fluctuating but relatively stable since yesterday Await FOBT Continue PPI twice daily Follow and transfuse accordingly.    Acute on chronic diastolic CHF, EF preserved at 60% on recent echo.  Lasix and monitor.    Elevated troponins Demand ischemia She has no chest pain Echo with stable EF Given AKI/advanced age-do not think she is a good candidate for any further intervention appropriate to manage this medically.  Paroxysmal atrial fibrillation Noted on telemetry monitor Rate controlled without the use of  any rate control agents Given severity of anemia/advanced age-already on DAPT-do not think  she is a good long-term anticoagulation candidate.  This was discussed with patient/daughter/granddaughter on 3/8-all understand the embolic risk and agree to keep her off anticoagulation at this point-she has had prior history of recurrent GI bleeding.  Reviewed prior charts-has had A-fib in the past-deemed not  anticoagulation candidate.  HTN BP stable Continue amlodipine  History of PAD-s/p numerous toe amputations-s/p most recent PCI on 3/1 with angioplasty plus stenting Continue DAPT Continue high intensity statin Appreciate vascular surgery input   Dysphagia Usually on nocturnal tube feeds + liquid diet at home Had aspiration episode 3/7-kept n.p.o.  SLP following Patient understands the risk of resuming oral intake-including life-threatening/life disabling risks-accepts risks-and would like to resume liquids for comfort/pleasure.  Note-tube feeds switched from nocturnal to continuous  Hypothyroidism Continue Synthroid  Peripheral neuropathy Continue Neurontin Cymbalta on hold as cannot be given through PEG tube  Asthma/COPD overlap Not in exacerbation Continue bronchodilators  Debility/deconditioning PT/OT  Palliative care Confirms DNR Suspect benefits from gentle medical treatment-clearly not a candidate for aggressive care at this point Palliative care consult  has been placed by admitting MD-will await further recommendations.  BMI: Estimated body mass index is 20.96 kg/m as calculated from the following:   Height as of this encounter: '5\' 6"'$  (1.676 m).   Weight as of this encounter: 58.9 kg.   Code status:   Code Status: DNR   DVT Prophylaxis:IV heparin   Family Communication: Daughter-[name]-[phone]-updated over the phone on 3/9   Disposition Plan: Status is: Inpatient Remains inpatient appropriate because: Severity of illness   Planned Discharge Destination:Home   Diet: Diet Order             Diet NPO time specified Except for: Other (See  Comments)  Diet effective now                    MEDICATIONS: Scheduled Meds:  amLODipine  5 mg Per Tube Daily   artificial tears   Both Eyes QHS   aspirin  81 mg Per Tube Daily   atorvastatin  40 mg Per Tube Daily   clopidogrel  75 mg Per Tube Daily   cycloSPORINE  1 drop Both Eyes BID   ferrous sulfate  300 mg Per Tube Daily   fluticasone  2 spray Each Nare Daily   free water  140 mL Per Tube TID   furosemide  40 mg Intravenous Once   gabapentin  300 mg Per Tube QHS   latanoprost  1 drop Both Eyes QHS   levothyroxine  100 mcg Per Tube Q0600   loratadine  10 mg Per Tube QPM   mometasone-formoterol  2 puff Inhalation BID   multivitamin with minerals  1 tablet Per Tube Daily   pantoprazole (PROTONIX) IV  40 mg Intravenous Q12H   polyethylene glycol  17 g Per Tube Daily   polyvinyl alcohol  1 drop Both Eyes QID   Continuous Infusions:  ampicillin-sulbactam (UNASYN) IV 3 g (06/11/22 0954)   feeding supplement (OSMOLITE 1.2 CAL) 1,000 mL (06/11/22 1016)   PRN Meds:.acetaminophen, HYDROmorphone (DILAUDID) injection, ipratropium-albuterol, oxyCODONE, polyethylene glycol   I have personally reviewed following labs and imaging studies  LABORATORY DATA:  Recent Labs  Lab 06/05/2022 2245 06/08/22 0630 06/08/22 1827 06/09/22 0842 06/10/22 0335 06/11/22 0330  WBC 12.5* 9.0 10.3 7.6 6.2 7.1  HGB 8.3* 7.3* 8.4* 7.7* 7.5* 7.8*  HCT 25.3* 22.4* 25.0* 23.3* 22.1*  22.2*  PLT 213 174 189 172 171 201  MCV 101.6* 100.0 98.4 99.1 97.4 97.4  MCH 33.3 32.6 33.1 32.8 33.0 34.2*  MCHC 32.8 32.6 33.6 33.0 33.9 35.1  RDW 15.8* 15.9* 15.7* 15.7* 15.5 15.8*  LYMPHSABS 1.0  --   --   --   --  0.5*  MONOABS 0.8  --   --   --   --  0.7  EOSABS 0.0  --   --   --   --  0.5  BASOSABS 0.1  --   --   --   --  0.0    Recent Labs  Lab 06/13/2022 2245 06/08/22 0103 06/08/22 0542 06/08/22 0630 06/09/22 0842 06/10/22 0335 06/11/22 0330  NA 131*  --   --  132* 133* 134* 137  K 5.2*  --   --   4.7 4.6 4.6 5.0  CL 100  --   --  100 101 103 105  CO2 22  --   --  24 20* 24 21*  ANIONGAP 9  --   --  '8 12 7 11  '$ GLUCOSE 131*  --   --  120* 240* 202* 201*  BUN 75*  --   --  76* 72* 68* 72*  CREATININE 2.54*  --   --  2.58* 2.45* 2.35* 2.09*  AST 28  --   --  27  --   --   --   ALT 18  --   --  22  --   --   --   ALKPHOS 75  --   --  72  --   --   --   BILITOT 0.7  --   --  0.7  --   --   --   ALBUMIN 2.8*  --   --  2.6*  --   --   --   CRP  --   --   --   --   --   --  19.5*  DDIMER  --   --  2.00*  --   --   --   --   PROCALCITON  --   --   --   --   --   --  2.73  LATICACIDVEN 1.6 1.8  --   --   --   --   --   INR 1.2  --   --   --   --   --   --   BNP  --   --   --   --   --   --  1,824.7*  MG  --   --   --  2.1  --   --  2.5*  CALCIUM 8.7*  --   --  8.6* 8.4* 8.4* 8.5*      RADIOLOGY STUDIES/RESULTS: DG Chest Port 1 View  Result Date: 06/11/2022 CLINICAL DATA:  Shortness of breath EXAM: PORTABLE CHEST 1 VIEW COMPARISON:  06/09/2022 FINDINGS: Stable cardiomegaly. Aortic atherosclerosis. Low lung volumes with streaky bibasilar opacities. Probable small bilateral pleural effusions. No pneumothorax. IMPRESSION: Low lung volumes with streaky bibasilar opacities, likely atelectasis. Probable small bilateral pleural effusions. Little interval change from prior. Electronically Signed   By: Davina Poke D.O.   On: 06/11/2022 10:07     LOS: 3 days   Signature  -    [name] on 06/11/2022 at 10:48 AM   -  To page go to [email or web address]

## 2022-06-11 NOTE — Progress Notes (Signed)
Pharmacy Antibiotic Note  Shannon [name] is a 89 y.o. female admitted on 06/16/2022 with LLE pain/cellulitis  and aspiration PNA. Pharmacy has been consulted for Unasyn.  Plan: Continue Unasyn 3g q12h Plan for 5d duration for aspiration PNA Monitor renal function, culture results and clinical progression  Height: '5\' 6"'$  (167.6 cm) Weight: 58.9 kg (129 lb 13.6 oz) IBW/kg (Calculated) : 59.3  Temp (24hrs), Avg:98.1 F (36.7 C), Min:97.6 F (36.4 C), Max:99 F (37.2 C)  Recent Labs  Lab 06/06/2022 2245 06/08/22 0103 06/08/22 0630 06/08/22 1827 06/09/22 0842 06/10/22 0335 06/11/22 0330  WBC 12.5*  --  9.0 10.3 7.6 6.2 7.1  CREATININE 2.54*  --  2.58*  --  2.45* 2.35* 2.09*  LATICACIDVEN 1.6 1.8  --   --   --   --   --      Estimated Creatinine Clearance: 14.6 mL/min (A) (by C-G formula based on SCr of 2.09 mg/dL (H)).    Allergies  Allergen Reactions   Penicillins Itching, Rash and Other (See Comments)    Tolerated amoxicillin, unasyn, zosyn & cephalosporins in the past. Did it involve swelling of the face/tongue/throat, SOB, or low BP? No Did it involve sudden or severe rash/hives, skin peeling, or any reaction on the inside of your mouth or nose? No Did you need to seek medical attention at a hospital or doctor's office? Unknown When did it last happen?      5+ years If all above answers are "NO", may proceed with cephalosporin use.      Titus Dubin, PharmD PGY1 Pharmacy Resident 06/11/2022 9:45 AM

## 2022-06-12 ENCOUNTER — Inpatient Hospital Stay (HOSPITAL_COMMUNITY): Payer: Medicare Other

## 2022-06-12 DIAGNOSIS — N179 Acute kidney failure, unspecified: Secondary | ICD-10-CM | POA: Diagnosis not present

## 2022-06-12 LAB — BASIC METABOLIC PANEL
Anion gap: 12 (ref 5–15)
BUN: 71 mg/dL — ABNORMAL HIGH (ref 8–23)
CO2: 22 mmol/L (ref 22–32)
Calcium: 8.6 mg/dL — ABNORMAL LOW (ref 8.9–10.3)
Chloride: 101 mmol/L (ref 98–111)
Creatinine, Ser: 1.91 mg/dL — ABNORMAL HIGH (ref 0.44–1.00)
GFR, Estimated: 24 mL/min — ABNORMAL LOW (ref >60)
Glucose, Bld: 299 mg/dL — ABNORMAL HIGH (ref 70–99)
Potassium: 4.9 mmol/L (ref 3.5–5.1)
Sodium: 135 mmol/L (ref 135–145)

## 2022-06-12 LAB — CULTURE, BLOOD (ROUTINE X 2)
Culture: NO GROWTH
Culture: NO GROWTH

## 2022-06-12 LAB — GLUCOSE, CAPILLARY
Glucose-Capillary: 117 mg/dL — ABNORMAL HIGH (ref 70–99)
Glucose-Capillary: 128 mg/dL — ABNORMAL HIGH (ref 70–99)
Glucose-Capillary: 188 mg/dL — ABNORMAL HIGH (ref 70–99)
Glucose-Capillary: 212 mg/dL — ABNORMAL HIGH (ref 70–99)
Glucose-Capillary: 245 mg/dL — ABNORMAL HIGH (ref 70–99)
Glucose-Capillary: 251 mg/dL — ABNORMAL HIGH (ref 70–99)
Glucose-Capillary: 263 mg/dL — ABNORMAL HIGH (ref 70–99)
Glucose-Capillary: 286 mg/dL — ABNORMAL HIGH (ref 70–99)
Glucose-Capillary: 96 mg/dL (ref 70–99)

## 2022-06-12 LAB — CBC WITH DIFFERENTIAL/PLATELET
Abs Immature Granulocytes: 0.05 10*3/uL (ref 0.00–0.07)
Basophils Absolute: 0 10*3/uL (ref 0.0–0.1)
Basophils Relative: 0 %
Eosinophils Absolute: 0.2 10*3/uL (ref 0.0–0.5)
Eosinophils Relative: 3 %
HCT: 24.2 % — ABNORMAL LOW (ref 36.0–46.0)
Hemoglobin: 8.1 g/dL — ABNORMAL LOW (ref 12.0–15.0)
Immature Granulocytes: 1 %
Lymphocytes Relative: 6 %
Lymphs Abs: 0.5 10*3/uL — ABNORMAL LOW (ref 0.7–4.0)
MCH: 33.5 pg (ref 26.0–34.0)
MCHC: 33.5 g/dL (ref 30.0–36.0)
MCV: 100 fL (ref 80.0–100.0)
Monocytes Absolute: 0.7 10*3/uL (ref 0.1–1.0)
Monocytes Relative: 9 %
Neutro Abs: 6.3 10*3/uL (ref 1.7–7.7)
Neutrophils Relative %: 81 %
Platelets: 240 10*3/uL (ref 150–400)
RBC: 2.42 MIL/uL — ABNORMAL LOW (ref 3.87–5.11)
RDW: 15.6 % — ABNORMAL HIGH (ref 11.5–15.5)
WBC: 7.8 10*3/uL (ref 4.0–10.5)
nRBC: 0 % (ref 0.0–0.2)

## 2022-06-12 LAB — HEMOGLOBIN A1C
Hgb A1c MFr Bld: 6.4 % — ABNORMAL HIGH (ref 4.8–5.6)
Mean Plasma Glucose: 137 mg/dL

## 2022-06-12 LAB — TYPE AND SCREEN
ABO/RH(D): AB POS
Antibody Screen: POSITIVE
DAT, IgG: POSITIVE
Donor AG Type: NEGATIVE
Donor AG Type: NEGATIVE
Unit division: 0
Unit division: 0

## 2022-06-12 LAB — BPAM RBC
Blood Product Expiration Date: 202403152359
Blood Product Expiration Date: 202403192359
ISSUE DATE / TIME: 202402140926
Unit Type and Rh: 6200
Unit Type and Rh: 6200

## 2022-06-12 LAB — MAGNESIUM: Magnesium: 2.3 mg/dL (ref 1.7–2.4)

## 2022-06-12 LAB — PROCALCITONIN: Procalcitonin: 1.72 ng/mL

## 2022-06-12 LAB — BRAIN NATRIURETIC PEPTIDE: B Natriuretic Peptide: 1517.1 pg/mL — ABNORMAL HIGH (ref 0.0–100.0)

## 2022-06-12 LAB — C-REACTIVE PROTEIN: CRP: 16.1 mg/dL — ABNORMAL HIGH (ref <1.0)

## 2022-06-12 MED ORDER — CHLORHEXIDINE GLUCONATE CLOTH 2 % EX PADS
6.0000 | MEDICATED_PAD | Freq: Every day | CUTANEOUS | Status: DC
  Administered 2022-06-12 – 2022-06-14 (×3): 6 via TOPICAL

## 2022-06-12 MED ORDER — INSULIN ASPART 100 UNIT/ML IJ SOLN
0.0000 [IU] | Freq: Four times a day (QID) | INTRAMUSCULAR | Status: DC
  Administered 2022-06-12: 8 [IU] via SUBCUTANEOUS
  Administered 2022-06-12: 2 [IU] via SUBCUTANEOUS
  Administered 2022-06-13: 3 [IU] via SUBCUTANEOUS
  Administered 2022-06-13: 5 [IU] via SUBCUTANEOUS
  Administered 2022-06-13: 3 [IU] via SUBCUTANEOUS

## 2022-06-12 MED ORDER — METOLAZONE 2.5 MG PO TABS
2.5000 mg | ORAL_TABLET | Freq: Once | ORAL | Status: AC
  Administered 2022-06-12: 2.5 mg via ORAL
  Filled 2022-06-12: qty 1

## 2022-06-12 MED ORDER — OSMOLITE 1.2 CAL PO LIQD
1000.0000 mL | ORAL | Status: DC
  Administered 2022-06-12: 1000 mL via JEJUNOSTOMY
  Filled 2022-06-12 (×2): qty 1000

## 2022-06-12 MED ORDER — SPIRONOLACTONE 25 MG PO TABS: 25.0000 mg | ORAL_TABLET | Freq: Once | ORAL | Status: DC

## 2022-06-12 MED ORDER — DILTIAZEM HCL 25 MG/5ML IV SOLN
10.0000 mg | Freq: Four times a day (QID) | INTRAVENOUS | Status: DC | PRN
  Administered 2022-06-12 – 2022-06-15 (×2): 10 mg via INTRAVENOUS
  Filled 2022-06-12 (×3): qty 5

## 2022-06-12 MED ORDER — DILTIAZEM HCL 60 MG PO TABS
90.0000 mg | ORAL_TABLET | Freq: Three times a day (TID) | ORAL | Status: DC
  Administered 2022-06-12 – 2022-06-15 (×9): 90 mg via ORAL
  Filled 2022-06-12 (×9): qty 1

## 2022-06-12 MED ORDER — FUROSEMIDE 10 MG/ML IJ SOLN
80.0000 mg | Freq: Two times a day (BID) | INTRAMUSCULAR | Status: DC
  Administered 2022-06-12 (×2): 80 mg via INTRAVENOUS
  Filled 2022-06-12 (×2): qty 8

## 2022-06-12 NOTE — Care Management Important Message (Signed)
Important Message  Patient Details  Name: Shannon [name] MRN: [id number] Date of Birth: 07/04/1923   Medicare Important Message Given:  Yes     [name] Montine Circle 06/12/2022, 3:34 PM

## 2022-06-12 NOTE — Progress Notes (Signed)
PROGRESS NOTE        PATIENT DETAILS Name: Shannon [name] Age: 89 y.o. Sex: female Date of Birth: Jul 20, 1923 Admit Date: 06/08/2022 Admitting Physician [name], DO WZ:[id number], [name], NP  Brief Summary: Patient is a 89 y.o.  female PAD-s/p recent angioplasty/PCI on 3/1, asthma/COPD overlap, DM-2, HTN, chronic HFpEF, CKD stage IIIb, dysphagia-s/p PEG tube placement-on nocturnal feedings-who presented with weakness/confusion, fever, LLE swelling/pain-she was found to have probable LLE cellulitis with AKI-and subsequently admitted to the hospitalist service.  Significant events: 3/6>> admit to [facility] 3/7>> episode of aspiration-requiring deep nasotracheal suctioning/oxygen.  Kept NPO. 3/8>> A-fib on telemetry monitor  Significant studies: 3/6>> CXR: No PNA 3/7>> CT abdomen/pelvis: No acute findings. 3/7>> echo: EF [id number], grade 2 diastolic dysfunction. 3/7>> bilateral lower extremity Doppler: No DVT. 3/8>> CXR: Right upper lobe atelectasis.  Significant microbiology data: 3/6>> COVID/influenza/RSV PCR: Negative 3/6>> blood culture: No growth  Procedures: None  Consults: VVS Palliative care  Subjective:  Patient in bed denies any headache or chest pain does have a lot of shortness of breath and frothy saliva coming from her mouth, no abdominal pain or discomfort.   Objective: Vitals: Blood pressure (!) 145/107, pulse 76, temperature (!) 97.1 F (36.2 C), temperature source Oral, resp. rate (!) 21, height '5\' 6"'$  (1.676 m), weight 59.3 kg, SpO2 91 %.   Exam:  Awake Alert, No new F.N deficits,  .AT,PERRAL Supple Neck, No JVD,   Symmetrical Chest wall movement, Good air movement bilaterally, +ve rales RRR,No Gallops, Rubs or new Murmurs,  +ve B.Sounds, Abd Soft, No tenderness,  PEG 1+ leg edema    Assessment/Plan: Sepsis likely from LLE cellulitis Sepsis physiology improved Minimal erythema/edema today-better than 3/7 Dopplers  negative for DVT  Will discontinue IV vancomycin-as cellulitis is minimal and nonpurulent.  Should be covered by IV Unasyn (for aspiration PNA)  Acute hypoxic respiratory failure due to aspiration pneumonia, ongoing aspiration of saliva and likely gastric contents along with acute on chronic CHF. Had aspiration episode on 3/7-briefly required NRB-nasotracheal suctioning-was subsequently transition to 3-4 L of oxygen.  Improving-have transitioned her to room air today Appreciate SLP evaluation 3/9-okay to allow sips.  Per patient-she was on a liquid diet at home (gets nocturnal tube feeds).  She understands the risk of aspiration-and would want to eat liquids if offered for comfort/pleasure.   On IV Unasyn-5 days total-started 3/7 Unfortunately continued aspiration of both oral intake and likely tube feeds and saliva, much worse night of 06/11/2022, tube feeding held for a few hours thereafter at a lower rate, had long discussion with patient and patient's daughter along with granddaughter bedside on her poor prognosis on 06/12/2022.  They will discuss amongst themselves and let us know about long-term plan on 06/13/2022.  AKI on CKD stage IIIb with baseline creatinine close to 2 Suspect hemodynamically mediated in the setting of sepsis but did have recent angiography done so could be some element of contrast-induced nephropathy No hydronephrosis on CT abdomen Creatinine now downtrending Continue supportive care Avoid nephrotoxic agents  Acute encephalopathy Apparently was weak/confused when she first presented-seems to have improved rapidly Suspect this could have been from AKI Supportive care  Normocytic anemia No overt evidence of blood loss-no BM for the past several days No history of hematochezia-some intermittent dark stools but patient attributes this to iron therapy Hb levels fluctuating but  relatively stable since yesterday Await FOBT Continue PPI twice daily Follow and transfuse  accordingly.    Acute on chronic diastolic CHF, EF preserved at 60% on recent echo.  High-dose Lasix repeated on 06/12/2022 along with Zaroxolyn and monitor.  Elevated troponins Demand ischemia She has no chest pain Echo with stable EF Given AKI/advanced age-do not think she is a good candidate for any further intervention appropriate to manage this medically.  Paroxysmal atrial fibrillation Noted on telemetry monitor Rate controlled without the use of any rate control agents Given severity of anemia/advanced age-already on DAPT-do not think she is a good long-term anticoagulation candidate.  This was discussed with patient/daughter/granddaughter on 3/8-all understand the embolic risk and agree to keep her off anticoagulation at this point-she has had prior history of recurrent GI bleeding.  Reviewed prior charts-has had A-fib in the past-deemed not  anticoagulation candidate.  HTN BP stable Continue amlodipine  History of PAD-s/p numerous toe amputations-s/p most recent PCI on 3/1 with angioplasty plus stenting Continue DAPT Continue high intensity statin Appreciate vascular surgery input   Dysphagia Usually on nocturnal tube feeds + liquid diet at home Had aspiration episode 3/7-kept n.p.o.  SLP following Patient understands the risk of resuming oral intake-including life-threatening/life disabling risks-accepts risks-and would like to resume liquids for comfort/pleasure.  Note-tube feeds switched from nocturnal to continuous  Hypothyroidism Continue Synthroid  Peripheral neuropathy Continue Neurontin Cymbalta on hold as cannot be given through PEG tube  Asthma/COPD overlap Not in exacerbation Continue bronchodilators  Debility/deconditioning PT/OT  Palliative care Confirms DNR Suspect benefits from gentle medical treatment-clearly not a candidate for aggressive care at this point Palliative care consult  has been placed by admitting MD-will await further  recommendations.  BMI: Estimated body mass index is 21.1 kg/m as calculated from the following:   Height as of this encounter: '5\' 6"'$  (1.676 m).   Weight as of this encounter: 59.3 kg.   Code status:   Code Status: DNR   DVT Prophylaxis:IV heparin   Family Communication: [id number] grand daughter updated bedside on 06/12/2022 in detail   Disposition Plan: Status is: Inpatient Remains inpatient appropriate because: Severity of illness   Planned Discharge Destination:Home   Diet: Diet Order             Diet NPO time specified Except for: Other (See Comments)  Diet effective now                    MEDICATIONS: Scheduled Meds:  artificial tears   Both Eyes QHS   aspirin  81 mg Per Tube Daily   atorvastatin  40 mg Per Tube Daily   clopidogrel  75 mg Per Tube Daily   cycloSPORINE  1 drop Both Eyes BID   diltiazem  90 mg Oral Q8H   ferrous sulfate  300 mg Per Tube Daily   fluticasone  2 spray Each Nare Daily   free water  140 mL Per Tube TID   furosemide  80 mg Intravenous BID   gabapentin  300 mg Per Tube QHS   insulin aspart  0-15 Units Subcutaneous Q6H   latanoprost  1 drop Both Eyes QHS   levothyroxine  100 mcg Per Tube Q0600   loratadine  10 mg Per Tube QPM   mometasone-formoterol  2 puff Inhalation BID   multivitamin with minerals  1 tablet Per Tube Daily   pantoprazole (PROTONIX) IV  40 mg Intravenous Q12H   polyethylene glycol  17 g Per Tube Daily  polyvinyl alcohol  1 drop Both Eyes QID   Continuous Infusions:  ampicillin-sulbactam (UNASYN) IV 3 g (06/12/22 1031)   diltiazem (CARDIZEM) infusion 5 mg/hr (06/12/22 1015)   feeding supplement (OSMOLITE 1.2 CAL)     PRN Meds:.acetaminophen, diltiazem, guaiFENesin, HYDROmorphone (DILAUDID) injection, ipratropium-albuterol, oxyCODONE, polyethylene glycol   I have personally reviewed following labs and imaging studies  LABORATORY DATA:  Recent Labs  Lab 06/10/2022 2245  06/08/22 0630 06/08/22 1827 06/09/22 0842 06/10/22 0335 06/11/22 0330 06/12/22 0219  WBC 12.5*   < > 10.3 7.6 6.2 7.1 7.8  HGB 8.3*   < > 8.4* 7.7* 7.5* 7.8* 8.1*  HCT 25.3*   < > 25.0* 23.3* 22.1* 22.2* 24.2*  PLT 213   < > 189 172 171 201 240  MCV 101.6*   < > 98.4 99.1 97.4 97.4 100.0  MCH 33.3   < > 33.1 32.8 33.0 34.2* 33.5  MCHC 32.8   < > 33.6 33.0 33.9 35.1 33.5  RDW 15.8*   < > 15.7* 15.7* 15.5 15.8* 15.6*  LYMPHSABS 1.0  --   --   --   --  0.5* 0.5*  MONOABS 0.8  --   --   --   --  0.7 0.7  EOSABS 0.0  --   --   --   --  0.5 0.2  BASOSABS 0.1  --   --   --   --  0.0 0.0   < > = values in this interval not displayed.    Recent Labs  Lab 06/27/2022 2245 06/08/22 0103 06/08/22 0542 06/08/22 0630 06/09/22 0842 06/10/22 0335 06/11/22 0330 06/12/22 0219  NA 131*  --   --  132* 133* 134* 137 135  K 5.2*  --   --  4.7 4.6 4.6 5.0 4.9  CL 100  --   --  100 101 103 105 101  CO2 22  --   --  24 20* 24 21* 22  ANIONGAP 9  --   --  '8 12 7 11 12  '$ GLUCOSE 131*  --   --  120* 240* 202* 201* 299*  BUN 75*  --   --  76* 72* 68* 72* 71*  CREATININE 2.54*  --   --  2.58* 2.45* 2.35* 2.09* 1.91*  AST 28  --   --  27  --   --   --   --   ALT 18  --   --  22  --   --   --   --   ALKPHOS 75  --   --  72  --   --   --   --   BILITOT 0.7  --   --  0.7  --   --   --   --   ALBUMIN 2.8*  --   --  2.6*  --   --   --   --   CRP  --   --   --   --   --   --  19.5* 16.1*  DDIMER  --   --  2.00*  --   --   --   --   --   PROCALCITON  --   --   --   --   --   --  2.73 1.72  LATICACIDVEN 1.6 1.8  --   --   --   --   --   --   INR 1.2  --   --   --   --   --   --   --  BNP  --   --   --   --   --   --  1,824.7* 1,517.1*  MG  --   --   --  2.1  --   --  2.5* 2.3  CALCIUM 8.7*  --   --  8.6* 8.4* 8.4* 8.5* 8.6*      RADIOLOGY STUDIES/RESULTS: DG Chest Port 1 View  Result Date: 06/12/2022 CLINICAL DATA:  [id number] shortness of breath and CHF. EXAM: PORTABLE CHEST 1 VIEW COMPARISON:   Portable chest yesterday at 7:19 a.m. FINDINGS: 6:30 a.m. The heart is enlarged. No vascular congestion is seen. Chronic elevation right hemidiaphragm. The aorta is calcified and mildly tortuous with stable mediastinum. Dense left lower lobe consolidation continues to be seen with small pleural effusions. The remaining lungs are clear. No new or worsening infiltrate is seen. Asymmetric left shoulder DJD is again shown. IMPRESSION: 1. Stable dense left lower lobe consolidation and small pleural effusions. No new or worsening infiltrate is seen. Right base is largely obscured by the elevated hemidiaphragm. 2. Stable cardiomegaly. Electronically Signed   By: Telford Nab M.D.   On: 06/12/2022 06:49   DG Chest Port 1 View  Result Date: 06/11/2022 CLINICAL DATA:  Shortness of breath EXAM: PORTABLE CHEST 1 VIEW COMPARISON:  06/09/2022 FINDINGS: Stable cardiomegaly. Aortic atherosclerosis. Low lung volumes with streaky bibasilar opacities. Probable small bilateral pleural effusions. No pneumothorax. IMPRESSION: Low lung volumes with streaky bibasilar opacities, likely atelectasis. Probable small bilateral pleural effusions. Little interval change from prior. Electronically Signed   By: Davina Poke D.O.   On: 06/11/2022 10:07     LOS: 4 days   Signature  -    [name] on 06/12/2022 at 12:23 PM   -  To page go to [email or web address]

## 2022-06-12 NOTE — Progress Notes (Signed)
Occupational Therapy Treatment Patient Details Name: Shannon [name] MRN: [id number] DOB: 1923/05/14 Today's Date: 06/12/2022   History of present illness Pt is a 89 year old woman who presented to ED on 3/6 with AMS, fever and generalized pain. + L LE cellulitis and sepsis. PMH: L SFA stent placed 3/1, R transtarsal amputation, PAD, HTN afib, g-tube 2* bowel obstructions, CADm CKD, COPD, diabetic polyneuropathy, anxiety, depression.   OT comments  Pt needing encouragement for activity today, but eventually agreed to sit EOB and performed one sit to stand. Pt declined OOB to chair. Daughter available during session and stating she manage pt at home at moderate assistance observed today for bed mobility and to stand. Pt requiring more O2 than upon eval, on 10L O2 with SpO2 >90%. Encouraged use of IS and flutter valve.   Recommendations for follow up therapy are one component of a multi-disciplinary discharge planning process, led by the attending physician.  Recommendations may be updated based on patient status, additional functional criteria and insurance authorization.    Follow Up Recommendations  Home health OT     Assistance Recommended at Discharge Frequent or constant Supervision/Assistance  Patient can return home with the following  A lot of help with walking and/or transfers;A lot of help with bathing/dressing/bathroom;Assistance with cooking/housework;Direct supervision/assist for medications management;Direct supervision/assist for financial management;Assist for transportation;Help with stairs or ramp for entrance   Equipment Recommendations  None recommended by OT    Recommendations for Other Services      Precautions / Restrictions Precautions Precautions: Fall Precaution Comments: chronic afib, g-tube, watch O2 Restrictions Weight Bearing Restrictions: No       Mobility Bed Mobility Overal bed mobility: Needs Assistance Bed Mobility: Supine to Sit, Sit to Supine      Supine to sit: Min assist, HOB elevated Sit to supine: Mod assist   General bed mobility comments: min assist to raise trunk and for hips to EOB, light mod assist for LEs back into bed    Transfers Overall transfer level: Needs assistance Equipment used: Rolling walker (2 wheels) Transfers: Sit to/from Stand Sit to Stand: Mod assist           General transfer comment: elevated bed slightly, assist to rise and steady with one hand on bed, one on RW, posterio bias, flexed posture     Balance Overall balance assessment: Needs assistance   Sitting balance-Leahy Scale: Fair     Standing balance support: During functional activity, Bilateral upper extremity supported, Reliant on assistive device for balance Standing balance-Leahy Scale: Poor Standing balance comment: RW and min assist                           ADL either performed or assessed with clinical judgement   ADL                       Lower Body Dressing: Bed level;Total assistance                      Extremity/Trunk Assessment              Vision       Perception     Praxis      Cognition Arousal/Alertness: Awake/alert Behavior During Therapy: WFL for tasks assessed/performed Overall Cognitive Status: Within Functional Limits for tasks assessed  Exercises      Shoulder Instructions       General Comments      Pertinent Vitals/ Pain       Pain Assessment Pain Assessment: No/denies pain  Home Living                                          Prior Functioning/Environment              Frequency  Min 2X/week        Progress Toward Goals  OT Goals(current goals can now be found in the care plan section)  Progress towards OT goals: Progressing toward goals  Acute Rehab OT Goals OT Goal Formulation: With patient Time For Goal Achievement: 06/23/22 Potential to Achieve  Goals: Good  Plan Discharge plan remains appropriate    Co-evaluation                 AM-PAC OT "6 Clicks" Daily Activity     Outcome Measure   Help from another person eating meals?: None Help from another person taking care of personal grooming?: A Little Help from another person toileting, which includes using toliet, bedpan, or urinal?: Total Help from another person bathing (including washing, rinsing, drying)?: A Lot Help from another person to put on and taking off regular upper body clothing?: A Little Help from another person to put on and taking off regular lower body clothing?: Total 6 Click Score: 14    End of Session Equipment Utilized During Treatment: Rolling walker (2 wheels);Oxygen (10L)  OT Visit Diagnosis: Unsteadiness on feet (R26.81);Other abnormalities of gait and mobility (R26.89);Muscle weakness (generalized) (M62.81)   Activity Tolerance Patient tolerated treatment well   Patient Left in bed;with call bell/phone within reach;with bed alarm set;with family/visitor present;with nursing/sitter in room   Nurse Communication Mobility status        Time: 1110-1131 OT Time Calculation (min): 21 min  Charges: OT General Charges $OT Visit: 1 Visit OT Treatments $Therapeutic Activity: 8-22 mins  Cleta Alberts, OTR/L Acute Rehabilitation Services Office: [phone]   [name] 06/12/2022, 11:38 AM

## 2022-06-12 NOTE — Progress Notes (Signed)
   06/12/22 1400  Spiritual Encounters  Type of Visit Initial  Care provided to: Patient  Referral source IDT Rounds  Reason for visit Routine spiritual support  OnCall Visit No  Spiritual Framework  Presenting Themes Values and beliefs  Values/beliefs faith  Community/Connection Family  Patient Stress Factors Health changes  Interventions  Spiritual Care Interventions Made Reflective listening;Established relationship of care and support;Meaning making   Ch visited pt while making rounds. Pt's said that her hope and strength comes from her faith. She feels at peace because the Reita Cliche is with her. Ch accompany pt in singing and praising. Ch offered a word of prayer. No follow-up needed at this time.

## 2022-06-13 ENCOUNTER — Inpatient Hospital Stay (HOSPITAL_COMMUNITY): Payer: Medicare Other

## 2022-06-13 DIAGNOSIS — Z7189 Other specified counseling: Secondary | ICD-10-CM | POA: Diagnosis not present

## 2022-06-13 DIAGNOSIS — Z66 Do not resuscitate: Secondary | ICD-10-CM

## 2022-06-13 DIAGNOSIS — Z711 Person with feared health complaint in whom no diagnosis is made: Secondary | ICD-10-CM

## 2022-06-13 DIAGNOSIS — N179 Acute kidney failure, unspecified: Secondary | ICD-10-CM | POA: Diagnosis not present

## 2022-06-13 DIAGNOSIS — A419 Sepsis, unspecified organism: Secondary | ICD-10-CM | POA: Diagnosis not present

## 2022-06-13 DIAGNOSIS — Z789 Other specified health status: Secondary | ICD-10-CM

## 2022-06-13 DIAGNOSIS — Z515 Encounter for palliative care: Secondary | ICD-10-CM | POA: Diagnosis not present

## 2022-06-13 LAB — GLUCOSE, CAPILLARY
Glucose-Capillary: 152 mg/dL — ABNORMAL HIGH (ref 70–99)
Glucose-Capillary: 154 mg/dL — ABNORMAL HIGH (ref 70–99)
Glucose-Capillary: 199 mg/dL — ABNORMAL HIGH (ref 70–99)

## 2022-06-13 LAB — CBC WITH DIFFERENTIAL/PLATELET
Abs Immature Granulocytes: 0.13 10*3/uL — ABNORMAL HIGH (ref 0.00–0.07)
Basophils Absolute: 0 10*3/uL (ref 0.0–0.1)
Basophils Relative: 0 %
Eosinophils Absolute: 0.2 10*3/uL (ref 0.0–0.5)
Eosinophils Relative: 2 %
HCT: 23.1 % — ABNORMAL LOW (ref 36.0–46.0)
Hemoglobin: 8 g/dL — ABNORMAL LOW (ref 12.0–15.0)
Immature Granulocytes: 1 %
Lymphocytes Relative: 5 %
Lymphs Abs: 0.6 10*3/uL — ABNORMAL LOW (ref 0.7–4.0)
MCH: 33.6 pg (ref 26.0–34.0)
MCHC: 34.6 g/dL (ref 30.0–36.0)
MCV: 97.1 fL (ref 80.0–100.0)
Monocytes Absolute: 1 10*3/uL (ref 0.1–1.0)
Monocytes Relative: 7 %
Neutro Abs: 10.9 10*3/uL — ABNORMAL HIGH (ref 1.7–7.7)
Neutrophils Relative %: 85 %
Platelets: 289 10*3/uL (ref 150–400)
RBC: 2.38 MIL/uL — ABNORMAL LOW (ref 3.87–5.11)
RDW: 15.7 % — ABNORMAL HIGH (ref 11.5–15.5)
WBC: 12.8 10*3/uL — ABNORMAL HIGH (ref 4.0–10.5)
nRBC: 0.2 % (ref 0.0–0.2)

## 2022-06-13 LAB — BASIC METABOLIC PANEL
Anion gap: 15 (ref 5–15)
BUN: 82 mg/dL — ABNORMAL HIGH (ref 8–23)
CO2: 20 mmol/L — ABNORMAL LOW (ref 22–32)
Calcium: 8.7 mg/dL — ABNORMAL LOW (ref 8.9–10.3)
Chloride: 100 mmol/L (ref 98–111)
Creatinine, Ser: 2.34 mg/dL — ABNORMAL HIGH (ref 0.44–1.00)
GFR, Estimated: 19 mL/min — ABNORMAL LOW (ref >60)
Glucose, Bld: 204 mg/dL — ABNORMAL HIGH (ref 70–99)
Potassium: 5.6 mmol/L — ABNORMAL HIGH (ref 3.5–5.1)
Sodium: 135 mmol/L (ref 135–145)

## 2022-06-13 LAB — MAGNESIUM: Magnesium: 2.3 mg/dL (ref 1.7–2.4)

## 2022-06-13 LAB — C-REACTIVE PROTEIN: CRP: 16.6 mg/dL — ABNORMAL HIGH (ref <1.0)

## 2022-06-13 LAB — BRAIN NATRIURETIC PEPTIDE: B Natriuretic Peptide: 1356.3 pg/mL — ABNORMAL HIGH (ref 0.0–100.0)

## 2022-06-13 LAB — PROCALCITONIN: Procalcitonin: 1.55 ng/mL

## 2022-06-13 MED ORDER — SODIUM CHLORIDE 0.9 % IV SOLN
3.0000 g | Freq: Two times a day (BID) | INTRAVENOUS | Status: DC
  Administered 2022-06-13 – 2022-06-15 (×5): 3 g via INTRAVENOUS
  Filled 2022-06-13 (×4): qty 8

## 2022-06-13 MED ORDER — DEXTROSE 5 % IV SOLN: INTRAVENOUS | Status: DC

## 2022-06-13 MED ORDER — SODIUM ZIRCONIUM CYCLOSILICATE 10 G PO PACK
10.0000 g | PACK | Freq: Three times a day (TID) | ORAL | Status: AC
  Administered 2022-06-13 (×3): 10 g via ORAL
  Filled 2022-06-13 (×3): qty 1

## 2022-06-13 NOTE — Progress Notes (Addendum)
PROGRESS NOTE        PATIENT DETAILS Name: Shannon [name] Age: 89 y.o. Sex: female Date of Birth: March 24, 1924 Admit Date: 06/22/2022 Admitting Physician [name], DO [id number], [name], NP  Brief Summary: Patient is a 89 y.o.  female PAD-s/p recent angioplasty/PCI on 3/1, asthma/COPD overlap, DM-2, HTN, chronic HFpEF, CKD stage IIIb, dysphagia-s/p PEG tube placement-on nocturnal feedings-who presented with weakness/confusion, fever, LLE swelling/pain-she was found to have probable LLE cellulitis with AKI-and subsequently admitted to the hospitalist service.  Significant events: 3/6>> admit to [facility] 3/7>> episode of aspiration-requiring deep nasotracheal suctioning/oxygen.  Kept NPO. 3/8>> A-fib on telemetry monitor  Significant studies: 3/6>> CXR: No PNA 3/7>> CT abdomen/pelvis: No acute findings. 3/7>> echo: EF [id number], grade 2 diastolic dysfunction. 3/7>> bilateral lower extremity Doppler: No DVT. 3/8>> CXR: Right upper lobe atelectasis.  Significant microbiology data: 3/6>> COVID/influenza/RSV PCR: Negative 3/6>> blood culture: No growth  Procedures: None  Consults: VVS Palliative care  Subjective:  Patient in bed appears short of breath wearing BiPAP, denies any headache chest or abdominal pain.   Objective: Vitals: Blood pressure (!) 117/49, pulse (!) 58, temperature 98.2 F (36.8 C), temperature source Oral, resp. rate 18, height '5\' 6"'$  (1.676 m), weight 59.3 kg, SpO2 99 %.   Exam:  Awake Alert, No new F.N deficits,  [place].AT,PERRAL Supple Neck, No JVD,   Symmetrical Chest wall movement, Good air movement bilaterally, +ve rales RRR,No Gallops, Rubs or new Murmurs,  +ve B.Sounds, Abd Soft, No tenderness,  PEG 1+ leg edema    Assessment/Plan: Sepsis likely from LLE cellulitis Sepsis physiology improved Minimal erythema/edema today-better than 3/7 Dopplers negative for DVT  Will discontinue IV vancomycin-as cellulitis is  minimal and nonpurulent.  Should be covered by IV Unasyn (for aspiration PNA)  Acute hypoxic respiratory failure due to aspiration pneumonia, ongoing aspiration of saliva and likely gastric contents along with acute on chronic CHF. Had aspiration episode on 3/7-briefly required NRB-nasotracheal suctioning-was subsequently transition to 3-4 L of oxygen.  Improving-have transitioned her to room air today Appreciate SLP evaluation 3/9-okay to allow sips.  Per patient-she was on a liquid diet at home (gets nocturnal tube feeds).  She understands the risk of aspiration-and would want to eat liquids if offered for comfort/pleasure.   On IV Unasyn-5 days total-started 3/7 Unfortunately continued aspiration of both oral intake and likely tube feeds and saliva, much worse night of 06/11/2022, tube feeding held for a few hours thereafter at a lower rate, had long discussion with patient and patient's daughter along with granddaughter bedside on her poor prognosis on 06/12/2022.  They will discuss amongst themselves and let us know about long-term plan on 06/13/2022.  Chest x-ray continues to get worse on 06/13/2022 Unasyn added, requiring BiPAP almost every night, again prognosis appears extremely poor.  Will request palliative care also to reevaluate the patient and discuss with family goals of care options on 06/13/2022.  AKI on CKD stage IIIb with baseline creatinine close to 2 Suspect hemodynamically mediated in the setting of sepsis but did have recent angiography done so could be some element of contrast-induced nephropathy No hydronephrosis on CT abdomen Creatinine now downtrending Continue supportive care Avoid nephrotoxic agents  Acute encephalopathy Apparently was weak/confused when she first presented-seems to have improved rapidly Suspect this could have been from AKI Supportive care  Normocytic anemia No overt evidence  of blood loss-no BM for the past several days No history of hematochezia-some  intermittent dark stools but patient attributes this to iron therapy Hb levels fluctuating but relatively stable since yesterday Await FOBT Continue PPI twice daily Follow and transfuse accordingly.    Acute on chronic diastolic CHF, EF preserved at 60% on recent echo.  High-dose Lasix repeated on 06/12/2022 along with Zaroxolyn, volume status appears stable on 06/13/2022.  Elevated troponins Demand ischemia She has no chest pain Echo with stable EF Given AKI/advanced age-do not think she is a good candidate for any further intervention appropriate to manage this medically.  Hyperkalemia.  Lokelma.    Paroxysmal atrial fibrillation Noted on telemetry monitor Rate controlled without the use of any rate control agents Given severity of anemia/advanced age-already on DAPT-do not think she is a good long-term anticoagulation candidate.  This was discussed with patient/daughter/granddaughter on 3/8-all understand the embolic risk and agree to keep her off anticoagulation at this point-she has had prior history of recurrent GI bleeding.  Reviewed prior charts-has had A-fib in the past-deemed not  anticoagulation candidate.  HTN BP stable Continue amlodipine  History of PAD-s/p numerous toe amputations-s/p most recent PCI on 3/1 with angioplasty plus stenting Continue DAPT Continue high intensity statin Appreciate vascular surgery input   Dysphagia Usually on nocturnal tube feeds + liquid diet at home Had aspiration episode 3/7-kept n.p.o.  SLP following Patient understands the risk of resuming oral intake-including life-threatening/life disabling risks-accepts risks-and would like to resume liquids for comfort/pleasure.  Note-tube feeds switched from nocturnal to continuous  Hypothyroidism Continue Synthroid  Peripheral neuropathy Continue Neurontin Cymbalta on hold as cannot be given through PEG tube  Asthma/COPD overlap Not in exacerbation Continue  bronchodilators  Debility/deconditioning PT/OT  Palliative care Confirms DNR Suspect benefits from gentle medical treatment-clearly not a candidate for aggressive care at this point Palliative care consult  has been placed by admitting MD-will await further recommendations.  BMI: Estimated body mass index is 21.1 kg/m as calculated from the following:   Height as of this encounter: '5\' 6"'$  (1.676 m).   Weight as of this encounter: 59.3 kg.   Code status:   Code Status: DNR   DVT Prophylaxis:IV heparin   Family Communication: [id number] grand daughter updated bedside on 06/12/2022 in detail, updated daughter bedside and family member who is a Therapist, sports over daughter's phone on 06/13/22.   Disposition Plan: Status is: Inpatient Remains inpatient appropriate because: Severity of illness   Planned Discharge Destination:Home   Diet: Diet Order             Diet NPO time specified Except for: Other (See Comments)  Diet effective now                    MEDICATIONS: Scheduled Meds:  artificial tears   Both Eyes QHS   aspirin  81 mg Per Tube Daily   atorvastatin  40 mg Per Tube Daily   Chlorhexidine Gluconate Cloth  6 each Topical Daily   clopidogrel  75 mg Per Tube Daily   cycloSPORINE  1 drop Both Eyes BID   diltiazem  90 mg Oral Q8H   ferrous sulfate  300 mg Per Tube Daily   fluticasone  2 spray Each Nare Daily   free water  140 mL Per Tube TID   gabapentin  300 mg Per Tube QHS   insulin aspart  0-15 Units Subcutaneous Q6H   latanoprost  1 drop Both Eyes QHS   levothyroxine  100 mcg Per Tube Q0600   loratadine  10 mg Per Tube QPM   mometasone-formoterol  2 puff Inhalation BID   multivitamin with minerals  1 tablet Per Tube Daily   pantoprazole (PROTONIX) IV  40 mg Intravenous Q12H   polyethylene glycol  17 g Per Tube Daily   polyvinyl alcohol  1 drop Both Eyes QID   sodium zirconium cyclosilicate  10 g Oral TID   Continuous Infusions:   ampicillin-sulbactam (UNASYN) IV 3 g (06/12/22 1955)   feeding supplement (OSMOLITE 1.2 CAL) 1,000 mL (06/12/22 1514)   PRN Meds:.acetaminophen, diltiazem, guaiFENesin, HYDROmorphone (DILAUDID) injection, ipratropium-albuterol, oxyCODONE, polyethylene glycol   I have personally reviewed following labs and imaging studies  LABORATORY DATA:  Recent Labs  Lab 07/02/2022 2245 06/08/22 0630 06/09/22 0842 06/10/22 0335 06/11/22 0330 06/12/22 0219 06/13/22 0332  WBC 12.5*   < > 7.6 6.2 7.1 7.8 12.8*  HGB 8.3*   < > 7.7* 7.5* 7.8* 8.1* 8.0*  HCT 25.3*   < > 23.3* 22.1* 22.2* 24.2* 23.1*  PLT 213   < > 172 171 201 240 289  MCV 101.6*   < > 99.1 97.4 97.4 100.0 97.1  MCH 33.3   < > 32.8 33.0 34.2* 33.5 33.6  MCHC 32.8   < > 33.0 33.9 35.1 33.5 34.6  RDW 15.8*   < > 15.7* 15.5 15.8* 15.6* 15.7*  LYMPHSABS 1.0  --   --   --  0.5* 0.5* 0.6*  MONOABS 0.8  --   --   --  0.7 0.7 1.0  EOSABS 0.0  --   --   --  0.5 0.2 0.2  BASOSABS 0.1  --   --   --  0.0 0.0 0.0   < > = values in this interval not displayed.    Recent Labs  Lab 06/29/2022 2245 06/08/22 0103 06/08/22 0542 06/08/22 0630 06/09/22 0842 06/10/22 0335 06/11/22 0330 06/12/22 0219 06/13/22 0332  NA 131*  --   --  132* 133* 134* 137 135 135  K 5.2*  --   --  4.7 4.6 4.6 5.0 4.9 5.6*  CL 100  --   --  100 101 103 105 101 100  CO2 22  --   --  24 20* 24 21* 22 20*  ANIONGAP 9  --   --  '8 12 7 11 12 15  '$ GLUCOSE 131*  --   --  120* 240* 202* 201* 299* 204*  BUN 75*  --   --  76* 72* 68* 72* 71* 82*  CREATININE 2.54*  --   --  2.58* 2.45* 2.35* 2.09* 1.91* 2.34*  AST 28  --   --  27  --   --   --   --   --   ALT 18  --   --  22  --   --   --   --   --   ALKPHOS 75  --   --  72  --   --   --   --   --   BILITOT 0.7  --   --  0.7  --   --   --   --   --   ALBUMIN 2.8*  --   --  2.6*  --   --   --   --   --   CRP  --   --   --   --   --   --  19.5* 16.1* 16.6*  DDIMER  --   --  2.00*  --   --   --   --   --   --   PROCALCITON   --   --   --   --   --   --  2.73 1.72 1.55  LATICACIDVEN 1.6 1.8  --   --   --   --   --   --   --   INR 1.2  --   --   --   --   --   --   --   --   HGBA1C  --   --   --   --   --   --   --  6.4*  --   BNP  --   --   --   --   --   --  1,824.7* 1,517.1* 1,356.3*  MG  --   --   --  2.1  --   --  2.5* 2.3 2.3  CALCIUM 8.7*  --   --  8.6* 8.4* 8.4* 8.5* 8.6* 8.7*      RADIOLOGY STUDIES/RESULTS: DG Chest Port 1 View  Result Date: 06/13/2022 CLINICAL DATA:  Shortness of breath EXAM: PORTABLE CHEST 1 VIEW COMPARISON:  Yesterday FINDINGS: Opacity at the lung bases, airspace type at the peripheral right base. Left pleural effusion and left lower chest volume loss. Generous heart size accentuated by technique. Artifact from EKG leads. IMPRESSION: Opacity at the bases including infiltrate on the right. Electronically Signed   By: Jorje Guild M.D.   On: 06/13/2022 06:41   DG Chest Port 1 View  Result Date: 06/12/2022 CLINICAL DATA:  [id number] shortness of breath and CHF. EXAM: PORTABLE CHEST 1 VIEW COMPARISON:  Portable chest yesterday at 7:19 a.m. FINDINGS: 6:30 a.m. The heart is enlarged. No vascular congestion is seen. Chronic elevation right hemidiaphragm. The aorta is calcified and mildly tortuous with stable mediastinum. Dense left lower lobe consolidation continues to be seen with small pleural effusions. The remaining lungs are clear. No new or worsening infiltrate is seen. Asymmetric left shoulder DJD is again shown. IMPRESSION: 1. Stable dense left lower lobe consolidation and small pleural effusions. No new or worsening infiltrate is seen. Right base is largely obscured by the elevated hemidiaphragm. 2. Stable cardiomegaly. Electronically Signed   By: Telford Nab M.D.   On: 06/12/2022 06:49     LOS: 5 days   Signature  -    [name] on 06/13/2022 at 9:38 AM   -  To page go to [email or web address]

## 2022-06-13 NOTE — Progress Notes (Signed)
PT Cancellation Note  Patient Details Name: Shannon [name] MRN: [id number] DOB: 14-Mar-1924   Cancelled Treatment:    Reason Eval/Treat Not Completed: Medical issues which prohibited therapy. Per chart review and discussion with RN, pt is on BiPAP, not appropriate for physical therapy at this time. PT will continue to follow up with pt as appropriate/available. Thank you.   Luvenia Heller 06/13/2022, 10:20 AM

## 2022-06-13 NOTE — Progress Notes (Signed)
Tube feeds stopped per MD order. MD thinks patient aspirating tube feeds. Family made aware. Bipap increased to 85% for 02 sat less than 90 %.

## 2022-06-13 NOTE — Progress Notes (Signed)
Daily Progress Note   Patient Name: Shannon [name]       Date: 06/13/2022 DOB: [birth year]  Age: 89 y.o. MRN#: [id number] Attending Physician: Thurnell Lose, MD Primary Care Physician: [name], NP Admit Date: 06/06/2022  Reason for Consultation/Follow-up: Establishing goals of care  Subjective: Received request from Dr. Candiss Norse for PMT follow up - patient declining. CXR's continue to worsen with increasing oxygen needs, now requiring Bipap. Patient has continued to aspirate with both oral intake and likely tube feeds and saliva per notes.  I have reviewed medical records including EPIC notes, MAR, and labs. Received report from primary RN - unable to receive report from primary RN. Discussed case with RT who was exiting room.  Went to visit patient at bedside - daughter/HCPOA/[name], grandaughter/Zonie, son in law, and niece present at bedside. Another son and grandson were added to discussion via speakerphone. Patient was lying in bed intermittently awake, but spends most of my visit sleeping. She is able to participate in simple conversation but quickly falls back asleep after being aroused. She is on BiPap. No signs or non-verbal gestures of pain or discomfort noted. No respiratory distress, increased work of breathing, or secretions noted.   Emotional support provided to family. Allowed family to discuss information they have received thus far about patient's current situation. Validated information and reviewed that patient has worsening CXR and continues to decline, requiring increased oxygen, despite interventions. Expressed concern that respiratory decline is due to continued aspiration from oral intake and tube feeds, which unfortunately is not treatable. Family asked about use of TPN  - education provided and explained that TPN would not be recommended or likely offered. Reviewed that patient is at high risk for continued decline and that if aggressive interventions were pursued, would mean medical team would need to utilize interventions patient has clearly stated she would not want, such as intubation. Family are clear they want to support patient's wishes - continue DNR/DNI.  We talked about transition to comfort measures in house and what that would entail inclusive of medications to control pain, dyspnea, agitation, nausea, and itching. We discussed stopping all unnecessary measures such as blood draws, needle sticks, oxygen, antibiotics, CBGs/insulin, cardiac monitoring, IVF, and frequent vital signs. Education provided that other non-pharmacological interventions would be utilized for holistic support and comfort such  as spiritual support if requested, repositioning, music therapy, offering comfort feeds, and/or therapeutic listening.  There are several family members from out of state that are wanting to get to [place] before we transition to full comfort, once they visit, family are agreeable to transition to comfort at that time - encouraged family that want to see her, to visit sooner than later knowing she is declining - they are booking flights today. Family are hopeful to keep patient stable until they can arrive, though they understand she may decline in the interim. In the event of a decline, they are agreeable for her transition to full comfort care at that time despite if family have arrived. Family also indicate patient's birthday is 2 days from now, they are hopeful she can live to see this birthday.  Natural trajectory at EOL reviewed. Educated why IVF are not recommended at EOL. Reviewed that patient's tube feeds have been stopped due to aspiration concern - discussed keeping tube feeds off as restarting would likely cause her to decline faster. Family are ok with keeping  artificial nutrition/hydration off. Concept of comfort feeds were discussed per family request.  Reviewed lab work and I/Os with family per their request - education provided that decreased urine output would be expected with increased creatinine and decreased oral intake/artifical hydration. Per family request, educated that if needed, foley could be utilized if urinary retention is noted.  Family indicate they wish for me to speak with another granddaughter/Melissa - discussed all information with Melissa as all outlined above. 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 She wants to ensure that if/when patient declines, her life is not unnecessarily prolonged waiting for family to arrive - family in the room are agreeable.   All questions and concerns addressed. Encouraged to call with questions and/or concerns. PMT card provided.  Discussed with RN goals as noted above and family's request to be able to visit for EOL - she is ok with unrestricted visitation order being placed.   Length of Stay: 5  Current Medications: Scheduled Meds:   artificial tears   Both Eyes QHS   aspirin  81 mg Per Tube Daily   atorvastatin  40 mg Per Tube Daily   Chlorhexidine Gluconate Cloth  6 each Topical Daily   clopidogrel  75 mg Per Tube Daily   cycloSPORINE  1 drop Both Eyes BID   diltiazem  90 mg Oral Q8H   ferrous sulfate  300 mg Per Tube Daily   fluticasone  2 spray Each Nare Daily   free water  140 mL Per Tube TID   gabapentin  300 mg Per Tube QHS   insulin aspart  0-15 Units Subcutaneous Q6H   latanoprost  1 drop Both Eyes QHS   levothyroxine  100 mcg Per Tube Q0600   loratadine  10 mg Per Tube QPM   mometasone-formoterol  2 puff Inhalation BID   multivitamin with minerals  1 tablet Per Tube Daily   pantoprazole (PROTONIX) IV  40 mg Intravenous Q12H   polyethylene glycol  17 g Per Tube Daily   polyvinyl alcohol  1 drop Both Eyes QID   sodium zirconium cyclosilicate  10 g  Oral TID    Continuous Infusions:  ampicillin-sulbactam (UNASYN) IV 3 g (06/12/22 1955)   ampicillin-sulbactam (UNASYN) IV 3 g (06/13/22 1008)   feeding supplement (OSMOLITE 1.2 CAL) 1,000 mL (06/12/22 1514)    PRN Meds: acetaminophen, diltiazem, guaiFENesin, HYDROmorphone (DILAUDID) injection, ipratropium-albuterol,  oxyCODONE, polyethylene glycol  Physical Exam Vitals and nursing note reviewed.  Constitutional:      General: She is not in acute distress.    Appearance: She is ill-appearing.  Pulmonary:     Effort: No respiratory distress.  Skin:    General: Skin is warm and dry.  Neurological:     Mental Status: She is lethargic.     Motor: Weakness present.  Psychiatric:        Behavior: Behavior is cooperative.             Vital Signs: BP (!) 134/53   Pulse 92   Temp 98.8 F (37.1 C) (Axillary)   Resp (!) 23   Ht '5\' 6"'$  (1.676 m)   Wt 59.3 kg   SpO2 99%   BMI 21.10 kg/m  SpO2: SpO2: 99 % O2 Device: O2 Device: Bi-PAP O2 Flow Rate: O2 Flow Rate (L/min): (S) 11 L/min  Intake/output summary:  Intake/Output Summary (Last 24 hours) at 06/13/2022 1219 Last data filed at 06/13/2022 0600 Gross per 24 hour  Intake --  Output 1550 ml  Net -1550 ml   LBM: Last BM Date : 06/11/22 Baseline Weight: Weight: 59 kg Most recent weight: Weight: 59.3 kg       Palliative Assessment/Data: PPS 10%      Patient Active Problem List   Diagnosis Date Noted   Sepsis ([place]) 06/08/2022   Pressure injury of buttock, stage 2 ([place]) 08/29/2021   Aortic atherosclerosis ([place]) 01/06/2021   Osteomyelitis of right foot ([place]) 12/21/2020   Medication monitoring encounter 12/21/2020   Diabetic infection of right foot ([place]) 12/21/2020   Diabetic foot infection ([place]) 12/21/2020   Osteomyelitis of left foot ([place]) 12/21/2020   UGI bleed 10/11/2020   Depression, major, single episode, complete remission ([place]) 06/09/2020   History of transmetatarsal amputation of right foot ([place]) 11/14/2019    Chest pain 08/27/2019   Lab test positive for detection of COVID-19 virus 08/27/2019   AMS (altered mental status) 07/07/2019   Varicose veins of right lower extremity with ulcer other part of foot ([place]) 06/11/2019   Jejunostomy tube fell out 03/02/2019   S/P amputation of lesser toe, left (HCC) 02/13/2019   BRBPR (bright red blood per rectum) 01/10/2019   GI bleed 12/25/2018   Bradycardia 11/25/2018   Amputated great toe of right foot ([place]) 05/06/2018   Peripheral vascular disease ([place]) 05/03/2018   Localized, primary osteoarthritis of shoulder region 01/21/2018   PAD (peripheral artery disease) ([place]) 06/27/2017   AKI (acute kidney injury) ([place]) 0000000   Complication of feeding tube ([place]) 02/20/2017   PEG tube malfunction (HCC)    COPD with asthma 10/03/2016   Chronic deep vein thrombosis (DVT) of tibial vein of left lower extremity ([place]) 10/03/2016   Gangrene of toe of left foot ([place]) 09/15/2016   Recurrent aspiration pneumonia (HCC)    Atherosclerosis of native arteries of the extremities with gangrene ([place]) 07/28/2016   Other specified hypothyroidism    CHF (NYHA class IV, ACC/AHA stage D) (HCC)    Shortness of breath    Pressure injury of skin 03/11/2016   PEG (percutaneous endoscopic gastrostomy) status ([place]) 03/10/2016   Encounter for therapeutic drug monitoring 02/16/2016   Warfarin anticoagulation    Chronic seasonal allergic rhinitis    Atrial fibrillation and flutter ([place]) 01/24/2016   Chronic diastolic CHF (congestive heart failure) ([place]) 01/24/2016   Bilateral leg edema 12/29/2015   Hypothyroidism due to medication    Weakness  Pyloric stenosis    Pressure ulcer 09/15/2015   PVD (peripheral vascular disease) (HCC)    Diabetic ulcer of toe of right foot associated with type 2 diabetes mellitus, with necrosis of muscle ([place])    Diverticulitis of large intestine without perforation or abscess without bleeding    Colitis 08/26/2015   Esophageal candidiasis  (HCC)    SOB (shortness of breath)    Encounter for long-term (current) use of other high-risk medications    Dysphagia 07/05/2015   Asthma with allergic rhinitis 06/04/2015   Hypertensive heart disease 03/18/2015   Malnutrition of moderate degree 01/28/2015   Syncope 01/27/2015   Symptomatic bradycardia 01/21/2015   Hypotension due to drugs 01/21/2015   Protein-calorie malnutrition, severe 01/12/2015   Hypertension associated with diabetes ([place]) 01/12/2015   Cerebrovascular disease 07/14/2014   Diabetic polyneuropathy associated with type 2 diabetes mellitus ([place]) 07/14/2014   Hyperlipidemia 07/14/2014   Gastric outlet obstruction 05/21/2014   Unstable gait 04/29/2014   Slurred speech    Gastroparesis    Constipation 02/03/2014   Carotid stenosis 02/03/2014   Demand ischemia 01/21/2014   Gastric bezoar 01/20/2014   Anemia of chronic disease 01/18/2014   CKD (chronic kidney disease) stage 3, GFR 30-59 ml/min ([place]) 01/18/2014   Depression 09/29/2013   ABLA (acute blood loss anemia) 09/29/2013   Essential hypertension, benign 09/22/2013   GERD (gastroesophageal reflux disease) 09/22/2013   Hyperlipidemia associated with type 2 diabetes mellitus ([place]) 09/22/2013   Type 2 diabetes mellitus with chronic kidney disease ([place]) 09/22/2013   Persistent atrial fibrillation 09/22/2013   History of completed stroke 09/22/2013   Diabetic neuropathy ([place]) 09/22/2013   Hypothyroidism 09/22/2013   Chronic gastrojejunal ulcer with perforation ([place]) 09/07/2013   Accelerated hypertension 09/07/2013   Other specified abnormal immunological findings in serum 09/07/2013   Red blood cell antibody positive 09/07/2013    Palliative Care Assessment & Plan   Patient Profile: 89 y.o. female  with past medical history of peripheral artery disease status post stent placement left leg on Friday 06/02/2022, chronic wounds to bilateral feet, COPD with asthma, type 2 diabetes, diabetic polyneuropathy,  essential hypertension, chronic anxiety/depression, hypothyroidism, chronic diastolic CHF, CKD 3B, status post PEG, anemia of chronic disease afib not on AC due to GI bleeds, admitted on 06/20/2022 with left leg pain.    Patient is admitted for sepsis likely from left lower extremity cellulitis, AKI on CKD 3B, acute metabolic encephalopathy, hyperkalemia. PMT has been consulted to assist with goals of care conversation.  Assessment: Active Problems:   Sepsis ([place])   Concern about end of life  Recommendations/Plan: Continue current gentle medical interventions without escalation of care. In the event of further decline, transition to full comfort measures DNR/DNI confirmed Goal is for family to travel from out of state to see patient. After they visit, they are in agreement for her transition to full comfort care. Family are booking flights today. They are also hopeful patient might live to see her birthday on [birth year], but understand she is at high risk of decline and may not make it Unrestricted visitation order placed per current [place] EOL policy No further artifical nutrition/hydration PMT will continue to follow and support holistically  Goals of Care and Additional Recommendations: Limitations on Scope of Treatment: No Artificial Feeding and No Tracheostomy  Code Status:    Code Status Orders  (From admission, onward)           Start     Ordered   06/08/22  0646  Do not attempt resuscitation (DNR)  Continuous       Question Answer Comment  If patient has no pulse and is not breathing Do Not Attempt Resuscitation   If patient has a pulse and/or is breathing: Medical Treatment Goals LIMITED ADDITIONAL INTERVENTIONS: Use medication/IV fluids and cardiac monitoring as indicated; Do not use intubation or mechanical ventilation (DNI), also provide comfort medications.  Transfer to Progressive/Stepdown as indicated, avoid Intensive Care.   Consent: Discussion documented in  EHR or advanced directives reviewed      06/08/22 0645           Code Status History     Date Active Date Inactive Code Status Order ID Comments User Context   06/02/2022 1015 06/02/2022 1733 Full Code [id number]  Cherre Robins, MD Inpatient   08/29/2021 0705 08/30/2021 2342 DNR [id number]  Shela Leff, MD Inpatient   02/14/2021 2019 02/16/2021 2350 DNR [id number]  Etta Quill, DO ED   12/21/2020 2323 12/31/2020 1959 DNR [id number]  Etta Quill, DO ED   12/21/2020 2319 12/21/2020 2323 DNR [id number]  Etta Quill, DO ED   10/20/2020 0955 10/22/2020 2259 Full Code [id number]  Cherre Robins, MD Inpatient   10/11/2020 0234 10/14/2020 2103 DNR [id number]  Neena Rhymes, MD ED   04/30/2020 0409 05/02/2020 2053 DNR [id number]  Elwyn Reach, MD Inpatient   11/14/2019 1716 11/15/2019 1710 Full Code [id number]  Edrick Kins, [place] Inpatient   08/27/2019 1956 08/28/2019 1903 DNR [id number]  Lenore Cordia, MD ED   08/27/2019 1807 08/27/2019 1956 DNR [id number]  Fredia Sorrow, MD ED   07/18/2019 1339 08/15/2019 0412 DNR [id number]  [name], NP Outpatient   07/07/2019 0541 07/08/2019 2307 DNR [id number]  Rise Patience, MD Inpatient   05/18/2019 2309 05/19/2019 2347 DNR [id number]  Shela Leff, MD ED   05/18/2019 2225 05/18/2019 2309 Full Code [id number]  Shela Leff, MD ED   03/02/2019 0229 03/03/2019 2125 Full Code [id number]  Quintella Baton, MD ED   12/25/2018 1853 12/26/2018 1942 Partial Code [id number]  Donne Hazel, MD ED   12/25/2018 1843 12/25/2018 1853 Full Code [id number]  Donne Hazel, MD ED   11/24/2018 2352 11/26/2018 1821 DNR [id number]  Lenore Cordia, MD Inpatient   05/03/2018 1535 05/09/2018 1920 Full Code [id number]  Merton Border, MD Inpatient   06/27/2017 1507 06/29/2017 1553 Full Code [id number]  Waynetta Sandy, MD Inpatient   02/20/2017 0434 02/23/2017 1956 DNR [id number]  Vianne Bulls, MD ED   09/15/2016 0035 09/27/2016 1742 DNR [id number]  Etta Quill, DO ED   08/12/2016 1706 08/18/2016 2132 DNR [id number]  Theodis Blaze, MD Inpatient   07/28/2016 2325 08/01/2016 2110 DNR [id number]  Lily Kocher, MD ED   06/08/2016 1752 06/16/2016 1805 DNR [id number]  Omar Person, NP ED   05/22/2016 0456 05/30/2016 1738 DNR [id number]  Norval Morton, MD Inpatient   05/09/2016 1049 05/17/2016 1728 DNR [id number]  Rosita Fire, MD Inpatient   05/08/2016 1759 05/09/2016 1049 Full Code [id number]  Rosita Fire, MD ED   03/02/2016 1755 03/14/2016 2016 Partial Code [id number]  Erma Heritage, [place] Inpatient   02/08/2016 0217 02/12/2016 1746 Partial Code [id number]  Norval Morton, MD ED   01/24/2016 1621 02/02/2016 2056 Partial Code [id number]  Charlie Pitter, PA-C Inpatient   01/24/2016 1611 01/24/2016 1621 Partial Code [id number]  Imogene Burn, PA-C Inpatient   01/24/2016 1611 01/24/2016 1611  Partial Code [id number]  Murrell Converse Inpatient   11/06/2015 2300 11/11/2015 2201 Partial Code [id number]  Edwin Dada, MD Inpatient   09/12/2015 0411 09/30/2015 1719 Partial Code [id number]  Vianne Bulls, MD Inpatient   08/26/2015 1918 09/05/2015 1824 Partial Code [id number]  Willia Craze, NP Inpatient   08/26/2015 1527 08/26/2015 1918 Full Code [id number]  Willia Craze, NP ED   08/02/2015 1603 08/05/2015 1731 Partial Code [id number]  Erlene Quan, PA-C ED   07/06/2015 1144 07/11/2015 1636 Partial Code [id number]  Minor, Grace Bushy, NP Inpatient   07/05/2015 2118 07/06/2015 1144 Full Code [id number]  Etta Quill, DO ED   01/28/2015 0047 01/29/2015 1732 DNR [id number]  Lavina Hamman, MD Inpatient   01/14/2015 1409 01/27/2015 2148 DNR [id number]  Drue Novel, NP Inpatient   01/10/2015 0501 01/14/2015 1409 Full Code [id number]  Etta Quill, DO ED   05/21/2014 2324 05/30/2014 1829 DNR [id number]  Etta Quill, DO ED   03/27/2014 1733 03/30/2014 1826 DNR [id number]  Phillips Climes, MD Inpatient   03/13/2014 0226 03/26/2014 1631 Full Code  [id number]  Rise Patience, MD Inpatient   01/18/2014 0150 01/22/2014 1708 Full Code [id number]  Jani Gravel, MD Inpatient   12/23/2013 2311 12/26/2013 2218 Full Code [id number]  Phillips Grout, MD Inpatient       Prognosis:  < 2 weeks  Discharge Planning: To Be Determined  Care plan was discussed with primary RN, Dr. Candiss Norse, patient's family  Thank you for allowing the Palliative Medicine Team to assist in the care of this patient.   Total Time 70 minutes Prolonged Time Billed  yes       Greater than 50%  of this time was spent counseling and coordinating care related to the above assessment and plan.  Lin Landsman, NP  Please contact Palliative Medicine Team phone at [phone] for questions and concerns.   *Portions of this note are a verbal dictation therefore any spelling and/or grammatical errors are due to the "Jud One" system interpretation.

## 2022-06-14 DIAGNOSIS — Z515 Encounter for palliative care: Secondary | ICD-10-CM | POA: Diagnosis not present

## 2022-06-14 DIAGNOSIS — N179 Acute kidney failure, unspecified: Secondary | ICD-10-CM | POA: Diagnosis not present

## 2022-06-14 DIAGNOSIS — Z7189 Other specified counseling: Secondary | ICD-10-CM | POA: Diagnosis not present

## 2022-06-14 DIAGNOSIS — A419 Sepsis, unspecified organism: Secondary | ICD-10-CM | POA: Diagnosis not present

## 2022-06-14 DIAGNOSIS — R638 Other symptoms and signs concerning food and fluid intake: Secondary | ICD-10-CM

## 2022-06-14 LAB — CBC WITH DIFFERENTIAL/PLATELET
Abs Immature Granulocytes: 0.15 10*3/uL — ABNORMAL HIGH (ref 0.00–0.07)
Basophils Absolute: 0.1 10*3/uL (ref 0.0–0.1)
Basophils Relative: 1 %
Eosinophils Absolute: 0.1 10*3/uL (ref 0.0–0.5)
Eosinophils Relative: 1 %
HCT: 22.7 % — ABNORMAL LOW (ref 36.0–46.0)
Hemoglobin: 8.1 g/dL — ABNORMAL LOW (ref 12.0–15.0)
Immature Granulocytes: 1 %
Lymphocytes Relative: 3 %
Lymphs Abs: 0.4 10*3/uL — ABNORMAL LOW (ref 0.7–4.0)
MCH: 33.5 pg (ref 26.0–34.0)
MCHC: 35.7 g/dL (ref 30.0–36.0)
MCV: 93.8 fL (ref 80.0–100.0)
Monocytes Absolute: 0.5 10*3/uL (ref 0.1–1.0)
Monocytes Relative: 4 %
Neutro Abs: 11.1 10*3/uL — ABNORMAL HIGH (ref 1.7–7.7)
Neutrophils Relative %: 90 %
Platelets: 283 10*3/uL (ref 150–400)
RBC: 2.42 MIL/uL — ABNORMAL LOW (ref 3.87–5.11)
RDW: 15.1 % (ref 11.5–15.5)
WBC: 12.4 10*3/uL — ABNORMAL HIGH (ref 4.0–10.5)
nRBC: 0.2 % (ref 0.0–0.2)

## 2022-06-14 LAB — BASIC METABOLIC PANEL
Anion gap: 15 (ref 5–15)
BUN: 99 mg/dL — ABNORMAL HIGH (ref 8–23)
CO2: 23 mmol/L (ref 22–32)
Calcium: 8.4 mg/dL — ABNORMAL LOW (ref 8.9–10.3)
Chloride: 96 mmol/L — ABNORMAL LOW (ref 98–111)
Creatinine, Ser: 3.12 mg/dL — ABNORMAL HIGH (ref 0.44–1.00)
GFR, Estimated: 13 mL/min — ABNORMAL LOW (ref >60)
Glucose, Bld: 172 mg/dL — ABNORMAL HIGH (ref 70–99)
Potassium: 5.3 mmol/L — ABNORMAL HIGH (ref 3.5–5.1)
Sodium: 134 mmol/L — ABNORMAL LOW (ref 135–145)

## 2022-06-14 LAB — GLUCOSE, CAPILLARY
Glucose-Capillary: 178 mg/dL — ABNORMAL HIGH (ref 70–99)
Glucose-Capillary: 189 mg/dL — ABNORMAL HIGH (ref 70–99)
Glucose-Capillary: 201 mg/dL — ABNORMAL HIGH (ref 70–99)

## 2022-06-14 LAB — PROCALCITONIN: Procalcitonin: 3.04 ng/mL

## 2022-06-14 LAB — MAGNESIUM: Magnesium: 2.4 mg/dL (ref 1.7–2.4)

## 2022-06-14 LAB — C-REACTIVE PROTEIN: CRP: 24.1 mg/dL — ABNORMAL HIGH (ref <1.0)

## 2022-06-14 LAB — BRAIN NATRIURETIC PEPTIDE: B Natriuretic Peptide: 2706.2 pg/mL — ABNORMAL HIGH (ref 0.0–100.0)

## 2022-06-14 MED ORDER — SODIUM ZIRCONIUM CYCLOSILICATE 10 G PO PACK
10.0000 g | PACK | Freq: Three times a day (TID) | ORAL | Status: AC
  Administered 2022-06-14 (×3): 10 g
  Filled 2022-06-14 (×3): qty 1

## 2022-06-14 NOTE — Progress Notes (Signed)
PROGRESS NOTE        PATIENT DETAILS Name: Shannon [name] Age: 89 y.o. Sex: female Date of Birth: [birth year] Admit Date: 06/12/2022 Admitting Physician [name], DO [id number], [name], NP  Brief Summary: Patient is a 89 y.o.  female PAD-s/p recent angioplasty/PCI on 3/1, asthma/COPD overlap, DM-2, HTN, chronic HFpEF, CKD stage IIIb, dysphagia-s/p PEG tube placement-on nocturnal feedings-who presented with weakness/confusion, fever, LLE swelling/pain-she was found to have probable LLE cellulitis with AKI-and subsequently admitted to the hospitalist service.  Significant events: 3/6>> admit to [facility] 3/7>> episode of aspiration-requiring deep nasotracheal suctioning/oxygen.  Kept NPO. 3/8>> A-fib on telemetry monitor  Significant studies: 3/6>> CXR: No PNA 3/7>> CT abdomen/pelvis: No acute findings. 3/7>> echo: EF [id number], grade 2 diastolic dysfunction. 3/7>> bilateral lower extremity Doppler: No DVT. 3/8>> CXR: Right upper lobe atelectasis.  Significant microbiology data: 3/6>> COVID/influenza/RSV PCR: Negative 3/6>> blood culture: No growth  Procedures: None  Consults: [facility] Palliative care  Subjective:  Patient in bed in BiPAP resting comfortably, somnolent, arousable but minimally responsive.   Objective: Vitals: Blood pressure (!) 147/53, pulse 82, temperature 99 F (37.2 C), temperature source Axillary, resp. rate (!) 24, height '5\' 6"'$  (1.676 m), weight 59.3 kg, SpO2 94 %.   Exam:  Somnolent wearing BiPAP, minimally responsive, [place].AT,PERRAL Supple Neck, No JVD,   Symmetrical Chest wall movement, Good air movement bilaterally, +ve rales RRR,No Gallops, Rubs or new Murmurs,  +ve B.Sounds, Abd Soft, No tenderness,  PEG 1+ leg edema    Assessment/Plan: Sepsis likely from LLE cellulitis Sepsis physiology improved Minimal erythema/edema today-better than 3/7 Dopplers negative for DVT  Will discontinue IV vancomycin-as cellulitis is  minimal and nonpurulent.  Should be covered by IV Unasyn (for aspiration PNA)  Acute hypoxic respiratory failure due to aspiration pneumonia, ongoing aspiration of saliva and likely gastric contents along with acute on chronic CHF. Had aspiration episode on 3/7-briefly required NRB-nasotracheal suctioning-was subsequently transition to 3-4 L of oxygen.  Improving-have transitioned her to room air today Appreciate SLP evaluation 3/9-okay to allow sips.  Per patient-she was on a liquid diet at home (gets nocturnal tube feeds).  She understands the risk of aspiration-and would want to eat liquids if offered for comfort/pleasure.   On IV Unasyn-5 days total-started 3/7 Unfortunately continued aspiration of both oral intake and likely tube feeds and saliva, much worse night of 06/11/2022, tube feeding held for a few hours thereafter at a lower rate, had long discussion with patient and patient's daughter along with granddaughter bedside on her poor prognosis on 06/12/2022.  They will discuss amongst themselves and let us know about long-term plan on 06/13/2022.  Chest x-ray continues to get worse on 06/13/2022 Unasyn added, requiring BiPAP almost every night, again prognosis appears extremely poor.    He was seen by palliative care after detailed discussions with family it was decided that patient will be continued on gentle medical treatment directed towards keeping her comfortable, continue DNR, family members visiting from outside.  She appears terminal and family is at terms with it.  If at any point she becomes uncomfortable then full comfort measures will be initiated.  AKI on CKD stage IIIb with baseline creatinine close to 2 Suspect hemodynamically mediated in the setting of sepsis but did have recent angiography done so could be some element of contrast-induced nephropathy No hydronephrosis on CT abdomen  Creatinine now downtrending Continue supportive care Avoid nephrotoxic agents  Acute  encephalopathy Apparently was weak/confused when she first presented-seems to have improved rapidly Suspect this could have been from AKI Supportive care  Normocytic anemia No overt evidence of blood loss-no BM for the past several days No history of hematochezia-some intermittent dark stools but patient attributes this to iron therapy Hb levels fluctuating but relatively stable since yesterday Await FOBT Continue PPI twice daily Follow and transfuse accordingly.    Acute on chronic diastolic CHF, EF preserved at 60% on recent echo.  High-dose Lasix repeated on 06/12/2022 along with Zaroxolyn, volume status appears stable on 06/13/2022.  Elevated troponins Demand ischemia She has no chest pain Echo with stable EF Given AKI/advanced age-do not think she is a good candidate for any further intervention appropriate to manage this medically.  Hyperkalemia.  Lokelma PEG tube.    Paroxysmal atrial fibrillation Noted on telemetry monitor Rate controlled without the use of any rate control agents Given severity of anemia/advanced age-already on DAPT-do not think she is a good long-term anticoagulation candidate.  This was discussed with patient/daughter/granddaughter on 3/8-all understand the embolic risk and agree to keep her off anticoagulation at this point-she has had prior history of recurrent GI bleeding.  Reviewed prior charts-has had A-fib in the past-deemed not  anticoagulation candidate.  HTN BP stable Continue amlodipine  History of PAD-s/p numerous toe amputations-s/p most recent PCI on 3/1 with angioplasty plus stenting Continue DAPT Continue high intensity statin Appreciate vascular surgery input   Dysphagia Usually on nocturnal tube feeds + liquid diet at home Had aspiration episode 3/7-kept n.p.o.  SLP following Patient understands the risk of resuming oral intake-including life-threatening/life disabling risks-accepts risks-and would like to resume liquids for  comfort/pleasure.  Note-tube feeds switched from nocturnal to continuous  Hypothyroidism Continue Synthroid  Peripheral neuropathy Continue Neurontin Cymbalta on hold as cannot be given through PEG tube  Asthma/COPD overlap Not in exacerbation Continue bronchodilators  Debility/deconditioning PT/OT  Palliative care Confirms DNR Suspect benefits from gentle medical treatment-clearly not a candidate for aggressive care at this point Palliative care consult  has been placed by admitting MD-will await further recommendations.  BMI: Estimated body mass index is 21.1 kg/m as calculated from the following:   Height as of this encounter: '5\' 6"'$  (1.676 m).   Weight as of this encounter: 59.3 kg.   Code status:   Code Status: DNR   DVT Prophylaxis:IV heparin   Family Communication: [id number] grand daughter updated bedside on 06/12/2022 in detail, updated daughter bedside and family member who is a Therapist, sports over daughter's phone on 06/13/22.  Female family member at bedside updated on 06/14/2022.   Disposition Plan: Status is: Inpatient Remains inpatient appropriate because: Severity of illness   Planned Discharge Destination:Home   Diet: Diet Order             Diet NPO time specified Except for: Other (See Comments)  Diet effective now                    MEDICATIONS: Scheduled Meds:  artificial tears   Both Eyes QHS   aspirin  81 mg Per Tube Daily   atorvastatin  40 mg Per Tube Daily   Chlorhexidine Gluconate Cloth  6 each Topical Daily   clopidogrel  75 mg Per Tube Daily   cycloSPORINE  1 drop Both Eyes BID   diltiazem  90 mg Oral Q8H   ferrous sulfate  300 mg Per Tube Daily  fluticasone  2 spray Each Nare Daily   gabapentin  300 mg Per Tube QHS   latanoprost  1 drop Both Eyes QHS   levothyroxine  100 mcg Per Tube Q0600   loratadine  10 mg Per Tube QPM   mometasone-formoterol  2 puff Inhalation BID   multivitamin with minerals  1 tablet Per  Tube Daily   pantoprazole (PROTONIX) IV  40 mg Intravenous Q12H   polyethylene glycol  17 g Per Tube Daily   polyvinyl alcohol  1 drop Both Eyes QID   sodium zirconium cyclosilicate  10 g Per Tube TID   Continuous Infusions:  ampicillin-sulbactam (UNASYN) IV 3 g (06/13/22 2328)   dextrose 35 mL/hr at 06/13/22 1505   PRN Meds:.acetaminophen, diltiazem, guaiFENesin, HYDROmorphone (DILAUDID) injection, ipratropium-albuterol, oxyCODONE, polyethylene glycol   I have personally reviewed following labs and imaging studies  LABORATORY DATA:  Recent Labs  Lab 06/17/2022 2245 06/08/22 0630 06/10/22 0335 06/11/22 0330 06/12/22 0219 06/13/22 0332 06/14/22 0316  WBC 12.5*   < > 6.2 7.1 7.8 12.8* 12.4*  HGB 8.3*   < > 7.5* 7.8* 8.1* 8.0* 8.1*  HCT 25.3*   < > 22.1* 22.2* 24.2* 23.1* 22.7*  PLT 213   < > 171 201 240 289 283  MCV 101.6*   < > 97.4 97.4 100.0 97.1 93.8  MCH 33.3   < > 33.0 34.2* 33.5 33.6 33.5  MCHC 32.8   < > 33.9 35.1 33.5 34.6 35.7  RDW 15.8*   < > 15.5 15.8* 15.6* 15.7* 15.1  LYMPHSABS 1.0  --   --  0.5* 0.5* 0.6* 0.4*  MONOABS 0.8  --   --  0.7 0.7 1.0 0.5  EOSABS 0.0  --   --  0.5 0.2 0.2 0.1  BASOSABS 0.1  --   --  0.0 0.0 0.0 0.1   < > = values in this interval not displayed.    Recent Labs  Lab 06/17/2022 2245 06/08/22 0103 06/08/22 0542 06/08/22 0630 06/09/22 [id number] 06/10/22 0335 06/11/22 0330 06/12/22 0219 06/13/22 0332 06/14/22 0316  NA 131*  --   --  132*   < > 134* 137 135 135 134*  K 5.2*  --   --  4.7   < > 4.6 5.0 4.9 5.6* 5.3*  CL 100  --   --  100   < > 103 105 101 100 96*  CO2 22  --   --  24   < > 24 21* 22 20* 23  ANIONGAP 9  --   --  8   < > '7 11 12 15 15  '$ GLUCOSE 131*  --   --  120*   < > 202* 201* 299* 204* 172*  BUN 75*  --   --  76*   < > 68* 72* 71* 82* 99*  CREATININE 2.54*  --   --  2.58*   < > 2.35* 2.09* 1.91* 2.34* 3.12*  AST 28  --   --  27  --   --   --   --   --   --   ALT 18  --   --  22  --   --   --   --   --   --    ALKPHOS 75  --   --  72  --   --   --   --   --   --   BILITOT 0.7  --   --  0.7  --   --   --   --   --   --   ALBUMIN 2.8*  --   --  2.6*  --   --   --   --   --   --   CRP  --   --   --   --   --   --  19.5* 16.1* 16.6* 24.1*  DDIMER  --   --  2.00*  --   --   --   --   --   --   --   PROCALCITON  --   --   --   --   --   --  2.73 1.72 1.55 3.04  LATICACIDVEN 1.6 1.8  --   --   --   --   --   --   --   --   INR 1.2  --   --   --   --   --   --   --   --   --   HGBA1C  --   --   --   --   --   --   --  6.4*  --   --   BNP  --   --   --   --   --   --  1,824.7* 1,517.1* 1,356.3* 2,706.2*  MG  --   --   --  2.1  --   --  2.5* 2.3 2.3 2.4  CALCIUM 8.7*  --   --  8.6*   < > 8.4* 8.5* 8.6* 8.7* 8.4*   < > = values in this interval not displayed.      RADIOLOGY STUDIES/RESULTS: DG Chest Port 1 View  Result Date: 06/13/2022 CLINICAL DATA:  Shortness of breath EXAM: PORTABLE CHEST 1 VIEW COMPARISON:  Yesterday FINDINGS: Opacity at the lung bases, airspace type at the peripheral right base. Left pleural effusion and left lower chest volume loss. Generous heart size accentuated by technique. Artifact from EKG leads. IMPRESSION: Opacity at the bases including infiltrate on the right. Electronically Signed   By: Jorje Guild M.D.   On: 06/13/2022 06:41     LOS: 6 days   Signature  -    [name] on 06/14/2022 at 9:25 AM   -  To page go to [email or web address]

## 2022-06-14 NOTE — Progress Notes (Signed)
OT Cancellation Note  Patient Details Name: Shannon [name] MRN: [id number] DOB: [birth year]   Cancelled Treatment:    Reason Eval/Treat Not Completed:  Pt is transitioning to comfort care, signing off.  [name] 06/14/2022, 8:19 AM Cleta Alberts, OTR/L Acute Rehabilitation Services Office: [phone]

## 2022-06-14 NOTE — Progress Notes (Signed)
PT Cancellation Note  Patient Details Name: Shannon [name] MRN: [id number] DOB: 17-Jan-1924   Cancelled Treatment:    Reason Eval/Treat Not Completed: Other (comment) Per chart, pt experiencing medical decline. Spoke with MD who states to d/c PT orders at this time.  Lavone Nian, PT, DPT 06/14/22, 8:18 AM   [name] 06/14/2022, 8:18 AM

## 2022-06-14 NOTE — Progress Notes (Addendum)
Daily Progress Note   Patient Name: Shannon [name]       Date: 06/14/2022 DOB: [birth year]  Age: 89 y.o. MRN#: [id number] Attending Physician: Thurnell Lose, MD Primary Care Physician: [name], NP Admit Date: 06/24/2022  Reason for Consultation/Follow-up: Establishing goals of care  Subjective: I have reviewed medical records including EPIC notes, MAR, and labs. Received report from primary RN - no acute concerns. No major changes since yesterday.  Patient lethargic.  Went to visit patient at bedside -daughter/[name] and grandson/Hugh present.  Patient is lying in bed -she does not wake to voice/gentle touch. No signs or non-verbal gestures of pain or discomfort noted. No respiratory distress, increased work of breathing, or secretions noted.  BiPAP still in use.  Emotional support provided to family.  Camila Li is one of the family members that has traveled from out of state.  There are several other family members that should be arriving today/this evening.  Family continue to be hopeful that patient can live long enough for family to visit and to see her birthday, which is tomorrow.  Family question if patient would be able to taste her ice cream cake tomorrow.  Discussed this in context of lethargy, BiPAP use vs transition to comfort, as well as comfort feeds.   Family understand she remains high risk for decline - low threshold for transition to full comfort care in this instance.  Education provided per their request on how symptoms would be managed once BiPAP is removed.  Discussed a opioid drip would likely be necessary to offer continuous support and ensure she was not struggling to breathe once BiPap is removed - they expressed understanding.  All questions and concerns addressed.  Encouraged to call with questions and/or concerns. PMT card provided.  **ADDENDUM** 11:15 AM Notified family called PMT phone with questions.  Called [name] - they are wondering if 6 people could be allowed in patient's room. Explained current policy but would also reach out to nursing staff to discuss.  Discussed with primary RN - they will allow additional visitors. Charge RN will call and notify front desk staff.  Length of Stay: 6  Current Medications: Scheduled Meds:   artificial tears   Both Eyes QHS   aspirin  81 mg Per Tube Daily   atorvastatin  40 mg Per Tube  Daily   Chlorhexidine Gluconate Cloth  6 each Topical Daily   clopidogrel  75 mg Per Tube Daily   cycloSPORINE  1 drop Both Eyes BID   diltiazem  90 mg Oral Q8H   ferrous sulfate  300 mg Per Tube Daily   fluticasone  2 spray Each Nare Daily   gabapentin  300 mg Per Tube QHS   latanoprost  1 drop Both Eyes QHS   levothyroxine  100 mcg Per Tube Q0600   loratadine  10 mg Per Tube QPM   mometasone-formoterol  2 puff Inhalation BID   multivitamin with minerals  1 tablet Per Tube Daily   pantoprazole (PROTONIX) IV  40 mg Intravenous Q12H   polyethylene glycol  17 g Per Tube Daily   polyvinyl alcohol  1 drop Both Eyes QID   sodium zirconium cyclosilicate  10 g Per Tube TID    Continuous Infusions:  ampicillin-sulbactam (UNASYN) IV 3 g (06/14/22 1016)   dextrose 35 mL/hr at 06/13/22 1505    PRN Meds: acetaminophen, diltiazem, guaiFENesin, HYDROmorphone (DILAUDID) injection, ipratropium-albuterol, oxyCODONE, polyethylene glycol  Physical Exam Vitals and nursing note reviewed.  Constitutional:      General: She is not in acute distress.    Appearance: She is ill-appearing.  Pulmonary:     Effort: No respiratory distress.  Skin:    General: Skin is warm and dry.  Neurological:     Mental Status: She is lethargic.     Motor: Weakness present.  Psychiatric:        Behavior: Behavior is cooperative.              Vital Signs: BP (!) 151/69   Pulse 93   Temp 99 F (37.2 C) (Axillary)   Resp (!) 22   Ht '5\' 6"'$  (1.676 m)   Wt 59.3 kg   SpO2 93%   BMI 21.10 kg/m  SpO2: SpO2: 93 % O2 Device: O2 Device: Bi-PAP O2 Flow Rate: O2 Flow Rate (L/min): (S) 11 L/min  Intake/output summary:  Intake/Output Summary (Last 24 hours) at 06/14/2022 1019 Last data filed at 06/14/2022 1000 Gross per 24 hour  Intake 642.03 ml  Output 350 ml  Net 292.03 ml   LBM: Last BM Date : 06/11/22 Baseline Weight: Weight: 59 kg Most recent weight: Weight: 59.3 kg       Palliative Assessment/Data: PPS 10%      Patient Active Problem List   Diagnosis Date Noted   Sepsis ([place]) 06/08/2022   Pressure injury of buttock, stage 2 ([place]) 08/29/2021   Aortic atherosclerosis ([place]) 01/06/2021   Osteomyelitis of right foot ([place]) 12/21/2020   Medication monitoring encounter 12/21/2020   Diabetic infection of right foot ([place]) 12/21/2020   Diabetic foot infection ([place]) 12/21/2020   Osteomyelitis of left foot ([place]) 12/21/2020   UGI bleed 10/11/2020   Depression, major, single episode, complete remission ([place]) 06/09/2020   History of transmetatarsal amputation of right foot ([place]) 11/14/2019   Chest pain 08/27/2019   Lab test positive for detection of COVID-19 virus 08/27/2019   AMS (altered mental status) 07/07/2019   Varicose veins of right lower extremity with ulcer other part of foot ([place]) 06/11/2019   Jejunostomy tube fell out 03/02/2019   S/P amputation of lesser toe, left ([place]) 02/13/2019   BRBPR (bright red blood per rectum) 01/10/2019   GI bleed 12/25/2018   Bradycardia 11/25/2018   Amputated great toe of right foot ([place]) 05/06/2018   Peripheral vascular disease ([place]) 05/03/2018  Localized, primary osteoarthritis of shoulder region 01/21/2018   PAD (peripheral artery disease) ([place]) 06/27/2017   AKI (acute kidney injury) ([place]) 0000000   Complication of feeding tube ([place]) 02/20/2017   PEG tube malfunction  ([place])    COPD with asthma 10/03/2016   Chronic deep vein thrombosis (DVT) of tibial vein of left lower extremity ([place]) 10/03/2016   Gangrene of toe of left foot ([place]) 09/15/2016   Recurrent aspiration pneumonia ([place])    Atherosclerosis of native arteries of the extremities with gangrene ([place]) 07/28/2016   Other specified hypothyroidism    CHF (NYHA class IV, ACC/AHA stage D) ([place])    Shortness of breath    Pressure injury of skin 03/11/2016   PEG (percutaneous endoscopic gastrostomy) status ([place]) 03/10/2016   Encounter for therapeutic drug monitoring 02/16/2016   Warfarin anticoagulation    Chronic seasonal allergic rhinitis    Atrial fibrillation and flutter ([place]) 01/24/2016   Chronic diastolic CHF (congestive heart failure) ([place]) 01/24/2016   Bilateral leg edema 12/29/2015   Hypothyroidism due to medication    Weakness    Pyloric stenosis    Pressure ulcer 09/15/2015   PVD (peripheral vascular disease) ([place])    Diabetic ulcer of toe of right foot associated with type 2 diabetes mellitus, with necrosis of muscle ([place])    Diverticulitis of large intestine without perforation or abscess without bleeding    Colitis 08/26/2015   Esophageal candidiasis (HCC)    SOB (shortness of breath)    Encounter for long-term (current) use of other high-risk medications    Dysphagia 07/05/2015   Asthma with allergic rhinitis 06/04/2015   Hypertensive heart disease 03/18/2015   Malnutrition of moderate degree 01/28/2015   Syncope 01/27/2015   Symptomatic bradycardia 01/21/2015   Hypotension due to drugs 01/21/2015   Protein-calorie malnutrition, severe 01/12/2015   Hypertension associated with diabetes ([place]) 01/12/2015   Cerebrovascular disease 07/14/2014   Diabetic polyneuropathy associated with type 2 diabetes mellitus ([place]) 07/14/2014   Hyperlipidemia 07/14/2014   Gastric outlet obstruction 05/21/2014   Unstable gait 04/29/2014   Slurred speech    Gastroparesis    Constipation 02/03/2014    Carotid stenosis 02/03/2014   Demand ischemia 01/21/2014   Gastric bezoar 01/20/2014   Anemia of chronic disease 01/18/2014   CKD (chronic kidney disease) stage 3, GFR 30-59 ml/min ([place]) 01/18/2014   Depression 09/29/2013   ABLA (acute blood loss anemia) 09/29/2013   Essential hypertension, benign 09/22/2013   GERD (gastroesophageal reflux disease) 09/22/2013   Hyperlipidemia associated with type 2 diabetes mellitus ([place]) 09/22/2013   Type 2 diabetes mellitus with chronic kidney disease ([place]) 09/22/2013   Persistent atrial fibrillation 09/22/2013   History of completed stroke 09/22/2013   Diabetic neuropathy ([place]) 09/22/2013   Hypothyroidism 09/22/2013   Chronic gastrojejunal ulcer with perforation ([place]) 09/07/2013   Accelerated hypertension 09/07/2013   Other specified abnormal immunological findings in serum 09/07/2013   Red blood cell antibody positive 09/07/2013    Palliative Care Assessment & Plan   Patient Profile: 89 y.o. female  with past medical history of peripheral artery disease status post stent placement left leg on Friday 06/02/2022, chronic wounds to bilateral feet, COPD with asthma, type 2 diabetes, diabetic polyneuropathy, essential hypertension, chronic anxiety/depression, hypothyroidism, chronic diastolic CHF, CKD 3B, status post PEG, anemia of chronic disease afib not on AC due to GI bleeds, admitted on 06/05/2022 with left leg pain.    Patient is admitted for sepsis likely from left lower extremity cellulitis, AKI on CKD 3B,  acute metabolic encephalopathy, hyperkalemia. PMT has been consulted to assist with goals of care conversation.  Assessment: Active Problems:   Sepsis ([place])   Concern about end of life  Recommendations/Plan: Continue current gentle medical interventions without escalation of care with goal to keep her stable until family can visit.  However, in the event of further decline, low threshold for transition to full comfort measures Continue  DNR/DNI as previously document Most family should be arriving today/this evening to visit with patient.  After family visit, they are in agreement for patient's transition to full comfort care.  They are also hopeful patient might live to see her birthday tomorrow [birth year] but understands she is at high risk of decline and may not make it No further artificial nutrition/hydration Continue unrestricted visitation for EOL PMT will continue to follow and support holistically   Goals of Care and Additional Recommendations: Limitations on Scope of Treatment: Minimize Medications, No Artificial Feeding, and No Tracheostomy  Code Status:    Code Status Orders  (From admission, onward)           Start     Ordered   06/08/22 0646  Do not attempt resuscitation (DNR)  Continuous       Question Answer Comment  If patient has no pulse and is not breathing Do Not Attempt Resuscitation   If patient has a pulse and/or is breathing: Medical Treatment Goals LIMITED ADDITIONAL INTERVENTIONS: Use medication/IV fluids and cardiac monitoring as indicated; Do not use intubation or mechanical ventilation (DNI), also provide comfort medications.  Transfer to Progressive/Stepdown as indicated, avoid Intensive Care.   Consent: Discussion documented in EHR or advanced directives reviewed      06/08/22 0645           Code Status History     Date Active Date Inactive Code Status Order ID Comments User Context   06/02/2022 1015 06/02/2022 1733 Full Code [id number]  Cherre Robins, MD Inpatient   08/29/2021 0705 08/30/2021 2342 DNR [id number]  Shela Leff, MD Inpatient   02/14/2021 2019 02/16/2021 2350 DNR [id number]  Etta Quill, DO ED   12/21/2020 2323 12/31/2020 1959 DNR [id number]  Etta Quill, DO ED   12/21/2020 2319 12/21/2020 2323 DNR [id number]  Etta Quill, DO ED   10/20/2020 0955 10/22/2020 2259 Full Code [id number]  Cherre Robins, MD Inpatient   10/11/2020 0234 10/14/2020 2103 DNR [id number]   Neena Rhymes, MD ED   04/30/2020 0409 05/02/2020 2053 DNR [id number]  Elwyn Reach, MD Inpatient   11/14/2019 1716 11/15/2019 1710 Full Code [id number]  Edrick Kins, [place] Inpatient   08/27/2019 1956 08/28/2019 1903 DNR [id number]  Lenore Cordia, MD ED   08/27/2019 1807 08/27/2019 1956 DNR [id number]  Fredia Sorrow, MD ED   07/18/2019 1339 08/15/2019 0412 DNR [id number]  [name], NP Outpatient   07/07/2019 0541 07/08/2019 2307 DNR [id number]  Rise Patience, MD Inpatient   05/18/2019 2309 05/19/2019 2347 DNR [id number]  Shela Leff, MD ED   05/18/2019 2225 05/18/2019 2309 Full Code [id number]  Shela Leff, MD ED   03/02/2019 0229 03/03/2019 2125 Full Code [id number]  Quintella Baton, MD ED   12/25/2018 1853 12/26/2018 1942 Partial Code [id number]  Donne Hazel, MD ED   12/25/2018 1843 12/25/2018 1853 Full Code [id number]  Donne Hazel, MD ED   11/24/2018 2352 11/26/2018 1821 DNR [id number]  Lenore Cordia, MD Inpatient   05/03/2018 1535 05/09/2018 1920 Full Code [id number]  [place],  Deatra Canter, MD Inpatient   06/27/2017 1507 06/29/2017 1553 Full Code [id number]  Waynetta Sandy, MD Inpatient   02/20/2017 0434 02/23/2017 1956 DNR [id number]  Vianne Bulls, MD ED   09/15/2016 0035 09/27/2016 1742 DNR [id number]  Etta Quill, DO ED   08/12/2016 1706 08/18/2016 2132 DNR [id number]  Theodis Blaze, MD Inpatient   07/28/2016 2325 08/01/2016 2110 DNR [id number]  Lily Kocher, MD ED   06/08/2016 1752 06/16/2016 1805 DNR [id number]  Omar Person, NP ED   05/22/2016 0456 05/30/2016 1738 DNR [id number]  Norval [place], MD Inpatient   05/09/2016 1049 05/17/2016 1728 DNR [id number]  Rosita Fire, MD Inpatient   05/08/2016 1759 05/09/2016 1049 Full Code [id number]  Rosita Fire, MD ED   03/02/2016 1755 03/14/2016 2016 Partial Code [id number]  Erma Heritage, [place] Inpatient   02/08/2016 0217 02/12/2016 1746 Partial Code [id number]  Norval Morton, MD ED   01/24/2016 1621 02/02/2016  2056 Partial Code [id number]  Charlie Pitter, PA-C Inpatient   01/24/2016 1611 01/24/2016 1621 Partial Code [id number]  Imogene Burn, PA-C Inpatient   01/24/2016 1611 01/24/2016 1611 Partial Code [id number]  Imogene Burn, PA-C Inpatient   11/06/2015 2300 11/11/2015 2201 Partial Code [id number]  Edwin Dada, MD Inpatient   09/12/2015 0411 09/30/2015 1719 Partial Code [id number]  Vianne Bulls, MD Inpatient   08/26/2015 1918 09/05/2015 1824 Partial Code [id number]  Willia Craze, NP Inpatient   08/26/2015 1527 08/26/2015 1918 Full Code [id number]  Willia Craze, NP ED   08/02/2015 1603 08/05/2015 1731 Partial Code [id number]  Erlene Quan, PA-C ED   07/06/2015 1144 07/11/2015 1636 Partial Code [id number]  Minor, Grace Bushy, NP Inpatient   07/05/2015 2118 07/06/2015 1144 Full Code [id number]  Etta Quill, DO ED   01/28/2015 0047 01/29/2015 1732 DNR [id number]  Lavina Hamman, MD Inpatient   01/14/2015 1409 01/27/2015 2148 DNR [id number]  Drue Novel, NP Inpatient   01/10/2015 0501 01/14/2015 1409 Full Code [id number]  Etta Quill, DO ED   05/21/2014 2324 05/30/2014 1829 DNR [id number]  Etta Quill, DO ED   03/27/2014 1733 03/30/2014 1826 DNR [id number]  Phillips Climes, MD Inpatient   03/13/2014 0226 03/26/2014 1631 Full Code [id number]  Rise Patience, MD Inpatient   01/18/2014 0150 01/22/2014 1708 Full Code [id number]  Jani Gravel, MD Inpatient   12/23/2013 2311 12/26/2013 2218 Full Code [id number]  Phillips Grout, MD Inpatient       Prognosis:  Likely hours once BiPap is removed  Discharge Planning: Anticipated Hospital Death  Care plan was discussed with primary RN, patient's family  Thank you for allowing the Palliative Medicine Team to assist in the care of this patient.   Lin Landsman, NP  Please contact Palliative Medicine Team phone at [phone] for questions and concerns.   *Portions of this note are a verbal dictation therefore any spelling and/or  grammatical errors are due to the "[place] [employer]" system interpretation.

## 2022-06-15 DIAGNOSIS — N179 Acute kidney failure, unspecified: Secondary | ICD-10-CM | POA: Diagnosis not present

## 2022-06-15 DIAGNOSIS — I10 Essential (primary) hypertension: Secondary | ICD-10-CM | POA: Diagnosis not present

## 2022-06-15 DIAGNOSIS — A419 Sepsis, unspecified organism: Secondary | ICD-10-CM | POA: Diagnosis not present

## 2022-06-15 DIAGNOSIS — I480 Paroxysmal atrial fibrillation: Secondary | ICD-10-CM | POA: Diagnosis not present

## 2022-06-15 LAB — BASIC METABOLIC PANEL
Anion gap: 15 (ref 5–15)
BUN: 97 mg/dL — ABNORMAL HIGH (ref 8–23)
CO2: 24 mmol/L (ref 22–32)
Calcium: 8.1 mg/dL — ABNORMAL LOW (ref 8.9–10.3)
Chloride: 96 mmol/L — ABNORMAL LOW (ref 98–111)
Creatinine, Ser: 2.93 mg/dL — ABNORMAL HIGH (ref 0.44–1.00)
GFR, Estimated: 14 mL/min — ABNORMAL LOW (ref >60)
Glucose, Bld: 189 mg/dL — ABNORMAL HIGH (ref 70–99)
Potassium: 3.4 mmol/L — ABNORMAL LOW (ref 3.5–5.1)
Sodium: 135 mmol/L (ref 135–145)

## 2022-06-15 LAB — CBC WITH DIFFERENTIAL/PLATELET
Abs Immature Granulocytes: 0.3 10*3/uL — ABNORMAL HIGH (ref 0.00–0.07)
Basophils Absolute: 0 10*3/uL (ref 0.0–0.1)
Basophils Relative: 0 %
Eosinophils Absolute: 0 10*3/uL (ref 0.0–0.5)
Eosinophils Relative: 0 %
HCT: 22.7 % — ABNORMAL LOW (ref 36.0–46.0)
Hemoglobin: 7.8 g/dL — ABNORMAL LOW (ref 12.0–15.0)
Lymphocytes Relative: 2 %
Lymphs Abs: 0.3 10*3/uL — ABNORMAL LOW (ref 0.7–4.0)
MCH: 32.5 pg (ref 26.0–34.0)
MCHC: 34.4 g/dL (ref 30.0–36.0)
MCV: 94.6 fL (ref 80.0–100.0)
Metamyelocytes Relative: 1 %
Monocytes Absolute: 0.3 10*3/uL (ref 0.1–1.0)
Monocytes Relative: 2 %
Myelocytes: 1 %
Neutro Abs: 13.5 10*3/uL — ABNORMAL HIGH (ref 1.7–7.7)
Neutrophils Relative %: 94 %
Platelets: 297 10*3/uL (ref 150–400)
RBC: 2.4 MIL/uL — ABNORMAL LOW (ref 3.87–5.11)
RDW: 15 % (ref 11.5–15.5)
WBC: 14.4 10*3/uL — ABNORMAL HIGH (ref 4.0–10.5)
nRBC: 0 % (ref 0.0–0.2)
nRBC: 1 /100 WBC — ABNORMAL HIGH

## 2022-06-15 LAB — GLUCOSE, CAPILLARY
Glucose-Capillary: 191 mg/dL — ABNORMAL HIGH (ref 70–99)
Glucose-Capillary: 186 mg/dL — ABNORMAL HIGH (ref 70–99)

## 2022-06-15 LAB — PROCALCITONIN: Procalcitonin: 4.9 ng/mL

## 2022-06-15 MED ORDER — BIOTENE DRY MOUTH MT LIQD: 15.0000 mL | OROMUCOSAL | Status: DC | PRN

## 2022-06-15 MED ORDER — HYDROMORPHONE HCL 1 MG/ML IJ SOLN: 0.5000 mg | INTRAMUSCULAR | Status: DC

## 2022-06-15 MED ORDER — POLYVINYL ALCOHOL 1.4 % OP SOLN: 1.0000 [drp] | Freq: Four times a day (QID) | OPHTHALMIC | Status: DC | PRN

## 2022-06-15 MED ORDER — IPRATROPIUM-ALBUTEROL 0.5-2.5 (3) MG/3ML IN SOLN
3.0000 mL | Freq: Four times a day (QID) | RESPIRATORY_TRACT | Status: DC
  Filled 2022-06-15: qty 3

## 2022-06-15 MED ORDER — POTASSIUM CHLORIDE 20 MEQ PO PACK
40.0000 meq | PACK | Freq: Once | ORAL | Status: AC
  Administered 2022-06-15: 40 meq
  Filled 2022-06-15: qty 2

## 2022-06-15 MED ORDER — LORAZEPAM 2 MG/ML PO CONC: 1.0000 mg | ORAL | Status: DC | PRN

## 2022-06-15 MED ORDER — LORAZEPAM 2 MG/ML IJ SOLN
1.0000 mg | INTRAMUSCULAR | Status: DC
  Administered 2022-06-15: 1 mg via INTRAVENOUS
  Filled 2022-06-15: qty 1

## 2022-06-15 MED ORDER — ATROPINE SULFATE 1 % OP SOLN: 4.0000 [drp] | OPHTHALMIC | Status: DC | PRN

## 2022-06-15 MED ORDER — LORAZEPAM 1 MG PO TABS: 1.0000 mg | ORAL_TABLET | ORAL | Status: DC | PRN

## 2022-06-15 MED ORDER — HYDROMORPHONE BOLUS VIA INFUSION
1.0000 mg | INTRAVENOUS | Status: DC | PRN
  Administered 2022-06-15 (×2): 1 mg via INTRAVENOUS

## 2022-06-15 MED ORDER — LORAZEPAM 2 MG/ML IJ SOLN
1.0000 mg | INTRAMUSCULAR | Status: DC | PRN
  Administered 2022-06-15: 1 mg via INTRAVENOUS
  Filled 2022-06-15: qty 1

## 2022-06-15 MED ORDER — HYDROMORPHONE HCL-NACL 50-0.9 MG/50ML-% IV SOLN
1.0000 mg/h | INTRAVENOUS | Status: DC
  Administered 2022-06-15: 1 mg/h via INTRAVENOUS
  Filled 2022-06-15: qty 50

## 2022-06-21 ENCOUNTER — Ambulatory Visit: Payer: Medicare Other | Admitting: Cardiology

## 2022-06-21 ENCOUNTER — Encounter: Payer: Self-pay | Admitting: Nurse Practitioner

## 2022-07-03 NOTE — Progress Notes (Signed)
This chaplain responded to PMT PA-Josseline's consult for EOL spiritual care. The family is at the bedside participating in a legacy of stories about the Pt. life on her birthday.   The family accepted the chaplain's invitation for prayer. Siblings, children, and grandchildren circled around the Pt. bed.  This chaplain is available for F/U spiritual care as needed.  Chaplain Sallyanne Kuster [phone]

## 2022-07-03 NOTE — Progress Notes (Signed)
Daily Progress Note   Patient Name: Shannon [name]       Date: 06/28/2022 DOB: [birth year]  Age: 89 y.o. MRN#: [id number] Attending Physician: Thurnell Lose, MD Primary Care Physician: [name], NP Admit Date: 06/14/2022  Reason for Consultation/Follow-up: Establishing goals of care  Subjective: Medical records reviewed including progress notes, labs and imaging. Patient assessed at the bedside.  She is on BiPAP, eyes fluttering in response to this PA but not was unable to understand her.  Several family members present visiting.  Discussed with RN.  Created space and opportunity for patient and family's thoughts and feelings on her current illness.  Emotional support and therapeutic listening was provided.  They confirm that they would be ready for removal of BiPAP and transition to full comfort measures in a few minutes.  Patient's granddaughter is an ICU RN and is concerned about staff response time when something is needed such as pain medication.  Dilaudid drip is expected to be started later today but family is not ready yet.  We discussed the option of scheduling Dilaudid IV pushes in the meantime to prompt nursing to come administer.  They are agreeable.  Spiritual care consult was offered and family is receptive.  I then returned to the bedside after receiving secure chat from pharmacy to clarify orders for Dilaudid.  BiPAP was already removed and the patient was started on the Dilaudid drip.  Family is satisfied with the current care plan and no other questions or concerns identified.  Questions and concerns addressed. PMT will continue to support holistically.   [name]  Physical Exam Vitals and nursing note reviewed.  Constitutional:      General: She is not in  acute distress.    Appearance: She is ill-appearing.     Comments: BiPAP in place, removed upon re-visit  Pulmonary:     Effort: No respiratory distress.  Skin:    General: Skin is warm and dry.  Neurological:     Mental Status: She is lethargic.     Motor: Weakness present.  Psychiatric:        Behavior: Behavior is cooperative.            Vital Signs: BP 132/60   Pulse (!) 121   Temp 98.4 F (36.9 C) (Axillary)  Resp (!) 29   Ht '5\' 6"'$  (1.676 m)   Wt 59.3 kg   SpO2 94%   BMI 21.10 kg/m  SpO2: SpO2: 94 % O2 Device: O2 Device: Bi-PAP O2 Flow Rate: O2 Flow Rate (L/min): (S) 11 L/min      Palliative Assessment/Data: PPS 10%   Palliative Care Assessment & Plan   Patient Profile: 89 y.o. female  with past medical history of peripheral artery disease status post stent placement left leg on Friday 06/02/2022, chronic wounds to bilateral feet, COPD with asthma, type 2 diabetes, diabetic polyneuropathy, essential hypertension, chronic anxiety/depression, hypothyroidism, chronic diastolic CHF, CKD 3B, status post PEG, anemia of chronic disease afib not on AC due to GI bleeds, admitted on 06/26/2022 with left leg pain.    Patient is admitted for sepsis likely from left lower extremity cellulitis, AKI on CKD 3B, acute metabolic encephalopathy, hyperkalemia. PMT has been consulted to assist with goals of care conversation.  Assessment: Active Problems:   Sepsis ([facility])   Concern about end of life  Recommendations/Plan: Continue DNR/DNI Patient has transitioned to comfort focused care today after family visitation Adjusted orders to clearly reflect comfort care status; discontinued all medications not aimed at providing comfort Spiritual care consult at patient family's request PMT will continue to follow and support holistically   Prognosis:  Hours - Days  Discharge Planning: Anticipated Hospital Death  Care plan was discussed with primary RN, patient's family, [name]    MDM:  high   Atharva Mirsky Gregary Signs Palliative Medicine Team Team phone # [phone]  Thank you for allowing the Palliative Medicine Team to assist in the care of this patient. Please utilize secure chat with additional questions, if there is no response within 30 minutes please call the above phone number.  Palliative Medicine Team providers are available by phone from 7am to 7pm daily and can be reached through the team cell phone.  Should this patient require assistance outside of these hours, please call the patient's attending physician.  Portions of this note are a verbal dictation therefore any spelling and/or grammatical errors are due to the "[place] One" system interpretation.

## 2022-07-03 NOTE — Death Summary Note (Signed)
[facility] Death Note                                                                                                                                                                                               Shannon [name], is a 89 y.o. female, DOB - 1924-03-04, [id number]  Admit date - 03-10-[name]   Admitting Physician [name], DO  Outpatient Primary MD for the patient is [name], [name], NP  LOS - 7  Chief Complaint  Patient presents with   Altered Mental Status       Notification: [name], NP notified of death of Shannon [name]   Admit Date:  06-11-22  Date of Death:    Time of Death:    [name]    Date and Time of Death - 06-19-22 at  18:15 pm  Pronounced by - RN  History of present illness:   Shannon [name] was a 89 y.o.  female PAD-s/p recent angioplasty/PCI on 3/1, asthma/COPD overlap, DM-2, HTN, chronic HFpEF, CKD stage IIIb, dysphagia-s/p PEG tube placement-on nocturnal feedings-who presented with weakness/confusion, fever, LLE swelling/pain-she was found to have probable LLE cellulitis with AKI-and subsequently admitted to the hospitalist service. She subsequently developed recurrent aspiration pneumonia, was seen by Pall. Care and transitioned to Full comfort care.   Final Diagnoses:  Cause if death - Pneumonia  Signature  -    [name] on 06-19-22 at 6:23 PM   -  To page go to [email or web address]   Total clinical and documentation time for today Under 30 minutes   Last Note                          PROGRESS NOTE        PATIENT DETAILS Name: Shannon [name] Age: 89 y.o. Sex: female Date of Birth: 08-26-1923 Admit Date: 06/11/22 Admitting Physician [name], DO [id number], [name], NP  Brief Summary: Patient is a 89 y.o.  female PAD-s/p recent angioplasty/PCI on 3/1, asthma/COPD overlap, DM-2, HTN, chronic HFpEF, CKD stage  IIIb, dysphagia-s/p PEG tube placement-on nocturnal feedings-who presented with weakness/confusion, fever, LLE swelling/pain-she was found to have probable LLE cellulitis with AKI-and subsequently admitted to the hospitalist service.  Significant events: 3/6>> admit to [facility] 3/7>> episode of aspiration-requiring deep nasotracheal suctioning/oxygen.  Kept NPO. 3/8>> A-fib on telemetry monitor  Significant studies: 3/6>> CXR: No PNA 3/7>> CT abdomen/pelvis: No acute findings. 3/7>> echo: EF 123456, grade 2 diastolic dysfunction. 3/7>> bilateral lower extremity Doppler: No DVT. 3/8>> CXR: Right upper lobe atelectasis.  Significant  microbiology data: 3/6>> COVID/influenza/RSV PCR: Negative 3/6>> blood culture: No growth  Procedures: None  Consults: VVS Palliative care  Subjective:  Patient in bed in BiPAP resting comfortably, somnolent, arousable but minimally responsive.   Objective: Vitals: Blood pressure 132/60, pulse (!) 121, temperature 98.4 F (36.9 C), temperature source Axillary, resp. rate (!) 29, height '5\' 6"'$  (1.676 m), weight 59.3 kg, SpO2 94 %.   Exam:  Somnolent wearing BiPAP, minimally responsive, [place].AT,PERRAL Supple Neck, No JVD,   Symmetrical Chest wall movement, Good air movement bilaterally, +ve rales RRR,No Gallops, Rubs or new Murmurs,  +ve B.Sounds, Abd Soft, No tenderness,  PEG 1+ leg edema    Assessment/Plan: Sepsis likely from LLE cellulitis Sepsis physiology improved Minimal erythema/edema today-better than 3/7 Dopplers negative for DVT  Will discontinue IV vancomycin-as cellulitis is minimal and nonpurulent.  Should be covered by IV Unasyn (for aspiration PNA)  Acute hypoxic respiratory failure due to aspiration pneumonia, ongoing aspiration of saliva and likely gastric contents along with acute on chronic CHF. Had aspiration episode on 3/7-briefly required NRB-nasotracheal suctioning-was subsequently transition to 3-4 L of oxygen.   Improving-have transitioned her to room air today Appreciate SLP evaluation 3/9-okay to allow sips.  Per patient-she was on a liquid diet at home (gets nocturnal tube feeds).  She understands the risk of aspiration-and would want to eat liquids if offered for comfort/pleasure.   On IV Unasyn-5 days total-started 3/7 Unfortunately continued aspiration of both oral intake and likely tube feeds and saliva, much worse night of 3/10/[name], tube feeding held for a few hours thereafter at a lower rate, had long discussion with patient and patient's daughter along with granddaughter bedside on her poor prognosis on 3/11/[name].  They will discuss amongst themselves and let us know about long-term plan on 3/12/[name].  Chest x-ray continues to get worse on 3/12/[name] Unasyn added, requiring BiPAP almost every night, again prognosis appears extremely poor.    He was seen by palliative care after detailed discussions with family it was decided that patient will be continued on gentle medical treatment directed towards keeping her comfortable, continue DNR, family members visiting from outside.  She appears terminal and family is at terms with it.  If at any point she becomes uncomfortable then full comfort measures will be initiated.  Likely family members to arrive on 03/07/[name] there after full comfort measures.  AKI on CKD stage IIIb with baseline creatinine close to 2 Suspect hemodynamically mediated in the setting of sepsis but did have recent angiography done so could be some element of contrast-induced nephropathy No hydronephrosis on CT abdomen Creatinine now downtrending Continue supportive care Avoid nephrotoxic agents  Acute encephalopathy Apparently was weak/confused when she first presented-seems to have improved rapidly Suspect this could have been from AKI Supportive care  Normocytic anemia No overt evidence of blood loss-no BM for the past several days No history of hematochezia-some intermittent  dark stools but patient attributes this to iron therapy Hb levels fluctuating but relatively stable since yesterday Await FOBT Continue PPI twice daily Follow and transfuse accordingly.    Acute on chronic diastolic CHF, EF preserved at 60% on recent echo.  High-dose Lasix repeated on 3/11/[name] along with Zaroxolyn, volume status appears stable on 3/12/[name].  Elevated troponins Demand ischemia She has no chest pain Echo with stable EF Given AKI/advanced age-do not think she is a good candidate for any further intervention appropriate to manage this medically.  Hyperkalemia.  Lokelma PEG tube.    Paroxysmal atrial fibrillation Noted  on telemetry monitor Rate controlled without the use of any rate control agents Given severity of anemia/advanced age-already on DAPT-do not think she is a good long-term anticoagulation candidate.  This was discussed with patient/daughter/granddaughter on 3/8-all understand the embolic risk and agree to keep her off anticoagulation at this point-she has had prior history of recurrent GI bleeding.  Reviewed prior charts-has had A-fib in the past-deemed not  anticoagulation candidate.  HTN BP stable Continue amlodipine  History of PAD-s/p numerous toe amputations-s/p most recent PCI on 3/1 with angioplasty plus stenting Continue DAPT Continue high intensity statin Appreciate vascular surgery input   Dysphagia Usually on nocturnal tube feeds + liquid diet at home Had aspiration episode 3/7-kept n.p.o.  SLP following Patient understands the risk of resuming oral intake-including life-threatening/life disabling risks-accepts risks-and would like to resume liquids for comfort/pleasure.  Note-tube feeds switched from nocturnal to continuous  Hypothyroidism Continue Synthroid  Peripheral neuropathy Continue Neurontin Cymbalta on hold as cannot be given through PEG tube  Asthma/COPD overlap Not in exacerbation Continue  bronchodilators  Debility/deconditioning PT/OT  Palliative care Confirms DNR Suspect benefits from gentle medical treatment-clearly not a candidate for aggressive care at this point Palliative care consult  has been placed by admitting MD-will await further recommendations.  BMI: Estimated body mass index is 21.1 kg/m as calculated from the following:   Height as of this encounter: '5\' 6"'$  (1.676 m).   Weight as of this encounter: 59.3 kg.   Code status:   Code Status: DNR   DVT Prophylaxis:IV heparin   Family Communication: [id number] grand daughter updated bedside on 3/11/[name] in detail, updated daughter bedside and family member who is a Therapist, sports over daughter's phone on 06/13/22.  Female family member at bedside updated on 3/13/[name].   Disposition Plan: Status is: Inpatient Remains inpatient appropriate because: Severity of illness   Planned Discharge Destination:Home   Diet: Diet Order             Diet NPO time specified Except for: Other (See Comments)  Diet effective now                    MEDICATIONS: Scheduled Meds:  artificial tears   Both Eyes QHS   cycloSPORINE  1 drop Both Eyes BID   fluticasone  2 spray Each Nare Daily   gabapentin  300 mg Per Tube QHS   ipratropium-albuterol  3 mL Nebulization Q6H   latanoprost  1 drop Both Eyes QHS   LORazepam  1 mg Intravenous Q1H   mometasone-formoterol  2 puff Inhalation BID   pantoprazole (PROTONIX) IV  40 mg Intravenous Q12H   polyvinyl alcohol  1 drop Both Eyes QID   Continuous Infusions:  HYDROmorphone 1 mg/hr (03/26/[name] 1520)   PRN Meds:.acetaminophen, antiseptic oral rinse, atropine, guaiFENesin, HYDROmorphone, ipratropium-albuterol, polyethylene glycol   I have personally reviewed following labs and imaging studies  LABORATORY DATA:  Recent Labs  Lab 06/11/22 0330 06/12/22 0219 06/13/22 0332 06/14/22 0316 03/13/[name] 0216  WBC 7.1 7.8 12.8* 12.4* 14.4*  HGB 7.8* 8.1* 8.0* 8.1*  7.8*  HCT 22.2* 24.2* 23.1* 22.7* 22.7*  PLT 201 240 289 283 297  MCV 97.4 100.0 97.1 93.8 94.6  MCH 34.2* 33.5 33.6 33.5 32.5  MCHC 35.1 33.5 34.6 35.7 34.4  RDW 15.8* 15.6* 15.7* 15.1 15.0  LYMPHSABS 0.5* 0.5* 0.6* 0.4* 0.3*  MONOABS 0.7 0.7 1.0 0.5 0.3  EOSABS 0.5 0.2 0.2 0.1 0.0  BASOSABS 0.0 0.0 0.0 0.1 0.0  Recent Labs  Lab 06/11/22 0330 06/12/22 0219 06/13/22 0332 06/14/22 0316 03/27/[name] 0216  NA 137 135 135 134* 135  K 5.0 4.9 5.6* 5.3* 3.4*  CL 105 101 100 96* 96*  CO2 21* 22 20* 23 24  ANIONGAP '11 12 15 15 15  '$ GLUCOSE 201* 299* 204* 172* 189*  BUN 72* 71* 82* 99* 97*  CREATININE 2.09* 1.91* 2.34* 3.12* 2.93*  CRP 19.5* 16.1* 16.6* 24.1*  --   PROCALCITON 2.73 1.72 1.55 3.04 4.90  HGBA1C  --  6.4*  --   --   --   BNP 1,824.7* 1,517.1* 1,356.3* 2,706.2*  --   MG 2.5* 2.3 2.3 2.4  --   CALCIUM 8.5* 8.6* 8.7* 8.4* 8.1*      RADIOLOGY STUDIES/RESULTS: No results found.   LOS: 7 days   Signature  -    [name] on 03/07/[name] at 6:23 PM   -  To page go to [email or web address]

## 2022-07-03 NOTE — Progress Notes (Signed)
PROGRESS NOTE        PATIENT DETAILS Name: Shannon [name] Age: 89 y.o. Sex: female Date of Birth: 24-Feb-1924 Admit Date: 06/25/2022 Admitting Physician [name], DO WZ:[id number], [name], NP  Brief Summary: Patient is a 89 y.o.  female PAD-s/p recent angioplasty/PCI on 3/1, asthma/COPD overlap, DM-2, HTN, chronic HFpEF, CKD stage IIIb, dysphagia-s/p PEG tube placement-on nocturnal feedings-who presented with weakness/confusion, fever, LLE swelling/pain-she was found to have probable LLE cellulitis with AKI-and subsequently admitted to the hospitalist service.  Significant events: 3/6>> admit to [facility] 3/7>> episode of aspiration-requiring deep nasotracheal suctioning/oxygen.  Kept NPO. 3/8>> A-fib on telemetry monitor  Significant studies: 3/6>> CXR: No PNA 3/7>> CT abdomen/pelvis: No acute findings. 3/7>> echo: EF 123456, grade 2 diastolic dysfunction. 3/7>> bilateral lower extremity Doppler: No DVT. 3/8>> CXR: Right upper lobe atelectasis.  Significant microbiology data: 3/6>> COVID/influenza/RSV PCR: Negative 3/6>> blood culture: No growth  Procedures: None  Consults: VVS Palliative care  Subjective:  Patient in bed in BiPAP resting comfortably, somnolent, arousable but minimally responsive.   Objective: Vitals: Blood pressure (!) 127/59, pulse (!) 118, temperature 98.4 F (36.9 C), temperature source Axillary, resp. rate (!) 29, height '5\' 6"'$  (1.676 m), weight 59.3 kg, SpO2 94 %.   Exam:  Somnolent wearing BiPAP, minimally responsive, [place].AT,PERRAL Supple Neck, No JVD,   Symmetrical Chest wall movement, Good air movement bilaterally, +ve rales RRR,No Gallops, Rubs or new Murmurs,  +ve B.Sounds, Abd Soft, No tenderness,  PEG 1+ leg edema    Assessment/Plan: Sepsis likely from LLE cellulitis Sepsis physiology improved Minimal erythema/edema today-better than 3/7 Dopplers negative for DVT  Will discontinue IV vancomycin-as  cellulitis is minimal and nonpurulent.  Should be covered by IV Unasyn (for aspiration PNA)  Acute hypoxic respiratory failure due to aspiration pneumonia, ongoing aspiration of saliva and likely gastric contents along with acute on chronic CHF. Had aspiration episode on 3/7-briefly required NRB-nasotracheal suctioning-was subsequently transition to 3-4 L of oxygen.  Improving-have transitioned her to room air today Appreciate SLP evaluation 3/9-okay to allow sips.  Per patient-she was on a liquid diet at home (gets nocturnal tube feeds).  She understands the risk of aspiration-and would want to eat liquids if offered for comfort/pleasure.   On IV Unasyn-5 days total-started 3/7 Unfortunately continued aspiration of both oral intake and likely tube feeds and saliva, much worse night of 06/11/2022, tube feeding held for a few hours thereafter at a lower rate, had long discussion with patient and patient's daughter along with granddaughter bedside on her poor prognosis on 06/12/2022.  They will discuss amongst themselves and let us know about long-term plan on 06/13/2022.  Chest x-ray continues to get worse on 06/13/2022 Unasyn added, requiring BiPAP almost every night, again prognosis appears extremely poor.    He was seen by palliative care after detailed discussions with family it was decided that patient will be continued on gentle medical treatment directed towards keeping her comfortable, continue DNR, family members visiting from outside.  She appears terminal and family is at terms with it.  If at any point she becomes uncomfortable then full comfort measures will be initiated.  Likely family members to arrive on 06/10/2022 there after full comfort measures.  AKI on CKD stage IIIb with baseline creatinine close to 2 Suspect hemodynamically mediated in the setting of sepsis but did have recent angiography  done so could be some element of contrast-induced nephropathy No hydronephrosis on CT  abdomen Creatinine now downtrending Continue supportive care Avoid nephrotoxic agents  Acute encephalopathy Apparently was weak/confused when she first presented-seems to have improved rapidly Suspect this could have been from AKI Supportive care  Normocytic anemia No overt evidence of blood loss-no BM for the past several days No history of hematochezia-some intermittent dark stools but patient attributes this to iron therapy Hb levels fluctuating but relatively stable since yesterday Await FOBT Continue PPI twice daily Follow and transfuse accordingly.    Acute on chronic diastolic CHF, EF preserved at 60% on recent echo.  High-dose Lasix repeated on 06/12/2022 along with Zaroxolyn, volume status appears stable on 06/13/2022.  Elevated troponins Demand ischemia She has no chest pain Echo with stable EF Given AKI/advanced age-do not think she is a good candidate for any further intervention appropriate to manage this medically.  Hyperkalemia.  Lokelma PEG tube.    Paroxysmal atrial fibrillation Noted on telemetry monitor Rate controlled without the use of any rate control agents Given severity of anemia/advanced age-already on DAPT-do not think she is a good long-term anticoagulation candidate.  This was discussed with patient/daughter/granddaughter on 3/8-all understand the embolic risk and agree to keep her off anticoagulation at this point-she has had prior history of recurrent GI bleeding.  Reviewed prior charts-has had A-fib in the past-deemed not  anticoagulation candidate.  HTN BP stable Continue amlodipine  History of PAD-s/p numerous toe amputations-s/p most recent PCI on 3/1 with angioplasty plus stenting Continue DAPT Continue high intensity statin Appreciate vascular surgery input   Dysphagia Usually on nocturnal tube feeds + liquid diet at home Had aspiration episode 3/7-kept n.p.o.  SLP following Patient understands the risk of resuming oral intake-including  life-threatening/life disabling risks-accepts risks-and would like to resume liquids for comfort/pleasure.  Note-tube feeds switched from nocturnal to continuous  Hypothyroidism Continue Synthroid  Peripheral neuropathy Continue Neurontin Cymbalta on hold as cannot be given through PEG tube  Asthma/COPD overlap Not in exacerbation Continue bronchodilators  Debility/deconditioning PT/OT  Palliative care Confirms DNR Suspect benefits from gentle medical treatment-clearly not a candidate for aggressive care at this point Palliative care consult  has been placed by admitting MD-will await further recommendations.  BMI: Estimated body mass index is 21.1 kg/m as calculated from the following:   Height as of this encounter: '5\' 6"'$  (1.676 m).   Weight as of this encounter: 59.3 kg.   Code status:   Code Status: DNR   DVT Prophylaxis:IV heparin   Family Communication: [id number] grand daughter updated bedside on 06/12/2022 in detail, updated daughter bedside and family member who is a Therapist, sports over daughter's phone on 06/13/22.  Female family member at bedside updated on 06/14/2022.   Disposition Plan: Status is: Inpatient Remains inpatient appropriate because: Severity of illness   Planned Discharge Destination:Home   Diet: Diet Order             Diet NPO time specified Except for: Other (See Comments)  Diet effective now                    MEDICATIONS: Scheduled Meds:  artificial tears   Both Eyes QHS   aspirin  81 mg Per Tube Daily   atorvastatin  40 mg Per Tube Daily   clopidogrel  75 mg Per Tube Daily   cycloSPORINE  1 drop Both Eyes BID   diltiazem  90 mg Oral Q8H   ferrous sulfate  300  mg Per Tube Daily   fluticasone  2 spray Each Nare Daily   gabapentin  300 mg Per Tube QHS   latanoprost  1 drop Both Eyes QHS   levothyroxine  100 mcg Per Tube Q0600   loratadine  10 mg Per Tube QPM   mometasone-formoterol  2 puff Inhalation BID    multivitamin with minerals  1 tablet Per Tube Daily   pantoprazole (PROTONIX) IV  40 mg Intravenous Q12H   polyethylene glycol  17 g Per Tube Daily   polyvinyl alcohol  1 drop Both Eyes QID   Continuous Infusions:  ampicillin-sulbactam (UNASYN) IV 3 g (06/18/2022 0850)   dextrose 35 mL/hr at 06/14/22 1959   PRN Meds:.acetaminophen, diltiazem, guaiFENesin, HYDROmorphone (DILAUDID) injection, ipratropium-albuterol, oxyCODONE, polyethylene glycol   I have personally reviewed following labs and imaging studies  LABORATORY DATA:  Recent Labs  Lab 06/11/22 0330 06/12/22 0219 06/13/22 0332 06/14/22 0316 06/17/2022 0216  WBC 7.1 7.8 12.8* 12.4* 14.4*  HGB 7.8* 8.1* 8.0* 8.1* 7.8*  HCT 22.2* 24.2* 23.1* 22.7* 22.7*  PLT 201 240 289 283 297  MCV 97.4 100.0 97.1 93.8 94.6  MCH 34.2* 33.5 33.6 33.5 32.5  MCHC 35.1 33.5 34.6 35.7 34.4  RDW 15.8* 15.6* 15.7* 15.1 15.0  LYMPHSABS 0.5* 0.5* 0.6* 0.4* 0.3*  MONOABS 0.7 0.7 1.0 0.5 0.3  EOSABS 0.5 0.2 0.2 0.1 0.0  BASOSABS 0.0 0.0 0.0 0.1 0.0    Recent Labs  Lab 06/11/22 0330 06/12/22 0219 06/13/22 0332 06/14/22 0316 06/29/2022 0216  NA 137 135 135 134* 135  K 5.0 4.9 5.6* 5.3* 3.4*  CL 105 101 100 96* 96*  CO2 21* 22 20* 23 24  ANIONGAP '11 12 15 15 15  '$ GLUCOSE 201* 299* 204* 172* 189*  BUN 72* 71* 82* 99* 97*  CREATININE 2.09* 1.91* 2.34* 3.12* 2.93*  CRP 19.5* 16.1* 16.6* 24.1*  --   PROCALCITON 2.73 1.72 1.55 3.04 4.90  HGBA1C  --  6.4*  --   --   --   BNP 1,824.7* 1,517.1* 1,356.3* 2,706.2*  --   MG 2.5* 2.3 2.3 2.4  --   CALCIUM 8.5* 8.6* 8.7* 8.4* 8.1*      RADIOLOGY STUDIES/RESULTS: No results found.   LOS: 7 days   Signature  -    [name] on 06/25/2022 at 10:49 AM   -  To page go to [email or web address]

## 2022-07-03 DEATH — deceased

## 2022-07-24 ENCOUNTER — Ambulatory Visit: Payer: Medicare Other | Admitting: Podiatry

## 2022-07-26 ENCOUNTER — Other Ambulatory Visit (HOSPITAL_COMMUNITY): Payer: Medicare Other

## 2022-09-23 IMAGING — CT CT ABD-PELV W/ CM
2 of 5 series · 15 of 46 positions shown, 17 images · IV contrast (omnipaque)
Comparison: CT 09/24/2019, interventional catheter replacement
procedure 01/09/2020

CLINICAL DATA: Abdominal pain.  Diffuse abdominal pain for 2 days

EXAM:
CT ABDOMEN AND PELVIS WITH CONTRAST
TECHNIQUE: Multidetector CT imaging of the abdomen and pelvis was performed
using the standard protocol following bolus administration of
intravenous contrast.
CONTRAST:  80mL OMNIPAQUE IOHEXOL 300 MG/ML  SOLN

[Series 2: axial st · axial · 0.91mm/px · z∈[+988,+1418]mm · 12 of 102 slices shown, 14 images]
[im 8/102  soft-tissue]
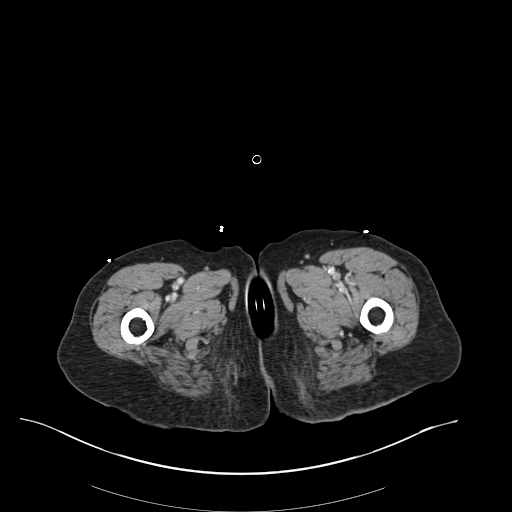
[im 8/102  bone]
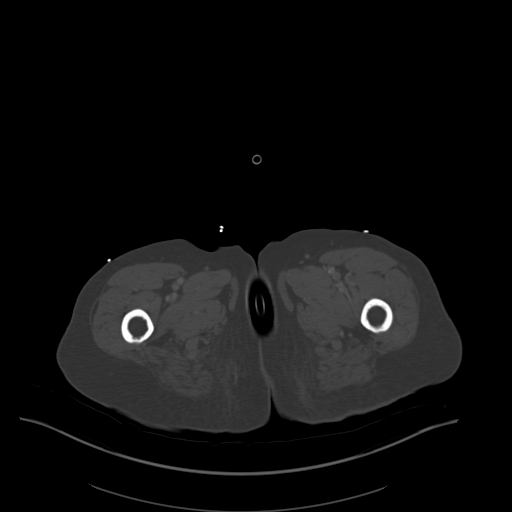
[im 16/102  soft-tissue]
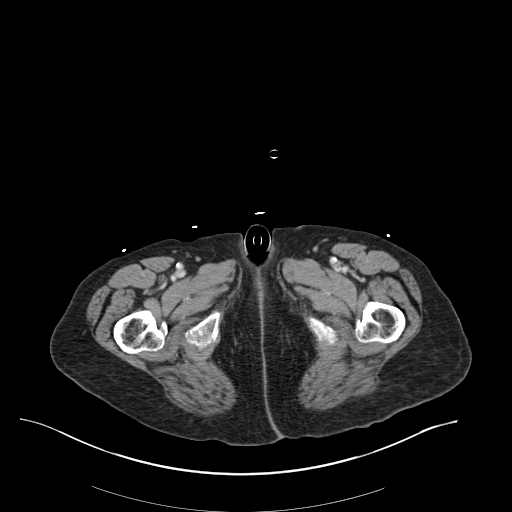
[im 24/102  soft-tissue]
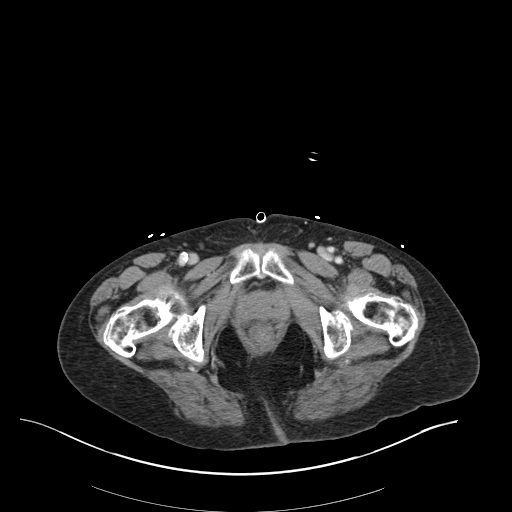
[im 32/102  soft-tissue]
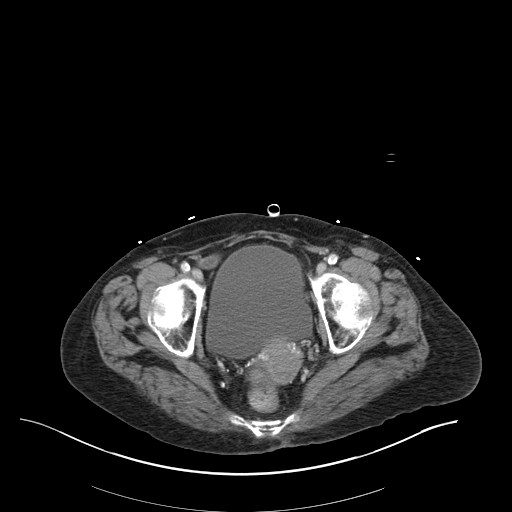
[im 39/102  soft-tissue]
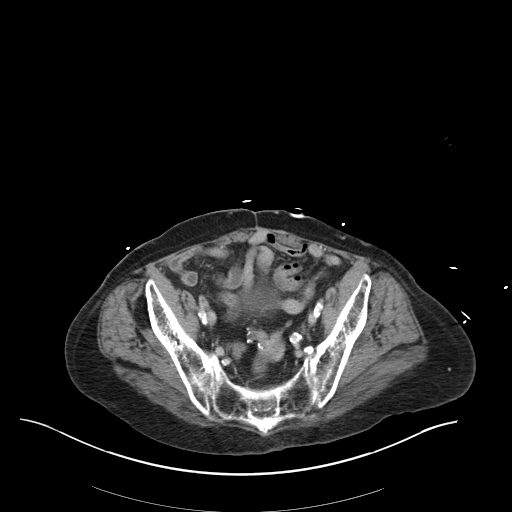
[im 47/102  soft-tissue]
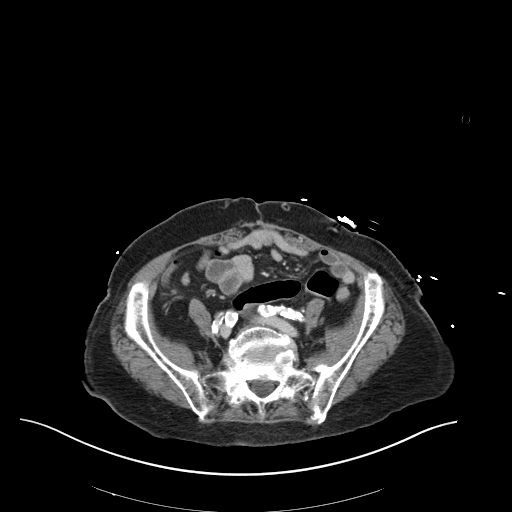
[im 55/102  soft-tissue]
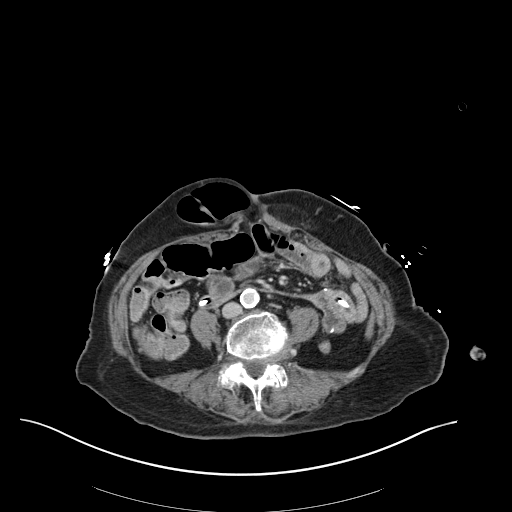
[im 63/102  soft-tissue]
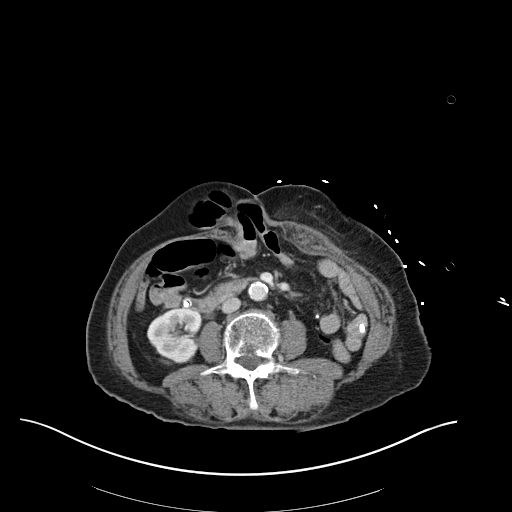
[im 70/102  soft-tissue]
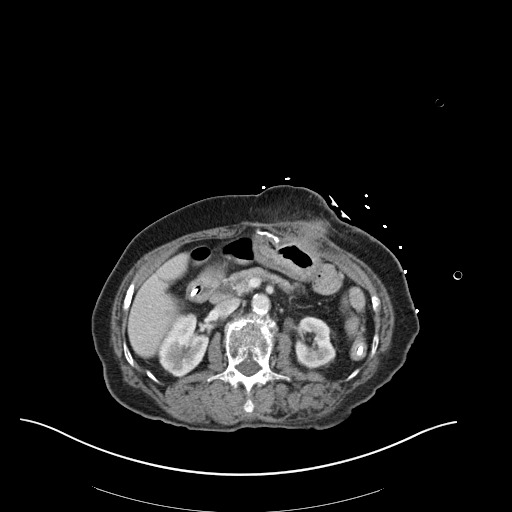
[im 70/102  bone]
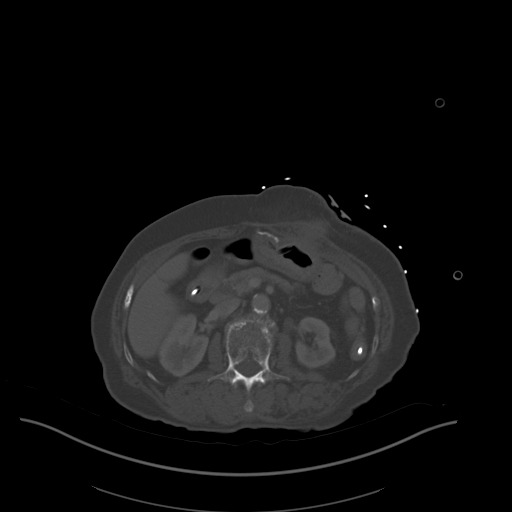
[im 78/102  soft-tissue]
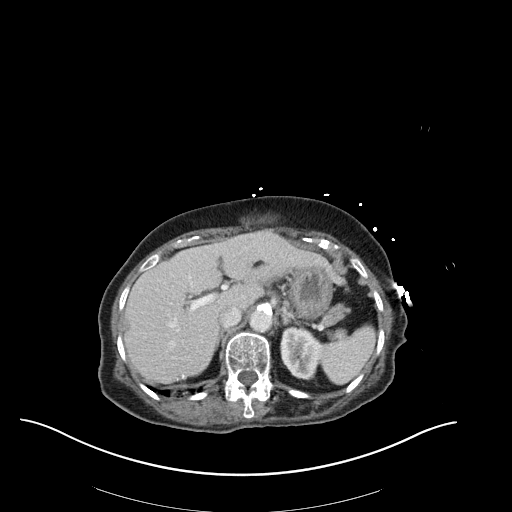
[im 86/102  soft-tissue]
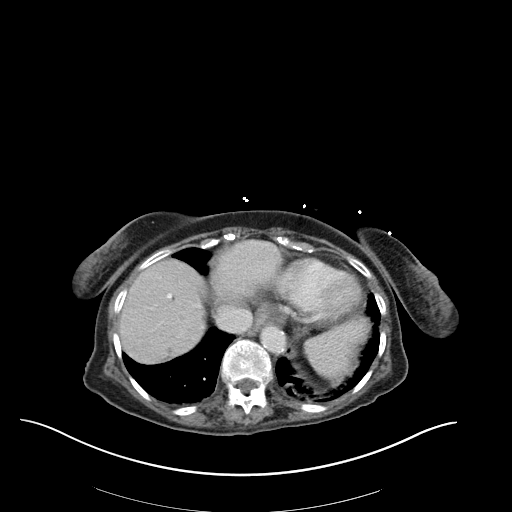
[im 94/102  soft-tissue]
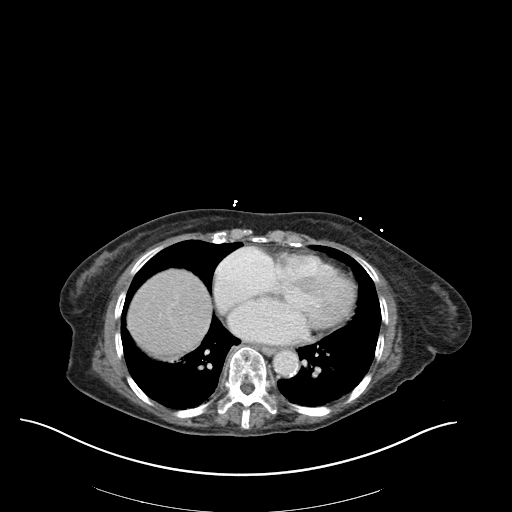

[Series 5: coronal st · coronal · 0.86mm/px · 3 of 153 slices shown]
[im 51/153  soft-tissue]
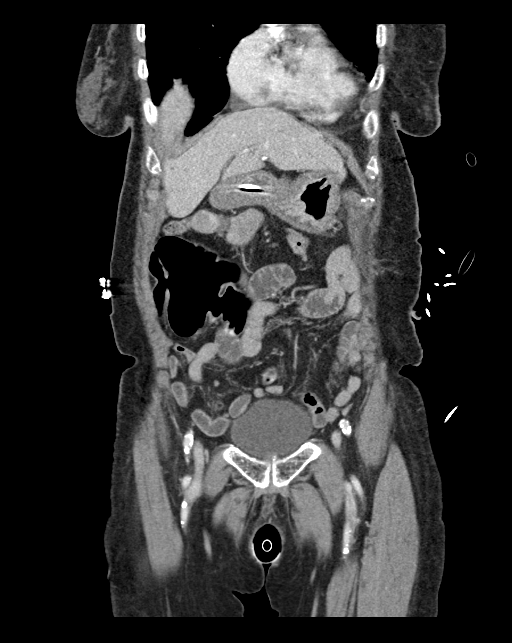
[im 68/153  soft-tissue]
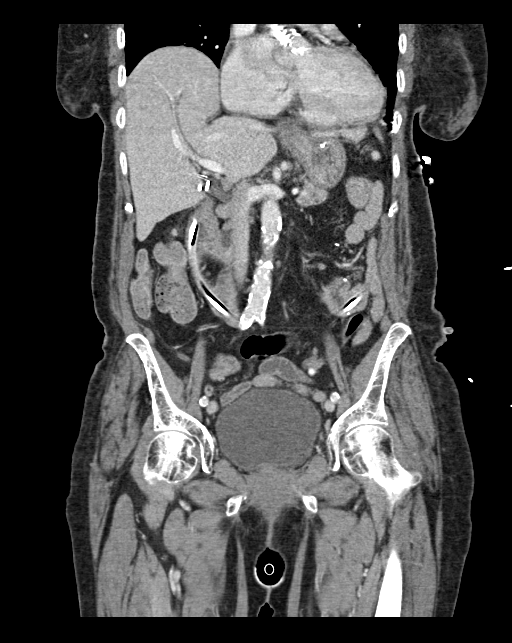
[im 85/153  soft-tissue]
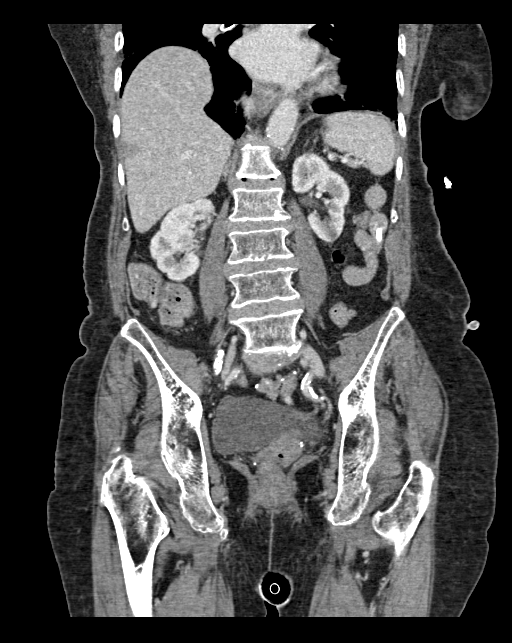

[15 of 46 positions shown; findings below may reference images not displayed]

FINDINGS: Lower chest: Lung bases are clear.

Hepatobiliary: No focal hepatic lesion. Postcholecystectomy. No
biliary dilatation. Several benign hepatic cysts. No biliary duct
dilatation.

Pancreas: Pancreas is normal. No ductal dilatation. No pancreatic
inflammation.

Spleen: Normal spleen

Adrenals/urinary tract: Adrenal glands and kidneys are normal. The
ureters and bladder normal.

Stomach/Bowel: Stomach is decompressed. There is a percutaneous
gastrostomy tube with jejunal extension.

The expanded retention bulb of the gastrostomy tube is within the
access tract at the interface of the subcutaneous tissue and rectus
abdominus (image 36/2).

The J arm extensions is within the jejunum. No fluid collections in
the peritoneal space or subcutaneous tissue.

There is no evidence of bowel obstruction. The jejunum and ileum are
normal. Colon is normal. There is a a ventral hernia RIGHT of
midline which contains a nonobstructed loop of transverse colon.
Findings not changed from comparison exam. Rectosigmoid colon
normal.

Vascular/Lymphatic: Abdominal aorta is normal caliber with
atherosclerotic calcification. There is no retroperitoneal or
periportal lymphadenopathy. No pelvic lymphadenopathy.

Reproductive: Uterus and adnexa unremarkable.

Other: No free fluid.

Musculoskeletal: Sclerotic lesion in the L2 vertebral body
associated endplate favored
IMPRESSION: 1. Retention bulb of the percutaneous gastrostomy tube is expanded
within the access tract at the junction of the subcutaneous fat and
LEFT rectus abdominus muscle. Recommend interventional radiology
exchange/repositioning.
2. Jejunal extension of the gastrostomy tube is in good position. No
evidence of bowel obstruction.
3. Midline ventral hernia contains a segment of nonobstructed
transverse colon. No change from prior.

## 2022-09-30 IMAGING — CR DG ABDOMEN 1V
1 series · 1 of 1 positions shown · non-contrast
Comparison: 09/24/2019

CLINICAL DATA: Peg tube replacement

EXAM:
ABDOMEN - 1 VIEW

[x abdomen supine]
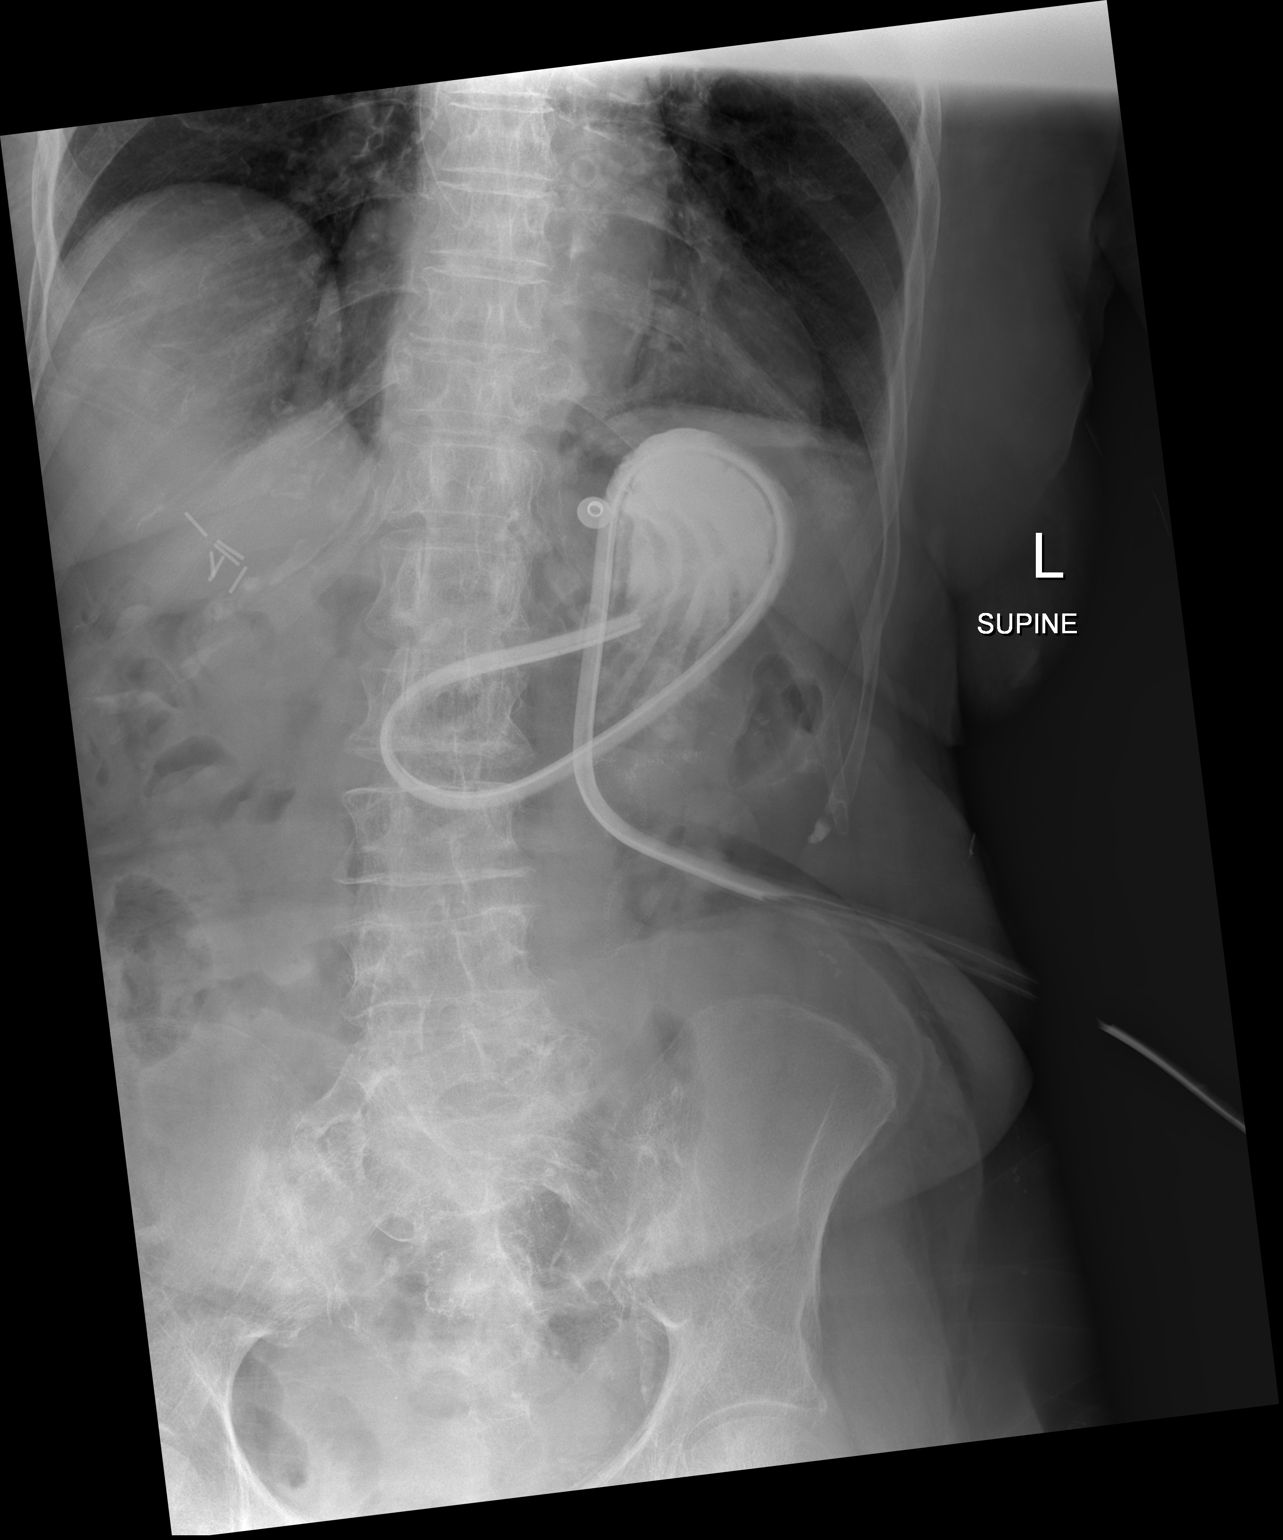

[1 of 1 positions shown; findings below may reference images not displayed]

FINDINGS: Supine frontal view of the abdomen and pelvis excluding the right
flank was performed after the clinician injected contrast through
the indwelling percutaneous gastrostomy tube. No evidence of
contrast extravasation, with injected contrast outlining the rugal
folds. Bowel gas pattern is unremarkable.
IMPRESSION: 1. Peg tube within the gastric lumen. No evidence of contrast
extravasation.

## 2022-10-01 IMAGING — XA IR CONVERT G-TUBE TO G-JTUBE
4 series · 12 of 16 positions shown · non-contrast
Comparison: none

INDICATION: Dysfunctioning malpositioned GJ FEEDING TUBE

[Series 1: fl - angio · 3 of 192 frames shown (1 of 3)]
[frame 29/192]
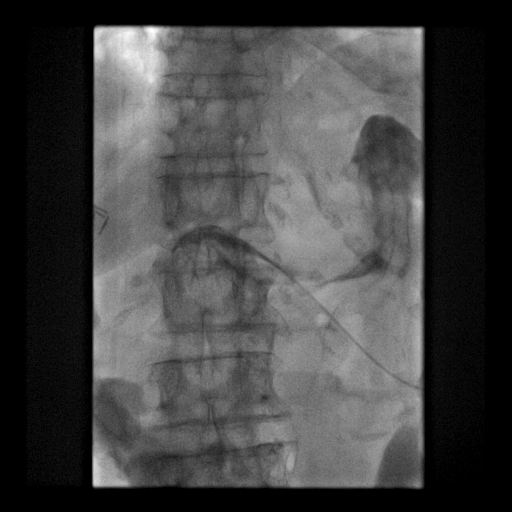
[frame 164/192]
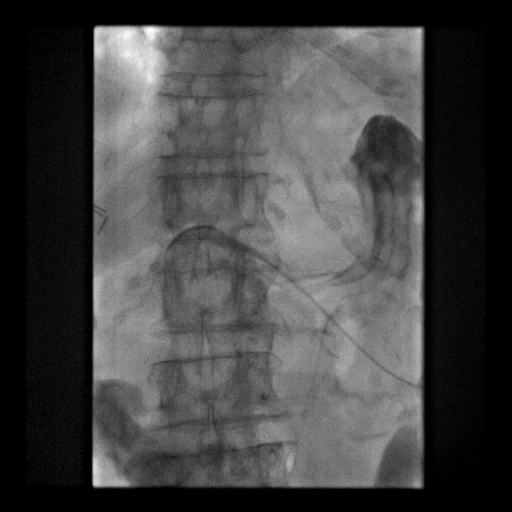
[frame 190/192]
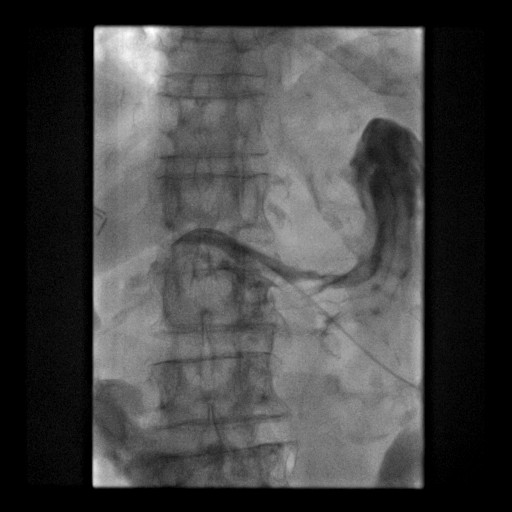

[Series 3: fl - angio · 2 of 22 frames shown (2 of 3)]
[frame 4/22]
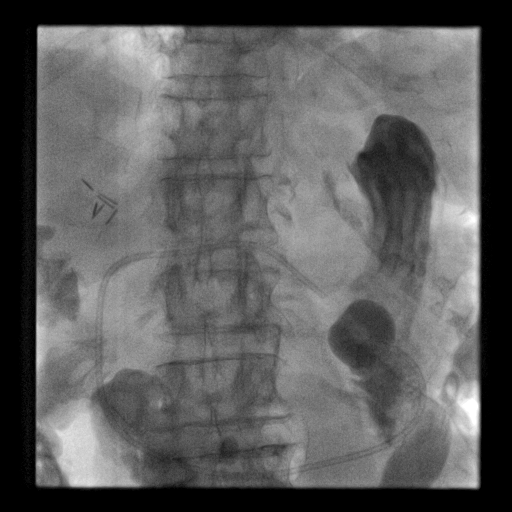
[frame 19/22]
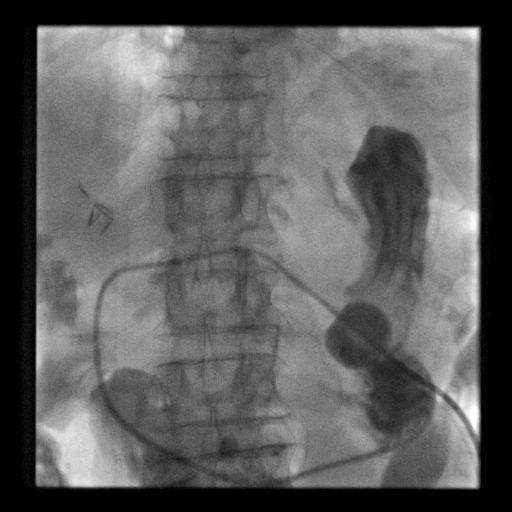

[Series 4: fl - angio · 3 of 23 frames shown (3 of 3)]
[frame 2/23]
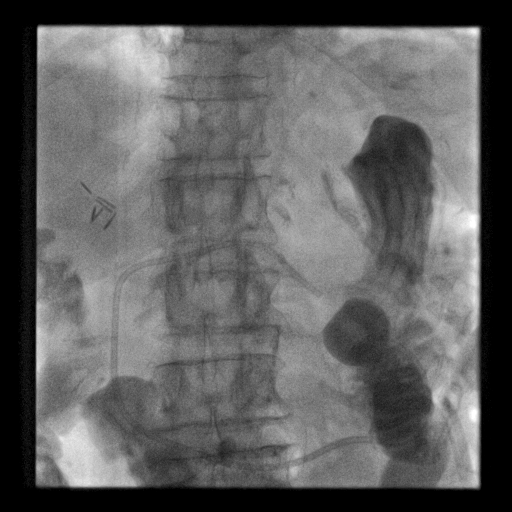
[frame 4/23]
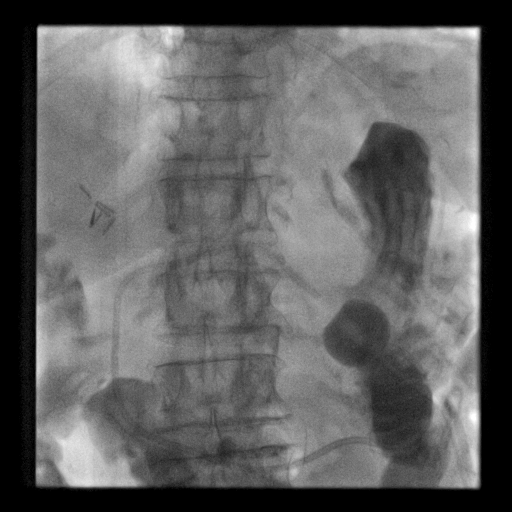
[frame 20/23]
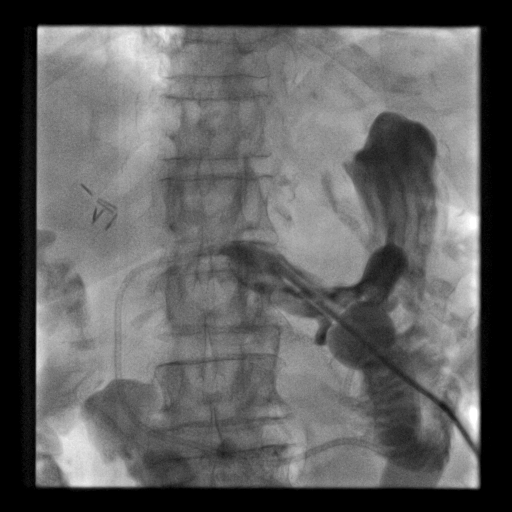

[Series 300: ir gastr tube convert gastr-jej per w/fl · 4 of 5 slices shown]
[im 1/5]
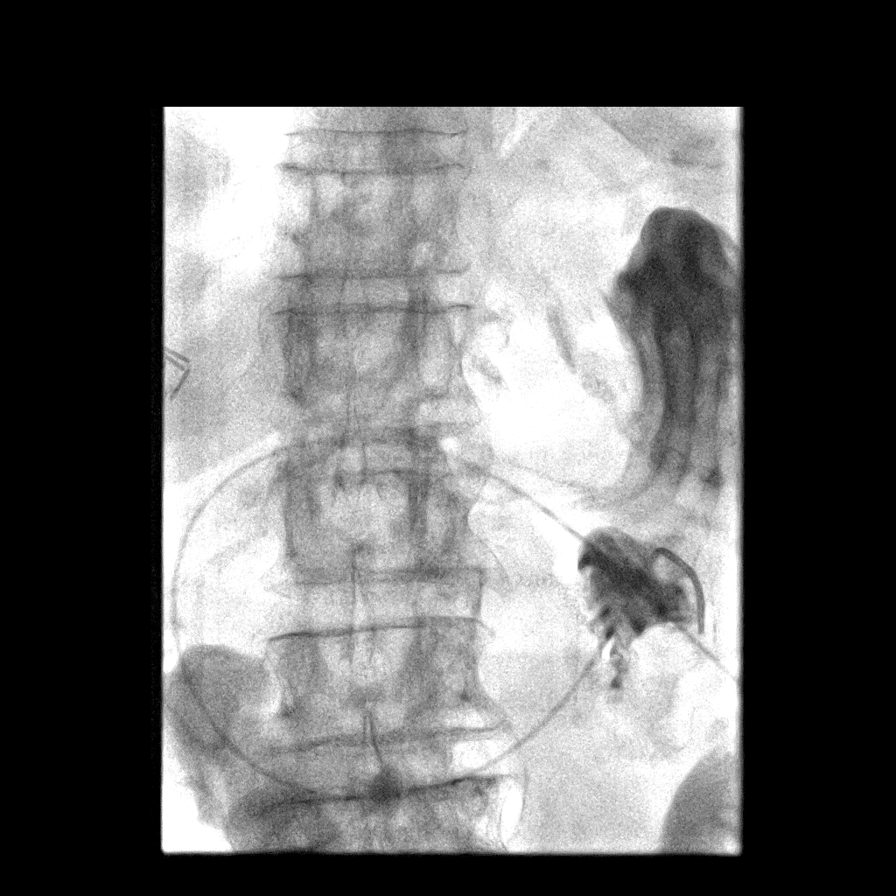
[im 2/5]
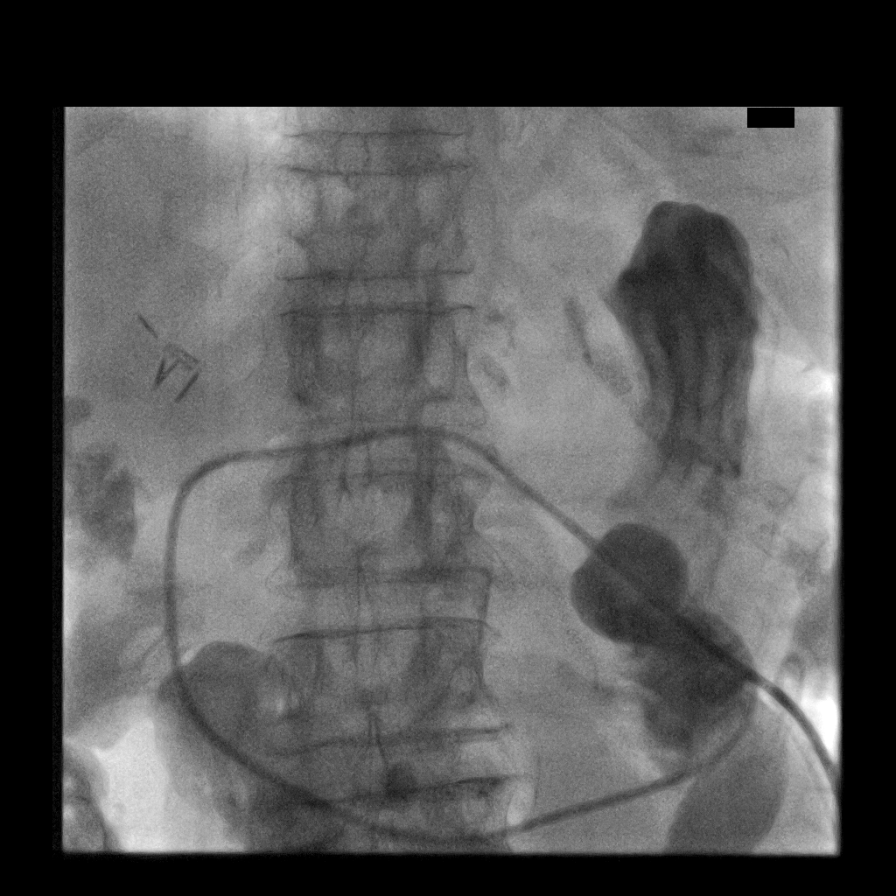
[im 4/5]
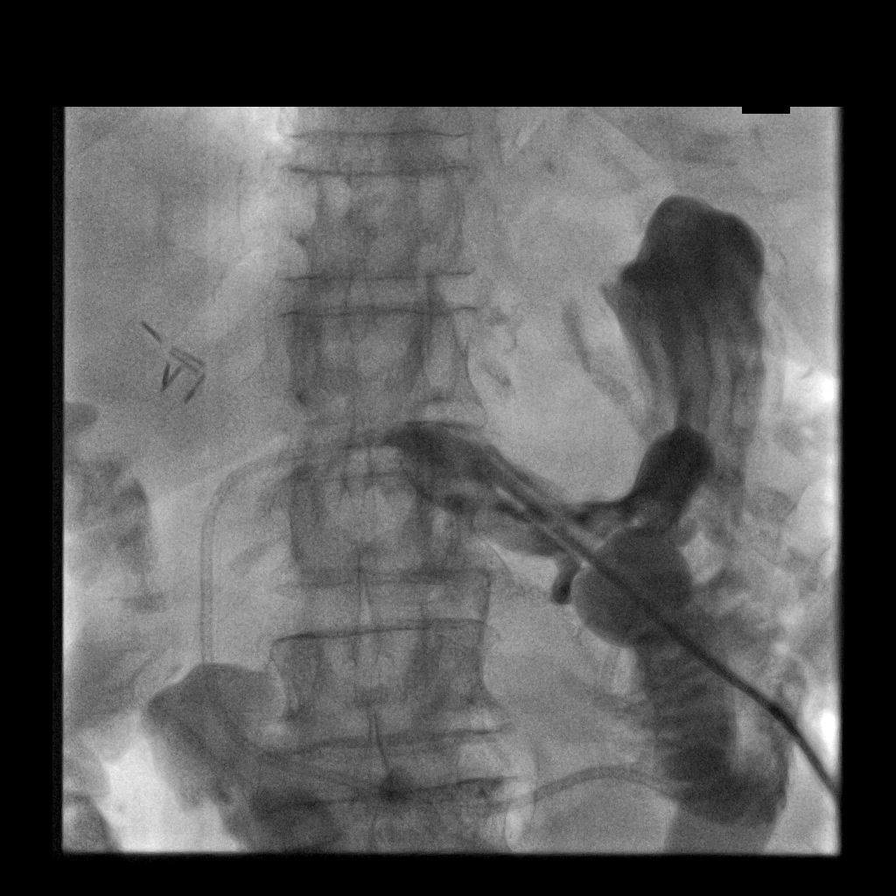
[im 5/5]
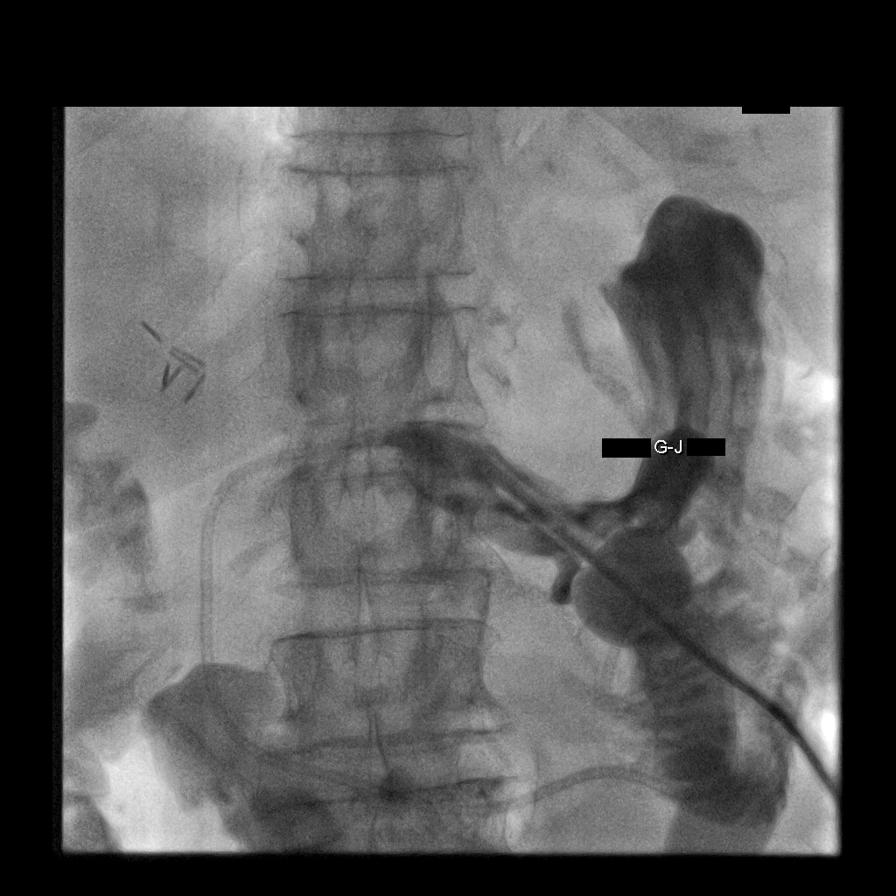

[12 of 16 positions shown; findings below may reference images not displayed]

EXAM:
FLUOROSCOPIC REPOSITION/REPLACEMENT OF THE 22 FRENCH GJ FEEDING TUBE

MEDICATIONS:
NONE.

ANESTHESIA/SEDATION:
Moderate Sedation Time:  None.

The patient was continuously monitored during the procedure by the
interventional radiology nurse under my direct supervision.

CONTRAST:  20 cc-administered into the gastric lumen.

FLUOROSCOPY TIME:  Fluoroscopy Time: 6 minutes 6 seconds (64 mGy).

COMPLICATIONS:
None immediate.

PROCEDURE:
Informed written consent was obtained from the patient's family
after a thorough discussion of the procedural risks, benefits and
alternatives. All questions were addressed. Maximal Sterile Barrier
Technique was utilized including caps, mask, sterile gowns, sterile
gloves, sterile drape, hand hygiene and skin antiseptic. A timeout
was performed prior to the initiation of the procedure.

Existing malpositioned GJ tube coiled in the stomach was removed.
Kumpe catheter and stiff Glidewire were utilized to advance the
access into the proximal jejunum. Contrast injection confirms
position. The 22 French balloon retention GJ tube was advanced with
the tip position in the proximal jejunum. Gastrostomy port confirmed
stomach. Inflation balloon inflated with 10 cc volume containing 1
cc contrast. Images obtained for documentation.
IMPRESSION: Successful exchange/reposition of the 22 French GJ feeding tube.
Ready for use.

## 2022-10-13 IMAGING — XA IR REPLACE G/J TUBE W/ FLUORO
1 series · 4 of 4 positions shown · non-contrast
Comparison: Fluoroscopic guided gastrojejunostomy tube
exchange-04/30/2020

INDICATION: History of inadvertent removal of feeding gastrojejunostomy tube for
which patient underwent fluoroscopic guided replacement on
04/30/2020 however at that time, the patient is desired
gastrojejunostomy tube was not available.

As such, patient presents today for fluoroscopic guided exchange of
existing GJ tube to her desired GJ tube.
EXAM:
FLUOROSCOPIC GUIDED REPLACEMENT OF GASTROJEJUNOSTOMY TUBE

[Series 300: tube placements · 4 of 4 slices shown]
[im 1/4]
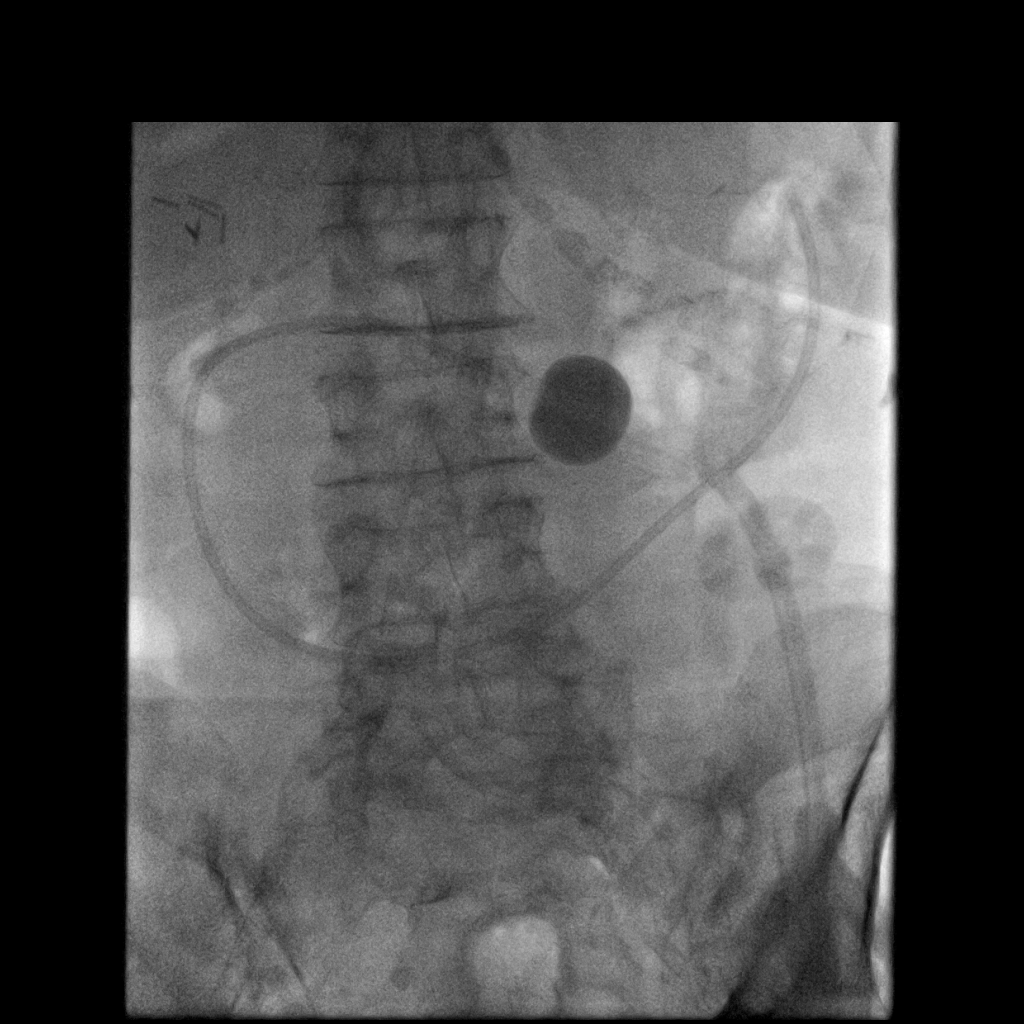
[im 2/4]
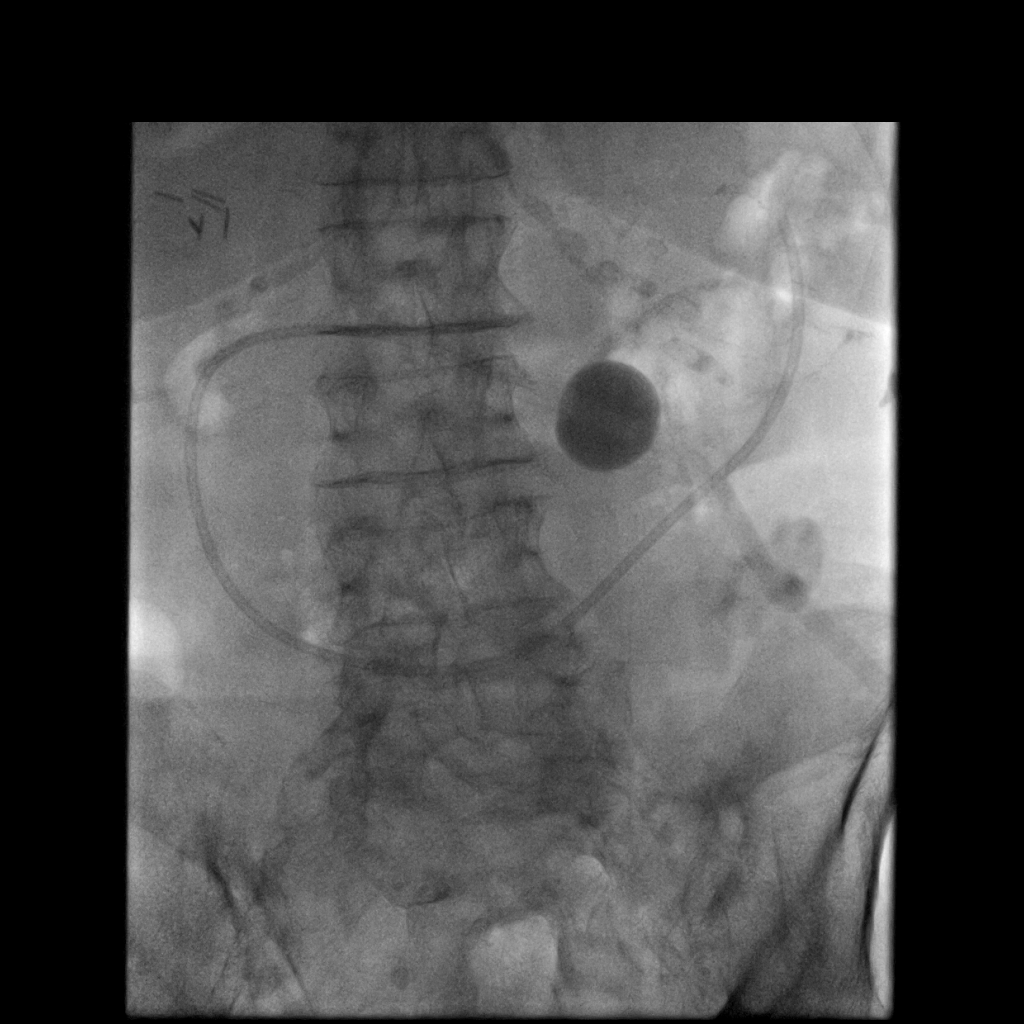
[im 3/4]
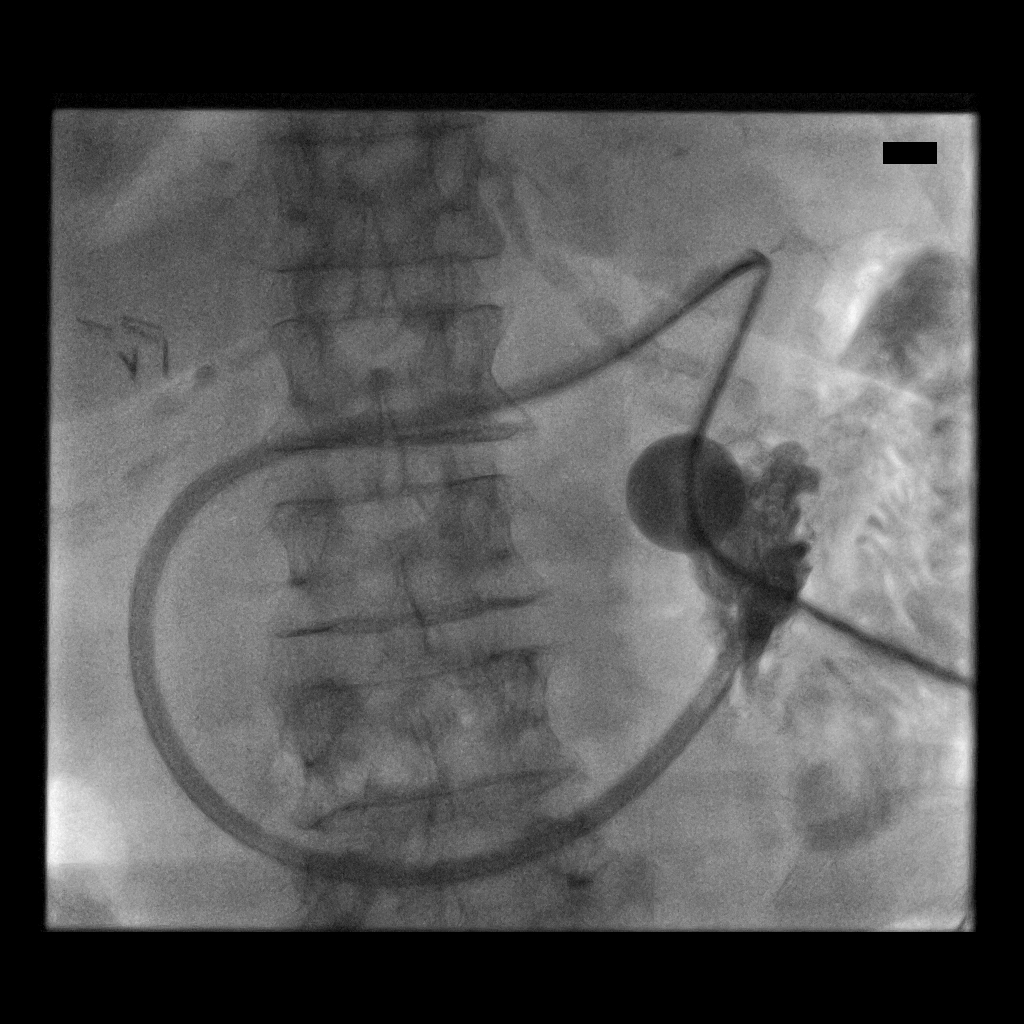
[im 4/4]
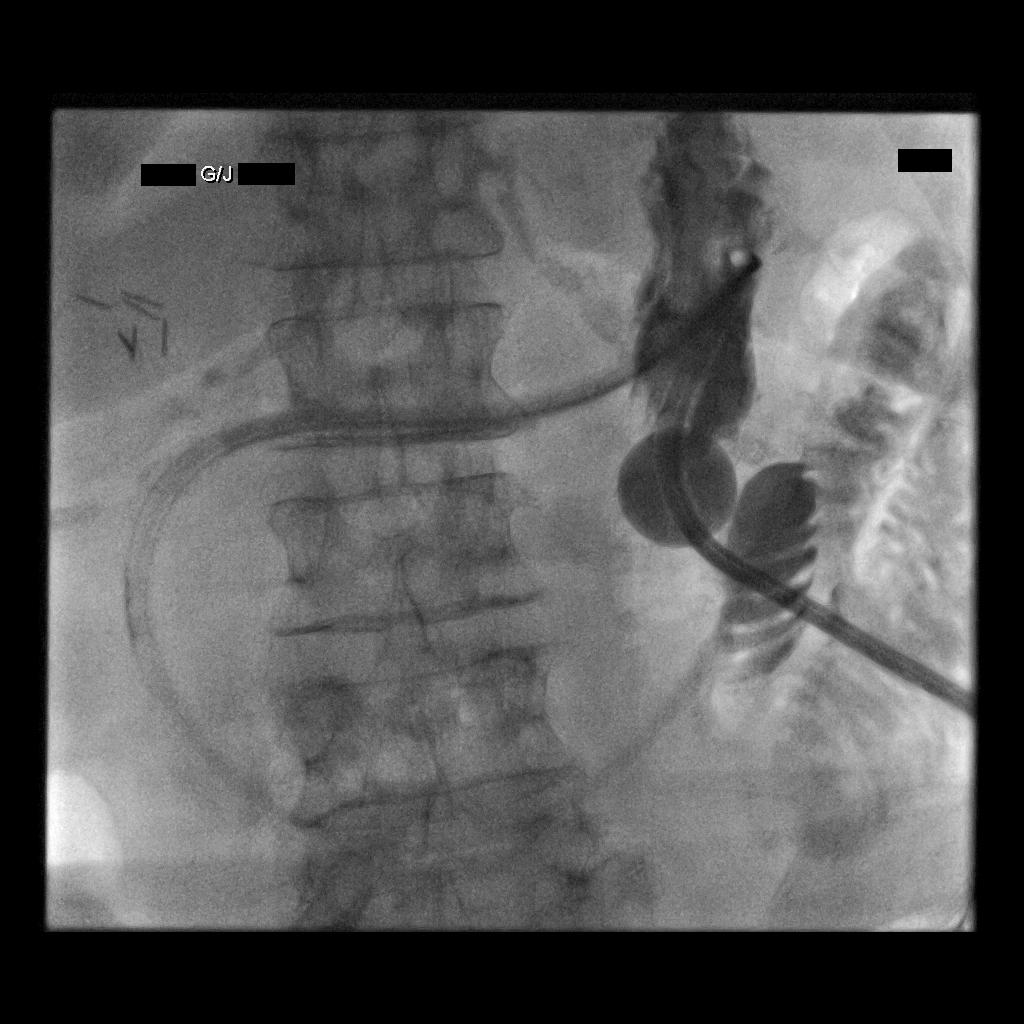

[4 of 4 positions shown; findings below may reference images not displayed]

MEDICATIONS:
None.

CONTRAST:  20mL OMNIPAQUE IOHEXOL 300 MG/ML SOLN - administered into
the gastric lumen and proximal small bowel.

FLUOROSCOPY TIME:  1 minute, 6 seconds (7 mGy)

COMPLICATIONS:
None.

PROCEDURE:
Informed written consent was obtained from the patient after a
discussion of the risks and benefits. The upper abdomen and the
external portion of the existing gastrojejunostomy tube was prepped
and draped in the usual sterile fashion, and a sterile drape was
applied covering the operative field. Maximum barrier sterile
technique with sterile gowns and gloves were used for the procedure.
A timeout was performed prior to the initiation of the procedure.

Contrast injection demonstrated appropriate position functionality
of the pre-existing gastrojejunostomy tube.

External portion of the GJ tube was cut allowing for deflation of
the retention balloon. The tube was slightly retracted and again
trimmed. A stiff Glidewire was cannulated within the remaining
jejunal limb and advanced to the level of the proximal small bowel.

Under intermittent fluoroscopic guidance, the remainder of the GJ
tube was exchanged for a new Infit 22 French GJ tube with tip
ultimately terminating within the proximal jejunum.

Contrast injection via the jejunostomy and gastric lumens confirmed
appropriate functioning and positioning.

A dressing was applied. The patient tolerated procedure well without
immediate postprocedural complication.
IMPRESSION: Successful fluoroscopic guided exchange patient's desired Infit 22
French gastrojejunostomy tube. The tip of the jejunostomy lumen lies
within the proximal jejunum. Both lumens ready for immediate use.

## 2022-12-07 IMAGING — CR DG ABDOMEN 1V
1 series · 1 of 1 positions shown · non-contrast
Comparison: 04/29/2020

CLINICAL DATA: Left upper quadrant abdominal pain and distension.

EXAM:
ABDOMEN - 1 VIEW

[t abdomen supine]
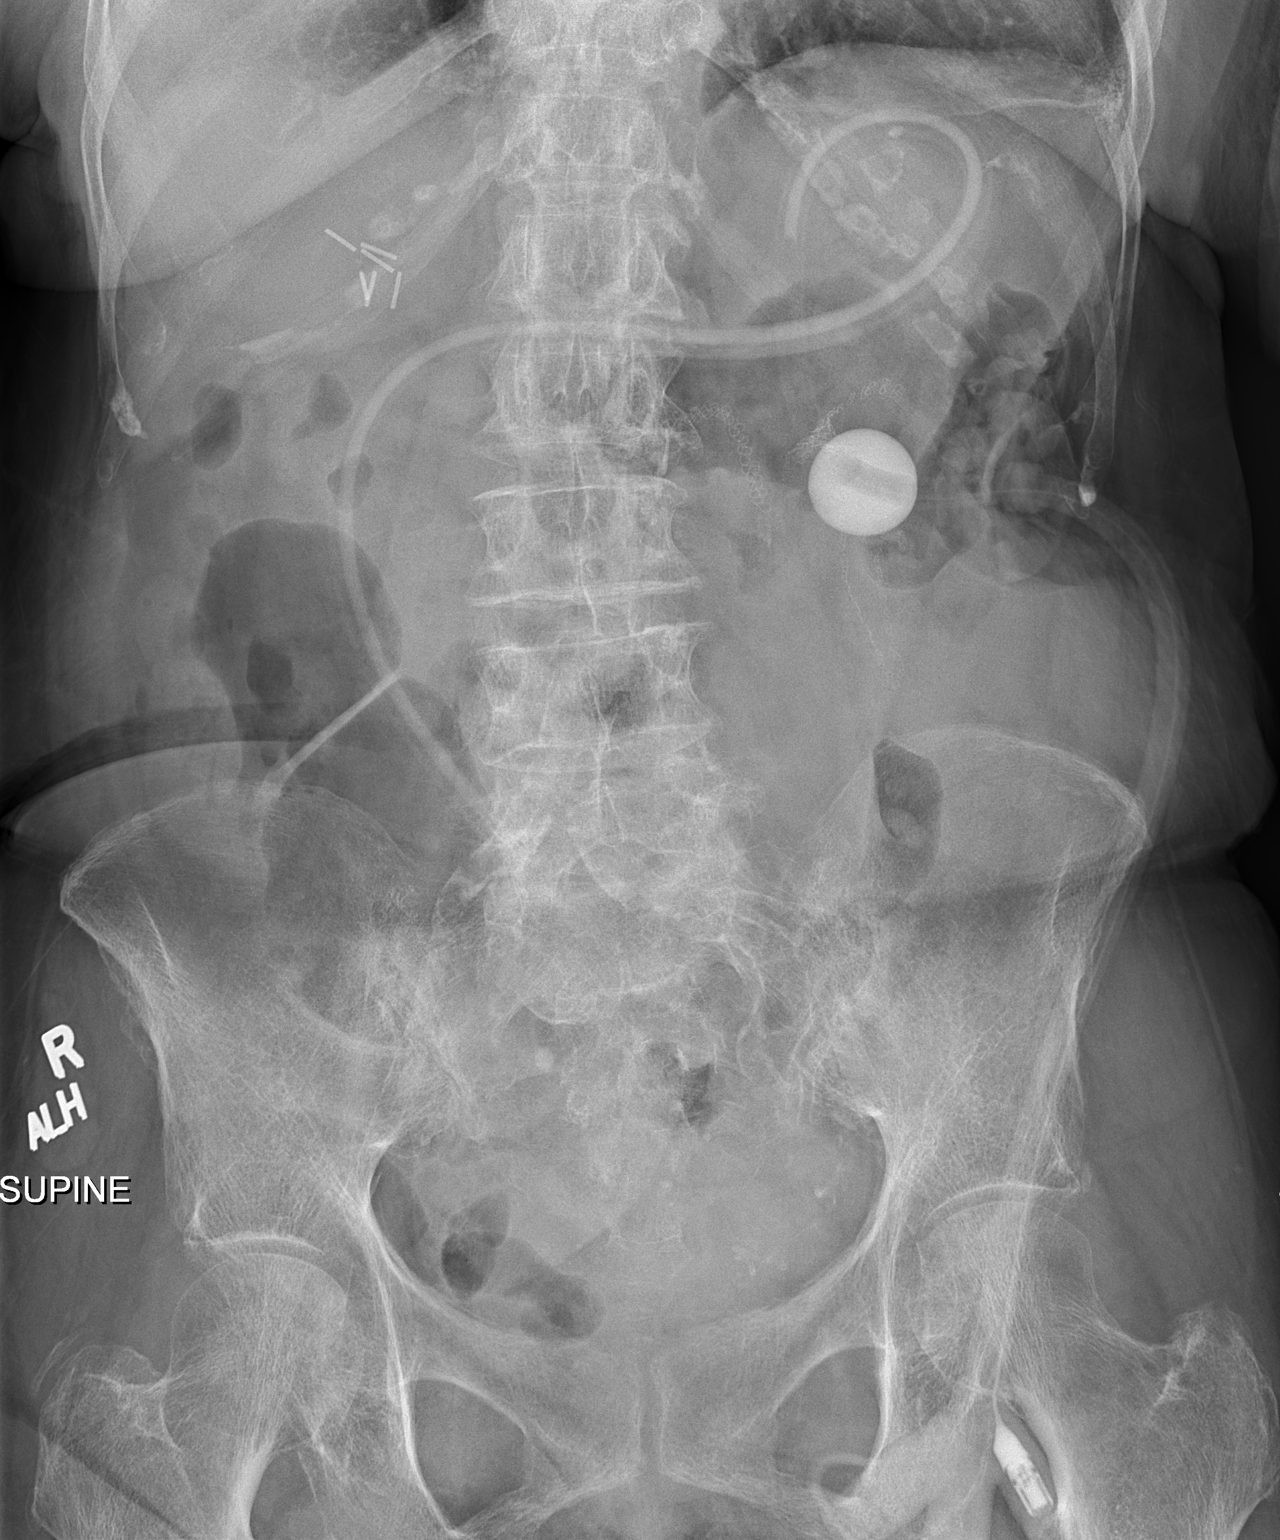

[1 of 1 positions shown; findings below may reference images not displayed]

FINDINGS: The G-tube appears to be in good position in the left upper
quadrant.

The lung bases are grossly clear.

The bowel gas pattern is unremarkable.
IMPRESSION: G-tube in good position.

Unremarkable bowel gas pattern.

## 2023-01-28 IMAGING — CR DG TIBIA/FIBULA 2V*L*
4 series · 4 of 4 positions shown · non-contrast
Comparison: None.

CLINICAL DATA: Leg pain and cellulitis, initial encounter

EXAM:
LEFT TIBIA AND FIBULA - 2 VIEW

[tibia ap (1 of 2)]
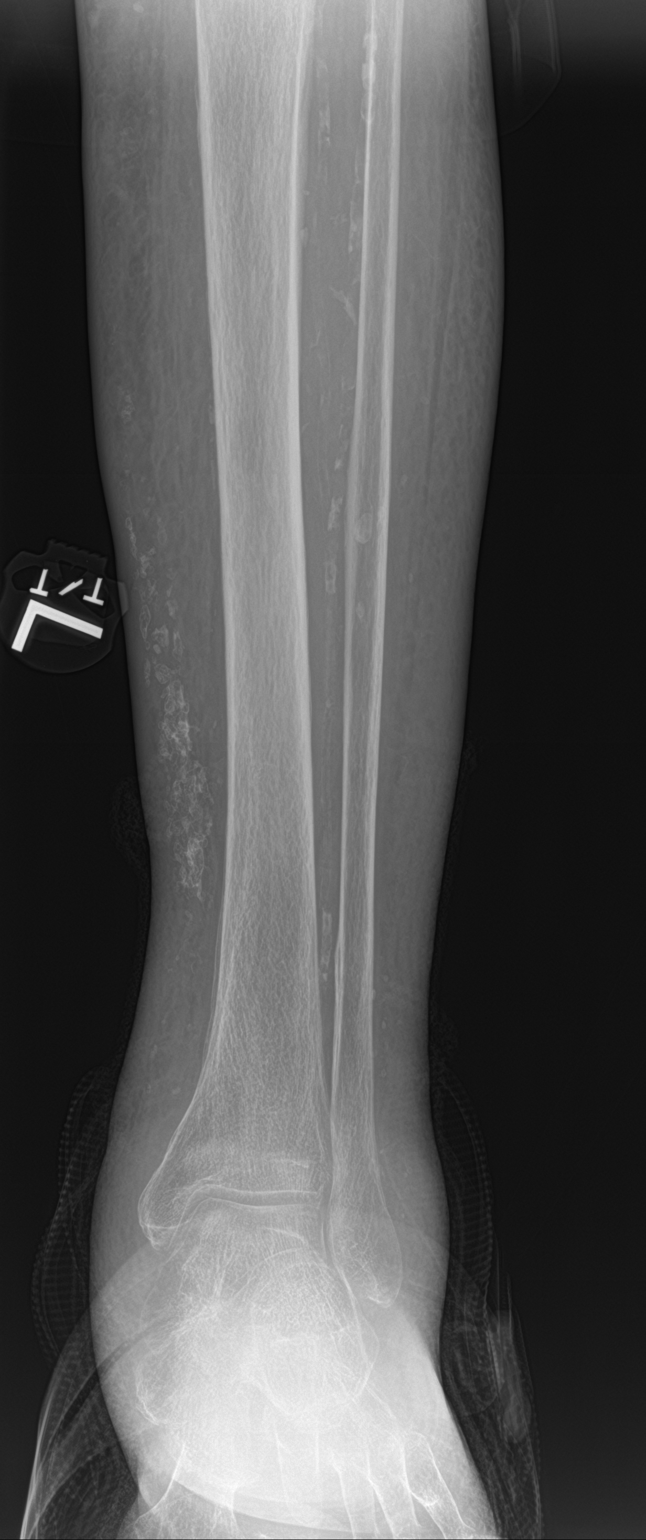

[tibia ap (2 of 2)]
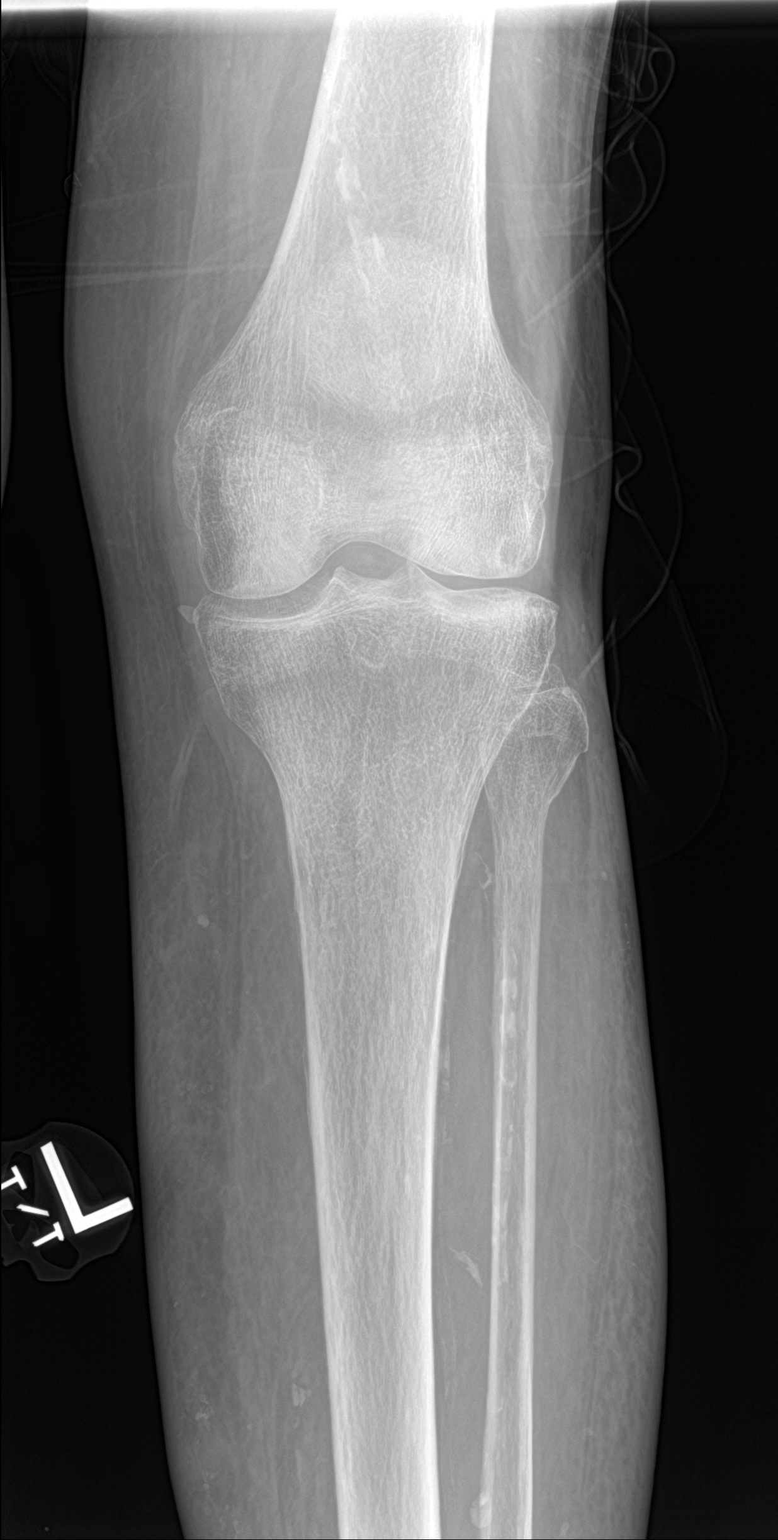

[tibia lat (1 of 2)]
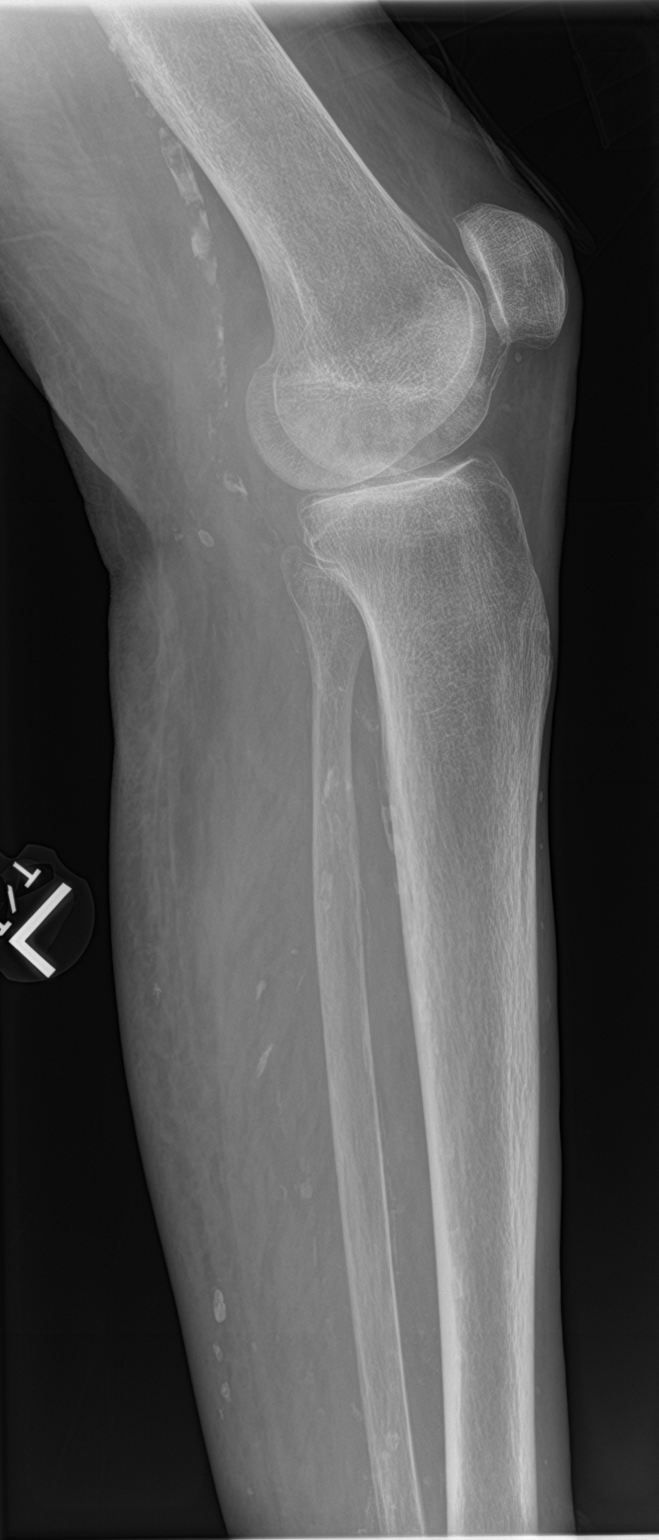

[tibia lat (2 of 2)]
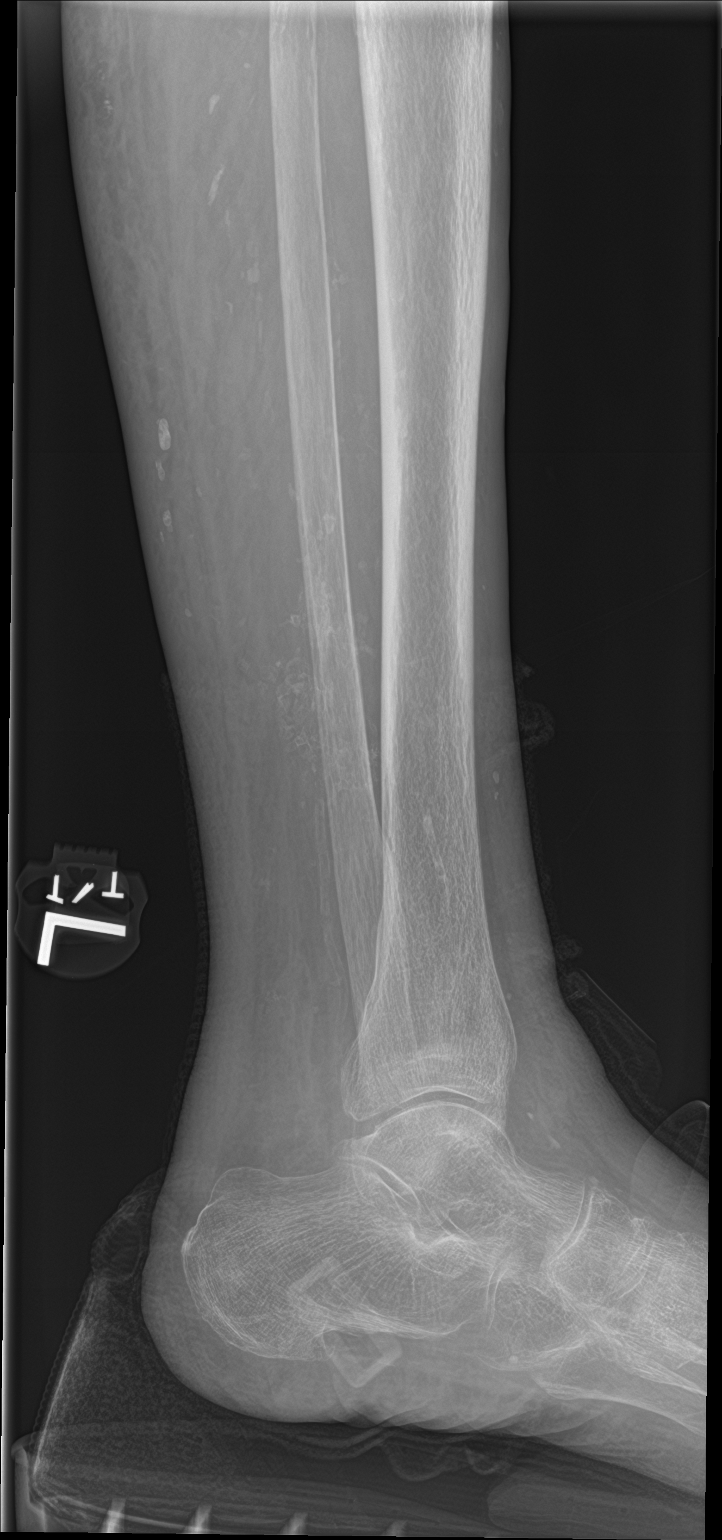

[4 of 4 positions shown; findings below may reference images not displayed]

FINDINGS: Soft tissue swelling is noted about the ankle. No acute fracture or
dislocation is noted. Diffuse vascular calcifications and soft
tissue calcifications are noted.
IMPRESSION: Soft tissue swelling without acute bony abnormality.

## 2023-02-02 ENCOUNTER — Encounter: Payer: Medicare Other | Admitting: Nurse Practitioner

## 2023-03-17 IMAGING — XA IR REPLACE G/J TUBE W/ FLUORO
2 series · 3 of 3 positions shown · non-contrast
Comparison: Multiple previous fluoroscopic guided gastrojejunostomy
catheter exchanges, most recently on 10/05/2020

INDICATION: Chronic feeding gastrojejunostomy tube with current tube
malpositioned with jejunal limb coiled within the stomach.

EXAM:
FLUOROSCOPIC GUIDED REPLACEMENT OF GASTROJEJUNOSTOMY TUBE

[Series 1: fl (-) angio · 1 of 1 slices shown (1 of 2)]
[im 1/1]
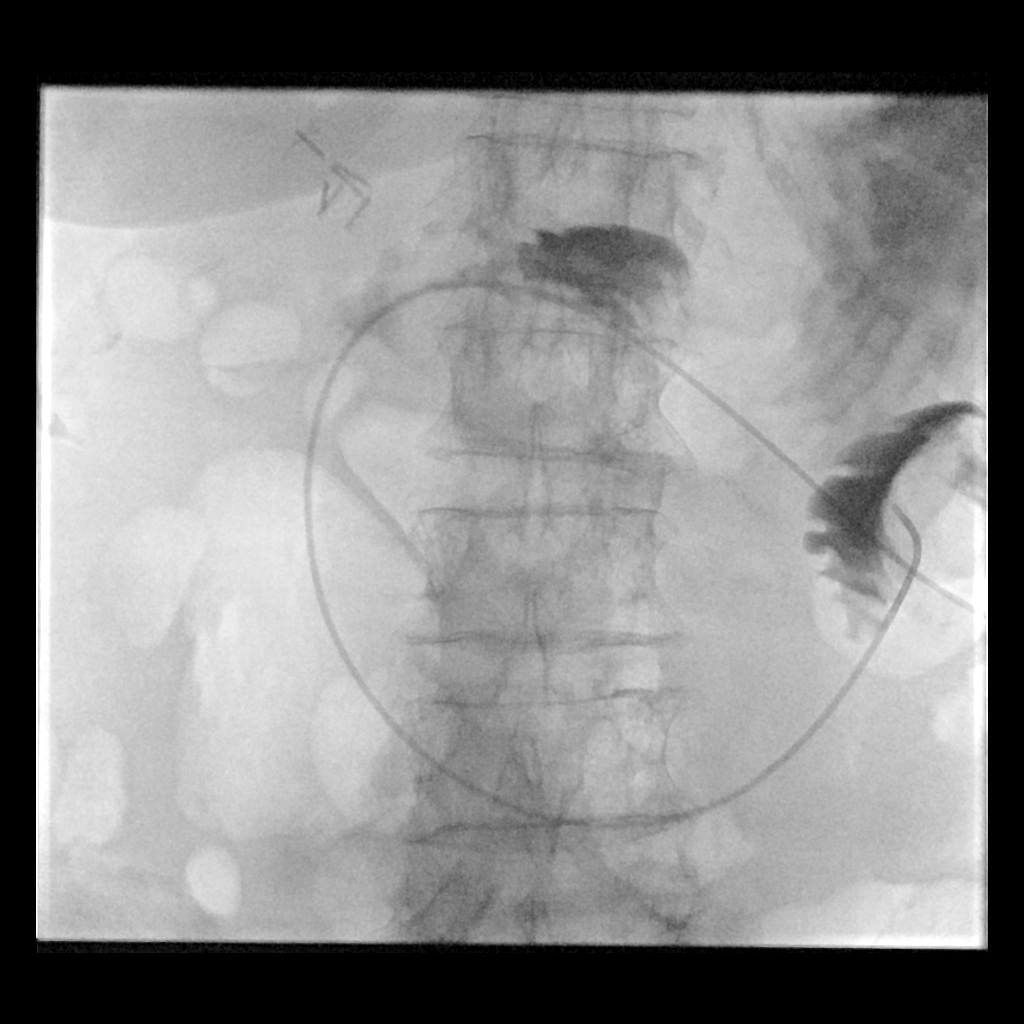

[Series 2: fl (-) angio · 2 of 2 slices shown (2 of 2)]
[im 1/2]
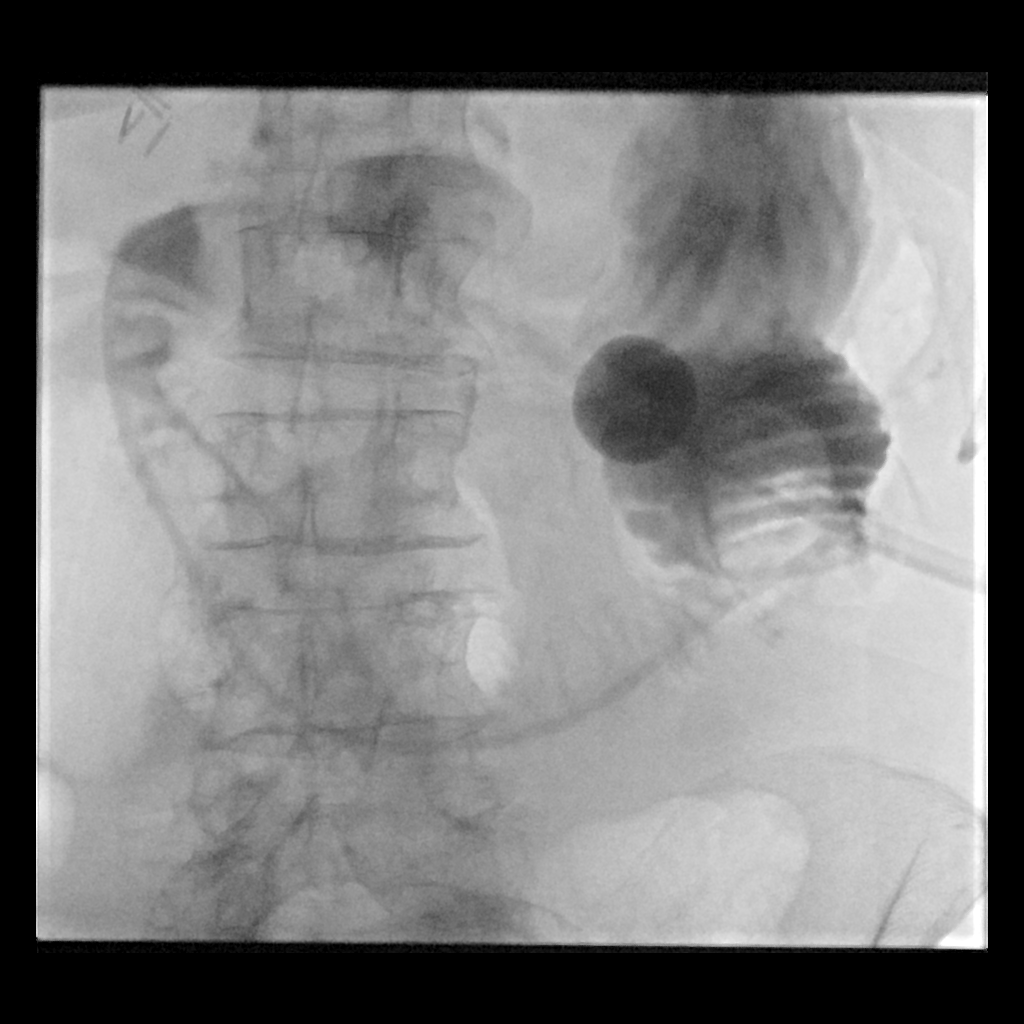
[im 2/2]
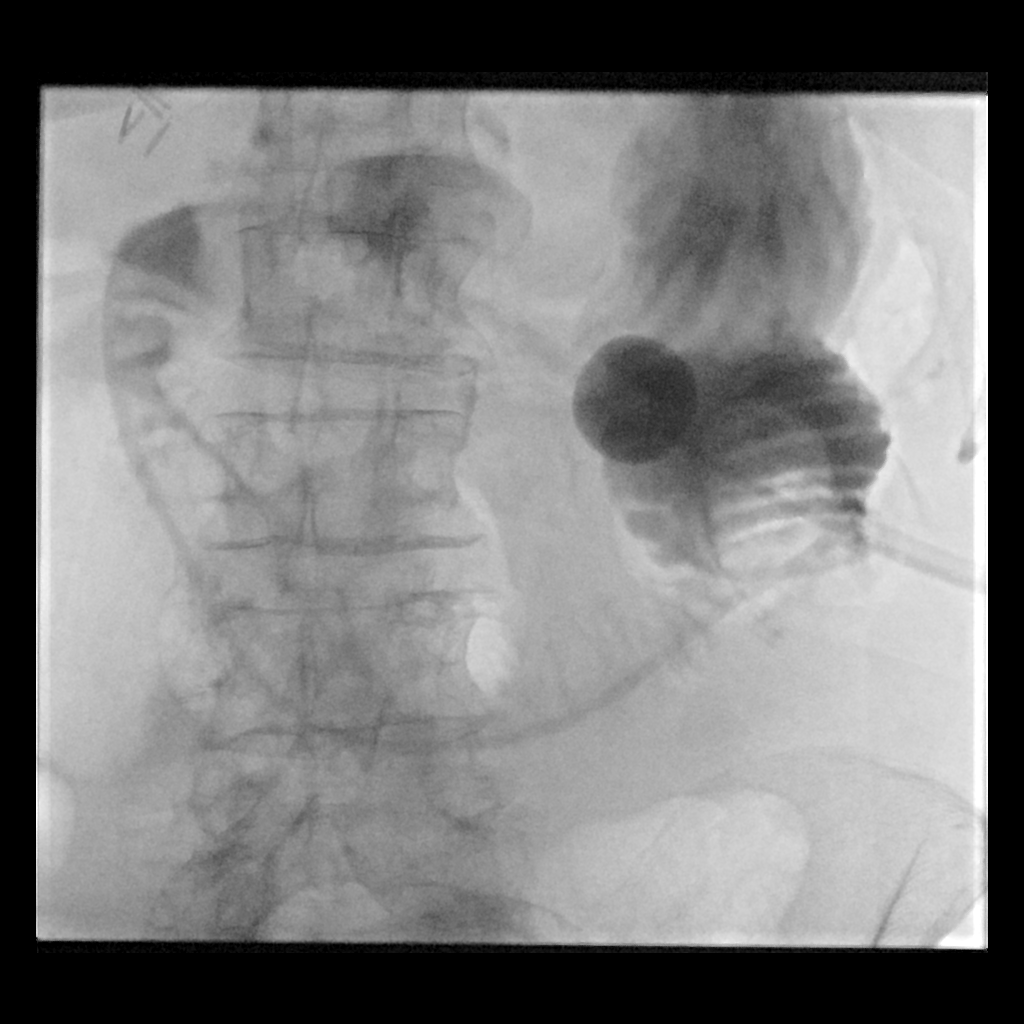

[3 of 3 positions shown; findings below may reference images not displayed]

MEDICATIONS:
None.

CONTRAST:  15 cc Omnipaque 300, administered into the gastric lumen
and proximal small bowel.

FLUOROSCOPY TIME:  2 minutes, 12 seconds (6 mGy)

COMPLICATIONS:
None.

PROCEDURE:
Informed written consent was obtained from the patient after a
discussion of the risks and benefits. The upper abdomen and the
external portion of the existing gastrojejunostomy tube was prepped
and draped in the usual sterile fashion, and a sterile drape was
applied covering the operative field. Maximum barrier sterile
technique with sterile gowns and gloves were used for the procedure.
A timeout was performed prior to the initiation of the procedure.

Preprocedural spot fluoroscopic image was obtained of the upper
abdomen demonstrates malpositioning of the jejunal limb, coiled
within the gastric lumen.

The external portion of the GJ tube was cut, the retention balloon
was deflated and the GJ tube was removed intact.

Next, with the use of a stiff glidewire, a Kumpe catheter was
advanced through the gastric antrum to the level of the proximal
small bowel. Contrast injection confirmed appropriate positioning.

Next, under intermittent fluoroscopic guidance, the Kumpe catheter
was exchanged for a new 22 French balloon retention
gastrojejunostomy tube with tip ultimately terminating within the
proximal small bowel. Contrast injection via the jejunostomy and
gastric lumens confirmed appropriate functioning and positioning.

A dressing was applied. The patient tolerated procedure well without
immediate postprocedural complication.
IMPRESSION: Successful fluoroscopic guided replacement of 22 French balloon
retention gastrojejunostomy tube. The tip of the jejunostomy lumen
lies within the proximal jejunum. Both lumens ready for immediate
use.

## 2023-03-22 IMAGING — XA IR REPLACE G/J TUBE W/ FLUORO
4 series · 13 of 14 positions shown · non-contrast
Comparison: 10/14/2020

INDICATION: [AGE] female with chronic indwelling gastrojejunostomy tube
presenting with leaking.

EXAM:
FLUOROSCOPIC GUIDED REPLACEMENT OF GASTROJEJUNOSTOMY TUBE

[Series 2: fl - angio · 3 of 15 frames shown (1 of 3)]
[frame 3/15]
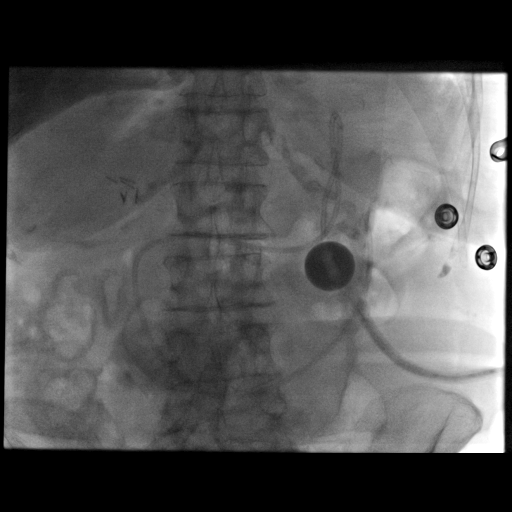
[frame 8/15]
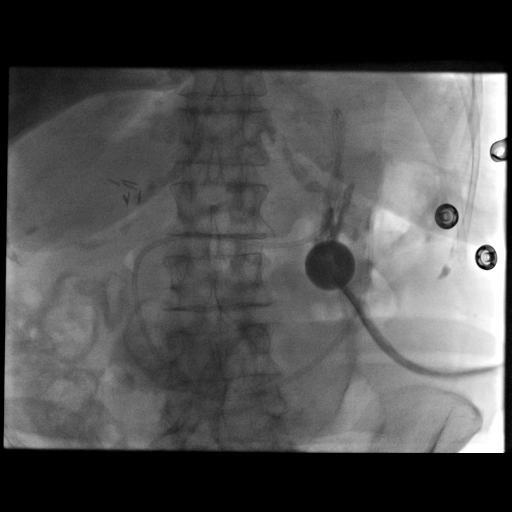
[frame 13/15]
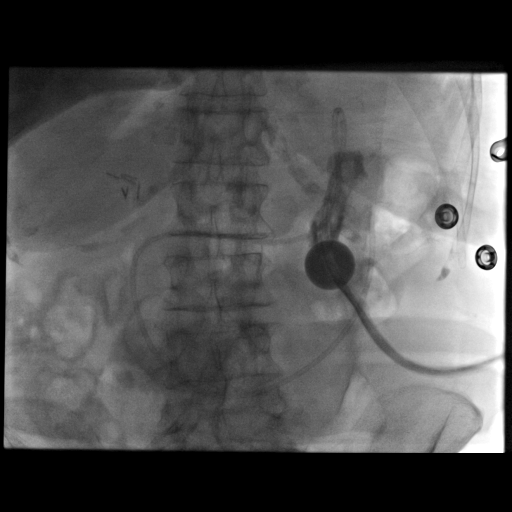

[Series 3: fl - angio · 3 of 13 frames shown (2 of 3)]
[frame 2/13]
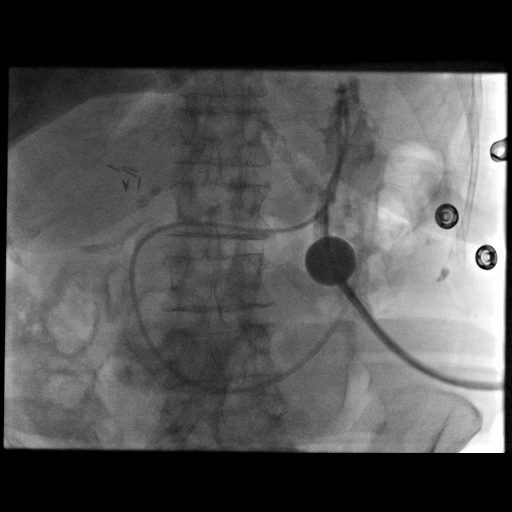
[frame 7/13]
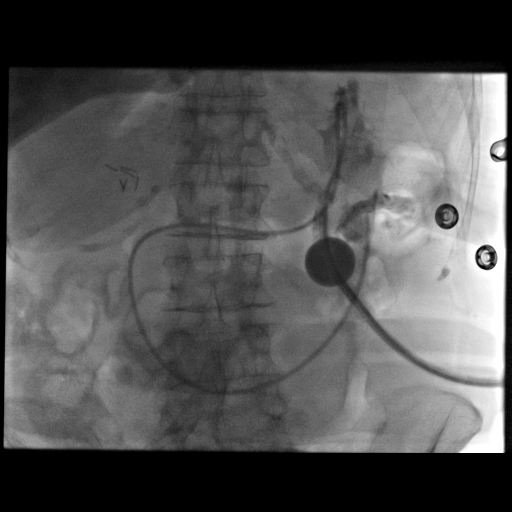
[frame 8/13]
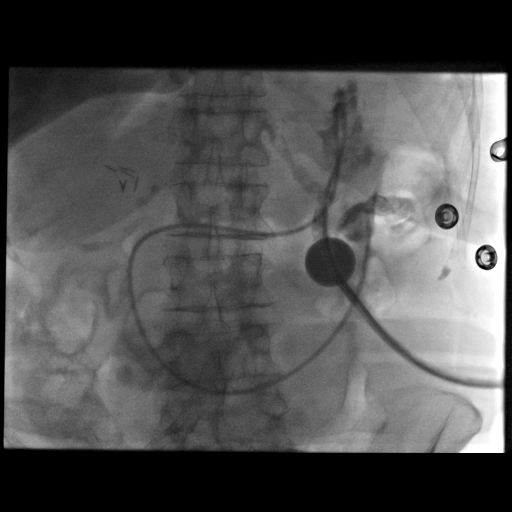

[Series 5: fl - angio · 4 of 17 frames shown (3 of 3)]
[frame 3/17]
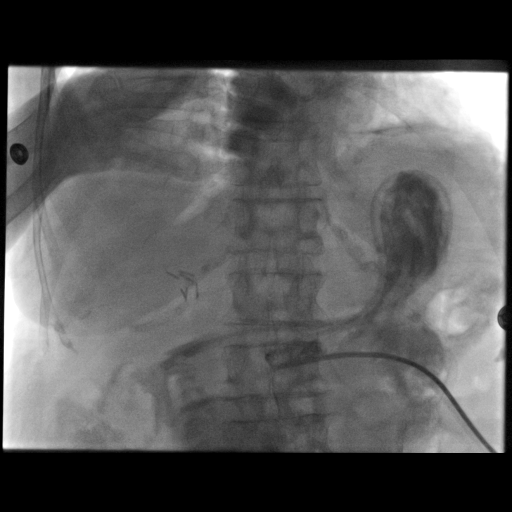
[frame 6/17]
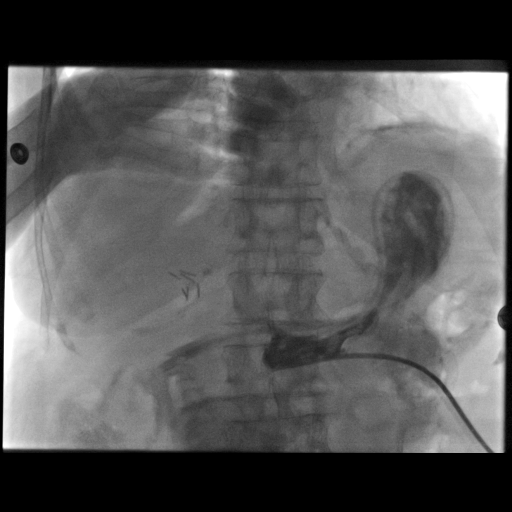
[frame 9/17]
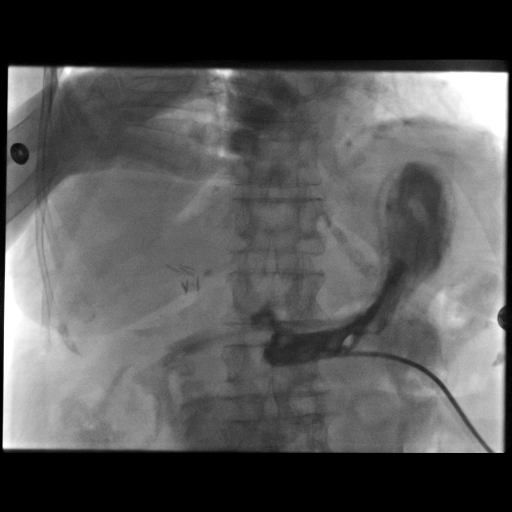
[frame 15/17]
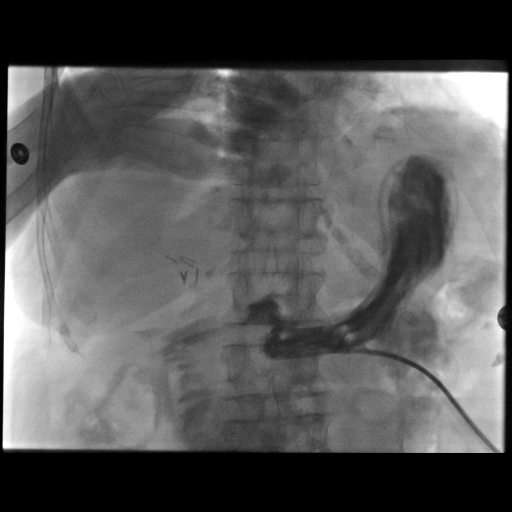

[Series 300: tube placements · 3 of 3 slices shown]
[im 1/3]
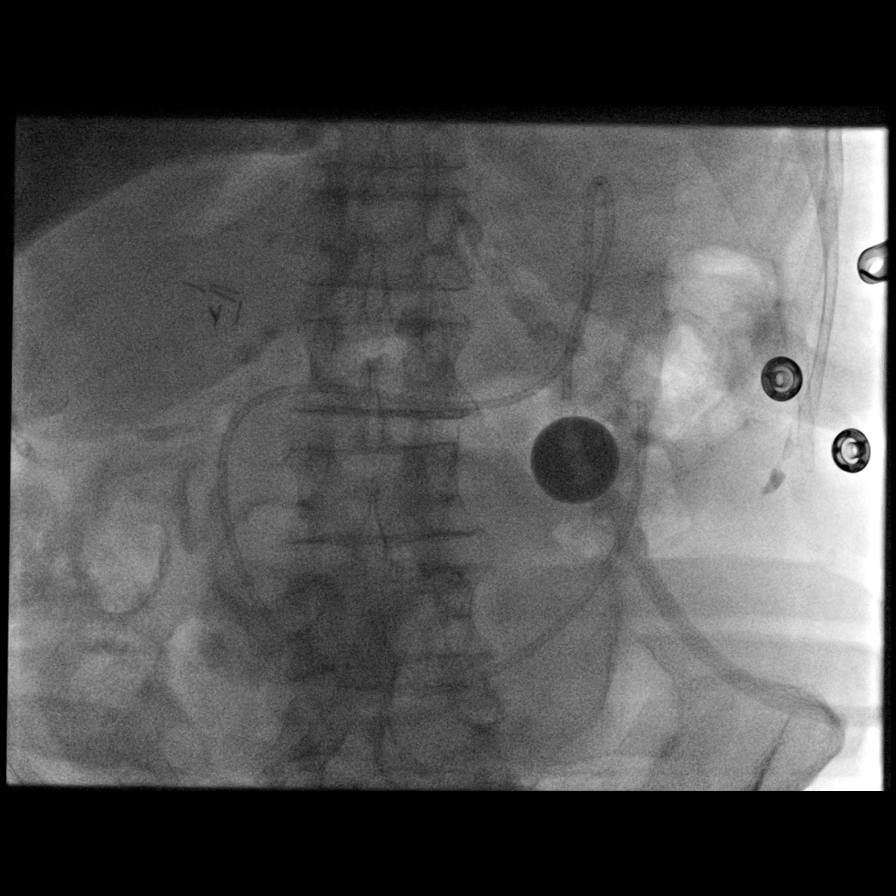
[im 2/3]
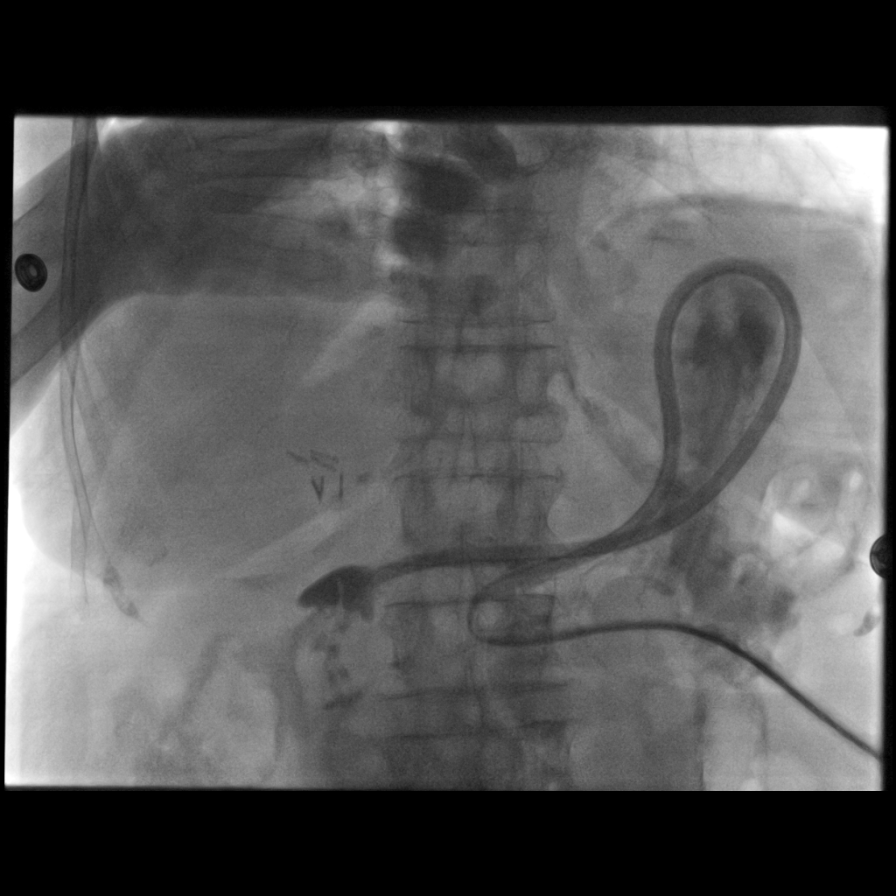
[im 3/3]
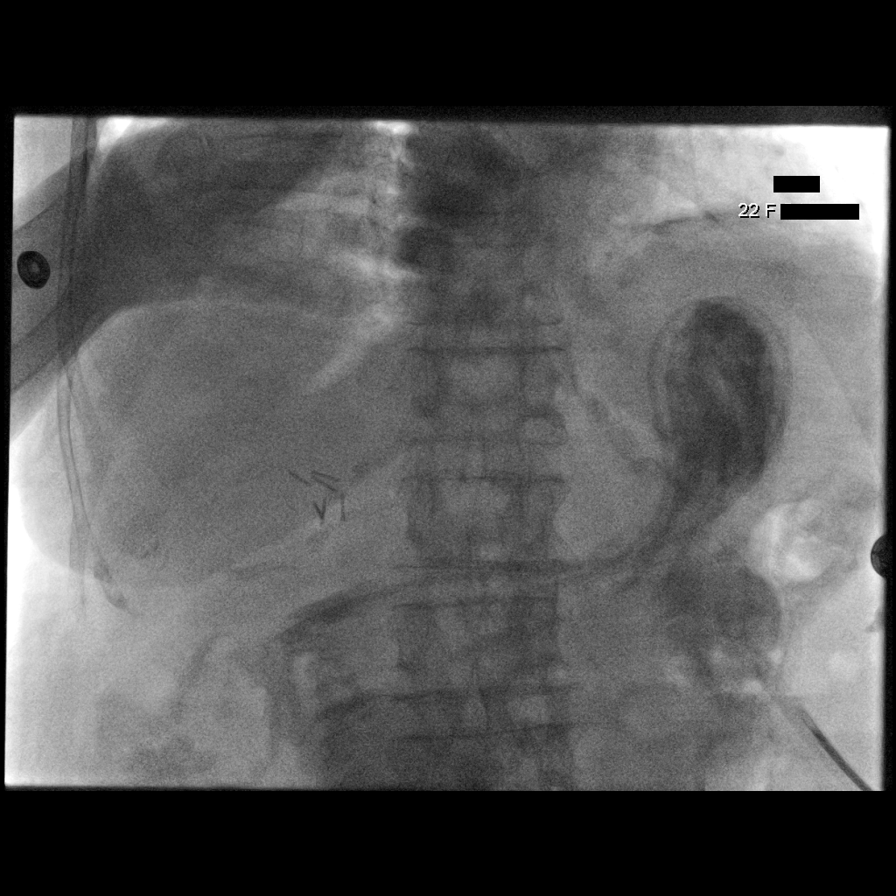

[13 of 14 positions shown; findings below may reference images not displayed]

MEDICATIONS:
None.

CONTRAST:  35mL OMNIPAQUE IOHEXOL 300 MG/ML  SOLN

FLUOROSCOPY TIME:  0 minutes 54 seconds, 5

COMPLICATIONS:
None.

PROCEDURE:
Informed written consent was obtained from the patient after a
discussion of the risks and benefits. The upper abdomen and the
external portion of the existing gastrojejunostomy tube was prepped
and draped in the usual sterile fashion, and a sterile drape was
applied covering the operative field. Maximum barrier sterile
technique with sterile gowns and gloves were used for the procedure.
A timeout was performed prior to the initiation of the procedure.

Hand injection of contrast via the jejunal and gastric lumens
demonstrate patency of the indwelling tube. A Glidewire was then
inserted through the jejunal lumen. There was difficulty advancing
passed a kink in the tube within the gastric fundus. The wire was
then able to be passed after insertion of a Kumpe catheter over the
wire. The Glidewire was positioned in proximal jejunum the balloon
deflated, and the indwelling gastrojejunostomy tube was removed. A
new, 22 French gastrojejunostomy tube was then inserted. There was
redundancy within the stomach due to angulation of regional
insertion, with the tip of the jejunal arm in the proximal duodenum.
Contrast injection via the jejunostomy and gastric lumens confirmed
appropriate functioning and positioning.

A dressing was placed. The patient tolerated procedure well without
immediate postprocedural complication.
IMPRESSION: Successful fluoroscopic guided exchange of 22 French
gastrojejunostomy tube.

## 2023-05-24 IMAGING — MR MR FOOT*R* W/O CM
4 of 6 series · 18 of 40 positions shown · non-contrast
Comparison: X-ray 12/13/2020

CLINICAL DATA: Foot pain, chronic, osteoarthritis suspected

EXAM:
MRI OF THE RIGHT FOREFOOT WITHOUT CONTRAST
TECHNIQUE: Multiplanar, multisequence MR imaging of the right forefoot was
performed. No intravenous contrast was administered.

[Series 2: T1 · coronal · 3.0mm · 0.29mm/px · 3 of 43 slices shown (1 of 2)]
[im 5/43]
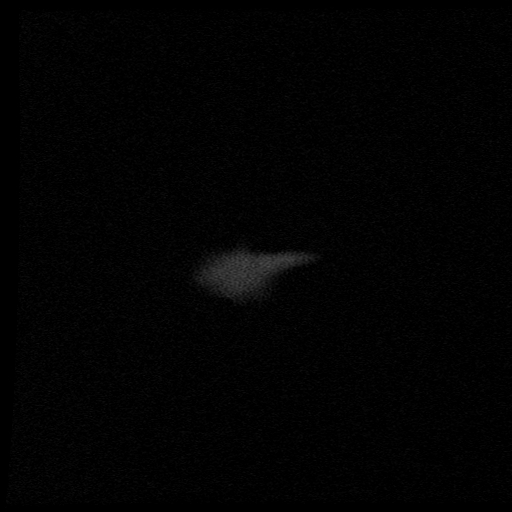
[im 24/43]
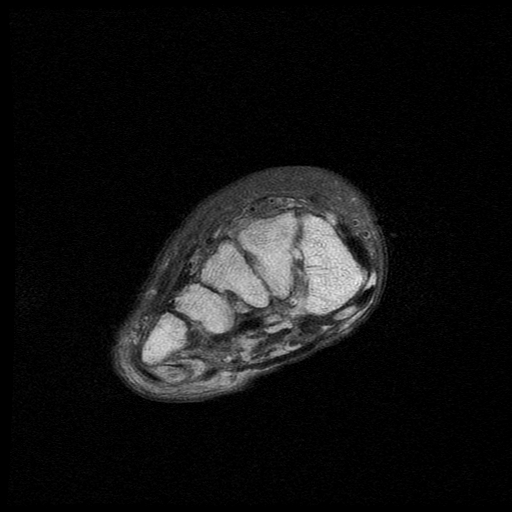
[im 38/43]
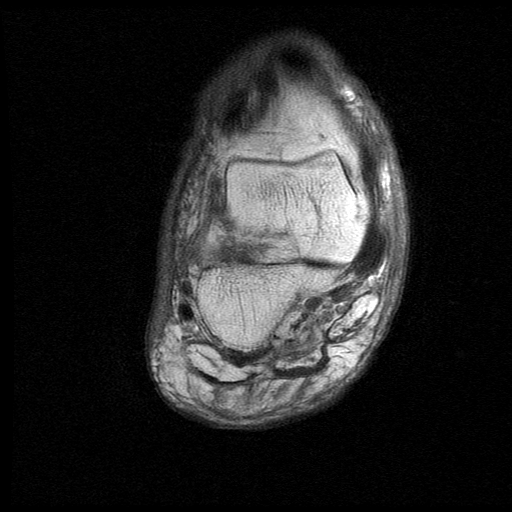

[Series 3: T2 fat-sat · coronal · 3.0mm · 0.29mm/px · 9 of 43 slices shown (1 of 2)]
[im 1/43]
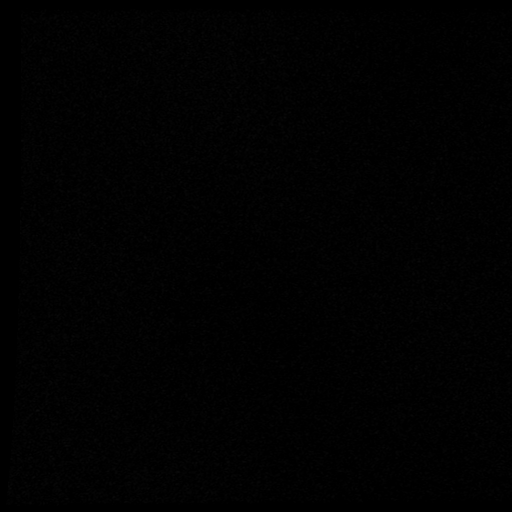
[im 6/43]
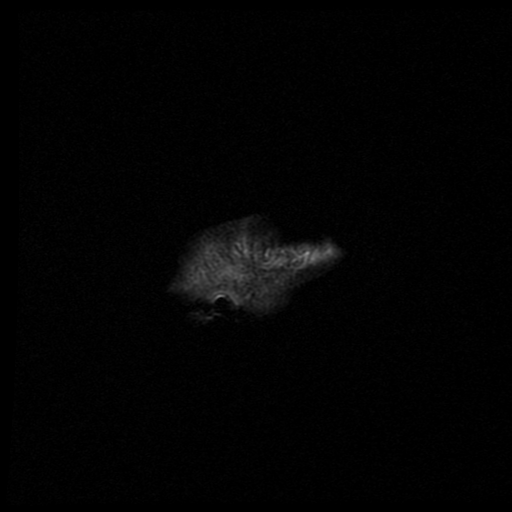
[im 11/43]
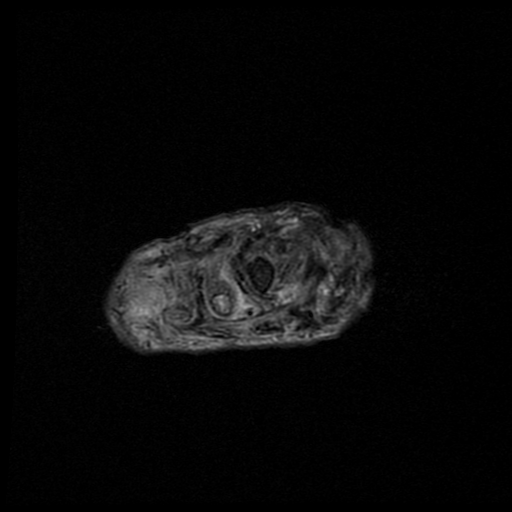
[im 16/43]
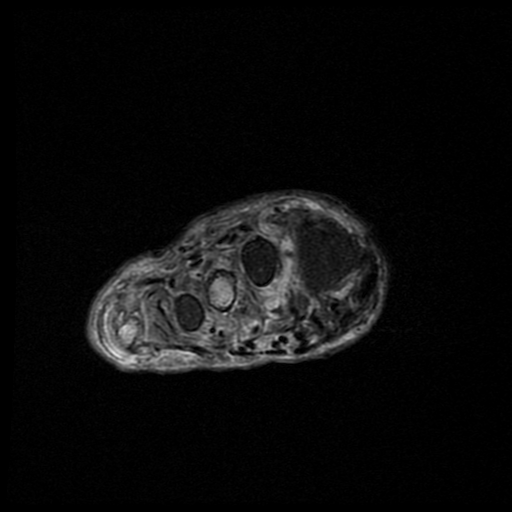
[im 22/43]
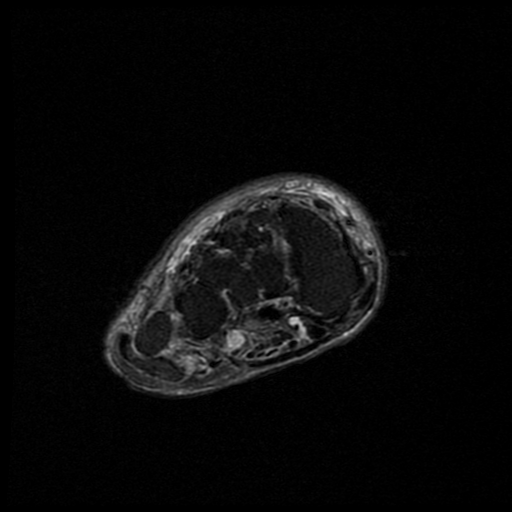
[im 27/43]
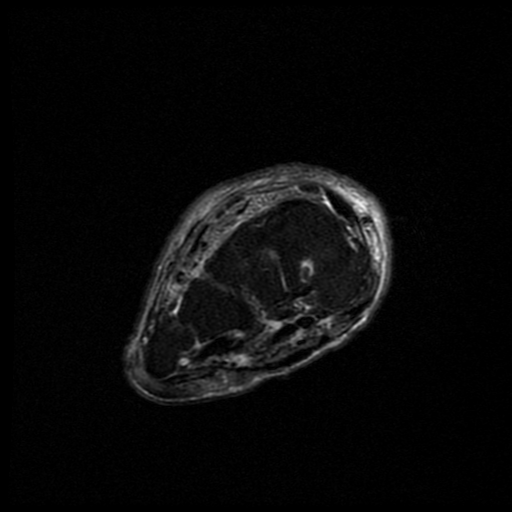
[im 32/43]
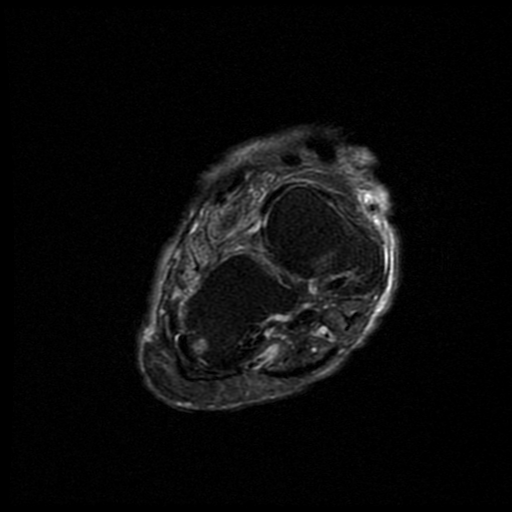
[im 37/43]
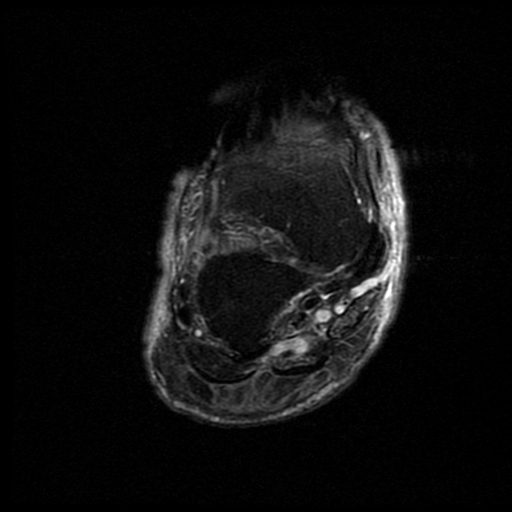
[im 43/43]
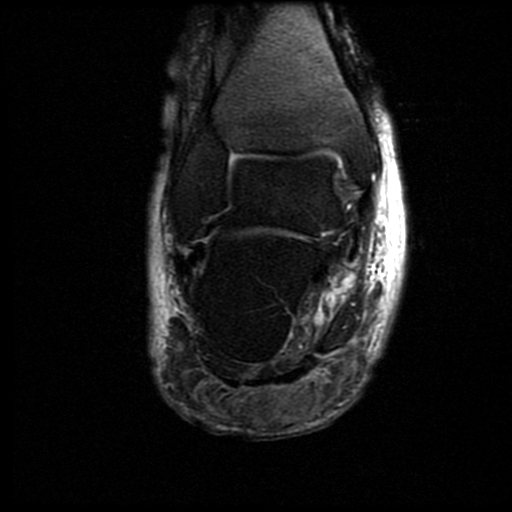

[Series 4: T1 · axial · 3.0mm · 0.29mm/px · z∈[-70,+10]mm · 3 of 23 slices shown (2 of 2)]
[im 1/23]
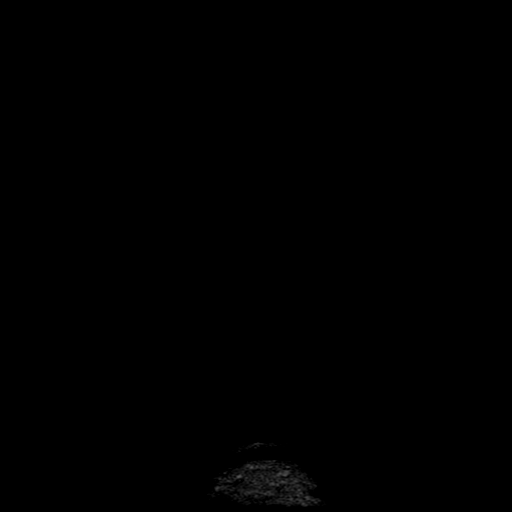
[im 12/23]
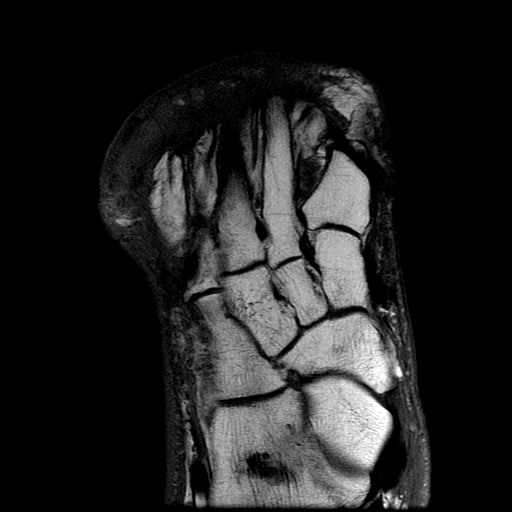
[im 23/23]
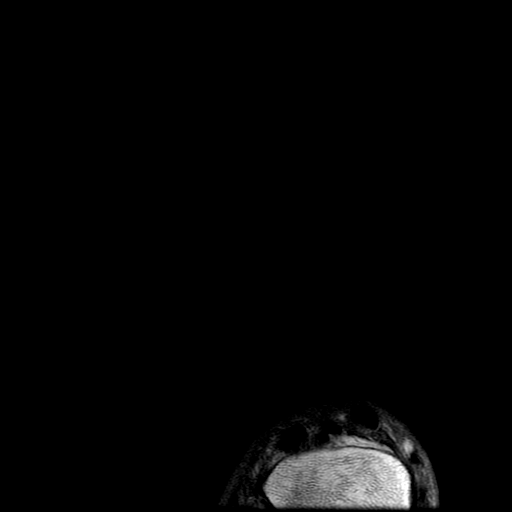

[Series 6: T2 fat-sat · axial · 3.0mm · 0.29mm/px · z∈[-70,+10]mm · 3 of 23 slices shown (2 of 2)]
[im 1/23]
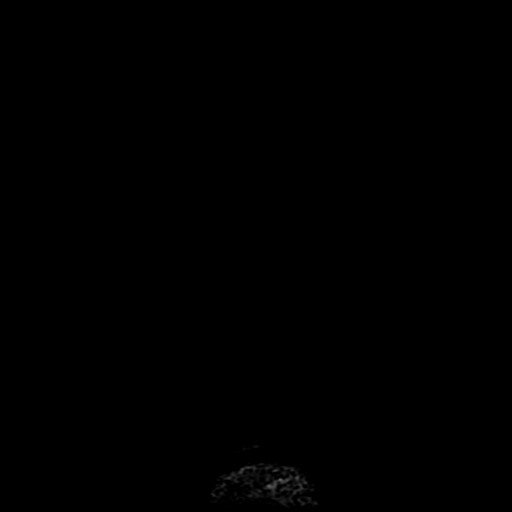
[im 12/23]
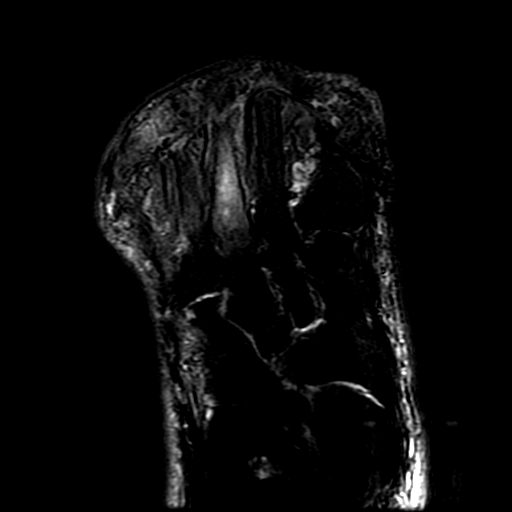
[im 23/23]
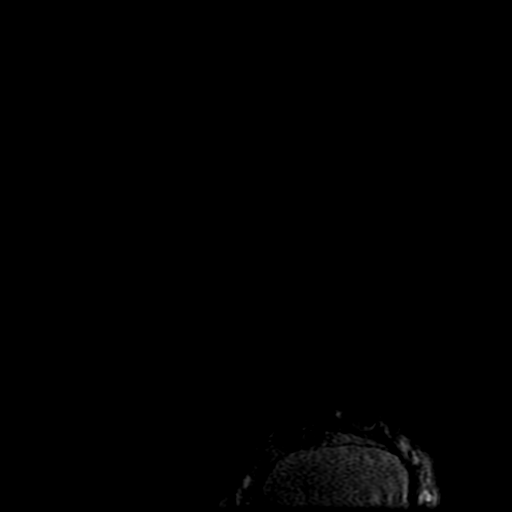

[18 of 40 positions shown; findings below may reference images not displayed]

FINDINGS: Bones/Joint/Cartilage

Patient is status post a transmetatarsal amputation of the first
through fifth rays. Destructive changes at the third metatarsal
resection margin with prominent bone marrow edema extending to the
level of the third metatarsal proximal metaphysis. Intermediate to
low T1 signal to the level of the mid third metatarsal diaphysis,
compatible with acute osteomyelitis.

There is bone marrow edema at the resection margin at the level of
the fifth metatarsal diaphysis concerning for early acute
osteomyelitis. The residual first, second, and fourth metatarsals
are intact without evidence to suggest acute osteomyelitis at this
time. Bony structures of the midfoot and visualized hindfoot remain
intact. No fractures. No malalignment.

Ligaments

Intact Lisfranc ligament.

Muscles and Tendons

Chronic denervation changes of the intrinsic foot musculature.
Diffuse intramuscular edema-like signal may reflect myositis and/or
denervation. Post amputation changes to the musculotendinous
structures of the right forefoot.

Soft tissues

Diffuse soft tissue swelling at the distal stump as well as at the
dorsum of the forefoot. Superficial ulceration at the distal stump
distal to the level of the third metatarsal. No organized or
drainable fluid collections.
IMPRESSION: 1. Acute osteomyelitis of the right third metatarsal.
2. Bone marrow edema at the resection margin of the fifth metatarsal
concerning for early acute osteomyelitis.
3. Diffuse soft tissue swelling at the distal stump as well as at
the dorsum of the forefoot. Superficial ulceration at the distal
stump distal to the level of the third metatarsal. No organized or
drainable fluid collections.

## 2023-05-24 IMAGING — MR MR FOOT*L* W/O CM
4 of 6 series · 18 of 40 positions shown · non-contrast
Comparison: Radiographs from 12/13/2020

CLINICAL DATA: Chronic foot pain

EXAM:
MRI OF THE LEFT FOOT WITHOUT CONTRAST
TECHNIQUE: Multiplanar, multisequence MR imaging of the left forefoot was
performed. No intravenous contrast was administered.

[Series 3: T1 · coronal · 3.0mm · 0.29mm/px · 3 of 43 slices shown (1 of 2)]
[im 6/43]
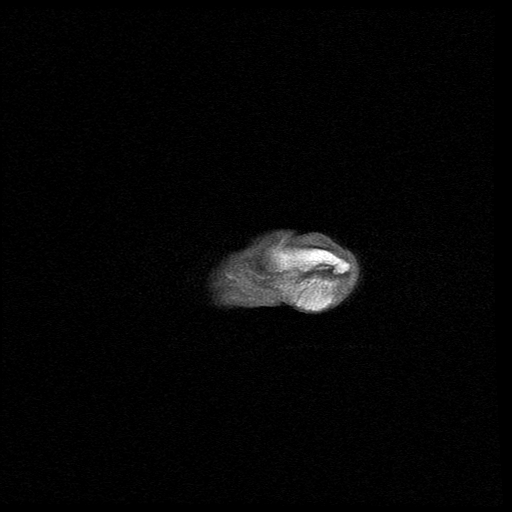
[im 22/43]
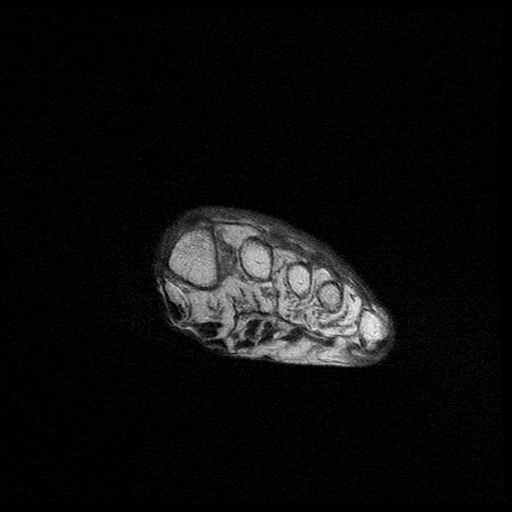
[im 37/43]
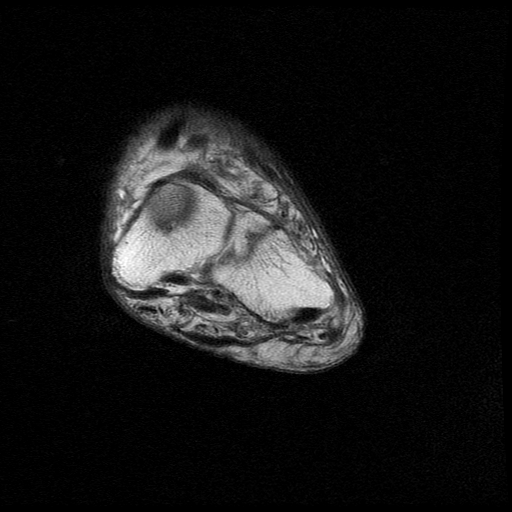

[Series 4: T2 fat-sat · coronal · 3.0mm · 0.29mm/px · 8 of 43 slices shown (1 of 2)]
[im 1/43]
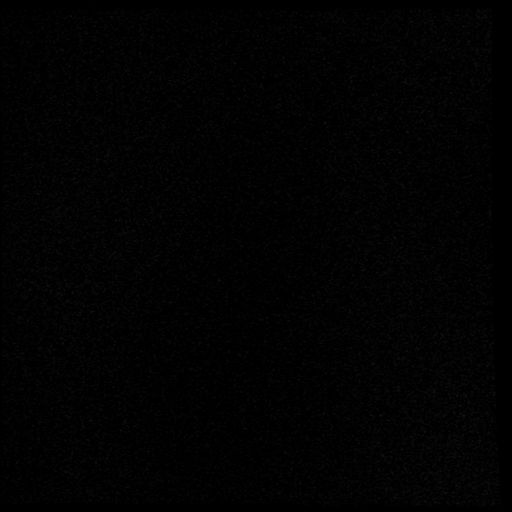
[im 5/43]
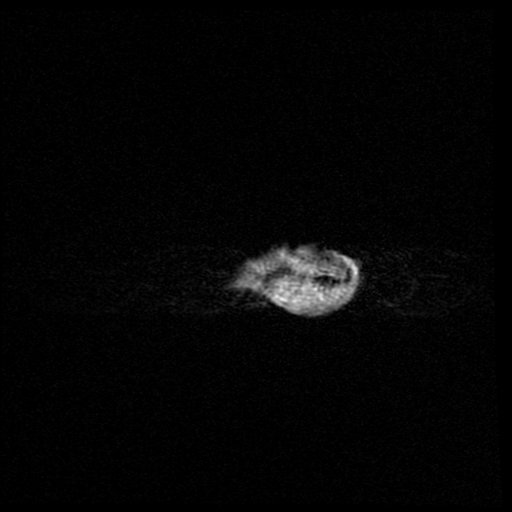
[im 15/43]
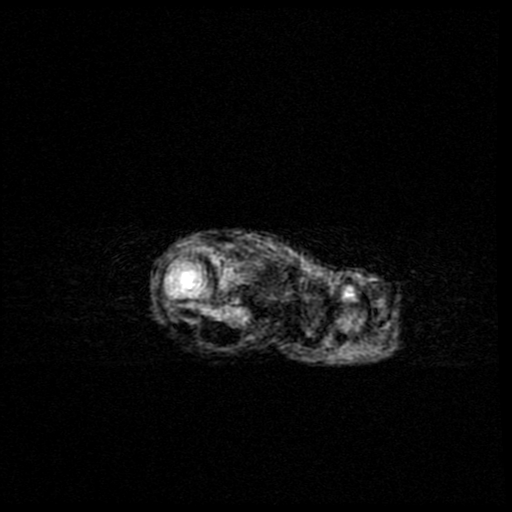
[im 19/43]
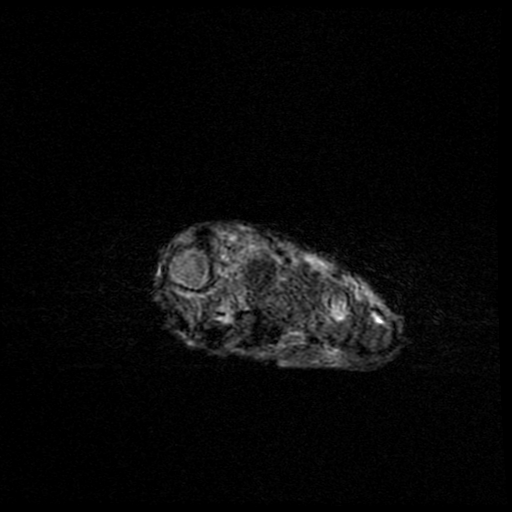
[im 24/43]
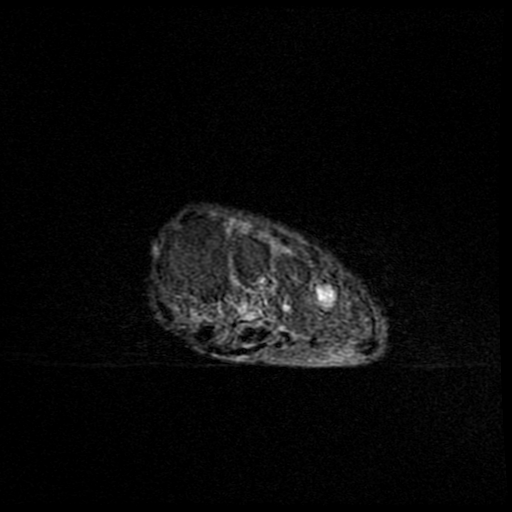
[im 29/43]
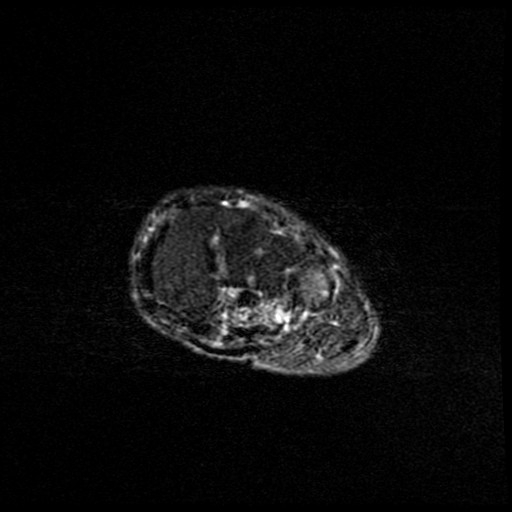
[im 38/43]
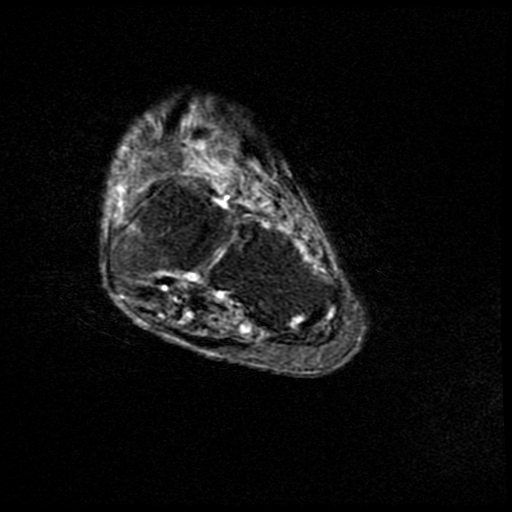
[im 43/43]
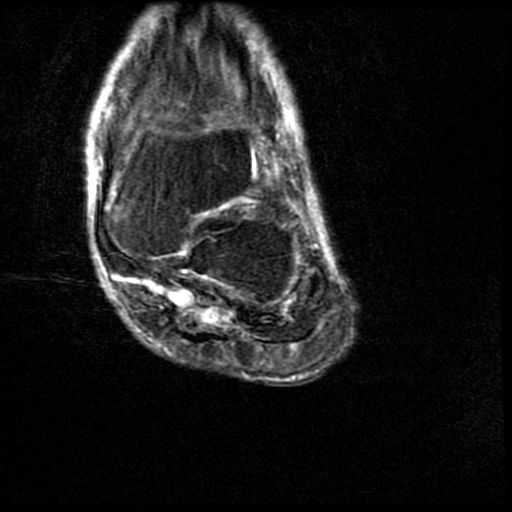

[Series 5: T1 · axial · 3.0mm · 0.29mm/px · z∈[-35,+46]mm · 3 of 22 slices shown (2 of 2)]
[im 1/22]
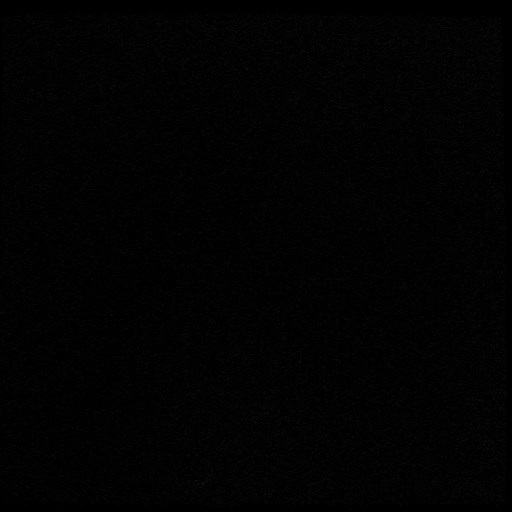
[im 11/22]
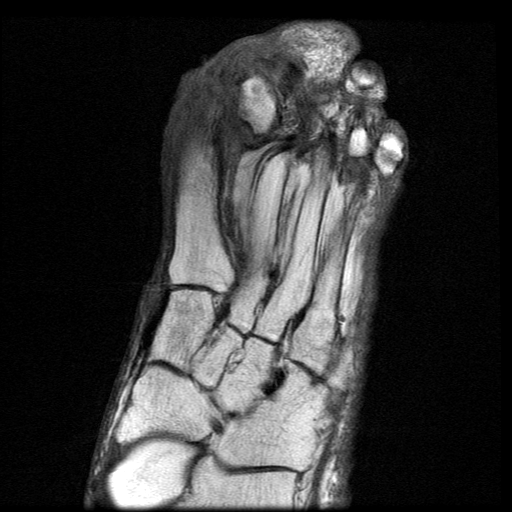
[im 22/22]
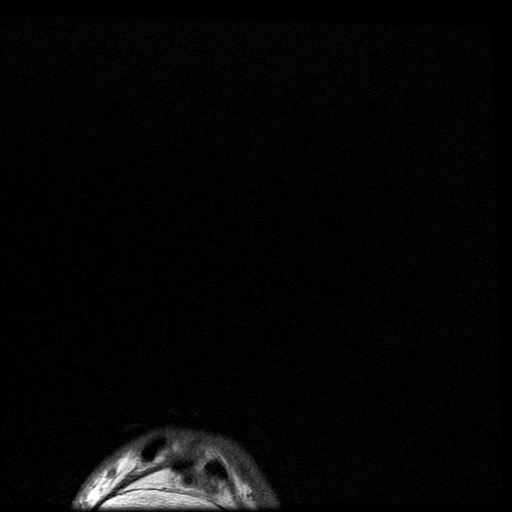

[Series 6: T2 fat-sat · axial · 3.0mm · 0.29mm/px · z∈[-35,+46]mm · 4 of 22 slices shown (2 of 2)]
[im 1/22]
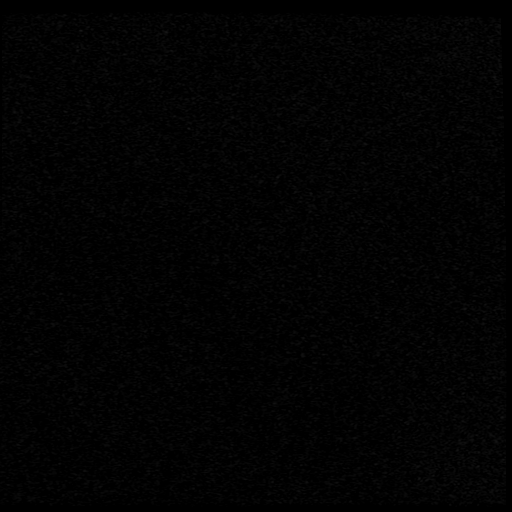
[im 6/22]
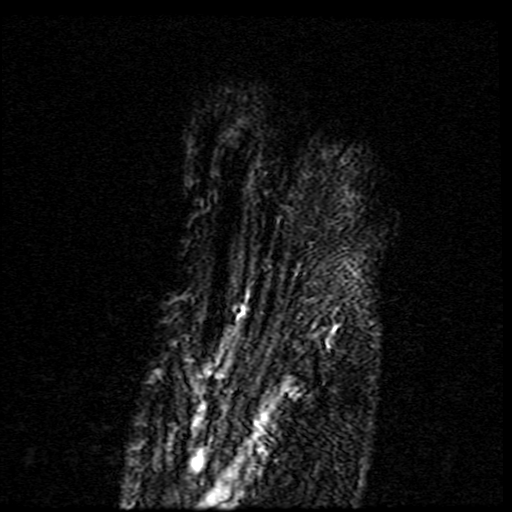
[im 11/22]
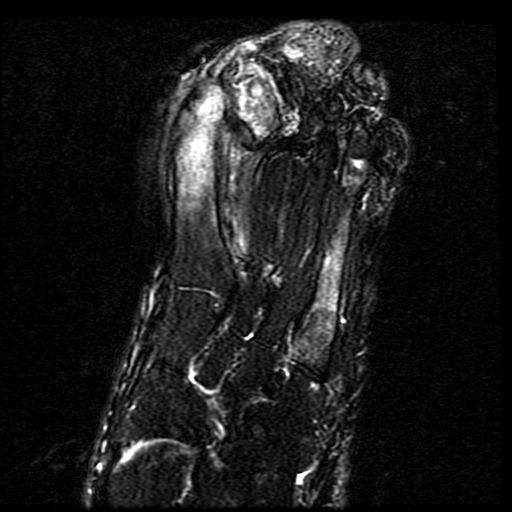
[im 22/22]
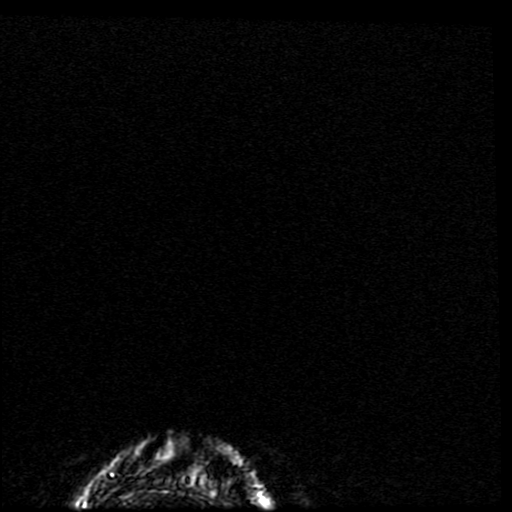

[18 of 40 positions shown; findings below may reference images not displayed]

FINDINGS: Bones/Joint/Cartilage

The second and third toes are absent past the distal metatarsal
level.

There is lateral dislocation of the base of the proximal phalanx of
the great toe with respect to the first metatarsal head, with
erosion and potentially bony destruction of a substantial portion of
the first metatarsal head medially and extensive marrow edema in the
distal half of the first metatarsal. There is also angulation at the
first MTP joint compatible with hallux valgus. There is likewise
marrow edema in the proximal phalanx great toe and in the adjacent
sesamoids, with erosion of the medial base of the proximal phalanx.

Transverse fracture of the distal head of the fourth metacarpal.
Notable surrounding edema tracking all the way back into the base of
the fourth metacarpal. A component of superimposed osteomyelitis is
not excluded.

Faint edema anteriorly in the calcaneus on image 13 series 8,
nonspecific.

Ligaments

The Lisfranc ligament appears intact. Given the dislocation,
ligamentous structures at the first MTP joint are thought to be
insufficient.

Muscles and Tendons

Muscular atrophy in the forefoot.

Soft tissues

Subcutaneous edema in the soft tissues surrounding the first MTP
joint, possibly cellulitis.
IMPRESSION: 1. Dislocation and hallux valgus at the first MTP joint, with bony
erosions particularly in the head of the first metatarsal medially,
but also in the base of the proximal phalanx, with marrow edema in
both of the structures. Although gout can sometimes cause a similar
arthropathy, the appearance is concerning for osteomyelitis and
possibly septic joint.
2. Transverse fracture of the head of the third metatarsal. The
degree of edema throughout the bone is somewhat greater than I would
expect for transverse fracture, and raise the possibility of
superimposed osteomyelitis.
3. The second and third digits appear amputated at the distal
metatarsal level.

## 2023-05-31 IMAGING — CT CT HEAD W/O CM
3 of 4 series · 16 of 47 positions shown, 19 images · non-contrast
Comparison: CT head dated July 07, 2019

CLINICAL DATA: Headache

EXAM:
CT HEAD WITHOUT CONTRAST
TECHNIQUE: Contiguous axial images were obtained from the base of the skull
through the vertex without intravenous contrast.

[Series 3: head 3.0 mpr cor · coronal · 0.33mm/px · 3 of 62 slices shown]
[im 21/62  brain]
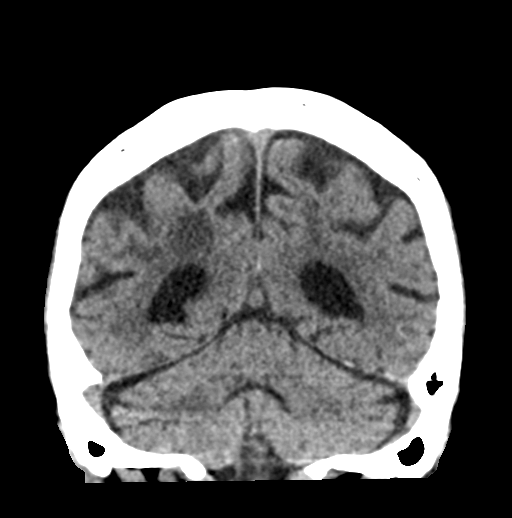
[im 28/62  brain]
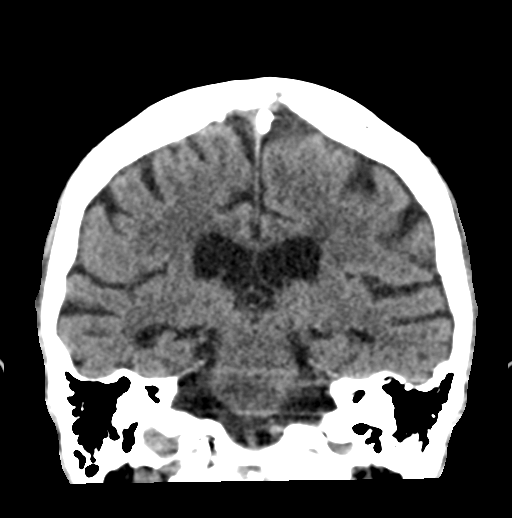
[im 34/62  brain]
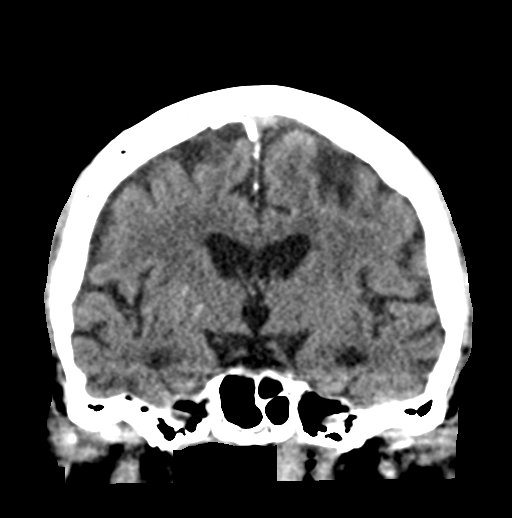

[Series 4: head 3.0 mpr sag · sagittal · 0.34mm/px · 3 of 50 slices shown]
[im 17/50  brain]
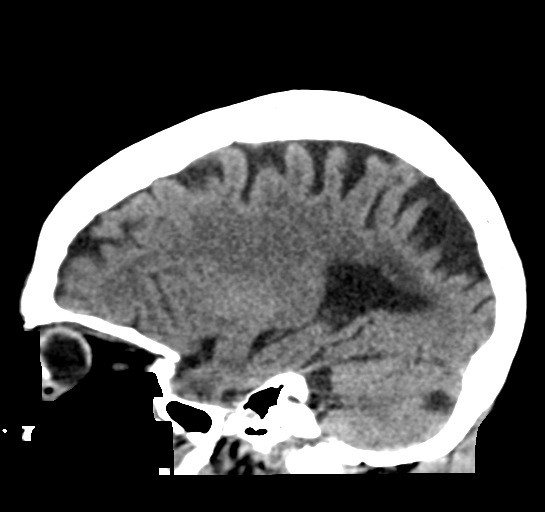
[im 25/50  brain]
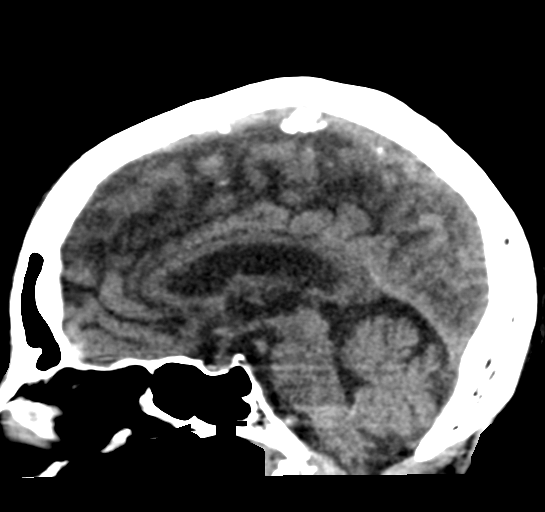
[im 33/50  brain]
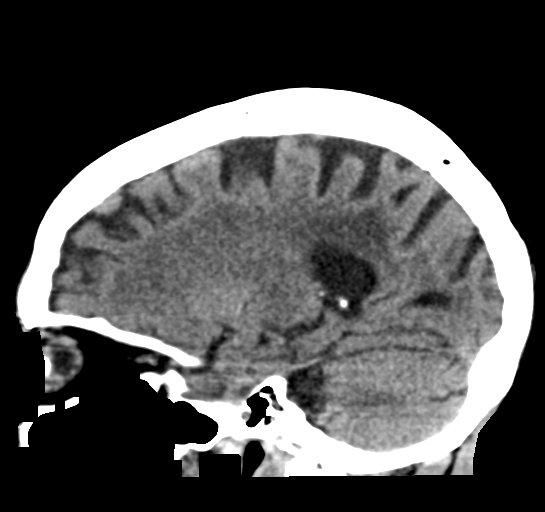

[Series 6: head 2.0 h70h · axial · 0.41mm/px · z∈[-64,+70]mm · 10 of 75 slices shown, 13 images]
[im 4/75  brain]
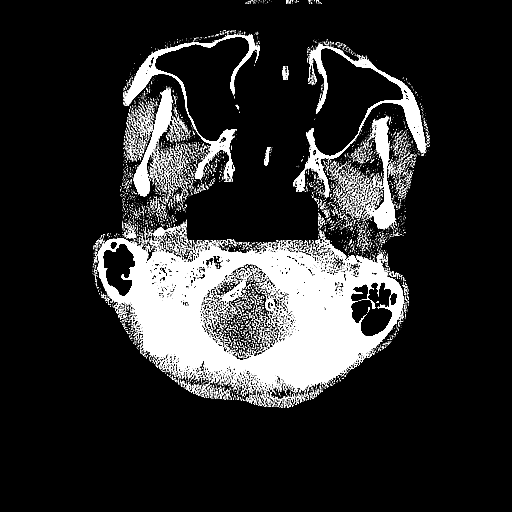
[im 4/75  bone]
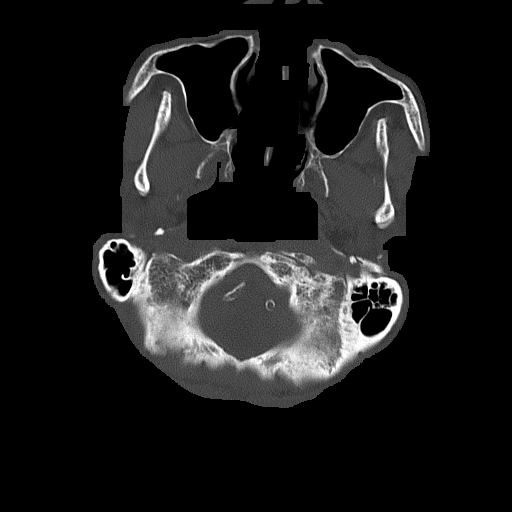
[im 12/75  brain]
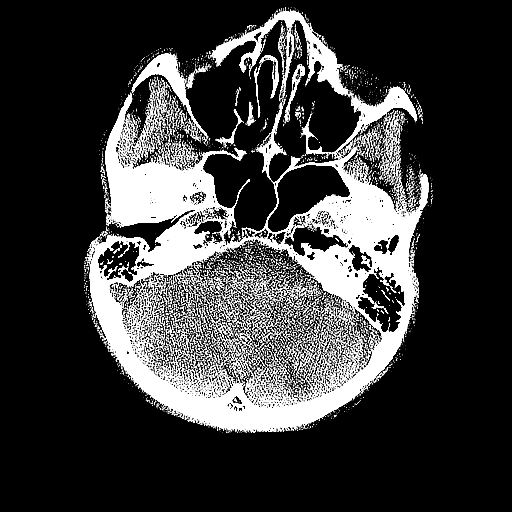
[im 19/75  brain]
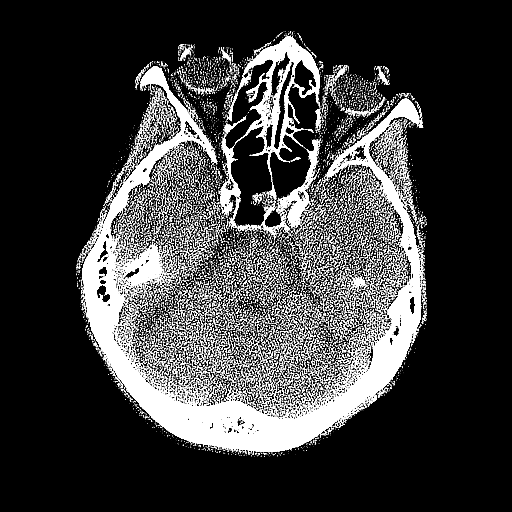
[im 26/75  brain]
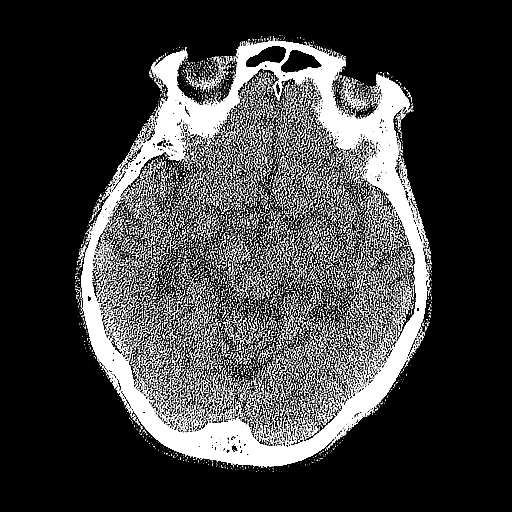
[im 34/75  brain]
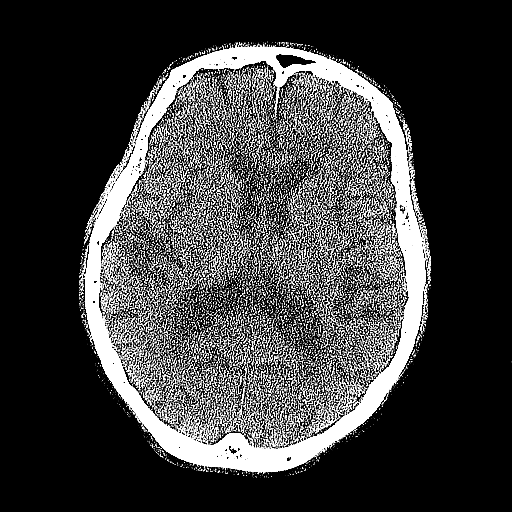
[im 34/75  bone]
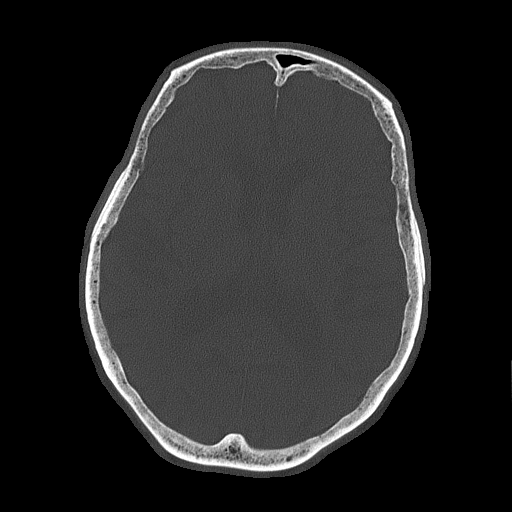
[im 41/75  brain]
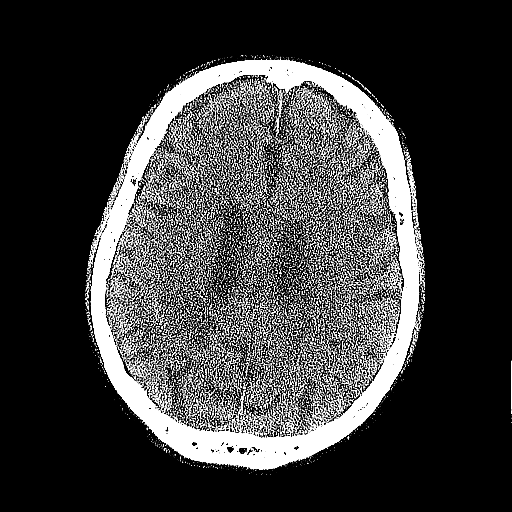
[im 49/75  brain]
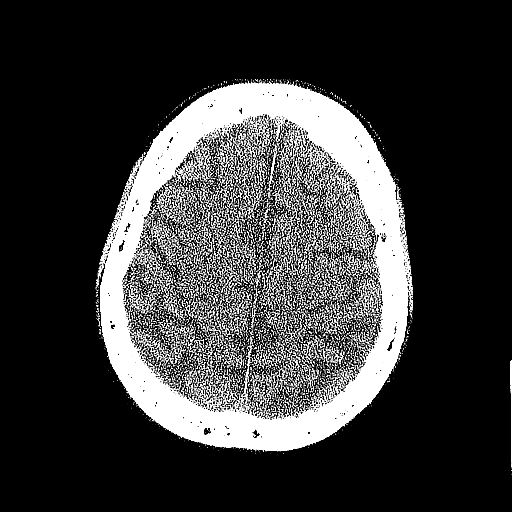
[im 56/75  brain]
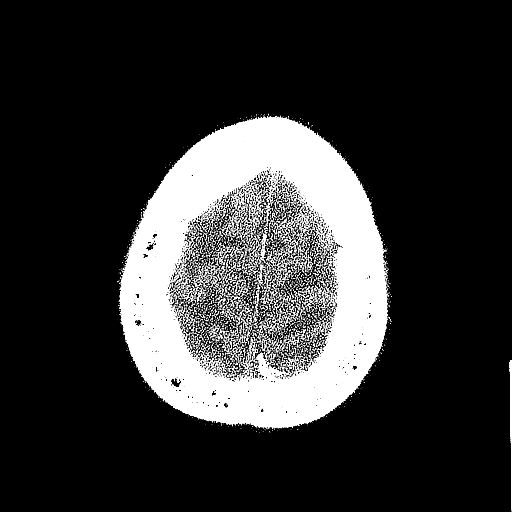
[im 63/75  brain]
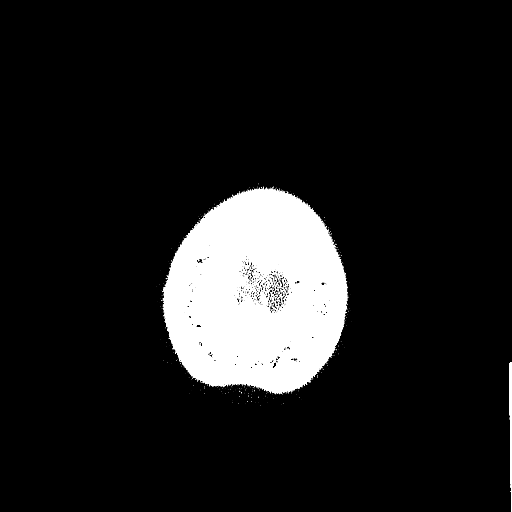
[im 63/75  bone]
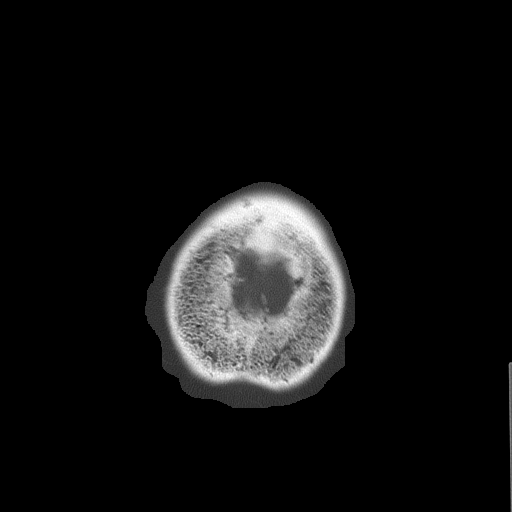
[im 71/75  brain]
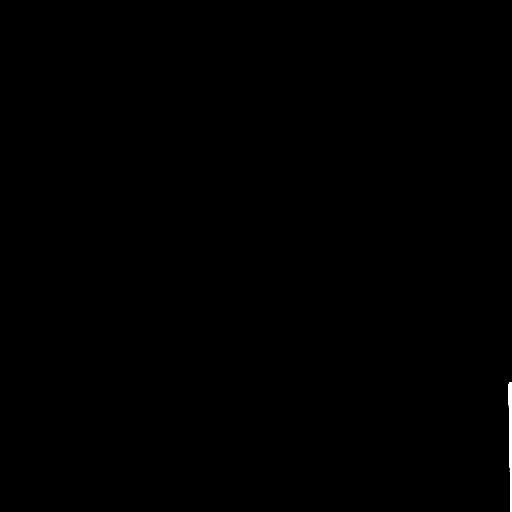

[16 of 47 positions shown; findings below may reference images not displayed]

FINDINGS: Brain: Old bilateral cerebellar infarcts. Chronic white matter
ischemic change. Mild cerebral atrophy which is likely age related.
No evidence of acute infarction, hemorrhage, hydrocephalus,
extra-axial collection or mass lesion/mass effect.

Vascular: No hyperdense vessel or unexpected calcification.

Skull: Normal. Negative for fracture or focal lesion.

Sinuses/Orbits: No acute finding.

Other: None.
IMPRESSION: No acute intracranial abnormality

## 2023-05-31 IMAGING — DX DG CHEST 1V PORT
1 series · 1 of 1 positions shown · non-contrast
Comparison: Chest x-ray 12/28/2019.

CLINICAL DATA: [AGE] female with history of cough.

EXAM:
PORTABLE CHEST 1 VIEW

[chest ap]
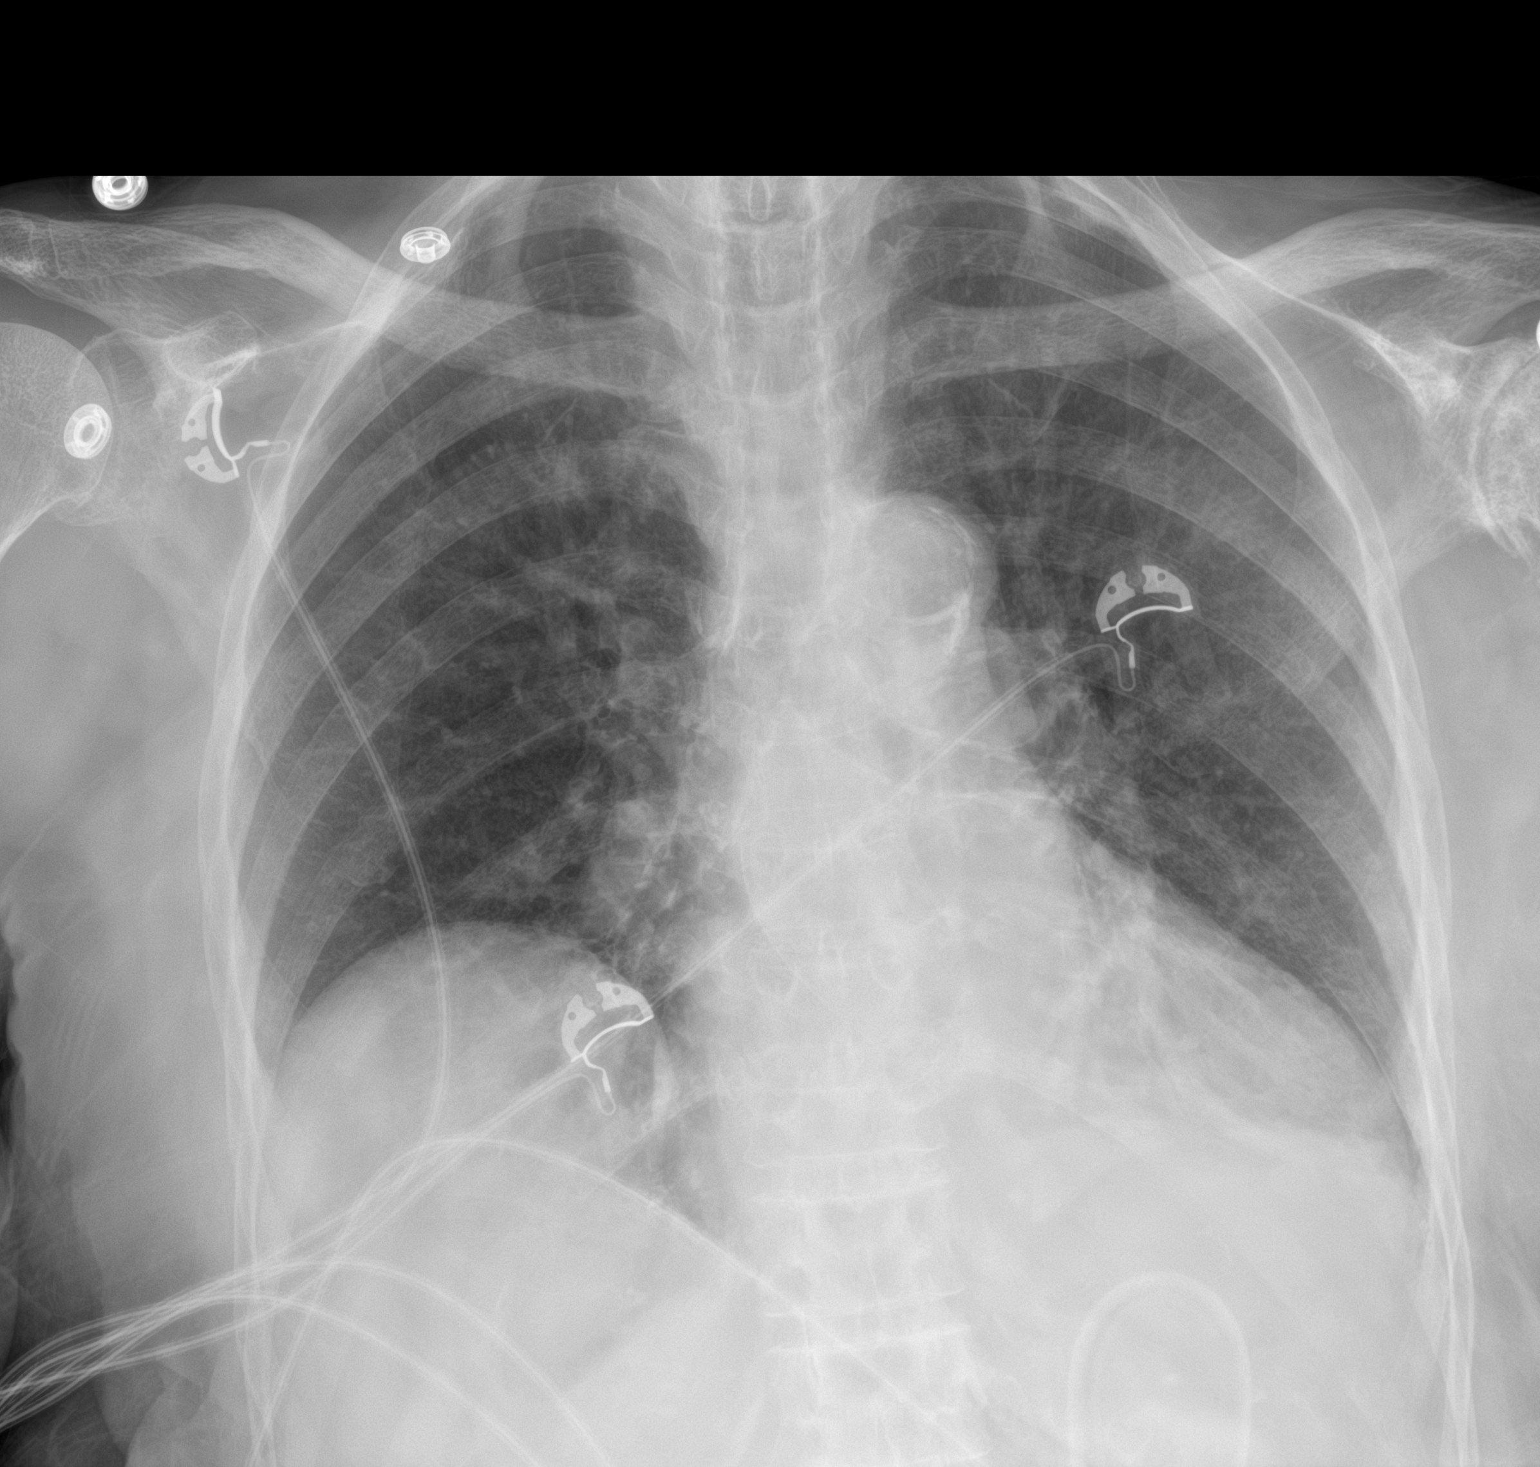

[1 of 1 positions shown; findings below may reference images not displayed]

FINDINGS: Lung volumes are low. Opacities at the left base, favored to reflect
subsegmental atelectasis or chronic scarring. No definite
consolidative airspace disease. No pleural effusions. Mild elevation
of the right hemidiaphragm is chronic. No pneumothorax. No evidence
of pulmonary edema. Heart size is normal. Upper mediastinal contours
are within normal limits. Atherosclerotic calcifications in the
thoracic aorta.
IMPRESSION: 1. Low lung volumes with subsegmental atelectasis or scarring at the
left lung base.
2. Aortic atherosclerosis.

## 2023-05-31 IMAGING — CT CT ABD-PELV W/ CM
2 of 6 series · 16 of 46 positions shown, 18 images · IV contrast (APPLIED)
Comparison: 04/22/2020

CLINICAL DATA: Abdominal pain, generalized weakness

EXAM:
CT ABDOMEN AND PELVIS WITH CONTRAST
TECHNIQUE: Multidetector CT imaging of the abdomen and pelvis was performed
using the standard protocol following bolus administration of
intravenous contrast.
CONTRAST:  50mL OMNIPAQUE IOHEXOL 350 MG/ML SOLN

[Series 3: abdomen 5.0 · axial · 0.67mm/px · z∈[-679,-324]mm · 13 of 83 slices shown, 15 images]
[im 6/83  soft-tissue]
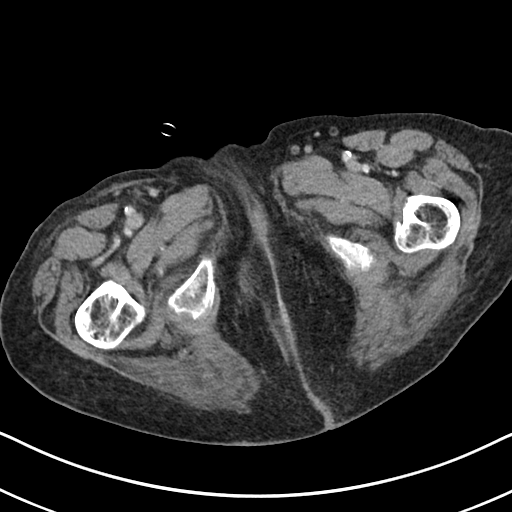
[im 6/83  bone]
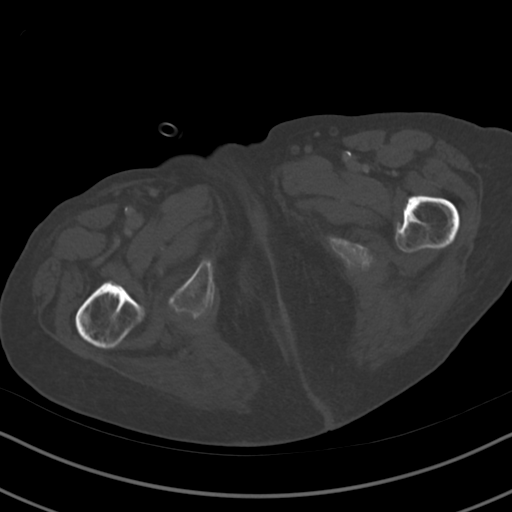
[im 11/83  soft-tissue]
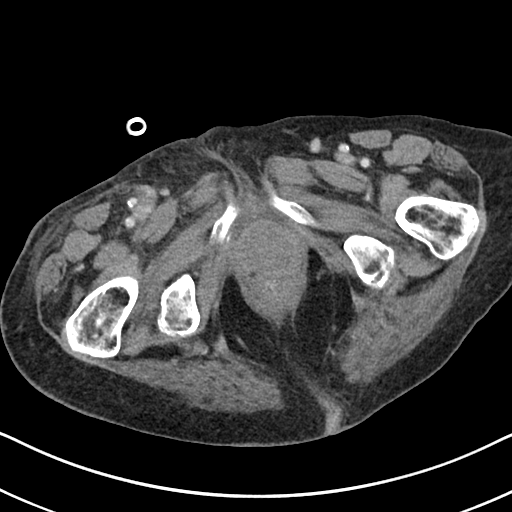
[im 17/83  soft-tissue]
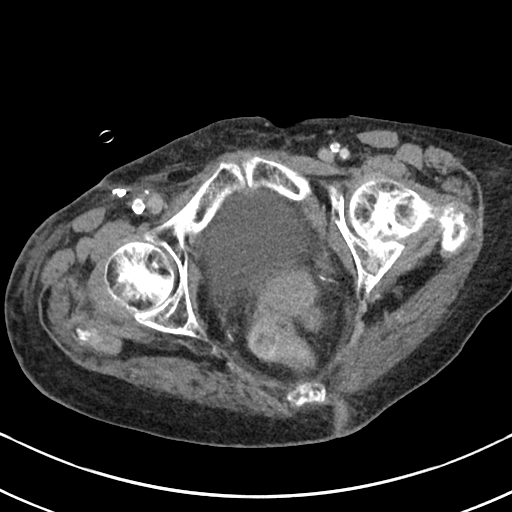
[im 22/83  soft-tissue]
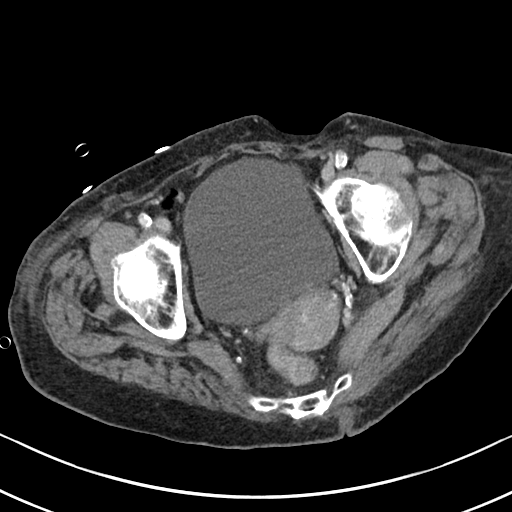
[im 28/83  soft-tissue]
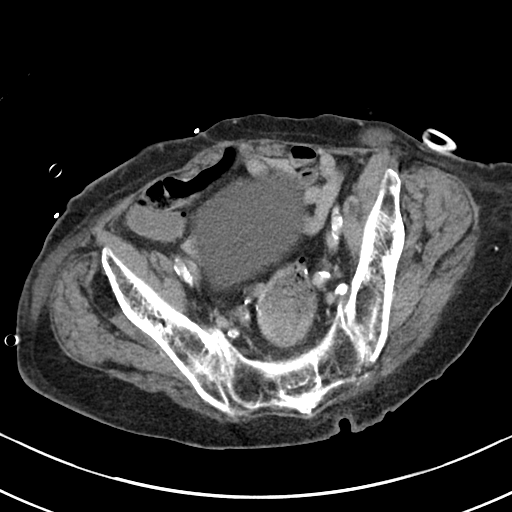
[im 33/83  soft-tissue]
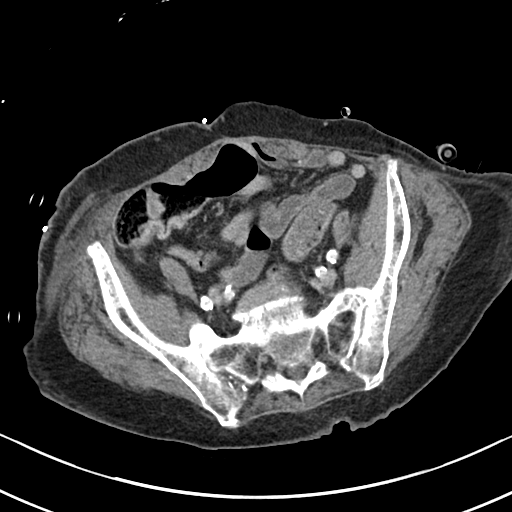
[im 44/83  soft-tissue]
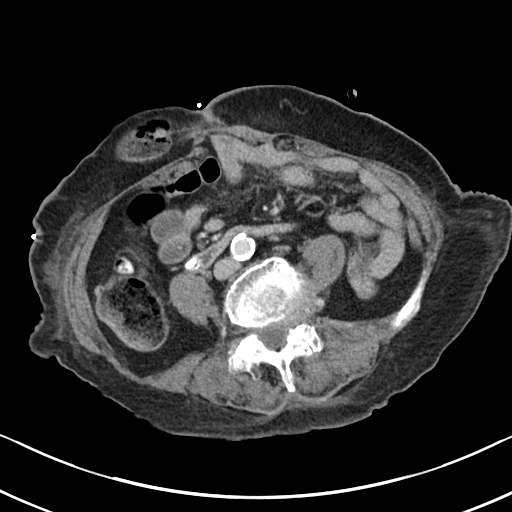
[im 50/83  soft-tissue]
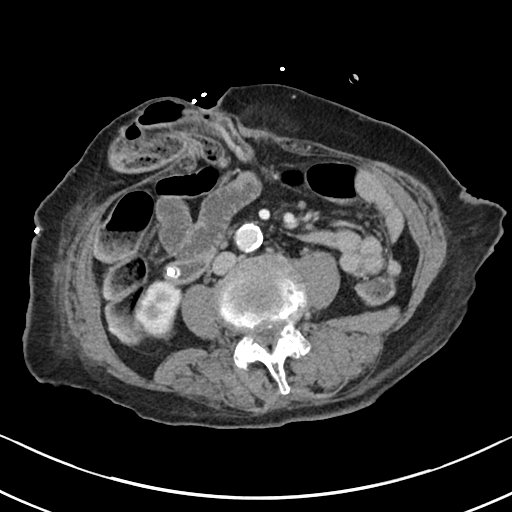
[im 55/83  soft-tissue]
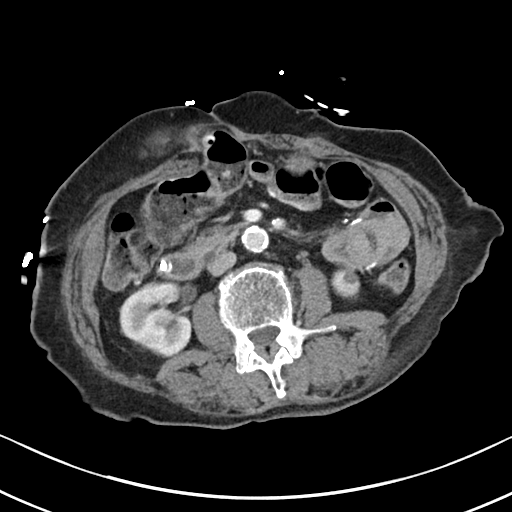
[im 55/83  bone]
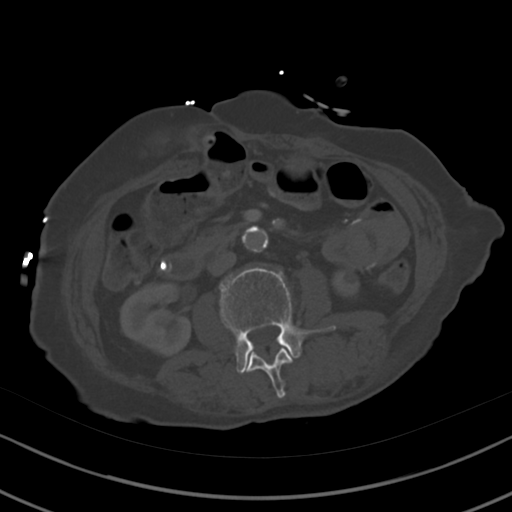
[im 61/83  soft-tissue]
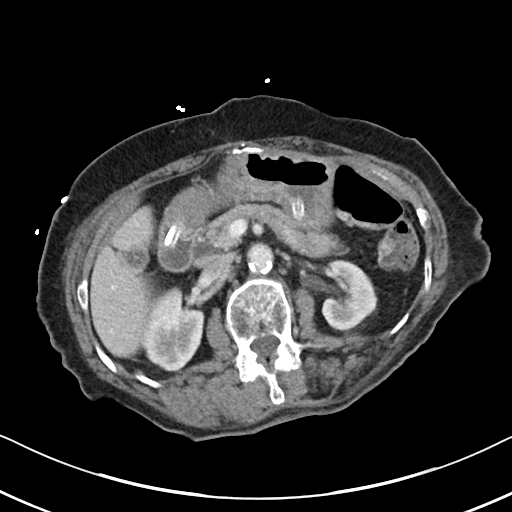
[im 66/83  soft-tissue]
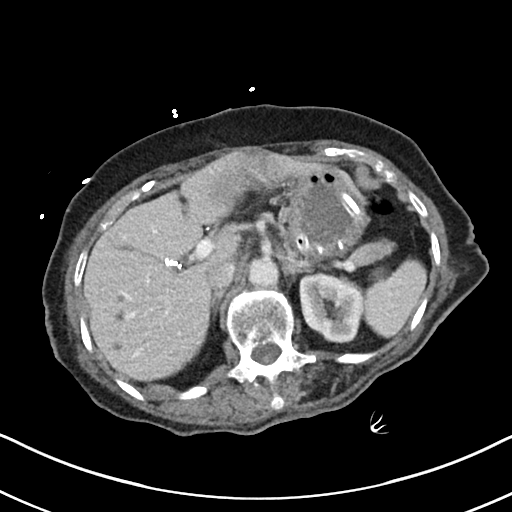
[im 72/83  soft-tissue]
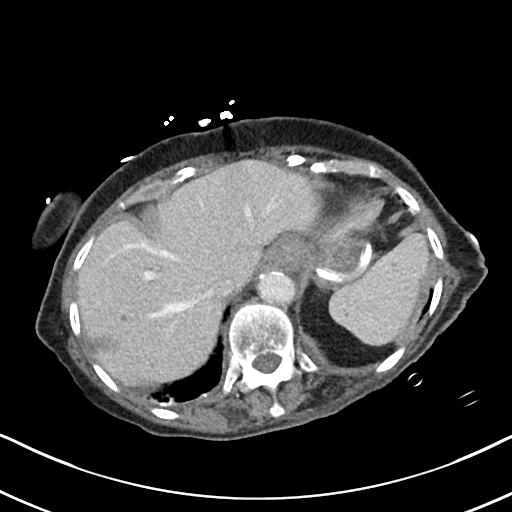
[im 77/83  soft-tissue]
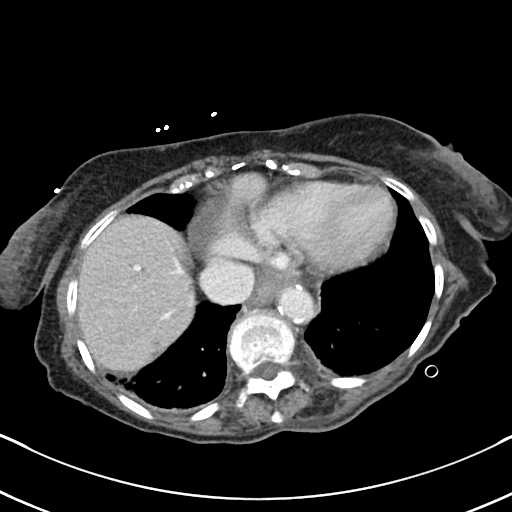

[Series 6: abdomen 3.0 mpr cor · coronal · 0.78mm/px · 3 of 84 slices shown]
[im 28/84  soft-tissue]
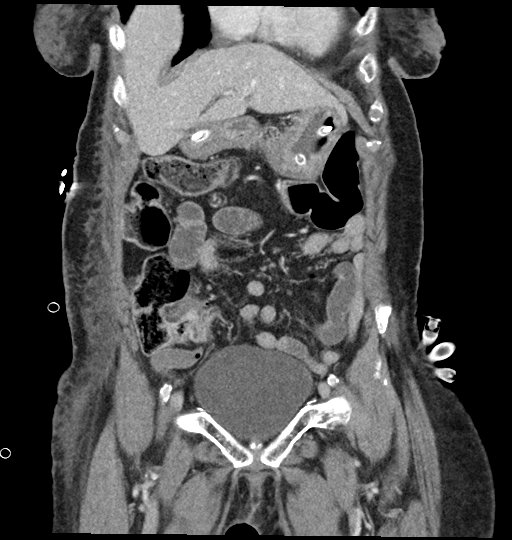
[im 37/84  soft-tissue]
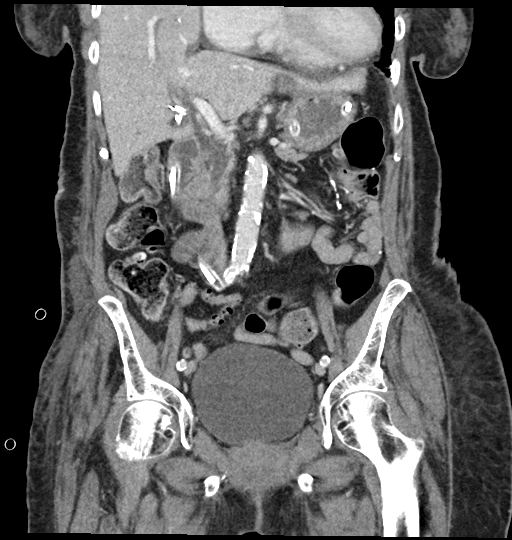
[im 47/84  soft-tissue]
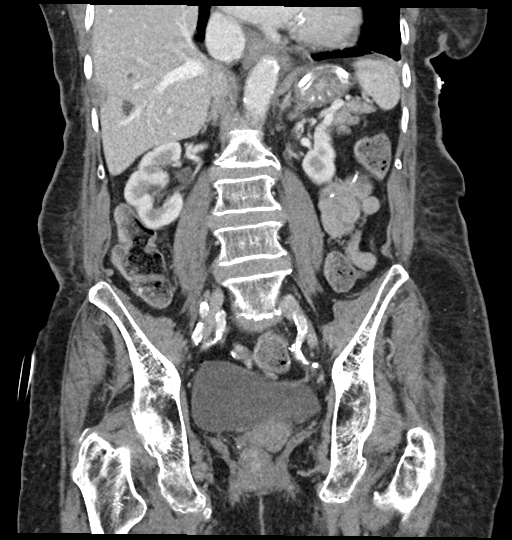

[16 of 46 positions shown; findings below may reference images not displayed]

FINDINGS: Lower chest: No acute pleural or parenchymal lung disease.

Hepatobiliary: Numerous hepatic hypodensities are compatible with
cysts, stable. The gallbladder is surgically absent.

Pancreas: Unremarkable. No pancreatic ductal dilatation or
surrounding inflammatory changes.

Spleen: Normal in size without focal abnormality.

Adrenals/Urinary Tract: Adrenal glands are unremarkable. Kidneys are
normal, without renal calculi, focal lesion, or hydronephrosis.
Bladder is unremarkable.

Stomach/Bowel: No bowel obstruction or ileus. Postsurgical changes
are seen from prior sigmoid colon resection and reanastomosis. No
bowel wall thickening or inflammatory change. A percutaneous
gastrojejunostomy tube is identified. Catheter is coiled within the
gastric lumen, tip at the level of the third portion duodenum.

Vascular/Lymphatic: Aortic atherosclerosis. No enlarged abdominal or
pelvic lymph nodes.

Reproductive: Uterus and bilateral adnexa are unremarkable.

Other: No free fluid or free gas. There are bilateral paraumbilical
ventral hernias. The right para umbilical hernia contains a portion
of the transverse colon. No evidence of incarceration or
obstruction.

Musculoskeletal: No acute or destructive bony lesions. Reconstructed
images demonstrate no additional findings.
IMPRESSION: 1. Paraumbilical ventral hernias as above. No evidence of bowel
incarceration or obstruction.
2. Percutaneous gastrojejunostomy tube coiled within the gastric
lumen, with tip extending only to the third portion duodenum.
3.  Aortic Atherosclerosis ([id number]).

## 2023-06-01 IMAGING — MR MR HEAD WO/W CM
10 of 12 series · 34 of 48 positions shown · IV contrast (gadavist)
Comparison: CT head 12/28/2020.  MRI head 07/07/2019

CLINICAL DATA: Headache.  Altered mental status.

EXAM:
MRI HEAD WITHOUT AND WITH CONTRAST
TECHNIQUE: Multiplanar, multiecho pulse sequences of the brain and surrounding
structures were obtained without and with intravenous contrast.
CONTRAST:  5mL GADAVIST GADOBUTROL 1 MMOL/ML IV SOLN

[Series 3: DWI · axial · 3.0mm · 1.09mm/px · z∈[-99,+47]mm · 8 of 100 slices shown (1 of 4)]
[im 1/100]
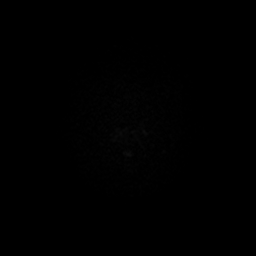
[im 12/100]
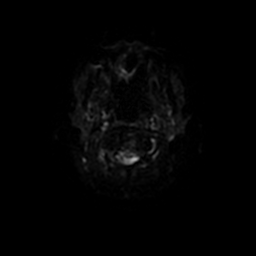
[im 34/100]
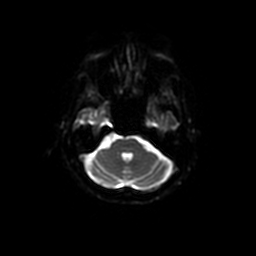
[im 45/100]
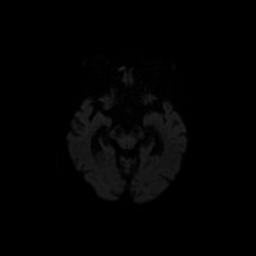
[im 56/100]
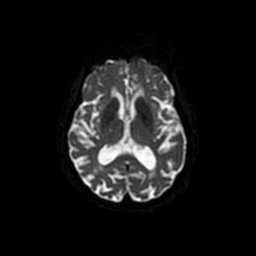
[im 67/100]
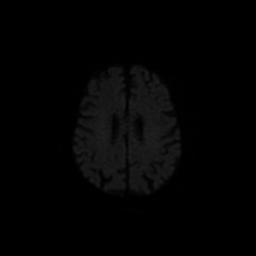
[im 89/100]
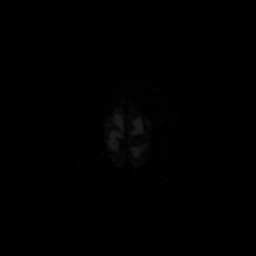
[im 100/100]
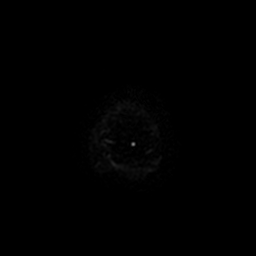

[Series 4: DWI · coronal · 5.0mm · 1.09mm/px · 6 of 70 slices shown (2 of 4)]
[im 1/70]
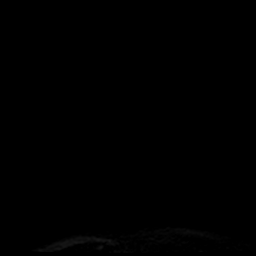
[im 14/70]
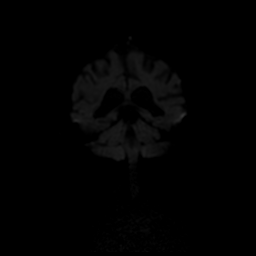
[im 28/70]
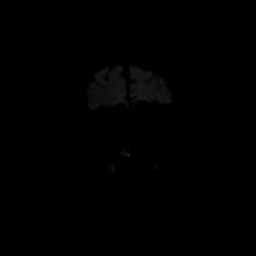
[im 42/70]
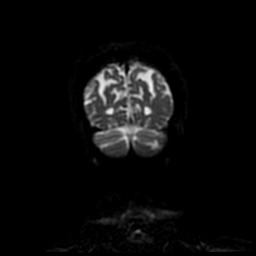
[im 56/70]
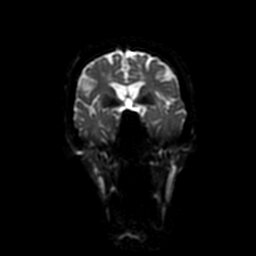
[im 70/70]
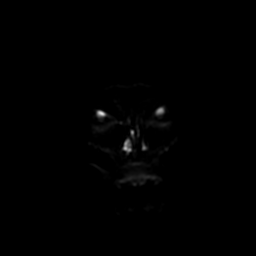

[Series 5: T1 · sagittal · 5.0mm · 0.47mm/px · 1 of 23 slices shown]
[im 1/23]
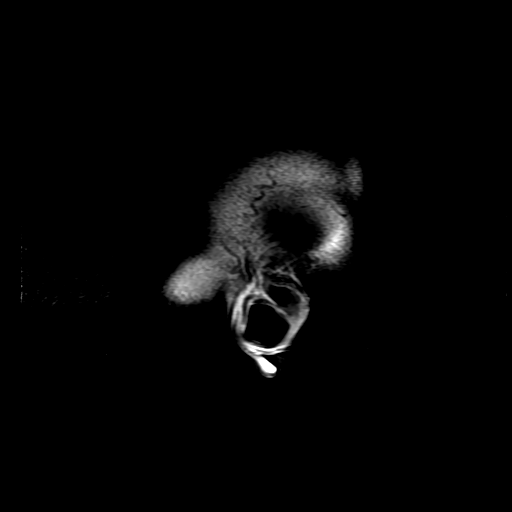

[Series 6: T2 · axial · 5.0mm · 0.43mm/px · z∈[-101,+36]mm · 2 of 24 slices shown]
[im 1/24]
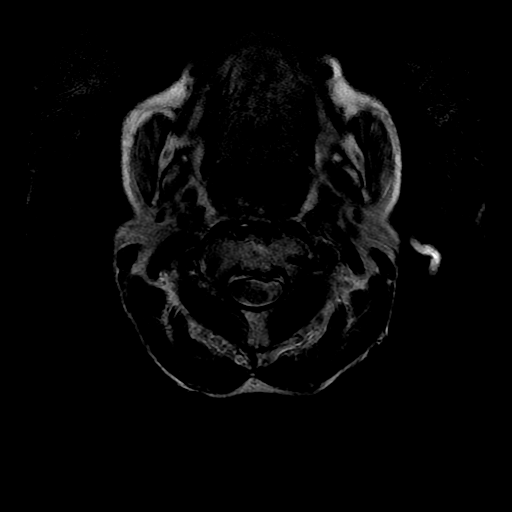
[im 24/24]
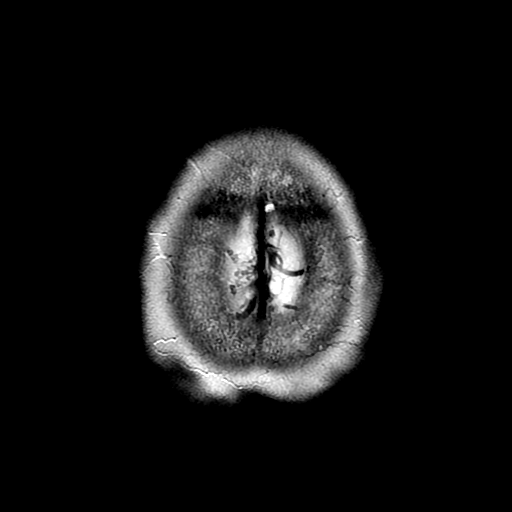

[Series 7: FLAIR · axial · 3.0mm · 0.43mm/px · z∈[-101,+36]mm · 2 of 24 slices shown]
[im 1/24]
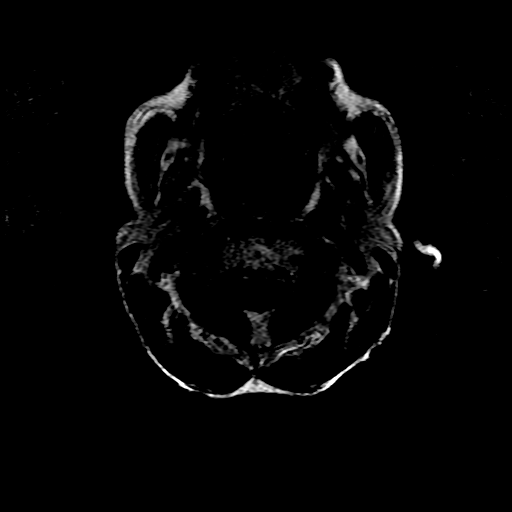
[im 24/24]
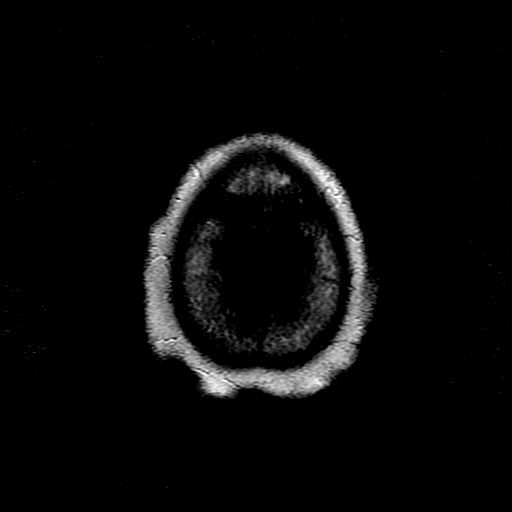

[Series 10: T2 post-contrast · coronal · 5.0mm · 0.39mm/px · 2 of 24 slices shown]
[im 1/24]
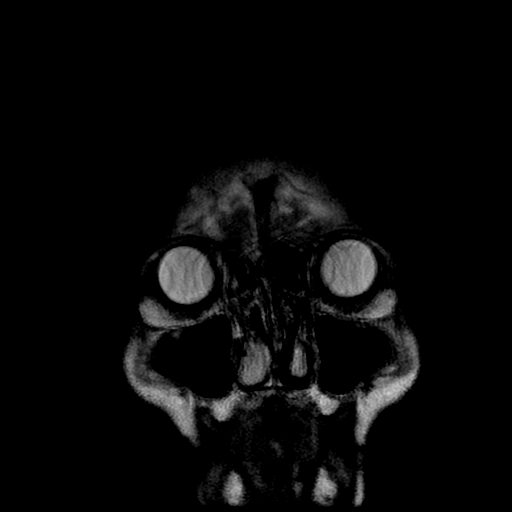
[im 24/24]
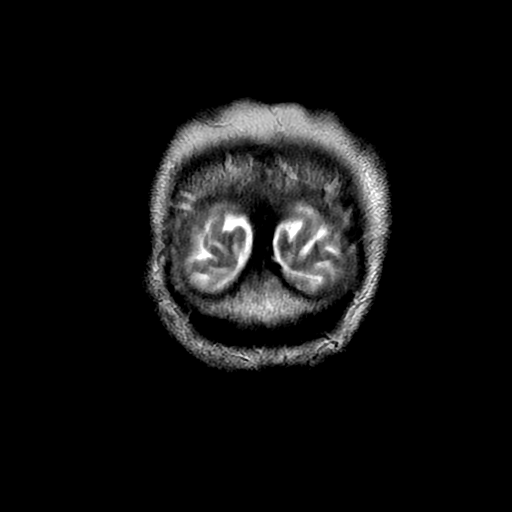

[Series 11: T1 post-contrast · axial · 3.0mm · 0.43mm/px · z∈[-106,+40]mm · 4 of 50 slices shown (1 of 2)]
[im 1/50]
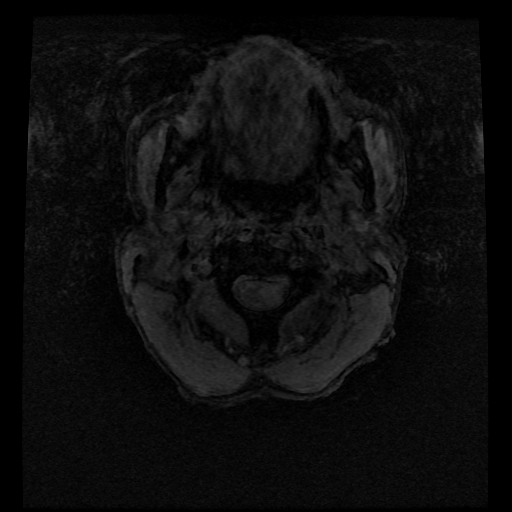
[im 17/50]
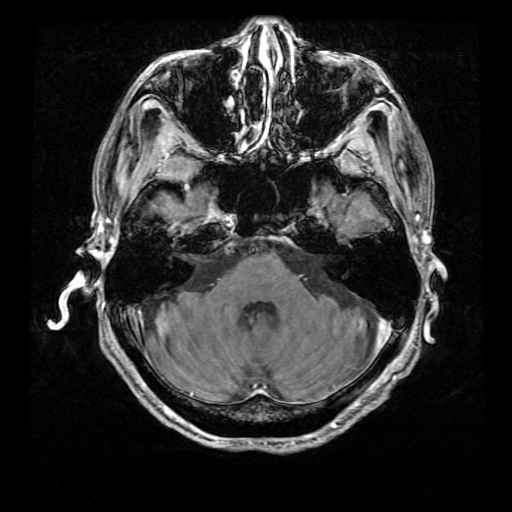
[im 33/50]
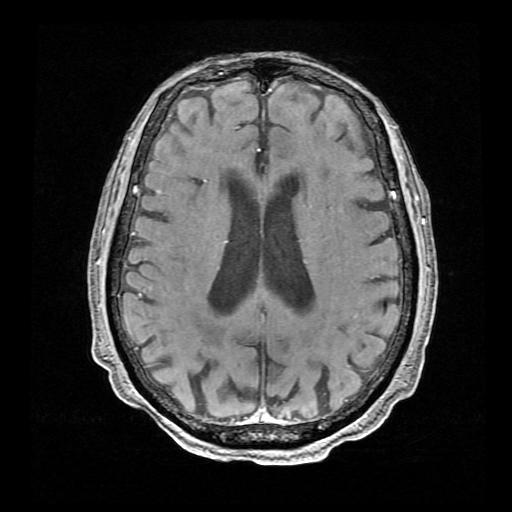
[im 50/50]
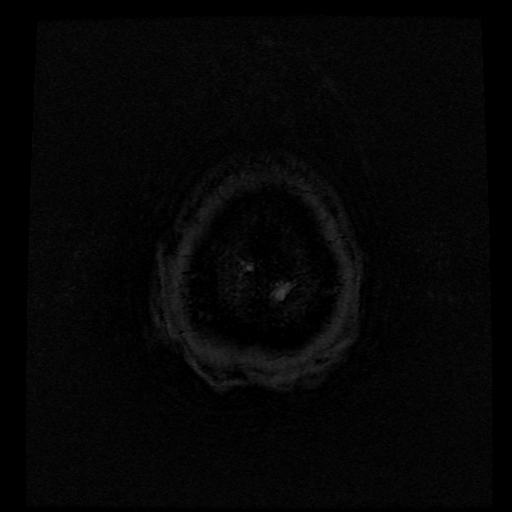

[Series 12: T1 post-contrast · coronal · 5.0mm · 0.39mm/px · 2 of 25 slices shown (2 of 2)]
[im 1/25]
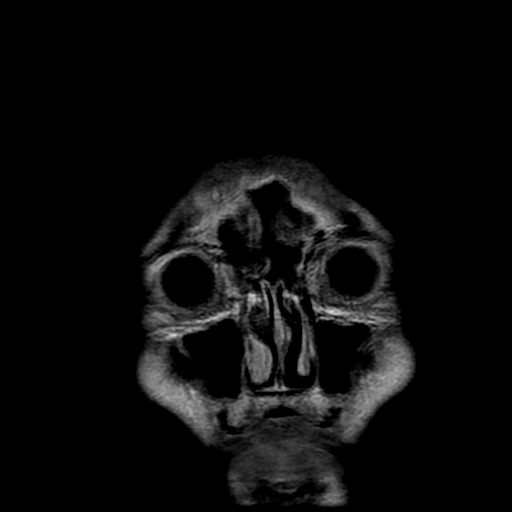
[im 25/25]
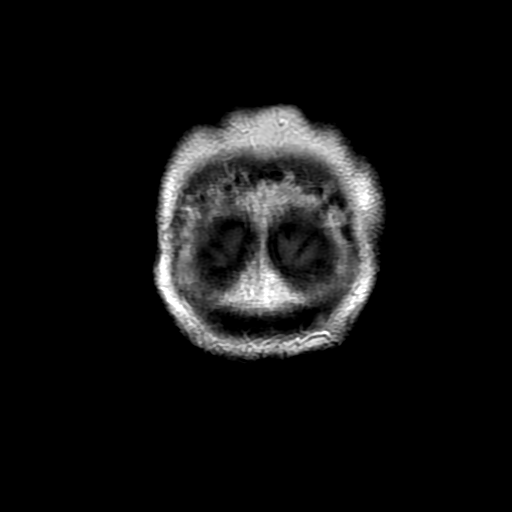

[Series 300: DWI · axial · 3.0mm · 1.09mm/px · z∈[-99,+47]mm · 4 of 50 slices shown (3 of 4)]
[im 1/50]
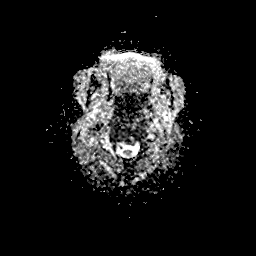
[im 17/50]
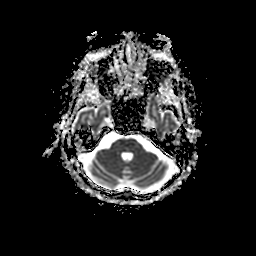
[im 33/50]
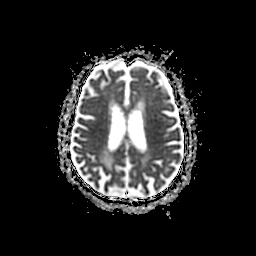
[im 50/50]
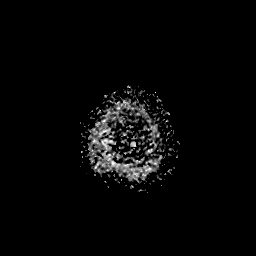

[Series 400: DWI · coronal · 5.0mm · 1.09mm/px · 3 of 35 slices shown (4 of 4)]
[im 1/35]
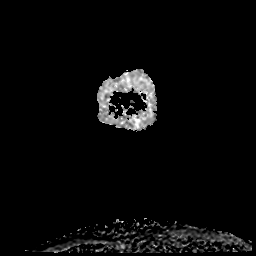
[im 18/35]
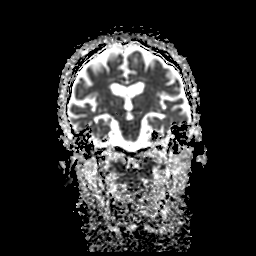
[im 35/35]
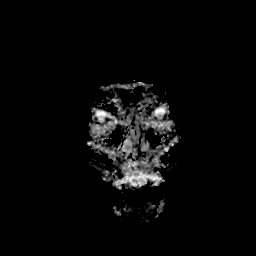

[34 of 48 positions shown; findings below may reference images not displayed]

FINDINGS: Brain: Negative for acute infarct.

Generalized atrophy without hydrocephalus. Chronic microvascular
ischemic changes in the white matter bilaterally. Chronic infarcts
in the cerebellum bilaterally. Negative for hemorrhage

Dural-based enhancing mass right frontal convexity measuring 10 x 17
mm. This appears to have enlarged since the prior MRI 1410 when the
lesion was difficult to identify however the prior study was not
done with contrast and is degraded by motion.

Vascular: Normal arterial flow voids at the skull base

Skull and upper cervical spine: Cervical spondylosis. Prominent
degenerative change pannus at C1-2 causing spinal stenosis.

Sinuses/Orbits: Paranasal sinuses clear. Bilateral cataract
extraction

Other: None
IMPRESSION: Negative for acute infarct

Atrophy and chronic ischemic change

Right frontal dural-based mass compatible with meningioma 17 x 10
mm. This appears to have enlarged since the MRI of 07/07/2019

## 2023-07-18 IMAGING — DX DG FEMUR 2+V PORT*R*
1 series · 4 of 4 positions shown · non-contrast
Comparison: None.

CLINICAL DATA: Pain.

EXAM:
RIGHT FEMUR PORTABLE 2 VIEW

[Series 1: femur · 0.14mm/px · 4 of 4 slices shown]
[im 1/4]
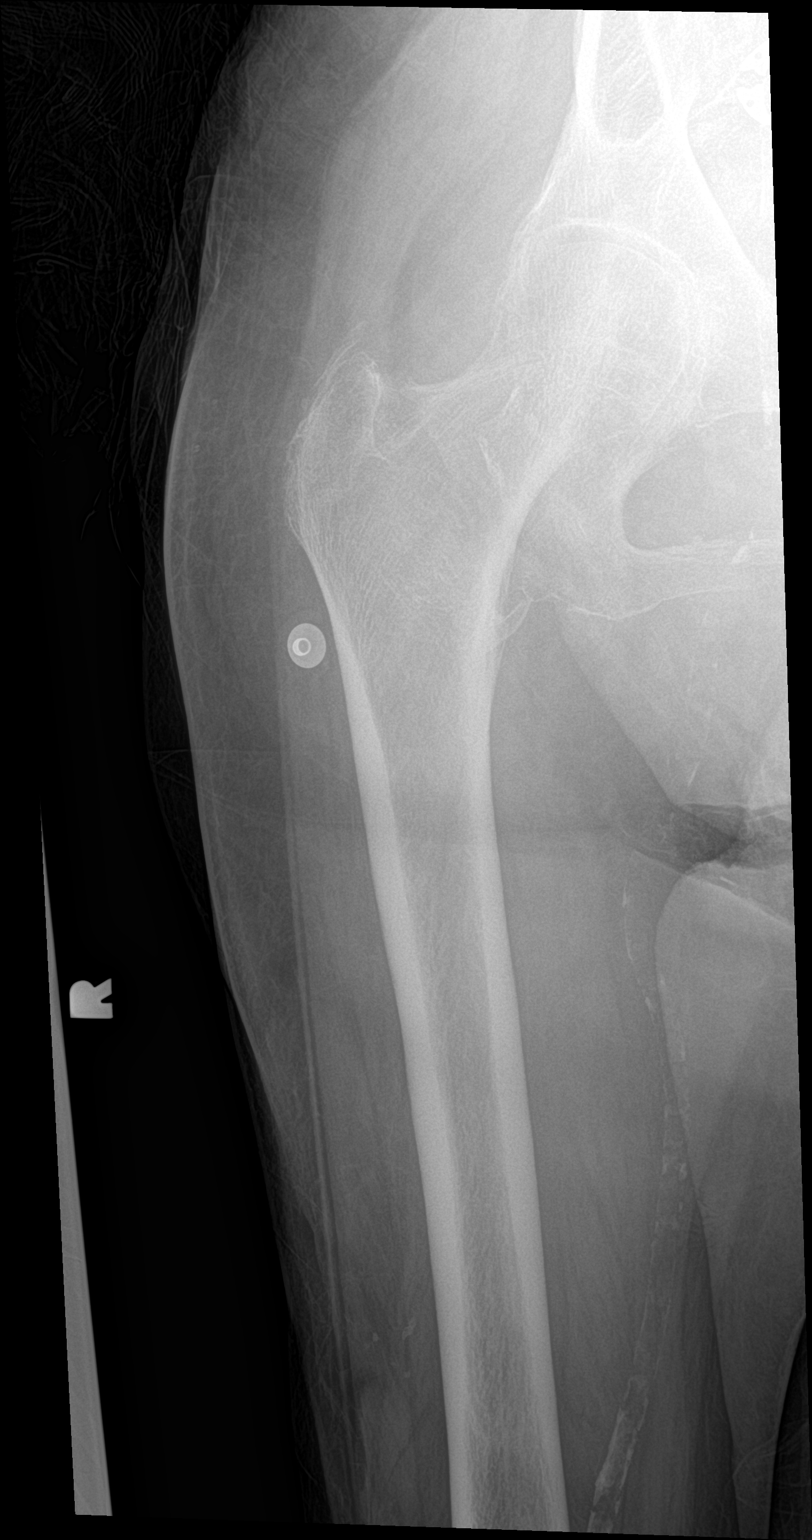
[im 2/4]
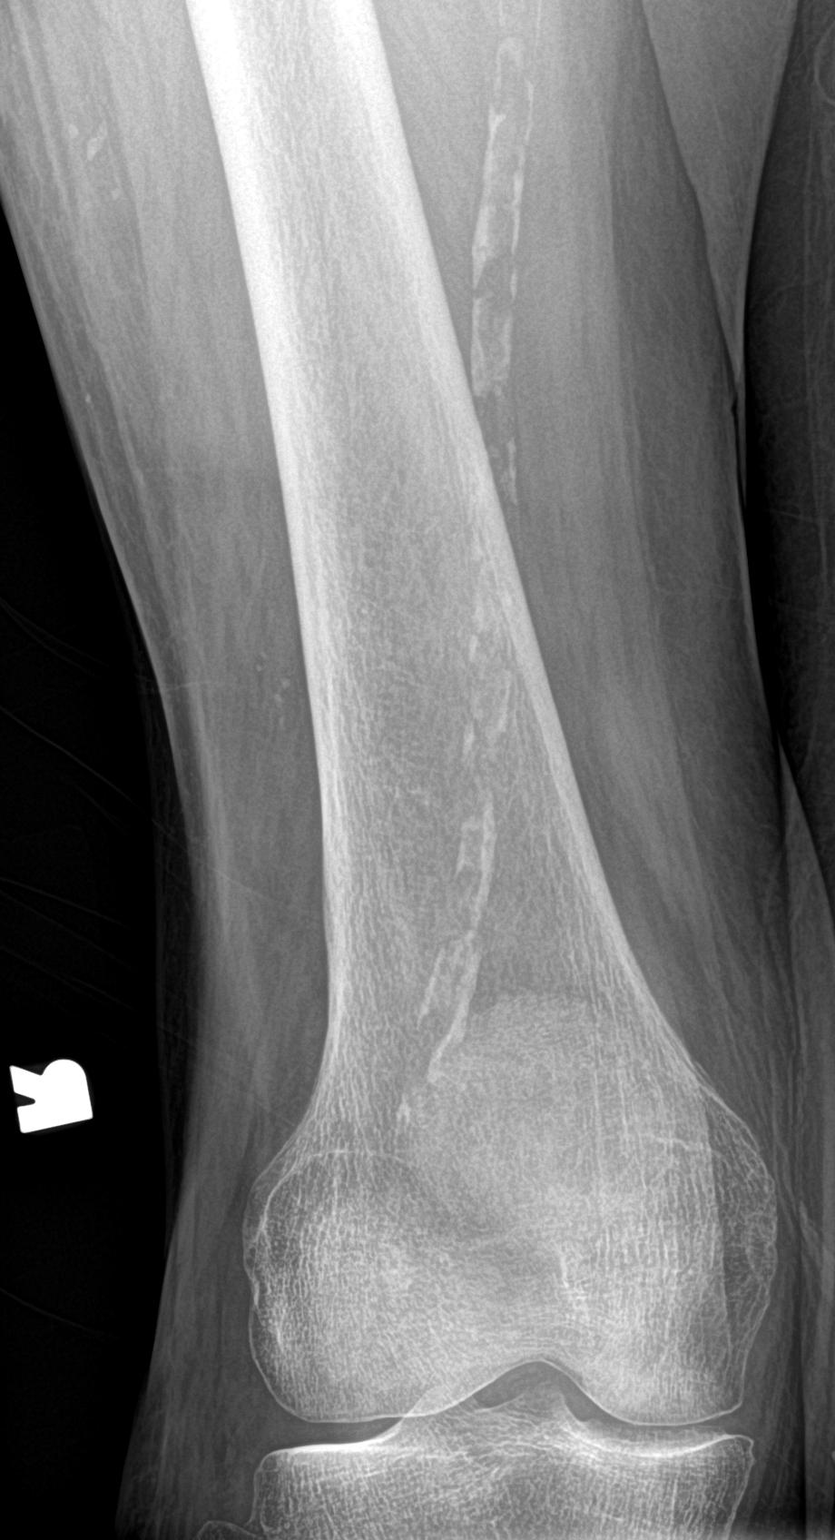
[im 3/4]
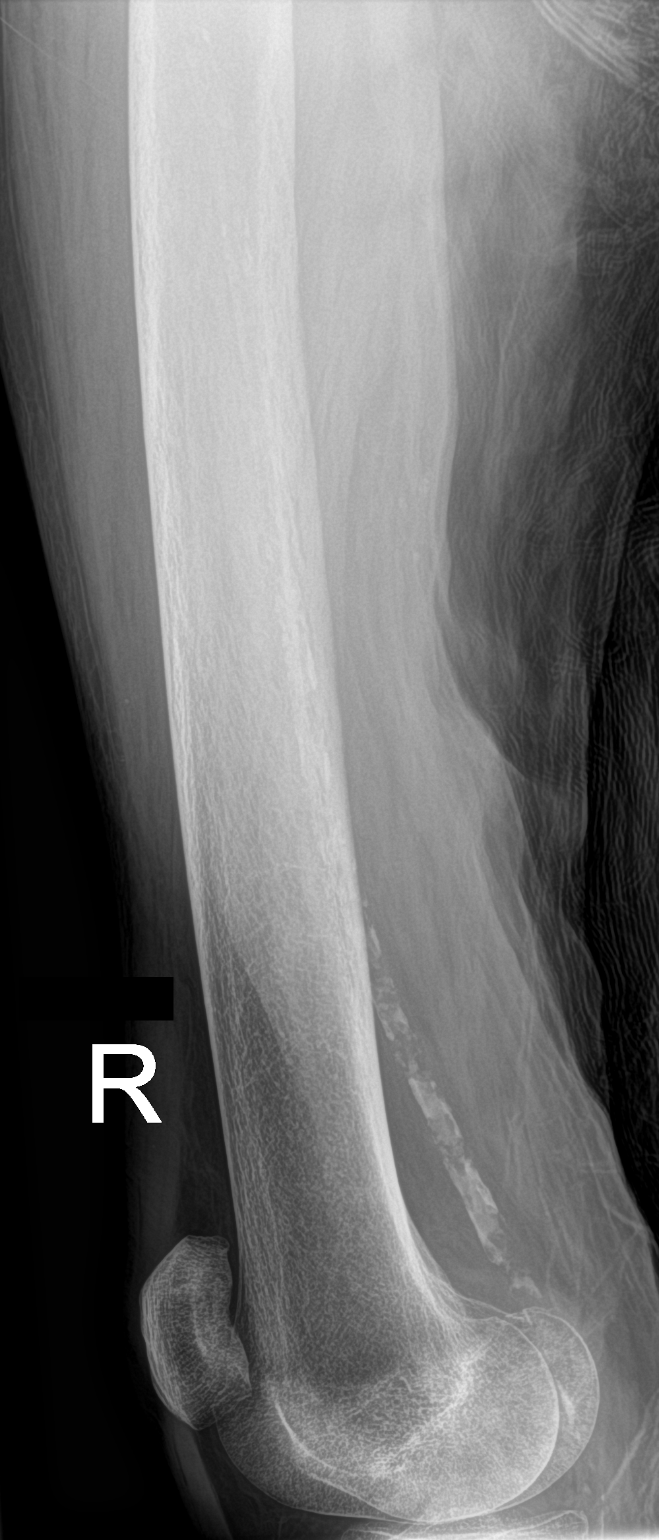
[im 4/4]
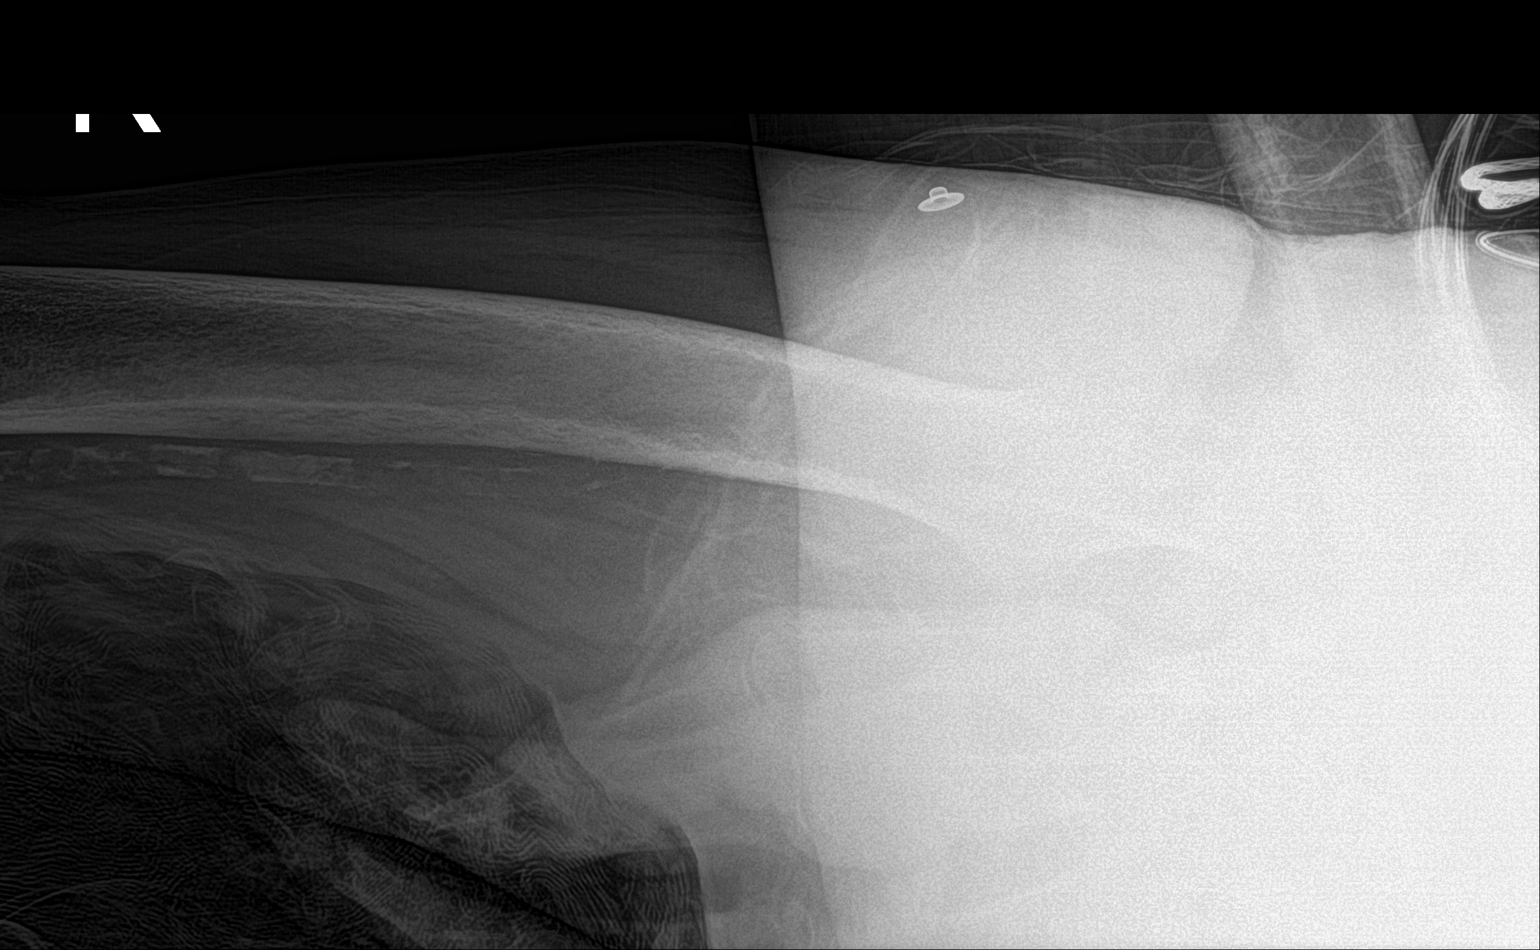

[4 of 4 positions shown; findings below may reference images not displayed]

FINDINGS: The bones are under mineralized. Cortical margins of the femur are
intact. There is no evidence of fracture or other focal bone
lesions. Hip and knee alignment are maintained. There is no
periosteal reaction or bony destruction. Vascular calcifications.
Soft tissues are otherwise unremarkable.
IMPRESSION: No acute osseous abnormality or explanation for pain.

## 2023-07-18 IMAGING — CT CT ABD-PELV W/O CM
2 of 4 series · 14 of 46 positions shown, 16 images · non-contrast
Comparison: CT 12/28/2020

CLINICAL DATA: [AGE] with left lower quadrant abdominal pain.
G-tube pain. Weakness and lethargy.

EXAM:
CT ABDOMEN AND PELVIS WITHOUT CONTRAST
TECHNIQUE: Multidetector CT imaging of the abdomen and pelvis was performed
following the standard protocol without IV contrast.

[Series 4: abd/ pelvis 5.0 i30f 2 · axial · 0.82mm/px · z∈[+742,+1107]mm · 11 of 89 slices shown, 13 images]
[im 8/89  soft-tissue]
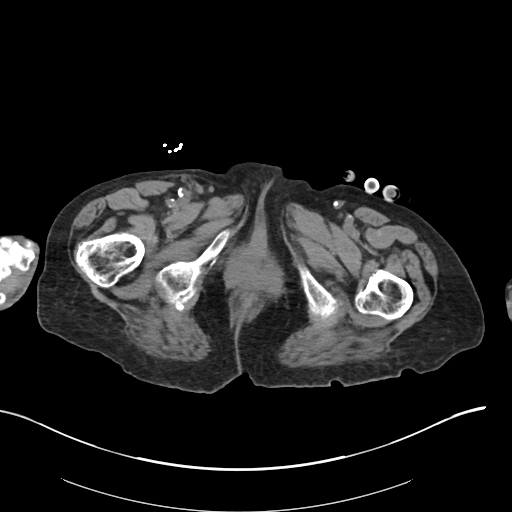
[im 8/89  bone]
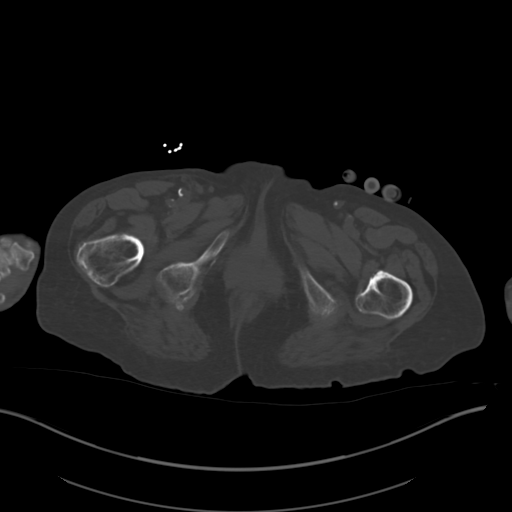
[im 15/89  soft-tissue]
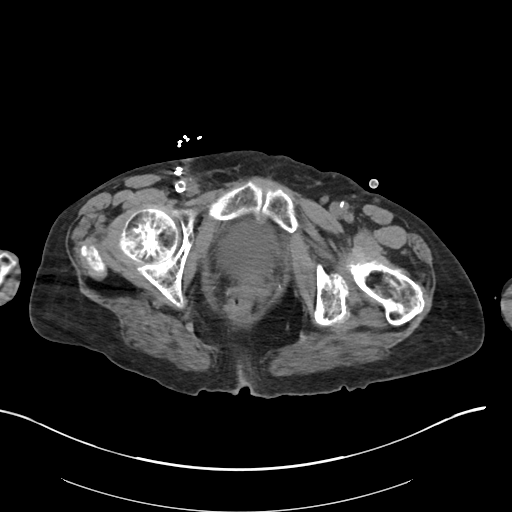
[im 23/89  soft-tissue]
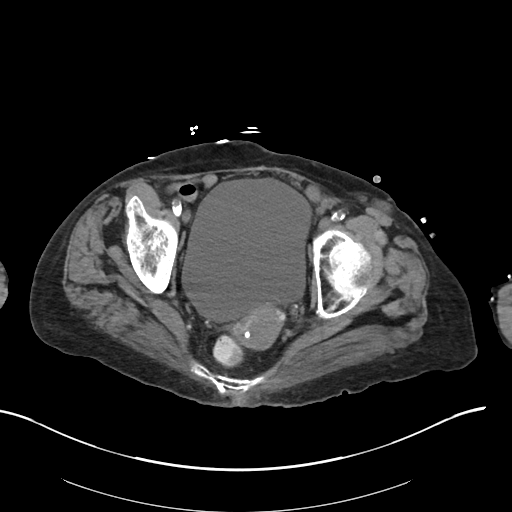
[im 30/89  soft-tissue]
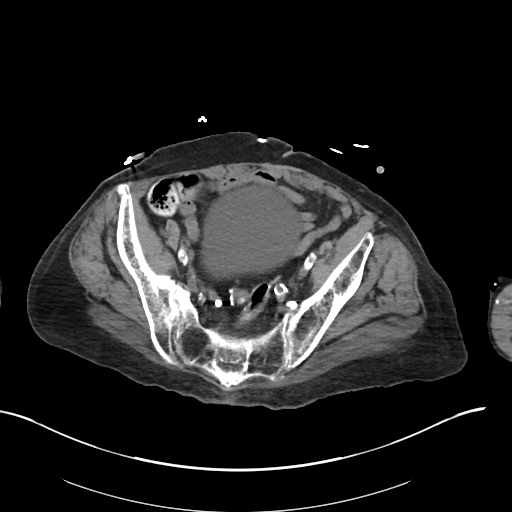
[im 37/89  soft-tissue]
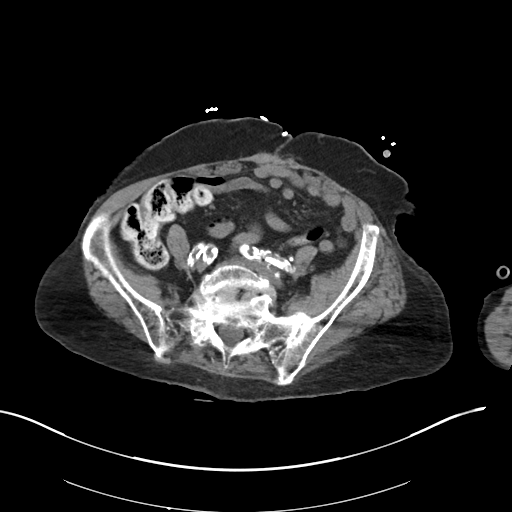
[im 45/89  soft-tissue]
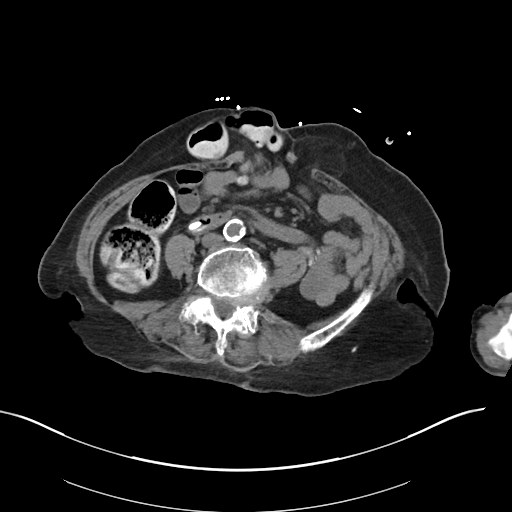
[im 52/89  soft-tissue]
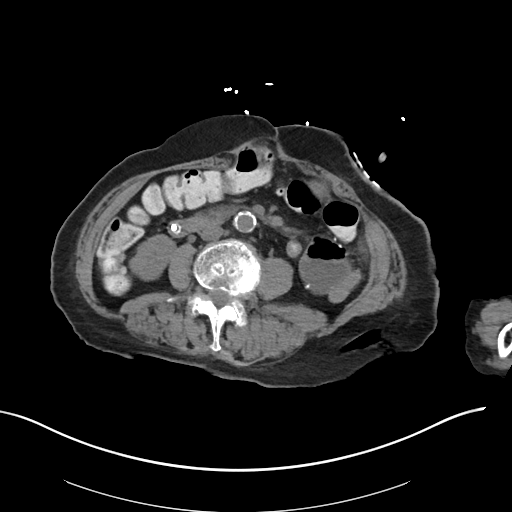
[im 59/89  soft-tissue]
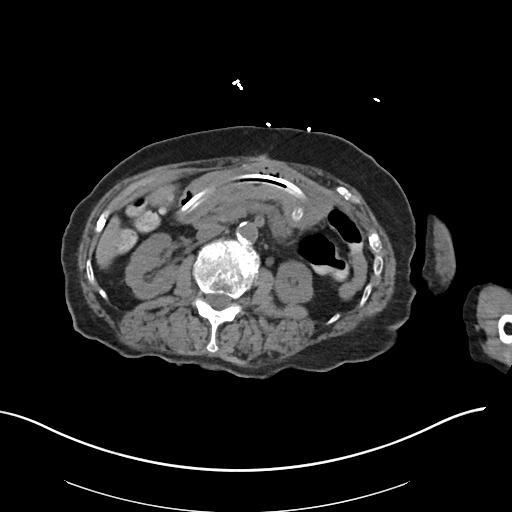
[im 67/89  soft-tissue]
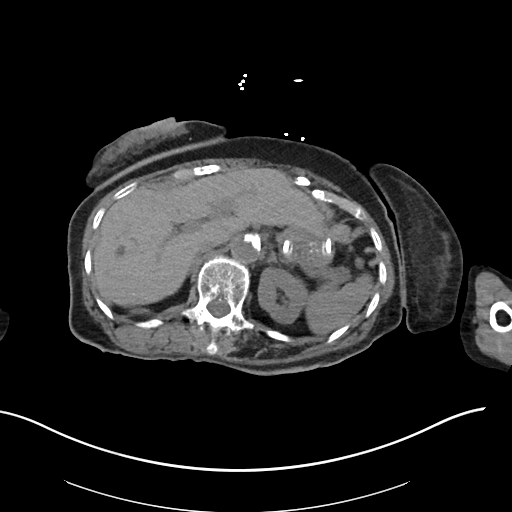
[im 67/89  bone]
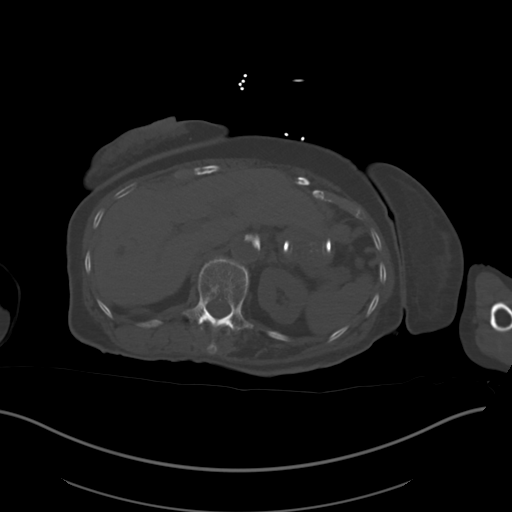
[im 74/89  soft-tissue]
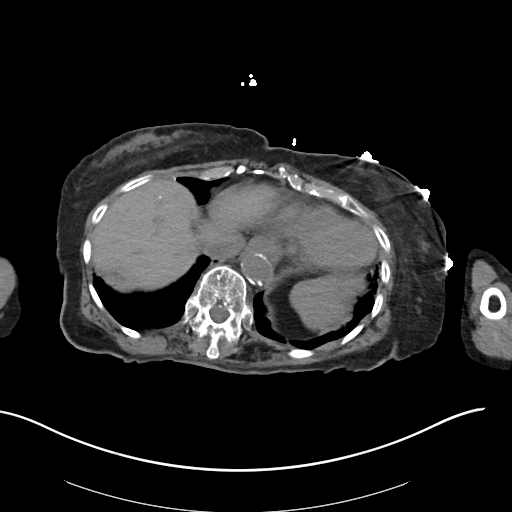
[im 81/89  soft-tissue]
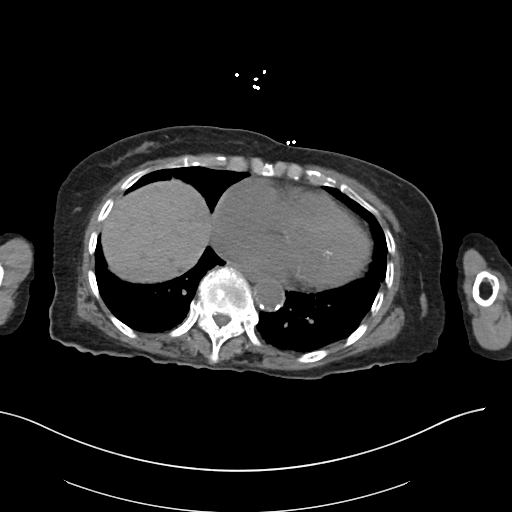

[Series 7: cor st · coronal · 0.68mm/px · 3 of 87 slices shown]
[im 29/87  soft-tissue]
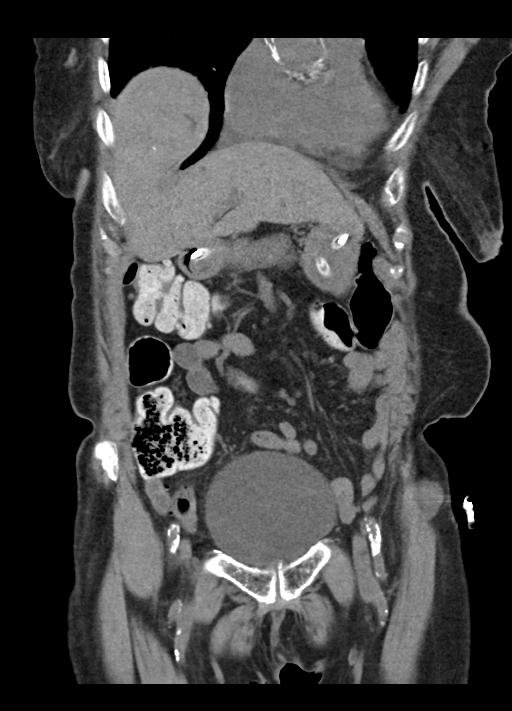
[im 39/87  soft-tissue]
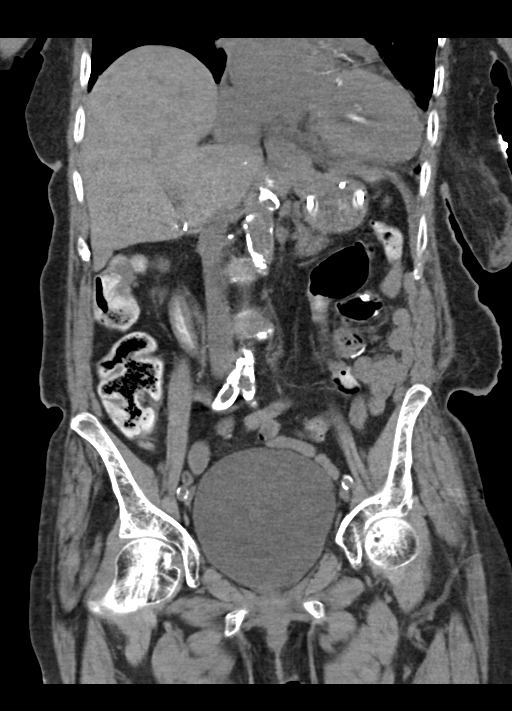
[im 48/87  soft-tissue]
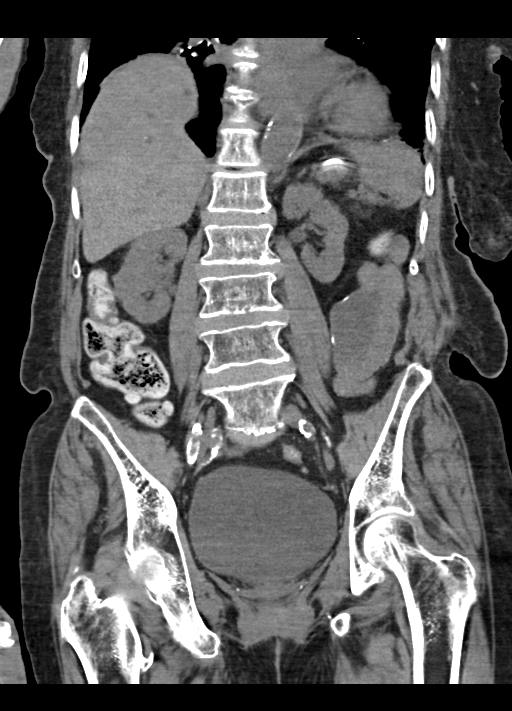

[14 of 46 positions shown; findings below may reference images not displayed]

FINDINGS: Lower chest: Cardiomegaly. No acute basilar airspace disease or
pleural effusion.

Hepatobiliary: Scattered small low-density liver lesions are similar
to prior exam, though less well-defined currently in the absence of
IV contrast. Subcapsular calcifications in the right lobe is
unchanged. Cholecystectomy without biliary dilatation.

Pancreas: Nonacute.  Atrophic.  No ductal dilatation.

Spleen: Normal in size without focal abnormality.

Adrenals/Urinary Tract: No adrenal nodule. Mild bilateral renal
parenchymal thinning. No hydronephrosis or urolithiasis. Tiny
peripheral hyperdense lesion from the mid right kidney is too small
to characterize but likely hyperdense cyst. Urinary bladder is
distended but no wall thickening.

Stomach/Bowel: Gastrojejunostomy tube in the stomach with balloon
appropriately positioned and opposed to the anterior abdominal wall.
Tubing is looped in the stomach, tip of the tube is in the [DATE]
portion of the duodenum. There is no abnormality along the
subcutaneous portion of the gastrostomy tube. Overall stomach is
decompressed. Chain sutures about the anterior stomach. Median
ventral abdominal wall hernia contains short segment of transverse
colon. There is no associated wall thickening or obstruction.
Multifocal colonic diverticulosis without diverticulitis. There is
also mild distal ileal diverticulosis. No small bowel obstruction.
Enteric anastomosis in the left abdomen again seen, with slightly
patulous appearance of the anastomotic segment, but noninflamed.

Vascular/Lymphatic: Aortic atherosclerosis without aneurysm.
Advanced bi-iliac atherosclerosis. Surgical clips within the right
groin likely at site of prior vascular access. There is no portal
venous or mesenteric gas. No bulky abdominopelvic adenopathy.

Reproductive: Normal for age uterine atrophy.  No adnexal mass.

Other: No free air or ascites. Ventral abdominal wall hernia, right
paramedian hernia contains a nonobstructed noninflamed transverse
colon. Left paramidline hernia contains only fat. Anterior to the
left hip in the subcutaneous tissues is a 2.7 x 1.5 cm ovoid soft
tissue density. This was present on multiple prior exams, and is
stable over the course of 10 months, but overall increased in size
from 6868 exam. Assessment margins and no surrounding inflammation.

Musculoskeletal: The bones are diffusely under mineralized. No acute
osseous findings. Minimal soft tissue thickening overlying the
sacrum but no evidence of abscess no focal fluid collection.
IMPRESSION: 1. No acute findings.
2. Gastrojejunostomy tube in the stomach with balloon appropriately
positioned and opposed to the anterior abdominal wall. Tubing is
looped in the stomach, tip of the tube is in the [DATE] portion of the
duodenum. No abnormality along the subcutaneous portion of the
gastrostomy tube.
3. Ventral abdominal wall hernias. Right paramedial hernia contains
nonobstructed noninflamed transverse colon, and is unchanged from
prior imaging.
4. Ovoid soft tissue density anterior to the left hip in the
subcutaneous tissues measuring 2.7 x 1.5 cm, stable over the course
of 10 months, but overall increased in size from 6868 exam. Etiology
and significance is uncertain, however of doubtful clinical
significance in a patient of this age.
5. Colonic diverticulosis without diverticulitis.

Aortic Atherosclerosis ([id number]).

## 2023-07-29 IMAGING — XA IR REPLACE G/J TUBE W/ FLUORO
1 series · 2 of 2 positions shown · non-contrast
Comparison: CT [facility], 02/14/2021.

INDICATION: Catheter malfunction.  Leaking GJ tube.

EXAM:
FLUOROSCOPIC GUIDED REPLACEMENT OF GASTROJEJUNOSTOMY TUBE

[2d screen save: ir gj tube change · 2 of 2 slices shown]
[im 1/2]
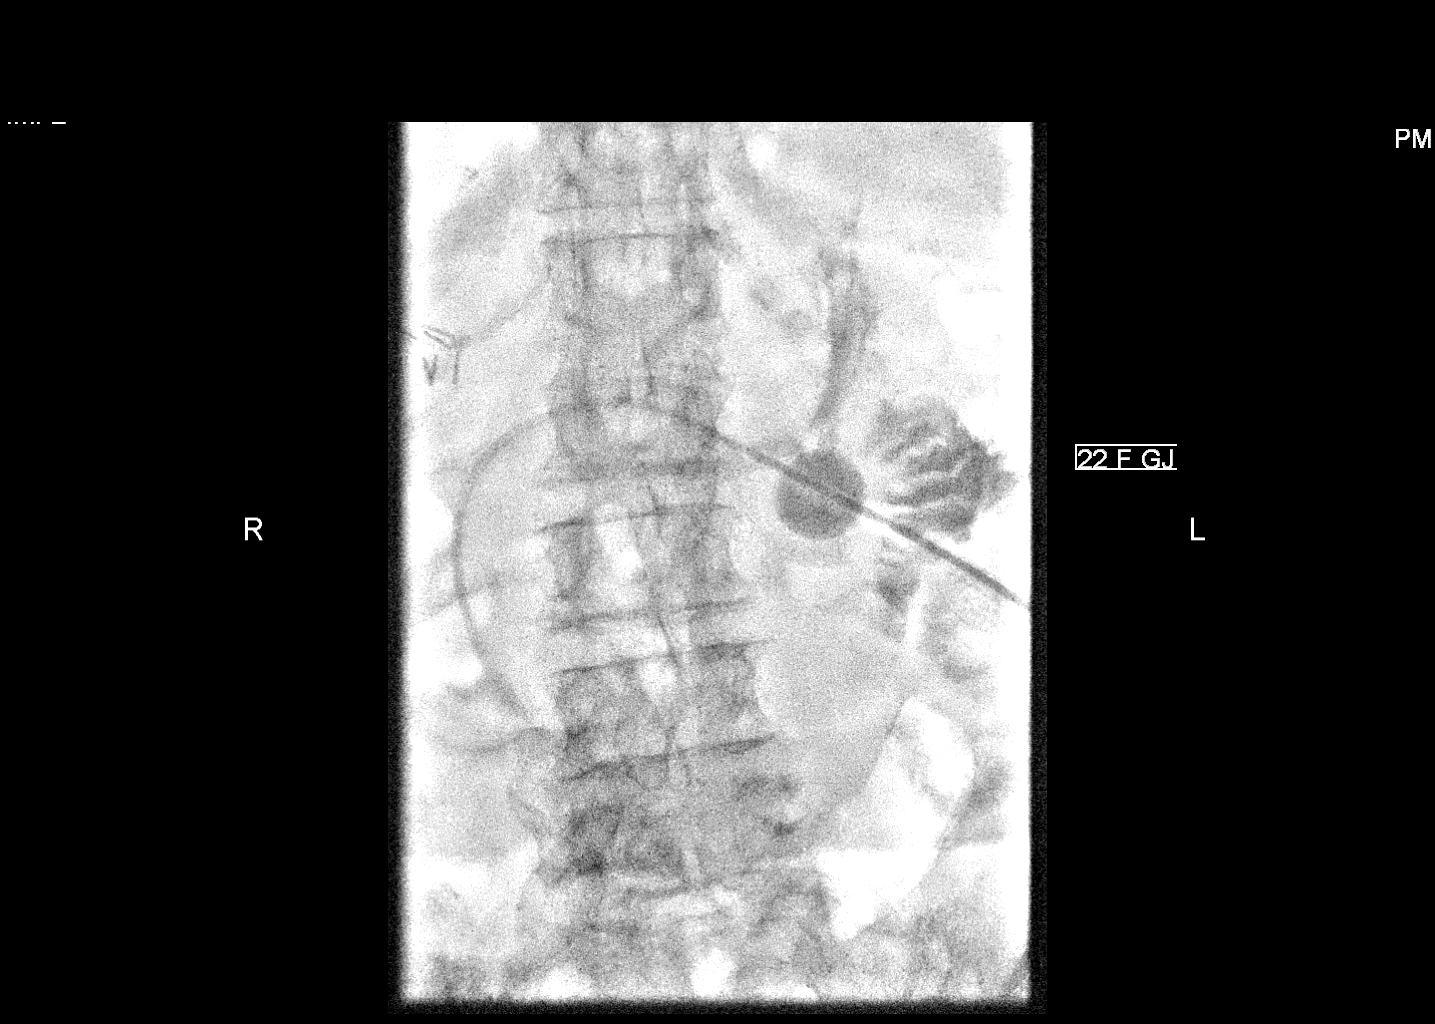
[im 2/2]
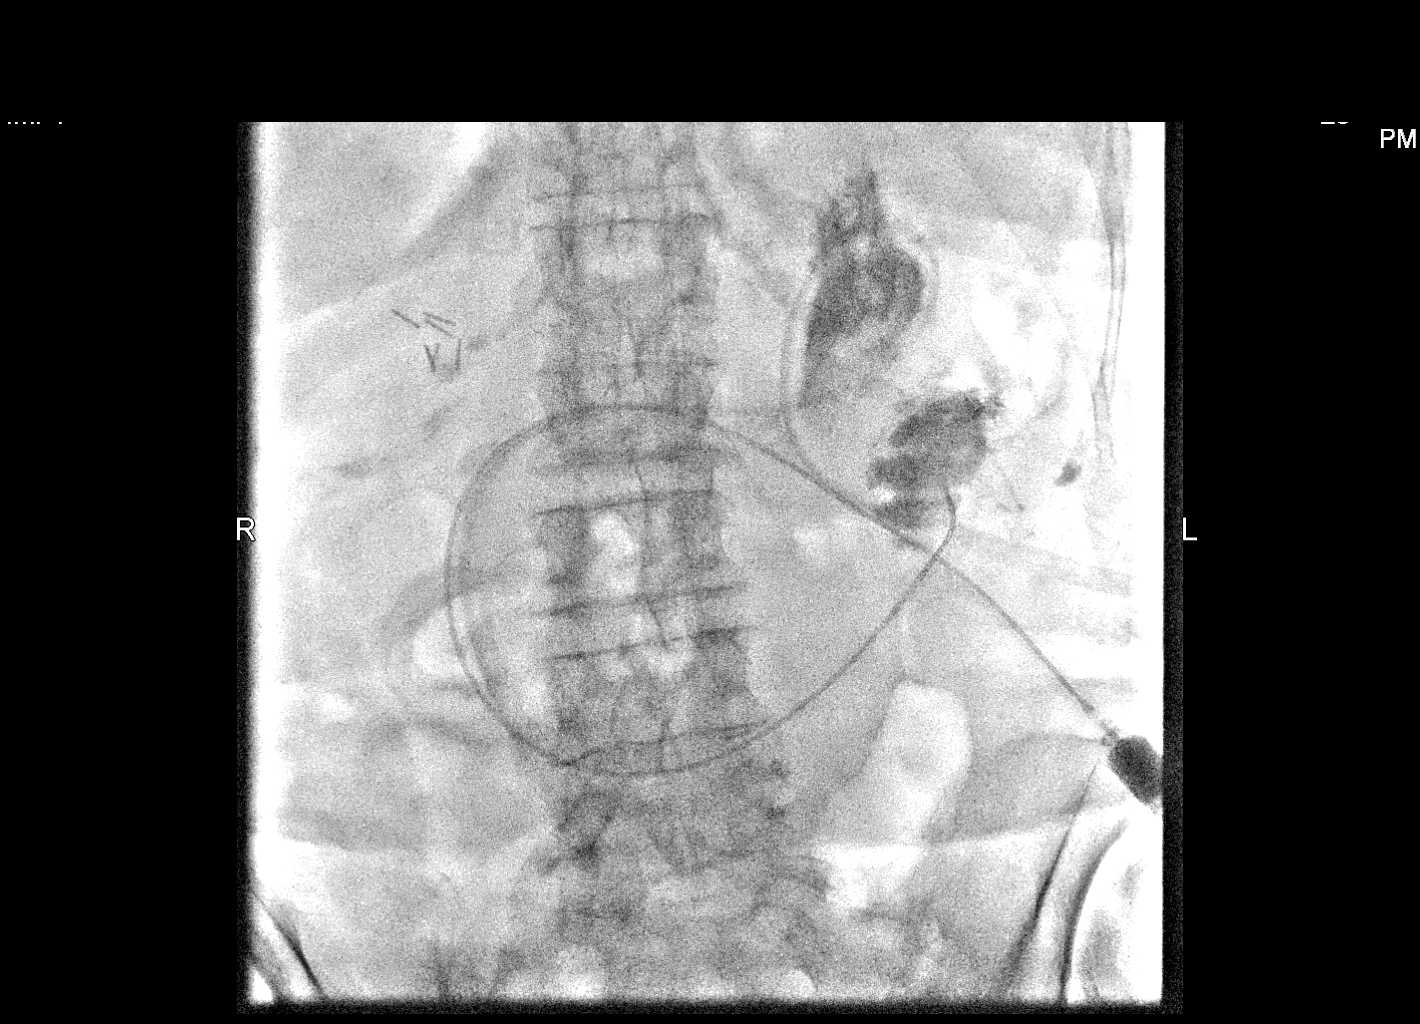

[2 of 2 positions shown; findings below may reference images not displayed]

MEDICATIONS:
None.

CONTRAST:  20 mL, Omnipaque 300

FLUOROSCOPY TIME:  4 minutes 48 seconds common 5 mGy

COMPLICATIONS:
None.

PROCEDURE:
Informed written consent was obtained from the patient after a
discussion of the risks and benefits. The upper abdomen and the
external portion of the existing gastrojejunostomy tube was prepped
and draped in the usual sterile fashion, and a sterile drape was
applied covering the operative field. Maximum barrier sterile
technique with sterile gowns and gloves were used for the procedure.
A timeout was performed prior to the initiation of the procedure.

The jejunal lumen of the existing 22-French gastrojejunostomy
catheter was cannulated with a stiff Glidewire, however was unable
to be advanced throughout the entirety of the jejunostomy lumen. A
stiff guidewire and a Kumpe catheter were used to gain access into
the proximal aspect of the small bowel. The existing occluded
gastrojejunostomy catheter was removed. Contrast injection confirmed
appropriate positioning. Under intermittent fluoroscopic guidance, a
new 22 Fr coaxial gastrojejunostomy catheter was advanced with tip
ultimately terminating within the proximal small bowel. Contrast
injection via the jejunostomy and gastric lumens confirmed
appropriate functioning and positioning.

A dressing was placed. The patient tolerated procedure well without
immediate postprocedural complication.
IMPRESSION: Successful replacement of malfunctioning 22 Fr gastrojejunostomy
catheter, as above.

The tip of the jejunostomy lumen lies within the proximal jejunum.
Both lumens ready for immediate use.

PLAN:
Secondary to supply shortage, a small bore jejunostomy portion of
the GJ tube was placed on today's visit.

The patient is to return to [facility] for exchange to a large-bore 22 Fr
GJ at the earliest mutual convenience.

## 2023-08-24 IMAGING — XA IR REPLACE G/J TUBE W/ FLUORO
1 series · 1 of 1 positions shown · non-contrast
Comparison: none

INDICATION: Malpositioned gastric jejunal feeding tube

[Series 1: ir gj tube change · 1 of 1 slices shown]
[im 1/1]
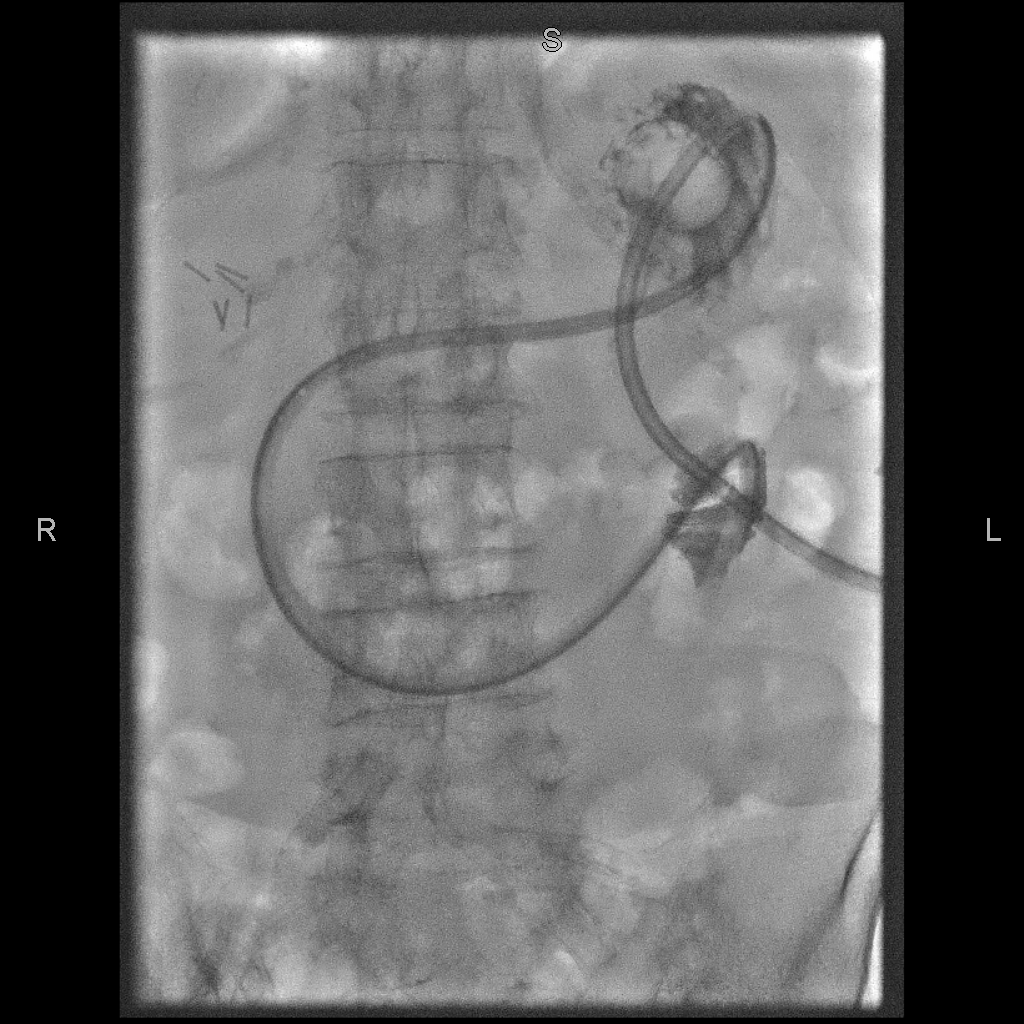

[1 of 1 positions shown; findings below may reference images not displayed]

EXAM:
GASTROJEJUNAL CATHETER REPLACEMENT

MEDICATIONS:
none indicated

ANESTHESIA/SEDATION:
None required

CONTRAST:  10mL OMNIPAQUE IOHEXOL 300 MG/ML SOLN - administered into
the gastric lumen.

FLUOROSCOPY TIME:  1 minute 36 seconds; 5 mGy

COMPLICATIONS:
None immediate.

PROCEDURE:
The previously placed GJ tube and surrounding skin were prepped and
draped in usual sterile fashion. Fluoroscopic inspection
demonstrated the short jejunal limb to be looped in the stomach. The
retention balloon was inflated and the catheter was removed over an
angled hydrophilic wire which was advanced to the ligament of
Treitz. With the aid of a 5 French angiographic catheter, the wire
was further advanced in the proximal jejunum. A new 22 French
large-bore dual lumen GJ tube was then advanced over the wire,
distal M position in the proximal jejunum, retention balloon
inflated in the stomach. Contrast injection of both lumens showed
good positioning and patency. The catheter was flushed and capped in
the external bumper applied. The patient tolerated the procedure
well.
IMPRESSION: 1. Technically successful exchange of 22 French dual lumen GJ
catheter, okay for routine use.

## 2023-11-01 IMAGING — XA IR PERC PLACEMENT DUOD/JEJ TUBE
1 series · 4 of 4 positions shown · non-contrast
Comparison: IR fluoroscopy, 03/23/2021

INDICATION: Malfunctioning tube.  Catheter leakage.

EXAM:
FLUOROSCOPIC GUIDED REPLACEMENT OF GASTROJEJUNOSTOMY TUBE

[2d screen save: ir radiologist eval & mg · 4 of 4 slices shown]
[im 1/4]
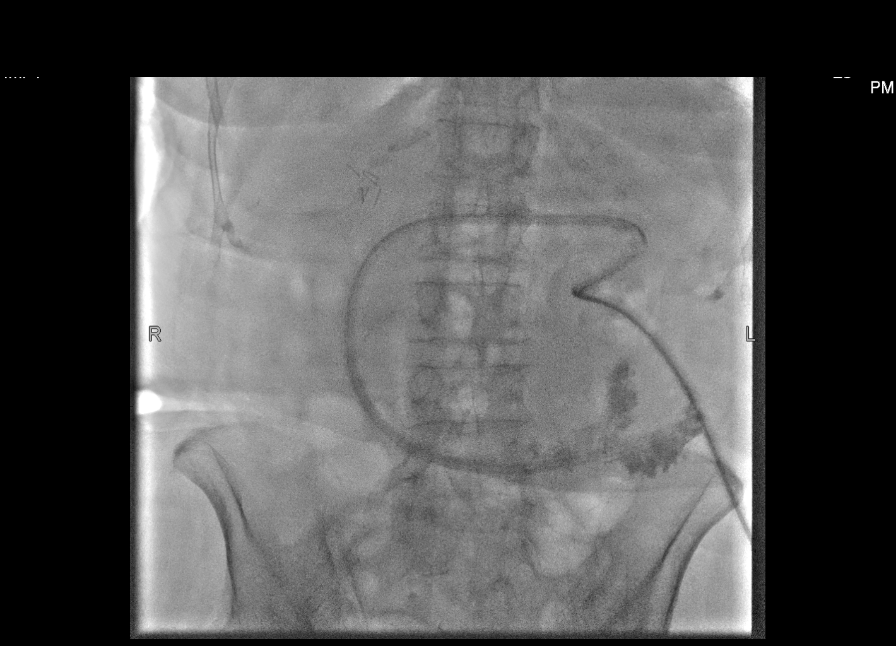
[im 2/4]
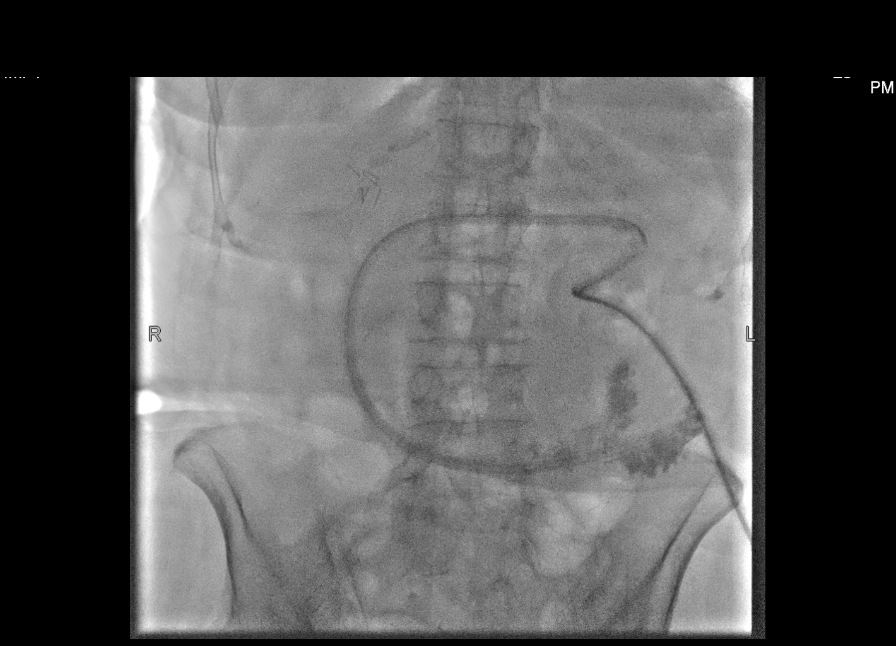
[im 3/4]
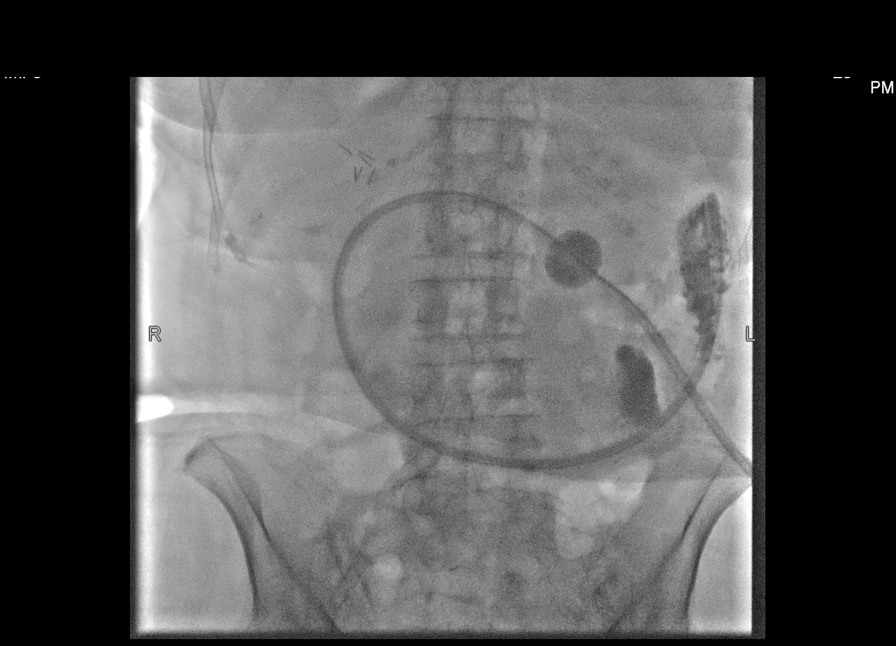
[im 4/4]
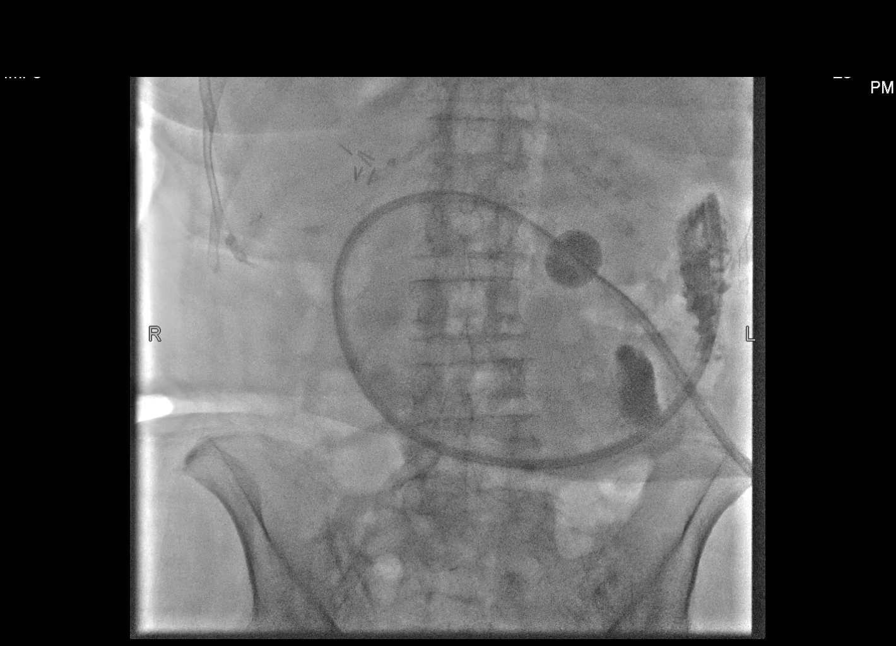

[4 of 4 positions shown; findings below may reference images not displayed]

MEDICATIONS:
None.

CONTRAST:  15mL OMNIPAQUE IOHEXOL 300 MG/ML  SOLN

FLUOROSCOPY TIME:  0 minutes 48 seconds.  1 mGy

COMPLICATIONS:
None.

PROCEDURE:
Informed written consent was obtained from the patient and/or
patient's representative after a discussion of the risks and
benefits. The upper abdomen and the external portion of the existing
gastrojejunostomy tube was prepped and draped in the usual sterile
fashion, and a sterile drape was applied covering the operative
field. Maximum barrier sterile technique with sterile gowns and
gloves were used for the procedure. A timeout was performed prior to
the initiation of the procedure.

Contrast injection confirmed appropriate positioning of the jejunal
limb. The jejunal lumen of the existing coaxial gastrojejunostomy
catheter was cannulated with a stiff Glidewire then the previous
catheter was removed. Under intermittent fluoroscopic guidance, a
new 22 Fr gastrojejunostomy catheter was advanced with tip
ultimately terminating within the proximal small bowel. Contrast
injection via the jejunostomy and gastric lumens confirmed
appropriate functioning and positioning.

A dressing was placed. The patient tolerated procedure well without
immediate postprocedural complication.
IMPRESSION: Successful fluoroscopic guided replacement of a 22 Fr
gastrojejunostomy catheter, as above.

The tip of the jejunostomy lumen lies within the proximal jejunum.
Both lumens ready for immediate use.

PLAN:
The patient is to return to [REDACTED] ([facility])
for routine GJ tube exchange within 6 months, or sooner if
warranted.

Line

## 2024-01-29 IMAGING — CT CT ABD-PELV W/O CM
2 of 4 series · 16 of 46 positions shown, 18 images · non-contrast
Comparison: 02/14/2021

CLINICAL DATA: Abdominal pain, acute, nonlocalized



[Series 3: a/p w/o 5mm · axial · non-contrast · 0.77mm/px · z∈[+790,+1194]mm · 13 of 89 slices shown, 15 images]
[im 4/89  soft-tissue]
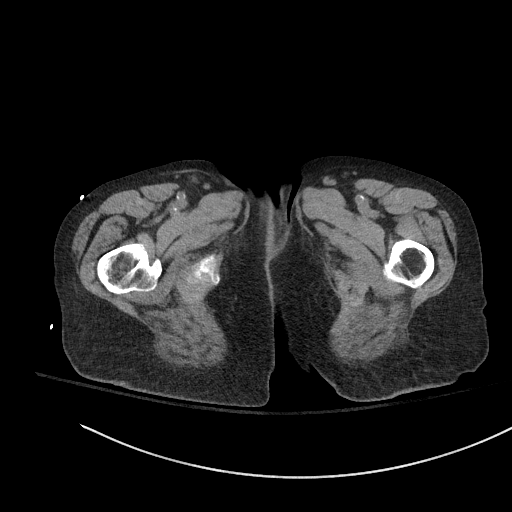
[im 4/89  bone]
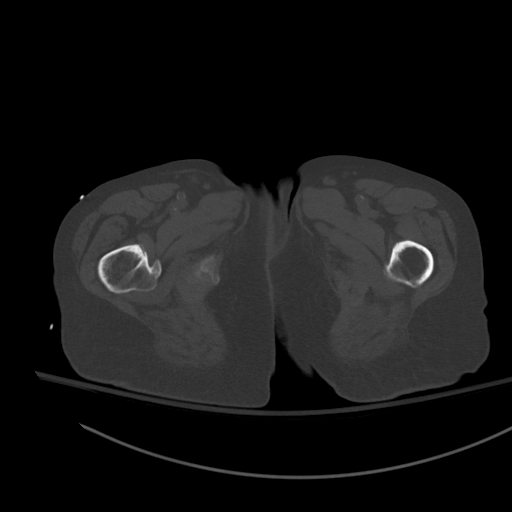
[im 12/89  soft-tissue]
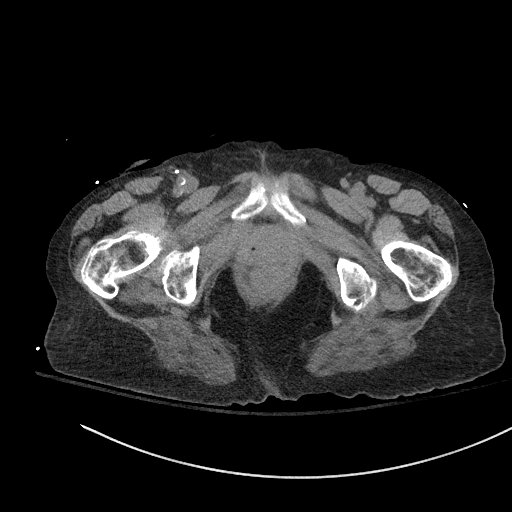
[im 19/89  soft-tissue]
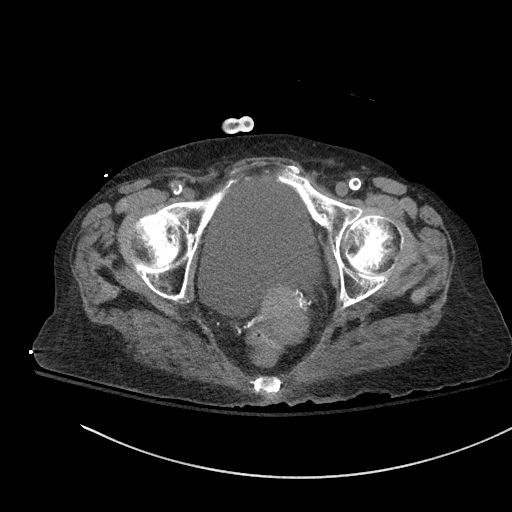
[im 26/89  soft-tissue]
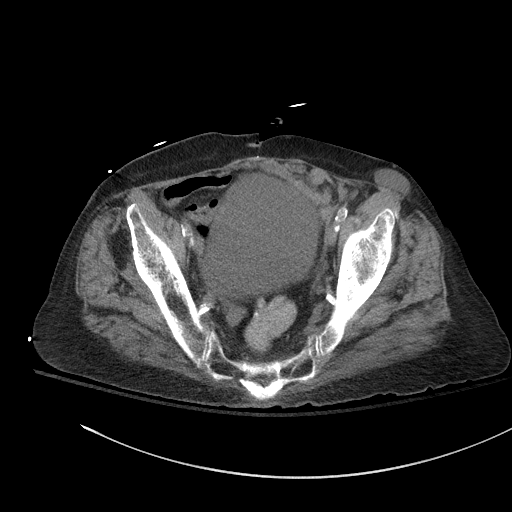
[im 30/89  soft-tissue]
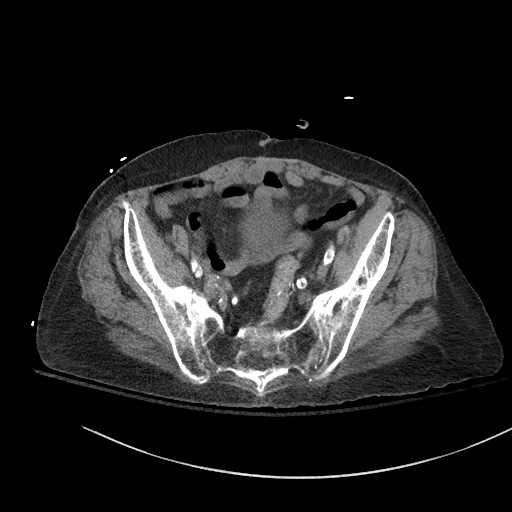
[im 37/89  soft-tissue]
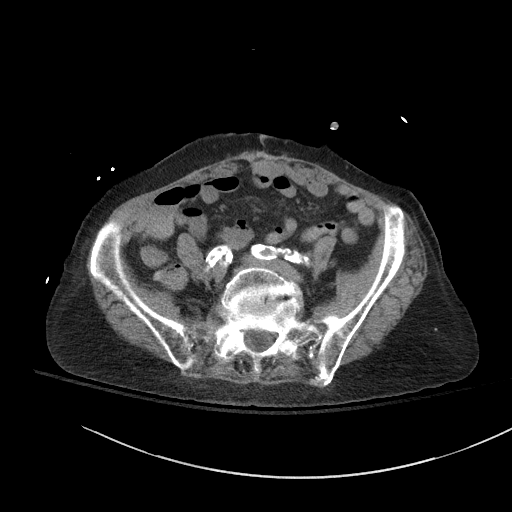
[im 45/89  soft-tissue]
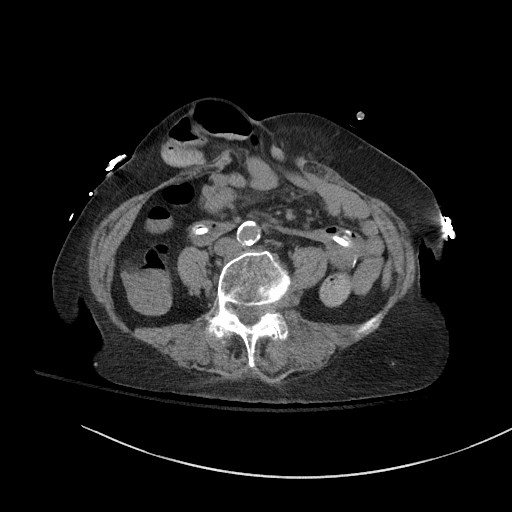
[im 52/89  soft-tissue]
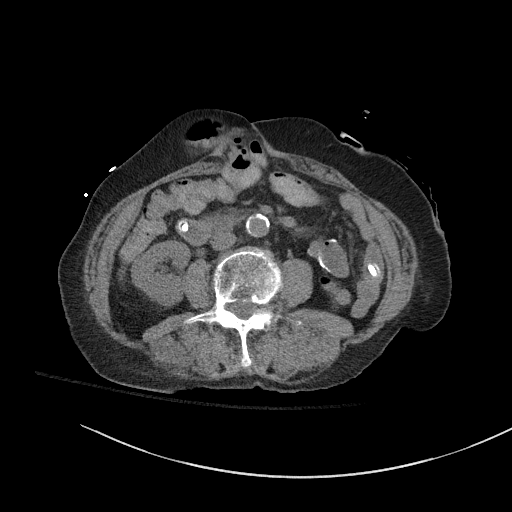
[im 59/89  soft-tissue]
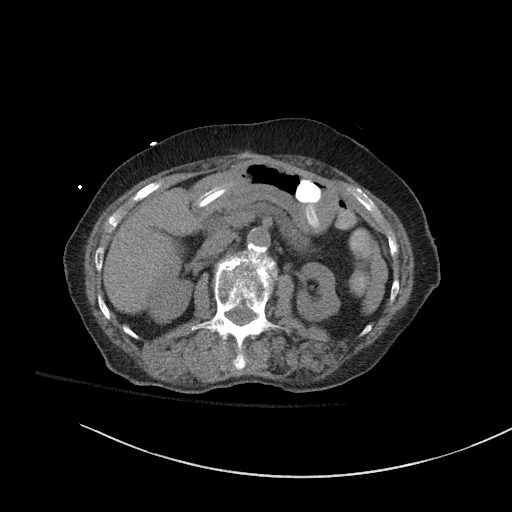
[im 59/89  bone]
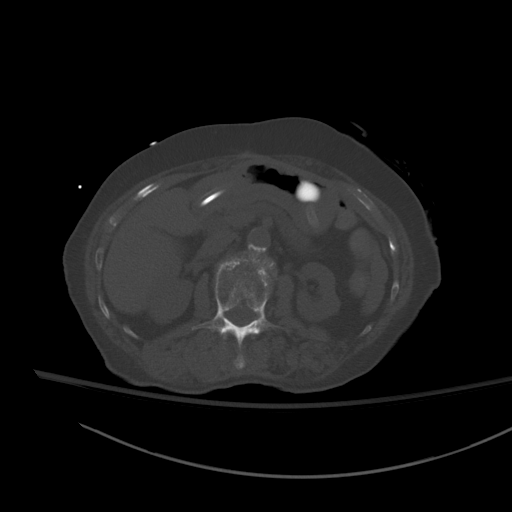
[im 63/89  soft-tissue]
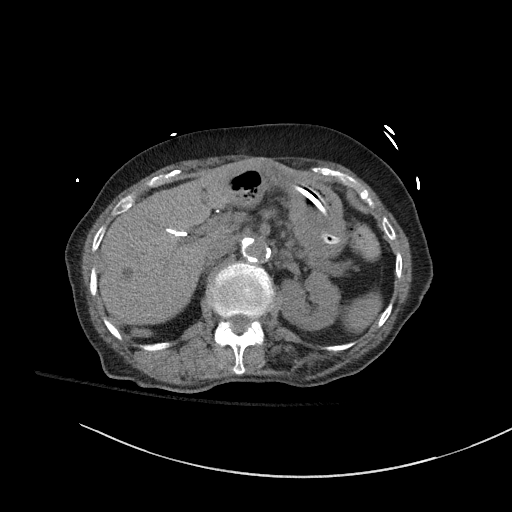
[im 70/89  soft-tissue]
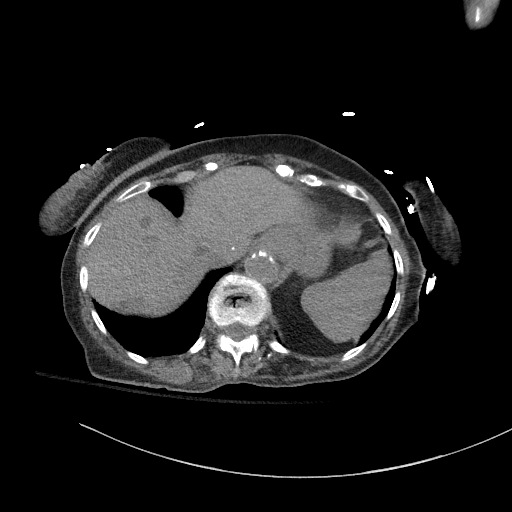
[im 78/89  soft-tissue]
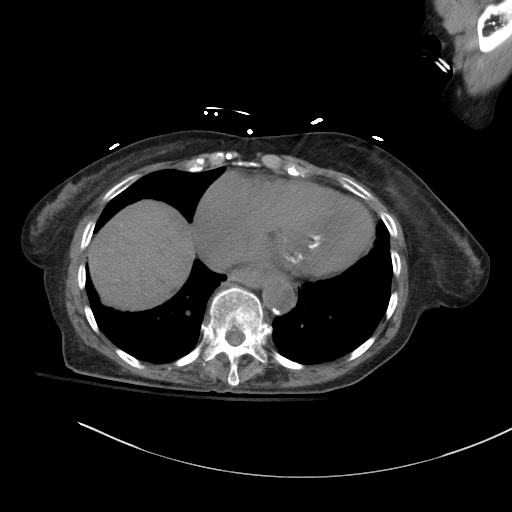
[im 85/89  soft-tissue]
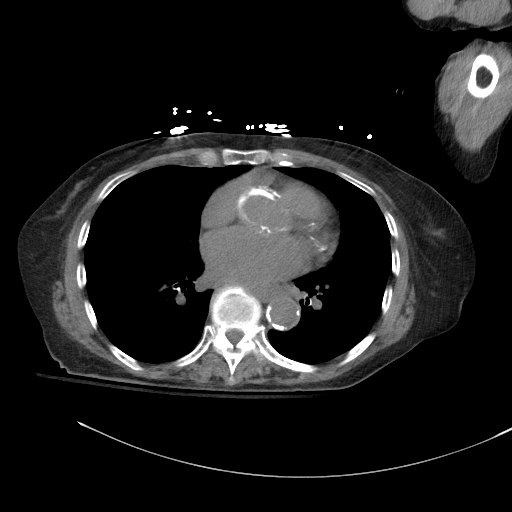

[Series 6: a/p w/o cor · coronal · non-contrast · 0.81mm/px · 3 of 122 slices shown]
[im 41/122  soft-tissue]
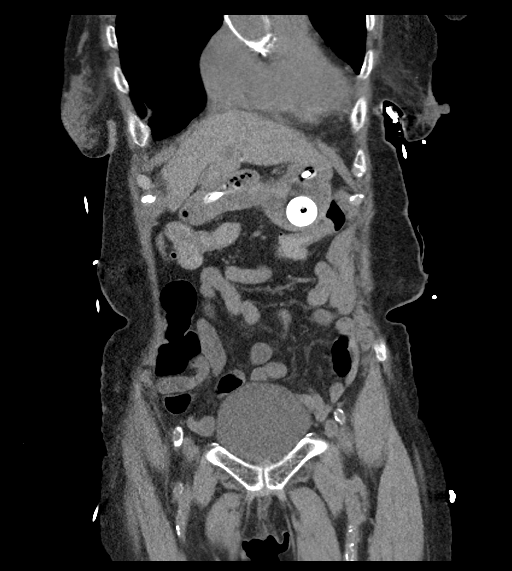
[im 54/122  soft-tissue]
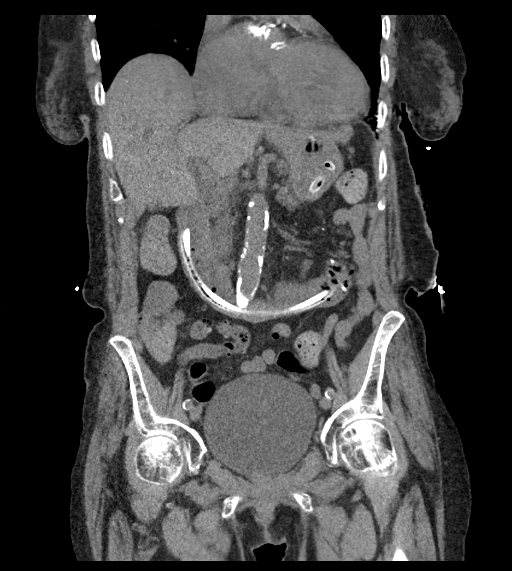
[im 68/122  soft-tissue]
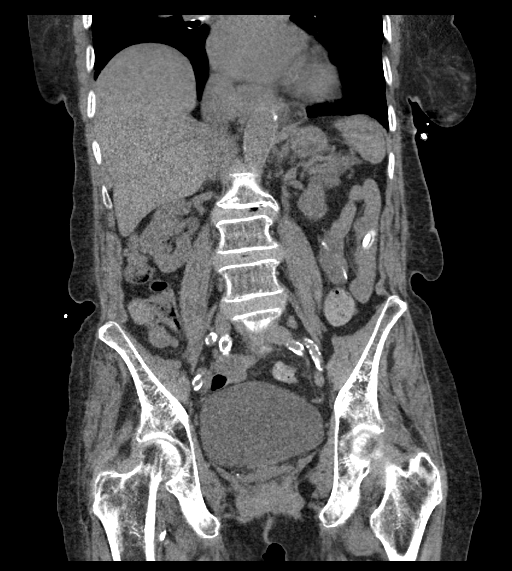

[16 of 46 positions shown; findings below may reference images not displayed]

FINDINGS: Lower chest: Heavily calcified aorta and visualized coronary
arteries. Heart is mildly enlarged. No acute abnormality.

Hepatobiliary: Scattered hypodensities in the liver are stable since
prior study, likely cysts. Prior cholecystectomy. No biliary ductal
dilatation.

Pancreas: No focal abnormality or ductal dilatation.

Spleen: No focal abnormality.  Normal size.

Adrenals/Urinary Tract: No adrenal abnormality. No focal renal
abnormality. No stones or hydronephrosis. Urinary bladder is
unremarkable.

Stomach/Bowel: Paraumbilical ventral hernia noted containing
transverse colon. No evidence of bowel obstruction.
Gastrojejunostomy tube in place. The retention balloon is in the
stomach. The tip is in the proximal jejunum. Stomach and small bowel
decompressed, unremarkable. Scattered colonic diverticula.

Vascular/Lymphatic: Heavily calcified aorta and iliac vessels. No
evidence of aneurysm or adenopathy.

Reproductive: Uterus and adnexa unremarkable.  No mass.

Other: No free fluid or free air. Adjacent to the paraumbilical
hernia containing the transverse colon, there is a left
paraumbilical hernia containing fat.

Musculoskeletal: No acute bony abnormality. Degenerative disc
disease throughout the lumbar spine. Mild compression fracture at
T11 is stable since prior study.
IMPRESSION: No acute findings in the abdomen or pelvis.

Paraumbilical hernia containing transverse colon. No bowel
obstruction. Adjacent separate periumbilical hernia containing fat.

Gastrojejunostomy tip is in the jejunum.

Aortoiliac atherosclerosis.

## 2024-01-30 IMAGING — XA IR REPLACE G/J TUBE W/ FLUORO
1 series · 1 of 1 positions shown · non-contrast
Comparison: none

INDICATION: Malfunctioning gastrojejunostomy tube

[Series 1: fl (-) angio · 1 of 1 slices shown]
[im 1/1]
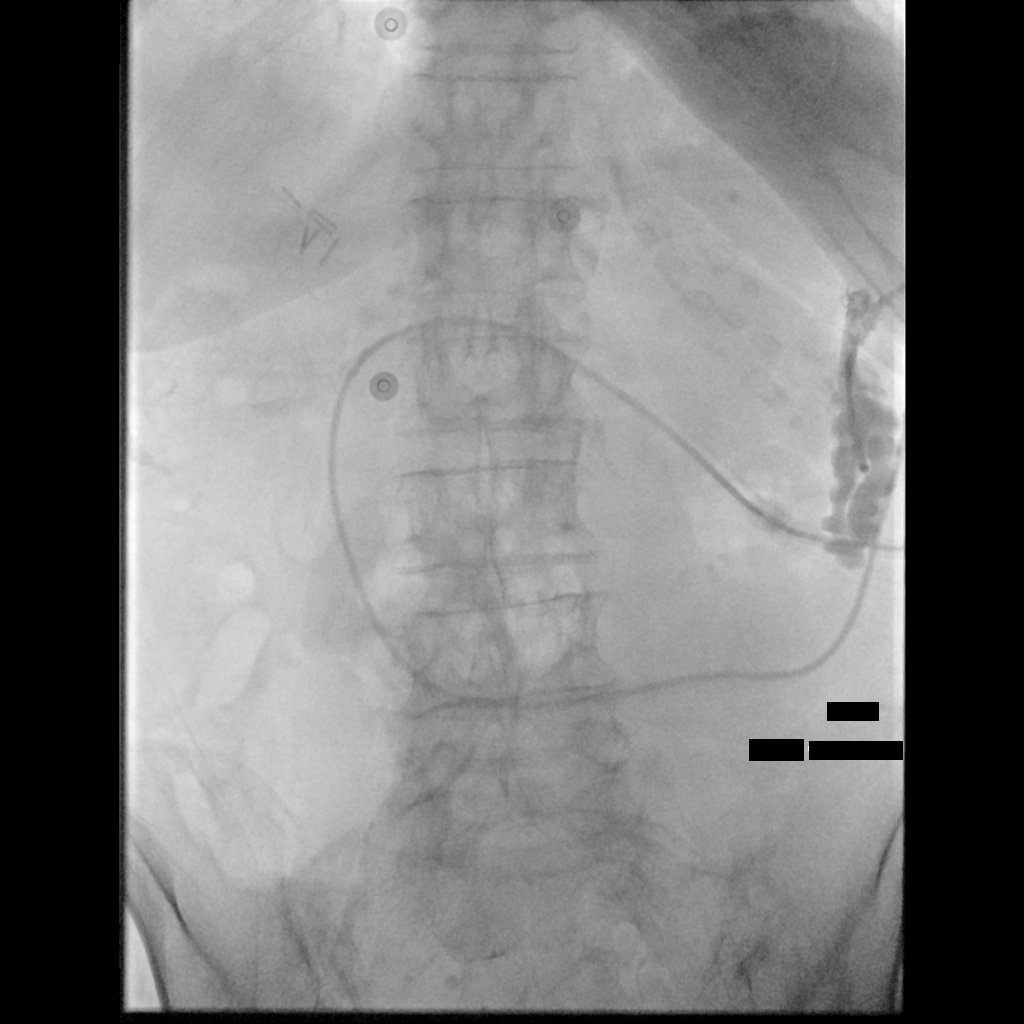

[1 of 1 positions shown; findings below may reference images not displayed]

EXAM:
Replacement of gastrojejunostomy tube using fluoroscopic guidance

MEDICATIONS:
None

ANESTHESIA/SEDATION:
None

CONTRAST:  5 mL-administered into the gastric lumen.

FLUOROSCOPY:
Radiation Exposure Index (as provided by the fluoroscopic device):
1.0 minutes (2 mGy)

COMPLICATIONS:
None immediate.

PROCEDURE:
Informed written consent was obtained from the patient after a
thorough discussion of the procedural risks, benefits and
alternatives. All questions were addressed. Maximal Sterile Barrier
Technique was utilized including caps, mask, sterile gowns, sterile
gloves, sterile drape, hand hygiene and skin antiseptic. A timeout
was performed prior to the initiation of the procedure.

Patient was placed supine on the exam table. Initial examination of
the abdomen demonstrated appropriate positioning of the existing
gastrojejunostomy catheter. Decision was made to proceed with
exchange due to reports malfunctioning leakage. Over a stiff
glidewire, the existing gastrojejunostomy catheter was removed after
deflating the retention balloon. Over this wire, a new 20 French
balloon retention gastrojejunostomy catheter was advanced such that
the distal tip terminating in the proximal small bowel. Injection of
contrast material confirmed location past the ligament of Treitz.
Retention balloon was inflated with approximately 10 mL of sterile
water and brought back to the gastric wall. The external bumper was
brought down to the skin. Both lumens were flushed with water, and a
clean dressing was placed. The patient tolerated the procedure well
without immediate complication.
IMPRESSION: Successful exchange of malfunctioning gastrojejunostomy tube for a
new 20 French gastrojejunostomy tube. Note that the patient's exact
22 French EnFit tube was not available due to inventory supply. The
patient may need to be scheduled for additional exchange if needed.

## 2024-01-30 IMAGING — DX DG CHEST 1V PORT
1 series · 1 of 1 positions shown · non-contrast
Comparison: Radiograph 12/28/2020

CLINICAL DATA: Shortness of breath

EXAM:
PORTABLE CHEST 1 VIEW

[chest]
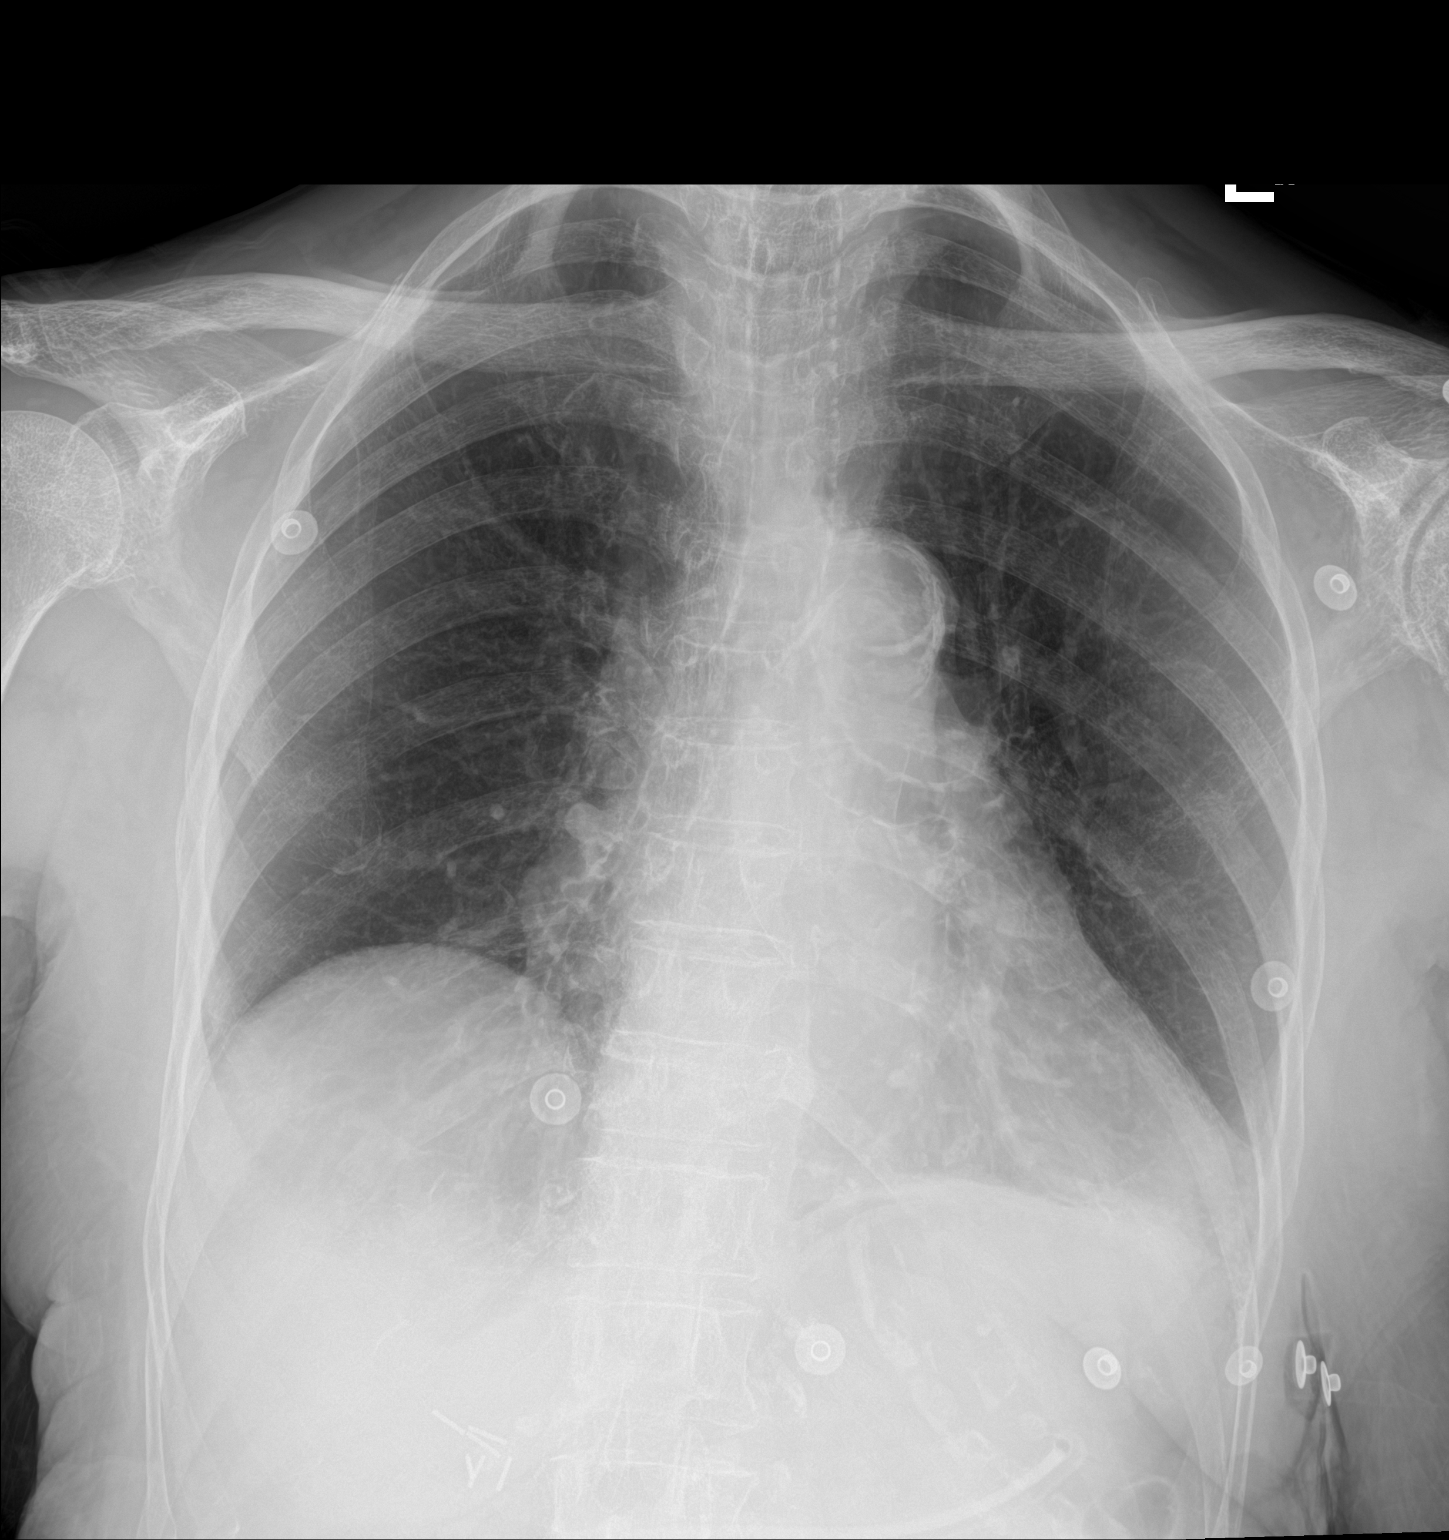

[1 of 1 positions shown; findings below may reference images not displayed]

FINDINGS: Unchanged cardiomediastinal silhouette. Aortic arch calcifications.
There is no focal airspace consolidation. There is no pleural
effusion. No pneumothorax. There is no acute osseous abnormality.
Right upper quadrant surgical clips. Percutaneous gastrostomy tube
overlies the stomach. Bilateral shoulder degenerative changes.
Thoracic spondylosis.
IMPRESSION: No evidence of acute cardiopulmonary disease.

## 2024-01-31 IMAGING — XA IR REPLACE G/J TUBE W/ FLUORO
4 series · 10 of 10 positions shown · non-contrast
Comparison: none

INDICATION: [AGE] woman with GJ tube returns to IR for exchange given
continued leaking around insertion site.

[Series 1: fl (-) angio · 2 of 2 slices shown (1 of 4)]
[im 1/2]
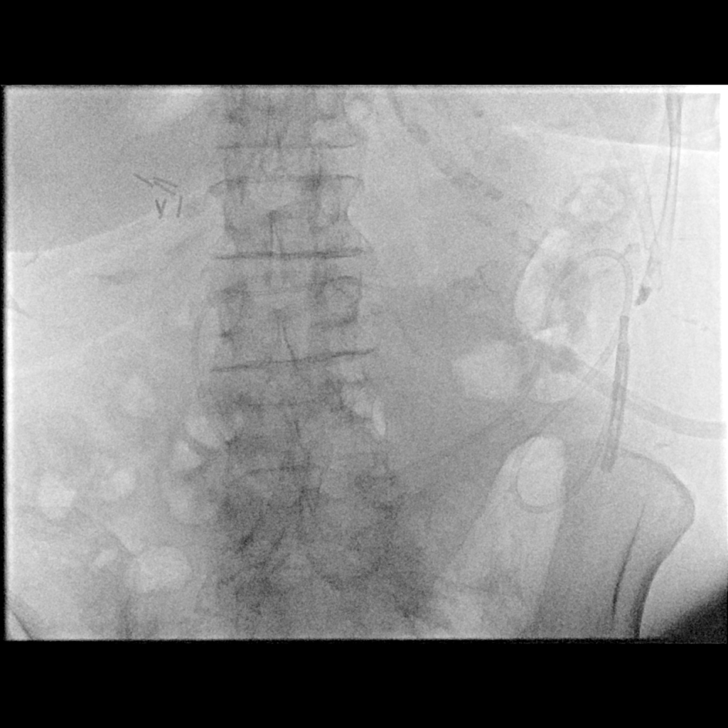
[im 2/2]
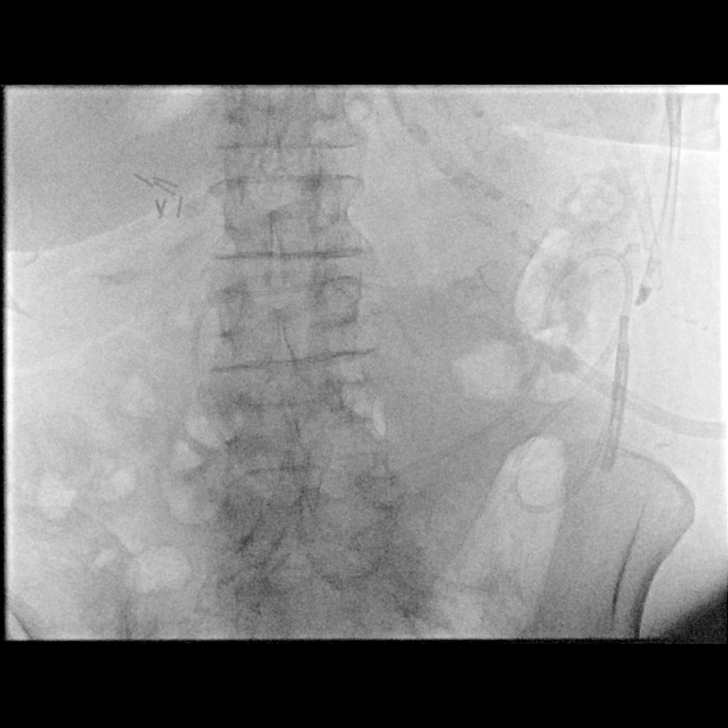

[Series 2: fl (-) angio · 2 of 2 slices shown (2 of 4)]
[im 1/2]
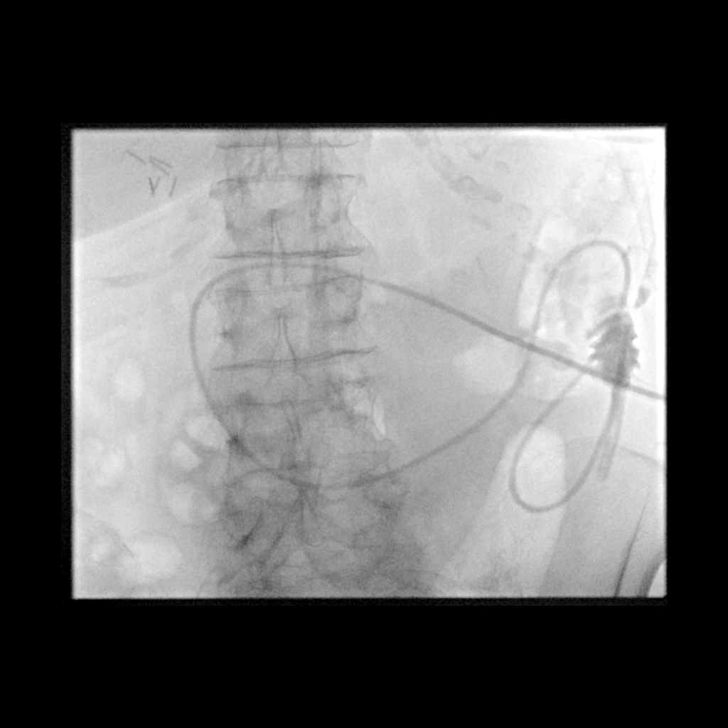
[im 2/2]
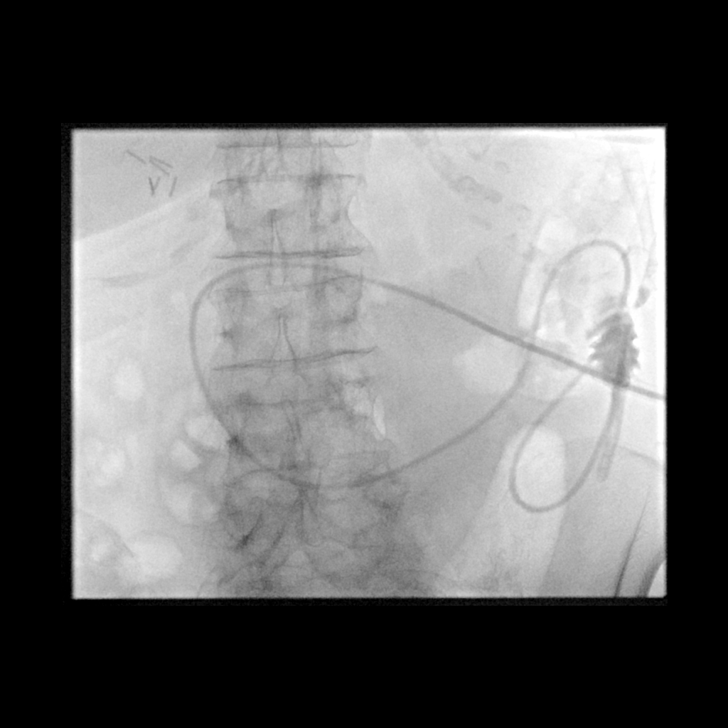

[Series 3: fl (-) angio · 2 of 2 slices shown (3 of 4)]
[im 1/2]
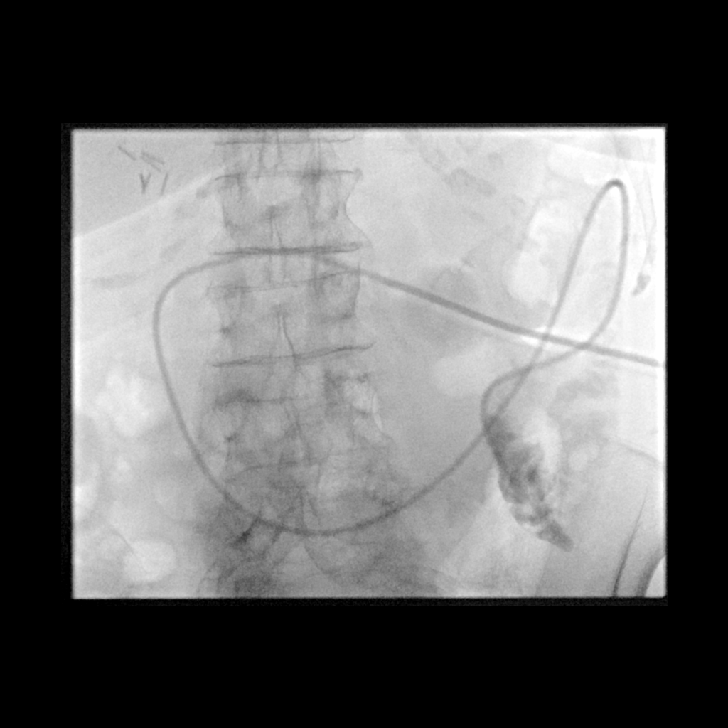
[im 2/2]
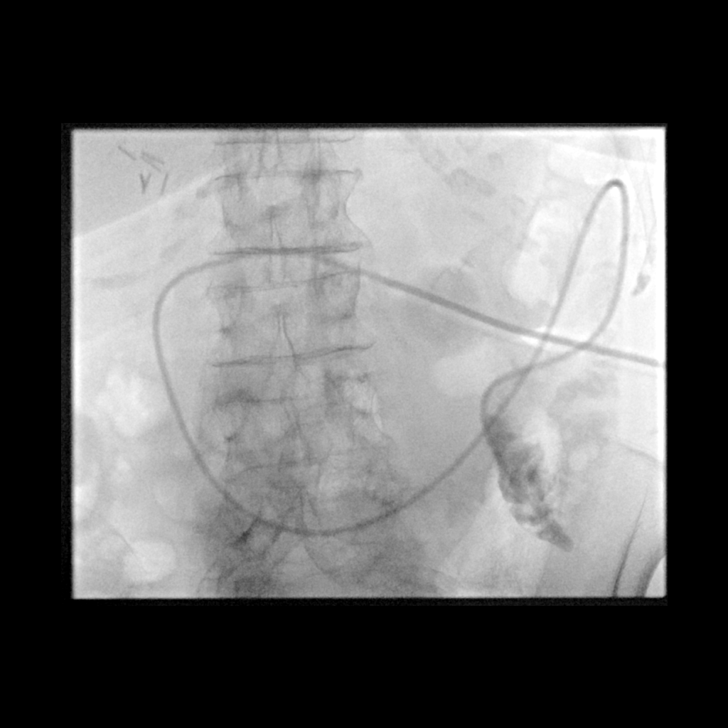

[Series 4: fl (-) angio · 2 acquisitions, 4 frames shown (4 of 4)]
[im 1/2]
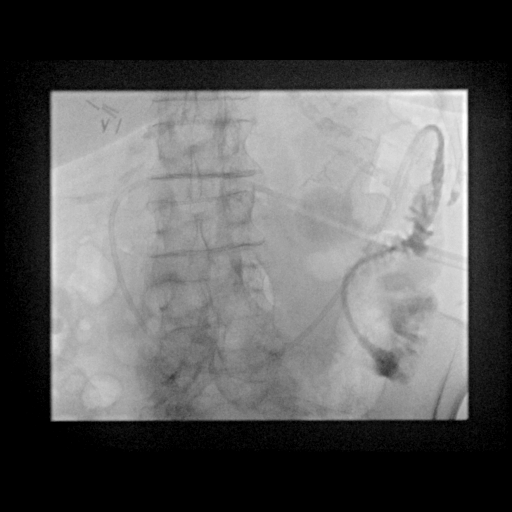
[im 1/2]
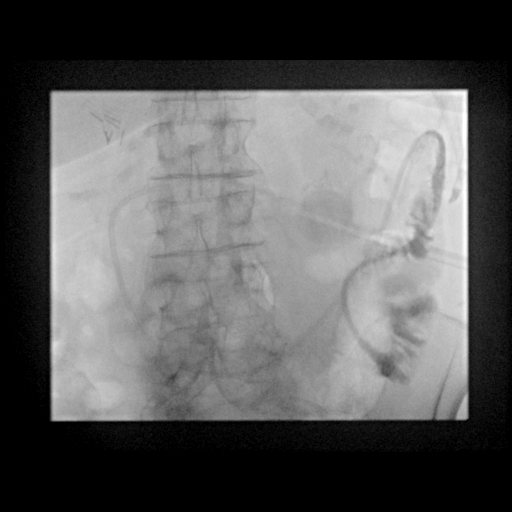
[im 1/2]
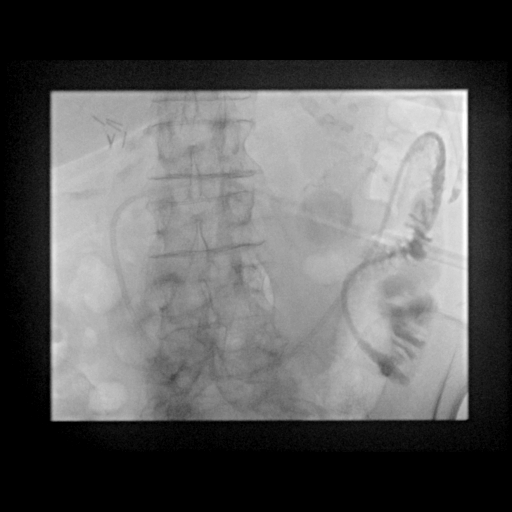
[im 2/2]
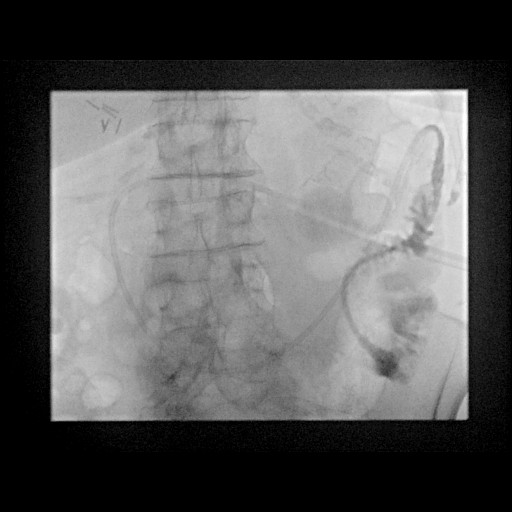

[10 of 10 positions shown; findings below may reference images not displayed]

EXAM:
Fluoroscopy guided GJ tube upsize

MEDICATIONS:
None

ANESTHESIA/SEDATION:
None

CONTRAST:  10 mL of Omnipaque 300-administered into the jejunal
lumen.

FLUOROSCOPY:
Radiation Exposure Index (as provided by the fluoroscopic device):
4.2 mGy Kerma

COMPLICATIONS:
None immediate.

PROCEDURE:
Informed written consent was obtained from the patient after a
thorough discussion of the procedural risks, benefits and
alternatives. All questions were addressed. Maximal Sterile Barrier
Technique was utilized including caps, mask, sterile gowns, sterile
gloves, sterile drape, hand hygiene and skin antiseptic. A timeout
was performed prior to the initiation of the procedure.

Scout image demonstrated existing 20 French GJ tube in appropriate
position, which was confirmed by administering contrast through the
jejunal limb under fluoroscopy.

The balloon was deflated and the existing feeding tube was removed
over stiff glidewire. New 24 French GJ tube was inserted over the
stiff Glidewire under fluoroscopy. The balloon was inflated with 10
mL of dilute contrast and retracted to the anterior gastric wall.

Contrast administered through the jejunal limb confirmed appropriate
positioning within the small bowel lumen. The feeding tube was
flushed and covered with sterile dressing.
IMPRESSION: 1. GJ tube upsized from 20 French to 24 French for treatment of
leakage around insertion site.
2. GJ tube is ready for use.
3. Jejunal limb should only be utilized for liquid medications or
feed. No crushed medication should be administered through jejunal
limb.
4. If there is leakage around gastric insertion site, consider
attaching gastric port to intermittent wall suction.

## 2024-02-06 IMAGING — XA IR REPLACE G/J TUBE W/ FLUORO
3 series · 12 of 21 positions shown · non-contrast
Comparison: IR fluoroscopy, 08/30/2021.

INDICATION: Catheter malfunction.  Leaking catheter.  Recent GJ exchange.

EXAM:
FLUOROSCOPIC GUIDED EXCHANGE OF GASTROJEJUNOSTOMY TUBE

[aw electronic film · 2 of 3 slices shown (1 of 3)]
[im 1/3]
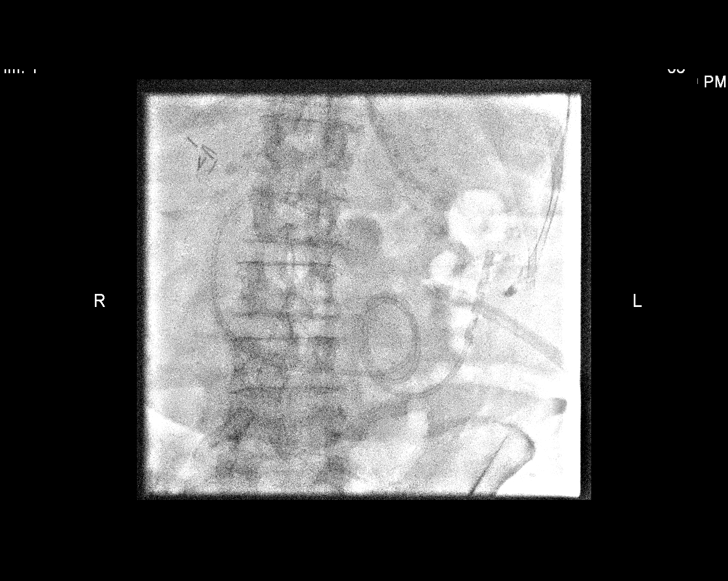
[im 3/3]
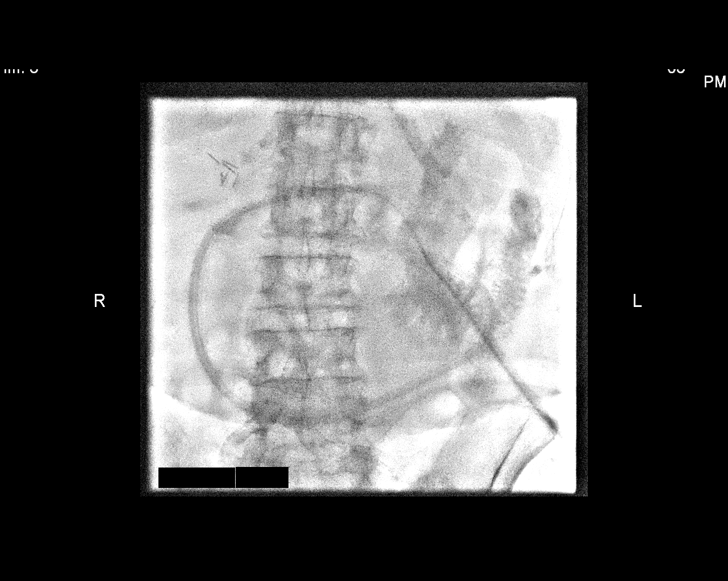

[aw electronic film · 4 of 8 slices shown (2 of 3)]
[im 2/8]
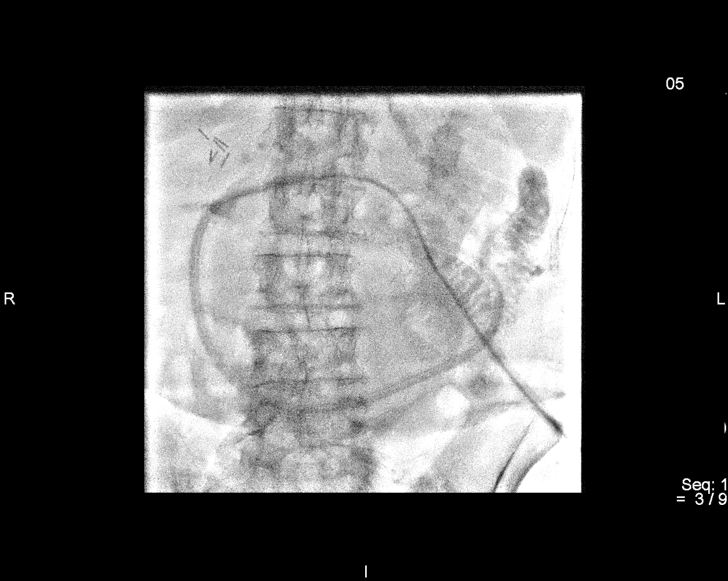
[im 4/8]
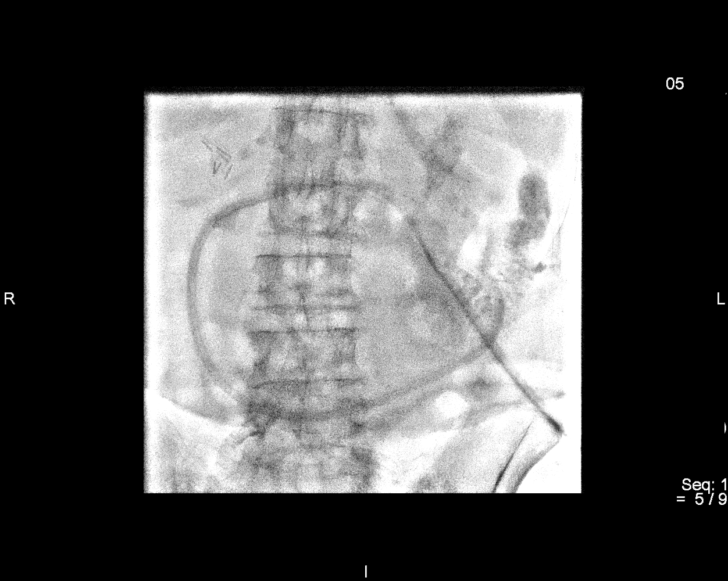
[im 5/8]
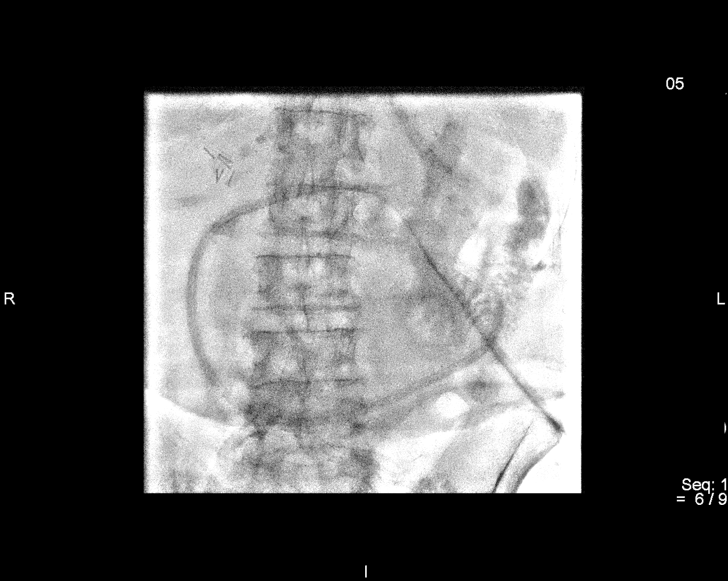
[im 7/8]
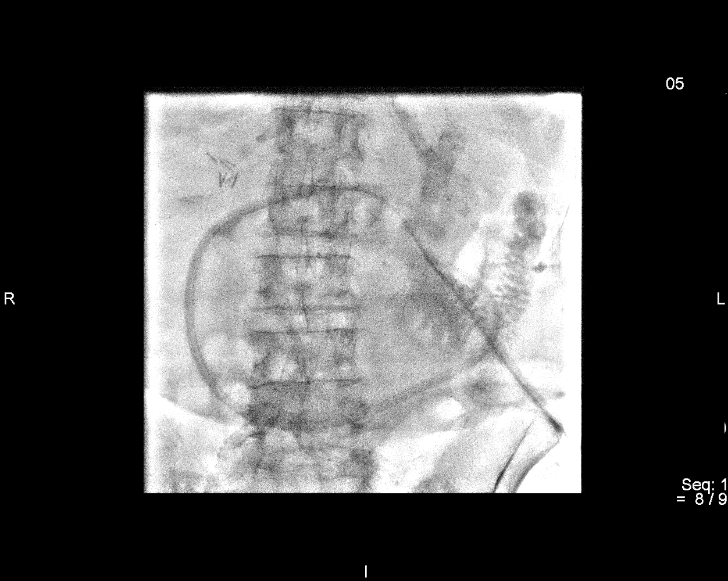

[aw electronic film · 6 of 10 slices shown (3 of 3)]
[im 1/10]
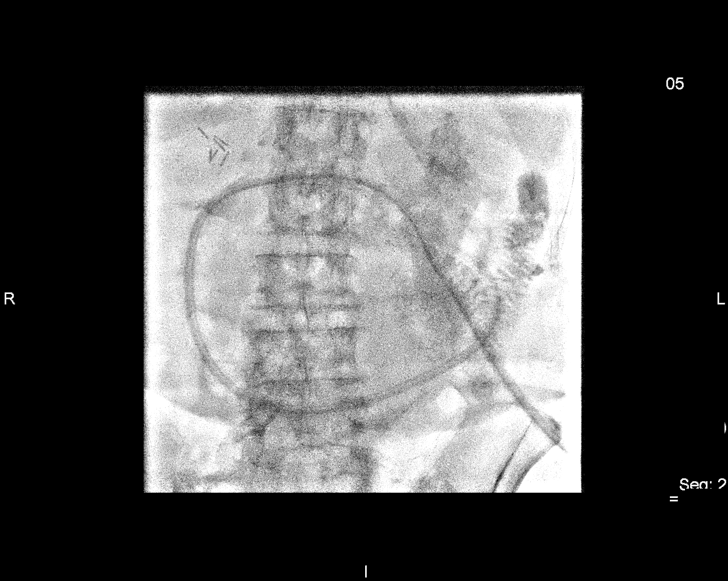
[im 3/10]
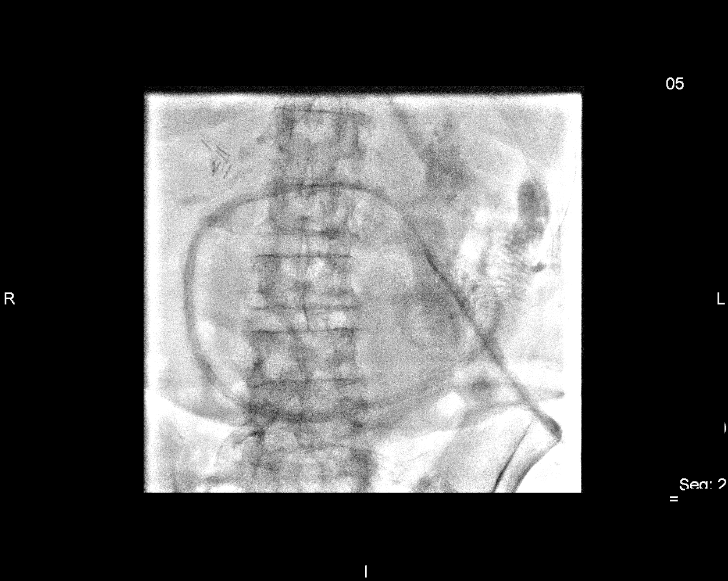
[im 4/10]
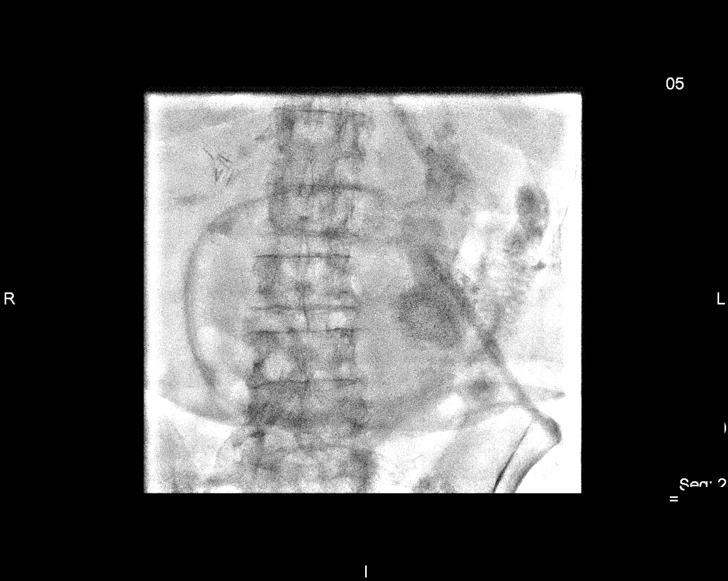
[im 6/10]
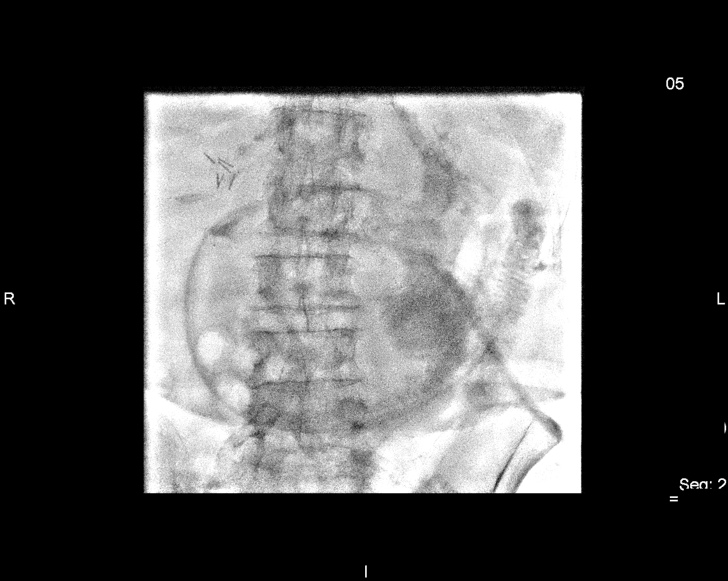
[im 8/10]
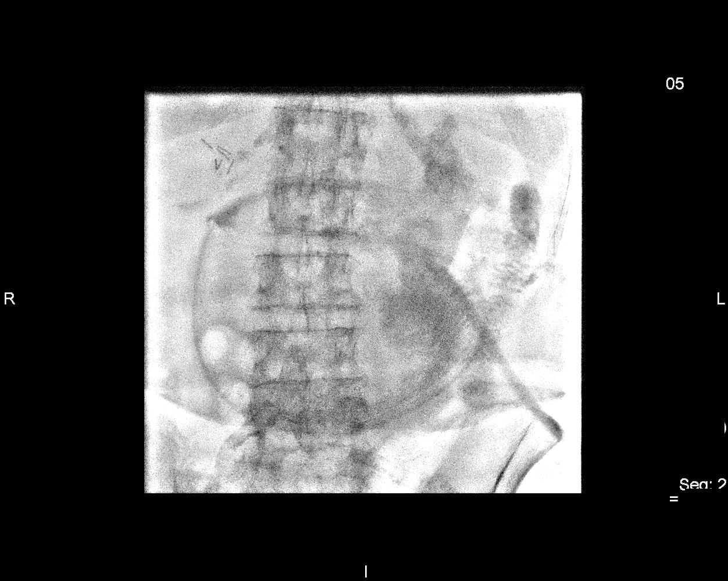
[im 10/10]
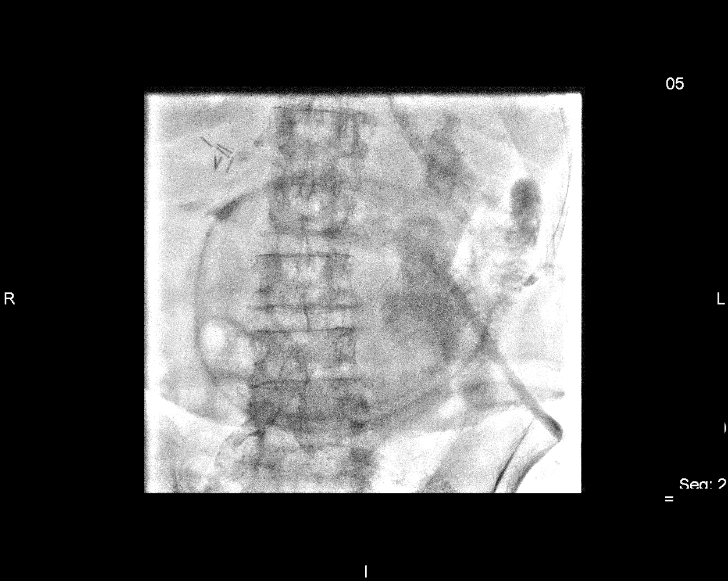

[12 of 21 positions shown; findings below may reference images not displayed]

CT [facility], 08/28/2021.

MEDICATIONS:
None.

CONTRAST:  15mL OMNIPAQUE IOHEXOL 300 MG/ML  SOLN

FLUOROSCOPY TIME:  Fluoroscopic dose; 2 mGy

COMPLICATIONS:
None.

PROCEDURE:
Informed written consent was obtained from the patient and/or
patient's representative after a discussion of the risks and
benefits. The upper abdomen and the external portion of the existing
gastrojejunostomy tube was prepped and draped in the usual sterile
fashion, and a sterile drape was applied covering the operative
field. Maximum barrier sterile technique with sterile gowns and
gloves were used for the procedure. A timeout was performed prior to
the initiation of the procedure.

Contrast injection of the existing catheter confirmed appropriate
positioning within the gastric and jejunal lumens. The jejunal lumen
of the existing coaxial 9-French gastrojejunostomy catheter was
cannulated with a stiff Glidewire, however was unable to be advanced
throughout the entirety of the jejunostomy lumen. A 0.035 inch Coons
wire was selected and advanced into the jejunal limb tip. The
feeding tube was then removed. Under intermittent fluoroscopic
guidance, a new new special hub type, large bore 22 Fr
gastrojejunostomy catheter was advanced with tip ultimately
terminating within the proximal small bowel. Contrast injection via
the jejunostomy and gastric lumens confirmed appropriate functioning
and positioning.

A dressing was placed. The patient tolerated procedure well without
immediate postprocedural complication.
IMPRESSION: Successful exchange for a new large bore (special hub type) 22 Fr
gastrojejunostomy catheter, as above.

The tip of the jejunostomy lumen lies within the proximal jejunum.
Both lumens ready for immediate use.

PLAN:
The patient is to return to [REDACTED] ([facility])
for routine gastrojejunostomy tube exchange in 6 months.
